# Patient Record
Sex: Female | Born: 1950
Health system: Southern US, Community
[De-identification: ages and names within clinical notes are randomized; demographics above are authoritative.]

## PROBLEM LIST (undated history)

## (undated) DIAGNOSIS — F32A Depression, unspecified: Secondary | ICD-10-CM

## (undated) DIAGNOSIS — I739 Peripheral vascular disease, unspecified: Secondary | ICD-10-CM

## (undated) DIAGNOSIS — I700 Atherosclerosis of aorta: Secondary | ICD-10-CM

## (undated) DIAGNOSIS — I10 Essential (primary) hypertension: Secondary | ICD-10-CM

## (undated) DIAGNOSIS — N139 Obstructive and reflux uropathy, unspecified: Secondary | ICD-10-CM

## (undated) DIAGNOSIS — G4730 Sleep apnea, unspecified: Secondary | ICD-10-CM

## (undated) DIAGNOSIS — I959 Hypotension, unspecified: Secondary | ICD-10-CM

## (undated) DIAGNOSIS — M419 Scoliosis, unspecified: Secondary | ICD-10-CM

## (undated) DIAGNOSIS — L97909 Non-pressure chronic ulcer of unspecified part of unspecified lower leg with unspecified severity: Principal | ICD-10-CM

## (undated) DIAGNOSIS — F329 Major depressive disorder, single episode, unspecified: Secondary | ICD-10-CM

## (undated) DIAGNOSIS — L98499 Non-pressure chronic ulcer of skin of other sites with unspecified severity: Secondary | ICD-10-CM

## (undated) DIAGNOSIS — R531 Weakness: Secondary | ICD-10-CM

## (undated) DIAGNOSIS — C8210 Follicular lymphoma grade II, unspecified site: Secondary | ICD-10-CM

## (undated) DIAGNOSIS — C8290 Follicular lymphoma, unspecified, unspecified site: Secondary | ICD-10-CM

## (undated) DIAGNOSIS — G709 Myoneural disorder, unspecified: Secondary | ICD-10-CM

## (undated) DIAGNOSIS — F909 Attention-deficit hyperactivity disorder, unspecified type: Secondary | ICD-10-CM

## (undated) DIAGNOSIS — G35 Multiple sclerosis: Secondary | ICD-10-CM

## (undated) DIAGNOSIS — J449 Chronic obstructive pulmonary disease, unspecified: Secondary | ICD-10-CM

## (undated) DIAGNOSIS — R609 Edema, unspecified: Secondary | ICD-10-CM

## (undated) DIAGNOSIS — R52 Pain, unspecified: Secondary | ICD-10-CM

## (undated) DIAGNOSIS — F419 Anxiety disorder, unspecified: Secondary | ICD-10-CM

## (undated) DIAGNOSIS — R0600 Dyspnea, unspecified: Secondary | ICD-10-CM

## (undated) HISTORY — DX: Scoliosis, unspecified: M41.9

## (undated) HISTORY — DX: Non-pressure chronic ulcer of unspecified part of unspecified lower leg with unspecified severity: L97.909

## (undated) HISTORY — PX: CYST EXCISION: SHX5701

## (undated) HISTORY — DX: Anxiety disorder, unspecified: F41.9

## (undated) HISTORY — DX: Atherosclerosis of aorta: I70.0

## (undated) HISTORY — DX: Follicular lymphoma grade II, unspecified site: C82.10

## (undated) HISTORY — DX: Chronic obstructive pulmonary disease, unspecified: J44.9

## (undated) HISTORY — DX: Follicular lymphoma, unspecified, unspecified site: C82.90

## (undated) HISTORY — DX: Major depressive disorder, single episode, unspecified: F32.9

## (undated) HISTORY — DX: Morbid (severe) obesity due to excess calories: E66.01

## (undated) HISTORY — PX: TONSILLECTOMY AND ADENOIDECTOMY: SUR1326

## (undated) HISTORY — DX: Depression, unspecified: F32.A

## (undated) HISTORY — PX: TUBAL LIGATION: SHX77

## (undated) HISTORY — DX: Essential (primary) hypertension: I10

## (undated) HISTORY — DX: Peripheral vascular disease, unspecified: I73.9

## (undated) HISTORY — DX: Myoneural disorder, unspecified: G70.9

## (undated) HISTORY — DX: Non-pressure chronic ulcer of skin of other sites with unspecified severity: L98.499

---

## 2000-04-29 HISTORY — PX: BACK SURGERY: SHX140

## 2000-12-23 ENCOUNTER — Inpatient Hospital Stay (HOSPITAL_COMMUNITY)
Admission: RE | Admit: 2000-12-23 | Discharge: 2000-12-26 | Payer: Self-pay | Admitting: Physical Medicine and Rehabilitation

## 2003-08-03 ENCOUNTER — Other Ambulatory Visit: Payer: Self-pay

## 2004-06-28 ENCOUNTER — Emergency Department: Payer: Self-pay | Admitting: Internal Medicine

## 2004-07-04 ENCOUNTER — Inpatient Hospital Stay: Payer: Self-pay

## 2006-08-21 ENCOUNTER — Emergency Department: Payer: Self-pay | Admitting: Emergency Medicine

## 2006-08-22 ENCOUNTER — Ambulatory Visit: Payer: Self-pay | Admitting: General Practice

## 2006-08-22 ENCOUNTER — Other Ambulatory Visit: Payer: Self-pay

## 2006-08-27 ENCOUNTER — Ambulatory Visit: Payer: Self-pay | Admitting: General Practice

## 2006-10-27 ENCOUNTER — Emergency Department: Payer: Self-pay | Admitting: Emergency Medicine

## 2006-10-30 ENCOUNTER — Encounter: Payer: Self-pay | Admitting: Internal Medicine

## 2006-11-28 ENCOUNTER — Encounter: Payer: Self-pay | Admitting: Internal Medicine

## 2006-12-29 ENCOUNTER — Encounter: Payer: Self-pay | Admitting: Internal Medicine

## 2007-01-28 ENCOUNTER — Encounter: Payer: Self-pay | Admitting: Internal Medicine

## 2007-06-05 ENCOUNTER — Ambulatory Visit: Payer: Self-pay | Admitting: Physician Assistant

## 2007-10-27 ENCOUNTER — Ambulatory Visit: Payer: Self-pay | Admitting: Internal Medicine

## 2008-01-25 ENCOUNTER — Ambulatory Visit: Payer: Self-pay

## 2008-03-09 ENCOUNTER — Encounter: Payer: Self-pay | Admitting: Internal Medicine

## 2008-03-29 ENCOUNTER — Encounter: Payer: Self-pay | Admitting: Internal Medicine

## 2008-11-17 ENCOUNTER — Ambulatory Visit: Payer: Self-pay | Admitting: Internal Medicine

## 2009-06-20 ENCOUNTER — Ambulatory Visit: Payer: Self-pay | Admitting: Urology

## 2010-01-10 ENCOUNTER — Ambulatory Visit: Payer: Self-pay | Admitting: Internal Medicine

## 2010-01-15 ENCOUNTER — Ambulatory Visit: Payer: Self-pay | Admitting: Internal Medicine

## 2010-01-17 ENCOUNTER — Ambulatory Visit: Payer: Self-pay | Admitting: Internal Medicine

## 2010-01-24 ENCOUNTER — Ambulatory Visit: Payer: Self-pay | Admitting: Internal Medicine

## 2010-01-31 ENCOUNTER — Ambulatory Visit: Payer: Self-pay | Admitting: Internal Medicine

## 2010-02-07 ENCOUNTER — Ambulatory Visit: Payer: Self-pay | Admitting: Internal Medicine

## 2010-02-15 ENCOUNTER — Ambulatory Visit: Payer: Self-pay

## 2010-02-21 ENCOUNTER — Ambulatory Visit: Payer: Self-pay | Admitting: Internal Medicine

## 2010-02-28 ENCOUNTER — Ambulatory Visit: Payer: Self-pay | Admitting: Internal Medicine

## 2010-03-09 ENCOUNTER — Ambulatory Visit: Payer: Self-pay | Admitting: Internal Medicine

## 2010-03-16 ENCOUNTER — Ambulatory Visit: Payer: Self-pay | Admitting: Internal Medicine

## 2010-03-21 ENCOUNTER — Ambulatory Visit: Payer: Self-pay

## 2010-03-29 ENCOUNTER — Ambulatory Visit: Payer: Self-pay | Admitting: Internal Medicine

## 2010-09-27 ENCOUNTER — Ambulatory Visit: Payer: Self-pay | Admitting: Urology

## 2010-12-03 ENCOUNTER — Ambulatory Visit: Payer: Self-pay | Admitting: Urology

## 2010-12-06 ENCOUNTER — Encounter: Payer: Self-pay | Admitting: Cardiothoracic Surgery

## 2010-12-06 ENCOUNTER — Encounter: Payer: Self-pay | Admitting: Nurse Practitioner

## 2010-12-29 ENCOUNTER — Encounter: Payer: Self-pay | Admitting: Cardiothoracic Surgery

## 2010-12-29 ENCOUNTER — Encounter: Payer: Self-pay | Admitting: Nurse Practitioner

## 2011-01-17 ENCOUNTER — Ambulatory Visit: Payer: Self-pay | Admitting: Internal Medicine

## 2011-01-28 ENCOUNTER — Encounter: Payer: Self-pay | Admitting: Cardiothoracic Surgery

## 2011-01-28 ENCOUNTER — Encounter: Payer: Self-pay | Admitting: Nurse Practitioner

## 2011-10-11 ENCOUNTER — Emergency Department: Payer: Self-pay | Admitting: Emergency Medicine

## 2011-10-11 ENCOUNTER — Ambulatory Visit: Payer: Self-pay | Admitting: Internal Medicine

## 2011-10-11 LAB — URINALYSIS, COMPLETE
Bacteria: NONE SEEN
Bacteria: NONE SEEN
Bilirubin,UR: NEGATIVE
Bilirubin,UR: NEGATIVE
Blood: NEGATIVE
Glucose,UR: NEGATIVE mg/dL (ref 0–75)
Glucose,UR: NEGATIVE mg/dL (ref 0–75)
Ketone: NEGATIVE
Ketone: NEGATIVE
Leukocyte Esterase: NEGATIVE
Leukocyte Esterase: NEGATIVE
Nitrite: NEGATIVE
Nitrite: NEGATIVE
Ph: 7 (ref 4.5–8.0)
Ph: 8 (ref 4.5–8.0)
Protein: NEGATIVE
Protein: 100
RBC,UR: <1 /HPF (ref 0–5)
RBC,UR: 10332 /HPF (ref 0–5)
Specific Gravity: 1.008 (ref 1.003–1.030)
Specific Gravity: 1.01 (ref 1.003–1.030)
Squamous Epithelial: 5
Squamous Epithelial: 12
WBC UR: 1 /HPF (ref 0–5)
WBC UR: NONE SEEN /HPF (ref 0–5)

## 2011-10-11 LAB — BASIC METABOLIC PANEL
Anion Gap: 7 (ref 7–16)
BUN: 31 mg/dL — ABNORMAL HIGH (ref 7–18)
Calcium, Total: 11.2 mg/dL — ABNORMAL HIGH (ref 8.5–10.1)
Chloride: 102 mmol/L (ref 98–107)
Co2: 30 mmol/L (ref 21–32)
Creatinine: 2.03 mg/dL — ABNORMAL HIGH (ref 0.60–1.30)
EGFR (African American): 30 — ABNORMAL LOW
EGFR (Non-African Amer.): 26 — ABNORMAL LOW
Glucose: 85 mg/dL (ref 65–99)
Osmolality: 283 (ref 275–301)
Potassium: 4.1 mmol/L (ref 3.5–5.1)
Sodium: 139 mmol/L (ref 136–145)

## 2011-10-11 LAB — CBC
HCT: 33.2 % — ABNORMAL LOW (ref 35.0–47.0)
HGB: 11 g/dL — ABNORMAL LOW (ref 12.0–16.0)
MCH: 29.7 pg (ref 26.0–34.0)
MCHC: 33.1 g/dL (ref 32.0–36.0)
MCV: 90 fL (ref 80–100)
Platelet: 206 10*3/uL (ref 150–440)
RBC: 3.7 10*6/uL — ABNORMAL LOW (ref 3.80–5.20)
RDW: 13.7 % (ref 11.5–14.5)
WBC: 7.5 10*3/uL (ref 3.6–11.0)

## 2011-11-26 DIAGNOSIS — M419 Scoliosis, unspecified: Secondary | ICD-10-CM | POA: Insufficient documentation

## 2011-11-26 DIAGNOSIS — O1003 Pre-existing essential hypertension complicating the puerperium: Secondary | ICD-10-CM | POA: Insufficient documentation

## 2011-11-26 DIAGNOSIS — M9979 Connective tissue and disc stenosis of intervertebral foramina of abdomen and other regions: Secondary | ICD-10-CM | POA: Insufficient documentation

## 2011-11-26 HISTORY — DX: Scoliosis, unspecified: M41.9

## 2012-01-10 ENCOUNTER — Emergency Department: Payer: Self-pay | Admitting: Emergency Medicine

## 2012-01-10 LAB — URINALYSIS, COMPLETE
Bacteria: NONE SEEN
Bilirubin,UR: NEGATIVE
Glucose,UR: NEGATIVE mg/dL (ref 0–75)
Ketone: NEGATIVE
Nitrite: NEGATIVE
Ph: 8 (ref 4.5–8.0)
Protein: NEGATIVE
RBC,UR: 2 /HPF (ref 0–5)
Specific Gravity: 1.004 (ref 1.003–1.030)
Squamous Epithelial: NONE SEEN
WBC UR: 7 /HPF (ref 0–5)

## 2012-01-13 ENCOUNTER — Ambulatory Visit: Payer: Self-pay | Admitting: Pain Medicine

## 2012-01-29 ENCOUNTER — Ambulatory Visit: Payer: Self-pay | Admitting: Pain Medicine

## 2012-02-12 DIAGNOSIS — N319 Neuromuscular dysfunction of bladder, unspecified: Secondary | ICD-10-CM | POA: Insufficient documentation

## 2012-02-12 DIAGNOSIS — N318 Other neuromuscular dysfunction of bladder: Secondary | ICD-10-CM | POA: Insufficient documentation

## 2012-02-12 DIAGNOSIS — R339 Retention of urine, unspecified: Secondary | ICD-10-CM | POA: Insufficient documentation

## 2012-02-20 ENCOUNTER — Ambulatory Visit: Payer: Self-pay | Admitting: Pain Medicine

## 2012-03-19 ENCOUNTER — Ambulatory Visit: Payer: Self-pay | Admitting: Pain Medicine

## 2012-04-01 ENCOUNTER — Ambulatory Visit: Payer: Self-pay | Admitting: Pain Medicine

## 2012-04-20 ENCOUNTER — Ambulatory Visit: Payer: Self-pay | Admitting: Pain Medicine

## 2012-05-01 ENCOUNTER — Ambulatory Visit: Payer: Self-pay | Admitting: Pain Medicine

## 2012-05-11 ENCOUNTER — Encounter: Payer: Self-pay | Admitting: Cardiothoracic Surgery

## 2012-05-11 ENCOUNTER — Encounter: Payer: Self-pay | Admitting: Nurse Practitioner

## 2012-05-13 ENCOUNTER — Ambulatory Visit: Payer: Self-pay | Admitting: Pain Medicine

## 2012-05-22 ENCOUNTER — Ambulatory Visit: Payer: Self-pay | Admitting: Neurology

## 2012-05-22 LAB — CREATININE, SERUM
Creatinine: 0.91 mg/dL (ref 0.60–1.30)
EGFR (African American): >60
EGFR (Non-African Amer.): >60

## 2012-05-30 ENCOUNTER — Encounter: Payer: Self-pay | Admitting: Cardiothoracic Surgery

## 2012-05-30 ENCOUNTER — Encounter: Payer: Self-pay | Admitting: Nurse Practitioner

## 2012-06-18 ENCOUNTER — Ambulatory Visit: Payer: Self-pay | Admitting: Pain Medicine

## 2012-06-27 ENCOUNTER — Encounter: Payer: Self-pay | Admitting: Nurse Practitioner

## 2012-06-29 ENCOUNTER — Ambulatory Visit: Payer: Self-pay | Admitting: Pain Medicine

## 2012-06-30 ENCOUNTER — Encounter: Payer: Self-pay | Admitting: Cardiothoracic Surgery

## 2012-07-23 ENCOUNTER — Ambulatory Visit: Payer: Self-pay | Admitting: Pain Medicine

## 2012-07-28 ENCOUNTER — Encounter: Payer: Self-pay | Admitting: Cardiothoracic Surgery

## 2012-07-28 ENCOUNTER — Encounter: Payer: Self-pay | Admitting: Nurse Practitioner

## 2012-08-03 ENCOUNTER — Ambulatory Visit: Payer: Self-pay | Admitting: Pain Medicine

## 2012-08-20 ENCOUNTER — Ambulatory Visit: Payer: Self-pay | Admitting: Pain Medicine

## 2012-08-27 ENCOUNTER — Encounter: Payer: Self-pay | Admitting: Nurse Practitioner

## 2012-08-27 ENCOUNTER — Encounter: Payer: Self-pay | Admitting: Cardiothoracic Surgery

## 2012-09-02 ENCOUNTER — Ambulatory Visit: Payer: Self-pay | Admitting: Pain Medicine

## 2012-09-17 ENCOUNTER — Ambulatory Visit: Payer: Self-pay | Admitting: Pain Medicine

## 2012-09-22 LAB — WOUND CULTURE

## 2012-09-25 DIAGNOSIS — R32 Unspecified urinary incontinence: Secondary | ICD-10-CM | POA: Insufficient documentation

## 2012-09-27 ENCOUNTER — Encounter: Payer: Self-pay | Admitting: Nurse Practitioner

## 2012-09-27 ENCOUNTER — Encounter: Payer: Self-pay | Admitting: Cardiothoracic Surgery

## 2012-10-07 ENCOUNTER — Ambulatory Visit: Payer: Self-pay | Admitting: Pain Medicine

## 2012-10-20 ENCOUNTER — Ambulatory Visit: Payer: Self-pay | Admitting: Pain Medicine

## 2012-10-27 ENCOUNTER — Encounter: Payer: Self-pay | Admitting: Nurse Practitioner

## 2012-10-27 ENCOUNTER — Encounter: Payer: Self-pay | Admitting: Cardiothoracic Surgery

## 2012-11-13 LAB — KOH PREP

## 2012-11-27 ENCOUNTER — Encounter: Payer: Self-pay | Admitting: Cardiothoracic Surgery

## 2012-11-27 ENCOUNTER — Encounter: Payer: Self-pay | Admitting: Nurse Practitioner

## 2013-02-25 ENCOUNTER — Inpatient Hospital Stay: Payer: Self-pay | Admitting: Internal Medicine

## 2013-02-25 LAB — URINALYSIS, COMPLETE
Bilirubin,UR: NEGATIVE
Blood: NEGATIVE
Glucose,UR: NEGATIVE mg/dL (ref 0–75)
Hyaline Cast: 4
Ketone: NEGATIVE
Nitrite: POSITIVE
Ph: 6 (ref 4.5–8.0)
Protein: 100
RBC,UR: 4 /HPF (ref 0–5)
Specific Gravity: 1.018 (ref 1.003–1.030)
Squamous Epithelial: 3
WBC UR: 411 /HPF (ref 0–5)

## 2013-02-25 LAB — COMPREHENSIVE METABOLIC PANEL
Albumin: 3.3 g/dL — ABNORMAL LOW (ref 3.4–5.0)
Alkaline Phosphatase: 88 U/L (ref 50–136)
Anion Gap: 6 — ABNORMAL LOW (ref 7–16)
BUN: 18 mg/dL (ref 7–18)
Bilirubin,Total: 0.4 mg/dL (ref 0.2–1.0)
Calcium, Total: 8.9 mg/dL (ref 8.5–10.1)
Chloride: 96 mmol/L — ABNORMAL LOW (ref 98–107)
Co2: 27 mmol/L (ref 21–32)
Creatinine: 1.62 mg/dL — ABNORMAL HIGH (ref 0.60–1.30)
EGFR (African American): 39 — ABNORMAL LOW
EGFR (Non-African Amer.): 34 — ABNORMAL LOW
Glucose: 119 mg/dL — ABNORMAL HIGH (ref 65–99)
Osmolality: 262 (ref 275–301)
Potassium: 3 mmol/L — ABNORMAL LOW (ref 3.5–5.1)
SGOT(AST): 20 U/L (ref 15–37)
SGPT (ALT): 18 U/L (ref 12–78)
Sodium: 129 mmol/L — ABNORMAL LOW (ref 136–145)
Total Protein: 7 g/dL (ref 6.4–8.2)

## 2013-02-25 LAB — CK TOTAL AND CKMB (NOT AT ARMC)
CK, Total: 142 U/L (ref 21–215)
CK-MB: 1.7 ng/mL (ref 0.5–3.6)

## 2013-02-25 LAB — CBC
HCT: 36 % (ref 35.0–47.0)
HGB: 12.3 g/dL (ref 12.0–16.0)
MCH: 29.5 pg (ref 26.0–34.0)
MCHC: 34.2 g/dL (ref 32.0–36.0)
MCV: 86 fL (ref 80–100)
Platelet: 204 10*3/uL (ref 150–440)
RBC: 4.18 10*6/uL (ref 3.80–5.20)
RDW: 14.5 % (ref 11.5–14.5)
WBC: 13 10*3/uL — ABNORMAL HIGH (ref 3.6–11.0)

## 2013-02-25 LAB — TROPONIN I: Troponin-I: <0.02 ng/mL

## 2013-02-26 LAB — BASIC METABOLIC PANEL
Anion Gap: 6 — ABNORMAL LOW (ref 7–16)
BUN: 18 mg/dL (ref 7–18)
Calcium, Total: 8.9 mg/dL (ref 8.5–10.1)
Chloride: 102 mmol/L (ref 98–107)
Co2: 24 mmol/L (ref 21–32)
Creatinine: 1.21 mg/dL (ref 0.60–1.30)
EGFR (African American): 56 — ABNORMAL LOW
EGFR (Non-African Amer.): 48 — ABNORMAL LOW
Glucose: 145 mg/dL — ABNORMAL HIGH (ref 65–99)
Osmolality: 269 (ref 275–301)
Potassium: 3.5 mmol/L (ref 3.5–5.1)
Sodium: 132 mmol/L — ABNORMAL LOW (ref 136–145)

## 2013-02-26 LAB — CBC WITH DIFFERENTIAL/PLATELET
Basophil #: 0 10*3/uL (ref 0.0–0.1)
Basophil %: 0.1 %
Eosinophil #: 0 10*3/uL (ref 0.0–0.7)
Eosinophil %: 0 %
HCT: 35.5 % (ref 35.0–47.0)
HGB: 12.3 g/dL (ref 12.0–16.0)
Lymphocyte #: 0.7 10*3/uL — ABNORMAL LOW (ref 1.0–3.6)
Lymphocyte %: 7.4 %
MCH: 29.8 pg (ref 26.0–34.0)
MCHC: 34.7 g/dL (ref 32.0–36.0)
MCV: 86 fL (ref 80–100)
Monocyte #: 0.1 x10 3/mm — ABNORMAL LOW (ref 0.2–0.9)
Monocyte %: 1.2 %
Neutrophil #: 8.3 10*3/uL — ABNORMAL HIGH (ref 1.4–6.5)
Neutrophil %: 91.3 %
Platelet: 204 10*3/uL (ref 150–440)
RBC: 4.13 10*6/uL (ref 3.80–5.20)
RDW: 14.5 % (ref 11.5–14.5)
WBC: 9.1 10*3/uL (ref 3.6–11.0)

## 2013-02-27 ENCOUNTER — Ambulatory Visit: Payer: Self-pay | Admitting: Neurology

## 2013-02-27 LAB — BASIC METABOLIC PANEL
Anion Gap: 7 (ref 7–16)
BUN: 23 mg/dL — ABNORMAL HIGH (ref 7–18)
Calcium, Total: 8.9 mg/dL (ref 8.5–10.1)
Chloride: 103 mmol/L (ref 98–107)
Co2: 23 mmol/L (ref 21–32)
Creatinine: 1.13 mg/dL (ref 0.60–1.30)
EGFR (African American): >60
EGFR (Non-African Amer.): 52 — ABNORMAL LOW
Glucose: 141 mg/dL — ABNORMAL HIGH (ref 65–99)
Osmolality: 272 (ref 275–301)
Potassium: 3.5 mmol/L (ref 3.5–5.1)
Sodium: 133 mmol/L — ABNORMAL LOW (ref 136–145)

## 2013-02-27 LAB — URINE CULTURE

## 2013-02-28 LAB — CBC WITH DIFFERENTIAL/PLATELET
Basophil #: 0.1 10*3/uL (ref 0.0–0.1)
Basophil %: 0.5 %
Eosinophil #: 0 10*3/uL (ref 0.0–0.7)
Eosinophil %: 0.1 %
HCT: 37.1 % (ref 35.0–47.0)
HGB: 12.8 g/dL (ref 12.0–16.0)
Lymphocyte #: 2.5 10*3/uL (ref 1.0–3.6)
Lymphocyte %: 22.6 %
MCH: 30 pg (ref 26.0–34.0)
MCHC: 34.4 g/dL (ref 32.0–36.0)
MCV: 87 fL (ref 80–100)
Monocyte #: 0.8 x10 3/mm (ref 0.2–0.9)
Monocyte %: 7.1 %
Neutrophil #: 7.8 10*3/uL — ABNORMAL HIGH (ref 1.4–6.5)
Neutrophil %: 69.7 %
Platelet: 247 10*3/uL (ref 150–440)
RBC: 4.26 10*6/uL (ref 3.80–5.20)
RDW: 14.8 % — ABNORMAL HIGH (ref 11.5–14.5)
WBC: 11.2 10*3/uL — ABNORMAL HIGH (ref 3.6–11.0)

## 2013-02-28 LAB — COMPREHENSIVE METABOLIC PANEL
Albumin: 3.1 g/dL — ABNORMAL LOW (ref 3.4–5.0)
Alkaline Phosphatase: 84 U/L (ref 50–136)
Anion Gap: 5 — ABNORMAL LOW (ref 7–16)
BUN: 22 mg/dL — ABNORMAL HIGH (ref 7–18)
Bilirubin,Total: 0.3 mg/dL (ref 0.2–1.0)
Calcium, Total: 8.7 mg/dL (ref 8.5–10.1)
Chloride: 105 mmol/L (ref 98–107)
Co2: 26 mmol/L (ref 21–32)
Creatinine: 1.08 mg/dL (ref 0.60–1.30)
EGFR (African American): >60
EGFR (Non-African Amer.): 55 — ABNORMAL LOW
Glucose: 74 mg/dL (ref 65–99)
Osmolality: 274 (ref 275–301)
Potassium: 3.4 mmol/L — ABNORMAL LOW (ref 3.5–5.1)
SGOT(AST): 10 U/L — ABNORMAL LOW (ref 15–37)
SGPT (ALT): 22 U/L (ref 12–78)
Sodium: 136 mmol/L (ref 136–145)
Total Protein: 6.8 g/dL (ref 6.4–8.2)

## 2013-05-07 ENCOUNTER — Encounter: Payer: Self-pay | Admitting: Surgery

## 2013-05-11 LAB — WOUND CULTURE

## 2013-05-30 ENCOUNTER — Encounter: Payer: Self-pay | Admitting: Surgery

## 2013-06-27 ENCOUNTER — Encounter: Payer: Self-pay | Admitting: Surgery

## 2013-07-14 ENCOUNTER — Emergency Department: Payer: Self-pay | Admitting: Internal Medicine

## 2013-07-14 LAB — CBC WITH DIFFERENTIAL/PLATELET
Basophil #: 0.1 10*3/uL (ref 0.0–0.1)
Basophil %: 1 %
Eosinophil #: 0.2 10*3/uL (ref 0.0–0.7)
Eosinophil %: 2.7 %
HCT: 40.8 % (ref 35.0–47.0)
HGB: 14.2 g/dL (ref 12.0–16.0)
Lymphocyte #: 1.5 10*3/uL (ref 1.0–3.6)
Lymphocyte %: 16.4 %
MCH: 30.2 pg (ref 26.0–34.0)
MCHC: 34.8 g/dL (ref 32.0–36.0)
MCV: 87 fL (ref 80–100)
Monocyte #: 0.7 x10 3/mm (ref 0.2–0.9)
Monocyte %: 8.4 %
Neutrophil #: 6.3 10*3/uL (ref 1.4–6.5)
Neutrophil %: 71.5 %
Platelet: 242 10*3/uL (ref 150–440)
RBC: 4.71 10*6/uL (ref 3.80–5.20)
RDW: 14.2 % (ref 11.5–14.5)
WBC: 8.9 10*3/uL (ref 3.6–11.0)

## 2013-07-14 LAB — BASIC METABOLIC PANEL
Anion Gap: 3 — ABNORMAL LOW (ref 7–16)
BUN: 13 mg/dL (ref 7–18)
Calcium, Total: 9.4 mg/dL (ref 8.5–10.1)
Chloride: 94 mmol/L — ABNORMAL LOW (ref 98–107)
Co2: 32 mmol/L (ref 21–32)
Creatinine: 1.03 mg/dL (ref 0.60–1.30)
EGFR (African American): >60
EGFR (Non-African Amer.): 58 — ABNORMAL LOW
Glucose: 91 mg/dL (ref 65–99)
Osmolality: 259 (ref 275–301)
Potassium: 3.5 mmol/L (ref 3.5–5.1)
Sodium: 129 mmol/L — ABNORMAL LOW (ref 136–145)

## 2013-08-01 ENCOUNTER — Emergency Department: Payer: Self-pay | Admitting: Emergency Medicine

## 2013-08-01 LAB — CBC
HCT: 40.2 % (ref 35.0–47.0)
HGB: 13.6 g/dL (ref 12.0–16.0)
MCH: 29.4 pg (ref 26.0–34.0)
MCHC: 33.8 g/dL (ref 32.0–36.0)
MCV: 87 fL (ref 80–100)
Platelet: 274 10*3/uL (ref 150–440)
RBC: 4.62 10*6/uL (ref 3.80–5.20)
RDW: 14.2 % (ref 11.5–14.5)
WBC: 9.2 10*3/uL (ref 3.6–11.0)

## 2013-08-01 LAB — COMPREHENSIVE METABOLIC PANEL
Albumin: 4.1 g/dL (ref 3.4–5.0)
Alkaline Phosphatase: 108 U/L
Anion Gap: 6 — ABNORMAL LOW (ref 7–16)
BUN: 15 mg/dL (ref 7–18)
Bilirubin,Total: 0.3 mg/dL (ref 0.2–1.0)
Calcium, Total: 8.9 mg/dL (ref 8.5–10.1)
Chloride: 90 mmol/L — ABNORMAL LOW (ref 98–107)
Co2: 29 mmol/L (ref 21–32)
Creatinine: 1.18 mg/dL (ref 0.60–1.30)
EGFR (African American): 57 — ABNORMAL LOW
EGFR (Non-African Amer.): 49 — ABNORMAL LOW
Glucose: 98 mg/dL (ref 65–99)
Osmolality: 252 (ref 275–301)
Potassium: 3.5 mmol/L (ref 3.5–5.1)
SGOT(AST): 28 U/L (ref 15–37)
SGPT (ALT): 26 U/L (ref 12–78)
Sodium: 125 mmol/L — ABNORMAL LOW (ref 136–145)
Total Protein: 7.6 g/dL (ref 6.4–8.2)

## 2013-08-01 LAB — CK: CK, Total: 311 U/L — ABNORMAL HIGH

## 2013-08-02 LAB — URINALYSIS, COMPLETE
Bilirubin,UR: NEGATIVE
Glucose,UR: NEGATIVE mg/dL (ref 0–75)
Hyaline Cast: 2
Ketone: NEGATIVE
Nitrite: POSITIVE
Ph: 5 (ref 4.5–8.0)
Protein: 30
RBC,UR: 69 /HPF (ref 0–5)
Specific Gravity: 1.012 (ref 1.003–1.030)
Squamous Epithelial: 1
WBC UR: 32 /HPF (ref 0–5)

## 2013-08-03 LAB — URINE CULTURE

## 2013-10-02 DIAGNOSIS — J441 Chronic obstructive pulmonary disease with (acute) exacerbation: Secondary | ICD-10-CM | POA: Insufficient documentation

## 2013-10-02 DIAGNOSIS — F3341 Major depressive disorder, recurrent, in partial remission: Secondary | ICD-10-CM | POA: Insufficient documentation

## 2013-10-02 DIAGNOSIS — I10 Essential (primary) hypertension: Secondary | ICD-10-CM | POA: Insufficient documentation

## 2013-10-02 DIAGNOSIS — M5416 Radiculopathy, lumbar region: Secondary | ICD-10-CM | POA: Insufficient documentation

## 2013-10-02 DIAGNOSIS — G35 Multiple sclerosis: Secondary | ICD-10-CM | POA: Insufficient documentation

## 2013-10-02 DIAGNOSIS — F325 Major depressive disorder, single episode, in full remission: Secondary | ICD-10-CM | POA: Insufficient documentation

## 2013-11-18 DIAGNOSIS — G4733 Obstructive sleep apnea (adult) (pediatric): Secondary | ICD-10-CM | POA: Insufficient documentation

## 2014-07-29 DIAGNOSIS — M1990 Unspecified osteoarthritis, unspecified site: Secondary | ICD-10-CM | POA: Diagnosis not present

## 2014-07-30 DIAGNOSIS — M1990 Unspecified osteoarthritis, unspecified site: Secondary | ICD-10-CM | POA: Diagnosis not present

## 2014-07-31 DIAGNOSIS — M1990 Unspecified osteoarthritis, unspecified site: Secondary | ICD-10-CM | POA: Diagnosis not present

## 2014-08-01 DIAGNOSIS — M1990 Unspecified osteoarthritis, unspecified site: Secondary | ICD-10-CM | POA: Diagnosis not present

## 2014-08-02 DIAGNOSIS — M1990 Unspecified osteoarthritis, unspecified site: Secondary | ICD-10-CM | POA: Diagnosis not present

## 2014-08-03 DIAGNOSIS — M1990 Unspecified osteoarthritis, unspecified site: Secondary | ICD-10-CM | POA: Diagnosis not present

## 2014-08-04 DIAGNOSIS — M1990 Unspecified osteoarthritis, unspecified site: Secondary | ICD-10-CM | POA: Diagnosis not present

## 2014-08-04 DIAGNOSIS — K029 Dental caries, unspecified: Secondary | ICD-10-CM | POA: Diagnosis not present

## 2014-08-05 DIAGNOSIS — M1990 Unspecified osteoarthritis, unspecified site: Secondary | ICD-10-CM | POA: Diagnosis not present

## 2014-08-06 DIAGNOSIS — M1990 Unspecified osteoarthritis, unspecified site: Secondary | ICD-10-CM | POA: Diagnosis not present

## 2014-08-07 DIAGNOSIS — M1990 Unspecified osteoarthritis, unspecified site: Secondary | ICD-10-CM | POA: Diagnosis not present

## 2014-08-08 DIAGNOSIS — M1990 Unspecified osteoarthritis, unspecified site: Secondary | ICD-10-CM | POA: Diagnosis not present

## 2014-08-09 DIAGNOSIS — M1990 Unspecified osteoarthritis, unspecified site: Secondary | ICD-10-CM | POA: Diagnosis not present

## 2014-08-10 DIAGNOSIS — M1990 Unspecified osteoarthritis, unspecified site: Secondary | ICD-10-CM | POA: Diagnosis not present

## 2014-08-11 DIAGNOSIS — Z466 Encounter for fitting and adjustment of urinary device: Secondary | ICD-10-CM | POA: Diagnosis not present

## 2014-08-11 DIAGNOSIS — I10 Essential (primary) hypertension: Secondary | ICD-10-CM | POA: Diagnosis not present

## 2014-08-11 DIAGNOSIS — R339 Retention of urine, unspecified: Secondary | ICD-10-CM | POA: Diagnosis not present

## 2014-08-11 DIAGNOSIS — Z9181 History of falling: Secondary | ICD-10-CM | POA: Diagnosis not present

## 2014-08-11 DIAGNOSIS — G35 Multiple sclerosis: Secondary | ICD-10-CM | POA: Diagnosis not present

## 2014-08-11 DIAGNOSIS — M539 Dorsopathy, unspecified: Secondary | ICD-10-CM | POA: Diagnosis not present

## 2014-08-11 DIAGNOSIS — N319 Neuromuscular dysfunction of bladder, unspecified: Secondary | ICD-10-CM | POA: Diagnosis not present

## 2014-08-11 DIAGNOSIS — M1990 Unspecified osteoarthritis, unspecified site: Secondary | ICD-10-CM | POA: Diagnosis not present

## 2014-08-12 DIAGNOSIS — M1990 Unspecified osteoarthritis, unspecified site: Secondary | ICD-10-CM | POA: Diagnosis not present

## 2014-08-13 DIAGNOSIS — M1990 Unspecified osteoarthritis, unspecified site: Secondary | ICD-10-CM | POA: Diagnosis not present

## 2014-08-14 DIAGNOSIS — M1990 Unspecified osteoarthritis, unspecified site: Secondary | ICD-10-CM | POA: Diagnosis not present

## 2014-08-15 DIAGNOSIS — M1990 Unspecified osteoarthritis, unspecified site: Secondary | ICD-10-CM | POA: Diagnosis not present

## 2014-08-16 DIAGNOSIS — M1990 Unspecified osteoarthritis, unspecified site: Secondary | ICD-10-CM | POA: Diagnosis not present

## 2014-08-17 DIAGNOSIS — M1990 Unspecified osteoarthritis, unspecified site: Secondary | ICD-10-CM | POA: Diagnosis not present

## 2014-08-18 DIAGNOSIS — M1990 Unspecified osteoarthritis, unspecified site: Secondary | ICD-10-CM | POA: Diagnosis not present

## 2014-08-19 DIAGNOSIS — M1990 Unspecified osteoarthritis, unspecified site: Secondary | ICD-10-CM | POA: Diagnosis not present

## 2014-08-19 NOTE — H&P (Signed)
PATIENT NAME:  [name], [name] MR#:  [id number] DATE OF BIRTH:  12-28-1950  DATE OF ADMISSION:  02/25/2013  PRIMARY CARE PHYSICIAN: Ocie Cornfield. Ouida Sills, MD  NEUROLOGIST: Hemang K. Manuella Ghazi, MD  CHIEF COMPLAINT: Frequent falls and weakness.   HISTORY OF PRESENT ILLNESS: This is a 64 year old female who presents to the hospital due to frequent falls and generalized weakness. The patient says that she has had about 6 falls this past week. She describes her falls as being that she attempts to walk and then her legs give way and she falls to the ground. She never really had any true syncope. She denies any head trauma. She denies any chest pain, palpitations, dizziness, any seizure-type activity or any other associated symptoms. Since her falls are becoming frequent, she came to the ER for further evaluation. The patient was noted to have a urinary tract infection and noted to be hypokalemic and hyponatremic and in acute renal failure. Hospitalist services were contacted for further treatment and evaluation. The patient does have a history of multiple sclerosis, and after speaking with the patient's neurologist, Dr. Manuella Ghazi, this possibly could be related to an MS flare, and therefore she is being is admitted for IV steroids and further evaluation.   REVIEW OF SYSTEMS:  CONSTITUTIONAL: No documented fever. No weight gain, no weight loss.  EYES: No blurry or double vision.  ENT: No tinnitus. No postnasal drip. No redness of the oropharynx.  RESPIRATORY: No cough, no wheeze, no hemoptysis, no dyspnea.  CARDIOVASCULAR: No chest pain. No orthopnea. No palpitations. No syncope.  GASTROINTESTINAL: No nausea, no vomiting, no diarrhea. No abdominal pain. No melena or hematochezia.  GENITOURINARY: No dysuria, no hematuria.  ENDOCRINE: No polyuria or nocturia. No heat or cold intolerance.  HEMATOLOGIC: No anemia, no bruising, no bleeding.  INTEGUMENTARY: No rashes, no lesions.  MUSCULOSKELETAL: No arthritis. No  swelling. No gout.  NEUROLOGIC: No numbness or tingling. No ataxia. No seizure-type activity. Positive MS.  PSYCHIATRIC: Positive depression. No anxiety. No ADD.   PAST MEDICAL HISTORY: Consistent with: 1. Multiple sclerosis.  2. Hypertension.  3. Depression.  4. COPD. 5. Urinary incontinence.   ALLERGIES: No known drug allergies.   SOCIAL HISTORY: Does have a 20- to 30-pack-year smoking history. No alcohol abuse. No illicit drug abuse. Lives by herself. Ambulates with the help of a walker.   FAMILY HISTORY: Both mother and father are deceased. Mother died from COPD. Father died from complications of old age.   CURRENT MEDICATIONS: As follows:  1. Bupropion 300 mg daily. 2. Cymbalta 60 mg daily. 3. Klonopin 1 mg b.i.d. 4. Flexeril 10 mg daily as needed. 5. HCTZ/triamterene 25/37.5 mg 1 tab daily.  6. Potassium 10 mEq daily. 7. Neurontin 600 mg t.i.d. 8. Seroquel 150 mg at bedtime. 9. Spiriva 1 puff daily.  10. Tizanidine 4 mg q.i.d. 11. Topamax 50 mg 1 tab b.i.d.  12. VESIcare 5 mg daily.   PHYSICAL EXAMINATION: Presently, is as follows:  VITAL SIGNS: Noted to be: Temperature is 98.5, pulse 63, respirations 18, blood pressure 100/52, saturation is 98% on room air.  GENERAL: She is a pleasant-appearing female in no apparent distress.  HEAD, EYES, EARS, NOSE AND THROAT: She is atraumatic, normocephalic. Extraocular muscles are intact. Pupils equal and reactive to light. Sclerae anicteric. No conjunctival injection. No pharyngeal erythema.  NECK: Supple. There is no jugular venous distention. No bruits. No lymphadenopathy or thyromegaly.  HEART: Regular rate and rhythm. No murmurs, no rubs, no clicks.  LUNGS: Clear  to auscultation bilaterally. No rales or rhonchi. No wheezes.  ABDOMEN: Soft, flat, nontender, nondistended. Has good bowel sounds. No hepatosplenomegaly appreciated.  EXTREMITIES: No evidence of any cyanosis, clubbing or peripheral edema. Has +2 pedal and radial pulses  bilaterally.  NEUROLOGICAL: The patient is alert, awake and oriented x3. The patient has about 4 out of 5 strength in the lower extremities bilaterally. No focal deficits appreciated in the upper extremities bilaterally. Babinski's are downgoing bilaterally. Reflexes are +2.  SKIN: Moist and warm with no rashes.  LYMPHATIC: There is no cervical or axillary lymphadenopathy.   LABORATORY DATA: Showed a serum glucose of 119, BUN 18, creatinine 1.6, sodium 129, potassium 3, chloride 96, bicarbonate 27. LFTs within normal limits. Troponin less than 0.02. White cell count 13, hemoglobin 12.3, hematocrit 36, platelet count of 204. Urinalysis shows positive nitrites and leukocyte esterase with 400 white cells.   ASSESSMENT AND PLAN: This is a 64 year old female with a history multiple sclerosis, hypertension, urinary incontinence, depression and chronic obstructive pulmonary disease, who presents to the hospital due to frequent falls and feeling weak. The patient was noted to have a urinary tract infection and also possible flare-up of multiple sclerosis.   1. Frequent falls/weakness. The exact etiology of this is unclear, whether this is related to multiple sclerosis flare-up versus urinary tract infection, dehydration, acute renal failure and hyponatremia. The patient's initial CT head is negative. I will hydrate the patient with IV fluids, give her IV ceftriaxone for her urinary tract infection and follow her clinically. Follow q.4 hour neuro checks. I will give her also low-dose IV steroids for possible MS flare. Will get a neurology consult. I discussed the case with Dr. Manuella Ghazi, who will see the patient. Will also get a physical therapy consult to assess her mobility.  2. Urinary tract infection. I will give her IV ceftriaxone. Follow urine cultures.  3. Hyponatremia. This was likely hypovolemic hyponatremia from dehydration and poor p.o. intake. I will hydrate her with IV fluids. Follow her sodium. Hold her  triamterene/HCTZ.  4. Acute renal failure, likely secondary to also dehydration. Will hydrate her with IV fluids, follow BUN and creatinine and urine output. Hold her HCTZ and triamterene for now. 5. Depression. Continue with the Cymbalta, Seroquel and Topamax.  6. Chronic obstructive pulmonary disease. No evidence of any acute exacerbation. Continue Spiriva.  7. Urinary incontinence. Continue VESIcare.   CODE STATUS: The patient is a full code.   The plan was discussed with the patient, and she is in agreement.   TIME SPENT: 50 minutes.     ____________________________ Belia Heman. Verdell Carmine, MD vjs:lb D: 02/25/2013 15:19:00 ET T: 02/25/2013 15:55:50 ET JOB#: [id number]  cc: Belia Heman. Verdell Carmine, MD, <Dictator> Henreitta Leber MD ELECTRONICALLY SIGNED 02/26/2013 14:36

## 2014-08-19 NOTE — Discharge Summary (Signed)
PATIENT NAME:  [name], [name] MR#:  [id number] DATE OF BIRTH:  1950-10-08  DATE OF ADMISSION:  02/25/2013 DATE OF DISCHARGE:  02/28/2013  REASON FOR ADMISSION: Weakness with frequent falls.   HISTORY OF PRESENT ILLNESS: Please see the dictated history of present illness done by Dr. Verdell Carmine on 02/25/2013.   PAST MEDICAL HISTORY: 1.  Multiple sclerosis.  2.  Benign hypertension.  3.  Depression.  4.  COPD.   5.  Urinary incontinence.   MEDICATIONS ON ADMISSION: Please see admission note.   ALLERGIES: No known drug allergies.   SOCIAL HISTORY:  As per admission note.  FAMILY HISTORY:  As per admission note.    REVIEW OF SYSTEMS: As per admission note.   PHYSICAL EXAM: GENERAL: The patient was in no acute distress.  VITAL SIGNS: Stable and she was afebrile.  HEENT: Exam was unremarkable.  NECK: Was supple without JVD.  LUNGS: Clear.  CARDIAC EXAM: Revealed a regular rate and rhythm. Normal S1, S2.  ABDOMEN: Soft and nontender.  EXTREMITIES: Without edema.  NEUROLOGIC: Exam was grossly nonfocal.   LABORATORY DATA: Revealed sodium 129 with BUN of 18, creatinine 1.6 with an abnormal urinalysis.   HOSPITAL COURSE: The patient was admitted with a UTI and dehydration with acute renal insufficiency. She was started on antibiotics and IV fluids with improvement of her symptoms. Because of her weakness, she was seen by neurology. MRI of the brain revealed a new lesion consistent with worsening MS. She was placed on IV steroids and subsequently converted over to oral steroids with continued strengthening. By 02/28/2013, the patient was stable, ambulating with a walker and requesting discharge. Discharge was okayed by neurology and she is now  discharged home for further care.   DISCHARGE DIAGNOSES: 1.  Acute renal insufficiency with dehydration.  2.  Urinary tract infection.  3.  Multiple sclerosis exacerbation.  4.  Hyponatremia.  5.  Depression.  6.  Chronic obstructive pulmonary  disease.  7.  Chronic urinary incontinence.   DISCHARGE MEDICATIONS: 1.  Topamax 50 mg p.o. b.i.d.  2.  Cymbalta 60 mg p.o. daily.  3.  Wellbutrin SR 300 mg p.o. daily.  4.  Seroquel XR 150 mg p.o. at bedtime.  5.  Spiriva 1 capsule inhaled daily.  6.  VESIcare 5 mg p.o. daily.  7.  Zanaflex 4 mg p.o. q.i.d.  8.  Neurontin 600 mg p.o. t.i.d.  9.  Clonazepam 1 mg p.o. b.i.d.  10.  Prednisone taper as directed.  11.  Ceftin 500 mg p.o. b.i.d. x 1 week.  12.  Colace 100 mg p.o. b.i.d.  13.  Dulcolax suppository 1 per rectum q.12h. p.r.n. constipation.   FOLLOWUP PLANS AND APPOINTMENTS:  1.  The patient was discharged on a low sodium diet.  2.  We will arrange for home health physical therapy.  3.  She will follow up with Dr. Manuella Ghazi of neurology later this week.  4.  She is to call if problems arise before then.   ____________________________ Leonie Douglas. Doy Hutching, MD jds:cs D: 02/28/2013 13:07:00 ET T: 02/28/2013 19:34:01 ET JOB#: [id number]  cc: Leonie Douglas. Doy Hutching, MD, <Dictator> Abbey Veith Lennice Sites MD ELECTRONICALLY SIGNED 03/01/2013 7:32
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
## 2014-08-19 NOTE — H&P (Signed)
PATIENT NAME:  [name], [name] MR#:  [id number] DATE OF BIRTH:  Apr 13, 1951  DATE OF ADMISSION:  02/25/2013  PRIMARY CARE PHYSICIAN: Ocie Cornfield. Ouida Sills, MD  NEUROLOGIST: Hemang K. Manuella Ghazi, MD  CHIEF COMPLAINT: Frequent falls and weakness.   HISTORY OF PRESENT ILLNESS: This is a 64 year old female who presents to the hospital due to frequent falls and generalized weakness. The patient says that she has had about 6 falls this past week. She describes her falls as being that she attempts to walk and then her legs give way and she falls to the ground. She never really had any true syncope. She denies any head trauma. She denies any chest pain, palpitations, dizziness, any seizure-type activity or any other associated symptoms. Since her falls are becoming frequent, she came to the ER for further evaluation. The patient was noted to have a urinary tract infection and noted to be hypokalemic and hyponatremic and in acute renal failure. Hospitalist services were contacted for further treatment and evaluation. The patient does have a history of multiple sclerosis, and after speaking with the patient's neurologist, Dr. Manuella Ghazi, this possibly could be related to an MS flare, and therefore she is being is admitted for IV steroids and further evaluation.   REVIEW OF SYSTEMS:  CONSTITUTIONAL: No documented fever. No weight gain, no weight loss.  EYES: No blurry or double vision.  ENT: No tinnitus. No postnasal drip. No redness of the oropharynx.  RESPIRATORY: No cough, no wheeze, no hemoptysis, no dyspnea.  CARDIOVASCULAR: No chest pain. No orthopnea. No palpitations. No syncope.  GASTROINTESTINAL: No nausea, no vomiting, no diarrhea. No abdominal pain. No melena or hematochezia.  GENITOURINARY: No dysuria, no hematuria.  ENDOCRINE: No polyuria or nocturia. No heat or cold intolerance.  HEMATOLOGIC: No anemia, no bruising, no bleeding.  INTEGUMENTARY: No rashes, no lesions.  MUSCULOSKELETAL: No arthritis. No  swelling. No gout.  NEUROLOGIC: No numbness or tingling. No ataxia. No seizure-type activity. Positive MS.  PSYCHIATRIC: Positive depression. No anxiety. No ADD.   PAST MEDICAL HISTORY: Consistent with: 1. Multiple sclerosis.  2. Hypertension.  3. Depression.  4. COPD. 5. Urinary incontinence.   ALLERGIES: No known drug allergies.   SOCIAL HISTORY: Does have a 20- to 30-pack-year smoking history. No alcohol abuse. No illicit drug abuse. Lives by herself. Ambulates with the help of a walker.   FAMILY HISTORY: Both mother and father are deceased. Mother died from COPD. Father died from complications of old age.   CURRENT MEDICATIONS: As follows:  1. Bupropion 300 mg daily. 2. Cymbalta 60 mg daily. 3. Klonopin 1 mg b.i.d. 4. Flexeril 10 mg daily as needed. 5. HCTZ/triamterene 25/37.5 mg 1 tab daily.  6. Potassium 10 mEq daily. 7. Neurontin 600 mg t.i.d. 8. Seroquel 150 mg at bedtime. 9. Spiriva 1 puff daily.  10. Tizanidine 4 mg q.i.d. 11. Topamax 50 mg 1 tab b.i.d.  12. VESIcare 5 mg daily.   PHYSICAL EXAMINATION: Presently, is as follows:  VITAL SIGNS: Noted to be: Temperature is 98.5, pulse 63, respirations 18, blood pressure 100/52, saturation is 98% on room air.  GENERAL: She is a pleasant-appearing female in no apparent distress.  HEAD, EYES, EARS, NOSE AND THROAT: She is atraumatic, normocephalic. Extraocular muscles are intact. Pupils equal and reactive to light. Sclerae anicteric. No conjunctival injection. No pharyngeal erythema.  NECK: Supple. There is no jugular venous distention. No bruits. No lymphadenopathy or thyromegaly.  HEART: Regular rate and rhythm. No murmurs, no rubs, no clicks.  LUNGS: Clear  to auscultation bilaterally. No rales or rhonchi. No wheezes.  ABDOMEN: Soft, flat, nontender, nondistended. Has good bowel sounds. No hepatosplenomegaly appreciated.  EXTREMITIES: No evidence of any cyanosis, clubbing or peripheral edema. Has +2 pedal and radial pulses  bilaterally.  NEUROLOGICAL: The patient is alert, awake and oriented x3. The patient has about 4 out of 5 strength in the lower extremities bilaterally. No focal deficits appreciated in the upper extremities bilaterally. Babinski's are downgoing bilaterally. Reflexes are +2.  SKIN: Moist and warm with no rashes.  LYMPHATIC: There is no cervical or axillary lymphadenopathy.   LABORATORY DATA: Showed a serum glucose of 119, BUN 18, creatinine 1.6, sodium 129, potassium 3, chloride 96, bicarbonate 27. LFTs within normal limits. Troponin less than 0.02. White cell count 13, hemoglobin 12.3, hematocrit 36, platelet count of 204. Urinalysis shows positive nitrites and leukocyte esterase with 400 white cells.   ASSESSMENT AND PLAN: This is a 64 year old female with a history multiple sclerosis, hypertension, urinary incontinence, depression and chronic obstructive pulmonary disease, who presents to the hospital due to frequent falls and feeling weak. The patient was noted to have a urinary tract infection and also possible flare-up of multiple sclerosis.   1. Frequent falls/weakness. The exact etiology of this is unclear, whether this is related to multiple sclerosis flare-up versus urinary tract infection, dehydration, acute renal failure and hyponatremia. The patient's initial CT head is negative. I will hydrate the patient with IV fluids, give her IV ceftriaxone for her urinary tract infection and follow her clinically. Follow q.4 hour neuro checks. I will give her also low-dose IV steroids for possible MS flare. Will get a neurology consult. I discussed the case with Dr. Manuella Ghazi, who will see the patient. Will also get a physical therapy consult to assess her mobility.  2. Urinary tract infection. I will give her IV ceftriaxone. Follow urine cultures.  3. Hyponatremia. This was likely hypovolemic hyponatremia from dehydration and poor p.o. intake. I will hydrate her with IV fluids. Follow her sodium. Hold her  triamterene/HCTZ.  4. Acute renal failure, likely secondary to also dehydration. Will hydrate her with IV fluids, follow BUN and creatinine and urine output. Hold her HCTZ and triamterene for now. 5. Depression. Continue with the Cymbalta, Seroquel and Topamax.  6. Chronic obstructive pulmonary disease. No evidence of any acute exacerbation. Continue Spiriva.  7. Urinary incontinence. Continue VESIcare.   CODE STATUS: The patient is a full code.   The plan was discussed with the patient, and she is in agreement.   TIME SPENT: 50 minutes.   ____________________________ Belia Heman. Verdell Carmine, MD vjs:lb D: 02/25/2013 15:19:00 ET T: 02/25/2013 15:54:04 ET JOB#: [id number]  cc: Belia Heman. Verdell Carmine, MD, <Dictator> Henreitta Leber MD ELECTRONICALLY SIGNED 02/26/2013 14:36

## 2014-08-20 DIAGNOSIS — M1990 Unspecified osteoarthritis, unspecified site: Secondary | ICD-10-CM | POA: Diagnosis not present

## 2014-08-21 DIAGNOSIS — M1990 Unspecified osteoarthritis, unspecified site: Secondary | ICD-10-CM | POA: Diagnosis not present

## 2014-08-22 DIAGNOSIS — M1990 Unspecified osteoarthritis, unspecified site: Secondary | ICD-10-CM | POA: Diagnosis not present

## 2014-08-23 DIAGNOSIS — M1990 Unspecified osteoarthritis, unspecified site: Secondary | ICD-10-CM | POA: Diagnosis not present

## 2014-08-24 DIAGNOSIS — J441 Chronic obstructive pulmonary disease with (acute) exacerbation: Secondary | ICD-10-CM | POA: Diagnosis not present

## 2014-08-24 DIAGNOSIS — Z716 Tobacco abuse counseling: Secondary | ICD-10-CM | POA: Diagnosis not present

## 2014-08-24 DIAGNOSIS — I739 Peripheral vascular disease, unspecified: Secondary | ICD-10-CM | POA: Diagnosis not present

## 2014-08-24 DIAGNOSIS — M1990 Unspecified osteoarthritis, unspecified site: Secondary | ICD-10-CM | POA: Diagnosis not present

## 2014-08-24 DIAGNOSIS — R238 Other skin changes: Secondary | ICD-10-CM | POA: Diagnosis not present

## 2014-08-25 DIAGNOSIS — M1990 Unspecified osteoarthritis, unspecified site: Secondary | ICD-10-CM | POA: Diagnosis not present

## 2014-08-26 DIAGNOSIS — M1990 Unspecified osteoarthritis, unspecified site: Secondary | ICD-10-CM | POA: Diagnosis not present

## 2014-08-27 DIAGNOSIS — M1990 Unspecified osteoarthritis, unspecified site: Secondary | ICD-10-CM | POA: Diagnosis not present

## 2014-08-28 DIAGNOSIS — M1990 Unspecified osteoarthritis, unspecified site: Secondary | ICD-10-CM | POA: Diagnosis not present

## 2014-08-29 DIAGNOSIS — M1990 Unspecified osteoarthritis, unspecified site: Secondary | ICD-10-CM | POA: Diagnosis not present

## 2014-08-30 DIAGNOSIS — M1990 Unspecified osteoarthritis, unspecified site: Secondary | ICD-10-CM | POA: Diagnosis not present

## 2014-08-31 DIAGNOSIS — M1990 Unspecified osteoarthritis, unspecified site: Secondary | ICD-10-CM | POA: Diagnosis not present

## 2014-09-01 DIAGNOSIS — M1990 Unspecified osteoarthritis, unspecified site: Secondary | ICD-10-CM | POA: Diagnosis not present

## 2014-09-02 DIAGNOSIS — M1990 Unspecified osteoarthritis, unspecified site: Secondary | ICD-10-CM | POA: Diagnosis not present

## 2014-09-03 DIAGNOSIS — M1990 Unspecified osteoarthritis, unspecified site: Secondary | ICD-10-CM | POA: Diagnosis not present

## 2014-09-04 DIAGNOSIS — M1990 Unspecified osteoarthritis, unspecified site: Secondary | ICD-10-CM | POA: Diagnosis not present

## 2014-09-05 DIAGNOSIS — M1990 Unspecified osteoarthritis, unspecified site: Secondary | ICD-10-CM | POA: Diagnosis not present

## 2014-09-06 DIAGNOSIS — M1990 Unspecified osteoarthritis, unspecified site: Secondary | ICD-10-CM | POA: Diagnosis not present

## 2014-09-07 DIAGNOSIS — M1990 Unspecified osteoarthritis, unspecified site: Secondary | ICD-10-CM | POA: Diagnosis not present

## 2014-09-08 DIAGNOSIS — M1990 Unspecified osteoarthritis, unspecified site: Secondary | ICD-10-CM | POA: Diagnosis not present

## 2014-09-09 DIAGNOSIS — M1990 Unspecified osteoarthritis, unspecified site: Secondary | ICD-10-CM | POA: Diagnosis not present

## 2014-09-10 DIAGNOSIS — M1990 Unspecified osteoarthritis, unspecified site: Secondary | ICD-10-CM | POA: Diagnosis not present

## 2014-09-11 DIAGNOSIS — M1990 Unspecified osteoarthritis, unspecified site: Secondary | ICD-10-CM | POA: Diagnosis not present

## 2014-09-12 DIAGNOSIS — R339 Retention of urine, unspecified: Secondary | ICD-10-CM | POA: Diagnosis not present

## 2014-09-12 DIAGNOSIS — N319 Neuromuscular dysfunction of bladder, unspecified: Secondary | ICD-10-CM | POA: Diagnosis not present

## 2014-09-12 DIAGNOSIS — Z466 Encounter for fitting and adjustment of urinary device: Secondary | ICD-10-CM | POA: Diagnosis not present

## 2014-09-12 DIAGNOSIS — I10 Essential (primary) hypertension: Secondary | ICD-10-CM | POA: Diagnosis not present

## 2014-09-12 DIAGNOSIS — M539 Dorsopathy, unspecified: Secondary | ICD-10-CM | POA: Diagnosis not present

## 2014-09-12 DIAGNOSIS — G35 Multiple sclerosis: Secondary | ICD-10-CM | POA: Diagnosis not present

## 2014-09-12 DIAGNOSIS — M1990 Unspecified osteoarthritis, unspecified site: Secondary | ICD-10-CM | POA: Diagnosis not present

## 2014-09-12 DIAGNOSIS — Z9181 History of falling: Secondary | ICD-10-CM | POA: Diagnosis not present

## 2014-09-13 DIAGNOSIS — M1990 Unspecified osteoarthritis, unspecified site: Secondary | ICD-10-CM | POA: Diagnosis not present

## 2014-09-14 DIAGNOSIS — M1990 Unspecified osteoarthritis, unspecified site: Secondary | ICD-10-CM | POA: Diagnosis not present

## 2014-09-15 DIAGNOSIS — M1990 Unspecified osteoarthritis, unspecified site: Secondary | ICD-10-CM | POA: Diagnosis not present

## 2014-09-16 DIAGNOSIS — M1990 Unspecified osteoarthritis, unspecified site: Secondary | ICD-10-CM | POA: Diagnosis not present

## 2014-09-17 DIAGNOSIS — M1990 Unspecified osteoarthritis, unspecified site: Secondary | ICD-10-CM | POA: Diagnosis not present

## 2014-09-19 DIAGNOSIS — M1990 Unspecified osteoarthritis, unspecified site: Secondary | ICD-10-CM | POA: Diagnosis not present

## 2014-09-21 DIAGNOSIS — M1990 Unspecified osteoarthritis, unspecified site: Secondary | ICD-10-CM | POA: Diagnosis not present

## 2014-09-22 DIAGNOSIS — M1990 Unspecified osteoarthritis, unspecified site: Secondary | ICD-10-CM | POA: Diagnosis not present

## 2014-09-23 DIAGNOSIS — M1990 Unspecified osteoarthritis, unspecified site: Secondary | ICD-10-CM | POA: Diagnosis not present

## 2014-09-24 DIAGNOSIS — M1990 Unspecified osteoarthritis, unspecified site: Secondary | ICD-10-CM | POA: Diagnosis not present

## 2014-09-25 DIAGNOSIS — M1990 Unspecified osteoarthritis, unspecified site: Secondary | ICD-10-CM | POA: Diagnosis not present

## 2014-09-26 DIAGNOSIS — M1990 Unspecified osteoarthritis, unspecified site: Secondary | ICD-10-CM | POA: Diagnosis not present

## 2014-09-27 DIAGNOSIS — M1990 Unspecified osteoarthritis, unspecified site: Secondary | ICD-10-CM | POA: Diagnosis not present

## 2014-09-27 DIAGNOSIS — J449 Chronic obstructive pulmonary disease, unspecified: Secondary | ICD-10-CM | POA: Diagnosis not present

## 2014-09-27 DIAGNOSIS — J441 Chronic obstructive pulmonary disease with (acute) exacerbation: Secondary | ICD-10-CM | POA: Diagnosis not present

## 2014-09-28 DIAGNOSIS — M1990 Unspecified osteoarthritis, unspecified site: Secondary | ICD-10-CM | POA: Diagnosis not present

## 2014-09-29 DIAGNOSIS — M1990 Unspecified osteoarthritis, unspecified site: Secondary | ICD-10-CM | POA: Diagnosis not present

## 2014-09-30 ENCOUNTER — Other Ambulatory Visit: Payer: Self-pay | Admitting: Family Medicine

## 2014-09-30 DIAGNOSIS — M1990 Unspecified osteoarthritis, unspecified site: Secondary | ICD-10-CM | POA: Diagnosis not present

## 2014-09-30 DIAGNOSIS — J209 Acute bronchitis, unspecified: Secondary | ICD-10-CM

## 2014-09-30 DIAGNOSIS — J440 Chronic obstructive pulmonary disease with acute lower respiratory infection: Principal | ICD-10-CM

## 2014-10-03 DIAGNOSIS — M1990 Unspecified osteoarthritis, unspecified site: Secondary | ICD-10-CM | POA: Diagnosis not present

## 2014-10-04 DIAGNOSIS — M1990 Unspecified osteoarthritis, unspecified site: Secondary | ICD-10-CM | POA: Diagnosis not present

## 2014-10-05 DIAGNOSIS — R339 Retention of urine, unspecified: Secondary | ICD-10-CM | POA: Diagnosis not present

## 2014-10-05 DIAGNOSIS — I10 Essential (primary) hypertension: Secondary | ICD-10-CM | POA: Diagnosis not present

## 2014-10-05 DIAGNOSIS — Z466 Encounter for fitting and adjustment of urinary device: Secondary | ICD-10-CM | POA: Diagnosis not present

## 2014-10-05 DIAGNOSIS — M1990 Unspecified osteoarthritis, unspecified site: Secondary | ICD-10-CM | POA: Diagnosis not present

## 2014-10-05 DIAGNOSIS — Z9181 History of falling: Secondary | ICD-10-CM | POA: Diagnosis not present

## 2014-10-05 DIAGNOSIS — N319 Neuromuscular dysfunction of bladder, unspecified: Secondary | ICD-10-CM | POA: Diagnosis not present

## 2014-10-05 DIAGNOSIS — M539 Dorsopathy, unspecified: Secondary | ICD-10-CM | POA: Diagnosis not present

## 2014-10-05 DIAGNOSIS — G35 Multiple sclerosis: Secondary | ICD-10-CM | POA: Diagnosis not present

## 2014-10-06 ENCOUNTER — Other Ambulatory Visit: Payer: Self-pay

## 2014-10-06 DIAGNOSIS — M1990 Unspecified osteoarthritis, unspecified site: Secondary | ICD-10-CM | POA: Diagnosis not present

## 2014-10-06 MED ORDER — IPRATROPIUM-ALBUTEROL 20-100 MCG/ACT IN AERS: 1.0000 | INHALATION_SPRAY | Freq: Four times a day (QID) | RESPIRATORY_TRACT | Status: DC | PRN

## 2014-10-07 ENCOUNTER — Telehealth: Payer: Self-pay

## 2014-10-07 DIAGNOSIS — M1990 Unspecified osteoarthritis, unspecified site: Secondary | ICD-10-CM | POA: Diagnosis not present

## 2014-10-07 NOTE — Telephone Encounter (Signed)
Pt needs a refill on vesicare?

## 2014-10-10 DIAGNOSIS — M1990 Unspecified osteoarthritis, unspecified site: Secondary | ICD-10-CM | POA: Diagnosis not present

## 2014-10-11 DIAGNOSIS — M1990 Unspecified osteoarthritis, unspecified site: Secondary | ICD-10-CM | POA: Diagnosis not present

## 2014-10-12 DIAGNOSIS — M1990 Unspecified osteoarthritis, unspecified site: Secondary | ICD-10-CM | POA: Diagnosis not present

## 2014-10-13 DIAGNOSIS — M1990 Unspecified osteoarthritis, unspecified site: Secondary | ICD-10-CM | POA: Diagnosis not present

## 2014-10-14 DIAGNOSIS — M1990 Unspecified osteoarthritis, unspecified site: Secondary | ICD-10-CM | POA: Diagnosis not present

## 2014-10-18 DIAGNOSIS — M1990 Unspecified osteoarthritis, unspecified site: Secondary | ICD-10-CM | POA: Diagnosis not present

## 2014-10-19 DIAGNOSIS — M1990 Unspecified osteoarthritis, unspecified site: Secondary | ICD-10-CM | POA: Diagnosis not present

## 2014-10-20 DIAGNOSIS — M1990 Unspecified osteoarthritis, unspecified site: Secondary | ICD-10-CM | POA: Diagnosis not present

## 2014-10-21 DIAGNOSIS — M1990 Unspecified osteoarthritis, unspecified site: Secondary | ICD-10-CM | POA: Diagnosis not present

## 2014-10-24 ENCOUNTER — Encounter: Payer: Self-pay | Admitting: Family Medicine

## 2014-10-24 ENCOUNTER — Ambulatory Visit (INDEPENDENT_AMBULATORY_CARE_PROVIDER_SITE_OTHER): Payer: Medicare Other | Admitting: Family Medicine

## 2014-10-24 VITALS — BP 128/82 | HR 65 | Temp 97.8°F | Resp 16 | Ht 65.0 in

## 2014-10-24 DIAGNOSIS — K0510 Chronic gingivitis, plaque induced: Secondary | ICD-10-CM

## 2014-10-24 DIAGNOSIS — N312 Flaccid neuropathic bladder, not elsewhere classified: Secondary | ICD-10-CM | POA: Insufficient documentation

## 2014-10-24 DIAGNOSIS — G43909 Migraine, unspecified, not intractable, without status migrainosus: Secondary | ICD-10-CM | POA: Insufficient documentation

## 2014-10-24 DIAGNOSIS — M1990 Unspecified osteoarthritis, unspecified site: Secondary | ICD-10-CM | POA: Diagnosis not present

## 2014-10-24 DIAGNOSIS — Z720 Tobacco use: Secondary | ICD-10-CM | POA: Insufficient documentation

## 2014-10-24 DIAGNOSIS — I10 Essential (primary) hypertension: Secondary | ICD-10-CM | POA: Diagnosis not present

## 2014-10-24 DIAGNOSIS — G35 Multiple sclerosis: Secondary | ICD-10-CM | POA: Insufficient documentation

## 2014-10-24 DIAGNOSIS — N319 Neuromuscular dysfunction of bladder, unspecified: Secondary | ICD-10-CM | POA: Insufficient documentation

## 2014-10-24 MED ORDER — TRIAMTERENE-HCTZ 37.5-25 MG PO CAPS: 1.0000 | ORAL_CAPSULE | Freq: Every day | ORAL | Status: DC

## 2014-10-24 MED ORDER — AMOXICILLIN-POT CLAVULANATE 875-125 MG PO TABS: 1.0000 | ORAL_TABLET | Freq: Two times a day (BID) | ORAL | Status: DC

## 2014-10-24 NOTE — Patient Instructions (Signed)
Gingivitis °Gingivitis is a form of gum (periodontal) disease that causes redness, soreness, and swelling (inflammation) of your gums. °CAUSES °The most common cause of gingivitis is poor oral hygiene. A sticky substance made of bacteria, mucus, and food particles (plaque), is deposited on the exposed part of teeth. As plaque builds up, it reacts with the saliva in your mouth to form something called  tartar. Tartar is a hard deposit that becomes trapped around the base of the tooth. Plaque and tartar irritate the gums, leading to the formation of gingivitis. Other factors that increase your risk for gingivitis include:  °· Tobacco use. °· Diabetes. °· Older age. °· Certain medications. °· Certain viral or fungal infections. °· Dry mouth. °· Hormonal changes such as during pregnancy. °· Poor nutrition. °· Substance abuse. °· Poor fitting dental restorations or appliances. °SYMPTOMS °You may notice inflammation of the soft tissue (gingiva) around the teeth. When these tissues become inflamed, they bleed easily, especially during flossing or brushing. The gums may also be:  °· Tender to the touch. °· Bright red, purple red, or have a shiny appearance. °· Swollen. °· Wearing away from the teeth (receding), which exposes more of the tooth. °Bad breath is often present. Continued infection around teeth can eventually cause cavities and loosen teeth. This may lead to eventual tooth loss. °DIAGNOSIS °A medical and dental history will be taken. Your mouth, teeth, and gums will be examined. Your dentist will look for soft, swollen purple-red, irritated gums. There may be deposits of plaque and tartar at the base of the teeth. Your gums will be looked at for the degree of redness, puffiness, and bleeding tendencies. Your dentist will see if any of the teeth are loose. X-rays may be taken to see if the inflammation has spread to the supporting structures of the teeth. °TREATMENT °The goal is to reduce and reverse the  inflammation. Proper treatment can usually reverse the symptoms of gingivitis and prevent further progression of the disease. Have your teeth cleaned. During the cleaning, all plaque and tartar will be removed. Instruction for proper home care will be given. You will need regular professional cleanings and check-ups in the future. °HOME CARE INSTRUCTIONS °· Brush your teeth twice a day and floss at least once per day. When flossing, it is best to floss first then brush. °· Limit sugar between meals and maintain a well-balanced diet.  °· Even the best dental hygiene will not prevent plaque from developing. It is necessary for you to see your dentist on a regular basis for cleaning and regular checkups. °· Your dentist can recommend proper oral hygiene and mouth care and suggest special toothpastes or mouth rinses. °· Stop smoking. °SEEK DENTAL OR MEDICAL CARE IF: °· You have painful, reddened tissue around your teeth, or you have puffy swollen gums. °· You have difficulty chewing. °· You notice any loose or infected teeth. °· You have swollen glands. °· Your gums bleed easily when you brush your teeth or are very tender to the touch. °Document Released: 10/09/2000 Document Revised: 07/08/2011 Document Reviewed: 07/20/2010 °Exit[employer]® Patient Information ©2015 [employer]. This information is not intended to replace advice given to you by your health care provider. Make sure you discuss any questions you have with your health care provider. ° °

## 2014-10-24 NOTE — Progress Notes (Signed)
Name: [name]   MRN: [id number]    DOB: July 12, 1950   Date:10/24/2014       Progress Note  Subjective  Chief Complaint  Chief Complaint  Patient presents with  . Abscess    patient stated the a tooth on the left lower side has broken off. patient stated that it started this past Thursday.    HPI  Tooth pain: Patient is here today with concerns regarding the following symptoms tooth pain on and off left lower side of mouth. Not associated with fevers, chills, headaches worse than usual, altered mentation. Has been working with dentist regarding ongoing issue with teeth and gums.   Hypertension: Patient here for follow-up of Hypertention. She is not exercising and is not adherent to low salt diet.  Blood pressure is well controlled at home. Cardiac symptoms none. Patient denies chest pain, irregular heart beat, lower extremity edema, near-syncope and palpitations.  Cardiovascular risk factors: advanced age (older than 2 for men, 36 for women), hypertension, obesity (BMI >= 30 kg/m2), sedentary lifestyle and smoking/ tobacco exposure. Use of agents associated with hypertension: amphetamines. History of target organ damage: work up pending.    Past Medical History  Diagnosis Date  . Anxiety   . COPD (chronic obstructive pulmonary disease)   . Depression   . Hypertension   . Neuromuscular disorder    Past Surgical History  Procedure Laterality Date  . Back surgery N/A 2002  . Tonsillectomy and adenoidectomy    . Tubal ligation     Family History  Problem Relation Age of Onset  . COPD Mother   . Diabetes Mother   . Alcohol abuse Father   . Kidney disease Father     History  Substance Use Topics  . Smoking status: Current Some Day Smoker    Types: Cigarettes  . Smokeless tobacco: Not on file  . Alcohol Use: No     Current outpatient prescriptions:  .  buPROPion (BUDEPRION XL) 300 MG 24 hr tablet, Take 1 tablet by mouth daily., Disp: , Rfl:  .  clonazePAM (KLONOPIN) 1  MG tablet, Take 1 tablet by mouth 2 (two) times daily as needed., Disp: , Rfl:  .  cyclobenzaprine (FLEXERIL) 10 MG tablet, Take 1 tablet by mouth 2 (two) times daily as needed., Disp: , Rfl:  .  dalfampridine (AMPYRA) 10 MG TB12, Take 1 tablet by mouth 2 (two) times daily., Disp: , Rfl:  .  DULoxetine (CYMBALTA) 60 MG capsule, Take 1 capsule by mouth daily., Disp: , Rfl:  .  gabapentin (NEURONTIN) 600 MG tablet, Take 1 tablet by mouth 3 (three) times daily., Disp: , Rfl:  .  Interferon Beta-1a (AVONEX PEN) 30 MCG/0.5ML AJKT, Inject 0.5 mLs into the skin once a week., Disp: , Rfl:  .  potassium chloride (K-DUR) 10 MEQ tablet, Take 1 tablet by mouth daily., Disp: , Rfl:  .  QUEtiapine Fumarate (SEROQUEL XR) 150 MG 24 hr tablet, Take 1 tablet by mouth at bedtime., Disp: , Rfl:  .  topiramate (TOPAMAX) 50 MG tablet, Take 1 tablet by mouth 2 (two) times daily., Disp: , Rfl:  .  triamterene-hydrochlorothiazide (DYAZIDE) 37.5-25 MG per capsule, Take 1 each (1 capsule total) by mouth daily., Disp: 90 capsule, Rfl: 2 .  amoxicillin-clavulanate (AUGMENTIN) 875-125 MG per tablet, Take 1 tablet by mouth 2 (two) times daily., Disp: 20 tablet, Rfl: 0 .  Calcium Carbonate-Vitamin D (CALCIUM 500 + D) 500-125 MG-UNIT TABS, Take 1 tablet by mouth daily.,  Disp: , Rfl:  .  Ipratropium-Albuterol (COMBIVENT) 20-100 MCG/ACT AERS respimat, Inhale 1 puff into the lungs every 6 (six) hours as needed for wheezing or shortness of breath., Disp: 1 Inhaler, Rfl: 2 .  Multiple Vitamin (MULTI-VITAMINS) TABS, Take 1 tablet by mouth daily., Disp: , Rfl:  .  solifenacin (VESICARE) 5 MG tablet, Take 5 mg by mouth daily., Disp: , Rfl:   No Known Allergies  ROS  CONSTITUTIONAL: No significant weight changes, fever, chills, weakness or fatigue.  HEENT:  - Eyes: No visual changes.  - Ears: No auditory changes. No pain.  - Nose: No sneezing, congestion, runny nose. - Throat: No sore throat. No changes in swallowing. - Dental: Yes  tooth pain SKIN: No rash or itching.  CARDIOVASCULAR: No chest pain, chest pressure or chest discomfort. No palpitations or edema.  RESPIRATORY: No shortness of breath, cough or sputum.  GASTROINTESTINAL: No anorexia, nausea, vomiting. No changes in bowel habits. No abdominal pain or blood.  GENITOURINARY: No dysuria. No frequency. No discharge.  NEUROLOGICAL: No headache, dizziness, syncope. No memory changes. No change in bowel or bladder control.  MUSCULOSKELETAL: Chronic joint pain. No muscle pain. HEMATOLOGIC: No anemia, bleeding or bruising.  LYMPHATICS: No enlarged lymph nodes.  PSYCHIATRIC: No change in mood. No change in sleep pattern.  ENDOCRINOLOGIC: No reports of sweating, cold or heat intolerance. No polyuria or polydipsia.    Objective  Filed Vitals:   10/24/14 1545  BP: 128/82  Pulse: 65  Temp: 97.8 F (36.6 C)  TempSrc: Oral  Resp: 16  Height: 5\' 5"  (1.651 m)  SpO2: 96%     Physical Exam  Constitutional: Patient is obese and well-nourished. In no acute distress. In a wheel chair as usual today with her urinary cath tubing and bag at her side. HEENT:  - Head: Normocephalic and atraumatic.  - Ears: RIGHT TM bulging with minimal clear exudate, LEFT TM bulging with minimal clear exudate.  - Nose: Nasal mucosa boggy and congested.  - Mouth/Throat: Oropharynx is moist with no erythema of bilateral tonsils without hypertrophy or exudates. Teeth are missing, discolored, loose with some mild redness and tenderness left lower gum line. No overt abscess or purulent drainage. No jaw pain on palpation. No cervical lymphadenopathy. - Eyes: Conjunctivae clear, EOM movements normal. PERRLA. No scleral icterus.  Neck: Normal range of motion. Neck supple. No JVD present. No thyromegaly present. No local lymphadenopathy. Cardiovascular: Regular rate, regular rhythm with no murmurs heard.  Pulmonary/Chest: Effort normal and breath sounds clear in all lung fields.  Neurological:  The patient is alert, awake and oriented x3. The patient has about 4 out of 5 strength in the lower extremities bilaterally. No focal deficits appreciated in the upper extremities bilaterally. Babinski's are downgoing bilaterally. Reflexes are +2.  Sensory Exam: Sensations were intact to light touch in all extremities. Vibration and proprioception are also intact Musculoskeletal: Normal range of motion bilateral UE. Skin: Skin is warm and dry. No rash noted. Psychiatric: Patient has a stable mood and affect. Behavior is normal in office today. Judgment and thought content normal in office today.   Assessment & Plan  1. Gingivitis Needs dental care services to address ongoing issues.   - amoxicillin-clavulanate (AUGMENTIN) 875-125 MG per tablet; Take 1 tablet by mouth 2 (two) times daily.  Dispense: 20 tablet; Refill: 0  2. Hypertension goal BP (blood pressure) < 150/90 Well controled on current medication.  - triamterene-hydrochlorothiazide (DYAZIDE) 37.5-25 MG per capsule; Take 1 each (1 capsule total)  by mouth daily.  Dispense: 90 capsule; Refill: 2

## 2014-10-25 ENCOUNTER — Telehealth: Payer: Self-pay | Admitting: Family Medicine

## 2014-10-25 DIAGNOSIS — M1990 Unspecified osteoarthritis, unspecified site: Secondary | ICD-10-CM | POA: Diagnosis not present

## 2014-10-25 DIAGNOSIS — K0510 Chronic gingivitis, plaque induced: Secondary | ICD-10-CM | POA: Insufficient documentation

## 2014-10-25 MED ORDER — IBUPROFEN 800 MG PO TABS: 800.0000 mg | ORAL_TABLET | Freq: Three times a day (TID) | ORAL | Status: DC | PRN

## 2014-10-25 NOTE — Telephone Encounter (Signed)
Pt's face is still in pain. She was wondering if you could write her something for pain. She uses the [facility].

## 2014-10-25 NOTE — Telephone Encounter (Signed)
Sent in ibuprofen 800mg  for her tooth pain.

## 2014-10-26 DIAGNOSIS — M1990 Unspecified osteoarthritis, unspecified site: Secondary | ICD-10-CM | POA: Diagnosis not present

## 2014-10-27 DIAGNOSIS — M1990 Unspecified osteoarthritis, unspecified site: Secondary | ICD-10-CM | POA: Diagnosis not present

## 2014-10-28 DIAGNOSIS — M1990 Unspecified osteoarthritis, unspecified site: Secondary | ICD-10-CM | POA: Diagnosis not present

## 2014-10-31 DIAGNOSIS — M1990 Unspecified osteoarthritis, unspecified site: Secondary | ICD-10-CM | POA: Diagnosis not present

## 2014-11-01 DIAGNOSIS — M1990 Unspecified osteoarthritis, unspecified site: Secondary | ICD-10-CM | POA: Diagnosis not present

## 2014-11-02 DIAGNOSIS — M1990 Unspecified osteoarthritis, unspecified site: Secondary | ICD-10-CM | POA: Diagnosis not present

## 2014-11-03 ENCOUNTER — Other Ambulatory Visit: Payer: Self-pay

## 2014-11-03 DIAGNOSIS — M1990 Unspecified osteoarthritis, unspecified site: Secondary | ICD-10-CM | POA: Diagnosis not present

## 2014-11-03 MED ORDER — SOLIFENACIN SUCCINATE 5 MG PO TABS: 5.0000 mg | ORAL_TABLET | Freq: Every day | ORAL | Status: DC

## 2014-11-03 NOTE — Telephone Encounter (Signed)
Refill request was sent to Dr. Krichna Sowles for approval and submission.  

## 2014-11-04 DIAGNOSIS — M1990 Unspecified osteoarthritis, unspecified site: Secondary | ICD-10-CM | POA: Diagnosis not present

## 2014-11-07 DIAGNOSIS — M1990 Unspecified osteoarthritis, unspecified site: Secondary | ICD-10-CM | POA: Diagnosis not present

## 2014-11-08 DIAGNOSIS — M1990 Unspecified osteoarthritis, unspecified site: Secondary | ICD-10-CM | POA: Diagnosis not present

## 2014-11-09 DIAGNOSIS — M1990 Unspecified osteoarthritis, unspecified site: Secondary | ICD-10-CM | POA: Diagnosis not present

## 2014-11-09 DIAGNOSIS — R339 Retention of urine, unspecified: Secondary | ICD-10-CM | POA: Diagnosis not present

## 2014-11-09 DIAGNOSIS — M539 Dorsopathy, unspecified: Secondary | ICD-10-CM | POA: Diagnosis not present

## 2014-11-09 DIAGNOSIS — G35 Multiple sclerosis: Secondary | ICD-10-CM | POA: Diagnosis not present

## 2014-11-09 DIAGNOSIS — N319 Neuromuscular dysfunction of bladder, unspecified: Secondary | ICD-10-CM | POA: Diagnosis not present

## 2014-11-10 DIAGNOSIS — M1990 Unspecified osteoarthritis, unspecified site: Secondary | ICD-10-CM | POA: Diagnosis not present

## 2014-11-11 DIAGNOSIS — M1990 Unspecified osteoarthritis, unspecified site: Secondary | ICD-10-CM | POA: Diagnosis not present

## 2014-11-18 ENCOUNTER — Other Ambulatory Visit: Payer: Self-pay | Admitting: Family Medicine

## 2014-11-18 ENCOUNTER — Telehealth: Payer: Self-pay | Admitting: Family Medicine

## 2014-11-18 DIAGNOSIS — N3000 Acute cystitis without hematuria: Secondary | ICD-10-CM

## 2014-11-18 MED ORDER — NITROFURANTOIN MONOHYD MACRO 100 MG PO CAPS: 100.0000 mg | ORAL_CAPSULE | Freq: Two times a day (BID) | ORAL | Status: DC

## 2014-11-18 NOTE — Telephone Encounter (Signed)
Patient notified and states she will follow up if symptoms do not resolve.

## 2014-11-18 NOTE — Telephone Encounter (Signed)
Patient called requesting UTI medication and I have made a one time exception and sent in an antibiotic to her [facility] pharmacy. If symptoms come back or get worse despite this she much f/u in clinic with me.

## 2014-11-18 NOTE — Telephone Encounter (Signed)
Pt states she has all the symptoms of UTI. She states she did a at home test last night and it was positive and wants to know if someone can bring a urine sample in for her and also if she can get a anitbiotic called into [facility]. She states she cannot get out in the heat to come in because it makes her MS worse.

## 2014-12-05 DIAGNOSIS — G35 Multiple sclerosis: Secondary | ICD-10-CM | POA: Diagnosis not present

## 2014-12-12 DIAGNOSIS — N319 Neuromuscular dysfunction of bladder, unspecified: Secondary | ICD-10-CM | POA: Diagnosis not present

## 2014-12-15 ENCOUNTER — Other Ambulatory Visit: Payer: Self-pay | Admitting: Family Medicine

## 2014-12-15 DIAGNOSIS — J449 Chronic obstructive pulmonary disease, unspecified: Secondary | ICD-10-CM

## 2014-12-15 MED ORDER — IPRATROPIUM-ALBUTEROL 20-100 MCG/ACT IN AERS: 1.0000 | INHALATION_SPRAY | Freq: Four times a day (QID) | RESPIRATORY_TRACT | Status: DC | PRN

## 2014-12-15 NOTE — Telephone Encounter (Signed)
PT NEEDS REFILL ON HER INHALER. [facility]ARM IS [facility]. SHE SAID THAT SHE IS STILL WHEEZING AND COUGHING. PT HAD SOMEONE CLEAN HER BANKING ACCOUNT OUT. PLEASE CALL [phone]

## 2014-12-15 NOTE — Telephone Encounter (Signed)
Refill request was sent to Dr. Ashany Sundaram for approval and submission.  

## 2014-12-28 DIAGNOSIS — N319 Neuromuscular dysfunction of bladder, unspecified: Secondary | ICD-10-CM | POA: Diagnosis not present

## 2014-12-28 DIAGNOSIS — N1330 Unspecified hydronephrosis: Secondary | ICD-10-CM | POA: Diagnosis not present

## 2015-01-10 ENCOUNTER — Emergency Department
Admission: EM | Admit: 2015-01-10 | Discharge: 2015-01-10 | Disposition: A | Discharge status: Home / Self Care | Payer: Medicare Other | Attending: Emergency Medicine | Admitting: Emergency Medicine

## 2015-01-10 ENCOUNTER — Emergency Department: Payer: Medicare Other

## 2015-01-10 ENCOUNTER — Encounter: Payer: Self-pay | Admitting: Emergency Medicine

## 2015-01-10 DIAGNOSIS — L03116 Cellulitis of left lower limb: Secondary | ICD-10-CM | POA: Diagnosis not present

## 2015-01-10 DIAGNOSIS — I10 Essential (primary) hypertension: Secondary | ICD-10-CM | POA: Diagnosis not present

## 2015-01-10 DIAGNOSIS — W5501XA Bitten by cat, initial encounter: Secondary | ICD-10-CM | POA: Diagnosis not present

## 2015-01-10 DIAGNOSIS — Y998 Other external cause status: Secondary | ICD-10-CM | POA: Diagnosis not present

## 2015-01-10 DIAGNOSIS — S91352A Open bite, left foot, initial encounter: Secondary | ICD-10-CM | POA: Diagnosis present

## 2015-01-10 DIAGNOSIS — Y9389 Activity, other specified: Secondary | ICD-10-CM | POA: Insufficient documentation

## 2015-01-10 DIAGNOSIS — S90872A Other superficial bite of left foot, initial encounter: Secondary | ICD-10-CM | POA: Diagnosis not present

## 2015-01-10 DIAGNOSIS — M79672 Pain in left foot: Secondary | ICD-10-CM | POA: Diagnosis not present

## 2015-01-10 DIAGNOSIS — M19072 Primary osteoarthritis, left ankle and foot: Secondary | ICD-10-CM | POA: Diagnosis not present

## 2015-01-10 DIAGNOSIS — Z720 Tobacco use: Secondary | ICD-10-CM | POA: Diagnosis not present

## 2015-01-10 DIAGNOSIS — Y9289 Other specified places as the place of occurrence of the external cause: Secondary | ICD-10-CM | POA: Diagnosis not present

## 2015-01-10 MED ORDER — AMOXICILLIN-POT CLAVULANATE 875-125 MG PO TABS: 1.0000 | ORAL_TABLET | Freq: Two times a day (BID) | ORAL | Status: DC

## 2015-01-10 MED ORDER — OXYCODONE-ACETAMINOPHEN 5-325 MG PO TABS: 1.0000 | ORAL_TABLET | Freq: Four times a day (QID) | ORAL | Status: DC | PRN

## 2015-01-10 NOTE — ED Notes (Signed)
Pt BIB EMS. Pt reports left foot pain. States bitten by kitten.

## 2015-01-10 NOTE — ED Provider Notes (Signed)
[facility] Emergency Department Provider Note     Time seen: ----------------------------------------- 4:52 PM on 01/10/2015 -----------------------------------------    I have reviewed the triage vital signs and the nursing notes.   HISTORY  Chief Complaint Foot Pain    HPI [name] is a 64 y.o. female who presents to ER for left foot pain. Patient reports she was bitten by kitten recently, the cat is currently in quarantine at the animal shelter. She's had left foot pain for last 2 weeks, started more on the first toe with redness that has spread. She's had cellulitis in the past, family is concerned about same. She denies any fevers or other complaints.   Past Medical History  Diagnosis Date  . Anxiety   . COPD (chronic obstructive pulmonary disease)   . Depression   . Hypertension   . Neuromuscular disorder     Patient Active Problem List   Diagnosis Date Noted  . Gingivitis 10/25/2014  . Headache, migraine 10/24/2014  . DS (disseminated sclerosis) 10/24/2014  . 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 Current tobacco use 10/24/2014  . Obstructive apnea 11/18/2013  . Back pain, chronic 10/02/2013  . CAFL (chronic airflow limitation) 10/02/2013  . Clinical depression 10/02/2013  . Hypertension goal BP (blood pressure) < 150/90 10/02/2013  . Neuropathy 10/02/2013  . Absence of bladder continence 09/25/2012  . Acontractile bladder 02/12/2012  . Bladder compliance low 02/12/2012  . Bladder retention 02/12/2012  . Benign essential hypertension, delivered, with postpartum complication 47/12/6281  . Narrowing of intervertebral disc space 11/26/2011  . Kyphoscoliosis and scoliosis 11/26/2011    Past Surgical History  Procedure Laterality Date  . Back surgery N/A 2002  . Tonsillectomy and adenoidectomy    . Tubal ligation      Allergies Review of patient's allergies indicates no known allergies.  Social History Social History   Substance Use Topics  . Smoking status: Current Some Day Smoker    Types: Cigarettes  . Smokeless tobacco: None  . Alcohol Use: No    Review of Systems Constitutional: Negative for fever. Musculoskeletal: Positive for left foot pain Skin: Positive for redness in the left foot ____________________________________________   PHYSICAL EXAM:  VITAL SIGNS: ED Triage Vitals  Enc Vitals Group     BP 01/10/15 1624 152/81 mmHg     Pulse Rate 01/10/15 1624 82     Resp 01/10/15 1624 20     Temp 01/10/15 1624 97.9 F (36.6 C)     Temp Source 01/10/15 1624 Oral     SpO2 01/10/15 1624 100 %     Weight 01/10/15 1624 270 lb (122.471 kg)     Height 01/10/15 1624 5\' 3"  (1.6 m)     Head Cir --      Peak Flow --      Pain Score 01/10/15 1624 8     Pain Loc --      Pain Edu? --      Excl. in [place]? --     Constitutional: Alert and oriented. Well appearing and in no distress. Musculoskeletal: Left foot is mildly tender to touch, with mild erythema, no significant pain with range of motion of the left foot or ankle. Normal pulses in the left foot are noted. Neurologic: No gross focal neurologic deficits are appreciated. No gait instability. Skin:  There is some erythema to the left foot, no obvious wounds are appreciated. ____________________________________________  ED COURSE:  Pertinent labs & imaging results that were available during my care  of the patient were reviewed by me and considered in my medical decision making (see chart for details). We'll obtain left foot x-rays and reevaluate. ____________________________________________   RADIOLOGY Images were viewed by me  Left foot x-rays IMPRESSION: 1. No acute or subacute osseous abnormality. Specifically, no evidence of osteomyelitis. 2. Osteoarthritis involving the cuboid-5th metatarsal joint and the 1st MTP joint. ____________________________________________  FINAL ASSESSMENT AND PLAN  Left foot pain, cellulitis  Plan:  Patient with labs and imaging as dictated above. Patient be discharged on Augmentin to cover her for cat bite. Cat is currently under quarantine, she is advised to have follow-up in 2 days with her doctor for reassessment.   Earleen Newport, MD   Earleen Newport, MD 01/10/15 [phone]

## 2015-01-10 NOTE — Discharge Instructions (Signed)
Animal Bite Animal bite wounds can get infected. It is important to get proper medical treatment. Ask your doctor if you need a rabies shot. HOME CARE   Follow your doctor's instructions for taking care of your wound.  Only take medicine as told by your doctor.  Take your medicine (antibiotics) as told. Finish them even if you start to feel better.  Keep all doctor visits as told. You may need a tetanus shot if:   You cannot remember when you had your last tetanus shot.  You have never had a tetanus shot.  The injury broke your skin. If you need a tetanus shot and you choose not to have one, you may get tetanus. Sickness from tetanus can be serious. GET HELP RIGHT AWAY IF:   Your wound is warm, red, sore, or puffy (swollen).  You notice yellowish-white fluid (pus) or a bad smell coming from the wound.  You see a red line on the skin coming from the wound.  You have a fever, chills, or you feel sick.  You feel sick to your stomach (nauseous), or you throw up (vomit).  Your pain does not go away, or it gets worse.  You have trouble moving the injured part.  You have questions or concerns. MAKE SURE YOU:   Understand these instructions.  Will watch your condition.  Will get help right away if you are not doing well or get worse. Document Released: 04/15/2005 Document Revised: 07/08/2011 Document Reviewed: 12/05/2010 [facility] Patient Information 2015 [place], [place]. This information is not intended to replace advice given to you by your health care provider. Make sure you discuss any questions you have with your health care provider. Cellulitis Cellulitis is an infection of the skin and the tissue beneath it. The infected area is usually red and tender. Cellulitis occurs most often in the arms and lower legs.  CAUSES  Cellulitis is caused by bacteria that enter the skin through cracks or cuts in the skin. The most common types of bacteria that cause cellulitis are  staphylococci and streptococci. SIGNS AND SYMPTOMS   Redness and warmth.  Swelling.  Tenderness or pain.  Fever. DIAGNOSIS  Your health care provider can usually determine what is wrong based on a physical exam. Blood tests may also be done. TREATMENT  Treatment usually involves taking an antibiotic medicine. HOME CARE INSTRUCTIONS   Take your antibiotic medicine as directed by your health care provider. Finish the antibiotic even if you start to feel better.  Keep the infected arm or leg elevated to reduce swelling.  Apply a warm cloth to the affected area up to 4 times per day to relieve pain.  Take medicines only as directed by your health care provider.  Keep all follow-up visits as directed by your health care provider. SEEK MEDICAL CARE IF:   You notice red streaks coming from the infected area.  Your red area gets larger or turns dark in color.  Your bone or joint underneath the infected area becomes painful after the skin has healed.  Your infection returns in the same area or another area.  You notice a swollen bump in the infected area.  You develop new symptoms.  You have a fever. SEEK IMMEDIATE MEDICAL CARE IF:   You feel very sleepy.  You develop vomiting or diarrhea.  You have a general ill feeling (malaise) with muscle aches and pains. MAKE SURE YOU:   Understand these instructions.  Will watch your condition.  Will get help  right away if you are not doing well or get worse. Document Released: 01/23/2005 Document Revised: 08/30/2013 Document Reviewed: 07/01/2011 [facility] Patient Information 2015 [place], [place]. This information is not intended to replace advice given to you by your health care provider. Make sure you discuss any questions you have with your health care provider.

## 2015-01-10 NOTE — ED Notes (Signed)
Pt to ed with c/o left foot swelling and pain x 2 weeks.  Pt states it started with a sore first toe on left foot and then has noted swelling and redness.

## 2015-01-13 ENCOUNTER — Encounter: Payer: Self-pay | Admitting: Family Medicine

## 2015-01-13 ENCOUNTER — Ambulatory Visit (INDEPENDENT_AMBULATORY_CARE_PROVIDER_SITE_OTHER): Payer: Medicare Other | Admitting: Family Medicine

## 2015-01-13 VITALS — BP 146/82 | HR 98 | Temp 99.0°F | Resp 16 | Ht 65.0 in | Wt 270.0 lb

## 2015-01-13 DIAGNOSIS — N390 Urinary tract infection, site not specified: Secondary | ICD-10-CM | POA: Insufficient documentation

## 2015-01-13 DIAGNOSIS — I10 Essential (primary) hypertension: Secondary | ICD-10-CM | POA: Diagnosis not present

## 2015-01-13 DIAGNOSIS — T148XXA Other injury of unspecified body region, initial encounter: Secondary | ICD-10-CM | POA: Insufficient documentation

## 2015-01-13 DIAGNOSIS — B379 Candidiasis, unspecified: Secondary | ICD-10-CM

## 2015-01-13 DIAGNOSIS — J441 Chronic obstructive pulmonary disease with (acute) exacerbation: Secondary | ICD-10-CM | POA: Insufficient documentation

## 2015-01-13 DIAGNOSIS — T3695XA Adverse effect of unspecified systemic antibiotic, initial encounter: Secondary | ICD-10-CM

## 2015-01-13 DIAGNOSIS — G4733 Obstructive sleep apnea (adult) (pediatric): Secondary | ICD-10-CM | POA: Insufficient documentation

## 2015-01-13 DIAGNOSIS — L03116 Cellulitis of left lower limb: Secondary | ICD-10-CM

## 2015-01-13 DIAGNOSIS — R339 Retention of urine, unspecified: Secondary | ICD-10-CM | POA: Insufficient documentation

## 2015-01-13 DIAGNOSIS — I739 Peripheral vascular disease, unspecified: Secondary | ICD-10-CM | POA: Insufficient documentation

## 2015-01-13 DIAGNOSIS — N8184 Pelvic muscle wasting: Secondary | ICD-10-CM | POA: Insufficient documentation

## 2015-01-13 DIAGNOSIS — Z716 Tobacco abuse counseling: Secondary | ICD-10-CM | POA: Insufficient documentation

## 2015-01-13 MED ORDER — LIDOCAINE HCL (PF) 1 % IJ SOLN: 2.0000 mL | Freq: Once | INTRAMUSCULAR | Status: DC

## 2015-01-13 MED ORDER — FLUCONAZOLE 150 MG PO TABS: ORAL_TABLET | ORAL | Status: DC

## 2015-01-13 MED ORDER — SULFAMETHOXAZOLE-TRIMETHOPRIM 800-160 MG PO TABS: 1.0000 | ORAL_TABLET | Freq: Two times a day (BID) | ORAL | Status: DC

## 2015-01-13 MED ORDER — CEFTRIAXONE SODIUM 1 G IJ SOLR: 1.0000 g | Freq: Once | INTRAMUSCULAR | Status: DC

## 2015-01-13 MED ORDER — TRIAMTERENE-HCTZ 75-50 MG PO TABS: 1.0000 | ORAL_TABLET | Freq: Every day | ORAL | Status: DC

## 2015-01-13 NOTE — Progress Notes (Signed)
Name: [name]   MRN: [id number]    DOB: May 15, 1950   Date:01/13/2015       Progress Note  Subjective  Chief Complaint  Chief Complaint  Patient presents with  . Cellulitis    left foot. patient was seen at [facility] on 01/10/15 and was given Augmentin 875mg  and Roxicet    HPI  [name] is a 64 year old female who is here for routine follow up of her HTN and discuss recent acute infectious process. She had a cat at home which scratched her left foot and it started getting red, inflamed and painful starting about 5 days ago. She went to the ER on 9//13/16 and an X-ray of her foot showed no osteomyelitis and she was placed on Augmentin and sent home. She notes mild improvement since then but not much better.  Not associated with fevers, chills, myalgias, numbness, drainage in the area of the infection.  Maryse's blood pressure has been elevated recently. She is not exercising and is not adherent to low salt diet. Blood pressure is not well controlled at home. Cardiac symptoms none. Patient denies chest pain, irregular heart beat, lower extremity edema, near-syncope and palpitations. Cardiovascular risk factors: advanced age (older than 78 for men, 27 for women), hypertension, obesity (BMI >= 30 kg/m2), sedentary lifestyle and smoking/ tobacco exposure. Current medications include Maxzide 37.5-25 mg one a day. She reports no side effects from this medication other than yeast infection in vaginal area from recent antibiotic use.  Patient Active Problem List   Diagnosis Date Noted  . Chronic urinary tract infection 01/13/2015  . Acute exacerbation of chronic obstructive airways disease 01/13/2015  . Tobacco abuse counseling 01/13/2015  . Obstructive sleep apnea of adult 01/13/2015  . Pelvic muscle wasting 01/13/2015  . Angiopathy, peripheral 01/13/2015  . Incomplete bladder emptying 01/13/2015  . Wound of skin 01/13/2015  . Gingivitis 10/25/2014  . Headache, migraine 10/24/2014  . DS  (disseminated sclerosis) 10/24/2014  . 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 Current tobacco use 10/24/2014  . Obstructive apnea 11/18/2013  . Back pain, chronic 10/02/2013  . CAFL (chronic airflow limitation) 10/02/2013  . Clinical depression 10/02/2013  . Hypertension goal BP (blood pressure) < 150/90 10/02/2013  . Neuropathy 10/02/2013  . Absence of bladder continence 09/25/2012  . Acontractile bladder 02/12/2012  . Bladder compliance low 02/12/2012  . Bladder retention 02/12/2012  . Benign essential hypertension, delivered, with postpartum complication 95/12/3265  . Narrowing of intervertebral disc space 11/26/2011  . Kyphoscoliosis and scoliosis 11/26/2011    Social History  Substance Use Topics  . Smoking status: Current Some Day Smoker    Types: Cigarettes  . Smokeless tobacco: Not on file  . Alcohol Use: No     Current outpatient prescriptions:  .  amoxicillin-clavulanate (AUGMENTIN) 875-125 MG per tablet, Take 1 tablet by mouth every 12 (twelve) hours., Disp: 20 tablet, Rfl: 0 .  buPROPion (BUDEPRION XL) 300 MG 24 hr tablet, Take 1 tablet by mouth daily., Disp: , Rfl:  .  Calcium Carbonate-Vitamin D (CALCIUM 500 + D) 500-125 MG-UNIT TABS, Take 1 tablet by mouth daily., Disp: , Rfl:  .  clonazePAM (KLONOPIN) 1 MG tablet, Take 1 tablet by mouth 2 (two) times daily as needed., Disp: , Rfl:  .  cyclobenzaprine (FLEXERIL) 10 MG tablet, Take 1 tablet by mouth 2 (two) times daily as needed., Disp: , Rfl:  .  dalfampridine (AMPYRA) 10 MG TB12, Take 1 tablet by mouth 2 (two) times daily., Disp: ,  Rfl:  .  DULoxetine (CYMBALTA) 60 MG capsule, Take 1 capsule by mouth daily., Disp: , Rfl:  .  gabapentin (NEURONTIN) 600 MG tablet, Take 1 tablet by mouth 3 (three) times daily., Disp: , Rfl:  .  ibuprofen (ADVIL,MOTRIN) 800 MG tablet, Take 1 tablet (800 mg total) by mouth every 8 (eight) hours as needed for fever or moderate pain., Disp: 30 tablet, Rfl: 1 .  Interferon Beta-1a (AVONEX  PEN) 30 MCG/0.5ML AJKT, Inject 0.5 mLs into the skin once a week., Disp: , Rfl:  .  Ipratropium-Albuterol (COMBIVENT) 20-100 MCG/ACT AERS respimat, Inhale 1 puff into the lungs every 6 (six) hours as needed for wheezing or shortness of breath., Disp: 1 Inhaler, Rfl: 0 .  Multiple Vitamin (MULTI-VITAMINS) TABS, Take 1 tablet by mouth daily., Disp: , Rfl:  .  nitrofurantoin, macrocrystal-monohydrate, (MACROBID) 100 MG capsule, Take 1 capsule (100 mg total) by mouth 2 (two) times daily., Disp: 10 capsule, Rfl: 0 .  oxyCODONE-acetaminophen (ROXICET) 5-325 MG per tablet, Take 1 tablet by mouth every 6 (six) hours as needed., Disp: 20 tablet, Rfl: 0 .  potassium chloride (K-DUR) 10 MEQ tablet, Take 1 tablet by mouth daily., Disp: , Rfl:  .  QUEtiapine Fumarate (SEROQUEL XR) 150 MG 24 hr tablet, Take 1 tablet by mouth at bedtime., Disp: , Rfl:  .  solifenacin (VESICARE) 5 MG tablet, Take 1 tablet (5 mg total) by mouth daily., Disp: 30 tablet, Rfl: 3 .  topiramate (TOPAMAX) 50 MG tablet, Take 1 tablet by mouth 2 (two) times daily., Disp: , Rfl:  .  triamterene-hydrochlorothiazide (DYAZIDE) 37.5-25 MG per capsule, Take 1 each (1 capsule total) by mouth daily., Disp: 90 capsule, Rfl: 2  Past Surgical History  Procedure Laterality Date  . Back surgery N/A 2002  . Tonsillectomy and adenoidectomy    . Tubal ligation      Family History  Problem Relation Age of Onset  . COPD Mother   . Diabetes Mother   . Alcohol abuse Father   . Kidney disease Father     No Known Allergies   Review of Systems  CONSTITUTIONAL: No significant weight changes, fever, chills, weakness or fatigue.  HEENT:  - Eyes: No visual changes.  - Ears: No auditory changes. No pain.  - Nose: No sneezing, congestion, runny nose. - Throat: No sore throat. No changes in swallowing. SKIN: Yes skin changes. CARDIOVASCULAR: No chest pain, chest pressure or chest discomfort. No palpitations or edema.  RESPIRATORY: No shortness of  breath, cough or sputum.  NEUROLOGICAL: No headache, dizziness, syncope, paralysis, ataxia, numbness or tingling in the extremities. No memory changes. No change in bowel or bladder control.  MUSCULOSKELETAL: Yes joint pain. No muscle pain. HEMATOLOGIC: No anemia, bleeding or bruising.  LYMPHATICS: No enlarged lymph nodes.  PSYCHIATRIC: No change in mood. No change in sleep pattern.  ENDOCRINOLOGIC: No reports of sweating, cold or heat intolerance. No polyuria or polydipsia.     Objective  BP 146/82 mmHg  Pulse 98  Temp(Src) 99 F (37.2 C) (Oral)  Resp 16  Ht 5\' 5"  (1.651 m)  Wt 270 lb (122.471 kg)  BMI 44.93 kg/m2  SpO2 94% Body mass index is 44.93 kg/(m^2).  Physical Exam  Constitutional: Patient is obese and well-nourished. In no distress.  HEENT:  - Head: Normocephalic and atraumatic.  - Ears: Bilateral TMs gray, no erythema or effusion - Nose: Nasal mucosa moist - Mouth/Throat: Oropharynx is clear and moist. No tonsillar hypertrophy or erythema. No post  nasal drainage.  - Eyes: Conjunctivae clear, EOM movements normal. PERRLA. No scleral icterus.  Neck: Normal range of motion. Neck supple. No JVD present. No thyromegaly present.  Cardiovascular: Normal rate, regular rhythm and normal heart sounds.  No murmur heard.  Pulmonary/Chest: Effort normal and breath sounds normal. No respiratory distress. Musculoskeletal: Normal range of motion bilateral UE. Left foot is mildly tender to touch, with mild erythema, no warmth, no drainage, no significant pain with range of motion of the left foot or ankle. Pulses 2+ in left foot.  Psychiatric: Patient has a stable mood and affect. Behavior is normal in office today. Judgment and thought content normal in office today.   Assessment & Plan  1. Cellulitis of left foot Reviewed ER document and X-ray report. No lab work drawn at the time. I will switch her to Bactrim DS and gave her Rocephin 1 g IM mixed with Xylocaine 1% 2 mL today.   Home care instructed and future prevention discussed.  - cefTRIAXone (ROCEPHIN) injection 1 g; Inject 1 g into the muscle once. - sulfamethoxazole-trimethoprim (BACTRIM DS,SEPTRA DS) 800-160 MG per tablet; Take 1 tablet by mouth 2 (two) times daily.  Dispense: 20 tablet; Refill: 0 - lidocaine (PF) (XYLOCAINE) 1 % injection 2 mL; Inject 2 mLs into the skin once.  2. Antibiotic-induced yeast infection May use diflucan as needed.  - fluconazole (DIFLUCAN) 150 MG tablet; Take one tablet by mouth now and 1 tablet po again after finishing antibiotics  Dispense: 2 tablet; Refill: 0  3. Hypertension goal BP (blood pressure) < 140/90 Sub optimal control. Will increased Maxzide dose.  - triamterene-hydrochlorothiazide (MAXZIDE) 75-50 MG per tablet; Take 1 tablet by mouth daily.  Dispense: 90 tablet; Refill: 3

## 2015-01-13 NOTE — Patient Instructions (Signed)

## 2015-01-17 ENCOUNTER — Other Ambulatory Visit: Payer: Self-pay | Admitting: Family Medicine

## 2015-01-19 DIAGNOSIS — M539 Dorsopathy, unspecified: Secondary | ICD-10-CM | POA: Diagnosis not present

## 2015-01-19 DIAGNOSIS — Z9181 History of falling: Secondary | ICD-10-CM | POA: Diagnosis not present

## 2015-01-19 DIAGNOSIS — R339 Retention of urine, unspecified: Secondary | ICD-10-CM | POA: Diagnosis not present

## 2015-01-19 DIAGNOSIS — N319 Neuromuscular dysfunction of bladder, unspecified: Secondary | ICD-10-CM | POA: Diagnosis not present

## 2015-01-19 DIAGNOSIS — I10 Essential (primary) hypertension: Secondary | ICD-10-CM | POA: Diagnosis not present

## 2015-01-19 DIAGNOSIS — Z466 Encounter for fitting and adjustment of urinary device: Secondary | ICD-10-CM | POA: Diagnosis not present

## 2015-01-19 DIAGNOSIS — G35 Multiple sclerosis: Secondary | ICD-10-CM | POA: Diagnosis not present

## 2015-01-31 ENCOUNTER — Ambulatory Visit (INDEPENDENT_AMBULATORY_CARE_PROVIDER_SITE_OTHER): Payer: Medicare Other | Admitting: Family Medicine

## 2015-01-31 ENCOUNTER — Encounter: Payer: Self-pay | Admitting: Family Medicine

## 2015-01-31 VITALS — BP 132/86 | HR 87 | Temp 98.4°F | Resp 18 | Wt 255.6 lb

## 2015-01-31 DIAGNOSIS — I10 Essential (primary) hypertension: Secondary | ICD-10-CM | POA: Diagnosis not present

## 2015-01-31 DIAGNOSIS — J449 Chronic obstructive pulmonary disease, unspecified: Secondary | ICD-10-CM

## 2015-01-31 DIAGNOSIS — K5903 Drug induced constipation: Secondary | ICD-10-CM | POA: Diagnosis not present

## 2015-01-31 MED ORDER — POLYETHYLENE GLYCOL 3350 17 GM/SCOOP PO POWD: 17.0000 g | Freq: Every day | ORAL | Status: DC

## 2015-01-31 MED ORDER — FLUTICASONE FUROATE-VILANTEROL 100-25 MCG/INH IN AEPB: 1.0000 | INHALATION_SPRAY | Freq: Every day | RESPIRATORY_TRACT | Status: DC

## 2015-01-31 MED ORDER — TRIAMTERENE-HCTZ 37.5-25 MG PO CAPS: 1.0000 | ORAL_CAPSULE | Freq: Every day | ORAL | Status: DC

## 2015-01-31 NOTE — Progress Notes (Signed)
Name: [name]   MRN: [id number]    DOB: 05/23/1950   Date:01/31/2015       Progress Note  Subjective  Chief Complaint  Chief Complaint  Patient presents with  . COPD    patient is here for a 18-month follow-up    HPI  [name] is a 64 year old female here for routine follow up of COPD and HTN. Patient complains of chronic dyspnea with activity, cough, fatigue. Symptoms began several years ago. Currently she is using Combivent only. Sputum is white and yellow in small amounts. Not associated with fevers, change in mentation. Patient uses 2 pillows at night. Patient currently is not on home oxygen therapy. Respiratory history: occasional episodes of bronchitis.   Patient here for follow-up of Hypertention. She is not exercising and is not adherent to low salt diet. Blood pressure is well controlled at home. Current medication include Triamterene-HCTZ 37.5-25 mg one a day. Cardiac symptoms none. Patient denies chest pain, irregular heart beat, lower extremity edema, near-syncope and palpitations. Cardiovascular risk factors: advanced age (older than 38 for men, 61 for women), hypertension, obesity (BMI >= 30 kg/m2), sedentary lifestyle and smoking/ tobacco exposure. Use of agents associated with hypertension: NSAIDs.   Requesting refill of miralax, blood pressure medication and anything for COPD as she is moving to [place] to be with her son and daughter in law.   Past Medical History  Diagnosis Date  . Anxiety   . COPD (chronic obstructive pulmonary disease) ([place])   . Depression   . Hypertension   . Neuromuscular disorder [facility])     Patient Active Problem List   Diagnosis Date Noted  . Constipation due to pain medication 01/31/2015  . Obstructive sleep apnea of adult 01/13/2015  . Pelvic muscle wasting 01/13/2015  . Incomplete bladder emptying 01/13/2015  . Headache, migraine 10/24/2014  . 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 Current tobacco use 10/24/2014  . Back pain,  chronic 10/02/2013  . CAFL (chronic airflow limitation) ([place]) 10/02/2013  . Clinical depression 10/02/2013  . Hypertension goal BP (blood pressure) < 140/90 10/02/2013  . Neuropathy ([place]) 10/02/2013  . Absence of bladder continence 09/25/2012  . Acontractile bladder 02/12/2012  . Bladder compliance low 02/12/2012  . Bladder retention 02/12/2012  . Narrowing of intervertebral disc space 11/26/2011  . Kyphoscoliosis and scoliosis 11/26/2011    Social History  Substance Use Topics  . Smoking status: Current Some Day Smoker    Types: Cigarettes  . Smokeless tobacco: Not on file  . Alcohol Use: No     Current outpatient prescriptions:  .  buPROPion (BUDEPRION XL) 300 MG 24 hr tablet, Take 1 tablet by mouth daily., Disp: , Rfl:  .  Calcium Carbonate-Vitamin D (CALCIUM 500 + D) 500-125 MG-UNIT TABS, Take 1 tablet by mouth daily., Disp: , Rfl:  .  clonazePAM (KLONOPIN) 1 MG tablet, Take 1 tablet by mouth 2 (two) times daily as needed., Disp: , Rfl:  .  cyclobenzaprine (FLEXERIL) 10 MG tablet, Take 1 tablet by mouth 2 (two) times daily as needed., Disp: , Rfl:  .  dalfampridine (AMPYRA) 10 MG TB12, Take 1 tablet by mouth 2 (two) times daily., Disp: , Rfl:  .  DULoxetine (CYMBALTA) 60 MG capsule, Take 1 capsule by mouth daily., Disp: , Rfl:  .  Fluticasone Furoate-Vilanterol 100-25 MCG/INH AEPB, Inhale 1 puff into the lungs daily., Disp: 60 each, Rfl: 2 .  gabapentin (NEURONTIN) 600 MG tablet, Take 1 tablet by mouth 3 (three)  times daily., Disp: , Rfl:  .  ibuprofen (ADVIL,MOTRIN) 800 MG tablet, Take 1 tablet (800 mg total) by mouth every 8 (eight) hours as needed for fever or moderate pain., Disp: 30 tablet, Rfl: 1 .  Interferon Beta-1a (AVONEX PEN) 30 MCG/0.5ML AJKT, Inject 0.5 mLs into the skin once a week., Disp: , Rfl:  .  methylphenidate (RITALIN) 10 MG tablet, , Disp: , Rfl: 0 .  Multiple Vitamin (MULTI-VITAMINS) TABS, Take 1 tablet by mouth daily., Disp: , Rfl:  .   oxyCODONE-acetaminophen (ROXICET) 5-325 MG per tablet, Take 1 tablet by mouth every 6 (six) hours as needed., Disp: 20 tablet, Rfl: 0 .  polyethylene glycol powder (GLYCOLAX/MIRALAX) powder, Take 17 g by mouth daily., Disp: 850 g, Rfl: 2 .  potassium chloride (K-DUR) 10 MEQ tablet, Take 1 tablet by mouth daily., Disp: , Rfl:  .  QUEtiapine Fumarate (SEROQUEL XR) 150 MG 24 hr tablet, Take 1 tablet by mouth at bedtime., Disp: , Rfl:  .  solifenacin (VESICARE) 5 MG tablet, Take 1 tablet (5 mg total) by mouth daily., Disp: 30 tablet, Rfl: 3 .  topiramate (TOPAMAX) 50 MG tablet, Take 1 tablet by mouth 2 (two) times daily., Disp: , Rfl:  .  triamterene-hydrochlorothiazide (DYAZIDE) 37.5-25 MG capsule, Take 1 each (1 capsule total) by mouth daily., Disp: 30 capsule, Rfl: 2  Current facility-administered medications:  .  cefTRIAXone (ROCEPHIN) injection 1 g, 1 g, Intramuscular, Once, Bobetta Lime, MD .  lidocaine (PF) (XYLOCAINE) 1 % injection 2 mL, 2 mL, Intradermal, Once, Bobetta Lime, MD  Past Surgical History  Procedure Laterality Date  . Back surgery N/A 2002  . Tonsillectomy and adenoidectomy    . Tubal ligation      Family History  Problem Relation Age of Onset  . COPD Mother   . Diabetes Mother   . Alcohol abuse Father   . Kidney disease Father     No Known Allergies   Review of Systems  CONSTITUTIONAL: No significant weight changes, fever, chills. Chronic weakness and fatigue.  HEENT:  - Eyes: No visual changes.  - Ears: No auditory changes. No pain.  - Nose: No sneezing, congestion, runny nose. - Throat: No sore throat. No changes in swallowing. SKIN: No rash or itching.  CARDIOVASCULAR: No chest pain, chest pressure or chest discomfort. No palpitations or edema.  RESPIRATORY: Yes shortness of breath, cough or sputum.  GASTROINTESTINAL: No anorexia, nausea, vomiting. No changes in bowel habits. No abdominal pain or blood.  GENITOURINARY: No dysuria. No frequency. No  discharge. Chronic urinary catheter in place. NEUROLOGICAL: No headache, dizziness, syncope, paralysis, ataxia, numbness or tingling in the extremities. No memory changes. No change in bowel or bladder control.  MUSCULOSKELETAL: Chronic joint pain. No muscle pain. PSYCHIATRIC: No change in mood. No change in sleep pattern.      Objective  BP 132/86 mmHg  Pulse 87  Temp(Src) 98.4 F (36.9 C) (Oral)  Resp 18  Wt 255 lb 9.6 oz (115.939 kg)  SpO2 94% Body mass index is 42.53 kg/(m^2).  Physical Exam  Constitutional: Patient is obese and well-nourished. In no distress. Using seated walker to ambulate, sitting in seated walker with no dyspnea. Conversational without gasping for breaths.   Cardiovascular: Normal rate, regular rhythm and normal heart sounds.  No murmur heard.  Pulmonary/Chest: Effort normal and breath sounds decreased in all lung fields with scattered wheezing. No respiratory distress. Neurological: CN II-XII grossly intact with no focal deficits. Alert and oriented to person,  place, and time.  Skin: Skin is warm and dry. No rash noted. No erythema. Left foot cellulitis resolved. Psychiatric: Patient has a stable mood and affect. Behavior is normal in office today. Judgment and thought content normal in office today.    Assessment & Plan   1. Chronic obstructive pulmonary disease, unspecified COPD type ([place]) Sub optimal control. Stop Combivent and start Dulera.   - Fluticasone Furoate-Vilanterol 100-25 MCG/INH AEPB; Inhale 1 puff into the lungs daily.  Dispense: 60 each; Refill: 2  2. Hypertension goal BP (blood pressure) < 140/90 Well controled.  - triamterene-hydrochlorothiazide (DYAZIDE) 37.5-25 MG capsule; Take 1 each (1 capsule total) by mouth daily.  Dispense: 30 capsule; Refill: 2  3. Constipation due to pain medication Refilled medication. Likely some component of opioid induced constipation.   - polyethylene glycol powder (GLYCOLAX/MIRALAX) powder; Take  17 g by mouth daily.  Dispense: 850 g; Refill: 2

## 2015-02-17 DIAGNOSIS — G35 Multiple sclerosis: Secondary | ICD-10-CM | POA: Diagnosis not present

## 2015-02-17 DIAGNOSIS — M539 Dorsopathy, unspecified: Secondary | ICD-10-CM | POA: Diagnosis not present

## 2015-02-17 DIAGNOSIS — R339 Retention of urine, unspecified: Secondary | ICD-10-CM | POA: Diagnosis not present

## 2015-02-17 DIAGNOSIS — N319 Neuromuscular dysfunction of bladder, unspecified: Secondary | ICD-10-CM | POA: Diagnosis not present

## 2015-03-16 DIAGNOSIS — T1490 Injury, unspecified: Secondary | ICD-10-CM | POA: Diagnosis not present

## 2015-04-18 ENCOUNTER — Ambulatory Visit (INDEPENDENT_AMBULATORY_CARE_PROVIDER_SITE_OTHER): Payer: Medicare Other | Admitting: Family Medicine

## 2015-04-18 ENCOUNTER — Encounter: Payer: Self-pay | Admitting: Family Medicine

## 2015-04-18 VITALS — BP 147/79 | HR 86 | Temp 97.8°F | Resp 16 | Ht 65.0 in | Wt 247.4 lb

## 2015-04-18 DIAGNOSIS — F3341 Major depressive disorder, recurrent, in partial remission: Secondary | ICD-10-CM

## 2015-04-18 DIAGNOSIS — L89892 Pressure ulcer of other site, stage 2: Secondary | ICD-10-CM | POA: Diagnosis not present

## 2015-04-18 DIAGNOSIS — I10 Essential (primary) hypertension: Secondary | ICD-10-CM | POA: Diagnosis not present

## 2015-04-18 DIAGNOSIS — G35 Multiple sclerosis: Secondary | ICD-10-CM

## 2015-04-18 DIAGNOSIS — M5416 Radiculopathy, lumbar region: Secondary | ICD-10-CM

## 2015-04-18 DIAGNOSIS — L89222 Pressure ulcer of left hip, stage 2: Secondary | ICD-10-CM | POA: Insufficient documentation

## 2015-04-18 DIAGNOSIS — J441 Chronic obstructive pulmonary disease with (acute) exacerbation: Secondary | ICD-10-CM | POA: Diagnosis not present

## 2015-04-18 NOTE — Progress Notes (Signed)
Name: [name]   MRN: [id number]    DOB: [birth year]   Date:04/18/2015       Progress Note  Subjective  Chief Complaint  Chief Complaint  Patient presents with  . Advice Only    patient stated that she needs help to get her home care services since she had to move back from [place]. [place].    HPI  [name] is a 64 year old female here for routine follow up. She last saw me 01/31/15 and had moved to [place]. [place] with her son and his wife but the situation did not work out so she is back here trying to get housing. Needs forms filled out to re-instate [facility]. If you may recall she has Multiple Sclerosis, chronic indwelling urinary catheter, weakness in lower extremities (walks with seated walker), depression, chronic pain.   Due to her medical conditions she needs assistance in her home with bathing, dressing, laundry, dishes, cooking, meal prep, generally set up for the day.   Also has a skin wound she noticed weeks ago, not getting bigger, red, or tender.   Patient complains of chronic dyspnea with activity, cough, fatigue. Symptoms began several years ago. At our last visit I switched Combivent to [facility]. Improved symptoms of chronic dyspnea with activity, cough. Always has fatigue. Not associated with fevers, change in mentation. Patient uses 2 pillows at night. Patient currently is not on home oxygen therapy. Respiratory history: occasional episodes of bronchitis. She reports smoking tobacco in pipes and marijuana.   Patient here for follow-up of Hypertention. She is not exercising and is not adherent to low salt diet. Blood pressure is well controlled at home. Current medication include Triamterene-HCTZ 37.5-25 mg one a day. Cardiac symptoms none. Patient denies chest pain, irregular heart beat, lower extremity edema, near-syncope and palpitations. Cardiovascular risk factors: advanced age (older than 77 for men, 73 for women), hypertension, obesity (BMI >= 30 kg/m2), sedentary lifestyle and  smoking/ tobacco exposure. Use of agents associated with hypertension: NSAIDs.   Past Medical History  Diagnosis Date  . Anxiety   . COPD (chronic obstructive pulmonary disease) ([place])   . Depression   . Hypertension   . Neuromuscular disorder [facility])     Patient Active Problem List   Diagnosis Date Noted  . Constipation due to pain medication 01/31/2015  . Obstructive sleep apnea of adult 01/13/2015  . Pelvic muscle wasting 01/13/2015  . Incomplete bladder emptying 01/13/2015  . Headache, migraine 10/24/2014  . 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 Current tobacco use 10/24/2014  . Back pain, chronic 10/02/2013  . CAFL (chronic airflow limitation) ([place]) 10/02/2013  . Clinical depression 10/02/2013  . Hypertension goal BP (blood pressure) < 140/90 10/02/2013  . Neuropathy ([place]) 10/02/2013  . Absence of bladder continence 09/25/2012  . Acontractile bladder 02/12/2012  . Bladder compliance low 02/12/2012  . Bladder retention 02/12/2012  . Narrowing of intervertebral disc space 11/26/2011  . Kyphoscoliosis and scoliosis 11/26/2011    Social History  Substance Use Topics  . Smoking status: Current Some Day Smoker    Types: Cigarettes  . Smokeless tobacco: Not on file  . Alcohol Use: No     Current outpatient prescriptions:  .  buPROPion (BUDEPRION XL) 300 MG 24 hr tablet, Take 1 tablet by mouth daily., Disp: , Rfl:  .  Calcium Carbonate-Vitamin D (CALCIUM 500 + D) 500-125 MG-UNIT TABS, Take 1 tablet by mouth daily., Disp: , Rfl:  .  clonazePAM (KLONOPIN) 1 MG tablet, Take 1  tablet by mouth 2 (two) times daily as needed., Disp: , Rfl:  .  cyclobenzaprine (FLEXERIL) 10 MG tablet, Take 1 tablet by mouth 2 (two) times daily as needed., Disp: , Rfl:  .  dalfampridine (AMPYRA) 10 MG TB12, Take 1 tablet by mouth 2 (two) times daily., Disp: , Rfl:  .  DULoxetine (CYMBALTA) 60 MG capsule, Take 1 capsule by mouth daily., Disp: , Rfl:  .  Fluticasone Furoate-Vilanterol 100-25 MCG/INH  AEPB, Inhale 1 puff into the lungs daily., Disp: 60 each, Rfl: 2 .  gabapentin (NEURONTIN) 600 MG tablet, Take 1 tablet by mouth 3 (three) times daily., Disp: , Rfl:  .  ibuprofen (ADVIL,MOTRIN) 800 MG tablet, Take 1 tablet (800 mg total) by mouth every 8 (eight) hours as needed for fever or moderate pain., Disp: 30 tablet, Rfl: 1 .  Interferon Beta-1a (AVONEX PEN) 30 MCG/0.5ML AJKT, Inject 0.5 mLs into the skin once a week., Disp: , Rfl:  .  methylphenidate (RITALIN) 10 MG tablet, , Disp: , Rfl: 0 .  Multiple Vitamin (MULTI-VITAMINS) TABS, Take 1 tablet by mouth daily., Disp: , Rfl:  .  oxyCODONE-acetaminophen (ROXICET) 5-325 MG per tablet, Take 1 tablet by mouth every 6 (six) hours as needed., Disp: 20 tablet, Rfl: 0 .  polyethylene glycol powder (GLYCOLAX/MIRALAX) powder, Take 17 g by mouth daily., Disp: 850 g, Rfl: 2 .  potassium chloride (K-DUR) 10 MEQ tablet, Take 1 tablet by mouth daily., Disp: , Rfl:  .  QUEtiapine Fumarate (SEROQUEL XR) 150 MG 24 hr tablet, Take 1 tablet by mouth at bedtime., Disp: , Rfl:  .  solifenacin (VESICARE) 5 MG tablet, Take 1 tablet (5 mg total) by mouth daily., Disp: 30 tablet, Rfl: 3 .  topiramate (TOPAMAX) 50 MG tablet, Take 1 tablet by mouth 2 (two) times daily., Disp: , Rfl:  .  triamterene-hydrochlorothiazide (DYAZIDE) 37.5-25 MG capsule, Take 1 each (1 capsule total) by mouth daily., Disp: 30 capsule, Rfl: 2  Current facility-administered medications:  .  cefTRIAXone (ROCEPHIN) injection 1 g, 1 g, Intramuscular, Once, Bobetta Lime, MD .  lidocaine (PF) (XYLOCAINE) 1 % injection 2 mL, 2 mL, Intradermal, Once, Bobetta Lime, MD  Past Surgical History  Procedure Laterality Date  . Back surgery N/A 2002  . Tonsillectomy and adenoidectomy    . Tubal ligation      Family History  Problem Relation Age of Onset  . COPD Mother   . Diabetes Mother   . Alcohol abuse Father   . Kidney disease Father     No Known Allergies   Review of  Systems  CONSTITUTIONAL: No significant weight changes, fever, chills, weakness. Chronic fatigue.  HEENT:  - Eyes: No visual changes.  - Ears: No auditory changes. No pain.  - Nose: No sneezing, congestion, runny nose. - Throat: No sore throat. No changes in swallowing. SKIN: YES skin wound CARDIOVASCULAR: No chest pain, chest pressure or chest discomfort. No palpitations or edema.  RESPIRATORY: No worsening shortness of breath, cough or sputum.  GASTROINTESTINAL: No anorexia, nausea, vomiting. No changes in bowel habits. No abdominal pain or blood.  GENITOURINARY: No dysuria. No frequency. No discharge. CHRONIC urinary catheter in place. NEUROLOGICAL: No headache, dizziness, syncope. CHRONIC stable numbness or tingling in the lower extremities. No memory changes.  MUSCULOSKELETAL: CHRONIC joint pain. No muscle pain. LYMPHATICS: No enlarged lymph nodes.  PSYCHIATRIC: No change in mood. No change in sleep pattern.  ENDOCRINOLOGIC: No reports of sweating, cold or heat intolerance. No polyuria or polydipsia.  Objective  BP 147/79 mmHg  Pulse 86  Temp(Src) 97.8 F (36.6 C) (Oral)  Resp 16  Ht 5\' 5"  (1.651 m)  Wt 247 lb 6.4 oz (112.22 kg)  BMI 41.17 kg/m2  SpO2 95% Body mass index is 41.17 kg/(m^2).  Physical Exam  Constitutional: Patient is obese and cautiously walks with use of a seated walker. In no distress.  HEENT:  - Head: Normocephalic and atraumatic.  - Ears: Bilateral TMs gray, no erythema or effusion - Nose: Nasal mucosa moist - Mouth/Throat: Oropharynx is clear and moist. No tonsillar hypertrophy or erythema. No post nasal drainage.  - Eyes: Conjunctivae clear, EOM movements normal. PERRLA. No scleral icterus.  Neck: Normal range of motion. Neck supple. No JVD present. No thyromegaly present.  Cardiovascular: Normal rate, regular rhythm and normal heart sounds.  No murmur heard.  Pulmonary/Chest: Effort normal and breath sounds clear with baseline low tidal  volume. No respiratory distress. Musculoskeletal: Normal range of motion bilateral UE. LE strength 4/5 bilaterally.  Peripheral vascular: Bilateral LE no edema. Chronic ulcerations of LE.  Neurological: CN II-XII grossly intact with no focal deficits. Alert and oriented to person, place, and time. Poor Coordination, balance. Normal speech.  Skin: Skin is warm and dry.  Left posterior area in between gluteal fold and upper thigh with stage 2 decubitus ulcer.  Psychiatric: Patient has a stable mood and affect.   Assessment & Plan  1. Multiple sclerosis (HCC) Stable. Will benefit from continued home personal care services, form filled out to be faxed.   - Ambulatory referral to Home Health  2. Lumbar radiculopathy, chronic Stable.  - Ambulatory referral to Home Health  3. COPD with bronchial hyperresponsiveness ([place]) Continue Dulera, clinically improved.  - Ambulatory referral to [place]  4. Hypertension goal BP (blood pressure) < 140/90 Stable.   - Ambulatory referral to Home Health  5. Major depressive disorder, recurrent, in partial remission (HCC) Stable.  - Ambulatory referral to [name]  6. Decubitus ulcer of left thigh, stage 2 Will consult home health nurse for assessment and treatment of decubitus ulcer stage 2 left upper thigh as she is home bound.   - Ambulatory referral to [facility]

## 2015-04-26 ENCOUNTER — Other Ambulatory Visit: Payer: Self-pay

## 2015-04-26 DIAGNOSIS — I10 Essential (primary) hypertension: Secondary | ICD-10-CM

## 2015-04-26 MED ORDER — TRIAMTERENE-HCTZ 37.5-25 MG PO CAPS: 1.0000 | ORAL_CAPSULE | Freq: Every day | ORAL | Status: DC

## 2015-04-26 NOTE — Telephone Encounter (Signed)
Got a fax from [facility] and I spoke with Lattie Haw, Pharmacist, regarding the refill request of this patient's Triamterene-HCTZ 37.5-25mg .  Refill request was sent to Dr. Bobetta Lime for approval and submission.

## 2015-05-11 DIAGNOSIS — N319 Neuromuscular dysfunction of bladder, unspecified: Secondary | ICD-10-CM | POA: Diagnosis not present

## 2015-05-11 DIAGNOSIS — R339 Retention of urine, unspecified: Secondary | ICD-10-CM | POA: Diagnosis not present

## 2015-05-12 ENCOUNTER — Other Ambulatory Visit: Payer: Self-pay | Admitting: Family Medicine

## 2015-05-12 NOTE — Telephone Encounter (Signed)
Requesting refill on Neurontin 600mg  and potassium. Please send to [email or web address]

## 2015-05-15 MED ORDER — POTASSIUM CHLORIDE ER 10 MEQ PO TBCR: 10.0000 meq | EXTENDED_RELEASE_TABLET | Freq: Every day | ORAL | Status: DC

## 2015-05-15 MED ORDER — GABAPENTIN 600 MG PO TABS: 600.0000 mg | ORAL_TABLET | Freq: Three times a day (TID) | ORAL | Status: DC

## 2015-05-16 ENCOUNTER — Other Ambulatory Visit: Payer: Self-pay | Admitting: Family Medicine

## 2015-05-16 ENCOUNTER — Ambulatory Visit: Payer: Medicare Other | Admitting: Family Medicine

## 2015-05-16 DIAGNOSIS — J449 Chronic obstructive pulmonary disease, unspecified: Secondary | ICD-10-CM

## 2015-05-16 MED ORDER — TOPIRAMATE 50 MG PO TABS: 50.0000 mg | ORAL_TABLET | Freq: Two times a day (BID) | ORAL | Status: DC

## 2015-05-16 MED ORDER — GABAPENTIN 600 MG PO TABS: 600.0000 mg | ORAL_TABLET | Freq: Three times a day (TID) | ORAL | Status: DC

## 2015-05-16 NOTE — Telephone Encounter (Signed)
Pt was scheduled for today and has no transportation and needs refills on Gabepentine, Topamax, pt says she has others but do not remember the names and will call back if she can think of them. Pt does not have another appt scheduled but states she will call back when she has transportation.

## 2015-06-19 DIAGNOSIS — G35 Multiple sclerosis: Secondary | ICD-10-CM | POA: Diagnosis not present

## 2015-06-20 DIAGNOSIS — G35 Multiple sclerosis: Secondary | ICD-10-CM | POA: Diagnosis not present

## 2015-06-21 DIAGNOSIS — G35 Multiple sclerosis: Secondary | ICD-10-CM | POA: Diagnosis not present

## 2015-06-22 DIAGNOSIS — G35 Multiple sclerosis: Secondary | ICD-10-CM | POA: Diagnosis not present

## 2015-06-23 DIAGNOSIS — G35 Multiple sclerosis: Secondary | ICD-10-CM | POA: Diagnosis not present

## 2015-06-24 DIAGNOSIS — G35 Multiple sclerosis: Secondary | ICD-10-CM | POA: Diagnosis not present

## 2015-07-03 DIAGNOSIS — G35 Multiple sclerosis: Secondary | ICD-10-CM | POA: Diagnosis not present

## 2015-07-04 DIAGNOSIS — G35 Multiple sclerosis: Secondary | ICD-10-CM | POA: Diagnosis not present

## 2015-07-05 DIAGNOSIS — G35 Multiple sclerosis: Secondary | ICD-10-CM | POA: Diagnosis not present

## 2015-07-06 DIAGNOSIS — G35 Multiple sclerosis: Secondary | ICD-10-CM | POA: Diagnosis not present

## 2015-07-07 DIAGNOSIS — G35 Multiple sclerosis: Secondary | ICD-10-CM | POA: Diagnosis not present

## 2015-07-08 DIAGNOSIS — G35 Multiple sclerosis: Secondary | ICD-10-CM | POA: Diagnosis not present

## 2015-07-12 DIAGNOSIS — Z720 Tobacco use: Secondary | ICD-10-CM | POA: Diagnosis not present

## 2015-07-12 DIAGNOSIS — Z466 Encounter for fitting and adjustment of urinary device: Secondary | ICD-10-CM | POA: Diagnosis not present

## 2015-07-12 DIAGNOSIS — M519 Unspecified thoracic, thoracolumbar and lumbosacral intervertebral disc disorder: Secondary | ICD-10-CM | POA: Diagnosis not present

## 2015-07-12 DIAGNOSIS — N319 Neuromuscular dysfunction of bladder, unspecified: Secondary | ICD-10-CM | POA: Diagnosis not present

## 2015-07-12 DIAGNOSIS — G35 Multiple sclerosis: Secondary | ICD-10-CM | POA: Diagnosis not present

## 2015-07-12 DIAGNOSIS — R338 Other retention of urine: Secondary | ICD-10-CM | POA: Diagnosis not present

## 2015-07-12 DIAGNOSIS — Z993 Dependence on wheelchair: Secondary | ICD-10-CM | POA: Diagnosis not present

## 2015-08-16 ENCOUNTER — Other Ambulatory Visit: Payer: Self-pay

## 2015-08-16 DIAGNOSIS — J449 Chronic obstructive pulmonary disease, unspecified: Secondary | ICD-10-CM

## 2015-08-16 MED ORDER — FLUTICASONE FUROATE-VILANTEROL 100-25 MCG/INH IN AEPB: 1.0000 | INHALATION_SPRAY | Freq: Every day | RESPIRATORY_TRACT | Status: DC

## 2015-08-16 NOTE — Telephone Encounter (Signed)
Prior notes reviewed; difficulty with transportation noted; will send refill

## 2015-09-01 ENCOUNTER — Other Ambulatory Visit: Payer: Self-pay

## 2015-09-01 DIAGNOSIS — K5903 Drug induced constipation: Secondary | ICD-10-CM

## 2015-09-01 NOTE — Telephone Encounter (Signed)
Dr. Nadine Counts prescribed a full 6 month supply of these medicines in January 2017; patient should not be out until July Please resolve with pharmacy

## 2015-10-03 ENCOUNTER — Telehealth: Payer: Self-pay | Admitting: Family Medicine

## 2015-10-03 NOTE — Telephone Encounter (Signed)
[name] from [place] requesting return call. Had requested medical records about a month ago and as of today have not received them. Would just like to speak with a nurse instead to get a verbal.  (P) [phone]

## 2015-10-03 NOTE — Telephone Encounter (Signed)
Left voicemail health port has already done given phone #

## 2015-10-25 ENCOUNTER — Telehealth: Payer: Self-pay | Admitting: Family Medicine
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
## 2015-10-25 NOTE — Telephone Encounter (Signed)
I also tried the first number: left message to go to ER or urgent care and to follow-up with Dr Sanda Klein in a few days. I also tried the second number: not able to leave message. The third number: tried 3 times and it was busy.

## 2015-10-26 ENCOUNTER — Emergency Department: Payer: Medicare Other

## 2015-10-26 ENCOUNTER — Emergency Department
Admission: EM | Admit: 2015-10-26 | Discharge: 2015-10-26 | Disposition: A | Discharge status: Home / Self Care | Payer: Medicare Other | Attending: Emergency Medicine | Admitting: Emergency Medicine

## 2015-10-26 DIAGNOSIS — I10 Essential (primary) hypertension: Secondary | ICD-10-CM | POA: Insufficient documentation

## 2015-10-26 DIAGNOSIS — Z79899 Other long term (current) drug therapy: Secondary | ICD-10-CM | POA: Insufficient documentation

## 2015-10-26 DIAGNOSIS — M7989 Other specified soft tissue disorders: Secondary | ICD-10-CM

## 2015-10-26 DIAGNOSIS — N289 Disorder of kidney and ureter, unspecified: Secondary | ICD-10-CM

## 2015-10-26 DIAGNOSIS — Z7951 Long term (current) use of inhaled steroids: Secondary | ICD-10-CM | POA: Insufficient documentation

## 2015-10-26 DIAGNOSIS — R2243 Localized swelling, mass and lump, lower limb, bilateral: Secondary | ICD-10-CM | POA: Diagnosis present

## 2015-10-26 DIAGNOSIS — F329 Major depressive disorder, single episode, unspecified: Secondary | ICD-10-CM | POA: Diagnosis not present

## 2015-10-26 DIAGNOSIS — R0602 Shortness of breath: Secondary | ICD-10-CM | POA: Insufficient documentation

## 2015-10-26 DIAGNOSIS — L03119 Cellulitis of unspecified part of limb: Secondary | ICD-10-CM | POA: Diagnosis not present

## 2015-10-26 DIAGNOSIS — F17210 Nicotine dependence, cigarettes, uncomplicated: Secondary | ICD-10-CM | POA: Diagnosis not present

## 2015-10-26 DIAGNOSIS — J449 Chronic obstructive pulmonary disease, unspecified: Secondary | ICD-10-CM | POA: Insufficient documentation

## 2015-10-26 HISTORY — DX: Multiple sclerosis: G35

## 2015-10-26 LAB — URINALYSIS COMPLETE WITH MICROSCOPIC (ARMC ONLY)
Bilirubin Urine: NEGATIVE
Glucose, UA: NEGATIVE mg/dL
Hgb urine dipstick: NEGATIVE
Ketones, ur: NEGATIVE mg/dL
Nitrite: POSITIVE — AB
Protein, ur: NEGATIVE mg/dL
Specific Gravity, Urine: 1.013 (ref 1.005–1.030)
pH: 6 (ref 5.0–8.0)

## 2015-10-26 LAB — HEPATIC FUNCTION PANEL
ALT: 18 U/L (ref 14–54)
AST: 22 U/L (ref 15–41)
Albumin: 3.8 g/dL (ref 3.5–5.0)
Alkaline Phosphatase: 60 U/L (ref 38–126)
Bilirubin, Direct: <0.1 mg/dL — ABNORMAL LOW (ref 0.1–0.5)
Total Bilirubin: 0.4 mg/dL (ref 0.3–1.2)
Total Protein: 6.9 g/dL (ref 6.5–8.1)

## 2015-10-26 LAB — CBC
HCT: 35.8 % (ref 35.0–47.0)
Hemoglobin: 12.3 g/dL (ref 12.0–16.0)
MCH: 28.9 pg (ref 26.0–34.0)
MCHC: 34.2 g/dL (ref 32.0–36.0)
MCV: 84.5 fL (ref 80.0–100.0)
Platelets: 222 10*3/uL (ref 150–440)
RBC: 4.24 MIL/uL (ref 3.80–5.20)
RDW: 14.4 % (ref 11.5–14.5)
WBC: 6.8 10*3/uL (ref 3.6–11.0)

## 2015-10-26 LAB — BASIC METABOLIC PANEL
Anion gap: 9 (ref 5–15)
BUN: 22 mg/dL — ABNORMAL HIGH (ref 6–20)
CO2: 26 mmol/L (ref 22–32)
Calcium: 8.9 mg/dL (ref 8.9–10.3)
Chloride: 97 mmol/L — ABNORMAL LOW (ref 101–111)
Creatinine, Ser: 1.41 mg/dL — ABNORMAL HIGH (ref 0.44–1.00)
GFR calc Af Amer: 45 mL/min — ABNORMAL LOW (ref >60)
GFR calc non Af Amer: 38 mL/min — ABNORMAL LOW (ref >60)
Glucose, Bld: 75 mg/dL (ref 65–99)
Potassium: 4 mmol/L (ref 3.5–5.1)
Sodium: 132 mmol/L — ABNORMAL LOW (ref 135–145)

## 2015-10-26 LAB — BRAIN NATRIURETIC PEPTIDE: B Natriuretic Peptide: 100 pg/mL (ref 0.0–100.0)

## 2015-10-26 LAB — TROPONIN I: Troponin I: <0.03 ng/mL (ref <0.03)

## 2015-10-26 MED ORDER — FUROSEMIDE 10 MG/ML IJ SOLN
40.0000 mg | Freq: Once | INTRAMUSCULAR | Status: AC
  Administered 2015-10-26: 40 mg via INTRAVENOUS
  Filled 2015-10-26: qty 4

## 2015-10-26 MED ORDER — CLOTRIMAZOLE 1 % EX CREA: 1.0000 "application " | TOPICAL_CREAM | Freq: Two times a day (BID) | CUTANEOUS | Status: DC

## 2015-10-26 MED ORDER — DOXYCYCLINE HYCLATE 100 MG PO CAPS: 100.0000 mg | ORAL_CAPSULE | Freq: Two times a day (BID) | ORAL | Status: DC

## 2015-10-26 MED ORDER — CLINDAMYCIN PHOSPHATE 600 MG/50ML IV SOLN
600.0000 mg | Freq: Once | INTRAVENOUS | Status: AC
  Administered 2015-10-26: 600 mg via INTRAVENOUS
  Filled 2015-10-26: qty 50

## 2015-10-26 MED ORDER — FUROSEMIDE 20 MG PO TABS: 20.0000 mg | ORAL_TABLET | Freq: Every day | ORAL | Status: DC

## 2015-10-26 NOTE — ED Notes (Signed)
Pt arrives with foley in place; states that urinary incontinence is due to MS.

## 2015-10-26 NOTE — Discharge Instructions (Signed)
You should take lasix 20 mg daily x 3 days.   Double your potassium for 3 days (20 meq daily x 3 days) as the lasix may drop your potassium.  Expect more urine output from foley catheter.   Take doxycyline twice daily for 7 days.   See your doctor   Keep leg elevated to help with swelling.  Apply clotrimazole cream twice daily to the area below the breast with the yeast infection.   Your kidney function is slightly abnormal. Repeat basic metabolic panel with your doctor in a week   Return to ER if you have fevers, worse shortness of breath, chest pain, leg swelling, worse leg cellulitis

## 2015-10-26 NOTE — ED Provider Notes (Signed)
CSN: [id number]     Arrival date & time 10/26/15  1239 History   First MD Initiated Contact with Patient 10/26/15 1716     Chief Complaint  Patient presents with  . Leg Swelling     (Consider location/radiation/quality/duration/timing/severity/associated sxs/prior Treatment) The history is provided by the patient.  [name] is a 65 y.o. female hx of COPD, depression, multiple sclerosis and wheel chair bound with chronic foley for neurogenic bladder, here with leg swelling. Patient has bilateral leg swelling for the last week or so. Also noticed increased redness of the bilateral ankles. She has some skin ulcers that are more painful but denies any purulent drainage from them. Denies fevers. Has associated shortness of breath as well but no cough or chest pain or fevers. She had her foley changed out yesterday and was told to come to be evaluated for leg swelling. Not currently on lasix, no hx of CHF or CAD. No hx of DVT    Past Medical History  Diagnosis Date  . Anxiety   . COPD (chronic obstructive pulmonary disease) ([place])   . Depression   . Hypertension   . Neuromuscular disorder ([place])   . Multiple sclerosis [facility])    Past Surgical History  Procedure Laterality Date  . Back surgery N/A 2002  . Tonsillectomy and adenoidectomy    . Tubal ligation     Family History  Problem Relation Age of Onset  . COPD Mother   . Diabetes Mother   . Alcohol abuse Father   . Kidney disease Father    Social History  Substance Use Topics  . Smoking status: Current Some Day Smoker    Types: Cigarettes  . Smokeless tobacco: None  . Alcohol Use: No   OB History    Gravida Para Term Preterm AB TAB SAB Ectopic Multiple Living   3         3     Review of Systems  Respiratory: Positive for shortness of breath.   Cardiovascular: Positive for leg swelling.  All other systems reviewed and are negative.     Allergies  Review of patient's allergies indicates no known allergies.  Home  Medications   Prior to Admission medications   Medication Sig Start Date End Date Taking? Authorizing Provider  buPROPion (BUDEPRION XL) 300 MG 24 hr tablet Take 1 tablet by mouth daily. 07/12/13   Historical Provider, MD  Calcium Carbonate-Vitamin D (CALCIUM 500 + D) 500-125 MG-UNIT TABS Take 1 tablet by mouth daily.    Historical Provider, MD  clonazePAM (KLONOPIN) 1 MG tablet Take 1 tablet by mouth 2 (two) times daily as needed. 07/12/13   Historical Provider, MD  cyclobenzaprine (FLEXERIL) 10 MG tablet Take 1 tablet by mouth 2 (two) times daily as needed. 07/12/13   Historical Provider, MD  dalfampridine (AMPYRA) 10 MG TB12 Take 1 tablet by mouth 2 (two) times daily. 10/21/13   Historical Provider, MD  DULoxetine (CYMBALTA) 60 MG capsule Take 1 capsule by mouth daily. 07/26/13   Historical Provider, MD  fluticasone furoate-vilanterol (BREO ELLIPTA) 100-25 MCG/INH AEPB Inhale 1 puff into the lungs daily. 08/16/15   Arnetha Courser, MD  gabapentin (NEURONTIN) 600 MG tablet Take 1 tablet (600 mg total) by mouth 3 (three) times daily. 05/16/15   Bobetta Lime, MD  ibuprofen (ADVIL,MOTRIN) 800 MG tablet Take 1 tablet (800 mg total) by mouth every 8 (eight) hours as needed for fever or moderate pain. 10/25/14   Bobetta Lime, MD  Interferon  Beta-1a (AVONEX PEN) 30 MCG/0.5ML AJKT Inject 0.5 mLs into the skin once a week. 07/05/13   Historical Provider, MD  methylphenidate (RITALIN) 10 MG tablet  01/23/15   Historical Provider, MD  Multiple Vitamin (MULTI-VITAMINS) TABS Take 1 tablet by mouth daily.    Historical Provider, MD  polyethylene glycol powder (GLYCOLAX/MIRALAX) powder Take 17 g by mouth daily. 01/31/15   Bobetta Lime, MD  potassium chloride (K-DUR) 10 MEQ tablet Take 1 tablet (10 mEq total) by mouth daily. 05/15/15   Bobetta Lime, MD  QUEtiapine Fumarate (SEROQUEL XR) 150 MG 24 hr tablet Take 1 tablet by mouth at bedtime. 07/24/13   Historical Provider, MD  solifenacin (VESICARE) 5 MG tablet  Take 1 tablet (5 mg total) by mouth daily. 11/03/14   Steele Sizer, MD  topiramate (TOPAMAX) 50 MG tablet Take 1 tablet (50 mg total) by mouth 2 (two) times daily. 05/16/15   Bobetta Lime, MD  triamterene-hydrochlorothiazide (DYAZIDE) 37.5-25 MG capsule Take 1 each (1 capsule total) by mouth daily. 04/26/15   Bobetta Lime, MD   BP 153/97 mmHg  Pulse 58  Temp(Src) 97.9 F (36.6 C) (Oral)  Resp 12  Ht 5\' 3"  (1.6 m)  Wt 270 lb (122.471 kg)  BMI 47.84 kg/m2  SpO2 100% Physical Exam  Constitutional: She is oriented to person, place, and time.  Chronically ill   HENT:  Head: Normocephalic.  Mouth/Throat: Oropharynx is clear and moist.  Eyes: Conjunctivae are normal. Pupils are equal, round, and reactive to light.  Neck: Normal range of motion. Neck supple.  Cardiovascular: Normal rate, regular rhythm and normal heart sounds.   Pulmonary/Chest:  Slightly tachypneic, + crackles bilateral bases   Abdominal: Soft. Bowel sounds are normal. She exhibits no distension. There is no tenderness.  Foley in place   Musculoskeletal:  Bilateral leg edema, no calf tenderness. Some superficial ulcers on right foot but no purulent discharge or signs of osteo. Some redness bilateral foot, worse on right side.   Neurological: She is alert and oriented to person, place, and time.  Skin: Skin is warm and dry.  Psychiatric: She has a normal mood and affect. Her behavior is normal. Judgment and thought content normal.  Nursing note and vitals reviewed.   ED Course  Procedures (including critical care time) Labs Review Labs Reviewed  BASIC METABOLIC PANEL - Abnormal; Notable for the following:    Sodium 132 (*)    Chloride 97 (*)    BUN 22 (*)    Creatinine, Ser 1.41 (*)    GFR calc non Af Amer 38 (*)    GFR calc Af Amer 45 (*)    All other components within normal limits  URINALYSIS COMPLETEWITH MICROSCOPIC ([facility] ONLY) - Abnormal; Notable for the following:    Color, Urine YELLOW (*)     APPearance HAZY (*)    Nitrite POSITIVE (*)    Leukocytes, UA 1+ (*)    Bacteria, UA FEW (*)    Squamous Epithelial / LPF 0-5 (*)    All other components within normal limits  HEPATIC FUNCTION PANEL - Abnormal; Notable for the following:    Bilirubin, Direct <0.1 (*)    All other components within normal limits  URINE CULTURE  CBC  BRAIN NATRIURETIC PEPTIDE  TROPONIN I    Imaging Review Dg Chest Port 1 View  10/26/2015  CLINICAL DATA:  She is having bilateral leg swelling and SOB for 1 week. Hx of COPD, hypertension, multiple sclerosis. EXAM: PORTABLE CHEST 1 VIEW  COMPARISON:  None. FINDINGS: The heart size and mediastinal contours are within normal limits. Both lungs are clear. The visualized skeletal structures are unremarkable. IMPRESSION: No active disease. Electronically Signed   By: Kathreen Devoid   On: 10/26/2015 18:28   I have personally reviewed and evaluated these images and lab results as part of my medical decision-making.   EKG Interpretation None       ED ECG REPORT I, Kaylee Trivett, the attending physician, personally viewed and interpreted this ECG.   Date: 10/26/2015  EKG Time: 18:13 pm  Rate: 53  Rhythm: normal EKG, normal sinus rhythm  Axis: normal  Intervals:nonspecific intraventricular conduction delay  ST&T Change: nonspecific    MDM   Final diagnoses:  None    ROYLENE MAHI is a 65 y.o. female hx of MS with indwelling foley here with shortness of breath, leg swelling. She is chronically ill and wheel chair bound from [place]. Not tachy or hypoxic, I doubt DVT/PE. I think likely new onset CHF vs dependent edema vs mild cellulitis. No subcutaneous air or signs of osteo. Will get labs, BNP, CXR. Appears volume overloaded so will give some lasix.   8:28 PM BNP 100. CXR clear. Given lasix 40 mg IV. Cr 1.4, baseline around 1. UA + leuk and nitrate but likely colonized. I added on urine culture. She was given clindamycin. WBC nl. Doesn't appear septic. Will dc  home with doxycycline which will cover the mild cellulitis and possible UTI. Will also give lasix 20 mg daily x 3 days. She is already on Kdur 10 mg daily and K 4.0 but was hemolyzed and will increase K dur to 20 mg daily x 3 days. Recommend repeat BMP in a week.      Wandra Arthurs, MD 10/26/15 2030

## 2015-10-26 NOTE — Telephone Encounter (Signed)
Thank you so much

## 2015-10-26 NOTE — Telephone Encounter (Signed)
Patient called this morning wanting to schedule appointment. I informed patient that we tried contacting her multiple times. We updated her phone numbers. I also informed patient that you wanted her to go the the ER or Urgent Care and patient verbalized understanding. I also informed her to contact us after she leaves the hospital to schedule follow up visit.

## 2015-10-26 NOTE — ED Notes (Addendum)
Pt arrives to ER via POV c/o bilateral lower leg swelling X 1 week. Painful to patient, warm to touch, reddened. Swelling present at time of arrival 1+ pitting edema. Denies SOB pr CP. Pt alert and oriented X4, active, cooperative, pt in NAD. RR even and unlabored, color WNL.

## 2015-10-29 LAB — URINE CULTURE: Culture: >=100000 — AB

## 2015-11-08 ENCOUNTER — Encounter: Payer: Self-pay | Admitting: Family Medicine

## 2015-11-08 ENCOUNTER — Ambulatory Visit (INDEPENDENT_AMBULATORY_CARE_PROVIDER_SITE_OTHER): Payer: Medicare Other | Admitting: Family Medicine

## 2015-11-08 VITALS — BP 124/86 | HR 80 | Temp 98.1°F | Resp 16 | Wt 272.0 lb

## 2015-11-08 DIAGNOSIS — L98499 Non-pressure chronic ulcer of skin of other sites with unspecified severity: Secondary | ICD-10-CM

## 2015-11-08 DIAGNOSIS — E871 Hypo-osmolality and hyponatremia: Secondary | ICD-10-CM

## 2015-11-08 DIAGNOSIS — L97909 Non-pressure chronic ulcer of unspecified part of unspecified lower leg with unspecified severity: Secondary | ICD-10-CM

## 2015-11-08 DIAGNOSIS — Z720 Tobacco use: Secondary | ICD-10-CM

## 2015-11-08 DIAGNOSIS — E786 Lipoprotein deficiency: Secondary | ICD-10-CM | POA: Diagnosis not present

## 2015-11-08 DIAGNOSIS — N183 Chronic kidney disease, stage 3 unspecified: Secondary | ICD-10-CM

## 2015-11-08 DIAGNOSIS — I739 Peripheral vascular disease, unspecified: Secondary | ICD-10-CM

## 2015-11-08 DIAGNOSIS — L98491 Non-pressure chronic ulcer of skin of other sites limited to breakdown of skin: Secondary | ICD-10-CM

## 2015-11-08 HISTORY — DX: Non-pressure chronic ulcer of skin of other sites with unspecified severity: L98.499

## 2015-11-08 HISTORY — DX: Non-pressure chronic ulcer of unspecified part of unspecified lower leg with unspecified severity: L97.909

## 2015-11-08 HISTORY — DX: Non-pressure chronic ulcer of unspecified part of unspecified lower leg with unspecified severity: I73.9

## 2015-11-08 NOTE — Assessment & Plan Note (Signed)
Encouraged her to quit smoking.  ?

## 2015-11-08 NOTE — Patient Instructions (Addendum)
I do encourage you to quit smoking Call [phone] to sign up for smoking cessation classes You can call 1-800-QUIT-NOW to talk with a smoking cessation coach  Let's get labs today  I've put in referrals to the vascular specialist about your circulation, and also to the wound care center  If you need something for aches or pains, try to use Tylenol (acetaminophen) instead of non-steroidals (which include Aleve, ibuprofen, Advil, Motrin, and naproxen); non-steroidals can cause long-term kidney damage   Steps to Quit Smoking  Smoking tobacco can be harmful to your health and can affect almost every organ in your body. Smoking puts you, and those around you, at risk for developing many serious chronic diseases. Quitting smoking is difficult, but it is one of the best things that you can do for your health. It is never too late to quit. WHAT ARE THE BENEFITS OF QUITTING SMOKING? When you quit smoking, you lower your risk of developing serious diseases and conditions, such as:  Lung cancer or lung disease, such as COPD.  Heart disease.  Stroke.  Heart attack.  Infertility.  Osteoporosis and bone fractures. Additionally, symptoms such as coughing, wheezing, and shortness of breath may get better when you quit. You may also find that you get sick less often because your body is stronger at fighting off colds and infections. If you are pregnant, quitting smoking can help to reduce your chances of having a baby of low birth weight. HOW DO I GET READY TO QUIT? When you decide to quit smoking, create a plan to make sure that you are successful. Before you quit:  Pick a date to quit. Set a date within the next two weeks to give you time to prepare.  Write down the reasons why you are quitting. Keep this list in places where you will see it often, such as on your bathroom mirror or in your car or wallet.  Identify the people, places, things, and activities that make you want to smoke  (triggers) and avoid them. Make sure to take these actions:  Throw away all cigarettes at home, at work, and in your car.  Throw away smoking accessories, such as Scientist, research (medical).  Clean your car and make sure to empty the ashtray.  Clean your home, including curtains and carpets.  Tell your family, friends, and coworkers that you are quitting. Support from your loved ones can make quitting easier.  Talk with your health care provider about your options for quitting smoking.  Find out what treatment options are covered by your health insurance. WHAT STRATEGIES CAN I USE TO QUIT SMOKING?  Talk with your healthcare provider about different strategies to quit smoking. Some strategies include:  Quitting smoking altogether instead of gradually lessening how much you smoke over a period of time. Research shows that quitting "cold [place]" is more successful than gradually quitting.  Attending in-person counseling to help you build problem-solving skills. You are more likely to have success in quitting if you attend several counseling sessions. Even short sessions of 10 minutes can be effective.  Finding resources and support systems that can help you to quit smoking and remain smoke-free after you quit. These resources are most helpful when you use them often. They can include:  Online chats with a Social worker.  Telephone quitlines.  Printed Furniture conservator/restorer.  Support groups or group counseling.  Text messaging programs.  Mobile phone applications.  Taking medicines to help you quit smoking. (If you are pregnant or breastfeeding,  talk with your health care provider first.) Some medicines contain nicotine and some do not. Both types of medicines help with cravings, but the medicines that include nicotine help to relieve withdrawal symptoms. Your health care provider may recommend:  Nicotine patches, gum, or lozenges.  Nicotine inhalers or sprays.  Non-nicotine medicine that  is taken by mouth. Talk with your health care provider about combining strategies, such as taking medicines while you are also receiving in-person counseling. Using these two strategies together makes you more likely to succeed in quitting than if you used either strategy on its own. If you are pregnant or breastfeeding, talk with your health care provider about finding counseling or other support strategies to quit smoking. Do not take medicine to help you quit smoking unless told to do so by your health care provider. WHAT THINGS CAN I DO TO MAKE IT EASIER TO QUIT? Quitting smoking might feel overwhelming at first, but there is a lot that you can do to make it easier. Take these important actions:  Reach out to your family and friends and ask that they support and encourage you during this time. Call telephone quitlines, reach out to support groups, or work with a counselor for support.  Ask people who smoke to avoid smoking around you.  Avoid places that trigger you to smoke, such as bars, parties, or smoke-break areas at work.  Spend time around people who do not smoke.  Lessen stress in your life, because stress can be a smoking trigger for some people. To lessen stress, try:  Exercising regularly.  Deep-breathing exercises.  Yoga.  Meditating.  Performing a body scan. This involves closing your eyes, scanning your body from head to toe, and noticing which parts of your body are particularly tense. Purposefully relax the muscles in those areas.  Download or purchase mobile phone or tablet apps (applications) that can help you stick to your quit plan by providing reminders, tips, and encouragement. There are many free apps, such as QuitGuide from the [employer] Office manager for Disease Control and Prevention). You can find other support for quitting smoking (smoking cessation) through smokefree.gov and other websites. HOW WILL I FEEL WHEN I QUIT SMOKING? Within the first 24 hours of quitting  smoking, you may start to feel some withdrawal symptoms. These symptoms are usually most noticeable 2-3 days after quitting, but they usually do not last beyond 2-3 weeks. Changes or symptoms that you might experience include:  Mood swings.  Restlessness, anxiety, or irritation.  Difficulty concentrating.  Dizziness.  Strong cravings for sugary foods in addition to nicotine.  Mild weight gain.  Constipation.  Nausea.  Coughing or a sore throat.  Changes in how your medicines work in your body.  A depressed mood.  Difficulty sleeping (insomnia). After the first 2-3 weeks of quitting, you may start to notice more positive results, such as:  Improved sense of smell and taste.  Decreased coughing and sore throat.  Slower heart rate.  Lower blood pressure.  Clearer skin.  The ability to breathe more easily.  Fewer sick days. Quitting smoking is very challenging for most people. Do not get discouraged if you are not successful the first time. Some people need to make many attempts to quit before they achieve long-term success. Do your best to stick to your quit plan, and talk with your health care provider if you have any questions or concerns.   This information is not intended to replace advice given to you by your health  care provider. Make sure you discuss any questions you have with your health care provider.   Document Released: 04/09/2001 Document Revised: 08/30/2014 Document Reviewed: 08/30/2014 Elsevier Interactive Patient Education [employer].
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
## 2015-11-09 LAB — LIPID PANEL
Cholesterol: 151 mg/dL (ref 125–200)
HDL: 29 mg/dL — ABNORMAL LOW (ref >=46)
LDL Cholesterol: 94 mg/dL (ref <130)
Total CHOL/HDL Ratio: 5.2 Ratio — ABNORMAL HIGH (ref <=5.0)
Triglycerides: 140 mg/dL (ref <150)
VLDL: 28 mg/dL (ref <30)

## 2015-11-09 LAB — BASIC METABOLIC PANEL WITH GFR
BUN: 23 mg/dL (ref 7–25)
CO2: 23 mmol/L (ref 20–31)
Calcium: 9.1 mg/dL (ref 8.6–10.4)
Chloride: 102 mmol/L (ref 98–110)
Creat: 1.3 mg/dL — ABNORMAL HIGH (ref 0.50–0.99)
GFR, Est African American: 50 mL/min — ABNORMAL LOW (ref >=60)
GFR, Est Non African American: 43 mL/min — ABNORMAL LOW (ref >=60)
Glucose, Bld: 109 mg/dL — ABNORMAL HIGH (ref 65–99)
Potassium: 3.6 mmol/L (ref 3.5–5.3)
Sodium: 137 mmol/L (ref 135–146)

## 2015-11-17 ENCOUNTER — Other Ambulatory Visit: Payer: Self-pay

## 2015-11-18 MED ORDER — TOPIRAMATE 50 MG PO TABS: 50.0000 mg | ORAL_TABLET | Freq: Two times a day (BID) | ORAL | Status: DC

## 2015-11-20 ENCOUNTER — Encounter: Payer: Medicare Other | Attending: Surgery | Admitting: Surgery

## 2015-11-20 DIAGNOSIS — L97222 Non-pressure chronic ulcer of left calf with fat layer exposed: Secondary | ICD-10-CM | POA: Insufficient documentation

## 2015-11-20 DIAGNOSIS — F17218 Nicotine dependence, cigarettes, with other nicotine-induced disorders: Secondary | ICD-10-CM | POA: Diagnosis not present

## 2015-11-20 DIAGNOSIS — F419 Anxiety disorder, unspecified: Secondary | ICD-10-CM | POA: Insufficient documentation

## 2015-11-20 DIAGNOSIS — R32 Unspecified urinary incontinence: Secondary | ICD-10-CM | POA: Diagnosis not present

## 2015-11-20 DIAGNOSIS — Z87891 Personal history of nicotine dependence: Secondary | ICD-10-CM | POA: Diagnosis not present

## 2015-11-20 DIAGNOSIS — G4733 Obstructive sleep apnea (adult) (pediatric): Secondary | ICD-10-CM | POA: Diagnosis not present

## 2015-11-20 DIAGNOSIS — L97512 Non-pressure chronic ulcer of other part of right foot with fat layer exposed: Secondary | ICD-10-CM | POA: Insufficient documentation

## 2015-11-20 DIAGNOSIS — G35 Multiple sclerosis: Secondary | ICD-10-CM | POA: Diagnosis not present

## 2015-11-20 DIAGNOSIS — I10 Essential (primary) hypertension: Secondary | ICD-10-CM | POA: Diagnosis not present

## 2015-11-20 DIAGNOSIS — S91301A Unspecified open wound, right foot, initial encounter: Secondary | ICD-10-CM | POA: Diagnosis not present

## 2015-11-20 DIAGNOSIS — Z79899 Other long term (current) drug therapy: Secondary | ICD-10-CM | POA: Diagnosis not present

## 2015-11-20 DIAGNOSIS — J449 Chronic obstructive pulmonary disease, unspecified: Secondary | ICD-10-CM | POA: Diagnosis not present

## 2015-11-20 DIAGNOSIS — L97212 Non-pressure chronic ulcer of right calf with fat layer exposed: Secondary | ICD-10-CM | POA: Insufficient documentation

## 2015-11-20 DIAGNOSIS — Z6841 Body Mass Index (BMI) 40.0 and over, adult: Secondary | ICD-10-CM | POA: Diagnosis not present

## 2015-11-20 DIAGNOSIS — I739 Peripheral vascular disease, unspecified: Secondary | ICD-10-CM | POA: Insufficient documentation

## 2015-11-20 NOTE — Progress Notes (Signed)
[name], [name] ([id number]) Visit Report for 11/20/2015 Abuse/Suicide Risk Screen Details Patient Name: [name], [name] Date of Service: 11/20/2015 12:45 PM Medical Record Number: [id number] Patient Account Number: [id number] Date of Birth/Sex: [birth year] (65 y.o. Female) Treating RN: Cornell Barman Primary Care Physician: LADA, [facility] Other Clinician: Referring Physician: LADA, MELINDA Treating Physician/Extender: Frann Rider in Treatment: 0 Abuse/Suicide Risk Screen Items Answer ABUSE/SUICIDE RISK SCREEN: Has anyone close to you tried to hurt or harm you recentlyo No Do you feel uncomfortable with anyone in your familyo No Has anyone forced you do things that you didnot want to doo No Do you have any thoughts of harming yourselfo No Patient displays signs or symptoms of abuse and/or neglect. No Electronic Signature(s) Signed: 11/20/2015 3:03:12 PM By: Gretta Cool RN, BSN, Kim RN, BSN Entered By: Gretta Cool, RN, BSN, Kim on 11/20/2015 13:37:09 [name] ([id number]) -------------------------------------------------------------------------------- Activities of Daily Living Details Patient Name: [name] Date of Service: 11/20/2015 12:45 PM Medical Record Number: [id number] Patient Account Number: [id number] Date of Birth/Sex: 20-Mar-1951 (65 y.o. Female) Treating RN: Cornell Barman Primary Care Physician: LADA, [facility] Other Clinician: Referring Physician: LADA, [place] Treating Physician/Extender: Frann Rider in Treatment: 0 Activities of Daily Living Items Answer Activities of Daily Living (Please select one for each item) Drive Automobile Not Able Take Medications Completely Able Use Telephone Completely Able Care for Appearance Completely Able Use Toilet Completely Able Bath / Shower Completely Able Dress Self Completely Able Feed Self Completely Able Walk Completely Able Get In / Out Bed Completely Able Housework Completely Able Prepare Meals Completely  [place] for Self Completely Able Electronic Signature(s) Signed: 11/20/2015 3:03:12 PM By: Gretta Cool, RN, BSN, Kim RN, BSN Entered By: Gretta Cool, RN, BSN, Kim on 11/20/2015 13:37:22 [name] ([id number]) -------------------------------------------------------------------------------- Education Assessment Details Patient Name: [name] Date of Service: 11/20/2015 12:45 PM Medical Record Number: [id number] Patient Account Number: [id number] Date of Birth/Sex: October 11, 1950 (65 y.o. Female) Treating RN: Cornell Barman Primary Care Physician: LADA, [facility] Other Clinician: Referring Physician: LADA, MELINDA Treating Physician/Extender: Frann Rider in Treatment: 0 Primary Learner Assessed: Patient Learning Preferences/Education Level/Primary Language Learning Preference: Explanation, Demonstration Highest Education Level: High School Preferred Language: English Cognitive Barrier Assessment/Beliefs Language Barrier: No Translator Needed: No Memory Deficit: No Emotional Barrier: No Cultural/Religious Beliefs Affecting Medical No Care: Physical Barrier Assessment Impaired Vision: Yes Glasses Impaired Hearing: No Decreased Hand dexterity: No Knowledge/Comprehension Assessment Knowledge Level: Medium Comprehension Level: Medium Ability to understand written Medium instructions: Ability to understand verbal Medium instructions: Motivation Assessment Anxiety Level: Calm Cooperation: Uncooperative Education Importance: Acknowledges Need Interest in Health Problems: Asks Questions Perception: Coherent Willingness to Engage in Self- Low Management Activities: Readiness to Engage in Self- Low Management Activities: Electronic Signature(s) [name], [name] ([id number]) Signed: 11/20/2015 3:03:12 PM By: Gretta Cool RN, BSN, Kim RN, BSN Entered By: Gretta Cool, RN, BSN, Kim on 11/20/2015 13:37:58 [name], [name]  ([id number]) -------------------------------------------------------------------------------- Fall Risk Assessment Details Patient Name: [name] Date of Service: 11/20/2015 12:45 PM Medical Record Number: [id number] Patient Account Number: [id number] Date of Birth/Sex: [birth year] (65 y.o. Female) Treating RN: Cornell Barman Primary Care Physician: LADA, [facility] Other Clinician: Referring Physician: LADA, MELINDA Treating Physician/Extender: Frann Rider in Treatment: 0 Fall Risk Assessment Items Have you had 2 or more falls in the last 12 monthso 0 Yes Have you had any fall that resulted in injury in the last 12 monthso 0 No FALL RISK ASSESSMENT: History of falling - immediate or within  3 months 0 No Secondary diagnosis 0 No Ambulatory aid None/bed rest/wheelchair/nurse 0 No Crutches/cane/walker 15 Yes Furniture 0 No IV Access/Saline Lock 0 No Gait/Training Normal/bed rest/immobile 0 No Weak 10 Yes Impaired 0 No Mental Status Oriented to own ability 0 No Electronic Signature(s) Signed: 11/20/2015 3:03:12 PM By: Gretta Cool, RN, BSN, Kim RN, BSN Entered By: Gretta Cool, RN, BSN, Kim on 11/20/2015 13:38:26 [name], [name] -------------------------------------------------------------------------------- Foot Assessment Details Patient Name: [name] Date of Service: 11/20/2015 12:45 PM Medical Record Number: [id number] Patient Account Number: [id number] Date of Birth/Sex: [birth year] (65 y.o. Female) Treating RN: Cornell Barman Primary Care Physician: LADA, [place] Other Clinician: Referring Physician: LADA, [place] Treating Physician/Extender: Frann Rider in Treatment: 0 Foot Assessment Items Site Locations + = Sensation present, - = Sensation absent, C = Callus, U = Ulcer R = Redness, W = Warmth, M = Maceration, PU = Pre-ulcerative lesion F = Fissure, S = Swelling, D = Dryness Assessment Right: Left: Other Deformity: No No Prior Foot Ulcer: No No Prior  Amputation: No No Charcot Joint: No No Ambulatory Status: Ambulatory With Help Assistance Device: Walker GaitEnergy manager) Signed: 11/20/2015 3:03:12 PM By: Gretta Cool, RN, BSN, Kim RN, BSN Entered By: Gretta Cool, RN, BSN, Kim on 11/20/2015 13:39:23 [name], [name] -------------------------------------------------------------------------------- Nutrition Risk Assessment Details Patient Name: [name] Date of Service: 11/20/2015 12:45 PM Medical Record Number: [id number] Patient Account Number: [id number] Date of Birth/Sex: [birth year] (64 y.o. Female) Treating RN: Cornell Barman Primary Care Physician: LADA, [place] Other Clinician: Referring Physician: LADA, [place] Treating Physician/Extender: Frann Rider in Treatment: 0 Height (in): 63 Weight (lbs): 270 Body Mass Index (BMI): 47.8 Nutrition Risk Assessment Items NUTRITION RISK SCREEN: I have an illness or condition that made me change the kind and/or 0 No amount of food I eat I eat fewer than two meals per day 0 No I eat few fruits and vegetables, or milk products 0 No I have three or more drinks of beer, liquor or wine almost every day 0 No I have tooth or mouth problems that make it hard for me to eat 0 No I don't always have enough money to buy the food I need 0 No I eat alone most of the time 0 No I take three or more different prescribed or over-the-counter drugs a 1 Yes day Without wanting to, I have lost or gained 10 pounds in the last six 0 No months I am not always physically able to shop, cook and/or feed myself 2 Yes Nutrition Protocols Good Risk Protocol Provide education on Moderate Risk Protocol 0 nutrition Electronic Signature(s) Signed: 11/20/2015 3:03:12 PM By: Gretta Cool, RN, BSN, Kim RN, BSN Entered By: Gretta Cool, RN, BSN, Kim on 11/20/2015 13:38:48

## 2015-11-20 NOTE — Progress Notes (Signed)
[name], [name] ([id number]) Visit Report for 11/20/2015 Allergy List Details Patient Name: [name], [name] Date of Service: 11/20/2015 12:45 PM Medical Record Number: [id number] Patient Account Number: [id number] Date of Birth/Sex: 09-19-1950 (65 y.o. Female) Treating RN: Cornell Barman Primary Care Physician: LADA, [facility] Other Clinician: Referring Physician: LADA, [employer] Treating Physician/Extender: Frann Rider in Treatment: 0 Allergies Active Allergies No Known Drug Allergies Allergy Notes Electronic Signature(s) Signed: 11/20/2015 3:03:12 PM By: Gretta Cool, RN, BSN, Kim RN, BSN Entered By: Gretta Cool, RN, BSN, Kim on 11/20/2015 13:31:40 [name] -------------------------------------------------------------------------------- Arrival Information Details Patient Name: [name] Date of Service: 11/20/2015 12:45 PM Medical Record Number: [id number] Patient Account Number: [id number] Date of Birth/Sex: [birth year] (64 y.o. Female) Treating RN: Cornell Barman Primary Care Physician: LADA, [facility] Other Clinician: Referring Physician: LADA, [place] Treating Physician/Extender: Frann Rider in Treatment: 0 Visit Information Patient Arrived: Walker Arrival Time: 13:05 Accompanied By: self Transfer Assistance: None Patient Identification Verified: Yes Secondary Verification Process Completed: Yes Patient Requires Transmission-Based No Precautions: Patient Has Alerts: Yes History Since Last Visit Electronic Signature(s) Signed: 11/20/2015 3:03:12 PM By: Gretta Cool, RN, BSN, Kim RN, BSN Entered By: Gretta Cool, RN, BSN, Kim on 11/20/2015 13:06:49 [name] -------------------------------------------------------------------------------- Clinic Level of Care Assessment Details Patient Name: [name] Date of Service: 11/20/2015 12:45 PM Medical Record Number: [id number] Patient Account Number: [id number] Date of Birth/Sex: March 23, 1951 (64 y.o.  Female) Treating RN: Cornell Barman Primary Care Physician: LADA, [facility] Other Clinician: Referring Physician: LADA, MELINDA Treating Physician/Extender: Frann Rider in Treatment: 0 Clinic Level of Care Assessment Items TOOL 2 Quantity Score []  - Use when only an EandM is performed on the INITIAL visit 0 ASSESSMENTS - Nursing Assessment / Reassessment X - General Physical Exam (combine w/ comprehensive assessment (listed just 1 20 below) when performed on new pt. evals) X - Comprehensive Assessment (HX, ROS, Risk Assessments, Wounds Hx, etc.) 1 25 ASSESSMENTS - Wound and Skin Assessment / Reassessment []  - Simple Wound Assessment / Reassessment - one wound 0 X - Complex Wound Assessment / Reassessment - multiple wounds 3 5 []  - Dermatologic / Skin Assessment (not related to wound area) 0 ASSESSMENTS - Ostomy and/or Continence Assessment and Care []  - Incontinence Assessment and Management 0 []  - Ostomy Care Assessment and Management (repouching, etc.) 0 PROCESS - Coordination of Care X - Simple Patient / Family Education for ongoing care 1 15 []  - Complex (extensive) Patient / Family Education for ongoing care 0 X - Staff obtains Programmer, systems, Records, Test Results / Process Orders 1 10 []  - Staff telephones HHA, Nursing Homes / Clarify orders / etc 0 []  - Routine Transfer to another Facility (non-emergent condition) 0 []  - Routine Hospital Admission (non-emergent condition) 0 X - New Admissions / Biomedical engineer / Ordering NPWT, Apligraf, etc. 1 15 []  - Emergency Hospital Admission (emergent condition) 0 X - Simple Discharge Coordination 1 10 [name], [name] ([id number]) []  - Complex (extensive) Discharge Coordination 0 PROCESS - Special Needs []  - Pediatric / Minor Patient Management 0 []  - Isolation Patient Management 0 []  - Hearing / Language / Visual special needs 0 []  - Assessment of Community assistance (transportation, D/C planning, etc.) 0 []  - Additional  assistance / Altered mentation 0 []  - Support Surface(s) Assessment (bed, cushion, seat, etc.) 0 INTERVENTIONS - Wound Cleansing / Measurement X - Wound Imaging (photographs - any number of wounds) 1 5 []  - Wound Tracing (instead of photographs) 0 X - Simple Wound Measurement -  one wound 1 5 X - Complex Wound Measurement - multiple wounds 3 5 []  - Simple Wound Cleansing - one wound 0 X - Complex Wound Cleansing - multiple wounds 3 5 INTERVENTIONS - Wound Dressings []  - Small Wound Dressing one or multiple wounds 0 X - Medium Wound Dressing one or multiple wounds 3 15 []  - Large Wound Dressing one or multiple wounds 0 []  - Application of Medications - injection 0 INTERVENTIONS - Miscellaneous []  - External ear exam 0 []  - Specimen Collection (cultures, biopsies, blood, body fluids, etc.) 0 []  - Specimen(s) / Culture(s) sent or taken to Lab for analysis 0 []  - Patient Transfer (multiple staff / Harrel Lemon Lift / Similar devices) 0 []  - Simple Staple / Suture removal (25 or less) 0 []  - Complex Staple / Suture removal (26 or more) 0 [name], [name] H. ([id number]) []  - Hypo / Hyperglycemic Management (close monitor of Blood Glucose) 0 X - Ankle / Brachial Index (ABI) - do not check if billed separately 1 15 Has the patient been seen at the hospital within the last three years: Yes Total Score: 210 Level Of Care: New/Established - Level 5 Electronic Signature(s) Signed: 11/20/2015 3:03:12 PM By: Gretta Cool, RN, BSN, Kim RN, BSN Entered By: Gretta Cool, RN, BSN, Kim on 11/20/2015 13:55:07 [name] ([id number]) -------------------------------------------------------------------------------- Encounter Discharge Information Details Patient Name: [name] Date of Service: 11/20/2015 12:45 PM Medical Record Number: [id number] Patient Account Number: [id number] Date of Birth/Sex: Jul 13, 1950 (64 y.o. Female) Treating RN: Cornell Barman Primary Care Physician: LADA, [facility] Other Clinician: Referring  Physician: LADA, [place] Treating Physician/Extender: Frann Rider in Treatment: 0 Encounter Discharge Information Items Discharge Pain Level: 0 Discharge Condition: Stable Ambulatory Status: Walker Discharge Destination: Home Transportation: Private Auto Accompanied By: self Schedule Follow-up Appointment: Yes Medication Reconciliation completed Yes and provided to Patient/Care Naziya Hegwood: Provided on Clinical Summary of Care: 11/20/2015 Form Type Recipient Paper Patient BW Electronic Signature(s) Signed: 11/20/2015 2:08:11 PM By: Ruthine Dose Entered By: Ruthine Dose on 11/20/2015 14:08:11 [name] ([id number]) -------------------------------------------------------------------------------- Lower Extremity Assessment Details Patient Name: [name] Date of Service: 11/20/2015 12:45 PM Medical Record Number: [id number] Patient Account Number: [id number] Date of Birth/Sex: Nov 26, 1950 (64 y.o. Female) Treating RN: Cornell Barman Primary Care Physician: LADA, [facility] Other Clinician: Referring Physician: LADA, MELINDA Treating Physician/Extender: Frann Rider in Treatment: 0 Vascular Assessment Pulses: Posterior Tibial Palpable: [Left:Yes] [Right:Yes] Doppler: [Left:Multiphasic] [Right:Multiphasic] Dorsalis Pedis Palpable: [Left:Yes] [Right:Yes] Doppler: [Left:Multiphasic] [Right:Monophasic] Extremity colors, hair growth, and conditions: Extremity Color: [Left:Normal] [Right:Normal] Hair Growth on Extremity: [Left:No] [Right:No] Temperature of Extremity: [Left:Cool] [Right:Cool] Capillary Refill: [Left:< 3 seconds] [Right:< 3 seconds] Blood Pressure: Brachial: [Left:160] [Right:161] Dorsalis Pedis: 130 [Left:Dorsalis Pedis: 140] Ankle: Posterior Tibial: [Left:Posterior Tibial: 0.81] [Right:0.87] Toe Nail Assessment Left: Right: Thick: No No Discolored: Yes Yes Deformed: Yes Yes Improper Length and Hygiene: No No Electronic  Signature(s) Signed: 11/20/2015 3:03:12 PM By: Gretta Cool, RN, BSN, Kim RN, BSN Entered By: Gretta Cool, RN, BSN, Kim on 11/20/2015 13:31:30 [name] ([id number]) -------------------------------------------------------------------------------- Multi Wound Chart Details Patient Name: [name] Date of Service: 11/20/2015 12:45 PM Medical Record Number: [id number] Patient Account Number: [id number] Date of Birth/Sex: 10-04-1950 (64 y.o. Female) Treating RN: Cornell Barman Primary Care Physician: LADA, [facility] Other Clinician: Referring Physician: LADA, MELINDA Treating Physician/Extender: Frann Rider in Treatment: 0 Vital Signs Height(in): 63 Pulse(bpm): 71 Weight(lbs): 270 Blood Pressure 161/65 (mmHg): Body Mass Index(BMI): 48 Temperature(F): 97.7 Respiratory Rate 20 (breaths/min): Photos: Wound Location: Right Foot - Dorsal  Right Lower Leg Left Lower Leg - Lateral Wounding Event: Trauma Trauma Trauma Primary Etiology: Trauma, Other Trauma, Other Venous Leg Ulcer Date Acquired: 07/21/2015 10/09/2015 10/09/2015 Weeks of Treatment: 0 0 0 Wound Status: Open Open Open Measurements L x W x D 0.5x1x0.2 0.9x0.7x0.3 0.3x0.4x0.1 (cm) Area (cm) : 0.393 0.495 0.094 Volume (cm) : 0.079 0.148 0.009 Classification: Partial Thickness Partial Thickness Partial Thickness Exudate Amount: Medium Medium Medium Exudate Type: Serous Serous Serous Exudate Color: amber amber amber Wound Margin: Flat and Intact Flat and Intact Flat and Intact Granulation Amount: Small (1-33%) None Present (0%) None Present (0%) Granulation Quality: Red N/A N/A Necrotic Amount: Medium (34-66%) Large (67-100%) Large (67-100%) Necrotic Tissue: Adherent Slough Eschar, Adherent Slough Adherent Slough Exposed Structures: Fascia: No Fascia: No Fascia: No Fat: No Fat: No Fat: No Tendon: No Tendon: No Tendon: No Muscle: No Muscle: No Muscle: No [name], [name] H. ([id number]) Joint: No Joint: No Joint:  No Bone: No Bone: No Bone: No Limited to Skin Limited to Skin Limited to Skin Breakdown Breakdown Breakdown Epithelialization: None None None Periwound Skin Texture: Edema: Yes Edema: Yes Edema: Yes Excoriation: No Excoriation: No Excoriation: No Induration: No Induration: No Induration: No Callus: No Callus: No Callus: No Crepitus: No Crepitus: No Crepitus: No Fluctuance: No Fluctuance: No Fluctuance: No Friable: No Friable: No Friable: No Rash: No Rash: No Rash: No Scarring: No Scarring: No Scarring: No Periwound Skin Maceration: No Maceration: No Maceration: No Moisture: Moist: No Moist: No Moist: No Dry/Scaly: No Dry/Scaly: No Dry/Scaly: No Periwound Skin Color: Ecchymosis: Yes Atrophie Blanche: No Atrophie Blanche: No Atrophie Blanche: No Cyanosis: No Cyanosis: No Cyanosis: No Ecchymosis: No Ecchymosis: No Erythema: No Erythema: No Erythema: No Hemosiderin Staining: No Hemosiderin Staining: No Hemosiderin Staining: No Mottled: No Mottled: No Mottled: No Pallor: No Pallor: No Pallor: No Rubor: No Rubor: No Rubor: No Tenderness on No No No Palpation: Wound Preparation: Ulcer Cleansing: Ulcer Cleansing: Ulcer Cleansing: Rinsed/Irrigated with Rinsed/Irrigated with Rinsed/Irrigated with Saline Saline Saline Topical Anesthetic Topical Anesthetic Topical Anesthetic Applied: Other: lidocaine Applied: Other: lidocaine Applied: Other: lidocaine 4% 4% 4% Treatment Notes Electronic Signature(s) Signed: 11/20/2015 3:03:12 PM By: Gretta Cool, RN, BSN, Kim RN, BSN Entered By: Gretta Cool, RN, BSN, Kim on 11/20/2015 13:41:01 [name], [name] -------------------------------------------------------------------------------- Multi-Disciplinary Care Plan Details Patient Name: [name] Date of Service: 11/20/2015 12:45 PM Medical Record Number: [id number] Patient Account Number: [id number] Date of Birth/Sex: June 03, 1950 (64 y.o. Female) Treating RN:  Cornell Barman Primary Care Physician: LADA, [facility] Other Clinician: Referring Physician: LADA, MELINDA Treating Physician/Extender: Frann Rider in Treatment: 0 Active Inactive Abuse / Safety / Falls / Self Care Management Nursing Diagnoses: Impaired physical mobility Potential for falls Goals: Patient will remain injury free Date Initiated: 11/20/2015 Goal Status: Active Interventions: Assess fall risk on admission and as needed Notes: Nutrition Nursing Diagnoses: Imbalanced nutrition Goals: Patient/caregiver agrees to and verbalizes understanding of need to use nutritional supplements and/or vitamins as prescribed Date Initiated: 11/20/2015 Goal Status: Active Interventions: Provide education on nutrition Notes: Orientation to the Wound Care Program Nursing Diagnoses: Knowledge deficit related to the wound healing center program Goals: [name], [name] Patient/caregiver will verbalize understanding of the [facility] Date Initiated: 11/20/2015 Goal Status: Active Interventions: Provide education on orientation to the wound center Notes: Wound/Skin Impairment Nursing Diagnoses: Impaired tissue integrity Goals: Patient will demonstrate a reduced rate of smoking or cessation of smoking Date Initiated: 11/20/2015 Goal Status: Active Ulcer/skin breakdown will heal within 14 weeks Date  Initiated: 11/20/2015 Goal Status: Active Interventions: Assess patient/caregiver ability to obtain necessary supplies Provide education on smoking Notes: Electronic Signature(s) Signed: 11/20/2015 3:03:12 PM By: Gretta Cool, RN, BSN, Kim RN, BSN Entered By: Gretta Cool, RN, BSN, Kim on 11/20/2015 13:40:39 [name] ([id number]) -------------------------------------------------------------------------------- Pain Assessment Details Patient Name: [name] Date of Service: 11/20/2015 12:45 PM Medical Record Number: [id number] Patient Account Number:  [id number] Date of Birth/Sex: [birth year] (64 y.o. Female) Treating RN: Cornell Barman Primary Care Physician: LADA, [facility] Other Clinician: Referring Physician: LADA, [place] Treating Physician/Extender: Frann Rider in Treatment: 0 Active Problems Location of Pain Severity and Description of Pain Patient Has Paino No Site Locations With Dressing Change: No Pain Management and Medication Current Pain Management: Electronic Signature(s) Signed: 11/20/2015 3:03:12 PM By: Gretta Cool, RN, BSN, Kim RN, BSN Entered By: Gretta Cool, RN, BSN, Kim on 11/20/2015 13:06:57 [name] ([id number]) -------------------------------------------------------------------------------- Patient/Caregiver Education Details Patient Name: [name] Date of Service: 11/20/2015 12:45 PM Medical Record Number: [id number] Patient Account Number: [id number] Date of Birth/Gender: Sep 26, 1950 (64 y.o. Female) Treating RN: Cornell Barman Primary Care Physician: LADA, [facility] Other Clinician: Referring Physician: LADA, [place] Treating Physician/Extender: Frann Rider in Treatment: 0 Education Assessment Education Provided To: Patient Education Topics Provided Wound/Skin Impairment: Handouts: Caring for Your Ulcer, Other: wound care as prescribed Methods: Demonstration, Explain/Verbal Responses: State content correctly Electronic Signature(s) Signed: 11/20/2015 3:03:12 PM By: Gretta Cool, RN, BSN, Kim RN, BSN Entered By: Gretta Cool, RN, BSN, Kim on 11/20/2015 13:57:11 [name] ([id number]) -------------------------------------------------------------------------------- Wound Assessment Details Patient Name: [name] Date of Service: 11/20/2015 12:45 PM Medical Record Number: [id number] Patient Account Number: [id number] Date of Birth/Sex: 1950-08-28 (64 y.o. Female) Treating RN: Cornell Barman Primary Care Physician: LADA, [place] Other Clinician: Referring Physician: LADA, MELINDA Treating  Physician/Extender: Frann Rider in Treatment: 0 Wound Status Wound Number: 2 Primary Etiology: Trauma, Other Wound Location: Right Foot - Dorsal Wound Status: Open Wounding Event: Trauma Date Acquired: 07/21/2015 Weeks Of Treatment: 0 Clustered Wound: No Photos Wound Measurements Length: (cm) 0.5 Width: (cm) 1 Depth: (cm) 0.2 Area: (cm) 0.393 Volume: (cm) 0.079 % Reduction in Area: % Reduction in Volume: Epithelialization: None Tunneling: No Undermining: No Wound Description Classification: Partial Thickness Wound Margin: Flat and Intact Exudate Amount: Medium Exudate Type: Serous Exudate Color: amber Wound Bed Granulation Amount: Small (1-33%) Exposed Structure Granulation Quality: Red Fascia Exposed: No Necrotic Amount: Medium (34-66%) Fat Layer Exposed: No Necrotic Quality: Adherent Slough Tendon Exposed: No Muscle Exposed: No [place]ano, Jaydah H. ([id number]) Joint Exposed: No Bone Exposed: No Limited to Skin Breakdown Periwound Skin Texture Texture Color No Abnormalities Noted: No No Abnormalities Noted: No Callus: No Atrophie Blanche: No Crepitus: No Cyanosis: No Excoriation: No Ecchymosis: Yes Fluctuance: No Erythema: No Friable: No Hemosiderin Staining: No Induration: No Mottled: No Localized Edema: Yes Pallor: No Rash: No Rubor: No Scarring: No Moisture No Abnormalities Noted: No Dry / Scaly: No Maceration: No Moist: No Wound Preparation Ulcer Cleansing: Rinsed/Irrigated with Saline Topical Anesthetic Applied: Other: lidocaine 4%, Treatment Notes Wound #2 (Right, Dorsal Foot) 1. Cleansed with: Clean wound with Normal Saline Cleanse wound with antibacterial soap and water 2. Anesthetic Topical Lidocaine 4% cream to wound bed prior to debridement 3. Peri-wound Care: Barrier cream 4. Dressing Applied: [name] Gel 5. Secondary Dressing Applied Bordered Foam Dressing Electronic Signature(s) Signed: 11/20/2015 3:03:12 PM  By: Gretta Cool, RN, BSN, Kim RN, BSN Entered By: Gretta Cool, RN, BSN, Kim on 11/20/2015 13:22:17 [name] ([id number]) -------------------------------------------------------------------------------- Wound Assessment Details Patient Name:  [name] Date of Service: 11/20/2015 12:45 PM Medical Record Number: [id number] Patient Account Number: [id number] Date of Birth/Sex: 1950/08/21 (65 y.o. Female) Treating RN: Cornell Barman Primary Care Physician: LADA, [place] Other Clinician: Referring Physician: LADA, MELINDA Treating Physician/Extender: Frann Rider in Treatment: 0 Wound Status Wound Number: 3 Primary Etiology: Trauma, Other Wound Location: Right Lower Leg Wound Status: Open Wounding Event: Trauma Date Acquired: 10/09/2015 Weeks Of Treatment: 0 Clustered Wound: No Photos Wound Measurements Length: (cm) 0.9 Width: (cm) 0.7 Depth: (cm) 0.3 Area: (cm) 0.495 Volume: (cm) 0.148 % Reduction in Area: % Reduction in Volume: Epithelialization: None Tunneling: No Undermining: No Wound Description Classification: Partial Thickness Wound Margin: Flat and Intact Exudate Amount: Medium Exudate Type: Serous Exudate Color: amber Wound Bed Granulation Amount: None Present (0%) Exposed Structure Necrotic Amount: Large (67-100%) Fascia Exposed: No Necrotic Quality: Eschar, Adherent Slough Fat Layer Exposed: No Tendon Exposed: No Muscle Exposed: No [place], Opaline H. ([id number]) Joint Exposed: No Bone Exposed: No Limited to Skin Breakdown Periwound Skin Texture Texture Color No Abnormalities Noted: No No Abnormalities Noted: No Callus: No Atrophie Blanche: No Crepitus: No Cyanosis: No Excoriation: No Ecchymosis: No Fluctuance: No Erythema: No Friable: No Hemosiderin Staining: No Induration: No Mottled: No Localized Edema: Yes Pallor: No Rash: No Rubor: No Scarring: No Moisture No Abnormalities Noted: No Dry / Scaly: No Maceration: No Moist: No Wound  Preparation Ulcer Cleansing: Rinsed/Irrigated with Saline Topical Anesthetic Applied: Other: lidocaine 4%, Treatment Notes Wound #3 (Right Lower Leg) 1. Cleansed with: Clean wound with Normal Saline Cleanse wound with antibacterial soap and water 2. Anesthetic Topical Lidocaine 4% cream to wound bed prior to debridement 3. Peri-wound Care: Barrier cream 4. Dressing Applied: [name] Gel 5. Secondary Dressing Applied Bordered Foam Dressing Electronic Signature(s) Signed: 11/20/2015 3:03:12 PM By: Gretta Cool RN, BSN, Kim RN, BSN Entered By: Gretta Cool, RN, BSN, Kim on 11/20/2015 13:24:19 [name] -------------------------------------------------------------------------------- Wound Assessment Details Patient Name: [name] Date of Service: 11/20/2015 12:45 PM Medical Record Number: [id number] Patient Account Number: [id number] Date of Birth/Sex: 06-12-1950 (64 y.o. Female) Treating RN: Cornell Barman Primary Care Physician: LADA, [place] Other Clinician: Referring Physician: LADA, MELINDA Treating Physician/Extender: Frann Rider in Treatment: 0 Wound Status Wound Number: 4 Primary Etiology: Venous Leg Ulcer Wound Location: Left Lower Leg - Lateral Wound Status: Open Wounding Event: Trauma Date Acquired: 10/09/2015 Weeks Of Treatment: 0 Clustered Wound: No Photos Wound Measurements Length: (cm) 0.3 Width: (cm) 0.4 Depth: (cm) 0.1 Area: (cm) 0.094 Volume: (cm) 0.009 % Reduction in Area: % Reduction in Volume: Epithelialization: None Tunneling: No Undermining: No Wound Description Classification: Partial Thickness Wound Margin: Flat and Intact Exudate Amount: Medium Exudate Type: Serous Exudate Color: amber Wound Bed Granulation Amount: None Present (0%) Exposed Structure Necrotic Amount: Large (67-100%) Fascia Exposed: No Necrotic Quality: Adherent Slough Fat Layer Exposed: No Tendon Exposed: No Muscle Exposed: No Zajicek, Jupiter H.  ([id number]) Joint Exposed: No Bone Exposed: No Limited to Skin Breakdown Periwound Skin Texture Texture Color No Abnormalities Noted: No No Abnormalities Noted: No Callus: No Atrophie Blanche: No Crepitus: No Cyanosis: No Excoriation: No Ecchymosis: No Fluctuance: No Erythema: No Friable: No Hemosiderin Staining: No Induration: No Mottled: No Localized Edema: Yes Pallor: No Rash: No Rubor: No Scarring: No Moisture No Abnormalities Noted: No Dry / Scaly: No Maceration: No Moist: No Wound Preparation Ulcer Cleansing: Rinsed/Irrigated with Saline Topical Anesthetic Applied: Other: lidocaine 4%, Treatment Notes Wound #4 (Left, Lateral Lower Leg) 1. Cleansed with: Clean wound with Normal  Saline Cleanse wound with antibacterial soap and water 2. Anesthetic Topical Lidocaine 4% cream to wound bed prior to debridement 3. Peri-wound Care: Barrier cream 4. Dressing Applied: [name] Gel 5. Secondary Dressing Applied Bordered Foam Dressing Electronic Signature(s) Signed: 11/20/2015 3:03:12 PM By: Gretta Cool RN, BSN, Kim RN, BSN Entered By: Gretta Cool, RN, BSN, Kim on 11/20/2015 13:26:04 EVELENA, HUGULEY ([id number]) -------------------------------------------------------------------------------- [place] Details Patient Name: [name] Date of Service: 11/20/2015 12:45 PM Medical Record Number: [id number] Patient Account Number: [id number] Date of Birth/Sex: November 22, 1950 (64 y.o. Female) Treating RN: Cornell Barman Primary Care Physician: LADA, [place] Other Clinician: Referring Physician: LADA, [place] Treating Physician/Extender: Frann Rider in Treatment: 0 Vital Signs Time Taken: 13:06 Temperature (F): 97.7 Height (in): 63 Pulse (bpm): 71 Source: Stated Respiratory Rate (breaths/min): 20 Weight (lbs): 270 Blood Pressure (mmHg): 161/65 Source: Stated Reference Range: 80 - 120 mg / dl Body Mass Index (BMI): 47.8 Electronic Signature(s) Signed: 11/20/2015  3:03:12 PM By: Gretta Cool, RN, BSN, Kim RN, BSN Entered By: Gretta Cool, RN, BSN, Kim on 11/20/2015 13:07:33

## 2015-11-20 NOTE — Progress Notes (Addendum)
PHEONIX, WISBY ([id number]) Visit Report for 11/20/2015 Chief Complaint Document Details Patient Name: [name], [name] Date of Service: 11/20/2015 12:45 PM Medical Record Number: [id number] Patient Account Number: [id number] Date of Birth/Sex: 1950/07/29 (65 y.o. Female) Treating RN: Cornell Barman Primary Care Physician: LADA, [facility] Other Clinician: Referring Physician: LADA, [place] Treating Physician/Extender: Frann Rider in Treatment: 0 Information Obtained from: Patient Chief Complaint Patient seen for complaints of Non-Healing Wound to the left and right lower extremity and the right dorsum of the foot for about 6 months now Electronic Signature(s) Signed: 11/20/2015 2:03:28 PM By: Christin Fudge MD, FACS Entered By: Christin Fudge on 11/20/2015 14:03:27 [name] ([id number]) -------------------------------------------------------------------------------- HPI Details Patient Name: [name] Date of Service: 11/20/2015 12:45 PM Medical Record Number: [id number] Patient Account Number: [id number] Date of Birth/Sex: Nov 09, 1950 (64 y.o. Female) Treating RN: Cornell Barman Primary Care Physician: LADA, [facility] Other Clinician: Referring Physician: LADA, MELINDA Treating Physician/Extender: Frann Rider in Treatment: 0 History of Present Illness Location: left and right lower extremity and dorsum of the right foot Quality: Patient reports experiencing a sharp pain to affected area(s). Severity: Patient states wound are getting worse. Duration: Patient has had the wound for > 6 months prior to seeking treatment at the wound center Timing: Pain in wound is Intermittent (comes and goes Context: The wound appeared gradually over time Modifying Factors: Other treatment(s) tried include: symptomatic treatment as advised by her PCP Associated Signs and Symptoms: Patient reports having increase swelling. HPI Description: 65 year old with a past medical history significant  for MS, urinary incontinence, and obesity. She has been seen in the wound clinic before for lower extremity ulcerations treated with compression therapy. she is also known to have hypertension, peripheral vascular disease, COPD, obstructive sleep apnea, lumbar radiculopathy, kyphoscoliosis, urinary issues and tobacco abuse. Knox a packet of cigarettes a day was recently seen at the [facility] for swelling of her legs and feet with a ulceration on the dorsum of the right foot which has been there for about 6 months. she was recently in the ER about a month ago where she was seen for shortness of breath and swelling of the legs and a chest x-ray was within normal limits had an increase in her BNP and was given Lasix and put on doxycycline for a mild cellulitis and possible UTI. Electronic Signature(s) Signed: 11/20/2015 2:04:38 PM By: Christin Fudge MD, FACS Previous Signature: 11/20/2015 1:13:04 PM Version By: Christin Fudge MD, FACS Previous Signature: 11/20/2015 1:12:25 PM Version By: Christin Fudge MD, FACS Entered By: Christin Fudge on 11/20/2015 14:04:37 [name] ([id number]) -------------------------------------------------------------------------------- Physical Exam Details Patient Name: [name] Date of Service: 11/20/2015 12:45 PM Medical Record Number: [id number] Patient Account Number: [id number] Date of Birth/Sex: 08/07/1950 (64 y.o. Female) Treating RN: Cornell Barman Primary Care Physician: LADA, [facility] Other Clinician: Referring Physician: LADA, MELINDA Treating Physician/Extender: Frann Rider in Treatment: 0 Constitutional . Pulse regular. Respirations normal and unlabored. Afebrile. . Eyes Nonicteric. Reactive to light. Ears, Nose, Mouth, and Throat Lips, teeth, and gums WNL.Marland Kitchen Moist mucosa without lesions. Neck supple and nontender. No palpable supraclavicular or cervical adenopathy. Normal sized without goiter. Respiratory WNL. No  retractions.. Cardiovascular Pedal Pulses WNL. B on the left is 0.81 and the right is 0.87. No clubbing, cyanosis or edema. Chest Breasts symmetical and no nipple discharge.. Breast tissue WNL, no masses, lumps, or tenderness.. Gastrointestinal (GI) Abdomen without masses or tenderness.. No liver or spleen enlargement or tenderness.. Lymphatic No adneopathy.  No adenopathy. No adenopathy. Musculoskeletal Adexa without tenderness or enlargement.. Digits and nails w/o clubbing, cyanosis, infection, petechiae, ischemia, or inflammatory conditions.. Integumentary (Hair, Skin) No suspicious lesions. No crepitus or fluctuance. No peri-wound warmth or erythema. No masses.Marland Kitchen Psychiatric Judgement and insight Intact.. No evidence of depression, anxiety, or agitation.. Notes On dorsum of her right foot she has a superficial ulceration with some dry eschar at the base and no evidence of surrounding cellulitis. on the right lower lateral calf and the left lower lateral calf she has few small ulcerated areas possibly from injury due to her walker. Was started with moist saline gauze and no debridement required Electronic Signature(s) Signed: 11/20/2015 2:06:14 PM By: Christin Fudge MD, FACS Entered By: Christin Fudge on 11/20/2015 14:06:14 KEARA, PAGLIARULO ([id number]) PARA, COSSEY ([id number]) -------------------------------------------------------------------------------- Physician Orders Details Patient Name: [name] Date of Service: 11/20/2015 12:45 PM Medical Record Number: [id number] Patient Account Number: [id number] Date of Birth/Sex: 1950-09-12 (64 y.o. Female) Treating RN: Cornell Barman Primary Care Physician: LADA, [facility] Other Clinician: Referring Physician: LADA, MELINDA Treating Physician/Extender: Frann Rider in Treatment: 0 Verbal / Phone Orders: Yes Clinician: Cornell Barman Read Back and Verified: Yes Diagnosis Coding ICD-10 Coding Code Description G35 Multiple  sclerosis E66.01 Morbid (severe) obesity due to excess calories I89.0 Lymphedema, not elsewhere classified F17.218 Nicotine dependence, cigarettes, with other nicotine-induced disorders Wound Cleansing Wound #2 Right,Dorsal Foot o May Shower, gently pat wound dry prior to applying new dressing. Wound #3 Right Lower Leg o May Shower, gently pat wound dry prior to applying new dressing. Wound #4 Left,Lateral Lower Leg o May Shower, gently pat wound dry prior to applying new dressing. Anesthetic Wound #2 Right,Dorsal Foot o Topical Lidocaine 4% cream applied to wound bed prior to debridement - in clinic only Wound #3 Right Lower Leg o Topical Lidocaine 4% cream applied to wound bed prior to debridement - in clinic only Wound #4 Left,Lateral Lower Leg o Topical Lidocaine 4% cream applied to wound bed prior to debridement - in clinic only Skin Barriers/Peri-Wound Care Wound #2 Right,Dorsal Foot o Skin Prep Wound #3 Right Lower Leg o Skin Prep Wound #4 Left,Lateral Lower Leg o Skin Prep GERMAINE, RIPP. ([id number]) Primary Wound Dressing Wound #2 Right,Dorsal Foot o Medihoney gel Wound #3 Right Lower Leg o Medihoney gel Wound #4 Left,Lateral Lower Leg o Medihoney gel Secondary Dressing Wound #2 Right,Dorsal Foot o Boardered Foam Dressing Wound #3 Right Lower Leg o Boardered Foam Dressing Wound #4 Left,Lateral Lower Leg o Boardered Foam Dressing Dressing Change Frequency Wound #2 Right,Dorsal Foot o Change dressing every day. Wound #3 Right Lower Leg o Change dressing every day. Wound #4 Left,Lateral Lower Leg o Change dressing every day. Follow-up Appointments Wound #2 Right,Dorsal Foot o Return Appointment in 1 week. Wound #3 Right Lower Leg o Return Appointment in 1 week. Wound #4 Left,Lateral Lower Leg o Return Appointment in 1 week. Additional Orders / Instructions Wound #2 Right,Dorsal Foot o Stop Smoking o Increase  protein intake. Wound #3 Right Lower Leg Bayer, Mirelle H. ([id number]) o Stop Smoking o Increase protein intake. Wound #4 Left,Lateral Lower Leg o Stop Smoking o Increase protein intake. Medications-please add to medication list. Wound #2 Right,Dorsal Foot o Other: - Medihoney/Manuka Honey Wound #3 Right Lower Leg o Other: - Medihoney/Manuka Honey Wound #4 Left,Lateral Lower Leg o Other: - Medihoney/Manuka Honey Electronic Signature(s) Signed: 11/20/2015 3:03:12 PM By: Gretta Cool RN, BSN, Kim RN, BSN Signed: 11/20/2015 4:20:18 PM By: Christin Fudge MD, FACS Entered  By: Gretta Cool, RN, BSN, Kim on 11/20/2015 13:52:48 [name] ([id number]) -------------------------------------------------------------------------------- Problem List Details Patient Name: [name] Date of Service: 11/20/2015 12:45 PM Medical Record Number: [id number] Patient Account Number: [id number] Date of Birth/Sex: 10/13/1950 (64 y.o. Female) Treating RN: Primary Care Physician: LADA, [place] Other Clinician: Referring Physician: LADA, MELINDA Treating Physician/Extender: Weeks in Treatment: 0 Active Problems ICD-10 Encounter Code Description Active Date Diagnosis G35 Multiple sclerosis 11/20/2015 Yes E66.01 Morbid (severe) obesity due to excess calories 11/20/2015 Yes L97.512 Non-pressure chronic ulcer of other part of right foot with 11/20/2015 Yes fat layer exposed L97.212 Non-pressure chronic ulcer of right calf with fat layer 11/20/2015 Yes exposed L97.222 Non-pressure chronic ulcer of left calf with fat layer 11/20/2015 Yes exposed F17.218 Nicotine dependence, cigarettes, with other nicotine- 11/20/2015 Yes induced disorders Inactive Problems Resolved Problems Electronic Signature(s) Signed: 11/20/2015 2:02:47 PM By: Christin Fudge MD, FACS Previous Signature: 11/20/2015 2:02:39 PM Version By: Christin Fudge MD, FACS Previous Signature: 11/20/2015 2:01:07 PM Version By: Christin Fudge MD,  FACS Previous Signature: 11/20/2015 1:54:07 PM Version By: Christin Fudge MD, FACS Previous Signature: 11/20/2015 1:33:53 PM Version By: Christin Fudge MD, FACS Entered By: Christin Fudge on 11/20/2015 14:02:47 Biel, [name] ([id number] [name] ([id number]) -------------------------------------------------------------------------------- Progress Note Details Patient Name: [name] Date of Service: 11/20/2015 12:45 PM Medical Record Number: [id number] Patient Account Number: [id number] Date of Birth/Sex: [birth year] (64 y.o. Female) Treating RN: Cornell Barman Primary Care Physician: LADA, [facility] Other Clinician: Referring Physician: LADA, MELINDA Treating Physician/Extender: Frann Rider in Treatment: 0 Subjective Chief Complaint Information obtained from Patient Patient seen for complaints of Non-Healing Wound to the left and right lower extremity and the right dorsum of the foot for about 6 months now History of Present Illness (HPI) The following HPI elements were documented for the patient's wound: Location: left and right lower extremity and dorsum of the right foot Quality: Patient reports experiencing a sharp pain to affected area(s). Severity: Patient states wound are getting worse. Duration: Patient has had the wound for > 6 months prior to seeking treatment at the wound center Timing: Pain in wound is Intermittent (comes and goes Context: The wound appeared gradually over time Modifying Factors: Other treatment(s) tried include: symptomatic treatment as advised by her PCP Associated Signs and Symptoms: Patient reports having increase swelling. 65 year old with a past medical history significant for MS, urinary incontinence, and obesity. She has been seen in the wound clinic before for lower extremity ulcerations treated with compression therapy. she is also known to have hypertension, peripheral vascular disease, COPD, obstructive sleep apnea,  lumbar radiculopathy, kyphoscoliosis, urinary issues and tobacco abuse. Knox a packet of cigarettes a day was recently seen at the [facility] for swelling of her legs and feet with a ulceration on the dorsum of the right foot which has been there for about 6 months. she was recently in the ER about a month ago where she was seen for shortness of breath and swelling of the legs and a chest x-ray was within normal limits had an increase in her BNP and was given Lasix and put on doxycycline for a mild cellulitis and possible UTI. Wound History Patient presents with 3 open wounds that have been present for approximately 107mo. Patient has been treating wounds in the following manner: neosporin. Laboratory tests have been performed in the last month. Patient reportedly has not tested positive for an antibiotic resistant organism. Patient reportedly has not tested positive for osteomyelitis. Patient History Information  obtained from Patient. Allergies No Known Drug Allergies SHARDEA, CWYNAR ([id number]) Family History Diabetes - Mother, Hypertension - Mother, Father, Lung Disease - Mother, Stroke - Maternal Grandparents, Thyroid Problems - Siblings, No family history of Cancer, Heart Disease, Hereditary Spherocytosis, Kidney Disease, Seizures, Tuberculosis. Social History Current every day smoker, Marital Status - Separated, Alcohol Use - Never, Drug Use - Current History - marijuana, Caffeine Use - Daily. Medical History Eyes Patient has history of Cataracts Denies history of Glaucoma, Optic Neuritis Ear/Nose/Mouth/Throat Denies history of Chronic sinus problems/congestion, Middle ear problems Hematologic/Lymphatic Denies history of Anemia, Hemophilia, Human Immunodeficiency Virus, Lymphedema, Sickle Cell Disease Respiratory Patient has history of Chronic Obstructive Pulmonary Disease (COPD) Denies history of Aspiration, Asthma, Pneumothorax,  Tuberculosis Cardiovascular Patient has history of Hypertension Denies history of Angina, Arrhythmia, Congestive Heart Failure, Coronary Artery Disease, Deep Vein Thrombosis, Hypotension, Myocardial Infarction, Peripheral Arterial Disease, Peripheral Venous Disease, Phlebitis, Vasculitis Gastrointestinal Denies history of Cirrhosis , Colitis, Crohn s, Hepatitis A, Hepatitis B, Hepatitis C Endocrine Denies history of Type I Diabetes, Type II Diabetes Genitourinary Denies history of End Stage Renal Disease Immunological Denies history of Lupus Erythematosus, Raynaud s Integumentary (Skin) Denies history of History of Burn, History of pressure wounds Musculoskeletal Denies history of Gout, Rheumatoid Arthritis Neurologic Denies history of Dementia, Quadriplegia, Paraplegia, Seizure Disorder Oncologic Denies history of Received Chemotherapy, Received Radiation Psychiatric Denies history of Anorexia/bulimia, Confinement Anxiety Hospitalization/Surgery History - tonsillectomy. - pilonidal cyst surgery. - spinal decompression w/ steel screws. Medical And Surgical History Notes PETER, DAQUILA ([id number]) Musculoskeletal MS Degenerative Bone Disease Review of Systems (ROS) Constitutional Symptoms (General Health) The patient has no complaints or symptoms. Eyes The patient has no complaints or symptoms. Ear/Nose/Mouth/Throat The patient has no complaints or symptoms. Hematologic/Lymphatic The patient has no complaints or symptoms. Respiratory The patient has no complaints or symptoms. Cardiovascular The patient has no complaints or symptoms. Gastrointestinal The patient has no complaints or symptoms. Endocrine The patient has no complaints or symptoms. Genitourinary The patient has no complaints or symptoms. Immunological The patient has no complaints or symptoms. Integumentary (Skin) Complains or has symptoms of Wounds. Denies complaints or symptoms of Bleeding or  bruising tendency, Breakdown, Swelling. Musculoskeletal The patient has no complaints or symptoms. Neurologic The patient has no complaints or symptoms. Oncologic The patient has no complaints or symptoms. Psychiatric Complains or has symptoms of Anxiety - depression. Denies complaints or symptoms of Claustrophobia. Medications biotin oral oral Avonex 30 mcg/0.5 mL intramuscular pen kit intramuscular pen injector kit intramuscular seekly Avonex 30 mcg/0.5 mL intramuscular pen kit intramuscular pen injector kit intramuscular seekly Ampyra 10 mg tablet,extended release oral 1 1 tablet extended release 12 hr oral two times daily clonazepam 1 mg tablet oral 1 1 tablet oral two times daily gabapentin 600 mg tablet oral 1 1 tablet oral three times daily topiramate 50 mg tablet oral 1 1 tablet oral two times daily Seroquel XR 150 mg tablet,extended release oral 1 1 tablet extended release 24 hr oral nightly Centravites 50 Plus tablet oral 1 1 tablet oral daily Veras, Juelz H. ([id number]) polyethylene glycol 3350 17 gram oral powder packet oral powder in packet oral bupropion HCl XL 300 mg 24 hr tablet, extended release oral 1 1 tablet extended release 24 hr oral daily naproxen sodium 220 mg tablet oral 2 2 tablet oral (440 mg.) four times daily potassium chloride ER 10 mEq tablet,extended release oral 1 1 tablet extended release oral triamterene 37.5 mg-hydrochlorothiazide 25 mg tablet oral tablet oral duloxetine  60 mg capsule,delayed release oral capsule,delayed release(DR/EC) oral tizanidine 4 mg tablet oral 1 1 tablet oral four times daily Vesicare 10 mg tablet oral tablet oral Vitamin D3 1,000 unit tablet oral tablet oral Objective Constitutional Pulse regular. Respirations normal and unlabored. Afebrile. Vitals Time Taken: 1:06 PM, Height: 63 in, Source: Stated, Weight: 270 lbs, Source: Stated, BMI: 47.8, Temperature: 97.7 F, Pulse: 71 bpm, Respiratory Rate: 20 breaths/min, Blood  Pressure: 161/65 mmHg. Eyes Nonicteric. Reactive to light. Ears, Nose, Mouth, and Throat Lips, teeth, and gums WNL.Marland Kitchen Moist mucosa without lesions. Neck supple and nontender. No palpable supraclavicular or cervical adenopathy. Normal sized without goiter. Respiratory WNL. No retractions.. Cardiovascular Pedal Pulses WNL. B on the left is 0.81 and the right is 0.87. No clubbing, cyanosis or edema. Chest Breasts symmetical and no nipple discharge.. Breast tissue WNL, no masses, lumps, or tenderness.. Gastrointestinal (GI) Abdomen without masses or tenderness.. No liver or spleen enlargement or tenderness.. Lymphatic No adneopathy. No adenopathy. No adenopathy. Musculoskeletal Adexa without tenderness or enlargement.. Digits and nails w/o clubbing, cyanosis, infection, petechiae, ischemia, or inflammatory conditions.[name], [name] ([id number]) Psychiatric Judgement and insight Intact.. No evidence of depression, anxiety, or agitation.. General Notes: On dorsum of her right foot she has a superficial ulceration with some dry eschar at the base and no evidence of surrounding cellulitis. on the right lower lateral calf and the left lower lateral calf she has few small ulcerated areas possibly from injury due to her walker. Was started with moist saline gauze and no debridement required Integumentary (Hair, Skin) No suspicious lesions. No crepitus or fluctuance. No peri-wound warmth or erythema. No masses.. Wound #2 status is Open. Original cause of wound was Trauma. The wound is located on the Right,Dorsal Foot. The wound measures 0.5cm length x 1cm width x 0.2cm depth; 0.393cm^2 area and 0.079cm^3 volume. The wound is limited to skin breakdown. There is no tunneling or undermining noted. There is a medium amount of serous drainage noted. The wound margin is flat and intact. There is small (1-33%) red granulation within the wound bed. There is a medium (34-66%) amount of necrotic tissue  within the wound bed including Adherent Slough. The periwound skin appearance exhibited: Localized Edema, Ecchymosis. The periwound skin appearance did not exhibit: Callus, Crepitus, Excoriation, Fluctuance, Friable, Induration, Rash, Scarring, Dry/Scaly, Maceration, Moist, Atrophie Blanche, Cyanosis, Hemosiderin Staining, Mottled, Pallor, Rubor, Erythema. Wound #3 status is Open. Original cause of wound was Trauma. The wound is located on the Right Lower Leg. The wound measures 0.9cm length x 0.7cm width x 0.3cm depth; 0.495cm^2 area and 0.148cm^3 volume. The wound is limited to skin breakdown. There is no tunneling or undermining noted. There is a medium amount of serous drainage noted. The wound margin is flat and intact. There is no granulation within the wound bed. There is a large (67-100%) amount of necrotic tissue within the wound bed including Eschar and Adherent Slough. The periwound skin appearance exhibited: Localized Edema. The periwound skin appearance did not exhibit: Callus, Crepitus, Excoriation, Fluctuance, Friable, Induration, Rash, Scarring, Dry/Scaly, Maceration, Moist, Atrophie Blanche, Cyanosis, Ecchymosis, Hemosiderin Staining, Mottled, Pallor, Rubor, Erythema. Wound #4 status is Open. Original cause of wound was Trauma. The wound is located on the Left,Lateral Lower Leg. The wound measures 0.3cm length x 0.4cm width x 0.1cm depth; 0.094cm^2 area and 0.009cm^3 volume. The wound is limited to skin breakdown. There is no tunneling or undermining noted. There is a medium amount of serous drainage noted. The wound margin is flat and  intact. There is no granulation within the wound bed. There is a large (67-100%) amount of necrotic tissue within the wound bed including Adherent Slough. The periwound skin appearance exhibited: Localized Edema. The periwound skin appearance did not exhibit: Callus, Crepitus, Excoriation, Fluctuance, Friable, Induration, Rash, Scarring,  Dry/Scaly, Maceration, Moist, Atrophie Blanche, Cyanosis, Ecchymosis, Hemosiderin Staining, Mottled, Pallor, Rubor, Erythema. Assessment Active Problems ROCHELLA, BENNER ([id number]) ICD-10 G35 - Multiple sclerosis E66.01 - Morbid (severe) obesity due to excess calories L97.512 - Non-pressure chronic ulcer of other part of right foot with fat layer exposed L97.212 - Non-pressure chronic ulcer of right calf with fat layer exposed L97.222 - Non-pressure chronic ulcer of left calf with fat layer exposed F17.218 - Nicotine dependence, cigarettes, with other nicotine-induced disorders 65 year old patient who is a poor historian and has several psych issues, has nonhealing ulceration of both lower extremities for about 6 months. To review I have recommended: 1. Using Medihoney over the wound and applying a bordered foam over this. 2. washing her legs well with soap and water and making sure she does her dressing as advised 3. And 3 minutes discussing with her the need to completely give up smoking and I have recommended various options and discuss the benefits 4. Seeing her back for regular visits Plan Wound Cleansing: Wound #2 Right,Dorsal Foot: May Shower, gently pat wound dry prior to applying new dressing. Wound #3 Right Lower Leg: May Shower, gently pat wound dry prior to applying new dressing. Wound #4 Left,Lateral Lower Leg: May Shower, gently pat wound dry prior to applying new dressing. Anesthetic: Wound #2 Right,Dorsal Foot: Topical Lidocaine 4% cream applied to wound bed prior to debridement - in clinic only Wound #3 Right Lower Leg: Topical Lidocaine 4% cream applied to wound bed prior to debridement - in clinic only Wound #4 Left,Lateral Lower Leg: Topical Lidocaine 4% cream applied to wound bed prior to debridement - in clinic only Skin Barriers/Peri-Wound Care: Wound #2 Right,Dorsal Foot: Skin Prep Wound #3 Right Lower Leg: Skin Prep Wound #4 Left,Lateral Lower  Leg: Skin Prep Primary Wound Dressing: Wound #2 Right,Dorsal Foot: Medihoney gel Wound #3 Right Lower Leg: Medihoney gel Barrientez, Rebeccah H. ([id number]) Wound #4 Left,Lateral Lower Leg: Medihoney gel Secondary Dressing: Wound #2 Right,Dorsal Foot: Boardered Foam Dressing Wound #3 Right Lower Leg: Boardered Foam Dressing Wound #4 Left,Lateral Lower Leg: Boardered Foam Dressing Dressing Change Frequency: Wound #2 Right,Dorsal Foot: Change dressing every day. Wound #3 Right Lower Leg: Change dressing every day. Wound #4 Left,Lateral Lower Leg: Change dressing every day. Follow-up Appointments: Wound #2 Right,Dorsal Foot: Return Appointment in 1 week. Wound #3 Right Lower Leg: Return Appointment in 1 week. Wound #4 Left,Lateral Lower Leg: Return Appointment in 1 week. Additional Orders / Instructions: Wound #2 Right,Dorsal Foot: Stop Smoking Increase protein intake. Wound #3 Right Lower Leg: Stop Smoking Increase protein intake. Wound #4 Left,Lateral Lower Leg: Stop Smoking Increase protein intake. Medications-please add to medication list.: Wound #2 Right,Dorsal Foot: Other: - Medihoney/[place] Honey Wound #3 Right Lower Leg: Other: - Medihoney/[place] [facility] Wound #4 Left,Lateral Lower Leg: Other: - Medihoney/[place] Honey 65 year old patient who is a poor historian and has several psych issues, has nonhealing ulceration of both lower extremities for about 6 months. To review I have recommended: 1. Using Medihoney over the wound and applying a bordered foam over this. Mally, Keydi H. ([id number]) 2. washing her legs well with soap and water and making sure she does her dressing as advised 3. And 3 minutes discussing with her  the need to completely give up smoking and I have recommended various options and discuss the benefits 4. Seeing her back for regular visits Electronic Signature(s) Signed: 11/20/2015 2:08:33 PM By: Christin Fudge MD, FACS Entered By: Christin Fudge on  11/20/2015 14:08:33 [name] ([id number]) -------------------------------------------------------------------------------- ROS/PFSH Details Patient Name: [name] Date of Service: 11/20/2015 12:45 PM Medical Record Number: [id number] Patient Account Number: [id number] Date of Birth/Sex: 24-Feb-1951 (64 y.o. Female) Treating RN: Cornell Barman Primary Care Physician: LADA, [facility] Other Clinician: Referring Physician: LADA, MELINDA Treating Physician/Extender: Frann Rider in Treatment: 0 Information Obtained From Patient Wound History Do you currently have one or more open woundso Yes How many open wounds do you currently haveo 3 Approximately how long have you had your woundso 52mo How have you been treating your wound(s) until nowo neosporin Has your wound(s) ever healed and then re-openedo No Have you had any lab work done in the past montho Yes Who ordered the lab work doneo [facility]. LSteva ReadyHave you tested positive for an antibiotic resistant organism (MRSA, VRE)o No Have you tested positive for osteomyelitis (bone infection)o No Respiratory Complaints and Symptoms: No Complaints or Symptoms Complaints and Symptoms: Negative for: Chronic or frequent coughs; Shortness of Breath Medical History: Positive for: Chronic Obstructive Pulmonary Disease (COPD); Sleep Apnea Negative for: Aspiration; Asthma; Pneumothorax; Tuberculosis Integumentary (Skin) Complaints and Symptoms: Positive for: Wounds Negative for: Bleeding or bruising tendency; Breakdown; Swelling Medical History: Negative for: History of Burn; History of pressure wounds Musculoskeletal Complaints and Symptoms: No Complaints or Symptoms Complaints and Symptoms: Negative for: Muscle Pain; Muscle Weakness Moultrie, Jini H. ([id number] Medical History: Positive for: Osteoarthritis Negative for: Gout; Rheumatoid Arthritis Past Medical History Notes: MS Degenerative Bone  Disease Psychiatric Complaints and Symptoms: Positive for: Anxiety - depression Negative for: Claustrophobia Medical History: Negative for: Anorexia/bulimia; Confinement Anxiety Constitutional Symptoms (General Health) Complaints and Symptoms: No Complaints or Symptoms Eyes Complaints and Symptoms: No Complaints or Symptoms Medical History: Positive for: Cataracts Negative for: Glaucoma; Optic Neuritis Ear/Nose/Mouth/Throat Complaints and Symptoms: No Complaints or Symptoms Medical History: Negative for: Chronic sinus problems/congestion; Middle ear problems Hematologic/Lymphatic Complaints and Symptoms: No Complaints or Symptoms Medical History: Negative for: Anemia; Hemophilia; Human Immunodeficiency Virus; Lymphedema; Sickle Cell Disease Cardiovascular Complaints and Symptoms: No Complaints or Symptoms Medical History: WEDELMIRA, GALLOGLY([id number] Positive for: Hypertension Negative for: Angina; Arrhythmia; Congestive Heart Failure; Coronary Artery Disease; Deep Vein Thrombosis; Hypotension; Myocardial Infarction; Peripheral Arterial Disease; Peripheral Venous Disease; Phlebitis; Vasculitis Gastrointestinal Complaints and Symptoms: No Complaints or Symptoms Medical History: Negative for: Cirrhosis ; Colitis; Crohnos; Hepatitis A; Hepatitis B; Hepatitis C Endocrine Complaints and Symptoms: No Complaints or Symptoms Medical History: Negative for: Type I Diabetes; Type II Diabetes Genitourinary Complaints and Symptoms: No Complaints or Symptoms Medical History: Negative for: End Stage Renal Disease Immunological Complaints and Symptoms: No Complaints or Symptoms Medical History: Negative for: Lupus Erythematosus; Raynaudos Neurologic Complaints and Symptoms: No Complaints or Symptoms Medical History: Positive for: Neuropathy Negative for: Dementia; Quadriplegia; Paraplegia; Seizure Disorder Oncologic Complaints and Symptoms: No Complaints or  Symptoms Medical History: WZAIDA, REILAND([id number] Negative for: Received Chemotherapy; Received Radiation HBO Extended History Items Eyes: Cataracts Hospitalization / Surgery History Name of Hospital Purpose of Hospitalization/Surgery Date tonsillectomy pilonidal cyst surgery spinal decompression w/ steel screws Family and Social History Cancer: No; Diabetes: Yes - Mother; Heart Disease: No; Hereditary Spherocytosis: No; Hypertension: Yes - Mother, Father; Kidney Disease: No; Lung Disease: Yes - Mother; Seizures: No; Stroke: Yes - Maternal Grandparents;  Thyroid Problems: Yes - Siblings; Tuberculosis: No; Current every day smoker; Marital Status - Separated; Alcohol Use: Never; Drug Use: Current History - marijuana; Caffeine Use: Daily; Financial Concerns: Yes; Food, Clothing or Shelter Needs: No; Support System Lacking: Yes; Transportation Concerns: Yes; Advanced Directives: No; Patient does not want information on Advanced Directives; Living Will: No; Medical Power of Attorney: No Physician Affirmation I have reviewed and agree with the above information. Electronic Signature(s) Signed: 11/20/2015 3:03:12 PM By: Gretta Cool RN, BSN, Kim RN, BSN Signed: 11/20/2015 4:20:18 PM By: Christin Fudge MD, FACS Entered By: Christin Fudge on 11/20/2015 13:47:27 TANVI, GATLING ([id number]) -------------------------------------------------------------------------------- SuperBill Details Patient Name: [name] Date of Service: 11/20/2015 Medical Record Number: [id number] Patient Account Number: [id number] Date of Birth/Sex: [birth year] (65 y.o. 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 left calf with fat layer exposed F17.218 Nicotine dependence, cigarettes, with other nicotine-induced disorders Facility Procedures CPT4 Code Description: 78676720 99215 - WOUND CARE VISIT-LEV 5 EST PT Modifier: Quantity: 1 CPT4 Code Description: [id number] 99406-SMOKING CESSATION 3-10MINS ICD-10 Description Diagnosis G35 Multiple sclerosis L97.512 Non-pressure chronic ulcer of other part of right foo L97.212 Non-pressure chronic ulcer of right calf with fat lay L97.222  Non-pressure chronic ulcer of left calf with fat laye Modifier: t with fat la er exposed r exposed Quantity: 1 yer exposed Physician Procedures CPT4 Code Description: [id number] - WC PHYS LEVEL 4 - EST PT ICD-10 Description Diagnosis G35 Multiple sclerosis E66.01 Morbid (severe) obesity due to excess calories L97.512 Non-pressure chronic ulcer of other part of right fo L97.212 Non-pressure  chronic ulcer of right calf with fat la Modifier: ot with fat lay yer exposed Quantity: 1 er exposed CPT4 Code Description: Declo- SMOKING CESSATION 3-10 MINS ICD-10 Description Diagnosis THEODORA, LALANNE ([id number]) Modifier: Quantity: 1 Electronic Signature(s) Signed: 11/20/2015 2:09:42 PM By: Christin Fudge MD, FACS Previous Signature: 11/20/2015 2:09:10 PM Version By: Christin Fudge MD, FACS Entered By: Christin Fudge on 11/20/2015 14:09:41

## 2015-11-22 DIAGNOSIS — R339 Retention of urine, unspecified: Secondary | ICD-10-CM | POA: Diagnosis not present

## 2015-11-27 ENCOUNTER — Ambulatory Visit: Payer: Medicare Other | Admitting: Surgery

## 2015-11-30 ENCOUNTER — Encounter: Payer: Medicare Other | Attending: Surgery | Admitting: Surgery

## 2015-11-30 DIAGNOSIS — Z79899 Other long term (current) drug therapy: Secondary | ICD-10-CM | POA: Insufficient documentation

## 2015-11-30 DIAGNOSIS — F17218 Nicotine dependence, cigarettes, with other nicotine-induced disorders: Secondary | ICD-10-CM | POA: Diagnosis not present

## 2015-11-30 DIAGNOSIS — G35 Multiple sclerosis: Secondary | ICD-10-CM | POA: Diagnosis not present

## 2015-11-30 DIAGNOSIS — F419 Anxiety disorder, unspecified: Secondary | ICD-10-CM | POA: Diagnosis not present

## 2015-11-30 DIAGNOSIS — I10 Essential (primary) hypertension: Secondary | ICD-10-CM | POA: Insufficient documentation

## 2015-11-30 DIAGNOSIS — G4733 Obstructive sleep apnea (adult) (pediatric): Secondary | ICD-10-CM | POA: Insufficient documentation

## 2015-11-30 DIAGNOSIS — I739 Peripheral vascular disease, unspecified: Secondary | ICD-10-CM | POA: Diagnosis not present

## 2015-11-30 DIAGNOSIS — L97222 Non-pressure chronic ulcer of left calf with fat layer exposed: Secondary | ICD-10-CM | POA: Insufficient documentation

## 2015-11-30 DIAGNOSIS — Z6841 Body Mass Index (BMI) 40.0 and over, adult: Secondary | ICD-10-CM | POA: Insufficient documentation

## 2015-11-30 DIAGNOSIS — L97512 Non-pressure chronic ulcer of other part of right foot with fat layer exposed: Secondary | ICD-10-CM | POA: Insufficient documentation

## 2015-11-30 DIAGNOSIS — J449 Chronic obstructive pulmonary disease, unspecified: Secondary | ICD-10-CM | POA: Diagnosis not present

## 2015-11-30 DIAGNOSIS — S91301A Unspecified open wound, right foot, initial encounter: Secondary | ICD-10-CM | POA: Diagnosis not present

## 2015-11-30 DIAGNOSIS — S81801A Unspecified open wound, right lower leg, initial encounter: Secondary | ICD-10-CM | POA: Diagnosis not present

## 2015-11-30 DIAGNOSIS — S81802A Unspecified open wound, left lower leg, initial encounter: Secondary | ICD-10-CM | POA: Diagnosis not present

## 2015-11-30 DIAGNOSIS — L97212 Non-pressure chronic ulcer of right calf with fat layer exposed: Secondary | ICD-10-CM | POA: Insufficient documentation

## 2015-11-30 DIAGNOSIS — R32 Unspecified urinary incontinence: Secondary | ICD-10-CM | POA: Diagnosis not present

## 2015-12-01 NOTE — Progress Notes (Signed)
[name] ([id number]) Visit Report for 11/30/2015 Arrival Information Details Patient Name: [name] Date of Service: 11/30/2015 2:15 PM Medical Record Number: [id number] Patient Account Number: [id number] Date of Birth/Sex: [birth year] (65 y.o. Female) Treating RN: Montey Hora Primary Care Physician: LADA, [place] Other Clinician: Referring Physician: LADA, [place] Treating Physician/Extender: Frann Rider in Treatment: 1 Visit Information History Since Last Visit Added or deleted any medications: No Patient Arrived: [place] Any new allergies or adverse reactions: No Arrival Time: 14:31 Had a fall or experienced change in No Accompanied By: self activities of daily living that may affect Transfer Assistance: None risk of falls: Patient Identification Verified: Yes Signs or symptoms of abuse/neglect since last No Secondary Verification Process Completed: Yes visito Patient Requires Transmission-Based No Hospitalized since last visit: No Precautions: Pain Present Now: No Patient Has Alerts: Yes Electronic Signature(s) Signed: 11/30/2015 4:49:27 PM By: Montey Hora Entered By: Montey Hora on 11/30/2015 14:31:22 [name] ([id number]) -------------------------------------------------------------------------------- Encounter Discharge Information Details Patient Name: [name] Date of Service: 11/30/2015 2:15 PM Medical Record Number: [id number] Patient Account Number: [id number] Date of Birth/Sex: Jun 24, 1950 (64 y.o. Female) Treating RN: Montey Hora Primary Care Physician: LADA, [place] Other Clinician: Referring Physician: LADA, [place] Treating Physician/Extender: Frann Rider in Treatment: 1 Encounter Discharge Information Items Discharge Pain Level: 0 Discharge Condition: Stable Ambulatory Status: [place] Discharge Destination: Home Transportation: Private Auto Accompanied By: self Schedule Follow-up Appointment: Yes Medication  Reconciliation completed No and provided to Patient/Care Christian Treadway: Provided on Clinical Summary of Care: 11/30/2015 Form Type Recipient Paper Patient BW Electronic Signature(s) Signed: 11/30/2015 3:07:40 PM By: Ruthine Dose Entered By: Ruthine Dose on 11/30/2015 15:07:40 [name] ([id number]) -------------------------------------------------------------------------------- Lower Extremity Assessment Details Patient Name: [name] Date of Service: 11/30/2015 2:15 PM Medical Record Number: [id number] Patient Account Number: [id number] Date of Birth/Sex: 17-May-1950 (64 y.o. Female) Treating RN: Montey Hora Primary Care Physician: LADA, [place] Other Clinician: Referring Physician: LADA, [place] Treating Physician/Extender: Frann Rider in Treatment: 1 Vascular Assessment Pulses: Posterior Tibial Dorsalis Pedis Palpable: [Left:Yes] [Right:Yes] Extremity colors, hair growth, and conditions: Extremity Color: [Left:Normal] [Right:Normal] Hair Growth on Extremity: [Left:No] [Right:No] Temperature of Extremity: [Left:Cool] [Right:Cool] Capillary Refill: [Left:< 3 seconds] [Right:< 3 seconds] Electronic Signature(s) Signed: 11/30/2015 4:49:27 PM By: Montey Hora Entered By: Montey Hora on 11/30/2015 14:39:31 [name] ([id number]) -------------------------------------------------------------------------------- Multi Wound Chart Details Patient Name: [name] Date of Service: 11/30/2015 2:15 PM Medical Record Number: [id number] Patient Account Number: [id number] Date of Birth/Sex: January 01, 1951 (64 y.o. Female) Treating RN: Afful, RN, BSN, Velva Harman Primary Care Physician: LADA, [place] Other Clinician: Referring Physician: LADA, MELINDA Treating Physician/Extender: Frann Rider in Treatment: 1 Vital Signs Height(in): 63 Pulse(bpm): 78 Weight(lbs): 270 Blood Pressure 132/72 (mmHg): Body Mass Index(BMI): 48 Temperature(F): 98.0 Respiratory  Rate 20 (breaths/min): Photos: Wound Location: Right Foot - Dorsal Right Lower Leg Left Lower Leg - Lateral Wounding Event: Trauma Trauma Trauma Primary Etiology: Trauma, Other Trauma, Other Venous Leg Ulcer Comorbid History: Cataracts, Chronic Cataracts, Chronic Cataracts, Chronic Obstructive Pulmonary Obstructive Pulmonary Obstructive Pulmonary Disease (COPD), Sleep Disease (COPD), Sleep Disease (COPD), Sleep Apnea, Hypertension, Apnea, Hypertension, Apnea, Hypertension, Osteoarthritis, Neuropathy Osteoarthritis, Neuropathy Osteoarthritis, Neuropathy Date Acquired: 07/21/2015 10/09/2015 10/09/2015 Weeks of Treatment: 1 1 1  Wound Status: Open Open Open Measurements L x W x D 0.5x1.2x0.2 0.9x0.8x0.3 0.4x0.4x0.1 (cm) Area (cm) : 0.471 0.565 0.126 Volume (cm) : 0.094 0.17 0.013 % Reduction in Area: -19.80% -14.10% -34.00% % Reduction in Volume: -19.00% -14.90% -44.40% Classification: Partial Thickness  Partial Thickness Partial Thickness Exudate Amount: Large Large Large Exudate Type: Serous Serous Serous Exudate Color: amber amber amber Wound Margin: Flat and Intact Flat and Intact Flat and Intact Granulation Amount: Small (1-33%) Small (1-33%) None Present (0%) [name], [name] ([id number]) Granulation Quality: Red Pink N/A Necrotic Amount: Medium (34-66%) Large (67-100%) Large (67-100%) Necrotic Tissue: Adherent Slough Eschar, [place] Exposed Structures: Fascia: No Fascia: No Fascia: No Fat: No Fat: No Fat: No Tendon: No Tendon: No Tendon: No Muscle: No Muscle: No Muscle: No Joint: No Joint: No Joint: No Bone: No Bone: No Bone: No Limited to Skin Limited to Skin Limited to Skin Breakdown Breakdown Breakdown Epithelialization: None None None Periwound Skin Texture: Edema: Yes Edema: Yes Edema: Yes Excoriation: No Excoriation: No Excoriation: No Induration: No Induration: No Induration: No Callus: No Callus: No Callus: No Crepitus:  No Crepitus: No Crepitus: No Fluctuance: No Fluctuance: No Fluctuance: No Friable: No Friable: No Friable: No Rash: No Rash: No Rash: No Scarring: No Scarring: No Scarring: No Periwound Skin Maceration: No Maceration: No Maceration: No Moisture: Moist: No Moist: No Moist: No Dry/Scaly: No Dry/Scaly: No Dry/Scaly: No Periwound Skin Color: Ecchymosis: Yes Atrophie Blanche: No Atrophie Blanche: No Atrophie Blanche: No Cyanosis: No Cyanosis: No Cyanosis: No Ecchymosis: No Ecchymosis: No Erythema: No Erythema: No Erythema: No Hemosiderin Staining: No Hemosiderin Staining: No Hemosiderin Staining: No Mottled: No Mottled: No Mottled: No Pallor: No Pallor: No Pallor: No Rubor: No Rubor: No Rubor: No Tenderness on No No No Palpation: Wound Preparation: Ulcer Cleansing: Ulcer Cleansing: Ulcer Cleansing: Rinsed/Irrigated with Rinsed/Irrigated with Rinsed/Irrigated with Saline Saline Saline Topical Anesthetic Topical Anesthetic Topical Anesthetic Applied: Other: lidocaine Applied: Other: lidocaine Applied: Other: lidocaine 4% 4% 4% Treatment Notes Electronic Signature(s) Signed: 11/30/2015 4:00:53 PM By: Regan Lemming BSN, RN Entered By: Regan Lemming on 11/30/2015 14:54:04 [name] ([id number]) -------------------------------------------------------------------------------- Multi-Disciplinary Care Plan Details Patient Name: [name] Date of Service: 11/30/2015 2:15 PM Medical Record Number: [id number] Patient Account Number: [id number] Date of Birth/Sex: 29-Jun-1950 (64 y.o. Female) Treating RN: Afful, RN, BSN, Velva Harman Primary Care Physician: LADA, [place] Other Clinician: Referring Physician: LADA, [place] Treating Physician/Extender: Frann Rider in Treatment: 1 Active Inactive Abuse / Safety / Falls / Self Care Management Nursing Diagnoses: Impaired physical mobility Potential for falls Goals: Patient will remain injury free Date  Initiated: 11/20/2015 Goal Status: Active Interventions: Assess fall risk on admission and as needed Notes: Nutrition Nursing Diagnoses: Imbalanced nutrition Goals: Patient/caregiver agrees to and verbalizes understanding of need to use nutritional supplements and/or vitamins as prescribed Date Initiated: 11/20/2015 Goal Status: Active Interventions: Provide education on nutrition Notes: Orientation to the Wound Care Program Nursing Diagnoses: Knowledge deficit related to the wound healing center program Goals: RENESME, FILBECK ([id number]) Patient/caregiver will verbalize understanding of the [facility] Date Initiated: 11/20/2015 Goal Status: Active Interventions: Provide education on orientation to the wound center Notes: Wound/Skin Impairment Nursing Diagnoses: Impaired tissue integrity Goals: Patient will demonstrate a reduced rate of smoking or cessation of smoking Date Initiated: 11/20/2015 Goal Status: Active Ulcer/skin breakdown will heal within 14 weeks Date Initiated: 11/20/2015 Goal Status: Active Interventions: Assess patient/caregiver ability to obtain necessary supplies Provide education on smoking Notes: Electronic Signature(s) Signed: 11/30/2015 4:00:53 PM By: Regan Lemming BSN, RN Entered By: Regan Lemming on 11/30/2015 14:53:35 [name] ([id number]) -------------------------------------------------------------------------------- Pain Assessment Details Patient Name: [name] Date of Service: 11/30/2015 2:15 PM Medical Record Number: [id number] Patient Account Number: [id number] Date of Birth/Sex:  [birth year] (65 y.o. Female) Treating RN: Montey Hora Primary Care Physician: LADA, [place] Other Clinician: Referring Physician: LADA, MELINDA Treating Physician/Extender: Frann Rider in Treatment: 1 Active Problems Location of Pain Severity and Description of Pain Patient Has Paino No Site Locations Pain Management and  Medication Current Pain Management: Notes Topical or injectable lidocaine is offered to patient for acute pain when surgical debridement is performed. If needed, Patient is instructed to use over the counter pain medication for the following 24-48 hours after debridement. Wound care MDs do not prescribed pain medications. Patient has chronic pain or uncontrolled pain. Patient has been instructed to make an appointment with their Primary Care Physician for pain management. Electronic Signature(s) Signed: 11/30/2015 4:49:27 PM By: Montey Hora Entered By: Montey Hora on 11/30/2015 14:31:30 [name] ([id number]) -------------------------------------------------------------------------------- Patient/Caregiver Education Details Patient Name: [name] Date of Service: 11/30/2015 2:15 PM Medical Record Number: [id number] Patient Account Number: [id number] Date of Birth/Gender: Sep 16, 1950 (64 y.o. Female) Treating RN: Montey Hora Primary Care Physician: LADA, [place] Other Clinician: Referring Physician: LADA, [place] Treating Physician/Extender: Frann Rider in Treatment: 1 Education Assessment Education Provided To: Patient Education Topics Provided Wound/Skin Impairment: Handouts: Other: daily dressing change Methods: Explain/Verbal Responses: State content correctly Electronic Signature(s) Signed: 11/30/2015 4:49:27 PM By: Montey Hora Entered By: Montey Hora on 11/30/2015 15:05:43 [name] ([id number]) -------------------------------------------------------------------------------- Wound Assessment Details Patient Name: [name] Date of Service: 11/30/2015 2:15 PM Medical Record Number: [id number] Patient Account Number: [id number] Date of Birth/Sex: 07/23/1950 (64 y.o. Female) Treating RN: Montey Hora Primary Care Physician: LADA, [place] Other Clinician: Referring Physician: LADA, MELINDA Treating Physician/Extender: Frann Rider in Treatment: 1 Wound Status Wound Number: 2 Primary Trauma, Other Etiology: Wound Location: Right Foot - Dorsal Wound Open Wounding Event: Trauma Status: Date Acquired: 07/21/2015 Comorbid Cataracts, Chronic Obstructive Weeks Of Treatment: 1 History: Pulmonary Disease (COPD), Sleep Clustered Wound: No Apnea, Hypertension, Osteoarthritis, Neuropathy Photos Wound Measurements Length: (cm) 0.5 Width: (cm) 1.2 Depth: (cm) 0.2 Area: (cm) 0.471 Volume: (cm) 0.094 % Reduction in Area: -19.8% % Reduction in Volume: -19% Epithelialization: None Tunneling: No Undermining: No Wound Description Classification: Partial Thickness Wound Margin: Flat and Intact Exudate Amount: Large Exudate Type: Serous Exudate Color: amber Wound Bed Granulation Amount: Small (1-33%) Exposed Structure Granulation Quality: Red Fascia Exposed: No Necrotic Amount: Medium (34-66%) Fat Layer Exposed: No Necrotic Quality: Adherent Slough Tendon Exposed: No Mcanany, Floraine H. ([id number]) Muscle Exposed: No Joint Exposed: No Bone Exposed: No Limited to Skin Breakdown Periwound Skin Texture Texture Color No Abnormalities Noted: No No Abnormalities Noted: No Callus: No Atrophie Blanche: No Crepitus: No Cyanosis: No Excoriation: No Ecchymosis: Yes Fluctuance: No Erythema: No Friable: No Hemosiderin Staining: No Induration: No Mottled: No Localized Edema: Yes Pallor: No Rash: No Rubor: No Scarring: No Moisture No Abnormalities Noted: No Dry / Scaly: No Maceration: No Moist: No Wound Preparation Ulcer Cleansing: Rinsed/Irrigated with Saline Topical Anesthetic Applied: Other: lidocaine 4%, Treatment Notes Wound #2 (Right, Dorsal Foot) 1. Cleansed with: Clean wound with Normal Saline 2. Anesthetic Topical Lidocaine 4% cream to wound bed prior to debridement 3. Peri-wound Care: Skin Prep 4. Dressing Applied: Medihoney Gel 5. Secondary Dressing Applied Bordered Foam  Dressing Electronic Signature(s) Signed: 11/30/2015 4:49:27 PM By: Montey Hora Entered By: Montey Hora on 11/30/2015 14:44:20 [name] ([id number]) -------------------------------------------------------------------------------- Wound Assessment Details Patient Name: [name] Date of Service: 11/30/2015 2:15 PM Medical Record Number: [id number] Patient Account Number: [id number] Date of Birth/Sex: 1951-01-05 (64 y.o.  Female) Treating RN: Montey Hora Primary Care Physician: LADA, [place] Other Clinician: Referring Physician: LADA, MELINDA Treating Physician/Extender: Frann Rider in Treatment: 1 Wound Status Wound Number: 3 Primary Trauma, Other Etiology: Wound Location: Right Lower Leg Wound Open Wounding Event: Trauma Status: Date Acquired: 10/09/2015 Comorbid Cataracts, Chronic Obstructive Weeks Of Treatment: 1 History: Pulmonary Disease (COPD), Sleep Clustered Wound: No Apnea, Hypertension, Osteoarthritis, Neuropathy Photos Wound Measurements Length: (cm) 0.9 Width: (cm) 0.8 Depth: (cm) 0.3 Area: (cm) 0.565 Volume: (cm) 0.17 % Reduction in Area: -14.1% % Reduction in Volume: -14.9% Epithelialization: None Tunneling: No Undermining: No Wound Description Classification: Partial Thickness Wound Margin: Flat and Intact Exudate Amount: Large Exudate Type: Serous Exudate Color: amber Wound Bed Granulation Amount: Small (1-33%) Exposed Structure Granulation Quality: Pink Fascia Exposed: No Necrotic Amount: Large (67-100%) Fat Layer Exposed: No Necrotic Quality: Eschar, Adherent Slough Tendon Exposed: No Fye, Jackqueline H. ([id number]) Muscle Exposed: No Joint Exposed: No Bone Exposed: No Limited to Skin Breakdown Periwound Skin Texture Texture Color No Abnormalities Noted: No No Abnormalities Noted: No Callus: No Atrophie Blanche: No Crepitus: No Cyanosis: No Excoriation: No Ecchymosis: No Fluctuance: No Erythema: No Friable:  No Hemosiderin Staining: No Induration: No Mottled: No Localized Edema: Yes Pallor: No Rash: No Rubor: No Scarring: No Moisture No Abnormalities Noted: No Dry / Scaly: No Maceration: No Moist: No Wound Preparation Ulcer Cleansing: Rinsed/Irrigated with Saline Topical Anesthetic Applied: Other: lidocaine 4%, Treatment Notes Wound #3 (Right Lower Leg) 1. Cleansed with: Clean wound with Normal Saline 2. Anesthetic Topical Lidocaine 4% cream to wound bed prior to debridement 3. Peri-wound Care: Skin Prep 4. Dressing Applied: Medihoney Gel 5. Secondary Dressing Applied Bordered Foam Dressing Electronic Signature(s) Signed: 11/30/2015 4:49:27 PM By: Montey Hora Entered By: Montey Hora on 11/30/2015 14:44:49 [name] ([id number]) -------------------------------------------------------------------------------- Wound Assessment Details Patient Name: [name] Date of Service: 11/30/2015 2:15 PM Medical Record Number: [id number] Patient Account Number: [id number] Date of Birth/Sex: Sep 22, 1950 (64 y.o. Female) Treating RN: Montey Hora Primary Care Physician: LADA, [place] Other Clinician: Referring Physician: LADA, MELINDA Treating Physician/Extender: Frann Rider in Treatment: 1 Wound Status Wound Number: 4 Primary Venous Leg Ulcer Etiology: Wound Location: Left Lower Leg - Lateral Wound Open Wounding Event: Trauma Status: Date Acquired: 10/09/2015 Comorbid Cataracts, Chronic Obstructive Weeks Of Treatment: 1 History: Pulmonary Disease (COPD), Sleep Clustered Wound: No Apnea, Hypertension, Osteoarthritis, Neuropathy Photos Wound Measurements Length: (cm) 0.4 Width: (cm) 0.4 Depth: (cm) 0.1 Area: (cm) 0.126 Volume: (cm) 0.013 % Reduction in Area: -34% % Reduction in Volume: -44.4% Epithelialization: None Tunneling: No Undermining: No Wound Description Classification: Partial Thickness Wound Margin: Flat and Intact Exudate Amount:  Large Exudate Type: Serous Exudate Color: amber Wound Bed Granulation Amount: None Present (0%) Exposed Structure Necrotic Amount: Large (67-100%) Fascia Exposed: No Necrotic Quality: Adherent Slough Fat Layer Exposed: No Tendon Exposed: No Goodnough, Carrisa H. ([id number]) Muscle Exposed: No Joint Exposed: No Bone Exposed: No Limited to Skin Breakdown Periwound Skin Texture Texture Color No Abnormalities Noted: No No Abnormalities Noted: No Callus: No Atrophie Blanche: No Crepitus: No Cyanosis: No Excoriation: No Ecchymosis: No Fluctuance: No Erythema: No Friable: No Hemosiderin Staining: No Induration: No Mottled: No Localized Edema: Yes Pallor: No Rash: No Rubor: No Scarring: No Moisture No Abnormalities Noted: No Dry / Scaly: No Maceration: No Moist: No Wound Preparation Ulcer Cleansing: Rinsed/Irrigated with Saline Topical Anesthetic Applied: Other: lidocaine 4%, Treatment Notes Wound #4 (Left, Lateral Lower Leg) 1. Cleansed with: Clean wound with Normal Saline 2. Anesthetic Topical  Lidocaine 4% cream to wound bed prior to debridement 3. Peri-wound Care: Skin Prep 4. Dressing Applied: Medihoney Gel 5. Secondary Dressing Applied Bordered Foam Dressing Electronic Signature(s) Signed: 11/30/2015 4:49:27 PM By: Montey Hora Entered By: Montey Hora on 11/30/2015 14:45:17 [name] ([id number]) -------------------------------------------------------------------------------- [place] Details Patient Name: [name] Date of Service: 11/30/2015 2:15 PM Medical Record Number: [id number] Patient Account Number: [id number] Date of Birth/Sex: 1950-09-28 (64 y.o. Female) Treating RN: Montey Hora Primary Care Physician: LADA, [place] Other Clinician: Referring Physician: LADA, [place] Treating Physician/Extender: Frann Rider in Treatment: 1 Vital Signs Time Taken: 14:32 Temperature (F): 98.0 Height (in): 63 Pulse (bpm): 78 Weight  (lbs): 270 Respiratory Rate (breaths/min): 20 Body Mass Index (BMI): 47.8 Blood Pressure (mmHg): 132/72 Reference Range: 80 - 120 mg / dl Electronic Signature(s) Signed: 11/30/2015 4:49:27 PM By: Montey Hora Entered By: Montey Hora on 11/30/2015 14:33:16

## 2015-12-01 NOTE — Progress Notes (Signed)
[name] ([id number]) Visit Report for 11/30/2015 Chief Complaint Document Details Patient Name: [name] Date of Service: 11/30/2015 2:15 PM Medical Record Number: [id number] Patient Account Number: [id number] Date of Birth/Sex: 03-24-1951 (65 y.o. Female) Treating RN: Montey Hora Primary Care Physician: LADA, [place] Other Clinician: Referring Physician: LADA, [place] Treating Physician/Extender: Frann Rider in Treatment: 1 Information Obtained from: Patient Chief Complaint Patient seen for complaints of Non-Healing Wound to the left and right lower extremity and the right dorsum of the foot for about 6 months now Electronic Signature(s) Signed: 11/30/2015 2:59:21 PM By: Christin Fudge MD, FACS Entered By: Christin Fudge on 11/30/2015 14:59:20 [name] ([id number]) -------------------------------------------------------------------------------- Debridement Details Patient Name: [name] Date of Service: 11/30/2015 2:15 PM Medical Record Number: [id number] Patient Account Number: [id number] Date of Birth/Sex: Sep 15, 1950 (65 y.o. Female) Treating RN: Montey Hora Primary Care Physician: LADA, [place] Other Clinician: Referring Physician: LADA, [place] Treating Physician/Extender: Frann Rider in Treatment: 1 Debridement Performed for Wound #2 Right,Dorsal Foot Assessment: Performed By: Physician Christin Fudge, MD Debridement: Open Wound/Selective Debridement Selective Description: Pre-procedure Yes Verification/Time Out Taken: Start Time: 14:55 Pain Control: Lidocaine 4% Topical Solution Level: Non-Viable Tissue Total Area Debrided (L x 0.5 (cm) x 1.2 (cm) = 0.6 (cm) W): Tissue and other Non-Viable, Fibrin/Slough, Subcutaneous material debrided: Instrument: Curette Bleeding: Minimum Hemostasis Achieved: Pressure End Time: 14:57 Procedural Pain: 0 Post Procedural Pain: 0 Response to Treatment: Procedure was tolerated well Post  Debridement Measurements of Total Wound Length: (cm) 0.5 Width: (cm) 1.2 Depth: (cm) 0.2 Volume: (cm) 0.094 Post Procedure Diagnosis Same as Pre-procedure Electronic Signature(s) Signed: 11/30/2015 2:59:04 PM By: Christin Fudge MD, FACS Signed: 11/30/2015 4:49:27 PM By: Montey Hora Entered By: Christin Fudge on 11/30/2015 14:59:04 [name] ([id number]) -------------------------------------------------------------------------------- Debridement Details Patient Name: [name] Date of Service: 11/30/2015 2:15 PM Medical Record Number: [id number] Patient Account Number: [id number] Date of Birth/Sex: 08-Dec-1950 (65 y.o. Female) Treating RN: Montey Hora Primary Care Physician: LADA, [place] Other Clinician: Referring Physician: LADA, [place] Treating Physician/Extender: Frann Rider in Treatment: 1 Debridement Performed for Wound #3 Right Lower Leg Assessment: Performed By: Physician Christin Fudge, MD Debridement: Open Wound/Selective Debridement Selective Description: Pre-procedure Yes Verification/Time Out Taken: Start Time: 14:53 Pain Control: Lidocaine 4% Topical Solution Level: Non-Viable Tissue Total Area Debrided (L x 0.9 (cm) x 0.8 (cm) = 0.72 (cm) W): Tissue and other Non-Viable, Fibrin/Slough, Subcutaneous material debrided: Instrument: Curette Bleeding: Minimum Hemostasis Achieved: Pressure End Time: 14:55 Procedural Pain: 0 Post Procedural Pain: 0 Response to Treatment: Procedure was tolerated well Post Debridement Measurements of Total Wound Length: (cm) 0.9 Width: (cm) 0.8 Depth: (cm) 0.3 Volume: (cm) 0.17 Post Procedure Diagnosis Same as Pre-procedure Electronic Signature(s) Signed: 11/30/2015 2:59:12 PM By: Christin Fudge MD, FACS Signed: 11/30/2015 4:49:27 PM By: Montey Hora Entered By: Christin Fudge on 11/30/2015 14:59:12 [name]  ([id number]) -------------------------------------------------------------------------------- HPI Details Patient Name: [name] Date of Service: 11/30/2015 2:15 PM Medical Record Number: [id number] Patient Account Number: [id number] Date of Birth/Sex: 07-23-1950 (65 y.o. Female) Treating RN: Montey Hora Primary Care Physician: LADA, [place] Other Clinician: Referring Physician: LADA, MELINDA Treating Physician/Extender: Frann Rider in Treatment: 1 History of Present Illness Location: left and right lower extremity and dorsum of the right foot Quality: Patient reports experiencing a sharp pain to affected area(s). Severity: Patient states wound are getting worse. Duration: Patient has had the wound for > 6 months prior to seeking treatment at the wound center Timing: Pain in  wound is Intermittent (comes and goes Context: The wound appeared gradually over time Modifying Factors: Other treatment(s) tried include: symptomatic treatment as advised by her PCP Associated Signs and Symptoms: Patient reports having increase swelling. HPI Description: 65 year old with a past medical history significant for MS, urinary incontinence, and obesity. She has been seen in the wound clinic before for lower extremity ulcerations treated with compression therapy. she is also known to have hypertension, peripheral vascular disease, COPD, obstructive sleep apnea, lumbar radiculopathy, kyphoscoliosis, urinary issues and tobacco abuse. [place] a packet of cigarettes a day was recently seen at the [facility] for swelling of her legs and feet with a ulceration on the dorsum of the right foot which has been there for about 6 months. she was recently in the ER about a month ago where she was seen for shortness of breath and swelling of the legs and a chest x-ray was within normal limits had an increase in her BNP and was given Lasix and put on doxycycline for a mild cellulitis and  possible UTI. Electronic Signature(s) Signed: 11/30/2015 2:59:30 PM By: Christin Fudge MD, FACS Entered By: Christin Fudge on 11/30/2015 14:59:30 [name] ([id number]) -------------------------------------------------------------------------------- Physical Exam Details Patient Name: [name] Date of Service: 11/30/2015 2:15 PM Medical Record Number: [id number] Patient Account Number: [id number] Date of Birth/Sex: 07/10/1950 (64 y.o. Female) Treating RN: Montey Hora Primary Care Physician: LADA, [place] Other Clinician: Referring Physician: LADA, MELINDA Treating Physician/Extender: Frann Rider in Treatment: 1 Constitutional . Pulse regular. Respirations normal and unlabored. Afebrile. . Eyes Nonicteric. Reactive to light. Ears, Nose, Mouth, and Throat Lips, teeth, and gums WNL.Marland Kitchen Moist mucosa without lesions. Neck supple and nontender. No palpable supraclavicular or cervical adenopathy. Normal sized without goiter. Respiratory WNL. No retractions.. Cardiovascular Pedal Pulses WNL. No clubbing, cyanosis or edema. Lymphatic No adneopathy. No adenopathy. No adenopathy. Musculoskeletal Adexa without tenderness or enlargement.. Digits and nails w/o clubbing, cyanosis, infection, petechiae, ischemia, or inflammatory conditions.. Integumentary (Hair, Skin) No suspicious lesions. No crepitus or fluctuance. No peri-wound warmth or erythema. No masses.Marland Kitchen Psychiatric Judgement and insight Intact.. No evidence of depression, anxiety, or agitation.. Notes left leg looks fairly clean but the right leg required debridement both on the dorsum and the right lateral leg. Done with a #3 curet and bleeding controlled with pressure Electronic Signature(s) Signed: 11/30/2015 3:00:18 PM By: Christin Fudge MD, FACS Entered By: Christin Fudge on 11/30/2015 15:00:17 [name] ([id number]) -------------------------------------------------------------------------------- Physician  Orders Details Patient Name: [name] Date of Service: 11/30/2015 2:15 PM Medical Record Number: [id number] Patient Account Number: [id number] Date of Birth/Sex: 01-06-1951 (64 y.o. Female) Treating RN: Afful, RN, BSN, Velva Harman Primary Care Physician: LADA, [place] Other Clinician: Referring Physician: LADA, MELINDA Treating Physician/Extender: Frann Rider in Treatment: 1 Verbal / Phone Orders: Yes Clinician: Afful, RN, BSN, Rita Read Back and Verified: Yes Diagnosis Coding Wound Cleansing Wound #2 Right,Dorsal Foot o May Shower, gently pat wound dry prior to applying new dressing. Wound #3 Right Lower Leg o May Shower, gently pat wound dry prior to applying new dressing. Wound #4 Left,Lateral Lower Leg o May Shower, gently pat wound dry prior to applying new dressing. Anesthetic Wound #2 Right,Dorsal Foot o Topical Lidocaine 4% cream applied to wound bed prior to debridement - in clinic only Wound #3 Right Lower Leg o Topical Lidocaine 4% cream applied to wound bed prior to debridement - in clinic only Wound #4 Left,Lateral Lower Leg o Topical Lidocaine 4% cream applied to wound  bed prior to debridement - in clinic only Skin Barriers/Peri-Wound Care Wound #2 Right,Dorsal Foot o Skin Prep Wound #3 Right Lower Leg o Skin Prep Wound #4 Left,Lateral Lower Leg o Skin Prep Primary Wound Dressing Wound #2 Right,Dorsal Foot o Medihoney gel Wound #3 Right Lower Leg o Medihoney gel Bachand, Bronte H. ([id number]) Wound #4 Left,Lateral Lower Leg o Medihoney gel Secondary Dressing Wound #2 Right,Dorsal Foot o Boardered Foam Dressing Wound #3 Right Lower Leg o Boardered Foam Dressing Wound #4 Left,Lateral Lower Leg o Boardered Foam Dressing Dressing Change Frequency Wound #2 Right,Dorsal Foot o Change dressing every day. Wound #3 Right Lower Leg o Change dressing every day. Wound #4 Left,Lateral Lower Leg o Change dressing every  day. Follow-up Appointments Wound #2 Right,Dorsal Foot o Return Appointment in 1 week. Wound #3 Right Lower Leg o Return Appointment in 1 week. Wound #4 Left,Lateral Lower Leg o Return Appointment in 1 week. Additional Orders / Instructions Wound #2 Right,Dorsal Foot o Stop Smoking o Increase protein intake. Wound #3 Right Lower Leg o Stop Smoking o Increase protein intake. Wound #4 Left,Lateral Lower Leg o Stop Smoking o Increase protein intake. DAPHNIE, KATCHER ([id number]) Medications-please add to medication list. Wound #2 Right,Dorsal Foot o Other: - Medihoney/Manuka Honey Wound #3 Right Lower Leg o Other: - Medihoney/Manuka Honey Wound #4 Left,Lateral Lower Leg o Other: - Medihoney/Manuka Honey Electronic Signature(s) Signed: 11/30/2015 4:00:53 PM By: Regan Lemming BSN, RN Signed: 11/30/2015 4:34:24 PM By: Christin Fudge MD, FACS Entered By: Regan Lemming on 11/30/2015 14:57:19 [name] ([id number]) -------------------------------------------------------------------------------- Problem List Details Patient Name: [name] Date of Service: 11/30/2015 2:15 PM Medical Record Number: [id number] Patient Account Number: [id number] Date of Birth/Sex: Jan 08, 1951 (64 y.o. 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 11/20/2015 Yes exposed L97.222 Non-pressure chronic ulcer of left calf with fat layer 11/20/2015 Yes exposed F17.218 Nicotine dependence, cigarettes, with  other nicotine- 11/20/2015 Yes induced disorders Inactive Problems Resolved Problems Electronic Signature(s) Signed: 11/30/2015 2:58:55 PM By: Christin Fudge MD, FACS Entered By: Christin Fudge on 11/30/2015 14:58:55 [name] ([id number]) -------------------------------------------------------------------------------- Progress Note Details Patient Name: [name] Date of Service: 11/30/2015 2:15 PM Medical Record Number: [id number] Patient Account Number: [id number] Date of Birth/Sex: 07-Jan-1951 (64 y.o. Female) Treating RN: Montey Hora Primary Care Physician: LADA, [place] Other Clinician: Referring Physician: LADA, MELINDA Treating Physician/Extender: Frann Rider in Treatment: 1 Subjective Chief Complaint Information obtained from Patient Patient seen for complaints of Non-Healing Wound to the left and right lower extremity and the right dorsum of the foot for about 6 months now History of Present Illness (HPI) The following HPI elements were documented for the patient's wound: Location: left and right lower extremity and dorsum of the right foot Quality: Patient reports experiencing a sharp pain to affected area(s). Severity: Patient states wound are getting worse. Duration: Patient has had the wound for > 6 months prior to seeking treatment at the wound center Timing: Pain in wound is Intermittent (comes and goes Context: The wound appeared gradually over time Modifying Factors: Other treatment(s) tried include: symptomatic treatment as advised by her  PCP Associated Signs and Symptoms: Patient reports having increase swelling. 65 year old with a past medical history significant for MS, urinary incontinence, and obesity. She has been seen in the wound clinic before for lower extremity ulcerations treated with compression therapy. she is also known to have hypertension, peripheral vascular disease, COPD, obstructive sleep apnea, lumbar radiculopathy,  kyphoscoliosis, urinary issues and tobacco abuse. [place] a packet of cigarettes a day was recently seen at the [facility] for swelling of her legs and feet with a ulceration on the dorsum of the right foot which has been there for about 6 months. she was recently in the ER about a month ago where she was seen for shortness of breath and swelling of the legs and a chest x-ray was within normal limits had an increase in her BNP and was given Lasix and put on doxycycline for a mild cellulitis and possible UTI. Objective Constitutional Pulse regular. Respirations normal and unlabored. Afebrile. Vitals Time Taken: 2:32 PM, Height: 63 in, Weight: 270 lbs, BMI: 47.8, Temperature: 98.0 F, Pulse: 78 bpm, Respiratory Rate: 20 breaths/min, Blood Pressure: 132/72 mmHg. Lafoe, Kordelia H. ([id number]) Eyes Nonicteric. Reactive to light. Ears, Nose, Mouth, and Throat Lips, teeth, and gums WNL.Marland Kitchen Moist mucosa without lesions. Neck supple and nontender. No palpable supraclavicular or cervical adenopathy. Normal sized without goiter. Respiratory WNL. No retractions.. Cardiovascular Pedal Pulses WNL. No clubbing, cyanosis or edema. Lymphatic No adneopathy. No adenopathy. No adenopathy. Musculoskeletal Adexa without tenderness or enlargement.. Digits and nails w/o clubbing, cyanosis, infection, petechiae, ischemia, or inflammatory conditions.Marland Kitchen Psychiatric Judgement and insight Intact.. No evidence of depression, anxiety, or agitation.. General Notes: left leg looks fairly clean but the right leg required debridement both on the dorsum and the right lateral leg. Done with a #3 curet and bleeding controlled with pressure Integumentary (Hair, Skin) No suspicious lesions. No crepitus or fluctuance. No peri-wound warmth or erythema. No masses.. Wound #2 status is Open. Original cause of wound was Trauma. The wound is located on the Right,Dorsal Foot. The wound measures 0.5cm length x 1.2cm width  x 0.2cm depth; 0.471cm^2 area and 0.094cm^3 volume. The wound is limited to skin breakdown. There is no tunneling or undermining noted. There is a large amount of serous drainage noted. The wound margin is flat and intact. There is small (1-33%) red granulation within the wound bed. There is a medium (34-66%) amount of necrotic tissue within the wound bed including Adherent Slough. The periwound skin appearance exhibited: Localized Edema, Ecchymosis. The periwound skin appearance did not exhibit: Callus, Crepitus, Excoriation, Fluctuance, Friable, Induration, Rash, Scarring, Dry/Scaly, Maceration, Moist, Atrophie Blanche, Cyanosis, Hemosiderin Staining, Mottled, Pallor, Rubor, Erythema. Wound #3 status is Open. Original cause of wound was Trauma. The wound is located on the Right Lower Leg. The wound measures 0.9cm length x 0.8cm width x 0.3cm depth; 0.565cm^2 area and 0.17cm^3 volume. The wound is limited to skin breakdown. There is no tunneling or undermining noted. There is a large amount of serous drainage noted. The wound margin is flat and intact. There is small (1-33%) pink granulation within the wound bed. There is a large (67-100%) amount of necrotic tissue within the wound bed including Eschar and Adherent Slough. The periwound skin appearance exhibited: Localized Edema. The periwound skin appearance did not exhibit: Callus, Crepitus, Excoriation, Fluctuance, Friable, Bobrowski, Cailah H. ([id number]) Induration, Rash, Scarring, Dry/Scaly, Maceration, Moist, Atrophie Blanche, Cyanosis, Ecchymosis, Hemosiderin Staining, Mottled, Pallor, Rubor, Erythema. Wound #4 status is Open. Original cause of wound was Trauma. The wound  is located on the Left,Lateral Lower Leg. The wound measures 0.4cm length x 0.4cm width x 0.1cm depth; 0.126cm^2 area and 0.013cm^3 volume. The wound is limited to skin breakdown. There is no tunneling or undermining noted. There is a large amount of serous drainage noted.  The wound margin is flat and intact. There is no granulation within the wound bed. There is a large (67-100%) amount of necrotic tissue within the wound bed including Adherent Slough. The periwound skin appearance exhibited: Localized Edema. The periwound skin appearance did not exhibit: Callus, Crepitus, Excoriation, Fluctuance, Friable, Induration, Rash, Scarring, Dry/Scaly, Maceration, Moist, Atrophie Blanche, Cyanosis, Ecchymosis, Hemosiderin Staining, Mottled, Pallor, Rubor, Erythema. Assessment Active Problems ICD-10 G35 - Multiple sclerosis E66.01 - Morbid (severe) obesity due to excess calories L97.512 - Non-pressure chronic ulcer of other part of right foot with fat layer exposed L97.212 - Non-pressure chronic ulcer of right calf with fat layer exposed L97.222 - Non-pressure chronic ulcer of left calf with fat layer exposed F17.218 - Nicotine dependence, cigarettes, with other nicotine-induced disorders Procedures Wound #2 Wound #2 is a Trauma, Other located on the Right,Dorsal Foot . There was a Non-Viable Tissue Open Wound/Selective [phone]) debridement with total area of 0.6 sq cm performed by Christin Fudge, MD. with the following instrument(s): Curette to remove Non-Viable tissue/material including Fibrin/Slough and Subcutaneous after achieving pain control using Lidocaine 4% Topical Solution. A time out was conducted prior to the start of the procedure. A Minimum amount of bleeding was controlled with Pressure. The procedure was tolerated well with a pain level of 0 throughout and a pain level of 0 following the procedure. Post Debridement Measurements: 0.5cm length x 1.2cm width x 0.2cm depth; 0.094cm^3 volume. Post procedure Diagnosis Wound #2: Same as Pre-Procedure Wound #3 Wound #3 is a Trauma, Other located on the Right Lower Leg . There was a Non-Viable Tissue Open Wound/Selective [phone]) debridement with total area of 0.72 sq cm performed by Christin Fudge,  MD. with the following instrument(s): Curette to remove Non-Viable tissue/material including Fibrin/Slough and Ingalls, Kassandra H. ([id number]) Subcutaneous after achieving pain control using Lidocaine 4% Topical Solution. A time out was conducted prior to the start of the procedure. A Minimum amount of bleeding was controlled with Pressure. The procedure was tolerated well with a pain level of 0 throughout and a pain level of 0 following the procedure. Post Debridement Measurements: 0.9cm length x 0.8cm width x 0.3cm depth; 0.17cm^3 volume. Post procedure Diagnosis Wound #3: Same as Pre-Procedure Plan Wound Cleansing: Wound #2 Right,Dorsal Foot: May Shower, gently pat wound dry prior to applying new dressing. Wound #3 Right Lower Leg: May Shower, gently pat wound dry prior to applying new dressing. Wound #4 Left,Lateral Lower Leg: May Shower, gently pat wound dry prior to applying new dressing. Anesthetic: Wound #2 Right,Dorsal Foot: Topical Lidocaine 4% cream applied to wound bed prior to debridement - in clinic only Wound #3 Right Lower Leg: Topical Lidocaine 4% cream applied to wound bed prior to debridement - in clinic only Wound #4 Left,Lateral Lower Leg: Topical Lidocaine 4% cream applied to wound bed prior to debridement - in clinic only Skin Barriers/Peri-Wound Care: Wound #2 Right,Dorsal Foot: Skin Prep Wound #3 Right Lower Leg: Skin Prep Wound #4 Left,Lateral Lower Leg: Skin Prep Primary Wound Dressing: Wound #2 Right,Dorsal Foot: Medihoney gel Wound #3 Right Lower Leg: Medihoney gel Wound #4 Left,Lateral Lower Leg: Medihoney gel Secondary Dressing: Wound #2 Right,Dorsal Foot: Boardered Foam Dressing Wound #3 Right Lower Leg: Boardered Foam Dressing Wound #4  Left,Lateral Lower Leg: Boardered Foam Dressing Dressing Change Frequency: Wound #2 Right,Dorsal Foot: Wolpert, Emmett H. ([id number]) Change dressing every day. Wound #3 Right Lower Leg: Change dressing every  day. Wound #4 Left,Lateral Lower Leg: Change dressing every day. Follow-up Appointments: Wound #2 Right,Dorsal Foot: Return Appointment in 1 week. Wound #3 Right Lower Leg: Return Appointment in 1 week. Wound #4 Left,Lateral Lower Leg: Return Appointment in 1 week. Additional Orders / Instructions: Wound #2 Right,Dorsal Foot: Stop Smoking Increase protein intake. Wound #3 Right Lower Leg: Stop Smoking Increase protein intake. Wound #4 Left,Lateral Lower Leg: Stop Smoking Increase protein intake. Medications-please add to medication list.: Wound #2 Right,Dorsal Foot: Other: - Medihoney/Manuka Honey Wound #3 Right Lower Leg: Other: - Medihoney/Manuka Honey Wound #4 Left,Lateral Lower Leg: Other: - Medihoney/Manuka Honey I have recommended: 1. Using Medihoney over the wound and applying a bordered foam over this. 2. washing her legs well with soap and water and making sure she does her dressing as advised 3. again addressed giving up smoking and discused the benefits 4. Seeing her back for regular visits Electronic Signature(s) Signed: 11/30/2015 3:01:09 PM By: Christin Fudge MD, FACS Entered By: Christin Fudge on 11/30/2015 15:01:09 [name] ([id number]) -------------------------------------------------------------------------------- SuperBill Details Patient Name: [name] Date of Service: 11/30/2015 Medical Record Number: [id number] Patient Account Number: [id number] Date of Birth/Sex: 02/18/1951 (64 y.o. 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 layer exposed L97.212 Non-pressure chronic ulcer of right calf with fat  layer exposed L97.222 Non-pressure chronic ulcer of left calf with fat layer exposed F17.218 Nicotine dependence, cigarettes, with other nicotine-induced disorders Facility Procedures CPT4 Code Description: [id number] - DEBRIDE WOUND 1ST 20 SQ CM OR < ICD-10 Description Diagnosis L97.512 Non-pressure chronic ulcer of other part of right foo L97.212 Non-pressure chronic ulcer of right calf with fat lay Modifier: t with fat la er exposed Quantity: 1 yer exposed Physician Procedures CPT4 Code Description: [id number] - WC PHYS DEBR WO ANESTH 20 SQ CM ICD-10 Description Diagnosis L97.512 Non-pressure chronic ulcer of other part of right foo L97.212 Non-pressure chronic ulcer of right calf with fat lay Modifier: t with fat lay er exposed Quantity: 1 er exposed Electronic Signature(s) Signed: 11/30/2015 3:01:21 PM By: Christin Fudge MD, FACS Entered By: Christin Fudge on 11/30/2015 15:01:21

## 2015-12-07 ENCOUNTER — Encounter: Payer: Medicare Other | Admitting: Surgery

## 2015-12-07 DIAGNOSIS — L97512 Non-pressure chronic ulcer of other part of right foot with fat layer exposed: Secondary | ICD-10-CM | POA: Diagnosis not present

## 2015-12-07 DIAGNOSIS — G4733 Obstructive sleep apnea (adult) (pediatric): Secondary | ICD-10-CM | POA: Diagnosis not present

## 2015-12-07 DIAGNOSIS — L97222 Non-pressure chronic ulcer of left calf with fat layer exposed: Secondary | ICD-10-CM | POA: Diagnosis not present

## 2015-12-07 DIAGNOSIS — L97212 Non-pressure chronic ulcer of right calf with fat layer exposed: Secondary | ICD-10-CM | POA: Diagnosis not present

## 2015-12-07 DIAGNOSIS — J449 Chronic obstructive pulmonary disease, unspecified: Secondary | ICD-10-CM | POA: Diagnosis not present

## 2015-12-07 DIAGNOSIS — Z79899 Other long term (current) drug therapy: Secondary | ICD-10-CM | POA: Diagnosis not present

## 2015-12-07 DIAGNOSIS — I739 Peripheral vascular disease, unspecified: Secondary | ICD-10-CM | POA: Diagnosis not present

## 2015-12-07 DIAGNOSIS — I10 Essential (primary) hypertension: Secondary | ICD-10-CM | POA: Diagnosis not present

## 2015-12-07 DIAGNOSIS — G35 Multiple sclerosis: Secondary | ICD-10-CM | POA: Diagnosis not present

## 2015-12-08 ENCOUNTER — Ambulatory Visit: Payer: Medicare Other | Admitting: Family Medicine

## 2015-12-08 NOTE — Progress Notes (Signed)
RONNITA, SEJA ([id number]) Visit Report for 12/07/2015 Chief Complaint Document Details Patient Name: [name], [name] Date of Service: 12/07/2015 12:45 PM Medical Record Number: [id number] Patient Account Number: [id number] Date of Birth/Sex: [birth year] (65 y.o. Female) Treating RN: Montey Hora Primary Care Physician: LADA, [place] Other Clinician: Referring Physician: LADA, [place] Treating Physician/Extender: Frann Rider in Treatment: 2 Information Obtained from: Patient Chief Complaint Patient seen for complaints of Non-Healing Wound to the left and right lower extremity and the right dorsum of the foot for [name] 6 months now Electronic Signature(s) Signed: 12/07/2015 1:19:37 PM By: Christin Fudge MD, FACS Entered By: Christin Fudge on 12/07/2015 13:19:37 [name] ([id number]) -------------------------------------------------------------------------------- Debridement Details Patient Name: [name] Date of Service: 12/07/2015 12:45 PM Medical Record Number: [id number] Patient Account Number: [id number] Date of Birth/Sex: 23-Feb-1951 (64 y.o. Female) Treating RN: Montey Hora Primary Care Physician: LADA, [place] Other Clinician: Referring Physician: LADA, [place] Treating Physician/Extender: Frann Rider in Treatment: 2 Debridement Performed for Wound #2 Right,Dorsal Foot Assessment: Performed By: Physician Christin Fudge, MD Debridement: Debridement Pre-procedure Yes Verification/Time Out Taken: Start Time: 13:08 Pain Control: Lidocaine 4% Topical Solution Level: Skin/Subcutaneous Tissue Total Area Debrided (L x 0.5 (cm) x 1.1 (cm) = 0.55 (cm) W): Tissue and other Non-Viable, Fibrin/Slough, Subcutaneous material debrided: Instrument: Curette Bleeding: Minimum Hemostasis Achieved: Pressure End Time: 13:14 Procedural Pain: 0 Post Procedural Pain: 0 Response to Treatment: Procedure was tolerated well Post Debridement Measurements of Total  Wound Length: (cm) 0.5 Width: (cm) 1.1 Depth: (cm) 0.2 Volume: (cm) 0.086 Post Procedure Diagnosis Same as Pre-procedure Electronic Signature(s) Signed: 12/07/2015 1:18:09 PM By: Christin Fudge MD, FACS Signed: 12/07/2015 4:38:29 PM By: Montey Hora Entered By: Christin Fudge on 12/07/2015 13:18:09 [name] ([id number]) -------------------------------------------------------------------------------- Debridement Details Patient Name: [name] Date of Service: 12/07/2015 12:45 PM Medical Record Number: [id number] Patient Account Number: [id number] Date of Birth/Sex: 05-12-1950 (64 y.o. Female) Treating RN: Montey Hora Primary Care Physician: LADA, [place] Other Clinician: Referring Physician: LADA, [place] Treating Physician/Extender: Frann Rider in Treatment: 2 Debridement Performed for Wound #3 Right Lower Leg Assessment: Performed By: Physician Christin Fudge, MD Debridement: Debridement Pre-procedure Yes Verification/Time Out Taken: Start Time: 13:08 Pain Control: Lidocaine 4% Topical Solution Level: Skin/Subcutaneous Tissue Total Area Debrided (L x 1 (cm) x 1 (cm) = 1 (cm) W): Tissue and other Non-Viable, Fibrin/Slough, Subcutaneous material debrided: Instrument: Curette Bleeding: Minimum Hemostasis Achieved: Pressure End Time: 13:14 Procedural Pain: 0 Post Procedural Pain: 0 Response to Treatment: Procedure was tolerated well Post Debridement Measurements of Total Wound Length: (cm) 1.1 Width: (cm) 1 Depth: (cm) 0.2 Volume: (cm) 0.173 Post Procedure Diagnosis Same as Pre-procedure Electronic Signature(s) Signed: 12/07/2015 1:18:51 PM By: Christin Fudge MD, FACS Signed: 12/07/2015 4:38:29 PM By: Montey Hora Entered By: Christin Fudge on 12/07/2015 13:18:51 [name] ([id number]) -------------------------------------------------------------------------------- Debridement Details Patient Name: [name] Date of Service:  12/07/2015 12:45 PM Medical Record Number: [id number] Patient Account Number: [id number] Date of Birth/Sex: Jan 08, 1951 (64 y.o. Female) Treating RN: Montey Hora Primary Care Physician: LADA, [place] Other Clinician: Referring Physician: LADA, MELINDA Treating Physician/Extender: Frann Rider in Treatment: 2 Debridement Performed for Wound #4 Left,Lateral Lower Leg Assessment: Performed By: Physician Christin Fudge, MD Debridement: Debridement Pre-procedure Yes Verification/Time Out Taken: Start Time: 13:08 Pain Control: Lidocaine 4% Topical Solution Level: Skin/Subcutaneous Tissue Total Area Debrided (L x 0.9 (cm) x 1 (cm) = 0.9 (cm) W): Tissue and other Viable, Non-Viable, Fibrin/Slough, Subcutaneous material debrided: Instrument: Curette Bleeding: Minimum Hemostasis  Achieved: Pressure End Time: 13:14 Procedural Pain: 0 Post Procedural Pain: 0 Response to Treatment: Procedure was tolerated well Post Debridement Measurements of Total Wound Length: (cm) 0.9 Width: (cm) 1 Depth: (cm) 0.1 Volume: (cm) 0.071 Post Procedure Diagnosis Same as Pre-procedure Electronic Signature(s) Signed: 12/07/2015 1:19:28 PM By: Christin Fudge MD, FACS Signed: 12/07/2015 4:38:29 PM By: Montey Hora Entered By: Christin Fudge on 12/07/2015 13:19:28 [name] ([id number]) -------------------------------------------------------------------------------- HPI Details Patient Name: [name] Date of Service: 12/07/2015 12:45 PM Medical Record Number: [id number] Patient Account Number: [id number] Date of Birth/Sex: 03/24/1951 (64 y.o. Female) Treating RN: Montey Hora Primary Care Physician: LADA, [place] Other Clinician: Referring Physician: LADA, MELINDA Treating Physician/Extender: Frann Rider in Treatment: 2 History of Present Illness Location: left and right lower extremity and dorsum of the right foot Quality: Patient reports experiencing a sharp pain to  affected area(s). Severity: Patient states wound are getting worse. Duration: Patient has had the wound for > 6 months prior to seeking treatment at the wound center Timing: Pain in wound is Intermittent (comes and goes Context: The wound appeared gradually over time Modifying Factors: Other treatment(s) tried include: symptomatic treatment as advised by her PCP Associated Signs and Symptoms: Patient reports having increase swelling. HPI Description: 65 year old with a past medical history significant for MS, urinary incontinence, and obesity. She has been seen in the wound clinic before for lower extremity ulcerations treated with compression therapy. she is also known to have hypertension, peripheral vascular disease, COPD, obstructive sleep apnea, lumbar radiculopathy, kyphoscoliosis, urinary issues and tobacco abuse. [name] a month ago where she was seen for shortness of breath and swelling of the legs and a chest x-ray was within normal limits had an increase in her BNP and was given Lasix and put on doxycycline for a mild cellulitis and possible UTI. Electronic Signature(s) Signed: 12/07/2015 1:19:48 PM By: Christin Fudge MD, FACS Entered By: Christin Fudge on 12/07/2015 13:19:46 [name] ([id number]) -------------------------------------------------------------------------------- Physical Exam Details Patient Name: [name] Date of Service: 12/07/2015 12:45 PM Medical Record Number: [id number] Patient Account Number: [id number] Date of Birth/Sex: Mar 25, 1951 (64 y.o. Female) Treating RN: Montey Hora Primary Care Physician: LADA, [place] Other Clinician: Referring Physician: LADA, MELINDA Treating Physician/Extender: Frann Rider  in Treatment: 2 Constitutional . Pulse regular. Respirations normal and unlabored. Afebrile. . Eyes Nonicteric. Reactive to light. Ears, Nose, Mouth, and Throat Lips, teeth, and gums WNL.Marland Kitchen Moist mucosa without lesions. Neck supple and nontender. No palpable supraclavicular or cervical adenopathy. Normal sized without goiter. Respiratory WNL. No retractions.. Breath sounds WNL, No rubs, rales, rhonchi, or wheeze.. Cardiovascular Heart rhythm and rate regular, no murmur or gallop.. Pedal Pulses WNL. No clubbing, cyanosis or edema. Chest Breasts symmetical and no nipple discharge.. Breast tissue WNL, no masses, lumps, or tenderness.. Lymphatic No adneopathy. No adenopathy. No adenopathy. Musculoskeletal Adexa without tenderness or enlargement.. Digits and nails w/o clubbing, cyanosis, infection, petechiae, ischemia, or inflammatory conditions.. Integumentary (Hair, Skin) No suspicious lesions. No crepitus or fluctuance. No peri-wound warmth or erythema. No masses.Marland Kitchen Psychiatric Judgement and insight Intact.. No evidence of depression, anxiety, or agitation.. Notes wounds are all covered with some subcutaneous debris which are sharply removed with a #3 curet and bleeding controlled with  pressure Electronic Signature(s) Signed: 12/07/2015 1:20:12 PM By: Christin Fudge MD, FACS Entered By: Christin Fudge on 12/07/2015 13:20:11 [name] ([id number]) -------------------------------------------------------------------------------- Physician Orders Details Patient Name: [name] Date of Service: 12/07/2015 12:45 PM Medical Record Number: [id number] Patient Account Number: [id number] Date of Birth/Sex: Apr 29, 1951 (64 y.o. Female) Treating RN: Afful, RN, BSN, Velva Harman Primary Care Physician: LADA, [place] Other Clinician: Referring Physician: LADA, MELINDA Treating Physician/Extender: Frann Rider in Treatment: 2 Verbal / Phone Orders: Yes Clinician: Afful, RN, BSN, Rita Read  Back and Verified: Yes Diagnosis Coding Wound Cleansing Wound #2 Right,Dorsal Foot o May Shower, gently pat wound dry prior to applying new dressing. Wound #3 Right Lower Leg o May Shower, gently pat wound dry prior to applying new dressing. Wound #4 Left,Lateral Lower Leg o May Shower, gently pat wound dry prior to applying new dressing. Anesthetic Wound #2 Right,Dorsal Foot o Topical Lidocaine 4% cream applied to wound bed prior to debridement - in clinic only Wound #3 Right Lower Leg o Topical Lidocaine 4% cream applied to wound bed prior to debridement - in clinic only Wound #4 Left,Lateral Lower Leg o Topical Lidocaine 4% cream applied to wound bed prior to debridement - in clinic only Skin Barriers/Peri-Wound Care Wound #2 Right,Dorsal Foot o Skin Prep Wound #3 Right Lower Leg o Skin Prep Wound #4 Left,Lateral Lower Leg o Skin Prep Primary Wound Dressing Wound #2 Right,Dorsal Foot o Medihoney gel Wound #3 Right Lower Leg o Medihoney gel Splawn, Mihaela H. ([id number]) Wound #4 Left,Lateral Lower Leg o Medihoney gel Secondary Dressing Wound #2 Right,Dorsal Foot o Boardered Foam Dressing Wound #3 Right Lower Leg o Boardered Foam Dressing Wound #4 Left,Lateral Lower Leg o Boardered Foam Dressing Dressing Change Frequency Wound #2 Right,Dorsal Foot o Change dressing every day. Wound #3 Right Lower Leg o Change dressing every day. Wound #4 Left,Lateral Lower Leg o Change dressing every day. Follow-up Appointments Wound #2 Right,Dorsal Foot o Return Appointment in 1 week. Wound #3 Right Lower Leg o Return Appointment in 1 week. Wound #4 Left,Lateral Lower Leg o Return Appointment in 1 week. Additional Orders / Instructions Wound #2 Right,Dorsal Foot o Stop Smoking o Increase protein intake. Wound #3 Right Lower Leg o Stop Smoking o Increase protein intake. Wound #4 Left,Lateral Lower Leg o Stop Smoking o  Increase protein intake. [name], [name] ([id number]) Medications-please add to medication list. Wound #2 Right,Dorsal Foot o Other: - Medihoney/Manuka Honey Wound #3 Right Lower Leg o Other: - Medihoney/Manuka Honey Wound #4 Left,Lateral Lower Leg o Other: - Medihoney/Manuka Honey Electronic Signature(s) Signed: 12/07/2015 4:56:45 PM By: Christin Fudge MD, FACS Signed: 12/07/2015 4:59:27 PM By: Regan Lemming BSN, RN Entered By: Regan Lemming on 12/07/2015 13:14:08 [name] ([id number]) -------------------------------------------------------------------------------- Problem List Details Patient Name: [name] Date of Service: 12/07/2015 12:45 PM Medical Record Number: [id number] Patient Account Number: [id number] Date of Birth/Sex: 03-26-1951 (64 y.o. 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 11/20/2015 Yes exposed L97.222 Non-pressure chronic ulcer of left calf with fat layer 11/20/2015 Yes exposed F17.218 Nicotine dependence, cigarettes, with other nicotine- 11/20/2015 Yes induced disorders  Inactive Problems Resolved Problems Electronic Signature(s) Signed: 12/07/2015 1:17:53 PM By: Christin Fudge MD, FACS Entered By: Christin Fudge on 12/07/2015 13:17:52 [name] ([id number]) -------------------------------------------------------------------------------- Progress Note Details Patient Name: [name] Date of Service: 12/07/2015 12:45 PM Medical Record Number:  [id number] Patient Account Number: [id number] Date of Birth/Sex: 1950-12-15 (64 y.o. Female) Treating RN: Montey Hora Primary Care Physician: LADA, [place] Other Clinician: Referring Physician: LADA, MELINDA Treating Physician/Extender: Frann Rider in Treatment: 2 Subjective Chief Complaint Information obtained from Patient Patient seen for complaints of Non-Healing Wound to the left and right lower extremity and the right dorsum of the foot for [name] 6 months now History of Present Illness (HPI) The following HPI elements were documented for the patient's wound: Location: left and right lower extremity and dorsum of the right foot Quality: Patient reports experiencing a sharp pain to affected area(s). Severity: Patient states wound are getting worse. Duration: Patient has had the wound for > 6 months prior to seeking treatment at the wound center Timing: Pain in wound is Intermittent (comes and goes Context: The wound appeared gradually over time Modifying Factors: Other treatment(s) tried include: symptomatic treatment as advised by her PCP Associated Signs and Symptoms: Patient reports having increase swelling. 65 year old with a past medical history significant for MS, urinary incontinence, and obesity. She has been seen in the wound clinic before for lower extremity ulcerations treated with compression therapy. she is also known to have hypertension, peripheral vascular disease, COPD, obstructive sleep apnea, lumbar radiculopathy, kyphoscoliosis, urinary issues and tobacco abuse. [name] a packet of cigarettes a day was recently seen at the [facility] for swelling of her legs and feet with a ulceration on the dorsum of the right foot which has been there for [name] 6 months. she was recently in the ER [name] a month ago where she was seen for shortness of breath and swelling of the legs and a chest x-ray was within normal limits had an increase in her BNP and was  given Lasix and put on doxycycline for a mild cellulitis and possible UTI. Objective Constitutional Pulse regular. Respirations normal and unlabored. Afebrile. Vitals Time Taken: 12:51 PM, Height: 63 in, Weight: 270 lbs, BMI: 47.8, Temperature: 98.4 F, Pulse: 77 bpm, Respiratory Rate: 20 breaths/min, Blood Pressure: 132/63 mmHg. Wolfman, Kahealani H. ([id number]) Eyes Nonicteric. Reactive to light. Ears, Nose, Mouth, and Throat Lips, teeth, and gums WNL.Marland Kitchen Moist mucosa without lesions. Neck supple and nontender. No palpable supraclavicular or cervical adenopathy. Normal sized without goiter. Respiratory WNL. No retractions.. Breath sounds WNL, No rubs, rales, rhonchi, or wheeze.. Cardiovascular Heart rhythm and rate regular, no murmur or gallop.. Pedal Pulses WNL. No clubbing, cyanosis or edema. Chest Breasts symmetical and no nipple discharge.. Breast tissue WNL, no masses, lumps, or tenderness.. Lymphatic No adneopathy. No adenopathy. No adenopathy. Musculoskeletal Adexa without tenderness or enlargement.. Digits and nails w/o clubbing, cyanosis, infection, petechiae, ischemia, or inflammatory conditions.Marland Kitchen Psychiatric Judgement and insight Intact.. No evidence of depression, anxiety, or agitation.. General Notes: wounds are all covered with some subcutaneous debris which are sharply removed with a #3 curet and bleeding controlled with pressure Integumentary (Hair, Skin) No suspicious lesions. No crepitus or fluctuance. No peri-wound warmth or erythema. No masses.. Wound #2 status is Open. Original cause of wound was Trauma. The wound is located on the Right,Dorsal Foot. The wound measures 0.5cm length x 1.1cm width x 0.2cm depth; 0.432cm^2 area and 0.086cm^3 volume. The wound is limited to skin breakdown. There is no tunneling  or undermining noted. There is a large amount of serous drainage noted. The wound margin is flat and intact. There is small (1-33%) red granulation within the  wound bed. There is a medium (34-66%) amount of necrotic tissue within the wound bed including Adherent Slough. The periwound skin appearance exhibited: Localized Edema, Ecchymosis. The periwound skin appearance did not exhibit: Callus, Crepitus, Excoriation, Fluctuance, Friable, Induration, Rash, Scarring, Dry/Scaly, Maceration, Moist, Atrophie Blanche, Cyanosis, Hemosiderin Staining, Mottled, Pallor, Rubor, Erythema. Wound #3 status is Open. Original cause of wound was Trauma. The wound is located on the Right Lower Leg. The wound measures 1.1cm length x 1cm width x 0.2cm depth; 0.864cm^2 area and 0.173cm^3 volume. The wound is limited to skin breakdown. There is no tunneling or undermining noted. There is a large amount of serous drainage noted. The wound margin is flat and intact. There is small (1-33%) pink Dagostino, Virdie H. ([id number]) granulation within the wound bed. There is a large (67-100%) amount of necrotic tissue within the wound bed including Eschar and Adherent Slough. The periwound skin appearance exhibited: Localized Edema. The periwound skin appearance did not exhibit: Callus, Crepitus, Excoriation, Fluctuance, Friable, Induration, Rash, Scarring, Dry/Scaly, Maceration, Moist, Atrophie Blanche, Cyanosis, Ecchymosis, Hemosiderin Staining, Mottled, Pallor, Rubor, Erythema. Wound #4 status is Open. Original cause of wound was Trauma. The wound is located on the Left,Lateral Lower Leg. The wound measures 0.9cm length x 1cm width x 0.1cm depth; 0.707cm^2 area and 0.071cm^3 volume. The wound is limited to skin breakdown. There is no tunneling or undermining noted. There is a large amount of serous drainage noted. The wound margin is flat and intact. There is no granulation within the wound bed. There is a large (67-100%) amount of necrotic tissue within the wound bed including Adherent Slough. The periwound skin appearance exhibited: Localized Edema. The periwound skin appearance did  not exhibit: Callus, Crepitus, Excoriation, Fluctuance, Friable, Induration, Rash, Scarring, Dry/Scaly, Maceration, Moist, Atrophie Blanche, Cyanosis, Ecchymosis, Hemosiderin Staining, Mottled, Pallor, Rubor, Erythema. Assessment Active Problems ICD-10 G35 - Multiple sclerosis E66.01 - Morbid (severe) obesity due to excess calories L97.512 - Non-pressure chronic ulcer of other part of right foot with fat layer exposed L97.212 - Non-pressure chronic ulcer of right calf with fat layer exposed L97.222 - Non-pressure chronic ulcer of left calf with fat layer exposed F17.218 - Nicotine dependence, cigarettes, with other nicotine-induced disorders Procedures Wound #2 Wound #2 is a Trauma, Other located on the Right,Dorsal Foot . There was a Skin/Subcutaneous Tissue Debridement [id number] debridement with total area of 0.55 sq cm performed by Christin Fudge, MD. with the following instrument(s): Curette to remove Non-Viable tissue/material including Fibrin/Slough and Subcutaneous after achieving pain control using Lidocaine 4% Topical Solution. A time out was conducted prior to the start of the procedure. A Minimum amount of bleeding was controlled with Pressure. The procedure was tolerated well with a pain level of 0 throughout and a pain level of 0 following the procedure. Post Debridement Measurements: 0.5cm length x 1.1cm width x 0.2cm depth; 0.086cm^3 volume. Post procedure Diagnosis Wound #2: Same as Pre-Procedure Wound #3 Lokken, Ruby H. ([id number]) Wound #3 is a Trauma, Other located on the Right Lower Leg . There was a Skin/Subcutaneous Tissue Debridement [id number] debridement with total area of 1 sq cm performed by Christin Fudge, MD. with the following instrument(s): Curette to remove Non-Viable tissue/material including Fibrin/Slough and Subcutaneous after achieving pain control using Lidocaine 4% Topical Solution. A time out was conducted prior to the start of the procedure. A  Minimum amount of bleeding was controlled with Pressure. The procedure was tolerated well with a pain level of 0 throughout and a pain level of 0 following the procedure. Post Debridement Measurements: 1.1cm length x 1cm width x 0.2cm depth; 0.173cm^3 volume. Post procedure Diagnosis Wound #3: Same as Pre-Procedure Wound #4 Wound #4 is a Venous Leg Ulcer located on the Left,Lateral Lower Leg . There was a Skin/Subcutaneous Tissue Debridement [id number] debridement with total area of 0.9 sq cm performed by Christin Fudge, MD. with the following instrument(s): Curette to remove Viable and Non-Viable tissue/material including Fibrin/Slough and Subcutaneous after achieving pain control using Lidocaine 4% Topical Solution. A time out was conducted prior to the start of the procedure. A Minimum amount of bleeding was controlled with Pressure. The procedure was tolerated well with a pain level of 0 throughout and a pain level of 0 following the procedure. Post Debridement Measurements: 0.9cm length x 1cm width x 0.1cm depth; 0.071cm^3 volume. Post procedure Diagnosis Wound #4: Same as Pre-Procedure Plan Wound Cleansing: Wound #2 Right,Dorsal Foot: May Shower, gently pat wound dry prior to applying new dressing. Wound #3 Right Lower Leg: May Shower, gently pat wound dry prior to applying new dressing. Wound #4 Left,Lateral Lower Leg: May Shower, gently pat wound dry prior to applying new dressing. Anesthetic: Wound #2 Right,Dorsal Foot: Topical Lidocaine 4% cream applied to wound bed prior to debridement - in clinic only Wound #3 Right Lower Leg: Topical Lidocaine 4% cream applied to wound bed prior to debridement - in clinic only Wound #4 Left,Lateral Lower Leg: Topical Lidocaine 4% cream applied to wound bed prior to debridement - in clinic only Skin Barriers/Peri-Wound Care: Wound #2 Right,Dorsal Foot: Skin Prep Wound #3 Right Lower Leg: Skin Prep Wound #4 Left,Lateral Lower Leg: Skin  Prep Primary Wound Dressing: TASHONA, JALIL ([id number]) Wound #2 Right,Dorsal Foot: Medihoney gel Wound #3 Right Lower Leg: Medihoney gel Wound #4 Left,Lateral Lower Leg: Medihoney gel Secondary Dressing: Wound #2 Right,Dorsal Foot: Boardered Foam Dressing Wound #3 Right Lower Leg: Boardered Foam Dressing Wound #4 Left,Lateral Lower Leg: Boardered Foam Dressing Dressing Change Frequency: Wound #2 Right,Dorsal Foot: Change dressing every day. Wound #3 Right Lower Leg: Change dressing every day. Wound #4 Left,Lateral Lower Leg: Change dressing every day. Follow-up Appointments: Wound #2 Right,Dorsal Foot: Return Appointment in 1 week. Wound #3 Right Lower Leg: Return Appointment in 1 week. Wound #4 Left,Lateral Lower Leg: Return Appointment in 1 week. Additional Orders / Instructions: Wound #2 Right,Dorsal Foot: Stop Smoking Increase protein intake. Wound #3 Right Lower Leg: Stop Smoking Increase protein intake. Wound #4 Left,Lateral Lower Leg: Stop Smoking Increase protein intake. Medications-please add to medication list.: Wound #2 Right,Dorsal Foot: Other: - Medihoney/Manuka Honey Wound #3 Right Lower Leg: Other: - Medihoney/Manuka Honey Wound #4 Left,Lateral Lower Leg: Other: - Medihoney/Manuka Honey Stracener, Evaleigh H. ([id number]) I have recommended: 1. Using Medihoney over the wound and applying a bordered foam over this. 2. washing her legs well with soap and water and making sure she does her dressing as advised 3. again addressed giving up smoking and discused the benefits 4. Seeing her back for regular visits Electronic Signature(s) Signed: 12/07/2015 1:21:05 PM By: Christin Fudge MD, FACS Entered By: Christin Fudge on 12/07/2015 13:21:05 [name] ([id number]) -------------------------------------------------------------------------------- SuperBill Details Patient Name: [name] Date of Service: 12/07/2015 Medical Record Number:  [id number] Patient Account Number: [id number] Date of Birth/Sex: June 17, 1950 (64 y.o. 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 calf with fat layer exposed F17.218 Nicotine dependence, cigarettes, with other nicotine-induced disorders Facility Procedures CPT4 Code Description: [id number] 11042 - DEB SUBQ TISSUE 20 SQ CM/< ICD-10 Description Diagnosis L97.512 Non-pressure chronic ulcer of other part of right fo L97.212 Non-pressure chronic ulcer of right calf with fat la L97.222 Non-pressure chronic ulcer  of left calf with fat lay Modifier: ot with fat la yer exposed er exposed Quantity: 1 yer exposed Physician Procedures CPT4 Code Description: [id number] 11042 - WC PHYS SUBQ TISS 20 SQ CM ICD-10 Description Diagnosis L97.512 Non-pressure chronic ulcer of other part of right fo L97.212 Non-pressure chronic ulcer of right calf with fat la L97.222 Non-pressure chronic ulcer of  left calf with fat lay Modifier: ot with fat lay yer exposed er exposed Quantity: 1 er exposed Electronic Signature(s) Signed: 12/07/2015 1:21:31 PM By: Christin Fudge MD, FACS Entered By: Christin Fudge on 12/07/2015 13:21:30

## 2015-12-08 NOTE — Progress Notes (Signed)
[name], [name] ([id number]) Visit Report for 12/07/2015 Arrival Information Details Patient Name: [name], [name] Date of Service: 12/07/2015 12:45 PM Medical Record Number: [id number] Patient Account Number: [id number] Date of Birth/Sex: [birth year] (65 y.o. Female) Treating RN: Montey Hora Primary Care Physician: LADA, [place] Other Clinician: Referring Physician: LADA, [place] Treating Physician/Extender: Frann Rider in Treatment: 2 Visit Information History Since Last Visit Added or deleted any medications: No Patient Arrived: [place] Any new allergies or adverse reactions: No Arrival Time: 12:48 Had a fall or experienced change in No Accompanied By: self activities of daily living that may affect Transfer Assistance: None risk of falls: Patient Identification Verified: Yes Signs or symptoms of abuse/neglect since last No Secondary Verification Process Completed: Yes visito Patient Requires Transmission-Based No Hospitalized since last visit: No Precautions: Pain Present Now: Yes Patient Has Alerts: Yes Electronic Signature(s) Signed: 12/07/2015 4:38:29 PM By: Montey Hora Entered By: Montey Hora on 12/07/2015 12:51:37 [name] -------------------------------------------------------------------------------- Encounter Discharge Information Details Patient Name: [name] Date of Service: 12/07/2015 12:45 PM Medical Record Number: [id number] Patient Account Number: [id number] Date of Birth/Sex: 01-May-1950 (64 y.o. Female) Treating RN: Montey Hora Primary Care Physician: LADA, [place] Other Clinician: Referring Physician: LADA, [place] Treating Physician/Extender: Frann Rider in Treatment: 2 Encounter Discharge Information Items Discharge Pain Level: 0 Discharge Condition: Stable Ambulatory Status: Walker Discharge Destination: Home Transportation: Private Auto Accompanied By: self Schedule Follow-up Appointment:  Yes Medication Reconciliation completed No and provided to Patient/Care Leolia Vinzant: Provided on Clinical Summary of Care: 12/07/2015 Form Type Recipient Paper Patient BW Electronic Signature(s) Signed: 12/07/2015 1:21:53 PM By: Ruthine Dose Entered By: Ruthine Dose on 12/07/2015 13:21:52 [name] -------------------------------------------------------------------------------- Lower Extremity Assessment Details Patient Name: [name] Date of Service: 12/07/2015 12:45 PM Medical Record Number: [id number] Patient Account Number: [id number] Date of Birth/Sex: Mar 05, 1951 (64 y.o. Female) Treating RN: Montey Hora Primary Care Physician: LADA, [place] Other Clinician: Referring Physician: LADA, [place] Treating Physician/Extender: Frann Rider in Treatment: 2 Vascular Assessment Pulses: Posterior Tibial Dorsalis Pedis Palpable: [Left:Yes] [Right:Yes] Extremity colors, hair growth, and conditions: Extremity Color: [Left:Red] [Right:Red] Hair Growth on Extremity: [Left:Yes] [Right:Yes] Temperature of Extremity: [Left:Warm] [Right:Warm] Capillary Refill: [Left:< 3 seconds] [Right:< 3 seconds] Electronic Signature(s) Signed: 12/07/2015 4:38:29 PM By: Montey Hora Entered By: Montey Hora on 12/07/2015 12:57:10 [name] -------------------------------------------------------------------------------- Multi Wound Chart Details Patient Name: [name] Date of Service: 12/07/2015 12:45 PM Medical Record Number: [id number] Patient Account Number: [id number] Date of Birth/Sex: [birth year] (64 y.o. Female) Treating RN: Afful, RN, BSN, Velva Harman Primary Care Physician: LADA, [place] Other Clinician: Referring Physician: LADA, MELINDA Treating Physician/Extender: Frann Rider in Treatment: 2 Vital Signs Height(in): 63 Pulse(bpm): 77 Weight(lbs): 270 Blood Pressure 132/63 (mmHg): Body Mass Index(BMI): 48 Temperature(F):  98.4 Respiratory Rate 20 (breaths/min): Photos: Wound Location: Right Foot - Dorsal Right Lower Leg Left Lower Leg - Lateral Wounding Event: Trauma Trauma Trauma Primary Etiology: Trauma, Other Trauma, Other Venous Leg Ulcer Comorbid History: Cataracts, Chronic Cataracts, Chronic Cataracts, Chronic Obstructive Pulmonary Obstructive Pulmonary Obstructive Pulmonary Disease (COPD), Sleep Disease (COPD), Sleep Disease (COPD), Sleep Apnea, Hypertension, Apnea, Hypertension, Apnea, Hypertension, Osteoarthritis, Neuropathy Osteoarthritis, Neuropathy Osteoarthritis, Neuropathy Date Acquired: 07/21/2015 10/09/2015 10/09/2015 Weeks of Treatment: 2 2 2  Wound Status: Open Open Open Measurements L x W x D 0.5x1.1x0.2 1.1x1x0.2 0.9x1x0.1 (cm) Area (cm) : 0.432 0.864 0.707 Volume (cm) : 0.086 0.173 0.071 % Reduction in Area: -9.90% -74.50% -652.10% % Reduction in Volume: -8.90% -16.90% -688.90% Classification: Partial Thickness  Partial Thickness Partial Thickness Exudate Amount: Large Large Large Exudate Type: Serous Serous Serous Exudate Color: amber amber amber Wound Margin: Flat and Intact Flat and Intact Flat and Intact Granulation Amount: Small (1-33%) Small (1-33%) None Present (0%) [name], [name] H. ([id number]) Granulation Quality: Red Pink N/A Necrotic Amount: Medium (34-66%) Large (67-100%) Large (67-100%) Necrotic Tissue: Adherent Slough Eschar, [place] Exposed Structures: Fascia: No Fascia: No Fascia: No Fat: No Fat: No Fat: No Tendon: No Tendon: No Tendon: No Muscle: No Muscle: No Muscle: No Joint: No Joint: No Joint: No Bone: No Bone: No Bone: No Limited to Skin Limited to Skin Limited to Skin Breakdown Breakdown Breakdown Epithelialization: None None None Periwound Skin Texture: Edema: Yes Edema: Yes Edema: Yes Excoriation: No Excoriation: No Excoriation: No Induration: No Induration: No Induration: No Callus: No Callus: No Callus:  No Crepitus: No Crepitus: No Crepitus: No Fluctuance: No Fluctuance: No Fluctuance: No Friable: No Friable: No Friable: No Rash: No Rash: No Rash: No Scarring: No Scarring: No Scarring: No Periwound Skin Maceration: No Maceration: No Maceration: No Moisture: Moist: No Moist: No Moist: No Dry/Scaly: No Dry/Scaly: No Dry/Scaly: No Periwound Skin Color: Ecchymosis: Yes Atrophie Blanche: No Atrophie Blanche: No Atrophie Blanche: No Cyanosis: No Cyanosis: No Cyanosis: No Ecchymosis: No Ecchymosis: No Erythema: No Erythema: No Erythema: No Hemosiderin Staining: No Hemosiderin Staining: No Hemosiderin Staining: No Mottled: No Mottled: No Mottled: No Pallor: No Pallor: No Pallor: No Rubor: No Rubor: No Rubor: No Tenderness on No No No Palpation: Wound Preparation: Ulcer Cleansing: Ulcer Cleansing: Ulcer Cleansing: Rinsed/Irrigated with Rinsed/Irrigated with Rinsed/Irrigated with Saline Saline Saline Topical Anesthetic Topical Anesthetic Topical Anesthetic Applied: Other: lidocaine Applied: Other: lidocaine Applied: Other: lidocaine 4% 4% 4% Treatment Notes Electronic Signature(s) Signed: 12/07/2015 4:59:27 PM By: Regan Lemming BSN, RN Entered By: Regan Lemming on 12/07/2015 13:10:51 [name] -------------------------------------------------------------------------------- Multi-Disciplinary Care Plan Details Patient Name: [name] Date of Service: 12/07/2015 12:45 PM Medical Record Number: [id number] Patient Account Number: [id number] Date of Birth/Sex: [birth year] (64 y.o. Female) Treating RN: Afful, RN, BSN, Velva Harman Primary Care Physician: LADA, Rip Harbour Other Clinician: Referring Physician: LADA, [place] Treating Physician/Extender: Frann Rider in Treatment: 2 Active Inactive Abuse / Safety / Falls / Self Care Management Nursing Diagnoses: Impaired physical mobility Potential for falls Goals: Patient will remain injury  free Date Initiated: 11/20/2015 Goal Status: Active Interventions: Assess fall risk on admission and as needed Notes: Nutrition Nursing Diagnoses: Imbalanced nutrition Goals: Patient/caregiver agrees to and verbalizes understanding of need to use nutritional supplements and/or vitamins as prescribed Date Initiated: 11/20/2015 Goal Status: Active Interventions: Provide education on nutrition Notes: Orientation to the Wound Care Program Nursing Diagnoses: Knowledge deficit related to the wound healing center program Goals: ATZHIRI, RATHSACK ([id number]) Patient/caregiver will verbalize understanding of the [place] Program Date Initiated: 11/20/2015 Goal Status: Active Interventions: Provide education on orientation to the wound center Notes: Wound/Skin Impairment Nursing Diagnoses: Impaired tissue integrity Goals: Patient will demonstrate a reduced rate of smoking or cessation of smoking Date Initiated: 11/20/2015 Goal Status: Active Ulcer/skin breakdown will heal within 14 weeks Date Initiated: 11/20/2015 Goal Status: Active Interventions: Assess patient/caregiver ability to obtain necessary supplies Provide education on smoking Notes: Electronic Signature(s) Signed: 12/07/2015 4:59:27 PM By: Regan Lemming BSN, RN Entered By: Regan Lemming on 12/07/2015 13:10:41 [name] -------------------------------------------------------------------------------- Pain Assessment Details Patient Name: [name] Date of Service: 12/07/2015 12:45 PM Medical Record Number: [id number] Patient Account Number: [id number] Date of Birth/Sex:  05/28/1950 (65 y.o. Female) Treating RN: Montey Hora Primary Care Physician: LADA, [place] Other Clinician: Referring Physician: LADA, MELINDA Treating Physician/Extender: Frann Rider in Treatment: 2 Active Problems Location of Pain Severity and Description of Pain Patient Has Paino Yes Site Locations Pain  Location: Pain in Ulcers With Dressing Change: Yes Duration of the Pain. Constant / Intermittento Constant Pain Management and Medication Current Pain Management: Notes Topical or injectable lidocaine is offered to patient for acute pain when surgical debridement is performed. If needed, Patient is instructed to use over the counter pain medication for the following 24-48 hours after debridement. Wound care MDs do not prescribed pain medications. Patient has chronic pain or uncontrolled pain. Patient has been instructed to make an appointment with their Primary Care Physician for pain management. Electronic Signature(s) Signed: 12/07/2015 4:38:29 PM By: Montey Hora Entered By: Montey Hora on 12/07/2015 12:51:55 [name] -------------------------------------------------------------------------------- Patient/Caregiver Education Details Patient Name: [name] Date of Service: 12/07/2015 12:45 PM Medical Record Number: [id number] Patient Account Number: [id number] Date of Birth/Gender: [birth year] (64 y.o. Female) Treating RN: Montey Hora Primary Care Physician: LADA, [place] Other Clinician: Referring Physician: LADA, [place] Treating Physician/Extender: Frann Rider in Treatment: 2 Education Assessment Education Provided To: Patient Education Topics Provided Wound/Skin Impairment: Handouts: Other: wound care as ordered Methods: Demonstration, Explain/Verbal Responses: State content correctly Electronic Signature(s) Signed: 12/07/2015 4:38:29 PM By: Montey Hora Entered By: Montey Hora on 12/07/2015 13:09:46 [name] -------------------------------------------------------------------------------- Wound Assessment Details Patient Name: [name] Date of Service: 12/07/2015 12:45 PM Medical Record Number: [id number] Patient Account Number: [id number] Date of Birth/Sex: May 31, 1950 (64 y.o. Female) Treating RN:  Montey Hora Primary Care Physician: LADA, [place] Other Clinician: Referring Physician: LADA, MELINDA Treating Physician/Extender: Frann Rider in Treatment: 2 Wound Status Wound Number: 2 Primary Trauma, Other Etiology: Wound Location: Right Foot - Dorsal Wound Open Wounding Event: Trauma Status: Date Acquired: 07/21/2015 Comorbid Cataracts, Chronic Obstructive Weeks Of Treatment: 2 History: Pulmonary Disease (COPD), Sleep Clustered Wound: No Apnea, Hypertension, Osteoarthritis, Neuropathy Photos Wound Measurements Length: (cm) 0.5 Width: (cm) 1.1 Depth: (cm) 0.2 Area: (cm) 0.432 Volume: (cm) 0.086 % Reduction in Area: -9.9% % Reduction in Volume: -8.9% Epithelialization: None Tunneling: No Undermining: No Wound Description Classification: Partial Thickness Wound Margin: Flat and Intact Exudate Amount: Large Exudate Type: Serous Exudate Color: amber Wound Bed Granulation Amount: Small (1-33%) Exposed Structure Granulation Quality: Red Fascia Exposed: No Necrotic Amount: Medium (34-66%) Fat Layer Exposed: No Necrotic Quality: Adherent Slough Tendon Exposed: No Widmayer, Jenevieve H. ([id number]) Muscle Exposed: No Joint Exposed: No Bone Exposed: No Limited to Skin Breakdown Periwound Skin Texture Texture Color No Abnormalities Noted: No No Abnormalities Noted: No Callus: No Atrophie Blanche: No Crepitus: No Cyanosis: No Excoriation: No Ecchymosis: Yes Fluctuance: No Erythema: No Friable: No Hemosiderin Staining: No Induration: No Mottled: No Localized Edema: Yes Pallor: No Rash: No Rubor: No Scarring: No Moisture No Abnormalities Noted: No Dry / Scaly: No Maceration: No Moist: No Wound Preparation Ulcer Cleansing: Rinsed/Irrigated with Saline Topical Anesthetic Applied: Other: lidocaine 4%, Treatment Notes Wound #2 (Right, Dorsal Foot) 1. Cleansed with: Clean wound with Normal Saline 2. Anesthetic Topical Lidocaine 4% cream to  wound bed prior to debridement 4. Dressing Applied: Medihoney Gel 5. Secondary Dressing Applied Dry [place] Signature(s) Signed: 12/07/2015 4:38:29 PM By: Montey Hora Entered By: Montey Hora on 12/07/2015 13:01:00 [name] -------------------------------------------------------------------------------- Wound Assessment Details Patient Name: [name] Date of Service: 12/07/2015 12:45  PM Medical Record Number: [id number] Patient Account Number: [id number] Date of Birth/Sex: 1950/12/19 (64 y.o. Female) Treating RN: Montey Hora Primary Care Physician: LADA, [place] Other Clinician: Referring Physician: LADA, MELINDA Treating Physician/Extender: Frann Rider in Treatment: 2 Wound Status Wound Number: 3 Primary Trauma, Other Etiology: Wound Location: Right Lower Leg Wound Open Wounding Event: Trauma Status: Date Acquired: 10/09/2015 Comorbid Cataracts, Chronic Obstructive Weeks Of Treatment: 2 History: Pulmonary Disease (COPD), Sleep Clustered Wound: No Apnea, Hypertension, Osteoarthritis, Neuropathy Photos Wound Measurements Length: (cm) 1.1 Width: (cm) 1 Depth: (cm) 0.2 Area: (cm) 0.864 Volume: (cm) 0.173 % Reduction in Area: -74.5% % Reduction in Volume: -16.9% Epithelialization: None Tunneling: No Undermining: No Wound Description Classification: Partial Thickness Wound Margin: Flat and Intact Exudate Amount: Large Exudate Type: Serous Exudate Color: amber Wound Bed Granulation Amount: Small (1-33%) Exposed Structure Granulation Quality: Pink Fascia Exposed: No Necrotic Amount: Large (67-100%) Fat Layer Exposed: No Necrotic Quality: Eschar, Adherent Slough Tendon Exposed: No [place]al[place], Leightyn H. ([id number]) Muscle Exposed: No Joint Exposed: No Bone Exposed: No Limited to Skin Breakdown Periwound Skin Texture Texture Color No Abnormalities Noted: No No Abnormalities Noted: No Callus:  No Atrophie Blanche: No Crepitus: No Cyanosis: No Excoriation: No Ecchymosis: No Fluctuance: No Erythema: No Friable: No Hemosiderin Staining: No Induration: No Mottled: No Localized Edema: Yes Pallor: No Rash: No Rubor: No Scarring: No Moisture No Abnormalities Noted: No Dry / Scaly: No Maceration: No Moist: No Wound Preparation Ulcer Cleansing: Rinsed/Irrigated with Saline Topical Anesthetic Applied: Other: lidocaine 4%, Treatment Notes Wound #3 (Right Lower Leg) 1. Cleansed with: Clean wound with Normal Saline 2. Anesthetic Topical Lidocaine 4% cream to wound bed prior to debridement 4. Dressing Applied: Medihoney Gel 5. Secondary Dressing Applied Dry [place] Signature(s) Signed: 12/07/2015 4:38:29 PM By: Montey Hora Entered By: Montey Hora on 12/07/2015 13:01:20 [name] ([id number]) -------------------------------------------------------------------------------- Wound Assessment Details Patient Name: [name] Date of Service: 12/07/2015 12:45 PM Medical Record Number: [id number] Patient Account Number: [id number] Date of Birth/Sex: 11-15-1950 (65 y.o. Female) Treating RN: Montey Hora Primary Care Physician: LADA, [place] Other Clinician: Referring Physician: LADA, MELINDA Treating Physician/Extender: Frann Rider in Treatment: 2 Wound Status Wound Number: 4 Primary Venous Leg Ulcer Etiology: Wound Location: Left Lower Leg - Lateral Wound Open Wounding Event: Trauma Status: Date Acquired: 10/09/2015 Comorbid Cataracts, Chronic Obstructive Weeks Of Treatment: 2 History: Pulmonary Disease (COPD), Sleep Clustered Wound: No Apnea, Hypertension, Osteoarthritis, Neuropathy Photos Wound Measurements Length: (cm) 0.9 Width: (cm) 1 Depth: (cm) 0.1 Area: (cm) 0.707 Volume: (cm) 0.071 % Reduction in Area: -652.1% % Reduction in Volume: -688.9% Epithelialization: None Tunneling: No Undermining:  No Wound Description Classification: Partial Thickness Wound Margin: Flat and Intact Exudate Amount: Large Exudate Type: Serous Exudate Color: amber Wound Bed Granulation Amount: None Present (0%) Exposed Structure Necrotic Amount: Large (67-100%) Fascia Exposed: No Necrotic Quality: Adherent Slough Fat Layer Exposed: No Tendon Exposed: No [place], Ia H. ([id number]) Muscle Exposed: No Joint Exposed: No Bone Exposed: No Limited to Skin Breakdown Periwound Skin Texture Texture Color No Abnormalities Noted: No No Abnormalities Noted: No Callus: No Atrophie Blanche: No Crepitus: No Cyanosis: No Excoriation: No Ecchymosis: No Fluctuance: No Erythema: No Friable: No Hemosiderin Staining: No Induration: No Mottled: No Localized Edema: Yes Pallor: No Rash: No Rubor: No Scarring: No Moisture No Abnormalities Noted: No Dry / Scaly: No Maceration: No Moist: No Wound Preparation Ulcer Cleansing: Rinsed/Irrigated with Saline Topical Anesthetic Applied: Other: lidocaine 4%, Treatment Notes Wound #4 (Left, Lateral Lower Leg)  1. Cleansed with: Clean wound with Normal Saline 2. Anesthetic Topical Lidocaine 4% cream to wound bed prior to debridement 4. Dressing Applied: Medihoney Gel 5. Secondary Dressing Applied Dry [place] Signature(s) Signed: 12/07/2015 4:38:29 PM By: Montey Hora Entered By: Montey Hora on 12/07/2015 13:01:41 [name] ([id number]) -------------------------------------------------------------------------------- Vitals Details Patient Name: [name] Date of Service: 12/07/2015 12:45 PM Medical Record Number: [id number] Patient Account Number: [id number] Date of Birth/Sex: 11-17-1950 (64 y.o. Female) Treating RN: Montey Hora Primary Care Physician: LADA, [place] Other Clinician: Referring Physician: LADA, [place] Treating Physician/Extender: Frann Rider in Treatment: 2 Vital Signs Time Taken:  12:51 Temperature (F): 98.4 Height (in): 63 Pulse (bpm): 77 Weight (lbs): 270 Respiratory Rate (breaths/min): 20 Body Mass Index (BMI): 47.8 Blood Pressure (mmHg): 132/63 Reference Range: 80 - 120 mg / dl Electronic Signature(s) Signed: 12/07/2015 4:38:29 PM By: Montey Hora Entered By: Montey Hora on 12/07/2015 12:52:36

## 2015-12-14 ENCOUNTER — Encounter (HOSPITAL_BASED_OUTPATIENT_CLINIC_OR_DEPARTMENT_OTHER): Payer: Medicare Other | Admitting: General Surgery

## 2015-12-14 DIAGNOSIS — L89892 Pressure ulcer of other site, stage 2: Secondary | ICD-10-CM | POA: Diagnosis not present

## 2015-12-14 DIAGNOSIS — I10 Essential (primary) hypertension: Secondary | ICD-10-CM | POA: Diagnosis not present

## 2015-12-14 DIAGNOSIS — G35 Multiple sclerosis: Secondary | ICD-10-CM | POA: Diagnosis not present

## 2015-12-14 DIAGNOSIS — I739 Peripheral vascular disease, unspecified: Secondary | ICD-10-CM | POA: Diagnosis not present

## 2015-12-14 DIAGNOSIS — L97222 Non-pressure chronic ulcer of left calf with fat layer exposed: Secondary | ICD-10-CM | POA: Diagnosis not present

## 2015-12-14 DIAGNOSIS — J449 Chronic obstructive pulmonary disease, unspecified: Secondary | ICD-10-CM | POA: Diagnosis not present

## 2015-12-14 DIAGNOSIS — G4733 Obstructive sleep apnea (adult) (pediatric): Secondary | ICD-10-CM | POA: Diagnosis not present

## 2015-12-14 DIAGNOSIS — Z79899 Other long term (current) drug therapy: Secondary | ICD-10-CM | POA: Diagnosis not present

## 2015-12-14 DIAGNOSIS — L97212 Non-pressure chronic ulcer of right calf with fat layer exposed: Secondary | ICD-10-CM | POA: Diagnosis not present

## 2015-12-14 DIAGNOSIS — L89222 Pressure ulcer of left hip, stage 2: Secondary | ICD-10-CM

## 2015-12-14 DIAGNOSIS — L98492 Non-pressure chronic ulcer of skin of other sites with fat layer exposed: Secondary | ICD-10-CM | POA: Diagnosis not present

## 2015-12-14 DIAGNOSIS — L97512 Non-pressure chronic ulcer of other part of right foot with fat layer exposed: Secondary | ICD-10-CM | POA: Diagnosis not present

## 2015-12-14 NOTE — Progress Notes (Signed)
See i[facility]al note 

## 2015-12-15 NOTE — Progress Notes (Addendum)
EVALISSE, FENDER ([id number]) Visit Report for 12/14/2015 Chief Complaint Document Details Patient Name: [name], [name] Date of Service: 12/14/2015 3:45 PM Medical Record Number: [id number] Patient Account Number: [id number] Date of Birth/Sex: 04/13/1951 (65 y.o. Female) Treating RN: Montey Hora Primary Care Physician: LADA, [place] Other Clinician: Referring Physician: LADA, [place] Treating Physician/Extender: Benjaman Pott in Treatment: 3 Information Obtained from: Patient Chief Complaint Patient seen for complaints of Non-Healing Wound to the left and right lower extremity and the right dorsum of the foot for about 6 months now Electronic Signature(s) Signed: 12/14/2015 4:05:24 PM By: Judene Companion MD Entered By: Judene Companion on 12/14/2015 16:05:23 [name] ([id number]) -------------------------------------------------------------------------------- Debridement Details Patient Name: [name] Date of Service: 12/14/2015 3:45 PM Medical Record Number: [id number] Patient Account Number: [id number] Date of Birth/Sex: [birth year] (64 y.o. Female) Treating RN: Carolyne Fiscal, Debi Primary Care Physician: LADA, [place] Other Clinician: Referring Physician: LADA, MELINDA Treating Physician/Extender: Benjaman Pott in Treatment: 3 Debridement Performed for Wound #3 Right Lower Leg Assessment: Performed By: Physician Judene Companion, MD Debridement: Debridement Pre-procedure Yes Verification/Time Out Taken: Start Time: 16:17 Pain Control: Other : lidocaine 4% cream Level: Skin/Subcutaneous Tissue Total Area Debrided (L x 1.3 (cm) x 1 (cm) = 1.3 (cm) W): Tissue and other Viable, Non-Viable, Exudate, Fibrin/Slough, Subcutaneous material debrided: Instrument: Curette Bleeding: Minimum Hemostasis Achieved: Pressure End Time: 16:19 Procedural Pain: 0 Post Procedural Pain: 0 Response to Treatment: Procedure was tolerated well Post Debridement Measurements of  Total Wound Length: (cm) 1.3 Width: (cm) 1 Depth: (cm) 0.3 Volume: (cm) 0.306 Post Procedure Diagnosis Same as Pre-procedure Electronic Signature(s) Signed: 12/14/2015 4:58:27 PM By: Alric Quan Signed: 12/15/2015 8:09:12 AM By: Judene Companion MD Entered By: Alric Quan on 12/14/2015 16:18:59 [name] ([id number]) -------------------------------------------------------------------------------- HPI Details Patient Name: [name] Date of Service: 12/14/2015 3:45 PM Medical Record Number: [id number] Patient Account Number: [id number] Date of Birth/Sex: 13-Sep-1950 (64 y.o. Female) Treating RN: Montey Hora Primary Care Physician: LADA, [place] Other Clinician: Referring Physician: LADA, MELINDA Treating Physician/Extender: Benjaman Pott in Treatment: 3 History of Present Illness Location: left and right lower extremity and dorsum of the right foot Quality: Patient reports experiencing a sharp pain to affected area(s). Severity: Patient states wound are getting worse. Duration: Patient has had the wound for > 6 months prior to seeking treatment at the wound center Timing: Pain in wound is Intermittent (comes and goes Context: The wound appeared gradually over time Modifying Factors: Other treatment(s) tried include: symptomatic treatment as advised by her PCP Associated Signs and Symptoms: Patient reports having increase swelling. HPI Description: 65 year old with a past medical history significant for MS, urinary incontinence, and obesity. She has been seen in the wound clinic before for lower extremity ulcerations treated with compression therapy. she is also known to have hypertension, peripheral vascular disease, COPD, obstructive sleep apnea, lumbar radiculopathy, kyphoscoliosis, urinary issues and tobacco abuse. [place] a packet of cigarettes a day was recently seen at the [facility] for swelling of her legs and feet with a ulceration  on the dorsum of the right foot which has been there for about 6 months. she was recently in the ER about a month ago where she was seen for shortness of breath and swelling of the legs and a chest x-ray was within normal limits had an increase in her BNP and was given Lasix and put on doxycycline for a mild cellulitis and possible UTI. Wounds aresmaller today 11/13/15. Debrided and will continue Santyl. Electronic  Signature(s) Signed: 12/14/2015 4:41:22 PM By: Judene Companion MD Entered By: Judene Companion on 12/14/2015 16:41:22 [name] ([id number]) -------------------------------------------------------------------------------- Physical Exam Details Patient Name: [name] Date of Service: 12/14/2015 3:45 PM Medical Record Number: [id number] Patient Account Number: [id number] Date of Birth/Sex: 06/20/1950 (64 y.o. Female) Treating RN: Montey Hora Primary Care Physician: LADA, [place] Other Clinician: Referring Physician: LADA, MELINDA Treating Physician/Extender: Judene Companion Weeks in Treatment: 3 Notes Improving. Continue Environmental health practitioner) Signed: 12/14/2015 4:42:18 PM By: Judene Companion MD Entered By: Judene Companion on 12/14/2015 16:42:17 [name] ([id number]) -------------------------------------------------------------------------------- Physician Orders Details Patient Name: [name] Date of Service: 12/14/2015 3:45 PM Medical Record Number: [id number] Patient Account Number: [id number] Date of Birth/Sex: 08/02/1950 (64 y.o. Female) Treating RN: Carolyne Fiscal, Debi Primary Care Physician: LADA, [place] Other Clinician: Referring Physician: LADA, MELINDA Treating Physician/Extender: Benjaman Pott in Treatment: 3 Verbal / Phone Orders: Yes Clinician: [place], Debi Read Back and Verified: Yes Diagnosis Coding ICD-10 Coding Code Description G35 Multiple sclerosis E66.01 Morbid (severe) obesity due to excess calories L97.512 Non-pressure  chronic ulcer of other part of right foot with fat layer exposed L97.212 Non-pressure chronic ulcer of right calf with fat layer exposed L97.222 Non-pressure chronic ulcer of left calf with fat layer exposed F17.218 Nicotine dependence, cigarettes, with other nicotine-induced disorders Wound Cleansing Wound #2 Right,Dorsal Foot o May Shower, gently pat wound dry prior to applying new dressing. Wound #3 Right Lower Leg o May Shower, gently pat wound dry prior to applying new dressing. Wound #4 Left,Lateral Lower Leg o May Shower, gently pat wound dry prior to applying new dressing. Anesthetic Wound #2 Right,Dorsal Foot o Topical Lidocaine 4% cream applied to wound bed prior to debridement - in clinic only Wound #3 Right Lower Leg o Topical Lidocaine 4% cream applied to wound bed prior to debridement - in clinic only Wound #4 Left,Lateral Lower Leg o Topical Lidocaine 4% cream applied to wound bed prior to debridement - in clinic only Skin Barriers/Peri-Wound Care Wound #2 Right,Dorsal Foot o Skin Prep Wound #3 Right Lower Leg o Skin Prep [name], [name]. ([id number]) Wound #4 Left,Lateral Lower Leg o Skin Prep Primary Wound Dressing Wound #2 Right,Dorsal Foot o Santyl Ointment - for today then go back to medihoney o Medihoney gel Wound #3 Right Lower Leg o Santyl Ointment - for today then go back to medihoney o Medihoney gel Wound #4 Left,Lateral Lower Leg o Santyl Ointment - for today then go back to [employer] o Medihoney gel Secondary Dressing Wound #2 Right,Dorsal Foot o Non-adherent pad - [place] Wound #3 Right Lower Leg o Non-adherent pad - [place] Wound #4 Left,Lateral Lower Leg o Non-adherent pad - [place] Dressing Change Frequency Wound #2 Right,Dorsal Foot o Change dressing every day. Wound #3 Right Lower Leg o Change dressing every day. Wound #4 Left,Lateral Lower Leg o Change dressing every  day. Follow-up Appointments Wound #2 Right,Dorsal Foot o Return Appointment in 1 week. Wound #3 Right Lower Leg o Return Appointment in 1 week. Wound #4 Left,Lateral Lower Leg o Return Appointment in 1 week. [name], [name] ([id number]) Edema Control Wound #2 Right,Dorsal Foot o Elevate legs to the level of the heart and pump ankles as often as possible Wound #3 Right Lower Leg o Elevate legs to the level of the heart and pump ankles as often as possible Wound #4 Left,Lateral Lower Leg o Elevate legs to the level of the heart and pump ankles as often as possible Additional  Orders / Instructions Wound #2 Right,Dorsal Foot o Stop Smoking o Increase protein intake. Wound #3 Right Lower Leg o Stop Smoking o Increase protein intake. Wound #4 Left,Lateral Lower Leg o Stop Smoking o Increase protein intake. Medications-please add to medication list. Wound #2 Right,Dorsal Foot o Other: - Medihoney/[employer] Wound #3 Right Lower Leg o Other: - Medihoney/[employer] Wound #4 Left,Lateral Lower Leg o Other: - Medihoney/[employer]uka [employer] Electronic Signature(s) Signed: 12/14/2015 4:58:27 PM By: Alric Quan Signed: 12/15/2015 8:09:12 AM By: Judene Companion MD Entered By: Alric Quan on 12/14/2015 16:23:00 [name] -------------------------------------------------------------------------------- Problem List Details Patient Name: [name] Date of Service: 12/14/2015 3:45 PM Medical Record Number: [id number] Patient Account Number: [id number] Date of Birth/Sex: 1950-12-07 (64 y.o. Female) Treating RN: Montey Hora Primary Care Physician: LADA, [place] Other Clinician: Referring Physician: LADA, MELINDA Treating Physician/Extender: Benjaman Pott in Treatment: 3 Active Problems ICD-10 Encounter Code Description Active Date Diagnosis G35 Multiple sclerosis 11/20/2015 Yes E66.01 Morbid (severe) obesity due to excess  calories 11/20/2015 Yes L97.512 Non-pressure chronic ulcer of other part of right foot with 11/20/2015 Yes fat layer exposed L97.212 Non-pressure chronic ulcer of right calf with fat layer 11/20/2015 Yes exposed L97.222 Non-pressure chronic ulcer of left calf with fat layer 11/20/2015 Yes exposed F17.218 Nicotine dependence, cigarettes, with other nicotine- 11/20/2015 Yes induced disorders Inactive Problems Resolved Problems Electronic Signature(s) Signed: 12/14/2015 4:05:09 PM By: Judene Companion MD Entered By: Judene Companion on 12/14/2015 16:05:09 [name] -------------------------------------------------------------------------------- Progress Note Details Patient Name: [name] Date of Service: 12/14/2015 3:45 PM Medical Record Number: [id number] Patient Account Number: [id number] Date of Birth/Sex: 1951-01-27 (64 y.o. Female) Treating RN: Montey Hora Primary Care Physician: LADA, [place] Other Clinician: Referring Physician: LADA, MELINDA Treating Physician/Extender: Benjaman Pott in Treatment: 3 Subjective Chief Complaint Information obtained from Patient Patient seen for complaints of Non-Healing Wound to the left and right lower extremity and the right dorsum of the foot for about 6 months now History of Present Illness (HPI) The following HPI elements were documented for the patient's wound: Location: left and right lower extremity and dorsum of the right foot Quality: Patient reports experiencing a sharp pain to affected area(s). Severity: Patient states wound are getting worse. Duration: Patient has had the wound for > 6 months prior to seeking treatment at the wound center Timing: Pain in wound is Intermittent (comes and goes Context: The wound appeared gradually over time Modifying Factors: Other treatment(s) tried include: symptomatic treatment as advised by her PCP Associated Signs and Symptoms: Patient reports having increase  swelling. 65 year old with a past medical history significant for MS, urinary incontinence, and obesity. She has been seen in the wound clinic before for lower extremity ulcerations treated with compression therapy. she is also known to have hypertension, peripheral vascular disease, COPD, obstructive sleep apnea, lumbar radiculopathy, kyphoscoliosis, urinary issues and tobacco abuse. [place] a packet of cigarettes a day was recently seen at the [facility] for swelling of her legs and feet with a ulceration on the dorsum of the right foot which has been there for about 6 months. she was recently in the ER about a month ago where she was seen for shortness of breath and swelling of the legs and a chest x-ray was within normal limits had an increase in her BNP and was given Lasix and put on doxycycline for a mild cellulitis and possible UTI. Wounds aresmaller today 11/13/15. Debrided and will continue Santyl. Objective Integumentary (Hair, Skin)  Wound #2 status is Open. Original cause of wound was Trauma. The wound is located on the Right,Dorsal Foot. The wound measures 0.6cm length x 1.3cm width x 0.2cm depth; 0.613cm^2 area and 0.123cm^3 volume. The wound is limited to skin breakdown. There is no tunneling or undermining noted. There is a large amount of serous drainage noted. The wound margin is flat and intact. There is small (1-33%) red [name], [name] H. ([id number]) granulation within the wound bed. There is a medium (34-66%) amount of necrotic tissue within the wound bed including Adherent Slough. The periwound skin appearance exhibited: Localized Edema, Ecchymosis. The periwound skin appearance did not exhibit: Callus, Crepitus, Excoriation, Fluctuance, Friable, Induration, Rash, Scarring, Dry/Scaly, Maceration, Moist, Atrophie Blanche, Cyanosis, Hemosiderin Staining, Mottled, Pallor, Rubor, Erythema. Wound #3 status is Open. Original cause of wound was Trauma. The wound is  located on the Right Lower Leg. The wound measures 1.3cm length x 1cm width x 0.2cm depth; 1.021cm^2 area and 0.204cm^3 volume. The wound is limited to skin breakdown. There is no tunneling or undermining noted. There is a large amount of serous drainage noted. The wound margin is flat and intact. There is small (1-33%) pink granulation within the wound bed. There is a large (67-100%) amount of necrotic tissue within the wound bed including Eschar and Adherent Slough. The periwound skin appearance exhibited: Localized Edema. The periwound skin appearance did not exhibit: Callus, Crepitus, Excoriation, Fluctuance, Friable, Induration, Rash, Scarring, Dry/Scaly, Maceration, Moist, Atrophie Blanche, Cyanosis, Ecchymosis, Hemosiderin Staining, Mottled, Pallor, Rubor, Erythema. Wound #4 status is Open. Original cause of wound was Trauma. The wound is located on the Left,Lateral Lower Leg. The wound measures 1.5cm length x 0.8cm width x 0.1cm depth; 0.942cm^2 area and 0.094cm^3 volume. The wound is limited to skin breakdown. There is no tunneling or undermining noted. There is a large amount of serous drainage noted. The wound margin is flat and intact. There is no granulation within the wound bed. There is a large (67-100%) amount of necrotic tissue within the wound bed including Adherent Slough. The periwound skin appearance exhibited: Localized Edema. The periwound skin appearance did not exhibit: Callus, Crepitus, Excoriation, Fluctuance, Friable, Induration, Rash, Scarring, Dry/Scaly, Maceration, Moist, Atrophie Blanche, Cyanosis, Ecchymosis, Hemosiderin Staining, Mottled, Pallor, Rubor, Erythema. Assessment Active Problems ICD-10 G35 - Multiple sclerosis E66.01 - Morbid (severe) obesity due to excess calories L97.512 - Non-pressure chronic ulcer of other part of right foot with fat layer exposed L97.212 - Non-pressure chronic ulcer of right calf with fat layer exposed L97.222 - Non-pressure  chronic ulcer of left calf with fat layer exposed F17.218 - Nicotine dependence, cigarettes, with other nicotine-induced disorders Procedures Wound #3 Wound #3 is a Trauma, Other located on the Right Lower Leg . There was a Skin/Subcutaneous Tissue [name], [name] H. ([id number]) Debridement [id number]) debridement with total area of 1.3 sq cm performed by Judene Companion, MD. with the following instrument(s): Curette to remove Viable and Non-Viable tissue/material including Exudate, Fibrin/Slough, and Subcutaneous after achieving pain control using Other (lidocaine 4% cream). A time out was conducted prior to the start of the procedure. A Minimum amount of bleeding was controlled with Pressure. The procedure was tolerated well with a pain level of 0 throughout and a pain level of 0 following the procedure. Post Debridement Measurements: 1.3cm length x 1cm width x 0.3cm depth; 0.306cm^3 volume. Post procedure Diagnosis Wound #3: Same as Pre-Procedure Plan Wound Cleansing: Wound #2 Right,Dorsal Foot: May Shower, gently pat wound dry prior to applying new dressing. Wound #3 Right  Lower Leg: May Shower, gently pat wound dry prior to applying new dressing. Wound #4 Left,Lateral Lower Leg: May Shower, gently pat wound dry prior to applying new dressing. Anesthetic: Wound #2 Right,Dorsal Foot: Topical Lidocaine 4% cream applied to wound bed prior to debridement - in clinic only Wound #3 Right Lower Leg: Topical Lidocaine 4% cream applied to wound bed prior to debridement - in clinic only Wound #4 Left,Lateral Lower Leg: Topical Lidocaine 4% cream applied to wound bed prior to debridement - in clinic only Skin Barriers/Peri-Wound Care: Wound #2 Right,Dorsal Foot: Skin Prep Wound #3 Right Lower Leg: Skin Prep Wound #4 Left,Lateral Lower Leg: Skin Prep Primary Wound Dressing: Wound #2 Right,Dorsal Foot: Santyl Ointment - for today then go back to medihoney Medihoney gel Wound #3 Right  Lower Leg: Santyl Ointment - for today then go back to medihoney Medihoney gel Wound #4 Left,Lateral Lower Leg: Santyl Ointment - for today then go back to [employer] gel Secondary Dressing: Wound #2 Right,Dorsal Foot: Non-adherent pad - [address] [name] H. ([id number]) Wound #3 Right Lower Leg: Non-adherent pad - [place] Wound #4 Left,Lateral Lower Leg: Non-adherent pad - [place] Dressing Change Frequency: Wound #2 Right,Dorsal Foot: Change dressing every day. Wound #3 Right Lower Leg: Change dressing every day. Wound #4 Left,Lateral Lower Leg: Change dressing every day. Follow-up Appointments: Wound #2 Right,Dorsal Foot: Return Appointment in 1 week. Wound #3 Right Lower Leg: Return Appointment in 1 week. Wound #4 Left,Lateral Lower Leg: Return Appointment in 1 week. Edema Control: Wound #2 Right,Dorsal Foot: Elevate legs to the level of the heart and pump ankles as often as possible Wound #3 Right Lower Leg: Elevate legs to the level of the heart and pump ankles as often as possible Wound #4 Left,Lateral Lower Leg: Elevate legs to the level of the heart and pump ankles as often as possible Additional Orders / Instructions: Wound #2 Right,Dorsal Foot: Stop Smoking Increase protein intake. Wound #3 Right Lower Leg: Stop Smoking Increase protein intake. Wound #4 Left,Lateral Lower Leg: Stop Smoking Increase protein intake. Medications-please add to medication list.: Wound #2 Right,Dorsal Foot: Other: - Medihoney/[employer] Wound #3 Right Lower Leg: Other: - Medihoney/[employer] [employer] Wound #4 Left,Lateral Lower Leg: Other: - Medihoney/[employer] Follow-Up Appointments: A follow-up appointment should be scheduled. Medication Reconciliation completed and provided to Patient/Care Provider. A Patient Clinical Summary of Care was provided to BW [name], [name] H. ([id number]) Debrided ulcers on anterior right and left legs.. Will continue  Santyl daily. Ulcers i cm each. Electronic Signature(s) Signed: 12/14/2015 4:44:44 PM By: Judene Companion MD Entered By: Judene Companion on 12/14/2015 16:44:44 [name] ([id number]) -------------------------------------------------------------------------------- SuperBill Details Patient Name: [name] Date of Service: 12/14/2015 Medical Record Number: [id number] Patient Account Number: [id number] Date of Birth/Sex: 1950-12-04 (64 y.o. 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 layer exposed L97.222 Non-pressure chronic ulcer of left calf with fat layer exposed F17.218 Nicotine dependence, cigarettes, with other nicotine-induced disorders Facility Procedures CPT4 Code: [id number] Description: [id number] - DEB SUBQ TISSUE 20 SQ CM/< ICD-10 Description Diagnosis E66.01 Morbid (severe) obesity due to excess calories Modifier: Quantity: 1 Physician Procedures CPT4 Code Description: [id number] -  WC PHYS LEVEL 3 - EST PT ICD-10 Description Diagnosis L97.512 Non-pressure chronic ulcer of other part of right fo L97.222 Non-pressure chronic ulcer of left calf with fat lay Modifier: ot with fat lay er exposed Quantity: 1 er exposed CPT4 Code Description: [id number] - WC PHYS SUBQ TISS 20 SQ CM ICD-10 Description Diagnosis E66.01 Morbid (severe) obesity due to excess calories Modifier: Quantity: 1 Electronic Signature(s) Signed: 12/14/2015 4:45:38 PM By: Judene Companion MD Entered By: Judene Companion on 12/14/2015 16:45:37

## 2015-12-15 NOTE — Progress Notes (Signed)
[name], [name] ([id number]) Visit Report for 12/14/2015 Arrival Information Details Patient Name: [name], [name] Date of Service: 12/14/2015 3:45 PM Medical Record Number: [id number] Patient Account Number: [id number] Date of Birth/Sex: [birth year] (65 y.o. Female) Treating RN: Ashok Cordia, Debi Primary Care Physician: LADA, MELINDA Other Clinician: Referring Physician: LADA, MELINDA Treating Physician/Extender: Elayne Snare in Treatment: 3 Visit Information History Since Last Visit All ordered tests and consults were completed: No Patient Arrived: Walker Added or deleted any medications: No Arrival Time: 16:03 Any new allergies or adverse reactions: No Accompanied By: self Had a fall or experienced change in No Transfer Assistance: EasyPivot activities of daily living that may affect Patient Lift risk of falls: Patient Identification Verified: Yes Signs or symptoms of abuse/neglect since last No Secondary Verification Process Yes visito Completed: Hospitalized since last visit: No Patient Requires Transmission- No Pain Present Now: No Based Precautions: Patient Has Alerts: Yes Electronic Signature(s) Signed: 12/14/2015 4:58:27 PM By: Alejandro Mulling Entered By: Alejandro Mulling on 12/14/2015 16:04:12 [name] ([id number]) -------------------------------------------------------------------------------- Encounter Discharge Information Details Patient Name: [name] Date of Service: 12/14/2015 3:45 PM Medical Record Number: [id number] Patient Account Number: [id number] Date of Birth/Sex: Sep 06, 1950 (64 y.o. Female) Treating RN: Ashok Cordia, Debi Primary Care Physician: LADA, MELINDA Other Clinician: Referring Physician: LADA, MELINDA Treating Physician/Extender: Elayne Snare in Treatment: 3 Encounter Discharge Information Items Discharge Pain Level: 0 Discharge Condition: Stable Ambulatory Status: Walker Discharge Destination: Home Transportation:  Other Accompanied By: self Schedule Follow-up Appointment: Yes Medication Reconciliation completed Yes and provided to Patient/Care Ameen Mostafa: Provided on Clinical Summary of Care: 12/14/2015 Form Type Recipient Paper Patient BW Electronic Signature(s) Signed: 12/14/2015 4:46:08 PM By: Ardath Sax MD Previous Signature: 12/14/2015 4:35:37 PM Version By: Gwenlyn Perking Entered By: Ardath Sax on 12/14/2015 16:46:07 [name] ([id number]) -------------------------------------------------------------------------------- Lower Extremity Assessment Details Patient Name: [name] Date of Service: 12/14/2015 3:45 PM Medical Record Number: [id number] Patient Account Number: [id number] Date of Birth/Sex: October 05, 1950 (64 y.o. Female) Treating RN: Ashok Cordia, Debi Primary Care Physician: LADA, MELINDA Other Clinician: Referring Physician: LADA, MELINDA Treating Physician/Extender: Ardath Sax Weeks in Treatment: 3 Vascular Assessment Pulses: Posterior Tibial Dorsalis Pedis Palpable: [Left:Yes] [Right:Yes] Extremity colors, hair growth, and conditions: Extremity Color: [Left:Red] [Right:Red] Temperature of Extremity: [Left:Warm] [Right:Warm] Capillary Refill: [Left:< 3 seconds] [Right:< 3 seconds] Electronic Signature(s) Signed: 12/14/2015 4:58:27 PM By: Alejandro Mulling Entered By: Alejandro Mulling on 12/14/2015 16:04:57 Jennings, Derwood Kaplan ([id number]) -------------------------------------------------------------------------------- Multi Wound Chart Details Patient Name: [name] Date of Service: 12/14/2015 3:45 PM Medical Record Number: [id number] Patient Account Number: [id number] Date of Birth/Sex: 12-23-1950 (64 y.o. Female) Treating RN: Ashok Cordia, Debi Primary Care Physician: LADA, MELINDA Other Clinician: Referring Physician: LADA, MELINDA Treating Physician/Extender: Elayne Snare in Treatment: 3 Photos: [2:No Photos] [3:No Photos] [4:No Photos] Wound  Location: [2:Right Foot - Dorsal] [3:Right Lower Leg] [4:Left Lower Leg - Lateral] Wounding Event: [2:Trauma] [3:Trauma] [4:Trauma] Primary Etiology: [2:Trauma, Other] [3:Trauma, Other] [4:Venous Leg Ulcer] Comorbid History: [2:Cataracts, Chronic Obstructive Pulmonary Disease (COPD), Sleep Disease (COPD), Sleep Disease (COPD), Sleep Apnea, Hypertension, Osteoarthritis, Neuropathy Osteoarthritis, Neuropathy Osteoarthritis, Neuropathy] [3:Cataracts, Chronic  Obstructive Pulmonary Apnea, Hypertension,] [4:Cataracts, Chronic Obstructive Pulmonary Apnea, Hypertension,] Date Acquired: [2:07/21/2015] [3:10/09/2015] [4:10/09/2015] Weeks of Treatment: [2:3] [3:3] [4:3] Wound Status: [2:Open] [3:Open] [4:Open] Measurements L x W x D 0.6x1.3x0.2 [3:1.3x1x0.2] [4:1.5x0.8x0.1] (cm) Area (cm) : [2:0.613] [3:1.021] [4:0.942] Volume (cm) : [2:0.123] [3:0.204] [4:0.094] % Reduction in Area: [2:-56.00%] [3:-106.30%] [4:-902.10%] % Reduction in Volume: -55.70% [3:-37.80%] [4:-944.40%] Classification: [2:Partial Thickness] [3:Partial Thickness] [4:Partial  Thickness] Exudate Amount: [2:Large] [3:Large] [4:Large] Exudate Type: [2:Serous] [3:Serous] [4:Serous] Exudate Color: [2:amber] [3:amber] [4:amber] Wound Margin: [2:Flat and Intact] [3:Flat and Intact] [4:Flat and Intact] Granulation Amount: [2:Small (1-33%)] [3:Small (1-33%)] [4:None Present (0%)] Granulation Quality: [2:Red] [3:Pink] [4:N/A] Necrotic Amount: [2:Medium (34-66%)] [3:Large (67-100%)] [4:Large (67-100%)] Necrotic Tissue: [2:Adherent Slough] [3:Eschar, Adherent Slough Adherent Slough] Exposed Structures: [2:Fascia: No Fat: No Tendon: No Muscle: No Joint: No Bone: No Limited to Skin Breakdown] [3:Fascia: No Fat: No Tendon: No Muscle: No Joint: No Bone: No Limited to Skin Breakdown] [4:Fascia: No Fat: No Tendon: No Muscle: No Joint: No Bone: No Limited to  Skin Breakdown] Epithelialization: [2:None] [3:None] [4:None] Periwound Skin Texture:  Edema: Yes [2:Excoriation: No] [3:Edema: Yes Excoriation: No] [4:Edema: Yes Excoriation: No] Induration: No Induration: No Induration: No Callus: No Callus: No Callus: No Crepitus: No Crepitus: No Crepitus: No Fluctuance: No Fluctuance: No Fluctuance: No Friable: No Friable: No Friable: No Rash: No Rash: No Rash: No Scarring: No Scarring: No Scarring: No Periwound Skin Maceration: No Maceration: No Maceration: No Moisture: Moist: No Moist: No Moist: No Dry/Scaly: No Dry/Scaly: No Dry/Scaly: No Periwound Skin Color: Ecchymosis: Yes Atrophie Blanche: No Atrophie Blanche: No Atrophie Blanche: No Cyanosis: No Cyanosis: No Cyanosis: No Ecchymosis: No Ecchymosis: No Erythema: No Erythema: No Erythema: No Hemosiderin Staining: No Hemosiderin Staining: No Hemosiderin Staining: No Mottled: No Mottled: No Mottled: No Pallor: No Pallor: No Pallor: No Rubor: No Rubor: No Rubor: No Tenderness on No No No Palpation: Wound Preparation: Ulcer Cleansing: Ulcer Cleansing: Ulcer Cleansing: Rinsed/Irrigated with Rinsed/Irrigated with Rinsed/Irrigated with Saline Saline Saline Topical Anesthetic Topical Anesthetic Topical Anesthetic Applied: Other: lidocaine Applied: Other: lidocaine Applied: Other: lidocaine 4% 4% 4% Treatment Notes Electronic Signature(s) Signed: 12/14/2015 4:58:27 PM By: Alric Quan Entered By: Alric Quan on 12/14/2015 16:13:45 [name] ([id number]) -------------------------------------------------------------------------------- Multi-Disciplinary Care Plan Details Patient Name: [name] Date of Service: 12/14/2015 3:45 PM Medical Record Number: [id number] Patient Account Number: [id number] Date of Birth/Sex: [birth year] (64 y.o. Female) Treating RN: Carolyne Fiscal, Debi Primary Care Physician: LADA, [place] Other Clinician: Referring Physician: LADA, [place] Treating Physician/Extender: Benjaman Pott in Treatment:  3 Active Inactive Abuse / Safety / Falls / Self Care Management Nursing Diagnoses: Impaired physical mobility Potential for falls Goals: Patient will remain injury free Date Initiated: 11/20/2015 Goal Status: Active Interventions: Assess fall risk on admission and as needed Notes: Nutrition Nursing Diagnoses: Imbalanced nutrition Goals: Patient/caregiver agrees to and verbalizes understanding of need to use nutritional supplements and/or vitamins as prescribed Date Initiated: 11/20/2015 Goal Status: Active Interventions: Provide education on nutrition Notes: Orientation to the Wound Care Program Nursing Diagnoses: Knowledge deficit related to the wound healing center program Goals: RONISHA, PINELA ([id number]) Patient/caregiver will verbalize understanding of the [facility] Date Initiated: 11/20/2015 Goal Status: Active Interventions: Provide education on orientation to the wound center Notes: Wound/Skin Impairment Nursing Diagnoses: Impaired tissue integrity Goals: Patient will demonstrate a reduced rate of smoking or cessation of smoking Date Initiated: 11/20/2015 Goal Status: Active Ulcer/skin breakdown will heal within 14 weeks Date Initiated: 11/20/2015 Goal Status: Active Interventions: Assess patient/caregiver ability to obtain necessary supplies Provide education on smoking Notes: Electronic Signature(s) Signed: 12/14/2015 4:58:27 PM By: Alric Quan Entered By: Alric Quan on 12/14/2015 16:13:37 [name] ([id number]) -------------------------------------------------------------------------------- Pain Assessment Details Patient Name: [name] Date of Service: 12/14/2015 3:45 PM Medical Record Number: [id number] Patient Account Number: [id number] Date of Birth/Sex: 10-06-1950 (64 y.o. Female) Treating RN: Ahmed Prima Primary Care Physician: LADA,  [place] Other Clinician: Referring Physician: LADA, [place] Treating  Physician/Extender: Judene Companion Weeks in Treatment: 3 Active Problems Location of Pain Severity and Description of Pain Patient Has Paino No Site Locations With Dressing Change: No Pain Management and Medication Current Pain Management: Electronic Signature(s) Signed: 12/14/2015 4:58:27 PM By: Alric Quan Entered By: Alric Quan on 12/14/2015 16:04:18 [name] ([id number]) -------------------------------------------------------------------------------- Patient/Caregiver Education Details Patient Name: [name] Date of Service: 12/14/2015 3:45 PM Medical Record Number: [id number] Patient Account Number: [id number] Date of Birth/Gender: 31-Jan-1951 (65 y.o. Female) Treating RN: Carolyne Fiscal, Debi Primary Care Physician: LADA, [place] Other Clinician: Referring Physician: LADA, MELINDA Treating Physician/Extender: Benjaman Pott in Treatment: 3 Education Assessment Education Provided To: Patient Education Topics Provided Wound/Skin Impairment: Handouts: Other: change dressing as ordered Methods: Demonstration, Explain/Verbal Responses: State content correctly Electronic Signature(s) Signed: 12/15/2015 8:09:12 AM By: Judene Companion MD Entered By: Judene Companion on 12/14/2015 16:46:15 [name] ([id number]) -------------------------------------------------------------------------------- Wound Assessment Details Patient Name: [name] Date of Service: 12/14/2015 3:45 PM Medical Record Number: [id number] Patient Account Number: [id number] Date of Birth/Sex: [birth year] (64 y.o. Female) Treating RN: Carolyne Fiscal, Debi Primary Care Physician: LADA, [place] Other Clinician: Referring Physician: LADA, MELINDA Treating Physician/Extender: Judene Companion Weeks in Treatment: 3 Wound Status Wound Number: 2 Primary Trauma, Other Etiology: Wound Location: Right Foot - Dorsal Wound Open Wounding Event: Trauma Status: Date Acquired: 07/21/2015 Comorbid  Cataracts, Chronic Obstructive Weeks Of Treatment: 3 History: Pulmonary Disease (COPD), Sleep Clustered Wound: No Apnea, Hypertension, Osteoarthritis, Neuropathy Photos Photo Uploaded By: Alric Quan on 12/14/2015 16:51:17 Wound Measurements Length: (cm) 0.6 Width: (cm) 1.3 Depth: (cm) 0.2 Area: (cm) 0.613 Volume: (cm) 0.123 % Reduction in Area: -56% % Reduction in Volume: -55.7% Epithelialization: None Tunneling: No Undermining: No Wound Description Classification: Partial Thickness Wound Margin: Flat and Intact Exudate Amount: Large Exudate Type: Serous Exudate Color: amber Wound Bed Granulation Amount: Small (1-33%) Exposed Structure Granulation Quality: Red Fascia Exposed: No Necrotic Amount: Medium (34-66%) Fat Layer Exposed: No Yontz, Odeth H. ([id number]) Necrotic Quality: Adherent Slough Tendon Exposed: No Muscle Exposed: No Joint Exposed: No Bone Exposed: No Limited to Skin Breakdown Periwound Skin Texture Texture Color No Abnormalities Noted: No No Abnormalities Noted: No Callus: No Atrophie Blanche: No Crepitus: No Cyanosis: No Excoriation: No Ecchymosis: Yes Fluctuance: No Erythema: No Friable: No Hemosiderin Staining: No Induration: No Mottled: No Localized Edema: Yes Pallor: No Rash: No Rubor: No Scarring: No Moisture No Abnormalities Noted: No Dry / Scaly: No Maceration: No Moist: No Wound Preparation Ulcer Cleansing: Rinsed/Irrigated with Saline Topical Anesthetic Applied: Other: lidocaine 4%, Treatment Notes Wound #2 (Right, Dorsal Foot) 1. Cleansed with: Clean wound with Normal Saline 2. Anesthetic Topical Lidocaine 4% cream to wound bed prior to debridement 3. Peri-wound Care: Skin Prep 4. Dressing Applied: Santyl Ointment 5. Secondary [place] Signature(s) Signed: 12/14/2015 4:58:27 PM By: Alric Quan Entered By: Alric Quan on 12/14/2015 16:10:14 [name]  ([id number]) -------------------------------------------------------------------------------- Wound Assessment Details Patient Name: [name] Date of Service: 12/14/2015 3:45 PM Medical Record Number: [id number] Patient Account Number: [id number] Date of Birth/Sex: Aug 15, 1950 (64 y.o. Female) Treating RN: Carolyne Fiscal, Debi Primary Care Physician: LADA, [place] Other Clinician: Referring Physician: LADA, MELINDA Treating Physician/Extender: Judene Companion Weeks in Treatment: 3 Wound Status Wound Number: 3 Primary Trauma, Other Etiology: Wound Location: Right Lower Leg Wound Open Wounding Event: Trauma Status: Date Acquired: 10/09/2015 Comorbid Cataracts, Chronic Obstructive Weeks Of Treatment: 3 History: Pulmonary Disease (COPD), Sleep Clustered Wound: No Apnea, Hypertension,  Osteoarthritis, Neuropathy Photos Photo Uploaded By: Alric Quan on 12/14/2015 16:51:49 Wound Measurements Length: (cm) 1.3 Width: (cm) 1 Depth: (cm) 0.2 Area: (cm) 1.021 Volume: (cm) 0.204 % Reduction in Area: -106.3% % Reduction in Volume: -37.8% Epithelialization: None Tunneling: No Undermining: No Wound Description Classification: Partial Thickness Foul Odor Aft Wound Margin: Flat and Intact Exudate Amount: Large Exudate Type: Serous Exudate Color: amber er Cleansing: No Wound Bed Granulation Amount: Small (1-33%) Exposed Structure Granulation Quality: Pink Fascia Exposed: No Necrotic Amount: Large (67-100%) Fat Layer Exposed: No Bones, Yessica H. ([id number]) Necrotic Quality: Eschar, Adherent Slough Tendon Exposed: No Muscle Exposed: No Joint Exposed: No Bone Exposed: No Limited to Skin Breakdown Periwound Skin Texture Texture Color No Abnormalities Noted: No No Abnormalities Noted: No Callus: No Atrophie Blanche: No Crepitus: No Cyanosis: No Excoriation: No Ecchymosis: No Fluctuance: No Erythema: No Friable: No Hemosiderin Staining: No Induration: No Mottled:  No Localized Edema: Yes Pallor: No Rash: No Rubor: No Scarring: No Moisture No Abnormalities Noted: No Dry / Scaly: No Maceration: No Moist: No Wound Preparation Ulcer Cleansing: Rinsed/Irrigated with Saline Topical Anesthetic Applied: Other: lidocaine 4%, Treatment Notes Wound #3 (Right Lower Leg) 1. Cleansed with: Clean wound with Normal Saline 2. Anesthetic Topical Lidocaine 4% cream to wound bed prior to debridement 3. Peri-wound Care: Skin Prep 4. Dressing Applied: Santyl Ointment 5. Secondary [place] Signature(s) Signed: 12/14/2015 4:58:27 PM By: Alric Quan Entered By: Alric Quan on 12/14/2015 16:10:48 [name] ([id number]) -------------------------------------------------------------------------------- Wound Assessment Details Patient Name: [name] Date of Service: 12/14/2015 3:45 PM Medical Record Number: [id number] Patient Account Number: [id number] Date of Birth/Sex: 1950-05-07 (64 y.o. Female) Treating RN: Carolyne Fiscal, Debi Primary Care Physician: LADA, [place] Other Clinician: Referring Physician: LADA, MELINDA Treating Physician/Extender: Judene Companion Weeks in Treatment: 3 Wound Status Wound Number: 4 Primary Venous Leg Ulcer Etiology: Wound Location: Left Lower Leg - Lateral Wound Open Wounding Event: Trauma Status: Date Acquired: 10/09/2015 Comorbid Cataracts, Chronic Obstructive Weeks Of Treatment: 3 History: Pulmonary Disease (COPD), Sleep Clustered Wound: No Apnea, Hypertension, Osteoarthritis, Neuropathy Photos Photo Uploaded By: Alric Quan on 12/14/2015 16:51:50 Wound Measurements Length: (cm) 1.5 Width: (cm) 0.8 Depth: (cm) 0.1 Area: (cm) 0.942 Volume: (cm) 0.094 % Reduction in Area: -902.1% % Reduction in Volume: -944.4% Epithelialization: None Tunneling: No Undermining: No Wound Description Classification: Partial Thickness Wound Margin: Flat and Intact Exudate  Amount: Large Exudate Type: Serous Exudate Color: amber Wound Bed Granulation Amount: None Present (0%) Exposed Structure Necrotic Amount: Large (67-100%) Fascia Exposed: No Necrotic Quality: Adherent Slough Fat Layer Exposed: No Waszak, Alicea H. ([id number]) Tendon Exposed: No Muscle Exposed: No Joint Exposed: No Bone Exposed: No Limited to Skin Breakdown Periwound Skin Texture Texture Color No Abnormalities Noted: No No Abnormalities Noted: No Callus: No Atrophie Blanche: No Crepitus: No Cyanosis: No Excoriation: No Ecchymosis: No Fluctuance: No Erythema: No Friable: No Hemosiderin Staining: No Induration: No Mottled: No Localized Edema: Yes Pallor: No Rash: No Rubor: No Scarring: No Moisture No Abnormalities Noted: No Dry / Scaly: No Maceration: No Moist: No Wound Preparation Ulcer Cleansing: Rinsed/Irrigated with Saline Topical Anesthetic Applied: Other: lidocaine 4%, Treatment Notes Wound #4 (Left, Lateral Lower Leg) 1. Cleansed with: Clean wound with Normal Saline 2. Anesthetic Topical Lidocaine 4% cream to wound bed prior to debridement 3. Peri-wound Care: Skin Prep 4. Dressing Applied: Santyl Ointment 5. Secondary [place] Signature(s) Signed: 12/14/2015 4:58:27 PM By: Alric Quan Entered By: Alric Quan on 12/14/2015 16:12:06 Dorr, Donnarae H. (  [id number]) -------------------------------------------------------------------------------- Vitals Details Patient Name: [name] Date of Service: 12/14/2015 3:45 PM Medical Record Number: [id number] Patient Account Number: [id number] Date of Birth/Sex: 12/24/1950 (65 y.o. Female) Treating RN: Carolyne Fiscal, Debi Primary Care Physician: LADA, [place] Other Clinician: Referring Physician: LADA, [place] Treating Physician/Extender: Judene Companion Weeks in Treatment: 3 Vital Signs Time Taken: 16:09 Temperature (F): 98.2 Height (in): 63 Pulse (bpm):  74 Weight (lbs): 270 Respiratory Rate (breaths/min): 20 Body Mass Index (BMI): 47.8 Blood Pressure (mmHg): 134/64 Reference Range: 80 - 120 mg / dl Electronic Signature(s) Signed: 12/14/2015 4:58:27 PM By: Alric Quan Entered By: Alric Quan on 12/14/2015 16:52:29

## 2015-12-18 ENCOUNTER — Observation Stay
Admission: EM | Admit: 2015-12-18 | Discharge: 2015-12-19 | Disposition: A | Discharge status: Home / Self Care | Payer: Medicare Other | Attending: Internal Medicine | Admitting: Internal Medicine

## 2015-12-18 ENCOUNTER — Encounter: Payer: Self-pay | Admitting: Emergency Medicine

## 2015-12-18 DIAGNOSIS — W5501XA Bitten by cat, initial encounter: Secondary | ICD-10-CM | POA: Diagnosis not present

## 2015-12-18 DIAGNOSIS — L89512 Pressure ulcer of right ankle, stage 2: Secondary | ICD-10-CM | POA: Insufficient documentation

## 2015-12-18 DIAGNOSIS — I739 Peripheral vascular disease, unspecified: Secondary | ICD-10-CM | POA: Diagnosis not present

## 2015-12-18 DIAGNOSIS — F3341 Major depressive disorder, recurrent, in partial remission: Secondary | ICD-10-CM | POA: Diagnosis not present

## 2015-12-18 DIAGNOSIS — G43909 Migraine, unspecified, not intractable, without status migrainosus: Secondary | ICD-10-CM | POA: Diagnosis not present

## 2015-12-18 DIAGNOSIS — E871 Hypo-osmolality and hyponatremia: Secondary | ICD-10-CM | POA: Insufficient documentation

## 2015-12-18 DIAGNOSIS — M5416 Radiculopathy, lumbar region: Secondary | ICD-10-CM | POA: Diagnosis not present

## 2015-12-18 DIAGNOSIS — L89522 Pressure ulcer of left ankle, stage 2: Secondary | ICD-10-CM | POA: Insufficient documentation

## 2015-12-18 DIAGNOSIS — I10 Essential (primary) hypertension: Secondary | ICD-10-CM | POA: Diagnosis not present

## 2015-12-18 DIAGNOSIS — L8990 Pressure ulcer of unspecified site, unspecified stage: Secondary | ICD-10-CM | POA: Insufficient documentation

## 2015-12-18 DIAGNOSIS — J449 Chronic obstructive pulmonary disease, unspecified: Secondary | ICD-10-CM | POA: Insufficient documentation

## 2015-12-18 DIAGNOSIS — G834 Cauda equina syndrome: Secondary | ICD-10-CM | POA: Insufficient documentation

## 2015-12-18 DIAGNOSIS — N183 Chronic kidney disease, stage 3 (moderate): Secondary | ICD-10-CM | POA: Insufficient documentation

## 2015-12-18 DIAGNOSIS — Z23 Encounter for immunization: Secondary | ICD-10-CM | POA: Insufficient documentation

## 2015-12-18 DIAGNOSIS — N8184 Pelvic muscle wasting: Secondary | ICD-10-CM | POA: Insufficient documentation

## 2015-12-18 DIAGNOSIS — L03116 Cellulitis of left lower limb: Secondary | ICD-10-CM | POA: Diagnosis not present

## 2015-12-18 DIAGNOSIS — L0390 Cellulitis, unspecified: Secondary | ICD-10-CM | POA: Diagnosis present

## 2015-12-18 DIAGNOSIS — G35 Multiple sclerosis: Secondary | ICD-10-CM | POA: Diagnosis not present

## 2015-12-18 DIAGNOSIS — Z79899 Other long term (current) drug therapy: Secondary | ICD-10-CM | POA: Diagnosis not present

## 2015-12-18 DIAGNOSIS — N319 Neuromuscular dysfunction of bladder, unspecified: Secondary | ICD-10-CM | POA: Insufficient documentation

## 2015-12-18 DIAGNOSIS — G4733 Obstructive sleep apnea (adult) (pediatric): Secondary | ICD-10-CM | POA: Diagnosis not present

## 2015-12-18 DIAGNOSIS — I129 Hypertensive chronic kidney disease with stage 1 through stage 4 chronic kidney disease, or unspecified chronic kidney disease: Secondary | ICD-10-CM | POA: Insufficient documentation

## 2015-12-18 DIAGNOSIS — M419 Scoliosis, unspecified: Secondary | ICD-10-CM | POA: Diagnosis not present

## 2015-12-18 DIAGNOSIS — R6889 Other general symptoms and signs: Secondary | ICD-10-CM | POA: Diagnosis not present

## 2015-12-18 DIAGNOSIS — F419 Anxiety disorder, unspecified: Secondary | ICD-10-CM | POA: Insufficient documentation

## 2015-12-18 DIAGNOSIS — M869 Osteomyelitis, unspecified: Secondary | ICD-10-CM

## 2015-12-18 DIAGNOSIS — R339 Retention of urine, unspecified: Secondary | ICD-10-CM | POA: Insufficient documentation

## 2015-12-18 DIAGNOSIS — F17210 Nicotine dependence, cigarettes, uncomplicated: Secondary | ICD-10-CM | POA: Insufficient documentation

## 2015-12-18 LAB — URINALYSIS COMPLETE WITH MICROSCOPIC (ARMC ONLY)
Bacteria, UA: NONE SEEN
Bilirubin Urine: NEGATIVE
Glucose, UA: NEGATIVE mg/dL
Hgb urine dipstick: NEGATIVE
Ketones, ur: NEGATIVE mg/dL
Leukocytes, UA: NEGATIVE
Nitrite: NEGATIVE
Protein, ur: NEGATIVE mg/dL
RBC / HPF: NONE SEEN RBC/hpf (ref 0–5)
Specific Gravity, Urine: 1.009 (ref 1.005–1.030)
pH: 7 (ref 5.0–8.0)

## 2015-12-18 LAB — BASIC METABOLIC PANEL
Anion gap: 10 (ref 5–15)
BUN: 20 mg/dL (ref 6–20)
CO2: 24 mmol/L (ref 22–32)
Calcium: 9.6 mg/dL (ref 8.9–10.3)
Chloride: 104 mmol/L (ref 101–111)
Creatinine, Ser: 1.4 mg/dL — ABNORMAL HIGH (ref 0.44–1.00)
GFR calc Af Amer: 45 mL/min — ABNORMAL LOW (ref >60)
GFR calc non Af Amer: 39 mL/min — ABNORMAL LOW (ref >60)
Glucose, Bld: 96 mg/dL (ref 65–99)
Potassium: 3.7 mmol/L (ref 3.5–5.1)
Sodium: 138 mmol/L (ref 135–145)

## 2015-12-18 LAB — CBC
HCT: 37.4 % (ref 35.0–47.0)
Hemoglobin: 12.7 g/dL (ref 12.0–16.0)
MCH: 29.1 pg (ref 26.0–34.0)
MCHC: 34.1 g/dL (ref 32.0–36.0)
MCV: 85.4 fL (ref 80.0–100.0)
Platelets: 263 10*3/uL (ref 150–440)
RBC: 4.38 MIL/uL (ref 3.80–5.20)
RDW: 14.6 % — ABNORMAL HIGH (ref 11.5–14.5)
WBC: 7.3 10*3/uL (ref 3.6–11.0)

## 2015-12-18 MED ORDER — CLINDAMYCIN PHOSPHATE 600 MG/50ML IV SOLN
600.0000 mg | Freq: Three times a day (TID) | INTRAVENOUS | Status: DC
  Administered 2015-12-19 (×2): 600 mg via INTRAVENOUS
  Filled 2015-12-18 (×4): qty 50

## 2015-12-18 MED ORDER — METHYLPHENIDATE HCL 5 MG PO TABS
10.0000 mg | ORAL_TABLET | Freq: Two times a day (BID) | ORAL | Status: DC
  Administered 2015-12-19 (×2): 10 mg via ORAL
  Filled 2015-12-18 (×2): qty 2

## 2015-12-18 MED ORDER — OXYCODONE HCL 5 MG PO TABS
5.0000 mg | ORAL_TABLET | ORAL | Status: DC | PRN
  Administered 2015-12-19: 5 mg via ORAL
  Filled 2015-12-18: qty 1

## 2015-12-18 MED ORDER — QUETIAPINE FUMARATE ER 50 MG PO TB24
150.0000 mg | ORAL_TABLET | Freq: Every day | ORAL | Status: DC
  Administered 2015-12-19: 150 mg via ORAL
  Filled 2015-12-18: qty 3

## 2015-12-18 MED ORDER — CALCIUM CARBONATE-VITAMIN D 500-125 MG-UNIT PO TABS: 1.0000 | ORAL_TABLET | Freq: Every day | ORAL | Status: DC

## 2015-12-18 MED ORDER — CLINDAMYCIN PHOSPHATE 600 MG/50ML IV SOLN
600.0000 mg | Freq: Once | INTRAVENOUS | Status: AC
  Administered 2015-12-18: 600 mg via INTRAVENOUS
  Filled 2015-12-18: qty 50

## 2015-12-18 MED ORDER — DARIFENACIN HYDROBROMIDE ER 7.5 MG PO TB24
7.5000 mg | ORAL_TABLET | Freq: Every day | ORAL | Status: DC
  Administered 2015-12-19 (×2): 7.5 mg via ORAL
  Filled 2015-12-18 (×3): qty 1

## 2015-12-18 MED ORDER — POLYETHYLENE GLYCOL 3350 17 GM/SCOOP PO POWD
17.0000 g | Freq: Every day | ORAL | Status: DC
  Filled 2015-12-18: qty 255

## 2015-12-18 MED ORDER — CALCIUM CARBONATE-VITAMIN D 500-200 MG-UNIT PO TABS
1.0000 | ORAL_TABLET | Freq: Every day | ORAL | Status: DC
  Administered 2015-12-19: 1 via ORAL
  Filled 2015-12-18: qty 1

## 2015-12-18 MED ORDER — TOPIRAMATE 25 MG PO TABS
50.0000 mg | ORAL_TABLET | Freq: Two times a day (BID) | ORAL | Status: DC
  Administered 2015-12-19 (×2): 50 mg via ORAL
  Filled 2015-12-18 (×2): qty 2

## 2015-12-18 MED ORDER — PNEUMOCOCCAL VAC POLYVALENT 25 MCG/0.5ML IJ INJ
0.5000 mL | INJECTION | INTRAMUSCULAR | Status: AC
  Administered 2015-12-19: 0.5 mL via INTRAMUSCULAR
  Filled 2015-12-18: qty 0.5

## 2015-12-18 MED ORDER — HEPARIN SODIUM (PORCINE) 5000 UNIT/ML IJ SOLN
5000.0000 [IU] | Freq: Three times a day (TID) | INTRAMUSCULAR | Status: DC
  Administered 2015-12-19 (×2): 5000 [IU] via SUBCUTANEOUS
  Filled 2015-12-18 (×2): qty 1

## 2015-12-18 MED ORDER — ADULT MULTIVITAMIN W/MINERALS CH
1.0000 | ORAL_TABLET | Freq: Every day | ORAL | Status: DC
  Administered 2015-12-19: 1 via ORAL
  Filled 2015-12-18 (×2): qty 1

## 2015-12-18 MED ORDER — ONDANSETRON HCL 4 MG/2ML IJ SOLN: 4.0000 mg | Freq: Four times a day (QID) | INTRAMUSCULAR | Status: DC | PRN

## 2015-12-18 MED ORDER — TRIAMTERENE-HCTZ 37.5-25 MG PO CAPS
1.0000 | ORAL_CAPSULE | Freq: Every day | ORAL | Status: DC
  Administered 2015-12-19: 1 via ORAL
  Filled 2015-12-18 (×3): qty 1

## 2015-12-18 MED ORDER — BUPROPION HCL ER (XL) 300 MG PO TB24
300.0000 mg | ORAL_TABLET | Freq: Every day | ORAL | Status: DC
  Administered 2015-12-19: 300 mg via ORAL
  Filled 2015-12-18 (×3): qty 1

## 2015-12-18 MED ORDER — DULOXETINE HCL 30 MG PO CPEP
60.0000 mg | ORAL_CAPSULE | Freq: Every day | ORAL | Status: DC
  Administered 2015-12-19: 60 mg via ORAL
  Filled 2015-12-18: qty 2

## 2015-12-18 MED ORDER — ACETAMINOPHEN 325 MG PO TABS: 650.0000 mg | ORAL_TABLET | Freq: Four times a day (QID) | ORAL | Status: DC | PRN

## 2015-12-18 MED ORDER — ONDANSETRON HCL 4 MG PO TABS
4.0000 mg | ORAL_TABLET | Freq: Four times a day (QID) | ORAL | Status: DC | PRN
Start: 2015-12-18 — End: 2015-12-19

## 2015-12-18 MED ORDER — DALFAMPRIDINE ER 10 MG PO TB12: 10.0000 mg | ORAL_TABLET | Freq: Two times a day (BID) | ORAL | Status: DC

## 2015-12-18 MED ORDER — GABAPENTIN 600 MG PO TABS
600.0000 mg | ORAL_TABLET | Freq: Three times a day (TID) | ORAL | Status: DC
  Administered 2015-12-19 (×2): 600 mg via ORAL
  Filled 2015-12-18 (×4): qty 1

## 2015-12-18 MED ORDER — CYCLOBENZAPRINE HCL 10 MG PO TABS: 10.0000 mg | ORAL_TABLET | Freq: Three times a day (TID) | ORAL | Status: DC | PRN

## 2015-12-18 MED ORDER — ACETAMINOPHEN 650 MG RE SUPP: 650.0000 mg | Freq: Four times a day (QID) | RECTAL | Status: DC | PRN

## 2015-12-18 MED ORDER — CLOTRIMAZOLE 1 % EX CREA
1.0000 "application " | TOPICAL_CREAM | Freq: Two times a day (BID) | CUTANEOUS | Status: DC
  Administered 2015-12-19 (×2): 1 via TOPICAL
  Filled 2015-12-18: qty 15

## 2015-12-18 NOTE — ED Provider Notes (Signed)
[facility] Emergency Department Provider Note ____________________________________________   I have reviewed the triage vital signs and the triage nursing note.  HISTORY  Chief Complaint Urinary Retention   Historian Patient  HPI [name] is a 65 y.o. female with history of multiple sclerosis and typically indwelling Foley catheter due to urinary retention issues, presents because her catheter came out and now she is having lower abdominal fullness and feeling like she can not urinate. She has been dribbling some urine, but still feels like she has to go.  Pain is moderate. Nothing makes it worse or better.  She's also had increased swelling and skin redness to the left foot and lower extremity for about 24 hours now. No fevers. She's had chronic wound ulcers on both legs/feet.    Past Medical History:  Diagnosis Date  . Anxiety   . COPD (chronic obstructive pulmonary disease) ([place])   . Depression   . Hypertension   . Multiple sclerosis ([place])   . Neuromuscular disorder [facility])     Patient Active Problem List   Diagnosis Date Noted  . Skin ulcer ([place]) 11/08/2015  . Peripheral vascular disease of lower extremity with ulceration ([place]) 11/08/2015  . CKD (chronic kidney disease) stage 3, GFR 30-59 ml/min 11/08/2015  . Hyponatremia 11/08/2015  . Decubitus ulcer of left thigh, stage 2 04/18/2015  . Constipation due to pain medication 01/31/2015  . Obstructive sleep apnea of adult 01/13/2015  . Pelvic muscle wasting 01/13/2015  . Incomplete bladder emptying 01/13/2015  . Headache, migraine 10/24/2014  . 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 Current tobacco use 10/24/2014  . Lumbar radiculopathy, chronic 10/02/2013  . COPD with bronchial hyperresponsiveness ([place]) 10/02/2013  . Major depressive disorder, recurrent, in partial remission ([place]) 10/02/2013  . Hypertension goal BP (blood pressure) < 140/90 10/02/2013  . Multiple sclerosis ([place]) 10/02/2013  .  Absence of bladder continence 09/25/2012  . Acontractile bladder 02/12/2012  . Bladder retention 02/12/2012  . Narrowing of intervertebral disc space 11/26/2011  . Kyphoscoliosis and scoliosis 11/26/2011    Past Surgical History:  Procedure Laterality Date  . BACK SURGERY N/A 2002  . TONSILLECTOMY AND ADENOIDECTOMY    . TUBAL LIGATION      Prior to Admission medications   Medication Sig Start Date End Date Taking? Authorizing Provider  buPROPion (BUDEPRION XL) 300 MG 24 hr tablet Take 1 tablet by mouth daily. 07/12/13   Historical Provider, MD  Calcium Carbonate-Vitamin D (CALCIUM 500 + D) 500-125 MG-UNIT TABS Take 1 tablet by mouth daily.    Historical Provider, MD  clonazePAM (KLONOPIN) 1 MG tablet Take 1 tablet by mouth 2 (two) times daily as needed. 07/12/13   Historical Provider, MD  clotrimazole (LOTRIMIN) 1 % cream Apply 1 application topically 2 (two) times daily. 10/26/15   Drenda Freeze, MD  cyclobenzaprine (FLEXERIL) 10 MG tablet Take 1 tablet by mouth 2 (two) times daily as needed. 07/12/13   Historical Provider, MD  dalfampridine (AMPYRA) 10 MG TB12 Take 1 tablet by mouth 2 (two) times daily. 10/21/13   Historical Provider, MD  DULoxetine (CYMBALTA) 60 MG capsule Take 1 capsule by mouth daily. 07/26/13   Historical Provider, MD  furosemide (LASIX) 20 MG tablet Take 1 tablet (20 mg total) by mouth daily. 10/26/15 10/25/16  Drenda Freeze, MD  gabapentin (NEURONTIN) 600 MG tablet Take 1 tablet (600 mg total) by mouth 3 (three) times daily. 05/16/15   Bobetta Lime, MD  Interferon Beta-1a (AVONEX PEN) 30 MCG/0.5ML  AJKT Inject 0.5 mLs into the skin once a week. 07/05/13   Historical Provider, MD  methylphenidate (RITALIN) 10 MG tablet  01/23/15   Historical Provider, MD  Multiple Vitamin (MULTI-VITAMINS) TABS Take 1 tablet by mouth daily.    Historical Provider, MD  polyethylene glycol powder (GLYCOLAX/MIRALAX) powder Take 17 g by mouth daily. 01/31/15   Bobetta Lime, MD   potassium chloride (K-DUR) 10 MEQ tablet Take 1 tablet (10 mEq total) by mouth daily. 05/15/15   Bobetta Lime, MD  QUEtiapine Fumarate (SEROQUEL XR) 150 MG 24 hr tablet Take 1 tablet by mouth at bedtime. 07/24/13   Historical Provider, MD  solifenacin (VESICARE) 5 MG tablet Take 1 tablet (5 mg total) by mouth daily. 11/03/14   Steele Sizer, MD  topiramate (TOPAMAX) 50 MG tablet Take 1 tablet (50 mg total) by mouth 2 (two) times daily. 11/18/15   Arnetha Courser, MD  triamterene-hydrochlorothiazide (DYAZIDE) 37.5-25 MG capsule Take 1 each (1 capsule total) by mouth daily. 04/26/15   Bobetta Lime, MD    No Known Allergies  Family History  Problem Relation Age of Onset  . COPD Mother   . Diabetes Mother   . Alcohol abuse Father   . Kidney disease Father     Social History Social History  Substance Use Topics  . Smoking status: Current Some Day Smoker    Types: Cigarettes  . Smokeless tobacco: Not on file  . Alcohol use No    Review of Systems  Constitutional: Negative for fever. Eyes: Negative for visual changes. ENT: Negative for sore throat. Cardiovascular: Negative for chest pain. Respiratory: Negative for shortness of breath. Gastrointestinal: Negative for vomiting and diarrhea. Genitourinary:Positive for dysuria. Musculoskeletal: Negative for back pain. Skin: Negative for rash. Neurological: Negative for headache. 10 point Review of Systems otherwise negative ____________________________________________   PHYSICAL EXAM:  VITAL SIGNS: ED Triage Vitals  Enc Vitals Group     BP 12/18/15 1614 124/74     Pulse Rate 12/18/15 1614 63     Resp 12/18/15 1831 16     Temp 12/18/15 1614 98 F (36.7 C)     Temp Source 12/18/15 1614 Oral     SpO2 12/18/15 1614 98 %     Weight 12/18/15 1615 270 lb (122.5 kg)     Height 12/18/15 1615 5\' 3"  (1.6 m)     Head Circumference --      Peak Flow --      Pain Score 12/18/15 1616 5     Pain Loc --      Pain Edu? --      Excl.  in [place]? --      Constitutional: Alert and oriented. Well appearing and in no distress. HEENT   Head: Normocephalic and atraumatic.      Eyes: Conjunctivae are normal. PERRL. Normal extraocular movements.      Ears:         Nose: No congestion/rhinnorhea.   Mouth/Throat: Mucous membranes are moist.  Missing teeth.   Neck: No stridor. Cardiovascular/Chest: Normal rate, regular rhythm.  No murmurs, rubs, or gallops. Respiratory: Normal respiratory effort without tachypnea nor retractions. Breath sounds are clear and equal bilaterally. No wheezes/rales/rhonchi. Gastrointestinal: Soft. No distention, no guarding, no rebound. Mild lower abdominal tenderness. Wheeze.  Genitourinary/rectal:Deferred Musculoskeletal: Nontender with normal range of motion in all extremities. No joint effusions.  Skin skin findings consistent with vascular insufficiency bilateral lower extremity. Multiple ulcers on both feet. Left foot 3-4+ pitting edema with edema and redness/cellulitis  from the foot almost up to the knee on the left side. Neurologic:  Normal speech and language. No gross or focal neurologic deficits are appreciated. Skin:  Skin is warm, dry and intact. No rash noted. Psychiatric: Mood and affect are normal. Speech and behavior are normal. Patient exhibits appropriate insight and judgment.  ____________________________________________   EKG I, Lisa Roca, MD, the attending physician have personally viewed and interpreted all ECGs.  None ____________________________________________  LABS (pertinent positives/negatives)  Labs Reviewed  BASIC METABOLIC PANEL - Abnormal; Notable for the following:       Result Value   Creatinine, Ser 1.40 (*)    GFR calc non Af Amer 39 (*)    GFR calc Af Amer 45 (*)    All other components within normal limits  CBC - Abnormal; Notable for the following:    RDW 14.6 (*)    All other components within normal limits  URINALYSIS COMPLETEWITH  MICROSCOPIC ([facility] ONLY)    ____________________________________________  RADIOLOGY All Xrays were viewed by me. Imaging interpreted by Radiologist.  None __________________________________________  PROCEDURES  Procedure(s) performed: None  Critical Care performed: None  ____________________________________________   ED COURSE / ASSESSMENT AND PLAN  Pertinent labs & imaging results that were available during my care of the patient were reviewed by me and considered in my medical decision making (see chart for details).   [name] came here initially for urinary retention symptoms after Foley catheter came out, her bladder scan did show near 600 cc, and a new Foley catheter was placed for urinary retention. Urinalysis is pending.  She also has 24 hours of worsening redness and swelling of the left lower extremity which is consistent with cellulitis. She does not have evidence of sepsis as this point time, however given the significant progression of her short period of time, and potential of limb threatening infection, patient was given IV clindamycin, and will be admitted/observation in the hospital for treatment.    CONSULTATIONS:  Dr. Lavetta Nielsen, hospitalist for admission.   Patient / Family / Caregiver informed of clinical course, medical decision-making process, and agree with plan.     ___________________________________________   FINAL CLINICAL IMPRESSION(S) / ED DIAGNOSES   Final diagnoses:  Cellulitis of left lower leg  Urinary retention              Note: This dictation was prepared with Dragon dictation. Any transcriptional errors that result from this process are unintentional    Lisa Roca, MD 12/18/15 [phone]

## 2015-12-18 NOTE — ED Notes (Signed)
Transporting patient to room 222-2C

## 2015-12-18 NOTE — H&P (Signed)
[place] [facility] [place] NAME: [name]    MR#:  [id number]  DATE OF BIRTH:  10/08/1950   DATE OF ADMISSION:  12/18/2015  PRIMARY CARE PHYSICIAN: Enid Derry, MD   REQUESTING/REFERRING PHYSICIAN: lord  CHIEF COMPLAINT:   Chief Complaint  Patient presents with  . Urinary Retention    HISTORY OF PRESENT ILLNESS:  [name]  is a 65 y.o. female with a known history of Multiple sclerosis who is presenting after her Foley catheter fell out which caused urinary retention and abdominal pain. Catheter replaced in emergency department without any difficulty. However, patient was also stating 2-3 days worth of increased drainage from chronic leg wound with associated redness. Denies any fevers chills but does complain of generalized weakness.  PAST MEDICAL HISTORY:   Past Medical History:  Diagnosis Date  . Anxiety   . COPD (chronic obstructive pulmonary disease) ([place])   . Depression   . Hypertension   . Multiple sclerosis ([place])   . Neuromuscular disorder ([place])     PAST SURGICAL HISTORY:   Past Surgical History:  Procedure Laterality Date  . BACK SURGERY N/A 2002  . TONSILLECTOMY AND ADENOIDECTOMY    . TUBAL LIGATION      SOCIAL HISTORY:   Social History  Substance Use Topics  . Smoking status: Current Some Day Smoker    Types: Cigarettes  . Smokeless tobacco: Never Used  . Alcohol use No    FAMILY HISTORY:   Family History  Problem Relation Age of Onset  . COPD Mother   . Diabetes Mother   . Alcohol abuse Father   . Kidney disease Father     DRUG ALLERGIES:  No Known Allergies  REVIEW OF SYSTEMS:  REVIEW OF SYSTEMS:  CONSTITUTIONAL: Denies fevers, chills,Positive fatigue, weakness.  EYES: Denies blurred vision, double vision, or eye pain.  EARS, NOSE, THROAT: Denies tinnitus, ear pain, hearing loss.  RESPIRATORY: denies cough, shortness of breath, wheezing  CARDIOVASCULAR: Denies chest pain, palpitations,  edema.  GASTROINTESTINAL: Denies nausea, vomiting, diarrhea, abdominal pain.  GENITOURINARY: Denies dysuria, hematuria.  ENDOCRINE: Denies nocturia or thyroid problems. HEMATOLOGIC AND LYMPHATIC: Denies easy bruising or bleeding.  SKIN: Right leg redness as described above as well as chronic wounds denies other rash or lesions.  MUSCULOSKELETAL: Denies pain in neck, back, shoulder, knees, hips, or further arthritic symptoms.  NEUROLOGIC: Denies paralysis, paresthesias.  PSYCHIATRIC: Denies anxiety or depressive symptoms. Otherwise full review of systems performed by me is negative.   MEDICATIONS AT HOME:   Prior to Admission medications   Medication Sig Start Date End Date Taking? Authorizing Provider  buPROPion (BUDEPRION XL) 300 MG 24 hr tablet Take 1 tablet by mouth daily. 07/12/13   Historical Provider, MD  Calcium Carbonate-Vitamin D (CALCIUM 500 + D) 500-125 MG-UNIT TABS Take 1 tablet by mouth daily.    Historical Provider, MD  clonazePAM (KLONOPIN) 1 MG tablet Take 1 tablet by mouth 2 (two) times daily as needed. 07/12/13   Historical Provider, MD  clotrimazole (LOTRIMIN) 1 % cream Apply 1 application topically 2 (two) times daily. 10/26/15   Drenda Freeze, MD  cyclobenzaprine (FLEXERIL) 10 MG tablet Take 1 tablet by mouth 2 (two) times daily as needed. 07/12/13   Historical Provider, MD  dalfampridine (AMPYRA) 10 MG TB12 Take 1 tablet by mouth 2 (two) times daily. 10/21/13   Historical Provider, MD  DULoxetine (CYMBALTA) 60 MG capsule Take 1 capsule by mouth daily. 07/26/13  Historical Provider, MD  furosemide (LASIX) 20 MG tablet Take 1 tablet (20 mg total) by mouth daily. 10/26/15 10/25/16  Drenda Freeze, MD  gabapentin (NEURONTIN) 600 MG tablet Take 1 tablet (600 mg total) by mouth 3 (three) times daily. 05/16/15   Bobetta Lime, MD  Interferon Beta-1a (AVONEX PEN) 30 MCG/0.5ML AJKT Inject 0.5 mLs into the skin once a week. 07/05/13   Historical Provider, MD  methylphenidate  (RITALIN) 10 MG tablet  01/23/15   Historical Provider, MD  Multiple Vitamin (MULTI-VITAMINS) TABS Take 1 tablet by mouth daily.    Historical Provider, MD  polyethylene glycol powder (GLYCOLAX/MIRALAX) powder Take 17 g by mouth daily. 01/31/15   Bobetta Lime, MD  potassium chloride (K-DUR) 10 MEQ tablet Take 1 tablet (10 mEq total) by mouth daily. 05/15/15   Bobetta Lime, MD  QUEtiapine Fumarate (SEROQUEL XR) 150 MG 24 hr tablet Take 1 tablet by mouth at bedtime. 07/24/13   Historical Provider, MD  solifenacin (VESICARE) 5 MG tablet Take 1 tablet (5 mg total) by mouth daily. 11/03/14   Steele Sizer, MD  topiramate (TOPAMAX) 50 MG tablet Take 1 tablet (50 mg total) by mouth 2 (two) times daily. 11/18/15   Arnetha Courser, MD  triamterene-hydrochlorothiazide (DYAZIDE) 37.5-25 MG capsule Take 1 each (1 capsule total) by mouth daily. 04/26/15   Bobetta Lime, MD      VITAL SIGNS:  Blood pressure (!) 146/73, pulse 62, temperature 98 F (36.7 C), temperature source Oral, resp. rate 16, height 5\' 3"  (1.6 m), weight 122.5 kg (270 lb), SpO2 100 %.  PHYSICAL EXAMINATION:  VITAL SIGNS: Vitals:   12/18/15 1614 12/18/15 1831  BP: 124/74 (!) 146/73  Pulse: 63 62  Resp:  16  Temp: 98 F (36.7 C)    GENERAL:64 y.o.female currently in no acute distress.  HEAD: Normocephalic, atraumatic.  EYES: Pupils equal, round, reactive to light. Extraocular muscles intact. No scleral icterus.  MOUTH: Moist mucosal membrane. Dentition intact. No abscess noted.  EAR, NOSE, THROAT: Clear without exudates. No external lesions.  NECK: Supple. No thyromegaly. No nodules. No JVD.  PULMONARY: Clear to ascultation, without wheeze rails or rhonci. No use of accessory muscles, Good respiratory effort. good air entry bilaterally CHEST: Nontender to palpation.  CARDIOVASCULAR: S1 and S2. Regular rate and rhythm. No murmurs, rubs, or gallops. No edema. Pedal pulses 2+ bilaterally.  GASTROINTESTINAL: Soft, nontender,  nondistended. No masses. Positive bowel sounds. No hepatosplenomegaly.  MUSCULOSKELETAL: No swelling, clubbing, or edema. Range of motion full in all extremities.  NEUROLOGIC: Cranial nerves II through XII are intact. No gross focal neurological deficits. Sensation intact. Reflexes intact.  SKIN: bilateral leg ulceration, Right leg erythema up to knee with edema no rashes, or cyanosis. Skin warm and dry. Turgor intact.  PSYCHIATRIC: Mood, affect within normal limits. The patient is awake, alert and oriented x 3. Insight, judgment intact.    LABORATORY PANEL:   CBC  Recent Labs Lab 12/18/15 1624  WBC 7.3  HGB 12.7  HCT 37.4  PLT 263   ------------------------------------------------------------------------------------------------------------------  Chemistries   Recent Labs Lab 12/18/15 1624  NA 138  K 3.7  CL 104  CO2 24  GLUCOSE 96  BUN 20  CREATININE 1.40*  CALCIUM 9.6   ------------------------------------------------------------------------------------------------------------------  Cardiac Enzymes No results for input(s): TROPONINI in the last 168 hours. ------------------------------------------------------------------------------------------------------------------  RADIOLOGY:  No results found.  EKG:   Orders placed or performed during the hospital encounter of 10/26/15  . EKG 12-Lead  . EKG 12-Lead  IMPRESSION AND PLAN:   50 -year-old Caucasian female history of multiple sclerosis originally presenting with Foley catheter miss function however did have worsening redness on legs  1. Cellulitis left lower extremity: In setting of chronic wounds, clindamycin, wound therapy 2. Multiple sclerosis: Ritalin,ampyra 3. Neurogenic bladder: Chronic Foley, Vesicare 4. Essential hypertension: Dyazide    All the records are reviewed and case discussed with ED provider. Management plans discussed with the patient, family and they are in agreement.  CODE  STATUS: Full  TOTAL TIME TAKING CARE OF THIS PATIENT: 33 minutes.    Hower,  Karenann Cai.D on 12/18/2015 at 8:06 PM  Between 7am to 6pm - Pager - [phone]  After 6pm: House Pager: - [phone]  [facility]  Office  [phone]  CC: Primary care physician; Enid Derry, MD

## 2015-12-18 NOTE — ED Notes (Signed)
Pt sleeping comfortably at this time.  No distress noted.

## 2015-12-19 ENCOUNTER — Encounter: Payer: Self-pay | Admitting: Family Medicine

## 2015-12-19 ENCOUNTER — Observation Stay: Payer: Medicare Other

## 2015-12-19 ENCOUNTER — Telehealth: Payer: Self-pay | Admitting: Family Medicine

## 2015-12-19 DIAGNOSIS — L03116 Cellulitis of left lower limb: Secondary | ICD-10-CM | POA: Diagnosis not present

## 2015-12-19 DIAGNOSIS — N319 Neuromuscular dysfunction of bladder, unspecified: Secondary | ICD-10-CM | POA: Diagnosis not present

## 2015-12-19 DIAGNOSIS — G35 Multiple sclerosis: Secondary | ICD-10-CM | POA: Diagnosis not present

## 2015-12-19 DIAGNOSIS — E786 Lipoprotein deficiency: Secondary | ICD-10-CM | POA: Insufficient documentation

## 2015-12-19 DIAGNOSIS — I10 Essential (primary) hypertension: Secondary | ICD-10-CM | POA: Diagnosis not present

## 2015-12-19 DIAGNOSIS — S81801A Unspecified open wound, right lower leg, initial encounter: Secondary | ICD-10-CM | POA: Diagnosis not present

## 2015-12-19 DIAGNOSIS — L8990 Pressure ulcer of unspecified site, unspecified stage: Secondary | ICD-10-CM | POA: Insufficient documentation

## 2015-12-19 MED ORDER — CLOTRIMAZOLE 1 % EX CREA: 1.0000 "application " | TOPICAL_CREAM | Freq: Two times a day (BID) | CUTANEOUS | 0 refills | Status: DC | PRN

## 2015-12-19 MED ORDER — CLINDAMYCIN HCL 300 MG PO CAPS: 300.0000 mg | ORAL_CAPSULE | Freq: Three times a day (TID) | ORAL | 0 refills | Status: DC

## 2015-12-19 MED ORDER — COLLAGENASE 250 UNIT/GM EX OINT
TOPICAL_OINTMENT | Freq: Every day | CUTANEOUS | Status: DC
  Administered 2015-12-19: 13:00:00 via TOPICAL
  Filled 2015-12-19: qty 30

## 2015-12-19 MED ORDER — COLLAGENASE 250 UNIT/GM EX OINT: TOPICAL_OINTMENT | Freq: Every day | CUTANEOUS | 0 refills | Status: DC

## 2015-12-19 NOTE — Discharge Summary (Signed)
[facility] [place] NAME: Aman Nourse    MR#:  [id number]  [place]:  [birth year]  DATE OF ADMISSION:  12/18/2015 ADMITTING PHYSICIAN: Lytle Butte, MD  DATE OF DISCHARGE: 12/19/2015  PRIMARY CARE PHYSICIAN: Enid Derry, MD    ADMISSION DIAGNOSIS:  Urinary retention [R33.9] Cellulitis of left lower leg [id number]  DISCHARGE DIAGNOSIS:  Active Problems:   Cellulitis   Pressure ulcer   SECONDARY DIAGNOSIS:   Past Medical History:  Diagnosis Date  . Anxiety   . COPD (chronic obstructive pulmonary disease) ([place])   . Depression   . Hypertension   . Multiple sclerosis ([place])   . Neuromuscular disorder [facility])     HOSPITAL COURSE:   65 year old female with a history of MS who presented after her Foley was dislodged and found to have cellulitis of lower extremity  1. Left lower extremity cellulitis with chronic wound care. Cellulitis seems to have improved. Patient does have some wounds which are healing on the lower extremity is. She was evaluated by wound care during this hospital stay her. She will be followed up with wound care center as she has been at discharge. She will continue with clindamycin.  2. Multiple sclerosis: Ritalin,ampyra 3. Neurogenic bladder: Foley was replaced in the emergency room. 4. Essential hypertension: Continue Dyazide  DISCHARGE CONDITIONS AND DIET:   Stable for discharge on regular diet  CONSULTS OBTAINED:  Treatment Team:  Lytle Butte, MD  DRUG ALLERGIES:  No Known Allergies  DISCHARGE MEDICATIONS:   Current Discharge Medication List    START taking these medications   Details  clindamycin (CLEOCIN) 300 MG capsule Take 1 capsule (300 mg total) by mouth 3 (three) times daily. Qty: 30 capsule, Refills: 0    collagenase (SANTYL) ointment Apply topically daily. Qty: 15 g, Refills: 0      CONTINUE these medications which have CHANGED   Details  clotrimazole (LOTRIMIN) 1 % cream  Apply 1 application topically 2 (two) times daily as needed. Qty: 12 g, Refills: 0      CONTINUE these medications which have NOT CHANGED   Details  buPROPion (BUDEPRION XL) 300 MG 24 hr tablet Take 1 tablet by mouth daily.    clonazePAM (KLONOPIN) 1 MG tablet Take 1 tablet by mouth 2 (two) times daily as needed.    dalfampridine (AMPYRA) 10 MG TB12 Take 1 tablet by mouth 2 (two) times daily.    DULoxetine (CYMBALTA) 60 MG capsule Take 1 capsule by mouth daily.    furosemide (LASIX) 20 MG tablet Take 1 tablet (20 mg total) by mouth daily. Qty: 3 tablet, Refills: 0    gabapentin (NEURONTIN) 600 MG tablet Take 1 tablet (600 mg total) by mouth 3 (three) times daily. Qty: 270 tablet, Refills: 1    Interferon Beta-1a (AVONEX PEN) 30 MCG/0.5ML AJKT Inject 0.5 mLs into the skin once a week.    methylphenidate (RITALIN) 10 MG tablet Refills: 0    potassium chloride (K-DUR) 10 MEQ tablet Take 1 tablet (10 mEq total) by mouth daily. Qty: 90 tablet, Refills: 1    QUEtiapine Fumarate (SEROQUEL XR) 150 MG 24 hr tablet Take 1 tablet by mouth at bedtime.    solifenacin (VESICARE) 5 MG tablet Take 1 tablet (5 mg total) by mouth daily. Qty: 30 tablet, Refills: 3    tiZANidine (ZANAFLEX) 4 MG tablet Take 4 mg by mouth every 6 (six) hours as needed for muscle spasms.    topiramate (  TOPAMAX) 50 MG tablet Take 1 tablet (50 mg total) by mouth 2 (two) times daily. Qty: 180 tablet, Refills: 1    triamterene-hydrochlorothiazide (DYAZIDE) 37.5-25 MG capsule Take 1 each (1 capsule total) by mouth daily. Qty: 90 capsule, Refills: 1   Associated Diagnoses: Hypertension goal BP (blood pressure) < 140/90    Calcium Carbonate-Vitamin D (CALCIUM 500 + D) 500-125 MG-UNIT TABS Take 1 tablet by mouth daily.    Multiple Vitamin (MULTI-VITAMINS) TABS Take 1 tablet by mouth daily.    polyethylene glycol powder (GLYCOLAX/MIRALAX) powder Take 17 g by mouth daily. Qty: 850 g, Refills: 2   Associated Diagnoses:  Constipation due to pain medication      STOP taking these medications     cyclobenzaprine (FLEXERIL) 10 MG tablet               Today   CHIEF COMPLAINT:  Patient is doing well this morning. No complaints.   VITAL SIGNS:  Blood pressure (!) 110/45, pulse 65, temperature 98 F (36.7 C), temperature source Oral, resp. rate 15, height 5\' 3"  (1.6 m), weight 126.1 kg (277 lb 14.4 oz), SpO2 96 %.   REVIEW OF SYSTEMS:  Review of Systems  Constitutional: Negative.  Negative for chills, fever and malaise/fatigue.  HENT: Negative.  Negative for ear discharge, ear pain, hearing loss, nosebleeds and sore throat.   Eyes: Negative.  Negative for blurred vision and pain.  Respiratory: Negative.  Negative for cough, hemoptysis, shortness of breath and wheezing.   Cardiovascular: Negative.  Negative for chest pain, palpitations and leg swelling.  Gastrointestinal: Negative.  Negative for abdominal pain, blood in stool, diarrhea, nausea and vomiting.  Genitourinary: Negative.  Negative for dysuria.  Musculoskeletal: Negative.  Negative for back pain.  Skin: Negative.        Cellulitis improved. Chronic lower extremity wounds which are healing.  Neurological: Negative for dizziness, tremors, speech change, focal weakness, seizures and headaches.  Endo/Heme/Allergies: Negative.  Does not bruise/bleed easily.  Psychiatric/Behavioral: Negative.  Negative for depression, hallucinations and suicidal ideas.     PHYSICAL EXAMINATION:  GENERAL:  65 y.o.-year-old patient lying in the bed with no acute distress.  NECK:  Supple, no jugular venous distention. No thyroid enlargement, no tenderness.  LUNGS: Normal breath sounds bilaterally, no wheezing, rales,rhonchi  No use of accessory muscles of respiration.  CARDIOVASCULAR: S1, S2 normal. No murmurs, rubs, or gallops.  ABDOMEN: Soft, non-tender, non-distended. Bowel sounds present. No organomegaly or mass.  EXTREMITIES: No pedal edema,  cyanosis, or clubbing.  PSYCHIATRIC: The patient is alert and oriented x 3.  SKIN: Chronic wound lower external is which are not draining. Cellulitis seems to have improved.   DATA REVIEW:   CBC  Recent Labs Lab 12/18/15 1624  WBC 7.3  HGB 12.7  HCT 37.4  PLT 263    Chemistries   Recent Labs Lab 12/18/15 1624  NA 138  K 3.7  CL 104  CO2 24  GLUCOSE 96  BUN 20  CREATININE 1.40*  CALCIUM 9.6    Cardiac Enzymes No results for input(s): TROPONINI in the last 168 hours.  Microbiology Results  @MICRORSLT48 @  RADIOLOGY:  Dg Tibia/fibula Right  Result Date: 12/19/2015 CLINICAL DATA:  Nonhealing wounds of the right lower leg, best images possible in this uncooperative patient EXAM: RIGHT TIBIA AND FIBULA - 2 VIEW COMPARISON:  None. FINDINGS: The right tibia and fibula are in normal alignment. No fracture is seen. There is some degenerative change of the right knee with  loss of joint space medially and at the patella femoral articulation. No demineralization or erosion is seen. IMPRESSION: Negative. Electronically Signed   By: Ivar Drape M.D.   On: 12/19/2015 08:51      Management plans discussed with the patient and she is in agreement. Stable for discharge   Patient should follow up with pcp and wound center  CODE STATUS:     Code Status Orders        Start     Ordered   12/18/15 1957  Full code  Continuous     12/18/15 1956    Code Status History    Date Active Date Inactive Code Status Order ID Comments User Context   This patient has a current code status but no historical code status.      TOTAL TIME TAKING CARE OF THIS PATIENT: 35 minutes.    Note: This dictation was prepared with Dragon dictation along with smaller phrase technology. Any transcriptional errors that result from this process are unintentional.  Baani Bober M.D on 12/19/2015 at 12:14 PM  Between 7am to 6pm - Pager - [phone] After 6pm go to [email or web address] - password [email or web address]  [facility]  Office  [phone]  CC: Primary care physician; Enid Derry, MD

## 2015-12-19 NOTE — Consult Note (Signed)
[facility] Nurse wound consult note Reason for Consult: Chronic bilateral ulcers Wound type: Non-healing ulcers with cellulitis Pressure Ulcer POA: NA Measurement:  Right lateral ankle 1 x 1 x 0.2 cm Wound bed: 100% purulent yellow slough  Drainage (amount, consistency, odor) purulent serous drainage, thick, no odor Periwound: Red, warm, edema  Measurement:  Right forefoot 0.2 x 1 0.1 cm Wound bed: 100% purulent yellow slough  Drainage (amount, consistency, odor) purulent serous drainage, thick, no odor Periwound: Red, warm, edema  Measurement:  Left lateral ankle 1.5 x 1 x 0 cm, 1 x 1 x 0, 0.5 x 0.5 x 0 cm Wound bed: 100% purulent yellow slough  Drainage (amount, consistency, odor) purulent serous drainage, thick, no odor Periwound: Red, warm, edema    Dressing procedure/placement/frequency: Wash bilateral legs with soap and water.  Apply santyl to wound base of open ulcer on right lateral ankle, right forefoot, and left lateral ankle.  Cover with silicone bordered foam. Change daily.  Pt states she goes to a [facility] for wound care with Medihoney.  Medihoney is not formulary at [facility]  Pt poor historian.  She states that the ulcers started out as cat bites but then states she got them from hitting her legs with walker.  Pt states they have been there for a long time.  Some healed areas on bilateral legs.  Pt states the open wounds have been there for at least 3 weeks.  At discharge pt should return to [facility] for care.  Discussed POC with patient and bedside nurse.  Re consult if needed, will not follow at this time. Thanks Dorna Bloom BSN, RN, [facility]

## 2015-12-19 NOTE — Telephone Encounter (Signed)
The patient scheduled for 8:20 am on Friday is a routine physical; please see if perhaps he wouldn't mind moving his appointment back another few weeks; if that doesn't work, back to me

## 2015-12-19 NOTE — Care Management Obs Status (Signed)
[facility] NOTIFICATION   Patient Details  Name: [name] MRN: [id number] Date of Birth: 09-01-1950   Medicare Observation Status Notification Given:  (S) No (admitted observation less than 24 hours)    Beverly Sessions, RN 12/19/2015, 12:15 PM

## 2015-12-19 NOTE — Progress Notes (Signed)
Alert and oriented. Vital signs stable . No signs of acute distress. Discharge instructions given. Patient verbalizes understanding. No other issues noted at this time.   
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
## 2015-12-20 NOTE — Telephone Encounter (Signed)
This patient does not have appointment for this Friday.

## 2015-12-20 NOTE — Telephone Encounter (Signed)
Left message for [name] to give [place] a call to schedule appt.

## 2015-12-20 NOTE — Telephone Encounter (Signed)
I would like for you to contact the patient who is scheduled at that time to see if they would be willing to give up that slot; I cannot type the patient's name in this patient's chart

## 2015-12-21 ENCOUNTER — Encounter: Payer: Medicare Other | Admitting: Surgery

## 2015-12-21 DIAGNOSIS — S81801A Unspecified open wound, right lower leg, initial encounter: Secondary | ICD-10-CM | POA: Diagnosis not present

## 2015-12-21 DIAGNOSIS — I10 Essential (primary) hypertension: Secondary | ICD-10-CM | POA: Diagnosis not present

## 2015-12-21 DIAGNOSIS — L97222 Non-pressure chronic ulcer of left calf with fat layer exposed: Secondary | ICD-10-CM | POA: Diagnosis not present

## 2015-12-21 DIAGNOSIS — Z79899 Other long term (current) drug therapy: Secondary | ICD-10-CM | POA: Diagnosis not present

## 2015-12-21 DIAGNOSIS — I739 Peripheral vascular disease, unspecified: Secondary | ICD-10-CM | POA: Diagnosis not present

## 2015-12-21 DIAGNOSIS — G35 Multiple sclerosis: Secondary | ICD-10-CM | POA: Diagnosis not present

## 2015-12-21 DIAGNOSIS — L97512 Non-pressure chronic ulcer of other part of right foot with fat layer exposed: Secondary | ICD-10-CM | POA: Diagnosis not present

## 2015-12-21 DIAGNOSIS — J449 Chronic obstructive pulmonary disease, unspecified: Secondary | ICD-10-CM | POA: Diagnosis not present

## 2015-12-21 DIAGNOSIS — G4733 Obstructive sleep apnea (adult) (pediatric): Secondary | ICD-10-CM | POA: Diagnosis not present

## 2015-12-21 DIAGNOSIS — L97212 Non-pressure chronic ulcer of right calf with fat layer exposed: Secondary | ICD-10-CM | POA: Diagnosis not present

## 2015-12-21 NOTE — Telephone Encounter (Signed)
Try 10:20 Friday morning. I have a 10:00 and a 10:40. The 10:00 appt shouldn't take 40 minutes. If you can't find another spot, just double book at end of morning or afternoon and I'll stay late. Thanks

## 2015-12-22 ENCOUNTER — Encounter: Payer: Self-pay | Admitting: Family Medicine

## 2015-12-22 ENCOUNTER — Ambulatory Visit (INDEPENDENT_AMBULATORY_CARE_PROVIDER_SITE_OTHER): Payer: Medicare Other | Admitting: Family Medicine

## 2015-12-22 VITALS — BP 130/70 | HR 73 | Temp 98.1°F | Resp 14 | Wt 275.0 lb

## 2015-12-22 DIAGNOSIS — Z792 Long term (current) use of antibiotics: Secondary | ICD-10-CM | POA: Diagnosis not present

## 2015-12-22 DIAGNOSIS — L97419 Non-pressure chronic ulcer of right heel and midfoot with unspecified severity: Secondary | ICD-10-CM | POA: Diagnosis not present

## 2015-12-22 DIAGNOSIS — R339 Retention of urine, unspecified: Secondary | ICD-10-CM | POA: Diagnosis not present

## 2015-12-22 DIAGNOSIS — I70232 Atherosclerosis of native arteries of right leg with ulceration of calf: Secondary | ICD-10-CM | POA: Diagnosis not present

## 2015-12-22 DIAGNOSIS — I739 Peripheral vascular disease, unspecified: Secondary | ICD-10-CM | POA: Diagnosis not present

## 2015-12-22 DIAGNOSIS — J441 Chronic obstructive pulmonary disease with (acute) exacerbation: Secondary | ICD-10-CM

## 2015-12-22 DIAGNOSIS — I70249 Atherosclerosis of native arteries of left leg with ulceration of unspecified site: Secondary | ICD-10-CM | POA: Diagnosis not present

## 2015-12-22 DIAGNOSIS — I10 Essential (primary) hypertension: Secondary | ICD-10-CM | POA: Diagnosis not present

## 2015-12-22 DIAGNOSIS — G35 Multiple sclerosis: Secondary | ICD-10-CM | POA: Diagnosis not present

## 2015-12-22 DIAGNOSIS — J449 Chronic obstructive pulmonary disease, unspecified: Secondary | ICD-10-CM | POA: Diagnosis not present

## 2015-12-22 DIAGNOSIS — L03119 Cellulitis of unspecified part of limb: Secondary | ICD-10-CM

## 2015-12-22 DIAGNOSIS — L97219 Non-pressure chronic ulcer of right calf with unspecified severity: Secondary | ICD-10-CM | POA: Diagnosis not present

## 2015-12-22 DIAGNOSIS — L97909 Non-pressure chronic ulcer of unspecified part of unspecified lower leg with unspecified severity: Secondary | ICD-10-CM | POA: Diagnosis not present

## 2015-12-22 DIAGNOSIS — M5416 Radiculopathy, lumbar region: Secondary | ICD-10-CM

## 2015-12-22 DIAGNOSIS — I70234 Atherosclerosis of native arteries of right leg with ulceration of heel and midfoot: Secondary | ICD-10-CM | POA: Diagnosis not present

## 2015-12-22 DIAGNOSIS — L97929 Non-pressure chronic ulcer of unspecified part of left lower leg with unspecified severity: Secondary | ICD-10-CM | POA: Diagnosis not present

## 2015-12-22 DIAGNOSIS — L03116 Cellulitis of left lower limb: Secondary | ICD-10-CM | POA: Diagnosis not present

## 2015-12-22 MED ORDER — POTASSIUM CHLORIDE ER 10 MEQ PO TBCR: 10.0000 meq | EXTENDED_RELEASE_TABLET | Freq: Every day | ORAL | 1 refills | Status: DC

## 2015-12-22 MED ORDER — TIOTROPIUM BROMIDE MONOHYDRATE 18 MCG IN CAPS: 18.0000 ug | ORAL_CAPSULE | Freq: Every day | RESPIRATORY_TRACT | 12 refills | Status: DC

## 2015-12-22 NOTE — Progress Notes (Signed)
[name], [name] ([id number]) Visit Report for 12/21/2015 Arrival Information Details Patient Name: [name], [name] Date of Service: 12/21/2015 3:45 PM Medical Record Number: [id number] Patient Account Number: [id number] Date of Birth/Sex: 11-Dec-1950 (65 y.o. Female) Treating RN: Carolyne Fiscal, Debi Primary Care Physician: LADA, [place] Other Clinician: Referring Physician: LADA, [place] Treating Physician/Extender: Frann Rider in Treatment: 4 Visit Information History Since Last Visit All ordered tests and consults were completed: No Patient Arrived: Wheel Chair Added or deleted any medications: No Arrival Time: 16:10 Any new allergies or adverse reactions: No Accompanied By: self Had a fall or experienced change in No Transfer Assistance: EasyPivot activities of daily living that may affect Patient Lift risk of falls: Patient Identification Verified: Yes Signs or symptoms of abuse/neglect since last No Secondary Verification Process Yes visito Completed: Hospitalized since last visit: No Patient Requires Transmission- No Pain Present Now: No Based Precautions: Patient Has Alerts: Yes Electronic Signature(s) Signed: 12/21/2015 5:32:44 PM By: Alric Quan Entered By: Alric Quan on 12/21/2015 16:10:29 [name] ([id number]) -------------------------------------------------------------------------------- Clinic Level of Care Assessment Details Patient Name: [name] Date of Service: 12/21/2015 3:45 PM Medical Record Number: [id number] Patient Account Number: [id number] Date of Birth/Sex: May 07, 1950 (64 y.o. Female) Treating RN: Afful, RN, BSN, Velva Harman Primary Care Physician: LADA, [place] Other Clinician: Referring Physician: LADA, MELINDA Treating Physician/Extender: Frann Rider in Treatment: 4 Clinic Level of Care Assessment Items TOOL 4 Quantity Score []  - Use when only an EandM is performed on FOLLOW-UP visit 0 ASSESSMENTS - Nursing  Assessment / Reassessment X - Reassessment of Co-morbidities (includes updates in patient status) 1 10 X - Reassessment of Adherence to Treatment Plan 1 5 ASSESSMENTS - Wound and Skin Assessment / Reassessment []  - Simple Wound Assessment / Reassessment - one wound 0 X - Complex Wound Assessment / Reassessment - multiple wounds 5 5 []  - Dermatologic / Skin Assessment (not related to wound area) 0 ASSESSMENTS - Focused Assessment []  - Circumferential Edema Measurements - multi extremities 0 []  - Nutritional Assessment / Counseling / Intervention 0 X - Lower Extremity Assessment (monofilament, tuning fork, pulses) 1 5 []  - Peripheral Arterial Disease Assessment (using hand held doppler) 0 ASSESSMENTS - Ostomy and/or Continence Assessment and Care []  - Incontinence Assessment and Management 0 []  - Ostomy Care Assessment and Management (repouching, etc.) 0 PROCESS - Coordination of Care X - Simple Patient / Family Education for ongoing care 1 15 []  - Complex (extensive) Patient / Family Education for ongoing care 0 []  - Staff obtains Programmer, systems, Records, Test Results / Process Orders 0 []  - Staff telephones HHA, Nursing Homes / Clarify orders / etc 0 []  - Routine Transfer to another Facility (non-emergent condition) 0 Bazar, Kmari H. ([id number]) []  - Routine Hospital Admission (non-emergent condition) 0 []  - New Admissions / Biomedical engineer / Ordering NPWT, Apligraf, etc. 0 []  - Emergency Hospital Admission (emergent condition) 0 []  - Simple Discharge Coordination 0 []  - Complex (extensive) Discharge Coordination 0 PROCESS - Special Needs []  - Pediatric / Minor Patient Management 0 []  - Isolation Patient Management 0 []  - Hearing / Language / Visual special needs 0 []  - Assessment of Community assistance (transportation, D/C planning, etc.) 0 []  - Additional assistance / Altered mentation 0 []  - Support Surface(s) Assessment (bed, cushion, seat, etc.) 0 INTERVENTIONS - Wound  Cleansing / Measurement []  - Simple Wound Cleansing - one wound 0 X - Complex Wound Cleansing - multiple wounds 5 5 []  - Wound Imaging (photographs - any number  of wounds) 0 []  - Wound Tracing (instead of photographs) 0 []  - Simple Wound Measurement - one wound 0 X - Complex Wound Measurement - multiple wounds 5 5 INTERVENTIONS - Wound Dressings X - Small Wound Dressing one or multiple wounds 5 10 []  - Medium Wound Dressing one or multiple wounds 0 []  - Large Wound Dressing one or multiple wounds 0 []  - Application of Medications - topical 0 []  - Application of Medications - injection 0 INTERVENTIONS - Miscellaneous []  - External ear exam 0 Converse, Dorthia H. ([id number]) []  - Specimen Collection (cultures, biopsies, blood, body fluids, etc.) 0 []  - Specimen(s) / Culture(s) sent or taken to Lab for analysis 0 []  - Patient Transfer (multiple staff / Harrel Lemon Lift / Similar devices) 0 []  - Simple Staple / Suture removal (25 or less) 0 []  - Complex Staple / Suture removal (26 or more) 0 []  - Hypo / Hyperglycemic Management (close monitor of Blood Glucose) 0 []  - Ankle / Brachial Index (ABI) - do not check if billed separately 0 X - Vital Signs 1 5 Has the patient been seen at the hospital within the last three years: Yes Total Score: 165 Level Of Care: New/Established - Level 5 Electronic Signature(s) Signed: 12/21/2015 5:25:53 PM By: Regan Lemming BSN, RN Entered By: Regan Lemming on 12/21/2015 16:44:49 [name] ([id number]) -------------------------------------------------------------------------------- Encounter Discharge Information Details Patient Name: [name] Date of Service: 12/21/2015 3:45 PM Medical Record Number: [id number] Patient Account Number: [id number] Date of Birth/Sex: 1950-07-06 (64 y.o. Female) Treating RN: Carolyne Fiscal, Debi Primary Care Physician: LADA, [place] Other Clinician: Referring Physician: LADA, [place] Treating Physician/Extender: Frann Rider  in Treatment: 4 Encounter Discharge Information Items Discharge Pain Level: 0 Discharge Condition: Stable Ambulatory Status: Wheelchair Discharge Destination: Home Transportation: Private Auto Accompanied By: niece Schedule Follow-up Appointment: Yes Medication Reconciliation completed and provided to Patient/Care Yes Wrangler Penning: Provided on Clinical Summary of Care: 12/21/2015 Form Type Recipient Paper Patient BW Electronic Signature(s) Signed: 12/21/2015 4:57:14 PM By: Ruthine Dose Entered By: Ruthine Dose on 12/21/2015 16:57:14 [name] ([id number]) -------------------------------------------------------------------------------- Lower Extremity Assessment Details Patient Name: [name] Date of Service: 12/21/2015 3:45 PM Medical Record Number: [id number] Patient Account Number: [id number] Date of Birth/Sex: 1951/04/19 (64 y.o. Female) Treating RN: Carolyne Fiscal, Debi Primary Care Physician: LADA, [place] Other Clinician: Referring Physician: LADA, MELINDA Treating Physician/Extender: Frann Rider in Treatment: 4 Vascular Assessment Pulses: Posterior Tibial Dorsalis Pedis Palpable: [Left:Yes] [Right:Yes] Extremity colors, hair growth, and conditions: Extremity Color: [Left:Red] [Right:Red] Temperature of Extremity: [Left:Warm] [Right:Warm] Capillary Refill: [Left:< 3 seconds] [Right:< 3 seconds] Electronic Signature(s) Signed: 12/21/2015 5:32:44 PM By: Alric Quan Entered By: Alric Quan on 12/21/2015 16:12:36 Beets, [name] ([id number]) -------------------------------------------------------------------------------- Multi Wound Chart Details Patient Name: [name] Date of Service: 12/21/2015 3:45 PM Medical Record Number: [id number] Patient Account Number: [id number] Date of Birth/Sex: 10/03/1950 (64 y.o. Female) Treating RN: Afful, RN, BSN, Velva Harman Primary Care Physician: LADA, Rip [place] Other Clinician: Referring Physician: LADA,  MELINDA Treating Physician/Extender: Frann Rider in Treatment: 4 Vital Signs Height(in): 63 Pulse(bpm): 100 Weight(lbs): 270 Blood Pressure 135/86 (mmHg): Body Mass Index(BMI): 48 Temperature(F): 98.7 Respiratory Rate 20 (breaths/min): Photos: [2:No Photos] [3:No Photos] [4:No Photos] Wound Location: [2:Right Foot - Dorsal] [3:Right Lower Leg] [4:Left Lower Leg - Lateral, Proximal] Wounding Event: [2:Trauma] [3:Trauma] [4:Trauma] Primary Etiology: [2:Trauma, Other] [3:Trauma, Other] [4:Venous Leg Ulcer] Comorbid History: [2:Cataracts, Chronic Obstructive Pulmonary Disease (COPD), Sleep Disease (COPD), Sleep Disease (COPD), Sleep Apnea, Hypertension, Osteoarthritis, Neuropathy Osteoarthritis, Neuropathy Osteoarthritis,  Neuropathy] [3:Cataracts, Chronic  Obstructive Pulmonary Apnea, Hypertension,] [4:Cataracts, Chronic Obstructive Pulmonary Apnea, Hypertension,] Date Acquired: [2:07/21/2015] [3:10/09/2015] [4:10/09/2015] Weeks of Treatment: [2:4] [3:4] [4:4] Wound Status: [2:Open] [3:Open] [4:Open] Measurements L x W x D 0.4x1.1x0.1 [3:0.6x0.6x0.2] [4:0.8x1.2x0.1] (cm) Area (cm) : [2:0.346] [3:0.283] [4:0.754] Volume (cm) : [2:0.035] [3:0.057] [4:0.075] % Reduction in Area: [2:12.00%] [3:42.80%] [4:-702.10%] % Reduction in Volume: 55.70% [3:61.50%] [4:-733.30%] Classification: [2:Partial Thickness] [3:Partial Thickness] [4:Partial Thickness] Exudate Amount: [2:Large] [3:Large] [4:Large] Exudate Type: [2:Serous] [3:Serous] [4:Serous] Exudate Color: [2:amber] [3:amber] [4:amber] Wound Margin: [2:Flat and Intact] [3:Flat and Intact] [4:Flat and Intact] Granulation Amount: [2:None Present (0%)] [3:None Present (0%)] [4:None Present (0%)] Necrotic Amount: [2:Large (67-100%)] [3:Large (67-100%)] [4:Large (67-100%)] Necrotic Tissue: [2:Adherent Slough] [3:Eschar] [4:Adherent Slough] Exposed Structures: [2:Fascia: No Fat: No Tendon: No] [3:Fascia: No Fat: No Tendon: No]  [4:Fascia: No Fat: No Tendon: No] Muscle: No Muscle: No Muscle: No Joint: No Joint: No Joint: No Bone: No Bone: No Bone: No Limited to Skin Limited to Skin Limited to Skin Breakdown Breakdown Breakdown Epithelialization: None None None Periwound Skin Texture: Edema: Yes Edema: Yes Edema: Yes Excoriation: No Excoriation: No Excoriation: No Induration: No Induration: No Induration: No Callus: No Callus: No Callus: No Crepitus: No Crepitus: No Crepitus: No Fluctuance: No Fluctuance: No Fluctuance: No Friable: No Friable: No Friable: No Rash: No Rash: No Rash: No Scarring: No Scarring: No Scarring: No Periwound Skin Maceration: No Maceration: No Maceration: No Moisture: Moist: No Moist: No Moist: No Dry/Scaly: No Dry/Scaly: No Dry/Scaly: No Periwound Skin Color: Ecchymosis: Yes Atrophie Blanche: No Atrophie Blanche: No Atrophie Blanche: No Cyanosis: No Cyanosis: No Cyanosis: No Ecchymosis: No Ecchymosis: No Erythema: No Erythema: No Erythema: No Hemosiderin Staining: No Hemosiderin Staining: No Hemosiderin Staining: No Mottled: No Mottled: No Mottled: No Pallor: No Pallor: No Pallor: No Rubor: No Rubor: No Rubor: No Erythema Location: N/A N/A N/A Temperature: N/A N/A N/A Tenderness on No No No Palpation: Wound Preparation: Ulcer Cleansing: Ulcer Cleansing: Ulcer Cleansing: Rinsed/Irrigated with Rinsed/Irrigated with Rinsed/Irrigated with Saline Saline Saline Topical Anesthetic Topical Anesthetic Topical Anesthetic Applied: Other: lidocaine Applied: Other: lidocaine Applied: Other: lidocaine 4% 4% 4% Wound Number: 5 6 N/A Photos: No Photos No Photos N/A Wound Location: Left Lower Leg - Lateral, Left Lower Leg - Anterior N/A Distal Wounding Event: Gradually Appeared Gradually Appeared N/A Primary Etiology: Venous Leg Ulcer Venous Leg Ulcer N/A Comorbid History: Cataracts, Chronic Cataracts, Chronic N/A Obstructive Pulmonary  Obstructive Pulmonary Disease (COPD), Sleep Disease (COPD), Sleep Apnea, Hypertension, Apnea, Hypertension, Osteoarthritis, Neuropathy Osteoarthritis, Neuropathy Date Acquired: 12/21/2015 12/21/2015 N/A [name], [name] ([id number]) Weeks of Treatment: 0 0 N/A Wound Status: Open Open N/A Measurements L x W x D 1.4x0.5x0.1 0.7x0.8x0.1 N/A (cm) Area (cm) : 0.55 0.44 N/A Volume (cm) : 0.055 0.044 N/A % Reduction in Area: N/A N/A N/A % Reduction in Volume: N/A N/A N/A Classification: Partial Thickness Partial Thickness N/A Exudate Amount: Large Large N/A Exudate Type: Serous Serous N/A Exudate Color: amber amber N/A Wound Margin: Distinct, outline attached Distinct, outline attached N/A Granulation Amount: None Present (0%) None Present (0%) N/A Necrotic Amount: Large (67-100%) Large (67-100%) N/A Necrotic Tissue: Adherent Slough Adherent Slough N/A Exposed Structures: Fascia: No Fascia: No N/A Fat: No Fat: No Tendon: No Tendon: No Muscle: No Muscle: No Joint: No Joint: No Bone: No Bone: No Limited to Skin Limited to Skin Breakdown Breakdown Epithelialization: None None N/A Periwound Skin Texture: No Abnormalities Noted Edema: No N/A Excoriation: No Induration: No Callus: No Crepitus: No Fluctuance: No Friable:  No Rash: No Scarring: No Periwound Skin Moist: Yes Moist: Yes N/A Moisture: Maceration: No Dry/Scaly: No Periwound Skin Color: Erythema: Yes Erythema: Yes N/A Atrophie Blanche: No Cyanosis: No Ecchymosis: No Hemosiderin Staining: No Mottled: No Pallor: No Rubor: No Erythema Location: Circumferential Circumferential N/A Temperature: No Abnormality No Abnormality N/A Tenderness on Yes Yes N/A Palpation: Wound Preparation: N/A [name], [name] ([id number]) Ulcer Cleansing: Ulcer Cleansing: Rinsed/Irrigated with Rinsed/Irrigated with Saline Saline Topical Anesthetic Topical Anesthetic Applied: Other: lidocaine Applied: Other: lidocaine 4%  4% Treatment Notes Electronic Signature(s) Signed: 12/21/2015 5:25:53 PM By: Regan Lemming BSN, RN Entered By: Regan Lemming on 12/21/2015 16:36:43 [name] ([id number]) -------------------------------------------------------------------------------- Multi-Disciplinary Care Plan Details Patient Name: [name] Date of Service: 12/21/2015 3:45 PM Medical Record Number: [id number] Patient Account Number: [id number] Date of Birth/Sex: [birth year] (64 y.o. Female) Treating RN: Afful, RN, BSN, Velva Harman Primary Care Physician: LADA, Rip [place] Other Clinician: Referring Physician: LADA, [place] Treating Physician/Extender: Frann Rider in Treatment: 4 Active Inactive Abuse / Safety / Falls / Self Care Management Nursing Diagnoses: Impaired physical mobility Potential for falls Goals: Patient will remain injury free Date Initiated: 11/20/2015 Goal Status: Active Interventions: Assess fall risk on admission and as needed Notes: Nutrition Nursing Diagnoses: Imbalanced nutrition Goals: Patient/caregiver agrees to and verbalizes understanding of need to use nutritional supplements and/or vitamins as prescribed Date Initiated: 11/20/2015 Goal Status: Active Interventions: Provide education on nutrition Notes: Orientation to the Wound Care Program Nursing Diagnoses: Knowledge deficit related to the wound healing center program Goals: [name], [name] ([id number]) Patient/caregiver will verbalize understanding of the [facility] Date Initiated: 11/20/2015 Goal Status: Active Interventions: Provide education on orientation to the wound center Notes: Wound/Skin Impairment Nursing Diagnoses: Impaired tissue integrity Goals: Patient will demonstrate a reduced rate of smoking or cessation of smoking Date Initiated: 11/20/2015 Goal Status: Active Ulcer/skin breakdown will heal within 14 weeks Date Initiated: 11/20/2015 Goal Status: Active Interventions: Assess  patient/caregiver ability to obtain necessary supplies Provide education on smoking Notes: Electronic Signature(s) Signed: 12/21/2015 5:25:53 PM By: Regan Lemming BSN, RN Entered By: Regan Lemming on 12/21/2015 16:36:03 [name] ([id number]) -------------------------------------------------------------------------------- Pain Assessment Details Patient Name: [name] Date of Service: 12/21/2015 3:45 PM Medical Record Number: [id number] Patient Account Number: [id number] Date of Birth/Sex: [birth year] (64 y.o. Female) Treating RN: Carolyne Fiscal, Debi Primary Care Physician: LADA, [place] Other Clinician: Referring Physician: LADA, [place] Treating Physician/Extender: Frann Rider in Treatment: 4 Active Problems Location of Pain Severity and Description of Pain Patient Has Paino No Site Locations With Dressing Change: No Pain Management and Medication Current Pain Management: Electronic Signature(s) Signed: 12/21/2015 5:32:44 PM By: Alric Quan Entered By: Alric Quan on 12/21/2015 16:10:35 [name] ([id number]) -------------------------------------------------------------------------------- Patient/Caregiver Education Details Patient Name: [name] Date of Service: 12/21/2015 3:45 PM Medical Record Number: [id number] Patient Account Number: [id number] Date of Birth/Gender: 13-Sep-1950 (65 y.o. Female) Treating RN: Carolyne Fiscal, Debi Primary Care Physician: LADA, [place] Other Clinician: Referring Physician: LADA, [place] Treating Physician/Extender: Frann Rider in Treatment: 4 Education Assessment Education Provided To: Patient Education Topics Provided Wound/Skin Impairment: Handouts: Other: change dressing as ordered Methods: Demonstration, Explain/Verbal Responses: State content correctly Electronic Signature(s) Signed: 12/21/2015 5:32:44 PM By: Alric Quan Entered By: Alric Quan on 12/21/2015 16:52:40 Adachi, [name]  ([id number]) -------------------------------------------------------------------------------- Wound Assessment Details Patient Name: [name] Date of Service: 12/21/2015 3:45 PM Medical Record Number: [id number] Patient Account Number: [id number] Date of Birth/Sex: 02-14-1951 (64 y.o. Female) Treating RN: Ahmed Prima Primary Care  Physician: LADA, Rip Harbour Other Clinician: Referring Physician: LADA, MELINDA Treating Physician/Extender: Frann Rider in Treatment: 4 Wound Status Wound Number: 2 Primary Trauma, Other Etiology: Wound Location: Right Foot - Dorsal Wound Open Wounding Event: Trauma Status: Date Acquired: 07/21/2015 Comorbid Cataracts, Chronic Obstructive Weeks Of Treatment: 4 History: Pulmonary Disease (COPD), Sleep Clustered Wound: No Apnea, Hypertension, Osteoarthritis, Neuropathy Wound Measurements Length: (cm) 0.4 Width: (cm) 1.1 Depth: (cm) 0.1 Area: (cm) 0.346 Volume: (cm) 0.035 % Reduction in Area: 12% % Reduction in Volume: 55.7% Epithelialization: None Tunneling: No Undermining: No Wound Description Classification: Partial Thickness Wound Margin: Flat and Intact Exudate Amount: Large Exudate Type: Serous Exudate Color: amber Wound Bed Granulation Amount: None Present (0%) Exposed Structure Necrotic Amount: Large (67-100%) Fascia Exposed: No Necrotic Quality: Adherent Slough Fat Layer Exposed: No Tendon Exposed: No Muscle Exposed: No Joint Exposed: No Bone Exposed: No Limited to Skin Breakdown Periwound Skin Texture Texture Color No Abnormalities Noted: No No Abnormalities Noted: No Callus: No Atrophie Blanche: No Crepitus: No Cyanosis: No Vondrak, Jessicca H. ([id number]) Excoriation: No Ecchymosis: Yes Fluctuance: No Erythema: No Friable: No Hemosiderin Staining: No Induration: No Mottled: No Localized Edema: Yes Pallor: No Rash: No Rubor: No Scarring: No Moisture No Abnormalities Noted: No Dry / Scaly:  No Maceration: No Moist: No Wound Preparation Ulcer Cleansing: Rinsed/Irrigated with Saline Topical Anesthetic Applied: Other: lidocaine 4%, Treatment Notes Wound #2 (Right, Dorsal Foot) 1. Cleansed with: Clean wound with Normal Saline 2. Anesthetic Topical Lidocaine 4% cream to wound bed prior to debridement 3. Peri-wound Care: Skin Prep 4. Dressing Applied: Santyl Ointment 5. Secondary Dressing Applied Bordered Foam Dressing Dry Gauze Electronic Signature(s) Signed: 12/21/2015 5:32:44 PM By: Alric Quan Entered By: Alric Quan on 12/21/2015 16:27:18 [name] ([id number]) -------------------------------------------------------------------------------- Wound Assessment Details Patient Name: [name] Date of Service: 12/21/2015 3:45 PM Medical Record Number: [id number] Patient Account Number: [id number] Date of Birth/Sex: 12/24/1950 (64 y.o. Female) Treating RN: Carolyne Fiscal, Debi Primary Care Physician: LADA, [place] Other Clinician: Referring Physician: LADA, MELINDA Treating Physician/Extender: Frann Rider in Treatment: 4 Wound Status Wound Number: 3 Primary Trauma, Other Etiology: Wound Location: Right Lower Leg Wound Open Wounding Event: Trauma Status: Date Acquired: 10/09/2015 Comorbid Cataracts, Chronic Obstructive Weeks Of Treatment: 4 History: Pulmonary Disease (COPD), Sleep Clustered Wound: No Apnea, Hypertension, Osteoarthritis, Neuropathy Wound Measurements Length: (cm) 0.6 Width: (cm) 0.6 Depth: (cm) 0.2 Area: (cm) 0.283 Volume: (cm) 0.057 % Reduction in Area: 42.8% % Reduction in Volume: 61.5% Epithelialization: None Tunneling: No Undermining: No Wound Description Classification: Partial Thickness Wound Margin: Flat and Intact Exudate Amount: Large Exudate Type: Serous Exudate Color: amber Foul Odor After Cleansing: No Wound Bed Granulation Amount: None Present (0%) Exposed Structure Necrotic Amount: Large  (67-100%) Fascia Exposed: No Necrotic Quality: Eschar Fat Layer Exposed: No Tendon Exposed: No Muscle Exposed: No Joint Exposed: No Bone Exposed: No Limited to Skin Breakdown Periwound Skin Texture Texture Color No Abnormalities Noted: No No Abnormalities Noted: No Callus: No Atrophie Blanche: No Crepitus: No Cyanosis: No [place]ura, Tiera H. ([id number]) Excoriation: No Ecchymosis: No Fluctuance: No Erythema: No Friable: No Hemosiderin Staining: No Induration: No Mottled: No Localized Edema: Yes Pallor: No Rash: No Rubor: No Scarring: No Moisture No Abnormalities Noted: No Dry / Scaly: No Maceration: No Moist: No Wound Preparation Ulcer Cleansing: Rinsed/Irrigated with Saline Topical Anesthetic Applied: Other: lidocaine 4%, Treatment Notes Wound #3 (Right Lower Leg) 1. Cleansed with: Clean wound with Normal Saline 2. Anesthetic Topical Lidocaine 4% cream to wound bed prior to debridement  3. Peri-wound Care: Skin Prep 4. Dressing Applied: Santyl Ointment 5. Secondary Dressing Applied Bordered Foam Dressing Dry Gauze Electronic Signature(s) Signed: 12/21/2015 5:32:44 PM By: Alric Quan Entered By: Alric Quan on 12/21/2015 16:26:38 [name] ([id number]) -------------------------------------------------------------------------------- Wound Assessment Details Patient Name: [name] Date of Service: 12/21/2015 3:45 PM Medical Record Number: [id number] Patient Account Number: [id number] Date of Birth/Sex: 12-Aug-1950 (64 y.o. Female) Treating RN: Carolyne Fiscal, Debi Primary Care Physician: LADA, [place] Other Clinician: Referring Physician: LADA, MELINDA Treating Physician/Extender: Frann Rider in Treatment: 4 Wound Status Wound Number: 4 Primary Venous Leg Ulcer Etiology: Wound Location: Left Lower Leg - Lateral, Proximal Wound Open Status: Wounding Event: Trauma Comorbid Cataracts, Chronic Obstructive Date Acquired:  10/09/2015 History: Pulmonary Disease (COPD), Sleep Weeks Of Treatment: 4 Apnea, Hypertension, Osteoarthritis, Clustered Wound: No Neuropathy Wound Measurements Length: (cm) 0.8 Width: (cm) 1.2 Depth: (cm) 0.1 Area: (cm) 0.754 Volume: (cm) 0.075 % Reduction in Area: -702.1% % Reduction in Volume: -733.3% Epithelialization: None Tunneling: No Undermining: No Wound Description Classification: Partial Thickness Wound Margin: Flat and Intact Exudate Amount: Large Exudate Type: Serous Exudate Color: amber Wound Bed Granulation Amount: None Present (0%) Exposed Structure Necrotic Amount: Large (67-100%) Fascia Exposed: No Necrotic Quality: Adherent Slough Fat Layer Exposed: No Tendon Exposed: No Muscle Exposed: No Joint Exposed: No Bone Exposed: No Limited to Skin Breakdown Periwound Skin Texture Texture Color No Abnormalities Noted: No No Abnormalities Noted: No Callus: No Atrophie Blanche: No Crepitus: No Cyanosis: No Sharman, Kaelyn H. ([id number]) Excoriation: No Ecchymosis: No Fluctuance: No Erythema: No Friable: No Hemosiderin Staining: No Induration: No Mottled: No Localized Edema: Yes Pallor: No Rash: No Rubor: No Scarring: No Moisture No Abnormalities Noted: No Dry / Scaly: No Maceration: No Moist: No Wound Preparation Ulcer Cleansing: Rinsed/Irrigated with Saline Topical Anesthetic Applied: Other: lidocaine 4%, Treatment Notes Wound #4 (Left, Proximal, Lateral Lower Leg) 1. Cleansed with: Clean wound with Normal Saline 2. Anesthetic Topical Lidocaine 4% cream to wound bed prior to debridement 3. Peri-wound Care: Skin Prep 4. Dressing Applied: Santyl Ointment 5. Secondary Dressing Applied Bordered Foam Dressing Dry Gauze Electronic Signature(s) Signed: 12/21/2015 5:32:44 PM By: Alric Quan Entered By: Alric Quan on 12/21/2015 16:23:38 [name]  ([id number]) -------------------------------------------------------------------------------- Wound Assessment Details Patient Name: [name] Date of Service: 12/21/2015 3:45 PM Medical Record Number: [id number] Patient Account Number: [id number] Date of Birth/Sex: 03-Dec-1950 (64 y.o. Female) Treating RN: Carolyne Fiscal, Debi Primary Care Physician: LADA, [place] Other Clinician: Referring Physician: LADA, MELINDA Treating Physician/Extender: Frann Rider in Treatment: 4 Wound Status Wound Number: 5 Primary Venous Leg Ulcer Etiology: Wound Location: Left Lower Leg - Lateral, Distal Wound Open Wounding Event: Gradually Appeared Status: Date Acquired: 12/21/2015 Comorbid Cataracts, Chronic Obstructive Weeks Of Treatment: 0 History: Pulmonary Disease (COPD), Sleep Clustered Wound: No Apnea, Hypertension, Osteoarthritis, Neuropathy Wound Measurements Length: (cm) 1.4 Width: (cm) 0.5 Depth: (cm) 0.1 Area: (cm) 0.55 Volume: (cm) 0.055 % Reduction in Area: % Reduction in Volume: Epithelialization: None Tunneling: No Undermining: No Wound Description Classification: Partial Thickness Wound Margin: Distinct, outline attached Exudate Amount: Large Exudate Type: Serous Exudate Color: amber Foul Odor After Cleansing: No Wound Bed Granulation Amount: None Present (0%) Exposed Structure Necrotic Amount: Large (67-100%) Fascia Exposed: No Necrotic Quality: Adherent Slough Fat Layer Exposed: No Tendon Exposed: No Muscle Exposed: No Joint Exposed: No Bone Exposed: No Limited to Skin Breakdown Periwound Skin Texture Texture Color No Abnormalities Noted: No No Abnormalities Noted: No Erythema: Yes Moisture Erythema Location: Circumferential No Abnormalities Noted: No  [name], [name] Moist: Yes Temperature / Pain Temperature: No Abnormality Tenderness on Palpation: Yes Wound Preparation Ulcer Cleansing: Rinsed/Irrigated with Saline Topical  Anesthetic Applied: Other: lidocaine 4%, Treatment Notes Wound #5 (Left, Distal, Lateral Lower Leg) 1. Cleansed with: Clean wound with Normal Saline 2. Anesthetic Topical Lidocaine 4% cream to wound bed prior to debridement 3. Peri-wound Care: Skin Prep 4. Dressing Applied: Santyl Ointment 5. Secondary Dressing Applied Bordered Foam Dressing Dry Gauze Electronic Signature(s) Signed: 12/21/2015 5:32:44 PM By: Alric Quan Entered By: Alric Quan on 12/21/2015 16:22:18 [name] -------------------------------------------------------------------------------- Wound Assessment Details Patient Name: [name] Date of Service: 12/21/2015 3:45 PM Medical Record Number: [id number] Patient Account Number: [id number] Date of Birth/Sex: Apr 14, 1951 (64 y.o. Female) Treating RN: Carolyne Fiscal, Debi Primary Care Physician: LADA, [place] Other Clinician: Referring Physician: LADA, MELINDA Treating Physician/Extender: Frann Rider in Treatment: 4 Wound Status Wound Number: 6 Primary Venous Leg Ulcer Etiology: Wound Location: Left Lower Leg - Anterior Wound Open Wounding Event: Gradually Appeared Status: Date Acquired: 12/21/2015 Comorbid Cataracts, Chronic Obstructive Weeks Of Treatment: 0 History: Pulmonary Disease (COPD), Sleep Clustered Wound: No Apnea, Hypertension, Osteoarthritis, Neuropathy Wound Measurements Length: (cm) 0.7 Width: (cm) 0.8 Depth: (cm) 0.1 Area: (cm) 0.44 Volume: (cm) 0.044 % Reduction in Area: % Reduction in Volume: Epithelialization: None Tunneling: No Undermining: No Wound Description Classification: Partial Thickness Wound Margin: Distinct, outline attached Exudate Amount: Large Exudate Type: Serous Exudate Color: amber Foul Odor After Cleansing: No Wound Bed Granulation Amount: None Present (0%) Exposed Structure Necrotic Amount: Large (67-100%) Fascia Exposed: No Necrotic Quality: Adherent Slough Fat  Layer Exposed: No Tendon Exposed: No Muscle Exposed: No Joint Exposed: No Bone Exposed: No Limited to Skin Breakdown Periwound Skin Texture Texture Color No Abnormalities Noted: No No Abnormalities Noted: No Callus: No Atrophie Blanche: No Crepitus: No Cyanosis: No Nuccio, Kyleen H. ([id number]) Excoriation: No Ecchymosis: No Fluctuance: No Erythema: Yes Friable: No Erythema Location: Circumferential Induration: No Hemosiderin Staining: No Localized Edema: No Mottled: No Rash: No Pallor: No Scarring: No Rubor: No Moisture Temperature / Pain No Abnormalities Noted: No Temperature: No Abnormality Dry / Scaly: No Tenderness on Palpation: Yes Maceration: No Moist: Yes Wound Preparation Ulcer Cleansing: Rinsed/Irrigated with Saline Topical Anesthetic Applied: Other: lidocaine 4%, Electronic Signature(s) Signed: 12/21/2015 5:32:44 PM By: Alric Quan Entered By: Alric Quan on 12/21/2015 16:25:58 [name] -------------------------------------------------------------------------------- Vitals Details Patient Name: [name] Date of Service: 12/21/2015 3:45 PM Medical Record Number: [id number] Patient Account Number: [id number] Date of Birth/Sex: [birth year] (64 y.o. Female) Treating RN: Carolyne Fiscal, Debi Primary Care Physician: LADA, [place] Other Clinician: Referring Physician: LADA, [place] Treating Physician/Extender: Frann Rider in Treatment: 4 Vital Signs Time Taken: 16:10 Temperature (F): 98.7 Height (in): 63 Pulse (bpm): 100 Weight (lbs): 270 Respiratory Rate (breaths/min): 20 Body Mass Index (BMI): 47.8 Blood Pressure (mmHg): 135/86 Reference Range: 80 - 120 mg / dl Electronic Signature(s) Signed: 12/21/2015 5:32:44 PM By: Alric Quan Entered By: Alric Quan on 12/21/2015 16:12:12

## 2015-12-22 NOTE — Patient Instructions (Addendum)
Try turmeric as a natural anti-inflammatory (for pain and arthritis). It comes in capsules where you buy aspirin and fish oil, but also as a spice where you buy pepper and garlic powder.  If you need something for aches or pains, try to use Tylenol (acetaminphen) instead of non-steroidals (which include Aleve, ibuprofen, Advil, Motrin, and naproxen); non-steroidals can cause long-term kidney damage  Try to increase your water intake, gradually work up to 64 ounces a day  Please do eat yogurt daily or take a probiotic daily for the next month or two We want to replace the healthy germs in the gut If you notice foul, watery diarrhea in the next two months, schedule an appointment RIGHT AWAY  Continue follow-up with wound care center  Start back on Spiriva  We'll see you back in September

## 2015-12-22 NOTE — Progress Notes (Signed)
BP 130/70   Pulse 73   Temp 98.1 F (36.7 C) (Oral)   Resp 14   Wt 275 lb (124.7 kg)   SpO2 96%   BMI 48.71 kg/m    Subjective:    Patient ID: [name], female    DOB: 24-Jul-1950, 65 y.o.   MRN: [id number]  HPI: [name] is a 65 y.o. female  Chief Complaint  Patient presents with  . Hospitalization Follow-up    cellulitis bilateral leg   Patient is here for hospital follow-up Discharge summary reviewed Admitted 12/18/15, discharged 12/19/15 Admission diagnoses: urinary retention, cellulitis left lower leg Discharge diagnoses: cellulitis, pressure ulcer She was evaluated by wound care during hospital stay, and was to continue clinda; Santyl was started For her MS, she takes ritalin and ampyra For her neurogenic bladder, Foley was replaced in the ER Essetial HTN, dyazide was continued  She presented to the ER with catheter issues and was kept overnight for cellulitis of the lower extremity; improved significantly and discharged the next day No fevers; no diarrhea No night sweats Just picked up clindamycin today and will start today Hospital labs reviewed: WBC 7.3k Creatinine 1.4 She continues to go to the wound clinic for the sores on her legs; they are dressed, and a home health nurse will be coming out to do the dressing changes    Depression screen [facility] 2/9 12/22/2015 11/08/2015 04/18/2015 01/13/2015 10/24/2014  Decreased Interest 3 1 1 1  0  Down, Depressed, Hopeless 3 3 2 1  0  PHQ - 2 Score 6 4 3 2  0  Altered sleeping 0 1 2 1  -  Tired, decreased energy 3 3 1 2  -  Change in appetite 0 3 0 0 -  Feeling bad or failure about yourself  3 2 0 0 -  Trouble concentrating 3 3 1  0 -  Moving slowly or fidgety/restless 0 0 0 1 -  Suicidal thoughts 0 0 0 - -  PHQ-9 Score 15 16 7 6  -  Difficult doing work/chores Very difficult Somewhat difficult - - -    Relevant past medical, surgical, family and social history reviewed Past Medical History:  Diagnosis Date  .  Anxiety   . COPD (chronic obstructive pulmonary disease) ([place])   . Depression   . Hypertension   . Kyphoscoliosis and scoliosis 11/26/2011  . Multiple sclerosis ([place])   . Neuromuscular disorder ([place])   . Peripheral vascular disease of lower extremity with ulceration ([place]) 11/08/2015  . Skin ulcer ([place]) 11/08/2015   Past Surgical History:  Procedure Laterality Date  . BACK SURGERY N/A 2002  . TONSILLECTOMY AND ADENOIDECTOMY    . TUBAL LIGATION     Social History  Substance Use Topics  . Smoking status: Current Some Day Smoker    Types: Cigarettes  . Smokeless tobacco: Never Used  . Alcohol use No   Interim medical history since last visit reviewed. Allergies and medications reviewed  Review of Systems Per HPI unless specifically indicated above     Objective:    BP 130/70   Pulse 73   Temp 98.1 F (36.7 C) (Oral)   Resp 14   Wt 275 lb (124.7 kg)   SpO2 96%   BMI 48.71 kg/m   Wt Readings from Last 3 Encounters:  12/22/15 275 lb (124.7 kg)  12/18/15 277 lb 14.4 oz (126.1 kg)  11/08/15 272 lb (123.4 kg)    Physical Exam  Constitutional: She appears well-developed and well-nourished.  Morbidly  obese  HENT:  Head: Normocephalic and atraumatic.  Abdominal: She exhibits no distension.  Soft and obese  Musculoskeletal: She exhibits no edema.  Neurological: She is alert.  Skin:  Ulcerations on lateral lower left and right legs; dressings pulled back, revealing ulcers 1-1.8 cm in size with fibrinous bases, no foul odor; no surrounding erythema, no red streaks proximally  Psychiatric: She has a normal mood and affect.  Good eye contact with examiner   Results for orders placed or performed during the hospital encounter of 12/18/15  Urinalysis complete, with microscopic- may I&O cath if menses  Result Value Ref Range   Color, Urine YELLOW (A) YELLOW   APPearance CLEAR (A) CLEAR   Glucose, UA NEGATIVE NEGATIVE mg/dL   Bilirubin Urine NEGATIVE NEGATIVE   Ketones, ur  NEGATIVE NEGATIVE mg/dL   Specific Gravity, Urine 1.009 1.005 - 1.030   Hgb urine dipstick NEGATIVE NEGATIVE   pH 7.0 5.0 - 8.0   Protein, ur NEGATIVE NEGATIVE mg/dL   Nitrite NEGATIVE NEGATIVE   Leukocytes, UA NEGATIVE NEGATIVE   RBC / HPF NONE SEEN 0 - 5 RBC/hpf   WBC, UA 0-5 0 - 5 WBC/hpf   Bacteria, UA NONE SEEN NONE SEEN   Squamous Epithelial / LPF 0-5 (A) NONE SEEN  Basic metabolic panel  Result Value Ref Range   Sodium 138 135 - 145 mmol/L   Potassium 3.7 3.5 - 5.1 mmol/L   Chloride 104 101 - 111 mmol/L   CO2 24 22 - 32 mmol/L   Glucose, Bld 96 65 - 99 mg/dL   BUN 20 6 - 20 mg/dL   Creatinine, Ser 1.40 (H) 0.44 - 1.00 mg/dL   Calcium 9.6 8.9 - 10.3 mg/dL   GFR calc non Af Amer 39 (L) >60 mL/min   GFR calc Af Amer 45 (L) >60 mL/min   Anion gap 10 5 - 15  CBC  Result Value Ref Range   WBC 7.3 3.6 - 11.0 K/uL   RBC 4.38 3.80 - 5.20 MIL/uL   Hemoglobin 12.7 12.0 - 16.0 g/dL   HCT 37.4 35.0 - 47.0 %   MCV 85.4 80.0 - 100.0 fL   MCH 29.1 26.0 - 34.0 pg   MCHC 34.1 32.0 - 36.0 g/dL   RDW 14.6 (H) 11.5 - 14.5 %   Platelets 263 150 - 440 K/uL      Assessment & Plan:   Problem List Items Addressed This Visit      Cardiovascular and Mediastinum   Peripheral vascular disease of lower extremity with ulceration (HCC) - Primary    Ulcers evaluated, all with fibrinous tissue at the bases; no foul odor, no evidence of infection; start antibiotics and f/u with wound care        Respiratory   COPD with bronchial hyperresponsiveness ([place])    Patient requested refill of spiriva; done      Relevant Medications   tiotropium (SPIRIVA) 18 MCG inhalation capsule     Nervous and Auditory   Multiple sclerosis ([place])    With neurogenic bladder; indwelling catheter; seeing urologist and neurologist      Lumbar radiculopathy, chronic    Avoid NSAIDs because of stage 3 CKD; use tylenol, turmeric        Genitourinary   Bladder retention    Indwelling Foley; followed by  urologist        Other   Cellulitis    Improved since hospitalization; antibiotics; caution about C diff  Other Visit Diagnoses   None.     Follow up plan: No Follow-up on file.  An after-visit summary was printed and given to the patient at [facility].  Please see the patient instructions which may contain other information and recommendations beyond what is mentioned above in the assessment and plan.  Meds ordered this encounter  Medications  . potassium chloride (K-DUR) 10 MEQ tablet    Sig: Take 1 tablet (10 mEq total) by mouth daily.    Dispense:  90 tablet    Refill:  1  . tiotropium (SPIRIVA) 18 MCG inhalation capsule    Sig: Place 1 capsule (18 mcg total) into inhaler and inhale daily.    Dispense:  30 capsule    Refill:  12    No orders of the defined types were placed in this encounter.

## 2015-12-22 NOTE — Telephone Encounter (Signed)
Patient will try to come in this afternoon at 4p

## 2015-12-24 ENCOUNTER — Encounter: Payer: Self-pay | Admitting: Family Medicine

## 2015-12-24 NOTE — Assessment & Plan Note (Signed)
Patient requested refill of spiriva; done

## 2015-12-24 NOTE — Assessment & Plan Note (Signed)
With neurogenic bladder; indwelling catheter; seeing urologist and neurologist

## 2015-12-24 NOTE — Assessment & Plan Note (Signed)
Avoid NSAIDs because of stage 3 CKD; use tylenol, turmeric

## 2015-12-24 NOTE — Assessment & Plan Note (Signed)
Ulcers evaluated, all with fibrinous tissue at the bases; no foul odor, no evidence of infection; start antibiotics and f/u with wound care

## 2015-12-24 NOTE — Assessment & Plan Note (Signed)
Improved since hospitalization; antibiotics; caution about C diff

## 2015-12-24 NOTE — Assessment & Plan Note (Signed)
Indwelling Foley; followed by urologist

## 2015-12-25 DIAGNOSIS — I70249 Atherosclerosis of native arteries of left leg with ulceration of unspecified site: Secondary | ICD-10-CM | POA: Diagnosis not present

## 2015-12-25 DIAGNOSIS — L97419 Non-pressure chronic ulcer of right heel and midfoot with unspecified severity: Secondary | ICD-10-CM | POA: Diagnosis not present

## 2015-12-25 DIAGNOSIS — G35 Multiple sclerosis: Secondary | ICD-10-CM | POA: Diagnosis not present

## 2015-12-25 DIAGNOSIS — L97219 Non-pressure chronic ulcer of right calf with unspecified severity: Secondary | ICD-10-CM | POA: Diagnosis not present

## 2015-12-25 DIAGNOSIS — Z792 Long term (current) use of antibiotics: Secondary | ICD-10-CM | POA: Diagnosis not present

## 2015-12-25 DIAGNOSIS — J449 Chronic obstructive pulmonary disease, unspecified: Secondary | ICD-10-CM | POA: Diagnosis not present

## 2015-12-25 DIAGNOSIS — L03116 Cellulitis of left lower limb: Secondary | ICD-10-CM | POA: Diagnosis not present

## 2015-12-25 DIAGNOSIS — I70234 Atherosclerosis of native arteries of right leg with ulceration of heel and midfoot: Secondary | ICD-10-CM | POA: Diagnosis not present

## 2015-12-25 DIAGNOSIS — R339 Retention of urine, unspecified: Secondary | ICD-10-CM | POA: Diagnosis not present

## 2015-12-25 DIAGNOSIS — L97929 Non-pressure chronic ulcer of unspecified part of left lower leg with unspecified severity: Secondary | ICD-10-CM | POA: Diagnosis not present

## 2015-12-25 DIAGNOSIS — I10 Essential (primary) hypertension: Secondary | ICD-10-CM | POA: Diagnosis not present

## 2015-12-25 DIAGNOSIS — I70232 Atherosclerosis of native arteries of right leg with ulceration of calf: Secondary | ICD-10-CM | POA: Diagnosis not present

## 2015-12-25 NOTE — Progress Notes (Signed)
[name], [name] Visit Report for 12/21/2015 Chief Complaint Document Details Patient Name: [name], [name] Date of Service: 12/21/2015 3:45 PM Medical Record Number: [id number] Patient Account Number: [id number] Date of Birth/Sex: Dec 22, 1950 (65 y.o. Female) Treating RN: Carolyne Fiscal, Debi Primary Care Physician: LADA, [place] Other Clinician: Referring Physician: LADA, [place] Treating Physician/Extender: Frann Rider in Treatment: 4 Information Obtained from: Patient Chief Complaint Patient seen for complaints of Non-Healing Wound to the left and right lower extremity and the right dorsum of the foot for about 6 months now Electronic Signature(s) Signed: 12/21/2015 4:40:44 PM By: Christin Fudge MD, FACS Entered By: Christin Fudge on 12/21/2015 16:40:44 [name] -------------------------------------------------------------------------------- HPI Details Patient Name: [name] Date of Service: 12/21/2015 3:45 PM Medical Record Number: [id number] Patient Account Number: [id number] Date of Birth/Sex: 05-21-1950 (64 y.o. Female) Treating RN: Carolyne Fiscal, Debi Primary Care Physician: LADA, [place] Other Clinician: Referring Physician: LADA, MELINDA Treating Physician/Extender: Frann Rider in Treatment: 4 History of Present Illness Location: left and right lower extremity and dorsum of the right foot Quality: Patient reports experiencing a sharp pain to affected area(s). Severity: Patient states wound are getting worse. Duration: Patient has had the wound for > 6 months prior to seeking treatment at the wound center Timing: Pain in wound is Intermittent (comes and goes Context: The wound appeared gradually over time Modifying Factors: Other treatment(s) tried include: symptomatic treatment as advised by her PCP Associated Signs and Symptoms: Patient reports having increase swelling. HPI Description: 65 year old with a past medical history  significant for MS, urinary incontinence, and obesity. She has been seen in the wound clinic before for lower extremity ulcerations treated with compression therapy. she is also known to have hypertension, peripheral vascular disease, COPD, obstructive sleep apnea, lumbar radiculopathy, kyphoscoliosis, urinary issues and tobacco abuse. [name] a packet of cigarettes a day was recently seen at the [facility] for swelling of her legs and feet with a ulceration on the dorsum of the right foot which has been there for about 6 months. she was recently in the ER about a month ago where she was seen for shortness of breath and swelling of the legs and a chest x-ray was within normal limits had an increase in her BNP and was given Lasix and put on doxycycline for a mild cellulitis and possible UTI. Wounds aresmaller today 11/13/15. Debrided and will continue Santyl. 12/21/2015 -- she was admitted to the hospital overnight on 12/18/2015 and diagnosed with urinary retention and cellulitis of the left lower leg. is past to take clindamycin and use Santyl for her wounds. Electronic Signature(s) Signed: 12/21/2015 4:44:23 PM By: Christin Fudge MD, FACS Entered By: Christin Fudge on 12/21/2015 16:44:23 [name] -------------------------------------------------------------------------------- Physical Exam Details Patient Name: [name] Date of Service: 12/21/2015 3:45 PM Medical Record Number: [id number] Patient Account Number: [id number] Date of Birth/Sex: 1951-04-25 (64 y.o. Female) Treating RN: Carolyne Fiscal, Debi Primary Care Physician: LADA, [place] Other Clinician: Referring Physician: LADA, MELINDA Treating Physician/Extender: Frann Rider in Treatment: 4 Constitutional . Pulse regular. Respirations normal and unlabored. Afebrile. . Eyes Nonicteric. Reactive to light. Ears, Nose, Mouth, and Throat Lips, teeth, and gums WNL.Marland Kitchen Moist mucosa without  lesions. Neck supple and nontender. No palpable supraclavicular or cervical adenopathy. Normal sized without goiter. Respiratory WNL. No retractions.. Breath sounds WNL, No rubs, rales, rhonchi, or wheeze.. Cardiovascular Heart rhythm and rate regular, no murmur or gallop.. Pedal Pulses WNL. No clubbing, cyanosis or edema. Chest Breasts symmetical and  no nipple discharge.. Breast tissue WNL, no masses, lumps, or tenderness.. Lymphatic No adneopathy. No adenopathy. No adenopathy. Musculoskeletal Adexa without tenderness or enlargement.. Digits and nails w/o clubbing, cyanosis, infection, petechiae, ischemia, or inflammatory conditions.. Integumentary (Hair, Skin) No suspicious lesions. No crepitus or fluctuance. No peri-wound warmth or erythema. No masses.Marland Kitchen Psychiatric Judgement and insight Intact.. No evidence of depression, anxiety, or agitation.. Notes the wounds on her legs bilaterally continue to have subcutaneous debris which was washed out with moist saline and is too tender to sharply dissected. Electronic Signature(s) Signed: 12/21/2015 4:44:52 PM By: Christin Fudge MD, FACS Entered By: Christin Fudge on 12/21/2015 16:44:52 [name] -------------------------------------------------------------------------------- Physician Orders Details Patient Name: [name] Date of Service: 12/21/2015 3:45 PM Medical Record Number: [id number] Patient Account Number: [id number] Date of Birth/Sex: May 11, 1950 (64 y.o. Female) Treating RN: Afful, RN, BSN, Velva Harman Primary Care Physician: LADA, Rip [place] Other Clinician: Referring Physician: LADA, MELINDA Treating Physician/Extender: Frann Rider in Treatment: 4 Verbal / Phone Orders: Yes Clinician: Afful, RN, BSN, Rita Read Back and Verified: Yes Diagnosis Coding Wound Cleansing Wound #2 Right,Dorsal Foot o May Shower, gently pat wound dry prior to applying new dressing. Wound #3 Right Lower Leg o May Shower,  gently pat wound dry prior to applying new dressing. Wound #4 Left,Proximal,Lateral Lower Leg o May Shower, gently pat wound dry prior to applying new dressing. Wound #5 Left,Distal,Lateral Lower Leg o May Shower, gently pat wound dry prior to applying new dressing. Wound #6 Left,Anterior Lower Leg o May Shower, gently pat wound dry prior to applying new dressing. Anesthetic Wound #2 Right,Dorsal Foot o Topical Lidocaine 4% cream applied to wound bed prior to debridement - in clinic only Wound #3 Right Lower Leg o Topical Lidocaine 4% cream applied to wound bed prior to debridement - in clinic only Wound #4 Left,Proximal,Lateral Lower Leg o Topical Lidocaine 4% cream applied to wound bed prior to debridement - in clinic only Wound #5 Left,Distal,Lateral Lower Leg o Topical Lidocaine 4% cream applied to wound bed prior to debridement - in clinic only Wound #6 Left,Anterior Lower Leg o Topical Lidocaine 4% cream applied to wound bed prior to debridement - in clinic only Skin Barriers/Peri-Wound Care Wound #2 Right,Dorsal Foot o Skin Prep Wound #3 Right Lower Leg Feldhaus, Amauri H. ([id number]) o Skin Prep Wound #4 Left,Proximal,Lateral Lower Leg o Skin Prep Wound #5 Left,Distal,Lateral Lower Leg o Skin Prep Wound #6 Left,Anterior Lower Leg o Skin Prep Primary Wound Dressing Wound #2 Right,Dorsal Foot o Santyl Ointment o Medihoney gel - When you run out of santyl Wound #5 Left,Distal,Lateral Lower Leg o Santyl Ointment o Medihoney gel - When you run out of santyl Wound #6 Left,Anterior Lower Leg o Santyl Ointment o Medihoney gel - When you run out of santyl Wound #3 Right Lower Leg o Santyl Ointment o Medihoney gel - When you run out of santyl Wound #4 Left,Proximal,Lateral Lower Leg o Santyl Ointment o Medihoney gel - When you run out of santyl Secondary Dressing Wound #2 Right,Dorsal Foot o Dry Gauze o Boardered Foam  Dressing Wound #3 Right Lower Leg o Dry Gauze o Boardered Foam Dressing Wound #4 Left,Proximal,Lateral Lower Leg o Dry Gauze o Boardered Foam Dressing Wound #5 Left,Distal,Lateral Lower Leg o Dry Gauze o Dry Gauze [place], Aleah H. ([id number]) o Boardered Foam Dressing o Boardered Foam Dressing Wound #6 Left,Anterior Lower Leg o Dry Gauze o Dry Gauze o Boardered Foam Dressing o Boardered Foam Dressing Dressing Change Frequency Wound #2  Right,Dorsal Foot o Change dressing every day. Wound #3 Right Lower Leg o Change dressing every day. Wound #4 Left,Proximal,Lateral Lower Leg o Change dressing every day. Wound #5 Left,Distal,Lateral Lower Leg o Change dressing every day. Wound #6 Left,Anterior Lower Leg o Change dressing every day. Follow-up Appointments Wound #2 Right,Dorsal Foot o Return Appointment in 1 week. Wound #3 Right Lower Leg o Return Appointment in 1 week. Wound #4 Left,Proximal,Lateral Lower Leg o Return Appointment in 1 week. Wound #5 Left,Distal,Lateral Lower Leg o Return Appointment in 1 week. Wound #6 Left,Anterior Lower Leg o Return Appointment in 1 week. Edema Control Wound #2 Right,Dorsal Foot o Elevate legs to the level of the heart and pump ankles as often as possible Wound #3 Right Lower Leg o Elevate legs to the level of the heart and pump ankles as often as possible Scherman, Solymar H. ([id number]) Wound #4 Left,Proximal,Lateral Lower Leg o Elevate legs to the level of the heart and pump ankles as often as possible Wound #5 Left,Distal,Lateral Lower Leg o Elevate legs to the level of the heart and pump ankles as often as possible Wound #6 Left,Anterior Lower Leg o Elevate legs to the level of the heart and pump ankles as often as possible Additional Orders / Instructions Wound #2 Right,Dorsal Foot o Stop Smoking o Increase protein intake. Wound #3 Right Lower Leg o Stop Smoking o  Increase protein intake. Wound #4 Left,Proximal,Lateral Lower Leg o Stop Smoking o Increase protein intake. Wound #5 Left,Distal,Lateral Lower Leg o Stop Smoking o Increase protein intake. Wound #6 Left,Anterior Lower Leg o Stop Smoking o Increase protein intake. Electronic Signature(s) Signed: 12/21/2015 5:32:44 PM By: Alric Quan Signed: 12/22/2015 7:49:58 AM By: Christin Fudge MD, FACS Previous Signature: 12/21/2015 4:50:05 PM Version By: Christin Fudge MD, FACS Previous Signature: 12/21/2015 4:46:57 PM Version By: Christin Fudge MD, FACS Entered By: Alric Quan on 12/21/2015 16:53:56 [name] -------------------------------------------------------------------------------- Problem List Details Patient Name: [name] Date of Service: 12/21/2015 3:45 PM Medical Record Number: [id number] Patient Account Number: [id number] Date of Birth/Sex: 02/10/1951 (65 y.o. 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 11/20/2015 Yes exposed L97.222 Non-pressure chronic ulcer of left calf with fat layer 11/20/2015 Yes exposed F17.218 Nicotine dependence, cigarettes, with other nicotine- 11/20/2015 Yes induced disorders Inactive Problems Resolved Problems Electronic Signature(s) Signed: 12/21/2015 4:40:28 PM By: Christin Fudge MD, FACS Entered By: Christin Fudge on 12/21/2015 16:40:28 [name]  ([id number]) -------------------------------------------------------------------------------- Progress Note Details Patient Name: [name] Date of Service: 12/21/2015 3:45 PM Medical Record Number: [id number] Patient Account Number: [id number] Date of Birth/Sex: 27-Oct-1950 (64 y.o. Female) Treating RN: Carolyne Fiscal, Debi Primary Care Physician: LADA, [place] Other Clinician: Referring Physician: LADA, MELINDA Treating Physician/Extender: Frann Rider in Treatment: 4 Subjective Chief Complaint Information obtained from Patient Patient seen for complaints of Non-Healing Wound to the left and right lower extremity and the right dorsum of the foot for about 6 months now History of Present Illness (HPI) The following HPI elements were documented for the patient's wound: Location: left and right lower extremity and dorsum of the right foot Quality: Patient  reports experiencing a sharp pain to affected area(s). Severity: Patient states wound are getting worse. Duration: Patient has had the wound for > 6 months prior to seeking treatment at the wound center Timing: Pain in wound is Intermittent (comes and goes Context: The wound appeared gradually over time Modifying Factors: Other treatment(s) tried include: symptomatic treatment as advised by her PCP Associated Signs and Symptoms: Patient reports having increase swelling. 65 year old with a past medical history significant for MS, urinary incontinence, and obesity. She has been seen in the wound clinic before for lower extremity ulcerations treated with compression therapy. she is also known to have hypertension, peripheral vascular disease, COPD, obstructive sleep apnea, lumbar radiculopathy, kyphoscoliosis, urinary issues and tobacco abuse. [name] a packet of cigarettes a day was recently seen at the [facility] for swelling of her legs and feet with a ulceration on the dorsum of the right foot which has been there for  about 6 months. she was recently in the ER about a month ago where she was seen for shortness of breath and swelling of the legs and a chest x-ray was within normal limits had an increase in her BNP and was given Lasix and put on doxycycline for a mild cellulitis and possible UTI. Wounds aresmaller today 11/13/15. Debrided and will continue Santyl. 12/21/2015 -- she was admitted to the hospital overnight on 12/18/2015 and diagnosed with urinary retention and cellulitis of the left lower leg. is past to take clindamycin and use Santyl for her wounds. Objective Constitutional [name], [name] H. ([id number]) Pulse regular. Respirations normal and unlabored. Afebrile. Vitals Time Taken: 4:10 PM, Height: 63 in, Weight: 270 lbs, BMI: 47.8, Temperature: 98.7 F, Pulse: 100 bpm, Respiratory Rate: 20 breaths/min, Blood Pressure: 135/86 mmHg. Eyes Nonicteric. Reactive to light. Ears, Nose, Mouth, and Throat Lips, teeth, and gums WNL.Marland Kitchen Moist mucosa without lesions. Neck supple and nontender. No palpable supraclavicular or cervical adenopathy. Normal sized without goiter. Respiratory WNL. No retractions.. Breath sounds WNL, No rubs, rales, rhonchi, or wheeze.. Cardiovascular Heart rhythm and rate regular, no murmur or gallop.. Pedal Pulses WNL. No clubbing, cyanosis or edema. Chest Breasts symmetical and no nipple discharge.. Breast tissue WNL, no masses, lumps, or tenderness.. Lymphatic No adneopathy. No adenopathy. No adenopathy. Musculoskeletal Adexa without tenderness or enlargement.. Digits and nails w/o clubbing, cyanosis, infection, petechiae, ischemia, or inflammatory conditions.Marland Kitchen Psychiatric Judgement and insight Intact.. No evidence of depression, anxiety, or agitation.. General Notes: the wounds on her legs bilaterally continue to have subcutaneous debris which was washed out with moist saline and is too tender to sharply dissected. Integumentary (Hair, Skin) No suspicious lesions. No  crepitus or fluctuance. No peri-wound warmth or erythema. No masses.. Wound #2 status is Open. Original cause of wound was Trauma. The wound is located on the Right,Dorsal Foot. The wound measures 0.4cm length x 1.1cm width x 0.1cm depth; 0.346cm^2 area and 0.035cm^3 volume. The wound is limited to skin breakdown. There is no tunneling or undermining noted. There is a large amount of serous drainage noted. The wound margin is flat and intact. There is no granulation within the wound bed. There is a large (67-100%) amount of necrotic tissue within the wound bed including Adherent Slough. The periwound skin appearance exhibited: Localized Edema, Ecchymosis. The periwound skin appearance did not exhibit: Callus, Crepitus, Excoriation, Fluctuance, Friable, Induration, Rash, Scarring, Dry/Scaly, Maceration, Moist, Atrophie Blanche, Cyanosis, Hemosiderin Staining, Mottled, Pallor, Rubor, Erythema. [name], [name] ([id number]) Wound #3 status is Open. Original cause of wound was Trauma. The  wound is located on the Right Lower Leg. The wound measures 0.6cm length x 0.6cm width x 0.2cm depth; 0.283cm^2 area and 0.057cm^3 volume. The wound is limited to skin breakdown. There is no tunneling or undermining noted. There is a large amount of serous drainage noted. The wound margin is flat and intact. There is no granulation within the wound bed. There is a large (67-100%) amount of necrotic tissue within the wound bed including Eschar. The periwound skin appearance exhibited: Localized Edema. The periwound skin appearance did not exhibit: Callus, Crepitus, Excoriation, Fluctuance, Friable, Induration, Rash, Scarring, Dry/Scaly, Maceration, Moist, Atrophie Blanche, Cyanosis, Ecchymosis, Hemosiderin Staining, Mottled, Pallor, Rubor, Erythema. Wound #4 status is Open. Original cause of wound was Trauma. The wound is located on the Left,Proximal,Lateral Lower Leg. The wound measures 0.8cm length x 1.2cm width x  0.1cm depth; 0.754cm^2 area and 0.075cm^3 volume. The wound is limited to skin breakdown. There is no tunneling or undermining noted. There is a large amount of serous drainage noted. The wound margin is flat and intact. There is no granulation within the wound bed. There is a large (67-100%) amount of necrotic tissue within the wound bed including Adherent Slough. The periwound skin appearance exhibited: Localized Edema. The periwound skin appearance did not exhibit: Callus, Crepitus, Excoriation, Fluctuance, Friable, Induration, Rash, Scarring, Dry/Scaly, Maceration, Moist, Atrophie Blanche, Cyanosis, Ecchymosis, Hemosiderin Staining, Mottled, Pallor, Rubor, Erythema. Wound #5 status is Open. Original cause of wound was Gradually Appeared. The wound is located on the Left,Distal,Lateral Lower Leg. The wound measures 1.4cm length x 0.5cm width x 0.1cm depth; 0.55cm^2 area and 0.055cm^3 volume. The wound is limited to skin breakdown. There is no tunneling or undermining noted. There is a large amount of serous drainage noted. The wound margin is distinct with the outline attached to the wound base. There is no granulation within the wound bed. There is a large (67-100%) amount of necrotic tissue within the wound bed including Adherent Slough. The periwound skin appearance exhibited: Moist, Erythema. The surrounding wound skin color is noted with erythema which is circumferential. Periwound temperature was noted as No Abnormality. The periwound has tenderness on palpation. Wound #6 status is Open. Original cause of wound was Gradually Appeared. The wound is located on the Left,Anterior Lower Leg. The wound measures 0.7cm length x 0.8cm width x 0.1cm depth; 0.44cm^2 area and 0.044cm^3 volume. The wound is limited to skin breakdown. There is no tunneling or undermining noted. There is a large amount of serous drainage noted. The wound margin is distinct with the outline attached to the wound base.  There is no granulation within the wound bed. There is a large (67-100%) amount of necrotic tissue within the wound bed including Adherent Slough. The periwound skin appearance exhibited: Moist, Erythema. The periwound skin appearance did not exhibit: Callus, Crepitus, Excoriation, Fluctuance, Friable, Induration, Localized Edema, Rash, Scarring, Dry/Scaly, Maceration, Atrophie Blanche, Cyanosis, Ecchymosis, Hemosiderin Staining, Mottled, Pallor, Rubor. The surrounding wound skin color is noted with erythema which is circumferential. Periwound temperature was noted as No Abnormality. The periwound has tenderness on palpation. Assessment Active Problems ICD-10 [name], [name] G35 - Multiple sclerosis E66.01 - Morbid (severe) obesity due to excess calories L97.512 - Non-pressure chronic ulcer of other part of right foot with fat layer exposed L97.212 - Non-pressure chronic ulcer of right calf with fat layer exposed L97.222 - Non-pressure chronic ulcer of left calf with fat layer exposed F17.218 - Nicotine dependence, cigarettes, with other nicotine-induced disorders Plan Wound Cleansing: Wound #2 Right,Dorsal  Foot: May Shower, gently pat wound dry prior to applying new dressing. Wound #3 Right Lower Leg: May Shower, gently pat wound dry prior to applying new dressing. Wound #4 Left,Proximal,Lateral Lower Leg: May Shower, gently pat wound dry prior to applying new dressing. Wound #5 Left,Distal,Lateral Lower Leg: May Shower, gently pat wound dry prior to applying new dressing. Wound #6 Left,Anterior Lower Leg: May Shower, gently pat wound dry prior to applying new dressing. Anesthetic: Wound #2 Right,Dorsal Foot: Topical Lidocaine 4% cream applied to wound bed prior to debridement - in clinic only Wound #3 Right Lower Leg: Topical Lidocaine 4% cream applied to wound bed prior to debridement - in clinic only Wound #4 Left,Proximal,Lateral Lower Leg: Topical Lidocaine 4%  cream applied to wound bed prior to debridement - in clinic only Wound #5 Left,Distal,Lateral Lower Leg: Topical Lidocaine 4% cream applied to wound bed prior to debridement - in clinic only Wound #6 Left,Anterior Lower Leg: Topical Lidocaine 4% cream applied to wound bed prior to debridement - in clinic only Skin Barriers/Peri-Wound Care: Wound #2 Right,Dorsal Foot: Skin Prep Wound #3 Right Lower Leg: Skin Prep Wound #4 Left,Proximal,Lateral Lower Leg: Skin Prep Wound #5 Left,Distal,Lateral Lower Leg: Skin Prep Wound #6 Left,Anterior Lower Leg: Skin Prep Primary Wound Dressing: Wound #2 Right,Dorsal Foot: Santyl Ointment [name], [name] H. ([id number]) Medihoney gel - When you run out of santyl Wound #5 Left,Distal,Lateral Lower Leg: Santyl Ointment Medihoney gel - When you run out of santyl Wound #6 Left,Anterior Lower Leg: Santyl Ointment Medihoney gel - When you run out of santyl Wound #3 Right Lower Leg: Santyl Ointment Medihoney gel - When you run out of santyl Wound #4 Left,Proximal,Lateral Lower Leg: Santyl Ointment Medihoney gel - When you run out of santyl Secondary Dressing: Wound #2 Right,Dorsal Foot: Dry Gauze Boardered Foam Dressing Wound #3 Right Lower Leg: Dry Gauze Boardered Foam Dressing Wound #4 Left,Proximal,Lateral Lower Leg: Dry Gauze Boardered Foam Dressing Wound #5 Left,Distal,Lateral Lower Leg: Dry Gauze Dry Gauze Boardered Foam Dressing Boardered Foam Dressing Wound #6 Left,Anterior Lower Leg: Dry Gauze Dry Gauze Boardered Foam Dressing Boardered Foam Dressing Dressing Change Frequency: Wound #2 Right,Dorsal Foot: Change dressing every day. Wound #3 Right Lower Leg: Change dressing every day. Wound #4 Left,Proximal,Lateral Lower Leg: Change dressing every day. Wound #5 Left,Distal,Lateral Lower Leg: Change dressing every day. Wound #6 Left,Anterior Lower Leg: Change dressing every day. Follow-up Appointments: Wound #2 Right,Dorsal  Foot: Return Appointment in 1 week. Wound #3 Right Lower Leg: Return Appointment in 1 week. Wound #4 Left,Proximal,Lateral Lower Leg: Return Appointment in 1 week. [name], [name] ([id number]) Wound #5 Left,Distal,Lateral Lower Leg: Return Appointment in 1 week. Wound #6 Left,Anterior Lower Leg: Return Appointment in 1 week. Edema Control: Wound #2 Right,Dorsal Foot: Elevate legs to the level of the heart and pump ankles as often as possible Wound #3 Right Lower Leg: Elevate legs to the level of the heart and pump ankles as often as possible Wound #4 Left,Proximal,Lateral Lower Leg: Elevate legs to the level of the heart and pump ankles as often as possible Wound #5 Left,Distal,Lateral Lower Leg: Elevate legs to the level of the heart and pump ankles as often as possible Wound #6 Left,Anterior Lower Leg: Elevate legs to the level of the heart and pump ankles as often as possible Additional Orders / Instructions: Wound #2 Right,Dorsal Foot: Stop Smoking Increase protein intake. Wound #3 Right Lower Leg: Stop Smoking Increase protein intake. Wound #4 Left,Proximal,Lateral Lower Leg: Stop Smoking Increase protein intake. Wound #5  Left,Distal,Lateral Lower Leg: Stop Smoking Increase protein intake. Wound #6 Left,Anterior Lower Leg: Stop Smoking Increase protein intake. I have recommended: 1. Using Santyl over the wound and applying a bordered foam over this. 2. washing her legs well with soap and water and making sure she does her dressing as advised 3. again addressed giving up smoking and discused the benefits 4. Seeing her back for regular visits Electronic Signature(s) Signed: 12/22/2015 7:51:34 AM By: Christin Fudge MD, FACS Previous Signature: 12/21/2015 4:49:35 PM Version By: Christin Fudge MD, FACS Previous Signature: 12/21/2015 4:45:16 PM Version By: Christin Fudge MD, FACS Entered By: Christin Fudge on 12/22/2015 07:51:34 [name], [name] [name], [name]  ([id number]) -------------------------------------------------------------------------------- SuperBill Details Patient Name: [name] Date of Service: 12/21/2015 Medical Record Number: [id number] Patient Account Number: [id number] Date of Birth/Sex: 09-20-1950 (65 y.o. 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 F17.218 Nicotine dependence, cigarettes, with other nicotine-induced disorders Facility Procedures CPT4 Code: [id number] Description: [id number] - WOUND CARE VISIT-LEV 5 EST PT Modifier: Quantity: 1 Physician Procedures CPT4 Code Description: [id number] - WC PHYS LEVEL 3 - EST PT ICD-10 Description Diagnosis G35 Multiple sclerosis L97.512 Non-pressure chronic ulcer of other part of right fo L97.212 Non-pressure chronic ulcer of right calf with fat la L97.222  Non-pressure chronic ulcer of left calf with fat lay Modifier: ot with fat lay yer exposed er exposed Quantity: 1 er exposed Electronic Signature(s) Signed: 12/21/2015 4:46:09 PM By: Christin Fudge MD, FACS Entered By: Christin Fudge on 12/21/2015 16:46:09

## 2015-12-26 DIAGNOSIS — I70232 Atherosclerosis of native arteries of right leg with ulceration of calf: Secondary | ICD-10-CM | POA: Diagnosis not present

## 2015-12-26 DIAGNOSIS — I70249 Atherosclerosis of native arteries of left leg with ulceration of unspecified site: Secondary | ICD-10-CM | POA: Diagnosis not present

## 2015-12-26 DIAGNOSIS — L97929 Non-pressure chronic ulcer of unspecified part of left lower leg with unspecified severity: Secondary | ICD-10-CM | POA: Diagnosis not present

## 2015-12-26 DIAGNOSIS — J449 Chronic obstructive pulmonary disease, unspecified: Secondary | ICD-10-CM | POA: Diagnosis not present

## 2015-12-26 DIAGNOSIS — G35 Multiple sclerosis: Secondary | ICD-10-CM | POA: Diagnosis not present

## 2015-12-26 DIAGNOSIS — L97219 Non-pressure chronic ulcer of right calf with unspecified severity: Secondary | ICD-10-CM | POA: Diagnosis not present

## 2015-12-26 DIAGNOSIS — L03116 Cellulitis of left lower limb: Secondary | ICD-10-CM | POA: Diagnosis not present

## 2015-12-26 DIAGNOSIS — Z792 Long term (current) use of antibiotics: Secondary | ICD-10-CM | POA: Diagnosis not present

## 2015-12-26 DIAGNOSIS — L97419 Non-pressure chronic ulcer of right heel and midfoot with unspecified severity: Secondary | ICD-10-CM | POA: Diagnosis not present

## 2015-12-26 DIAGNOSIS — I10 Essential (primary) hypertension: Secondary | ICD-10-CM | POA: Diagnosis not present

## 2015-12-26 DIAGNOSIS — I70234 Atherosclerosis of native arteries of right leg with ulceration of heel and midfoot: Secondary | ICD-10-CM | POA: Diagnosis not present

## 2015-12-26 DIAGNOSIS — R339 Retention of urine, unspecified: Secondary | ICD-10-CM | POA: Diagnosis not present

## 2015-12-27 DIAGNOSIS — I70249 Atherosclerosis of native arteries of left leg with ulceration of unspecified site: Secondary | ICD-10-CM | POA: Diagnosis not present

## 2015-12-27 DIAGNOSIS — R339 Retention of urine, unspecified: Secondary | ICD-10-CM | POA: Diagnosis not present

## 2015-12-27 DIAGNOSIS — I70234 Atherosclerosis of native arteries of right leg with ulceration of heel and midfoot: Secondary | ICD-10-CM | POA: Diagnosis not present

## 2015-12-27 DIAGNOSIS — L97219 Non-pressure chronic ulcer of right calf with unspecified severity: Secondary | ICD-10-CM | POA: Diagnosis not present

## 2015-12-27 DIAGNOSIS — G35 Multiple sclerosis: Secondary | ICD-10-CM | POA: Diagnosis not present

## 2015-12-27 DIAGNOSIS — L97929 Non-pressure chronic ulcer of unspecified part of left lower leg with unspecified severity: Secondary | ICD-10-CM | POA: Diagnosis not present

## 2015-12-27 DIAGNOSIS — I70232 Atherosclerosis of native arteries of right leg with ulceration of calf: Secondary | ICD-10-CM | POA: Diagnosis not present

## 2015-12-27 DIAGNOSIS — L03116 Cellulitis of left lower limb: Secondary | ICD-10-CM | POA: Diagnosis not present

## 2015-12-27 DIAGNOSIS — J449 Chronic obstructive pulmonary disease, unspecified: Secondary | ICD-10-CM | POA: Diagnosis not present

## 2015-12-27 DIAGNOSIS — I10 Essential (primary) hypertension: Secondary | ICD-10-CM | POA: Diagnosis not present

## 2015-12-27 DIAGNOSIS — L97419 Non-pressure chronic ulcer of right heel and midfoot with unspecified severity: Secondary | ICD-10-CM | POA: Diagnosis not present

## 2015-12-27 DIAGNOSIS — Z792 Long term (current) use of antibiotics: Secondary | ICD-10-CM | POA: Diagnosis not present

## 2015-12-28 ENCOUNTER — Ambulatory Visit: Payer: Medicare Other | Admitting: Surgery

## 2015-12-28 DIAGNOSIS — J449 Chronic obstructive pulmonary disease, unspecified: Secondary | ICD-10-CM | POA: Diagnosis not present

## 2015-12-28 DIAGNOSIS — G35 Multiple sclerosis: Secondary | ICD-10-CM | POA: Diagnosis not present

## 2015-12-28 DIAGNOSIS — L97219 Non-pressure chronic ulcer of right calf with unspecified severity: Secondary | ICD-10-CM | POA: Diagnosis not present

## 2015-12-28 DIAGNOSIS — L03116 Cellulitis of left lower limb: Secondary | ICD-10-CM | POA: Diagnosis not present

## 2015-12-28 DIAGNOSIS — I70232 Atherosclerosis of native arteries of right leg with ulceration of calf: Secondary | ICD-10-CM | POA: Diagnosis not present

## 2015-12-28 DIAGNOSIS — R339 Retention of urine, unspecified: Secondary | ICD-10-CM | POA: Diagnosis not present

## 2015-12-28 DIAGNOSIS — L97419 Non-pressure chronic ulcer of right heel and midfoot with unspecified severity: Secondary | ICD-10-CM | POA: Diagnosis not present

## 2015-12-28 DIAGNOSIS — Z792 Long term (current) use of antibiotics: Secondary | ICD-10-CM | POA: Diagnosis not present

## 2015-12-28 DIAGNOSIS — I70234 Atherosclerosis of native arteries of right leg with ulceration of heel and midfoot: Secondary | ICD-10-CM | POA: Diagnosis not present

## 2015-12-28 DIAGNOSIS — L97929 Non-pressure chronic ulcer of unspecified part of left lower leg with unspecified severity: Secondary | ICD-10-CM | POA: Diagnosis not present

## 2015-12-28 DIAGNOSIS — I10 Essential (primary) hypertension: Secondary | ICD-10-CM | POA: Diagnosis not present

## 2015-12-28 DIAGNOSIS — I70249 Atherosclerosis of native arteries of left leg with ulceration of unspecified site: Secondary | ICD-10-CM | POA: Diagnosis not present

## 2015-12-29 DIAGNOSIS — I70234 Atherosclerosis of native arteries of right leg with ulceration of heel and midfoot: Secondary | ICD-10-CM | POA: Diagnosis not present

## 2015-12-29 DIAGNOSIS — I10 Essential (primary) hypertension: Secondary | ICD-10-CM | POA: Diagnosis not present

## 2015-12-29 DIAGNOSIS — G35 Multiple sclerosis: Secondary | ICD-10-CM | POA: Diagnosis not present

## 2015-12-29 DIAGNOSIS — I70249 Atherosclerosis of native arteries of left leg with ulceration of unspecified site: Secondary | ICD-10-CM | POA: Diagnosis not present

## 2015-12-29 DIAGNOSIS — R339 Retention of urine, unspecified: Secondary | ICD-10-CM | POA: Diagnosis not present

## 2015-12-29 DIAGNOSIS — L97219 Non-pressure chronic ulcer of right calf with unspecified severity: Secondary | ICD-10-CM | POA: Diagnosis not present

## 2015-12-29 DIAGNOSIS — L03116 Cellulitis of left lower limb: Secondary | ICD-10-CM | POA: Diagnosis not present

## 2015-12-29 DIAGNOSIS — J449 Chronic obstructive pulmonary disease, unspecified: Secondary | ICD-10-CM | POA: Diagnosis not present

## 2015-12-29 DIAGNOSIS — Z792 Long term (current) use of antibiotics: Secondary | ICD-10-CM | POA: Diagnosis not present

## 2015-12-29 DIAGNOSIS — I70232 Atherosclerosis of native arteries of right leg with ulceration of calf: Secondary | ICD-10-CM | POA: Diagnosis not present

## 2015-12-29 DIAGNOSIS — L97419 Non-pressure chronic ulcer of right heel and midfoot with unspecified severity: Secondary | ICD-10-CM | POA: Diagnosis not present

## 2015-12-29 DIAGNOSIS — L97929 Non-pressure chronic ulcer of unspecified part of left lower leg with unspecified severity: Secondary | ICD-10-CM | POA: Diagnosis not present

## 2016-01-01 DIAGNOSIS — R339 Retention of urine, unspecified: Secondary | ICD-10-CM | POA: Diagnosis not present

## 2016-01-01 DIAGNOSIS — J449 Chronic obstructive pulmonary disease, unspecified: Secondary | ICD-10-CM | POA: Diagnosis not present

## 2016-01-01 DIAGNOSIS — Z792 Long term (current) use of antibiotics: Secondary | ICD-10-CM | POA: Diagnosis not present

## 2016-01-01 DIAGNOSIS — L97929 Non-pressure chronic ulcer of unspecified part of left lower leg with unspecified severity: Secondary | ICD-10-CM | POA: Diagnosis not present

## 2016-01-01 DIAGNOSIS — I70249 Atherosclerosis of native arteries of left leg with ulceration of unspecified site: Secondary | ICD-10-CM | POA: Diagnosis not present

## 2016-01-01 DIAGNOSIS — I10 Essential (primary) hypertension: Secondary | ICD-10-CM | POA: Diagnosis not present

## 2016-01-01 DIAGNOSIS — L97219 Non-pressure chronic ulcer of right calf with unspecified severity: Secondary | ICD-10-CM | POA: Diagnosis not present

## 2016-01-01 DIAGNOSIS — I70232 Atherosclerosis of native arteries of right leg with ulceration of calf: Secondary | ICD-10-CM | POA: Diagnosis not present

## 2016-01-01 DIAGNOSIS — G35 Multiple sclerosis: Secondary | ICD-10-CM | POA: Diagnosis not present

## 2016-01-01 DIAGNOSIS — L03116 Cellulitis of left lower limb: Secondary | ICD-10-CM | POA: Diagnosis not present

## 2016-01-01 DIAGNOSIS — I70234 Atherosclerosis of native arteries of right leg with ulceration of heel and midfoot: Secondary | ICD-10-CM | POA: Diagnosis not present

## 2016-01-01 DIAGNOSIS — L97419 Non-pressure chronic ulcer of right heel and midfoot with unspecified severity: Secondary | ICD-10-CM | POA: Diagnosis not present

## 2016-01-02 DIAGNOSIS — L03116 Cellulitis of left lower limb: Secondary | ICD-10-CM | POA: Diagnosis not present

## 2016-01-03 DIAGNOSIS — G35 Multiple sclerosis: Secondary | ICD-10-CM | POA: Diagnosis not present

## 2016-01-03 DIAGNOSIS — L97419 Non-pressure chronic ulcer of right heel and midfoot with unspecified severity: Secondary | ICD-10-CM | POA: Diagnosis not present

## 2016-01-03 DIAGNOSIS — J449 Chronic obstructive pulmonary disease, unspecified: Secondary | ICD-10-CM | POA: Diagnosis not present

## 2016-01-03 DIAGNOSIS — Z792 Long term (current) use of antibiotics: Secondary | ICD-10-CM | POA: Diagnosis not present

## 2016-01-03 DIAGNOSIS — L03116 Cellulitis of left lower limb: Secondary | ICD-10-CM | POA: Diagnosis not present

## 2016-01-03 DIAGNOSIS — I70249 Atherosclerosis of native arteries of left leg with ulceration of unspecified site: Secondary | ICD-10-CM | POA: Diagnosis not present

## 2016-01-03 DIAGNOSIS — I10 Essential (primary) hypertension: Secondary | ICD-10-CM | POA: Diagnosis not present

## 2016-01-03 DIAGNOSIS — L97929 Non-pressure chronic ulcer of unspecified part of left lower leg with unspecified severity: Secondary | ICD-10-CM | POA: Diagnosis not present

## 2016-01-03 DIAGNOSIS — I70232 Atherosclerosis of native arteries of right leg with ulceration of calf: Secondary | ICD-10-CM | POA: Diagnosis not present

## 2016-01-03 DIAGNOSIS — R339 Retention of urine, unspecified: Secondary | ICD-10-CM | POA: Diagnosis not present

## 2016-01-03 DIAGNOSIS — I70234 Atherosclerosis of native arteries of right leg with ulceration of heel and midfoot: Secondary | ICD-10-CM | POA: Diagnosis not present

## 2016-01-03 DIAGNOSIS — L97219 Non-pressure chronic ulcer of right calf with unspecified severity: Secondary | ICD-10-CM | POA: Diagnosis not present

## 2016-01-04 ENCOUNTER — Ambulatory Visit: Payer: Medicare Other | Admitting: Surgery

## 2016-01-04 DIAGNOSIS — J449 Chronic obstructive pulmonary disease, unspecified: Secondary | ICD-10-CM | POA: Diagnosis not present

## 2016-01-04 DIAGNOSIS — G35 Multiple sclerosis: Secondary | ICD-10-CM | POA: Diagnosis not present

## 2016-01-04 DIAGNOSIS — I70232 Atherosclerosis of native arteries of right leg with ulceration of calf: Secondary | ICD-10-CM | POA: Diagnosis not present

## 2016-01-04 DIAGNOSIS — L97419 Non-pressure chronic ulcer of right heel and midfoot with unspecified severity: Secondary | ICD-10-CM | POA: Diagnosis not present

## 2016-01-04 DIAGNOSIS — L03116 Cellulitis of left lower limb: Secondary | ICD-10-CM | POA: Diagnosis not present

## 2016-01-04 DIAGNOSIS — L97219 Non-pressure chronic ulcer of right calf with unspecified severity: Secondary | ICD-10-CM | POA: Diagnosis not present

## 2016-01-04 DIAGNOSIS — Z792 Long term (current) use of antibiotics: Secondary | ICD-10-CM | POA: Diagnosis not present

## 2016-01-04 DIAGNOSIS — I70234 Atherosclerosis of native arteries of right leg with ulceration of heel and midfoot: Secondary | ICD-10-CM | POA: Diagnosis not present

## 2016-01-04 DIAGNOSIS — I10 Essential (primary) hypertension: Secondary | ICD-10-CM | POA: Diagnosis not present

## 2016-01-04 DIAGNOSIS — R339 Retention of urine, unspecified: Secondary | ICD-10-CM | POA: Diagnosis not present

## 2016-01-04 DIAGNOSIS — L97929 Non-pressure chronic ulcer of unspecified part of left lower leg with unspecified severity: Secondary | ICD-10-CM | POA: Diagnosis not present

## 2016-01-04 DIAGNOSIS — I70249 Atherosclerosis of native arteries of left leg with ulceration of unspecified site: Secondary | ICD-10-CM | POA: Diagnosis not present

## 2016-01-05 ENCOUNTER — Ambulatory Visit (INDEPENDENT_AMBULATORY_CARE_PROVIDER_SITE_OTHER): Payer: Medicare Other | Admitting: Family Medicine

## 2016-01-05 ENCOUNTER — Encounter: Payer: Self-pay | Admitting: Family Medicine

## 2016-01-05 VITALS — BP 132/64 | HR 96 | Temp 98.9°F | Resp 16 | Wt 275.5 lb

## 2016-01-05 DIAGNOSIS — R220 Localized swelling, mass and lump, head: Secondary | ICD-10-CM | POA: Diagnosis not present

## 2016-01-05 DIAGNOSIS — I10 Essential (primary) hypertension: Secondary | ICD-10-CM | POA: Diagnosis not present

## 2016-01-05 DIAGNOSIS — Z23 Encounter for immunization: Secondary | ICD-10-CM

## 2016-01-05 DIAGNOSIS — J441 Chronic obstructive pulmonary disease with (acute) exacerbation: Secondary | ICD-10-CM | POA: Diagnosis not present

## 2016-01-05 DIAGNOSIS — Z720 Tobacco use: Secondary | ICD-10-CM

## 2016-01-05 DIAGNOSIS — H61892 Other specified disorders of left external ear: Secondary | ICD-10-CM

## 2016-01-05 DIAGNOSIS — Z1239 Encounter for other screening for malignant neoplasm of breast: Secondary | ICD-10-CM | POA: Diagnosis not present

## 2016-01-05 HISTORY — DX: Morbid (severe) obesity due to excess calories: E66.01

## 2016-01-05 MED ORDER — FLUTICASONE FUROATE-VILANTEROL 100-25 MCG/INH IN AEPB: 1.0000 | INHALATION_SPRAY | Freq: Every day | RESPIRATORY_TRACT | 11 refills | Status: DC

## 2016-01-05 NOTE — Assessment & Plan Note (Signed)
Encouraged her to work on weight loss; see AVS; praised her for further weight loss

## 2016-01-05 NOTE — Patient Instructions (Addendum)
Please do call to schedule your mammogram; the number to schedule one at either [facility] or [facility] is [phone]  I do encourage you to quit smoking Call [phone] to sign up for smoking cessation classes You can call 1-800-QUIT-NOW to talk with a smoking cessation coach  STOP spiriva Start Breo  Check out the information at familydoctor.org entitled "Nutrition for Weight Loss: What You Need to Know about Fad Diets" Try to lose between 1-2 pounds per week by taking in fewer calories and burning off more calories You can succeed by limiting portions, limiting foods dense in calories and fat, becoming more active, and drinking 8 glasses of water a day (64 ounces) Don't skip meals, especially breakfast, as skipping meals may alter your metabolism Do not use over-the-counter weight loss pills or gimmicks that claim rapid weight loss A healthy BMI (or body mass index) is between 18.5 and 24.9 You can calculate your ideal BMI at the [employer] website [employer]Mon[employer].fr    Smoking Cessation, Tips for Success If you are ready to quit smoking, congratulations! You have chosen to help yourself be healthier. Cigarettes bring nicotine, tar, carbon monoxide, and other irritants into your body. Your lungs, heart, and blood vessels will be able to work better without these poisons. There are many different ways to quit smoking. Nicotine gum, nicotine patches, a nicotine inhaler, or nicotine nasal spray can help with physical craving. Hypnosis, support groups, and medicines help break the habit of smoking. WHAT THINGS CAN I DO TO MAKE QUITTING EASIER?  Here are some tips to help you quit for good:  Pick a date when you will quit smoking completely. Tell all of your friends and family about your plan to quit on that date.  Do not try to slowly cut down on the number of cigarettes you are smoking. Pick a quit date and quit  smoking completely starting on that day.  Throw away all cigarettes.   Clean and remove all ashtrays from your home, work, and car.  On a card, write down your reasons for quitting. Carry the card with you and read it when you get the urge to smoke.  Cleanse your body of nicotine. Drink enough water and fluids to keep your urine clear or pale yellow. Do this after quitting to flush the nicotine from your body.  Learn to predict your moods. Do not let a bad situation be your excuse to have a cigarette. Some situations in your life might tempt you into wanting a cigarette.  Never have "just one" cigarette. It leads to wanting another and another. Remind yourself of your decision to quit.  Change habits associated with smoking. If you smoked while driving or when feeling stressed, try other activities to replace smoking. Stand up when drinking your coffee. Brush your teeth after eating. Sit in a different chair when you read the paper. Avoid alcohol while trying to quit, and try to drink fewer caffeinated beverages. Alcohol and caffeine may urge you to smoke.  Avoid foods and drinks that can trigger a desire to smoke, such as sugary or spicy foods and alcohol.  Ask people who smoke not to smoke around you.  Have something planned to do right after eating or having a cup of coffee. For example, plan to take a walk or exercise.  Try a relaxation exercise to calm you down and decrease your stress. Remember, you may be tense and nervous for the first 2 weeks after you quit, but  this will pass.  Find new activities to keep your hands busy. Play with a pen, coin, or rubber band. Doodle or draw things on paper.  Brush your teeth right after eating. This will help cut down on the craving for the taste of tobacco after meals. You can also try mouthwash.   Use oral substitutes in place of cigarettes. Try using lemon drops, carrots, cinnamon sticks, or chewing gum. Keep them handy so they are  available when you have the urge to smoke.  When you have the urge to smoke, try deep breathing.  Designate your home as a nonsmoking area.  If you are a heavy smoker, ask your health care provider about a prescription for nicotine chewing gum. It can ease your withdrawal from nicotine.  Reward yourself. Set aside the cigarette money you save and buy yourself something nice.  Look for support from others. Join a support group or smoking cessation program. Ask someone at home or at work to help you with your plan to quit smoking.  Always ask yourself, "Do I need this cigarette or is this just a reflex?" Tell yourself, "Today, I choose not to smoke," or "I do not want to smoke." You are reminding yourself of your decision to quit.  Do not replace cigarette smoking with electronic cigarettes (commonly called e-cigarettes). The safety of e-cigarettes is unknown, and some may contain harmful chemicals.  If you relapse, do not give up! Plan ahead and think about what you will do the next time you get the urge to smoke. HOW WILL I FEEL WHEN I QUIT SMOKING? You may have symptoms of withdrawal because your body is used to nicotine (the addictive substance in cigarettes). You may crave cigarettes, be irritable, feel very hungry, cough often, get headaches, or have difficulty concentrating. The withdrawal symptoms are only temporary. They are strongest when you first quit but will go away within 10-14 days. When withdrawal symptoms occur, stay in control. Think about your reasons for quitting. Remind yourself that these are signs that your body is healing and getting used to being without cigarettes. Remember that withdrawal symptoms are easier to treat than the major diseases that smoking can cause.  Even after the withdrawal is over, expect periodic urges to smoke. However, these cravings are generally short lived and will go away whether you smoke or not. Do not smoke! WHAT RESOURCES ARE AVAILABLE TO  HELP ME QUIT SMOKING? Your health care provider can direct you to community resources or hospitals for support, which may include:  Group support.  Education.  Hypnosis.  Therapy.   This information is not intended to replace advice given to you by your health care provider. Make sure you discuss any questions you have with your health care provider.   Document Released: 01/12/2004 Document Revised: 05/06/2014 Document Reviewed: 10/01/2012 Elsevier Interactive Patient Education [employer].

## 2016-01-05 NOTE — Assessment & Plan Note (Signed)
Blood pressure controlled. 

## 2016-01-05 NOTE — Assessment & Plan Note (Signed)
Stop spiriva and start Breo; smoking cessation encouraged

## 2016-01-05 NOTE — Progress Notes (Signed)
BP 132/64   Pulse 96   Temp 98.9 F (37.2 C) (Oral)   Resp 16   Wt 275 lb 8 oz (125 kg)   SpO2 96%   BMI 48.80 kg/m    Subjective:    Patient ID: [name], female    DOB: 12-06-1950, 65 y.o.   MRN: [id number]  HPI: [name] is a 65 y.o. female  Chief Complaint  Patient presents with  . Follow-up  . Mass    right nostril  . Foreign Body in Ear    cotton   She thinks she has some cotton in her left ear; had a qtip and some came off; would like that checked  She has something in her right nostril; a growth in the tip of her nose on the right side; not wanting to mention for a long time; not draining or bleeding; not sore; it has not grown at all for years; just wanted to mention; gets fever blisters from time to time  She needs a screening mammogram; she has no lumps in her breasts; no fam hx of breast cancer  She does not like spiriva and wants to use Breo instead; she does not see a pulmonologist  Going to wound center for her leg ulcers  Relevant past medical, surgical, family and social history reviewed Past Medical History:  Diagnosis Date  . Anxiety   . COPD (chronic obstructive pulmonary disease) ([place])   . Depression   . Hypertension   . Kyphoscoliosis and scoliosis 11/26/2011  . Morbid obesity ([place]) 01/05/2016  . Multiple sclerosis ([place])   . Neuromuscular disorder ([place])   . Peripheral vascular disease of lower extremity with ulceration ([place]) 11/08/2015  . Skin ulcer ([place]) 11/08/2015   Past Surgical History:  Procedure Laterality Date  . BACK SURGERY N/A 2002  . TONSILLECTOMY AND ADENOIDECTOMY    . TUBAL LIGATION     Family History  Problem Relation Age of Onset  . COPD Mother   . Diabetes Mother   . Alcohol abuse Father   . Kidney disease Father    Social History  Substance Use Topics  . Smoking status: Current Some Day Smoker    Types: Cigarettes  . Smokeless tobacco: Never Used  . Alcohol use No  MD note: smoking 1/2 ppd  Interim  medical history since last visit reviewed. Allergies and medications reviewed  Review of Systems Per HPI unless specifically indicated above     Objective:    BP 132/64   Pulse 96   Temp 98.9 F (37.2 C) (Oral)   Resp 16   Wt 275 lb 8 oz (125 kg)   SpO2 96%   BMI 48.80 kg/m   Wt Readings from Last 3 Encounters:  01/05/16 275 lb 8 oz (125 kg)  12/22/15 275 lb (124.7 kg)  12/18/15 277 lb 14.4 oz (126.1 kg)    Physical Exam  Constitutional: She appears well-developed and well-nourished.  Morbidly obese  HENT:  Head: Normocephalic and atraumatic.  Right Ear: Hearing, tympanic membrane, external ear and ear canal normal. No drainage. No foreign bodies. Tympanic membrane is not erythematous. No middle ear effusion.  Left Ear: Hearing, tympanic membrane, external ear and ear canal normal. No drainage. No foreign bodies. Tympanic membrane is not erythematous.  No middle ear effusion.  Nose: No mucosal edema, rhinorrhea, sinus tenderness, nasal deformity or septal deviation.  No foreign bodies.  Mouth/Throat: Mucous membranes are not dry.  Slight swelling right tip of  nose in the vestibule; no skin changes; no drainage; no verrucous or keratotic changes; no changes over the nose externally  Abdominal: She exhibits no distension.  Soft and obese  Musculoskeletal: She exhibits no edema.  Neurological: She is alert. Gait abnormal.  Limited mobility, using 4 wheel rolling walker  Psychiatric: She has a normal mood and affect. Her mood appears not anxious.  Good eye contact with examiner   Results for orders placed or performed during the hospital encounter of 12/18/15  Urinalysis complete, with microscopic- may I&O cath if menses  Result Value Ref Range   Color, Urine YELLOW (A) YELLOW   APPearance CLEAR (A) CLEAR   Glucose, UA NEGATIVE NEGATIVE mg/dL   Bilirubin Urine NEGATIVE NEGATIVE   Ketones, ur NEGATIVE NEGATIVE mg/dL   Specific Gravity, Urine 1.009 1.005 - 1.030   Hgb  urine dipstick NEGATIVE NEGATIVE   pH 7.0 5.0 - 8.0   Protein, ur NEGATIVE NEGATIVE mg/dL   Nitrite NEGATIVE NEGATIVE   Leukocytes, UA NEGATIVE NEGATIVE   RBC / HPF NONE SEEN 0 - 5 RBC/hpf   WBC, UA 0-5 0 - 5 WBC/hpf   Bacteria, UA NONE SEEN NONE SEEN   Squamous Epithelial / LPF 0-5 (A) NONE SEEN  Basic metabolic panel  Result Value Ref Range   Sodium 138 135 - 145 mmol/L   Potassium 3.7 3.5 - 5.1 mmol/L   Chloride 104 101 - 111 mmol/L   CO2 24 22 - 32 mmol/L   Glucose, Bld 96 65 - 99 mg/dL   BUN 20 6 - 20 mg/dL   Creatinine, Ser 1.40 (H) 0.44 - 1.00 mg/dL   Calcium 9.6 8.9 - 10.3 mg/dL   GFR calc non Af Amer 39 (L) >60 mL/min   GFR calc Af Amer 45 (L) >60 mL/min   Anion gap 10 5 - 15  CBC  Result Value Ref Range   WBC 7.3 3.6 - 11.0 K/uL   RBC 4.38 3.80 - 5.20 MIL/uL   Hemoglobin 12.7 12.0 - 16.0 g/dL   HCT 37.4 35.0 - 47.0 %   MCV 85.4 80.0 - 100.0 fL   MCH 29.1 26.0 - 34.0 pg   MCHC 34.1 32.0 - 36.0 g/dL   RDW 14.6 (H) 11.5 - 14.5 %   Platelets 263 150 - 440 K/uL      Assessment & Plan:   Problem List Items Addressed This Visit      Cardiovascular and Mediastinum   Hypertension goal BP (blood pressure) < 140/90    Blood pressure controlled        Respiratory   COPD with bronchial hyperresponsiveness (HCC) - Primary    Stop spiriva and start Breo; smoking cessation encouraged      Relevant Medications   fluticasone furoate-vilanterol (BREO ELLIPTA) 100-25 MCG/INH AEPB     Nervous and Auditory   Foreign body sensation in left ear canal    Evaluated, nothing seen in either canal        Other   Swelling of nose    Slight swelling at tip on the right in vestibule; no evidence of infection, erythema, drainage; may be scar tissue or cyst; will watch      Morbid obesity ([place])    Encouraged her to work on weight loss; see AVS; praised her for further weight loss      Current tobacco use    Encouraged smoking cessation; try to cut down two more cigarettes  per day this week, then two more  cigarettes each week; see AVS      Breast cancer screening    Screening mammogram ordered      Relevant Orders   MM DIGITAL SCREENING BILATERAL    Other Visit Diagnoses    Needs flu shot       Relevant Orders   Flu vaccine HIGH DOSE PF (Fluzone High dose) (Completed)      Follow up plan: Return in about 3 months (around 04/05/2016) for follow-up visit with Dr. Sanda Klein; fasting for labs too.  An after-visit summary was printed and given to the patient at [facility].  Please see the patient instructions which may contain other information and recommendations beyond what is mentioned above in the assessment and plan.  Meds ordered this encounter  Medications  . fluticasone furoate-vilanterol (BREO ELLIPTA) 100-25 MCG/INH AEPB    Sig: Inhale 1 puff into the lungs daily.    Dispense:  1 each    Refill:  11    Orders Placed This Encounter  Procedures  . MM DIGITAL SCREENING BILATERAL  . Flu vaccine HIGH DOSE PF (Fluzone High dose)

## 2016-01-05 NOTE — Assessment & Plan Note (Signed)
Screening mammogram ordered

## 2016-01-05 NOTE — Assessment & Plan Note (Signed)
Encouraged smoking cessation; try to cut down two more cigarettes per day this week, then two more cigarettes each week; see AVS

## 2016-01-07 ENCOUNTER — Encounter: Payer: Self-pay | Admitting: Family Medicine

## 2016-01-07 DIAGNOSIS — R09A9 Foreign body sensation, other site: Secondary | ICD-10-CM | POA: Insufficient documentation

## 2016-01-07 DIAGNOSIS — H61892 Other specified disorders of left external ear: Secondary | ICD-10-CM | POA: Insufficient documentation

## 2016-01-07 DIAGNOSIS — R220 Localized swelling, mass and lump, head: Secondary | ICD-10-CM | POA: Insufficient documentation

## 2016-01-07 NOTE — Assessment & Plan Note (Signed)
Evaluated, nothing seen in either canal

## 2016-01-07 NOTE — Assessment & Plan Note (Signed)
Slight swelling at tip on the right in vestibule; no evidence of infection, erythema, drainage; may be scar tissue or cyst; will watch

## 2016-01-08 ENCOUNTER — Ambulatory Visit: Payer: Medicare Other | Admitting: Surgery

## 2016-01-08 DIAGNOSIS — I70234 Atherosclerosis of native arteries of right leg with ulceration of heel and midfoot: Secondary | ICD-10-CM | POA: Diagnosis not present

## 2016-01-08 DIAGNOSIS — L97219 Non-pressure chronic ulcer of right calf with unspecified severity: Secondary | ICD-10-CM | POA: Diagnosis not present

## 2016-01-08 DIAGNOSIS — J449 Chronic obstructive pulmonary disease, unspecified: Secondary | ICD-10-CM | POA: Diagnosis not present

## 2016-01-08 DIAGNOSIS — L03116 Cellulitis of left lower limb: Secondary | ICD-10-CM | POA: Diagnosis not present

## 2016-01-08 DIAGNOSIS — I70249 Atherosclerosis of native arteries of left leg with ulceration of unspecified site: Secondary | ICD-10-CM | POA: Diagnosis not present

## 2016-01-08 DIAGNOSIS — Z792 Long term (current) use of antibiotics: Secondary | ICD-10-CM | POA: Diagnosis not present

## 2016-01-08 DIAGNOSIS — G35 Multiple sclerosis: Secondary | ICD-10-CM | POA: Diagnosis not present

## 2016-01-08 DIAGNOSIS — L97929 Non-pressure chronic ulcer of unspecified part of left lower leg with unspecified severity: Secondary | ICD-10-CM | POA: Diagnosis not present

## 2016-01-08 DIAGNOSIS — I70232 Atherosclerosis of native arteries of right leg with ulceration of calf: Secondary | ICD-10-CM | POA: Diagnosis not present

## 2016-01-08 DIAGNOSIS — R339 Retention of urine, unspecified: Secondary | ICD-10-CM | POA: Diagnosis not present

## 2016-01-08 DIAGNOSIS — L97419 Non-pressure chronic ulcer of right heel and midfoot with unspecified severity: Secondary | ICD-10-CM | POA: Diagnosis not present

## 2016-01-08 DIAGNOSIS — I10 Essential (primary) hypertension: Secondary | ICD-10-CM | POA: Diagnosis not present

## 2016-01-09 DIAGNOSIS — I10 Essential (primary) hypertension: Secondary | ICD-10-CM | POA: Diagnosis not present

## 2016-01-09 DIAGNOSIS — L03116 Cellulitis of left lower limb: Secondary | ICD-10-CM | POA: Diagnosis not present

## 2016-01-09 DIAGNOSIS — L97219 Non-pressure chronic ulcer of right calf with unspecified severity: Secondary | ICD-10-CM | POA: Diagnosis not present

## 2016-01-09 DIAGNOSIS — I70234 Atherosclerosis of native arteries of right leg with ulceration of heel and midfoot: Secondary | ICD-10-CM | POA: Diagnosis not present

## 2016-01-09 DIAGNOSIS — G35 Multiple sclerosis: Secondary | ICD-10-CM | POA: Diagnosis not present

## 2016-01-09 DIAGNOSIS — L97929 Non-pressure chronic ulcer of unspecified part of left lower leg with unspecified severity: Secondary | ICD-10-CM | POA: Diagnosis not present

## 2016-01-09 DIAGNOSIS — R339 Retention of urine, unspecified: Secondary | ICD-10-CM | POA: Diagnosis not present

## 2016-01-09 DIAGNOSIS — Z792 Long term (current) use of antibiotics: Secondary | ICD-10-CM | POA: Diagnosis not present

## 2016-01-09 DIAGNOSIS — I70232 Atherosclerosis of native arteries of right leg with ulceration of calf: Secondary | ICD-10-CM | POA: Diagnosis not present

## 2016-01-09 DIAGNOSIS — J449 Chronic obstructive pulmonary disease, unspecified: Secondary | ICD-10-CM | POA: Diagnosis not present

## 2016-01-09 DIAGNOSIS — L97419 Non-pressure chronic ulcer of right heel and midfoot with unspecified severity: Secondary | ICD-10-CM | POA: Diagnosis not present

## 2016-01-09 DIAGNOSIS — I70249 Atherosclerosis of native arteries of left leg with ulceration of unspecified site: Secondary | ICD-10-CM | POA: Diagnosis not present

## 2016-01-10 DIAGNOSIS — I70234 Atherosclerosis of native arteries of right leg with ulceration of heel and midfoot: Secondary | ICD-10-CM | POA: Diagnosis not present

## 2016-01-10 DIAGNOSIS — J449 Chronic obstructive pulmonary disease, unspecified: Secondary | ICD-10-CM | POA: Diagnosis not present

## 2016-01-10 DIAGNOSIS — I10 Essential (primary) hypertension: Secondary | ICD-10-CM | POA: Diagnosis not present

## 2016-01-10 DIAGNOSIS — I70249 Atherosclerosis of native arteries of left leg with ulceration of unspecified site: Secondary | ICD-10-CM | POA: Diagnosis not present

## 2016-01-10 DIAGNOSIS — L03116 Cellulitis of left lower limb: Secondary | ICD-10-CM | POA: Diagnosis not present

## 2016-01-10 DIAGNOSIS — L97929 Non-pressure chronic ulcer of unspecified part of left lower leg with unspecified severity: Secondary | ICD-10-CM | POA: Diagnosis not present

## 2016-01-10 DIAGNOSIS — I70232 Atherosclerosis of native arteries of right leg with ulceration of calf: Secondary | ICD-10-CM | POA: Diagnosis not present

## 2016-01-10 DIAGNOSIS — Z792 Long term (current) use of antibiotics: Secondary | ICD-10-CM | POA: Diagnosis not present

## 2016-01-10 DIAGNOSIS — L97419 Non-pressure chronic ulcer of right heel and midfoot with unspecified severity: Secondary | ICD-10-CM | POA: Diagnosis not present

## 2016-01-10 DIAGNOSIS — G35 Multiple sclerosis: Secondary | ICD-10-CM | POA: Diagnosis not present

## 2016-01-10 DIAGNOSIS — L97219 Non-pressure chronic ulcer of right calf with unspecified severity: Secondary | ICD-10-CM | POA: Diagnosis not present

## 2016-01-10 DIAGNOSIS — R339 Retention of urine, unspecified: Secondary | ICD-10-CM | POA: Diagnosis not present

## 2016-01-12 DIAGNOSIS — L97929 Non-pressure chronic ulcer of unspecified part of left lower leg with unspecified severity: Secondary | ICD-10-CM | POA: Diagnosis not present

## 2016-01-12 DIAGNOSIS — I70232 Atherosclerosis of native arteries of right leg with ulceration of calf: Secondary | ICD-10-CM | POA: Diagnosis not present

## 2016-01-12 DIAGNOSIS — G35 Multiple sclerosis: Secondary | ICD-10-CM | POA: Diagnosis not present

## 2016-01-12 DIAGNOSIS — R339 Retention of urine, unspecified: Secondary | ICD-10-CM | POA: Diagnosis not present

## 2016-01-12 DIAGNOSIS — L97219 Non-pressure chronic ulcer of right calf with unspecified severity: Secondary | ICD-10-CM | POA: Diagnosis not present

## 2016-01-12 DIAGNOSIS — J449 Chronic obstructive pulmonary disease, unspecified: Secondary | ICD-10-CM | POA: Diagnosis not present

## 2016-01-12 DIAGNOSIS — I10 Essential (primary) hypertension: Secondary | ICD-10-CM | POA: Diagnosis not present

## 2016-01-12 DIAGNOSIS — I70234 Atherosclerosis of native arteries of right leg with ulceration of heel and midfoot: Secondary | ICD-10-CM | POA: Diagnosis not present

## 2016-01-12 DIAGNOSIS — Z792 Long term (current) use of antibiotics: Secondary | ICD-10-CM | POA: Diagnosis not present

## 2016-01-12 DIAGNOSIS — L97419 Non-pressure chronic ulcer of right heel and midfoot with unspecified severity: Secondary | ICD-10-CM | POA: Diagnosis not present

## 2016-01-12 DIAGNOSIS — I70249 Atherosclerosis of native arteries of left leg with ulceration of unspecified site: Secondary | ICD-10-CM | POA: Diagnosis not present

## 2016-01-12 DIAGNOSIS — L03116 Cellulitis of left lower limb: Secondary | ICD-10-CM | POA: Diagnosis not present

## 2016-01-15 ENCOUNTER — Encounter: Payer: Medicare Other | Attending: Surgery | Admitting: Surgery

## 2016-01-15 DIAGNOSIS — Z79899 Other long term (current) drug therapy: Secondary | ICD-10-CM | POA: Diagnosis not present

## 2016-01-15 DIAGNOSIS — G4733 Obstructive sleep apnea (adult) (pediatric): Secondary | ICD-10-CM | POA: Insufficient documentation

## 2016-01-15 DIAGNOSIS — F419 Anxiety disorder, unspecified: Secondary | ICD-10-CM | POA: Insufficient documentation

## 2016-01-15 DIAGNOSIS — R32 Unspecified urinary incontinence: Secondary | ICD-10-CM | POA: Insufficient documentation

## 2016-01-15 DIAGNOSIS — S81801A Unspecified open wound, right lower leg, initial encounter: Secondary | ICD-10-CM | POA: Diagnosis not present

## 2016-01-15 DIAGNOSIS — G35 Multiple sclerosis: Secondary | ICD-10-CM | POA: Diagnosis not present

## 2016-01-15 DIAGNOSIS — L97212 Non-pressure chronic ulcer of right calf with fat layer exposed: Secondary | ICD-10-CM | POA: Insufficient documentation

## 2016-01-15 DIAGNOSIS — I739 Peripheral vascular disease, unspecified: Secondary | ICD-10-CM | POA: Insufficient documentation

## 2016-01-15 DIAGNOSIS — F17218 Nicotine dependence, cigarettes, with other nicotine-induced disorders: Secondary | ICD-10-CM | POA: Insufficient documentation

## 2016-01-15 DIAGNOSIS — J449 Chronic obstructive pulmonary disease, unspecified: Secondary | ICD-10-CM | POA: Diagnosis not present

## 2016-01-15 DIAGNOSIS — L97222 Non-pressure chronic ulcer of left calf with fat layer exposed: Secondary | ICD-10-CM | POA: Insufficient documentation

## 2016-01-15 DIAGNOSIS — L97512 Non-pressure chronic ulcer of other part of right foot with fat layer exposed: Secondary | ICD-10-CM | POA: Diagnosis not present

## 2016-01-15 DIAGNOSIS — L97821 Non-pressure chronic ulcer of other part of left lower leg limited to breakdown of skin: Secondary | ICD-10-CM | POA: Diagnosis not present

## 2016-01-15 DIAGNOSIS — I872 Venous insufficiency (chronic) (peripheral): Secondary | ICD-10-CM | POA: Diagnosis not present

## 2016-01-15 DIAGNOSIS — I10 Essential (primary) hypertension: Secondary | ICD-10-CM | POA: Insufficient documentation

## 2016-01-15 DIAGNOSIS — Z6841 Body Mass Index (BMI) 40.0 and over, adult: Secondary | ICD-10-CM | POA: Diagnosis not present

## 2016-01-16 DIAGNOSIS — L97219 Non-pressure chronic ulcer of right calf with unspecified severity: Secondary | ICD-10-CM | POA: Diagnosis not present

## 2016-01-16 DIAGNOSIS — L97929 Non-pressure chronic ulcer of unspecified part of left lower leg with unspecified severity: Secondary | ICD-10-CM | POA: Diagnosis not present

## 2016-01-16 DIAGNOSIS — J449 Chronic obstructive pulmonary disease, unspecified: Secondary | ICD-10-CM | POA: Diagnosis not present

## 2016-01-16 DIAGNOSIS — I70249 Atherosclerosis of native arteries of left leg with ulceration of unspecified site: Secondary | ICD-10-CM | POA: Diagnosis not present

## 2016-01-16 DIAGNOSIS — L03116 Cellulitis of left lower limb: Secondary | ICD-10-CM | POA: Diagnosis not present

## 2016-01-16 DIAGNOSIS — Z792 Long term (current) use of antibiotics: Secondary | ICD-10-CM | POA: Diagnosis not present

## 2016-01-16 DIAGNOSIS — G35 Multiple sclerosis: Secondary | ICD-10-CM | POA: Diagnosis not present

## 2016-01-16 DIAGNOSIS — I70232 Atherosclerosis of native arteries of right leg with ulceration of calf: Secondary | ICD-10-CM | POA: Diagnosis not present

## 2016-01-16 DIAGNOSIS — L97419 Non-pressure chronic ulcer of right heel and midfoot with unspecified severity: Secondary | ICD-10-CM | POA: Diagnosis not present

## 2016-01-16 DIAGNOSIS — I70234 Atherosclerosis of native arteries of right leg with ulceration of heel and midfoot: Secondary | ICD-10-CM | POA: Diagnosis not present

## 2016-01-16 DIAGNOSIS — R339 Retention of urine, unspecified: Secondary | ICD-10-CM | POA: Diagnosis not present

## 2016-01-16 DIAGNOSIS — I10 Essential (primary) hypertension: Secondary | ICD-10-CM | POA: Diagnosis not present

## 2016-01-16 NOTE — Progress Notes (Signed)
KENADEE, STEENHOEK ([id number]) Visit Report for 01/15/2016 Arrival Information Details Patient Name: [name], [name] Date of Service: 01/15/2016 2:15 PM Medical Record Number: [id number] Patient Account Number: [id number] Date of Birth/Sex: 05/24/1950 (65 y.o. Female) Treating RN: Cornell Barman Primary Care Physician: LADA, [facility] Other Clinician: Referring Physician: LADA, [place] Treating Physician/Extender: Frann Rider in Treatment: 8 Visit Information History Since Last Visit Added or deleted any medications: No Patient Arrived: Walker Any new allergies or adverse reactions: No Arrival Time: 14:52 Had a fall or experienced change in No Accompanied By: husband activities of daily living that may affect Transfer Assistance: None risk of falls: Patient Identification Verified: Yes Signs or symptoms of abuse/neglect since last No Secondary Verification Process Yes visito Completed: Hospitalized since last visit: No Patient Requires Transmission-Based No Has Dressing in Place as Prescribed: Yes Precautions: Pain Present Now: No Patient Has Alerts: Yes Electronic Signature(s) Signed: 01/15/2016 4:52:58 PM By: Gretta Cool, RN, BSN, Kim RN, BSN Entered By: Gretta Cool, RN, BSN, Kim on 01/15/2016 14:53:10 [name] ([id number]) -------------------------------------------------------------------------------- Encounter Discharge Information Details Patient Name: [name] Date of Service: 01/15/2016 2:15 PM Medical Record Number: [id number] Patient Account Number: [id number] Date of Birth/Sex: 1950/12/14 (65 y.o. Female) Treating RN: Cornell Barman Primary Care Physician: LADA, [facility] Other Clinician: Referring Physician: LADA, [place] Treating Physician/Extender: Frann Rider in Treatment: 8 Encounter Discharge Information Items Discharge Pain Level: 0 Discharge Condition: Stable Ambulatory Status: Walker Discharge Destination: Home Transportation: Private  Auto Accompanied By: self Schedule Follow-up Appointment: Yes Medication Reconciliation completed Yes and provided to Patient/Care Anaisa Radi: Provided on Clinical Summary of Care: 01/15/2016 Form Type Recipient Paper Patient BW Electronic Signature(s) Signed: 01/15/2016 4:52:58 PM By: Gretta Cool RN, BSN, Kim RN, BSN Previous Signature: 01/15/2016 3:30:55 PM Version By: Ruthine Dose Entered By: Gretta Cool RN, BSN, Kim on 01/15/2016 15:36:48 [name] ([id number]) -------------------------------------------------------------------------------- Lower Extremity Assessment Details Patient Name: [name] Date of Service: 01/15/2016 2:15 PM Medical Record Number: [id number] Patient Account Number: [id number] Date of Birth/Sex: 08/29/1950 (65 y.o. Female) Treating RN: Cornell Barman Primary Care Physician: LADA, [facility] Other Clinician: Referring Physician: LADA, [place] Treating Physician/Extender: Frann Rider in Treatment: 8 Vascular Assessment Pulses: Posterior Tibial Dorsalis Pedis Palpable: [Left:Yes] [Right:Yes] Extremity colors, hair growth, and conditions: Extremity Color: [Left:Red] [Right:Red] Hair Growth on Extremity: [Left:No] [Right:No] Temperature of Extremity: [Left:Warm] [Right:Warm] Capillary Refill: [Left:> 3 seconds] [Right:> 3 seconds] Dependent Rubor: [Left:No] Blanched when Elevated: [Left:No] [Right:No] Lipodermatosclerosis: [Left:No] [Right:No] Toe Nail Assessment Left: Right: Thick: Yes Discolored: Yes Yes Deformed: No No Improper Length and Hygiene: No No Electronic Signature(s) Signed: 01/15/2016 4:52:58 PM By: Gretta Cool, RN, BSN, Kim RN, BSN Entered By: Gretta Cool, RN, BSN, Kim on 01/15/2016 14:57:15 [place], [name] ([id number]) -------------------------------------------------------------------------------- Multi Wound Chart Details Patient Name: [name] Date of Service: 01/15/2016 2:15 PM Medical Record Number: [id number] Patient Account  Number: [id number] Date of Birth/Sex: 04-09-1951 (65 y.o. Female) Treating RN: Cornell Barman Primary Care Physician: LADA, [facility] Other Clinician: Referring Physician: LADA, MELINDA Treating Physician/Extender: Frann Rider in Treatment: 8 Vital Signs Height(in): 63 Pulse(bpm): 79 Weight(lbs): 270 Blood Pressure 129/61 (mmHg): Body Mass Index(BMI): 48 Temperature(F): 98.1 Respiratory Rate 20 (breaths/min): Photos: [2:No Photos] [3:No Photos] [4:No Photos] Wound Location: [2:Right Foot - Dorsal] [3:Right Lower Leg] [4:Left, Proximal, Lateral Lower Leg] Wounding Event: [2:Trauma] [3:Trauma] [4:Trauma] Primary Etiology: [2:Trauma, Other] [3:Trauma, Other] [4:Venous Leg Ulcer] Comorbid History: [2:Cataracts, Chronic Obstructive Pulmonary Disease (COPD), Sleep Apnea, Hypertension, Osteoarthritis, Neuropathy] [3:N/A] [4:N/A] Date Acquired: [2:07/21/2015] [3:10/09/2015] [4:10/09/2015] Weeks of  Treatment: [2:8] [3:8] [4:8] Wound Status: [2:Open] [3:Open] [4:Open] Clustered Wound: [2:No] [3:No] [4:Yes] Measurements L x W x D 0.5x1.2x0.1 [3:1.5x1.5x0.2] [4:1.4x1.5x0.2] (cm) Area (cm) : [2:0.471] [3:1.767] [4:1.649] Volume (cm) : [2:0.047] [3:0.353] [4:0.33] % Reduction in Area: [2:-19.80%] [3:-257.00%] [4:-1654.30%] % Reduction in Volume: 40.50% [3:-138.50%] [4:-3566.70%] Classification: [2:Partial Thickness] [3:Partial Thickness] [4:Partial Thickness] Exudate Amount: [2:Large] [3:N/A] [4:N/A] Exudate Type: [2:Serous] [3:N/A] [4:N/A] Exudate Color: [2:amber] [3:N/A] [4:N/A] Wound Margin: [2:Flat and Intact] [3:N/A] [4:N/A] Granulation Amount: [2:None Present (0%)] [3:N/A] [4:N/A] Necrotic Amount: [2:Large (67-100%)] [3:N/A] [4:N/A] Exposed Structures: [2:Fascia: No Fat: No Tendon: No] [3:N/A] [4:N/A] Muscle: No Joint: No Bone: No Limited to Skin Breakdown Epithelialization: None N/A N/A Periwound Skin Texture: Edema: Yes No Abnormalities Noted No Abnormalities  Noted Excoriation: No Induration: No Callus: No Crepitus: No Fluctuance: No Friable: No Rash: No Scarring: No Periwound Skin Maceration: No No Abnormalities Noted No Abnormalities Noted Moisture: Moist: No Dry/Scaly: No Periwound Skin Color: Ecchymosis: Yes No Abnormalities Noted No Abnormalities Noted Atrophie Blanche: No Cyanosis: No Erythema: No Hemosiderin Staining: No Mottled: No Pallor: No Rubor: No Tenderness on No No No Palpation: Wound Preparation: Ulcer Cleansing: N/A N/A Rinsed/Irrigated with Saline Topical Anesthetic Applied: Other: lidocaine 4% Wound Number: 5 6 N/A Photos: No Photos No Photos N/A Wound Location: Left, Distal, Lateral Lower Left, Anterior Lower Leg N/A Leg Wounding Event: Gradually Appeared Gradually Appeared N/A Primary Etiology: Venous Leg Ulcer Venous Leg Ulcer N/A Comorbid History: N/A N/A N/A Date Acquired: 12/21/2015 12/21/2015 N/A Weeks of Treatment: 3 3 N/A Wound Status: Open Open N/A Clustered Wound: No No N/A Measurements L x W x D 1.2x1x0.2 2.2x4x0.1 N/A (cm) Area (cm) : 0.942 6.912 N/A Goecke, Remingtyn H. ([id number]) Volume (cm) : 0.188 0.691 N/A % Reduction in Area: -71.30% -1470.90% N/A % Reduction in Volume: -241.80% -1470.50% N/A Classification: Partial Thickness Partial Thickness N/A Exudate Amount: N/A N/A N/A Exudate Type: N/A N/A N/A Exudate Color: N/A N/A N/A Wound Margin: N/A N/A N/A Granulation Amount: N/A N/A N/A Necrotic Amount: N/A N/A N/A Exposed Structures: N/A N/A N/A Epithelialization: N/A N/A N/A Periwound Skin Texture: No Abnormalities Noted No Abnormalities Noted N/A Periwound Skin No Abnormalities Noted No Abnormalities Noted N/A Moisture: Periwound Skin Color: No Abnormalities Noted No Abnormalities Noted N/A Tenderness on No No N/A Palpation: Wound Preparation: N/A N/A N/A Treatment Notes Electronic Signature(s) Signed: 01/15/2016 4:52:58 PM By: Gretta Cool, RN, BSN, Kim RN, BSN Entered By:  Gretta Cool, RN, BSN, Kim on 01/15/2016 15:17:14 [name] ([id number]) -------------------------------------------------------------------------------- Multi-Disciplinary Care Plan Details Patient Name: [name] Date of Service: 01/15/2016 2:15 PM Medical Record Number: [id number] Patient Account Number: [id number] Date of Birth/Sex: 1951/02/24 (65 y.o. Female) Treating RN: Cornell Barman Primary Care Physician: LADA, [facility] Other Clinician: Referring Physician: LADA, MELINDA Treating Physician/Extender: Frann Rider in Treatment: 8 Active Inactive Abuse / Safety / Falls / Self Care Management Nursing Diagnoses: Impaired physical mobility Potential for falls Goals: Patient will remain injury free Date Initiated: 11/20/2015 Goal Status: Active Interventions: Assess fall risk on admission and as needed Notes: Nutrition Nursing Diagnoses: Imbalanced nutrition Goals: Patient/caregiver agrees to and verbalizes understanding of need to use nutritional supplements and/or vitamins as prescribed Date Initiated: 11/20/2015 Goal Status: Active Interventions: Provide education on nutrition Notes: Orientation to the Wound Care Program Nursing Diagnoses: Knowledge deficit related to the wound healing center program Goals: VICTORIA, MATTHIAS ([id number]) Patient/caregiver will verbalize understanding of the [facility] Date Initiated: 11/20/2015 Goal Status: Active Interventions: Provide education on orientation to  the wound center Notes: Wound/Skin Impairment Nursing Diagnoses: Impaired tissue integrity Goals: Patient will demonstrate a reduced rate of smoking or cessation of smoking Date Initiated: 11/20/2015 Goal Status: Active Ulcer/skin breakdown will heal within 14 weeks Date Initiated: 11/20/2015 Goal Status: Active Interventions: Assess patient/caregiver ability to obtain necessary supplies Provide education on smoking Notes: Electronic  Signature(s) Signed: 01/15/2016 4:52:58 PM By: Gretta Cool, RN, BSN, Kim RN, BSN Entered By: Gretta Cool, RN, BSN, Kim on 01/15/2016 15:16:23 [name] ([id number]) -------------------------------------------------------------------------------- Pain Assessment Details Patient Name: [name] Date of Service: 01/15/2016 2:15 PM Medical Record Number: [id number] Patient Account Number: [id number] Date of Birth/Sex: [birth year] (65 y.o. Female) Treating RN: Cornell Barman Primary Care Physician: LADA, [facility] Other Clinician: Referring Physician: LADA, [place] Treating Physician/Extender: Frann Rider in Treatment: 8 Active Problems Location of Pain Severity and Description of Pain Patient Has Paino No Site Locations With Dressing Change: No Pain Management and Medication Current Pain Management: Electronic Signature(s) Signed: 01/15/2016 4:52:58 PM By: Gretta Cool, RN, BSN, Kim RN, BSN Entered By: Gretta Cool, RN, BSN, Kim on 01/15/2016 14:53:30 [name] ([id number]) -------------------------------------------------------------------------------- Patient/Caregiver Education Details Patient Name: [name] Date of Service: 01/15/2016 2:15 PM Medical Record Number: [id number] Patient Account Number: [id number] Date of Birth/Gender: 23-Nov-1950 (65 y.o. Female) Treating RN: Cornell Barman Primary Care Physician: LADA, [facility] Other Clinician: Referring Physician: LADA, [place] Treating Physician/Extender: Frann Rider in Treatment: 8 Education Assessment Education Provided To: Patient Education Topics Provided Wound/Skin Impairment: Handouts: Caring for Your Ulcer Methods: Demonstration Responses: State content correctly Electronic Signature(s) Signed: 01/15/2016 4:52:58 PM By: Gretta Cool, RN, BSN, Kim RN, BSN Entered By: Gretta Cool, RN, BSN, Kim on 01/15/2016 15:36:59 [name] ([id number]) -------------------------------------------------------------------------------- Wound  Assessment Details Patient Name: [name] Date of Service: 01/15/2016 2:15 PM Medical Record Number: [id number] Patient Account Number: [id number] Date of Birth/Sex: 1951-04-14 (65 y.o. Female) Treating RN: Cornell Barman Primary Care Physician: LADA, [place] Other Clinician: Referring Physician: LADA, MELINDA Treating Physician/Extender: Frann Rider in Treatment: 8 Wound Status Wound Number: 2 Primary Trauma, Other Etiology: Wound Location: Right Foot - Dorsal Wound Open Wounding Event: Trauma Status: Date Acquired: 07/21/2015 Comorbid Cataracts, Chronic Obstructive Weeks Of Treatment: 8 History: Pulmonary Disease (COPD), Sleep Clustered Wound: No Apnea, Hypertension, Osteoarthritis, Neuropathy Photos Photo Uploaded By: Gretta Cool, RN, BSN, Kim on 01/15/2016 16:22:03 Wound Measurements Length: (cm) 0.5 Width: (cm) 1.2 Depth: (cm) 0.1 Area: (cm) 0.471 Volume: (cm) 0.047 % Reduction in Area: -19.8% % Reduction in Volume: 40.5% Epithelialization: None Wound Description Classification: Partial Thickness Wound Margin: Flat and Intact Exudate Amount: Large Exudate Type: Serous Exudate Color: amber Wound Bed Granulation Amount: None Present (0%) Exposed Structure Necrotic Amount: Large (67-100%) Fascia Exposed: No Necrotic Quality: Adherent Slough Fat Layer Exposed: No Tendon Exposed: No Muscle Exposed: No Chopra, [place] H. ([id number]) Joint Exposed: No Bone Exposed: No Limited to Skin Breakdown Periwound Skin Texture Texture Color No Abnormalities Noted: No No Abnormalities Noted: No Callus: No Atrophie Blanche: No Crepitus: No Cyanosis: No Excoriation: No Ecchymosis: Yes Fluctuance: No Erythema: No Friable: No Hemosiderin Staining: No Induration: No Mottled: No Localized Edema: Yes Pallor: No Rash: No Rubor: No Scarring: No Moisture No Abnormalities Noted: No Dry / Scaly: No Maceration: No Moist: No Wound Preparation Ulcer Cleansing:  Rinsed/Irrigated with Saline Topical Anesthetic Applied: Other: lidocaine 4%, Treatment Notes Wound #2 (Right, Dorsal Foot) 1. Cleansed with: Clean wound with Normal Saline 2. Anesthetic Topical Lidocaine 4% cream to wound bed prior to debridement 4. Dressing Applied: Santyl Ointment  5. Secondary Dressing Applied Dry Gauze Notes coban Electronic Signature(s) Signed: 01/15/2016 4:52:58 PM By: Gretta Cool, RN, BSN, Kim RN, BSN Entered By: Gretta Cool, RN, BSN, Kim on 01/15/2016 15:16:06 [name] ([id number]) -------------------------------------------------------------------------------- Wound Assessment Details Patient Name: [name] Date of Service: 01/15/2016 2:15 PM Medical Record Number: [id number] Patient Account Number: [id number] Date of Birth/Sex: 15-May-1950 (65 y.o. Female) Treating RN: Cornell Barman Primary Care Physician: LADA, [place] Other Clinician: Referring Physician: LADA, MELINDA Treating Physician/Extender: Frann Rider in Treatment: 8 Wound Status Wound Number: 3 Primary Etiology: Trauma, Other Wound Location: Right Lower Leg Wound Status: Open Wounding Event: Trauma Date Acquired: 10/09/2015 Weeks Of Treatment: 8 Clustered Wound: No Photos Photo Uploaded By: Gretta Cool, RN, BSN, Kim on 01/15/2016 16:22:04 Wound Measurements Length: (cm) 1.5 Width: (cm) 1.5 Depth: (cm) 0.2 Area: (cm) 1.767 Volume: (cm) 0.353 % Reduction in Area: -257% % Reduction in Volume: -138.5% Wound Description Classification: Partial Thickness Periwound Skin Texture Texture Color No Abnormalities Noted: No No Abnormalities Noted: No Moisture No Abnormalities Noted: No Treatment Notes Wound #3 (Right Lower Leg) 1. Cleansed with: Clean wound with Normal Saline 2. Anesthetic Salmela, Huldah H. ([id number]) Topical Lidocaine 4% cream to wound bed prior to debridement 4. Dressing Applied: Santyl Ointment 5. Secondary Dressing Applied Dry Gauze Notes coban Electronic  Signature(s) Signed: 01/15/2016 4:52:58 PM By: Gretta Cool, RN, BSN, Kim RN, BSN Entered By: Gretta Cool, RN, BSN, Kim on 01/15/2016 15:00:35 [name] ([id number]) -------------------------------------------------------------------------------- Wound Assessment Details Patient Name: [name] Date of Service: 01/15/2016 2:15 PM Medical Record Number: [id number] Patient Account Number: [id number] Date of Birth/Sex: April 28, 1951 (65 y.o. Female) Treating RN: Cornell Barman Primary Care Physician: LADA, [place] Other Clinician: Referring Physician: LADA, MELINDA Treating Physician/Extender: Frann Rider in Treatment: 8 Wound Status Wound Number: 4 Primary Etiology: Venous Leg Ulcer Wound Location: Left, Proximal, Lateral Lower Wound Status: Open Leg Wounding Event: Trauma Date Acquired: 10/09/2015 Weeks Of Treatment: 8 Clustered Wound: No Photos Photo Uploaded By: Gretta Cool, RN, BSN, Kim on 01/15/2016 16:22:23 Wound Measurements Length: (cm) 1.4 Width: (cm) 1.5 Depth: (cm) 0.2 Area: (cm) 1.649 Volume: (cm) 0.33 % Reduction in Area: -1654.3% % Reduction in Volume: -3566.7% Wound Description Classification: Partial Thickness Periwound Skin Texture Texture Color No Abnormalities Noted: No No Abnormalities Noted: No Moisture No Abnormalities Noted: No Treatment Notes Wound #4 (Left, Proximal, Lateral Lower Leg) 1. Cleansed with: Clean wound with Normal Saline Inga, Dao H. ([id number]) 2. Anesthetic Topical Lidocaine 4% cream to wound bed prior to debridement 4. Dressing Applied: Santyl Ointment 5. Secondary Dressing Applied Dry Gauze Notes coban Electronic Signature(s) Signed: 01/15/2016 4:52:58 PM By: Gretta Cool, RN, BSN, Kim RN, BSN Entered By: Gretta Cool, RN, BSN, Kim on 01/15/2016 15:02:54 [name] ([id number]) -------------------------------------------------------------------------------- Wound Assessment Details Patient Name: [name] Date of Service:  01/15/2016 2:15 PM Medical Record Number: [id number] Patient Account Number: [id number] Date of Birth/Sex: 08/20/1950 (65 y.o. Female) Treating RN: Cornell Barman Primary Care Physician: LADA, [facility] Other Clinician: Referring Physician: LADA, MELINDA Treating Physician/Extender: Frann Rider in Treatment: 8 Wound Status Wound Number: 5 Primary Etiology: Venous Leg Ulcer Wound Location: Left, Distal, Lateral Lower Leg Wound Status: Open Wounding Event: Gradually Appeared Date Acquired: 12/21/2015 Weeks Of Treatment: 3 Clustered Wound: No Photos Photo Uploaded By: Gretta Cool, RN, BSN, Kim on 01/15/2016 16:22:23 Wound Measurements Length: (cm) 1.2 Width: (cm) 1 Depth: (cm) 0.2 Area: (cm) 0.942 Volume: (cm) 0.188 % Reduction in Area: -71.3% % Reduction in Volume: -241.8% Wound Description Classification: Partial Thickness Periwound  Skin Texture Texture Color No Abnormalities Noted: No No Abnormalities Noted: No Moisture No Abnormalities Noted: No Treatment Notes Wound #5 (Left, Distal, Lateral Lower Leg) 1. Cleansed with: Clean wound with Normal Saline 2. Anesthetic Colt, Tenecia H. ([id number]) Topical Lidocaine 4% cream to wound bed prior to debridement 4. Dressing Applied: Santyl Ointment 5. Secondary Dressing Applied Dry Gauze Notes coban Electronic Signature(s) Signed: 01/15/2016 4:52:58 PM By: Gretta Cool, RN, BSN, Kim RN, BSN Entered By: Gretta Cool, RN, BSN, Kim on 01/15/2016 15:02:54 [name] ([id number]) -------------------------------------------------------------------------------- Wound Assessment Details Patient Name: [name] Date of Service: 01/15/2016 2:15 PM Medical Record Number: [id number] Patient Account Number: [id number] Date of Birth/Sex: 01-04-1951 (65 y.o. Female) Treating RN: Cornell Barman Primary Care Physician: LADA, [place] Other Clinician: Referring Physician: LADA, MELINDA Treating Physician/Extender: Frann Rider in Treatment:  8 Wound Status Wound Number: 6 Primary Etiology: Venous Leg Ulcer Wound Location: Left, Anterior Lower Leg Wound Status: Open Wounding Event: Gradually Appeared Date Acquired: 12/21/2015 Weeks Of Treatment: 3 Clustered Wound: No Photos Photo Uploaded By: Gretta Cool, RN, BSN, Kim on 01/15/2016 16:22:36 Wound Measurements Length: (cm) 2.2 Width: (cm) 4 Depth: (cm) 0.1 Area: (cm) 6.912 Volume: (cm) 0.691 % Reduction in Area: -1470.9% % Reduction in Volume: -1470.5% Wound Description Classification: Partial Thickness Periwound Skin Texture Texture Color No Abnormalities Noted: No No Abnormalities Noted: No Moisture No Abnormalities Noted: No Treatment Notes Wound #6 (Left, Anterior Lower Leg) 1. Cleansed with: Clean wound with Normal Saline 2. Anesthetic Meckley, Lessa H. ([id number]) Topical Lidocaine 4% cream to wound bed prior to debridement 4. Dressing Applied: Santyl Ointment 5. Secondary Dressing Applied Dry Gauze Notes coban Electronic Signature(s) Signed: 01/15/2016 4:52:58 PM By: Gretta Cool, RN, BSN, Kim RN, BSN Entered By: Gretta Cool, RN, BSN, Kim on 01/15/2016 15:02:55 [name] ([id number]) -------------------------------------------------------------------------------- Vitals Details Patient Name: [name] Date of Service: 01/15/2016 2:15 PM Medical Record Number: [id number] Patient Account Number: [id number] Date of Birth/Sex: [birth year] (65 y.o. Female) Treating RN: Cornell Barman Primary Care Physician: LADA, [place] Other Clinician: Referring Physician: LADA, [place] Treating Physician/Extender: Frann Rider in Treatment: 8 Vital Signs Time Taken: 14:53 Temperature (F): 98.1 Height (in): 63 Pulse (bpm): 79 Weight (lbs): 270 Respiratory Rate (breaths/min): 20 Body Mass Index (BMI): 47.8 Blood Pressure (mmHg): 129/61 Reference Range: 80 - 120 mg / dl Electronic Signature(s) Signed: 01/15/2016 4:52:58 PM By: Gretta Cool, RN, BSN, Kim RN,  BSN Entered By: Gretta Cool, RN, BSN, Kim on 01/15/2016 14:55:45

## 2016-01-16 NOTE — Progress Notes (Signed)
CHARNAY, SHUEY ([id number]) Visit Report for 01/15/2016 Chief Complaint Document Details Patient Name: [name], [name] Date of Service: 01/15/2016 2:15 PM Medical Record Number: [id number] Patient Account Number: [id number] Date of Birth/Sex: Jul 04, 1950 (66 y.o. Female) Treating RN: Cornell Barman Primary Care Physician: LADA, [facility] Other Clinician: Referring Physician: LADA, [place] Treating Physician/Extender: Frann Rider in Treatment: 8 Information Obtained from: Patient Chief Complaint Patient seen for complaints of Non-Healing Wound to the left and right lower extremity and the right dorsum of the foot for about 6 months now Electronic Signature(s) Signed: 01/15/2016 3:27:43 PM By: Christin Fudge MD, FACS Entered By: Christin Fudge on 01/15/2016 15:27:42 [name] -------------------------------------------------------------------------------- Debridement Details Patient Name: [name] Date of Service: 01/15/2016 2:15 PM Medical Record Number: [id number] Patient Account Number: [id number] Date of Birth/Sex: 07/06/1950 (65 y.o. Female) Treating RN: Cornell Barman Primary Care Physician: LADA, [place] Other Clinician: Referring Physician: LADA, [place] Treating Physician/Extender: Frann Rider in Treatment: 8 Debridement Performed for Wound #3 Right Lower Leg Assessment: Performed By: Physician Christin Fudge, MD Debridement: Debridement Pre-procedure Yes - 15:15 Verification/Time Out Taken: Start Time: 15:16 Pain Control: Other : lidocaine 4% Level: Skin/Subcutaneous Tissue Total Area Debrided (L x 1.5 (cm) x 1.5 (cm) = 2.25 (cm) W): Tissue and other Viable, Non-Viable, Exudate, Fibrin/Slough, Subcutaneous material debrided: Instrument: Curette Bleeding: Minimum Hemostasis Achieved: Pressure End Time: 15:18 Procedural Pain: 0 Post Procedural Pain: 0 Response to Treatment: Procedure was tolerated well Post Debridement Measurements of  Total Wound Length: (cm) 1.5 Width: (cm) 1.5 Depth: (cm) 0.2 Volume: (cm) 0.353 Character of Wound/Ulcer Post Requires Further Debridement Debridement: Severity of Tissue Post Debridement: Fat layer exposed Post Procedure Diagnosis Same as Pre-procedure Electronic Signature(s) Signed: 01/15/2016 3:26:48 PM By: Christin Fudge MD, FACS Signed: 01/15/2016 4:52:58 PM By: Gretta Cool RN, BSN, Kim RN, BSN Entered By: Christin Fudge on 01/15/2016 15:26:48 CUMA, KOPITZKE ([id number] [name] ([id number]) -------------------------------------------------------------------------------- Debridement Details Patient Name: [name] Date of Service: 01/15/2016 2:15 PM Medical Record Number: [id number] Patient Account Number: [id number] Date of Birth/Sex: 1950-11-29 (65 y.o. Female) Treating RN: Cornell Barman Primary Care Physician: LADA, [place] Other Clinician: Referring Physician: LADA, [place] Treating Physician/Extender: Frann Rider in Treatment: 8 Debridement Performed for Wound #4 Left,Proximal,Lateral Lower Leg Assessment: Performed By: Physician Christin Fudge, MD Debridement: Debridement Pre-procedure Yes - 15:15 Verification/Time Out Taken: Start Time: 15:16 Pain Control: Other : lidocaine 4% Level: Skin/Subcutaneous Tissue Total Area Debrided (L x 1.4 (cm) x 1.5 (cm) = 2.1 (cm) W): Tissue and other Viable, Non-Viable, Exudate, Fibrin/Slough, Subcutaneous material debrided: Instrument: Curette Bleeding: Minimum Hemostasis Achieved: Pressure End Time: 15:18 Procedural Pain: 0 Post Procedural Pain: 0 Response to Treatment: Procedure was tolerated well Post Debridement Measurements of Total Wound Length: (cm) 1.4 Width: (cm) 1.5 Depth: (cm) 0.2 Volume: (cm) 0.33 Character of Wound/Ulcer Post Requires Further Debridement Debridement: Severity of Tissue Post Debridement: Fat layer exposed Post Procedure Diagnosis Same as Pre-procedure Electronic  Signature(s) Signed: 01/15/2016 3:27:08 PM By: Christin Fudge MD, FACS Signed: 01/15/2016 4:52:58 PM By: Gretta Cool RN, BSN, Kim RN, BSN Entered By: Christin Fudge on 01/15/2016 15:27:07 SERAH, CIFARELLI ([id number]) AHNIA, BRUNEAU ([id number]) -------------------------------------------------------------------------------- Debridement Details Patient Name: [name] Date of Service: 01/15/2016 2:15 PM Medical Record Number: [id number] Patient Account Number: [id number] Date of Birth/Sex: [birth year] (65 y.o. Female) Treating RN: Cornell Barman Primary Care Physician: LADA, [facility] Other Clinician: Referring Physician: LADA, MELINDA Treating Physician/Extender: Frann Rider in Treatment: 8 Debridement Performed for Wound #5  Left,Distal,Lateral Lower Leg Assessment: Performed By: Physician Christin Fudge, MD Debridement: Debridement Pre-procedure Yes - 15:15 Verification/Time Out Taken: Start Time: 15:16 Pain Control: Other : lidocaine 4% Level: Skin/Subcutaneous Tissue Total Area Debrided (L x 1.2 (cm) x 1 (cm) = 1.2 (cm) W): Tissue and other Non-Viable, Exudate, Fibrin/Slough, Subcutaneous material debrided: Instrument: Curette Bleeding: Minimum Hemostasis Achieved: Pressure End Time: 15:18 Procedural Pain: 0 Post Procedural Pain: 0 Response to Treatment: Procedure was tolerated well Post Debridement Measurements of Total Wound Length: (cm) 1.2 Width: (cm) 1 Depth: (cm) 0.2 Volume: (cm) 0.188 Character of Wound/Ulcer Post Requires Further Debridement Debridement: Severity of Tissue Post Debridement: Fat layer exposed Post Procedure Diagnosis Same as Pre-procedure Electronic Signature(s) Signed: 01/15/2016 3:27:28 PM By: Christin Fudge MD, FACS Signed: 01/15/2016 4:52:58 PM By: Gretta Cool RN, BSN, Kim RN, BSN Entered By: Christin Fudge on 01/15/2016 15:27:28 TERAN, SCHUTTLER ([id number]) ALEXUIS, PERSICO  ([id number]) -------------------------------------------------------------------------------- HPI Details Patient Name: [name] Date of Service: 01/15/2016 2:15 PM Medical Record Number: [id number] Patient Account Number: [id number] Date of Birth/Sex: February 18, 1951 (65 y.o. Female) Treating RN: Cornell Barman Primary Care Physician: LADA, [facility] Other Clinician: Referring Physician: LADA, MELINDA Treating Physician/Extender: Frann Rider in Treatment: 8 History of Present Illness Location: left and right lower extremity and dorsum of the right foot Quality: Patient reports experiencing a sharp pain to affected area(s). Severity: Patient states wound are getting worse. Duration: Patient has had the wound for > 6 months prior to seeking treatment at the wound center Timing: Pain in wound is Intermittent (comes and goes Context: The wound appeared gradually over time Modifying Factors: Other treatment(s) tried include: symptomatic treatment as advised by her PCP Associated Signs and Symptoms: Patient reports having increase swelling. HPI Description: 65 year old with a past medical history significant for MS, urinary incontinence, and obesity. She has been seen in the wound clinic before for lower extremity ulcerations treated with compression therapy. she is also known to have hypertension, peripheral vascular disease, COPD, obstructive sleep apnea, lumbar radiculopathy, kyphoscoliosis, urinary issues and tobacco abuse. Smokes a packet of cigarettes a day was recently seen at the [facility] for swelling of her legs and feet with a ulceration on the dorsum of the right foot which has been there for about 6 months. she was recently in the ER about a month ago where she was seen for shortness of breath and swelling of the legs and a chest x-ray was within normal limits had an increase in her BNP and was given Lasix and put on doxycycline for a mild cellulitis and  possible UTI. Wounds aresmaller today 11/13/15. Debrided and will continue Santyl. 12/21/2015 -- she was admitted to the hospital overnight on 12/18/2015 and diagnosed with urinary retention and cellulitis of the left lower leg. is past to take clindamycin and use Santyl for her wounds. 01/15/2016 -- come back to see [place] for almost a month and continues to be noncompliant with her dressings Electronic Signature(s) Signed: 01/15/2016 3:28:19 PM By: Christin Fudge MD, FACS Entered By: Christin Fudge on 01/15/2016 15:28:19 [name] ([id number]) -------------------------------------------------------------------------------- Physical Exam Details Patient Name: [name] Date of Service: 01/15/2016 2:15 PM Medical Record Number: [id number] Patient Account Number: [id number] Date of Birth/Sex: 04-28-1951 (65 y.o. Female) Treating RN: Cornell Barman Primary Care Physician: LADA, [facility] Other Clinician: Referring Physician: LADA, MELINDA Treating Physician/Extender: Frann Rider in Treatment: 8 Constitutional . Pulse regular. Respirations normal and unlabored. Afebrile. . Eyes Nonicteric. Reactive to light. Ears, Nose, Mouth, and  Throat Lips, teeth, and gums WNL.Marland Kitchen Moist mucosa without lesions. Neck supple and nontender. No palpable supraclavicular or cervical adenopathy. Normal sized without goiter. Respiratory WNL. No retractions.. Cardiovascular Pedal Pulses WNL. No clubbing, cyanosis or edema. Lymphatic No adneopathy. No adenopathy. No adenopathy. Musculoskeletal Adexa without tenderness or enlargement.. Digits and nails w/o clubbing, cyanosis, infection, petechiae, ischemia, or inflammatory conditions.. Integumentary (Hair, Skin) No suspicious lesions. No crepitus or fluctuance. No peri-wound warmth or erythema. No masses.Marland Kitchen Psychiatric Judgement and insight Intact.. No evidence of depression, anxiety, or agitation.. Notes debridement was done, with a #3 curet, to the  wounds on right lower extremity and her left lateral leg. He can amount of slough was removed and there was minimal bleeding controlled with pressure. Electronic Signature(s) Signed: 01/15/2016 3:29:23 PM By: Christin Fudge MD, FACS Entered By: Christin Fudge on 01/15/2016 15:29:22 [name] -------------------------------------------------------------------------------- Physician Orders Details Patient Name: [name] Date of Service: 01/15/2016 2:15 PM Medical Record Number: [id number] Patient Account Number: [id number] Date of Birth/Sex: 29-Oct-1950 (65 y.o. Female) Treating RN: Cornell Barman Primary Care Physician: LADA, [facility] Other Clinician: Referring Physician: LADA, MELINDA Treating Physician/Extender: Frann Rider in Treatment: 8 Verbal / Phone Orders: Yes Clinician: Cornell Barman Read Back and Verified: Yes Diagnosis Coding Wound Cleansing Wound #2 Right,Dorsal Foot o May Shower, gently pat wound dry prior to applying new dressing. Wound #3 Right Lower Leg o May Shower, gently pat wound dry prior to applying new dressing. Wound #4 Left,Proximal,Lateral Lower Leg o May Shower, gently pat wound dry prior to applying new dressing. Wound #5 Left,Distal,Lateral Lower Leg o May Shower, gently pat wound dry prior to applying new dressing. Wound #6 Left,Anterior Lower Leg o May Shower, gently pat wound dry prior to applying new dressing. Anesthetic Wound #2 Right,Dorsal Foot o Topical Lidocaine 4% cream applied to wound bed prior to debridement - in clinic only Wound #3 Right Lower Leg o Topical Lidocaine 4% cream applied to wound bed prior to debridement - in clinic only Wound #4 Left,Proximal,Lateral Lower Leg o Topical Lidocaine 4% cream applied to wound bed prior to debridement - in clinic only Wound #5 Left,Distal,Lateral Lower Leg o Topical Lidocaine 4% cream applied to wound bed prior to debridement - in clinic only Wound #6  Left,Anterior Lower Leg o Topical Lidocaine 4% cream applied to wound bed prior to debridement - in clinic only Skin Barriers/Peri-Wound Care Wound #2 Right,Dorsal Foot o Skin Prep Wound #3 Right Lower Leg Gellatly, Megahn H. ([id number]) o Skin Prep Wound #4 Left,Proximal,Lateral Lower Leg o Skin Prep Wound #5 Left,Distal,Lateral Lower Leg o Skin Prep Wound #6 Left,Anterior Lower Leg o Skin Prep Primary Wound Dressing Wound #2 Right,Dorsal Foot o Santyl Ointment o Medihoney gel - When you run out of santyl Wound #3 Right Lower Leg o Santyl Ointment o Medihoney gel - When you run out of santyl Wound #4 Left,Proximal,Lateral Lower Leg o Santyl Ointment o Medihoney gel - When you run out of santyl Wound #5 Left,Distal,Lateral Lower Leg o Santyl Ointment o Medihoney gel - When you run out of santyl Wound #6 Left,Anterior Lower Leg o Santyl Ointment o Medihoney gel - When you run out of santyl Secondary Dressing Wound #2 Right,Dorsal Foot o Dry Gauze o Boardered Foam Dressing Wound #3 Right Lower Leg o Dry Gauze o Boardered Foam Dressing Wound #4 Left,Proximal,Lateral Lower Leg o Dry Gauze o Boardered Foam Dressing Wound #5 Left,Distal,Lateral Lower Leg o Dry Gauze o Boardered Foam Dressing Merry, Lashane H. ([id number])  Wound #6 Left,Anterior Lower Leg o Dry Gauze o Boardered Foam Dressing Dressing Change Frequency Wound #2 Right,Dorsal Foot o Change dressing every day. Wound #3 Right Lower Leg o Change dressing every day. Wound #4 Left,Proximal,Lateral Lower Leg o Change dressing every day. Wound #5 Left,Distal,Lateral Lower Leg o Change dressing every day. Wound #6 Left,Anterior Lower Leg o Change dressing every day. Follow-up Appointments Wound #2 Right,Dorsal Foot o Return Appointment in 1 week. Wound #3 Right Lower Leg o Return Appointment in 1 week. Wound #4 Left,Proximal,Lateral Lower Leg o  Return Appointment in 1 week. Wound #5 Left,Distal,Lateral Lower Leg o Return Appointment in 1 week. Wound #6 Left,Anterior Lower Leg o Return Appointment in 1 week. Edema Control Wound #2 Right,Dorsal Foot o Patient to wear own compression stockings o Elevate legs to the level of the heart and pump ankles as often as possible Wound #3 Right Lower Leg o Patient to wear own compression stockings o Elevate legs to the level of the heart and pump ankles as often as possible Wound #4 Left,Proximal,Lateral Lower Leg o Patient to wear own compression stockings Pettis, Mabell H. ([id number]) o Elevate legs to the level of the heart and pump ankles as often as possible Wound #5 Left,Distal,Lateral Lower Leg o Patient to wear own compression stockings o Elevate legs to the level of the heart and pump ankles as often as possible Wound #6 Left,Anterior Lower Leg o Patient to wear own compression stockings o Elevate legs to the level of the heart and pump ankles as often as possible Additional Orders / Instructions Wound #2 Right,Dorsal Foot o Stop Smoking o Increase protein intake. Wound #3 Right Lower Leg o Stop Smoking o Increase protein intake. Wound #4 Left,Proximal,Lateral Lower Leg o Stop Smoking o Increase protein intake. Wound #5 Left,Distal,Lateral Lower Leg o Stop Smoking o Increase protein intake. Wound #6 Left,Anterior Lower Leg o Stop Smoking o Increase protein intake. Electronic Signature(s) Signed: 01/15/2016 4:10:09 PM By: Christin Fudge MD, FACS Signed: 01/15/2016 4:52:58 PM By: Gretta Cool RN, BSN, Kim RN, BSN Entered By: Gretta Cool, RN, BSN, Kim on 01/15/2016 15:21:36 CECILA, HACKBARTH ([id number]) -------------------------------------------------------------------------------- Problem List Details Patient Name: [name] Date of Service: 01/15/2016 2:15 PM Medical Record Number: [id number] Patient Account Number: [id number] Date of  Birth/Sex: [birth year] (65 y.o. 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 11/20/2015 Yes exposed L97.222 Non-pressure chronic ulcer of left calf with fat layer 11/20/2015 Yes exposed F17.218 Nicotine dependence, cigarettes, with other nicotine- 11/20/2015 Yes induced disorders Inactive Problems Resolved Problems Electronic Signature(s) Signed: 01/15/2016 3:26:20 PM By: Christin Fudge MD, FACS Entered By: Christin Fudge on 01/15/2016 15:26:19 [name] ([id number]) -------------------------------------------------------------------------------- Progress Note Details Patient Name: [name] Date of Service: 01/15/2016 2:15 PM Medical Record Number: [id number] Patient Account Number: [id number] Date of Birth/Sex: [birth year] (65 y.o. Female) Treating RN: Cornell Barman Primary Care Physician: LADA, [facility] Other Clinician: Referring Physician: LADA, MELINDA Treating Physician/Extender: Frann Rider in Treatment: 8 Subjective Chief Complaint Information obtained from Patient Patient seen for complaints of Non-Healing Wound to the left and right lower extremity and the right dorsum of  the foot for about 6 months now History of Present Illness (HPI) The following HPI elements were documented for the patient's wound: Location: left and right lower extremity and dorsum of the right foot Quality: Patient reports experiencing a sharp pain to affected area(s). Severity: Patient states wound are getting  worse. Duration: Patient has had the wound for > 6 months prior to seeking treatment at the wound center Timing: Pain in wound is Intermittent (comes and goes Context: The wound appeared gradually over time Modifying Factors: Other treatment(s) tried include: symptomatic treatment as advised by her PCP Associated Signs and Symptoms: Patient reports having increase swelling. 65 year old with a past medical history significant for MS, urinary incontinence, and obesity. She has been seen in the wound clinic before for lower extremity ulcerations treated with compression therapy. she is also known to have hypertension, peripheral vascular disease, COPD, obstructive sleep apnea, lumbar radiculopathy, kyphoscoliosis, urinary issues and tobacco abuse. Smokes a packet of cigarettes a day was recently seen at the [facility] for swelling of her legs and feet with a ulceration on the dorsum of the right foot which has been there for about 6 months. she was recently in the ER about a month ago where she was seen for shortness of breath and swelling of the legs and a chest x-ray was within normal limits had an increase in her BNP and was given Lasix and put on doxycycline for a mild cellulitis and possible UTI. Wounds aresmaller today 11/13/15. Debrided and will continue Santyl. 12/21/2015 -- she was admitted to the hospital overnight on 12/18/2015 and diagnosed with urinary retention and cellulitis of the left lower leg. is past to take clindamycin and use Santyl for her wounds. 01/15/2016 -- come back to see [place] for almost a month and continues to be noncompliant with her dressings Objective Morlock, Xitlalic H. ([id number]) Constitutional Pulse regular. Respirations normal and unlabored. Afebrile. Vitals Time Taken: 2:53 PM, Height: 63 in, Weight: 270 lbs, BMI: 47.8, Temperature: 98.1 F, Pulse: 79 bpm, Respiratory Rate: 20 breaths/min, Blood Pressure: 129/61 mmHg. Eyes Nonicteric. Reactive  to light. Ears, Nose, Mouth, and Throat Lips, teeth, and gums WNL.Marland Kitchen Moist mucosa without lesions. Neck supple and nontender. No palpable supraclavicular or cervical adenopathy. Normal sized without goiter. Respiratory WNL. No retractions.. Cardiovascular Pedal Pulses WNL. No clubbing, cyanosis or edema. Lymphatic No adneopathy. No adenopathy. No adenopathy. Musculoskeletal Adexa without tenderness or enlargement.. Digits and nails w/o clubbing, cyanosis, infection, petechiae, ischemia, or inflammatory conditions.Marland Kitchen Psychiatric Judgement and insight Intact.. No evidence of depression, anxiety, or agitation.. General Notes: debridement was done, with a #3 curet, to the wounds on right lower extremity and her left lateral leg. He can amount of slough was removed and there was minimal bleeding controlled with pressure. Integumentary (Hair, Skin) No suspicious lesions. No crepitus or fluctuance. No peri-wound warmth or erythema. No masses.. Wound #2 status is Open. Original cause of wound was Trauma. The wound is located on the Right,Dorsal Foot. The wound measures 0.5cm length x 1.2cm width x 0.1cm depth; 0.471cm^2 area and 0.047cm^3 volume. The wound is limited to skin breakdown. There is a large amount of serous drainage noted. The wound margin is flat and intact. There is no granulation within the wound bed. There is a large (67-100%) amount of necrotic tissue within the wound bed including Adherent Slough. The periwound skin appearance exhibited: Localized Edema, Ecchymosis. The periwound skin appearance did not exhibit: Callus, Crepitus, Excoriation, Fluctuance, Friable, Induration, Rash, Scarring, Dry/Scaly, Maceration, Moist, Atrophie  Blanche, Cyanosis, Hemosiderin Staining, Mottled, Pallor, Rubor, Erythema. Wound #3 status is Open. Original cause of wound was Trauma. The wound is located on the Right Lower Leg. The wound measures 1.5cm length x 1.5cm width x 0.2cm depth; 1.767cm^2  area and 0.353cm^3 Chao, Pamla H. ([id number]) volume. Wound #4 status is Open. Original cause of wound was Trauma. The wound is located on the Left,Proximal,Lateral Lower Leg. The wound measures 1.4cm length x 1.5cm width x 0.2cm depth; 1.649cm^2 area and 0.33cm^3 volume. Wound #5 status is Open. Original cause of wound was Gradually Appeared. The wound is located on the Left,Distal,Lateral Lower Leg. The wound measures 1.2cm length x 1cm width x 0.2cm depth; 0.942cm^2 area and 0.188cm^3 volume. Wound #6 status is Open. Original cause of wound was Gradually Appeared. The wound is located on the Left,Anterior Lower Leg. The wound measures 2.2cm length x 4cm width x 0.1cm depth; 6.912cm^2 area and 0.691cm^3 volume. 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 There was a Skin/Subcutaneous Tissue Debridement [id number] debridement with total area of 2.25 sq cm performed by Christin Fudge, MD. with the following instrument(s): Curette to remove Viable and Non-Viable tissue/material including Exudate, Fibrin/Slough, and Subcutaneous after achieving pain control using Other (lidocaine 4%). A time out was conducted at 15:15, prior to the start of the procedure. A Minimum amount of bleeding was controlled with Pressure. The procedure was tolerated well with a pain level of 0 throughout and a pain level of 0 following the procedure. Post Debridement Measurements: 1.5cm length x 1.5cm width x 0.2cm depth; 0.353cm^3 volume. Character  of Wound/Ulcer Post Debridement requires further debridement. Severity of Tissue Post Debridement is: Fat layer exposed. Post procedure Diagnosis Wound #3: Same as Pre-Procedure Lewinski, Taeya H. ([id number]) Wound #4 Wound #4 is a Venous Leg Ulcer located on the Left,Proximal,Lateral Lower Leg . There was a Skin/Subcutaneous Tissue Debridement [id number] debridement with total area of 2.1 sq cm performed by Christin Fudge, MD. with the following instrument(s): Curette to remove Viable and Non-Viable tissue/material including Exudate, Fibrin/Slough, and Subcutaneous after achieving pain control using Other (lidocaine 4%). A time out was conducted at 15:15, prior to the start of the procedure. A Minimum amount of bleeding was controlled with Pressure. The procedure was tolerated well with a pain level of 0 throughout and a pain level of 0 following the procedure. Post Debridement Measurements: 1.4cm length x 1.5cm width x 0.2cm depth; 0.33cm^3 volume. Character of Wound/Ulcer Post Debridement requires further debridement. Severity of Tissue Post Debridement is: Fat layer exposed. Post procedure Diagnosis Wound #4: Same as Pre-Procedure Wound #5 Wound #5 is a Venous Leg Ulcer located on the Left,Distal,Lateral Lower Leg . There was a Skin/Subcutaneous Tissue Debridement [id number]) debridement with total area of 1.2 sq cm performed by Christin Fudge, MD. with the following instrument(s): Curette to remove Non-Viable tissue/material including Exudate, Fibrin/Slough, and Subcutaneous after achieving pain control using Other (lidocaine 4%). A time out was conducted at 15:15, prior to the start of the procedure. A Minimum amount of bleeding was controlled with Pressure. The procedure was tolerated well with  a pain level of 0 throughout and a pain level of 0 following the procedure. Post Debridement Measurements: 1.2cm length x 1cm width x 0.2cm depth; 0.188cm^3 volume. Character of Wound/Ulcer  Post Debridement requires further debridement. Severity of Tissue Post Debridement is: Fat layer exposed. Post procedure Diagnosis Wound #5: Same as Pre-Procedure Plan Wound Cleansing: Wound #2 Right,Dorsal Foot: May Shower, gently pat wound dry prior to applying new dressing. Wound #3 Right Lower Leg: May Shower, gently pat wound dry prior to applying new dressing. Wound #4 Left,Proximal,Lateral Lower Leg: May Shower, gently pat wound dry prior to applying new dressing. Wound #5 Left,Distal,Lateral Lower Leg: May Shower, gently pat wound dry prior to applying new dressing. Wound #6 Left,Anterior Lower Leg: May Shower, gently pat wound dry prior to applying new dressing. Anesthetic: Wound #2 Right,Dorsal Foot: Topical Lidocaine 4% cream applied to wound bed prior to debridement - in clinic only Wound #3 Right Lower Leg: Topical Lidocaine 4% cream applied to wound bed prior to debridement - in clinic only Romain, Latanja H. ([id number]) Wound #4 Left,Proximal,Lateral Lower Leg: Topical Lidocaine 4% cream applied to wound bed prior to debridement - in clinic only Wound #5 Left,Distal,Lateral Lower Leg: Topical Lidocaine 4% cream applied to wound bed prior to debridement - in clinic only Wound #6 Left,Anterior Lower Leg: Topical Lidocaine 4% cream applied to wound bed prior to debridement - in clinic only Skin Barriers/Peri-Wound Care: Wound #2 Right,Dorsal Foot: Skin Prep Wound #3 Right Lower Leg: Skin Prep Wound #4 Left,Proximal,Lateral Lower Leg: Skin Prep Wound #5 Left,Distal,Lateral Lower Leg: Skin Prep Wound #6 Left,Anterior Lower Leg: Skin Prep Primary Wound Dressing: Wound #2 Right,Dorsal Foot: Santyl Ointment Medihoney gel - When you run out of santyl Wound #3 Right Lower Leg: Santyl Ointment Medihoney gel - When you run out of santyl Wound #4 Left,Proximal,Lateral Lower Leg: Santyl Ointment Medihoney gel - When you run out of santyl Wound #5 Left,Distal,Lateral  Lower Leg: Santyl Ointment Medihoney gel - When you run out of santyl Wound #6 Left,Anterior Lower Leg: Santyl Ointment Medihoney gel - When you run out of santyl Secondary Dressing: Wound #2 Right,Dorsal Foot: Dry Gauze Boardered Foam Dressing Wound #3 Right Lower Leg: Dry Gauze Boardered Foam Dressing Wound #4 Left,Proximal,Lateral Lower Leg: Dry Gauze Boardered Foam Dressing Wound #5 Left,Distal,Lateral Lower Leg: Dry Gauze Boardered Foam Dressing Wound #6 Left,Anterior Lower Leg: Dry Gauze Boardered Foam Dressing Dressing Change Frequency: Wound #2 Right,Dorsal Foot: Obara, Marrion H. ([id number]) Change dressing every day. Wound #3 Right Lower Leg: Change dressing every day. Wound #4 Left,Proximal,Lateral Lower Leg: Change dressing every day. Wound #5 Left,Distal,Lateral Lower Leg: Change dressing every day. Wound #6 Left,Anterior Lower Leg: Change dressing every day. Follow-up Appointments: Wound #2 Right,Dorsal Foot: Return Appointment in 1 week. Wound #3 Right Lower Leg: Return Appointment in 1 week. Wound #4 Left,Proximal,Lateral Lower Leg: Return Appointment in 1 week. Wound #5 Left,Distal,Lateral Lower Leg: Return Appointment in 1 week. Wound #6 Left,Anterior Lower Leg: Return Appointment in 1 week. Edema Control: Wound #2 Right,Dorsal Foot: Patient to wear own compression stockings Elevate legs to the level of the heart and pump ankles as often as possible Wound #3 Right Lower Leg: Patient to wear own compression stockings Elevate legs to the level of the heart and pump ankles as often as possible Wound #4 Left,Proximal,Lateral Lower Leg: Patient to wear own compression stockings Elevate legs to the level of the heart and pump ankles as often as possible Wound #5 Left,Distal,Lateral Lower Leg: Patient to wear  own compression stockings Elevate legs to the level of the heart and pump ankles as often as possible Wound #6 Left,Anterior Lower  Leg: Patient to wear own compression stockings Elevate legs to the level of the heart and pump ankles as often as possible Additional Orders / Instructions: Wound #2 Right,Dorsal Foot: Stop Smoking Increase protein intake. Wound #3 Right Lower Leg: Stop Smoking Increase protein intake. Wound #4 Left,Proximal,Lateral Lower Leg: Stop Smoking Increase protein intake. Wound #5 Left,Distal,Lateral Lower Leg: Stop Smoking Increase protein intake. Wound #6 Left,Anterior Lower Leg: Stop Smoking Vazques, Danille H. ([id number]) Increase protein intake. I have recommended: 1. Using Santyl over the wound and applying a bordered foam over this. 2. washing her legs well with soap and water and making sure she does her dressing as advised 3. as a significant amount of lymphedema and I have recommended elevation, exercise and wearing compression stockings, which she says she already has 4. Seeing her back for regular visits Electronic Signature(s) Signed: 01/15/2016 3:31:52 PM By: Christin Fudge MD, FACS Entered By: Christin Fudge on 01/15/2016 15:31:52 [name] ([id number]) -------------------------------------------------------------------------------- SuperBill Details Patient Name: [name] Date of Service: 01/15/2016 Medical Record Number: [id number] Patient Account Number: [id number] Date of Birth/Sex: 20-Apr-1951 (65 y.o. 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 calf with fat layer exposed F17.218 Nicotine  dependence, cigarettes, with other nicotine-induced disorders Facility Procedures CPT4 Code Description: [id number] - DEB SUBQ TISSUE 20 SQ CM/< ICD-10 Description Diagnosis L97.512 Non-pressure chronic ulcer of other part of right fo L97.212 Non-pressure chronic ulcer of right calf with fat la L97.222 Non-pressure chronic ulcer  of left calf with fat lay Modifier: ot with fat la yer exposed er exposed Quantity: 1 yer exposed Physician Procedures CPT4 Code Description: [id number] 11042 - WC PHYS SUBQ TISS 20 SQ CM ICD-10 Description Diagnosis L97.512 Non-pressure chronic ulcer of other part of right fo L97.212 Non-pressure chronic ulcer of right calf with fat la L97.222 Non-pressure chronic ulcer of  left calf with fat lay Modifier: ot with fat lay yer exposed er exposed Quantity: 1 er exposed Electronic Signature(s) Signed: 01/15/2016 3:32:14 PM By: Christin Fudge MD, FACS Entered By: Christin Fudge on 01/15/2016 15:32:13

## 2016-01-17 ENCOUNTER — Other Ambulatory Visit: Payer: Self-pay | Admitting: Family Medicine

## 2016-01-17 DIAGNOSIS — L97219 Non-pressure chronic ulcer of right calf with unspecified severity: Secondary | ICD-10-CM | POA: Diagnosis not present

## 2016-01-17 DIAGNOSIS — L97929 Non-pressure chronic ulcer of unspecified part of left lower leg with unspecified severity: Secondary | ICD-10-CM | POA: Diagnosis not present

## 2016-01-17 DIAGNOSIS — G35 Multiple sclerosis: Secondary | ICD-10-CM | POA: Diagnosis not present

## 2016-01-17 DIAGNOSIS — I70232 Atherosclerosis of native arteries of right leg with ulceration of calf: Secondary | ICD-10-CM | POA: Diagnosis not present

## 2016-01-17 DIAGNOSIS — L03116 Cellulitis of left lower limb: Secondary | ICD-10-CM | POA: Diagnosis not present

## 2016-01-17 DIAGNOSIS — I10 Essential (primary) hypertension: Secondary | ICD-10-CM | POA: Diagnosis not present

## 2016-01-17 DIAGNOSIS — J449 Chronic obstructive pulmonary disease, unspecified: Secondary | ICD-10-CM | POA: Diagnosis not present

## 2016-01-17 DIAGNOSIS — I70249 Atherosclerosis of native arteries of left leg with ulceration of unspecified site: Secondary | ICD-10-CM | POA: Diagnosis not present

## 2016-01-17 DIAGNOSIS — Z792 Long term (current) use of antibiotics: Secondary | ICD-10-CM | POA: Diagnosis not present

## 2016-01-17 DIAGNOSIS — L97419 Non-pressure chronic ulcer of right heel and midfoot with unspecified severity: Secondary | ICD-10-CM | POA: Diagnosis not present

## 2016-01-17 DIAGNOSIS — I70234 Atherosclerosis of native arteries of right leg with ulceration of heel and midfoot: Secondary | ICD-10-CM | POA: Diagnosis not present

## 2016-01-17 DIAGNOSIS — R339 Retention of urine, unspecified: Secondary | ICD-10-CM | POA: Diagnosis not present

## 2016-01-19 DIAGNOSIS — I10 Essential (primary) hypertension: Secondary | ICD-10-CM | POA: Diagnosis not present

## 2016-01-19 DIAGNOSIS — L97219 Non-pressure chronic ulcer of right calf with unspecified severity: Secondary | ICD-10-CM | POA: Diagnosis not present

## 2016-01-19 DIAGNOSIS — L03116 Cellulitis of left lower limb: Secondary | ICD-10-CM | POA: Diagnosis not present

## 2016-01-19 DIAGNOSIS — I70232 Atherosclerosis of native arteries of right leg with ulceration of calf: Secondary | ICD-10-CM | POA: Diagnosis not present

## 2016-01-19 DIAGNOSIS — G35 Multiple sclerosis: Secondary | ICD-10-CM | POA: Diagnosis not present

## 2016-01-19 DIAGNOSIS — Z792 Long term (current) use of antibiotics: Secondary | ICD-10-CM | POA: Diagnosis not present

## 2016-01-19 DIAGNOSIS — I70234 Atherosclerosis of native arteries of right leg with ulceration of heel and midfoot: Secondary | ICD-10-CM | POA: Diagnosis not present

## 2016-01-19 DIAGNOSIS — R339 Retention of urine, unspecified: Secondary | ICD-10-CM | POA: Diagnosis not present

## 2016-01-19 DIAGNOSIS — I70249 Atherosclerosis of native arteries of left leg with ulceration of unspecified site: Secondary | ICD-10-CM | POA: Diagnosis not present

## 2016-01-19 DIAGNOSIS — L97929 Non-pressure chronic ulcer of unspecified part of left lower leg with unspecified severity: Secondary | ICD-10-CM | POA: Diagnosis not present

## 2016-01-19 DIAGNOSIS — L97419 Non-pressure chronic ulcer of right heel and midfoot with unspecified severity: Secondary | ICD-10-CM | POA: Diagnosis not present

## 2016-01-19 DIAGNOSIS — J449 Chronic obstructive pulmonary disease, unspecified: Secondary | ICD-10-CM | POA: Diagnosis not present

## 2016-01-22 ENCOUNTER — Ambulatory Visit: Payer: Medicare Other | Admitting: Surgery

## 2016-01-22 DIAGNOSIS — I70232 Atherosclerosis of native arteries of right leg with ulceration of calf: Secondary | ICD-10-CM | POA: Diagnosis not present

## 2016-01-22 DIAGNOSIS — L97929 Non-pressure chronic ulcer of unspecified part of left lower leg with unspecified severity: Secondary | ICD-10-CM | POA: Diagnosis not present

## 2016-01-22 DIAGNOSIS — I70234 Atherosclerosis of native arteries of right leg with ulceration of heel and midfoot: Secondary | ICD-10-CM | POA: Diagnosis not present

## 2016-01-22 DIAGNOSIS — L97419 Non-pressure chronic ulcer of right heel and midfoot with unspecified severity: Secondary | ICD-10-CM | POA: Diagnosis not present

## 2016-01-22 DIAGNOSIS — Z792 Long term (current) use of antibiotics: Secondary | ICD-10-CM | POA: Diagnosis not present

## 2016-01-22 DIAGNOSIS — I10 Essential (primary) hypertension: Secondary | ICD-10-CM | POA: Diagnosis not present

## 2016-01-22 DIAGNOSIS — L03116 Cellulitis of left lower limb: Secondary | ICD-10-CM | POA: Diagnosis not present

## 2016-01-22 DIAGNOSIS — I70249 Atherosclerosis of native arteries of left leg with ulceration of unspecified site: Secondary | ICD-10-CM | POA: Diagnosis not present

## 2016-01-22 DIAGNOSIS — R339 Retention of urine, unspecified: Secondary | ICD-10-CM | POA: Diagnosis not present

## 2016-01-22 DIAGNOSIS — J449 Chronic obstructive pulmonary disease, unspecified: Secondary | ICD-10-CM | POA: Diagnosis not present

## 2016-01-22 DIAGNOSIS — L97219 Non-pressure chronic ulcer of right calf with unspecified severity: Secondary | ICD-10-CM | POA: Diagnosis not present

## 2016-01-22 DIAGNOSIS — G35 Multiple sclerosis: Secondary | ICD-10-CM | POA: Diagnosis not present

## 2016-01-24 ENCOUNTER — Telehealth: Payer: Self-pay | Admitting: Family Medicine

## 2016-01-24 DIAGNOSIS — L97929 Non-pressure chronic ulcer of unspecified part of left lower leg with unspecified severity: Secondary | ICD-10-CM | POA: Diagnosis not present

## 2016-01-24 DIAGNOSIS — J449 Chronic obstructive pulmonary disease, unspecified: Secondary | ICD-10-CM | POA: Diagnosis not present

## 2016-01-24 DIAGNOSIS — R339 Retention of urine, unspecified: Secondary | ICD-10-CM | POA: Diagnosis not present

## 2016-01-24 DIAGNOSIS — L97219 Non-pressure chronic ulcer of right calf with unspecified severity: Secondary | ICD-10-CM | POA: Diagnosis not present

## 2016-01-24 DIAGNOSIS — G35 Multiple sclerosis: Secondary | ICD-10-CM | POA: Diagnosis not present

## 2016-01-24 DIAGNOSIS — I70249 Atherosclerosis of native arteries of left leg with ulceration of unspecified site: Secondary | ICD-10-CM | POA: Diagnosis not present

## 2016-01-24 DIAGNOSIS — I70232 Atherosclerosis of native arteries of right leg with ulceration of calf: Secondary | ICD-10-CM | POA: Diagnosis not present

## 2016-01-24 DIAGNOSIS — I70234 Atherosclerosis of native arteries of right leg with ulceration of heel and midfoot: Secondary | ICD-10-CM | POA: Diagnosis not present

## 2016-01-24 DIAGNOSIS — L03116 Cellulitis of left lower limb: Secondary | ICD-10-CM | POA: Diagnosis not present

## 2016-01-24 DIAGNOSIS — I10 Essential (primary) hypertension: Secondary | ICD-10-CM | POA: Diagnosis not present

## 2016-01-24 DIAGNOSIS — L97419 Non-pressure chronic ulcer of right heel and midfoot with unspecified severity: Secondary | ICD-10-CM | POA: Diagnosis not present

## 2016-01-24 DIAGNOSIS — Z792 Long term (current) use of antibiotics: Secondary | ICD-10-CM | POA: Diagnosis not present

## 2016-01-24 NOTE — Telephone Encounter (Signed)
[name] from [facility] states patient has finished her antibiotics but has redness to her lower extremities.  [phone]

## 2016-01-24 NOTE — Telephone Encounter (Signed)
Please ask her to contact her wound care doctor; thank her for letting us know, but they are running the show with her cellulitis and ulcers; thank you

## 2016-01-25 NOTE — Telephone Encounter (Signed)
[facility] called stating [facility] in [place] is patients pharmacy.

## 2016-01-25 NOTE — Telephone Encounter (Signed)
Please see my previous note; ask them to call her wound care doctor; I'm not managing her cellulitis

## 2016-01-26 ENCOUNTER — Encounter: Payer: Self-pay | Admitting: Family Medicine

## 2016-01-26 DIAGNOSIS — L97219 Non-pressure chronic ulcer of right calf with unspecified severity: Secondary | ICD-10-CM | POA: Diagnosis not present

## 2016-01-26 DIAGNOSIS — I70249 Atherosclerosis of native arteries of left leg with ulceration of unspecified site: Secondary | ICD-10-CM | POA: Diagnosis not present

## 2016-01-26 DIAGNOSIS — L97929 Non-pressure chronic ulcer of unspecified part of left lower leg with unspecified severity: Secondary | ICD-10-CM | POA: Diagnosis not present

## 2016-01-26 DIAGNOSIS — Z792 Long term (current) use of antibiotics: Secondary | ICD-10-CM | POA: Diagnosis not present

## 2016-01-26 DIAGNOSIS — L97419 Non-pressure chronic ulcer of right heel and midfoot with unspecified severity: Secondary | ICD-10-CM | POA: Diagnosis not present

## 2016-01-26 DIAGNOSIS — I70234 Atherosclerosis of native arteries of right leg with ulceration of heel and midfoot: Secondary | ICD-10-CM | POA: Diagnosis not present

## 2016-01-26 DIAGNOSIS — R339 Retention of urine, unspecified: Secondary | ICD-10-CM | POA: Diagnosis not present

## 2016-01-26 DIAGNOSIS — J449 Chronic obstructive pulmonary disease, unspecified: Secondary | ICD-10-CM | POA: Diagnosis not present

## 2016-01-26 DIAGNOSIS — G35 Multiple sclerosis: Secondary | ICD-10-CM | POA: Diagnosis not present

## 2016-01-26 DIAGNOSIS — I10 Essential (primary) hypertension: Secondary | ICD-10-CM | POA: Diagnosis not present

## 2016-01-26 DIAGNOSIS — L03116 Cellulitis of left lower limb: Secondary | ICD-10-CM | POA: Diagnosis not present

## 2016-01-26 DIAGNOSIS — I70232 Atherosclerosis of native arteries of right leg with ulceration of calf: Secondary | ICD-10-CM | POA: Diagnosis not present

## 2016-01-26 NOTE — Telephone Encounter (Signed)
Lattie Haw notified to contact wound care

## 2016-01-29 ENCOUNTER — Other Ambulatory Visit: Payer: Self-pay

## 2016-01-29 DIAGNOSIS — I70249 Atherosclerosis of native arteries of left leg with ulceration of unspecified site: Secondary | ICD-10-CM | POA: Diagnosis not present

## 2016-01-29 DIAGNOSIS — R339 Retention of urine, unspecified: Secondary | ICD-10-CM | POA: Diagnosis not present

## 2016-01-29 DIAGNOSIS — J449 Chronic obstructive pulmonary disease, unspecified: Secondary | ICD-10-CM | POA: Diagnosis not present

## 2016-01-29 DIAGNOSIS — L97219 Non-pressure chronic ulcer of right calf with unspecified severity: Secondary | ICD-10-CM | POA: Diagnosis not present

## 2016-01-29 DIAGNOSIS — L97419 Non-pressure chronic ulcer of right heel and midfoot with unspecified severity: Secondary | ICD-10-CM | POA: Diagnosis not present

## 2016-01-29 DIAGNOSIS — I10 Essential (primary) hypertension: Secondary | ICD-10-CM

## 2016-01-29 DIAGNOSIS — Z792 Long term (current) use of antibiotics: Secondary | ICD-10-CM | POA: Diagnosis not present

## 2016-01-29 DIAGNOSIS — G35 Multiple sclerosis: Secondary | ICD-10-CM | POA: Diagnosis not present

## 2016-01-29 DIAGNOSIS — L03116 Cellulitis of left lower limb: Secondary | ICD-10-CM | POA: Diagnosis not present

## 2016-01-29 DIAGNOSIS — I70232 Atherosclerosis of native arteries of right leg with ulceration of calf: Secondary | ICD-10-CM | POA: Diagnosis not present

## 2016-01-29 DIAGNOSIS — I70234 Atherosclerosis of native arteries of right leg with ulceration of heel and midfoot: Secondary | ICD-10-CM | POA: Diagnosis not present

## 2016-01-29 DIAGNOSIS — L97929 Non-pressure chronic ulcer of unspecified part of left lower leg with unspecified severity: Secondary | ICD-10-CM | POA: Diagnosis not present

## 2016-01-29 MED ORDER — HYDROCHLOROTHIAZIDE 12.5 MG PO CAPS: 12.5000 mg | ORAL_CAPSULE | Freq: Every day | ORAL | 0 refills | Status: DC

## 2016-01-29 NOTE — Telephone Encounter (Signed)
Please let pt know that with her recent slight decline in her kidney function, I'm going to stop her triamterene/hctz and just use lower dose of hctz; monitor BP at home and call if going over [id number] systolic

## 2016-01-30 ENCOUNTER — Encounter: Payer: Medicare Other | Attending: Physician Assistant | Admitting: Physician Assistant

## 2016-01-30 DIAGNOSIS — L97212 Non-pressure chronic ulcer of right calf with fat layer exposed: Secondary | ICD-10-CM | POA: Diagnosis not present

## 2016-01-30 DIAGNOSIS — S81801A Unspecified open wound, right lower leg, initial encounter: Secondary | ICD-10-CM | POA: Diagnosis not present

## 2016-01-30 DIAGNOSIS — G35 Multiple sclerosis: Secondary | ICD-10-CM | POA: Diagnosis not present

## 2016-01-30 DIAGNOSIS — F17218 Nicotine dependence, cigarettes, with other nicotine-induced disorders: Secondary | ICD-10-CM | POA: Diagnosis not present

## 2016-01-30 DIAGNOSIS — L97222 Non-pressure chronic ulcer of left calf with fat layer exposed: Secondary | ICD-10-CM | POA: Insufficient documentation

## 2016-01-30 DIAGNOSIS — L97822 Non-pressure chronic ulcer of other part of left lower leg with fat layer exposed: Secondary | ICD-10-CM | POA: Diagnosis not present

## 2016-01-30 DIAGNOSIS — F419 Anxiety disorder, unspecified: Secondary | ICD-10-CM | POA: Diagnosis not present

## 2016-01-30 DIAGNOSIS — G4733 Obstructive sleep apnea (adult) (pediatric): Secondary | ICD-10-CM | POA: Insufficient documentation

## 2016-01-30 DIAGNOSIS — L97512 Non-pressure chronic ulcer of other part of right foot with fat layer exposed: Secondary | ICD-10-CM | POA: Diagnosis not present

## 2016-01-30 DIAGNOSIS — I739 Peripheral vascular disease, unspecified: Secondary | ICD-10-CM | POA: Diagnosis not present

## 2016-01-30 DIAGNOSIS — Z6841 Body Mass Index (BMI) 40.0 and over, adult: Secondary | ICD-10-CM | POA: Diagnosis not present

## 2016-01-30 DIAGNOSIS — Z79899 Other long term (current) drug therapy: Secondary | ICD-10-CM | POA: Diagnosis not present

## 2016-01-30 DIAGNOSIS — I87312 Chronic venous hypertension (idiopathic) with ulcer of left lower extremity: Secondary | ICD-10-CM | POA: Diagnosis not present

## 2016-01-30 DIAGNOSIS — J449 Chronic obstructive pulmonary disease, unspecified: Secondary | ICD-10-CM | POA: Diagnosis not present

## 2016-01-30 DIAGNOSIS — I10 Essential (primary) hypertension: Secondary | ICD-10-CM | POA: Diagnosis not present

## 2016-01-30 DIAGNOSIS — R32 Unspecified urinary incontinence: Secondary | ICD-10-CM | POA: Insufficient documentation

## 2016-01-30 DIAGNOSIS — S91301A Unspecified open wound, right foot, initial encounter: Secondary | ICD-10-CM | POA: Diagnosis not present

## 2016-01-30 NOTE — Telephone Encounter (Signed)
Patient notified

## 2016-01-31 DIAGNOSIS — I10 Essential (primary) hypertension: Secondary | ICD-10-CM | POA: Diagnosis not present

## 2016-01-31 DIAGNOSIS — I70234 Atherosclerosis of native arteries of right leg with ulceration of heel and midfoot: Secondary | ICD-10-CM | POA: Diagnosis not present

## 2016-01-31 DIAGNOSIS — I70249 Atherosclerosis of native arteries of left leg with ulceration of unspecified site: Secondary | ICD-10-CM | POA: Diagnosis not present

## 2016-01-31 DIAGNOSIS — L97419 Non-pressure chronic ulcer of right heel and midfoot with unspecified severity: Secondary | ICD-10-CM | POA: Diagnosis not present

## 2016-01-31 DIAGNOSIS — L97219 Non-pressure chronic ulcer of right calf with unspecified severity: Secondary | ICD-10-CM | POA: Diagnosis not present

## 2016-01-31 DIAGNOSIS — R339 Retention of urine, unspecified: Secondary | ICD-10-CM | POA: Diagnosis not present

## 2016-01-31 DIAGNOSIS — I70232 Atherosclerosis of native arteries of right leg with ulceration of calf: Secondary | ICD-10-CM | POA: Diagnosis not present

## 2016-01-31 DIAGNOSIS — L03116 Cellulitis of left lower limb: Secondary | ICD-10-CM | POA: Diagnosis not present

## 2016-01-31 DIAGNOSIS — L97929 Non-pressure chronic ulcer of unspecified part of left lower leg with unspecified severity: Secondary | ICD-10-CM | POA: Diagnosis not present

## 2016-01-31 DIAGNOSIS — Z792 Long term (current) use of antibiotics: Secondary | ICD-10-CM | POA: Diagnosis not present

## 2016-01-31 DIAGNOSIS — G35 Multiple sclerosis: Secondary | ICD-10-CM | POA: Diagnosis not present

## 2016-01-31 DIAGNOSIS — J449 Chronic obstructive pulmonary disease, unspecified: Secondary | ICD-10-CM | POA: Diagnosis not present

## 2016-02-01 ENCOUNTER — Encounter: Payer: Self-pay | Admitting: Family Medicine

## 2016-02-01 ENCOUNTER — Ambulatory Visit (INDEPENDENT_AMBULATORY_CARE_PROVIDER_SITE_OTHER): Payer: Medicare Other | Admitting: Family Medicine

## 2016-02-01 DIAGNOSIS — L98492 Non-pressure chronic ulcer of skin of other sites with fat layer exposed: Secondary | ICD-10-CM

## 2016-02-01 DIAGNOSIS — L03116 Cellulitis of left lower limb: Secondary | ICD-10-CM | POA: Diagnosis not present

## 2016-02-01 DIAGNOSIS — J441 Chronic obstructive pulmonary disease with (acute) exacerbation: Secondary | ICD-10-CM | POA: Diagnosis not present

## 2016-02-01 DIAGNOSIS — I10 Essential (primary) hypertension: Secondary | ICD-10-CM | POA: Diagnosis not present

## 2016-02-01 NOTE — Progress Notes (Addendum)
RACHAEL, CARR ([id number]) Visit Report for 01/30/2016 Chief Complaint Document Details Patient Name: TORSHA, GILLS Date of Service: 01/30/2016 3:30 PM Medical Record Number: [id number] Patient Account Number: [id number] Date of Birth/Sex: [birth year] (65 y.o. Female) Treating RN: Montey Hora Primary Care Physician: LADA, [place] Other Clinician: Referring Physician: LADA, [place] Treating Physician/Extender: STONE III, HOYT Weeks in Treatment: 10 Information Obtained from: Patient Chief Complaint Patient seen for complaints of Non-Healing Wound to the left and right lower extremity and the right dorsum of the foot Electronic Signature(s) Signed: 02/01/2016 1:37:32 AM By: Worthy Keeler PA-C Entered By: Worthy Keeler on 01/30/2016 16:54:11 [name] ([id number]) -------------------------------------------------------------------------------- Debridement Details Patient Name: [name] Date of Service: 01/30/2016 3:30 PM Medical Record Number: [id number] Patient Account Number: [id number] Date of Birth/Sex: 1950/09/07 (65 y.o. Female) Treating RN: Montey Hora Primary Care Physician: LADA, [place] Other Clinician: Referring Physician: LADA, [place] Treating Physician/Extender: STONE III, HOYT Weeks in Treatment: 10 Debridement Performed for Wound #4 Left,Proximal,Lateral Lower Leg Assessment: Performed By: Physician STONE III, HOYT, Debridement: Debridement Pre-procedure Yes - 16:31 Verification/Time Out Taken: Start Time: 16:31 Pain Control: Lidocaine 4% Topical Solution Level: Skin/Subcutaneous Tissue Total Area Debrided (L x 5 (cm) x 5 (cm) = 25 (cm) W): Tissue and other Viable, Non-Viable, Fibrin/Slough, Subcutaneous material debrided: Instrument: Curette Bleeding: Minimum Hemostasis Achieved: Pressure End Time: 16:35 Procedural Pain: 0 Post Procedural Pain: 0 Response to Treatment: Procedure was tolerated well Post Debridement Measurements of  Total Wound Length: (cm) 7 Width: (cm) 7 Depth: (cm) 0.3 Volume: (cm) 11.545 Character of Wound/Ulcer Post Improved Debridement: Severity of Tissue Post Debridement: Fat layer exposed Post Procedure Diagnosis Same as Pre-procedure Electronic Signature(s) Signed: 01/30/2016 5:47:08 PM By: Montey Hora Signed: 02/01/2016 1:37:32 AM By: Worthy Keeler PA-C Entered By: Montey Hora on 01/30/2016 16:35:24 Crespi, [name] ([name] ([id number]) -------------------------------------------------------------------------------- Debridement Details Patient Name: [name] Date of Service: 01/30/2016 3:30 PM Medical Record Number: [id number] Patient Account Number: [id number] Date of Birth/Sex: 12-07-1950 (65 y.o. Female) Treating RN: Montey Hora Primary Care Physician: LADA, [place] Other Clinician: Referring Physician: LADA, [place] Treating Physician/Extender: STONE III, HOYT Weeks in Treatment: 10 Debridement Performed for Wound #3 Right Lower Leg Assessment: Performed By: Physician STONE III, HOYT, Debridement: Debridement Pre-procedure Yes - 16:35 Verification/Time Out Taken: Start Time: 16:35 Pain Control: Lidocaine 4% Topical Solution Level: Skin/Subcutaneous Tissue Total Area Debrided (L x 2 (cm) x 1.8 (cm) = 3.6 (cm) W): Tissue and other Viable, Non-Viable, Fibrin/Slough, Subcutaneous material debrided: Instrument: Curette Bleeding: Minimum Hemostasis Achieved: Pressure End Time: 16:38 Procedural Pain: 0 Post Procedural Pain: 0 Response to Treatment: Procedure was tolerated well Post Debridement Measurements of Total Wound Length: (cm) 2 Width: (cm) 1.8 Depth: (cm) 0.2 Volume: (cm) 0.565 Character of Wound/Ulcer Post Improved Debridement: Severity of Tissue Post Debridement: Fat layer exposed Post Procedure Diagnosis Same as Pre-procedure Electronic Signature(s) Signed: 01/30/2016 5:47:08 PM By: Montey Hora Signed:  02/01/2016 1:37:32 AM By: Worthy Keeler PA-C Entered By: Montey Hora on 01/30/2016 16:36:44 Mizrachi, [name] ([name], [name] ([id number]) -------------------------------------------------------------------------------- HPI Details Patient Name: [name] Date of Service: 01/30/2016 3:30 PM Medical Record Number: [id number] Patient Account Number: [id number] Date of Birth/Sex: 01/26/1951 (65 y.o. Female) Treating RN: Montey Hora Primary Care Physician: LADA, [place] Other Clinician: Referring Physician: LADA, MELINDA Treating Physician/Extender: STONE III, HOYT Weeks in Treatment: 10 History of Present Illness Location: left and right lower extremity and dorsum of the right foot Quality: Patient reports  experiencing a sharp pain to affected area(s). Severity: Patient rates the pain to be a 8 out of 10 especially with palpation Duration: Patient has had the wound for > 6 months prior to seeking treatment at the wound center Timing: Pain in wound is Intermittent in severity but persistent Context: The wound appeared gradually over time Modifying Factors: Other treatment(s) tried include: symptomatic treatment as advised by her PCP Associated Signs and Symptoms: Patient reports having increase swelling iin her bilateral lower extremities HPI Description: 65 year old with a past medical history significant for MS, urinary incontinence, and obesity. She has been seen in the wound clinic before for lower extremity ulcerations treated with compression therapy. she is also known to have hypertension, peripheral vascular disease, COPD, obstructive sleep apnea, lumbar radiculopathy, kyphoscoliosis, urinary issues and tobacco abuse. Smokes a packet of cigarettes a day was recently seen at the [facility] for swelling of her legs and feet with a ulceration on the dorsum of the right foot which has been there for about 6 months. she was recently in the ER about  a month ago where she was seen for shortness of breath and swelling of the legs and a chest x-ray was within normal limits had an increase in her BNP and was given Lasix and put on doxycycline for a mild cellulitis and possible UTI. Wounds aresmaller today 11/13/15. Debrided and will continue Santyl. 12/21/2015 -- she was admitted to the hospital overnight on 12/18/2015 and diagnosed with urinary retention and cellulitis of the left lower leg. is past to take clindamycin and use Santyl for her wounds. 01/15/2016 -- come back to see [place] for almost a month and continues to be noncompliant with her dressings 01/30/16 patient presents today for a follow-up visit concerning her ongoing bilateral lower extremity wounds. We have not seen her for the past 2 weeks although we are supposed to be seen her for weekly visits. She currently has a Foley catheter. She tells me that the wounds are intensely painful especially with pressure and palpation at this point in time. Fortunately she is having no interval signs or symptoms of systemic infection but unfortunately the wounds have not improved dramatically since we last saw her. She does have home health coming to take care of her as well. She is currently not in any compression wraps. Electronic Signature(s) Signed: 02/01/2016 1:37:32 AM By: Worthy Keeler PA-C Entered By: Worthy Keeler on 01/30/2016 16:58:58 [name] ([id number]) -------------------------------------------------------------------------------- Physical Exam Details Patient Name: [name] Date of Service: 01/30/2016 3:30 PM Medical Record Number: [id number] Patient Account Number: [id number] Date of Birth/Sex: 01/29/1951 (65 y.o. Female) Treating RN: Montey Hora Primary Care Physician: LADA, [place] Other Clinician: Referring Physician: LADA, MELINDA Treating Physician/Extender: STONE III, HOYT Weeks in Treatment: 77 Constitutional Well-nourished and well-hydrated in no  acute distress. Respiratory normal breathing without difficulty. clear to auscultation bilaterally. Cardiovascular regular rate and rhythm with normal S1, S2. Psychiatric this patient is able to make decisions and demonstrates good insight into disease process. Alert and Oriented x 3. pleasant and cooperative. Electronic Signature(s) Signed: 02/01/2016 1:37:32 AM By: Worthy Keeler PA-C Entered By: Worthy Keeler on 01/30/2016 16:59:28 [name] ([id number]) -------------------------------------------------------------------------------- Physician Orders Details Patient Name: [name] Date of Service: 01/30/2016 3:30 PM Medical Record Number: [id number] Patient Account Number: [id number] Date of Birth/Sex: [birth year] (65 y.o. Female) Treating RN: Montey Hora Primary Care Physician: LADA, [place] Other Clinician: Referring Physician: LADA, MELINDA Treating Physician/Extender: STONE III, HOYT  Weeks in Treatment: 10 Verbal / Phone Orders: Yes Clinician: Montey Hora Read Back and Verified: Yes Diagnosis Coding Wound Cleansing Wound #2 Right,Dorsal Foot o May Shower, gently pat wound dry prior to applying new dressing. Wound #3 Right Lower Leg o May Shower, gently pat wound dry prior to applying new dressing. Wound #4 Left,Proximal,Lateral Lower Leg o May Shower, gently pat wound dry prior to applying new dressing. Wound #5 Left,Distal,Lateral Lower Leg o May Shower, gently pat wound dry prior to applying new dressing. Wound #6 Left,Anterior Lower Leg o May Shower, gently pat wound dry prior to applying new dressing. Anesthetic Wound #2 Right,Dorsal Foot o Topical Lidocaine 4% cream applied to wound bed prior to debridement - in clinic only Wound #3 Right Lower Leg o Topical Lidocaine 4% cream applied to wound bed prior to debridement - in clinic only Wound #4 Left,Proximal,Lateral Lower Leg o Topical Lidocaine 4% cream applied to wound bed prior  to debridement - in clinic only Wound #5 Left,Distal,Lateral Lower Leg o Topical Lidocaine 4% cream applied to wound bed prior to debridement - in clinic only Wound #6 Left,Anterior Lower Leg o Topical Lidocaine 4% cream applied to wound bed prior to debridement - in clinic only Skin Barriers/Peri-Wound Care Wound #2 Right,Dorsal Foot o Skin Prep Wound #3 Right Lower Leg Shoults, Roni H. ([id number]) o Skin Prep Wound #4 Left,Proximal,Lateral Lower Leg o Skin Prep Wound #5 Left,Distal,Lateral Lower Leg o Skin Prep Wound #6 Left,Anterior Lower Leg o Skin Prep Primary Wound Dressing Wound #2 Right,Dorsal Foot o Santyl Ointment o Medihoney gel - When you run out of santyl Wound #3 Right Lower Leg o Santyl Ointment o Medihoney gel - When you run out of santyl Wound #4 Left,Proximal,Lateral Lower Leg o Santyl Ointment o Medihoney gel - When you run out of santyl Wound #5 Left,Distal,Lateral Lower Leg o Santyl Ointment o Medihoney gel - When you run out of santyl Wound #6 Left,Anterior Lower Leg o Santyl Ointment o Medihoney gel - When you run out of santyl Secondary Dressing Wound #2 Right,Dorsal Foot o ABD pad o Dry Gauze Wound #3 Right Lower Leg o ABD pad o Dry Gauze Wound #4 Left,Proximal,Lateral Lower Leg o ABD pad o Dry Gauze Wound #5 Left,Distal,Lateral Lower Leg o ABD pad o Dry Gauze Clagg, Kallan H. ([id number]) Wound #6 Left,Anterior Lower Leg o ABD pad o Dry Gauze Dressing Change Frequency Wound #2 Right,Dorsal Foot o Three times weekly Wound #3 Right Lower Leg o Three times weekly Wound #4 Left,Proximal,Lateral Lower Leg o Three times weekly Wound #5 Left,Distal,Lateral Lower Leg o Three times weekly Wound #6 Left,Anterior Lower Leg o Three times weekly Follow-up Appointments Wound #2 Right,Dorsal Foot o Return Appointment in 1 week. Wound #3 Right Lower Leg o Return Appointment in 1  week. Wound #4 Left,Proximal,Lateral Lower Leg o Return Appointment in 1 week. Wound #5 Left,Distal,Lateral Lower Leg o Return Appointment in 1 week. Wound #6 Left,Anterior Lower Leg o Return Appointment in 1 week. Edema Control Wound #2 Right,Dorsal Foot o 2 Layer Lite Compression System - Bilateral o Patient to wear own compression stockings - HHRN please order compression hose for patient 20-75mmHg o Elevate legs to the level of the heart and pump ankles as often as possible Wound #3 Right Lower Leg o 2 Layer Lite Compression System - Bilateral o Patient to wear own compression stockings - HHRN please order compression hose for patient 20-60mmHg Hammad, Dakiya H. ([id number]) o Elevate legs to the level of the  heart and pump ankles as often as possible Wound #4 Left,Proximal,Lateral Lower Leg o 2 Layer Lite Compression System - Bilateral o Patient to wear own compression stockings - HHRN please order compression hose for patient 20-67mmHg o Elevate legs to the level of the heart and pump ankles as often as possible Wound #5 Left,Distal,Lateral Lower Leg o 2 Layer Lite Compression System - Bilateral o Patient to wear own compression stockings - HHRN please order compression hose for patient 20-68mmHg o Elevate legs to the level of the heart and pump ankles as often as possible Wound #6 Left,Anterior Lower Leg o 2 Layer Lite Compression System - Bilateral o Patient to wear own compression stockings - HHRN please order compression hose for patient 20-49mmHg o Elevate legs to the level of the heart and pump ankles as often as possible Additional Orders / Instructions Wound #2 Right,Dorsal Foot o Stop Smoking o Increase protein intake. Wound #3 Right Lower Leg o Stop Smoking o Increase protein intake. Wound #4 Left,Proximal,Lateral Lower Leg o Stop Smoking o Increase protein intake. Wound #5 Left,Distal,Lateral Lower Leg o Stop  Smoking o Increase protein intake. Wound #6 Left,Anterior Lower Leg o Stop Smoking o Increase protein intake. Home Health Wound #2 [place] Visits - [place] Nurse may visit PRN to address patientos wound care needs. o FACE TO FACE ENCOUNTER: MEDICARE and MEDICAID PATIENTS: I certify that this patient is under my care and that I had a face-to-face encounter that meets the physician face-to-face encounter requirements with this patient on this date. The encounter with the patient was in [place], TASSY JASON. ([id number]) whole or in part for the following MEDICAL CONDITION: (primary reason for [place]) MEDICAL NECESSITY: I certify, that based on my findings, NURSING services are a medically necessary home health service. HOME BOUND STATUS: I certify that my clinical findings support that this patient is homebound (i.e., Due to illness or injury, pt requires aid of supportive devices such as crutches, cane, wheelchairs, walkers, the use of special transportation or the assistance of another person to leave their place of residence. There is a normal inability to leave the home and doing so requires considerable and taxing effort. Other absences are for medical reasons / religious services and are infrequent or of short duration when for other reasons). o If current dressing causes regression in wound condition, may D/C ordered dressing product/s and apply Normal Saline Moist Dressing daily until next [place] / Other MD appointment. [place] of regression in wound condition at [phone]. o Please direct any NON-WOUND related issues/requests for orders to patient's Primary Care Physician Wound #3 Right Lower Leg o [place] Visits - [place] Nurse may visit PRN to address patientos wound care needs. o FACE TO FACE ENCOUNTER: MEDICARE and MEDICAID PATIENTS: I  certify that this patient is under my care and that I had a face-to-face encounter that meets the physician face-to-face encounter requirements with this patient on this date. The encounter with the patient was in whole or in part for the following MEDICAL CONDITION: (primary reason for [place]) MEDICAL NECESSITY: I certify, that based on my findings, NURSING services are a medically necessary home health service. HOME BOUND STATUS: I certify that my clinical findings support that this patient is homebound (i.e., Due to illness or injury, pt requires aid of supportive devices such as crutches, cane, wheelchairs, walkers, the use of special transportation or the assistance  of another person to leave their place of residence. There is a normal inability to leave the home and doing so requires considerable and taxing effort. Other absences are for medical reasons / religious services and are infrequent or of short duration when for other reasons). o If current dressing causes regression in wound condition, may D/C ordered dressing product/s and apply Normal Saline Moist Dressing daily until next [place] / Other MD appointment. [place] of regression in wound condition at [phone]. o Please direct any NON-WOUND related issues/requests for orders to patient's Primary Care Physician Wound #4 Left,Proximal,Lateral Lower Leg o [place] Visits - [place] Nurse may visit PRN to address patientos wound care needs. o FACE TO FACE ENCOUNTER: MEDICARE and MEDICAID PATIENTS: I certify that this patient is under my care and that I had a face-to-face encounter that meets the physician face-to-face encounter requirements with this patient on this date. The encounter with the patient was in whole or in part for the following MEDICAL CONDITION: (primary reason for [place]) MEDICAL NECESSITY: I certify, that based on my  findings, NURSING services are a medically necessary home health service. HOME BOUND STATUS: I certify that my clinical findings support that this patient is homebound (i.e., Due to illness or injury, pt requires aid of supportive devices such as crutches, cane, wheelchairs, walkers, the use of special transportation or the assistance of another person to leave their place of residence. There is a normal inability to leave the home and doing so requires considerable and taxing effort. Other DOROTHYE, CHESEBRO ([id number]) absences are for medical reasons / religious services and are infrequent or of short duration when for other reasons). o If current dressing causes regression in wound condition, may D/C ordered dressing product/s and apply Normal Saline Moist Dressing daily until next [place] / Other MD appointment. [place] of regression in wound condition at [phone]. o Please direct any NON-WOUND related issues/requests for orders to patient's Primary Care Physician Wound #5 Left,Distal,Lateral Lower Leg o [place] Visits - [place] Nurse may visit PRN to address patientos wound care needs. o FACE TO FACE ENCOUNTER: MEDICARE and MEDICAID PATIENTS: I certify that this patient is under my care and that I had a face-to-face encounter that meets the physician face-to-face encounter requirements with this patient on this date. The encounter with the patient was in whole or in part for the following MEDICAL CONDITION: (primary reason for [place]) MEDICAL NECESSITY: I certify, that based on my findings, NURSING services are a medically necessary home health service. HOME BOUND STATUS: I certify that my clinical findings support that this patient is homebound (i.e., Due to illness or injury, pt requires aid of supportive devices such as crutches, cane, wheelchairs, walkers, the use of special transportation or the  assistance of another person to leave their place of residence. There is a normal inability to leave the home and doing so requires considerable and taxing effort. Other absences are for medical reasons / religious services and are infrequent or of short duration when for other reasons). o If current dressing causes regression in wound condition, may D/C ordered dressing product/s and apply Normal Saline Moist Dressing daily until next [place] / Other MD appointment. [place] of regression in wound condition at [phone]. o Please direct any NON-WOUND related issues/requests for orders to patient's Primary Care Physician Wound #6 Left,Anterior Lower Leg   o [place] Visits - [place] Nurse may visit PRN to address patientos wound care needs. o FACE TO FACE ENCOUNTER: MEDICARE and MEDICAID PATIENTS: I certify that this patient is under my care and that I had a face-to-face encounter that meets the physician face-to-face encounter requirements with this patient on this date. The encounter with the patient was in whole or in part for the following MEDICAL CONDITION: (primary reason for [place]) MEDICAL NECESSITY: I certify, that based on my findings, NURSING services are a medically necessary home health service. HOME BOUND STATUS: I certify that my clinical findings support that this patient is homebound (i.e., Due to illness or injury, pt requires aid of supportive devices such as crutches, cane, wheelchairs, walkers, the use of special transportation or the assistance of another person to leave their place of residence. There is a normal inability to leave the home and doing so requires considerable and taxing effort. Other absences are for medical reasons / religious services and are infrequent or of short duration when for other reasons). o If current dressing causes regression in wound condition, may D/C ordered  dressing product/s and apply Normal Saline Moist Dressing daily until next [place] / Other MD appointment. [employer] of regression in wound condition at [phone]. o Please direct any NON-WOUND related issues/requests for orders to patient's Primary Care Physician AKIMA, LUTZKE ([id number]) Medications-please add to medication list. Wound #2 Right,Dorsal Foot o Santyl Enzymatic Ointment Wound #3 Right Lower Leg o Santyl Enzymatic Ointment Wound #4 Left,Proximal,Lateral Lower Leg o Santyl Enzymatic Ointment Wound #5 Left,Distal,Lateral Lower Leg o Santyl Enzymatic Ointment Wound #6 Left,Anterior Lower Leg o Santyl Enzymatic Ointment Electronic Signature(s) Signed: 01/30/2016 5:47:08 PM By: Montey Hora Signed: 02/01/2016 1:37:32 AM By: Worthy Keeler PA-C Entered By: Montey Hora on 01/30/2016 16:39:18 [name] ([id number]) -------------------------------------------------------------------------------- Problem List Details Patient Name: [name] Date of Service: 01/30/2016 3:30 PM Medical Record Number: [id number] Patient Account Number: [id number] Date of Birth/Sex: 05-Jan-1951 (65 y.o. 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 11/20/2015 Yes exposed L97.222 Non-pressure chronic ulcer of left calf with fat layer 11/20/2015 Yes exposed F17.218 Nicotine dependence, cigarettes, with other nicotine- 11/20/2015 Yes induced  disorders Inactive Problems Resolved Problems Electronic Signature(s) Signed: 02/01/2016 1:37:32 AM By: Worthy Keeler PA-C Entered By: Worthy Keeler on 01/30/2016 16:53:50 [name] ([id number]) -------------------------------------------------------------------------------- Progress Note Details Patient Name: [name] Date of Service: 01/30/2016 3:30 PM Medical Record Number: [id number] Patient Account Number: [id number] Date of Birth/Sex: [birth year] (65 y.o. Female) Treating RN: Montey Hora Primary Care Physician: LADA, [place] Other Clinician: Referring Physician: LADA, MELINDA Treating Physician/Extender: STONE III, HOYT Weeks in Treatment: 10 Subjective Chief Complaint Information obtained from Patient Patient seen for complaints of Non-Healing Wound to the left and right lower extremity and the right dorsum of the foot History of Present Illness (HPI) The following HPI elements were documented for the patient's wound: Location: left and right lower extremity and dorsum of the right foot Quality: Patient reports experiencing a sharp pain to affected area(s).  Severity: Patient rates the pain to be a 8 out of 10 especially with palpation Duration: Patient has had the wound for > 6 months prior to seeking treatment at the wound center Timing: Pain in wound is Intermittent in severity but persistent Context: The wound appeared gradually over time Modifying Factors: Other treatment(s) tried include: symptomatic treatment as advised by her PCP Associated Signs and Symptoms: Patient reports having increase swelling iin her bilateral lower extremities 65 year old with a past medical history significant for MS, urinary incontinence, and obesity. She has been seen in the wound clinic before for lower extremity ulcerations treated with compression therapy. she is also known to have hypertension, peripheral vascular disease, COPD, obstructive sleep apnea,  lumbar radiculopathy, kyphoscoliosis, urinary issues and tobacco abuse. Smokes a packet of cigarettes a day was recently seen at the [facility] for swelling of her legs and feet with a ulceration on the dorsum of the right foot which has been there for about 6 months. she was recently in the ER about a month ago where she was seen for shortness of breath and swelling of the legs and a chest x-ray was within normal limits had an increase in her BNP and was given Lasix and put on doxycycline for a mild cellulitis and possible UTI. Wounds aresmaller today 11/13/15. Debrided and will continue Santyl. 12/21/2015 -- she was admitted to the hospital overnight on 12/18/2015 and diagnosed with urinary retention and cellulitis of the left lower leg. is past to take clindamycin and use Santyl for her wounds. 01/15/2016 -- come back to see [place] for almost a month and continues to be noncompliant with her dressings 01/30/16 patient presents today for a follow-up visit concerning her ongoing bilateral lower extremity wounds. We have not seen her for the past 2 weeks although we are supposed to be seen her for weekly visits. She currently has a Foley catheter. She tells me that the wounds are intensely painful especially with pressure and palpation at this point in time. Fortunately she is having no interval signs or symptoms of systemic infection but unfortunately the wounds have not improved dramatically since we last saw her. She does have home health coming to take care of her as well. She is currently not in any compression wraps. SHAUNIKA, RICHBERG ([id number]) Objective Constitutional Well-nourished and well-hydrated in no acute distress. Vitals Time Taken: 3:49 PM, Height: 63 in, Weight: 270 lbs, BMI: 47.8, Temperature: 98.4 F, Pulse: 90 bpm, Respiratory Rate: 20 breaths/min, Blood Pressure: 132/67 mmHg. Respiratory normal breathing without difficulty. clear to auscultation  bilaterally. Cardiovascular regular rate and rhythm with normal S1, S2. Psychiatric this patient is able to make decisions and demonstrates good insight into disease process. Alert and Oriented x 3. pleasant and cooperative. Integumentary (Hair, Skin) Wound #2 status is Open. Original cause of wound was Trauma. The wound is located on the Right,Dorsal Foot. The wound measures 0.9cm length x 1cm width x 0.1cm depth; 0.707cm^2 area and 0.071cm^3 volume. The wound is limited to skin breakdown. There is a large amount of serous drainage noted. The wound margin is flat and intact. There is no granulation within the wound bed. There is a large (67-100%) amount of necrotic tissue within the wound bed including Adherent Slough. The periwound skin appearance exhibited: Localized Edema, Ecchymosis. The periwound skin appearance did not exhibit: Callus, Crepitus, Excoriation, Fluctuance, Friable, Induration, Rash, Scarring, Dry/Scaly, Maceration, Moist, Atrophie Blanche, Cyanosis, Hemosiderin Staining, Mottled, Pallor, Rubor, Erythema. Wound #3 status is Open. Original cause  of wound was Trauma. The wound is located on the Right Lower Leg. The wound measures 2cm length x 1.8cm width x 0.1cm depth; 2.827cm^2 area and 0.283cm^3 volume. The wound is limited to skin breakdown. There is a medium amount of serous drainage noted. The wound margin is flat and intact. There is no granulation within the wound bed. There is a large (67-100%) amount of necrotic tissue within the wound bed including Adherent Slough. The periwound skin appearance did not exhibit: Callus, Crepitus, Excoriation, Fluctuance, Friable, Induration, Localized Edema, Rash, Scarring, Dry/Scaly, Maceration, Moist, Atrophie Blanche, Cyanosis, Ecchymosis, Hemosiderin Staining, Mottled, Pallor, Rubor, Erythema. Wound #4 status is Open. Original cause of wound was Trauma. The wound is located on the Left,Proximal,Lateral Lower Leg. The wound  measures 7cm length x 7cm width x 0.1cm depth; 38.485cm^2 area and 3.848cm^3 volume. The wound is limited to skin breakdown. There is no tunneling or undermining noted. There is a medium amount of serous drainage noted. The wound margin is flat and intact. There is Loney, Shalece H. ([id number]) medium (34-66%) pale granulation within the wound bed. There is a medium (34-66%) amount of necrotic tissue within the wound bed including Adherent Slough. The periwound skin appearance exhibited: Excoriation, Moist, Erythema. The periwound skin appearance did not exhibit: Callus, Crepitus, Fluctuance, Friable, Induration, Localized Edema, Rash, Scarring, Dry/Scaly, Maceration, Atrophie Blanche, Cyanosis, Ecchymosis, Hemosiderin Staining, Mottled, Pallor, Rubor. The surrounding wound skin color is noted with erythema. Wound #5 status is Open. Original cause of wound was Gradually Appeared. The wound is located on the Left,Distal,Lateral Lower Leg. The wound measures 2cm length x 2.5cm width x 0.2cm depth; 3.927cm^2 area and 0.785cm^3 volume. The wound is limited to skin breakdown. There is a medium amount of serous drainage noted. The wound margin is flat and intact. There is medium (34-66%) pink, pale granulation within the wound bed. There is a medium (34-66%) amount of necrotic tissue within the wound bed including Adherent Slough. The periwound skin appearance exhibited: Moist. The periwound skin appearance did not exhibit: Callus, Crepitus, Excoriation, Fluctuance, Friable, Induration, Localized Edema, Rash, Scarring, Dry/Scaly, Maceration, Atrophie Blanche, Cyanosis, Ecchymosis, Hemosiderin Staining, Mottled, Pallor, Rubor, Erythema. Wound #6 status is Open. Original cause of wound was Gradually Appeared. The wound is located on the Left,Anterior Lower Leg. The wound measures 8cm length x 4cm width x 0.1cm depth; 25.133cm^2 area and 2.513cm^3 volume. The wound is limited to skin breakdown. There is a  large amount of serosanguineous drainage noted. The wound margin is flat and intact. There is no granulation within the wound bed. There is a large (67-100%) amount of necrotic tissue within the wound bed including Adherent Slough. The periwound skin appearance did not exhibit: Callus, Crepitus, Excoriation, Fluctuance, Friable, Induration, Localized Edema, Rash, Scarring, Dry/Scaly, Maceration, Moist, Atrophie Blanche, Cyanosis, Ecchymosis, Hemosiderin Staining, Mottled, Pallor, Rubor, Erythema. 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 There was a Skin/Subcutaneous Tissue Debridement [id number] debridement with total area of 3.6 sq cm performed by STONE III, HOYT. with the following instrument(s): Curette to remove Viable and Non-Viable tissue/material including Fibrin/Slough and Subcutaneous after achieving pain control using Lidocaine 4% Topical Solution. A time out was conducted at 16:35, prior to the start of the procedure. A Minimum amount of bleeding was controlled with Pressure. The  procedure was tolerated well with a pain level of 0 throughout and a pain level of 0 following the procedure. Post Debridement Measurements: 2cm length x 1.8cm width x 0.2cm depth; 0.565cm^3 volume. Character of Wound/Ulcer Post Debridement is improved. Severity of Tissue Post Debridement is: Fat layer exposed. Post procedure Diagnosis Wound #3: Same as Pre-Procedure Wound #4 Wound #4 is a Venous Leg Ulcer located on the Left,Proximal,Lateral Lower Leg . There was a Skin/Subcutaneous Tissue Debridement [id number]) debridement with total area of 25 sq cm performed by STONE III, HOYT. with the following instrument(s): Curette to remove Viable and Non-Viable tissue/material including Fibrin/Slough and Subcutaneous after achieving pain control using Lidocaine 4% Topical Solution. A time out was conducted at 16:31, prior to the start of the procedure. A Minimum amount of bleeding was controlled with Pressure. The procedure was tolerated well with a pain level of 0 throughout and a pain level of 0 following the procedure. Post Debridement Measurements: 7cm length x 7cm width x 0.3cm depth; 11.545cm^3 volume. Character of Wound/Ulcer Post Debridement is improved. Severity of Tissue Post Debridement is: Fat layer exposed. Post procedure Diagnosis Wound #4: Same as Pre-Procedure Plan Wound Cleansing: Wound #2 Right,Dorsal Foot: May Shower, gently pat wound dry prior to applying new dressing. Wound #3 Right Lower Leg: May Shower, gently pat wound dry prior to applying new dressing. Wound #4 Left,Proximal,Lateral Lower Leg: May Shower, gently pat wound dry prior to applying new dressing. Wound #5 Left,Distal,Lateral Lower Leg: JAVONA, BENSTON ([id number]) May Shower, gently pat wound dry prior to applying new dressing. Wound #6 Left,Anterior Lower Leg: May Shower, gently pat wound dry prior to applying new dressing. Anesthetic: Wound #2 Right,Dorsal Foot: Topical Lidocaine 4% cream applied to  wound bed prior to debridement - in clinic only Wound #3 Right Lower Leg: Topical Lidocaine 4% cream applied to wound bed prior to debridement - in clinic only Wound #4 Left,Proximal,Lateral Lower Leg: Topical Lidocaine 4% cream applied to wound bed prior to debridement - in clinic only Wound #5 Left,Distal,Lateral Lower Leg: Topical Lidocaine 4% cream applied to wound bed prior to debridement - in clinic only Wound #6 Left,Anterior Lower Leg: Topical Lidocaine 4% cream applied to wound bed prior to debridement - in clinic only Skin Barriers/Peri-Wound Care: Wound #2 Right,Dorsal Foot: Skin Prep Wound #3 Right Lower Leg: Skin Prep Wound #4 Left,Proximal,Lateral Lower Leg: Skin Prep Wound #5 Left,Distal,Lateral Lower Leg: Skin Prep Wound #6 Left,Anterior Lower Leg: Skin Prep Primary Wound Dressing: Wound #2 Right,Dorsal Foot: Santyl Ointment Medihoney gel - When you run out of santyl Wound #3 Right Lower Leg: Santyl Ointment Medihoney gel - When you run out of santyl Wound #4 Left,Proximal,Lateral Lower Leg: Santyl Ointment Medihoney gel - When you run out of santyl Wound #5 Left,Distal,Lateral Lower Leg: Santyl Ointment Medihoney gel - When you run out of santyl Wound #6 Left,Anterior Lower Leg: Santyl Ointment Medihoney gel - When you run out of santyl Secondary  Dressing: Wound #2 Right,Dorsal Foot: ABD pad Dry Gauze Wound #3 Right Lower Leg: ABD pad Dry Gauze Wound #4 Left,Proximal,Lateral Lower Leg: ABD pad Dry Gauze Ledet, Anevay H. ([id number]) Wound #5 Left,Distal,Lateral Lower Leg: ABD pad Dry Gauze Wound #6 Left,Anterior Lower Leg: ABD pad Dry Gauze Dressing Change Frequency: Wound #2 Right,Dorsal Foot: Three times weekly Wound #3 Right Lower Leg: Three times weekly Wound #4 Left,Proximal,Lateral Lower Leg: Three times weekly Wound #5 Left,Distal,Lateral Lower Leg: Three times weekly Wound #6 Left,Anterior Lower Leg: Three times weekly Follow-up  Appointments: Wound #2 Right,Dorsal Foot: Return Appointment in 1 week. Wound #3 Right Lower Leg: Return Appointment in 1 week. Wound #4 Left,Proximal,Lateral Lower Leg: Return Appointment in 1 week. Wound #5 Left,Distal,Lateral Lower Leg: Return Appointment in 1 week. Wound #6 Left,Anterior Lower Leg: Return Appointment in 1 week. Edema Control: Wound #2 Right,Dorsal Foot: 2 Layer Lite Compression System - Bilateral Patient to wear own compression stockings - HHRN please order compression hose for patient 20- 64mmHg Elevate legs to the level of the heart and pump ankles as often as possible Wound #3 Right Lower Leg: 2 Layer Lite Compression System - Bilateral Patient to wear own compression stockings - HHRN please order compression hose for patient 20- 19mmHg Elevate legs to the level of the heart and pump ankles as often as possible Wound #4 Left,Proximal,Lateral Lower Leg: 2 Layer Lite Compression System - Bilateral Patient to wear own compression stockings - HHRN please order compression hose for patient 20- 67mmHg Elevate legs to the level of the heart and pump ankles as often as possible Wound #5 Left,Distal,Lateral Lower Leg: 2 Layer Lite Compression System - Bilateral Patient to wear own compression stockings - HHRN please order compression hose for patient 20- 79mmHg Elevate legs to the level of the heart and pump ankles as often as possible Wound #6 Left,Anterior Lower Leg: 2 Layer Lite Compression System - Bilateral Nordlund, Kambryn H. ([id number]) Patient to wear own compression stockings - HHRN please order compression hose for patient 20- 65mmHg Elevate legs to the level of the heart and pump ankles as often as possible Additional Orders / Instructions: Wound #2 Right,Dorsal Foot: Stop Smoking Increase protein intake. Wound #3 Right Lower Leg: Stop Smoking Increase protein intake. Wound #4 Left,Proximal,Lateral Lower Leg: Stop Smoking Increase protein  intake. Wound #5 Left,Distal,Lateral Lower Leg: Stop Smoking Increase protein intake. Wound #6 Left,Anterior Lower Leg: Stop Smoking Increase protein intake. Home Health: Wound #2 Right,Dorsal Foot: [place] Visits - [place] Nurse may visit PRN to address patient s wound care needs. FACE TO FACE ENCOUNTER: MEDICARE and MEDICAID PATIENTS: I certify that this patient is under my care and that I had a face-to-face encounter that meets the physician face-to-face encounter requirements with this patient on this date. The encounter with the patient was in whole or in part for the following MEDICAL CONDITION: (primary reason for [place]) MEDICAL NECESSITY: I certify, that based on my findings, NURSING services are a medically necessary home health service. HOME BOUND STATUS: I certify that my clinical findings support that this patient is homebound (i.e., Due to illness or injury, pt requires aid of supportive devices such as crutches, cane, wheelchairs, walkers, the use of special transportation or the assistance of another person to leave their place of residence. There is a normal inability to leave the home and doing so requires considerable and taxing effort. Other absences are for medical reasons / religious services and are infrequent or of short  duration when for other reasons). If current dressing causes regression in wound condition, may D/C ordered dressing product/s and apply Normal Saline Moist Dressing daily until next [place] / Other MD appointment. [place] of regression in wound condition at [phone]. Please direct any NON-WOUND related issues/requests for orders to patient's Primary Care Physician Wound #3 Right Lower Leg: [place] Nurse may visit PRN to address patient s wound care needs. FACE TO FACE ENCOUNTER: MEDICARE and MEDICAID PATIENTS: I certify that this  patient is under my care and that I had a face-to-face encounter that meets the physician face-to-face encounter requirements with this patient on this date. The encounter with the patient was in whole or in part for the following MEDICAL CONDITION: (primary reason for [place]) MEDICAL NECESSITY: I certify, that based on my findings, NURSING services are a medically necessary home health service. HOME BOUND STATUS: I certify that my clinical findings support that this patient is homebound (i.e., Due to illness or injury, pt requires aid of supportive devices such as crutches, cane, wheelchairs, walkers, the use of special transportation or the assistance of another person to leave their place of residence. There is a normal inability to leave the home and doing so requires considerable and taxing effort. Other absences are for medical reasons / religious services and are infrequent or of short duration when for other reasons). If current dressing causes regression in wound condition, may D/C ordered dressing product/s and apply Nored, Phylliss H. ([id number]) Normal Saline Moist Dressing daily until next [place] / Other MD appointment. [place] of regression in wound condition at [phone]. Please direct any NON-WOUND related issues/requests for orders to patient's Primary Care Physician Wound #4 Left,Proximal,Lateral Lower Leg: [place] Nurse may visit PRN to address patient s wound care needs. FACE TO FACE ENCOUNTER: MEDICARE and MEDICAID PATIENTS: I certify that this patient is under my care and that I had a face-to-face encounter that meets the physician face-to-face encounter requirements with this patient on this date. The encounter with the patient was in whole or in part for the following MEDICAL CONDITION: (primary reason for [place]) MEDICAL NECESSITY: I certify, that based on my findings, NURSING  services are a medically necessary home health service. HOME BOUND STATUS: I certify that my clinical findings support that this patient is homebound (i.e., Due to illness or injury, pt requires aid of supportive devices such as crutches, cane, wheelchairs, walkers, the use of special transportation or the assistance of another person to leave their place of residence. There is a normal inability to leave the home and doing so requires considerable and taxing effort. Other absences are for medical reasons / religious services and are infrequent or of short duration when for other reasons). If current dressing causes regression in wound condition, may D/C ordered dressing product/s and apply Normal Saline Moist Dressing daily until next [place] / Other MD appointment. [place] of regression in wound condition at [phone]. Please direct any NON-WOUND related issues/requests for orders to patient's Primary Care Physician Wound #5 Left,Distal,Lateral Lower Leg: [place] Nurse may visit PRN to address patient s wound care needs. FACE TO FACE ENCOUNTER: MEDICARE and MEDICAID PATIENTS: I certify that this patient is under my care and that I had a face-to-face encounter that meets the physician face-to-face encounter requirements with  this patient on this date. The encounter with the patient was in whole or in part for the following MEDICAL CONDITION: (primary reason for [place]) MEDICAL NECESSITY: I certify, that based on my findings, NURSING services are a medically necessary home health service. HOME BOUND STATUS: I certify that my clinical findings support that this patient is homebound (i.e., Due to illness or injury, pt requires aid of supportive devices such as crutches, cane, wheelchairs, walkers, the use of special transportation or the assistance of another person to leave their place of residence. There is  a normal inability to leave the home and doing so requires considerable and taxing effort. Other absences are for medical reasons / religious services and are infrequent or of short duration when for other reasons). If current dressing causes regression in wound condition, may D/C ordered dressing product/s and apply Normal Saline Moist Dressing daily until next [place] / Other MD appointment. [place] of regression in wound condition at [phone]. Please direct any NON-WOUND related issues/requests for orders to patient's Primary Care Physician Wound #6 Left,Anterior Lower Leg: [place] Nurse may visit PRN to address patient s wound care needs. FACE TO FACE ENCOUNTER: MEDICARE and MEDICAID PATIENTS: I certify that this patient is under my care and that I had a face-to-face encounter that meets the physician face-to-face encounter requirements with this patient on this date. The encounter with the patient was in whole or in part for the following MEDICAL CONDITION: (primary reason for [place]) MEDICAL NECESSITY: I certify, that based on my findings, NURSING services are a medically necessary home health service. HOME BOUND STATUS: I certify that my clinical findings support that this patient is homebound (i.e., Due to illness or injury, pt requires aid of supportive devices such as crutches, cane, wheelchairs, walkers, the use of special transportation or the assistance of another person to leave their place of residence. There is a normal inability to leave the home and doing so requires considerable and taxing effort. Other absences are for medical reasons / religious services and are infrequent or of short duration when for other reasons). If current dressing causes regression in wound condition, may D/C ordered dressing product/s and apply Goeller, Shemica H. ([id number]) Normal Saline Moist Dressing daily until  next [place] / Other MD appointment. [place] of regression in wound condition at [phone]. Please direct any NON-WOUND related issues/requests for orders to patient's Primary Care Physician Medications-please add to medication list.: Wound #2 Right,Dorsal Foot: Santyl Enzymatic Ointment Wound #3 Right Lower Leg: Santyl Enzymatic Ointment Wound #4 Left,Proximal,Lateral Lower Leg: Santyl Enzymatic Ointment Wound #5 Left,Distal,Lateral Lower Leg: Santyl Enzymatic Ointment Wound #6 Left,Anterior Lower Leg: Santyl Enzymatic Ointment Follow-Up Appointments: A follow-up appointment should be scheduled. A Patient Clinical Summary of Care was provided to BW Currently patient continues to have a significant amount of swelling. She also did have some slough present on the wounds of her left lateral ankle as well as right lateral ankle. Using a curette I was able to sharply debride this area to clean it up to a degree. However there was still not a great granulation bed at the base. Abdomen recommend that we continue with the Santyl at this point in time. I'm also going to recommend a 2 layer compression wrap to the bilateral lower extremities at this point in time. Hopefully this will help not only heal these wounds but if we can get  her swelling under control and then continue the compression following prevent at least as many of these wounds from showing up in the future. She is in agreement with this plan. She really just wants these to get better. We will see her back for a follow-up visit in one week I did stress that it is important for her to follow-up weekly in order for [place] to judge how things are proceeding and ensure that everything is proceeding according to plan. She understands. the best news she tells me is that she has actually ceased smoking at this point in time which I think can be a definite benefit for her long-term. Electronic  Signature(s) Signed: 02/21/2016 9:13:24 PM By: Worthy Keeler PA-C Previous Signature: 02/01/2016 1:37:32 AM Version By: Worthy Keeler PA-C Entered By: Worthy Keeler on 02/21/2016 21:13:24 [name] ([id number]) -------------------------------------------------------------------------------- SuperBill Details Patient Name: [name] Date of Service: 01/30/2016 Medical Record Number: [id number] Patient Account Number: [id number] Date of Birth/Sex: 1950-10-13 (65 y.o. 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 F17.218 Nicotine dependence, cigarettes, with other nicotine-induced disorders Facility Procedures CPT4 Code Description: [id number] - DEB SUBQ TISSUE 20 SQ CM/< ICD-10 Description Diagnosis L97.512 Non-pressure chronic ulcer of other part of right fo L97.222 Non-pressure chronic ulcer of left calf with fat lay L97.212 Non-pressure chronic ulcer  of right calf with fat la Modifier: ot with fat la er exposed yer exposed Quantity: 1 yer exposed CPT4 Code Description: [id number] - DEB SUBQ TISS EA ADDL 20CM ICD-10 Description Diagnosis L97.512 Non-pressure chronic ulcer of other part of right fo L97.212 Non-pressure chronic ulcer of right calf with fat la L97.222 Non-pressure chronic ulcer  of left calf with fat lay Modifier: ot with fat la yer exposed er exposed Quantity: 1 yer exposed Physician Procedures CPT4 Code Description: [id number] - WC PHYS SUBQ TISS 20  SQ CM ICD-10 Description Diagnosis L97.512 Non-pressure chronic ulcer of other part of right foo L97.222 Non-pressure chronic ulcer of left calf with fat laye L97.212 Non-pressure chronic ulcer  of right calf with fat lay Guimond, Aleicia H. ([id number]) Modifier: t with fat lay r exposed er exposed Quantity: 1 er exposed Electronic Signature(s) Signed: 02/01/2016 1:37:32 AM By: Worthy Keeler PA-C Entered By: Worthy Keeler on 01/30/2016 17:03:55

## 2016-02-01 NOTE — Patient Instructions (Signed)
I am so proud of you for quitting smoking Don't let anyone smoke around you

## 2016-02-01 NOTE — Progress Notes (Signed)
BP 124/68   Pulse 75   Temp 99.2 F (37.3 C) (Oral)   Resp 16   Wt 268 lb (121.6 kg)   SpO2 97%   BMI 47.47 kg/m    Subjective:    Patient ID: [name], female    DOB: 1951/01/22, 65 y.o.   MRN: [id number]  HPI: [name] is a 65 y.o. female  Chief Complaint  Patient presents with  . Follow-up   Her legs hurt; she is seeing the wound center for her ulcers; she goes over there once a week; previous call about cellulitis, and she has seen them since then; they did not start her on antibiotics and told her to see her primary doctor about this; she saw PA this last time a few days ago; she is not having any fevers; drainage from these sites; on the left leg there is a place all around the front of the left leg; hole on the lateral right side  She quit smoking about a week ago; praise immediately given  She was on triamterene/hctz and that was stopped and was started on plain hctz; health nurse is checking BP at home and it's "always good"; she is not trying to stay away salt; does add some salt sometimes  Breathing is "doing okay, I guess"  Relevant past medical, surgical, family and social history reviewed Past Medical History:  Diagnosis Date  . Anxiety   . COPD (chronic obstructive pulmonary disease) ([place])   . Depression   . Hypertension   . Kyphoscoliosis and scoliosis 11/26/2011  . Morbid obesity ([place]) 01/05/2016  . Multiple sclerosis ([place])   . Neuromuscular disorder ([place])   . Peripheral vascular disease of lower extremity with ulceration ([place]) 11/08/2015  . Skin ulcer ([place]) 11/08/2015   Past Surgical History:  Procedure Laterality Date  . BACK SURGERY N/A 2002  . TONSILLECTOMY AND ADENOIDECTOMY    . TUBAL LIGATION     Family History  Problem Relation Age of Onset  . COPD Mother   . Diabetes Mother   . Alcohol abuse Father   . Kidney disease Father    Social History  Substance Use Topics  . Smoking status: Former Smoker    Types: Cigarettes  .  Smokeless tobacco: Never Used  . Alcohol use No   Interim medical history since last visit reviewed. Allergies and medications reviewed  Review of Systems Per HPI unless specifically indicated above     Objective:    BP 124/68   Pulse 75   Temp 99.2 F (37.3 C) (Oral)   Resp 16   Wt 268 lb (121.6 kg)   SpO2 97%   BMI 47.47 kg/m   Wt Readings from Last 3 Encounters:  02/01/16 268 lb (121.6 kg)  01/05/16 275 lb 8 oz (125 kg)  12/22/15 275 lb (124.7 kg)    Physical Exam  Constitutional: She appears well-developed and well-nourished. No distress.  Weight down 7.5 pounds over last month  Eyes: EOM are normal. No scleral icterus.  Neck: No thyromegaly present.  Cardiovascular: Normal rate and regular rhythm.   Pulmonary/Chest: Effort normal and breath sounds normal.  Abdominal: She exhibits no distension.  Musculoskeletal:  Both legs wrapped in oona boot-type material  Skin: No pallor.  Psychiatric: She has a normal mood and affect. Her behavior is normal. Judgment and thought content normal.   Results for orders placed or performed during the hospital encounter of 12/18/15  Urinalysis complete, with microscopic- may I&O  cath if menses  Result Value Ref Range   Color, Urine YELLOW (A) YELLOW   APPearance CLEAR (A) CLEAR   Glucose, UA NEGATIVE NEGATIVE mg/dL   Bilirubin Urine NEGATIVE NEGATIVE   Ketones, ur NEGATIVE NEGATIVE mg/dL   Specific Gravity, Urine 1.009 1.005 - 1.030   Hgb urine dipstick NEGATIVE NEGATIVE   pH 7.0 5.0 - 8.0   Protein, ur NEGATIVE NEGATIVE mg/dL   Nitrite NEGATIVE NEGATIVE   Leukocytes, UA NEGATIVE NEGATIVE   RBC / HPF NONE SEEN 0 - 5 RBC/hpf   WBC, UA 0-5 0 - 5 WBC/hpf   Bacteria, UA NONE SEEN NONE SEEN   Squamous Epithelial / LPF 0-5 (A) NONE SEEN  Basic metabolic panel  Result Value Ref Range   Sodium 138 135 - 145 mmol/L   Potassium 3.7 3.5 - 5.1 mmol/L   Chloride 104 101 - 111 mmol/L   CO2 24 22 - 32 mmol/L   Glucose, Bld 96 65 -  99 mg/dL   BUN 20 6 - 20 mg/dL   Creatinine, Ser 1.40 (H) 0.44 - 1.00 mg/dL   Calcium 9.6 8.9 - 10.3 mg/dL   GFR calc non Af Amer 39 (L) >60 mL/min   GFR calc Af Amer 45 (L) >60 mL/min   Anion gap 10 5 - 15  CBC  Result Value Ref Range   WBC 7.3 3.6 - 11.0 K/uL   RBC 4.38 3.80 - 5.20 MIL/uL   Hemoglobin 12.7 12.0 - 16.0 g/dL   HCT 37.4 35.0 - 47.0 %   MCV 85.4 80.0 - 100.0 fL   MCH 29.1 26.0 - 34.0 pg   MCHC 34.1 32.0 - 36.0 g/dL   RDW 14.6 (H) 11.5 - 14.5 %   Platelets 263 150 - 440 K/uL      Assessment & Plan:   Problem List Items Addressed This Visit      Cardiovascular and Mediastinum   Hypertension goal BP (blood pressure) < 140/90    Well-controlled        Respiratory   COPD with bronchial hyperresponsiveness (HCC)    So proud of patient for giving up her cigarettes; urged no second hand smoke exposure      Relevant Medications   SPIRIVA HANDIHALER 18 MCG inhalation capsule     Musculoskeletal and Integument   Skin ulcer ([place])    Talked with staff at wound care center; they will fax over the note; they said that if they thought an antibiotic was indicated, they could take care of that        Other   Morbid obesity ([place])    Praised patient for weight loss, keep trying       Other Visit Diagnoses   None.      Follow up plan: No Follow-up on file.  An after-visit summary was printed and given to the patient at [facility].  Please see the patient instructions which may contain other information and recommendations beyond what is mentioned above in the assessment and plan.  Meds ordered this encounter  Medications  . VESICARE 10 MG tablet    Sig: TK 1 T PO QD    Refill:  11  . SPIRIVA HANDIHALER 18 MCG inhalation capsule    Sig: INL THE CONTENTS OF 1 C VIA HANDIHALER PO D.    Refill:  12

## 2016-02-02 DIAGNOSIS — I70249 Atherosclerosis of native arteries of left leg with ulceration of unspecified site: Secondary | ICD-10-CM | POA: Diagnosis not present

## 2016-02-02 DIAGNOSIS — L97419 Non-pressure chronic ulcer of right heel and midfoot with unspecified severity: Secondary | ICD-10-CM | POA: Diagnosis not present

## 2016-02-02 DIAGNOSIS — J449 Chronic obstructive pulmonary disease, unspecified: Secondary | ICD-10-CM | POA: Diagnosis not present

## 2016-02-02 DIAGNOSIS — L97929 Non-pressure chronic ulcer of unspecified part of left lower leg with unspecified severity: Secondary | ICD-10-CM | POA: Diagnosis not present

## 2016-02-02 DIAGNOSIS — I70234 Atherosclerosis of native arteries of right leg with ulceration of heel and midfoot: Secondary | ICD-10-CM | POA: Diagnosis not present

## 2016-02-02 DIAGNOSIS — L97219 Non-pressure chronic ulcer of right calf with unspecified severity: Secondary | ICD-10-CM | POA: Diagnosis not present

## 2016-02-02 DIAGNOSIS — I70232 Atherosclerosis of native arteries of right leg with ulceration of calf: Secondary | ICD-10-CM | POA: Diagnosis not present

## 2016-02-02 DIAGNOSIS — L03116 Cellulitis of left lower limb: Secondary | ICD-10-CM | POA: Diagnosis not present

## 2016-02-02 DIAGNOSIS — Z792 Long term (current) use of antibiotics: Secondary | ICD-10-CM | POA: Diagnosis not present

## 2016-02-02 DIAGNOSIS — I10 Essential (primary) hypertension: Secondary | ICD-10-CM | POA: Diagnosis not present

## 2016-02-02 DIAGNOSIS — G35 Multiple sclerosis: Secondary | ICD-10-CM | POA: Diagnosis not present

## 2016-02-02 DIAGNOSIS — R339 Retention of urine, unspecified: Secondary | ICD-10-CM | POA: Diagnosis not present

## 2016-02-02 NOTE — Progress Notes (Signed)
[name], [name] ([id number]) Visit Report for 01/30/2016 Arrival Information Details Patient Name: [name], [name] Date of Service: 01/30/2016 3:30 PM Medical Record Number: [id number] Patient Account Number: [id number] Date of Birth/Sex: 17-Apr-1951 (65 y.o. Female) Treating RN: Cornell Barman Primary Care Physician: LADA, [facility] Other Clinician: Referring Physician: LADA, [place] Treating Physician/Extender: Melburn Hake, HOYT Weeks in Treatment: 10 Visit Information History Since Last Visit Added or deleted any medications: No Patient Arrived: Walker Any new allergies or adverse reactions: No Arrival Time: 15:48 Had a fall or experienced change in No Accompanied By: self activities of daily living that may affect Transfer Assistance: None risk of falls: Patient Identification Verified: Yes Signs or symptoms of abuse/neglect since last No Secondary Verification Process Completed: Yes visito Patient Requires Transmission-Based No Hospitalized since last visit: No Precautions: Has Dressing in Place as Prescribed: Yes Patient Has Alerts: Yes Pain Present Now: No Electronic Signature(s) Signed: 02/01/2016 4:28:52 PM By: Gretta Cool, RN, BSN, Kim RN, BSN Entered By: Gretta Cool, RN, BSN, Kim on 01/30/2016 15:48:50 [name] ([id number]) -------------------------------------------------------------------------------- Encounter Discharge Information Details Patient Name: [name] Date of Service: 01/30/2016 3:30 PM Medical Record Number: [id number] Patient Account Number: [id number] Date of Birth/Sex: 24-Apr-1951 (65 y.o. Female) Treating RN: Montey Hora Primary Care Physician: LADA, [place] Other Clinician: Referring Physician: LADA, [place] Treating Physician/Extender: STONE III, HOYT Weeks in Treatment: 10 Encounter Discharge Information Items Discharge Pain Level: 0 Discharge Condition: Stable Ambulatory Status: Walker Discharge Destination: Home Transportation: Private  Auto Accompanied By: self Schedule Follow-up Appointment: Yes Medication Reconciliation completed No and provided to Patient/Care Cobey Raineri: Provided on Clinical Summary of Care: 01/30/2016 Form Type Recipient Paper Patient BW Electronic Signature(s) Signed: 01/30/2016 5:18:01 PM By: Montey Hora Previous Signature: 01/30/2016 4:59:36 PM Version By: Ruthine Dose Entered By: Montey Hora on 01/30/2016 17:18:01 [name] ([id number]) -------------------------------------------------------------------------------- Lower Extremity Assessment Details Patient Name: [name] Date of Service: 01/30/2016 3:30 PM Medical Record Number: [id number] Patient Account Number: [id number] Date of Birth/Sex: 05/10/1950 (65 y.o. Female) Treating RN: Cornell Barman Primary Care Physician: LADA, [facility] Other Clinician: Referring Physician: LADA, [place] Treating Physician/Extender: STONE III, HOYT Weeks in Treatment: 10 Vascular Assessment Pulses: Posterior Tibial Dorsalis Pedis Palpable: [Left:Yes] [Right:Yes] Extremity colors, hair growth, and conditions: Extremity Color: [Left:Red] [Right:Normal] Hair Growth on Extremity: [Left:No] [Right:Yes] Temperature of Extremity: [Left:Warm] [Right:Warm] Capillary Refill: [Left:< 3 seconds] [Right:< 3 seconds] Dependent Rubor: [Left:No] [Right:No] Blanched when Elevated: [Left:No] [Right:No] Lipodermatosclerosis: [Left:No] [Right:No] Toe Nail Assessment Left: Right: Thick: No No Discolored: No No Deformed: No No Improper Length and Hygiene: Yes Yes Electronic Signature(s) Signed: 02/01/2016 4:28:52 PM By: Gretta Cool, RN, BSN, Kim RN, BSN Entered By: Gretta Cool, RN, BSN, Kim on 01/30/2016 15:52:22 [name] ([id number]) -------------------------------------------------------------------------------- Multi Wound Chart Details Patient Name: [name] Date of Service: 01/30/2016 3:30 PM Medical Record Number: [id number] Patient Account  Number: [id number] Date of Birth/Sex: 19-May-1950 (65 y.o. Female) Treating RN: Cornell Barman Primary Care Physician: LADA, [facility] Other Clinician: Referring Physician: LADA, MELINDA Treating Physician/Extender: STONE III, HOYT Weeks in Treatment: 10 Vital Signs Height(in): 63 Pulse(bpm): 90 Weight(lbs): 270 Blood Pressure 132/67 (mmHg): Body Mass Index(BMI): 48 Temperature(F): 98.4 Respiratory Rate 20 (breaths/min): Photos: [2:No Photos] [3:No Photos] [4:No Photos] Wound Location: [2:Right Foot - Dorsal] [3:Right Lower Leg] [4:Left Lower Leg - Lateral, Proximal] Wounding Event: [2:Trauma] [3:Trauma] [4:Trauma] Primary Etiology: [2:Trauma, Other] [3:Trauma, Other] [4:Venous Leg Ulcer] Comorbid History: [2:Cataracts, Chronic Obstructive Pulmonary Disease (COPD), Sleep Disease (COPD), Sleep Disease (COPD), Sleep Apnea, Hypertension, Osteoarthritis, Neuropathy Osteoarthritis, Neuropathy  Osteoarthritis, Neuropathy] [3:Cataracts, Chronic  Obstructive Pulmonary Apnea, Hypertension,] [4:Cataracts, Chronic Obstructive Pulmonary Apnea, Hypertension,] Date Acquired: [2:07/21/2015] [3:10/09/2015] [4:10/09/2015] Weeks of Treatment: [2:10] [3:10] [4:10] Wound Status: [2:Open] [3:Open] [4:Open] Clustered Wound: [2:No] [3:No] [4:Yes] Measurements L x W x D 0.9x1x0.1 [3:2x1.8x0.1] [4:7x7x0.1] (cm) Area (cm) : [2:0.707] [3:2.827] [4:38.485] Volume (cm) : [2:0.071] [3:0.283] [4:3.848] % Reduction in Area: [2:-79.90%] [3:-471.10%] [4:-40841.50%] % Reduction in Volume: 10.10% [3:-91.20%] [4:-42655.60%] Classification: [2:Partial Thickness] [3:Partial Thickness] [4:Partial Thickness] Exudate Amount: [2:Large] [3:Medium] [4:Medium] Exudate Type: [2:Serous] [3:Serous] [4:Serous] Exudate Color: [2:amber] [3:amber] [4:amber] Wound Margin: [2:Flat and Intact] [3:Flat and Intact] [4:Flat and Intact] Granulation Amount: [2:None Present (0%)] [3:None Present (0%)] [4:Medium (34-66%)] Granulation Quality:  [2:N/A] [3:N/A] [4:Pale] Necrotic Amount: [2:Large (67-100%)] [3:Large (67-100%)] [4:Medium (34-66%)] Exposed Structures: [2:Fascia: No Fat: No] [3:Fascia: No Fat: No] [4:Fascia: No Fat: No] Tendon: No Tendon: No Tendon: No Muscle: No Muscle: No Muscle: No Joint: No Joint: No Joint: No Bone: No Bone: No Bone: No Limited to Skin Limited to Skin Limited to Skin Breakdown Breakdown Breakdown Epithelialization: None N/A None Periwound Skin Texture: Edema: Yes Edema: No Edema: No Excoriation: No Excoriation: No Excoriation: No Induration: No Induration: No Induration: No Callus: No Callus: No Callus: No Crepitus: No Crepitus: No Crepitus: No Fluctuance: No Fluctuance: No Fluctuance: No Friable: No Friable: No Friable: No Rash: No Rash: No Rash: No Scarring: No Scarring: No Scarring: No Periwound Skin Maceration: No Maceration: No Maceration: No Moisture: Moist: No Moist: No Moist: No Dry/Scaly: No Dry/Scaly: No Dry/Scaly: No Periwound Skin Color: Ecchymosis: Yes Atrophie Blanche: No Atrophie Blanche: No Atrophie Blanche: No Cyanosis: No Cyanosis: No Cyanosis: No Ecchymosis: No Ecchymosis: No Erythema: No Erythema: No Erythema: No Hemosiderin Staining: No Hemosiderin Staining: No Hemosiderin Staining: No Mottled: No Mottled: No Mottled: No Pallor: No Pallor: No Pallor: No Rubor: No Rubor: No Rubor: No Tenderness on No No No Palpation: Wound Preparation: Ulcer Cleansing: Ulcer Cleansing: Ulcer Cleansing: Rinsed/Irrigated with Rinsed/Irrigated with Rinsed/Irrigated with Saline Saline Saline Topical Anesthetic Topical Anesthetic Applied: Other: lidocaine Applied: Other: lidocaine 4% 4% Wound Number: 5 6 N/A Photos: No Photos No Photos N/A Wound Location: Left Lower Leg - Lateral, Left Lower Leg - Anterior N/A Distal Wounding Event: Gradually Appeared Gradually Appeared N/A Primary Etiology: Venous Leg Ulcer Venous Leg Ulcer  N/A Comorbid History: Cataracts, Chronic Cataracts, Chronic N/A Obstructive Pulmonary Obstructive Pulmonary Disease (COPD), Sleep Disease (COPD), Sleep Apnea, Hypertension, Apnea, Hypertension, Osteoarthritis, Neuropathy Osteoarthritis, Neuropathy Date Acquired: 12/21/2015 12/21/2015 N/A Weeks of Treatment: 5 5 N/A [name], [name] H. ([id number]) Wound Status: Open Open N/A Clustered Wound: No No N/A Measurements L x W x D 2x2.5x0.2 8x4x0.1 N/A (cm) Area (cm) : 3.927 25.133 N/A Volume (cm) : 0.785 2.513 N/A % Reduction in Area: -614.00% -5612.00% N/A % Reduction in Volume: -1327.30% -5611.40% N/A Classification: Partial Thickness Partial Thickness N/A Exudate Amount: Medium Large N/A Exudate Type: Serous Serosanguineous N/A Exudate Color: amber red, brown N/A Wound Margin: Flat and Intact Flat and Intact N/A Granulation Amount: Medium (34-66%) None Present (0%) N/A Granulation Quality: Pink, Pale N/A N/A Necrotic Amount: Medium (34-66%) Large (67-100%) N/A Exposed Structures: Fascia: No Fascia: No N/A Fat: No Fat: No Tendon: No Tendon: No Muscle: No Muscle: No Joint: No Joint: No Bone: No Bone: No Limited to Skin Limited to Skin Breakdown Breakdown Epithelialization: N/A N/A N/A Periwound Skin Texture: Edema: No Edema: No N/A Excoriation: No Excoriation: No Induration: No Induration: No Callus: No Callus: No Crepitus: No Crepitus: No Fluctuance: No Fluctuance: No  Friable: No Friable: No Rash: No Rash: No Scarring: No Scarring: No Periwound Skin Moist: Yes Maceration: No N/A Moisture: Maceration: No Moist: No Dry/Scaly: No Dry/Scaly: No Periwound Skin Color: Atrophie Blanche: No Atrophie Blanche: No N/A Cyanosis: No Cyanosis: No Ecchymosis: No Ecchymosis: No Erythema: No Erythema: No Hemosiderin Staining: No Hemosiderin Staining: No Mottled: No Mottled: No Pallor: No Pallor: No Rubor: No Rubor: No Tenderness on No No N/A Palpation: Wound  Preparation: Ulcer Cleansing: Ulcer Cleansing: N/A Rinsed/Irrigated with Rinsed/Irrigated with Saline Saline [name], [name] H. ([id number]) Topical Anesthetic Applied: Other: lidocaine 4% Treatment Notes Electronic Signature(s) Signed: 02/01/2016 4:28:52 PM By: Gretta Cool, RN, BSN, Kim RN, BSN Entered By: Gretta Cool, RN, BSN, Kim on 01/30/2016 15:57:31 [name] -------------------------------------------------------------------------------- Multi-Disciplinary Care Plan Details Patient Name: [name] Date of Service: 01/30/2016 3:30 PM Medical Record Number: [id number] Patient Account Number: [id number] Date of Birth/Sex: November 23, 1950 (65 y.o. Female) Treating RN: Cornell Barman Primary Care Physician: LADA, [facility] Other Clinician: Referring Physician: LADA, [place] Treating Physician/Extender: STONE III, HOYT Weeks in Treatment: 10 Active Inactive Abuse / Safety / Falls / Self Care Management Nursing Diagnoses: Impaired physical mobility Potential for falls Goals: Patient will remain injury free Date Initiated: 11/20/2015 Goal Status: Active Interventions: Assess fall risk on admission and as needed Notes: Nutrition Nursing Diagnoses: Imbalanced nutrition Goals: Patient/caregiver agrees to and verbalizes understanding of need to use nutritional supplements and/or vitamins as prescribed Date Initiated: 11/20/2015 Goal Status: Active Interventions: Provide education on nutrition Notes: Orientation to the Wound Care Program Nursing Diagnoses: Knowledge deficit related to the wound healing center program Goals: CAMBRY, DENG ([id number]) Patient/caregiver will verbalize understanding of the [place] Program Date Initiated: 11/20/2015 Goal Status: Active Interventions: Provide education on orientation to the wound center Notes: Wound/Skin Impairment Nursing Diagnoses: Impaired tissue integrity Goals: Patient will demonstrate a reduced rate of  smoking or cessation of smoking Date Initiated: 11/20/2015 Goal Status: Active Ulcer/skin breakdown will heal within 14 weeks Date Initiated: 11/20/2015 Goal Status: Active Interventions: Assess patient/caregiver ability to obtain necessary supplies Provide education on smoking Notes: Electronic Signature(s) Signed: 02/01/2016 4:28:52 PM By: Gretta Cool, RN, BSN, Kim RN, BSN Entered By: Gretta Cool, RN, BSN, Kim on 01/30/2016 15:57:22 [name], [name] -------------------------------------------------------------------------------- Pain Assessment Details Patient Name: [name] Date of Service: 01/30/2016 3:30 PM Medical Record Number: [id number] Patient Account Number: [id number] Date of Birth/Sex: 01-20-1951 (65 y.o. Female) Treating RN: Cornell Barman Primary Care Physician: LADA, [facility] Other Clinician: Referring Physician: LADA, [place] Treating Physician/Extender: STONE III, HOYT Weeks in Treatment: 10 Active Problems Location of Pain Severity and Description of Pain Patient Has Paino Yes Site Locations Pain Location: Generalized Pain With Dressing Change: No Character of Pain Describe the Pain: Shooting, Tender, Throbbing Pain Management and Medication Current Pain Management: Electronic Signature(s) Signed: 02/01/2016 4:28:52 PM By: Gretta Cool, RN, BSN, Kim RN, BSN Entered By: Gretta Cool, RN, BSN, Kim on 01/30/2016 15:49:11 [name] -------------------------------------------------------------------------------- Patient/Caregiver Education Details Patient Name: [name] Date of Service: 01/30/2016 3:30 PM Medical Record Number: [id number] Patient Account Number: [id number] Date of Birth/Gender: [birth year] (65 y.o. Female) Treating RN: Montey Hora Primary Care Physician: LADA, [place] Other Clinician: Referring Physician: LADA, [place] Treating Physician/Extender: Melburn Hake, HOYT Weeks in Treatment: 10 Education Assessment Education Provided  To: Patient Education Topics Provided Venous: Handouts: Other: wrap precautions Methods: Demonstration, Explain/Verbal Responses: State content correctly Electronic Signature(s) Signed: 01/30/2016 5:47:08 PM By: Montey Hora Entered By: Montey Hora on 01/30/2016 17:18:16 [name], [name] --------------------------------------------------------------------------------  Wound Assessment Details Patient Name: ROZALYNN, ENSLOW Date of Service: 01/30/2016 3:30 PM Medical Record Number: [id number] Patient Account Number: [id number] Date of Birth/Sex: 10/16/1950 (65 y.o. Female) Treating RN: Cornell Barman Primary Care Physician: LADA, [place] Other Clinician: Referring Physician: LADA, [place] Treating Physician/Extender: STONE III, HOYT Weeks in Treatment: 10 Wound Status Wound Number: 2 Primary Trauma, Other Etiology: Wound Location: Right Foot - Dorsal Wound Open Wounding Event: Trauma Status: Date Acquired: 07/21/2015 Comorbid Cataracts, Chronic Obstructive Weeks Of Treatment: 10 History: Pulmonary Disease (COPD), Sleep Clustered Wound: No Apnea, Hypertension, Osteoarthritis, Neuropathy Photos Photo Uploaded By: Regan Lemming on 01/30/2016 16:56:03 Wound Measurements Length: (cm) 0.9 Width: (cm) 1 Depth: (cm) 0.1 Area: (cm) 0.707 Volume: (cm) 0.071 % Reduction in Area: -79.9% % Reduction in Volume: 10.1% Epithelialization: None Wound Description Classification: Partial Thickness Wound Margin: Flat and Intact Exudate Amount: Large Exudate Type: Serous Exudate Color: amber Wound Bed Granulation Amount: None Present (0%) Exposed Structure Necrotic Amount: Large (67-100%) Fascia Exposed: No Necrotic Quality: Adherent Slough Fat Layer Exposed: No [name], [name] H. ([id number]) Tendon Exposed: No Muscle Exposed: No Joint Exposed: No Bone Exposed: No Limited to Skin Breakdown Periwound Skin Texture Texture Color No Abnormalities Noted: No No  Abnormalities Noted: No Callus: No Atrophie Blanche: No Crepitus: No Cyanosis: No Excoriation: No Ecchymosis: Yes Fluctuance: No Erythema: No Friable: No Hemosiderin Staining: No Induration: No Mottled: No Localized Edema: Yes Pallor: No Rash: No Rubor: No Scarring: No Moisture No Abnormalities Noted: No Dry / Scaly: No Maceration: No Moist: No Wound Preparation Ulcer Cleansing: Rinsed/Irrigated with Saline Topical Anesthetic Applied: Other: lidocaine 4%, Treatment Notes Wound #2 (Right, Dorsal Foot) 1. Cleansed with: Clean wound with Normal Saline 2. Anesthetic Topical Lidocaine 4% cream to wound bed prior to debridement 4. Dressing Applied: Santyl Ointment 5. Secondary Dressing Applied ABD Pad 7. Secured with 2 Layer Lite Compression System - Agricultural engineer) Signed: 02/01/2016 4:28:52 PM By: Gretta Cool, RN, BSN, Kim RN, BSN Entered By: Gretta Cool, RN, BSN, Kim on 01/30/2016 15:53:00 [name] ([id number]) -------------------------------------------------------------------------------- Wound Assessment Details Patient Name: [name] Date of Service: 01/30/2016 3:30 PM Medical Record Number: [id number] Patient Account Number: [id number] Date of Birth/Sex: 1950-07-21 (65 y.o. Female) Treating RN: Cornell Barman Primary Care Physician: LADA, [place] Other Clinician: Referring Physician: LADA, MELINDA Treating Physician/Extender: STONE III, HOYT Weeks in Treatment: 10 Wound Status Wound Number: 3 Primary Trauma, Other Etiology: Wound Location: Right Lower Leg Wound Open Wounding Event: Trauma Status: Date Acquired: 10/09/2015 Comorbid Cataracts, Chronic Obstructive Weeks Of Treatment: 10 History: Pulmonary Disease (COPD), Sleep Clustered Wound: No Apnea, Hypertension, Osteoarthritis, Neuropathy Photos Photo Uploaded By: Regan Lemming on 01/30/2016 16:56:03 Wound Measurements Length: (cm) 2 Width: (cm) 1.8 Depth: (cm) 0.1 Area: (cm)  2.827 Volume: (cm) 0.283 % Reduction in Area: -471.1% % Reduction in Volume: -91.2% Wound Description Classification: Partial Thickness Wound Margin: Flat and Intact Exudate Amount: Medium [name], [name] H. ([id number]) Exudate Type: Serous Exudate Color: amber Wound Bed Granulation Amount: None Present (0%) Exposed Structure Necrotic Amount: Large (67-100%) Fascia Exposed: No Necrotic Quality: Adherent Slough Fat Layer Exposed: No Tendon Exposed: No Muscle Exposed: No Joint Exposed: No Bone Exposed: No Limited to Skin Breakdown Periwound Skin Texture Texture Color No Abnormalities Noted: No No Abnormalities Noted: No Callus: No Atrophie Blanche: No Crepitus: No Cyanosis: No Excoriation: No Ecchymosis: No Fluctuance: No Erythema: No Friable: No Hemosiderin Staining: No Induration: No Mottled: No Localized Edema: No Pallor: No Rash: No Rubor: No Scarring: No Moisture No Abnormalities Noted:  No Dry / Scaly: No Maceration: No Moist: No Wound Preparation Ulcer Cleansing: Rinsed/Irrigated with Saline Treatment Notes Wound #3 (Right Lower Leg) 1. Cleansed with: Clean wound with Normal Saline 2. Anesthetic Topical Lidocaine 4% cream to wound bed prior to debridement 4. Dressing Applied: Santyl Ointment 5. Secondary Dressing Applied ABD Pad 7. Secured with 2 Layer Lite Compression System - Bilateral [name], [name] ([id number]) Electronic Signature(s) Signed: 02/01/2016 4:28:52 PM By: Gretta Cool, RN, BSN, Kim RN, BSN Entered By: Gretta Cool, RN, BSN, Kim on 01/30/2016 15:53:50 [name] ([id number]) -------------------------------------------------------------------------------- Wound Assessment Details Patient Name: [name] Date of Service: 01/30/2016 3:30 PM Medical Record Number: [id number] Patient Account Number: [id number] Date of Birth/Sex: [birth year] (65 y.o. Female) Treating RN: Cornell Barman Primary Care Physician: LADA, [place] Other  Clinician: Referring Physician: LADA, MELINDA Treating Physician/Extender: STONE III, HOYT Weeks in Treatment: 10 Wound Status Wound Number: 4 Primary Venous Leg Ulcer Etiology: Wound Location: Left Lower Leg - Lateral, Proximal Wound Open Status: Wounding Event: Trauma Comorbid Cataracts, Chronic Obstructive Date Acquired: 10/09/2015 History: Pulmonary Disease (COPD), Sleep Weeks Of Treatment: 10 Apnea, Hypertension, Osteoarthritis, Clustered Wound: Yes Neuropathy Photos Photo Uploaded By: Regan Lemming on 01/30/2016 16:57:26 Wound Measurements Length: (cm) 7 Width: (cm) 7 Depth: (cm) 0.1 Area: (cm) 38.485 Volume: (cm) 3.848 % Reduction in Area: -40841.5% % Reduction in Volume: -42655.6% Epithelialization: None Tunneling: No Undermining: No Wound Description Classification: Partial Thickness Wound Margin: Flat and Intact Exudate Amount: Medium [name], [name] H. ([id number]) Exudate Type: Serous Exudate Color: amber Wound Bed Granulation Amount: Medium (34-66%) Exposed Structure Granulation Quality: Pale Fascia Exposed: No Necrotic Amount: Medium (34-66%) Fat Layer Exposed: No Necrotic Quality: Adherent Slough Tendon Exposed: No Muscle Exposed: No Joint Exposed: No Bone Exposed: No Limited to Skin Breakdown Periwound Skin Texture Texture Color No Abnormalities Noted: No No Abnormalities Noted: No Callus: No Atrophie Blanche: No Crepitus: No Cyanosis: No Excoriation: Yes Ecchymosis: No Fluctuance: No Erythema: Yes Friable: No Hemosiderin Staining: No Induration: No Mottled: No Localized Edema: No Pallor: No Rash: No Rubor: No Scarring: No Moisture No Abnormalities Noted: No Dry / Scaly: No Maceration: No Moist: Yes Wound Preparation Ulcer Cleansing: Rinsed/Irrigated with Saline Topical Anesthetic Applied: Other: lidocaine 4%, Treatment Notes Wound #4 (Left, Proximal, Lateral Lower Leg) 1. Cleansed with: Clean wound with Normal Saline 2.  Anesthetic Topical Lidocaine 4% cream to wound bed prior to debridement 4. Dressing Applied: Santyl Ointment 5. Secondary Dressing Applied ABD Pad 7. Secured with [name], [name] ([id number]) 2 Layer Lite Compression System - Bilateral Electronic Signature(s) Signed: 02/01/2016 4:28:52 PM By: Gretta Cool, RN, BSN, Kim RN, BSN Entered By: Gretta Cool, RN, BSN, Kim on 01/30/2016 16:01:46 [name] ([id number]) -------------------------------------------------------------------------------- Wound Assessment Details Patient Name: [name] Date of Service: 01/30/2016 3:30 PM Medical Record Number: [id number] Patient Account Number: [id number] Date of Birth/Sex: 1950/10/23 (65 y.o. Female) Treating RN: Cornell Barman Primary Care Physician: LADA, [place] Other Clinician: Referring Physician: LADA, MELINDA Treating Physician/Extender: STONE III, HOYT Weeks in Treatment: 10 Wound Status Wound Number: 5 Primary Venous Leg Ulcer Etiology: Wound Location: Left Lower Leg - Lateral, Distal Wound Open Wounding Event: Gradually Appeared Status: Date Acquired: 12/21/2015 Comorbid Cataracts, Chronic Obstructive Weeks Of Treatment: 5 History: Pulmonary Disease (COPD), Sleep Clustered Wound: No Apnea, Hypertension, Osteoarthritis, Neuropathy Photos Photo Uploaded By: Regan Lemming on 01/30/2016 16:57:27 Wound Measurements Length: (cm) 2 Width: (cm) 2.5 Depth: (cm) 0.2 Area: (cm) 3.927 Volume: (cm) 0.785 % Reduction in Area: -614% % Reduction in Volume: -1327.3% Wound Description  Classification: Partial Thickness Wound Margin: Flat and Intact Exudate Amount: Medium Exudate Type: Serous Exudate Color: amber Wound Bed Granulation Amount: Medium (34-66%) Exposed Structure Granulation Quality: Pink, Pale Fascia Exposed: No Necrotic Amount: Medium (34-66%) Fat Layer Exposed: No [name], [place] H. ([id number]) Necrotic Quality: Adherent Slough Tendon Exposed: No Muscle Exposed: No Joint  Exposed: No Bone Exposed: No Limited to Skin Breakdown Periwound Skin Texture Texture Color No Abnormalities Noted: No No Abnormalities Noted: No Callus: No Atrophie Blanche: No Crepitus: No Cyanosis: No Excoriation: No Ecchymosis: No Fluctuance: No Erythema: No Friable: No Hemosiderin Staining: No Induration: No Mottled: No Localized Edema: No Pallor: No Rash: No Rubor: No Scarring: No Moisture No Abnormalities Noted: No Dry / Scaly: No Maceration: No Moist: Yes Wound Preparation Ulcer Cleansing: Rinsed/Irrigated with Saline Treatment Notes Wound #5 (Left, Distal, Lateral Lower Leg) 1. Cleansed with: Clean wound with Normal Saline 2. Anesthetic Topical Lidocaine 4% cream to wound bed prior to debridement 4. Dressing Applied: Santyl Ointment 5. Secondary Dressing Applied ABD Pad 7. Secured with 2 Layer Lite Compression System - Agricultural engineer) Signed: 02/01/2016 4:28:52 PM By: Gretta Cool, RN, BSN, Kim RN, BSN Entered By: Gretta Cool, RN, BSN, Kim on 01/30/2016 15:55:08 [name] ([id number]) -------------------------------------------------------------------------------- Wound Assessment Details Patient Name: [name] Date of Service: 01/30/2016 3:30 PM Medical Record Number: [id number] Patient Account Number: [id number] Date of Birth/Sex: Sep 21, 1950 (65 y.o. Female) Treating RN: Cornell Barman Primary Care Physician: LADA, [place] Other Clinician: Referring Physician: LADA, MELINDA Treating Physician/Extender: STONE III, HOYT Weeks in Treatment: 10 Wound Status Wound Number: 6 Primary Venous Leg Ulcer Etiology: Wound Location: Left Lower Leg - Anterior Wound Open Wounding Event: Gradually Appeared Status: Date Acquired: 12/21/2015 Comorbid Cataracts, Chronic Obstructive Weeks Of Treatment: 5 History: Pulmonary Disease (COPD), Sleep Clustered Wound: No Apnea, Hypertension, Osteoarthritis, Neuropathy Photos Photo Uploaded By: Regan Lemming on 01/30/2016 16:57:43 Wound Measurements Length: (cm) 8 Width: (cm) 4 Depth: (cm) 0.1 Area: (cm) 25.133 Volume: (cm) 2.513 % Reduction in Area: -5612% % Reduction in Volume: -5611.4% Wound Description Classification: Partial Thickness Wound Margin: Flat and Intact Exudate Amount: Large Lame, [place]ah H. ([id number]) Exudate Type: Serosanguineous Exudate Color: red, brown Wound Bed Granulation Amount: None Present (0%) Exposed Structure Necrotic Amount: Large (67-100%) Fascia Exposed: No Necrotic Quality: Adherent Slough Fat Layer Exposed: No Tendon Exposed: No Muscle Exposed: No Joint Exposed: No Bone Exposed: No Limited to Skin Breakdown Periwound Skin Texture Texture Color No Abnormalities Noted: No No Abnormalities Noted: No Callus: No Atrophie Blanche: No Crepitus: No Cyanosis: No Excoriation: No Ecchymosis: No Fluctuance: No Erythema: No Friable: No Hemosiderin Staining: No Induration: No Mottled: No Localized Edema: No Pallor: No Rash: No Rubor: No Scarring: No Moisture No Abnormalities Noted: No Dry / Scaly: No Maceration: No Moist: No Wound Preparation Ulcer Cleansing: Rinsed/Irrigated with Saline Topical Anesthetic Applied: Other: lidocaine 4%, Treatment Notes Wound #6 (Left, Anterior Lower Leg) 1. Cleansed with: Clean wound with Normal Saline 2. Anesthetic Topical Lidocaine 4% cream to wound bed prior to debridement 4. Dressing Applied: Santyl Ointment 5. Secondary Dressing Applied ABD Pad 7. Secured with NAREH, YOUNGHANS ([id number]) 2 Layer Lite Compression System - Bilateral Electronic Signature(s) Signed: 02/01/2016 4:28:52 PM By: Gretta Cool, RN, BSN, Kim RN, BSN Entered By: Gretta Cool, RN, BSN, Kim on 01/30/2016 15:57:07 [name] ([id number]) -------------------------------------------------------------------------------- [place] Details Patient Name: [name] Date of Service: 01/30/2016 3:30 PM Medical Record Number:  [id number] Patient Account Number: [id number] Date of Birth/Sex: April 12, 1951 (65 y.o. Female) Treating RN:  Cornell Barman Primary Care Physician: LADA, [facility] Other Clinician: Referring Physician: LADA, [place] Treating Physician/Extender: STONE III, HOYT Weeks in Treatment: 10 Vital Signs Time Taken: 15:49 Temperature (F): 98.4 Height (in): 63 Pulse (bpm): 90 Weight (lbs): 270 Respiratory Rate (breaths/min): 20 Body Mass Index (BMI): 47.8 Blood Pressure (mmHg): 132/67 Reference Range: 80 - 120 mg / dl Electronic Signature(s) Signed: 02/01/2016 4:28:52 PM By: Gretta Cool, RN, BSN, Kim RN, BSN Entered By: Gretta Cool, RN, BSN, Kim on 01/30/2016 15:51:15

## 2016-02-05 DIAGNOSIS — I10 Essential (primary) hypertension: Secondary | ICD-10-CM | POA: Diagnosis not present

## 2016-02-05 DIAGNOSIS — R339 Retention of urine, unspecified: Secondary | ICD-10-CM | POA: Diagnosis not present

## 2016-02-05 DIAGNOSIS — L97929 Non-pressure chronic ulcer of unspecified part of left lower leg with unspecified severity: Secondary | ICD-10-CM | POA: Diagnosis not present

## 2016-02-05 DIAGNOSIS — L97419 Non-pressure chronic ulcer of right heel and midfoot with unspecified severity: Secondary | ICD-10-CM | POA: Diagnosis not present

## 2016-02-05 DIAGNOSIS — I70249 Atherosclerosis of native arteries of left leg with ulceration of unspecified site: Secondary | ICD-10-CM | POA: Diagnosis not present

## 2016-02-05 DIAGNOSIS — J449 Chronic obstructive pulmonary disease, unspecified: Secondary | ICD-10-CM | POA: Diagnosis not present

## 2016-02-05 DIAGNOSIS — Z792 Long term (current) use of antibiotics: Secondary | ICD-10-CM | POA: Diagnosis not present

## 2016-02-05 DIAGNOSIS — L97219 Non-pressure chronic ulcer of right calf with unspecified severity: Secondary | ICD-10-CM | POA: Diagnosis not present

## 2016-02-05 DIAGNOSIS — L03116 Cellulitis of left lower limb: Secondary | ICD-10-CM | POA: Diagnosis not present

## 2016-02-05 DIAGNOSIS — I70234 Atherosclerosis of native arteries of right leg with ulceration of heel and midfoot: Secondary | ICD-10-CM | POA: Diagnosis not present

## 2016-02-05 DIAGNOSIS — G35 Multiple sclerosis: Secondary | ICD-10-CM | POA: Diagnosis not present

## 2016-02-05 DIAGNOSIS — I70232 Atherosclerosis of native arteries of right leg with ulceration of calf: Secondary | ICD-10-CM | POA: Diagnosis not present

## 2016-02-06 ENCOUNTER — Encounter: Payer: Medicare Other | Admitting: Physician Assistant

## 2016-02-06 DIAGNOSIS — G4733 Obstructive sleep apnea (adult) (pediatric): Secondary | ICD-10-CM | POA: Diagnosis not present

## 2016-02-06 DIAGNOSIS — Z79899 Other long term (current) drug therapy: Secondary | ICD-10-CM | POA: Diagnosis not present

## 2016-02-06 DIAGNOSIS — I10 Essential (primary) hypertension: Secondary | ICD-10-CM | POA: Diagnosis not present

## 2016-02-06 DIAGNOSIS — S91301A Unspecified open wound, right foot, initial encounter: Secondary | ICD-10-CM | POA: Diagnosis not present

## 2016-02-06 DIAGNOSIS — L97212 Non-pressure chronic ulcer of right calf with fat layer exposed: Secondary | ICD-10-CM | POA: Diagnosis not present

## 2016-02-06 DIAGNOSIS — J449 Chronic obstructive pulmonary disease, unspecified: Secondary | ICD-10-CM | POA: Diagnosis not present

## 2016-02-06 DIAGNOSIS — S81801A Unspecified open wound, right lower leg, initial encounter: Secondary | ICD-10-CM | POA: Diagnosis not present

## 2016-02-06 DIAGNOSIS — L97222 Non-pressure chronic ulcer of left calf with fat layer exposed: Secondary | ICD-10-CM | POA: Diagnosis not present

## 2016-02-06 DIAGNOSIS — I739 Peripheral vascular disease, unspecified: Secondary | ICD-10-CM | POA: Diagnosis not present

## 2016-02-06 DIAGNOSIS — I87312 Chronic venous hypertension (idiopathic) with ulcer of left lower extremity: Secondary | ICD-10-CM | POA: Diagnosis not present

## 2016-02-06 DIAGNOSIS — L97512 Non-pressure chronic ulcer of other part of right foot with fat layer exposed: Secondary | ICD-10-CM | POA: Diagnosis not present

## 2016-02-06 DIAGNOSIS — L97822 Non-pressure chronic ulcer of other part of left lower leg with fat layer exposed: Secondary | ICD-10-CM | POA: Diagnosis not present

## 2016-02-06 DIAGNOSIS — G35 Multiple sclerosis: Secondary | ICD-10-CM | POA: Diagnosis not present

## 2016-02-06 NOTE — Assessment & Plan Note (Signed)
Well controlled 

## 2016-02-06 NOTE — Assessment & Plan Note (Signed)
Praised patient for weight loss, keep trying

## 2016-02-06 NOTE — Assessment & Plan Note (Signed)
Talked with staff at wound care center; they will fax over the note; they said that if they thought an antibiotic was indicated, they could take care of that

## 2016-02-06 NOTE — Assessment & Plan Note (Signed)
So proud of patient for giving up her cigarettes; urged no second hand smoke exposure

## 2016-02-07 DIAGNOSIS — I70249 Atherosclerosis of native arteries of left leg with ulceration of unspecified site: Secondary | ICD-10-CM | POA: Diagnosis not present

## 2016-02-07 DIAGNOSIS — G35 Multiple sclerosis: Secondary | ICD-10-CM | POA: Diagnosis not present

## 2016-02-07 DIAGNOSIS — R339 Retention of urine, unspecified: Secondary | ICD-10-CM | POA: Diagnosis not present

## 2016-02-07 DIAGNOSIS — L97219 Non-pressure chronic ulcer of right calf with unspecified severity: Secondary | ICD-10-CM | POA: Diagnosis not present

## 2016-02-07 DIAGNOSIS — Z792 Long term (current) use of antibiotics: Secondary | ICD-10-CM | POA: Diagnosis not present

## 2016-02-07 DIAGNOSIS — L03116 Cellulitis of left lower limb: Secondary | ICD-10-CM | POA: Diagnosis not present

## 2016-02-07 DIAGNOSIS — J449 Chronic obstructive pulmonary disease, unspecified: Secondary | ICD-10-CM | POA: Diagnosis not present

## 2016-02-07 DIAGNOSIS — L97929 Non-pressure chronic ulcer of unspecified part of left lower leg with unspecified severity: Secondary | ICD-10-CM | POA: Diagnosis not present

## 2016-02-07 DIAGNOSIS — I70232 Atherosclerosis of native arteries of right leg with ulceration of calf: Secondary | ICD-10-CM | POA: Diagnosis not present

## 2016-02-07 DIAGNOSIS — I70234 Atherosclerosis of native arteries of right leg with ulceration of heel and midfoot: Secondary | ICD-10-CM | POA: Diagnosis not present

## 2016-02-07 DIAGNOSIS — I10 Essential (primary) hypertension: Secondary | ICD-10-CM | POA: Diagnosis not present

## 2016-02-07 DIAGNOSIS — L97419 Non-pressure chronic ulcer of right heel and midfoot with unspecified severity: Secondary | ICD-10-CM | POA: Diagnosis not present

## 2016-02-07 NOTE — Progress Notes (Signed)
JULES, WENSEL ([id number]) Visit Report for 02/06/2016 Arrival Information Details Patient Name: [name], [name] Date of Service: 02/06/2016 2:15 PM Medical Record Number: [id number] Patient Account Number: [id number] Date of Birth/Sex: [birth year] (65 y.o. Female) Treating RN: Carolyne Fiscal, Debi Primary Care Physician: LADA, Abie Other Clinician: Referring Physician: LADA, [place] Treating Physician/Extender: STONE III, HOYT Weeks in Treatment: 11 Visit Information History Since Last Visit All ordered tests and consults were completed: No Patient Arrived: Walker Added or deleted any medications: No Arrival Time: 14:31 Any new allergies or adverse reactions: No Accompanied By: self Had a fall or experienced change in No Transfer Assistance: [employer] activities of daily living that may affect Patient Lift risk of falls: Patient Identification Verified: Yes Signs or symptoms of abuse/neglect since last No Secondary Verification Process Yes visito Completed: Hospitalized since last visit: No Patient Requires Transmission- No Pain Present Now: Yes Based Precautions: Patient Has Alerts: Yes Electronic Signature(s) Signed: 02/07/2016 1:21:57 PM By: Alric Quan Entered By: Alric Quan on 02/06/2016 14:31:25 [name] ([id number]) -------------------------------------------------------------------------------- Encounter Discharge Information Details Patient Name: [name] Date of Service: 02/06/2016 2:15 PM Medical Record Number: [id number] Patient Account Number: [id number] Date of Birth/Sex: 18-Oct-1950 (65 y.o. Female) Treating RN: Carolyne Fiscal, Debi Primary Care Physician: LADA, [place] Other Clinician: Referring Physician: LADA, [place] Treating Physician/Extender: STONE III, HOYT Weeks in Treatment: 11 Encounter Discharge Information Items Discharge Pain Level: 5 Discharge Condition: Stable Ambulatory Status: Walker Discharge Destination:  Home Private Transportation: Auto Accompanied By: SELF Schedule Follow-up Appointment: Yes Medication Reconciliation completed and Yes provided to Patient/Care Oakley Orban: Clinical Summary of Care: Electronic Signature(s) Signed: 02/07/2016 1:21:57 PM By: Alric Quan Previous Signature: 02/06/2016 3:47:04 PM Version By: Ruthine Dose Entered By: Alric Quan on 02/06/2016 15:47:09 [name] ([id number]) -------------------------------------------------------------------------------- Lower Extremity Assessment Details Patient Name: [name] Date of Service: 02/06/2016 2:15 PM Medical Record Number: [id number] Patient Account Number: [id number] Date of Birth/Sex: [birth year] (65 y.o. Female) Treating RN: Carolyne Fiscal, Debi Primary Care Physician: LADA, [place] Other Clinician: Referring Physician: LADA, [place] Treating Physician/Extender: STONE III, HOYT Weeks in Treatment: 11 Vascular Assessment Pulses: Posterior Tibial Dorsalis Pedis Palpable: [Left:Yes] [Right:Yes] Extremity colors, hair growth, and conditions: Extremity Color: [Left:Red] [Right:Normal] Temperature of Extremity: [Left:Warm] [Right:Warm] Capillary Refill: [Left:< 3 seconds] [Right:< 3 seconds] Toe Nail Assessment Left: Right: Thick: No No Discolored: No No Deformed: No No Improper Length and Hygiene: Yes Yes Electronic Signature(s) Signed: 02/07/2016 1:21:57 PM By: Alric Quan Entered By: Alric Quan on 02/06/2016 14:34:33 [name], [name] ([id number]) -------------------------------------------------------------------------------- Multi Wound Chart Details Patient Name: [name] Date of Service: 02/06/2016 2:15 PM Medical Record Number: [id number] Patient Account Number: [id number] Date of Birth/Sex: 19-Feb-1951 (65 y.o. Female) Treating RN: Carolyne Fiscal, Debi Primary Care Physician: LADA, [place] Other Clinician: Referring Physician: LADA, MELINDA Treating  Physician/Extender: STONE III, HOYT Weeks in Treatment: 11 Vital Signs Height(in): 63 Pulse(bpm): 65 Weight(lbs): 270 Blood Pressure 125/64 (mmHg): Body Mass Index(BMI): 48 Temperature(F): 98.2 Respiratory Rate 20 (breaths/min): Photos: [2:No Photos] [3:No Photos] [4:No Photos] Wound Location: [2:Right Foot - Dorsal] [3:Right Lower Leg] [4:Left Lower Leg - Lateral, Proximal] Wounding Event: [2:Trauma] [3:Trauma] [4:Trauma] Primary Etiology: [2:Trauma, Other] [3:Trauma, Other] [4:Venous Leg Ulcer] Comorbid History: [2:Cataracts, Chronic Obstructive Pulmonary Disease (COPD), Sleep Disease (COPD), Sleep Disease (COPD), Sleep Apnea, Hypertension, Osteoarthritis, Neuropathy Osteoarthritis, Neuropathy Osteoarthritis, Neuropathy] [3:Cataracts, Chronic  Obstructive Pulmonary Apnea, Hypertension,] [4:Cataracts, Chronic Obstructive Pulmonary Apnea, Hypertension,] Date Acquired: [2:07/21/2015] [3:10/09/2015] [4:10/09/2015] Weeks of Treatment: [2:11] [3:11] [4:11] Wound Status: [2:Open] [3:Open] [4:Open] Clustered Wound: [2:No] [3:No] [  4:Yes] Measurements L x W x D 0.3x0.5x0.1 [3:2x1.7x0.2] [4:6.1x3x0.1] (cm) Area (cm) : [2:0.118] [3:2.67] [4:14.373] Volume (cm) : [2:0.012] [3:0.534] [4:1.437] % Reduction in Area: [2:70.00%] [3:-439.40%] [4:-15190.40%] % Reduction in Volume: 84.80% [3:-260.80%] [4:-15866.70%] Classification: [2:Partial Thickness] [3:Partial Thickness] [4:Partial Thickness] Exudate Amount: [2:Large] [3:Large] [4:Large] Exudate Type: [2:Serous] [3:Serous] [4:Serous] Exudate Color: [2:amber] [3:amber] [4:amber] Wound Margin: [2:Flat and Intact] [3:Flat and Intact] [4:Flat and Intact] Granulation Amount: [2:None Present (0%)] [3:None Present (0%)] [4:Medium (34-66%)] Granulation Quality: [2:N/A] [3:N/A] [4:Pale] Necrotic Amount: [2:Large (67-100%)] [3:Large (67-100%)] [4:Medium (34-66%)] Exposed Structures: [2:Fascia: No Fat: No] [3:Fascia: No Fat: No] [4:Fascia: No Fat:  No] Tendon: No Tendon: No Tendon: No Muscle: No Muscle: No Muscle: No Joint: No Joint: No Joint: No Bone: No Bone: No Bone: No Limited to Skin Limited to Skin Limited to Skin Breakdown Breakdown Breakdown Epithelialization: None None None Periwound Skin Texture: Edema: Yes Edema: No Excoriation: Yes Excoriation: No Excoriation: No Edema: No Induration: No Induration: No Induration: No Callus: No Callus: No Callus: No Crepitus: No Crepitus: No Crepitus: No Fluctuance: No Fluctuance: No Fluctuance: No Friable: No Friable: No Friable: No Rash: No Rash: No Rash: No Scarring: No Scarring: No Scarring: No Periwound Skin Maceration: No Maceration: No Moist: Yes Moisture: Moist: No Moist: No Maceration: No Dry/Scaly: No Dry/Scaly: No Dry/Scaly: No Periwound Skin Color: Ecchymosis: Yes Atrophie Blanche: No Erythema: Yes Atrophie Blanche: No Cyanosis: No Atrophie Blanche: No Cyanosis: No Ecchymosis: No Cyanosis: No Erythema: No Erythema: No Ecchymosis: No Hemosiderin Staining: No Hemosiderin Staining: No Hemosiderin Staining: No Mottled: No Mottled: No Mottled: No Pallor: No Pallor: No Pallor: No Rubor: No Rubor: No Rubor: No Erythema Location: N/A N/A N/A Temperature: N/A N/A N/A Tenderness on No No No Palpation: Wound Preparation: Ulcer Cleansing: Ulcer Cleansing: Ulcer Cleansing: Rinsed/Irrigated with Rinsed/Irrigated with Rinsed/Irrigated with Saline Saline Saline Topical Anesthetic Topical Anesthetic Topical Anesthetic Applied: Other: lidocaine Applied: Other: lidocaine Applied: Other: lidocaine 4% 4% 4% Wound Number: 5 6 7  Photos: No Photos No Photos No Photos Wound Location: Left Lower Leg - Lateral, Left Lower Leg - Anterior Left Lower Leg - Medial Distal Wounding Event: Gradually Appeared Gradually Appeared Gradually Appeared Primary Etiology: Venous Leg Ulcer Venous Leg Ulcer To be determined Comorbid History: Cataracts,  Chronic Cataracts, Chronic Cataracts, Chronic Obstructive Pulmonary Obstructive Pulmonary Obstructive Pulmonary Disease (COPD), Sleep Disease (COPD), Sleep Disease (COPD), Sleep Apnea, Hypertension, Apnea, Hypertension, Apnea, Hypertension, Osteoarthritis, Neuropathy Osteoarthritis, Neuropathy Osteoarthritis, Neuropathy [name], [name] Date Acquired: 12/21/2015 12/21/2015 02/06/2016 Weeks of Treatment: 6 6 0 Wound Status: Open Open Open Clustered Wound: No No No Measurements L x W x D 1.7x1.6x0.2 1.4x1.2x0.1 1x2.1x0.1 (cm) Area (cm) : 2.136 1.319 1.649 Volume (cm) : 0.427 0.132 0.165 % Reduction in Area: -288.40% -199.80% N/A % Reduction in Volume: -676.40% -200.00% N/A Classification: Partial Thickness Partial Thickness Partial Thickness Exudate Amount: Large Large Large Exudate Type: Serous Serous Serous Exudate Color: amber amber amber Wound Margin: Flat and Intact Flat and Intact Flat and Intact Granulation Amount: None Present (0%) None Present (0%) Medium (34-66%) Granulation Quality: N/A N/A Pink Necrotic Amount: Large (67-100%) Large (67-100%) Medium (34-66%) Exposed Structures: Fascia: No Fascia: No Fascia: No Fat: No Fat: No Fat: No Tendon: No Tendon: No Tendon: No Muscle: No Muscle: No Muscle: No Joint: No Joint: No Joint: No Bone: No Bone: No Bone: No Limited to Skin Limited to Skin Limited to Skin Breakdown Breakdown Breakdown Epithelialization: None None None Periwound Skin Texture: Edema: No Edema: No No Abnormalities Noted Excoriation:  No Excoriation: No Induration: No Induration: No Callus: No Callus: No Crepitus: No Crepitus: No Fluctuance: No Fluctuance: No Friable: No Friable: No Rash: No Rash: No Scarring: No Scarring: No Periwound Skin Moist: Yes Maceration: No Moist: Yes Moisture: Maceration: No Moist: No Dry/Scaly: No Dry/Scaly: No Periwound Skin Color: Atrophie Blanche: No Atrophie Blanche: No Erythema:  Yes Cyanosis: No Cyanosis: No Ecchymosis: No Ecchymosis: No Erythema: No Erythema: No Hemosiderin Staining: No Hemosiderin Staining: No Mottled: No Mottled: No Pallor: No Pallor: No Rubor: No Rubor: No Erythema Location: N/A N/A Circumferential Temperature: N/A N/A No Abnormality No No Yes [name], [name] H. ([id number]) Tenderness on Palpation: Wound Preparation: Ulcer Cleansing: Ulcer Cleansing: Ulcer Cleansing: Rinsed/Irrigated with Rinsed/Irrigated with Rinsed/Irrigated with Saline Saline Saline Topical Anesthetic Topical Anesthetic Topical Anesthetic Applied: Other: lidocaine Applied: Other: lidocaine Applied: Other: lidocaine 4% 4% 4% Treatment Notes Electronic Signature(s) Signed: 02/07/2016 1:21:57 PM By: Alric Quan Entered By: Alric Quan on 02/06/2016 15:11:10 [name] -------------------------------------------------------------------------------- [place] Details Patient Name: [name] Date of Service: 02/06/2016 2:15 PM Medical Record Number: [id number] Patient Account Number: [id number] Date of Birth/Sex: 05/26/1950 (65 y.o. Female) Treating RN: Carolyne Fiscal, Debi Primary Care Physician: LADA, [place] Other Clinician: Referring Physician: LADA, [place] Treating Physician/Extender: STONE III, HOYT Weeks in Treatment: 11 Active Inactive Abuse / Safety / Falls / Self Care Management Nursing Diagnoses: Impaired physical mobility Potential for falls Goals: Patient will remain injury free Date Initiated: 11/20/2015 Goal Status: Active Interventions: Assess fall risk on admission and as needed Notes: Nutrition Nursing Diagnoses: Imbalanced nutrition Goals: Patient/caregiver agrees to and verbalizes understanding of need to use nutritional supplements and/or vitamins as prescribed Date Initiated: 11/20/2015 Goal Status: Active Interventions: Provide education on nutrition Notes: Orientation to the  Wound Care Program Nursing Diagnoses: Knowledge deficit related to the wound healing center program Goals: LASHONTA, [place] ([id number]) Patient/caregiver will verbalize understanding of the [place] Program Date Initiated: 11/20/2015 Goal Status: Active Interventions: Provide education on orientation to the wound center Notes: Wound/Skin Impairment Nursing Diagnoses: Impaired tissue integrity Goals: Patient will demonstrate a reduced rate of smoking or cessation of smoking Date Initiated: 11/20/2015 Goal Status: Active Ulcer/skin breakdown will heal within 14 weeks Date Initiated: 11/20/2015 Goal Status: Active Interventions: Assess patient/caregiver ability to obtain necessary supplies Provide education on smoking Notes: Electronic Signature(s) Signed: 02/07/2016 1:21:57 PM By: Alric Quan Entered By: Alric Quan on 02/06/2016 15:11:01 [name] -------------------------------------------------------------------------------- Pain Assessment Details Patient Name: [name] Date of Service: 02/06/2016 2:15 PM Medical Record Number: [id number] Patient Account Number: [id number] Date of Birth/Sex: 11-26-1950 (65 y.o. Female) Treating RN: Carolyne Fiscal, Debi Primary Care Physician: LADA, [place] Other Clinician: Referring Physician: LADA, MELINDA Treating Physician/Extender: STONE III, HOYT Weeks in Treatment: 11 Active Problems Location of Pain Severity and Description of Pain Patient Has Paino Yes Site Locations Pain Location: Pain in Ulcers With Dressing Change: Yes Duration of the Pain. Constant / Intermittento Constant Rate the pain. Current Pain Level: 6 Worst Pain Level: 10 Least Pain Level: 2 Character of Pain Describe the Pain: Stabbing, Tender, Throbbing Pain Management and Medication Current Pain Management: Electronic Signature(s) Signed: 02/07/2016 1:21:57 PM By: Alric Quan Entered By: Alric Quan on  02/06/2016 14:31:46 [name] -------------------------------------------------------------------------------- Patient/Caregiver Education Details Patient Name: [name] Date of Service: 02/06/2016 2:15 PM Medical Record Number: [id number] Patient Account Number: [id number] Date of Birth/Gender: 1950/12/17 (65 y.o. Female) Treating RN: Ahmed Prima Primary Care Physician: LADA, [place] Other Clinician: Referring Physician:  LADA, MELINDA Treating Physician/Extender: [place] Hake, HOYT Weeks in Treatment: 11 Education Assessment Education Provided To: Patient Education Topics Provided Wound/Skin Impairment: Handouts: Other: change dressing as ordered and do not get wraps wet Methods: Demonstration, Explain/Verbal Responses: State content correctly Electronic Signature(s) Signed: 02/07/2016 1:21:57 PM By: Alric Quan Entered By: Alric Quan on 02/06/2016 15:47:30 [name] ([id number]) -------------------------------------------------------------------------------- Wound Assessment Details Patient Name: [name] Date of Service: 02/06/2016 2:15 PM Medical Record Number: [id number] Patient Account Number: [id number] Date of Birth/Sex: 01-10-1951 (65 y.o. Female) Treating RN: Carolyne Fiscal, Debi Primary Care Physician: LADA, [place] Other Clinician: Referring Physician: LADA, MELINDA Treating Physician/Extender: STONE III, HOYT Weeks in Treatment: 11 Wound Status Wound Number: 2 Primary Trauma, Other Etiology: Wound Location: Right Foot - Dorsal Wound Open Wounding Event: Trauma Status: Date Acquired: 07/21/2015 Comorbid Cataracts, Chronic Obstructive Weeks Of Treatment: 11 History: Pulmonary Disease (COPD), Sleep Clustered Wound: No Apnea, Hypertension, Osteoarthritis, Neuropathy Photos Photo Uploaded By: Alric Quan on 02/07/2016 07:54:42 Wound Measurements Length: (cm) 0.3 Width: (cm) 0.5 Depth: (cm) 0.1 Area: (cm)  0.118 Volume: (cm) 0.012 % Reduction in Area: 70% % Reduction in Volume: 84.8% Epithelialization: None Tunneling: No Undermining: No Wound Description Classification: Partial Thickness Wound Margin: Flat and Intact Exudate Amount: Large Exudate Type: Serous Exudate Color: amber Wound Bed Granulation Amount: None Present (0%) Exposed Structure Necrotic Amount: Large (67-100%) Fascia Exposed: No Necrotic Quality: Adherent Slough Fat Layer Exposed: No [place], Ulla H. ([id number]) Tendon Exposed: No Muscle Exposed: No Joint Exposed: No Bone Exposed: No Limited to Skin Breakdown Periwound Skin Texture Texture Color No Abnormalities Noted: No No Abnormalities Noted: No Callus: No Atrophie Blanche: No Crepitus: No Cyanosis: No Excoriation: No Ecchymosis: Yes Fluctuance: No Erythema: No Friable: No Hemosiderin Staining: No Induration: No Mottled: No Localized Edema: Yes Pallor: No Rash: No Rubor: No Scarring: No Moisture No Abnormalities Noted: No Dry / Scaly: No Maceration: No Moist: No Wound Preparation Ulcer Cleansing: Rinsed/Irrigated with Saline Topical Anesthetic Applied: Other: lidocaine 4%, Treatment Notes Wound #2 (Right, Dorsal Foot) 1. Cleansed with: Clean wound with Normal Saline Cleanse wound with antibacterial soap and water 2. Anesthetic Topical Lidocaine 4% cream to wound bed prior to debridement 4. Dressing Applied: Iodosorb Ointment 5. Secondary Dressing Applied ABD Pad Dry Gauze 7. Secured with Tape 2 Layer Compression System - Bilateral Electronic Signature(s) Signed: 02/07/2016 1:21:57 PM By: Lady Gary, [name] ([id number]) Entered By: Alric Quan on 02/06/2016 14:44:30 [name] ([id number]) -------------------------------------------------------------------------------- Wound Assessment Details Patient Name: [name] Date of Service: 02/06/2016 2:15 PM Medical Record Number: [id number] Patient  Account Number: [id number] Date of Birth/Sex: November 29, 1950 (65 y.o. Female) Treating RN: Carolyne Fiscal, Debi Primary Care Physician: LADA, [place] Other Clinician: Referring Physician: LADA, MELINDA Treating Physician/Extender: STONE III, HOYT Weeks in Treatment: 11 Wound Status Wound Number: 3 Primary Trauma, Other Etiology: Wound Location: Right Lower Leg Wound Open Wounding Event: Trauma Status: Date Acquired: 10/09/2015 Comorbid Cataracts, Chronic Obstructive Weeks Of Treatment: 11 History: Pulmonary Disease (COPD), Sleep Clustered Wound: No Apnea, Hypertension, Osteoarthritis, Neuropathy Photos Photo Uploaded By: Alric Quan on 02/07/2016 07:54:43 Wound Measurements Length: (cm) 2 Width: (cm) 1.7 Depth: (cm) 0.2 Area: (cm) 2.67 Volume: (cm) 0.534 % Reduction in Area: -439.4% % Reduction in Volume: -260.8% Epithelialization: None Tunneling: No Undermining: No Wound Description Classification: Partial Thickness Wound Margin: Flat and Intact Exudate Amount: Large Exudate Type: Serous Exudate Color: amber Wound Bed Granulation Amount: None Present (0%) Exposed Structure Necrotic Amount: Large (67-100%) Fascia Exposed: No Necrotic Quality: Adherent  Slough Fat Layer Exposed: No [name], [name] H. ([id number]) Tendon Exposed: No Muscle Exposed: No Joint Exposed: No Bone Exposed: No Limited to Skin Breakdown Periwound Skin Texture Texture Color No Abnormalities Noted: No No Abnormalities Noted: No Callus: No Atrophie Blanche: No Crepitus: No Cyanosis: No Excoriation: No Ecchymosis: No Fluctuance: No Erythema: No Friable: No Hemosiderin Staining: No Induration: No Mottled: No Localized Edema: No Pallor: No Rash: No Rubor: No Scarring: No Moisture No Abnormalities Noted: No Dry / Scaly: No Maceration: No Moist: No Wound Preparation Ulcer Cleansing: Rinsed/Irrigated with Saline Topical Anesthetic Applied: Other: lidocaine 4%, Treatment  Notes Wound #3 (Right Lower Leg) 1. Cleansed with: Clean wound with Normal Saline Cleanse wound with antibacterial soap and water 2. Anesthetic Topical Lidocaine 4% cream to wound bed prior to debridement 4. Dressing Applied: Iodosorb Ointment 5. Secondary Dressing Applied ABD Pad Dry Gauze 7. Secured with Tape 2 Layer Compression System - Bilateral Electronic Signature(s) Signed: 02/07/2016 1:21:57 PM By: Lady Gary, [name] ([id number]) Entered By: Alric Quan on 02/06/2016 14:45:14 [name] ([id number]) -------------------------------------------------------------------------------- Wound Assessment Details Patient Name: [name] Date of Service: 02/06/2016 2:15 PM Medical Record Number: [id number] Patient Account Number: [id number] Date of Birth/Sex: [birth year] (65 y.o. Female) Treating RN: Carolyne Fiscal, Debi Primary Care Physician: LADA, [place] Other Clinician: Referring Physician: LADA, MELINDA Treating Physician/Extender: STONE III, HOYT Weeks in Treatment: 11 Wound Status Wound Number: 4 Primary Venous Leg Ulcer Etiology: Wound Location: Left Lower Leg - Lateral, Proximal Wound Open Status: Wounding Event: Trauma Comorbid Cataracts, Chronic Obstructive Date Acquired: 10/09/2015 History: Pulmonary Disease (COPD), Sleep Weeks Of Treatment: 11 Apnea, Hypertension, Osteoarthritis, Clustered Wound: Yes Neuropathy Photos Photo Uploaded By: Alric Quan on 02/07/2016 07:55:05 Wound Measurements Length: (cm) 6.1 Width: (cm) 3 Depth: (cm) 0.1 Area: (cm) 14.373 Volume: (cm) 1.437 % Reduction in Area: -15190.4% % Reduction in Volume: -15866.7% Epithelialization: None Tunneling: No Undermining: No Wound Description Classification: Partial Thickness Wound Margin: Flat and Intact Exudate Amount: Large Exudate Type: Serous Exudate Color: amber Wound Bed Granulation Amount: Medium (34-66%) Exposed Structure Granulation  Quality: Pale Fascia Exposed: No Necrotic Amount: Medium (34-66%) Fat Layer Exposed: No [name], [name] H. ([id number]) Necrotic Quality: Adherent Slough Tendon Exposed: No Muscle Exposed: No Joint Exposed: No Bone Exposed: No Limited to Skin Breakdown Periwound Skin Texture Texture Color No Abnormalities Noted: No No Abnormalities Noted: No Callus: No Atrophie Blanche: No Crepitus: No Cyanosis: No Excoriation: Yes Ecchymosis: No Fluctuance: No Erythema: Yes Friable: No Hemosiderin Staining: No Induration: No Mottled: No Localized Edema: No Pallor: No Rash: No Rubor: No Scarring: No Moisture No Abnormalities Noted: No Dry / Scaly: No Maceration: No Moist: Yes Wound Preparation Ulcer Cleansing: Rinsed/Irrigated with Saline Topical Anesthetic Applied: Other: lidocaine 4%, Treatment Notes Wound #4 (Left, Proximal, Lateral Lower Leg) 1. Cleansed with: Clean wound with Normal Saline Cleanse wound with antibacterial soap and water 2. Anesthetic Topical Lidocaine 4% cream to wound bed prior to debridement 4. Dressing Applied: Iodosorb Ointment 5. Secondary Dressing Applied ABD Pad Dry Gauze 7. Secured with Tape 2 Layer Compression System - Bilateral Electronic Signature(s) Signed: 02/07/2016 1:21:57 PM By: Lady Gary, [name] ([id number]) Entered By: Alric Quan on 02/06/2016 14:48:19 [name] ([id number]) -------------------------------------------------------------------------------- Wound Assessment Details Patient Name: [name] Date of Service: 02/06/2016 2:15 PM Medical Record Number: [id number] Patient Account Number: [id number] Date of Birth/Sex: 1950/05/24 (65 y.o. Female) Treating RN: Ahmed Prima Primary Care Physician: LADA, [place] Other Clinician: Referring Physician: LADA, MELINDA Treating Physician/Extender: STONE III,  HOYT Weeks in Treatment: 11 Wound Status Wound Number: 5 Primary Venous Leg  Ulcer Etiology: Wound Location: Left Lower Leg - Lateral, Distal Wound Open Wounding Event: Gradually Appeared Status: Date Acquired: 12/21/2015 Comorbid Cataracts, Chronic Obstructive Weeks Of Treatment: 6 History: Pulmonary Disease (COPD), Sleep Clustered Wound: No Apnea, Hypertension, Osteoarthritis, Neuropathy Photos Photo Uploaded By: Alric Quan on 02/07/2016 07:55:22 Wound Measurements Length: (cm) 1.7 Width: (cm) 1.6 Depth: (cm) 0.2 Area: (cm) 2.136 Volume: (cm) 0.427 % Reduction in Area: -288.4% % Reduction in Volume: -676.4% Epithelialization: None Tunneling: No Undermining: No Wound Description Classification: Partial Thickness Wound Margin: Flat and Intact Exudate Amount: Large Exudate Type: Serous Exudate Color: amber Wound Bed Granulation Amount: None Present (0%) Exposed Structure Necrotic Amount: Large (67-100%) Fascia Exposed: No Necrotic Quality: Adherent Slough Fat Layer Exposed: No [name], [name] H. ([id number]) Tendon Exposed: No Muscle Exposed: No Joint Exposed: No Bone Exposed: No Limited to Skin Breakdown Periwound Skin Texture Texture Color No Abnormalities Noted: No No Abnormalities Noted: No Callus: No Atrophie Blanche: No Crepitus: No Cyanosis: No Excoriation: No Ecchymosis: No Fluctuance: No Erythema: No Friable: No Hemosiderin Staining: No Induration: No Mottled: No Localized Edema: No Pallor: No Rash: No Rubor: No Scarring: No Moisture No Abnormalities Noted: No Dry / Scaly: No Maceration: No Moist: Yes Wound Preparation Ulcer Cleansing: Rinsed/Irrigated with Saline Topical Anesthetic Applied: Other: lidocaine 4%, Treatment Notes Wound #5 (Left, Distal, Lateral Lower Leg) 1. Cleansed with: Clean wound with Normal Saline Cleanse wound with antibacterial soap and water 2. Anesthetic Topical Lidocaine 4% cream to wound bed prior to debridement 4. Dressing Applied: Iodosorb Ointment 5. Secondary  Dressing Applied ABD Pad Dry Gauze 7. Secured with Tape 2 Layer Compression System - Bilateral Electronic Signature(s) Signed: 02/07/2016 1:21:57 PM By: Lady Gary, [name] ([id number]) Entered By: Alric Quan on 02/06/2016 14:50:20 [name] ([id number]) -------------------------------------------------------------------------------- Wound Assessment Details Patient Name: [name] Date of Service: 02/06/2016 2:15 PM Medical Record Number: [id number] Patient Account Number: [id number] Date of Birth/Sex: 1950-06-06 (65 y.o. Female) Treating RN: Carolyne Fiscal, Debi Primary Care Physician: LADA, [place] Other Clinician: Referring Physician: LADA, MELINDA Treating Physician/Extender: STONE III, HOYT Weeks in Treatment: 11 Wound Status Wound Number: 6 Primary Venous Leg Ulcer Etiology: Wound Location: Left Lower Leg - Anterior Wound Open Wounding Event: Gradually Appeared Status: Date Acquired: 12/21/2015 Comorbid Cataracts, Chronic Obstructive Weeks Of Treatment: 6 History: Pulmonary Disease (COPD), Sleep Clustered Wound: No Apnea, Hypertension, Osteoarthritis, Neuropathy Photos Photo Uploaded By: Alric Quan on 02/07/2016 07:55:39 Wound Measurements Length: (cm) 1.4 Width: (cm) 1.2 Depth: (cm) 0.1 Area: (cm) 1.319 Volume: (cm) 0.132 % Reduction in Area: -199.8% % Reduction in Volume: -200% Epithelialization: None Tunneling: No Undermining: No Wound Description Classification: Partial Thickness Wound Margin: Flat and Intact Exudate Amount: Large Exudate Type: Serous Exudate Color: amber Wound Bed Granulation Amount: None Present (0%) Exposed Structure Necrotic Amount: Large (67-100%) Fascia Exposed: No Necrotic Quality: Adherent Slough Fat Layer Exposed: No [name], [name] H. ([id number]) Tendon Exposed: No Muscle Exposed: No Joint Exposed: No Bone Exposed: No Limited to Skin Breakdown Periwound Skin Texture Texture Color No  Abnormalities Noted: No No Abnormalities Noted: No Callus: No Atrophie Blanche: No Crepitus: No Cyanosis: No Excoriation: No Ecchymosis: No Fluctuance: No Erythema: No Friable: No Hemosiderin Staining: No Induration: No Mottled: No Localized Edema: No Pallor: No Rash: No Rubor: No Scarring: No Moisture No Abnormalities Noted: No Dry / Scaly: No Maceration: No Moist: No Wound Preparation Ulcer Cleansing: Rinsed/Irrigated with Saline Topical Anesthetic Applied: Other:  lidocaine 4%, Treatment Notes Wound #6 (Left, Anterior Lower Leg) 1. Cleansed with: Clean wound with Normal Saline Cleanse wound with antibacterial soap and water 2. Anesthetic Topical Lidocaine 4% cream to wound bed prior to debridement 4. Dressing Applied: Iodosorb Ointment 5. Secondary Dressing Applied ABD Pad Dry Gauze 7. Secured with Tape 2 Layer Compression System - Bilateral Electronic Signature(s) Signed: 02/07/2016 1:21:57 PM By: Lady Gary, [name] ([id number]) Entered By: Alric Quan on 02/06/2016 14:51:56 [name] ([id number]) -------------------------------------------------------------------------------- Wound Assessment Details Patient Name: [name] Date of Service: 02/06/2016 2:15 PM Medical Record Number: [id number] Patient Account Number: [id number] Date of Birth/Sex: [birth year] (65 y.o. Female) Treating RN: Carolyne Fiscal, Debi Primary Care Physician: LADA, [place] Other Clinician: Referring Physician: LADA, MELINDA Treating Physician/Extender: STONE III, HOYT Weeks in Treatment: 11 Wound Status Wound Number: 7 Primary To be determined Etiology: Wound Location: Left Lower Leg - Medial Wound Open Wounding Event: Gradually Appeared Status: Date Acquired: 02/06/2016 Comorbid Cataracts, Chronic Obstructive Weeks Of Treatment: 0 History: Pulmonary Disease (COPD), Sleep Clustered Wound: No Apnea, Hypertension,  Osteoarthritis, Neuropathy Photos Photo Uploaded By: Alric Quan on 02/07/2016 07:55:54 Wound Measurements Length: (cm) 1 Width: (cm) 2.1 Depth: (cm) 0.1 Area: (cm) 1.649 Volume: (cm) 0.165 % Reduction in Area: % Reduction in Volume: Epithelialization: None Tunneling: No Undermining: No Wound Description Classification: Partial Thickness Wound Margin: Flat and Intact Exudate Amount: Large Exudate Type: Serous Exudate Color: amber Foul Odor After Cleansing: No Wound Bed Granulation Amount: Medium (34-66%) Exposed Structure Granulation Quality: Pink Fascia Exposed: No Necrotic Amount: Medium (34-66%) Fat Layer Exposed: No [name], [name] H. ([id number]) Necrotic Quality: Adherent Slough Tendon Exposed: No Muscle Exposed: No Joint Exposed: No Bone Exposed: No Limited to Skin Breakdown Periwound Skin Texture Texture Color No Abnormalities Noted: No No Abnormalities Noted: No Erythema: Yes Moisture Erythema Location: Circumferential No Abnormalities Noted: No Moist: Yes Temperature / Pain Temperature: No Abnormality Tenderness on Palpation: Yes Wound Preparation Ulcer Cleansing: Rinsed/Irrigated with Saline Topical Anesthetic Applied: Other: lidocaine 4%, Treatment Notes Wound #7 (Left, Medial Lower Leg) 1. Cleansed with: Clean wound with Normal Saline Cleanse wound with antibacterial soap and water 2. Anesthetic Topical Lidocaine 4% cream to wound bed prior to debridement 4. Dressing Applied: Iodosorb Ointment 5. Secondary Dressing Applied ABD Pad Dry Gauze 7. Secured with Tape 2 Layer Compression System - Bilateral Electronic Signature(s) Signed: 02/07/2016 1:21:57 PM By: Alric Quan Entered By: Alric Quan on 02/06/2016 14:56:12 [name] ([id number]) -------------------------------------------------------------------------------- Vitals Details Patient Name: [name] Date of Service: 02/06/2016 2:15 PM Medical Record  Number: [id number] Patient Account Number: [id number] Date of Birth/Sex: 06-Apr-1951 (65 y.o. Female) Treating RN: Carolyne Fiscal, Debi Primary Care Physician: LADA, [place] Other Clinician: Referring Physician: LADA, [place] Treating Physician/Extender: STONE III, HOYT Weeks in Treatment: 11 Vital Signs Time Taken: 14:31 Temperature (F): 98.2 Height (in): 63 Pulse (bpm): 65 Weight (lbs): 270 Respiratory Rate (breaths/min): 20 Body Mass Index (BMI): 47.8 Blood Pressure (mmHg): 125/64 Reference Range: 80 - 120 mg / dl Electronic Signature(s) Signed: 02/07/2016 1:21:57 PM By: Alric Quan Entered By: Alric Quan on 02/06/2016 14:33:54

## 2016-02-07 NOTE — Progress Notes (Signed)
[name], [name] ([id number]) Visit Report for 02/06/2016 Chief Complaint Document Details Patient Name: [name], [name] Date of Service: 02/06/2016 2:15 PM Medical Record Number: [id number] Patient Account Number: [id number] Date of Birth/Sex: [birth year] (65 y.o. Female) Treating RN: Carolyne Fiscal, Debi Primary Care Physician: LADA, [place] Other Clinician: Referring Physician: LADA, [place] Treating Physician/Extender: STONE III, Yarianna Varble Weeks in Treatment: 11 Information Obtained from: Patient Chief Complaint Patient seen for complaints of Non-Healing Wound to the left and right lower extremity and the right dorsum of the foot Electronic Signature(s) Signed: 02/07/2016 9:39:47 AM By: Worthy Keeler PA-C Entered By: Worthy Keeler on 02/06/2016 17:08:13 [name] ([id number]) -------------------------------------------------------------------------------- Debridement Details Patient Name: [name] Date of Service: 02/06/2016 2:15 PM Medical Record Number: [id number] Patient Account Number: [id number] Date of Birth/Sex: 04/18/1951 (65 y.o. Female) Treating RN: Carolyne Fiscal, Debi Primary Care Physician: LADA, [place] Other Clinician: Referring Physician: LADA, [place] Treating Physician/Extender: STONE III, Evett Kassa Weeks in Treatment: 11 Debridement Performed for Wound #4 Left,Proximal,Lateral Lower Leg Assessment: Performed By: Physician STONE III, Takina Busser, Debridement: Debridement Pre-procedure Yes - 15:13 Verification/Time Out Taken: Start Time: 15:14 Pain Control: Lidocaine 4% Topical Solution Level: Skin/Subcutaneous Tissue Total Area Debrided (L x 6.1 (cm) x 3 (cm) = 18.3 (cm) W): Tissue and other Viable, Non-Viable, Fibrin/Slough, Subcutaneous material debrided: Instrument: Curette Bleeding: Minimum Hemostasis Achieved: Pressure End Time: 15:15 Procedural Pain: 6 Post Procedural Pain: 6 Post Debridement Measurements of Total Wound Length: (cm) 6.1 Width: (cm)  3 Depth: (cm) 0.1 Volume: (cm) 1.437 Character of Wound/Ulcer Post Requires Further Debridement Debridement: Severity of Tissue Post Debridement: Fat layer exposed Post Procedure Diagnosis Same as Pre-procedure Electronic Signature(s) Signed: 02/07/2016 9:39:47 AM By: Worthy Keeler PA-C Signed: 02/07/2016 1:21:57 PM By: Alric Quan Entered By: Alric Quan on 02/06/2016 15:15:47 [name] ([id number]) -------------------------------------------------------------------------------- Debridement Details Patient Name: [name] Date of Service: 02/06/2016 2:15 PM Medical Record Number: [id number] Patient Account Number: [id number] Date of Birth/Sex: [birth year] (64 y.o. Female) Treating RN: Carolyne Fiscal, Debi Primary Care Physician: LADA, [place] Other Clinician: Referring Physician: LADA, [place] Treating Physician/Extender: STONE III, Dyrell Tuccillo Weeks in Treatment: 11 Debridement Performed for Wound #5 Left,Distal,Lateral Lower Leg Assessment: Performed By: Physician STONE III, Dmoni Fortson, Debridement: Debridement Pre-procedure Yes - 15:13 Verification/Time Out Taken: Start Time: 15:16 Pain Control: Lidocaine 4% Topical Solution Level: Skin/Subcutaneous Tissue Total Area Debrided (L x 1.7 (cm) x 1.6 (cm) = 2.72 (cm) W): Tissue and other Viable, Non-Viable, Fibrin/Slough, Subcutaneous material debrided: Instrument: Curette Bleeding: Minimum Hemostasis Achieved: Pressure End Time: 15:20 Procedural Pain: 6 Post Procedural Pain: 6 Post Debridement Measurements of Total Wound Length: (cm) 1.7 Width: (cm) 1.6 Depth: (cm) 0.3 Volume: (cm) 0.641 Character of Wound/Ulcer Post Requires Further Debridement Debridement: Severity of Tissue Post Debridement: Fat layer exposed Post Procedure Diagnosis Same as Pre-procedure Electronic Signature(s) Signed: 02/07/2016 9:39:47 AM By: Worthy Keeler PA-C Signed: 02/07/2016 1:21:57 PM By: Alric Quan Entered By:  Alric Quan on 02/06/2016 15:16:54 [name] ([id number]) -------------------------------------------------------------------------------- Debridement Details Patient Name: [name] Date of Service: 02/06/2016 2:15 PM Medical Record Number: [id number] Patient Account Number: [id number] Date of Birth/Sex: [birth year] (65 y.o. Female) Treating RN: Carolyne Fiscal, Debi Primary Care Physician: LADA, [place] Other Clinician: Referring Physician: LADA, MELINDA Treating Physician/Extender: STONE III, Lenny Fiumara Weeks in Treatment: 11 Debridement Performed for Wound #7 Left,Medial Lower Leg Assessment: Performed By: Physician STONE III, Paeton Studer, Debridement: Debridement Pre-procedure Yes - 15:13 Verification/Time Out Taken: Start Time: 15:20 Pain Control: Lidocaine 4% Topical Solution Level: Skin/Subcutaneous Tissue Total  Area Debrided (L x 1 (cm) x 2.1 (cm) = 2.1 (cm) W): Tissue and other Viable, Non-Viable, Fibrin/Slough, Subcutaneous material debrided: Instrument: Curette Bleeding: Minimum Hemostasis Achieved: Pressure End Time: 15:21 Procedural Pain: 6 Post Procedural Pain: 6 Post Debridement Measurements of Total Wound Length: (cm) 1 Width: (cm) 2.1 Depth: (cm) 0.1 Volume: (cm) 0.165 Character of Wound/Ulcer Post Requires Further Debridement Debridement: Severity of Tissue Post Debridement: Fat layer exposed Post Procedure Diagnosis Same as Pre-procedure Electronic Signature(s) Signed: 02/07/2016 9:39:47 AM By: Worthy Keeler PA-C Signed: 02/07/2016 1:21:57 PM By: Alric Quan Entered By: Alric Quan on 02/06/2016 15:19:33 [name] ([id number]) -------------------------------------------------------------------------------- Debridement Details Patient Name: [name] Date of Service: 02/06/2016 2:15 PM Medical Record Number: [id number] Patient Account Number: [id number] Date of Birth/Sex: 1950/10/15 (65 y.o. Female) Treating RN: Carolyne Fiscal,  Debi Primary Care Physician: LADA, [place] Other Clinician: Referring Physician: LADA, [place] Treating Physician/Extender: STONE III, Marra Fraga Weeks in Treatment: 11 Debridement Performed for Wound #3 Right Lower Leg Assessment: Performed By: Physician STONE III, Tonika Eden, Debridement: Debridement Pre-procedure Yes - 15:13 Verification/Time Out Taken: Start Time: 15:23 Pain Control: Lidocaine 4% Topical Solution Level: Skin/Subcutaneous Tissue Total Area Debrided (L x 2 (cm) x 1.7 (cm) = 3.4 (cm) W): Tissue and other Viable, Non-Viable, Fibrin/Slough, Subcutaneous material debrided: Instrument: Curette Bleeding: Minimum Hemostasis Achieved: Pressure End Time: 15:25 Procedural Pain: 6 Post Procedural Pain: 6 Post Debridement Measurements of Total Wound Length: (cm) 2 Width: (cm) 1.7 Depth: (cm) 0.3 Volume: (cm) 0.801 Character of Wound/Ulcer Post Requires Further Debridement Debridement: Severity of Tissue Post Debridement: Fat layer exposed Post Procedure Diagnosis Same as Pre-procedure Electronic Signature(s) Signed: 02/07/2016 9:39:47 AM By: Worthy Keeler PA-C Signed: 02/07/2016 1:21:57 PM By: Alric Quan Entered By: Alric Quan on 02/06/2016 15:21:04 [name] ([id number]) -------------------------------------------------------------------------------- Debridement Details Patient Name: [name] Date of Service: 02/06/2016 2:15 PM Medical Record Number: [id number] Patient Account Number: [id number] Date of Birth/Sex: 05/05/1950 (65 y.o. Female) Treating RN: Carolyne Fiscal, Debi Primary Care Physician: LADA, [place] Other Clinician: Referring Physician: LADA, [place] Treating Physician/Extender: STONE III, Tekia [place] Weeks in Treatment: 11 Debridement Performed for Wound #2 Right,Dorsal Foot Assessment: Performed By: Physician STONE III, Brinna Divelbiss, Debridement: Debridement Pre-procedure Yes - 15:13 Verification/Time Out Taken: Start Time: 15:22 Pain  Control: Lidocaine 4% Topical Solution Level: Skin/Subcutaneous Tissue Total Area Debrided (L x 0.3 (cm) x 0.5 (cm) = 0.15 (cm) W): Tissue and other Viable, Non-Viable, Fibrin/Slough, Subcutaneous material debrided: Instrument: Curette Bleeding: Minimum Hemostasis Achieved: Pressure End Time: 15:23 Procedural Pain: 6 Post Procedural Pain: 6 Post Debridement Measurements of Total Wound Length: (cm) 0.3 Width: (cm) 0.5 Depth: (cm) 0.2 Volume: (cm) 0.024 Character of Wound/Ulcer Post Requires Further Debridement Debridement: Severity of Tissue Post Debridement: Fat layer exposed Post Procedure Diagnosis Same as Pre-procedure Electronic Signature(s) Signed: 02/07/2016 9:39:47 AM By: Worthy Keeler PA-C Signed: 02/07/2016 1:21:57 PM By: Alric Quan Entered By: Alric Quan on 02/06/2016 15:21:18 [name] ([id number]) -------------------------------------------------------------------------------- HPI Details Patient Name: [name] Date of Service: 02/06/2016 2:15 PM Medical Record Number: [id number] Patient Account Number: [id number] Date of Birth/Sex: 1950-07-30 (65 y.o. Female) Treating RN: Carolyne Fiscal, Debi Primary Care Physician: LADA, [place] Other Clinician: Referring Physician: LADA, MELINDA Treating Physician/Extender: STONE III, Beatric Fulop Weeks in Treatment: 11 History of Present Illness Location: left and right lower extremity and dorsum of the right foot Quality: Patient reports experiencing a sharp pain to affected area(s). Severity: Patient rates the pain to be a 8 out of 10 especially with palpation Duration:  Patient has had the wound for > 6 months prior to seeking treatment at the wound center Timing: Pain in wound is Intermittent in severity but persistent Context: The wound appeared gradually over time Modifying Factors: Other treatment(s) tried include: symptomatic treatment as advised by her PCP Associated Signs and Symptoms: Patient  reports having increase swelling iin her bilateral lower extremities HPI Description: 65 year old with a past medical history significant for MS, urinary incontinence, and obesity. She has been seen in the wound clinic before for lower extremity ulcerations treated with compression therapy. she is also known to have hypertension, peripheral vascular disease, COPD, obstructive sleep apnea, lumbar radiculopathy, kyphoscoliosis, urinary issues and tobacco abuse. Smokes a packet of cigarettes a day was recently seen at the [facility] for swelling of her legs and feet with a ulceration on the dorsum of the right foot which has been there for about 6 months. she was recently in the ER about a month ago where she was seen for shortness of breath and swelling of the legs and a chest x-ray was within normal limits had an increase in her BNP and was given Lasix and put on doxycycline for a mild cellulitis and possible UTI. Wounds aresmaller today 11/13/15. Debrided and will continue Santyl. 12/21/2015 -- she was admitted to the hospital overnight on 12/18/2015 and diagnosed with urinary retention and cellulitis of the left lower leg. is past to take clindamycin and use Santyl for her wounds. 01/15/2016 -- come back to see [place] for almost a month and continues to be noncompliant with her dressings 01/30/16 patient presents today for a follow-up visit concerning her ongoing bilateral lower extremity wounds. We have not seen her for the past 2 weeks although we are supposed to be seen her for weekly visits. She currently has a Foley catheter. She tells me that the wounds are intensely painful especially with pressure and palpation at this point in time. Fortunately she is having no interval signs or symptoms of systemic infection but unfortunately the wounds have not improved dramatically since we last saw her. She does have home health coming to take care of her as well. She is currently not in  any compression wraps. 02/06/16 ON evaluation today patient continues to experience discomfort regarding her bilateral lower extremity ulcers. She has continued to tolerate the dressing changes at this point in time and continues to have a Foley catheter as well. Fortunately she is back this week in the past she has been somewhat noncompliant with follow-ups I'm glad to see her today. She tells me that her pain level varies but can be as high as a 7 out of 10 with manipulation of the wound. She tells me that she used to be on oxycodone which was managed by the pain clinic although she is no longer on that as she tells me that she was actually smoking marijuana at the time and when this was found out they discontinued her pain medication. She no [name], [name] H. ([id number]) longer is taking anything pain medication wise and she also does not smoke cigarettes nor marijuana at this point. Electronic Signature(s) Signed: 02/07/2016 9:39:47 AM By: Worthy Keeler PA-C Entered By: Worthy Keeler on 02/06/2016 17:10:00 [name] ([id number]) -------------------------------------------------------------------------------- Physical Exam Details Patient Name: [name] Date of Service: 02/06/2016 2:15 PM Medical Record Number: [id number] Patient Account Number: [id number] Date of Birth/Sex: Apr 16, 1951 (65 y.o. Female) Treating RN: Ahmed Prima Primary Care Physician: LADA, [place] Other Clinician: Referring Physician:  LADA, MELINDA Treating Physician/Extender: STONE III, Zohal Reny Weeks in Treatment: 59 Constitutional Well-nourished and well-hydrated in no acute distress. Respiratory normal breathing without difficulty. Cardiovascular 1+ dorsalis pedis/posterior tibialis pulses. bilateral 1+ pitting edema noted. Psychiatric this patient is able to make decisions and demonstrates good insight into disease process. Alert and Oriented x 3. pleasant and cooperative. Electronic  Signature(s) Signed: 02/07/2016 9:39:47 AM By: Worthy Keeler PA-C Entered By: Worthy Keeler on 02/06/2016 17:10:43 [name] ([id number]) -------------------------------------------------------------------------------- Physician Orders Details Patient Name: [name] Date of Service: 02/06/2016 2:15 PM Medical Record Number: [id number] Patient Account Number: [id number] Date of Birth/Sex: 1950/09/01 (65 y.o. Female) Treating RN: Carolyne Fiscal, Debi Primary Care Physician: LADA, [place] Other Clinician: Referring Physician: LADA, MELINDA Treating Physician/Extender: STONE III, Elonzo Sopp Weeks in Treatment: 55 Verbal / Phone Orders: No Diagnosis Coding Wound Cleansing Wound #2 Right,Dorsal Foot o May Shower, gently pat wound dry prior to applying new dressing. - between dressing changes Wound #3 Right Lower Leg o May Shower, gently pat wound dry prior to applying new dressing. - between dressing changes Wound #7 Left,Medial Lower Leg o May Shower, gently pat wound dry prior to applying new dressing. - between dressing changes Wound #4 Left,Proximal,Lateral Lower Leg o May Shower, gently pat wound dry prior to applying new dressing. - between dressing changes Wound #5 Left,Distal,Lateral Lower Leg o May Shower, gently pat wound dry prior to applying new dressing. - between dressing changes Wound #6 Left,Anterior Lower Leg o May Shower, gently pat wound dry prior to applying new dressing. - between dressing changes Anesthetic Wound #2 Right,Dorsal Foot o Topical Lidocaine 4% cream applied to wound bed prior to debridement - in clinic only Wound #3 Right Lower Leg o Topical Lidocaine 4% cream applied to wound bed prior to debridement - in clinic only Wound #7 Left,Medial Lower Leg o Topical Lidocaine 4% cream applied to wound bed prior to debridement - in clinic only Wound #4 Left,Proximal,Lateral Lower Leg o Topical Lidocaine 4% cream applied to wound bed  prior to debridement - in clinic only Wound #5 Left,Distal,Lateral Lower Leg o Topical Lidocaine 4% cream applied to wound bed prior to debridement - in clinic only Wound #6 Left,Anterior Lower Leg o Topical Lidocaine 4% cream applied to wound bed prior to debridement - in clinic only Risk, [place] H. ([id number]) Primary Wound Dressing Wound #2 Right,Dorsal Foot o Iodosorb Ointment Wound #7 Left,Medial Lower Leg o Iodosorb Ointment Wound #3 Right Lower Leg o Iodosorb Ointment Wound #4 Left,Proximal,Lateral Lower Leg o Iodosorb Ointment Wound #5 Left,Distal,Lateral Lower Leg o Iodosorb Ointment Wound #6 Left,Anterior Lower Leg o Iodosorb Ointment Secondary Dressing Wound #2 Right,Dorsal Foot o ABD pad o Dry Gauze Wound #7 Left,Medial Lower Leg o ABD pad o Dry Gauze Wound #3 Right Lower Leg o ABD pad o Dry Gauze Wound #4 Left,Proximal,Lateral Lower Leg o ABD pad o Dry Gauze Wound #5 Left,Distal,Lateral Lower Leg o ABD pad o Dry Gauze Wound #6 Left,Anterior Lower Leg o ABD pad o Dry Gauze Dressing Change Frequency Wound #2 Right,Dorsal Foot o Three times weekly Almendarez, Eran H. ([id number]) Wound #3 Right Lower Leg o Three times weekly Wound #7 Left,Medial Lower Leg o Three times weekly Wound #4 Left,Proximal,Lateral Lower Leg o Three times weekly Wound #5 Left,Distal,Lateral Lower Leg o Three times weekly Wound #6 Left,Anterior Lower Leg o Three times weekly Follow-up Appointments Wound #2 Right,Dorsal Foot o Return Appointment in 1 week. Wound #7 Left,Medial Lower Leg o Return Appointment in 1  week. Wound #3 Right Lower Leg o Return Appointment in 1 week. Wound #4 Left,Proximal,Lateral Lower Leg o Return Appointment in 1 week. Wound #5 Left,Distal,Lateral Lower Leg o Return Appointment in 1 week. Wound #6 Left,Anterior Lower Leg o Return Appointment in 1 week. Edema Control Wound #2  Right,Dorsal Foot o 2 Layer Lite Compression System - Bilateral o Patient to wear own compression stockings - HHRN please order compression hose for patient 20-56mmHg o Elevate legs to the level of the heart and pump ankles as often as possible Wound #7 Left,Medial Lower Leg o 2 Layer Lite Compression System - Bilateral o Patient to wear own compression stockings - HHRN please order compression hose for patient 20-25mmHg o Elevate legs to the level of the heart and pump ankles as often as possible Wound #3 Right Lower Leg [name], [name] H. ([id number]) o 2 Layer Lite Compression System - Bilateral o Patient to wear own compression stockings - HHRN please order compression hose for patient 20-43mmHg o Elevate legs to the level of the heart and pump ankles as often as possible Wound #4 Left,Proximal,Lateral Lower Leg o 2 Layer Lite Compression System - Bilateral o Patient to wear own compression stockings - HHRN please order compression hose for patient 20-59mmHg o Elevate legs to the level of the heart and pump ankles as often as possible Wound #5 Left,Distal,Lateral Lower Leg o 2 Layer Lite Compression System - Bilateral o Patient to wear own compression stockings - HHRN please order compression hose for patient 20-31mmHg o Elevate legs to the level of the heart and pump ankles as often as possible Wound #6 Left,Anterior Lower Leg o 2 Layer Lite Compression System - Bilateral o Patient to wear own compression stockings - HHRN please order compression hose for patient 20-42mmHg o Elevate legs to the level of the heart and pump ankles as often as possible Additional Orders / Instructions Wound #2 Right,Dorsal Foot o Stop Smoking o Increase protein intake. Wound #3 Right Lower Leg o Stop Smoking o Increase protein intake. Wound #7 Left,Medial Lower Leg o Stop Smoking o Increase protein intake. Wound #4 Left,Proximal,Lateral Lower  Leg o Stop Smoking o Increase protein intake. Wound #5 Left,Distal,Lateral Lower Leg o Stop Smoking o Increase protein intake. Wound #6 Left,Anterior Lower Leg o Stop Smoking o Increase protein intake. [name], [name] ([id number]) Home Health Wound #2 [place] Visits - Advanced ********[facility] please order compression hose for patient 20-81mmHg********* o Home Health Nurse may visit PRN to address patientos wound care needs. o FACE TO FACE ENCOUNTER: MEDICARE and MEDICAID PATIENTS: I certify that this patient is under my care and that I had a face-to-face encounter that meets the physician face-to-face encounter requirements with this patient on this date. The encounter with the patient was in whole or in part for the following MEDICAL CONDITION: (primary reason for [place]) MEDICAL NECESSITY: I certify, that based on my findings, NURSING services are a medically necessary home health service. HOME BOUND STATUS: I certify that my clinical findings support that this patient is homebound (i.e., Due to illness or injury, pt requires aid of supportive devices such as crutches, cane, wheelchairs, walkers, the use of special transportation or the assistance of another person to leave their place of residence. There is a normal inability to leave the home and doing so requires considerable and taxing effort. Other absences are for medical reasons / religious services and are infrequent or of short duration when for other reasons). o  If current dressing causes regression in wound condition, may D/C ordered dressing product/s and apply Normal Saline Moist Dressing daily until next [place] / Other MD appointment. [place] of regression in wound condition at [phone]. o Please direct any NON-WOUND related issues/requests for orders to patient's Primary Care Physician Wound #7 Left,Medial Lower Leg o  [place] Visits - Advanced ********[facility] please order compression hose for patient 20-58mmHg********* o Home Health Nurse may visit PRN to address patientos wound care needs. o FACE TO FACE ENCOUNTER: MEDICARE and MEDICAID PATIENTS: I certify that this patient is under my care and that I had a face-to-face encounter that meets the physician face-to-face encounter requirements with this patient on this date. The encounter with the patient was in whole or in part for the following MEDICAL CONDITION: (primary reason for [place]) MEDICAL NECESSITY: I certify, that based on my findings, NURSING services are a medically necessary home health service. HOME BOUND STATUS: I certify that my clinical findings support that this patient is homebound (i.e., Due to illness or injury, pt requires aid of supportive devices such as crutches, cane, wheelchairs, walkers, the use of special transportation or the assistance of another person to leave their place of residence. There is a normal inability to leave the home and doing so requires considerable and taxing effort. Other absences are for medical reasons / religious services and are infrequent or of short duration when for other reasons). o If current dressing causes regression in wound condition, may D/C ordered dressing product/s and apply Normal Saline Moist Dressing daily until next [place] / Other MD appointment. [place] of regression in wound condition at [phone]. o Please direct any NON-WOUND related issues/requests for orders to patient's Primary Care Physician Wound #3 Right Lower Leg o [place] Visits - Advanced *********[facility] please order compression hose for patient 20-58mmHg********* o Home Health Nurse may visit PRN to address patientos wound care needs. [name], [name] ([id number]) o FACE TO FACE ENCOUNTER: MEDICARE and MEDICAID PATIENTS: I certify that this  patient is under my care and that I had a face-to-face encounter that meets the physician face-to-face encounter requirements with this patient on this date. The encounter with the patient was in whole or in part for the following MEDICAL CONDITION: (primary reason for [place]) MEDICAL NECESSITY: I certify, that based on my findings, NURSING services are a medically necessary home health service. HOME BOUND STATUS: I certify that my clinical findings support that this patient is homebound (i.e., Due to illness or injury, pt requires aid of supportive devices such as crutches, cane, wheelchairs, walkers, the use of special transportation or the assistance of another person to leave their place of residence. There is a normal inability to leave the home and doing so requires considerable and taxing effort. Other absences are for medical reasons / religious services and are infrequent or of short duration when for other reasons). o If current dressing causes regression in wound condition, may D/C ordered dressing product/s and apply Normal Saline Moist Dressing daily until next [place] / Other MD appointment. [place] of regression in wound condition at [phone]. o Please direct any NON-WOUND related issues/requests for orders to patient's Primary Care Physician Wound #4 Left,Proximal,Lateral Lower Leg o [place] Visits - Advanced *********[facility] please order compression hose for patient 20-64mmHg********* o Home Health Nurse may visit PRN to address patientos wound care needs. o FACE TO  FACE ENCOUNTER: MEDICARE and MEDICAID PATIENTS: I certify that this patient is under my care and that I had a face-to-face encounter that meets the physician face-to-face encounter requirements with this patient on this date. The encounter with the patient was in whole or in part for the following MEDICAL CONDITION: (primary reason for [place]) MEDICAL NECESSITY: I certify, that based on my findings, NURSING services are a medically necessary home health service. HOME BOUND STATUS: I certify that my clinical findings support that this patient is homebound (i.e., Due to illness or injury, pt requires aid of supportive devices such as crutches, cane, wheelchairs, walkers, the use of special transportation or the assistance of another person to leave their place of residence. There is a normal inability to leave the home and doing so requires considerable and taxing effort. Other absences are for medical reasons / religious services and are infrequent or of short duration when for other reasons). o If current dressing causes regression in wound condition, may D/C ordered dressing product/s and apply Normal Saline Moist Dressing daily until next [place] / Other MD appointment. [place] of regression in wound condition at [phone]. o Please direct any NON-WOUND related issues/requests for orders to patient's Primary Care Physician Wound #5 Left,Distal,Lateral Lower Leg o [place] Visits - Advanced *********[facility] please order compression hose for patient 20-25mmHg********* o Home Health Nurse may visit PRN to address patientos wound care needs. o FACE TO FACE ENCOUNTER: MEDICARE and MEDICAID PATIENTS: I certify that this patient is under my care and that I had a face-to-face encounter that meets the physician face-to-face encounter requirements with this patient on this date. The encounter with the patient was in whole or in part for the following MEDICAL CONDITION: (primary reason for [place]) MEDICAL NECESSITY: I certify, that based on my findings, NURSING services are a medically necessary home health service. HOME BOUND STATUS: I certify that my clinical findings [name], [name]. ([id number]) support that this patient is homebound (i.e., Due to illness or  injury, pt requires aid of supportive devices such as crutches, cane, wheelchairs, walkers, the use of special transportation or the assistance of another person to leave their place of residence. There is a normal inability to leave the home and doing so requires considerable and taxing effort. Other absences are for medical reasons / religious services and are infrequent or of short duration when for other reasons). o If current dressing causes regression in wound condition, may D/C ordered dressing product/s and apply Normal Saline Moist Dressing daily until next [place] / Other MD appointment. [place] of regression in wound condition at [phone]. o Please direct any NON-WOUND related issues/requests for orders to patient's Primary Care Physician Wound #6 [place] Visits - Advanced *********[facility] please order compression hose for patient 20-69mmHg********* o Home Health Nurse may visit PRN to address patientos wound care needs. o FACE TO FACE ENCOUNTER: MEDICARE and MEDICAID PATIENTS: I certify that this patient is under my care and that I had a face-to-face encounter that meets the physician face-to-face encounter requirements with this patient on this date. The encounter with the patient was in whole or in part for the following MEDICAL CONDITION: (primary reason for [place]) MEDICAL NECESSITY: I certify, that based on my findings, NURSING services are a medically necessary home health service. HOME BOUND STATUS: I certify that my clinical findings support that this patient is homebound (i.e.,  Due to illness or injury, pt requires aid of supportive devices such as crutches, cane, wheelchairs, walkers, the use of special transportation or the assistance of another person to leave their place of residence. There is a normal inability to leave the home and doing so requires considerable and taxing  effort. Other absences are for medical reasons / religious services and are infrequent or of short duration when for other reasons). o If current dressing causes regression in wound condition, may D/C ordered dressing product/s and apply Normal Saline Moist Dressing daily until next [place] / Other MD appointment. [place] of regression in wound condition at [phone]. o Please direct any NON-WOUND related issues/requests for orders to patient's Primary Care Physician Electronic Signature(s) Signed: 02/07/2016 9:39:47 AM By: Worthy Keeler PA-C Signed: 02/07/2016 1:21:57 PM By: Alric Quan Entered By: Alric Quan on 02/06/2016 15:26:45 [name] ([id number]) -------------------------------------------------------------------------------- Problem List Details Patient Name: [name] Date of Service: 02/06/2016 2:15 PM Medical Record Number: [id number] Patient Account Number: [id number] Date of Birth/Sex: 06-Oct-1950 (65 y.o. 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 Female) Treating RN: Carolyne Fiscal, Debi Primary Care Physician: LADA, Men[place] Other Clinician: Referring Physician: LADA, MELINDA Treating Physician/Extender: STONE III, Deward Sebek Weeks in Treatment: 11 Subjective Chief Complaint Information obtained from Patient Patient seen for complaints of Non-Healing Wound to the left and right lower extremity and the right dorsum of the foot History of Present Illness (HPI) The following HPI elements were documented for the patient's wound: Location: left and right lower extremity and dorsum of the right foot Quality: Patient reports experiencing a sharp pain to affected area(s). Severity: Patient rates the pain to be a 8 out of 10 especially with palpation Duration: Patient has had the wound for > 6 months prior to seeking treatment at the wound center Timing: Pain in wound is Intermittent in severity but persistent Context: The wound appeared gradually over time Modifying Factors: Other treatment(s) tried include: symptomatic treatment as advised by her PCP Associated Signs and Symptoms: Patient reports having increase swelling iin her bilateral lower extremities 65 year old with a past medical history significant for MS, urinary incontinence, and obesity. She has been seen in the wound clinic before for lower extremity ulcerations treated with compression therapy. she is also known to have hypertension, peripheral vascular disease, COPD, obstructive sleep apnea, lumbar radiculopathy, kyphoscoliosis, urinary issues and tobacco abuse. Smokes a packet of cigarettes a day was recently seen at the [facility] for swelling of her legs and feet with a  ulceration on the dorsum of the right foot which has been there  for about 6 months. she was recently in the ER about a month ago where she was seen for shortness of breath and swelling of the legs and a chest x-ray was within normal limits had an increase in her BNP and was given Lasix and put on doxycycline for a mild cellulitis and possible UTI. Wounds aresmaller today 11/13/15. Debrided and will continue Santyl. 12/21/2015 -- she was admitted to the hospital overnight on 12/18/2015 and diagnosed with urinary retention and cellulitis of the left lower leg. is past to take clindamycin and use Santyl for her wounds. 01/15/2016 -- come back to see [place] for almost a month and continues to be noncompliant with her dressings 01/30/16 patient presents today for a follow-up visit concerning her ongoing bilateral lower extremity wounds. We have not seen her for the past 2 weeks although we are supposed to be seen her for weekly visits. She currently has a Foley catheter. She tells me that the wounds are intensely painful especially with pressure and palpation at this point in time. Fortunately she is having no interval signs or symptoms of systemic infection but unfortunately the wounds have not improved dramatically since we last saw her. She does have home health coming to take care of her as well. She is currently not in any compression wraps. DANAEJAH, [name] ([id number]) 02/06/16 ON evaluation today patient continues to experience discomfort regarding her bilateral lower extremity ulcers. She has continued to tolerate the dressing changes at this point in time and continues to have a Foley catheter as well. Fortunately she is back this week in the past she has been somewhat noncompliant with follow-ups I'm glad to see her today. She tells me that her pain level varies but can be as high as a 7 out of 10 with manipulation of the wound. She tells me that she used to be on oxycodone which was managed by  the pain clinic although she is no longer on that as she tells me that she was actually smoking marijuana at the time and when this was found out they discontinued her pain medication. She no longer is taking anything pain medication wise and she also does not smoke cigarettes nor marijuana at this point. Objective Constitutional Well-nourished and well-hydrated in no acute distress. Vitals Time Taken: 2:31 PM, Height: 63 in, Weight: 270 lbs, BMI: 47.8, Temperature: 98.2 F, Pulse: 65 bpm, Respiratory Rate: 20 breaths/min, Blood Pressure: 125/64 mmHg. Respiratory normal breathing without difficulty. Cardiovascular 1+ dorsalis pedis/posterior tibialis pulses. bilateral 1+ pitting edema noted. Psychiatric this patient is able to make decisions and demonstrates good insight into disease process. Alert and Oriented x 3. pleasant and cooperative. Integumentary (Hair, Skin) Wound #2 status is Open. Original cause of wound was Trauma. The wound is located on the Right,Dorsal Foot. The wound measures 0.3cm length x 0.5cm width x 0.1cm depth; 0.118cm^2 area and 0.012cm^3 volume. The wound is limited to skin breakdown. There is no tunneling or undermining noted. There is a large amount of serous drainage noted. The wound margin is flat and intact. There is no granulation within the wound bed. There is a large (67-100%) amount of necrotic tissue within the wound bed including Adherent Slough. The periwound skin appearance exhibited: Localized Edema, Ecchymosis. The periwound skin appearance did not exhibit: Callus, Crepitus, Excoriation, Fluctuance, Friable, Induration, Rash, Scarring, Dry/Scaly, Maceration, Moist, Atrophie Blanche, Cyanosis, Hemosiderin Staining, Mottled, Pallor, Rubor, Erythema. Wound #3 status is Open. Original cause of wound was Trauma. The wound is located on  the Right Lower Leg. The wound measures 2cm length x 1.7cm width x 0.2cm depth; 2.67cm^2 area and 0.534cm^3 volume. The  wound is limited to skin breakdown. There is no tunneling or undermining noted. There is a [name], [name] H. ([id number]) large amount of serous drainage noted. The wound margin is flat and intact. There is no granulation within the wound bed. There is a large (67-100%) amount of necrotic tissue within the wound bed including Adherent Slough. The periwound skin appearance did not exhibit: Callus, Crepitus, Excoriation, Fluctuance, Friable, Induration, Localized Edema, Rash, Scarring, Dry/Scaly, Maceration, Moist, Atrophie Blanche, Cyanosis, Ecchymosis, Hemosiderin Staining, Mottled, Pallor, Rubor, Erythema. Wound #4 status is Open. Original cause of wound was Trauma. The wound is located on the Left,Proximal,Lateral Lower Leg. The wound measures 6.1cm length x 3cm width x 0.1cm depth; 14.373cm^2 area and 1.437cm^3 volume. The wound is limited to skin breakdown. There is no tunneling or undermining noted. There is a large amount of serous drainage noted. The wound margin is flat and intact. There is medium (34-66%) pale granulation within the wound bed. There is a medium (34-66%) amount of necrotic tissue within the wound bed including Adherent Slough. The periwound skin appearance exhibited: Excoriation, Moist, Erythema. The periwound skin appearance did not exhibit: Callus, Crepitus, Fluctuance, Friable, Induration, Localized Edema, Rash, Scarring, Dry/Scaly, Maceration, Atrophie Blanche, Cyanosis, Ecchymosis, Hemosiderin Staining, Mottled, Pallor, Rubor. The surrounding wound skin color is noted with erythema. Wound #5 status is Open. Original cause of wound was Gradually Appeared. The wound is located on the Left,Distal,Lateral Lower Leg. The wound measures 1.7cm length x 1.6cm width x 0.2cm depth; 2.136cm^2 area and 0.427cm^3 volume. The wound is limited to skin breakdown. There is no tunneling or undermining noted. There is a large amount of serous drainage noted. The wound margin is flat and  intact. There is no granulation within the wound bed. There is a large (67-100%) amount of necrotic tissue within the wound bed including Adherent Slough. The periwound skin appearance exhibited: Moist. The periwound skin appearance did not exhibit: Callus, Crepitus, Excoriation, Fluctuance, Friable, Induration, Localized Edema, Rash, Scarring, Dry/Scaly, Maceration, Atrophie Blanche, Cyanosis, Ecchymosis, Hemosiderin Staining, Mottled, Pallor, Rubor, Erythema. Wound #6 status is Open. Original cause of wound was Gradually Appeared. The wound is located on the Left,Anterior Lower Leg. The wound measures 1.4cm length x 1.2cm width x 0.1cm depth; 1.319cm^2 area and 0.132cm^3 volume. The wound is limited to skin breakdown. There is no tunneling or undermining noted. There is a large amount of serous drainage noted. The wound margin is flat and intact. There is no granulation within the wound bed. There is a large (67-100%) amount of necrotic tissue within the wound bed including Adherent Slough. The periwound skin appearance did not exhibit: Callus, Crepitus, Excoriation, Fluctuance, Friable, Induration, Localized Edema, Rash, Scarring, Dry/Scaly, Maceration, Moist, Atrophie Blanche, Cyanosis, Ecchymosis, Hemosiderin Staining, Mottled, Pallor, Rubor, Erythema. Wound #7 status is Open. Original cause of wound was Gradually Appeared. The wound is located on the Left,Medial Lower Leg. The wound measures 1cm length x 2.1cm width x 0.1cm depth; 1.649cm^2 area and 0.165cm^3 volume. The wound is limited to skin breakdown. There is no tunneling or undermining noted. There is a large amount of serous drainage noted. The wound margin is flat and intact. There is medium (34-66%) pink granulation within the wound bed. There is a medium (34-66%) amount of necrotic tissue within the wound bed including Adherent Slough. The periwound skin appearance exhibited: Moist, Erythema. The surrounding wound skin color is  noted with erythema which is circumferential. Periwound temperature was noted as No Abnormality. The periwound has tenderness on palpation. 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 There was a Skin/Subcutaneous Tissue Debridement [id number]) debridement with total area of 0.15 sq cm performed by STONE III, Jojuan Champney. with the following instrument(s): Curette to remove Viable and Non-Viable tissue/material including Fibrin/Slough and Subcutaneous after achieving pain control using Lidocaine 4% Topical Solution. A time out was conducted at 15:13, prior to the start of the procedure. A Minimum amount of bleeding was controlled with Pressure. The patient tolerated the procedure with a pain level of 6 throughout and a pain level of 6 following the procedure. Post Debridement Measurements: 0.3cm length x 0.5cm width x 0.2cm depth; 0.024cm^3 volume. Character of Wound/Ulcer Post Debridement requires further debridement. Severity of Tissue Post Debridement is: Fat layer exposed. Post procedure Diagnosis Wound #2: Same as Pre-Procedure Wound #3 Wound #3 is a Trauma, Other located on the Right Lower Leg . There was a Skin/Subcutaneous Tissue Debridement [id number]) debridement with total area of 3.4 sq cm performed by STONE III, Shamia Uppal. with the following instrument(s): Curette to remove Viable and Non-Viable tissue/material including Fibrin/Slough and Subcutaneous after achieving pain control using Lidocaine  4% Topical Solution. A time out was conducted at 15:13, prior to the start of the procedure. A Minimum amount of bleeding was controlled with Pressure. The patient tolerated the procedure with a pain level of 6 throughout and a pain level of 6 following the procedure. Post Debridement Measurements: 2cm length x 1.7cm width x 0.3cm depth; 0.801cm^3 volume. Character of Wound/Ulcer Post Debridement requires further debridement. Severity of Tissue Post Debridement is: Fat layer exposed. Post procedure Diagnosis Wound #3: Same as Pre-Procedure Wound #4 Wound #4 is a Venous Leg Ulcer located on the Left,Proximal,Lateral Lower Leg . There was a Skin/Subcutaneous Tissue Debridement [id number]) debridement with total area of 18.3 sq cm performed by STONE III, Sashay Felling. with the following instrument(s): Curette to remove Viable and Non-Viable tissue/material including Fibrin/Slough and Subcutaneous after achieving pain control using Lidocaine 4% [name], [name] H. ([id number]) Topical Solution. A time out was conducted at 15:13, prior to the start of the procedure. A Minimum amount of bleeding was controlled with Pressure. The patient tolerated the procedure with a pain level of 6 throughout and a pain level of 6 following the procedure. Post Debridement Measurements: 6.1cm length x 3cm width x 0.1cm depth; 1.437cm^3 volume. Character of Wound/Ulcer Post Debridement requires further debridement. Severity of Tissue Post Debridement is: Fat layer exposed. Post procedure Diagnosis Wound #4: Same as Pre-Procedure Wound #5 Wound #5 is a Venous Leg Ulcer located on the Left,Distal,Lateral Lower Leg . There was a Skin/Subcutaneous Tissue Debridement [id number]) debridement with total area of 2.72 sq cm performed by STONE III, Amelie Caracci. with the following instrument(s): Curette to remove Viable and Non-Viable tissue/material including Fibrin/Slough and Subcutaneous after achieving pain control using Lidocaine  4% Topical Solution. A time out was conducted at 15:13, prior to the start of the procedure. A Minimum amount of bleeding was controlled with  Pressure. The patient tolerated the procedure with a pain level of 6 throughout and a pain level of 6 following the procedure. Post Debridement Measurements: 1.7cm length x 1.6cm width x 0.3cm depth; 0.641cm^3 volume. Character of Wound/Ulcer Post Debridement requires further debridement. Severity of Tissue Post Debridement is: Fat layer exposed. Post procedure Diagnosis Wound #5: Same as Pre-Procedure Wound #7 Wound #7 is a To be determined located on the Left,Medial Lower Leg . There was a Skin/Subcutaneous Tissue Debridement [id number]) debridement with total area of 2.1 sq cm performed by STONE III, Shuntae Herzig. with the following instrument(s): Curette to remove Viable and Non-Viable tissue/material including Fibrin/Slough and Subcutaneous after achieving pain control using Lidocaine 4% Topical Solution. A time out was conducted at 15:13, prior to the start of the procedure. A Minimum amount of bleeding was controlled with Pressure. The patient tolerated the procedure with a pain level of 6 throughout and a pain level of 6 following the procedure. Post Debridement Measurements: 1cm length x 2.1cm width x 0.1cm depth; 0.165cm^3 volume. Character of Wound/Ulcer Post Debridement requires further debridement. Severity of Tissue Post Debridement is: Fat layer exposed. Post procedure Diagnosis Wound #7: Same as Pre-Procedure Plan Wound Cleansing: Wound #2 Right,Dorsal Foot: May Shower, gently pat wound dry prior to applying new dressing. - between dressing changes Wound #3 Right Lower Leg: May Shower, gently pat wound dry prior to applying new dressing. - between dressing changes Wound #7 Left,Medial Lower Leg: May Shower, gently pat wound dry prior to applying new dressing. - between dressing changes [name], Maelys H. ([id number]) Wound #4  Left,Proximal,Lateral Lower Leg: May Shower, gently pat wound dry prior to applying new dressing. - between dressing changes Wound #5 Left,Distal,Lateral Lower Leg: May Shower, gently pat wound dry prior to applying new dressing. - between dressing changes Wound #6 Left,Anterior Lower Leg: May Shower, gently pat wound dry prior to applying new dressing. - between dressing changes Anesthetic: Wound #2 Right,Dorsal Foot: Topical Lidocaine 4% cream applied to wound bed prior to debridement - in clinic only Wound #3 Right Lower Leg: Topical Lidocaine 4% cream applied to wound bed prior to debridement - in clinic only Wound #7 Left,Medial Lower Leg: Topical Lidocaine 4% cream applied to wound bed prior to debridement - in clinic only Wound #4 Left,Proximal,Lateral Lower Leg: Topical Lidocaine 4% cream applied to wound bed prior to debridement - in clinic only Wound #5 Left,Distal,Lateral Lower Leg: Topical Lidocaine 4% cream applied to wound bed prior to debridement - in clinic only Wound #6 Left,Anterior Lower Leg: Topical Lidocaine 4% cream applied to wound bed prior to debridement - in clinic only Primary Wound Dressing: Wound #2 Right,Dorsal Foot: Iodosorb Ointment Wound #7 Left,Medial Lower Leg: Iodosorb Ointment Wound #3 Right Lower Leg: Iodosorb Ointment Wound #4 Left,Proximal,Lateral Lower Leg: Iodosorb Ointment Wound #5 Left,Distal,Lateral Lower Leg: Iodosorb Ointment Wound #6 Left,Anterior Lower Leg: Iodosorb Ointment Secondary Dressing: Wound #2 Right,Dorsal Foot: ABD pad Dry Gauze Wound #7 Left,Medial Lower Leg: ABD pad Dry Gauze Wound #3 Right Lower Leg: ABD pad Dry Gauze Wound #4 Left,Proximal,Lateral Lower Leg: ABD pad Dry Gauze Wound #5 Left,Distal,Lateral Lower Leg: ABD pad Dry Gauze Wound #6 Left,Anterior Lower Leg: ABD pad Dry Gauze Schetter, Williette H. ([id number]) Dressing Change Frequency: Wound #2 Right,Dorsal Foot: Three times weekly Wound #3 Right  Lower Leg: Three times weekly Wound #7 Left,Medial Lower Leg: Three times weekly Wound #4 Left,Proximal,Lateral Lower Leg: Three times weekly Wound #5 Left,Distal,Lateral Lower Leg: Three times weekly Wound #6 Left,Anterior Lower Leg:  Three times weekly Follow-up Appointments: Wound #2 Right,Dorsal Foot: Return Appointment in 1 week. Wound #7 Left,Medial Lower Leg: Return Appointment in 1 week. Wound #3 Right Lower Leg: Return Appointment in 1 week. Wound #4 Left,Proximal,Lateral Lower Leg: Return Appointment in 1 week. Wound #5 Left,Distal,Lateral Lower Leg: Return Appointment in 1 week. Wound #6 Left,Anterior Lower Leg: Return Appointment in 1 week. Edema Control: Wound #2 Right,Dorsal Foot: 2 Layer Lite Compression System - Bilateral Patient to wear own compression stockings - HHRN please order compression hose for patient 20- 30mmHg Elevate legs to the level of the heart and pump ankles as often as possible Wound #7 Left,Medial Lower Leg: 2 Layer Lite Compression System - Bilateral Patient to wear own compression stockings - HHRN please order compression hose for patient 20- 80mmHg Elevate legs to the level of the heart and pump ankles as often as possible Wound #3 Right Lower Leg: 2 Layer Lite Compression System - Bilateral Patient to wear own compression stockings - HHRN please order compression hose for patient 20- 56mmHg Elevate legs to the level of the heart and pump ankles as often as possible Wound #4 Left,Proximal,Lateral Lower Leg: 2 Layer Lite Compression System - Bilateral Patient to wear own compression stockings - HHRN please order compression hose for patient 20- 39mmHg Elevate legs to the level of the heart and pump ankles as often as possible Wound #5 Left,Distal,Lateral Lower Leg: 2 Layer Lite Compression System - Bilateral Patient to wear own compression stockings - HHRN please order compression hose for patient 20- 69mmHg Bulger, Mylah H.  ([id number]) Elevate legs to the level of the heart and pump ankles as often as possible Wound #6 Left,Anterior Lower Leg: 2 Layer Lite Compression System - Bilateral Patient to wear own compression stockings - HHRN please order compression hose for patient 20- 64mmHg Elevate legs to the level of the heart and pump ankles as often as possible Additional Orders / Instructions: Wound #2 Right,Dorsal Foot: Stop Smoking Increase protein intake. Wound #3 Right Lower Leg: Stop Smoking Increase protein intake. Wound #7 Left,Medial Lower Leg: Stop Smoking Increase protein intake. Wound #4 Left,Proximal,Lateral Lower Leg: Stop Smoking Increase protein intake. Wound #5 Left,Distal,Lateral Lower Leg: Stop Smoking Increase protein intake. Wound #6 Left,Anterior Lower Leg: Stop Smoking Increase protein intake. Home Health: Wound #2 Right,Dorsal Foot: [place] Visits - Advanced ********[facility] please order compression hose for patient 20- 35mmHg********* Home Health Nurse may visit PRN to address patient s wound care needs. FACE TO FACE ENCOUNTER: MEDICARE and MEDICAID PATIENTS: I certify that this patient is under my care and that I had a face-to-face encounter that meets the physician face-to-face encounter requirements with this patient on this date. The encounter with the patient was in whole or in part for the following MEDICAL CONDITION: (primary reason for [place]) MEDICAL NECESSITY: I certify, that based on my findings, NURSING services are a medically necessary home health service. HOME BOUND STATUS: I certify that my clinical findings support that this patient is homebound (i.e., Due to illness or injury, pt requires aid of supportive devices such as crutches, cane, wheelchairs, walkers, the use of special transportation or the assistance of another person to leave their place of residence. There is a normal inability to leave the home and doing so requires  considerable and taxing effort. Other absences are for medical reasons / religious services and are infrequent or of short duration when for other reasons). If current dressing causes regression in wound condition, may D/C ordered  dressing product/s and apply Normal Saline Moist Dressing daily until next [place] / Other MD appointment. [place] of regression in wound condition at [phone]. Please direct any NON-WOUND related issues/requests for orders to patient's Primary Care Physician Wound #7 Left,Medial Lower Leg: [place] Visits - Advanced ********[facility] please order compression hose for patient 20- 63mmHg********* Home Health Nurse may visit PRN to address patient s wound care needs. FACE TO FACE ENCOUNTER: MEDICARE and MEDICAID PATIENTS: I certify that this patient is under my care and that I had a face-to-face encounter that meets the physician face-to-face encounter requirements with this patient on this date. The encounter with the patient was in whole or in part for the [facility] ([id number]) following MEDICAL CONDITION: (primary reason for [employer]) MEDICAL NECESSITY: I certify, that based on my findings, NURSING services are a medically necessary home health service. HOME BOUND STATUS: I certify that my clinical findings support that this patient is homebound (i.e., Due to illness or injury, pt requires aid of supportive devices such as crutches, cane, wheelchairs, walkers, the use of special transportation or the assistance of another person to leave their place of residence. There is a normal inability to leave the home and doing so requires considerable and taxing effort. Other absences are for medical reasons / religious services and are infrequent or of short duration when for other reasons). If current dressing causes regression in wound condition, may D/C ordered dressing product/s and apply Normal Saline Moist  Dressing daily until next [place] / Other MD appointment. [place] of regression in wound condition at [phone]. Please direct any NON-WOUND related issues/requests for orders to patient's Primary Care Physician Wound #3 Right Lower Leg: [place] Visits - Advanced *********[facility] please order compression hose for patient 20- 6mmHg********* Home Health Nurse may visit PRN to address patient s wound care needs. FACE TO FACE ENCOUNTER: MEDICARE and MEDICAID PATIENTS: I certify that this patient is under my care and that I had a face-to-face encounter that meets the physician face-to-face encounter requirements with this patient on this date. The encounter with the patient was in whole or in part for the following MEDICAL CONDITION: (primary reason for [place]) MEDICAL NECESSITY: I certify, that based on my findings, NURSING services are a medically necessary home health service. HOME BOUND STATUS: I certify that my clinical findings support that this patient is homebound (i.e., Due to illness or injury, pt requires aid of supportive devices such as crutches, cane, wheelchairs, walkers, the use of special transportation or the assistance of another person to leave their place of residence. There is a normal inability to leave the home and doing so requires considerable and taxing effort. Other absences are for medical reasons / religious services and are infrequent or of short duration when for other reasons). If current dressing causes regression in wound condition, may D/C ordered dressing product/s and apply Normal Saline Moist Dressing daily until next [place] / Other MD appointment. [place] of regression in wound condition at [phone]. Please direct any NON-WOUND related issues/requests for orders to patient's Primary Care Physician Wound #4 Left,Proximal,Lateral Lower Leg: [place] Visits -  Advanced *********[facility] please order compression hose for patient 20- 59mmHg********* Home Health Nurse may visit PRN to address patient s wound care needs. FACE TO FACE ENCOUNTER: MEDICARE and MEDICAID PATIENTS: I certify that this patient is under my care and that I had  a face-to-face encounter that meets the physician face-to-face encounter requirements with this patient on this date. The encounter with the patient was in whole or in part for the following MEDICAL CONDITION: (primary reason for [name]) MEDICAL NECESSITY: I certify, that based on my findings, NURSING services are a medically necessary home health service. HOME BOUND STATUS: I certify that my clinical findings support that this patient is homebound (i.e., Due to illness or injury, pt requires aid of supportive devices such as crutches, cane, wheelchairs, walkers, the use of special transportation or the assistance of another person to leave their place of residence. There is a normal inability to leave the home and doing so requires considerable and taxing effort. Other absences are for medical reasons / religious services and are infrequent or of short duration when for other reasons). If current dressing causes regression in wound condition, may D/C ordered dressing product/s and apply Normal Saline Moist Dressing daily until next [place] / Other MD appointment. [place] of regression in wound condition at [phone]. Please direct any NON-WOUND related issues/requests for orders to patient's Primary Care Physician Wound #5 Left,Distal,Lateral Lower Leg: [place] Visits - Advanced *********[facility] please order compression hose for patient 20- 15mmHg********* Home Health Nurse may visit PRN to address patient s wound care needs. EVILYN, KAZARIAN ([id number]) FACE TO FACE ENCOUNTER: MEDICARE and MEDICAID PATIENTS: I certify that this patient is under my care and that I had a  face-to-face encounter that meets the physician face-to-face encounter requirements with this patient on this date. The encounter with the patient was in whole or in part for the following MEDICAL CONDITION: (primary reason for [place]) MEDICAL NECESSITY: I certify, that based on my findings, NURSING services are a medically necessary home health service. HOME BOUND STATUS: I certify that my clinical findings support that this patient is homebound (i.e., Due to illness or injury, pt requires aid of supportive devices such as crutches, cane, wheelchairs, walkers, the use of special transportation or the assistance of another person to leave their place of residence. There is a normal inability to leave the home and doing so requires considerable and taxing effort. Other absences are for medical reasons / religious services and are infrequent or of short duration when for other reasons). If current dressing causes regression in wound condition, may D/C ordered dressing product/s and apply Normal Saline Moist Dressing daily until next [place] / Other MD appointment. [place] of regression in wound condition at [phone]. Please direct any NON-WOUND related issues/requests for orders to patient's Primary Care Physician Wound #6 Left,Anterior Lower Leg: [place] Visits - Advanced *********[facility] please order compression hose for patient 20- 31mmHg********* Home Health Nurse may visit PRN to address patient s wound care needs. FACE TO FACE ENCOUNTER: MEDICARE and MEDICAID PATIENTS: I certify that this patient is under my care and that I had a face-to-face encounter that meets the physician face-to-face encounter requirements with this patient on this date. The encounter with the patient was in whole or in part for the following MEDICAL CONDITION: (primary reason for [place]) MEDICAL NECESSITY: I certify, that based on my findings, NURSING  services are a medically necessary home health service. HOME BOUND STATUS: I certify that my clinical findings support that this patient is homebound (i.e., Due to illness or injury, pt requires aid of supportive devices such as crutches, cane, wheelchairs, walkers, the use of special transportation or the assistance  of another person to leave their place of residence. There is a normal inability to leave the home and doing so requires considerable and taxing effort. Other absences are for medical reasons / religious services and are infrequent or of short duration when for other reasons). If current dressing causes regression in wound condition, may D/C ordered dressing product/s and apply Normal Saline Moist Dressing daily until next [place] / Other MD appointment. [place] of regression in wound condition at [phone]. Please direct any NON-WOUND related issues/requests for orders to patient's Primary Care Physician Follow-Up Appointments: A follow-up appointment should be scheduled. Medication Reconciliation completed and provided to Patient/Care Provider. At this point in time today I didn't debride all wounds of the left and right lower extremity at this point in time. Patient tolerated this well with some discomfort but it was not unbearable I did check on her frequently throughout the procedure. Bleeding was noted at each wound site and this was debrided to subcutaneous though slough was removed in the process. For the time being we are going to change her to Iodosorb ointment and we are to have her compression wraps changed 3 times a week. This will be twice with home health and once a week here in the clinic. I do think that the compression wraps are the best thing that can be done at this point in time coupled with debridement and overall good wound care. MARYHELEN, MCELENEY ([id number]) We will see her for reevaluation in one week to see where things  stand at that point in time. She did inquire about something for pain. I explained that we do not write pain medications at this facility. With that being said I do believe she can alternate Tylenol and ibuprofen every 4 hours to help with discomfort. I advised her not to take ibuprofen and Aleve simultaneously. Electronic Signature(s) Signed: 02/07/2016 9:39:47 AM By: Worthy Keeler PA-C Entered By: Worthy Keeler on 02/06/2016 17:13:58 [name] ([id number]) -------------------------------------------------------------------------------- SuperBill Details Patient Name: [name] Date of Service: 02/06/2016 Medical Record Number: [id number] Patient Account Number: [id number] Date of Birth/Sex: 1950/07/14 (65 y.o. 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 calf with fat layer exposed F17.218 Nicotine dependence, cigarettes, with other nicotine-induced disorders Facility Procedures CPT4 Code Description: [id number] 11042 - DEB SUBQ TISSUE 20 SQ CM/< ICD-10 Description Diagnosis L97.512 Non-pressure chronic ulcer of other part of right fo L97.212 Non-pressure chronic ulcer of right calf with fat la L97.222 Non-pressure chronic ulcer  of left calf with fat lay G35 Multiple sclerosis Modifier: ot with fat la yer exposed er exposed Quantity: 1 yer exposed CPT4 Code Description: [id number] 11045 - DEB SUBQ TISS EA ADDL 20CM ICD-10 Description Diagnosis L97.512 Non-pressure chronic ulcer of other part of right fo  L97.212 Non-pressure chronic ulcer of right calf with fat la L97.222 Non-pressure chronic ulcer  of left calf with fat lay G35 Multiple sclerosis Modifier: ot with fat la yer exposed er exposed Quantity: 1 yer exposed Physician Procedures CPT4 Code Description: [id number] 11042 - WC PHYS SUBQ TISS 20 SQ CM ICD-10 Description  Diagnosis L97.512 Non-pressure chronic ulcer of other part of right foo L97.212 Non-pressure chronic ulcer of right calf with fat lay RODELLA, LOPER ([id number]) Modifier: t with fat lay er exposed Quantity: 1 er exposed Electronic Signature(s) Signed: 02/07/2016 9:39:47 AM By: Worthy Keeler PA-C Entered By: Worthy Keeler on 02/06/2016 17:14:41

## 2016-02-08 ENCOUNTER — Ambulatory Visit: Payer: Medicare Other | Admitting: Family Medicine

## 2016-02-09 DIAGNOSIS — L97219 Non-pressure chronic ulcer of right calf with unspecified severity: Secondary | ICD-10-CM | POA: Diagnosis not present

## 2016-02-09 DIAGNOSIS — L97929 Non-pressure chronic ulcer of unspecified part of left lower leg with unspecified severity: Secondary | ICD-10-CM | POA: Diagnosis not present

## 2016-02-09 DIAGNOSIS — I70234 Atherosclerosis of native arteries of right leg with ulceration of heel and midfoot: Secondary | ICD-10-CM | POA: Diagnosis not present

## 2016-02-09 DIAGNOSIS — Z792 Long term (current) use of antibiotics: Secondary | ICD-10-CM | POA: Diagnosis not present

## 2016-02-09 DIAGNOSIS — G35 Multiple sclerosis: Secondary | ICD-10-CM | POA: Diagnosis not present

## 2016-02-09 DIAGNOSIS — L97419 Non-pressure chronic ulcer of right heel and midfoot with unspecified severity: Secondary | ICD-10-CM | POA: Diagnosis not present

## 2016-02-09 DIAGNOSIS — R339 Retention of urine, unspecified: Secondary | ICD-10-CM | POA: Diagnosis not present

## 2016-02-09 DIAGNOSIS — J449 Chronic obstructive pulmonary disease, unspecified: Secondary | ICD-10-CM | POA: Diagnosis not present

## 2016-02-09 DIAGNOSIS — I70232 Atherosclerosis of native arteries of right leg with ulceration of calf: Secondary | ICD-10-CM | POA: Diagnosis not present

## 2016-02-09 DIAGNOSIS — I70249 Atherosclerosis of native arteries of left leg with ulceration of unspecified site: Secondary | ICD-10-CM | POA: Diagnosis not present

## 2016-02-09 DIAGNOSIS — I10 Essential (primary) hypertension: Secondary | ICD-10-CM | POA: Diagnosis not present

## 2016-02-09 DIAGNOSIS — L03116 Cellulitis of left lower limb: Secondary | ICD-10-CM | POA: Diagnosis not present

## 2016-02-12 DIAGNOSIS — I70249 Atherosclerosis of native arteries of left leg with ulceration of unspecified site: Secondary | ICD-10-CM | POA: Diagnosis not present

## 2016-02-12 DIAGNOSIS — R339 Retention of urine, unspecified: Secondary | ICD-10-CM | POA: Diagnosis not present

## 2016-02-12 DIAGNOSIS — I10 Essential (primary) hypertension: Secondary | ICD-10-CM | POA: Diagnosis not present

## 2016-02-12 DIAGNOSIS — I70234 Atherosclerosis of native arteries of right leg with ulceration of heel and midfoot: Secondary | ICD-10-CM | POA: Diagnosis not present

## 2016-02-12 DIAGNOSIS — L03116 Cellulitis of left lower limb: Secondary | ICD-10-CM | POA: Diagnosis not present

## 2016-02-12 DIAGNOSIS — L97219 Non-pressure chronic ulcer of right calf with unspecified severity: Secondary | ICD-10-CM | POA: Diagnosis not present

## 2016-02-12 DIAGNOSIS — I70232 Atherosclerosis of native arteries of right leg with ulceration of calf: Secondary | ICD-10-CM | POA: Diagnosis not present

## 2016-02-12 DIAGNOSIS — L97929 Non-pressure chronic ulcer of unspecified part of left lower leg with unspecified severity: Secondary | ICD-10-CM | POA: Diagnosis not present

## 2016-02-12 DIAGNOSIS — Z792 Long term (current) use of antibiotics: Secondary | ICD-10-CM | POA: Diagnosis not present

## 2016-02-12 DIAGNOSIS — G35 Multiple sclerosis: Secondary | ICD-10-CM | POA: Diagnosis not present

## 2016-02-12 DIAGNOSIS — L97419 Non-pressure chronic ulcer of right heel and midfoot with unspecified severity: Secondary | ICD-10-CM | POA: Diagnosis not present

## 2016-02-12 DIAGNOSIS — J449 Chronic obstructive pulmonary disease, unspecified: Secondary | ICD-10-CM | POA: Diagnosis not present

## 2016-02-13 ENCOUNTER — Encounter: Payer: Medicare Other | Admitting: Internal Medicine

## 2016-02-13 DIAGNOSIS — G4733 Obstructive sleep apnea (adult) (pediatric): Secondary | ICD-10-CM | POA: Diagnosis not present

## 2016-02-13 DIAGNOSIS — L97222 Non-pressure chronic ulcer of left calf with fat layer exposed: Secondary | ICD-10-CM | POA: Diagnosis not present

## 2016-02-13 DIAGNOSIS — I739 Peripheral vascular disease, unspecified: Secondary | ICD-10-CM | POA: Diagnosis not present

## 2016-02-13 DIAGNOSIS — S81801A Unspecified open wound, right lower leg, initial encounter: Secondary | ICD-10-CM | POA: Diagnosis not present

## 2016-02-13 DIAGNOSIS — I10 Essential (primary) hypertension: Secondary | ICD-10-CM | POA: Diagnosis not present

## 2016-02-13 DIAGNOSIS — L97512 Non-pressure chronic ulcer of other part of right foot with fat layer exposed: Secondary | ICD-10-CM | POA: Diagnosis not present

## 2016-02-13 DIAGNOSIS — J449 Chronic obstructive pulmonary disease, unspecified: Secondary | ICD-10-CM | POA: Diagnosis not present

## 2016-02-13 DIAGNOSIS — L03116 Cellulitis of left lower limb: Secondary | ICD-10-CM | POA: Diagnosis not present

## 2016-02-13 DIAGNOSIS — S91301A Unspecified open wound, right foot, initial encounter: Secondary | ICD-10-CM | POA: Diagnosis not present

## 2016-02-13 DIAGNOSIS — G35 Multiple sclerosis: Secondary | ICD-10-CM | POA: Diagnosis not present

## 2016-02-13 DIAGNOSIS — L97212 Non-pressure chronic ulcer of right calf with fat layer exposed: Secondary | ICD-10-CM | POA: Diagnosis not present

## 2016-02-13 DIAGNOSIS — Z79899 Other long term (current) drug therapy: Secondary | ICD-10-CM | POA: Diagnosis not present

## 2016-02-14 DIAGNOSIS — L03116 Cellulitis of left lower limb: Secondary | ICD-10-CM | POA: Diagnosis not present

## 2016-02-14 NOTE — Progress Notes (Signed)
[name] Visit Report for 02/13/2016 Chief Complaint Document Details Patient Name: [name] Date of Service: 02/13/2016 1:30 PM Medical Record Patient Account Number: [id number] Number: Treating RN: Ahmed Prima 1951/01/05 (65 y.o. Other Clinician: Date of Birth/Sex: Female) Treating Caddie Randle Primary Care Physician/Extender: Peri Jefferson, [place] Physician: Referring Physician: LADA, MELINDA Weeks in Treatment: 12 Information Obtained from: Patient Chief Complaint Patient seen for complaints of Non-Healing Wound to the left and right lower extremity and the right dorsum of the foot Electronic Signature(s) Signed: 02/14/2016 8:01:59 AM By: Linton Ham MD Entered By: Linton Ham on 02/14/2016 07:38:28 [name] -------------------------------------------------------------------------------- HPI Details Patient Name: [name] Date of Service: 02/13/2016 1:30 PM Medical Record Patient Account Number: [id number] Number: Treating RN: Ahmed Prima [birth year] (65 y.o. Other Clinician: Date of Birth/Sex: Female) Treating Leanna Hamid Primary Care Physician/Extender: Peri Jefferson, [place] Physician: Referring Physician: LADA, MELINDA Weeks in Treatment: 12 History of Present Illness Location: left and right lower extremity and dorsum of the right foot Quality: Patient reports experiencing a sharp pain to affected area(s). Severity: Patient rates the pain to be a 8 out of 10 especially with palpation Duration: Patient has had the wound for > 6 months prior to seeking treatment at the wound center Timing: Pain in wound is Intermittent in severity but persistent Context: The wound appeared gradually over time Modifying Factors: Other treatment(s) tried include: symptomatic treatment as advised by her PCP Associated Signs and Symptoms: Patient reports having increase swelling iin her bilateral  lower extremities HPI Description: 65 year old with a past medical history significant for MS, urinary incontinence, and obesity. She has been seen in the wound clinic before for lower extremity ulcerations treated with compression therapy. she is also known to have hypertension, peripheral vascular disease, COPD, obstructive sleep apnea, lumbar radiculopathy, kyphoscoliosis, urinary issues and tobacco abuse. Smokes a packet of cigarettes a day was recently seen at the [facility] for swelling of her legs and feet with a ulceration on the dorsum of the right foot which has been there for about 6 months. she was recently in the ER about a month ago where she was seen for shortness of breath and swelling of the legs and a chest x-ray was within normal limits had an increase in her BNP and was given Lasix and put on doxycycline for a mild cellulitis and possible UTI. Wounds aresmaller today 11/13/15. Debrided and will continue Santyl. 12/21/2015 -- she was admitted to the hospital overnight on 12/18/2015 and diagnosed with urinary retention and cellulitis of the left lower leg. is past to take clindamycin and use Santyl for her wounds. 01/15/2016 -- come back to see [place] for almost a month and continues to be noncompliant with her dressings 01/30/16 patient presents today for a follow-up visit concerning her ongoing bilateral lower extremity wounds. We have not seen her for the past 2 weeks although we are supposed to be seen her for weekly visits. She currently has a Foley catheter. She tells me that the wounds are intensely painful especially with pressure and palpation at this point in time. Fortunately she is having no interval signs or symptoms of systemic infection but unfortunately the wounds have not improved dramatically since we last saw her. She does have home health coming to take care of her as well. She is currently not in any compression wraps. 02/06/16 ON evaluation  today patient continues to experience discomfort regarding her bilateral lower extremity ulcers. She has  continued to tolerate the dressing changes at this point in time and continues to have a Foley catheter as well. Fortunately she is back this week in the past she has been somewhat noncompliant with follow-ups I'm glad to see her today. She tells me that her pain level varies but can be as Hettinger, Sue H. ([id number]) high as a 7 out of 10 with manipulation of the wound. She tells me that she used to be on oxycodone which was managed by the pain clinic although she is no longer on that as she tells me that she was actually smoking marijuana at the time and when this was found out they discontinued her pain medication. She no longer is taking anything pain medication wise and she also does not smoke cigarettes nor marijuana at this point. 02/12/16; this is a patient I don't believe I have previously seen. She has multiple sclerosis. She has 4 punched-out areas on the anterior lateral left leg 2 areas on the right these are all in the same condition. Reasonably small [dime size] wounds each was some depth. Surface of these wounds does not look particularly healthy as there is adherent slough. There is no evidence of surrounding infection or inflammation. ABIs in this clinic were 0.87 on the right and 0.81 on the left. She is listed as having PAD and is a smoker. Not a diabetic Electronic Signature(s) Signed: 02/14/2016 8:01:59 AM By: Linton Ham MD Entered By: Linton Ham on 02/14/2016 07:42:27 [name] -------------------------------------------------------------------------------- Physical Exam Details Patient Name: [name] Date of Service: 02/13/2016 1:30 PM Medical Record Patient Account Number: [id number] Number: Treating RN: Ahmed Prima 14-Oct-1950 (65 y.o. Other Clinician: Date of Birth/Sex: Female) Treating Mackensey Bolte Primary  Care Physician/Extender: Peri Jefferson, [place] Physician: Referring Physician: LADA, MELINDA Weeks in Treatment: 12 Constitutional Sitting or standing Blood Pressure is within target range for patient.. Pulse regular and within target range for patient.Marland Kitchen Respirations regular, non-labored and within target range.. Temperature is normal and within the target range for the patient.. Patient's appearance is neat and clean. Appears in no acute distress. Well nourished and well developed.. Eyes Conjunctivae clear. No discharge.Marland Kitchen Respiratory Respiratory effort is easy and symmetric bilaterally. Rate is normal at rest and on room air.. Cardiovascular Pedal pulses are palpable bilaterally. No edema no significant venous insufficiency. Lymphatic None palpable in the popliteal and inguinal area. Integumentary (Hair, Skin) No other abnormalities noted on inspection of the skin. Marland Kitchen Psychiatric No evidence of depression, anxiety, or agitation. Calm, cooperative, and communicative. Appropriate interactions and affect.. Notes Wound exam; the patient has a cluster of 4 small punched out wounds on the anterior lateral left leg mid aspect. These are painful no surrounding erythema. Each of the wound has adherent slough however I did not attempt debridement today. She has 1on the right leg as well one and 1 on the right dorsal foot These are in similar condition to the areas on the left. Electronic Signature(s) Signed: 02/14/2016 8:01:59 AM By: Linton Ham MD Entered By: Linton Ham on 02/14/2016 07:47:45 [name] -------------------------------------------------------------------------------- Physician Orders Details Patient Name: [name] Date of Service: 02/13/2016 1:30 PM Medical Record Patient Account Number: [id number] Number: Treating RN: Ahmed Prima 09-14-1950 (65 y.o. Other Clinician: Date of Birth/Sex: Female) Treating Berdell Nevitt Primary  Care Physician/Extender: Peri Jefferson, [place] Physician: Referring Physician: LADA, MELINDA Weeks in Treatment: 33 Verbal / Phone Orders: Yes Clinician: Pinkerton, Debi Read Back and Verified: Yes Diagnosis Coding Wound  Cleansing Wound #2 Right,Dorsal Foot o Cleanse wound with mild soap and water - HHRN to wash legs with soap and water o No tub bath. Wound #3 Right Lower Leg o Cleanse wound with mild soap and water - HHRN to wash legs with soap and water o No tub bath. Wound #4 Left,Proximal,Lateral Lower Leg o Cleanse wound with mild soap and water - HHRN to wash legs with soap and water o No tub bath. Wound #5 Left,Distal,Lateral Lower Leg o Cleanse wound with mild soap and water - HHRN to wash legs with soap and water o No tub bath. Wound #6 Left,Anterior Lower Leg o Cleanse wound with mild soap and water - HHRN to wash legs with soap and water o No tub bath. Wound #7 Left,Medial Lower Leg o Cleanse wound with mild soap and water - HHRN to wash legs with soap and water o No tub bath. Anesthetic Wound #2 Right,Dorsal Foot o Topical Lidocaine 4% cream applied to wound bed prior to debridement - in clinic only Wound #3 Right Lower Leg o Topical Lidocaine 4% cream applied to wound bed prior to debridement - in clinic only Wound #4 Left,Proximal,Lateral Lower Leg o Topical Lidocaine 4% cream applied to wound bed prior to debridement - in clinic only [name], [name] H. ([id number]) Wound #5 Left,Distal,Lateral Lower Leg o Topical Lidocaine 4% cream applied to wound bed prior to debridement - in clinic only Wound #6 Left,Anterior Lower Leg o Topical Lidocaine 4% cream applied to wound bed prior to debridement - in clinic only Wound #7 Left,Medial Lower Leg o Topical Lidocaine 4% cream applied to wound bed prior to debridement - in clinic only Primary Wound Dressing Wound #2 Right,Dorsal Foot o Santyl Ointment Wound #3 Right Lower Leg o Santyl  Ointment Wound #4 Left,Proximal,Lateral Lower Leg o Aquacel Ag o [place] Ag - IN CLINIC Wound #5 Left,Distal,Lateral Lower Leg o Aquacel Ag o [place] Ag - IN CLINIC Wound #6 Left,Anterior Lower Leg o Aquacel Ag o [place] Ag - IN CLINIC Wound #7 Left,Medial Lower Leg o Aquacel Ag o [place] Ag - IN CLINIC Secondary Dressing Wound #2 Right,Dorsal Foot o ABD pad o Dry Gauze Wound #3 Right Lower Leg o ABD pad o Dry Gauze Wound #4 Left,Proximal,Lateral Lower Leg o ABD pad o Dry Gauze Wound #5 Left,Distal,Lateral Lower Leg o ABD pad [name], [name] H. ([id number]) o Dry Gauze Wound #6 Left,Anterior Lower Leg o ABD pad o Dry Gauze Wound #7 Left,Medial Lower Leg o ABD pad o Dry Gauze Dressing Change Frequency Wound #2 Right,Dorsal Foot o Three times weekly Wound #3 Right Lower Leg o Three times weekly Wound #4 Left,Proximal,Lateral Lower Leg o Three times weekly Wound #5 Left,Distal,Lateral Lower Leg o Three times weekly Wound #6 Left,Anterior Lower Leg o Three times weekly Wound #7 Left,Medial Lower Leg o Three times weekly Follow-up Appointments Wound #2 Right,Dorsal Foot o Return Appointment in 1 week. Wound #3 Right Lower Leg o Return Appointment in 1 week. Wound #4 Left,Proximal,Lateral Lower Leg o Return Appointment in 1 week. Wound #5 Left,Distal,Lateral Lower Leg o Return Appointment in 1 week. Wound #6 Left,Anterior Lower Leg o Return Appointment in 1 week. Wound #7 Left,Medial Lower Leg o Return Appointment in 1 week. [name], [name] ([id number]) Edema Control Wound #2 Right,Dorsal Foot o 3 Layer Compression System - Bilateral - UNNA TO ANCHOR o Patient to wear own compression stockings - HHRN please order compression hose for patient 20-46mmHg o Elevate legs to the level of the  heart and pump ankles as often as possible Wound #3 Right Lower Leg o 3 Layer Compression System -  Bilateral - UNNA TO ANCHOR o Patient to wear own compression stockings - HHRN please order compression hose for patient 20-26mmHg o Elevate legs to the level of the heart and pump ankles as often as possible Wound #4 Left,Proximal,Lateral Lower Leg o 3 Layer Compression System - Bilateral - UNNA TO ANCHOR o Patient to wear own compression stockings - HHRN please order compression hose for patient 20-54mmHg o Elevate legs to the level of the heart and pump ankles as often as possible Wound #5 Left,Distal,Lateral Lower Leg o 3 Layer Compression System - Bilateral - UNNA TO ANCHOR o Patient to wear own compression stockings - HHRN please order compression hose for patient 20-64mmHg o Elevate legs to the level of the heart and pump ankles as often as possible Wound #6 Left,Anterior Lower Leg o 3 Layer Compression System - Bilateral - UNNA TO ANCHOR o Patient to wear own compression stockings - HHRN please order compression hose for patient 20-44mmHg o Elevate legs to the level of the heart and pump ankles as often as possible Wound #7 Left,Medial Lower Leg o 3 Layer Compression System - Bilateral - UNNA TO ANCHOR o Patient to wear own compression stockings - HHRN please order compression hose for patient 20-75mmHg o Elevate legs to the level of the heart and pump ankles as often as possible Additional Orders / Instructions Wound #2 Right,Dorsal Foot o Stop Smoking o Increase protein intake. Wound #3 Right Lower Leg o Stop Smoking o Increase protein intake. Wound #4 Left,Proximal,Lateral Lower Leg o Stop Smoking o Increase protein intake. [name], [name] Wound #5 Left,Distal,Lateral Lower Leg o Stop Smoking o Increase protein intake. Wound #6 Left,Anterior Lower Leg o Stop Smoking o Increase protein intake. Wound #7 Left,Medial Lower Leg o Stop Smoking o Increase protein intake. Home Health Wound #2 [place] Visits - Advanced ********[facility] please order compression hose for patient 20-24mmHg********* o Home Health Nurse may visit PRN to address patientos wound care needs. o FACE TO FACE ENCOUNTER: MEDICARE and MEDICAID PATIENTS: I certify that this patient is under my care and that I had a face-to-face encounter that meets the physician face-to-face encounter requirements with this patient on this date. The encounter with the patient was in whole or in part for the following MEDICAL CONDITION: (primary reason for [place]) MEDICAL NECESSITY: I certify, that based on my findings, NURSING services are a medically necessary home health service. HOME BOUND STATUS: I certify that my clinical findings support that this patient is homebound (i.e., Due to illness or injury, pt requires aid of supportive devices such as crutches, cane, wheelchairs, walkers, the use of special transportation or the assistance of another person to leave their place of residence. There is a normal inability to leave the home and doing so requires considerable and taxing effort. Other absences are for medical reasons / religious services and are infrequent or of short duration when for other reasons). o If current dressing causes regression in wound condition, may D/C ordered dressing product/s and apply Normal Saline Moist Dressing daily until next [place] / Other MD appointment. [place] of regression in wound condition at [phone]. o Please direct any NON-WOUND related issues/requests for orders to patient's Primary Care Physician Wound #3 Right Lower Leg o [place] Visits - Advanced *********[facility] please order compression hose for  patient 20-16mmHg********* o Home Health Nurse may visit PRN to address patientos wound care needs. o FACE TO FACE ENCOUNTER: MEDICARE and MEDICAID PATIENTS: I certify that this patient is under my  care and that I had a face-to-face encounter that meets the physician face-to-face encounter requirements with this patient on this date. The encounter with the patient was in whole or in part for the following MEDICAL CONDITION: (primary reason for [place]) MEDICAL NECESSITY: I certify, that based on my findings, NURSING services are a medically necessary home health service. HOME BOUND STATUS: I certify that my clinical findings support that this patient is homebound (i.e., Due to illness or injury, pt requires aid of supportive devices such as crutches, cane, wheelchairs, walkers, the use of special transportation or the assistance of another person to leave their place of residence. There is a Mellinger, Xoie H. ([id number]) normal inability to leave the home and doing so requires considerable and taxing effort. Other absences are for medical reasons / religious services and are infrequent or of short duration when for other reasons). o If current dressing causes regression in wound condition, may D/C ordered dressing product/s and apply Normal Saline Moist Dressing daily until next [place] / Other MD appointment. [place] of regression in wound condition at [phone]. o Please direct any NON-WOUND related issues/requests for orders to patient's Primary Care Physician Wound #4 Left,Proximal,Lateral Lower Leg o [place] Visits - Advanced *********[facility] please order compression hose for patient 20-83mmHg********* o Home Health Nurse may visit PRN to address patientos wound care needs. o FACE TO FACE ENCOUNTER: MEDICARE and MEDICAID PATIENTS: I certify that this patient is under my care and that I had a face-to-face encounter that meets the physician face-to-face encounter requirements with this patient on this date. The encounter with the patient was in whole or in part for the following MEDICAL CONDITION: (primary reason for [place]) MEDICAL NECESSITY: I certify, that based on my findings, NURSING services are a medically necessary home health service. HOME BOUND STATUS: I certify that my clinical findings support that this patient is homebound (i.e., Due to illness or injury, pt requires aid of supportive devices such as crutches, cane, wheelchairs, walkers, the use of special transportation or the assistance of another person to leave their place of residence. There is a normal inability to leave the home and doing so requires considerable and taxing effort. Other absences are for medical reasons / religious services and are infrequent or of short duration when for other reasons). o If current dressing causes regression in wound condition, may D/C ordered dressing product/s and apply Normal Saline Moist Dressing daily until next [place] / Other MD appointment. [place] of regression in wound condition at [phone]. o Please direct any NON-WOUND related issues/requests for orders to patient's Primary Care Physician Wound #5 Left,Distal,Lateral Lower Leg o [place] Visits - Advanced *********[facility] please order compression hose for patient 20-57mmHg********* o Home Health Nurse may visit PRN to address patientos wound care needs. o FACE TO FACE ENCOUNTER: MEDICARE and MEDICAID PATIENTS: I certify that this patient is under my care and that I had a face-to-face encounter that meets the physician face-to-face encounter requirements with this patient on this date. The encounter with the patient was in whole or in part for the following MEDICAL CONDITION: (primary reason for [place]) MEDICAL NECESSITY: I certify, that based on my findings, NURSING services are a medically necessary home  health service. HOME BOUND STATUS: I certify that my clinical findings support that this patient is homebound (i.e., Due to illness or injury, pt requires aid  of supportive devices such as crutches, cane, wheelchairs, walkers, the use of special transportation or the assistance of another person to leave their place of residence. There is a normal inability to leave the home and doing so requires considerable and taxing effort. Other absences are for medical reasons / religious services and are infrequent or of short duration when for other reasons). o If current dressing causes regression in wound condition, may D/C ordered dressing product/s and apply Normal Saline Moist Dressing daily until next [place] / Other MD appointment. [place] of regression in wound condition at [phone]. [name], [name] ([id number]) o Please direct any NON-WOUND related issues/requests for orders to patient's Primary Care Physician Wound #6 [place] Visits - Advanced *********[facility] please order compression hose for patient 20-39mmHg********* o Home Health Nurse may visit PRN to address patientos wound care needs. o FACE TO FACE ENCOUNTER: MEDICARE and MEDICAID PATIENTS: I certify that this patient is under my care and that I had a face-to-face encounter that meets the physician face-to-face encounter requirements with this patient on this date. The encounter with the patient was in whole or in part for the following MEDICAL CONDITION: (primary reason for [place]) MEDICAL NECESSITY: I certify, that based on my findings, NURSING services are a medically necessary home health service. HOME BOUND STATUS: I certify that my clinical findings support that this patient is homebound (i.e., Due to illness or injury, pt requires aid of supportive devices such as crutches, cane, wheelchairs, walkers, the use of special transportation or the assistance of another person to leave their place of residence. There is a normal inability to leave the home and doing so requires considerable and  taxing effort. Other absences are for medical reasons / religious services and are infrequent or of short duration when for other reasons). o If current dressing causes regression in wound condition, may D/C ordered dressing product/s and apply Normal Saline Moist Dressing daily until next [place] / Other MD appointment. [place] of regression in wound condition at [phone]. o Please direct any NON-WOUND related issues/requests for orders to patient's Primary Care Physician Wound #7 Left,Medial Lower Leg o [place] Visits - Advanced ********[facility] please order compression hose for patient 20-71mmHg********* o Home Health Nurse may visit PRN to address patientos wound care needs. o FACE TO FACE ENCOUNTER: MEDICARE and MEDICAID PATIENTS: I certify that this patient is under my care and that I had a face-to-face encounter that meets the physician face-to-face encounter requirements with this patient on this date. The encounter with the patient was in whole or in part for the following MEDICAL CONDITION: (primary reason for [place]) MEDICAL NECESSITY: I certify, that based on my findings, NURSING services are a medically necessary home health service. HOME BOUND STATUS: I certify that my clinical findings support that this patient is homebound (i.e., Due to illness or injury, pt requires aid of supportive devices such as crutches, cane, wheelchairs, walkers, the use of special transportation or the assistance of another person to leave their place of residence. There is a normal inability to leave the home and doing so requires considerable and taxing effort. Other absences are for medical reasons / religious services and are infrequent or of short duration when for other reasons). o  If current dressing causes regression in wound condition, may D/C ordered dressing product/s and apply Normal Saline Moist Dressing daily until next  [place] / Other MD appointment. [place] of regression in wound condition at [phone]. o Please direct any NON-WOUND related issues/requests for orders to patient's Primary Care Physician Medications-please add to medication list. Wound #4 Left,Proximal,Lateral Lower Leg o P.O. Antibiotics - Doxycycline Castorena, Kyleen H. ([id number]) o Santyl Enzymatic Ointment Wound #5 Left,Distal,Lateral Lower Leg o P.O. Antibiotics - Doxycycline o Santyl Enzymatic Ointment Wound #6 Left,Anterior Lower Leg o P.O. Antibiotics - Doxycycline o Santyl Enzymatic Ointment Wound #7 Left,Medial Lower Leg o P.O. Antibiotics - Doxycycline o Santyl Enzymatic Ointment Patient Medications Allergies: No Known Drug Allergies Notifications Medication Indication Start End doxycycline hyclate DOSE 1 - oral 100 mg capsule - 1 capsule oral Electronic Signature(s) Signed: 02/13/2016 5:14:55 PM By: Alric Quan Signed: 02/14/2016 8:01:59 AM By: Linton Ham MD Entered By: Alric Quan on 02/13/2016 17:06:20 MERYSSA, BRANDER ([id number]) -------------------------------------------------------------------------------- Prescription 02/13/2016 Patient Name: [name] Physician: Ricard Dillon MD Date of Birth: 1950/05/30 NPI#: [id number] Sex: F DEA#: [id number] Phone #: [phone] License #: [id number] Patient Address: [place], [place] [address] [facility] [address] [place],  [address] [phone] Allergies No Known Drug Allergies Medication Medication: Route: Strength: Form: doxycycline hyclate oral 100 mg capsule Class: PERIODONTAL COLLAGENASE INHIBITORS Dose: Frequency / Time: Indication: 1 1 capsule oral Number of Refills: Number of Units: 0 Generic Substitution: Start Date: End Date: Administered at [place]: No Note to Pharmacy: [facility]): Date(s): Electronic Signature(s) ADALEAH, TREJO ([id number]) Signed: 02/13/2016 5:14:55 PM By: Alric Quan Signed: 02/14/2016 8:01:59 AM By: Linton Ham MD Entered By: Alric Quan on 02/13/2016 17:06:20 [name] ([id number]) --------------------------------------------------------------------------------  Problem List Details Patient Name: [name] Date of Service: 02/13/2016 1:30 PM Medical Record Patient Account Number: [id number] Number: Treating RN: Ahmed Prima [birth year] (65 y.o. 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 11/20/2015 Yes exposed F17.218 Nicotine dependence, cigarettes, with other nicotine- 11/20/2015 Yes induced disorders Inactive Problems Resolved Problems Electronic Signature(s) Signed: 02/14/2016 8:01:59 AM By: Linton Ham MD Entered By: Linton Ham on 02/14/2016 07:38:13 Blecha, [name] ([name], [name] ([id number]) -------------------------------------------------------------------------------- Progress Note Details Patient Name: [name] Date of Service: 02/13/2016 1:30 PM Medical Record Patient Account Number: [id number] Number: Treating RN: Ahmed Prima [birth year] (65 y.o. Other Clinician: Date of Birth/Sex: Female) Treating Audie Wieser Primary Care  Physician/Extender: G LADA, [place] Physician: Referring Physician: LADA, MELINDA Weeks in Treatment: 12 Subjective Chief Complaint Information obtained from Patient Patient seen for complaints of Non-Healing Wound to the left and right lower extremity and the right dorsum of the foot History of Present Illness (HPI) The following HPI elements were documented for the patient's wound: Location: left and right lower extremity and dorsum of the right foot Quality: Patient reports experiencing a sharp pain to affected area(s). Severity: Patient rates  the pain to be a 8 out of 10 especially with palpation Duration: Patient has had the wound for > 6 months prior to seeking treatment at the wound center Timing: Pain in wound is Intermittent in severity but persistent Context: The wound appeared gradually over time Modifying Factors: Other treatment(s) tried include: symptomatic treatment as advised by her PCP Associated Signs and Symptoms: Patient reports having increase swelling iin her bilateral lower extremities 65 year old with a past medical history significant for MS, urinary incontinence, and obesity. She has been seen in the wound clinic before for lower extremity ulcerations treated with compression therapy. she is also known to have hypertension, peripheral vascular disease, COPD, obstructive sleep apnea, lumbar radiculopathy, kyphoscoliosis, urinary issues and tobacco abuse. Smokes a packet of cigarettes a day was recently seen at the [facility] for swelling of her legs and feet with a ulceration on the dorsum of the right foot which has been there for about 6 months. she was recently in the ER about a month ago where she was seen for shortness of breath and swelling of the legs and a chest x-ray was within normal limits had an increase in her BNP and was given Lasix and put on doxycycline for a mild cellulitis and possible UTI. Wounds aresmaller today 11/13/15.  Debrided and will continue Santyl. 12/21/2015 -- she was admitted to the hospital overnight on 12/18/2015 and diagnosed with urinary retention and cellulitis of the left lower leg. is past to take clindamycin and use Santyl for her wounds. 01/15/2016 -- come back to see [place] for almost a month and continues to be noncompliant with her dressings 01/30/16 patient presents today for a follow-up visit concerning her ongoing bilateral lower extremity wounds. We have not seen her for the past 2 weeks although we are supposed to be seen her for weekly visits. She currently has a Foley catheter. She tells me that the wounds are intensely painful especially with pressure and palpation at this point in time. Fortunately she is having no interval signs or symptoms of systemic [name], [name] H. ([id number]) infection but unfortunately the wounds have not improved dramatically since we last saw her. She does have home health coming to take care of her as well. She is currently not in any compression wraps. 02/06/16 ON evaluation today patient continues to experience discomfort regarding her bilateral lower extremity ulcers. She has continued to tolerate the dressing changes at this point in time and continues to have a Foley catheter as well. Fortunately she is back this week in the past she has been somewhat noncompliant with follow-ups I'm glad to see her today. She tells me that her pain level varies but can be as high as a 7 out of 10 with manipulation of the wound. She tells me that she used to be on oxycodone which was managed by the pain clinic although she is no longer on that as she tells me that she was actually smoking marijuana at the time and when this was found out they discontinued her pain medication. She no longer is taking anything pain medication wise and she also does not smoke cigarettes nor marijuana at this point. 02/12/16; this is a patient I don't believe I have previously seen. She has  multiple sclerosis. She has 4 punched-out areas on the anterior lateral left leg 2 areas on the right these are all in the same condition. Reasonably small [dime size] wounds each was some depth. Surface of these wounds does not look  particularly healthy as there is adherent slough. There is no evidence of surrounding infection or inflammation. ABIs in this clinic were 0.87 on the right and 0.81 on the left. She is listed as having PAD and is a smoker. Not a diabetic Objective Constitutional Sitting or standing Blood Pressure is within target range for patient.. Pulse regular and within target range for patient.Marland Kitchen Respirations regular, non-labored and within target range.. Temperature is normal and within the target range for the patient.. Patient's appearance is neat and clean. Appears in no acute distress. Well nourished and well developed.. Vitals Time Taken: 1:57 PM, Height: 63 in, Weight: 270 lbs, BMI: 47.8, Temperature: 98.7 F, Pulse: 70 bpm, Respiratory Rate: 20 breaths/min, Blood Pressure: 137/65 mmHg. Eyes Conjunctivae clear. No discharge.Marland Kitchen Respiratory Respiratory effort is easy and symmetric bilaterally. Rate is normal at rest and on room air.. Cardiovascular Pedal pulses are palpable bilaterally. No edema no significant venous insufficiency. Lymphatic None palpable in the popliteal and inguinal area. Psychiatric No evidence of depression, anxiety, or agitation. Calm, cooperative, and communicative. Appropriate Reichart, Calla H. ([id number]) interactions and affect.. General Notes: Wound exam; the patient has a cluster of 4 small punched out wounds on the anterior lateral left leg mid aspect. These are painful no surrounding erythema. Each of the wound has adherent slough however I did not attempt debridement today. She has 1on the right leg as well one and 1 on the right dorsal foot These are in similar condition to the areas on the left. Integumentary (Hair, Skin) No other  abnormalities noted on inspection of the skin. Wound #2 status is Open. Original cause of wound was Trauma. The wound is located on the Right,Dorsal Foot. The wound measures 0.3cm length x 0.5cm width x 0.1cm depth; 0.118cm^2 area and 0.012cm^3 volume. The wound is limited to skin breakdown. There is no tunneling or undermining noted. There is a medium amount of serous drainage noted. The wound margin is flat and intact. There is no granulation within the wound bed. There is a large (67-100%) amount of necrotic tissue within the wound bed including Eschar. The periwound skin appearance exhibited: Localized Edema, Ecchymosis. The periwound skin appearance did not exhibit: Callus, Crepitus, Excoriation, Fluctuance, Friable, Induration, Rash, Scarring, Dry/Scaly, Maceration, Moist, Atrophie Blanche, Cyanosis, Hemosiderin Staining, Mottled, Pallor, Rubor, Erythema. Wound #3 status is Open. Original cause of wound was Trauma. The wound is located on the Right Lower Leg. The wound measures 1cm length x 1.5cm width x 0.2cm depth; 1.178cm^2 area and 0.236cm^3 volume. The wound is limited to skin breakdown. There is no tunneling or undermining noted. There is a large amount of serous drainage noted. The wound margin is flat and intact. There is no granulation within the wound bed. There is a large (67-100%) amount of necrotic tissue within the wound bed including Adherent Slough. The periwound skin appearance did not exhibit: Callus, Crepitus, Excoriation, Fluctuance, Friable, Induration, Localized Edema, Rash, Scarring, Dry/Scaly, Maceration, Moist, Atrophie Blanche, Cyanosis, Ecchymosis, Hemosiderin Staining, Mottled, Pallor, Rubor, Erythema. Wound #4 status is Open. Original cause of wound was Trauma. The wound is located on the Left,Proximal,Lateral Lower Leg. The wound measures 6cm length x 3cm width x 0.2cm depth; 14.137cm^2 area and 2.827cm^3 volume. The wound is limited to skin breakdown. There is  no tunneling or undermining noted. There is a large amount of serous drainage noted. The wound margin is flat and intact. There is medium (34-66%) pale granulation within the wound bed. There is a medium (34-66%) amount of necrotic tissue  within the wound bed including Adherent Slough. The periwound skin appearance exhibited: Excoriation, Moist, Erythema. The periwound skin appearance did not exhibit: Callus, Crepitus, Fluctuance, Friable, Induration, Localized Edema, Rash, Scarring, Dry/Scaly, Maceration, Atrophie Blanche, Cyanosis, Ecchymosis, Hemosiderin Staining, Mottled, Pallor, Rubor. The surrounding wound skin color is noted with erythema. Wound #5 status is Open. Original cause of wound was Gradually Appeared. The wound is located on the Left,Distal,Lateral Lower Leg. The wound measures 2cm length x 1.5cm width x 0.2cm depth; 2.356cm^2 area and 0.471cm^3 volume. The wound is limited to skin breakdown. There is no tunneling or undermining noted. There is a large amount of serous drainage noted. The wound margin is flat and intact. There is no granulation within the wound bed. There is a large (67-100%) amount of necrotic tissue within the wound bed including Adherent Slough. The periwound skin appearance exhibited: Moist. The periwound skin appearance did not exhibit: Callus, Crepitus, Excoriation, Fluctuance, Friable, Induration, Localized Edema, Rash, Scarring, Dry/Scaly, Maceration, Atrophie Blanche, Cyanosis, Ecchymosis, Hemosiderin Staining, Mottled, Pallor, Rubor, Erythema. [name], [name] ([id number]) Wound #6 status is Open. Original cause of wound was Gradually Appeared. The wound is located on the Left,Anterior Lower Leg. The wound measures 1cm length x 1cm width x 0.1cm depth; 0.785cm^2 area and 0.079cm^3 volume. The wound is limited to skin breakdown. There is no tunneling or undermining noted. There is a large amount of serosanguineous drainage noted. The wound margin is flat  and intact. There is large (67-100%) granulation within the wound bed. There is a small (1-33%) amount of necrotic tissue within the wound bed including Adherent Slough. The periwound skin appearance did not exhibit: Callus, Crepitus, Excoriation, Fluctuance, Friable, Induration, Localized Edema, Rash, Scarring, Dry/Scaly, Maceration, Moist, Atrophie Blanche, Cyanosis, Ecchymosis, Hemosiderin Staining, Mottled, Pallor, Rubor, Erythema. Wound #7 status is Open. Original cause of wound was Gradually Appeared. The wound is located on the Left,Medial Lower Leg. The wound measures 1cm length x 1cm width x 0.1cm depth; 0.785cm^2 area and 0.079cm^3 volume. The wound is limited to skin breakdown. There is a large amount of serous drainage noted. The wound margin is flat and intact. There is medium (34-66%) pink granulation within the wound bed. There is a medium (34-66%) amount of necrotic tissue within the wound bed including Adherent Slough. The periwound skin appearance exhibited: Moist, Erythema. The surrounding wound skin color is noted with erythema which is circumferential. Periwound temperature was noted as No Abnormality. The periwound has tenderness on palpation. Assessment Active Problems ICD-10 G35 - Multiple sclerosis E66.01 - Morbid (severe) obesity due to excess calories L97.512 - Non-pressure chronic ulcer of other part of right foot with fat layer exposed L97.212 - Non-pressure chronic ulcer of right calf with fat layer exposed L97.222 - Non-pressure chronic ulcer of left calf with fat layer exposed F17.218 - Nicotine dependence, cigarettes, with other nicotine-induced disorders #1 I have no good feel for the cause of these wounds. They look arterial although her pedal pulses are palpable she is a smoker. If she has not had formal arterial studies she'll likely need these. None of these look like they're close to healing they do not look like venous insufficiency  ulcers. Plan Wound Cleansing: Wound #2 Right,Dorsal Foot: Cleanse wound with mild soap and water - HHRN to wash legs with soap and water No tub bath. [name], [name] ([id number]) Wound #3 Right Lower Leg: Cleanse wound with mild soap and water - HHRN to wash legs with soap and water No tub bath. Wound #4 Left,Proximal,Lateral Lower  Leg: Cleanse wound with mild soap and water - HHRN to wash legs with soap and water No tub bath. Wound #5 Left,Distal,Lateral Lower Leg: Cleanse wound with mild soap and water - HHRN to wash legs with soap and water No tub bath. Wound #6 Left,Anterior Lower Leg: Cleanse wound with mild soap and water - HHRN to wash legs with soap and water No tub bath. Wound #7 Left,Medial Lower Leg: Cleanse wound with mild soap and water - HHRN to wash legs with soap and water No tub bath. Anesthetic: Wound #2 Right,Dorsal Foot: Topical Lidocaine 4% cream applied to wound bed prior to debridement - in clinic only Wound #3 Right Lower Leg: Topical Lidocaine 4% cream applied to wound bed prior to debridement - in clinic only Wound #4 Left,Proximal,Lateral Lower Leg: Topical Lidocaine 4% cream applied to wound bed prior to debridement - in clinic only Wound #5 Left,Distal,Lateral Lower Leg: Topical Lidocaine 4% cream applied to wound bed prior to debridement - in clinic only Wound #6 Left,Anterior Lower Leg: Topical Lidocaine 4% cream applied to wound bed prior to debridement - in clinic only Wound #7 Left,Medial Lower Leg: Topical Lidocaine 4% cream applied to wound bed prior to debridement - in clinic only Primary Wound Dressing: Wound #2 Right,Dorsal Foot: Santyl Ointment Wound #3 Right Lower Leg: Santyl Ointment Wound #4 Left,Proximal,Lateral Lower Leg: Aquacel Ag Sorbalgon Ag - IN CLINIC Wound #5 Left,Distal,Lateral Lower Leg: Aquacel Ag Sorbalgon Ag - IN CLINIC Wound #6 Left,Anterior Lower Leg: Aquacel Ag Sorbalgon Ag - IN CLINIC Wound #7 Left,Medial  Lower Leg: Aquacel Ag Sorbalgon Ag - IN CLINIC Secondary Dressing: Wound #2 Right,Dorsal Foot: ABD pad Dry Gauze Wound #3 Right Lower Leg: ABD pad [name], [name] H. ([id number]) Dry Gauze Wound #4 Left,Proximal,Lateral Lower Leg: ABD pad Dry Gauze Wound #5 Left,Distal,Lateral Lower Leg: ABD pad Dry Gauze Wound #6 Left,Anterior Lower Leg: ABD pad Dry Gauze Wound #7 Left,Medial Lower Leg: ABD pad Dry Gauze Dressing Change Frequency: Wound #2 Right,Dorsal Foot: Three times weekly Wound #3 Right Lower Leg: Three times weekly Wound #4 Left,Proximal,Lateral Lower Leg: Three times weekly Wound #5 Left,Distal,Lateral Lower Leg: Three times weekly Wound #6 Left,Anterior Lower Leg: Three times weekly Wound #7 Left,Medial Lower Leg: Three times weekly Follow-up Appointments: Wound #2 Right,Dorsal Foot: Return Appointment in 1 week. Wound #3 Right Lower Leg: Return Appointment in 1 week. Wound #4 Left,Proximal,Lateral Lower Leg: Return Appointment in 1 week. Wound #5 Left,Distal,Lateral Lower Leg: Return Appointment in 1 week. Wound #6 Left,Anterior Lower Leg: Return Appointment in 1 week. Wound #7 Left,Medial Lower Leg: Return Appointment in 1 week. Edema Control: Wound #2 Right,Dorsal Foot: 3 Layer Compression System - Bilateral - UNNA TO ANCHOR Patient to wear own compression stockings - HHRN please order compression hose for patient 20- 26mmHg Elevate legs to the level of the heart and pump ankles as often as possible Wound #3 Right Lower Leg: 3 Layer Compression System - Bilateral - UNNA TO ANCHOR Patient to wear own compression stockings - HHRN please order compression hose for patient 20- 53mmHg Elevate legs to the level of the heart and pump ankles as often as possible Wound #4 Left,Proximal,Lateral Lower Leg: [name], LOUT. ([id number]) 3 Layer Compression System - Bilateral [name] TO ANCHOR Patient to wear own compression stockings - HHRN please order  compression hose for patient 20- 50mmHg Elevate legs to the level of the heart and pump ankles as often as possible Wound #5 Left,Distal,Lateral Lower Leg: 3 Layer Compression System -  Bilateral [name] TO ANCHOR Patient to wear own compression stockings - HHRN please order compression hose for patient 20- 43mmHg Elevate legs to the level of the heart and pump ankles as often as possible Wound #6 Left,Anterior Lower Leg: 3 Layer Compression System - Bilateral - UNNA TO ANCHOR Patient to wear own compression stockings - HHRN please order compression hose for patient 20- 76mmHg Elevate legs to the level of the heart and pump ankles as often as possible Wound #7 Left,Medial Lower Leg: 3 Layer Compression System - Bilateral - UNNA TO ANCHOR Patient to wear own compression stockings - HHRN please order compression hose for patient 20- 76mmHg Elevate legs to the level of the heart and pump ankles as often as possible Additional Orders / Instructions: Wound #2 Right,Dorsal Foot: Stop Smoking Increase protein intake. Wound #3 Right Lower Leg: Stop Smoking Increase protein intake. Wound #4 Left,Proximal,Lateral Lower Leg: Stop Smoking Increase protein intake. Wound #5 Left,Distal,Lateral Lower Leg: Stop Smoking Increase protein intake. Wound #6 Left,Anterior Lower Leg: Stop Smoking Increase protein intake. Wound #7 Left,Medial Lower Leg: Stop Smoking Increase protein intake. Home Health: Wound #2 Right,Dorsal Foot: [place] Visits - Advanced ********[facility] please order compression hose for patient 20- 56mmHg********* Home Health Nurse may visit PRN to address patient s wound care needs. FACE TO FACE ENCOUNTER: MEDICARE and MEDICAID PATIENTS: I certify that this patient is under my care and that I had a face-to-face encounter that meets the physician face-to-face encounter requirements with this patient on this date. The encounter with the patient was in whole or in part  for the following MEDICAL CONDITION: (primary reason for [place]) MEDICAL NECESSITY: I certify, that based on my findings, NURSING services are a medically necessary home health service. HOME BOUND STATUS: I certify that my clinical findings support that this patient is homebound (i.e., Due to illness or injury, pt requires aid of supportive devices such as crutches, cane, wheelchairs, walkers, the use of special transportation or the assistance of another person to leave their place of residence. There is a Foell, Amyia H. ([id number]) normal inability to leave the home and doing so requires considerable and taxing effort. Other absences are for medical reasons / religious services and are infrequent or of short duration when for other reasons). If current dressing causes regression in wound condition, may D/C ordered dressing product/s and apply Normal Saline Moist Dressing daily until next [place] / Other MD appointment. [place] of regression in wound condition at [phone]. Please direct any NON-WOUND related issues/requests for orders to patient's Primary Care Physician Wound #3 Right Lower Leg: [place] Visits - Advanced *********[facility] please order compression hose for patient 20- 11mmHg********* Home Health Nurse may visit PRN to address patient s wound care needs. FACE TO FACE ENCOUNTER: MEDICARE and MEDICAID PATIENTS: I certify that this patient is under my care and that I had a face-to-face encounter that meets the physician face-to-face encounter requirements with this patient on this date. The encounter with the patient was in whole or in part for the following MEDICAL CONDITION: (primary reason for [place]) MEDICAL NECESSITY: I certify, that based on my findings, NURSING services are a medically necessary home health service. HOME BOUND STATUS: I certify that my clinical findings support that this patient is homebound  (i.e., Due to illness or injury, pt requires aid of supportive devices such as crutches, cane, wheelchairs, walkers, the use of special transportation or the assistance of another person  to leave their place of residence. There is a normal inability to leave the home and doing so requires considerable and taxing effort. Other absences are for medical reasons / religious services and are infrequent or of short duration when for other reasons). If current dressing causes regression in wound condition, may D/C ordered dressing product/s and apply Normal Saline Moist Dressing daily until next [place] / Other MD appointment. [place] of regression in wound condition at [phone]. Please direct any NON-WOUND related issues/requests for orders to patient's Primary Care Physician Wound #4 Left,Proximal,Lateral Lower Leg: [place] Visits - Advanced *********[facility] please order compression hose for patient 20- 24mmHg********* Home Health Nurse may visit PRN to address patient s wound care needs. FACE TO FACE ENCOUNTER: MEDICARE and MEDICAID PATIENTS: I certify that this patient is under my care and that I had a face-to-face encounter that meets the physician face-to-face encounter requirements with this patient on this date. The encounter with the patient was in whole or in part for the following MEDICAL CONDITION: (primary reason for [place]) MEDICAL NECESSITY: I certify, that based on my findings, NURSING services are a medically necessary home health service. HOME BOUND STATUS: I certify that my clinical findings support that this patient is homebound (i.e., Due to illness or injury, pt requires aid of supportive devices such as crutches, cane, wheelchairs, walkers, the use of special transportation or the assistance of another person to leave their place of residence. There is a normal inability to leave the home and doing so requires considerable  and taxing effort. Other absences are for medical reasons / religious services and are infrequent or of short duration when for other reasons). If current dressing causes regression in wound condition, may D/C ordered dressing product/s and apply Normal Saline Moist Dressing daily until next [place] / Other MD appointment. [place] of regression in wound condition at [phone]. Please direct any NON-WOUND related issues/requests for orders to patient's Primary Care Physician Wound #5 Left,Distal,Lateral Lower Leg: [place] Visits - Advanced *********[facility] please order compression hose for patient 20- 106mmHg********* Home Health Nurse may visit PRN to address patient s wound care needs. FACE TO FACE ENCOUNTER: MEDICARE and MEDICAID PATIENTS: I certify that this patient is under my care and that I had a face-to-face encounter that meets the physician face-to-face encounter requirements with this patient on this date. The encounter with the patient was in whole or in part for the following MEDICAL CONDITION: (primary reason for [place]) MEDICAL NECESSITY: I certify, that based on my findings, NURSING services are a medically necessary home health service. HOME [place], [place] ([id number]) BOUND STATUS: I certify that my clinical findings support that this patient is homebound (i.e., Due to illness or injury, pt requires aid of supportive devices such as crutches, cane, wheelchairs, walkers, the use of special transportation or the assistance of another person to leave their place of residence. There is a normal inability to leave the home and doing so requires considerable and taxing effort. Other absences are for medical reasons / religious services and are infrequent or of short duration when for other reasons). If current dressing causes regression in wound condition, may D/C ordered dressing product/s and apply Normal Saline Moist Dressing  daily until next [place] / Other MD appointment. [place] of regression in wound condition at [phone]. Please direct any NON-WOUND related issues/requests for orders to patient's Primary Care Physician  Wound #6 Left,Anterior Lower Leg: Continue Home Health Visits - Advanced *********[facility] please order compression hose for patient 20- 36mmHg********* Home Health Nurse may visit PRN to address patient s wound care needs. FACE TO FACE ENCOUNTER: MEDICARE and MEDICAID PATIENTS: I certify that this patient is under my care and that I had a face-to-face encounter that meets the physician face-to-face encounter requirements with this patient on this date. The encounter with the patient was in whole or in part for the following MEDICAL CONDITION: (primary reason for [place]) MEDICAL NECESSITY: I certify, that based on my findings, NURSING services are a medically necessary home health service. HOME BOUND STATUS: I certify that my clinical findings support that this patient is homebound (i.e., Due to illness or injury, pt requires aid of supportive devices such as crutches, cane, wheelchairs, walkers, the use of special transportation or the assistance of another person to leave their place of residence. There is a normal inability to leave the home and doing so requires considerable and taxing effort. Other absences are for medical reasons / religious services and are infrequent or of short duration when for other reasons). If current dressing causes regression in wound condition, may D/C ordered dressing product/s and apply Normal Saline Moist Dressing daily until next [place] / Other MD appointment. [place] of regression in wound condition at [phone]. Please direct any NON-WOUND related issues/requests for orders to patient's Primary Care Physician Wound #7 Left,Medial Lower Leg: [place] Visits - Advanced  ********[facility] please order compression hose for patient 20- 38mmHg********* Home Health Nurse may visit PRN to address patient s wound care needs. FACE TO FACE ENCOUNTER: MEDICARE and MEDICAID PATIENTS: I certify that this patient is under my care and that I had a face-to-face encounter that meets the physician face-to-face encounter requirements with this patient on this date. The encounter with the patient was in whole or in part for the following MEDICAL CONDITION: (primary reason for [place]) MEDICAL NECESSITY: I certify, that based on my findings, NURSING services are a medically necessary home health service. HOME BOUND STATUS: I certify that my clinical findings support that this patient is homebound (i.e., Due to illness or injury, pt requires aid of supportive devices such as crutches, cane, wheelchairs, walkers, the use of special transportation or the assistance of another person to leave their place of residence. There is a normal inability to leave the home and doing so requires considerable and taxing effort. Other absences are for medical reasons / religious services and are infrequent or of short duration when for other reasons). If current dressing causes regression in wound condition, may D/C ordered dressing product/s and apply Normal Saline Moist Dressing daily until next [place] / Other MD appointment. [place] of regression in wound condition at [phone]. Please direct any NON-WOUND related issues/requests for orders to patient's Primary Care Physician Medications-please add to medication list.: Wound #4 Left,Proximal,Lateral Lower Leg: P.O. Antibiotics - Doxycycline Santyl Enzymatic Ointment Wound #5 Left,Distal,Lateral Lower Leg: P.O. Antibiotics - Doxycycline Belcher, [name] H. ([id number]) Santyl Enzymatic Ointment Wound #6 Left,Anterior Lower Leg: P.O. Antibiotics - Doxycycline Santyl Enzymatic Ointment Wound #7 Left,Medial  Lower Leg: P.O. Antibiotics - Doxycycline Santyl Enzymatic Ointment The following medication(s) was prescribed: doxycycline hyclate oral 100 mg capsule 1 1 capsule oral #1 I continue Santyl to the right dorsal foot #2 Aquacel Ag to the areas on the left leg #3 we wrapped her in Profore lites bilaterally although  retrospectively I wonder whether this may be excessive #4 she has home health changing the dressings. #5 the patient has been in the clinic before with wounds that healed as far as I can see with just compression. Again I'll need to have a complete review of this situation. It is likely she will need formal arterial studies Electronic Signature(s) Signed: 02/14/2016 8:01:59 AM By: Linton Ham MD Entered By: Linton Ham on 02/14/2016 07:51:43 [name] -------------------------------------------------------------------------------- SuperBill Details Patient Name: [name] Date of Service: 02/13/2016 Medical Record Patient Account Number: [id number] Number: Treating RN: Ahmed Prima Dec 29, 1950 (65 y.o. 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 Physician Procedures CPT4 Code  Description: [id number] - WC PHYS LEVEL 4 - EST PT ICD-10 Description Diagnosis L97.512 Non-pressure chronic ulcer of other part of right fo L97.222 Non-pressure chronic ulcer of left calf with fat lay Modifier: ot with fat lay er exposed Quantity: 1 er exposed Electronic Signature(s) Signed: 02/14/2016 8:01:59 AM By: Linton Ham MD Previous Signature: 02/13/2016 5:14:55 PM Version By: Alric Quan Entered By: Linton Ham on 02/14/2016 07:53:25

## 2016-02-14 NOTE — Progress Notes (Addendum)
[name], [name] ([id number]) Visit Report for 02/13/2016 Arrival Information Details Patient Name: [name] [name] Date of Service: 02/13/2016 1:30 PM Medical Record Patient Account Number: [id number] Number: Treating RN: Ahmed Prima Jul 08, 1950 (65 y.o. Other Clinician: Date of Birth/Sex: Female) Treating ROBSON, [place] Primary Care Physician: LADA, MELINDA Physician/Extender: G Referring Physician: LADA, MELINDA Weeks in Treatment: 12 Visit Information History Since Last Visit All ordered tests and consults were completed: No Patient Arrived: Wheel Chair Added or deleted any medications: No Arrival Time: 13:52 Any new allergies or adverse reactions: No Accompanied By: SELF Had a fall or experienced change in No Transfer Assistance: [employer] activities of daily living that may affect Patient Lift risk of falls: Patient Identification Verified: Yes Signs or symptoms of abuse/neglect since last No Secondary Verification Process Yes visito Completed: Hospitalized since last visit: No Patient Requires Transmission- No Pain Present Now: No Based Precautions: Patient Has Alerts: Yes Electronic Signature(s) Signed: 02/13/2016 5:14:55 PM By: Alric Quan Entered By: Alric Quan on 02/13/2016 13:57:18 [name] -------------------------------------------------------------------------------- Encounter Discharge Information Details Patient Name: [name] Date of Service: 02/13/2016 1:30 PM Medical Record Patient Account Number: [id number] Number: Treating RN: Ahmed Prima Sep 02, 1950 (65 y.o. Other Clinician: Date of Birth/Sex: Female) Treating ROBSON, [place] Primary Care Physician: LADA, MELINDA Physician/Extender: G Referring Physician: LADA, MELINDA Weeks in Treatment: 12 Encounter Discharge Information Items Discharge Pain Level: 0 Discharge Condition: Stable Ambulatory Status: Wheelchair Discharge  Destination: Home Transportation: Private Auto Accompanied By: husband Schedule Follow-up Appointment: Yes Medication Reconciliation completed and provided to Patient/Care Yes Aaleah Hirsch: Provided on Clinical Summary of Care: 02/13/2016 Form Type Recipient Paper Patient BW Electronic Signature(s) Signed: 02/13/2016 2:57:32 PM By: Ruthine Dose Entered By: Ruthine Dose on 02/13/2016 14:57:32 [name] -------------------------------------------------------------------------------- Lower Extremity Assessment Details Patient Name: [name] Date of Service: 02/13/2016 1:30 PM Medical Record Patient Account Number: [id number] Number: Treating RN: Ahmed Prima 1950-05-18 (65 y.o. Other Clinician: Date of Birth/Sex: Female) Treating ROBSON, MICHAEL Primary Care Physician: LADA, MELINDA Physician/Extender: G Referring Physician: LADA, MELINDA Weeks in Treatment: 12 Vascular Assessment Pulses: Posterior Tibial Dorsalis Pedis Palpable: [Left:Yes] [Right:Yes] Extremity colors, hair growth, and conditions: Extremity Color: [Left:Red] [Right:Normal] Temperature of Extremity: [Left:Warm] [Right:Warm] Capillary Refill: [Left:< 3 seconds] [Right:< 3 seconds] Toe Nail Assessment Left: Right: Thick: No No Discolored: No No Deformed: No No Improper Length and Hygiene: Yes Yes Electronic Signature(s) Signed: 02/13/2016 5:14:55 PM By: Alric Quan Entered By: Alric Quan on 02/13/2016 14:25:19 [name] [name] -------------------------------------------------------------------------------- Multi Wound Chart Details Patient Name: [name] Date of Service: 02/13/2016 1:30 PM Medical Record Patient Account Number: [id number] Number: Treating RN: Ahmed Prima January 27, 1951 (65 y.o. Other Clinician: Date of Birth/Sex: Female) Treating ROBSON, MICHAEL Primary Care Physician: LADA, MELINDA Physician/Extender:  G Referring Physician: LADA, MELINDA Weeks in Treatment: 12 Vital Signs Height(in): 63 Pulse(bpm): 70 Weight(lbs): 270 Blood Pressure 137/65 (mmHg): Body Mass Index(BMI): 48 Temperature(F): 98.7 Respiratory Rate 20 (breaths/min): Photos: [2:No Photos] [3:No Photos] [4:No Photos] Wound Location: [2:Right Foot - Dorsal] [3:Right Lower Leg] [4:Left Lower Leg - Lateral, Proximal] Wounding Event: [2:Trauma] [3:Trauma] [4:Trauma] Primary Etiology: [2:Trauma, Other] [3:Trauma, Other] [4:Venous Leg Ulcer] Comorbid History: [2:Cataracts, Chronic Obstructive Pulmonary Disease (COPD), Sleep Disease (COPD), Sleep Disease (COPD), Sleep Apnea, Hypertension, Osteoarthritis, Neuropathy Osteoarthritis, Neuropathy Osteoarthritis, Neuropathy] [3:Cataracts, Chronic  Obstructive Pulmonary Apnea, Hypertension,] [4:Cataracts, Chronic Obstructive Pulmonary Apnea, Hypertension,] Date Acquired: [2:07/21/2015] [3:10/09/2015] [4:10/09/2015] Weeks of Treatment: [2:12] [3:12] [4:12] Wound Status: [2:Open] [3:Open] [4:Open] Clustered Wound: [2:No] [  3:No] [4:Yes] Measurements L x W x D 0.3x0.5x0.1 [3:1x1.5x0.2] [4:6x3x0.2] (cm) Area (cm) : [2:0.118] [3:1.178] [4:14.137] Volume (cm) : [2:0.012] [3:0.236] [4:2.827] % Reduction in Area: [2:70.00%] [3:-138.00%] [4:-14939.40%] % Reduction in Volume: 84.80% [3:-59.50%] [4:-31311.10%] Classification: [2:Partial Thickness] [3:Partial Thickness] [4:Partial Thickness] Exudate Amount: [2:Medium] [3:Large] [4:Large] Exudate Type: [2:Serous] [3:Serous] [4:Serous] Exudate Color: [2:amber] [3:amber] [4:amber] Wound Margin: [2:Flat and Intact] [3:Flat and Intact] [4:Flat and Intact] Granulation Amount: [2:None Present (0%)] [3:None Present (0%)] [4:Medium (34-66%)] Granulation Quality: [2:N/A] [3:N/A] [4:Pale] Necrotic Amount: [2:Large (67-100%)] [3:Large (67-100%)] [4:Medium (34-66%)] Necrotic Tissue: [place] Exposed Structures: Fascia:  No Fascia: No Fascia: No Fat: No Fat: No Fat: No Tendon: No Tendon: No Tendon: No Muscle: No Muscle: No Muscle: No Joint: No Joint: No Joint: No Bone: No Bone: No Bone: No Limited to Skin Limited to Skin Limited to Skin Breakdown Breakdown Breakdown Epithelialization: None None None Periwound Skin Texture: Edema: Yes Edema: No Excoriation: Yes Excoriation: No Excoriation: No Edema: No Induration: No Induration: No Induration: No Callus: No Callus: No Callus: No Crepitus: No Crepitus: No Crepitus: No Fluctuance: No Fluctuance: No Fluctuance: No Friable: No Friable: No Friable: No Rash: No Rash: No Rash: No Scarring: No Scarring: No Scarring: No Periwound Skin Maceration: No Maceration: No Moist: Yes Moisture: Moist: No Moist: No Maceration: No Dry/Scaly: No Dry/Scaly: No Dry/Scaly: No Periwound Skin Color: Ecchymosis: Yes Atrophie Blanche: No Erythema: Yes Atrophie Blanche: No Cyanosis: No Atrophie Blanche: No Cyanosis: No Ecchymosis: No Cyanosis: No Erythema: No Erythema: No Ecchymosis: No Hemosiderin Staining: No Hemosiderin Staining: No Hemosiderin Staining: No Mottled: No Mottled: No Mottled: No Pallor: No Pallor: No Pallor: No Rubor: No Rubor: No Rubor: No Erythema Location: N/A N/A N/A Temperature: N/A N/A N/A Tenderness on No No No Palpation: Wound Preparation: Ulcer Cleansing: Ulcer Cleansing: Ulcer Cleansing: Rinsed/Irrigated with Rinsed/Irrigated with Rinsed/Irrigated with Saline Saline Saline Topical Anesthetic Topical Anesthetic Topical Anesthetic Applied: Other: lidocaine Applied: Other: lidocaine Applied: Other: lidocaine 4% 4% 4% Wound Number: 5 6 7  Photos: No Photos No Photos No Photos Wound Location: Left Lower Leg - Lateral, Left Lower Leg - Anterior Left, Medial Lower Leg Distal Wounding Event: Gradually Appeared Gradually Appeared Gradually Appeared Primary Etiology: Venous Leg Ulcer Venous Leg Ulcer To  be determined Comorbid History: Cataracts, Chronic Cataracts, Chronic Cataracts, Chronic Obstructive Pulmonary Obstructive Pulmonary Obstructive Pulmonary [name], [name] H. ([id number]) Disease (COPD), Sleep Disease (COPD), Sleep Disease (COPD), Sleep Apnea, Hypertension, Apnea, Hypertension, Apnea, Hypertension, Osteoarthritis, Neuropathy Osteoarthritis, Neuropathy Osteoarthritis, Neuropathy Date Acquired: 12/21/2015 12/21/2015 02/06/2016 Weeks of Treatment: 7 7 1  Wound Status: Open Open Open Clustered Wound: No No No Measurements L x W x D 2x1.5x0.2 1x1x0.1 1x1x0.1 (cm) Area (cm) : 2.356 0.785 0.785 Volume (cm) : 0.471 0.079 0.079 % Reduction in Area: -328.40% -78.40% 52.40% % Reduction in Volume: -756.40% -79.50% 52.10% Classification: Partial Thickness Partial Thickness Partial Thickness Exudate Amount: Large Large Large Exudate Type: Serous Serosanguineous Serous Exudate Color: amber red, brown amber Wound Margin: Flat and Intact Flat and Intact Flat and Intact Granulation Amount: None Present (0%) Large (67-100%) Medium (34-66%) Granulation Quality: N/A N/A Pink Necrotic Amount: Large (67-100%) Small (1-33%) Medium (34-66%) Necrotic Tissue: Adherent [place] Exposed Structures: Fascia: No Fascia: No Fascia: No Fat: No Fat: No Fat: No Tendon: No Tendon: No Tendon: No Muscle: No Muscle: No Muscle: No Joint: No Joint: No Joint: No Bone: No Bone: No Bone: No Limited to Skin Limited to Skin Limited to Skin Breakdown Breakdown Breakdown  Epithelialization: None None None Periwound Skin Texture: Edema: No Edema: No No Abnormalities Noted Excoriation: No Excoriation: No Induration: No Induration: No Callus: No Callus: No Crepitus: No Crepitus: No Fluctuance: No Fluctuance: No Friable: No Friable: No Rash: No Rash: No Scarring: No Scarring: No Periwound Skin Moist: Yes Maceration: No Moist: Yes Moisture: Maceration:  No Moist: No Dry/Scaly: No Dry/Scaly: No Periwound Skin Color: Atrophie Blanche: No Atrophie Blanche: No Erythema: Yes Cyanosis: No Cyanosis: No Ecchymosis: No Ecchymosis: No Erythema: No Erythema: No Hemosiderin Staining: No Hemosiderin Staining: No Mottled: No Mottled: No [name], [name] H. ([id number]) Pallor: No Pallor: No Rubor: No Rubor: No Erythema Location: N/A N/A Circumferential Temperature: N/A N/A No Abnormality Tenderness on No No Yes Palpation: Wound Preparation: Ulcer Cleansing: Ulcer Cleansing: Ulcer Cleansing: Rinsed/Irrigated with Rinsed/Irrigated with Rinsed/Irrigated with Saline Saline Saline Topical Anesthetic Topical Anesthetic Topical Anesthetic Applied: Other: lidocaine Applied: Other: lidocaine Applied: Other: lidocaine 4% 4% 4% Treatment Notes Electronic Signature(s) Signed: 02/13/2016 5:14:55 PM By: Alric Quan Entered By: Alric Quan on 02/13/2016 14:18:18 [name] ([id number]) -------------------------------------------------------------------------------- [place] Details Patient Name: [name] Date of Service: 02/13/2016 1:30 PM Medical Record Patient Account Number: [id number] Number: Treating RN: Ahmed Prima [birth year] (65 y.o. Other Clinician: Date of Birth/Sex: Female) Treating ROBSON, [place] Primary Care Physician: LADA, MELINDA Physician/Extender: G Referring Physician: LADA, MELINDA Weeks in Treatment: 12 Active Inactive Abuse / Safety / Falls / Self Care Management Nursing Diagnoses: Impaired physical mobility Potential for falls Goals: Patient will remain injury free Date Initiated: 11/20/2015 Goal Status: Active Interventions: Assess fall risk on admission and as needed Notes: Nutrition Nursing Diagnoses: Imbalanced nutrition Goals: Patient/caregiver agrees to and verbalizes understanding of need to use nutritional supplements and/or vitamins as  prescribed Date Initiated: 11/20/2015 Goal Status: Active Interventions: Provide education on nutrition Notes: Orientation to the Wound Care Program Nursing Diagnoses: Knowledge deficit related to the wound healing center program [name] [name] ([id number]) Goals: Patient/caregiver will verbalize understanding of the [place] Date Initiated: 11/20/2015 Goal Status: Active Interventions: Provide education on orientation to the wound center Notes: Wound/Skin Impairment Nursing Diagnoses: Impaired tissue integrity Goals: Patient will demonstrate a reduced rate of smoking or cessation of smoking Date Initiated: 11/20/2015 Goal Status: Active Ulcer/skin breakdown will heal within 14 weeks Date Initiated: 11/20/2015 Goal Status: Active Interventions: Assess patient/caregiver ability to obtain necessary supplies Provide education on smoking Notes: Electronic Signature(s) Signed: 02/13/2016 5:14:55 PM By: Alric Quan Entered By: Alric Quan on 02/13/2016 14:18:11 [name] ([id number]) -------------------------------------------------------------------------------- Pain Assessment Details Patient Name: [name] Date of Service: 02/13/2016 1:30 PM Medical Record Patient Account Number: [id number] Number: Treating RN: Ahmed Prima [birth year] (65 y.o. Other Clinician: Date of Birth/Sex: Female) Treating ROBSON, [place] Primary Care Physician: LADA, MELINDA Physician/Extender: G Referring Physician: LADA, MELINDA Weeks in Treatment: 12 Active Problems Location of Pain Severity and Description of Pain Patient Has Paino No Site Locations With Dressing Change: No Pain Management and Medication Current Pain Management: Electronic Signature(s) Signed: 02/13/2016 5:14:55 PM By: Alric Quan Entered By: Alric Quan on 02/13/2016 13:57:23 [name]  ([id number]) -------------------------------------------------------------------------------- Patient/Caregiver Education Details Patient Name: [name] Date of Service: 02/13/2016 1:30 PM Medical Record Patient Account Number: [id number] Number: Treating RN: Ahmed Prima November 08, 1950 (65 y.o. Other Clinician: Date of Birth/Gender: Female) Treating ROBSON, MICHAEL Primary Care Physician: LADA, MELINDA Physician/Extender: G Referring Physician: LADA, MELINDA Weeks in Treatment: 12 Education Assessment Education Provided To: Patient Education Topics Provided Wound/Skin Impairment:  Handouts: Other: change dressing as ordered Methods: Demonstration, Explain/Verbal Responses: State content correctly Electronic Signature(s) Signed: 02/13/2016 5:14:55 PM By: Alric Quan Entered By: Alric Quan on 02/13/2016 14:20:35 [name] -------------------------------------------------------------------------------- Wound Assessment Details Patient Name: [name] Date of Service: 02/13/2016 1:30 PM Medical Record Patient Account Number: [id number] Number: Treating RN: Ahmed Prima 12/10/1950 (65 y.o. Other Clinician: Date of Birth/Sex: Female) Treating ROBSON, MICHAEL Primary Care Physician: LADA, MELINDA Physician/Extender: G Referring Physician: LADA, MELINDA Weeks in Treatment: 12 Wound Status Wound Number: 2 Primary Trauma, Other Etiology: Wound Location: Right Foot - Dorsal Wound Open Wounding Event: Trauma Status: Date Acquired: 07/21/2015 Comorbid Cataracts, Chronic Obstructive Weeks Of Treatment: 12 History: Pulmonary Disease (COPD), Sleep Clustered Wound: No Apnea, Hypertension, Osteoarthritis, Neuropathy Photos Photo Uploaded By: Alric Quan on 02/13/2016 16:42:50 Wound Measurements Length: (cm) 0.3 Width: (cm) 0.5 Depth: (cm) 0.1 Area: (cm) 0.118 Volume: (cm) 0.012 % Reduction in Area: 70% %  Reduction in Volume: 84.8% Epithelialization: None Tunneling: No Undermining: No Wound Description Classification: Partial Thickness Wound Margin: Flat and Intact Exudate Amount: Medium Exudate Type: Serous Exudate Color: amber Wound Bed Granulation Amount: None Present (0%) Exposed Structure [place], Nishika H. ([id number]) Necrotic Amount: Large (67-100%) Fascia Exposed: No Necrotic Quality: Eschar Fat Layer Exposed: No Tendon Exposed: No Muscle Exposed: No Joint Exposed: No Bone Exposed: No Limited to Skin Breakdown Periwound Skin Texture Texture Color No Abnormalities Noted: No No Abnormalities Noted: No Callus: No Atrophie Blanche: No Crepitus: No Cyanosis: No Excoriation: No Ecchymosis: Yes Fluctuance: No Erythema: No Friable: No Hemosiderin Staining: No Induration: No Mottled: No Localized Edema: Yes Pallor: No Rash: No Rubor: No Scarring: No Moisture No Abnormalities Noted: No Dry / Scaly: No Maceration: No Moist: No Wound Preparation Ulcer Cleansing: Rinsed/Irrigated with Saline Topical Anesthetic Applied: Other: lidocaine 4%, Treatment Notes Wound #2 (Right, Dorsal Foot) 1. Cleansed with: Clean wound with Normal Saline Cleanse wound with antibacterial soap and water 2. Anesthetic Topical Lidocaine 4% cream to wound bed prior to debridement 4. Dressing Applied: Santyl Ointment 5. Secondary Dressing Applied ABD Pad Dry Gauze 7. Secured with Tape 3 Layer Compression System - Left Lower Extremity Notes WHITNI, MCWHIRT ([id number]) unna to anchor Electronic Signature(s) Signed: 02/13/2016 5:14:55 PM By: Alric Quan Entered By: Alric Quan on 02/13/2016 14:04:47 [name] -------------------------------------------------------------------------------- Wound Assessment Details Patient Name: [name] Date of Service: 02/13/2016 1:30 PM Medical Record Patient Account Number:  [id number] Number: Treating RN: Ahmed Prima 20-Aug-1950 (65 y.o. Other Clinician: Date of Birth/Sex: Female) Treating ROBSON, [place] Primary Care Physician: LADA, MELINDA Physician/Extender: G Referring Physician: LADA, MELINDA Weeks in Treatment: 12 Wound Status Wound Number: 3 Primary Trauma, Other Etiology: Wound Location: Right Lower Leg Wound Open Wounding Event: Trauma Status: Date Acquired: 10/09/2015 Comorbid Cataracts, Chronic Obstructive Weeks Of Treatment: 12 History: Pulmonary Disease (COPD), Sleep Clustered Wound: No Apnea, Hypertension, Osteoarthritis, Neuropathy Photos Photo Uploaded By: Alric Quan on 02/13/2016 16:43:13 Wound Measurements Length: (cm) 1 Width: (cm) 1.5 Depth: (cm) 0.2 Area: (cm) 1.178 Volume: (cm) 0.236 % Reduction in Area: -138% % Reduction in Volume: -59.5% Epithelialization: None Tunneling: No Undermining: No Wound Description Classification: Partial Thickness Wound Margin: Flat and Intact Exudate Amount: Large Exudate Type: Serous Exudate Color: amber Wound Bed Granulation Amount: None Present (0%) Exposed Structure Bhargava, Riniyah H. ([id number]) Necrotic Amount: Large (67-100%) Fascia Exposed: No Necrotic Quality: Adherent Slough Fat Layer Exposed: No Tendon Exposed: No Muscle Exposed: No Joint Exposed: No Bone Exposed: No Limited to  Skin Breakdown Periwound Skin Texture Texture Color No Abnormalities Noted: No No Abnormalities Noted: No Callus: No Atrophie Blanche: No Crepitus: No Cyanosis: No Excoriation: No Ecchymosis: No Fluctuance: No Erythema: No Friable: No Hemosiderin Staining: No Induration: No Mottled: No Localized Edema: No Pallor: No Rash: No Rubor: No Scarring: No Moisture No Abnormalities Noted: No Dry / Scaly: No Maceration: No Moist: No Wound Preparation Ulcer Cleansing: Rinsed/Irrigated with Saline Topical Anesthetic Applied: Other: lidocaine 4%, Treatment  Notes Wound #3 (Right Lower Leg) 1. Cleansed with: Clean wound with Normal Saline Cleanse wound with antibacterial soap and water 2. Anesthetic Topical Lidocaine 4% cream to wound bed prior to debridement 4. Dressing Applied: Santyl Ointment 5. Secondary Dressing Applied ABD Pad Dry Gauze 7. Secured with Tape 3 Layer Compression System - Left Lower Extremity Notes EMMER, LOWMASTER ([id number]) unna to anchor Electronic Signature(s) Signed: 02/13/2016 5:14:55 PM By: Alric Quan Entered By: Alric Quan on 02/13/2016 14:04:10 [name] ([id number]) -------------------------------------------------------------------------------- Wound Assessment Details Patient Name: [name] Date of Service: 02/13/2016 1:30 PM Medical Record Patient Account Number: [id number] Number: Treating RN: Ahmed Prima 1950-11-09 (65 y.o. Other Clinician: Date of Birth/Sex: Female) Treating ROBSON, MICHAEL Primary Care Physician: LADA, MELINDA Physician/Extender: G Referring Physician: LADA, MELINDA Weeks in Treatment: 12 Wound Status Wound Number: 4 Primary Venous Leg Ulcer Etiology: Wound Location: Left Lower Leg - Lateral, Proximal Wound Open Status: Wounding Event: Trauma Comorbid Cataracts, Chronic Obstructive Date Acquired: 10/09/2015 History: Pulmonary Disease (COPD), Sleep Weeks Of Treatment: 12 Apnea, Hypertension, Osteoarthritis, Clustered Wound: Yes Neuropathy Photos Photo Uploaded By: Alric Quan on 02/13/2016 16:44:13 Wound Measurements Length: (cm) 6 Width: (cm) 3 Depth: (cm) 0.2 Area: (cm) 14.137 Volume: (cm) 2.827 % Reduction in Area: -14939.4% % Reduction in Volume: -31311.1% Epithelialization: None Tunneling: No Undermining: No Wound Description Classification: Partial Thickness Wound Margin: Flat and Intact Exudate Amount: Large Exudate Type: Serous Exudate Color: amber Wound Bed Granulation Amount: Medium (34-66%)  Exposed Structure [name], [name] H. ([id number]) Granulation Quality: Pale Fascia Exposed: No Necrotic Amount: Medium (34-66%) Fat Layer Exposed: No Necrotic Quality: Adherent Slough Tendon Exposed: No Muscle Exposed: No Joint Exposed: No Bone Exposed: No Limited to Skin Breakdown Periwound Skin Texture Texture Color No Abnormalities Noted: No No Abnormalities Noted: No Callus: No Atrophie Blanche: No Crepitus: No Cyanosis: No Excoriation: Yes Ecchymosis: No Fluctuance: No Erythema: Yes Friable: No Hemosiderin Staining: No Induration: No Mottled: No Localized Edema: No Pallor: No Rash: No Rubor: No Scarring: No Moisture No Abnormalities Noted: No Dry / Scaly: No Maceration: No Moist: Yes Wound Preparation Ulcer Cleansing: Rinsed/Irrigated with Saline Topical Anesthetic Applied: Other: lidocaine 4%, Treatment Notes Wound #4 (Left, Proximal, Lateral Lower Leg) 1. Cleansed with: Clean wound with Normal Saline Cleanse wound with antibacterial soap and water 2. Anesthetic Topical Lidocaine 4% cream to wound bed prior to debridement 5. Secondary Dressing Applied ABD Pad Dry Gauze 7. Secured with Tape 3 Layer Compression System - Bilateral Notes unna to anchor ABIE, GROH ([id number]) Electronic Signature(s) Signed: 02/13/2016 5:14:55 PM By: Alric Quan Entered By: Alric Quan on 02/13/2016 14:16:07 [name] ([id number]) -------------------------------------------------------------------------------- Wound Assessment Details Patient Name: [name] Date of Service: 02/13/2016 1:30 PM Medical Record Patient Account Number: [id number] Number: Treating RN: Ahmed Prima 1951-04-10 (65 y.o. Other Clinician: Date of Birth/Sex: Female) Treating ROBSON, MICHAEL Primary Care Physician: LADA, MELINDA Physician/Extender: G Referring Physician: LADA, MELINDA Weeks in Treatment: 12 Wound Status Wound Number: 5 Primary Venous  Leg Ulcer Etiology: Wound Location:  Left Lower Leg - Lateral, Distal Wound Open Wounding Event: Gradually Appeared Status: Date Acquired: 12/21/2015 Comorbid Cataracts, Chronic Obstructive Weeks Of Treatment: 7 History: Pulmonary Disease (COPD), Sleep Clustered Wound: No Apnea, Hypertension, Osteoarthritis, Neuropathy Photos Photo Uploaded By: Alric Quan on 02/13/2016 16:44:33 Wound Measurements Length: (cm) 2 Width: (cm) 1.5 Depth: (cm) 0.2 Area: (cm) 2.356 Volume: (cm) 0.471 % Reduction in Area: -328.4% % Reduction in Volume: -756.4% Epithelialization: None Tunneling: No Undermining: No Wound Description Classification: Partial Thickness Wound Margin: Flat and Intact Exudate Amount: Large Exudate Type: Serous Exudate Color: amber Wound Bed Granulation Amount: None Present (0%) Exposed Structure [name], [name] H. ([id number]) Necrotic Amount: Large (67-100%) Fascia Exposed: No Necrotic Quality: Adherent Slough Fat Layer Exposed: No Tendon Exposed: No Muscle Exposed: No Joint Exposed: No Bone Exposed: No Limited to Skin Breakdown Periwound Skin Texture Texture Color No Abnormalities Noted: No No Abnormalities Noted: No Callus: No Atrophie Blanche: No Crepitus: No Cyanosis: No Excoriation: No Ecchymosis: No Fluctuance: No Erythema: No Friable: No Hemosiderin Staining: No Induration: No Mottled: No Localized Edema: No Pallor: No Rash: No Rubor: No Scarring: No Moisture No Abnormalities Noted: No Dry / Scaly: No Maceration: No Moist: Yes Wound Preparation Ulcer Cleansing: Rinsed/Irrigated with Saline Topical Anesthetic Applied: Other: lidocaine 4%, Treatment Notes Wound #5 (Left, Distal, Lateral Lower Leg) 1. Cleansed with: Clean wound with Normal Saline Cleanse wound with antibacterial soap and water 2. Anesthetic Topical Lidocaine 4% cream to wound bed prior to debridement 5. Secondary Dressing Applied ABD Pad Dry Gauze 7.  Secured with Tape 3 Layer Compression System - Bilateral Notes unna to anchor NETANYA, SORRENTI ([id number]) Electronic Signature(s) Signed: 02/13/2016 5:14:55 PM By: Alric Quan Entered By: Alric Quan on 02/13/2016 14:16:25 [name] ([id number]) -------------------------------------------------------------------------------- Wound Assessment Details Patient Name: [name] Date of Service: 02/13/2016 1:30 PM Medical Record Patient Account Number: [id number] Number: Treating RN: Ahmed Prima 05/13/1950 (65 y.o. Other Clinician: Date of Birth/Sex: Female) Treating ROBSON, MICHAEL Primary Care Physician: LADA, MELINDA Physician/Extender: G Referring Physician: LADA, MELINDA Weeks in Treatment: 12 Wound Status Wound Number: 6 Primary Venous Leg Ulcer Etiology: Wound Location: Left Lower Leg - Anterior Wound Open Wounding Event: Gradually Appeared Status: Date Acquired: 12/21/2015 Comorbid Cataracts, Chronic Obstructive Weeks Of Treatment: 7 History: Pulmonary Disease (COPD), Sleep Clustered Wound: No Apnea, Hypertension, Osteoarthritis, Neuropathy Photos Photo Uploaded By: Alric Quan on 02/13/2016 16:44:48 Wound Measurements Length: (cm) 1 Width: (cm) 1 Depth: (cm) 0.1 Area: (cm) 0.785 Volume: (cm) 0.079 % Reduction in Area: -78.4% % Reduction in Volume: -79.5% Epithelialization: None Tunneling: No Undermining: No Wound Description Classification: Partial Thickness Wound Margin: Flat and Intact Exudate Amount: Large Exudate Type: Serosanguineous Exudate Color: red, brown Wound Bed Granulation Amount: Large (67-100%) Exposed Structure Morici, [name] H. ([id number]) Necrotic Amount: Small (1-33%) Fascia Exposed: No Necrotic Quality: Adherent Slough Fat Layer Exposed: No Tendon Exposed: No Muscle Exposed: No Joint Exposed: No Bone Exposed: No Limited to Skin Breakdown Periwound Skin Texture Texture Color No  Abnormalities Noted: No No Abnormalities Noted: No Callus: No Atrophie Blanche: No Crepitus: No Cyanosis: No Excoriation: No Ecchymosis: No Fluctuance: No Erythema: No Friable: No Hemosiderin Staining: No Induration: No Mottled: No Localized Edema: No Pallor: No Rash: No Rubor: No Scarring: No Moisture No Abnormalities Noted: No Dry / Scaly: No Maceration: No Moist: No Wound Preparation Ulcer Cleansing: Rinsed/Irrigated with Saline Topical Anesthetic Applied: Other: lidocaine 4%, Treatment Notes Wound #6 (Left, Anterior Lower Leg) 1. Cleansed with: Clean wound with Normal Saline Cleanse  wound with antibacterial soap and water 2. Anesthetic Topical Lidocaine 4% cream to wound bed prior to debridement 5. Secondary Dressing Applied ABD Pad Dry Gauze 7. Secured with Tape 3 Layer Compression System - Bilateral Notes unna to anchor [name] [name] ([id number]) Electronic Signature(s) Signed: 02/13/2016 5:14:55 PM By: Alric Quan Entered By: Alric Quan on 02/13/2016 14:14:43 [name] ([id number]) -------------------------------------------------------------------------------- Wound Assessment Details Patient Name: [name] Date of Service: 02/13/2016 1:30 PM Medical Record Patient Account Number: [id number] Number: Treating RN: Ahmed Prima Feb 04, 1951 (65 y.o. Other Clinician: Date of Birth/Sex: Female) Treating ROBSON, Harrah Primary Care Physician: LADA, MELINDA Physician/Extender: G Referring Physician: LADA, MELINDA Weeks in Treatment: 12 Wound Status Wound Number: 7 Primary To be determined Etiology: Wound Location: Left, Medial Lower Leg Wound Open Wounding Event: Gradually Appeared Status: Date Acquired: 02/06/2016 Comorbid Cataracts, Chronic Obstructive Weeks Of Treatment: 1 History: Pulmonary Disease (COPD), Sleep Clustered Wound: No Apnea, Hypertension, Osteoarthritis, Neuropathy Photos Photo Uploaded  By: Alric Quan on 02/13/2016 16:45:04 Wound Measurements Length: (cm) 1 Width: (cm) 1 Depth: (cm) 0.1 Area: (cm) 0.785 Volume: (cm) 0.079 % Reduction in Area: 52.4% % Reduction in Volume: 52.1% Epithelialization: None Wound Description Classification: Partial Thickness Wound Margin: Flat and Intact Exudate Amount: Large Exudate Type: Serous Exudate Color: amber Foul Odor After Cleansing: No Wound Bed Granulation Amount: Medium (34-66%) Exposed Structure [name], [name] H. ([id number]) Granulation Quality: Pink Fascia Exposed: No Necrotic Amount: Medium (34-66%) Fat Layer Exposed: No Necrotic Quality: Adherent Slough Tendon Exposed: No Muscle Exposed: No Joint Exposed: No Bone Exposed: No Limited to Skin Breakdown Periwound Skin Texture Texture Color No Abnormalities Noted: No No Abnormalities Noted: No Erythema: Yes Moisture Erythema Location: Circumferential No Abnormalities Noted: No Moist: Yes Temperature / Pain Temperature: No Abnormality Tenderness on Palpation: Yes Wound Preparation Ulcer Cleansing: Rinsed/Irrigated with Saline Topical Anesthetic Applied: Other: lidocaine 4%, Treatment Notes Wound #7 (Left, Medial Lower Leg) 1. Cleansed with: Clean wound with Normal Saline Cleanse wound with antibacterial soap and water 2. Anesthetic Topical Lidocaine 4% cream to wound bed prior to debridement 5. Secondary Dressing Applied ABD Pad Dry Gauze 7. Secured with Tape 3 Layer Compression System - Bilateral Notes unna to anchor Electronic Signature(s) Signed: 02/13/2016 5:14:55 PM By: Alric Quan Entered By: Alric Quan on 02/13/2016 14:14:04 [name] ([id number]) -------------------------------------------------------------------------------- Vitals Details Patient Name: [name] Date of Service: 02/13/2016 1:30 PM Medical Record Patient Account Number: [id number] Number: Treating RN: Ahmed Prima 12-19-1950  (65 y.o. Other Clinician: Date of Birth/Sex: Female) Treating ROBSON, MICHAEL Primary Care Physician: LADA, MELINDA Physician/Extender: G Referring Physician: LADA, MELINDA Weeks in Treatment: 12 Vital Signs Time Taken: 13:57 Temperature (F): 98.7 Height (in): 63 Pulse (bpm): 70 Weight (lbs): 270 Respiratory Rate (breaths/min): 20 Body Mass Index (BMI): 47.8 Blood Pressure (mmHg): 137/65 Reference Range: 80 - 120 mg / dl Electronic Signature(s) Signed: 02/13/2016 5:14:55 PM By: Alric Quan Entered By: Alric Quan on 02/13/2016 13:57:40

## 2016-02-15 DIAGNOSIS — I70232 Atherosclerosis of native arteries of right leg with ulceration of calf: Secondary | ICD-10-CM | POA: Diagnosis not present

## 2016-02-15 DIAGNOSIS — I70234 Atherosclerosis of native arteries of right leg with ulceration of heel and midfoot: Secondary | ICD-10-CM | POA: Diagnosis not present

## 2016-02-15 DIAGNOSIS — L97929 Non-pressure chronic ulcer of unspecified part of left lower leg with unspecified severity: Secondary | ICD-10-CM | POA: Diagnosis not present

## 2016-02-15 DIAGNOSIS — L97219 Non-pressure chronic ulcer of right calf with unspecified severity: Secondary | ICD-10-CM | POA: Diagnosis not present

## 2016-02-15 DIAGNOSIS — L97419 Non-pressure chronic ulcer of right heel and midfoot with unspecified severity: Secondary | ICD-10-CM | POA: Diagnosis not present

## 2016-02-15 DIAGNOSIS — I10 Essential (primary) hypertension: Secondary | ICD-10-CM | POA: Diagnosis not present

## 2016-02-15 DIAGNOSIS — G35 Multiple sclerosis: Secondary | ICD-10-CM | POA: Diagnosis not present

## 2016-02-15 DIAGNOSIS — R339 Retention of urine, unspecified: Secondary | ICD-10-CM | POA: Diagnosis not present

## 2016-02-15 DIAGNOSIS — Z792 Long term (current) use of antibiotics: Secondary | ICD-10-CM | POA: Diagnosis not present

## 2016-02-15 DIAGNOSIS — L03116 Cellulitis of left lower limb: Secondary | ICD-10-CM | POA: Diagnosis not present

## 2016-02-15 DIAGNOSIS — I70249 Atherosclerosis of native arteries of left leg with ulceration of unspecified site: Secondary | ICD-10-CM | POA: Diagnosis not present

## 2016-02-15 DIAGNOSIS — J449 Chronic obstructive pulmonary disease, unspecified: Secondary | ICD-10-CM | POA: Diagnosis not present

## 2016-02-16 ENCOUNTER — Ambulatory Visit: Payer: Medicare Other | Admitting: Surgery

## 2016-02-16 DIAGNOSIS — J449 Chronic obstructive pulmonary disease, unspecified: Secondary | ICD-10-CM | POA: Diagnosis not present

## 2016-02-16 DIAGNOSIS — Z792 Long term (current) use of antibiotics: Secondary | ICD-10-CM | POA: Diagnosis not present

## 2016-02-16 DIAGNOSIS — G35 Multiple sclerosis: Secondary | ICD-10-CM | POA: Diagnosis not present

## 2016-02-16 DIAGNOSIS — L97419 Non-pressure chronic ulcer of right heel and midfoot with unspecified severity: Secondary | ICD-10-CM | POA: Diagnosis not present

## 2016-02-16 DIAGNOSIS — I70232 Atherosclerosis of native arteries of right leg with ulceration of calf: Secondary | ICD-10-CM | POA: Diagnosis not present

## 2016-02-16 DIAGNOSIS — I70234 Atherosclerosis of native arteries of right leg with ulceration of heel and midfoot: Secondary | ICD-10-CM | POA: Diagnosis not present

## 2016-02-16 DIAGNOSIS — I10 Essential (primary) hypertension: Secondary | ICD-10-CM | POA: Diagnosis not present

## 2016-02-16 DIAGNOSIS — I70249 Atherosclerosis of native arteries of left leg with ulceration of unspecified site: Secondary | ICD-10-CM | POA: Diagnosis not present

## 2016-02-16 DIAGNOSIS — L97929 Non-pressure chronic ulcer of unspecified part of left lower leg with unspecified severity: Secondary | ICD-10-CM | POA: Diagnosis not present

## 2016-02-16 DIAGNOSIS — L03116 Cellulitis of left lower limb: Secondary | ICD-10-CM | POA: Diagnosis not present

## 2016-02-16 DIAGNOSIS — R339 Retention of urine, unspecified: Secondary | ICD-10-CM | POA: Diagnosis not present

## 2016-02-16 DIAGNOSIS — L97219 Non-pressure chronic ulcer of right calf with unspecified severity: Secondary | ICD-10-CM | POA: Diagnosis not present

## 2016-02-19 DIAGNOSIS — Z792 Long term (current) use of antibiotics: Secondary | ICD-10-CM | POA: Diagnosis not present

## 2016-02-19 DIAGNOSIS — G35 Multiple sclerosis: Secondary | ICD-10-CM | POA: Diagnosis not present

## 2016-02-19 DIAGNOSIS — L97929 Non-pressure chronic ulcer of unspecified part of left lower leg with unspecified severity: Secondary | ICD-10-CM | POA: Diagnosis not present

## 2016-02-19 DIAGNOSIS — L97219 Non-pressure chronic ulcer of right calf with unspecified severity: Secondary | ICD-10-CM | POA: Diagnosis not present

## 2016-02-19 DIAGNOSIS — L03116 Cellulitis of left lower limb: Secondary | ICD-10-CM | POA: Diagnosis not present

## 2016-02-19 DIAGNOSIS — J449 Chronic obstructive pulmonary disease, unspecified: Secondary | ICD-10-CM | POA: Diagnosis not present

## 2016-02-19 DIAGNOSIS — I70234 Atherosclerosis of native arteries of right leg with ulceration of heel and midfoot: Secondary | ICD-10-CM | POA: Diagnosis not present

## 2016-02-19 DIAGNOSIS — R339 Retention of urine, unspecified: Secondary | ICD-10-CM | POA: Diagnosis not present

## 2016-02-19 DIAGNOSIS — L97419 Non-pressure chronic ulcer of right heel and midfoot with unspecified severity: Secondary | ICD-10-CM | POA: Diagnosis not present

## 2016-02-19 DIAGNOSIS — I70232 Atherosclerosis of native arteries of right leg with ulceration of calf: Secondary | ICD-10-CM | POA: Diagnosis not present

## 2016-02-19 DIAGNOSIS — I70249 Atherosclerosis of native arteries of left leg with ulceration of unspecified site: Secondary | ICD-10-CM | POA: Diagnosis not present

## 2016-02-19 DIAGNOSIS — I10 Essential (primary) hypertension: Secondary | ICD-10-CM | POA: Diagnosis not present

## 2016-02-20 ENCOUNTER — Encounter: Payer: Medicare Other | Admitting: Internal Medicine

## 2016-02-20 DIAGNOSIS — Z79899 Other long term (current) drug therapy: Secondary | ICD-10-CM | POA: Diagnosis not present

## 2016-02-20 DIAGNOSIS — I87312 Chronic venous hypertension (idiopathic) with ulcer of left lower extremity: Secondary | ICD-10-CM | POA: Diagnosis not present

## 2016-02-20 DIAGNOSIS — I10 Essential (primary) hypertension: Secondary | ICD-10-CM | POA: Diagnosis not present

## 2016-02-20 DIAGNOSIS — L97212 Non-pressure chronic ulcer of right calf with fat layer exposed: Secondary | ICD-10-CM | POA: Diagnosis not present

## 2016-02-20 DIAGNOSIS — L97512 Non-pressure chronic ulcer of other part of right foot with fat layer exposed: Secondary | ICD-10-CM | POA: Diagnosis not present

## 2016-02-20 DIAGNOSIS — G35 Multiple sclerosis: Secondary | ICD-10-CM | POA: Diagnosis not present

## 2016-02-20 DIAGNOSIS — S81801A Unspecified open wound, right lower leg, initial encounter: Secondary | ICD-10-CM | POA: Diagnosis not present

## 2016-02-20 DIAGNOSIS — J449 Chronic obstructive pulmonary disease, unspecified: Secondary | ICD-10-CM | POA: Diagnosis not present

## 2016-02-20 DIAGNOSIS — I739 Peripheral vascular disease, unspecified: Secondary | ICD-10-CM | POA: Diagnosis not present

## 2016-02-20 DIAGNOSIS — G4733 Obstructive sleep apnea (adult) (pediatric): Secondary | ICD-10-CM | POA: Diagnosis not present

## 2016-02-20 DIAGNOSIS — L97822 Non-pressure chronic ulcer of other part of left lower leg with fat layer exposed: Secondary | ICD-10-CM | POA: Diagnosis not present

## 2016-02-20 DIAGNOSIS — L97222 Non-pressure chronic ulcer of left calf with fat layer exposed: Secondary | ICD-10-CM | POA: Diagnosis not present

## 2016-02-21 ENCOUNTER — Other Ambulatory Visit: Payer: Self-pay | Admitting: Internal Medicine

## 2016-02-21 ENCOUNTER — Telehealth: Payer: Self-pay | Admitting: Family Medicine

## 2016-02-21 DIAGNOSIS — G35 Multiple sclerosis: Secondary | ICD-10-CM | POA: Diagnosis not present

## 2016-02-21 DIAGNOSIS — R339 Retention of urine, unspecified: Secondary | ICD-10-CM | POA: Diagnosis not present

## 2016-02-21 DIAGNOSIS — L97419 Non-pressure chronic ulcer of right heel and midfoot with unspecified severity: Secondary | ICD-10-CM | POA: Diagnosis not present

## 2016-02-21 DIAGNOSIS — I10 Essential (primary) hypertension: Secondary | ICD-10-CM | POA: Diagnosis not present

## 2016-02-21 DIAGNOSIS — I70249 Atherosclerosis of native arteries of left leg with ulceration of unspecified site: Secondary | ICD-10-CM | POA: Diagnosis not present

## 2016-02-21 DIAGNOSIS — Z792 Long term (current) use of antibiotics: Secondary | ICD-10-CM | POA: Diagnosis not present

## 2016-02-21 DIAGNOSIS — J449 Chronic obstructive pulmonary disease, unspecified: Secondary | ICD-10-CM | POA: Diagnosis not present

## 2016-02-21 DIAGNOSIS — L98499 Non-pressure chronic ulcer of skin of other sites with unspecified severity: Secondary | ICD-10-CM

## 2016-02-21 DIAGNOSIS — I70232 Atherosclerosis of native arteries of right leg with ulceration of calf: Secondary | ICD-10-CM | POA: Diagnosis not present

## 2016-02-21 DIAGNOSIS — L97929 Non-pressure chronic ulcer of unspecified part of left lower leg with unspecified severity: Secondary | ICD-10-CM

## 2016-02-21 DIAGNOSIS — L97219 Non-pressure chronic ulcer of right calf with unspecified severity: Secondary | ICD-10-CM | POA: Diagnosis not present

## 2016-02-21 DIAGNOSIS — I70234 Atherosclerosis of native arteries of right leg with ulceration of heel and midfoot: Secondary | ICD-10-CM | POA: Diagnosis not present

## 2016-02-21 DIAGNOSIS — L03116 Cellulitis of left lower limb: Secondary | ICD-10-CM | POA: Diagnosis not present

## 2016-02-21 DIAGNOSIS — L97919 Non-pressure chronic ulcer of unspecified part of right lower leg with unspecified severity: Secondary | ICD-10-CM

## 2016-02-21 NOTE — Telephone Encounter (Signed)
Lisa notified.

## 2016-02-21 NOTE — Telephone Encounter (Signed)
Mariel Kansky from [facility] requesting a verbal for skill nursing 3x weekly for wound care and cath change once monthly. Patient also had fall with no injuries  [phone]

## 2016-02-21 NOTE — Telephone Encounter (Signed)
That's fine, thank you  

## 2016-02-22 NOTE — Progress Notes (Signed)
JAYSON, MCNEASE ([id number]) Visit Report for 02/20/2016 Arrival Information Details Patient Name: [name], [name] Date of Service: 02/20/2016 2:15 PM Medical Record Patient Account Number: [id number] Number: Treating RN: Ahmed Prima 1950/06/26 (65 y.o. Other Clinician: Date of Birth/Sex: Female) Treating ROBSON, [place] Primary Care Physician: LADA, MELINDA Physician/Extender: G Referring Physician: LADA, MELINDA Weeks in Treatment: 13 Visit Information History Since Last Visit All ordered tests and consults were completed: No Patient Arrived: Wheel Chair Added or deleted any medications: No Arrival Time: 14:37 Any new allergies or adverse reactions: No Accompanied By: self Had a fall or experienced change in No Transfer Assistance: [employer] activities of daily living that may affect Patient Lift risk of falls: Patient Identification Verified: Yes Signs or symptoms of abuse/neglect since last No Secondary Verification Process Yes visito Completed: Hospitalized since last visit: No Patient Requires Transmission- No Pain Present Now: No Based Precautions: Patient Has Alerts: Yes Electronic Signature(s) Signed: 02/21/2016 5:37:51 PM By: Alric Quan Entered By: Alric Quan on 02/20/2016 14:37:27 [name] -------------------------------------------------------------------------------- Encounter Discharge Information Details Patient Name: [name] Date of Service: 02/20/2016 2:15 PM Medical Record Patient Account Number: [id number] Number: Treating RN: Ahmed Prima 09/30/1950 (65 y.o. Other Clinician: Date of Birth/Sex: Female) Treating ROBSON, [place] Primary Care Physician: LADA, MELINDA Physician/Extender: G Referring Physician: LADA, MELINDA Weeks in Treatment: 41 Encounter Discharge Information Items Discharge Pain Level: 0 Discharge Condition: Stable Ambulatory Status: Wheelchair Discharge  Destination: Home Transportation: Private Auto Accompanied By: husband Schedule Follow-up Appointment: Yes Medication Reconciliation completed and provided to Patient/Care Yes Rhylee Nunn: Provided on Clinical Summary of Care: 02/20/2016 Form Type Recipient Paper Patient BW Electronic Signature(s) Signed: 02/20/2016 3:41:56 PM By: Sharon Mt Entered By: Sharon Mt on 02/20/2016 15:41:55 [name] -------------------------------------------------------------------------------- Lower Extremity Assessment Details Patient Name: [name] Date of Service: 02/20/2016 2:15 PM Medical Record Patient Account Number: [id number] Number: Treating RN: Ahmed Prima 11-Dec-1950 (65 y.o. Other Clinician: Date of Birth/Sex: Female) Treating ROBSON, [place] Primary Care Physician: LADA, MELINDA Physician/Extender: G Referring Physician: LADA, MELINDA Weeks in Treatment: 13 Edema Assessment Assessed: [Left: No] [Right: No] E[Left: dema] [Right: :] Calf Left: Right: Point of Measurement: 33 cm From Medial Instep 39.2 cm 36.5 cm Ankle Left: Right: Point of Measurement: 11 cm From Medial Instep 22.2 cm 22 cm Vascular Assessment Pulses: Posterior Tibial Dorsalis Pedis Palpable: [Left:Yes] [Right:Yes] Extremity colors, hair growth, and conditions: Extremity Color: [Left:Red] [Right:Normal] Temperature of Extremity: [Left:Warm] [Right:Warm] Capillary Refill: [Left:< 3 seconds] [Right:< 3 seconds] Electronic Signature(s) Signed: 02/21/2016 5:37:51 PM By: Alric Quan Entered By: Alric Quan on 02/20/2016 14:44:17 [name], [name] -------------------------------------------------------------------------------- Multi Wound Chart Details Patient Name: [name] Date of Service: 02/20/2016 2:15 PM Medical Record Patient Account Number: [id number] Number: Treating RN: Ahmed Prima 08/23/1950 (65 y.o. Other  Clinician: Date of Birth/Sex: Female) Treating ROBSON, MICHAEL Primary Care Physician: LADA, MELINDA Physician/Extender: G Referring Physician: LADA, MELINDA Weeks in Treatment: 13 Vital Signs Height(in): 63 Pulse(bpm): 60 Weight(lbs): 270 Blood Pressure 120/61 (mmHg): Body Mass Index(BMI): 48 Temperature(F): 98.1 Respiratory Rate 20 (breaths/min): Photos: [2:No Photos] [3:No Photos] [4:No Photos] Wound Location: [2:Right Foot - Dorsal] [3:Right Lower Leg] [4:Left Lower Leg - Lateral, Proximal] Wounding Event: [2:Trauma] [3:Trauma] [4:Trauma] Primary Etiology: [2:Trauma, Other] [3:Trauma, Other] [4:Venous Leg Ulcer] Comorbid History: [2:Cataracts, Chronic Obstructive Pulmonary Disease (COPD), Sleep Disease (COPD), Sleep Disease (COPD), Sleep Apnea, Hypertension, Osteoarthritis, Neuropathy Osteoarthritis, Neuropathy Osteoarthritis, Neuropathy] [3:Cataracts, Chronic  Obstructive Pulmonary Apnea, Hypertension,] [4:Cataracts, Chronic Obstructive Pulmonary  Apnea, Hypertension,] Date Acquired: [2:07/21/2015] [3:10/09/2015] [4:10/09/2015] Weeks of Treatment: [2:13] [3:13] [4:13] Wound Status: [2:Open] [3:Open] [4:Open] Clustered Wound: [2:No] [3:No] [4:Yes] Measurements L x W x D 0.3x0.5x0.1 [3:1.7x1.6x0.2] [4:4.2x2.6x0.2] (cm) Area (cm) : [2:0.118] [3:2.136] [4:8.577] Volume (cm) : [2:0.012] [3:0.427] [4:1.715] % Reduction in Area: [2:70.00%] [3:-331.50%] [4:-9024.50%] % Reduction in Volume: 84.80% [3:-188.50%] [4:-18955.60%] Classification: [2:Partial Thickness] [3:Partial Thickness] [4:Partial Thickness] Exudate Amount: [2:Medium] [3:Large] [4:Large] Exudate Type: [2:Serous] [3:Serous] [4:Serous] Exudate Color: [2:amber] [3:amber] [4:amber] Wound Margin: [2:Flat and Intact] [3:Flat and Intact] [4:Flat and Intact] Granulation Amount: [2:None Present (0%)] [3:None Present (0%)] [4:Small (1-33%)] Granulation Quality: [2:N/A] [3:N/A] [4:Pale] Necrotic Amount: [2:Large (67-100%)]  [3:Large (67-100%)] [4:Large (67-100%)] Necrotic Tissue: [place] Exposed Structures: Fascia: No Fascia: No Fascia: No Fat: No Fat: No Fat: No Tendon: No Tendon: No Tendon: No Muscle: No Muscle: No Muscle: No Joint: No Joint: No Joint: No Bone: No Bone: No Bone: No Limited to Skin Limited to Skin Limited to Skin Breakdown Breakdown Breakdown Epithelialization: None None None Periwound Skin Texture: Edema: Yes Edema: No Excoriation: Yes Excoriation: No Excoriation: No Edema: No Induration: No Induration: No Induration: No Callus: No Callus: No Callus: No Crepitus: No Crepitus: No Crepitus: No Fluctuance: No Fluctuance: No Fluctuance: No Friable: No Friable: No Friable: No Rash: No Rash: No Rash: No Scarring: No Scarring: No Scarring: No Periwound Skin Maceration: No Maceration: No Moist: Yes Moisture: Moist: No Moist: No Maceration: No Dry/Scaly: No Dry/Scaly: No Dry/Scaly: No Periwound Skin Color: Ecchymosis: Yes Erythema: Yes Erythema: Yes Erythema: Yes Atrophie Blanche: No Atrophie Blanche: No Atrophie Blanche: No Cyanosis: No Cyanosis: No Cyanosis: No Ecchymosis: No Ecchymosis: No Hemosiderin Staining: No Hemosiderin Staining: No Hemosiderin Staining: No Mottled: No Mottled: No Mottled: No Pallor: No Pallor: No Pallor: No Rubor: No Rubor: No Rubor: No Erythema Location: Circumferential Circumferential Circumferential Temperature: No Abnormality No Abnormality No Abnormality Tenderness on Yes Yes Yes Palpation: Wound Preparation: Ulcer Cleansing: Ulcer Cleansing: Ulcer Cleansing: Rinsed/Irrigated with Rinsed/Irrigated with Rinsed/Irrigated with Saline Saline Saline Topical Anesthetic Topical Anesthetic Topical Anesthetic Applied: Other: lidocaine Applied: Other: lidocaine Applied: Other: lidocaine 4% 4% 4% Wound Number: [id number]  Photos: No Photos No Photos No Photos Wound Location: Left Lower  Leg - Lateral, Left Lower Leg - Anterior Left Lower Leg - Medial Distal Wounding Event: Gradually Appeared Gradually Appeared Gradually Appeared Primary Etiology: Venous Leg Ulcer Venous Leg Ulcer Venous Leg Ulcer Comorbid History: Cataracts, Chronic Cataracts, Chronic Cataracts, Chronic Obstructive Pulmonary Obstructive Pulmonary Obstructive Pulmonary Smead, Daphane [name]. ([id number]) Disease (COPD), Sleep Disease (COPD), Sleep Disease (COPD), Sleep Apnea, Hypertension, Apnea, Hypertension, Apnea, Hypertension, Osteoarthritis, Neuropathy Osteoarthritis, Neuropathy Osteoarthritis, Neuropathy Date Acquired: 12/21/2015 12/21/2015 02/06/2016 Weeks of Treatment: 8 8 2  Wound Status: Open Open Open Clustered Wound: No No No Measurements L x W x D 1.9x1.5x0.3 0x0x0 0.7x1x0.1 (cm) Area (cm) : 2.238 0 0.55 Volume (cm) : 0.672 0 0.055 % Reduction in Area: -306.90% 100.00% 66.60% % Reduction in Volume: -1121.80% 100.00% 66.70% Classification: Partial Thickness Partial Thickness Partial Thickness Exudate Amount: Large None Present Large Exudate Type: Serous N/A Serous Exudate Color: amber N/A amber Wound Margin: Flat and Intact Flat and Intact Flat and Intact Granulation Amount: None Present (0%) None Present (0%) Large (67-100%) Granulation Quality: N/A N/A Pink Necrotic Amount: Large (67-100%) None Present (0%) None Present (0%) Necrotic Tissue: Adherent Slough N/A N/A Exposed Structures: Fascia: No Fascia: No Fascia: No Fat: No Fat: No Fat: No Tendon: No Tendon: No Tendon: No Muscle: No Muscle: No  Muscle: No Joint: No Joint: No Joint: No Bone: No Bone: No Bone: No Limited to Skin Limited to Skin Limited to Skin Breakdown Breakdown Breakdown Epithelialization: None Large (67-100%) None Periwound Skin Texture: Edema: No Edema: No No Abnormalities Noted Excoriation: No Excoriation: No Induration: No Induration: No Callus: No Callus: No Crepitus: No Crepitus: No Fluctuance:  No Fluctuance: No Friable: No Friable: No Rash: No Rash: No Scarring: No Scarring: No Periwound Skin Moist: Yes Maceration: No Moist: Yes Moisture: Maceration: No Moist: No Dry/Scaly: No Dry/Scaly: No Periwound Skin Color: Erythema: Yes Atrophie Blanche: No Erythema: Yes Atrophie Blanche: No Cyanosis: No Cyanosis: No Ecchymosis: No Ecchymosis: No Erythema: No Hemosiderin Staining: No Hemosiderin Staining: No Mottled: No Mottled: No [name], [name]. ([id number]) Pallor: No Pallor: No Rubor: No Rubor: No Erythema Location: Circumferential N/A Circumferential Temperature: No Abnormality N/A No Abnormality Tenderness on Yes No Yes Palpation: Wound Preparation: Ulcer Cleansing: Ulcer Cleansing: Ulcer Cleansing: Rinsed/Irrigated with Rinsed/Irrigated with Rinsed/Irrigated with Saline Saline Saline Topical Anesthetic Topical Anesthetic Topical Anesthetic Applied: Other: lidocaine Applied: None Applied: Other: lidocaine 4% 4% Treatment Notes Electronic Signature(s) Signed: 02/21/2016 5:37:51 PM By: Alric Quan Entered By: Alric Quan on 02/20/2016 15:09:50 [name] -------------------------------------------------------------------------------- Multi-Disciplinary Care Plan Details Patient Name: [name] Date of Service: 02/20/2016 2:15 PM Medical Record Patient Account Number: [id number] Number: Treating RN: Ahmed Prima [birth year] (65 y.o. Other Clinician: Date of Birth/Sex: Female) Treating ROBSON, [place] Primary Care Physician: LADA, MELINDA Physician/Extender: G Referring Physician: LADA, MELINDA Weeks in Treatment: 81 Active Inactive Abuse / Safety / Falls / Self Care Management Nursing Diagnoses: Impaired physical mobility Potential for falls Goals: Patient will remain injury free Date Initiated: 11/20/2015 Goal Status: Active Interventions: Assess fall risk on admission and as  needed Notes: Nutrition Nursing Diagnoses: Imbalanced nutrition Goals: Patient/caregiver agrees to and verbalizes understanding of need to use nutritional supplements and/or vitamins as prescribed Date Initiated: 11/20/2015 Goal Status: Active Interventions: Provide education on nutrition Notes: Orientation to the Wound Care Program Nursing Diagnoses: Knowledge deficit related to the wound healing center program [name], [name] Goals: Patient/caregiver will verbalize understanding of the [place] Date Initiated: 11/20/2015 Goal Status: Active Interventions: Provide education on orientation to the wound center Notes: Wound/Skin Impairment Nursing Diagnoses: Impaired tissue integrity Goals: Patient will demonstrate a reduced rate of smoking or cessation of smoking Date Initiated: 11/20/2015 Goal Status: Active Ulcer/skin breakdown will heal within 14 weeks Date Initiated: 11/20/2015 Goal Status: Active Interventions: Assess patient/caregiver ability to obtain necessary supplies Provide education on smoking Notes: Electronic Signature(s) Signed: 02/21/2016 5:37:51 PM By: Alric Quan Entered By: Alric Quan on 02/20/2016 15:02:41 [name] -------------------------------------------------------------------------------- Pain Assessment Details Patient Name: [name] Date of Service: 02/20/2016 2:15 PM Medical Record Patient Account Number: [id number] Number: Treating RN: Ahmed Prima 06/17/1950 (65 y.o. Other Clinician: Date of Birth/Sex: Female) Treating ROBSON, [place] Primary Care Physician: LADA, MELINDA Physician/Extender: G Referring Physician: LADA, MELINDA Weeks in Treatment: 13 Active Problems Location of Pain Severity and Description of Pain Patient Has Paino No Site Locations With Dressing Change: No Pain Management and Medication Current Pain Management: Electronic  Signature(s) Signed: 02/21/2016 5:37:51 PM By: Alric Quan Entered By: Alric Quan on 02/20/2016 14:37:34 [name] -------------------------------------------------------------------------------- Patient/Caregiver Education Details Patient Name: [name] Date of Service: 02/20/2016 2:15 PM Medical Record Patient Account Number: [id number] Number: Treating RN: Ahmed Prima 06-29-1950 (65 y.o. Other Clinician: Date of Birth/Gender: Female) Treating ROBSON, MICHAEL  Primary Care Physician: LADA, MELINDA Physician/Extender: G Referring Physician: LADA, MELINDA Weeks in Treatment: 4 Education Assessment Education Provided To: Patient Education Topics Provided Wound/Skin Impairment: Handouts: Other: change dressing as ordered and do not get dressing wet Methods: Demonstration, Explain/Verbal Responses: State content correctly Electronic Signature(s) Signed: 02/21/2016 5:37:51 PM By: Alric Quan Entered By: Alric Quan on 02/20/2016 15:05:01 [name] -------------------------------------------------------------------------------- Wound Assessment Details Patient Name: [name] Date of Service: 02/20/2016 2:15 PM Medical Record Patient Account Number: [id number] Number: Treating RN: Ahmed Prima 1950-08-27 (65 y.o. Other Clinician: Date of Birth/Sex: Female) Treating ROBSON, MICHAEL Primary Care Physician: LADA, MELINDA Physician/Extender: G Referring Physician: LADA, MELINDA Weeks in Treatment: 13 Wound Status Wound Number: 2 Primary Trauma, Other Etiology: Wound Location: Right Foot - Dorsal Wound Open Wounding Event: Trauma Status: Date Acquired: 07/21/2015 Comorbid Cataracts, Chronic Obstructive Weeks Of Treatment: 13 History: Pulmonary Disease (COPD), Sleep Clustered Wound: No Apnea, Hypertension, Osteoarthritis, Neuropathy Photos Photo Uploaded By: Alric Quan on  02/20/2016 16:04:32 Wound Measurements Length: (cm) 0.3 Width: (cm) 0.5 Depth: (cm) 0.1 Area: (cm) 0.118 Volume: (cm) 0.012 % Reduction in Area: 70% % Reduction in Volume: 84.8% Epithelialization: None Tunneling: No Undermining: No Wound Description Classification: Partial Thickness Wound Margin: Flat and Intact Exudate Amount: Medium Exudate Type: Serous Exudate Color: amber Wound Bed Granulation Amount: None Present (0%) Exposed Structure [name], [name]. ([id number]) Necrotic Amount: Large (67-100%) Fascia Exposed: No Necrotic Quality: Eschar Fat Layer Exposed: No Tendon Exposed: No Muscle Exposed: No Joint Exposed: No Bone Exposed: No Limited to Skin Breakdown Periwound Skin Texture Texture Color No Abnormalities Noted: No No Abnormalities Noted: No Callus: No Atrophie Blanche: No Crepitus: No Cyanosis: No Excoriation: No Ecchymosis: Yes Fluctuance: No Erythema: Yes Friable: No Erythema Location: Circumferential Induration: No Hemosiderin Staining: No Localized Edema: Yes Mottled: No Rash: No Pallor: No Scarring: No Rubor: No Moisture Temperature / Pain No Abnormalities Noted: No Temperature: No Abnormality Dry / Scaly: No Tenderness on Palpation: Yes Maceration: No Moist: No Wound Preparation Ulcer Cleansing: Rinsed/Irrigated with Saline Topical Anesthetic Applied: Other: lidocaine 4%, Treatment Notes Wound #2 (Right, Dorsal Foot) 1. Cleansed with: Clean wound with Normal Saline Cleanse wound with antibacterial soap and water 2. Anesthetic Topical Lidocaine 4% cream to wound bed prior to debridement 5. Secondary Dressing Applied ABD Pad Dry Gauze 7. Secured with Tape 3 Layer Compression System - Bilateral Notes silver alginate KRISTIANA, BOWERMAN ([id number]) Electronic Signature(s) Signed: 02/21/2016 5:37:51 PM By: Alric Quan Entered By: Alric Quan on 02/20/2016 15:00:52 [name]  ([id number]) -------------------------------------------------------------------------------- Wound Assessment Details Patient Name: [name] Date of Service: 02/20/2016 2:15 PM Medical Record Patient Account Number: [id number] Number: Treating RN: Ahmed Prima December 15, 1950 (65 y.o. Other Clinician: Date of Birth/Sex: Female) Treating ROBSON, [place] Primary Care Physician: LADA, MELINDA Physician/Extender: G Referring Physician: LADA, MELINDA Weeks in Treatment: 13 Wound Status Wound Number: 3 Primary Trauma, Other Etiology: Wound Location: Right Lower Leg Wound Open Wounding Event: Trauma Status: Date Acquired: 10/09/2015 Comorbid Cataracts, Chronic Obstructive Weeks Of Treatment: 13 History: Pulmonary Disease (COPD), Sleep Clustered Wound: No Apnea, Hypertension, Osteoarthritis, Neuropathy Photos Photo Uploaded By: Alric Quan on 02/20/2016 16:04:47 Wound Measurements Length: (cm) 1.7 Width: (cm) 1.6 Depth: (cm) 0.2 Area: (cm) 2.136 Volume: (cm) 0.427 % Reduction in Area: -331.5% % Reduction in Volume: -188.5% Epithelialization: None Tunneling: No Undermining: No Wound Description Classification: Partial Thickness Wound Margin: Flat and Intact Exudate Amount: Large Exudate Type: Serous Exudate Color: amber Wound Bed Granulation Amount: None  Present (0%) Exposed Structure [name], [name]. ([id number]) Necrotic Amount: Large (67-100%) Fascia Exposed: No Necrotic Quality: Adherent Slough Fat Layer Exposed: No Tendon Exposed: No Muscle Exposed: No Joint Exposed: No Bone Exposed: No Limited to Skin Breakdown Periwound Skin Texture Texture Color No Abnormalities Noted: No No Abnormalities Noted: No Callus: No Atrophie Blanche: No Crepitus: No Cyanosis: No Excoriation: No Ecchymosis: No Fluctuance: No Erythema: Yes Friable: No Erythema Location: Circumferential Induration: No Hemosiderin Staining: No Localized Edema:  No Mottled: No Rash: No Pallor: No Scarring: No Rubor: No Moisture Temperature / Pain No Abnormalities Noted: No Temperature: No Abnormality Dry / Scaly: No Tenderness on Palpation: Yes Maceration: No Moist: No Wound Preparation Ulcer Cleansing: Rinsed/Irrigated with Saline Topical Anesthetic Applied: Other: lidocaine 4%, Treatment Notes Wound #3 (Right Lower Leg) 1. Cleansed with: Clean wound with Normal Saline Cleanse wound with antibacterial soap and water 2. Anesthetic Topical Lidocaine 4% cream to wound bed prior to debridement 5. Secondary Dressing Applied ABD Pad Dry Gauze 7. Secured with Tape 3 Layer Compression System - Bilateral Notes silver alginate [name], [name] Electronic Signature(s) Signed: 02/21/2016 5:37:51 PM By: Alric Quan Entered By: Alric Quan on 02/20/2016 15:00:33 [name] -------------------------------------------------------------------------------- Wound Assessment Details Patient Name: [name] Date of Service: 02/20/2016 2:15 PM Medical Record Patient Account Number: [id number] Number: Treating RN: Ahmed Prima Nov 24, 1950 (65 y.o. Other Clinician: Date of Birth/Sex: Female) Treating ROBSON, MICHAEL Primary Care Physician: LADA, MELINDA Physician/Extender: G Referring Physician: LADA, MELINDA Weeks in Treatment: 13 Wound Status Wound Number: 4 Primary Venous Leg Ulcer Etiology: Wound Location: Left Lower Leg - Lateral, Proximal Wound Open Status: Wounding Event: Trauma Comorbid Cataracts, Chronic Obstructive Date Acquired: 10/09/2015 History: Pulmonary Disease (COPD), Sleep Weeks Of Treatment: 13 Apnea, Hypertension, Osteoarthritis, Clustered Wound: Yes Neuropathy Photos Photo Uploaded By: Alric Quan on 02/20/2016 16:05:06 Wound Measurements Length: (cm) 4.2 Width: (cm) 2.6 Depth: (cm) 0.2 Area: (cm) 8.577 Volume: (cm) 1.715 % Reduction in Area:  -9024.5% % Reduction in Volume: -18955.6% Epithelialization: None Tunneling: No Undermining: No Wound Description Classification: Partial Thickness Wound Margin: Flat and Intact Exudate Amount: Large Exudate Type: Serous Exudate Color: amber Wound Bed Granulation Amount: Small (1-33%) Exposed Structure [name], [name]. ([id number]) Granulation Quality: Pale Fascia Exposed: No Necrotic Amount: Large (67-100%) Fat Layer Exposed: No Necrotic Quality: Adherent Slough Tendon Exposed: No Muscle Exposed: No Joint Exposed: No Bone Exposed: No Limited to Skin Breakdown Periwound Skin Texture Texture Color No Abnormalities Noted: No No Abnormalities Noted: No Callus: No Atrophie Blanche: No Crepitus: No Cyanosis: No Excoriation: Yes Ecchymosis: No Fluctuance: No Erythema: Yes Friable: No Erythema Location: Circumferential Induration: No Hemosiderin Staining: No Localized Edema: No Mottled: No Rash: No Pallor: No Scarring: No Rubor: No Moisture Temperature / Pain No Abnormalities Noted: No Temperature: No Abnormality Dry / Scaly: No Tenderness on Palpation: Yes Maceration: No Moist: Yes Wound Preparation Ulcer Cleansing: Rinsed/Irrigated with Saline Topical Anesthetic Applied: Other: lidocaine 4%, Treatment Notes Wound #4 (Left, Proximal, Lateral Lower Leg) 1. Cleansed with: Clean wound with Normal Saline Cleanse wound with antibacterial soap and water 2. Anesthetic Topical Lidocaine 4% cream to wound bed prior to debridement 5. Secondary Dressing Applied ABD Pad Dry Gauze 7. Secured with Tape 3 Layer Compression System - Bilateral Notes silver alginate [name], [name] Electronic Signature(s) Signed: 02/21/2016 5:37:51 PM By: Alric Quan Entered By: Alric Quan on 02/20/2016 15:00:07 [place], [name] -------------------------------------------------------------------------------- Wound Assessment Details Patient Name:  [name] Date of Service:  02/20/2016 2:15 PM Medical Record Patient Account Number: [id number] Number: Treating RN: Ahmed Prima 1951-01-22 (65 y.o. Other Clinician: Date of Birth/Sex: Female) Treating ROBSON, MICHAEL Primary Care Physician: LADA, MELINDA Physician/Extender: G Referring Physician: LADA, MELINDA Weeks in Treatment: 13 Wound Status Wound Number: 5 Primary Venous Leg Ulcer Etiology: Wound Location: Left Lower Leg - Lateral, Distal Wound Open Wounding Event: Gradually Appeared Status: Date Acquired: 12/21/2015 Comorbid Cataracts, Chronic Obstructive Weeks Of Treatment: 8 History: Pulmonary Disease (COPD), Sleep Clustered Wound: No Apnea, Hypertension, Osteoarthritis, Neuropathy Photos Photo Uploaded By: Alric Quan on 02/20/2016 16:06:05 Wound Measurements Length: (cm) 1.9 Width: (cm) 1.5 Depth: (cm) 0.3 Area: (cm) 2.238 Volume: (cm) 0.672 % Reduction in Area: -306.9% % Reduction in Volume: -1121.8% Epithelialization: None Tunneling: No Undermining: No Wound Description Classification: Partial Thickness Wound Margin: Flat and Intact Exudate Amount: Large Exudate Type: Serous Exudate Color: amber Wound Bed Granulation Amount: None Present (0%) Exposed Structure [name], [name]. ([id number]) Necrotic Amount: Large (67-100%) Fascia Exposed: No Necrotic Quality: Adherent Slough Fat Layer Exposed: No Tendon Exposed: No Muscle Exposed: No Joint Exposed: No Bone Exposed: No Limited to Skin Breakdown Periwound Skin Texture Texture Color No Abnormalities Noted: No No Abnormalities Noted: No Callus: No Atrophie Blanche: No Crepitus: No Cyanosis: No Excoriation: No Ecchymosis: No Fluctuance: No Erythema: Yes Friable: No Erythema Location: Circumferential Induration: No Hemosiderin Staining: No Localized Edema: No Mottled: No Rash: No Pallor: No Scarring: No Rubor: No Moisture Temperature / Pain No Abnormalities  Noted: No Temperature: No Abnormality Dry / Scaly: No Tenderness on Palpation: Yes Maceration: No Moist: Yes Wound Preparation Ulcer Cleansing: Rinsed/Irrigated with Saline Topical Anesthetic Applied: Other: lidocaine 4%, Treatment Notes Wound #5 (Left, Distal, Lateral Lower Leg) 1. Cleansed with: Clean wound with Normal Saline Cleanse wound with antibacterial soap and water 2. Anesthetic Topical Lidocaine 4% cream to wound bed prior to debridement 5. Secondary Dressing Applied ABD Pad Dry Gauze 7. Secured with Tape 3 Layer Compression System - Bilateral Notes silver alginate SILVIE, ALLBRIGHT ([id number]) Electronic Signature(s) Signed: 02/21/2016 5:37:51 PM By: Alric Quan Entered By: Alric Quan on 02/20/2016 15:01:39 [name] ([id number]) -------------------------------------------------------------------------------- Wound Assessment Details Patient Name: [name] Date of Service: 02/20/2016 2:15 PM Medical Record Patient Account Number: [id number] Number: Treating RN: Ahmed Prima Aug 12, 1950 (65 y.o. Other Clinician: Date of Birth/Sex: Female) Treating ROBSON, MICHAEL Primary Care Physician: LADA, MELINDA Physician/Extender: G Referring Physician: LADA, MELINDA Weeks in Treatment: 13 Wound Status Wound Number: 6 Primary Venous Leg Ulcer Etiology: Wound Location: Left Lower Leg - Anterior Wound Open Wounding Event: Gradually Appeared Status: Date Acquired: 12/21/2015 Comorbid Cataracts, Chronic Obstructive Weeks Of Treatment: 8 History: Pulmonary Disease (COPD), Sleep Clustered Wound: No Apnea, Hypertension, Osteoarthritis, Neuropathy Photos Photo Uploaded By: Alric Quan on 02/20/2016 16:06:26 Wound Measurements Length: (cm) 0 % Reduction i Width: (cm) 0 % Reduction i Depth: (cm) 0 Epithelializa Area: (cm) 0 Tunneling: Volume: (cm) 0 Undermining: n Area: 100% n Volume: 100% tion: Large  (67-100%) No No Wound Description Classification: Partial Thickness Wound Margin: Flat and Intact Exudate Amount: None Present Foul Odor After Cleansing: No Wound Bed Granulation Amount: None Present (0%) Exposed Structure Necrotic Amount: None Present (0%) Fascia Exposed: No Fat Layer Exposed: No Dillard, Erdine [name]. ([id number]) Tendon Exposed: No Muscle Exposed: No Joint Exposed: No Bone Exposed: No Limited to Skin Breakdown Periwound Skin Texture Texture Color No Abnormalities Noted: No No Abnormalities Noted: No Callus: No Atrophie Blanche: No Crepitus: No Cyanosis: No Excoriation: No Ecchymosis: No  Fluctuance: No Erythema: No Friable: No Hemosiderin Staining: No Induration: No Mottled: No Localized Edema: No Pallor: No Rash: No Rubor: No Scarring: No Moisture No Abnormalities Noted: No Dry / Scaly: No Maceration: No Moist: No Wound Preparation Ulcer Cleansing: Rinsed/Irrigated with Saline Topical Anesthetic Applied: None Electronic Signature(s) Signed: 02/21/2016 5:37:51 PM By: Alric Quan Entered By: Alric Quan on 02/20/2016 14:57:22 Tercero, [name] ([id number]) -------------------------------------------------------------------------------- Wound Assessment Details Patient Name: [name] Date of Service: 02/20/2016 2:15 PM Medical Record Patient Account Number: [id number] Number: Treating RN: Ahmed Prima Mar 18, 1951 (65 y.o. Other Clinician: Date of Birth/Sex: Female) Treating ROBSON, [place] Primary Care Physician: LADA, MELINDA Physician/Extender: G Referring Physician: LADA, MELINDA Weeks in Treatment: 13 Wound Status Wound Number: 7 Primary Venous Leg Ulcer Etiology: Wound Location: Left Lower Leg - Medial Wound Open Wounding Event: Gradually Appeared Status: Date Acquired: 02/06/2016 Comorbid Cataracts, Chronic Obstructive Weeks Of Treatment: 2 History: Pulmonary Disease (COPD), Sleep Clustered Wound:  No Apnea, Hypertension, Osteoarthritis, Neuropathy Photos Photo Uploaded By: Alric Quan on 02/20/2016 16:07:07 Wound Measurements Length: (cm) 0.7 Width: (cm) 1 Depth: (cm) 0.1 Area: (cm) 0.55 Volume: (cm) 0.055 % Reduction in Area: 66.6% % Reduction in Volume: 66.7% Epithelialization: None Tunneling: No Undermining: No Wound Description Classification: Partial Thickness Wound Margin: Flat and Intact Exudate Amount: Large Exudate Type: Serous Exudate Color: amber Foul Odor After Cleansing: No Wound Bed Granulation Amount: Large (67-100%) Exposed Structure Zahn, Laurin [name]. ([id number]) Granulation Quality: Pink Fascia Exposed: No Necrotic Amount: None Present (0%) Fat Layer Exposed: No Tendon Exposed: No Muscle Exposed: No Joint Exposed: No Bone Exposed: No Limited to Skin Breakdown Periwound Skin Texture Texture Color No Abnormalities Noted: No No Abnormalities Noted: No Erythema: Yes Moisture Erythema Location: Circumferential No Abnormalities Noted: No Moist: Yes Temperature / Pain Temperature: No Abnormality Tenderness on Palpation: Yes Wound Preparation Ulcer Cleansing: Rinsed/Irrigated with Saline Topical Anesthetic Applied: Other: lidocaine 4%, Treatment Notes Wound #7 (Left, Medial Lower Leg) 1. Cleansed with: Clean wound with Normal Saline Cleanse wound with antibacterial soap and water 2. Anesthetic Topical Lidocaine 4% cream to wound bed prior to debridement 5. Secondary Dressing Applied ABD Pad Dry Gauze 7. Secured with Tape 3 Layer Compression System - Bilateral Notes silver alginate Electronic Signature(s) Signed: 02/21/2016 5:37:51 PM By: Alric Quan Entered By: Alric Quan on 02/20/2016 14:58:00 [name] ([id number]) -------------------------------------------------------------------------------- Vitals Details Patient Name: [name] Date of Service: 02/20/2016 2:15 PM Medical Record Patient Account  Number: [id number] Number: Treating RN: Ahmed Prima 10/01/1950 (65 y.o. Other Clinician: Date of Birth/Sex: Female) Treating ROBSON, [place] Primary Care Physician: LADA, MELINDA Physician/Extender: G Referring Physician: LADA, MELINDA Weeks in Treatment: 13 Vital Signs Time Taken: 14:39 Temperature (F): 98.1 Height (in): 63 Pulse (bpm): 60 Weight (lbs): 270 Respiratory Rate (breaths/min): 20 Body Mass Index (BMI): 47.8 Blood Pressure (mmHg): 120/61 Reference Range: 80 - 120 mg / dl Electronic Signature(s) Signed: 02/21/2016 5:37:51 PM By: Alric Quan Entered By: Alric Quan on 02/20/2016 14:39:17

## 2016-02-22 NOTE — Progress Notes (Addendum)
MIREL, SURRATT ([id number]) Visit Report for 02/20/2016 Chief Complaint Document Details Patient Name: [name], [name] Date of Service: 02/20/2016 2:15 PM Medical Record Patient Account Number: [id number] Number: Treating RN: Ahmed Prima 02/11/1951 (65 y.o. Other Clinician: Date of Birth/Sex: Female) Treating Federica Allport Primary Care Physician/Extender: Peri Jefferson, [place] Physician: Referring Physician: LADA, MELINDA Weeks in Treatment: 13 Information Obtained from: Patient Chief Complaint Patient seen for complaints of Non-Healing Wound to the left and right lower extremity and the right dorsum of the foot Electronic Signature(s) Signed: 02/20/2016 5:12:34 PM By: Linton Ham MD Entered By: Linton Ham on 02/20/2016 15:20:52 [name] ([id number]) -------------------------------------------------------------------------------- Debridement Details Patient Name: [name] Date of Service: 02/20/2016 2:15 PM Medical Record Patient Account Number: [id number] Number: Treating RN: Ahmed Prima January 12, 1951 (65 y.o. Other Clinician: Date of Birth/Sex: Female) Treating Lillyanna Glandon Primary Care Physician/Extender: G LADA, [place] Physician: Referring Physician: LADA, MELINDA Weeks in Treatment: 13 Debridement Performed for Wound #3 Right Lower Leg Assessment: Performed By: Physician Ricard Dillon, MD Debridement: Debridement Pre-procedure Yes - 15:10 Verification/Time Out Taken: Start Time: 15:15 Pain Control: Lidocaine 4% Topical Solution Level: Skin/Subcutaneous Tissue Total Area Debrided (L x 1.7 (cm) x 1.6 (cm) = 2.72 (cm) W): Tissue and other Viable, Non-Viable, Exudate, Fibrin/Slough, Subcutaneous material debrided: Instrument: Curette Bleeding: Minimum Hemostasis Achieved: Pressure End Time: 15:16 Procedural Pain: 0 Post Procedural Pain: 0 Response to Treatment: Procedure was tolerated well Post Debridement  Measurements of Total Wound Length: (cm) 1.7 Width: (cm) 1.6 Depth: (cm) 0.3 Volume: (cm) 0.641 Character of Wound/Ulcer Post Requires Further Debridement Debridement: Severity of Tissue Post Debridement: Fat layer exposed Post Procedure Diagnosis Same as Pre-procedure Electronic Signature(s) Signed: 02/20/2016 5:12:34 PM By: Linton Ham MD [name] ([id number]) Signed: 02/21/2016 5:37:51 PM By: Alric Quan Entered By: Linton Ham on 02/20/2016 15:20:02 [name] ([id number]) -------------------------------------------------------------------------------- Debridement Details Patient Name: [name] Date of Service: 02/20/2016 2:15 PM Medical Record Patient Account Number: [id number] Number: Treating RN: Ahmed Prima May 04, 1950 (65 y.o. Other Clinician: Date of Birth/Sex: Female) Treating Zylan Almquist Primary Care Physician/Extender: G LADA, [place] Physician: Referring Physician: LADA, MELINDA Weeks in Treatment: 13 Debridement Performed for Wound #4 Left,Proximal,Lateral Lower Leg Assessment: Performed By: Physician Ricard Dillon, MD Debridement: Debridement Pre-procedure Yes - 15:10 Verification/Time Out Taken: Start Time: 15:11 Pain Control: Lidocaine 4% Topical Solution Level: Skin/Subcutaneous Tissue Total Area Debrided (L x 4.2 (cm) x 2.6 (cm) = 10.92 (cm) W): Tissue and other Viable, Non-Viable, Exudate, Fibrin/Slough, Subcutaneous material debrided: Instrument: Curette Bleeding: Minimum Hemostasis Achieved: Pressure End Time: 15:12 Procedural Pain: 0 Post Procedural Pain: 0 Response to Treatment: Procedure was tolerated well Post Debridement Measurements of Total Wound Length: (cm) 4.2 Width: (cm) 2.6 Depth: (cm) 0.2 Volume: (cm) 1.715 Character of Wound/Ulcer Post Requires Further Debridement Debridement: Severity of Tissue Post Debridement: Fat layer exposed Post Procedure Diagnosis Same as  Pre-procedure Electronic Signature(s) Signed: 02/20/2016 5:12:34 PM By: Linton Ham MD [name] ([id number]) Signed: 02/21/2016 5:37:51 PM By: Alric Quan Entered By: Linton Ham on 02/20/2016 15:20:17 [name] ([id number]) -------------------------------------------------------------------------------- Debridement Details Patient Name: [name] Date of Service: 02/20/2016 2:15 PM Medical Record Patient Account Number: [id number] Number: Treating RN: Ahmed Prima [birth year] (65 y.o. Other Clinician: Date of Birth/Sex: Female) Treating Obie Silos Primary Care Physician/Extender: Peri Jefferson, [place] Physician: Referring Physician: LADA, MELINDA Weeks in Treatment: 13 Debridement Performed for Wound #5 Left,Distal,Lateral Lower Leg Assessment: Performed By: Physician Ricard Dillon,  MD Debridement: Debridement Pre-procedure Yes - 15:10 Verification/Time Out Taken: Start Time: 15:13 Pain Control: Lidocaine 4% Topical Solution Level: Skin/Subcutaneous Tissue Total Area Debrided (L x 1.9 (cm) x 1.5 (cm) = 2.85 (cm) W): Tissue and other Viable, Non-Viable, Exudate, Fibrin/Slough, Subcutaneous material debrided: Instrument: Curette Bleeding: Minimum Hemostasis Achieved: Pressure End Time: 15:14 Procedural Pain: 0 Post Procedural Pain: 0 Response to Treatment: Procedure was tolerated well Post Debridement Measurements of Total Wound Length: (cm) 1.9 Width: (cm) 1.5 Depth: (cm) 0.3 Volume: (cm) 0.672 Character of Wound/Ulcer Post Requires Further Debridement Debridement: Severity of Tissue Post Debridement: Fat layer exposed Post Procedure Diagnosis Same as Pre-procedure Electronic Signature(s) Signed: 02/20/2016 5:12:34 PM By: Linton Ham MD [name] ([id number]) Signed: 02/21/2016 5:37:51 PM By: Alric Quan Entered By: Linton Ham on 02/20/2016 15:20:35 [name]  ([id number]) -------------------------------------------------------------------------------- HPI Details Patient Name: [name] Date of Service: 02/20/2016 2:15 PM Medical Record Patient Account Number: [id number] Number: Treating RN: Ahmed Prima November 17, 1950 (65 y.o. Other Clinician: Date of Birth/Sex: Female) Treating Tanesha Arambula Primary Care Physician/Extender: Peri Jefferson, [place] Physician: Referring Physician: LADA, MELINDA Weeks in Treatment: 13 History of Present Illness Location: left and right lower extremity and dorsum of the right foot Quality: Patient reports experiencing a sharp pain to affected area(s). Severity: Patient rates the pain to be a 8 out of 10 especially with palpation Duration: Patient has had the wound for > 6 months prior to seeking treatment at the wound center Timing: Pain in wound is Intermittent in severity but persistent Context: The wound appeared gradually over time Modifying Factors: Other treatment(s) tried include: symptomatic treatment as advised by her PCP Associated Signs and Symptoms: Patient reports having increase swelling iin her bilateral lower extremities HPI Description: 65 year old with a past medical history significant for MS, urinary incontinence, and obesity. She has been seen in the wound clinic before for lower extremity ulcerations treated with compression therapy. she is also known to have hypertension, peripheral vascular disease, COPD, obstructive sleep apnea, lumbar radiculopathy, kyphoscoliosis, urinary issues and tobacco abuse. Smokes a packet of cigarettes a day was recently seen at the [facility] for swelling of her legs and feet with a ulceration on the dorsum of the right foot which has been there for about 6 months. she was recently in the ER about a month ago where she was seen for shortness of breath and swelling of the legs and a chest x-ray was within normal limits had an  increase in her BNP and was given Lasix and put on doxycycline for a mild cellulitis and possible UTI. Wounds aresmaller today 11/13/15. Debrided and will continue Santyl. 12/21/2015 -- she was admitted to the hospital overnight on 12/18/2015 and diagnosed with urinary retention and cellulitis of the left lower leg. is past to take clindamycin and use Santyl for her wounds. 01/15/2016 -- come back to see [place] for almost a month and continues to be noncompliant with her dressings 01/30/16 patient presents today for a follow-up visit concerning her ongoing bilateral lower extremity wounds. We have not seen her for the past 2 weeks although we are supposed to be seen her for weekly visits. She currently has a Foley catheter. She tells me that the wounds are intensely painful especially with pressure and palpation at this point in time. Fortunately she is having no interval signs or symptoms of systemic infection but unfortunately the wounds have not improved dramatically since we last saw her. She does have home health coming  to take care of her as well. She is currently not in any compression wraps. 02/06/16 ON evaluation today patient continues to experience discomfort regarding her bilateral lower extremity ulcers. She has continued to tolerate the dressing changes at this point in time and continues to have a Foley catheter as well. Fortunately she is back this week in the past she has been somewhat noncompliant with follow-ups I'm glad to see her today. She tells me that her pain level varies but can be as [name], [name] H. ([id number]) high as a 7 out of 10 with manipulation of the wound. She tells me that she used to be on oxycodone which was managed by the pain clinic although she is no longer on that as she tells me that she was actually smoking marijuana at the time and when this was found out they discontinued her pain medication. She no longer is taking anything pain medication wise and she  also does not smoke cigarettes nor marijuana at this point. 02/12/16; this is a patient I don't believe I have previously seen. She has multiple sclerosis. She has 4 punched-out areas on the anterior lateral left leg 2 areas on the right these are all in the same condition. Reasonably small [dime size] wounds each was some depth. Surface of these wounds does not look particularly healthy as there is adherent slough. There is no evidence of surrounding infection or inflammation. ABIs in this clinic were 0.87 on the right and 0.81 on the left. She is listed as having PAD and is a smoker. Not a diabetic 02/20/16 patient I gave antibiotics to last week for erythema around both wound sites on the left lateral leg and right lateral leg. This appears to be a lot better. One of the areas on the left leg is healed however she still has 3 punched-out open areas on the left lateral calf and one on the right lateral calf and one on the dorsal foot. She is an ex-smoker quitting 1 month ago. ABIs in this clinic were 0.87 on the right 0.81 on the left Electronic Signature(s) Signed: 02/20/2016 5:12:34 PM By: Linton Ham MD Entered By: Linton Ham on 02/20/2016 15:22:42 [name] ([id number]) -------------------------------------------------------------------------------- Physical Exam Details Patient Name: [name] Date of Service: 02/20/2016 2:15 PM Medical Record Patient Account Number: [id number] Number: Treating RN: Ahmed Prima [birth year] (65 y.o. Other Clinician: Date of Birth/Sex: Female) Treating Velora Horstman Primary Care Physician/Extender: Peri Jefferson, [place] Physician: Referring Physician: LADA, MELINDA Weeks in Treatment: 13 Constitutional Sitting or standing Blood Pressure is within target range for patient.. Pulse regular and within target range for patient.Marland Kitchen Respirations regular, non-labored and within target range.. Temperature is normal and  within the target range for the patient.. Patient's appearance is neat and clean. Appears in no acute distress. Well nourished and well developed.. Eyes Conjunctivae clear. No discharge.. Notes Wound exam; the patient has 3 punched out open areas on the left, down from 4 last week this inferior one is healed. The more recent right anterior lateral wound is roughly the same as last time small wound on the right forefoot dorsally Electronic Signature(s) Signed: 02/20/2016 5:12:34 PM By: Linton Ham MD Entered By: Linton Ham on 02/20/2016 15:24:34 [name] ([id number]) -------------------------------------------------------------------------------- Physician Orders Details Patient Name: [name] Date of Service: 02/20/2016 2:15 PM Medical Record Patient Account Number: [id number] Number: Treating RN: Ahmed Prima [birth year] (65 y.o. Other Clinician: Date of Birth/Sex: Female) Treating Saloni Lablanc Primary Care Physician/Extender:  G LADA, [place] Physician: Referring Physician: LADA, MELINDA Weeks in Treatment: 35 Verbal / Phone Orders: Yes Clinician: Pinkerton, Debi Read Back and Verified: Yes Diagnosis Coding Wound Cleansing Wound #2 Right,Dorsal Foot o Cleanse wound with mild soap and water - HHRN to wash legs with soap and water o No tub bath. Wound #3 Right Lower Leg o Cleanse wound with mild soap and water - HHRN to wash legs with soap and water o No tub bath. Wound #4 Left,Proximal,Lateral Lower Leg o Cleanse wound with mild soap and water - HHRN to wash legs with soap and water o No tub bath. Wound #5 Left,Distal,Lateral Lower Leg o Cleanse wound with mild soap and water - HHRN to wash legs with soap and water o No tub bath. Wound #7 Left,Medial Lower Leg o Cleanse wound with mild soap and water - HHRN to wash legs with soap and water o No tub bath. Anesthetic Wound #2 Right,Dorsal Foot o Topical Lidocaine  4% cream applied to wound bed prior to debridement - in clinic only Wound #3 Right Lower Leg o Topical Lidocaine 4% cream applied to wound bed prior to debridement - in clinic only Wound #4 Left,Proximal,Lateral Lower Leg o Topical Lidocaine 4% cream applied to wound bed prior to debridement - in clinic only Wound #5 Left,Distal,Lateral Lower Leg o Topical Lidocaine 4% cream applied to wound bed prior to debridement - in clinic only Wound #7 Left,Medial Lower Leg [name], [name] H. ([id number]) o Topical Lidocaine 4% cream applied to wound bed prior to debridement - in clinic only Primary Wound Dressing Wound #2 Right,Dorsal Foot o Aquacel Ag - or equivalent o [place] Ag - in clinic Wound #3 Right Lower Leg o Aquacel Ag - or equivalent o [place] Ag - in clinic Wound #4 Left,Proximal,Lateral Lower Leg o Aquacel Ag - or equivalent o Sorbal[place] Ag - IN CLINIC Wound #5 Left,Distal,Lateral Lower Leg o Aquacel Ag - or equivalent o [place] Ag - IN CLINIC Wound #7 Left,Medial Lower Leg o Aquacel Ag - or equivalent o [place] Ag - IN CLINIC Secondary Dressing Wound #2 Right,Dorsal Foot o ABD pad o Dry Gauze Wound #3 Right Lower Leg o ABD pad o Dry Gauze Wound #4 Left,Proximal,Lateral Lower Leg o ABD pad o Dry Gauze Wound #5 Left,Distal,Lateral Lower Leg o ABD pad o Dry Gauze Wound #7 Left,Medial Lower Leg o ABD pad o Dry Gauze Dressing Change Frequency Wound #2 Right,Dorsal Foot o Three times weekly - [facility] to change Monday and Friday Pt comes to clinic Wednesday AMERICUS, HERSON. ([id number]) Wound #3 Right Lower Leg o Three times weekly - [facility] to change Monday and Friday Pt comes to clinic Wednesday Wound #4 Left,Proximal,Lateral Lower Leg o Three times weekly - [facility] to change Monday and Friday Pt comes to clinic Wednesday Wound #5 Left,Distal,Lateral Lower Leg o Three times weekly - [facility] to change Monday and Friday Pt  comes to clinic Wednesday Wound #7 Left,Medial Lower Leg o Three times weekly - [facility] to change Monday and Friday Pt comes to clinic Wednesday Follow-up Appointments Wound #2 Right,Dorsal Foot o Return Appointment in 1 week. Wound #3 Right Lower Leg o Return Appointment in 1 week. Wound #4 Left,Proximal,Lateral Lower Leg o Return Appointment in 1 week. Wound #5 Left,Distal,Lateral Lower Leg o Return Appointment in 1 week. Wound #7 Left,Medial Lower Leg o Return Appointment in 1 week. Edema Control Wound #2 Right,Dorsal Foot o 3 Layer Compression System - Bilateral - UNNA TO ANCHOR o Patient to wear  own compression stockings - HHRN please order compression hose for patient 20-15mmHg o Elevate legs to the level of the heart and pump ankles as often as possible Wound #3 Right Lower Leg o 3 Layer Compression System - Bilateral - UNNA TO ANCHOR o Patient to wear own compression stockings - HHRN please order compression hose for patient 20-27mmHg o Elevate legs to the level of the heart and pump ankles as often as possible Wound #4 Left,Proximal,Lateral Lower Leg o 3 Layer Compression System - Bilateral - UNNA TO ANCHOR Ransier, [name] ([id number]) o Patient to wear own compression stockings - HHRN please order compression hose for patient 20-35mmHg o Elevate legs to the level of the heart and pump ankles as often as possible Wound #5 Left,Distal,Lateral Lower Leg o 3 Layer Compression System - Bilateral - UNNA TO ANCHOR o Patient to wear own compression stockings - HHRN please order compression hose for patient 20-53mmHg o Elevate legs to the level of the heart and pump ankles as often as possible Wound #7 Left,Medial Lower Leg o 3 Layer Compression System - Bilateral - UNNA TO ANCHOR o Patient to wear own compression stockings - HHRN please order compression hose for patient 20-21mmHg o Elevate legs to the level of the heart and pump ankles  as often as possible Additional Orders / Instructions Wound #2 Right,Dorsal Foot o Stop Smoking o Increase protein intake. Wound #3 Right Lower Leg o Stop Smoking o Increase protein intake. Wound #4 Left,Proximal,Lateral Lower Leg o Stop Smoking o Increase protein intake. Wound #5 Left,Distal,Lateral Lower Leg o Stop Smoking o Increase protein intake. Wound #7 Left,Medial Lower Leg o Stop Smoking o Increase protein intake. Home Health Wound #2 [place] Visits - [facility]-----[facility] to change wraps Monday and Friday ********[facility] please order compression hose for patient 20-70mmHg********* o Home Health Nurse may visit PRN to address patientos wound care needs. o FACE TO FACE ENCOUNTER: MEDICARE and MEDICAID PATIENTS: I certify that this patient is under my care and that I had a face-to-face encounter that meets the physician face-to-face encounter requirements with this patient on this date. The encounter with the patient was in whole or in part for the following MEDICAL CONDITION: (primary reason for [place]) MEDICAL NECESSITY: I certify, that based on my findings, NURSING services are a medically necessary home health service. HOME BOUND STATUS: I certify that my clinical findings DEADRA, GARZON. ([id number]) support that this patient is homebound (i.e., Due to illness or injury, pt requires aid of supportive devices such as crutches, cane, wheelchairs, walkers, the use of special transportation or the assistance of another person to leave their place of residence. There is a normal inability to leave the home and doing so requires considerable and taxing effort. Other absences are for medical reasons / religious services and are infrequent or of short duration when for other reasons). o If current dressing causes regression in wound condition, may D/C ordered dressing product/s and apply Normal Saline Moist Dressing  daily until next [place] / Other MD appointment. [place] of regression in wound condition at [phone]. o Please direct any NON-WOUND related issues/requests for orders to patient's Primary Care Physician Wound #3 Right Lower Leg o [place] Visits - [facility]---[facility] to change wraps Monday and Friday *********[facility] please order compression hose for patient 20-57mmHg********* o Home Health Nurse may visit PRN to address patientos wound care needs. o FACE TO FACE ENCOUNTER: MEDICARE and MEDICAID PATIENTS:  I certify that this patient is under my care and that I had a face-to-face encounter that meets the physician face-to-face encounter requirements with this patient on this date. The encounter with the patient was in whole or in part for the following MEDICAL CONDITION: (primary reason for [place]) MEDICAL NECESSITY: I certify, that based on my findings, NURSING services are a medically necessary home health service. HOME BOUND STATUS: I certify that my clinical findings support that this patient is homebound (i.e., Due to illness or injury, pt requires aid of supportive devices such as crutches, cane, wheelchairs, walkers, the use of special transportation or the assistance of another person to leave their place of residence. There is a normal inability to leave the home and doing so requires considerable and taxing effort. Other absences are for medical reasons / religious services and are infrequent or of short duration when for other reasons). o If current dressing causes regression in wound condition, may D/C ordered dressing product/s and apply Normal Saline Moist Dressing daily until next [place] / Other MD appointment. [place] of regression in wound condition at [phone]. o Please direct any NON-WOUND related issues/requests for orders to patient's Primary Care Physician Wound #4  Left,Proximal,Lateral Lower Leg o [place] Visits - [facility]---[facility] to change wraps Monday and Friday *********[facility] please order compression hose for patient 20-13mmHg********* o Home Health Nurse may visit PRN to address patientos wound care needs. o FACE TO FACE ENCOUNTER: MEDICARE and MEDICAID PATIENTS: I certify that this patient is under my care and that I had a face-to-face encounter that meets the physician face-to-face encounter requirements with this patient on this date. The encounter with the patient was in whole or in part for the following MEDICAL CONDITION: (primary reason for [place]) MEDICAL NECESSITY: I certify, that based on my findings, NURSING services are a medically necessary home health service. HOME BOUND STATUS: I certify that my clinical findings support that this patient is homebound (i.e., Due to illness or injury, pt requires aid of supportive devices such as crutches, cane, wheelchairs, walkers, the use of special transportation or the assistance of another person to leave their place of residence. There is a normal inability to leave the home and doing so requires considerable and taxing effort. Other absences are for medical reasons / religious services and are infrequent or of short duration when for other reasons). ALVIE, NEHMER ([id number]) o If current dressing causes regression in wound condition, may D/C ordered dressing product/s and apply Normal Saline Moist Dressing daily until next [place] / Other MD appointment. [place] of regression in wound condition at [phone]. o Please direct any NON-WOUND related issues/requests for orders to patient's Primary Care Physician Wound #5 Left,Distal,Lateral Lower Leg o [place] Visits - [facility]---[facility] to change wraps Monday and Friday *********[facility] please order compression hose for patient 20-64mmHg********* o Home Health Nurse may  visit PRN to address patientos wound care needs. o FACE TO FACE ENCOUNTER: MEDICARE and MEDICAID PATIENTS: I certify that this patient is under my care and that I had a face-to-face encounter that meets the physician face-to-face encounter requirements with this patient on this date. The encounter with the patient was in whole or in part for the following MEDICAL CONDITION: (primary reason for [place]) MEDICAL NECESSITY: I certify, that based on my findings, NURSING services are a medically necessary home health service. HOME BOUND STATUS: I certify that my clinical findings support  that this patient is homebound (i.e., Due to illness or injury, pt requires aid of supportive devices such as crutches, cane, wheelchairs, walkers, the use of special transportation or the assistance of another person to leave their place of residence. There is a normal inability to leave the home and doing so requires considerable and taxing effort. Other absences are for medical reasons / religious services and are infrequent or of short duration when for other reasons). o If current dressing causes regression in wound condition, may D/C ordered dressing product/s and apply Normal Saline Moist Dressing daily until next [place] / Other MD appointment. Preston of regression in wound condition at [phone]. o Please direct any NON-WOUND related issues/requests for orders to patient's Primary Care Physician Wound #7 Left,Medial Lower Leg o [place] Visits - [facility]---[facility] to change wraps Monday and Friday ********[facility] please order compression hose for patient 20-59mmHg********* o Home Health Nurse may visit PRN to address patientos wound care needs. o FACE TO FACE ENCOUNTER: MEDICARE and MEDICAID PATIENTS: I certify that this patient is under my care and that I had a face-to-face encounter that meets the physician face-to-face encounter requirements  with this patient on this date. The encounter with the patient was in whole or in part for the following MEDICAL CONDITION: (primary reason for [place]) MEDICAL NECESSITY: I certify, that based on my findings, NURSING services are a medically necessary home health service. HOME BOUND STATUS: I certify that my clinical findings support that this patient is homebound (i.e., Due to illness or injury, pt requires aid of supportive devices such as crutches, cane, wheelchairs, walkers, the use of special transportation or the assistance of another person to leave their place of residence. There is a normal inability to leave the home and doing so requires considerable and taxing effort. Other absences are for medical reasons / religious services and are infrequent or of short duration when for other reasons). o If current dressing causes regression in wound condition, may D/C ordered dressing product/s and apply Normal Saline Moist Dressing daily until next [place] / Other MD appointment. [place] of regression in wound condition at [phone]. o Please direct any NON-WOUND related issues/requests for orders to patient's Primary Care Physician [name], [name] ([id number]) Services and Therapies o Arterial Studies- Bilateral - Dr. Tyrell Antonio office o Ankle Brachial Index (ABI) - Dr. Tyrell Antonio office o Toe pressures (TBI) - Dr. Tyrell Antonio office Electronic Signature(s) Signed: 02/20/2016 5:12:34 PM By: Linton Ham MD Signed: 02/21/2016 5:37:51 PM By: Alric Quan Entered By: Alric Quan on 02/20/2016 16:33:02 [name] ([id number]) -------------------------------------------------------------------------------- Problem List Details Patient Name: [name] Date of Service: 02/20/2016 2:15 PM Medical Record Patient Account Number: [id number] Number: Treating RN: Ahmed Prima 1950-07-20 (65 y.o. 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 11/20/2015 Yes exposed F17.218 Nicotine dependence, cigarettes, with other nicotine- 11/20/2015 Yes induced disorders Inactive Problems Resolved Problems Electronic Signature(s) Signed: 02/20/2016 5:12:34 PM By: Linton Ham MD Entered By: Dellia Nims,  Braxston Quinter on 02/20/2016 15:17:19 LATALIA, DENTE ([id number]) KAILIN, BAKOS ([id number]) -------------------------------------------------------------------------------- Progress Note Details Patient Name: JERRELL, ADLEY Date of Service: 02/20/2016 2:15 PM Medical Record Patient Account Number: [id number] Number: Treating RN: Ahmed Prima 1951/02/27 (65 y.o. Other Clinician: Date of Birth/Sex: Female) Treating Blayklee Mable Primary Care Physician/Extender: G LADA, Carrick Physician: Referring Physician: LADA, MELINDA Weeks in Treatment: 13 Subjective Chief Complaint Information obtained from Patient Patient seen for complaints of Non-Healing Wound to the left and right lower extremity and the right dorsum of the foot History of Present Illness (HPI) The following HPI elements were documented for the patient's wound: Location: left and right lower extremity and dorsum of the right foot Quality: Patient reports experiencing a sharp pain to affected area(s). Severity: Patient rates the pain to be a 8 out of 10 especially with  palpation Duration: Patient has had the wound for > 6 months prior to seeking treatment at the wound center Timing: Pain in wound is Intermittent in severity but persistent Context: The wound appeared gradually over time Modifying Factors: Other treatment(s) tried include: symptomatic treatment as advised by her PCP Associated Signs and Symptoms: Patient reports having increase swelling iin her bilateral lower extremities 65 year old with a past medical history significant for MS, urinary incontinence, and obesity. She has been seen in the wound clinic before for lower extremity ulcerations treated with compression therapy. she is also known to have hypertension, peripheral vascular disease, COPD, obstructive sleep apnea, lumbar radiculopathy, kyphoscoliosis, urinary issues and tobacco abuse. Smokes a packet of cigarettes a day was recently seen at the [facility] for swelling of her legs and feet with a ulceration on the dorsum of the right foot which has been there for about 6 months. she was recently in the ER about a month ago where she was seen for shortness of breath and swelling of the legs and a chest x-ray was within normal limits had an increase in her BNP and was given Lasix and put on doxycycline for a mild cellulitis and possible UTI. Wounds aresmaller today 11/13/15. Debrided and will continue Santyl. 12/21/2015 -- she was admitted to the hospital overnight on 12/18/2015 and diagnosed with urinary retention and cellulitis of the left lower leg. is past to take clindamycin and use Santyl for her wounds. 01/15/2016 -- come back to see [place] for almost a month and continues to be noncompliant with her dressings 01/30/16 patient presents today for a follow-up visit concerning her ongoing bilateral lower extremity wounds. We have not seen her for the past 2 weeks although we are supposed to be seen her for weekly visits. She currently has a Foley catheter. She tells me that  the wounds are intensely painful especially with pressure and palpation at this point in time. Fortunately she is having no interval signs or symptoms of systemic Koos, Attallah H. ([id number]) infection but unfortunately the wounds have not improved dramatically since we last saw her. She does have home health coming to take care of her as well. She is currently not in any compression wraps. 02/06/16 ON evaluation today patient continues to experience discomfort regarding her bilateral lower extremity ulcers. She has continued to tolerate the dressing changes at this point in time and continues to have a Foley catheter as well. Fortunately she is back this week in the past she has been somewhat noncompliant with follow-ups I'm glad to see her today. She tells me that her pain level varies but can be as high as  a 7 out of 10 with manipulation of the wound. She tells me that she used to be on oxycodone which was managed by the pain clinic although she is no longer on that as she tells me that she was actually smoking marijuana at the time and when this was found out they discontinued her pain medication. She no longer is taking anything pain medication wise and she also does not smoke cigarettes nor marijuana at this point. 02/12/16; this is a patient I don't believe I have previously seen. She has multiple sclerosis. She has 4 punched-out areas on the anterior lateral left leg 2 areas on the right these are all in the same condition. Reasonably small [dime size] wounds each was some depth. Surface of these wounds does not look particularly healthy as there is adherent slough. There is no evidence of surrounding infection or inflammation. ABIs in this clinic were 0.87 on the right and 0.81 on the left. She is listed as having PAD and is a smoker. Not a diabetic 02/20/16 patient I gave antibiotics to last week for erythema around both wound sites on the left lateral leg and right lateral leg. This  appears to be a lot better. One of the areas on the left leg is healed however she still has 3 punched-out open areas on the left lateral calf and one on the right lateral calf and one on the dorsal foot. She is an ex-smoker quitting 1 month ago. ABIs in this clinic were 0.87 on the right 0.81 on the left Objective Constitutional Sitting or standing Blood Pressure is within target range for patient.. Pulse regular and within target range for patient.Marland Kitchen Respirations regular, non-labored and within target range.. Temperature is normal and within the target range for the patient.. Patient's appearance is neat and clean. Appears in no acute distress. Well nourished and well developed.. Vitals Time Taken: 2:39 PM, Height: 63 in, Weight: 270 lbs, BMI: 47.8, Temperature: 98.1 F, Pulse: 60 bpm, Respiratory Rate: 20 breaths/min, Blood Pressure: 120/61 mmHg. Eyes Conjunctivae clear. No discharge.. General Notes: Wound exam; the patient has 3 punched out open areas on the left, down from 4 last week this inferior one is healed. The more recent right anterior lateral wound is roughly the same as last time small wound on the right forefoot dorsally Integumentary (Hair, Skin) Delaine, Effa H. ([id number]) Wound #2 status is Open. Original cause of wound was Trauma. The wound is located on the Right,Dorsal Foot. The wound measures 0.3cm length x 0.5cm width x 0.1cm depth; 0.118cm^2 area and 0.012cm^3 volume. The wound is limited to skin breakdown. There is no tunneling or undermining noted. There is a medium amount of serous drainage noted. The wound margin is flat and intact. There is no granulation within the wound bed. There is a large (67-100%) amount of necrotic tissue within the wound bed including Eschar. The periwound skin appearance exhibited: Localized Edema, Ecchymosis, Erythema. The periwound skin appearance did not exhibit: Callus, Crepitus, Excoriation, Fluctuance, Friable, Induration, Rash,  Scarring, Dry/Scaly, Maceration, Moist, Atrophie Blanche, Cyanosis, Hemosiderin Staining, Mottled, Pallor, Rubor. The surrounding wound skin color is noted with erythema which is circumferential. Periwound temperature was noted as No Abnormality. The periwound has tenderness on palpation. Wound #3 status is Open. Original cause of wound was Trauma. The wound is located on the Right Lower Leg. The wound measures 1.7cm length x 1.6cm width x 0.2cm depth; 2.136cm^2 area and 0.427cm^3 volume. The wound is limited to skin breakdown. There is no  tunneling or undermining noted. There is a large amount of serous drainage noted. The wound margin is flat and intact. There is no granulation within the wound bed. There is a large (67-100%) amount of necrotic tissue within the wound bed including Adherent Slough. The periwound skin appearance exhibited: Erythema. The periwound skin appearance did not exhibit: Callus, Crepitus, Excoriation, Fluctuance, Friable, Induration, Localized Edema, Rash, Scarring, Dry/Scaly, Maceration, Moist, Atrophie Blanche, Cyanosis, Ecchymosis, Hemosiderin Staining, Mottled, Pallor, Rubor. The surrounding wound skin color is noted with erythema which is circumferential. Periwound temperature was noted as No Abnormality. The periwound has tenderness on palpation. Wound #4 status is Open. Original cause of wound was Trauma. The wound is located on the Left,Proximal,Lateral Lower Leg. The wound measures 4.2cm length x 2.6cm width x 0.2cm depth; 8.577cm^2 area and 1.715cm^3 volume. The wound is limited to skin breakdown. There is no tunneling or undermining noted. There is a large amount of serous drainage noted. The wound margin is flat and intact. There is small (1-33%) pale granulation within the wound bed. There is a large (67-100%) amount of necrotic tissue within the wound bed including Adherent Slough. The periwound skin appearance exhibited: Excoriation, Moist, Erythema. The  periwound skin appearance did not exhibit: Callus, Crepitus, Fluctuance, Friable, Induration, Localized Edema, Rash, Scarring, Dry/Scaly, Maceration, Atrophie Blanche, Cyanosis, Ecchymosis, Hemosiderin Staining, Mottled, Pallor, Rubor. The surrounding wound skin color is noted with erythema which is circumferential. Periwound temperature was noted as No Abnormality. The periwound has tenderness on palpation. Wound #5 status is Open. Original cause of wound was Gradually Appeared. The wound is located on the Left,Distal,Lateral Lower Leg. The wound measures 1.9cm length x 1.5cm width x 0.3cm depth; 2.238cm^2 area and 0.672cm^3 volume. The wound is limited to skin breakdown. There is no tunneling or undermining noted. There is a large amount of serous drainage noted. The wound margin is flat and intact. There is no granulation within the wound bed. There is a large (67-100%) amount of necrotic tissue within the wound bed including Adherent Slough. The periwound skin appearance exhibited: Moist, Erythema. The periwound skin appearance did not exhibit: Callus, Crepitus, Excoriation, Fluctuance, Friable, Induration, Localized Edema, Rash, Scarring, Dry/Scaly, Maceration, Atrophie Blanche, Cyanosis, Ecchymosis, Hemosiderin Staining, Mottled, Pallor, Rubor. The surrounding wound skin color is noted with erythema which is circumferential. Periwound temperature was noted as No Abnormality. The periwound has tenderness on palpation. Wound #6 status is Open. Original cause of wound was Gradually Appeared. The wound is located on the Left,Anterior Lower Leg. The wound measures 0cm length x 0cm width x 0cm depth; 0cm^2 area and 0cm^3 volume. The wound is limited to skin breakdown. There is no tunneling or undermining noted. There is a none present amount of drainage noted. The wound margin is flat and intact. There is no granulation within the wound bed. There is no necrotic tissue within the wound bed. The  periwound skin appearance did [name], [name] H. ([id number]) not exhibit: Callus, Crepitus, Excoriation, Fluctuance, Friable, Induration, Localized Edema, Rash, Scarring, Dry/Scaly, Maceration, Moist, Atrophie Blanche, Cyanosis, Ecchymosis, Hemosiderin Staining, Mottled, Pallor, Rubor, Erythema. Wound #7 status is Open. Original cause of wound was Gradually Appeared. The wound is located on the Left,Medial Lower Leg. The wound measures 0.7cm length x 1cm width x 0.1cm depth; 0.55cm^2 area and 0.055cm^3 volume. The wound is limited to skin breakdown. There is no tunneling or undermining noted. There is a large amount of serous drainage noted. The wound margin is flat and intact. There is large (67- 100%) pink  granulation within the wound bed. There is no necrotic tissue within the wound bed. The periwound skin appearance exhibited: Moist, Erythema. The surrounding wound skin color is noted with erythema which is circumferential. Periwound temperature was noted as No Abnormality. The periwound has tenderness on palpation. 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 There was a Skin/Subcutaneous Tissue Debridement [id number] debridement with total area of 2.72 sq cm performed by Ricard Dillon, MD. with the following instrument(s): Curette to remove Viable and Non-Viable tissue/material including Exudate, Fibrin/Slough, and Subcutaneous after achieving pain control using Lidocaine 4% Topical Solution. A time out was conducted at  15:10, prior to the start of the procedure. A Minimum amount of bleeding was controlled with Pressure. The procedure was tolerated well with a pain level of 0 throughout and a pain level of 0 following the procedure. Post Debridement Measurements: 1.7cm length x 1.6cm width x 0.3cm depth; 0.641cm^3 volume. Character of Wound/Ulcer Post Debridement requires further debridement. Severity of Tissue Post Debridement is: Fat layer exposed. Post procedure Diagnosis Wound #3: Same as Pre-Procedure Wound #4 [name], [name] H. ([id number]) Wound #4 is a Venous Leg Ulcer located on the Left,Proximal,Lateral Lower Leg . There was a Skin/Subcutaneous Tissue Debridement [id number] debridement with total area of 10.92 sq cm performed by Ricard Dillon, MD. with the following instrument(s): Curette to remove Viable and Non-Viable tissue/material including Exudate, Fibrin/Slough, and Subcutaneous after achieving pain control using Lidocaine 4% Topical Solution. A time out was conducted at 15:10, prior to the start of the procedure. A Minimum amount of bleeding was controlled with Pressure. The procedure was tolerated well with a pain level of 0 throughout and a pain level of 0 following the procedure. Post Debridement Measurements: 4.2cm length x 2.6cm width x 0.2cm depth; 1.715cm^3 volume. Character of Wound/Ulcer Post Debridement requires further debridement. Severity of Tissue Post Debridement is: Fat layer exposed. Post procedure Diagnosis Wound #4: Same as Pre-Procedure Wound #5 Wound #5 is a Venous Leg Ulcer located on the Left,Distal,Lateral Lower Leg . There was a Skin/Subcutaneous Tissue Debridement [id number]) debridement with total area of 2.85 sq cm performed by Ricard Dillon, MD. with the following instrument(s): Curette to remove Viable and Non-Viable tissue/material including Exudate, Fibrin/Slough, and Subcutaneous after achieving pain control using Lidocaine 4% Topical Solution. A  time out was conducted at 15:10, prior to the start of the procedure. A Minimum amount of bleeding was controlled with Pressure. The procedure was tolerated well with a pain level of 0 throughout and a pain level of 0 following the procedure. Post Debridement Measurements: 1.9cm length x 1.5cm width x 0.3cm depth; 0.672cm^3 volume. Character of Wound/Ulcer Post Debridement requires further debridement. Severity of Tissue Post Debridement is: Fat layer exposed. Post procedure Diagnosis Wound #5: Same as Pre-Procedure Plan Wound Cleansing: Wound #2 Right,Dorsal Foot: Cleanse wound with mild soap and water - HHRN to wash legs with soap and water No tub bath. Wound #3 Right Lower Leg: Cleanse wound with mild soap and water - HHRN to wash legs with soap and water No tub bath. Wound #  4 Left,Proximal,Lateral Lower Leg: Cleanse wound with mild soap and water - HHRN to wash legs with soap and water No tub bath. Wound #5 Left,Distal,Lateral Lower Leg: Cleanse wound with mild soap and water - HHRN to wash legs with soap and water No tub bath. Wound #7 Left,Medial Lower Leg: Cleanse wound with mild soap and water - HHRN to wash legs with soap and water No tub bath. Anesthetic: [name], [name] ([id number]) Wound #2 Right,Dorsal Foot: Topical Lidocaine 4% cream applied to wound bed prior to debridement - in clinic only Wound #3 Right Lower Leg: Topical Lidocaine 4% cream applied to wound bed prior to debridement - in clinic only Wound #4 Left,Proximal,Lateral Lower Leg: Topical Lidocaine 4% cream applied to wound bed prior to debridement - in clinic only Wound #5 Left,Distal,Lateral Lower Leg: Topical Lidocaine 4% cream applied to wound bed prior to debridement - in clinic only Wound #7 Left,Medial Lower Leg: Topical Lidocaine 4% cream applied to wound bed prior to debridement - in clinic only Primary Wound Dressing: Wound #2 Right,Dorsal Foot: Aquacel Ag - or equivalent Sorbal[place] Ag - in  clinic Wound #3 Right Lower Leg: Aquacel Ag - or equivalent Sorbal[place] Ag - in clinic Wound #4 Left,Proximal,Lateral Lower Leg: Aquacel Ag - or equivalent Sor[place] Ag - IN CLINIC Wound #5 Left,Distal,Lateral Lower Leg: Aquacel Ag - or equivalent [place] Ag - IN CLINIC Wound #7 Left,Medial Lower Leg: Aquacel Ag - or equivalent [place] Ag - IN CLINIC Secondary Dressing: Wound #2 Right,Dorsal Foot: ABD pad Dry Gauze Wound #3 Right Lower Leg: ABD pad Dry Gauze Wound #4 Left,Proximal,Lateral Lower Leg: ABD pad Dry Gauze Wound #5 Left,Distal,Lateral Lower Leg: ABD pad Dry Gauze Wound #7 Left,Medial Lower Leg: ABD pad Dry Gauze Dressing Change Frequency: Wound #2 Right,Dorsal Foot: Three times weekly - [facility] to change Monday and Friday Pt comes to clinic Wednesday Wound #3 Right Lower Leg: Three times weekly - [facility] to change Monday and Friday Pt comes to clinic Wednesday Wound #4 Left,Proximal,Lateral Lower Leg: Three times weekly - [facility] to change Monday and Friday Pt comes to clinic Wednesday Wound #5 Left,Distal,Lateral Lower Leg: Three times weekly - [facility] to change Monday and Friday Pt comes to clinic Wednesday [name], SCHROFF. ([id number]) Wound #7 Left,Medial Lower Leg: Three times weekly - [facility] to change Monday and Friday Pt comes to clinic Wednesday Follow-up Appointments: Wound #2 Right,Dorsal Foot: Return Appointment in 1 week. Wound #3 Right Lower Leg: Return Appointment in 1 week. Wound #4 Left,Proximal,Lateral Lower Leg: Return Appointment in 1 week. Wound #5 Left,Distal,Lateral Lower Leg: Return Appointment in 1 week. Wound #7 Left,Medial Lower Leg: Return Appointment in 1 week. Edema Control: Wound #2 Right,Dorsal Foot: 3 Layer Compression System - Bilateral - UNNA TO ANCHOR Patient to wear own compression stockings - HHRN please order compression hose for patient 20- 14mmHg Elevate legs to the level of the heart and pump ankles as often as possible Wound  #3 Right Lower Leg: 3 Layer Compression System - Bilateral - UNNA TO ANCHOR Patient to wear own compression stockings - HHRN please order compression hose for patient 20- 67mmHg Elevate legs to the level of the heart and pump ankles as often as possible Wound #4 Left,Proximal,Lateral Lower Leg: 3 Layer Compression System - Bilateral - UNNA TO ANCHOR Patient to wear own compression stockings - HHRN please order compression hose for patient 20- 19mmHg Elevate legs to the level of the heart and pump ankles as often as possible Wound #5 Left,Distal,Lateral Lower  Leg: 3 Layer Compression System - Bilateral - UNNA TO ANCHOR Patient to wear own compression stockings - HHRN please order compression hose for patient 20- 64mmHg Elevate legs to the level of the heart and pump ankles as often as possible Wound #7 Left,Medial Lower Leg: 3 Layer Compression System - Bilateral - UNNA TO ANCHOR Patient to wear own compression stockings - HHRN please order compression hose for patient 20- 47mmHg Elevate legs to the level of the heart and pump ankles as often as possible Additional Orders / Instructions: Wound #2 Right,Dorsal Foot: Stop Smoking Increase protein intake. Wound #3 Right Lower Leg: Stop Smoking Increase protein intake. Wound #4 Left,Proximal,Lateral Lower Leg: Stop Smoking Increase protein intake. Wound #5 Left,Distal,Lateral Lower Leg: Stop Smoking [name], [name] H. ([id number]) Increase protein intake. Wound #7 Left,Medial Lower Leg: Stop Smoking Increase protein intake. Home Health: Wound #2 Right,Dorsal Foot: [place] Visits - Advanced-----[facility] to change wraps Monday and Friday ********[facility] please order compression hose for patient 20-29mmHg********* Home Health Nurse may visit PRN to address patient s wound care needs. FACE TO FACE ENCOUNTER: MEDICARE and MEDICAID PATIENTS: I certify that this patient is under my care and that I had a face-to-face encounter that  meets the physician face-to-face encounter requirements with this patient on this date. The encounter with the patient was in whole or in part for the following MEDICAL CONDITION: (primary reason for [place]) MEDICAL NECESSITY: I certify, that based on my findings, NURSING services are a medically necessary home health service. HOME BOUND STATUS: I certify that my clinical findings support that this patient is homebound (i.e., Due to illness or injury, pt requires aid of supportive devices such as crutches, cane, wheelchairs, walkers, the use of special transportation or the assistance of another person to leave their place of residence. There is a normal inability to leave the home and doing so requires considerable and taxing effort. Other absences are for medical reasons / religious services and are infrequent or of short duration when for other reasons). If current dressing causes regression in wound condition, may D/C ordered dressing product/s and apply Normal Saline Moist Dressing daily until next [facility] / Other MD appointment. [place] of regression in wound condition at [phone]. Please direct any NON-WOUND related issues/requests for orders to patient's Primary Care Physician Wound #3 Right Lower Leg: [place] [facility] to change wraps Monday and Friday *********[facility] please order compression hose for patient 20-93mmHg********* Home Health Nurse may visit PRN to address patient s wound care needs. FACE TO FACE ENCOUNTER: MEDICARE and MEDICAID PATIENTS: I certify that this patient is under my care and that I had a face-to-face encounter that meets the physician face-to-face encounter requirements with this patient on this date. The encounter with the patient was in whole or in part for the following MEDICAL CONDITION: (primary reason for [place]) MEDICAL NECESSITY: I certify, that based on my findings, NURSING  services are a medically necessary home health service. HOME BOUND STATUS: I certify that my clinical findings support that this patient is homebound (i.e., Due to illness or injury, pt requires aid of supportive devices such as crutches, cane, wheelchairs, walkers, the use of special transportation or the assistance of another person to leave their place of residence. There is a normal inability to leave the home and doing so requires considerable and taxing effort. Other absences are for medical reasons / religious services and are infrequent or of short duration when  for other reasons). If current dressing causes regression in wound condition, may D/C ordered dressing product/s and apply Normal Saline Moist Dressing daily until next [place] / Other MD appointment. [place] of regression in wound condition at [phone]. Please direct any NON-WOUND related issues/requests for orders to patient's Primary Care Physician Wound #4 Left,Proximal,Lateral Lower Leg: [place] [facility] to change wraps Monday and Friday *********[facility] please order compression hose for patient 20-3mmHg********* Home Health Nurse may visit PRN to address patient s wound care needs. FACE TO FACE ENCOUNTER: MEDICARE and MEDICAID PATIENTS: I certify that this patient is under my care and that I had a face-to-face encounter that meets the physician face-to-face encounter requirements with this patient on this date. The encounter with the patient was in whole or in part for the following MEDICAL CONDITION: (primary reason for [place]) MEDICAL NECESSITY: I certify, that based on my findings, NURSING services are a medically necessary home health service. HOME BOUND STATUS: I certify that my clinical findings support that this patient is homebound (i.e., Due to [name], [name]. ([id number]) illness or injury, pt requires aid of supportive devices such as  crutches, cane, wheelchairs, walkers, the use of special transportation or the assistance of another person to leave their place of residence. There is a normal inability to leave the home and doing so requires considerable and taxing effort. Other absences are for medical reasons / religious services and are infrequent or of short duration when for other reasons). If current dressing causes regression in wound condition, may D/C ordered dressing product/s and apply Normal Saline Moist Dressing daily until next [place] / Other MD appointment. [name] of regression in wound condition at [phone]. Please direct any NON-WOUND related issues/requests for orders to patient's Primary Care Physician Wound #5 Left,Distal,Lateral Lower Leg: [place] [facility] to change wraps Monday and Friday *********[facility] please order compression hose for patient 20-30mmHg********* Home Health Nurse may visit PRN to address patient s wound care needs. FACE TO FACE ENCOUNTER: MEDICARE and MEDICAID PATIENTS: I certify that this patient is under my care and that I had a face-to-face encounter that meets the physician face-to-face encounter requirements with this patient on this date. The encounter with the patient was in whole or in part for the following MEDICAL CONDITION: (primary reason for [place]) MEDICAL NECESSITY: I certify, that based on my findings, NURSING services are a medically necessary home health service. HOME BOUND STATUS: I certify that my clinical findings support that this patient is homebound (i.e., Due to illness or injury, pt requires aid of supportive devices such as crutches, cane, wheelchairs, walkers, the use of special transportation or the assistance of another person to leave their place of residence. There is a normal inability to leave the home and doing so requires considerable and taxing effort. Other absences are for  medical reasons / religious services and are infrequent or of short duration when for other reasons). If current dressing causes regression in wound condition, may D/C ordered dressing product/s and apply Normal Saline Moist Dressing daily until next [place] / Other MD appointment. [place] of regression in wound condition at [phone]. Please direct any NON-WOUND related issues/requests for orders to patient's Primary Care Physician Wound #7 Left,Medial Lower Leg: [place] [facility]its - [facility] to change wraps Monday and Friday ********[facility] please order compression hose for patient 20-37mmHg********* Home Health Nurse may visit  PRN to address patient s wound care needs. FACE TO FACE ENCOUNTER: MEDICARE and MEDICAID PATIENTS: I certify that this patient is under my care and that I had a face-to-face encounter that meets the physician face-to-face encounter requirements with this patient on this date. The encounter with the patient was in whole or in part for the following MEDICAL CONDITION: (primary reason for [place]) MEDICAL NECESSITY: I certify, that based on my findings, NURSING services are a medically necessary home health service. HOME BOUND STATUS: I certify that my clinical findings support that this patient is homebound (i.e., Due to illness or injury, pt requires aid of supportive devices such as crutches, cane, wheelchairs, walkers, the use of special transportation or the assistance of another person to leave their place of residence. There is a normal inability to leave the home and doing so requires considerable and taxing effort. Other absences are for medical reasons / religious services and are infrequent or of short duration when for other reasons). If current dressing causes regression in wound condition, may D/C ordered dressing product/s and apply Normal Saline Moist Dressing daily until next [place] /  Other MD appointment. [place] of regression in wound condition at [phone]. Please direct any NON-WOUND related issues/requests for orders to patient's Primary Care Physician Services and Therapies ordered were: Arterial Studies- Bilateral - Dr. Tyrell Antonio office, Ankle Brachial Index (ABI) - Dr. Tyrell Antonio office, Toe pressures (TBI) - Dr. Tyrell Antonio office [name], [name] ([id number]) #1 I'm going to continue with Aquacel Ag to all the wounds #23 let her compression. #3 the patient has OrCel was pedis pulse and posterior tibial pulses. Slightly delayed capillary refill time. More proximal pulses seem intact in the femoral. Nevertheless because of the nature of the wounds I am going to go ahead and order formal arterial Dopplers with ABIs and Tbi Electronic Signature(s) Signed: 03/05/2016 1:20:09 PM By: Gretta Cool RN, BSN, Kim RN, BSN Signed: 03/26/2016 7:50:59 AM By: Linton Ham MD Previous Signature: 02/20/2016 5:12:34 PM Version By: Linton Ham MD Entered By: Gretta Cool, RN, BSN, Kim on 03/05/2016 13:20:09 [name], [name] ([id number]) -------------------------------------------------------------------------------- SuperBill Details Patient Name: [name] Date of Service: 02/20/2016 Medical Record Patient Account Number: [id number] Number: Treating RN: Ahmed Prima Aug 20, 1950 (65 y.o. Other Clinician: Date of Birth/Sex: Female) Treating Brieanna Nau Primary Care Physician/Extender: Peri Jefferson, [place] Physician: Weeks in Treatment: 13 Referring Physician: LADA, Irena Diagnosis Coding ICD-10 Codes Code Description G35 Multiple sclerosis E66.01 Morbid (severe) obesity due to excess calories L97.512 Non-pressure chronic ulcer of other part of right foot with fat layer exposed L97.212 Non-pressure chronic ulcer of right calf with fat layer exposed L97.222 Non-pressure chronic ulcer of left calf with fat layer exposed F17.218 Nicotine dependence,  cigarettes, with other nicotine-induced disorders Facility Procedures CPT4 Code: [id number] Description: [id number] - DEB SUBQ TISSUE 20 SQ CM/< ICD-10 Description Diagnosis L97.222 Non-pressure chronic ulcer of left calf with fat l L97.212 Non-pressure chronic ulcer of right calf with fat Modifier: ayer exposed layer exposed Quantity: 1 Physician Procedures CPT4 Code: [id number] Description: 11042 - WC PHYS SUBQ TISS 20 SQ CM ICD-10 Description Diagnosis L97.222 Non-pressure chronic ulcer of left calf with fat l L97.212 Non-pressure chronic ulcer of right calf with fat Modifier: ayer exposed layer exposed Quantity: 1 Electronic Signature(s) Signed: 02/20/2016 5:12:34 PM By: Linton Ham MD Entered By: Linton Ham on 02/20/2016 15:26:44

## 2016-02-23 ENCOUNTER — Ambulatory Visit: Payer: Medicare Other

## 2016-02-23 DIAGNOSIS — R339 Retention of urine, unspecified: Secondary | ICD-10-CM | POA: Diagnosis not present

## 2016-02-23 DIAGNOSIS — I70234 Atherosclerosis of native arteries of right leg with ulceration of heel and midfoot: Secondary | ICD-10-CM | POA: Diagnosis not present

## 2016-02-23 DIAGNOSIS — L98499 Non-pressure chronic ulcer of skin of other sites with unspecified severity: Secondary | ICD-10-CM

## 2016-02-23 DIAGNOSIS — I70249 Atherosclerosis of native arteries of left leg with ulceration of unspecified site: Secondary | ICD-10-CM | POA: Diagnosis not present

## 2016-02-23 DIAGNOSIS — I10 Essential (primary) hypertension: Secondary | ICD-10-CM | POA: Diagnosis not present

## 2016-02-23 DIAGNOSIS — J449 Chronic obstructive pulmonary disease, unspecified: Secondary | ICD-10-CM | POA: Diagnosis not present

## 2016-02-23 DIAGNOSIS — G35 Multiple sclerosis: Secondary | ICD-10-CM | POA: Diagnosis not present

## 2016-02-23 DIAGNOSIS — I70232 Atherosclerosis of native arteries of right leg with ulceration of calf: Secondary | ICD-10-CM | POA: Diagnosis not present

## 2016-02-23 DIAGNOSIS — L97929 Non-pressure chronic ulcer of unspecified part of left lower leg with unspecified severity: Secondary | ICD-10-CM | POA: Diagnosis not present

## 2016-02-23 DIAGNOSIS — L97219 Non-pressure chronic ulcer of right calf with unspecified severity: Secondary | ICD-10-CM | POA: Diagnosis not present

## 2016-02-23 DIAGNOSIS — Z792 Long term (current) use of antibiotics: Secondary | ICD-10-CM | POA: Diagnosis not present

## 2016-02-23 DIAGNOSIS — L97919 Non-pressure chronic ulcer of unspecified part of right lower leg with unspecified severity: Secondary | ICD-10-CM

## 2016-02-23 DIAGNOSIS — L03116 Cellulitis of left lower limb: Secondary | ICD-10-CM | POA: Diagnosis not present

## 2016-02-23 DIAGNOSIS — L97419 Non-pressure chronic ulcer of right heel and midfoot with unspecified severity: Secondary | ICD-10-CM | POA: Diagnosis not present

## 2016-02-26 ENCOUNTER — Other Ambulatory Visit: Payer: Self-pay | Admitting: Family Medicine

## 2016-02-26 ENCOUNTER — Ambulatory Visit: Payer: Medicare Other | Admitting: Cardiovascular Disease

## 2016-02-26 DIAGNOSIS — L03116 Cellulitis of left lower limb: Secondary | ICD-10-CM | POA: Diagnosis not present

## 2016-02-26 DIAGNOSIS — I10 Essential (primary) hypertension: Secondary | ICD-10-CM | POA: Diagnosis not present

## 2016-02-26 DIAGNOSIS — R339 Retention of urine, unspecified: Secondary | ICD-10-CM | POA: Diagnosis not present

## 2016-02-26 DIAGNOSIS — L97419 Non-pressure chronic ulcer of right heel and midfoot with unspecified severity: Secondary | ICD-10-CM | POA: Diagnosis not present

## 2016-02-26 DIAGNOSIS — G35 Multiple sclerosis: Secondary | ICD-10-CM | POA: Diagnosis not present

## 2016-02-26 DIAGNOSIS — J449 Chronic obstructive pulmonary disease, unspecified: Secondary | ICD-10-CM | POA: Diagnosis not present

## 2016-02-26 DIAGNOSIS — L97219 Non-pressure chronic ulcer of right calf with unspecified severity: Secondary | ICD-10-CM | POA: Diagnosis not present

## 2016-02-26 DIAGNOSIS — I70232 Atherosclerosis of native arteries of right leg with ulceration of calf: Secondary | ICD-10-CM | POA: Diagnosis not present

## 2016-02-26 DIAGNOSIS — I70249 Atherosclerosis of native arteries of left leg with ulceration of unspecified site: Secondary | ICD-10-CM | POA: Diagnosis not present

## 2016-02-26 DIAGNOSIS — Z792 Long term (current) use of antibiotics: Secondary | ICD-10-CM | POA: Diagnosis not present

## 2016-02-26 DIAGNOSIS — I70234 Atherosclerosis of native arteries of right leg with ulceration of heel and midfoot: Secondary | ICD-10-CM | POA: Diagnosis not present

## 2016-02-26 DIAGNOSIS — L97929 Non-pressure chronic ulcer of unspecified part of left lower leg with unspecified severity: Secondary | ICD-10-CM | POA: Diagnosis not present

## 2016-02-27 ENCOUNTER — Telehealth: Payer: Self-pay

## 2016-02-27 DIAGNOSIS — I10 Essential (primary) hypertension: Secondary | ICD-10-CM | POA: Diagnosis not present

## 2016-02-27 DIAGNOSIS — L97419 Non-pressure chronic ulcer of right heel and midfoot with unspecified severity: Secondary | ICD-10-CM | POA: Diagnosis not present

## 2016-02-27 DIAGNOSIS — L97929 Non-pressure chronic ulcer of unspecified part of left lower leg with unspecified severity: Secondary | ICD-10-CM | POA: Diagnosis not present

## 2016-02-27 DIAGNOSIS — J449 Chronic obstructive pulmonary disease, unspecified: Secondary | ICD-10-CM | POA: Diagnosis not present

## 2016-02-27 DIAGNOSIS — I70232 Atherosclerosis of native arteries of right leg with ulceration of calf: Secondary | ICD-10-CM | POA: Diagnosis not present

## 2016-02-27 DIAGNOSIS — I70249 Atherosclerosis of native arteries of left leg with ulceration of unspecified site: Secondary | ICD-10-CM | POA: Diagnosis not present

## 2016-02-27 DIAGNOSIS — Z792 Long term (current) use of antibiotics: Secondary | ICD-10-CM | POA: Diagnosis not present

## 2016-02-27 DIAGNOSIS — I70234 Atherosclerosis of native arteries of right leg with ulceration of heel and midfoot: Secondary | ICD-10-CM | POA: Diagnosis not present

## 2016-02-27 DIAGNOSIS — L03116 Cellulitis of left lower limb: Secondary | ICD-10-CM | POA: Diagnosis not present

## 2016-02-27 DIAGNOSIS — L97219 Non-pressure chronic ulcer of right calf with unspecified severity: Secondary | ICD-10-CM | POA: Diagnosis not present

## 2016-02-27 DIAGNOSIS — G35 Multiple sclerosis: Secondary | ICD-10-CM | POA: Diagnosis not present

## 2016-02-27 DIAGNOSIS — R339 Retention of urine, unspecified: Secondary | ICD-10-CM | POA: Diagnosis not present

## 2016-02-27 NOTE — Telephone Encounter (Signed)
Please urge her to call the doctor who is prescribing her benzo (the clonazepam) That drug can cause somnolence, falls, and likely needs to be reduced Thank you

## 2016-02-27 NOTE — Telephone Encounter (Signed)
Mard[facility] Celeste from [facility] called stating the pt  fell twice today. She stated that pt was heavy medicated due to her anti- depressant medications. She fell getting gout the truck and going out the house. Pt refused to seek medical attention; she is ok just some bumps and bruises.

## 2016-02-27 NOTE — Telephone Encounter (Signed)
Patrice from [place] [facility] notified.

## 2016-02-28 ENCOUNTER — Emergency Department: Payer: Medicare Other

## 2016-02-28 ENCOUNTER — Other Ambulatory Visit: Payer: Self-pay

## 2016-02-28 ENCOUNTER — Emergency Department
Admission: EM | Admit: 2016-02-28 | Discharge: 2016-02-28 | Disposition: A | Discharge status: Home / Self Care | Payer: Medicare Other | Attending: Emergency Medicine | Admitting: Emergency Medicine

## 2016-02-28 ENCOUNTER — Ambulatory Visit: Payer: Medicare Other | Admitting: Internal Medicine

## 2016-02-28 DIAGNOSIS — Z79899 Other long term (current) drug therapy: Secondary | ICD-10-CM | POA: Insufficient documentation

## 2016-02-28 DIAGNOSIS — R600 Localized edema: Secondary | ICD-10-CM | POA: Diagnosis not present

## 2016-02-28 DIAGNOSIS — W19XXXA Unspecified fall, initial encounter: Secondary | ICD-10-CM | POA: Diagnosis not present

## 2016-02-28 DIAGNOSIS — W010XXA Fall on same level from slipping, tripping and stumbling without subsequent striking against object, initial encounter: Secondary | ICD-10-CM | POA: Insufficient documentation

## 2016-02-28 DIAGNOSIS — M25562 Pain in left knee: Secondary | ICD-10-CM | POA: Insufficient documentation

## 2016-02-28 DIAGNOSIS — R6889 Other general symptoms and signs: Secondary | ICD-10-CM | POA: Diagnosis not present

## 2016-02-28 DIAGNOSIS — M79671 Pain in right foot: Secondary | ICD-10-CM | POA: Diagnosis not present

## 2016-02-28 DIAGNOSIS — I129 Hypertensive chronic kidney disease with stage 1 through stage 4 chronic kidney disease, or unspecified chronic kidney disease: Secondary | ICD-10-CM | POA: Insufficient documentation

## 2016-02-28 DIAGNOSIS — J449 Chronic obstructive pulmonary disease, unspecified: Secondary | ICD-10-CM | POA: Insufficient documentation

## 2016-02-28 DIAGNOSIS — Y939 Activity, unspecified: Secondary | ICD-10-CM | POA: Diagnosis not present

## 2016-02-28 DIAGNOSIS — M7989 Other specified soft tissue disorders: Secondary | ICD-10-CM | POA: Diagnosis not present

## 2016-02-28 DIAGNOSIS — Z87891 Personal history of nicotine dependence: Secondary | ICD-10-CM | POA: Diagnosis not present

## 2016-02-28 DIAGNOSIS — Y92009 Unspecified place in unspecified non-institutional (private) residence as the place of occurrence of the external cause: Secondary | ICD-10-CM | POA: Insufficient documentation

## 2016-02-28 DIAGNOSIS — N183 Chronic kidney disease, stage 3 (moderate): Secondary | ICD-10-CM | POA: Insufficient documentation

## 2016-02-28 DIAGNOSIS — Y999 Unspecified external cause status: Secondary | ICD-10-CM | POA: Diagnosis not present

## 2016-02-28 LAB — CBC
HCT: 39.9 % (ref 35.0–47.0)
Hemoglobin: 13.4 g/dL (ref 12.0–16.0)
MCH: 28.9 pg (ref 26.0–34.0)
MCHC: 33.6 g/dL (ref 32.0–36.0)
MCV: 86 fL (ref 80.0–100.0)
Platelets: 231 10*3/uL (ref 150–440)
RBC: 4.64 MIL/uL (ref 3.80–5.20)
RDW: 14.4 % (ref 11.5–14.5)
WBC: 7.6 10*3/uL (ref 3.6–11.0)

## 2016-02-28 LAB — URINALYSIS COMPLETE WITH MICROSCOPIC (ARMC ONLY)
Bilirubin Urine: NEGATIVE
Glucose, UA: NEGATIVE mg/dL
Hgb urine dipstick: NEGATIVE
Ketones, ur: NEGATIVE mg/dL
Nitrite: POSITIVE — AB
Protein, ur: NEGATIVE mg/dL
Specific Gravity, Urine: 1.011 (ref 1.005–1.030)
pH: 8 (ref 5.0–8.0)

## 2016-02-28 LAB — BASIC METABOLIC PANEL
Anion gap: 9 (ref 5–15)
BUN: 22 mg/dL — ABNORMAL HIGH (ref 6–20)
CO2: 23 mmol/L (ref 22–32)
Calcium: 9.4 mg/dL (ref 8.9–10.3)
Chloride: 107 mmol/L (ref 101–111)
Creatinine, Ser: 1.54 mg/dL — ABNORMAL HIGH (ref 0.44–1.00)
GFR calc Af Amer: 40 mL/min — ABNORMAL LOW (ref >60)
GFR calc non Af Amer: 34 mL/min — ABNORMAL LOW (ref >60)
Glucose, Bld: 106 mg/dL — ABNORMAL HIGH (ref 65–99)
Potassium: 3.6 mmol/L (ref 3.5–5.1)
Sodium: 139 mmol/L (ref 135–145)

## 2016-02-28 LAB — GLUCOSE, CAPILLARY: Glucose-Capillary: 115 mg/dL — ABNORMAL HIGH (ref 65–99)

## 2016-02-28 MED ORDER — TRAMADOL HCL 50 MG PO TABS: 50.0000 mg | ORAL_TABLET | Freq: Four times a day (QID) | ORAL | 0 refills | Status: DC | PRN

## 2016-02-28 MED ORDER — CEPHALEXIN 500 MG PO CAPS: 500.0000 mg | ORAL_CAPSULE | Freq: Two times a day (BID) | ORAL | Status: DC

## 2016-02-28 MED ORDER — HYDROMORPHONE HCL 2 MG PO TABS
2.0000 mg | ORAL_TABLET | Freq: Once | ORAL | Status: AC
  Administered 2016-02-28: 2 mg via ORAL
  Filled 2016-02-28: qty 1

## 2016-02-28 MED ORDER — CEPHALEXIN 500 MG PO CAPS: 500.0000 mg | ORAL_CAPSULE | Freq: Three times a day (TID) | ORAL | 0 refills | Status: AC

## 2016-02-28 NOTE — ED Triage Notes (Signed)
Per EMS, pt has had an increase in falls over the past couple months. Pt fell twice today when trying to go to the wound clinic for bilateral leg wounds. Pt complains of left leg pain and swelling. Pt states hx of MS and reports increased weakness for the past 2 weeks. Pt has chronic foley for the past 4 years. VS per EMS: 118/63, 94 HR and 95% on RA.

## 2016-02-28 NOTE — ED Notes (Signed)

## 2016-02-28 NOTE — ED Provider Notes (Signed)
Time Seen: Approximately1923  I have reviewed the triage notes  Chief Complaint: Weakness and Fall   History of Present Illness: [name] is a 65 y.o. female who has a history of multiple sclerosis. She also has a history of peripheral vascular disease and is currently followed in the wound clinic for some skin ulcerations, etc. She states she's had chronic difficulty with her left lower extremity secondary to her multiple sclerosis and has had difficulty walking without tripping at home. Her husband states that her walker is old and she likely needs a new one. She has trouble with Monistat 7 is had repetitive trips and falls at home. The patient denies any significant head trauma and is no history of altered mental status. Patient Mr. [name] clinic appointment today and points to a small area of redness on the anterior surface of her right foot that apparently is been concerning her. States most of her pain from her fall seems to be located in her left knee and she has been able to bear weight after her falls.  Past Medical History:  Diagnosis Date  . Anxiety   . COPD (chronic obstructive pulmonary disease) ([place])   . Depression   . Hypertension   . Kyphoscoliosis and scoliosis 11/26/2011  . Morbid obesity ([place]) 01/05/2016  . Multiple sclerosis ([place])   . Neuromuscular disorder ([place])   . Peripheral vascular disease of lower extremity with ulceration ([place]) 11/08/2015  . Skin ulcer ([place]) 11/08/2015    Patient Active Problem List   Diagnosis Date Noted  . Swelling of nose 01/07/2016  . Foreign body sensation in left ear canal 01/07/2016  . Breast cancer screening 01/05/2016  . Morbid obesity ([place]) 01/05/2016  . Pressure ulcer 12/19/2015  . Low HDL (under 40) 12/19/2015  . Cellulitis 12/18/2015  . Skin ulcer ([place]) 11/08/2015  . Peripheral vascular disease of lower extremity with ulceration ([place]) 11/08/2015  . CKD (chronic kidney disease) stage 3, GFR 30-59 ml/min 11/08/2015  .  Decubitus ulcer of left thigh, stage 2 04/18/2015  . Constipation due to pain medication 01/31/2015  . Obstructive sleep apnea of adult 01/13/2015  . Pelvic muscle wasting 01/13/2015  . Incomplete bladder emptying 01/13/2015  . Headache, migraine 10/24/2014  . 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 Current tobacco use 10/24/2014  . Lumbar radiculopathy, chronic 10/02/2013  . COPD with bronchial hyperresponsiveness ([place]) 10/02/2013  . Major depressive disorder, recurrent, in partial remission ([place]) 10/02/2013  . Hypertension goal BP (blood pressure) < 140/90 10/02/2013  . Multiple sclerosis ([place]) 10/02/2013  . Absence of bladder continence 09/25/2012  . Acontractile bladder 02/12/2012  . Bladder retention 02/12/2012  . Narrowing of intervertebral disc space 11/26/2011  . Kyphoscoliosis and scoliosis 11/26/2011    Past Surgical History:  Procedure Laterality Date  . BACK SURGERY N/A 2002  . TONSILLECTOMY AND ADENOIDECTOMY    . TUBAL LIGATION      Past Surgical History:  Procedure Laterality Date  . BACK SURGERY N/A 2002  . TONSILLECTOMY AND ADENOIDECTOMY    . TUBAL LIGATION      Current Outpatient Rx  . Order #: [id number] Class: Historical Med  . [START ON 02/29/2016] Order #: [id number] Class: Print  . Order #: [id number] Class: Historical Med  . Order #: [id number] Class: Print  . Order #: [id number] Class: Normal  . Order #: [id number] Class: Historical Med  . Order #: [id number] Class: Historical Med  . Order #: [id number] Class: Normal  . Order #: [id number] Class: Normal  . Order #: [id number] Class: Normal  .  Order #: [id number] Class: Historical Med  . Order #: [id number] Class: Historical Med  . Order #: [id number] Class: Historical Med  . Order #: [id number] Class: Print  . Order #: [id number] Class: Normal  . Order #: [id number] Class: Historical Med  . Order #: [id number] Class: Historical Med  . Order #: [id number] Class: Historical Med  . Order #: [id number] Class: Normal  . Order #:  [id number] Class: Print  . Order #: [id number] Class: Historical Med    Allergies:  Review of patient's allergies indicates no known allergies.  Family History: Family History  Problem Relation Age of Onset  . COPD Mother   . Diabetes Mother   . Alcohol abuse Father   . Kidney disease Father     Social History: Social History  Substance Use Topics  . Smoking status: Former Smoker    Types: Cigarettes  . Smokeless tobacco: Never Used  . Alcohol use 0.0 oz/week     Comment: Pt states occasionally (liquor)     Review of Systems:   10 point review of systems was performed and was otherwise negative:  Constitutional: No fever Eyes: No visual disturbances ENT: No sore throat, ear pain Cardiac: No chest pain Respiratory: No shortness of breath, wheezing, or stridor Abdomen: No abdominal pain, no vomiting, No diarrhea Endocrine: No weight loss, No night sweats Extremities: Chronic bilateral peripheral edema left-sided worse than the right. Skin: Some diffuse erythema across his skin and left anterior surface of the foot on both sides which seems to be chronic in nature. Neurologic: No focal weakness, trouble with speech or swollowing Urologic: No dysuria, Hematuria, or urinary frequency   Physical Exam:  ED Triage Vitals  Enc Vitals Group     BP 02/28/16 1626 129/85     Pulse Rate 02/28/16 1626 87     Resp 02/28/16 1626 16     Temp 02/28/16 1626 98.2 F (36.8 C)     Temp Source 02/28/16 1626 Oral     SpO2 02/28/16 1626 96 %     Weight 02/28/16 1627 287 lb 5 oz (130.3 kg)     Height 02/28/16 1627 5\' 3"  (1.6 m)     Head Circumference --      Peak Flow --      Pain Score 02/28/16 1627 8     Pain Loc --      Pain Edu? --      Excl. in [place]? --     General: Awake , Alert , and Oriented times 3; GCS 15 Head: Normal cephalic , atraumatic Eyes: Pupils equal , round, reactive to light Nose/Throat: No nasal drainage, patent upper airway without erythema or exudate.  Neck:  Supple, Full range of motion, No anterior adenopathy or palpable thyroid masses Lungs: Clear to ascultation without wheezes , rhonchi, or rales Heart: Regular rate, regular rhythm without murmurs , gallops , or rubs Abdomen: Morbidly obese Soft, non tender without rebound, guarding , or rigidity; bowel sounds positive and symmetric in all 4 quadrants. No organomegaly .        Extremities: Patient has wraps on both lower extremities which when removed shows some diffuse erythema with no obvious cellulitic type pattern. The small punctate scabbed wound on the right anterior surface of the foot does not show any fluctuance. The left knee is tender primarily over the patella without crepitus or step-off noted. Stable to anterior and posterior drawer sign along with varus and valgus movement. Neurologic:, Motor symmetric without deficits, sensory intact Skin: warm, dry, no rashes   Labs:  All laboratory work was reviewed including any pertinent negatives or positives listed below:  Labs Reviewed  BASIC METABOLIC PANEL - Abnormal; Notable for the following:       Result Value   Glucose, Bld 106 (*)    BUN 22 (*)    Creatinine, Ser 1.54 (*)    GFR calc non Af Amer 34 (*)    GFR calc Af Amer 40 (*)    All other components within normal limits  URINALYSIS COMPLETEWITH MICROSCOPIC ([facility] ONLY) - Abnormal; Notable for the following:    Color, Urine YELLOW (*)    APPearance CLOUDY (*)    Nitrite POSITIVE (*)    Leukocytes, UA 2+ (*)    Bacteria, UA RARE (*)    Squamous Epithelial / LPF 0-5 (*)    All other components within normal limits  GLUCOSE, CAPILLARY - Abnormal; Notable for the following:    Glucose-Capillary 115 (*)    All other components within normal limits  URINE CULTURE  CBC  CBG MONITORING, ED  Urine culture is pending  Radiology:  "US Venous Img Lower Unilateral Left  Result Date: 02/28/2016 CLINICAL DATA:  Left lower extremity pain and edema. History of fall. EXAM: Left  LOWER EXTREMITY VENOUS DOPPLER ULTRASOUND TECHNIQUE: Gray-scale sonography with graded compression, as well as color Doppler and duplex ultrasound were performed to evaluate the lower extremity deep venous systems from the level of the common femoral vein and including the common femoral, femoral, profunda femoral, popliteal and calf veins including the posterior tibial, peroneal and gastrocnemius veins when visible. The superficial great saphenous vein was also interrogated. Spectral Doppler was utilized to evaluate flow at rest and with distal augmentation maneuvers in the common femoral, femoral and popliteal veins. COMPARISON:  07/11/2004 FINDINGS: Contralateral Common Femoral Vein: Respiratory phasicity is normal and symmetric with the symptomatic side. No evidence of thrombus. Normal compressibility. Common Femoral Vein: No evidence of thrombus. Normal compressibility, respiratory phasicity and response to augmentation. Saphenofemoral Junction: No evidence of thrombus. Normal compressibility and flow on color Doppler imaging. Profunda Femoral Vein: No evidence of thrombus. Normal compressibility and flow on color Doppler imaging. Femoral Vein: No evidence of thrombus. Normal compressibility, respiratory phasicity and response to augmentation. Popliteal Vein: No evidence of thrombus. Normal compressibility, respiratory phasicity and response to augmentation. Calf Veins: Limited evaluation due to edema. Superficial Great Saphenous Vein: No evidence of thrombus. Normal compressibility and flow on color Doppler imaging. Venous Reflux:  None. Other Findings: Enlarged left groin lymph nodes measuring up to 6.2 cm. Mild to moderate edema within the left calf. IMPRESSION: 1. Mild to moderate edema within the left calf. No evidence for acute DVT from left groin to popliteal fossa. Slightly limited evaluation of calf veins due to edema. 2. Left inguinal enlarged lymph nodes up to 6.2 cm, findings could be secondary to  reactive node, infection, inflammation, or lymphoproliferative disease. Electronically Signed   By: Donavan Foil M.D.   On: 02/28/2016 18:29   Dg Knee Complete 4 Views Left  Result Date: 02/28/2016 CLINICAL DATA:  Fall today. Left knee pain and swelling. Initial encounter. EXAM: LEFT KNEE - COMPLETE 4+ VIEW COMPARISON:  None. FINDINGS: No evidence of fracture, dislocation, or joint effusion. Mild medial compartment osteoarthritis noted. No other osseous abnormality identified. IMPRESSION: No acute findings.  Mild medial compartment osteoarthritis. Electronically Signed   By: Earle Gell M.D.   On: 02/28/2016 17:54   Dg Foot 2 Views Right  Result Date: 02/28/2016 CLINICAL DATA:  Fall  today. Right foot injury and pain. Ulcer along dorsal aspect of right foot. Initial encounter. EXAM: RIGHT FOOT - 2 VIEW COMPARISON:  None. FINDINGS: Soft tissue swelling is seen along the dorsal aspect of the metatarsals. No evidence of osteolysis or periostitis. No evidence of acute fracture or dislocation. Old fracture deformity of distal fifth metatarsal is noted. Mild to moderate hallux valgus with bony bunion. Small plantar and dorsal calcaneal bone spurs. IMPRESSION: Soft tissue swelling along dorsal aspect of metatarsals. No radiographic evidence of osteomyelitis or other acute osseous abnormality. Mild to moderate hallux valgus with bony bunion. Electronically Signed   By: Earle Gell M.D.   On: 02/28/2016 17:52  "  I personally reviewed the radiologic studies    ED Course:  Patient's stay here was uneventful and she will be started on outpatient basis on Keflex which I felt would give her reasonable skin coverage along with urine coverage. Urine culture is pending at this time. A left knee immobilizer was applied to the knee which does not show any findings of an acute unstable injury. Option for pain control and advised to follow up with her primary physician if they need more supplies at home including a new  walker. Clinical Course     Assessment:  Status post fall with left knee sprain Chronic peripheral edema with possible cellulitic pattern   Final Clinical Impression: *  Final diagnoses:  Fall, initial encounter     Plan:  Outpatient " Discharge Medication List as of 02/28/2016  8:39 PM    START taking these medications   Details  cephALEXin (KEFLEX) 500 MG capsule Take 1 capsule (500 mg total) by mouth 3 (three) times daily., Starting Thu 02/29/2016, Until Sun 03/10/2016, Print    traMADol (ULTRAM) 50 MG tablet Take 1 tablet (50 mg total) by mouth every 6 (six) hours as needed., Starting Wed 02/28/2016, Print      " Patient was advised to return immediately if condition worsens. Patient was advised to follow up with their primary care physician or other specialized physicians involved in their outpatient care. The patient and/or family member/power of attorney had laboratory results reviewed at the bedside. All questions and concerns were addressed and appropriate discharge instructions were distributed by the nursing staff.             Daymon Larsen, MD 02/28/16 [phone]

## 2016-02-28 NOTE — Discharge Instructions (Signed)
Please contact your primary physician for further outpatient follow-up along with new equipment, etc. that may be needed at home. Please drink plenty of fluids. Continue all of your current medications.  Please return immediately if condition worsens. Please contact her primary physician or the physician you were given for referral. If you have any specialist physicians involved in her treatment and plan please also contact them. Thank you for using [place] [facility].

## 2016-03-01 DIAGNOSIS — L97419 Non-pressure chronic ulcer of right heel and midfoot with unspecified severity: Secondary | ICD-10-CM | POA: Diagnosis not present

## 2016-03-01 DIAGNOSIS — L03116 Cellulitis of left lower limb: Secondary | ICD-10-CM | POA: Diagnosis not present

## 2016-03-01 DIAGNOSIS — L97219 Non-pressure chronic ulcer of right calf with unspecified severity: Secondary | ICD-10-CM | POA: Diagnosis not present

## 2016-03-01 DIAGNOSIS — R339 Retention of urine, unspecified: Secondary | ICD-10-CM | POA: Diagnosis not present

## 2016-03-01 DIAGNOSIS — I70232 Atherosclerosis of native arteries of right leg with ulceration of calf: Secondary | ICD-10-CM | POA: Diagnosis not present

## 2016-03-01 DIAGNOSIS — I70249 Atherosclerosis of native arteries of left leg with ulceration of unspecified site: Secondary | ICD-10-CM | POA: Diagnosis not present

## 2016-03-01 DIAGNOSIS — J449 Chronic obstructive pulmonary disease, unspecified: Secondary | ICD-10-CM | POA: Diagnosis not present

## 2016-03-01 DIAGNOSIS — L97929 Non-pressure chronic ulcer of unspecified part of left lower leg with unspecified severity: Secondary | ICD-10-CM | POA: Diagnosis not present

## 2016-03-01 DIAGNOSIS — I70234 Atherosclerosis of native arteries of right leg with ulceration of heel and midfoot: Secondary | ICD-10-CM | POA: Diagnosis not present

## 2016-03-01 DIAGNOSIS — I10 Essential (primary) hypertension: Secondary | ICD-10-CM | POA: Diagnosis not present

## 2016-03-01 DIAGNOSIS — G35 Multiple sclerosis: Secondary | ICD-10-CM | POA: Diagnosis not present

## 2016-03-01 DIAGNOSIS — Z792 Long term (current) use of antibiotics: Secondary | ICD-10-CM | POA: Diagnosis not present

## 2016-03-04 DIAGNOSIS — I10 Essential (primary) hypertension: Secondary | ICD-10-CM | POA: Diagnosis not present

## 2016-03-04 DIAGNOSIS — L97419 Non-pressure chronic ulcer of right heel and midfoot with unspecified severity: Secondary | ICD-10-CM | POA: Diagnosis not present

## 2016-03-04 DIAGNOSIS — L03116 Cellulitis of left lower limb: Secondary | ICD-10-CM | POA: Diagnosis not present

## 2016-03-04 DIAGNOSIS — L97929 Non-pressure chronic ulcer of unspecified part of left lower leg with unspecified severity: Secondary | ICD-10-CM | POA: Diagnosis not present

## 2016-03-04 DIAGNOSIS — G35 Multiple sclerosis: Secondary | ICD-10-CM | POA: Diagnosis not present

## 2016-03-04 DIAGNOSIS — I70234 Atherosclerosis of native arteries of right leg with ulceration of heel and midfoot: Secondary | ICD-10-CM | POA: Diagnosis not present

## 2016-03-04 DIAGNOSIS — L97219 Non-pressure chronic ulcer of right calf with unspecified severity: Secondary | ICD-10-CM | POA: Diagnosis not present

## 2016-03-04 DIAGNOSIS — J449 Chronic obstructive pulmonary disease, unspecified: Secondary | ICD-10-CM | POA: Diagnosis not present

## 2016-03-04 DIAGNOSIS — R339 Retention of urine, unspecified: Secondary | ICD-10-CM | POA: Diagnosis not present

## 2016-03-04 DIAGNOSIS — I70232 Atherosclerosis of native arteries of right leg with ulceration of calf: Secondary | ICD-10-CM | POA: Diagnosis not present

## 2016-03-04 DIAGNOSIS — Z792 Long term (current) use of antibiotics: Secondary | ICD-10-CM | POA: Diagnosis not present

## 2016-03-04 DIAGNOSIS — I70249 Atherosclerosis of native arteries of left leg with ulceration of unspecified site: Secondary | ICD-10-CM | POA: Diagnosis not present

## 2016-03-05 DIAGNOSIS — L97219 Non-pressure chronic ulcer of right calf with unspecified severity: Secondary | ICD-10-CM | POA: Diagnosis not present

## 2016-03-05 DIAGNOSIS — I10 Essential (primary) hypertension: Secondary | ICD-10-CM | POA: Diagnosis not present

## 2016-03-05 DIAGNOSIS — L97419 Non-pressure chronic ulcer of right heel and midfoot with unspecified severity: Secondary | ICD-10-CM | POA: Diagnosis not present

## 2016-03-05 DIAGNOSIS — I70232 Atherosclerosis of native arteries of right leg with ulceration of calf: Secondary | ICD-10-CM | POA: Diagnosis not present

## 2016-03-05 DIAGNOSIS — Z792 Long term (current) use of antibiotics: Secondary | ICD-10-CM | POA: Diagnosis not present

## 2016-03-05 DIAGNOSIS — G35 Multiple sclerosis: Secondary | ICD-10-CM | POA: Diagnosis not present

## 2016-03-05 DIAGNOSIS — L03116 Cellulitis of left lower limb: Secondary | ICD-10-CM | POA: Diagnosis not present

## 2016-03-05 DIAGNOSIS — J449 Chronic obstructive pulmonary disease, unspecified: Secondary | ICD-10-CM | POA: Diagnosis not present

## 2016-03-05 DIAGNOSIS — I70234 Atherosclerosis of native arteries of right leg with ulceration of heel and midfoot: Secondary | ICD-10-CM | POA: Diagnosis not present

## 2016-03-05 DIAGNOSIS — I70249 Atherosclerosis of native arteries of left leg with ulceration of unspecified site: Secondary | ICD-10-CM | POA: Diagnosis not present

## 2016-03-05 DIAGNOSIS — L97929 Non-pressure chronic ulcer of unspecified part of left lower leg with unspecified severity: Secondary | ICD-10-CM | POA: Diagnosis not present

## 2016-03-05 DIAGNOSIS — R339 Retention of urine, unspecified: Secondary | ICD-10-CM | POA: Diagnosis not present

## 2016-03-05 LAB — URINE CULTURE: Culture: >=100000 — AB

## 2016-03-06 DIAGNOSIS — L97219 Non-pressure chronic ulcer of right calf with unspecified severity: Secondary | ICD-10-CM | POA: Diagnosis not present

## 2016-03-06 DIAGNOSIS — G35 Multiple sclerosis: Secondary | ICD-10-CM | POA: Diagnosis not present

## 2016-03-06 DIAGNOSIS — L03116 Cellulitis of left lower limb: Secondary | ICD-10-CM | POA: Diagnosis not present

## 2016-03-06 DIAGNOSIS — R339 Retention of urine, unspecified: Secondary | ICD-10-CM | POA: Diagnosis not present

## 2016-03-06 DIAGNOSIS — L97929 Non-pressure chronic ulcer of unspecified part of left lower leg with unspecified severity: Secondary | ICD-10-CM | POA: Diagnosis not present

## 2016-03-06 DIAGNOSIS — Z792 Long term (current) use of antibiotics: Secondary | ICD-10-CM | POA: Diagnosis not present

## 2016-03-06 DIAGNOSIS — L97419 Non-pressure chronic ulcer of right heel and midfoot with unspecified severity: Secondary | ICD-10-CM | POA: Diagnosis not present

## 2016-03-06 DIAGNOSIS — J449 Chronic obstructive pulmonary disease, unspecified: Secondary | ICD-10-CM | POA: Diagnosis not present

## 2016-03-06 DIAGNOSIS — I70249 Atherosclerosis of native arteries of left leg with ulceration of unspecified site: Secondary | ICD-10-CM | POA: Diagnosis not present

## 2016-03-06 DIAGNOSIS — I70232 Atherosclerosis of native arteries of right leg with ulceration of calf: Secondary | ICD-10-CM | POA: Diagnosis not present

## 2016-03-06 DIAGNOSIS — I10 Essential (primary) hypertension: Secondary | ICD-10-CM | POA: Diagnosis not present

## 2016-03-06 DIAGNOSIS — I70234 Atherosclerosis of native arteries of right leg with ulceration of heel and midfoot: Secondary | ICD-10-CM | POA: Diagnosis not present

## 2016-03-06 NOTE — Progress Notes (Addendum)
ED Antimicrobial Stewardship Positive Culture Follow Up   [name] is an 65 y.o. female who presented to [facility] on 02/28/2016 with a chief complaint of  Chief Complaint  Patient presents with  . Weakness  . Fall    Recent Results (from the past 720 hour(s))  Urine culture     Status: Abnormal   Collection Time: 02/28/16  7:30 PM  Result Value Ref Range Status   Specimen Description URINE, CATHETERIZED  Final   Special Requests Immunocompromised  Final   Culture >=100,000 COLONIES/mL PROVIDENCIA RETTGERI (A)  Final   Report Status 03/05/2016 FINAL  Final   Organism ID, Bacteria PROVIDENCIA RETTGERI (A)  Final      Susceptibility   Providencia rettgeri - MIC*    AMPICILLIN >=32 RESISTANT Resistant     CEFAZOLIN <=4 RESISTANT Resistant     CEFTRIAXONE <=1 SENSITIVE Sensitive     CIPROFLOXACIN <=0.25 SENSITIVE Sensitive     GENTAMICIN <=1 SENSITIVE Sensitive     IMIPENEM 1 SENSITIVE Sensitive     NITROFURANTOIN 64 RESISTANT Resistant     TRIMETH/SULFA <=20 SENSITIVE Sensitive     AMPICILLIN/SULBACTAM <=2 SENSITIVE Sensitive     PIP/TAZO <=4 SENSITIVE Sensitive     * >=100,000 COLONIES/mL PROVIDENCIA RETTGERI    [x]  Treated with cephalexin, organism resistant to prescribed antimicrobial []  Patient discharged originally without antimicrobial agent and treatment is now indicated  New antibiotic prescription: Bactrim DS 1 tablet PO BID x 3 days  ED Provider: Eston [name] C 03/06/2016, 3:28 PM   Called the above prescription into [facility] on 11/8.   [name] 03/06/16 4:01 PM

## 2016-03-08 ENCOUNTER — Other Ambulatory Visit: Payer: Self-pay | Admitting: Family Medicine

## 2016-03-08 DIAGNOSIS — G35 Multiple sclerosis: Secondary | ICD-10-CM | POA: Diagnosis not present

## 2016-03-08 DIAGNOSIS — R339 Retention of urine, unspecified: Secondary | ICD-10-CM | POA: Diagnosis not present

## 2016-03-08 DIAGNOSIS — I70249 Atherosclerosis of native arteries of left leg with ulceration of unspecified site: Secondary | ICD-10-CM | POA: Diagnosis not present

## 2016-03-08 DIAGNOSIS — I10 Essential (primary) hypertension: Secondary | ICD-10-CM | POA: Diagnosis not present

## 2016-03-08 DIAGNOSIS — I70234 Atherosclerosis of native arteries of right leg with ulceration of heel and midfoot: Secondary | ICD-10-CM | POA: Diagnosis not present

## 2016-03-08 DIAGNOSIS — L97929 Non-pressure chronic ulcer of unspecified part of left lower leg with unspecified severity: Secondary | ICD-10-CM | POA: Diagnosis not present

## 2016-03-08 DIAGNOSIS — J449 Chronic obstructive pulmonary disease, unspecified: Secondary | ICD-10-CM | POA: Diagnosis not present

## 2016-03-08 DIAGNOSIS — I70232 Atherosclerosis of native arteries of right leg with ulceration of calf: Secondary | ICD-10-CM | POA: Diagnosis not present

## 2016-03-08 DIAGNOSIS — L97219 Non-pressure chronic ulcer of right calf with unspecified severity: Secondary | ICD-10-CM | POA: Diagnosis not present

## 2016-03-08 DIAGNOSIS — Z792 Long term (current) use of antibiotics: Secondary | ICD-10-CM | POA: Diagnosis not present

## 2016-03-08 DIAGNOSIS — L97419 Non-pressure chronic ulcer of right heel and midfoot with unspecified severity: Secondary | ICD-10-CM | POA: Diagnosis not present

## 2016-03-08 DIAGNOSIS — L03116 Cellulitis of left lower limb: Secondary | ICD-10-CM | POA: Diagnosis not present

## 2016-03-11 ENCOUNTER — Other Ambulatory Visit: Payer: Self-pay

## 2016-03-11 DIAGNOSIS — I70234 Atherosclerosis of native arteries of right leg with ulceration of heel and midfoot: Secondary | ICD-10-CM | POA: Diagnosis not present

## 2016-03-11 DIAGNOSIS — G35 Multiple sclerosis: Secondary | ICD-10-CM | POA: Diagnosis not present

## 2016-03-11 DIAGNOSIS — L97929 Non-pressure chronic ulcer of unspecified part of left lower leg with unspecified severity: Secondary | ICD-10-CM | POA: Diagnosis not present

## 2016-03-11 DIAGNOSIS — L03116 Cellulitis of left lower limb: Secondary | ICD-10-CM | POA: Diagnosis not present

## 2016-03-11 DIAGNOSIS — I70232 Atherosclerosis of native arteries of right leg with ulceration of calf: Secondary | ICD-10-CM | POA: Diagnosis not present

## 2016-03-11 DIAGNOSIS — L97419 Non-pressure chronic ulcer of right heel and midfoot with unspecified severity: Secondary | ICD-10-CM | POA: Diagnosis not present

## 2016-03-11 DIAGNOSIS — R339 Retention of urine, unspecified: Secondary | ICD-10-CM | POA: Diagnosis not present

## 2016-03-11 DIAGNOSIS — I10 Essential (primary) hypertension: Secondary | ICD-10-CM | POA: Diagnosis not present

## 2016-03-11 DIAGNOSIS — L97219 Non-pressure chronic ulcer of right calf with unspecified severity: Secondary | ICD-10-CM | POA: Diagnosis not present

## 2016-03-11 DIAGNOSIS — Z792 Long term (current) use of antibiotics: Secondary | ICD-10-CM | POA: Diagnosis not present

## 2016-03-11 DIAGNOSIS — K5903 Drug induced constipation: Secondary | ICD-10-CM

## 2016-03-11 DIAGNOSIS — I70249 Atherosclerosis of native arteries of left leg with ulceration of unspecified site: Secondary | ICD-10-CM | POA: Diagnosis not present

## 2016-03-11 DIAGNOSIS — J449 Chronic obstructive pulmonary disease, unspecified: Secondary | ICD-10-CM | POA: Diagnosis not present

## 2016-03-11 MED ORDER — GABAPENTIN 600 MG PO TABS: 600.0000 mg | ORAL_TABLET | Freq: Three times a day (TID) | ORAL | 1 refills | Status: DC

## 2016-03-11 MED ORDER — POLYETHYLENE GLYCOL 3350 17 GM/SCOOP PO POWD: 17.0000 g | Freq: Every day | ORAL | 2 refills | Status: DC

## 2016-03-11 NOTE — Telephone Encounter (Signed)
6 month supply of the topiramate was approved in July; she should not run out the 3rd week of January Gabapentin approved

## 2016-03-12 ENCOUNTER — Encounter: Payer: Medicare Other | Attending: Internal Medicine | Admitting: Internal Medicine

## 2016-03-12 DIAGNOSIS — I87313 Chronic venous hypertension (idiopathic) with ulcer of bilateral lower extremity: Secondary | ICD-10-CM | POA: Diagnosis not present

## 2016-03-12 DIAGNOSIS — G35 Multiple sclerosis: Secondary | ICD-10-CM | POA: Insufficient documentation

## 2016-03-12 DIAGNOSIS — I10 Essential (primary) hypertension: Secondary | ICD-10-CM | POA: Diagnosis not present

## 2016-03-12 DIAGNOSIS — J449 Chronic obstructive pulmonary disease, unspecified: Secondary | ICD-10-CM | POA: Diagnosis not present

## 2016-03-12 DIAGNOSIS — Z79899 Other long term (current) drug therapy: Secondary | ICD-10-CM | POA: Diagnosis not present

## 2016-03-12 DIAGNOSIS — Z6841 Body Mass Index (BMI) 40.0 and over, adult: Secondary | ICD-10-CM | POA: Diagnosis not present

## 2016-03-12 DIAGNOSIS — G4733 Obstructive sleep apnea (adult) (pediatric): Secondary | ICD-10-CM | POA: Insufficient documentation

## 2016-03-12 DIAGNOSIS — R32 Unspecified urinary incontinence: Secondary | ICD-10-CM | POA: Diagnosis not present

## 2016-03-12 DIAGNOSIS — F419 Anxiety disorder, unspecified: Secondary | ICD-10-CM | POA: Insufficient documentation

## 2016-03-12 DIAGNOSIS — I739 Peripheral vascular disease, unspecified: Secondary | ICD-10-CM | POA: Diagnosis not present

## 2016-03-12 DIAGNOSIS — F17218 Nicotine dependence, cigarettes, with other nicotine-induced disorders: Secondary | ICD-10-CM | POA: Diagnosis not present

## 2016-03-12 DIAGNOSIS — L97512 Non-pressure chronic ulcer of other part of right foot with fat layer exposed: Secondary | ICD-10-CM | POA: Insufficient documentation

## 2016-03-12 DIAGNOSIS — L97822 Non-pressure chronic ulcer of other part of left lower leg with fat layer exposed: Secondary | ICD-10-CM | POA: Diagnosis not present

## 2016-03-12 DIAGNOSIS — L97222 Non-pressure chronic ulcer of left calf with fat layer exposed: Secondary | ICD-10-CM | POA: Insufficient documentation

## 2016-03-12 DIAGNOSIS — L97212 Non-pressure chronic ulcer of right calf with fat layer exposed: Secondary | ICD-10-CM | POA: Diagnosis not present

## 2016-03-13 DIAGNOSIS — I70232 Atherosclerosis of native arteries of right leg with ulceration of calf: Secondary | ICD-10-CM | POA: Diagnosis not present

## 2016-03-13 DIAGNOSIS — J449 Chronic obstructive pulmonary disease, unspecified: Secondary | ICD-10-CM | POA: Diagnosis not present

## 2016-03-13 DIAGNOSIS — L97929 Non-pressure chronic ulcer of unspecified part of left lower leg with unspecified severity: Secondary | ICD-10-CM | POA: Diagnosis not present

## 2016-03-13 DIAGNOSIS — R339 Retention of urine, unspecified: Secondary | ICD-10-CM | POA: Diagnosis not present

## 2016-03-13 DIAGNOSIS — G35 Multiple sclerosis: Secondary | ICD-10-CM | POA: Diagnosis not present

## 2016-03-13 DIAGNOSIS — I10 Essential (primary) hypertension: Secondary | ICD-10-CM | POA: Diagnosis not present

## 2016-03-13 DIAGNOSIS — I70249 Atherosclerosis of native arteries of left leg with ulceration of unspecified site: Secondary | ICD-10-CM | POA: Diagnosis not present

## 2016-03-13 DIAGNOSIS — Z792 Long term (current) use of antibiotics: Secondary | ICD-10-CM | POA: Diagnosis not present

## 2016-03-13 DIAGNOSIS — I70234 Atherosclerosis of native arteries of right leg with ulceration of heel and midfoot: Secondary | ICD-10-CM | POA: Diagnosis not present

## 2016-03-13 DIAGNOSIS — L97419 Non-pressure chronic ulcer of right heel and midfoot with unspecified severity: Secondary | ICD-10-CM | POA: Diagnosis not present

## 2016-03-13 DIAGNOSIS — L97219 Non-pressure chronic ulcer of right calf with unspecified severity: Secondary | ICD-10-CM | POA: Diagnosis not present

## 2016-03-13 DIAGNOSIS — L03116 Cellulitis of left lower limb: Secondary | ICD-10-CM | POA: Diagnosis not present

## 2016-03-13 NOTE — Progress Notes (Signed)
[name], [name] ([id number]) Visit Report for 03/12/2016 Chief Complaint Document Details Patient Name: AMAIRANI, MICCIO Date of Service: 03/12/2016 1:30 PM Medical Record Patient Account Number: [id number] Number: Treating RN: Montey Hora Oct 19, 1950 (65 y.o. Other Clinician: Date of Birth/Sex: Female) Treating ROBSON, MICHAEL Primary Care Physician/Extender: Peri Jefferson, [place] Physician: Referring Physician: LADA, MELINDA Weeks in Treatment: 16 Information Obtained from: Patient Chief Complaint Patient seen for complaints of Non-Healing Wound to the left and right lower extremity and the right dorsum of the foot Electronic Signature(s) Signed: 03/12/2016 4:42:06 PM By: Linton Ham MD Entered By: Linton Ham on 03/12/2016 14:30:32 [name] -------------------------------------------------------------------------------- Debridement Details Patient Name: [name] Date of Service: 03/12/2016 1:30 PM Medical Record Patient Account Number: [id number] Number: Treating RN: Montey Hora 1950/09/04 (65 y.o. Other Clinician: Date of Birth/Sex: Female) Treating ROBSON, MICHAEL Primary Care Physician/Extender: G LADA, [place] Physician: Referring Physician: LADA, MELINDA Weeks in Treatment: 16 Debridement Performed for Wound #4 Left,Proximal,Lateral Lower Leg Assessment: Performed By: Physician Ricard Dillon, MD Debridement: Debridement Pre-procedure Yes - 14:19 Verification/Time Out Taken: Start Time: 14:19 Pain Control: Lidocaine 4% Topical Solution Level: Skin/Subcutaneous Tissue Total Area Debrided (L x 0.4 (cm) x 0.4 (cm) = 0.16 (cm) W): Tissue and other Viable, Non-Viable, Fibrin/Slough, Subcutaneous material debrided: Instrument: Curette Bleeding: Minimum Hemostasis Achieved: Pressure End Time: 14:21 Procedural Pain: 0 Post Procedural Pain: 0 Response to Treatment: Procedure was tolerated well Post  Debridement Measurements of Total Wound Length: (cm) 0.4 Width: (cm) 0.4 Depth: (cm) 0.1 Volume: (cm) 0.013 Character of Wound/Ulcer Post Improved Debridement: Severity of Tissue Post Debridement: Fat layer exposed Post Procedure Diagnosis Same as Pre-procedure Electronic Signature(s) Signed: 03/12/2016 4:35:41 PM By: Baltazar Apo ([id number]) Signed: 03/12/2016 4:42:06 PM By: Linton Ham MD Entered By: Linton Ham on 03/12/2016 14:29:35 [name] -------------------------------------------------------------------------------- Debridement Details Patient Name: [name] Date of Service: 03/12/2016 1:30 PM Medical Record Patient Account Number: [id number] Number: Treating RN: Montey Hora 08/19/1950 (65 y.o. Other Clinician: Date of Birth/Sex: Female) Treating ROBSON, MICHAEL Primary Care Physician/Extender: G LADA, [place] Physician: Referring Physician: LADA, MELINDA Weeks in Treatment: 16 Debridement Performed for Wound #5 Left,Distal,Lateral Lower Leg Assessment: Performed By: Physician Ricard Dillon, MD Debridement: Debridement Pre-procedure Yes - 14:21 Verification/Time Out Taken: Start Time: 14:21 Pain Control: Lidocaine 4% Topical Solution Level: Skin/Subcutaneous Tissue Total Area Debrided (L x 2 (cm) x 1.4 (cm) = 2.8 (cm) W): Tissue and other Viable, Non-Viable, Fibrin/Slough, Subcutaneous material debrided: Instrument: Curette Bleeding: Minimum Hemostasis Achieved: Pressure End Time: 14:23 Procedural Pain: 0 Post Procedural Pain: 0 Response to Treatment: Procedure was tolerated well Post Debridement Measurements of Total Wound Length: (cm) 2 Width: (cm) 1.4 Depth: (cm) 0.2 Volume: (cm) 0.44 Character of Wound/Ulcer Post Improved Debridement: Severity of Tissue Post Debridement: Fat layer exposed Post Procedure Diagnosis Same as Pre-procedure Electronic Signature(s) Signed:  03/12/2016 4:35:41 PM By: Baltazar Apo ([id number]) Signed: 03/12/2016 4:42:06 PM By: Linton Ham MD Entered By: Linton Ham on 03/12/2016 14:29:59 [name] -------------------------------------------------------------------------------- HPI Details Patient Name: [name] Date of Service: 03/12/2016 1:30 PM Medical Record Patient Account Number: [id number] Number: Treating RN: Montey Hora 1950/08/14 (65 y.o. Other Clinician: Date of Birth/Sex: Female) Treating ROBSON, MICHAEL Primary Care Physician/Extender: Peri Jefferson, [place] Physician: Referring Physician: LADA, MELINDA Weeks in Treatment: 16 History of Present Illness Location: left and right lower extremity and dorsum of the right foot Quality: Patient reports experiencing a  sharp pain to affected area(s). Severity: Patient rates the pain to be a 8 out of 10 especially with palpation Duration: Patient has had the wound for > 6 months prior to seeking treatment at the wound center Timing: Pain in wound is Intermittent in severity but persistent Context: The wound appeared gradually over time Modifying Factors: Other treatment(s) tried include: symptomatic treatment as advised by her PCP Associated Signs and Symptoms: Patient reports having increase swelling iin her bilateral lower extremities HPI Description: 65 year old with a past medical history significant for MS, urinary incontinence, and obesity. She has been seen in the wound clinic before for lower extremity ulcerations treated with compression therapy. she is also known to have hypertension, peripheral vascular disease, COPD, obstructive sleep apnea, lumbar radiculopathy, kyphoscoliosis, urinary issues and tobacco abuse. Smokes a packet of cigarettes a day was recently seen at the [facility] for swelling of her legs and feet with a ulceration on the dorsum of the right foot which has been there  for about 6 months. she was recently in the ER about a month ago where she was seen for shortness of breath and swelling of the legs and a chest x-ray was within normal limits had an increase in her BNP and was given Lasix and put on doxycycline for a mild cellulitis and possible UTI. Wounds aresmaller today 11/13/15. Debrided and will continue Santyl. 12/21/2015 -- she was admitted to the hospital overnight on 12/18/2015 and diagnosed with urinary retention and cellulitis of the left lower leg. is past to take clindamycin and use Santyl for her wounds. 01/15/2016 -- come back to see [place] for almost a month and continues to be noncompliant with her dressings 01/30/16 patient presents today for a follow-up visit concerning her ongoing bilateral lower extremity wounds. We have not seen her for the past 2 weeks although we are supposed to be seen her for weekly visits. She currently has a Foley catheter. She tells me that the wounds are intensely painful especially with pressure and palpation at this point in time. Fortunately she is having no interval signs or symptoms of systemic infection but unfortunately the wounds have not improved dramatically since we last saw her. She does have home health coming to take care of her as well. She is currently not in any compression wraps. 02/06/16 ON evaluation today patient continues to experience discomfort regarding her bilateral lower extremity ulcers. She has continued to tolerate the dressing changes at this point in time and continues to have a Foley catheter as well. Fortunately she is back this week in the past she has been somewhat noncompliant with follow-ups I'm glad to see her today. She tells me that her pain level varies but can be as [name], [name] H. ([id number]) high as a 7 out of 10 with manipulation of the wound. She tells me that she used to be on oxycodone which was managed by the pain clinic although she is no longer on that as she tells me  that she was actually smoking marijuana at the time and when this was found out they discontinued her pain medication. She no longer is taking anything pain medication wise and she also does not smoke cigarettes nor marijuana at this point. 02/12/16; this is a patient I don't believe I have previously seen. She has multiple sclerosis. She has 4 punched-out areas on the anterior lateral left leg 2 areas on the right these are all in the same condition. Reasonably small [dime size] wounds each  was some depth. Surface of these wounds does not look particularly healthy as there is adherent slough. There is no evidence of surrounding infection or inflammation. ABIs in this clinic were 0.87 on the right and 0.81 on the left. She is listed as having PAD and is a smoker. Not a diabetic 02/20/16 patient I gave antibiotics to last week for erythema around both wound sites on the left lateral leg and right lateral leg. This appears to be a lot better. One of the areas on the left leg is healed however she still has 3 punched-out open areas on the left lateral calf and one on the right lateral calf and one on the dorsal foot. She is an ex-smoker quitting 1 month ago. ABIs in this clinic were 0.87 on the right 0.81 on the left 03/12/16; this is a patient I really don't have a good sense of. It would appear for the first 5 or 6 weeks of her stay in this clinic she was cared for by Dr. Con Memos. She appeared on my schedule in mid October and I saw her twice. She has not been here however in 3 weeks. She has advanced Wound Care at home where she lives with her husband. She has multiple sclerosis. I saw her the first time she had 4 punched-out small wounds on the anterior lateral left leg it appears that she is now down to 2. She also had wounds on the lateral and medial aspect of her right leg which were also small and punched out. The only one that remains is on the right lateral. Because of the nature of her  wound I went ahead and ordered formal arterial studies this showed an ABI of 1 on the right and one on the left. TBI of 1.4 on the right and 0.79 on the left. She had normal waveforms. She was in the emergency room on 02/28/16; there is a noted edema of her left leg after a fall. She had a duplex ultrasound that showed no evidence for an acute DVT from the left groin to the popliteal fossa. The study was limited in the calf veins due to edema. Also noticeable that she had a left inguinal enlarged lymph node up to 6.2 cm. She also had a left knee x-ray that showed no acute findings and a right foot x-ray that showed soft tissue swelling but no radiographic evidence of osteomyelitis. The patient does stop smoking Electronic Signature(s) Signed: 03/12/2016 4:42:06 PM By: Linton Ham MD Entered By: Linton Ham on 03/12/2016 14:36:08 [name] ([id number]) -------------------------------------------------------------------------------- Physical Exam Details Patient Name: [name] Date of Service: 03/12/2016 1:30 PM Medical Record Patient Account Number: [id number] Number: Treating RN: Montey Hora [birth year] (65 y.o. Other Clinician: Date of Birth/Sex: Female) Treating ROBSON, MICHAEL Primary Care Physician/Extender: Peri Jefferson, [place] Physician: Referring Physician: LADA, MELINDA Weeks in Treatment: 16 Constitutional Sitting or standing Blood Pressure is within target range for patient.. Pulse regular and within target range for patient.Marland Kitchen Respirations regular, non-labored and within target range.. Temperature is normal and within the target range for the patient.. Patient's appearance is neat and clean. Appears in no acute distress. Well nourished and well developed.. Eyes Conjunctivae clear. No discharge.. Neck Neck supple and symmetrical. No masses or crepitus. Respiratory Respiratory effort is easy and symmetric bilaterally. Rate is normal at rest and on  room air.. Bilateral breath sounds are clear and equal in all lobes with no wheezes, rales or rhonchi.. Cardiovascular Femoral arteries without bruits  and pulses strong.. Pedal pulses palpable and strong bilaterally.. Pitting edema in the left leg, there would appear to be asymmetric in size up into including the left thigh. Gastrointestinal (GI) Abdomen is soft and non-distended without masses or tenderness. Bowel sounds active in all quadrants.. No liver or spleen enlargement or tenderness.. Lymphatic None palpable in the inguinal, cervical clavicular. Psychiatric No evidence of depression, anxiety, or agitation. Calm, cooperative, and communicative. Appropriate interactions and affect.. Notes Wound exam; the patient is down to 2 punched-out areas on the left leg and one on the right. This would appear to be an improvement. The 2 wounds on the lateral left leg are debrided of surface slough and circumferential skin and subcutaneous tissue there is no evidence of infection Electronic Signature(s) Signed: 03/12/2016 4:42:06 PM By: Linton Ham MD Entered By: Linton Ham on 03/12/2016 14:38:37 [name] ([id number]) -------------------------------------------------------------------------------- Physician Orders Details Patient Name: [name] Date of Service: 03/12/2016 1:30 PM Medical Record Patient Account Number: [id number] Number: Treating RN: Montey Hora 05-Oct-1950 (65 y.o. Other Clinician: Date of Birth/Sex: Female) Treating ROBSON, MICHAEL Primary Care Physician/Extender: Peri Jefferson, [place] Physician: Referring Physician: LADA, MELINDA Weeks in Treatment: 32 Verbal / Phone Orders: Yes Clinician: Dorthy, Joanna Read Back and Verified: Yes Diagnosis Coding Wound Cleansing Wound #3 Right Lower Leg o Cleanse wound with mild soap and water - HHRN to wash legs with soap and water o No tub bath. Wound #4 Left,Proximal,Lateral Lower Leg o  Cleanse wound with mild soap and water - HHRN to wash legs with soap and water o No tub bath. Wound #5 Left,Distal,Lateral Lower Leg o Cleanse wound with mild soap and water - HHRN to wash legs with soap and water o No tub bath. Wound #7 Left,Medial Lower Leg o Cleanse wound with mild soap and water - HHRN to wash legs with soap and water o No tub bath. Anesthetic Wound #3 Right Lower Leg o Topical Lidocaine 4% cream applied to wound bed prior to debridement - in clinic only Wound #4 Left,Proximal,Lateral Lower Leg o Topical Lidocaine 4% cream applied to wound bed prior to debridement - in clinic only Wound #5 Left,Distal,Lateral Lower Leg o Topical Lidocaine 4% cream applied to wound bed prior to debridement - in clinic only Wound #7 Left,Medial Lower Leg o Topical Lidocaine 4% cream applied to wound bed prior to debridement - in clinic only Primary Wound Dressing Wound #3 Right Lower Leg o Prisma Ag - or collagen with silver equivalent - silver alginate is not collagen Latulippe, Dorthea H. ([id number]) Wound #4 Left,Proximal,Lateral Lower Leg o Prisma Ag - or collagen with silver equivalent - silver alginate is not collagen Wound #5 Left,Distal,Lateral Lower Leg o Prisma Ag - or collagen with silver equivalent - silver alginate is not collagen Wound #7 Left,Medial Lower Leg o Prisma Ag - or collagen with silver equivalent - silver alginate is not collagen Secondary Dressing Wound #3 Right Lower Leg o ABD pad o Dry Gauze Wound #4 Left,Proximal,Lateral Lower Leg o ABD pad o Dry Gauze Wound #5 Left,Distal,Lateral Lower Leg o ABD pad o Dry Gauze Wound #7 Left,Medial Lower Leg o ABD pad o Dry Gauze Dressing Change Frequency Wound #3 Right Lower Leg o Three times weekly - [facility] to change Monday and Friday Pt comes to clinic Wednesday Wound #4 Left,Proximal,Lateral Lower Leg o Three times weekly - [facility] to change Monday and Friday Pt comes  to clinic Wednesday Wound #5 Left,Distal,Lateral Lower Leg o Three times weekly -  [facility] to change Monday and Friday Pt comes to clinic Wednesday Wound #7 Left,Medial Lower Leg o Three times weekly - [facility] to change Monday and Friday Pt comes to clinic Wednesday Follow-up Appointments Wound #3 Right Lower Leg o Return Appointment in 1 week. [name], [name] ([id number]) Wound #4 Left,Proximal,Lateral Lower Leg o Return Appointment in 1 week. Wound #5 Left,Distal,Lateral Lower Leg o Return Appointment in 1 week. Wound #7 Left,Medial Lower Leg o Return Appointment in 1 week. Edema Control Wound #3 Right Lower Leg o 4 Layer Compression System - Bilateral - use 1 layer of unna wrap to anchor at the top o Patient to wear own compression stockings - HHRN please order compression hose for patient 20-77mmHg o Elevate legs to the level of the heart and pump ankles as often as possible Wound #4 Left,Proximal,Lateral Lower Leg o 4 Layer Compression System - Bilateral - use 1 layer of unna wrap to anchor at the top o Patient to wear own compression stockings - HHRN please order compression hose for patient 20-36mmHg o Elevate legs to the level of the heart and pump ankles as often as possible Wound #5 Left,Distal,Lateral Lower Leg o 4 Layer Compression System - Bilateral - use 1 layer of unna wrap to anchor at the top o Patient to wear own compression stockings - HHRN please order compression hose for patient 20-48mmHg o Elevate legs to the level of the heart and pump ankles as often as possible Wound #7 Left,Medial Lower Leg o 4 Layer Compression System - Bilateral - use 1 layer of unna wrap to anchor at the top o Patient to wear own compression stockings - HHRN please order compression hose for patient 20-84mmHg o Elevate legs to the level of the heart and pump ankles as often as possible Additional Orders / Instructions Wound #3 Right Lower Leg o Stop  Smoking o Increase protein intake. Wound #4 Left,Proximal,Lateral Lower Leg o Stop Smoking o Increase protein intake. Wound #5 Left,Distal,Lateral Lower Leg o Stop Smoking o Increase protein intake. [name], [name] ([id number]) Wound #7 Left,Medial Lower Leg o Stop Smoking o Increase protein intake. Home Health Wound #3 Right Lower Leg o [place] Visits - Advanced---[facility] to change wraps Monday and Friday *********[facility] please order compression hose for patient 20-59mmHg********* o Home Health Nurse may visit PRN to address patientos wound care needs. o FACE TO FACE ENCOUNTER: MEDICARE and MEDICAID PATIENTS: I certify that this patient is under my care and that I had a face-to-face encounter that meets the physician face-to-face encounter requirements with this patient on this date. The encounter with the patient was in whole or in part for the following MEDICAL CONDITION: (primary reason for [place]) MEDICAL NECESSITY: I certify, that based on my findings, NURSING services are a medically necessary home health service. HOME BOUND STATUS: I certify that my clinical findings support that this patient is homebound (i.e., Due to illness or injury, pt requires aid of supportive devices such as crutches, cane, wheelchairs, walkers, the use of special transportation or the assistance of another person to leave their place of residence. There is a normal inability to leave the home and doing so requires considerable and taxing effort. Other absences are for medical reasons / religious services and are infrequent or of short duration when for other reasons). o If current dressing causes regression in wound condition, may D/C ordered dressing product/s and apply Normal Saline Moist Dressing daily until next [place] / Other MD appointment.  [place] of regression in wound condition at [phone]. o Please direct any  NON-WOUND related issues/requests for orders to patient's Primary Care Physician Wound #4 Left,Proximal,Lateral Lower Leg o [place] Visits - Advanced---[facility] to change wraps Monday and Friday *********[facility] please order compression hose for patient 20-34mmHg********* o Home Health Nurse may visit PRN to address patientos wound care needs. o FACE TO FACE ENCOUNTER: MEDICARE and MEDICAID PATIENTS: I certify that this patient is under my care and that I had a face-to-face encounter that meets the physician face-to-face encounter requirements with this patient on this date. The encounter with the patient was in whole or in part for the following MEDICAL CONDITION: (primary reason for [place]) MEDICAL NECESSITY: I certify, that based on my findings, NURSING services are a medically necessary home health service. HOME BOUND STATUS: I certify that my clinical findings support that this patient is homebound (i.e., Due to illness or injury, pt requires aid of supportive devices such as crutches, cane, wheelchairs, walkers, the use of special transportation or the assistance of another person to leave their place of residence. There is a normal inability to leave the home and doing so requires considerable and taxing effort. Other absences are for medical reasons / religious services and are infrequent or of short duration when for other reasons). o If current dressing causes regression in wound condition, may D/C ordered dressing product/s and apply Normal Saline Moist Dressing daily until next [place] / Other MD appointment. [place] of regression in wound condition at [phone]. o Please direct any NON-WOUND related issues/requests for orders to patient's Primary Care Physician [name], [name] ([id number]) Wound #5 Left,Distal,Lateral Lower Leg o [place] Visits - Advanced---[facility] to change wraps Monday and  Friday *********[facility] please order compression hose for patient 20-34mmHg********* o Home Health Nurse may visit PRN to address patientos wound care needs. o FACE TO FACE ENCOUNTER: MEDICARE and MEDICAID PATIENTS: I certify that this patient is under my care and that I had a face-to-face encounter that meets the physician face-to-face encounter requirements with this patient on this date. The encounter with the patient was in whole or in part for the following MEDICAL CONDITION: (primary reason for [place]) MEDICAL NECESSITY: I certify, that based on my findings, NURSING services are a medically necessary home health service. HOME BOUND STATUS: I certify that my clinical findings support that this patient is homebound (i.e., Due to illness or injury, pt requires aid of supportive devices such as crutches, cane, wheelchairs, walkers, the use of special transportation or the assistance of another person to leave their place of residence. There is a normal inability to leave the home and doing so requires considerable and taxing effort. Other absences are for medical reasons / religious services and are infrequent or of short duration when for other reasons). o If current dressing causes regression in wound condition, may D/C ordered dressing product/s and apply Normal Saline Moist Dressing daily until next [place] / Other MD appointment. [place] of regression in wound condition at [phone]. o Please direct any NON-WOUND related issues/requests for orders to patient's Primary Care Physician Wound #7 Left,Medial Lower Leg o [place] Visits - Advanced---[facility] to change wraps Monday and Friday *********[facility] please order compression hose for patient 20-60mmHg********* o Home Health Nurse may visit PRN to address patientos wound care needs. o FACE TO FACE ENCOUNTER: MEDICARE and MEDICAID PATIENTS: I certify that this patient  is under my care and that I had a face-to-face encounter that meets the physician face-to-face encounter requirements with this patient on this date. The encounter with the patient was in whole or in part for the following MEDICAL CONDITION: (primary reason for [place]) MEDICAL NECESSITY: I certify, that based on my findings, NURSING services are a medically necessary home health service. HOME BOUND STATUS: I certify that my clinical findings support that this patient is homebound (i.e., Due to illness or injury, pt requires aid of supportive devices such as crutches, cane, wheelchairs, walkers, the use of special transportation or the assistance of another person to leave their place of residence. There is a normal inability to leave the home and doing so requires considerable and taxing effort. Other absences are for medical reasons / religious services and are infrequent or of short duration when for other reasons). o If current dressing causes regression in wound condition, may D/C ordered dressing product/s and apply Normal Saline Moist Dressing daily until next [place] / Other MD appointment. [place] of regression in wound condition at [phone]. o Please direct any NON-WOUND related issues/requests for orders to patient's Primary Care Physician Electronic Signature(s) Signed: 03/12/2016 4:35:41 PM By: Montey Hora Signed: 03/12/2016 4:42:06 PM By: Linton Ham MD [name] ([id number]) Entered By: Montey Hora on 03/12/2016 14:28:31 [name] ([id number]) -------------------------------------------------------------------------------- Problem List Details Patient Name: [name] Date of Service: 03/12/2016 1:30 PM Medical Record Patient Account Number: [id number] Number: Treating RN: Montey Hora [birth year] (65 y.o. 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 11/20/2015 Yes exposed F17.218 Nicotine dependence, cigarettes, with other nicotine- 11/20/2015 Yes induced disorders Inactive Problems Resolved Problems Electronic Signature(s) Signed: 03/12/2016 4:42:06 PM By: Linton Ham MD Entered By: Linton Ham on 03/12/2016 14:28:37 [name], [name] ([id number] [name], [name] ([id number]) -------------------------------------------------------------------------------- Progress Note Details Patient Name: [name] Date of Service: 03/12/2016 1:30 PM Medical Record Patient Account Number: [id number] Number: Treating RN: Montey Hora 1951-01-19 (65 y.o. Other Clinician: Date of Birth/Sex: Female) Treating ROBSON, MICHAEL Primary Care Physician/Extender: G LADA, [place] Physician: Referring Physician: LADA, MELINDA Weeks in Treatment: 16 Subjective Chief Complaint Information obtained from Patient Patient seen for complaints of Non-Healing Wound to the left and right lower extremity and the right dorsum of the foot History of Present Illness (HPI) The following HPI elements were documented for the patient's wound: Location: left and right lower extremity and dorsum of the right foot Quality: Patient reports experiencing a sharp pain to affected area(s). Severity: Patient rates the pain to be a 8 out of 10 especially with palpation Duration: Patient has had the wound for > 6 months prior  to seeking treatment at the wound center Timing: Pain in wound is Intermittent in severity but persistent Context: The wound appeared gradually over time Modifying Factors: Other treatment(s) tried include: symptomatic treatment as advised by her PCP Associated Signs and Symptoms: Patient reports having increase swelling iin her bilateral lower extremities 65 year old with a past medical  history significant for MS, urinary incontinence, and obesity. She has been seen in the wound clinic before for lower extremity ulcerations treated with compression therapy. she is also known to have hypertension, peripheral vascular disease, COPD, obstructive sleep apnea, lumbar radiculopathy, kyphoscoliosis, urinary issues and tobacco abuse. Smokes a packet of cigarettes a day was recently seen at the [facility] for swelling of her legs and feet with a ulceration on the dorsum of the right foot which has been there for about 6 months. she was recently in the ER about a month ago where she was seen for shortness of breath and swelling of the legs and a chest x-ray was within normal limits had an increase in her BNP and was given Lasix and put on doxycycline for a mild cellulitis and possible UTI. Wounds aresmaller today 11/13/15. Debrided and will continue Santyl. 12/21/2015 -- she was admitted to the hospital overnight on 12/18/2015 and diagnosed with urinary retention and cellulitis of the left lower leg. is past to take clindamycin and use Santyl for her wounds. 01/15/2016 -- come back to see [place] for almost a month and continues to be noncompliant with her dressings 01/30/16 patient presents today for a follow-up visit concerning her ongoing bilateral lower extremity wounds. We have not seen her for the past 2 weeks although we are supposed to be seen her for weekly visits. She currently has a Foley catheter. She tells me that the wounds are intensely painful especially with pressure and  palpation at this point in time. Fortunately she is having no interval signs or symptoms of systemic [name], [name] H. ([id number]) infection but unfortunately the wounds have not improved dramatically since we last saw her. She does have home health coming to take care of her as well. She is currently not in any compression wraps. 02/06/16 ON evaluation today patient continues to experience discomfort regarding her bilateral lower extremity ulcers. She has continued to tolerate the dressing changes at this point in time and continues to have a Foley catheter as well. Fortunately she is back this week in the past she has been somewhat noncompliant with follow-ups I'm glad to see her today. She tells me that her pain level varies but can be as high as a 7 out of 10 with manipulation of the wound. She tells me that she used to be on oxycodone which was managed by the pain clinic although she is no longer on that as she tells me that she was actually smoking marijuana at the time and when this was found out they discontinued her pain medication. She no longer is taking anything pain medication wise and she also does not smoke cigarettes nor marijuana at this point. 02/12/16; this is a patient I don't believe I have previously seen. She has multiple sclerosis. She has 4 punched-out areas on the anterior lateral left leg 2 areas on the right these are all in the same condition. Reasonably small [dime size] wounds each was some depth. Surface of these wounds does not look particularly healthy as there is adherent slough. There is no evidence of surrounding infection or inflammation. ABIs in this clinic were 0.87 on the right and 0.81 on the left. She is listed as having PAD and is a smoker. Not a diabetic 02/20/16 patient I gave antibiotics to last week for erythema around both wound sites on the left lateral leg and right lateral leg. This appears to be a lot better. One of the areas on the left  leg is  healed however she still has 3 punched-out open areas on the left lateral calf and one on the right lateral calf and one on the dorsal foot. She is an ex-smoker quitting 1 month ago. ABIs in this clinic were 0.87 on the right 0.81 on the left 03/12/16; this is a patient I really don't have a good sense of. It would appear for the first 5 or 6 weeks of her stay in this clinic she was cared for by Dr. Con Memos. She appeared on my schedule in mid October and I saw her twice. She has not been here however in 3 weeks. She has advanced Wound Care at home where she lives with her husband. She has multiple sclerosis. I saw her the first time she had 4 punched-out small wounds on the anterior lateral left leg it appears that she is now down to 2. She also had wounds on the lateral and medial aspect of her right leg which were also small and punched out. The only one that remains is on the right lateral. Because of the nature of her wound I went ahead and ordered formal arterial studies this showed an ABI of 1 on the right and one on the left. TBI of 1.4 on the right and 0.79 on the left. She had normal waveforms. She was in the emergency room on 02/28/16; there is a noted edema of her left leg after a fall. She had a duplex ultrasound that showed no evidence for an acute DVT from the left groin to the popliteal fossa. The study was limited in the calf veins due to edema. Also noticeable that she had a left inguinal enlarged lymph node up to 6.2 cm. She also had a left knee x-ray that showed no acute findings and a right foot x-ray that showed soft tissue swelling but no radiographic evidence of osteomyelitis. The patient does stop smoking Objective Constitutional Sitting or standing Blood Pressure is within target range for patient.. Pulse regular and within target range for patient.Marland Kitchen Respirations regular, non-labored and within target range.. Temperature is normal and within the target range for the  patient.. Patient's appearance is neat and clean. Appears in no acute distress. Well nourished and well developed.[name], [name] Vitals Time Taken: 1:54 PM, Height: 63 in, Weight: 270 lbs, BMI: 47.8, Temperature: 98.4 F, Pulse: 72 bpm, Respiratory Rate: 20 breaths/min, Blood Pressure: 95/62 mmHg. Eyes Conjunctivae clear. No discharge.. Neck Neck supple and symmetrical. No masses or crepitus. Respiratory Respiratory effort is easy and symmetric bilaterally. Rate is normal at rest and on room air.. Bilateral breath sounds are clear and equal in all lobes with no wheezes, rales or rhonchi.. Cardiovascular Femoral arteries without bruits and pulses strong.. Pedal pulses palpable and strong bilaterally.. Pitting edema in the left leg, there would appear to be asymmetric in size up into including the left thigh. Gastrointestinal (GI) Abdomen is soft and non-distended without masses or tenderness. Bowel sounds active in all quadrants.. No liver or spleen enlargement or tenderness.. Lymphatic None palpable in the inguinal, cervical clavicular. Psychiatric No evidence of depression, anxiety, or agitation. Calm, cooperative, and communicative. Appropriate interactions and affect.. General Notes: Wound exam; the patient is down to 2 punched-out areas on the left leg and one on the right. This would appear to be an improvement. The 2 wounds on the lateral left leg are debrided of surface slough and circumferential skin and subcutaneous tissue there is no evidence of infection Integumentary (Hair, Skin)  Wound #2 status is Healed - Epithelialized. Original cause of wound was Trauma. The wound is located on the Right,Dorsal Foot. The wound measures 0cm length x 0cm width x 0cm depth; 0cm^2 area and 0cm^3 volume. The wound is limited to skin breakdown. There is no tunneling or undermining noted. There is a medium amount of serous drainage noted. The wound margin is flat and intact.  There is no granulation within the wound bed. There is no necrotic tissue within the wound bed. The periwound skin appearance exhibited: Localized Edema, Ecchymosis, Erythema. The periwound skin appearance did not exhibit: Callus, Crepitus, Excoriation, Fluctuance, Friable, Induration, Rash, Scarring, Dry/Scaly, Maceration, Moist, Atrophie Blanche, Cyanosis, Hemosiderin Staining, Mottled, Pallor, Rubor. The surrounding wound skin color is noted with erythema which is circumferential. Periwound temperature was noted as No Abnormality. The periwound has tenderness on palpation. Wound #3 status is Open. Original cause of wound was Trauma. The wound is located on the Right Lower Leg. The wound measures 1.3cm length x 0.9cm width x 0.1cm depth; 0.919cm^2 area and 0.092cm^3 volume. The wound is limited to skin breakdown. There is no tunneling or undermining noted. There is a large amount of serous drainage noted. The wound margin is flat and intact. There is medium (34-66%) [name], [name] H. ([id number]) pink granulation within the wound bed. There is a medium (34-66%) amount of necrotic tissue within the wound bed including Adherent Slough. The periwound skin appearance exhibited: Erythema. The periwound skin appearance did not exhibit: Callus, Crepitus, Excoriation, Fluctuance, Friable, Induration, Localized Edema, Rash, Scarring, Dry/Scaly, Maceration, Moist, Atrophie Blanche, Cyanosis, Ecchymosis, Hemosiderin Staining, Mottled, Pallor, Rubor. The surrounding wound skin color is noted with erythema which is circumferential. Periwound temperature was noted as No Abnormality. The periwound has tenderness on palpation. Wound #4 status is Open. Original cause of wound was Trauma. The wound is located on the Left,Proximal,Lateral Lower Leg. The wound measures 0.4cm length x 0.4cm width x 0.1cm depth; 0.126cm^2 area and 0.013cm^3 volume. The wound is limited to skin breakdown. There is no tunneling  or undermining noted. There is a large amount of serous drainage noted. The wound margin is flat and intact. There is medium (34-66%) pale granulation within the wound bed. There is a medium (34-66%) amount of necrotic tissue within the wound bed including Adherent Slough. The periwound skin appearance exhibited: Excoriation, Moist, Erythema. The periwound skin appearance did not exhibit: Callus, Crepitus, Fluctuance, Friable, Induration, Localized Edema, Rash, Scarring, Dry/Scaly, Maceration, Atrophie Blanche, Cyanosis, Ecchymosis, Hemosiderin Staining, Mottled, Pallor, Rubor. The surrounding wound skin color is noted with erythema which is circumferential. Periwound temperature was noted as No Abnormality. The periwound has tenderness on palpation. Wound #5 status is Open. Original cause of wound was Gradually Appeared. The wound is located on the Left,Distal,Lateral Lower Leg. The wound measures 2cm length x 1.4cm width x 0.2cm depth; 2.199cm^2 area and 0.44cm^3 volume. The wound is limited to skin breakdown. There is no tunneling or undermining noted. There is a large amount of serous drainage noted. The wound margin is flat and intact. There is medium (34-66%) pink granulation within the wound bed. There is a medium (34-66%) amount of necrotic tissue within the wound bed including Adherent Slough. The periwound skin appearance exhibited: Moist, Erythema. The periwound skin appearance did not exhibit: Callus, Crepitus, Excoriation, Fluctuance, Friable, Induration, Localized Edema, Rash, Scarring, Dry/Scaly, Maceration, Atrophie Blanche, Cyanosis, Ecchymosis, Hemosiderin Staining, Mottled, Pallor, Rubor. The surrounding wound skin color is noted with erythema which is circumferential. Periwound temperature was noted as No  Abnormality. The periwound has tenderness on palpation. Wound #7 status is Open. Original cause of wound was Gradually Appeared. The wound is located on the Left,Medial  Lower Leg. The wound measures 0.3cm length x 0.7cm width x 0.1cm depth; 0.165cm^2 area and 0.016cm^3 volume. The wound is limited to skin breakdown. There is no tunneling or undermining noted. There is a large amount of serous drainage noted. The wound margin is flat and intact. There is large (67-100%) pink granulation within the wound bed. There is no necrotic tissue within the wound bed. The periwound skin appearance exhibited: Moist, Erythema. The surrounding wound skin color is noted with erythema which is circumferential. Periwound temperature was noted as No Abnormality. The periwound has tenderness on palpation. Assessment Active Problems ICD-10 G35 - Multiple sclerosis [name], [name] H. 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 There was a Skin/Subcutaneous Tissue Debridement [id number] debridement with total area of 0.16 sq cm performed by Ricard Dillon, MD. with the following instrument(s): Curette to remove Viable and Non-Viable tissue/material including Fibrin/Slough and Subcutaneous after achieving pain control using Lidocaine 4% Topical Solution. A time out was conducted at 14:19, prior to the start of the procedure. A Minimum amount of bleeding was controlled with Pressure. The procedure was tolerated well with a pain level of 0 throughout and a pain level of 0 following the procedure. Post Debridement Measurements: 0.4cm length x 0.4cm width x 0.1cm depth; 0.013cm^3 volume. Character of Wound/Ulcer Post Debridement is improved.  Severity of Tissue Post Debridement is: Fat layer exposed. Post procedure Diagnosis Wound #4: Same as Pre-Procedure Wound #5 Wound #5 is a Venous Leg Ulcer located on the Left,Distal,Lateral Lower Leg . There was a Skin/Subcutaneous Tissue Debridement [id number] debridement with total area of 2.8 sq cm performed by Ricard Dillon, MD. with the following instrument(s): Curette to remove Viable and Non-Viable tissue/material including Fibrin/Slough and Subcutaneous after achieving pain control using Lidocaine 4% Topical Solution. A time out was conducted at 14:21, prior to the start of the procedure. A Minimum amount of bleeding was controlled with Pressure. The procedure was tolerated well with a pain level of 0 throughout and a pain level of 0 following the procedure. Post Debridement Measurements: 2cm length x 1.4cm width x 0.2cm depth; 0.44cm^3 volume. Character of Wound/Ulcer Post Debridement is improved. Severity of Tissue Post Debridement is: Fat layer exposed. Post procedure Diagnosis Wound #5: Same as Pre-Procedure Plan Wound Cleansing: Wound #3 Right Lower Leg: Cleanse wound with mild soap and water - HHRN to wash legs with soap and water [name], [name] H. ([id number]) No tub bath. Wound #4 Left,Proximal,Lateral Lower Leg: Cleanse wound with mild soap and water - HHRN to wash legs with soap and water No tub bath. Wound #5 Left,Distal,Lateral Lower Leg: Cleanse wound with mild soap and water - HHRN to wash legs with soap and water No tub bath. Wound #7 Left,Medial Lower Leg: Cleanse wound with mild soap and water - HHRN to wash legs with soap and water No tub bath. Anesthetic: Wound #3 Right Lower Leg: Topical Lidocaine 4% cream applied to wound bed prior to  debridement - in clinic only Wound #4 Left,Proximal,Lateral Lower Leg: Topical Lidocaine 4% cream applied to wound bed prior to debridement - in clinic only Wound #5 Left,Distal,Lateral Lower Leg: Topical Lidocaine  4% cream applied to wound bed prior to debridement - in clinic only Wound #7 Left,Medial Lower Leg: Topical Lidocaine 4% cream applied to wound bed prior to debridement - in clinic only Primary Wound Dressing: Wound #3 Right Lower Leg: Prisma Ag - or collagen with silver equivalent - silver alginate is not collagen Wound #4 Left,Proximal,Lateral Lower Leg: Prisma Ag - or collagen with silver equivalent - silver alginate is not collagen Wound #5 Left,Distal,Lateral Lower Leg: Prisma Ag - or collagen with silver equivalent - silver alginate is not collagen Wound #7 Left,Medial Lower Leg: Prisma Ag - or collagen with silver equivalent - silver alginate is not collagen Secondary Dressing: Wound #3 Right Lower Leg: ABD pad Dry Gauze Wound #4 Left,Proximal,Lateral Lower Leg: ABD pad Dry Gauze Wound #5 Left,Distal,Lateral Lower Leg: ABD pad Dry Gauze Wound #7 Left,Medial Lower Leg: ABD pad Dry Gauze Dressing Change Frequency: Wound #3 Right Lower Leg: Three times weekly - [facility] to change Monday and Friday Pt comes to clinic Wednesday Wound #4 Left,Proximal,Lateral Lower Leg: Three times weekly - [facility] to change Monday and Friday Pt comes to clinic Wednesday Wound #5 Left,Distal,Lateral Lower Leg: Three times weekly - [facility] to change Monday and Friday Pt comes to clinic Wednesday Wound #7 Left,Medial Lower Leg: Three times weekly - [facility] to change Monday and Friday Pt comes to clinic Wednesday Follow-up Appointments: TANAISHA, POHL ([id number]) Wound #3 Right Lower Leg: Return Appointment in 1 week. Wound #4 Left,Proximal,Lateral Lower Leg: Return Appointment in 1 week. Wound #5 Left,Distal,Lateral Lower Leg: Return Appointment in 1 week. Wound #7 Left,Medial Lower Leg: Return Appointment in 1 week. Edema Control: Wound #3 Right Lower Leg: 4 Layer Compression System - Bilateral - use 1 layer of unna wrap to anchor at the top Patient to wear own compression stockings - HHRN please order  compression hose for patient 20- 7mmHg Elevate legs to the level of the heart and pump ankles as often as possible Wound #4 Left,Proximal,Lateral Lower Leg: 4 Layer Compression System - Bilateral - use 1 layer of unna wrap to anchor at the top Patient to wear own compression stockings - HHRN please order compression hose for patient 20- 79mmHg Elevate legs to the level of the heart and pump ankles as often as possible Wound #5 Left,Distal,Lateral Lower Leg: 4 Layer Compression System - Bilateral - use 1 layer of unna wrap to anchor at the top Patient to wear own compression stockings - HHRN please order compression hose for patient 20- 13mmHg Elevate legs to the level of the heart and pump ankles as often as possible Wound #7 Left,Medial Lower Leg: 4 Layer Compression System - Bilateral - use 1 layer of unna wrap to anchor at the top Patient to wear own compression stockings - HHRN please order compression hose for patient 20- 37mmHg Elevate legs to the level of the heart and pump ankles as often as possible Additional Orders / Instructions: Wound #3 Right Lower Leg: Stop Smoking Increase protein intake. Wound #4 Left,Proximal,Lateral Lower Leg: Stop Smoking Increase protein intake. Wound #5 Left,Distal,Lateral Lower Leg: Stop Smoking Increase protein intake. Wound #7 Left,Medial Lower Leg: Stop Smoking Increase protein intake. Home Health: Wound #3 Right Lower Leg: Continue Home Health Visits - [facility] to change wraps Monday and Friday *********[facility] please order compression hose for  patient 20-8mmHg********* Home Health Nurse may visit PRN to address patient s wound care needs. FACE TO FACE ENCOUNTER: MEDICARE and MEDICAID PATIENTS: I certify that this patient is under my care and that I had a face-to-face encounter that meets the physician face-to-face encounter requirements with this patient on this date. The encounter with the patient was in whole or in part for  the following MEDICAL CONDITION: (primary reason for [place]) MEDICAL NECESSITY: I certify, [name], [name] ([id number]) that based on my findings, NURSING services are a medically necessary home health service. HOME BOUND STATUS: I certify that my clinical findings support that this patient is homebound (i.e., Due to illness or injury, pt requires aid of supportive devices such as crutches, cane, wheelchairs, walkers, the use of special transportation or the assistance of another person to leave their place of residence. There is a normal inability to leave the home and doing so requires considerable and taxing effort. Other absences are for medical reasons / religious services and are infrequent or of short duration when for other reasons). If current dressing causes regression in wound condition, may D/C ordered dressing product/s and apply Normal Saline Moist Dressing daily until next [place] / Other MD appointment. [place] of regression in wound condition at [phone]. Please direct any NON-WOUND related issues/requests for orders to patient's Primary Care Physician Wound #4 Left,Proximal,Lateral Lower Leg: [place] Visits - [facility] to change wraps Monday and Friday *********[facility] please order compression hose for patient 20-63mmHg********* Home Health Nurse may visit PRN to address patient s wound care needs. FACE TO FACE ENCOUNTER: MEDICARE and MEDICAID PATIENTS: I certify that this patient is under my care and that I had a face-to-face encounter that meets the physician face-to-face encounter requirements with this patient on this date. The encounter with the patient was in whole or in part for the following MEDICAL CONDITION: (primary reason for [place]) MEDICAL NECESSITY: I certify, that based on my findings, NURSING services are a medically necessary home health service. HOME BOUND STATUS: I certify that my clinical  findings support that this patient is homebound (i.e., Due to illness or injury, pt requires aid of supportive devices such as crutches, cane, wheelchairs, walkers, the use of special transportation or the assistance of another person to leave their place of residence. There is a normal inability to leave the home and doing so requires considerable and taxing effort. Other absences are for medical reasons / religious services and are infrequent or of short duration when for other reasons). If current dressing causes regression in wound condition, may D/C ordered dressing product/s and apply Normal Saline Moist Dressing daily until next [place] / Other MD appointment. [place] of regression in wound condition at [phone]. Please direct any NON-WOUND related issues/requests for orders to patient's Primary Care Physician Wound #5 Left,Distal,Lateral Lower Leg: [place] Visits - [facility]---[facility] to change wraps Monday and Friday *********[facility] please order compression hose for patient 20-70mmHg********* Home Health Nurse may visit PRN to address patient s wound care needs. FACE TO FACE ENCOUNTER: MEDICARE and MEDICAID PATIENTS: I certify that this patient is under my care and that I had a face-to-face encounter that meets the physician face-to-face encounter requirements with this patient on this date. The encounter with the patient was in whole or in part for the following MEDICAL CONDITION: (primary reason for [place]) MEDICAL NECESSITY: I certify, that based on my findings, NURSING services are a  medically necessary home health service. HOME BOUND STATUS: I certify that my clinical findings support that this patient is homebound (i.e., Due to illness or injury, pt requires aid of supportive devices such as crutches, cane, wheelchairs, walkers, the use of special transportation or the assistance of another person to leave their place of  residence. There is a normal inability to leave the home and doing so requires considerable and taxing effort. Other absences are for medical reasons / religious services and are infrequent or of short duration when for other reasons). If current dressing causes regression in wound condition, may D/C ordered dressing product/s and apply Normal Saline Moist Dressing daily until next [place] / Other MD appointment. [place] of regression in wound condition at [phone]. Please direct any NON-WOUND related issues/requests for orders to patient's Primary Care Physician Wound #7 Left,Medial Lower Leg: [place] Visits - [facility]---[facility] to change wraps Monday and Friday *********[facility] please order compression hose for patient 20-22mmHg********* Home Health Nurse may visit PRN to address patient s wound care needs. FACE TO FACE ENCOUNTER: MEDICARE and MEDICAID PATIENTS: I certify that this patient is under ZALI, RODA. ([id number]) my care and that I had a face-to-face encounter that meets the physician face-to-face encounter requirements with this patient on this date. The encounter with the patient was in whole or in part for the following MEDICAL CONDITION: (primary reason for [place]) MEDICAL NECESSITY: I certify, that based on my findings, NURSING services are a medically necessary home health service. HOME BOUND STATUS: I certify that my clinical findings support that this patient is homebound (i.e., Due to illness or injury, pt requires aid of supportive devices such as crutches, cane, wheelchairs, walkers, the use of special transportation or the assistance of another person to leave their place of residence. There is a normal inability to leave the home and doing so requires considerable and taxing effort. Other absences are for medical reasons / religious services and are infrequent or of short duration when for other reasons). If current  dressing causes regression in wound condition, may D/C ordered dressing product/s and apply Normal Saline Moist Dressing daily until next [place] / Other MD appointment. [place] of regression in wound condition at [phone]. Please direct any NON-WOUND related issues/requests for orders to patient's Primary Care Physician #1 I do not have a good sense of the etiology of this patient's wounds. There were initially called venous however they are small punched out areas. Arterial studies that I ordered did not show significant arterial issues, her ABIs in T ABIs as well as her Doppler waveforms were normal bilaterally. In spite of the diagnostic uncertainty she appears to on from for wounds on the left leg down to 2 and the medial 1 on the right leg appears to of healed although the area on the right lateral leg is still present albeit filled in. #2 as a result of the normal arterial studies we can increase her compression the full Profore's versus Profore lites. I use Prisma as the primary dressing. #3 because of the asymmetry and the edema in her legs is unclear. She had a duplex ultrasound done at the emergency room earlier this week that was negative for DVT although I would wonder whether there may be major pelvic vein issues or compression. #4 the doctor who read her venous ultrasound commented on a 6.2 cm lymph node in her left groin I am uncertain about who if  anyone is trying to address this issue. #5 I reviewed her lab work in [facility] he had a normal CBC and basic metabolic panel on [id number] other than a slightly elevated creatinine at 1.54 giving her an estimated GFR of 34. Last comprehensive metabolic panel I see is from 2015 that was essentially normal other than mild renal insufficiency. In particular her liver function tests were normal. She had a normal differential 2 her white count in 2015 also Electronic Signature(s) Signed: 03/12/2016 4:42:06 PM  By: Linton Ham MD Entered By: Linton Ham on 03/12/2016 14:43:53 [place], [name] -------------------------------------------------------------------------------- SuperBill Details Patient Name: [name] Date of Service: 03/12/2016 Medical Record Patient Account Number: [id number] Number: Treating RN: Montey Hora [birth year] (65 y.o. 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 calf with fat layer exposed F17.218 Nicotine dependence, cigarettes, with other nicotine-induced disorders Facility Procedures CPT4 Code: [id number] Description: [id number] - DEB SUBQ TISSUE 20 SQ CM/< ICD-10 Description Diagnosis L97.222 Non-pressure chronic ulcer of left calf with fat l Modifier: ayer exposed Quantity: 1 Physician Procedures CPT4 Code: [id number] Description: 11042 - WC PHYS SUBQ TISS 20 SQ CM ICD-10 Description Diagnosis L97.222 Non-pressure chronic ulcer of left calf with fat l Modifier: ayer exposed Quantity: 1 Electronic Signature(s) Signed: 03/12/2016 4:42:06 PM By: Linton Ham MD Entered By: Linton Ham on 03/12/2016 14:44:26

## 2016-03-13 NOTE — Progress Notes (Signed)
REMAS, DEBARROS ([id number]) Visit Report for 03/12/2016 Arrival Information Details Patient Name: [name], [name] Date of Service: 03/12/2016 1:30 PM Medical Record Patient Account Number: [id number] Number: Treating RN: Montey Hora 12/04/1950 (65 y.o. Other Clinician: Date of Birth/Sex: Female) Treating ROBSON, [place] Primary Care Physician: LADA, MELINDA Physician/Extender: G Referring Physician: LADA, MELINDA Weeks in Treatment: 16 Visit Information History Since Last Visit Added or deleted any medications: No Patient Arrived: Wheel Chair Any new allergies or adverse reactions: No Arrival Time: 13:50 Had a fall or experienced change in No activities of daily living that may affect Accompanied By: self risk of falls: Transfer Assistance: None Signs or symptoms of abuse/neglect since last No Patient Identification Verified: Yes visito Secondary Verification Process Yes Hospitalized since last visit: No Completed: Pain Present Now: Yes Patient Requires Transmission-Based No Precautions: Patient Has Alerts: Yes Electronic Signature(s) Signed: 03/12/2016 4:35:41 PM By: Montey Hora Entered By: Montey Hora on 03/12/2016 13:50:58 [name] -------------------------------------------------------------------------------- Encounter Discharge Information Details Patient Name: [name] Date of Service: 03/12/2016 1:30 PM Medical Record Patient Account Number: [id number] Number: Treating RN: Montey Hora 1950-11-27 (65 y.o. Other Clinician: Date of Birth/Sex: Female) Treating ROBSON, [place] Primary Care Physician: LADA, MELINDA Physician/Extender: G Referring Physician: LADA, MELINDA Weeks in Treatment: 61 Encounter Discharge Information Items Discharge Pain Level: 0 Discharge Condition: Stable Ambulatory Status: Wheelchair Discharge Destination: Home Transportation: Private Auto Accompanied By: self Schedule  Follow-up Appointment: Yes Medication Reconciliation completed and provided to Patient/Care No Xzavion Doswell: Provided on Clinical Summary of Care: 03/12/2016 Form Type Recipient Paper Patient BW Electronic Signature(s) Signed: 03/12/2016 4:35:41 PM By: Montey Hora Previous Signature: 03/12/2016 2:51:57 PM Version By: Ruthine Dose Entered By: Montey Hora on 03/12/2016 15:05:55 [name], [name] -------------------------------------------------------------------------------- Multi Wound Chart Details Patient Name: [name] Date of Service: 03/12/2016 1:30 PM Medical Record Patient Account Number: [id number] Number: Treating RN: Montey Hora 01-19-1951 (65 y.o. Other Clinician: Date of Birth/Sex: Female) Treating ROBSON, MICHAEL Primary Care Physician: LADA, MELINDA Physician/Extender: G Referring Physician: LADA, MELINDA Weeks in Treatment: 16 Vital Signs Height(in): 63 Pulse(bpm): 72 Weight(lbs): 270 Blood Pressure 95/62 (mmHg): Body Mass Index(BMI): 48 Temperature(F): 98.4 Respiratory Rate 20 (breaths/min): Photos: Wound Location: Right Foot - Dorsal Right Lower Leg Left Lower Leg - Lateral, Proximal Wounding Event: Trauma Trauma Trauma Primary Etiology: Trauma, Other Trauma, Other Venous Leg Ulcer Comorbid History: Cataracts, Chronic Cataracts, Chronic Cataracts, Chronic Obstructive Pulmonary Obstructive Pulmonary Obstructive Pulmonary Disease (COPD), Sleep Disease (COPD), Sleep Disease (COPD), Sleep Apnea, Hypertension, Apnea, Hypertension, Apnea, Hypertension, Osteoarthritis, Neuropathy Osteoarthritis, Neuropathy Osteoarthritis, Neuropathy Date Acquired: 07/21/2015 10/09/2015 10/09/2015 Weeks of Treatment: 16 16 16  Wound Status: Healed - Epithelialized Open Open Clustered Wound: No No Yes Measurements L x W x D 0x0x0 1.3x0.9x0.1 0.4x0.4x0.1 (cm) Area (cm) : 0 0.919 0.126 Volume (cm) : 0 0.092 0.013 % Reduction in Area:  100.00% -85.70% -34.00% % Reduction in Volume: 100.00% 37.80% -44.40% Classification: Partial Thickness Partial Thickness Partial Thickness Exudate Amount: Medium Large Large Gornick, Elinor [name]. ([id number]) Exudate Type: Serous Serous Serous Exudate Color: amber amber amber Wound Margin: Flat and Intact Flat and Intact Flat and Intact Granulation Amount: None Present (0%) Medium (34-66%) Medium (34-66%) Granulation Quality: N/A Pink Pale Necrotic Amount: None Present (0%) Medium (34-66%) Medium (34-66%) Exposed Structures: Fascia: No Fascia: No Fascia: No Fat: No Fat: No Fat: No Tendon: No Tendon: No Tendon: No Muscle: No Muscle: No Muscle: No Joint: No Joint: No Joint: No Bone: No Bone: No Bone:  No Limited to Skin Limited to Skin Limited to Skin Breakdown Breakdown Breakdown Epithelialization: Large (67-100%) None None Debridement: N/A N/A Debridement [id number]) Pre-procedure N/A N/A 14:19 Verification/Time Out Taken: Pain Control: N/A N/A Lidocaine 4% Topical Solution Tissue Debrided: N/A N/A Fibrin/Slough, Subcutaneous Level: N/A N/A Skin/Subcutaneous Tissue Debridement Area (sq N/A N/A 0.16 cm): Instrument: N/A N/A Curette Bleeding: N/A N/A Minimum Hemostasis Achieved: N/A N/A Pressure Procedural Pain: N/A N/A 0 Post Procedural Pain: N/A N/A 0 Debridement Treatment N/A N/A Procedure was tolerated Response: well Post Debridement N/A N/A 0.4x0.4x0.1 Measurements L x W x D (cm) Post Debridement N/A N/A 0.013 Volume: (cm) Periwound Skin Texture: Edema: Yes Edema: No Excoriation: Yes Excoriation: No Excoriation: No Edema: No Induration: No Induration: No Induration: No Callus: No Callus: No Callus: No Crepitus: No Crepitus: No Crepitus: No Fluctuance: No Fluctuance: No Fluctuance: No Friable: No Friable: No Friable: No Rash: No Rash: No Rash: No Scarring: No Scarring: No Scarring: No Cubbage, [name]. ([id number]) Periwound Skin  Maceration: No Maceration: No Moist: Yes Moisture: Moist: No Moist: No Maceration: No Dry/Scaly: No Dry/Scaly: No Dry/Scaly: No Periwound Skin Color: Ecchymosis: Yes Erythema: Yes Erythema: Yes Erythema: Yes Atrophie Blanche: No Atrophie Blanche: No Atrophie Blanche: No Cyanosis: No Cyanosis: No Cyanosis: No Ecchymosis: No Ecchymosis: No Hemosiderin Staining: No Hemosiderin Staining: No Hemosiderin Staining: No Mottled: No Mottled: No Mottled: No Pallor: No Pallor: No Pallor: No Rubor: No Rubor: No Rubor: No Erythema Location: Circumferential Circumferential Circumferential Temperature: No Abnormality No Abnormality No Abnormality Tenderness on Yes Yes Yes Palpation: Wound Preparation: Ulcer Cleansing: Other: Ulcer Cleansing: Other: Ulcer Cleansing: Other: soap and water soap and water soap and water Topical Anesthetic Topical Anesthetic Topical Anesthetic Applied: None Applied: Other: lidocaine Applied: Other: lidocaine 4% 4% Procedures Performed: N/A N/A Debridement Wound Number: 5 7 N/A Photos: N/A Wound Location: Left Lower Leg - Lateral, Left Lower Leg - Medial N/A Distal Wounding Event: Gradually Appeared Gradually Appeared N/A Primary Etiology: Venous Leg Ulcer Venous Leg Ulcer N/A Comorbid History: Cataracts, Chronic Cataracts, Chronic N/A Obstructive Pulmonary Obstructive Pulmonary Disease (COPD), Sleep Disease (COPD), Sleep Apnea, Hypertension, Apnea, Hypertension, Osteoarthritis, Neuropathy Osteoarthritis, Neuropathy Date Acquired: 12/21/2015 02/06/2016 N/A Weeks of Treatment: 11 5 N/A Wound Status: Open Open N/A Clustered Wound: No No N/A Measurements L x W x D 2x1.4x0.2 0.3x0.7x0.1 N/A (cm) Area (cm) : 2.199 0.165 N/A Volume (cm) : 0.44 0.016 N/A % Reduction in Area: -299.80% 90.00% N/A % Reduction in Volume: -700.00% 90.30% N/A Ciolek, [name]. ([id number]) Classification: Partial Thickness Partial Thickness N/A Exudate Amount:  Large Large N/A Exudate Type: Serous Serous N/A Exudate Color: amber amber N/A Wound Margin: Flat and Intact Flat and Intact N/A Granulation Amount: Medium (34-66%) Large (67-100%) N/A Granulation Quality: Pink Pink N/A Necrotic Amount: Medium (34-66%) None Present (0%) N/A Exposed Structures: Fascia: No Fascia: No N/A Fat: No Fat: No Tendon: No Tendon: No Muscle: No Muscle: No Joint: No Joint: No Bone: No Bone: No Limited to Skin Limited to Skin Breakdown Breakdown Epithelialization: None None N/A Debridement: Debridement [id number]/A [id number]) Pre-procedure 14:21 N/A N/A Verification/Time Out Taken: Pain Control: Lidocaine 4% Topical N/A N/A Solution Tissue Debrided: Fibrin/Slough, N/A N/A Subcutaneous Level: Skin/Subcutaneous N/A N/A Tissue Debridement Area (sq 2.8 N/A N/A cm): Instrument: Curette N/A N/A Bleeding: Minimum N/A N/A Hemostasis Achieved: Pressure N/A N/A Procedural Pain: 0 N/A N/A Post Procedural Pain: 0 N/A N/A Debridement Treatment Procedure was tolerated N/A N/A Response: well Post Debridement 2x1.4x0.2 N/A N/A Measurements L  x W x D (cm) Post Debridement 0.44 N/A N/A Volume: (cm) Periwound Skin Texture: Edema: No No Abnormalities Noted N/A Excoriation: No Induration: No Callus: No Crepitus: No Fluctuance: No Friable: No Bartok, [name]. ([id number]) Rash: No Scarring: No Periwound Skin Moist: Yes Moist: Yes N/A Moisture: Maceration: No Dry/Scaly: No Periwound Skin Color: Erythema: Yes Erythema: Yes N/A Atrophie Blanche: No Cyanosis: No Ecchymosis: No Hemosiderin Staining: No Mottled: No Pallor: No Rubor: No Erythema Location: Circumferential Circumferential N/A Temperature: No Abnormality No Abnormality N/A Tenderness on Yes Yes N/A Palpation: Wound Preparation: Ulcer Cleansing: Other: Ulcer Cleansing: Other: N/A soap and water soap and water Topical Anesthetic Topical Anesthetic Applied: Other: lidocaine Applied:  Other: lidocaine 4% 4% Procedures Performed: Debridement N/A N/A Treatment Notes Electronic Signature(s) Signed: 03/12/2016 4:35:41 PM By: Montey Hora Entered By: Montey Hora on 03/12/2016 14:26:46 [name], [name] -------------------------------------------------------------------------------- Multi-Disciplinary Care Plan Details Patient Name: [name] Date of Service: 03/12/2016 1:30 PM Medical Record Patient Account Number: [id number] Number: Treating RN: Montey Hora [birth year] (65 y.o. Other Clinician: Date of Birth/Sex: Female) Treating ROBSON, [place] Primary Care Physician: LADA, MELINDA Physician/Extender: G Referring Physician: LADA, MELINDA Weeks in Treatment: 50 Active Inactive Abuse / Safety / Falls / Self Care Management Nursing Diagnoses: Impaired physical mobility Potential for falls Goals: Patient will remain injury free Date Initiated: 11/20/2015 Goal Status: Active Interventions: Assess fall risk on admission and as needed Notes: Nutrition Nursing Diagnoses: Imbalanced nutrition Goals: Patient/caregiver agrees to and verbalizes understanding of need to use nutritional supplements and/or vitamins as prescribed Date Initiated: 11/20/2015 Goal Status: Active Interventions: Provide education on nutrition Notes: Orientation to the Wound Care Program Nursing Diagnoses: Knowledge deficit related to the wound healing center program [name], [name] Goals: Patient/caregiver will verbalize understanding of the [place] Date Initiated: 11/20/2015 Goal Status: Active Interventions: Provide education on orientation to the wound center Notes: Wound/Skin Impairment Nursing Diagnoses: Impaired tissue integrity Goals: Patient will demonstrate a reduced rate of smoking or cessation of smoking Date Initiated: 11/20/2015 Goal Status: Active Ulcer/skin breakdown will heal within 14 weeks Date  Initiated: 11/20/2015 Goal Status: Active Interventions: Assess patient/caregiver ability to obtain necessary supplies Provide education on smoking Notes: Electronic Signature(s) Signed: 03/12/2016 4:35:41 PM By: Montey Hora Entered By: Montey Hora on 03/12/2016 14:26:29 [name] -------------------------------------------------------------------------------- Pain Assessment Details Patient Name: [name] Date of Service: 03/12/2016 1:30 PM Medical Record Patient Account Number: [id number] Number: Treating RN: Montey Hora 11-Feb-1951 (65 y.o. Other Clinician: Date of Birth/Sex: Female) Treating ROBSON, MICHAEL Primary Care Physician: LADA, MELINDA Physician/Extender: G Referring Physician: LADA, MELINDA Weeks in Treatment: 16 Active Problems Location of Pain Severity and Description of Pain Patient Has Paino Yes Site Locations Pain Location: Pain in Ulcers With Dressing Change: Yes Duration of the Pain. Constant / Intermittento Constant Pain Management and Medication Current Pain Management: Notes Topical or injectable lidocaine is offered to patient for acute pain when surgical debridement is performed. If needed, Patient is instructed to use over the counter pain medication for the following 24-48 hours after debridement. Wound care MDs do not prescribed pain medications. Patient has chronic pain or uncontrolled pain. Patient has been instructed to make an appointment with their Primary Care Physician for pain management. Electronic Signature(s) Signed: 03/12/2016 4:35:41 PM By: Montey Hora Entered By: Montey Hora on 03/12/2016 13:54:24 [name] -------------------------------------------------------------------------------- Patient/Caregiver Education Details Patient Name: [name] Date of Service: 03/12/2016 1:30 PM Medical Record Patient Account  Number:  [id number] Number: Treating RN: Montey Hora [birth year] (65 y.o. Other Clinician: Date of Birth/Gender: Female) Treating ROBSON, [place] Primary Care Physician: LADA, MELINDA Physician/Extender: G Referring Physician: LADA, MELINDA Weeks in Treatment: 36 Education Assessment Education Provided To: Patient Education Topics Provided Venous: Handouts: Other: leg elevation Methods: Explain/Verbal Responses: State content correctly Electronic Signature(s) Signed: 03/12/2016 4:35:41 PM By: Montey Hora Entered By: Montey Hora on 03/12/2016 15:06:19 [name], [name] ([id number]) -------------------------------------------------------------------------------- Wound Assessment Details Patient Name: [name] Date of Service: 03/12/2016 1:30 PM Medical Record Patient Account Number: [id number] Number: Treating RN: Montey Hora October 31, 1950 (65 y.o. Other Clinician: Date of Birth/Sex: Female) Treating ROBSON, MICHAEL Primary Care Physician: LADA, MELINDA Physician/Extender: G Referring Physician: LADA, MELINDA Weeks in Treatment: 16 Wound Status Wound Number: 2 Primary Trauma, Other Etiology: Wound Location: Right Foot - Dorsal Wound Healed - Epithelialized Wounding Event: Trauma Status: Date Acquired: 07/21/2015 Comorbid Cataracts, Chronic Obstructive Weeks Of Treatment: 16 History: Pulmonary Disease (COPD), Sleep Clustered Wound: No Apnea, Hypertension, Osteoarthritis, Neuropathy Photos Wound Measurements Length: (cm) 0 % Reduction in Width: (cm) 0 % Reduction in Depth: (cm) 0 Epithelializati Area: (cm) 0 Tunneling: Volume: (cm) 0 Undermining: Area: 100% Volume: 100% on: Large (67-100%) No No Wound Description Classification: Partial Thickness Wound Margin: Flat and Intact Exudate Amount: Medium Exudate Type: Serous Exudate Color: amber Wound Bed Granulation Amount: None Present (0%) Exposed Structure Necrotic Amount: None  Present (0%) Fascia Exposed: No Jasko, [name]. ([id number]) Fat Layer Exposed: No Tendon Exposed: No Muscle Exposed: No Joint Exposed: No Bone Exposed: No Limited to Skin Breakdown Periwound Skin Texture Texture Color No Abnormalities Noted: No No Abnormalities Noted: No Callus: No Atrophie Blanche: No Crepitus: No Cyanosis: No Excoriation: No Ecchymosis: Yes Fluctuance: No Erythema: Yes Friable: No Erythema Location: Circumferential Induration: No Hemosiderin Staining: No Localized Edema: Yes Mottled: No Rash: No Pallor: No Scarring: No Rubor: No Moisture Temperature / Pain No Abnormalities Noted: No Temperature: No Abnormality Dry / Scaly: No Tenderness on Palpation: Yes Maceration: No Moist: No Wound Preparation Ulcer Cleansing: Other: soap and water, Topical Anesthetic Applied: None Electronic Signature(s) Signed: 03/12/2016 4:35:41 PM By: Montey Hora Entered By: Montey Hora on 03/12/2016 14:18:59 [name], [name] ([id number]) -------------------------------------------------------------------------------- Wound Assessment Details Patient Name: [name] Date of Service: 03/12/2016 1:30 PM Medical Record Patient Account Number: [id number] Number: Treating RN: Montey Hora 11/14/1950 (65 y.o. Other Clinician: Date of Birth/Sex: Female) Treating ROBSON, MICHAEL Primary Care Physician: LADA, MELINDA Physician/Extender: G Referring Physician: LADA, MELINDA Weeks in Treatment: 16 Wound Status Wound Number: 3 Primary Trauma, Other Etiology: Wound Location: Right Lower Leg Wound Open Wounding Event: Trauma Status: Date Acquired: 10/09/2015 Comorbid Cataracts, Chronic Obstructive Weeks Of Treatment: 16 History: Pulmonary Disease (COPD), Sleep Clustered Wound: No Apnea, Hypertension, Osteoarthritis, Neuropathy Photos Wound Measurements Length: (cm) 1.3 % Reduction in Area Width: (cm) 0.9 % Reduction in Volu Depth: (cm)  0.1 Epithelialization: Area: (cm) 0.919 Tunneling: Volume: (cm) 0.092 Undermining: : -85.7% me: 37.8% None No No Wound Description Classification: Partial Thickness Wound Margin: Flat and Intact Exudate Amount: Large Exudate Type: Serous Exudate Color: amber Wound Bed Granulation Amount: Medium (34-66%) Exposed Structure Granulation Quality: Pink Fascia Exposed: No Colt, Viana [name]. ([id number]) Necrotic Amount: Medium (34-66%) Fat Layer Exposed: No Necrotic Quality: Adherent Slough Tendon Exposed: No Muscle Exposed: No Joint Exposed: No Bone Exposed: No Limited to Skin Breakdown Periwound Skin Texture Texture Color No Abnormalities Noted: No No Abnormalities Noted: No Callus: No Atrophie Blanche: No Crepitus: No  Cyanosis: No Excoriation: No Ecchymosis: No Fluctuance: No Erythema: Yes Friable: No Erythema Location: Circumferential Induration: No Hemosiderin Staining: No Localized Edema: No Mottled: No Rash: No Pallor: No Scarring: No Rubor: No Moisture Temperature / Pain No Abnormalities Noted: No Temperature: No Abnormality Dry / Scaly: No Tenderness on Palpation: Yes Maceration: No Moist: No Wound Preparation Ulcer Cleansing: Other: soap and water, Topical Anesthetic Applied: Other: lidocaine 4%, Treatment Notes Wound #3 (Right Lower Leg) 1. Cleansed with: Clean wound with Normal Saline Cleanse wound with antibacterial soap and water 2. Anesthetic Topical Lidocaine 4% cream to wound bed prior to debridement 4. Dressing Applied: Prisma Ag 5. Secondary Dressing Applied Dry Gauze 7. Secured with Tape 4 Layer Compression System - Bilateral Electronic Signature(s) Signed: 03/12/2016 4:35:41 PM By: [name], [name] ([id number]) Entered By: Montey Hora on 03/12/2016 14:24:09 [name] ([id number]) -------------------------------------------------------------------------------- Wound Assessment Details Patient Name:  [name] Date of Service: 03/12/2016 1:30 PM Medical Record Patient Account Number: [id number] Number: Treating RN: Montey Hora 11-10-1950 (65 y.o. Other Clinician: Date of Birth/Sex: Female) Treating ROBSON, MICHAEL Primary Care Physician: LADA, MELINDA Physician/Extender: G Referring Physician: LADA, MELINDA Weeks in Treatment: 16 Wound Status Wound Number: 4 Primary Venous Leg Ulcer Etiology: Wound Location: Left Lower Leg - Lateral, Proximal Wound Open Status: Wounding Event: Trauma Comorbid Cataracts, Chronic Obstructive Date Acquired: 10/09/2015 History: Pulmonary Disease (COPD), Sleep Weeks Of Treatment: 16 Apnea, Hypertension, Osteoarthritis, Clustered Wound: Yes Neuropathy Photos Wound Measurements Length: (cm) 0.4 Width: (cm) 0.4 Depth: (cm) 0.1 Area: (cm) 0.126 Volume: (cm) 0.013 % Reduction in Area: -34% % Reduction in Volume: -44.4% Epithelialization: None Tunneling: No Undermining: No Wound Description Classification: Partial Thickness Wound Margin: Flat and Intact Exudate Amount: Large Exudate Type: Serous Exudate Color: amber Wound Bed Granulation Amount: Medium (34-66%) Exposed Structure Granulation Quality: Pale Fascia Exposed: No Eaddy, Kandra [name]. ([id number]) Necrotic Amount: Medium (34-66%) Fat Layer Exposed: No Necrotic Quality: Adherent Slough Tendon Exposed: No Muscle Exposed: No Joint Exposed: No Bone Exposed: No Limited to Skin Breakdown Periwound Skin Texture Texture Color No Abnormalities Noted: No No Abnormalities Noted: No Callus: No Atrophie Blanche: No Crepitus: No Cyanosis: No Excoriation: Yes Ecchymosis: No Fluctuance: No Erythema: Yes Friable: No Erythema Location: Circumferential Induration: No Hemosiderin Staining: No Localized Edema: No Mottled: No Rash: No Pallor: No Scarring: No Rubor: No Moisture Temperature / Pain No Abnormalities Noted: No Temperature: No Abnormality Dry /  Scaly: No Tenderness on Palpation: Yes Maceration: No Moist: Yes Wound Preparation Ulcer Cleansing: Other: soap and water, Topical Anesthetic Applied: Other: lidocaine 4%, Treatment Notes Wound #4 (Left, Proximal, Lateral Lower Leg) 1. Cleansed with: Clean wound with Normal Saline Cleanse wound with antibacterial soap and water 2. Anesthetic Topical Lidocaine 4% cream to wound bed prior to debridement 4. Dressing Applied: Prisma Ag 5. Secondary Dressing Applied Dry Gauze 7. Secured with Tape 4 Layer Compression System - Bilateral Electronic Signature(s) Signed: 03/12/2016 4:35:41 PM By: [name], [name] ([id number]) Entered By: Montey Hora on 03/12/2016 14:25:03 [name] ([id number]) -------------------------------------------------------------------------------- Wound Assessment Details Patient Name: [name] Date of Service: 03/12/2016 1:30 PM Medical Record Patient Account Number: [id number] Number: Treating RN: Montey Hora May 11, 1950 (65 y.o. Other Clinician: Date of Birth/Sex: Female) Treating ROBSON, MICHAEL Primary Care Physician: LADA, MELINDA Physician/Extender: G Referring Physician: LADA, MELINDA Weeks in Treatment: 16 Wound Status Wound Number: 5 Primary Venous Leg Ulcer Etiology: Wound Location: Left Lower Leg - Lateral, Distal Wound Open Wounding Event: Gradually Appeared  Status: Date Acquired: 12/21/2015 Comorbid Cataracts, Chronic Obstructive Weeks Of Treatment: 11 History: Pulmonary Disease (COPD), Sleep Clustered Wound: No Apnea, Hypertension, Osteoarthritis, Neuropathy Photos Wound Measurements Length: (cm) 2 Width: (cm) 1.4 Depth: (cm) 0.2 Area: (cm) 2.199 Volume: (cm) 0.44 % Reduction in Area: -299.8% % Reduction in Volume: -700% Epithelialization: None Tunneling: No Undermining: No Wound Description Classification: Partial Thickness Wound Margin: Flat and Intact Exudate Amount:  Large Exudate Type: Serous Exudate Color: amber Wound Bed Granulation Amount: Medium (34-66%) Exposed Structure Granulation Quality: Pink Fascia Exposed: No Tschida, Abbrielle [name]. ([id number]) Necrotic Amount: Medium (34-66%) Fat Layer Exposed: No Necrotic Quality: Adherent Slough Tendon Exposed: No Muscle Exposed: No Joint Exposed: No Bone Exposed: No Limited to Skin Breakdown Periwound Skin Texture Texture Color No Abnormalities Noted: No No Abnormalities Noted: No Callus: No Atrophie Blanche: No Crepitus: No Cyanosis: No Excoriation: No Ecchymosis: No Fluctuance: No Erythema: Yes Friable: No Erythema Location: Circumferential Induration: No Hemosiderin Staining: No Localized Edema: No Mottled: No Rash: No Pallor: No Scarring: No Rubor: No Moisture Temperature / Pain No Abnormalities Noted: No Temperature: No Abnormality Dry / Scaly: No Tenderness on Palpation: Yes Maceration: No Moist: Yes Wound Preparation Ulcer Cleansing: Other: soap and water, Topical Anesthetic Applied: Other: lidocaine 4%, Treatment Notes Wound #5 (Left, Distal, Lateral Lower Leg) 1. Cleansed with: Clean wound with Normal Saline Cleanse wound with antibacterial soap and water 2. Anesthetic Topical Lidocaine 4% cream to wound bed prior to debridement 4. Dressing Applied: Prisma Ag 5. Secondary Dressing Applied Dry Gauze 7. Secured with Tape 4 Layer Compression System - Bilateral Electronic Signature(s) Signed: 03/12/2016 4:35:41 PM By: [name], [name] Entered By: Montey Hora on 03/12/2016 14:25:40 [name] -------------------------------------------------------------------------------- Wound Assessment Details Patient Name: [name] Date of Service: 03/12/2016 1:30 PM Medical Record Patient Account Number: [id number] Number: Treating RN: Montey Hora 1950/08/09 (65 y.o. Other Clinician: Date of  Birth/Sex: Female) Treating ROBSON, [place] Primary Care Physician: LADA, MELINDA Physician/Extender: G Referring Physician: LADA, MELINDA Weeks in Treatment: 16 Wound Status Wound Number: 7 Primary Venous Leg Ulcer Etiology: Wound Location: Left Lower Leg - Medial Wound Open Wounding Event: Gradually Appeared Status: Date Acquired: 02/06/2016 Comorbid Cataracts, Chronic Obstructive Weeks Of Treatment: 5 History: Pulmonary Disease (COPD), Sleep Clustered Wound: No Apnea, Hypertension, Osteoarthritis, Neuropathy Photos Wound Measurements Length: (cm) 0.3 % Reduction in Are Width: (cm) 0.7 % Reduction in Vol Depth: (cm) 0.1 Epithelialization: Area: (cm) 0.165 Tunneling: Volume: (cm) 0.016 Undermining: a: 90% ume: 90.3% None No No Wound Description Classification: Partial Thickness Foul Odor After Cl Wound Margin: Flat and Intact Exudate Amount: Large Exudate Type: Serous Exudate Color: amber eansing: No Wound Bed Granulation Amount: Large (67-100%) Exposed Structure Granulation Quality: Pink Fascia Exposed: No Inge, Lindsie [name]. ([id number]) Necrotic Amount: None Present (0%) Fat Layer Exposed: No Tendon Exposed: No Muscle Exposed: No Joint Exposed: No Bone Exposed: No Limited to Skin Breakdown Periwound Skin Texture Texture Color No Abnormalities Noted: No No Abnormalities Noted: No Erythema: Yes Moisture Erythema Location: Circumferential No Abnormalities Noted: No Moist: Yes Temperature / Pain Temperature: No Abnormality Tenderness on Palpation: Yes Wound Preparation Ulcer Cleansing: Other: soap and water, Topical Anesthetic Applied: Other: lidocaine 4%, Treatment Notes Wound #7 (Left, Medial Lower Leg) 1. Cleansed with: Clean wound with Normal Saline Cleanse wound with antibacterial soap and water 2. Anesthetic Topical Lidocaine 4% cream to wound bed prior to debridement 4. Dressing Applied: Prisma Ag 5. Secondary Dressing Applied Dry  Gauze 7. Secured with  Tape 4 Layer Compression System - Bilateral Electronic Signature(s) Signed: 03/12/2016 4:35:41 PM By: Montey Hora Entered By: Montey Hora on 03/12/2016 14:26:14 [name] -------------------------------------------------------------------------------- Vitals Details Patient Name: [name] Date of Service: 03/12/2016 1:30 PM Medical Record Patient Account Number: [id number] Number: Treating RN: Montey Hora 10-10-1950 (65 y.o. Other Clinician: Date of Birth/Sex: Female) Treating ROBSON, [place] Primary Care Physician: LADA, MELINDA Physician/Extender: G Referring Physician: LADA, MELINDA Weeks in Treatment: 16 Vital Signs Time Taken: 13:54 Temperature (F): 98.4 Height (in): 63 Pulse (bpm): 72 Weight (lbs): 270 Respiratory Rate (breaths/min): 20 Body Mass Index (BMI): 47.8 Blood Pressure (mmHg): 95/62 Reference Range: 80 - 120 mg / dl Electronic Signature(s) Signed: 03/12/2016 4:35:41 PM By: Montey Hora Entered By: Montey Hora on 03/12/2016 13:55:39

## 2016-03-15 ENCOUNTER — Telehealth: Payer: Self-pay | Admitting: Family Medicine

## 2016-03-15 DIAGNOSIS — I10 Essential (primary) hypertension: Secondary | ICD-10-CM | POA: Diagnosis not present

## 2016-03-15 DIAGNOSIS — I70234 Atherosclerosis of native arteries of right leg with ulceration of heel and midfoot: Secondary | ICD-10-CM | POA: Diagnosis not present

## 2016-03-15 DIAGNOSIS — L97219 Non-pressure chronic ulcer of right calf with unspecified severity: Secondary | ICD-10-CM | POA: Diagnosis not present

## 2016-03-15 DIAGNOSIS — L97419 Non-pressure chronic ulcer of right heel and midfoot with unspecified severity: Secondary | ICD-10-CM | POA: Diagnosis not present

## 2016-03-15 DIAGNOSIS — I70249 Atherosclerosis of native arteries of left leg with ulceration of unspecified site: Secondary | ICD-10-CM | POA: Diagnosis not present

## 2016-03-15 DIAGNOSIS — J449 Chronic obstructive pulmonary disease, unspecified: Secondary | ICD-10-CM | POA: Diagnosis not present

## 2016-03-15 DIAGNOSIS — Z792 Long term (current) use of antibiotics: Secondary | ICD-10-CM | POA: Diagnosis not present

## 2016-03-15 DIAGNOSIS — G35 Multiple sclerosis: Secondary | ICD-10-CM | POA: Diagnosis not present

## 2016-03-15 DIAGNOSIS — L03116 Cellulitis of left lower limb: Secondary | ICD-10-CM | POA: Diagnosis not present

## 2016-03-15 DIAGNOSIS — R339 Retention of urine, unspecified: Secondary | ICD-10-CM | POA: Diagnosis not present

## 2016-03-15 DIAGNOSIS — L97929 Non-pressure chronic ulcer of unspecified part of left lower leg with unspecified severity: Secondary | ICD-10-CM | POA: Diagnosis not present

## 2016-03-15 DIAGNOSIS — I70232 Atherosclerosis of native arteries of right leg with ulceration of calf: Secondary | ICD-10-CM | POA: Diagnosis not present

## 2016-03-15 NOTE — Telephone Encounter (Signed)
That will be fine; please thank them for what they do

## 2016-03-15 NOTE — Telephone Encounter (Signed)
[name] from [facility] is requesting another order. Asking for extension for aide to continue to come out to help with giving bath.  [phone]

## 2016-03-18 DIAGNOSIS — I10 Essential (primary) hypertension: Secondary | ICD-10-CM | POA: Diagnosis not present

## 2016-03-18 DIAGNOSIS — I70249 Atherosclerosis of native arteries of left leg with ulceration of unspecified site: Secondary | ICD-10-CM | POA: Diagnosis not present

## 2016-03-18 DIAGNOSIS — L97929 Non-pressure chronic ulcer of unspecified part of left lower leg with unspecified severity: Secondary | ICD-10-CM | POA: Diagnosis not present

## 2016-03-18 DIAGNOSIS — L97219 Non-pressure chronic ulcer of right calf with unspecified severity: Secondary | ICD-10-CM | POA: Diagnosis not present

## 2016-03-18 DIAGNOSIS — R339 Retention of urine, unspecified: Secondary | ICD-10-CM | POA: Diagnosis not present

## 2016-03-18 DIAGNOSIS — L03116 Cellulitis of left lower limb: Secondary | ICD-10-CM | POA: Diagnosis not present

## 2016-03-18 DIAGNOSIS — I70234 Atherosclerosis of native arteries of right leg with ulceration of heel and midfoot: Secondary | ICD-10-CM | POA: Diagnosis not present

## 2016-03-18 DIAGNOSIS — L97419 Non-pressure chronic ulcer of right heel and midfoot with unspecified severity: Secondary | ICD-10-CM | POA: Diagnosis not present

## 2016-03-18 DIAGNOSIS — I70232 Atherosclerosis of native arteries of right leg with ulceration of calf: Secondary | ICD-10-CM | POA: Diagnosis not present

## 2016-03-18 DIAGNOSIS — J449 Chronic obstructive pulmonary disease, unspecified: Secondary | ICD-10-CM | POA: Diagnosis not present

## 2016-03-18 DIAGNOSIS — G35 Multiple sclerosis: Secondary | ICD-10-CM | POA: Diagnosis not present

## 2016-03-18 DIAGNOSIS — Z792 Long term (current) use of antibiotics: Secondary | ICD-10-CM | POA: Diagnosis not present

## 2016-03-18 NOTE — Telephone Encounter (Signed)
Mardene Celeste from [facility] called back about the order. I gave her the verbal based off the message Dr Sanda Klein left.

## 2016-03-19 ENCOUNTER — Encounter: Payer: Medicare Other | Admitting: Nurse Practitioner

## 2016-03-19 DIAGNOSIS — Z79899 Other long term (current) drug therapy: Secondary | ICD-10-CM | POA: Diagnosis not present

## 2016-03-19 DIAGNOSIS — I70234 Atherosclerosis of native arteries of right leg with ulceration of heel and midfoot: Secondary | ICD-10-CM | POA: Diagnosis not present

## 2016-03-19 DIAGNOSIS — L97222 Non-pressure chronic ulcer of left calf with fat layer exposed: Secondary | ICD-10-CM | POA: Diagnosis not present

## 2016-03-19 DIAGNOSIS — G35 Multiple sclerosis: Secondary | ICD-10-CM | POA: Diagnosis not present

## 2016-03-19 DIAGNOSIS — L03116 Cellulitis of left lower limb: Secondary | ICD-10-CM | POA: Diagnosis not present

## 2016-03-19 DIAGNOSIS — G4733 Obstructive sleep apnea (adult) (pediatric): Secondary | ICD-10-CM | POA: Diagnosis not present

## 2016-03-19 DIAGNOSIS — L97212 Non-pressure chronic ulcer of right calf with fat layer exposed: Secondary | ICD-10-CM | POA: Diagnosis not present

## 2016-03-19 DIAGNOSIS — I70249 Atherosclerosis of native arteries of left leg with ulceration of unspecified site: Secondary | ICD-10-CM | POA: Diagnosis not present

## 2016-03-19 DIAGNOSIS — L97512 Non-pressure chronic ulcer of other part of right foot with fat layer exposed: Secondary | ICD-10-CM | POA: Diagnosis not present

## 2016-03-19 DIAGNOSIS — J449 Chronic obstructive pulmonary disease, unspecified: Secondary | ICD-10-CM | POA: Diagnosis not present

## 2016-03-19 DIAGNOSIS — R339 Retention of urine, unspecified: Secondary | ICD-10-CM | POA: Diagnosis not present

## 2016-03-19 DIAGNOSIS — L97219 Non-pressure chronic ulcer of right calf with unspecified severity: Secondary | ICD-10-CM | POA: Diagnosis not present

## 2016-03-19 DIAGNOSIS — I70232 Atherosclerosis of native arteries of right leg with ulceration of calf: Secondary | ICD-10-CM | POA: Diagnosis not present

## 2016-03-19 DIAGNOSIS — I10 Essential (primary) hypertension: Secondary | ICD-10-CM | POA: Diagnosis not present

## 2016-03-19 DIAGNOSIS — Z792 Long term (current) use of antibiotics: Secondary | ICD-10-CM | POA: Diagnosis not present

## 2016-03-19 DIAGNOSIS — L97419 Non-pressure chronic ulcer of right heel and midfoot with unspecified severity: Secondary | ICD-10-CM | POA: Diagnosis not present

## 2016-03-19 DIAGNOSIS — I739 Peripheral vascular disease, unspecified: Secondary | ICD-10-CM | POA: Diagnosis not present

## 2016-03-19 DIAGNOSIS — L97929 Non-pressure chronic ulcer of unspecified part of left lower leg with unspecified severity: Secondary | ICD-10-CM | POA: Diagnosis not present

## 2016-03-20 NOTE — Progress Notes (Signed)
[name], [name] ([id number]) Visit Report for 03/19/2016 Arrival Information Details Patient Name: [name], [name] Date of Service: 03/19/2016 2:30 PM Medical Record Number: [id number] Patient Account Number: [id number] Date of Birth/Sex: 27-Nov-1950 (65 y.o. Female) Treating RN: Montey Hora Primary Care Physician: LADA, [place] Other Clinician: Referring Physician: LADA, [place] Treating Physician/Extender: Cathie Olden in Treatment: 2 Visit Information History Since Last Visit Added or deleted any medications: No Patient Arrived: Wheel Chair Any new allergies or adverse reactions: No Arrival Time: 14:18 Had a fall or experienced change in No activities of daily living that may affect Accompanied By: self risk of falls: Transfer Assistance: None Signs or symptoms of abuse/neglect since last No Patient Identification Verified: Yes visito Secondary Verification Process Yes Hospitalized since last visit: No Completed: Pain Present Now: No Patient Requires Transmission-Based No Precautions: Patient Has Alerts: Yes Electronic Signature(s) Signed: 03/19/2016 4:46:52 PM By: Montey Hora Entered By: Montey Hora on 03/19/2016 14:24:55 [name] ([id number]) -------------------------------------------------------------------------------- Encounter Discharge Information Details Patient Name: [name] Date of Service: 03/19/2016 2:30 PM Medical Record Number: [id number] Patient Account Number: [id number] Date of Birth/Sex: [birth year] (65 y.o. Female) Treating RN: Montey Hora Primary Care Physician: LADA, [place] Other Clinician: Referring Physician: LADA, [place] Treating Physician/Extender: Cathie Olden in Treatment: 17 Encounter Discharge Information Items Discharge Pain Level: 0 Discharge Condition: Stable Ambulatory Status: Wheelchair Discharge Destination: Home Transportation: Private Auto Accompanied By: self Schedule Follow-up  Appointment: Yes Medication Reconciliation completed and provided to Patient/Care No Burgundy Matuszak: Provided on Clinical Summary of Care: 03/19/2016 Form Type Recipient Paper Patient BW Electronic Signature(s) Signed: 03/19/2016 3:48:00 PM By: Montey Hora Previous Signature: 03/19/2016 3:19:32 PM Version By: Ruthine Dose Entered By: Montey Hora on 03/19/2016 15:47:59 Ovando, [name] ([id number]) -------------------------------------------------------------------------------- Multi Wound Chart Details Patient Name: [name] Date of Service: 03/19/2016 2:30 PM Medical Record Number: [id number] Patient Account Number: [id number] Date of Birth/Sex: [birth year] (65 y.o. Female) Treating RN: Montey Hora Primary Care Physician: LADA, [place] Other Clinician: Referring Physician: LADA, MELINDA Treating Physician/Extender: Cathie Olden in Treatment: 17 Vital Signs Height(in): 63 Pulse(bpm): 74 Weight(lbs): 270 Blood Pressure 135/78 (mmHg): Body Mass Index(BMI): 48 Temperature(F): 98.2 Respiratory Rate 20 (breaths/min): Photos: [3:No Photos] [4:No Photos] [5:No Photos] Wound Location: [3:Right Lower Leg] [4:Left Lower Leg - Lateral, Left Lower Leg - Lateral, Proximal] [5:Distal] Wounding Event: [3:Trauma] [4:Trauma] [5:Gradually Appeared] Primary Etiology: [3:Trauma, Other] [4:Venous Leg Ulcer] [5:Venous Leg Ulcer] Comorbid History: [3:Cataracts, Chronic Obstructive Pulmonary Disease (COPD), Sleep Disease (COPD), Sleep Disease (COPD), Sleep Apnea, Hypertension, Osteoarthritis, Neuropathy Osteoarthritis, Neuropathy Osteoarthritis, Neuropathy] [4:Cataracts, Chronic  Obstructive Pulmonary Apnea, Hypertension,] [5:Cataracts, Chronic Obstructive Pulmonary Apnea, Hypertension,] Date Acquired: [3:10/09/2015] [4:10/09/2015] [5:12/21/2015] Weeks of Treatment: [3:17] [4:17] [5:12] Wound Status: [3:Open] [4:Open] [5:Open] Clustered Wound: [3:No] [4:Yes] [5:No] Measurements L x  W x D 1.2x0.8x0.1 [4:0.3x0.3x0.1] [5:1.9x1.2x0.1] (cm) Area (cm) : [3:0.754] [4:0.071] [5:1.791] Volume (cm) : [3:0.075] [4:0.007] [5:0.179] % Reduction in Area: [3:-52.30%] [4:24.50%] [5:-225.60%] % Reduction in Volume: 49.30% [4:22.20%] [5:-225.50%] Classification: [3:Partial Thickness] [4:Partial Thickness] [5:Partial Thickness] Exudate Amount: [3:Large] [4:Large] [5:Large] Exudate Type: [3:Serous] [4:Serous] [5:Serous] Exudate Color: [3:amber] [4:amber] [5:amber] Wound Margin: [3:Flat and Intact] [4:Flat and Intact] [5:Flat and Intact] Granulation Amount: [3:Large (67-100%)] [4:Large (67-100%)] [5:Large (67-100%)] Granulation Quality: [3:Pink] [4:Pale] [5:Pink] Necrotic Amount: [3:Small (1-33%)] [4:None Present (0%)] [5:Small (1-33%)] Exposed Structures: [3:Fascia: No Fat: No] [4:Fascia: No Fat: No] [5:Fascia: No Fat: No] Tendon: No Tendon: No Tendon: No Muscle: No Muscle: No Muscle: No Joint: No Joint: No Joint: No Bone: No Bone: No  Bone: No Limited to Skin Limited to Skin Limited to Skin Breakdown Breakdown Breakdown Epithelialization: None None None Periwound Skin Texture: Edema: No Excoriation: Yes Edema: No Excoriation: No Edema: No Excoriation: No Induration: No Induration: No Induration: No Callus: No Callus: No Callus: No Crepitus: No Crepitus: No Crepitus: No Fluctuance: No Fluctuance: No Fluctuance: No Friable: No Friable: No Friable: No Rash: No Rash: No Rash: No Scarring: No Scarring: No Scarring: No Periwound Skin Maceration: No Moist: Yes Moist: Yes Moisture: Moist: No Maceration: No Maceration: No Dry/Scaly: No Dry/Scaly: No Dry/Scaly: No Periwound Skin Color: Erythema: Yes Erythema: Yes Erythema: Yes Atrophie Blanche: No Atrophie Blanche: No Atrophie Blanche: No Cyanosis: No Cyanosis: No Cyanosis: No Ecchymosis: No Ecchymosis: No Ecchymosis: No Hemosiderin Staining: No Hemosiderin Staining: No Hemosiderin Staining:  No Mottled: No Mottled: No Mottled: No Pallor: No Pallor: No Pallor: No Rubor: No Rubor: No Rubor: No Erythema Location: Circumferential Circumferential Circumferential Temperature: No Abnormality No Abnormality No Abnormality Tenderness on Yes Yes Yes Palpation: Wound Preparation: Ulcer Cleansing: Other: Ulcer Cleansing: Other: Ulcer Cleansing: Other: soap and water soap and water soap and water Topical Anesthetic Topical Anesthetic Topical Anesthetic Applied: Other: lidocaine Applied: Other: lidocaine Applied: Other: lidocaine 4% 4% 4% Wound Number: 7 N/A N/A Photos: No Photos N/A N/A Wound Location: Left Lower Leg - Medial N/A N/A Wounding Event: Gradually Appeared N/A N/A Primary Etiology: Venous Leg Ulcer N/A N/A Comorbid History: Cataracts, Chronic N/A N/A Obstructive Pulmonary Disease (COPD), Sleep Apnea, Hypertension, Osteoarthritis, Neuropathy Date Acquired: 02/06/2016 N/A N/A Weeks of Treatment: 6 N/A N/A KENSLEI, REAP ([id number]) Wound Status: Open N/A N/A Clustered Wound: No N/A N/A Measurements L x W x D 0.2x0.2x0.1 N/A N/A (cm) Area (cm) : 0.031 N/A N/A Volume (cm) : 0.003 N/A N/A % Reduction in Area: 98.10% N/A N/A % Reduction in Volume: 98.20% N/A N/A Classification: Partial Thickness N/A N/A Exudate Amount: Large N/A N/A Exudate Type: Serous N/A N/A Exudate Color: amber N/A N/A Wound Margin: Flat and Intact N/A N/A Granulation Amount: Large (67-100%) N/A N/A Granulation Quality: Pink N/A N/A Necrotic Amount: None Present (0%) N/A N/A Exposed Structures: Fascia: No N/A N/A Fat: No Tendon: No Muscle: No Joint: No Bone: No Limited to Skin Breakdown Epithelialization: Medium (34-66%) N/A N/A Periwound Skin Texture: No Abnormalities Noted N/A N/A Periwound Skin Moist: Yes N/A N/A Moisture: Periwound Skin Color: Erythema: Yes N/A N/A Erythema Location: Circumferential N/A N/A Temperature: No Abnormality N/A N/A Tenderness on Yes N/A  N/A Palpation: Wound Preparation: Ulcer Cleansing: Other: N/A N/A soap and water Topical Anesthetic Applied: Other: lidocaine 4% Treatment Notes Electronic Signature(s) Signed: 03/19/2016 2:43:10 PM By: Montey Hora Entered By: Montey Hora on 03/19/2016 14:43:10 [name] ([id number]) -------------------------------------------------------------------------------- Multi-Disciplinary Care Plan Details Patient Name: [name] Date of Service: 03/19/2016 2:30 PM Medical Record Number: [id number] Patient Account Number: [id number] Date of Birth/Sex: November 08, 1950 (65 y.o. Female) Treating RN: Montey Hora Primary Care Physician: LADA, [place] Other Clinician: Referring Physician: LADA, MELINDA Treating Physician/Extender: Cathie Olden in Treatment: 19 Active Inactive Abuse / Safety / Falls / Self Care Management Nursing Diagnoses: Impaired physical mobility Potential for falls Goals: Patient will remain injury free Date Initiated: 11/20/2015 Goal Status: Active Interventions: Assess fall risk on admission and as needed Notes: Nutrition Nursing Diagnoses: Imbalanced nutrition Goals: Patient/caregiver agrees to and verbalizes understanding of need to use nutritional supplements and/or vitamins as prescribed Date Initiated: 11/20/2015 Goal Status: Active Interventions: Provide education on nutrition Notes: Orientation to the Wound Care Program  Nursing Diagnoses: Knowledge deficit related to the wound healing center program Goals: ROMAISA, CHARO ([id number]) Patient/caregiver will verbalize understanding of the [place] Program Date Initiated: 11/20/2015 Goal Status: Active Interventions: Provide education on orientation to the wound center Notes: Wound/Skin Impairment Nursing Diagnoses: Impaired tissue integrity Goals: Patient will demonstrate a reduced rate of smoking or cessation of smoking Date Initiated: 11/20/2015 Goal Status:  Active Ulcer/skin breakdown will heal within 14 weeks Date Initiated: 11/20/2015 Goal Status: Active Interventions: Assess patient/caregiver ability to obtain necessary supplies Provide education on smoking Notes: Electronic Signature(s) Signed: 03/19/2016 2:42:59 PM By: Montey Hora Entered By: Montey Hora on 03/19/2016 14:42:58 [place], [name] ([id number]) -------------------------------------------------------------------------------- Pain Assessment Details Patient Name: [name] Date of Service: 03/19/2016 2:30 PM Medical Record Number: [id number] Patient Account Number: [id number] Date of Birth/Sex: 02-22-1951 (65 y.o. Female) Treating RN: Montey Hora Primary Care Physician: LADA, [place] Other Clinician: Referring Physician: LADA, MELINDA Treating Physician/Extender: Cathie Olden in Treatment: 17 Active Problems Location of Pain Severity and Description of Pain Patient Has Paino Yes Site Locations Pain Location: Pain in Ulcers With Dressing Change: Yes Duration of the Pain. Constant / Intermittento Intermittent Pain Management and Medication Current Pain Management: Notes Topical or injectable lidocaine is offered to patient for acute pain when surgical debridement is performed. If needed, Patient is instructed to use over the counter pain medication for the following 24-48 hours after debridement. Wound care MDs do not prescribed pain medications. Patient has chronic pain or uncontrolled pain. Patient has been instructed to make an appointment with their Primary Care Physician for pain management. Electronic Signature(s) Signed: 03/19/2016 4:46:52 PM By: Montey Hora Entered By: Montey Hora on 03/19/2016 14:25:08 [name] ([id number]) -------------------------------------------------------------------------------- Patient/Caregiver Education Details Patient Name: [name] Date of Service: 03/19/2016 2:30 PM Medical Record  Number: [id number] Patient Account Number: [id number] Date of Birth/Gender: 1950-07-22 (65 y.o. Female) Treating RN: Montey Hora Primary Care Physician: LADA, [place] Other Clinician: Referring Physician: LADA, [place] Treating Physician/Extender: Cathie Olden in Treatment: 17 Education Assessment Education Provided To: Patient Education Topics Provided Nutrition: Handouts: Other: nutrition and wound healing Methods: Explain/Verbal Responses: State content correctly Electronic Signature(s) Signed: 03/19/2016 4:46:52 PM By: Montey Hora Entered By: Montey Hora on 03/19/2016 15:48:19 Waas, [name] ([id number]) -------------------------------------------------------------------------------- Wound Assessment Details Patient Name: [name] Date of Service: 03/19/2016 2:30 PM Medical Record Number: [id number] Patient Account Number: [id number] Date of Birth/Sex: [birth year] (65 y.o. Female) Treating RN: Montey Hora Primary Care Physician: LADA, [place] Other Clinician: Referring Physician: LADA, MELINDA Treating Physician/Extender: Cathie Olden in Treatment: 17 Wound Status Wound Number: 3 Primary Trauma, Other Etiology: Wound Location: Right Lower Leg Wound Open Wounding Event: Trauma Status: Date Acquired: 10/09/2015 Comorbid Cataracts, Chronic Obstructive Weeks Of Treatment: 17 History: Pulmonary Disease (COPD), Sleep Clustered Wound: No Apnea, Hypertension, Osteoarthritis, Neuropathy Photos Wound Measurements Length: (cm) 1.2 Width: (cm) 0.8 Depth: (cm) 0.1 Area: (cm) 0.754 Volume: (cm) 0.075 % Reduction in Area: -52.3% % Reduction in Volume: 49.3% Epithelialization: None Tunneling: No Undermining: No Wound Description Classification: Partial Thickness Wound Margin: Flat and Intact Exudate Amount: Large Exudate Type: Serous Exudate Color: amber Wound Bed Granulation Amount: Large (67-100%) Exposed Structure Granulation  Quality: Pink Fascia Exposed: No Necrotic Amount: Small (1-33%) Fat Layer Exposed: No Necrotic Quality: Adherent Slough Tendon Exposed: No Morain, Lynsay H. ([id number]) Muscle Exposed: No Joint Exposed: No Bone Exposed: No Limited to Skin Breakdown Periwound Skin Texture Texture Color No Abnormalities Noted: No No Abnormalities Noted: No  Callus: No Atrophie Blanche: No Crepitus: No Cyanosis: No Excoriation: No Ecchymosis: No Fluctuance: No Erythema: Yes Friable: No Erythema Location: Circumferential Induration: No Hemosiderin Staining: No Localized Edema: No Mottled: No Rash: No Pallor: No Scarring: No Rubor: No Moisture Temperature / Pain No Abnormalities Noted: No Temperature: No Abnormality Dry / Scaly: No Tenderness on Palpation: Yes Maceration: No Moist: No Wound Preparation Ulcer Cleansing: Other: soap and water, Topical Anesthetic Applied: Other: lidocaine 4%, Treatment Notes Wound #3 (Right Lower Leg) 1. Cleansed with: Cleanse wound with antibacterial soap and water 4. Dressing Applied: Prisma Ag 5. Secondary Dressing Applied ABD Pad 7. Secured with 4 Layer Compression System - Bilateral Electronic Signature(s) Signed: 03/19/2016 4:46:52 PM By: Montey Hora Entered By: Montey Hora on 03/19/2016 15:54:34 [place], [name] ([id number]) -------------------------------------------------------------------------------- Wound Assessment Details Patient Name: [name] Date of Service: 03/19/2016 2:30 PM Medical Record Number: [id number] Patient Account Number: [id number] Date of Birth/Sex: 06-May-1950 (65 y.o. Female) Treating RN: Montey Hora Primary Care Physician: LADA, [place] Other Clinician: Referring Physician: LADA, MELINDA Treating Physician/Extender: Cathie Olden in Treatment: 17 Wound Status Wound Number: 4 Primary Venous Leg Ulcer Etiology: Wound Location: Left Lower Leg - Lateral, Proximal Wound  Open Status: Wounding Event: Trauma Comorbid Cataracts, Chronic Obstructive Date Acquired: 10/09/2015 History: Pulmonary Disease (COPD), Sleep Weeks Of Treatment: 17 Apnea, Hypertension, Osteoarthritis, Clustered Wound: Yes Neuropathy Photos Wound Measurements Length: (cm) 0.3 Width: (cm) 0.3 Depth: (cm) 0.1 Area: (cm) 0.071 Volume: (cm) 0.007 % Reduction in Area: 24.5% % Reduction in Volume: 22.2% Epithelialization: None Tunneling: No Undermining: No Wound Description Classification: Partial Thickness Wound Margin: Flat and Intact Exudate Amount: Large Exudate Type: Serous Exudate Color: amber Wound Bed Granulation Amount: Large (67-100%) Exposed Structure Granulation Quality: Pale Fascia Exposed: No Necrotic Amount: None Present (0%) Fat Layer Exposed: No Tendon Exposed: No Critcher, Wynn H. ([id number]) Muscle Exposed: No Joint Exposed: No Bone Exposed: No Limited to Skin Breakdown Periwound Skin Texture Texture Color No Abnormalities Noted: No No Abnormalities Noted: No Callus: No Atrophie Blanche: No Crepitus: No Cyanosis: No Excoriation: Yes Ecchymosis: No Fluctuance: No Erythema: Yes Friable: No Erythema Location: Circumferential Induration: No Hemosiderin Staining: No Localized Edema: No Mottled: No Rash: No Pallor: No Scarring: No Rubor: No Moisture Temperature / Pain No Abnormalities Noted: No Temperature: No Abnormality Dry / Scaly: No Tenderness on Palpation: Yes Maceration: No Moist: Yes Wound Preparation Ulcer Cleansing: Other: soap and water, Topical Anesthetic Applied: Other: lidocaine 4%, Treatment Notes Wound #4 (Left, Proximal, Lateral Lower Leg) 1. Cleansed with: Cleanse wound with antibacterial soap and water 4. Dressing Applied: Prisma Ag 5. Secondary Dressing Applied ABD Pad 7. Secured with 4 Layer Compression System - Bilateral Electronic Signature(s) Signed: 03/19/2016 4:46:52 PM By: Montey Hora Entered  By: Montey Hora on 03/19/2016 15:55:22 [place], [name] ([id number]) -------------------------------------------------------------------------------- Wound Assessment Details Patient Name: [name] Date of Service: 03/19/2016 2:30 PM Medical Record Number: [id number] Patient Account Number: [id number] Date of Birth/Sex: [birth year] (65 y.o. Female) Treating RN: Montey Hora Primary Care Physician: LADA, [place] Other Clinician: Referring Physician: LADA, MELINDA Treating Physician/Extender: Cathie Olden in Treatment: 17 Wound Status Wound Number: 5 Primary Venous Leg Ulcer Etiology: Wound Location: Left Lower Leg - Lateral, Distal Wound Open Wounding Event: Gradually Appeared Status: Date Acquired: 12/21/2015 Comorbid Cataracts, Chronic Obstructive Weeks Of Treatment: 12 History: Pulmonary Disease (COPD), Sleep Clustered Wound: No Apnea, Hypertension, Osteoarthritis, Neuropathy Photos Wound Measurements Length: (cm) 1.9 Width: (cm) 1.2 Depth: (cm) 0.1 Area: (cm) 1.791 Volume: (cm) 0.179 %  Reduction in Area: -225.6% % Reduction in Volume: -225.5% Epithelialization: None Tunneling: No Undermining: No Wound Description Classification: Partial Thickness Wound Margin: Flat and Intact Exudate Amount: Large Exudate Type: Serous Exudate Color: amber Wound Bed Granulation Amount: Large (67-100%) Exposed Structure Granulation Quality: Pink Fascia Exposed: No Necrotic Amount: Small (1-33%) Fat Layer Exposed: No Necrotic Quality: Adherent Slough Tendon Exposed: No Lucking, Berneda H. ([id number]) Muscle Exposed: No Joint Exposed: No Bone Exposed: No Limited to Skin Breakdown Periwound Skin Texture Texture Color No Abnormalities Noted: No No Abnormalities Noted: No Callus: No Atrophie Blanche: No Crepitus: No Cyanosis: No Excoriation: No Ecchymosis: No Fluctuance: No Erythema: Yes Friable: No Erythema Location: Circumferential Induration:  No Hemosiderin Staining: No Localized Edema: No Mottled: No Rash: No Pallor: No Scarring: No Rubor: No Moisture Temperature / Pain No Abnormalities Noted: No Temperature: No Abnormality Dry / Scaly: No Tenderness on Palpation: Yes Maceration: No Moist: Yes Wound Preparation Ulcer Cleansing: Other: soap and water, Topical Anesthetic Applied: Other: lidocaine 4%, Treatment Notes Wound #5 (Left, Distal, Lateral Lower Leg) 1. Cleansed with: Cleanse wound with antibacterial soap and water 4. Dressing Applied: Prisma Ag 5. Secondary Dressing Applied ABD Pad 7. Secured with 4 Layer Compression System - Bilateral Electronic Signature(s) Signed: 03/19/2016 4:46:52 PM By: Montey Hora Entered By: Montey Hora on 03/19/2016 15:55:52 Stephani, [name] ([id number]) -------------------------------------------------------------------------------- Wound Assessment Details Patient Name: [name] Date of Service: 03/19/2016 2:30 PM Medical Record Number: [id number] Patient Account Number: [id number] Date of Birth/Sex: 1950-12-01 (65 y.o. Female) Treating RN: Montey Hora Primary Care Physician: LADA, [place] Other Clinician: Referring Physician: LADA, MELINDA Treating Physician/Extender: Cathie Olden in Treatment: 17 Wound Status Wound Number: 7 Primary Venous Leg Ulcer Etiology: Wound Location: Left Lower Leg - Medial Wound Open Wounding Event: Gradually Appeared Status: Date Acquired: 02/06/2016 Comorbid Cataracts, Chronic Obstructive Weeks Of Treatment: 6 History: Pulmonary Disease (COPD), Sleep Clustered Wound: No Apnea, Hypertension, Osteoarthritis, Neuropathy Photos Wound Measurements Length: (cm) 0.2 Width: (cm) 0.2 Depth: (cm) 0.1 Area: (cm) 0.031 Volume: (cm) 0.003 % Reduction in Area: 98.1% % Reduction in Volume: 98.2% Epithelialization: Medium (34-66%) Tunneling: No Undermining: No Wound Description Classification: Partial  Thickness Wound Margin: Flat and Intact Exudate Amount: Large Exudate Type: Serous Exudate Color: amber Foul Odor After Cleansing: No Wound Bed Granulation Amount: Large (67-100%) Exposed Structure Granulation Quality: Pink Fascia Exposed: No Necrotic Amount: None Present (0%) Fat Layer Exposed: No Tendon Exposed: No Henry, Mirissa H. ([id number]) Muscle Exposed: No Joint Exposed: No Bone Exposed: No Limited to Skin Breakdown Periwound Skin Texture Texture Color No Abnormalities Noted: No No Abnormalities Noted: No Erythema: Yes Moisture Erythema Location: Circumferential No Abnormalities Noted: No Moist: Yes Temperature / Pain Temperature: No Abnormality Tenderness on Palpation: Yes Wound Preparation Ulcer Cleansing: Other: soap and water, Topical Anesthetic Applied: Other: lidocaine 4%, Treatment Notes Wound #7 (Left, Medial Lower Leg) 1. Cleansed with: Cleanse wound with antibacterial soap and water 4. Dressing Applied: Mepitel 7. Secured with 4 Layer Compression System - Bilateral Electronic Signature(s) Signed: 03/19/2016 4:46:52 PM By: Montey Hora Entered By: Montey Hora on 03/19/2016 15:56:37 [name] ([id number]) -------------------------------------------------------------------------------- Vitals Details Patient Name: [name] Date of Service: 03/19/2016 2:30 PM Medical Record Number: [id number] Patient Account Number: [id number] Date of Birth/Sex: 04/02/1951 (65 y.o. Female) Treating RN: Montey Hora Primary Care Physician: LADA, [place] Other Clinician: Referring Physician: LADA, MELINDA Treating Physician/Extender: Cathie Olden in Treatment: 17 Vital Signs Time Taken: 14:25 Temperature (F): 98.2 Height (in): 63 Pulse (bpm): 74 Weight (  lbs): 270 Respiratory Rate (breaths/min): 20 Body Mass Index (BMI): 47.8 Blood Pressure (mmHg): 135/78 Reference Range: 80 - 120 mg / dl Electronic Signature(s) Signed: 03/19/2016  4:46:52 PM By: Montey Hora Entered By: Montey Hora on 03/19/2016 14:25:27

## 2016-03-22 DIAGNOSIS — G35 Multiple sclerosis: Secondary | ICD-10-CM | POA: Diagnosis not present

## 2016-03-22 DIAGNOSIS — I70249 Atherosclerosis of native arteries of left leg with ulceration of unspecified site: Secondary | ICD-10-CM | POA: Diagnosis not present

## 2016-03-22 DIAGNOSIS — J449 Chronic obstructive pulmonary disease, unspecified: Secondary | ICD-10-CM | POA: Diagnosis not present

## 2016-03-22 DIAGNOSIS — L97929 Non-pressure chronic ulcer of unspecified part of left lower leg with unspecified severity: Secondary | ICD-10-CM | POA: Diagnosis not present

## 2016-03-22 DIAGNOSIS — I10 Essential (primary) hypertension: Secondary | ICD-10-CM | POA: Diagnosis not present

## 2016-03-22 DIAGNOSIS — R339 Retention of urine, unspecified: Secondary | ICD-10-CM | POA: Diagnosis not present

## 2016-03-22 DIAGNOSIS — L97419 Non-pressure chronic ulcer of right heel and midfoot with unspecified severity: Secondary | ICD-10-CM | POA: Diagnosis not present

## 2016-03-22 DIAGNOSIS — I70234 Atherosclerosis of native arteries of right leg with ulceration of heel and midfoot: Secondary | ICD-10-CM | POA: Diagnosis not present

## 2016-03-22 DIAGNOSIS — L97219 Non-pressure chronic ulcer of right calf with unspecified severity: Secondary | ICD-10-CM | POA: Diagnosis not present

## 2016-03-22 DIAGNOSIS — Z792 Long term (current) use of antibiotics: Secondary | ICD-10-CM | POA: Diagnosis not present

## 2016-03-22 DIAGNOSIS — L03116 Cellulitis of left lower limb: Secondary | ICD-10-CM | POA: Diagnosis not present

## 2016-03-22 DIAGNOSIS — I70232 Atherosclerosis of native arteries of right leg with ulceration of calf: Secondary | ICD-10-CM | POA: Diagnosis not present

## 2016-03-26 ENCOUNTER — Encounter: Payer: Medicare Other | Admitting: Internal Medicine

## 2016-03-26 DIAGNOSIS — L97821 Non-pressure chronic ulcer of other part of left lower leg limited to breakdown of skin: Secondary | ICD-10-CM | POA: Diagnosis not present

## 2016-03-26 DIAGNOSIS — L97419 Non-pressure chronic ulcer of right heel and midfoot with unspecified severity: Secondary | ICD-10-CM | POA: Diagnosis not present

## 2016-03-26 DIAGNOSIS — I10 Essential (primary) hypertension: Secondary | ICD-10-CM | POA: Diagnosis not present

## 2016-03-26 DIAGNOSIS — L97811 Non-pressure chronic ulcer of other part of right lower leg limited to breakdown of skin: Secondary | ICD-10-CM | POA: Diagnosis not present

## 2016-03-26 DIAGNOSIS — L97512 Non-pressure chronic ulcer of other part of right foot with fat layer exposed: Secondary | ICD-10-CM | POA: Diagnosis not present

## 2016-03-26 DIAGNOSIS — L97219 Non-pressure chronic ulcer of right calf with unspecified severity: Secondary | ICD-10-CM | POA: Diagnosis not present

## 2016-03-26 DIAGNOSIS — L97929 Non-pressure chronic ulcer of unspecified part of left lower leg with unspecified severity: Secondary | ICD-10-CM | POA: Diagnosis not present

## 2016-03-26 DIAGNOSIS — Z792 Long term (current) use of antibiotics: Secondary | ICD-10-CM | POA: Diagnosis not present

## 2016-03-26 DIAGNOSIS — R339 Retention of urine, unspecified: Secondary | ICD-10-CM | POA: Diagnosis not present

## 2016-03-26 DIAGNOSIS — L97222 Non-pressure chronic ulcer of left calf with fat layer exposed: Secondary | ICD-10-CM | POA: Diagnosis not present

## 2016-03-26 DIAGNOSIS — J449 Chronic obstructive pulmonary disease, unspecified: Secondary | ICD-10-CM | POA: Diagnosis not present

## 2016-03-26 DIAGNOSIS — L03116 Cellulitis of left lower limb: Secondary | ICD-10-CM | POA: Diagnosis not present

## 2016-03-26 DIAGNOSIS — L97212 Non-pressure chronic ulcer of right calf with fat layer exposed: Secondary | ICD-10-CM | POA: Diagnosis not present

## 2016-03-26 DIAGNOSIS — G35 Multiple sclerosis: Secondary | ICD-10-CM | POA: Diagnosis not present

## 2016-03-26 DIAGNOSIS — I70234 Atherosclerosis of native arteries of right leg with ulceration of heel and midfoot: Secondary | ICD-10-CM | POA: Diagnosis not present

## 2016-03-26 DIAGNOSIS — I739 Peripheral vascular disease, unspecified: Secondary | ICD-10-CM | POA: Diagnosis not present

## 2016-03-26 DIAGNOSIS — Z79899 Other long term (current) drug therapy: Secondary | ICD-10-CM | POA: Diagnosis not present

## 2016-03-26 DIAGNOSIS — G4733 Obstructive sleep apnea (adult) (pediatric): Secondary | ICD-10-CM | POA: Diagnosis not present

## 2016-03-26 DIAGNOSIS — L97221 Non-pressure chronic ulcer of left calf limited to breakdown of skin: Secondary | ICD-10-CM | POA: Diagnosis not present

## 2016-03-26 DIAGNOSIS — I70249 Atherosclerosis of native arteries of left leg with ulceration of unspecified site: Secondary | ICD-10-CM | POA: Diagnosis not present

## 2016-03-26 DIAGNOSIS — I70232 Atherosclerosis of native arteries of right leg with ulceration of calf: Secondary | ICD-10-CM | POA: Diagnosis not present

## 2016-03-26 NOTE — Progress Notes (Signed)
[name], [name] ([id number]) Visit Report for 03/19/2016 Chief Complaint Document Details Patient Name: [name] Date of Service: 03/19/2016 2:30 PM Medical Record Number: [id number] Patient Account Number: [id number] Date of Birth/Sex: 25-Sep-1950 (65 y.o. Female) Treating RN: Montey Hora Primary Care Physician: LADA, [place] Other Clinician: Referring Physician: LADA, [place] Treating Physician/Extender: Cathie Olden in Treatment: 17 Information Obtained from: Patient Chief Complaint returns for evaluation of bilateral lower extremity ulcerations Electronic Signature(s) Signed: 03/19/2016 4:25:04 PM By: Rene Kocher, NP, Maurisa Tesmer Entered By: Rene Kocher, NP, Orlene Salmons on 03/19/2016 16:25:04 [name] ([id number]) -------------------------------------------------------------------------------- Debridement Details Patient Name: [name] Date of Service: 03/19/2016 2:30 PM Medical Record Number: [id number] Patient Account Number: [id number] Date of Birth/Sex: [birth year] (65 y.o. Female) Treating RN: Montey Hora Primary Care Physician: LADA, [place] Other Clinician: Referring Physician: LADA, [place] Treating Physician/Extender: Cathie Olden in Treatment: 17 Debridement Performed for Wound #3 Right Lower Leg Assessment: Performed By: Physician Lawanda Cousins, NP Debridement: Open Wound/Selective Debridement Selective Description: Pre-procedure Yes - 14:50 Verification/Time Out Taken: Start Time: 14:50 Pain Control: Lidocaine 4% Topical Solution Level: Non-Viable Tissue Total Area Debrided (L x 1.2 (cm) x 0.8 (cm) = 0.96 (cm) W): Tissue and other Non-Viable, Fibrin/Slough material debrided: Instrument: Curette Bleeding: Minimum Hemostasis Achieved: Pressure End Time: 14:51 Procedural Pain: 0 Post Procedural Pain: 0 Response to Treatment: Procedure was tolerated well Post Debridement Measurements of Total Wound Length: (cm) 1.2 Width: (cm)  0.8 Depth: (cm) 0.1 Volume: (cm) 0.075 Character of Wound/Ulcer Post Improved Debridement: Severity of Tissue Post Debridement: Fat layer exposed Post Procedure Diagnosis Same as Pre-procedure Electronic Signature(s) Signed: 03/19/2016 4:24:14 PM By: Rene Kocher, NP, Nyjae Hodge Signed: 03/19/2016 4:46:52 PM By: Baltazar Apo ([id number]) Entered By: Rene Kocher, NP, Dominica Kent on 03/19/2016 16:24:13 [name] ([id number]) -------------------------------------------------------------------------------- Debridement Details Patient Name: [name] Date of Service: 03/19/2016 2:30 PM Medical Record Number: [id number] Patient Account Number: [id number] Date of Birth/Sex: 06-24-1950 (65 y.o. Female) Treating RN: Montey Hora Primary Care Physician: LADA, [place] Other Clinician: Referring Physician: LADA, [place] Treating Physician/Extender: Cathie Olden in Treatment: 17 Debridement Performed for Wound #4 Left,Proximal,Lateral Lower Leg Assessment: Performed By: Physician Lawanda Cousins, NP Debridement: Open Wound/Selective Debridement Selective Description: Pre-procedure Yes - 14:51 Verification/Time Out Taken: Start Time: 14:51 Pain Control: Lidocaine 4% Topical Solution Level: Non-Viable Tissue Total Area Debrided (L x 0.3 (cm) x 0.3 (cm) = 0.09 (cm) W): Tissue and other Non-Viable, Fibrin/Slough material debrided: Instrument: Curette Bleeding: Minimum Hemostasis Achieved: Pressure End Time: 14:52 Procedural Pain: 0 Post Procedural Pain: 0 Response to Treatment: Procedure was tolerated well Post Debridement Measurements of Total Wound Length: (cm) 0.3 Width: (cm) 0.3 Depth: (cm) 0.1 Volume: (cm) 0.007 Character of Wound/Ulcer Post Improved Debridement: Severity of Tissue Post Debridement: Fat layer exposed Post Procedure Diagnosis Same as Pre-procedure Electronic Signature(s) Signed: 03/19/2016 4:24:25 PM By: Rene Kocher, NP, Peregrine Nolt Signed:  03/19/2016 4:46:52 PM By: Baltazar Apo ([id number]) Entered By: Rene Kocher, NP, Rishik Tubby on 03/19/2016 16:24:25 [name] ([id number]) -------------------------------------------------------------------------------- Debridement Details Patient Name: [name] Date of Service: 03/19/2016 2:30 PM Medical Record Number: [id number] Patient Account Number: [id number] Date of Birth/Sex: Dec 11, 1950 (65 y.o. Female) Treating RN: Montey Hora Primary Care Physician: LADA, [place] Other Clinician: Referring Physician: LADA, MELINDA Treating Physician/Extender: Cathie Olden in Treatment: 17 Debridement Performed for Wound #5 Left,Distal,Lateral Lower Leg Assessment: Performed By: Physician Lawanda Cousins, NP Debridement: Open Wound/Selective Debridement Selective Description: Pre-procedure Yes - 14:52 Verification/Time Out Taken: Start Time: 14:52 Pain Control:  Lidocaine 4% Topical Solution Level: Non-Viable Tissue Total Area Debrided (L x 1.9 (cm) x 1.2 (cm) = 2.28 (cm) W): Tissue and other Non-Viable, Fibrin/Slough material debrided: Instrument: Curette Bleeding: Minimum Hemostasis Achieved: Pressure End Time: 14:53 Procedural Pain: 0 Post Procedural Pain: 0 Response to Treatment: Procedure was tolerated well Post Debridement Measurements of Total Wound Length: (cm) 1.9 Width: (cm) 1.2 Depth: (cm) 0.1 Volume: (cm) 0.179 Character of Wound/Ulcer Post Improved Debridement: Severity of Tissue Post Debridement: Fat layer exposed Post Procedure Diagnosis Same as Pre-procedure Electronic Signature(s) Signed: 03/19/2016 4:24:43 PM By: Rene Kocher, NP, Dai Mcadams Signed: 03/19/2016 4:46:52 PM By: Baltazar Apo ([id number]) Entered By: Rene Kocher, NP, Rithwik Schmieg on 03/19/2016 16:24:43 [name] ([id number]) -------------------------------------------------------------------------------- HPI Details Patient Name: [name] Date of Service:  03/19/2016 2:30 PM Medical Record Number: [id number] Patient Account Number: [id number] Date of Birth/Sex: [birth year] (65 y.o. Female) Treating RN: Montey Hora Primary Care Physician: LADA, [place] Other Clinician: Referring Physician: LADA, MELINDA Treating Physician/Extender: Cathie Olden in Treatment: 17 History of Present Illness Location: left and right lower extremity and dorsum of the right foot Quality: Patient reports experiencing a sharp pain to affected area(s). Severity: Patient rates the pain to be a 8 out of 10 especially with palpation Duration: Patient has had the wound for > 6 months prior to seeking treatment at the wound center Timing: Pain in wound is Intermittent in severity but persistent Context: The wound appeared gradually over time Modifying Factors: Other treatment(s) tried include: symptomatic treatment as advised by her PCP Associated Signs and Symptoms: Patient reports having increase swelling iin her bilateral lower extremities HPI Description: 65 year old with a past medical history significant for MS, urinary incontinence, and obesity. She has been seen in the wound clinic before for lower extremity ulcerations treated with compression therapy. she is also known to have hypertension, peripheral vascular disease, COPD, obstructive sleep apnea, lumbar radiculopathy, kyphoscoliosis, urinary issues and tobacco abuse. Smokes a packet of cigarettes a day was recently seen at the [facility] for swelling of her legs and feet with a ulceration on the dorsum of the right foot which has been there for about 6 months. she was recently in the ER about a month ago where she was seen for shortness of breath and swelling of the legs and a chest x-ray was within normal limits had an increase in her BNP and was given Lasix and put on doxycycline for a mild cellulitis and possible UTI. Wounds aresmaller today 11/13/15. Debrided and will continue  Santyl. 12/21/2015 -- she was admitted to the hospital overnight on 12/18/2015 and diagnosed with urinary retention and cellulitis of the left lower leg. is past to take clindamycin and use Santyl for her wounds. 01/15/2016 -- come back to see [place] for almost a month and continues to be noncompliant with her dressings 01/30/16 patient presents today for a follow-up visit concerning her ongoing bilateral lower extremity wounds. We have not seen her for the past 2 weeks although we are supposed to be seen her for weekly visits. She currently has a Foley catheter. She tells me that the wounds are intensely painful especially with pressure and palpation at this point in time. Fortunately she is having no interval signs or symptoms of systemic infection but unfortunately the wounds have not improved dramatically since we last saw her. She does have home health coming to take care of her as well. She is currently not in any compression wraps. 02/06/16 ON evaluation today patient  continues to experience discomfort regarding her bilateral lower extremity ulcers. She has continued to tolerate the dressing changes at this point in time and continues to have a Foley catheter as well. Fortunately she is back this week in the past she has been somewhat noncompliant with follow-ups I'm glad to see her today. She tells me that her pain level varies but can be as high as a 7 out of 10 with manipulation of the wound. She tells me that she used to be on oxycodone which was managed by the pain clinic although she is no longer on that as she tells me that she was actually smoking marijuana at the time and when this was found out they discontinued her pain medication. She no [place], Kalijah H. ([id number]) longer is taking anything pain medication wise and she also does not smoke cigarettes nor marijuana at this point. 02/12/16; this is a patient I don't believe I have previously seen. She has multiple sclerosis. She has  4 punched-out areas on the anterior lateral left leg 2 areas on the right these are all in the same condition. Reasonably small [dime size] wounds each was some depth. Surface of these wounds does not look particularly healthy as there is adherent slough. There is no evidence of surrounding infection or inflammation. ABIs in this clinic were 0.87 on the right and 0.81 on the left. She is listed as having PAD and is a smoker. Not a diabetic 02/20/16 patient I gave antibiotics to last week for erythema around both wound sites on the left lateral leg and right lateral leg. This appears to be a lot better. One of the areas on the left leg is healed however she still has 3 punched-out open areas on the left lateral calf and one on the right lateral calf and one on the dorsal foot. She is an ex-smoker quitting 1 month ago. ABIs in this clinic were 0.87 on the right 0.81 on the left 03/12/16; this is a patient I really don't have a good sense of. It would appear for the first 5 or 6 weeks of her stay in this clinic she was cared for by Dr. Con Memos. She appeared on my schedule in mid October and I saw her twice. She has not been here however in 3 weeks. She has advanced Wound Care at home where she lives with her husband. She has multiple sclerosis. I saw her the first time she had 4 punched-out small wounds on the anterior lateral left leg it appears that she is now down to 2. She also had wounds on the lateral and medial aspect of her right leg which were also small and punched out. The only one that remains is on the right lateral. Because of the nature of her wound I went ahead and ordered formal arterial studies this showed an ABI of 1 on the right and one on the left. TBI of 1.4 on the right and 0.79 on the left. She had normal waveforms. She was in the emergency room on 02/28/16; there is a noted edema of her left leg after a fall. She had a duplex ultrasound that showed no evidence for an acute DVT  from the left groin to the popliteal fossa. The study was limited in the calf veins due to edema. Also noticeable that she had a left inguinal enlarged lymph node up to 6.2 cm. She also had a left knee x-ray that showed no acute findings and a right foot x-ray that  showed soft tissue swelling but no radiographic evidence of osteomyelitis. The patient does stop smoking 03-19-16 Ms Mulberry presents today for evaluation of her bilateral lower extremity ulcerations. She states that she has not smoked in several weeks. She denies any pain or discomfort to bilateral lower extremities, tolerating compression therapy as ordered. Electronic Signature(s) Signed: 03/19/2016 4:26:49 PM By: Rene Kocher, NP, Jana Hakim By: Rene Kocher, NP, Keiarra Charon on 03/19/2016 16:26:49 [name], [name] ([id number]) -------------------------------------------------------------------------------- Physical Exam Details Patient Name: [name] Date of Service: 03/19/2016 2:30 PM Medical Record Number: [id number] Patient Account Number: [id number] Date of Birth/Sex: 12-07-1950 (65 y.o. Female) Treating RN: Montey Hora Primary Care Physician: LADA, [place] Other Clinician: Referring Physician: LADA, MELINDA Treating Physician/Extender: Cathie Olden in Treatment: 17 Constitutional BP within normal limits. afebrile. well nourished; well developed; appears stated age;Marland Kitchen Respiratory non-labored respiratory effort. Cardiovascular palpable DP and PT; palpable popliteal. diffuse erythema bilaterally, warm and well-perfused with capillary refill less than 3 seconds. Integumentary (Hair, Skin) right lower extremity lateral ulceration with red tissue throughout no granulation tissue noted; left lower sugary lateral ulceration with granular buds and biofilm; small ulceration to left anterior lower extremity with small amount of biofilm; left lower extremity medial distal area with granular buds (anticipate healing within a  week). no induration, no fluctuance, and denies pain to any ulcerated areas. Psychiatric oriented to time, place, person and situation. flat affect although talkative. Electronic Signature(s) Signed: 03/19/2016 4:29:38 PM By: Rene Kocher, NP, Jana Hakim By: Rene Kocher, NP, Ayven Pheasant on 03/19/2016 16:29:38 [name], [name] ([id number]) -------------------------------------------------------------------------------- Physician Orders Details Patient Name: [name] Date of Service: 03/19/2016 2:30 PM Medical Record Number: [id number] Patient Account Number: [id number] Date of Birth/Sex: [birth year] (65 y.o. Female) Treating RN: Montey Hora Primary Care Physician: LADA, [place] Other Clinician: Referring Physician: LADA, MELINDA Treating Physician/Extender: Cathie Olden in Treatment: 27 Verbal / Phone Orders: Yes Clinician: Montey Hora Read Back and Verified: Yes Diagnosis Coding Wound Cleansing Wound #3 Right Lower Leg o Cleanse wound with mild soap and water - HHRN to wash legs with soap and water o No tub bath. Wound #4 Left,Proximal,Lateral Lower Leg o Cleanse wound with mild soap and water - HHRN to wash legs with soap and water o No tub bath. Wound #5 Left,Distal,Lateral Lower Leg o Cleanse wound with mild soap and water - HHRN to wash legs with soap and water o No tub bath. Wound #7 Left,Medial Lower Leg o Cleanse wound with mild soap and water - HHRN to wash legs with soap and water o No tub bath. Anesthetic Wound #3 Right Lower Leg o Topical Lidocaine 4% cream applied to wound bed prior to debridement - in clinic only Wound #4 Left,Proximal,Lateral Lower Leg o Topical Lidocaine 4% cream applied to wound bed prior to debridement - in clinic only Wound #5 Left,Distal,Lateral Lower Leg o Topical Lidocaine 4% cream applied to wound bed prior to debridement - in clinic only Wound #7 Left,Medial Lower Leg o Topical Lidocaine 4% cream applied to  wound bed prior to debridement - in clinic only Primary Wound Dressing Wound #3 Right Lower Leg o Prisma Ag - or collagen with silver equivalent - silver alginate is not collagen Wound #4 Left,Proximal,Lateral Lower Leg o Prisma Ag - or collagen with silver equivalent - silver alginate is not collagen Stetzel, [place] H. ([id number]) Wound #5 Left,Distal,Lateral Lower Leg o Prisma Ag - or collagen with silver equivalent - silver alginate is not collagen Wound #7 Left,Medial Lower Leg o Other: - mepitel one  Secondary Dressing Wound #3 Right Lower Leg o ABD pad o Dry Gauze Wound #4 Left,Proximal,Lateral Lower Leg o ABD pad o Dry Gauze Wound #5 Left,Distal,Lateral Lower Leg o ABD pad o Dry Gauze Wound #7 Left,Medial Lower Leg o ABD pad o Dry Gauze Dressing Change Frequency Wound #3 Right Lower Leg o Three times weekly - [facility] to change Monday and Friday Pt comes to clinic Wednesday Wound #4 Left,Proximal,Lateral Lower Leg o Three times weekly - [facility] to change Monday and Friday Pt comes to clinic Wednesday Wound #5 Left,Distal,Lateral Lower Leg o Three times weekly - [facility] to change Monday and Friday Pt comes to clinic Wednesday Wound #7 Left,Medial Lower Leg o Three times weekly - [facility] to change Monday and Friday Pt comes to clinic Wednesday Follow-up Appointments Wound #3 Right Lower Leg o Return Appointment in 1 week. Wound #4 Left,Proximal,Lateral Lower Leg o Return Appointment in 1 week. [name], [name] ([id number]) Wound #5 Left,Distal,Lateral Lower Leg o Return Appointment in 1 week. Wound #7 Left,Medial Lower Leg o Return Appointment in 1 week. Edema Control Wound #3 Right Lower Leg o 4 Layer Compression System - Bilateral - use 1 layer of unna wrap to anchor at the top o Patient to wear own compression stockings - HHRN please order compression hose for patient 20-34mmHg o Elevate legs to the level of the heart and pump ankles  as often as possible Wound #4 Left,Proximal,Lateral Lower Leg o 4 Layer Compression System - Bilateral - use 1 layer of unna wrap to anchor at the top o Patient to wear own compression stockings - HHRN please order compression hose for patient 20-38mmHg o Elevate legs to the level of the heart and pump ankles as often as possible Wound #5 Left,Distal,Lateral Lower Leg o 4 Layer Compression System - Bilateral - use 1 layer of unna wrap to anchor at the top o Patient to wear own compression stockings - HHRN please order compression hose for patient 20-58mmHg o Elevate legs to the level of the heart and pump ankles as often as possible Wound #7 Left,Medial Lower Leg o 4 Layer Compression System - Bilateral - use 1 layer of unna wrap to anchor at the top o Patient to wear own compression stockings - HHRN please order compression hose for patient 20-80mmHg o Elevate legs to the level of the heart and pump ankles as often as possible Additional Orders / Instructions Wound #3 Right Lower Leg o Stop Smoking o Increase protein intake. Wound #4 Left,Proximal,Lateral Lower Leg o Stop Smoking o Increase protein intake. Wound #5 Left,Distal,Lateral Lower Leg o Stop Smoking o Increase protein intake. Wound #7 Left,Medial Lower Leg o Stop Smoking o Increase protein intake. [name], [name] ([id number]) Home Health Wound #3 Right Lower Leg o [place] Visits - Advanced---[facility] to change wraps Monday and Friday *********[facility] please order compression hose for patient 20-58mmHg********* o Home Health Nurse may visit PRN to address patientos wound care needs. o FACE TO FACE ENCOUNTER: MEDICARE and MEDICAID PATIENTS: I certify that this patient is under my care and that I had a face-to-face encounter that meets the physician face-to-face encounter requirements with this patient on this date. The encounter with the patient was in whole or in part for the  following MEDICAL CONDITION: (primary reason for [place]) MEDICAL NECESSITY: I certify, that based on my findings, NURSING services are a medically necessary home health service. HOME BOUND STATUS: I certify that my clinical findings support that this patient is homebound (  i.e., Due to illness or injury, pt requires aid of supportive devices such as crutches, cane, wheelchairs, walkers, the use of special transportation or the assistance of another person to leave their place of residence. There is a normal inability to leave the home and doing so requires considerable and taxing effort. Other absences are for medical reasons / religious services and are infrequent or of short duration when for other reasons). o If current dressing causes regression in wound condition, may D/C ordered dressing product/s and apply Normal Saline Moist Dressing daily until next [place] / Other MD appointment. [place] of regression in wound condition at [phone]. o Please direct any NON-WOUND related issues/requests for orders to patient's Primary Care Physician Wound #4 Left,Proximal,Lateral Lower Leg o [place] Visits - Advanced---[facility] to change wraps Monday and Friday *********[facility] please order compression hose for patient 20-23mmHg********* o Home Health Nurse may visit PRN to address patientos wound care needs. o FACE TO FACE ENCOUNTER: MEDICARE and MEDICAID PATIENTS: I certify that this patient is under my care and that I had a face-to-face encounter that meets the physician face-to-face encounter requirements with this patient on this date. The encounter with the patient was in whole or in part for the following MEDICAL CONDITION: (primary reason for [place]) MEDICAL NECESSITY: I certify, that based on my findings, NURSING services are a medically necessary home health service. HOME BOUND STATUS: I certify that my clinical  findings support that this patient is homebound (i.e., Due to illness or injury, pt requires aid of supportive devices such as crutches, cane, wheelchairs, walkers, the use of special transportation or the assistance of another person to leave their place of residence. There is a normal inability to leave the home and doing so requires considerable and taxing effort. Other absences are for medical reasons / religious services and are infrequent or of short duration when for other reasons). o If current dressing causes regression in wound condition, may D/C ordered dressing product/s and apply Normal Saline Moist Dressing daily until next [place] / Other MD appointment. [place] of regression in wound condition at [phone]. o Please direct any NON-WOUND related issues/requests for orders to patient's Primary Care Physician Wound #5 Left,Distal,Lateral Lower Leg o [place] Visits - Advanced---[facility] to change wraps Monday and Friday *********[facility] please order compression hose for patient 20-21mmHg********* o Home Health Nurse may visit PRN to address patientos wound care needs. [name], [name] ([id number]) o FACE TO FACE ENCOUNTER: MEDICARE and MEDICAID PATIENTS: I certify that this patient is under my care and that I had a face-to-face encounter that meets the physician face-to-face encounter requirements with this patient on this date. The encounter with the patient was in whole or in part for the following MEDICAL CONDITION: (primary reason for [place]) MEDICAL NECESSITY: I certify, that based on my findings, NURSING services are a medically necessary home health service. HOME BOUND STATUS: I certify that my clinical findings support that this patient is homebound (i.e., Due to illness or injury, pt requires aid of supportive devices such as crutches, cane, wheelchairs, walkers, the use of special transportation or the assistance  of another person to leave their place of residence. There is a normal inability to leave the home and doing so requires considerable and taxing effort. Other absences are for medical reasons / religious services and are infrequent or of short duration when for other reasons). o If current dressing causes regression  in wound condition, may D/C ordered dressing product/s and apply Normal Saline Moist Dressing daily until next [place] / Other MD appointment. [place] of regression in wound condition at [phone]. o Please direct any NON-WOUND related issues/requests for orders to patient's Primary Care Physician Wound #7 Left,Medial Lower Leg o [place] Visits - Advanced---[facility] to change wraps Monday and Friday *********[facility] please order compression hose for patient 20-40mmHg********* o Home Health Nurse may visit PRN to address patientos wound care needs. o FACE TO FACE ENCOUNTER: MEDICARE and MEDICAID PATIENTS: I certify that this patient is under my care and that I had a face-to-face encounter that meets the physician face-to-face encounter requirements with this patient on this date. The encounter with the patient was in whole or in part for the following MEDICAL CONDITION: (primary reason for [place]) MEDICAL NECESSITY: I certify, that based on my findings, NURSING services are a medically necessary home health service. HOME BOUND STATUS: I certify that my clinical findings support that this patient is homebound (i.e., Due to illness or injury, pt requires aid of supportive devices such as crutches, cane, wheelchairs, walkers, the use of special transportation or the assistance of another person to leave their place of residence. There is a normal inability to leave the home and doing so requires considerable and taxing effort. Other absences are for medical reasons / religious services and are infrequent or of short  duration when for other reasons). o If current dressing causes regression in wound condition, may D/C ordered dressing product/s and apply Normal Saline Moist Dressing daily until next [place] / Other MD appointment. [place] of regression in wound condition at [phone]. o Please direct any NON-WOUND related issues/requests for orders to patient's Primary Care Physician Electronic Signature(s) Signed: 03/19/2016 4:46:52 PM By: Montey Hora Signed: 03/26/2016 9:25:32 AM By: Rene Kocher, NP, Alvar Malinoski Entered By: Montey Hora on 03/19/2016 14:54:29 Deupree, [name] ([id number]) -------------------------------------------------------------------------------- Problem List Details Patient Name: [name] Date of Service: 03/19/2016 2:30 PM Medical Record Number: [id number] Patient Account Number: [id number] Date of Birth/Sex: January 03, 1951 (65 y.o. 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 L97.512 Non-pressure chronic ulcer of other part of right foot with 11/20/2015 Yes fat layer exposed L97.222 Non-pressure chronic ulcer of left calf with fat layer 11/20/2015 Yes exposed G35 Multiple sclerosis 11/20/2015 Yes E66.01 Morbid (severe) obesity due to excess calories 11/20/2015 Yes F17.218 Nicotine dependence, cigarettes, with other nicotine- 11/20/2015 Yes induced disorders Inactive Problems Resolved Problems Electronic Signature(s) Signed: 03/19/2016 4:24:01 PM By: Rene Kocher, NP, Glorian Mcdonell Entered By: Rene Kocher, NP, Aubree Doody on 03/19/2016 16:24:01 [name]  ([id number]) -------------------------------------------------------------------------------- Progress Note Details Patient Name: [name] Date of Service: 03/19/2016 2:30 PM Medical Record Number: [id number] Patient Account Number: [id number] Date of Birth/Sex: 1950/10/16 (66 y.o. Female) Treating RN: Montey Hora Primary Care Physician: LADA, Joes Other Clinician: Referring Physician: LADA, MELINDA Treating Physician/Extender: Cathie Olden in Treatment: 17 Subjective Chief Complaint Information obtained from Patient returns for evaluation of bilateral lower extremity ulcerations History of Present Illness (HPI) The following HPI elements were documented for the patient's wound: Location: left and right lower extremity and dorsum of the right foot Quality: Patient reports experiencing a sharp pain to affected area(s). Severity: Patient  rates the pain to be a 8 out of 10 especially with palpation Duration: Patient has had the wound for > 6 months prior to seeking treatment at the wound center Timing: Pain in wound is Intermittent in severity but persistent Context: The wound appeared gradually over time Modifying Factors: Other treatment(s) tried include: symptomatic treatment as advised by her PCP Associated Signs and Symptoms: Patient reports having increase swelling iin her bilateral lower extremities 66 year old with a past medical history significant for MS, urinary incontinence, and obesity. She has been seen in the wound clinic before for lower extremity ulcerations treated with compression therapy. she is also known to have hypertension, peripheral vascular disease, COPD, obstructive sleep apnea, lumbar radiculopathy, kyphoscoliosis, urinary issues and tobacco abuse. Smokes a packet of cigarettes a day was recently seen at the [facility] for swelling of her legs and feet with a ulceration on the dorsum of the right foot which has been there for  about 6 months. she was recently in the ER about a month ago where she was seen for shortness of breath and swelling of the legs and a chest x-ray was within normal limits had an increase in her BNP and was given Lasix and put on doxycycline for a mild cellulitis and possible UTI. Wounds aresmaller today 11/13/15. Debrided and will continue Santyl. 12/21/2015 -- she was admitted to the hospital overnight on 12/18/2015 and diagnosed with urinary retention and cellulitis of the left lower leg. is past to take clindamycin and use Santyl for her wounds. 01/15/2016 -- come back to see [place] for almost a month and continues to be noncompliant with her dressings 01/30/16 patient presents today for a follow-up visit concerning her ongoing bilateral lower extremity wounds. We have not seen her for the past 2 weeks although we are supposed to be seen her for weekly visits. She currently has a Foley catheter. She tells me that the wounds are intensely painful especially with pressure and palpation at this point in time. Fortunately she is having no interval signs or symptoms of systemic infection but unfortunately the wounds have not improved dramatically since we last saw her. She does have home health coming to take care of her as well. She is currently not in any compression wraps. 02/06/16 ON evaluation today patient continues to experience discomfort regarding her bilateral lower Bar[place], Charle H. ([id number]) extremity ulcers. She has continued to tolerate the dressing changes at this point in time and continues to have a Foley catheter as well. Fortunately she is back this week in the past she has been somewhat noncompliant with follow-ups I'm glad to see her today. She tells me that her pain level varies but can be as high as a 7 out of 10 with manipulation of the wound. She tells me that she used to be on oxycodone which was managed by the pain clinic although she is no longer on that as she tells me that  she was actually smoking marijuana at the time and when this was found out they discontinued her pain medication. She no longer is taking anything pain medication wise and she also does not smoke cigarettes nor marijuana at this point. 02/12/16; this is a patient I don't believe I have previously seen. She has multiple sclerosis. She has 4 punched-out areas on the anterior lateral left leg 2 areas on the right these are all in the same condition. Reasonably small [dime size] wounds each was some depth. Surface of these wounds does not  look particularly healthy as there is adherent slough. There is no evidence of surrounding infection or inflammation. ABIs in this clinic were 0.87 on the right and 0.81 on the left. She is listed as having PAD and is a smoker. Not a diabetic 02/20/16 patient I gave antibiotics to last week for erythema around both wound sites on the left lateral leg and right lateral leg. This appears to be a lot better. One of the areas on the left leg is healed however she still has 3 punched-out open areas on the left lateral calf and one on the right lateral calf and one on the dorsal foot. She is an ex-smoker quitting 1 month ago. ABIs in this clinic were 0.87 on the right 0.81 on the left 03/12/16; this is a patient I really don't have a good sense of. It would appear for the first 5 or 6 weeks of her stay in this clinic she was cared for by Dr. Con Memos. She appeared on my schedule in mid October and I saw her twice. She has not been here however in 3 weeks. She has advanced Wound Care at home where she lives with her husband. She has multiple sclerosis. I saw her the first time she had 4 punched-out small wounds on the anterior lateral left leg it appears that she is now down to 2. She also had wounds on the lateral and medial aspect of her right leg which were also small and punched out. The only one that remains is on the right lateral. Because of the nature of her wound I  went ahead and ordered formal arterial studies this showed an ABI of 1 on the right and one on the left. TBI of 1.4 on the right and 0.79 on the left. She had normal waveforms. She was in the emergency room on 02/28/16; there is a noted edema of her left leg after a fall. She had a duplex ultrasound that showed no evidence for an acute DVT from the left groin to the popliteal fossa. The study was limited in the calf veins due to edema. Also noticeable that she had a left inguinal enlarged lymph node up to 6.2 cm. She also had a left knee x-ray that showed no acute findings and a right foot x-ray that showed soft tissue swelling but no radiographic evidence of osteomyelitis. The patient does stop smoking 03-19-16 Ms Marcucci presents today for evaluation of her bilateral lower extremity ulcerations. She states that she has not smoked in several weeks. She denies any pain or discomfort to bilateral lower extremities, tolerating compression therapy as ordered. Objective Constitutional BP within normal limits. afebrile. well nourished; well developed; appears stated age;Marland Kitchen Vitals Time Taken: 2:25 PM, Height: 63 in, Weight: 270 lbs, BMI: 47.8, Temperature: 98.2 F, Pulse: 74 bpm, Respiratory Rate: 20 breaths/min, Blood Pressure: 135/78 mmHg. ARIONA, KULMAN ([id number]) Respiratory non-labored respiratory effort. Cardiovascular palpable DP and PT; palpable popliteal. diffuse erythema bilaterally, warm and well-perfused with capillary refill less than 3 seconds. Psychiatric oriented to time, place, person and situation. flat affect although talkative. Integumentary (Hair, Skin) right lower extremity lateral ulceration with red tissue throughout no granulation tissue noted; left lower sugary lateral ulceration with granular buds and biofilm; small ulceration to left anterior lower extremity with small amount of biofilm; left lower extremity medial distal area with granular buds (anticipate healing  within a week). no induration, no fluctuance, and denies pain to any ulcerated areas. Wound #3 status is Open. Original cause of  wound was Trauma. The wound is located on the Right Lower Leg. The wound measures 1.2cm length x 0.8cm width x 0.1cm depth; 0.754cm^2 area and 0.075cm^3 volume. The wound is limited to skin breakdown. There is no tunneling or undermining noted. There is a large amount of serous drainage noted. The wound margin is flat and intact. There is large (67-100%) pink granulation within the wound bed. There is a small (1-33%) amount of necrotic tissue within the wound bed including Adherent Slough. The periwound skin appearance exhibited: Erythema. The periwound skin appearance did not exhibit: Callus, Crepitus, Excoriation, Fluctuance, Friable, Induration, Localized Edema, Rash, Scarring, Dry/Scaly, Maceration, Moist, Atrophie Blanche, Cyanosis, Ecchymosis, Hemosiderin Staining, Mottled, Pallor, Rubor. The surrounding wound skin color is noted with erythema which is circumferential. Periwound temperature was noted as No Abnormality. The periwound has tenderness on palpation. Wound #4 status is Open. Original cause of wound was Trauma. The wound is located on the Left,Proximal,Lateral Lower Leg. The wound measures 0.3cm length x 0.3cm width x 0.1cm depth; 0.071cm^2 area and 0.007cm^3 volume. The wound is limited to skin breakdown. There is no tunneling or undermining noted. There is a large amount of serous drainage noted. The wound margin is flat and intact. There is large (67-100%) pale granulation within the wound bed. There is no necrotic tissue within the wound bed. The periwound skin appearance exhibited: Excoriation, Moist, Erythema. The periwound skin appearance did not exhibit: Callus, Crepitus, Fluctuance, Friable, Induration, Localized Edema, Rash, Scarring, Dry/Scaly, Maceration, Atrophie Blanche, Cyanosis, Ecchymosis, Hemosiderin Staining, Mottled, Pallor, Rubor.  The surrounding wound skin color is noted with erythema which is circumferential. Periwound temperature was noted as No Abnormality. The periwound has tenderness on palpation. Wound #5 status is Open. Original cause of wound was Gradually Appeared. The wound is located on the Left,Distal,Lateral Lower Leg. The wound measures 1.9cm length x 1.2cm width x 0.1cm depth; 1.791cm^2 area and 0.179cm^3 volume. The wound is limited to skin breakdown. There is no tunneling or undermining noted. There is a large amount of serous drainage noted. The wound margin is flat and intact. There is large (67-100%) pink granulation within the wound bed. There is a small (1-33%) amount of necrotic tissue within the wound bed including Adherent Slough. The periwound skin appearance exhibited: Moist, Erythema. The periwound skin appearance did not exhibit: Callus, Crepitus, Excoriation, Fluctuance, Friable, Induration, Localized Edema, Rash, Scarring, Dry/Scaly, Maceration, Atrophie Blanche, Cyanosis, Ecchymosis, Creely, Emmilynn H. ([id number]) Hemosiderin Staining, Mottled, Pallor, Rubor. The surrounding wound skin color is noted with erythema which is circumferential. Periwound temperature was noted as No Abnormality. The periwound has tenderness on palpation. Wound #7 status is Open. Original cause of wound was Gradually Appeared. The wound is located on the Left,Medial Lower Leg. The wound measures 0.2cm length x 0.2cm width x 0.1cm depth; 0.031cm^2 area and 0.003cm^3 volume. The wound is limited to skin breakdown. There is no tunneling or undermining noted. There is a large amount of serous drainage noted. The wound margin is flat and intact. There is large (67-100%) pink granulation within the wound bed. There is no necrotic tissue within the wound bed. The periwound skin appearance exhibited: Moist, Erythema. The surrounding wound skin color is noted with erythema which is circumferential. Periwound temperature was  noted as No Abnormality. The periwound has tenderness on palpation. Assessment Active Problems ICD-10 L97.212 - Non-pressure chronic ulcer of right calf with fat layer exposed L97.512 - Non-pressure chronic ulcer of other part of right foot with fat layer exposed L97.222 - Non-pressure  chronic ulcer of left calf with fat layer exposed G35 - Multiple sclerosis E66.01 - Morbid (severe) obesity due to excess calories F17.218 - Nicotine dependence, cigarettes, with other nicotine-induced disorders Procedures Wound #3 Wound #3 is a Trauma, Other located on the Right Lower Leg . There was a Non-Viable Tissue Open Wound/Selective [phone]) debridement with total area of 0.96 sq cm performed by Lawanda Cousins, NP. with the following instrument(s): Curette to remove Non-Viable tissue/material including Fibrin/Slough after achieving pain control using Lidocaine 4% Topical Solution. A time out was conducted at 14:50, prior to the start of the procedure. A Minimum amount of bleeding was controlled with Pressure. The procedure was tolerated well with a pain level of 0 throughout and a pain level of 0 following the procedure. Post Debridement Measurements: 1.2cm length x 0.8cm width x 0.1cm depth; 0.075cm^3 volume. Character of Wound/Ulcer Post Debridement is improved. Severity of Tissue Post Debridement is: Fat layer exposed. Post procedure Diagnosis Wound #3: Same as Pre-Procedure Wound #4 Wound #4 is a Venous Leg Ulcer located on the Left,Proximal,Lateral Lower Leg . There was a Non-Viable [name], [name]) Tissue Open Wound/Selective [phone]) debridement with total area of 0.09 sq cm performed by Lawanda Cousins, NP. with the following instrument(s): Curette to remove Non-Viable tissue/material including Fibrin/Slough after achieving pain control using Lidocaine 4% Topical Solution. A time out was conducted at 14:51, prior to the start of the procedure. A Minimum amount of bleeding  was controlled with Pressure. The procedure was tolerated well with a pain level of 0 throughout and a pain level of 0 following the procedure. Post Debridement Measurements: 0.3cm length x 0.3cm width x 0.1cm depth; 0.007cm^3 volume. Character of Wound/Ulcer Post Debridement is improved. Severity of Tissue Post Debridement is: Fat layer exposed. Post procedure Diagnosis Wound #4: Same as Pre-Procedure Wound #5 Wound #5 is a Venous Leg Ulcer located on the Left,Distal,Lateral Lower Leg . There was a Non-Viable Tissue Open Wound/Selective [phone]) debridement with total area of 2.28 sq cm performed by Lawanda Cousins, NP. with the following instrument(s): Curette to remove Non-Viable tissue/material including Fibrin/Slough after achieving pain control using Lidocaine 4% Topical Solution. A time out was conducted at 14:52, prior to the start of the procedure. A Minimum amount of bleeding was controlled with Pressure. The procedure was tolerated well with a pain level of 0 throughout and a pain level of 0 following the procedure. Post Debridement Measurements: 1.9cm length x 1.2cm width x 0.1cm depth; 0.179cm^3 volume. Character of Wound/Ulcer Post Debridement is improved. Severity of Tissue Post Debridement is: Fat layer exposed. Post procedure Diagnosis Wound #5: Same as Pre-Procedure Plan Wound Cleansing: Wound #3 Right Lower Leg: Cleanse wound with mild soap and water - HHRN to wash legs with soap and water No tub bath. Wound #4 Left,Proximal,Lateral Lower Leg: Cleanse wound with mild soap and water - HHRN to wash legs with soap and water No tub bath. Wound #5 Left,Distal,Lateral Lower Leg: Cleanse wound with mild soap and water - HHRN to wash legs with soap and water No tub bath. Wound #7 Left,Medial Lower Leg: Cleanse wound with mild soap and water - HHRN to wash legs with soap and water No tub bath. Anesthetic: Wound #3 Right Lower Leg: Topical Lidocaine 4% cream applied to  wound bed prior to debridement - in clinic only Wound #4 Left,Proximal,Lateral Lower Leg: Topical Lidocaine 4% cream applied to wound bed prior to debridement - in clinic only Wound #5 Left,Distal,Lateral Lower Leg: Topical Lidocaine 4% cream  applied to wound bed prior to debridement - in clinic only AZURE, BOGARIN. ([id number]) Wound #7 Left,Medial Lower Leg: Topical Lidocaine 4% cream applied to wound bed prior to debridement - in clinic only Primary Wound Dressing: Wound #3 Right Lower Leg: Prisma Ag - or collagen with silver equivalent - silver alginate is not collagen Wound #4 Left,Proximal,Lateral Lower Leg: Prisma Ag - or collagen with silver equivalent - silver alginate is not collagen Wound #5 Left,Distal,Lateral Lower Leg: Prisma Ag - or collagen with silver equivalent - silver alginate is not collagen Wound #7 Left,Medial Lower Leg: Other: - mepitel one Secondary Dressing: Wound #3 Right Lower Leg: ABD pad Dry Gauze Wound #4 Left,Proximal,Lateral Lower Leg: ABD pad Dry Gauze Wound #5 Left,Distal,Lateral Lower Leg: ABD pad Dry Gauze Wound #7 Left,Medial Lower Leg: ABD pad Dry Gauze Dressing Change Frequency: Wound #3 Right Lower Leg: Three times weekly - [facility] to change Monday and Friday Pt comes to clinic Wednesday Wound #4 Left,Proximal,Lateral Lower Leg: Three times weekly - [facility] to change Monday and Friday Pt comes to clinic Wednesday Wound #5 Left,Distal,Lateral Lower Leg: Three times weekly - [facility] to change Monday and Friday Pt comes to clinic Wednesday Wound #7 Left,Medial Lower Leg: Three times weekly - [facility] to change Monday and Friday Pt comes to clinic Wednesday Follow-up Appointments: Wound #3 Right Lower Leg: Return Appointment in 1 week. Wound #4 Left,Proximal,Lateral Lower Leg: Return Appointment in 1 week. Wound #5 Left,Distal,Lateral Lower Leg: Return Appointment in 1 week. Wound #7 Left,Medial Lower Leg: Return Appointment in 1 week. Edema  Control: Wound #3 Right Lower Leg: 4 Layer Compression System - Bilateral - use 1 layer of unna wrap to anchor at the top Patient to wear own compression stockings - HHRN please order compression hose for patient 20- 40mmHg Elevate legs to the level of the heart and pump ankles as often as possible Wound #4 Left,Proximal,Lateral Lower Leg: 4 Layer Compression System - Bilateral - use 1 layer of unna wrap to anchor at the top Patient to wear own compression stockings - HHRN please order compression hose for patient 20- Pettus, Rosi H. ([id number]) 78mmHg Elevate legs to the level of the heart and pump ankles as often as possible Wound #5 Left,Distal,Lateral Lower Leg: 4 Layer Compression System - Bilateral - use 1 layer of unna wrap to anchor at the top Patient to wear own compression stockings - HHRN please order compression hose for patient 20- 47mmHg Elevate legs to the level of the heart and pump ankles as often as possible Wound #7 Left,Medial Lower Leg: 4 Layer Compression System - Bilateral - use 1 layer of unna wrap to anchor at the top Patient to wear own compression stockings - HHRN please order compression hose for patient 20- 35mmHg Elevate legs to the level of the heart and pump ankles as often as possible Additional Orders / Instructions: Wound #3 Right Lower Leg: Stop Smoking Increase protein intake. Wound #4 Left,Proximal,Lateral Lower Leg: Stop Smoking Increase protein intake. Wound #5 Left,Distal,Lateral Lower Leg: Stop Smoking Increase protein intake. Wound #7 Left,Medial Lower Leg: Stop Smoking Increase protein intake. Home Health: Wound #3 Right Lower Leg: Continue Home Health Visits - [facility] to change wraps Monday and Friday *********[facility] please order compression hose for patient 20-38mmHg********* Home Health Nurse may visit PRN to address patient s wound care needs. FACE TO FACE ENCOUNTER: MEDICARE and MEDICAID PATIENTS: I certify that this  patient is under my care and that I had a face-to-face encounter that  meets the physician face-to-face encounter requirements with this patient on this date. The encounter with the patient was in whole or in part for the following MEDICAL CONDITION: (primary reason for [place]) MEDICAL NECESSITY: I certify, that based on my findings, NURSING services are a medically necessary home health service. HOME BOUND STATUS: I certify that my clinical findings support that this patient is homebound (i.e., Due to illness or injury, pt requires aid of supportive devices such as crutches, cane, wheelchairs, walkers, the use of special transportation or the assistance of another person to leave their place of residence. There is a normal inability to leave the home and doing so requires considerable and taxing effort. Other absences are for medical reasons / religious services and are infrequent or of short duration when for other reasons). If current dressing causes regression in wound condition, may D/C ordered dressing product/s and apply Normal Saline Moist Dressing daily until next [place] / Other MD appointment. [place] of regression in wound condition at [phone]. Please direct any NON-WOUND related issues/requests for orders to patient's Primary Care Physician Wound #4 Left,Proximal,Lateral Lower Leg: [place] Visits - [facility] to change wraps Monday and Friday *********[facility] please order compression hose for patient 20-67mmHg********* Home Health Nurse may visit PRN to address patient s wound care needs. FACE TO FACE ENCOUNTER: MEDICARE and MEDICAID PATIENTS: I certify that this patient is under my care and that I had a face-to-face encounter that meets the physician face-to-face encounter requirements with this patient on this date. The encounter with the patient was in whole or in part for the [facility]. ([id number]) following  MEDICAL CONDITION: (primary reason for [place]) MEDICAL NECESSITY: I certify, that based on my findings, NURSING services are a medically necessary home health service. HOME BOUND STATUS: I certify that my clinical findings support that this patient is homebound (i.e., Due to illness or injury, pt requires aid of supportive devices such as crutches, cane, wheelchairs, walkers, the use of special transportation or the assistance of another person to leave their place of residence. There is a normal inability to leave the home and doing so requires considerable and taxing effort. Other absences are for medical reasons / religious services and are infrequent or of short duration when for other reasons). If current dressing causes regression in wound condition, may D/C ordered dressing product/s and apply Normal Saline Moist Dressing daily until next [place] / Other MD appointment. [place] of regression in wound condition at [phone]. Please direct any NON-WOUND related issues/requests for orders to patient's Primary Care Physician Wound #5 Left,Distal,Lateral Lower Leg: [place] Visits - Advanced---[facility] to change wraps Monday and Friday *********[facility] please order compression hose for patient 20-6mmHg********* Home Health Nurse may visit PRN to address patient s wound care needs. FACE TO FACE ENCOUNTER: MEDICARE and MEDICAID PATIENTS: I certify that this patient is under my care and that I had a face-to-face encounter that meets the physician face-to-face encounter requirements with this patient on this date. The encounter with the patient was in whole or in part for the following MEDICAL CONDITION: (primary reason for [place]) MEDICAL NECESSITY: I certify, that based on my findings, NURSING services are a medically necessary home health service. HOME BOUND STATUS: I certify that my clinical findings support that this patient is  homebound (i.e., Due to illness or injury, pt requires aid of supportive devices such as crutches, cane, wheelchairs, walkers, the use  of special transportation or the assistance of another person to leave their place of residence. There is a normal inability to leave the home and doing so requires considerable and taxing effort. Other absences are for medical reasons / religious services and are infrequent or of short duration when for other reasons). If current dressing causes regression in wound condition, may D/C ordered dressing product/s and apply Normal Saline Moist Dressing daily until next [place] / Other MD appointment. [place] of regression in wound condition at [phone]. Please direct any NON-WOUND related issues/requests for orders to patient's Primary Care Physician Wound #7 Left,Medial Lower Leg: [place] Visits - [facility]---[facility] to change wraps Monday and Friday *********[facility] please order compression hose for patient 20-80mmHg********* Home Health Nurse may visit PRN to address patient s wound care needs. FACE TO FACE ENCOUNTER: MEDICARE and MEDICAID PATIENTS: I certify that this patient is under my care and that I had a face-to-face encounter that meets the physician face-to-face encounter requirements with this patient on this date. The encounter with the patient was in whole or in part for the following MEDICAL CONDITION: (primary reason for [place]) MEDICAL NECESSITY: I certify, that based on my findings, NURSING services are a medically necessary home health service. HOME BOUND STATUS: I certify that my clinical findings support that this patient is homebound (i.e., Due to illness or injury, pt requires aid of supportive devices such as crutches, cane, wheelchairs, walkers, the use of special transportation or the assistance of another person to leave their place of residence. There is a normal inability to leave the  home and doing so requires considerable and taxing effort. Other absences are for medical reasons / religious services and are infrequent or of short duration when for other reasons). If current dressing causes regression in wound condition, may D/C ordered dressing product/s and apply Normal Saline Moist Dressing daily until next [place] / Other MD appointment. [place] of regression in wound condition at [phone]. Please direct any NON-WOUND related issues/requests for orders to patient's Primary Care Physician ARTURO, GUTERMUTH ([id number]) Follow-Up Appointments: A follow-up appointment should be scheduled. A Patient Clinical Summary of Care was provided to [facility] Electronic Signature(s) Signed: 03/19/2016 4:30:08 PM By: Rene Kocher, NP, Chrisa Hassan Entered By: Rene Kocher, NP, Signora Zucco on 03/19/2016 16:30:08 [name] -------------------------------------------------------------------------------- SuperBill Details Patient Name: [name] Date of Service: 03/19/2016 Medical Record Number: [id number] Patient Account Number: [id number] Date of Birth/Sex: 1950-10-21 (65 y.o. 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 with fat layer exposed G35 Multiple sclerosis E66.01 Morbid (severe) obesity due to excess calories F17.218 Nicotine dependence, cigarettes, with other nicotine-induced disorders Facility Procedures CPT4 Code: [id number] Description: [id number] - [place] WOUND 1ST 20 SQ CM OR < ICD-10 Description Diagnosis L97.212 Non-pressure chronic ulcer of  right calf with fat l L97.222 Non-pressure chronic ulcer of left calf with fat la Modifier: ayer exposed yer exposed Quantity: 1 Physician Procedures CPT4 Code: [id number] Description: 97597 - WC PHYS DEBR WO ANESTH 20 SQ CM ICD-10 Description Diagnosis L97.212 Non-pressure chronic ulcer of right calf with fat l L97.222 Non-pressure chronic ulcer of left calf with fat  la Modifier: ayer exposed yer exposed Quantity: 1 Electronic Signature(s) Signed: 03/19/2016 4:30:29 PM By: Rene Kocher, NP, Somaya Grassi Entered By: Rene Kocher, NP, Laquincy Eastridge on 03/19/2016 16:30:29

## 2016-03-27 DIAGNOSIS — I10 Essential (primary) hypertension: Secondary | ICD-10-CM | POA: Diagnosis not present

## 2016-03-27 DIAGNOSIS — L97219 Non-pressure chronic ulcer of right calf with unspecified severity: Secondary | ICD-10-CM | POA: Diagnosis not present

## 2016-03-27 DIAGNOSIS — L97419 Non-pressure chronic ulcer of right heel and midfoot with unspecified severity: Secondary | ICD-10-CM | POA: Diagnosis not present

## 2016-03-27 DIAGNOSIS — I70232 Atherosclerosis of native arteries of right leg with ulceration of calf: Secondary | ICD-10-CM | POA: Diagnosis not present

## 2016-03-27 DIAGNOSIS — Z792 Long term (current) use of antibiotics: Secondary | ICD-10-CM | POA: Diagnosis not present

## 2016-03-27 DIAGNOSIS — I70249 Atherosclerosis of native arteries of left leg with ulceration of unspecified site: Secondary | ICD-10-CM | POA: Diagnosis not present

## 2016-03-27 DIAGNOSIS — J449 Chronic obstructive pulmonary disease, unspecified: Secondary | ICD-10-CM | POA: Diagnosis not present

## 2016-03-27 DIAGNOSIS — G35 Multiple sclerosis: Secondary | ICD-10-CM | POA: Diagnosis not present

## 2016-03-27 DIAGNOSIS — L97929 Non-pressure chronic ulcer of unspecified part of left lower leg with unspecified severity: Secondary | ICD-10-CM | POA: Diagnosis not present

## 2016-03-27 DIAGNOSIS — R339 Retention of urine, unspecified: Secondary | ICD-10-CM | POA: Diagnosis not present

## 2016-03-27 DIAGNOSIS — I70234 Atherosclerosis of native arteries of right leg with ulceration of heel and midfoot: Secondary | ICD-10-CM | POA: Diagnosis not present

## 2016-03-27 DIAGNOSIS — L03116 Cellulitis of left lower limb: Secondary | ICD-10-CM | POA: Diagnosis not present

## 2016-03-27 NOTE — Progress Notes (Signed)
ANYEA, FEIGENBAUM ([id number]) Visit Report for 03/26/2016 Chief Complaint Document Details Patient Name: SIBYLE, MOHAMMADI Date of Service: 03/26/2016 2:15 PM Medical Record Patient Account Number: [id number] Number: Treating RN: Montey Hora 11/23/1950 (65 y.o. Other Clinician: Date of Birth/Sex: Female) Treating Taim Wurm Primary Care Physician/Extender: Peri Jefferson, [place] Physician: Referring Physician: LADA, MELINDA Weeks in Treatment: 23 Information Obtained from: Patient Chief Complaint returns for evaluation of bilateral lower extremity ulcerations Electronic Signature(s) Signed: 03/26/2016 6:11:07 PM By: Linton Ham MD Entered By: Linton Ham on 03/26/2016 17:29:55 [name] ([id number]) -------------------------------------------------------------------------------- HPI Details Patient Name: [name] Date of Service: 03/26/2016 2:15 PM Medical Record Patient Account Number: [id number] Number: Treating RN: Montey Hora [birth year] (65 y.o. Other Clinician: Date of Birth/Sex: Female) Treating Kindsey Eblin Primary Care Physician/Extender: Peri Jefferson, [place] Physician: Referring Physician: LADA, MELINDA Weeks in Treatment: 18 History of Present Illness Location: left and right lower extremity and dorsum of the right foot Quality: Patient reports experiencing a sharp pain to affected area(s). Severity: Patient rates the pain to be a 8 out of 10 especially with palpation Duration: Patient has had the wound for > 6 months prior to seeking treatment at the wound center Timing: Pain in wound is Intermittent in severity but persistent Context: The wound appeared gradually over time Modifying Factors: Other treatment(s) tried include: symptomatic treatment as advised by her PCP Associated Signs and Symptoms: Patient reports having increase swelling iin her bilateral lower extremities HPI Description: 65 year old with a past medical  history significant for MS, urinary incontinence, and obesity. She has been seen in the wound clinic before for lower extremity ulcerations treated with compression therapy. she is also known to have hypertension, peripheral vascular disease, COPD, obstructive sleep apnea, lumbar radiculopathy, kyphoscoliosis, urinary issues and tobacco abuse. Smokes a packet of cigarettes a day was recently seen at the [facility] for swelling of her legs and feet with a ulceration on the dorsum of the right foot which has been there for about 6 months. she was recently in the ER about a month ago where she was seen for shortness of breath and swelling of the legs and a chest x-ray was within normal limits had an increase in her BNP and was given Lasix and put on doxycycline for a mild cellulitis and possible UTI. Wounds aresmaller today 11/13/15. Debrided and will continue Santyl. 12/21/2015 -- she was admitted to the hospital overnight on 12/18/2015 and diagnosed with urinary retention and cellulitis of the left lower leg. is past to take clindamycin and use Santyl for her wounds. 01/15/2016 -- come back to see [place] for almost a month and continues to be noncompliant with her dressings 01/30/16 patient presents today for a follow-up visit concerning her ongoing bilateral lower extremity wounds. We have not seen her for the past 2 weeks although we are supposed to be seen her for weekly visits. She currently has a Foley catheter. She tells me that the wounds are intensely painful especially with pressure and palpation at this point in time. Fortunately she is having no interval signs or symptoms of systemic infection but unfortunately the wounds have not improved dramatically since we last saw her. She does have home health coming to take care of her as well. She is currently not in any compression wraps. 02/06/16 ON evaluation today patient continues to experience discomfort regarding her bilateral  lower extremity ulcers. She has continued to tolerate the dressing changes at this point in time and continues  to have a Foley catheter as well. Fortunately she is back this week in the past she has been somewhat noncompliant with follow-ups I'm glad to see her today. She tells me that her pain level varies but can be as [place], Wilhelmine H. ([id number]) high as a 7 out of 10 with manipulation of the wound. She tells me that she used to be on oxycodone which was managed by the pain clinic although she is no longer on that as she tells me that she was actually smoking marijuana at the time and when this was found out they discontinued her pain medication. She no longer is taking anything pain medication wise and she also does not smoke cigarettes nor marijuana at this point. 02/12/16; this is a patient I don't believe I have previously seen. She has multiple sclerosis. She has 4 punched-out areas on the anterior lateral left leg 2 areas on the right these are all in the same condition. Reasonably small [dime size] wounds each was some depth. Surface of these wounds does not look particularly healthy as there is adherent slough. There is no evidence of surrounding infection or inflammation. ABIs in this clinic were 0.87 on the right and 0.81 on the left. She is listed as having PAD and is a smoker. Not a diabetic 02/20/16 patient I gave antibiotics to last week for erythema around both wound sites on the left lateral leg and right lateral leg. This appears to be a lot better. One of the areas on the left leg is healed however she still has 3 punched-out open areas on the left lateral calf and one on the right lateral calf and one on the dorsal foot. She is an ex-smoker quitting 1 month ago. ABIs in this clinic were 0.87 on the right 0.81 on the left 03/12/16; this is a patient I really don't have a good sense of. It would appear for the first 5 or 6 weeks of her stay in this clinic she was cared for by  Dr. Con Memos. She appeared on my schedule in mid October and I saw her twice. She has not been here however in 3 weeks. She has advanced Wound Care at home where she lives with her husband. She has multiple sclerosis. I saw her the first time she had 4 punched-out small wounds on the anterior lateral left leg it appears that she is now down to 2. She also had wounds on the lateral and medial aspect of her right leg which were also small and punched out. The only one that remains is on the right lateral. Because of the nature of her wound I went ahead and ordered formal arterial studies this showed an ABI of 1 on the right and one on the left. TBI of 1.4 on the right and 0.79 on the left. She had normal waveforms. She was in the emergency room on 02/28/16; there is a noted edema of her left leg after a fall. She had a duplex ultrasound that showed no evidence for an acute DVT from the left groin to the popliteal fossa. The study was limited in the calf veins due to edema. Also noticeable that she had a left inguinal enlarged lymph node up to 6.2 cm. She also had a left knee x-ray that showed no acute findings and a right foot x-ray that showed soft tissue swelling but no radiographic evidence of osteomyelitis. The patient does stop smoking 03-19-16 Ms Surratt presents today for evaluation of her bilateral lower  extremity ulcerations. She states that she has not smoked in several weeks. She denies any pain or discomfort to bilateral lower extremities, tolerating compression therapy as ordered. 03/26/16; I have not seen this patient in 2 weeks however she has done very well with improvements in the areas in question. After we obtain normal arterial studies, increasing the 4-layer compression really seems to look done a nice job here. She has one open area on the left lateral leg and one on the medial right leg. These have filled in nicely and are now superficial wounds that showed epithelialized. I note  the left inguinal lymph node at 6.2 cm. I may need to refer this back to the patient's primary doctor. Electronic Signature(s) Signed: 03/26/2016 6:11:07 PM By: Linton Ham MD Entered By: Linton Ham on 03/26/2016 17:33:34 [name] ([id number]) -------------------------------------------------------------------------------- Physical Exam Details Patient Name: [name] Date of Service: 03/26/2016 2:15 PM Medical Record Patient Account Number: [id number] Number: Treating RN: Montey Hora 1950-09-15 (65 y.o. Other Clinician: Date of Birth/Sex: Female) Treating Babbie Dondlinger Primary Care Physician/Extender: Peri Jefferson, [place] Physician: Referring Physician: LADA, MELINDA Weeks in Treatment: 18 Constitutional Sitting or standing Blood Pressure is within target range for patient.. Pulse regular and within target range for patient.Marland Kitchen Respirations regular, non-labored and within target range.. Temperature is normal and within the target range for the patient.. Patient has significant MS but only complains of fatigue. Eyes Conjunctivae clear. No discharge.Marland Kitchen Respiratory Respiratory effort is easy and symmetric bilaterally. Rate is normal at rest and on room air.. Cardiovascular Pedal pulses palpable and strong bilaterally.. Gastrointestinal (GI) Abdomen is soft and non-distended without masses or tenderness. Bowel sounds active in all quadrants.. Lymphatic None palpable in the popliteal or inguinal area. Psychiatric No evidence of depression, anxiety, or agitation. Calm, cooperative, and communicative. Appropriate interactions and affect.. Notes Wound exam; the patient has a remaining wound on the left lateral and right medial calf. Both of these appear to have healthy granulation. No debridement was required. The one on the medial right leg appears to be epithelializing nicely. At one point these were punched-out areas and they really appeared to  have filled in nicely Electronic Signature(s) Signed: 03/26/2016 6:11:07 PM By: Linton Ham MD Entered By: Linton Ham on 03/26/2016 17:42:08 [name] ([id number]) -------------------------------------------------------------------------------- Physician Orders Details Patient Name: [name] Date of Service: 03/26/2016 2:15 PM Medical Record Patient Account Number: [id number] Number: Treating RN: Montey Hora 05-30-1950 (65 y.o. Other Clinician: Date of Birth/Sex: Female) Treating Tameeka Luo Primary Care Physician/Extender: Peri Jefferson, [place] Physician: Referring Physician: LADA, MELINDA Weeks in Treatment: 69 Verbal / Phone Orders: Yes Clinician: Dorthy, Joanna Read Back and Verified: Yes Diagnosis Coding Wound Cleansing Wound #3 Right Lower Leg o Cleanse wound with mild soap and water - HHRN to wash legs with soap and water o No tub bath. Wound #5 Left,Distal,Lateral Lower Leg o Cleanse wound with mild soap and water - HHRN to wash legs with soap and water o No tub bath. Anesthetic Wound #3 Right Lower Leg o Topical Lidocaine 4% cream applied to wound bed prior to debridement - in clinic only Wound #5 Left,Distal,Lateral Lower Leg o Topical Lidocaine 4% cream applied to wound bed prior to debridement - in clinic only Primary Wound Dressing Wound #3 Right Lower Leg o Prisma Ag - or collagen with silver equivalent - silver alginate is not collagen Wound #5 Left,Distal,Lateral Lower Leg o Prisma Ag - or collagen with silver equivalent - silver alginate  is not collagen Secondary Dressing Wound #3 Right Lower Leg o ABD pad o Dry Gauze Wound #5 Left,Distal,Lateral Lower Leg o ABD pad o Dry Gauze Dressing Change Frequency [place], Ysidra H. ([id number]) Wound #3 Right Lower Leg o Three times weekly - [facility] to change Monday and Friday Pt comes to clinic Wednesday Wound #5 Left,Distal,Lateral Lower Leg o Three  times weekly - [facility] to change Monday and Friday Pt comes to clinic Wednesday Follow-up Appointments Wound #3 Right Lower Leg o Return Appointment in 1 week. Wound #5 Left,Distal,Lateral Lower Leg o Return Appointment in 1 week. Edema Control Wound #3 Right Lower Leg o 4 Layer Compression System - Bilateral - use 1 layer of unna wrap to anchor at the top o Patient to wear own compression stockings - HHRN please order compression hose for patient 20-61mmHg o Elevate legs to the level of the heart and pump ankles as often as possible Wound #5 Left,Distal,Lateral Lower Leg o 4 Layer Compression System - Bilateral - use 1 layer of unna wrap to anchor at the top o Patient to wear own compression stockings - HHRN please order compression hose for patient 20-52mmHg o Elevate legs to the level of the heart and pump ankles as often as possible Additional Orders / Instructions Wound #3 Right Lower Leg o Stop Smoking o Increase protein intake. Wound #5 Left,Distal,Lateral Lower Leg o Stop Smoking o Increase protein intake. Home Health Wound #3 Right Lower Leg o [place] Visits - [facility]---[facility] to change wraps Monday and Friday *********[facility] please order compression hose for patient 20-66mmHg********* o Home Health Nurse may visit PRN to address patientos wound care needs. o FACE TO FACE ENCOUNTER: MEDICARE and MEDICAID PATIENTS: I certify that this patient is under my care and that I had a face-to-face encounter that meets the physician face-to-face encounter requirements with this patient on this date. The encounter with the patient was in whole or in part for the following MEDICAL CONDITION: (primary reason for [place]) MEDICAL NECESSITY: I certify, that based on my findings, NURSING services are a medically necessary home health service. HOME BOUND STATUS: I certify that my clinical findings SHANNIN, MAYO. ([id number]) support that this  patient is homebound (i.e., Due to illness or injury, pt requires aid of supportive devices such as crutches, cane, wheelchairs, walkers, the use of special transportation or the assistance of another person to leave their place of residence. There is a normal inability to leave the home and doing so requires considerable and taxing effort. Other absences are for medical reasons / religious services and are infrequent or of short duration when for other reasons). o If current dressing causes regression in wound condition, may D/C ordered dressing product/s and apply Normal Saline Moist Dressing daily until next [place] / Other MD appointment. [place] of regression in wound condition at [phone]. o Please direct any NON-WOUND related issues/requests for orders to patient's Primary Care Physician Wound #5 Left,Distal,Lateral Lower Leg o [place] Visits - Advanced---[facility] to change wraps Monday and Friday *********[facility] please order compression hose for patient 20-51mmHg********* o Home Health Nurse may visit PRN to address patientos wound care needs. o FACE TO FACE ENCOUNTER: MEDICARE and MEDICAID PATIENTS: I certify that this patient is under my care and that I had a face-to-face encounter that meets the physician face-to-face encounter requirements with this patient on this date. The encounter with the patient was in whole or in part for the following MEDICAL  CONDITION: (primary reason for Home Healthcare) MEDICAL NECESSITY: I certify, that based on my findings, NURSING services are a medically necessary home health service. HOME BOUND STATUS: I certify that my clinical findings support that this patient is homebound (i.e., Due to illness or injury, pt requires aid of supportive devices such as crutches, cane, wheelchairs, walkers, the use of special transportation or the assistance of another person to leave their place of residence.  There is a normal inability to leave the home and doing so requires considerable and taxing effort. Other absences are for medical reasons / religious services and are infrequent or of short duration when for other reasons). o If current dressing causes regression in wound condition, may D/C ordered dressing product/s and apply Normal Saline Moist Dressing daily until next [place] / Other MD appointment. [place] of regression in wound condition at [phone]. o Please direct any NON-WOUND related issues/requests for orders to patient's Primary Care Physician Electronic Signature(s) Signed: 03/26/2016 6:05:10 PM By: Montey Hora Signed: 03/26/2016 6:11:07 PM By: Linton Ham MD Entered By: Montey Hora on 03/26/2016 14:56:06 [name] ([id number]) -------------------------------------------------------------------------------- Problem List Details Patient Name: [name] Date of Service: 03/26/2016 2:15 PM Medical Record Patient Account Number: [id number] Number: Treating RN: Montey Hora Nov 28, 1950 (65 y.o. 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 11/20/2015 Yes exposed L97.512 Non-pressure chronic ulcer of other part of right foot with 11/20/2015 Yes fat layer exposed L97.222 Non-pressure chronic ulcer of left calf with fat layer 11/20/2015 Yes exposed G35 Multiple sclerosis 11/20/2015 Yes E66.01 Morbid (severe) obesity due to excess calories 11/20/2015 Yes F17.218 Nicotine dependence, cigarettes, with other nicotine- 11/20/2015 Yes induced disorders Inactive Problems Resolved Problems Electronic Signature(s) Signed: 03/26/2016  6:11:07 PM By: Linton Ham MD Entered By: Linton Ham on 03/26/2016 17:29:46 [place], [name] ([name] ([id number]) -------------------------------------------------------------------------------- Progress Note Details Patient Name: [name] Date of Service: 03/26/2016 2:15 PM Medical Record Patient Account Number: [id number] Number: Treating RN: Montey Hora April 14, 1951 (65 y.o. Other Clinician: Date of Birth/Sex: Female) Treating Sky Primo Primary Care Physician/Extender: Peri Jefferson, [place] Physician: Referring Physician: LADA, MELINDA Weeks in Treatment: 18 Subjective Chief Complaint Information obtained from Patient returns for evaluation of bilateral lower extremity ulcerations History of Present Illness (HPI) The following HPI elements were documented for the patient's wound: Location: left and right lower extremity and dorsum of the right foot Quality: Patient reports experiencing a sharp pain to affected area(s). Severity: Patient rates the pain to be a 8 out of 10 especially with palpation Duration: Patient has had the wound for > 6 months prior to seeking treatment at the wound center Timing: Pain in wound is Intermittent in severity but persistent Context: The wound appeared gradually over time Modifying Factors: Other treatment(s) tried include: symptomatic treatment as advised by her PCP Associated Signs and Symptoms: Patient reports having increase swelling iin her bilateral lower extremities 65 year old with a past medical history significant for MS, urinary incontinence, and obesity. She has been seen in the wound clinic before for lower extremity ulcerations treated with compression therapy. she is also known to have hypertension, peripheral vascular disease, COPD, obstructive sleep apnea, lumbar radiculopathy, kyphoscoliosis, urinary issues and tobacco abuse. Smokes a packet of cigarettes a day was recently  seen at the  [facility] for swelling of her legs and feet with a ulceration on the dorsum of the right foot which has been there for about 6 months. she was recently in the ER about a month ago where she was seen for shortness of breath and swelling of the legs and a chest x-ray was within normal limits had an increase in her BNP and was given Lasix and put on doxycycline for a mild cellulitis and possible UTI. Wounds aresmaller today 11/13/15. Debrided and will continue Santyl. 12/21/2015 -- she was admitted to the hospital overnight on 12/18/2015 and diagnosed with urinary retention and cellulitis of the left lower leg. is past to take clindamycin and use Santyl for her wounds. 01/15/2016 -- come back to see [place] for almost a month and continues to be noncompliant with her dressings 01/30/16 patient presents today for a follow-up visit concerning her ongoing bilateral lower extremity wounds. We have not seen her for the past 2 weeks although we are supposed to be seen her for weekly visits. She currently has a Foley catheter. She tells me that the wounds are intensely painful especially with pressure and palpation at this point in time. Fortunately she is having no interval signs or symptoms of systemic infection but unfortunately the wounds have not improved dramatically since we last saw her. She does Santor, Laterica H. ([id number]) have home health coming to take care of her as well. She is currently not in any compression wraps. 02/06/16 ON evaluation today patient continues to experience discomfort regarding her bilateral lower extremity ulcers. She has continued to tolerate the dressing changes at this point in time and continues to have a Foley catheter as well. Fortunately she is back this week in the past she has been somewhat noncompliant with follow-ups I'm glad to see her today. She tells me that her pain level varies but can be as high as a 7 out of 10 with manipulation of the wound.  She tells me that she used to be on oxycodone which was managed by the pain clinic although she is no longer on that as she tells me that she was actually smoking marijuana at the time and when this was found out they discontinued her pain medication. She no longer is taking anything pain medication wise and she also does not smoke cigarettes nor marijuana at this point. 02/12/16; this is a patient I don't believe I have previously seen. She has multiple sclerosis. She has 4 punched-out areas on the anterior lateral left leg 2 areas on the right these are all in the same condition. Reasonably small [dime size] wounds each was some depth. Surface of these wounds does not look particularly healthy as there is adherent slough. There is no evidence of surrounding infection or inflammation. ABIs in this clinic were 0.87 on the right and 0.81 on the left. She is listed as having PAD and is a smoker. Not a diabetic 02/20/16 patient I gave antibiotics to last week for erythema around both wound sites on the left lateral leg and right lateral leg. This appears to be a lot better. One of the areas on the left leg is healed however she still has 3 punched-out open areas on the left lateral calf and one on the right lateral calf and one on the dorsal foot. She is an ex-smoker quitting 1 month ago. ABIs in this clinic were 0.87 on the right 0.81 on the left 03/12/16; this is a patient  I really don't have a good sense of. It would appear for the first 5 or 6 weeks of her stay in this clinic she was cared for by Dr. Con Memos. She appeared on my schedule in mid October and I saw her twice. She has not been here however in 3 weeks. She has advanced Wound Care at home where she lives with her husband. She has multiple sclerosis. I saw her the first time she had 4 punched-out small wounds on the anterior lateral left leg it appears that she is now down to 2. She also had wounds on the lateral and medial aspect of  her right leg which were also small and punched out. The only one that remains is on the right lateral. Because of the nature of her wound I went ahead and ordered formal arterial studies this showed an ABI of 1 on the right and one on the left. TBI of 1.4 on the right and 0.79 on the left. She had normal waveforms. She was in the emergency room on 02/28/16; there is a noted edema of her left leg after a fall. She had a duplex ultrasound that showed no evidence for an acute DVT from the left groin to the popliteal fossa. The study was limited in the calf veins due to edema. Also noticeable that she had a left inguinal enlarged lymph node up to 6.2 cm. She also had a left knee x-ray that showed no acute findings and a right foot x-ray that showed soft tissue swelling but no radiographic evidence of osteomyelitis. The patient does stop smoking 03-19-16 Ms Treinen presents today for evaluation of her bilateral lower extremity ulcerations. She states that she has not smoked in several weeks. She denies any pain or discomfort to bilateral lower extremities, tolerating compression therapy as ordered. 03/26/16; I have not seen this patient in 2 weeks however she has done very well with improvements in the areas in question. After we obtain normal arterial studies, increasing the 4-layer compression really seems to look done a nice job here. She has one open area on the left lateral leg and one on the medial right leg. These have filled in nicely and are now superficial wounds that showed epithelialized. I note the left inguinal lymph node at 6.2 cm. I may need to refer this back to the patient's primary doctor. Objective Salzman, Devery H. ([id number]) Constitutional Sitting or standing Blood Pressure is within target range for patient.. Pulse regular and within target range for patient.Marland Kitchen Respirations regular, non-labored and within target range.. Temperature is normal and within the target range for the  patient.. Patient has significant MS but only complains of fatigue. Vitals Time Taken: 2:32 PM, Height: 63 in, Weight: 270 lbs, BMI: 47.8, Temperature: 98.1 F, Pulse: 74 bpm, Respiratory Rate: 20 breaths/min, Blood Pressure: 134/82 mmHg. Respiratory Respiratory effort is easy and symmetric bilaterally. Rate is normal at rest and on room air.. Cardiovascular Pedal pulses palpable and strong bilaterally.. Gastrointestinal (GI) Abdomen is soft and non-distended without masses or tenderness. Bowel sounds active in all quadrants.. Lymphatic None palpable in the popliteal or inguinal area. General Notes: Wound exam; the patient has a remaining wound on the left lateral and right medial calf. Both of these appear to have healthy granulation. No debridement was required. The one on the medial right leg appears to be epithelializing nicely. At one point these were punched-out areas and they really appeared to have filled in nicely Integumentary (Hair, Skin) Wound #3 status is  Open. Original cause of wound was Trauma. The wound is located on the Right Lower Leg. The wound measures 0.8cm length x 0.4cm width x 0.1cm depth; 0.251cm^2 area and 0.025cm^3 volume. The wound is limited to skin breakdown. There is a large amount of serous drainage noted. The wound margin is flat and intact. There is large (67-100%) pink granulation within the wound bed. There is a small (1-33%) amount of necrotic tissue within the wound bed including Adherent Slough. The periwound skin appearance exhibited: Erythema. The periwound skin appearance did not exhibit: Callus, Crepitus, Excoriation, Fluctuance, Friable, Induration, Localized Edema, Rash, Scarring, Dry/Scaly, Maceration, Moist, Atrophie Blanche, Cyanosis, Ecchymosis, Hemosiderin Staining, Mottled, Pallor, Rubor. The surrounding wound skin color is noted with erythema which is circumferential. Periwound temperature was noted as No Abnormality. The periwound has  tenderness on palpation. Wound #4 status is Healed - Epithelialized. Original cause of wound was Trauma. The wound is located on the Left,Proximal,Lateral Lower Leg. The wound measures 0cm length x 0cm width x 0cm depth; 0cm^2 area and 0cm^3 volume. The wound is limited to skin breakdown. There is no tunneling or undermining noted. There is a none present amount of drainage noted. The wound margin is flat and intact. There is no granulation within the wound bed. There is no necrotic tissue within the wound bed. The periwound skin appearance exhibited: Excoriation, Moist, Erythema. The periwound skin appearance did not exhibit: Callus, Crepitus, Fluctuance, Friable, Induration, Localized Edema, Rash, Scarring, Dry/Scaly, Maceration, Atrophie Blanche, Cyanosis, Ecchymosis, Hemosiderin Staining, Mottled, Pallor, Rubor. The surrounding wound skin color is noted with erythema which is circumferential. Periwound temperature was noted as No Abnormality. The periwound has tenderness on palpation. ABIOLA, KUEHNLE ([id number]) Wound #5 status is Open. Original cause of wound was Gradually Appeared. The wound is located on the Left,Distal,Lateral Lower Leg. The wound measures 1.6cm length x 1.1cm width x 0.1cm depth; 1.382cm^2 area and 0.138cm^3 volume. The wound is limited to skin breakdown. There is no tunneling or undermining noted. There is a large amount of serous drainage noted. The wound margin is flat and intact. There is large (67-100%) pink granulation within the wound bed. There is a small (1-33%) amount of necrotic tissue within the wound bed including Adherent Slough. The periwound skin appearance exhibited: Moist, Erythema. The periwound skin appearance did not exhibit: Callus, Crepitus, Excoriation, Fluctuance, Friable, Induration, Localized Edema, Rash, Scarring, Dry/Scaly, Maceration, Atrophie Blanche, Cyanosis, Ecchymosis, Hemosiderin Staining, Mottled, Pallor, Rubor. The surrounding wound  skin color is noted with erythema which is circumferential. Periwound temperature was noted as No Abnormality. The periwound has tenderness on palpation. Wound #7 status is Healed - Epithelialized. Original cause of wound was Gradually Appeared. The wound is located on the Left,Medial Lower Leg. The wound measures 0cm length x 0cm width x 0cm depth; 0cm^2 area and 0cm^3 volume. The wound is limited to skin breakdown. There is no tunneling or undermining noted. There is a none present amount of drainage noted. The wound margin is flat and intact. There is no granulation within the wound bed. There is no necrotic tissue within the wound bed. The periwound skin appearance exhibited: Moist, Erythema. The surrounding wound skin color is noted with erythema which is circumferential. Periwound temperature was noted as No Abnormality. The periwound has tenderness on palpation. 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 Wound #5 Left,Distal,Lateral Lower Leg: Cleanse wound with mild soap and water - HHRN to wash legs with soap and water Stoecker, Sadhana H. ([id number]) No tub bath. Anesthetic: Wound #3 Right Lower Leg: Topical Lidocaine 4% cream applied to wound bed prior to debridement - in clinic only Wound #5 Left,Distal,Lateral Lower Leg: Topical Lidocaine 4% cream applied to wound bed prior to debridement - in clinic only Primary Wound Dressing: Wound #3 Right Lower  Leg: Prisma Ag - or collagen with silver equivalent - silver alginate is not collagen Wound #5 Left,Distal,Lateral Lower Leg: Prisma Ag - or collagen with silver equivalent - silver alginate is not collagen Secondary Dressing: Wound #3 Right Lower Leg: ABD pad Dry Gauze Wound #5 Left,Distal,Lateral Lower Leg: ABD pad Dry Gauze Dressing Change Frequency: Wound #3 Right Lower Leg: Three times weekly - [facility] to change Monday and Friday Pt comes to clinic Wednesday Wound #5 Left,Distal,Lateral Lower Leg: Three times weekly - [facility] to change Monday and Friday Pt comes to clinic Wednesday Follow-up Appointments: Wound #3 Right Lower Leg: Return Appointment in 1 week. Wound #5 Left,Distal,Lateral Lower Leg: Return Appointment in 1 week. Edema Control: Wound #3 Right Lower Leg: 4 Layer Compression System - Bilateral - use 1 layer of unna wrap to anchor at the top Patient to wear own compression stockings - [facility]N please order compression hose for patient 20- 13mmHg Elevate legs to the level of the heart and pump ankles as often as possible Wound #5 Left,Distal,Lateral Lower Leg: 4 Layer Compression System - Bilateral - use 1 layer of unna wrap to anchor at the top Patient to wear own compression stockings - HHRN please order compression hose for patient 20- 5mmHg Elevate legs to the level of the heart and pump ankles as often as possible Additional Orders / Instructions: Wound #3 Right Lower Leg: Stop Smoking Increase protein intake. Wound #5 Left,Distal,Lateral Lower Leg: Stop Smoking Increase protein intake. Home Health: Wound #3 Right Lower Leg: Continue Home Health Visits - [facility] to change wraps Monday and Friday *********[facility] please order compression hose for patient 20-87mmHg********* Home Health Nurse may visit PRN to address patient s wound care needs. HARLOE, FRUEHAUF ([id number]) FACE TO FACE ENCOUNTER: MEDICARE and MEDICAID PATIENTS: I certify that this patient is  under my care and that I had a face-to-face encounter that meets the physician face-to-face encounter requirements with this patient on this date. The encounter with the patient was in whole or in part for the following MEDICAL CONDITION: (primary reason for [place]) MEDICAL NECESSITY: I certify, that based on my findings, NURSING services are a medically necessary home health service. HOME BOUND STATUS: I certify that my clinical findings support that this patient is homebound (i.e., Due to illness or injury, pt requires aid of supportive devices such as crutches, cane, wheelchairs, walkers, the use of special transportation or the assistance of another person to leave their place of residence. There is a normal inability to leave the home and doing so requires considerable and taxing effort. Other absences are for medical reasons / religious services and are infrequent or of short duration when for other reasons). If current dressing causes regression in wound  condition, may D/C ordered dressing product/s and apply Normal Saline Moist Dressing daily until next [place] / Other MD appointment. [place] of regression in wound condition at [phone]. Please direct any NON-WOUND related issues/requests for orders to patient's Primary Care Physician Wound #5 Left,Distal,Lateral Lower Leg: [place] Visits - [facility]---[facility] to change wraps Monday and Friday *********[facility] please order compression hose for patient 20-44mmHg********* Home Health Nurse may visit PRN to address patient s wound care needs. FACE TO FACE ENCOUNTER: MEDICARE and MEDICAID PATIENTS: I certify that this patient is under my care and that I had a face-to-face encounter that meets the physician face-to-face encounter requirements with this patient on this date. The encounter with the patient was in whole or in part for the following MEDICAL CONDITION: (primary reason for [place]) MEDICAL NECESSITY: I certify, that based on my findings, NURSING services are a medically necessary home health service. HOME BOUND STATUS: I certify that my clinical findings support that this patient is homebound (i.e., Due to illness or injury, pt requires aid of supportive devices such as crutches, cane, wheelchairs, walkers, the use of special transportation or the assistance of another person to leave their place of residence. There is a normal inability to leave the home and doing so requires considerable and taxing effort. Other absences are for medical reasons / religious services and are infrequent or of short duration when for other reasons). If current dressing causes regression in wound condition, may D/C ordered dressing product/s and apply Normal Saline Moist Dressing daily until next [place] / Other MD appointment. [place] of regression in wound condition at [phone]. Please direct any NON-WOUND related issues/requests for orders to patient's Primary Care Physician #1 think we continue with the same dressings bilaterally which includes Prisma, ABDs and Profore wraps. She appears to be tolerating these well. #2 she has a history of wounds in her legs. I think we are going to need to find her bilateral juxtalite stockings before we can consider discharge #3 her duplex ultrasound for DVT documented a enlargement of a lymph node in her left inguinal area. This was at 6.2 cm. I'm going to need to consider reimaging that worked sending her back to primary for care for workup. This is large enough to consider a biopsy by general surgery VAUNDA, RAPOPORT ([id number]) Electronic Signature(s) Signed: 03/26/2016 6:11:07 PM By: Linton Ham MD Entered By: Linton Ham on 03/26/2016 17:43:05 Gesner, [name] ([id number]) -------------------------------------------------------------------------------- SuperBill Details Patient Name: [name] Date of Service: 03/26/2016 Medical Record Patient Account Number: [id number] Number: Treating RN: Montey Hora [birth year] (65 y.o. 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 Electronic Signature(s) Signed: 03/26/2016 4:05:56 PM By: Montey Hora Signed: 03/26/2016 6:11:07 PM By: Linton Ham MD Entered By: Montey Hora on 03/26/2016 16:05:56

## 2016-03-27 NOTE — Progress Notes (Signed)
[name], [name] ([id number]) Visit Report for 03/26/2016 Arrival Information Details Patient Name: [name] Date of Service: 03/26/2016 2:15 PM Medical Record Patient Account Number: [id number] Number: Treating RN: Montey Hora [birth year] (65 y.o. Other Clinician: Date of Birth/Sex: Female) Treating ROBSON, [place] Primary Care Physician: LADA, MELINDA Physician/Extender: G Referring Physician: LADA, MELINDA Weeks in Treatment: 18 Visit Information History Since Last Visit Added or deleted any medications: No Patient Arrived: Wheel Chair Any new allergies or adverse reactions: No Arrival Time: 14:29 Had a fall or experienced change in No activities of daily living that may affect Accompanied By: self risk of falls: Transfer Assistance: None Signs or symptoms of abuse/neglect since last No Patient Identification Verified: Yes visito Secondary Verification Process Yes Hospitalized since last visit: No Completed: Pain Present Now: Yes Patient Requires Transmission-Based No Precautions: Patient Has Alerts: Yes Electronic Signature(s) Signed: 03/26/2016 6:05:10 PM By: Montey Hora Entered By: Montey Hora on 03/26/2016 14:32:32 [name] ([id number]) -------------------------------------------------------------------------------- Encounter Discharge Information Details Patient Name: [name] Date of Service: 03/26/2016 2:15 PM Medical Record Patient Account Number: [id number] Number: Treating RN: Montey Hora Sep 08, 1950 (65 y.o. Other Clinician: Date of Birth/Sex: Female) Treating ROBSON, [place] Primary Care Physician: LADA, MELINDA Physician/Extender: G Referring Physician: LADA, MELINDA Weeks in Treatment: 45 Encounter Discharge Information Items Discharge Pain Level: 0 Discharge Condition: Stable Ambulatory Status: Wheelchair Discharge Destination: Home Transportation: Private Auto Accompanied By: self Schedule  Follow-up Appointment: Yes Medication Reconciliation completed and provided to Patient/Care No [name]: Provided on Clinical Summary of Care: 03/26/2016 Form Type Recipient Paper Patient BW Electronic Signature(s) Signed: 03/26/2016 4:06:36 PM By: Montey Hora Previous Signature: 03/26/2016 3:14:48 PM Version By: Ruthine Dose Entered By: Montey Hora on 03/26/2016 16:06:36 [name], [name] ([id number]) -------------------------------------------------------------------------------- Lower Extremity Assessment Details Patient Name: [name] Date of Service: 03/26/2016 2:15 PM Medical Record Patient Account Number: [id number] Number: Treating RN: Montey Hora November 14, 1950 (65 y.o. Other Clinician: Date of Birth/Sex: Female) Treating ROBSON, [place] Primary Care Physician: LADA, MELINDA Physician/Extender: G Referring Physician: LADA, MELINDA Weeks in Treatment: 18 Edema Assessment Assessed: [Left: No] [Right: No] Edema: [Left: Yes] [Right: Yes] Calf Left: Right: Point of Measurement: 33 cm From Medial Instep cm cm Ankle Left: Right: Point of Measurement: 11 cm From Medial Instep cm cm Vascular Assessment Pulses: Posterior Tibial Dorsalis Pedis Palpable: [Left:Yes] [Right:Yes] Extremity colors, hair growth, and conditions: Extremity Color: [Left:Hyperpigmented] [Right:Hyperpigmented] Hair Growth on Extremity: [Left:No] [Right:No] Temperature of Extremity: [Left:Warm] [Right:Warm] Capillary Refill: [Left:< 3 seconds] [Right:< 3 seconds] Electronic Signature(s) Signed: 03/26/2016 6:05:10 PM By: Montey Hora Entered By: Montey Hora on 03/26/2016 14:55:04 [name], [name] ([id number]) -------------------------------------------------------------------------------- Multi Wound Chart Details Patient Name: [name] Date of Service: 03/26/2016 2:15 PM Medical Record Patient Account Number: [id number] Number: Treating RN: Montey Hora [birth year] (65 y.o. Other Clinician: Date of Birth/Sex: Female) Treating ROBSON, MICHAEL Primary Care Physician: LADA, MELINDA Physician/Extender: G Referring Physician: LADA, MELINDA Weeks in Treatment: 18 Vital Signs Height(in): 63 Pulse(bpm): 74 Weight(lbs): 270 Blood Pressure 134/82 (mmHg): Body Mass Index(BMI): 48 Temperature(F): 98.1 Respiratory Rate 20 (breaths/min): Photos: Wound Location: Right Lower Leg Left Lower Leg - Lateral, Left Lower Leg - Lateral, Proximal Distal Wounding Event: Trauma Trauma Gradually Appeared Primary Etiology: Trauma, Other Venous Leg Ulcer Venous Leg Ulcer Comorbid History: Cataracts, Chronic Cataracts, Chronic Cataracts, Chronic Obstructive Pulmonary Obstructive Pulmonary Obstructive Pulmonary Disease (COPD), Sleep Disease (COPD), Sleep Disease (COPD), Sleep Apnea, Hypertension, Apnea, Hypertension, Apnea, Hypertension, Osteoarthritis, Neuropathy Osteoarthritis, Neuropathy Osteoarthritis, Neuropathy  Date Acquired: 10/09/2015 10/09/2015 12/21/2015 Weeks of Treatment: 18 18 13  Wound Status: Open Healed - Epithelialized Open Clustered Wound: No Yes No Measurements L x W x D 0.8x0.4x0.1 0x0x0 1.6x1.1x0.1 (cm) Area (cm) : 0.251 0 1.382 Volume (cm) : 0.025 0 0.138 % Reduction in Area: 49.30% 100.00% -151.30% % Reduction in Volume: 83.10% 100.00% -150.90% Classification: Partial Thickness Partial Thickness Partial Thickness Exudate Amount: Large None Present Large [name], [name] H. ([id number]) Exudate Type: Serous N/A Serous Exudate Color: amber N/A amber Wound Margin: Flat and Intact Flat and Intact Flat and Intact Granulation Amount: Large (67-100%) None Present (0%) Large (67-100%) Granulation Quality: Pink N/A Pink Necrotic Amount: Small (1-33%) None Present (0%) Small (1-33%) Exposed Structures: Fascia: No Fascia: No Fascia: No Fat: No Fat: No Fat: No Tendon: No Tendon: No Tendon: No Muscle: No Muscle: No Muscle:  No Joint: No Joint: No Joint: No Bone: No Bone: No Bone: No Limited to Skin Limited to Skin Limited to Skin Breakdown Breakdown Breakdown Epithelialization: Medium (34-66%) Large (67-100%) None Periwound Skin Texture: Edema: No Excoriation: Yes Edema: No Excoriation: No Edema: No Excoriation: No Induration: No Induration: No Induration: No Callus: No Callus: No Callus: No Crepitus: No Crepitus: No Crepitus: No Fluctuance: No Fluctuance: No Fluctuance: No Friable: No Friable: No Friable: No Rash: No Rash: No Rash: No Scarring: No Scarring: No Scarring: No Periwound Skin Maceration: No Moist: Yes Moist: Yes Moisture: Moist: No Maceration: No Maceration: No Dry/Scaly: No Dry/Scaly: No Dry/Scaly: No Periwound Skin Color: Erythema: Yes Erythema: Yes Erythema: Yes Atrophie Blanche: No Atrophie Blanche: No Atrophie Blanche: No Cyanosis: No Cyanosis: No Cyanosis: No Ecchymosis: No Ecchymosis: No Ecchymosis: No Hemosiderin Staining: No Hemosiderin Staining: No Hemosiderin Staining: No Mottled: No Mottled: No Mottled: No Pallor: No Pallor: No Pallor: No Rubor: No Rubor: No Rubor: No Erythema Location: Circumferential Circumferential Circumferential Temperature: No Abnormality No Abnormality No Abnormality Tenderness on Yes Yes Yes Palpation: Wound Preparation: Ulcer Cleansing: Other: Ulcer Cleansing: Other: Ulcer Cleansing: Other: soap and water soap and water soap and water Topical Anesthetic Topical Anesthetic Topical Anesthetic Applied: Other: lidocaine Applied: None Applied: Other: lidocaine 4% 4% Wound Number: 7 N/A N/A Photos: N/A N/A [name], [name] ([id number]) Wound Location: Left Lower Leg - Medial N/A N/A Wounding Event: Gradually Appeared N/A N/A Primary Etiology: Venous Leg Ulcer N/A N/A Comorbid History: Cataracts, Chronic N/A N/A Obstructive Pulmonary Disease (COPD), Sleep Apnea, Hypertension, Osteoarthritis, Neuropathy Date  Acquired: 02/06/2016 N/A N/A Weeks of Treatment: 7 N/A N/A Wound Status: Healed - Epithelialized N/A N/A Clustered Wound: No N/A N/A Measurements L x W x D 0x0x0 N/A N/A (cm) Area (cm) : 0 N/A N/A Volume (cm) : 0 N/A N/A % Reduction in Area: 100.00% N/A N/A % Reduction in Volume: 100.00% N/A N/A Classification: Partial Thickness N/A N/A Exudate Amount: None Present N/A N/A Exudate Type: N/A N/A N/A Exudate Color: N/A N/A N/A Wound Margin: Flat and Intact N/A N/A Granulation Amount: None Present (0%) N/A N/A Granulation Quality: N/A N/A N/A Necrotic Amount: None Present (0%) N/A N/A Exposed Structures: Fascia: No N/A N/A Fat: No Tendon: No Muscle: No Joint: No Bone: No Limited to Skin Breakdown Epithelialization: Large (67-100%) N/A N/A Periwound Skin Texture: No Abnormalities Noted N/A N/A Periwound Skin Moist: Yes N/A N/A Moisture: Periwound Skin Color: Erythema: Yes N/A N/A Erythema Location: Circumferential N/A N/A Temperature: No Abnormality N/A N/A Tenderness on Yes N/A N/A Palpation: Wound Preparation: Ulcer Cleansing: Other: N/A N/A soap and water [name], [name] H. ([id number]) Topical  Anesthetic Applied: None Treatment Notes Electronic Signature(s) Signed: 03/26/2016 6:05:10 PM By: Montey Hora Entered By: Montey Hora on 03/26/2016 14:55:21 [name] -------------------------------------------------------------------------------- Multi-Disciplinary Care Plan Details Patient Name: [name] Date of Service: 03/26/2016 2:15 PM Medical Record Patient Account Number: [id number] Number: Treating RN: Montey Hora 01-31-1951 (65 y.o. Other Clinician: Date of Birth/Sex: Female) Treating ROBSON, [place] Primary Care Physician: LADA, MELINDA Physician/Extender: G Referring Physician: LADA, MELINDA Weeks in Treatment: 25 Active Inactive Abuse / Safety / Falls / Self Care Management Nursing Diagnoses: Impaired  physical mobility Potential for falls Goals: Patient will remain injury free Date Initiated: 11/20/2015 Goal Status: Active Interventions: Assess fall risk on admission and as needed Notes: Nutrition Nursing Diagnoses: Imbalanced nutrition Goals: Patient/caregiver agrees to and verbalizes understanding of need to use nutritional supplements and/or vitamins as prescribed Date Initiated: 11/20/2015 Goal Status: Active Interventions: Provide education on nutrition Treatment Activities: Education provided on Nutrition : 03/19/2016 Notes: Orientation to the [facility] [name], [name] Nursing Diagnoses: Knowledge deficit related to the wound healing center program Goals: Patient/caregiver will verbalize understanding of the [facility] Program Date Initiated: 11/20/2015 Goal Status: Active Interventions: Provide education on orientation to the wound center Notes: Wound/Skin Impairment Nursing Diagnoses: Impaired tissue integrity Goals: Patient will demonstrate a reduced rate of smoking or cessation of smoking Date Initiated: 11/20/2015 Goal Status: Active Ulcer/skin breakdown will heal within 14 weeks Date Initiated: 11/20/2015 Goal Status: Active Interventions: Assess patient/caregiver ability to obtain necessary supplies Provide education on smoking Notes: Electronic Signature(s) Signed: 03/26/2016 6:05:10 PM By: Montey Hora Entered By: Montey Hora on 03/26/2016 14:55:14 [name] -------------------------------------------------------------------------------- Pain Assessment Details Patient Name: [name] Date of Service: 03/26/2016 2:15 PM Medical Record Patient Account Number: [id number] Number: Treating RN: Montey Hora [birth year] (65 y.o. Other Clinician: Date of Birth/Sex: Female) Treating ROBSON, [place] Primary Care Physician: LADA, MELINDA Physician/Extender: G Referring Physician:  LADA, MELINDA Weeks in Treatment: 18 Active Problems Location of Pain Severity and Description of Pain Patient Has Paino Yes Site Locations Pain Location: Pain in Ulcers With Dressing Change: Yes Pain Management and Medication Current Pain Management: Notes Topical or injectable lidocaine is offered to patient for acute pain when surgical debridement is performed. If needed, Patient is instructed to use over the counter pain medication for the following 24-48 hours after debridement. Wound care MDs do not prescribed pain medications. Patient has chronic pain or uncontrolled pain. Patient has been instructed to make an appointment with their Primary Care Physician for pain management. Electronic Signature(s) Signed: 03/26/2016 6:05:10 PM By: Montey Hora Entered By: Montey Hora on 03/26/2016 14:32:46 [name] -------------------------------------------------------------------------------- Patient/Caregiver Education Details Patient Name: [name] Date of Service: 03/26/2016 2:15 PM Medical Record Patient Account Number: [id number] Number: Treating RN: Montey Hora 01-31-1951 (65 y.o. Other Clinician: Date of Birth/Gender: Female) Treating ROBSON, MICHAEL Primary Care Physician: LADA, MELINDA Physician/Extender: G Referring Physician: LADA, MELINDA Weeks in Treatment: 50 Education Assessment Education Provided To: Patient Education Topics Provided Venous: Handouts: Other: leg elevation Methods: Demonstration, Explain/Verbal Responses: State content correctly Electronic Signature(s) Signed: 03/26/2016 6:05:10 PM By: Montey Hora Entered By: Montey Hora on 03/26/2016 16:06:56 Tates, [name] -------------------------------------------------------------------------------- Wound Assessment Details Patient Name: [name] Date of Service: 03/26/2016 2:15 PM Medical Record Patient Account Number:  [id number] Number: Treating RN: Montey Hora 1951-01-11 (65 y.o. Other Clinician: Date of Birth/Sex: Female) Treating ROBSON, MICHAEL Primary Care Physician: LADA, MELINDA Physician/Extender: Darnell Level  Referring Physician: LADA, MELINDA Weeks in Treatment: 18 Wound Status Wound Number: 3 Primary Trauma, Other Etiology: Wound Location: Right Lower Leg Wound Open Wounding Event: Trauma Status: Date Acquired: 10/09/2015 Comorbid Cataracts, Chronic Obstructive Weeks Of Treatment: 18 History: Pulmonary Disease (COPD), Sleep Clustered Wound: No Apnea, Hypertension, Osteoarthritis, Neuropathy Photos Wound Measurements Length: (cm) 0.8 % Reduction in Area Width: (cm) 0.4 % Reduction in Volu Depth: (cm) 0.1 Epithelialization: Area: (cm) 0.251 Volume: (cm) 0.025 : 49.3% me: 83.1% Medium (34-66%) Wound Description Classification: Partial Thickness Wound Margin: Flat and Intact Exudate Amount: Large Exudate Type: Serous Exudate Color: amber Wound Bed Granulation Amount: Large (67-100%) Exposed Structure Granulation Quality: Pink Fascia Exposed: No [name], [name] H. ([id number]) Necrotic Amount: Small (1-33%) Fat Layer Exposed: No Necrotic Quality: Adherent Slough Tendon Exposed: No Muscle Exposed: No Joint Exposed: No Bone Exposed: No Limited to Skin Breakdown Periwound Skin Texture Texture Color No Abnormalities Noted: No No Abnormalities Noted: No Callus: No Atrophie Blanche: No Crepitus: No Cyanosis: No Excoriation: No Ecchymosis: No Fluctuance: No Erythema: Yes Friable: No Erythema Location: Circumferential Induration: No Hemosiderin Staining: No Localized Edema: No Mottled: No Rash: No Pallor: No Scarring: No Rubor: No Moisture Temperature / Pain No Abnormalities Noted: No Temperature: No Abnormality Dry / Scaly: No Tenderness on Palpation: Yes Maceration: No Moist: No Wound Preparation Ulcer Cleansing: Other: soap and water, Topical  Anesthetic Applied: Other: lidocaine 4%, Treatment Notes Wound #3 (Right Lower Leg) 1. Cleansed with: Cleanse wound with antibacterial soap and water 4. Dressing Applied: Prisma Ag 5. Secondary Dressing Applied ABD Pad 7. Secured with 4 Layer Compression System - Bilateral Electronic Signature(s) Signed: 03/26/2016 6:05:10 PM By: Montey Hora Entered By: Montey Hora on 03/26/2016 14:45:07 [name] -------------------------------------------------------------------------------- Wound Assessment Details Patient Name: [name] Date of Service: 03/26/2016 2:15 PM Medical Record Patient Account Number: [id number] Number: Treating RN: Montey Hora June 01, 1950 (65 y.o. Other Clinician: Date of Birth/Sex: Female) Treating ROBSON, MICHAEL Primary Care Physician: LADA, MELINDA Physician/Extender: G Referring Physician: LADA, MELINDA Weeks in Treatment: 18 Wound Status Wound Number: 4 Primary Venous Leg Ulcer Etiology: Wound Location: Left Lower Leg - Lateral, Proximal Wound Healed - Epithelialized Status: Wounding Event: Trauma Comorbid Cataracts, Chronic Obstructive Date Acquired: 10/09/2015 History: Pulmonary Disease (COPD), Sleep Weeks Of Treatment: 18 Apnea, Hypertension, Osteoarthritis, Clustered Wound: Yes Neuropathy Photos Wound Measurements Length: (cm) 0 % Reduction i Width: (cm) 0 % Reduction i Depth: (cm) 0 Epithelializa Area: (cm) 0 Tunneling: Volume: (cm) 0 Undermining: n Area: 100% n Volume: 100% tion: Large (67-100%) No No Wound Description Classification: Partial Thickness Wound Margin: Flat and Intact Exudate Amount: None Present Wound Bed Granulation Amount: None Present (0%) Exposed Structure Necrotic Amount: None Present (0%) Fascia Exposed: No Fat Layer Exposed: No Tendon Exposed: No [name], [name] H. ([id number]) Muscle Exposed: No Joint Exposed: No Bone Exposed: No Limited to Skin  Breakdown Periwound Skin Texture Texture Color No Abnormalities Noted: No No Abnormalities Noted: No Callus: No Atrophie Blanche: No Crepitus: No Cyanosis: No Excoriation: Yes Ecchymosis: No Fluctuance: No Erythema: Yes Friable: No Erythema Location: Circumferential Induration: No Hemosiderin Staining: No Localized Edema: No Mottled: No Rash: No Pallor: No Scarring: No Rubor: No Moisture Temperature / Pain No Abnormalities Noted: No Temperature: No Abnormality Dry / Scaly: No Tenderness on Palpation: Yes Maceration: No Moist: Yes Wound Preparation Ulcer Cleansing: Other: soap and water, Topical Anesthetic Applied: None Electronic Signature(s) Signed: 03/26/2016 6:05:10 PM By: Montey Hora Entered By: Montey Hora on 03/26/2016 14:45:42 [name], [name] ([id number]) -------------------------------------------------------------------------------- Wound Assessment Details Patient Name: JAYLI, GUTTERMAN Date of Service: 03/26/2016 2:15 PM Medical Record Patient Account Number: [id number] Number: Treating RN: Montey Hora 10/29/1950 (65 y.o. Other Clinician: Date of Birth/Sex: Female) Treating ROBSON, MICHAEL Primary Care Physician: LADA, MELINDA Physician/Extender: G Referring Physician: LADA, MELINDA Weeks in Treatment: 18 Wound Status Wound Number: 5 Primary Venous Leg Ulcer Etiology: Wound Location: Left Lower Leg - Lateral, Distal Wound Open Wounding Event: Gradually Appeared Status: Date Acquired: 12/21/2015 Comorbid Cataracts, Chronic Obstructive Weeks Of Treatment: 13 History: Pulmonary Disease (COPD), Sleep Clustered Wound: No Apnea, Hypertension, Osteoarthritis, Neuropathy Photos Wound Measurements Length: (cm) 1.6 Width: (cm) 1.1 Depth: (cm) 0.1 Area: (cm) 1.382 Volume: (cm) 0.138 % Reduction in Area: -151.3% % Reduction in Volume: -150.9% Epithelialization: None Tunneling: No Undermining: No Wound  Description Classification: Partial Thickness Wound Margin: Flat and Intact Exudate Amount: Large Exudate Type: Serous Exudate Color: amber Wound Bed Granulation Amount: Large (67-100%) Exposed Structure Granulation Quality: Pink Fascia Exposed: No [name], Adya H. ([id number]) Necrotic Amount: Small (1-33%) Fat Layer Exposed: No Necrotic Quality: Adherent Slough Tendon Exposed: No Muscle Exposed: No Joint Exposed: No Bone Exposed: No Limited to Skin Breakdown Periwound Skin Texture Texture Color No Abnormalities Noted: No No Abnormalities Noted: No Callus: No Atrophie Blanche: No Crepitus: No Cyanosis: No Excoriation: No Ecchymosis: No Fluctuance: No Erythema: Yes Friable: No Erythema Location: Circumferential Induration: No Hemosiderin Staining: No Localized Edema: No Mottled: No Rash: No Pallor: No Scarring: No Rubor: No Moisture Temperature / Pain No Abnormalities Noted: No Temperature: No Abnormality Dry / Scaly: No Tenderness on Palpation: Yes Maceration: No Moist: Yes Wound Preparation Ulcer Cleansing: Other: soap and water, Topical Anesthetic Applied: Other: lidocaine 4%, Treatment Notes Wound #5 (Left, Distal, Lateral Lower Leg) 1. Cleansed with: Cleanse wound with antibacterial soap and water 4. Dressing Applied: Prisma Ag 5. Secondary Dressing Applied ABD Pad 7. Secured with 4 Layer Compression System - Bilateral Electronic Signature(s) Signed: 03/26/2016 6:05:10 PM By: Montey Hora Entered By: Montey Hora on 03/26/2016 14:54:02 Nazaryan, [name] ([id number]) -------------------------------------------------------------------------------- Wound Assessment Details Patient Name: [name] Date of Service: 03/26/2016 2:15 PM Medical Record Patient Account Number: [id number] Number: Treating RN: Montey Hora 10/14/1950 (65 y.o. Other Clinician: Date of Birth/Sex: Female) Treating ROBSON, [place] Primary Care  Physician: LADA, MELINDA Physician/Extender: G Referring Physician: LADA, MELINDA Weeks in Treatment: 18 Wound Status Wound Number: 7 Primary Venous Leg Ulcer Etiology: Wound Location: Left Lower Leg - Medial Wound Healed - Epithelialized Wounding Event: Gradually Appeared Status: Date Acquired: 02/06/2016 Comorbid Cataracts, Chronic Obstructive Weeks Of Treatment: 7 History: Pulmonary Disease (COPD), Sleep Clustered Wound: No Apnea, Hypertension, Osteoarthritis, Neuropathy Photos Wound Measurements Length: (cm) 0 % Reduction in Width: (cm) 0 % Reduction in Depth: (cm) 0 Epithelializati Area: (cm) 0 Tunneling: Volume: (cm) 0 Undermining: Area: 100% Volume: 100% on: Large (67-100%) No No Wound Description Classification: Partial Thickness Wound Margin: Flat and Intact Exudate Amount: None Present Foul Odor After Cleansing: No Wound Bed Granulation Amount: None Present (0%) Exposed Structure Necrotic Amount: None Present (0%) Fascia Exposed: No Fat Layer Exposed: No Tendon Exposed: No Ainsley, [place] H. ([id number]) Muscle Exposed: No Joint Exposed: No Bone Exposed: No Limited to Skin Breakdown Periwound Skin Texture Texture Color No Abnormalities Noted: No No Abnormalities Noted: No Erythema: Yes Moisture Erythema Location: Circumferential No Abnormalities Noted: No Moist: Yes Temperature / Pain Temperature: No Abnormality Tenderness on Palpation: Yes Wound Preparation Ulcer Cleansing: Other: soap and water, Topical Anesthetic Applied: None  Electronic Signature(s) Signed: 03/26/2016 6:05:10 PM By: Montey Hora Entered By: Montey Hora on 03/26/2016 14:54:34 Leder, [name] -------------------------------------------------------------------------------- Vitals Details Patient Name: [name] Date of Service: 03/26/2016 2:15 PM Medical Record Patient Account Number: [id number] Number: Treating RN: Montey Hora March 22, 1951 (65 y.o. Other Clinician: Date of Birth/Sex: Female) Treating ROBSON, [place] Primary Care Physician: LADA, MELINDA Physician/Extender: G Referring Physician: LADA, MELINDA Weeks in Treatment: 18 Vital Signs Time Taken: 14:32 Temperature (F): 98.1 Height (in): 63 Pulse (bpm): 74 Weight (lbs): 270 Respiratory Rate (breaths/min): 20 Body Mass Index (BMI): 47.8 Blood Pressure (mmHg): 134/82 Reference Range: 80 - 120 mg / dl Electronic Signature(s) Signed: 03/26/2016 6:05:10 PM By: Montey Hora Entered By: Montey Hora on 03/26/2016 14:35:58

## 2016-03-28 DIAGNOSIS — R339 Retention of urine, unspecified: Secondary | ICD-10-CM | POA: Diagnosis not present

## 2016-03-28 DIAGNOSIS — G35 Multiple sclerosis: Secondary | ICD-10-CM | POA: Diagnosis not present

## 2016-03-28 DIAGNOSIS — L97219 Non-pressure chronic ulcer of right calf with unspecified severity: Secondary | ICD-10-CM | POA: Diagnosis not present

## 2016-03-28 DIAGNOSIS — Z792 Long term (current) use of antibiotics: Secondary | ICD-10-CM | POA: Diagnosis not present

## 2016-03-28 DIAGNOSIS — J449 Chronic obstructive pulmonary disease, unspecified: Secondary | ICD-10-CM | POA: Diagnosis not present

## 2016-03-28 DIAGNOSIS — I10 Essential (primary) hypertension: Secondary | ICD-10-CM | POA: Diagnosis not present

## 2016-03-28 DIAGNOSIS — I70234 Atherosclerosis of native arteries of right leg with ulceration of heel and midfoot: Secondary | ICD-10-CM | POA: Diagnosis not present

## 2016-03-28 DIAGNOSIS — I70249 Atherosclerosis of native arteries of left leg with ulceration of unspecified site: Secondary | ICD-10-CM | POA: Diagnosis not present

## 2016-03-28 DIAGNOSIS — L03116 Cellulitis of left lower limb: Secondary | ICD-10-CM | POA: Diagnosis not present

## 2016-03-28 DIAGNOSIS — L97929 Non-pressure chronic ulcer of unspecified part of left lower leg with unspecified severity: Secondary | ICD-10-CM | POA: Diagnosis not present

## 2016-03-28 DIAGNOSIS — I70232 Atherosclerosis of native arteries of right leg with ulceration of calf: Secondary | ICD-10-CM | POA: Diagnosis not present

## 2016-03-28 DIAGNOSIS — L97419 Non-pressure chronic ulcer of right heel and midfoot with unspecified severity: Secondary | ICD-10-CM | POA: Diagnosis not present

## 2016-03-29 DIAGNOSIS — G35 Multiple sclerosis: Secondary | ICD-10-CM | POA: Diagnosis not present

## 2016-03-29 DIAGNOSIS — L97419 Non-pressure chronic ulcer of right heel and midfoot with unspecified severity: Secondary | ICD-10-CM | POA: Diagnosis not present

## 2016-03-29 DIAGNOSIS — L97929 Non-pressure chronic ulcer of unspecified part of left lower leg with unspecified severity: Secondary | ICD-10-CM | POA: Diagnosis not present

## 2016-03-29 DIAGNOSIS — Z792 Long term (current) use of antibiotics: Secondary | ICD-10-CM | POA: Diagnosis not present

## 2016-03-29 DIAGNOSIS — I70249 Atherosclerosis of native arteries of left leg with ulceration of unspecified site: Secondary | ICD-10-CM | POA: Diagnosis not present

## 2016-03-29 DIAGNOSIS — J449 Chronic obstructive pulmonary disease, unspecified: Secondary | ICD-10-CM | POA: Diagnosis not present

## 2016-03-29 DIAGNOSIS — L97219 Non-pressure chronic ulcer of right calf with unspecified severity: Secondary | ICD-10-CM | POA: Diagnosis not present

## 2016-03-29 DIAGNOSIS — I10 Essential (primary) hypertension: Secondary | ICD-10-CM | POA: Diagnosis not present

## 2016-03-29 DIAGNOSIS — L03116 Cellulitis of left lower limb: Secondary | ICD-10-CM | POA: Diagnosis not present

## 2016-03-29 DIAGNOSIS — I70234 Atherosclerosis of native arteries of right leg with ulceration of heel and midfoot: Secondary | ICD-10-CM | POA: Diagnosis not present

## 2016-03-29 DIAGNOSIS — I70232 Atherosclerosis of native arteries of right leg with ulceration of calf: Secondary | ICD-10-CM | POA: Diagnosis not present

## 2016-03-29 DIAGNOSIS — R339 Retention of urine, unspecified: Secondary | ICD-10-CM | POA: Diagnosis not present

## 2016-04-01 DIAGNOSIS — L97219 Non-pressure chronic ulcer of right calf with unspecified severity: Secondary | ICD-10-CM | POA: Diagnosis not present

## 2016-04-01 DIAGNOSIS — J449 Chronic obstructive pulmonary disease, unspecified: Secondary | ICD-10-CM | POA: Diagnosis not present

## 2016-04-01 DIAGNOSIS — I10 Essential (primary) hypertension: Secondary | ICD-10-CM | POA: Diagnosis not present

## 2016-04-01 DIAGNOSIS — G35 Multiple sclerosis: Secondary | ICD-10-CM | POA: Diagnosis not present

## 2016-04-01 DIAGNOSIS — L97929 Non-pressure chronic ulcer of unspecified part of left lower leg with unspecified severity: Secondary | ICD-10-CM | POA: Diagnosis not present

## 2016-04-01 DIAGNOSIS — I70234 Atherosclerosis of native arteries of right leg with ulceration of heel and midfoot: Secondary | ICD-10-CM | POA: Diagnosis not present

## 2016-04-01 DIAGNOSIS — L97419 Non-pressure chronic ulcer of right heel and midfoot with unspecified severity: Secondary | ICD-10-CM | POA: Diagnosis not present

## 2016-04-01 DIAGNOSIS — I70249 Atherosclerosis of native arteries of left leg with ulceration of unspecified site: Secondary | ICD-10-CM | POA: Diagnosis not present

## 2016-04-01 DIAGNOSIS — Z792 Long term (current) use of antibiotics: Secondary | ICD-10-CM | POA: Diagnosis not present

## 2016-04-01 DIAGNOSIS — L03116 Cellulitis of left lower limb: Secondary | ICD-10-CM | POA: Diagnosis not present

## 2016-04-01 DIAGNOSIS — I70232 Atherosclerosis of native arteries of right leg with ulceration of calf: Secondary | ICD-10-CM | POA: Diagnosis not present

## 2016-04-01 DIAGNOSIS — R339 Retention of urine, unspecified: Secondary | ICD-10-CM | POA: Diagnosis not present

## 2016-04-02 ENCOUNTER — Telehealth: Payer: Self-pay | Admitting: Family Medicine

## 2016-04-02 NOTE — Telephone Encounter (Signed)
Ok done

## 2016-04-02 NOTE — Telephone Encounter (Signed)
Absolutely! Thank them for going and let us know how we can help

## 2016-04-02 NOTE — Telephone Encounter (Signed)
[name] from [facility] is requesting an order for medical social work to come into the home to assess the home. [name] states she does not feel the patient is safe there alone due to multiple falls and the patient has MS. The fire department has had to go to the home several times to help her get up when she has fallen (P) [phone]

## 2016-04-03 ENCOUNTER — Encounter: Payer: Medicare Other | Attending: Internal Medicine | Admitting: Internal Medicine

## 2016-04-03 DIAGNOSIS — F17218 Nicotine dependence, cigarettes, with other nicotine-induced disorders: Secondary | ICD-10-CM | POA: Diagnosis not present

## 2016-04-03 DIAGNOSIS — R590 Localized enlarged lymph nodes: Secondary | ICD-10-CM | POA: Diagnosis not present

## 2016-04-03 DIAGNOSIS — R32 Unspecified urinary incontinence: Secondary | ICD-10-CM | POA: Diagnosis not present

## 2016-04-03 DIAGNOSIS — L97222 Non-pressure chronic ulcer of left calf with fat layer exposed: Secondary | ICD-10-CM | POA: Insufficient documentation

## 2016-04-03 DIAGNOSIS — L97512 Non-pressure chronic ulcer of other part of right foot with fat layer exposed: Secondary | ICD-10-CM | POA: Insufficient documentation

## 2016-04-03 DIAGNOSIS — Z6841 Body Mass Index (BMI) 40.0 and over, adult: Secondary | ICD-10-CM | POA: Diagnosis not present

## 2016-04-03 DIAGNOSIS — L043 Acute lymphadenitis of lower limb: Secondary | ICD-10-CM | POA: Insufficient documentation

## 2016-04-03 DIAGNOSIS — J449 Chronic obstructive pulmonary disease, unspecified: Secondary | ICD-10-CM | POA: Diagnosis not present

## 2016-04-03 DIAGNOSIS — F419 Anxiety disorder, unspecified: Secondary | ICD-10-CM | POA: Insufficient documentation

## 2016-04-03 DIAGNOSIS — I10 Essential (primary) hypertension: Secondary | ICD-10-CM | POA: Insufficient documentation

## 2016-04-03 DIAGNOSIS — Z79899 Other long term (current) drug therapy: Secondary | ICD-10-CM | POA: Diagnosis not present

## 2016-04-03 DIAGNOSIS — G4733 Obstructive sleep apnea (adult) (pediatric): Secondary | ICD-10-CM | POA: Insufficient documentation

## 2016-04-03 DIAGNOSIS — L97221 Non-pressure chronic ulcer of left calf limited to breakdown of skin: Secondary | ICD-10-CM | POA: Diagnosis not present

## 2016-04-03 DIAGNOSIS — G35 Multiple sclerosis: Secondary | ICD-10-CM | POA: Diagnosis not present

## 2016-04-03 DIAGNOSIS — I739 Peripheral vascular disease, unspecified: Secondary | ICD-10-CM | POA: Diagnosis not present

## 2016-04-03 DIAGNOSIS — L97212 Non-pressure chronic ulcer of right calf with fat layer exposed: Secondary | ICD-10-CM | POA: Diagnosis not present

## 2016-04-03 DIAGNOSIS — L97211 Non-pressure chronic ulcer of right calf limited to breakdown of skin: Secondary | ICD-10-CM | POA: Diagnosis not present

## 2016-04-04 NOTE — Progress Notes (Addendum)
[name], [name] ([id number]) Visit Report for 04/03/2016 Chief Complaint Document Details Patient Name: [name], [name] Date of Service: 04/03/2016 2:15 PM Medical Record Patient Account Number: [id number] Number: Treating RN: Montey Hora Sep 21, 1950 (65 y.o. Other Clinician: Date of Birth/Sex: Female) Treating Kaili Castille Primary Care Physician/Extender: Peri Jefferson, [place] Physician: Referring Physician: LADA, MELINDA Weeks in Treatment: 6 Information Obtained from: Patient Chief Complaint returns for evaluation of bilateral lower extremity ulcerations Electronic Signature(s) Signed: 04/03/2016 4:39:43 PM By: Linton Ham MD Entered By: Linton Ham on 04/03/2016 15:01:14 [name] ([id number]) -------------------------------------------------------------------------------- HPI Details Patient Name: [name] Date of Service: 04/03/2016 2:15 PM Medical Record Patient Account Number: [id number] Number: Treating RN: Montey Hora 1950-08-27 (65 y.o. Other Clinician: Date of Birth/Sex: Female) Treating Naksh Radi Primary Care Physician/Extender: Peri Jefferson, [place] Physician: Referring Physician: LADA, MELINDA Weeks in Treatment: 41 History of Present Illness Location: left and right lower extremity and dorsum of the right foot Quality: Patient reports experiencing a sharp pain to affected area(s). Severity: Patient rates the pain to be a 8 out of 10 especially with palpation Duration: Patient has had the wound for > 6 months prior to seeking treatment at the wound center Timing: Pain in wound is Intermittent in severity but persistent Context: The wound appeared gradually over time Modifying Factors: Other treatment(s) tried include: symptomatic treatment as advised by her PCP Associated Signs and Symptoms: Patient reports having increase swelling iin her bilateral lower extremities HPI Description: 65 year old with a past medical  history significant for MS, urinary incontinence, and obesity. She has been seen in the wound clinic before for lower extremity ulcerations treated with compression therapy. she is also known to have hypertension, peripheral vascular disease, COPD, obstructive sleep apnea, lumbar radiculopathy, kyphoscoliosis, urinary issues and tobacco abuse. Smokes a packet of cigarettes a day was recently seen at the [facility] for swelling of her legs and feet with a ulceration on the dorsum of the right foot which has been there for about 6 months. she was recently in the ER about a month ago where she was seen for shortness of breath and swelling of the legs and a chest x-ray was within normal limits had an increase in her BNP and was given Lasix and put on doxycycline for a mild cellulitis and possible UTI. Wounds aresmaller today 11/13/15. Debrided and will continue Santyl. 12/21/2015 -- she was admitted to the hospital overnight on 12/18/2015 and diagnosed with urinary retention and cellulitis of the left lower leg. is past to take clindamycin and use Santyl for her wounds. 01/15/2016 -- come back to see [place] for almost a month and continues to be noncompliant with her dressings 01/30/16 patient presents today for a follow-up visit concerning her ongoing bilateral lower extremity wounds. We have not seen her for the past 2 weeks although we are supposed to be seen her for weekly visits. She currently has a Foley catheter. She tells me that the wounds are intensely painful especially with pressure and palpation at this point in time. Fortunately she is having no interval signs or symptoms of systemic infection but unfortunately the wounds have not improved dramatically since we last saw her. She does have home health coming to take care of her as well. She is currently not in any compression wraps. 02/06/16 ON evaluation today patient continues to experience discomfort regarding her bilateral  lower extremity ulcers. She has continued to tolerate the dressing changes at this point in time and continues  to have a Foley catheter as well. Fortunately she is back this week in the past she has been somewhat noncompliant with follow-ups I'm glad to see her today. She tells me that her pain level varies but can be as Mewborn, Gayle H. ([id number]) high as a 7 out of 10 with manipulation of the wound. She tells me that she used to be on oxycodone which was managed by the pain clinic although she is no longer on that as she tells me that she was actually smoking marijuana at the time and when this was found out they discontinued her pain medication. She no longer is taking anything pain medication wise and she also does not smoke cigarettes nor marijuana at this point. 02/12/16; this is a patient I don't believe I have previously seen. She has multiple sclerosis. She has 4 punched-out areas on the anterior lateral left leg 2 areas on the right these are all in the same condition. Reasonably small [dime size] wounds each was some depth. Surface of these wounds does not look particularly healthy as there is adherent slough. There is no evidence of surrounding infection or inflammation. ABIs in this clinic were 0.87 on the right and 0.81 on the left. She is listed as having PAD and is a smoker. Not a diabetic 02/20/16 patient I gave antibiotics to last week for erythema around both wound sites on the left lateral leg and right lateral leg. This appears to be a lot better. One of the areas on the left leg is healed however she still has 3 punched-out open areas on the left lateral calf and one on the right lateral calf and one on the dorsal foot. She is an ex-smoker quitting 1 month ago. ABIs in this clinic were 0.87 on the right 0.81 on the left 03/12/16; this is a patient I really don't have a good sense of. It would appear for the first 5 or 6 weeks of her stay in this clinic she was cared for by  Dr. Con Memos. She appeared on my schedule in mid October and I saw her twice. She has not been here however in 3 weeks. She has advanced Wound Care at home where she lives with her husband. She has multiple sclerosis. I saw her the first time she had 4 punched-out small wounds on the anterior lateral left leg it appears that she is now down to 2. She also had wounds on the lateral and medial aspect of her right leg which were also small and punched out. The only one that remains is on the right lateral. Because of the nature of her wound I went ahead and ordered formal arterial studies this showed an ABI of 1 on the right and one on the left. TBI of 1.4 on the right and 0.79 on the left. She had normal waveforms. She was in the emergency room on 02/28/16; there is a noted edema of her left leg after a fall. She had a duplex ultrasound that showed no evidence for an acute DVT from the left groin to the popliteal fossa. The study was limited in the calf veins due to edema. Also noticeable that she had a left inguinal enlarged lymph node up to 6.2 cm. She also had a left knee x-ray that showed no acute findings and a right foot x-ray that showed soft tissue swelling but no radiographic evidence of osteomyelitis. The patient does stop smoking 03-19-16 Ms Willcutt presents today for evaluation of her bilateral lower  extremity ulcerations. She states that she has not smoked in several weeks. She denies any pain or discomfort to bilateral lower extremities, tolerating compression therapy as ordered. 03/26/16; I have not seen this patient in 2 weeks however she has done very well with improvements in the areas in question. After we obtain normal arterial studies, increasing the 4-layer compression really seems to look done a nice job here. She has one open area on the left lateral leg and one on the medial right leg. These have filled in nicely and are now superficial wounds that showed epithelialized. I note  the left inguinal lymph node at 6.2 cm. I may need to refer this back to the patient's primary doctor. 04/03/16; patient has 2 remaining wounds 1 on the left lateral leg and one on the right medial leg. Both of filled in nicely since we are able to increase her from a 3 layer to a 4 layer compression. I am also following up on the lymph node notable on her left leg DVT rule out in November Electronic Signature(s) Signed: 04/03/2016 4:39:43 PM By: Linton Ham MD Entered By: Linton Ham on 04/03/2016 15:02:08 [name] ([id number]) -------------------------------------------------------------------------------- Physical Exam Details Patient Name: [name] Date of Service: 04/03/2016 2:15 PM Medical Record Patient Account Number: [id number] Number: Treating RN: Montey Hora [birth year] (65 y.o. Other Clinician: Date of Birth/Sex: Female) Treating Altamese Deguire Primary Care Physician/Extender: Peri Jefferson, [place] Physician: Referring Physician: LADA, MELINDA Weeks in Treatment: 19 Constitutional Sitting or standing Blood Pressure is within target range for patient.. Pulse regular and within target range for patient.Marland Kitchen Respirations regular, non-labored and within target range.. Temperature is normal and within the target range for the patient.. Patient's appearance is neat and clean. Appears in no acute distress. Well nourished and well developed.. Eyes Conjunctivae clear. No discharge.. Neck Neck supple and symmetrical. No masses or crepitus. Respiratory Respiratory effort is easy and symmetric bilaterally. Rate is normal at rest and on room air.. Cardiovascular Pedal pulses palpable and strong bilaterally.. Gastrointestinal (GI) Abdomen is soft and non-distended without masses or tenderness. Bowel sounds active in all quadrants.. No liver or spleen enlargement or tenderness.. Genitourinary (GU) Chronic Foley catheter. Lymphatic No lymphadenopathy or  glandular swelling noted in neck.. No lymphadenopathy or glandular swelling noted in axillae.. No lymphadenopathy or glandular swelling noted in groin.. Integumentary (Hair, Skin) Skin and subcutaneous tissue without rashes, lesions, discoloration or ulcers. No evidence of fungal disease. Hair and skin color texture normal and healthy in appearance.Marland Kitchen Psychiatric No evidence of depression, anxiety, or agitation. Calm, cooperative, and communicative. Appropriate interactions and affect.. Notes Wound exam; the patient has a remaining wound on the left lateral and right medial calf. The area on the right medial calf appears to have early epithelialization and appears well on its way to healing. The area on the left lateral calf is not nearly as robust as the right although it does have healthy granulation and appears to be also improving. There is no evidence of surrounding infection here. STEVY, ROMIG ([id number]) Electronic Signature(s) Signed: 04/03/2016 4:39:43 PM By: Linton Ham MD Entered By: Linton Ham on 04/03/2016 15:04:46 [name] ([id number]) -------------------------------------------------------------------------------- Physician Orders Details Patient Name: [name] Date of Service: 04/03/2016 2:15 PM Medical Record Patient Account Number: [id number] Number: Treating RN: Montey Hora 28-Jun-1950 (65 y.o. Other Clinician: Date of Birth/Sex: Female) Treating Weber Monnier Primary Care Physician/Extender: Peri Jefferson, [place] Physician: Referring Physician: LADA, MELINDA Weeks in Treatment: 45 Verbal /  Phone Orders: Yes Clinician: Montey Hora Read Back and Verified: Yes Diagnosis Coding Wound Cleansing Wound #3 Right Lower Leg o Cleanse wound with mild soap and water - HHRN to wash legs with soap and water o No tub bath. Wound #5 Left,Distal,Lateral Lower Leg o Cleanse wound with mild soap and water - HHRN to wash legs with soap  and water o No tub bath. Anesthetic Wound #3 Right Lower Leg o Topical Lidocaine 4% cream applied to wound bed prior to debridement - in clinic only Wound #5 Left,Distal,Lateral Lower Leg o Topical Lidocaine 4% cream applied to wound bed prior to debridement - in clinic only Primary Wound Dressing Wound #3 Right Lower Leg o Prisma Ag - or collagen with silver equivalent - silver alginate is not collagen Wound #5 Left,Distal,Lateral Lower Leg o Prisma Ag - or collagen with silver equivalent - silver alginate is not collagen Secondary Dressing Wound #3 Right Lower Leg o ABD pad o Dry Gauze Wound #5 Left,Distal,Lateral Lower Leg o ABD pad o Dry Gauze Dressing Change Frequency Waidelich, Findley H. ([id number]) Wound #3 Right Lower Leg o Three times weekly - [facility] to change Monday and Friday Pt comes to clinic Wednesday Wound #5 Left,Distal,Lateral Lower Leg o Three times weekly - [facility] to change Monday and Friday Pt comes to clinic Wednesday Follow-up Appointments Wound #3 Right Lower Leg o Return Appointment in 1 week. Wound #5 Left,Distal,Lateral Lower Leg o Return Appointment in 1 week. Edema Control Wound #3 Right Lower Leg o 4 Layer Compression System - Bilateral - use 1 layer of unna wrap to anchor at the top o Patient to wear own compression stockings - HHRN please order compression hose for patient 20-64mmHg o Elevate legs to the level of the heart and pump ankles as often as possible Wound #5 Left,Distal,Lateral Lower Leg o 4 Layer Compression System - Bilateral - use 1 layer of unna wrap to anchor at the top o Patient to wear own compression stockings - HHRN please order compression hose for patient 20-56mmHg o Elevate legs to the level of the heart and pump ankles as often as possible Additional Orders / Instructions Wound #3 Right Lower Leg o Stop Smoking o Increase protein intake. Wound #5 Left,Distal,Lateral Lower Leg o Stop  Smoking o Increase protein intake. Home Health Wound #3 Right Lower Leg o [place] [facility] - [facility]---[facility] to change wraps Monday and Friday *********[facility] please order compression hose for patient 20-64mmHg********* o Home Health Nurse may visit PRN to address patientos wound care needs. o FACE TO FACE ENCOUNTER: MEDICARE and MEDICAID PATIENTS: I certify that this patient is under my care and that I had a face-to-face encounter that meets the physician face-to-face encounter requirements with this patient on this date. The encounter with the patient was in whole or in part for the following MEDICAL CONDITION: (primary reason for [place]) MEDICAL NECESSITY: I certify, that based on my findings, NURSING services are a medically necessary home health service. HOME BOUND STATUS: I certify that my clinical findings ALAIYA, BINNER. ([id number]) support that this patient is homebound (i.e., Due to illness or injury, pt requires aid of supportive devices such as crutches, cane, wheelchairs, walkers, the use of special transportation or the assistance of another person to leave their place of residence. There is a normal inability to leave the home and doing so requires considerable and taxing effort. Other absences are for medical reasons / religious services and are infrequent or of short duration when  for other reasons). o If current dressing causes regression in wound condition, may D/C ordered dressing product/s and apply Normal Saline Moist Dressing daily until next [place] / Other MD appointment. Isabel of regression in wound condition at [phone]. o Please direct any NON-WOUND related issues/requests for orders to patient's Primary Care Physician Wound #5 Left,Distal,Lateral Lower Leg o [place] [facility] - [facility]---[facility] to change wraps Monday and Friday *********[facility] please order compression hose for patient  20-20mmHg********* o Home Health Nurse may visit PRN to address patientos wound care needs. o FACE TO FACE ENCOUNTER: MEDICARE and MEDICAID PATIENTS: I certify that this patient is under my care and that I had a face-to-face encounter that meets the physician face-to-face encounter requirements with this patient on this date. The encounter with the patient was in whole or in part for the following MEDICAL CONDITION: (primary reason for [place]) MEDICAL NECESSITY: I certify, that based on my findings, NURSING services are a medically necessary home health service. HOME BOUND STATUS: I certify that my clinical findings support that this patient is homebound (i.e., Due to illness or injury, pt requires aid of supportive devices such as crutches, cane, wheelchairs, walkers, the use of special transportation or the assistance of another person to leave their place of residence. There is a normal inability to leave the home and doing so requires considerable and taxing effort. Other absences are for medical reasons / religious services and are infrequent or of short duration when for other reasons). o If current dressing causes regression in wound condition, may D/C ordered dressing product/s and apply Normal Saline Moist Dressing daily until next [place] / Other MD appointment. [place] of regression in wound condition at [phone]. o Please direct any NON-WOUND related issues/requests for orders to patient's Primary Care Physician Electronic Signature(s) Signed: 04/03/2016 4:39:21 PM By: Montey Hora Signed: 04/03/2016 4:39:43 PM By: Linton Ham MD Entered By: Montey Hora on 04/03/2016 14:47:37 [name] -------------------------------------------------------------------------------- Problem List Details Patient Name: [name] Date of Service: 04/03/2016 2:15 PM Medical Record Patient Account Number:  [id number] Number: Treating RN: Montey Hora 16-Feb-1951 (65 y.o. 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 11/20/2015 Yes exposed L97.512 Non-pressure chronic ulcer of other part of right foot with 11/20/2015 Yes fat layer exposed L97.222 Non-pressure chronic ulcer of left calf with fat layer 11/20/2015 Yes exposed G35 Multiple sclerosis 11/20/2015 Yes E66.01 Morbid (severe) obesity due to excess calories 11/20/2015 Yes F17.218 Nicotine dependence, cigarettes, with other nicotine- 11/20/2015 Yes induced disorders L04.3 Acute lymphadenitis of lower limb 04/03/2016 Yes Inactive Problems Resolved Problems Electronic Signature(s) MAKHAYLA, HOVDE ([id number]) Signed: 04/03/2016 4:39:43 PM By: Linton Ham MD Entered By: Linton Ham on 04/03/2016 15:07:12 [name] -------------------------------------------------------------------------------- Progress Note Details Patient Name: [name] Date of Service: 04/03/2016 2:15 PM Medical Record Patient Account Number: [id number] Number: Treating RN: Montey Hora 1950-06-14 (65 y.o. Other Clinician: Date of Birth/Sex: Female) Treating Takai Chiaramonte Primary Care Physician/Extender: Peri Jefferson, [place] Physician: Referring Physician: LADA, MELINDA Weeks in Treatment: 29 Subjective Chief Complaint Information obtained from Patient returns for evaluation of bilateral lower extremity ulcerations History of Present Illness (HPI) The following HPI elements were documented for the patient's wound: Location: left  and right lower extremity and dorsum of the right foot Quality: Patient reports experiencing a sharp pain  to affected area(s). Severity: Patient rates the pain to be a 8 out of 10 especially with palpation Duration: Patient has had the wound for > 6 months prior to seeking treatment at the wound center Timing: Pain in wound is Intermittent in severity but persistent Context: The wound appeared gradually over time Modifying Factors: Other treatment(s) tried include: symptomatic treatment as advised by her PCP Associated Signs and Symptoms: Patient reports having increase swelling iin her bilateral lower extremities 65 year old with a past medical history significant for MS, urinary incontinence, and obesity. She has been seen in the wound clinic before for lower extremity ulcerations treated with compression therapy. she is also known to have hypertension, peripheral vascular disease, COPD, obstructive sleep apnea, lumbar radiculopathy, kyphoscoliosis, urinary issues and tobacco abuse. Smokes a packet of cigarettes a day was recently seen at the [facility] for swelling of her legs and feet with a ulceration on the dorsum of the right foot which has been there for about 6 months. she was recently in the ER about a month ago where she was seen for shortness of breath and swelling of the legs and a chest x-ray was within normal limits had an increase in her BNP and was given Lasix and put on doxycycline for a mild cellulitis and possible UTI. Wounds aresmaller today 11/13/15. Debrided and will continue Santyl. 12/21/2015 -- she was admitted to the hospital overnight on 12/18/2015 and diagnosed with urinary retention and cellulitis of the left lower leg. is past to take clindamycin and use Santyl for her wounds. 01/15/2016 -- come back to see [place] for almost a month and continues to be noncompliant with her dressings 01/30/16 patient presents today for a follow-up visit concerning her ongoing bilateral lower extremity wounds. We have not seen her for the past 2 weeks although we are supposed  to be seen her for weekly visits. She currently has a Foley catheter. She tells me that the wounds are intensely painful especially with pressure and palpation at this point in time. Fortunately she is having no interval signs or symptoms of systemic infection but unfortunately the wounds have not improved dramatically since we last saw her. She does Meyers, Dorla H. ([id number]) have home health coming to take care of her as well. She is currently not in any compression wraps. 02/06/16 ON evaluation today patient continues to experience discomfort regarding her bilateral lower extremity ulcers. She has continued to tolerate the dressing changes at this point in time and continues to have a Foley catheter as well. Fortunately she is back this week in the past she has been somewhat noncompliant with follow-ups I'm glad to see her today. She tells me that her pain level varies but can be as high as a 7 out of 10 with manipulation of the wound. She tells me that she used to be on oxycodone which was managed by the pain clinic although she is no longer on that as she tells me that she was actually smoking marijuana at the time and when this was found out they discontinued her pain medication. She no longer is taking anything pain medication wise and she also does not smoke cigarettes nor marijuana at this point. 02/12/16; this is a patient I don't believe I have previously seen. She has multiple sclerosis. She has 4 punched-out areas on the anterior lateral left leg 2 areas on the right  these are all in the same condition. Reasonably small [dime size] wounds each was some depth. Surface of these wounds does not look particularly healthy as there is adherent slough. There is no evidence of surrounding infection or inflammation. ABIs in this clinic were 0.87 on the right and 0.81 on the left. She is listed as having PAD and is a smoker. Not a diabetic 02/20/16 patient I gave antibiotics to last week for  erythema around both wound sites on the left lateral leg and right lateral leg. This appears to be a lot better. One of the areas on the left leg is healed however she still has 3 punched-out open areas on the left lateral calf and one on the right lateral calf and one on the dorsal foot. She is an ex-smoker quitting 1 month ago. ABIs in this clinic were 0.87 on the right 0.81 on the left 03/12/16; this is a patient I really don't have a good sense of. It would appear for the first 5 or 6 weeks of her stay in this clinic she was cared for by Dr. Con Memos. She appeared on my schedule in mid October and I saw her twice. She has not been here however in 3 weeks. She has advanced Wound Care at home where she lives with her husband. She has multiple sclerosis. I saw her the first time she had 4 punched-out small wounds on the anterior lateral left leg it appears that she is now down to 2. She also had wounds on the lateral and medial aspect of her right leg which were also small and punched out. The only one that remains is on the right lateral. Because of the nature of her wound I went ahead and ordered formal arterial studies this showed an ABI of 1 on the right and one on the left. TBI of 1.4 on the right and 0.79 on the left. She had normal waveforms. She was in the emergency room on 02/28/16; there is a noted edema of her left leg after a fall. She had a duplex ultrasound that showed no evidence for an acute DVT from the left groin to the popliteal fossa. The study was limited in the calf veins due to edema. Also noticeable that she had a left inguinal enlarged lymph node up to 6.2 cm. She also had a left knee x-ray that showed no acute findings and a right foot x-ray that showed soft tissue swelling but no radiographic evidence of osteomyelitis. The patient does stop smoking 03-19-16 Ms Larance presents today for evaluation of her bilateral lower extremity ulcerations. She states that she has not  smoked in several weeks. She denies any pain or discomfort to bilateral lower extremities, tolerating compression therapy as ordered. 03/26/16; I have not seen this patient in 2 weeks however she has done very well with improvements in the areas in question. After we obtain normal arterial studies, increasing the 4-layer compression really seems to look done a nice job here. She has one open area on the left lateral leg and one on the medial right leg. These have filled in nicely and are now superficial wounds that showed epithelialized. I note the left inguinal lymph node at 6.2 cm. I may need to refer this back to the patient's primary doctor. 04/03/16; patient has 2 remaining wounds 1 on the left lateral leg and one on the right medial leg. Both of filled in nicely since we are able to increase her from a 3 layer to  a 4 layer compression. I am also following up on the lymph node notable on her left leg DVT rule out in November Schoonmaker, Nami H. ([id number]) Objective Constitutional Sitting or standing Blood Pressure is within target range for patient.. Pulse regular and within target range for patient.Marland Kitchen Respirations regular, non-labored and within target range.. Temperature is normal and within the target range for the patient.. Patient's appearance is neat and clean. Appears in no acute distress. Well nourished and well developed.. Vitals Time Taken: 2:32 PM, Height: 63 in, Weight: 270 lbs, BMI: 47.8, Pulse: 79 bpm, Respiratory Rate: 20 breaths/min, Blood Pressure: 113/55 mmHg. Eyes Conjunctivae clear. No discharge.. Neck Neck supple and symmetrical. No masses or crepitus. Respiratory Respiratory effort is easy and symmetric bilaterally. Rate is normal at rest and on room air.. Cardiovascular Pedal pulses palpable and strong bilaterally.. Gastrointestinal (GI) Abdomen is soft and non-distended without masses or tenderness. Bowel sounds active in all quadrants.. No liver or spleen  enlargement or tenderness.. Genitourinary (GU) Chronic Foley catheter. Lymphatic No lymphadenopathy or glandular swelling noted in neck.. No lymphadenopathy or glandular swelling noted in axillae.. No lymphadenopathy or glandular swelling noted in groin.Marland Kitchen Psychiatric No evidence of depression, anxiety, or agitation. Calm, cooperative, and communicative. Appropriate interactions and affect.. General Notes: Wound exam; the patient has a remaining wound on the left lateral and right medial calf. The area on the right medial calf appears to have early epithelialization and appears well on its way to healing. The area on the left lateral calf is not nearly as robust as the right although it does have healthy granulation and appears to be also improving. There is no evidence of surrounding infection here. Integumentary (Hair, Skin) Skin and subcutaneous tissue without rashes, lesions, discoloration or ulcers. No evidence of fungal Kienast, Tyishia H. ([id number]) disease. Hair and skin color texture normal and healthy in appearance.. Wound #3 status is Open. Original cause of wound was Trauma. The wound is located on the Right Lower Leg. The wound measures 0.6cm length x 0.3cm width x 0.1cm depth; 0.141cm^2 area and 0.014cm^3 volume. The wound is limited to skin breakdown. There is no tunneling or undermining noted. There is a large amount of serous drainage noted. The wound margin is flat and intact. There is large (67-100%) pink granulation within the wound bed. There is a small (1-33%) amount of necrotic tissue within the wound bed including Adherent Slough. The periwound skin appearance exhibited: Erythema. The periwound skin appearance did not exhibit: Callus, Crepitus, Excoriation, Fluctuance, Friable, Induration, Localized Edema, Rash, Scarring, Dry/Scaly, Maceration, Moist, Atrophie Blanche, Cyanosis, Ecchymosis, Hemosiderin Staining, Mottled, Pallor, Rubor. The surrounding wound skin color is  noted with erythema which is circumferential. Periwound temperature was noted as No Abnormality. The periwound has tenderness on palpation. Wound #5 status is Open. Original cause of wound was Gradually Appeared. The wound is located on the Left,Distal,Lateral Lower Leg. The wound measures 1.2cm length x 1cm width x 0.1cm depth; 0.942cm^2 area and 0.094cm^3 volume. The wound is limited to skin breakdown. There is no tunneling or undermining noted. There is a large amount of serous drainage noted. The wound margin is flat and intact. There is large (67-100%) pink granulation within the wound bed. There is a small (1-33%) amount of necrotic tissue within the wound bed including Adherent Slough. The periwound skin appearance exhibited: Moist, Erythema. The periwound skin appearance did not exhibit: Callus, Crepitus, Excoriation, Fluctuance, Friable, Induration, Localized Edema, Rash, Scarring, Dry/Scaly, Maceration, Atrophie Blanche, Cyanosis, Ecchymosis, Hemosiderin Staining, Mottled, Pallor,  Rubor. The surrounding wound skin color is noted with erythema which is circumferential. Periwound temperature was noted as No Abnormality. The periwound has tenderness on palpation. 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 ([id number]) Wound #3 Right Lower Leg: Cleanse wound with mild soap and water - HHRN to wash legs with soap and water No tub bath. Wound #5 Left,Distal,Lateral Lower Leg: Cleanse wound with mild soap and water - HHRN to  wash legs with soap and water No tub bath. Anesthetic: Wound #3 Right Lower Leg: Topical Lidocaine 4% cream applied to wound bed prior to debridement - in clinic only Wound #5 Left,Distal,Lateral Lower Leg: Topical Lidocaine 4% cream applied to wound bed prior to debridement - in clinic only Primary Wound Dressing: Wound #3 Right Lower Leg: Prisma Ag - or collagen with silver equivalent - silver alginate is not collagen Wound #5 Left,Distal,Lateral Lower Leg: Prisma Ag - or collagen with silver equivalent - silver alginate is not collagen Secondary Dressing: Wound #3 Right Lower Leg: ABD pad Dry Gauze Wound #5 Left,Distal,Lateral Lower Leg: ABD pad Dry Gauze Dressing Change Frequency: Wound #3 Right Lower Leg: Three times weekly - [facility] to change Monday and Friday Pt comes to clinic Wednesday Wound #5 Left,Distal,Lateral Lower Leg: Three times weekly - [facility] to change Monday and Friday Pt comes to clinic Wednesday Follow-up Appointments: Wound #3 Right Lower Leg: Return Appointment in 1 week. Wound #5 Left,Distal,Lateral Lower Leg: Return Appointment in 1 week. Edema Control: Wound #3 Right Lower Leg: 4 Layer Compression System - Bilateral - use 1 layer of unna wrap to anchor at the top Patient to wear own compression stockings - HHRN please order compression hose for patient 20- 35mmHg Elevate legs to the level of the heart and pump ankles as often as possible Wound #5 Left,Distal,Lateral Lower Leg: 4 Layer Compression System - Bilateral - use 1 layer of unna wrap to anchor at the top Patient to wear own compression stockings - HHRN please order compression hose for patient 20- 5mmHg Elevate legs to the level of the heart and pump ankles as often as possible Additional Orders / Instructions: Wound #3 Right Lower Leg: Stop Smoking Increase protein intake. Wound #5 Left,Distal,Lateral Lower Leg: Stop Smoking Increase protein intake. AYLANI, KRUPICKA ([id number]) Home  Health: Wound #3 Right Lower Leg: Continue Home Health Visits - [facility] to change wraps Monday and Friday *********[facility] please order compression hose for patient 20-58mmHg********* Home Health Nurse may visit PRN to address patient s wound care needs. FACE TO FACE ENCOUNTER: MEDICARE and MEDICAID PATIENTS: I certify that this patient is under my care and that I had a face-to-face encounter that meets the physician face-to-face encounter requirements with this patient on this date. The encounter with the patient was in whole or in part for the following MEDICAL CONDITION: (primary reason for [place]) MEDICAL NECESSITY: I certify, that based on my findings, NURSING services are a medically necessary home health service. HOME BOUND STATUS: I certify that my clinical findings support that this patient is homebound (i.e., Due to illness or injury, pt requires aid  of supportive devices such as crutches, cane, wheelchairs, walkers, the use of special transportation or the assistance of another person to leave their place of residence. There is a normal inability to leave the home and doing so requires considerable and taxing effort. Other absences are for medical reasons / religious services and are infrequent or of short duration when for other reasons). If current dressing causes regression in wound condition, may D/C ordered dressing product/s and apply Normal Saline Moist Dressing daily until next [place] / Other MD appointment. [place] of regression in wound condition at [phone]. Please direct any NON-WOUND related issues/requests for orders to patient's Primary Care Physician Wound #5 Left,Distal,Lateral Lower Leg: [place] [facility]its - [facility] to change wraps Monday and Friday *********[facility] please order compression hose for patient 20-53mmHg********* Home Health Nurse may visit PRN to address patient s wound care needs. FACE  TO FACE ENCOUNTER: MEDICARE and MEDICAID PATIENTS: I certify that this patient is under my care and that I had a face-to-face encounter that meets the physician face-to-face encounter requirements with this patient on this date. The encounter with the patient was in whole or in part for the following MEDICAL CONDITION: (primary reason for [place]) MEDICAL NECESSITY: I certify, that based on my findings, NURSING services are a medically necessary home health service. HOME BOUND STATUS: I certify that my clinical findings support that this patient is homebound (i.e., Due to illness or injury, pt requires aid of supportive devices such as crutches, cane, wheelchairs, walkers, the use of special transportation or the assistance of another person to leave their place of residence. There is a normal inability to leave the home and doing so requires considerable and taxing effort. Other absences are for medical reasons / religious services and are infrequent or of short duration when for other reasons). If current dressing causes regression in wound condition, may D/C ordered dressing product/s and apply Normal Saline Moist Dressing daily until next [place] / Other MD appointment. [place] of regression in wound condition at [phone]. Please direct any NON-WOUND related issues/requests for orders to patient's Primary Care Physician #1 I am pleased that the progression of the wounds on the legs as described. We will continue Prisma for this week these appear to be on their way to progressing. #2 lymphadenopathy this was discovered on a duplex ultrasound of her left leg in November that was negative for DVT. Described a 6.2 cm lymph node in the left groin. She does not have any other palpable lymphadenopathy. I don't feel anything in her left groin although there is a lot of soft tissue here. My usual ALLANAH, WOOLBRIGHT ([id number]) practice would be to follow this  in 2 months with an ultrasound. This could obviously be reactive in the patient with a chronic Foley catheter however. The needs to be followed Electronic Signature(s) Signed: 04/04/2016 7:52:13 AM By: Gretta Cool RN, BSN, Kim RN, BSN Signed: 04/05/2016 2:36:16 AM By: Linton Ham MD Previous Signature: 04/03/2016 4:39:43 PM Version By: Linton Ham MD Entered By: Gretta Cool RN, BSN, Kim on 04/04/2016 07:52:13 KATHERLEEN, IMAMURA ([id number]) -------------------------------------------------------------------------------- SuperBill Details Patient Name: [name] Date of Service: 04/03/2016 Medical Record Patient Account Number: [id number] Number: Treating RN: Montey Hora [birth year] (65 y.o. Other Clinician: Date of Birth/Sex: Female) Treating Tevon Berhane Primary Care Physician/Extender: Peri Jefferson, [place] Physician: Weeks in Treatment: 19 Referring Physician: LADA, MELINDA Diagnosis Coding ICD-10 Codes Code Description [id number] Non-pressure chronic ulcer  of right calf with fat layer exposed L97.512 Non-pressure chronic ulcer of other part of right foot with fat layer exposed L97.222 Non-pressure chronic ulcer of left calf with fat layer exposed G35 Multiple sclerosis E66.01 Morbid (severe) obesity due to excess calories F17.218 Nicotine dependence, cigarettes, with other nicotine-induced disorders L04.3 Acute lymphadenitis of lower limb Facility Procedures CPT4: Description Modifier Quantity Code [id number] BILATERAL: Application of multi-layer venous compression 1 system; leg (below knee), including ankle and foot. Physician Procedures CPT4 Code Description: [id number] - WC PHYS LEVEL 4 - EST PT ICD-10 Description Diagnosis L97.212 Non-pressure chronic ulcer of right calf with fat la L97.512 Non-pressure chronic ulcer of other part of right fo L04.3 Acute lymphadenitis of lower limb Modifier: yer exposed ot with fat lay Quantity: 1 er exposed Electronic  Signature(s) Signed: 04/03/2016 4:25:38 PM By: Montey Hora Signed: 04/03/2016 4:39:43 PM By: Linton Ham MD Entered By: Montey Hora on 04/03/2016 16:25:38

## 2016-04-04 NOTE — Progress Notes (Signed)
[name], [name] Visit Report for 04/03/2016 Arrival Information Details Patient Name: [name], [name] Date of Service: 04/03/2016 2:15 PM Medical Record Patient Account Number: [id number] Number: Treating RN: Montey Hora Dec 28, 1950 (65 y.o. Other Clinician: Date of Birth/Sex: Female) Treating ROBSON, [place] Primary Care Physician: LADA, MELINDA Physician/Extender: G Referring Physician: LADA, MELINDA Weeks in Treatment: 37 Visit Information History Since Last Visit Added or deleted any medications: No Patient Arrived: Wheel Chair Any new allergies or adverse reactions: No Arrival Time: 14:25 Had a fall or experienced change in No activities of daily living that may affect Accompanied By: self risk of falls: Transfer Assistance: None Signs or symptoms of abuse/neglect since last No Patient Identification Verified: Yes visito Secondary Verification Process Yes Hospitalized since last visit: No Completed: Pain Present Now: Yes Patient Requires Transmission-Based No Precautions: Patient Has Alerts: Yes Electronic Signature(s) Signed: 04/03/2016 4:39:21 PM By: Montey Hora Entered By: Montey Hora on 04/03/2016 14:32:30 [name] -------------------------------------------------------------------------------- Encounter Discharge Information Details Patient Name: [name] Date of Service: 04/03/2016 2:15 PM Medical Record Patient Account Number: [id number] Number: Treating RN: Montey Hora [birth year] (65 y.o. Other Clinician: Date of Birth/Sex: Female) Treating ROBSON, [place] Primary Care Physician: LADA, MELINDA Physician/Extender: G Referring Physician: LADA, MELINDA Weeks in Treatment: 50 Encounter Discharge Information Items Discharge Pain Level: 0 Discharge Condition: Stable Ambulatory Status: Wheelchair Discharge Destination: Home Transportation: Private Auto Accompanied By: self Schedule Follow-up  Appointment: Yes Medication Reconciliation completed and provided to Patient/Care No [name]: Provided on Clinical Summary of Care: 04/03/2016 Form Type Recipient Paper Patient BW Electronic Signature(s) Signed: 04/03/2016 4:26:24 PM By: Montey Hora Previous Signature: 04/03/2016 3:07:48 PM Version By: Ruthine Dose Entered By: Montey Hora on 04/03/2016 16:26:24 [name] -------------------------------------------------------------------------------- Lower Extremity Assessment Details Patient Name: [name] Date of Service: 04/03/2016 2:15 PM Medical Record Patient Account Number: [id number] Number: Treating RN: Montey Hora 09/28/1950 (65 y.o. Other Clinician: Date of Birth/Sex: Female) Treating ROBSON, [place] Primary Care Physician: LADA, MELINDA Physician/Extender: G Referring Physician: LADA, MELINDA Weeks in Treatment: 19 Edema Assessment Assessed: [Left: No] [Right: No] Edema: [Left: No] [Right: No] Calf Left: Right: Point of Measurement: 33 cm From Medial Instep 34 cm 34.3 cm Ankle Left: Right: Point of Measurement: 11 cm From Medial Instep 21 cm 21 cm Vascular Assessment Pulses: Posterior Tibial Dorsalis Pedis Palpable: [Left:Yes] [Right:Yes] Extremity colors, hair growth, and conditions: Extremity Color: [Left:Normal] [Right:Normal] Hair Growth on Extremity: [Left:No] [Right:No] Temperature of Extremity: [Left:Warm] [Right:Warm] Capillary Refill: [Left:< 3 seconds] [Right:< 3 seconds] Electronic Signature(s) Signed: 04/03/2016 4:39:21 PM By: Montey Hora Entered By: Montey Hora on 04/03/2016 14:44:28 Causey, [name] ([id number]) -------------------------------------------------------------------------------- Multi Wound Chart Details Patient Name: [name] Date of Service: 04/03/2016 2:15 PM Medical Record Patient Account Number: [id number] Number: Treating RN: Montey Hora 02/13/1951 (65  y.o. Other Clinician: Date of Birth/Sex: Female) Treating ROBSON, MICHAEL Primary Care Physician: LADA, MELINDA Physician/Extender: G Referring Physician: LADA, MELINDA Weeks in Treatment: 19 Vital Signs Height(in): 63 Pulse(bpm): 79 Weight(lbs): 270 Blood Pressure 113/55 (mmHg): Body Mass Index(BMI): 48 Temperature(F): Respiratory Rate 20 (breaths/min): Photos: [N/A:N/A] Wound Location: Right Lower Leg Left Lower Leg - Lateral, N/A Distal Wounding Event: Trauma Gradually Appeared N/A Primary Etiology: Trauma, Other Venous Leg Ulcer N/A Comorbid History: Cataracts, Chronic Cataracts, Chronic N/A Obstructive Pulmonary Obstructive Pulmonary Disease (COPD), Sleep Disease (COPD), Sleep Apnea, Hypertension, Apnea, Hypertension, Osteoarthritis, Neuropathy Osteoarthritis, Neuropathy Date Acquired: 10/09/2015 12/21/2015 N/A Weeks of Treatment: 19 14 N/A Wound Status:  Open Open N/A Measurements L x W x D 0.6x0.3x0.1 1.2x1x0.1 N/A (cm) Area (cm) : 0.141 0.942 N/A Volume (cm) : 0.014 0.094 N/A % Reduction in Area: 71.50% -71.30% N/A % Reduction in Volume: 90.50% -70.90% N/A Classification: Partial Thickness Partial Thickness N/A Exudate Amount: Large Large N/A Exudate Type: Serous Serous N/A ALAZAE, SAWHNEY. ([id number]) Exudate Color: amber amber N/A Wound Margin: Flat and Intact Flat and Intact N/A Granulation Amount: Large (67-100%) Large (67-100%) N/A Granulation Quality: Pink Pink N/A Necrotic Amount: Small (1-33%) Small (1-33%) N/A Exposed Structures: Fascia: No Fascia: No N/A Fat: No Fat: No Tendon: No Tendon: No Muscle: No Muscle: No Joint: No Joint: No Bone: No Bone: No Limited to Skin Limited to Skin Breakdown Breakdown Epithelialization: Medium (34-66%) None N/A Periwound Skin Texture: Edema: No Edema: No N/A Excoriation: No Excoriation: No Induration: No Induration: No Callus: No Callus: No Crepitus: No Crepitus: No Fluctuance: No Fluctuance:  No Friable: No Friable: No Rash: No Rash: No Scarring: No Scarring: No Periwound Skin Maceration: No Moist: Yes N/A Moisture: Moist: No Maceration: No Dry/Scaly: No Dry/Scaly: No Periwound Skin Color: Erythema: Yes Erythema: Yes N/A Atrophie Blanche: No Atrophie Blanche: No Cyanosis: No Cyanosis: No Ecchymosis: No Ecchymosis: No Hemosiderin Staining: No Hemosiderin Staining: No Mottled: No Mottled: No Pallor: No Pallor: No Rubor: No Rubor: No Erythema Location: Circumferential Circumferential N/A Temperature: No Abnormality No Abnormality N/A Tenderness on Yes Yes N/A Palpation: Wound Preparation: Ulcer Cleansing: Other: Ulcer Cleansing: Other: N/A soap and water soap and water Topical Anesthetic Topical Anesthetic Applied: Other: lidocaine Applied: Other: lidocaine 4% 4% Treatment Notes Electronic Signature(s) Signed: 04/03/2016 4:39:21 PM By: Naaman Plummer, [name] ([id number]) Entered By: Montey Hora on 04/03/2016 14:46:50 [name] ([id number]) -------------------------------------------------------------------------------- Multi-Disciplinary Care Plan Details Patient Name: [name] Date of Service: 04/03/2016 2:15 PM Medical Record Patient Account Number: [id number] Number: Treating RN: Montey Hora 1950-09-04 (65 y.o. Other Clinician: Date of Birth/Sex: Female) Treating ROBSON, [place] Primary Care Physician: LADA, MELINDA Physician/Extender: G Referring Physician: LADA, MELINDA Weeks in Treatment: 74 Active Inactive Abuse / Safety / Falls / Self Care Management Nursing Diagnoses: Impaired physical mobility Potential for falls Goals: Patient will remain injury free Date Initiated: 11/20/2015 Goal Status: Active Interventions: Assess fall risk on admission and as needed Notes: Nutrition Nursing Diagnoses: Imbalanced nutrition Goals: Patient/caregiver agrees to and verbalizes understanding of need to use  nutritional supplements and/or vitamins as prescribed Date Initiated: 11/20/2015 Goal Status: Active Interventions: Provide education on nutrition Treatment Activities: Education provided on Nutrition : 03/19/2016 Notes: Orientation to the [facility] ALEXZIA, VISWANATHAN ([id number]) Nursing Diagnoses: Knowledge deficit related to the wound healing center program Goals: Patient/caregiver will verbalize understanding of the [facility] Date Initiated: 11/20/2015 Goal Status: Active Interventions: Provide education on orientation to the wound center Notes: Wound/Skin Impairment Nursing Diagnoses: Impaired tissue integrity Goals: Patient will demonstrate a reduced rate of smoking or cessation of smoking Date Initiated: 11/20/2015 Goal Status: Active Ulcer/skin breakdown will heal within 14 weeks Date Initiated: 11/20/2015 Goal Status: Active Interventions: Assess patient/caregiver ability to obtain necessary supplies Provide education on smoking Notes: Electronic Signature(s) Signed: 04/03/2016 4:39:21 PM By: Montey Hora Entered By: Montey Hora on 04/03/2016 14:46:40 [name] ([id number]) -------------------------------------------------------------------------------- Pain Assessment Details Patient Name: [name] Date of Service: 04/03/2016 2:15 PM Medical Record Patient Account Number: [id number] Number: Treating RN: Montey Hora Jul 09, 1950 (65 y.o. Other Clinician: Date of Birth/Sex: Female) Treating ROBSON, [place] Primary Care Physician:  LADA, MELINDA Physician/Extender: G Referring Physician: LADA, MELINDA Weeks in Treatment: 51 Active Problems Location of Pain Severity and Description of Pain Patient Has Paino Yes Site Locations Pain Location: Generalized Pain With Dressing Change: No Duration of the Pain. Constant / Intermittento Constant Pain Management and Medication Current Pain Management: Notes Topical  or injectable lidocaine is offered to patient for acute pain when surgical debridement is performed. If needed, Patient is instructed to use over the counter pain medication for the following 24-48 hours after debridement. Wound care MDs do not prescribed pain medications. Patient has chronic pain or uncontrolled pain. Patient has been instructed to make an appointment with their Primary Care Physician for pain management. Electronic Signature(s) Signed: 04/03/2016 4:39:21 PM By: Montey Hora Entered By: Montey Hora on 04/03/2016 14:32:41 [name] ([id number]) -------------------------------------------------------------------------------- Patient/Caregiver Education Details Patient Name: [name] Date of Service: 04/03/2016 2:15 PM Medical Record Patient Account Number: [id number] Number: Treating RN: Montey Hora Sep 22, 1950 (65 y.o. Other Clinician: Date of Birth/Gender: Female) Treating ROBSON, [place] Primary Care Physician: LADA, MELINDA Physician/Extender: G Referring Physician: LADA, MELINDA Weeks in Treatment: 55 Education Assessment Education Provided To: Patient Education Topics Provided Venous: Handouts: Other: leg elevation Methods: Explain/Verbal Responses: State content correctly Electronic Signature(s) Signed: 04/03/2016 4:39:21 PM By: Montey Hora Entered By: Montey Hora on 04/03/2016 16:26:40 Duffell, [name] ([id number]) -------------------------------------------------------------------------------- Wound Assessment Details Patient Name: [name] Date of Service: 04/03/2016 2:15 PM Medical Record Patient Account Number: [id number] Number: Treating RN: Montey Hora November 03, 1950 (65 y.o. Other Clinician: Date of Birth/Sex: Female) Treating ROBSON, MICHAEL Primary Care Physician: LADA, MELINDA Physician/Extender: G Referring Physician: LADA, MELINDA Weeks in Treatment: 19 Wound Status Wound Number: 3 Primary  Trauma, Other Etiology: Wound Location: Right Lower Leg Wound Open Wounding Event: Trauma Status: Date Acquired: 10/09/2015 Comorbid Cataracts, Chronic Obstructive Weeks Of Treatment: 19 History: Pulmonary Disease (COPD), Sleep Clustered Wound: No Apnea, Hypertension, Osteoarthritis, Neuropathy Photos Wound Measurements Length: (cm) 0.6 % Reduction in Area Width: (cm) 0.3 % Reduction in Volu Depth: (cm) 0.1 Epithelialization: Area: (cm) 0.141 Tunneling: Volume: (cm) 0.014 Undermining: : 71.5% me: 90.5% Medium (34-66%) No No Wound Description Classification: Partial Thickness Wound Margin: Flat and Intact Exudate Amount: Large Exudate Type: Serous Exudate Color: amber Wound Bed Granulation Amount: Large (67-100%) Exposed Structure Granulation Quality: Pink Fascia Exposed: No Kauth, Chanteria H. ([id number]) Necrotic Amount: Small (1-33%) Fat Layer Exposed: No Necrotic Quality: Adherent Slough Tendon Exposed: No Muscle Exposed: No Joint Exposed: No Bone Exposed: No Limited to Skin Breakdown Periwound Skin Texture Texture Color No Abnormalities Noted: No No Abnormalities Noted: No Callus: No Atrophie Blanche: No Crepitus: No Cyanosis: No Excoriation: No Ecchymosis: No Fluctuance: No Erythema: Yes Friable: No Erythema Location: Circumferential Induration: No Hemosiderin Staining: No Localized Edema: No Mottled: No Rash: No Pallor: No Scarring: No Rubor: No Moisture Temperature / Pain No Abnormalities Noted: No Temperature: No Abnormality Dry / Scaly: No Tenderness on Palpation: Yes Maceration: No Moist: No Wound Preparation Ulcer Cleansing: Other: soap and water, Topical Anesthetic Applied: Other: lidocaine 4%, Treatment Notes Wound #3 (Right Lower Leg) 1. Cleansed with: Cleanse wound with antibacterial soap and water 4. Dressing Applied: Prisma Ag 5. Secondary Dressing Applied ABD Pad 7. Secured with 4 Layer Compression System -  Bilateral Notes unna to anchor Electronic Signature(s) Signed: 04/03/2016 4:39:21 PM By: Montey Hora Entered By: Montey Hora on 04/03/2016 14:42:21 Echeverri, [name] ([id number]) Umbach, [name] ([id number]) -------------------------------------------------------------------------------- Wound Assessment Details Patient Name: [name] Date of  Service: 04/03/2016 2:15 PM Medical Record Patient Account Number: [id number] Number: Treating RN: Montey Hora 1951-02-19 (65 y.o. Other Clinician: Date of Birth/Sex: Female) Treating ROBSON, MICHAEL Primary Care Physician: LADA, MELINDA Physician/Extender: G Referring Physician: LADA, MELINDA Weeks in Treatment: 19 Wound Status Wound Number: 5 Primary Venous Leg Ulcer Etiology: Wound Location: Left Lower Leg - Lateral, Distal Wound Open Wounding Event: Gradually Appeared Status: Date Acquired: 12/21/2015 Comorbid Cataracts, Chronic Obstructive Weeks Of Treatment: 14 History: Pulmonary Disease (COPD), Sleep Clustered Wound: No Apnea, Hypertension, Osteoarthritis, Neuropathy Photos Wound Measurements Length: (cm) 1.2 Width: (cm) 1 Depth: (cm) 0.1 Area: (cm) 0.942 Volume: (cm) 0.094 % Reduction in Area: -71.3% % Reduction in Volume: -70.9% Epithelialization: None Tunneling: No Undermining: No Wound Description Classification: Partial Thickness Wound Margin: Flat and Intact Exudate Amount: Large Exudate Type: Serous Exudate Color: amber Wound Bed Granulation Amount: Large (67-100%) Exposed Structure Granulation Quality: Pink Fascia Exposed: No Bennis, Shonteria H. ([id number]) Necrotic Amount: Small (1-33%) Fat Layer Exposed: No Necrotic Quality: Adherent Slough Tendon Exposed: No Muscle Exposed: No Joint Exposed: No Bone Exposed: No Limited to Skin Breakdown Periwound Skin Texture Texture Color No Abnormalities Noted: No No Abnormalities Noted: No Callus: No Atrophie Blanche: No Crepitus:  No Cyanosis: No Excoriation: No Ecchymosis: No Fluctuance: No Erythema: Yes Friable: No Erythema Location: Circumferential Induration: No Hemosiderin Staining: No Localized Edema: No Mottled: No Rash: No Pallor: No Scarring: No Rubor: No Moisture Temperature / Pain No Abnormalities Noted: No Temperature: No Abnormality Dry / Scaly: No Tenderness on Palpation: Yes Maceration: No Moist: Yes Wound Preparation Ulcer Cleansing: Other: soap and water, Topical Anesthetic Applied: Other: lidocaine 4%, Treatment Notes Wound #5 (Left, Distal, Lateral Lower Leg) 1. Cleansed with: Cleanse wound with antibacterial soap and water 4. Dressing Applied: Prisma Ag 5. Secondary Dressing Applied ABD Pad 7. Secured with 4 Layer Compression System - Bilateral Notes unna to anchor Electronic Signature(s) Signed: 04/03/2016 4:39:21 PM By: Montey Hora Entered By: Montey Hora on 04/03/2016 14:42:41 Preslar, [name] ([id number]) MINELA, ALKHAFAJI ([id number]) -------------------------------------------------------------------------------- Vitals Details Patient Name: [name] Date of Service: 04/03/2016 2:15 PM Medical Record Patient Account Number: [id number] Number: Treating RN: Montey Hora 08-15-1950 (65 y.o. Other Clinician: Date of Birth/Sex: Female) Treating ROBSON, [place] Primary Care Physician: LADA, MELINDA Physician/Extender: G Referring Physician: LADA, MELINDA Weeks in Treatment: 19 Vital Signs Time Taken: 14:32 Pulse (bpm): 79 Height (in): 63 Respiratory Rate (breaths/min): 20 Weight (lbs): 270 Blood Pressure (mmHg): 113/55 Body Mass Index (BMI): 47.8 Reference Range: 80 - 120 mg / dl Electronic Signature(s) Signed: 04/03/2016 4:39:21 PM By: Montey Hora Entered By: Montey Hora on 04/03/2016 14:32:57

## 2016-04-05 ENCOUNTER — Ambulatory Visit: Payer: Medicare Other | Admitting: Family Medicine

## 2016-04-05 DIAGNOSIS — R339 Retention of urine, unspecified: Secondary | ICD-10-CM | POA: Diagnosis not present

## 2016-04-05 DIAGNOSIS — G35 Multiple sclerosis: Secondary | ICD-10-CM | POA: Diagnosis not present

## 2016-04-05 DIAGNOSIS — I70234 Atherosclerosis of native arteries of right leg with ulceration of heel and midfoot: Secondary | ICD-10-CM | POA: Diagnosis not present

## 2016-04-05 DIAGNOSIS — L97219 Non-pressure chronic ulcer of right calf with unspecified severity: Secondary | ICD-10-CM | POA: Diagnosis not present

## 2016-04-05 DIAGNOSIS — L03116 Cellulitis of left lower limb: Secondary | ICD-10-CM | POA: Diagnosis not present

## 2016-04-05 DIAGNOSIS — J449 Chronic obstructive pulmonary disease, unspecified: Secondary | ICD-10-CM | POA: Diagnosis not present

## 2016-04-05 DIAGNOSIS — I70232 Atherosclerosis of native arteries of right leg with ulceration of calf: Secondary | ICD-10-CM | POA: Diagnosis not present

## 2016-04-05 DIAGNOSIS — L97419 Non-pressure chronic ulcer of right heel and midfoot with unspecified severity: Secondary | ICD-10-CM | POA: Diagnosis not present

## 2016-04-05 DIAGNOSIS — I70249 Atherosclerosis of native arteries of left leg with ulceration of unspecified site: Secondary | ICD-10-CM | POA: Diagnosis not present

## 2016-04-05 DIAGNOSIS — I10 Essential (primary) hypertension: Secondary | ICD-10-CM | POA: Diagnosis not present

## 2016-04-05 DIAGNOSIS — L97929 Non-pressure chronic ulcer of unspecified part of left lower leg with unspecified severity: Secondary | ICD-10-CM | POA: Diagnosis not present

## 2016-04-05 DIAGNOSIS — Z792 Long term (current) use of antibiotics: Secondary | ICD-10-CM | POA: Diagnosis not present

## 2016-04-08 DIAGNOSIS — I70249 Atherosclerosis of native arteries of left leg with ulceration of unspecified site: Secondary | ICD-10-CM | POA: Diagnosis not present

## 2016-04-08 DIAGNOSIS — L03116 Cellulitis of left lower limb: Secondary | ICD-10-CM | POA: Diagnosis not present

## 2016-04-08 DIAGNOSIS — L97929 Non-pressure chronic ulcer of unspecified part of left lower leg with unspecified severity: Secondary | ICD-10-CM | POA: Diagnosis not present

## 2016-04-08 DIAGNOSIS — I70234 Atherosclerosis of native arteries of right leg with ulceration of heel and midfoot: Secondary | ICD-10-CM | POA: Diagnosis not present

## 2016-04-08 DIAGNOSIS — I70232 Atherosclerosis of native arteries of right leg with ulceration of calf: Secondary | ICD-10-CM | POA: Diagnosis not present

## 2016-04-08 DIAGNOSIS — R339 Retention of urine, unspecified: Secondary | ICD-10-CM | POA: Diagnosis not present

## 2016-04-08 DIAGNOSIS — L97219 Non-pressure chronic ulcer of right calf with unspecified severity: Secondary | ICD-10-CM | POA: Diagnosis not present

## 2016-04-08 DIAGNOSIS — Z792 Long term (current) use of antibiotics: Secondary | ICD-10-CM | POA: Diagnosis not present

## 2016-04-08 DIAGNOSIS — G35 Multiple sclerosis: Secondary | ICD-10-CM | POA: Diagnosis not present

## 2016-04-08 DIAGNOSIS — L97419 Non-pressure chronic ulcer of right heel and midfoot with unspecified severity: Secondary | ICD-10-CM | POA: Diagnosis not present

## 2016-04-08 DIAGNOSIS — I10 Essential (primary) hypertension: Secondary | ICD-10-CM | POA: Diagnosis not present

## 2016-04-08 DIAGNOSIS — J449 Chronic obstructive pulmonary disease, unspecified: Secondary | ICD-10-CM | POA: Diagnosis not present

## 2016-04-09 DIAGNOSIS — I70249 Atherosclerosis of native arteries of left leg with ulceration of unspecified site: Secondary | ICD-10-CM | POA: Diagnosis not present

## 2016-04-09 DIAGNOSIS — L97929 Non-pressure chronic ulcer of unspecified part of left lower leg with unspecified severity: Secondary | ICD-10-CM | POA: Diagnosis not present

## 2016-04-09 DIAGNOSIS — G35 Multiple sclerosis: Secondary | ICD-10-CM | POA: Diagnosis not present

## 2016-04-09 DIAGNOSIS — I70232 Atherosclerosis of native arteries of right leg with ulceration of calf: Secondary | ICD-10-CM | POA: Diagnosis not present

## 2016-04-09 DIAGNOSIS — Z792 Long term (current) use of antibiotics: Secondary | ICD-10-CM | POA: Diagnosis not present

## 2016-04-09 DIAGNOSIS — I70234 Atherosclerosis of native arteries of right leg with ulceration of heel and midfoot: Secondary | ICD-10-CM | POA: Diagnosis not present

## 2016-04-09 DIAGNOSIS — I10 Essential (primary) hypertension: Secondary | ICD-10-CM | POA: Diagnosis not present

## 2016-04-09 DIAGNOSIS — J449 Chronic obstructive pulmonary disease, unspecified: Secondary | ICD-10-CM | POA: Diagnosis not present

## 2016-04-09 DIAGNOSIS — L03116 Cellulitis of left lower limb: Secondary | ICD-10-CM | POA: Diagnosis not present

## 2016-04-09 DIAGNOSIS — L97419 Non-pressure chronic ulcer of right heel and midfoot with unspecified severity: Secondary | ICD-10-CM | POA: Diagnosis not present

## 2016-04-09 DIAGNOSIS — R339 Retention of urine, unspecified: Secondary | ICD-10-CM | POA: Diagnosis not present

## 2016-04-09 DIAGNOSIS — L97219 Non-pressure chronic ulcer of right calf with unspecified severity: Secondary | ICD-10-CM | POA: Diagnosis not present

## 2016-04-10 ENCOUNTER — Encounter: Payer: Medicare Other | Admitting: Internal Medicine

## 2016-04-10 DIAGNOSIS — L97211 Non-pressure chronic ulcer of right calf limited to breakdown of skin: Secondary | ICD-10-CM | POA: Diagnosis not present

## 2016-04-10 DIAGNOSIS — L97512 Non-pressure chronic ulcer of other part of right foot with fat layer exposed: Secondary | ICD-10-CM | POA: Diagnosis not present

## 2016-04-10 DIAGNOSIS — G4733 Obstructive sleep apnea (adult) (pediatric): Secondary | ICD-10-CM | POA: Diagnosis not present

## 2016-04-10 DIAGNOSIS — J449 Chronic obstructive pulmonary disease, unspecified: Secondary | ICD-10-CM | POA: Diagnosis not present

## 2016-04-10 DIAGNOSIS — G35 Multiple sclerosis: Secondary | ICD-10-CM | POA: Diagnosis not present

## 2016-04-10 DIAGNOSIS — I10 Essential (primary) hypertension: Secondary | ICD-10-CM | POA: Diagnosis not present

## 2016-04-10 DIAGNOSIS — L043 Acute lymphadenitis of lower limb: Secondary | ICD-10-CM | POA: Diagnosis not present

## 2016-04-10 DIAGNOSIS — L97222 Non-pressure chronic ulcer of left calf with fat layer exposed: Secondary | ICD-10-CM | POA: Diagnosis not present

## 2016-04-10 DIAGNOSIS — L97212 Non-pressure chronic ulcer of right calf with fat layer exposed: Secondary | ICD-10-CM | POA: Diagnosis not present

## 2016-04-10 DIAGNOSIS — I739 Peripheral vascular disease, unspecified: Secondary | ICD-10-CM | POA: Diagnosis not present

## 2016-04-10 DIAGNOSIS — L97221 Non-pressure chronic ulcer of left calf limited to breakdown of skin: Secondary | ICD-10-CM | POA: Diagnosis not present

## 2016-04-10 DIAGNOSIS — Z79899 Other long term (current) drug therapy: Secondary | ICD-10-CM | POA: Diagnosis not present

## 2016-04-11 DIAGNOSIS — I70232 Atherosclerosis of native arteries of right leg with ulceration of calf: Secondary | ICD-10-CM | POA: Diagnosis not present

## 2016-04-11 DIAGNOSIS — I10 Essential (primary) hypertension: Secondary | ICD-10-CM | POA: Diagnosis not present

## 2016-04-11 DIAGNOSIS — J449 Chronic obstructive pulmonary disease, unspecified: Secondary | ICD-10-CM | POA: Diagnosis not present

## 2016-04-11 DIAGNOSIS — I70234 Atherosclerosis of native arteries of right leg with ulceration of heel and midfoot: Secondary | ICD-10-CM | POA: Diagnosis not present

## 2016-04-11 DIAGNOSIS — I70249 Atherosclerosis of native arteries of left leg with ulceration of unspecified site: Secondary | ICD-10-CM | POA: Diagnosis not present

## 2016-04-11 DIAGNOSIS — Z792 Long term (current) use of antibiotics: Secondary | ICD-10-CM | POA: Diagnosis not present

## 2016-04-11 DIAGNOSIS — L97419 Non-pressure chronic ulcer of right heel and midfoot with unspecified severity: Secondary | ICD-10-CM | POA: Diagnosis not present

## 2016-04-11 DIAGNOSIS — R339 Retention of urine, unspecified: Secondary | ICD-10-CM | POA: Diagnosis not present

## 2016-04-11 DIAGNOSIS — L97929 Non-pressure chronic ulcer of unspecified part of left lower leg with unspecified severity: Secondary | ICD-10-CM | POA: Diagnosis not present

## 2016-04-11 DIAGNOSIS — L97219 Non-pressure chronic ulcer of right calf with unspecified severity: Secondary | ICD-10-CM | POA: Diagnosis not present

## 2016-04-11 DIAGNOSIS — G35 Multiple sclerosis: Secondary | ICD-10-CM | POA: Diagnosis not present

## 2016-04-11 DIAGNOSIS — L03116 Cellulitis of left lower limb: Secondary | ICD-10-CM | POA: Diagnosis not present

## 2016-04-11 NOTE — Progress Notes (Signed)
SCOTTI, KIDDOO ([id number]) Visit Report for 04/10/2016 Arrival Information Details Patient Name: LAITYN, HEINZERLING Date of Service: 04/10/2016 12:45 PM Medical Record Patient Account Number: [id number] Number: Treating RN: Montey Hora 07-24-1950 (65 y.o. Other Clinician: Date of Birth/Sex: Female) Treating ROBSON, [place] Primary Care Physician: LADA, MELINDA Physician/Extender: G Referring Physician: LADA, MELINDA Weeks in Treatment: 20 Visit Information History Since Last Visit Added or deleted any medications: No Patient Arrived: Wheel Chair Any new allergies or adverse reactions: No Arrival Time: 12:44 Had a fall or experienced change in No activities of daily living that may affect Accompanied By: friend risk of falls: Transfer Assistance: None Signs or symptoms of abuse/neglect since last No Patient Identification Verified: Yes visito Secondary Verification Process Yes Hospitalized since last visit: No Completed: Has Dressing in Place as Prescribed: Yes Patient Requires Transmission-Based No Has Compression in Place as Prescribed: Yes Precautions: Pain Present Now: No Patient Has Alerts: Yes Electronic Signature(s) Signed: 04/10/2016 6:00:24 PM By: Montey Hora Entered By: Montey Hora on 04/10/2016 12:50:56 [name] ([id number]) -------------------------------------------------------------------------------- Encounter Discharge Information Details Patient Name: [name] Date of Service: 04/10/2016 12:45 PM Medical Record Patient Account Number: [id number] Number: Treating RN: Montey Hora 1950/06/22 (65 y.o. Other Clinician: Date of Birth/Sex: Female) Treating ROBSON, [place] Primary Care Physician: LADA, MELINDA Physician/Extender: G Referring Physician: LADA, MELINDA Weeks in Treatment: 20 Encounter Discharge Information Items Discharge Pain Level: 0 Discharge Condition: Stable Ambulatory Status:  Wheelchair Discharge Destination: Home Transportation: Private Auto Accompanied By: friend Schedule Follow-up Appointment: Yes Medication Reconciliation completed and provided to Patient/Care No Timberlynn Kizziah: Provided on Clinical Summary of Care: 04/10/2016 Form Type Recipient Paper Patient BW Electronic Signature(s) Signed: 04/10/2016 1:56:05 PM By: Montey Hora Previous Signature: 04/10/2016 1:33:27 PM Version By: Ruthine Dose Entered By: Montey Hora on 04/10/2016 13:56:05 [name] ([id number]) -------------------------------------------------------------------------------- Lower Extremity Assessment Details Patient Name: [name] Date of Service: 04/10/2016 12:45 PM Medical Record Patient Account Number: [id number] Number: Treating RN: Montey Hora 02/09/1951 (65 y.o. Other Clinician: Date of Birth/Sex: Female) Treating ROBSON, [place] Primary Care Physician: LADA, MELINDA Physician/Extender: G Referring Physician: LADA, MELINDA Weeks in Treatment: 20 Edema Assessment Assessed: [Left: No] [Right: No] Edema: [Left: No] [Right: No] Calf Left: Right: Point of Measurement: 33 cm From Medial Instep cm cm Ankle Left: Right: Point of Measurement: 11 cm From Medial Instep cm cm Vascular Assessment Pulses: Posterior Tibial Dorsalis Pedis Palpable: [Left:Yes] [Right:Yes] Extremity colors, hair growth, and conditions: Extremity Color: [Left:Hyperpigmented] [Right:Hyperpigmented] Hair Growth on Extremity: [Left:No] [Right:No] Temperature of Extremity: [Left:Warm] [Right:Warm] Capillary Refill: [Left:< 3 seconds] [Right:< 3 seconds] Electronic Signature(s) Signed: 04/10/2016 6:00:24 PM By: Montey Hora Entered By: Montey Hora on 04/10/2016 13:14:18 Caldas, [name] ([id number]) -------------------------------------------------------------------------------- Multi Wound Chart Details Patient Name: [name] Date of Service: 04/10/2016  12:45 PM Medical Record Patient Account Number: [id number] Number: Treating RN: Montey Hora 10/08/1950 (65 y.o. Other Clinician: Date of Birth/Sex: Female) Treating ROBSON, MICHAEL Primary Care Physician: LADA, MELINDA Physician/Extender: G Referring Physician: LADA, MELINDA Weeks in Treatment: 20 Vital Signs Height(in): 63 Pulse(bpm): 79 Weight(lbs): 270 Blood Pressure 123/82 (mmHg): Body Mass Index(BMI): 48 Temperature(F): 98.4 Respiratory Rate 20 (breaths/min): Photos: [N/A:N/A] Wound Location: Right Lower Leg Left Lower Leg - Lateral, N/A Distal Wounding Event: Trauma Gradually Appeared N/A Primary Etiology: Trauma, Other Venous Leg Ulcer N/A Comorbid History: Cataracts, Chronic Cataracts, Chronic N/A Obstructive Pulmonary Obstructive Pulmonary Disease (COPD), Sleep Disease (COPD), Sleep Apnea, Hypertension, Apnea, Hypertension, Osteoarthritis, Neuropathy Osteoarthritis, Neuropathy Date Acquired:  10/09/2015 12/21/2015 N/A Weeks of Treatment: 20 15 N/A Wound Status: Healed - Epithelialized Open N/A Measurements L x W x D 0x0x0 0.4x0.5x0.1 N/A (cm) Area (cm) : 0 0.157 N/A Volume (cm) : 0 0.016 N/A % Reduction in Area: 100.00% 71.50% N/A % Reduction in Volume: 100.00% 70.90% N/A Classification: Partial Thickness Partial Thickness N/A Exudate Amount: Large Large N/A Exudate Type: Serous Serous N/A Exudate Color: amber amber N/A Wound Margin: Flat and Intact Flat and Intact N/A Sobotka, Timisha H. ([id number]) Granulation Amount: None Present (0%) Large (67-100%) N/A Granulation Quality: N/A Pink N/A Necrotic Amount: None Present (0%) Small (1-33%) N/A Exposed Structures: Fascia: No Fascia: No N/A Fat: No Fat: No Tendon: No Tendon: No Muscle: No Muscle: No Joint: No Joint: No Bone: No Bone: No Limited to Skin Limited to Skin Breakdown Breakdown Epithelialization: Large (67-100%) Small (1-33%) N/A Periwound Skin Texture: Edema: No Edema: No  N/A Excoriation: No Excoriation: No Induration: No Induration: No Callus: No Callus: No Crepitus: No Crepitus: No Fluctuance: No Fluctuance: No Friable: No Friable: No Rash: No Rash: No Scarring: No Scarring: No Periwound Skin Maceration: No Moist: Yes N/A Moisture: Moist: No Maceration: No Dry/Scaly: No Dry/Scaly: No Periwound Skin Color: Erythema: Yes Erythema: Yes N/A Atrophie Blanche: No Atrophie Blanche: No Cyanosis: No Cyanosis: No Ecchymosis: No Ecchymosis: No Hemosiderin Staining: No Hemosiderin Staining: No Mottled: No Mottled: No Pallor: No Pallor: No Rubor: No Rubor: No Erythema Location: Circumferential Circumferential N/A Temperature: No Abnormality No Abnormality N/A Tenderness on Yes Yes N/A Palpation: Wound Preparation: Ulcer Cleansing: Other: Ulcer Cleansing: Other: N/A soap and water soap and water Topical Anesthetic Topical Anesthetic Applied: Other: lidocaine Applied: Other: lidocaine 4% 4% Treatment Notes Electronic Signature(s) Signed: 04/10/2016 6:00:24 PM By: Montey Hora Entered By: Montey Hora on 04/10/2016 13:14:34 Jindra, [name] ([id number]) GETRUDE, CANSLER ([id number]) -------------------------------------------------------------------------------- Multi-Disciplinary Care Plan Details Patient Name: [name] Date of Service: 04/10/2016 12:45 PM Medical Record Patient Account Number: [id number] Number: Treating RN: Montey Hora 19-Apr-1951 (65 y.o. Other Clinician: Date of Birth/Sex: Female) Treating ROBSON, [place] Primary Care Physician: LADA, MELINDA Physician/Extender: G Referring Physician: LADA, MELINDA Weeks in Treatment: 20 Active Inactive Abuse / Safety / Falls / Self Care Management Nursing Diagnoses: Impaired physical mobility Potential for falls Goals: Patient will remain injury free Date Initiated: 11/20/2015 Goal Status: Active Interventions: Assess fall risk on admission and as  needed Notes: Nutrition Nursing Diagnoses: Imbalanced nutrition Goals: Patient/caregiver agrees to and verbalizes understanding of need to use nutritional supplements and/or vitamins as prescribed Date Initiated: 11/20/2015 Goal Status: Active Interventions: Provide education on nutrition Treatment Activities: Education provided on Nutrition : 03/19/2016 Notes: Orientation to the [facility] ADIAH, DRAGOTTA ([id number]) Nursing Diagnoses: Knowledge deficit related to the wound healing center program Goals: Patient/caregiver will verbalize understanding of the [facility] Program Date Initiated: 11/20/2015 Goal Status: Active Interventions: Provide education on orientation to the wound center Notes: Wound/Skin Impairment Nursing Diagnoses: Impaired tissue integrity Goals: Patient will demonstrate a reduced rate of smoking or cessation of smoking Date Initiated: 11/20/2015 Goal Status: Active Ulcer/skin breakdown will heal within 14 weeks Date Initiated: 11/20/2015 Goal Status: Active Interventions: Assess patient/caregiver ability to obtain necessary supplies Provide education on smoking Notes: Electronic Signature(s) Signed: 04/10/2016 6:00:24 PM By: Montey Hora Entered By: Montey Hora on 04/10/2016 13:14:27 [name] ([id number]) -------------------------------------------------------------------------------- Pain Assessment Details Patient Name: [name] Date of Service: 04/10/2016 12:45 PM Medical Record Patient Account Number: [id number] Number: Treating RN: Marjory Lies,  Di Kindle 1951/04/11 (65 y.o. Other Clinician: Date of Birth/Sex: Female) Treating ROBSON, [place] Primary Care Physician: LADA, MELINDA Physician/Extender: G Referring Physician: LADA, MELINDA Weeks in Treatment: 20 Active Problems Location of Pain Severity and Description of Pain Patient Has Paino Yes Site Locations Pain Location: Generalized Pain Pain  Management and Medication Current Pain Management: Notes Topical or injectable lidocaine is offered to patient for acute pain when surgical debridement is performed. If needed, Patient is instructed to use over the counter pain medication for the following 24-48 hours after debridement. Wound care MDs do not prescribed pain medications. Patient has chronic pain or uncontrolled pain. Patient has been instructed to make an appointment with their Primary Care Physician for pain management. Electronic Signature(s) Signed: 04/10/2016 6:00:24 PM By: Montey Hora Entered By: Montey Hora on 04/10/2016 12:51:06 [name] ([id number]) -------------------------------------------------------------------------------- Patient/Caregiver Education Details Patient Name: [name] Date of Service: 04/10/2016 12:45 PM Medical Record Patient Account Number: [id number] Number: Treating RN: Montey Hora 02-21-1951 (65 y.o. Other Clinician: Date of Birth/Gender: Female) Treating ROBSON, [place] Primary Care Physician: LADA, MELINDA Physician/Extender: G Referring Physician: LADA, MELINDA Weeks in Treatment: 20 Education Assessment Education Provided To: Patient and Caregiver Education Topics Provided Venous: Handouts: Other: wear compression daily on right leg Methods: Explain/Verbal Responses: State content correctly Electronic Signature(s) Signed: 04/10/2016 6:00:24 PM By: Montey Hora Entered By: Montey Hora on 04/10/2016 13:56:29 Primm, [name] ([id number]) -------------------------------------------------------------------------------- Wound Assessment Details Patient Name: [name] Date of Service: 04/10/2016 12:45 PM Medical Record Patient Account Number: [id number] Number: Treating RN: Montey Hora 1950-06-18 (65 y.o. Other Clinician: Date of Birth/Sex: Female) Treating ROBSON, MICHAEL Primary Care Physician: LADA,  MELINDA Physician/Extender: G Referring Physician: LADA, MELINDA Weeks in Treatment: 20 Wound Status Wound Number: 3 Primary Trauma, Other Etiology: Wound Location: Right Lower Leg Wound Healed - Epithelialized Wounding Event: Trauma Status: Date Acquired: 10/09/2015 Comorbid Cataracts, Chronic Obstructive Weeks Of Treatment: 20 History: Pulmonary Disease (COPD), Sleep Clustered Wound: No Apnea, Hypertension, Osteoarthritis, Neuropathy Photos Wound Measurements Length: (cm) 0 % Reduction in A Width: (cm) 0 % Reduction in V Depth: (cm) 0 Epithelializatio Area: (cm) 0 Tunneling: Volume: (cm) 0 Undermining: rea: 100% olume: 100% n: Large (67-100%) No No Wound Description Classification: Partial Thickness Wound Margin: Flat and Intact Exudate Amount: Large Exudate Type: Serous Exudate Color: amber Wound Bed Granulation Amount: None Present (0%) Exposed Structure Necrotic Amount: None Present (0%) Fascia Exposed: No Fat Layer Exposed: No Tendon Exposed: No Earnshaw, Anissa H. ([id number]) Muscle Exposed: No Joint Exposed: No Bone Exposed: No Limited to Skin Breakdown Periwound Skin Texture Texture Color No Abnormalities Noted: No No Abnormalities Noted: No Callus: No Atrophie Blanche: No Crepitus: No Cyanosis: No Excoriation: No Ecchymosis: No Fluctuance: No Erythema: Yes Friable: No Erythema Location: Circumferential Induration: No Hemosiderin Staining: No Localized Edema: No Mottled: No Rash: No Pallor: No Scarring: No Rubor: No Moisture Temperature / Pain No Abnormalities Noted: No Temperature: No Abnormality Dry / Scaly: No Tenderness on Palpation: Yes Maceration: No Moist: No Wound Preparation Ulcer Cleansing: Other: soap and water, Topical Anesthetic Applied: Other: lidocaine 4%, Electronic Signature(s) Signed: 04/10/2016 6:00:24 PM By: Montey Hora Entered By: Montey Hora on 04/10/2016 13:12:27 [name]  ([id number]) -------------------------------------------------------------------------------- Wound Assessment Details Patient Name: [name] Date of Service: 04/10/2016 12:45 PM Medical Record Patient Account Number: [id number] Number: Treating RN: Montey Hora 1951-01-07 (65 y.o. Other Clinician: Date of Birth/Sex: Female) Treating ROBSON, MICHAEL Primary Care Physician: LADA, MELINDA Physician/Extender: G Referring Physician:  LADA, MELINDA Weeks in Treatment: 20 Wound Status Wound Number: 5 Primary Venous Leg Ulcer Etiology: Wound Location: Left Lower Leg - Lateral, Distal Wound Open Wounding Event: Gradually Appeared Status: Date Acquired: 12/21/2015 Comorbid Cataracts, Chronic Obstructive Weeks Of Treatment: 15 History: Pulmonary Disease (COPD), Sleep Clustered Wound: No Apnea, Hypertension, Osteoarthritis, Neuropathy Photos Wound Measurements Length: (cm) 0.4 Width: (cm) 0.5 Depth: (cm) 0.1 Area: (cm) 0.157 Volume: (cm) 0.016 % Reduction in Area: 71.5% % Reduction in Volume: 70.9% Epithelialization: Small (1-33%) Tunneling: No Undermining: No Wound Description Classification: Partial Thickness Wound Margin: Flat and Intact Exudate Amount: Large Exudate Type: Serous Exudate Color: amber Wound Bed Granulation Amount: Large (67-100%) Exposed Structure Granulation Quality: Pink Fascia Exposed: No Necrotic Amount: Small (1-33%) Fat Layer Exposed: No Necrotic Quality: Adherent Slough Tendon Exposed: No Canty, Marigrace H. ([id number]) Muscle Exposed: No Joint Exposed: No Bone Exposed: No Limited to Skin Breakdown Periwound Skin Texture Texture Color No Abnormalities Noted: No No Abnormalities Noted: No Callus: No Atrophie Blanche: No Crepitus: No Cyanosis: No Excoriation: No Ecchymosis: No Fluctuance: No Erythema: Yes Friable: No Erythema Location: Circumferential Induration: No Hemosiderin Staining: No Localized Edema:  No Mottled: No Rash: No Pallor: No Scarring: No Rubor: No Moisture Temperature / Pain No Abnormalities Noted: No Temperature: No Abnormality Dry / Scaly: No Tenderness on Palpation: Yes Maceration: No Moist: Yes Wound Preparation Ulcer Cleansing: Other: soap and water, Topical Anesthetic Applied: Other: lidocaine 4%, Treatment Notes Wound #5 (Left, Distal, Lateral Lower Leg) 1. Cleansed with: Cleanse wound with antibacterial soap and water 2. Anesthetic Topical Lidocaine 4% cream to wound bed prior to debridement 4. Dressing Applied: Prisma Ag 5. Secondary Dressing Applied ABD Pad 7. Secured with 3 Layer Compression System - Left Lower Extremity Notes unna to anchor Electronic Signature(s) Signed: 04/10/2016 6:00:24 PM By: Montey Hora Entered By: Montey Hora on 04/10/2016 13:12:54 Tolle, [name] ([id number]) LOMIE, DEUTCH ([id number]) -------------------------------------------------------------------------------- Vitals Details Patient Name: [name] Date of Service: 04/10/2016 12:45 PM Medical Record Patient Account Number: [id number] Number: Treating RN: Montey Hora 1950-09-21 (65 y.o. Other Clinician: Date of Birth/Sex: Female) Treating ROBSON, [place] Primary Care Physician: LADA, MELINDA Physician/Extender: G Referring Physician: LADA, MELINDA Weeks in Treatment: 20 Vital Signs Time Taken: 12:51 Temperature (F): 98.4 Height (in): 63 Pulse (bpm): 79 Weight (lbs): 270 Respiratory Rate (breaths/min): 20 Body Mass Index (BMI): 47.8 Blood Pressure (mmHg): 123/82 Reference Range: 80 - 120 mg / dl Electronic Signature(s) Signed: 04/10/2016 6:00:24 PM By: Montey Hora Entered By: Montey Hora on 04/10/2016 12:51:24

## 2016-04-11 NOTE — Progress Notes (Signed)
[name], [name] Visit Report for 04/10/2016 Chief Complaint Document Details Patient Name: [name], [name] Date of Service: 04/10/2016 12:45 PM Medical Record Patient Account Number: [id number] Number: Treating RN: [name] 1950-07-07 (65 y.o. Other Clinician: Date of Birth/Sex: Female) Treating [name] Primary Care [name]/Extender: [name], [place] [name]: Referring [name]: LADA, [name] Weeks in Treatment: 20 Information Obtained from: Patient Chief Complaint returns for evaluation of bilateral lower extremity ulcerations Electronic Signature(s) Signed: 04/10/2016 4:39:00 PM By: [name] Entered By: [name] on 04/10/2016 13:53:28 [name], [name] ([id number]) -------------------------------------------------------------------------------- HPI Details Patient Name: [name] Date of Service: 04/10/2016 12:45 PM Medical Record Patient Account Number: [id number] Number: Treating RN: [name] January 21, 1951 (65 y.o. Other Clinician: Date of Birth/Sex: Female) Treating [name] Primary Care [name]/Extender: [name], [name]: Referring [name]: LADA, [name] Weeks in Treatment: 20 History of Present Illness Location: left and right lower extremity and dorsum of the right foot Quality: Patient reports experiencing a sharp pain to affected area(s). Severity: Patient rates the pain to be a 8 out of 10 especially with palpation Duration: Patient has had the wound for > 6 months prior to seeking treatment at the wound center Timing: Pain in wound is Intermittent in severity but persistent Context: The wound appeared gradually over time Modifying Factors: Other treatment(s) tried include: symptomatic treatment as advised by her PCP Associated Signs and Symptoms: Patient reports having increase swelling iin her bilateral lower extremities HPI Description: 65 year old with a past medical  history significant for [name], urinary incontinence, and obesity. She has been seen in the wound clinic before for lower extremity ulcerations treated with compression therapy. she is also known to have hypertension, peripheral vascular disease, COPD, obstructive sleep apnea, lumbar radiculopathy, kyphoscoliosis, urinary issues and tobacco abuse. Smokes a packet of cigarettes a day was recently seen at the [facility] for swelling of her legs and feet with a ulceration on the dorsum of the right foot which has been there for about 6 months. she was recently in the ER about a month ago where she was seen for shortness of breath and swelling of the legs and a chest x-ray was within normal limits had an increase in her BNP and was given Lasix and put on doxycycline for a mild cellulitis and possible UTI. Wounds aresmaller today 11/13/15. Debrided and will continue Santyl. 12/21/2015 -- she was admitted to the hospital overnight on 12/18/2015 and diagnosed with urinary retention and cellulitis of the left lower leg. is past to take clindamycin and use Santyl for her wounds. 01/15/2016 -- come back to see [place] for almost a month and continues to be noncompliant with her dressings 01/30/16 patient presents today for a follow-up visit concerning her ongoing bilateral lower extremity wounds. We have not seen her for the past 2 weeks although we are supposed to be seen her for weekly visits. She currently has a Foley catheter. She tells me that the wounds are intensely painful especially with pressure and palpation at this point in time. Fortunately she is having no interval signs or symptoms of systemic infection but unfortunately the wounds have not improved dramatically since we last saw her. She does have home health coming to take care of her as well. She is currently not in any compression wraps. 02/06/16 ON evaluation today patient continues to experience discomfort regarding her bilateral  lower extremity ulcers. She has continued to tolerate the dressing changes at this point in time and continues  to have a Foley catheter as well. Fortunately she is back this week in the past she has been somewhat noncompliant with follow-ups I'm glad to see her today. She tells me that her pain level varies but can be as [name], [name] H. ([id number]) high as a 7 out of 10 with manipulation of the wound. She tells me that she used to be on oxycodone which was managed by the pain clinic although she is no longer on that as she tells me that she was actually smoking marijuana at the time and when this was found out they discontinued her pain medication. She no longer is taking anything pain medication wise and she also does not smoke cigarettes nor marijuana at this point. 02/12/16; this is a patient I don't believe I have previously seen. She has multiple sclerosis. She has 4 punched-out areas on the anterior lateral left leg 2 areas on the right these are all in the same condition. Reasonably small [dime size] wounds each was some depth. Surface of these wounds does not look particularly healthy as there is adherent slough. There is no evidence of surrounding infection or inflammation. ABIs in this clinic were 0.87 on the right and 0.81 on the left. She is listed as having PAD and is a smoker. Not a diabetic 02/20/16 patient I gave antibiotics to last week for erythema around both wound sites on the left lateral leg and right lateral leg. This appears to be a lot better. One of the areas on the left leg is healed however she still has 3 punched-out open areas on the left lateral calf and one on the right lateral calf and one on the dorsal foot. She is an ex-smoker quitting 1 month ago. ABIs in this clinic were 0.87 on the right 0.81 on the left 03/12/16; this is a patient I really don't have a good sense of. It would appear for the first 5 or 6 weeks of her stay in this clinic she was cared for by  [name]. She appeared on my schedule in mid October and I saw her twice. She has not been here however in 3 weeks. She has advanced Wound Care at home where she lives with her husband. She has multiple sclerosis. I saw her the first time she had 4 punched-out small wounds on the anterior lateral left leg it appears that she is now down to 2. She also had wounds on the lateral and medial aspect of her right leg which were also small and punched out. The only one that remains is on the right lateral. Because of the nature of her wound I went ahead and ordered formal arterial studies this showed an ABI of 1 on the right and one on the left. TBI of 1.4 on the right and 0.79 on the left. She had normal waveforms. She was in the emergency room on 02/28/16; there is a noted edema of her left leg after a fall. She had a duplex ultrasound that showed no evidence for an acute DVT from the left groin to the popliteal fossa. The study was limited in the calf veins due to edema. Also noticeable that she had a left inguinal enlarged lymph node up to 6.2 cm. She also had a left knee x-ray that showed no acute findings and a right foot x-ray that showed soft tissue swelling but no radiographic evidence of osteomyelitis. The patient does stop smoking 03-19-16 [name] presents today for evaluation of her bilateral lower  extremity ulcerations. She states that she has not smoked in several weeks. She denies any pain or discomfort to bilateral lower extremities, tolerating compression therapy as ordered. 03/26/16; I have not seen this patient in 2 weeks however she has done very well with improvements in the areas in question. After we obtain normal arterial studies, increasing the 4-layer compression really seems to look done a nice job here. She has one open area on the left lateral leg and one on the medial right leg. These have filled in nicely and are now superficial wounds that showed epithelialized. I note  the left inguinal lymph node at 6.2 cm. I may need to refer this back to the patient's primary doctor. 04/03/16; patient has 2 remaining wounds 1 on the left lateral leg and one on the right medial leg. Both of filled in nicely since we are able to increase her from a 3 layer to a 4 layer compression. I am also following up on the lymph node notable on her left leg DVT rule out in November 04/10/16; area on the right lateral leg is healed. The area on the left leg is still open but looks considerably better with healthy granulation and less wound area. Electronic Signature(s) Signed: 04/10/2016 4:39:00 PM By: [name] Entered By: [name] on 04/10/2016 13:55:55 [name] ([id number]) -------------------------------------------------------------------------------- Physical Exam Details Patient Name: [name] Date of Service: 04/10/2016 12:45 PM Medical Record Patient Account Number: [id number] Number: Treating RN: [name] 1951-03-03 (65 y.o. Other Clinician: Date of Birth/Sex: Female) Treating Roberto Hlavaty Primary Care [name]/Extender: [name], [place] [name]: Referring [name]: LADA, [name] Weeks in Treatment: 20 Cardiovascular Pedal pulses palpable and strong bilaterally.. Notes Wound exam; the remaining areas on the left leg. This will need to be continued to be dressed and wrapped however she can go to get her graded pressure stockings today for the right leg and then bring the left leg one for next week. She should be able to be discharged by then Electronic Signature(s) Signed: 04/10/2016 4:39:00 PM By: [name] Entered By: [name] on 04/10/2016 13:56:45 [name] ([id number]) -------------------------------------------------------------------------------- [name] Orders Details Patient Name: [name] Date of Service: 04/10/2016 12:45 PM Medical Record Patient Account Number:  [id number] Number: Treating RN: [name] [birth year] (65 y.o. Other Clinician: Date of Birth/Sex: Female) Treating Serita Degroote Primary Care [name]/Extender: [name], [place] [name]: Referring [name]: LADA, [name] Weeks in Treatment: 67 Verbal / Phone Orders: Yes Clinician: Dorthy, Joanna Read Back and Verified: Yes Diagnosis Coding Wound Cleansing Wound #5 Left,Distal,Lateral Lower Leg o Cleanse wound with mild soap and water - HHRN to wash legs with soap and water o No tub bath. Anesthetic Wound #5 Left,Distal,Lateral Lower Leg o Topical Lidocaine 4% cream applied to wound bed prior to debridement - in clinic only Primary Wound Dressing Wound #5 Left,Distal,Lateral Lower Leg o Prisma Ag - or collagen with silver equivalent - silver alginate is not collagen Secondary Dressing Wound #5 Left,Distal,Lateral Lower Leg o ABD pad o Dry Gauze Dressing Change Frequency Wound #5 Left,Distal,Lateral Lower Leg o Three times weekly - [facility] to change Monday and Friday Pt comes to clinic Wednesday Follow-up Appointments Wound #5 Left,Distal,Lateral Lower Leg o Return Appointment in 1 week. Edema Control Wound #5 Left,Distal,Lateral Lower Leg o 4 Layer Compression System - Bilateral - use 1 layer of unna wrap to anchor at the top o Patient to wear own compression stockings - HHRN please order compression hose  for patient 20-19mmHg LASHAYLA, EURE. ([id number]) o Elevate legs to the level of the heart and pump ankles as often as possible Additional Orders / Instructions Wound #5 Left,Distal,Lateral Lower Leg o Stop Smoking o Increase protein intake. Home Health Wound #5 Left,Distal,Lateral Lower Leg o Continue Home Health Visits - Advanced---[facility] to change wraps Monday and Friday *********[facility] please order compression hose for patient 20-24mmHg********* o Home Health Nurse may visit PRN to address patientos wound care needs. o  FACE TO FACE ENCOUNTER: MEDICARE and MEDICAID PATIENTS: I certify that this patient is under my care and that I had a face-to-face encounter that meets the [name] face-to-face encounter requirements with this patient on this date. The encounter with the patient was in whole or in part for the following MEDICAL CONDITION: (primary reason for [place]) MEDICAL NECESSITY: I certify, that based on my findings, NURSING services are a medically necessary home health service. HOME BOUND STATUS: I certify that my clinical findings support that this patient is homebound (i.e., Due to illness or injury, pt requires aid of supportive devices such as crutches, cane, wheelchairs, walkers, the use of special transportation or the assistance of another person to leave their place of residence. There is a normal inability to leave the home and doing so requires considerable and taxing effort. Other absences are for medical reasons / religious services and are infrequent or of short duration when for other reasons). o If current dressing causes regression in wound condition, may D/C ordered dressing product/s and apply Normal Saline Moist Dressing daily until next [place] / Other [name] appointment. [place] of regression in wound condition at [phone]. o Please direct any NON-WOUND related issues/requests for orders to patient's Primary Care [name] Electronic Signature(s) Signed: 04/10/2016 4:39:00 PM By: [name] Signed: 04/10/2016 6:00:24 PM By: [name] Entered By: [name] on 04/10/2016 13:15:07 [name] -------------------------------------------------------------------------------- Problem List Details Patient Name: [name] Date of Service: 04/10/2016 12:45 PM Medical Record Patient Account Number: [id number] Number: Treating RN: [name] 10-Aug-1950 (65 y.o. 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 11/20/2015 Yes exposed L97.512 Non-pressure chronic ulcer of other part of right foot with 11/20/2015 Yes fat layer exposed L97.222 Non-pressure chronic ulcer of left calf with fat layer 11/20/2015 Yes exposed G35 Multiple sclerosis 11/20/2015 Yes E66.01 Morbid (severe) obesity due to excess calories 11/20/2015 Yes F17.218 Nicotine dependence, cigarettes, with other nicotine- 11/20/2015 Yes induced disorders L04.3 Acute lymphadenitis of lower limb 04/03/2016 Yes Inactive Problems Resolved Problems Electronic Signature(s) KIZZY, HURTIG ([id number]) Signed: 04/10/2016 4:39:00 PM By: [name] Entered By: [name] on 04/10/2016 13:53:10 [name] -------------------------------------------------------------------------------- Progress Note Details Patient Name: [name] Date of Service: 04/10/2016 12:45 PM Medical Record Patient Account Number: [id number] Number: Treating RN: [name] 10/17/1950 (65 y.o. Other Clinician: Date of Birth/Sex: Female) Treating Braylan Faul Primary Care [name]/Extender: [name], [place] [name]: Referring [name]: LADA, [name] Weeks in Treatment: 20 Subjective Chief Complaint Information obtained from Patient returns for evaluation of bilateral lower extremity ulcerations History of Present Illness (HPI) The following HPI elements were documented for the patient's wound: Location: left and right lower extremity and dorsum of the right foot Quality: Patient reports experiencing a sharp pain  to affected area(s). Severity: Patient rates the pain to be a 8 out of 10 especially with  palpation Duration: Patient has had the wound for > 6 months prior to seeking treatment at the wound center Timing: Pain in wound is Intermittent in severity but persistent Context: The wound appeared gradually over time Modifying Factors: Other treatment(s) tried include: symptomatic treatment as advised by her PCP Associated Signs and Symptoms: Patient reports having increase swelling iin her bilateral lower extremities 65 year old with a past medical history significant for [name], urinary incontinence, and obesity. She has been seen in the wound clinic before for lower extremity ulcerations treated with compression therapy. she is also known to have hypertension, peripheral vascular disease, COPD, obstructive sleep apnea, lumbar radiculopathy, kyphoscoliosis, urinary issues and tobacco abuse. Smokes a packet of cigarettes a day was recently seen at the [facility] for swelling of her legs and feet with a ulceration on the dorsum of the right foot which has been there for about 6 months. she was recently in the ER about a month ago where she was seen for shortness of breath and swelling of the legs and a chest x-ray was within normal limits had an increase in her BNP and was given Lasix and put on doxycycline for a mild cellulitis and possible UTI. Wounds aresmaller today 11/13/15. Debrided and will continue Santyl. 12/21/2015 -- she was admitted to the hospital overnight on 12/18/2015 and diagnosed with urinary retention and cellulitis of the left lower leg. is past to take clindamycin and use Santyl for her wounds. 01/15/2016 -- come back to see [place] for almost a month and continues to be noncompliant with her dressings 01/30/16 patient presents today for a follow-up visit concerning her ongoing bilateral lower extremity wounds. We have not seen her for the past 2 weeks although we are supposed to be seen her for weekly visits. She currently has a Foley catheter. She tells me that  the wounds are intensely painful especially with pressure and palpation at this point in time. Fortunately she is having no interval signs or symptoms of systemic infection but unfortunately the wounds have not improved dramatically since we last saw her. She does [name], [name] H. ([id number]) have home health coming to take care of her as well. She is currently not in any compression wraps. 02/06/16 ON evaluation today patient continues to experience discomfort regarding her bilateral lower extremity ulcers. She has continued to tolerate the dressing changes at this point in time and continues to have a Foley catheter as well. Fortunately she is back this week in the past she has been somewhat noncompliant with follow-ups I'm glad to see her today. She tells me that her pain level varies but can be as high as a 7 out of 10 with manipulation of the wound. She tells me that she used to be on oxycodone which was managed by the pain clinic although she is no longer on that as she tells me that she was actually smoking marijuana at the time and when this was found out they discontinued her pain medication. She no longer is taking anything pain medication wise and she also does not smoke cigarettes nor marijuana at this point. 02/12/16; this is a patient I don't believe I have previously seen. She has multiple sclerosis. She has 4 punched-out areas on the anterior lateral left leg 2 areas on the right these are all in the same condition. Reasonably small [dime size] wounds each was some depth. Surface  of these wounds does not look particularly healthy as there is adherent slough. There is no evidence of surrounding infection or inflammation. ABIs in this clinic were 0.87 on the right and 0.81 on the left. She is listed as having PAD and is a smoker. Not a diabetic 02/20/16 patient I gave antibiotics to last week for erythema around both wound sites on the left lateral leg and right lateral leg. This  appears to be a lot better. One of the areas on the left leg is healed however she still has 3 punched-out open areas on the left lateral calf and one on the right lateral calf and one on the dorsal foot. She is an ex-smoker quitting 1 month ago. ABIs in this clinic were 0.87 on the right 0.81 on the left 03/12/16; this is a patient I really don't have a good sense of. It would appear for the first 5 or 6 weeks of her stay in this clinic she was cared for by [name]. She appeared on my schedule in mid October and I saw her twice. She has not been here however in 3 weeks. She has advanced Wound Care at home where she lives with her husband. She has multiple sclerosis. I saw her the first time she had 4 punched-out small wounds on the anterior lateral left leg it appears that she is now down to 2. She also had wounds on the lateral and medial aspect of her right leg which were also small and punched out. The only one that remains is on the right lateral. Because of the nature of her wound I went ahead and ordered formal arterial studies this showed an ABI of 1 on the right and one on the left. TBI of 1.4 on the right and 0.79 on the left. She had normal waveforms. She was in the emergency room on 02/28/16; there is a noted edema of her left leg after a fall. She had a duplex ultrasound that showed no evidence for an acute DVT from the left groin to the popliteal fossa. The study was limited in the calf veins due to edema. Also noticeable that she had a left inguinal enlarged lymph node up to 6.2 cm. She also had a left knee x-ray that showed no acute findings and a right foot x-ray that showed soft tissue swelling but no radiographic evidence of osteomyelitis. The patient does stop smoking 03-19-16 [name] presents today for evaluation of her bilateral lower extremity ulcerations. She states that she has not smoked in several weeks. She denies any pain or discomfort to bilateral lower  extremities, tolerating compression therapy as ordered. 03/26/16; I have not seen this patient in 2 weeks however she has done very well with improvements in the areas in question. After we obtain normal arterial studies, increasing the 4-layer compression really seems to look done a nice job here. She has one open area on the left lateral leg and one on the medial right leg. These have filled in nicely and are now superficial wounds that showed epithelialized. I note the left inguinal lymph node at 6.2 cm. I may need to refer this back to the patient's primary doctor. 04/03/16; patient has 2 remaining wounds 1 on the left lateral leg and one on the right medial leg. Both of filled in nicely since we are able to increase her from a 3 layer to a 4 layer compression. I am also following up on the lymph node notable on her left  leg DVT rule out in November 04/10/16; area on the right lateral leg is healed. The area on the left leg is still open but looks considerably better with healthy granulation and less wound area. [name], [name] ([id number]) Objective Constitutional Vitals Time Taken: 12:51 PM, Height: 63 in, Weight: 270 lbs, BMI: 47.8, Temperature: 98.4 F, Pulse: 79 bpm, Respiratory Rate: 20 breaths/min, Blood Pressure: 123/82 mmHg. Cardiovascular Pedal pulses palpable and strong bilaterally.. General Notes: Wound exam; the remaining areas on the left leg. This will need to be continued to be dressed and wrapped however she can go to get her graded pressure stockings today for the right leg and then bring the left leg one for next week. She should be able to be discharged by then Integumentary (Hair, Skin) Wound #3 status is Healed - Epithelialized. Original cause of wound was Trauma. The wound is located on the Right Lower Leg. The wound measures 0cm length x 0cm width x 0cm depth; 0cm^2 area and 0cm^3 volume. The wound is limited to skin breakdown. There is no tunneling or undermining  noted. There is a large amount of serous drainage noted. The wound margin is flat and intact. There is no granulation within the wound bed. There is no necrotic tissue within the wound bed. The periwound skin appearance exhibited: Erythema. The periwound skin appearance did not exhibit: Callus, Crepitus, Excoriation, Fluctuance, Friable, Induration, Localized Edema, Rash, Scarring, Dry/Scaly, Maceration, Moist, Atrophie Blanche, Cyanosis, Ecchymosis, Hemosiderin Staining, Mottled, Pallor, Rubor. The surrounding wound skin color is noted with erythema which is circumferential. Periwound temperature was noted as No Abnormality. The periwound has tenderness on palpation. Wound #5 status is Open. Original cause of wound was Gradually Appeared. The wound is located on the Left,Distal,Lateral Lower Leg. The wound measures 0.4cm length x 0.5cm width x 0.1cm depth; 0.157cm^2 area and 0.016cm^3 volume. The wound is limited to skin breakdown. There is no tunneling or undermining noted. There is a large amount of serous drainage noted. The wound margin is flat and intact. There is large (67-100%) pink granulation within the wound bed. There is a small (1-33%) amount of necrotic tissue within the wound bed including Adherent Slough. The periwound skin appearance exhibited: Moist, Erythema. The periwound skin appearance did not exhibit: Callus, Crepitus, Excoriation, Fluctuance, Friable, Induration, Localized Edema, Rash, Scarring, Dry/Scaly, Maceration, Atrophie Blanche, Cyanosis, Ecchymosis, Hemosiderin Staining, Mottled, Pallor, Rubor. The surrounding wound skin color is noted with erythema which is circumferential. Periwound temperature was noted as No Abnormality. The periwound has tenderness on palpation. Assessment [name], [name] ([id number]) Active Problems ICD-10 [phone] - Non-pressure chronic ulcer of right calf with fat layer exposed L97.512 - Non-pressure chronic ulcer of other part of right  foot with fat layer exposed L97.222 - Non-pressure chronic ulcer of left calf with fat layer exposed G35 - Multiple sclerosis E66.01 - Morbid (severe) obesity due to excess calories F17.218 - Nicotine dependence, cigarettes, with other nicotine-induced disorders L04.3 - Acute lymphadenitis of lower limb Plan Wound Cleansing: Wound #5 Left,Distal,Lateral Lower Leg: Cleanse wound with mild soap and water - HHRN to wash legs with soap and water No tub bath. Anesthetic: Wound #5 Left,Distal,Lateral Lower Leg: Topical Lidocaine 4% cream applied to wound bed prior to debridement - in clinic only Primary Wound Dressing: Wound #5 Left,Distal,Lateral Lower Leg: Prisma Ag - or collagen with silver equivalent - silver alginate is not collagen Secondary Dressing: Wound #5 Left,Distal,Lateral Lower Leg: ABD pad Dry Gauze Dressing Change Frequency: Wound #5 Left,Distal,Lateral Lower Leg: Three  times weekly - [facility] to change Monday and Friday Pt comes to clinic Wednesday Follow-up Appointments: Wound #5 Left,Distal,Lateral Lower Leg: Return Appointment in 1 week. Edema Control: Wound #5 Left,Distal,Lateral Lower Leg: 4 Layer Compression System - Bilateral - use 1 layer of unna wrap to anchor at the top Patient to wear own compression stockings - HHRN please order compression hose for patient 20- 18mmHg Elevate legs to the level of the heart and pump ankles as often as possible Additional Orders / Instructions: Wound #5 Left,Distal,Lateral Lower Leg: Stop Smoking Increase protein intake. Home Health: Wound #5 Left,Distal,Lateral Lower Leg: [name], [name] ([id number]) [place] Visits - [facility]---[facility] to change wraps Monday and Friday *********[facility] please order compression hose for patient 20-56mmHg********* Home Health Nurse may visit PRN to address patient s wound care needs. FACE TO FACE ENCOUNTER: MEDICARE and MEDICAID PATIENTS: I certify that this patient is under my care and  that I had a face-to-face encounter that meets the [name] face-to-face encounter requirements with this patient on this date. The encounter with the patient was in whole or in part for the following MEDICAL CONDITION: (primary reason for [place]) MEDICAL NECESSITY: I certify, that based on my findings, NURSING services are a medically necessary home health service. HOME BOUND STATUS: I certify that my clinical findings support that this patient is homebound (i.e., Due to illness or injury, pt requires aid of supportive devices such as crutches, cane, wheelchairs, walkers, the use of special transportation or the assistance of another person to leave their place of residence. There is a normal inability to leave the home and doing so requires considerable and taxing effort. Other absences are for medical reasons / religious services and are infrequent or of short duration when for other reasons). If current dressing causes regression in wound condition, may D/C ordered dressing product/s and apply Normal Saline Moist Dressing daily until next [place] / Other [name] appointment. [place] of regression in wound condition at [phone]. Please direct any NON-WOUND related issues/requests for orders to patient's Primary Care [name] Continue with prisma with a 4 layer wrap to left leg hopefull left leg will be healed next wk own 20-73mmg stocking to healed right leg Electronic Signature(s) Signed: 04/10/2016 4:39:00 PM By: [name] Entered By: [name] on 04/10/2016 13:58:58 [name] ([id number]) -------------------------------------------------------------------------------- SuperBill Details Patient Name: [name] Date of Service: 04/10/2016 Medical Record Patient Account Number: [id number] Number: Treating RN: [name] [birth year] (65 y.o. Other Clinician: Date of Birth/Sex: Female) Treating Desta Bujak,  Rockey Guarino Primary Care [name]/Extender: [name], [place] [name]: Weeks in Treatment: 20 Referring [name]: LADA, [place] Diagnosis Coding ICD-10 Codes Code Description [phone] Non-pressure chronic ulcer of right calf with fat layer exposed L97.512 Non-pressure chronic ulcer of other part of right foot with fat layer exposed L97.222 Non-pressure chronic ulcer of left calf with fat layer exposed G35 Multiple sclerosis E66.01 Morbid (severe) obesity due to excess calories F17.218 Nicotine dependence, cigarettes, with other nicotine-induced disorders L04.3 Acute lymphadenitis of lower limb Facility Procedures CPT4: Description Modifier Quantity Code [id number] (Facility Use Only) [id number] - APPLY [place] [facility] 1 LEG [name] Procedures CPT4 Code: [id number] Description: [id number] - WC PHYS LEVEL 2 - EST PT ICD-10 Description Diagnosis L97.222 Non-pressure chronic ulcer of left calf with fat l Modifier: ayer exposed Quantity: 1 Electronic Signature(s) Signed: 04/10/2016 4:39:00 PM By: [name] Previous Signature: 04/10/2016 1:57:28 PM Version By: [name] Entered By: [name] on  04/10/2016 13:59:30 

## 2016-04-12 DIAGNOSIS — I70232 Atherosclerosis of native arteries of right leg with ulceration of calf: Secondary | ICD-10-CM | POA: Diagnosis not present

## 2016-04-12 DIAGNOSIS — I10 Essential (primary) hypertension: Secondary | ICD-10-CM | POA: Diagnosis not present

## 2016-04-12 DIAGNOSIS — L97419 Non-pressure chronic ulcer of right heel and midfoot with unspecified severity: Secondary | ICD-10-CM | POA: Diagnosis not present

## 2016-04-12 DIAGNOSIS — G35 Multiple sclerosis: Secondary | ICD-10-CM | POA: Diagnosis not present

## 2016-04-12 DIAGNOSIS — L97929 Non-pressure chronic ulcer of unspecified part of left lower leg with unspecified severity: Secondary | ICD-10-CM | POA: Diagnosis not present

## 2016-04-12 DIAGNOSIS — L03116 Cellulitis of left lower limb: Secondary | ICD-10-CM | POA: Diagnosis not present

## 2016-04-12 DIAGNOSIS — R339 Retention of urine, unspecified: Secondary | ICD-10-CM | POA: Diagnosis not present

## 2016-04-12 DIAGNOSIS — I70234 Atherosclerosis of native arteries of right leg with ulceration of heel and midfoot: Secondary | ICD-10-CM | POA: Diagnosis not present

## 2016-04-12 DIAGNOSIS — I70249 Atherosclerosis of native arteries of left leg with ulceration of unspecified site: Secondary | ICD-10-CM | POA: Diagnosis not present

## 2016-04-12 DIAGNOSIS — Z792 Long term (current) use of antibiotics: Secondary | ICD-10-CM | POA: Diagnosis not present

## 2016-04-12 DIAGNOSIS — L97219 Non-pressure chronic ulcer of right calf with unspecified severity: Secondary | ICD-10-CM | POA: Diagnosis not present

## 2016-04-12 DIAGNOSIS — J449 Chronic obstructive pulmonary disease, unspecified: Secondary | ICD-10-CM | POA: Diagnosis not present

## 2016-04-15 DIAGNOSIS — I70234 Atherosclerosis of native arteries of right leg with ulceration of heel and midfoot: Secondary | ICD-10-CM | POA: Diagnosis not present

## 2016-04-15 DIAGNOSIS — I70249 Atherosclerosis of native arteries of left leg with ulceration of unspecified site: Secondary | ICD-10-CM | POA: Diagnosis not present

## 2016-04-15 DIAGNOSIS — I10 Essential (primary) hypertension: Secondary | ICD-10-CM | POA: Diagnosis not present

## 2016-04-15 DIAGNOSIS — R339 Retention of urine, unspecified: Secondary | ICD-10-CM | POA: Diagnosis not present

## 2016-04-15 DIAGNOSIS — J449 Chronic obstructive pulmonary disease, unspecified: Secondary | ICD-10-CM | POA: Diagnosis not present

## 2016-04-15 DIAGNOSIS — L97219 Non-pressure chronic ulcer of right calf with unspecified severity: Secondary | ICD-10-CM | POA: Diagnosis not present

## 2016-04-15 DIAGNOSIS — G35 Multiple sclerosis: Secondary | ICD-10-CM | POA: Diagnosis not present

## 2016-04-15 DIAGNOSIS — I70232 Atherosclerosis of native arteries of right leg with ulceration of calf: Secondary | ICD-10-CM | POA: Diagnosis not present

## 2016-04-15 DIAGNOSIS — L03116 Cellulitis of left lower limb: Secondary | ICD-10-CM | POA: Diagnosis not present

## 2016-04-15 DIAGNOSIS — L97419 Non-pressure chronic ulcer of right heel and midfoot with unspecified severity: Secondary | ICD-10-CM | POA: Diagnosis not present

## 2016-04-15 DIAGNOSIS — L97929 Non-pressure chronic ulcer of unspecified part of left lower leg with unspecified severity: Secondary | ICD-10-CM | POA: Diagnosis not present

## 2016-04-15 DIAGNOSIS — Z792 Long term (current) use of antibiotics: Secondary | ICD-10-CM | POA: Diagnosis not present

## 2016-04-16 DIAGNOSIS — I10 Essential (primary) hypertension: Secondary | ICD-10-CM | POA: Diagnosis not present

## 2016-04-16 DIAGNOSIS — L97419 Non-pressure chronic ulcer of right heel and midfoot with unspecified severity: Secondary | ICD-10-CM | POA: Diagnosis not present

## 2016-04-16 DIAGNOSIS — I70249 Atherosclerosis of native arteries of left leg with ulceration of unspecified site: Secondary | ICD-10-CM | POA: Diagnosis not present

## 2016-04-16 DIAGNOSIS — I70232 Atherosclerosis of native arteries of right leg with ulceration of calf: Secondary | ICD-10-CM | POA: Diagnosis not present

## 2016-04-16 DIAGNOSIS — L03116 Cellulitis of left lower limb: Secondary | ICD-10-CM | POA: Diagnosis not present

## 2016-04-16 DIAGNOSIS — L97929 Non-pressure chronic ulcer of unspecified part of left lower leg with unspecified severity: Secondary | ICD-10-CM | POA: Diagnosis not present

## 2016-04-16 DIAGNOSIS — L97219 Non-pressure chronic ulcer of right calf with unspecified severity: Secondary | ICD-10-CM | POA: Diagnosis not present

## 2016-04-16 DIAGNOSIS — Z792 Long term (current) use of antibiotics: Secondary | ICD-10-CM | POA: Diagnosis not present

## 2016-04-16 DIAGNOSIS — G35 Multiple sclerosis: Secondary | ICD-10-CM | POA: Diagnosis not present

## 2016-04-16 DIAGNOSIS — J449 Chronic obstructive pulmonary disease, unspecified: Secondary | ICD-10-CM | POA: Diagnosis not present

## 2016-04-16 DIAGNOSIS — I70234 Atherosclerosis of native arteries of right leg with ulceration of heel and midfoot: Secondary | ICD-10-CM | POA: Diagnosis not present

## 2016-04-16 DIAGNOSIS — R339 Retention of urine, unspecified: Secondary | ICD-10-CM | POA: Diagnosis not present

## 2016-04-17 ENCOUNTER — Encounter: Payer: Medicare Other | Admitting: Internal Medicine

## 2016-04-17 DIAGNOSIS — I739 Peripheral vascular disease, unspecified: Secondary | ICD-10-CM | POA: Diagnosis not present

## 2016-04-17 DIAGNOSIS — Z79899 Other long term (current) drug therapy: Secondary | ICD-10-CM | POA: Diagnosis not present

## 2016-04-17 DIAGNOSIS — L97222 Non-pressure chronic ulcer of left calf with fat layer exposed: Secondary | ICD-10-CM | POA: Diagnosis not present

## 2016-04-17 DIAGNOSIS — L043 Acute lymphadenitis of lower limb: Secondary | ICD-10-CM | POA: Diagnosis not present

## 2016-04-17 DIAGNOSIS — G35 Multiple sclerosis: Secondary | ICD-10-CM | POA: Diagnosis not present

## 2016-04-17 DIAGNOSIS — L97512 Non-pressure chronic ulcer of other part of right foot with fat layer exposed: Secondary | ICD-10-CM | POA: Diagnosis not present

## 2016-04-17 DIAGNOSIS — G4733 Obstructive sleep apnea (adult) (pediatric): Secondary | ICD-10-CM | POA: Diagnosis not present

## 2016-04-17 DIAGNOSIS — I872 Venous insufficiency (chronic) (peripheral): Secondary | ICD-10-CM | POA: Diagnosis not present

## 2016-04-17 DIAGNOSIS — L97212 Non-pressure chronic ulcer of right calf with fat layer exposed: Secondary | ICD-10-CM | POA: Diagnosis not present

## 2016-04-17 DIAGNOSIS — I10 Essential (primary) hypertension: Secondary | ICD-10-CM | POA: Diagnosis not present

## 2016-04-17 DIAGNOSIS — J449 Chronic obstructive pulmonary disease, unspecified: Secondary | ICD-10-CM | POA: Diagnosis not present

## 2016-04-19 ENCOUNTER — Telehealth: Payer: Self-pay

## 2016-04-19 DIAGNOSIS — I70232 Atherosclerosis of native arteries of right leg with ulceration of calf: Secondary | ICD-10-CM | POA: Diagnosis not present

## 2016-04-19 DIAGNOSIS — G35 Multiple sclerosis: Secondary | ICD-10-CM | POA: Diagnosis not present

## 2016-04-19 DIAGNOSIS — J449 Chronic obstructive pulmonary disease, unspecified: Secondary | ICD-10-CM | POA: Diagnosis not present

## 2016-04-19 DIAGNOSIS — Z792 Long term (current) use of antibiotics: Secondary | ICD-10-CM | POA: Diagnosis not present

## 2016-04-19 DIAGNOSIS — I10 Essential (primary) hypertension: Secondary | ICD-10-CM | POA: Diagnosis not present

## 2016-04-19 DIAGNOSIS — L03116 Cellulitis of left lower limb: Secondary | ICD-10-CM | POA: Diagnosis not present

## 2016-04-19 DIAGNOSIS — I70249 Atherosclerosis of native arteries of left leg with ulceration of unspecified site: Secondary | ICD-10-CM | POA: Diagnosis not present

## 2016-04-19 DIAGNOSIS — L97419 Non-pressure chronic ulcer of right heel and midfoot with unspecified severity: Secondary | ICD-10-CM | POA: Diagnosis not present

## 2016-04-19 DIAGNOSIS — L97219 Non-pressure chronic ulcer of right calf with unspecified severity: Secondary | ICD-10-CM | POA: Diagnosis not present

## 2016-04-19 DIAGNOSIS — I70234 Atherosclerosis of native arteries of right leg with ulceration of heel and midfoot: Secondary | ICD-10-CM | POA: Diagnosis not present

## 2016-04-19 DIAGNOSIS — R339 Retention of urine, unspecified: Secondary | ICD-10-CM | POA: Diagnosis not present

## 2016-04-19 DIAGNOSIS — L97929 Non-pressure chronic ulcer of unspecified part of left lower leg with unspecified severity: Secondary | ICD-10-CM | POA: Diagnosis not present

## 2016-04-19 NOTE — Telephone Encounter (Signed)
Refill request sent form [facility] pharmacy that Patient is asking to be on Triamterene 37.5mg / HCTZ 25mg . The medicine was discontinued on September 2017.

## 2016-04-19 NOTE — Progress Notes (Signed)
MEIRA, KELP ([id number]) Visit Report for 04/17/2016 Arrival Information Details Patient Name: [name], [name] Date of Service: 04/17/2016 1:15 PM Medical Record Patient Account Number: [id number] Number: Treating RN: Cornell Barman [birth year] (65 y.o. Other Clinician: Date of Birth/Sex: Female) Treating ROBSON, [place] Primary Care Physician: LADA, MELINDA Physician/Extender: G Referring Physician: LADA, MELINDA Weeks in Treatment: 21 Visit Information History Since Last Visit Added or deleted any medications: No Patient Arrived: Wheel Chair Any new allergies or adverse reactions: No Arrival Time: 14:44 Had a fall or experienced change in No activities of daily living that may affect Accompanied By: self risk of falls: Transfer Assistance: Manual Signs or symptoms of abuse/neglect since last No Patient Requires Transmission-Based No visito Precautions: Hospitalized since last visit: No Patient Has Alerts: Yes Has Dressing in Place as Prescribed: Yes Has Compression in Place as Prescribed: Yes Pain Present Now: No Electronic Signature(s) Signed: 04/18/2016 6:03:08 PM By: Gretta Cool, RN, BSN, Kim RN, BSN Entered By: Gretta Cool, RN, BSN, Kim on 04/17/2016 14:49:44 Breslin, [name] ([id number]) -------------------------------------------------------------------------------- Encounter Discharge Information Details Patient Name: [name] Date of Service: 04/17/2016 1:15 PM Medical Record Patient Account Number: [id number] Number: Treating RN: Cornell Barman Mar 19, 1951 (65 y.o. Other Clinician: Date of Birth/Sex: Female) Treating ROBSON, [place] Primary Care Physician: LADA, MELINDA Physician/Extender: G Referring Physician: LADA, MELINDA Weeks in Treatment: 57 Encounter Discharge Information Items Discharge Pain Level: 0 Discharge Condition: Stable Ambulatory Status: Ambulatory Discharge Destination: Home Transportation: Private Auto Accompanied By:  self Schedule Follow-up Appointment: Yes Medication Reconciliation completed and provided to Patient/Care Yes Indiyah Paone: Provided on Clinical Summary of Care: 04/17/2016 Form Type Recipient Paper Patient BW Electronic Signature(s) Signed: 04/18/2016 6:03:08 PM By: Gretta Cool RN, BSN, Kim RN, BSN Previous Signature: 04/17/2016 3:34:25 PM Version By: Ruthine Dose Entered By: Gretta Cool RN, BSN, Kim on 04/17/2016 15:35:10 [name] -------------------------------------------------------------------------------- Lower Extremity Assessment Details Patient Name: [name] Date of Service: 04/17/2016 1:15 PM Medical Record Patient Account Number: [id number] Number: Treating RN: Cornell Barman February 23, 1951 (65 y.o. Other Clinician: Date of Birth/Sex: Female) Treating ROBSON, [place] Primary Care Physician: LADA, MELINDA Physician/Extender: G Referring Physician: LADA, MELINDA Weeks in Treatment: 21 Edema Assessment Assessed: [Left: No] [Right: No] E[Left: dema] [Right: :] Calf Left: Right: Point of Measurement: 33 cm From Medial Instep 35.4 cm cm Ankle Left: Right: Point of Measurement: 11 cm From Medial Instep 20.5 cm cm Vascular Assessment Pulses: Dorsalis Pedis Palpable: [Left:Yes] [Right:Yes] Posterior Tibial Popliteal Doppler Audible: [Left:Yes] Extremity colors, hair growth, and conditions: Extremity Color: [Left:Normal] [Right:Normal] Hair Growth on Extremity: [Right:No] Temperature of Extremity: [Left:Warm] [Right:Cool] Capillary Refill: [Left:> 3 seconds] [Right:< 3 seconds] Dependent Rubor: [Left:No] [Right:No] Blanched when Elevated: [Left:No] [Right:No] Lipodermatosclerosis: [Left:No] Toe Nail Assessment Left: Right: Thick: Yes Yes Discolored: Yes Yes Deformed: Yes Yes Improper Length and Hygiene: Yes Yes MILEI, MONESTIME ([id number]) Electronic Signature(s) Signed: 04/18/2016 6:03:08 PM By: Gretta Cool, RN, BSN, Kim RN, BSN Entered By:  Gretta Cool, RN, BSN, Kim on 04/17/2016 14:56:24 Goetzke, [name] ([id number]) -------------------------------------------------------------------------------- Multi Wound Chart Details Patient Name: [name] Date of Service: 04/17/2016 1:15 PM Medical Record Patient Account Number: [id number] Number: Treating RN: Cornell Barman [birth year] (65 y.o. Other Clinician: Date of Birth/Sex: Female) Treating ROBSON, MICHAEL Primary Care Physician: LADA, MELINDA Physician/Extender: G Referring Physician: LADA, MELINDA Weeks in Treatment: 21 Vital Signs Height(in): 63 Pulse(bpm): 69 Weight(lbs): 270 Blood Pressure 142/82 (mmHg): Body Mass Index(BMI): 48 Temperature(F): 98.0 Respiratory Rate 20 (breaths/min): Photos: [5:No Photos] [8:No Photos] [N/A:N/A] Wound  Location: [5:Left Lower Leg - Lateral, Right Lower Leg - Lateral N/A Distal] Wounding Event: [5:Gradually Appeared] [8:Gradually Appeared] [N/A:N/A] Primary Etiology: [5:Venous Leg Ulcer] [8:Venous Leg Ulcer] [N/A:N/A] Comorbid History: [5:Cataracts, Chronic Obstructive Pulmonary Disease (COPD), Sleep Disease (COPD), Sleep Apnea, Hypertension, Osteoarthritis, Neuropathy Osteoarthritis, Neuropathy] [8:Cataracts, Chronic Obstructive Pulmonary Apnea, Hypertension,]  [N/A:N/A] Date Acquired: [5:12/21/2015] [8:04/12/2016] [N/A:N/A] Weeks of Treatment: [5:16] [8:0] [N/A:N/A] Wound Status: [5:Open] [8:Open] [N/A:N/A] Measurements L x W x D 0.3x0.4x0.1 [8:0.7x0.9x0.1] [N/A:N/A] (cm) Area (cm) : [5:0.094] [8:0.495] [N/A:N/A] Volume (cm) : [5:0.009] [8:0.049] [N/A:N/A] % Reduction in Area: [5:82.90%] [8:N/A] [N/A:N/A] % Reduction in Volume: 83.60% [8:N/A] [N/A:N/A] Classification: [5:Partial Thickness] [8:Full Thickness Without Exposed Support Structures] [N/A:N/A] Exudate Amount: [5:Medium] [8:Medium] [N/A:N/A] Exudate Type: [5:Serous] [8:Serous] [N/A:N/A] Exudate Color: [5:amber] [8:amber] [N/A:N/A] Wound Margin: [5:Flat and  Intact] [8:Flat and Intact] [N/A:N/A] Granulation Amount: [5:Large (67-100%)] [8:None Present (0%)] [N/A:N/A] Granulation Quality: [5:Pink] [8:N/A] [N/A:N/A] Necrotic Amount: Small (1-33%) Large (67-100%) N/A Necrotic Tissue: Adherent Slough Eschar N/A Exposed Structures: Fascia: No Fat: Yes N/A Fat: No Fascia: No Tendon: No Tendon: No Muscle: No Muscle: No Joint: No Joint: No Bone: No Bone: No Limited to Skin Breakdown Epithelialization: Small (1-33%) None N/A Debridement: Debridement [id number] N/A N/A [id number]) Pre-procedure 15:12 N/A N/A Verification/Time Out Taken: Pain Control: Other N/A N/A Tissue Debrided: Necrotic/Eschar, N/A N/A Subcutaneous Level: Skin/Subcutaneous N/A N/A Tissue Debridement Area (sq 0.12 N/A N/A cm): Instrument: Curette N/A N/A Bleeding: Minimum N/A N/A Hemostasis Achieved: Pressure N/A N/A Procedural Pain: 0 N/A N/A Post Procedural Pain: 0 N/A N/A Debridement Treatment Procedure was tolerated N/A N/A Response: well Post Debridement 0.9x0.7x0.1 N/A N/A Measurements L x W x D (cm) Post Debridement 0.049 N/A N/A Volume: (cm) Periwound Skin Texture: Induration: Yes Edema: No N/A Edema: No Excoriation: No Excoriation: No Induration: No Callus: No Callus: No Crepitus: No Crepitus: No Fluctuance: No Fluctuance: No Friable: No Friable: No Rash: No Rash: No Scarring: No Scarring: No Periwound Skin Dry/Scaly: Yes Dry/Scaly: Yes N/A Moisture: Maceration: No Maceration: No Moist: No Moist: No Periwound Skin Color: Atrophie Blanche: No Ecchymosis: Yes N/A Cyanosis: No Atrophie Blanche: No Ecchymosis: No Cyanosis: No Hermida, Jocelin H. ([id number]) Erythema: No Erythema: No Hemosiderin Staining: No Hemosiderin Staining: No Mottled: No Mottled: No Pallor: No Pallor: No Rubor: No Rubor: No Temperature: No Abnormality N/A N/A Tenderness on Yes No N/A Palpation: Wound Preparation: Ulcer Cleansing: Other: Ulcer Cleansing:  N/A soap and water Rinsed/Irrigated with Saline Topical Anesthetic Applied: Other: lidocaine Topical Anesthetic 4% Applied: Other: lidociane 4% Procedures Performed: Debridement N/A N/A Treatment Notes Wound #5 (Left, Distal, Lateral Lower Leg) 1. Cleansed with: Cleanse wound with antibacterial soap and water 2. Anesthetic Topical Lidocaine 4% cream to wound bed prior to debridement 4. Dressing Applied: Prisma Ag 5. Secondary Dressing Applied ABD Pad 7. Secured with 3 Layer Compression System - Left Lower Extremity Wound #8 (Right, Lateral Lower Leg) 1. Cleansed with: Cleanse wound with antibacterial soap and water 2. Anesthetic Topical Lidocaine 4% cream to wound bed prior to debridement 4. Dressing Applied: Prisma Ag 5. Secondary Dressing Applied ABD Pad 7. Secured with 3 Layer Compression System - Left Lower Extremity Electronic Signature(s) Signed: 04/17/2016 5:40:41 PM By: Linton Ham MD Entered By: Linton Ham on 04/17/2016 16:09:49 MADDY, SCHULZ ([id number]) LYBERTI, AFSHAR ([id number]) -------------------------------------------------------------------------------- Multi-Disciplinary Care Plan Details Patient Name: [name] Date of Service: 04/17/2016 1:15 PM Medical Record Patient Account Number: [id number] Number: Treating RN: Cornell Barman Dec 23, 1950 (65 y.o. Other Clinician: Date of Birth/Sex: Female)  Treating Linton Ham Primary Care Physician: LADA, MELINDA Physician/Extender: G Referring Physician: LADA, MELINDA Weeks in Treatment: 64 Active Inactive Abuse / Safety / Falls / Self Care Management Nursing Diagnoses: Impaired physical mobility Potential for falls Goals: Patient will remain injury free Date Initiated: 11/20/2015 Goal Status: Active Interventions: Assess fall risk on admission and as needed Notes: Nutrition Nursing Diagnoses: Imbalanced nutrition Goals: Patient/caregiver agrees to and verbalizes  understanding of need to use nutritional supplements and/or vitamins as prescribed Date Initiated: 11/20/2015 Goal Status: Active Interventions: Provide education on nutrition Treatment Activities: Education provided on Nutrition : 03/19/2016 Notes: Orientation to the Wound Care Program DEZIRAE, DENTLER ([id number]) Nursing Diagnoses: Knowledge deficit related to the wound healing center program Goals: Patient/caregiver will verbalize understanding of the [facility] Date Initiated: 11/20/2015 Goal Status: Active Interventions: Provide education on orientation to the wound center Notes: Wound/Skin Impairment Nursing Diagnoses: Impaired tissue integrity Goals: Patient will demonstrate a reduced rate of smoking or cessation of smoking Date Initiated: 11/20/2015 Goal Status: Active Ulcer/skin breakdown will heal within 14 weeks Date Initiated: 11/20/2015 Goal Status: Active Interventions: Assess patient/caregiver ability to obtain necessary supplies Provide education on smoking Notes: Electronic Signature(s) Signed: 04/18/2016 6:03:08 PM By: Gretta Cool, RN, BSN, Kim RN, BSN Entered By: Gretta Cool, RN, BSN, Kim on 04/17/2016 15:10:10 [name] ([id number]) -------------------------------------------------------------------------------- Pain Assessment Details Patient Name: [name] Date of Service: 04/17/2016 1:15 PM Medical Record Patient Account Number: [id number] Number: Treating RN: Cornell Barman [birth year] (65 y.o. Other Clinician: Date of Birth/Sex: Female) Treating ROBSON, [place] Primary Care Physician: LADA, MELINDA Physician/Extender: G Referring Physician: LADA, MELINDA Weeks in Treatment: 21 Active Problems Location of Pain Severity and Description of Pain Patient Has Paino No Site Locations With Dressing Change: No Pain Management and Medication Current Pain Management: Notes Topical or injectable lidocaine is offered to patient  for acute pain when surgical debridement is performed. If needed, Patient is instructed to use over the counter pain medication for the following 24-48 hours after debridement. Wound care MDs do not prescribed pain medications. Patient has chronic pain or uncontrolled pain. Patient has been instructed to make an appointment with their Primary Care Physician for pain management. Electronic Signature(s) Signed: 04/18/2016 6:03:08 PM By: Gretta Cool, RN, BSN, Kim RN, BSN Entered By: Gretta Cool, RN, BSN, Kim on 04/17/2016 14:50:00 [name] ([id number]) -------------------------------------------------------------------------------- Patient/Caregiver Education Details Patient Name: [name] Date of Service: 04/17/2016 1:15 PM Medical Record Patient Account Number: [id number] Number: Treating RN: Cornell Barman [birth year] (65 y.o. Other Clinician: Date of Birth/Gender: Female) Treating ROBSON, [place] Primary Care Physician: LADA, MELINDA Physician/Extender: G Referring Physician: LADA, MELINDA Weeks in Treatment: 21 Education Assessment Education Provided To: Patient Education Topics Provided Wound/Skin Impairment: Handouts: Other: continue wound care as prescribed Methods: Explain/Verbal Responses: State content correctly Electronic Signature(s) Signed: 04/18/2016 6:03:08 PM By: Gretta Cool, RN, BSN, Kim RN, BSN Entered By: Gretta Cool, RN, BSN, Kim on 04/17/2016 15:35:30 [name] ([id number]) -------------------------------------------------------------------------------- Wound Assessment Details Patient Name: [name] Date of Service: 04/17/2016 1:15 PM Medical Record Patient Account Number: [id number] Number: Treating RN: Cornell Barman [birth year] (65 y.o. Other Clinician: Date of Birth/Sex: Female) Treating ROBSON, MICHAEL Primary Care Physician: LADA, MELINDA Physician/Extender: G Referring Physician: LADA, MELINDA Weeks in Treatment: 21 Wound  Status Wound Number: 5 Primary Venous Leg Ulcer Etiology: Wound Location: Left Lower Leg - Lateral, Distal Wound Open Wounding Event: Gradually Appeared Status: Date Acquired: 12/21/2015 Comorbid Cataracts, Chronic Obstructive Weeks Of Treatment: 16 History: Pulmonary  Disease (COPD), Sleep Clustered Wound: No Apnea, Hypertension, Osteoarthritis, Neuropathy Photos Photo Uploaded By: Gretta Cool, RN, BSN, Kim on 04/17/2016 16:30:28 Wound Measurements Length: (cm) 0.3 Width: (cm) 0.4 Depth: (cm) 0.1 Area: (cm) 0.094 Volume: (cm) 0.009 % Reduction in Area: 82.9% % Reduction in Volume: 83.6% Epithelialization: Small (1-33%) Tunneling: No Undermining: No Wound Description Classification: Partial Thickness Wound Margin: Flat and Intact Exudate Amount: Medium Exudate Type: Serous Exudate Color: amber Wound Bed Granulation Amount: Large (67-100%) Exposed Structure Granulation Quality: Pink Fascia Exposed: No Necrotic Amount: Small (1-33%) Fat Layer Exposed: No Goldberg, Tayana H. ([id number]) Necrotic Quality: Adherent Slough Tendon Exposed: No Muscle Exposed: No Joint Exposed: No Bone Exposed: No Limited to Skin Breakdown Periwound Skin Texture Texture Color No Abnormalities Noted: No No Abnormalities Noted: No Callus: No Atrophie Blanche: No Crepitus: No Cyanosis: No Excoriation: No Ecchymosis: No Fluctuance: No Erythema: No Friable: No Hemosiderin Staining: No Induration: Yes Mottled: No Localized Edema: No Pallor: No Rash: No Rubor: No Scarring: No Temperature / Pain Moisture Temperature: No Abnormality No Abnormalities Noted: No Tenderness on Palpation: Yes Dry / Scaly: Yes Maceration: No Moist: No Wound Preparation Ulcer Cleansing: Other: soap and water, Topical Anesthetic Applied: Other: lidocaine 4%, Treatment Notes Wound #5 (Left, Distal, Lateral Lower Leg) 1. Cleansed with: Cleanse wound with antibacterial soap and water 2.  Anesthetic Topical Lidocaine 4% cream to wound bed prior to debridement 4. Dressing Applied: Prisma Ag 5. Secondary Dressing Applied ABD Pad 7. Secured with 3 Layer Compression System - Left Lower Extremity Electronic Signature(s) Signed: 04/18/2016 6:03:08 PM By: Gretta Cool, RN, BSN, Kim RN, BSN Entered By: Gretta Cool, RN, BSN, Kim on 04/17/2016 15:02:15 SHAYE, LOISELLE ([id number]) -------------------------------------------------------------------------------- Wound Assessment Details Patient Name: [name] Date of Service: 04/17/2016 1:15 PM Medical Record Patient Account Number: [id number] Number: Treating RN: Cornell Barman April 24, 1951 (65 y.o. Other Clinician: Date of Birth/Sex: Female) Treating ROBSON, MICHAEL Primary Care Physician: LADA, MELINDA Physician/Extender: G Referring Physician: LADA, MELINDA Weeks in Treatment: 21 Wound Status Wound Number: 8 Primary Venous Leg Ulcer Etiology: Wound Location: Right Lower Leg - Lateral Wound Open Wounding Event: Gradually Appeared Status: Date Acquired: 04/12/2016 Comorbid Cataracts, Chronic Obstructive Weeks Of Treatment: 0 History: Pulmonary Disease (COPD), Sleep Clustered Wound: No Apnea, Hypertension, Osteoarthritis, Neuropathy Photos Photo Uploaded By: Gretta Cool, RN, BSN, Kim on 04/17/2016 16:30:28 Wound Measurements Length: (cm) 0.7 Width: (cm) 0.9 Depth: (cm) 0.1 Area: (cm) 0.495 Volume: (cm) 0.049 % Reduction in Area: % Reduction in Volume: Epithelialization: None Tunneling: No Undermining: No Wound Description Full Thickness Without Exposed Foul Odor After Classification: Support Structures Wound Margin: Flat and Intact Exudate Medium Amount: Exudate Type: Serous Exudate Color: amber Cleansing: No Wound Bed Granulation Amount: None Present (0%) Exposed Structure [place], Macenzie H. ([id number]) Necrotic Amount: Large (67-100%) Fascia Exposed: No Necrotic Quality: Eschar Fat Layer Exposed:  Yes Tendon Exposed: No Muscle Exposed: No Joint Exposed: No Bone Exposed: No Periwound Skin Texture Texture Color No Abnormalities Noted: No No Abnormalities Noted: No Callus: No Atrophie Blanche: No Crepitus: No Cyanosis: No Excoriation: No Ecchymosis: Yes Fluctuance: No Erythema: No Friable: No Hemosiderin Staining: No Induration: No Mottled: No Localized Edema: No Pallor: No Rash: No Rubor: No Scarring: No Moisture No Abnormalities Noted: No Dry / Scaly: Yes Maceration: No Moist: No Wound Preparation Ulcer Cleansing: Rinsed/Irrigated with Saline Topical Anesthetic Applied: Other: lidociane 4%, Treatment Notes Wound #8 (Right, Lateral Lower Leg) 1. Cleansed with: Cleanse wound with antibacterial soap and water 2. Anesthetic Topical Lidocaine 4% cream to wound  bed prior to debridement 4. Dressing Applied: Prisma Ag 5. Secondary Dressing Applied ABD Pad 7. Secured with 3 Layer Compression System - Left Lower Extremity Electronic Signature(s) Signed: 04/18/2016 6:03:08 PM By: Gretta Cool, RN, BSN, Kim RN, BSN Entered By: Gretta Cool, RN, BSN, Kim on 04/17/2016 15:01:09 ELLAREE, BARTHELL ([id number]) DAWNELL, POH ([id number]) -------------------------------------------------------------------------------- Vitals Details Patient Name: [name] Date of Service: 04/17/2016 1:15 PM Medical Record Patient Account Number: [id number] Number: Treating RN: Cornell Barman Sep 27, 1950 (65 y.o. Other Clinician: Date of Birth/Sex: Female) Treating ROBSON, [place] Primary Care Physician: LADA, MELINDA Physician/Extender: G Referring Physician: LADA, MELINDA Weeks in Treatment: 21 Vital Signs Time Taken: 14:50 Temperature (F): 98.0 Height (in): 63 Pulse (bpm): 69 Weight (lbs): 270 Respiratory Rate (breaths/min): 20 Body Mass Index (BMI): 47.8 Blood Pressure (mmHg): 142/82 Reference Range: 80 - 120 mg / dl Electronic Signature(s) Signed: 04/18/2016 6:03:08 PM  By: Gretta Cool, RN, BSN, Kim RN, BSN Entered By: Gretta Cool, RN, BSN, Kim on 04/17/2016 14:50:20

## 2016-04-19 NOTE — Telephone Encounter (Signed)
I'm going to deny the triamterene/hctz because it was stopped and she was put on a lower dose of plain hctz A six month supply of the new plain hctz was approved on 02/26/16

## 2016-04-19 NOTE — Progress Notes (Signed)
[name], [name] ([id number]) Visit Report for 04/17/2016 Chief Complaint Document Details Patient Name: [name], [name] Date of Service: 04/17/2016 1:15 PM Medical Record Patient Account Number: [id number] Number: Treating RN: Cornell Barman [birth year] (65 y.o. Other Clinician: Date of Birth/Sex: Female) Treating ROBSON, MICHAEL Primary Care Physician/Extender: Peri Jefferson, [place] Physician: Referring Physician: LADA, MELINDA Weeks in Treatment: 21 Information Obtained from: Patient Chief Complaint returns for evaluation of bilateral lower extremity ulcerations Electronic Signature(s) Signed: 04/17/2016 5:40:41 PM By: Linton Ham MD Entered By: Linton Ham on 04/17/2016 16:10:57 [name] ([id number]) -------------------------------------------------------------------------------- Debridement Details Patient Name: [name] Date of Service: 04/17/2016 1:15 PM Medical Record Patient Account Number: [id number] Number: Treating RN: Cornell Barman [birth year] (65 y.o. Other Clinician: Date of Birth/Sex: Female) Treating ROBSON, MICHAEL Primary Care Physician/Extender: G LADA, [place] Physician: Referring Physician: LADA, MELINDA Weeks in Treatment: 21 Debridement Performed for Wound #5 Left,Distal,Lateral Lower Leg Assessment: Performed By: Physician Ricard Dillon, MD Debridement: Debridement Pre-procedure Yes - 15:12 Verification/Time Out Taken: Start Time: 15:13 Pain Control: Other : lidocaine 4% Level: Skin/Subcutaneous Tissue Total Area Debrided (L x 0.3 (cm) x 0.4 (cm) = 0.12 (cm) W): Tissue and other Viable, Non-Viable, Eschar, Subcutaneous material debrided: Instrument: Curette Bleeding: Minimum Hemostasis Achieved: Pressure End Time: 15:15 Procedural Pain: 0 Post Procedural Pain: 0 Response to Treatment: Procedure was tolerated well Post Debridement Measurements of Total Wound Length: (cm) 0.9 Width: (cm) 0.7 Depth: (cm)  0.1 Volume: (cm) 0.049 Character of Wound/Ulcer Post Requires Further Debridement Debridement: Severity of Tissue Post Debridement: Fat layer exposed Post Procedure Diagnosis Same as Pre-procedure Electronic Signature(s) Signed: 04/17/2016 5:40:41 PM By: Linton Ham MD [name] ([id number]) Signed: 04/18/2016 6:03:08 PM By: Gretta Cool, RN, BSN, Kim RN, BSN Entered By: Linton Ham on 04/17/2016 16:10:31 [name], [name] ([id number]) -------------------------------------------------------------------------------- HPI Details Patient Name: [name] Date of Service: 04/17/2016 1:15 PM Medical Record Patient Account Number: [id number] Number: Treating RN: Cornell Barman Feb 02, 1951 (65 y.o. Other Clinician: Date of Birth/Sex: Female) Treating ROBSON, MICHAEL Primary Care Physician/Extender: Peri Jefferson, [place] Physician: Referring Physician: LADA, MELINDA Weeks in Treatment: 21 History of Present Illness Location: left and right lower extremity and dorsum of the right foot Quality: Patient reports experiencing a sharp pain to affected area(s). Severity: Patient rates the pain to be a 8 out of 10 especially with palpation Duration: Patient has had the wound for > 6 months prior to seeking treatment at the wound center Timing: Pain in wound is Intermittent in severity but persistent Context: The wound appeared gradually over time Modifying Factors: Other treatment(s) tried include: symptomatic treatment as advised by her PCP Associated Signs and Symptoms: Patient reports having increase swelling iin her bilateral lower extremities HPI Description: 65 year old with a past medical history significant for MS, urinary incontinence, and obesity. She has been seen in the wound clinic before for lower extremity ulcerations treated with compression therapy. she is also known to have hypertension, peripheral vascular disease, COPD, obstructive sleep apnea, lumbar  radiculopathy, kyphoscoliosis, urinary issues and tobacco abuse. Smokes a packet of cigarettes a day was recently seen at the [facility] for swelling of her legs and feet with a ulceration on the dorsum of the right foot which has been there for about 6 months. she was recently in the ER about a month ago where she was seen for shortness of breath and swelling of the legs and a chest x-ray was within normal limits had an increase in her BNP and  was given Lasix and put on doxycycline for a mild cellulitis and possible UTI. Wounds aresmaller today 11/13/15. Debrided and will continue Santyl. 12/21/2015 -- she was admitted to the hospital overnight on 12/18/2015 and diagnosed with urinary retention and cellulitis of the left lower leg. is past to take clindamycin and use Santyl for her wounds. 01/15/2016 -- come back to see [place] for almost a month and continues to be noncompliant with her dressings 01/30/16 patient presents today for a follow-up visit concerning her ongoing bilateral lower extremity wounds. We have not seen her for the past 2 weeks although we are supposed to be seen her for weekly visits. She currently has a Foley catheter. She tells me that the wounds are intensely painful especially with pressure and palpation at this point in time. Fortunately she is having no interval signs or symptoms of systemic infection but unfortunately the wounds have not improved dramatically since we last saw her. She does have home health coming to take care of her as well. She is currently not in any compression wraps. 02/06/16 ON evaluation today patient continues to experience discomfort regarding her bilateral lower extremity ulcers. She has continued to tolerate the dressing changes at this point in time and continues to have a Foley catheter as well. Fortunately she is back this week in the past she has been somewhat noncompliant with follow-ups I'm glad to see her today. She tells me  that her pain level varies but can be as [name], [name] H. ([id number]) high as a 7 out of 10 with manipulation of the wound. She tells me that she used to be on oxycodone which was managed by the pain clinic although she is no longer on that as she tells me that she was actually smoking marijuana at the time and when this was found out they discontinued her pain medication. She no longer is taking anything pain medication wise and she also does not smoke cigarettes nor marijuana at this point. 02/12/16; this is a patient I don't believe I have previously seen. She has multiple sclerosis. She has 4 punched-out areas on the anterior lateral left leg 2 areas on the right these are all in the same condition. Reasonably small [dime size] wounds each was some depth. Surface of these wounds does not look particularly healthy as there is adherent slough. There is no evidence of surrounding infection or inflammation. ABIs in this clinic were 0.87 on the right and 0.81 on the left. She is listed as having PAD and is a smoker. Not a diabetic 02/20/16 patient I gave antibiotics to last week for erythema around both wound sites on the left lateral leg and right lateral leg. This appears to be a lot better. One of the areas on the left leg is healed however she still has 3 punched-out open areas on the left lateral calf and one on the right lateral calf and one on the dorsal foot. She is an ex-smoker quitting 1 month ago. ABIs in this clinic were 0.87 on the right 0.81 on the left 03/12/16; this is a patient I really don't have a good sense of. It would appear for the first 5 or 6 weeks of her stay in this clinic she was cared for by Dr. Con Memos. She appeared on my schedule in mid October and I saw her twice. She has not been here however in 3 weeks. She has advanced Wound Care at home where she lives with her husband. She has multiple sclerosis.  I saw her the first time she had 4 punched-out small wounds on  the anterior lateral left leg it appears that she is now down to 2. She also had wounds on the lateral and medial aspect of her right leg which were also small and punched out. The only one that remains is on the right lateral. Because of the nature of her wound I went ahead and ordered formal arterial studies this showed an ABI of 1 on the right and one on the left. TBI of 1.4 on the right and 0.79 on the left. She had normal waveforms. She was in the emergency room on 02/28/16; there is a noted edema of her left leg after a fall. She had a duplex ultrasound that showed no evidence for an acute DVT from the left groin to the popliteal fossa. The study was limited in the calf veins due to edema. Also noticeable that she had a left inguinal enlarged lymph node up to 6.2 cm. She also had a left knee x-ray that showed no acute findings and a right foot x-ray that showed soft tissue swelling but no radiographic evidence of osteomyelitis. The patient does stop smoking 03-19-16 Ms Woehrle presents today for evaluation of her bilateral lower extremity ulcerations. She states that she has not smoked in several weeks. She denies any pain or discomfort to bilateral lower extremities, tolerating compression therapy as ordered. 03/26/16; I have not seen this patient in 2 weeks however she has done very well with improvements in the areas in question. After we obtain normal arterial studies, increasing the 4-layer compression really seems to look done a nice job here. She has one open area on the left lateral leg and one on the medial right leg. These have filled in nicely and are now superficial wounds that showed epithelialized. I note the left inguinal lymph node at 6.2 cm. I may need to refer this back to the patient's primary doctor. 04/03/16; patient has 2 remaining wounds 1 on the left lateral leg and one on the right medial leg. Both of filled in nicely since we are able to increase her from a 3 layer to a  4 layer compression. I am also following up on the lymph node notable on her left leg DVT rule out in November 04/10/16; area on the right lateral leg is healed. The area on the left leg is still open but looks considerably better with healthy granulation and less wound area. I12/20/17; last week we healed the patient doubt with regards to the wound on the right leg to her on compression stocking 20-30 mmHg as it turns out I don't think she had a stocking, predictably therefore she has reopened. The left leg as well as the wound on the lateral aspect. Been generally small Electronic Signature(s) Signed: 04/17/2016 5:40:41 PM By: Linton Ham MD [name] ([id number]) Entered By: Linton Ham on 04/17/2016 16:14:43 [name] ([id number]) -------------------------------------------------------------------------------- Physical Exam Details Patient Name: [name] Date of Service: 04/17/2016 1:15 PM Medical Record Patient Account Number: [id number] Number: Treating RN: Cornell Barman 03-Sep-1950 (65 y.o. Other Clinician: Date of Birth/Sex: Female) Treating ROBSON, MICHAEL Primary Care Physician/Extender: Peri Jefferson, [place] Physician: Referring Physician: LADA, MELINDA Weeks in Treatment: 21 Constitutional Sitting or standing Blood Pressure is within target range for patient.. Pulse regular and within target range for patient.[place] Kitchen Respirations regular, non-labored and within target range.. Temperature is normal and within the target range for the patient.. Patient's appearance is neat  and clean. Appears in no acute distress. Well nourished and well developed.. Cardiovascular Pedal pulses palpable and strong bilaterally.. Edema present in both extremities. However the right leg now has much more edema than the left [] . Notes Wound exam; the area on the right leg is reopened. There is very significant 2-3+ edema in this leg. This will clearly need to be readdressed  rewrap. The area on the left leg was debrided of surface eschar nonviable subcutaneous tissue once again the base of this cleans up quite nicely. No evidence of surrounding infection either area. Electronic Signature(s) Signed: 04/17/2016 5:40:41 PM By: Linton Ham MD Entered By: Linton Ham on 04/17/2016 16:16:34 [name] -------------------------------------------------------------------------------- Physician Orders Details Patient Name: [name] Date of Service: 04/17/2016 1:15 PM Medical Record Patient Account Number: [id number] Number: Treating RN: Cornell Barman [birth year] (65 y.o. Other Clinician: Date of Birth/Sex: Female) Treating ROBSON, MICHAEL Primary Care Physician/Extender: G LADA, [place] Physician: Referring Physician: LADA, MELINDA Weeks in Treatment: 60 Verbal / Phone Orders: Yes Clinician: Cornell Barman Read Back and Verified: Yes Diagnosis Coding ICD-10 Coding Code Description [phone] Non-pressure chronic ulcer of right calf with fat layer exposed L97.512 Non-pressure chronic ulcer of other part of right foot with fat layer exposed L97.222 Non-pressure chronic ulcer of left calf with fat layer exposed G35 Multiple sclerosis E66.01 Morbid (severe) obesity due to excess calories F17.218 Nicotine dependence, cigarettes, with other nicotine-induced disorders L04.3 Acute lymphadenitis of lower limb Wound Cleansing Wound #5 Left,Distal,Lateral Lower Leg o Cleanse wound with mild soap and water - HHRN to wash legs with soap and water o No tub bath. Wound #8 Right,Lateral Lower Leg o Cleanse wound with mild soap and water - HHRN to wash legs with soap and water o No tub bath. Anesthetic Wound #5 Left,Distal,Lateral Lower Leg o Topical Lidocaine 4% cream applied to wound bed prior to debridement - in clinic only. Wound #8 Right,Lateral Lower Leg o Topical Lidocaine 4% cream applied to wound bed prior to  debridement - in clinic only. Primary Wound Dressing Wound #5 Left,Distal,Lateral Lower Leg o Prisma Ag - or collagen with silver equivalent - silver alginate is not collagen Wound #8 Right,Lateral Lower Leg o Prisma Ag - or collagen with silver equivalent - silver alginate is not collagen [name], [name] H. ([id number]) Secondary Dressing Wound #5 Left,Distal,Lateral Lower Leg o ABD pad Wound #8 Right,Lateral Lower Leg o ABD pad Dressing Change Frequency Wound #5 Left,Distal,Lateral Lower Leg o Three times weekly - [facility] to change Monday and Friday Pt comes to clinic Wednesday Wound #8 Right,Lateral Lower Leg o Three times weekly - [facility] to change Monday and Friday Pt comes to clinic Wednesday Follow-up Appointments Wound #5 Left,Distal,Lateral Lower Leg o Return Appointment in 1 week. Wound #8 Right,Lateral Lower Leg o Return Appointment in 1 week. Edema Control Wound #5 Left,Distal,Lateral Lower Leg o 4 Layer Compression System - Bilateral Wound #8 Right,Lateral Lower Leg o 4 Layer Compression System - Bilateral Additional Orders / Instructions Wound #5 Left,Distal,Lateral Lower Leg o Stop Smoking o Increase protein intake. Home Health Wound #5 Left,Distal,Lateral Lower Leg o Continue Home Health Visits - Advanced---[facility] to change wraps Monday and Friday *********[facility] please order compression hose for patient 30-40 mmHg********* o Home Health Nurse may visit PRN to address patientos wound care needs. o FACE TO FACE ENCOUNTER: MEDICARE and MEDICAID PATIENTS: I certify that this patient is under my care and that I had a face-to-face encounter that meets the physician face-to-face encounter requirements  with this patient on this date. The encounter with the patient was in whole or in part for the following MEDICAL CONDITION: (primary reason for [place]) MEDICAL NECESSITY: I certify, that based on my findings, NURSING services are a  medically necessary home health service. HOME BOUND STATUS: I certify that my clinical findings [name], [name]. ([id number]) support that this patient is homebound (i.e., Due to illness or injury, pt requires aid of supportive devices such as crutches, cane, wheelchairs, walkers, the use of special transportation or the assistance of another person to leave their place of residence. There is a normal inability to leave the home and doing so requires considerable and taxing effort. Other absences are for medical reasons / religious services and are infrequent or of short duration when for other reasons). o If current dressing causes regression in wound condition, may D/C ordered dressing product/s and apply Normal Saline Moist Dressing daily until next [place] / Other MD appointment. [place] of regression in wound condition at [phone]. o Please direct any NON-WOUND related issues/requests for orders to patient's Primary Care Physician Electronic Signature(s) Signed: 04/17/2016 5:40:41 PM By: Linton Ham MD Signed: 04/18/2016 6:03:08 PM By: Gretta Cool RN, BSN, Kim RN, BSN Entered By: Gretta Cool, RN, BSN, Kim on 04/17/2016 15:18:14 [name] -------------------------------------------------------------------------------- Problem List Details Patient Name: [name] Date of Service: 04/17/2016 1:15 PM Medical Record Patient Account Number: [id number] Number: Treating RN: Cornell Barman 1951/03/03 (65 y.o. Other Clinician: Date of Birth/Sex: Female) Treating ROBSON, MICHAEL Primary Care Physician/Extender: Peri Jefferson, [place] Physician: Referring Physician: LADA, MELINDA Weeks in Treatment: 21 Active Problems ICD-10 Encounter Code Description Active Date Diagnosis L97.212 Non-pressure chronic ulcer of right calf with fat layer 11/20/2015 Yes exposed L97.512 Non-pressure chronic ulcer of other part of right foot with  11/20/2015 Yes fat layer exposed L97.222 Non-pressure chronic ulcer of left calf with fat layer 11/20/2015 Yes exposed G35 Multiple sclerosis 11/20/2015 Yes E66.01 Morbid (severe) obesity due to excess calories 11/20/2015 Yes F17.218 Nicotine dependence, cigarettes, with other nicotine- 11/20/2015 Yes induced disorders L04.3 Acute lymphadenitis of lower limb 04/03/2016 Yes Inactive Problems Resolved Problems Electronic Signature(s) KAJAL, BONICA ([id number]) Signed: 04/17/2016 5:40:41 PM By: Linton Ham MD Entered By: Linton Ham on 04/17/2016 16:09:33 [place], [name] -------------------------------------------------------------------------------- Progress Note Details Patient Name: [name] Date of Service: 04/17/2016 1:15 PM Medical Record Patient Account Number: [id number] Number: Treating RN: Cornell Barman 05-Oct-1950 (65 y.o. Other Clinician: Date of Birth/Sex: Female) Treating ROBSON, MICHAEL Primary Care Physician/Extender: Peri Jefferson, [place] Physician: Referring Physician: LADA, MELINDA Weeks in Treatment: 21 Subjective Chief Complaint Information obtained from Patient returns for evaluation of bilateral lower extremity ulcerations History of Present Illness (HPI) The following HPI elements were documented for the patient's wound: Location: left and right lower extremity and dorsum of the right foot Quality: Patient reports experiencing a sharp pain to affected area(s). Severity: Patient rates the pain to be a 8 out of 10 especially with palpation Duration: Patient has had the wound for > 6 months prior to seeking treatment at the wound center Timing: Pain in wound is Intermittent in severity but persistent Context: The wound appeared gradually over time Modifying Factors: Other treatment(s) tried include: symptomatic treatment as advised by her PCP Associated Signs and Symptoms: Patient reports having increase swelling iin her bilateral  lower extremities 65 year old with a past medical history significant for MS, urinary incontinence, and obesity. She has been seen in the wound clinic  before for lower extremity ulcerations treated with compression therapy. she is also known to have hypertension, peripheral vascular disease, COPD, obstructive sleep apnea, lumbar radiculopathy, kyphoscoliosis, urinary issues and tobacco abuse. Smokes a packet of cigarettes a day was recently seen at the [facility] for swelling of her legs and feet with a ulceration on the dorsum of the right foot which has been there for about 6 months. she was recently in the ER about a month ago where she was seen for shortness of breath and swelling of the legs and a chest x-ray was within normal limits had an increase in her BNP and was given Lasix and put on doxycycline for a mild cellulitis and possible UTI. Wounds aresmaller today 11/13/15. Debrided and will continue Santyl. 12/21/2015 -- she was admitted to the hospital overnight on 12/18/2015 and diagnosed with urinary retention and cellulitis of the left lower leg. is past to take clindamycin and use Santyl for her wounds. 01/15/2016 -- come back to see [place] for almost a month and continues to be noncompliant with her dressings 01/30/16 patient presents today for a follow-up visit concerning her ongoing bilateral lower extremity wounds. We have not seen her for the past 2 weeks although we are supposed to be seen her for weekly visits. She currently has a Foley catheter. She tells me that the wounds are intensely painful especially with pressure and palpation at this point in time. Fortunately she is having no interval signs or symptoms of systemic infection but unfortunately the wounds have not improved dramatically since we last saw her. She does [name], [name] H. ([id number]) have home health coming to take care of her as well. She is currently not in any compression wraps. 02/06/16 ON  evaluation today patient continues to experience discomfort regarding her bilateral lower extremity ulcers. She has continued to tolerate the dressing changes at this point in time and continues to have a Foley catheter as well. Fortunately she is back this week in the past she has been somewhat noncompliant with follow-ups I'm glad to see her today. She tells me that her pain level varies but can be as high as a 7 out of 10 with manipulation of the wound. She tells me that she used to be on oxycodone which was managed by the pain clinic although she is no longer on that as she tells me that she was actually smoking marijuana at the time and when this was found out they discontinued her pain medication. She no longer is taking anything pain medication wise and she also does not smoke cigarettes nor marijuana at this point. 02/12/16; this is a patient I don't believe I have previously seen. She has multiple sclerosis. She has 4 punched-out areas on the anterior lateral left leg 2 areas on the right these are all in the same condition. Reasonably small [dime size] wounds each was some depth. Surface of these wounds does not look particularly healthy as there is adherent slough. There is no evidence of surrounding infection or inflammation. ABIs in this clinic were 0.87 on the right and 0.81 on the left. She is listed as having PAD and is a smoker. Not a diabetic 02/20/16 patient I gave antibiotics to last week for erythema around both wound sites on the left lateral leg and right lateral leg. This appears to be a lot better. One of the areas on the left leg is healed however she still has 3 punched-out open areas on the left lateral calf  and one on the right lateral calf and one on the dorsal foot. She is an ex-smoker quitting 1 month ago. ABIs in this clinic were 0.87 on the right 0.81 on the left 03/12/16; this is a patient I really don't have a good sense of. It would appear for the first 5 or 6  weeks of her stay in this clinic she was cared for by Dr. Con Memos. She appeared on my schedule in mid October and I saw her twice. She has not been here however in 3 weeks. She has advanced Wound Care at home where she lives with her husband. She has multiple sclerosis. I saw her the first time she had 4 punched-out small wounds on the anterior lateral left leg it appears that she is now down to 2. She also had wounds on the lateral and medial aspect of her right leg which were also small and punched out. The only one that remains is on the right lateral. Because of the nature of her wound I went ahead and ordered formal arterial studies this showed an ABI of 1 on the right and one on the left. TBI of 1.4 on the right and 0.79 on the left. She had normal waveforms. She was in the emergency room on 02/28/16; there is a noted edema of her left leg after a fall. She had a duplex ultrasound that showed no evidence for an acute DVT from the left groin to the popliteal fossa. The study was limited in the calf veins due to edema. Also noticeable that she had a left inguinal enlarged lymph node up to 6.2 cm. She also had a left knee x-ray that showed no acute findings and a right foot x-ray that showed soft tissue swelling but no radiographic evidence of osteomyelitis. The patient does stop smoking 03-19-16 Ms Lukowski presents today for evaluation of her bilateral lower extremity ulcerations. She states that she has not smoked in several weeks. She denies any pain or discomfort to bilateral lower extremities, tolerating compression therapy as ordered. 03/26/16; I have not seen this patient in 2 weeks however she has done very well with improvements in the areas in question. After we obtain normal arterial studies, increasing the 4-layer compression really seems to look done a nice job here. She has one open area on the left lateral leg and one on the medial right leg. These have filled in nicely and are now  superficial wounds that showed epithelialized. I note the left inguinal lymph node at 6.2 cm. I may need to refer this back to the patient's primary doctor. 04/03/16; patient has 2 remaining wounds 1 on the left lateral leg and one on the right medial leg. Both of filled in nicely since we are able to increase her from a 3 layer to a 4 layer compression. I am also following up on the lymph node notable on her left leg DVT rule out in November 04/10/16; area on the right lateral leg is healed. The area on the left leg is still open but looks considerably better with healthy granulation and less wound area. I12/20/17; last week we healed the patient doubt with regards to the wound on the right leg to her on [name], [name] H. ([id number]) compression stocking 20-30 mmHg as it turns out I don't think she had a stocking, predictably therefore she has reopened. The left leg as well as the wound on the lateral aspect. Been generally small Objective Constitutional Sitting or standing Blood Pressure is  within target range for patient.. Pulse regular and within target range for patient.Marland Kitchen Respirations regular, non-labored and within target range.. Temperature is normal and within the target range for the patient.. Patient's appearance is neat and clean. Appears in no acute distress. Well nourished and well developed.. Vitals Time Taken: 2:50 PM, Height: 63 in, Weight: 270 lbs, BMI: 47.8, Temperature: 98.0 F, Pulse: 69 bpm, Respiratory Rate: 20 breaths/min, Blood Pressure: 142/82 mmHg. Cardiovascular Pedal pulses palpable and strong bilaterally.. Edema present in both extremities. However the right leg now has much more edema than the left [] . General Notes: Wound exam; the area on the right leg is reopened. There is very significant 2-3+ edema in this leg. This will clearly need to be readdressed rewrap. The area on the left leg was debrided of surface eschar nonviable subcutaneous tissue once again the  base of this cleans up quite nicely. No evidence of surrounding infection either area. Integumentary (Hair, Skin) Wound #5 status is Open. Original cause of wound was Gradually Appeared. The wound is located on the Left,Distal,Lateral Lower Leg. The wound measures 0.3cm length x 0.4cm width x 0.1cm depth; 0.094cm^2 area and 0.009cm^3 volume. The wound is limited to skin breakdown. There is no tunneling or undermining noted. There is a medium amount of serous drainage noted. The wound margin is flat and intact. There is large (67-100%) pink granulation within the wound bed. There is a small (1-33%) amount of necrotic tissue within the wound bed including Adherent Slough. The periwound skin appearance exhibited: Induration, Dry/Scaly. The periwound skin appearance did not exhibit: Callus, Crepitus, Excoriation, Fluctuance, Friable, Localized Edema, Rash, Scarring, Maceration, Moist, Atrophie Blanche, Cyanosis, Ecchymosis, Hemosiderin Staining, Mottled, Pallor, Rubor, Erythema. Periwound temperature was noted as No Abnormality. The periwound has tenderness on palpation. Wound #8 status is Open. Original cause of wound was Gradually Appeared. The wound is located on the Right,Lateral Lower Leg. The wound measures 0.7cm length x 0.9cm width x 0.1cm depth; 0.495cm^2 area and 0.049cm^3 volume. There is fat exposed. There is no tunneling or undermining noted. There is a medium amount of serous drainage noted. The wound margin is flat and intact. There is no granulation within the wound bed. There is a large (67-100%) amount of necrotic tissue within the wound bed including Eschar. The periwound skin appearance exhibited: Dry/Scaly, Ecchymosis. The periwound skin appearance did not exhibit: Callus, Crepitus, Excoriation, Fluctuance, Friable, Induration, Localized Edema, Rash, Scarring, Maceration, Moist, Atrophie Blanche, Cyanosis, Hemosiderin Staining, Mottled, Pallor, Rubor, Reffner, Yaritzel H.  ([id number]) Erythema. 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 There was a Skin/Subcutaneous Tissue Debridement [id number]) debridement with total area of 0.12 sq cm performed by Ricard Dillon, MD. with the following instrument(s): Curette to remove Viable and Non-Viable tissue/material including Eschar and Subcutaneous after achieving pain control using Other (lidocaine 4%). A time out was conducted at 15:12, prior to the start of the procedure. A Minimum amount of bleeding was controlled with Pressure. The procedure was tolerated well with a pain level of 0 throughout and  a pain level of 0 following the procedure. Post Debridement Measurements: 0.9cm length x 0.7cm width x 0.1cm depth; 0.049cm^3 volume. Character of Wound/Ulcer Post Debridement requires further debridement. Severity of Tissue Post Debridement is: Fat layer exposed. Post procedure Diagnosis Wound #5: Same as Pre-Procedure Plan Wound Cleansing: Wound #5 Left,Distal,Lateral Lower Leg: Cleanse wound with mild soap and water - HHRN to wash legs with soap and water No tub bath. [name], [name] ([id number]) Wound #8 Right,Lateral Lower Leg: Cleanse wound with mild soap and water - HHRN to wash legs with soap and water No tub bath. Anesthetic: Wound #5 Left,Distal,Lateral Lower Leg: Topical Lidocaine 4% cream applied to wound bed prior to debridement - in clinic only. Wound #8 Right,Lateral  Lower Leg: Topical Lidocaine 4% cream applied to wound bed prior to debridement - in clinic only. Primary Wound Dressing: Wound #5 Left,Distal,Lateral Lower Leg: Prisma Ag - or collagen with silver equivalent - silver alginate is not collagen Wound #8 Right,Lateral Lower Leg: Prisma Ag - or collagen with silver equivalent - silver alginate is not collagen Secondary Dressing: Wound #5 Left,Distal,Lateral Lower Leg: ABD pad Wound #8 Right,Lateral Lower Leg: ABD pad Dressing Change Frequency: Wound #5 Left,Distal,Lateral Lower Leg: Three times weekly - [facility] to change Monday and Friday Pt comes to clinic Wednesday Wound #8 Right,Lateral Lower Leg: Three times weekly - [facility] to change Monday and Friday Pt comes to clinic Wednesday Follow-up Appointments: Wound #5 Left,Distal,Lateral Lower Leg: Return Appointment in 1 week. Wound #8 Right,Lateral Lower Leg: Return Appointment in 1 week. Edema Control: Wound #5 Left,Distal,Lateral Lower Leg: 4 Layer Compression System - Bilateral Wound #8 Right,Lateral Lower Leg: 4 Layer Compression System - Bilateral Additional Orders / Instructions: Wound #5 Left,Distal,Lateral Lower Leg: Stop Smoking Increase protein intake. Home Health: Wound #5 Left,Distal,Lateral Lower Leg: Continue Home Health Visits - [facility]---[facility] to change wraps Monday and Friday *********[facility] please order compression hose for patient 30-40 mmHg********* Home Health Nurse may visit PRN to address patient s wound care needs. FACE TO FACE ENCOUNTER: MEDICARE and MEDICAID PATIENTS: I certify that this patient is under my care and that I had a face-to-face encounter that meets the physician face-to-face encounter requirements with this patient on this date. The encounter with the patient was in whole or in part for the following MEDICAL CONDITION: (primary reason for [place]) MEDICAL NECESSITY: I certify, that based on my findings, NURSING services are a medically  necessary home health service. HOME BOUND STATUS: I certify that my clinical findings support that this patient is homebound (i.e., Due to illness or injury, pt requires aid of supportive devices such as crutches, cane, wheelchairs, walkers, the use of special transportation or the assistance of another person to leave their place of residence. There is a normal inability to leave the home and doing so requires considerable and taxing effort. Other absences are [name], [name]. ([id number]) for medical reasons / religious services and are infrequent or of short duration when for other reasons). If current dressing causes regression in wound condition, may D/C ordered dressing product/s and apply Normal Saline Moist Dressing daily until next [place] / Other MD appointment. [place] of regression in wound condition at [phone]. Please direct any NON-WOUND related issues/requests for orders to patient's Primary Care Physician prisma bilaterally,ABDs,bilateral 4 layer compression Electronic Signature(s) Signed: 04/17/2016 5:40:41 PM By: Linton Ham MD Entered By: Linton Ham on 04/17/2016 16:17:50 [name], [name] -------------------------------------------------------------------------------- SuperBill Details Patient Name: [name] Date  of Service: 04/17/2016 Medical Record Patient Account Number: [id number] Number: Treating RN: Cornell Barman 09-12-1950 (65 y.o. Other Clinician: Date of Birth/Sex: Female) Treating ROBSON, MICHAEL Primary Care Physician/Extender: Peri Jefferson, [place] Physician: Weeks in Treatment: 21 Referring Physician: LADA, [place] Diagnosis Coding ICD-10 Codes Code Description [phone] Non-pressure chronic ulcer of right calf with fat layer exposed L97.512 Non-pressure chronic ulcer of other part of right foot with fat layer exposed L97.222 Non-pressure chronic ulcer of left calf with fat layer  exposed G35 Multiple sclerosis E66.01 Morbid (severe) obesity due to excess calories F17.218 Nicotine dependence, cigarettes, with other nicotine-induced disorders L04.3 Acute lymphadenitis of lower limb Facility Procedures CPT4 Code: [id number] Description: [id number] - DEB SUBQ TISSUE 20 SQ CM/< ICD-10 Description Diagnosis L97.222 Non-pressure chronic ulcer of left calf with fat l Modifier: ayer exposed Quantity: 1 Physician Procedures CPT4 Code: [id number] Description: 11042 - WC PHYS SUBQ TISS 20 SQ CM ICD-10 Description Diagnosis L97.222 Non-pressure chronic ulcer of left calf with fat l Modifier: ayer exposed Quantity: 1 Electronic Signature(s) Signed: 04/17/2016 5:40:41 PM By: Linton Ham MD Entered By: Linton Ham on 04/17/2016 16:18:31

## 2016-04-23 DIAGNOSIS — L03116 Cellulitis of left lower limb: Secondary | ICD-10-CM | POA: Diagnosis not present

## 2016-04-24 ENCOUNTER — Encounter: Payer: Medicare Other | Admitting: Internal Medicine

## 2016-04-24 DIAGNOSIS — L97213 Non-pressure chronic ulcer of right calf with necrosis of muscle: Secondary | ICD-10-CM | POA: Diagnosis not present

## 2016-04-24 DIAGNOSIS — L97512 Non-pressure chronic ulcer of other part of right foot with fat layer exposed: Secondary | ICD-10-CM | POA: Diagnosis not present

## 2016-04-24 DIAGNOSIS — G35 Multiple sclerosis: Secondary | ICD-10-CM | POA: Diagnosis not present

## 2016-04-24 DIAGNOSIS — I739 Peripheral vascular disease, unspecified: Secondary | ICD-10-CM | POA: Diagnosis not present

## 2016-04-24 DIAGNOSIS — J449 Chronic obstructive pulmonary disease, unspecified: Secondary | ICD-10-CM | POA: Diagnosis not present

## 2016-04-24 DIAGNOSIS — L97221 Non-pressure chronic ulcer of left calf limited to breakdown of skin: Secondary | ICD-10-CM | POA: Diagnosis not present

## 2016-04-24 DIAGNOSIS — Z79899 Other long term (current) drug therapy: Secondary | ICD-10-CM | POA: Diagnosis not present

## 2016-04-24 DIAGNOSIS — L043 Acute lymphadenitis of lower limb: Secondary | ICD-10-CM | POA: Diagnosis not present

## 2016-04-24 DIAGNOSIS — I10 Essential (primary) hypertension: Secondary | ICD-10-CM | POA: Diagnosis not present

## 2016-04-24 DIAGNOSIS — L97212 Non-pressure chronic ulcer of right calf with fat layer exposed: Secondary | ICD-10-CM | POA: Diagnosis not present

## 2016-04-24 DIAGNOSIS — G4733 Obstructive sleep apnea (adult) (pediatric): Secondary | ICD-10-CM | POA: Diagnosis not present

## 2016-04-24 DIAGNOSIS — L97222 Non-pressure chronic ulcer of left calf with fat layer exposed: Secondary | ICD-10-CM | POA: Diagnosis not present

## 2016-04-25 DIAGNOSIS — L97219 Non-pressure chronic ulcer of right calf with unspecified severity: Secondary | ICD-10-CM | POA: Diagnosis not present

## 2016-04-25 DIAGNOSIS — I70232 Atherosclerosis of native arteries of right leg with ulceration of calf: Secondary | ICD-10-CM | POA: Diagnosis not present

## 2016-04-25 DIAGNOSIS — G35 Multiple sclerosis: Secondary | ICD-10-CM | POA: Diagnosis not present

## 2016-04-25 DIAGNOSIS — L97419 Non-pressure chronic ulcer of right heel and midfoot with unspecified severity: Secondary | ICD-10-CM | POA: Diagnosis not present

## 2016-04-25 DIAGNOSIS — Z792 Long term (current) use of antibiotics: Secondary | ICD-10-CM | POA: Diagnosis not present

## 2016-04-25 DIAGNOSIS — L03116 Cellulitis of left lower limb: Secondary | ICD-10-CM | POA: Diagnosis not present

## 2016-04-25 DIAGNOSIS — J449 Chronic obstructive pulmonary disease, unspecified: Secondary | ICD-10-CM | POA: Diagnosis not present

## 2016-04-25 DIAGNOSIS — I10 Essential (primary) hypertension: Secondary | ICD-10-CM | POA: Diagnosis not present

## 2016-04-25 DIAGNOSIS — L97929 Non-pressure chronic ulcer of unspecified part of left lower leg with unspecified severity: Secondary | ICD-10-CM | POA: Diagnosis not present

## 2016-04-25 DIAGNOSIS — I70249 Atherosclerosis of native arteries of left leg with ulceration of unspecified site: Secondary | ICD-10-CM | POA: Diagnosis not present

## 2016-04-25 DIAGNOSIS — R339 Retention of urine, unspecified: Secondary | ICD-10-CM | POA: Diagnosis not present

## 2016-04-25 DIAGNOSIS — I70234 Atherosclerosis of native arteries of right leg with ulceration of heel and midfoot: Secondary | ICD-10-CM | POA: Diagnosis not present

## 2016-04-25 NOTE — Progress Notes (Signed)
XIYA, MAEZ ([id number]) Visit Report for 04/24/2016 Arrival Information Details Patient Name: NANNETTE, HUN Date of Service: 04/24/2016 1:15 PM Medical Record Patient Account Number: [id number] Number: Treating RN: Montey Hora 04/18/1951 (65 y.o. Other Clinician: Date of Birth/Sex: Female) Treating ROBSON, [place] Primary Care Physician: LADA, MELINDA Physician/Extender: G Referring Physician: LADA, MELINDA Weeks in Treatment: 22 Visit Information History Since Last Visit Added or deleted any medications: No Patient Arrived: Wheel Chair Any new allergies or adverse reactions: No Arrival Time: 14:16 Had a fall or experienced change in No activities of daily living that may affect Accompanied By: spiuse risk of falls: Transfer Assistance: None Signs or symptoms of abuse/neglect since last No Patient Identification Verified: Yes visito Secondary Verification Process Yes Hospitalized since last visit: No Completed: Pain Present Now: Yes Patient Requires Transmission-Based No Precautions: Patient Has Alerts: Yes Notes patient arrived at 1403 Electronic Signature(s) Signed: 04/24/2016 5:43:35 PM By: Montey Hora Entered By: Montey Hora on 04/24/2016 14:16:59 [name] ([id number]) -------------------------------------------------------------------------------- Encounter Discharge Information Details Patient Name: [name] Date of Service: 04/24/2016 1:15 PM Medical Record Patient Account Number: [id number] Number: Treating RN: Montey Hora [birth year] (65 y.o. Other Clinician: Date of Birth/Sex: Female) Treating ROBSON, [place] Primary Care Physician: LADA, MELINDA Physician/Extender: G Referring Physician: LADA, MELINDA Weeks in Treatment: 22 Encounter Discharge Information Items Discharge Pain Level: 0 Discharge Condition: Stable Ambulatory Status: Wheelchair Discharge Destination: Home Transportation: Private  Auto Accompanied By: self Schedule Follow-up Appointment: Yes Medication Reconciliation completed and provided to Patient/Care No Carmilla Granville: Provided on Clinical Summary of Care: 04/24/2016 Form Type Recipient Paper Patient BW Electronic Signature(s) Signed: 04/24/2016 4:54:31 PM By: Montey Hora Previous Signature: 04/24/2016 2:59:20 PM Version By: Ruthine Dose Entered By: Montey Hora on 04/24/2016 16:54:31 Deavers, [name] ([id number]) -------------------------------------------------------------------------------- Lower Extremity Assessment Details Patient Name: [name] Date of Service: 04/24/2016 1:15 PM Medical Record Patient Account Number: [id number] Number: Treating RN: Montey Hora [birth year] (65 y.o. Other Clinician: Date of Birth/Sex: Female) Treating ROBSON, [place] Primary Care Physician: LADA, MELINDA Physician/Extender: G Referring Physician: LADA, MELINDA Weeks in Treatment: 22 Edema Assessment Assessed: [Left: No] [Right: No] E[Left: dema] [Right: :] Calf Left: Right: Point of Measurement: 33 cm From Medial Instep 35.2 cm cm Ankle Left: Right: Point of Measurement: 11 cm From Medial Instep 20 cm cm Vascular Assessment Pulses: Dorsalis Pedis Palpable: [Left:Yes] Posterior Tibial Extremity colors, hair growth, and conditions: Extremity Color: [Left:Normal] Hair Growth on Extremity: [Left:No] Temperature of Extremity: [Left:Warm] Capillary Refill: [Left:< 3 seconds] Electronic Signature(s) Signed: 04/24/2016 5:43:35 PM By: Montey Hora Entered By: Montey Hora on 04/24/2016 14:29:56 Abrell, [name] ([id number]) -------------------------------------------------------------------------------- Multi Wound Chart Details Patient Name: [name] Date of Service: 04/24/2016 1:15 PM Medical Record Patient Account Number: [id number] Number: Treating RN: Montey Hora [birth year] (65 y.o. Other Clinician: Date of  Birth/Sex: Female) Treating ROBSON, MICHAEL Primary Care Physician: LADA, MELINDA Physician/Extender: G Referring Physician: LADA, MELINDA Weeks in Treatment: 22 Vital Signs Height(in): 63 Pulse(bpm): 67 Weight(lbs): 270 Blood Pressure 135/61 (mmHg): Body Mass Index(BMI): 48 Temperature(F): 98.3 Respiratory Rate 20 (breaths/min): Photos: [N/A:N/A] Wound Location: Left Lower Leg - Lateral, Right Lower Leg - Lateral N/A Distal Wounding Event: Gradually Appeared Gradually Appeared N/A Primary Etiology: Venous Leg Ulcer Venous Leg Ulcer N/A Comorbid History: Cataracts, Chronic Cataracts, Chronic N/A Obstructive Pulmonary Obstructive Pulmonary Disease (COPD), Sleep Disease (COPD), Sleep Apnea, Hypertension, Apnea, Hypertension, Osteoarthritis, Neuropathy Osteoarthritis, Neuropathy Date Acquired: 12/21/2015 04/12/2016 N/A Weeks of Treatment: 17 1 N/A Wound Status:  Open Open N/A Measurements L x W x D 0.1x0.1x0.1 0.5x0.6x0.1 N/A (cm) Area (cm) : 0.008 0.236 N/A Volume (cm) : 0.001 0.024 N/A % Reduction in Area: 98.50% 52.30% N/A % Reduction in Volume: 98.20% 51.00% N/A Classification: Partial Thickness Full Thickness Without N/A Exposed Support Structures Mangas, Clorinda H. ([id number]) Exudate Amount: Medium Medium N/A Exudate Type: Serous Serous N/A Exudate Color: amber amber N/A Wound Margin: Flat and Intact Flat and Intact N/A Granulation Amount: Large (67-100%) Large (67-100%) N/A Granulation Quality: Pink N/A N/A Necrotic Amount: None Present (0%) None Present (0%) N/A Exposed Structures: Fascia: No Fat: Yes N/A Fat: No Fascia: No Tendon: No Tendon: No Muscle: No Muscle: No Joint: No Joint: No Bone: No Bone: No Limited to Skin Breakdown Epithelialization: Small (1-33%) None N/A Periwound Skin Texture: Induration: Yes Edema: No N/A Edema: No Excoriation: No Excoriation: No Induration: No Callus: No Callus: No Crepitus: No Crepitus: No Fluctuance:  No Fluctuance: No Friable: No Friable: No Rash: No Rash: No Scarring: No Scarring: No Periwound Skin Dry/Scaly: Yes Dry/Scaly: Yes N/A Moisture: Maceration: No Maceration: No Moist: No Moist: No Periwound Skin Color: Atrophie Blanche: No Ecchymosis: Yes N/A Cyanosis: No Atrophie Blanche: No Ecchymosis: No Cyanosis: No Erythema: No Erythema: No Hemosiderin Staining: No Hemosiderin Staining: No Mottled: No Mottled: No Pallor: No Pallor: No Rubor: No Rubor: No Temperature: No Abnormality N/A N/A Tenderness on Yes No N/A Palpation: Wound Preparation: Ulcer Cleansing: Other: Ulcer Cleansing: N/A soap and water Rinsed/Irrigated with Saline Topical Anesthetic Applied: Other: lidocaine Topical Anesthetic 4% Applied: Other: lidociane 4% Treatment Notes NAYSHA, TUFFY ([id number]) Electronic Signature(s) Signed: 04/24/2016 5:22:07 PM By: Linton Ham MD Entered By: Linton Ham on 04/24/2016 15:31:28 [name] ([id number]) -------------------------------------------------------------------------------- [place] Details Patient Name: [name] Date of Service: 04/24/2016 1:15 PM Medical Record Patient Account Number: [id number] Number: Treating RN: Montey Hora 12-23-1950 (65 y.o. Other Clinician: Date of Birth/Sex: Female) Treating ROBSON, [place] Primary Care Physician: LADA, MELINDA Physician/Extender: G Referring Physician: LADA, MELINDA Weeks in Treatment: 22 Active Inactive Abuse / Safety / Falls / Self Care Management Nursing Diagnoses: Impaired physical mobility Potential for falls Goals: Patient will remain injury free Date Initiated: 11/20/2015 Goal Status: Active Interventions: Assess fall risk on admission and as needed Notes: Nutrition Nursing Diagnoses: Imbalanced nutrition Goals: Patient/caregiver agrees to and verbalizes understanding of need to use nutritional supplements and/or vitamins  as prescribed Date Initiated: 11/20/2015 Goal Status: Active Interventions: Provide education on nutrition Treatment Activities: Education provided on Nutrition : 03/19/2016 Notes: Orientation to the [facility] RAINI, SHENBERGER ([id number]) Nursing Diagnoses: Knowledge deficit related to the wound healing center program Goals: Patient/caregiver will verbalize understanding of the [facility] Program Date Initiated: 11/20/2015 Goal Status: Active Interventions: Provide education on orientation to the wound center Notes: Wound/Skin Impairment Nursing Diagnoses: Impaired tissue integrity Goals: Patient will demonstrate a reduced rate of smoking or cessation of smoking Date Initiated: 11/20/2015 Goal Status: Active Ulcer/skin breakdown will heal within 14 weeks Date Initiated: 11/20/2015 Goal Status: Active Interventions: Assess patient/caregiver ability to obtain necessary supplies Provide education on smoking Notes: Electronic Signature(s) Signed: 04/24/2016 5:43:35 PM By: Montey Hora Entered By: Montey Hora on 04/24/2016 14:55:53 Zara, [name] ([id number]) -------------------------------------------------------------------------------- Pain Assessment Details Patient Name: [name] Date of Service: 04/24/2016 1:15 PM Medical Record Patient Account Number: [id number] Number: Treating RN: Montey Hora 23-Aug-1950 (65 y.o. Other Clinician: Date of Birth/Sex: Female) Treating ROBSON, MICHAEL Primary Care Physician: LADA, MELINDA Physician/Extender: Darnell Level  Referring Physician: LADA, MELINDA Weeks in Treatment: 22 Active Problems Location of Pain Severity and Description of Pain Patient Has Paino Yes Site Locations Pain Location: Generalized Pain With Dressing Change: No Duration of the Pain. Constant / Intermittento Constant Pain Management and Medication Current Pain Management: Notes Topical or injectable lidocaine is offered to  patient for acute pain when surgical debridement is performed. If needed, Patient is instructed to use over the counter pain medication for the following 24-48 hours after debridement. Wound care MDs do not prescribed pain medications. Patient has chronic pain or uncontrolled pain. Patient has been instructed to make an appointment with their Primary Care Physician for pain management. Electronic Signature(s) Signed: 04/24/2016 5:43:35 PM By: Montey Hora Entered By: Montey Hora on 04/24/2016 14:17:26 [name] ([id number]) -------------------------------------------------------------------------------- Patient/Caregiver Education Details Patient Name: [name] Date of Service: 04/24/2016 1:15 PM Medical Record Patient Account Number: [id number] Number: Treating RN: Montey Hora 12/24/1950 (65 y.o. Other Clinician: Date of Birth/Gender: Female) Treating ROBSON, [place] Primary Care Physician: LADA, MELINDA Physician/Extender: G Referring Physician: LADA, MELINDA Weeks in Treatment: 22 Education Assessment Education Provided To: Patient Education Topics Provided Venous: Handouts: Other: please get compression garment Methods: Explain/Verbal, Printed Responses: State content correctly Electronic Signature(s) Signed: 04/24/2016 5:43:35 PM By: Montey Hora Entered By: Montey Hora on 04/24/2016 16:54:54 Hayden, [name] ([id number]) -------------------------------------------------------------------------------- Wound Assessment Details Patient Name: [name] Date of Service: 04/24/2016 1:15 PM Medical Record Patient Account Number: [id number] Number: Treating RN: Montey Hora 1951/02/02 (65 y.o. Other Clinician: Date of Birth/Sex: Female) Treating ROBSON, MICHAEL Primary Care Physician: LADA, MELINDA Physician/Extender: G Referring Physician: LADA, MELINDA Weeks in Treatment: 22 Wound Status Wound Number: 5 Primary Venous  Leg Ulcer Etiology: Wound Location: Left Lower Leg - Lateral, Distal Wound Open Wounding Event: Gradually Appeared Status: Date Acquired: 12/21/2015 Comorbid Cataracts, Chronic Obstructive Weeks Of Treatment: 17 History: Pulmonary Disease (COPD), Sleep Clustered Wound: No Apnea, Hypertension, Osteoarthritis, Neuropathy Photos Wound Measurements Length: (cm) 0.1 Width: (cm) 0.1 Depth: (cm) 0.1 Area: (cm) 0.008 Volume: (cm) 0.001 % Reduction in Area: 98.5% % Reduction in Volume: 98.2% Epithelialization: Small (1-33%) Tunneling: No Undermining: No Wound Description Classification: Partial Thickness Wound Margin: Flat and Intact Exudate Amount: Medium Exudate Type: Serous Exudate Color: amber Wound Bed Granulation Amount: Large (67-100%) Exposed Structure Granulation Quality: Pink Fascia Exposed: No Longnecker, Natsuko H. ([id number]) Necrotic Amount: None Present (0%) Fat Layer Exposed: No Tendon Exposed: No Muscle Exposed: No Joint Exposed: No Bone Exposed: No Limited to Skin Breakdown Periwound Skin Texture Texture Color No Abnormalities Noted: No No Abnormalities Noted: No Callus: No Atrophie Blanche: No Crepitus: No Cyanosis: No Excoriation: No Ecchymosis: No Fluctuance: No Erythema: No Friable: No Hemosiderin Staining: No Induration: Yes Mottled: No Localized Edema: No Pallor: No Rash: No Rubor: No Scarring: No Temperature / Pain Moisture Temperature: No Abnormality No Abnormalities Noted: No Tenderness on Palpation: Yes Dry / Scaly: Yes Maceration: No Moist: No Wound Preparation Ulcer Cleansing: Other: soap and water, Topical Anesthetic Applied: Other: lidocaine 4%, Treatment Notes Wound #5 (Left, Distal, Lateral Lower Leg) 1. Cleansed with: Cleanse wound with antibacterial soap and water 2. Anesthetic Topical Lidocaine 4% cream to wound bed prior to debridement 4. Dressing Applied: Prisma Ag 5. Secondary Dressing Applied ABD Pad 7.  Secured with 3 Layer Compression System - Left Lower Extremity Electronic Signature(s) Signed: 04/24/2016 5:43:35 PM By: Montey Hora Entered By: Montey Hora on 04/24/2016 14:28:24 [name] ([id number]) Hilton, [name] ([id number]) -------------------------------------------------------------------------------- Wound Assessment  Details Patient Name: SISTINE, HASLETT Date of Service: 04/24/2016 1:15 PM Medical Record Patient Account Number: [id number] Number: Treating RN: Montey Hora 01/09/1951 (65 y.o. Other Clinician: Date of Birth/Sex: Female) Treating ROBSON, MICHAEL Primary Care Physician: LADA, MELINDA Physician/Extender: G Referring Physician: LADA, MELINDA Weeks in Treatment: 22 Wound Status Wound Number: 8 Primary Venous Leg Ulcer Etiology: Wound Location: Right Lower Leg - Lateral Wound Open Wounding Event: Gradually Appeared Status: Date Acquired: 04/12/2016 Comorbid Cataracts, Chronic Obstructive Weeks Of Treatment: 1 History: Pulmonary Disease (COPD), Sleep Clustered Wound: No Apnea, Hypertension, Osteoarthritis, Neuropathy Photos Wound Measurements Length: (cm) 0.5 Width: (cm) 0.6 Depth: (cm) 0.1 Area: (cm) 0.236 Volume: (cm) 0.024 % Reduction in Area: 52.3% % Reduction in Volume: 51% Epithelialization: None Tunneling: No Undermining: No Wound Description Full Thickness Without Exposed Foul Odor After Classification: Support Structures Wound Margin: Flat and Intact Exudate Medium Amount: Exudate Type: Serous Exudate Color: amber Cleansing: No Wound Bed Sluka, Naveya H. ([id number]) Granulation Amount: Large (67-100%) Exposed Structure Necrotic Amount: None Present (0%) Fascia Exposed: No Fat Layer Exposed: Yes Tendon Exposed: No Muscle Exposed: No Joint Exposed: No Bone Exposed: No Periwound Skin Texture Texture Color No Abnormalities Noted: No No Abnormalities Noted: No Callus: No Atrophie Blanche:  No Crepitus: No Cyanosis: No Excoriation: No Ecchymosis: Yes Fluctuance: No Erythema: No Friable: No Hemosiderin Staining: No Induration: No Mottled: No Localized Edema: No Pallor: No Rash: No Rubor: No Scarring: No Moisture No Abnormalities Noted: No Dry / Scaly: Yes Maceration: No Moist: No Wound Preparation Ulcer Cleansing: Rinsed/Irrigated with Saline Topical Anesthetic Applied: Other: lidociane 4%, Treatment Notes Wound #8 (Right, Lateral Lower Leg) 1. Cleansed with: Cleanse wound with antibacterial soap and water 2. Anesthetic Topical Lidocaine 4% cream to wound bed prior to debridement 4. Dressing Applied: Prisma Ag 5. Secondary Dressing Applied ABD Pad 7. Secured with 3 Layer Compression System - Left Lower Extremity Electronic Signature(s) Signed: 04/24/2016 5:43:35 PM By: Montey Hora Entered By: Montey Hora on 04/24/2016 14:28:51 Votta, [name] ([id number]) ESRA, BARKS ([id number]) -------------------------------------------------------------------------------- Vitals Details Patient Name: [name] Date of Service: 04/24/2016 1:15 PM Medical Record Patient Account Number: [id number] Number: Treating RN: Montey Hora 07-06-1950 (65 y.o. Other Clinician: Date of Birth/Sex: Female) Treating ROBSON, [place] Primary Care Physician: LADA, MELINDA Physician/Extender: G Referring Physician: LADA, MELINDA Weeks in Treatment: 22 Vital Signs Time Taken: 14:17 Temperature (F): 98.3 Height (in): 63 Pulse (bpm): 67 Weight (lbs): 270 Respiratory Rate (breaths/min): 20 Body Mass Index (BMI): 47.8 Blood Pressure (mmHg): 135/61 Reference Range: 80 - 120 mg / dl Electronic Signature(s) Signed: 04/24/2016 5:43:35 PM By: Montey Hora Entered By: Montey Hora on 04/24/2016 14:19:24

## 2016-04-25 NOTE — Progress Notes (Signed)
[name], [name] Visit Report for 04/24/2016 Chief Complaint Document Details Patient Name: [name], [name] Date of Service: 04/24/2016 1:15 PM Medical Record Patient Account Number: [id number] Number: Treating RN: Montey Hora 08-21-1950 (65 y.o. Other Clinician: Date of Birth/Sex: Female) Treating Irbin Fines Primary Care Physician/Extender: Peri Jefferson, [place] Physician: Referring Physician: LADA, MELINDA Weeks in Treatment: 22 Information Obtained from: Patient Chief Complaint returns for evaluation of bilateral lower extremity ulcerations Electronic Signature(s) Signed: 04/24/2016 5:22:07 PM By: Linton Ham MD Entered By: Linton Ham on 04/24/2016 15:32:18 [name] -------------------------------------------------------------------------------- HPI Details Patient Name: [name] Date of Service: 04/24/2016 1:15 PM Medical Record Patient Account Number: [id number] Number: Treating RN: Montey Hora 1951-04-20 (65 y.o. Other Clinician: Date of Birth/Sex: Female) Treating Ariyona Eid Primary Care Physician/Extender: Peri Jefferson, [place] Physician: Referring Physician: LADA, MELINDA Weeks in Treatment: 22 History of Present Illness Location: left and right lower extremity and dorsum of the right foot Quality: Patient reports experiencing a sharp pain to affected area(s). Severity: Patient rates the pain to be a 8 out of 10 especially with palpation Duration: Patient has had the wound for > 6 months prior to seeking treatment at the wound center Timing: Pain in wound is Intermittent in severity but persistent Context: The wound appeared gradually over time Modifying Factors: Other treatment(s) tried include: symptomatic treatment as advised by her PCP Associated Signs and Symptoms: Patient reports having increase swelling iin her bilateral lower extremities HPI Description: 65 year old with a past medical  history significant for [name], urinary incontinence, and obesity. She has been seen in the wound clinic before for lower extremity ulcerations treated with compression therapy. she is also known to have hypertension, peripheral vascular disease, COPD, obstructive sleep apnea, lumbar radiculopathy, kyphoscoliosis, urinary issues and tobacco abuse. Smokes a packet of cigarettes a day was recently seen at the [facility] for swelling of her legs and feet with a ulceration on the dorsum of the right foot which has been there for about 6 months. she was recently in the ER about a month ago where she was seen for shortness of breath and swelling of the legs and a chest x-ray was within normal limits had an increase in her BNP and was given Lasix and put on doxycycline for a mild cellulitis and possible UTI. Wounds aresmaller today 11/13/15. Debrided and will continue Santyl. 12/21/2015 -- she was admitted to the hospital overnight on 12/18/2015 and diagnosed with urinary retention and cellulitis of the left lower leg. is past to take clindamycin and use Santyl for her wounds. 01/15/2016 -- come back to see [place] for almost a month and continues to be noncompliant with her dressings 01/30/16 patient presents today for a follow-up visit concerning her ongoing bilateral lower extremity wounds. We have not seen her for the past 2 weeks although we are supposed to be seen her for weekly visits. She currently has a Foley catheter. She tells me that the wounds are intensely painful especially with pressure and palpation at this point in time. Fortunately she is having no interval signs or symptoms of systemic infection but unfortunately the wounds have not improved dramatically since we last saw her. She does have home health coming to take care of her as well. She is currently not in any compression wraps. 02/06/16 ON evaluation today patient continues to experience discomfort regarding her bilateral  lower extremity ulcers. She has continued to tolerate the dressing changes at this point in time and continues  to have a Foley catheter as well. Fortunately she is back this week in the past she has been somewhat noncompliant with follow-ups I'm glad to see her today. She tells me that her pain level varies but can be as Orf, Manette H. ([id number]) high as a 7 out of 10 with manipulation of the wound. She tells me that she used to be on oxycodone which was managed by the pain clinic although she is no longer on that as she tells me that she was actually smoking marijuana at the time and when this was found out they discontinued her pain medication. She no longer is taking anything pain medication wise and she also does not smoke cigarettes nor marijuana at this point. 02/12/16; this is a patient I don't believe I have previously seen. She has multiple sclerosis. She has 4 punched-out areas on the anterior lateral left leg 2 areas on the right these are all in the same condition. Reasonably small [dime size] wounds each was some depth. Surface of these wounds does not look particularly healthy as there is adherent slough. There is no evidence of surrounding infection or inflammation. ABIs in this clinic were 0.87 on the right and 0.81 on the left. She is listed as having PAD and is a smoker. Not a diabetic 02/20/16 patient I gave antibiotics to last week for erythema around both wound sites on the left lateral leg and right lateral leg. This appears to be a lot better. One of the areas on the left leg is healed however she still has 3 punched-out open areas on the left lateral calf and one on the right lateral calf and one on the dorsal foot. She is an ex-smoker quitting 1 month ago. ABIs in this clinic were 0.87 on the right 0.81 on the left 03/12/16; this is a patient I really don't have a good sense of. It would appear for the first 5 or 6 weeks of her stay in this clinic she was cared for by  Dr. Con Memos. She appeared on my schedule in mid October and I saw her twice. She has not been here however in 3 weeks. She has advanced Wound Care at home where she lives with her husband. She has multiple sclerosis. I saw her the first time she had 4 punched-out small wounds on the anterior lateral left leg it appears that she is now down to 2. She also had wounds on the lateral and medial aspect of her right leg which were also small and punched out. The only one that remains is on the right lateral. Because of the nature of her wound I went ahead and ordered formal arterial studies this showed an ABI of 1 on the right and one on the left. TBI of 1.4 on the right and 0.79 on the left. She had normal waveforms. She was in the emergency room on 02/28/16; there is a noted edema of her left leg after a fall. She had a duplex ultrasound that showed no evidence for an acute DVT from the left groin to the popliteal fossa. The study was limited in the calf veins due to edema. Also noticeable that she had a left inguinal enlarged lymph node up to 6.2 cm. She also had a left knee x-ray that showed no acute findings and a right foot x-ray that showed soft tissue swelling but no radiographic evidence of osteomyelitis. The patient does stop smoking 03-19-16 [name] presents today for evaluation of her bilateral lower  extremity ulcerations. She states that she has not smoked in several weeks. She denies any pain or discomfort to bilateral lower extremities, tolerating compression therapy as ordered. 03/26/16; I have not seen this patient in 2 weeks however she has done very well with improvements in the areas in question. After we obtain normal arterial studies, increasing the 4-layer compression really seems to look done a nice job here. She has one open area on the left lateral leg and one on the medial right leg. These have filled in nicely and are now superficial wounds that showed epithelialized. I note  the left inguinal lymph node at 6.2 cm. I may need to refer this back to the patient's primary doctor. 04/03/16; patient has 2 remaining wounds 1 on the left lateral leg and one on the right medial leg. Both of filled in nicely since we are able to increase her from a 3 layer to a 4 layer compression. I am also following up on the lymph node notable on her left leg DVT rule out in November 04/10/16; area on the right lateral leg is healed. The area on the left leg is still open but looks considerably better with healthy granulation and less wound area. I12/20/17; last week we healed the patient doubt with regards to the wound on the right leg to her on compression stocking 20-30 mmHg as it turns out I don't think she had a stocking, predictably therefore she has reopened. The left leg as well as the wound on the lateral aspect. Been generally small line 04/24/16; we healed out her right leg 2 weeks ago to her own 20-30 mm compression stockings although as it turns out she didn't have these and did not purchase some apparently because of financial issues. The left leg wound is on the lateral aspect. Both of these wounds are just about healed. [name], [name] ([id number]) Electronic Signature(s) Signed: 04/24/2016 5:22:07 PM By: Linton Ham MD Entered By: Linton Ham on 04/24/2016 15:33:52 [name], [name] ([id number]) -------------------------------------------------------------------------------- Physical Exam Details Patient Name: [name] Date of Service: 04/24/2016 1:15 PM Medical Record Patient Account Number: [id number] Number: Treating RN: Montey Hora [birth year] (65 y.o. Other Clinician: Date of Birth/Sex: Female) Treating Jakeel Starliper Primary Care Physician/Extender: Peri Jefferson, [place] Physician: Referring Physician: LADA, MELINDA Weeks in Treatment: 22 Constitutional Sitting or standing Blood Pressure is within target range for patient.. Pulse regular  and within target range for patient.Marland Kitchen Respirations regular, non-labored and within target range.. Temperature is normal and within the target range for the patient.. Patient's appearance is neat and clean. Appears in no acute distress. Well nourished and well developed.. Notes Wound exam; the area on the right leg is just about closed. The area on the left leg is also just about closed. He edema control in both legs is satisfactory. I am of the thighs that these should be both closed next week. She will need compression stockings and we have talked to her about this again Electronic Signature(s) Signed: 04/24/2016 5:22:07 PM By: Linton Ham MD Entered By: Linton Ham on 04/24/2016 15:34:41 [name] ([id number]) -------------------------------------------------------------------------------- Physician Orders Details Patient Name: [name] Date of Service: 04/24/2016 1:15 PM Medical Record Patient Account Number: [id number] Number: Treating RN: Montey Hora 07-16-1950 (65 y.o. Other Clinician: Date of Birth/Sex: Female) Treating Karlene Southard Primary Care Physician/Extender: Peri Jefferson, [place] Physician: Referring Physician: LADA, MELINDA Weeks in Treatment: 8 Verbal / Phone Orders: Yes Clinician: Montey Hora Read Back and Verified: Yes  Diagnosis Coding Wound Cleansing Wound #5 Left,Distal,Lateral Lower Leg o Cleanse wound with mild soap and water - HHRN to wash legs with soap and water o No tub bath. Wound #8 Right,Lateral Lower Leg o Cleanse wound with mild soap and water - HHRN to wash legs with soap and water o No tub bath. Anesthetic Wound #5 Left,Distal,Lateral Lower Leg o Topical Lidocaine 4% cream applied to wound bed prior to debridement - in clinic only. Wound #8 Right,Lateral Lower Leg o Topical Lidocaine 4% cream applied to wound bed prior to debridement - in clinic only. Primary Wound Dressing Wound #5  Left,Distal,Lateral Lower Leg o Prisma Ag - or collagen with silver equivalent - silver alginate is not collagen Wound #8 Right,Lateral Lower Leg o Prisma Ag - or collagen with silver equivalent - silver alginate is not collagen Secondary Dressing Wound #5 Left,Distal,Lateral Lower Leg o ABD pad Wound #8 Right,Lateral Lower Leg o ABD pad Dressing Change Frequency Wound #5 Left,Distal,Lateral Lower Leg [place]ab[place], [place] H. ([id number]) o Three times weekly - [facility] to change Monday and Friday Pt comes to clinic Wednesday Wound #8 Right,Lateral Lower Leg o Three times weekly - [facility] to change Monday and Friday Pt comes to clinic Wednesday Follow-up Appointments Wound #5 Left,Distal,Lateral Lower Leg o Return Appointment in 1 week. Wound #8 Right,Lateral Lower Leg o Return Appointment in 1 week. Edema Control Wound #5 Left,Distal,Lateral Lower Leg o 4 Layer Compression System - Bilateral Wound #8 Right,Lateral Lower Leg o 4 Layer Compression System - Bilateral Additional Orders / Instructions Wound #5 Left,Distal,Lateral Lower Leg o Stop Smoking o Increase protein intake. Home Health Wound #5 Left,Distal,Lateral Lower Leg o Continue Home Health Visits - Advanced---[facility] to change wraps Monday and Friday *********[facility] please order compression hose for patient 30-40 mmHg********* o Home Health Nurse may visit PRN to address patientos wound care needs. o FACE TO FACE ENCOUNTER: MEDICARE and MEDICAID PATIENTS: I certify that this patient is under my care and that I had a face-to-face encounter that meets the physician face-to-face encounter requirements with this patient on this date. The encounter with the patient was in whole or in part for the following MEDICAL CONDITION: (primary reason for [place]) MEDICAL NECESSITY: I certify, that based on my findings, NURSING services are a medically necessary home health service. HOME BOUND STATUS: I certify that my  clinical findings support that this patient is homebound (i.e., Due to illness or injury, pt requires aid of supportive devices such as crutches, cane, wheelchairs, walkers, the use of special transportation or the assistance of another person to leave their place of residence. There is a normal inability to leave the home and doing so requires considerable and taxing effort. Other absences are for medical reasons / religious services and are infrequent or of short duration when for other reasons). o If current dressing causes regression in wound condition, may D/C ordered dressing product/s and apply Normal Saline Moist Dressing daily until next [place] / Other MD appointment. [place] of regression in wound condition at [phone]. o Please direct any NON-WOUND related issues/requests for orders to patient's Primary Care Physician [name], [name] ([id number]) Electronic Signature(s) Signed: 04/24/2016 5:22:07 PM By: Linton Ham MD Signed: 04/24/2016 5:43:35 PM By: Montey Hora Entered By: Montey Hora on 04/24/2016 14:56:38 [name] ([id number]) -------------------------------------------------------------------------------- Problem List Details Patient Name: [name] Date of Service: 04/24/2016 1:15 PM Medical Record Patient Account Number: [id number] Number: Treating RN: Montey Hora Jun 11, 1950 (65 y.o.  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 11/20/2015 Yes exposed L97.512 Non-pressure chronic ulcer of other part of right foot with 11/20/2015 Yes fat layer exposed L97.222 Non-pressure chronic ulcer of left calf with fat layer  11/20/2015 Yes exposed G35 Multiple sclerosis 11/20/2015 Yes E66.01 Morbid (severe) obesity due to excess calories 11/20/2015 Yes F17.218 Nicotine dependence, cigarettes, with other nicotine- 11/20/2015 Yes induced disorders L04.3 Acute lymphadenitis of lower limb 04/03/2016 Yes Inactive Problems Resolved Problems Electronic Signature(s) [name], [name] ([id number]) Signed: 04/24/2016 5:22:07 PM By: Linton Ham MD Entered By: Linton Ham on 04/24/2016 15:31:16 [name] ([id number]) -------------------------------------------------------------------------------- Progress Note Details Patient Name: [name] Date of Service: 04/24/2016 1:15 PM Medical Record Patient Account Number: [id number] Number: Treating RN: Montey Hora 11/16/1950 (65 y.o. Other Clinician: Date of Birth/Sex: Female) Treating Lakeith Careaga Primary Care Physician/Extender: Peri Jefferson, [place] Physician: Referring Physician: LADA, MELINDA Weeks in Treatment: 22 Subjective Chief Complaint Information obtained from Patient returns for evaluation of bilateral lower extremity ulcerations History of Present Illness (HPI) The following HPI elements were documented for the patient's wound: Location: left and right lower extremity and dorsum of the right foot Quality: Patient reports experiencing a sharp pain to affected area(s). Severity: Patient rates the pain to be a 8 out of 10 especially with palpation Duration: Patient has had the wound for > 6 months prior to seeking treatment at the wound center Timing: Pain in wound is Intermittent in severity but persistent Context: The wound appeared gradually over time Modifying Factors: Other treatment(s) tried include: symptomatic treatment as advised by her PCP Associated Signs and Symptoms: Patient reports having increase swelling iin her bilateral lower extremities 65 year old with a past medical history significant for [name], urinary  incontinence, and obesity. She has been seen in the wound clinic before for lower extremity ulcerations treated with compression therapy. she is also known to have hypertension, peripheral vascular disease, COPD, obstructive sleep apnea, lumbar radiculopathy, kyphoscoliosis, urinary issues and tobacco abuse. Smokes a packet of cigarettes a day was recently seen at the [facility] for swelling of her legs and feet with a ulceration on the dorsum of the right foot which has been there for about 6 months. she was recently in the ER about a month ago where she was seen for shortness of breath and swelling of the legs and a chest x-ray was within normal limits had an increase in her BNP and was given Lasix and put on doxycycline for a mild cellulitis and possible UTI. Wounds aresmaller today 11/13/15. Debrided and will continue Santyl. 12/21/2015 -- she was admitted to the hospital overnight on 12/18/2015 and diagnosed with urinary retention and cellulitis of the left lower leg. is past to take clindamycin and use Santyl for her wounds. 01/15/2016 -- come back to see [place] for almost a month and continues to be noncompliant with her dressings 01/30/16 patient presents today for a follow-up visit concerning her ongoing bilateral lower extremity wounds. We have not seen her for the past 2 weeks although we are supposed to be seen her for weekly visits. She currently has a Foley catheter. She tells me that the wounds are intensely painful especially with pressure and palpation at this point in time. Fortunately she is having no interval signs or symptoms of  systemic infection but unfortunately the wounds have not improved dramatically since we last saw her. She does Mcphail, Ahlana H. ([id number]) have home health coming to take care of her as well. She is currently not in any compression wraps. 02/06/16 ON evaluation today patient continues to experience discomfort regarding her bilateral  lower extremity ulcers. She has continued to tolerate the dressing changes at this point in time and continues to have a Foley catheter as well. Fortunately she is back this week in the past she has been somewhat noncompliant with follow-ups I'm glad to see her today. She tells me that her pain level varies but can be as high as a 7 out of 10 with manipulation of the wound. She tells me that she used to be on oxycodone which was managed by the pain clinic although she is no longer on that as she tells me that she was actually smoking marijuana at the time and when this was found out they discontinued her pain medication. She no longer is taking anything pain medication wise and she also does not smoke cigarettes nor marijuana at this point. 02/12/16; this is a patient I don't believe I have previously seen. She has multiple sclerosis. She has 4 punched-out areas on the anterior lateral left leg 2 areas on the right these are all in the same condition. Reasonably small [dime size] wounds each was some depth. Surface of these wounds does not look particularly healthy as there is adherent slough. There is no evidence of surrounding infection or inflammation. ABIs in this clinic were 0.87 on the right and 0.81 on the left. She is listed as having PAD and is a smoker. Not a diabetic 02/20/16 patient I gave antibiotics to last week for erythema around both wound sites on the left lateral leg and right lateral leg. This appears to be a lot better. One of the areas on the left leg is healed however she still has 3 punched-out open areas on the left lateral calf and one on the right lateral calf and one on the dorsal foot. She is an ex-smoker quitting 1 month ago. ABIs in this clinic were 0.87 on the right 0.81 on the left 03/12/16; this is a patient I really don't have a good sense of. It would appear for the first 5 or 6 weeks of her stay in this clinic she was cared for by Dr. Con Memos. She appeared on  my schedule in mid October and I saw her twice. She has not been here however in 3 weeks. She has advanced Wound Care at home where she lives with her husband. She has multiple sclerosis. I saw her the first time she had 4 punched-out small wounds on the anterior lateral left leg it appears that she is now down to 2. She also had wounds on the lateral and medial aspect of her right leg which were also small and punched out. The only one that remains is on the right lateral. Because of the nature of her wound I went ahead and ordered formal arterial studies this showed an ABI of 1 on the right and one on the left. TBI of 1.4 on the right and 0.79 on the left. She had normal waveforms. She was in the emergency room on 02/28/16; there is a noted edema of her left leg after a fall. She had a duplex ultrasound that showed no evidence for an acute DVT from the left groin to the popliteal fossa. The study was  limited in the calf veins due to edema. Also noticeable that she had a left inguinal enlarged lymph node up to 6.2 cm. She also had a left knee x-ray that showed no acute findings and a right foot x-ray that showed soft tissue swelling but no radiographic evidence of osteomyelitis. The patient does stop smoking 03-19-16 [name] presents today for evaluation of her bilateral lower extremity ulcerations. She states that she has not smoked in several weeks. She denies any pain or discomfort to bilateral lower extremities, tolerating compression therapy as ordered. 03/26/16; I have not seen this patient in 2 weeks however she has done very well with improvements in the areas in question. After we obtain normal arterial studies, increasing the 4-layer compression really seems to look done a nice job here. She has one open area on the left lateral leg and one on the medial right leg. These have filled in nicely and are now superficial wounds that showed epithelialized. I note the left inguinal lymph node  at 6.2 cm. I may need to refer this back to the patient's primary doctor. 04/03/16; patient has 2 remaining wounds 1 on the left lateral leg and one on the right medial leg. Both of filled in nicely since we are able to increase her from a 3 layer to a 4 layer compression. I am also following up on the lymph node notable on her left leg DVT rule out in November 04/10/16; area on the right lateral leg is healed. The area on the left leg is still open but looks considerably better with healthy granulation and less wound area. I12/20/17; last week we healed the patient doubt with regards to the wound on the right leg to her on [name], [name] H. ([id number]) compression stocking 20-30 mmHg as it turns out I don't think she had a stocking, predictably therefore she has reopened. The left leg as well as the wound on the lateral aspect. Been generally small line 04/24/16; we healed out her right leg 2 weeks ago to her own 20-30 mm compression stockings although as it turns out she didn't have these and did not purchase some apparently because of financial issues. The left leg wound is on the lateral aspect. Both of these wounds are just about healed. Objective Constitutional Sitting or standing Blood Pressure is within target range for patient.. Pulse regular and within target range for patient.Marland Kitchen Respirations regular, non-labored and within target range.. Temperature is normal and within the target range for the patient.. Patient's appearance is neat and clean. Appears in no acute distress. Well nourished and well developed.. Vitals Time Taken: 2:17 PM, Height: 63 in, Weight: 270 lbs, BMI: 47.8, Temperature: 98.3 F, Pulse: 67 bpm, Respiratory Rate: 20 breaths/min, Blood Pressure: 135/61 mmHg. General Notes: Wound exam; the area on the right leg is just about closed. The area on the left leg is also just about closed. He edema control in both legs is satisfactory. I am of the thighs that these should be  both closed next week. She will need compression stockings and we have talked to her about this again Integumentary (Hair, Skin) Wound #5 status is Open. Original cause of wound was Gradually Appeared. The wound is located on the Left,Distal,Lateral Lower Leg. The wound measures 0.1cm length x 0.1cm width x 0.1cm depth; 0.008cm^2 area and 0.001cm^3 volume. The wound is limited to skin breakdown. There is no tunneling or undermining noted. There is a medium amount of serous drainage noted. The wound margin is  flat and intact. There is large (67-100%) pink granulation within the wound bed. There is no necrotic tissue within the wound bed. The periwound skin appearance exhibited: Induration, Dry/Scaly. The periwound skin appearance did not exhibit: Callus, Crepitus, Excoriation, Fluctuance, Friable, Localized Edema, Rash, Scarring, Maceration, Moist, Atrophie Blanche, Cyanosis, Ecchymosis, Hemosiderin Staining, Mottled, Pallor, Rubor, Erythema. Periwound temperature was noted as No Abnormality. The periwound has tenderness on palpation. Wound #8 status is Open. Original cause of wound was Gradually Appeared. The wound is located on the Right,Lateral Lower Leg. The wound measures 0.5cm length x 0.6cm width x 0.1cm depth; 0.236cm^2 area and 0.024cm^3 volume. There is fat exposed. There is no tunneling or undermining noted. There is a medium amount of serous drainage noted. The wound margin is flat and intact. There is large (67-100%) granulation within the wound bed. There is no necrotic tissue within the wound bed. The periwound skin appearance exhibited: Dry/Scaly, Ecchymosis. The periwound skin appearance did not exhibit: Callus, Crepitus, Excoriation, Fluctuance, Friable, Induration, Localized Edema, Rash, Scarring, Maceration, Moist, Atrophie Blanche, Cyanosis, Hemosiderin Staining, Mottled, Pallor, Rubor, Erythema. 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 Wound #8 Right,Lateral Lower Leg: Cleanse wound with mild soap and water - HHRN to wash legs with soap and water No tub bath. Anesthetic: Wound #5 Left,Distal,Lateral Lower Leg: Topical Lidocaine 4% cream applied to wound bed prior to debridement - in clinic only. Wound #8 Right,Lateral Lower Leg: Topical Lidocaine 4% cream applied to wound bed prior to debridement - in clinic only. Primary Wound Dressing: Wound #5 Left,Distal,Lateral Lower Leg: Prisma Ag - or collagen with silver equivalent - silver alginate is not collagen Wound #8 Right,Lateral Lower Leg: Prisma Ag - or collagen with silver equivalent - silver alginate is not collagen Secondary Dressing: Wound #5 Left,Distal,Lateral Lower Leg: ABD pad Wound #8 Right,Lateral Lower Leg: ABD pad Dressing Change Frequency: Wound #5 Left,Distal,Lateral Lower Leg: Three times weekly - [facility] to change Monday and Friday Pt comes to clinic Wednesday Wound #8 Right,Lateral Lower Leg: Three times weekly - [facility] to change Monday and Friday Pt comes to clinic Wednesday Follow-up Appointments: [name], [name] Wound #5 Left,Distal,Lateral Lower Leg: Return Appointment in 1 week. Wound #8 Right,Lateral Lower Leg: Return Appointment in 1 week. Edema Control: Wound #5 Left,Distal,Lateral Lower Leg: 4 Layer Compression System - Bilateral Wound #8  Right,Lateral Lower Leg: 4 Layer Compression System - Bilateral Additional Orders / Instructions: Wound #5 Left,Distal,Lateral Lower Leg: Stop Smoking Increase protein intake. Home Health: Wound #5 Left,Distal,Lateral Lower Leg: Continue Home Health Visits - [facility]---[facility] to change wraps Monday and Friday *********[name] please order compression hose for patient 30-40 mmHg********* Home Health Nurse may visit PRN to address patient s wound care needs. FACE TO FACE ENCOUNTER: MEDICARE and MEDICAID PATIENTS: I certify that this patient is under my care and that I had a face-to-face encounter that meets the physician face-to-face encounter requirements with this patient on this date. The  encounter with the patient was in whole or in part for the following MEDICAL CONDITION: (primary reason for Lily) MEDICAL NECESSITY: I certify, that based on my findings, NURSING services are a medically necessary home health service. HOME BOUND STATUS: I certify that my clinical findings support that this patient is homebound (i.e., Due to illness or injury, pt requires aid of supportive devices such as crutches, cane, wheelchairs, walkers, the use of special transportation or the assistance of another person to leave their place of residence. There is a normal inability to leave the home and doing so requires considerable and taxing effort. Other absences are for medical reasons / religious services and are infrequent or of short duration when for other reasons). If current dressing causes regression in wound condition, may D/C ordered dressing product/s and apply Normal Saline Moist Dressing daily until next [place] / Other MD appointment. [place] of regression in wound condition at [phone]. Please direct any NON-WOUND related issues/requests for orders to patient's Primary Care Physician #1 we are going to use Prisma to both wound sites bilaterally. ABDs  bilaterally an 4-layer compression bilaterally. Once again the should be closed next week and ready for some form of compression garment we'll have to see if she is able to come up with these. We gave her the instructions for elastic therapy and [place] Electronic Signature(s) Signed: 04/24/2016 5:22:07 PM By: Linton Ham MD Entered By: Linton Ham on 04/24/2016 15:35:54 [name] ([id number]) -------------------------------------------------------------------------------- SuperBill Details Patient Name: [name] Date of Service: 04/24/2016 Medical Record Patient Account Number: [id number] Number: Treating RN: Montey Hora [birth year] (65 y.o. Other Clinician: Date of Birth/Sex: Female) Treating Jasaun Carn Primary Care Physician/Extender: Peri Jefferson, [place] Physician: Weeks in Treatment: 22 Referring Physician: LADA, MELINDA Diagnosis Coding ICD-10 Codes Code Description [phone] Non-pressure chronic ulcer of right calf with fat layer exposed L97.512 Non-pressure chronic ulcer of other part of right foot with fat layer exposed L97.222 Non-pressure chronic ulcer of left calf with fat layer exposed G35 Multiple sclerosis E66.01 Morbid (severe) obesity due to excess calories F17.218 Nicotine dependence, cigarettes, with other nicotine-induced disorders L04.3 Acute lymphadenitis of lower limb Facility Procedures CPT4: Description Modifier Quantity Code [id number] BILATERAL: Application of multi-layer venous compression 1 system; leg (below knee), including ankle and foot. Physician Procedures CPT4 Code: [id number] Description: [phone] - WC PHYS LEVEL 2 - EST PT ICD-10 Description Diagnosis L97.212 Non-pressure chronic ulcer of right calf with fat L97.222 Non-pressure chronic ulcer of left calf with fat l Modifier: layer exposed ayer exposed Quantity: 1 Electronic Signature(s) Signed: 04/24/2016 4:53:36 PM By: Montey Hora Signed: 04/24/2016  5:22:07 PM By: Linton Ham MD Entered By: Montey Hora on 04/24/2016 16:53:36

## 2016-04-26 DIAGNOSIS — J449 Chronic obstructive pulmonary disease, unspecified: Secondary | ICD-10-CM | POA: Diagnosis not present

## 2016-04-26 DIAGNOSIS — I70234 Atherosclerosis of native arteries of right leg with ulceration of heel and midfoot: Secondary | ICD-10-CM | POA: Diagnosis not present

## 2016-04-26 DIAGNOSIS — Z792 Long term (current) use of antibiotics: Secondary | ICD-10-CM | POA: Diagnosis not present

## 2016-04-26 DIAGNOSIS — G35 Multiple sclerosis: Secondary | ICD-10-CM | POA: Diagnosis not present

## 2016-04-26 DIAGNOSIS — I70232 Atherosclerosis of native arteries of right leg with ulceration of calf: Secondary | ICD-10-CM | POA: Diagnosis not present

## 2016-04-26 DIAGNOSIS — L03116 Cellulitis of left lower limb: Secondary | ICD-10-CM | POA: Diagnosis not present

## 2016-04-26 DIAGNOSIS — R339 Retention of urine, unspecified: Secondary | ICD-10-CM | POA: Diagnosis not present

## 2016-04-26 DIAGNOSIS — L97219 Non-pressure chronic ulcer of right calf with unspecified severity: Secondary | ICD-10-CM | POA: Diagnosis not present

## 2016-04-26 DIAGNOSIS — I70249 Atherosclerosis of native arteries of left leg with ulceration of unspecified site: Secondary | ICD-10-CM | POA: Diagnosis not present

## 2016-04-26 DIAGNOSIS — L97929 Non-pressure chronic ulcer of unspecified part of left lower leg with unspecified severity: Secondary | ICD-10-CM | POA: Diagnosis not present

## 2016-04-26 DIAGNOSIS — L97419 Non-pressure chronic ulcer of right heel and midfoot with unspecified severity: Secondary | ICD-10-CM | POA: Diagnosis not present

## 2016-04-26 DIAGNOSIS — I10 Essential (primary) hypertension: Secondary | ICD-10-CM | POA: Diagnosis not present

## 2016-04-30 DIAGNOSIS — L03116 Cellulitis of left lower limb: Secondary | ICD-10-CM | POA: Diagnosis not present

## 2016-05-01 ENCOUNTER — Encounter: Payer: Medicare Other | Attending: Internal Medicine | Admitting: Internal Medicine

## 2016-05-01 DIAGNOSIS — F17218 Nicotine dependence, cigarettes, with other nicotine-induced disorders: Secondary | ICD-10-CM | POA: Insufficient documentation

## 2016-05-01 DIAGNOSIS — L97212 Non-pressure chronic ulcer of right calf with fat layer exposed: Secondary | ICD-10-CM | POA: Insufficient documentation

## 2016-05-01 DIAGNOSIS — Z792 Long term (current) use of antibiotics: Secondary | ICD-10-CM | POA: Diagnosis not present

## 2016-05-01 DIAGNOSIS — J449 Chronic obstructive pulmonary disease, unspecified: Secondary | ICD-10-CM | POA: Diagnosis not present

## 2016-05-01 DIAGNOSIS — R32 Unspecified urinary incontinence: Secondary | ICD-10-CM | POA: Diagnosis not present

## 2016-05-01 DIAGNOSIS — L97229 Non-pressure chronic ulcer of left calf with unspecified severity: Secondary | ICD-10-CM | POA: Diagnosis not present

## 2016-05-01 DIAGNOSIS — Z6841 Body Mass Index (BMI) 40.0 and over, adult: Secondary | ICD-10-CM | POA: Insufficient documentation

## 2016-05-01 DIAGNOSIS — G4733 Obstructive sleep apnea (adult) (pediatric): Secondary | ICD-10-CM | POA: Diagnosis not present

## 2016-05-01 DIAGNOSIS — L97419 Non-pressure chronic ulcer of right heel and midfoot with unspecified severity: Secondary | ICD-10-CM | POA: Diagnosis not present

## 2016-05-01 DIAGNOSIS — L97222 Non-pressure chronic ulcer of left calf with fat layer exposed: Secondary | ICD-10-CM | POA: Insufficient documentation

## 2016-05-01 DIAGNOSIS — G35 Multiple sclerosis: Secondary | ICD-10-CM | POA: Insufficient documentation

## 2016-05-01 DIAGNOSIS — L043 Acute lymphadenitis of lower limb: Secondary | ICD-10-CM | POA: Insufficient documentation

## 2016-05-01 DIAGNOSIS — F419 Anxiety disorder, unspecified: Secondary | ICD-10-CM | POA: Insufficient documentation

## 2016-05-01 DIAGNOSIS — L97929 Non-pressure chronic ulcer of unspecified part of left lower leg with unspecified severity: Secondary | ICD-10-CM | POA: Diagnosis not present

## 2016-05-01 DIAGNOSIS — I739 Peripheral vascular disease, unspecified: Secondary | ICD-10-CM | POA: Diagnosis not present

## 2016-05-01 DIAGNOSIS — Z79899 Other long term (current) drug therapy: Secondary | ICD-10-CM | POA: Diagnosis not present

## 2016-05-01 DIAGNOSIS — I70232 Atherosclerosis of native arteries of right leg with ulceration of calf: Secondary | ICD-10-CM | POA: Diagnosis not present

## 2016-05-01 DIAGNOSIS — I70234 Atherosclerosis of native arteries of right leg with ulceration of heel and midfoot: Secondary | ICD-10-CM | POA: Diagnosis not present

## 2016-05-01 DIAGNOSIS — L97512 Non-pressure chronic ulcer of other part of right foot with fat layer exposed: Secondary | ICD-10-CM | POA: Diagnosis not present

## 2016-05-01 DIAGNOSIS — R339 Retention of urine, unspecified: Secondary | ICD-10-CM | POA: Diagnosis not present

## 2016-05-01 DIAGNOSIS — L97219 Non-pressure chronic ulcer of right calf with unspecified severity: Secondary | ICD-10-CM | POA: Diagnosis not present

## 2016-05-01 DIAGNOSIS — I10 Essential (primary) hypertension: Secondary | ICD-10-CM | POA: Insufficient documentation

## 2016-05-01 DIAGNOSIS — I70249 Atherosclerosis of native arteries of left leg with ulceration of unspecified site: Secondary | ICD-10-CM | POA: Diagnosis not present

## 2016-05-01 DIAGNOSIS — L03116 Cellulitis of left lower limb: Secondary | ICD-10-CM | POA: Diagnosis not present

## 2016-05-02 NOTE — Progress Notes (Signed)
[name], [name] ([id number]) Visit Report for 05/01/2016 Chief Complaint Document Details Patient Name: [name], [name] Date of Service: 05/01/2016 2:00 PM Medical Record Patient Account Number: [id number] Number: Treating RN: Montey Hora 10-04-1950 (66 y.o. Other Clinician: Date of Birth/Sex: Female) Treating ROBSON, MICHAEL Primary Care Physician/Extender: Peri Jefferson, [place] Physician: Referring Physician: LADA, MELINDA Weeks in Treatment: 23 Information Obtained from: Patient Chief Complaint returns for evaluation of bilateral lower extremity ulcerations Electronic Signature(s) Signed: 05/01/2016 5:37:12 PM By: Linton Ham MD Entered By: Linton Ham on 05/01/2016 15:31:36 [name] ([id number]) -------------------------------------------------------------------------------- HPI Details Patient Name: [name] Date of Service: 05/01/2016 2:00 PM Medical Record Patient Account Number: [id number] Number: Treating RN: Montey Hora 1951-02-14 (66 y.o. Other Clinician: Date of Birth/Sex: Female) Treating ROBSON, MICHAEL Primary Care Physician/Extender: Peri Jefferson, [place] Physician: Referring Physician: LADA, MELINDA Weeks in Treatment: 23 History of Present Illness Location: left and right lower extremity and dorsum of the right foot Quality: Patient reports experiencing a sharp pain to affected area(s). Severity: Patient rates the pain to be a 8 out of 10 especially with palpation Duration: Patient has had the wound for > 6 months prior to seeking treatment at the wound center Timing: Pain in wound is Intermittent in severity but persistent Context: The wound appeared gradually over time Modifying Factors: Other treatment(s) tried include: symptomatic treatment as advised by her PCP Associated Signs and Symptoms: Patient reports having increase swelling iin her bilateral lower extremities HPI Description: 66 year old with a past medical history  significant for MS, urinary incontinence, and obesity. She has been seen in the wound clinic before for lower extremity ulcerations treated with compression therapy. she is also known to have hypertension, peripheral vascular disease, COPD, obstructive sleep apnea, lumbar radiculopathy, kyphoscoliosis, urinary issues and tobacco abuse. Smokes a packet of cigarettes a day was recently seen at the [facility] for swelling of her legs and feet with a ulceration on the dorsum of the right foot which has been there for about 6 months. she was recently in the ER about a month ago where she was seen for shortness of breath and swelling of the legs and a chest x-ray was within normal limits had an increase in her BNP and was given Lasix and put on doxycycline for a mild cellulitis and possible UTI. Wounds aresmaller today 11/13/15. Debrided and will continue Santyl. 12/21/2015 -- she was admitted to the hospital overnight on 12/18/2015 and diagnosed with urinary retention and cellulitis of the left lower leg. is past to take clindamycin and use Santyl for her wounds. 01/15/2016 -- come back to see [place] for almost a month and continues to be noncompliant with her dressings 01/30/16 patient presents today for a follow-up visit concerning her ongoing bilateral lower extremity wounds. We have not seen her for the past 2 weeks although we are supposed to be seen her for weekly visits. She currently has a Foley catheter. She tells me that the wounds are intensely painful especially with pressure and palpation at this point in time. Fortunately she is having no interval signs or symptoms of systemic infection but unfortunately the wounds have not improved dramatically since we last saw her. She does have home health coming to take care of her as well. She is currently not in any compression wraps. 02/06/16 ON evaluation today patient continues to experience discomfort regarding her bilateral  lower extremity ulcers. She has continued to tolerate the dressing changes at this point in time and continues  to have a Foley catheter as well. Fortunately she is back this week in the past she has been somewhat noncompliant with follow-ups I'm glad to see her today. She tells me that her pain level varies but can be as Defibaugh, [name] ([id number]) high as a 7 out of 10 with manipulation of the wound. She tells me that she used to be on oxycodone which was managed by the pain clinic although she is no longer on that as she tells me that she was actually smoking marijuana at the time and when this was found out they discontinued her pain medication. She no longer is taking anything pain medication wise and she also does not smoke cigarettes nor marijuana at this point. 02/12/16; this is a patient I don't believe I have previously seen. She has multiple sclerosis. She has 4 punched-out areas on the anterior lateral left leg 2 areas on the right these are all in the same condition. Reasonably small [dime size] wounds each was some depth. Surface of these wounds does not look particularly healthy as there is adherent slough. There is no evidence of surrounding infection or inflammation. ABIs in this clinic were 0.87 on the right and 0.81 on the left. She is listed as having PAD and is a smoker. Not a diabetic 02/20/16 patient I gave antibiotics to last week for erythema around both wound sites on the left lateral leg and right lateral leg. This appears to be a lot better. One of the areas on the left leg is healed however she still has 3 punched-out open areas on the left lateral calf and one on the right lateral calf and one on the dorsal foot. She is an ex-smoker quitting 1 month ago. ABIs in this clinic were 0.87 on the right 0.81 on the left 03/12/16; this is a patient I really don't have a good sense of. It would appear for the first 5 or 6 weeks of her stay in this clinic she was cared for by  Dr. Con Memos. She appeared on my schedule in mid October and I saw her twice. She has not been here however in 3 weeks. She has advanced Wound Care at home where she lives with her husband. She has multiple sclerosis. I saw her the first time she had 4 punched-out small wounds on the anterior lateral left leg it appears that she is now down to 2. She also had wounds on the lateral and medial aspect of her right leg which were also small and punched out. The only one that remains is on the right lateral. Because of the nature of her wound I went ahead and ordered formal arterial studies this showed an ABI of 1 on the right and one on the left. TBI of 1.4 on the right and 0.79 on the left. She had normal waveforms. She was in the emergency room on 02/28/16; there is a noted edema of her left leg after a fall. She had a duplex ultrasound that showed no evidence for an acute DVT from the left groin to the popliteal fossa. The study was limited in the calf veins due to edema. Also noticeable that she had a left inguinal enlarged lymph node up to 6.2 cm. She also had a left knee x-ray that showed no acute findings and a right foot x-ray that showed soft tissue swelling but no radiographic evidence of osteomyelitis. The patient does stop smoking 03-19-16 Ms Frisinger presents today for evaluation of her bilateral lower  extremity ulcerations. She states that she has not smoked in several weeks. She denies any pain or discomfort to bilateral lower extremities, tolerating compression therapy as ordered. 03/26/16; I have not seen this patient in 2 weeks however she has done very well with improvements in the areas in question. After we obtain normal arterial studies, increasing the 4-layer compression really seems to look done a nice job here. She has one open area on the left lateral leg and one on the medial right leg. These have filled in nicely and are now superficial wounds that showed epithelialized. I note  the left inguinal lymph node at 6.2 cm. I may need to refer this back to the patient's primary doctor. 04/03/16; patient has 2 remaining wounds 1 on the left lateral leg and one on the right medial leg. Both of filled in nicely since we are able to increase her from a 3 layer to a 4 layer compression. I am also following up on the lymph node notable on her left leg DVT rule out in November 04/10/16; area on the right lateral leg is healed. The area on the left leg is still open but looks considerably better with healthy granulation and less wound area. I12/20/17; last week we healed the patient doubt with regards to the wound on the right leg to her on compression stocking 20-30 mmHg as it turns out I don't think she had a stocking, predictably therefore she has reopened. The left leg as well as the wound on the lateral aspect. Been generally small line 04/24/16; we healed out her right leg 2 weeks ago to her own 20-30 mm compression stockings although as it turns out she didn't have these and did not purchase some apparently because of financial issues. The left leg wound is on the lateral aspect. Both of these wounds are just about healed. 05/01/16; her wounds on the left leg are healed out today. The area on the right leg is also healed. Vitamin Locastro, Eddye H. ([id number]) diligent effort of our staff we have not been able to get any form of compression stocking to this patient. The orders for juxtalite's were given to home health this never materialized. We ordered compression stockings I don't think she was able to afford these. We had a discussion today with both the patient and her husband without these, will accumulate edema and the wounds will simply reopen again. Electronic Signature(s) Signed: 05/01/2016 5:37:12 PM By: Linton Ham MD Entered By: Linton Ham on 05/01/2016 15:36:16 [name]  ([id number]) -------------------------------------------------------------------------------- Physical Exam Details Patient Name: [name] Date of Service: 05/01/2016 2:00 PM Medical Record Patient Account Number: [id number] Number: Treating RN: Montey Hora Apr 20, 1951 (66 y.o. Other Clinician: Date of Birth/Sex: Female) Treating ROBSON, MICHAEL Primary Care Physician/Extender: Peri Jefferson, [place] Physician: Referring Physician: LADA, MELINDA Weeks in Treatment: 34 Constitutional Patient is hypertensive.. Pulse regular and within target range for patient.Marland Kitchen Respirations regular, non-labored and within target range.. Temperature is normal and within the target range for the patient.. Patient's appearance is neat and clean. Appears in no acute distress. Well nourished and well developed.. Eyes Conjunctivae clear. No discharge.. Cardiovascular Heart rhythm and rate regular, without murmur or gallop. No evidence of CHF. Pedal pulses are palpable.. Edema present in both extremities. Edema control is adequate with the stockings we put on. She has lymphedema above the wraps. Lymphatic None palpable in the popliteal or inguinal area. Psychiatric No evidence of depression, anxiety, or agitation. Calm, cooperative, and communicative.  Appropriate interactions and affect.. Notes Wound exam; the area on the right leg is closed. The area on the left leg is also closed. Her edema control in both her lower extremities is adequate with are wraps. We have not been able to obtain compression stockings in spite of the diligent efforts of our staff Electronic Signature(s) Signed: 05/01/2016 5:37:12 PM By: Linton Ham MD Entered By: Linton Ham on 05/01/2016 15:38:29 [name] ([id number]) -------------------------------------------------------------------------------- Physician Orders Details Patient Name: [name] Date of Service: 05/01/2016 2:00 PM Medical Record  Patient Account Number: [id number] Number: Treating RN: Montey Hora April 24, 1951 (66 y.o. Other Clinician: Date of Birth/Sex: Female) Treating ROBSON, MICHAEL Primary Care Physician/Extender: Peri Jefferson, [place] Physician: Referring Physician: LADA, MELINDA Weeks in Treatment: 63 Verbal / Phone Orders: Yes Clinician: Montey Hora Read Back and Verified: Yes Diagnosis [place] Discharge From [facility] o Discharge from [place] Signature(s) Signed: 05/01/2016 5:37:12 PM By: Linton Ham MD Signed: 05/01/2016 6:00:22 PM By: Montey Hora Entered By: Montey Hora on 05/01/2016 15:22:12 [name] ([id number]) -------------------------------------------------------------------------------- Problem List Details Patient Name: [name] Date of Service: 05/01/2016 2:00 PM Medical Record Patient Account Number: [id number] Number: Treating RN: Montey Hora [birth year] (66 y.o. 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 11/20/2015 Yes exposed L97.512 Non-pressure chronic ulcer of other part of right foot with 11/20/2015 Yes fat layer exposed L97.222 Non-pressure chronic ulcer of left calf with fat layer 11/20/2015 Yes exposed G35 Multiple sclerosis 11/20/2015 Yes E66.01 Morbid (severe) obesity due to excess calories 11/20/2015 Yes F17.218 Nicotine dependence, cigarettes, with other nicotine- 11/20/2015 Yes induced disorders L04.3 Acute lymphadenitis of lower limb 04/03/2016 Yes Inactive Problems Resolved Problems Electronic Signature(s) CORALYN, KOENNING ([id number]) Signed: 05/01/2016 5:37:12 PM By: Linton Ham  MD Entered By: Linton Ham on 05/01/2016 15:31:05 [name] ([id number]) -------------------------------------------------------------------------------- Progress Note Details Patient Name: [name] Date of Service: 05/01/2016 2:00 PM Medical Record Patient Account Number: [id number] Number: Treating RN: Montey Hora 13-Aug-1950 (66 y.o. Other Clinician: Date of Birth/Sex: Female) Treating ROBSON, MICHAEL Primary Care Physician/Extender: Peri Jefferson, [place] Physician: Referring Physician: LADA, MELINDA Weeks in Treatment: 68 Subjective Chief Complaint Information obtained from Patient returns for evaluation of bilateral lower extremity ulcerations History of Present Illness (HPI) The following HPI elements were documented for the patient's wound: Location: left and right lower extremity and dorsum of the right foot Quality: Patient reports experiencing a sharp pain to affected area(s). Severity: Patient rates the pain to be a 8 out of 10 especially with palpation Duration: Patient has had the wound for > 6 months prior to seeking treatment at the wound center Timing: Pain in wound is Intermittent in severity but persistent Context: The wound appeared gradually over time Modifying Factors: Other treatment(s) tried include: symptomatic treatment as advised by her PCP Associated Signs and Symptoms: Patient reports having increase swelling iin her bilateral lower extremities 66 year old with a past medical history significant for MS, urinary incontinence, and obesity. She has been seen in the wound clinic before for lower extremity ulcerations treated with compression therapy. she is also known to have hypertension, peripheral vascular disease, COPD, obstructive sleep apnea, lumbar radiculopathy, kyphoscoliosis, urinary issues and tobacco abuse. Smokes a packet of cigarettes  a day was recently seen at the [facility] for swelling of her legs and feet  with a ulceration on the dorsum of the right foot which has been there for about 6 months. she was recently in the ER about a month ago where she was seen for shortness of breath and swelling of the legs and a chest x-ray was within normal limits had an increase in her BNP and was given Lasix and put on doxycycline for a mild cellulitis and possible UTI. Wounds aresmaller today 11/13/15. Debrided and will continue Santyl. 12/21/2015 -- she was admitted to the hospital overnight on 12/18/2015 and diagnosed with urinary retention and cellulitis of the left lower leg. is past to take clindamycin and use Santyl for her wounds. 01/15/2016 -- come back to see [place] for almost a month and continues to be noncompliant with her dressings 01/30/16 patient presents today for a follow-up visit concerning her ongoing bilateral lower extremity wounds. We have not seen her for the past 2 weeks although we are supposed to be seen her for weekly visits. She currently has a Foley catheter. She tells me that the wounds are intensely painful especially with pressure and palpation at this point in time. Fortunately she is having no interval signs or symptoms of systemic infection but unfortunately the wounds have not improved dramatically since we last saw her. She does Kneece, Lanna H. ([id number]) have home health coming to take care of her as well. She is currently not in any compression wraps. 02/06/16 ON evaluation today patient continues to experience discomfort regarding her bilateral lower extremity ulcers. She has continued to tolerate the dressing changes at this point in time and continues to have a Foley catheter as well. Fortunately she is back this week in the past she has been somewhat noncompliant with follow-ups I'm glad to see her today. She tells me that her pain level varies but can be as high as a 7 out of 10 with manipulation of the wound. She tells me that she used to be on oxycodone which was  managed by the pain clinic although she is no longer on that as she tells me that she was actually smoking marijuana at the time and when this was found out they discontinued her pain medication. She no longer is taking anything pain medication wise and she also does not smoke cigarettes nor marijuana at this point. 02/12/16; this is a patient I don't believe I have previously seen. She has multiple sclerosis. She has 4 punched-out areas on the anterior lateral left leg 2 areas on the right these are all in the same condition. Reasonably small [dime size] wounds each was some depth. Surface of these wounds does not look particularly healthy as there is adherent slough. There is no evidence of surrounding infection or inflammation. ABIs in this clinic were 0.87 on the right and 0.81 on the left. She is listed as having PAD and is a smoker. Not a diabetic 02/20/16 patient I gave antibiotics to last week for erythema around both wound sites on the left lateral leg and right lateral leg. This appears to be a lot better. One of the areas on the left leg is healed however she still has 3 punched-out open areas on the left lateral calf and one on the right lateral calf and one on the dorsal foot. She is an ex-smoker quitting 1 month ago. ABIs in this clinic were 0.87 on the right 0.81 on the left 03/12/16;  this is a patient I really don't have a good sense of. It would appear for the first 5 or 6 weeks of her stay in this clinic she was cared for by Dr. Con Memos. She appeared on my schedule in mid October and I saw her twice. She has not been here however in 3 weeks. She has advanced Wound Care at home where she lives with her husband. She has multiple sclerosis. I saw her the first time she had 4 punched-out small wounds on the anterior lateral left leg it appears that she is now down to 2. She also had wounds on the lateral and medial aspect of her right leg which were also small and punched out. The  only one that remains is on the right lateral. Because of the nature of her wound I went ahead and ordered formal arterial studies this showed an ABI of 1 on the right and one on the left. TBI of 1.4 on the right and 0.79 on the left. She had normal waveforms. She was in the emergency room on 02/28/16; there is a noted edema of her left leg after a fall. She had a duplex ultrasound that showed no evidence for an acute DVT from the left groin to the popliteal fossa. The study was limited in the calf veins due to edema. Also noticeable that she had a left inguinal enlarged lymph node up to 6.2 cm. She also had a left knee x-ray that showed no acute findings and a right foot x-ray that showed soft tissue swelling but no radiographic evidence of osteomyelitis. The patient does stop smoking 03-19-16 Ms Damian presents today for evaluation of her bilateral lower extremity ulcerations. She states that she has not smoked in several weeks. She denies any pain or discomfort to bilateral lower extremities, tolerating compression therapy as ordered. 03/26/16; I have not seen this patient in 2 weeks however she has done very well with improvements in the areas in question. After we obtain normal arterial studies, increasing the 4-layer compression really seems to look done a nice job here. She has one open area on the left lateral leg and one on the medial right leg. These have filled in nicely and are now superficial wounds that showed epithelialized. I note the left inguinal lymph node at 6.2 cm. I may need to refer this back to the patient's primary doctor. 04/03/16; patient has 2 remaining wounds 1 on the left lateral leg and one on the right medial leg. Both of filled in nicely since we are able to increase her from a 3 layer to a 4 layer compression. I am also following up on the lymph node notable on her left leg DVT rule out in November 04/10/16; area on the right lateral leg is healed. The area on the  left leg is still open but looks considerably better with healthy granulation and less wound area. I12/20/17; last week we healed the patient doubt with regards to the wound on the right leg to her on [place], Yarelie H. ([id number]) compression stocking 20-30 mmHg as it turns out I don't think she had a stocking, predictably therefore she has reopened. The left leg as well as the wound on the lateral aspect. Been generally small line 04/24/16; we healed out her right leg 2 weeks ago to her own 20-30 mm compression stockings although as it turns out she didn't have these and did not purchase some apparently because of financial issues. The left leg wound is on  the lateral aspect. Both of these wounds are just about healed. 05/01/16; her wounds on the left leg are healed out today. The area on the right leg is also healed. Vitamin diligent effort of our staff we have not been able to get any form of compression stocking to this patient. The orders for juxtalite's were given to home health this never materialized. We ordered compression stockings I don't think she was able to afford these. We had a discussion today with both the patient and her husband without these, will accumulate edema and the wounds will simply reopen again. Objective Constitutional Patient is hypertensive.. Pulse regular and within target range for patient.Marland Kitchen Respirations regular, non-labored and within target range.. Temperature is normal and within the target range for the patient.. Patient's appearance is neat and clean. Appears in no acute distress. Well nourished and well developed.. Vitals Time Taken: 2:44 PM, Height: 63 in, Weight: 270 lbs, BMI: 47.8, Temperature: 98.1 F, Pulse: 74 bpm, Respiratory Rate: 20 breaths/min, Blood Pressure: 148/81 mmHg. Eyes Conjunctivae clear. No discharge.. Cardiovascular Heart rhythm and rate regular, without murmur or gallop. No evidence of CHF. Pedal pulses are palpable.. Edema present  in both extremities. Edema control is adequate with the stockings we put on. She has lymphedema above the wraps. Lymphatic None palpable in the popliteal or inguinal area. Psychiatric No evidence of depression, anxiety, or agitation. Calm, cooperative, and communicative. Appropriate interactions and affect.. General Notes: Wound exam; the area on the right leg is closed. The area on the left leg is also closed. Her edema control in both her lower extremities is adequate with are wraps. We have not been able to obtain compression stockings in spite of the diligent efforts of our staff Integumentary (Hair, Skin) Wound #5 status is Open. Original cause of wound was Gradually Appeared. The wound is located on the TYJANAE, CARRERAS. ([id number]) Left,Distal,Lateral Lower Leg. The wound measures 0cm length x 0cm width x 0cm depth; 0cm^2 area and 0cm^3 volume. Wound #8 status is Open. Original cause of wound was Gradually Appeared. The wound is located on the Right,Lateral Lower Leg. The wound measures 0cm length x 0cm width x 0cm depth; 0cm^2 area and 0cm^3 volume. 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 #3 she does not have an open wound at  present therefore she can be discharged. HILDER, MATTIELLO ([id number]) Electronic Signature(s) Signed: 05/01/2016 5:37:12 PM By: Linton Ham MD Entered By: Linton Ham on 05/01/2016 15:40:11 MERRILL, KRUPA ([id number]) -------------------------------------------------------------------------------- SuperBill Details Patient Name: [name] Date of Service: 05/01/2016 Medical Record Patient Account Number: [id number] Number: Treating RN: Montey Hora August 08, 1950 (66 y.o. 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.222 Non-pressure chronic ulcer of left calf with fat layer exposed G35 Multiple sclerosis E66.01 Morbid (severe) obesity due to excess calories F17.218 Nicotine dependence, cigarettes, with other nicotine-induced disorders L04.3 Acute lymphadenitis of lower limb Physician Procedures CPT4 Code: [id number] Description: 99213 - WC PHYS LEVEL 3 - EST PT ICD-10 Description Diagnosis L97.212 Non-pressure chronic ulcer of right calf with fat L97.222 Non-pressure chronic ulcer of left calf with fat l Modifier: layer exposed ayer exposed Quantity: 1 Electronic Signature(s) Signed: 05/01/2016 5:37:12 PM By: Linton Ham MD Entered By: Linton Ham on 05/01/2016 15:40:39

## 2016-05-02 NOTE — Progress Notes (Addendum)
BETZABET, IMBRIANO ([id number]) Visit Report for 05/01/2016 Arrival Information Details Patient Name: [name] Date of Service: 05/01/2016 2:00 PM Medical Record Patient Account Number: [id number] Number: Treating RN: Montey Hora [birth year] (66 y.o. Other Clinician: Date of Birth/Sex: Female) Treating ROBSON, [place] Primary Care Physician: LADA, MELINDA Physician/Extender: G Referring Physician: LADA, MELINDA Weeks in Treatment: 23 Visit Information History Since Last Visit Added or deleted any medications: No Patient Arrived: Wheel Chair Any new allergies or adverse reactions: No Arrival Time: 14:42 Had a fall or experienced change in No activities of daily living that may affect Accompanied By: spouse risk of falls: Transfer Assistance: None Signs or symptoms of abuse/neglect since last No Patient Identification Verified: Yes visito Secondary Verification Process Yes Hospitalized since last visit: No Completed: Pain Present Now: Yes Patient Requires Transmission-Based No Precautions: Patient Has Alerts: Yes Electronic Signature(s) Signed: 05/01/2016 6:00:22 PM By: Montey Hora Entered By: Montey Hora on 05/01/2016 14:43:13 [place], [name] ([id number]) -------------------------------------------------------------------------------- Clinic Level of Care Assessment Details Patient Name: [name] Date of Service: 05/01/2016 2:00 PM Medical Record Patient Account Number: [id number] Number: Treating RN: Montey Hora [birth year] (66 y.o. Other Clinician: Date of Birth/Sex: Female) Treating ROBSON, [place] Primary Care Physician: LADA, MELINDA Physician/Extender: G Referring Physician: LADA, MELINDA Weeks in Treatment: 23 Clinic Level of Care Assessment Items TOOL 4 Quantity Score []  - Use when only an EandM is performed on FOLLOW-UP visit 0 ASSESSMENTS - Nursing Assessment / Reassessment X - Reassessment of Co-morbidities (includes updates  in patient status) 1 10 X - Reassessment of Adherence to Treatment Plan 1 5 ASSESSMENTS - Wound and Skin Assessment / Reassessment []  - Simple Wound Assessment / Reassessment - one wound 0 X - Complex Wound Assessment / Reassessment - multiple wounds 2 5 []  - Dermatologic / Skin Assessment (not related to wound area) 0 ASSESSMENTS - Focused Assessment X - Circumferential Edema Measurements - multi extremities 1 5 []  - Nutritional Assessment / Counseling / Intervention 0 X - Lower Extremity Assessment (monofilament, tuning fork, pulses) 1 5 []  - Peripheral Arterial Disease Assessment (using hand held doppler) 0 ASSESSMENTS - Ostomy and/or Continence Assessment and Care []  - Incontinence Assessment and Management 0 []  - Ostomy Care Assessment and Management (repouching, etc.) 0 PROCESS - Coordination of Care X - Simple Patient / Family Education for ongoing care 1 15 []  - Complex (extensive) Patient / Family Education for ongoing care 0 []  - Staff obtains Programmer, systems, Records, Test Results / Process Orders 0 []  - Staff telephones HHA, Nursing Homes / Clarify orders / etc 0 [name], [name] ([id number]) []  - Routine Transfer to another Facility (non-emergent condition) 0 []  - Routine Hospital Admission (non-emergent condition) 0 []  - New Admissions / Biomedical engineer / Ordering NPWT, Apligraf, etc. 0 []  - Emergency Hospital Admission (emergent condition) 0 X - Simple Discharge Coordination 1 10 []  - Complex (extensive) Discharge Coordination 0 PROCESS - Special Needs []  - Pediatric / Minor Patient Management 0 []  - Isolation Patient Management 0 []  - Hearing / Language / Visual special needs 0 []  - Assessment of Community assistance (transportation, D/C planning, etc.) 0 []  - Additional assistance / Altered mentation 0 []  - Support Surface(s) Assessment (bed, cushion, seat, etc.) 0 INTERVENTIONS - Wound Cleansing / Measurement []  - Simple Wound Cleansing - one wound 0 X - Complex  Wound Cleansing - multiple wounds 2 5 X - Wound Imaging (photographs - any number of wounds) 1 5 []  - Wound Tracing (instead  of photographs) 0 []  - Simple Wound Measurement - one wound 0 X - Complex Wound Measurement - multiple wounds 2 5 INTERVENTIONS - Wound Dressings []  - Small Wound Dressing one or multiple wounds 0 []  - Medium Wound Dressing one or multiple wounds 0 []  - Large Wound Dressing one or multiple wounds 0 []  - Application of Medications - topical 0 []  - Application of Medications - injection 0 [name], [name] ([id number]) INTERVENTIONS - Miscellaneous []  - External ear exam 0 []  - Specimen Collection (cultures, biopsies, blood, body fluids, etc.) 0 []  - Specimen(s) / Culture(s) sent or taken to Lab for analysis 0 []  - Patient Transfer (multiple staff / Harrel Lemon Lift / Similar devices) 0 []  - Simple Staple / Suture removal (25 or less) 0 []  - Complex Staple / Suture removal (26 or more) 0 []  - Hypo / Hyperglycemic Management (close monitor of Blood Glucose) 0 []  - Ankle / Brachial Index (ABI) - do not check if billed separately 0 X - Vital Signs 1 5 Has the patient been seen at the hospital within the last three years: Yes Total Score: 90 Level Of Care: New/Established - Level 3 Electronic Signature(s) Signed: 05/02/2016 3:52:15 PM By: Montey Hora Entered By: Montey Hora on 05/02/2016 10:07:07 [name] -------------------------------------------------------------------------------- Encounter Discharge Information Details Patient Name: [name] Date of Service: 05/01/2016 2:00 PM Medical Record Patient Account Number: [id number] Number: Treating RN: Montey Hora 10/16/1950 (66 y.o. Other Clinician: Date of Birth/Sex: Female) Treating ROBSON, [place] Primary Care Physician: LADA, MELINDA Physician/Extender: G Referring Physician: LADA, MELINDA Weeks in Treatment: 38 Encounter Discharge Information Items Discharge Pain Level:  0 Discharge Condition: Stable Ambulatory Status: Wheelchair Discharge Destination: Home Transportation: Private Auto Accompanied By: spouse Schedule Follow-up Appointment: No Medication Reconciliation completed No and provided to Patient/Care Kelseigh Diver: Provided on Clinical Summary of Care: 05/01/2016 Form Type Recipient Paper Patient BW Electronic Signature(s) Signed: 05/02/2016 10:07:55 AM By: Montey Hora Previous Signature: 05/01/2016 3:24:59 PM Version By: Ruthine Dose Entered By: Montey Hora on 05/02/2016 10:07:54 [name] -------------------------------------------------------------------------------- Lower Extremity Assessment Details Patient Name: [name] Date of Service: 05/01/2016 2:00 PM Medical Record Patient Account Number: [id number] Number: Treating RN: Montey Hora [birth year] (66 y.o. Other Clinician: Date of Birth/Sex: Female) Treating ROBSON, [place] Primary Care Physician: LADA, MELINDA Physician/Extender: G Referring Physician: LADA, MELINDA Weeks in Treatment: 23 Edema Assessment Assessed: [Left: No] [Right: No] Edema: [Left: No] [Right: No] Calf Left: Right: Point of Measurement: 33 cm From Medial Instep 37.5 cm 36.5 cm Ankle Left: Right: Point of Measurement: 11 cm From Medial Instep 20.5 cm 20.6 cm Vascular Assessment Pulses: Dorsalis Pedis Palpable: [Left:Yes] [Right:Yes] Posterior Tibial Extremity colors, hair growth, and conditions: Extremity Color: [Left:Hyperpigmented] [Right:Hyperpigmented] Hair Growth on Extremity: [Left:No] [Right:No] Temperature of Extremity: [Left:Warm] [Right:Warm] Capillary Refill: [Left:< 3 seconds] [Right:< 3 seconds] Electronic Signature(s) Signed: 05/01/2016 6:00:22 PM By: Montey Hora Entered By: Montey Hora on 05/01/2016 14:57:25 Wojnarowski, [name] ([id number]) -------------------------------------------------------------------------------- Multi Wound Chart  Details Patient Name: [name] Date of Service: 05/01/2016 2:00 PM Medical Record Patient Account Number: [id number] Number: Treating RN: Montey Hora 1951-02-28 (66 y.o. Other Clinician: Date of Birth/Sex: Female) Treating ROBSON, MICHAEL Primary Care Physician: LADA, MELINDA Physician/Extender: G Referring Physician: LADA, MELINDA Weeks in Treatment: 23 Vital Signs Height(in): 63 Pulse(bpm): 74 Weight(lbs): 270 Blood Pressure 148/81 (mmHg): Body Mass Index(BMI): 48 Temperature(F): 98.1 Respiratory Rate 20 (breaths/min): Photos: [N/A:N/A] Wound Location: Left, Distal, Lateral Lower Right, Lateral Lower  Leg N/A Leg Wounding Event: Gradually Appeared Gradually Appeared N/A Primary Etiology: Venous Leg Ulcer Venous Leg Ulcer N/A Date Acquired: 12/21/2015 04/12/2016 N/A Weeks of Treatment: 18 2 N/A Wound Status: Open Open N/A Measurements L x W x D 0x0x0 0x0x0 N/A (cm) Area (cm) : 0 0 N/A Volume (cm) : 0 0 N/A % Reduction in Area: 100.00% 100.00% N/A % Reduction in Volume: 100.00% 100.00% N/A Classification: Partial Thickness Full Thickness Without N/A Exposed Support Structures Periwound Skin Texture: No Abnormalities Noted No Abnormalities Noted N/A Periwound Skin No Abnormalities Noted No Abnormalities Noted N/A Moisture: Periwound Skin Color: No Abnormalities Noted No Abnormalities Noted N/A No No N/A Castoro, Christal H. ([id number]) Tenderness on Palpation: Treatment Notes Electronic Signature(s) Signed: 05/01/2016 5:37:12 PM By: Linton Ham MD Entered By: Linton Ham on 05/01/2016 15:31:14 [name] -------------------------------------------------------------------------------- [place] Details Patient Name: [name] Date of Service: 05/01/2016 2:00 PM Medical Record Patient Account Number: [id number] Number: Treating RN: Montey Hora 10/31/1950 (65 y.o. Other Clinician: Date of  Birth/Sex: Female) Treating ROBSON, [place] Primary Care Physician: LADA, MELINDA Physician/Extender: G Referring Physician: LADA, MELINDA Weeks in Treatment: 15 Active Inactive Electronic Signature(s) Signed: 05/01/2016 6:00:22 PM By: Montey Hora Entered By: Montey Hora on 05/01/2016 15:10:34 [name] -------------------------------------------------------------------------------- Pain Assessment Details Patient Name: [name] Date of Service: 05/01/2016 2:00 PM Medical Record Patient Account Number: [id number] Number: Treating RN: Montey Hora 04-14-1951 (66 y.o. Other Clinician: Date of Birth/Sex: Female) Treating ROBSON, MICHAEL Primary Care Physician: LADA, MELINDA Physician/Extender: G Referring Physician: LADA, MELINDA Weeks in Treatment: 23 Active Problems Location of Pain Severity and Description of Pain Patient Has Paino Yes Site Locations Pain Location: Generalized Pain, Pain in Ulcers With Dressing Change: Yes Duration of the Pain. Constant / Intermittento Constant Pain Management and Medication Current Pain Management: Notes Topical or injectable lidocaine is offered to patient for acute pain when surgical debridement is performed. If needed, Patient is instructed to use over the counter pain medication for the following 24-48 hours after debridement. Wound care MDs do not prescribed pain medications. Patient has chronic pain or uncontrolled pain. Patient has been instructed to make an appointment with their Primary Care Physician for pain management. Electronic Signature(s) Signed: 05/01/2016 6:00:22 PM By: Montey Hora Entered By: Montey Hora on 05/01/2016 14:43:56 [name] -------------------------------------------------------------------------------- Patient/Caregiver Education Details Patient Name: [name] Date of Service: 05/01/2016 2:00 PM Medical Record Patient Account Number:  [id number] Number: Treating RN: Montey Hora February 01, 1951 (65 y.o. Other Clinician: Date of Birth/Gender: Female) Treating ROBSON, [place] Primary Care Physician: LADA, MELINDA Physician/Extender: G Referring Physician: LADA, MELINDA Weeks in Treatment: 39 Education Assessment Education Provided To: Patient and Caregiver Education Topics Provided Venous: Handouts: Other: must get compression garments Methods: Explain/Verbal Responses: State content correctly Electronic Signature(s) Signed: 05/02/2016 3:52:15 PM By: Montey Hora Entered By: Montey Hora on 05/02/2016 10:08:52 [name] -------------------------------------------------------------------------------- Wound Assessment Details Patient Name: [name] Date of Service: 05/01/2016 2:00 PM Medical Record Patient Account Number: [id number] Number: Treating RN: Montey Hora 1951/03/27 (66 y.o. Other Clinician: Date of Birth/Sex: Female) Treating ROBSON, MICHAEL Primary Care Physician: LADA, MELINDA Physician/Extender: G Referring Physician: LADA, MELINDA Weeks in Treatment: 23 Wound Status Wound Number: 5 Primary Etiology: Venous Leg Ulcer Wound Location: Left, Distal, Lateral Lower Leg Wound Status: Open Wounding Event: Gradually Appeared Date Acquired: 12/21/2015 Weeks Of Treatment: 18 Clustered Wound: No Photos Photo Uploaded By: Montey Hora on 05/01/2016  15:10:07 Wound Measurements Length: (cm) 0 Width: (cm) 0 Depth: (cm) 0 Area: (cm) 0 Volume: (cm) 0 % Reduction in Area: 100% % Reduction in Volume: 100% Wound Description Classification: Partial Thickness Periwound Skin Texture Texture Color No Abnormalities Noted: No No Abnormalities Noted: No Moisture No Abnormalities Noted: No Electronic Signature(s) KIBIBI, EISEN ([id number]) Signed: 05/01/2016 6:00:22 PM By: Montey Hora Entered By: Montey Hora on 05/01/2016 14:57:35 Resurreccion, [name]  ([id number]) -------------------------------------------------------------------------------- Wound Assessment Details Patient Name: [name] Date of Service: 05/01/2016 2:00 PM Medical Record Patient Account Number: [id number] Number: Treating RN: Montey Hora April 10, 1951 (65 y.o. Other Clinician: Date of Birth/Sex: Female) Treating ROBSON, [place] Primary Care Physician: LADA, MELINDA Physician/Extender: G Referring Physician: LADA, MELINDA Weeks in Treatment: 23 Wound Status Wound Number: 8 Primary Etiology: Venous Leg Ulcer Wound Location: Right, Lateral Lower Leg Wound Status: Open Wounding Event: Gradually Appeared Date Acquired: 04/12/2016 Weeks Of Treatment: 2 Clustered Wound: No Photos Photo Uploaded By: Montey Hora on 05/01/2016 15:10:08 Wound Measurements Length: (cm) 0 % Reduct Width: (cm) 0 % Reduct Depth: (cm) 0 Area: (cm) 0 Volume: (cm) 0 ion in Area: 100% ion in Volume: 100% Wound Description Full Thickness Without Exposed Classification: Support Structures Periwound Skin Texture Texture Color No Abnormalities Noted: No No Abnormalities Noted: No Moisture No Abnormalities Noted: No CLAIR, NICKLAS ([id number]) Electronic Signature(s) Signed: 05/01/2016 6:00:22 PM By: Montey Hora Entered By: Montey Hora on 05/01/2016 14:57:35 [name] -------------------------------------------------------------------------------- Vitals Details Patient Name: [name] Date of Service: 05/01/2016 2:00 PM Medical Record Patient Account Number: [id number] Number: Treating RN: Montey Hora 1950/12/02 (65 y.o. Other Clinician: Date of Birth/Sex: Female) Treating ROBSON, [place] Primary Care Physician: LADA, MELINDA Physician/Extender: G Referring Physician: LADA, MELINDA Weeks in Treatment: 23 Vital Signs Time Taken: 14:44 Temperature (F): 98.1 Height (in): 63 Pulse (bpm): 74 Weight (lbs):  270 Respiratory Rate (breaths/min): 20 Body Mass Index (BMI): 47.8 Blood Pressure (mmHg): 148/81 Reference Range: 80 - 120 mg / dl Electronic Signature(s) Signed: 05/01/2016 6:00:22 PM By: Montey Hora Entered By: Montey Hora on 05/01/2016 14:46:57

## 2016-05-03 DIAGNOSIS — L97219 Non-pressure chronic ulcer of right calf with unspecified severity: Secondary | ICD-10-CM | POA: Diagnosis not present

## 2016-05-03 DIAGNOSIS — I10 Essential (primary) hypertension: Secondary | ICD-10-CM | POA: Diagnosis not present

## 2016-05-03 DIAGNOSIS — L97419 Non-pressure chronic ulcer of right heel and midfoot with unspecified severity: Secondary | ICD-10-CM | POA: Diagnosis not present

## 2016-05-03 DIAGNOSIS — Z792 Long term (current) use of antibiotics: Secondary | ICD-10-CM | POA: Diagnosis not present

## 2016-05-03 DIAGNOSIS — J449 Chronic obstructive pulmonary disease, unspecified: Secondary | ICD-10-CM | POA: Diagnosis not present

## 2016-05-03 DIAGNOSIS — I70234 Atherosclerosis of native arteries of right leg with ulceration of heel and midfoot: Secondary | ICD-10-CM | POA: Diagnosis not present

## 2016-05-03 DIAGNOSIS — I70249 Atherosclerosis of native arteries of left leg with ulceration of unspecified site: Secondary | ICD-10-CM | POA: Diagnosis not present

## 2016-05-03 DIAGNOSIS — R339 Retention of urine, unspecified: Secondary | ICD-10-CM | POA: Diagnosis not present

## 2016-05-03 DIAGNOSIS — L03116 Cellulitis of left lower limb: Secondary | ICD-10-CM | POA: Diagnosis not present

## 2016-05-03 DIAGNOSIS — G35 Multiple sclerosis: Secondary | ICD-10-CM | POA: Diagnosis not present

## 2016-05-03 DIAGNOSIS — L97929 Non-pressure chronic ulcer of unspecified part of left lower leg with unspecified severity: Secondary | ICD-10-CM | POA: Diagnosis not present

## 2016-05-03 DIAGNOSIS — I70232 Atherosclerosis of native arteries of right leg with ulceration of calf: Secondary | ICD-10-CM | POA: Diagnosis not present

## 2016-05-06 ENCOUNTER — Telehealth: Payer: Self-pay | Admitting: Family Medicine

## 2016-05-06 DIAGNOSIS — J449 Chronic obstructive pulmonary disease, unspecified: Secondary | ICD-10-CM | POA: Diagnosis not present

## 2016-05-06 DIAGNOSIS — L97219 Non-pressure chronic ulcer of right calf with unspecified severity: Secondary | ICD-10-CM | POA: Diagnosis not present

## 2016-05-06 DIAGNOSIS — R339 Retention of urine, unspecified: Secondary | ICD-10-CM | POA: Diagnosis not present

## 2016-05-06 DIAGNOSIS — I70249 Atherosclerosis of native arteries of left leg with ulceration of unspecified site: Secondary | ICD-10-CM | POA: Diagnosis not present

## 2016-05-06 DIAGNOSIS — L97419 Non-pressure chronic ulcer of right heel and midfoot with unspecified severity: Secondary | ICD-10-CM | POA: Diagnosis not present

## 2016-05-06 DIAGNOSIS — I70234 Atherosclerosis of native arteries of right leg with ulceration of heel and midfoot: Secondary | ICD-10-CM | POA: Diagnosis not present

## 2016-05-06 DIAGNOSIS — L03116 Cellulitis of left lower limb: Secondary | ICD-10-CM | POA: Diagnosis not present

## 2016-05-06 DIAGNOSIS — I10 Essential (primary) hypertension: Secondary | ICD-10-CM | POA: Diagnosis not present

## 2016-05-06 DIAGNOSIS — G35 Multiple sclerosis: Secondary | ICD-10-CM | POA: Diagnosis not present

## 2016-05-06 DIAGNOSIS — I70232 Atherosclerosis of native arteries of right leg with ulceration of calf: Secondary | ICD-10-CM | POA: Diagnosis not present

## 2016-05-06 DIAGNOSIS — L97929 Non-pressure chronic ulcer of unspecified part of left lower leg with unspecified severity: Secondary | ICD-10-CM | POA: Diagnosis not present

## 2016-05-06 DIAGNOSIS — Z792 Long term (current) use of antibiotics: Secondary | ICD-10-CM | POA: Diagnosis not present

## 2016-05-06 NOTE — Telephone Encounter (Signed)
Verbal order given  

## 2016-05-06 NOTE — Telephone Encounter (Signed)
[name] from [facility] is asking for an order for social worker.

## 2016-05-07 DIAGNOSIS — G35 Multiple sclerosis: Secondary | ICD-10-CM | POA: Diagnosis not present

## 2016-05-07 DIAGNOSIS — R339 Retention of urine, unspecified: Secondary | ICD-10-CM | POA: Diagnosis not present

## 2016-05-07 DIAGNOSIS — L97419 Non-pressure chronic ulcer of right heel and midfoot with unspecified severity: Secondary | ICD-10-CM | POA: Diagnosis not present

## 2016-05-07 DIAGNOSIS — I70232 Atherosclerosis of native arteries of right leg with ulceration of calf: Secondary | ICD-10-CM | POA: Diagnosis not present

## 2016-05-07 DIAGNOSIS — L97219 Non-pressure chronic ulcer of right calf with unspecified severity: Secondary | ICD-10-CM | POA: Diagnosis not present

## 2016-05-07 DIAGNOSIS — J449 Chronic obstructive pulmonary disease, unspecified: Secondary | ICD-10-CM | POA: Diagnosis not present

## 2016-05-07 DIAGNOSIS — I70249 Atherosclerosis of native arteries of left leg with ulceration of unspecified site: Secondary | ICD-10-CM | POA: Diagnosis not present

## 2016-05-07 DIAGNOSIS — L97929 Non-pressure chronic ulcer of unspecified part of left lower leg with unspecified severity: Secondary | ICD-10-CM | POA: Diagnosis not present

## 2016-05-07 DIAGNOSIS — L03116 Cellulitis of left lower limb: Secondary | ICD-10-CM | POA: Diagnosis not present

## 2016-05-07 DIAGNOSIS — I10 Essential (primary) hypertension: Secondary | ICD-10-CM | POA: Diagnosis not present

## 2016-05-07 DIAGNOSIS — Z792 Long term (current) use of antibiotics: Secondary | ICD-10-CM | POA: Diagnosis not present

## 2016-05-07 DIAGNOSIS — I70234 Atherosclerosis of native arteries of right leg with ulceration of heel and midfoot: Secondary | ICD-10-CM | POA: Diagnosis not present

## 2016-05-09 ENCOUNTER — Other Ambulatory Visit: Payer: Self-pay | Admitting: Family Medicine

## 2016-05-10 ENCOUNTER — Other Ambulatory Visit: Payer: Self-pay

## 2016-05-10 DIAGNOSIS — G35 Multiple sclerosis: Secondary | ICD-10-CM | POA: Diagnosis not present

## 2016-05-10 DIAGNOSIS — I70249 Atherosclerosis of native arteries of left leg with ulceration of unspecified site: Secondary | ICD-10-CM | POA: Diagnosis not present

## 2016-05-10 DIAGNOSIS — I70232 Atherosclerosis of native arteries of right leg with ulceration of calf: Secondary | ICD-10-CM | POA: Diagnosis not present

## 2016-05-10 DIAGNOSIS — I70234 Atherosclerosis of native arteries of right leg with ulceration of heel and midfoot: Secondary | ICD-10-CM | POA: Diagnosis not present

## 2016-05-10 DIAGNOSIS — I10 Essential (primary) hypertension: Secondary | ICD-10-CM | POA: Diagnosis not present

## 2016-05-10 DIAGNOSIS — Z792 Long term (current) use of antibiotics: Secondary | ICD-10-CM | POA: Diagnosis not present

## 2016-05-10 DIAGNOSIS — L97929 Non-pressure chronic ulcer of unspecified part of left lower leg with unspecified severity: Secondary | ICD-10-CM | POA: Diagnosis not present

## 2016-05-10 DIAGNOSIS — L97419 Non-pressure chronic ulcer of right heel and midfoot with unspecified severity: Secondary | ICD-10-CM | POA: Diagnosis not present

## 2016-05-10 DIAGNOSIS — L03116 Cellulitis of left lower limb: Secondary | ICD-10-CM | POA: Diagnosis not present

## 2016-05-10 DIAGNOSIS — J449 Chronic obstructive pulmonary disease, unspecified: Secondary | ICD-10-CM | POA: Diagnosis not present

## 2016-05-10 DIAGNOSIS — R339 Retention of urine, unspecified: Secondary | ICD-10-CM | POA: Diagnosis not present

## 2016-05-10 DIAGNOSIS — L97219 Non-pressure chronic ulcer of right calf with unspecified severity: Secondary | ICD-10-CM | POA: Diagnosis not present

## 2016-05-10 MED ORDER — GABAPENTIN 600 MG PO TABS
600.0000 mg | ORAL_TABLET | Freq: Three times a day (TID) | ORAL | 0 refills | Status: DC
Start: 2016-05-10 — End: 2016-05-17

## 2016-05-10 NOTE — Telephone Encounter (Signed)
will be out tomorrow

## 2016-05-13 DIAGNOSIS — I70232 Atherosclerosis of native arteries of right leg with ulceration of calf: Secondary | ICD-10-CM | POA: Diagnosis not present

## 2016-05-13 DIAGNOSIS — G35 Multiple sclerosis: Secondary | ICD-10-CM | POA: Diagnosis not present

## 2016-05-13 DIAGNOSIS — R339 Retention of urine, unspecified: Secondary | ICD-10-CM | POA: Diagnosis not present

## 2016-05-13 DIAGNOSIS — I70249 Atherosclerosis of native arteries of left leg with ulceration of unspecified site: Secondary | ICD-10-CM | POA: Diagnosis not present

## 2016-05-13 DIAGNOSIS — L03116 Cellulitis of left lower limb: Secondary | ICD-10-CM | POA: Diagnosis not present

## 2016-05-13 DIAGNOSIS — L97419 Non-pressure chronic ulcer of right heel and midfoot with unspecified severity: Secondary | ICD-10-CM | POA: Diagnosis not present

## 2016-05-13 DIAGNOSIS — L97929 Non-pressure chronic ulcer of unspecified part of left lower leg with unspecified severity: Secondary | ICD-10-CM | POA: Diagnosis not present

## 2016-05-13 DIAGNOSIS — Z792 Long term (current) use of antibiotics: Secondary | ICD-10-CM | POA: Diagnosis not present

## 2016-05-13 DIAGNOSIS — I70234 Atherosclerosis of native arteries of right leg with ulceration of heel and midfoot: Secondary | ICD-10-CM | POA: Diagnosis not present

## 2016-05-13 DIAGNOSIS — I10 Essential (primary) hypertension: Secondary | ICD-10-CM | POA: Diagnosis not present

## 2016-05-13 DIAGNOSIS — J449 Chronic obstructive pulmonary disease, unspecified: Secondary | ICD-10-CM | POA: Diagnosis not present

## 2016-05-13 DIAGNOSIS — L97219 Non-pressure chronic ulcer of right calf with unspecified severity: Secondary | ICD-10-CM | POA: Diagnosis not present

## 2016-05-14 ENCOUNTER — Encounter: Payer: Medicare Other | Admitting: Internal Medicine

## 2016-05-14 ENCOUNTER — Other Ambulatory Visit: Payer: Self-pay | Admitting: Internal Medicine

## 2016-05-14 DIAGNOSIS — I70249 Atherosclerosis of native arteries of left leg with ulceration of unspecified site: Secondary | ICD-10-CM | POA: Diagnosis not present

## 2016-05-14 DIAGNOSIS — I825Y2 Chronic embolism and thrombosis of unspecified deep veins of left proximal lower extremity: Secondary | ICD-10-CM

## 2016-05-14 DIAGNOSIS — L97219 Non-pressure chronic ulcer of right calf with unspecified severity: Secondary | ICD-10-CM | POA: Diagnosis not present

## 2016-05-14 DIAGNOSIS — L97929 Non-pressure chronic ulcer of unspecified part of left lower leg with unspecified severity: Secondary | ICD-10-CM | POA: Diagnosis not present

## 2016-05-14 DIAGNOSIS — L043 Acute lymphadenitis of lower limb: Secondary | ICD-10-CM | POA: Diagnosis not present

## 2016-05-14 DIAGNOSIS — I70234 Atherosclerosis of native arteries of right leg with ulceration of heel and midfoot: Secondary | ICD-10-CM | POA: Diagnosis not present

## 2016-05-14 DIAGNOSIS — L97221 Non-pressure chronic ulcer of left calf limited to breakdown of skin: Secondary | ICD-10-CM | POA: Diagnosis not present

## 2016-05-14 DIAGNOSIS — J449 Chronic obstructive pulmonary disease, unspecified: Secondary | ICD-10-CM | POA: Diagnosis not present

## 2016-05-14 DIAGNOSIS — G35 Multiple sclerosis: Secondary | ICD-10-CM | POA: Diagnosis not present

## 2016-05-14 DIAGNOSIS — Z79899 Other long term (current) drug therapy: Secondary | ICD-10-CM | POA: Diagnosis not present

## 2016-05-14 DIAGNOSIS — G4733 Obstructive sleep apnea (adult) (pediatric): Secondary | ICD-10-CM | POA: Diagnosis not present

## 2016-05-14 DIAGNOSIS — L97512 Non-pressure chronic ulcer of other part of right foot with fat layer exposed: Secondary | ICD-10-CM | POA: Diagnosis not present

## 2016-05-14 DIAGNOSIS — L03116 Cellulitis of left lower limb: Secondary | ICD-10-CM | POA: Diagnosis not present

## 2016-05-14 DIAGNOSIS — L97222 Non-pressure chronic ulcer of left calf with fat layer exposed: Secondary | ICD-10-CM | POA: Diagnosis not present

## 2016-05-14 DIAGNOSIS — I10 Essential (primary) hypertension: Secondary | ICD-10-CM | POA: Diagnosis not present

## 2016-05-14 DIAGNOSIS — L97419 Non-pressure chronic ulcer of right heel and midfoot with unspecified severity: Secondary | ICD-10-CM | POA: Diagnosis not present

## 2016-05-14 DIAGNOSIS — I890 Lymphedema, not elsewhere classified: Secondary | ICD-10-CM | POA: Diagnosis not present

## 2016-05-14 DIAGNOSIS — Z792 Long term (current) use of antibiotics: Secondary | ICD-10-CM | POA: Diagnosis not present

## 2016-05-14 DIAGNOSIS — I739 Peripheral vascular disease, unspecified: Secondary | ICD-10-CM | POA: Diagnosis not present

## 2016-05-14 DIAGNOSIS — L97212 Non-pressure chronic ulcer of right calf with fat layer exposed: Secondary | ICD-10-CM | POA: Diagnosis not present

## 2016-05-14 DIAGNOSIS — R339 Retention of urine, unspecified: Secondary | ICD-10-CM | POA: Diagnosis not present

## 2016-05-14 DIAGNOSIS — I70232 Atherosclerosis of native arteries of right leg with ulceration of calf: Secondary | ICD-10-CM | POA: Diagnosis not present

## 2016-05-15 DIAGNOSIS — L03116 Cellulitis of left lower limb: Secondary | ICD-10-CM | POA: Diagnosis not present

## 2016-05-15 NOTE — Progress Notes (Signed)
[name], [name] ([id number]) Visit Report for 05/14/2016 Arrival Information Details Patient Name: [name] Date of Service: 05/14/2016 2:30 PM Medical Record Patient Account Number: [id number] Number: Treating RN: Baruch Gouty, RN, BSN, Rita 01-08-1951 [phone] y.o. Other Clinician: Date of Birth/Sex: Female) Treating ROBSON, [place] Primary Care Physician: LADA, MELINDA Physician/Extender: G Referring Physician: LADA, MELINDA Weeks in Treatment: 25 Visit Information History Since Last Visit All ordered tests and consults were completed: No Patient Arrived: Walker Added or deleted any medications: No Arrival Time: 14:31 Any new allergies or adverse reactions: No Accompanied By: self Had a fall or experienced change in No Transfer Assistance: None activities of daily living that may affect Patient Identification Verified: Yes risk of falls: Secondary Verification Process Completed: Yes Signs or symptoms of abuse/neglect since last No Patient Requires Transmission-Based No visito Precautions: Hospitalized since last visit: No Patient Has Alerts: Yes Has Dressing in Place as Prescribed: Yes Has Compression in Place as Prescribed: Yes Pain Present Now: No Electronic Signature(s) Signed: 05/14/2016 4:59:50 PM By: Regan Lemming BSN, RN Entered By: Regan Lemming on 05/14/2016 14:34:02 [name] ([id number]) -------------------------------------------------------------------------------- Encounter Discharge Information Details Patient Name: [name] Date of Service: 05/14/2016 2:30 PM Medical Record Patient Account Number: [id number] Number: Treating RN: Baruch Gouty, RN, BSN, Rita [birth year] (66 y.o. Other Clinician: Date of Birth/Sex: Female) Treating ROBSON, [place] Primary Care Physician: LADA, MELINDA Physician/Extender: G Referring Physician: LADA, MELINDA Weeks in Treatment: 25 Encounter Discharge Information Items Discharge Pain Level: 0 Discharge  Condition: Stable Ambulatory Status: Walker Discharge Destination: Home Transportation: Private Auto Accompanied By: self Schedule Follow-up Appointment: No Medication Reconciliation completed No and provided to Patient/Care Miguelangel Korn: Provided on Clinical Summary of Care: 05/14/2016 Form Type Recipient Paper Patient BW Electronic Signature(s) Signed: 05/14/2016 3:18:06 PM By: Ruthine Dose Entered By: Ruthine Dose on 05/14/2016 15:18:06 [name] ([id number]) -------------------------------------------------------------------------------- Lower Extremity Assessment Details Patient Name: [name] Date of Service: 05/14/2016 2:30 PM Medical Record Patient Account Number: [id number] Number: Treating RN: Baruch Gouty, RN, BSN, Rita Jun 10, 1950 (66 y.o. Other Clinician: Date of Birth/Sex: Female) Treating ROBSON, [place] Primary Care Physician: LADA, MELINDA Physician/Extender: G Referring Physician: LADA, MELINDA Weeks in Treatment: 25 Edema Assessment Assessed: [Left: No] [Right: No] E[Left: dema] [Right: :] Calf Left: Right: Point of Measurement: 33 cm From Medial Instep 37.5 cm cm Ankle Left: Right: Point of Measurement: 11 cm From Medial Instep 20.8 cm cm Vascular Assessment Claudication: Claudication Assessment [Left:None] Pulses: Dorsalis Pedis Palpable: [Left:Yes] Posterior Tibial Extremity colors, hair growth, and conditions: Extremity Color: [Left:Mottled] Hair Growth on Extremity: [Left:No] Temperature of Extremity: [Left:Warm] Capillary Refill: [Left:< 3 seconds] Toe Nail Assessment Left: Right: Thick: Yes Discolored: Yes Deformed: No Improper Length and Hygiene: No Electronic Signature(s) Signed: 05/14/2016 4:59:50 PM By: Regan Lemming BSN, RN Lawless, [name] ([id number]) Entered By: Regan Lemming on 05/14/2016 14:35:13 [name] ([id number]) -------------------------------------------------------------------------------- Multi Wound  Chart Details Patient Name: [name] Date of Service: 05/14/2016 2:30 PM Medical Record Patient Account Number: [id number] Number: Treating RN: Baruch Gouty, RN, BSN, Rita [birth year] (66 y.o. Other Clinician: Date of Birth/Sex: Female) Treating ROBSON, MICHAEL Primary Care Physician: LADA, MELINDA Physician/Extender: G Referring Physician: LADA, MELINDA Weeks in Treatment: 25 Vital Signs Height(in): 63 Pulse(bpm): 80 Weight(lbs): 270 Blood Pressure 155/61 (mmHg): Body Mass Index(BMI): 48 Temperature(F): 98.1 Respiratory Rate 20 (breaths/min): Photos: [9:No Photos] [N/A:N/A] Wound Location: [9:Left Lower Leg - Lateral N/A] Wounding Event: [9:Shear/Friction] [N/A:N/A] Primary Etiology: [9:Lymphedema] [N/A:N/A] Comorbid History: [9:Cataracts, Chronic Obstructive Pulmonary Disease (  COPD), Sleep Apnea, Hypertension, Osteoarthritis, Neuropathy] [N/A:N/A] Date Acquired: [9:05/09/2016] [N/A:N/A] Weeks of Treatment: [9:0] [N/A:N/A] Wound Status: [9:Open] [N/A:N/A] Measurements L x W x D 1x1x0.1 [N/A:N/A] (cm) Area (cm) : [9:0.785] [N/A:N/A] Volume (cm) : [9:0.079] [N/A:N/A] Classification: [9:Partial Thickness] [N/A:N/A] Exudate Amount: [9:Small] [N/A:N/A] Exudate Type: [9:Serosanguineous] [N/A:N/A] Exudate Color: [9:red, brown] [N/A:N/A] Wound Margin: [9:Distinct, outline attached N/A] Granulation Amount: [9:Small (1-33%)] [N/A:N/A] Granulation Quality: [9:Pale] [N/A:N/A] Necrotic Amount: [9:Large (67-100%)] [N/A:N/A] Exposed Structures: [9:Fascia: No Fat: No Tendon: No Muscle: No] [N/A:N/A] Joint: No Bone: No Limited to Skin Breakdown Epithelialization: None N/A N/A Periwound Skin Texture: Edema: Yes N/A N/A Excoriation: No Induration: No Callus: No Crepitus: No Fluctuance: No Friable: No Rash: No Scarring: No Periwound Skin Moist: Yes N/A N/A Moisture: Maceration: No Dry/Scaly: No Periwound Skin Color: Mottled: Yes N/A N/A Atrophie Blanche:  No Cyanosis: No Ecchymosis: No Erythema: No Hemosiderin Staining: No Pallor: No Rubor: No Temperature: No Abnormality N/A N/A Tenderness on Yes N/A N/A Palpation: Wound Preparation: Ulcer Cleansing: Other: N/A N/A surg scrub and water Topical Anesthetic Applied: Other: lidocaine 4% Treatment Notes Wound #9 (Left, Lateral Lower Leg) 1. Cleansed with: Cleanse wound with antibacterial soap and water 4. Dressing Applied: Aquacel 5. Secondary Dressing Applied ABD Pad 7. Secured with 4-Layer Compression System - Left Lower Extremity Electronic Signature(s) Signed: 05/15/2016 7:43:54 AM By: Linton Ham MD [name] Entered By: Linton Ham on 05/14/2016 15:13:11 [name] -------------------------------------------------------------------------------- [place] Details Patient Name: [name] Date of Service: 05/14/2016 2:30 PM Medical Record Patient Account Number: [id number] Number: Treating RN: Baruch Gouty, RN, BSN, Rita 05-Apr-1951 (66 y.o. Other Clinician: Date of Birth/Sex: Female) Treating ROBSON, [place] Primary Care Physician: LADA, MELINDA Physician/Extender: G Referring Physician: LADA, MELINDA Weeks in Treatment: 29 Active Inactive Abuse / Safety / Falls / Self Care Management Nursing Diagnoses: Impaired physical mobility Potential for falls Goals: Patient will remain injury free Date Initiated: 11/20/2015 Goal Status: Active Interventions: Assess fall risk on admission and as needed Notes: Nutrition Nursing Diagnoses: Imbalanced nutrition Goals: Patient/caregiver agrees to and verbalizes understanding of need to use nutritional supplements and/or vitamins as prescribed Date Initiated: 11/20/2015 Goal Status: Active Interventions: Provide education on nutrition Treatment Activities: Education provided on Nutrition : 03/19/2016 Notes: Orientation to the [place] JOLEAH, YEN ([id number]) Nursing Diagnoses: Knowledge deficit related to the wound healing center program Goals: Patient/caregiver will verbalize understanding of the [place] [facility] Program Date Initiated: 11/20/2015 Goal Status: Active Interventions: Provide education on orientation to the wound center Notes: Wound/Skin Impairment Nursing Diagnoses: Impaired tissue integrity Goals: Patient will demonstrate a reduced rate of smoking or cessation of smoking Date Initiated: 11/20/2015 Goal Status: Active Ulcer/skin breakdown will heal within 14 weeks Date Initiated: 11/20/2015 Goal Status: Active Interventions: Assess patient/caregiver ability to obtain necessary supplies Provide education on smoking Notes: Electronic Signature(s) Signed: 05/14/2016 4:59:50 PM By: Regan Lemming BSN, RN Entered By: Regan Lemming on 05/14/2016 14:57:26 [name] -------------------------------------------------------------------------------- Pain Assessment Details Patient Name: [name] Date of Service: 05/14/2016 2:30 PM Medical Record Patient Account Number: [id number] Number: Treating RN: Baruch Gouty, RN, BSN, Rita Feb 14, 1951 (66 y.o. Other Clinician: Date of Birth/Sex: Female) Treating ROBSON, MICHAEL Primary Care Physician: LADA, MELINDA Physician/Extender: G Referring Physician: LADA, MELINDA Weeks in Treatment: 25 Active Problems Location of Pain Severity and Description of Pain Patient Has Paino No Site Locations With Dressing Change: No Pain Management and Medication Current Pain Management: Electronic Signature(s) Signed: 05/14/2016 4:59:50 PM  By: Regan Lemming BSN, RN Entered By: Regan Lemming on 05/14/2016 14:34:11 [name] ([id number]) -------------------------------------------------------------------------------- Patient/Caregiver Education Details Patient Name: [name] Date of Service: 05/14/2016 2:30 PM Medical Record Patient Account  Number: [id number] Number: Treating RN: Baruch Gouty, RN, BSN, Rita [birth year] (66 y.o. Other Clinician: Date of Birth/Gender: Female) Treating ROBSON, [place] Primary Care Physician: LADA, MELINDA Physician/Extender: G Referring Physician: LADA, MELINDA Weeks in Treatment: 46 Education Assessment Education Provided To: Patient Education Topics Provided Nutrition: Methods: Explain/Verbal Responses: State content correctly Smoking and Wound Healing: Methods: Explain/Verbal Responses: State content correctly Welcome To [facility] [place]: Methods: Explain/Verbal Responses: State content correctly Wound/Skin Impairment: Methods: Explain/Verbal Responses: State content correctly Electronic Signature(s) Signed: 05/14/2016 4:59:50 PM By: Regan Lemming BSN, RN Entered By: Regan Lemming on 05/14/2016 15:10:05 [name] ([id number]) -------------------------------------------------------------------------------- Wound Assessment Details Patient Name: [name] Date of Service: 05/14/2016 2:30 PM Medical Record Patient Account Number: [id number] Number: Treating RN: Baruch Gouty, RN, BSN, Rita 01-Aug-1950 (66 y.o. Other Clinician: Date of Birth/Sex: Female) Treating ROBSON, MICHAEL Primary Care Physician: LADA, MELINDA Physician/Extender: G Referring Physician: LADA, MELINDA Weeks in Treatment: 25 Wound Status Wound Number: 9 Primary Lymphedema Etiology: Wound Location: Left Lower Leg - Lateral Wound Open Wounding Event: Shear/Friction Status: Date Acquired: 05/09/2016 Comorbid Cataracts, Chronic Obstructive Weeks Of Treatment: 0 History: Pulmonary Disease (COPD), Sleep Clustered Wound: No Apnea, Hypertension, Osteoarthritis, Neuropathy Photos Photo Uploaded By: Regan Lemming on 05/14/2016 17:13:32 Wound Measurements Length: (cm) 1 Width: (cm) 1 Depth: (cm) 0.1 Area: (cm) 0.785 Volume: (cm) 0.079 % Reduction in Area: % Reduction in  Volume: Epithelialization: None Tunneling: No Undermining: No Wound Description Classification: Partial Thickness Lupi, Marabeth H. ([id number]) Foul Odor After Cleansing: No Wound Margin: Distinct, outline attached Exudate Amount: Small Exudate Type: Serosanguineous Exudate Color: red, brown Wound Bed Granulation Amount: Small (1-33%) Exposed Structure Granulation Quality: Pale Fascia Exposed: No Necrotic Amount: Large (67-100%) Fat Layer Exposed: No Necrotic Quality: Adherent Slough Tendon Exposed: No Muscle Exposed: No Joint Exposed: No Bone Exposed: No Limited to Skin Breakdown Periwound Skin Texture Texture Color No Abnormalities Noted: No No Abnormalities Noted: No Callus: No Atrophie Blanche: No Crepitus: No Cyanosis: No Excoriation: No Ecchymosis: No Fluctuance: No Erythema: No Friable: No Hemosiderin Staining: No Induration: No Mottled: Yes Localized Edema: Yes Pallor: No Rash: No Rubor: No Scarring: No Temperature / Pain Moisture Temperature: No Abnormality No Abnormalities Noted: No Tenderness on Palpation: Yes Dry / Scaly: No Maceration: No Moist: Yes Wound Preparation Ulcer Cleansing: Other: surg scrub and water, Topical Anesthetic Applied: Other: lidocaine 4%, Treatment Notes Wound #9 (Left, Lateral Lower Leg) 1. Cleansed with: Cleanse wound with antibacterial soap and water 4. Dressing Applied: Aquacel 5. Secondary Dressing Applied ABD Pad 7. Secured with LEILANA, ROTERT ([id number]) 4-Layer Compression System - Left Lower Extremity Electronic Signature(s) Signed: 05/14/2016 4:59:50 PM By: Regan Lemming BSN, RN Entered By: Regan Lemming on 05/14/2016 14:43:11 [name] ([id number]) -------------------------------------------------------------------------------- Vitals Details Patient Name: [name] Date of Service: 05/14/2016 2:30 PM Medical Record Patient Account Number: [id number] Number: Treating RN: Baruch Gouty, RN,  BSN, Rita 18-Dec-1950 (66 y.o. Other Clinician: Date of Birth/Sex: Female) Treating ROBSON, MICHAEL Primary Care Physician: LADA, MELINDA Physician/Extender: G Referring Physician: LADA, MELINDA Weeks in Treatment: 25 Vital Signs Time Taken: 14:35 Temperature (F): 98.1 Height (in): 63 Pulse (bpm): 80 Weight (lbs): 270 Respiratory Rate (breaths/min): 20 Body Mass Index (BMI): 47.8 Blood Pressure (mmHg): 155/61 Reference Range: 80 - 120 mg / dl Electronic  Signature(s) Signed: 05/14/2016 4:59:50 PM By: Regan Lemming BSN, RN Entered By: Regan Lemming on 05/14/2016 14:38:08

## 2016-05-15 NOTE — Progress Notes (Signed)
[name], [name] ([id number]) Visit Report for 05/14/2016 Chief Complaint Document Details Patient Name: [name], [name] Date of Service: 05/14/2016 2:30 PM Medical Record Patient Account Number: [id number] Number: Treating RN: Baruch Gouty, RN, BSN, Rita 06-Apr-1951 [phone] y.o. Other Clinician: Date of Birth/Sex: Female) Treating ROBSON, MICHAEL Primary Care Physician/Extender: Peri Jefferson, [place] Physician: Referring Physician: LADA, MELINDA Weeks in Treatment: 25 Information Obtained from: Patient Chief Complaint returns for evaluation of bilateral lower extremity ulcerations Electronic Signature(s) Signed: 05/15/2016 7:43:54 AM By: [name] Entered By: [name] Ham on 05/14/2016 15:13:26 [name] ([id number]) -------------------------------------------------------------------------------- HPI Details Patient Name: [name] Date of Service: 05/14/2016 2:30 PM Medical Record Patient Account Number: [id number] Number: Treating RN: Baruch Gouty, RN, BSN, Rita 02-16-1951 (66 y.o. Other Clinician: Date of Birth/Sex: Female) Treating ROBSON, MICHAEL Primary Care Physician/Extender: Peri Jefferson, [place] Physician: Referring Physician: LADA, MELINDA Weeks in Treatment: 25 History of Present Illness Location: left and right lower extremity and dorsum of the right foot Quality: Patient reports experiencing a sharp pain to affected area(s). Severity: Patient rates the pain to be a 8 out of 10 especially with palpation Duration: Patient has had the wound for > 6 months prior to seeking treatment at the wound center Timing: Pain in wound is Intermittent in severity but persistent Context: The wound appeared gradually over time Modifying Factors: Other treatment(s) tried include: symptomatic treatment as advised by her PCP Associated Signs and Symptoms: Patient reports having increase swelling iin her bilateral lower extremities HPI Description: 66 year old with a past  medical history significant for MS, urinary incontinence, and obesity. She has been seen in the wound clinic before for lower extremity ulcerations treated with compression therapy. she is also known to have hypertension, peripheral vascular disease, COPD, obstructive sleep apnea, lumbar radiculopathy, kyphoscoliosis, urinary issues and tobacco abuse. Smokes a packet of cigarettes a day was recently seen at the [facility] for swelling of her legs and feet with a ulceration on the dorsum of the right foot which has been there for about 6 months. she was recently in the ER about a month ago where she was seen for shortness of breath and swelling of the legs and a chest x-ray was within normal limits had an increase in her BNP and was given Lasix and put on doxycycline for a mild cellulitis and possible UTI. Wounds aresmaller today 11/13/15. Debrided and will continue Santyl. 12/21/2015 -- she was admitted to the hospital overnight on 12/18/2015 and diagnosed with urinary retention and cellulitis of the left lower leg. is past to take clindamycin and use Santyl for her wounds. 01/15/2016 -- come back to see [place] for almost a month and continues to be noncompliant with her dressings 01/30/16 patient presents today for a follow-up visit concerning her ongoing bilateral lower extremity wounds. We have not seen her for the past 2 weeks although we are supposed to be seen her for weekly visits. She currently has a Foley catheter. She tells me that the wounds are intensely painful especially with pressure and palpation at this point in time. Fortunately she is having no interval signs or symptoms of systemic infection but unfortunately the wounds have not improved dramatically since we last saw her. She does have home health coming to take care of her as well. She is currently not in any compression wraps. 02/06/16 ON evaluation today patient continues to experience discomfort regarding her  bilateral lower extremity ulcers. She has continued to tolerate the dressing changes at this point  in time and continues to have a Foley catheter as well. Fortunately she is back this week in the past she has been somewhat noncompliant with follow-ups I'm glad to see her today. She tells me that her pain level varies but can be as [name], [name] ([id number]) high as a 7 out of 10 with manipulation of the wound. She tells me that she used to be on oxycodone which was managed by the pain clinic although she is no longer on that as she tells me that she was actually smoking marijuana at the time and when this was found out they discontinued her pain medication. She no longer is taking anything pain medication wise and she also does not smoke cigarettes nor marijuana at this point. 02/12/16; this is a patient I don't believe I have previously seen. She has multiple sclerosis. She has 4 punched-out areas on the anterior lateral left leg 2 areas on the right these are all in the same condition. Reasonably small [dime size] wounds each was some depth. Surface of these wounds does not look particularly healthy as there is adherent slough. There is no evidence of surrounding infection or inflammation. ABIs in this clinic were 0.87 on the right and 0.81 on the left. She is listed as having PAD and is a smoker. Not a diabetic 02/20/16 patient I gave antibiotics to last week for erythema around both wound sites on the left lateral leg and right lateral leg. This appears to be a lot better. One of the areas on the left leg is healed however she still has 3 punched-out open areas on the left lateral calf and one on the right lateral calf and one on the dorsal foot. She is an ex-smoker quitting 1 month ago. ABIs in this clinic were 0.87 on the right 0.81 on the left 03/12/16; this is a patient I really don't have a good sense of. It would appear for the first 5 or 6 weeks of her stay in this clinic she was  cared for by Dr. Con Memos. She appeared on my schedule in mid October and I saw her twice. She has not been here however in 3 weeks. She has advanced Wound Care at home where she lives with her husband. She has multiple sclerosis. I saw her the first time she had 4 punched-out small wounds on the anterior lateral left leg it appears that she is now down to 2. She also had wounds on the lateral and medial aspect of her right leg which were also small and punched out. The only one that remains is on the right lateral. Because of the nature of her wound I went ahead and ordered formal arterial studies this showed an ABI of 1 on the right and one on the left. TBI of 1.4 on the right and 0.79 on the left. She had normal waveforms. She was in the emergency room on 02/28/16; there is a noted edema of her left leg after a fall. She had a duplex ultrasound that showed no evidence for an acute DVT from the left groin to the popliteal fossa. The study was limited in the calf veins due to edema. Also noticeable that she had a left inguinal enlarged lymph node up to 6.2 cm. She also had a left knee x-ray that showed no acute findings and a right foot x-ray that showed soft tissue swelling but no radiographic evidence of osteomyelitis. The patient does stop smoking 03-19-16 Ms Straughter presents today for evaluation  of her bilateral lower extremity ulcerations. She states that she has not smoked in several weeks. She denies any pain or discomfort to bilateral lower extremities, tolerating compression therapy as ordered. 03/26/16; I have not seen this patient in 2 weeks however she has done very well with improvements in the areas in question. After we obtain normal arterial studies, increasing the 4-layer compression really seems to look done a nice job here. She has one open area on the left lateral leg and one on the medial right leg. These have filled in nicely and are now superficial wounds that showed  epithelialized. I note the left inguinal lymph node at 6.2 cm. I may need to refer this back to the patient's primary doctor. 04/03/16; patient has 2 remaining wounds 1 on the left lateral leg and one on the right medial leg. Both of filled in nicely since we are able to increase her from a 3 layer to a 4 layer compression. I am also following up on the lymph node notable on her left leg DVT rule out in November 04/10/16; area on the right lateral leg is healed. The area on the left leg is still open but looks considerably better with healthy granulation and less wound area. I12/20/17; last week we healed the patient doubt with regards to the wound on the right leg to her on compression stocking 20-30 mmHg as it turns out I don't think she had a stocking, predictably therefore she has reopened. The left leg as well as the wound on the lateral aspect. Been generally small line 04/24/16; we healed out her right leg 2 weeks ago to her own 20-30 mm compression stockings although as it turns out she didn't have these and did not purchase some apparently because of financial issues. The left leg wound is on the lateral aspect. Both of these wounds are just about healed. 05/01/16; her wounds on the left leg are healed out today. The area on the right leg is also healed. Vitamin Gadsden, Mattilyn H. ([id number]) diligent effort of our staff we have not been able to get any form of compression stocking to this patient. The orders for juxtalite's were given to home health this never materialized. We ordered compression stockings I don't think she was able to afford these. We had a discussion today with both the patient and her husband without these, will accumulate edema and the wounds will simply reopen again. 05/14/16 READMISSION this is a patient we healed out 2 weeks ago. As bilateral lower extremity venous wounds probably some degree of lymphedema. At the beginning in November I did a duplex ultrasound of her  left leg that was negative for DVT but did show a lymph node in the left inguinal area presumably reactive. We've been using compression to her lower extremities eventually healed all her wounds on her bilateral calves but we were not able to get any form of compression stockings for her in spite of intense efforts of our staff. She returns today with reopening on the left leg did not the right she has several superficial areas anteriorly but an actual frank ulcer on the lateral left calf. This is about the size of a dime or smaller. She does not really complain of pain. The patient has a history of PAD however arterial studies we did in November were normal. Her ABIs and Doppler waveforms were both within the normal limits. The patient has a history of MS. In discussing things with her today it would  appear that the opening was noted 2 weeks ago according to the patient. We gave her Tubigrip stockings when she left the clinic Electronic Signature(s) Signed: 05/15/2016 7:43:54 AM By: [name] Entered By: [name] Ham on 05/14/2016 15:18:03 [name] -------------------------------------------------------------------------------- Physical Exam Details Patient Name: [name] Date of Service: 05/14/2016 2:30 PM Medical Record Patient Account Number: [id number] Number: Treating RN: Baruch Gouty, RN, BSN, Rita July 16, 1950 (66 y.o. Other Clinician: Date of Birth/Sex: Female) Treating ROBSON, MICHAEL Primary Care Physician/Extender: Peri Jefferson, [place] Physician: Referring Physician: LADA, MELINDA Weeks in Treatment: 25 Constitutional Sitting or standing Blood Pressure is within target range for patient.. Pulse regular and within target range for patient.Marland Kitchen Respirations regular, non-labored and within target range.. Temperature is normal and within the target range for the patient.. Patient's appearance is neat and clean. Appears in no acute distress.  Well nourished and well developed.. Eyes Conjunctivae clear. No discharge.. Neck Neck supple and symmetrical. No masses or crepitus. Respiratory Respiratory effort is easy and symmetric bilaterally. Rate is normal at rest and on room air.. Bilateral breath sounds are clear and equal in all lobes with no wheezes, rales or rhonchi.. Cardiovascular Heart rhythm and rate regular, without murmur or gallop.. Pedal pulses are palpable. Edema present in both extremities. The left is much larger than the right especially her left thigh although she is not complaining of pain. Gastrointestinal (GI) Obese. I could not feel a liver edge I thought I might be feeling a spleen tip but I was uncertain. Lymphatic Nonpalpable in the popliteal or inguinal area. No lymphadenopathy or glandular swelling noted in axillae.. No lymphadenopathy or glandular swelling noted in groin.Marland Kitchen Psychiatric No evidence of depression, anxiety, or agitation. Calm, cooperative, and communicative. Appropriate interactions and affect.. Notes Wound exam; the area on the right leg is still closed and there is not a lot of edema in the right calf. Performing the left calf and the left thigh there is swelling right up into the groin area. There is no palpable warmth or tenderness. She has a small wound on the left lateral calf which has a reasonably healthy base. There is no evidence of infection. Electronic Signature(s) Signed: 05/15/2016 7:43:54 AM By: [name] Entered By: [name] Ham on 05/14/2016 15:20:52 Delao, [name] ([id number]) NORIELLE, PURTILL ([id number]) -------------------------------------------------------------------------------- Physician Orders Details Patient Name: [name] Date of Service: 05/14/2016 2:30 PM Medical Record Patient Account Number: [id number] Number: Treating RN: Baruch Gouty, RN, BSN, Rita 02-20-1951 (66 y.o. Other Clinician: Date of Birth/Sex: Female) Treating ROBSON,  MICHAEL Primary Care Physician/Extender: Peri Jefferson, [place] Physician: Referring Physician: LADA, MELINDA Weeks in Treatment: 74 Verbal / Phone Orders: Yes Clinician: Afful, RN, BSN, Rita Read Back and Verified: Yes Diagnosis Coding Wound Cleansing Wound #9 Left,Lateral Lower Leg o Cleanse wound with mild soap and water Skin Barriers/Peri-Wound Care Wound #9 Left,Lateral Lower Leg o Barrier cream o Moisturizing lotion Primary Wound Dressing Wound #9 Left,Lateral Lower Leg o Aquacel Secondary Dressing Wound #9 Left,Lateral Lower Leg o ABD pad Dressing Change Frequency Wound #9 Left,Lateral Lower Leg o Change dressing every week Follow-up Appointments Wound #9 Left,Lateral Lower Leg o Return Appointment in 1 week. Edema Control Wound #9 Left,Lateral Lower Leg o 4-Layer Compression System - Left Lower Extremity Additional Orders / Instructions Wound #9 Left,Lateral Lower Leg o Increase protein intake. ZAIRA, LIGHT ([id number]) o Activity as tolerated Home Health Wound #9 [place] Nurse [phone]  visit PRN to address patientos wound care needs. o FACE TO FACE ENCOUNTER: MEDICARE and MEDICAID PATIENTS: I certify that this patient is under my care and that I had a face-to-face encounter that meets the physician face-to-face encounter requirements with this patient on this date. The encounter with the patient was in whole or in part for the following MEDICAL CONDITION: (primary reason for [place]) MEDICAL NECESSITY: I certify, that based on my findings, NURSING services are a medically necessary home health service. HOME BOUND STATUS: I certify that my clinical findings support that this patient is homebound (i.e., Due to illness or injury, pt requires aid of supportive devices such as crutches, cane, wheelchairs, walkers, the use of special transportation or the assistance of another person  to leave their place of residence. There is a normal inability to leave the home and doing so requires considerable and taxing effort. Other absences are for medical reasons / religious services and are infrequent or of short duration when for other reasons). o If current dressing causes regression in wound condition, may D/C ordered dressing product/s and apply Normal Saline Moist Dressing daily until next [facility] / Other [name] appointment. [place] of regression in wound condition at [phone]. o Please direct any NON-WOUND related issues/requests for orders to patient's Primary Care Physician Consults o Vascular - ultrasound of left thigh..rule out DVT Electronic Signature(s) Signed: 05/14/2016 4:59:50 PM By: Regan Lemming BSN, RN Signed: 05/15/2016 7:43:54 AM By: [name] Entered By: Regan Lemming on 05/14/2016 15:00:00 [name] -------------------------------------------------------------------------------- Problem List Details Patient Name: [name] Date of Service: 05/14/2016 2:30 PM Medical Record Patient Account Number: [id number] Number: Treating RN: Baruch Gouty, RN, BSN, Rita [birth year] (66 y.o. 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 11/20/2015 Yes exposed L97.512 Non-pressure chronic ulcer of other part of right foot with 11/20/2015 Yes fat layer exposed G35 Multiple sclerosis 11/20/2015 Yes E66.01 Morbid (severe) obesity due to excess calories 11/20/2015 Yes F17.218 Nicotine dependence, cigarettes, with other nicotine- 11/20/2015 Yes induced disorders L04.3 Acute lymphadenitis of lower limb 04/03/2016 Yes Inactive  Problems ICD-10 Code Description Active Date Inactive Date L97.222 Non-pressure chronic ulcer of left calf with fat layer 11/20/2015 11/20/2015 exposed Resolved Problems WYNDY, BEILMAN ([id number]) Electronic Signature(s) Signed: 05/15/2016 7:43:54 AM By: [name] Entered By: [name] Ham on 05/14/2016 15:13:03 [name] -------------------------------------------------------------------------------- Progress Note Details Patient Name: [name] Date of Service: 05/14/2016 2:30 PM Medical Record Patient Account Number: [id number] Number: Treating RN: Baruch Gouty, RN, BSN, Rita 08/14/1950 (66 y.o. Other Clinician: Date of Birth/Sex: Female) Treating ROBSON, MICHAEL Primary Care Physician/Extender: Peri Jefferson, [place] Physician: Referring Physician: LADA, MELINDA Weeks in Treatment: 25 Subjective Chief Complaint Information obtained from Patient returns for evaluation of bilateral lower extremity ulcerations History of Present Illness (HPI) The following HPI elements were documented for the patient's wound: Location: left and right lower extremity and dorsum of the right foot Quality: Patient reports experiencing a sharp pain to affected area(s). Severity: Patient rates the pain to be a 8 out of 10 especially with palpation Duration: Patient has had the wound for > 6 months prior to seeking treatment at the wound center Timing: Pain in wound is Intermittent in severity but persistent Context: The wound  appeared gradually over time Modifying Factors: Other treatment(s) tried include: symptomatic treatment as advised by her PCP Associated Signs and Symptoms: Patient reports having increase swelling iin her bilateral lower extremities 66 year old with a past medical history significant for MS, urinary incontinence, and obesity. She has been seen in the wound clinic before for lower extremity ulcerations treated with compression therapy. she is  also known to have hypertension, peripheral vascular disease, COPD, obstructive sleep apnea, lumbar radiculopathy, kyphoscoliosis, urinary issues and tobacco abuse. Smokes a packet of cigarettes a day was recently seen at the [facility] for swelling of her legs and feet with a ulceration on the dorsum of the right foot which has been there for about 6 months. she was recently in the ER about a month ago where she was seen for shortness of breath and swelling of the legs and a chest x-ray was within normal limits had an increase in her BNP and was given Lasix and put on doxycycline for a mild cellulitis and possible UTI. Wounds aresmaller today 11/13/15. Debrided and will continue Santyl. 12/21/2015 -- she was admitted to the hospital overnight on 12/18/2015 and diagnosed with urinary retention and cellulitis of the left lower leg. is past to take clindamycin and use Santyl for her wounds. 01/15/2016 -- come back to see [place] for almost a month and continues to be noncompliant with her dressings 01/30/16 patient presents today for a follow-up visit concerning her ongoing bilateral lower extremity wounds. We have not seen her for the past 2 weeks although we are supposed to be seen her for weekly visits. She currently has a Foley catheter. She tells me that the wounds are intensely painful especially with pressure and palpation at this point in time. Fortunately she is having no interval signs or symptoms of systemic infection but unfortunately the wounds have not improved dramatically since we last saw her. She does Faxon, Xariah H. ([id number]) have home health coming to take care of her as well. She is currently not in any compression wraps. 02/06/16 ON evaluation today patient continues to experience discomfort regarding her bilateral lower extremity ulcers. She has continued to tolerate the dressing changes at this point in time and continues to have a Foley catheter as well.  Fortunately she is back this week in the past she has been somewhat noncompliant with follow-ups I'm glad to see her today. She tells me that her pain level varies but can be as high as a 7 out of 10 with manipulation of the wound. She tells me that she used to be on oxycodone which was managed by the pain clinic although she is no longer on that as she tells me that she was actually smoking marijuana at the time and when this was found out they discontinued her pain medication. She no longer is taking anything pain medication wise and she also does not smoke cigarettes nor marijuana at this point. 02/12/16; this is a patient I don't believe I have previously seen. She has multiple sclerosis. She has 4 punched-out areas on the anterior lateral left leg 2 areas on the right these are all in the same condition. Reasonably small [dime size] wounds each was some depth. Surface of these wounds does not look particularly healthy as there is adherent slough. There is no evidence of surrounding infection or inflammation. ABIs in this clinic were 0.87 on the right and 0.81 on the left. She is listed as having PAD and is a smoker. Not a diabetic  02/20/16 patient I gave antibiotics to last week for erythema around both wound sites on the left lateral leg and right lateral leg. This appears to be a lot better. One of the areas on the left leg is healed however she still has 3 punched-out open areas on the left lateral calf and one on the right lateral calf and one on the dorsal foot. She is an ex-smoker quitting 1 month ago. ABIs in this clinic were 0.87 on the right 0.81 on the left 03/12/16; this is a patient I really don't have a good sense of. It would appear for the first 5 or 6 weeks of her stay in this clinic she was cared for by Dr. Con Memos. She appeared on my schedule in mid October and I saw her twice. She has not been here however in 3 weeks. She has advanced Wound Care at home where she lives  with her husband. She has multiple sclerosis. I saw her the first time she had 4 punched-out small wounds on the anterior lateral left leg it appears that she is now down to 2. She also had wounds on the lateral and medial aspect of her right leg which were also small and punched out. The only one that remains is on the right lateral. Because of the nature of her wound I went ahead and ordered formal arterial studies this showed an ABI of 1 on the right and one on the left. TBI of 1.4 on the right and 0.79 on the left. She had normal waveforms. She was in the emergency room on 02/28/16; there is a noted edema of her left leg after a fall. She had a duplex ultrasound that showed no evidence for an acute DVT from the left groin to the popliteal fossa. The study was limited in the calf veins due to edema. Also noticeable that she had a left inguinal enlarged lymph node up to 6.2 cm. She also had a left knee x-ray that showed no acute findings and a right foot x-ray that showed soft tissue swelling but no radiographic evidence of osteomyelitis. The patient does stop smoking 03-19-16 Ms Besic presents today for evaluation of her bilateral lower extremity ulcerations. She states that she has not smoked in several weeks. She denies any pain or discomfort to bilateral lower extremities, tolerating compression therapy as ordered. 03/26/16; I have not seen this patient in 2 weeks however she has done very well with improvements in the areas in question. After we obtain normal arterial studies, increasing the 4-layer compression really seems to look done a nice job here. She has one open area on the left lateral leg and one on the medial right leg. These have filled in nicely and are now superficial wounds that showed epithelialized. I note the left inguinal lymph node at 6.2 cm. I may need to refer this back to the patient's primary doctor. 04/03/16; patient has 2 remaining wounds 1 on the left lateral leg  and one on the right medial leg. Both of filled in nicely since we are able to increase her from a 3 layer to a 4 layer compression. I am also following up on the lymph node notable on her left leg DVT rule out in November 04/10/16; area on the right lateral leg is healed. The area on the left leg is still open but looks considerably better with healthy granulation and less wound area. I12/20/17; last week we healed the patient doubt with regards to the wound on  the right leg to her on Benefiel, Jaislyn H. ([id number]) compression stocking 20-30 mmHg as it turns out I don't think she had a stocking, predictably therefore she has reopened. The left leg as well as the wound on the lateral aspect. Been generally small line 04/24/16; we healed out her right leg 2 weeks ago to her own 20-30 mm compression stockings although as it turns out she didn't have these and did not purchase some apparently because of financial issues. The left leg wound is on the lateral aspect. Both of these wounds are just about healed. 05/01/16; her wounds on the left leg are healed out today. The area on the right leg is also healed. Vitamin diligent effort of our staff we have not been able to get any form of compression stocking to this patient. The orders for juxtalite's were given to home health this never materialized. We ordered compression stockings I don't think she was able to afford these. We had a discussion today with both the patient and her husband without these, will accumulate edema and the wounds will simply reopen again. 05/14/16 READMISSION this is a patient we healed out 2 weeks ago. As bilateral lower extremity venous wounds probably some degree of lymphedema. At the beginning in November I did a duplex ultrasound of her left leg that was negative for DVT but did show a lymph node in the left inguinal area presumably reactive. We've been using compression to her lower extremities eventually healed all her  wounds on her bilateral calves but we were not able to get any form of compression stockings for her in spite of intense efforts of our staff. She returns today with reopening on the left leg did not the right she has several superficial areas anteriorly but an actual frank ulcer on the lateral left calf. This is about the size of a dime or smaller. She does not really complain of pain. The patient has a history of PAD however arterial studies we did in November were normal. Her ABIs and Doppler waveforms were both within the normal limits. The patient has a history of MS. In discussing things with her today it would appear that the opening was noted 2 weeks ago according to the patient. We gave her Tubigrip stockings when she left the clinic Objective Constitutional Sitting or standing Blood Pressure is within target range for patient.. Pulse regular and within target range for patient.Marland Kitchen Respirations regular, non-labored and within target range.. Temperature is normal and within the target range for the patient.. Patient's appearance is neat and clean. Appears in no acute distress. Well nourished and well developed.. Vitals Time Taken: 2:35 PM, Height: 63 in, Weight: 270 lbs, BMI: 47.8, Temperature: 98.1 F, Pulse: 80 bpm, Respiratory Rate: 20 breaths/min, Blood Pressure: 155/61 mmHg. Eyes Conjunctivae clear. No discharge.. Neck Neck supple and symmetrical. No masses or crepitus. Respiratory Respiratory effort is easy and symmetric bilaterally. Rate is normal at rest and on room air.. Bilateral breath Ode, Melenie H. ([id number]) sounds are clear and equal in all lobes with no wheezes, rales or rhonchi.. Cardiovascular Heart rhythm and rate regular, without murmur or gallop.. Pedal pulses are palpable. Edema present in both extremities. The left is much larger than the right especially her left thigh although she is not complaining of pain. Gastrointestinal (GI) Obese. I could not feel  a liver edge I thought I might be feeling a spleen tip but I was uncertain. Lymphatic Nonpalpable in the popliteal or inguinal area. No lymphadenopathy  or glandular swelling noted in axillae.. No lymphadenopathy or glandular swelling noted in groin.Marland Kitchen Psychiatric No evidence of depression, anxiety, or agitation. Calm, cooperative, and communicative. Appropriate interactions and affect.. General Notes: Wound exam; the area on the right leg is still closed and there is not a lot of edema in the right calf. Performing the left calf and the left thigh there is swelling right up into the groin area. There is no palpable warmth or tenderness. She has a small wound on the left lateral calf which has a reasonably healthy base. There is no evidence of infection. Integumentary (Hair, Skin) Wound #9 status is Open. Original cause of wound was Shear/Friction. The wound is located on the Left,Lateral Lower Leg. The wound measures 1cm length x 1cm width x 0.1cm depth; 0.785cm^2 area and 0.079cm^3 volume. The wound is limited to skin breakdown. There is no tunneling or undermining noted. There is a small amount of serosanguineous drainage noted. The wound margin is distinct with the outline attached to the wound base. There is small (1-33%) pale granulation within the wound bed. There is a large (67-100%) amount of necrotic tissue within the wound bed including Adherent Slough. The periwound skin appearance exhibited: Localized Edema, Moist, Mottled. The periwound skin appearance did not exhibit: Callus, Crepitus, Excoriation, Fluctuance, Friable, Induration, Rash, Scarring, Dry/Scaly, Maceration, Atrophie Blanche, Cyanosis, Ecchymosis, Hemosiderin Staining, Pallor, Rubor, Erythema. Periwound temperature was noted as No Abnormality. The periwound has tenderness on palpation. Assessment Active Problems ICD-10 L97.212 - Non-pressure chronic ulcer of right calf with fat layer exposed L97.512 - Non-pressure  chronic ulcer of other part of right foot with fat layer exposed G35 - Multiple sclerosis E66.01 - Morbid (severe) obesity due to excess calories F17.218 - Nicotine dependence, cigarettes, with other nicotine-induced disorders L04.3 - Acute lymphadenitis of lower limb Hardaway, Erva H. ([id number]) Plan Wound Cleansing: Wound #9 Left,Lateral Lower Leg: Cleanse wound with mild soap and water Skin Barriers/Peri-Wound Care: Wound #9 Left,Lateral Lower Leg: Barrier cream Moisturizing lotion Primary Wound Dressing: Wound #9 Left,Lateral Lower Leg: Aquacel Secondary Dressing: Wound #9 Left,Lateral Lower Leg: ABD pad Dressing Change Frequency: Wound #9 Left,Lateral Lower Leg: Change dressing every week Follow-up Appointments: Wound #9 Left,Lateral Lower Leg: Return Appointment in 1 week. Edema Control: Wound #9 Left,Lateral Lower Leg: 4-Layer Compression System - Left Lower Extremity Additional Orders / Instructions: Wound #9 Left,Lateral Lower Leg: Increase protein intake. Activity as tolerated Home Health: Wound #9 Left,Lateral Lower Leg: [place] Nurse may visit PRN to address patient s wound care needs. FACE TO FACE ENCOUNTER: MEDICARE and MEDICAID PATIENTS: I certify that this patient is under my care and that I had a face-to-face encounter that meets the physician face-to-face encounter requirements with this patient on this date. The encounter with the patient was in whole or in part for the following MEDICAL CONDITION: (primary reason for [place]) MEDICAL NECESSITY: I certify, that based on my findings, NURSING services are a medically necessary home health service. HOME BOUND STATUS: I certify that my clinical findings support that this patient is homebound (i.e., Due to illness or injury, pt requires aid of supportive devices such as crutches, cane, wheelchairs, walkers, the use of special transportation or the assistance of another  person to leave their place of residence. There is a normal inability to leave the home and doing so requires considerable and taxing effort. Other absences are for medical reasons / religious services and are infrequent or of short duration when for other  reasons). If current dressing causes regression in wound condition, may D/C ordered dressing product/s and apply Normal Saline Moist Dressing daily until next [place] / Other [name] appointment. Notify Wound Mcquillen, Joli H. ([id number]) [place] of regression in wound condition at [phone]. Please direct any NON-WOUND related issues/requests for orders to patient's Primary Care Physician Consults ordered were: Vascular - ultrasound of left thigh..rule out DVT #1 we applied Aquacel to the areas of the left lower leg/ABD/and a 4 layer wrap to the left leg. #2 there is very significant edema up into the left groin area. This is pitting not particularly warm, mildly tender. I'm going to send her for a duplex ultrasound of the left thigh rule out DVT I don't think this needs to be done urgently. I did the same study in early November that was negative. If the duplex ultrasound is negative the patient may need imaging studies of her abdomen and pelvis especially her pelvic veins to make sure there is not an issue such as a clot in her major central venous system. #3 general physical exam didn't really suggest an ominous finding. The patient looks pale and somewhat chronically unwell but not too much different than I'm used to seeing. #4 previous ultrasound that suggested an enlarged lymph node in the left groin area. She had been recently treated with cellulitis my thinking was that this was probably reactive. Hopefully the current ultrasound will also look at this as well Electronic Signature(s) Signed: 05/15/2016 7:43:54 AM By: [name] Entered By: [name] Ham on 05/14/2016 15:23:44 [name]  ([id number]) -------------------------------------------------------------------------------- SuperBill Details Patient Name: [name] Date of Service: 05/14/2016 Medical Record Patient Account Number: [id number] Number: Treating RN: Baruch Gouty, RN, BSN, Rita 1950/10/16 (66 y.o. Other Clinician: Date of Birth/Sex: Female) Treating ROBSON, MICHAEL Primary Care Physician/Extender: Peri [place], [place] Physician: Weeks in Treatment: 25 Referring Physician: LADA, Driscoll Diagnosis Coding ICD-10 Codes Code Description [phone] Non-pressure chronic ulcer of right calf with fat layer exposed L97.512 Non-pressure chronic ulcer of other part of right foot with fat layer exposed L97.222 Non-pressure chronic ulcer of left calf with fat layer exposed G35 Multiple sclerosis E66.01 Morbid (severe) obesity due to excess calories F17.218 Nicotine dependence, cigarettes, with other nicotine-induced disorders L04.3 Acute lymphadenitis of lower limb Facility Procedures CPT4: Description Modifier Quantity Code [id number] (Facility Use Only) [phone] - APPLY [place] LT 1 LEG Physician Procedures CPT4 Code: [id number] Description: [id number] - WC PHYS LEVEL 4 - EST PT ICD-10 Description Diagnosis L97.222 Non-pressure chronic ulcer of left calf with fat l L04.3 Acute lymphadenitis of lower limb Modifier: ayer exposed Quantity: 1 Electronic Signature(s) Signed: 05/15/2016 7:43:54 AM By: [name] Entered By: [name] Ham on 05/14/2016 15:24:17

## 2016-05-16 DIAGNOSIS — L97419 Non-pressure chronic ulcer of right heel and midfoot with unspecified severity: Secondary | ICD-10-CM | POA: Diagnosis not present

## 2016-05-16 DIAGNOSIS — I10 Essential (primary) hypertension: Secondary | ICD-10-CM | POA: Diagnosis not present

## 2016-05-16 DIAGNOSIS — I70232 Atherosclerosis of native arteries of right leg with ulceration of calf: Secondary | ICD-10-CM | POA: Diagnosis not present

## 2016-05-16 DIAGNOSIS — L03116 Cellulitis of left lower limb: Secondary | ICD-10-CM | POA: Diagnosis not present

## 2016-05-16 DIAGNOSIS — L97219 Non-pressure chronic ulcer of right calf with unspecified severity: Secondary | ICD-10-CM | POA: Diagnosis not present

## 2016-05-16 DIAGNOSIS — I70234 Atherosclerosis of native arteries of right leg with ulceration of heel and midfoot: Secondary | ICD-10-CM | POA: Diagnosis not present

## 2016-05-16 DIAGNOSIS — G35 Multiple sclerosis: Secondary | ICD-10-CM | POA: Diagnosis not present

## 2016-05-16 DIAGNOSIS — L97929 Non-pressure chronic ulcer of unspecified part of left lower leg with unspecified severity: Secondary | ICD-10-CM | POA: Diagnosis not present

## 2016-05-16 DIAGNOSIS — Z792 Long term (current) use of antibiotics: Secondary | ICD-10-CM | POA: Diagnosis not present

## 2016-05-16 DIAGNOSIS — J449 Chronic obstructive pulmonary disease, unspecified: Secondary | ICD-10-CM | POA: Diagnosis not present

## 2016-05-16 DIAGNOSIS — R339 Retention of urine, unspecified: Secondary | ICD-10-CM | POA: Diagnosis not present

## 2016-05-16 DIAGNOSIS — I70249 Atherosclerosis of native arteries of left leg with ulceration of unspecified site: Secondary | ICD-10-CM | POA: Diagnosis not present

## 2016-05-17 ENCOUNTER — Other Ambulatory Visit: Payer: Self-pay

## 2016-05-17 DIAGNOSIS — R339 Retention of urine, unspecified: Secondary | ICD-10-CM | POA: Diagnosis not present

## 2016-05-17 DIAGNOSIS — I70234 Atherosclerosis of native arteries of right leg with ulceration of heel and midfoot: Secondary | ICD-10-CM | POA: Diagnosis not present

## 2016-05-17 DIAGNOSIS — G35 Multiple sclerosis: Secondary | ICD-10-CM | POA: Diagnosis not present

## 2016-05-17 DIAGNOSIS — L97929 Non-pressure chronic ulcer of unspecified part of left lower leg with unspecified severity: Secondary | ICD-10-CM | POA: Diagnosis not present

## 2016-05-17 DIAGNOSIS — I70249 Atherosclerosis of native arteries of left leg with ulceration of unspecified site: Secondary | ICD-10-CM | POA: Diagnosis not present

## 2016-05-17 DIAGNOSIS — L97219 Non-pressure chronic ulcer of right calf with unspecified severity: Secondary | ICD-10-CM | POA: Diagnosis not present

## 2016-05-17 DIAGNOSIS — I10 Essential (primary) hypertension: Secondary | ICD-10-CM | POA: Diagnosis not present

## 2016-05-17 DIAGNOSIS — Z792 Long term (current) use of antibiotics: Secondary | ICD-10-CM | POA: Diagnosis not present

## 2016-05-17 DIAGNOSIS — I70232 Atherosclerosis of native arteries of right leg with ulceration of calf: Secondary | ICD-10-CM | POA: Diagnosis not present

## 2016-05-17 DIAGNOSIS — J449 Chronic obstructive pulmonary disease, unspecified: Secondary | ICD-10-CM | POA: Diagnosis not present

## 2016-05-17 DIAGNOSIS — L97419 Non-pressure chronic ulcer of right heel and midfoot with unspecified severity: Secondary | ICD-10-CM | POA: Diagnosis not present

## 2016-05-17 DIAGNOSIS — L03116 Cellulitis of left lower limb: Secondary | ICD-10-CM | POA: Diagnosis not present

## 2016-05-18 MED ORDER — TOPIRAMATE 50 MG PO TABS: 50.0000 mg | ORAL_TABLET | Freq: Two times a day (BID) | ORAL | 2 refills | Status: DC

## 2016-05-18 MED ORDER — GABAPENTIN 600 MG PO TABS: 600.0000 mg | ORAL_TABLET | Freq: Three times a day (TID) | ORAL | 2 refills | Status: DC

## 2016-05-18 NOTE — Telephone Encounter (Signed)
rx approved

## 2016-05-20 DIAGNOSIS — J449 Chronic obstructive pulmonary disease, unspecified: Secondary | ICD-10-CM | POA: Diagnosis not present

## 2016-05-20 DIAGNOSIS — L97219 Non-pressure chronic ulcer of right calf with unspecified severity: Secondary | ICD-10-CM | POA: Diagnosis not present

## 2016-05-20 DIAGNOSIS — I70249 Atherosclerosis of native arteries of left leg with ulceration of unspecified site: Secondary | ICD-10-CM | POA: Diagnosis not present

## 2016-05-20 DIAGNOSIS — L97929 Non-pressure chronic ulcer of unspecified part of left lower leg with unspecified severity: Secondary | ICD-10-CM | POA: Diagnosis not present

## 2016-05-20 DIAGNOSIS — G35 Multiple sclerosis: Secondary | ICD-10-CM | POA: Diagnosis not present

## 2016-05-20 DIAGNOSIS — I70234 Atherosclerosis of native arteries of right leg with ulceration of heel and midfoot: Secondary | ICD-10-CM | POA: Diagnosis not present

## 2016-05-20 DIAGNOSIS — I10 Essential (primary) hypertension: Secondary | ICD-10-CM | POA: Diagnosis not present

## 2016-05-20 DIAGNOSIS — L97419 Non-pressure chronic ulcer of right heel and midfoot with unspecified severity: Secondary | ICD-10-CM | POA: Diagnosis not present

## 2016-05-20 DIAGNOSIS — Z792 Long term (current) use of antibiotics: Secondary | ICD-10-CM | POA: Diagnosis not present

## 2016-05-20 DIAGNOSIS — L03116 Cellulitis of left lower limb: Secondary | ICD-10-CM | POA: Diagnosis not present

## 2016-05-20 DIAGNOSIS — I70232 Atherosclerosis of native arteries of right leg with ulceration of calf: Secondary | ICD-10-CM | POA: Diagnosis not present

## 2016-05-20 DIAGNOSIS — R339 Retention of urine, unspecified: Secondary | ICD-10-CM | POA: Diagnosis not present

## 2016-05-21 ENCOUNTER — Encounter: Payer: Medicare Other | Admitting: Internal Medicine

## 2016-05-21 DIAGNOSIS — G4733 Obstructive sleep apnea (adult) (pediatric): Secondary | ICD-10-CM | POA: Diagnosis not present

## 2016-05-21 DIAGNOSIS — I890 Lymphedema, not elsewhere classified: Secondary | ICD-10-CM | POA: Diagnosis not present

## 2016-05-21 DIAGNOSIS — I739 Peripheral vascular disease, unspecified: Secondary | ICD-10-CM | POA: Diagnosis not present

## 2016-05-21 DIAGNOSIS — L97512 Non-pressure chronic ulcer of other part of right foot with fat layer exposed: Secondary | ICD-10-CM | POA: Diagnosis not present

## 2016-05-21 DIAGNOSIS — I10 Essential (primary) hypertension: Secondary | ICD-10-CM | POA: Diagnosis not present

## 2016-05-21 DIAGNOSIS — J449 Chronic obstructive pulmonary disease, unspecified: Secondary | ICD-10-CM | POA: Diagnosis not present

## 2016-05-21 DIAGNOSIS — L97222 Non-pressure chronic ulcer of left calf with fat layer exposed: Secondary | ICD-10-CM | POA: Diagnosis not present

## 2016-05-21 DIAGNOSIS — Z79899 Other long term (current) drug therapy: Secondary | ICD-10-CM | POA: Diagnosis not present

## 2016-05-21 DIAGNOSIS — L043 Acute lymphadenitis of lower limb: Secondary | ICD-10-CM | POA: Diagnosis not present

## 2016-05-21 DIAGNOSIS — L97212 Non-pressure chronic ulcer of right calf with fat layer exposed: Secondary | ICD-10-CM | POA: Diagnosis not present

## 2016-05-21 DIAGNOSIS — G35 Multiple sclerosis: Secondary | ICD-10-CM | POA: Diagnosis not present

## 2016-05-22 NOTE — Progress Notes (Signed)
MENAAL, NEMEROFF ([id number]) Visit Report for 05/21/2016 Chief Complaint Document Details Patient Name: [name], [name] Date of Service: 05/21/2016 3:30 PM Medical Record Patient Account Number: [id number] Number: Treating RN: Montey Hora 01/04/1951 (66 y.o. Other Clinician: Date of Birth/Sex: Female) Treating Aria Pickrell Primary Care Provider: LADA, MELINDA Provider/Extender: G Referring Provider: LADA, MELINDA Weeks in Treatment: 26 Information Obtained from: Patient Chief Complaint returns for evaluation of bilateral lower extremity ulcerations Electronic Signature(s) Signed: 05/21/2016 4:44:13 PM By: Linton Ham MD Entered By: Linton Ham on 05/21/2016 16:22:56 [name] -------------------------------------------------------------------------------- Debridement Details Patient Name: [name] Date of Service: 05/21/2016 3:30 PM Medical Record Patient Account Number: [id number] Number: Treating RN: Montey Hora 23-Apr-1951 (66 y.o. Other Clinician: Date of Birth/Sex: Female) Treating Kentrail Shew, [place] Primary Care Provider: LADA, MELINDA Provider/Extender: G Referring Provider: LADA, MELINDA Weeks in Treatment: 26 Debridement Performed for Wound #9 Left,Lateral Lower Leg Assessment: Performed By: Physician Ricard Dillon, MD Debridement: Debridement Pre-procedure Yes - 15:58 Verification/Time Out Taken: Start Time: 15:58 Pain Control: Lidocaine 4% Topical Solution Level: Skin/Subcutaneous Tissue Total Area Debrided (L x 0.7 (cm) x 0.5 (cm) = 0.35 (cm) W): Tissue and other Viable, Non-Viable, Fibrin/Slough, Subcutaneous material debrided: Instrument: Curette Bleeding: Minimum Hemostasis Achieved: Pressure End Time: 16:00 Procedural Pain: 0 Post Procedural Pain: 0 Response to Treatment: Procedure was tolerated well Post Debridement Measurements of Total Wound Length: (cm) 0.7 Width: (cm) 0.5 Depth:  (cm) 0.1 Volume: (cm) 0.027 Character of Wound/Ulcer Post Improved Debridement: Severity of Tissue Post Debridement: Fat layer exposed Post Procedure Diagnosis Same as Pre-procedure Electronic Signature(s) Signed: 05/21/2016 4:44:13 PM By: Linton Ham MD Signed: 05/21/2016 4:54:21 PM By: Baltazar Apo ([id number]) Entered By: Linton Ham on 05/21/2016 16:22:44 [name] -------------------------------------------------------------------------------- HPI Details Patient Name: [name] Date of Service: 05/21/2016 3:30 PM Medical Record Patient Account Number: [id number] Number: Treating RN: Montey Hora Jul 09, 1950 (66 y.o. Other Clinician: Date of Birth/Sex: Female) Treating Lumen Brinlee Primary Care Provider: LADA, MELINDA Provider/Extender: G Referring Provider: LADA, MELINDA Weeks in Treatment: 26 History of Present Illness Location: left and right lower extremity and dorsum of the right foot Quality: Patient reports experiencing a sharp pain to affected area(s). Severity: Patient rates the pain to be a 8 out of 10 especially with palpation Duration: Patient has had the wound for > 6 months prior to seeking treatment at the wound center Timing: Pain in wound is Intermittent in severity but persistent Context: The wound appeared gradually over time Modifying Factors: Other treatment(s) tried include: symptomatic treatment as advised by her PCP Associated Signs and Symptoms: Patient reports having increase swelling iin her bilateral lower extremities HPI Description: 66 year old with a past medical history significant for MS, urinary incontinence, and obesity. She has been seen in the wound clinic before for lower extremity ulcerations treated with compression therapy. she is also known to have hypertension, peripheral vascular disease, COPD, obstructive sleep apnea, lumbar radiculopathy, kyphoscoliosis, urinary  issues and tobacco abuse. Smokes a packet of cigarettes a day was recently seen at the [facility] for swelling of her legs and feet with a ulceration on the dorsum of the right foot which has been there for about 6 months. she was recently in the ER about a month ago where she was seen for shortness of breath and swelling of the legs and a chest x-ray was within normal limits had an increase in her BNP and was given Lasix and put on  doxycycline for a mild cellulitis and possible UTI. Wounds aresmaller today 11/13/15. Debrided and will continue Santyl. 12/21/2015 -- she was admitted to the hospital overnight on 12/18/2015 and diagnosed with urinary retention and cellulitis of the left lower leg. is past to take clindamycin and use Santyl for her wounds. 01/15/2016 -- come back to see [place] for almost a month and continues to be noncompliant with her dressings 01/30/16 patient presents today for a follow-up visit concerning her ongoing bilateral lower extremity wounds. We have not seen her for the past 2 weeks although we are supposed to be seen her for weekly visits. She currently has a Foley catheter. She tells me that the wounds are intensely painful especially with pressure and palpation at this point in time. Fortunately she is having no interval signs or symptoms of systemic infection but unfortunately the wounds have not improved dramatically since we last saw her. She does have home health coming to take care of her as well. She is currently not in any compression wraps. 02/06/16 ON evaluation today patient continues to experience discomfort regarding her bilateral lower extremity ulcers. She has continued to tolerate the dressing changes at this point in time and continues to have a Foley catheter as well. Fortunately she is back this week in the past she has been somewhat noncompliant with follow-ups I'm glad to see her today. She tells me that her pain level varies but can be  as high as a 7 out of 10 with manipulation of the wound. She tells me that she used to be on oxycodone which [name], [name] H. ([id number]) was managed by the pain clinic although she is no longer on that as she tells me that she was actually smoking marijuana at the time and when this was found out they discontinued her pain medication. She no longer is taking anything pain medication wise and she also does not smoke cigarettes nor marijuana at this point. 02/12/16; this is a patient I don't believe I have previously seen. She has multiple sclerosis. She has 4 punched-out areas on the anterior lateral left leg 2 areas on the right these are all in the same condition. Reasonably small [dime size] wounds each was some depth. Surface of these wounds does not look particularly healthy as there is adherent slough. There is no evidence of surrounding infection or inflammation. ABIs in this clinic were 0.87 on the right and 0.81 on the left. She is listed as having PAD and is a smoker. Not a diabetic 02/20/16 patient I gave antibiotics to last week for erythema around both wound sites on the left lateral leg and right lateral leg. This appears to be a lot better. One of the areas on the left leg is healed however she still has 3 punched-out open areas on the left lateral calf and one on the right lateral calf and one on the dorsal foot. She is an ex-smoker quitting 1 month ago. ABIs in this clinic were 0.87 on the right 0.81 on the left 03/12/16; this is a patient I really don't have a good sense of. It would appear for the first 5 or 6 weeks of her stay in this clinic she was cared for by Dr. Con Memos. She appeared on my schedule in mid October and I saw her twice. She has not been here however in 3 weeks. She has advanced Wound Care at home where she lives with her husband. She has multiple sclerosis. I saw her the first time  she had 4 punched-out small wounds on the anterior lateral left leg it appears  that she is now down to 2. She also had wounds on the lateral and medial aspect of her right leg which were also small and punched out. The only one that remains is on the right lateral. Because of the nature of her wound I went ahead and ordered formal arterial studies this showed an ABI of 1 on the right and one on the left. TBI of 1.4 on the right and 0.79 on the left. She had normal waveforms. She was in the emergency room on 02/28/16; there is a noted edema of her left leg after a fall. She had a duplex ultrasound that showed no evidence for an acute DVT from the left groin to the popliteal fossa. The study was limited in the calf veins due to edema. Also noticeable that she had a left inguinal enlarged lymph node up to 6.2 cm. She also had a left knee x-ray that showed no acute findings and a right foot x-ray that showed soft tissue swelling but no radiographic evidence of osteomyelitis. The patient does stop smoking 03-19-16 Ms Freemon presents today for evaluation of her bilateral lower extremity ulcerations. She states that she has not smoked in several weeks. She denies any pain or discomfort to bilateral lower extremities, tolerating compression therapy as ordered. 03/26/16; I have not seen this patient in 2 weeks however she has done very well with improvements in the areas in question. After we obtain normal arterial studies, increasing the 4-layer compression really seems to look done a nice job here. She has one open area on the left lateral leg and one on the medial right leg. These have filled in nicely and are now superficial wounds that showed epithelialized. I note the left inguinal lymph node at 6.2 cm. I may need to refer this back to the patient's primary doctor. 04/03/16; patient has 2 remaining wounds 1 on the left lateral leg and one on the right medial leg. Both of filled in nicely since we are able to increase her from a 3 layer to a 4 layer compression. I am also  following up on the lymph node notable on her left leg DVT rule out in November 04/10/16; area on the right lateral leg is healed. The area on the left leg is still open but looks considerably better with healthy granulation and less wound area. I12/20/17; last week we healed the patient doubt with regards to the wound on the right leg to her on compression stocking 20-30 mmHg as it turns out I don't think she had a stocking, predictably therefore she has reopened. The left leg as well as the wound on the lateral aspect. Been generally small line 04/24/16; we healed out her right leg 2 weeks ago to her own 20-30 mm compression stockings although as it turns out she didn't have these and did not purchase some apparently because of financial issues. The left leg wound is on the lateral aspect. Both of these wounds are just about healed. 05/01/16; her wounds on the left leg are healed out today. The area on the right leg is also healed. Vitamin diligent effort of our staff we have not been able to get any form of compression stocking to this patient. The CHRISTINIA, POLICASTRO ([id number]) orders for juxtalite's were given to home health this never materialized. We ordered compression stockings I don't think she was able to afford these. We had a  discussion today with both the patient and her husband without these, will accumulate edema and the wounds will simply reopen again. 05/14/16 READMISSION this is a patient we healed out 2 weeks ago. As bilateral lower extremity venous wounds probably some degree of lymphedema. At the beginning in November I did a duplex ultrasound of her left leg that was negative for DVT but did show a lymph node in the left inguinal area presumably reactive. We've been using compression to her lower extremities eventually healed all her wounds on her bilateral calves but we were not able to get any form of compression stockings for her in spite of intense efforts of our staff.  She returns today with reopening on the left leg did not the right she has several superficial areas anteriorly but an actual frank ulcer on the lateral left calf. This is about the size of a dime or smaller. She does not really complain of pain. The patient has a history of PAD however arterial studies we did in November were normal. Her ABIs and Doppler waveforms were both within the normal limits. The patient has a history of MS. In discussing things with her today it would appear that the opening was noted 2 weeks ago according to the patient. We gave her Tubigrip stockings when she left the clinic 05/21/16; she has not yet had the duplex ultrasound of her thigh, this is booked for Thursday. The wound on the left lateral lower leg is improved Electronic Signature(s) Signed: 05/21/2016 4:44:13 PM By: Linton Ham MD Entered By: Linton Ham on 05/21/2016 16:23:46 [name], [name] ([id number]) -------------------------------------------------------------------------------- Physical Exam Details Patient Name: [name] Date of Service: 05/21/2016 3:30 PM Medical Record Patient Account Number: [id number] Number: Treating RN: Montey Hora 03/22/1951 (65 y.o. Other Clinician: Date of Birth/Sex: Female) Treating Arrayah Connors Primary Care Provider: LADA, MELINDA Provider/Extender: G Referring Provider: LADA, MELINDA Weeks in Treatment: 67 Constitutional Patient is hypertensive.. Pulse regular and within target range for patient.Marland Kitchen Respirations regular, non-labored and within target range.. Temperature is normal and within the target range for the patient.. Patient's appearance is neat and clean. Appears in no acute distress. Well nourished and well developed.. Eyes Conjunctivae clear. No discharge.. Notes Wound exam; the area on the right leg is closed. On the lateral left calf small open area which is better than last week. This was debrided with a #3 curet  hemostasis with direct pressure. She continues to have considerable amount of edema which is probably lymphedema in the left thigh. This is tender especially medially Electronic Signature(s) Signed: 05/21/2016 4:44:13 PM By: Linton Ham MD Entered By: Linton Ham on 05/21/2016 16:25:07 [name] ([id number]) -------------------------------------------------------------------------------- Physician Orders Details Patient Name: [name] Date of Service: 05/21/2016 3:30 PM Medical Record Patient Account Number: [id number] Number: Treating RN: Montey Hora Jun 12, 1950 (65 y.o. Other Clinician: Date of Birth/Sex: Female) Treating Mariaha Ellington, [place] Primary Care Provider: LADA, MELINDA Provider/Extender: G Referring Provider: LADA, MELINDA Weeks in Treatment: 66 Verbal / Phone Orders: Yes Clinician: Dorthy, Joanna Read Back and Verified: Yes Diagnosis Coding Wound Cleansing Wound #9 Left,Lateral Lower Leg o Cleanse wound with mild soap and water Skin Barriers/Peri-Wound Care Wound #9 Left,Lateral Lower Leg o Barrier cream o Moisturizing lotion Primary Wound Dressing Wound #9 Left,Lateral Lower Leg o Aquacel Secondary Dressing Wound #9 Left,Lateral Lower Leg o ABD pad Dressing Change Frequency Wound #9 Left,Lateral Lower Leg o Other: - twice weekly - once at [facility] and once by [facility] Follow-up  Appointments Wound #9 Left,Lateral Lower Leg o Return Appointment in 1 week. Edema Control Wound #9 Left,Lateral Lower Leg o 4-Layer Compression System - Left Lower Extremity Additional Orders / Instructions Wound #9 Left,Lateral Lower Leg o Increase protein intake. o Activity as tolerated [name], [name] H. ([id number]) Home Health Wound #9 [place] Nurse may visit PRN to address patientos wound care needs. o FACE TO FACE ENCOUNTER: MEDICARE and MEDICAID PATIENTS: I certify  that this patient is under my care and that I had a face-to-face encounter that meets the physician face-to-face encounter requirements with this patient on this date. The encounter with the patient was in whole or in part for the following MEDICAL CONDITION: (primary reason for [place]) MEDICAL NECESSITY: I certify, that based on my findings, NURSING services are a medically necessary home health service. HOME BOUND STATUS: I certify that my clinical findings support that this patient is homebound (i.e., Due to illness or injury, pt requires aid of supportive devices such as crutches, cane, wheelchairs, walkers, the use of special transportation or the assistance of another person to leave their place of residence. There is a normal inability to leave the home and doing so requires considerable and taxing effort. Other absences are for medical reasons / religious services and are infrequent or of short duration when for other reasons). o If current dressing causes regression in wound condition, may D/C ordered dressing product/s and apply Normal Saline Moist Dressing daily until next [place] / Other MD appointment. [place] of regression in wound condition at [phone]. o Please direct any NON-WOUND related issues/requests for orders to patient's Primary Care Physician Electronic Signature(s) Signed: 05/21/2016 4:44:13 PM By: Linton Ham MD Signed: 05/21/2016 4:54:21 PM By: Montey Hora Entered By: Montey Hora on 05/21/2016 16:03:31 [name] -------------------------------------------------------------------------------- Problem List Details Patient Name: [name] Date of Service: 05/21/2016 3:30 PM Medical Record Patient Account Number: [id number] Number: Treating RN: Montey Hora 02-Mar-1951 (65 y.o. 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 11/20/2015 Yes exposed L97.512 Non-pressure chronic ulcer of other part of right foot with 11/20/2015 Yes fat layer exposed G35 Multiple sclerosis 11/20/2015 Yes E66.01 Morbid (severe) obesity due to excess calories 11/20/2015 Yes F17.218 Nicotine dependence, cigarettes, with other nicotine- 11/20/2015 Yes induced disorders L04.3 Acute lymphadenitis of lower limb 04/03/2016 Yes Inactive Problems ICD-10 Code Description Active Date Inactive Date L97.222 Non-pressure chronic ulcer of left calf with fat layer 11/20/2015 11/20/2015 exposed Resolved Problems LETY, GARRABRANT ([id number]) Electronic Signature(s) Signed: 05/21/2016 4:44:13 PM By: Linton Ham MD Entered By: Linton Ham on 05/21/2016 16:22:22 [name] -------------------------------------------------------------------------------- Progress Note Details Patient Name: [name] Date of Service: 05/21/2016 3:30 PM Medical Record Patient Account Number: [id number] Number: Treating RN: Montey Hora [birth year] (65 y.o. Other Clinician: Date of Birth/Sex: Female) Treating Chundra Sauerwein Primary Care Provider: LADA, MELINDA Provider/Extender: G Referring Provider: LADA, MELINDA Weeks in Treatment: 26 Subjective Chief Complaint Information obtained from Patient returns for evaluation of bilateral lower extremity ulcerations History of Present Illness (HPI) The following HPI elements were documented for the patient's wound: Location: left and right lower extremity and dorsum of the right foot Quality: Patient reports experiencing a sharp pain  to affected area(s). Severity: Patient rates the pain to be a 8 out of 10 especially with palpation Duration: Patient has  had the wound for > 6 months prior to seeking treatment at the wound center Timing: Pain in wound is Intermittent in severity but persistent Context: The wound appeared gradually over time Modifying Factors: Other treatment(s) tried include: symptomatic treatment as advised by her PCP Associated Signs and Symptoms: Patient reports having increase swelling iin her bilateral lower extremities 66 year old with a past medical history significant for MS, urinary incontinence, and obesity. She has been seen in the wound clinic before for lower extremity ulcerations treated with compression therapy. she is also known to have hypertension, peripheral vascular disease, COPD, obstructive sleep apnea, lumbar radiculopathy, kyphoscoliosis, urinary issues and tobacco abuse. Smokes a packet of cigarettes a day was recently seen at the [facility] for swelling of her legs and feet with a ulceration on the dorsum of the right foot which has been there for about 6 months. she was recently in the ER about a month ago where she was seen for shortness of breath and swelling of the legs and a chest x-ray was within normal limits had an increase in her BNP and was given Lasix and put on doxycycline for a mild cellulitis and possible UTI. Wounds aresmaller today 11/13/15. Debrided and will continue Santyl. 12/21/2015 -- she was admitted to the hospital overnight on 12/18/2015 and diagnosed with urinary retention and cellulitis of the left lower leg. is past to take clindamycin and use Santyl for her wounds. 01/15/2016 -- come back to see [place] for almost a month and continues to be noncompliant with her dressings 01/30/16 patient presents today for a follow-up visit concerning her ongoing bilateral lower extremity wounds. We have not seen her for the past 2 weeks although we are supposed to be seen her for weekly visits. She currently has a Foley catheter. She tells me that the wounds are intensely painful  especially with pressure and palpation at this point in time. Fortunately she is having no interval signs or symptoms of systemic infection but unfortunately the wounds have not improved dramatically since we last saw her. She does have home health coming to take care of her as well. She is currently not in any compression wraps. EKATERINA, [name] ([id number]) 02/06/16 ON evaluation today patient continues to experience discomfort regarding her bilateral lower extremity ulcers. She has continued to tolerate the dressing changes at this point in time and continues to have a Foley catheter as well. Fortunately she is back this week in the past she has been somewhat noncompliant with follow-ups I'm glad to see her today. She tells me that her pain level varies but can be as high as a 7 out of 10 with manipulation of the wound. She tells me that she used to be on oxycodone which was managed by the pain clinic although she is no longer on that as she tells me that she was actually smoking marijuana at the time and when this was found out they discontinued her pain medication. She no longer is taking anything pain medication wise and she also does not smoke cigarettes nor marijuana at this point. 02/12/16; this is a patient I don't believe I have previously seen. She has multiple sclerosis. She has 4 punched-out areas on the anterior lateral left leg 2 areas on the right these are all in the same condition. Reasonably small [dime size] wounds each was some depth. Surface  of these wounds does not look particularly healthy as there is adherent slough. There is no evidence of surrounding infection or inflammation. ABIs in this clinic were 0.87 on the right and 0.81 on the left. She is listed as having PAD and is a smoker. Not a diabetic 02/20/16 patient I gave antibiotics to last week for erythema around both wound sites on the left lateral leg and right lateral leg. This appears to be a lot better. One of  the areas on the left leg is healed however she still has 3 punched-out open areas on the left lateral calf and one on the right lateral calf and one on the dorsal foot. She is an ex-smoker quitting 1 month ago. ABIs in this clinic were 0.87 on the right 0.81 on the left 03/12/16; this is a patient I really don't have a good sense of. It would appear for the first 5 or 6 weeks of her stay in this clinic she was cared for by Dr. Con Memos. She appeared on my schedule in mid October and I saw her twice. She has not been here however in 3 weeks. She has advanced Wound Care at home where she lives with her husband. She has multiple sclerosis. I saw her the first time she had 4 punched-out small wounds on the anterior lateral left leg it appears that she is now down to 2. She also had wounds on the lateral and medial aspect of her right leg which were also small and punched out. The only one that remains is on the right lateral. Because of the nature of her wound I went ahead and ordered formal arterial studies this showed an ABI of 1 on the right and one on the left. TBI of 1.4 on the right and 0.79 on the left. She had normal waveforms. She was in the emergency room on 02/28/16; there is a noted edema of her left leg after a fall. She had a duplex ultrasound that showed no evidence for an acute DVT from the left groin to the popliteal fossa. The study was limited in the calf veins due to edema. Also noticeable that she had a left inguinal enlarged lymph node up to 6.2 cm. She also had a left knee x-ray that showed no acute findings and a right foot x-ray that showed soft tissue swelling but no radiographic evidence of osteomyelitis. The patient does stop smoking 03-19-16 Ms Flowers presents today for evaluation of her bilateral lower extremity ulcerations. She states that she has not smoked in several weeks. She denies any pain or discomfort to bilateral lower extremities, tolerating compression therapy  as ordered. 03/26/16; I have not seen this patient in 2 weeks however she has done very well with improvements in the areas in question. After we obtain normal arterial studies, increasing the 4-layer compression really seems to look done a nice job here. She has one open area on the left lateral leg and one on the medial right leg. These have filled in nicely and are now superficial wounds that showed epithelialized. I note the left inguinal lymph node at 6.2 cm. I may need to refer this back to the patient's primary doctor. 04/03/16; patient has 2 remaining wounds 1 on the left lateral leg and one on the right medial leg. Both of filled in nicely since we are able to increase her from a 3 layer to a 4 layer compression. I am also following up on the lymph node notable on her left  leg DVT rule out in November 04/10/16; area on the right lateral leg is healed. The area on the left leg is still open but looks considerably better with healthy granulation and less wound area. I12/20/17; last week we healed the patient doubt with regards to the wound on the right leg to her on compression stocking 20-30 mmHg as it turns out I don't think she had a stocking, predictably therefore she [name], [name] H. ([id number]) has reopened. The left leg as well as the wound on the lateral aspect. Been generally small line 04/24/16; we healed out her right leg 2 weeks ago to her own 20-30 mm compression stockings although as it turns out she didn't have these and did not purchase some apparently because of financial issues. The left leg wound is on the lateral aspect. Both of these wounds are just about healed. 05/01/16; her wounds on the left leg are healed out today. The area on the right leg is also healed. Vitamin diligent effort of our staff we have not been able to get any form of compression stocking to this patient. The orders for juxtalite's were given to home health this never materialized. We ordered  compression stockings I don't think she was able to afford these. We had a discussion today with both the patient and her husband without these, will accumulate edema and the wounds will simply reopen again. 05/14/16 READMISSION this is a patient we healed out 2 weeks ago. As bilateral lower extremity venous wounds probably some degree of lymphedema. At the beginning in November I did a duplex ultrasound of her left leg that was negative for DVT but did show a lymph node in the left inguinal area presumably reactive. We've been using compression to her lower extremities eventually healed all her wounds on her bilateral calves but we were not able to get any form of compression stockings for her in spite of intense efforts of our staff. She returns today with reopening on the left leg did not the right she has several superficial areas anteriorly but an actual frank ulcer on the lateral left calf. This is about the size of a dime or smaller. She does not really complain of pain. The patient has a history of PAD however arterial studies we did in November were normal. Her ABIs and Doppler waveforms were both within the normal limits. The patient has a history of MS. In discussing things with her today it would appear that the opening was noted 2 weeks ago according to the patient. We gave her Tubigrip stockings when she left the clinic 05/21/16; she has not yet had the duplex ultrasound of her thigh, this is booked for Thursday. The wound on the left lateral lower leg is improved Objective Constitutional Patient is hypertensive.. Pulse regular and within target range for patient.Marland Kitchen Respirations regular, non-labored and within target range.. Temperature is normal and within the target range for the patient.. Patient's appearance is neat and clean. Appears in no acute distress. Well nourished and well developed.. Vitals Time Taken: 3:48 PM, Height: 63 in, Weight: 270 lbs, BMI: 47.8, Temperature: 98.3  F, Pulse: 83 bpm, Respiratory Rate: 20 breaths/min, Blood Pressure: 149/99 mmHg. Eyes Conjunctivae clear. No discharge.. General Notes: Wound exam; the area on the right leg is closed. On the lateral left calf small open area which is better than last week. This was debrided with a #3 curet hemostasis with direct pressure. She continues to have considerable amount of edema which is probably lymphedema  in the left thigh. This is tender especially medially [name], [name] H. ([id number]) Integumentary (Hair, Skin) Wound #9 status is Open. Original cause of wound was Shear/Friction. The wound is located on the Left,Lateral Lower Leg. The wound measures 0.7cm length x 0.5cm width x 0.1cm depth; 0.275cm^2 area and 0.027cm^3 volume. The wound is limited to skin breakdown. There is no tunneling or undermining noted. There is a small amount of serosanguineous drainage noted. The wound margin is distinct with the outline attached to the wound base. There is large (67-100%) pale granulation within the wound bed. There is no necrotic tissue within the wound bed. The periwound skin appearance exhibited: Mottled. The periwound skin appearance did not exhibit: Callus, Crepitus, Excoriation, Induration, Rash, Scarring, Dry/Scaly, Maceration, Atrophie Blanche, Cyanosis, Ecchymosis, Hemosiderin Staining, Pallor, Rubor, Erythema. Periwound temperature was noted as No Abnormality. The periwound has tenderness on palpation. 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 There  was a Skin/Subcutaneous Tissue Debridement [id number]) debridement with total area of 0.35 sq cm performed by Ricard Dillon, MD. with the following instrument(s): Curette to remove Viable and Non-Viable tissue/material including Fibrin/Slough and Subcutaneous after achieving pain control using Lidocaine 4% Topical Solution. A time out was conducted at 15:58, prior to the start of the procedure. A Minimum amount of bleeding was controlled with Pressure. The procedure was tolerated well with a pain level of 0 throughout and a pain level of 0 following the procedure. Post Debridement Measurements: 0.7cm length x 0.5cm width x 0.1cm depth; 0.027cm^3 volume. Character of Wound/Ulcer Post Debridement is improved. Severity of Tissue Post Debridement is: Fat layer exposed. Post procedure Diagnosis Wound #9: Same as Pre-Procedure [name], [name] ([id number]) Plan Wound Cleansing: Wound #9 Left,Lateral Lower Leg: Cleanse wound with mild soap and water Skin Barriers/Peri-Wound Care: Wound #9 Left,Lateral Lower Leg: Barrier cream Moisturizing lotion Primary Wound Dressing: Wound #9 Left,Lateral Lower Leg: Aquacel Secondary Dressing: Wound #9 Left,Lateral Lower Leg: ABD pad Dressing Change Frequency: Wound #9 Left,Lateral Lower Leg: Other: - twice weekly - once at [facility] and once by [facility] Follow-up Appointments: Wound #9 Left,Lateral Lower Leg: Return Appointment in 1 week. Edema Control: Wound #9 Left,Lateral Lower Leg: 4-Layer Compression System - Left Lower Extremity Additional Orders / Instructions: Wound #9 Left,Lateral Lower Leg: Increase protein intake. Activity as tolerated Home Health: Wound #9 Left,Lateral Lower Leg: [place] Nurse may visit PRN to address patient s wound care needs. FACE TO FACE ENCOUNTER: MEDICARE and MEDICAID PATIENTS: I certify that this patient is under my care and that I had a face-to-face encounter that meets the  physician face-to-face encounter requirements with this patient on this date. The encounter with the patient was in whole or in part for the following MEDICAL CONDITION: (primary reason for [place]) MEDICAL NECESSITY: I certify, that based on my findings, NURSING services are a medically necessary home health service. HOME BOUND STATUS: I certify that my clinical findings support that this patient is homebound (i.e., Due to illness or injury, pt requires aid of supportive devices such as crutches, cane, wheelchairs, walkers, the use of  special transportation or the assistance of another person to leave their place of residence. There is a normal inability to leave the home and doing so requires considerable and taxing effort. Other absences are for medical reasons / religious services and are infrequent or of short duration when for other reasons). If current dressing causes regression in wound condition, may D/C ordered dressing product/s and apply Normal Saline Moist Dressing daily until next [place] / Other MD appointment. [place] of regression in wound condition at [phone]. Please direct any NON-WOUND related issues/requests for orders to patient's Primary Care Physician [name], [name] ([id number]) #1 silver alginate and 4-layer wrap to the left leg #2 duplex ultrasound of the left thigh, if this is negative think she may need a CT scan of her abdomen and pelvis to look at the venous return Electronic Signature(s) Signed: 05/21/2016 4:44:13 PM By: Linton Ham MD Entered By: Linton Ham on 05/21/2016 16:26:13 [name] ([id number]) -------------------------------------------------------------------------------- SuperBill Details Patient Name: [name] Date of Service: 05/21/2016 Medical Record Patient Account Number: [id number] Number: Treating RN: Montey Hora 1950/07/17 (65 y.o. Other Clinician: Date of  Birth/Sex: Female) Treating Dennie Vecchio, [place] Primary Care Provider: LADA, MELINDA Provider/Extender: G Referring Provider: LADA, MELINDA Weeks in Treatment: 26 Diagnosis Coding ICD-10 Codes Code Description L97.212 Non-pressure chronic ulcer of right calf with fat layer exposed L97.512 Non-pressure chronic ulcer of other part of right foot with fat layer exposed G35 Multiple sclerosis E66.01 Morbid (severe) obesity due to excess calories F17.218 Nicotine dependence, cigarettes, with other nicotine-induced disorders L04.3 Acute lymphadenitis of lower limb L97.221 Non-pressure chronic ulcer of left calf limited to breakdown of skin Facility Procedures CPT4 Code Description: [id number] - DEB SUBQ TISSUE 20 SQ CM/< ICD-10 Description Diagnosis L97.221 Non-pressure chronic ulcer of left calf limited to Modifier: breakdown of sk Quantity: 1 in Physician Procedures CPT4 Code Description: [id number] - WC PHYS SUBQ TISS 20 SQ CM ICD-10 Description Diagnosis L97.221 Non-pressure chronic ulcer of left calf limited to Modifier: breakdown of sk Quantity: 1 in Electronic Signature(s) Signed: 05/21/2016 4:44:13 PM By: Linton Ham MD Entered By: Linton Ham on 05/21/2016 16:27:40

## 2016-05-22 NOTE — Progress Notes (Signed)
[name] Visit Report for 05/21/2016 Arrival Information Details Patient Name: [name], [name] Date of Service: 05/21/2016 3:30 PM Medical Record Number: [id number] Patient Account Number: [id number] Date of Birth/Sex: [birth year] (66 y.o. Female) Treating RN: Montey Hora Primary Care Hristopher Missildine: LADA, [place] Other Clinician: Referring Rayon Mcchristian: LADA, [place] Treating Ronnell Clinger/Extender: Tito Dine in Treatment: 26 Visit Information History Since Last Visit Added or deleted any medications: No Patient Arrived: Walker Any new allergies or adverse reactions: No Arrival Time: 15:44 Had a fall or experienced change in No Accompanied By: self activities of daily living that may affect Transfer Assistance: None risk of falls: Patient Identification Verified: Yes Signs or symptoms of abuse/neglect since last No Secondary Verification Process Completed: Yes visito Patient Requires Transmission-Based No Hospitalized since last visit: No Precautions: Has Compression in Place as Prescribed: Yes Patient Has Alerts: Yes Pain Present Now: No Electronic Signature(s) Signed: 05/21/2016 4:54:21 PM By: Montey Hora Entered By: Montey Hora on 05/21/2016 15:44:48 [name] -------------------------------------------------------------------------------- Encounter Discharge Information Details Patient Name: [name] Date of Service: 05/21/2016 3:30 PM Medical Record Number: [id number] Patient Account Number: [id number] Date of Birth/Sex: Mar 08, 1951 (66 y.o. Female) Treating RN: Montey Hora Primary Care Turrell Severt: LADA, [place] Other Clinician: Referring Anglia Blakley: LADA, [place] Treating Teva Bronkema/Extender: Tito Dine in Treatment: 26 Encounter Discharge Information Items Discharge Pain Level: 0 Discharge Condition: Stable Ambulatory Status: Walker Discharge Destination: Home Private Transportation: Auto Accompanied  By: self Schedule Follow-up Appointment: Yes Medication Reconciliation completed and No provided to Patient/Care Verline Kong: Clinical Summary of Care: Provided Form Type Recipient Paper Patient bw Electronic Signature(s) Signed: 05/21/2016 4:26:07 PM By: Lorine Bears RCP, RRT, CHT Entered By: Lorine Bears on 05/21/2016 16:26:07 [name] -------------------------------------------------------------------------------- Lower Extremity Assessment Details Patient Name: [name] Date of Service: 05/21/2016 3:30 PM Medical Record Number: [id number] Patient Account Number: [id number] Date of Birth/Sex: Sep 23, 1950 (66 y.o. Female) Treating RN: Montey Hora Primary Care Khadija Thier: LADA, [place] Other Clinician: Referring Aaralynn Shepheard: LADA, [place] Treating Rosalie Buenaventura/Extender: Ricard Dillon Weeks in Treatment: 26 Edema Assessment Assessed: [Left: No] [Right: No] Edema: [Left: No] [Right: No] Calf Left: Right: Point of Measurement: 33 cm From Medial Instep 37.4 cm cm Ankle Left: Right: Point of Measurement: 11 cm From Medial Instep 20.6 cm cm Vascular Assessment Pulses: Dorsalis Pedis Palpable: [Left:Yes] Posterior Tibial Extremity colors, hair growth, and conditions: Extremity Color: [Left:Hyperpigmented] Hair Growth on Extremity: [Left:No] Temperature of Extremity: [Left:Warm] Capillary Refill: [Left:< 3 seconds] Electronic Signature(s) Signed: 05/21/2016 4:54:21 PM By: Montey Hora Entered By: Montey Hora on 05/21/2016 15:59:10 [name], [name] -------------------------------------------------------------------------------- Multi Wound Chart Details Patient Name: [name] Date of Service: 05/21/2016 3:30 PM Medical Record Number: [id number] Patient Account Number: [id number] Date of Birth/Sex: [birth year] (66 y.o. Female) Treating RN: Montey Hora Primary Care Jaydin Jalomo: LADA, [place] Other  Clinician: Referring Mandie Crabbe: LADA, [place] Treating Theresia Pree/Extender: Ricard Dillon Weeks in Treatment: 26 Vital Signs Height(in): 63 Pulse(bpm): 83 Weight(lbs): 270 Blood Pressure 149/99 (mmHg): Body Mass Index(BMI): 48 Temperature(F): 98.3 Respiratory Rate 20 (breaths/min): Photos: [N/A:N/A] Wound Location: Left Lower Leg - Lateral N/A N/A Wounding Event: Shear/Friction N/A N/A Primary Etiology: Lymphedema N/A N/A Comorbid History: Cataracts, Chronic N/A N/A Obstructive Pulmonary Disease (COPD), Sleep Apnea, Hypertension, Osteoarthritis, Neuropathy Date Acquired: 05/09/2016 N/A N/A Weeks of Treatment: 1 N/A N/A Wound Status: Open N/A N/A Measurements L x W x D 0.7x0.5x0.1 N/A N/A (cm) Area (cm) : 0.275 N/A N/A Volume (cm) : 0.027 N/A N/A %  Reduction in Area: 65.00% N/A N/A % Reduction in Volume: 65.80% N/A N/A Classification: Partial Thickness N/A N/A Exudate Amount: Small N/A N/A Exudate Type: Serosanguineous N/A N/A Exudate Color: red, brown N/A N/A Wound Margin: Distinct, outline attached N/A N/A Granulation Amount: Large (67-100%) N/A N/A [name], [name] ([id number]) Granulation Quality: Pale N/A N/A Necrotic Amount: None Present (0%) N/A N/A Exposed Structures: Fascia: No N/A N/A Fat Layer (Subcutaneous Tissue) Exposed: No Tendon: No Muscle: No Joint: No Bone: No Limited to Skin Breakdown Epithelialization: Medium (34-66%) N/A N/A Debridement: Debridement [id number] N/A N/A [id number]) Pre-procedure 15:58 N/A N/A Verification/Time Out Taken: Pain Control: Lidocaine 4% Topical N/A N/A Solution Tissue Debrided: Fibrin/Slough, N/A N/A Subcutaneous Level: Skin/Subcutaneous N/A N/A Tissue Debridement Area (sq 0.35 N/A N/A cm): Instrument: Curette N/A N/A Bleeding: Minimum N/A N/A Hemostasis Achieved: Pressure N/A N/A Procedural Pain: 0 N/A N/A Post Procedural Pain: 0 N/A N/A Debridement Treatment Procedure was tolerated N/A N/A Response:  well Post Debridement 0.7x0.5x0.1 N/A N/A Measurements L x W x D (cm) Post Debridement 0.027 N/A N/A Volume: (cm) Periwound Skin Texture: Excoriation: No N/A N/A Induration: No Callus: No Crepitus: No Rash: No Scarring: No Periwound Skin Maceration: No N/A N/A Moisture: Dry/Scaly: No Periwound Skin Color: Mottled: Yes N/A N/A Atrophie Blanche: No Cyanosis: No Ecchymosis: No Erythema: No Radabaugh, Jannely H. ([id number]) Hemosiderin Staining: No Pallor: No Rubor: No Temperature: No Abnormality N/A N/A Tenderness on Yes N/A N/A Palpation: Wound Preparation: Ulcer Cleansing: Other: N/A N/A surg scrub and water Topical Anesthetic Applied: Other: lidocaine 4% Procedures Performed: Debridement N/A N/A Treatment Notes Electronic Signature(s) Signed: 05/21/2016 4:44:13 PM By: Linton Ham MD Entered By: Linton Ham on 05/21/2016 16:22:31 [name], [name] ([id number]) -------------------------------------------------------------------------------- Multi-Disciplinary Care Plan Details Patient Name: [name] Date of Service: 05/21/2016 3:30 PM Medical Record Number: [id number] Patient Account Number: [id number] Date of Birth/Sex: 1951-01-25 (66 y.o. Female) Treating RN: Montey Hora Primary Care Lewis Keats: LADA, [place] Other Clinician: Referring Gwendlyn Hanback: LADA, [place] Treating Muhammed Teutsch/Extender: Tito Dine in Treatment: 26 Active Inactive ` Abuse / Safety / Falls / Self Care Management Nursing Diagnoses: Impaired physical mobility Potential for falls Goals: Patient will remain injury free Date Initiated: 11/20/2015 Target Resolution Date: 05/28/2016 Goal Status: Active Interventions: Assess fall risk on admission and as needed Notes: ` Nutrition Nursing Diagnoses: Imbalanced nutrition Goals: Patient/caregiver agrees to and verbalizes understanding of need to use nutritional supplements and/or vitamins as prescribed Date Initiated:  11/20/2015 Target Resolution Date: 05/28/2016 Goal Status: Active Interventions: Provide education on nutrition Treatment Activities: Education provided on Nutrition : 05/14/2016 Notes: [name], [name] ([id number]) Orientation to the Wound Care Program Nursing Diagnoses: Knowledge deficit related to the wound healing center program Goals: Patient/caregiver will verbalize understanding of the [place] Program Date Initiated: 11/20/2015 Target Resolution Date: 05/28/2016 Goal Status: Active Interventions: Provide education on orientation to the wound center Notes: ` Wound/Skin Impairment Nursing Diagnoses: Impaired tissue integrity Goals: Patient will demonstrate a reduced rate of smoking or cessation of smoking Date Initiated: 11/20/2015 Target Resolution Date: 06/18/2016 Goal Status: Active Ulcer/skin breakdown will heal within 14 weeks Date Initiated: 11/20/2015 Target Resolution Date: 07/16/2016 Goal Status: Active Interventions: Assess patient/caregiver ability to obtain necessary supplies Provide education on smoking Notes: Electronic Signature(s) Signed: 05/21/2016 4:54:21 PM By: Montey Hora Entered By: Montey Hora on 05/21/2016 15:59:44 [name] ([id number]) -------------------------------------------------------------------------------- Pain Assessment Details Patient Name: [name] Date of Service: 05/21/2016 3:30 PM Medical Record Number: [id number] Patient Account Number: [id number] Date  of Birth/Sex: 1950-09-08 (66 y.o. Female) Treating RN: Montey Hora Primary Care Addelyn Alleman: LADA, [place] Other Clinician: Referring Mandeep Ferch: LADA, [place] Treating Felissa Blouch/Extender: Tito Dine in Treatment: 26 Active Problems Location of Pain Severity and Description of Pain Patient Has Paino No Site Locations Pain Management and Medication Current Pain Management: Notes Topical or injectable lidocaine is offered to patient for  acute pain when surgical debridement is performed. If needed, Patient is instructed to use over the counter pain medication for the following 24-48 hours after debridement. Wound care MDs do not prescribed pain medications. Patient has chronic pain or uncontrolled pain. Patient has been instructed to make an appointment with their Primary Care Physician for pain management. Electronic Signature(s) Signed: 05/21/2016 4:54:21 PM By: Montey Hora Entered By: Montey Hora on 05/21/2016 15:47:25 [name] ([id number]) -------------------------------------------------------------------------------- Patient/Caregiver Education Details Patient Name: [name] Date of Service: 05/21/2016 3:30 PM Medical Record Patient Account Number: [id number] Number: Treating RN: Montey Hora August 17, 1950 (65 y.o. Other Clinician: Date of Birth/Gender: Female) Treating ROBSON, [place] Primary Care Physician: LADA, MELINDA Physician/Extender: G Referring Physician: LADA, MELINDA Weeks in Treatment: 66 Education Assessment Education Provided To: Patient Education Topics Provided Venous: Handouts: Other: leg elevation Methods: Explain/Verbal Responses: State content correctly Electronic Signature(s) Signed: 05/21/2016 4:54:21 PM By: Montey Hora Entered By: Montey Hora on 05/21/2016 16:01:53 [name], [name] ([id number]) -------------------------------------------------------------------------------- Wound Assessment Details Patient Name: [name] Date of Service: 05/21/2016 3:30 PM Medical Record Number: [id number] Patient Account Number: [id number] Date of Birth/Sex: 07-Jan-1951 (66 y.o. Female) Treating RN: Montey Hora Primary Care Jocelyn Nold: LADA, [place] Other Clinician: Referring Money Mckeithan: LADA, [place] Treating Snow Peoples/Extender: Ricard Dillon Weeks in Treatment: 26 Wound Status Wound Number: 9 Primary Lymphedema Etiology: Wound Location: Left Lower Leg -  Lateral Wound Open Wounding Event: Shear/Friction Status: Date Acquired: 05/09/2016 Comorbid Cataracts, Chronic Obstructive Weeks Of Treatment: 1 History: Pulmonary Disease (COPD), Sleep Clustered Wound: No Apnea, Hypertension, Osteoarthritis, Neuropathy Photos Wound Measurements Length: (cm) 0.7 Width: (cm) 0.5 Depth: (cm) 0.1 Area: (cm) 0.275 Volume: (cm) 0.027 % Reduction in Area: 65% % Reduction in Volume: 65.8% Epithelialization: Medium (34-66%) Tunneling: No Undermining: No Wound Description Classification: Partial Thickness Wound Margin: Distinct, outline attached Exudate Amount: Small Exudate Type: Serosanguineous Exudate Color: red, brown Foul Odor After Cleansing: No Wound Bed Granulation Amount: Large (67-100%) Exposed Structure Granulation Quality: Pale Fascia Exposed: No Necrotic Amount: None Present (0%) Fat Layer (Subcutaneous Tissue) Exposed: No Tendon Exposed: No [name], [name] H. ([id number]) Muscle Exposed: No Joint Exposed: No Bone Exposed: No Limited to Skin Breakdown Periwound Skin Texture Texture Color No Abnormalities Noted: No No Abnormalities Noted: No Callus: No Atrophie Blanche: No Crepitus: No Cyanosis: No Excoriation: No Ecchymosis: No Induration: No Erythema: No Rash: No Hemosiderin Staining: No Scarring: No Mottled: Yes Pallor: No Moisture Rubor: No No Abnormalities Noted: No Dry / Scaly: No Temperature / Pain Maceration: No Temperature: No Abnormality Tenderness on Palpation: Yes Wound Preparation Ulcer Cleansing: Other: surg scrub and water, Topical Anesthetic Applied: Other: lidocaine 4%, Treatment Notes Wound #9 (Left, Lateral Lower Leg) 1. Cleansed with: Clean wound with Normal Saline 4. Dressing Applied: Aquacel 5. Secondary Dressing Applied ABD Pad 7. Secured with 4-Layer Compression System - Left Lower Extremity Electronic Signature(s) Signed: 05/21/2016 4:54:21 PM By: Montey Hora Entered By:  Montey Hora on 05/21/2016 15:54:51 [name] ([id number]) -------------------------------------------------------------------------------- Vitals Details Patient Name: [name] Date of Service: 05/21/2016 3:30 PM Medical Record Number: [id number] Patient Account Number: [id number] Date of Birth/Sex:  10/17/1950 (66 y.o. Female) Treating RN: Montey Hora Primary Care Khamil Lamica: LADA, [place] Other Clinician: Referring Declynn Lopresti: LADA, [place] Treating Rakisha Pincock/Extender: Tito Dine in Treatment: 26 Vital Signs Time Taken: 15:48 Temperature (F): 98.3 Height (in): 63 Pulse (bpm): 83 Weight (lbs): 270 Respiratory Rate (breaths/min): 20 Body Mass Index (BMI): 47.8 Blood Pressure (mmHg): 149/99 Reference Range: 80 - 120 mg / dl Electronic Signature(s) Signed: 05/21/2016 4:54:21 PM By: Montey Hora Entered By: Montey Hora on 05/21/2016 15:48:13

## 2016-05-23 ENCOUNTER — Ambulatory Visit: Payer: Medicare Other

## 2016-05-23 ENCOUNTER — Ambulatory Visit: Payer: Medicare Other | Admitting: Family Medicine

## 2016-05-24 ENCOUNTER — Ambulatory Visit
Admission: RE | Admit: 2016-05-24 | Discharge: 2016-05-24 | Disposition: A | Discharge status: Home / Self Care | Payer: Medicare Other | Source: Ambulatory Visit | Attending: Internal Medicine | Admitting: Internal Medicine

## 2016-05-24 DIAGNOSIS — I10 Essential (primary) hypertension: Secondary | ICD-10-CM | POA: Diagnosis not present

## 2016-05-24 DIAGNOSIS — M79605 Pain in left leg: Secondary | ICD-10-CM | POA: Diagnosis not present

## 2016-05-24 DIAGNOSIS — M7989 Other specified soft tissue disorders: Secondary | ICD-10-CM | POA: Insufficient documentation

## 2016-05-24 DIAGNOSIS — M79652 Pain in left thigh: Secondary | ICD-10-CM | POA: Insufficient documentation

## 2016-05-24 DIAGNOSIS — I70232 Atherosclerosis of native arteries of right leg with ulceration of calf: Secondary | ICD-10-CM | POA: Diagnosis not present

## 2016-05-24 DIAGNOSIS — I70234 Atherosclerosis of native arteries of right leg with ulceration of heel and midfoot: Secondary | ICD-10-CM | POA: Diagnosis not present

## 2016-05-24 DIAGNOSIS — I825Y2 Chronic embolism and thrombosis of unspecified deep veins of left proximal lower extremity: Secondary | ICD-10-CM

## 2016-05-24 DIAGNOSIS — L97929 Non-pressure chronic ulcer of unspecified part of left lower leg with unspecified severity: Secondary | ICD-10-CM | POA: Diagnosis not present

## 2016-05-24 DIAGNOSIS — Z792 Long term (current) use of antibiotics: Secondary | ICD-10-CM | POA: Diagnosis not present

## 2016-05-24 DIAGNOSIS — I70249 Atherosclerosis of native arteries of left leg with ulceration of unspecified site: Secondary | ICD-10-CM | POA: Diagnosis not present

## 2016-05-24 DIAGNOSIS — L03116 Cellulitis of left lower limb: Secondary | ICD-10-CM | POA: Diagnosis not present

## 2016-05-24 DIAGNOSIS — G35 Multiple sclerosis: Secondary | ICD-10-CM | POA: Diagnosis not present

## 2016-05-24 DIAGNOSIS — L97219 Non-pressure chronic ulcer of right calf with unspecified severity: Secondary | ICD-10-CM | POA: Diagnosis not present

## 2016-05-24 DIAGNOSIS — J449 Chronic obstructive pulmonary disease, unspecified: Secondary | ICD-10-CM | POA: Diagnosis not present

## 2016-05-24 DIAGNOSIS — L97419 Non-pressure chronic ulcer of right heel and midfoot with unspecified severity: Secondary | ICD-10-CM | POA: Diagnosis not present

## 2016-05-24 DIAGNOSIS — R339 Retention of urine, unspecified: Secondary | ICD-10-CM | POA: Diagnosis not present

## 2016-05-27 DIAGNOSIS — I70232 Atherosclerosis of native arteries of right leg with ulceration of calf: Secondary | ICD-10-CM | POA: Diagnosis not present

## 2016-05-27 DIAGNOSIS — J449 Chronic obstructive pulmonary disease, unspecified: Secondary | ICD-10-CM | POA: Diagnosis not present

## 2016-05-27 DIAGNOSIS — I10 Essential (primary) hypertension: Secondary | ICD-10-CM | POA: Diagnosis not present

## 2016-05-27 DIAGNOSIS — R339 Retention of urine, unspecified: Secondary | ICD-10-CM | POA: Diagnosis not present

## 2016-05-27 DIAGNOSIS — L03116 Cellulitis of left lower limb: Secondary | ICD-10-CM | POA: Diagnosis not present

## 2016-05-27 DIAGNOSIS — G35 Multiple sclerosis: Secondary | ICD-10-CM | POA: Diagnosis not present

## 2016-05-27 DIAGNOSIS — L97929 Non-pressure chronic ulcer of unspecified part of left lower leg with unspecified severity: Secondary | ICD-10-CM | POA: Diagnosis not present

## 2016-05-27 DIAGNOSIS — L97219 Non-pressure chronic ulcer of right calf with unspecified severity: Secondary | ICD-10-CM | POA: Diagnosis not present

## 2016-05-27 DIAGNOSIS — I70234 Atherosclerosis of native arteries of right leg with ulceration of heel and midfoot: Secondary | ICD-10-CM | POA: Diagnosis not present

## 2016-05-27 DIAGNOSIS — I70249 Atherosclerosis of native arteries of left leg with ulceration of unspecified site: Secondary | ICD-10-CM | POA: Diagnosis not present

## 2016-05-27 DIAGNOSIS — L97419 Non-pressure chronic ulcer of right heel and midfoot with unspecified severity: Secondary | ICD-10-CM | POA: Diagnosis not present

## 2016-05-27 DIAGNOSIS — Z792 Long term (current) use of antibiotics: Secondary | ICD-10-CM | POA: Diagnosis not present

## 2016-05-28 ENCOUNTER — Encounter: Payer: Medicare Other | Admitting: Internal Medicine

## 2016-05-28 DIAGNOSIS — I10 Essential (primary) hypertension: Secondary | ICD-10-CM | POA: Diagnosis not present

## 2016-05-28 DIAGNOSIS — L97512 Non-pressure chronic ulcer of other part of right foot with fat layer exposed: Secondary | ICD-10-CM | POA: Diagnosis not present

## 2016-05-28 DIAGNOSIS — L97222 Non-pressure chronic ulcer of left calf with fat layer exposed: Secondary | ICD-10-CM | POA: Diagnosis not present

## 2016-05-28 DIAGNOSIS — L97521 Non-pressure chronic ulcer of other part of left foot limited to breakdown of skin: Secondary | ICD-10-CM | POA: Diagnosis not present

## 2016-05-28 DIAGNOSIS — L03116 Cellulitis of left lower limb: Secondary | ICD-10-CM | POA: Diagnosis not present

## 2016-05-28 DIAGNOSIS — G35 Multiple sclerosis: Secondary | ICD-10-CM | POA: Diagnosis not present

## 2016-05-28 DIAGNOSIS — J449 Chronic obstructive pulmonary disease, unspecified: Secondary | ICD-10-CM | POA: Diagnosis not present

## 2016-05-28 DIAGNOSIS — L043 Acute lymphadenitis of lower limb: Secondary | ICD-10-CM | POA: Diagnosis not present

## 2016-05-28 DIAGNOSIS — G4733 Obstructive sleep apnea (adult) (pediatric): Secondary | ICD-10-CM | POA: Diagnosis not present

## 2016-05-28 DIAGNOSIS — L97229 Non-pressure chronic ulcer of left calf with unspecified severity: Secondary | ICD-10-CM | POA: Diagnosis not present

## 2016-05-28 DIAGNOSIS — L97212 Non-pressure chronic ulcer of right calf with fat layer exposed: Secondary | ICD-10-CM | POA: Diagnosis not present

## 2016-05-28 DIAGNOSIS — Z79899 Other long term (current) drug therapy: Secondary | ICD-10-CM | POA: Diagnosis not present

## 2016-05-28 DIAGNOSIS — I739 Peripheral vascular disease, unspecified: Secondary | ICD-10-CM | POA: Diagnosis not present

## 2016-05-29 ENCOUNTER — Telehealth: Payer: Self-pay | Admitting: Family Medicine

## 2016-05-29 ENCOUNTER — Emergency Department
Admission: EM | Admit: 2016-05-29 | Discharge: 2016-05-29 | Disposition: A | Discharge status: Home / Self Care | Payer: Medicare Other | Attending: Emergency Medicine | Admitting: Emergency Medicine

## 2016-05-29 ENCOUNTER — Encounter: Payer: Self-pay | Admitting: Emergency Medicine

## 2016-05-29 ENCOUNTER — Ambulatory Visit: Payer: Medicare Other | Admitting: Family Medicine

## 2016-05-29 DIAGNOSIS — R590 Localized enlarged lymph nodes: Secondary | ICD-10-CM | POA: Insufficient documentation

## 2016-05-29 DIAGNOSIS — I129 Hypertensive chronic kidney disease with stage 1 through stage 4 chronic kidney disease, or unspecified chronic kidney disease: Secondary | ICD-10-CM | POA: Diagnosis not present

## 2016-05-29 DIAGNOSIS — L03116 Cellulitis of left lower limb: Secondary | ICD-10-CM | POA: Diagnosis not present

## 2016-05-29 DIAGNOSIS — Z87891 Personal history of nicotine dependence: Secondary | ICD-10-CM | POA: Diagnosis not present

## 2016-05-29 DIAGNOSIS — S80212A Abrasion, left knee, initial encounter: Secondary | ICD-10-CM | POA: Diagnosis not present

## 2016-05-29 DIAGNOSIS — Y999 Unspecified external cause status: Secondary | ICD-10-CM | POA: Insufficient documentation

## 2016-05-29 DIAGNOSIS — W1839XA Other fall on same level, initial encounter: Secondary | ICD-10-CM | POA: Insufficient documentation

## 2016-05-29 DIAGNOSIS — Y929 Unspecified place or not applicable: Secondary | ICD-10-CM | POA: Diagnosis not present

## 2016-05-29 DIAGNOSIS — N183 Chronic kidney disease, stage 3 (moderate): Secondary | ICD-10-CM | POA: Diagnosis not present

## 2016-05-29 DIAGNOSIS — M25562 Pain in left knee: Secondary | ICD-10-CM | POA: Diagnosis not present

## 2016-05-29 DIAGNOSIS — Y9389 Activity, other specified: Secondary | ICD-10-CM | POA: Insufficient documentation

## 2016-05-29 DIAGNOSIS — W19XXXA Unspecified fall, initial encounter: Secondary | ICD-10-CM

## 2016-05-29 DIAGNOSIS — S80211A Abrasion, right knee, initial encounter: Secondary | ICD-10-CM | POA: Insufficient documentation

## 2016-05-29 DIAGNOSIS — Z7401 Bed confinement status: Secondary | ICD-10-CM | POA: Diagnosis not present

## 2016-05-29 DIAGNOSIS — Z79899 Other long term (current) drug therapy: Secondary | ICD-10-CM | POA: Diagnosis not present

## 2016-05-29 DIAGNOSIS — S8991XA Unspecified injury of right lower leg, initial encounter: Secondary | ICD-10-CM | POA: Diagnosis present

## 2016-05-29 DIAGNOSIS — J449 Chronic obstructive pulmonary disease, unspecified: Secondary | ICD-10-CM | POA: Insufficient documentation

## 2016-05-29 DIAGNOSIS — S80219A Abrasion, unspecified knee, initial encounter: Secondary | ICD-10-CM

## 2016-05-29 LAB — URINALYSIS, COMPLETE (UACMP) WITH MICROSCOPIC
Bacteria, UA: NONE SEEN
Bilirubin Urine: NEGATIVE
Glucose, UA: NEGATIVE mg/dL
Ketones, ur: NEGATIVE mg/dL
Nitrite: NEGATIVE
Protein, ur: NEGATIVE mg/dL
Specific Gravity, Urine: 1.005 (ref 1.005–1.030)
pH: 6 (ref 5.0–8.0)

## 2016-05-29 LAB — BASIC METABOLIC PANEL
Anion gap: 7 (ref 5–15)
BUN: 22 mg/dL — ABNORMAL HIGH (ref 6–20)
CO2: 27 mmol/L (ref 22–32)
Calcium: 8.8 mg/dL — ABNORMAL LOW (ref 8.9–10.3)
Chloride: 102 mmol/L (ref 101–111)
Creatinine, Ser: 1.36 mg/dL — ABNORMAL HIGH (ref 0.44–1.00)
GFR calc Af Amer: 46 mL/min — ABNORMAL LOW (ref >60)
GFR calc non Af Amer: 40 mL/min — ABNORMAL LOW (ref >60)
Glucose, Bld: 115 mg/dL — ABNORMAL HIGH (ref 65–99)
Potassium: 3.5 mmol/L (ref 3.5–5.1)
Sodium: 136 mmol/L (ref 135–145)

## 2016-05-29 LAB — CBC WITH DIFFERENTIAL/PLATELET
Basophils Absolute: 0.1 10*3/uL (ref 0–0.1)
Basophils Relative: 2 %
Eosinophils Absolute: 0.3 10*3/uL (ref 0–0.7)
Eosinophils Relative: 5 %
HCT: 33.8 % — ABNORMAL LOW (ref 35.0–47.0)
Hemoglobin: 11.5 g/dL — ABNORMAL LOW (ref 12.0–16.0)
Lymphocytes Relative: 17 %
Lymphs Abs: 1 10*3/uL (ref 1.0–3.6)
MCH: 28.8 pg (ref 26.0–34.0)
MCHC: 34.2 g/dL (ref 32.0–36.0)
MCV: 84.2 fL (ref 80.0–100.0)
Monocytes Absolute: 0.6 10*3/uL (ref 0.2–0.9)
Monocytes Relative: 10 %
Neutro Abs: 3.9 10*3/uL (ref 1.4–6.5)
Neutrophils Relative %: 66 %
Platelets: 241 10*3/uL (ref 150–440)
RBC: 4.01 MIL/uL (ref 3.80–5.20)
RDW: 15 % — ABNORMAL HIGH (ref 11.5–14.5)
WBC: 5.9 10*3/uL (ref 3.6–11.0)

## 2016-05-29 MED ORDER — SODIUM CHLORIDE 0.9 % IV BOLUS (SEPSIS)
1000.0000 mL | Freq: Once | INTRAVENOUS | Status: AC
  Administered 2016-05-29: 1000 mL via INTRAVENOUS

## 2016-05-29 MED ORDER — BACITRACIN ZINC 500 UNIT/GM EX OINT
TOPICAL_OINTMENT | CUTANEOUS | Status: AC
  Filled 2016-05-29: qty 1.8

## 2016-05-29 MED ORDER — ACETAMINOPHEN 500 MG PO TABS
1000.0000 mg | ORAL_TABLET | Freq: Once | ORAL | Status: AC
  Administered 2016-05-29: 1000 mg via ORAL
  Filled 2016-05-29: qty 2

## 2016-05-29 NOTE — Telephone Encounter (Signed)
I reviewed ER notes Large left inguinal lymph node is concerning No pelvic pain; all female organs; no nausea; no night sweats and no weight loss Hypotension; wonder if contributes to her fall I called patient to have her STOP her diuretic Let's get scan; could be reactive, but could be something worrisome Let's scan abd/pelvis; no allergies to contrast ---------------------- Staff-- please schedule CT scan and then schedule patient to see me the day after the CT scan to go over results

## 2016-05-29 NOTE — Assessment & Plan Note (Signed)
Order abd/pelvic CT scan

## 2016-05-29 NOTE — Progress Notes (Signed)
CRISTA, DEAKIN ([id number]) Visit Report for 05/28/2016 Arrival Information Details Patient Name: [name], [name] Date of Service: 05/28/2016 3:30 PM Medical Record Number: [id number] Patient Account Number: [id number] Date of Birth/Sex: [birth year] (66 y.o. Female) Treating RN: Montey Hora Primary Care Selisa Tensley: LADA, [place] Other Clinician: Referring Waleska Buttery: LADA, [place] Treating Cyani Kallstrom/Extender: [name] Dine in Treatment: 27 Visit Information History Since Last Visit Added or deleted any medications: No Patient Arrived: Wheel Chair Any new allergies or adverse reactions: No Arrival Time: 15:53 Had a fall or experienced change in No activities of daily living that may affect Accompanied By: self risk of falls: Transfer Assistance: None Signs or symptoms of abuse/neglect since last No Patient Identification Verified: Yes visito Secondary Verification Process Yes Hospitalized since last visit: No Completed: Has Dressing in Place as Prescribed: Yes Patient Requires Transmission-Based No Has Compression in Place as Prescribed: Yes Precautions: Pain Present Now: Yes Patient Has Alerts: Yes Electronic Signature(s) Signed: 05/28/2016 5:15:42 PM By: Montey Hora Entered By: Montey Hora on 05/28/2016 15:57:19 [name] -------------------------------------------------------------------------------- Clinic Level of Care Assessment Details Patient Name: [name] Date of Service: 05/28/2016 3:30 PM Medical Record Number: [id number] Patient Account Number: [id number] Date of Birth/Sex: Sep 04, 1950 (66 y.o. Female) Treating RN: Montey Hora Primary Care Breland Elders: LADA, [place] Other Clinician: Referring Ayodele Sangalang: LADA, [place] Treating Usiel [name]/Extender: [name] Dine in Treatment: 27 Clinic Level of Care Assessment Items TOOL 4 Quantity Score []  - Use when only an EandM is performed on FOLLOW-UP visit 0 ASSESSMENTS - Nursing  Assessment / Reassessment X - Reassessment of Co-morbidities (includes updates in patient status) 1 10 X - Reassessment of Adherence to Treatment Plan 1 5 ASSESSMENTS - Wound and Skin Assessment / Reassessment []  - Simple Wound Assessment / Reassessment - one wound 0 X - Complex Wound Assessment / Reassessment - multiple wounds 2 5 []  - Dermatologic / Skin Assessment (not related to wound area) 0 ASSESSMENTS - Focused Assessment []  - Circumferential Edema Measurements - multi extremities 0 []  - Nutritional Assessment / Counseling / Intervention 0 X - Lower Extremity Assessment (monofilament, tuning fork, pulses) 1 5 []  - Peripheral Arterial Disease Assessment (using hand held doppler) 0 ASSESSMENTS - Ostomy and/or Continence Assessment and Care []  - Incontinence Assessment and Management 0 []  - Ostomy Care Assessment and Management (repouching, etc.) 0 PROCESS - Coordination of Care X - Simple Patient / Family Education for ongoing care 1 15 []  - Complex (extensive) Patient / Family Education for ongoing care 0 []  - Staff obtains Programmer, systems, Records, Test Results / Process Orders 0 []  - Staff telephones HHA, Nursing Homes / Clarify orders / etc 0 []  - Routine Transfer to another Facility (non-emergent condition) 0 [name], [name]. ([id number]) []  - Routine Hospital Admission (non-emergent condition) 0 []  - New Admissions / Biomedical engineer / Ordering NPWT, Apligraf, etc. 0 []  - Emergency Hospital Admission (emergent condition) 0 X - Simple Discharge Coordination 1 10 []  - Complex (extensive) Discharge Coordination 0 PROCESS - Special Needs []  - Pediatric / Minor Patient Management 0 []  - Isolation Patient Management 0 []  - Hearing / Language / Visual special needs 0 []  - Assessment of Community assistance (transportation, D/C planning, etc.) 0 []  - Additional assistance / Altered mentation 0 []  - Support Surface(s) Assessment (bed, cushion, seat, etc.) 0 INTERVENTIONS - Wound  Cleansing / Measurement []  - Simple Wound Cleansing - one wound 0 X - Complex Wound Cleansing - multiple wounds 2 5 X - Wound Imaging (  photographs - any number of wounds) 1 5 []  - Wound Tracing (instead of photographs) 0 []  - Simple Wound Measurement - one wound 0 X - Complex Wound Measurement - multiple wounds 2 5 INTERVENTIONS - Wound Dressings X - Small Wound Dressing one or multiple wounds 2 10 []  - Medium Wound Dressing one or multiple wounds 0 []  - Large Wound Dressing one or multiple wounds 0 []  - Application of Medications - topical 0 []  - Application of Medications - injection 0 INTERVENTIONS - Miscellaneous []  - External ear exam 0 [name], [name]. ([id number]) []  - Specimen Collection (cultures, biopsies, blood, body fluids, etc.) 0 []  - Specimen(s) / Culture(s) sent or taken to Lab for analysis 0 []  - Patient Transfer (multiple staff / Harrel Lemon Lift / Similar devices) 0 []  - Simple Staple / Suture removal (25 or less) 0 []  - Complex Staple / Suture removal (26 or more) 0 []  - Hypo / Hyperglycemic Management (close monitor of Blood Glucose) 0 []  - Ankle / Brachial Index (ABI) - do not check if billed separately 0 X - Vital Signs 1 5 Has the patient been seen at the hospital within the last three years: Yes Total Score: 105 Level Of Care: New/Established - Level 3 Electronic Signature(s) Signed: 05/28/2016 5:15:42 PM By: Montey Hora Entered By: Montey Hora on 05/28/2016 16:59:47 [name] -------------------------------------------------------------------------------- Encounter Discharge Information Details Patient Name: [name] Date of Service: 05/28/2016 3:30 PM Medical Record Number: [id number] Patient Account Number: [id number] Date of Birth/Sex: [birth year] (66 y.o. Female) Treating RN: Montey Hora Primary Care Braylon Lemmons: LADA, [place] Other Clinician: Referring Graydon Fofana: LADA, [place] Treating Sae Handrich/Extender: [name] Dine  in Treatment: 27 Encounter Discharge Information Items Discharge Pain Level: 0 Discharge Condition: Stable Ambulatory Status: Wheelchair Discharge Destination: Home Transportation: Private Auto Accompanied By: self Schedule Follow-up Appointment: Yes Medication Reconciliation completed and provided to Patient/Care No [name]: Provided on Clinical Summary of Care: 05/28/2016 Form Type Recipient Paper Patient BW Electronic Signature(s) Signed: 05/28/2016 5:01:39 PM By: Montey Hora Previous Signature: 05/28/2016 4:42:22 PM Version By: Ruthine Dose Entered By: Montey Hora on 05/28/2016 17:01:39 [name] -------------------------------------------------------------------------------- Lower Extremity Assessment Details Patient Name: [name] Date of Service: 05/28/2016 3:30 PM Medical Record Number: [id number] Patient Account Number: [id number] Date of Birth/Sex: 1951-04-09 (66 y.o. Female) Treating RN: Montey Hora Primary Care Shardee Dieu: LADA, [place] Other Clinician: Referring Lameisha Schuenemann: LADA, [place] Treating Zyen Triggs/Extender: [name] Dine in Treatment: 27 Edema Assessment Assessed: [Left: No] [Right: No] E[Left: dema] [Right: :] Calf Left: Right: Point of Measurement: 33 cm From Medial Instep 37 cm cm Ankle Left: Right: Point of Measurement: 11 cm From Medial Instep 20.4 cm cm Vascular Assessment Pulses: Dorsalis Pedis Palpable: [Left:Yes] Posterior Tibial Extremity colors, hair growth, and conditions: Extremity Color: [Left:Normal] Hair Growth on Extremity: [Left:No] Temperature of Extremity: [Left:Warm] Capillary Refill: [Left:< 3 seconds] Electronic Signature(s) Signed: 05/28/2016 5:15:42 PM By: Montey Hora Entered By: Montey Hora on 05/28/2016 16:24:02 [name] -------------------------------------------------------------------------------- Multi Wound Chart Details Patient Name: [name] Date of Service: 05/28/2016 3:30 PM Medical Record Number: [id number] Patient Account Number: [id number] Date of Birth/Sex: 1951/03/08 (66 y.o. Female) Treating RN: Montey Hora Primary Care Jakhia Buxton: LADA, [place] Other Clinician: Referring Dynesha Woolen: LADA, [place] Treating Nautika Cressey/Extender: Ricard Dillon Weeks in Treatment: 27 Vital Signs Height(in): 63 Pulse(bpm): 84 Weight(lbs): 270 Blood Pressure 163/67 (mmHg): Body Mass Index(BMI): 48 Temperature(F): 98.0 Respiratory Rate 20 (breaths/min): Photos: [N/A:N/A] Wound Location: Left Metatarsal head  fifth - Left Lower Leg - Lateral N/A Plantar Wounding Event: Trauma Shear/Friction N/A Primary Etiology: Trauma, Other Lymphedema N/A Comorbid History: Cataracts, Chronic Cataracts, Chronic N/A Obstructive Pulmonary Obstructive Pulmonary Disease (COPD), Sleep Disease (COPD), Sleep Apnea, Hypertension, Apnea, Hypertension, Osteoarthritis, Neuropathy Osteoarthritis, Neuropathy Date Acquired: 05/26/2016 05/09/2016 N/A Weeks of Treatment: 0 2 N/A Wound Status: Open Open N/A Measurements L x W x D 0.5x1x0.1 0.1x0.1x0.1 N/A (cm) Area (cm) : 0.393 0.008 N/A Volume (cm) : 0.039 0.001 N/A % Reduction in Area: 0.00% 99.00% N/A % Reduction in Volume: 0.00% 98.70% N/A Classification: Partial Thickness Partial Thickness N/A Exudate Amount: Large None Present N/A Exudate Type: Serous N/A N/A Exudate Color: amber N/A N/A Wound Margin: Flat and Intact Distinct, outline attached N/A [name], [name]. ([id number]) Granulation Amount: Large (67-100%) Large (67-100%) N/A Granulation Quality: Pink Pale N/A Necrotic Amount: None Present (0%) None Present (0%) N/A Exposed Structures: Fascia: No Fascia: No N/A Fat Layer (Subcutaneous Fat Layer (Subcutaneous Tissue) Exposed: No Tissue) Exposed: No Tendon: No Tendon: No Muscle: No Muscle: No Joint: No Joint: No Bone: No Bone: No Limited to Skin Limited to Skin Breakdown  Breakdown Epithelialization: Medium (34-66%) Large (67-100%) N/A Periwound Skin Texture: Excoriation: No Excoriation: No N/A Induration: No Induration: No Callus: No Callus: No Crepitus: No Crepitus: No Rash: No Rash: No Scarring: No Scarring: No Periwound Skin Maceration: No Maceration: No N/A Moisture: Dry/Scaly: No Dry/Scaly: No Periwound Skin Color: Atrophie Blanche: No Mottled: Yes N/A Cyanosis: No Atrophie Blanche: No Ecchymosis: No Cyanosis: No Erythema: No Ecchymosis: No Hemosiderin Staining: No Erythema: No Mottled: No Hemosiderin Staining: No Pallor: No Pallor: No Rubor: No Rubor: No Temperature: No Abnormality No Abnormality N/A Tenderness on Yes Yes N/A Palpation: Wound Preparation: Ulcer Cleansing: Ulcer Cleansing: Other: N/A Rinsed/Irrigated with surg scrub and water Saline Topical Anesthetic Topical Anesthetic Applied: Other: lidocaine Applied: Other: lidocaine 4% 4% Treatment Notes Electronic Signature(s) Signed: 05/28/2016 5:54:02 PM By: Linton Ham MD Entered By: Linton Ham on 05/28/2016 16:44:28 [name] -------------------------------------------------------------------------------- [place] Details Patient Name: [name] Date of Service: 05/28/2016 3:30 PM Medical Record Number: [id number] Patient Account Number: [id number] Date of Birth/Sex: 12/06/1950 (66 y.o. Female) Treating RN: Montey Hora Primary Care Graysin Luczynski: LADA, [place] Other Clinician: Referring Donis Pinder: LADA, [place] Treating Artemisia Auvil/Extender: [name] Dine in Treatment: 27 Active Inactive ` Abuse / Safety / Falls / Self Care Management Nursing Diagnoses: Impaired physical mobility Potential for falls Goals: Patient will remain injury free Date Initiated: 11/20/2015 Target Resolution Date: 05/28/2016 Goal Status: Active Interventions: Assess fall risk on admission and as  needed Notes: ` Nutrition Nursing Diagnoses: Imbalanced nutrition Goals: Patient/caregiver agrees to and verbalizes understanding of need to use nutritional supplements and/or vitamins as prescribed Date Initiated: 11/20/2015 Target Resolution Date: 05/28/2016 Goal Status: Active Interventions: Provide education on nutrition Treatment Activities: Education provided on Nutrition : 03/19/2016 Notes: [name], [name] Orientation to the Wound Care Program Nursing Diagnoses: Knowledge deficit related to the wound healing center program Goals: Patient/caregiver will verbalize understanding of the [place] Date Initiated: 11/20/2015 Target Resolution Date: 05/28/2016 Goal Status: Active Interventions: Provide education on orientation to the wound center Notes: ` Wound/Skin Impairment Nursing Diagnoses: Impaired tissue integrity Goals: Patient will demonstrate a reduced rate of smoking or cessation of smoking Date Initiated: 11/20/2015 Target Resolution Date: 06/18/2016 Goal Status: Active Ulcer/skin breakdown will heal within 14 weeks Date Initiated: 11/20/2015 Target Resolution Date: 07/16/2016 Goal Status: Active Interventions: Assess patient/caregiver ability to  obtain necessary supplies Provide education on smoking Notes: Electronic Signature(s) Signed: 05/28/2016 5:15:42 PM By: Montey Hora Entered By: Montey Hora on 05/28/2016 16:24:08 [name] ([id number]) -------------------------------------------------------------------------------- Pain Assessment Details Patient Name: [name] Date of Service: 05/28/2016 3:30 PM Medical Record Number: [id number] Patient Account Number: [id number] Date of Birth/Sex: December 07, 1950 (66 y.o. Female) Treating RN: Montey Hora Primary Care [name]: LADA, [place] Other Clinician: Referring Tasha Diaz: LADA, [place] Treating Brealynn Contino/Extender: [name] Dine in Treatment:  27 Active Problems Location of Pain Severity and Description of Pain Patient Has Paino Yes Site Locations Pain Location: Generalized Pain, Pain in Ulcers With Dressing Change: Yes Duration of the Pain. Constant / Intermittento Constant Pain Management and Medication Current Pain Management: Notes Topical or injectable lidocaine is offered to patient for acute pain when surgical debridement is performed. If needed, Patient is instructed to use over the counter pain medication for the following 24-48 hours after debridement. Wound care MDs do not prescribed pain medications. Patient has chronic pain or uncontrolled pain. Patient has been instructed to make an appointment with their Primary Care Physician for pain management. Electronic Signature(s) Signed: 05/28/2016 5:15:42 PM By: Montey Hora Entered By: Montey Hora on 05/28/2016 15:57:34 [name] ([id number]) -------------------------------------------------------------------------------- Patient/Caregiver Education Details Patient Name: [name] Date of Service: 05/28/2016 3:30 PM Medical Record Patient Account Number: [id number] Number: Treating RN: Montey Hora 07/20/1950 (65 y.o. Other Clinician: Date of Birth/Gender: Female) Treating [name], [place] Primary Care Physician: LADA, MELINDA Physician/Extender: G Referring Physician: LADA, MELINDA Weeks in Treatment: 15 Education Assessment Education Provided To: Patient Education Topics Provided Venous: Handouts: Other: wear compression daily Methods: Explain/Verbal Responses: State content correctly Electronic Signature(s) Signed: 05/28/2016 5:15:42 PM By: Montey Hora Entered By: Montey Hora on 05/28/2016 17:02:01 [name] ([id number]) -------------------------------------------------------------------------------- Wound Assessment Details Patient Name: [name] Date of Service: 05/28/2016 3:30 PM Medical Record  Number: [id number] Patient Account Number: [id number] Date of Birth/Sex: 07-12-1950 (66 y.o. Female) Treating RN: Montey Hora Primary Care Alora Gorey: LADA, [place] Other Clinician: Referring Armistead Sult: LADA, MELINDA Treating Laiden Milles/Extender: Ricard Dillon Weeks in Treatment: 27 Wound Status Wound Number: 10 Primary Trauma, Other Etiology: Wound Location: Left Metatarsal head fifth - Plantar Wound Open Status: Wounding Event: Trauma Comorbid Cataracts, Chronic Obstructive Date Acquired: 05/26/2016 History: Pulmonary Disease (COPD), Sleep Weeks Of Treatment: 0 Apnea, Hypertension, Osteoarthritis, Clustered Wound: No Neuropathy Photos Wound Measurements Length: (cm) 0.5 Width: (cm) 1 Depth: (cm) 0.1 Area: (cm) 0.393 Volume: (cm) 0.039 % Reduction in Area: 0% % Reduction in Volume: 0% Epithelialization: Medium (34-66%) Tunneling: No Undermining: No Wound Description Classification: Partial Thickness Foul Odor Afte Wound Margin: Flat and Intact Slough/Fibrino Exudate Amount: Large Exudate Type: Serous Exudate Color: amber r Cleansing: No No Wound Bed Granulation Amount: Large (67-100%) Exposed Structure Granulation Quality: Pink Fascia Exposed: No Necrotic Amount: None Present (0%) Fat Layer (Subcutaneous Tissue) Exposed: No Tendon Exposed: No [name], [name]. ([id number]) Muscle Exposed: No Joint Exposed: No Bone Exposed: No Limited to Skin Breakdown Periwound Skin Texture Texture Color No Abnormalities Noted: No No Abnormalities Noted: No Callus: No Atrophie Blanche: No Crepitus: No Cyanosis: No Excoriation: No Ecchymosis: No Induration: No Erythema: No Rash: No Hemosiderin Staining: No Scarring: No Mottled: No Pallor: No Moisture Rubor: No No Abnormalities Noted: No Dry / Scaly: No Temperature / Pain Maceration: No Temperature: No Abnormality Tenderness on Palpation: Yes Wound Preparation Ulcer Cleansing: Rinsed/Irrigated with  Saline Topical Anesthetic Applied: Other: lidocaine 4%, Treatment Notes Wound #10 (Left, Plantar Metatarsal  head fifth) 1. Cleansed with: Cleanse wound with antibacterial soap and water 5. Secondary Dressing Applied Bordered Foam Dressing 7. Secured with Patient to wear own compression stockings Electronic Signature(s) Signed: 05/28/2016 5:15:42 PM By: Montey Hora Entered By: Montey Hora on 05/28/2016 16:22:55 [name] ([id number]) -------------------------------------------------------------------------------- Wound Assessment Details Patient Name: [name] Date of Service: 05/28/2016 3:30 PM Medical Record Number: [id number] Patient Account Number: [id number] Date of Birth/Sex: 1950/11/09 (66 y.o. Female) Treating RN: Montey Hora Primary Care Shella Lahman: LADA, [place] Other Clinician: Referring Osvaldo Lamping: LADA, MELINDA Treating Hortensia Duffin/Extender: Ricard Dillon Weeks in Treatment: 27 Wound Status Wound Number: 9 Primary Lymphedema Etiology: Wound Location: Left Lower Leg - Lateral Wound Open Wounding Event: Shear/Friction Status: Date Acquired: 05/09/2016 Comorbid Cataracts, Chronic Obstructive Weeks Of Treatment: 2 History: Pulmonary Disease (COPD), Sleep Clustered Wound: No Apnea, Hypertension, Osteoarthritis, Neuropathy Photos Wound Measurements Length: (cm) 0.1 Width: (cm) 0.1 Depth: (cm) 0.1 Area: (cm) 0.008 Volume: (cm) 0.001 % Reduction in Area: 99% % Reduction in Volume: 98.7% Epithelialization: Large (67-100%) Tunneling: No Undermining: No Wound Description Classification: Partial Thickness Wound Margin: Distinct, outline attached Exudate Amount: None Present Foul Odor After Cleansing: No Wound Bed Granulation Amount: Large (67-100%) Exposed Structure Granulation Quality: Pale Fascia Exposed: No Necrotic Amount: None Present (0%) Fat Layer (Subcutaneous Tissue) Exposed: No Tendon Exposed: No Muscle Exposed: No Joint  Exposed: No [name], [name]. ([id number]) Bone Exposed: No Limited to Skin Breakdown Periwound Skin Texture Texture Color No Abnormalities Noted: No No Abnormalities Noted: No Callus: No Atrophie Blanche: No Crepitus: No Cyanosis: No Excoriation: No Ecchymosis: No Induration: No Erythema: No Rash: No Hemosiderin Staining: No Scarring: No Mottled: Yes Pallor: No Moisture Rubor: No No Abnormalities Noted: No Dry / Scaly: No Temperature / Pain Maceration: No Temperature: No Abnormality Tenderness on Palpation: Yes Wound Preparation Ulcer Cleansing: Other: surg scrub and water, Topical Anesthetic Applied: Other: lidocaine 4%, Treatment Notes Wound #9 (Left, Lateral Lower Leg) 1. Cleansed with: Cleanse wound with antibacterial soap and water 5. Secondary Dressing Applied Bordered Foam Dressing 7. Secured with Patient to wear own compression stockings Electronic Signature(s) Signed: 05/28/2016 5:15:42 PM By: Montey Hora Entered By: Montey Hora on 05/28/2016 16:23:30 [name] ([id number]) -------------------------------------------------------------------------------- Vitals Details Patient Name: [name] Date of Service: 05/28/2016 3:30 PM Medical Record Number: [id number] Patient Account Number: [id number] Date of Birth/Sex: Dec 05, 1950 (66 y.o. Female) Treating RN: Montey Hora Primary Care Rexine Gowens: LADA, [place] Other Clinician: Referring Elston Aldape: LADA, [place] Treating Bhavesh Vazquez/Extender: Ricard Dillon Weeks in Treatment: 27 Vital Signs Time Taken: 15:57 Temperature (F): 98.0 Height (in): 63 Pulse (bpm): 84 Weight (lbs): 270 Respiratory Rate (breaths/min): 20 Body Mass Index (BMI): 47.8 Blood Pressure (mmHg): 163/67 Reference Range: 80 - 120 mg / dl Electronic Signature(s) Signed: 05/28/2016 5:15:42 PM By: Montey Hora Entered By: Montey Hora on 05/28/2016 15:58:10

## 2016-05-29 NOTE — ED Triage Notes (Signed)
Pt arrived by EMS after falling while rising out of her chair. Pt called 911 for assistance to get up off of the floor. EMS felt it was in the Pt's best interest to come to the ED. Pt states she has had multiple falls and that today her left leg is weaker and her right leg could not support her.

## 2016-05-29 NOTE — Progress Notes (Signed)
ARTHENA, BOETCHER ([id number]) Visit Report for 05/28/2016 Chief Complaint Document Details Patient Name: [name] Date of Service: 05/28/2016 3:30 PM Medical Record Patient Account Number: [id number] Number: Treating RN: [name] 14-Apr-1951 (66 y.o. Other Clinician: Date of Birth/Sex: Female) Treating ROBSON, [name] Primary Care Provider: LADA, [name] Provider/Extender: G Referring Provider: LADA, [name] Weeks in Treatment: 27 Information Obtained from: Patient Chief Complaint returns for evaluation of bilateral lower extremity ulcerations Electronic Signature(s) Signed: 05/28/2016 5:54:02 PM By: [name] Ham MD Entered By: [name] on 05/28/2016 16:46:06 [name] ([id number]) -------------------------------------------------------------------------------- HPI Details Patient Name: [name] Date of Service: 05/28/2016 3:30 PM Medical Record Patient Account Number: [id number] Number: Treating RN: [name] [birth year] (66 y.o. Other Clinician: Date of Birth/Sex: Female) Treating ROBSON, [name] Primary Care Provider: LADA, [name] Provider/Extender: G Referring Provider: LADA, [name] Weeks in Treatment: 58 History of Present Illness Location: left and right lower extremity and dorsum of the right foot Quality: Patient reports experiencing a sharp pain to affected area(s). Severity: Patient rates the pain to be a 8 out of 10 especially with palpation Duration: Patient has had the wound for > 6 months prior to seeking treatment at the wound center Timing: Pain in wound is Intermittent in severity but persistent Context: The wound appeared gradually over time Modifying Factors: Other treatment(s) tried include: symptomatic treatment as advised by her PCP Associated Signs and Symptoms: Patient reports having increase swelling iin her bilateral lower extremities HPI Description: 66 year old with a past medical history  significant for MS, urinary incontinence, and obesity. She has been seen in the wound clinic before for lower extremity ulcerations treated with compression therapy. she is also known to have hypertension, peripheral vascular disease, COPD, obstructive sleep apnea, lumbar radiculopathy, kyphoscoliosis, urinary issues and tobacco abuse. Smokes a packet of cigarettes a day was recently seen at the [facility] for swelling of her legs and feet with a ulceration on the dorsum of the right foot which has been there for about 6 months. she was recently in the ER about a month ago where she was seen for shortness of breath and swelling of the legs and a chest x-ray was within normal limits had an increase in her BNP and was given Lasix and put on doxycycline for a mild cellulitis and possible UTI. Wounds aresmaller today 11/13/15. Debrided and will continue Santyl. 12/21/2015 -- she was admitted to the hospital overnight on 12/18/2015 and diagnosed with urinary retention and cellulitis of the left lower leg. is past to take clindamycin and use Santyl for her wounds. 01/15/2016 -- come back to see [place] for almost a month and continues to be noncompliant with her dressings 01/30/16 patient presents today for a follow-up visit concerning her ongoing bilateral lower extremity wounds. We have not seen her for the past 2 weeks although we are supposed to be seen her for weekly visits. She currently has a Foley catheter. She tells me that the wounds are intensely painful especially with pressure and palpation at this point in time. Fortunately she is having no interval signs or symptoms of systemic infection but unfortunately the wounds have not improved dramatically since we last saw her. She does have home health coming to take care of her as well. She is currently not in any compression wraps. 02/06/16 ON evaluation today patient continues to experience discomfort regarding her bilateral  lower extremity ulcers. She has continued to tolerate the dressing changes at this point in time and continues  to have a Foley catheter as well. Fortunately she is back this week in the past she has been somewhat noncompliant with follow-ups I'm glad to see her today. She tells me that her pain level varies but can be as high as a 7 out of 10 with manipulation of the wound. She tells me that she used to be on oxycodone which [name], [name]. ([id number]) was managed by the pain clinic although she is no longer on that as she tells me that she was actually smoking marijuana at the time and when this was found out they discontinued her pain medication. She no longer is taking anything pain medication wise and she also does not smoke cigarettes nor marijuana at this point. 02/12/16; this is a patient I don't believe I have previously seen. She has multiple sclerosis. She has 4 punched-out areas on the anterior lateral left leg 2 areas on the right these are all in the same condition. Reasonably small [dime size] wounds each was some depth. Surface of these wounds does not look particularly healthy as there is adherent slough. There is no evidence of surrounding infection or inflammation. ABIs in this clinic were 0.87 on the right and 0.81 on the left. She is listed as having PAD and is a smoker. Not a diabetic 02/20/16 patient I gave antibiotics to last week for erythema around both wound sites on the left lateral leg and right lateral leg. This appears to be a lot better. One of the areas on the left leg is healed however she still has 3 punched-out open areas on the left lateral calf and one on the right lateral calf and one on the dorsal foot. She is an ex-smoker quitting 1 month ago. ABIs in this clinic were 0.87 on the right 0.81 on the left 03/12/16; this is a patient I really don't have a good sense of. It would appear for the first 5 or 6 weeks of her stay in this clinic she was cared for by  Dr. Con Memos. She appeared on my schedule in mid October and I saw her twice. She has not been here however in 3 weeks. She has advanced Wound Care at home where she lives with her husband. She has multiple sclerosis. I saw her the first time she had 4 punched-out small wounds on the anterior lateral left leg it appears that she is now down to 2. She also had wounds on the lateral and medial aspect of her right leg which were also small and punched out. The only one that remains is on the right lateral. Because of the nature of her wound I went ahead and ordered formal arterial studies this showed an ABI of 1 on the right and one on the left. TBI of 1.4 on the right and 0.79 on the left. She had normal waveforms. She was in the emergency room on 02/28/16; there is a noted edema of her left leg after a fall. She had a duplex ultrasound that showed no evidence for an acute DVT from the left groin to the popliteal fossa. The study was limited in the calf veins due to edema. Also noticeable that she had a left inguinal enlarged lymph node up to 6.2 cm. She also had a left knee x-ray that showed no acute findings and a right foot x-ray that showed soft tissue swelling but no radiographic evidence of osteomyelitis. The patient does stop smoking 03-19-16 Ms Holton presents today for evaluation of her bilateral lower  extremity ulcerations. She states that she has not smoked in several weeks. She denies any pain or discomfort to bilateral lower extremities, tolerating compression therapy as ordered. 03/26/16; I have not seen this patient in 2 weeks however she has done very well with improvements in the areas in question. After we obtain normal arterial studies, increasing the 4-layer compression really seems to look done a nice job here. She has one open area on the left lateral leg and one on the medial right leg. These have filled in nicely and are now superficial wounds that showed epithelialized. I note  the left inguinal lymph node at 6.2 cm. I may need to refer this back to the patient's primary doctor. 04/03/16; patient has 2 remaining wounds 1 on the left lateral leg and one on the right medial leg. Both of filled in nicely since we are able to increase her from a 3 layer to a 4 layer compression. I am also following up on the lymph node notable on her left leg DVT rule out in November 04/10/16; area on the right lateral leg is healed. The area on the left leg is still open but looks considerably better with healthy granulation and less wound area. I12/20/17; last week we healed the patient doubt with regards to the wound on the right leg to her on compression stocking 20-30 mmHg as it turns out I don't think she had a stocking, predictably therefore she has reopened. The left leg as well as the wound on the lateral aspect. Been generally small line 04/24/16; we healed out her right leg 2 weeks ago to her own 20-30 mm compression stockings although as it turns out she didn't have these and did not purchase some apparently because of financial issues. The left leg wound is on the lateral aspect. Both of these wounds are just about healed. 05/01/16; her wounds on the left leg are healed out today. The area on the right leg is also healed. Vitamin diligent effort of our staff we have not been able to get any form of compression stocking to this patient. The [name], [name] ([id number]) orders for juxtalite's were given to home health this never materialized. We ordered compression stockings I don't think she was able to afford these. We had a discussion today with both the patient and her husband without these, will accumulate edema and the wounds will simply reopen again. 05/14/16 READMISSION this is a patient we healed out 2 weeks ago. As bilateral lower extremity venous wounds probably some degree of lymphedema. At the beginning in November I did a duplex ultrasound of her left leg that  was negative for DVT but did show a lymph node in the left inguinal area presumably reactive. We've been using compression to her lower extremities eventually healed all her wounds on her bilateral calves but we were not able to get any form of compression stockings for her in spite of intense efforts of our staff. She returns today with reopening on the left leg did not the right she has several superficial areas anteriorly but an actual frank ulcer on the lateral left calf. This is about the size of a dime or smaller. She does not really complain of pain. The patient has a history of PAD however arterial studies we did in November were normal. Her ABIs and Doppler waveforms were both within the normal limits. The patient has a history of MS. In discussing things with her today it would appear that the opening  was noted 2 weeks ago according to the patient. We gave her Tubigrip stockings when she left the clinic 05/21/16; she has not yet had the duplex ultrasound of her thigh, this is booked for Thursday. The wound on the left lateral lower leg is improved 05/28/16; her duplex ultrasound of the left leg specifically her left thigh did not show a DVT however did show hypoechogenic enlarged left inguinal lymph nodes up to 6.9 x 3.7 x 6.8. This also showed in the one in November however maximal diameter was 6.2, at that point I thought these were reactive secondary to cellulitis however there is obviously a differential here. Her wound on the left lower leg is closed. She has a superficial open area on the base of her left fifth metatarsal head on the plantar foot. It looks as though she has some wrap injuries on the anterior leg Electronic Signature(s) Signed: 05/28/2016 5:54:02 PM By: [name] Ham MD Entered By: [name] on 05/28/2016 16:48:00 [name] ([id number]) -------------------------------------------------------------------------------- Physical Exam Details Patient Name:  [name] Date of Service: 05/28/2016 3:30 PM Medical Record Patient Account Number: [id number] Number: Treating RN: [name] 01-12-1951 (65 y.o. Other Clinician: Date of Birth/Sex: Female) Treating ROBSON, [name] Primary Care Provider: LADA, [name] Provider/Extender: G Referring Provider: LADA, [name] Weeks in Treatment: 27 Constitutional Sitting or standing Blood Pressure is within target range for patient.. Pulse regular and within target range for patient.Marland Kitchen Respirations regular, non-labored and within target range.. Temperature is normal and within the target range for the patient.. Patient's appearance is neat and clean. Appears in no acute distress. Well nourished and well developed.. Eyes Conjunctivae clear. No discharge.. Cardiovascular Markedly enlarged left thigh which looks like some combination of venous and lymphedema. She has some pitting some warmth but no tenderness.. Gastrointestinal (GI) Abdomen is soft and non-distended without masses or tenderness. Bowel sounds active in all quadrants.. No liver or spleen enlargement or tenderness.. Genitourinary (GU) Foley catheter secondary to MS I believe. Lymphatic None palpable in the inguinal axilla or cervical areas. Notes Wound exam; the area on her left lateral leg is closed. She has wrap injuries anteriorly but they looks superficial. She has a superficial wound over her left fifth metatarsal head Electronic Signature(s) Signed: 05/28/2016 5:54:02 PM By: [name] Ham MD Entered By: [name] on 05/28/2016 16:50:19 [name] ([id number]) -------------------------------------------------------------------------------- Physician Orders Details Patient Name: [name] Date of Service: 05/28/2016 3:30 PM Medical Record Patient Account Number: [id number] Number: Treating RN: [name] 30-Apr-1950 (65 y.o. Other Clinician: Date of Birth/Sex: Female) Treating  ROBSON, [name] Primary Care Provider: LADA, [name] Provider/Extender: G Referring Provider: LADA, [name] Weeks in Treatment: 23 Verbal / Phone Orders: Yes Clinician: Dorthy, Joanna Read Back and Verified: Yes Diagnosis Coding Wound Cleansing Wound #10 Left,Plantar Metatarsal head fifth o Clean wound with Normal Saline. Wound #9 Left,Lateral Lower Leg o Clean wound with Normal Saline. Anesthetic Wound #10 Left,Plantar Metatarsal head fifth o Topical Lidocaine 4% cream applied to wound bed prior to debridement Wound #9 Left,Lateral Lower Leg o Topical Lidocaine 4% cream applied to wound bed prior to debridement Secondary Dressing Wound #10 Left,Plantar Metatarsal head fifth o Boardered Foam Dressing Wound #9 Left,Lateral Lower Leg o Boardered Foam Dressing Dressing Change Frequency Wound #10 Left,Plantar Metatarsal head fifth o Three times weekly Wound #9 Left,Lateral Lower Leg o Three times weekly Follow-up Appointments Wound #10 Left,Plantar Metatarsal head fifth o Return Appointment in 1 week. Wound #9 Left,Lateral Lower Leg o Return  Appointment in 1 week. [name], [name] Edema Control Wound #10 Left,Plantar Metatarsal head fifth o Patient to wear own compression stockings Wound #9 Left,Lateral Lower Leg o Patient to wear own compression stockings Home Health Wound #10 Left,Plantar Metatarsal head fifth o [place] Nurse may visit PRN to address patientos wound care needs. o FACE TO FACE ENCOUNTER: MEDICARE and MEDICAID PATIENTS: I certify that this patient is under my care and that I had a face-to-face encounter that meets the physician face-to-face encounter requirements with this patient on this date. The encounter with the patient was in whole or in part for the following MEDICAL CONDITION: (primary reason for [place]) MEDICAL NECESSITY: I certify, that based on my findings,  NURSING services are a medically necessary home health service. HOME BOUND STATUS: I certify that my clinical findings support that this patient is homebound (i.e., Due to illness or injury, pt requires aid of supportive devices such as crutches, cane, wheelchairs, walkers, the use of special transportation or the assistance of another person to leave their place of residence. There is a normal inability to leave the home and doing so requires considerable and taxing effort. Other absences are for medical reasons / religious services and are infrequent or of short duration when for other reasons). o If current dressing causes regression in wound condition, may D/C ordered dressing product/s and apply Normal Saline Moist Dressing daily until next [place] / Other MD appointment. [place] of regression in wound condition at [phone]. o Please direct any NON-WOUND related issues/requests for orders to patient's Primary Care Physician Wound #9 [place] Nurse may visit PRN to address patientos wound care needs. o FACE TO FACE ENCOUNTER: MEDICARE and MEDICAID PATIENTS: I certify that this patient is under my care and that I had a face-to-face encounter that meets the physician face-to-face encounter requirements with this patient on this date. The encounter with the patient was in whole or in part for the following MEDICAL CONDITION: (primary reason for [place]) MEDICAL NECESSITY: I certify, that based on my findings, NURSING services are a medically necessary home health service. HOME BOUND STATUS: I certify that my clinical findings support that this patient is homebound (i.e., Due to illness or injury, pt requires aid of supportive devices such as crutches, cane, wheelchairs, walkers, the use of special transportation or the assistance of another person to leave their place of  residence. There is a normal inability to leave the home and doing so requires considerable and taxing effort. Other absences are for medical reasons / religious services and are infrequent or of short duration when for other reasons). o If current dressing causes regression in wound condition, may D/C ordered dressing product/s and apply Normal Saline Moist Dressing daily until next [place] / Other MD appointment. [place] of regression in wound condition at [phone]. [name], [name] o Please direct any NON-WOUND related issues/requests for orders to patient's Primary Care Physician Electronic Signature(s) Signed: 05/28/2016 5:15:42 PM By: [name] Signed: 05/28/2016 5:54:02 PM By: [name] Ham MD Entered By: [name] on 05/28/2016 16:29:50 [name] -------------------------------------------------------------------------------- Problem List Details Patient Name: [name] Date of Service: 05/28/2016 3:30 PM Medical Record Patient Account Number: [id number] Number: Treating RN: [name] January 19, 1951 (65 y.o. 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 11/20/2015 Yes exposed L97.512 Non-pressure chronic ulcer of other part of right foot with 11/20/2015 Yes fat layer exposed G35 Multiple sclerosis 11/20/2015 Yes E66.01 Morbid (severe) obesity due to excess calories 11/20/2015 Yes F17.218 Nicotine dependence, cigarettes, with other nicotine- 11/20/2015 Yes induced disorders L04.3 Acute lymphadenitis of lower limb 04/03/2016 Yes Inactive Problems ICD-10 Code Description Active Date Inactive Date L97.222  Non-pressure chronic ulcer of left calf with fat layer 11/20/2015 11/20/2015 exposed Resolved Problems ANNALIA, LEZON ([id number]) Electronic Signature(s) Signed: 05/28/2016 5:54:02 PM By: [name] Ham MD Entered By: [name] on 05/28/2016 16:40:25 [name] -------------------------------------------------------------------------------- Progress Note Details Patient Name: [name] Date of Service: 05/28/2016 3:30 PM Medical Record Patient Account Number: [id number] Number: Treating RN: [name] 08-26-1950 (65 y.o. Other Clinician: Date of Birth/Sex: Female) Treating ROBSON, [name] Primary Care Provider: LADA, [name] Provider/Extender: G Referring Provider: LADA, [name] Weeks in Treatment: 70 Subjective Chief Complaint Information obtained from Patient returns for evaluation of bilateral lower extremity ulcerations History of Present Illness (HPI) The following HPI elements were documented for the patient's wound: Location: left and right lower extremity and dorsum of the right foot Quality: Patient reports experiencing a sharp pain to affected area(s). Severity: Patient rates the pain to be a 8 out of 10 especially with palpation Duration: Patient has had the wound for > 6 months prior to seeking treatment at the wound center Timing: Pain in wound is Intermittent in severity but persistent Context: The wound appeared gradually over time Modifying Factors: Other treatment(s) tried include: symptomatic treatment as advised by her PCP Associated Signs and Symptoms: Patient reports having increase swelling iin her bilateral lower extremities 66 year old with a past medical history significant for MS, urinary incontinence, and obesity. She has been seen in the wound clinic before for lower extremity ulcerations treated with compression therapy. she is also known to have hypertension, peripheral vascular disease, COPD, obstructive sleep  apnea, lumbar radiculopathy, kyphoscoliosis, urinary issues and tobacco abuse. Smokes a packet of cigarettes a day was recently seen at the [facility] for swelling of her legs and feet with a ulceration on the dorsum of the right foot which has been there for about 6 months. she was recently in the ER about a month ago where she was seen for shortness of breath and swelling of the legs and a chest x-ray was within normal limits had an increase in her BNP and was given Lasix and put on doxycycline for a mild cellulitis and possible UTI. Wounds aresmaller today 11/13/15. Debrided and will continue Santyl. 12/21/2015 -- she was admitted to the hospital overnight on 12/18/2015 and diagnosed with urinary retention and cellulitis of the left lower leg. is past to take clindamycin and use Santyl for her wounds. 01/15/2016 -- come back to see [place] for almost a month and continues to be noncompliant with her dressings 01/30/16 patient presents today for a follow-up visit concerning her ongoing bilateral lower extremity wounds. We have not seen her for the past 2 weeks although we are supposed to be seen her for weekly visits. She currently has a Foley catheter. She tells me that the wounds are intensely painful especially with pressure and palpation at this point in time. Fortunately she is having no interval signs or symptoms of systemic infection but unfortunately the wounds have  not improved dramatically since we last saw her. She does have home health coming to take care of her as well. She is currently not in any compression wraps. [name], [name] 02/06/16 ON evaluation today patient continues to experience discomfort regarding her bilateral lower extremity ulcers. She has continued to tolerate the dressing changes at this point in time and continues to have a Foley catheter as well. Fortunately she is back this week in the past she has been somewhat noncompliant with  follow-ups I'm glad to see her today. She tells me that her pain level varies but can be as high as a 7 out of 10 with manipulation of the wound. She tells me that she used to be on oxycodone which was managed by the pain clinic although she is no longer on that as she tells me that she was actually smoking marijuana at the time and when this was found out they discontinued her pain medication. She no longer is taking anything pain medication wise and she also does not smoke cigarettes nor marijuana at this point. 02/12/16; this is a patient I don't believe I have previously seen. She has multiple sclerosis. She has 4 punched-out areas on the anterior lateral left leg 2 areas on the right these are all in the same condition. Reasonably small [dime size] wounds each was some depth. Surface of these wounds does not look particularly healthy as there is adherent slough. There is no evidence of surrounding infection or inflammation. ABIs in this clinic were 0.87 on the right and 0.81 on the left. She is listed as having PAD and is a smoker. Not a diabetic 02/20/16 patient I gave antibiotics to last week for erythema around both wound sites on the left lateral leg and right lateral leg. This appears to be a lot better. One of the areas on the left leg is healed however she still has 3 punched-out open areas on the left lateral calf and one on the right lateral calf and one on the dorsal foot. She is an ex-smoker quitting 1 month ago. ABIs in this clinic were 0.87 on the right 0.81 on the left 03/12/16; this is a patient I really don't have a good sense of. It would appear for the first 5 or 6 weeks of her stay in this clinic she was cared for by Dr. Con Memos. She appeared on my schedule in mid October and I saw her twice. She has not been here however in 3 weeks. She has advanced Wound Care at home where she lives with her husband. She has multiple sclerosis. I saw her the first time she had 4  punched-out small wounds on the anterior lateral left leg it appears that she is now down to 2. She also had wounds on the lateral and medial aspect of her right leg which were also small and punched out. The only one that remains is on the right lateral. Because of the nature of her wound I went ahead and ordered formal arterial studies this showed an ABI of 1 on the right and one on the left. TBI of 1.4 on the right and 0.79 on the left. She had normal waveforms. She was in the emergency room on 02/28/16; there is a noted edema of her left leg after a fall. She had a duplex ultrasound that showed no evidence for an acute DVT from the left groin to the popliteal fossa. The study was limited in the calf veins due to  edema. Also noticeable that she had a left inguinal enlarged lymph node up to 6.2 cm. She also had a left knee x-ray that showed no acute findings and a right foot x-ray that showed soft tissue swelling but no radiographic evidence of osteomyelitis. The patient does stop smoking 03-19-16 Ms Parlin presents today for evaluation of her bilateral lower extremity ulcerations. She states that she has not smoked in several weeks. She denies any pain or discomfort to bilateral lower extremities, tolerating compression therapy as ordered. 03/26/16; I have not seen this patient in 2 weeks however she has done very well with improvements in the areas in question. After we obtain normal arterial studies, increasing the 4-layer compression really seems to look done a nice job here. She has one open area on the left lateral leg and one on the medial right leg. These have filled in nicely and are now superficial wounds that showed epithelialized. I note the left inguinal lymph node at 6.2 cm. I may need to refer this back to the patient's primary doctor. 04/03/16; patient has 2 remaining wounds 1 on the left lateral leg and one on the right medial leg. Both of filled in nicely since we are able to  increase her from a 3 layer to a 4 layer compression. I am also following up on the lymph node notable on her left leg DVT rule out in November 04/10/16; area on the right lateral leg is healed. The area on the left leg is still open but looks considerably better with healthy granulation and less wound area. I12/20/17; last week we healed the patient doubt with regards to the wound on the right leg to her on compression stocking 20-30 mmHg as it turns out I don't think she had a stocking, predictably therefore she Yazdi, [name]. ([id number]) has reopened. The left leg as well as the wound on the lateral aspect. Been generally small line 04/24/16; we healed out her right leg 2 weeks ago to her own 20-30 mm compression stockings although as it turns out she didn't have these and did not purchase some apparently because of financial issues. The left leg wound is on the lateral aspect. Both of these wounds are just about healed. 05/01/16; her wounds on the left leg are healed out today. The area on the right leg is also healed. Vitamin diligent effort of our staff we have not been able to get any form of compression stocking to this patient. The orders for juxtalite's were given to home health this never materialized. We ordered compression stockings I don't think she was able to afford these. We had a discussion today with both the patient and her husband without these, will accumulate edema and the wounds will simply reopen again. 05/14/16 READMISSION this is a patient we healed out 2 weeks ago. As bilateral lower extremity venous wounds probably some degree of lymphedema. At the beginning in November I did a duplex ultrasound of her left leg that was negative for DVT but did show a lymph node in the left inguinal area presumably reactive. We've been using compression to her lower extremities eventually healed all her wounds on her bilateral calves but we were not able to get any form of compression  stockings for her in spite of intense efforts of our staff. She returns today with reopening on the left leg did not the right she has several superficial areas anteriorly but an actual frank ulcer on the lateral left calf. This is about  the size of a dime or smaller. She does not really complain of pain. The patient has a history of PAD however arterial studies we did in November were normal. Her ABIs and Doppler waveforms were both within the normal limits. The patient has a history of MS. In discussing things with her today it would appear that the opening was noted 2 weeks ago according to the patient. We gave her Tubigrip stockings when she left the clinic 05/21/16; she has not yet had the duplex ultrasound of her thigh, this is booked for Thursday. The wound on the left lateral lower leg is improved 05/28/16; her duplex ultrasound of the left leg specifically her left thigh did not show a DVT however did show hypoechogenic enlarged left inguinal lymph nodes up to 6.9 x 3.7 x 6.8. This also showed in the one in November however maximal diameter was 6.2, at that point I thought these were reactive secondary to cellulitis however there is obviously a differential here. Her wound on the left lower leg is closed. She has a superficial open area on the base of her left fifth metatarsal head on the plantar foot. It looks as though she has some wrap injuries on the anterior leg Objective Constitutional Sitting or standing Blood Pressure is within target range for patient.. Pulse regular and within target range for patient.Marland Kitchen Respirations regular, non-labored and within target range.. Temperature is normal and within the target range for the patient.. Patient's appearance is neat and clean. Appears in no acute distress. Well nourished and well developed.. Vitals Time Taken: 3:57 PM, Height: 63 in, Weight: 270 lbs, BMI: 47.8, Temperature: 98.0 F, Pulse: 84 bpm, Respiratory Rate: 20 breaths/min,  Blood Pressure: 163/67 mmHg. Eyes [name], [name]. ([id number]) Conjunctivae clear. No discharge.. Cardiovascular Markedly enlarged left thigh which looks like some combination of venous and lymphedema. She has some pitting some warmth but no tenderness.. Gastrointestinal (GI) Abdomen is soft and non-distended without masses or tenderness. Bowel sounds active in all quadrants.. No liver or spleen enlargement or tenderness.. Genitourinary (GU) Foley catheter secondary to MS I believe. Lymphatic None palpable in the inguinal axilla or cervical areas. General Notes: Wound exam; the area on her left lateral leg is closed. She has wrap injuries anteriorly but they looks superficial. She has a superficial wound over her left fifth metatarsal head Integumentary (Hair, Skin) Wound #10 status is Open. Original cause of wound was Trauma. The wound is located on the Left,Plantar Metatarsal head fifth. The wound measures 0.5cm length x 1cm width x 0.1cm depth; 0.393cm^2 area and 0.039cm^3 volume. The wound is limited to skin breakdown. There is no tunneling or undermining noted. There is a large amount of serous drainage noted. The wound margin is flat and intact. There is large (67- 100%) pink granulation within the wound bed. There is no necrotic tissue within the wound bed. The periwound skin appearance did not exhibit: Callus, Crepitus, Excoriation, Induration, Rash, Scarring, Dry/Scaly, Maceration, Atrophie Blanche, Cyanosis, Ecchymosis, Hemosiderin Staining, Mottled, Pallor, Rubor, Erythema. Periwound temperature was noted as No Abnormality. The periwound has tenderness on palpation. Wound #9 status is Open. Original cause of wound was Shear/Friction. The wound is located on the Left,Lateral Lower Leg. The wound measures 0.1cm length x 0.1cm width x 0.1cm depth; 0.008cm^2 area and 0.001cm^3 volume. The wound is limited to skin breakdown. There is no tunneling or undermining noted. There is a  none present amount of drainage noted. The wound margin is distinct with the outline attached to  the wound base. There is large (67-100%) pale granulation within the wound bed. There is no necrotic tissue within the wound bed. The periwound skin appearance exhibited: Mottled. The periwound skin appearance did not exhibit: Callus, Crepitus, Excoriation, Induration, Rash, Scarring, Dry/Scaly, Maceration, Atrophie Blanche, Cyanosis, Ecchymosis, Hemosiderin Staining, Pallor, Rubor, Erythema. Periwound temperature was noted as No Abnormality. The periwound has tenderness on palpation. Assessment Active Problems ICD-10 L97.212 - Non-pressure chronic ulcer of right calf with fat layer exposed [name], [name]. ([id number]) L97.512 - Non-pressure chronic ulcer of other part of right foot with fat layer exposed G35 - Multiple sclerosis E66.01 - Morbid (severe) obesity due to excess calories F17.218 - Nicotine dependence, cigarettes, with other nicotine-induced disorders L04.3 - Acute lymphadenitis of lower limb Plan Wound Cleansing: Wound #10 Left,Plantar Metatarsal head fifth: Clean wound with Normal Saline. Wound #9 Left,Lateral Lower Leg: Clean wound with Normal Saline. Anesthetic: Wound #10 Left,Plantar Metatarsal head fifth: Topical Lidocaine 4% cream applied to wound bed prior to debridement Wound #9 Left,Lateral Lower Leg: Topical Lidocaine 4% cream applied to wound bed prior to debridement Secondary Dressing: Wound #10 Left,Plantar Metatarsal head fifth: Boardered Foam Dressing Wound #9 Left,Lateral Lower Leg: Boardered Foam Dressing Dressing Change Frequency: Wound #10 Left,Plantar Metatarsal head fifth: Three times weekly Wound #9 Left,Lateral Lower Leg: Three times weekly Follow-up Appointments: Wound #10 Left,Plantar Metatarsal head fifth: Return Appointment in 1 week. Wound #9 Left,Lateral Lower Leg: Return Appointment in 1 week. Edema Control: Wound #10 Left,Plantar  Metatarsal head fifth: Patient to wear own compression stockings Wound #9 Left,Lateral Lower Leg: Patient to wear own compression stockings Home Health: Wound #10 Left,Plantar Metatarsal head fifth: [place] Nurse may visit PRN to address patient s wound care needs. FACE TO FACE ENCOUNTER: MEDICARE and MEDICAID PATIENTS: I certify that this patient is under my care and that I had a face-to-face encounter that meets the physician face-to-face encounter requirements with this patient on this date. The encounter with the patient was in whole or in part for the [facility]. ([id number]) following MEDICAL CONDITION: (primary reason for [place]) MEDICAL NECESSITY: I certify, that based on my findings, NURSING services are a medically necessary home health service. HOME BOUND STATUS: I certify that my clinical findings support that this patient is homebound (i.e., Due to illness or injury, pt requires aid of supportive devices such as crutches, cane, wheelchairs, walkers, the use of special transportation or the assistance of another person to leave their place of residence. There is a normal inability to leave the home and doing so requires considerable and taxing effort. Other absences are for medical reasons / religious services and are infrequent or of short duration when for other reasons). If current dressing causes regression in wound condition, may D/C ordered dressing product/s and apply Normal Saline Moist Dressing daily until next [place] / Other MD appointment. [place] of regression in wound condition at [phone]. Please direct any NON-WOUND related issues/requests for orders to patient's Primary Care Physician Wound #9 Left,Lateral Lower Leg: [place] Nurse may visit PRN to address patient s wound care needs. FACE TO FACE ENCOUNTER: MEDICARE and MEDICAID PATIENTS: I certify  that this patient is under my care and that I had a face-to-face encounter that meets the physician face-to-face encounter requirements with this patient on this date. The encounter with the patient was in whole or in part for the following MEDICAL CONDITION: (primary reason for Home  Healthcare) MEDICAL NECESSITY: I certify, that based on my findings, NURSING services are a medically necessary home health service. HOME BOUND STATUS: I certify that my clinical findings support that this patient is homebound (i.e., Due to illness or injury, pt requires aid of supportive devices such as crutches, cane, wheelchairs, walkers, the use of special transportation or the assistance of another person to leave their place of residence. There is a normal inability to leave the home and doing so requires considerable and taxing effort. Other absences are for medical reasons / religious services and are infrequent or of short duration when for other reasons). If current dressing causes regression in wound condition, may D/C ordered dressing product/s and apply Normal Saline Moist Dressing daily until next [place] / Other MD appointment. [place] of regression in wound condition at [phone]. Please direct any NON-WOUND related issues/requests for orders to patient's Primary Care Physician #1 we put border foam over the left fifth metatarsal head the left lateral leg and put her back in her own stocking. #2 I am puzzled by the massive enlargement of the left thigh that I have only really noticed on this admission she also has inguinal adenopathy documented with increasing size. I was prepared to call this reactive in November at which time I was treating her for cellulitis in the left leg. I'm not sure about that now. I suspect she would need a CT scan of her abdomen and pelvis with contrast with attention to the draining venous system from the left leg however I also note that  she has a creatinine of 1.54 on 02/28/16 giving her an estimated GFR of 34. Indicative of stage III chronic renal failure. She would not be able to handle much contrast. I will try to discuss the options with radiology #3 I would think some form of imaging study would be indicated before proceeding to biopsying these areas. #4 I have looked through [facility] including care everywhere. The patient has not had any recent abdominal or pelvic imaging. She did have an ultrasound of her bladder and kidneys in August 2016 I think [name], [name]. ([id number]) following up on hydronephrosis after placement of her Foley catheter. This made no mention of the issues I am concerned about. #5 her wounds are just about healed she has 20-30 mm below-knee stockings and I've let her put these on although I wonder whether these will be suffice to control the edema on the left leg Electronic Signature(s) Signed: 05/28/2016 5:54:02 PM By: [name] Ham MD Entered By: [name] on 05/28/2016 16:54:43 [name] -------------------------------------------------------------------------------- SuperBill Details Patient Name: [name] Date of Service: 05/28/2016 Medical Record Patient Account Number: [id number] Number: Treating RN: [name] April 10, 1951 (65 y.o. Other Clinician: Date of Birth/Sex: Female) Treating ROBSON, Gillis Primary Care Provider: LADA, [name] Provider/Extender: G Referring Provider: LADA, [name] Weeks in Treatment: 27 Diagnosis Coding ICD-10 Codes Code Description L97.212 Non-pressure chronic ulcer of right calf with fat layer exposed L97.512 Non-pressure chronic ulcer of other part of right foot with fat layer exposed G35 Multiple sclerosis E66.01 Morbid (severe) obesity due to excess calories F17.218 Nicotine dependence, cigarettes, with other nicotine-induced disorders L04.3 Acute lymphadenitis of lower limb L97.221 Non-pressure  chronic ulcer of left calf limited to breakdown of skin Facility Procedures CPT4 Code: [id number] Description: 99213 - WOUND CARE VISIT-LEV 3 EST PT Modifier: Quantity: 1 Physician Procedures CPT4 Code Description: [id number] - WC PHYS LEVEL 4 - EST PT  ICD-10 Description Diagnosis L97.221 Non-pressure chronic ulcer of left calf limited to L04.3 Acute lymphadenitis of lower limb Modifier: breakdown of sk Quantity: 1 in Electronic Signature(s) Signed: 05/28/2016 4:59:57 PM By: [name] Signed: 05/28/2016 5:54:02 PM By: [name] Ham MD Entered By: [name] on 05/28/2016 16:59:56

## 2016-05-29 NOTE — ED Provider Notes (Signed)
[facility] Emergency Department Provider Note  ____________________________________________  Time seen: Approximately 1:06 PM  I have reviewed the triage vital signs and the nursing notes.   HISTORY  Chief Complaint Fall   HPI [name] is a 66 y.o. female history of COPD, multiple sclerosis, morbidly obesity, hypertension, and peripheral vascular disease who presents for evaluation after mechanical fall. According to the patient and her husband patient has been having multiple falls recently. She fell earlier today on the driveway onto her knees and then an hour later she was getting up from her recliner when she fell again. Patient has been having progressively worsening weakness and swelling of her left lower extremity. She has had a 2 Dopplers with the second one done yesterday that showed no evidence of DVT however did see left inguinal lymphadenopathy of unknown etiology at this time. Patient reports that she has had weakness and swelling of that leg for 3 months. She has a walker home. She denies head trauma or LOC. She has no pain at this time. She has an indwelling Foley catheter that has been last changed a month ago due to her MS. She is followed with Dr. Manuella Ghazi, neurology however has not seen him in a few months. Patient denies headache, palpitations, chest pain, shortness of breath, back pain, abdominal pain both preceding or after the fall.  Past Medical History:  Diagnosis Date  . Anxiety   . COPD (chronic obstructive pulmonary disease) ([place])   . Depression   . Hypertension   . Kyphoscoliosis and scoliosis 11/26/2011  . Morbid obesity ([place]) 01/05/2016  . Multiple sclerosis ([place])   . Neuromuscular disorder ([place])   . Peripheral vascular disease of lower extremity with ulceration ([place]) 11/08/2015  . Skin ulcer ([place]) 11/08/2015    Patient Active Problem List   Diagnosis Date Noted  . Swelling of nose 01/07/2016  . Foreign body sensation in left  ear canal 01/07/2016  . Breast cancer screening 01/05/2016  . Morbid obesity ([place]) 01/05/2016  . Pressure ulcer 12/19/2015  . Low HDL (under 40) 12/19/2015  . Cellulitis 12/18/2015  . Skin ulcer ([place]) 11/08/2015  . Peripheral vascular disease of lower extremity with ulceration ([place]) 11/08/2015  . CKD (chronic kidney disease) stage 3, GFR 30-59 ml/min 11/08/2015  . Decubitus ulcer of left thigh, stage 2 04/18/2015  . Constipation due to pain medication 01/31/2015  . Obstructive sleep apnea of adult 01/13/2015  . Pelvic muscle wasting 01/13/2015  . Incomplete bladder emptying 01/13/2015  . Headache, migraine 10/24/2014  . 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 Current tobacco use 10/24/2014  . Lumbar radiculopathy, chronic 10/02/2013  . COPD with bronchial hyperresponsiveness ([place]) 10/02/2013  . Major depressive disorder, recurrent, in partial remission ([place]) 10/02/2013  . Hypertension goal BP (blood pressure) < 140/90 10/02/2013  . Multiple sclerosis ([place]) 10/02/2013  . Absence of bladder continence 09/25/2012  . Acontractile bladder 02/12/2012  . Bladder retention 02/12/2012  . Narrowing of intervertebral disc space 11/26/2011  . Kyphoscoliosis and scoliosis 11/26/2011    Past Surgical History:  Procedure Laterality Date  . BACK SURGERY N/A 2002  . TONSILLECTOMY AND ADENOIDECTOMY    . TUBAL LIGATION      Prior to Admission medications   Medication Sig Start Date End Date Taking? Authorizing Provider  buPROPion (BUDEPRION XL) 300 MG 24 hr tablet Take 1 tablet by mouth daily. 07/12/13   Historical Provider, MD  clonazePAM (KLONOPIN) 1 MG tablet Take 1 tablet by mouth 2 (two)  times daily as needed. 07/12/13   Historical Provider, MD  clotrimazole (LOTRIMIN) 1 % cream Apply 1 application topically 2 (two) times daily as needed. 12/19/15   Bettey Costa, MD  collagenase (SANTYL) ointment Apply topically daily. 12/19/15   Bettey Costa, MD  dalfampridine (AMPYRA) 10 MG TB12 Take 1 tablet by  mouth 2 (two) times daily. 10/21/13   Historical Provider, MD  DULoxetine (CYMBALTA) 60 MG capsule Take 1 capsule by mouth daily. 07/26/13   Historical Provider, MD  fluticasone furoate-vilanterol (BREO ELLIPTA) 100-25 MCG/INH AEPB Inhale 1 puff into the lungs daily. 01/05/16   Arnetha Courser, MD  gabapentin (NEURONTIN) 600 MG tablet Take 1 tablet (600 mg total) by mouth 3 (three) times daily. 05/18/16   Arnetha Courser, MD  hydrochlorothiazide (MICROZIDE) 12.5 MG capsule TAKE 1 CAPSULE (12.5 MG TOTAL) BY MOUTH DAILY. (THIS REPLACES TRIAMTERENE/HCTZ) 02/26/16   Arnetha Courser, MD  Interferon Beta-1a (AVONEX PEN) 30 MCG/0.5ML AJKT Inject 0.5 mLs into the skin once a week. 07/05/13   Historical Provider, MD  methylphenidate (RITALIN) 10 MG tablet  01/23/15   Historical Provider, MD  Multiple Vitamin (MULTI-VITAMINS) TABS Take 1 tablet by mouth daily.    Historical Provider, MD  polyethylene glycol powder (GLYCOLAX/MIRALAX) powder Take 17 g by mouth daily. Mixed in water 03/11/16   Arnetha Courser, MD  potassium chloride (K-DUR) 10 MEQ tablet Take 1 tablet (10 mEq total) by mouth daily. 12/22/15   Arnetha Courser, MD  QUEtiapine Fumarate (SEROQUEL XR) 150 MG 24 hr tablet Take 1 tablet by mouth at bedtime. 07/24/13   Historical Provider, MD  SPIRIVA HANDIHALER 18 MCG inhalation capsule INL THE CONTENTS OF 1 C VIA HANDIHALER PO D. 12/22/15   Historical Provider, MD  tiZANidine (ZANAFLEX) 4 MG tablet Take 4 mg by mouth every 6 (six) hours as needed for muscle spasms.    Historical Provider, MD  topiramate (TOPAMAX) 50 MG tablet Take 1 tablet (50 mg total) by mouth 2 (two) times daily. 05/18/16   Arnetha Courser, MD  traMADol (ULTRAM) 50 MG tablet Take 1 tablet (50 mg total) by mouth every 6 (six) hours as needed. 02/28/16   Daymon Larsen, MD  VESICARE 10 MG tablet TK 1 T PO QD 01/08/16   Historical Provider, MD    Allergies Patient has no known allergies.  Family History  Problem Relation Age of Onset  . COPD  Mother   . Diabetes Mother   . Alcohol abuse Father   . Kidney disease Father     Social History Social History  Substance Use Topics  . Smoking status: Former Smoker    Types: Cigarettes  . Smokeless tobacco: Never Used  . Alcohol use 0.0 oz/week     Comment: Pt states occasionally (liquor)    Review of Systems Constitutional: Negative for fever. Eyes: Negative for visual changes. ENT: Negative for facial injury or neck injury Cardiovascular: Negative for chest injury. Respiratory: Negative for shortness of breath. Negative for chest wall injury. Gastrointestinal: Negative for abdominal pain or injury. Genitourinary: Negative for dysuria. Musculoskeletal: Negative for back injury, + LLE swelling and weakness Skin: + b/l knee abrasions Neurological: Negative for head injury.  ____________________________________________   PHYSICAL EXAM:  VITAL SIGNS: ED Triage Vitals  Enc Vitals Group     BP 05/29/16 1247 (!) 96/55     Pulse Rate 05/29/16 1247 61     Resp 05/29/16 1247 12     Temp 05/29/16 1240 98.8 F (  37.1 C)     Temp Source 05/29/16 1240 Oral     SpO2 05/29/16 1247 97 %     Weight 05/29/16 1243 280 lb (127 kg)     Height 05/29/16 1243 5\' 4"  (1.626 m)     Head Circumference --      Peak Flow --      Pain Score 05/29/16 1243 8     Pain Loc --      Pain Edu? --      Excl. in [place]? --     Constitutional: Alert and oriented. No acute distress. Does not appear intoxicated. HEENT Head: Normocephalic and atraumatic. Face: No facial bony tenderness. Stable midface Ears: No hemotympanum bilaterally. No Battle sign Eyes: No eye injury. PERRL. No raccoon eyes Nose: Nontender. No epistaxis. No rhinorrhea Mouth/Throat: Mucous membranes are moist. No oropharyngeal blood. No dental injury. Airway patent without stridor. Normal voice. Neck: No C-collar in place. No midline c-spine tenderness.  Cardiovascular: Normal rate, regular rhythm. Normal and symmetric distal  pulses are present in all extremities. Pulmonary/Chest: Chest wall is stable and nontender to palpation/compression. Normal respiratory effort. Breath sounds are normal. No crepitus.  Abdominal: Soft, nontender, non distended. Foley catheter in place Musculoskeletal: Nontender with normal full range of motion in all extremities. No deformities. No thoracic or lumbar midline spinal tenderness. Pelvis is stable. L thigh is larger than R with no ttp Skin: Skin is warm, dry and intact. B/l knee abrasions Psychiatric: Speech and behavior are appropriate. Neurological: Normal speech and language. Moves all extremities to command. No gross focal neurologic deficits are appreciated.  Glascow Coma Score: 4 - Opens eyes on own 6 - Follows simple motor commands 5 - Alert and oriented GCS: 15   ____________________________________________   LABS (all labs ordered are listed, but only abnormal results are displayed)  Labs Reviewed  CBC WITH DIFFERENTIAL/PLATELET - Abnormal; Notable for the following:       Result Value   Hemoglobin 11.5 (*)    HCT 33.8 (*)    RDW 15.0 (*)    All other components within normal limits  BASIC METABOLIC PANEL - Abnormal; Notable for the following:    Glucose, Bld 115 (*)    BUN 22 (*)    Creatinine, Ser 1.36 (*)    Calcium 8.8 (*)    GFR calc non Af Amer 40 (*)    GFR calc Af Amer 46 (*)    All other components within normal limits  URINALYSIS, COMPLETE (UACMP) WITH MICROSCOPIC - Abnormal; Notable for the following:    Color, Urine STRAW (*)    APPearance CLEAR (*)    Hgb urine dipstick SMALL (*)    Leukocytes, UA TRACE (*)    Squamous Epithelial / LPF 0-5 (*)    All other components within normal limits   ____________________________________________  EKG  none  ____________________________________________  RADIOLOGY  none  ____________________________________________   PROCEDURES  Procedure(s) performed: None Procedures Critical Care  performed:  None ____________________________________________   INITIAL IMPRESSION / ASSESSMENT AND PLAN / ED COURSE   66 y.o. female history of COPD, multiple sclerosis, morbidly obesity, hypertension, and peripheral vascular disease who presents for evaluation after mechanical fall.Patient with multiple recent falls due to left lower extremity swelling and weakness. She is currently being evaluated and had a Doppler yesterday showing inguinal lymphadenopathy but no DVT. Patient does have a walker at home. She did not sustain any injuries from this fall. We'll check basic labs and a UA  to rule out acute kidney injury, UTI, or electrolyte abnormalities as possible contributing factors to her recent multiple falls. Patient fell onto her knees and has full painless range of motion, no head trauma or LOC, she is not any blood thinners. No indication for CT head or C-spine.  Clinical Course as of May 29 1612  Wed May 29, 2016  1612 Labs and urine were no acute findings. Foley catheter was changed here. Patient be discharged home to follow-up with her primary care doctor Lada for further evaluation of left-sided inguinal lymphadenopathy. She'll also follow-up with her neurologist.  [CV]    Clinical Course User Index [CV] Rudene Re, MD    Pertinent labs & imaging results that were available during my care of the patient were reviewed by me and considered in my medical decision making (see chart for details).    ____________________________________________   FINAL CLINICAL IMPRESSION(S) / ED DIAGNOSES  Final diagnoses:  Fall, initial encounter  Abrasion of knee, unspecified laterality, initial encounter      NEW MEDICATIONS STARTED DURING THIS VISIT:  New Prescriptions   No medications on file     Note:  This document was prepared using Dragon voice recognition software and may include unintentional dictation errors.    Rudene Re, MD 05/29/16 [phone]

## 2016-05-29 NOTE — Discharge Instructions (Signed)
You were seen in the emergency department after a fall. Luckily all of your imaging studies did not show any evidence of injuries. Follow-up with you doctor within the next 2-3 days for further evaluation. Sometimes injuries can present at a later time and therefore it is imperative that you return to the emergency room if you have a severe headache, facial droop, neck pain, numbness or weakness of your extremities, slurred speech, difficulty finding words, chest pain, back pain, abdominal pain, or any other new symptoms that were not present during this visit. You may take Tylenol at home for your pain. ° °

## 2016-05-29 NOTE — Telephone Encounter (Signed)
[name] (husband) stopped by to inform you that patient is currently at the ER due to her leg swelling up on her. States that she is not able to walk. Pt had appointment for today therefore I will cancel it.

## 2016-05-29 NOTE — Telephone Encounter (Signed)
Thank you for the update; we hope she'll be okay

## 2016-05-30 NOTE — Telephone Encounter (Signed)
Ct scan order for february 8th 2018 @ 9:30am  Outpatient imaging. I will ask one of the ladys to call her an confirmed the appt and schedule an appointment see you the day after.

## 2016-05-31 DIAGNOSIS — L97929 Non-pressure chronic ulcer of unspecified part of left lower leg with unspecified severity: Secondary | ICD-10-CM | POA: Diagnosis not present

## 2016-05-31 DIAGNOSIS — J449 Chronic obstructive pulmonary disease, unspecified: Secondary | ICD-10-CM | POA: Diagnosis not present

## 2016-05-31 DIAGNOSIS — L97219 Non-pressure chronic ulcer of right calf with unspecified severity: Secondary | ICD-10-CM | POA: Diagnosis not present

## 2016-05-31 DIAGNOSIS — R339 Retention of urine, unspecified: Secondary | ICD-10-CM | POA: Diagnosis not present

## 2016-05-31 DIAGNOSIS — I70249 Atherosclerosis of native arteries of left leg with ulceration of unspecified site: Secondary | ICD-10-CM | POA: Diagnosis not present

## 2016-05-31 DIAGNOSIS — I70232 Atherosclerosis of native arteries of right leg with ulceration of calf: Secondary | ICD-10-CM | POA: Diagnosis not present

## 2016-05-31 DIAGNOSIS — G35 Multiple sclerosis: Secondary | ICD-10-CM | POA: Diagnosis not present

## 2016-05-31 DIAGNOSIS — I10 Essential (primary) hypertension: Secondary | ICD-10-CM | POA: Diagnosis not present

## 2016-05-31 DIAGNOSIS — I70234 Atherosclerosis of native arteries of right leg with ulceration of heel and midfoot: Secondary | ICD-10-CM | POA: Diagnosis not present

## 2016-05-31 DIAGNOSIS — L97419 Non-pressure chronic ulcer of right heel and midfoot with unspecified severity: Secondary | ICD-10-CM | POA: Diagnosis not present

## 2016-05-31 DIAGNOSIS — Z792 Long term (current) use of antibiotics: Secondary | ICD-10-CM | POA: Diagnosis not present

## 2016-05-31 DIAGNOSIS — L03116 Cellulitis of left lower limb: Secondary | ICD-10-CM | POA: Diagnosis not present

## 2016-06-01 NOTE — Telephone Encounter (Signed)
Open phone note Patient has appt already scheduled now Signing off on this note

## 2016-06-03 DIAGNOSIS — N319 Neuromuscular dysfunction of bladder, unspecified: Secondary | ICD-10-CM | POA: Diagnosis not present

## 2016-06-05 ENCOUNTER — Encounter: Payer: Medicare Other | Attending: Internal Medicine | Admitting: Internal Medicine

## 2016-06-05 DIAGNOSIS — L97512 Non-pressure chronic ulcer of other part of right foot with fat layer exposed: Secondary | ICD-10-CM | POA: Diagnosis not present

## 2016-06-05 DIAGNOSIS — F17218 Nicotine dependence, cigarettes, with other nicotine-induced disorders: Secondary | ICD-10-CM | POA: Diagnosis not present

## 2016-06-05 DIAGNOSIS — G4733 Obstructive sleep apnea (adult) (pediatric): Secondary | ICD-10-CM | POA: Insufficient documentation

## 2016-06-05 DIAGNOSIS — L97212 Non-pressure chronic ulcer of right calf with fat layer exposed: Secondary | ICD-10-CM | POA: Insufficient documentation

## 2016-06-05 DIAGNOSIS — J449 Chronic obstructive pulmonary disease, unspecified: Secondary | ICD-10-CM | POA: Diagnosis not present

## 2016-06-05 DIAGNOSIS — Z79899 Other long term (current) drug therapy: Secondary | ICD-10-CM | POA: Diagnosis not present

## 2016-06-05 DIAGNOSIS — G35 Multiple sclerosis: Secondary | ICD-10-CM | POA: Insufficient documentation

## 2016-06-05 DIAGNOSIS — L97821 Non-pressure chronic ulcer of other part of left lower leg limited to breakdown of skin: Secondary | ICD-10-CM | POA: Diagnosis not present

## 2016-06-05 DIAGNOSIS — L043 Acute lymphadenitis of lower limb: Secondary | ICD-10-CM | POA: Diagnosis not present

## 2016-06-05 DIAGNOSIS — R32 Unspecified urinary incontinence: Secondary | ICD-10-CM | POA: Diagnosis not present

## 2016-06-05 DIAGNOSIS — I739 Peripheral vascular disease, unspecified: Secondary | ICD-10-CM | POA: Diagnosis not present

## 2016-06-05 DIAGNOSIS — Z6841 Body Mass Index (BMI) 40.0 and over, adult: Secondary | ICD-10-CM | POA: Diagnosis not present

## 2016-06-05 DIAGNOSIS — L97222 Non-pressure chronic ulcer of left calf with fat layer exposed: Secondary | ICD-10-CM | POA: Insufficient documentation

## 2016-06-05 DIAGNOSIS — I10 Essential (primary) hypertension: Secondary | ICD-10-CM | POA: Insufficient documentation

## 2016-06-05 DIAGNOSIS — F419 Anxiety disorder, unspecified: Secondary | ICD-10-CM | POA: Diagnosis not present

## 2016-06-05 DIAGNOSIS — L97221 Non-pressure chronic ulcer of left calf limited to breakdown of skin: Secondary | ICD-10-CM | POA: Diagnosis not present

## 2016-06-06 ENCOUNTER — Ambulatory Visit: Admission: RE | Admit: 2016-06-06 | Payer: Medicare Other | Source: Ambulatory Visit

## 2016-06-06 NOTE — Progress Notes (Signed)
[name], [name] ([id number]) Visit Report for 06/05/2016 Chief Complaint Document Details Patient Name: [name], [name] Date of Service: 06/05/2016 1:30 PM Medical Record Patient Account Number: [id number] Number: Treating RN: Montey Hora 02-28-1951 (66 y.o. Other Clinician: Date of Birth/Sex: Female) Treating Taisley Mordan Primary Care Provider: LADA, MELINDA Provider/Extender: G Referring Provider: LADA, MELINDA Weeks in Treatment: 28 Information Obtained from: Patient Chief Complaint returns for evaluation of bilateral lower extremity ulcerations Electronic Signature(s) Signed: 06/05/2016 4:28:51 PM By: Linton Ham MD Entered By: Linton Ham on 06/05/2016 14:51:13 [name] ([id number]) -------------------------------------------------------------------------------- HPI Details Patient Name: [name] Date of Service: 06/05/2016 1:30 PM Medical Record Patient Account Number: [id number] Number: Treating RN: Montey Hora 09/18/1950 (66 y.o. Other Clinician: Date of Birth/Sex: Female) Treating Alonah Lineback Primary Care Provider: LADA, MELINDA Provider/Extender: G Referring Provider: LADA, MELINDA Weeks in Treatment: 28 History of Present Illness Location: left and right lower extremity and dorsum of the right foot Quality: Patient reports experiencing a sharp pain to affected area(s). Severity: Patient rates the pain to be a 8 out of 10 especially with palpation Duration: Patient has had the wound for > 6 months prior to seeking treatment at the wound center Timing: Pain in wound is Intermittent in severity but persistent Context: The wound appeared gradually over time Modifying Factors: Other treatment(s) tried include: symptomatic treatment as advised by her PCP Associated Signs and Symptoms: Patient reports having increase swelling iin her bilateral lower extremities HPI Description: 66 year old with a past medical history  significant for MS, urinary incontinence, and obesity. She has been seen in the wound clinic before for lower extremity ulcerations treated with compression therapy. she is also known to have hypertension, peripheral vascular disease, COPD, obstructive sleep apnea, lumbar radiculopathy, kyphoscoliosis, urinary issues and tobacco abuse. Smokes a packet of cigarettes a day was recently seen at the [facility] for swelling of her legs and feet with a ulceration on the dorsum of the right foot which has been there for about 6 months. she was recently in the ER about a month ago where she was seen for shortness of breath and swelling of the legs and a chest x-ray was within normal limits had an increase in her BNP and was given Lasix and put on doxycycline for a mild cellulitis and possible UTI. Wounds aresmaller today 11/13/15. Debrided and will continue Santyl. 12/21/2015 -- she was admitted to the hospital overnight on 12/18/2015 and diagnosed with urinary retention and cellulitis of the left lower leg. is past to take clindamycin and use Santyl for her wounds. 01/15/2016 -- come back to see [place] for almost a month and continues to be noncompliant with her dressings 01/30/16 patient presents today for a follow-up visit concerning her ongoing bilateral lower extremity wounds. We have not seen her for the past 2 weeks although we are supposed to be seen her for weekly visits. She currently has a Foley catheter. She tells me that the wounds are intensely painful especially with pressure and palpation at this point in time. Fortunately she is having no interval signs or symptoms of systemic infection but unfortunately the wounds have not improved dramatically since we last saw her. She does have home health coming to take care of her as well. She is currently not in any compression wraps. 02/06/16 ON evaluation today patient continues to experience discomfort regarding her bilateral  lower extremity ulcers. She has continued to tolerate the dressing changes at this point in time and continues  to have a Foley catheter as well. Fortunately she is back this week in the past she has been somewhat noncompliant with follow-ups I'm glad to see her today. She tells me that her pain level varies but can be as high as a 7 out of 10 with manipulation of the wound. She tells me that she used to be on oxycodone which [name], [name] H. ([id number]) was managed by the pain clinic although she is no longer on that as she tells me that she was actually smoking marijuana at the time and when this was found out they discontinued her pain medication. She no longer is taking anything pain medication wise and she also does not smoke cigarettes nor marijuana at this point. 02/12/16; this is a patient I don't believe I have previously seen. She has multiple sclerosis. She has 4 punched-out areas on the anterior lateral left leg 2 areas on the right these are all in the same condition. Reasonably small [dime size] wounds each was some depth. Surface of these wounds does not look particularly healthy as there is adherent slough. There is no evidence of surrounding infection or inflammation. ABIs in this clinic were 0.87 on the right and 0.81 on the left. She is listed as having PAD and is a smoker. Not a diabetic 02/20/16 patient I gave antibiotics to last week for erythema around both wound sites on the left lateral leg and right lateral leg. This appears to be a lot better. One of the areas on the left leg is healed however she still has 3 punched-out open areas on the left lateral calf and one on the right lateral calf and one on the dorsal foot. She is an ex-smoker quitting 1 month ago. ABIs in this clinic were 0.87 on the right 0.81 on the left 03/12/16; this is a patient I really don't have a good sense of. It would appear for the first 5 or 6 weeks of her stay in this clinic she was cared for by  Dr. Con Memos. She appeared on my schedule in mid October and I saw her twice. She has not been here however in 3 weeks. She has advanced Wound Care at home where she lives with her husband. She has multiple sclerosis. I saw her the first time she had 4 punched-out small wounds on the anterior lateral left leg it appears that she is now down to 2. She also had wounds on the lateral and medial aspect of her right leg which were also small and punched out. The only one that remains is on the right lateral. Because of the nature of her wound I went ahead and ordered formal arterial studies this showed an ABI of 1 on the right and one on the left. TBI of 1.4 on the right and 0.79 on the left. She had normal waveforms. She was in the emergency room on 02/28/16; there is a noted edema of her left leg after a fall. She had a duplex ultrasound that showed no evidence for an acute DVT from the left groin to the popliteal fossa. The study was limited in the calf veins due to edema. Also noticeable that she had a left inguinal enlarged lymph node up to 6.2 cm. She also had a left knee x-ray that showed no acute findings and a right foot x-ray that showed soft tissue swelling but no radiographic evidence of osteomyelitis. The patient does stop smoking 03-19-16 Ms Holton presents today for evaluation of her bilateral lower  extremity ulcerations. She states that she has not smoked in several weeks. She denies any pain or discomfort to bilateral lower extremities, tolerating compression therapy as ordered. 03/26/16; I have not seen this patient in 2 weeks however she has done very well with improvements in the areas in question. After we obtain normal arterial studies, increasing the 4-layer compression really seems to look done a nice job here. She has one open area on the left lateral leg and one on the medial right leg. These have filled in nicely and are now superficial wounds that showed epithelialized. I note  the left inguinal lymph node at 6.2 cm. I may need to refer this back to the patient's primary doctor. 04/03/16; patient has 2 remaining wounds 1 on the left lateral leg and one on the right medial leg. Both of filled in nicely since we are able to increase her from a 3 layer to a 4 layer compression. I am also following up on the lymph node notable on her left leg DVT rule out in November 04/10/16; area on the right lateral leg is healed. The area on the left leg is still open but looks considerably better with healthy granulation and less wound area. I12/20/17; last week we healed the patient doubt with regards to the wound on the right leg to her on compression stocking 20-30 mmHg as it turns out I don't think she had a stocking, predictably therefore she has reopened. The left leg as well as the wound on the lateral aspect. Been generally small line 04/24/16; we healed out her right leg 2 weeks ago to her own 20-30 mm compression stockings although as it turns out she didn't have these and did not purchase some apparently because of financial issues. The left leg wound is on the lateral aspect. Both of these wounds are just about healed. 05/01/16; her wounds on the left leg are healed out today. The area on the right leg is also healed. Vitamin diligent effort of our staff we have not been able to get any form of compression stocking to this patient. The [name], [name] ([id number]) orders for juxtalite's were given to home health this never materialized. We ordered compression stockings I don't think she was able to afford these. We had a discussion today with both the patient and her husband without these, will accumulate edema and the wounds will simply reopen again. 05/14/16 READMISSION this is a patient we healed out 2 weeks ago. As bilateral lower extremity venous wounds probably some degree of lymphedema. At the beginning in November I did a duplex ultrasound of her left leg that  was negative for DVT but did show a lymph node in the left inguinal area presumably reactive. We've been using compression to her lower extremities eventually healed all her wounds on her bilateral calves but we were not able to get any form of compression stockings for her in spite of intense efforts of our staff. She returns today with reopening on the left leg did not the right she has several superficial areas anteriorly but an actual frank ulcer on the lateral left calf. This is about the size of a dime or smaller. She does not really complain of pain. The patient has a history of PAD however arterial studies we did in November were normal. Her ABIs and Doppler waveforms were both within the normal limits. The patient has a history of MS. In discussing things with her today it would appear that the opening  was noted 2 weeks ago according to the patient. We gave her Tubigrip stockings when she left the clinic 05/21/16; she has not yet had the duplex ultrasound of her thigh, this is booked for Thursday. The wound on the left lateral lower leg is improved 05/28/16; her duplex ultrasound of the left leg specifically her left thigh did not show a DVT however did show hypoechogenic enlarged left inguinal lymph nodes up to 6.9 x 3.7 x 6.8. This also showed in the one in November however maximal diameter was 6.2, at that point I thought these were reactive secondary to cellulitis however there is obviously a differential here. Her wound on the left lower leg is closed. She has a superficial open area on the base of her left fifth metatarsal head on the plantar foot. It looks as though she has some wrap injuries on the anterior leg 06/05/16; since she was last year she had a fall on 1/31. She was seen in the ER but sent back home. I think she is also been seen by her primary physician who picked up on the swelling and the lymphadenopathy. She is ordered a CT scan of the abdomen and pelvis however this  will be without contrast because of stage IIIB renal insufficiency. Nevertheless this should be a start the workup to make sure there is not is more systemic problem here. She will probably need consideration of a biopsy of the lymph node in her inguinal area, this would need general surgery. As far as her wounds are concern today the area on the left plantar foot is healed. She has a small weeping area on the left lower leg. The wrap injury from last week has largely Pardoxically there is a lot less edema in the left thigh. Electronic Signature(s) Signed: 06/05/2016 4:28:51 PM By: Linton Ham MD Entered By: Linton Ham on 06/05/2016 14:54:14 [name] -------------------------------------------------------------------------------- Physical Exam Details Patient Name: [name] Date of Service: 06/05/2016 1:30 PM Medical Record Patient Account Number: [id number] Number: Treating RN: Montey Hora 08-Mar-1951 (65 y.o. Other Clinician: Date of Birth/Sex: Female) Treating Imari Sivertsen Primary Care Provider: LADA, MELINDA Provider/Extender: G Referring Provider: LADA, MELINDA Weeks in Treatment: 28 Constitutional Sitting or standing Blood Pressure is within target range for patient.. Pulse regular and within target range for patient.Marland Kitchen Respirations regular, non-labored and within target range.. Temperature is normal and within the target range for the patient.. Patient's appearance is neat and clean. Appears in no acute distress. Well nourished and well developed.. Eyes Conjunctivae clear. No discharge.Marland Kitchen Respiratory Respiratory effort is easy and symmetric bilaterally. Rate is normal at rest and on room air.. Shallower and treated but no wheezing. Cardiovascular Heart rhythm and rate regular, without murmur or gallop.. I believe her dorsalis pedis pulses palpable but reduced the swelling in the foot. She is a stocking in the right leg. On the left  leg large amount of edema mostly nonpitting. I am concerned about the viability of the skin underneath this.. Gastrointestinal (GI) Abdomen is soft and non-distended without masses or tenderness. Bowel sounds active in all quadrants.. No liver or spleen enlargement or tenderness.. Lymphatic Nonpalpable in the popliteal or inguinal area. No lymphadenopathy or glandular swelling noted in neck.. No lymphadenopathy or glandular swelling noted in axillae.. No lymphadenopathy or glandular swelling noted in groin.Marland Kitchen Psychiatric Patient appears depressed today.. Notes Wound exam; the patient only has superficial wounds remaining. On the left lateral leg a very small wound that is leaking edema fluid.  Her wrap injury anteriorly from last week has mostly epithelialized although it is also leaking edema fluid. The area that was on her left foot has healed over [fifth metatarsal head]. Electronic Signature(s) Signed: 06/05/2016 4:28:51 PM By: Linton Ham MD Entered By: Linton Ham on 06/05/2016 14:56:55 [name] -------------------------------------------------------------------------------- Physician Orders Details Patient Name: [name] Date of Service: 06/05/2016 1:30 PM Medical Record Patient Account Number: [id number] Number: Treating RN: Montey Hora 1950-10-11 (65 y.o. Other Clinician: Date of Birth/Sex: Female) Treating Jackilyn Umphlett Primary Care Provider: LADA, MELINDA Provider/Extender: G Referring Provider: LADA, MELINDA Weeks in Treatment: 42 Verbal / Phone Orders: Yes Clinician: Dorthy, Joanna Read Back and Verified: Yes Diagnosis Coding Wound Cleansing Wound #11 Left,Anterior Lower Leg o Clean wound with Normal Saline. Wound #9 Left,Lateral Lower Leg o Clean wound with Normal Saline. Anesthetic Wound #11 Left,Anterior Lower Leg o Topical Lidocaine 4% cream applied to wound bed prior to debridement Wound #9 Left,Lateral  Lower Leg o Topical Lidocaine 4% cream applied to wound bed prior to debridement Primary Wound Dressing Wound #11 Left,Anterior Lower Leg o Aquacel Ag Wound #9 Left,Lateral Lower Leg o Aquacel Ag Secondary Dressing Wound #11 Left,Anterior Lower Leg o ABD pad o Foam Wound #9 Left,Lateral Lower Leg o ABD pad o Foam Dressing Change Frequency Wound #11 Left,Anterior Lower Leg o Three times weekly Cagley, Yessica H. ([id number]) Wound #9 Left,Lateral Lower Leg o Three times weekly Follow-up Appointments Wound #11 Left,Anterior Lower Leg o Return Appointment in 1 week. Wound #9 Left,Lateral Lower Leg o Return Appointment in 1 week. Edema Control Wound #11 Left,Anterior Lower Leg o Kerlix and Coban - Left Lower Extremity - do not wrap too tight o Elevate legs to the level of the heart and pump ankles as often as possible Wound #9 Left,Lateral Lower Leg o Kerlix and Coban - Left Lower Extremity - do not wrap too tight o Elevate legs to the level of the heart and pump ankles as often as possible Home Health Wound #11 [place] Nurse may visit PRN to address patientos wound care needs. o FACE TO FACE ENCOUNTER: MEDICARE and MEDICAID PATIENTS: I certify that this patient is under my care and that I had a face-to-face encounter that meets the physician face-to-face encounter requirements with this patient on this date. The encounter with the patient was in whole or in part for the following MEDICAL CONDITION: (primary reason for [place]) MEDICAL NECESSITY: I certify, that based on my findings, NURSING services are a medically necessary home health service. HOME BOUND STATUS: I certify that my clinical findings support that this patient is homebound (i.e., Due to illness or injury, pt requires aid of supportive devices such as crutches, cane, wheelchairs, walkers, the use of  special transportation or the assistance of another person to leave their place of residence. There is a normal inability to leave the home and doing so requires considerable and taxing effort. Other absences are for medical reasons / religious services and are infrequent or of short duration when for other reasons). o If current dressing causes regression in wound condition, may D/C ordered dressing product/s and apply Normal Saline Moist Dressing daily until next [place] / Other MD appointment. [place] of regression in wound condition at [phone]. o Please direct any NON-WOUND related issues/requests for orders to patient's Primary Care Physician Wound #9 Left,Lateral Lower Leg o [place] Visits o Home  Health Nurse may visit PRN to address patientos wound care needs. o FACE TO FACE ENCOUNTER: MEDICARE and MEDICAID PATIENTS: I certify that this patient is under my care and that I had a face-to-face encounter that meets the physician face-to-face encounter requirements with this patient on this date. The encounter with the patient was in [place], [place] [name]. ([id number]) whole or in part for the following MEDICAL CONDITION: (primary reason for [place]) MEDICAL NECESSITY: I certify, that based on my findings, NURSING services are a medically necessary home health service. HOME BOUND STATUS: I certify that my clinical findings support that this patient is homebound (i.e., Due to illness or injury, pt requires aid of supportive devices such as crutches, cane, wheelchairs, walkers, the use of special transportation or the assistance of another person to leave their place of residence. There is a normal inability to leave the home and doing so requires considerable and taxing effort. Other absences are for medical reasons / religious services and are infrequent or of short duration when for other reasons). o If current dressing  causes regression in wound condition, may D/C ordered dressing product/s and apply Normal Saline Moist Dressing daily until next [place] / Other MD appointment. [place] of regression in wound condition at [phone]. o Please direct any NON-WOUND related issues/requests for orders to patient's Primary Care Physician Electronic Signature(s) Signed: 06/05/2016 4:28:51 PM By: Linton Ham MD Signed: 06/05/2016 5:09:18 PM By: Montey Hora Entered By: Montey Hora on 06/05/2016 14:00:45 [name] -------------------------------------------------------------------------------- Problem List Details Patient Name: [name] Date of Service: 06/05/2016 1:30 PM Medical Record Patient Account Number: [id number] Number: Treating RN: Montey Hora 10-09-1950 (65 y.o. 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 11/20/2015 Yes exposed L97.512 Non-pressure chronic ulcer of other part of right foot with 11/20/2015 Yes fat layer exposed G35 Multiple sclerosis 11/20/2015 Yes E66.01 Morbid (severe) obesity due to excess calories 11/20/2015 Yes F17.218 Nicotine dependence, cigarettes, with other nicotine- 11/20/2015 Yes induced disorders L04.3 Acute lymphadenitis of lower limb 04/03/2016 Yes Inactive Problems ICD-10 Code Description Active Date Inactive Date L97.222 Non-pressure chronic ulcer of left calf with fat layer 11/20/2015 11/20/2015 exposed Resolved Problems [name], [name] Electronic Signature(s) Signed: 06/05/2016 4:28:51 PM By: Linton Ham MD Entered By: Linton Ham on 06/05/2016 14:48:58 [name]  ([id number]) -------------------------------------------------------------------------------- Progress Note Details Patient Name: [name] Date of Service: 06/05/2016 1:30 PM Medical Record Patient Account Number: [id number] Number: Treating RN: Montey Hora 16-Mar-1951 (65 y.o. Other Clinician: Date of Birth/Sex: Female) Treating Nakenya Theall Primary Care Provider: LADA, MELINDA Provider/Extender: G Referring Provider: LADA, MELINDA Weeks in Treatment: 28 Subjective Chief Complaint Information obtained from Patient returns for evaluation of bilateral lower extremity ulcerations History of Present Illness (HPI) The following HPI elements were documented for the patient's wound: Location: left and right lower extremity and dorsum of the right foot Quality: Patient reports experiencing a sharp pain to affected area(s). Severity: Patient rates the pain to be a 8 out of 10 especially with palpation Duration: Patient has had the wound for > 6 months prior to seeking treatment at the wound center Timing: Pain in wound is Intermittent in severity but persistent Context: The wound appeared gradually over time Modifying Factors: Other treatment(s) tried  include: symptomatic treatment as advised by her PCP Associated Signs and Symptoms: Patient reports having increase swelling iin her bilateral lower extremities 66 year old with a past medical history significant for MS, urinary incontinence, and obesity. She has been seen in the wound clinic before for lower extremity ulcerations treated with compression therapy. she is also known to have hypertension, peripheral vascular disease, COPD, obstructive sleep apnea, lumbar radiculopathy, kyphoscoliosis, urinary issues and tobacco abuse. Smokes a packet of cigarettes a day was recently seen at the [facility] for swelling of her legs and feet with a ulceration on the dorsum of the right foot which has been there for  about 6 months. she was recently in the ER about a month ago where she was seen for shortness of breath and swelling of the legs and a chest x-ray was within normal limits had an increase in her BNP and was given Lasix and put on doxycycline for a mild cellulitis and possible UTI. Wounds aresmaller today 11/13/15. Debrided and will continue Santyl. 12/21/2015 -- she was admitted to the hospital overnight on 12/18/2015 and diagnosed with urinary retention and cellulitis of the left lower leg. is past to take clindamycin and use Santyl for her wounds. 01/15/2016 -- come back to see [place] for almost a month and continues to be noncompliant with her dressings 01/30/16 patient presents today for a follow-up visit concerning her ongoing bilateral lower extremity wounds. We have not seen her for the past 2 weeks although we are supposed to be seen her for weekly visits. She currently has a Foley catheter. She tells me that the wounds are intensely painful especially with pressure and palpation at this point in time. Fortunately she is having no interval signs or symptoms of systemic infection but unfortunately the wounds have not improved dramatically since we last saw her. She does have home health coming to take care of her as well. She is currently not in any compression wraps. [name], [name] 02/06/16 ON evaluation today patient continues to experience discomfort regarding her bilateral lower extremity ulcers. She has continued to tolerate the dressing changes at this point in time and continues to have a Foley catheter as well. Fortunately she is back this week in the past she has been somewhat noncompliant with follow-ups I'm glad to see her today. She tells me that her pain level varies but can be as high as a 7 out of 10 with manipulation of the wound. She tells me that she used to be on oxycodone which was managed by the pain clinic although she is no longer on that as she tells me that  she was actually smoking marijuana at the time and when this was found out they discontinued her pain medication. She no longer is taking anything pain medication wise and she also does not smoke cigarettes nor marijuana at this point. 02/12/16; this is a patient I don't believe I have previously seen. She has multiple sclerosis. She has 4 punched-out areas on the anterior lateral left leg 2 areas on the right these are all in the same condition. Reasonably small [dime size] wounds each was some depth. Surface of these wounds does not look particularly healthy as there is adherent slough. There is no evidence of surrounding infection or inflammation. ABIs in this clinic were 0.87 on the right and 0.81 on the left. She is listed as having PAD and is a smoker. Not a diabetic 02/20/16 patient I gave antibiotics to last week for  erythema around both wound sites on the left lateral leg and right lateral leg. This appears to be a lot better. One of the areas on the left leg is healed however she still has 3 punched-out open areas on the left lateral calf and one on the right lateral calf and one on the dorsal foot. She is an ex-smoker quitting 1 month ago. ABIs in this clinic were 0.87 on the right 0.81 on the left 03/12/16; this is a patient I really don't have a good sense of. It would appear for the first 5 or 6 weeks of her stay in this clinic she was cared for by Dr. Con Memos. She appeared on my schedule in mid October and I saw her twice. She has not been here however in 3 weeks. She has advanced Wound Care at home where she lives with her husband. She has multiple sclerosis. I saw her the first time she had 4 punched-out small wounds on the anterior lateral left leg it appears that she is now down to 2. She also had wounds on the lateral and medial aspect of her right leg which were also small and punched out. The only one that remains is on the right lateral. Because of the nature of her wound I  went ahead and ordered formal arterial studies this showed an ABI of 1 on the right and one on the left. TBI of 1.4 on the right and 0.79 on the left. She had normal waveforms. She was in the emergency room on 02/28/16; there is a noted edema of her left leg after a fall. She had a duplex ultrasound that showed no evidence for an acute DVT from the left groin to the popliteal fossa. The study was limited in the calf veins due to edema. Also noticeable that she had a left inguinal enlarged lymph node up to 6.2 cm. She also had a left knee x-ray that showed no acute findings and a right foot x-ray that showed soft tissue swelling but no radiographic evidence of osteomyelitis. The patient does stop smoking 03-19-16 Ms Branon presents today for evaluation of her bilateral lower extremity ulcerations. She states that she has not smoked in several weeks. She denies any pain or discomfort to bilateral lower extremities, tolerating compression therapy as ordered. 03/26/16; I have not seen this patient in 2 weeks however she has done very well with improvements in the areas in question. After we obtain normal arterial studies, increasing the 4-layer compression really seems to look done a nice job here. She has one open area on the left lateral leg and one on the medial right leg. These have filled in nicely and are now superficial wounds that showed epithelialized. I note the left inguinal lymph node at 6.2 cm. I may need to refer this back to the patient's primary doctor. 04/03/16; patient has 2 remaining wounds 1 on the left lateral leg and one on the right medial leg. Both of filled in nicely since we are able to increase her from a 3 layer to a 4 layer compression. I am also following up on the lymph node notable on her left leg DVT rule out in November 04/10/16; area on the right lateral leg is healed. The area on the left leg is still open but looks considerably better with healthy granulation and  less wound area. I12/20/17; last week we healed the patient doubt with regards to the wound on the right leg to her on compression stocking 20-30  mmHg as it turns out I don't think she had a stocking, predictably therefore she [name], [name] H. ([id number]) has reopened. The left leg as well as the wound on the lateral aspect. Been generally small line 04/24/16; we healed out her right leg 2 weeks ago to her own 20-30 mm compression stockings although as it turns out she didn't have these and did not purchase some apparently because of financial issues. The left leg wound is on the lateral aspect. Both of these wounds are just about healed. 05/01/16; her wounds on the left leg are healed out today. The area on the right leg is also healed. Vitamin diligent effort of our staff we have not been able to get any form of compression stocking to this patient. The orders for juxtalite's were given to home health this never materialized. We ordered compression stockings I don't think she was able to afford these. We had a discussion today with both the patient and her husband without these, will accumulate edema and the wounds will simply reopen again. 05/14/16 READMISSION this is a patient we healed out 2 weeks ago. As bilateral lower extremity venous wounds probably some degree of lymphedema. At the beginning in November I did a duplex ultrasound of her left leg that was negative for DVT but did show a lymph node in the left inguinal area presumably reactive. We've been using compression to her lower extremities eventually healed all her wounds on her bilateral calves but we were not able to get any form of compression stockings for her in spite of intense efforts of our staff. She returns today with reopening on the left leg did not the right she has several superficial areas anteriorly but an actual frank ulcer on the lateral left calf. This is about the size of a dime or smaller. She does not  really complain of pain. The patient has a history of PAD however arterial studies we did in November were normal. Her ABIs and Doppler waveforms were both within the normal limits. The patient has a history of MS. In discussing things with her today it would appear that the opening was noted 2 weeks ago according to the patient. We gave her Tubigrip stockings when she left the clinic 05/21/16; she has not yet had the duplex ultrasound of her thigh, this is booked for Thursday. The wound on the left lateral lower leg is improved 05/28/16; her duplex ultrasound of the left leg specifically her left thigh did not show a DVT however did show hypoechogenic enlarged left inguinal lymph nodes up to 6.9 x 3.7 x 6.8. This also showed in the one in November however maximal diameter was 6.2, at that point I thought these were reactive secondary to cellulitis however there is obviously a differential here. Her wound on the left lower leg is closed. She has a superficial open area on the base of her left fifth metatarsal head on the plantar foot. It looks as though she has some wrap injuries on the anterior leg 06/05/16; since she was last year she had a fall on 1/31. She was seen in the ER but sent back home. I think she is also been seen by her primary physician who picked up on the swelling and the lymphadenopathy. She is ordered a CT scan of the abdomen and pelvis however this will be without contrast because of stage IIIB renal insufficiency. Nevertheless this should be a start the workup to make sure there is not is more systemic  problem here. She will probably need consideration of a biopsy of the lymph node in her inguinal area, this would need general surgery. As far as her wounds are concern today the area on the left plantar foot is healed. She has a small weeping area on the left lower leg. The wrap injury from last week has largely Pardoxically there is a lot less edema in the left  thigh. Objective Constitutional Sitting or standing Blood Pressure is within target range for patient.. Pulse regular and within target range for patient.Marland Kitchen Respirations regular, non-labored and within target range.. Temperature is normal and within [facility], [name] H. ([id number]) the target range for the patient.. Patient's appearance is neat and clean. Appears in no acute distress. Well nourished and well developed.. Vitals Time Taken: 1:42 PM, Height: 63 in, Weight: 270 lbs, BMI: 47.8, Temperature: 97.7 F, Pulse: 81 bpm, Respiratory Rate: 20 breaths/min, Blood Pressure: 140/70 mmHg. Eyes Conjunctivae clear. No discharge.Marland Kitchen Respiratory Respiratory effort is easy and symmetric bilaterally. Rate is normal at rest and on room air.. Shallower and treated but no wheezing. Cardiovascular Heart rhythm and rate regular, without murmur or gallop.. I believe her dorsalis pedis pulses palpable but reduced the swelling in the foot. She is a stocking in the right leg. On the left leg large amount of edema mostly nonpitting. I am concerned about the viability of the skin underneath this.. Gastrointestinal (GI) Abdomen is soft and non-distended without masses or tenderness. Bowel sounds active in all quadrants.. No liver or spleen enlargement or tenderness.. Lymphatic Nonpalpable in the popliteal or inguinal area. No lymphadenopathy or glandular swelling noted in neck.. No lymphadenopathy or glandular swelling noted in axillae.. No lymphadenopathy or glandular swelling noted in groin.Marland Kitchen Psychiatric Patient appears depressed today.. General Notes: Wound exam; the patient only has superficial wounds remaining. On the left lateral leg a very small wound that is leaking edema fluid. Her wrap injury anteriorly from last week has mostly epithelialized although it is also leaking edema fluid. The area that was on her left foot has healed over [fifth metatarsal head]. Integumentary (Hair, Skin) Wound #10  status is Healed - Epithelialized. Original cause of wound was Trauma. The wound is located on the Left,Plantar Metatarsal head fifth. The wound measures 0cm length x 0cm width x 0cm depth; 0cm^2 area and 0cm^3 volume. Wound #11 status is Open. Original cause of wound was Pressure Injury. The wound is located on the Left,Anterior Lower Leg. The wound measures 2cm length x 0.2cm width x 0.1cm depth; 0.314cm^2 area and 0.031cm^3 volume. The wound is limited to skin breakdown. There is no tunneling or undermining noted. There is a large amount of serous drainage noted. The wound margin is flat and intact. There is large (67-100%) red granulation within the wound bed. There is no necrotic tissue within the wound bed. The periwound skin appearance did not exhibit: Callus, Crepitus, Excoriation, Induration, Rash, Scarring, Dry/Scaly, Maceration, Atrophie Blanche, Cyanosis, Ecchymosis, Hemosiderin Staining, Mottled, Pallor, Rubor, Erythema. Periwound temperature was noted as No Abnormality. [name], [name] ([id number]) Wound #9 status is Open. Original cause of wound was Shear/Friction. The wound is located on the Left,Lateral Lower Leg. The wound measures 0.5cm length x 0.6cm width x 0.1cm depth; 0.236cm^2 area and 0.024cm^3 volume. The wound is limited to skin breakdown. There is no tunneling or undermining noted. There is a large amount of serous drainage noted. The wound margin is distinct with the outline attached to the wound base. There is large (67-100%) pale granulation within the  wound bed. There is no necrotic tissue within the wound bed. The periwound skin appearance exhibited: Mottled. The periwound skin appearance did not exhibit: Callus, Crepitus, Excoriation, Induration, Rash, Scarring, Dry/Scaly, Maceration, Atrophie Blanche, Cyanosis, Ecchymosis, Hemosiderin Staining, Pallor, Rubor, Erythema. Periwound temperature was noted as No Abnormality. The periwound has tenderness on  palpation. Assessment Active Problems ICD-10 L97.212 - Non-pressure chronic ulcer of right calf with fat layer exposed L97.512 - Non-pressure chronic ulcer of other part of right foot with fat layer exposed G35 - Multiple sclerosis E66.01 - Morbid (severe) obesity due to excess calories F17.218 - Nicotine dependence, cigarettes, with other nicotine-induced disorders L04.3 - Acute lymphadenitis of lower limb Plan Wound Cleansing: Wound #11 Left,Anterior Lower Leg: Clean wound with Normal Saline. Wound #9 Left,Lateral Lower Leg: Clean wound with Normal Saline. Anesthetic: Wound #11 Left,Anterior Lower Leg: Topical Lidocaine 4% cream applied to wound bed prior to debridement Wound #9 Left,Lateral Lower Leg: Topical Lidocaine 4% cream applied to wound bed prior to debridement Primary Wound Dressing: Wound #11 Left,Anterior Lower Leg: Aquacel Ag Wound #9 Left,Lateral Lower Leg: Aquacel Ag Secondary Dressing: Wound #11 Left,Anterior Lower Leg: ABD pad [name], [name] H. ([id number]) Foam Wound #9 Left,Lateral Lower Leg: ABD pad Foam Dressing Change Frequency: Wound #11 Left,Anterior Lower Leg: Three times weekly Wound #9 Left,Lateral Lower Leg: Three times weekly Follow-up Appointments: Wound #11 Left,Anterior Lower Leg: Return Appointment in 1 week. Wound #9 Left,Lateral Lower Leg: Return Appointment in 1 week. Edema Control: Wound #11 Left,Anterior Lower Leg: Kerlix and Coban - Left Lower Extremity - do not wrap too tight Elevate legs to the level of the heart and pump ankles as often as possible Wound #9 Left,Lateral Lower Leg: Kerlix and Coban - Left Lower Extremity - do not wrap too tight Elevate legs to the level of the heart and pump ankles as often as possible Home Health: Wound #11 Left,Anterior Lower Leg: [place] Nurse may visit PRN to address patient s wound care needs. FACE TO FACE ENCOUNTER: MEDICARE and MEDICAID PATIENTS: I  certify that this patient is under my care and that I had a face-to-face encounter that meets the physician face-to-face encounter requirements with this patient on this date. The encounter with the patient was in whole or in part for the following MEDICAL CONDITION: (primary reason for [place]) MEDICAL NECESSITY: I certify, that based on my findings, NURSING services are a medically necessary home health service. HOME BOUND STATUS: I certify that my clinical findings support that this patient is homebound (i.e., Due to illness or injury, pt requires aid of supportive devices such as crutches, cane, wheelchairs, walkers, the use of special transportation or the assistance of another person to leave their place of residence. There is a normal inability to leave the home and doing so requires considerable and taxing effort. Other absences are for medical reasons / religious services and are infrequent or of short duration when for other reasons). If current dressing causes regression in wound condition, may D/C ordered dressing product/s and apply Normal Saline Moist Dressing daily until next [place] / Other MD appointment. [place] of regression in wound condition at [phone]. Please direct any NON-WOUND related issues/requests for orders to patient's Primary Care Physician Wound #9 Left,Lateral Lower Leg: [place] Nurse may visit PRN to address patient s wound care needs. FACE TO FACE ENCOUNTER: MEDICARE and MEDICAID PATIENTS: I certify that this patient is under my care and  that I had a face-to-face encounter that meets the physician face-to-face encounter requirements with this patient on this date. The encounter with the patient was in whole or in part for the following MEDICAL CONDITION: (primary reason for [place]) MEDICAL NECESSITY: I certify, that based on my findings, NURSING services are a medically  necessary home health service. HOME BOUND STATUS: I certify that my clinical findings support that this patient is homebound (i.e., Due to illness or injury, pt requires aid of supportive devices such as crutches, cane, wheelchairs, walkers, the use of special transportation or the assistance of another person to leave their place of residence. There is a normal inability to leave the home and doing so requires considerable and taxing effort. Other absences are NYALISE, BANUELOS. ([id number]) for medical reasons / religious services and are infrequent or of short duration when for other reasons). If current dressing causes regression in wound condition, may D/C ordered dressing product/s and apply Normal Saline Moist Dressing daily until next [place] / Other MD appointment. [place] of regression in wound condition at [phone]. Please direct any NON-WOUND related issues/requests for orders to patient's Primary Care Physician I put her back into a 2compression today I simply think she has too much edema and is at risk of further skin breakdown I had plan to order an MRI of the abdomen and pelvis however her primary physician ordered a CT scan all Pasteur her. Ultimately I think this lymph node is probably going to need to be biopsied if the abdominal imaging is unhelpful. I had also wanted to see her pelvic veins to make sure there was not an internal or external compression Electronic Signature(s) Signed: 06/05/2016 4:28:51 PM By: Linton Ham MD Entered By: Linton Ham on 06/05/2016 14:58:32 [name] ([id number]) -------------------------------------------------------------------------------- SuperBill Details Patient Name: [name] Date of Service: 06/05/2016 Medical Record Patient Account Number: [id number] Number: Treating RN: Montey Hora 1950-05-11 (65 y.o. Other Clinician: Date of Birth/Sex: Female) Treating Colton Tassin,  [place] Primary Care Provider: LADA, MELINDA Provider/Extender: G Referring Provider: LADA, MELINDA Weeks in Treatment: 28 Diagnosis Coding ICD-10 Codes Code Description L97.212 Non-pressure chronic ulcer of right calf with fat layer exposed L97.512 Non-pressure chronic ulcer of other part of right foot with fat layer exposed G35 Multiple sclerosis E66.01 Morbid (severe) obesity due to excess calories F17.218 Nicotine dependence, cigarettes, with other nicotine-induced disorders L04.3 Acute lymphadenitis of lower limb Facility Procedures CPT4 Code: [id number] Description: 99214 - WOUND CARE VISIT-LEV 4 EST PT Modifier: Quantity: 1 Physician Procedures CPT4 Code Description: [id number] - WC PHYS LEVEL 3 - EST PT ICD-10 Description Diagnosis L97.212 Non-pressure chronic ulcer of right calf with fat la L97.512 Non-pressure chronic ulcer of other part of right fo Modifier: yer exposed ot with fat lay Quantity: 1 er exposed Electronic Signature(s) Signed: 06/05/2016 4:28:51 PM By: Linton Ham MD Entered By: Linton Ham on 06/05/2016 14:59:10

## 2016-06-06 NOTE — Progress Notes (Signed)
[name], [name] ([id number]) Visit Report for 06/05/2016 Arrival Information Details Patient Name: [name], [name] Date of Service: 06/05/2016 1:30 PM Medical Record Number: [id number] Patient Account Number: [id number] Date of Birth/Sex: [name] (66 y.o. Female) Treating RN: [name] Primary Care [name]: [name], [name] Other Clinician: Referring [name]: [name], [name] Treating [name]/Extender: [name] in Treatment: 28 Visit Information History Since Last Visit Added or deleted any medications: No Patient Arrived: Wheel Chair Any [name] allergies or adverse reactions: No Arrival Time: 13:41 Had a fall or experienced change in No activities of daily living that may affect Accompanied By: self risk of falls: Transfer Assistance: None Signs or symptoms of abuse/neglect since last No Patient Identification Verified: Yes visito Secondary Verification Process Yes Hospitalized since last visit: No Completed: Has Dressing in Place as Prescribed: Yes Patient Requires Transmission-Based No Has Compression in Place as Prescribed: Yes Precautions: Pain Present Now: Yes Patient Has Alerts: Yes Electronic Signature(s) Signed: 06/05/2016 5:09:18 PM By: [name] Entered By: [name] on 06/05/2016 13:41:53 [name], [name] ([id number]) -------------------------------------------------------------------------------- Clinic Level of Care Assessment Details Patient Name: [name] Date of Service: 06/05/2016 1:30 PM Medical Record Number: [id number] Patient Account Number: [id number] Date of Birth/Sex: [name] (66 y.o. Female) Treating RN: [name] Primary Care [name]: [name], [name] Other Clinician: Referring [name]: [name], [name] Treating [name]/Extender: [name] in Treatment: 28 Clinic Level of Care Assessment Items TOOL 4 Quantity Score []  - Use when only an EandM is performed on FOLLOW-UP visit 0 ASSESSMENTS - Nursing  Assessment / Reassessment X - Reassessment of Co-morbidities (includes updates in patient status) 1 10 X - Reassessment of Adherence to Treatment Plan 1 5 ASSESSMENTS - Wound and Skin Assessment / Reassessment []  - Simple Wound Assessment / Reassessment - one wound 0 X - Complex Wound Assessment / Reassessment - multiple wounds 2 5 []  - Dermatologic / Skin Assessment (not related to wound area) 0 ASSESSMENTS - Focused Assessment X - Circumferential Edema Measurements - multi extremities 1 5 []  - Nutritional Assessment / Counseling / Intervention 0 X - Lower Extremity Assessment (monofilament, tuning fork, pulses) 1 5 []  - Peripheral Arterial Disease Assessment (using hand held doppler) 0 ASSESSMENTS - Ostomy and/or Continence Assessment and Care []  - Incontinence Assessment and Management 0 []  - Ostomy Care Assessment and Management (repouching, etc.) 0 PROCESS - Coordination of Care X - Simple Patient / Family Education for ongoing care 1 15 []  - Complex (extensive) Patient / Family Education for ongoing care 0 []  - Staff obtains Programmer, systems, Records, Test Results / Process Orders 0 []  - Staff telephones HHA, Nursing Homes / Clarify orders / etc 0 []  - Routine Transfer to another Facility (non-emergent condition) 0 [name], [name] H. ([id number]) []  - Routine Hospital Admission (non-emergent condition) 0 []  - [name] Admissions / Biomedical engineer / Ordering NPWT, Apligraf, etc. 0 []  - Emergency Hospital Admission (emergent condition) 0 X - Simple Discharge Coordination 1 10 []  - Complex (extensive) Discharge Coordination 0 PROCESS - Special Needs []  - Pediatric / Minor Patient Management 0 []  - Isolation Patient Management 0 []  - Hearing / Language / Visual special needs 0 []  - Assessment of Community assistance (transportation, D/C planning, etc.) 0 []  - Additional assistance / Altered mentation 0 []  - Support Surface(s) Assessment (bed, cushion, seat, etc.) 0 INTERVENTIONS - Wound  Cleansing / Measurement []  - Simple Wound Cleansing - one wound 0 X - Complex Wound Cleansing - multiple wounds 2 5 X - Wound  Imaging (photographs - any number of wounds) 1 5 []  - Wound Tracing (instead of photographs) 0 []  - Simple Wound Measurement - one wound 0 X - Complex Wound Measurement - multiple wounds 2 5 INTERVENTIONS - Wound Dressings []  - Small Wound Dressing one or multiple wounds 0 X - Medium Wound Dressing one or multiple wounds 2 15 []  - Large Wound Dressing one or multiple wounds 0 []  - Application of Medications - topical 0 []  - Application of Medications - injection 0 INTERVENTIONS - Miscellaneous []  - External ear exam 0 [name], [name] H. ([id number]) []  - Specimen Collection (cultures, biopsies, blood, body fluids, etc.) 0 []  - Specimen(s) / Culture(s) sent or taken to Lab for analysis 0 []  - Patient Transfer (multiple staff / Harrel Lemon Lift / Similar devices) 0 []  - Simple Staple / Suture removal (25 or less) 0 []  - Complex Staple / Suture removal (26 or more) 0 []  - Hypo / Hyperglycemic Management (close monitor of Blood Glucose) 0 []  - Ankle / Brachial Index (ABI) - do not check if billed separately 0 X - Vital Signs 1 5 Has the patient been seen at the hospital within the last three years: Yes Total Score: 120 Level Of Care: [name]/Established - Level 4 Electronic Signature(s) Signed: 06/05/2016 5:09:18 PM By: [name] Entered By: [name] on 06/05/2016 14:26:01 [name] ([id number]) -------------------------------------------------------------------------------- Encounter Discharge Information Details Patient Name: [name] Date of Service: 06/05/2016 1:30 PM Medical Record Number: [id number] Patient Account Number: [id number] Date of Birth/Sex: [name] (66 y.o. Female) Treating RN: [name] Primary Care Kinesha Auten: [name], [name] Other Clinician: Referring [name]: [name], [name] Treating [name]/Extender: [name] in  Treatment: 28 Encounter Discharge Information Items Discharge Pain Level: 0 Discharge Condition: Stable Ambulatory Status: Wheelchair Discharge Destination: Home Transportation: Private Auto Accompanied By: self Schedule Follow-up Appointment: Yes Medication Reconciliation completed No and provided to Patient/Care Arvine Clayburn: Provided on Clinical Summary of Care: 06/05/2016 Form Type Recipient Paper Patient BW Electronic Signature(s) Signed: 06/05/2016 2:27:11 PM By: [name] Previous Signature: 06/05/2016 2:15:50 PM Version By: Ruthine Dose Entered By: [name] on 06/05/2016 14:27:11 [name] ([id number]) -------------------------------------------------------------------------------- Lower Extremity Assessment Details Patient Name: [name] Date of Service: 06/05/2016 1:30 PM Medical Record Number: [id number] Patient Account Number: [id number] Date of Birth/Sex: [name] (66 y.o. Female) Treating RN: [name] Primary Care Giavana Rooke: [name], [place] Other Clinician: Referring Sebastain Fishbaugh: [name], [place] Treating Zareen Jamison/Extender: Ricard Dillon Weeks in Treatment: 28 Edema Assessment Assessed: [Left: No] [Right: No] Edema: [Left: Ye] [Right: s] Calf Left: Right: Point of Measurement: 33 cm From Medial Instep 40.5 cm cm Ankle Left: Right: Point of Measurement: 11 cm From Medial Instep 22.4 cm cm Vascular Assessment Pulses: Dorsalis Pedis Palpable: [Left:Yes] Posterior Tibial Extremity colors, hair growth, and conditions: Extremity Color: [Left:Normal] Hair Growth on Extremity: [Left:No] Temperature of Extremity: [Left:Warm] Capillary Refill: [Left:< 3 seconds] Electronic Signature(s) Signed: 06/05/2016 2:24:23 PM By: [name] Entered By: [name] on 06/05/2016 14:24:23 [name], [name] ([id number]) -------------------------------------------------------------------------------- Multi Wound Chart Details Patient Name: [name] Date of Service: 06/05/2016 1:30 PM Medical Record Number: [id number] Patient Account Number: [id number] Date of Birth/Sex: [name] (66 y.o. Female) Treating RN: [name] Primary Care Kieon Lawhorn: [name], [place] Other Clinician: Referring Silva Aamodt: [name], [place] Treating Aniesa Boback/Extender: Ricard Dillon Weeks in Treatment: 28 Vital Signs Height(in): 63 Pulse(bpm): 81 Weight(lbs): 270 Blood Pressure 140/70 (mmHg): Body Mass Index(BMI): 48 Temperature(F): 97.7 Respiratory Rate 20 (breaths/min): Photos: Wound Location: Left, Plantar  Metatarsal Left Lower Leg - Anterior Left Lower Leg - Lateral head fifth Wounding Event: Trauma Pressure Injury Shear/Friction Primary Etiology: Trauma, Other Pressure Ulcer Lymphedema Comorbid History: N/A Cataracts, Chronic Cataracts, Chronic Obstructive Pulmonary Obstructive Pulmonary Disease (COPD), Sleep Disease (COPD), Sleep Apnea, Hypertension, Apnea, Hypertension, Osteoarthritis, Neuropathy Osteoarthritis, Neuropathy Date Acquired: 05/26/2016 06/04/2016 05/09/2016 Weeks of Treatment: 1 0 3 Wound Status: Healed - Epithelialized Open Open Measurements L x W x D 0x0x0 2x0.2x0.1 0.5x0.6x0.1 (cm) Area (cm) : 0 0.314 0.236 Volume (cm) : 0 0.031 0.024 % Reduction in Area: 100.00% 0.00% 69.90% % Reduction in Volume: 100.00% 0.00% 69.60% Classification: Partial Thickness Category/Stage II Partial Thickness Exudate Amount: N/A Large Large Exudate Type: N/A Serous Serous Exudate Color: N/A amber amber Wound Margin: N/A Flat and Intact Distinct, outline attached [name], [name] H. ([id number]) Granulation Amount: N/A Large (67-100%) Large (67-100%) Granulation Quality: N/A Red Pale Necrotic Amount: N/A None Present (0%) None Present (0%) Epithelialization: N/A Medium (34-66%) Large (67-100%) Periwound Skin Texture: No Abnormalities Noted Excoriation: No Excoriation: No Induration: No Induration: No Callus: No Callus: No Crepitus:  No Crepitus: No Rash: No Rash: No Scarring: No Scarring: No Periwound Skin No Abnormalities Noted Maceration: No Maceration: No Moisture: Dry/Scaly: No Dry/Scaly: No Periwound Skin Color: No Abnormalities Noted Atrophie Blanche: No Mottled: Yes Cyanosis: No Atrophie Blanche: No Ecchymosis: No Cyanosis: No Erythema: No Ecchymosis: No Hemosiderin Staining: No Erythema: No Mottled: No Hemosiderin Staining: No Pallor: No Pallor: No Rubor: No Rubor: No Temperature: N/A No Abnormality No Abnormality Tenderness on No No Yes Palpation: Wound Preparation: N/A Ulcer Cleansing: Ulcer Cleansing: Other: Rinsed/Irrigated with surg scrub and water Saline Topical Anesthetic Topical Anesthetic Applied: Other: lidocaine Applied: Other: likdocaine 4% 4% Treatment Notes Wound #11 (Left, Anterior Lower Leg) 1. Cleansed with: Clean wound with Normal Saline 4. Dressing Applied: Aquacel Ag 5. Secondary Dressing Applied ABD Pad Foam 7. Secured with Other (specify in notes) Notes kerlix and coban from toes to 3cm below the knee Wound #9 (Left, Lateral Lower Leg) 1. Cleansed with: Clean wound with Normal Saline Traweek, Lachlyn H. ([id number]) 4. Dressing Applied: Aquacel Ag 5. Secondary Dressing Applied ABD Pad Foam 7. Secured with Other (specify in notes) Notes kerlix and coban from toes to 3cm below the knee Electronic Signature(s) Signed: 06/05/2016 4:28:51 PM By: Linton Ham MD Entered By: Linton Ham on 06/05/2016 14:50:55 [name] -------------------------------------------------------------------------------- Multi-Disciplinary Care Plan Details Patient Name: [name] Date of Service: 06/05/2016 1:30 PM Medical Record Number: [id number] Patient Account Number: [id number] Date of Birth/Sex: [name] (65 y.o. Female) Treating RN: [name] Primary Care Phu Record: [name], [place] Other Clinician: Referring Najat Olazabal: [name],  [place] Treating Sherina Stammer/Extender: [name] in Treatment: 28 Active Inactive ` Abuse / Safety / Falls / Self Care Management Nursing Diagnoses: Impaired physical mobility Potential for falls Goals: Patient will remain injury free Date Initiated: 11/20/2015 Target Resolution Date: 05/28/2016 Goal Status: Active Interventions: Assess fall risk on admission and as needed Notes: ` Nutrition Nursing Diagnoses: Imbalanced nutrition Goals: Patient/caregiver agrees to and verbalizes understanding of need to use nutritional supplements and/or vitamins as prescribed Date Initiated: 11/20/2015 Target Resolution Date: 05/28/2016 Goal Status: Active Interventions: Provide education on nutrition Treatment Activities: Education provided on Nutrition : 05/14/2016 Notes: [name], [name] Orientation to the Wound Care Program Nursing Diagnoses: Knowledge deficit related to the wound healing center program Goals: Patient/caregiver will verbalize understanding of the [place] Date Initiated: 11/20/2015 Target Resolution Date: 05/28/2016 Goal Status: Active  Interventions: Provide education on orientation to the wound center Notes: ` Wound/Skin Impairment Nursing Diagnoses: Impaired tissue integrity Goals: Patient will demonstrate a reduced rate of smoking or cessation of smoking Date Initiated: 11/20/2015 Target Resolution Date: 06/18/2016 Goal Status: Active Ulcer/skin breakdown will heal within 14 weeks Date Initiated: 11/20/2015 Target Resolution Date: 07/16/2016 Goal Status: Active Interventions: Assess patient/caregiver ability to obtain necessary supplies Provide education on smoking Notes: Electronic Signature(s) Signed: 06/05/2016 5:09:18 PM By: [name] Entered By: [name] on 06/05/2016 13:59:22 [name], [name] ([id number]) -------------------------------------------------------------------------------- Pain  Assessment Details Patient Name: [name] Date of Service: 06/05/2016 1:30 PM Medical Record Number: [id number] Patient Account Number: [id number] Date of Birth/Sex: [birth year] (66 y.o. Female) Treating RN: [name] Primary Care Reisha Wos: [name], [place] Other Clinician: Referring Quinten Allerton: [name], [place] Treating Shravya Wickwire/Extender: [name] in Treatment: 28 Active Problems Location of Pain Severity and Description of Pain Patient Has Paino Yes Site Locations Pain Location: Pain in Ulcers With Dressing Change: Yes Duration of the Pain. Constant / Intermittento Constant Pain Management and Medication Current Pain Management: Notes Topical or injectable lidocaine is offered to patient for acute pain when surgical debridement is performed. If needed, Patient is instructed to use over the counter pain medication for the following 24-48 hours after debridement. Wound care MDs do not prescribed pain medications. Patient has chronic pain or uncontrolled pain. Patient has been instructed to make an appointment with their Primary Care Physician for pain management. Electronic Signature(s) Signed: 06/05/2016 5:09:18 PM By: [name] Entered By: [name] on 06/05/2016 13:42:08 [name] ([id number]) -------------------------------------------------------------------------------- Patient/Caregiver Education Details Patient Name: [name] Date of Service: 06/05/2016 1:30 PM Medical Record Patient Account Number: [id number] Number: Treating RN: [name] [name] (65 y.o. Other Clinician: Date of Birth/Gender: Female) Treating ROBSON, [place] Primary Care Physician: [name], MELINDA Physician/Extender: G Referring Physician: LADA, MELINDA Weeks in Treatment: 70 Education Assessment Education Provided To: Patient Education Topics Provided Venous: Handouts: Other: leg elevation Methods: Explain/Verbal Responses: State content  correctly Electronic Signature(s) Signed: 06/05/2016 5:09:18 PM By: [name] Entered By: [name] on 06/05/2016 14:27:33 [name], [name] ([id number]) -------------------------------------------------------------------------------- Wound Assessment Details Patient Name: [name] Date of Service: 06/05/2016 1:30 PM Medical Record Number: [id number] Patient Account Number: [id number] Date of Birth/Sex: [birth year] (66 y.o. Female) Treating RN: [name] Primary Care Merl Guardino: [name], [place] Other Clinician: Referring Shaunessy Dobratz: [name], [place] Treating Thiago Ragsdale/Extender: Ricard Dillon Weeks in Treatment: 28 Wound Status Wound Number: 10 Primary Etiology: Trauma, Other Wound Location: Left, Plantar Metatarsal head Wound Status: Healed - Epithelialized fifth Wounding Event: Trauma Date Acquired: 05/26/2016 Weeks Of Treatment: 1 Clustered Wound: No Photos Photo Uploaded By: [name] on 06/05/2016 13:59:09 Wound Measurements Length: (cm) 0 % Reduction Width: (cm) 0 % Reduction Depth: (cm) 0 Area: (cm) 0 Volume: (cm) 0 in Area: 100% in Volume: 100% Wound Description Classification: Partial Thickness Periwound Skin Texture Texture Color No Abnormalities Noted: No No Abnormalities Noted: No Moisture No Abnormalities Noted: No Electronic Signature(s) Signed: 06/05/2016 5:09:18 PM By: Naaman Plummer, [name] ([id number]) Entered By: [name] on 06/05/2016 13:53:29 [name] ([id number]) -------------------------------------------------------------------------------- Wound Assessment Details Patient Name: [name] Date of Service: 06/05/2016 1:30 PM Medical Record Number: [id number] Patient Account Number: [id number] Date of Birth/Sex: [birth year] (66 y.o. Female) Treating RN: [name] Primary Care Diania Co: [name], [place] Other Clinician: Referring Kc Summerson: [name], [place] Treating Xoey Warmoth/Extender: Ricard Dillon Weeks  in Treatment: 28 Wound Status Wound Number: 11 Primary  Pressure Ulcer Etiology: Wound Location: Left Lower Leg - Anterior Wound Open Wounding Event: Pressure Injury Status: Date Acquired: 06/04/2016 Comorbid Cataracts, Chronic Obstructive Weeks Of Treatment: 0 History: Pulmonary Disease (COPD), Sleep Clustered Wound: No Apnea, Hypertension, Osteoarthritis, Neuropathy Photos Wound Measurements Length: (cm) 2 Width: (cm) 0.2 Depth: (cm) 0.1 Area: (cm) 0.314 Volume: (cm) 0.031 % Reduction in Area: 0% % Reduction in Volume: 0% Epithelialization: Medium (34-66%) Tunneling: No Undermining: No Wound Description Classification: Category/Stage II Wound Margin: Flat and Intact Exudate Amount: Large Exudate Type: Serous Exudate Color: amber Foul Odor After Cleansing: No Slough/Fibrino No Wound Bed Granulation Amount: Large (67-100%) Exposed Structure Granulation Quality: Red Fascia Exposed: No Necrotic Amount: None Present (0%) Fat Layer (Subcutaneous Tissue) Exposed: No Tendon Exposed: No Lund, Keysha H. ([id number]) Muscle Exposed: No Joint Exposed: No Bone Exposed: No Limited to Skin Breakdown Periwound Skin Texture Texture Color No Abnormalities Noted: No No Abnormalities Noted: No Callus: No Atrophie Blanche: No Crepitus: No Cyanosis: No Excoriation: No Ecchymosis: No Induration: No Erythema: No Rash: No Hemosiderin Staining: No Scarring: No Mottled: No Pallor: No Moisture Rubor: No No Abnormalities Noted: No Dry / Scaly: No Temperature / Pain Maceration: No Temperature: No Abnormality Wound Preparation Ulcer Cleansing: Rinsed/Irrigated with Saline Topical Anesthetic Applied: Other: likdocaine 4%, Treatment Notes Wound #11 (Left, Anterior Lower Leg) 1. Cleansed with: Clean wound with Normal Saline 4. Dressing Applied: Aquacel Ag 5. Secondary Dressing Applied ABD Pad Foam 7. Secured with Other (specify in notes) Notes kerlix and coban  from toes to 3cm below the knee Electronic Signature(s) Signed: 06/05/2016 5:09:18 PM By: [name] Entered By: [name] on 06/05/2016 13:58:09 [name] ([id number]) -------------------------------------------------------------------------------- Wound Assessment Details Patient Name: [name] Date of Service: 06/05/2016 1:30 PM Medical Record Number: [id number] Patient Account Number: [id number] Date of Birth/Sex: [name] (66 y.o. Female) Treating RN: [name] Primary Care Velmer Broadfoot: [name], [place] Other Clinician: Referring Wilhelmina Hark: [name], [place] Treating Madalynne Gutmann/Extender: Ricard Dillon Weeks in Treatment: 28 Wound Status Wound Number: 9 Primary Lymphedema Etiology: Wound Location: Left Lower Leg - Lateral Wound Open Wounding Event: Shear/Friction Status: Date Acquired: 05/09/2016 Comorbid Cataracts, Chronic Obstructive Weeks Of Treatment: 3 History: Pulmonary Disease (COPD), Sleep Clustered Wound: No Apnea, Hypertension, Osteoarthritis, Neuropathy Photos Wound Measurements Length: (cm) 0.5 Width: (cm) 0.6 Depth: (cm) 0.1 Area: (cm) 0.236 Volume: (cm) 0.024 % Reduction in Area: 69.9% % Reduction in Volume: 69.6% Epithelialization: Large (67-100%) Tunneling: No Undermining: No Wound Description Classification: Partial Thickness Wound Margin: Distinct, outline attached Exudate Amount: Large Exudate Type: Serous Exudate Color: amber Foul Odor After Cleansing: No Wound Bed Granulation Amount: Large (67-100%) Exposed Structure Granulation Quality: Pale Fascia Exposed: No Necrotic Amount: None Present (0%) Fat Layer (Subcutaneous Tissue) Exposed: No Tendon Exposed: No Lamison, [name] H. ([id number]) Muscle Exposed: No Joint Exposed: No Bone Exposed: No Limited to Skin Breakdown Periwound Skin Texture Texture Color No Abnormalities Noted: No No Abnormalities Noted: No Callus: No Atrophie Blanche: No Crepitus: No Cyanosis:  No Excoriation: No Ecchymosis: No Induration: No Erythema: No Rash: No Hemosiderin Staining: No Scarring: No Mottled: Yes Pallor: No Moisture Rubor: No No Abnormalities Noted: No Dry / Scaly: No Temperature / Pain Maceration: No Temperature: No Abnormality Tenderness on Palpation: Yes Wound Preparation Ulcer Cleansing: Other: surg scrub and water, Topical Anesthetic Applied: Other: lidocaine 4%, Treatment Notes Wound #9 (Left, Lateral Lower Leg) 1. Cleansed with: Clean wound with Normal Saline 4. Dressing Applied: Aquacel Ag 5. Secondary Dressing Applied ABD Pad Foam 7. Secured with Other (  specify in notes) Notes kerlix and coban from toes to 3cm below the knee Electronic Signature(s) Signed: 06/05/2016 5:09:18 PM By: [name] Entered By: [name] on 06/05/2016 13:58:36 [name] -------------------------------------------------------------------------------- Vitals Details Patient Name: [name] Date of Service: 06/05/2016 1:30 PM Medical Record Number: [id number] Patient Account Number: [id number] Date of Birth/Sex: [birth year] (65 y.o. Female) Treating RN: [name] Primary Care Tayah Idrovo: [name], [place] Other Clinician: Referring Dineen Conradt: [name], [place] Treating Betzabe Bevans/Extender: Ricard Dillon Weeks in Treatment: 28 Vital Signs Time Taken: 13:42 Temperature (F): 97.7 Height (in): 63 Pulse (bpm): 81 Weight (lbs): 270 Respiratory Rate (breaths/min): 20 Body Mass Index (BMI): 47.8 Blood Pressure (mmHg): 140/70 Reference Range: 80 - 120 mg / dl Electronic Signature(s) Signed: 06/05/2016 5:09:18 PM By: [name] Entered By: [name] on 06/05/2016 13:43:01

## 2016-06-07 ENCOUNTER — Ambulatory Visit (INDEPENDENT_AMBULATORY_CARE_PROVIDER_SITE_OTHER): Payer: Medicare Other | Admitting: Family Medicine

## 2016-06-07 ENCOUNTER — Encounter: Payer: Self-pay | Admitting: Family Medicine

## 2016-06-07 DIAGNOSIS — R296 Repeated falls: Secondary | ICD-10-CM | POA: Diagnosis not present

## 2016-06-07 DIAGNOSIS — R590 Localized enlarged lymph nodes: Secondary | ICD-10-CM | POA: Diagnosis not present

## 2016-06-07 DIAGNOSIS — F3341 Major depressive disorder, recurrent, in partial remission: Secondary | ICD-10-CM

## 2016-06-07 DIAGNOSIS — L97219 Non-pressure chronic ulcer of right calf with unspecified severity: Secondary | ICD-10-CM | POA: Diagnosis not present

## 2016-06-07 DIAGNOSIS — J449 Chronic obstructive pulmonary disease, unspecified: Secondary | ICD-10-CM | POA: Diagnosis not present

## 2016-06-07 DIAGNOSIS — Z792 Long term (current) use of antibiotics: Secondary | ICD-10-CM | POA: Diagnosis not present

## 2016-06-07 DIAGNOSIS — G35 Multiple sclerosis: Secondary | ICD-10-CM | POA: Diagnosis not present

## 2016-06-07 DIAGNOSIS — I70249 Atherosclerosis of native arteries of left leg with ulceration of unspecified site: Secondary | ICD-10-CM | POA: Diagnosis not present

## 2016-06-07 DIAGNOSIS — L97929 Non-pressure chronic ulcer of unspecified part of left lower leg with unspecified severity: Secondary | ICD-10-CM | POA: Diagnosis not present

## 2016-06-07 DIAGNOSIS — L97419 Non-pressure chronic ulcer of right heel and midfoot with unspecified severity: Secondary | ICD-10-CM | POA: Diagnosis not present

## 2016-06-07 DIAGNOSIS — L03119 Cellulitis of unspecified part of limb: Secondary | ICD-10-CM | POA: Diagnosis not present

## 2016-06-07 DIAGNOSIS — L03116 Cellulitis of left lower limb: Secondary | ICD-10-CM | POA: Diagnosis not present

## 2016-06-07 DIAGNOSIS — I70234 Atherosclerosis of native arteries of right leg with ulceration of heel and midfoot: Secondary | ICD-10-CM | POA: Diagnosis not present

## 2016-06-07 DIAGNOSIS — I70232 Atherosclerosis of native arteries of right leg with ulceration of calf: Secondary | ICD-10-CM | POA: Diagnosis not present

## 2016-06-07 DIAGNOSIS — R339 Retention of urine, unspecified: Secondary | ICD-10-CM | POA: Diagnosis not present

## 2016-06-07 DIAGNOSIS — I10 Essential (primary) hypertension: Secondary | ICD-10-CM | POA: Diagnosis not present

## 2016-06-07 NOTE — Assessment & Plan Note (Signed)
Left leg cared for by wound care center; may be the cause of lymph node swelling but need scan to r/o malignancy

## 2016-06-07 NOTE — Progress Notes (Signed)
BP 114/62   Pulse (!) 59   Temp 98.1 F (36.7 C) (Oral)   Resp 16   SpO2 97%   Cannot weigh today  Subjective:    Patient ID: [name], female    DOB: 06/26/1950, 66 y.o.   MRN: [id number]  HPI: [name] is a 66 y.o. female  Chief Complaint  Patient presents with  . FL2   FL-2 form filled out Currently at home Seeing wound care doctor and left leg stays bigger than the right Has been swollwn since falling in October Multiple falls; she thinks it is the strength in her legs She is lazy and doesn't do anything; "I just sit"; not doing all the exercises she should She thinks she is in a MS flare; she has an appt with Dr. Manuella Ghazi soon, Feb 13th at 2:30 pm She is really weak; can't stand by herself for even a minute; falling and EMS is coming to the house to pick her off the floor She is willing to go back to rehab for 30 days for more PT No aide coming out to help her a few days a week; she had a bath girl and she just soaped up a rag and handed it to her  She was in the ER on May 29, 2016; no LOC US done in the ER FINDINGS: Normal compressibility of the common femoral, superficial femoral, and popliteal veins. Limited evaluation of calf veins due to overlying bandages. No filling defects to suggest DVT on grayscale or color Doppler imaging. Doppler waveforms show normal direction of venous flow, normal respiratory phasicity and response to augmentation. There is an enlarged hypoechoic left inguinal lymph node 6.9 x 3.7 x 6.8 cm. Survey views of the contralateral common femoral vein are unremarkable.  IMPRESSION: 1. No evidence of  lower extremity deep vein thrombosis, left. 2. Left inguinal lymphadenopathy. Differential diagnosis: Reactive adenopathy, lymphoma, metastatic disease.   Electronically Signed   By: Lucrezia Europe M.D.   On: 05/24/2016 16:01 ------------------------------------ She has not really noticed No pain in the pelvic area She is having bad  yucky discharge from her vagina; last pap smear was... She laughed... Maybe 4-5 years ago; she remembers an abnormal pap smear years ago, repeat was normal; no abnormals since then No blood in the urine No night sweats  Depression screen [facility] 2/9 06/07/2016 12/22/2015 11/08/2015 04/18/2015 01/13/2015  Decreased Interest 0 3 1 1 1   Down, Depressed, Hopeless 1 3 3 2 1   PHQ - 2 Score 1 6 4 3 2   Altered sleeping - 0 1 2 1   Tired, decreased energy - 3 3 1 2   Change in appetite - 0 3 0 0  Feeling bad or failure about yourself  - 3 2 0 0  Trouble concentrating - 3 3 1  0  Moving slowly or fidgety/restless - 0 0 0 1  Suicidal thoughts - 0 0 0 -  PHQ-9 Score - 15 16 7 6   Difficult doing work/chores - Very difficult Somewhat difficult - -   Relevant past medical, surgical, family and social history reviewed Past Medical History:  Diagnosis Date  . Anxiety   . COPD (chronic obstructive pulmonary disease) ([place])   . Depression   . Hypertension   . Kyphoscoliosis and scoliosis 11/26/2011  . Morbid obesity ([place]) 01/05/2016  . Multiple sclerosis ([place])   . Neuromuscular disorder ([place])   . Peripheral vascular disease of lower extremity with ulceration ([place]) 11/08/2015  . Skin  ulcer ([place]) 11/08/2015   Past Surgical History:  Procedure Laterality Date  . BACK SURGERY N/A 2002  . TONSILLECTOMY AND ADENOIDECTOMY    . TUBAL LIGATION     Family History  Problem Relation Age of Onset  . COPD Mother   . Diabetes Mother   . Alcohol abuse Father   . Kidney disease Father    Social History  Substance Use Topics  . Smoking status: Former Smoker    Types: Cigarettes  . Smokeless tobacco: Never Used  . Alcohol use 0.0 oz/week     Comment: Pt states occasionally (liquor)   Interim medical history since last visit reviewed. Allergies and medications reviewed  Review of Systems Per HPI unless specifically indicated above     Objective:    BP 114/62   Pulse (!) 59   Temp 98.1 F (36.7 C) (Oral)    Resp 16   SpO2 97%   Wt Readings from Last 3 Encounters:  05/29/16 280 lb (127 kg)  02/28/16 287 lb 5 oz (130.3 kg)  02/01/16 268 lb (121.6 kg)    Physical Exam  Constitutional: She appears well-developed and well-nourished. No distress.  Unable to weigh today; obese  Eyes: EOM are normal. No scleral icterus.  Neck: No thyromegaly present.  Cardiovascular: Normal rate and regular rhythm.   Pulmonary/Chest: Effort normal and breath sounds normal.  Abdominal: She exhibits no distension.  Genitourinary:  Genitourinary Comments: Unable to get on the exam table to do a pelvic exam  Musculoskeletal:  Both legs wrapped in oona boot-type material  Lymphadenopathy:       Left: Inguinal (large palpable mass LEFT inguinal area) adenopathy present.  Skin: No pallor.  Psychiatric: Her behavior is normal. Judgment and thought content normal. Her mood appears not anxious. She exhibits a depressed mood.   Results for orders placed or performed during the hospital encounter of 05/29/16  CBC with Differential/Platelet  Result Value Ref Range   WBC 5.9 3.6 - 11.0 K/uL   RBC 4.01 3.80 - 5.20 MIL/uL   Hemoglobin 11.5 (L) 12.0 - 16.0 g/dL   HCT 33.8 (L) 35.0 - 47.0 %   MCV 84.2 80.0 - 100.0 fL   MCH 28.8 26.0 - 34.0 pg   MCHC 34.2 32.0 - 36.0 g/dL   RDW 15.0 (H) 11.5 - 14.5 %   Platelets 241 150 - 440 K/uL   Neutrophils Relative % 66 %   Neutro Abs 3.9 1.4 - 6.5 K/uL   Lymphocytes Relative 17 %   Lymphs Abs 1.0 1.0 - 3.6 K/uL   Monocytes Relative 10 %   Monocytes Absolute 0.6 0.2 - 0.9 K/uL   Eosinophils Relative 5 %   Eosinophils Absolute 0.3 0 - 0.7 K/uL   Basophils Relative 2 %   Basophils Absolute 0.1 0 - 0.1 K/uL  Basic metabolic panel  Result Value Ref Range   Sodium 136 135 - 145 mmol/L   Potassium 3.5 3.5 - 5.1 mmol/L   Chloride 102 101 - 111 mmol/L   CO2 27 22 - 32 mmol/L   Glucose, Bld 115 (H) 65 - 99 mg/dL   BUN 22 (H) 6 - 20 mg/dL   Creatinine, Ser 1.36 (H) 0.44 - 1.00  mg/dL   Calcium 8.8 (L) 8.9 - 10.3 mg/dL   GFR calc non Af Amer 40 (L) >60 mL/min   GFR calc Af Amer 46 (L) >60 mL/min   Anion gap 7 5 - 15  Urinalysis, Complete w Microscopic  Result Value Ref Range   Color, Urine STRAW (A) YELLOW   APPearance CLEAR (A) CLEAR   Specific Gravity, Urine 1.005 1.005 - 1.030   pH 6.0 5.0 - 8.0   Glucose, UA NEGATIVE NEGATIVE mg/dL   Hgb urine dipstick SMALL (A) NEGATIVE   Bilirubin Urine NEGATIVE NEGATIVE   Ketones, ur NEGATIVE NEGATIVE mg/dL   Protein, ur NEGATIVE NEGATIVE mg/dL   Nitrite NEGATIVE NEGATIVE   Leukocytes, UA TRACE (A) NEGATIVE   RBC / HPF 0-5 0 - 5 RBC/hpf   WBC, UA 0-5 0 - 5 WBC/hpf   Bacteria, UA NONE SEEN NONE SEEN   Squamous Epithelial / LPF 0-5 (A) NONE SEEN      Assessment & Plan:   Problem List Items Addressed This Visit      Nervous and Auditory   Multiple sclerosis ([place])    With weakness; refer to home health for social work consult, PT, home aide or SNF for 30 days if qualifies; to see Dr. Manuella Ghazi next week        Immune and Lymphatic   Inguinal adenopathy    Patient to have CT scan next week        Other   Multiple falls    Encouraged fall alert or keep cell phone or even whistle; refer to home health social services to see what assistance is available      Cellulitis    Left leg cared for by wound care center; may be the cause of lymph node swelling but need scan to r/o malignancy          Follow up plan: Return in about 10 days (around 06/17/2016) for go over scan results.  An after-visit summary was printed and given to the patient at [facility].  Please see the patient instructions which may contain other information and recommendations beyond what is mentioned above in the assessment and plan.  Meds ordered this encounter  Medications  . VESICARE 10 MG tablet    Sig: Take 12.5 mg by mouth daily.  . Calcium Carb-Cholecalciferol (CALCIUM 1000 + D PO)    Sig: Take by mouth.  .  triamterene-hydrochlorothiazide (MAXZIDE) 75-50 MG tablet    Sig: Take 1 tablet by mouth daily.    No orders of the defined types were placed in this encounter.

## 2016-06-07 NOTE — Assessment & Plan Note (Signed)
Patient to have CT scan next week

## 2016-06-07 NOTE — Assessment & Plan Note (Signed)
With weakness; refer to home health for social work consult, PT, home aide or SNF for 30 days if qualifies; to see Dr. Manuella Ghazi next week

## 2016-06-07 NOTE — Assessment & Plan Note (Signed)
Encouraged fall alert or keep cell phone or even whistle; refer to home health social services to see what assistance is available

## 2016-06-10 DIAGNOSIS — I10 Essential (primary) hypertension: Secondary | ICD-10-CM | POA: Diagnosis not present

## 2016-06-10 DIAGNOSIS — I70234 Atherosclerosis of native arteries of right leg with ulceration of heel and midfoot: Secondary | ICD-10-CM | POA: Diagnosis not present

## 2016-06-10 DIAGNOSIS — L97929 Non-pressure chronic ulcer of unspecified part of left lower leg with unspecified severity: Secondary | ICD-10-CM | POA: Diagnosis not present

## 2016-06-10 DIAGNOSIS — L97419 Non-pressure chronic ulcer of right heel and midfoot with unspecified severity: Secondary | ICD-10-CM | POA: Diagnosis not present

## 2016-06-10 DIAGNOSIS — I70249 Atherosclerosis of native arteries of left leg with ulceration of unspecified site: Secondary | ICD-10-CM | POA: Diagnosis not present

## 2016-06-10 DIAGNOSIS — L97219 Non-pressure chronic ulcer of right calf with unspecified severity: Secondary | ICD-10-CM | POA: Diagnosis not present

## 2016-06-10 DIAGNOSIS — J449 Chronic obstructive pulmonary disease, unspecified: Secondary | ICD-10-CM | POA: Diagnosis not present

## 2016-06-10 DIAGNOSIS — G35 Multiple sclerosis: Secondary | ICD-10-CM | POA: Diagnosis not present

## 2016-06-10 DIAGNOSIS — L03116 Cellulitis of left lower limb: Secondary | ICD-10-CM | POA: Diagnosis not present

## 2016-06-10 DIAGNOSIS — I70232 Atherosclerosis of native arteries of right leg with ulceration of calf: Secondary | ICD-10-CM | POA: Diagnosis not present

## 2016-06-10 DIAGNOSIS — R339 Retention of urine, unspecified: Secondary | ICD-10-CM | POA: Diagnosis not present

## 2016-06-10 DIAGNOSIS — Z792 Long term (current) use of antibiotics: Secondary | ICD-10-CM | POA: Diagnosis not present

## 2016-06-11 DIAGNOSIS — G35 Multiple sclerosis: Secondary | ICD-10-CM | POA: Diagnosis not present

## 2016-06-12 ENCOUNTER — Encounter: Payer: Medicare Other | Admitting: Internal Medicine

## 2016-06-12 DIAGNOSIS — I739 Peripheral vascular disease, unspecified: Secondary | ICD-10-CM | POA: Diagnosis not present

## 2016-06-12 DIAGNOSIS — L97512 Non-pressure chronic ulcer of other part of right foot with fat layer exposed: Secondary | ICD-10-CM | POA: Diagnosis not present

## 2016-06-12 DIAGNOSIS — J449 Chronic obstructive pulmonary disease, unspecified: Secondary | ICD-10-CM | POA: Diagnosis not present

## 2016-06-12 DIAGNOSIS — I10 Essential (primary) hypertension: Secondary | ICD-10-CM | POA: Diagnosis not present

## 2016-06-12 DIAGNOSIS — L97212 Non-pressure chronic ulcer of right calf with fat layer exposed: Secondary | ICD-10-CM | POA: Diagnosis not present

## 2016-06-12 DIAGNOSIS — G4733 Obstructive sleep apnea (adult) (pediatric): Secondary | ICD-10-CM | POA: Diagnosis not present

## 2016-06-12 DIAGNOSIS — Z79899 Other long term (current) drug therapy: Secondary | ICD-10-CM | POA: Diagnosis not present

## 2016-06-12 DIAGNOSIS — L043 Acute lymphadenitis of lower limb: Secondary | ICD-10-CM | POA: Diagnosis not present

## 2016-06-12 DIAGNOSIS — L97829 Non-pressure chronic ulcer of other part of left lower leg with unspecified severity: Secondary | ICD-10-CM | POA: Diagnosis not present

## 2016-06-12 DIAGNOSIS — G35 Multiple sclerosis: Secondary | ICD-10-CM | POA: Diagnosis not present

## 2016-06-12 DIAGNOSIS — L97222 Non-pressure chronic ulcer of left calf with fat layer exposed: Secondary | ICD-10-CM | POA: Diagnosis not present

## 2016-06-13 NOTE — Progress Notes (Addendum)
[name] ([id number]) Visit Report for 06/12/2016 Chief Complaint Document Details Patient Name: [name], [name] Date of Service: 06/12/2016 3:30 PM Medical Record Patient Account Number: [id number] Number: Treating RN: Cornell Barman 12-06-1950 (66 y.o. Other Clinician: Date of Birth/Sex: Female) Treating ROBSON, MICHAEL Primary Care Provider: LADA, MELINDA Provider/Extender: G Referring Provider: LADA, MELINDA Weeks in Treatment: 29 Information Obtained from: Patient Chief Complaint returns for evaluation of bilateral lower extremity ulcerations Electronic Signature(s) Signed: 06/12/2016 5:24:26 PM By: Linton Ham MD Entered By: Linton Ham on 06/12/2016 16:57:15 [name], [name] ([id number]) -------------------------------------------------------------------------------- HPI Details Patient Name: [name] Date of Service: 06/12/2016 3:30 PM Medical Record Patient Account Number: [id number] Number: Treating RN: Cornell Barman [birth year] (66 y.o. Other Clinician: Date of Birth/Sex: Female) Treating ROBSON, MICHAEL Primary Care Provider: LADA, MELINDA Provider/Extender: G Referring Provider: LADA, MELINDA Weeks in Treatment: 29 History of Present Illness Location: left and right lower extremity and dorsum of the right foot Quality: Patient reports experiencing a sharp pain to affected area(s). Severity: Patient rates the pain to be a 8 out of 10 especially with palpation Duration: Patient has had the wound for > 6 months prior to seeking treatment at the wound center Timing: Pain in wound is Intermittent in severity but persistent Context: The wound appeared gradually over time Modifying Factors: Other treatment(s) tried include: symptomatic treatment as advised by her PCP Associated Signs and Symptoms: Patient reports having increase swelling iin her bilateral lower extremities HPI Description: 66 year old with a past medical history significant  for [name], urinary incontinence, and obesity. She has been seen in the wound clinic before for lower extremity ulcerations treated with compression therapy. she is also known to have hypertension, peripheral vascular disease, COPD, obstructive sleep apnea, lumbar radiculopathy, kyphoscoliosis, urinary issues and tobacco abuse. Smokes a packet of cigarettes a day was recently seen at the [facility] for swelling of her legs and feet with a ulceration on the dorsum of the right foot which has been there for about 6 months. she was recently in the ER about a month ago where she was seen for shortness of breath and swelling of the legs and a chest x-ray was within normal limits had an increase in her BNP and was given Lasix and put on doxycycline for a mild cellulitis and possible UTI. Wounds aresmaller today 11/13/15. Debrided and will continue Santyl. 12/21/2015 -- she was admitted to the hospital overnight on 12/18/2015 and diagnosed with urinary retention and cellulitis of the left lower leg. is past to take clindamycin and use Santyl for her wounds. 01/15/2016 -- come back to see [place] for almost a month and continues to be noncompliant with her dressings 01/30/16 patient presents today for a follow-up visit concerning her ongoing bilateral lower extremity wounds. We have not seen her for the past 2 weeks although we are supposed to be seen her for weekly visits. She currently has a Foley catheter. She tells me that the wounds are intensely painful especially with pressure and palpation at this point in time. Fortunately she is having no interval signs or symptoms of systemic infection but unfortunately the wounds have not improved dramatically since we last saw her. She does have home health coming to take care of her as well. She is currently not in any compression wraps. 02/06/16 ON evaluation today patient continues to experience discomfort regarding her bilateral lower extremity  ulcers. She has continued to tolerate the dressing changes at this point in time and continues  to have a Foley catheter as well. Fortunately she is back this week in the past she has been somewhat noncompliant with follow-ups I'm glad to see her today. She tells me that her pain level varies but can be as high as a 7 out of 10 with manipulation of the wound. She tells me that she used to be on oxycodone which Ou, [name] H. ([id number]) was managed by the pain clinic although she is no longer on that as she tells me that she was actually smoking marijuana at the time and when this was found out they discontinued her pain medication. She no longer is taking anything pain medication wise and she also does not smoke cigarettes nor marijuana at this point. 02/12/16; this is a patient I don't believe I have previously seen. She has multiple sclerosis. She has 4 punched-out areas on the anterior lateral left leg 2 areas on the right these are all in the same condition. Reasonably small [dime size] wounds each was some depth. Surface of these wounds does not look particularly healthy as there is adherent slough. There is no evidence of surrounding infection or inflammation. ABIs in this clinic were 0.87 on the right and 0.81 on the left. She is listed as having PAD and is a smoker. Not a diabetic 02/20/16 patient I gave antibiotics to last week for erythema around both wound sites on the left lateral leg and right lateral leg. This appears to be a lot better. One of the areas on the left leg is healed however she still has 3 punched-out open areas on the left lateral calf and one on the right lateral calf and one on the dorsal foot. She is an ex-smoker quitting 1 month ago. ABIs in this clinic were 0.87 on the right 0.81 on the left 03/12/16; this is a patient I really don't have a good sense of. It would appear for the first 5 or 6 weeks of her stay in this clinic she was cared for by Dr. Con Memos. She  appeared on my schedule in mid October and I saw her twice. She has not been here however in 3 weeks. She has advanced Wound Care at home where she lives with her husband. She has multiple sclerosis. I saw her the first time she had 4 punched-out small wounds on the anterior lateral left leg it appears that she is now down to 2. She also had wounds on the lateral and medial aspect of her right leg which were also small and punched out. The only one that remains is on the right lateral. Because of the nature of her wound I went ahead and ordered formal arterial studies this showed an ABI of 1 on the right and one on the left. TBI of 1.4 on the right and 0.79 on the left. She had normal waveforms. She was in the emergency room on 02/28/16; there is a noted edema of her left leg after a fall. She had a duplex ultrasound that showed no evidence for an acute DVT from the left groin to the popliteal fossa. The study was limited in the calf veins due to edema. Also noticeable that she had a left inguinal enlarged lymph node up to 6.2 cm. She also had a left knee x-ray that showed no acute findings and a right foot x-ray that showed soft tissue swelling but no radiographic evidence of osteomyelitis. The patient does stop smoking 03-19-16 [name] presents today for evaluation of her bilateral lower  extremity ulcerations. She states that she has not smoked in several weeks. She denies any pain or discomfort to bilateral lower extremities, tolerating compression therapy as ordered. 03/26/16; I have not seen this patient in 2 weeks however she has done very well with improvements in the areas in question. After we obtain normal arterial studies, increasing the 4-layer compression really seems to look done a nice job here. She has one open area on the left lateral leg and one on the medial right leg. These have filled in nicely and are now superficial wounds that showed epithelialized. I note the left  inguinal lymph node at 6.2 cm. I may need to refer this back to the patient's primary doctor. 04/03/16; patient has 2 remaining wounds 1 on the left lateral leg and one on the right medial leg. Both of filled in nicely since we are able to increase her from a 3 layer to a 4 layer compression. I am also following up on the lymph node notable on her left leg DVT rule out in November 04/10/16; area on the right lateral leg is healed. The area on the left leg is still open but looks considerably better with healthy granulation and less wound area. I12/20/17; last week we healed the patient doubt with regards to the wound on the right leg to her on compression stocking 20-30 mmHg as it turns out I don't think she had a stocking, predictably therefore she has reopened. The left leg as well as the wound on the lateral aspect. Been generally small line 04/24/16; we healed out her right leg 2 weeks ago to her own 20-30 mm compression stockings although as it turns out she didn't have these and did not purchase some apparently because of financial issues. The left leg wound is on the lateral aspect. Both of these wounds are just about healed. 05/01/16; her wounds on the left leg are healed out today. The area on the right leg is also healed. Vitamin diligent effort of our staff we have not been able to get any form of compression stocking to this patient. The [name], [name] ([id number]) orders for juxtalite's were given to home health this never materialized. We ordered compression stockings I don't think she was able to afford these. We had a discussion today with both the patient and her husband without these, will accumulate edema and the wounds will simply reopen again. 05/14/16 READMISSION this is a patient we healed out 2 weeks ago. As bilateral lower extremity venous wounds probably some degree of lymphedema. At the beginning in November I did a duplex ultrasound of her left leg that was negative for  DVT but did show a lymph node in the left inguinal area presumably reactive. We've been using compression to her lower extremities eventually healed all her wounds on her bilateral calves but we were not able to get any form of compression stockings for her in spite of intense efforts of our staff. She returns today with reopening on the left leg did not the right she has several superficial areas anteriorly but an actual frank ulcer on the lateral left calf. This is about the size of a dime or smaller. She does not really complain of pain. The patient has a history of PAD however arterial studies we did in November were normal. Her ABIs and Doppler waveforms were both within the normal limits. The patient has a history of [name]. In discussing things with her today it would appear that the opening  was noted 2 weeks ago according to the patient. We gave her Tubigrip stockings when she left the clinic 05/21/16; she has not yet had the duplex ultrasound of her thigh, this is booked for Thursday. The wound on the left lateral lower leg is improved 05/28/16; her duplex ultrasound of the left leg specifically her left thigh did not show a DVT however did show hypoechogenic enlarged left inguinal lymph nodes up to 6.9 x 3.7 x 6.8. This also showed in the one in November however maximal diameter was 6.2, at that point I thought these were reactive secondary to cellulitis however there is obviously a differential here. Her wound on the left lower leg is closed. She has a superficial open area on the base of her left fifth metatarsal head on the plantar foot. It looks as though she has some wrap injuries on the anterior leg 06/05/16; since she was last year she had a fall on 1/31. She was seen in the ER but sent back home. I think she is also been seen by her primary physician who picked up on the swelling and the lymphadenopathy. She is ordered a CT scan of the abdomen and pelvis however this will be without  contrast because of stage IIIB renal insufficiency. Nevertheless this should be a start the workup to make sure there is not is more systemic problem here. She will probably need consideration of a biopsy of the lymph node in her inguinal area, this would need general surgery. As far as her wounds are concern today the area on the left plantar foot is healed. She has a small weeping area on the left lower leg. The wrap injury from last week has largely Pardoxically there is a lot less edema in the left thigh. 06/12/16; CT scan is on Friday. She has 2 small open areas one on the lateral left lower leg and one on the anterior lower leg on the left. Both of these looks healthy. 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 Other Clinician: Date of Birth/Sex: Female) Treating ROBSON, MICHAEL Primary Care Provider: LADA, MELINDA Provider/Extender: G Referring Provider: LADA, MELINDA Weeks in Treatment: 29 Constitutional Sitting or standing Blood Pressure is within target range for patient.. Pulse regular and within target range for patient.Marland Kitchen Respirations regular, non-labored and within target range.. Temperature is normal and within the target range for the patient.. Patient's appearance is neat and clean. Appears in no acute distress. Well nourished and well developed.. Cardiovascular Pedal pulses palpable and strong bilaterally.. Notes Wound exam; the patient has only superficial wounds remaining 1 anteriorly  with some length but very superficial and clean looking on the left anterior leg. The second is on the anterior lateral lower leg. There is no evidence of infection Electronic Signature(s) Signed: 06/12/2016 5:24:26 PM By: Linton Ham MD Entered By: Linton Ham on 06/12/2016 17:00:19 [name] ([id number]) -------------------------------------------------------------------------------- Physician Orders Details Patient Name: [name] Date of Service: 06/12/2016 3:30 PM Medical Record Patient Account Number: [id number] Number: Treating RN: Cornell Barman 03/07/1951 (65 y.o. Other Clinician: Date of Birth/Sex: Female) Treating ROBSON, MICHAEL Primary Care Provider: LADA, MELINDA Provider/Extender:  G Referring Provider: LADA, MELINDA Weeks in Treatment: 7 Verbal / Phone Orders: No Diagnosis Coding ICD-10 Coding Code Description [phone] Non-pressure chronic ulcer of right calf with fat layer exposed L97.512 Non-pressure chronic ulcer of other part of right foot with fat layer exposed G35 Multiple sclerosis E66.01 Morbid (severe) obesity due to excess calories F17.218 Nicotine dependence, cigarettes, with other nicotine-induced disorders L04.3 Acute lymphadenitis of lower limb Wound Cleansing Wound #11 Left,Anterior Lower Leg o Clean wound with Normal Saline. Wound #9 Left,Lateral Lower Leg o Clean wound with Normal Saline. Anesthetic Wound #11 Left,Anterior Lower Leg o Topical Lidocaine 4% cream applied to wound bed prior to debridement Wound #9 Left,Lateral Lower Leg o Topical Lidocaine 4% cream applied to wound bed prior to debridement Primary Wound Dressing Wound #11 Left,Anterior Lower Leg o Aquacel Ag Wound #9 Left,Lateral Lower Leg o Aquacel Ag Secondary Dressing Wound #11 Left,Anterior Lower Leg o ABD pad Robins, Chanetta H. ([id number]) Wound #9 Left,Lateral Lower Leg o ABD pad Dressing Change Frequency Wound #11 Left,Anterior  Lower Leg o Three times weekly Wound #9 Left,Lateral Lower Leg o Three times weekly Follow-up Appointments Wound #11 Left,Anterior Lower Leg o Return Appointment in 1 week. Wound #9 Left,Lateral Lower Leg o Return Appointment in 1 week. Edema Control Wound #11 Left,Anterior Lower Leg o Kerlix and Coban - Left Lower Extremity - do not wrap too tight o Elevate legs to the level of the heart and pump ankles as often as possible Wound #9 Left,Lateral Lower Leg o Kerlix and Coban - Left Lower Extremity - do not wrap too tight o Elevate legs to the level of the heart and pump ankles as often as possible Home Health Wound #11 [place] Nurse may visit PRN to address patientos wound care needs. o FACE TO FACE ENCOUNTER: MEDICARE and MEDICAID PATIENTS: I certify that this patient is under my care and that I had a face-to-face encounter that meets the physician face-to-face encounter requirements with this patient on this date. The encounter with the patient was in whole or in part for the following MEDICAL CONDITION: (primary reason for [place]) MEDICAL NECESSITY: I certify, that based on my findings, NURSING services are a medically necessary home health service. HOME BOUND STATUS: I certify that my clinical findings support that this patient is homebound (i.e., Due to illness or injury, pt requires aid of supportive devices such as crutches, cane, wheelchairs, walkers, the use of special transportation or the assistance of another person to leave their place of residence. There is a normal inability to leave the home and doing so requires considerable and taxing effort. Other absences are for medical reasons / religious services and are infrequent or of short duration when for other reasons). o If current dressing causes regression in wound condition, may D/C ordered dressing product/s and apply Normal  Saline Moist Dressing daily until next [place] / Other MD appointment. [place] of regression in wound condition at [phone]. o Please direct any NON-WOUND related issues/requests for orders to patient's Primary Care Physician [name], [name] ([id number]) Wound #9 Left,Lateral Lower Leg o [place] Nurse may visit PRN to address patientos wound care needs. o FACE TO FACE ENCOUNTER: MEDICARE and MEDICAID PATIENTS: I certify that this patient is under my care and that I had a face-to-face encounter that meets the physician face-to-face encounter requirements with this patient on this date. The encounter  with the patient was in whole or in part for the following MEDICAL CONDITION: (primary reason for [place]) MEDICAL NECESSITY: I certify, that based on my findings, NURSING services are a medically necessary home health service. HOME BOUND STATUS: I certify that my clinical findings support that this patient is homebound (i.e., Due to illness or injury, pt requires aid of supportive devices such as crutches, cane, wheelchairs, walkers, the use of special transportation or the assistance of another person to leave their place of residence. There is a normal inability to leave the home and doing so requires considerable and taxing effort. Other absences are for medical reasons / religious services and are infrequent or of short duration when for other reasons). o If current dressing causes regression in wound condition, may D/C ordered dressing product/s and apply Normal Saline Moist Dressing daily until next [place] / Other MD appointment. [place] of regression in wound condition at [phone]. o Please direct any NON-WOUND related issues/requests for orders to patient's Primary Care Physician Electronic Signature(s) Signed: 06/12/2016 5:24:26 PM By: Linton Ham  MD Signed: 06/12/2016 7:45:01 PM By: Gretta Cool, RN, BSN, Kim RN, BSN Entered By: Gretta Cool, RN, BSN, Kim on 06/12/2016 17:01:38 [name], [name] ([id number]) -------------------------------------------------------------------------------- Problem List Details Patient Name: [name] Date of Service: 06/12/2016 3:30 PM Medical Record Patient Account Number: [id number] Number: Treating RN: Cornell Barman [birth year] (65 y.o. Other Clinician: Date of Birth/Sex: Female) Treating ROBSON, MICHAEL Primary Care Provider: LADA, MELINDA Provider/Extender: G Referring Provider: LADA, MELINDA Weeks in Treatment: 29 Active Problems ICD-10 Encounter Code Description Active Date Diagnosis L97.212 Non-pressure chronic ulcer of right calf with fat layer 11/20/2015 Yes exposed L97.512 Non-pressure chronic ulcer of other part of right foot with 11/20/2015 Yes fat layer exposed G35 Multiple sclerosis 11/20/2015 Yes E66.01 Morbid (severe) obesity due to excess calories 11/20/2015 Yes F17.218 Nicotine dependence, cigarettes, with other nicotine- 11/20/2015 Yes induced disorders L04.3 Acute lymphadenitis of lower limb 04/03/2016 Yes Inactive Problems ICD-10 Code Description Active Date Inactive Date L97.222 Non-pressure chronic ulcer of left calf with fat layer 11/20/2015 11/20/2015 exposed Resolved Problems [name], [name] ([id number]) Electronic Signature(s) Signed: 06/12/2016 5:24:26 PM By: Linton Ham MD Entered By: Linton Ham on 06/12/2016 16:56:57 [place], [name] ([id number]) -------------------------------------------------------------------------------- Progress Note Details Patient Name: [name] Date of Service: 06/12/2016 3:30 PM Medical Record Patient Account Number: [id number] Number: Treating RN: Cornell Barman [birth year] (65 y.o. Other Clinician: Date of Birth/Sex: Female) Treating ROBSON, MICHAEL Primary Care Provider: LADA, MELINDA Provider/Extender: G Referring  Provider: LADA, MELINDA Weeks in Treatment: 29 Subjective Chief Complaint Information obtained from Patient returns for evaluation of bilateral lower extremity ulcerations History of Present Illness (HPI) The following HPI elements were documented for the patient's wound: Location: left and right lower extremity and dorsum of the right foot Quality: Patient reports experiencing a sharp pain to affected area(s). Severity: Patient rates the pain to be a 8 out of 10 especially with palpation Duration: Patient has had the wound for > 6 months prior to seeking treatment at the wound center Timing: Pain in wound is Intermittent in severity but persistent Context: The wound appeared gradually over time Modifying Factors: Other treatment(s) tried include: symptomatic treatment as advised by her PCP Associated Signs and Symptoms: Patient reports having increase swelling iin her bilateral lower extremities 66 year old with a past medical history significant for [name], urinary incontinence, and obesity. She has been seen in the wound clinic before for lower extremity ulcerations treated  with compression therapy. she is also known to have hypertension, peripheral vascular disease, COPD, obstructive sleep apnea, lumbar radiculopathy, kyphoscoliosis, urinary issues and tobacco abuse. Smokes a packet of cigarettes a day was recently seen at the [facility] for swelling of her legs and feet with a ulceration on the dorsum of the right foot which has been there for about 6 months. she was recently in the ER about a month ago where she was seen for shortness of breath and swelling of the legs and a chest x-ray was within normal limits had an increase in her BNP and was given Lasix and put on doxycycline for a mild cellulitis and possible UTI. Wounds aresmaller today 11/13/15. Debrided and will continue Santyl. 12/21/2015 -- she was admitted to the hospital overnight on 12/18/2015 and diagnosed with  urinary retention and cellulitis of the left lower leg. is past to take clindamycin and use Santyl for her wounds. 01/15/2016 -- come back to see [place] for almost a month and continues to be noncompliant with her dressings 01/30/16 patient presents today for a follow-up visit concerning her ongoing bilateral lower extremity wounds. We have not seen her for the past 2 weeks although we are supposed to be seen her for weekly visits. She currently has a Foley catheter. She tells me that the wounds are intensely painful especially with pressure and palpation at this point in time. Fortunately she is having no interval signs or symptoms of systemic infection but unfortunately the wounds have not improved dramatically since we last saw her. She does have home health coming to take care of her as well. She is currently not in any compression wraps. [name], [name] ([id number]) 02/06/16 ON evaluation today patient continues to experience discomfort regarding her bilateral lower extremity ulcers. She has continued to tolerate the dressing changes at this point in time and continues to have a Foley catheter as well. Fortunately she is back this week in the past she has been somewhat noncompliant with follow-ups I'm glad to see her today. She tells me that her pain level varies but can be as high as a 7 out of 10 with manipulation of the wound. She tells me that she used to be on oxycodone which was managed by the pain clinic although she is no longer on that as she tells me that she was actually smoking marijuana at the time and when this was found out they discontinued her pain medication. She no longer is taking anything pain medication wise and she also does not smoke cigarettes nor marijuana at this point. 02/12/16; this is a patient I don't believe I have previously seen. She has multiple sclerosis. She has 4 punched-out areas on the anterior lateral left leg 2 areas on the right these are all in the same  condition. Reasonably small [dime size] wounds each was some depth. Surface of these wounds does not look particularly healthy as there is adherent slough. There is no evidence of surrounding infection or inflammation. ABIs in this clinic were 0.87 on the right and 0.81 on the left. She is listed as having PAD and is a smoker. Not a diabetic 02/20/16 patient I gave antibiotics to last week for erythema around both wound sites on the left lateral leg and right lateral leg. This appears to be a lot better. One of the areas on the left leg is healed however she still has 3 punched-out open areas on the left lateral calf and one on the right  lateral calf and one on the dorsal foot. She is an ex-smoker quitting 1 month ago. ABIs in this clinic were 0.87 on the right 0.81 on the left 03/12/16; this is a patient I really don't have a good sense of. It would appear for the first 5 or 6 weeks of her stay in this clinic she was cared for by Dr. Con Memos. She appeared on my schedule in mid October and I saw her twice. She has not been here however in 3 weeks. She has advanced Wound Care at home where she lives with her husband. She has multiple sclerosis. I saw her the first time she had 4 punched-out small wounds on the anterior lateral left leg it appears that she is now down to 2. She also had wounds on the lateral and medial aspect of her right leg which were also small and punched out. The only one that remains is on the right lateral. Because of the nature of her wound I went ahead and ordered formal arterial studies this showed an ABI of 1 on the right and one on the left. TBI of 1.4 on the right and 0.79 on the left. She had normal waveforms. She was in the emergency room on 02/28/16; there is a noted edema of her left leg after a fall. She had a duplex ultrasound that showed no evidence for an acute DVT from the left groin to the popliteal fossa. The study was limited in the calf veins due to edema.  Also noticeable that she had a left inguinal enlarged lymph node up to 6.2 cm. She also had a left knee x-ray that showed no acute findings and a right foot x-ray that showed soft tissue swelling but no radiographic evidence of osteomyelitis. The patient does stop smoking 03-19-16 [name] presents today for evaluation of her bilateral lower extremity ulcerations. She states that she has not smoked in several weeks. She denies any pain or discomfort to bilateral lower extremities, tolerating compression therapy as ordered. 03/26/16; I have not seen this patient in 2 weeks however she has done very well with improvements in the areas in question. After we obtain normal arterial studies, increasing the 4-layer compression really seems to look done a nice job here. She has one open area on the left lateral leg and one on the medial right leg. These have filled in nicely and are now superficial wounds that showed epithelialized. I note the left inguinal lymph node at 6.2 cm. I may need to refer this back to the patient's primary doctor. 04/03/16; patient has 2 remaining wounds 1 on the left lateral leg and one on the right medial leg. Both of filled in nicely since we are able to increase her from a 3 layer to a 4 layer compression. I am also following up on the lymph node notable on her left leg DVT rule out in November 04/10/16; area on the right lateral leg is healed. The area on the left leg is still open but looks considerably better with healthy granulation and less wound area. I12/20/17; last week we healed the patient doubt with regards to the wound on the right leg to her on compression stocking 20-30 mmHg as it turns out I don't think she had a stocking, predictably therefore she [name], [name] H. ([id number]) has reopened. The left leg as well as the wound on the lateral aspect. Been generally small line 04/24/16; we healed out her right leg 2 weeks ago to her own  20-30 mm compression  stockings although as it turns out she didn't have these and did not purchase some apparently because of financial issues. The left leg wound is on the lateral aspect. Both of these wounds are just about healed. 05/01/16; her wounds on the left leg are healed out today. The area on the right leg is also healed. Vitamin diligent effort of our staff we have not been able to get any form of compression stocking to this patient. The orders for juxtalite's were given to home health this never materialized. We ordered compression stockings I don't think she was able to afford these. We had a discussion today with both the patient and her husband without these, will accumulate edema and the wounds will simply reopen again. 05/14/16 READMISSION this is a patient we healed out 2 weeks ago. As bilateral lower extremity venous wounds probably some degree of lymphedema. At the beginning in November I did a duplex ultrasound of her left leg that was negative for DVT but did show a lymph node in the left inguinal area presumably reactive. We've been using compression to her lower extremities eventually healed all her wounds on her bilateral calves but we were not able to get any form of compression stockings for her in spite of intense efforts of our staff. She returns today with reopening on the left leg did not the right she has several superficial areas anteriorly but an actual frank ulcer on the lateral left calf. This is about the size of a dime or smaller. She does not really complain of pain. The patient has a history of PAD however arterial studies we did in November were normal. Her ABIs and Doppler waveforms were both within the normal limits. The patient has a history of [name]. In discussing things with her today it would appear that the opening was noted 2 weeks ago according to the patient. We gave her Tubigrip stockings when she left the clinic 05/21/16; she has not yet had the duplex ultrasound of her  thigh, this is booked for Thursday. The wound on the left lateral lower leg is improved 05/28/16; her duplex ultrasound of the left leg specifically her left thigh did not show a DVT however did show hypoechogenic enlarged left inguinal lymph nodes up to 6.9 x 3.7 x 6.8. This also showed in the one in November however maximal diameter was 6.2, at that point I thought these were reactive secondary to cellulitis however there is obviously a differential here. Her wound on the left lower leg is closed. She has a superficial open area on the base of her left fifth metatarsal head on the plantar foot. It looks as though she has some wrap injuries on the anterior leg 06/05/16; since she was last year she had a fall on 1/31. She was seen in the ER but sent back home. I think she is also been seen by her primary physician who picked up on the swelling and the lymphadenopathy. She is ordered a CT scan of the abdomen and pelvis however this will be without contrast because of stage IIIB renal insufficiency. Nevertheless this should be a start the workup to make sure there is not is more systemic problem here. She will probably need consideration of a biopsy of the lymph node in her inguinal area, this would need general surgery. As far as her wounds are concern today the area on the left plantar foot is healed. She has a small weeping area on the left  lower leg. The wrap injury from last week has largely Pardoxically there is a lot less edema in the left thigh. 06/12/16; CT scan is on Friday. She has 2 small open areas one on the lateral left lower leg and one on the anterior lower leg on the left. Both of these looks healthy. I reduced her compression last week to Kerlix and Coban this seems to have not a reasonable job Objective [name], [name] H. ([id number]) Constitutional Sitting or standing Blood Pressure is within target range for patient.. Pulse regular and within target range for patient.Marland Kitchen Respirations  regular, non-labored and within target range.. Temperature is normal and within the target range for the patient.. Patient's appearance is neat and clean. Appears in no acute distress. Well nourished and well developed.. Vitals Time Taken: 4:16 PM, Height: 63 in, Weight: 270 lbs, BMI: 47.8, Temperature: 98.0 F, Pulse: 70 bpm, Respiratory Rate: 18 breaths/min, Blood Pressure: 142/72 mmHg. Cardiovascular Pedal pulses palpable and strong bilaterally.. General Notes: Wound exam; the patient has only superficial wounds remaining 1 anteriorly with some length but very superficial and clean looking on the left anterior leg. The second is on the anterior lateral lower leg. There is no evidence of infection Integumentary (Hair, Skin) Wound #11 status is Open. Original cause of wound was Pressure Injury. The wound is located on the Left,Anterior Lower Leg. The wound measures 1.2cm length x 1cm width x 0.1cm depth; 0.942cm^2 area and 0.094cm^3 volume. Wound #9 status is Open. Original cause of wound was Shear/Friction. The wound is located on the Left,Lateral Lower Leg. The wound measures 0.4cm length x 0.3cm width x 0.1cm depth; 0.094cm^2 area and 0.009cm^3 volume. Assessment Active Problems ICD-10 L97.212 - Non-pressure chronic ulcer of right calf with fat layer exposed L97.512 - Non-pressure chronic ulcer of other part of right foot with fat layer exposed G35 - Multiple sclerosis E66.01 - Morbid (severe) obesity due to excess calories F17.218 - Nicotine dependence, cigarettes, with other nicotine-induced disorders L04.3 - Acute lymphadenitis of lower limb Plan [name], [name] H. ([id number]) Wound Cleansing: Wound #11 Left,Anterior Lower Leg: Clean wound with Normal Saline. Wound #9 Left,Lateral Lower Leg: Clean wound with Normal Saline. Anesthetic: Wound #11 Left,Anterior Lower Leg: Topical Lidocaine 4% cream applied to wound bed prior to debridement Wound #9 Left,Lateral Lower  Leg: Topical Lidocaine 4% cream applied to wound bed prior to debridement Primary Wound Dressing: Wound #11 Left,Anterior Lower Leg: Aquacel Ag Wound #9 Left,Lateral Lower Leg: Aquacel Ag Secondary Dressing: Wound #11 Left,Anterior Lower Leg: ABD pad Wound #9 Left,Lateral Lower Leg: ABD pad Dressing Change Frequency: Wound #11 Left,Anterior Lower Leg: Three times weekly Wound #9 Left,Lateral Lower Leg: Three times weekly Follow-up Appointments: Wound #11 Left,Anterior Lower Leg: Return Appointment in 1 week. Wound #9 Left,Lateral Lower Leg: Return Appointment in 1 week. Edema Control: Wound #11 Left,Anterior Lower Leg: Kerlix and Coban - Left Lower Extremity - do not wrap too tight Elevate legs to the level of the heart and pump ankles as often as possible Wound #9 Left,Lateral Lower Leg: Kerlix and Coban - Left Lower Extremity - do not wrap too tight Elevate legs to the level of the heart and pump ankles as often as possible Home Health: Wound #11 Left,Anterior Lower Leg: [place] Nurse may visit PRN to address patient s wound care needs. FACE TO FACE ENCOUNTER: MEDICARE and MEDICAID PATIENTS: I certify that this patient is under my care and that I had a face-to-face encounter that meets the physician  face-to-face encounter requirements with this patient on this date. The encounter with the patient was in whole or in part for the following MEDICAL CONDITION: (primary reason for [facility]) MEDICAL NECESSITY: I certify, that based on my findings, NURSING services are a medically necessary home health service. HOME BOUND STATUS: I certify that my clinical findings support that this patient is homebound (i.e., Due to illness or injury, pt requires aid of supportive devices such as crutches, cane, wheelchairs, walkers, the use of special transportation or the assistance of another person to leave their place of residence. There is a normal  inability to leave the home and doing so requires considerable and taxing effort. Other absences are [name], SORDEN. ([id number]) for medical reasons / religious services and are infrequent or of short duration when for other reasons). If current dressing causes regression in wound condition, may D/C ordered dressing product/s and apply Normal Saline Moist Dressing daily until next [place] / Other MD appointment. [place] of regression in wound condition at [phone]. Please direct any NON-WOUND related issues/requests for orders to patient's Primary Care Physician Wound #9 Left,Lateral Lower Leg: [place] Nurse may visit PRN to address patient s wound care needs. FACE TO FACE ENCOUNTER: MEDICARE and MEDICAID PATIENTS: I certify that this patient is under my care and that I had a face-to-face encounter that meets the physician face-to-face encounter requirements with this patient on this date. The encounter with the patient was in whole or in part for the following MEDICAL CONDITION: (primary reason for [place]) MEDICAL NECESSITY: I certify, that based on my findings, NURSING services are a medically necessary home health service. HOME BOUND STATUS: I certify that my clinical findings support that this patient is homebound (i.e., Due to illness or injury, pt requires aid of supportive devices such as crutches, cane, wheelchairs, walkers, the use of special transportation or the assistance of another person to leave their place of residence. There is a normal inability to leave the home and doing so requires considerable and taxing effort. Other absences are for medical reasons / religious services and are infrequent or of short duration when for other reasons). If current dressing causes regression in wound condition, may D/C ordered dressing product/s and apply Normal Saline Moist Dressing daily until next [place] / Other MD appointment. [place] of regression in wound condition at [phone]. Please direct any NON-WOUND related issues/requests for orders to patient's Primary Care Physician continue with silver collagen with 2 layer wrap CT scan on friday (unenhanced) Electronic Signature(s) Signed: 06/17/2016 12:35:43 PM By: Gretta Cool RN, BSN, Kim RN, BSN Signed: 06/18/2016 7:53:49 AM By: Linton Ham MD Previous Signature: 06/12/2016 5:24:26 PM Version By: Linton Ham MD Entered By: Gretta Cool, RN, BSN, Kim on 06/17/2016 12:35:43 DEBBRA, REICH ([id number]) -------------------------------------------------------------------------------- [facility] Details Patient Name: [name] Date of Service: 06/12/2016 Medical Record Patient Account Number: [id number] Number: Treating RN: Cornell Barman 12-03-1950 (65 y.o. Other Clinician: Date of Birth/Sex: Female) Treating ROBSON, MICHAEL Primary Care Provider: LADA, MELINDA Provider/Extender: G Referring Provider: LADA, [place] Service Line: Outpatient Weeks in Treatment: 29 Diagnosis Coding ICD-10 Codes Code Description [phone] Non-pressure chronic ulcer of right calf with fat layer exposed L97.512 Non-pressure chronic ulcer of other part of right foot with fat layer exposed G35 Multiple sclerosis E66.01 Morbid (severe) obesity due to excess calories F17.218 Nicotine dependence, cigarettes, with other nicotine-induced disorders L04.3 Acute lymphadenitis of lower limb L97.221  Non-pressure chronic ulcer of left calf limited to breakdown of skin Facility Procedures CPT4 Code: [id number] Description: [id number] - WOUND CARE VISIT-LEV 3 EST PT Modifier: Quantity: 1 Physician Procedures CPT4 Code Description: [id number] - WC PHYS LEVEL 2 - EST PT ICD-10 Description Diagnosis L97.221 Non-pressure chronic ulcer of left calf limited to Modifier: breakdown of skin Quantity: 1 Electronic Signature(s) Signed: 06/12/2016  7:45:01 PM By: Gretta Cool RN, BSN, Kim RN, BSN Signed: 06/18/2016 7:53:49 AM By: Linton Ham MD Previous Signature: 06/12/2016 5:24:26 PM Version By: Linton Ham MD Entered By: Gretta Cool, RN, BSN, Kim on 06/12/2016 17:48:47

## 2016-06-13 NOTE — Progress Notes (Signed)
SEDINA, HAUTH ([id number]) Visit Report for 06/12/2016 Arrival Information Details Patient Name: [name], [name] Date of Service: 06/12/2016 3:30 PM Medical Record Number: [id number] Patient Account Number: [id number] Date of Birth/Sex: [birth year] (65 y.o. Female) Treating RN: Cornell Barman Primary Care Afsa Meany: LADA, [facility] Other Clinician: Referring Jilian West: LADA, [place] Treating Alonia Dibuono/Extender: Tito Dine in Treatment: 29 Visit Information History Since Last Visit Added or deleted any medications: No Patient Arrived: [place] Any new allergies or adverse reactions: No Arrival Time: 16:15 Had a fall or experienced change in No Accompanied By: husband activities of daily living that may affect Transfer Assistance: Manual risk of falls: Patient Identification Verified: Yes Signs or symptoms of abuse/neglect since last No Secondary Verification Process Yes visito Completed: Has Dressing in Place as Prescribed: Yes Patient Requires Transmission-Based No Has Compression in Place as Prescribed: Yes Precautions: Pain Present Now: No Patient Has Alerts: Yes Electronic Signature(s) Signed: 06/12/2016 7:45:01 PM By: Gretta Cool, RN, BSN, Kim RN, BSN Entered By: Gretta Cool, RN, BSN, Kim on 06/12/2016 16:15:45 Pember, [name] ([id number]) -------------------------------------------------------------------------------- Clinic Level of Care Assessment Details Patient Name: [name] Date of Service: 06/12/2016 3:30 PM Medical Record Number: [id number] Patient Account Number: [id number] Date of Birth/Sex: 12/05/1950 (65 y.o. Female) Treating RN: Cornell Barman Primary Care Emerald Shor: LADA, [place] Other Clinician: Referring Rain Friedt: LADA, [place] Treating Kayana Thoen/Extender: Tito Dine in Treatment: 29 Clinic Level of Care Assessment Items TOOL 4 Quantity Score []  - Use when only an EandM is performed on FOLLOW-UP visit 0 ASSESSMENTS - Nursing Assessment /  Reassessment []  - Reassessment of Co-morbidities (includes updates in patient status) 0 X - Reassessment of Adherence to Treatment Plan 1 5 ASSESSMENTS - Wound and Skin Assessment / Reassessment []  - Simple Wound Assessment / Reassessment - one wound 0 X - Complex Wound Assessment / Reassessment - multiple wounds 2 5 []  - Dermatologic / Skin Assessment (not related to wound area) 0 ASSESSMENTS - Focused Assessment []  - Circumferential Edema Measurements - multi extremities 0 []  - Nutritional Assessment / Counseling / Intervention 0 []  - Lower Extremity Assessment (monofilament, tuning fork, pulses) 0 []  - Peripheral Arterial Disease Assessment (using hand held doppler) 0 ASSESSMENTS - Ostomy and/or Continence Assessment and Care []  - Incontinence Assessment and Management 0 []  - Ostomy Care Assessment and Management (repouching, etc.) 0 PROCESS - Coordination of Care X - Simple Patient / Family Education for ongoing care 1 15 []  - Complex (extensive) Patient / Family Education for ongoing care 0 X - Staff obtains Programmer, systems, Records, Test Results / Process Orders 1 10 []  - Staff telephones HHA, Nursing Homes / Clarify orders / etc 0 []  - Routine Transfer to another Facility (non-emergent condition) 0 Jipson, Gwynneth H. ([id number]) []  - Routine Hospital Admission (non-emergent condition) 0 []  - New Admissions / Biomedical engineer / Ordering NPWT, Apligraf, etc. 0 []  - Emergency Hospital Admission (emergent condition) 0 X - Simple Discharge Coordination 1 10 []  - Complex (extensive) Discharge Coordination 0 PROCESS - Special Needs []  - Pediatric / Minor Patient Management 0 []  - Isolation Patient Management 0 []  - Hearing / Language / Visual special needs 0 []  - Assessment of Community assistance (transportation, D/C planning, etc.) 0 []  - Additional assistance / Altered mentation 0 []  - Support Surface(s) Assessment (bed, cushion, seat, etc.) 0 INTERVENTIONS - Wound Cleansing /  Measurement []  - Simple Wound Cleansing - one wound 0 X - Complex Wound Cleansing - multiple wounds 2 5 X - Wound Imaging (photographs -  any number of wounds) 1 5 []  - Wound Tracing (instead of photographs) 0 []  - Simple Wound Measurement - one wound 0 X - Complex Wound Measurement - multiple wounds 2 5 INTERVENTIONS - Wound Dressings []  - Small Wound Dressing one or multiple wounds 0 []  - Medium Wound Dressing one or multiple wounds 0 X - Large Wound Dressing one or multiple wounds 1 20 []  - Application of Medications - topical 0 []  - Application of Medications - injection 0 INTERVENTIONS - Miscellaneous []  - External ear exam 0 Jupiter, Mishika H. ([id number]) []  - Specimen Collection (cultures, biopsies, blood, body fluids, etc.) 0 []  - Specimen(s) / Culture(s) sent or taken to Lab for analysis 0 []  - Patient Transfer (multiple staff / Harrel Lemon Lift / Similar devices) 0 []  - Simple Staple / Suture removal (25 or less) 0 []  - Complex Staple / Suture removal (26 or more) 0 []  - Hypo / Hyperglycemic Management (close monitor of Blood Glucose) 0 []  - Ankle / Brachial Index (ABI) - do not check if billed separately 0 X - Vital Signs 1 5 Has the patient been seen at the hospital within the last three years: Yes Total Score: 100 Level Of Care: New/Established - Level 3 Electronic Signature(s) Signed: 06/12/2016 7:45:01 PM By: Gretta Cool, RN, BSN, Kim RN, BSN Entered By: Gretta Cool, RN, BSN, Kim on 06/12/2016 17:48:37 Milledge, [name] ([id number]) -------------------------------------------------------------------------------- Encounter Discharge Information Details Patient Name: [name] Date of Service: 06/12/2016 3:30 PM Medical Record Number: [id number] Patient Account Number: [id number] Date of Birth/Sex: Aug 08, 1950 (65 y.o. Female) Treating RN: Cornell Barman Primary Care Valita Righter: LADA, [facility] Other Clinician: Referring Annis Lagoy: LADA, [place] Treating Nowell Sites/Extender: Tito Dine in Treatment: 29 Encounter Discharge Information Items Schedule Follow-up Appointment: No Medication Reconciliation completed No and provided to Patient/Care Ahmeer Tuman: Provided on Clinical Summary of Care: 06/12/2016 Form Type Recipient Paper Patient BW Electronic Signature(s) Signed: 06/12/2016 5:01:57 PM By: Ruthine Dose Entered By: Ruthine Dose on 06/12/2016 17:01:57 [name] -------------------------------------------------------------------------------- Lower Extremity Assessment Details Patient Name: [name] Date of Service: 06/12/2016 3:30 PM Medical Record Number: [id number] Patient Account Number: [id number] Date of Birth/Sex: April 27, 1951 (66 y.o. Female) Treating RN: Cornell Barman Primary Care Amarii Bordas: LADA, [place] Other Clinician: Referring Zariel Capano: LADA, [place] Treating Alayasia Breeding/Extender: Tito Dine in Treatment: 29 Edema Assessment Assessed: [Left: No] [Right: No] E[Left: dema] [Right: :] Calf Left: Right: Point of Measurement: 33 cm From Medial Instep 39.2 cm cm Ankle Left: Right: Point of Measurement: 11 cm From Medial Instep 21.3 cm cm Vascular Assessment Claudication: Claudication Assessment [Left:None] Pulses: Dorsalis Pedis Palpable: [Left:Yes] Posterior Tibial Extremity colors, hair growth, and conditions: Extremity Color: [Left:Red] Hair Growth on Extremity: [Left:No] Temperature of Extremity: [Left:Warm] Capillary Refill: [Left:< 3 seconds] Dependent Rubor: [Left:No] Blanched when Elevated: [Left:No] Lipodermatosclerosis: [Left:No] Toe Nail Assessment Left: Right: Thick: Yes Discolored: Yes Deformed: Yes Improper Length and Hygiene: Yes KENDIA, PANCOAST ([id number]) Electronic Signature(s) Signed: 06/12/2016 7:45:01 PM By: Gretta Cool, RN, BSN, Kim RN, BSN Entered By: Gretta Cool, RN, BSN, Kim on 06/12/2016 16:46:06 [name], [name]  ([id number]) -------------------------------------------------------------------------------- Multi Wound Chart Details Patient Name: [name] Date of Service: 06/12/2016 3:30 PM Medical Record Number: [id number] Patient Account Number: [id number] Date of Birth/Sex: [birth year] (65 y.o. Female) Treating RN: Cornell Barman Primary Care Dajai Wahlert: LADA, [place] Other Clinician: Referring [place] Decoteau: LADA, [place] Treating Bufford Helms/Extender: Ricard Dillon Weeks in Treatment: 29 Vital Signs Height(in): 63 Pulse(bpm): 70 Weight(lbs): 270 Blood Pressure 142/72 (mmHg): Body Mass  Index(BMI): 48 Temperature(F): 98.0 Respiratory Rate 18 (breaths/min): Photos: [11:No Photos] [9:No Photos] [N/A:N/A] Wound Location: [11:Left, Anterior Lower Leg] [9:Left, Lateral Lower Leg] [N/A:N/A] Wounding Event: [11:Pressure Injury] [9:Shear/Friction] [N/A:N/A] Primary Etiology: [11:Pressure Ulcer] [9:Lymphedema] [N/A:N/A] Date Acquired: [11:06/04/2016] [9:05/09/2016] [N/A:N/A] Weeks of Treatment: [11:1] [9:4] [N/A:N/A] Wound Status: [11:Open] [9:Open] [N/A:N/A] Measurements L x W x D 1.2x1x0.1 [9:0.4x0.3x0.1] [N/A:N/A] (cm) Area (cm) : [11:0.942] [9:0.094] [N/A:N/A] Volume (cm) : [11:0.094] [id number] [N/A:N/A] % Reduction in Area: [11:-200.00%] [9:88.00%] [N/A:N/A] % Reduction in Volume: -203.20% [9:88.60%] [N/A:N/A] Classification: [11:Category/Stage II] [9:Partial Thickness] [N/A:N/A] Periwound Skin Texture: No Abnormalities Noted [9:No Abnormalities Noted] [N/A:N/A] Periwound Skin [11:No Abnormalities Noted] [9:No Abnormalities Noted] [N/A:N/A] Moisture: Periwound Skin Color: No Abnormalities Noted [9:No Abnormalities Noted] [N/A:N/A] Tenderness on [11:No] [9:No] [N/A:N/A] Treatment Notes Electronic Signature(s) Signed: 06/12/2016 5:24:26 PM By: Linton Ham MD Entered By: Linton Ham on 06/12/2016 16:57:02 Schwall, [name]  ([id number]) -------------------------------------------------------------------------------- Pain Assessment Details Patient Name: [name] Date of Service: 06/12/2016 3:30 PM Medical Record Number: [id number] Patient Account Number: [id number] Date of Birth/Sex: Jul 30, 1950 (66 y.o. Female) Treating RN: Cornell Barman Primary Care Caylyn Tedeschi: LADA, [facility] Other Clinician: Referring Zylie Mumaw: LADA, [place] Treating Anhad Sheeley/Extender: Tito Dine in Treatment: 29 Active Problems Location of Pain Severity and Description of Pain Patient Has Paino No Site Locations With Dressing Change: No Pain Management and Medication Current Pain Management: Electronic Signature(s) Signed: 06/12/2016 7:45:01 PM By: Gretta Cool, RN, BSN, Kim RN, BSN Entered By: Gretta Cool, RN, BSN, Kim on 06/12/2016 16:16:15 [name] ([id number]) -------------------------------------------------------------------------------- Wound Assessment Details Patient Name: [name] Date of Service: 06/12/2016 3:30 PM Medical Record Number: [id number] Patient Account Number: [id number] Date of Birth/Sex: August 18, 1950 (65 y.o. Female) Treating RN: Cornell Barman Primary Care Marketta Valadez: LADA, [place] Other Clinician: Referring Foye Damron: LADA, [place] Treating Nellie Chevalier/Extender: Ricard Dillon Weeks in Treatment: 29 Wound Status Wound Number: 11 Primary Etiology: Pressure Ulcer Wound Location: Left, Anterior Lower Leg Wound Status: Open Wounding Event: Pressure Injury Date Acquired: 06/04/2016 Weeks Of Treatment: 1 Clustered Wound: No Photos Photo Uploaded By: Alric Quan on 06/13/2016 07:51:58 Wound Measurements Length: (cm) 1.2 Width: (cm) 1 Depth: (cm) 0.1 Area: (cm) 0.942 Volume: (cm) 0.094 % Reduction in Area: -200% % Reduction in Volume: -203.2% Wound Description Classification: Category/Stage II Periwound Skin Texture Texture Color No Abnormalities Noted: No No Abnormalities Noted:  No Moisture No Abnormalities Noted: No Electronic Signature(s) Signed: 06/12/2016 7:45:01 PM By: Gretta Cool, RN, BSN, Kim RN, BSN [place], Neftaly Lemmie Evens ([id number]) Entered By: Gretta Cool, RN, BSN, Kim on 06/12/2016 16:45:11 [name] ([id number]) -------------------------------------------------------------------------------- Wound Assessment Details Patient Name: [name] Date of Service: 06/12/2016 3:30 PM Medical Record Number: [id number] Patient Account Number: [id number] Date of Birth/Sex: 06/01/1950 (65 y.o. Female) Treating RN: Cornell Barman Primary Care Jennifermarie Franzen: LADA, [place] Other Clinician: Referring Dellie Piasecki: LADA, [place] Treating Janeene Sand/Extender: Ricard Dillon Weeks in Treatment: 29 Wound Status Wound Number: 9 Primary Etiology: Lymphedema Wound Location: Left, Lateral Lower Leg Wound Status: Open Wounding Event: Shear/Friction Date Acquired: 05/09/2016 Weeks Of Treatment: 4 Clustered Wound: No Photos Photo Uploaded By: Alric Quan on 06/13/2016 07:51:58 Wound Measurements Length: (cm) 0.4 Width: (cm) 0.3 Depth: (cm) 0.1 Area: (cm) 0.094 Volume: (cm) 0.009 % Reduction in Area: 88% % Reduction in Volume: 88.6% Wound Description Classification: Partial Thickness Periwound Skin Texture Texture Color No Abnormalities Noted: No No Abnormalities Noted: No Moisture No Abnormalities Noted: No Electronic Signature(s) Signed: 06/12/2016 7:45:01 PM By: Gretta Cool, RN, BSN, Kim RN, BSN Rubis, Emmaclaire H. ([id number]) Entered By: Gretta Cool,  RN, BSN, Kim on 06/12/2016 16:45:11 DAPHNIE, KATCHER ([id number]) -------------------------------------------------------------------------------- Vitals Details Patient Name: [name] Date of Service: 06/12/2016 3:30 PM Medical Record Number: [id number] Patient Account Number: [id number] Date of Birth/Sex: 1950-05-16 (66 y.o. Female) Treating RN: Cornell Barman Primary Care Sarit Sparano: LADA, [place] Other Clinician: Referring  Carver Murakami: LADA, [place] Treating Zunairah Devers/Extender: Tito Dine in Treatment: 29 Vital Signs Time Taken: 16:16 Temperature (F): 98.0 Height (in): 63 Pulse (bpm): 70 Weight (lbs): 270 Respiratory Rate (breaths/min): 18 Body Mass Index (BMI): 47.8 Blood Pressure (mmHg): 142/72 Reference Range: 80 - 120 mg / dl Electronic Signature(s) Signed: 06/12/2016 7:45:01 PM By: Gretta Cool, RN, BSN, Kim RN, BSN Entered By: Gretta Cool, RN, BSN, Kim on 06/12/2016 16:16:38

## 2016-06-14 ENCOUNTER — Ambulatory Visit
Admission: RE | Admit: 2016-06-14 | Discharge: 2016-06-14 | Disposition: A | Discharge status: Home / Self Care | Payer: Medicare Other | Source: Ambulatory Visit | Attending: Family Medicine | Admitting: Family Medicine

## 2016-06-14 DIAGNOSIS — L03116 Cellulitis of left lower limb: Secondary | ICD-10-CM | POA: Diagnosis not present

## 2016-06-14 DIAGNOSIS — Z792 Long term (current) use of antibiotics: Secondary | ICD-10-CM | POA: Diagnosis not present

## 2016-06-14 DIAGNOSIS — L97419 Non-pressure chronic ulcer of right heel and midfoot with unspecified severity: Secondary | ICD-10-CM | POA: Diagnosis not present

## 2016-06-14 DIAGNOSIS — R590 Localized enlarged lymph nodes: Secondary | ICD-10-CM | POA: Diagnosis not present

## 2016-06-14 DIAGNOSIS — L97219 Non-pressure chronic ulcer of right calf with unspecified severity: Secondary | ICD-10-CM | POA: Diagnosis not present

## 2016-06-14 DIAGNOSIS — I70249 Atherosclerosis of native arteries of left leg with ulceration of unspecified site: Secondary | ICD-10-CM | POA: Diagnosis not present

## 2016-06-14 DIAGNOSIS — L97929 Non-pressure chronic ulcer of unspecified part of left lower leg with unspecified severity: Secondary | ICD-10-CM | POA: Diagnosis not present

## 2016-06-14 DIAGNOSIS — G35 Multiple sclerosis: Secondary | ICD-10-CM | POA: Diagnosis not present

## 2016-06-14 DIAGNOSIS — I70234 Atherosclerosis of native arteries of right leg with ulceration of heel and midfoot: Secondary | ICD-10-CM | POA: Diagnosis not present

## 2016-06-14 DIAGNOSIS — J449 Chronic obstructive pulmonary disease, unspecified: Secondary | ICD-10-CM | POA: Diagnosis not present

## 2016-06-14 DIAGNOSIS — I10 Essential (primary) hypertension: Secondary | ICD-10-CM | POA: Diagnosis not present

## 2016-06-14 DIAGNOSIS — I70232 Atherosclerosis of native arteries of right leg with ulceration of calf: Secondary | ICD-10-CM | POA: Diagnosis not present

## 2016-06-14 DIAGNOSIS — R339 Retention of urine, unspecified: Secondary | ICD-10-CM | POA: Diagnosis not present

## 2016-06-14 MED ORDER — IOPAMIDOL (ISOVUE-300) INJECTION 61%
75.0000 mL | Freq: Once | INTRAVENOUS | Status: AC | PRN
  Administered 2016-06-14: 75 mL via INTRAVENOUS

## 2016-06-16 NOTE — Assessment & Plan Note (Signed)
Supportive listening provided; encouragement given

## 2016-06-17 DIAGNOSIS — L97219 Non-pressure chronic ulcer of right calf with unspecified severity: Secondary | ICD-10-CM | POA: Diagnosis not present

## 2016-06-17 DIAGNOSIS — I70232 Atherosclerosis of native arteries of right leg with ulceration of calf: Secondary | ICD-10-CM | POA: Diagnosis not present

## 2016-06-17 DIAGNOSIS — I10 Essential (primary) hypertension: Secondary | ICD-10-CM | POA: Diagnosis not present

## 2016-06-17 DIAGNOSIS — Z792 Long term (current) use of antibiotics: Secondary | ICD-10-CM | POA: Diagnosis not present

## 2016-06-17 DIAGNOSIS — L03116 Cellulitis of left lower limb: Secondary | ICD-10-CM | POA: Diagnosis not present

## 2016-06-17 DIAGNOSIS — L97929 Non-pressure chronic ulcer of unspecified part of left lower leg with unspecified severity: Secondary | ICD-10-CM | POA: Diagnosis not present

## 2016-06-17 DIAGNOSIS — G35 Multiple sclerosis: Secondary | ICD-10-CM | POA: Diagnosis not present

## 2016-06-17 DIAGNOSIS — I70234 Atherosclerosis of native arteries of right leg with ulceration of heel and midfoot: Secondary | ICD-10-CM | POA: Diagnosis not present

## 2016-06-17 DIAGNOSIS — J449 Chronic obstructive pulmonary disease, unspecified: Secondary | ICD-10-CM | POA: Diagnosis not present

## 2016-06-17 DIAGNOSIS — R339 Retention of urine, unspecified: Secondary | ICD-10-CM | POA: Diagnosis not present

## 2016-06-17 DIAGNOSIS — L97419 Non-pressure chronic ulcer of right heel and midfoot with unspecified severity: Secondary | ICD-10-CM | POA: Diagnosis not present

## 2016-06-17 DIAGNOSIS — I70249 Atherosclerosis of native arteries of left leg with ulceration of unspecified site: Secondary | ICD-10-CM | POA: Diagnosis not present

## 2016-06-18 ENCOUNTER — Ambulatory Visit (INDEPENDENT_AMBULATORY_CARE_PROVIDER_SITE_OTHER): Payer: Medicare Other | Admitting: Family Medicine

## 2016-06-18 ENCOUNTER — Encounter: Payer: Self-pay | Admitting: Family Medicine

## 2016-06-18 ENCOUNTER — Encounter: Payer: Medicare Other | Admitting: Internal Medicine

## 2016-06-18 DIAGNOSIS — L97512 Non-pressure chronic ulcer of other part of right foot with fat layer exposed: Secondary | ICD-10-CM | POA: Diagnosis not present

## 2016-06-18 DIAGNOSIS — G35 Multiple sclerosis: Secondary | ICD-10-CM | POA: Diagnosis not present

## 2016-06-18 DIAGNOSIS — R1900 Intra-abdominal and pelvic swelling, mass and lump, unspecified site: Secondary | ICD-10-CM | POA: Diagnosis not present

## 2016-06-18 DIAGNOSIS — G4733 Obstructive sleep apnea (adult) (pediatric): Secondary | ICD-10-CM | POA: Diagnosis not present

## 2016-06-18 DIAGNOSIS — L97821 Non-pressure chronic ulcer of other part of left lower leg limited to breakdown of skin: Secondary | ICD-10-CM | POA: Diagnosis not present

## 2016-06-18 DIAGNOSIS — I10 Essential (primary) hypertension: Secondary | ICD-10-CM | POA: Diagnosis not present

## 2016-06-18 DIAGNOSIS — L03119 Cellulitis of unspecified part of limb: Secondary | ICD-10-CM

## 2016-06-18 DIAGNOSIS — L043 Acute lymphadenitis of lower limb: Secondary | ICD-10-CM | POA: Diagnosis not present

## 2016-06-18 DIAGNOSIS — I739 Peripheral vascular disease, unspecified: Secondary | ICD-10-CM | POA: Diagnosis not present

## 2016-06-18 DIAGNOSIS — J449 Chronic obstructive pulmonary disease, unspecified: Secondary | ICD-10-CM | POA: Diagnosis not present

## 2016-06-18 DIAGNOSIS — R590 Localized enlarged lymph nodes: Secondary | ICD-10-CM | POA: Diagnosis not present

## 2016-06-18 DIAGNOSIS — Z79899 Other long term (current) drug therapy: Secondary | ICD-10-CM | POA: Diagnosis not present

## 2016-06-18 DIAGNOSIS — L97222 Non-pressure chronic ulcer of left calf with fat layer exposed: Secondary | ICD-10-CM | POA: Diagnosis not present

## 2016-06-18 DIAGNOSIS — L97212 Non-pressure chronic ulcer of right calf with fat layer exposed: Secondary | ICD-10-CM | POA: Diagnosis not present

## 2016-06-18 NOTE — Patient Instructions (Signed)
Let's get this worked up

## 2016-06-18 NOTE — Progress Notes (Signed)
BP 124/68   Pulse 74   Temp 100 F (37.8 C) (Oral)   Resp 16   Wt 294 lb 8 oz (133.6 kg)   SpO2 94%   BMI 50.55 kg/m    Subjective:    Patient ID: [name], female    DOB: Apr 29, 1951, 66 y.o.   MRN: [id number]  HPI: [name] is a 66 y.o. female  Chief Complaint  Patient presents with  . Results    reveiw  CT scan results   Patient is here for review of her CT scan We reviewed her vitals; she has had nothing hot to drink right before visit; not feverish or chilled She is seeing wound care, the last one that changed the bandage said it was closed and it is now a three layer wrap Cellulitis in the leg is not as bad as it was Going to wound center when she leaves here  --------------------------------- CT scan abdomen and pelvis done Jun 14, 2016: CLINICAL DATA:  Left pelvic and leg swelling for several months, history of left inguinal lymphadenopathy  EXAM: CT ABDOMEN AND PELVIS WITH CONTRAST  TECHNIQUE: Multidetector CT imaging of the abdomen and pelvis was performed using the standard protocol following bolus administration of intravenous contrast.  CONTRAST:  66mL ISOVUE-300 IOPAMIDOL (ISOVUE-300) INJECTION 61%  COMPARISON:  05/24/2016  FINDINGS: Lower chest: No acute abnormality.  Hepatobiliary: No focal liver abnormality is seen. No gallstones, gallbladder wall thickening, or biliary dilatation.  Pancreas: Unremarkable. No pancreatic ductal dilatation or surrounding inflammatory changes.  Spleen: Normal in size without focal abnormality.  Adrenals/Urinary Tract: The adrenal glands are within normal limits bilaterally. Fullness of the collecting system is is noted bilaterally without definitive obstructing stone. The bladder is decompressed by Foley catheter.  Stomach/Bowel: Fecal material is noted throughout the colon. No obstructive changes are seen. The appendix is within normal limits without inflammatory  changes.  Vascular/Lymphatic: Aortic calcifications are noted without aneurysmal dilatation. Considerable lymphadenopathy is noted in the left inguinal region. The largest of these measures 6.1 by 3.0 cm in greatest dimension. This is similar to that seen on the prior ultrasound examination and remains suspicious. Additionally a lymphomatous mass is noted within the left hemipelvis which measures approximately 10 by 4.5 cm. Scattered left iliac and periaortic lymph nodes are seen. The periaortic lymph nodes are not significantly enlarged. The other left iliac chain nodes measure upwards of 18 mm.  Reproductive: The uterus is within normal limits. Cystic change in the left ovary is noted. This is of uncertain chronicity  Other: No free pelvic fluid is noted.  Musculoskeletal: Degenerative changes of the lumbar spine are seen. Postsurgical changes are noted.  IMPRESSION: Lymphadenopathy within the left inguinal region and the left iliac chain. This is highly suspicious for underlying neoplasm. Biopsy is recommended for further evaluation.  Chronic changes as described above   Electronically Signed   By: Inez Catalina M.D.   On: 06/14/2016 14:24   Depression screen [facility] 2/9 06/18/2016 06/07/2016 12/22/2015 11/08/2015 04/18/2015  Decreased Interest 0 0 3 1 1   Down, Depressed, Hopeless 0 1 3 3 2   PHQ - 2 Score 0 1 6 4 3   Altered sleeping - - 0 1 2  Tired, decreased energy - - 3 3 1   Change in appetite - - 0 3 0  Feeling bad or failure about yourself  - - 3 2 0  Trouble concentrating - - 3 3 1   Moving slowly or fidgety/restless - -  0 0 0  Suicidal thoughts - - 0 0 0  PHQ-9 Score - - 15 16 7   Difficult doing work/chores - - Very difficult Somewhat difficult -   Relevant past medical, surgical, family and social history reviewed Past Medical History:  Diagnosis Date  . Anxiety   . COPD (chronic obstructive pulmonary disease) ([place])   . Depression   . Hypertension   .  Kyphoscoliosis and scoliosis 11/26/2011  . Morbid obesity ([place]) 01/05/2016  . Multiple sclerosis ([place])   . Neuromuscular disorder ([place])   . Peripheral vascular disease of lower extremity with ulceration ([place]) 11/08/2015  . Skin ulcer ([place]) 11/08/2015   Past Surgical History:  Procedure Laterality Date  . BACK SURGERY N/A 2002  . TONSILLECTOMY AND ADENOIDECTOMY    . TUBAL LIGATION     Family History  Problem Relation Age of Onset  . COPD Mother   . Diabetes Mother   . Heart failure Mother   . Alcohol abuse Father   . Kidney disease Father   . Kidney failure Father   . Arthritis Sister   . CAD Maternal Grandmother   . Stroke Maternal Grandfather   . Diabetes Sister    Social History  Substance Use Topics  . Smoking status: Former Smoker    Types: Cigarettes  . Smokeless tobacco: Never Used  . Alcohol use 0.0 oz/week     Comment: Pt states occasionally (liquor)   Interim medical history since last visit reviewed. Allergies and medications reviewed  Review of Systems Per HPI unless specifically indicated above     Objective:    BP 124/68   Pulse 74   Temp 100 F (37.8 C) (Oral)   Resp 16   Wt 294 lb 8 oz (133.6 kg)   SpO2 94%   BMI 50.55 kg/m   Wt Readings from Last 3 Encounters:  06/18/16 294 lb 8 oz (133.6 kg)  05/29/16 280 lb (127 kg)  02/28/16 287 lb 5 oz (130.3 kg)    Physical Exam  Constitutional: She appears well-developed and well-nourished. No distress.  Weight gain noted; morbidly obese  HENT:  Mouth/Throat: Mucous membranes are normal.  Eyes: No scleral icterus.  Cardiovascular: Normal rate and regular rhythm.   Pulmonary/Chest: Effort normal and breath sounds normal.  Musculoskeletal:  Left leg wrapped  Psychiatric:  Affect a little flat   Results for orders placed or performed during the hospital encounter of 05/29/16  CBC with Differential/Platelet  Result Value Ref Range   WBC 5.9 3.6 - 11.0 K/uL   RBC 4.01 3.80 - 5.20 MIL/uL    Hemoglobin 11.5 (L) 12.0 - 16.0 g/dL   HCT 33.8 (L) 35.0 - 47.0 %   MCV 84.2 80.0 - 100.0 fL   MCH 28.8 26.0 - 34.0 pg   MCHC 34.2 32.0 - 36.0 g/dL   RDW 15.0 (H) 11.5 - 14.5 %   Platelets 241 150 - 440 K/uL   Neutrophils Relative % 66 %   Neutro Abs 3.9 1.4 - 6.5 K/uL   Lymphocytes Relative 17 %   Lymphs Abs 1.0 1.0 - 3.6 K/uL   Monocytes Relative 10 %   Monocytes Absolute 0.6 0.2 - 0.9 K/uL   Eosinophils Relative 5 %   Eosinophils Absolute 0.3 0 - 0.7 K/uL   Basophils Relative 2 %   Basophils Absolute 0.1 0 - 0.1 K/uL  Basic metabolic panel  Result Value Ref Range   Sodium 136 135 - 145 mmol/L   Potassium 3.5  3.5 - 5.1 mmol/L   Chloride 102 101 - 111 mmol/L   CO2 27 22 - 32 mmol/L   Glucose, Bld 115 (H) 65 - 99 mg/dL   BUN 22 (H) 6 - 20 mg/dL   Creatinine, Ser 1.36 (H) 0.44 - 1.00 mg/dL   Calcium 8.8 (L) 8.9 - 10.3 mg/dL   GFR calc non Af Amer 40 (L) >60 mL/min   GFR calc Af Amer 46 (L) >60 mL/min   Anion gap 7 5 - 15  Urinalysis, Complete w Microscopic  Result Value Ref Range   Color, Urine STRAW (A) YELLOW   APPearance CLEAR (A) CLEAR   Specific Gravity, Urine 1.005 1.005 - 1.030   pH 6.0 5.0 - 8.0   Glucose, UA NEGATIVE NEGATIVE mg/dL   Hgb urine dipstick SMALL (A) NEGATIVE   Bilirubin Urine NEGATIVE NEGATIVE   Ketones, ur NEGATIVE NEGATIVE mg/dL   Protein, ur NEGATIVE NEGATIVE mg/dL   Nitrite NEGATIVE NEGATIVE   Leukocytes, UA TRACE (A) NEGATIVE   RBC / HPF 0-5 0 - 5 RBC/hpf   WBC, UA 0-5 0 - 5 WBC/hpf   Bacteria, UA NONE SEEN NONE SEEN   Squamous Epithelial / LPF 0-5 (A) NONE SEEN      Assessment & Plan:   Problem List Items Addressed This Visit      Immune and Lymphatic   Inguinal adenopathy    Refer to onc, surgery      Relevant Orders   AMB referral to Navigator   Ambulatory referral to Interventional Radiology   Ambulatory referral to Oncology     Other   Pelvic mass in female    Refer to nurse navigator, interventional radiologist, and  oncologist; reviewed CT scan in detail with patient; I am here for her if questions arise, support needed      Relevant Orders   AMB referral to Navigator   Ambulatory referral to Interventional Radiology   Ambulatory referral to Oncology   Cellulitis    Chronic left leg cellulitis; to wound care center after visit here         Follow up plan: No Follow-up on file.  An after-visit summary was printed and given to the patient at [place].  Please see the patient instructions which may contain other information and recommendations beyond what is mentioned above in the assessment and plan.  Meds ordered this encounter  Medications  . hydrochlorothiazide (MICROZIDE) 12.5 MG capsule    Sig: Take 12.5 mg by mouth daily.    Orders Placed This Encounter  Procedures  . AMB referral to Navigator  . Ambulatory referral to Interventional Radiology  . Ambulatory referral to Oncology

## 2016-06-18 NOTE — Assessment & Plan Note (Addendum)
Refer to nurse navigator, interventional radiologist, and oncologist; reviewed CT scan in detail with patient; I am here for her if questions arise, support needed

## 2016-06-18 NOTE — Assessment & Plan Note (Signed)
Refer to onc, surgery

## 2016-06-19 DIAGNOSIS — F329 Major depressive disorder, single episode, unspecified: Secondary | ICD-10-CM | POA: Diagnosis not present

## 2016-06-19 DIAGNOSIS — R2681 Unsteadiness on feet: Secondary | ICD-10-CM | POA: Diagnosis not present

## 2016-06-19 DIAGNOSIS — Z836 Family history of other diseases of the respiratory system: Secondary | ICD-10-CM | POA: Diagnosis not present

## 2016-06-19 DIAGNOSIS — R296 Repeated falls: Secondary | ICD-10-CM | POA: Diagnosis not present

## 2016-06-19 DIAGNOSIS — F909 Attention-deficit hyperactivity disorder, unspecified type: Secondary | ICD-10-CM | POA: Diagnosis not present

## 2016-06-19 DIAGNOSIS — F419 Anxiety disorder, unspecified: Secondary | ICD-10-CM | POA: Diagnosis not present

## 2016-06-19 DIAGNOSIS — Z9889 Other specified postprocedural states: Secondary | ICD-10-CM | POA: Diagnosis not present

## 2016-06-19 DIAGNOSIS — M6281 Muscle weakness (generalized): Secondary | ICD-10-CM | POA: Diagnosis not present

## 2016-06-19 DIAGNOSIS — R1030 Lower abdominal pain, unspecified: Secondary | ICD-10-CM | POA: Diagnosis not present

## 2016-06-19 DIAGNOSIS — G4730 Sleep apnea, unspecified: Secondary | ICD-10-CM | POA: Diagnosis not present

## 2016-06-19 DIAGNOSIS — R590 Localized enlarged lymph nodes: Secondary | ICD-10-CM | POA: Diagnosis not present

## 2016-06-19 DIAGNOSIS — L97829 Non-pressure chronic ulcer of other part of left lower leg with unspecified severity: Secondary | ICD-10-CM | POA: Diagnosis not present

## 2016-06-19 DIAGNOSIS — R339 Retention of urine, unspecified: Secondary | ICD-10-CM | POA: Diagnosis not present

## 2016-06-19 DIAGNOSIS — Z9181 History of falling: Secondary | ICD-10-CM | POA: Diagnosis not present

## 2016-06-19 DIAGNOSIS — C8215 Follicular lymphoma grade II, lymph nodes of inguinal region and lower limb: Secondary | ICD-10-CM | POA: Diagnosis not present

## 2016-06-19 DIAGNOSIS — L97512 Non-pressure chronic ulcer of other part of right foot with fat layer exposed: Secondary | ICD-10-CM | POA: Diagnosis not present

## 2016-06-19 DIAGNOSIS — Z9851 Tubal ligation status: Secondary | ICD-10-CM | POA: Diagnosis not present

## 2016-06-19 DIAGNOSIS — Z823 Family history of stroke: Secondary | ICD-10-CM | POA: Diagnosis not present

## 2016-06-19 DIAGNOSIS — G834 Cauda equina syndrome: Secondary | ICD-10-CM | POA: Diagnosis not present

## 2016-06-19 DIAGNOSIS — I129 Hypertensive chronic kidney disease with stage 1 through stage 4 chronic kidney disease, or unspecified chronic kidney disease: Secondary | ICD-10-CM | POA: Diagnosis not present

## 2016-06-19 DIAGNOSIS — Z8669 Personal history of other diseases of the nervous system and sense organs: Secondary | ICD-10-CM | POA: Diagnosis not present

## 2016-06-19 DIAGNOSIS — Z6841 Body Mass Index (BMI) 40.0 and over, adult: Secondary | ICD-10-CM | POA: Diagnosis not present

## 2016-06-19 DIAGNOSIS — F17218 Nicotine dependence, cigarettes, with other nicotine-induced disorders: Secondary | ICD-10-CM | POA: Diagnosis not present

## 2016-06-19 DIAGNOSIS — I10 Essential (primary) hypertension: Secondary | ICD-10-CM | POA: Diagnosis not present

## 2016-06-19 DIAGNOSIS — M5416 Radiculopathy, lumbar region: Secondary | ICD-10-CM | POA: Diagnosis not present

## 2016-06-19 DIAGNOSIS — G4733 Obstructive sleep apnea (adult) (pediatric): Secondary | ICD-10-CM | POA: Diagnosis not present

## 2016-06-19 DIAGNOSIS — Z811 Family history of alcohol abuse and dependence: Secondary | ICD-10-CM | POA: Diagnosis not present

## 2016-06-19 DIAGNOSIS — I700 Atherosclerosis of aorta: Secondary | ICD-10-CM | POA: Diagnosis not present

## 2016-06-19 DIAGNOSIS — M419 Scoliosis, unspecified: Secondary | ICD-10-CM | POA: Diagnosis not present

## 2016-06-19 DIAGNOSIS — L97212 Non-pressure chronic ulcer of right calf with fat layer exposed: Secondary | ICD-10-CM | POA: Diagnosis not present

## 2016-06-19 DIAGNOSIS — I739 Peripheral vascular disease, unspecified: Secondary | ICD-10-CM | POA: Diagnosis not present

## 2016-06-19 DIAGNOSIS — J449 Chronic obstructive pulmonary disease, unspecified: Secondary | ICD-10-CM | POA: Diagnosis not present

## 2016-06-19 DIAGNOSIS — Z87891 Personal history of nicotine dependence: Secondary | ICD-10-CM | POA: Diagnosis not present

## 2016-06-19 DIAGNOSIS — N183 Chronic kidney disease, stage 3 (moderate): Secondary | ICD-10-CM | POA: Diagnosis not present

## 2016-06-19 DIAGNOSIS — R32 Unspecified urinary incontinence: Secondary | ICD-10-CM | POA: Diagnosis not present

## 2016-06-19 DIAGNOSIS — M7989 Other specified soft tissue disorders: Secondary | ICD-10-CM | POA: Diagnosis not present

## 2016-06-19 DIAGNOSIS — Z841 Family history of disorders of kidney and ureter: Secondary | ICD-10-CM | POA: Diagnosis not present

## 2016-06-19 DIAGNOSIS — N139 Obstructive and reflux uropathy, unspecified: Secondary | ICD-10-CM | POA: Diagnosis not present

## 2016-06-19 DIAGNOSIS — L043 Acute lymphadenitis of lower limb: Secondary | ICD-10-CM | POA: Diagnosis not present

## 2016-06-19 DIAGNOSIS — Z8261 Family history of arthritis: Secondary | ICD-10-CM | POA: Diagnosis not present

## 2016-06-19 DIAGNOSIS — G35 Multiple sclerosis: Secondary | ICD-10-CM | POA: Diagnosis not present

## 2016-06-19 DIAGNOSIS — R498 Other voice and resonance disorders: Secondary | ICD-10-CM | POA: Diagnosis not present

## 2016-06-19 DIAGNOSIS — E876 Hypokalemia: Secondary | ICD-10-CM | POA: Diagnosis not present

## 2016-06-19 DIAGNOSIS — E669 Obesity, unspecified: Secondary | ICD-10-CM | POA: Diagnosis not present

## 2016-06-19 DIAGNOSIS — L97929 Non-pressure chronic ulcer of unspecified part of left lower leg with unspecified severity: Secondary | ICD-10-CM | POA: Diagnosis not present

## 2016-06-19 DIAGNOSIS — Z7951 Long term (current) use of inhaled steroids: Secondary | ICD-10-CM | POA: Diagnosis not present

## 2016-06-19 DIAGNOSIS — R599 Enlarged lymph nodes, unspecified: Secondary | ICD-10-CM | POA: Diagnosis not present

## 2016-06-19 DIAGNOSIS — Z8249 Family history of ischemic heart disease and other diseases of the circulatory system: Secondary | ICD-10-CM | POA: Diagnosis not present

## 2016-06-19 DIAGNOSIS — Z5189 Encounter for other specified aftercare: Secondary | ICD-10-CM | POA: Diagnosis not present

## 2016-06-19 DIAGNOSIS — Z8619 Personal history of other infectious and parasitic diseases: Secondary | ICD-10-CM | POA: Diagnosis not present

## 2016-06-19 DIAGNOSIS — Z79899 Other long term (current) drug therapy: Secondary | ICD-10-CM | POA: Diagnosis not present

## 2016-06-19 DIAGNOSIS — Z833 Family history of diabetes mellitus: Secondary | ICD-10-CM | POA: Diagnosis not present

## 2016-06-19 DIAGNOSIS — C8205 Follicular lymphoma grade I, lymph nodes of inguinal region and lower limb: Secondary | ICD-10-CM | POA: Diagnosis not present

## 2016-06-19 NOTE — Progress Notes (Signed)
CHARYN, HAERING ([id number]) Visit Report for 06/18/2016 Chief Complaint Document Details Patient Name: [name], [name] Date of Service: 06/18/2016 3:30 PM Medical Record Patient Account Number: [id number] Number: Treating RN: Baruch Gouty, RN, BSN, Rita 03-14-1951 [phone] y.o. Other Clinician: Date of Birth/Sex: Female) Treating Damario Gillie Primary Care Provider: LADA, MELINDA Provider/Extender: G Referring Provider: LADA, MELINDA Weeks in Treatment: 30 Information Obtained from: Patient Chief Complaint returns for evaluation of bilateral lower extremity ulcerations Electronic Signature(s) Signed: 06/19/2016 9:58:44 AM By: Linton Ham MD Entered By: Linton Ham on 06/19/2016 08:02:49 [name] -------------------------------------------------------------------------------- HPI Details Patient Name: [name] Date of Service: 06/18/2016 3:30 PM Medical Record Patient Account Number: [id number] Number: Treating RN: Baruch Gouty, RN, BSN, Rita 04/28/1951 (66 y.o. Other Clinician: Date of Birth/Sex: Female) Treating Ursula Dermody Primary Care Provider: LADA, MELINDA Provider/Extender: G Referring Provider: LADA, MELINDA Weeks in Treatment: 30 History of Present Illness Location: left and right lower extremity and dorsum of the right foot Quality: Patient reports experiencing a sharp pain to affected area(s). Severity: Patient rates the pain to be a 8 out of 10 especially with palpation Duration: Patient has had the wound for > 6 months prior to seeking treatment at the wound center Timing: Pain in wound is Intermittent in severity but persistent Context: The wound appeared gradually over time Modifying Factors: Other treatment(s) tried include: symptomatic treatment as advised by her PCP Associated Signs and Symptoms: Patient reports having increase swelling iin her bilateral lower extremities HPI Description: 66 year old with a past medical  history significant for MS, urinary incontinence, and obesity. She has been seen in the wound clinic before for lower extremity ulcerations treated with compression therapy. she is also known to have hypertension, peripheral vascular disease, COPD, obstructive sleep apnea, lumbar radiculopathy, kyphoscoliosis, urinary issues and tobacco abuse. Smokes a packet of cigarettes a day was recently seen at the [facility] for swelling of her legs and feet with a ulceration on the dorsum of the right foot which has been there for about 6 months. she was recently in the ER about a month ago where she was seen for shortness of breath and swelling of the legs and a chest x-ray was within normal limits had an increase in her BNP and was given Lasix and put on doxycycline for a mild cellulitis and possible UTI. Wounds aresmaller today 11/13/15. Debrided and will continue Santyl. 12/21/2015 -- she was admitted to the hospital overnight on 12/18/2015 and diagnosed with urinary retention and cellulitis of the left lower leg. is past to take clindamycin and use Santyl for her wounds. 01/15/2016 -- come back to see [place] for almost a month and continues to be noncompliant with her dressings 01/30/16 patient presents today for a follow-up visit concerning her ongoing bilateral lower extremity wounds. We have not seen her for the past 2 weeks although we are supposed to be seen her for weekly visits. She currently has a Foley catheter. She tells me that the wounds are intensely painful especially with pressure and palpation at this point in time. Fortunately she is having no interval signs or symptoms of systemic infection but unfortunately the wounds have not improved dramatically since we last saw her. She does have home health coming to take care of her as well. She is currently not in any compression wraps. 02/06/16 ON evaluation today patient continues to experience discomfort regarding her bilateral  lower extremity ulcers. She has continued to tolerate the dressing changes at this point  in time and continues to have a Foley catheter as well. Fortunately she is back this week in the past she has been somewhat noncompliant with follow-ups I'm glad to see her today. She tells me that her pain level varies but can be as high as a 7 out of 10 with manipulation of the wound. She tells me that she used to be on oxycodone which Sugg, Tynslee H. ([id number]) was managed by the pain clinic although she is no longer on that as she tells me that she was actually smoking marijuana at the time and when this was found out they discontinued her pain medication. She no longer is taking anything pain medication wise and she also does not smoke cigarettes nor marijuana at this point. 02/12/16; this is a patient I don't believe I have previously seen. She has multiple sclerosis. She has 4 punched-out areas on the anterior lateral left leg 2 areas on the right these are all in the same condition. Reasonably small [dime size] wounds each was some depth. Surface of these wounds does not look particularly healthy as there is adherent slough. There is no evidence of surrounding infection or inflammation. ABIs in this clinic were 0.87 on the right and 0.81 on the left. She is listed as having PAD and is a smoker. Not a diabetic 02/20/16 patient I gave antibiotics to last week for erythema around both wound sites on the left lateral leg and right lateral leg. This appears to be a lot better. One of the areas on the left leg is healed however she still has 3 punched-out open areas on the left lateral calf and one on the right lateral calf and one on the dorsal foot. She is an ex-smoker quitting 1 month ago. ABIs in this clinic were 0.87 on the right 0.81 on the left 03/12/16; this is a patient I really don't have a good sense of. It would appear for the first 5 or 6 weeks of her stay in this clinic she was cared for by  Dr. Con Memos. She appeared on my schedule in mid October and I saw her twice. She has not been here however in 3 weeks. She has advanced Wound Care at home where she lives with her husband. She has multiple sclerosis. I saw her the first time she had 4 punched-out small wounds on the anterior lateral left leg it appears that she is now down to 2. She also had wounds on the lateral and medial aspect of her right leg which were also small and punched out. The only one that remains is on the right lateral. Because of the nature of her wound I went ahead and ordered formal arterial studies this showed an ABI of 1 on the right and one on the left. TBI of 1.4 on the right and 0.79 on the left. She had normal waveforms. She was in the emergency room on 02/28/16; there is a noted edema of her left leg after a fall. She had a duplex ultrasound that showed no evidence for an acute DVT from the left groin to the popliteal fossa. The study was limited in the calf veins due to edema. Also noticeable that she had a left inguinal enlarged lymph node up to 6.2 cm. She also had a left knee x-ray that showed no acute findings and a right foot x-ray that showed soft tissue swelling but no radiographic evidence of osteomyelitis. The patient does stop smoking 03-19-16 Ms Pett presents today for evaluation  of her bilateral lower extremity ulcerations. She states that she has not smoked in several weeks. She denies any pain or discomfort to bilateral lower extremities, tolerating compression therapy as ordered. 03/26/16; I have not seen this patient in 2 weeks however she has done very well with improvements in the areas in question. After we obtain normal arterial studies, increasing the 4-layer compression really seems to look done a nice job here. She has one open area on the left lateral leg and one on the medial right leg. These have filled in nicely and are now superficial wounds that showed epithelialized. I note  the left inguinal lymph node at 6.2 cm. I may need to refer this back to the patient's primary doctor. 04/03/16; patient has 2 remaining wounds 1 on the left lateral leg and one on the right medial leg. Both of filled in nicely since we are able to increase her from a 3 layer to a 4 layer compression. I am also following up on the lymph node notable on her left leg DVT rule out in November 04/10/16; area on the right lateral leg is healed. The area on the left leg is still open but looks considerably better with healthy granulation and less wound area. I12/20/17; last week we healed the patient doubt with regards to the wound on the right leg to her on compression stocking 20-30 mmHg as it turns out I don't think she had a stocking, predictably therefore she has reopened. The left leg as well as the wound on the lateral aspect. Been generally small line 04/24/16; we healed out her right leg 2 weeks ago to her own 20-30 mm compression stockings although as it turns out she didn't have these and did not purchase some apparently because of financial issues. The left leg wound is on the lateral aspect. Both of these wounds are just about healed. 05/01/16; her wounds on the left leg are healed out today. The area on the right leg is also healed. Vitamin diligent effort of our staff we have not been able to get any form of compression stocking to this patient. The SHERISSA, GRAVELY ([id number]) orders for juxtalite's were given to home health this never materialized. We ordered compression stockings I don't think she was able to afford these. We had a discussion today with both the patient and her husband without these, will accumulate edema and the wounds will simply reopen again. 05/14/16 READMISSION this is a patient we healed out 2 weeks ago. As bilateral lower extremity venous wounds probably some degree of lymphedema. At the beginning in November I did a duplex ultrasound of her left leg that  was negative for DVT but did show a lymph node in the left inguinal area presumably reactive. We've been using compression to her lower extremities eventually healed all her wounds on her bilateral calves but we were not able to get any form of compression stockings for her in spite of intense efforts of our staff. She returns today with reopening on the left leg did not the right she has several superficial areas anteriorly but an actual frank ulcer on the lateral left calf. This is about the size of a dime or smaller. She does not really complain of pain. The patient has a history of PAD however arterial studies we did in November were normal. Her ABIs and Doppler waveforms were both within the normal limits. The patient has a history of MS. In discussing things with her today it would  appear that the opening was noted 2 weeks ago according to the patient. We gave her Tubigrip stockings when she left the clinic 05/21/16; she has not yet had the duplex ultrasound of her thigh, this is booked for Thursday. The wound on the left lateral lower leg is improved 05/28/16; her duplex ultrasound of the left leg specifically her left thigh did not show a DVT however did show hypoechogenic enlarged left inguinal lymph nodes up to 6.9 x 3.7 x 6.8. This also showed in the one in November however maximal diameter was 6.2, at that point I thought these were reactive secondary to cellulitis however there is obviously a differential here. Her wound on the left lower leg is closed. She has a superficial open area on the base of her left fifth metatarsal head on the plantar foot. It looks as though she has some wrap injuries on the anterior leg 06/05/16; since she was last year she had a fall on 1/31. She was seen in the ER but sent back home. I think she is also been seen by her primary physician who picked up on the swelling and the lymphadenopathy. She is ordered a CT scan of the abdomen and pelvis however this  will be without contrast because of stage IIIB renal insufficiency. Nevertheless this should be a start the workup to make sure there is not is more systemic problem here. She will probably need consideration of a biopsy of the lymph node in her inguinal area, this would need general surgery. As far as her wounds are concern today the area on the left plantar foot is healed. She has a small weeping area on the left lower leg. The wrap injury from last week has largely Pardoxically there is a lot less edema in the left thigh. 06/12/16; CT scan is on Friday. She has 2 small open areas one on the lateral left lower leg and one on the anterior lower leg on the left. Both of these looks healthy. 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 Other Clinician: Date of Birth/Sex: Female) Treating Zeniyah Peaster Primary Care Provider: LADA, MELINDA Provider/Extender: G Referring Provider: LADA, MELINDA Weeks in Treatment: 30 Constitutional Sitting or standing Blood Pressure is within target range for patient.. Pulse regular and within target range for patient.Marland Kitchen Respirations regular, non-labored and within target range.. Temperature is normal and within the target range for the patient.. Patient looks somewhat tired and gaunt. Respiratory Respiratory effort is easy and symmetric bilaterally. Rate is normal at rest and on room air.. Cardiovascular Even through the swelling I think I can feel her pedal pulses.  Left lower leg swelling appears stable with just 2 layer compression. Her upper leg still has left greater than right edema which is probably lymphedema. Lymphatic Nonpalpable in the left popliteal or inguinal area. Psychiatric Patient appears depressed today.. Notes Wound exam; the patient has 1 remaining wound which appears to be healthy on the left lateral lower leg. There is no evidence of infection. The one on her anterior leg is closed Electronic Signature(s) Signed: 06/19/2016 9:58:44 AM By: Linton Ham MD Entered By: Linton Ham on  06/19/2016 08:04:58 KEIMARI, POINTER ([id number]) -------------------------------------------------------------------------------- Physician Orders Details Patient Name: [name] Date of Service: 06/18/2016 3:30 PM Medical Record Patient Account Number: [id number] Number: Treating RN: Baruch Gouty, RN, BSN, Rita May 20, 1950 [phone] y.o. Other Clinician: Date of Birth/Sex: Female) Treating Bowyn Mercier Primary Care Provider: LADA, MELINDA Provider/Extender: G Referring Provider: LADA, MELINDA Weeks in Treatment: 30 Verbal / Phone Orders: No Diagnosis Coding Wound Cleansing Wound #9 Left,Lateral Lower Leg o Clean wound with Normal Saline. o Cleanse wound with mild soap and water o No tub bath. Primary Wound Dressing Wound #9 Left,Lateral Lower Leg o Aquacel Ag Secondary Dressing Wound #9 Left,Lateral Lower Leg o ABD pad Dressing Change Frequency Wound #9 Left,Lateral Lower Leg o Three times weekly Follow-up Appointments Wound #9 Left,Lateral Lower Leg o Return Appointment in 1 week. Edema Control Wound #9 Left,Lateral Lower Leg o Kerlix and Coban - Left Lower Extremity - do not wrap too tight o Elevate legs to the level of the heart and pump ankles as often as possible Home Health Wound #9 Left,Lateral Lower Leg o [place] Visits - [place] Nurse may visit PRN to address  patientos wound care needs. o FACE TO FACE ENCOUNTER: MEDICARE and MEDICAID PATIENTS: I certify that this patient is under my care and that I had a face-to-face encounter that meets the physician face-to-face LIANE, CARUCCI ([id number]) encounter requirements with this patient on this date. The encounter with the patient was in whole or in part for the following MEDICAL CONDITION: (primary reason for [place]) MEDICAL NECESSITY: I certify, that based on my findings, NURSING services are a medically necessary home health service. HOME BOUND STATUS: I certify that my clinical findings support that this patient is homebound (i.e., Due to illness or injury, pt requires aid of supportive devices such as crutches, cane, wheelchairs, walkers, the use of special transportation or the assistance of another person to leave their place of residence. There is a normal inability to leave the home and doing so requires considerable and taxing effort. Other absences are for medical reasons / religious services and are infrequent or of short duration when for other reasons). o If current dressing causes regression in wound condition, may D/C ordered dressing product/s and apply Normal Saline Moist Dressing daily until next [place] / Other MD appointment. [place] of regression in wound condition at [phone]. o Please direct any NON-WOUND related issues/requests for orders to patient's Primary Care Physician Electronic Signature(s) Signed: 06/18/2016 4:45:11 PM By: Regan Lemming BSN, RN Signed: 06/19/2016 9:58:44 AM By: Linton Ham MD Entered By: Regan Lemming on 06/18/2016 16:27:00 [name] ([id number]) -------------------------------------------------------------------------------- Problem List Details Patient Name: [name] Date of Service: 06/18/2016 3:30 PM Medical Record Patient Account Number: [id number] Number: Treating RN:  Baruch Gouty, RN, BSN, Rita Sep 02, 1950 (66 y.o. 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 11/20/2015 Yes exposed L97.512 Non-pressure chronic ulcer of other part of right foot with 11/20/2015 Yes fat layer exposed G35 Multiple sclerosis 11/20/2015 Yes E66.01 Morbid (severe) obesity due to excess calories 11/20/2015 Yes F17.218 Nicotine dependence, cigarettes, with other nicotine- 11/20/2015 Yes induced disorders L04.3 Acute lymphadenitis of lower limb 04/03/2016 Yes Inactive Problems ICD-10 Code Description Active Date Inactive Date L97.222 Non-pressure chronic  ulcer of left calf with fat layer 11/20/2015 11/20/2015 exposed Resolved Problems KOURTNI, GREGOR ([id number]) Electronic Signature(s) Signed: 06/19/2016 9:58:44 AM By: Linton Ham MD Entered By: Linton Ham on 06/19/2016 08:02:29 KITANA, GILANI ([id number]) -------------------------------------------------------------------------------- Progress Note Details Patient Name: [name] Date of Service: 06/18/2016 3:30 PM Medical Record Patient Account Number: [id number] Number: Treating RN: Baruch Gouty, RN, BSN, Rita 1950-09-20 (66 y.o. Other Clinician: Date of Birth/Sex: Female) Treating Yvana Samonte Primary Care Provider: LADA, MELINDA Provider/Extender: G Referring Provider: LADA, MELINDA Weeks in Treatment: 30 Subjective Chief Complaint Information obtained from Patient returns for evaluation of bilateral lower extremity ulcerations History of Present Illness (HPI) The following HPI elements were documented for the patient's wound: Location: left and right lower extremity and dorsum of the right foot Quality: Patient reports  experiencing a sharp pain to affected area(s). Severity: Patient rates the pain to be a 8 out of 10 especially with palpation Duration: Patient has had the wound for > 6 months prior to seeking treatment at the wound center Timing: Pain in wound is Intermittent in severity but persistent Context: The wound appeared gradually over time Modifying Factors: Other treatment(s) tried include: symptomatic treatment as advised by her PCP Associated Signs and Symptoms: Patient reports having increase swelling iin her bilateral lower extremities 66 year old with a past medical history significant for MS, urinary incontinence, and obesity. She has been seen in the wound clinic before for lower extremity ulcerations treated with compression therapy. she is also known to have hypertension, peripheral vascular disease, COPD, obstructive sleep apnea, lumbar radiculopathy, kyphoscoliosis, urinary issues and tobacco abuse. Smokes a packet of cigarettes a day was recently seen at the [facility] for swelling of her legs and feet with a ulceration on the dorsum of the right foot which has been there for about 6 months. she was recently in the ER about a month ago where she was seen for shortness of breath and swelling of the legs and a chest x-ray was within normal limits had an increase in her BNP and was given Lasix and put on doxycycline for a mild cellulitis and possible UTI. Wounds aresmaller today 11/13/15. Debrided and will continue Santyl. 12/21/2015 -- she was admitted to the hospital overnight on 12/18/2015 and diagnosed with urinary retention and cellulitis of the left lower leg. is past to take clindamycin and use Santyl for her wounds. 01/15/2016 -- come back to see [place] for almost a month and continues to be noncompliant with her dressings 01/30/16 patient presents today for a follow-up visit concerning her ongoing bilateral lower extremity wounds. We have not seen her for the past 2 weeks  although we are supposed to be seen her for weekly visits. She currently has a Foley catheter. She tells me that the wounds are intensely painful especially with pressure and palpation at this point in time. Fortunately she is having no interval signs or symptoms of systemic infection but unfortunately the wounds have not improved dramatically since we last saw her. She does have home health coming to take care of her as well. She is currently not in any compression wraps. DYAMOND, PONGRACZ ([id number]) 02/06/16 ON evaluation today patient continues to experience discomfort regarding her bilateral lower extremity ulcers. She has continued to tolerate the dressing changes at this point in time and continues to have a Foley catheter as well. Fortunately she is back this week in the past she has been somewhat noncompliant with follow-ups I'm glad to see her today.  She tells me that her pain level varies but can be as high as a 7 out of 10 with manipulation of the wound. She tells me that she used to be on oxycodone which was managed by the pain clinic although she is no longer on that as she tells me that she was actually smoking marijuana at the time and when this was found out they discontinued her pain medication. She no longer is taking anything pain medication wise and she also does not smoke cigarettes nor marijuana at this point. 02/12/16; this is a patient I don't believe I have previously seen. She has multiple sclerosis. She has 4 punched-out areas on the anterior lateral left leg 2 areas on the right these are all in the same condition. Reasonably small [dime size] wounds each was some depth. Surface of these wounds does not look particularly healthy as there is adherent slough. There is no evidence of surrounding infection or inflammation. ABIs in this clinic were 0.87 on the right and 0.81 on the left. She is listed as having PAD and is a smoker. Not a diabetic 02/20/16 patient I gave  antibiotics to last week for erythema around both wound sites on the left lateral leg and right lateral leg. This appears to be a lot better. One of the areas on the left leg is healed however she still has 3 punched-out open areas on the left lateral calf and one on the right lateral calf and one on the dorsal foot. She is an ex-smoker quitting 1 month ago. ABIs in this clinic were 0.87 on the right 0.81 on the left 03/12/16; this is a patient I really don't have a good sense of. It would appear for the first 5 or 6 weeks of her stay in this clinic she was cared for by Dr. Con Memos. She appeared on my schedule in mid October and I saw her twice. She has not been here however in 3 weeks. She has advanced Wound Care at home where she lives with her husband. She has multiple sclerosis. I saw her the first time she had 4 punched-out small wounds on the anterior lateral left leg it appears that she is now down to 2. She also had wounds on the lateral and medial aspect of her right leg which were also small and punched out. The only one that remains is on the right lateral. Because of the nature of her wound I went ahead and ordered formal arterial studies this showed an ABI of 1 on the right and one on the left. TBI of 1.4 on the right and 0.79 on the left. She had normal waveforms. She was in the emergency room on 02/28/16; there is a noted edema of her left leg after a fall. She had a duplex ultrasound that showed no evidence for an acute DVT from the left groin to the popliteal fossa. The study was limited in the calf veins due to edema. Also noticeable that she had a left inguinal enlarged lymph node up to 6.2 cm. She also had a left knee x-ray that showed no acute findings and a right foot x-ray that showed soft tissue swelling but no radiographic evidence of osteomyelitis. The patient does stop smoking 03-19-16 Ms Delao presents today for evaluation of her bilateral lower extremity ulcerations. She  states that she has not smoked in several weeks. She denies any pain or discomfort to bilateral lower extremities, tolerating compression therapy as ordered. 03/26/16; I have not seen  this patient in 2 weeks however she has done very well with improvements in the areas in question. After we obtain normal arterial studies, increasing the 4-layer compression really seems to look done a nice job here. She has one open area on the left lateral leg and one on the medial right leg. These have filled in nicely and are now superficial wounds that showed epithelialized. I note the left inguinal lymph node at 6.2 cm. I may need to refer this back to the patient's primary doctor. 04/03/16; patient has 2 remaining wounds 1 on the left lateral leg and one on the right medial leg. Both of filled in nicely since we are able to increase her from a 3 layer to a 4 layer compression. I am also following up on the lymph node notable on her left leg DVT rule out in November 04/10/16; area on the right lateral leg is healed. The area on the left leg is still open but looks considerably better with healthy granulation and less wound area. I12/20/17; last week we healed the patient doubt with regards to the wound on the right leg to her on compression stocking 20-30 mmHg as it turns out I don't think she had a stocking, predictably therefore she Holster, Mirra H. ([id number]) has reopened. The left leg as well as the wound on the lateral aspect. Been generally small line 04/24/16; we healed out her right leg 2 weeks ago to her own 20-30 mm compression stockings although as it turns out she didn't have these and did not purchase some apparently because of financial issues. The left leg wound is on the lateral aspect. Both of these wounds are just about healed. 05/01/16; her wounds on the left leg are healed out today. The area on the right leg is also healed. Vitamin diligent effort of our staff we have not been able to get  any form of compression stocking to this patient. The orders for juxtalite's were given to home health this never materialized. We ordered compression stockings I don't think she was able to afford these. We had a discussion today with both the patient and her husband without these, will accumulate edema and the wounds will simply reopen again. 05/14/16 READMISSION this is a patient we healed out 2 weeks ago. As bilateral lower extremity venous wounds probably some degree of lymphedema. At the beginning in November I did a duplex ultrasound of her left leg that was negative for DVT but did show a lymph node in the left inguinal area presumably reactive. We've been using compression to her lower extremities eventually healed all her wounds on her bilateral calves but we were not able to get any form of compression stockings for her in spite of intense efforts of our staff. She returns today with reopening on the left leg did not the right she has several superficial areas anteriorly but an actual frank ulcer on the lateral left calf. This is about the size of a dime or smaller. She does not really complain of pain. The patient has a history of PAD however arterial studies we did in November were normal. Her ABIs and Doppler waveforms were both within the normal limits. The patient has a history of MS. In discussing things with her today it would appear that the opening was noted 2 weeks ago according to the patient. We gave her Tubigrip stockings when she left the clinic 05/21/16; she has not yet had the duplex ultrasound of her thigh, this is  booked for Thursday. The wound on the left lateral lower leg is improved 05/28/16; her duplex ultrasound of the left leg specifically her left thigh did not show a DVT however did show hypoechogenic enlarged left inguinal lymph nodes up to 6.9 x 3.7 x 6.8. This also showed in the one in November however maximal diameter was 6.2, at that point I thought these  were reactive secondary to cellulitis however there is obviously a differential here. Her wound on the left lower leg is closed. She has a superficial open area on the base of her left fifth metatarsal head on the plantar foot. It looks as though she has some wrap injuries on the anterior leg 06/05/16; since she was last year she had a fall on 1/31. She was seen in the ER but sent back home. I think she is also been seen by her primary physician who picked up on the swelling and the lymphadenopathy. She is ordered a CT scan of the abdomen and pelvis however this will be without contrast because of stage IIIB renal insufficiency. Nevertheless this should be a start the workup to make sure there is not is more systemic problem here. She will probably need consideration of a biopsy of the lymph node in her inguinal area, this would need general surgery. As far as her wounds are concern today the area on the left plantar foot is healed. She has a small weeping area on the left lower leg. The wrap injury from last week has largely Pardoxically there is a lot less edema in the left thigh. 06/12/16; CT scan is on Friday. She has 2 small open areas one on the lateral left lower leg and one on the anterior lower leg on the left. Both of these looks healthy. I reduced her compression last week to Kerlix and Coban this seems to have not a reasonable job Objective Zakrzewski, Dominiqua H. ([id number]) Constitutional Sitting or standing Blood Pressure is within target range for patient.. Pulse regular and within target range for patient.Marland Kitchen Respirations regular, non-labored and within target range.. Temperature is normal and within the target range for the patient.. Patient looks somewhat tired and gaunt. Vitals Time Taken: 4:04 PM, Height: 63 in, Weight: 270 lbs, BMI: 47.8, Temperature: 98.9 F, Pulse: 67 bpm, Respiratory Rate: 18 breaths/min, Blood Pressure: 138/90 mmHg. Respiratory Respiratory effort is easy and  symmetric bilaterally. Rate is normal at rest and on room air.. Cardiovascular Even through the swelling I think I can feel her pedal pulses. Left lower leg swelling appears stable with just 2 layer compression. Her upper leg still has left greater than right edema which is probably lymphedema. Lymphatic Nonpalpable in the left popliteal or inguinal area. Psychiatric Patient appears depressed today.. General Notes: Wound exam; the patient has 1 remaining wound which appears to be healthy on the left lateral lower leg. There is no evidence of infection. The one on her anterior leg is closed Integumentary (Hair, Skin) Wound #11 status is Healed - Epithelialized. Original cause of wound was Pressure Injury. The wound is located on the Left,Anterior Lower Leg. The wound measures 0cm length x 0cm width x 0cm depth; 0cm^2 area and 0cm^3 volume. The wound is limited to skin breakdown. There is no tunneling or undermining noted. There is a none present amount of drainage noted. The wound margin is flat and intact. There is large (67-100%) friable granulation within the wound bed. There is no necrotic tissue within the wound bed. The periwound skin appearance did  not exhibit: Callus, Crepitus, Excoriation, Induration, Rash, Scarring, Dry/Scaly, Maceration, Atrophie Blanche, Cyanosis, Ecchymosis, Hemosiderin Staining, Mottled, Pallor, Rubor, Erythema. Periwound temperature was noted as No Abnormality. Wound #9 status is Open. Original cause of wound was Shear/Friction. The wound is located on the Left,Lateral Lower Leg. The wound measures 1cm length x 0.5cm width x 0.1cm depth; 0.393cm^2 area and 0.039cm^3 volume. The wound is limited to skin breakdown. There is no tunneling or undermining noted. There is a small amount of serosanguineous drainage noted. The wound margin is flat and intact. There is large (67-100%) pink, pale granulation within the wound bed. There is no necrotic tissue within  the wound bed. The periwound skin appearance did not exhibit: Callus, Crepitus, Excoriation, Induration, Rash, Scarring, Dry/Scaly, Maceration, Atrophie Blanche, Cyanosis, Ecchymosis, Hemosiderin Staining, Mottled, Pallor, Rubor, Erythema. Periwound temperature was noted as No Abnormality. The periwound has tenderness on palpation. Assessment KRUTI, GRUENHAGEN ([id number]) Active Problems ICD-10 [phone] - Non-pressure chronic ulcer of right calf with fat layer exposed L97.512 - Non-pressure chronic ulcer of other part of right foot with fat layer exposed G35 - Multiple sclerosis E66.01 - Morbid (severe) obesity due to excess calories F17.218 - Nicotine dependence, cigarettes, with other nicotine-induced disorders L04.3 - Acute lymphadenitis of lower limb Plan Wound Cleansing: Wound #9 Left,Lateral Lower Leg: Clean wound with Normal Saline. Cleanse wound with mild soap and water No tub bath. Primary Wound Dressing: Wound #9 Left,Lateral Lower Leg: Aquacel Ag Secondary Dressing: Wound #9 Left,Lateral Lower Leg: ABD pad Dressing Change Frequency: Wound #9 Left,Lateral Lower Leg: Three times weekly Follow-up Appointments: Wound #9 Left,Lateral Lower Leg: Return Appointment in 1 week. Edema Control: Wound #9 Left,Lateral Lower Leg: Kerlix and Coban - Left Lower Extremity - do not wrap too tight Elevate legs to the level of the heart and pump ankles as often as possible Home Health: Wound #9 Left,Lateral Lower Leg: Continue Home Health Visits - [place] Nurse may visit PRN to address patient s wound care needs. FACE TO FACE ENCOUNTER: MEDICARE and MEDICAID PATIENTS: I certify that this patient is under my care and that I had a face-to-face encounter that meets the physician face-to-face encounter requirements with this patient on this date. The encounter with the patient was in whole or in part for the following MEDICAL CONDITION: (primary reason for [place])  MEDICAL NECESSITY: I certify, that based on my findings, NURSING services are a medically necessary home health service. HOME BOUND STATUS: I certify that my clinical findings support that this patient is homebound (i.e., Due to illness or injury, pt requires aid of supportive devices such as crutches, cane, wheelchairs, walkers, the use Guedea, Deundra H. ([id number]) of special transportation or the assistance of another person to leave their place of residence. There is a normal inability to leave the home and doing so requires considerable and taxing effort. Other absences are for medical reasons / religious services and are infrequent or of short duration when for other reasons). If current dressing causes regression in wound condition, may D/C ordered dressing product/s and apply Normal Saline Moist Dressing daily until next [place] / Other MD appointment. [place] of regression in wound condition at [phone]. Please direct any NON-WOUND related issues/requests for orders to patient's Primary Care Physician o o #1 we continue with Aquacel Ag into layer compression. This wound should heal #2 this CT scan of the abdomen she had showed lymphadenopathy within the left inguinal region and the left iliac chain.  Highly suspicious for underlying neoplasm. #3 the patient's primary physician is apparently already arranged a biopsy. She also has a lymphomatous mass within the left hemipelvis. No involvement of the liver. The periaortic lymph nodes were not significantly enlarged. There was no evidence of an ovarian problem. Electronic Signature(s) Signed: 06/19/2016 9:58:44 AM By: Linton Ham MD Entered By: Linton Ham on 06/19/2016 08:07:08 [name] ([id number]) -------------------------------------------------------------------------------- SuperBill Details Patient Name: [name] Date of Service: 06/18/2016 Medical Record Patient Account  Number: [id number] Number: Treating RN: Baruch Gouty, RN, BSN, Rita April 22, 1951 (66 y.o. Other Clinician: Date of Birth/Sex: Female) Treating Kaz Auld, Lafe Primary Care Provider: LADA, [place] Provider/Extender: G Referring Provider: LADA, Sharon Service Line: Outpatient Weeks in Treatment: 30 Diagnosis Coding ICD-10 Codes Code Description [phone] Non-pressure chronic ulcer of right calf with fat layer exposed L97.512 Non-pressure chronic ulcer of other part of right foot with fat layer exposed G35 Multiple sclerosis E66.01 Morbid (severe) obesity due to excess calories F17.218 Nicotine dependence, cigarettes, with other nicotine-induced disorders L04.3 Acute lymphadenitis of lower limb L97.221 Non-pressure chronic ulcer of left calf limited to breakdown of skin Facility Procedures CPT4 Code: [id number] Description: 99213 - WOUND CARE VISIT-LEV 3 EST PT Modifier: Quantity: 1 Physician Procedures CPT4 Code Description: [id number] - WC PHYS LEVEL 3 - EST PT ICD-10 Description Diagnosis L97.221 Non-pressure chronic ulcer of left calf limited to Modifier: breakdown of ski Quantity: 1 n Electronic Signature(s) Signed: 06/19/2016 9:58:44 AM By: Linton Ham MD Entered By: Linton Ham on 06/19/2016 [id number]

## 2016-06-19 NOTE — Progress Notes (Addendum)
SKYLUR, ZOPPI ([id number]) Visit Report for 06/18/2016 Arrival Information Details Patient Name: [name], [name] Date of Service: 06/18/2016 3:30 PM Medical Record Number: [id number] Patient Account Number: [id number] Date of Birth/Sex: 09/11/1950 (66 y.o. Female) Treating RN: Afful, RN, BSN, Velva Harman Primary Care Jamal Pavon: LADA, [place] Other Clinician: Referring Ahria Slappey: LADA, [place] Treating Uchenna Seufert/Extender: Tito Dine in Treatment: 54 Visit Information History Since Last Visit All ordered tests and consults were completed: No Patient Arrived: Wheel Chair Added or deleted any medications: No Arrival Time: 15:59 Any new allergies or adverse reactions: No Accompanied By: husband in lobby Had a fall or experienced change in No activities of daily living that may affect Transfer Assistance: None risk of falls: Patient Identification Verified: Yes Signs or symptoms of abuse/neglect since last No Secondary Verification Process Yes visito Completed: Hospitalized since last visit: No Patient Requires Transmission- No Has Dressing in Place as Prescribed: Yes Based Precautions: Has Compression in Place as Prescribed: Yes Patient Has Alerts: Yes Pain Present Now: No Electronic Signature(s) Signed: 06/18/2016 4:45:11 PM By: Regan Lemming BSN, RN Entered By: Regan Lemming on 06/18/2016 16:02:23 [name] -------------------------------------------------------------------------------- Clinic Level of Care Assessment Details Patient Name: [name] Date of Service: 06/18/2016 3:30 PM Medical Record Number: [id number] Patient Account Number: [id number] Date of Birth/Sex: 04-Feb-1951 (66 y.o. Female) Treating RN: Afful, RN, BSN, [employer] Primary Care Cavon Nicolls: LADA, [place] Other Clinician: Referring Kenyata Guess: LADA, [place] Treating Reba Hulett/Extender: Tito Dine in Treatment: 30 Clinic Level of Care Assessment Items TOOL 4 Quantity Score []  -  Use when only an EandM is performed on FOLLOW-UP visit 0 ASSESSMENTS - Nursing Assessment / Reassessment X - Reassessment of Co-morbidities (includes updates in patient status) 1 10 X - Reassessment of Adherence to Treatment Plan 1 5 ASSESSMENTS - Wound and Skin Assessment / Reassessment X - Simple Wound Assessment / Reassessment - one wound 1 5 []  - Complex Wound Assessment / Reassessment - multiple wounds 0 []  - Dermatologic / Skin Assessment (not related to wound area) 0 ASSESSMENTS - Focused Assessment []  - Circumferential Edema Measurements - multi extremities 0 []  - Nutritional Assessment / Counseling / Intervention 0 X - Lower Extremity Assessment (monofilament, tuning fork, pulses) 1 5 []  - Peripheral Arterial Disease Assessment (using hand held doppler) 0 ASSESSMENTS - Ostomy and/or Continence Assessment and Care []  - Incontinence Assessment and Management 0 []  - Ostomy Care Assessment and Management (repouching, etc.) 0 PROCESS - Coordination of Care X - Simple Patient / Family Education for ongoing care 1 15 []  - Complex (extensive) Patient / Family Education for ongoing care 0 X - Staff obtains Programmer, systems, Records, Test Results / Process Orders 1 10 []  - Staff telephones HHA, Nursing Homes / Clarify orders / etc 0 []  - Routine Transfer to another Facility (non-emergent condition) 0 [name], [name] H. ([id number]) []  - Routine Hospital Admission (non-emergent condition) 0 []  - New Admissions / Biomedical engineer / Ordering NPWT, Apligraf, etc. 0 []  - Emergency Hospital Admission (emergent condition) 0 []  - Simple Discharge Coordination 0 []  - Complex (extensive) Discharge Coordination 0 PROCESS - Special Needs []  - Pediatric / Minor Patient Management 0 []  - Isolation Patient Management 0 []  - Hearing / Language / Visual special needs 0 []  - Assessment of Community assistance (transportation, D/C planning, etc.) 0 []  - Additional assistance / Altered mentation 0 []  -  Support Surface(s) Assessment (bed, cushion, seat, etc.) 0 INTERVENTIONS - Wound Cleansing / Measurement X - Simple Wound Cleansing -  one wound 1 5 []  - Complex Wound Cleansing - multiple wounds 0 X - Wound Imaging (photographs - any number of wounds) 1 5 []  - Wound Tracing (instead of photographs) 0 X - Simple Wound Measurement - one wound 1 5 []  - Complex Wound Measurement - multiple wounds 0 INTERVENTIONS - Wound Dressings X - Small Wound Dressing one or multiple wounds 1 10 []  - Medium Wound Dressing one or multiple wounds 0 []  - Large Wound Dressing one or multiple wounds 0 []  - Application of Medications - topical 0 []  - Application of Medications - injection 0 INTERVENTIONS - Miscellaneous []  - External ear exam 0 [name], [name] H. ([id number]) []  - Specimen Collection (cultures, biopsies, blood, body fluids, etc.) 0 []  - Specimen(s) / Culture(s) sent or taken to Lab for analysis 0 []  - Patient Transfer (multiple staff / Harrel Lemon Lift / Similar devices) 0 []  - Simple Staple / Suture removal (25 or less) 0 []  - Complex Staple / Suture removal (26 or more) 0 []  - Hypo / Hyperglycemic Management (close monitor of Blood Glucose) 0 []  - Ankle / Brachial Index (ABI) - do not check if billed separately 0 X - Vital Signs 1 5 Has the patient been seen at the hospital within the last three years: Yes Total Score: 80 Level Of Care: New/Established - Level 3 Electronic Signature(s) Signed: 06/18/2016 4:45:11 PM By: Regan Lemming BSN, RN Entered By: Regan Lemming on 06/18/2016 16:27:54 [name] ([id number]) -------------------------------------------------------------------------------- Encounter Discharge Information Details Patient Name: [name] Date of Service: 06/18/2016 3:30 PM Medical Record Number: [id number] Patient Account Number: [id number] Date of Birth/Sex: 1950/05/31 (65 y.o. Female) Treating RN: Afful, RN, BSN, Velva Harman Primary Care Kayle Passarelli: LADA, [facility] Other  Clinician: Referring Lasha Echeverria: LADA, [place] Treating Sherice Ijames/Extender: Tito Dine in Treatment: 30 Encounter Discharge Information Items Discharge Pain Level: 0 Discharge Condition: Stable Ambulatory Status: Wheelchair Discharge Destination: Home Transportation: Private Auto Accompanied By: husband Schedule Follow-up Appointment: No Medication Reconciliation completed and provided to Patient/Care No Oleva Koo: Provided on Clinical Summary of Care: 06/18/2016 Form Type Recipient Paper Patient BW Electronic Signature(s) Signed: 06/18/2016 4:45:11 PM By: Regan Lemming BSN, RN Previous Signature: 06/18/2016 4:28:24 PM Version By: Ruthine Dose Entered By: Regan Lemming on 06/18/2016 16:28:53 [name] ([id number]) -------------------------------------------------------------------------------- Lower Extremity Assessment Details Patient Name: [name] Date of Service: 06/18/2016 3:30 PM Medical Record Number: [id number] Patient Account Number: [id number] Date of Birth/Sex: 1950-10-23 (65 y.o. Female) Treating RN: Afful, RN, BSN, [place] Primary Care Lainie Daubert: LADA, [place] Other Clinician: Referring Denni France: LADA, [place] Treating Percilla Tweten/Extender: Tito Dine in Treatment: 30 Edema Assessment Assessed: [Left: No] [Right: No] Edema: [Left: Ye] [Right: s] Calf Left: Right: Point of Measurement: 33 cm From Medial Instep 38.4 cm cm Ankle Left: Right: Point of Measurement: 11 cm From Medial Instep 21.4 cm cm Vascular Assessment Claudication: Claudication Assessment [Left:None] Pulses: Dorsalis Pedis Palpable: [Left:Yes] Posterior Tibial Extremity colors, hair growth, and conditions: Extremity Color: [Left:Mottled] Hair Growth on Extremity: [Left:No] Temperature of Extremity: [Left:Warm] Capillary Refill: [Left:< 3 seconds] Toe Nail Assessment Left: Right: Thick: Yes Discolored: Yes Deformed: No Improper Length and Hygiene:  Yes Electronic Signature(s) Signed: 06/18/2016 4:45:11 PM By: Regan Lemming BSN, RN Entered By: Regan Lemming on 06/18/2016 16:03:23 [name], [name] ([id number]) -------------------------------------------------------------------------------- Multi Wound Chart Details Patient Name: [name] Date of Service: 06/18/2016 3:30 PM Medical Record Number: [id number] Patient Account Number: [id number] Date of Birth/Sex: 09-13-1950 (66 y.o. Female) Treating  RN: Baruch Gouty, RN, BSN, [employer] Primary Care Massiah Longanecker: LADA, [place] Other Clinician: Referring Esmond Hinch: LADA, [place] Treating Meri Pelot/Extender: Ricard Dillon Weeks in Treatment: 30 Vital Signs Height(in): 63 Pulse(bpm): 67 Weight(lbs): 270 Blood Pressure 138/90 (mmHg): Body Mass Index(BMI): 48 Temperature(F): 98.9 Respiratory Rate 18 (breaths/min): Photos: [N/A:N/A] Wound Location: Left Lower Leg - Anterior Left Lower Leg - Lateral N/A Wounding Event: Pressure Injury Shear/Friction N/A Primary Etiology: Pressure Ulcer Lymphedema N/A Comorbid History: Cataracts, Chronic Cataracts, Chronic N/A Obstructive Pulmonary Obstructive Pulmonary Disease (COPD), Sleep Disease (COPD), Sleep Apnea, Hypertension, Apnea, Hypertension, Osteoarthritis, Neuropathy Osteoarthritis, Neuropathy Date Acquired: 06/04/2016 05/09/2016 N/A Weeks of Treatment: 1 5 N/A Wound Status: Healed - Epithelialized Open N/A Measurements L x W x D 0x0x0 1x0.5x0.1 N/A (cm) Area (cm) : 0 0.393 N/A Volume (cm) : 0 0.039 N/A % Reduction in Area: 100.00% 49.90% N/A % Reduction in Volume: 100.00% 50.60% N/A [name], [name] H. ([id number]) Classification: Category/Stage II Partial Thickness N/A Exudate Amount: None Present Small N/A Exudate Type: N/A Serosanguineous N/A Exudate Color: N/A red, brown N/A Wound Margin: Flat and Intact Flat and Intact N/A Granulation Amount: Large (67-100%) Large (67-100%) N/A Granulation Quality: Friable  Pink, Pale N/A Necrotic Amount: None Present (0%) None Present (0%) N/A Exposed Structures: Fascia: No Fascia: No N/A Fat Layer (Subcutaneous Fat Layer (Subcutaneous Tissue) Exposed: No Tissue) Exposed: No Tendon: No Tendon: No Muscle: No Muscle: No Joint: No Joint: No Bone: No Bone: No Limited to Skin Limited to Skin Breakdown Breakdown Epithelialization: Large (67-100%) None N/A Periwound Skin Texture: Excoriation: No Excoriation: No N/A Induration: No Induration: No Callus: No Callus: No Crepitus: No Crepitus: No Rash: No Rash: No Scarring: No Scarring: No Periwound Skin Maceration: No Maceration: No N/A Moisture: Dry/Scaly: No Dry/Scaly: No Periwound Skin Color: Atrophie Blanche: No Atrophie Blanche: No N/A Cyanosis: No Cyanosis: No Ecchymosis: No Ecchymosis: No Erythema: No Erythema: No Hemosiderin Staining: No Hemosiderin Staining: No Mottled: No Mottled: No Pallor: No Pallor: No Rubor: No Rubor: No Temperature: No Abnormality No Abnormality N/A Tenderness on No Yes N/A Palpation: Wound Preparation: Ulcer Cleansing: Other: Ulcer Cleansing: Other: N/A surg scrub and water surg scrub and water Topical Anesthetic Topical Anesthetic Applied: None Applied: None Treatment Notes Wound #9 (Left, Lateral Lower Leg) 1. Cleansed with: Cleanse wound with antibacterial soap and water 4. Dressing Applied: Aquacel Ag [name], [name] H. ([id number]) 5. Secondary Dressing Applied ABD Pad 7. Secured with Tape Other (specify in notes) Notes kerlix and coban from toes to 3cm below the knee Electronic Signature(s) Signed: 06/19/2016 9:58:44 AM By: Linton Ham MD Previous Signature: 06/18/2016 4:45:11 PM Version By: Regan Lemming BSN, RN Entered By: Linton Ham on 06/19/2016 08:02:40 [name] ([id number]) -------------------------------------------------------------------------------- Multi-Disciplinary Care Plan Details Patient Name: [name] Date of Service: 06/18/2016 3:30 PM Medical Record Number: [id number] Patient Account Number: [id number] Date of Birth/Sex: 1950/10/11 (66 y.o. Female) Treating RN: Afful, RN, BSN, Velva Harman Primary Care Cira Deyoe: LADA, [facility] Other Clinician: Referring Shaya Altamura: LADA, [place] Treating Tolulope Pinkett/Extender: Tito Dine in Treatment: 30 Active Inactive ` Abuse / Safety / Falls / Self Care Management Nursing Diagnoses: Impaired physical mobility Potential for falls Goals: Patient will remain injury free Date Initiated: 11/20/2015 Target Resolution Date: 05/28/2016 Goal Status: Active Interventions: Assess fall risk on admission and as needed Notes: ` Nutrition Nursing Diagnoses: Imbalanced nutrition Goals: Patient/caregiver agrees to and verbalizes understanding of need to use nutritional supplements and/or vitamins as prescribed Date Initiated: 11/20/2015 Target Resolution Date: 05/28/2016 Goal Status: Active Interventions: Provide  education on nutrition Treatment Activities: Education provided on Nutrition : 03/19/2016 Notes: [name], [name] Orientation to the Wound Care Program Nursing Diagnoses: Knowledge deficit related to the wound healing center program Goals: Patient/caregiver will verbalize understanding of the [place] Date Initiated: 11/20/2015 Target Resolution Date: 05/28/2016 Goal Status: Active Interventions: Provide education on orientation to the wound center Notes: ` Wound/Skin Impairment Nursing Diagnoses: Impaired tissue integrity Goals: Patient will demonstrate a reduced rate of smoking or cessation of smoking Date Initiated: 11/20/2015 Target Resolution Date: 06/18/2016 Goal Status: Active Ulcer/skin breakdown will heal within 14 weeks Date Initiated: 11/20/2015 Target Resolution Date: 07/16/2016 Goal Status: Active Interventions: Assess patient/caregiver ability to obtain necessary supplies Provide  education on smoking Notes: Electronic Signature(s) Signed: 06/18/2016 4:45:11 PM By: Regan Lemming BSN, RN Entered By: Regan Lemming on 06/18/2016 16:12:49 [name] -------------------------------------------------------------------------------- Pain Assessment Details Patient Name: [name] Date of Service: 06/18/2016 3:30 PM Medical Record Number: [id number] Patient Account Number: [id number] Date of Birth/Sex: 03-Jul-1950 (66 y.o. Female) Treating RN: Afful, RN, BSN, Velva Harman Primary Care Lorris Carducci: LADA, [place] Other Clinician: Referring Treg Diemer: LADA, [place] Treating Lyell Clugston/Extender: Tito Dine in Treatment: 30 Active Problems Location of Pain Severity and Description of Pain Patient Has Paino No Site Locations With Dressing Change: No Pain Management and Medication Current Pain Management: Electronic Signature(s) Signed: 06/18/2016 4:45:11 PM By: Regan Lemming BSN, RN Entered By: Regan Lemming on 06/18/2016 16:02:30 [name] -------------------------------------------------------------------------------- Patient/Caregiver Education Details Patient Name: [name] Date of Service: 06/18/2016 3:30 PM Medical Record Patient Account Number: [id number] Number: Treating RN: Baruch Gouty, RN, BSN, [place] 1950-05-13 (66 y.o. Other Clinician: Date of Birth/Gender: Female) Treating ROBSON, [place] Primary Care Physician: LADA, MELINDA Physician/Extender: G Referring Physician: LADA, MELINDA Weeks in Treatment: 30 Education Assessment Education Provided To: Patient Education Topics Provided Nutrition: Methods: Explain/Verbal Responses: State content correctly Smoking and Wound Healing: Methods: Explain/Verbal Responses: State content correctly Welcome To [facility] [place]: Methods: Explain/Verbal Responses: State content correctly Wound/Skin Impairment: Methods: Explain/Verbal Responses: State content  correctly Electronic Signature(s) Signed: 06/18/2016 4:45:11 PM By: Regan Lemming BSN, RN Entered By: Regan Lemming on 06/18/2016 16:29:12 [name] -------------------------------------------------------------------------------- Wound Assessment Details Patient Name: [name] Date of Service: 06/18/2016 3:30 PM Medical Record Number: [id number] Patient Account Number: [id number] Date of Birth/Sex: 15-Jun-1950 (65 y.o. Female) Treating RN: Afful, RN, BSN, [employer] Primary Care Jaanai Salemi: LADA, [place] Other Clinician: Referring Gionni Freese: LADA, [place] Treating Elianny Buxbaum/Extender: Ricard Dillon Weeks in Treatment: 30 Wound Status Wound Number: 11 Primary Pressure Ulcer Etiology: Wound Location: Left Lower Leg - Anterior Wound Healed - Epithelialized Wounding Event: Pressure Injury Status: Date Acquired: 06/04/2016 Comorbid Cataracts, Chronic Obstructive Weeks Of Treatment: 1 History: Pulmonary Disease (COPD), Sleep Clustered Wound: No Apnea, Hypertension, Osteoarthritis, Neuropathy Photos Photo Uploaded By: Regan Lemming on 06/18/2016 17:02:57 Wound Measurements Length: (cm) 0 % Reduction i Width: (cm) 0 % Reduction i Depth: (cm) 0 Epithelializa Area: (cm) 0 Tunneling: Volume: (cm) 0 Undermining: n Area: 100% n Volume: 100% tion: Large (67-100%) No No Wound Description Classification: Category/Stage II Wound Margin: Flat and Intact Exudate Amount: None Present Foul Odor After Cleansing: No Slough/Fibrino No Wound Bed Granulation Amount: Large (67-100%) Exposed Structure Granulation Quality: Friable Fascia Exposed: No Necrotic Amount: None Present (0%) Fat Layer (Subcutaneous Tissue) Exposed: No Tendon Exposed: No Muscle Exposed: No [name], [name] H. ([id number]) Joint Exposed: No Bone Exposed: No Limited to Skin Breakdown Periwound Skin Texture Texture Color  No Abnormalities Noted: No No Abnormalities Noted: No Callus: No Atrophie Blanche:  No Crepitus: No Cyanosis: No Excoriation: No Ecchymosis: No Induration: No Erythema: No Rash: No Hemosiderin Staining: No Scarring: No Mottled: No Pallor: No Moisture Rubor: No No Abnormalities Noted: No Dry / Scaly: No Temperature / Pain Maceration: No Temperature: No Abnormality Wound Preparation Ulcer Cleansing: Other: surg scrub and water, Topical Anesthetic Applied: None Electronic Signature(s) Signed: 06/18/2016 4:45:11 PM By: Regan Lemming BSN, RN Entered By: Regan Lemming on 06/18/2016 16:11:54 [name] ([id number]) -------------------------------------------------------------------------------- Wound Assessment Details Patient Name: [name] Date of Service: 06/18/2016 3:30 PM Medical Record Number: [id number] Patient Account Number: [id number] Date of Birth/Sex: 1950/12/16 (65 y.o. Female) Treating RN: Afful, RN, BSN, [employer] Primary Care Gregorio Worley: LADA, [place] Other Clinician: Referring Delaila Nand: LADA, [place] Treating Efren Kross/Extender: Ricard Dillon Weeks in Treatment: 30 Wound Status Wound Number: 9 Primary Lymphedema Etiology: Wound Location: Left Lower Leg - Lateral Wound Open Wounding Event: Shear/Friction Status: Date Acquired: 05/09/2016 Comorbid Cataracts, Chronic Obstructive Weeks Of Treatment: 5 History: Pulmonary Disease (COPD), Sleep Clustered Wound: No Apnea, Hypertension, Osteoarthritis, Neuropathy Photos Photo Uploaded By: Regan Lemming on 06/18/2016 17:03:07 Wound Measurements Length: (cm) 1 Width: (cm) 0.5 Depth: (cm) 0.1 Area: (cm) 0.393 Volume: (cm) 0.039 % Reduction in Area: 49.9% % Reduction in Volume: 50.6% Epithelialization: None Tunneling: No Undermining: No Wound Description Classification: Partial Thickness Wound Margin: Flat and Intact Exudate Amount: Small Lesniak, Deannah H. ([id number]) Foul Odor After Cleansing: No Slough/Fibrino No Exudate Type: Serosanguineous Exudate Color: red, brown Wound  Bed Granulation Amount: Large (67-100%) Exposed Structure Granulation Quality: Pink, Pale Fascia Exposed: No Necrotic Amount: None Present (0%) Fat Layer (Subcutaneous Tissue) Exposed: No Tendon Exposed: No Muscle Exposed: No Joint Exposed: No Bone Exposed: No Limited to Skin Breakdown Periwound Skin Texture Texture Color No Abnormalities Noted: No No Abnormalities Noted: No Callus: No Atrophie Blanche: No Crepitus: No Cyanosis: No Excoriation: No Ecchymosis: No Induration: No Erythema: No Rash: No Hemosiderin Staining: No Scarring: No Mottled: No Pallor: No Moisture Rubor: No No Abnormalities Noted: No Dry / Scaly: No Temperature / Pain Maceration: No Temperature: No Abnormality Tenderness on Palpation: Yes Wound Preparation Ulcer Cleansing: Other: surg scrub and water, Topical Anesthetic Applied: None Treatment Notes Wound #9 (Left, Lateral Lower Leg) 1. Cleansed with: Cleanse wound with antibacterial soap and water 4. Dressing Applied: Aquacel Ag 5. Secondary Dressing Applied ABD Pad 7. Secured with Tape Other (specify in notes) Notes kerlix and coban from toes to 3cm below the knee TYAHNA, [place] ([id number]) Electronic Signature(s) Signed: 06/18/2016 4:45:11 PM By: Regan Lemming BSN, RN Entered By: Regan Lemming on 06/18/2016 16:12:30 [name] ([id number]) -------------------------------------------------------------------------------- Vitals Details Patient Name: [name] Date of Service: 06/18/2016 3:30 PM Medical Record Number: [id number] Patient Account Number: [id number] Date of Birth/Sex: 1950/08/07 (65 y.o. Female) Treating RN: Afful, RN, BSN, Velva Harman Primary Care Zarai Orsborn: LADA, [place] Other Clinician: Referring Lonia Roane: LADA, [place] Treating Ferol Laiche/Extender: Tito Dine in Treatment: 30 Vital Signs Time Taken: 16:04 Temperature (F): 98.9 Height (in): 63 Pulse (bpm): 67 Weight (lbs): 270 Respiratory Rate  (breaths/min): 18 Body Mass Index (BMI): 47.8 Blood Pressure (mmHg): 138/90 Reference Range: 80 - 120 mg / dl Electronic Signature(s) Signed: 06/18/2016 4:45:11 PM By: Regan Lemming BSN, RN Entered By: Regan Lemming on 06/18/2016 16:05:54

## 2016-06-22 DIAGNOSIS — R296 Repeated falls: Secondary | ICD-10-CM | POA: Diagnosis not present

## 2016-06-22 DIAGNOSIS — G35 Multiple sclerosis: Secondary | ICD-10-CM | POA: Diagnosis not present

## 2016-06-22 DIAGNOSIS — M6281 Muscle weakness (generalized): Secondary | ICD-10-CM | POA: Diagnosis not present

## 2016-06-22 DIAGNOSIS — I739 Peripheral vascular disease, unspecified: Secondary | ICD-10-CM | POA: Diagnosis not present

## 2016-06-24 ENCOUNTER — Telehealth: Payer: Self-pay | Admitting: Family Medicine

## 2016-06-24 DIAGNOSIS — R1900 Intra-abdominal and pelvic swelling, mass and lump, unspecified site: Secondary | ICD-10-CM

## 2016-06-24 DIAGNOSIS — R590 Localized enlarged lymph nodes: Secondary | ICD-10-CM

## 2016-06-24 NOTE — Assessment & Plan Note (Signed)
See CT scan report; refer to surg

## 2016-06-24 NOTE — Assessment & Plan Note (Signed)
Chronic left leg cellulitis; to wound care center after visit here

## 2016-06-24 NOTE — Telephone Encounter (Signed)
-----   Message from Clent Jacks, RN sent at 06/24/2016  2:28 PM EST ----- I spoke to Dr. Mike Gip regarding the biopsy to make sure I told you correct. With lymphoma they need the whole lymph node removed for diagnosis. I told you incorrectly. She will need to go to surgeon for removal of that node instead of IR.

## 2016-06-24 NOTE — Telephone Encounter (Signed)
Thank you, Kristi New referral entered to general surgery [place], please cancel the referral to interventional radiology Thank you

## 2016-06-25 ENCOUNTER — Other Ambulatory Visit: Payer: Self-pay

## 2016-06-25 NOTE — Telephone Encounter (Signed)
done

## 2016-06-26 ENCOUNTER — Encounter: Payer: Self-pay | Admitting: *Deleted

## 2016-06-26 ENCOUNTER — Ambulatory Visit: Payer: Medicare Other | Admitting: Internal Medicine

## 2016-06-27 ENCOUNTER — Telehealth: Payer: Self-pay | Admitting: Family Medicine

## 2016-06-27 ENCOUNTER — Ambulatory Visit: Payer: Self-pay | Admitting: General Surgery

## 2016-06-27 NOTE — Telephone Encounter (Signed)
-----   Message from Dominga Ferry, [place] sent at 06/27/2016  3:53 PM EST ----- Patient was in the referral [place] with an urgent priority. She was a no show for her appointment with Dr. Jamal Collin today. Thanks.

## 2016-06-27 NOTE — Telephone Encounter (Signed)
I called to check on patient; she's fine She thought the appointment was for tomorrow She apologized I gave her Dr. Angie Fava number, [phone] and asked her to call to reschedule ASAP We need to get this started I explained

## 2016-06-28 DIAGNOSIS — M6281 Muscle weakness (generalized): Secondary | ICD-10-CM | POA: Diagnosis not present

## 2016-06-28 DIAGNOSIS — G35 Multiple sclerosis: Secondary | ICD-10-CM | POA: Diagnosis not present

## 2016-06-28 DIAGNOSIS — J449 Chronic obstructive pulmonary disease, unspecified: Secondary | ICD-10-CM | POA: Diagnosis not present

## 2016-06-28 DIAGNOSIS — I739 Peripheral vascular disease, unspecified: Secondary | ICD-10-CM | POA: Diagnosis not present

## 2016-07-01 ENCOUNTER — Ambulatory Visit (INDEPENDENT_AMBULATORY_CARE_PROVIDER_SITE_OTHER): Payer: Medicare Other | Admitting: General Surgery

## 2016-07-01 ENCOUNTER — Encounter: Payer: Self-pay | Admitting: General Surgery

## 2016-07-01 VITALS — BP 122/66 | HR 76 | Resp 14 | Ht 63.0 in | Wt 284.0 lb

## 2016-07-01 DIAGNOSIS — R590 Localized enlarged lymph nodes: Secondary | ICD-10-CM

## 2016-07-01 NOTE — Patient Instructions (Signed)
Open Lymph Node Biopsy An open lymph node biopsy is a procedure to remove a lymph node so that it can be examined under a microscope. Lymph nodes are part of the body's disease-fighting (immune) system. The immune system protects the body from infections, germs, and diseases. An open lymph node biopsy may be done to:  Look for germs or cancer cells in your lymph node.  Find out why your lymph node is swollen.  Find out more about a condition you have. Lymph nodes are found in many locations in the body. Biopsies are often done on lymph nodes in the head, neck, armpit, or groin. Tell a health care provider about:  Any allergies you have.  All medicines you are taking, including vitamins, herbs, eye drops, creams, and over-the-counter medicines.  Any problems you or family members have had with anesthetic medicines.  Any blood disorders you have.  Any surgeries you have had.  Any medical conditions you have.  Whether you are pregnant or may be pregnant. What are the risks? Generally, this is a safe procedure. However, problems may occur, including:  Infection.  Bleeding.  Allergic reactions to medicines.  Damage to other structures or organs, such as a nerve.  Scarring. What happens before the procedure?  Follow instructions from your health care provider about eating or drinking restrictions.  Ask your health care provider about:  Changing or stopping your regular medicines. This is especially important if you are taking diabetes medicines or blood thinners.  Taking medicines such as aspirin and ibuprofen. These medicines can thin your blood. Do not take these medicines before your procedure if your health care provider instructs you not to.  Ask your health care provider how your surgical site will be marked or identified.  You may be given antibiotic medicine to help prevent infection.  You may have an exam or testing.  You may have a blood or urine sample  taken.  Plan to have someone take you home after the procedure.  If you will be going home right after the procedure, plan to have someone with you for 24 hours. What happens during the procedure?  An IV tube will be inserted into one of your veins.  To reduce your risk of infection:  Your health care team will wash or sanitize their hands.  The skin where your lymph node is located will be cleaned with a germ-killing (antiseptic) solution.  You will be given medicine (local anesthetic) to numb the area around your lymph node. You may also be given medicine to help you relax (sedative).  An incision will be made in the area where your lymph node is located.  Your lymph node will be removed.  Your incision will be closed with stitches (sutures).  An antibiotic ointment may be applied to your incision.  A bandage (dressing) will be placed over your incision. What happens after the procedure?  Do not drive for 24 hours if you received a sedative.  Your blood pressure, heart rate, breathing rate, and blood oxygen level will be monitored often until the medicines you were given have worn off.  You may have to wear compression stockings. These stockings help to prevent blood clots and reduce swelling in your legs. This information is not intended to replace advice given to you by your health care provider. Make sure you discuss any questions you have with your health care provider. Document Released: 05/12/2015 Document Revised: 09/21/2015 Document Reviewed: 08/10/2014 Elsevier Interactive Patient Education  2017   [employer].

## 2016-07-01 NOTE — Progress Notes (Addendum)
Patient ID: [name], female   DOB: 06/07/1950, 65 y.o.   MRN: [id number]  Chief Complaint  Patient presents with  . Other    enlarged lymph node    HPI [name] is a 65 y.o. female here for evaluation for enlarged lymph node of the left groin area. She developed left leg swelling which prompted an US- No DVT noted but very large left inguinal nodes were seen. Subsequent CT shows markedly enlarged left Iliac and inguinal nodes. Biopsy requested for this. Pt has MS and has recently moved to an assisted care facilty. Has a chronic indwelling foley .  I have reviewed the history of present illness with the patient.   HPI  Past Medical History:  Diagnosis Date  . ADHD   . Anxiety   . COPD (chronic obstructive pulmonary disease) (HCC)   . Depression    major depressive  . Dyspnea    doe  . Edema    left leg  . Hypertension   . Hypotension    idiopathic  . Kyphoscoliosis and scoliosis 11/26/2011  . Morbid obesity (HCC) 01/05/2016  . Multiple sclerosis (HCC)   . Neuromuscular disorder (HCC)   . Obstructive and reflux uropathy    foley  . Pain    atypical facial  . Peripheral vascular disease of lower extremity with ulceration (HCC) 11/08/2015  . Skin ulcer (HCC) 11/08/2015  . Weakness    generalized. has MS    Past Surgical History:  Procedure Laterality Date  . BACK SURGERY N/A 2002  . CYST EXCISION     lower back  . TONSILLECTOMY AND ADENOIDECTOMY    . TUBAL LIGATION      Family History  Problem Relation Age of Onset  . COPD Mother   . Diabetes Mother   . Heart failure Mother   . Alcohol abuse Father   . Kidney disease Father   . Kidney failure Father   . Arthritis Sister   . CAD Maternal Grandmother   . Stroke Maternal Grandfather   . Diabetes Sister     Social History Social History  Substance Use Topics  . Smoking status: Former Smoker    Types: Cigarettes    Quit date: 02/03/2016  . Smokeless tobacco: Never Used  . Alcohol use 0.0 oz/week   Comment: Pt states occasionally (liquor)    No Known Allergies  No current facility-administered medications for this visit.    No current outpatient prescriptions on file.   Facility-Administered Medications Ordered in Other Visits  Medication Dose Route Frequency Provider Last Rate Last Dose  . ceFAZolin (ANCEF) 2-4 GM/100ML-% IVPB           . ceFAZolin (ANCEF) IVPB 2g/100 mL premix  2 g Intravenous On Call to OR Seeplaputhur G Sankar, MD      . Chlorhexidine Gluconate Cloth 2 % PADS 6 each  6 each Topical Once Seeplaputhur G Sankar, MD      . lactated ringers infusion   Intravenous Continuous Mathai Thomas, MD 75 mL/hr at 07/04/16 1124      Review of Systems Review of Systems  Respiratory: Negative.   Cardiovascular: Negative.   Gastrointestinal: Negative.   Genitourinary: Positive for difficulty urinating.  Neurological: Positive for weakness and numbness.    Blood pressure 122/66, pulse 76, resp. rate 14, height 5' 3" (1.6 m), weight 284 lb (128.8 kg).  Physical Exam Physical Exam  Constitutional: She is oriented to person, place, and time. She appears well-developed and   well-nourished.  Pt in wheel chair, very slow with movement, did manage to stand and sit on exam table.  Eyes: Conjunctivae are normal. No scleral icterus.  Neck: Neck supple.  Cardiovascular: Normal rate and regular rhythm.   Pulmonary/Chest: Effort normal and breath sounds normal.  Abdominal: Soft. Bowel sounds are normal.  Lymphadenopathy:    She has no cervical adenopathy.    She has no axillary adenopathy.       Right: No inguinal adenopathy present.       Left: Inguinal adenopathy present.  Hard ill defined mass left groin  Neurological: She is alert and oriented to person, place, and time.  Skin: Skin is warm and dry.    Data Reviewed CT scan reviewed  Assessment    Suspicious mass/lymph nodes- suspect lymphoma.    Plan   Given her overall condition would prefer to biopsy left  inguinal node in SDS with monitored anesthesia care Patient's surgery has been scheduled for 07-04-16 at [facility].     This has been scribed by Caryl-Lyn M Kennedy LPN    SANKAR,SEEPLAPUTHUR G 07/04/2016, 12:40 PM   

## 2016-07-02 ENCOUNTER — Telehealth: Payer: Self-pay

## 2016-07-02 ENCOUNTER — Encounter: Payer: No Typology Code available for payment source | Attending: Internal Medicine | Admitting: Internal Medicine

## 2016-07-02 DIAGNOSIS — G35 Multiple sclerosis: Secondary | ICD-10-CM | POA: Diagnosis not present

## 2016-07-02 DIAGNOSIS — Z79899 Other long term (current) drug therapy: Secondary | ICD-10-CM | POA: Insufficient documentation

## 2016-07-02 DIAGNOSIS — L97512 Non-pressure chronic ulcer of other part of right foot with fat layer exposed: Secondary | ICD-10-CM | POA: Diagnosis not present

## 2016-07-02 DIAGNOSIS — G4733 Obstructive sleep apnea (adult) (pediatric): Secondary | ICD-10-CM | POA: Diagnosis not present

## 2016-07-02 DIAGNOSIS — L043 Acute lymphadenitis of lower limb: Secondary | ICD-10-CM | POA: Insufficient documentation

## 2016-07-02 DIAGNOSIS — I739 Peripheral vascular disease, unspecified: Secondary | ICD-10-CM | POA: Insufficient documentation

## 2016-07-02 DIAGNOSIS — J449 Chronic obstructive pulmonary disease, unspecified: Secondary | ICD-10-CM | POA: Diagnosis not present

## 2016-07-02 DIAGNOSIS — L97829 Non-pressure chronic ulcer of other part of left lower leg with unspecified severity: Secondary | ICD-10-CM | POA: Diagnosis not present

## 2016-07-02 DIAGNOSIS — R32 Unspecified urinary incontinence: Secondary | ICD-10-CM | POA: Insufficient documentation

## 2016-07-02 DIAGNOSIS — Z6841 Body Mass Index (BMI) 40.0 and over, adult: Secondary | ICD-10-CM | POA: Insufficient documentation

## 2016-07-02 DIAGNOSIS — I10 Essential (primary) hypertension: Secondary | ICD-10-CM | POA: Insufficient documentation

## 2016-07-02 DIAGNOSIS — F419 Anxiety disorder, unspecified: Secondary | ICD-10-CM | POA: Insufficient documentation

## 2016-07-02 DIAGNOSIS — F17218 Nicotine dependence, cigarettes, with other nicotine-induced disorders: Secondary | ICD-10-CM | POA: Insufficient documentation

## 2016-07-02 DIAGNOSIS — L97212 Non-pressure chronic ulcer of right calf with fat layer exposed: Secondary | ICD-10-CM | POA: Insufficient documentation

## 2016-07-02 NOTE — Telephone Encounter (Signed)
Spoke with Marliss Coots, the patient's nurse at [facility] where she is staying. I notified her that the patient has been scheduled for Pre Admit testing at the hospital on 07/03/16 at 11:15 am and she will be having surgery at [facility] on 07/04/16. She will arrange transportation for the patient for both of these.

## 2016-07-03 ENCOUNTER — Encounter
Admission: RE | Admit: 2016-07-03 | Discharge: 2016-07-03 | Disposition: A | Discharge status: Home / Self Care | Payer: Medicare Other | Source: Ambulatory Visit | Attending: General Surgery | Admitting: General Surgery

## 2016-07-03 DIAGNOSIS — Z836 Family history of other diseases of the respiratory system: Secondary | ICD-10-CM | POA: Diagnosis not present

## 2016-07-03 DIAGNOSIS — G35 Multiple sclerosis: Secondary | ICD-10-CM | POA: Diagnosis not present

## 2016-07-03 DIAGNOSIS — Z841 Family history of disorders of kidney and ureter: Secondary | ICD-10-CM | POA: Diagnosis not present

## 2016-07-03 DIAGNOSIS — Z87891 Personal history of nicotine dependence: Secondary | ICD-10-CM | POA: Diagnosis not present

## 2016-07-03 DIAGNOSIS — Z8261 Family history of arthritis: Secondary | ICD-10-CM | POA: Diagnosis not present

## 2016-07-03 DIAGNOSIS — I739 Peripheral vascular disease, unspecified: Secondary | ICD-10-CM | POA: Diagnosis not present

## 2016-07-03 DIAGNOSIS — C8205 Follicular lymphoma grade I, lymph nodes of inguinal region and lower limb: Secondary | ICD-10-CM | POA: Diagnosis not present

## 2016-07-03 DIAGNOSIS — Z9889 Other specified postprocedural states: Secondary | ICD-10-CM | POA: Diagnosis not present

## 2016-07-03 DIAGNOSIS — I10 Essential (primary) hypertension: Secondary | ICD-10-CM | POA: Diagnosis not present

## 2016-07-03 DIAGNOSIS — Z823 Family history of stroke: Secondary | ICD-10-CM | POA: Diagnosis not present

## 2016-07-03 DIAGNOSIS — Z7951 Long term (current) use of inhaled steroids: Secondary | ICD-10-CM | POA: Diagnosis not present

## 2016-07-03 DIAGNOSIS — C8215 Follicular lymphoma grade II, lymph nodes of inguinal region and lower limb: Secondary | ICD-10-CM | POA: Diagnosis not present

## 2016-07-03 DIAGNOSIS — Z833 Family history of diabetes mellitus: Secondary | ICD-10-CM | POA: Diagnosis not present

## 2016-07-03 DIAGNOSIS — J449 Chronic obstructive pulmonary disease, unspecified: Secondary | ICD-10-CM | POA: Diagnosis not present

## 2016-07-03 DIAGNOSIS — Z8249 Family history of ischemic heart disease and other diseases of the circulatory system: Secondary | ICD-10-CM | POA: Diagnosis not present

## 2016-07-03 DIAGNOSIS — M419 Scoliosis, unspecified: Secondary | ICD-10-CM | POA: Diagnosis not present

## 2016-07-03 DIAGNOSIS — Z79899 Other long term (current) drug therapy: Secondary | ICD-10-CM | POA: Diagnosis not present

## 2016-07-03 HISTORY — DX: Edema, unspecified: R60.9

## 2016-07-03 HISTORY — DX: Hypotension, unspecified: I95.9

## 2016-07-03 HISTORY — DX: Weakness: R53.1

## 2016-07-03 HISTORY — DX: Pain, unspecified: R52

## 2016-07-03 HISTORY — DX: Dyspnea, unspecified: R06.00

## 2016-07-03 HISTORY — DX: Obstructive and reflux uropathy, unspecified: N13.9

## 2016-07-03 HISTORY — DX: Attention-deficit hyperactivity disorder, unspecified type: F90.9

## 2016-07-03 LAB — POTASSIUM: Potassium: 3.6 mmol/L (ref 3.5–5.1)

## 2016-07-03 MED ORDER — CEFAZOLIN SODIUM-DEXTROSE 2-4 GM/100ML-% IV SOLN
2.0000 g | INTRAVENOUS | Status: AC
  Administered 2016-07-04: 2 g via INTRAVENOUS

## 2016-07-03 NOTE — Pre-Procedure Instructions (Signed)
PREOP INSTRUCTIONS FAXED AND CALLED TO BELINDA AT [facility]. THEY ARE TO CONTACT PCP OR NEUROLOGIST TO SEE IF AMPYRA IS TO BE TAKEN IN AM OR NOT AS INSTRUCTED BY DR Gildardo Griffes

## 2016-07-03 NOTE — Progress Notes (Signed)
TYSHANA, NISHIDA ([id number]) Visit Report for 07/02/2016 Arrival Information Details Patient Name: [name], [name] Date of Service: 07/02/2016 1:30 PM Medical Record Number: [id number] Patient Account Number: [id number] Date of Birth/Sex: [birth year] (66 y.o. Female) Treating RN: Montey Hora Primary Care Mykenzi Vanzile: LADA, [place] Other Clinician: Referring Charlesa Ehle: LADA, [place] Treating Jemal Miskell/Extender: Tito Dine in Treatment: 19 Visit Information History Since Last Visit Added or deleted any medications: No Patient Arrived: Wheel Chair Any new allergies or adverse reactions: No Arrival Time: 13:35 Had a fall or experienced change in No activities of daily living that may affect Accompanied By: staff risk of falls: Transfer Assistance: Manual Signs or symptoms of abuse/neglect since last No Patient Identification Verified: Yes visito Secondary Verification Process Yes Hospitalized since last visit: No Completed: Has Dressing in Place as Prescribed: Yes Patient Requires Transmission-Based No Has Compression in Place as Prescribed: Yes Precautions: Pain Present Now: Yes Patient Has Alerts: Yes Electronic Signature(s) Signed: 07/02/2016 4:54:24 PM By: Montey Hora Entered By: Montey Hora on 07/02/2016 13:39:29 [name] ([id number]) -------------------------------------------------------------------------------- Clinic Level of Care Assessment Details Patient Name: [name] Date of Service: 07/02/2016 1:30 PM Medical Record Number: [id number] Patient Account Number: [id number] Date of Birth/Sex: May 16, 1950 (66 y.o. Female) Treating RN: Montey Hora Primary Care Alexzandrea Normington: LADA, [place] Other Clinician: Referring Janera Peugh: LADA, [place] Treating Camiyah Friberg/Extender: Tito Dine in Treatment: 32 Clinic Level of Care Assessment Items TOOL 4 Quantity Score []  - Use when only an EandM is performed on FOLLOW-UP visit 0 ASSESSMENTS - Nursing  Assessment / Reassessment X - Reassessment of Co-morbidities (includes updates in patient status) 1 10 X - Reassessment of Adherence to Treatment Plan 1 5 ASSESSMENTS - Wound and Skin Assessment / Reassessment X - Simple Wound Assessment / Reassessment - one wound 1 5 []  - Complex Wound Assessment / Reassessment - multiple wounds 0 []  - Dermatologic / Skin Assessment (not related to wound area) 0 ASSESSMENTS - Focused Assessment []  - Circumferential Edema Measurements - multi extremities 0 []  - Nutritional Assessment / Counseling / Intervention 0 X - Lower Extremity Assessment (monofilament, tuning fork, pulses) 1 5 []  - Peripheral Arterial Disease Assessment (using hand held doppler) 0 ASSESSMENTS - Ostomy and/or Continence Assessment and Care []  - Incontinence Assessment and Management 0 []  - Ostomy Care Assessment and Management (repouching, etc.) 0 PROCESS - Coordination of Care X - Simple Patient / Family Education for ongoing care 1 15 []  - Complex (extensive) Patient / Family Education for ongoing care 0 []  - Staff obtains Programmer, systems, Records, Test Results / Process Orders 0 []  - Staff telephones HHA, Nursing Homes / Clarify orders / etc 0 []  - Routine Transfer to another Facility (non-emergent condition) 0 Meddings, Angenette H. ([id number]) []  - Routine Hospital Admission (non-emergent condition) 0 []  - New Admissions / Biomedical engineer / Ordering NPWT, Apligraf, etc. 0 []  - Emergency Hospital Admission (emergent condition) 0 X - Simple Discharge Coordination 1 10 []  - Complex (extensive) Discharge Coordination 0 PROCESS - Special Needs []  - Pediatric / Minor Patient Management 0 []  - Isolation Patient Management 0 []  - Hearing / Language / Visual special needs 0 []  - Assessment of Community assistance (transportation, D/C planning, etc.) 0 []  - Additional assistance / Altered mentation 0 []  - Support Surface(s) Assessment (bed, cushion, seat, etc.) 0 INTERVENTIONS - Wound  Cleansing / Measurement X - Simple Wound Cleansing - one wound 1 5 []  - Complex Wound Cleansing - multiple wounds 0 X - Wound Imaging (  photographs - any number of wounds) 1 5 []  - Wound Tracing (instead of photographs) 0 X - Simple Wound Measurement - one wound 1 5 []  - Complex Wound Measurement - multiple wounds 0 INTERVENTIONS - Wound Dressings []  - Small Wound Dressing one or multiple wounds 0 []  - Medium Wound Dressing one or multiple wounds 0 []  - Large Wound Dressing one or multiple wounds 0 []  - Application of Medications - topical 0 []  - Application of Medications - injection 0 INTERVENTIONS - Miscellaneous []  - External ear exam 0 Buhl, Shaia H. ([id number]) []  - Specimen Collection (cultures, biopsies, blood, body fluids, etc.) 0 []  - Specimen(s) / Culture(s) sent or taken to Lab for analysis 0 []  - Patient Transfer (multiple staff / Harrel Lemon Lift / Similar devices) 0 []  - Simple Staple / Suture removal (25 or less) 0 []  - Complex Staple / Suture removal (26 or more) 0 []  - Hypo / Hyperglycemic Management (close monitor of Blood Glucose) 0 []  - Ankle / Brachial Index (ABI) - do not check if billed separately 0 X - Vital Signs 1 5 Has the patient been seen at the hospital within the last three years: Yes Total Score: 70 Level Of Care: New/Established - Level 2 Electronic Signature(s) Signed: 07/02/2016 4:54:24 PM By: Montey Hora Entered By: Montey Hora on 07/02/2016 16:35:03 [name] ([id number]) -------------------------------------------------------------------------------- Encounter Discharge Information Details Patient Name: [name] Date of Service: 07/02/2016 1:30 PM Medical Record Number: [id number] Patient Account Number: [id number] Date of Birth/Sex: July 26, 1950 (66 y.o. Female) Treating RN: Montey Hora Primary Care Adison Reifsteck: LADA, [place] Other Clinician: Referring Marie Chow: LADA, [place] Treating Sunny Aguon/Extender: Tito Dine in  Treatment: 19 Encounter Discharge Information Items Discharge Pain Level: 0 Discharge Condition: Stable Ambulatory Status: Wheelchair Discharge Destination: Nursing Home Transportation: Private Auto Accompanied By: staff Schedule Follow-up Appointment: No Medication Reconciliation completed No and provided to Patient/Care [place] Hester: Provided on Clinical Summary of Care: 07/02/2016 Form Type Recipient Paper Patient BW Electronic Signature(s) Signed: 07/02/2016 4:35:30 PM By: Montey Hora Previous Signature: 07/02/2016 2:07:46 PM Version By: Ruthine Dose Entered By: Montey Hora on 07/02/2016 16:35:30 [name] ([id number]) -------------------------------------------------------------------------------- Lower Extremity Assessment Details Patient Name: [name] Date of Service: 07/02/2016 1:30 PM Medical Record Number: [id number] Patient Account Number: [id number] Date of Birth/Sex: [birth year] (66 y.o. Female) Treating RN: Montey Hora Primary Care Jayveon Convey: LADA, [place] Other Clinician: Referring Sufyan Meidinger: LADA, [place] Treating Kierstynn Babich/Extender: Ricard Dillon Weeks in Treatment: 32 Edema Assessment Assessed: [Left: No] [Right: No] Edema: [Left: Ye] [Right: s] Calf Left: Right: Point of Measurement: 33 cm From Medial Instep 44.5 cm cm Ankle Left: Right: Point of Measurement: 11 cm From Medial Instep 21.3 cm cm Vascular Assessment Pulses: Dorsalis Pedis Palpable: [Left:Yes] Posterior Tibial Extremity colors, hair growth, and conditions: Extremity Color: [Left:Normal] Hair Growth on Extremity: [Left:No] Temperature of Extremity: [Left:Warm] Capillary Refill: [Left:< 3 seconds] Electronic Signature(s) Signed: 07/02/2016 4:54:24 PM By: Montey Hora Entered By: Montey Hora on 07/02/2016 13:44:59 Reinheimer, [name] ([id number]) -------------------------------------------------------------------------------- Multi Wound Chart Details Patient Name: [name] Date of Service: 07/02/2016 1:30 PM Medical Record Number: [id number] Patient Account Number: [id number] Date of Birth/Sex: Jan 15, 1951 (66 y.o. Female) Treating RN: Montey Hora Primary Care Davaun Quintela: LADA, [place] Other Clinician: Referring Kamara Allan: LADA, [place] Treating Maelynn Moroney/Extender: Ricard Dillon Weeks in Treatment: 32 Vital Signs Height(in): 63 Pulse(bpm): 68 Weight(lbs): 270 Blood Pressure 139/63 (mmHg): Body Mass Index(BMI): 48 Temperature(F): 98.2 Respiratory Rate 18 (breaths/min): Photos: [N/A:N/A] Wound Location: Left, Lateral  Lower Leg N/A N/A Wounding Event: Shear/Friction N/A N/A Primary Etiology: Lymphedema N/A N/A Comorbid History: Cataracts, Chronic N/A N/A Obstructive Pulmonary Disease (COPD), Sleep Apnea, Hypertension, Osteoarthritis, Neuropathy Date Acquired: 05/09/2016 N/A N/A Weeks of Treatment: 7 N/A N/A Wound Status: Healed - Epithelialized N/A N/A Measurements L x W x D 0x0x0 N/A N/A (cm) Area (cm) : 0 N/A N/A Volume (cm) : 0 N/A N/A % Reduction in Area: 100.00% N/A N/A % Reduction in Volume: 100.00% N/A N/A Classification: Partial Thickness N/A N/A Exudate Amount: Small N/A N/A Exudate Type: Serosanguineous N/A N/A Exudate Color: red, brown N/A N/A Wound Margin: Flat and Intact N/A N/A Granulation Amount: Large (67-100%) N/A N/A Kressin, Shailee Lemmie Evens ([id number]) Granulation Quality: Pink, Pale N/A N/A Necrotic Amount: None Present (0%) N/A N/A Exposed Structures: Fascia: No N/A N/A Fat Layer (Subcutaneous Tissue) Exposed: No Tendon: No Muscle: No Joint: No Bone: No Limited to Skin Breakdown Epithelialization: Large (67-100%) N/A N/A Periwound Skin Texture: Excoriation: No N/A N/A Induration: No Callus: No Crepitus: No Rash: No Scarring: No Periwound Skin Maceration: No N/A N/A Moisture: Dry/Scaly: No Periwound Skin Color: Atrophie Blanche: No N/A N/A Cyanosis: No Ecchymosis: No Erythema: No Hemosiderin  Staining: No Mottled: No Pallor: No Rubor: No Temperature: No Abnormality N/A N/A Tenderness on Yes N/A N/A Palpation: Wound Preparation: Ulcer Cleansing: Other: N/A N/A surg scrub and water Topical Anesthetic Applied: None Treatment Notes Electronic Signature(s) Signed: 07/03/2016 8:02:19 AM By: Linton Ham MD Entered By: Linton Ham on 07/02/2016 13:58:44 [name] ([id number]) -------------------------------------------------------------------------------- Multi-Disciplinary Care Plan Details Patient Name: [name] Date of Service: 07/02/2016 1:30 PM Medical Record Number: [id number] Patient Account Number: [id number] Date of Birth/Sex: 1951/04/15 (66 y.o. Female) Treating RN: Montey Hora Primary Care Dontrel Smethers: LADA, [place] Other Clinician: Referring Miosotis Wetsel: LADA, [place] Treating Janan Bogie/Extender: Ricard Dillon Weeks in Treatment: 32 Active Inactive Electronic Signature(s) Signed: 07/02/2016 4:54:24 PM By: Montey Hora Entered By: Montey Hora on 07/02/2016 13:53:32 Whichard, [name] ([id number]) -------------------------------------------------------------------------------- Pain Assessment Details Patient Name: [name] Date of Service: 07/02/2016 1:30 PM Medical Record Number: [id number] Patient Account Number: [id number] Date of Birth/Sex: [birth year] (66 y.o. Female) Treating RN: Montey Hora Primary Care Brelan Hannen: LADA, [place] Other Clinician: Referring Laramie Meissner: LADA, [place] Treating Jacorie Ernsberger/Extender: Tito Dine in Treatment: 32 Active Problems Location of Pain Severity and Description of Pain Patient Has Paino Yes Site Locations Pain Location: Generalized Pain With Dressing Change: No Pain Management and Medication Current Pain Management: Notes Topical or injectable lidocaine is offered to patient for acute pain when surgical debridement is performed. If needed, Patient is instructed to use over the  counter pain medication for the following 24-48 hours after debridement. Wound care MDs do not prescribed pain medications. Patient has chronic pain or uncontrolled pain. Patient has been instructed to make an appointment with their Primary Care Physician for pain management. Electronic Signature(s) Signed: 07/02/2016 4:54:24 PM By: Montey Hora Entered By: Montey Hora on 07/02/2016 13:40:04 [name] ([id number]) -------------------------------------------------------------------------------- Patient/Caregiver Education Details Patient Name: [name] Date of Service: 07/02/2016 1:30 PM Medical Record Patient Account Number: [id number] Number: Treating RN: Montey Hora Mar 19, 1951 (65 y.o. Other Clinician: Date of Birth/Gender: Female) Treating ROBSON, MICHAEL Primary Care Physician: LADA, MELINDA Physician/Extender: G Referring Physician: LADA, MELINDA Weeks in Treatment: 50 Education Assessment Education Provided To: Patient and Caregiver Education Topics Provided Basic Hygiene: Handouts: Other: needs ongoing compression on both legs Methods: Explain/Verbal, Printed Responses: State content correctly Electronic Signature(s) Signed: 07/02/2016 4:54:24 PM  By: Montey Hora Entered By: Montey Hora on 07/02/2016 16:36:03 [name] ([id number]) -------------------------------------------------------------------------------- Wound Assessment Details Patient Name: [name] Date of Service: 07/02/2016 1:30 PM Medical Record Number: [id number] Patient Account Number: [id number] Date of Birth/Sex: 1951/03/24 (65 y.o. Female) Treating RN: Montey Hora Primary Care Guida Asman: LADA, [place] Other Clinician: Referring Racquelle Hyser: LADA, [place] Treating Janeisha Ryle/Extender: Ricard Dillon Weeks in Treatment: 32 Wound Status Wound Number: 9 Primary Lymphedema Etiology: Wound Location: Left, Lateral Lower Leg Wound Healed - Epithelialized Wounding  Event: Shear/Friction Status: Date Acquired: 05/09/2016 Comorbid Cataracts, Chronic Obstructive Weeks Of Treatment: 7 History: Pulmonary Disease (COPD), Sleep Clustered Wound: No Apnea, Hypertension, Osteoarthritis, Neuropathy Photos Wound Measurements Length: (cm) 0 % Reduction in Width: (cm) 0 % Reduction in Depth: (cm) 0 Epithelializat Area: (cm) 0 Tunneling: Volume: (cm) 0 Undermining: Area: 100% Volume: 100% ion: Large (67-100%) No No Wound Description Classification: Partial Thickness Wound Margin: Flat and Intact Exudate Amount: Small Exudate Type: Serosanguineous Exudate Color: red, brown Foul Odor After Cleansing: No Slough/Fibrino No Wound Bed Granulation Amount: Large (67-100%) Exposed Structure Granulation Quality: Pink, Pale Fascia Exposed: No Necrotic Amount: None Present (0%) Fat Layer (Subcutaneous Tissue) Exposed: No Tendon Exposed: No Depierro, Robyne H. ([id number]) Muscle Exposed: No Joint Exposed: No Bone Exposed: No Limited to Skin Breakdown Periwound Skin Texture Texture Color No Abnormalities Noted: No No Abnormalities Noted: No Callus: No Atrophie Blanche: No Crepitus: No Cyanosis: No Excoriation: No Ecchymosis: No Induration: No Erythema: No Rash: No Hemosiderin Staining: No Scarring: No Mottled: No Pallor: No Moisture Rubor: No No Abnormalities Noted: No Dry / Scaly: No Temperature / Pain Maceration: No Temperature: No Abnormality Tenderness on Palpation: Yes Wound Preparation Ulcer Cleansing: Other: surg scrub and water, Topical Anesthetic Applied: None Electronic Signature(s) Signed: 07/02/2016 4:54:24 PM By: Montey Hora Entered By: Montey Hora on 07/02/2016 13:53:15 [name] ([id number]) -------------------------------------------------------------------------------- Vitals Details Patient Name: [name] Date of Service: 07/02/2016 1:30 PM Medical Record Number: [id number] Patient Account Number:  [id number] Date of Birth/Sex: [birth year] (66 y.o. Female) Treating RN: Montey Hora Primary Care Avaley Coop: LADA, [place] Other Clinician: Referring [place] Zirkelbach: LADA, [place] Treating Edra Riccardi/Extender: Ricard Dillon Weeks in Treatment: 32 Vital Signs Time Taken: 13:40 Temperature (F): 98.2 Height (in): 63 Pulse (bpm): 68 Weight (lbs): 270 Respiratory Rate (breaths/min): 18 Body Mass Index (BMI): 47.8 Blood Pressure (mmHg): 139/63 Reference Range: 80 - 120 mg / dl Electronic Signature(s) Signed: 07/02/2016 4:54:24 PM By: Montey Hora Entered By: Montey Hora on 07/02/2016 13:41:03

## 2016-07-03 NOTE — Progress Notes (Signed)
[name] ([id number]) Visit Report for 07/02/2016 Chief Complaint Document Details Patient Name: [name] Date of Service: 07/02/2016 1:30 PM Medical Record Patient Account Number: [id number] Number: Treating RN: Montey Hora 12/17/1950 (66 y.o. Other Clinician: Date of Birth/Sex: Female) Treating Mikie Misner Primary Care Provider: LADA, MELINDA Provider/Extender: G Referring Provider: LADA, MELINDA Weeks in Treatment: 32 Information Obtained from: Patient Chief Complaint returns for evaluation of bilateral lower extremity ulcerations Electronic Signature(s) Signed: 07/03/2016 8:02:19 AM By: Linton Ham MD Entered By: Linton Ham on 07/02/2016 13:59:43 [name] ([id number]) -------------------------------------------------------------------------------- HPI Details Patient Name: [name] Date of Service: 07/02/2016 1:30 PM Medical Record Patient Account Number: [id number] Number: Treating RN: Montey Hora 1951/01/17 (66 y.o. Other Clinician: Date of Birth/Sex: Female) Treating Davi Kroon Primary Care Provider: LADA, MELINDA Provider/Extender: G Referring Provider: LADA, MELINDA Weeks in Treatment: 32 History of Present Illness Location: left and right lower extremity and dorsum of the right foot Quality: Patient reports experiencing a sharp pain to affected area(s). Severity: Patient rates the pain to be a 8 out of 10 especially with palpation Duration: Patient has had the wound for > 6 months prior to seeking treatment at the wound center Timing: Pain in wound is Intermittent in severity but persistent Context: The wound appeared gradually over time Modifying Factors: Other treatment(s) tried include: symptomatic treatment as advised by her PCP Associated Signs and Symptoms: Patient reports having increase swelling iin her bilateral lower extremities HPI Description: 66 year old with a past medical history  significant for MS, urinary incontinence, and obesity. She has been seen in the wound clinic before for lower extremity ulcerations treated with compression therapy. she is also known to have hypertension, peripheral vascular disease, COPD, obstructive sleep apnea, lumbar radiculopathy, kyphoscoliosis, urinary issues and tobacco abuse. Smokes a packet of cigarettes a day was recently seen at the [facility] for swelling of her legs and feet with a ulceration on the dorsum of the right foot which has been there for about 6 months. she was recently in the ER about a month ago where she was seen for shortness of breath and swelling of the legs and a chest x-ray was within normal limits had an increase in her BNP and was given Lasix and put on doxycycline for a mild cellulitis and possible UTI. Wounds aresmaller today 11/13/15. Debrided and will continue Santyl. 12/21/2015 -- she was admitted to the hospital overnight on 12/18/2015 and diagnosed with urinary retention and cellulitis of the left lower leg. is past to take clindamycin and use Santyl for her wounds. 01/15/2016 -- come back to see [place] for almost a month and continues to be noncompliant with her dressings 01/30/16 patient presents today for a follow-up visit concerning her ongoing bilateral lower extremity wounds. We have not seen her for the past 2 weeks although we are supposed to be seen her for weekly visits. She currently has a Foley catheter. She tells me that the wounds are intensely painful especially with pressure and palpation at this point in time. Fortunately she is having no interval signs or symptoms of systemic infection but unfortunately the wounds have not improved dramatically since we last saw her. She does have home health coming to take care of her as well. She is currently not in any compression wraps. 02/06/16 ON evaluation today patient continues to experience discomfort regarding her bilateral  lower extremity ulcers. She has continued to tolerate the dressing changes at this point in time and continues  to have a Foley catheter as well. Fortunately she is back this week in the past she has been somewhat noncompliant with follow-ups I'm glad to see her today. She tells me that her pain level varies but can be as high as a 7 out of 10 with manipulation of the wound. She tells me that she used to be on oxycodone which [name], [name] ([id number]) was managed by the pain clinic although she is no longer on that as she tells me that she was actually smoking marijuana at the time and when this was found out they discontinued her pain medication. She no longer is taking anything pain medication wise and she also does not smoke cigarettes nor marijuana at this point. 02/12/16; this is a patient I don't believe I have previously seen. She has multiple sclerosis. She has 4 punched-out areas on the anterior lateral left leg 2 areas on the right these are all in the same condition. Reasonably small [dime size] wounds each was some depth. Surface of these wounds does not look particularly healthy as there is adherent slough. There is no evidence of surrounding infection or inflammation. ABIs in this clinic were 0.87 on the right and 0.81 on the left. She is listed as having PAD and is a smoker. Not a diabetic 02/20/16 patient I gave antibiotics to last week for erythema around both wound sites on the left lateral leg and right lateral leg. This appears to be a lot better. One of the areas on the left leg is healed however she still has 3 punched-out open areas on the left lateral calf and one on the right lateral calf and one on the dorsal foot. She is an ex-smoker quitting 1 month ago. ABIs in this clinic were 0.87 on the right 0.81 on the left 03/12/16; this is a patient I really don't have a good sense of. It would appear for the first 5 or 6 weeks of her stay in this clinic she was cared for by  Dr. Con Memos. She appeared on my schedule in mid October and I saw her twice. She has not been here however in 3 weeks. She has advanced Wound Care at home where she lives with her husband. She has multiple sclerosis. I saw her the first time she had 4 punched-out small wounds on the anterior lateral left leg it appears that she is now down to 2. She also had wounds on the lateral and medial aspect of her right leg which were also small and punched out. The only one that remains is on the right lateral. Because of the nature of her wound I went ahead and ordered formal arterial studies this showed an ABI of 1 on the right and one on the left. TBI of 1.4 on the right and 0.79 on the left. She had normal waveforms. She was in the emergency room on 02/28/16; there is a noted edema of her left leg after a fall. She had a duplex ultrasound that showed no evidence for an acute DVT from the left groin to the popliteal fossa. The study was limited in the calf veins due to edema. Also noticeable that she had a left inguinal enlarged lymph node up to 6.2 cm. She also had a left knee x-ray that showed no acute findings and a right foot x-ray that showed soft tissue swelling but no radiographic evidence of osteomyelitis. The patient does stop smoking 03-19-16 Ms Holton presents today for evaluation of her bilateral lower  extremity ulcerations. She states that she has not smoked in several weeks. She denies any pain or discomfort to bilateral lower extremities, tolerating compression therapy as ordered. 03/26/16; I have not seen this patient in 2 weeks however she has done very well with improvements in the areas in question. After we obtain normal arterial studies, increasing the 4-layer compression really seems to look done a nice job here. She has one open area on the left lateral leg and one on the medial right leg. These have filled in nicely and are now superficial wounds that showed epithelialized. I note  the left inguinal lymph node at 6.2 cm. I may need to refer this back to the patient's primary doctor. 04/03/16; patient has 2 remaining wounds 1 on the left lateral leg and one on the right medial leg. Both of filled in nicely since we are able to increase her from a 3 layer to a 4 layer compression. I am also following up on the lymph node notable on her left leg DVT rule out in November 04/10/16; area on the right lateral leg is healed. The area on the left leg is still open but looks considerably better with healthy granulation and less wound area. I12/20/17; last week we healed the patient doubt with regards to the wound on the right leg to her on compression stocking 20-30 mmHg as it turns out I don't think she had a stocking, predictably therefore she has reopened. The left leg as well as the wound on the lateral aspect. Been generally small line 04/24/16; we healed out her right leg 2 weeks ago to her own 20-30 mm compression stockings although as it turns out she didn't have these and did not purchase some apparently because of financial issues. The left leg wound is on the lateral aspect. Both of these wounds are just about healed. 05/01/16; her wounds on the left leg are healed out today. The area on the right leg is also healed. Vitamin diligent effort of our staff we have not been able to get any form of compression stocking to this patient. The MAIREN, WALLENSTEIN ([id number]) orders for juxtalite's were given to home health this never materialized. We ordered compression stockings I don't think she was able to afford these. We had a discussion today with both the patient and her husband without these, will accumulate edema and the wounds will simply reopen again. 05/14/16 READMISSION this is a patient we healed out 2 weeks ago. As bilateral lower extremity venous wounds probably some degree of lymphedema. At the beginning in November I did a duplex ultrasound of her left leg that  was negative for DVT but did show a lymph node in the left inguinal area presumably reactive. We've been using compression to her lower extremities eventually healed all her wounds on her bilateral calves but we were not able to get any form of compression stockings for her in spite of intense efforts of our staff. She returns today with reopening on the left leg did not the right she has several superficial areas anteriorly but an actual frank ulcer on the lateral left calf. This is about the size of a dime or smaller. She does not really complain of pain. The patient has a history of PAD however arterial studies we did in November were normal. Her ABIs and Doppler waveforms were both within the normal limits. The patient has a history of MS. In discussing things with her today it would appear that the opening  was noted 2 weeks ago according to the patient. We gave her Tubigrip stockings when she left the clinic 05/21/16; she has not yet had the duplex ultrasound of her thigh, this is booked for Thursday. The wound on the left lateral lower leg is improved 05/28/16; her duplex ultrasound of the left leg specifically her left thigh did not show a DVT however did show hypoechogenic enlarged left inguinal lymph nodes up to 6.9 x 3.7 x 6.8. This also showed in the one in November however maximal diameter was 6.2, at that point I thought these were reactive secondary to cellulitis however there is obviously a differential here. Her wound on the left lower leg is closed. She has a superficial open area on the base of her left fifth metatarsal head on the plantar foot. It looks as though she has some wrap injuries on the anterior leg 06/05/16; since she was last year she had a fall on 1/31. She was seen in the ER but sent back home. I think she is also been seen by her primary physician who picked up on the swelling and the lymphadenopathy. She is ordered a CT scan of the abdomen and pelvis however this  will be without contrast because of stage IIIB renal insufficiency. Nevertheless this should be a start the workup to make sure there is not is more systemic problem here. She will probably need consideration of a biopsy of the lymph node in her inguinal area, this would need general surgery. As far as her wounds are concern today the area on the left plantar foot is healed. She has a small weeping area on the left lower leg. The wrap injury from last week has largely Pardoxically there is a lot less edema in the left thigh. 06/12/16; CT scan is on Friday. She has 2 small open areas one on the lateral left lower leg and one on the anterior lower leg on the left. Both of these looks healthy. I reduced her compression last week to Kerlix and Coban this seems to have not a reasonable job 07/02/16; the patient is now in the skilled facility. I think this was arranged out of the ER when she fell at home although I'm not exactly sure. She is going for I think a laparotomy for the pelvic mass next week. All the wounds in the left leg have healed Electronic Signature(s) Signed: 07/03/2016 8:02:19 AM By: Linton Ham MD Entered By: Linton Ham on 07/02/2016 14:05:46 [name] ([id number]) -------------------------------------------------------------------------------- Physical Exam Details Patient Name: [name] Date of Service: 07/02/2016 1:30 PM Medical Record Patient Account Number: [id number] Number: Treating RN: Montey Hora [birth year] (65 y.o. Other Clinician: Date of Birth/Sex: Female) Treating Ranjit Ashurst Primary Care Provider: LADA, MELINDA Provider/Extender: G Referring Provider: LADA, MELINDA Weeks in Treatment: 32 Constitutional Sitting or standing Blood Pressure is within target range for patient.. Pulse regular and within target range for patient.Marland Kitchen Respirations regular, non-labored and within target range.. Temperature is normal and within the target  range for the patient.. Patient's appearance is neat and clean. Appears in no acute distress. Well nourished and well developed.. Cardiovascular Pedal pulses palpable and strong bilaterally.. Notes Edema is well controlled. There is no open wound on the left leg. We have been using Kerlix and Event organiser) Signed: 07/03/2016 8:02:19 AM By: Linton Ham MD Entered By: Linton Ham on 07/02/2016 14:07:07 [name] ([id number]) -------------------------------------------------------------------------------- Physician Orders Details Patient Name: [name] Date of Service: 07/02/2016 1:30  PM Medical Record Patient Account Number: [id number] Number: Treating RN: Montey Hora [birth year] (65 y.o. Other Clinician: Date of Birth/Sex: Female) Treating Shelise Maron Primary Care Provider: LADA, MELINDA Provider/Extender: G Referring Provider: LADA, MELINDA Weeks in Treatment: 23 Verbal / Phone Orders: No Diagnosis Coding Discharge From [facility] o Discharge from [place] to provide patient with compression hose 20-73mmHg compression. Please continue to wrap left leg with kerlix and coban three times weekly until compression hose are available for both legs to be worn daily from morning until patient goes to bed in the evening. Electronic Signature(s) Signed: 07/02/2016 4:54:24 PM By: Montey Hora Signed: 07/03/2016 8:02:19 AM By: Linton Ham MD Entered By: Montey Hora on 07/02/2016 13:55:47 [name] ([id number]) -------------------------------------------------------------------------------- Problem List Details Patient Name: [name] Date of Service: 07/02/2016 1:30 PM Medical Record Patient Account Number: [id number] Number: Treating RN: Montey Hora 15-Apr-1951 (65 y.o. 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 11/20/2015 Yes exposed L97.512 Non-pressure chronic ulcer of other part of right foot with 11/20/2015 Yes fat layer exposed G35 Multiple sclerosis 11/20/2015 Yes E66.01 Morbid (severe) obesity due to excess calories 11/20/2015 Yes F17.218 Nicotine dependence, cigarettes, with other nicotine- 11/20/2015 Yes induced disorders L04.3 Acute lymphadenitis of lower limb 04/03/2016 Yes Inactive Problems ICD-10 Code Description Active Date Inactive Date L97.222 Non-pressure chronic ulcer of left calf with fat layer 11/20/2015 11/20/2015 exposed Resolved Problems ITZAE, MCCURDY ([id number]) Electronic Signature(s) Signed: 07/03/2016 8:02:19 AM By: Linton Ham MD Entered By: Linton Ham on 07/02/2016 13:58:29 [name] ([id number]) -------------------------------------------------------------------------------- Progress Note Details Patient Name: [name] Date of Service: 07/02/2016 1:30 PM Medical Record Patient Account Number: [id number] Number: Treating RN: Montey Hora 02-09-1951 (65 y.o. Other Clinician: Date of Birth/Sex: Female) Treating Ginni Eichler Primary Care Provider: LADA, MELINDA Provider/Extender: G Referring Provider: LADA, MELINDA Weeks in Treatment: 73 Subjective Chief Complaint Information obtained from Patient returns for evaluation of bilateral lower extremity ulcerations History of Present Illness (HPI) The following HPI elements were documented for the patient's wound: Location: left and right lower extremity and dorsum of the right foot Quality: Patient reports experiencing a sharp pain to affected area(s). Severity: Patient rates the pain to be a 8 out of 10 especially with palpation Duration: Patient has had the  wound for > 6 months prior to seeking treatment at the wound center Timing: Pain in wound is Intermittent in severity but persistent Context: The wound appeared gradually over time Modifying Factors: Other treatment(s) tried include: symptomatic treatment as advised by her PCP Associated Signs and Symptoms: Patient reports having increase swelling iin her bilateral lower extremities 67 year old with a past medical history significant for MS, urinary incontinence, and obesity. She has been seen in the wound clinic before for lower extremity ulcerations treated with compression therapy. she is also known to have hypertension, peripheral vascular disease, COPD, obstructive sleep apnea, lumbar radiculopathy, kyphoscoliosis, urinary issues and tobacco abuse. Smokes a packet of cigarettes a day was recently seen at the [facility] for swelling of her legs and feet with a ulceration on the dorsum of the right foot which has been there for about 6 months. she was recently in the ER about a month ago where she was seen for shortness of  breath and swelling of the legs and a chest x-ray was within normal limits had an increase in her BNP and was given Lasix and put on doxycycline for a mild cellulitis and possible UTI. Wounds aresmaller today 11/13/15. Debrided and will continue Santyl. 12/21/2015 -- she was admitted to the hospital overnight on 12/18/2015 and diagnosed with urinary retention and cellulitis of the left lower leg. is past to take clindamycin and use Santyl for her wounds. 01/15/2016 -- come back to see [place] for almost a month and continues to be noncompliant with her dressings 01/30/16 patient presents today for a follow-up visit concerning her ongoing bilateral lower extremity wounds. We have not seen her for the past 2 weeks although we are supposed to be seen her for weekly visits. She currently has a Foley catheter. She tells me that the wounds are intensely painful especially  with pressure and palpation at this point in time. Fortunately she is having no interval signs or symptoms of systemic infection but unfortunately the wounds have not improved dramatically since we last saw her. She does have home health coming to take care of her as well. She is currently not in any compression wraps. ZIAN, MOHAMED ([id number]) 02/06/16 ON evaluation today patient continues to experience discomfort regarding her bilateral lower extremity ulcers. She has continued to tolerate the dressing changes at this point in time and continues to have a Foley catheter as well. Fortunately she is back this week in the past she has been somewhat noncompliant with follow-ups I'm glad to see her today. She tells me that her pain level varies but can be as high as a 7 out of 10 with manipulation of the wound. She tells me that she used to be on oxycodone which was managed by the pain clinic although she is no longer on that as she tells me that she was actually smoking marijuana at the time and when this was found out they discontinued her pain medication. She no longer is taking anything pain medication wise and she also does not smoke cigarettes nor marijuana at this point. 02/12/16; this is a patient I don't believe I have previously seen. She has multiple sclerosis. She has 4 punched-out areas on the anterior lateral left leg 2 areas on the right these are all in the same condition. Reasonably small [dime size] wounds each was some depth. Surface of these wounds does not look particularly healthy as there is adherent slough. There is no evidence of surrounding infection or inflammation. ABIs in this clinic were 0.87 on the right and 0.81 on the left. She is listed as having PAD and is a smoker. Not a diabetic 02/20/16 patient I gave antibiotics to last week for erythema around both wound sites on the left lateral leg and right lateral leg. This appears to be a lot better. One of the areas  on the left leg is healed however she still has 3 punched-out open areas on the left lateral calf and one on the right lateral calf and one on the dorsal foot. She is an ex-smoker quitting 1 month ago. ABIs in this clinic were 0.87 on the right 0.81 on the left 03/12/16; this is a patient I really don't have a good sense of. It would appear for the first 5 or 6 weeks of her stay in this clinic she was cared for by Dr. Con Memos. She appeared on my schedule in mid October and I saw her twice. She has not been  here however in 3 weeks. She has advanced Wound Care at home where she lives with her husband. She has multiple sclerosis. I saw her the first time she had 4 punched-out small wounds on the anterior lateral left leg it appears that she is now down to 2. She also had wounds on the lateral and medial aspect of her right leg which were also small and punched out. The only one that remains is on the right lateral. Because of the nature of her wound I went ahead and ordered formal arterial studies this showed an ABI of 1 on the right and one on the left. TBI of 1.4 on the right and 0.79 on the left. She had normal waveforms. She was in the emergency room on 02/28/16; there is a noted edema of her left leg after a fall. She had a duplex ultrasound that showed no evidence for an acute DVT from the left groin to the popliteal fossa. The study was limited in the calf veins due to edema. Also noticeable that she had a left inguinal enlarged lymph node up to 6.2 cm. She also had a left knee x-ray that showed no acute findings and a right foot x-ray that showed soft tissue swelling but no radiographic evidence of osteomyelitis. The patient does stop smoking 03-19-16 Ms Magana presents today for evaluation of her bilateral lower extremity ulcerations. She states that she has not smoked in several weeks. She denies any pain or discomfort to bilateral lower extremities, tolerating compression therapy as  ordered. 03/26/16; I have not seen this patient in 2 weeks however she has done very well with improvements in the areas in question. After we obtain normal arterial studies, increasing the 4-layer compression really seems to look done a nice job here. She has one open area on the left lateral leg and one on the medial right leg. These have filled in nicely and are now superficial wounds that showed epithelialized. I note the left inguinal lymph node at 6.2 cm. I may need to refer this back to the patient's primary doctor. 04/03/16; patient has 2 remaining wounds 1 on the left lateral leg and one on the right medial leg. Both of filled in nicely since we are able to increase her from a 3 layer to a 4 layer compression. I am also following up on the lymph node notable on her left leg DVT rule out in November 04/10/16; area on the right lateral leg is healed. The area on the left leg is still open but looks considerably better with healthy granulation and less wound area. I12/20/17; last week we healed the patient doubt with regards to the wound on the right leg to her on compression stocking 20-30 mmHg as it turns out I don't think she had a stocking, predictably therefore she Rood, Itza H. ([id number]) has reopened. The left leg as well as the wound on the lateral aspect. Been generally small line 04/24/16; we healed out her right leg 2 weeks ago to her own 20-30 mm compression stockings although as it turns out she didn't have these and did not purchase some apparently because of financial issues. The left leg wound is on the lateral aspect. Both of these wounds are just about healed. 05/01/16; her wounds on the left leg are healed out today. The area on the right leg is also healed. Vitamin diligent effort of our staff we have not been able to get any form of compression stocking to this patient. The  orders for juxtalite's were given to home health this never materialized. We ordered compression  stockings I don't think she was able to afford these. We had a discussion today with both the patient and her husband without these, will accumulate edema and the wounds will simply reopen again. 05/14/16 READMISSION this is a patient we healed out 2 weeks ago. As bilateral lower extremity venous wounds probably some degree of lymphedema. At the beginning in November I did a duplex ultrasound of her left leg that was negative for DVT but did show a lymph node in the left inguinal area presumably reactive. We've been using compression to her lower extremities eventually healed all her wounds on her bilateral calves but we were not able to get any form of compression stockings for her in spite of intense efforts of our staff. She returns today with reopening on the left leg did not the right she has several superficial areas anteriorly but an actual frank ulcer on the lateral left calf. This is about the size of a dime or smaller. She does not really complain of pain. The patient has a history of PAD however arterial studies we did in November were normal. Her ABIs and Doppler waveforms were both within the normal limits. The patient has a history of MS. In discussing things with her today it would appear that the opening was noted 2 weeks ago according to the patient. We gave her Tubigrip stockings when she left the clinic 05/21/16; she has not yet had the duplex ultrasound of her thigh, this is booked for Thursday. The wound on the left lateral lower leg is improved 05/28/16; her duplex ultrasound of the left leg specifically her left thigh did not show a DVT however did show hypoechogenic enlarged left inguinal lymph nodes up to 6.9 x 3.7 x 6.8. This also showed in the one in November however maximal diameter was 6.2, at that point I thought these were reactive secondary to cellulitis however there is obviously a differential here. Her wound on the left lower leg is closed. She has a superficial  open area on the base of her left fifth metatarsal head on the plantar foot. It looks as though she has some wrap injuries on the anterior leg 06/05/16; since she was last year she had a fall on 1/31. She was seen in the ER but sent back home. I think she is also been seen by her primary physician who picked up on the swelling and the lymphadenopathy. She is ordered a CT scan of the abdomen and pelvis however this will be without contrast because of stage IIIB renal insufficiency. Nevertheless this should be a start the workup to make sure there is not is more systemic problem here. She will probably need consideration of a biopsy of the lymph node in her inguinal area, this would need general surgery. As far as her wounds are concern today the area on the left plantar foot is healed. She has a small weeping area on the left lower leg. The wrap injury from last week has largely Pardoxically there is a lot less edema in the left thigh. 06/12/16; CT scan is on Friday. She has 2 small open areas one on the lateral left lower leg and one on the anterior lower leg on the left. Both of these looks healthy. I reduced her compression last week to Kerlix and Coban this seems to have not a reasonable job 07/02/16; the patient is now in the skilled facility.  I think this was arranged out of the ER when she fell at home although I'm not exactly sure. She is going for I think a laparotomy for the pelvic mass next week. All the wounds in the left leg have healed Objective Guzzo, Sherral H. ([id number]) Constitutional Sitting or standing Blood Pressure is within target range for patient.. Pulse regular and within target range for patient.Marland Kitchen Respirations regular, non-labored and within target range.. Temperature is normal and within the target range for the patient.. Patient's appearance is neat and clean. Appears in no acute distress. Well nourished and well developed.. Vitals Time Taken: 1:40 PM, Height: 63 in,  Weight: 270 lbs, BMI: 47.8, Temperature: 98.2 F, Pulse: 68 bpm, Respiratory Rate: 18 breaths/min, Blood Pressure: 139/63 mmHg. Cardiovascular Pedal pulses palpable and strong bilaterally.. General Notes: Edema is well controlled. There is no open wound on the left leg. We have been using Kerlix and Coban Integumentary (Hair, Skin) Wound #9 status is Healed - Epithelialized. Original cause of wound was Shear/Friction. The wound is located on the Left,Lateral Lower Leg. The wound measures 0cm length x 0cm width x 0cm depth; 0cm^2 area and 0cm^3 volume. The wound is limited to skin breakdown. There is no tunneling or undermining noted. There is a small amount of serosanguineous drainage noted. The wound margin is flat and intact. There is large (67-100%) pink, pale granulation within the wound bed. There is no necrotic tissue within the wound bed. The periwound skin appearance did not exhibit: Callus, Crepitus, Excoriation, Induration, Rash, Scarring, Dry/Scaly, Maceration, Atrophie Blanche, Cyanosis, Ecchymosis, Hemosiderin Staining, Mottled, Pallor, Rubor, Erythema. Periwound temperature was noted as No Abnormality. The periwound has tenderness on palpation. Assessment Active Problems ICD-10 L97.212 - Non-pressure chronic ulcer of right calf with fat layer exposed L97.512 - Non-pressure chronic ulcer of other part of right foot with fat layer exposed G35 - Multiple sclerosis E66.01 - Morbid (severe) obesity due to excess calories F17.218 - Nicotine dependence, cigarettes, with other nicotine-induced disorders L04.3 - Acute lymphadenitis of lower limb Bolin, Felissa Lemmie Evens ([id number]) Plan Discharge From [facility] Services: Discharge from [facility] to provide patient with compression hose 20- 79mmHg compression. Please continue to wrap left leg with kerlix and coban three times weekly until compression hose are available for both legs to be worn daily from morning until  patient goes to bed in the evening. o o #1 the patient can be discharged from clinic #2 I have ordered Kerlix, and coban wrap to the left leg change 3 times a week #3 ordered 20-30 mm below-knee stockings. When these are in they can be applied instead of the Kerlix and Coban line Before the patient is going for I believe an exploratory and biopsy of the pelvic mass. This is likely to be lymphoma Electronic Signature(s) Signed: 07/03/2016 8:02:19 AM By: Linton Ham MD Entered By: Linton Ham on 07/02/2016 14:09:15 [name] ([id number]) -------------------------------------------------------------------------------- SuperBill Details Patient Name: [name] Date of Service: 07/02/2016 Medical Record Patient Account Number: [id number] Number: Treating RN: Montey Hora 03-Sep-1950 (65 y.o. Other Clinician: Date of Birth/Sex: Female) Treating Shaya Reddick Primary Care Provider: LADA, MELINDA Provider/Extender: G Referring Provider: LADA, [place] Service Line: Outpatient Weeks in Treatment: 32 Diagnosis Coding ICD-10 Codes Code Description [phone] Non-pressure chronic ulcer of right calf with fat layer exposed L97.512 Non-pressure chronic ulcer of other part of right foot with fat layer exposed G35 Multiple sclerosis E66.01 Morbid (severe) obesity due to excess calories F17.218 Nicotine dependence, cigarettes,  with other nicotine-induced disorders L04.3 Acute lymphadenitis of lower limb Facility Procedures CPT4 Code: 41583094 Description: [id number] - WOUND CARE VISIT-LEV 2 EST PT Modifier: Quantity: 1 Physician Procedures CPT4 Code: 8811031 Description: 59458 - WC PHYS LEVEL 2 - EST PT ICD-10 Description Diagnosis L97.212 Non-pressure chronic ulcer of right calf with fat Modifier: layer exposed Quantity: 1 Electronic Signature(s) Signed: 07/02/2016 4:35:10 PM By: Montey Hora Signed: 07/03/2016 8:02:19 AM By: Linton Ham MD Entered By: Montey Hora on 07/02/2016 16:35:10

## 2016-07-03 NOTE — Patient Instructions (Signed)
  Your procedure is scheduled on: 07/04/16 Report to Day Surgery. [facility] SECOND FLOOR  AT 11:00 AM   Remember: Instructions that are not followed completely may result in serious medical risk, up to and including death, or upon the discretion of your surgeon and anesthesiologist your surgery may need to be rescheduled.    _X___ 1. Do not eat food or drink liquids after midnight. No gum chewing or hard candies.     ____ 2. No Alcohol for 24 hours before or after surgery.   ____ 3. Do Not Smoke For 24 Hours Prior to Your Surgery.   ____ 4. Bring all medications with you on the day of surgery if instructed.    __X__ 5. Notify your doctor if there is any change in your medical condition     (cold, fever, infections).       Do not wear jewelry, make-up, hairpins, clips or nail polish.  Do not wear lotions, powders, or perfumes. You may wear deodorant.  Do not shave 48 hours prior to surgery. Men may shave face and neck.  Do not bring valuables to the hospital.    [facility] is not responsible for any belongings or valuables.               Contacts, dentures or bridgework may not be worn into surgery.  Leave your suitcase in the car. After surgery it may be brought to your room.  For patients admitted to the hospital, discharge time is determined by your                treatment team.   Patients discharged the day of surgery will not be allowed to drive home.   Please read over the following fact sheets that you were given:   Surgical Site Infection Prevention   __X__ Take these medicines the morning of surgery with A SIP OF WATER:    1.CLONAZEPAM  2. CYMBALTA  3. GABAPENTIN   4. VESICARE  5. TOPAMAX  6. FIND OUT FROM MEDICAL DOCTOR IF OK TO TAKE AMPYRA AM OF SURGERY SINCE GETTING ANESTHESIA OR SHOULD LEAVE OFF UNTIL AFTER SURGERY  ____ Fleet Enema (as directed)   _X___ Use CHG Soap as directed  __X__ Use inhalers on the day of surgery  ____ Stop metformin 2 days prior  to surgery    ____ Take 1/2 of usual insulin dose the night before surgery and none on the morning of surgery.   ____ Stop Coumadin/Plavix/aspirin on  ____ Stop Anti-inflammatories on    ____ Stop supplements until after surgery.    ____ Bring C-Pap to the hospital.

## 2016-07-04 ENCOUNTER — Ambulatory Visit: Payer: Medicare Other | Admitting: Anesthesiology

## 2016-07-04 ENCOUNTER — Ambulatory Visit
Admission: RE | Admit: 2016-07-04 | Discharge: 2016-07-04 | Disposition: A | Discharge status: Home / Self Care | Payer: Medicare Other | Source: Ambulatory Visit | Attending: General Surgery | Admitting: General Surgery

## 2016-07-04 ENCOUNTER — Encounter
Admission: RE | Disposition: A | Discharge status: Home / Self Care | Payer: Self-pay | Source: Ambulatory Visit | Attending: General Surgery

## 2016-07-04 DIAGNOSIS — J449 Chronic obstructive pulmonary disease, unspecified: Secondary | ICD-10-CM | POA: Diagnosis not present

## 2016-07-04 DIAGNOSIS — I10 Essential (primary) hypertension: Secondary | ICD-10-CM | POA: Diagnosis not present

## 2016-07-04 DIAGNOSIS — R590 Localized enlarged lymph nodes: Secondary | ICD-10-CM

## 2016-07-04 DIAGNOSIS — Z833 Family history of diabetes mellitus: Secondary | ICD-10-CM | POA: Diagnosis not present

## 2016-07-04 DIAGNOSIS — Z79899 Other long term (current) drug therapy: Secondary | ICD-10-CM | POA: Diagnosis not present

## 2016-07-04 DIAGNOSIS — Z823 Family history of stroke: Secondary | ICD-10-CM | POA: Insufficient documentation

## 2016-07-04 DIAGNOSIS — Z841 Family history of disorders of kidney and ureter: Secondary | ICD-10-CM | POA: Diagnosis not present

## 2016-07-04 DIAGNOSIS — Z8261 Family history of arthritis: Secondary | ICD-10-CM | POA: Diagnosis not present

## 2016-07-04 DIAGNOSIS — F419 Anxiety disorder, unspecified: Secondary | ICD-10-CM | POA: Insufficient documentation

## 2016-07-04 DIAGNOSIS — Z8249 Family history of ischemic heart disease and other diseases of the circulatory system: Secondary | ICD-10-CM | POA: Insufficient documentation

## 2016-07-04 DIAGNOSIS — Z811 Family history of alcohol abuse and dependence: Secondary | ICD-10-CM | POA: Insufficient documentation

## 2016-07-04 DIAGNOSIS — I739 Peripheral vascular disease, unspecified: Secondary | ICD-10-CM | POA: Diagnosis not present

## 2016-07-04 DIAGNOSIS — M419 Scoliosis, unspecified: Secondary | ICD-10-CM | POA: Insufficient documentation

## 2016-07-04 DIAGNOSIS — C8215 Follicular lymphoma grade II, lymph nodes of inguinal region and lower limb: Secondary | ICD-10-CM | POA: Diagnosis not present

## 2016-07-04 DIAGNOSIS — Z6841 Body Mass Index (BMI) 40.0 and over, adult: Secondary | ICD-10-CM | POA: Insufficient documentation

## 2016-07-04 DIAGNOSIS — Z87891 Personal history of nicotine dependence: Secondary | ICD-10-CM | POA: Diagnosis not present

## 2016-07-04 DIAGNOSIS — Z9889 Other specified postprocedural states: Secondary | ICD-10-CM | POA: Insufficient documentation

## 2016-07-04 DIAGNOSIS — F909 Attention-deficit hyperactivity disorder, unspecified type: Secondary | ICD-10-CM | POA: Insufficient documentation

## 2016-07-04 DIAGNOSIS — F329 Major depressive disorder, single episode, unspecified: Secondary | ICD-10-CM | POA: Insufficient documentation

## 2016-07-04 DIAGNOSIS — Z7951 Long term (current) use of inhaled steroids: Secondary | ICD-10-CM | POA: Insufficient documentation

## 2016-07-04 DIAGNOSIS — G35 Multiple sclerosis: Secondary | ICD-10-CM | POA: Diagnosis not present

## 2016-07-04 DIAGNOSIS — C8205 Follicular lymphoma grade I, lymph nodes of inguinal region and lower limb: Secondary | ICD-10-CM | POA: Diagnosis not present

## 2016-07-04 DIAGNOSIS — Z9851 Tubal ligation status: Secondary | ICD-10-CM | POA: Insufficient documentation

## 2016-07-04 DIAGNOSIS — Z836 Family history of other diseases of the respiratory system: Secondary | ICD-10-CM | POA: Insufficient documentation

## 2016-07-04 HISTORY — PX: INGUINAL LYMPH NODE BIOPSY: SHX5865

## 2016-07-04 LAB — URINE DRUG SCREEN, QUALITATIVE (ARMC ONLY)
Amphetamines, Ur Screen: NOT DETECTED
Barbiturates, Ur Screen: NOT DETECTED
Benzodiazepine, Ur Scrn: NOT DETECTED
Cannabinoid 50 Ng, Ur ~~LOC~~: NOT DETECTED
Cocaine Metabolite,Ur ~~LOC~~: NOT DETECTED
MDMA (Ecstasy)Ur Screen: NOT DETECTED
Methadone Scn, Ur: NOT DETECTED
Opiate, Ur Screen: NOT DETECTED
Phencyclidine (PCP) Ur S: NOT DETECTED
Tricyclic, Ur Screen: POSITIVE — AB

## 2016-07-04 SURGERY — BIOPSY, LYMPH NODE, INGUINAL, OPEN
Anesthesia: Monitor Anesthesia Care | Laterality: Left | Wound class: Clean

## 2016-07-04 MED ORDER — EPHEDRINE SULFATE 50 MG/ML IJ SOLN
INTRAMUSCULAR | Status: DC | PRN
  Administered 2016-07-04 (×2): 10 mg via INTRAVENOUS
  Administered 2016-07-04: 5 mg via INTRAVENOUS

## 2016-07-04 MED ORDER — DEXMEDETOMIDINE HCL IN NACL 200 MCG/50ML IV SOLN
INTRAVENOUS | Status: DC | PRN
  Administered 2016-07-04 (×2): 12 ug via INTRAVENOUS

## 2016-07-04 MED ORDER — LACTATED RINGERS IV SOLN
INTRAVENOUS | Status: DC
  Administered 2016-07-04: 11:00:00 via INTRAVENOUS

## 2016-07-04 MED ORDER — BUPIVACAINE HCL (PF) 0.5 % IJ SOLN
INTRAMUSCULAR | Status: DC | PRN
  Administered 2016-07-04: 17 mL

## 2016-07-04 MED ORDER — GABAPENTIN 600 MG PO TABS: 600.0000 mg | ORAL_TABLET | Freq: Three times a day (TID) | ORAL | 2 refills | Status: DC

## 2016-07-04 MED ORDER — MIDAZOLAM HCL 2 MG/2ML IJ SOLN
INTRAMUSCULAR | Status: AC
  Filled 2016-07-04: qty 2

## 2016-07-04 MED ORDER — CHLORHEXIDINE GLUCONATE CLOTH 2 % EX PADS: 6.0000 | MEDICATED_PAD | Freq: Once | CUTANEOUS | Status: DC

## 2016-07-04 MED ORDER — FENTANYL CITRATE (PF) 100 MCG/2ML IJ SOLN
INTRAMUSCULAR | Status: AC
  Filled 2016-07-04: qty 2

## 2016-07-04 MED ORDER — CEFAZOLIN SODIUM-DEXTROSE 2-4 GM/100ML-% IV SOLN
INTRAVENOUS | Status: AC
Start: 2016-07-04 — End: 2016-07-04
  Administered 2016-07-04: 2 g via INTRAVENOUS
  Filled 2016-07-04: qty 100

## 2016-07-04 MED ORDER — PROPOFOL 10 MG/ML IV BOLUS
INTRAVENOUS | Status: DC | PRN
  Administered 2016-07-04: 30 mg via INTRAVENOUS

## 2016-07-04 MED ORDER — PROPOFOL 500 MG/50ML IV EMUL
INTRAVENOUS | Status: AC
  Filled 2016-07-04: qty 50

## 2016-07-04 MED ORDER — LIDOCAINE HCL (PF) 1 % IJ SOLN
INTRAMUSCULAR | Status: AC
  Filled 2016-07-04: qty 30

## 2016-07-04 MED ORDER — PHENYLEPHRINE HCL 10 MG/ML IJ SOLN
INTRAMUSCULAR | Status: DC | PRN
  Administered 2016-07-04: 30 ug/min via INTRAVENOUS

## 2016-07-04 MED ORDER — ONDANSETRON HCL 4 MG/2ML IJ SOLN
INTRAMUSCULAR | Status: DC | PRN
  Administered 2016-07-04: 4 mg via INTRAVENOUS

## 2016-07-04 MED ORDER — MIDAZOLAM HCL 2 MG/2ML IJ SOLN
INTRAMUSCULAR | Status: DC | PRN
Start: 2016-07-04 — End: 2016-07-04
  Administered 2016-07-04 (×2): 1 mg via INTRAVENOUS

## 2016-07-04 MED ORDER — LIDOCAINE HCL (PF) 1 % IJ SOLN
INTRAMUSCULAR | Status: DC | PRN
  Administered 2016-07-04: 17 mL

## 2016-07-04 MED ORDER — PROPOFOL 10 MG/ML IV BOLUS
INTRAVENOUS | Status: AC
  Filled 2016-07-04: qty 20

## 2016-07-04 MED ORDER — FENTANYL CITRATE (PF) 100 MCG/2ML IJ SOLN: 25.0000 ug | INTRAMUSCULAR | Status: DC | PRN

## 2016-07-04 MED ORDER — ONDANSETRON HCL 4 MG/2ML IJ SOLN: 4.0000 mg | Freq: Once | INTRAMUSCULAR | Status: DC | PRN

## 2016-07-04 MED ORDER — FENTANYL CITRATE (PF) 100 MCG/2ML IJ SOLN
INTRAMUSCULAR | Status: DC | PRN
  Administered 2016-07-04: 50 ug via INTRAVENOUS
  Administered 2016-07-04 (×2): 25 ug via INTRAVENOUS

## 2016-07-04 MED ORDER — PROPOFOL 500 MG/50ML IV EMUL
INTRAVENOUS | Status: DC | PRN
  Administered 2016-07-04: 40 ug/kg/min via INTRAVENOUS

## 2016-07-04 MED ORDER — BUPIVACAINE HCL (PF) 0.5 % IJ SOLN
INTRAMUSCULAR | Status: AC
  Filled 2016-07-04: qty 30

## 2016-07-04 SURGICAL SUPPLY — 32 items
BAG URINE DRAINAGE (UROLOGICAL SUPPLIES) ×2 IMPLANT
BLADE CLIPPER SURG (BLADE) ×2 IMPLANT
BLADE SURG 15 STRL SS SAFETY (BLADE) ×2 IMPLANT
CANISTER SUCT 1200ML W/VALVE (MISCELLANEOUS) ×2 IMPLANT
CHLORAPREP W/TINT 26ML (MISCELLANEOUS) ×2 IMPLANT
CNTNR SPEC 2.5X3XGRAD LEK (MISCELLANEOUS) ×1
CONT SPEC 4OZ STER OR WHT (MISCELLANEOUS) ×1
CONTAINER SPEC 2.5X3XGRAD LEK (MISCELLANEOUS) ×1 IMPLANT
COVER PROBE FLX POLY STRL (MISCELLANEOUS) ×2 IMPLANT
DECANTER SPIKE VIAL GLASS SM (MISCELLANEOUS) ×2 IMPLANT
DERMABOND ADVANCED (GAUZE/BANDAGES/DRESSINGS) ×1
DERMABOND ADVANCED .7 DNX12 (GAUZE/BANDAGES/DRESSINGS) ×1 IMPLANT
DRAPE LAPAROTOMY 100X77 ABD (DRAPES) ×2 IMPLANT
DRESSING TELFA 4X3 1S ST N-ADH (GAUZE/BANDAGES/DRESSINGS) ×2 IMPLANT
ELECT REM PT RETURN 9FT ADLT (ELECTROSURGICAL) ×2
ELECTRODE REM PT RTRN 9FT ADLT (ELECTROSURGICAL) ×1 IMPLANT
GOWN STRL REUS W/ TWL LRG LVL3 (GOWN DISPOSABLE) ×2 IMPLANT
GOWN STRL REUS W/TWL LRG LVL3 (GOWN DISPOSABLE) ×2
HEMOSTAT SURGICEL 2X3 (HEMOSTASIS) ×2 IMPLANT
HOLDER DRSG NASAL ADJUST (MISCELLANEOUS) ×4 IMPLANT
KIT RM TURNOVER STRD PROC AR (KITS) ×2 IMPLANT
LABEL OR SOLS (LABEL) ×2 IMPLANT
NEEDLE HYPO 25X1 1.5 SAFETY (NEEDLE) ×2 IMPLANT
NS IRRIG 500ML POUR BTL (IV SOLUTION) ×2 IMPLANT
PACK BASIN MINOR ARMC (MISCELLANEOUS) ×2 IMPLANT
SPONGE LAP 18X36 2PK (MISCELLANEOUS) ×2 IMPLANT
SUT ETHILON 5-0 FS-2 18 BLK (SUTURE) ×2 IMPLANT
SUT SILK 4 0 (SUTURE) ×1
SUT SILK 4-0 18XBRD TIE 12 (SUTURE) ×1 IMPLANT
SUT VIC AB 3-0 SH 27 (SUTURE) ×1
SUT VIC AB 3-0 SH 27X BRD (SUTURE) ×1 IMPLANT
SYR BULB IRRIG 60ML STRL (SYRINGE) ×2 IMPLANT

## 2016-07-04 NOTE — H&P (View-Only) (Signed)
Patient ID: [name], female   DOB: 09/19/1950, 66 y.o.   MRN: [id number]  Chief Complaint  Patient presents with  . Other    enlarged lymph node    HPI [name] is a 65 y.o. female here for evaluation for enlarged lymph node of the left groin area. She developed left leg swelling which prompted an [place]- No DVT noted but very large left inguinal nodes were seen. Subsequent CT shows markedly enlarged left Iliac and inguinal nodes. Biopsy requested for this. Pt has MS and has recently moved to an assisted care facilty. Has a chronic indwelling foley .  I have reviewed the history of present illness with the patient.   HPI  Past Medical History:  Diagnosis Date  . ADHD   . Anxiety   . COPD (chronic obstructive pulmonary disease) ([place])   . Depression    major depressive  . Dyspnea    doe  . Edema    left leg  . Hypertension   . Hypotension    idiopathic  . Kyphoscoliosis and scoliosis 11/26/2011  . Morbid obesity ([place]) 01/05/2016  . Multiple sclerosis ([place])   . Neuromuscular disorder ([place])   . Obstructive and reflux uropathy    foley  . Pain    atypical facial  . Peripheral vascular disease of lower extremity with ulceration ([place]) 11/08/2015  . Skin ulcer ([place]) 11/08/2015  . Weakness    generalized. has MS    Past Surgical History:  Procedure Laterality Date  . BACK SURGERY N/A 2002  . CYST EXCISION     lower back  . TONSILLECTOMY AND ADENOIDECTOMY    . TUBAL LIGATION      Family History  Problem Relation Age of Onset  . COPD Mother   . Diabetes Mother   . Heart failure Mother   . Alcohol abuse Father   . Kidney disease Father   . Kidney failure Father   . Arthritis Sister   . CAD Maternal Grandmother   . Stroke Maternal Grandfather   . Diabetes Sister     Social History Social History  Substance Use Topics  . Smoking status: Former Smoker    Types: Cigarettes    Quit date: 02/03/2016  . Smokeless tobacco: Never Used  . Alcohol use 0.0 oz/week   Comment: Pt states occasionally (liquor)    No Known Allergies  No current facility-administered medications for this visit.    No current outpatient prescriptions on file.   Facility-Administered Medications Ordered in Other Visits  Medication Dose Route Frequency Provider Last Rate Last Dose  . ceFAZolin (ANCEF) 2-4 GM/100ML-% IVPB           . ceFAZolin (ANCEF) IVPB 2g/100 mL premix  2 g Intravenous On Call to OR Onesimo Lingard Robinette Haines, MD      . Chlorhexidine Gluconate Cloth 2 % PADS 6 each  6 each Topical Once Dariyon Urquilla Robinette Haines, MD      . lactated ringers infusion   Intravenous Continuous Gunnar Bulla, MD 75 mL/hr at 07/04/16 1124      Review of Systems Review of Systems  Respiratory: Negative.   Cardiovascular: Negative.   Gastrointestinal: Negative.   Genitourinary: Positive for difficulty urinating.  Neurological: Positive for weakness and numbness.    Blood pressure 122/66, pulse 76, resp. rate 14, height 5\' 3"  (1.6 m), weight 284 lb (128.8 kg).  Physical Exam Physical Exam  Constitutional: She is oriented to person, place, and time. She appears well-developed and  well-nourished.  Pt in wheel chair, very slow with movement, did manage to stand and sit on exam table.  Eyes: Conjunctivae are normal. No scleral icterus.  Neck: Neck supple.  Cardiovascular: Normal rate and regular rhythm.   Pulmonary/Chest: Effort normal and breath sounds normal.  Abdominal: Soft. Bowel sounds are normal.  Lymphadenopathy:    She has no cervical adenopathy.    She has no axillary adenopathy.       Right: No inguinal adenopathy present.       Left: Inguinal adenopathy present.  Hard ill defined mass left groin  Neurological: She is alert and oriented to person, place, and time.  Skin: Skin is warm and dry.    Data Reviewed CT scan reviewed  Assessment    Suspicious mass/lymph nodes- suspect lymphoma.    Plan   Given her overall condition would prefer to biopsy left  inguinal node in [facility] with monitored anesthesia care Patient's surgery has been scheduled for 07-04-16 at [facility].     This has been scribed by Lesly Rubenstein LPN    Junie Panning G 07/04/2016, 12:40 PM

## 2016-07-04 NOTE — Interval H&P Note (Signed)
History and Physical Interval Note:  07/04/2016 12:40 PM  [name]  has presented today for surgery, with the diagnosis of LYMPHADENOPATHY  The various methods of treatment have been discussed with the patient and family. After consideration of risks, benefits and other options for treatment, the patient has consented to  Procedure(s): INGUINAL LYMPH NODE BIOPSY (Left) as a surgical intervention .  The patient's history has been reviewed, patient examined, no change in status, stable for surgery.  I have reviewed the patient's chart and labs.  Questions were answered to the patient's satisfaction.     Dwayn Moravek G

## 2016-07-04 NOTE — Anesthesia Post-op Follow-up Note (Cosign Needed)
Anesthesia QCDR form completed.        

## 2016-07-04 NOTE — Op Note (Signed)
Preop diagnosis: Massive lymphadenopathy left iliac and inguinal region  Post op diagnosis: Same, suspected lymphoma  Operation: Excision biopsy left inguinal node, US guidance  Surgeon: Mckinley Jewel R  Assistant:     Anesthesia: MAC  Complications: None  EBL: Less than 5 mL  Drains: None  Description: Patient was placed supine on the operating table. The left inguinal region was prepped and draped sterile field. Timeout was performed. In the left inguinal region there was a firm immobile mass located in the mid inguinal region with the smaller one noted lateral to this. Ultrasound confirmed the presence of a large 5-6 cm sized hypoechoic mass consistent with an abnormal lymph node and lateral to this another fairly large similar lymph node. Skin incision was mapped overlying the larger of the 2 nodes. This area was infiltrated with the proximally 25th 5 mL of 0.5% Marcaine mixed with 1% Xylocaine. Skin incision was made and deepened through the layers with cautery and ligatures of 3-0 Vicryl. The involved lymph node was then identified and freed to allow for an adequate exposure over his anterior surface. Since the lymph node was extremely large with decided not attempt complete resection. A large wedge of tissue was excised out with the use of cautery. In some additional tissue also obtained for any subsequent pathologic evaluation. Touch preps were obtained showing that this hasn't was likely a lipoma and further workup will be completed for this. Cautery was used to control bleeding from the raw surface of the lymph node and this was subsequently covered with a small piece of Surgicel   to minimize any further bleeding. Wound was irrigated in the deeper tissues closed in 2 layers of 3-0 Vicryl and the skin closed with subcuticular 4-0 Vicryl covered with Dermabond. Procedure was well-tolerated and she was subsequently returned recovery room in stable condition.

## 2016-07-04 NOTE — Anesthesia Preprocedure Evaluation (Signed)
Anesthesia Evaluation  Patient identified by MRN, date of birth, ID band Patient awake    Reviewed: Allergy & Precautions, H&P , NPO status , Patient's Chart, lab work & pertinent test results, reviewed documented beta blocker date and time   Airway Mallampati: II  TM Distance: >3 FB Neck ROM: full    Dental no notable dental hx. (+) Teeth Intact   Pulmonary neg pulmonary ROS, shortness of breath, sleep apnea , COPD, former smoker,    Pulmonary exam normal breath sounds clear to auscultation       Cardiovascular Exercise Tolerance: Good hypertension, + Peripheral Vascular Disease  negative cardio ROS   Rhythm:regular Rate:Normal     Neuro/Psych  Headaches, PSYCHIATRIC DISORDERS Anxiety Depression  Neuromuscular disease negative neurological ROS  negative psych ROS   GI/Hepatic negative GI ROS, Neg liver ROS,   Endo/Other  negative endocrine ROSdiabetes  Renal/GU Renal disease     Musculoskeletal   Abdominal   Peds  Hematology negative hematology ROS (+)   Anesthesia Other Findings   Reproductive/Obstetrics negative OB ROS                             Anesthesia Physical Anesthesia Plan  ASA: III  Anesthesia Plan: MAC   Post-op Pain Management:    Induction:   Airway Management Planned:   Additional Equipment:   Intra-op Plan:   Post-operative Plan:   Informed Consent: I have reviewed the patients History and Physical, chart, labs and discussed the procedure including the risks, benefits and alternatives for the proposed anesthesia with the patient or authorized representative who has indicated his/her understanding and acceptance.     Plan Discussed with: CRNA  Anesthesia Plan Comments:         Anesthesia Quick Evaluation

## 2016-07-04 NOTE — OR Nursing (Signed)
Arrived to [facility] from [facility].  Urinary Catheter in place upon arrival.  Urine draining is yellow and milky.  Dr Jamal Collin made aware.  Also brought it to the attention of the CNA from [facility] ([place]).

## 2016-07-04 NOTE — Transfer of Care (Addendum)
Immediate Anesthesia Transfer of Care Note  Patient: [name]  Procedure(s) Performed: Procedure(s): INGUINAL LYMPH NODE BIOPSY (Left)  Patient Location: PACU  Anesthesia Type:MAC  Level of Consciousness: awake, alert , oriented and patient cooperative  Airway & Oxygen Therapy: Patient Spontanous Breathing  Post-op Assessment: Report given to RN, Post -op Vital signs reviewed and stable and Patient moving all extremities  Post vital signs: Reviewed and stable  Last Vitals:  Vitals:   07/04/16 1109 07/04/16 1427  BP:  (!) 101/56  Pulse:  61  Resp:  13  Temp: 36.2 C 36.4 C    Last Pain:  Vitals:   07/04/16 1102  TempSrc: Temporal         Complications: No apparent anesthesia complications

## 2016-07-04 NOTE — Discharge Instructions (Signed)

## 2016-07-05 ENCOUNTER — Ambulatory Visit: Payer: Medicare Other | Admitting: Oncology

## 2016-07-05 ENCOUNTER — Encounter: Payer: Self-pay | Admitting: General Surgery

## 2016-07-05 NOTE — Anesthesia Postprocedure Evaluation (Signed)
Anesthesia Post Note  Patient: DOMINI VANDEHEI  Procedure(s) Performed: Procedure(s) (LRB): INGUINAL LYMPH NODE BIOPSY (Left)  Patient location during evaluation: PACU Anesthesia Type: MAC Level of consciousness: awake and alert Pain management: pain level controlled Vital Signs Assessment: post-procedure vital signs reviewed and stable Respiratory status: spontaneous breathing, nonlabored ventilation, respiratory function stable and patient connected to nasal cannula oxygen Cardiovascular status: blood pressure returned to baseline and stable Postop Assessment: no signs of nausea or vomiting Anesthetic complications: no     Last Vitals:  Vitals:   07/04/16 1457 07/04/16 1500  BP:  126/73  Pulse: (!) 55 (!) 57  Resp: 10   Temp: 36.6 C 36.2 C    Last Pain:  Vitals:   07/05/16 0901  TempSrc:   PainSc: 0-No pain                 Molli Barrows

## 2016-07-09 ENCOUNTER — Encounter: Payer: Self-pay | Admitting: Oncology

## 2016-07-09 ENCOUNTER — Inpatient Hospital Stay: Payer: Medicare Other

## 2016-07-09 ENCOUNTER — Inpatient Hospital Stay: Payer: Medicare Other | Attending: Oncology | Admitting: Oncology

## 2016-07-09 VITALS — BP 121/71 | HR 68 | Temp 96.7°F | Resp 20 | Ht 63.0 in | Wt 312.1 lb

## 2016-07-09 DIAGNOSIS — R590 Localized enlarged lymph nodes: Secondary | ICD-10-CM | POA: Diagnosis not present

## 2016-07-09 DIAGNOSIS — F419 Anxiety disorder, unspecified: Secondary | ICD-10-CM | POA: Insufficient documentation

## 2016-07-09 DIAGNOSIS — I739 Peripheral vascular disease, unspecified: Secondary | ICD-10-CM | POA: Diagnosis not present

## 2016-07-09 DIAGNOSIS — I700 Atherosclerosis of aorta: Secondary | ICD-10-CM | POA: Diagnosis not present

## 2016-07-09 DIAGNOSIS — Z9181 History of falling: Secondary | ICD-10-CM

## 2016-07-09 DIAGNOSIS — M5416 Radiculopathy, lumbar region: Secondary | ICD-10-CM | POA: Diagnosis not present

## 2016-07-09 DIAGNOSIS — M419 Scoliosis, unspecified: Secondary | ICD-10-CM | POA: Insufficient documentation

## 2016-07-09 DIAGNOSIS — N183 Chronic kidney disease, stage 3 (moderate): Secondary | ICD-10-CM | POA: Insufficient documentation

## 2016-07-09 DIAGNOSIS — G834 Cauda equina syndrome: Secondary | ICD-10-CM | POA: Diagnosis not present

## 2016-07-09 DIAGNOSIS — Z87891 Personal history of nicotine dependence: Secondary | ICD-10-CM | POA: Insufficient documentation

## 2016-07-09 DIAGNOSIS — M7989 Other specified soft tissue disorders: Secondary | ICD-10-CM

## 2016-07-09 DIAGNOSIS — Z8669 Personal history of other diseases of the nervous system and sense organs: Secondary | ICD-10-CM | POA: Insufficient documentation

## 2016-07-09 DIAGNOSIS — J449 Chronic obstructive pulmonary disease, unspecified: Secondary | ICD-10-CM | POA: Insufficient documentation

## 2016-07-09 DIAGNOSIS — Z79899 Other long term (current) drug therapy: Secondary | ICD-10-CM | POA: Diagnosis not present

## 2016-07-09 DIAGNOSIS — Z8619 Personal history of other infectious and parasitic diseases: Secondary | ICD-10-CM | POA: Diagnosis not present

## 2016-07-09 DIAGNOSIS — G4730 Sleep apnea, unspecified: Secondary | ICD-10-CM | POA: Diagnosis not present

## 2016-07-09 DIAGNOSIS — R1030 Lower abdominal pain, unspecified: Secondary | ICD-10-CM | POA: Diagnosis not present

## 2016-07-09 DIAGNOSIS — C8580 Other specified types of non-Hodgkin lymphoma, unspecified site: Secondary | ICD-10-CM

## 2016-07-09 DIAGNOSIS — I129 Hypertensive chronic kidney disease with stage 1 through stage 4 chronic kidney disease, or unspecified chronic kidney disease: Secondary | ICD-10-CM | POA: Diagnosis not present

## 2016-07-09 DIAGNOSIS — C8335 Diffuse large B-cell lymphoma, lymph nodes of inguinal region and lower limb: Secondary | ICD-10-CM

## 2016-07-09 DIAGNOSIS — G35 Multiple sclerosis: Secondary | ICD-10-CM | POA: Insufficient documentation

## 2016-07-09 DIAGNOSIS — F909 Attention-deficit hyperactivity disorder, unspecified type: Secondary | ICD-10-CM | POA: Insufficient documentation

## 2016-07-09 DIAGNOSIS — E669 Obesity, unspecified: Secondary | ICD-10-CM

## 2016-07-09 DIAGNOSIS — F329 Major depressive disorder, single episode, unspecified: Secondary | ICD-10-CM | POA: Insufficient documentation

## 2016-07-09 LAB — COMPREHENSIVE METABOLIC PANEL
ALT: 14 U/L (ref 14–54)
AST: 24 U/L (ref 15–41)
Albumin: 4 g/dL (ref 3.5–5.0)
Alkaline Phosphatase: 83 U/L (ref 38–126)
Anion gap: 10 (ref 5–15)
BUN: 24 mg/dL — ABNORMAL HIGH (ref 6–20)
CO2: 26 mmol/L (ref 22–32)
Calcium: 9.5 mg/dL (ref 8.9–10.3)
Chloride: 93 mmol/L — ABNORMAL LOW (ref 101–111)
Creatinine, Ser: 1.47 mg/dL — ABNORMAL HIGH (ref 0.44–1.00)
GFR calc Af Amer: 42 mL/min — ABNORMAL LOW (ref >60)
GFR calc non Af Amer: 36 mL/min — ABNORMAL LOW (ref >60)
Glucose, Bld: 93 mg/dL (ref 65–99)
Potassium: 3.3 mmol/L — ABNORMAL LOW (ref 3.5–5.1)
Sodium: 129 mmol/L — ABNORMAL LOW (ref 135–145)
Total Bilirubin: 0.3 mg/dL (ref 0.3–1.2)
Total Protein: 7.9 g/dL (ref 6.5–8.1)

## 2016-07-09 LAB — CBC WITH DIFFERENTIAL/PLATELET
Basophils Absolute: 0.1 10*3/uL (ref 0–0.1)
Basophils Relative: 1 %
Eosinophils Absolute: 0.2 10*3/uL (ref 0–0.7)
Eosinophils Relative: 4 %
HCT: 38 % (ref 35.0–47.0)
Hemoglobin: 13.2 g/dL (ref 12.0–16.0)
Lymphocytes Relative: 13 %
Lymphs Abs: 0.9 10*3/uL — ABNORMAL LOW (ref 1.0–3.6)
MCH: 28.5 pg (ref 26.0–34.0)
MCHC: 34.8 g/dL (ref 32.0–36.0)
MCV: 81.9 fL (ref 80.0–100.0)
Monocytes Absolute: 0.5 10*3/uL (ref 0.2–0.9)
Monocytes Relative: 8 %
Neutro Abs: 5.3 10*3/uL (ref 1.4–6.5)
Neutrophils Relative %: 74 %
Platelets: 278 10*3/uL (ref 150–440)
RBC: 4.64 MIL/uL (ref 3.80–5.20)
RDW: 14.4 % (ref 11.5–14.5)
WBC: 7.1 10*3/uL (ref 3.6–11.0)

## 2016-07-09 LAB — LACTATE DEHYDROGENASE: LDH: 148 U/L (ref 98–192)

## 2016-07-09 LAB — URIC ACID: Uric Acid, Serum: 8.1 mg/dL — ABNORMAL HIGH (ref 2.3–6.6)

## 2016-07-09 NOTE — Progress Notes (Signed)
Hematology/Oncology Consult note [facility] Telephone:([phone] [phone]  Patient Care Team: Arnetha Courser, MD as PCP - General (Family Medicine) Arnetha Courser, MD as Attending Physician (Family Medicine) Christene Lye, MD (General Surgery)   Name of the patient: [name]  [id number]  20-Sep-1950    Reason for referral- inguinal adenopathy   Referring physician- Dr. Sanda [name]  Date of visit: 07/09/16   History of presenting illness- 1. patient is a 66 year old female who has had ongoing problems with pain in her left groin as well as his left leg swelling.   2. She had a Doppler of her left lower extremity on 02/28/2016 which showed: 1. Mild to moderate edema within the left calf. No evidence for acute DVT from left groin to popliteal fossa. Slightly limited evaluation of calf veins due to edema. 2. Left inguinal enlarged lymph nodes up to 6.2 cm, findings could be secondary to reactive node, infection, inflammation, or lymphoproliferative disease.  3. She had a repeat Doppler on 05/24/2016 which showed: IMPRESSION: 1. No evidence of  lower extremity deep vein thrombosis, left. 2. Left inguinal lymphadenopathy. Differential diagnosis: Reactive adenopathy, lymphoma, metastatic disease.  4. CT abdomen on 06/14/2016 showed: FINDINGS: Lower chest: No acute abnormality.  Hepatobiliary: No focal liver abnormality is seen. No gallstones, gallbladder wall thickening, or biliary dilatation.  Pancreas: Unremarkable. No pancreatic ductal dilatation or surrounding inflammatory changes.  Spleen: Normal in size without focal abnormality.  Adrenals/Urinary Tract: The adrenal glands are within normal limits bilaterally. Fullness of the collecting system is is noted bilaterally without definitive obstructing stone. The bladder is decompressed by Foley catheter.  Stomach/Bowel: Fecal material is noted throughout the colon.  No obstructive changes are seen. The appendix is within normal limits without inflammatory changes.  Vascular/Lymphatic: Aortic calcifications are noted without aneurysmal dilatation. Considerable lymphadenopathy is noted in the left inguinal region. The largest of these measures 6.1 by 3.0 cm in greatest dimension. This is similar to that seen on the prior ultrasound examination and remains suspicious. Additionally a lymphomatous mass is noted within the left hemipelvis which measures approximately 10 by 4.5 cm. Scattered left iliac and periaortic lymph nodes are seen. The periaortic lymph nodes are not significantly enlarged. The other left iliac chain nodes measure upwards of 18 mm.  Reproductive: The uterus is within normal limits. Cystic change in the left ovary is noted. This is of uncertain chronicity  Other: No free pelvic fluid is noted.  Musculoskeletal: Degenerative changes of the lumbar spine are seen. Postsurgical changes are noted.  IMPRESSION: Lymphadenopathy within the left inguinal region and the left iliac chain. This is highly suspicious for underlying neoplasm. Biopsy is recommended for further evaluation.  5. Patient was seen by Dr. Jamal [name] from surgery and underwent excisional biopsy of the left inguinal lymph node under ultrasound on 07/04/2016. Final pathology not reported yet. Dr. Luana [name] told me over the phone that this appears to be CD 20+ B cell lymphoma  6. Patient has multiple sclerosis and has h/o depression and anxiety. She was given clonopin this morning and currently patient is drowsy but able to be aroused. She drifts off to sleep in between conversation. She is here with her friend who helps her out. Patient lives with her husband at baseline and uses a walker to ambulate. Reports pain and discomfort in her left groin. Denies fevers, chills, night sweats or weight loss    ECOG PS- 2  Pain scale- 0   Review of systems-  Review of Systems   Constitutional: Positive for malaise/fatigue. Negative for chills, fever and weight loss.  HENT: Negative for congestion, ear discharge and nosebleeds.   Eyes: Negative for blurred vision.  Respiratory: Negative for cough, hemoptysis, sputum production, shortness of breath and wheezing.   Cardiovascular: Negative for chest pain, palpitations, orthopnea and claudication.  Gastrointestinal: Negative for abdominal pain, blood in stool, constipation, diarrhea, heartburn, melena, nausea and vomiting.  Genitourinary: Negative for dysuria, flank pain, frequency, hematuria and urgency.  Musculoskeletal: Negative for back pain, joint pain and myalgias.  Skin: Negative for rash.  Neurological: Negative for dizziness, tingling, focal weakness, seizures, weakness and headaches.  Endo/Heme/Allergies: Does not bruise/bleed easily.  Psychiatric/Behavioral: Negative for depression and suicidal ideas. The patient does not have insomnia.     No Known Allergies  Patient Active Problem List   Diagnosis Date Noted  . Pelvic mass in female 06/18/2016  . Multiple falls 06/07/2016  . Inguinal adenopathy 05/29/2016  . Swelling of nose 01/07/2016  . Foreign body sensation in left ear canal 01/07/2016  . Breast cancer screening 01/05/2016  . Morbid obesity ([place]) 01/05/2016  . Pressure ulcer 12/19/2015  . Low HDL (under 40) 12/19/2015  . Cellulitis 12/18/2015  . Skin ulcer ([place]) 11/08/2015  . Peripheral vascular disease of lower extremity with ulceration ([place]) 11/08/2015  . CKD (chronic kidney disease) stage 3, GFR 30-59 ml/min 11/08/2015  . Decubitus ulcer of left thigh, stage 2 04/18/2015  . Constipation due to pain medication 01/31/2015  . Obstructive sleep apnea of adult 01/13/2015  . Pelvic muscle wasting 01/13/2015  . Incomplete bladder emptying 01/13/2015  . Headache, migraine 10/24/2014  . 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 Current tobacco use 10/24/2014  . Lumbar radiculopathy, chronic  10/02/2013  . COPD with bronchial hyperresponsiveness ([place]) 10/02/2013  . Major depressive disorder, recurrent, in partial remission ([place]) 10/02/2013  . Hypertension goal BP (blood pressure) < 140/90 10/02/2013  . Multiple sclerosis ([place]) 10/02/2013  . Absence of bladder continence 09/25/2012  . Acontractile bladder 02/12/2012  . Bladder retention 02/12/2012  . Narrowing of intervertebral disc space 11/26/2011  . Kyphoscoliosis and scoliosis 11/26/2011     Past Medical History:  Diagnosis Date  . ADHD   . Anxiety   . COPD (chronic obstructive pulmonary disease) ([place])   . Depression    major depressive  . Dyspnea    doe  . Edema    left leg  . Hypertension   . Hypotension    idiopathic  . Kyphoscoliosis and scoliosis 11/26/2011  . Morbid obesity ([place]) 01/05/2016  . Multiple sclerosis ([place])   . Multiple sclerosis ([place])    1980's  . Neuromuscular disorder ([place])   . Obstructive and reflux uropathy    foley  . Pain    atypical facial  . Peripheral vascular disease of lower extremity with ulceration ([place]) 11/08/2015  . Skin ulcer ([place]) 11/08/2015  . Weakness    generalized. has MS     Past Surgical History:  Procedure Laterality Date  . BACK SURGERY N/A 2002  . CYST EXCISION     lower back  . INGUINAL LYMPH NODE BIOPSY Left 07/04/2016   Procedure: INGUINAL LYMPH NODE BIOPSY;  Surgeon: Christene Lye, MD;  Location: [facility] ORS;  Service: General;  Laterality: Left;  . TONSILLECTOMY AND ADENOIDECTOMY    . TUBAL LIGATION      Social History   Social History  . Marital status: Married    Spouse name: N/A  . Number of children:  3  . Years of education: N/A   Occupational History  . disabled    Social History Main Topics  . Smoking status: Former Smoker    Packs/day: 1.00    Years: 20.00    Types: Cigarettes    Quit date: 02/03/2016  . Smokeless tobacco: Never Used  . Alcohol use No  . Drug use: Yes    Types: Marijuana     Comment: smokes THC daily per pt    . Sexual activity: No   Other Topics Concern  . Not on file   Social History Narrative  . No narrative on file     Family History  Problem Relation Age of Onset  . COPD Mother   . Diabetes Mother   . Heart failure Mother   . Alcohol abuse Father   . Kidney disease Father   . Kidney failure Father   . Arthritis Sister   . CAD Maternal Grandmother   . Stroke Maternal Grandfather   . Diabetes Sister   . Arthritis Sister   . Arthritis Brother      Current Outpatient Prescriptions:  .  clonazePAM (KLONOPIN) 1 MG tablet, Take 1 tablet by mouth 2 (two) times daily as needed., Disp: , Rfl:  .  Dalfampridine (AMPYRA PO), Take by mouth daily., Disp: , Rfl:  .  DULoxetine (CYMBALTA) 60 MG capsule, Take 60 mg by mouth daily. , Disp: , Rfl:  .  fluticasone furoate-vilanterol (BREO ELLIPTA) 100-25 MCG/INH AEPB, Inhale 1 puff into the lungs daily., Disp: 1 each, Rfl: 11 .  gabapentin (NEURONTIN) 600 MG tablet, Take 1 tablet (600 mg total) by mouth 3 (three) times daily., Disp: 90 tablet, Rfl: 2 .  methylphenidate (RITALIN) 10 MG tablet, Take 10 mg by mouth 2 (two) times daily. , Disp: , Rfl: 0 .  Multiple Vitamin (MULTIVITAMIN WITH MINERALS) TABS tablet, Take 1 tablet by mouth daily., Disp: , Rfl:  .  polyethylene glycol powder (GLYCOLAX/MIRALAX) powder, Take 17 g by mouth daily. Mixed in water (Patient taking differently: Take 17 g by mouth as needed. Mixed in water), Disp: 850 g, Rfl: 2 .  potassium chloride (K-DUR) 10 MEQ tablet, Take 1 tablet (10 mEq total) by mouth daily., Disp: 90 tablet, Rfl: 1 .  QUEtiapine Fumarate (SEROQUEL XR) 150 MG 24 hr tablet, Take 150 mg by mouth at bedtime. , Disp: , Rfl:  .  solifenacin (VESICARE) 5 MG tablet, Take 5 mg by mouth daily., Disp: , Rfl:  .  tiotropium (SPIRIVA) 18 MCG inhalation capsule, Place 18 mcg into inhaler and inhale daily., Disp: , Rfl:  .  tiZANidine (ZANAFLEX) 4 MG tablet, Take 4 mg by mouth 4 (four) times daily. , Disp: , Rfl:  .   topiramate (TOPAMAX) 50 MG tablet, Take 1 tablet (50 mg total) by mouth 2 (two) times daily., Disp: 60 tablet, Rfl: 2 .  traMADol (ULTRAM) 50 MG tablet, Take 1 tablet (50 mg total) by mouth every 6 (six) hours as needed. (Patient taking differently: Take 50 mg by mouth every 6 (six) hours as needed (for pain). ), Disp: 20 tablet, Rfl: 0 .  triamterene-hydrochlorothiazide (MAXZIDE) 75-50 MG tablet, Take 1 tablet by mouth daily., Disp: , Rfl:  .  buPROPion (WELLBUTRIN XL) 300 MG 24 hr tablet, , Disp: , Rfl:    Physical exam:  Vitals:   07/09/16 1411  BP: 121/71  Pulse: 68  Resp: 20  Temp: (!) 96.7 F (35.9 C)  TempSrc: Tympanic  Weight: Marland Kitchen)  312 lb 1.6 oz (141.6 kg)  Height: _0  (1.6 m)   Physical Exam  Constitutional: She is oriented to person, place, and time.  Patient is morbidly obese. Drifts off to sleep in between conversation. She is unable to get up and lay down on exam table  HENT:  Head: Normocephalic and atraumatic.  Eyes: EOM are normal. Pupils are equal, round, and reactive to light.  Neck: Normal range of motion.  Cardiovascular: Normal rate, regular rhythm and normal heart sounds.   Pulmonary/Chest: Effort normal and breath sounds normal.  Abdominal: Soft. Bowel sounds are normal.  Abdominal exam limited due to obesity. Surgical scar of LN biopsy is well healed. There is a ill defined left mass/ adenopathy noted. Difficult to quantify  Neurological: She is alert and oriented to person, place, and time.  Skin: Skin is warm and dry.       CMP Latest Ref Rng & Units 07/03/2016  Glucose 65 - 99 mg/dL -  BUN 6 - 20 mg/dL -  Creatinine 0.44 - 1.00 mg/dL -  Sodium 135 - 145 mmol/L -  Potassium 3.5 - 5.1 mmol/L 3.6  Chloride 101 - 111 mmol/L -  CO2 22 - 32 mmol/L -  Calcium 8.9 - 10.3 mg/dL -  Total Protein 6.5 - 8.1 g/dL -  Total Bilirubin 0.3 - 1.2 mg/dL -  Alkaline Phos 38 - 126 U/L -  AST 15 - 41 U/L -  ALT 14 - 54 U/L -   CBC Latest Ref Rng & Units 05/29/2016   WBC 3.6 - 11.0 K/uL 5.9  Hemoglobin 12.0 - 16.0 g/dL 11.5(L)  Hematocrit 35.0 - 47.0 % 33.8(L)  Platelets 150 - 440 K/uL 241    No images are attached to the encounter.  Ct Abdomen Pelvis W Contrast  Result Date: 06/14/2016 CLINICAL DATA:  Left pelvic and leg swelling for several months, history of left inguinal lymphadenopathy EXAM: CT ABDOMEN AND PELVIS WITH CONTRAST TECHNIQUE: Multidetector CT imaging of the abdomen and pelvis was performed using the standard protocol following bolus administration of intravenous contrast. CONTRAST:  19m ISOVUE-300 IOPAMIDOL (ISOVUE-300) INJECTION 61% COMPARISON:  05/24/2016 FINDINGS: Lower chest: No acute abnormality. Hepatobiliary: No focal liver abnormality is seen. No gallstones, gallbladder wall thickening, or biliary dilatation. Pancreas: Unremarkable. No pancreatic ductal dilatation or surrounding inflammatory changes. Spleen: Normal in size without focal abnormality. Adrenals/Urinary Tract: The adrenal glands are within normal limits bilaterally. Fullness of the collecting system is is noted bilaterally without definitive obstructing stone. The bladder is decompressed by Foley catheter. Stomach/Bowel: Fecal material is noted throughout the colon. No obstructive changes are seen. The appendix is within normal limits without inflammatory changes. Vascular/Lymphatic: Aortic calcifications are noted without aneurysmal dilatation. Considerable lymphadenopathy is noted in the left inguinal region. The largest of these measures 6.1 by 3.0 cm in greatest dimension. This is similar to that seen on the prior ultrasound examination and remains suspicious. Additionally a lymphomatous mass is noted within the left hemipelvis which measures approximately 10 by 4.5 cm. Scattered left iliac and periaortic lymph nodes are seen. The periaortic lymph nodes are not significantly enlarged. The other left iliac chain nodes measure upwards of 18 mm. Reproductive: The uterus is  within normal limits. Cystic change in the left ovary is noted. This is of uncertain chronicity Other: No free pelvic fluid is noted. Musculoskeletal: Degenerative changes of the lumbar spine are seen. Postsurgical changes are noted. IMPRESSION: Lymphadenopathy within the left inguinal region and the left iliac chain. This  is highly suspicious for underlying neoplasm. Biopsy is recommended for further evaluation. Chronic changes as described above Electronically Signed   By: Inez Catalina M.D.   On: 06/14/2016 14:24    Assessment and plan- Patient is a 66 y.o. female with bulky abdominal adenopathy including inguinal adenopathy. Prelim on path says B cell lymphoma  Patient quite drowsy today and unable to understand everything being told to her. I have explained the scan results to her and her friend. We are awaiting pathology to be finalized at this time.  Given that this is suspicious for B cell lymphoma, we will proceed with PET/CT scan, bone marrow biopsy. We will obtain baseline labs including cbc, cmp, uric acid, ldh, HIV hep B and Hep C testing. I will also obtain baseline echocardiogram. Dr. Jamal [name] will place port in 10 days time for anticipated chemotherapy in the future. I will see her in about 2 weeks time to discuss all these results and discuss further management at that time   Thank you for this kind referral and the opportunity to participate in the care of this patient   Visit Diagnosis 1. Malignant lymphoma, undifferentiated cell, non-Burkitt's ([place])     Dr. Randa Evens, MD, MPH [facility] Pager- [phone] 07/09/2016 4:32 PM

## 2016-07-09 NOTE — Progress Notes (Signed)
Here referred by  Dr Sanda Klein after biopsy done last week . Lymph node biopsy.

## 2016-07-10 ENCOUNTER — Inpatient Hospital Stay: Payer: Medicare Other

## 2016-07-10 ENCOUNTER — Telehealth: Payer: Self-pay | Admitting: *Deleted

## 2016-07-10 DIAGNOSIS — R1030 Lower abdominal pain, unspecified: Secondary | ICD-10-CM | POA: Diagnosis not present

## 2016-07-10 DIAGNOSIS — G834 Cauda equina syndrome: Secondary | ICD-10-CM | POA: Diagnosis not present

## 2016-07-10 DIAGNOSIS — M419 Scoliosis, unspecified: Secondary | ICD-10-CM | POA: Diagnosis not present

## 2016-07-10 DIAGNOSIS — J449 Chronic obstructive pulmonary disease, unspecified: Secondary | ICD-10-CM | POA: Diagnosis not present

## 2016-07-10 DIAGNOSIS — M7989 Other specified soft tissue disorders: Secondary | ICD-10-CM | POA: Diagnosis not present

## 2016-07-10 DIAGNOSIS — I129 Hypertensive chronic kidney disease with stage 1 through stage 4 chronic kidney disease, or unspecified chronic kidney disease: Secondary | ICD-10-CM | POA: Diagnosis not present

## 2016-07-10 DIAGNOSIS — R590 Localized enlarged lymph nodes: Secondary | ICD-10-CM | POA: Diagnosis not present

## 2016-07-10 DIAGNOSIS — M5416 Radiculopathy, lumbar region: Secondary | ICD-10-CM | POA: Diagnosis not present

## 2016-07-10 DIAGNOSIS — Z79899 Other long term (current) drug therapy: Secondary | ICD-10-CM | POA: Diagnosis not present

## 2016-07-10 DIAGNOSIS — Z8619 Personal history of other infectious and parasitic diseases: Secondary | ICD-10-CM | POA: Diagnosis not present

## 2016-07-10 DIAGNOSIS — G4730 Sleep apnea, unspecified: Secondary | ICD-10-CM | POA: Diagnosis not present

## 2016-07-10 DIAGNOSIS — Z9181 History of falling: Secondary | ICD-10-CM | POA: Diagnosis not present

## 2016-07-10 DIAGNOSIS — N183 Chronic kidney disease, stage 3 (moderate): Secondary | ICD-10-CM | POA: Diagnosis not present

## 2016-07-10 DIAGNOSIS — I739 Peripheral vascular disease, unspecified: Secondary | ICD-10-CM | POA: Diagnosis not present

## 2016-07-10 DIAGNOSIS — Z8669 Personal history of other diseases of the nervous system and sense organs: Secondary | ICD-10-CM | POA: Diagnosis not present

## 2016-07-10 DIAGNOSIS — I700 Atherosclerosis of aorta: Secondary | ICD-10-CM | POA: Diagnosis not present

## 2016-07-10 DIAGNOSIS — E871 Hypo-osmolality and hyponatremia: Secondary | ICD-10-CM

## 2016-07-10 DIAGNOSIS — Z87891 Personal history of nicotine dependence: Secondary | ICD-10-CM | POA: Diagnosis not present

## 2016-07-10 DIAGNOSIS — E876 Hypokalemia: Secondary | ICD-10-CM

## 2016-07-10 DIAGNOSIS — G35 Multiple sclerosis: Secondary | ICD-10-CM | POA: Diagnosis not present

## 2016-07-10 LAB — HEPATITIS C ANTIBODY: HCV Ab: <0.1 s/co ratio (ref 0.0–0.9)

## 2016-07-10 LAB — SURGICAL PATHOLOGY

## 2016-07-10 LAB — HIV ANTIBODY (ROUTINE TESTING W REFLEX): HIV Screen 4th Generation wRfx: NONREACTIVE

## 2016-07-10 LAB — HEPATITIS B SURFACE ANTIGEN: Hepatitis B Surface Ag: NEGATIVE

## 2016-07-10 LAB — HEPATITIS B SURFACE ANTIBODY, QUANTITATIVE: Hepatitis B-Post: <3.1 m[IU]/mL — ABNORMAL LOW (ref >9.9)

## 2016-07-10 LAB — HEPATITIS B CORE ANTIBODY, TOTAL: Hep B Core Total Ab: NEGATIVE

## 2016-07-10 MED ORDER — SODIUM CHLORIDE 0.9 % IV SOLN
Freq: Once | INTRAVENOUS | Status: AC
  Administered 2016-07-10: 14:00:00 via INTRAVENOUS
  Filled 2016-07-10: qty 1000

## 2016-07-10 NOTE — Telephone Encounter (Signed)
Called the pt and let her know that her K was low and her creat was elevated more than it was before. Dr. Janese Banks wanted to have her come over and get IVF with potassium in it. She is agreeable to come in today at 1:30. I have added her on for infusion.

## 2016-07-11 ENCOUNTER — Ambulatory Visit: Payer: Medicare Other | Attending: Oncology

## 2016-07-11 ENCOUNTER — Encounter: Payer: Self-pay | Admitting: General Surgery

## 2016-07-11 ENCOUNTER — Ambulatory Visit (INDEPENDENT_AMBULATORY_CARE_PROVIDER_SITE_OTHER): Payer: Medicare Other | Admitting: General Surgery

## 2016-07-11 VITALS — BP 144/88 | HR 86 | Resp 18 | Ht 63.0 in | Wt 312.0 lb

## 2016-07-11 DIAGNOSIS — C8295 Follicular lymphoma, unspecified, lymph nodes of inguinal region and lower limb: Secondary | ICD-10-CM

## 2016-07-11 NOTE — Patient Instructions (Addendum)
Port placement is scheduled on 07/22/2016.  PET scan 07-16-16  Implanted [facility] Guide An implanted port is a type of central line that is placed under the skin. Central lines are used to provide IV access when treatment or nutrition needs to be given through a person's veins. Implanted ports are used for long-term IV access. An implanted port may be placed because:  You need IV medicine that would be irritating to the small veins in your hands or arms.  You need long-term IV medicines, such as antibiotics.  You need IV nutrition for a long period.  You need frequent blood draws for lab tests.  You need dialysis. Implanted ports are usually placed in the chest area, but they can also be placed in the upper arm, the abdomen, or the leg. An implanted port has two main parts:  Reservoir. The reservoir is round and will appear as a small, raised area under your skin. The reservoir is the part where a needle is inserted to give medicines or draw blood.  Catheter. The catheter is a thin, flexible tube that extends from the reservoir. The catheter is placed into a large vein. Medicine that is inserted into the reservoir goes into the catheter and then into the vein. How will I care for my incision site? Do not get the incision site wet. Bathe or shower as directed by your health care provider. How is my port accessed? Special steps must be taken to access the port:  Before the port is accessed, a numbing cream can be placed on the skin. This helps numb the skin over the port site.  Your health care provider uses a sterile technique to access the port.  Your health care provider must put on a mask and sterile gloves.  The skin over your port is cleaned carefully with an antiseptic and allowed to dry.  The port is gently pinched between sterile gloves, and a needle is inserted into the port.  Only "non-coring" port needles should be used to access the port. Once the port is accessed, a  blood return should be checked. This helps ensure that the port is in the vein and is not clogged.  If your port needs to remain accessed for a constant infusion, a clear (transparent) bandage will be placed over the needle site. The bandage and needle will need to be changed every week, or as directed by your health care provider.  Keep the bandage covering the needle clean and dry. Do not get it wet. Follow your health care provider's instructions on how to take a shower or bath while the port is accessed.  If your port does not need to stay accessed, no bandage is needed over the port. What is flushing? Flushing helps keep the port from getting clogged. Follow your health care provider's instructions on how and when to flush the port. Ports are usually flushed with saline solution or a medicine called heparin. The need for flushing will depend on how the port is used.  If the port is used for intermittent medicines or blood draws, the port will need to be flushed:  After medicines have been given.  After blood has been drawn.  As part of routine maintenance.  If a constant infusion is running, the port may not need to be flushed. How long will my port stay implanted? The port can stay in for as long as your health care provider thinks it is needed. When it is time for the  port to come out, surgery will be done to remove it. The procedure is similar to the one performed when the port was put in. When should I seek immediate medical care? When you have an implanted port, you should seek immediate medical care if:  You notice a bad smell coming from the incision site.  You have swelling, redness, or drainage at the incision site.  You have more swelling or pain at the port site or the surrounding area.  You have a fever that is not controlled with medicine. This information is not intended to replace advice given to you by your health care provider. Make sure you discuss any questions  you have with your health care provider. Document Released: 04/15/2005 Document Revised: 09/21/2015 Document Reviewed: 12/21/2012 Elsevier Interactive Patient Education  2017 [employer].

## 2016-07-11 NOTE — Progress Notes (Addendum)
Patient ID: [name], female   DOB: [birth year], 66 y.o.   MRN: [id number]  Chief Complaint  Patient presents with  . Routine Post Op  . Follow-up    HPI [name] is a 66 y.o. female here today for her follow up left inguinal lymph node biopsy done on 07/04/2016, also here today to discuss port placement.  I have reviewed the history of present illness with the patient.  HPI  Past Medical History:  Diagnosis Date  . ADHD   . Anxiety   . COPD (chronic obstructive pulmonary disease) ([place])   . Depression    major depressive  . Dyspnea    doe  . Edema    left leg  . Follicular lymphoma ([place])    B Cell  . Hypertension   . Hypotension    idiopathic  . Kyphoscoliosis and scoliosis 11/26/2011  . Morbid obesity ([place]) 01/05/2016  . Multiple sclerosis ([place])   . Multiple sclerosis ([place])    1980's  . Neuromuscular disorder ([place])   . Obstructive and reflux uropathy    foley  . Pain    atypical facial  . Peripheral vascular disease of lower extremity with ulceration ([place]) 11/08/2015  . Skin ulcer ([place]) 11/08/2015  . Weakness    generalized. has MS    Past Surgical History:  Procedure Laterality Date  . BACK SURGERY N/A 2002  . CYST EXCISION     lower back  . INGUINAL LYMPH NODE BIOPSY Left 07/04/2016   Procedure: INGUINAL LYMPH NODE BIOPSY;  Surgeon: Christene Lye, MD;  Location: [facility] ORS;  Service: General;  Laterality: Left;  . TONSILLECTOMY AND ADENOIDECTOMY    . TUBAL LIGATION      Family History  Problem Relation Age of Onset  . COPD Mother   . Diabetes Mother   . Heart failure Mother   . Alcohol abuse Father   . Kidney disease Father   . Kidney failure Father   . Arthritis Sister   . CAD Maternal Grandmother   . Stroke Maternal Grandfather   . Diabetes Sister   . Arthritis Sister   . Arthritis Brother     Social History Social History  Substance Use Topics  . Smoking status: Former Smoker    Packs/day: 1.00    Years: 20.00    Types:  Cigarettes    Quit date: 02/03/2016  . Smokeless tobacco: Never Used  . Alcohol use No    No Known Allergies  No current facility-administered medications for this visit.    No current outpatient prescriptions on file.   Facility-Administered Medications Ordered in Other Visits  Medication Dose Route Frequency Provider Last Rate Last Dose  . ceFAZolin (ANCEF) 3 g in dextrose 5 % 50 mL IVPB  3 g Intravenous On Call to Nason, MD      . Chlorhexidine Gluconate Cloth 2 % PADS 6 each  6 each Topical Once Seeplaputhur Robinette Haines, MD      . famotidine (PEPCID) tablet 20 mg  20 mg Oral Once Martha Clan, MD      . lactated ringers infusion   Intravenous Continuous Martha Clan, MD        Review of Systems Review of Systems  Constitutional: Negative.   Respiratory: Negative.   Cardiovascular: Negative.     Blood pressure (!) 144/88, pulse 86, resp. rate 18, height 5\' 3"  (1.6 m), weight (!) 312 lb (141.5 kg).  Physical Exam Physical Exam  Constitutional:  She is oriented to person, place, and time. She appears well-developed and well-nourished.  HENT:  Mouth/Throat: Oropharynx is clear and moist.  Eyes: Conjunctivae are normal. No scleral icterus.  Neck: Neck supple.  Cardiovascular: Normal rate, regular rhythm and normal heart sounds.   Pulmonary/Chest: Effort normal and breath sounds normal.  Abdominal: Soft. There is no tenderness.  Lymphadenopathy:    She has no cervical adenopathy.  Neurological: She is alert and oriented to person, place, and time.  Skin: Skin is warm and dry.  Psychiatric: Her behavior is normal.  Left groin incision is clean and healing well  Data Reviewed Path report  Assessment  B cell lymphoma     Plan    Patient has been seen by oncology and has a PET scan scheduled for next week. Chemo to start the week after.     Discussed port placement with patient. Procedure explained and she is agreeable. PET scan 07-16-16 Port  placement is scheduled on 07/22/2016.  This information has been scribed by Gaspar Cola CMA.  SANKAR,SEEPLAPUTHUR G 07/22/2016, 8:42 AM

## 2016-07-12 DIAGNOSIS — I950 Idiopathic hypotension: Secondary | ICD-10-CM | POA: Diagnosis not present

## 2016-07-12 DIAGNOSIS — I10 Essential (primary) hypertension: Secondary | ICD-10-CM | POA: Diagnosis not present

## 2016-07-12 DIAGNOSIS — E559 Vitamin D deficiency, unspecified: Secondary | ICD-10-CM | POA: Diagnosis not present

## 2016-07-12 DIAGNOSIS — M1991 Primary osteoarthritis, unspecified site: Secondary | ICD-10-CM | POA: Diagnosis not present

## 2016-07-12 DIAGNOSIS — G4733 Obstructive sleep apnea (adult) (pediatric): Secondary | ICD-10-CM | POA: Diagnosis not present

## 2016-07-12 DIAGNOSIS — G43909 Migraine, unspecified, not intractable, without status migrainosus: Secondary | ICD-10-CM | POA: Diagnosis not present

## 2016-07-12 DIAGNOSIS — Z9181 History of falling: Secondary | ICD-10-CM | POA: Diagnosis not present

## 2016-07-12 DIAGNOSIS — G35 Multiple sclerosis: Secondary | ICD-10-CM | POA: Diagnosis not present

## 2016-07-12 DIAGNOSIS — J449 Chronic obstructive pulmonary disease, unspecified: Secondary | ICD-10-CM | POA: Diagnosis not present

## 2016-07-12 DIAGNOSIS — M5416 Radiculopathy, lumbar region: Secondary | ICD-10-CM | POA: Diagnosis not present

## 2016-07-12 DIAGNOSIS — I2729 Other secondary pulmonary hypertension: Secondary | ICD-10-CM | POA: Diagnosis not present

## 2016-07-12 DIAGNOSIS — G501 Atypical facial pain: Secondary | ICD-10-CM | POA: Diagnosis not present

## 2016-07-12 DIAGNOSIS — G8911 Acute pain due to trauma: Secondary | ICD-10-CM | POA: Diagnosis not present

## 2016-07-12 DIAGNOSIS — I739 Peripheral vascular disease, unspecified: Secondary | ICD-10-CM | POA: Diagnosis not present

## 2016-07-12 DIAGNOSIS — G629 Polyneuropathy, unspecified: Secondary | ICD-10-CM | POA: Diagnosis not present

## 2016-07-15 ENCOUNTER — Encounter: Payer: Self-pay | Admitting: General Surgery

## 2016-07-15 ENCOUNTER — Telehealth: Payer: Self-pay | Admitting: *Deleted

## 2016-07-15 DIAGNOSIS — I950 Idiopathic hypotension: Secondary | ICD-10-CM | POA: Diagnosis not present

## 2016-07-15 DIAGNOSIS — I2729 Other secondary pulmonary hypertension: Secondary | ICD-10-CM | POA: Diagnosis not present

## 2016-07-15 DIAGNOSIS — J449 Chronic obstructive pulmonary disease, unspecified: Secondary | ICD-10-CM | POA: Diagnosis not present

## 2016-07-15 DIAGNOSIS — G35 Multiple sclerosis: Secondary | ICD-10-CM | POA: Diagnosis not present

## 2016-07-15 DIAGNOSIS — I739 Peripheral vascular disease, unspecified: Secondary | ICD-10-CM | POA: Diagnosis not present

## 2016-07-15 DIAGNOSIS — G43909 Migraine, unspecified, not intractable, without status migrainosus: Secondary | ICD-10-CM | POA: Diagnosis not present

## 2016-07-15 DIAGNOSIS — M1991 Primary osteoarthritis, unspecified site: Secondary | ICD-10-CM | POA: Diagnosis not present

## 2016-07-15 DIAGNOSIS — I10 Essential (primary) hypertension: Secondary | ICD-10-CM | POA: Diagnosis not present

## 2016-07-15 DIAGNOSIS — G629 Polyneuropathy, unspecified: Secondary | ICD-10-CM | POA: Diagnosis not present

## 2016-07-15 DIAGNOSIS — G501 Atypical facial pain: Secondary | ICD-10-CM | POA: Diagnosis not present

## 2016-07-15 DIAGNOSIS — G4733 Obstructive sleep apnea (adult) (pediatric): Secondary | ICD-10-CM | POA: Diagnosis not present

## 2016-07-15 DIAGNOSIS — M5416 Radiculopathy, lumbar region: Secondary | ICD-10-CM | POA: Diagnosis not present

## 2016-07-15 NOTE — Telephone Encounter (Signed)
Called pt to let her know the BM bx will be 3/22 [facility] at [facility] mall at 7:30 and get procedure 8:30. NPO after midnight the day before procedure, will need a driver to take you home. Pt agreeable and understands instructions

## 2016-07-16 ENCOUNTER — Ambulatory Visit
Admission: RE | Admit: 2016-07-16 | Discharge: 2016-07-16 | Disposition: A | Discharge status: Home / Self Care | Payer: Medicare Other | Source: Ambulatory Visit | Attending: Oncology | Admitting: Oncology

## 2016-07-16 ENCOUNTER — Telehealth: Payer: Self-pay | Admitting: Family Medicine

## 2016-07-16 DIAGNOSIS — C8590 Non-Hodgkin lymphoma, unspecified, unspecified site: Secondary | ICD-10-CM | POA: Diagnosis not present

## 2016-07-16 DIAGNOSIS — S2242XD Multiple fractures of ribs, left side, subsequent encounter for fracture with routine healing: Secondary | ICD-10-CM | POA: Insufficient documentation

## 2016-07-16 DIAGNOSIS — J90 Pleural effusion, not elsewhere classified: Secondary | ICD-10-CM | POA: Diagnosis not present

## 2016-07-16 DIAGNOSIS — I700 Atherosclerosis of aorta: Secondary | ICD-10-CM | POA: Diagnosis not present

## 2016-07-16 DIAGNOSIS — K5900 Constipation, unspecified: Secondary | ICD-10-CM | POA: Insufficient documentation

## 2016-07-16 DIAGNOSIS — X58XXXD Exposure to other specified factors, subsequent encounter: Secondary | ICD-10-CM | POA: Diagnosis not present

## 2016-07-16 DIAGNOSIS — C8580 Other specified types of non-Hodgkin lymphoma, unspecified site: Secondary | ICD-10-CM | POA: Insufficient documentation

## 2016-07-16 LAB — GLUCOSE, CAPILLARY: Glucose-Capillary: 94 mg/dL (ref 65–99)

## 2016-07-16 MED ORDER — FLUDEOXYGLUCOSE F - 18 (FDG) INJECTION
12.0000 | Freq: Once | INTRAVENOUS | Status: AC | PRN
  Administered 2016-07-16: 12.19 via INTRAVENOUS

## 2016-07-16 NOTE — Telephone Encounter (Signed)
I thought patient was still at [facility] If so, their doctor should be managing orders; thank you If she has left [facility], please document the date I'll need diagnosis codes from [facility] and what kind of therapy (I haven't ordered any that I see); who gave them the original referral? Thanks

## 2016-07-16 NOTE — Telephone Encounter (Signed)
Angelena Sole from [facility] requesting verbal for nursing visits. Also need order for social worker [phone]

## 2016-07-17 ENCOUNTER — Other Ambulatory Visit: Payer: Self-pay | Admitting: Radiology

## 2016-07-17 ENCOUNTER — Encounter
Admission: RE | Admit: 2016-07-17 | Discharge: 2016-07-17 | Disposition: A | Discharge status: Home / Self Care | Payer: Medicare Other | Source: Ambulatory Visit | Attending: General Surgery | Admitting: General Surgery

## 2016-07-17 NOTE — Patient Instructions (Signed)
  Your procedure is scheduled on: 07-22-16  Report to Same Day Surgery 2nd floor medical mall [facility] Entrance-take elevator on left to 2nd floor.  Check in with surgery information desk.) To find out your arrival time please call [phone] between 1PM - 3PM on 07-19-16  Remember: Instructions that are not followed completely may result in serious medical risk, up to and including death, or upon the discretion of your surgeon and anesthesiologist your surgery may need to be rescheduled.    _x___ 1. Do not eat food or drink liquids after midnight. No gum chewing or  hard candies.     __x__ 2. No Alcohol for 24 hours before or after surgery.   __x__3. No Smoking for 24 prior to surgery.   ____  4. Bring all medications with you on the day of surgery if instructed.    __x__ 5. Notify your doctor if there is any change in your medical condition     (cold, fever, infections).     Do not wear jewelry, make-up, hairpins, clips or nail polish.  Do not wear lotions, powders, or perfumes. You may wear deodorant.  Do not shave 48 hours prior to surgery. Men may shave face and neck.  Do not bring valuables to the hospital.    [facility] is not responsible for any belongings or valuables.               Contacts, dentures or bridgework may not be worn into surgery.  Leave your suitcase in the car. After surgery it may be brought to your room.  For patients admitted to the hospital, discharge time is determined by your treatment team.   Patients discharged the day of surgery will not be allowed to drive home.  You will need someone to drive you home and stay with you the night of your procedure.    Please read over the following fact sheets that you were given:   [facility] Preparing for Surgery and or MRSA Information   _x___ Take anti-hypertensive (unless it includes a diuretic), cardiac, seizure, asthma,     anti-reflux and psychiatric medicines. These include:  1. [place]  3. GABAPENTIN  4. TOPAMAX  5. ZANAFLEX  6.  ____Fleets enema or Magnesium Citrate as directed.   ____ Use CHG Soap or sage wipes as directed on instruction sheet   _X___ Use inhalers on the day of surgery and bring to hospital day of [place] AM OF SURGERY  ____ Stop Metformin and Janumet 2 days prior to surgery.    ____ Take 1/2 of usual insulin dose the night before surgery and none on the morning     surgery.   ____ Follow recommendations from Cardiologist, Pulmonologist or PCP regarding stopping Aspirin, Coumadin, Pllavix ,Eliquis, Effient, or Pradaxa, and Pletal.  X____Stop Anti-inflammatories such as Advil, Aleve, Ibuprofen, Motrin, Naproxen, Naprosyn, Goodies powders or aspirin products. OK to take Tylenol OR TRAMADOL IF NEEDED   ____ Stop supplements until after surgery.    ____ Bring C-Pap to the hospital.

## 2016-07-17 NOTE — Pre-Procedure Instructions (Signed)
COMPLETED PHONE INTERVIEW FOR UPCOMING PORT PLACEMENT ON 07-22-16.  PT HAD LOW POTASSIUM ON 07-10-16 AND DR RAO ORDERED IV K+ THAT PT COMPLETED. PT IS ALSO NOW TAKING PO K=.  PT UNABLE TO COME IN FOR REPEAT K+ PRIOR TO SURGERY BECAUSE SHE IS HAVING A BONE MARROW BIOPSY TOMORROW (07-18-16).  WILL RECHECK K+ AM OF SURGERY 

## 2016-07-18 ENCOUNTER — Other Ambulatory Visit: Payer: Self-pay | Admitting: Radiology

## 2016-07-18 ENCOUNTER — Ambulatory Visit
Admission: RE | Admit: 2016-07-18 | Discharge: 2016-07-18 | Disposition: A | Discharge status: Home / Self Care | Payer: Medicare Other | Source: Ambulatory Visit | Attending: Oncology | Admitting: Oncology

## 2016-07-18 ENCOUNTER — Other Ambulatory Visit (HOSPITAL_COMMUNITY)
Admission: RE | Admit: 2016-07-18 | Disposition: A | Discharge status: Home / Self Care | Payer: Medicare Other | Source: Ambulatory Visit | Attending: Oncology | Admitting: Oncology

## 2016-07-18 DIAGNOSIS — Z79899 Other long term (current) drug therapy: Secondary | ICD-10-CM | POA: Insufficient documentation

## 2016-07-18 DIAGNOSIS — G35 Multiple sclerosis: Secondary | ICD-10-CM | POA: Insufficient documentation

## 2016-07-18 DIAGNOSIS — I10 Essential (primary) hypertension: Secondary | ICD-10-CM | POA: Diagnosis not present

## 2016-07-18 DIAGNOSIS — Z8249 Family history of ischemic heart disease and other diseases of the circulatory system: Secondary | ICD-10-CM | POA: Insufficient documentation

## 2016-07-18 DIAGNOSIS — D7589 Other specified diseases of blood and blood-forming organs: Secondary | ICD-10-CM | POA: Diagnosis not present

## 2016-07-18 DIAGNOSIS — C8510 Unspecified B-cell lymphoma, unspecified site: Secondary | ICD-10-CM | POA: Diagnosis not present

## 2016-07-18 DIAGNOSIS — Z6841 Body Mass Index (BMI) 40.0 and over, adult: Secondary | ICD-10-CM | POA: Diagnosis not present

## 2016-07-18 DIAGNOSIS — J449 Chronic obstructive pulmonary disease, unspecified: Secondary | ICD-10-CM | POA: Insufficient documentation

## 2016-07-18 DIAGNOSIS — F909 Attention-deficit hyperactivity disorder, unspecified type: Secondary | ICD-10-CM | POA: Diagnosis not present

## 2016-07-18 DIAGNOSIS — C8580 Other specified types of non-Hodgkin lymphoma, unspecified site: Secondary | ICD-10-CM | POA: Insufficient documentation

## 2016-07-18 DIAGNOSIS — Z87891 Personal history of nicotine dependence: Secondary | ICD-10-CM | POA: Insufficient documentation

## 2016-07-18 DIAGNOSIS — Z8261 Family history of arthritis: Secondary | ICD-10-CM | POA: Insufficient documentation

## 2016-07-18 DIAGNOSIS — Z833 Family history of diabetes mellitus: Secondary | ICD-10-CM | POA: Diagnosis not present

## 2016-07-18 DIAGNOSIS — F419 Anxiety disorder, unspecified: Secondary | ICD-10-CM | POA: Diagnosis not present

## 2016-07-18 DIAGNOSIS — K219 Gastro-esophageal reflux disease without esophagitis: Secondary | ICD-10-CM | POA: Diagnosis not present

## 2016-07-18 DIAGNOSIS — F329 Major depressive disorder, single episode, unspecified: Secondary | ICD-10-CM | POA: Insufficient documentation

## 2016-07-18 LAB — CBC WITH DIFFERENTIAL/PLATELET
Basophils Absolute: 0.1 10*3/uL (ref 0–0.1)
Basophils Relative: 1 %
Eosinophils Absolute: 0.4 10*3/uL (ref 0–0.7)
Eosinophils Relative: 6 %
HCT: 36.5 % (ref 35.0–47.0)
Hemoglobin: 12.3 g/dL (ref 12.0–16.0)
Lymphocytes Relative: 15 %
Lymphs Abs: 0.9 10*3/uL — ABNORMAL LOW (ref 1.0–3.6)
MCH: 28.2 pg (ref 26.0–34.0)
MCHC: 33.7 g/dL (ref 32.0–36.0)
MCV: 83.9 fL (ref 80.0–100.0)
Monocytes Absolute: 0.6 10*3/uL (ref 0.2–0.9)
Monocytes Relative: 10 %
Neutro Abs: 4.3 10*3/uL (ref 1.4–6.5)
Neutrophils Relative %: 68 %
Platelets: 242 10*3/uL (ref 150–440)
RBC: 4.35 MIL/uL (ref 3.80–5.20)
RDW: 14.7 % — ABNORMAL HIGH (ref 11.5–14.5)
WBC: 6.3 10*3/uL (ref 3.6–11.0)

## 2016-07-18 LAB — PROTIME-INR
INR: 1.05
Prothrombin Time: 13.7 seconds (ref 11.4–15.2)

## 2016-07-18 LAB — APTT: aPTT: 40 seconds — ABNORMAL HIGH (ref 24–36)

## 2016-07-18 MED ORDER — FENTANYL CITRATE (PF) 100 MCG/2ML IJ SOLN
INTRAMUSCULAR | Status: AC | PRN
  Administered 2016-07-18: 50 ug via INTRAVENOUS

## 2016-07-18 MED ORDER — MIDAZOLAM HCL 2 MG/2ML IJ SOLN
INTRAMUSCULAR | Status: AC | PRN
  Administered 2016-07-18: 1 mg via INTRAVENOUS

## 2016-07-18 MED ORDER — SODIUM CHLORIDE 0.9 % IV SOLN
INTRAVENOUS | Status: DC
  Administered 2016-07-18: 09:00:00 via INTRAVENOUS

## 2016-07-18 NOTE — Telephone Encounter (Signed)
I tried to call [name] and could not get through Please give her the okay for therapy Thank you

## 2016-07-18 NOTE — Consult Note (Signed)
Chief Complaint: B-cell lymphoma.   Referring Physician(s): Rao,Archana C  Patient Status: [facility] - Out-pt  History of Present Illness: [name] is a 66 y.o. female with complex past medical history significant for COPD, hypertension, morbid obesity, multiple sclerosis obstructive and reflux of uropathy with chronic Foley catheter, with recent diagnosis of B-cell lymphoma who presents today for CT-guided bone marrow biopsy and aspiration. She is accompanied by family member though serves as her own historian.  Patient states that she feels stronger since last being seen by Dr. Janese Banks. She is currently without complaint. No fever or chills. No chest pain or shortness of breath.  Past Medical History:  Diagnosis Date  . ADHD   . Anxiety   . COPD (chronic obstructive pulmonary disease) ([place])   . Depression    major depressive  . Dyspnea    doe  . Edema    left leg  . Follicular lymphoma ([place])    B Cell  . Hypertension   . Hypotension    idiopathic  . Kyphoscoliosis and scoliosis 11/26/2011  . Morbid obesity ([place]) 01/05/2016  . Multiple sclerosis ([place])   . Multiple sclerosis ([place])    1980's  . Neuromuscular disorder ([place])   . Obstructive and reflux uropathy    foley  . Pain    atypical facial  . Peripheral vascular disease of lower extremity with ulceration ([place]) 11/08/2015  . Skin ulcer ([place]) 11/08/2015  . Weakness    generalized. has MS    Past Surgical History:  Procedure Laterality Date  . BACK SURGERY N/A 2002  . CYST EXCISION     lower back  . INGUINAL LYMPH NODE BIOPSY Left 07/04/2016   Procedure: INGUINAL LYMPH NODE BIOPSY;  Surgeon: Christene Lye, MD;  Location: [facility] ORS;  Service: General;  Laterality: Left;  . TONSILLECTOMY AND ADENOIDECTOMY    . TUBAL LIGATION      Allergies: Patient has no known allergies.  Medications: Prior to Admission medications   Medication Sig Start Date End Date Taking? Authorizing Provider  buPROPion (WELLBUTRIN  XL) 300 MG 24 hr tablet Take 300 mg by mouth every morning.  05/28/16  Yes Historical Provider, MD  clonazePAM (KLONOPIN) 1 MG tablet Take 1 tablet by mouth at bedtime.  07/12/13  Yes Historical Provider, MD  Dalfampridine (AMPYRA PO) Take by mouth daily.   Yes Historical Provider, MD  DULoxetine (CYMBALTA) 60 MG capsule Take 60 mg by mouth every morning.  07/26/13  Yes Historical Provider, MD  fluticasone furoate-vilanterol (BREO ELLIPTA) 100-25 MCG/INH AEPB Inhale 1 puff into the lungs daily. 01/05/16  Yes Arnetha Courser, MD  gabapentin (NEURONTIN) 600 MG tablet Take 1 tablet (600 mg total) by mouth 3 (three) times daily. 07/04/16  Yes Seeplaputhur Robinette Haines, MD  methylphenidate (RITALIN) 10 MG tablet Take 10 mg by mouth 2 (two) times daily.  01/23/15  Yes Historical Provider, MD  Multiple Vitamin (MULTIVITAMIN WITH MINERALS) TABS tablet Take 1 tablet by mouth daily.   Yes Historical Provider, MD  polyethylene glycol powder (GLYCOLAX/MIRALAX) powder Take 17 g by mouth daily. Mixed in water Patient taking differently: Take 17 g by mouth as needed. Mixed in water 03/11/16  Yes Arnetha Courser, MD  potassium chloride (K-DUR) 10 MEQ tablet Take 1 tablet (10 mEq total) by mouth daily. 12/22/15  Yes Arnetha Courser, MD  QUEtiapine Fumarate (SEROQUEL XR) 150 MG 24 hr tablet Take 150 mg by mouth at bedtime.  07/24/13  Yes Historical  Provider, MD  solifenacin (VESICARE) 5 MG tablet Take 5 mg by mouth daily.   Yes Historical Provider, MD  tiotropium (SPIRIVA) 18 MCG inhalation capsule Place 18 mcg into inhaler and inhale daily.   Yes Historical Provider, MD  tiZANidine (ZANAFLEX) 4 MG tablet Take 4 mg by mouth 4 (four) times daily.    Yes Historical Provider, MD  topiramate (TOPAMAX) 50 MG tablet Take 1 tablet (50 mg total) by mouth 2 (two) times daily. 05/18/16  Yes Arnetha Courser, MD  traMADol (ULTRAM) 50 MG tablet Take 1 tablet (50 mg total) by mouth every 6 (six) hours as needed. Patient taking differently: Take 50  mg by mouth every 6 (six) hours as needed (for pain).  02/28/16  Yes Daymon Larsen, MD  triamterene-hydrochlorothiazide (MAXZIDE) 75-50 MG tablet Take 1 tablet by mouth daily.   Yes Historical Provider, MD     Family History  Problem Relation Age of Onset  . COPD Mother   . Diabetes Mother   . Heart failure Mother   . Alcohol abuse Father   . Kidney disease Father   . Kidney failure Father   . Arthritis Sister   . CAD Maternal Grandmother   . Stroke Maternal Grandfather   . Diabetes Sister   . Arthritis Sister   . Arthritis Brother     Social History   Social History  . Marital status: Married    Spouse name: N/A  . Number of children: 3  . Years of education: N/A   Occupational History  . disabled    Social History Main Topics  . Smoking status: Former Smoker    Packs/day: 1.00    Years: 20.00    Types: Cigarettes    Quit date: 02/03/2016  . Smokeless tobacco: Never Used  . Alcohol use No  . Drug use: Yes    Types: Marijuana     Comment: smokes THC daily per pt   . Sexual activity: No   Other Topics Concern  . None   Social History Narrative  . None    ECOG Status: 2 - Symptomatic, <50% confined to bed  Review of Systems: A 12 point ROS discussed and pertinent positives are indicated in the HPI above.  All other systems are negative.  Review of Systems  Constitutional: Negative for activity change.  Respiratory: Negative.   Cardiovascular: Negative.     Vital Signs: Temp 97.8 F (36.6 C)   Wt 294 lb (133.4 kg)   BMI 52.08 kg/m   Physical Exam  Constitutional: She appears well-developed and well-nourished.  Cardiovascular: Normal rate and regular rhythm.   Pulmonary/Chest: Effort normal and breath sounds normal.  Psychiatric:  Patient is somewhat lethargic.  Nursing note and vitals reviewed.  Imaging: Nm Pet Image Initial (pi) Skull Base To Thigh  Result Date: 07/16/2016 CLINICAL DATA:  Initial treatment strategy for undifferentiated  B-cell lymphoma. EXAM: NUCLEAR MEDICINE PET SKULL BASE TO THIGH TECHNIQUE: 12.2 mCi F-18 FDG was injected intravenously. Full-ring PET imaging was performed from the skull base to thigh after the radiotracer. CT data was obtained and used for attenuation correction and anatomic localization. FASTING BLOOD GLUCOSE:  Value: 94 mg/dl COMPARISON:  CT scan from 06/14/2016 FINDINGS: NECK Suspected remote infarct in the left posterior cerebellum. Accentuated activity along the right lateral pterygoid muscle, maximum SUV 7.6, no as symmetric CT correlate, nonspecific but possibly physiologic. Physiologic activity along musculature in the upper neck CHEST Right lower paratracheal adenopathy, short axis diameter 1.4 cm  on image 77/3, maximum SUV 6.6. Background mediastinal blood pool activity SUV 4.9. Coronary atherosclerosis. Trace left pleural effusion. No appreciable significant pulmonary nodule. Airway thickening is present, suggesting bronchitis or reactive airways disease. ABDOMEN/PELVIS Hypermetabolic retroperitoneal, common iliac, left external iliac, and left inguinal adenopathy observed. Confluent left external iliac adenopathy short axis diameter 5.2 cm with maximum SUV 16.4. Small anterior fluid collection along the left inguinal adenopathy likely related to recent biopsy. Background hepatic metabolic activity 5.0. No hypermetabolic activity or enlargement of the spleen. Aortoiliac atherosclerotic vascular disease. Postoperative findings in the lumbar spine. On image 197 of series 3 there is an omental nodule anteriorly measuring 1.2 by 1.5 cm, this is not hypermetabolic. There is some mild wall thickening in the rectum without significant hypermetabolic activity. Foley catheter is present in the urinary bladder. Mild deformity of the left iliac crest. Prominent stool throughout the colon favors constipation. SKELETON Late healing fractures of the left sixth, seventh, eighth, and ninth ribs anteriorly without a  significant degree of associated hypermetabolic activity. IMPRESSION: 1. Pathologic and hypermetabolic lower retroperitoneal, pelvic, and inguinal adenopathy compatible with [place]au[place] 5 malignancy. 2. Mildly prominent right lower paratracheal lymph node, [place]au[place] 4. 3. Accentuated activity in the right lateral pterygoid muscle without CT correlate, probably incidental, merits attention on any follow up. 4. Other imaging findings of potential clinical significance: Airway thickening is present, suggesting bronchitis or reactive airways disease. Trace left pleural effusion. Coronary and aortoiliac atherosclerosis. Prominent stool throughout the colon favors constipation. Late phase healing fractures of left lower anterior ribs. Electronically Signed   By: Van Clines M.D.   On: 07/16/2016 14:17    Labs:  CBC:  Recent Labs  02/28/16 1637 05/29/16 1313 07/09/16 1515 07/18/16 0824  WBC 7.6 5.9 7.1 6.3  HGB 13.4 11.5* 13.2 12.3  HCT 39.9 33.8* 38.0 36.5  PLT 231 241 278 242    COAGS: No results for input(s): INR, APTT in the last 8760 hours.  BMP:  Recent Labs  12/18/15 1624 02/28/16 1637 05/29/16 1313 07/03/16 1214 07/09/16 1515  NA 138 139 136  --  129*  K 3.7 3.6 3.5 3.6 3.3*  CL 104 107 102  --  93*  CO2 _0 --  26  GLUCOSE 96 106* 115*  --  93  BUN 20 22* 22*  --  24*  CALCIUM 9.6 9.4 8.8*  --  9.5  CREATININE 1.40* 1.54* 1.36*  --  1.47*  GFRNONAA 39* 34* 40*  --  36*  GFRAA 45* 40* 46*  --  42*    LIVER FUNCTION TESTS:  Recent Labs  10/26/15 1811 07/09/16 1515  BILITOT 0.4 0.3  AST 22 24  ALT 18 14  ALKPHOS 60 83  PROT 6.9 7.9  ALBUMIN 3.8 4.0    TUMOR MARKERS: No results for input(s): AFPTM, CEA, CA199, CHROMGRNA in the last 8760 hours.  Assessment and Plan:  JEANIA NATER is a 66 y.o. female with complex past medical history significant for COPD, hypertension, morbid obesity, multiple sclerosis obstructive and reflux of uropathy with  chronic Foley catheter, with recent diagnosis of B-cell lymphoma who presents today for CT-guided bone marrow biopsy and aspiration. She is accompanied by family member though serves as her own historian.  Risks and benefits of CT-guided bone marrow biopsy and aspiration were discussed with the patient including, but not limited to bleeding, infection, damage to adjacent structures or low yield requiring additional tests.  All of the patient's questions were  answered, patient is agreeable to proceed.  Consent signed and in chart.  Thank you for this interesting consult.  I greatly enjoyed meeting [name] and look forward to participating in their care.  A copy of this report was sent to the requesting provider on this date.  Electronically Signed: Sandi Mariscal 07/18/2016, 8:59 AM   I spent a total of 15 Minutes in face to face in clinical consultation, greater than 50% of which was counseling/coordinating care for CT-guided bone marrow biopsy and aspiration.

## 2016-07-18 NOTE — Telephone Encounter (Signed)
I tried to contact [name] to get clarity on what is needed and who initiated this process, but there was no answer. A message was left for her to give [place] a call back at her earliest convenience.

## 2016-07-18 NOTE — Procedures (Signed)
Pre-procedure Diagnosis: Lymphoma Post-procedure Diagnosis: Same  Technically successful CT guided bone marrow aspiration and biopsy of left iliac crest.   Complications: None Immediate  EBL: None  SignedSandi Mariscal Pager: [phone] 07/18/2016, 10:20 AM

## 2016-07-18 NOTE — Telephone Encounter (Signed)
Andee Poles called back and stated that patient came to [facility] on 07-14-16. Dr Lovie Macadamia from [facility] orginally put the order in. Diagnosis is MS (code is [id number]) and recently diagnosed with lymphona. Dr Lovie Macadamia requesting nursing and physical therapy and [facility] is requesting to add social work evaluation. Stated that patient was sent to rehab after progressive weakness and falls. Andee Poles can be reached at [phone]

## 2016-07-18 NOTE — Discharge Instructions (Signed)
Bone Marrow Aspiration and Bone Marrow Biopsy, Adult, Care After This sheet gives you information about how to care for yourself after your procedure. Your health care provider may also give you more specific instructions. If you have problems or questions, contact your health care provider. What can I expect after the procedure? After the procedure, it is common to have:  Mild pain and tenderness.  Swelling.  Bruising. Follow these instructions at home:  Take over-the-counter or prescription medicines only as told by your health care provider.  Do not take baths, swim, or use a hot tub until your health care provider approves. Ask if you can take a shower or have a sponge bath.  Follow instructions from your health care provider about how to take care of the puncture site. Make sure you:  Wash your hands with soap and water before you change your bandage (dressing). If soap and water are not available, use hand sanitizer.  Change your dressing as told by your health care provider.  Check your puncture siteevery day for signs of infection. Check for:  More redness, swelling, or pain.  More fluid or blood.  Warmth.  Pus or a bad smell.  Return to your normal activities as told by your health care provider. Ask your health care provider what activities are safe for you.  Do not drive for 24 hours if you were given a medicine to help you relax (sedative).  Keep all follow-up visits as told by your health care provider. This is important. Contact a health care provider if:  You have more redness, swelling, or pain around the puncture site.  You have more fluid or blood coming from the puncture site.  Your puncture site feels warm to the touch.  You have pus or a bad smell coming from the puncture site.  You have a fever.  Your pain is not controlled with medicine. This information is not intended to replace advice given to you by your health care provider. Make sure you  discuss any questions you have with your health care provider. Document Released: 11/02/2004 Document Revised: 11/03/2015 Document Reviewed: 09/27/2015 Elsevier Interactive Patient Education  2017 [employer].

## 2016-07-19 NOTE — Telephone Encounter (Signed)
Left detailed verbal order on [name]'s voicemail for PT and social work evaluation.

## 2016-07-21 MED ORDER — DEXTROSE 5 % IV SOLN
3.0000 g | INTRAVENOUS | Status: DC
  Filled 2016-07-21: qty 3000

## 2016-07-22 ENCOUNTER — Ambulatory Visit
Admission: RE | Admit: 2016-07-22 | Discharge: 2016-07-22 | Disposition: A | Discharge status: Home / Self Care | Payer: Medicare Other | Source: Ambulatory Visit | Attending: General Surgery | Admitting: General Surgery

## 2016-07-22 ENCOUNTER — Encounter: Payer: Self-pay | Admitting: *Deleted

## 2016-07-22 ENCOUNTER — Ambulatory Visit: Payer: Medicare Other | Admitting: Anesthesiology

## 2016-07-22 ENCOUNTER — Encounter
Admission: RE | Disposition: A | Discharge status: Home / Self Care | Payer: Self-pay | Source: Ambulatory Visit | Attending: General Surgery

## 2016-07-22 ENCOUNTER — Ambulatory Visit: Payer: Medicare Other

## 2016-07-22 DIAGNOSIS — F909 Attention-deficit hyperactivity disorder, unspecified type: Secondary | ICD-10-CM | POA: Diagnosis not present

## 2016-07-22 DIAGNOSIS — Z8249 Family history of ischemic heart disease and other diseases of the circulatory system: Secondary | ICD-10-CM | POA: Diagnosis not present

## 2016-07-22 DIAGNOSIS — Z9889 Other specified postprocedural states: Secondary | ICD-10-CM | POA: Diagnosis not present

## 2016-07-22 DIAGNOSIS — I739 Peripheral vascular disease, unspecified: Secondary | ICD-10-CM | POA: Diagnosis not present

## 2016-07-22 DIAGNOSIS — M419 Scoliosis, unspecified: Secondary | ICD-10-CM | POA: Insufficient documentation

## 2016-07-22 DIAGNOSIS — Z8261 Family history of arthritis: Secondary | ICD-10-CM | POA: Diagnosis not present

## 2016-07-22 DIAGNOSIS — Z79899 Other long term (current) drug therapy: Secondary | ICD-10-CM | POA: Diagnosis not present

## 2016-07-22 DIAGNOSIS — F419 Anxiety disorder, unspecified: Secondary | ICD-10-CM | POA: Insufficient documentation

## 2016-07-22 DIAGNOSIS — Z95828 Presence of other vascular implants and grafts: Secondary | ICD-10-CM

## 2016-07-22 DIAGNOSIS — C8580 Other specified types of non-Hodgkin lymphoma, unspecified site: Secondary | ICD-10-CM | POA: Diagnosis not present

## 2016-07-22 DIAGNOSIS — Z825 Family history of asthma and other chronic lower respiratory diseases: Secondary | ICD-10-CM | POA: Insufficient documentation

## 2016-07-22 DIAGNOSIS — I10 Essential (primary) hypertension: Secondary | ICD-10-CM | POA: Diagnosis not present

## 2016-07-22 DIAGNOSIS — F329 Major depressive disorder, single episode, unspecified: Secondary | ICD-10-CM | POA: Insufficient documentation

## 2016-07-22 DIAGNOSIS — G35 Multiple sclerosis: Secondary | ICD-10-CM | POA: Diagnosis not present

## 2016-07-22 DIAGNOSIS — C8295 Follicular lymphoma, unspecified, lymph nodes of inguinal region and lower limb: Secondary | ICD-10-CM

## 2016-07-22 DIAGNOSIS — Z841 Family history of disorders of kidney and ureter: Secondary | ICD-10-CM | POA: Diagnosis not present

## 2016-07-22 DIAGNOSIS — Z811 Family history of alcohol abuse and dependence: Secondary | ICD-10-CM | POA: Insufficient documentation

## 2016-07-22 DIAGNOSIS — Z833 Family history of diabetes mellitus: Secondary | ICD-10-CM | POA: Diagnosis not present

## 2016-07-22 DIAGNOSIS — Z823 Family history of stroke: Secondary | ICD-10-CM | POA: Insufficient documentation

## 2016-07-22 DIAGNOSIS — Z7951 Long term (current) use of inhaled steroids: Secondary | ICD-10-CM | POA: Diagnosis not present

## 2016-07-22 DIAGNOSIS — Z87891 Personal history of nicotine dependence: Secondary | ICD-10-CM | POA: Insufficient documentation

## 2016-07-22 DIAGNOSIS — I129 Hypertensive chronic kidney disease with stage 1 through stage 4 chronic kidney disease, or unspecified chronic kidney disease: Secondary | ICD-10-CM | POA: Diagnosis not present

## 2016-07-22 DIAGNOSIS — J449 Chronic obstructive pulmonary disease, unspecified: Secondary | ICD-10-CM | POA: Insufficient documentation

## 2016-07-22 DIAGNOSIS — Z452 Encounter for adjustment and management of vascular access device: Secondary | ICD-10-CM | POA: Diagnosis not present

## 2016-07-22 DIAGNOSIS — Z9851 Tubal ligation status: Secondary | ICD-10-CM | POA: Insufficient documentation

## 2016-07-22 DIAGNOSIS — C8590 Non-Hodgkin lymphoma, unspecified, unspecified site: Secondary | ICD-10-CM | POA: Diagnosis not present

## 2016-07-22 DIAGNOSIS — N189 Chronic kidney disease, unspecified: Secondary | ICD-10-CM | POA: Diagnosis not present

## 2016-07-22 DIAGNOSIS — J9811 Atelectasis: Secondary | ICD-10-CM | POA: Diagnosis not present

## 2016-07-22 HISTORY — PX: PORTACATH PLACEMENT: SHX2246

## 2016-07-22 LAB — POCT I-STAT 4, (NA,K, GLUC, HGB,HCT)
Glucose, Bld: 99 mg/dL (ref 65–99)
HCT: 38 % (ref 36.0–46.0)
Hemoglobin: 12.9 g/dL (ref 12.0–15.0)
Potassium: 3.2 mmol/L — ABNORMAL LOW (ref 3.5–5.1)
Sodium: 141 mmol/L (ref 135–145)

## 2016-07-22 SURGERY — INSERTION, TUNNELED CENTRAL VENOUS DEVICE, WITH PORT
Anesthesia: Monitor Anesthesia Care

## 2016-07-22 MED ORDER — BUPIVACAINE HCL 0.5 % IJ SOLN
INTRAMUSCULAR | Status: DC | PRN
  Administered 2016-07-22: 20 mL

## 2016-07-22 MED ORDER — DEXMEDETOMIDINE HCL 200 MCG/2ML IV SOLN
INTRAVENOUS | Status: DC | PRN
Start: 2016-07-22 — End: 2016-07-22
  Administered 2016-07-22: 20 ug via INTRAVENOUS

## 2016-07-22 MED ORDER — SODIUM CHLORIDE 0.9 % IV SOLN
INTRAVENOUS | Status: DC | PRN
  Administered 2016-07-22: 30 mL via INTRAMUSCULAR

## 2016-07-22 MED ORDER — LIDOCAINE HCL (PF) 1 % IJ SOLN
INTRAMUSCULAR | Status: AC
  Filled 2016-07-22: qty 30

## 2016-07-22 MED ORDER — LACTATED RINGERS IV SOLN
INTRAVENOUS | Status: DC
  Administered 2016-07-22 (×2): via INTRAVENOUS

## 2016-07-22 MED ORDER — DEXMEDETOMIDINE HCL IN NACL 200 MCG/50ML IV SOLN
INTRAVENOUS | Status: AC
  Filled 2016-07-22: qty 100

## 2016-07-22 MED ORDER — GLYCOPYRROLATE 0.2 MG/ML IJ SOLN
INTRAMUSCULAR | Status: DC | PRN
  Administered 2016-07-22: 0.2 mg via INTRAVENOUS

## 2016-07-22 MED ORDER — LIDOCAINE HCL (PF) 2 % IJ SOLN
INTRAMUSCULAR | Status: AC
  Filled 2016-07-22: qty 2

## 2016-07-22 MED ORDER — FENTANYL CITRATE (PF) 100 MCG/2ML IJ SOLN
INTRAMUSCULAR | Status: AC
  Filled 2016-07-22: qty 2

## 2016-07-22 MED ORDER — CEFAZOLIN SODIUM-DEXTROSE 2-3 GM-% IV SOLR
INTRAVENOUS | Status: DC | PRN
  Administered 2016-07-22: 3 g via INTRAVENOUS

## 2016-07-22 MED ORDER — FENTANYL CITRATE (PF) 100 MCG/2ML IJ SOLN
INTRAMUSCULAR | Status: DC | PRN
  Administered 2016-07-22 (×2): 50 ug via INTRAVENOUS

## 2016-07-22 MED ORDER — GLYCOPYRROLATE 0.2 MG/ML IJ SOLN
INTRAMUSCULAR | Status: AC
  Filled 2016-07-22: qty 1

## 2016-07-22 MED ORDER — BUPIVACAINE HCL (PF) 0.5 % IJ SOLN
INTRAMUSCULAR | Status: AC
  Filled 2016-07-22: qty 30

## 2016-07-22 MED ORDER — ONDANSETRON HCL 4 MG/2ML IJ SOLN
INTRAMUSCULAR | Status: AC
  Filled 2016-07-22: qty 2

## 2016-07-22 MED ORDER — ONDANSETRON HCL 4 MG/2ML IJ SOLN: 4.0000 mg | Freq: Once | INTRAMUSCULAR | Status: DC | PRN

## 2016-07-22 MED ORDER — CHLORHEXIDINE GLUCONATE CLOTH 2 % EX PADS: 6.0000 | MEDICATED_PAD | Freq: Once | CUTANEOUS | Status: DC

## 2016-07-22 MED ORDER — PROPOFOL 500 MG/50ML IV EMUL
INTRAVENOUS | Status: DC | PRN
  Administered 2016-07-22: 25 ug/kg/min via INTRAVENOUS

## 2016-07-22 MED ORDER — MIDAZOLAM HCL 5 MG/5ML IJ SOLN
INTRAMUSCULAR | Status: DC | PRN
  Administered 2016-07-22 (×2): 1 mg via INTRAVENOUS

## 2016-07-22 MED ORDER — FENTANYL CITRATE (PF) 100 MCG/2ML IJ SOLN: 25.0000 ug | INTRAMUSCULAR | Status: DC | PRN

## 2016-07-22 MED ORDER — MIDAZOLAM HCL 2 MG/2ML IJ SOLN
INTRAMUSCULAR | Status: AC
  Filled 2016-07-22: qty 2

## 2016-07-22 MED ORDER — PROPOFOL 500 MG/50ML IV EMUL
INTRAVENOUS | Status: AC
  Filled 2016-07-22: qty 50

## 2016-07-22 MED ORDER — HEPARIN SODIUM (PORCINE) 5000 UNIT/ML IJ SOLN
INTRAMUSCULAR | Status: AC
  Filled 2016-07-22: qty 1

## 2016-07-22 MED ORDER — DEXAMETHASONE SODIUM PHOSPHATE 10 MG/ML IJ SOLN
INTRAMUSCULAR | Status: AC
  Filled 2016-07-22: qty 1

## 2016-07-22 MED ORDER — FAMOTIDINE 20 MG PO TABS: 20.0000 mg | ORAL_TABLET | Freq: Once | ORAL | Status: DC

## 2016-07-22 SURGICAL SUPPLY — 30 items
BAG DECANTER FOR FLEXI CONT (MISCELLANEOUS) ×2 IMPLANT
BLADE SURG 15 STRL SS SAFETY (BLADE) ×2 IMPLANT
CANISTER SUCT 1200ML W/VALVE (MISCELLANEOUS) ×2 IMPLANT
CHLORAPREP W/TINT 26ML (MISCELLANEOUS) ×2 IMPLANT
COVER LIGHT HANDLE STERIS (MISCELLANEOUS) ×4 IMPLANT
DECANTER SPIKE VIAL GLASS SM (MISCELLANEOUS) ×4 IMPLANT
DERMABOND ADVANCED (GAUZE/BANDAGES/DRESSINGS) ×1
DERMABOND ADVANCED .7 DNX12 (GAUZE/BANDAGES/DRESSINGS) ×1 IMPLANT
DRAPE C-ARM XRAY 36X54 (DRAPES) ×2 IMPLANT
ELECT REM PT RETURN 9FT ADLT (ELECTROSURGICAL) ×2
ELECTRODE REM PT RTRN 9FT ADLT (ELECTROSURGICAL) ×1 IMPLANT
GLOVE BIO SURGEON STRL SZ7 (GLOVE) ×4 IMPLANT
GOWN STRL REUS W/ TWL LRG LVL3 (GOWN DISPOSABLE) ×2 IMPLANT
GOWN STRL REUS W/TWL LRG LVL3 (GOWN DISPOSABLE) ×2
IV NS 500ML (IV SOLUTION) ×1
IV NS 500ML BAXH (IV SOLUTION) ×1 IMPLANT
KIT PORT POWER 8FR ISP CVUE (Catheter) ×2 IMPLANT
KIT RM TURNOVER STRD PROC AR (KITS) ×2 IMPLANT
LABEL OR SOLS (LABEL) ×2 IMPLANT
NEEDLE FILTER BLUNT 18X 1/2SAF (NEEDLE) ×1
NEEDLE FILTER BLUNT 18X1 1/2 (NEEDLE) ×1 IMPLANT
NEEDLE HYPO 25GX1X1/2 BEV (NEEDLE) ×2 IMPLANT
NS IRRIG 500ML POUR BTL (IV SOLUTION) ×2 IMPLANT
PACK PORT-A-CATH (MISCELLANEOUS) ×2 IMPLANT
STRAP SAFETY BODY (MISCELLANEOUS) ×2 IMPLANT
SUT PROLENE 2 0 SH DA (SUTURE) ×2 IMPLANT
SUT VIC AB 3-0 SH 27 (SUTURE) ×1
SUT VIC AB 3-0 SH 27X BRD (SUTURE) ×1 IMPLANT
SUT VIC AB 4-0 FS2 27 (SUTURE) ×2 IMPLANT
SYR 3ML LL SCALE MARK (SYRINGE) ×2 IMPLANT

## 2016-07-22 NOTE — Op Note (Signed)
Preop diagnosis: Lymphoma  Post op diagnosis: Same  Operation: Insertion venous access port with ultrasound and fluoroscopic guidance  Surgeon: Mckinley Jewel  Assistant:    Anesthesia: Monitored anesthesia care 20 mL of 0.5% Marcaine mixed with 1% Xylocaine used  Complications: None  EBL: Less than 5 mL  Drains: None  Description: Patient was placed supine on the operating table and thereafter in slight Trendelenburg. With adequate sedation and monitoring the left upper chest and neck area were prepped and draped as sterile field. Timeout was. Ultrasound probe was brought up and the left subclavian vein was noted be extremely small and unsuitable. The left internal jugular was easily identified and after instillation of local anesthetic with ultrasound guidance the needle was successfully positioned in the internal jugular vein. Following this the fluoroscopic guidance on the Seldinger technique catheter was positioned going towards the distal SVC. Skin mark was approximately of the 20-23 cm. Subcutaneous port side was made over the incision after instillation of local anesthetic in the second interspace area. Incision was made and subcutaneous tissues pocket created with the cautery. The catheter was tunneled through to the site cut to approximate length and fixed to the prefilled port. Port was then placed in the pocket and anchored with 3 stitches of 2-0 Prolene. Fluoroscopy was repeated to ensure a smooth course of the catheter and the tip in the distal SVC right atrial region. The port was flushed through with heparinized saline. Subcutaneous tissue closed with 3-0 Vicryl and the skin with subcuticular 4-0 Vicryl covered with Dermabond. Procedure was well-tolerated and she was subsequently returned recovery room stable condition

## 2016-07-22 NOTE — Discharge Instructions (Signed)

## 2016-07-22 NOTE — H&P (View-Only) (Signed)
Patient ID: [name], female   DOB: [birth year], 66 y.o.   MRN: [id number]  Chief Complaint  Patient presents with  . Routine Post Op  . Follow-up    HPI [name] is a 66 y.o. female here today for her follow up left inguinal lymph node biopsy done on 07/04/2016, also here today to discuss port placement.  I have reviewed the history of present illness with the patient.  HPI  Past Medical History:  Diagnosis Date  . ADHD   . Anxiety   . COPD (chronic obstructive pulmonary disease) ([place])   . Depression    major depressive  . Dyspnea    doe  . Edema    left leg  . Follicular lymphoma ([place])    B Cell  . Hypertension   . Hypotension    idiopathic  . Kyphoscoliosis and scoliosis 11/26/2011  . Morbid obesity ([place]) 01/05/2016  . Multiple sclerosis ([place])   . Multiple sclerosis ([place])    1980's  . Neuromuscular disorder ([place])   . Obstructive and reflux uropathy    foley  . Pain    atypical facial  . Peripheral vascular disease of lower extremity with ulceration ([place]) 11/08/2015  . Skin ulcer ([place]) 11/08/2015  . Weakness    generalized. has MS    Past Surgical History:  Procedure Laterality Date  . BACK SURGERY N/A 2002  . CYST EXCISION     lower back  . INGUINAL LYMPH NODE BIOPSY Left 07/04/2016   Procedure: INGUINAL LYMPH NODE BIOPSY;  Surgeon: Christene Lye, MD;  Location: [facility] ORS;  Service: General;  Laterality: Left;  . TONSILLECTOMY AND ADENOIDECTOMY    . TUBAL LIGATION      Family History  Problem Relation Age of Onset  . COPD Mother   . Diabetes Mother   . Heart failure Mother   . Alcohol abuse Father   . Kidney disease Father   . Kidney failure Father   . Arthritis Sister   . CAD Maternal Grandmother   . Stroke Maternal Grandfather   . Diabetes Sister   . Arthritis Sister   . Arthritis Brother     Social History Social History  Substance Use Topics  . Smoking status: Former Smoker    Packs/day: 1.00    Years: 20.00    Types:  Cigarettes    Quit date: 02/03/2016  . Smokeless tobacco: Never Used  . Alcohol use No    No Known Allergies  No current facility-administered medications for this visit.    No current outpatient prescriptions on file.   Facility-Administered Medications Ordered in Other Visits  Medication Dose Route Frequency Provider Last Rate Last Dose  . ceFAZolin (ANCEF) 3 g in dextrose 5 % 50 mL IVPB  3 g Intravenous On Call to [place], MD      . Chlorhexidine Gluconate Cloth 2 % PADS 6 each  6 each Topical Once Seeplaputhur Robinette Haines, MD      . famotidine (PEPCID) tablet 20 mg  20 mg Oral Once Martha Clan, MD      . lactated ringers infusion   Intravenous Continuous Martha Clan, MD        Review of Systems Review of Systems  Constitutional: Negative.   Respiratory: Negative.   Cardiovascular: Negative.     Blood pressure (!) 144/88, pulse 86, resp. rate 18, height 5\' 3"  (1.6 m), weight (!) 312 lb (141.5 kg).  Physical Exam Physical Exam  Constitutional:  She is oriented to person, place, and time. She appears well-developed and well-nourished.  HENT:  Mouth/Throat: Oropharynx is clear and moist.  Eyes: Conjunctivae are normal. No scleral icterus.  Neck: Neck supple.  Cardiovascular: Normal rate, regular rhythm and normal heart sounds.   Pulmonary/Chest: Effort normal and breath sounds normal.  Abdominal: Soft. There is no tenderness.  Lymphadenopathy:    She has no cervical adenopathy.  Neurological: She is alert and oriented to person, place, and time.  Skin: Skin is warm and dry.  Psychiatric: Her behavior is normal.  Left groin incision is clean and healing well  Data Reviewed Path report  Assessment  B cell lymphoma     Plan    Patient has been seen by oncology and has a PET scan scheduled for next week. Chemo to start the week after.     Discussed port placement with patient. Procedure explained and she is agreeable. PET scan 07-16-16 Port  placement is scheduled on 07/22/2016.  This information has been scribed by Gaspar Cola CMA.  SANKAR,SEEPLAPUTHUR G 07/22/2016, 8:42 AM

## 2016-07-22 NOTE — Progress Notes (Signed)
Discharged with foley catheter intact

## 2016-07-22 NOTE — Anesthesia Preprocedure Evaluation (Signed)
Anesthesia Evaluation  Patient identified by MRN, date of birth, ID band Patient awake    Reviewed: Allergy & Precautions, H&P , NPO status , Patient's Chart, lab work & pertinent test results, reviewed documented beta blocker date and time   Airway Mallampati: III  TM Distance: >3 FB Neck ROM: full    Dental no notable dental hx. (+) Teeth Intact   Pulmonary neg pulmonary ROS, shortness of breath and with exertion, sleep apnea and Continuous Positive Airway Pressure Ventilation , COPD,  COPD inhaler, former smoker,    Pulmonary exam normal breath sounds clear to auscultation       Cardiovascular Exercise Tolerance: Good hypertension, + Peripheral Vascular Disease  negative cardio ROS   Rhythm:regular Rate:Normal     Neuro/Psych  Headaches, PSYCHIATRIC DISORDERS  Neuromuscular disease negative neurological ROS  negative psych ROS   GI/Hepatic negative GI ROS, Neg liver ROS,   Endo/Other  negative endocrine ROSdiabetes  Renal/GU CRFRenal disease     Musculoskeletal   Abdominal   Peds  Hematology negative hematology ROS (+)   Anesthesia Other Findings   Reproductive/Obstetrics negative OB ROS                             Anesthesia Physical Anesthesia Plan  ASA: IV  Anesthesia Plan: MAC   Post-op Pain Management:    Induction:   Airway Management Planned:   Additional Equipment:   Intra-op Plan:   Post-operative Plan:   Informed Consent: I have reviewed the patients History and Physical, chart, labs and discussed the procedure including the risks, benefits and alternatives for the proposed anesthesia with the patient or authorized representative who has indicated his/her understanding and acceptance.     Plan Discussed with: CRNA  Anesthesia Plan Comments:         Anesthesia Quick Evaluation

## 2016-07-22 NOTE — Progress Notes (Signed)
Patient has indwelling foley catheter in place on arrival To same day surgery.  Dr. Andree Elk made aware via Phone of potassium result of 3.2.  No further orders.

## 2016-07-22 NOTE — Interval H&P Note (Signed)
History and Physical Interval Note:  07/22/2016 8:42 AM  [name]  has presented today for surgery, with the diagnosis of lymphoma  The various methods of treatment have been discussed with the patient and family. After consideration of risks, benefits and other options for treatment, the patient has consented to  Procedure(s): INSERTION PORT-A-CATH (N/A) as a surgical intervention .  The patient's history has been reviewed, patient examined, no change in status, stable for surgery.  I have reviewed the patient's chart and labs.  Questions were answered to the patient's satisfaction.     Islam Villescas G

## 2016-07-22 NOTE — Anesthesia Post-op Follow-up Note (Cosign Needed)
Anesthesia QCDR form completed.        

## 2016-07-22 NOTE — Transfer of Care (Signed)
Immediate Anesthesia Transfer of Care Note  Patient: [name]  Procedure(s) Performed: Procedure(s): INSERTION PORT-A-CATH (N/A)  Patient Location: PACU  Anesthesia Type:MAC  Level of Consciousness: awake, alert  and oriented  Airway & Oxygen Therapy: Patient Spontanous Breathing  Post-op Assessment: Report given to RN and Post -op Vital signs reviewed and stable  Post vital signs: Reviewed and stable  Last Vitals:  Vitals:   07/22/16 0837  BP: 127/64  Pulse: 74  Resp: (!) 22  Temp: 36.8 C    Last Pain:  Vitals:   07/22/16 0837  TempSrc: Tympanic         Complications: No apparent anesthesia complications

## 2016-07-23 ENCOUNTER — Encounter: Payer: Self-pay | Admitting: General Surgery

## 2016-07-24 ENCOUNTER — Telehealth: Payer: Self-pay | Admitting: Family Medicine

## 2016-07-24 DIAGNOSIS — G43909 Migraine, unspecified, not intractable, without status migrainosus: Secondary | ICD-10-CM | POA: Diagnosis not present

## 2016-07-24 DIAGNOSIS — I950 Idiopathic hypotension: Secondary | ICD-10-CM | POA: Diagnosis not present

## 2016-07-24 DIAGNOSIS — I2729 Other secondary pulmonary hypertension: Secondary | ICD-10-CM | POA: Diagnosis not present

## 2016-07-24 DIAGNOSIS — G501 Atypical facial pain: Secondary | ICD-10-CM | POA: Diagnosis not present

## 2016-07-24 DIAGNOSIS — J449 Chronic obstructive pulmonary disease, unspecified: Secondary | ICD-10-CM | POA: Diagnosis not present

## 2016-07-24 DIAGNOSIS — I739 Peripheral vascular disease, unspecified: Secondary | ICD-10-CM | POA: Diagnosis not present

## 2016-07-24 DIAGNOSIS — M5416 Radiculopathy, lumbar region: Secondary | ICD-10-CM | POA: Diagnosis not present

## 2016-07-24 DIAGNOSIS — G629 Polyneuropathy, unspecified: Secondary | ICD-10-CM | POA: Diagnosis not present

## 2016-07-24 DIAGNOSIS — G35 Multiple sclerosis: Secondary | ICD-10-CM | POA: Diagnosis not present

## 2016-07-24 DIAGNOSIS — I10 Essential (primary) hypertension: Secondary | ICD-10-CM | POA: Diagnosis not present

## 2016-07-24 DIAGNOSIS — G4733 Obstructive sleep apnea (adult) (pediatric): Secondary | ICD-10-CM | POA: Diagnosis not present

## 2016-07-24 DIAGNOSIS — M1991 Primary osteoarthritis, unspecified site: Secondary | ICD-10-CM | POA: Diagnosis not present

## 2016-07-24 NOTE — Telephone Encounter (Signed)
That's fine Please write out, diagnosis lymphoma, weakness Thank you

## 2016-07-24 NOTE — Telephone Encounter (Signed)
PER DANIELL GATES ( [phone])[facility] [place] NEEDS AN ORDER FOR LIFT CHAIR. IF THIS ORDER COULD BE FAXED TO ANY DURABLE MEDICAL COMPANY FOR THIS CHAIR OF DR CHOICE.

## 2016-07-24 NOTE — Anesthesia Postprocedure Evaluation (Signed)
Anesthesia Post Note  Patient: [name]  Procedure(s) Performed: Procedure(s) (LRB): INSERTION PORT-A-CATH (N/A)  Patient location during evaluation: PACU Anesthesia Type: MAC Level of consciousness: awake and alert Pain management: pain level controlled Vital Signs Assessment: post-procedure vital signs reviewed and stable Respiratory status: spontaneous breathing, nonlabored ventilation, respiratory function stable and patient connected to nasal cannula oxygen Cardiovascular status: blood pressure returned to baseline and stable Postop Assessment: no signs of nausea or vomiting Anesthetic complications: no     Last Vitals:  Vitals:   07/22/16 1051 07/22/16 1118  BP: 114/60 124/68  Pulse: (!) 59 69  Resp: 14   Temp: (!) 35.9 C     Last Pain:  Vitals:   07/22/16 1051  TempSrc: Temporal  PainSc: 0-No pain                 Molli Barrows

## 2016-07-25 ENCOUNTER — Emergency Department
Admission: EM | Admit: 2016-07-25 | Discharge: 2016-07-25 | Disposition: A | Discharge status: Home / Self Care | Payer: Medicare Other | Attending: Emergency Medicine | Admitting: Emergency Medicine

## 2016-07-25 ENCOUNTER — Inpatient Hospital Stay: Payer: Medicare Other | Admitting: Oncology

## 2016-07-25 ENCOUNTER — Encounter: Payer: Self-pay | Admitting: Emergency Medicine

## 2016-07-25 ENCOUNTER — Emergency Department: Payer: Medicare Other

## 2016-07-25 DIAGNOSIS — J449 Chronic obstructive pulmonary disease, unspecified: Secondary | ICD-10-CM | POA: Insufficient documentation

## 2016-07-25 DIAGNOSIS — F909 Attention-deficit hyperactivity disorder, unspecified type: Secondary | ICD-10-CM | POA: Diagnosis not present

## 2016-07-25 DIAGNOSIS — Y9301 Activity, walking, marching and hiking: Secondary | ICD-10-CM | POA: Insufficient documentation

## 2016-07-25 DIAGNOSIS — Z87891 Personal history of nicotine dependence: Secondary | ICD-10-CM | POA: Diagnosis not present

## 2016-07-25 DIAGNOSIS — S80211A Abrasion, right knee, initial encounter: Secondary | ICD-10-CM | POA: Diagnosis not present

## 2016-07-25 DIAGNOSIS — W109XXA Fall (on) (from) unspecified stairs and steps, initial encounter: Secondary | ICD-10-CM | POA: Insufficient documentation

## 2016-07-25 DIAGNOSIS — S8992XA Unspecified injury of left lower leg, initial encounter: Secondary | ICD-10-CM | POA: Diagnosis not present

## 2016-07-25 DIAGNOSIS — Z79899 Other long term (current) drug therapy: Secondary | ICD-10-CM | POA: Diagnosis not present

## 2016-07-25 DIAGNOSIS — N183 Chronic kidney disease, stage 3 (moderate): Secondary | ICD-10-CM | POA: Diagnosis not present

## 2016-07-25 DIAGNOSIS — Y92009 Unspecified place in unspecified non-institutional (private) residence as the place of occurrence of the external cause: Secondary | ICD-10-CM | POA: Insufficient documentation

## 2016-07-25 DIAGNOSIS — Y999 Unspecified external cause status: Secondary | ICD-10-CM | POA: Diagnosis not present

## 2016-07-25 DIAGNOSIS — M25561 Pain in right knee: Secondary | ICD-10-CM | POA: Diagnosis not present

## 2016-07-25 DIAGNOSIS — I129 Hypertensive chronic kidney disease with stage 1 through stage 4 chronic kidney disease, or unspecified chronic kidney disease: Secondary | ICD-10-CM | POA: Insufficient documentation

## 2016-07-25 DIAGNOSIS — W1830XA Fall on same level, unspecified, initial encounter: Secondary | ICD-10-CM | POA: Diagnosis not present

## 2016-07-25 DIAGNOSIS — R600 Localized edema: Secondary | ICD-10-CM | POA: Diagnosis not present

## 2016-07-25 DIAGNOSIS — M6281 Muscle weakness (generalized): Secondary | ICD-10-CM | POA: Diagnosis not present

## 2016-07-25 DIAGNOSIS — R296 Repeated falls: Secondary | ICD-10-CM

## 2016-07-25 DIAGNOSIS — S8991XA Unspecified injury of right lower leg, initial encounter: Secondary | ICD-10-CM | POA: Diagnosis not present

## 2016-07-25 DIAGNOSIS — R2242 Localized swelling, mass and lump, left lower limb: Secondary | ICD-10-CM | POA: Diagnosis not present

## 2016-07-25 LAB — BASIC METABOLIC PANEL
Anion gap: 7 (ref 5–15)
BUN: 19 mg/dL (ref 6–20)
CO2: 29 mmol/L (ref 22–32)
Calcium: 9.1 mg/dL (ref 8.9–10.3)
Chloride: 101 mmol/L (ref 101–111)
Creatinine, Ser: 1.5 mg/dL — ABNORMAL HIGH (ref 0.44–1.00)
GFR calc Af Amer: 41 mL/min — ABNORMAL LOW (ref >60)
GFR calc non Af Amer: 35 mL/min — ABNORMAL LOW (ref >60)
Glucose, Bld: 97 mg/dL (ref 65–99)
Potassium: 3.4 mmol/L — ABNORMAL LOW (ref 3.5–5.1)
Sodium: 137 mmol/L (ref 135–145)

## 2016-07-25 LAB — CBC
HCT: 36.7 % (ref 35.0–47.0)
Hemoglobin: 12.6 g/dL (ref 12.0–16.0)
MCH: 28.7 pg (ref 26.0–34.0)
MCHC: 34.4 g/dL (ref 32.0–36.0)
MCV: 83.5 fL (ref 80.0–100.0)
Platelets: 235 10*3/uL (ref 150–440)
RBC: 4.4 MIL/uL (ref 3.80–5.20)
RDW: 14.6 % — ABNORMAL HIGH (ref 11.5–14.5)
WBC: 6.5 10*3/uL (ref 3.6–11.0)

## 2016-07-25 MED ORDER — TRAMADOL HCL 50 MG PO TABS
50.0000 mg | ORAL_TABLET | ORAL | Status: AC
  Administered 2016-07-25: 50 mg via ORAL
  Filled 2016-07-25: qty 1

## 2016-07-25 MED ORDER — CEPHALEXIN 500 MG PO CAPS: 500.0000 mg | ORAL_CAPSULE | Freq: Two times a day (BID) | ORAL | 0 refills | Status: DC

## 2016-07-25 NOTE — Discharge Instructions (Signed)
You have been seen in the Emergency Department (ED) today for a fall.  Your work up does not show any concerning injuries.   Please follow up with your doctor regarding today's Emergency Department (ED) visit and your recent fall.    Return to the ED if you have any headache, confusion, slurred speech, weakness/numbness of any arm or leg, or any increased pain.  -------------------- You have been seen today in the Emergency Department (ED) for cellulitis, a superficial skin infection. Please take your antibiotics as prescribed for their ENTIRE prescribed duration.    Please follow up with your doctor (including your home health nurse team and wound care center) within 48 hours for recheck of your infection if you are not improving.  Call your doctor sooner or return to the ED if you develop worsening signs of infection such as: increased redness, increased pain, pus, fever, or other symptoms that concern you.

## 2016-07-25 NOTE — ED Provider Notes (Signed)
[facility] Emergency Department Provider Note   ____________________________________________   First MD Initiated Contact with Patient 07/25/16 1535     (approximate)  I have reviewed the triage vital signs and the nursing notes.   HISTORY  Chief Complaint Weakness    HPI [name] is a 66 y.o. female history of multiple sclerosis as well as a recent diagnosis of lymphoma and chronic wound involving the left lower leg.  Patient reports that she has very bad balance and has difficulty walking due to her MS. She's not experienced any new weakness, but has chronic problems with coordination. She was walking out of her home today, she reached the last step and it was loose, caused her to "get all twisted up" and she fell onto her right knee. She did not strike her head. She was able to catch her fall. She is not having any severe pain but reports she had a "abrasion" the right knee. In addition, no chest pain no trouble breathing. No new weakness numbness or tingling.  She was on her way to a doctor's appointment today, but because she fell to ground her family called EMS to assist them in getting her up, she was then brought here for further evaluation.  She does report her left leg is always red, she has a nurse who saw her yesterday and last perform dressing change, and she follows with the wound clinic. On January she had an ultrasound and there was no blood clot, and reports that the swelling in the left leg is chronic.   Past Medical History:  Diagnosis Date  . ADHD   . Anxiety   . COPD (chronic obstructive pulmonary disease) ([place])   . Depression    major depressive  . Dyspnea    doe  . Edema    left leg  . Follicular lymphoma ([place])    B Cell  . Hypertension   . Hypotension    idiopathic  . Kyphoscoliosis and scoliosis 11/26/2011  . Morbid obesity ([place]) 01/05/2016  . Multiple sclerosis ([place])   . Multiple sclerosis ([place])    1980's  .  Neuromuscular disorder ([place])   . Obstructive and reflux uropathy    foley  . Pain    atypical facial  . Peripheral vascular disease of lower extremity with ulceration ([place]) 11/08/2015  . Skin ulcer ([place]) 11/08/2015  . Weakness    generalized. has MS    Patient Active Problem List   Diagnosis Date Noted  . Pelvic mass in female 06/18/2016  . Multiple falls 06/07/2016  . Inguinal adenopathy 05/29/2016  . Swelling of nose 01/07/2016  . Foreign body sensation in left ear canal 01/07/2016  . Breast cancer screening 01/05/2016  . Morbid obesity ([place]) 01/05/2016  . Pressure ulcer 12/19/2015  . Low HDL (under 40) 12/19/2015  . Cellulitis 12/18/2015  . Skin ulcer ([place]) 11/08/2015  . Peripheral vascular disease of lower extremity with ulceration ([place]) 11/08/2015  . CKD (chronic kidney disease) stage 3, GFR 30-59 ml/min 11/08/2015  . Decubitus ulcer of left thigh, stage 2 04/18/2015  . Constipation due to pain medication 01/31/2015  . Obstructive sleep apnea of adult 01/13/2015  . Pelvic muscle wasting 01/13/2015  . Incomplete bladder emptying 01/13/2015  . Headache, migraine 10/24/2014  . 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 Current tobacco use 10/24/2014  . Lumbar radiculopathy, chronic 10/02/2013  . COPD with bronchial hyperresponsiveness ([place]) 10/02/2013  . Major depressive disorder, recurrent, in partial remission ([place]) 10/02/2013  .  Hypertension goal BP (blood pressure) < 140/90 10/02/2013  . Multiple sclerosis ([place]) 10/02/2013  . Absence of bladder continence 09/25/2012  . Acontractile bladder 02/12/2012  . Bladder retention 02/12/2012  . Narrowing of intervertebral disc space 11/26/2011  . Kyphoscoliosis and scoliosis 11/26/2011    Past Surgical History:  Procedure Laterality Date  . BACK SURGERY N/A 2002  . CYST EXCISION     lower back  . INGUINAL LYMPH NODE BIOPSY Left 07/04/2016   Procedure: INGUINAL LYMPH NODE BIOPSY;  Surgeon: Christene Lye, MD;  Location: [facility]  ORS;  Service: General;  Laterality: Left;  . PORTACATH PLACEMENT N/A 07/22/2016   Procedure: INSERTION PORT-A-CATH;  Surgeon: Christene Lye, MD;  Location: [facility] ORS;  Service: General;  Laterality: N/A;  . TONSILLECTOMY AND ADENOIDECTOMY    . TUBAL LIGATION      Prior to Admission medications   Medication Sig Start Date End Date Taking? Authorizing Provider  AVONEX PEN 30 MCG/0.5ML AJKT Inject 30 mcg into the skin every Monday. 04/28/16  Yes Historical Provider, MD  buPROPion (WELLBUTRIN XL) 300 MG 24 hr tablet Take 300 mg by mouth every morning.  05/28/16  Yes Historical Provider, MD  clonazePAM (KLONOPIN) 1 MG tablet Take 1 tablet by mouth at bedtime.  07/12/13  Yes Historical Provider, MD  Dalfampridine (AMPYRA PO) Take by mouth daily.   Yes Historical Provider, MD  DULoxetine (CYMBALTA) 60 MG capsule Take 60 mg by mouth every morning.  07/26/13  Yes Historical Provider, MD  fluticasone furoate-vilanterol (BREO ELLIPTA) 100-25 MCG/INH AEPB Inhale 1 puff into the lungs daily. 01/05/16  Yes Arnetha Courser, MD  gabapentin (NEURONTIN) 600 MG tablet Take 1 tablet (600 mg total) by mouth 3 (three) times daily. 07/04/16  Yes Seeplaputhur Robinette Haines, MD  hydrochlorothiazide (MICROZIDE) 12.5 MG capsule Take 12.5 mg by mouth daily. 07/11/16  Yes Historical Provider, MD  methylphenidate (RITALIN) 10 MG tablet Take 10 mg by mouth 2 (two) times daily.  01/23/15  Yes Historical Provider, MD  Multiple Vitamin (MULTIVITAMIN WITH MINERALS) TABS tablet Take 1 tablet by mouth daily.   Yes Historical Provider, MD  polyethylene glycol powder (GLYCOLAX/MIRALAX) powder Take 17 g by mouth daily. Mixed in water Patient taking differently: Take 17 g by mouth as needed. Mixed in water 03/11/16  Yes Arnetha Courser, MD  potassium chloride (K-DUR) 10 MEQ tablet Take 1 tablet (10 mEq total) by mouth daily. 12/22/15  Yes Arnetha Courser, MD  QUEtiapine (SEROQUEL) 100 MG tablet Take 100 mg by mouth at bedtime. 07/23/16  Yes  Historical Provider, MD  solifenacin (VESICARE) 5 MG tablet Take 5 mg by mouth daily.   Yes Historical Provider, MD  tiotropium (SPIRIVA) 18 MCG inhalation capsule Place 18 mcg into inhaler and inhale daily.   Yes Historical Provider, MD  tiZANidine (ZANAFLEX) 4 MG tablet Take 4 mg by mouth 4 (four) times daily. TAKES FOR MS   Yes Historical Provider, MD  topiramate (TOPAMAX) 50 MG tablet Take 1 tablet (50 mg total) by mouth 2 (two) times daily. 05/18/16  Yes Arnetha Courser, MD  traMADol (ULTRAM) 50 MG tablet Take 1 tablet (50 mg total) by mouth every 6 (six) hours as needed. Patient taking differently: Take 50 mg by mouth every 6 (six) hours as needed (for pain).  02/28/16  Yes Daymon Larsen, MD  triamterene-hydrochlorothiazide (MAXZIDE) 75-50 MG tablet Take 1 tablet by mouth daily.   Yes Historical Provider, MD  cephALEXin (KEFLEX) 500 MG capsule Take  1 capsule (500 mg total) by mouth 2 (two) times daily. 07/25/16   Delman Kitten, MD    Allergies Patient has no known allergies.  Family History  Problem Relation Age of Onset  . COPD Mother   . Diabetes Mother   . Heart failure Mother   . Alcohol abuse Father   . Kidney disease Father   . Kidney failure Father   . Arthritis Sister   . CAD Maternal Grandmother   . Stroke Maternal Grandfather   . Diabetes Sister   . Arthritis Sister   . Arthritis Brother     Social History Social History  Substance Use Topics  . Smoking status: Former Smoker    Packs/day: 1.00    Years: 20.00    Types: Cigarettes    Quit date: 02/03/2016  . Smokeless tobacco: Never Used  . Alcohol use No    Review of Systems Constitutional: No fever/chills Eyes: No visual changes. ENT: No sore throat. Cardiovascular: Denies chest pain. Respiratory: Denies shortness of breath. Gastrointestinal: No abdominal pain.  No nausea, no vomiting.  No diarrhea.  No constipation. Genitourinary: Negative for dysuria.Has a chronic indwelling catheter. She has not noticed  any new changes, sediment, or darkness. Musculoskeletal: Negative for back pain. No neck pain. Her left leg is always swollen, denies any injury. No injury to her arms. She does report a small abrasion to her right knee and is "sore" around the knee but denies any pain in the rest the leg including hips. She is using a walker at baseline. Skin: Negative for rash. Neurological: Negative for headaches, focal weakness or numbness.  10-point ROS otherwise negative.  ____________________________________________   PHYSICAL EXAM:  VITAL SIGNS: ED Triage Vitals  Enc Vitals Group     BP 07/25/16 1524 136/67     Pulse Rate 07/25/16 1524 68     Resp --      Temp 07/25/16 1524 97.8 F (36.6 C)     Temp Source 07/25/16 1524 Oral     SpO2 07/25/16 1524 96 %     Weight 07/25/16 1525 284 lb (128.8 kg)     Height 07/25/16 1525 5\' 3"  (1.6 m)     Head Circumference --      [facility] Flow --      Pain Score 07/25/16 1551 4     Pain Loc --      Pain Edu? --      Excl. in [place]? --     Constitutional: Alert and oriented. Well appearing and in no acute distress.She is very pleasant Eyes: Conjunctivae are normal. PERRL. EOMI. Head: Atraumatic. Nose: No congestion/rhinnorhea. Mouth/Throat: Mucous membranes are moist.  Oropharynx non-erythematous. Neck: No stridor.   No cervical tenderness. Emotional neck without pain. Cardiovascular: Normal rate, regular rhythm. Grossly normal heart sounds.  Good peripheral circulation. Palpable dorsalis pedis pulses in lower extremity is bilateral Respiratory: Normal respiratory effort.  No retractions. Lungs CTAB. Gastrointestinal: Soft and nontender. No distention.  Musculoskeletal: No lower extremity tenderness nor edema. Neurologic:  Normal speech and language. No gross focal neurologic deficits are appreciated. She does seem to have somewhat diminished strength in the lower extremities bilaterally, approximately 4 out of 5. Patient reports this is chronic and due to  her MS. Denies any new problems. Skin:  Skin is warm, dry and intact except for a very small and superficial abrasion over the right inferior knee joint anteriorly without deformity or effusion. No rash noted except for moderate blanching erythema from the  left distal one third of the thigh down throughout the foot without any open wound or gangrenous appearing material. No crepitus. The left lower thigh is notably edematous compared to the right, and the patient reports this is chronic.Marland Kitchen Psychiatric: Mood and affect are normal. Speech and behavior are normal.  ____________________________________________   LABS (all labs ordered are listed, but only abnormal results are displayed)  Labs Reviewed  BASIC METABOLIC PANEL - Abnormal; Notable for the following:       Result Value   Potassium 3.4 (*)    Creatinine, Ser 1.50 (*)    GFR calc non Af Amer 35 (*)    GFR calc Af Amer 41 (*)    All other components within normal limits  CBC - Abnormal; Notable for the following:    RDW 14.6 (*)    All other components within normal limits   ____________________________________________  EKG  Reviewed and interpreted by me at 1530 Heart rate 70 QRS 110 QTc 440 Normal sinus rhythm, no evidence of ischemia or ectopy. Q waves noted in inferior distribution. No significant change from 02/28/2016's previous 12-lead. ____________________________________________  RADIOLOGY  US Venous Img Lower Unilateral Left  Result Date: 07/25/2016 CLINICAL DATA:  Patient fell on right knee.  Swelling. EXAM: Left  LOWER EXTREMITY VENOUS DOPPLER ULTRASOUND TECHNIQUE: Gray-scale sonography with graded compression, as well as color Doppler and duplex ultrasound were performed to evaluate the lower extremity deep venous systems from the level of the common femoral vein and including the common femoral, femoral, profunda femoral, popliteal and calf veins including the posterior tibial, peroneal and gastrocnemius veins  when visible. The superficial great saphenous vein was also interrogated. Spectral Doppler was utilized to evaluate flow at rest and with distal augmentation maneuvers in the common femoral, femoral and popliteal veins. COMPARISON:  02/28/2016 FINDINGS: Contralateral Common Femoral Vein: Respiratory phasicity is normal and symmetric with the symptomatic side. No evidence of thrombus. Normal compressibility. Common Femoral Vein: No evidence of thrombus. Normal compressibility, respiratory phasicity and response to augmentation. Saphenofemoral Junction: No evidence of thrombus. Normal compressibility and flow on color Doppler imaging. Profunda Femoral Vein: No evidence of thrombus. Normal compressibility and flow on color Doppler imaging. Femoral Vein: No evidence of thrombus. Normal compressibility, respiratory phasicity and response to augmentation. Popliteal Vein: No evidence of thrombus. Normal compressibility, respiratory phasicity and response to augmentation. Calf Veins: No evidence of thrombus. Normal compressibility and flow on color Doppler imaging. Other Findings: Hypoechoic lesion measuring 5.9 x 4.1 x 5.6 cm identified in the left groin area. IMPRESSION: 1. No evidence for DVT in the left lower extremity. 2. Hypoechoic lesion left groin, potentially lymph node or cystic mass. Electronically Signed   By: Misty Stanley M.D.   On: 07/25/2016 16:48   Dg Knee Complete 4 Views Right  Result Date: 07/25/2016 CLINICAL DATA:  Pain following fall EXAM: RIGHT KNEE - COMPLETE 4+ VIEW COMPARISON:  None. FINDINGS: Frontal, lateral, and bilateral oblique views were obtained. There is no demonstrable fracture or dislocation. No appreciable joint effusion. There is moderate generalized osteoarthritic change. There is spurring in all compartments as well as mild generalized joint space narrowing. There is chondrocalcinosis. No erosive change. IMPRESSION: Moderate generalized osteoarthritic change. For calcinosis is  present which may be seen with osteoarthritis or with calcium pyrophosphate deposition disease. No demonstrable fracture or joint effusion. Electronically Signed   By: Lowella Grip III M.D.   On: 07/25/2016 16:30    ____________________________________________   PROCEDURES  Procedure(s) performed: None  Procedures  Critical Care performed: No  ____________________________________________   INITIAL IMPRESSION / ASSESSMENT AND PLAN / ED COURSE  Pertinent labs & imaging results that were available during my care of the patient were reviewed by me and considered in my medical decision making (see chart for details).  Patient presents after fall. She has a history of frequent falls, appears consistent with an there is no obvious acute new neurologic deficit. No evidence of significant trauma. Her GFR is reduced, but appears its baseline. No elevated white count. No neurologic symptoms.  Denies any chest pain or pulmonary symptoms. Vital signs stable.  Ultrasound demonstrates no DVT. The patient has known workup ongoing for lymphoma with oncology follow-up. She has home health nursing who is doing dressing changes to the left lower leg, and though her family and patient reports the leg is generally looking much improved and has started appears slightly more red over the last few days and I will place her on cephalexin, and she will follow up closely with the wound care clinic as well as her home health nursing team and primary doctor  Discussed with patient and her husband are planning of care, plan to return to home, and return precautions and, she is comfortable with the plan to go home with close follow-up.      ____________________________________________   FINAL CLINICAL IMPRESSION(S) / ED DIAGNOSES  Final diagnoses:  Leg edema, left  Falls frequently  Abrasion of right knee, initial encounter      NEW MEDICATIONS STARTED DURING THIS VISIT:  New Prescriptions    CEPHALEXIN (KEFLEX) 500 MG CAPSULE    Take 1 capsule (500 mg total) by mouth 2 (two) times daily.     Note:  This document was prepared using Dragon voice recognition software and may include unintentional dictation errors.     Delman Kitten, MD 07/25/16 [phone]

## 2016-07-25 NOTE — ED Notes (Signed)
Patient with non-bleeding abrasion to right knee.  Patient has wound that is dressed to right ankle. Patient also has chronic foley in place.

## 2016-07-25 NOTE — Telephone Encounter (Signed)
Done and faxed to [facility]

## 2016-07-25 NOTE — ED Triage Notes (Signed)
Pt with hx MS and lymphoma was going down steps and fell to knees. pt states that she feels weak. Pt said that sometimes her legs just don't want to go.

## 2016-07-26 ENCOUNTER — Telehealth: Payer: Self-pay

## 2016-07-26 DIAGNOSIS — I2729 Other secondary pulmonary hypertension: Secondary | ICD-10-CM | POA: Diagnosis not present

## 2016-07-26 DIAGNOSIS — G4733 Obstructive sleep apnea (adult) (pediatric): Secondary | ICD-10-CM | POA: Diagnosis not present

## 2016-07-26 DIAGNOSIS — G43909 Migraine, unspecified, not intractable, without status migrainosus: Secondary | ICD-10-CM | POA: Diagnosis not present

## 2016-07-26 DIAGNOSIS — I950 Idiopathic hypotension: Secondary | ICD-10-CM | POA: Diagnosis not present

## 2016-07-26 DIAGNOSIS — J449 Chronic obstructive pulmonary disease, unspecified: Secondary | ICD-10-CM | POA: Diagnosis not present

## 2016-07-26 DIAGNOSIS — I10 Essential (primary) hypertension: Secondary | ICD-10-CM | POA: Diagnosis not present

## 2016-07-26 DIAGNOSIS — G35 Multiple sclerosis: Secondary | ICD-10-CM | POA: Diagnosis not present

## 2016-07-26 DIAGNOSIS — M5416 Radiculopathy, lumbar region: Secondary | ICD-10-CM | POA: Diagnosis not present

## 2016-07-26 DIAGNOSIS — G629 Polyneuropathy, unspecified: Secondary | ICD-10-CM | POA: Diagnosis not present

## 2016-07-26 DIAGNOSIS — G501 Atypical facial pain: Secondary | ICD-10-CM | POA: Diagnosis not present

## 2016-07-26 DIAGNOSIS — M1991 Primary osteoarthritis, unspecified site: Secondary | ICD-10-CM | POA: Diagnosis not present

## 2016-07-26 DIAGNOSIS — I739 Peripheral vascular disease, unspecified: Secondary | ICD-10-CM | POA: Diagnosis not present

## 2016-07-26 NOTE — Telephone Encounter (Signed)
Please recommend PT if not already doing it Please enter order if she agrees, dx falls, gait instability

## 2016-07-26 NOTE — Telephone Encounter (Signed)
Samantha from [facility] called to inform you the pt fell 4 times at her home and three of those times EMS were called out to her home. [facility] due to not able to afford the copay so right now [facility] are trying to help her be a little more safe at home.

## 2016-07-27 DIAGNOSIS — I739 Peripheral vascular disease, unspecified: Secondary | ICD-10-CM | POA: Diagnosis not present

## 2016-07-27 DIAGNOSIS — M5416 Radiculopathy, lumbar region: Secondary | ICD-10-CM | POA: Diagnosis not present

## 2016-07-27 DIAGNOSIS — M1991 Primary osteoarthritis, unspecified site: Secondary | ICD-10-CM | POA: Diagnosis not present

## 2016-07-27 DIAGNOSIS — I2729 Other secondary pulmonary hypertension: Secondary | ICD-10-CM | POA: Diagnosis not present

## 2016-07-27 DIAGNOSIS — I950 Idiopathic hypotension: Secondary | ICD-10-CM | POA: Diagnosis not present

## 2016-07-27 DIAGNOSIS — G35 Multiple sclerosis: Secondary | ICD-10-CM | POA: Diagnosis not present

## 2016-07-27 DIAGNOSIS — G4733 Obstructive sleep apnea (adult) (pediatric): Secondary | ICD-10-CM | POA: Diagnosis not present

## 2016-07-27 DIAGNOSIS — G501 Atypical facial pain: Secondary | ICD-10-CM | POA: Diagnosis not present

## 2016-07-27 DIAGNOSIS — G43909 Migraine, unspecified, not intractable, without status migrainosus: Secondary | ICD-10-CM | POA: Diagnosis not present

## 2016-07-27 DIAGNOSIS — I10 Essential (primary) hypertension: Secondary | ICD-10-CM | POA: Diagnosis not present

## 2016-07-27 DIAGNOSIS — J449 Chronic obstructive pulmonary disease, unspecified: Secondary | ICD-10-CM | POA: Diagnosis not present

## 2016-07-27 DIAGNOSIS — G629 Polyneuropathy, unspecified: Secondary | ICD-10-CM | POA: Diagnosis not present

## 2016-07-29 DIAGNOSIS — M1991 Primary osteoarthritis, unspecified site: Secondary | ICD-10-CM | POA: Diagnosis not present

## 2016-07-29 DIAGNOSIS — I10 Essential (primary) hypertension: Secondary | ICD-10-CM | POA: Diagnosis not present

## 2016-07-29 DIAGNOSIS — J449 Chronic obstructive pulmonary disease, unspecified: Secondary | ICD-10-CM | POA: Diagnosis not present

## 2016-07-29 DIAGNOSIS — I950 Idiopathic hypotension: Secondary | ICD-10-CM | POA: Diagnosis not present

## 2016-07-29 DIAGNOSIS — G4733 Obstructive sleep apnea (adult) (pediatric): Secondary | ICD-10-CM | POA: Diagnosis not present

## 2016-07-29 DIAGNOSIS — G43909 Migraine, unspecified, not intractable, without status migrainosus: Secondary | ICD-10-CM | POA: Diagnosis not present

## 2016-07-29 DIAGNOSIS — G501 Atypical facial pain: Secondary | ICD-10-CM | POA: Diagnosis not present

## 2016-07-29 DIAGNOSIS — M5416 Radiculopathy, lumbar region: Secondary | ICD-10-CM | POA: Diagnosis not present

## 2016-07-29 DIAGNOSIS — I2729 Other secondary pulmonary hypertension: Secondary | ICD-10-CM | POA: Diagnosis not present

## 2016-07-29 DIAGNOSIS — G35 Multiple sclerosis: Secondary | ICD-10-CM | POA: Diagnosis not present

## 2016-07-29 DIAGNOSIS — G629 Polyneuropathy, unspecified: Secondary | ICD-10-CM | POA: Diagnosis not present

## 2016-07-29 DIAGNOSIS — I739 Peripheral vascular disease, unspecified: Secondary | ICD-10-CM | POA: Diagnosis not present

## 2016-07-30 DIAGNOSIS — M5416 Radiculopathy, lumbar region: Secondary | ICD-10-CM | POA: Diagnosis not present

## 2016-07-30 DIAGNOSIS — I739 Peripheral vascular disease, unspecified: Secondary | ICD-10-CM | POA: Diagnosis not present

## 2016-07-30 DIAGNOSIS — M1991 Primary osteoarthritis, unspecified site: Secondary | ICD-10-CM | POA: Diagnosis not present

## 2016-07-30 DIAGNOSIS — G43909 Migraine, unspecified, not intractable, without status migrainosus: Secondary | ICD-10-CM | POA: Diagnosis not present

## 2016-07-30 DIAGNOSIS — G4733 Obstructive sleep apnea (adult) (pediatric): Secondary | ICD-10-CM | POA: Diagnosis not present

## 2016-07-30 DIAGNOSIS — G501 Atypical facial pain: Secondary | ICD-10-CM | POA: Diagnosis not present

## 2016-07-30 DIAGNOSIS — G629 Polyneuropathy, unspecified: Secondary | ICD-10-CM | POA: Diagnosis not present

## 2016-07-30 DIAGNOSIS — J449 Chronic obstructive pulmonary disease, unspecified: Secondary | ICD-10-CM | POA: Diagnosis not present

## 2016-07-30 DIAGNOSIS — I950 Idiopathic hypotension: Secondary | ICD-10-CM | POA: Diagnosis not present

## 2016-07-30 DIAGNOSIS — I10 Essential (primary) hypertension: Secondary | ICD-10-CM | POA: Diagnosis not present

## 2016-07-30 DIAGNOSIS — I2729 Other secondary pulmonary hypertension: Secondary | ICD-10-CM | POA: Diagnosis not present

## 2016-07-30 DIAGNOSIS — G35 Multiple sclerosis: Secondary | ICD-10-CM | POA: Diagnosis not present

## 2016-07-31 ENCOUNTER — Telehealth: Payer: Self-pay | Admitting: Family Medicine

## 2016-07-31 DIAGNOSIS — M5416 Radiculopathy, lumbar region: Secondary | ICD-10-CM | POA: Diagnosis not present

## 2016-07-31 DIAGNOSIS — I10 Essential (primary) hypertension: Secondary | ICD-10-CM | POA: Diagnosis not present

## 2016-07-31 DIAGNOSIS — I2729 Other secondary pulmonary hypertension: Secondary | ICD-10-CM | POA: Diagnosis not present

## 2016-07-31 DIAGNOSIS — J449 Chronic obstructive pulmonary disease, unspecified: Secondary | ICD-10-CM | POA: Diagnosis not present

## 2016-07-31 DIAGNOSIS — G43909 Migraine, unspecified, not intractable, without status migrainosus: Secondary | ICD-10-CM | POA: Diagnosis not present

## 2016-07-31 DIAGNOSIS — G629 Polyneuropathy, unspecified: Secondary | ICD-10-CM | POA: Diagnosis not present

## 2016-07-31 DIAGNOSIS — G501 Atypical facial pain: Secondary | ICD-10-CM | POA: Diagnosis not present

## 2016-07-31 DIAGNOSIS — I950 Idiopathic hypotension: Secondary | ICD-10-CM | POA: Diagnosis not present

## 2016-07-31 DIAGNOSIS — G35 Multiple sclerosis: Secondary | ICD-10-CM | POA: Diagnosis not present

## 2016-07-31 DIAGNOSIS — G4733 Obstructive sleep apnea (adult) (pediatric): Secondary | ICD-10-CM | POA: Diagnosis not present

## 2016-07-31 DIAGNOSIS — I739 Peripheral vascular disease, unspecified: Secondary | ICD-10-CM | POA: Diagnosis not present

## 2016-07-31 DIAGNOSIS — M1991 Primary osteoarthritis, unspecified site: Secondary | ICD-10-CM | POA: Diagnosis not present

## 2016-07-31 LAB — CHROMOSOME ANALYSIS, BONE MARROW

## 2016-07-31 NOTE — Telephone Encounter (Signed)
Left a detail message asking Aldona Bar to give me a call back regarding the pt falls.

## 2016-07-31 NOTE — Telephone Encounter (Signed)
Aldona Bar from [facility] states pt had fall today. This is the 7th or 8th fall where EMS had to come to help her off the floor. Pt has appt this Friday but Aldona Bar is not certain if patient will make it. States that patient fell today trying to open the door to let nurse in therefore they feel it is not safe. Home health is going to speak for friend/family member to see about other options about transportation. Pt was at facility prior to coming home but was not able to afford the co pay any longer for the facility stay. Aldona Bar has tried the wheelchair option however due to patient having a step to get out the door to the ramp it will not work. also the wheelchair will not fit through her bedroom or bathroom door. [phone]

## 2016-07-31 NOTE — Telephone Encounter (Signed)
After hours service called Barnetta Chapel, [phone] let Team health nurse know patient fell; please call her ---------------------------------- I spoke with Barnetta Chapel; they have been trying to get her a lift chair, things to try to help her; her home is not big enough for wheelchair; they are "wondering about admission to assisted living or what" She works with [employer]; they are having to do incident reports She is not in immediate danger she says They put in a referral to social work on 07/24/16 and that is with their company I asked her to please call the social worker about disposition They came out on 07/26/16 to see patient I asked her to please contact social worker about current situation and see about placement or other help ------------------------------------ Staff, please contact Aldona Bar (first part of note); let her know that I already spoke with Barnetta Chapel and [place] like them to contact the Education officer, museum; social work did a referral and saw patient on March 30th so we'd like them to help patient with current needs, see if other living situation needed or other assistance at this time Thank you

## 2016-07-31 NOTE — Telephone Encounter (Signed)
I tried to contact [name] to see how we can help this patient but there was no answer. A message was left for her to give [place] a call back at her earliest convenience.  I then spoke with Aldona Bar and she said that the social worker will be out tomorrow. She stated that she feels the patient needs to go back to skill nursing.  I asked if there was anything that we can do to help out with this issue, but she said she really does not know at this moment but will talk with the social worker tomorrow.  I asked her to please have the social worker to give [place] a call after her visits so that we can do whatever it is to help this patient.  She agreed and said thanks

## 2016-08-01 ENCOUNTER — Telehealth: Payer: Self-pay

## 2016-08-01 ENCOUNTER — Telehealth: Payer: Self-pay | Admitting: Family Medicine

## 2016-08-01 DIAGNOSIS — M5416 Radiculopathy, lumbar region: Secondary | ICD-10-CM | POA: Diagnosis not present

## 2016-08-01 DIAGNOSIS — I950 Idiopathic hypotension: Secondary | ICD-10-CM | POA: Diagnosis not present

## 2016-08-01 DIAGNOSIS — M1991 Primary osteoarthritis, unspecified site: Secondary | ICD-10-CM | POA: Diagnosis not present

## 2016-08-01 DIAGNOSIS — G43909 Migraine, unspecified, not intractable, without status migrainosus: Secondary | ICD-10-CM | POA: Diagnosis not present

## 2016-08-01 DIAGNOSIS — G501 Atypical facial pain: Secondary | ICD-10-CM | POA: Diagnosis not present

## 2016-08-01 DIAGNOSIS — G4733 Obstructive sleep apnea (adult) (pediatric): Secondary | ICD-10-CM | POA: Diagnosis not present

## 2016-08-01 DIAGNOSIS — I10 Essential (primary) hypertension: Secondary | ICD-10-CM | POA: Diagnosis not present

## 2016-08-01 DIAGNOSIS — G35 Multiple sclerosis: Secondary | ICD-10-CM | POA: Diagnosis not present

## 2016-08-01 DIAGNOSIS — J449 Chronic obstructive pulmonary disease, unspecified: Secondary | ICD-10-CM | POA: Diagnosis not present

## 2016-08-01 DIAGNOSIS — I2729 Other secondary pulmonary hypertension: Secondary | ICD-10-CM | POA: Diagnosis not present

## 2016-08-01 DIAGNOSIS — G629 Polyneuropathy, unspecified: Secondary | ICD-10-CM | POA: Diagnosis not present

## 2016-08-01 DIAGNOSIS — I739 Peripheral vascular disease, unspecified: Secondary | ICD-10-CM | POA: Diagnosis not present

## 2016-08-01 NOTE — Telephone Encounter (Signed)
I agree that placement is the best option for her I spoke at length with staff yesterday about getting social worker to assist with this I'll be glad to sign any FL-2 or paperwork to get her placed

## 2016-08-01 NOTE — Telephone Encounter (Signed)
Nurse from [facility] called states she fell again this morning while walking to kitchen, EMS came to get her up.  Declined going to ER denies any pain.

## 2016-08-01 NOTE — Telephone Encounter (Signed)
Verbal given 

## 2016-08-01 NOTE — Telephone Encounter (Signed)
[facility] requesting a verbal order for Occupational therapy for 1x a week for 1 week and then 2x week for 4 weeks. Reece Packer [phone]

## 2016-08-01 NOTE — Telephone Encounter (Signed)
Barnetta Chapel, RN with [facility] called stating that the patient fell again today and EMS had to come out to help her get up since she was to weak to get up on her own. They put sneakers on her yesterday and tried to move some things out of the way to prevent any injuries if she was to fall again.  They are still waiting to for the social worker to come in but strongly agrees that this patient needs to be placed in another home setting.  She said that she will give [place] a call if anything changes.

## 2016-08-02 ENCOUNTER — Encounter: Payer: Self-pay | Admitting: Family Medicine

## 2016-08-02 ENCOUNTER — Ambulatory Visit (INDEPENDENT_AMBULATORY_CARE_PROVIDER_SITE_OTHER): Payer: Medicare Other | Admitting: Family Medicine

## 2016-08-02 ENCOUNTER — Telehealth: Payer: Self-pay

## 2016-08-02 DIAGNOSIS — N319 Neuromuscular dysfunction of bladder, unspecified: Secondary | ICD-10-CM | POA: Diagnosis not present

## 2016-08-02 DIAGNOSIS — R296 Repeated falls: Secondary | ICD-10-CM | POA: Diagnosis not present

## 2016-08-02 DIAGNOSIS — G35 Multiple sclerosis: Secondary | ICD-10-CM

## 2016-08-02 DIAGNOSIS — C8293 Follicular lymphoma, unspecified, intra-abdominal lymph nodes: Secondary | ICD-10-CM | POA: Diagnosis not present

## 2016-08-02 DIAGNOSIS — F3341 Major depressive disorder, recurrent, in partial remission: Secondary | ICD-10-CM | POA: Diagnosis not present

## 2016-08-02 NOTE — Telephone Encounter (Signed)
Called nurse Aldona Bar at [facility] to get information about patients Education officer, museum.  Called social worker Dimitri Ped [phone] about getting placed into skilled nursing Dr. Sanda Klein smoke with him.

## 2016-08-02 NOTE — Assessment & Plan Note (Addendum)
Discussed placement; talked about safety; social worker is helping; I personally spoke with the gentleman who was at her home earlier today; voiced my concern about her falls; risk of hip fracture, head injury, etc with her condition; urged placement; if unable to place right away, then at least send someone to the home to help with her transfers etc until she can get placement; explained to him that she slept overnight in a chair because she could not get back to bed

## 2016-08-02 NOTE — Progress Notes (Signed)
BP 136/64   Pulse 69   Temp 99 F (37.2 C) (Oral)   Resp 16   SpO2 94%    Subjective:    Patient ID: [name], female    DOB: [birth year], 66 y.o.   MRN: [id number]  HPI: [name] is a 66 y.o. female  Chief Complaint  Patient presents with  . Follow-up  . Fall   Patient is here for f/u [facility] yesterday; she had to get out of bed; on the way to the dining room; legs just wouldn't go; no LOC, no presyncope, no funny heart rhythm Was at [facility] Lymphoma, recent surgery, also with multiple sclerosis Husband does not help with IADLs very much she says; he does help her get in and out of bed; he bought a big high truck Indwelling catheter; urine bag has sprung a leak There was a man there this morning talking about assisted living with her; he came out to her house; from [facility] Please see notes from home health/nurse Reviewed these today  Depression screen [facility] 2/9 08/02/2016 06/18/2016 06/07/2016 12/22/2015 11/08/2015  Decreased Interest 0 0 0 3 1  Down, Depressed, Hopeless 1 0 1 3 3   PHQ - 2 Score 1 0 1 6 4   Altered sleeping - - - 0 1  Tired, decreased energy - - - 3 3  Change in appetite - - - 0 3  Feeling bad or failure about yourself  - - - 3 2  Trouble concentrating - - - 3 3  Moving slowly or fidgety/restless - - - 0 0  Suicidal thoughts - - - 0 0  PHQ-9 Score - - - 15 16  Difficult doing work/chores - - - Very difficult Somewhat difficult   Relevant past medical, surgical, family and social history reviewed Past Medical History:  Diagnosis Date  . ADHD   . Anxiety   . COPD (chronic obstructive pulmonary disease) ([place])   . Depression    major depressive  . Dyspnea    doe  . Edema    left leg  . Follicular lymphoma ([place])    B Cell  . Hypertension   . Hypotension    idiopathic  . Kyphoscoliosis and scoliosis 11/26/2011  . Morbid obesity ([place]) 01/05/2016  . Multiple sclerosis ([employer])   . Multiple sclerosis ([place])    1980's  . Neuromuscular disorder ([place])     . Obstructive and reflux uropathy    foley  . Pain    atypical facial  . Peripheral vascular disease of lower extremity with ulceration ([place]) 11/08/2015  . Skin ulcer ([place]) 11/08/2015  . Weakness    generalized. has MS   Past Surgical History:  Procedure Laterality Date  . BACK SURGERY N/A 2002  . CYST EXCISION     lower back  . INGUINAL LYMPH NODE BIOPSY Left 07/04/2016   Procedure: INGUINAL LYMPH NODE BIOPSY;  Surgeon: Christene Lye, MD;  Location: [facility] ORS;  Service: General;  Laterality: Left;  . PORTACATH PLACEMENT N/A 07/22/2016   Procedure: INSERTION PORT-A-CATH;  Surgeon: Christene Lye, MD;  Location: [facility] ORS;  Service: General;  Laterality: N/A;  . TONSILLECTOMY AND ADENOIDECTOMY    . TUBAL LIGATION     Family History  Problem Relation Age of Onset  . COPD Mother   . Diabetes Mother   . Heart failure Mother   . Alcohol abuse Father   . Kidney disease Father   . Kidney failure Father   .  Arthritis Sister   . CAD Maternal Grandmother   . Stroke Maternal Grandfather   . Diabetes Sister   . Arthritis Sister   . Arthritis Brother    Social History  Substance Use Topics  . Smoking status: Former Smoker    Packs/day: 1.00    Years: 20.00    Types: Cigarettes    Quit date: 02/03/2016  . Smokeless tobacco: Never Used  . Alcohol use No   Interim medical history since last visit reviewed. Allergies and medications reviewed  Review of Systems Per HPI unless specifically indicated above     Objective:    BP 136/64   Pulse 69   Temp 99 F (37.2 C) (Oral)   Resp 16   SpO2 94%   Wt Readings from Last 3 Encounters:  07/25/16 284 lb (128.8 kg)  07/18/16 294 lb (133.4 kg)  07/11/16 (!) 312 lb (141.5 kg)    Physical Exam  Constitutional: She appears well-developed and well-nourished. No distress.  Weight gain noted; morbidly obese  HENT:  Mouth/Throat: Mucous membranes are normal.  Eyes: No scleral icterus.  Cardiovascular: Normal rate and  regular rhythm.   Pulmonary/Chest: Effort normal and breath sounds normal.  Abdominal: She exhibits no distension.  Musculoskeletal:  Left leg wrapped  Skin:  Surgical site left groin C/D/I  Psychiatric: Her mood appears not anxious. Her affect is blunt. She exhibits a depressed mood.  Affect a little flat      Assessment & Plan:   Problem List Items Addressed This Visit      Nervous and Auditory   Multiple sclerosis ([place])    Impacting her ability to do ADLs, IADLs; discussed limitations at home, need for assistance; believe she needs nursing home or at the very least assisted living placement; I spoke with gentleman Education officer, museum who was at her home this morning; he will try to help with placement asap        Immune and Lymphatic   Follicular lymphoma of intra-abdominal lymph nodes ([place])    Managed by oncologist; biopsy site appears to be healing well        Other   Multiple falls    Discussed placement; talked about safety; social worker is helping; I personally spoke with the gentleman who was at her home earlier today; voiced my concern about her falls; risk of hip fracture, head injury, etc with her condition; urged placement; if unable to place right away, then at least send someone to the home to help with her transfers etc until she can get placement; explained to him that she slept overnight in a chair because she could not get back to bed      Major depressive disorder, recurrent, in partial remission ([place])    Supportive listening provided; encourged her to talk with her psychiatrist about all that is going on      Bladder neurogenesis    With indwelling foley catheter; nurse practitioner helped her with her collection bag situation today         Face-to-face time with patient was more than 25 minutes, >50% time spent counseling and coordination of care  Follow up plan: Return in about 3 weeks (around 08/23/2016) for follow-up.  An after-visit summary was  printed and given to the patient at [facility].  Please see the patient instructions which may contain other information and recommendations beyond what is mentioned above in the assessment and plan.

## 2016-08-02 NOTE — Patient Instructions (Addendum)
Please know that we are right here with you Call if I can be of service Please do practice good fall precautions Contact your psychiatrist if you think adjusting your medicine might help  Fall Prevention in the Home Falls can cause injuries. They can happen to people of all ages. There are many things you can do to make your home safe and to help prevent falls. What can I do on the outside of my home?  Regularly fix the edges of walkways and driveways and fix any cracks.  Remove anything that might make you trip as you walk through a door, such as a raised step or threshold.  Trim any bushes or trees on the path to your home.  Use bright outdoor lighting.  Clear any walking paths of anything that might make someone trip, such as rocks or tools.  Regularly check to see if handrails are loose or broken. Make sure that both sides of any steps have handrails.  Any raised decks and porches should have guardrails on the edges.  Have any leaves, snow, or ice cleared regularly.  Use sand or salt on walking paths during winter.  Clean up any spills in your garage right away. This includes oil or grease spills. What can I do in the bathroom?  Use night lights.  Install grab bars by the toilet and in the tub and shower. Do not use towel bars as grab bars.  Use non-skid mats or decals in the tub or shower.  If you need to sit down in the shower, use a plastic, non-slip stool.  Keep the floor dry. Clean up any water that spills on the floor as soon as it happens.  Remove soap buildup in the tub or shower regularly.  Attach bath mats securely with double-sided non-slip rug tape.  Do not have throw rugs and other things on the floor that can make you trip. What can I do in the bedroom?  Use night lights.  Make sure that you have a light by your bed that is easy to reach.  Do not use any sheets or blankets that are too big for your bed. They should not hang down onto the  floor.  Have a firm chair that has side arms. You can use this for support while you get dressed.  Do not have throw rugs and other things on the floor that can make you trip. What can I do in the kitchen?  Clean up any spills right away.  Avoid walking on wet floors.  Keep items that you use a lot in easy-to-reach places.  If you need to reach something above you, use a strong step stool that has a grab bar.  Keep electrical cords out of the way.  Do not use floor polish or wax that makes floors slippery. If you must use wax, use non-skid floor wax.  Do not have throw rugs and other things on the floor that can make you trip. What can I do with my stairs?  Do not leave any items on the stairs.  Make sure that there are handrails on both sides of the stairs and use them. Fix handrails that are broken or loose. Make sure that handrails are as long as the stairways.  Check any carpeting to make sure that it is firmly attached to the stairs. Fix any carpet that is loose or worn.  Avoid having throw rugs at the top or bottom of the stairs. If you do have  throw rugs, attach them to the floor with carpet tape.  Make sure that you have a light switch at the top of the stairs and the bottom of the stairs. If you do not have them, ask someone to add them for you. What else can I do to help prevent falls?  Wear shoes that:  Do not have high heels.  Have rubber bottoms.  Are comfortable and fit you well.  Are closed at the toe. Do not wear sandals.  If you use a stepladder:  Make sure that it is fully opened. Do not climb a closed stepladder.  Make sure that both sides of the stepladder are locked into place.  Ask someone to hold it for you, if possible.  Clearly mark and make sure that you can see:  Any grab bars or handrails.  First and last steps.  Where the edge of each step is.  Use tools that help you move around (mobility aids) if they are needed. These  include:  Canes.  Walkers.  Scooters.  Crutches.  Turn on the lights when you go into a dark area. Replace any light bulbs as soon as they burn out.  Set up your furniture so you have a clear path. Avoid moving your furniture around.  If any of your floors are uneven, fix them.  If there are any pets around you, be aware of where they are.  Review your medicines with your doctor. Some medicines can make you feel dizzy. This can increase your chance of falling. Ask your doctor what other things that you can do to help prevent falls. This information is not intended to replace advice given to you by your health care provider. Make sure you discuss any questions you have with your health care provider. Document Released: 02/09/2009 Document Revised: 09/21/2015 Document Reviewed: 05/20/2014 Elsevier Interactive Patient Education  2017 [employer].

## 2016-08-05 DIAGNOSIS — M1991 Primary osteoarthritis, unspecified site: Secondary | ICD-10-CM | POA: Diagnosis not present

## 2016-08-05 DIAGNOSIS — I950 Idiopathic hypotension: Secondary | ICD-10-CM | POA: Diagnosis not present

## 2016-08-05 DIAGNOSIS — G4733 Obstructive sleep apnea (adult) (pediatric): Secondary | ICD-10-CM | POA: Diagnosis not present

## 2016-08-05 DIAGNOSIS — I10 Essential (primary) hypertension: Secondary | ICD-10-CM | POA: Diagnosis not present

## 2016-08-05 DIAGNOSIS — M5416 Radiculopathy, lumbar region: Secondary | ICD-10-CM | POA: Diagnosis not present

## 2016-08-05 DIAGNOSIS — G43909 Migraine, unspecified, not intractable, without status migrainosus: Secondary | ICD-10-CM | POA: Diagnosis not present

## 2016-08-05 DIAGNOSIS — G629 Polyneuropathy, unspecified: Secondary | ICD-10-CM | POA: Diagnosis not present

## 2016-08-05 DIAGNOSIS — J449 Chronic obstructive pulmonary disease, unspecified: Secondary | ICD-10-CM | POA: Diagnosis not present

## 2016-08-05 DIAGNOSIS — I739 Peripheral vascular disease, unspecified: Secondary | ICD-10-CM | POA: Diagnosis not present

## 2016-08-05 DIAGNOSIS — G35 Multiple sclerosis: Secondary | ICD-10-CM | POA: Diagnosis not present

## 2016-08-05 DIAGNOSIS — G501 Atypical facial pain: Secondary | ICD-10-CM | POA: Diagnosis not present

## 2016-08-05 DIAGNOSIS — I2729 Other secondary pulmonary hypertension: Secondary | ICD-10-CM | POA: Diagnosis not present

## 2016-08-06 ENCOUNTER — Inpatient Hospital Stay: Payer: Medicare Other | Attending: Oncology | Admitting: Oncology

## 2016-08-06 ENCOUNTER — Encounter: Payer: Self-pay | Admitting: Oncology

## 2016-08-06 ENCOUNTER — Ambulatory Visit: Payer: Medicare Other | Admitting: General Surgery

## 2016-08-06 ENCOUNTER — Encounter (HOSPITAL_COMMUNITY): Payer: Self-pay

## 2016-08-06 VITALS — BP 103/69 | HR 123 | Temp 97.9°F | Resp 20

## 2016-08-06 DIAGNOSIS — G501 Atypical facial pain: Secondary | ICD-10-CM | POA: Diagnosis not present

## 2016-08-06 DIAGNOSIS — I708 Atherosclerosis of other arteries: Secondary | ICD-10-CM | POA: Insufficient documentation

## 2016-08-06 DIAGNOSIS — I739 Peripheral vascular disease, unspecified: Secondary | ICD-10-CM | POA: Insufficient documentation

## 2016-08-06 DIAGNOSIS — R0602 Shortness of breath: Secondary | ICD-10-CM | POA: Insufficient documentation

## 2016-08-06 DIAGNOSIS — R590 Localized enlarged lymph nodes: Secondary | ICD-10-CM | POA: Diagnosis not present

## 2016-08-06 DIAGNOSIS — M419 Scoliosis, unspecified: Secondary | ICD-10-CM | POA: Diagnosis not present

## 2016-08-06 DIAGNOSIS — I10 Essential (primary) hypertension: Secondary | ICD-10-CM | POA: Insufficient documentation

## 2016-08-06 DIAGNOSIS — M5416 Radiculopathy, lumbar region: Secondary | ICD-10-CM | POA: Diagnosis not present

## 2016-08-06 DIAGNOSIS — K5900 Constipation, unspecified: Secondary | ICD-10-CM | POA: Insufficient documentation

## 2016-08-06 DIAGNOSIS — F419 Anxiety disorder, unspecified: Secondary | ICD-10-CM

## 2016-08-06 DIAGNOSIS — R531 Weakness: Secondary | ICD-10-CM

## 2016-08-06 DIAGNOSIS — R609 Edema, unspecified: Secondary | ICD-10-CM | POA: Insufficient documentation

## 2016-08-06 DIAGNOSIS — G4733 Obstructive sleep apnea (adult) (pediatric): Secondary | ICD-10-CM | POA: Diagnosis not present

## 2016-08-06 DIAGNOSIS — F329 Major depressive disorder, single episode, unspecified: Secondary | ICD-10-CM | POA: Insufficient documentation

## 2016-08-06 DIAGNOSIS — G43909 Migraine, unspecified, not intractable, without status migrainosus: Secondary | ICD-10-CM | POA: Diagnosis not present

## 2016-08-06 DIAGNOSIS — M1991 Primary osteoarthritis, unspecified site: Secondary | ICD-10-CM | POA: Diagnosis not present

## 2016-08-06 DIAGNOSIS — Z5112 Encounter for antineoplastic immunotherapy: Secondary | ICD-10-CM | POA: Diagnosis not present

## 2016-08-06 DIAGNOSIS — R5383 Other fatigue: Secondary | ICD-10-CM | POA: Diagnosis not present

## 2016-08-06 DIAGNOSIS — I2729 Other secondary pulmonary hypertension: Secondary | ICD-10-CM | POA: Diagnosis not present

## 2016-08-06 DIAGNOSIS — Z87891 Personal history of nicotine dependence: Secondary | ICD-10-CM | POA: Diagnosis not present

## 2016-08-06 DIAGNOSIS — G35 Multiple sclerosis: Secondary | ICD-10-CM | POA: Diagnosis not present

## 2016-08-06 DIAGNOSIS — C8293 Follicular lymphoma, unspecified, intra-abdominal lymph nodes: Secondary | ICD-10-CM | POA: Insufficient documentation

## 2016-08-06 DIAGNOSIS — I950 Idiopathic hypotension: Secondary | ICD-10-CM | POA: Diagnosis not present

## 2016-08-06 DIAGNOSIS — Z79899 Other long term (current) drug therapy: Secondary | ICD-10-CM | POA: Insufficient documentation

## 2016-08-06 DIAGNOSIS — G629 Polyneuropathy, unspecified: Secondary | ICD-10-CM | POA: Diagnosis not present

## 2016-08-06 DIAGNOSIS — F909 Attention-deficit hyperactivity disorder, unspecified type: Secondary | ICD-10-CM | POA: Diagnosis not present

## 2016-08-06 DIAGNOSIS — E669 Obesity, unspecified: Secondary | ICD-10-CM | POA: Insufficient documentation

## 2016-08-06 DIAGNOSIS — J449 Chronic obstructive pulmonary disease, unspecified: Secondary | ICD-10-CM | POA: Diagnosis not present

## 2016-08-06 DIAGNOSIS — Z5181 Encounter for therapeutic drug level monitoring: Secondary | ICD-10-CM

## 2016-08-06 NOTE — Progress Notes (Signed)
Patient is here today for follow up she is ding well she does mention minor shortness of breath but can breathe fine. She would like to know when she can start treatment

## 2016-08-06 NOTE — Progress Notes (Addendum)
ALERT: Recent [facility] Treatment decision is outdated. Please await next [facility] decision 

## 2016-08-06 NOTE — Progress Notes (Signed)
START OFF [facility] REGIMEN - Lymphoma and CLL   [id number]:Rituximab (Weekly):   Administer weekly:     Rituximab   **Always confirm dose/schedule in your pharmacy ordering system**    Patient Characteristics: Follicular, Grades 1, 2, and 3A, First Line, Stage I / II Disease Type: Follicular Disease Type: Not Applicable [place] Stage: II Tumor Grade: 2 Line of therapy: First Line  Intent of Therapy: Non-Curative / Palliative Intent, Discussed with Patient

## 2016-08-06 NOTE — Progress Notes (Signed)
Hematology/Oncology Consult note [facility]  Telephone:([phone] [phone]  Patient Care Team: Arnetha Courser, MD as PCP - General (Family Medicine) Arnetha Courser, MD as Attending Physician (Family Medicine) Christene Lye, MD (General Surgery)   Name of the patient: [name]  [id number]  [birth year]   Date of visit: 08/06/16  Diagnosis- Stage II follicular lymphoma  Chief complaint/ Reason for visit- discuss results of biopsy  Heme/Onc history: 1. patient is a 66 year old female who has had ongoing problems with pain in her left groin as well as his left leg swelling.   2. She had a Doppler of her left lower extremity on 02/28/2016 which showed: 1. Mild to moderate edema within the left calf. No evidence for acute DVT from left groin to popliteal fossa. Slightly limited evaluation of calf veins due to edema. 2. Left inguinal enlarged lymph nodes up to 6.2 cm, findings could be secondary to reactive node, infection, inflammation, or lymphoproliferative disease.  3. She had a repeat Doppler on 05/24/2016 which showed: IMPRESSION: 1. No evidence of lower extremity deep vein thrombosis, left. 2. Left inguinal lymphadenopathy. Differential diagnosis: Reactive adenopathy, lymphoma, metastatic disease.  4. CT abdomen on 06/14/2016 showed:   Vascular/Lymphatic: Aortic calcifications are noted without aneurysmal dilatation. Considerable lymphadenopathy is noted in the left inguinal region. The largest of these measures 6.1 by 3.0 cm in greatest dimension. This is similar to that seen on the prior ultrasound examination and remains suspicious. Additionally a lymphomatous mass is noted within the left hemipelvis which measures approximately 10 by 4.5 cm. Scattered left iliac and periaortic lymph nodes are seen. The periaortic lymph nodes are not significantly enlarged. The other left iliac chain nodes measure upwards of 18  mm.   IMPRESSION: Lymphadenopathy within the left inguinal region and the left iliac chain. This is highly suspicious for underlying neoplasm. Biopsy is recommended for further evaluation.  5. Patient was seen by Dr. Jamal Collin from surgery and underwent excisional biopsy of the left inguinal lymph node under ultrasound on 07/04/2016. Final pathology not reported yet. Dr. Luana Shu told me over the phone that this appears to be CD 20+ B cell lymphoma  6. Patient has multiple sclerosis and has h/o depression and anxiety. She was given clonopin this morning and currently patient is drowsy but able to be aroused. She drifts off to sleep in between conversation. She is here with her friend who helps her out. Patient lives with her husband at baseline and uses a walker to ambulate. Reports pain and discomfort in her left groin. Denies fevers, chills, night sweats or weight loss  7. PET/CT scan from 07/16/16 showed: IMPRESSION: 1. Pathologic and hypermetabolic lower retroperitoneal, pelvic, and inguinal adenopathy compatible with Deauville 5 malignancy. 2. Mildly prominent right lower paratracheal lymph node, Deauville 4. 3. Accentuated activity in the right lateral pterygoid muscle without CT correlate, probably incidental, merits attention on any follow up. 4. Other imaging findings of potential clinical significance: Airway thickening is present, suggesting bronchitis or reactive airways disease. Trace left pleural effusion. Coronary and aortoiliac atherosclerosis. Prominent stool throughout the colon favors constipation. Late phase healing fractures of left lower anterior ribs.  8. Bone marrow biopsy from 07/18/2016 did not show any evidence of lymphoma. HIV and hepatitis B and hepatitis C testing was negative  Interval history- patient had port placed by Dr. Jamal Collin. She was supposed to see me last week but had to go to ER following a fall. She lives with  ehr husband and spends her time mostly  in bed. She ambulates and is able to perform her ADL's with difficulty  ECOG PS- 3 Pain scale- 3 Opioid associated constipation- no  Review of systems- Review of Systems  Constitutional: Positive for malaise/fatigue. Negative for chills, fever and weight loss.  HENT: Negative for congestion, ear discharge and nosebleeds.   Eyes: Negative for blurred vision.  Respiratory: Negative for cough, hemoptysis, sputum production, shortness of breath and wheezing.   Cardiovascular: Positive for leg swelling. Negative for chest pain, palpitations, orthopnea and claudication.  Gastrointestinal: Negative for abdominal pain, blood in stool, constipation, diarrhea, heartburn, melena, nausea and vomiting.       Left groin discomfort  Genitourinary: Negative for dysuria, flank pain, frequency, hematuria and urgency.  Musculoskeletal: Negative for back pain, joint pain and myalgias.  Skin: Negative for rash.  Neurological: Negative for dizziness, tingling, focal weakness, seizures, weakness and headaches.  Endo/Heme/Allergies: Does not bruise/bleed easily.  Psychiatric/Behavioral: Negative for depression and suicidal ideas. The patient does not have insomnia.      Current treatment- yet to start  No Known Allergies   Past Medical History:  Diagnosis Date  . ADHD   . Anxiety   . COPD (chronic obstructive pulmonary disease) ([place])   . Depression    major depressive  . Dyspnea    doe  . Edema    left leg  . Follicular lymphoma ([place])    B Cell  . Hypertension   . Hypotension    idiopathic  . Kyphoscoliosis and scoliosis 11/26/2011  . Morbid obesity ([place]) 01/05/2016  . Multiple sclerosis ([place])   . Multiple sclerosis ([place])    1980's  . Neuromuscular disorder ([place])   . Obstructive and reflux uropathy    foley  . Pain    atypical facial  . Peripheral vascular disease of lower extremity with ulceration ([place]) 11/08/2015  . Skin ulcer ([place]) 11/08/2015  . Weakness    generalized. has MS      Past Surgical History:  Procedure Laterality Date  . BACK SURGERY N/A 2002  . CYST EXCISION     lower back  . INGUINAL LYMPH NODE BIOPSY Left 07/04/2016   Procedure: INGUINAL LYMPH NODE BIOPSY;  Surgeon: Christene Lye, MD;  Location: [facility] ORS;  Service: General;  Laterality: Left;  . PORTACATH PLACEMENT N/A 07/22/2016   Procedure: INSERTION PORT-A-CATH;  Surgeon: Christene Lye, MD;  Location: [facility] ORS;  Service: General;  Laterality: N/A;  . TONSILLECTOMY AND ADENOIDECTOMY    . TUBAL LIGATION      Social History   Social History  . Marital status: Married    Spouse name: N/A  . Number of children: 3  . Years of education: N/A   Occupational History  . disabled    Social History Main Topics  . Smoking status: Former Smoker    Packs/day: 1.00    Years: 20.00    Types: Cigarettes    Quit date: 02/03/2016  . Smokeless tobacco: Never Used  . Alcohol use No  . Drug use: Yes    Types: Marijuana     Comment: smokes THC daily per pt   . Sexual activity: No   Other Topics Concern  . Not on file   Social History Narrative  . No narrative on file    Family History  Problem Relation Age of Onset  . COPD Mother   . Diabetes Mother   . Heart failure Mother   . Alcohol abuse  Father   . Kidney disease Father   . Kidney failure Father   . Arthritis Sister   . CAD Maternal Grandmother   . Stroke Maternal Grandfather   . Diabetes Sister   . Arthritis Sister   . Arthritis Brother      Current Outpatient Prescriptions:  .  AVONEX PEN 30 MCG/0.5ML AJKT, Inject 30 mcg into the skin every Monday., Disp: , Rfl:  .  buPROPion (WELLBUTRIN XL) 300 MG 24 hr tablet, Take 300 mg by mouth every morning. , Disp: , Rfl:  .  cephALEXin (KEFLEX) 500 MG capsule, Take 1 capsule (500 mg total) by mouth 2 (two) times daily., Disp: 20 capsule, Rfl: 0 .  clonazePAM (KLONOPIN) 1 MG tablet, Take 1 tablet by mouth at bedtime. , Disp: , Rfl:  .  DULoxetine (CYMBALTA) 60 MG  capsule, Take 60 mg by mouth every morning. , Disp: , Rfl:  .  fluticasone furoate-vilanterol (BREO ELLIPTA) 100-25 MCG/INH AEPB, Inhale 1 puff into the lungs daily., Disp: 1 each, Rfl: 11 .  gabapentin (NEURONTIN) 600 MG tablet, Take 1 tablet (600 mg total) by mouth 3 (three) times daily., Disp: 90 tablet, Rfl: 2 .  hydrochlorothiazide (MICROZIDE) 12.5 MG capsule, Take 12.5 mg by mouth daily., Disp: , Rfl:  .  methylphenidate (RITALIN) 10 MG tablet, Take 10 mg by mouth 2 (two) times daily. , Disp: , Rfl: 0 .  Multiple Vitamin (MULTIVITAMIN WITH MINERALS) TABS tablet, Take 1 tablet by mouth daily., Disp: , Rfl:  .  polyethylene glycol powder (GLYCOLAX/MIRALAX) powder, Take 17 g by mouth daily. Mixed in water (Patient taking differently: Take 17 g by mouth as needed. Mixed in water), Disp: 850 g, Rfl: 2 .  potassium chloride (K-DUR) 10 MEQ tablet, Take 1 tablet (10 mEq total) by mouth daily., Disp: 90 tablet, Rfl: 1 .  QUEtiapine (SEROQUEL) 100 MG tablet, Take 100 mg by mouth at bedtime., Disp: , Rfl:  .  solifenacin (VESICARE) 5 MG tablet, Take 5 mg by mouth daily., Disp: , Rfl:  .  tiotropium (SPIRIVA) 18 MCG inhalation capsule, Place 18 mcg into inhaler and inhale daily., Disp: , Rfl:  .  tiZANidine (ZANAFLEX) 4 MG tablet, Take 4 mg by mouth 4 (four) times daily. TAKES FOR MS, Disp: , Rfl:  .  topiramate (TOPAMAX) 50 MG tablet, Take 1 tablet (50 mg total) by mouth 2 (two) times daily., Disp: 60 tablet, Rfl: 2 .  traMADol (ULTRAM) 50 MG tablet, Take 1 tablet (50 mg total) by mouth every 6 (six) hours as needed. (Patient taking differently: Take 50 mg by mouth every 6 (six) hours as needed (for pain). ), Disp: 20 tablet, Rfl: 0 .  triamterene-hydrochlorothiazide (MAXZIDE) 75-50 MG tablet, Take 1 tablet by mouth daily., Disp: , Rfl:   Physical exam:  Vitals:   08/06/16 1045  BP: 103/69  Pulse: (!) 123  Resp: 20  Temp: 97.9 F (36.6 C)  TempSrc: Tympanic   Physical Exam  Constitutional: She  is oriented to person, place, and time.  She is morbidly obese. Sitting in a wheelchair. Fatigued and lethargic  HENT:  Head: Normocephalic and atraumatic.  Eyes: EOM are normal. Pupils are equal, round, and reactive to light.  Neck: Normal range of motion.  Cardiovascular: Regular rhythm and normal heart sounds.   tachycardic  Pulmonary/Chest: Effort normal and breath sounds normal.  Abdominal: Soft. Bowel sounds are normal.  Musculoskeletal: She exhibits edema (LLE is diffusely erythematous and swollen).  Lymphadenopathy:  LN  exam limited due to body habitus. Palpable left inguinal adenopathy  Neurological: She is alert and oriented to person, place, and time.  Skin: Skin is warm and dry.     CMP Latest Ref Rng & Units 07/25/2016  Glucose 65 - 99 mg/dL 97  BUN 6 - 20 mg/dL 19  Creatinine 0.44 - 1.00 mg/dL 1.50(H)  Sodium 135 - 145 mmol/L 137  Potassium 3.5 - 5.1 mmol/L 3.4(L)  Chloride 101 - 111 mmol/L 101  CO2 22 - 32 mmol/L 29  Calcium 8.9 - 10.3 mg/dL 9.1  Total Protein 6.5 - 8.1 g/dL -  Total Bilirubin 0.3 - 1.2 mg/dL -  Alkaline Phos 38 - 126 U/L -  AST 15 - 41 U/L -  ALT 14 - 54 U/L -   CBC Latest Ref Rng & Units 07/25/2016  WBC 3.6 - 11.0 K/uL 6.5  Hemoglobin 12.0 - 16.0 g/dL 12.6  Hematocrit 35.0 - 47.0 % 36.7  Platelets 150 - 440 K/uL 235    No images are attached to the encounter.  Dg Chest 1 View  Result Date: 07/22/2016 CLINICAL DATA:  Port-A-Cath insertion EXAM: CHEST 1 VIEW COMPARISON:  None. FINDINGS: Left Port-A-Cath has been placed. The tip is at the confluence of the innominate veins. No pneumothorax. There are low lung volumes with bibasilar atelectasis. Heart is borderline in size. IMPRESSION: Left Port-A-Cath tip at the confluence of the innominate veins. No pneumothorax. Low lung volumes, bibasilar atelectasis. Electronically Signed   By: Rolm Baptise M.D.   On: 07/22/2016 10:44   Nm Pet Image Initial (pi) Skull Base To Thigh  Result Date:  07/16/2016 CLINICAL DATA:  Initial treatment strategy for undifferentiated B-cell lymphoma. EXAM: NUCLEAR MEDICINE PET SKULL BASE TO THIGH TECHNIQUE: 12.2 mCi F-18 FDG was injected intravenously. Full-ring PET imaging was performed from the skull base to thigh after the radiotracer. CT data was obtained and used for attenuation correction and anatomic localization. FASTING BLOOD GLUCOSE:  Value: 94 mg/dl COMPARISON:  CT scan from 06/14/2016 FINDINGS: NECK Suspected remote infarct in the left posterior cerebellum. Accentuated activity along the right lateral pterygoid muscle, maximum SUV 7.6, no as symmetric CT correlate, nonspecific but possibly physiologic. Physiologic activity along musculature in the upper neck CHEST Right lower paratracheal adenopathy, short axis diameter 1.4 cm on image 77/3, maximum SUV 6.6. Background mediastinal blood pool activity SUV 4.9. Coronary atherosclerosis. Trace left pleural effusion. No appreciable significant pulmonary nodule. Airway thickening is present, suggesting bronchitis or reactive airways disease. ABDOMEN/PELVIS Hypermetabolic retroperitoneal, common iliac, left external iliac, and left inguinal adenopathy observed. Confluent left external iliac adenopathy short axis diameter 5.2 cm with maximum SUV 16.4. Small anterior fluid collection along the left inguinal adenopathy likely related to recent biopsy. Background hepatic metabolic activity 5.0. No hypermetabolic activity or enlargement of the spleen. Aortoiliac atherosclerotic vascular disease. Postoperative findings in the lumbar spine. On image 197 of series 3 there is an omental nodule anteriorly measuring 1.2 by 1.5 cm, this is not hypermetabolic. There is some mild wall thickening in the rectum without significant hypermetabolic activity. Foley catheter is present in the urinary bladder. Mild deformity of the left iliac crest. Prominent stool throughout the colon favors constipation. SKELETON Late healing fractures  of the left sixth, seventh, eighth, and ninth ribs anteriorly without a significant degree of associated hypermetabolic activity. IMPRESSION: 1. Pathologic and hypermetabolic lower retroperitoneal, pelvic, and inguinal adenopathy compatible with Deauville 5 malignancy. 2. Mildly prominent right lower paratracheal lymph node, Deauville 4. 3. Accentuated activity  in the right lateral pterygoid muscle without CT correlate, probably incidental, merits attention on any follow up. 4. Other imaging findings of potential clinical significance: Airway thickening is present, suggesting bronchitis or reactive airways disease. Trace left pleural effusion. Coronary and aortoiliac atherosclerosis. Prominent stool throughout the colon favors constipation. Late phase healing fractures of left lower anterior ribs. Electronically Signed   By: Van Clines M.D.   On: 07/16/2016 14:17   US Venous Img Lower Unilateral Left  Result Date: 07/25/2016 CLINICAL DATA:  Patient fell on right knee.  Swelling. EXAM: Left  LOWER EXTREMITY VENOUS DOPPLER ULTRASOUND TECHNIQUE: Gray-scale sonography with graded compression, as well as color Doppler and duplex ultrasound were performed to evaluate the lower extremity deep venous systems from the level of the common femoral vein and including the common femoral, femoral, profunda femoral, popliteal and calf veins including the posterior tibial, peroneal and gastrocnemius veins when visible. The superficial great saphenous vein was also interrogated. Spectral Doppler was utilized to evaluate flow at rest and with distal augmentation maneuvers in the common femoral, femoral and popliteal veins. COMPARISON:  02/28/2016 FINDINGS: Contralateral Common Femoral Vein: Respiratory phasicity is normal and symmetric with the symptomatic side. No evidence of thrombus. Normal compressibility. Common Femoral Vein: No evidence of thrombus. Normal compressibility, respiratory phasicity and response to  augmentation. Saphenofemoral Junction: No evidence of thrombus. Normal compressibility and flow on color Doppler imaging. Profunda Femoral Vein: No evidence of thrombus. Normal compressibility and flow on color Doppler imaging. Femoral Vein: No evidence of thrombus. Normal compressibility, respiratory phasicity and response to augmentation. Popliteal Vein: No evidence of thrombus. Normal compressibility, respiratory phasicity and response to augmentation. Calf Veins: No evidence of thrombus. Normal compressibility and flow on color Doppler imaging. Other Findings: Hypoechoic lesion measuring 5.9 x 4.1 x 5.6 cm identified in the left groin area. IMPRESSION: 1. No evidence for DVT in the left lower extremity. 2. Hypoechoic lesion left groin, potentially lymph node or cystic mass. Electronically Signed   By: Misty Stanley M.D.   On: 07/25/2016 16:48   Dg Knee Complete 4 Views Right  Result Date: 07/25/2016 CLINICAL DATA:  Pain following fall EXAM: RIGHT KNEE - COMPLETE 4+ VIEW COMPARISON:  None. FINDINGS: Frontal, lateral, and bilateral oblique views were obtained. There is no demonstrable fracture or dislocation. No appreciable joint effusion. There is moderate generalized osteoarthritic change. There is spurring in all compartments as well as mild generalized joint space narrowing. There is chondrocalcinosis. No erosive change. IMPRESSION: Moderate generalized osteoarthritic change. For calcinosis is present which may be seen with osteoarthritis or with calcium pyrophosphate deposition disease. No demonstrable fracture or joint effusion. Electronically Signed   By: Lowella Grip III M.D.   On: 07/25/2016 16:30   Ct Bone Marrow Biopsy & Aspiration  Result Date: 07/18/2016 INDICATION: Concern for lymphoma. Please perform CT-guided bone marrow biopsy and aspiration for tissue diagnostic purposes. EXAM: CT-GUIDED BONE MARROW BIOPSY AND ASPIRATION MEDICATIONS: None ANESTHESIA/SEDATION: Fentanyl 50 mcg IV;  Versed 1 mg IV Sedation Time: 12 minutes; The patient was continuously monitored during the procedure by the interventional radiology nurse under my direct supervision. COMPLICATIONS: None immediate. PROCEDURE: Informed consent was obtained from the patient following an explanation of the procedure, risks, benefits and alternatives. The patient understands, agrees and consents for the procedure. All questions were addressed. A time out was performed prior to the initiation of the procedure. The patient was positioned prone and non-contrast localization CT was performed of the pelvis to demonstrate the iliac marrow  spaces. The operative site was prepped and draped in the usual sterile fashion. Under sterile conditions and local anesthesia, a 22 gauge spinal needle was utilized for procedural planning. Next, an 11 gauge coaxial bone biopsy needle was advanced into the left iliac marrow space. Needle position was confirmed with CT imaging. Initially, bone marrow aspiration was performed. Next, a bone marrow biopsy was obtained with the 11 gauge outer bone marrow device. Samples were prepared with the cytotechnologist and deemed adequate. The needle was removed intact. Hemostasis was obtained with compression and a dressing was placed. The patient tolerated the procedure well without immediate post procedural complication. IMPRESSION: Successful CT guided left iliac bone marrow aspiration and core biopsy. Electronically Signed   By: Sandi Mariscal M.D.   On: 07/18/2016 17:39   Dg C-arm 1-60 Min-no Report  Result Date: 07/22/2016 Fluoroscopy was utilized by the requesting physician.  No radiographic interpretation.     Assessment and plan- Patient is a 66 y.o. female with newly diagnosed Stage II follicular lymphoma with bulky inguinal adenopathy low FLIPI score of 1  I discussed the results of the PET/CT scan, lymph node biopsy and bone marrow biopsy with the patient in detail. Patient does have significant  intra-abdominal adenopathy especially inguinal adenopathy which is the area of her concern. Her baseline performance status is poor secondary to morbid obesity as well as multiple sclerosis. I discussed the follicular lymphoma is not curable with treatment. I would favor using single agent rituxan given her bulky adenopathy at 3 75 mg/m IV weekly 4 doses. We will repeat CT abdomen following 4 doses ofr ituxan and if there is still pe I will refer her to radiation oncology at that time rsistent adenopathy I will refer her to rad oncology at that time. She falls in the low risk group based on her FLIP score her 5 yr OS from her lymphoma is excellent. I discussed the risks and benefits of Rituxan including all but not limited to fatigue, low blood counts, risk of infusion reactions and delayed neutropenia. Patient understands and agrees to proceed    Visit Diagnosis 1. Follicular lymphoma of intra-abdominal lymph nodes, unspecified follicular lymphoma type ([place])   2. Encounter for monitoring rituximab therapy      Dr. Randa Evens, MD, MPH [facility] Pager- [phone] 08/06/2016 1:19 PM

## 2016-08-07 ENCOUNTER — Other Ambulatory Visit: Payer: Self-pay | Admitting: Family Medicine

## 2016-08-07 DIAGNOSIS — M5416 Radiculopathy, lumbar region: Secondary | ICD-10-CM | POA: Diagnosis not present

## 2016-08-07 DIAGNOSIS — M1991 Primary osteoarthritis, unspecified site: Secondary | ICD-10-CM | POA: Diagnosis not present

## 2016-08-07 DIAGNOSIS — G4733 Obstructive sleep apnea (adult) (pediatric): Secondary | ICD-10-CM | POA: Diagnosis not present

## 2016-08-07 DIAGNOSIS — G629 Polyneuropathy, unspecified: Secondary | ICD-10-CM | POA: Diagnosis not present

## 2016-08-07 DIAGNOSIS — G35 Multiple sclerosis: Secondary | ICD-10-CM | POA: Diagnosis not present

## 2016-08-07 DIAGNOSIS — I2729 Other secondary pulmonary hypertension: Secondary | ICD-10-CM | POA: Diagnosis not present

## 2016-08-07 DIAGNOSIS — G501 Atypical facial pain: Secondary | ICD-10-CM | POA: Diagnosis not present

## 2016-08-07 DIAGNOSIS — I739 Peripheral vascular disease, unspecified: Secondary | ICD-10-CM | POA: Diagnosis not present

## 2016-08-07 DIAGNOSIS — J449 Chronic obstructive pulmonary disease, unspecified: Secondary | ICD-10-CM | POA: Diagnosis not present

## 2016-08-07 DIAGNOSIS — G43909 Migraine, unspecified, not intractable, without status migrainosus: Secondary | ICD-10-CM | POA: Diagnosis not present

## 2016-08-07 DIAGNOSIS — I950 Idiopathic hypotension: Secondary | ICD-10-CM | POA: Diagnosis not present

## 2016-08-07 DIAGNOSIS — I10 Essential (primary) hypertension: Secondary | ICD-10-CM | POA: Diagnosis not present

## 2016-08-07 NOTE — Assessment & Plan Note (Signed)
Supportive listening provided; encourged her to talk with her psychiatrist about all that is going on

## 2016-08-07 NOTE — Assessment & Plan Note (Signed)
Managed by oncologist; biopsy site appears to be healing well

## 2016-08-07 NOTE — Assessment & Plan Note (Signed)
Impacting her ability to do ADLs, IADLs; discussed limitations at home, need for assistance; believe she needs nursing home or at the very least assisted living placement; I spoke with gentleman Education officer, museum who was at her home this morning; he will try to help with placement asap

## 2016-08-07 NOTE — Assessment & Plan Note (Signed)
With indwelling foley catheter; nurse practitioner helped her with her collection bag situation today

## 2016-08-08 ENCOUNTER — Other Ambulatory Visit: Payer: Medicare Other

## 2016-08-08 DIAGNOSIS — M1991 Primary osteoarthritis, unspecified site: Secondary | ICD-10-CM | POA: Diagnosis not present

## 2016-08-08 DIAGNOSIS — I950 Idiopathic hypotension: Secondary | ICD-10-CM | POA: Diagnosis not present

## 2016-08-08 DIAGNOSIS — J449 Chronic obstructive pulmonary disease, unspecified: Secondary | ICD-10-CM | POA: Diagnosis not present

## 2016-08-08 DIAGNOSIS — G4733 Obstructive sleep apnea (adult) (pediatric): Secondary | ICD-10-CM | POA: Diagnosis not present

## 2016-08-08 DIAGNOSIS — M5416 Radiculopathy, lumbar region: Secondary | ICD-10-CM | POA: Diagnosis not present

## 2016-08-08 DIAGNOSIS — G43909 Migraine, unspecified, not intractable, without status migrainosus: Secondary | ICD-10-CM | POA: Diagnosis not present

## 2016-08-08 DIAGNOSIS — G629 Polyneuropathy, unspecified: Secondary | ICD-10-CM | POA: Diagnosis not present

## 2016-08-08 DIAGNOSIS — I739 Peripheral vascular disease, unspecified: Secondary | ICD-10-CM | POA: Diagnosis not present

## 2016-08-08 DIAGNOSIS — G501 Atypical facial pain: Secondary | ICD-10-CM | POA: Diagnosis not present

## 2016-08-08 DIAGNOSIS — I10 Essential (primary) hypertension: Secondary | ICD-10-CM | POA: Diagnosis not present

## 2016-08-08 DIAGNOSIS — I2729 Other secondary pulmonary hypertension: Secondary | ICD-10-CM | POA: Diagnosis not present

## 2016-08-08 DIAGNOSIS — G35 Multiple sclerosis: Secondary | ICD-10-CM | POA: Diagnosis not present

## 2016-08-09 NOTE — Patient Instructions (Signed)

## 2016-08-13 ENCOUNTER — Inpatient Hospital Stay: Payer: Medicare Other

## 2016-08-13 DIAGNOSIS — G43909 Migraine, unspecified, not intractable, without status migrainosus: Secondary | ICD-10-CM | POA: Diagnosis not present

## 2016-08-13 DIAGNOSIS — J449 Chronic obstructive pulmonary disease, unspecified: Secondary | ICD-10-CM | POA: Diagnosis not present

## 2016-08-13 DIAGNOSIS — G35 Multiple sclerosis: Secondary | ICD-10-CM | POA: Diagnosis not present

## 2016-08-13 DIAGNOSIS — G629 Polyneuropathy, unspecified: Secondary | ICD-10-CM | POA: Diagnosis not present

## 2016-08-13 DIAGNOSIS — I10 Essential (primary) hypertension: Secondary | ICD-10-CM | POA: Diagnosis not present

## 2016-08-13 DIAGNOSIS — G501 Atypical facial pain: Secondary | ICD-10-CM | POA: Diagnosis not present

## 2016-08-13 DIAGNOSIS — I2729 Other secondary pulmonary hypertension: Secondary | ICD-10-CM | POA: Diagnosis not present

## 2016-08-13 DIAGNOSIS — I950 Idiopathic hypotension: Secondary | ICD-10-CM | POA: Diagnosis not present

## 2016-08-13 DIAGNOSIS — G4733 Obstructive sleep apnea (adult) (pediatric): Secondary | ICD-10-CM | POA: Diagnosis not present

## 2016-08-13 DIAGNOSIS — M1991 Primary osteoarthritis, unspecified site: Secondary | ICD-10-CM | POA: Diagnosis not present

## 2016-08-13 DIAGNOSIS — M5416 Radiculopathy, lumbar region: Secondary | ICD-10-CM | POA: Diagnosis not present

## 2016-08-13 DIAGNOSIS — I739 Peripheral vascular disease, unspecified: Secondary | ICD-10-CM | POA: Diagnosis not present

## 2016-08-14 ENCOUNTER — Telehealth: Payer: Self-pay | Admitting: *Deleted

## 2016-08-14 ENCOUNTER — Other Ambulatory Visit: Payer: Self-pay | Admitting: *Deleted

## 2016-08-14 MED ORDER — LIDOCAINE-PRILOCAINE 2.5-2.5 % EX CREA: 1.0000 "application " | TOPICAL_CREAM | CUTANEOUS | 1 refills | Status: DC | PRN

## 2016-08-14 NOTE — Telephone Encounter (Signed)
Called patient to give her the name of the urologists that inserted her foley. She informed me that her home health nurse had reinserted a foley catheter two weeks ago.

## 2016-08-14 NOTE — Telephone Encounter (Signed)
Told pt that sherry would send emla cream in for her to use with port for treatmment.  I called the rx into her pharmacy [facility] in [place].

## 2016-08-15 ENCOUNTER — Inpatient Hospital Stay: Payer: Medicare Other

## 2016-08-15 ENCOUNTER — Other Ambulatory Visit: Payer: Self-pay | Admitting: Oncology

## 2016-08-15 VITALS — BP 110/69 | HR 60 | Temp 96.9°F | Resp 20

## 2016-08-15 DIAGNOSIS — R5383 Other fatigue: Secondary | ICD-10-CM | POA: Diagnosis not present

## 2016-08-15 DIAGNOSIS — R609 Edema, unspecified: Secondary | ICD-10-CM | POA: Diagnosis not present

## 2016-08-15 DIAGNOSIS — M419 Scoliosis, unspecified: Secondary | ICD-10-CM | POA: Diagnosis not present

## 2016-08-15 DIAGNOSIS — Z79899 Other long term (current) drug therapy: Secondary | ICD-10-CM | POA: Diagnosis not present

## 2016-08-15 DIAGNOSIS — I739 Peripheral vascular disease, unspecified: Secondary | ICD-10-CM | POA: Diagnosis not present

## 2016-08-15 DIAGNOSIS — Z87891 Personal history of nicotine dependence: Secondary | ICD-10-CM | POA: Diagnosis not present

## 2016-08-15 DIAGNOSIS — R531 Weakness: Secondary | ICD-10-CM | POA: Diagnosis not present

## 2016-08-15 DIAGNOSIS — I708 Atherosclerosis of other arteries: Secondary | ICD-10-CM | POA: Diagnosis not present

## 2016-08-15 DIAGNOSIS — Z5112 Encounter for antineoplastic immunotherapy: Secondary | ICD-10-CM | POA: Diagnosis not present

## 2016-08-15 DIAGNOSIS — R0602 Shortness of breath: Secondary | ICD-10-CM | POA: Diagnosis not present

## 2016-08-15 DIAGNOSIS — C8293 Follicular lymphoma, unspecified, intra-abdominal lymph nodes: Secondary | ICD-10-CM

## 2016-08-15 DIAGNOSIS — I10 Essential (primary) hypertension: Secondary | ICD-10-CM | POA: Diagnosis not present

## 2016-08-15 DIAGNOSIS — K5900 Constipation, unspecified: Secondary | ICD-10-CM | POA: Diagnosis not present

## 2016-08-15 DIAGNOSIS — J449 Chronic obstructive pulmonary disease, unspecified: Secondary | ICD-10-CM | POA: Diagnosis not present

## 2016-08-15 DIAGNOSIS — R590 Localized enlarged lymph nodes: Secondary | ICD-10-CM | POA: Diagnosis not present

## 2016-08-15 DIAGNOSIS — G35 Multiple sclerosis: Secondary | ICD-10-CM | POA: Diagnosis not present

## 2016-08-15 LAB — CBC WITH DIFFERENTIAL/PLATELET
Basophils Absolute: 0.1 10*3/uL (ref 0–0.1)
Basophils Relative: 1 %
Eosinophils Absolute: 0.4 10*3/uL (ref 0–0.7)
Eosinophils Relative: 6 %
HCT: 36.9 % (ref 35.0–47.0)
Hemoglobin: 12.7 g/dL (ref 12.0–16.0)
Lymphocytes Relative: 19 %
Lymphs Abs: 1.3 10*3/uL (ref 1.0–3.6)
MCH: 28.7 pg (ref 26.0–34.0)
MCHC: 34.4 g/dL (ref 32.0–36.0)
MCV: 83.4 fL (ref 80.0–100.0)
Monocytes Absolute: 0.4 10*3/uL (ref 0.2–0.9)
Monocytes Relative: 6 %
Neutro Abs: 4.5 10*3/uL (ref 1.4–6.5)
Neutrophils Relative %: 68 %
Platelets: 236 10*3/uL (ref 150–440)
RBC: 4.42 MIL/uL (ref 3.80–5.20)
RDW: 15 % — ABNORMAL HIGH (ref 11.5–14.5)
WBC: 6.7 10*3/uL (ref 3.6–11.0)

## 2016-08-15 LAB — COMPREHENSIVE METABOLIC PANEL
ALT: 15 U/L (ref 14–54)
AST: 21 U/L (ref 15–41)
Albumin: 3.8 g/dL (ref 3.5–5.0)
Alkaline Phosphatase: 75 U/L (ref 38–126)
Anion gap: 4 — ABNORMAL LOW (ref 5–15)
BUN: 25 mg/dL — ABNORMAL HIGH (ref 6–20)
CO2: 30 mmol/L (ref 22–32)
Calcium: 9.7 mg/dL (ref 8.9–10.3)
Chloride: 103 mmol/L (ref 101–111)
Creatinine, Ser: 1.53 mg/dL — ABNORMAL HIGH (ref 0.44–1.00)
GFR calc Af Amer: 40 mL/min — ABNORMAL LOW (ref >60)
GFR calc non Af Amer: 35 mL/min — ABNORMAL LOW (ref >60)
Glucose, Bld: 107 mg/dL — ABNORMAL HIGH (ref 65–99)
Potassium: 3.4 mmol/L — ABNORMAL LOW (ref 3.5–5.1)
Sodium: 137 mmol/L (ref 135–145)
Total Bilirubin: 0.5 mg/dL (ref 0.3–1.2)
Total Protein: 7.2 g/dL (ref 6.5–8.1)

## 2016-08-15 MED ORDER — HEPARIN SOD (PORK) LOCK FLUSH 100 UNIT/ML IV SOLN: 500.0000 [IU] | Freq: Once | INTRAVENOUS | Status: DC | PRN

## 2016-08-15 MED ORDER — SODIUM CHLORIDE 0.9 % IV SOLN
Freq: Once | INTRAVENOUS | Status: AC
  Administered 2016-08-15: 09:00:00 via INTRAVENOUS
  Filled 2016-08-15: qty 1000

## 2016-08-15 MED ORDER — HEPARIN SOD (PORK) LOCK FLUSH 100 UNIT/ML IV SOLN
500.0000 [IU] | Freq: Once | INTRAVENOUS | Status: AC
  Administered 2016-08-15: 500 [IU] via INTRAVENOUS

## 2016-08-15 MED ORDER — SODIUM CHLORIDE 0.9% FLUSH
10.0000 mL | INTRAVENOUS | Status: DC | PRN
  Administered 2016-08-15: 10 mL via INTRAVENOUS
  Filled 2016-08-15: qty 10

## 2016-08-15 MED ORDER — DIPHENHYDRAMINE HCL 25 MG PO CAPS
50.0000 mg | ORAL_CAPSULE | Freq: Once | ORAL | Status: AC
  Administered 2016-08-15: 50 mg via ORAL
  Filled 2016-08-15: qty 2

## 2016-08-15 MED ORDER — ACETAMINOPHEN 325 MG PO TABS
650.0000 mg | ORAL_TABLET | Freq: Once | ORAL | Status: AC
  Administered 2016-08-15: 650 mg via ORAL
  Filled 2016-08-15: qty 2

## 2016-08-15 MED ORDER — RITUXIMAB CHEMO INJECTION 500 MG/50ML
375.0000 mg/m2 | Freq: Once | INTRAVENOUS | Status: AC
  Administered 2016-08-15: 900 mg via INTRAVENOUS
  Filled 2016-08-15: qty 50

## 2016-08-15 MED ORDER — SODIUM CHLORIDE 0.9% FLUSH
10.0000 mL | INTRAVENOUS | Status: DC | PRN
  Filled 2016-08-15: qty 10

## 2016-08-16 DIAGNOSIS — M1991 Primary osteoarthritis, unspecified site: Secondary | ICD-10-CM | POA: Diagnosis not present

## 2016-08-16 DIAGNOSIS — M5416 Radiculopathy, lumbar region: Secondary | ICD-10-CM | POA: Diagnosis not present

## 2016-08-16 DIAGNOSIS — I739 Peripheral vascular disease, unspecified: Secondary | ICD-10-CM | POA: Diagnosis not present

## 2016-08-16 DIAGNOSIS — G501 Atypical facial pain: Secondary | ICD-10-CM | POA: Diagnosis not present

## 2016-08-16 DIAGNOSIS — G4733 Obstructive sleep apnea (adult) (pediatric): Secondary | ICD-10-CM | POA: Diagnosis not present

## 2016-08-16 DIAGNOSIS — J449 Chronic obstructive pulmonary disease, unspecified: Secondary | ICD-10-CM | POA: Diagnosis not present

## 2016-08-16 DIAGNOSIS — I10 Essential (primary) hypertension: Secondary | ICD-10-CM | POA: Diagnosis not present

## 2016-08-16 DIAGNOSIS — G629 Polyneuropathy, unspecified: Secondary | ICD-10-CM | POA: Diagnosis not present

## 2016-08-16 DIAGNOSIS — I2729 Other secondary pulmonary hypertension: Secondary | ICD-10-CM | POA: Diagnosis not present

## 2016-08-16 DIAGNOSIS — G43909 Migraine, unspecified, not intractable, without status migrainosus: Secondary | ICD-10-CM | POA: Diagnosis not present

## 2016-08-16 DIAGNOSIS — I950 Idiopathic hypotension: Secondary | ICD-10-CM | POA: Diagnosis not present

## 2016-08-16 DIAGNOSIS — G35 Multiple sclerosis: Secondary | ICD-10-CM | POA: Diagnosis not present

## 2016-08-19 DIAGNOSIS — I950 Idiopathic hypotension: Secondary | ICD-10-CM | POA: Diagnosis not present

## 2016-08-19 DIAGNOSIS — G629 Polyneuropathy, unspecified: Secondary | ICD-10-CM | POA: Diagnosis not present

## 2016-08-19 DIAGNOSIS — J449 Chronic obstructive pulmonary disease, unspecified: Secondary | ICD-10-CM | POA: Diagnosis not present

## 2016-08-19 DIAGNOSIS — I2729 Other secondary pulmonary hypertension: Secondary | ICD-10-CM | POA: Diagnosis not present

## 2016-08-19 DIAGNOSIS — G35 Multiple sclerosis: Secondary | ICD-10-CM | POA: Diagnosis not present

## 2016-08-19 DIAGNOSIS — M5416 Radiculopathy, lumbar region: Secondary | ICD-10-CM | POA: Diagnosis not present

## 2016-08-19 DIAGNOSIS — G43909 Migraine, unspecified, not intractable, without status migrainosus: Secondary | ICD-10-CM | POA: Diagnosis not present

## 2016-08-19 DIAGNOSIS — I10 Essential (primary) hypertension: Secondary | ICD-10-CM | POA: Diagnosis not present

## 2016-08-19 DIAGNOSIS — I739 Peripheral vascular disease, unspecified: Secondary | ICD-10-CM | POA: Diagnosis not present

## 2016-08-19 DIAGNOSIS — G501 Atypical facial pain: Secondary | ICD-10-CM | POA: Diagnosis not present

## 2016-08-19 DIAGNOSIS — G4733 Obstructive sleep apnea (adult) (pediatric): Secondary | ICD-10-CM | POA: Diagnosis not present

## 2016-08-19 DIAGNOSIS — M1991 Primary osteoarthritis, unspecified site: Secondary | ICD-10-CM | POA: Diagnosis not present

## 2016-08-20 DIAGNOSIS — M1991 Primary osteoarthritis, unspecified site: Secondary | ICD-10-CM | POA: Diagnosis not present

## 2016-08-20 DIAGNOSIS — M5416 Radiculopathy, lumbar region: Secondary | ICD-10-CM | POA: Diagnosis not present

## 2016-08-20 DIAGNOSIS — I2729 Other secondary pulmonary hypertension: Secondary | ICD-10-CM | POA: Diagnosis not present

## 2016-08-20 DIAGNOSIS — G501 Atypical facial pain: Secondary | ICD-10-CM | POA: Diagnosis not present

## 2016-08-20 DIAGNOSIS — I10 Essential (primary) hypertension: Secondary | ICD-10-CM | POA: Diagnosis not present

## 2016-08-20 DIAGNOSIS — G4733 Obstructive sleep apnea (adult) (pediatric): Secondary | ICD-10-CM | POA: Diagnosis not present

## 2016-08-20 DIAGNOSIS — I739 Peripheral vascular disease, unspecified: Secondary | ICD-10-CM | POA: Diagnosis not present

## 2016-08-20 DIAGNOSIS — G43909 Migraine, unspecified, not intractable, without status migrainosus: Secondary | ICD-10-CM | POA: Diagnosis not present

## 2016-08-20 DIAGNOSIS — J449 Chronic obstructive pulmonary disease, unspecified: Secondary | ICD-10-CM | POA: Diagnosis not present

## 2016-08-20 DIAGNOSIS — G629 Polyneuropathy, unspecified: Secondary | ICD-10-CM | POA: Diagnosis not present

## 2016-08-20 DIAGNOSIS — G35 Multiple sclerosis: Secondary | ICD-10-CM | POA: Diagnosis not present

## 2016-08-20 DIAGNOSIS — I950 Idiopathic hypotension: Secondary | ICD-10-CM | POA: Diagnosis not present

## 2016-08-21 DIAGNOSIS — I739 Peripheral vascular disease, unspecified: Secondary | ICD-10-CM | POA: Diagnosis not present

## 2016-08-21 DIAGNOSIS — G501 Atypical facial pain: Secondary | ICD-10-CM | POA: Diagnosis not present

## 2016-08-21 DIAGNOSIS — M1991 Primary osteoarthritis, unspecified site: Secondary | ICD-10-CM | POA: Diagnosis not present

## 2016-08-21 DIAGNOSIS — I2729 Other secondary pulmonary hypertension: Secondary | ICD-10-CM | POA: Diagnosis not present

## 2016-08-21 DIAGNOSIS — G629 Polyneuropathy, unspecified: Secondary | ICD-10-CM | POA: Diagnosis not present

## 2016-08-21 DIAGNOSIS — M5416 Radiculopathy, lumbar region: Secondary | ICD-10-CM | POA: Diagnosis not present

## 2016-08-21 DIAGNOSIS — J449 Chronic obstructive pulmonary disease, unspecified: Secondary | ICD-10-CM | POA: Diagnosis not present

## 2016-08-21 DIAGNOSIS — G4733 Obstructive sleep apnea (adult) (pediatric): Secondary | ICD-10-CM | POA: Diagnosis not present

## 2016-08-21 DIAGNOSIS — I10 Essential (primary) hypertension: Secondary | ICD-10-CM | POA: Diagnosis not present

## 2016-08-21 DIAGNOSIS — I950 Idiopathic hypotension: Secondary | ICD-10-CM | POA: Diagnosis not present

## 2016-08-21 DIAGNOSIS — G43909 Migraine, unspecified, not intractable, without status migrainosus: Secondary | ICD-10-CM | POA: Diagnosis not present

## 2016-08-21 DIAGNOSIS — G35 Multiple sclerosis: Secondary | ICD-10-CM | POA: Diagnosis not present

## 2016-08-22 ENCOUNTER — Inpatient Hospital Stay (HOSPITAL_BASED_OUTPATIENT_CLINIC_OR_DEPARTMENT_OTHER): Payer: Medicare Other | Admitting: Oncology

## 2016-08-22 ENCOUNTER — Inpatient Hospital Stay: Payer: Medicare Other

## 2016-08-22 VITALS — BP 147/84 | HR 68 | Temp 95.9°F | Resp 20 | Wt 296.0 lb

## 2016-08-22 DIAGNOSIS — G501 Atypical facial pain: Secondary | ICD-10-CM | POA: Diagnosis not present

## 2016-08-22 DIAGNOSIS — Z79899 Other long term (current) drug therapy: Secondary | ICD-10-CM | POA: Diagnosis not present

## 2016-08-22 DIAGNOSIS — Z5181 Encounter for therapeutic drug level monitoring: Secondary | ICD-10-CM

## 2016-08-22 DIAGNOSIS — G629 Polyneuropathy, unspecified: Secondary | ICD-10-CM | POA: Diagnosis not present

## 2016-08-22 DIAGNOSIS — F419 Anxiety disorder, unspecified: Secondary | ICD-10-CM | POA: Diagnosis not present

## 2016-08-22 DIAGNOSIS — R0602 Shortness of breath: Secondary | ICD-10-CM | POA: Diagnosis not present

## 2016-08-22 DIAGNOSIS — C8293 Follicular lymphoma, unspecified, intra-abdominal lymph nodes: Secondary | ICD-10-CM

## 2016-08-22 DIAGNOSIS — I10 Essential (primary) hypertension: Secondary | ICD-10-CM

## 2016-08-22 DIAGNOSIS — R5383 Other fatigue: Secondary | ICD-10-CM | POA: Diagnosis not present

## 2016-08-22 DIAGNOSIS — M419 Scoliosis, unspecified: Secondary | ICD-10-CM

## 2016-08-22 DIAGNOSIS — E669 Obesity, unspecified: Secondary | ICD-10-CM

## 2016-08-22 DIAGNOSIS — G4733 Obstructive sleep apnea (adult) (pediatric): Secondary | ICD-10-CM | POA: Diagnosis not present

## 2016-08-22 DIAGNOSIS — Z5112 Encounter for antineoplastic immunotherapy: Secondary | ICD-10-CM | POA: Diagnosis not present

## 2016-08-22 DIAGNOSIS — J449 Chronic obstructive pulmonary disease, unspecified: Secondary | ICD-10-CM | POA: Diagnosis not present

## 2016-08-22 DIAGNOSIS — M1991 Primary osteoarthritis, unspecified site: Secondary | ICD-10-CM | POA: Diagnosis not present

## 2016-08-22 DIAGNOSIS — I739 Peripheral vascular disease, unspecified: Secondary | ICD-10-CM

## 2016-08-22 DIAGNOSIS — Z87891 Personal history of nicotine dependence: Secondary | ICD-10-CM | POA: Diagnosis not present

## 2016-08-22 DIAGNOSIS — R609 Edema, unspecified: Secondary | ICD-10-CM | POA: Diagnosis not present

## 2016-08-22 DIAGNOSIS — G35 Multiple sclerosis: Secondary | ICD-10-CM | POA: Diagnosis not present

## 2016-08-22 DIAGNOSIS — I708 Atherosclerosis of other arteries: Secondary | ICD-10-CM

## 2016-08-22 DIAGNOSIS — M5416 Radiculopathy, lumbar region: Secondary | ICD-10-CM | POA: Diagnosis not present

## 2016-08-22 DIAGNOSIS — I2729 Other secondary pulmonary hypertension: Secondary | ICD-10-CM | POA: Diagnosis not present

## 2016-08-22 DIAGNOSIS — K5900 Constipation, unspecified: Secondary | ICD-10-CM | POA: Diagnosis not present

## 2016-08-22 DIAGNOSIS — F909 Attention-deficit hyperactivity disorder, unspecified type: Secondary | ICD-10-CM | POA: Diagnosis not present

## 2016-08-22 DIAGNOSIS — Z79620 Long term (current) use of immunosuppressive biologic: Secondary | ICD-10-CM

## 2016-08-22 DIAGNOSIS — R531 Weakness: Secondary | ICD-10-CM

## 2016-08-22 DIAGNOSIS — F329 Major depressive disorder, single episode, unspecified: Secondary | ICD-10-CM | POA: Diagnosis not present

## 2016-08-22 DIAGNOSIS — G43909 Migraine, unspecified, not intractable, without status migrainosus: Secondary | ICD-10-CM | POA: Diagnosis not present

## 2016-08-22 DIAGNOSIS — I950 Idiopathic hypotension: Secondary | ICD-10-CM | POA: Diagnosis not present

## 2016-08-22 DIAGNOSIS — E871 Hypo-osmolality and hyponatremia: Secondary | ICD-10-CM

## 2016-08-22 DIAGNOSIS — R590 Localized enlarged lymph nodes: Secondary | ICD-10-CM

## 2016-08-22 LAB — CBC WITH DIFFERENTIAL/PLATELET
Basophils Absolute: 0.1 10*3/uL (ref 0–0.1)
Basophils Relative: 1 %
Eosinophils Absolute: 0.5 10*3/uL (ref 0–0.7)
Eosinophils Relative: 7 %
HCT: 35.8 % (ref 35.0–47.0)
Hemoglobin: 12.3 g/dL (ref 12.0–16.0)
Lymphocytes Relative: 17 %
Lymphs Abs: 1.2 10*3/uL (ref 1.0–3.6)
MCH: 28.3 pg (ref 26.0–34.0)
MCHC: 34.4 g/dL (ref 32.0–36.0)
MCV: 82.4 fL (ref 80.0–100.0)
Monocytes Absolute: 0.6 10*3/uL (ref 0.2–0.9)
Monocytes Relative: 9 %
Neutro Abs: 4.6 10*3/uL (ref 1.4–6.5)
Neutrophils Relative %: 66 %
Platelets: 234 10*3/uL (ref 150–440)
RBC: 4.34 MIL/uL (ref 3.80–5.20)
RDW: 15.1 % — ABNORMAL HIGH (ref 11.5–14.5)
WBC: 7 10*3/uL (ref 3.6–11.0)

## 2016-08-22 LAB — COMPREHENSIVE METABOLIC PANEL
ALT: 18 U/L (ref 14–54)
AST: 27 U/L (ref 15–41)
Albumin: 4.1 g/dL (ref 3.5–5.0)
Alkaline Phosphatase: 74 U/L (ref 38–126)
Anion gap: 8 (ref 5–15)
BUN: 23 mg/dL — ABNORMAL HIGH (ref 6–20)
CO2: 25 mmol/L (ref 22–32)
Calcium: 9 mg/dL (ref 8.9–10.3)
Chloride: 104 mmol/L (ref 101–111)
Creatinine, Ser: 1.43 mg/dL — ABNORMAL HIGH (ref 0.44–1.00)
GFR calc Af Amer: 43 mL/min — ABNORMAL LOW (ref >60)
GFR calc non Af Amer: 38 mL/min — ABNORMAL LOW (ref >60)
Glucose, Bld: 127 mg/dL — ABNORMAL HIGH (ref 65–99)
Potassium: 3 mmol/L — ABNORMAL LOW (ref 3.5–5.1)
Sodium: 137 mmol/L (ref 135–145)
Total Bilirubin: 0.6 mg/dL (ref 0.3–1.2)
Total Protein: 7.6 g/dL (ref 6.5–8.1)

## 2016-08-22 MED ORDER — DIPHENHYDRAMINE HCL 25 MG PO CAPS
50.0000 mg | ORAL_CAPSULE | Freq: Once | ORAL | Status: AC
  Administered 2016-08-22: 50 mg via ORAL
  Filled 2016-08-22: qty 2

## 2016-08-22 MED ORDER — SODIUM CHLORIDE 0.9% FLUSH
10.0000 mL | Freq: Once | INTRAVENOUS | Status: AC
  Administered 2016-08-22: 10 mL via INTRAVENOUS
  Filled 2016-08-22: qty 10

## 2016-08-22 MED ORDER — ACETAMINOPHEN 325 MG PO TABS
650.0000 mg | ORAL_TABLET | Freq: Once | ORAL | Status: AC
  Administered 2016-08-22: 650 mg via ORAL
  Filled 2016-08-22: qty 2

## 2016-08-22 MED ORDER — SODIUM CHLORIDE 0.9 % IV SOLN
Freq: Once | INTRAVENOUS | Status: AC
  Administered 2016-08-22: 10:00:00 via INTRAVENOUS
  Filled 2016-08-22: qty 1000

## 2016-08-22 MED ORDER — SODIUM CHLORIDE 0.9 % IV SOLN
375.0000 mg/m2 | Freq: Once | INTRAVENOUS | Status: AC
  Administered 2016-08-22: 900 mg via INTRAVENOUS
  Filled 2016-08-22: qty 50

## 2016-08-22 MED ORDER — HEPARIN SOD (PORK) LOCK FLUSH 100 UNIT/ML IV SOLN
500.0000 [IU] | Freq: Once | INTRAVENOUS | Status: AC
  Administered 2016-08-22: 500 [IU] via INTRAVENOUS
  Filled 2016-08-22: qty 5

## 2016-08-22 MED ORDER — SODIUM CHLORIDE 0.9 % IV SOLN: 375.0000 mg/m2 | Freq: Once | INTRAVENOUS | Status: DC

## 2016-08-22 MED ORDER — POTASSIUM CHLORIDE 2 MEQ/ML IV SOLN
INTRAVENOUS | Status: DC
  Administered 2016-08-22: 12:00:00 via INTRAVENOUS
  Filled 2016-08-22: qty 250

## 2016-08-22 NOTE — Progress Notes (Signed)
Hematology/Oncology Consult note [facility]  Telephone:([phone] [phone]  Patient Care Team: Arnetha Courser, MD as PCP - General (Family Medicine) Arnetha Courser, MD as Attending Physician (Family Medicine) Christene Lye, MD (General Surgery)   Name of the patient: [name]  [id number]  1951/03/12   Date of visit: 08/22/16  Diagnosis- Stage II low grade follicular lymphoma  Chief complaint/ Reason for visit- on treatment assessment prior to cycle 2 of weekly rituxan  Heme/Onc history: 1. patient is a 66 year old female who has had ongoing problems with pain in her left groin as well as his left leg swelling.   2. She had a Doppler of her left lower extremity on 02/28/2016 which showed: 1. Mild to moderate edema within the left calf. No evidence for acute DVT from left groin to popliteal fossa. Slightly limited evaluation of calf veins due to edema. 2. Left inguinal enlarged lymph nodes up to 6.2 cm, findings could be secondary to reactive node, infection, inflammation, or lymphoproliferative disease.  3. She had a repeat Doppler on 05/24/2016 which showed: IMPRESSION: 1. No evidence of lower extremity deep vein thrombosis, left. 2. Left inguinal lymphadenopathy. Differential diagnosis: Reactive adenopathy, lymphoma, metastatic disease.  4. CT abdomen on 06/14/2016 showed:   Vascular/Lymphatic: Aortic calcifications are noted without aneurysmal dilatation. Considerable lymphadenopathy is noted in the left inguinal region. The largest of these measures 6.1 by 3.0 cm in greatest dimension. This is similar to that seen on the prior ultrasound examination and remains suspicious. Additionally a lymphomatous mass is noted within the left hemipelvis which measures approximately 10 by 4.5 cm. Scattered left iliac and periaortic lymph nodes are seen. The periaortic lymph nodes are not significantly enlarged. The other left  iliac chain nodes measure upwards of 18 mm.   IMPRESSION: Lymphadenopathy within the left inguinal region and the left iliac chain. This is highly suspicious for underlying neoplasm. Biopsy is recommended for further evaluation.  5. Patient was seen by Dr. Jamal Collin from surgery and underwent excisional biopsy of the leftinguinal lymph node under ultrasound on 07/04/2016. Final pathology not reported yet. Dr. Luana Shu told me over the phone that this appears to be CD 20+ B cell lymphoma  6. Patient has multiple sclerosis and has h/o depression and anxiety. She was given clonopin this morning and currently patient is drowsy but able to be aroused. She drifts off to sleep in between conversation. She is here with her friend who helps her out. Patient lives with her husband at baseline and uses a walker to ambulate. Reports pain and discomfort in her left groin. Denies fevers, chills, night sweats or weight loss  7. PET/CT scan from 07/16/16 showed: IMPRESSION: 1. Pathologic and hypermetabolic lower retroperitoneal, pelvic, and inguinal adenopathy compatible with [place]au[place] 5 malignancy. 2. Mildly prominent right lower paratracheal lymph node, Deauville 4. 3. Accentuated activity in the right lateral pterygoid muscle without CT correlate, probably incidental, merits attention on any follow up. 4. Other imaging findings of potential clinical significance: Airway thickening is present, suggesting bronchitis or reactive airways disease. Trace left pleural effusion. Coronary and aortoiliac atherosclerosis. Prominent stool throughout the colon favors constipation. Late phase healing fractures of left lower anterior ribs.  8. Bone marrow biopsy from 07/18/2016 did not show any evidence of lymphoma. HIV and hepatitis B and hepatitis C testing was negative   Interval history- reports feeling fatigued. LLE is still swollen and painful. She can ambulate some with a walker. Feels fatigued.d enies  other complaints  ECOG PS- 3 Pain scale- 7  Review of systems- Review of Systems  Constitutional: Positive for malaise/fatigue. Negative for chills, fever and weight loss.  HENT: Negative for congestion, ear discharge and nosebleeds.   Eyes: Negative for blurred vision.  Respiratory: Negative for cough, hemoptysis, sputum production, shortness of breath and wheezing.   Cardiovascular: Negative for chest pain, palpitations, orthopnea and claudication.  Gastrointestinal: Negative for abdominal pain, blood in stool, constipation, diarrhea, heartburn, melena, nausea and vomiting.  Genitourinary: Negative for dysuria, flank pain, frequency, hematuria and urgency.  Musculoskeletal: Negative for back pain, joint pain and myalgias.       LLE pain  Skin: Negative for rash.  Neurological: Negative for dizziness, tingling, focal weakness, seizures, weakness and headaches.  Endo/Heme/Allergies: Does not bruise/bleed easily.  Psychiatric/Behavioral: Negative for depression and suicidal ideas. The patient does not have insomnia.       No Known Allergies   Past Medical History:  Diagnosis Date  . ADHD   . Anxiety   . COPD (chronic obstructive pulmonary disease) ([place])   . Depression    major depressive  . Dyspnea    doe  . Edema    left leg  . Follicular lymphoma ([place])    B Cell  . Hypertension   . Hypotension    idiopathic  . Kyphoscoliosis and scoliosis 11/26/2011  . Morbid obesity ([place]) 01/05/2016  . Multiple sclerosis ([place])   . Multiple sclerosis ([place])    1980's  . Neuromuscular disorder ([place])   . Obstructive and reflux uropathy    foley  . Pain    atypical facial  . Peripheral vascular disease of lower extremity with ulceration ([place]) 11/08/2015  . Skin ulcer ([place]) 11/08/2015  . Weakness    generalized. has MS     Past Surgical History:  Procedure Laterality Date  . BACK SURGERY N/A 2002  . CYST EXCISION     lower back  . INGUINAL LYMPH NODE BIOPSY Left 07/04/2016    Procedure: INGUINAL LYMPH NODE BIOPSY;  Surgeon: Christene Lye, MD;  Location: [facility] ORS;  Service: General;  Laterality: Left;  . PORTACATH PLACEMENT N/A 07/22/2016   Procedure: INSERTION PORT-A-CATH;  Surgeon: Christene Lye, MD;  Location: [facility] ORS;  Service: General;  Laterality: N/A;  . TONSILLECTOMY AND ADENOIDECTOMY    . TUBAL LIGATION      Social History   Social History  . Marital status: Married    Spouse name: N/A  . Number of children: 3  . Years of education: N/A   Occupational History  . disabled    Social History Main Topics  . Smoking status: Former Smoker    Packs/day: 1.00    Years: 20.00    Types: Cigarettes    Quit date: 02/03/2016  . Smokeless tobacco: Never Used  . Alcohol use No  . Drug use: Yes    Types: Marijuana     Comment: smokes THC daily per pt   . Sexual activity: No   Other Topics Concern  . Not on file   Social History Narrative  . No narrative on file    Family History  Problem Relation Age of Onset  . COPD Mother   . Diabetes Mother   . Heart failure Mother   . Alcohol abuse Father   . Kidney disease Father   . Kidney failure Father   . Arthritis Sister   . CAD Maternal Grandmother   . Stroke Maternal Grandfather   .  Diabetes Sister   . Arthritis Sister   . Arthritis Brother      Current Outpatient Prescriptions:  .  AVONEX PEN 30 MCG/0.5ML AJKT, Inject 30 mcg into the skin every Monday., Disp: , Rfl:  .  buPROPion (WELLBUTRIN XL) 300 MG 24 hr tablet, Take 300 mg by mouth every morning. , Disp: , Rfl:  .  clonazePAM (KLONOPIN) 1 MG tablet, Take 1 tablet by mouth at bedtime. , Disp: , Rfl:  .  DULoxetine (CYMBALTA) 60 MG capsule, Take 60 mg by mouth every morning. , Disp: , Rfl:  .  fluticasone furoate-vilanterol (BREO ELLIPTA) 100-25 MCG/INH AEPB, Inhale 1 puff into the lungs daily., Disp: 1 each, Rfl: 11 .  gabapentin (NEURONTIN) 600 MG tablet, TAKE 1 TABLET (600 MG TOTAL) BY MOUTH 3 (THREE) TIMES DAILY.,  Disp: 90 tablet, Rfl: 2 .  hydrochlorothiazide (MICROZIDE) 12.5 MG capsule, TAKE ONE CAPSULE (12.5 MG TOTAL) BY MOUTH EVERY DAY (THIS REPLACES TRIAMTERENE/HCTZ), Disp: 30 capsule, Rfl: 5 .  lidocaine-prilocaine (EMLA) cream, Apply 1 application topically as needed. apply cream over the port site and then place small piece of saran wrap over the cream to protect your clothing, Disp: 30 g, Rfl: 1 .  methylphenidate (RITALIN) 10 MG tablet, Take 10 mg by mouth 2 (two) times daily. , Disp: , Rfl: 0 .  Multiple Vitamin (MULTIVITAMIN WITH MINERALS) TABS tablet, Take 1 tablet by mouth daily., Disp: , Rfl:  .  polyethylene glycol powder (GLYCOLAX/MIRALAX) powder, Take 17 g by mouth daily. Mixed in water (Patient taking differently: Take 17 g by mouth as needed. Mixed in water), Disp: 850 g, Rfl: 2 .  potassium chloride (K-DUR) 10 MEQ tablet, Take 1 tablet (10 mEq total) by mouth daily., Disp: 90 tablet, Rfl: 1 .  QUEtiapine Fumarate (SEROQUEL XR) 150 MG 24 hr tablet, , Disp: , Rfl: 4 .  solifenacin (VESICARE) 5 MG tablet, Take 5 mg by mouth daily., Disp: , Rfl:  .  topiramate (TOPAMAX) 50 MG tablet, TAKE 1 TABLET (50 MG TOTAL) BY MOUTH 2 (TWO) TIMES DAILY., Disp: 60 tablet, Rfl: 2 .  tiotropium (SPIRIVA) 18 MCG inhalation capsule, Place 18 mcg into inhaler and inhale daily., Disp: , Rfl:  .  tiZANidine (ZANAFLEX) 4 MG tablet, Take 4 mg by mouth 4 (four) times daily. TAKES FOR MS, Disp: , Rfl:  .  traMADol (ULTRAM) 50 MG tablet, Take 1 tablet (50 mg total) by mouth every 6 (six) hours as needed. (Patient not taking: Reported on 08/22/2016), Disp: 20 tablet, Rfl: 0 .  triamterene-hydrochlorothiazide (MAXZIDE) 75-50 MG tablet, Take 1 tablet by mouth daily., Disp: , Rfl:  No current facility-administered medications for this visit.   Facility-Administered Medications Ordered in Other Visits:  .  heparin lock flush 100 unit/mL, 500 Units, Intravenous, Once, Sindy Guadeloupe, MD .  riTUXimab (RITUXAN) 900 mg in  sodium chloride 0.9 % 160 mL chemo infusion, 375 mg/m2, Intravenous, Once, Sindy Guadeloupe, MD  Physical exam:  Vitals:   08/22/16 0907  BP: (!) 147/84  Pulse: 68  Resp: 20  Temp: (!) 95.9 F (35.5 C)  TempSrc: Tympanic  Weight: 296 lb (134.3 kg)   Physical Exam  Constitutional: She is oriented to person, place, and time.  Obese, sitting in a wheelchair. Appears fatigued  HENT:  Head: Normocephalic and atraumatic.  Eyes: EOM are normal. Pupils are equal, round, and reactive to light.  Neck: Normal range of motion.  Cardiovascular: Normal rate, regular rhythm and normal heart  sounds.   Pulmonary/Chest: Effort normal and breath sounds normal.  Abdominal: Soft. Bowel sounds are normal.  Palpable left inguinal adenopathy. Likely smaller than before but difficult to assess due to obesity and body habitus  Musculoskeletal:  LLE chronic erythema and swelling  Neurological: She is alert and oriented to person, place, and time.  Skin: Skin is warm and dry.     CMP Latest Ref Rng & Units 08/22/2016  Glucose 65 - 99 mg/dL 127(H)  BUN 6 - 20 mg/dL 23(H)  Creatinine 0.44 - 1.00 mg/dL 1.43(H)  Sodium 135 - 145 mmol/L 137  Potassium 3.5 - 5.1 mmol/L 3.0(L)  Chloride 101 - 111 mmol/L 104  CO2 22 - 32 mmol/L 25  Calcium 8.9 - 10.3 mg/dL 9.0  Total Protein 6.5 - 8.1 g/dL 7.6  Total Bilirubin 0.3 - 1.2 mg/dL 0.6  Alkaline Phos 38 - 126 U/L 74  AST 15 - 41 U/L 27  ALT 14 - 54 U/L 18   CBC Latest Ref Rng & Units 08/22/2016  WBC 3.6 - 11.0 K/uL 7.0  Hemoglobin 12.0 - 16.0 g/dL 12.3  Hematocrit 35.0 - 47.0 % 35.8  Platelets 150 - 440 K/uL 234    No images are attached to the encounter.  US Venous Img Lower Unilateral Left  Result Date: 07/25/2016 CLINICAL DATA:  Patient fell on right knee.  Swelling. EXAM: Left  LOWER EXTREMITY VENOUS DOPPLER ULTRASOUND TECHNIQUE: Gray-scale sonography with graded compression, as well as color Doppler and duplex ultrasound were performed to evaluate the  lower extremity deep venous systems from the level of the common femoral vein and including the common femoral, femoral, profunda femoral, popliteal and calf veins including the posterior tibial, peroneal and gastrocnemius veins when visible. The superficial great saphenous vein was also interrogated. Spectral Doppler was utilized to evaluate flow at rest and with distal augmentation maneuvers in the common femoral, femoral and popliteal veins. COMPARISON:  02/28/2016 FINDINGS: Contralateral Common Femoral Vein: Respiratory phasicity is normal and symmetric with the symptomatic side. No evidence of thrombus. Normal compressibility. Common Femoral Vein: No evidence of thrombus. Normal compressibility, respiratory phasicity and response to augmentation. Saphenofemoral Junction: No evidence of thrombus. Normal compressibility and flow on color Doppler imaging. Profunda Femoral Vein: No evidence of thrombus. Normal compressibility and flow on color Doppler imaging. Femoral Vein: No evidence of thrombus. Normal compressibility, respiratory phasicity and response to augmentation. Popliteal Vein: No evidence of thrombus. Normal compressibility, respiratory phasicity and response to augmentation. Calf Veins: No evidence of thrombus. Normal compressibility and flow on color Doppler imaging. Other Findings: Hypoechoic lesion measuring 5.9 x 4.1 x 5.6 cm identified in the left groin area. IMPRESSION: 1. No evidence for DVT in the left lower extremity. 2. Hypoechoic lesion left groin, potentially lymph node or cystic mass. Electronically Signed   By: Misty Stanley M.D.   On: 07/25/2016 16:48   Dg Knee Complete 4 Views Right  Result Date: 07/25/2016 CLINICAL DATA:  Pain following fall EXAM: RIGHT KNEE - COMPLETE 4+ VIEW COMPARISON:  None. FINDINGS: Frontal, lateral, and bilateral oblique views were obtained. There is no demonstrable fracture or dislocation. No appreciable joint effusion. There is moderate generalized  osteoarthritic change. There is spurring in all compartments as well as mild generalized joint space narrowing. There is chondrocalcinosis. No erosive change. IMPRESSION: Moderate generalized osteoarthritic change. For calcinosis is present which may be seen with osteoarthritis or with calcium pyrophosphate deposition disease. No demonstrable fracture or joint effusion. Electronically Signed   By: Gwyndolyn Saxon  Jasmine December III M.D.   On: 07/25/2016 16:30     Assessment and plan- Patient is a 66 y.o. female with h/o Stage II follicular lymphoma involving intra abdominal LN mainly inguinal LN  Counts ok to proceed with cycle 2 of weekly rituxan. She will get cycle 3 next week and I will see her for cycle 4 of rituxan. We will repeat CT abdomen after cycle 4 and refer her to rad Onc if she has persistent adenopathy given LLE swelling    Visit Diagnosis 1. Follicular lymphoma of intra-abdominal lymph nodes, unspecified follicular lymphoma type ([place])   2. Visit for monitoring Rituxan therapy      Dr. Randa Evens, MD, MPH [facility] Pager- [phone] 08/22/2016 9:54 AM

## 2016-08-22 NOTE — Progress Notes (Signed)
Here for follow up. 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 Halting slow answers.

## 2016-08-22 NOTE — Addendum Note (Signed)
Addended by: Luella Cook on: 08/22/2016 10:09 AM   Modules accepted: Orders

## 2016-08-23 ENCOUNTER — Emergency Department
Admission: EM | Admit: 2016-08-23 | Discharge: 2016-08-23 | Disposition: A | Discharge status: Home / Self Care | Payer: Medicare Other | Attending: Emergency Medicine | Admitting: Emergency Medicine

## 2016-08-23 ENCOUNTER — Emergency Department: Payer: Medicare Other

## 2016-08-23 ENCOUNTER — Ambulatory Visit (INDEPENDENT_AMBULATORY_CARE_PROVIDER_SITE_OTHER): Payer: Medicare Other | Admitting: Family Medicine

## 2016-08-23 ENCOUNTER — Encounter: Payer: Self-pay | Admitting: Family Medicine

## 2016-08-23 ENCOUNTER — Encounter: Payer: Self-pay | Admitting: Emergency Medicine

## 2016-08-23 VITALS — BP 138/76 | HR 81 | Temp 97.8°F | Resp 16

## 2016-08-23 DIAGNOSIS — I2729 Other secondary pulmonary hypertension: Secondary | ICD-10-CM | POA: Diagnosis not present

## 2016-08-23 DIAGNOSIS — J449 Chronic obstructive pulmonary disease, unspecified: Secondary | ICD-10-CM | POA: Diagnosis not present

## 2016-08-23 DIAGNOSIS — L03116 Cellulitis of left lower limb: Secondary | ICD-10-CM | POA: Diagnosis not present

## 2016-08-23 DIAGNOSIS — I950 Idiopathic hypotension: Secondary | ICD-10-CM | POA: Diagnosis not present

## 2016-08-23 DIAGNOSIS — M7989 Other specified soft tissue disorders: Secondary | ICD-10-CM | POA: Diagnosis not present

## 2016-08-23 DIAGNOSIS — G43909 Migraine, unspecified, not intractable, without status migrainosus: Secondary | ICD-10-CM | POA: Diagnosis not present

## 2016-08-23 DIAGNOSIS — F1290 Cannabis use, unspecified, uncomplicated: Secondary | ICD-10-CM | POA: Insufficient documentation

## 2016-08-23 DIAGNOSIS — I129 Hypertensive chronic kidney disease with stage 1 through stage 4 chronic kidney disease, or unspecified chronic kidney disease: Secondary | ICD-10-CM | POA: Insufficient documentation

## 2016-08-23 DIAGNOSIS — G35 Multiple sclerosis: Secondary | ICD-10-CM | POA: Diagnosis not present

## 2016-08-23 DIAGNOSIS — I10 Essential (primary) hypertension: Secondary | ICD-10-CM | POA: Diagnosis not present

## 2016-08-23 DIAGNOSIS — F3341 Major depressive disorder, recurrent, in partial remission: Secondary | ICD-10-CM

## 2016-08-23 DIAGNOSIS — N3001 Acute cystitis with hematuria: Secondary | ICD-10-CM | POA: Diagnosis not present

## 2016-08-23 DIAGNOSIS — Z79899 Other long term (current) drug therapy: Secondary | ICD-10-CM | POA: Diagnosis not present

## 2016-08-23 DIAGNOSIS — Z87891 Personal history of nicotine dependence: Secondary | ICD-10-CM | POA: Insufficient documentation

## 2016-08-23 DIAGNOSIS — F909 Attention-deficit hyperactivity disorder, unspecified type: Secondary | ICD-10-CM | POA: Diagnosis not present

## 2016-08-23 DIAGNOSIS — N183 Chronic kidney disease, stage 3 (moderate): Secondary | ICD-10-CM | POA: Insufficient documentation

## 2016-08-23 DIAGNOSIS — G629 Polyneuropathy, unspecified: Secondary | ICD-10-CM | POA: Diagnosis not present

## 2016-08-23 DIAGNOSIS — M1991 Primary osteoarthritis, unspecified site: Secondary | ICD-10-CM | POA: Diagnosis not present

## 2016-08-23 DIAGNOSIS — G4733 Obstructive sleep apnea (adult) (pediatric): Secondary | ICD-10-CM | POA: Diagnosis not present

## 2016-08-23 DIAGNOSIS — I739 Peripheral vascular disease, unspecified: Secondary | ICD-10-CM | POA: Diagnosis not present

## 2016-08-23 DIAGNOSIS — M5416 Radiculopathy, lumbar region: Secondary | ICD-10-CM | POA: Diagnosis not present

## 2016-08-23 DIAGNOSIS — G501 Atypical facial pain: Secondary | ICD-10-CM | POA: Diagnosis not present

## 2016-08-23 LAB — BASIC METABOLIC PANEL
Anion gap: 9 (ref 5–15)
BUN: 21 mg/dL — ABNORMAL HIGH (ref 6–20)
CO2: 27 mmol/L (ref 22–32)
Calcium: 9.3 mg/dL (ref 8.9–10.3)
Chloride: 104 mmol/L (ref 101–111)
Creatinine, Ser: 1.4 mg/dL — ABNORMAL HIGH (ref 0.44–1.00)
GFR calc Af Amer: 45 mL/min — ABNORMAL LOW (ref >60)
GFR calc non Af Amer: 38 mL/min — ABNORMAL LOW (ref >60)
Glucose, Bld: 110 mg/dL — ABNORMAL HIGH (ref 65–99)
Potassium: 3.5 mmol/L (ref 3.5–5.1)
Sodium: 140 mmol/L (ref 135–145)

## 2016-08-23 LAB — CBC
HCT: 40.3 % (ref 35.0–47.0)
Hemoglobin: 13.7 g/dL (ref 12.0–16.0)
MCH: 28.7 pg (ref 26.0–34.0)
MCHC: 34 g/dL (ref 32.0–36.0)
MCV: 84.2 fL (ref 80.0–100.0)
Platelets: 278 10*3/uL (ref 150–440)
RBC: 4.78 MIL/uL (ref 3.80–5.20)
RDW: 15.1 % — ABNORMAL HIGH (ref 11.5–14.5)
WBC: 8.6 10*3/uL (ref 3.6–11.0)

## 2016-08-23 LAB — URINALYSIS, COMPLETE (UACMP) WITH MICROSCOPIC
Bilirubin Urine: NEGATIVE
Glucose, UA: NEGATIVE mg/dL
Hgb urine dipstick: NEGATIVE
Ketones, ur: NEGATIVE mg/dL
Nitrite: POSITIVE — AB
Protein, ur: 100 mg/dL — AB
Specific Gravity, Urine: 1.014 (ref 1.005–1.030)
pH: 9 — ABNORMAL HIGH (ref 5.0–8.0)

## 2016-08-23 MED ORDER — SULFAMETHOXAZOLE-TRIMETHOPRIM 800-160 MG PO TABS: 1.0000 | ORAL_TABLET | Freq: Two times a day (BID) | ORAL | 0 refills | Status: DC

## 2016-08-23 MED ORDER — CEPHALEXIN 500 MG PO CAPS: 500.0000 mg | ORAL_CAPSULE | Freq: Three times a day (TID) | ORAL | 0 refills | Status: DC

## 2016-08-23 MED ORDER — CEFTRIAXONE SODIUM 1 G IJ SOLR: 1.0000 g | Freq: Once | INTRAMUSCULAR | Status: DC

## 2016-08-23 MED ORDER — CEFTRIAXONE SODIUM-DEXTROSE 1-3.74 GM-% IV SOLR
1.0000 g | Freq: Once | INTRAVENOUS | Status: AC
  Administered 2016-08-23: 1 g via INTRAVENOUS
  Filled 2016-08-23: qty 50

## 2016-08-23 NOTE — Patient Instructions (Addendum)
Please do go right over to the ER now to get evaluated for a possible blood clot or infection in your left leg I urge you to stay at a nursing home or assisted living facility where you can receive additional care for your health problems Talk with your social worker about options available to you, because we all want the best for you

## 2016-08-23 NOTE — Assessment & Plan Note (Signed)
Encouraged patient to continue to f/u with her psychiatrist; psych gave her number to APS; she was also urged to talk with her social worker about her home situation

## 2016-08-23 NOTE — ED Triage Notes (Signed)
Pt sent by doctor to rule out cellulitis or DVT. Pt states L leg is red, swollen, and painful. Pt has stockings on. Pt leg appears to be red, swollen, warm to touch. Pt alert and oriented. In wheelchair, has foley in place.

## 2016-08-23 NOTE — Progress Notes (Signed)
BP 138/76   Pulse 81   Temp 97.8 F (36.6 C) (Oral)   Resp 16   SpO2 96%    Subjective:    Patient ID: [name], female    DOB: [birth year], 66 y.o.   MRN: [id number]  HPI: [name] is a 66 y.o. female  Chief Complaint  Patient presents with  . Follow-up    3 week, still at home  . Recurrent Skin Infections    left leg, red, painful and hot to touch   HPI  Patient is still at home; she is not in a nursing home She has lymphoma; seeing oncologist; thinks the swelling in the left side of the groin is getting smaller  She has had 4-5 days of increasing left leg edema and erythema She has a recliner in the living room and it's surrounded by stuff and she can't get to it The leg is warm and "feels like it's about to explode" No fevers She has compression stockings, but it is cutting off circulation at the knee No SHOB; no hx of DVT No chest pain Not going to the wound clinic anymore She ran out of some of her medicines; back on seroquel now; seeing a psychiatrist, just saw him Monday; "I told him all my troubles" she says; they gave her the number to call adult protective services "I'm so miserable"; her husband curses and fusses and says it's her fault they were late today  Depression screen [facility] 2/9 08/02/2016 06/18/2016 06/07/2016 12/22/2015 11/08/2015  Decreased Interest 0 0 0 3 1  Down, Depressed, Hopeless 1 0 1 3 3   PHQ - 2 Score 1 0 1 6 4   Altered sleeping - - - 0 1  Tired, decreased energy - - - 3 3  Change in appetite - - - 0 3  Feeling bad or failure about yourself  - - - 3 2  Trouble concentrating - - - 3 3  Moving slowly or fidgety/restless - - - 0 0  Suicidal thoughts - - - 0 0  PHQ-9 Score - - - 15 16  Difficult doing work/chores - - - Very difficult Somewhat difficult   Relevant past medical, surgical, family and social history reviewed Past Medical History:  Diagnosis Date  . ADHD   . Anxiety   . COPD (chronic obstructive pulmonary disease) ([place])     . Depression    major depressive  . Dyspnea    doe  . Edema    left leg  . Follicular lymphoma ([place])    B Cell  . Hypertension   . Hypotension    idiopathic  . Kyphoscoliosis and scoliosis 11/26/2011  . Morbid obesity ([place]) 01/05/2016  . Multiple sclerosis ([place])   . Multiple sclerosis ([place])    1980's  . Neuromuscular disorder ([place])   . Obstructive and reflux uropathy    foley  . Pain    atypical facial  . Peripheral vascular disease of lower extremity with ulceration ([place]) 11/08/2015  . Skin ulcer ([place]) 11/08/2015  . Weakness    generalized. has MS   Past Surgical History:  Procedure Laterality Date  . BACK SURGERY N/A 2002  . CYST EXCISION     lower back  . INGUINAL LYMPH NODE BIOPSY Left 07/04/2016   Procedure: INGUINAL LYMPH NODE BIOPSY;  Surgeon: Christene Lye, MD;  Location: [facility] ORS;  Service: General;  Laterality: Left;  . PORTACATH PLACEMENT N/A 07/22/2016   Procedure: INSERTION PORT-A-CATH;  Surgeon: Christene Lye, MD;  Location: [facility] ORS;  Service: General;  Laterality: N/A;  . TONSILLECTOMY AND ADENOIDECTOMY    . TUBAL LIGATION     Family History  Problem Relation Age of Onset  . COPD Mother   . Diabetes Mother   . Heart failure Mother   . Alcohol abuse Father   . Kidney disease Father   . Kidney failure Father   . Arthritis Sister   . CAD Maternal Grandmother   . Stroke Maternal Grandfather   . Diabetes Sister   . Arthritis Sister   . Arthritis Brother    Social History  Substance Use Topics  . Smoking status: Former Smoker    Packs/day: 1.00    Years: 20.00    Types: Cigarettes    Quit date: 02/03/2016  . Smokeless tobacco: Never Used  . Alcohol use No   Interim medical history since last visit reviewed. Allergies and medications reviewed  Review of Systems Per HPI unless specifically indicated above     Objective:    BP 138/76   Pulse 81   Temp 97.8 F (36.6 C) (Oral)   Resp 16   SpO2 96%     Physical Exam   Constitutional: She appears well-developed and well-nourished. No distress.  Obese, weight not measured today  HENT:  Scab on the scalp  Cardiovascular: Normal rate and regular rhythm.   Pulmonary/Chest: Effort normal and breath sounds normal.  Abdominal: She exhibits no distension.  Musculoskeletal: She exhibits edema.       Left lower leg: She exhibits swelling and edema.  Erythema and increased warmth of the left leg, tight with peau d'orange changes; left leg is visibly larger than the right leg; no weeping or oozing but there is a scab laterally  Neurological:  Seated in wheelchair, gait not assessed  Skin:  Skin of the lower LEFT leg is erythematous, tight, with peau d'orange changes laterally; no oozing  Psychiatric: She exhibits a depressed mood.  crying   Results for orders placed or performed in visit on 08/22/16  Comprehensive metabolic panel  Result Value Ref Range   Sodium 137 135 - 145 mmol/L   Potassium 3.0 (L) 3.5 - 5.1 mmol/L   Chloride 104 101 - 111 mmol/L   CO2 25 22 - 32 mmol/L   Glucose, Bld 127 (H) 65 - 99 mg/dL   BUN 23 (H) 6 - 20 mg/dL   Creatinine, Ser 1.43 (H) 0.44 - 1.00 mg/dL   Calcium 9.0 8.9 - 10.3 mg/dL   Total Protein 7.6 6.5 - 8.1 g/dL   Albumin 4.1 3.5 - 5.0 g/dL   AST 27 15 - 41 U/L   ALT 18 14 - 54 U/L   Alkaline Phosphatase 74 38 - 126 U/L   Total Bilirubin 0.6 0.3 - 1.2 mg/dL   GFR calc non Af Amer 38 (L) >60 mL/min   GFR calc Af Amer 43 (L) >60 mL/min   Anion gap 8 5 - 15  CBC with Differential  Result Value Ref Range   WBC 7.0 3.6 - 11.0 K/uL   RBC 4.34 3.80 - 5.20 MIL/uL   Hemoglobin 12.3 12.0 - 16.0 g/dL   HCT 35.8 35.0 - 47.0 %   MCV 82.4 80.0 - 100.0 fL   MCH 28.3 26.0 - 34.0 pg   MCHC 34.4 32.0 - 36.0 g/dL   RDW 15.1 (H) 11.5 - 14.5 %   Platelets 234 150 - 440 K/uL   Neutrophils Relative %  66 %   Neutro Abs 4.6 1.4 - 6.5 K/uL   Lymphocytes Relative 17 %   Lymphs Abs 1.2 1.0 - 3.6 K/uL   Monocytes Relative 9 %    Monocytes Absolute 0.6 0.2 - 0.9 K/uL   Eosinophils Relative 7 %   Eosinophils Absolute 0.5 0 - 0.7 K/uL   Basophils Relative 1 %   Basophils Absolute 0.1 0 - 0.1 K/uL      Assessment & Plan:   Problem List Items Addressed This Visit      Other   Major depressive disorder, recurrent, in partial remission ([place])    Encouraged patient to continue to f/u with her psychiatrist; psych gave her number to APS; she was also urged to talk with her social worker about her home situation       Other Visit Diagnoses    Left leg swelling    -  Primary   with warmth, increase in diameter; concerning for new DVT; sending over to ER for evaluation       Follow up plan: No Follow-up on file.  An after-visit summary was printed and given to the patient at [place].  Please see the patient instructions which may contain other information and recommendations beyond what is mentioned above in the assessment and plan.  No orders of the defined types were placed in this encounter.   No orders of the defined types were placed in this encounter.

## 2016-08-23 NOTE — ED Provider Notes (Signed)
[facility] Emergency Department Provider Note  ____________________________________________  Time seen: Approximately 6:56 PM  I have reviewed the triage vital signs and the nursing notes.   HISTORY  Chief Complaint Leg Swelling   HPI [name] is a 66 y.o. female with a history of obesity, multiple sclerosis, peripheral vascular disease and presents for evaluation of left leg pain and swelling. Patient reports 5 days of redness, warmth, and swelling of her left leg. She reports 5 out of 10 pain that is throbbing, constant, nonradiating for the last 5 days. No fever, no chills, no nausea, no vomiting, normal appetite. No prior h/o DVT/PE. No CP. Went to urgent care today and was sent here for evaluation.  Past Medical History:  Diagnosis Date  . ADHD   . Anxiety   . COPD (chronic obstructive pulmonary disease) ([place])   . Depression    major depressive  . Dyspnea    doe  . Edema    left leg  . Follicular lymphoma ([place])    B Cell  . Hypertension   . Hypotension    idiopathic  . Kyphoscoliosis and scoliosis 11/26/2011  . Morbid obesity ([place]) 01/05/2016  . Multiple sclerosis ([place])   . Multiple sclerosis ([place])    1980's  . Neuromuscular disorder ([place])   . Obstructive and reflux uropathy    foley  . Pain    atypical facial  . Peripheral vascular disease of lower extremity with ulceration ([place]) 11/08/2015  . Skin ulcer ([place]) 11/08/2015  . Weakness    generalized. has MS    Patient Active Problem List   Diagnosis Date Noted  . Follicular lymphoma of intra-abdominal lymph nodes ([place]) 08/06/2016  . Multiple falls 06/07/2016  . Inguinal adenopathy 05/29/2016  . Breast cancer screening 01/05/2016  . Morbid obesity ([place]) 01/05/2016  . Pressure ulcer 12/19/2015  . Low HDL (under 40) 12/19/2015  . Cellulitis 12/18/2015  . Skin ulcer ([place]) 11/08/2015  . Peripheral vascular disease of lower extremity with ulceration ([place]) 11/08/2015  . CKD  (chronic kidney disease) stage 3, GFR 30-59 ml/min 11/08/2015  . Decubitus ulcer of left thigh, stage 2 04/18/2015  . Constipation due to pain medication 01/31/2015  . Obstructive sleep apnea of adult 01/13/2015  . Pelvic muscle wasting 01/13/2015  . Incomplete bladder emptying 01/13/2015  . Headache, migraine 10/24/2014  . 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 Current tobacco use 10/24/2014  . Lumbar radiculopathy, chronic 10/02/2013  . COPD with bronchial hyperresponsiveness ([place]) 10/02/2013  . Major depressive disorder, recurrent, in partial remission ([place]) 10/02/2013  . Hypertension goal BP (blood pressure) < 140/90 10/02/2013  . Multiple sclerosis ([place]) 10/02/2013  . Absence of bladder continence 09/25/2012  . Acontractile bladder 02/12/2012  . Bladder retention 02/12/2012  . Narrowing of intervertebral disc space 11/26/2011  . Kyphoscoliosis and scoliosis 11/26/2011    Past Surgical History:  Procedure Laterality Date  . BACK SURGERY N/A 2002  . CYST EXCISION     lower back  . INGUINAL LYMPH NODE BIOPSY Left 07/04/2016   Procedure: INGUINAL LYMPH NODE BIOPSY;  Surgeon: Christene Lye, MD;  Location: [facility] ORS;  Service: General;  Laterality: Left;  . PORTACATH PLACEMENT N/A 07/22/2016   Procedure: INSERTION PORT-A-CATH;  Surgeon: Christene Lye, MD;  Location: [facility] ORS;  Service: General;  Laterality: N/A;  . TONSILLECTOMY AND ADENOIDECTOMY    . TUBAL LIGATION      Prior to Admission medications   Medication Sig Start Date End Date  Taking? Authorizing Provider  AVONEX PEN 30 MCG/0.5ML AJKT Inject 30 mcg into the skin every Monday. 04/28/16   Historical Provider, MD  buPROPion (WELLBUTRIN XL) 300 MG 24 hr tablet Take 300 mg by mouth every morning.  05/28/16   Historical Provider, MD  cephALEXin (KEFLEX) 500 MG capsule Take 1 capsule (500 mg total) by mouth 3 (three) times daily. 08/23/16 08/30/16  Rudene Re, MD  clonazePAM (KLONOPIN) 1 MG tablet Take 1 tablet by  mouth at bedtime.  07/12/13   Historical Provider, MD  DULoxetine (CYMBALTA) 60 MG capsule Take 60 mg by mouth every morning.  07/26/13   Historical Provider, MD  fluticasone furoate-vilanterol (BREO ELLIPTA) 100-25 MCG/INH AEPB Inhale 1 puff into the lungs daily. 01/05/16   Arnetha Courser, MD  gabapentin (NEURONTIN) 600 MG tablet TAKE 1 TABLET (600 MG TOTAL) BY MOUTH 3 (THREE) TIMES DAILY. 08/07/16   Arnetha Courser, MD  hydrochlorothiazide (MICROZIDE) 12.5 MG capsule TAKE ONE CAPSULE (12.5 MG TOTAL) BY MOUTH EVERY DAY (THIS REPLACES TRIAMTERENE/HCTZ) 08/07/16   Arnetha Courser, MD  lidocaine-prilocaine (EMLA) cream Apply 1 application topically as needed. apply cream over the port site and then place small piece of saran wrap over the cream to protect your clothing 08/14/16   Sindy Guadeloupe, MD  methylphenidate (RITALIN) 10 MG tablet Take 10 mg by mouth 2 (two) times daily.  01/23/15   Historical Provider, MD  Multiple Vitamin (MULTIVITAMIN WITH MINERALS) TABS tablet Take 1 tablet by mouth daily.    Historical Provider, MD  polyethylene glycol powder (GLYCOLAX/MIRALAX) powder Take 17 g by mouth daily. Mixed in water Patient taking differently: Take 17 g by mouth as needed. Mixed in water 03/11/16   Arnetha Courser, MD  potassium chloride (K-DUR) 10 MEQ tablet Take 1 tablet (10 mEq total) by mouth daily. 12/22/15   Arnetha Courser, MD  QUEtiapine Fumarate (SEROQUEL XR) 150 MG 24 hr tablet  08/14/16   Historical Provider, MD  solifenacin (VESICARE) 5 MG tablet Take 5 mg by mouth daily.    Historical Provider, MD  sulfamethoxazole-trimethoprim (BACTRIM DS,SEPTRA DS) 800-160 MG tablet Take 1 tablet by mouth 2 (two) times daily. 08/23/16 08/30/16  Rudene Re, MD  tiotropium (SPIRIVA) 18 MCG inhalation capsule Place 18 mcg into inhaler and inhale daily.    Historical Provider, MD  tiZANidine (ZANAFLEX) 4 MG tablet Take 4 mg by mouth 4 (four) times daily. TAKES FOR MS    Historical Provider, MD  topiramate (TOPAMAX)  50 MG tablet TAKE 1 TABLET (50 MG TOTAL) BY MOUTH 2 (TWO) TIMES DAILY. 08/07/16   Arnetha Courser, MD    Allergies Patient has no known allergies.  Family History  Problem Relation Age of Onset  . COPD Mother   . Diabetes Mother   . Heart failure Mother   . Alcohol abuse Father   . Kidney disease Father   . Kidney failure Father   . Arthritis Sister   . CAD Maternal Grandmother   . Stroke Maternal Grandfather   . Diabetes Sister   . Arthritis Sister   . Arthritis Brother     Social History Social History  Substance Use Topics  . Smoking status: Former Smoker    Packs/day: 1.00    Years: 20.00    Types: Cigarettes    Quit date: 02/03/2016  . Smokeless tobacco: Never Used  . Alcohol use No    Review of Systems  Constitutional: Negative for fever. Eyes: Negative for visual changes.  ENT: Negative for sore throat. Neck: No neck pain  Cardiovascular: Negative for chest pain. Respiratory: Negative for shortness of breath. Gastrointestinal: Negative for abdominal pain, vomiting or diarrhea. Genitourinary: Negative for dysuria. Musculoskeletal: Negative for back pain. + LLE pain, redness and swelling Skin: Negative for rash. Neurological: Negative for headaches, weakness or numbness. Psych: No SI or HI  ____________________________________________   PHYSICAL EXAM:  VITAL SIGNS: ED Triage Vitals  Enc Vitals Group     BP 08/23/16 1623 (!) 159/99     Pulse Rate 08/23/16 1623 81     Resp 08/23/16 1623 20     Temp 08/23/16 1623 98.7 F (37.1 C)     Temp Source 08/23/16 1623 Oral     SpO2 08/23/16 1623 99 %     Weight 08/23/16 1624 269 lb (122 kg)     Height 08/23/16 1624 5\' 3"  (1.6 m)     Head Circumference --      Peak Flow --      Pain Score 08/23/16 1633 8     Pain Loc --      Pain Edu? --      Excl. in [place]? --     Constitutional: Alert and oriented. Well appearing and in no apparent distress. HEENT:      Head: Normocephalic and atraumatic.         Eyes:  Conjunctivae are normal. Sclera is non-icteric. EOMI. PERRL      Mouth/Throat: Mucous membranes are moist.       Neck: Supple with no signs of meningismus. Cardiovascular: Regular rate and rhythm. No murmurs, gallops, or rubs. 2+ symmetrical distal pulses are present in all extremities. No JVD. Respiratory: Normal respiratory effort. Lungs are clear to auscultation bilaterally. No wheezes, crackles, or rhonchi.  Gastrointestinal: Soft, non tender, and non distended with positive bowel sounds. No rebound or guarding. Indwelling Foley catheter in place with cloudy urine and sediments in the tubing. Musculoskeletal: Erythema, warmth, and swelling of left lower extremity with good distal pulses, no open wounds Neurologic: Normal speech and language. Face is symmetric. Moving all extremities. No gross focal neurologic deficits are appreciated. Skin: Skin is warm, dry and intact. No rash noted. Psychiatric: Mood and affect are normal. Speech and behavior are normal.  ____________________________________________   LABS (all labs ordered are listed, but only abnormal results are displayed)  Labs Reviewed  URINALYSIS, COMPLETE (UACMP) WITH MICROSCOPIC - Abnormal; Notable for the following:       Result Value   Color, Urine AMBER (*)    APPearance CLOUDY (*)    pH 9.0 (*)    Protein, ur 100 (*)    Nitrite POSITIVE (*)    Leukocytes, UA LARGE (*)    Bacteria, UA RARE (*)    Squamous Epithelial / LPF 0-5 (*)    All other components within normal limits  CBC - Abnormal; Notable for the following:    RDW 15.1 (*)    All other components within normal limits  BASIC METABOLIC PANEL - Abnormal; Notable for the following:    Glucose, Bld 110 (*)    BUN 21 (*)    Creatinine, Ser 1.40 (*)    GFR calc non Af Amer 38 (*)    GFR calc Af Amer 45 (*)    All other components within normal limits  URINE CULTURE    ____________________________________________  EKG  none ____________________________________________  RADIOLOGY  US doppler: negative for DVT  ____________________________________________   PROCEDURES  Procedure(s) performed: None  Procedures Critical Care performed:  None ____________________________________________   INITIAL IMPRESSION / ASSESSMENT AND PLAN / ED COURSE  66 y.o. female with a history of obesity, multiple sclerosis, peripheral vascular disease and presents for evaluation of left leg pain and swelling x 5 days. Patient is well-appearing, in no distress, normal vital signs, normal white count and no evidence of sepsis. Doppler study is negative for DVT. Patient's presentation concerning with cellulitis. Patient was given a dose of IV Rocephin. Patient also has a UA concerning for urinary tract infection and a cloudy urine and Foley tubing with lots of sediment. Foley exchanged in the ED. Patient given rocephin for both cellulitis and UTI.  will be sent home on Bactrim and Keflex. Area demarcated for close monitoring. Recommended close f/u with PCP on Monday or return to the ER if area is expanding.      Pertinent labs & imaging results that were available during my care of the patient were reviewed by me and considered in my medical decision making (see chart for details).    ____________________________________________   FINAL CLINICAL IMPRESSION(S) / ED DIAGNOSES  Final diagnoses:  Cellulitis of left lower extremity  Acute cystitis with hematuria      NEW MEDICATIONS STARTED DURING THIS VISIT:  New Prescriptions   CEPHALEXIN (KEFLEX) 500 MG CAPSULE    Take 1 capsule (500 mg total) by mouth 3 (three) times daily.   SULFAMETHOXAZOLE-TRIMETHOPRIM (BACTRIM DS,SEPTRA DS) 800-160 MG TABLET    Take 1 tablet by mouth 2 (two) times daily.     Note:  This document was prepared using Dragon voice recognition software and may include unintentional dictation  errors.    Rudene Re, MD 08/23/16 2024

## 2016-08-26 DIAGNOSIS — G629 Polyneuropathy, unspecified: Secondary | ICD-10-CM | POA: Diagnosis not present

## 2016-08-26 DIAGNOSIS — J449 Chronic obstructive pulmonary disease, unspecified: Secondary | ICD-10-CM | POA: Diagnosis not present

## 2016-08-26 DIAGNOSIS — M5416 Radiculopathy, lumbar region: Secondary | ICD-10-CM | POA: Diagnosis not present

## 2016-08-26 DIAGNOSIS — G35 Multiple sclerosis: Secondary | ICD-10-CM | POA: Diagnosis not present

## 2016-08-26 DIAGNOSIS — M1991 Primary osteoarthritis, unspecified site: Secondary | ICD-10-CM | POA: Diagnosis not present

## 2016-08-26 DIAGNOSIS — G43909 Migraine, unspecified, not intractable, without status migrainosus: Secondary | ICD-10-CM | POA: Diagnosis not present

## 2016-08-26 DIAGNOSIS — I739 Peripheral vascular disease, unspecified: Secondary | ICD-10-CM | POA: Diagnosis not present

## 2016-08-26 DIAGNOSIS — G501 Atypical facial pain: Secondary | ICD-10-CM | POA: Diagnosis not present

## 2016-08-26 DIAGNOSIS — I950 Idiopathic hypotension: Secondary | ICD-10-CM | POA: Diagnosis not present

## 2016-08-26 DIAGNOSIS — I10 Essential (primary) hypertension: Secondary | ICD-10-CM | POA: Diagnosis not present

## 2016-08-26 DIAGNOSIS — G4733 Obstructive sleep apnea (adult) (pediatric): Secondary | ICD-10-CM | POA: Diagnosis not present

## 2016-08-26 DIAGNOSIS — I2729 Other secondary pulmonary hypertension: Secondary | ICD-10-CM | POA: Diagnosis not present

## 2016-08-26 LAB — URINE CULTURE: Culture: >=100000 — AB

## 2016-08-27 DIAGNOSIS — I2729 Other secondary pulmonary hypertension: Secondary | ICD-10-CM | POA: Diagnosis not present

## 2016-08-27 DIAGNOSIS — M1991 Primary osteoarthritis, unspecified site: Secondary | ICD-10-CM | POA: Diagnosis not present

## 2016-08-27 DIAGNOSIS — J449 Chronic obstructive pulmonary disease, unspecified: Secondary | ICD-10-CM | POA: Diagnosis not present

## 2016-08-27 DIAGNOSIS — G501 Atypical facial pain: Secondary | ICD-10-CM | POA: Diagnosis not present

## 2016-08-27 DIAGNOSIS — I739 Peripheral vascular disease, unspecified: Secondary | ICD-10-CM | POA: Diagnosis not present

## 2016-08-27 DIAGNOSIS — G629 Polyneuropathy, unspecified: Secondary | ICD-10-CM | POA: Diagnosis not present

## 2016-08-27 DIAGNOSIS — I950 Idiopathic hypotension: Secondary | ICD-10-CM | POA: Diagnosis not present

## 2016-08-27 DIAGNOSIS — G43909 Migraine, unspecified, not intractable, without status migrainosus: Secondary | ICD-10-CM | POA: Diagnosis not present

## 2016-08-27 DIAGNOSIS — I10 Essential (primary) hypertension: Secondary | ICD-10-CM | POA: Diagnosis not present

## 2016-08-27 DIAGNOSIS — M5416 Radiculopathy, lumbar region: Secondary | ICD-10-CM | POA: Diagnosis not present

## 2016-08-27 DIAGNOSIS — G4733 Obstructive sleep apnea (adult) (pediatric): Secondary | ICD-10-CM | POA: Diagnosis not present

## 2016-08-27 DIAGNOSIS — G35 Multiple sclerosis: Secondary | ICD-10-CM | POA: Diagnosis not present

## 2016-08-28 ENCOUNTER — Inpatient Hospital Stay
Admission: EM | Admit: 2016-08-28 | Discharge: 2016-08-30 | DRG: 603 | Disposition: A | Discharge status: Skilled Nursing Facility | Payer: Medicare Other | Attending: Internal Medicine | Admitting: Internal Medicine

## 2016-08-28 ENCOUNTER — Emergency Department: Payer: Medicare Other

## 2016-08-28 ENCOUNTER — Encounter: Payer: Self-pay | Admitting: Vascular Surgery

## 2016-08-28 DIAGNOSIS — Z87891 Personal history of nicotine dependence: Secondary | ICD-10-CM | POA: Diagnosis not present

## 2016-08-28 DIAGNOSIS — F909 Attention-deficit hyperactivity disorder, unspecified type: Secondary | ICD-10-CM | POA: Diagnosis present

## 2016-08-28 DIAGNOSIS — L89899 Pressure ulcer of other site, unspecified stage: Secondary | ICD-10-CM | POA: Diagnosis not present

## 2016-08-28 DIAGNOSIS — G35 Multiple sclerosis: Secondary | ICD-10-CM | POA: Diagnosis present

## 2016-08-28 DIAGNOSIS — I129 Hypertensive chronic kidney disease with stage 1 through stage 4 chronic kidney disease, or unspecified chronic kidney disease: Secondary | ICD-10-CM | POA: Diagnosis not present

## 2016-08-28 DIAGNOSIS — N183 Chronic kidney disease, stage 3 (moderate): Secondary | ICD-10-CM | POA: Diagnosis present

## 2016-08-28 DIAGNOSIS — G8911 Acute pain due to trauma: Secondary | ICD-10-CM | POA: Diagnosis not present

## 2016-08-28 DIAGNOSIS — Z7951 Long term (current) use of inhaled steroids: Secondary | ICD-10-CM | POA: Diagnosis not present

## 2016-08-28 DIAGNOSIS — R296 Repeated falls: Secondary | ICD-10-CM | POA: Diagnosis not present

## 2016-08-28 DIAGNOSIS — R600 Localized edema: Secondary | ICD-10-CM | POA: Diagnosis not present

## 2016-08-28 DIAGNOSIS — Z833 Family history of diabetes mellitus: Secondary | ICD-10-CM | POA: Diagnosis not present

## 2016-08-28 DIAGNOSIS — E876 Hypokalemia: Secondary | ICD-10-CM | POA: Diagnosis present

## 2016-08-28 DIAGNOSIS — Z8572 Personal history of non-Hodgkin lymphomas: Secondary | ICD-10-CM

## 2016-08-28 DIAGNOSIS — L03115 Cellulitis of right lower limb: Secondary | ICD-10-CM | POA: Diagnosis not present

## 2016-08-28 DIAGNOSIS — M6281 Muscle weakness (generalized): Secondary | ICD-10-CM | POA: Diagnosis not present

## 2016-08-28 DIAGNOSIS — I10 Essential (primary) hypertension: Secondary | ICD-10-CM | POA: Diagnosis not present

## 2016-08-28 DIAGNOSIS — N179 Acute kidney failure, unspecified: Secondary | ICD-10-CM | POA: Diagnosis present

## 2016-08-28 DIAGNOSIS — I2729 Other secondary pulmonary hypertension: Secondary | ICD-10-CM | POA: Diagnosis not present

## 2016-08-28 DIAGNOSIS — F419 Anxiety disorder, unspecified: Secondary | ICD-10-CM | POA: Diagnosis present

## 2016-08-28 DIAGNOSIS — Z841 Family history of disorders of kidney and ureter: Secondary | ICD-10-CM | POA: Diagnosis not present

## 2016-08-28 DIAGNOSIS — Z79899 Other long term (current) drug therapy: Secondary | ICD-10-CM

## 2016-08-28 DIAGNOSIS — Z8249 Family history of ischemic heart disease and other diseases of the circulatory system: Secondary | ICD-10-CM | POA: Diagnosis not present

## 2016-08-28 DIAGNOSIS — Z825 Family history of asthma and other chronic lower respiratory diseases: Secondary | ICD-10-CM | POA: Diagnosis not present

## 2016-08-28 DIAGNOSIS — J449 Chronic obstructive pulmonary disease, unspecified: Secondary | ICD-10-CM | POA: Diagnosis not present

## 2016-08-28 DIAGNOSIS — R1312 Dysphagia, oropharyngeal phase: Secondary | ICD-10-CM | POA: Diagnosis not present

## 2016-08-28 DIAGNOSIS — L03116 Cellulitis of left lower limb: Principal | ICD-10-CM | POA: Diagnosis present

## 2016-08-28 DIAGNOSIS — K5900 Constipation, unspecified: Secondary | ICD-10-CM | POA: Diagnosis not present

## 2016-08-28 DIAGNOSIS — L0390 Cellulitis, unspecified: Secondary | ICD-10-CM | POA: Diagnosis present

## 2016-08-28 DIAGNOSIS — R2681 Unsteadiness on feet: Secondary | ICD-10-CM | POA: Diagnosis not present

## 2016-08-28 DIAGNOSIS — Z6841 Body Mass Index (BMI) 40.0 and over, adult: Secondary | ICD-10-CM | POA: Diagnosis not present

## 2016-08-28 DIAGNOSIS — F329 Major depressive disorder, single episode, unspecified: Secondary | ICD-10-CM | POA: Diagnosis present

## 2016-08-28 DIAGNOSIS — M419 Scoliosis, unspecified: Secondary | ICD-10-CM | POA: Diagnosis present

## 2016-08-28 DIAGNOSIS — B029 Zoster without complications: Secondary | ICD-10-CM | POA: Diagnosis not present

## 2016-08-28 DIAGNOSIS — L97929 Non-pressure chronic ulcer of unspecified part of left lower leg with unspecified severity: Secondary | ICD-10-CM | POA: Diagnosis not present

## 2016-08-28 DIAGNOSIS — I739 Peripheral vascular disease, unspecified: Secondary | ICD-10-CM | POA: Diagnosis present

## 2016-08-28 DIAGNOSIS — L03119 Cellulitis of unspecified part of limb: Secondary | ICD-10-CM | POA: Diagnosis not present

## 2016-08-28 DIAGNOSIS — M79605 Pain in left leg: Secondary | ICD-10-CM | POA: Diagnosis not present

## 2016-08-28 DIAGNOSIS — M7989 Other specified soft tissue disorders: Secondary | ICD-10-CM | POA: Diagnosis not present

## 2016-08-28 DIAGNOSIS — Z5189 Encounter for other specified aftercare: Secondary | ICD-10-CM | POA: Diagnosis not present

## 2016-08-28 DIAGNOSIS — N139 Obstructive and reflux uropathy, unspecified: Secondary | ICD-10-CM | POA: Diagnosis not present

## 2016-08-28 DIAGNOSIS — W19XXXA Unspecified fall, initial encounter: Secondary | ICD-10-CM | POA: Diagnosis not present

## 2016-08-28 LAB — URINALYSIS, COMPLETE (UACMP) WITH MICROSCOPIC
Bilirubin Urine: NEGATIVE
Glucose, UA: NEGATIVE mg/dL
Hgb urine dipstick: NEGATIVE
Ketones, ur: NEGATIVE mg/dL
Leukocytes, UA: NEGATIVE
Nitrite: NEGATIVE
Protein, ur: NEGATIVE mg/dL
Specific Gravity, Urine: 1.004 — ABNORMAL LOW (ref 1.005–1.030)
WBC, UA: NONE SEEN WBC/hpf (ref 0–5)
pH: 5 (ref 5.0–8.0)

## 2016-08-28 LAB — COMPREHENSIVE METABOLIC PANEL
ALT: 19 U/L (ref 14–54)
AST: 26 U/L (ref 15–41)
Albumin: 4.1 g/dL (ref 3.5–5.0)
Alkaline Phosphatase: 75 U/L (ref 38–126)
Anion gap: 8 (ref 5–15)
BUN: 27 mg/dL — ABNORMAL HIGH (ref 6–20)
CO2: 26 mmol/L (ref 22–32)
Calcium: 9.4 mg/dL (ref 8.9–10.3)
Chloride: 103 mmol/L (ref 101–111)
Creatinine, Ser: 1.83 mg/dL — ABNORMAL HIGH (ref 0.44–1.00)
GFR calc Af Amer: 32 mL/min — ABNORMAL LOW (ref >60)
GFR calc non Af Amer: 28 mL/min — ABNORMAL LOW (ref >60)
Glucose, Bld: 96 mg/dL (ref 65–99)
Potassium: 3 mmol/L — ABNORMAL LOW (ref 3.5–5.1)
Sodium: 137 mmol/L (ref 135–145)
Total Bilirubin: 0.6 mg/dL (ref 0.3–1.2)
Total Protein: 7.9 g/dL (ref 6.5–8.1)

## 2016-08-28 LAB — CBC
HCT: 36 % (ref 35.0–47.0)
Hemoglobin: 12.1 g/dL (ref 12.0–16.0)
MCH: 28.2 pg (ref 26.0–34.0)
MCHC: 33.6 g/dL (ref 32.0–36.0)
MCV: 83.8 fL (ref 80.0–100.0)
Platelets: 271 10*3/uL (ref 150–440)
RBC: 4.29 MIL/uL (ref 3.80–5.20)
RDW: 15 % — ABNORMAL HIGH (ref 11.5–14.5)
WBC: 6.2 10*3/uL (ref 3.6–11.0)

## 2016-08-28 LAB — CK: Total CK: 279 U/L — ABNORMAL HIGH (ref 38–234)

## 2016-08-28 LAB — TROPONIN I: Troponin I: <0.03 ng/mL (ref <0.03)

## 2016-08-28 MED ORDER — TIZANIDINE HCL 4 MG PO TABS
4.0000 mg | ORAL_TABLET | Freq: Four times a day (QID) | ORAL | Status: DC
  Administered 2016-08-28 – 2016-08-30 (×7): 4 mg via ORAL
  Filled 2016-08-28 (×7): qty 1

## 2016-08-28 MED ORDER — QUETIAPINE FUMARATE 300 MG PO TABS
150.0000 mg | ORAL_TABLET | Freq: Every day | ORAL | Status: DC
  Administered 2016-08-28 – 2016-08-29 (×2): 150 mg via ORAL
  Filled 2016-08-28 (×2): qty 1

## 2016-08-28 MED ORDER — SODIUM CHLORIDE 0.9 % IV SOLN
INTRAVENOUS | Status: DC
  Administered 2016-08-28 – 2016-08-30 (×3): via INTRAVENOUS

## 2016-08-28 MED ORDER — VANCOMYCIN HCL 10 G IV SOLR: 2000.0000 mg | Freq: Once | INTRAVENOUS | Status: DC

## 2016-08-28 MED ORDER — ACETAMINOPHEN 650 MG RE SUPP: 650.0000 mg | Freq: Four times a day (QID) | RECTAL | Status: DC | PRN

## 2016-08-28 MED ORDER — ENOXAPARIN SODIUM 40 MG/0.4ML ~~LOC~~ SOLN
40.0000 mg | SUBCUTANEOUS | Status: DC
  Administered 2016-08-28 – 2016-08-29 (×2): 40 mg via SUBCUTANEOUS
  Filled 2016-08-28 (×2): qty 0.4

## 2016-08-28 MED ORDER — POTASSIUM CHLORIDE CRYS ER 20 MEQ PO TBCR
40.0000 meq | EXTENDED_RELEASE_TABLET | Freq: Once | ORAL | Status: AC
  Administered 2016-08-28: 40 meq via ORAL
  Filled 2016-08-28: qty 2

## 2016-08-28 MED ORDER — VANCOMYCIN HCL 10 G IV SOLR
2000.0000 mg | Freq: Once | INTRAVENOUS | Status: AC
  Administered 2016-08-28: 2000 mg via INTRAVENOUS
  Filled 2016-08-28: qty 2000

## 2016-08-28 MED ORDER — TOPIRAMATE 25 MG PO TABS
50.0000 mg | ORAL_TABLET | Freq: Every day | ORAL | Status: DC
  Administered 2016-08-28 – 2016-08-29 (×2): 50 mg via ORAL
  Filled 2016-08-28 (×2): qty 2

## 2016-08-28 MED ORDER — ADULT MULTIVITAMIN W/MINERALS CH
1.0000 | ORAL_TABLET | Freq: Every day | ORAL | Status: DC
  Administered 2016-08-29 – 2016-08-30 (×2): 1 via ORAL
  Filled 2016-08-28 (×2): qty 1

## 2016-08-28 MED ORDER — CEFAZOLIN SODIUM-DEXTROSE 1-4 GM/50ML-% IV SOLN
1.0000 g | Freq: Three times a day (TID) | INTRAVENOUS | Status: DC
  Administered 2016-08-28 – 2016-08-30 (×6): 1 g via INTRAVENOUS
  Filled 2016-08-28 (×10): qty 50

## 2016-08-28 MED ORDER — POLYETHYLENE GLYCOL 3350 17 G PO PACK
17.0000 g | PACK | ORAL | Status: DC | PRN
  Administered 2016-08-28: 17 g via ORAL
  Filled 2016-08-28: qty 1

## 2016-08-28 MED ORDER — OXYCODONE HCL 5 MG PO TABS
5.0000 mg | ORAL_TABLET | ORAL | Status: DC | PRN
  Administered 2016-08-28 – 2016-08-30 (×2): 5 mg via ORAL
  Filled 2016-08-28 (×2): qty 1

## 2016-08-28 MED ORDER — POTASSIUM CHLORIDE CRYS ER 10 MEQ PO TBCR
10.0000 meq | EXTENDED_RELEASE_TABLET | Freq: Every day | ORAL | Status: DC
  Administered 2016-08-29 – 2016-08-30 (×2): 10 meq via ORAL
  Filled 2016-08-28 (×4): qty 1

## 2016-08-28 MED ORDER — SODIUM CHLORIDE 0.9 % IV BOLUS (SEPSIS)
1000.0000 mL | Freq: Once | INTRAVENOUS | Status: AC
  Administered 2016-08-28: 1000 mL via INTRAVENOUS

## 2016-08-28 MED ORDER — FENTANYL CITRATE (PF) 100 MCG/2ML IJ SOLN
75.0000 ug | Freq: Once | INTRAMUSCULAR | Status: AC
  Administered 2016-08-28: 75 ug via INTRAVENOUS
  Filled 2016-08-28: qty 2

## 2016-08-28 MED ORDER — LIDOCAINE-PRILOCAINE 2.5-2.5 % EX CREA
1.0000 "application " | TOPICAL_CREAM | CUTANEOUS | Status: DC | PRN
  Filled 2016-08-28: qty 5

## 2016-08-28 MED ORDER — BUPROPION HCL ER (XL) 150 MG PO TB24
300.0000 mg | ORAL_TABLET | ORAL | Status: DC
  Administered 2016-08-29 – 2016-08-30 (×2): 300 mg via ORAL
  Filled 2016-08-28 (×2): qty 2

## 2016-08-28 MED ORDER — HYDROCHLOROTHIAZIDE 12.5 MG PO CAPS
12.5000 mg | ORAL_CAPSULE | Freq: Every day | ORAL | Status: DC
  Administered 2016-08-29 – 2016-08-30 (×2): 12.5 mg via ORAL
  Filled 2016-08-28 (×2): qty 1

## 2016-08-28 MED ORDER — ACETAMINOPHEN 325 MG PO TABS
650.0000 mg | ORAL_TABLET | Freq: Four times a day (QID) | ORAL | Status: DC | PRN
  Administered 2016-08-29: 650 mg via ORAL
  Filled 2016-08-28: qty 2

## 2016-08-28 MED ORDER — CLONAZEPAM 0.5 MG PO TABS
1.0000 mg | ORAL_TABLET | Freq: Every day | ORAL | Status: DC
  Administered 2016-08-28 – 2016-08-29 (×2): 1 mg via ORAL
  Filled 2016-08-28 (×2): qty 2

## 2016-08-28 MED ORDER — FLUTICASONE FUROATE-VILANTEROL 100-25 MCG/INH IN AEPB
1.0000 | INHALATION_SPRAY | Freq: Every day | RESPIRATORY_TRACT | Status: DC | PRN
  Filled 2016-08-28: qty 28

## 2016-08-28 MED ORDER — DULOXETINE HCL 60 MG PO CPEP
60.0000 mg | ORAL_CAPSULE | ORAL | Status: DC
  Administered 2016-08-29 – 2016-08-30 (×2): 60 mg via ORAL
  Filled 2016-08-28 (×2): qty 1

## 2016-08-28 MED ORDER — METHYLPHENIDATE HCL 5 MG PO TABS
10.0000 mg | ORAL_TABLET | Freq: Two times a day (BID) | ORAL | Status: DC
  Administered 2016-08-28 – 2016-08-30 (×5): 10 mg via ORAL
  Filled 2016-08-28 (×5): qty 2

## 2016-08-28 MED ORDER — MORPHINE SULFATE (PF) 2 MG/ML IV SOLN: 2.0000 mg | INTRAVENOUS | Status: DC | PRN

## 2016-08-28 MED ORDER — GABAPENTIN 600 MG PO TABS
600.0000 mg | ORAL_TABLET | Freq: Three times a day (TID) | ORAL | Status: DC
  Administered 2016-08-28 – 2016-08-30 (×6): 600 mg via ORAL
  Filled 2016-08-28 (×6): qty 1

## 2016-08-28 NOTE — ED Triage Notes (Signed)
Pt reports to the ED following a fall. Upon EMS arrival she was sitting on the floor. Pt was seen here on Friday and given medication for a UTI and an infection in her leg which she has been compliant with. VSS en route. Pt denies any head injury or LOC. Per EMS she is w/c bound and when she moves from room to room and she keeps falling out of her w/c. She is requesting to be placed in a SNF. She states that she is not having any new pain from the fall she just complains of leg pain r/t the infection. Pt has a foley in place.

## 2016-08-28 NOTE — H&P (Signed)
[place] [facility] [place] NAME: [name]    MR#:  [id number]  DATE OF BIRTH:  [birth year]  DATE OF ADMISSION:  08/28/2016  PRIMARY CARE PHYSICIAN: Enid Derry, MD   REQUESTING/REFERRING PHYSICIAN: Dr. Aundria Rud  CHIEF COMPLAINT:   Chief Complaint  Patient presents with  . Fall    HISTORY OF PRESENT ILLNESS:  [name]  is a 66 y.o. female with a known history of Multiple sclerosis. She presents back to the hospital with worsening cellulitis of the left lower extremity. Failed outpatient treatment with Bactrim and Keflex. Patient states that she is having pain in her left leg. She scales it 10 out of 10 in intensity when she first came in. For other 10 intensity after pain medications given in the ER. She describes the pain as a hot poker pain. It's been worsening over the last week. She cannot put any weight on it and she cannot ambulate. She has been staying with her friends because her husband is fixing her bathroom. She was seen in the ER on 08/23/2016 and given antibiotics at that time. Hospitalist services were contacted for further evaluation.  PAST MEDICAL HISTORY:   Past Medical History:  Diagnosis Date  . ADHD   . Anxiety   . COPD (chronic obstructive pulmonary disease) ([place])   . Depression    major depressive  . Dyspnea    doe  . Edema    left leg  . Follicular lymphoma ([place])    B Cell  . Hypertension   . Hypotension    idiopathic  . Kyphoscoliosis and scoliosis 11/26/2011  . Morbid obesity ([place]) 01/05/2016  . Multiple sclerosis ([place])   . Multiple sclerosis ([place])    1980's  . Neuromuscular disorder ([place])   . Obstructive and reflux uropathy    foley  . Pain    atypical facial  . Peripheral vascular disease of lower extremity with ulceration ([place]) 11/08/2015  . Skin ulcer ([place]) 11/08/2015  . Weakness    generalized. has MS    PAST SURGICAL HISTORY:   Past Surgical History:  Procedure Laterality Date   . BACK SURGERY N/A 2002  . CYST EXCISION     lower back  . INGUINAL LYMPH NODE BIOPSY Left 07/04/2016   Procedure: INGUINAL LYMPH NODE BIOPSY;  Surgeon: Christene Lye, MD;  Location: [facility] ORS;  Service: General;  Laterality: Left;  . PORTACATH PLACEMENT N/A 07/22/2016   Procedure: INSERTION PORT-A-CATH;  Surgeon: Christene Lye, MD;  Location: [facility] ORS;  Service: General;  Laterality: N/A;  . TONSILLECTOMY AND ADENOIDECTOMY    . TUBAL LIGATION      SOCIAL HISTORY:   Social History  Substance Use Topics  . Smoking status: Former Smoker    Packs/day: 1.00    Years: 20.00    Types: Cigarettes    Quit date: 02/03/2016  . Smokeless tobacco: Never Used  . Alcohol use No    FAMILY HISTORY:   Family History  Problem Relation Age of Onset  . COPD Mother   . Diabetes Mother   . Heart failure Mother   . Alcohol abuse Father   . Kidney disease Father   . Kidney failure Father   . Arthritis Sister   . CAD Maternal Grandmother   . Stroke Maternal Grandfather   . Diabetes Sister   . Arthritis Sister   . Arthritis Brother     DRUG ALLERGIES:  No Known Allergies  REVIEW OF  SYSTEMS:  CONSTITUTIONAL: No fever, Positive for weight gain, positive for fatigue.  EYES: Positive for blurred vision. Supposed to wear glasses. EARS, NOSE, AND THROAT: No tinnitus or supposed to wear glasses ear pain. No sore throat. Trouble swallowing because she doesn't have any teeth to chew. RESPIRATORY: No cough. Always has shortness of breath. No wheezing or hemoptysis.  CARDIOVASCULAR: No chest pain, orthopnea, edema.  GASTROINTESTINAL: No nausea, vomiting, diarrhea. Positive for abdominal pain and constipation. No blood in bowel movements GENITOURINARY: Chronic Foley catheter with MS ENDOCRINE: No polyuria, nocturia,  HEMATOLOGY: No anemia, easy bruising or bleeding SKIN: Left leg redness MUSCULOSKELETAL: Left leg pain. Right arm pain NEUROLOGIC: No tingling, numbness, weakness.   PSYCHIATRY: Positive for depression.   MEDICATIONS AT HOME:   Prior to Admission medications   Medication Sig Start Date End Date Taking? Authorizing Provider  AVONEX PEN 30 MCG/0.5ML AJKT Inject 30 mcg into the skin every Monday. 04/28/16  Yes Historical Provider, MD  buPROPion (WELLBUTRIN XL) 300 MG 24 hr tablet Take 300 mg by mouth every morning.  05/28/16  Yes Historical Provider, MD  clonazePAM (KLONOPIN) 1 MG tablet Take 1 tablet by mouth at bedtime.  07/12/13  Yes Historical Provider, MD  DULoxetine (CYMBALTA) 60 MG capsule Take 60 mg by mouth every morning.  07/26/13  Yes Historical Provider, MD  gabapentin (NEURONTIN) 600 MG tablet TAKE 1 TABLET (600 MG TOTAL) BY MOUTH 3 (THREE) TIMES DAILY. 08/07/16  Yes Arnetha Courser, MD  hydrochlorothiazide (MICROZIDE) 12.5 MG capsule TAKE ONE CAPSULE (12.5 MG TOTAL) BY MOUTH EVERY DAY (THIS REPLACES TRIAMTERENE/HCTZ) 08/07/16  Yes Arnetha Courser, MD  methylphenidate (RITALIN) 10 MG tablet Take 10 mg by mouth 2 (two) times daily.  01/23/15  Yes Historical Provider, MD  Multiple Vitamin (MULTIVITAMIN WITH MINERALS) TABS tablet Take 1 tablet by mouth daily.   Yes Historical Provider, MD  potassium chloride (K-DUR) 10 MEQ tablet Take 1 tablet (10 mEq total) by mouth daily. 12/22/15  Yes Arnetha Courser, MD  QUEtiapine Fumarate (SEROQUEL XR) 150 MG 24 hr tablet 150 mg at bedtime.  08/14/16  Yes Historical Provider, MD  solifenacin (VESICARE) 5 MG tablet Take 5 mg by mouth daily.   Yes Historical Provider, MD  tiZANidine (ZANAFLEX) 4 MG tablet Take 4 mg by mouth 4 (four) times daily. TAKES FOR MS   Yes Historical Provider, MD  fluticasone furoate-vilanterol (BREO ELLIPTA) 100-25 MCG/INH AEPB Inhale 1 puff into the lungs daily. 01/05/16   Arnetha Courser, MD  lidocaine-prilocaine (EMLA) cream Apply 1 application topically as needed. apply cream over the port site and then place small piece of saran wrap over the cream to protect your clothing 08/14/16   Sindy Guadeloupe,  MD  polyethylene glycol powder (GLYCOLAX/MIRALAX) powder Take 17 g by mouth daily. Mixed in water Patient taking differently: Take 17 g by mouth as needed. Mixed in water 03/11/16   Arnetha Courser, MD  topiramate (TOPAMAX) 50 MG tablet TAKE 1 TABLET (50 MG TOTAL) BY MOUTH 2 (TWO) TIMES DAILY. Patient taking differently: Take 50 mg by mouth daily.  08/07/16   Arnetha Courser, MD      VITAL SIGNS:  Blood pressure (!) 151/69, pulse 74, temperature 98.1 F (36.7 C), temperature source Oral, resp. rate 16, SpO2 99 %.  PHYSICAL EXAMINATION:  GENERAL:  66 y.o.-year-old patient lying in the bed with no acute distress.  EYES: Pupils equal, round, reactive to light and accommodation. No scleral icterus. Extraocular muscles intact.  HEENT: Head atraumatic, normocephalic. Oropharynx and nasopharynx clear.  NECK:  Supple, no jugular venous distention. No thyroid enlargement, no tenderness.  LUNGS: Normal breath sounds bilaterally, no wheezing, rales,rhonchi or crepitation. No use of accessory muscles of respiration.  CARDIOVASCULAR: S1, S2 normal. No murmurs, rubs, or gallops.  ABDOMEN: Soft, nontender, nondistended. Bowel sounds present. No organomegaly or mass.  EXTREMITIES: 3+ pedal edema bilaterally. No cyanosis, or clubbing.  NEUROLOGIC: Cranial nerves II through XII are intact. Muscle strength 5/5 in all extremities. Sensation intact. Gait not checked.  PSYCHIATRIC: The patient is alert and oriented x 3.  SKIN: Circumferential erythema lower left leg, the redness also extending up into the left groin. Redness also extending down into the ankle. Right shin also has slight erythema  LABORATORY PANEL:   CBC  Recent Labs Lab 08/28/16 1112  WBC 6.2  HGB 12.1  HCT 36.0  PLT 271   ------------------------------------------------------------------------------------------------------------------  Chemistries   Recent Labs Lab 08/28/16 1112  NA 137  K 3.0*  CL 103  CO2 26  GLUCOSE 96   BUN 27*  CREATININE 1.83*  CALCIUM 9.4  AST 26  ALT 19  ALKPHOS 75  BILITOT 0.6   ------------------------------------------------------------------------------------------------------------------  Cardiac Enzymes  Recent Labs Lab 08/28/16 1112  TROPONINI <0.03   ------------------------------------------------------------------------------------------------------------------    IMPRESSION AND PLAN:   1. Cellulitis of the left lower extremity. Failed outpatient therapy. Start IV Ancef. When necessary pain medication. Physical therapy evaluation.  2. Hypokalemia replace potassium 3. MS with chronic Foley catheter. I would not send off a urine culture in this patient. 4. History of B-cell lymphoma with mass in the left groin 5. Acute kidney injury. IV fluid hydration. Could be secondary to the Bactrim that she was recently started on. 6. Weakness and left leg pain will get physical therapy evaluation 7. Morbid obesity weight loss as needed 8. Essential hypertension on hydrochlorothiazide. 9. Depression and anxiety and ADHD. continue usual psychiatric medications 10. History of COPD but respiratory status is stable at this point  All the records are reviewed and case discussed with ED provider. Management plans discussed with the patient, family and they are in agreement.  CODE STATUS: Full code  TOTAL TIME TAKING CARE OF THIS PATIENT: 50 minutes.    Loletha Grayer M.D on 08/28/2016 at 1:22 PM  Between 7am to 6pm - Pager - [phone]  After 6pm call admission pager [phone]  [facility]  [phone]  CC: Primary care physician; Enid Derry, MD

## 2016-08-28 NOTE — ED Provider Notes (Signed)
[facility] Emergency Department Provider Note  ____________________________________________  Time seen: Approximately 11:16 AM  I have reviewed the triage vital signs and the nursing notes.   HISTORY  Chief Complaint Fall    HPI [name] is a 66 y.o. female w/ a hx of multiple sclerosis and indwelling Foley catheter, B cell lymphomaon Rituxin, chronic left lower extremity edema and swelling, presenting for recurrent falls. The patient lives alone, uses a walker. On 4/27, she was seen in the emergency department with ultrasound of the left lower extremity negative for DVT and discharged home with oral Bactrim and Keflex for lower show a cellulitis and UTI. Today, the patient reports that she her baseline left lower extremity weakness has been exacerbated by her cellulitis and she has sustained 2 falls wherein she has landed on her bottom. She has not hit her head or lost consciousness. She denies any systemic symptoms including fevers, chills, nausea or vomiting. However, she has noted that while her erythema seems less red than yesterday on the left lower 70, she is now developed erythema on the right lower extremity and increased pain in the bilateral lower extremities.   Past Medical History:  Diagnosis Date  . ADHD   . Anxiety   . COPD (chronic obstructive pulmonary disease) ([place])   . Depression    major depressive  . Dyspnea    doe  . Edema    left leg  . Follicular lymphoma ([place])    B Cell  . Hypertension   . Hypotension    idiopathic  . Kyphoscoliosis and scoliosis 11/26/2011  . Morbid obesity ([place]) 01/05/2016  . Multiple sclerosis ([place])   . Multiple sclerosis ([place])    1980's  . Neuromuscular disorder ([place])   . Obstructive and reflux uropathy    foley  . Pain    atypical facial  . Peripheral vascular disease of lower extremity with ulceration ([place]) 11/08/2015  . Skin ulcer ([place]) 11/08/2015  . Weakness    generalized. has MS    Patient  Active Problem List   Diagnosis Date Noted  . Follicular lymphoma of intra-abdominal lymph nodes ([place]) 08/06/2016  . Multiple falls 06/07/2016  . Inguinal adenopathy 05/29/2016  . Breast cancer screening 01/05/2016  . Morbid obesity ([place]) 01/05/2016  . Pressure ulcer 12/19/2015  . Low HDL (under 40) 12/19/2015  . Cellulitis 12/18/2015  . Skin ulcer ([place]) 11/08/2015  . Peripheral vascular disease of lower extremity with ulceration ([place]) 11/08/2015  . CKD (chronic kidney disease) stage 3, GFR 30-59 ml/min 11/08/2015  . Decubitus ulcer of left thigh, stage 2 04/18/2015  . Constipation due to pain medication 01/31/2015  . Obstructive sleep apnea of adult 01/13/2015  . Pelvic muscle wasting 01/13/2015  . Incomplete bladder emptying 01/13/2015  . Headache, migraine 10/24/2014  . 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 Current tobacco use 10/24/2014  . Lumbar radiculopathy, chronic 10/02/2013  . COPD with bronchial hyperresponsiveness ([place]) 10/02/2013  . Major depressive disorder, recurrent, in partial remission ([place]) 10/02/2013  . Hypertension goal BP (blood pressure) < 140/90 10/02/2013  . Multiple sclerosis ([place]) 10/02/2013  . Absence of bladder continence 09/25/2012  . Acontractile bladder 02/12/2012  . Bladder retention 02/12/2012  . Narrowing of intervertebral disc space 11/26/2011  . Kyphoscoliosis and scoliosis 11/26/2011    Past Surgical History:  Procedure Laterality Date  . BACK SURGERY N/A 2002  . CYST EXCISION     lower back  . INGUINAL LYMPH NODE BIOPSY Left 07/04/2016  Procedure: INGUINAL LYMPH NODE BIOPSY;  Surgeon: Christene Lye, MD;  Location: [facility] ORS;  Service: General;  Laterality: Left;  . PORTACATH PLACEMENT N/A 07/22/2016   Procedure: INSERTION PORT-A-CATH;  Surgeon: Christene Lye, MD;  Location: [facility] ORS;  Service: General;  Laterality: N/A;  . TONSILLECTOMY AND ADENOIDECTOMY    . TUBAL LIGATION        Allergies Patient has no known  allergies.  Family History  Problem Relation Age of Onset  . COPD Mother   . Diabetes Mother   . Heart failure Mother   . Alcohol abuse Father   . Kidney disease Father   . Kidney failure Father   . Arthritis Sister   . CAD Maternal Grandmother   . Stroke Maternal Grandfather   . Diabetes Sister   . Arthritis Sister   . Arthritis Brother     Social History Social History  Substance Use Topics  . Smoking status: Former Smoker    Packs/day: 1.00    Years: 20.00    Types: Cigarettes    Quit date: 02/03/2016  . Smokeless tobacco: Never Used  . Alcohol use No    Review of Systems Constitutional: No fever/chills.No lightheadedness or syncope. Positive for fall. Did not hit head, no loss of consciousness. Eyes: No visual changes. ENT:  No congestion or rhinorrhea. Cardiovascular: Denies chest pain. Denies palpitations. Respiratory: Denies shortness of breath.  No cough. Gastrointestinal: No abdominal pain.  No nausea, no vomiting.  No diarrhea.  No constipation. Genitourinary: Negative for dysuria. Musculoskeletal: Negative for back pain. Skin: Positive for left greater than right lower extremity erythema, swelling, and tenderness. Neurological: Negative for headaches. No focal numbness, tingling or weakness.   10-point ROS otherwise negative.  ____________________________________________   PHYSICAL EXAM:  VITAL SIGNS: ED Triage Vitals  Enc Vitals Group     BP 08/28/16 1036 123/85     Pulse Rate 08/28/16 1036 78     Resp 08/28/16 1036 18     Temp 08/28/16 1036 98.1 F (36.7 C)     Temp Source 08/28/16 1036 Oral     SpO2 08/28/16 1036 100 %     Weight --      Height --      Head Circumference --      [place] --      Pain Score 08/28/16 1034 9     Pain Loc --      Pain Edu? --      Excl. in [place]? --     Constitutional: Alert and oriented. Chronically ill appearing and uncomfortable but nontoxic. Answers questions appropriately. Eyes: Conjunctivae are  normal.  EOMI. No scleral icterus. Head: Atraumatic. Nose: No congestion/rhinnorhea. Mouth/Throat: Mucous membranes are very dry.  Neck: No stridor.  Supple.  No JVD. No meningismus. Cardiovascular: Normal rate, regular rhythm. No murmurs, rubs or gallops.  Respiratory: Normal respiratory effort.  No accessory muscle use or retractions. Lungs CTAB.  No wheezes, rales or ronchi. Gastrointestinal: Morbidly obese. Soft, nontender and nondistended.  No guarding or rebound.  No peritoneal signs. Genitourinary: Indwelling Foley catheter in place with cloudy yellow urine output. Musculoskeletal: The patient has bilateral lower extremity edema, left greater than right. On the left lower extremity, she has diffuse swelling, erythema and tenderness to palpation 1 inch below the patella all the way distal through the foot and lymphatic streaking in the medial left thigh that has increased past skin markings that were made 4/27. In addition, the patient has an abrasion  on the left medial leg with minimal purulent discharge. On the right lower shotty, the patient has medial erythema involving the left heel, medial malleolus and distal leg. Normal DP and PT pulses bilaterally. 5 out of 5 motor strength bilaterally. Neurologic:  A&Ox3.  Speech is clear.  Face and smile are symmetric.  EOMI.  Moves all extremities well. Skin:  Skin is warm, dry. Psychiatric: Mood and affect are normal. Speech and behavior are normal.  Normal judgement.  ____________________________________________   LABS (all labs ordered are listed, but only abnormal results are displayed)  Labs Reviewed  CBC - Abnormal; Notable for the following:       Result Value   RDW 15.0 (*)    All other components within normal limits  COMPREHENSIVE METABOLIC PANEL - Abnormal; Notable for the following:    Potassium 3.0 (*)    BUN 27 (*)    Creatinine, Ser 1.83 (*)    GFR calc non Af Amer 28 (*)    GFR calc Af Amer 32 (*)    All other  components within normal limits  CK - Abnormal; Notable for the following:    Total CK 279 (*)    All other components within normal limits  CULTURE, BLOOD (ROUTINE X 2)  CULTURE, BLOOD (ROUTINE X 2)  TROPONIN I  URINALYSIS, COMPLETE (UACMP) WITH MICROSCOPIC   ____________________________________________  EKG  Not indicated ____________________________________________  RADIOLOGY  US Venous Img Lower Bilateral  Result Date: 08/28/2016 CLINICAL DATA:  Bilateral lower extremity swelling and erythema. EXAM: BILATERAL LOWER EXTREMITY VENOUS DOPPLER ULTRASOUND TECHNIQUE: Gray-scale sonography with graded compression, as well as color Doppler and duplex ultrasound were performed to evaluate the lower extremity deep venous systems from the level of the common femoral vein and including the common femoral, femoral, profunda femoral, popliteal and calf veins including the posterior tibial, peroneal and gastrocnemius veins when visible. The superficial great saphenous vein was also interrogated. Spectral Doppler was utilized to evaluate flow at rest and with distal augmentation maneuvers in the common femoral, femoral and popliteal veins. COMPARISON:  None. FINDINGS: RIGHT LOWER EXTREMITY Common Femoral Vein: No evidence of thrombus. Normal compressibility, respiratory phasicity and response to augmentation. Saphenofemoral Junction: No evidence of thrombus. Normal compressibility and flow on color Doppler imaging. Profunda Femoral Vein: No evidence of thrombus. Normal compressibility and flow on color Doppler imaging. Femoral Vein: No evidence of thrombus. Normal compressibility, respiratory phasicity and response to augmentation. Popliteal Vein: No evidence of thrombus. Normal compressibility, respiratory phasicity and response to augmentation. Calf Veins: No evidence of thrombus. Normal compressibility and flow on color Doppler imaging. Superficial Great Saphenous Vein: No evidence of thrombus. Normal  compressibility and flow on color Doppler imaging. LEFT LOWER EXTREMITY Common Femoral Vein: No evidence of thrombus. Normal compressibility, respiratory phasicity and response to augmentation. Saphenofemoral Junction: No evidence of thrombus. Normal compressibility and flow on color Doppler imaging. Profunda Femoral Vein: No evidence of thrombus. Normal compressibility and flow on color Doppler imaging. Femoral Vein: No evidence of thrombus. Normal compressibility, respiratory phasicity and response to augmentation. Popliteal Vein: No evidence of thrombus. Normal compressibility, respiratory phasicity and response to augmentation. Calf Veins: No evidence of thrombus. Normal compressibility and flow on color Doppler imaging. Superficial Great Saphenous Vein: No evidence of thrombus. Normal compressibility and flow on color Doppler imaging. Other Findings: Hypoechoic cystic lesion the LEFT groin again noted measuring 7.2 x 5.6 x 3.6 cm IMPRESSION: No evidence of DVT with either lower extremity. A 7 cm cystic lesion  in the LEFT groin. Electronically Signed   By: Suzy Bouchard M.D.   On: 08/28/2016 13:21    ____________________________________________   PROCEDURES  Procedure(s) performed: None  Procedures  Critical Care performed: No ____________________________________________   INITIAL IMPRESSION / ASSESSMENT AND PLAN / ED COURSE  Pertinent labs & imaging results that were available during my care of the patient were reviewed by me and considered in my medical decision making (see chart for details).  66 y.o. female with multiple chronic comorbidities, including lymphoma currently under chemotherapy, on oral antibiotics for left lower 70 cellulitis here for falls resulted from left lower extremity weakness and worsening cellulitis. Overall, I am most concerned that the patient is feeling oral treatment of her cellulitis, and may now be developing right sided cellulitis as well. I will initiate  vancomycin intravenously, as well as intravenous fluids. She will have repeat ultrasounds bilaterally to rule out DVT, although I'm less suspicious of this. We will also get blood and urine cultures, given that the patient is undergoing chemotherapy. I do not see any evidence of acute injury from the patient's falls. The patient will require admission.  ____________________________________________  FINAL CLINICAL IMPRESSION(S) / ED DIAGNOSES  Final diagnoses:  Bilateral lower leg cellulitis  Recurrent falls while walking         NEW MEDICATIONS STARTED DURING THIS VISIT:  Current Discharge Medication List       Eula Listen, MD 08/28/16 1500

## 2016-08-28 NOTE — Progress Notes (Signed)
Pharmacy Antibiotic Note  [name] is a 66 y.o. female admitted on 08/28/2016 with cellulitis.  Pharmacy has been consulted for cefazolin dosing.  Plan: Cefazolin 1 g IV q8h  Temp (24hrs), Avg:98.1 F (36.7 C), Min:98.1 F (36.7 C), Max:98.1 F (36.7 C)   Recent Labs Lab 08/22/16 0832 08/23/16 1630 08/28/16 1112  WBC 7.0 8.6 6.2  CREATININE 1.43* 1.40* 1.83*    Estimated Creatinine Clearance: 38.8 mL/min (A) (by C-G formula based on SCr of 1.83 mg/dL (H)).    No Known Allergies  Antimicrobials this admission: cefazolin 5/2 >>   Dose adjustments this admission:  Microbiology results: 5/2 BCx: Sent 5/2 UCx: Sent   Thank you for allowing pharmacy to be a part of this patient's care.  Lenis Noon, PharmD, BCPS Clinical Pharmacist 08/28/2016 12:51 PM

## 2016-08-29 ENCOUNTER — Inpatient Hospital Stay: Payer: Medicare Other | Attending: Oncology

## 2016-08-29 ENCOUNTER — Inpatient Hospital Stay: Payer: Medicare Other

## 2016-08-29 LAB — BASIC METABOLIC PANEL
Anion gap: 7 (ref 5–15)
BUN: 25 mg/dL — ABNORMAL HIGH (ref 6–20)
CO2: 24 mmol/L (ref 22–32)
Calcium: 8.7 mg/dL — ABNORMAL LOW (ref 8.9–10.3)
Chloride: 107 mmol/L (ref 101–111)
Creatinine, Ser: 1.68 mg/dL — ABNORMAL HIGH (ref 0.44–1.00)
GFR calc Af Amer: 36 mL/min — ABNORMAL LOW (ref >60)
GFR calc non Af Amer: 31 mL/min — ABNORMAL LOW (ref >60)
Glucose, Bld: 110 mg/dL — ABNORMAL HIGH (ref 65–99)
Potassium: 3.6 mmol/L (ref 3.5–5.1)
Sodium: 138 mmol/L (ref 135–145)

## 2016-08-29 LAB — CBC
HCT: 32.7 % — ABNORMAL LOW (ref 35.0–47.0)
Hemoglobin: 11.1 g/dL — ABNORMAL LOW (ref 12.0–16.0)
MCH: 28.6 pg (ref 26.0–34.0)
MCHC: 34.1 g/dL (ref 32.0–36.0)
MCV: 83.8 fL (ref 80.0–100.0)
Platelets: 261 10*3/uL (ref 150–440)
RBC: 3.9 MIL/uL (ref 3.80–5.20)
RDW: 15.5 % — ABNORMAL HIGH (ref 11.5–14.5)
WBC: 5.3 10*3/uL (ref 3.6–11.0)

## 2016-08-29 LAB — MRSA PCR SCREENING: MRSA by PCR: NEGATIVE

## 2016-08-29 NOTE — Clinical Social Work Note (Signed)
Clinical Social Work Assessment  Patient Details  Name: [name] MRN: [id number] Date of Birth: 02/25/1951  Date of referral:  08/29/16               Reason for consult:  Facility Placement                Permission sought to share information with:  Facility Sport and exercise psychologist, Family Supports Permission granted to share information::  Yes, Verbal Permission Granted  Name::        Agency::     Relationship::     Contact Information:     Housing/Transportation Living arrangements for the past 2 months:  Single Family Home Source of Information:  Patient Patient Interpreter Needed:  None Criminal Activity/Legal Involvement Pertinent to Current Situation/Hospitalization:  No - Comment as needed Significant Relationships:  Spouse Lives with:  Spouse Do you feel safe going back to the place where you live?  Yes Need for family participation in patient care:  Yes (Comment)  Care giving concerns:  Patient resides with her spouse.   Social Worker assessment / plan:  CSW received consult for potential placement. PT assessed patient this afternoon and has recommended STR. CSW met with patient and her spouse and patient confirmed that she was in agreement with STR again and that she has been to [facility] recently and would like to go again if possible. CSW explained a bedsearch would be conducted and that I would follow up with any bed offers. Patient has a pasrr that is going to expire on 09/07/16 and went to a level 2.   Patient has been battling with a chronic medical illness: multiple sclerosis and believes at this time her care requires more than she or her husband can manage at home.   Employment status:  Disabled (Comment on whether or not currently receiving Disability) Insurance information:  Managed Medicare PT Recommendations:  [place] / Referral to community resources:     Patient/Family's Response to care:  Patient and husband expressed  appreciation for CSW assistance.  Patient/Family's Understanding of and Emotional Response to Diagnosis, Current Treatment, and Prognosis:  Patient is aware of her limitations and she is requesting rehab.   Emotional Assessment Appearance:  Appears stated age Attitude/Demeanor/Rapport:   (fatigued but cooperative and pleasant) Affect (typically observed):  Accepting, Adaptable, Calm Orientation:  Oriented to Self, Oriented to Place, Oriented to  Time, Oriented to Situation Alcohol / Substance use:  Not Applicable Psych involvement (Current and /or in the community):  No (Comment)  Discharge Needs  Concerns to be addressed:  Care Coordination Readmission within the last 30 days:  No Current discharge risk:  None Barriers to Discharge:  No Barriers Identified   Shela Leff, [place] 08/29/2016, 3:30 PM
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
## 2016-08-29 NOTE — Progress Notes (Signed)
[place] [facility] [place] NAME: [name]    MR#:  [id number]  DATE OF BIRTH:  17-Aug-1950  SUBJECTIVE:  CHIEF COMPLAINT:   Chief Complaint  Patient presents with  . Fall     Came with swelling and redness on leg, have failed oral ABx for 2-3 days. No DVT. Redness improved after IV Abx.  REVIEW OF SYSTEMS:  CONSTITUTIONAL: No fever, fatigue or weakness.  EYES: No blurred or double vision.  EARS, NOSE, AND THROAT: No tinnitus or ear pain.  RESPIRATORY: No cough, shortness of breath, wheezing or hemoptysis.  CARDIOVASCULAR: No chest pain, orthopnea, edema.  GASTROINTESTINAL: No nausea, vomiting, diarrhea or abdominal pain.  GENITOURINARY: No dysuria, hematuria.  ENDOCRINE: No polyuria, nocturia,  HEMATOLOGY: No anemia, easy bruising or bleeding SKIN: No rash or lesion. MUSCULOSKELETAL: No joint pain or arthritis.   NEUROLOGIC: No tingling, numbness, weakness.  PSYCHIATRY: No anxiety or depression.   ROS  DRUG ALLERGIES:  No Known Allergies  VITALS:  Blood pressure (!) 114/54, pulse 74, temperature 98.3 F (36.8 C), temperature source Oral, resp. rate 20, height 5\' 3"  (1.6 m), weight 135.6 kg (299 lb), SpO2 97 %.  PHYSICAL EXAMINATION:   GENERAL:  66 y.o.-year-old patient lying in the bed with no acute distress.  EYES: Pupils equal, round, reactive to light and accommodation. No scleral icterus. Extraocular muscles intact.  HEENT: Head atraumatic, normocephalic. Oropharynx and nasopharynx clear.  NECK:  Supple, no jugular venous distention. No thyroid enlargement, no tenderness.  LUNGS: Normal breath sounds bilaterally, no wheezing, rales,rhonchi or crepitation. No use of accessory muscles of respiration.  CARDIOVASCULAR: S1, S2 normal. No murmurs, rubs, or gallops.  ABDOMEN: Soft, nontender, nondistended. Bowel sounds present. No organomegaly or mass.  EXTREMITIES: 3+ pedal edema bilaterally. No cyanosis, or clubbing.  NEUROLOGIC:  Cranial nerves II through XII are intact. Muscle strength 5/5 in all extremities. Sensation intact. Gait not checked.  PSYCHIATRIC: The patient is alert and oriented x 3.  SKIN: Circumferential erythema lower left leg, the redness also extending up into the left groin. Redness also extending down into the ankle. Right shin also has slight erythema Physical Exam LABORATORY PANEL:   CBC  Recent Labs Lab 08/29/16 0441  WBC 5.3  HGB 11.1*  HCT 32.7*  PLT 261   ------------------------------------------------------------------------------------------------------------------  Chemistries   Recent Labs Lab 08/28/16 1112 08/29/16 0441  NA 137 138  K 3.0* 3.6  CL 103 107  CO2 26 24  GLUCOSE 96 110*  BUN 27* 25*  CREATININE 1.83* 1.68*  CALCIUM 9.4 8.7*  AST 26  --   ALT 19  --   ALKPHOS 75  --   BILITOT 0.6  --    ------------------------------------------------------------------------------------------------------------------  Cardiac Enzymes  Recent Labs Lab 08/28/16 1112  TROPONINI <0.03   ------------------------------------------------------------------------------------------------------------------  RADIOLOGY:  US Venous Img Lower Bilateral  Result Date: 08/28/2016 CLINICAL DATA:  Bilateral lower extremity swelling and erythema. EXAM: BILATERAL LOWER EXTREMITY VENOUS DOPPLER ULTRASOUND TECHNIQUE: Gray-scale sonography with graded compression, as well as color Doppler and duplex ultrasound were performed to evaluate the lower extremity deep venous systems from the level of the common femoral vein and including the common femoral, femoral, profunda femoral, popliteal and calf veins including the posterior tibial, peroneal and gastrocnemius veins when visible. The superficial great saphenous vein was also interrogated. Spectral Doppler was utilized to evaluate flow at rest and with distal augmentation maneuvers in the common femoral, femoral and popliteal veins.  COMPARISON:  None. FINDINGS: RIGHT LOWER EXTREMITY Common Femoral Vein: No evidence of thrombus. Normal compressibility, respiratory phasicity and response to augmentation. Saphenofemoral Junction: No evidence of thrombus. Normal compressibility and flow on color Doppler imaging. Profunda Femoral Vein: No evidence of thrombus. Normal compressibility and flow on color Doppler imaging. Femoral Vein: No evidence of thrombus. Normal compressibility, respiratory phasicity and response to augmentation. Popliteal Vein: No evidence of thrombus. Normal compressibility, respiratory phasicity and response to augmentation. Calf Veins: No evidence of thrombus. Normal compressibility and flow on color Doppler imaging. Superficial Great Saphenous Vein: No evidence of thrombus. Normal compressibility and flow on color Doppler imaging. LEFT LOWER EXTREMITY Common Femoral Vein: No evidence of thrombus. Normal compressibility, respiratory phasicity and response to augmentation. Saphenofemoral Junction: No evidence of thrombus. Normal compressibility and flow on color Doppler imaging. Profunda Femoral Vein: No evidence of thrombus. Normal compressibility and flow on color Doppler imaging. Femoral Vein: No evidence of thrombus. Normal compressibility, respiratory phasicity and response to augmentation. Popliteal Vein: No evidence of thrombus. Normal compressibility, respiratory phasicity and response to augmentation. Calf Veins: No evidence of thrombus. Normal compressibility and flow on color Doppler imaging. Superficial Great Saphenous Vein: No evidence of thrombus. Normal compressibility and flow on color Doppler imaging. Other Findings: Hypoechoic cystic lesion the LEFT groin again noted measuring 7.2 x 5.6 x 3.6 cm IMPRESSION: No evidence of DVT with either lower extremity. A 7 cm cystic lesion in the LEFT groin. Electronically Signed   By: Suzy Bouchard M.D.   On: 08/28/2016 13:21    ASSESSMENT AND PLAN:   Active  Problems:   Cellulitis  1. Cellulitis of the left lower extremity. Failed outpatient therapy. on IV Ancef. When necessary pain medication. Physical therapy evaluation.    No DVT. 2. Hypokalemia replace potassium 3. MS with chronic Foley catheter. Cont for now. 4. History of B-cell lymphoma with mass in the left groin. 5. Acute kidney injury. IV fluid hydration. Could be secondary to the Bactrim that she was recently started on. IV fluids. 6. Weakness and left leg pain will get physical therapy evaluation 7. Morbid obesity weight loss as needed. 8. Essential hypertension on hydrochlorothiazide. 9. Depression and anxiety and ADHD. continue usual psychiatric medications 10. History of COPD but respiratory status is stable at this point   All the records are reviewed and case discussed with Care Management/Social Workerr. Management plans discussed with the patient, family and they are in agreement.  CODE STATUS: Full.  TOTAL TIME TAKING CARE OF THIS PATIENT: 35 minutes.   POSSIBLE D/C IN 1-2 DAYS, DEPENDING ON CLINICAL CONDITION.   Vaughan Basta M.D on 08/29/2016   Between 7am to 6pm - Pager - [phone]  After 6pm go to [email or web address] - password [email or web address] [facility]  Office  [phone]  CC: Primary care physician; Enid Derry, MD  Note: This dictation was prepared with Dragon dictation along with smaller phrase technology. Any transcriptional errors that result from this process are unintentional.

## 2016-08-29 NOTE — Progress Notes (Signed)
Patient placed in a size wise bed, patient able to pivot and transfer to the bed 3 person assist.

## 2016-08-29 NOTE — NC FL2 (Signed)
[place] LEVEL OF CARE SCREENING TOOL     IDENTIFICATION  Patient Name: [name] Birthdate: [birth year] Sex: female Admission Date (Current Location): 08/28/2016  [place] and [place] Number:  Engineering geologist and Address:  [facility], [address] [place], [place] [address]      Provider Number: [id number]  Attending Physician Name and Address:  Vaughan Basta, MD  Relative Name and Phone Number:       Current Level of Care: Hospital Recommended Level of Care: [place] Prior Approval Number:    Date Approved/Denied:   PASRR Number:    Discharge Plan: SNF    Current Diagnoses: Patient Active Problem List   Diagnosis Date Noted  . Follicular lymphoma of intra-abdominal lymph nodes ([place]) 08/06/2016  . Multiple falls 06/07/2016  . Inguinal adenopathy 05/29/2016  . Breast cancer screening 01/05/2016  . Morbid obesity ([place]) 01/05/2016  . Pressure ulcer 12/19/2015  . Low HDL (under 40) 12/19/2015  . Cellulitis 12/18/2015  . Skin ulcer ([place]) 11/08/2015  . Peripheral vascular disease of lower extremity with ulceration ([place]) 11/08/2015  . CKD (chronic kidney disease) stage 3, GFR 30-59 ml/min 11/08/2015  . Decubitus ulcer of left thigh, stage 2 04/18/2015  . Constipation due to pain medication 01/31/2015  . Obstructive sleep apnea of adult 01/13/2015  . Pelvic muscle wasting 01/13/2015  . Incomplete bladder emptying 01/13/2015  . Headache, migraine 10/24/2014  . 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 Current tobacco use 10/24/2014  . Lumbar radiculopathy, chronic 10/02/2013  . COPD with bronchial hyperresponsiveness ([place]) 10/02/2013  . Major depressive disorder, recurrent, in partial remission ([place]) 10/02/2013  . Hypertension goal BP (blood pressure) < 140/90 10/02/2013  . Multiple sclerosis ([place]) 10/02/2013  . Absence of bladder continence 09/25/2012  . Acontractile bladder 02/12/2012  . Bladder  retention 02/12/2012  . Narrowing of intervertebral disc space 11/26/2011  . Kyphoscoliosis and scoliosis 11/26/2011    Orientation RESPIRATION BLADDER Height & Weight     Self, Time, Situation, Place  Normal Continent Weight: 299 lb (135.6 kg) Height:  5\' 3"  (160 cm)  BEHAVIORAL SYMPTOMS/MOOD NEUROLOGICAL BOWEL NUTRITION STATUS   (none)  (none) Continent Diet (soft)  AMBULATORY STATUS COMMUNICATION OF NEEDS Skin   Extensive Assist Verbally PU Stage and Appropriate Care                       Personal Care Assistance Level of Assistance  Bathing, Dressing Bathing Assistance: Limited assistance   Dressing Assistance: Limited assistance     Functional Limitations Info   (no issues)          SPECIAL CARE FACTORS FREQUENCY  PT (By licensed PT)                    Contractures Contractures Info: Not present    Additional Factors Info  Code Status, Allergies Code Status Info: full Allergies Info: nka           Current Medications (08/29/2016):  This is the current hospital active medication list Current Facility-Administered Medications  Medication Dose Route Frequency Provider Last Rate Last Dose  . 0.9 %  sodium chloride infusion   Intravenous Continuous Loletha Grayer, MD 40 mL/hr at 08/28/16 1831    . acetaminophen (TYLENOL) tablet 650 mg  650 mg Oral Q6H PRN Loletha Grayer, MD   650 mg at 08/29/16 0920   Or  . acetaminophen (TYLENOL) suppository 650 mg  650 mg Rectal  Q6H PRN Loletha Grayer, MD      . buPROPion (WELLBUTRIN XL) 24 hr tablet 300 mg  300 mg Oral Delsa Bern, MD   300 mg at 08/29/16 7858  . ceFAZolin (ANCEF) IVPB 1 g/50 mL premix  1 g Intravenous [address] [place], RPH 100 mL/hr at 08/29/16 1405 1 g at 08/29/16 1405  . clonazePAM (KLONOPIN) tablet 1 mg  1 mg Oral QHS Loletha Grayer, MD   1 mg at 08/28/16 2046  . DULoxetine (CYMBALTA) DR capsule 60 mg  60 mg Oral Delsa Bern, MD   60 mg at 08/29/16 8502  . enoxaparin  (LOVENOX) injection 40 mg  40 mg Subcutaneous Q24H Loletha Grayer, MD   40 mg at 08/28/16 2043  . fluticasone furoate-vilanterol (BREO ELLIPTA) 100-25 MCG/INH 1 puff  1 puff Inhalation Daily PRN Loletha Grayer, MD      . gabapentin (NEURONTIN) tablet 600 mg  600 mg Oral TID Loletha Grayer, MD   600 mg at 08/29/16 0920  . hydrochlorothiazide (MICROZIDE) capsule 12.5 mg  12.5 mg Oral Daily Loletha Grayer, MD   12.5 mg at 08/29/16 7741  . lidocaine-prilocaine (EMLA) cream 1 application  1 application Topical PRN Loletha Grayer, MD      . methylphenidate (RITALIN) tablet 10 mg  10 mg Oral BID Loletha Grayer, MD   10 mg at 08/29/16 0921  . morphine 2 MG/ML injection 2 mg  2 mg Intravenous Q3H PRN Loletha Grayer, MD      . multivitamin with minerals tablet 1 tablet  1 tablet Oral Daily Loletha Grayer, MD   1 tablet at 08/29/16 0920  . oxyCODONE (Oxy IR/ROXICODONE) immediate release tablet 5 mg  5 mg Oral Q4H PRN Loletha Grayer, MD   5 mg at 08/28/16 2043  . polyethylene glycol (MIRALAX / GLYCOLAX) packet 17 g  17 g Oral PRN Loletha Grayer, MD   17 g at 08/28/16 1825  . potassium chloride (K-DUR,KLOR-CON) CR tablet 10 mEq  10 mEq Oral Daily Loletha Grayer, MD   10 mEq at 08/29/16 0927  . QUEtiapine (SEROQUEL) tablet 150 mg  150 mg Oral QHS Loletha Grayer, MD   150 mg at 08/28/16 2047  . tiZANidine (ZANAFLEX) tablet 4 mg  4 mg Oral QID Loletha Grayer, MD   4 mg at 08/29/16 1405  . topiramate (TOPAMAX) tablet 50 mg  50 mg Oral QHS Loletha Grayer, MD   50 mg at 08/28/16 2045     Discharge Medications: Please see discharge summary for a list of discharge medications.  Relevant Imaging Results:  Relevant Lab Results:   Additional Information ss: [id number]  Shela Leff, LCSW

## 2016-08-30 DIAGNOSIS — I2729 Other secondary pulmonary hypertension: Secondary | ICD-10-CM | POA: Diagnosis not present

## 2016-08-30 DIAGNOSIS — B029 Zoster without complications: Secondary | ICD-10-CM | POA: Diagnosis not present

## 2016-08-30 DIAGNOSIS — N139 Obstructive and reflux uropathy, unspecified: Secondary | ICD-10-CM | POA: Diagnosis not present

## 2016-08-30 DIAGNOSIS — R339 Retention of urine, unspecified: Secondary | ICD-10-CM | POA: Diagnosis not present

## 2016-08-30 DIAGNOSIS — E876 Hypokalemia: Secondary | ICD-10-CM | POA: Diagnosis not present

## 2016-08-30 DIAGNOSIS — G8911 Acute pain due to trauma: Secondary | ICD-10-CM | POA: Diagnosis not present

## 2016-08-30 DIAGNOSIS — R531 Weakness: Secondary | ICD-10-CM | POA: Diagnosis not present

## 2016-08-30 DIAGNOSIS — M6281 Muscle weakness (generalized): Secondary | ICD-10-CM | POA: Diagnosis not present

## 2016-08-30 DIAGNOSIS — I10 Essential (primary) hypertension: Secondary | ICD-10-CM | POA: Diagnosis not present

## 2016-08-30 DIAGNOSIS — L89899 Pressure ulcer of other site, unspecified stage: Secondary | ICD-10-CM | POA: Diagnosis not present

## 2016-08-30 DIAGNOSIS — R2681 Unsteadiness on feet: Secondary | ICD-10-CM | POA: Diagnosis not present

## 2016-08-30 DIAGNOSIS — Z5189 Encounter for other specified aftercare: Secondary | ICD-10-CM | POA: Diagnosis not present

## 2016-08-30 DIAGNOSIS — R1312 Dysphagia, oropharyngeal phase: Secondary | ICD-10-CM | POA: Diagnosis not present

## 2016-08-30 DIAGNOSIS — I739 Peripheral vascular disease, unspecified: Secondary | ICD-10-CM | POA: Diagnosis not present

## 2016-08-30 DIAGNOSIS — G35 Multiple sclerosis: Secondary | ICD-10-CM | POA: Diagnosis not present

## 2016-08-30 DIAGNOSIS — L03116 Cellulitis of left lower limb: Secondary | ICD-10-CM | POA: Diagnosis not present

## 2016-08-30 DIAGNOSIS — L97929 Non-pressure chronic ulcer of unspecified part of left lower leg with unspecified severity: Secondary | ICD-10-CM | POA: Diagnosis not present

## 2016-08-30 DIAGNOSIS — K5900 Constipation, unspecified: Secondary | ICD-10-CM | POA: Diagnosis not present

## 2016-08-30 DIAGNOSIS — C8293 Follicular lymphoma, unspecified, intra-abdominal lymph nodes: Secondary | ICD-10-CM | POA: Diagnosis not present

## 2016-08-30 MED ORDER — AMOXICILLIN-POT CLAVULANATE 875-125 MG PO TABS: 1.0000 | ORAL_TABLET | Freq: Two times a day (BID) | ORAL | 0 refills | Status: AC

## 2016-08-30 MED ORDER — OXYCODONE HCL 5 MG PO TABS: 5.0000 mg | ORAL_TABLET | Freq: Four times a day (QID) | ORAL | 0 refills | Status: DC | PRN

## 2016-08-30 NOTE — Discharge Summary (Signed)
[facility] [place] NAME: [name]    MR#:  [id number]  DATE OF BIRTH:  May 02, 1950  DATE OF ADMISSION:  08/28/2016 ADMITTING PHYSICIAN: Loletha Grayer, MD  DATE OF DISCHARGE: 08/30/2016  PRIMARY CARE PHYSICIAN: Enid Derry, MD    ADMISSION DIAGNOSIS:  Bilateral lower leg cellulitis [L03.116, L03.115] Recurrent falls while walking [R29.6]  DISCHARGE DIAGNOSIS:  Active Problems:   Cellulitis   SECONDARY DIAGNOSIS:   Past Medical History:  Diagnosis Date  . ADHD   . Anxiety   . COPD (chronic obstructive pulmonary disease) ([place])   . Depression    major depressive  . Dyspnea    doe  . Edema    left leg  . Follicular lymphoma ([place])    B Cell  . Hypertension   . Hypotension    idiopathic  . Kyphoscoliosis and scoliosis 11/26/2011  . Morbid obesity ([place]) 01/05/2016  . Multiple sclerosis ([place])   . Multiple sclerosis ([place])    1980's  . Neuromuscular disorder ([place])   . Obstructive and reflux uropathy    foley  . Pain    atypical facial  . Peripheral vascular disease of lower extremity with ulceration ([place]) 11/08/2015  . Skin ulcer ([place]) 11/08/2015  . Weakness    generalized. has MS    HOSPITAL COURSE:   1. Cellulitis of the left lower extremity. Failed outpatient therapy. on IV Ancef. When necessary pain medication. Physical therapy evaluation suggest need for SNF.    No DVT.   Redness and swelling improved, will give oral augmentin for 7 days on d/c. 2. Hypokalemia replace potassium 3. MS with chronic Foley catheter. Cont for now. 4. History of B-cell lymphoma with mass in the left groin. 5. Acute kidney injury. IV fluid hydration. Could be secondary to the Bactrim that she was recently started on. IV fluids. 6. Weakness and left leg pain , SNF per physical therapy evaluation 7. Morbid obesity weight loss as needed. 8. Essential hypertension on hydrochlorothiazide. 9. Depression and anxiety and ADHD. continue  usual psychiatric medications 10. History of COPD but respiratory status is stable at this point  DISCHARGE CONDITIONS:   Stable.  CONSULTS OBTAINED:    DRUG ALLERGIES:  No Known Allergies  DISCHARGE MEDICATIONS:   Current Discharge Medication List    START taking these medications   Details  amoxicillin-clavulanate (AUGMENTIN) 875-125 MG tablet Take 1 tablet by mouth 2 (two) times daily. Qty: 14 tablet, Refills: 0    oxyCODONE (OXY IR/ROXICODONE) 5 MG immediate release tablet Take 1 tablet (5 mg total) by mouth every 6 (six) hours as needed for moderate pain or severe pain. Qty: 20 tablet, Refills: 0      CONTINUE these medications which have NOT CHANGED   Details  AVONEX PEN 30 MCG/0.5ML AJKT Inject 30 mcg into the skin every Monday.    buPROPion (WELLBUTRIN XL) 300 MG 24 hr tablet Take 300 mg by mouth every morning.    Associated Diagnoses: Malignant lymphoma, undifferentiated cell, non-Burkitt's (HCC)    clonazePAM (KLONOPIN) 1 MG tablet Take 1 tablet by mouth at bedtime.     DULoxetine (CYMBALTA) 60 MG capsule Take 60 mg by mouth every morning.     gabapentin (NEURONTIN) 600 MG tablet TAKE 1 TABLET (600 MG TOTAL) BY MOUTH 3 (THREE) TIMES DAILY. Qty: 90 tablet, Refills: 2    hydrochlorothiazide (MICROZIDE) 12.5 MG capsule TAKE ONE CAPSULE (12.5 MG TOTAL) BY MOUTH EVERY DAY (THIS REPLACES TRIAMTERENE/HCTZ) Qty:  30 capsule, Refills: 5    methylphenidate (RITALIN) 10 MG tablet Take 10 mg by mouth 2 (two) times daily.  Refills: 0    Multiple Vitamin (MULTIVITAMIN WITH MINERALS) TABS tablet Take 1 tablet by mouth daily.    potassium chloride (K-DUR) 10 MEQ tablet Take 1 tablet (10 mEq total) by mouth daily. Qty: 90 tablet, Refills: 1    QUEtiapine Fumarate (SEROQUEL XR) 150 MG 24 hr tablet 150 mg at bedtime.  Refills: 4   Associated Diagnoses: Follicular lymphoma of intra-abdominal lymph nodes, unspecified follicular lymphoma type ([place]); Visit for monitoring  Rituxan therapy    solifenacin (VESICARE) 5 MG tablet Take 5 mg by mouth daily.    tiZANidine (ZANAFLEX) 4 MG tablet Take 4 mg by mouth 4 (four) times daily. TAKES FOR MS    fluticasone furoate-vilanterol (BREO ELLIPTA) 100-25 MCG/INH AEPB Inhale 1 puff into the lungs daily. Qty: 1 each, Refills: 11    lidocaine-prilocaine (EMLA) cream Apply 1 application topically as needed. apply cream over the port site and then place small piece of saran wrap over the cream to protect your clothing Qty: 30 g, Refills: 1    polyethylene glycol powder (GLYCOLAX/MIRALAX) powder Take 17 g by mouth daily. Mixed in water Qty: 850 g, Refills: 2   Associated Diagnoses: Constipation due to pain medication    topiramate (TOPAMAX) 50 MG tablet TAKE 1 TABLET (50 MG TOTAL) BY MOUTH 2 (TWO) TIMES DAILY. Qty: 60 tablet, Refills: 2         DISCHARGE INSTRUCTIONS:    Follow with PMD in 1 week.  If you experience worsening of your admission symptoms, develop shortness of breath, life threatening emergency, suicidal or homicidal thoughts you must seek medical attention immediately by calling 911 or calling your MD immediately  if symptoms less severe.  You Must read complete instructions/literature along with all the possible adverse reactions/side effects for all the Medicines you take and that have been prescribed to you. Take any new Medicines after you have completely understood and accept all the possible adverse reactions/side effects.   Please note  You were cared for by a hospitalist during your hospital stay. If you have any questions about your discharge medications or the care you received while you were in the hospital after you are discharged, you can call the unit and asked to speak with the hospitalist on call if the hospitalist that took care of you is not available. Once you are discharged, your primary care physician will handle any further medical issues. Please note that NO REFILLS for any  discharge medications will be authorized once you are discharged, as it is imperative that you return to your primary care physician (or establish a relationship with a primary care physician if you do not have one) for your aftercare needs so that they can reassess your need for medications and monitor your lab values.    Today   CHIEF COMPLAINT:   Chief Complaint  Patient presents with  . Fall    HISTORY OF PRESENT ILLNESS:  [name]  is a 66 y.o. female with a known history of Multiple sclerosis. She presents back to the hospital with worsening cellulitis of the left lower extremity. Failed outpatient treatment with Bactrim and Keflex. Patient states that she is having pain in her left leg. She scales it 10 out of 10 in intensity when she first came in. For other 10 intensity after pain medications given in the ER. She describes the pain as  a hot poker pain. It's been worsening over the last week. She cannot put any weight on it and she cannot ambulate. She has been staying with her friends because her husband is fixing her bathroom. She was seen in the ER on 08/23/2016 and given antibiotics at that time. Hospitalist services were contacted for further evaluation.   VITAL SIGNS:  Blood pressure (!) 124/59, pulse 66, temperature 97.8 F (36.6 C), temperature source Oral, resp. rate 14, height 5\' 3"  (1.6 m), weight 135.6 kg (299 lb), SpO2 94 %.  I/O:   Intake/Output Summary (Last 24 hours) at 08/30/16 1107 Last data filed at 08/30/16 0929  Gross per 24 hour  Intake              929 ml  Output             1875 ml  Net             -946 ml    PHYSICAL EXAMINATION:   GENERAL: 66 y.o.-year-old patient lying in the bed with no acute distress.  EYES: Pupils equal, round, reactive to light and accommodation. No scleral icterus. Extraocular muscles intact.  HEENT: Head atraumatic, normocephalic. Oropharynx and nasopharynx clear.  NECK: Supple, no jugular venous distention. No thyroid  enlargement, no tenderness.  LUNGS: Normal breath sounds bilaterally, no wheezing, rales,rhonchi or crepitation. No use of accessory muscles of respiration.  CARDIOVASCULAR: S1, S2 normal. No murmurs, rubs, or gallops.  ABDOMEN: Soft, nontender, nondistended. Bowel sounds present. No organomegaly or mass.  EXTREMITIES: 1+pedal edemabilaterally. No cyanosis, or clubbing.  NEUROLOGIC: Cranial nerves II through XII are intact. Muscle strength 5/5 in all extremities. Sensation intact. Gait not checked.  PSYCHIATRIC: The patient is alert and oriented x 3.  SKIN: left leg erythema is much better than initial skin makings.  DATA REVIEW:   CBC  Recent Labs Lab 08/29/16 0441  WBC 5.3  HGB 11.1*  HCT 32.7*  PLT 261    Chemistries   Recent Labs Lab 08/28/16 1112 08/29/16 0441  NA 137 138  K 3.0* 3.6  CL 103 107  CO2 26 24  GLUCOSE 96 110*  BUN 27* 25*  CREATININE 1.83* 1.68*  CALCIUM 9.4 8.7*  AST 26  --   ALT 19  --   ALKPHOS 75  --   BILITOT 0.6  --     Cardiac Enzymes  Recent Labs Lab 08/28/16 1112  TROPONINI <0.03    Microbiology Results  Results for orders placed or performed during the hospital encounter of 08/28/16  Blood culture (routine x 2)     Status: None (Preliminary result)   Collection Time: 08/28/16 11:37 AM  Result Value Ref Range Status   Specimen Description BLOOD LEFT AC  Final   Special Requests   Final    BOTTLES DRAWN AEROBIC AND ANAEROBIC Blood Culture adequate volume   Culture NO GROWTH < 24 HOURS  Final   Report Status PENDING  Incomplete  Blood culture (routine x 2)     Status: None (Preliminary result)   Collection Time: 08/28/16 12:07 PM  Result Value Ref Range Status   Specimen Description BLOOD R AC  Final   Special Requests   Final    BOTTLES DRAWN AEROBIC AND ANAEROBIC Blood Culture adequate volume   Culture NO GROWTH < 24 HOURS  Final   Report Status PENDING  Incomplete  MRSA PCR Screening     Status: None   Collection  Time: 08/29/16 11:49 AM  Result  Value Ref Range Status   MRSA by PCR NEGATIVE NEGATIVE Final    Comment:        The GeneXpert MRSA Assay (FDA approved for NASAL specimens only), is one component of a comprehensive MRSA colonization surveillance program. It is not intended to diagnose MRSA infection nor to guide or monitor treatment for MRSA infections.     RADIOLOGY:  US Venous Img Lower Bilateral  Result Date: 08/28/2016 CLINICAL DATA:  Bilateral lower extremity swelling and erythema. EXAM: BILATERAL LOWER EXTREMITY VENOUS DOPPLER ULTRASOUND TECHNIQUE: Gray-scale sonography with graded compression, as well as color Doppler and duplex ultrasound were performed to evaluate the lower extremity deep venous systems from the level of the common femoral vein and including the common femoral, femoral, profunda femoral, popliteal and calf veins including the posterior tibial, peroneal and gastrocnemius veins when visible. The superficial great saphenous vein was also interrogated. Spectral Doppler was utilized to evaluate flow at rest and with distal augmentation maneuvers in the common femoral, femoral and popliteal veins. COMPARISON:  None. FINDINGS: RIGHT LOWER EXTREMITY Common Femoral Vein: No evidence of thrombus. Normal compressibility, respiratory phasicity and response to augmentation. Saphenofemoral Junction: No evidence of thrombus. Normal compressibility and flow on color Doppler imaging. Profunda Femoral Vein: No evidence of thrombus. Normal compressibility and flow on color Doppler imaging. Femoral Vein: No evidence of thrombus. Normal compressibility, respiratory phasicity and response to augmentation. Popliteal Vein: No evidence of thrombus. Normal compressibility, respiratory phasicity and response to augmentation. Calf Veins: No evidence of thrombus. Normal compressibility and flow on color Doppler imaging. Superficial Great Saphenous Vein: No evidence of thrombus. Normal compressibility  and flow on color Doppler imaging. LEFT LOWER EXTREMITY Common Femoral Vein: No evidence of thrombus. Normal compressibility, respiratory phasicity and response to augmentation. Saphenofemoral Junction: No evidence of thrombus. Normal compressibility and flow on color Doppler imaging. Profunda Femoral Vein: No evidence of thrombus. Normal compressibility and flow on color Doppler imaging. Femoral Vein: No evidence of thrombus. Normal compressibility, respiratory phasicity and response to augmentation. Popliteal Vein: No evidence of thrombus. Normal compressibility, respiratory phasicity and response to augmentation. Calf Veins: No evidence of thrombus. Normal compressibility and flow on color Doppler imaging. Superficial Great Saphenous Vein: No evidence of thrombus. Normal compressibility and flow on color Doppler imaging. Other Findings: Hypoechoic cystic lesion the LEFT groin again noted measuring 7.2 x 5.6 x 3.6 cm IMPRESSION: No evidence of DVT with either lower extremity. A 7 cm cystic lesion in the LEFT groin. Electronically Signed   By: Suzy Bouchard M.D.   On: 08/28/2016 13:21    EKG:   Orders placed or performed during the hospital encounter of 07/25/16  . ED EKG  . ED EKG      Management plans discussed with the patient, family and they are in agreement.  CODE STATUS:     Code Status Orders        Start     Ordered   08/28/16 1159  Full code  Continuous     08/28/16 1159    Code Status History    Date Active Date Inactive Code Status Order ID Comments User Context   12/18/2015  7:56 PM 12/19/2015  7:39 PM Full Code [id number]  Lytle Butte, MD ED      TOTAL TIME TAKING CARE OF THIS PATIENT: 35 minutes.    Vaughan Basta M.D on 08/30/2016 at 11:07 AM  Between 7am to 6pm - Pager - [phone]  After 6pm go to [email or web address] - password [email or web address]  [facility]  Office  [phone]  CC: Primary care physician; Enid Derry, MD   Note: This  dictation was prepared with Dragon dictation along with smaller phrase technology. Any transcriptional errors that result from this process are unintentional.

## 2016-08-30 NOTE — Progress Notes (Signed)
Pharmacy Antibiotic Note  [name] is a 66 y.o. female admitted on 08/28/2016 with cellulitis.  Pharmacy has been consulted for cefazolin dosing.  Plan: Continue Cefazolin 1 g IV q8h  Temp (24hrs), Avg:98 F (36.7 C), Min:97.8 F (36.6 C), Max:98.3 F (36.8 C)   Recent Labs Lab 08/23/16 1630 08/28/16 1112 08/29/16 0441  WBC 8.6 6.2 5.3  CREATININE 1.40* 1.83* 1.68*    Estimated Creatinine Clearance: 45.2 mL/min (A) (by C-G formula based on SCr of 1.68 mg/dL (H)).    No Known Allergies  Antimicrobials this admission: cefazolin 5/2 >>   Dose adjustments this admission:  Microbiology results: 5/2 BCx: Sent 5/2 UCx: Sent   Thank you for allowing pharmacy to be a part of this patient's care.  Larene Beach, PharmD, BCPS Clinical Pharmacist 08/30/2016 12:30 PM

## 2016-08-30 NOTE — Clinical Social Work Note (Signed)
Bed offers extended to patient and she has chosen [facility]. Physician to discharge patient today CSW has notified [facility] and discharge information sent. Patient states she wishes for her husband to take her because she does not want to wait for EMS. Shela Leff MSW,LCSW [phone]

## 2016-08-30 NOTE — Progress Notes (Signed)
Pt to be discharged per Md order. Iv removed. Discharge summary and scripts included on discharge packet. Report called to [facility]. Pt prefers to provide transport

## 2016-08-30 NOTE — Care Management Important Message (Signed)
Important Message  Patient Details  Name: [name] MRN: [id number] Date of Birth: 1951-02-24   Medicare Important Message Given:  N/A - LOS <3 / Initial given by admissions    Beverly Sessions, RN 08/30/2016, 2:02 PM

## 2016-08-30 NOTE — Consult Note (Signed)
   [facility] CM Inpatient Consult   08/30/2016  [name] Mar 10, 1951 [id number]   Patient screened for potential [facility] Management services. Patient is eligible for [place]. Electronic medical record reveals patient's discharge plan is SNF. [facility] Care Management services not appropriate at this time. If patient's post hospital needs change please place a [facility] Care Management consult. For questions please contact:   Mykia Holton RN, [facility] Liaison  [phone]) Business Mobile [phone]) Toll free office

## 2016-09-02 LAB — CULTURE, BLOOD (ROUTINE X 2)
Culture: NO GROWTH
Culture: NO GROWTH
Special Requests: ADEQUATE
Special Requests: ADEQUATE

## 2016-09-03 DIAGNOSIS — I739 Peripheral vascular disease, unspecified: Secondary | ICD-10-CM | POA: Diagnosis not present

## 2016-09-03 DIAGNOSIS — L03116 Cellulitis of left lower limb: Secondary | ICD-10-CM | POA: Diagnosis not present

## 2016-09-03 DIAGNOSIS — R339 Retention of urine, unspecified: Secondary | ICD-10-CM | POA: Diagnosis not present

## 2016-09-03 DIAGNOSIS — M6281 Muscle weakness (generalized): Secondary | ICD-10-CM | POA: Diagnosis not present

## 2016-09-03 DIAGNOSIS — G35 Multiple sclerosis: Secondary | ICD-10-CM | POA: Diagnosis not present

## 2016-09-05 ENCOUNTER — Inpatient Hospital Stay: Payer: Medicare Other | Attending: Oncology | Admitting: Oncology

## 2016-09-05 ENCOUNTER — Inpatient Hospital Stay: Payer: Medicare Other | Attending: Oncology

## 2016-09-05 NOTE — Progress Notes (Deleted)
Hematology/Oncology Consult note [facility]  [phone] [phone]  Patient Care Team: Arnetha Courser, MD as PCP - General (Family Medicine) Sanda Klein, Satira Anis, MD as Attending Physician (Family Medicine) Christene Lye, MD (General Surgery)   Name of the patient: [name]  [id number]  12/04/1950   Date of visit: 09/05/16  Diagnosis- Stage II low grade follicular lymphoma  Chief complaint/ Reason for visit- on treatment assessment prior to cycle 4 of weekly rituxan  Heme/Onc history: 1. patient is a 66 year old female who has had ongoing problems with pain in her left groin as well as his left leg swelling.   2. She had a Doppler of her left lower extremity on 02/28/2016 which showed: 1. Mild to moderate edema within the left calf. No evidence for acute DVT from left groin to popliteal fossa. Slightly limited evaluation of calf veins due to edema. 2. Left inguinal enlarged lymph nodes up to 6.2 cm, findings could be secondary to reactive node, infection, inflammation, or lymphoproliferative disease.  3. She had a repeat Doppler on 05/24/2016 which showed: IMPRESSION: 1. No evidence of lower extremity deep vein thrombosis, left. 2. Left inguinal lymphadenopathy. Differential diagnosis: Reactive adenopathy, lymphoma, metastatic disease.  4. CT abdomen on 06/14/2016 showed:   Vascular/Lymphatic: Aortic calcifications are noted without aneurysmal dilatation. Considerable lymphadenopathy is noted in the left inguinal region. The largest of these measures 6.1 by 3.0 cm in greatest dimension. This is similar to that seen on the prior ultrasound examination and remains suspicious. Additionally a lymphomatous mass is noted within the left hemipelvis which measures approximately 10 by 4.5 cm. Scattered left iliac and periaortic lymph nodes are seen. The periaortic lymph nodes are not significantly enlarged. The other  left iliac chain nodes measure upwards of 18 mm.  IMPRESSION: Lymphadenopathy within the left inguinal region and the left iliac chain. This is highly suspicious for underlying neoplasm. Biopsy is recommended for further evaluation.  5. Patient was seen by Dr. Jamal Collin from surgery and underwent excisional biopsy of the leftinguinal lymph node under ultrasound on 07/04/2016. Final pathology not reported yet. Dr. Luana Shu told me over the phone that this appears to be CD 20+ B cell lymphoma  6. Patient has multiple sclerosis and has h/o depression and anxiety. She was given clonopin this morning and currently patient is drowsy but able to be aroused. She drifts off to sleep in between conversation. She is here with her friend who helps her out. Patient lives with her husband at baseline and uses a walker to ambulate. Reports pain and discomfort in her left groin. Denies fevers, chills, night sweats or weight loss  7. PET/CT scan from 07/16/16 showed: IMPRESSION: 1. Pathologic and hypermetabolic lower retroperitoneal, pelvic, and inguinal adenopathy compatible with Deauville 5 malignancy. 2. Mildly prominent right lower paratracheal lymph node, Deauville 4. 3. Accentuated activity in the right lateral pterygoid muscle without CT correlate, probably incidental, merits attention on any follow up. 4. Other imaging findings of potential clinical significance: Airway thickening is present, suggesting bronchitis or reactive airways disease. Trace left pleural effusion. Coronary and aortoiliac atherosclerosis. Prominent stool throughout the colon favors constipation. Late phase healing fractures of left lower anterior ribs.  8. Bone marrow biopsy from 07/18/2016 did not show any evidence of lymphoma. HIV and hepatitis B and hepatitis C testing was negative    Interval history- ***  ECOG PS- 3 Pain scale- *** Opioid associated constipation- ***  Review of systems- Review of Systems  Constitutional: Negative for chills, fever, malaise/fatigue and weight loss.  HENT: Negative for congestion, ear discharge and nosebleeds.   Eyes: Negative for blurred vision.  Respiratory: Negative for cough, hemoptysis, sputum production, shortness of breath and wheezing.   Cardiovascular: Negative for chest pain, palpitations, orthopnea and claudication.  Gastrointestinal: Negative for abdominal pain, blood in stool, constipation, diarrhea, heartburn, melena, nausea and vomiting.  Genitourinary: Negative for dysuria, flank pain, frequency, hematuria and urgency.  Musculoskeletal: Negative for back pain, joint pain and myalgias.  Skin: Negative for rash.  Neurological: Negative for dizziness, tingling, focal weakness, seizures, weakness and headaches.  Endo/Heme/Allergies: Does not bruise/bleed easily.  Psychiatric/Behavioral: Negative for depression and suicidal ideas. The patient does not have insomnia.      Current treatment- 3/4 weekly rituxan  No Known Allergies   Past Medical History:  Diagnosis Date  . ADHD   . Anxiety   . COPD (chronic obstructive pulmonary disease) ([place])   . Depression    major depressive  . Dyspnea    doe  . Edema    left leg  . Follicular lymphoma ([place])    B Cell  . Hypertension   . Hypotension    idiopathic  . Kyphoscoliosis and scoliosis 11/26/2011  . Morbid obesity ([place]) 01/05/2016  . Multiple sclerosis ([place])   . Multiple sclerosis ([place])    1980's  . Neuromuscular disorder ([place])   . Obstructive and reflux uropathy    foley  . Pain    atypical facial  . Peripheral vascular disease of lower extremity with ulceration ([place]) 11/08/2015  . Skin ulcer ([place]) 11/08/2015  . Weakness    generalized. has MS     Past Surgical History:  Procedure Laterality Date  . BACK SURGERY N/A 2002  . CYST EXCISION     lower back  . INGUINAL LYMPH NODE BIOPSY Left 07/04/2016   Procedure: INGUINAL LYMPH NODE BIOPSY;  Surgeon: Christene Lye, MD;   Location: [facility] ORS;  Service: General;  Laterality: Left;  . PORTACATH PLACEMENT N/A 07/22/2016   Procedure: INSERTION PORT-A-CATH;  Surgeon: Christene Lye, MD;  Location: [facility] ORS;  Service: General;  Laterality: N/A;  . TONSILLECTOMY AND ADENOIDECTOMY    . TUBAL LIGATION      Social History   Social History  . Marital status: Married    Spouse name: N/A  . Number of children: 3  . Years of education: N/A   Occupational History  . disabled    Social History Main Topics  . Smoking status: Former Smoker    Packs/day: 1.00    Years: 20.00    Types: Cigarettes    Quit date: 02/03/2016  . Smokeless tobacco: Never Used  . Alcohol use No  . Drug use: Yes    Types: Marijuana     Comment: smokes THC daily per pt   . Sexual activity: No   Other Topics Concern  . Not on file   Social History Narrative  . No narrative on file    Family History  Problem Relation Age of Onset  . COPD Mother   . Diabetes Mother   . Heart failure Mother   . Alcohol abuse Father   . Kidney disease Father   . Kidney failure Father   . Arthritis Sister   . CAD Maternal Grandmother   . Stroke Maternal Grandfather   . Diabetes Sister   . Arthritis Sister   . Arthritis Brother      Current Outpatient Prescriptions:  .  amoxicillin-clavulanate (AUGMENTIN) 875-125 MG tablet, Take 1 tablet by mouth 2 (two) times daily., Disp: 14 tablet, Rfl: 0 .  AVONEX PEN 30 MCG/0.5ML AJKT, Inject 30 mcg into the skin every Monday., Disp: , Rfl:  .  buPROPion (WELLBUTRIN XL) 300 MG 24 hr tablet, Take 300 mg by mouth every morning. , Disp: , Rfl:  .  clonazePAM (KLONOPIN) 1 MG tablet, Take 1 tablet by mouth at bedtime. , Disp: , Rfl:  .  DULoxetine (CYMBALTA) 60 MG capsule, Take 60 mg by mouth every morning. , Disp: , Rfl:  .  fluticasone furoate-vilanterol (BREO ELLIPTA) 100-25 MCG/INH AEPB, Inhale 1 puff into the lungs daily., Disp: 1 each, Rfl: 11 .  gabapentin (NEURONTIN) 600 MG tablet, TAKE 1 TABLET  (600 MG TOTAL) BY MOUTH 3 (THREE) TIMES DAILY., Disp: 90 tablet, Rfl: 2 .  hydrochlorothiazide (MICROZIDE) 12.5 MG capsule, TAKE ONE CAPSULE (12.5 MG TOTAL) BY MOUTH EVERY DAY (THIS REPLACES TRIAMTERENE/HCTZ), Disp: 30 capsule, Rfl: 5 .  lidocaine-prilocaine (EMLA) cream, Apply 1 application topically as needed. apply cream over the port site and then place small piece of saran wrap over the cream to protect your clothing, Disp: 30 g, Rfl: 1 .  methylphenidate (RITALIN) 10 MG tablet, Take 10 mg by mouth 2 (two) times daily. , Disp: , Rfl: 0 .  Multiple Vitamin (MULTIVITAMIN WITH MINERALS) TABS tablet, Take 1 tablet by mouth daily., Disp: , Rfl:  .  oxyCODONE (OXY IR/ROXICODONE) 5 MG immediate release tablet, Take 1 tablet (5 mg total) by mouth every 6 (six) hours as needed for moderate pain or severe pain., Disp: 20 tablet, Rfl: 0 .  polyethylene glycol powder (GLYCOLAX/MIRALAX) powder, Take 17 g by mouth daily. Mixed in water (Patient taking differently: Take 17 g by mouth as needed. Mixed in water), Disp: 850 g, Rfl: 2 .  potassium chloride (K-DUR) 10 MEQ tablet, Take 1 tablet (10 mEq total) by mouth daily., Disp: 90 tablet, Rfl: 1 .  QUEtiapine Fumarate (SEROQUEL XR) 150 MG 24 hr tablet, 150 mg at bedtime. , Disp: , Rfl: 4 .  solifenacin (VESICARE) 5 MG tablet, Take 5 mg by mouth daily., Disp: , Rfl:  .  tiZANidine (ZANAFLEX) 4 MG tablet, Take 4 mg by mouth 4 (four) times daily. TAKES FOR MS, Disp: , Rfl:  .  topiramate (TOPAMAX) 50 MG tablet, TAKE 1 TABLET (50 MG TOTAL) BY MOUTH 2 (TWO) TIMES DAILY. (Patient taking differently: Take 50 mg by mouth daily. ), Disp: 60 tablet, Rfl: 2  Physical exam: There were no vitals filed for this visit. Physical Exam  Constitutional: She is oriented to person, place, and time and well-developed, well-nourished, and in no distress.  HENT:  Head: Normocephalic and atraumatic.  Eyes: EOM are normal. Pupils are equal, round, and reactive to light.  Neck: Normal  range of motion.  Cardiovascular: Normal rate, regular rhythm and normal heart sounds.   Pulmonary/Chest: Effort normal and breath sounds normal.  Abdominal: Soft. Bowel sounds are normal.  Neurological: She is alert and oriented to person, place, and time.  Skin: Skin is warm and dry.     CMP Latest Ref Rng & Units 08/29/2016  Glucose 65 - 99 mg/dL 110(H)  BUN 6 - 20 mg/dL 25(H)  Creatinine 0.44 - 1.00 mg/dL 1.68(H)  Sodium 135 - 145 mmol/L 138  Potassium 3.5 - 5.1 mmol/L 3.6  Chloride 101 - 111 mmol/L 107  CO2 22 - 32 mmol/L 24  Calcium 8.9 - 10.3 mg/dL 8.7(L)  Total Protein 6.5 - 8.1 g/dL -  Total Bilirubin 0.3 - 1.2 mg/dL -  Alkaline Phos 38 - 126 U/L -  AST 15 - 41 U/L -  ALT 14 - 54 U/L -   CBC Latest Ref Rng & Units 08/29/2016  WBC 3.6 - 11.0 K/uL 5.3  Hemoglobin 12.0 - 16.0 g/dL 11.1(L)  Hematocrit 35.0 - 47.0 % 32.7(L)  Platelets 150 - 440 K/uL 261    No images are attached to the encounter.  US Venous Img Lower Bilateral  Result Date: 08/28/2016 CLINICAL DATA:  Bilateral lower extremity swelling and erythema. EXAM: BILATERAL LOWER EXTREMITY VENOUS DOPPLER ULTRASOUND TECHNIQUE: Gray-scale sonography with graded compression, as well as color Doppler and duplex ultrasound were performed to evaluate the lower extremity deep venous systems from the level of the common femoral vein and including the common femoral, femoral, profunda femoral, popliteal and calf veins including the posterior tibial, peroneal and gastrocnemius veins when visible. The superficial great saphenous vein was also interrogated. Spectral Doppler was utilized to evaluate flow at rest and with distal augmentation maneuvers in the common femoral, femoral and popliteal veins. COMPARISON:  None. FINDINGS: RIGHT LOWER EXTREMITY Common Femoral Vein: No evidence of thrombus. Normal compressibility, respiratory phasicity and response to augmentation. Saphenofemoral Junction: No evidence of thrombus. Normal  compressibility and flow on color Doppler imaging. Profunda Femoral Vein: No evidence of thrombus. Normal compressibility and flow on color Doppler imaging. Femoral Vein: No evidence of thrombus. Normal compressibility, respiratory phasicity and response to augmentation. Popliteal Vein: No evidence of thrombus. Normal compressibility, respiratory phasicity and response to augmentation. Calf Veins: No evidence of thrombus. Normal compressibility and flow on color Doppler imaging. Superficial Great Saphenous Vein: No evidence of thrombus. Normal compressibility and flow on color Doppler imaging. LEFT LOWER EXTREMITY Common Femoral Vein: No evidence of thrombus. Normal compressibility, respiratory phasicity and response to augmentation. Saphenofemoral Junction: No evidence of thrombus. Normal compressibility and flow on color Doppler imaging. Profunda Femoral Vein: No evidence of thrombus. Normal compressibility and flow on color Doppler imaging. Femoral Vein: No evidence of thrombus. Normal compressibility, respiratory phasicity and response to augmentation. Popliteal Vein: No evidence of thrombus. Normal compressibility, respiratory phasicity and response to augmentation. Calf Veins: No evidence of thrombus. Normal compressibility and flow on color Doppler imaging. Superficial Great Saphenous Vein: No evidence of thrombus. Normal compressibility and flow on color Doppler imaging. Other Findings: Hypoechoic cystic lesion the LEFT groin again noted measuring 7.2 x 5.6 x 3.6 cm IMPRESSION: No evidence of DVT with either lower extremity. A 7 cm cystic lesion in the LEFT groin. Electronically Signed   By: Suzy Bouchard M.D.   On: 08/28/2016 13:21   US Venous Img Lower Unilateral Left  Result Date: 08/23/2016 CLINICAL DATA:  Swelling, warmth, and erythema. EXAM: LEFT LOWER EXTREMITY VENOUS DOPPLER ULTRASOUND TECHNIQUE: Gray-scale sonography with graded compression, as well as color Doppler and duplex ultrasound were  performed to evaluate the lower extremity deep venous systems from the level of the common femoral vein and including the common femoral, femoral, profunda femoral, popliteal and calf veins including the posterior tibial, peroneal and gastrocnemius veins when visible. The superficial great saphenous vein was also interrogated. Spectral Doppler was utilized to evaluate flow at rest and with distal augmentation maneuvers in the common femoral, femoral and popliteal veins. COMPARISON:  None. FINDINGS: Contralateral Common Femoral Vein: Respiratory phasicity is normal and symmetric with the symptomatic side. No evidence of thrombus. Normal compressibility. Common Femoral Vein: No evidence of thrombus.  Normal compressibility, respiratory phasicity and response to augmentation. Saphenofemoral Junction: No evidence of thrombus. Normal compressibility and flow on color Doppler imaging. Profunda Femoral Vein: No evidence of thrombus. Normal compressibility and flow on color Doppler imaging. Femoral Vein: No evidence of thrombus. Normal compressibility, respiratory phasicity and response to augmentation. Popliteal Vein: No evidence of thrombus. Normal compressibility, respiratory phasicity and response to augmentation. Calf Veins: The posterior tibial in peroneal veins are not well visualized. Superficial Great Saphenous Vein: No evidence of thrombus. Normal compressibility and flow on color Doppler imaging. Venous Reflux:  None. Other Findings: Abnormal hypoechoic mass in the left groin measuring up to a 4.6 x 6.3 x 3.8 cm. Given the recently diagnosed lymphoma, I suspect this is an abnormal lymph node rather than a cystic mass. IMPRESSION: No evidence a DVT. Abnormal mass in the left groin suspected to be an abnormal lymph node, given recent diagnosis of lymphoma, rather than a cystic mass. Electronically Signed   By: Dorise Bullion III M.D   On: 08/23/2016 19:38     Assessment and plan- Patient is a 66 y.o. female  with h/o Stage II follicular lymphoma involving intra abdominal LN mainly inguinal LN s/p 3 weekly doses of rituxan  Counts ok to proceed with cycle 4 of weekly rituxan today. Repeat CT abdomen in 2 weeks refer her to rad Onc if she has persistent adenopathy given LLE swelling       Visit Diagnosis No diagnosis found.   Dr. Randa Evens, MD, MPH [place] [facility] Pager- [phone] 09/05/2016 8:44 AM

## 2016-09-06 ENCOUNTER — Ambulatory Visit: Payer: Medicare Other | Admitting: Family Medicine

## 2016-09-10 DIAGNOSIS — C8293 Follicular lymphoma, unspecified, intra-abdominal lymph nodes: Secondary | ICD-10-CM | POA: Diagnosis not present

## 2016-09-10 DIAGNOSIS — L03116 Cellulitis of left lower limb: Secondary | ICD-10-CM | POA: Diagnosis not present

## 2016-09-10 DIAGNOSIS — M6281 Muscle weakness (generalized): Secondary | ICD-10-CM | POA: Diagnosis not present

## 2016-09-10 DIAGNOSIS — B029 Zoster without complications: Secondary | ICD-10-CM | POA: Diagnosis not present

## 2016-09-10 DIAGNOSIS — G35 Multiple sclerosis: Secondary | ICD-10-CM | POA: Diagnosis not present

## 2016-09-12 ENCOUNTER — Other Ambulatory Visit: Payer: Self-pay | Admitting: *Deleted

## 2016-09-12 DIAGNOSIS — J441 Chronic obstructive pulmonary disease with (acute) exacerbation: Secondary | ICD-10-CM

## 2016-09-12 NOTE — Patient Outreach (Signed)
[place] [facility]) Care Management  09/12/2016  [name] 07-17-1950 [id number]   Met with patient at bedside of facility.  Patient reports she is unsure how long she will be at facility, her spouse is redoing their ramp and putting in some hand rails while she is there.   Patient reports she has history of MS since the 1980's, lymphoma, COPD, cellulitis,long term foley catheter placement and most recently a case of shingles (patient placed on isolation). Patient states she lives with her spouse, she was living with a son and his wife but they "threw me into the brier patch" asking RNCM if she had heard of "aesop's fables". Stating they breached their promise to care for her after she moved in with "all my stuff".  Patient states she had Medicaid in the past but lost it somehow. She states her spouse wants her to get back to walking, she does not feel she can get back to walking due to all that is wrong with her, citing multiple comorbid conditions.   Patient reports she has medication for MS that is very expensive cites avonex and states it is $1000/week and she has not been able to take it.    RNCM reviewed [facility] care management program, patient agrees to participate, verbalizing understanding of getting transition of care calls and/or visits. Consent obtained.  Patient requests nurse for transition of care, SW to review Medicaid and pharmacy referral for medication management and possible assistance with high cost medications.  Plan to let SW know that patient has signed up with [facility] and requested she let RNCM know when discharge date planned. Therapy has not set a date for [facility].  Plan to place referral for [facility] care management Community RN, LCSW and Pharmacist.  [name]. [name], RN, BSN, [place] [phone]) Business Cell  [phone]) Toll Free Office

## 2016-09-19 ENCOUNTER — Other Ambulatory Visit: Payer: Self-pay | Admitting: *Deleted

## 2016-09-19 NOTE — Patient Outreach (Signed)
[place] [facility]) Care Management  09/19/2016  LEISHA TRINKLE [birth year] [id number]   Notified by Donnalee Curry, SW at facility, patient to discharge home 09/20/16 with home care.   Plan to let [facility] care management team know of planned discharge and this RNCM will sign off. [name]. [name], RN, BSN, [place] [phone]) Business Cell  [phone]) Toll Free Office

## 2016-09-20 ENCOUNTER — Other Ambulatory Visit: Payer: Self-pay | Admitting: Pharmacist

## 2016-09-20 ENCOUNTER — Ambulatory Visit: Payer: Self-pay | Admitting: Pharmacist

## 2016-09-20 NOTE — Patient Outreach (Addendum)
[place] [facility]) Care Management  09/20/2016  [name] 10/07/1950 [id number]  66 year old female with MS, lymphoma, COPD, depression, peripheral vascular disease, OSA, shingles, and chronic ulcers and cellulitis in right and left legs with recent hospitalization for cellulitis of left leg discharged to SNF with plan for patient to discharge home today.  [facility] pharmacy referred to medication cost assistance with MS medications.   Successful outreach phone call to patient today. HIPAA identifiers verified.  Patient stated that she has been taking her weekly Avonex injection every other week instead of weekly due to financial difficulties and that her last dose was last week.  She does not currently have any more Avonex pens.  Patient also states she stopped her Ampyra several months ago because she could not get it refilled.  Patient states she had been receiving her Avonex injections by mail order and that it will cost more than $1000 to fill it which she cannot afford right now.  Patient unsure of what mail order pharmacy she was using.    A review of patient's chart shows 3 prescription fills of Avonex in 2017 through [facility], a mail order service for [facility]. I spoke with an [employer] representative who verified patient had $0 co-pay for last 3 month supply fill on 04/28/2016.    Estimated 3 month supply co-pay for 2018 per representative is ~$2000.  Patient is currently in initial coverage phase.  When she meets her total out of pocket cost of $5000, she will move into the catastrophic phase in which her co-pays will be $8.35 / brand and $3.35 / generic.    Patient not eligible for patient assistance through [place] drug company because she has Medicare.  [employer] for MS is not accepting any further claims at this time.    Placed phone-call to patient's neurologist, Dr. Manuella Ghazi, at the [facility] in [place], [place].  Left message with staff to update them on patient's current status  with financial difficulties.  Awaiting call back from office.    Plan:  Awaiting return phone-call from patient's neurologist. Will route note to office as well.  Will call patient next week to review 2018 [employer] coverage and any updates from neurologist.    Ralene Bathe, PharmD, [place] [phone]

## 2016-09-21 DIAGNOSIS — I2729 Other secondary pulmonary hypertension: Secondary | ICD-10-CM | POA: Diagnosis not present

## 2016-09-21 DIAGNOSIS — G8911 Acute pain due to trauma: Secondary | ICD-10-CM | POA: Diagnosis not present

## 2016-09-21 DIAGNOSIS — I950 Idiopathic hypotension: Secondary | ICD-10-CM | POA: Diagnosis not present

## 2016-09-21 DIAGNOSIS — G501 Atypical facial pain: Secondary | ICD-10-CM | POA: Diagnosis not present

## 2016-09-21 DIAGNOSIS — G4733 Obstructive sleep apnea (adult) (pediatric): Secondary | ICD-10-CM | POA: Diagnosis not present

## 2016-09-21 DIAGNOSIS — I10 Essential (primary) hypertension: Secondary | ICD-10-CM | POA: Diagnosis not present

## 2016-09-21 DIAGNOSIS — E559 Vitamin D deficiency, unspecified: Secondary | ICD-10-CM | POA: Diagnosis not present

## 2016-09-21 DIAGNOSIS — J449 Chronic obstructive pulmonary disease, unspecified: Secondary | ICD-10-CM | POA: Diagnosis not present

## 2016-09-21 DIAGNOSIS — G43909 Migraine, unspecified, not intractable, without status migrainosus: Secondary | ICD-10-CM | POA: Diagnosis not present

## 2016-09-21 DIAGNOSIS — M5416 Radiculopathy, lumbar region: Secondary | ICD-10-CM | POA: Diagnosis not present

## 2016-09-21 DIAGNOSIS — Z9181 History of falling: Secondary | ICD-10-CM | POA: Diagnosis not present

## 2016-09-21 DIAGNOSIS — I739 Peripheral vascular disease, unspecified: Secondary | ICD-10-CM | POA: Diagnosis not present

## 2016-09-21 DIAGNOSIS — G629 Polyneuropathy, unspecified: Secondary | ICD-10-CM | POA: Diagnosis not present

## 2016-09-21 DIAGNOSIS — G35 Multiple sclerosis: Secondary | ICD-10-CM | POA: Diagnosis not present

## 2016-09-21 DIAGNOSIS — M1990 Unspecified osteoarthritis, unspecified site: Secondary | ICD-10-CM | POA: Diagnosis not present

## 2016-09-25 ENCOUNTER — Ambulatory Visit: Payer: Self-pay | Admitting: Pharmacist

## 2016-09-25 ENCOUNTER — Other Ambulatory Visit: Payer: Self-pay | Admitting: Pharmacist

## 2016-09-25 NOTE — Progress Notes (Deleted)
Hematology/Oncology Consult note [facility]  [phone] [phone]  Patient Care Team: Arnetha Courser, MD as PCP - General (Family Medicine) Sanda Klein, Satira Anis, MD as Attending Physician (Family Medicine) Christene Lye, MD (General Surgery) Lyman Speller, RN as [place], [place], [place] as [place], [facility] as [place] (Pharmacist)   Name of the patient: [name]  [id number]  1951-04-07   Date of visit: 09/25/16  Diagnosis- Stage II low grade follicular lymphoma  Chief complaint/ Reason for visit- on treatment assessment prior to cycle 3 of weekly rituxan  Heme/Onc history: 1. patient is a 66 year old female who has had ongoing problems with pain in her left groin as well as his left leg swelling.   2. She had a Doppler of her left lower extremity on 02/28/2016 which showed: 1. Mild to moderate edema within the left calf. No evidence for acute DVT from left groin to popliteal fossa. Slightly limited evaluation of calf veins due to edema. 2. Left inguinal enlarged lymph nodes up to 6.2 cm, findings could be secondary to reactive node, infection, inflammation, or lymphoproliferative disease.  3. She had a repeat Doppler on 05/24/2016 which showed: IMPRESSION: 1. No evidence of lower extremity deep vein thrombosis, left. 2. Left inguinal lymphadenopathy. Differential diagnosis: Reactive adenopathy, lymphoma, metastatic disease.  4. CT abdomen on 06/14/2016 showed:   Vascular/Lymphatic: Aortic calcifications are noted without aneurysmal dilatation. Considerable lymphadenopathy is noted in the left inguinal region. The largest of these measures 6.1 by 3.0 cm in greatest dimension. This is similar to that seen on the prior ultrasound examination and remains suspicious. Additionally a lymphomatous  mass is noted within the left hemipelvis which measures approximately 10 by 4.5 cm. Scattered left iliac and periaortic lymph nodes are seen. The periaortic lymph nodes are not significantly enlarged. The other left iliac chain nodes measure upwards of 18 mm.   IMPRESSION: Lymphadenopathy within the left inguinal region and the left iliac chain. This is highly suspicious for underlying neoplasm. Biopsy is recommended for further evaluation.  5. Patient was seen by Dr. Jamal Collin from surgery and underwent excisional biopsy of the leftinguinal lymph node under ultrasound on 07/04/2016. Final pathology not reported yet. Dr. Luana Shu told me over the phone that this appears to be CD 20+ B cell lymphoma  6. Patient has multiple sclerosis and has h/o depression and anxiety. She was given clonopin this morning and currently patient is drowsy but able to be aroused. She drifts off to sleep in between conversation. She is here with her friend who helps her out. Patient lives with her husband at baseline and uses a walker to ambulate. Reports pain and discomfort in her left groin. Denies fevers, chills, night sweats or weight loss  7. PET/CT scan from 07/16/16 showed: IMPRESSION: 1. Pathologic and hypermetabolic lower retroperitoneal, pelvic, and inguinal adenopathy compatible with [place]au[place] 5 malignancy. 2. Mildly prominent right lower paratracheal lymph node, [place] 4. 3. Accentuated activity in the right lateral pterygoid muscle without CT correlate, probably incidental, merits attention on any follow up. 4. Other imaging findings of potential clinical significance: Airway thickening is present, suggesting bronchitis or reactive airways disease. Trace left pleural effusion. Coronary and aortoiliac atherosclerosis. Prominent stool throughout the colon favors constipation. Late phase healing fractures of left lower anterior ribs.  8. Bone marrow biopsy from 07/18/2016 did not show any  evidence of lymphoma. HIV  and hepatitis B and hepatitis C testing was negative  9. Patient has received 2 weekly doses of rituxan so far. Patient then developed shingles and patient was in rehab and getting treated for the same. Dose 3 of rituxan therefore delayed  Interval history- ***  ECOG PS- *** Pain scale- *** Opioid associated constipation- ***  Review of systems- Review of Systems  Constitutional: Negative for chills, fever, malaise/fatigue and weight loss.  HENT: Negative for congestion, ear discharge and nosebleeds.   Eyes: Negative for blurred vision.  Respiratory: Negative for cough, hemoptysis, sputum production, shortness of breath and wheezing.   Cardiovascular: Negative for chest pain, palpitations, orthopnea and claudication.  Gastrointestinal: Negative for abdominal pain, blood in stool, constipation, diarrhea, heartburn, melena, nausea and vomiting.  Genitourinary: Negative for dysuria, flank pain, frequency, hematuria and urgency.  Musculoskeletal: Negative for back pain, joint pain and myalgias.  Skin: Negative for rash.  Neurological: Negative for dizziness, tingling, focal weakness, seizures, weakness and headaches.  Endo/Heme/Allergies: Does not bruise/bleed easily.  Psychiatric/Behavioral: Negative for depression and suicidal ideas. The patient does not have insomnia.        No Known Allergies   Past Medical History:  Diagnosis Date  . ADHD   . Anxiety   . COPD (chronic obstructive pulmonary disease) ([place])   . Depression    major depressive  . Dyspnea    doe  . Edema    left leg  . Follicular lymphoma ([place])    B Cell  . Hypertension   . Hypotension    idiopathic  . Kyphoscoliosis and scoliosis 11/26/2011  . Morbid obesity ([place]) 01/05/2016  . Multiple sclerosis ([place])   . Multiple sclerosis ([place])    1980's  . Neuromuscular disorder ([place])   . Obstructive and reflux uropathy    foley  . Pain    atypical facial  . Peripheral vascular disease  of lower extremity with ulceration ([place]) 11/08/2015  . Skin ulcer ([place]) 11/08/2015  . Weakness    generalized. has MS     Past Surgical History:  Procedure Laterality Date  . BACK SURGERY N/A 2002  . CYST EXCISION     lower back  . INGUINAL LYMPH NODE BIOPSY Left 07/04/2016   Procedure: INGUINAL LYMPH NODE BIOPSY;  Surgeon: Christene Lye, MD;  Location: [facility] ORS;  Service: General;  Laterality: Left;  . PORTACATH PLACEMENT N/A 07/22/2016   Procedure: INSERTION PORT-A-CATH;  Surgeon: Christene Lye, MD;  Location: [facility] ORS;  Service: General;  Laterality: N/A;  . TONSILLECTOMY AND ADENOIDECTOMY    . TUBAL LIGATION      Social History   Social History  . Marital status: Married    Spouse name: N/A  . Number of children: 3  . Years of education: N/A   Occupational History  . disabled    Social History Main Topics  . Smoking status: Former Smoker    Packs/day: 1.00    Years: 20.00    Types: Cigarettes    Quit date: 02/03/2016  . Smokeless tobacco: Never Used  . Alcohol use No  . Drug use: Yes    Types: Marijuana     Comment: smokes THC daily per pt   . Sexual activity: No   Other Topics Concern  . Not on file   Social History Narrative  . No narrative on file    Family History  Problem Relation Age of Onset  . COPD Mother   . Diabetes Mother   . Heart failure  Mother   . Alcohol abuse Father   . Kidney disease Father   . Kidney failure Father   . Arthritis Sister   . CAD Maternal Grandmother   . Stroke Maternal Grandfather   . Diabetes Sister   . Arthritis Sister   . Arthritis Brother      Current Outpatient Prescriptions:  .  AVONEX PEN 30 MCG/0.5ML AJKT, Inject 30 mcg into the skin every Monday., Disp: , Rfl:  .  buPROPion (WELLBUTRIN XL) 300 MG 24 hr tablet, Take 300 mg by mouth every morning. , Disp: , Rfl:  .  clonazePAM (KLONOPIN) 1 MG tablet, Take 1 tablet by mouth at bedtime. , Disp: , Rfl:  .  DULoxetine (CYMBALTA) 60 MG capsule,  Take 60 mg by mouth every morning. , Disp: , Rfl:  .  fluticasone furoate-vilanterol (BREO ELLIPTA) 100-25 MCG/INH AEPB, Inhale 1 puff into the lungs daily., Disp: 1 each, Rfl: 11 .  gabapentin (NEURONTIN) 600 MG tablet, TAKE 1 TABLET (600 MG TOTAL) BY MOUTH 3 (THREE) TIMES DAILY., Disp: 90 tablet, Rfl: 2 .  hydrochlorothiazide (MICROZIDE) 12.5 MG capsule, TAKE ONE CAPSULE (12.5 MG TOTAL) BY MOUTH EVERY DAY (THIS REPLACES TRIAMTERENE/HCTZ), Disp: 30 capsule, Rfl: 5 .  lidocaine-prilocaine (EMLA) cream, Apply 1 application topically as needed. apply cream over the port site and then place small piece of saran wrap over the cream to protect your clothing, Disp: 30 g, Rfl: 1 .  methylphenidate (RITALIN) 10 MG tablet, Take 10 mg by mouth 2 (two) times daily. , Disp: , Rfl: 0 .  Multiple Vitamin (MULTIVITAMIN WITH MINERALS) TABS tablet, Take 1 tablet by mouth daily., Disp: , Rfl:  .  oxyCODONE (OXY IR/ROXICODONE) 5 MG immediate release tablet, Take 1 tablet (5 mg total) by mouth every 6 (six) hours as needed for moderate pain or severe pain., Disp: 20 tablet, Rfl: 0 .  polyethylene glycol powder (GLYCOLAX/MIRALAX) powder, Take 17 g by mouth daily. Mixed in water (Patient taking differently: Take 17 g by mouth as needed. Mixed in water), Disp: 850 g, Rfl: 2 .  potassium chloride (K-DUR) 10 MEQ tablet, Take 1 tablet (10 mEq total) by mouth daily., Disp: 90 tablet, Rfl: 1 .  QUEtiapine Fumarate (SEROQUEL XR) 150 MG 24 hr tablet, 150 mg at bedtime. , Disp: , Rfl: 4 .  solifenacin (VESICARE) 5 MG tablet, Take 5 mg by mouth daily., Disp: , Rfl:  .  tiZANidine (ZANAFLEX) 4 MG tablet, Take 4 mg by mouth 4 (four) times daily. TAKES FOR MS, Disp: , Rfl:  .  topiramate (TOPAMAX) 50 MG tablet, TAKE 1 TABLET (50 MG TOTAL) BY MOUTH 2 (TWO) TIMES DAILY. (Patient taking differently: Take 50 mg by mouth daily. ), Disp: 60 tablet, Rfl: 2  Physical exam: There were no vitals filed for this visit. Physical Exam    Constitutional: She is oriented to person, place, and time and well-developed, well-nourished, and in no distress.  HENT:  Head: Normocephalic and atraumatic.  Eyes: EOM are normal. Pupils are equal, round, and reactive to light.  Neck: Normal range of motion.  Cardiovascular: Normal rate, regular rhythm and normal heart sounds.   Pulmonary/Chest: Effort normal and breath sounds normal.  Abdominal: Soft. Bowel sounds are normal.  Neurological: She is alert and oriented to person, place, and time.  Skin: Skin is warm and dry.     CMP Latest Ref Rng & Units 08/29/2016  Glucose 65 - 99 mg/dL 110(H)  BUN 6 - 20 mg/dL 25(H)  Creatinine 0.44 - 1.00 mg/dL 1.68(H)  Sodium 135 - 145 mmol/L 138  Potassium 3.5 - 5.1 mmol/L 3.6  Chloride 101 - 111 mmol/L 107  CO2 22 - 32 mmol/L 24  Calcium 8.9 - 10.3 mg/dL 8.7(L)  Total Protein 6.5 - 8.1 g/dL -  Total Bilirubin 0.3 - 1.2 mg/dL -  Alkaline Phos 38 - 126 U/L -  AST 15 - 41 U/L -  ALT 14 - 54 U/L -   CBC Latest Ref Rng & Units 08/29/2016  WBC 3.6 - 11.0 K/uL 5.3  Hemoglobin 12.0 - 16.0 g/dL 11.1(L)  Hematocrit 35.0 - 47.0 % 32.7(L)  Platelets 150 - 440 K/uL 261    No images are attached to the encounter.  US Venous Img Lower Bilateral  Result Date: 08/28/2016 CLINICAL DATA:  Bilateral lower extremity swelling and erythema. EXAM: BILATERAL LOWER EXTREMITY VENOUS DOPPLER ULTRASOUND TECHNIQUE: Gray-scale sonography with graded compression, as well as color Doppler and duplex ultrasound were performed to evaluate the lower extremity deep venous systems from the level of the common femoral vein and including the common femoral, femoral, profunda femoral, popliteal and calf veins including the posterior tibial, peroneal and gastrocnemius veins when visible. The superficial great saphenous vein was also interrogated. Spectral Doppler was utilized to evaluate flow at rest and with distal augmentation maneuvers in the common femoral, femoral and popliteal  veins. COMPARISON:  None. FINDINGS: RIGHT LOWER EXTREMITY Common Femoral Vein: No evidence of thrombus. Normal compressibility, respiratory phasicity and response to augmentation. Saphenofemoral Junction: No evidence of thrombus. Normal compressibility and flow on color Doppler imaging. Profunda Femoral Vein: No evidence of thrombus. Normal compressibility and flow on color Doppler imaging. Femoral Vein: No evidence of thrombus. Normal compressibility, respiratory phasicity and response to augmentation. Popliteal Vein: No evidence of thrombus. Normal compressibility, respiratory phasicity and response to augmentation. Calf Veins: No evidence of thrombus. Normal compressibility and flow on color Doppler imaging. Superficial Great Saphenous Vein: No evidence of thrombus. Normal compressibility and flow on color Doppler imaging. LEFT LOWER EXTREMITY Common Femoral Vein: No evidence of thrombus. Normal compressibility, respiratory phasicity and response to augmentation. Saphenofemoral Junction: No evidence of thrombus. Normal compressibility and flow on color Doppler imaging. Profunda Femoral Vein: No evidence of thrombus. Normal compressibility and flow on color Doppler imaging. Femoral Vein: No evidence of thrombus. Normal compressibility, respiratory phasicity and response to augmentation. Popliteal Vein: No evidence of thrombus. Normal compressibility, respiratory phasicity and response to augmentation. Calf Veins: No evidence of thrombus. Normal compressibility and flow on color Doppler imaging. Superficial Great Saphenous Vein: No evidence of thrombus. Normal compressibility and flow on color Doppler imaging. Other Findings: Hypoechoic cystic lesion the LEFT groin again noted measuring 7.2 x 5.6 x 3.6 cm IMPRESSION: No evidence of DVT with either lower extremity. A 7 cm cystic lesion in the LEFT groin. Electronically Signed   By: Suzy Bouchard M.D.   On: 08/28/2016 13:21     Assessment and plan- Patient is  a 66 y.o. female ***   Visit Diagnosis 1. Follicular lymphoma of intra-abdominal lymph nodes, unspecified follicular lymphoma type ([place])   2. Encounter for antineoplastic immunotherapy      Dr. Randa Evens, MD, MPH [facility] at [facility] Pager- [phone] 09/25/2016 10:41 AM

## 2016-09-25 NOTE — Patient Outreach (Signed)
[place] [facility]) Care Management  09/25/2016  [name] 05/23/1950 [id number]   66 year old female with MS, lymphoma, COPD, depression, peripheral vascular disease, OSA, shingles, and chronic ulcers and cellulitis in right and left legs with recent hospitalization for cellulitis of left leg.  [facility] pharmacy referred for medication cost assistance with MS medications.   09/25/2016: Per review of patient's chart, noted Dr. Manuella Ghazi, patient's neurologist, aware of patient's current situation with Avonex and Ampyra.  He has suggested an alternative, Rebif, if this is less expensive.  His office also notes [employer]onex has patient assistance program.  I called the [employer]onex patient assistance program to verify eligibility as it states   "People covered by Medicare, Medicaid, the VA/DoD, or any other federal plans are not eligible to enroll" however the representative said they do make exceptions.  She was able to find Ms. Cookson in their database and said the patient just needed to call to let them know she needed cost assistance.    Called patient to review this information with her.  HIPAA identifiers verified.  Patient actually had already started the Avonex patient assistance application process and will be receiving her first shipment within 5 days at no charge.  Patient does not have any further medication needs at this time.  I recommended that she follow-up with Dr. Manuella Ghazi regarding her Ampyra refills as they are now aware that she is not taking this medication and she stated understanding.    Pharmacy will close case but is happy to assist in the future as needed.   Ralene Bathe, PharmD, [place] [phone]

## 2016-09-26 ENCOUNTER — Inpatient Hospital Stay: Payer: Medicare Other | Admitting: Oncology

## 2016-09-26 ENCOUNTER — Inpatient Hospital Stay: Payer: Medicare Other

## 2016-09-27 ENCOUNTER — Telehealth: Payer: Self-pay

## 2016-09-27 DIAGNOSIS — J449 Chronic obstructive pulmonary disease, unspecified: Secondary | ICD-10-CM | POA: Diagnosis not present

## 2016-09-27 DIAGNOSIS — G8911 Acute pain due to trauma: Secondary | ICD-10-CM | POA: Diagnosis not present

## 2016-09-27 DIAGNOSIS — I2729 Other secondary pulmonary hypertension: Secondary | ICD-10-CM | POA: Diagnosis not present

## 2016-09-27 DIAGNOSIS — G629 Polyneuropathy, unspecified: Secondary | ICD-10-CM | POA: Diagnosis not present

## 2016-09-27 DIAGNOSIS — G35 Multiple sclerosis: Secondary | ICD-10-CM | POA: Diagnosis not present

## 2016-09-27 DIAGNOSIS — G501 Atypical facial pain: Secondary | ICD-10-CM | POA: Diagnosis not present

## 2016-09-27 DIAGNOSIS — I739 Peripheral vascular disease, unspecified: Secondary | ICD-10-CM | POA: Diagnosis not present

## 2016-09-27 DIAGNOSIS — G4733 Obstructive sleep apnea (adult) (pediatric): Secondary | ICD-10-CM | POA: Diagnosis not present

## 2016-09-27 DIAGNOSIS — Z9181 History of falling: Secondary | ICD-10-CM | POA: Diagnosis not present

## 2016-09-27 DIAGNOSIS — M5416 Radiculopathy, lumbar region: Secondary | ICD-10-CM | POA: Diagnosis not present

## 2016-09-27 DIAGNOSIS — E559 Vitamin D deficiency, unspecified: Secondary | ICD-10-CM | POA: Diagnosis not present

## 2016-09-27 DIAGNOSIS — G43909 Migraine, unspecified, not intractable, without status migrainosus: Secondary | ICD-10-CM | POA: Diagnosis not present

## 2016-09-27 DIAGNOSIS — I10 Essential (primary) hypertension: Secondary | ICD-10-CM | POA: Diagnosis not present

## 2016-09-27 DIAGNOSIS — Z466 Encounter for fitting and adjustment of urinary device: Secondary | ICD-10-CM | POA: Diagnosis not present

## 2016-09-27 DIAGNOSIS — I950 Idiopathic hypotension: Secondary | ICD-10-CM | POA: Diagnosis not present

## 2016-09-27 DIAGNOSIS — M1990 Unspecified osteoarthritis, unspecified site: Secondary | ICD-10-CM | POA: Diagnosis not present

## 2016-09-27 NOTE — Telephone Encounter (Signed)
That's fine thank you

## 2016-09-27 NOTE — Telephone Encounter (Signed)
[name], PT from [facility] asking to get a verbal for this week and next to do ADLs ( Activities of  Daily living)   so she can help pt  Maintain daily activites on a functional level to. [name] # [phone] just in case you have any questions or concerns.

## 2016-09-28 DIAGNOSIS — I950 Idiopathic hypotension: Secondary | ICD-10-CM | POA: Diagnosis not present

## 2016-09-28 DIAGNOSIS — Z466 Encounter for fitting and adjustment of urinary device: Secondary | ICD-10-CM | POA: Diagnosis not present

## 2016-09-28 DIAGNOSIS — G4733 Obstructive sleep apnea (adult) (pediatric): Secondary | ICD-10-CM | POA: Diagnosis not present

## 2016-09-28 DIAGNOSIS — I2729 Other secondary pulmonary hypertension: Secondary | ICD-10-CM | POA: Diagnosis not present

## 2016-09-28 DIAGNOSIS — Z9181 History of falling: Secondary | ICD-10-CM | POA: Diagnosis not present

## 2016-09-28 DIAGNOSIS — G35 Multiple sclerosis: Secondary | ICD-10-CM | POA: Diagnosis not present

## 2016-09-28 DIAGNOSIS — M5416 Radiculopathy, lumbar region: Secondary | ICD-10-CM | POA: Diagnosis not present

## 2016-09-28 DIAGNOSIS — G43909 Migraine, unspecified, not intractable, without status migrainosus: Secondary | ICD-10-CM | POA: Diagnosis not present

## 2016-09-28 DIAGNOSIS — G629 Polyneuropathy, unspecified: Secondary | ICD-10-CM | POA: Diagnosis not present

## 2016-09-28 DIAGNOSIS — J449 Chronic obstructive pulmonary disease, unspecified: Secondary | ICD-10-CM | POA: Diagnosis not present

## 2016-09-28 DIAGNOSIS — I10 Essential (primary) hypertension: Secondary | ICD-10-CM | POA: Diagnosis not present

## 2016-09-28 DIAGNOSIS — G8911 Acute pain due to trauma: Secondary | ICD-10-CM | POA: Diagnosis not present

## 2016-09-28 DIAGNOSIS — G501 Atypical facial pain: Secondary | ICD-10-CM | POA: Diagnosis not present

## 2016-09-28 DIAGNOSIS — I739 Peripheral vascular disease, unspecified: Secondary | ICD-10-CM | POA: Diagnosis not present

## 2016-09-28 DIAGNOSIS — E559 Vitamin D deficiency, unspecified: Secondary | ICD-10-CM | POA: Diagnosis not present

## 2016-09-28 DIAGNOSIS — M1990 Unspecified osteoarthritis, unspecified site: Secondary | ICD-10-CM | POA: Diagnosis not present

## 2016-09-30 DIAGNOSIS — M1990 Unspecified osteoarthritis, unspecified site: Secondary | ICD-10-CM | POA: Diagnosis not present

## 2016-09-30 DIAGNOSIS — M5416 Radiculopathy, lumbar region: Secondary | ICD-10-CM | POA: Diagnosis not present

## 2016-09-30 DIAGNOSIS — G35 Multiple sclerosis: Secondary | ICD-10-CM | POA: Diagnosis not present

## 2016-09-30 DIAGNOSIS — G4733 Obstructive sleep apnea (adult) (pediatric): Secondary | ICD-10-CM | POA: Diagnosis not present

## 2016-09-30 DIAGNOSIS — I2729 Other secondary pulmonary hypertension: Secondary | ICD-10-CM | POA: Diagnosis not present

## 2016-09-30 DIAGNOSIS — E559 Vitamin D deficiency, unspecified: Secondary | ICD-10-CM | POA: Diagnosis not present

## 2016-09-30 DIAGNOSIS — G43909 Migraine, unspecified, not intractable, without status migrainosus: Secondary | ICD-10-CM | POA: Diagnosis not present

## 2016-09-30 DIAGNOSIS — I10 Essential (primary) hypertension: Secondary | ICD-10-CM | POA: Diagnosis not present

## 2016-09-30 DIAGNOSIS — J449 Chronic obstructive pulmonary disease, unspecified: Secondary | ICD-10-CM | POA: Diagnosis not present

## 2016-09-30 DIAGNOSIS — I739 Peripheral vascular disease, unspecified: Secondary | ICD-10-CM | POA: Diagnosis not present

## 2016-09-30 DIAGNOSIS — G629 Polyneuropathy, unspecified: Secondary | ICD-10-CM | POA: Diagnosis not present

## 2016-09-30 NOTE — Telephone Encounter (Signed)
[name] has been informed.

## 2016-10-01 ENCOUNTER — Encounter: Payer: Self-pay | Admitting: *Deleted

## 2016-10-01 ENCOUNTER — Other Ambulatory Visit: Payer: Self-pay | Admitting: *Deleted

## 2016-10-01 ENCOUNTER — Telehealth: Payer: Self-pay

## 2016-10-01 DIAGNOSIS — I739 Peripheral vascular disease, unspecified: Secondary | ICD-10-CM | POA: Diagnosis not present

## 2016-10-01 DIAGNOSIS — G43909 Migraine, unspecified, not intractable, without status migrainosus: Secondary | ICD-10-CM | POA: Diagnosis not present

## 2016-10-01 DIAGNOSIS — I10 Essential (primary) hypertension: Secondary | ICD-10-CM | POA: Diagnosis not present

## 2016-10-01 DIAGNOSIS — G4733 Obstructive sleep apnea (adult) (pediatric): Secondary | ICD-10-CM | POA: Diagnosis not present

## 2016-10-01 DIAGNOSIS — G35 Multiple sclerosis: Secondary | ICD-10-CM | POA: Diagnosis not present

## 2016-10-01 DIAGNOSIS — J449 Chronic obstructive pulmonary disease, unspecified: Secondary | ICD-10-CM | POA: Diagnosis not present

## 2016-10-01 DIAGNOSIS — E559 Vitamin D deficiency, unspecified: Secondary | ICD-10-CM | POA: Diagnosis not present

## 2016-10-01 DIAGNOSIS — I2729 Other secondary pulmonary hypertension: Secondary | ICD-10-CM | POA: Diagnosis not present

## 2016-10-01 DIAGNOSIS — M5416 Radiculopathy, lumbar region: Secondary | ICD-10-CM | POA: Diagnosis not present

## 2016-10-01 DIAGNOSIS — M1990 Unspecified osteoarthritis, unspecified site: Secondary | ICD-10-CM | POA: Diagnosis not present

## 2016-10-01 DIAGNOSIS — G629 Polyneuropathy, unspecified: Secondary | ICD-10-CM | POA: Diagnosis not present

## 2016-10-01 NOTE — Telephone Encounter (Deleted)
Colletta Maryland, OT asking for Occupational therapy twice a week for four weeks.

## 2016-10-01 NOTE — Telephone Encounter (Signed)
Error

## 2016-10-01 NOTE — Patient Outreach (Signed)
Transition of care call successful, follow up on referral from [place] post acute care coordinator to follow pt post SNF discharge - 5/25, rehab admission from recent hospitalization 5/2-5/4 for cellulitis (left lower extremity), recurrent falls.  History includes but not limited to MS since 1980's, lymphoma, COPD, long time foley cath, recent case of shingles.   Spoke with pt, HIPAA/identity verified, discussed purpose of call- follow up on referral.  Pt reports cellulitis (left lower extremity) is better as well as Shingles.   Pt reports no falls since discharge home, lives with spouse- assists as needed. Pt reports HH PT has started, need to get in touch with [facility] RN- foley catheter needs changing.   Pt reports taking all of her medications as ordered, RN CM unable to review medications at this time as discharge papers not available at this time.  RN CM discussed with pt [facility] transition of care program- follow with weekly calls, a home visit, no cost to her.     Plan:  As discussed with pt, plan to follow up again next week telephonically.            Plan to inform Dr. Sanda Klein of [facility] involvement- send by in [place] letter.   Zara Chess.   [place] Care Management  [phone]

## 2016-10-02 ENCOUNTER — Telehealth: Payer: Self-pay

## 2016-10-02 DIAGNOSIS — E559 Vitamin D deficiency, unspecified: Secondary | ICD-10-CM | POA: Diagnosis not present

## 2016-10-02 DIAGNOSIS — G35 Multiple sclerosis: Secondary | ICD-10-CM | POA: Diagnosis not present

## 2016-10-02 DIAGNOSIS — M5416 Radiculopathy, lumbar region: Secondary | ICD-10-CM | POA: Diagnosis not present

## 2016-10-02 DIAGNOSIS — I739 Peripheral vascular disease, unspecified: Secondary | ICD-10-CM | POA: Diagnosis not present

## 2016-10-02 DIAGNOSIS — J449 Chronic obstructive pulmonary disease, unspecified: Secondary | ICD-10-CM | POA: Diagnosis not present

## 2016-10-02 DIAGNOSIS — G629 Polyneuropathy, unspecified: Secondary | ICD-10-CM | POA: Diagnosis not present

## 2016-10-02 DIAGNOSIS — G4733 Obstructive sleep apnea (adult) (pediatric): Secondary | ICD-10-CM | POA: Diagnosis not present

## 2016-10-02 DIAGNOSIS — I2729 Other secondary pulmonary hypertension: Secondary | ICD-10-CM | POA: Diagnosis not present

## 2016-10-02 DIAGNOSIS — G43909 Migraine, unspecified, not intractable, without status migrainosus: Secondary | ICD-10-CM | POA: Diagnosis not present

## 2016-10-02 DIAGNOSIS — M1990 Unspecified osteoarthritis, unspecified site: Secondary | ICD-10-CM | POA: Diagnosis not present

## 2016-10-02 DIAGNOSIS — I10 Essential (primary) hypertension: Secondary | ICD-10-CM | POA: Diagnosis not present

## 2016-10-02 NOTE — Telephone Encounter (Signed)
Stephanie,OT from [facility]  asking verbal for Occupational therapy.

## 2016-10-02 NOTE — Telephone Encounter (Signed)
That is fine 

## 2016-10-02 NOTE — Telephone Encounter (Signed)
Colletta Maryland, OT from [facility]  has been informed.

## 2016-10-04 ENCOUNTER — Other Ambulatory Visit: Payer: Self-pay | Admitting: *Deleted

## 2016-10-04 ENCOUNTER — Encounter: Payer: Self-pay | Admitting: *Deleted

## 2016-10-04 DIAGNOSIS — G4733 Obstructive sleep apnea (adult) (pediatric): Secondary | ICD-10-CM | POA: Diagnosis not present

## 2016-10-04 DIAGNOSIS — G43909 Migraine, unspecified, not intractable, without status migrainosus: Secondary | ICD-10-CM | POA: Diagnosis not present

## 2016-10-04 DIAGNOSIS — E559 Vitamin D deficiency, unspecified: Secondary | ICD-10-CM | POA: Diagnosis not present

## 2016-10-04 DIAGNOSIS — G629 Polyneuropathy, unspecified: Secondary | ICD-10-CM | POA: Diagnosis not present

## 2016-10-04 DIAGNOSIS — M5416 Radiculopathy, lumbar region: Secondary | ICD-10-CM | POA: Diagnosis not present

## 2016-10-04 DIAGNOSIS — J449 Chronic obstructive pulmonary disease, unspecified: Secondary | ICD-10-CM | POA: Diagnosis not present

## 2016-10-04 DIAGNOSIS — M1990 Unspecified osteoarthritis, unspecified site: Secondary | ICD-10-CM | POA: Diagnosis not present

## 2016-10-04 DIAGNOSIS — G35 Multiple sclerosis: Secondary | ICD-10-CM | POA: Diagnosis not present

## 2016-10-04 DIAGNOSIS — I2729 Other secondary pulmonary hypertension: Secondary | ICD-10-CM | POA: Diagnosis not present

## 2016-10-04 DIAGNOSIS — I10 Essential (primary) hypertension: Secondary | ICD-10-CM | POA: Diagnosis not present

## 2016-10-04 DIAGNOSIS — I739 Peripheral vascular disease, unspecified: Secondary | ICD-10-CM | POA: Diagnosis not present

## 2016-10-04 NOTE — Patient Outreach (Signed)
    [place] [facility]) Care Management  10/04/2016  [name] 01-12-1951 [id number]   Phone call to patient to assess for social work needs following her discharge from SNF care.  Patient recently discharged from [facility]. Patient reports limited support, fears falling. Often waits for her husband to return home for assistance with mobility. Patient has a  walk in shower, however has no grab bars. Patient does have a shower chair.  Per patient, she does not qualify for medicaid due to income eligibility but does not have the income to higher an aid.  Patient would like to discuss available options.   Plan: This Education officer, museum will follow up with patient next week after researching available community resource options.    [name] [facility] Care Management [phone]

## 2016-10-07 ENCOUNTER — Other Ambulatory Visit: Payer: Self-pay | Admitting: *Deleted

## 2016-10-07 DIAGNOSIS — G4733 Obstructive sleep apnea (adult) (pediatric): Secondary | ICD-10-CM | POA: Diagnosis not present

## 2016-10-07 DIAGNOSIS — I10 Essential (primary) hypertension: Secondary | ICD-10-CM | POA: Diagnosis not present

## 2016-10-07 DIAGNOSIS — G629 Polyneuropathy, unspecified: Secondary | ICD-10-CM | POA: Diagnosis not present

## 2016-10-07 DIAGNOSIS — M1990 Unspecified osteoarthritis, unspecified site: Secondary | ICD-10-CM | POA: Diagnosis not present

## 2016-10-07 DIAGNOSIS — E559 Vitamin D deficiency, unspecified: Secondary | ICD-10-CM | POA: Diagnosis not present

## 2016-10-07 DIAGNOSIS — G35 Multiple sclerosis: Secondary | ICD-10-CM | POA: Diagnosis not present

## 2016-10-07 DIAGNOSIS — I2729 Other secondary pulmonary hypertension: Secondary | ICD-10-CM | POA: Diagnosis not present

## 2016-10-07 DIAGNOSIS — M5416 Radiculopathy, lumbar region: Secondary | ICD-10-CM | POA: Diagnosis not present

## 2016-10-07 DIAGNOSIS — I739 Peripheral vascular disease, unspecified: Secondary | ICD-10-CM | POA: Diagnosis not present

## 2016-10-07 DIAGNOSIS — J449 Chronic obstructive pulmonary disease, unspecified: Secondary | ICD-10-CM | POA: Diagnosis not present

## 2016-10-07 DIAGNOSIS — G43909 Migraine, unspecified, not intractable, without status migrainosus: Secondary | ICD-10-CM | POA: Diagnosis not present

## 2016-10-07 NOTE — Patient Outreach (Signed)
[place] [facility]) Care Management  10/07/2016  [name] 1950-11-06 [id number]   Phone call to patient to set up initial home visit to continue to discuss community resource needs.   Home visit scheduled for 10/08/16 at 11:00am.   [name] [facility] Care Management [phone]

## 2016-10-08 ENCOUNTER — Ambulatory Visit: Payer: Self-pay | Admitting: *Deleted

## 2016-10-08 ENCOUNTER — Other Ambulatory Visit: Payer: Self-pay | Admitting: *Deleted

## 2016-10-08 NOTE — Telephone Encounter (Signed)
This encounter was created in error - please disregard.

## 2016-10-08 NOTE — Patient Outreach (Signed)
[place] [facility]) Care Management  10/08/2016  [name] Jun 23, 1950 [id number]   Phone call to patient to re-schedule initial home visit to 10/09/16 due to scheduling conflict.  Spoke with patient's spouse on [facility] consent who stated that patient was in bed but agreed with home visit being re-scheduled for 10/09/16 at 11:00 am. He will inform patient when she wakes up.   Plan: Initial home visit scheduled for 10/09/16.   [name] [facility] Care Management [phone]

## 2016-10-09 ENCOUNTER — Ambulatory Visit: Payer: Medicare Other | Admitting: Family Medicine

## 2016-10-09 ENCOUNTER — Other Ambulatory Visit: Payer: Self-pay | Admitting: *Deleted

## 2016-10-09 ENCOUNTER — Encounter: Payer: Self-pay | Admitting: *Deleted

## 2016-10-09 DIAGNOSIS — M1990 Unspecified osteoarthritis, unspecified site: Secondary | ICD-10-CM | POA: Diagnosis not present

## 2016-10-09 DIAGNOSIS — M5416 Radiculopathy, lumbar region: Secondary | ICD-10-CM | POA: Diagnosis not present

## 2016-10-09 DIAGNOSIS — G4733 Obstructive sleep apnea (adult) (pediatric): Secondary | ICD-10-CM | POA: Diagnosis not present

## 2016-10-09 DIAGNOSIS — J449 Chronic obstructive pulmonary disease, unspecified: Secondary | ICD-10-CM | POA: Diagnosis not present

## 2016-10-09 DIAGNOSIS — G35 Multiple sclerosis: Secondary | ICD-10-CM | POA: Diagnosis not present

## 2016-10-09 DIAGNOSIS — E559 Vitamin D deficiency, unspecified: Secondary | ICD-10-CM | POA: Diagnosis not present

## 2016-10-09 DIAGNOSIS — G629 Polyneuropathy, unspecified: Secondary | ICD-10-CM | POA: Diagnosis not present

## 2016-10-09 DIAGNOSIS — G43909 Migraine, unspecified, not intractable, without status migrainosus: Secondary | ICD-10-CM | POA: Diagnosis not present

## 2016-10-09 DIAGNOSIS — I10 Essential (primary) hypertension: Secondary | ICD-10-CM | POA: Diagnosis not present

## 2016-10-09 DIAGNOSIS — I739 Peripheral vascular disease, unspecified: Secondary | ICD-10-CM | POA: Diagnosis not present

## 2016-10-09 DIAGNOSIS — I2729 Other secondary pulmonary hypertension: Secondary | ICD-10-CM | POA: Diagnosis not present

## 2016-10-09 NOTE — Patient Outreach (Addendum)
[facility]) Care Management  [facility] Social Work  10/09/2016  [name] 08-26-1950 [id number]  Subjective: Patient is a 66 year old female referred by post acute care coordinator to follow up with patient post SNF discharge to assist with community resource needs.  Patient discharged from rehab on 09/20/16. The rehab admission was from a recent hospitalization for cellulitis and multiple falls. Late Entry-family friend Geronimo Boot present during the visit. Patient gave this social worker verbal permission to speak to her regarding patient's care needs. Patient discussed living with her spouse. They have been married for 25 years. however they are estranged. Per patient, due to his own medical issues he cannot assist with personal care giving. He does transport her to medical appointments. Patient has  Family friend [name] that is helpful, however she has no transportation and cannot visit daily due to her own care giving responsibilities for her roommate. Per [name], she will help as much as she can. During the visit, she was helping patient arrange her medications.  Patient discussed being followed by  [facility].  She is currently followed by Psychiatrist Dr. Collie Siad. Per patient, she has been diagnosed with ADHD and Bipolar disorder. Per patient, she is not sure of her  next appointment with the psychiatrist. Patient continues to receive [place] through [facility]. Patient requesting assistance with community assistance for in home care. Patient also requesting assistance with getting a hospital bed.   Objective:   Encounter Medications:  Outpatient Encounter Prescriptions as of 10/09/2016  Medication Sig Note  . AVONEX PEN 30 MCG/0.5ML AJKT Inject 30 mcg into the skin every Monday.   Marland Kitchen buPROPion (WELLBUTRIN XL) 300 MG 24 hr tablet Take 300 mg by mouth every morning.  10/09/2016: Needs refill  . clonazePAM (KLONOPIN) 1 MG tablet Take 1 tablet by mouth at bedtime.    .  DULoxetine (CYMBALTA) 60 MG capsule Take 60 mg by mouth every morning.    . fluticasone furoate-vilanterol (BREO ELLIPTA) 100-25 MCG/INH AEPB Inhale 1 puff into the lungs daily. 08/28/2016: Pt uses as needed  . gabapentin (NEURONTIN) 600 MG tablet TAKE 1 TABLET (600 MG TOTAL) BY MOUTH 3 (THREE) TIMES DAILY.   . hydrochlorothiazide (MICROZIDE) 12.5 MG capsule TAKE ONE CAPSULE (12.5 MG TOTAL) BY MOUTH EVERY DAY (THIS REPLACES TRIAMTERENE/HCTZ)   . lidocaine-prilocaine (EMLA) cream Apply 1 application topically as needed. apply cream over the port site and then place small piece of saran wrap over the cream to protect your clothing   . methylphenidate (RITALIN) 10 MG tablet Take 10 mg by mouth 2 (two) times daily.    . Multiple Vitamin (MULTIVITAMIN WITH MINERALS) TABS tablet Take 1 tablet by mouth daily.   Marland Kitchen oxyCODONE (OXY IR/ROXICODONE) 5 MG immediate release tablet Take 1 tablet (5 mg total) by mouth every 6 (six) hours as needed for moderate pain or severe pain.   . polyethylene glycol powder (GLYCOLAX/MIRALAX) powder Take 17 g by mouth daily. Mixed in water (Patient taking differently: Take 17 g by mouth as needed. Mixed in water)   . potassium chloride (K-DUR) 10 MEQ tablet Take 1 tablet (10 mEq total) by mouth daily. 07/02/2016: 0900  . QUEtiapine Fumarate (SEROQUEL XR) 150 MG 24 hr tablet 150 mg at bedtime.    . solifenacin (VESICARE) 5 MG tablet Take 5 mg by mouth daily. 10/09/2016: Patient taking 10 mg by mouth daily  . tiZANidine (ZANAFLEX) 4 MG tablet Take 4 mg by mouth 4 (four) times daily. TAKES  FOR MS   . topiramate (TOPAMAX) 50 MG tablet TAKE 1 TABLET (50 MG TOTAL) BY MOUTH 2 (TWO) TIMES DAILY. (Patient taking differently: Take 50 mg by mouth daily. ) 08/28/2016: Pt takes once daily   No facility-administered encounter medications on file as of 10/09/2016.     Functional Status:  In your present state of health, do you have any difficulty performing the following activities: 08/28/2016 08/02/2016   Hearing? N N  Vision? N N  Difficulty concentrating or making decisions? N N  Walking or climbing stairs? Y Y  Dressing or bathing? Y Y  Doing errands, shopping? Tempie Donning  Some recent data might be hidden    Fall/Depression Screening:  PHQ 2/9 Scores 08/02/2016 06/18/2016 06/07/2016 12/22/2015 11/08/2015 04/18/2015 01/13/2015  PHQ - 2 Score 1 0 1 6 4 3 2   PHQ- 9 Score - - - 15 16 7 6     Assessment:  Patient friendly, openly discussed conflicts with spouse , limited support and  need for assistance. Patient's friend [name], discussed plan to contact the Cancer Society as well as the MS society for possible assistance. This Education officer, museum also discussed contacting the social work Health visitor at the [employer]. Patient assessed for the  30 day step down care through [facility]. Referral made to begin the 30 day personal care services. Both patient and [name] agree to work on a long term Producer, television/film/video as she cannot assist daily.  Plan: This social worker will follow up with referral for the 30 day personal care services through [facility]. This Education officer, museum will follow up with patient within two weeks.    [name] [facility] Care Management [phone]

## 2016-10-10 ENCOUNTER — Inpatient Hospital Stay: Payer: Medicare Other

## 2016-10-10 ENCOUNTER — Other Ambulatory Visit: Payer: Self-pay | Admitting: *Deleted

## 2016-10-10 ENCOUNTER — Inpatient Hospital Stay: Payer: Medicare Other | Admitting: Oncology

## 2016-10-10 NOTE — Patient Outreach (Signed)
[facility]) Care Management  10/10/2016  [name] Sep 28, 1950 [id number]   Phone call from [name] from [facility] requesting patient's demographic sheet and medication list to begin services.   [name] [facility] Care Management [phone]

## 2016-10-10 NOTE — Patient Outreach (Signed)
[facility]) Care Management  10/10/2016  KWYNN SCHLOTTER 11-13-1950 [id number]   Phone call to patient to suggest that she speak with her [facility] PT to discuss need for  Hospital bed. There was no answer. There was a message on the phone stating that she had voicemail box that had not been set up yet.    Plan: This Education officer, museum will call patient again within 1 week.    [name] [facility] Care Management [phone]

## 2016-10-11 ENCOUNTER — Other Ambulatory Visit: Payer: Self-pay | Admitting: *Deleted

## 2016-10-11 ENCOUNTER — Encounter: Payer: Self-pay | Admitting: *Deleted

## 2016-10-11 ENCOUNTER — Ambulatory Visit: Payer: Self-pay | Admitting: *Deleted

## 2016-10-11 ENCOUNTER — Inpatient Hospital Stay: Payer: Medicare Other

## 2016-10-11 ENCOUNTER — Inpatient Hospital Stay
Admission: EM | Admit: 2016-10-11 | Discharge: 2016-10-14 | DRG: 603 | Disposition: A | Discharge status: Skilled Nursing Facility | Payer: Medicare Other | Attending: Internal Medicine | Admitting: Internal Medicine

## 2016-10-11 ENCOUNTER — Encounter: Payer: Self-pay | Admitting: Emergency Medicine

## 2016-10-11 DIAGNOSIS — Z79899 Other long term (current) drug therapy: Secondary | ICD-10-CM | POA: Diagnosis not present

## 2016-10-11 DIAGNOSIS — J449 Chronic obstructive pulmonary disease, unspecified: Secondary | ICD-10-CM | POA: Diagnosis present

## 2016-10-11 DIAGNOSIS — G35 Multiple sclerosis: Secondary | ICD-10-CM | POA: Diagnosis present

## 2016-10-11 DIAGNOSIS — G629 Polyneuropathy, unspecified: Secondary | ICD-10-CM | POA: Diagnosis not present

## 2016-10-11 DIAGNOSIS — I2729 Other secondary pulmonary hypertension: Secondary | ICD-10-CM | POA: Diagnosis not present

## 2016-10-11 DIAGNOSIS — Z87891 Personal history of nicotine dependence: Secondary | ICD-10-CM | POA: Diagnosis not present

## 2016-10-11 DIAGNOSIS — G43909 Migraine, unspecified, not intractable, without status migrainosus: Secondary | ICD-10-CM | POA: Diagnosis not present

## 2016-10-11 DIAGNOSIS — F329 Major depressive disorder, single episode, unspecified: Secondary | ICD-10-CM | POA: Diagnosis present

## 2016-10-11 DIAGNOSIS — L03115 Cellulitis of right lower limb: Secondary | ICD-10-CM | POA: Diagnosis not present

## 2016-10-11 DIAGNOSIS — I129 Hypertensive chronic kidney disease with stage 1 through stage 4 chronic kidney disease, or unspecified chronic kidney disease: Secondary | ICD-10-CM | POA: Diagnosis present

## 2016-10-11 DIAGNOSIS — I739 Peripheral vascular disease, unspecified: Secondary | ICD-10-CM | POA: Diagnosis present

## 2016-10-11 DIAGNOSIS — M79662 Pain in left lower leg: Secondary | ICD-10-CM | POA: Diagnosis not present

## 2016-10-11 DIAGNOSIS — F419 Anxiety disorder, unspecified: Secondary | ICD-10-CM | POA: Diagnosis present

## 2016-10-11 DIAGNOSIS — M1990 Unspecified osteoarthritis, unspecified site: Secondary | ICD-10-CM | POA: Diagnosis not present

## 2016-10-11 DIAGNOSIS — L03119 Cellulitis of unspecified part of limb: Secondary | ICD-10-CM

## 2016-10-11 DIAGNOSIS — C8290 Follicular lymphoma, unspecified, unspecified site: Secondary | ICD-10-CM | POA: Diagnosis present

## 2016-10-11 DIAGNOSIS — Z6841 Body Mass Index (BMI) 40.0 and over, adult: Secondary | ICD-10-CM | POA: Diagnosis not present

## 2016-10-11 DIAGNOSIS — L03116 Cellulitis of left lower limb: Secondary | ICD-10-CM | POA: Diagnosis not present

## 2016-10-11 DIAGNOSIS — F909 Attention-deficit hyperactivity disorder, unspecified type: Secondary | ICD-10-CM | POA: Diagnosis present

## 2016-10-11 DIAGNOSIS — N183 Chronic kidney disease, stage 3 unspecified: Secondary | ICD-10-CM

## 2016-10-11 DIAGNOSIS — L97309 Non-pressure chronic ulcer of unspecified ankle with unspecified severity: Secondary | ICD-10-CM | POA: Diagnosis present

## 2016-10-11 DIAGNOSIS — M7989 Other specified soft tissue disorders: Secondary | ICD-10-CM | POA: Diagnosis not present

## 2016-10-11 DIAGNOSIS — Z5181 Encounter for therapeutic drug level monitoring: Secondary | ICD-10-CM

## 2016-10-11 DIAGNOSIS — E559 Vitamin D deficiency, unspecified: Secondary | ICD-10-CM | POA: Diagnosis not present

## 2016-10-11 DIAGNOSIS — C8300 Small cell B-cell lymphoma, unspecified site: Secondary | ICD-10-CM | POA: Diagnosis not present

## 2016-10-11 DIAGNOSIS — N139 Obstructive and reflux uropathy, unspecified: Secondary | ICD-10-CM | POA: Diagnosis not present

## 2016-10-11 DIAGNOSIS — G4733 Obstructive sleep apnea (adult) (pediatric): Secondary | ICD-10-CM | POA: Diagnosis not present

## 2016-10-11 DIAGNOSIS — I10 Essential (primary) hypertension: Secondary | ICD-10-CM

## 2016-10-11 DIAGNOSIS — I509 Heart failure, unspecified: Secondary | ICD-10-CM | POA: Diagnosis not present

## 2016-10-11 DIAGNOSIS — C8293 Follicular lymphoma, unspecified, intra-abdominal lymph nodes: Secondary | ICD-10-CM

## 2016-10-11 DIAGNOSIS — M5416 Radiculopathy, lumbar region: Secondary | ICD-10-CM | POA: Diagnosis not present

## 2016-10-11 DIAGNOSIS — R609 Edema, unspecified: Secondary | ICD-10-CM

## 2016-10-11 DIAGNOSIS — M6281 Muscle weakness (generalized): Secondary | ICD-10-CM | POA: Diagnosis not present

## 2016-10-11 DIAGNOSIS — Z79620 Long term (current) use of immunosuppressive biologic: Secondary | ICD-10-CM

## 2016-10-11 LAB — COMPREHENSIVE METABOLIC PANEL
ALT: 20 U/L (ref 14–54)
AST: 28 U/L (ref 15–41)
Albumin: 3.9 g/dL (ref 3.5–5.0)
Alkaline Phosphatase: 74 U/L (ref 38–126)
Anion gap: 8 (ref 5–15)
BUN: 23 mg/dL — ABNORMAL HIGH (ref 6–20)
CO2: 26 mmol/L (ref 22–32)
Calcium: 9.2 mg/dL (ref 8.9–10.3)
Chloride: 102 mmol/L (ref 101–111)
Creatinine, Ser: 1.38 mg/dL — ABNORMAL HIGH (ref 0.44–1.00)
GFR calc Af Amer: 45 mL/min — ABNORMAL LOW (ref >60)
GFR calc non Af Amer: 39 mL/min — ABNORMAL LOW (ref >60)
Glucose, Bld: 110 mg/dL — ABNORMAL HIGH (ref 65–99)
Potassium: 3.5 mmol/L (ref 3.5–5.1)
Sodium: 136 mmol/L (ref 135–145)
Total Bilirubin: 0.5 mg/dL (ref 0.3–1.2)
Total Protein: 7.6 g/dL (ref 6.5–8.1)

## 2016-10-11 LAB — CBC
HCT: 36 % (ref 35.0–47.0)
Hemoglobin: 12.3 g/dL (ref 12.0–16.0)
MCH: 28.6 pg (ref 26.0–34.0)
MCHC: 34.2 g/dL (ref 32.0–36.0)
MCV: 83.6 fL (ref 80.0–100.0)
Platelets: 308 10*3/uL (ref 150–440)
RBC: 4.3 MIL/uL (ref 3.80–5.20)
RDW: 16.1 % — ABNORMAL HIGH (ref 11.5–14.5)
WBC: 8.8 10*3/uL (ref 3.6–11.0)

## 2016-10-11 MED ORDER — GABAPENTIN 600 MG PO TABS
600.0000 mg | ORAL_TABLET | Freq: Three times a day (TID) | ORAL | Status: DC
  Administered 2016-10-12 – 2016-10-14 (×8): 600 mg via ORAL
  Filled 2016-10-11 (×8): qty 1

## 2016-10-11 MED ORDER — POTASSIUM CHLORIDE CRYS ER 20 MEQ PO TBCR
20.0000 meq | EXTENDED_RELEASE_TABLET | Freq: Two times a day (BID) | ORAL | Status: DC
  Administered 2016-10-11 – 2016-10-14 (×6): 20 meq via ORAL
  Filled 2016-10-11 (×5): qty 1

## 2016-10-11 MED ORDER — CEFAZOLIN SODIUM-DEXTROSE 1-4 GM/50ML-% IV SOLN
1.0000 g | Freq: Once | INTRAVENOUS | Status: AC
  Administered 2016-10-11: 1 g via INTRAVENOUS
  Filled 2016-10-11: qty 50

## 2016-10-11 MED ORDER — BUPROPION HCL ER (XL) 300 MG PO TB24
300.0000 mg | ORAL_TABLET | ORAL | Status: DC
  Administered 2016-10-12 – 2016-10-14 (×3): 300 mg via ORAL
  Filled 2016-10-11 (×3): qty 1

## 2016-10-11 MED ORDER — ACETAMINOPHEN 325 MG PO TABS
650.0000 mg | ORAL_TABLET | Freq: Four times a day (QID) | ORAL | Status: DC | PRN
  Administered 2016-10-12: 650 mg via ORAL
  Filled 2016-10-11: qty 2

## 2016-10-11 MED ORDER — METHYLPHENIDATE HCL 5 MG PO TABS
10.0000 mg | ORAL_TABLET | Freq: Two times a day (BID) | ORAL | Status: DC
  Administered 2016-10-12 – 2016-10-14 (×5): 10 mg via ORAL
  Filled 2016-10-11 (×5): qty 2

## 2016-10-11 MED ORDER — POLYETHYLENE GLYCOL 3350 17 G PO PACK: 17.0000 g | PACK | ORAL | Status: DC | PRN

## 2016-10-11 MED ORDER — SODIUM CHLORIDE 0.9 % IV SOLN: 250.0000 mL | INTRAVENOUS | Status: DC | PRN

## 2016-10-11 MED ORDER — POTASSIUM CHLORIDE CRYS ER 20 MEQ PO TBCR
EXTENDED_RELEASE_TABLET | ORAL | Status: AC
  Filled 2016-10-11: qty 1

## 2016-10-11 MED ORDER — ONDANSETRON HCL 4 MG PO TABS: 4.0000 mg | ORAL_TABLET | Freq: Four times a day (QID) | ORAL | Status: DC | PRN

## 2016-10-11 MED ORDER — OXYCODONE HCL 5 MG PO TABS
5.0000 mg | ORAL_TABLET | Freq: Four times a day (QID) | ORAL | Status: DC | PRN
Start: 2016-10-11 — End: 2016-10-14
  Administered 2016-10-13: 5 mg via ORAL
  Filled 2016-10-11: qty 1

## 2016-10-11 MED ORDER — LISINOPRIL 5 MG PO TABS
ORAL_TABLET | ORAL | Status: AC
  Filled 2016-10-11: qty 1

## 2016-10-11 MED ORDER — ACETAMINOPHEN 650 MG RE SUPP: 650.0000 mg | Freq: Four times a day (QID) | RECTAL | Status: DC | PRN

## 2016-10-11 MED ORDER — ENOXAPARIN SODIUM 40 MG/0.4ML ~~LOC~~ SOLN
40.0000 mg | Freq: Two times a day (BID) | SUBCUTANEOUS | Status: DC
  Administered 2016-10-12 – 2016-10-14 (×5): 40 mg via SUBCUTANEOUS
  Filled 2016-10-11 (×5): qty 0.4

## 2016-10-11 MED ORDER — SODIUM CHLORIDE 0.9% FLUSH: 3.0000 mL | INTRAVENOUS | Status: DC | PRN

## 2016-10-11 MED ORDER — FUROSEMIDE 10 MG/ML IJ SOLN
INTRAMUSCULAR | Status: AC
  Filled 2016-10-11: qty 4

## 2016-10-11 MED ORDER — SODIUM CHLORIDE 0.9% FLUSH
3.0000 mL | Freq: Two times a day (BID) | INTRAVENOUS | Status: DC
  Administered 2016-10-12 – 2016-10-14 (×6): 3 mL via INTRAVENOUS

## 2016-10-11 MED ORDER — FUROSEMIDE 10 MG/ML IJ SOLN
20.0000 mg | Freq: Once | INTRAMUSCULAR | Status: AC
  Administered 2016-10-11: 20 mg via INTRAVENOUS

## 2016-10-11 MED ORDER — FUROSEMIDE 40 MG PO TABS
40.0000 mg | ORAL_TABLET | Freq: Every day | ORAL | Status: DC
Start: 2016-10-12 — End: 2016-10-14
  Administered 2016-10-12 – 2016-10-14 (×3): 40 mg via ORAL
  Filled 2016-10-11 (×3): qty 1

## 2016-10-11 MED ORDER — QUETIAPINE FUMARATE ER 50 MG PO TB24
150.0000 mg | ORAL_TABLET | Freq: Every day | ORAL | Status: DC
  Administered 2016-10-12 – 2016-10-13 (×3): 150 mg via ORAL
  Filled 2016-10-11 (×4): qty 3

## 2016-10-11 MED ORDER — DARIFENACIN HYDROBROMIDE ER 7.5 MG PO TB24
7.5000 mg | ORAL_TABLET | Freq: Every day | ORAL | Status: DC
  Administered 2016-10-12 – 2016-10-14 (×3): 7.5 mg via ORAL
  Filled 2016-10-11 (×3): qty 1

## 2016-10-11 MED ORDER — POLYETHYLENE GLYCOL 3350 17 GM/SCOOP PO POWD: 17.0000 g | ORAL | Status: DC | PRN

## 2016-10-11 MED ORDER — INTERFERON BETA-1A 30 MCG/0.5ML IM AJKT: 30.0000 ug | AUTO-INJECTOR | INTRAMUSCULAR | Status: DC

## 2016-10-11 MED ORDER — DULOXETINE HCL 60 MG PO CPEP
60.0000 mg | ORAL_CAPSULE | ORAL | Status: DC
  Administered 2016-10-12 – 2016-10-14 (×3): 60 mg via ORAL
  Filled 2016-10-11: qty 2
  Filled 2016-10-11: qty 1
  Filled 2016-10-11 (×2): qty 2

## 2016-10-11 MED ORDER — FLUTICASONE FUROATE-VILANTEROL 100-25 MCG/INH IN AEPB
1.0000 | INHALATION_SPRAY | Freq: Every day | RESPIRATORY_TRACT | Status: DC
  Administered 2016-10-12 – 2016-10-14 (×3): 1 via RESPIRATORY_TRACT
  Filled 2016-10-11: qty 28

## 2016-10-11 MED ORDER — TIZANIDINE HCL 4 MG PO TABS
4.0000 mg | ORAL_TABLET | Freq: Four times a day (QID) | ORAL | Status: DC
  Administered 2016-10-12 – 2016-10-14 (×10): 4 mg via ORAL
  Filled 2016-10-11 (×10): qty 1

## 2016-10-11 MED ORDER — TOPIRAMATE 100 MG PO TABS
50.0000 mg | ORAL_TABLET | Freq: Every day | ORAL | Status: DC
  Administered 2016-10-12 – 2016-10-14 (×3): 50 mg via ORAL
  Filled 2016-10-11: qty 2
  Filled 2016-10-11: qty 0.5
  Filled 2016-10-11: qty 2

## 2016-10-11 MED ORDER — LISINOPRIL 5 MG PO TABS
5.0000 mg | ORAL_TABLET | Freq: Every day | ORAL | Status: DC
Start: 2016-10-11 — End: 2016-10-14
  Administered 2016-10-11 – 2016-10-14 (×4): 5 mg via ORAL
  Filled 2016-10-11 (×3): qty 1

## 2016-10-11 MED ORDER — CLONAZEPAM 1 MG PO TABS
1.0000 mg | ORAL_TABLET | Freq: Every day | ORAL | Status: DC
  Administered 2016-10-12 – 2016-10-13 (×3): 1 mg via ORAL
  Filled 2016-10-11 (×3): qty 1

## 2016-10-11 MED ORDER — ONDANSETRON HCL 4 MG/2ML IJ SOLN
4.0000 mg | Freq: Four times a day (QID) | INTRAMUSCULAR | Status: DC | PRN
Start: 2016-10-11 — End: 2016-10-14

## 2016-10-11 NOTE — ED Notes (Signed)
Additional warm blankets provided. Pt updated on admission process. Pt verbalizes understanding.

## 2016-10-11 NOTE — Patient Outreach (Signed)
This RN CM planned to make another attempt to call pt for transition of care but view in Epic pt currently in ED - complaint of bilateral lower leg edema and redness.      Plan:  RN CM to follow up with pt next week (next business day) telephonically.     Zara Chess.   [place] Care Management  [phone]

## 2016-10-11 NOTE — ED Notes (Signed)
Report from kristin, rn.

## 2016-10-11 NOTE — ED Notes (Signed)
hospitalist in to see pt.

## 2016-10-11 NOTE — Patient Outreach (Signed)
[facility]) Care Management  10/11/2016  [name] 08-12-1950 [id number]   Phone call to patient to suggest that she speak with her regarding referral to [facility] and to recommend that she discuss need for  hospital bed. With [facility] PTThere was no answer. There was a message on the phone stating that she had voicemail box that had not been set up yet.    Plan: This Education officer, museum will call patient again within 1 week.    [name] [name] [phone]

## 2016-10-11 NOTE — Progress Notes (Signed)
Lovenox changed to 40 mg BID for BMI >40 and CrCl >30. 

## 2016-10-11 NOTE — ED Triage Notes (Signed)
Patient from home with complaint of bilateral lower leg edema and redness. HX of cellulitis. EMS Reports "poor living conditions" requests social work consult

## 2016-10-11 NOTE — ED Notes (Signed)
Pt to ultrasound. Pt to go to floor from ultrasound.
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
## 2016-10-11 NOTE — ED Provider Notes (Addendum)
[facility] Emergency Department Provider Note   ____________________________________________    I have reviewed the triage vital signs and the nursing notes.   HISTORY  Chief Complaint Leg Swelling     HPI [name] is a 66 y.o. female who presents with complaints of leg swelling and redness bilaterally. Patient reports legs are constantly swollen and she has a history of cellulitis in the past. She reports both legs are erythematous and painful. She reports she was on antibiotics one week ago with no significant improvement. She is unsure if she has had a fever. She reports a history of lymphoma but missed her chemotherapy appointment yesterday apparently because she missed her alarm   Past Medical History:  Diagnosis Date  . ADHD   . Anxiety   . COPD (chronic obstructive pulmonary disease) ([place])   . Depression    major depressive  . Dyspnea    doe  . Edema    left leg  . Follicular lymphoma ([place])    B Cell  . Hypertension   . Hypotension    idiopathic  . Kyphoscoliosis and scoliosis 11/26/2011  . Morbid obesity ([place]) 01/05/2016  . Multiple sclerosis ([place])   . Multiple sclerosis ([place])    1980's  . Neuromuscular disorder ([place])   . Obstructive and reflux uropathy    foley  . Pain    atypical facial  . Peripheral vascular disease of lower extremity with ulceration ([place]) 11/08/2015  . Skin ulcer ([place]) 11/08/2015  . Weakness    generalized. has MS    Patient Active Problem List   Diagnosis Date Noted  . Cellulitis of left lower extremity 10/11/2016  . Swelling of lower extremity 10/11/2016  . CKD (chronic kidney disease), stage III 10/11/2016  . Obstructive sleep apnea 10/11/2016  . Malignant essential hypertension 10/11/2016  . Bilateral lower leg cellulitis 10/11/2016  . Follicular lymphoma of intra-abdominal lymph nodes ([place]) 08/06/2016  . Multiple falls 06/07/2016  . Inguinal adenopathy 05/29/2016  . Breast cancer screening  01/05/2016  . Morbid obesity ([place]) 01/05/2016  . Pressure ulcer 12/19/2015  . Low HDL (under 40) 12/19/2015  . Cellulitis 12/18/2015  . Skin ulcer ([place]) 11/08/2015  . Peripheral vascular disease of lower extremity with ulceration ([place]) 11/08/2015  . CKD (chronic kidney disease) stage 3, GFR 30-59 ml/min 11/08/2015  . Decubitus ulcer of left thigh, stage 2 04/18/2015  . Constipation due to pain medication 01/31/2015  . Obstructive sleep apnea of adult 01/13/2015  . Pelvic muscle wasting 01/13/2015  . Incomplete bladder emptying 01/13/2015  . Headache, migraine 10/24/2014  . 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 Current tobacco use 10/24/2014  . Lumbar radiculopathy, chronic 10/02/2013  . COPD with bronchial hyperresponsiveness ([place]) 10/02/2013  . Major depressive disorder, recurrent, in partial remission ([place]) 10/02/2013  . Hypertension goal BP (blood pressure) < 140/90 10/02/2013  . Multiple sclerosis ([place]) 10/02/2013  . Absence of bladder continence 09/25/2012  . Acontractile bladder 02/12/2012  . Bladder retention 02/12/2012  . Narrowing of intervertebral disc space 11/26/2011  . Kyphoscoliosis and scoliosis 11/26/2011    Past Surgical History:  Procedure Laterality Date  . BACK SURGERY N/A 2002  . CYST EXCISION     lower back  . INGUINAL LYMPH NODE BIOPSY Left 07/04/2016   Procedure: INGUINAL LYMPH NODE BIOPSY;  Surgeon: Christene Lye, MD;  Location: [facility] ORS;  Service: General;  Laterality: Left;  . PORTACATH PLACEMENT N/A 07/22/2016   Procedure: INSERTION PORT-A-CATH;  Surgeon: Daryel Gerald  Robinette Haines, MD;  Location: [facility] ORS;  Service: General;  Laterality: N/A;  . TONSILLECTOMY AND ADENOIDECTOMY    . TUBAL LIGATION      Prior to Admission medications   Medication Sig Start Date End Date Taking? Authorizing Provider  AVONEX PEN 30 MCG/0.5ML AJKT Inject 30 mcg into the skin every Monday. 04/28/16  Yes [provider]  buPROPion (WELLBUTRIN XL) 300 MG 24 hr  tablet Take 300 mg by mouth every morning.  05/28/16  Yes [provider]  clonazePAM (KLONOPIN) 1 MG tablet Take 1 tablet by mouth at bedtime.  07/12/13  Yes [provider]  DULoxetine (CYMBALTA) 60 MG capsule Take 60 mg by mouth every morning.  07/26/13  Yes [provider]  fluticasone furoate-vilanterol (BREO ELLIPTA) 100-25 MCG/INH AEPB Inhale 1 puff into the lungs daily. 01/05/16  Yes Lada, Satira Anis, MD  gabapentin (NEURONTIN) 600 MG tablet TAKE 1 TABLET (600 MG TOTAL) BY MOUTH 3 (THREE) TIMES DAILY. 08/07/16  Yes Lada, Satira Anis, MD  hydrochlorothiazide (MICROZIDE) 12.5 MG capsule TAKE ONE CAPSULE (12.5 MG TOTAL) BY MOUTH EVERY DAY (THIS REPLACES TRIAMTERENE/HCTZ) 08/07/16  Yes Lada, Satira Anis, MD  lidocaine-prilocaine (EMLA) cream Apply 1 application topically as needed. apply cream over the port site and then place small piece of saran wrap over the cream to protect your clothing 08/14/16  Yes Sindy Guadeloupe, MD  methylphenidate (RITALIN) 10 MG tablet Take 10 mg by mouth 2 (two) times daily.  01/23/15  Yes [provider]  Multiple Vitamin (MULTIVITAMIN WITH MINERALS) TABS tablet Take 1 tablet by mouth daily.   Yes [provider]  polyethylene glycol powder (GLYCOLAX/MIRALAX) powder Take 17 g by mouth daily. Mixed in water Patient taking differently: Take 17 g by mouth as needed. Mixed in water 03/11/16  Yes Lada, Satira Anis, MD  potassium chloride (K-DUR) 10 MEQ tablet Take 1 tablet (10 mEq total) by mouth daily. 12/22/15  Yes Lada, Satira Anis, MD  QUEtiapine Fumarate (SEROQUEL XR) 150 MG 24 hr tablet 150 mg at bedtime.  08/14/16  Yes [provider]  solifenacin (VESICARE) 5 MG tablet Take 5 mg by mouth daily.   Yes [provider]  tiZANidine (ZANAFLEX) 4 MG tablet Take 4 mg by mouth 4 (four) times daily. TAKES FOR MS   Yes [provider]  topiramate (TOPAMAX) 50 MG tablet TAKE 1 TABLET (50 MG TOTAL) BY MOUTH 2 (TWO) TIMES  DAILY. Patient taking differently: Take 50 mg by mouth daily.  08/07/16  Yes Lada, Satira Anis, MD  oxyCODONE (OXY IR/ROXICODONE) 5 MG immediate release tablet Take 1 tablet (5 mg total) by mouth every 6 (six) hours as needed for moderate pain or severe pain. 08/30/16   Vaughan Basta, MD     Allergies Patient has no known allergies.  Family History  Problem Relation Age of Onset  . COPD Mother   . Diabetes Mother   . Heart failure Mother   . Alcohol abuse Father   . Kidney disease Father   . Kidney failure Father   . Arthritis Sister   . CAD Maternal Grandmother   . Stroke Maternal Grandfather   . Diabetes Sister   . Arthritis Sister   . Arthritis Brother     Social History Social History  Substance Use Topics  . Smoking status: Former Smoker    Packs/day: 1.00    Years: 20.00    Types: Cigarettes    Quit date: 02/03/2016  . Smokeless tobacco:  Never Used  . Alcohol use No    Review of Systems  Constitutional: No fever/chills Eyes: No visual changes.  ENT: No sore throat. Cardiovascular: Denies chest pain. Respiratory: Denies shortness of breath. Gastrointestinal: No abdominal pain.  No nausea, no vomiting.   Genitourinary: Negative for dysuria. Musculoskeletal: Chronic back pain Skin: As above Neurological: Negative for headaches or weakness   ____________________________________________   PHYSICAL EXAM:  VITAL SIGNS: ED Triage Vitals  Enc Vitals Group     BP 10/11/16 1540 (!) 158/98     Pulse Rate 10/11/16 1540 75     Resp 10/11/16 1540 18     Temp 10/11/16 1540 98.9 F (37.2 C)     Temp Source 10/11/16 1540 Oral     SpO2 10/11/16 1540 95 %     Weight 10/11/16 1534 129.7 kg (286 lb)     Height 10/11/16 1534 1.6 m (5\' 3" )     Head Circumference --      [place] --      Pain Score 10/11/16 1527 6     Pain Loc --      Pain Edu? --      Excl. in [place]? --     Constitutional: Alert and oriented. No acute distress.  Eyes: Conjunctivae are  normal.   Nose: No congestion/rhinnorhea. Mouth/Throat: Mucous membranes are moist.    Cardiovascular: Normal rate, regular rhythm. Grossly normal heart sounds.  Good peripheral circulation. Respiratory: Normal respiratory effort.  No retractions. Lungs CTAB. Gastrointestinal: Soft and nontender. No distention.  No CVA tenderness. Genitourinary: deferred Musculoskeletal: Lower extremity swelling, edema. Erythema extending up the anterior surface of both legs, there appears to be a small skin break at the Center, no crepitus. Neurologic:  Normal speech and language. No gross focal neurologic deficits are appreciated.  Skin:  Skin is warm, dry.as above Psychiatric: Mood and affect are normal. Speech and behavior are normal.  ____________________________________________   LABS (all labs ordered are listed, but only abnormal results are displayed)  Labs Reviewed  CBC - Abnormal; Notable for the following:       Result Value   RDW 16.1 (*)    All other components within normal limits  COMPREHENSIVE METABOLIC PANEL - Abnormal; Notable for the following:    Glucose, Bld 110 (*)    BUN 23 (*)    Creatinine, Ser 1.38 (*)    GFR calc non Af Amer 39 (*)    GFR calc Af Amer 45 (*)    All other components within normal limits  CULTURE, BLOOD (ROUTINE X 2)  CULTURE, BLOOD (ROUTINE X 2)   ____________________________________________  EKG  ED ECG REPORT I, Lavonia Drafts, the attending physician, personally viewed and interpreted this ECG.  Date: 10/11/2016  Rhythm: normal sinus rhythm QRS Axis: normal Intervals: normal ST/T Wave abnormalities: Nonspecific changes   ____________________________________________  RADIOLOGY None ____________________________________________   PROCEDURES  Procedure(s) performed: No    Critical Care performed: none ____________________________________________   INITIAL IMPRESSION / ASSESSMENT AND PLAN / ED COURSE  Pertinent labs &  imaging results that were available during my care of the patient were reviewed by me and considered in my medical decision making (see chart for details).  Patient presents with bilateral lower extremity cellulitis. She has had this in the past. Apparently she has been on antibiotics one week ago, she reports a history of lymphoma, she does have a port. I will start IV Ancef which has worked for her in the past and  admitted to the hospitalist service    ____________________________________________   FINAL CLINICAL IMPRESSION(S) / ED DIAGNOSES  Final diagnoses:  Cellulitis of lower extremity, unspecified laterality      NEW MEDICATIONS STARTED DURING THIS VISIT:  New Prescriptions   No medications on file     Note:  This document was prepared using Dragon voice recognition software and may include unintentional dictation errors.    Lavonia Drafts, MD 10/11/16 1901    Lavonia Drafts, MD 10/11/16 2003

## 2016-10-11 NOTE — H&P (Signed)
[facility] NAME: [name]    MR#:  [id number]  DATE OF BIRTH:  1950-05-19  DATE OF ADMISSION:  10/11/2016  PRIMARY CARE PHYSICIAN: Arnetha Courser, MD   REQUESTING/REFERRING PHYSICIAN:   CHIEF COMPLAINT:   Chief Complaint  Patient presents with  . Leg Swelling    HISTORY OF PRESENT ILLNESS: [name]  is a 66 y.o. female with a known history of Obstructive sleep apnea, not on CPAP at home, COPD, depression, lymphoma, hypertension, morbid obesity, multiple sclerosis, obstructive and reflux uropathy status post chronic indwelling Foley catheter, who presented to the hospital with complaints of lower extremity swelling, redness and pressure. Lower extremities. According to the patient, she took antibiotic therapy, but her redness did not improve with. She has been having left lower extremity redness follow him while, however, her right leg and now is also erythematous for the past one week. She has a left above the medial malleolus ulceration, which is draining intermittently. She admits of history of obstructive sleep apnea, however, never get used to CPAP, so she does not wear one. She gets surprised when I tell her that her lower extremity swelling could be related to obstructive sleep apnea. No echocardiograms on the computer  PAST MEDICAL HISTORY:   Past Medical History:  Diagnosis Date  . ADHD   . Anxiety   . COPD (chronic obstructive pulmonary disease) ([place])   . Depression    major depressive  . Dyspnea    doe  . Edema    left leg  . Follicular lymphoma ([place])    B Cell  . Hypertension   . Hypotension    idiopathic  . Kyphoscoliosis and scoliosis 11/26/2011  . Morbid obesity ([place]) 01/05/2016  . Multiple sclerosis ([place])   . Multiple sclerosis ([place])    1980's  . Neuromuscular disorder ([place])   . Obstructive and reflux uropathy    foley  . Pain    atypical facial  . Peripheral vascular disease of lower  extremity with ulceration ([place]) 11/08/2015  . Skin ulcer ([place]) 11/08/2015  . Weakness    generalized. has MS    PAST SURGICAL HISTORY: Past Surgical History:  Procedure Laterality Date  . BACK SURGERY N/A 2002  . CYST EXCISION     lower back  . INGUINAL LYMPH NODE BIOPSY Left 07/04/2016   Procedure: INGUINAL LYMPH NODE BIOPSY;  Surgeon: Christene Lye, MD;  Location: [facility] ORS;  Service: General;  Laterality: Left;  . PORTACATH PLACEMENT N/A 07/22/2016   Procedure: INSERTION PORT-A-CATH;  Surgeon: Christene Lye, MD;  Location: [facility] ORS;  Service: General;  Laterality: N/A;  . TONSILLECTOMY AND ADENOIDECTOMY    . TUBAL LIGATION      SOCIAL HISTORY:  Social History  Substance Use Topics  . Smoking status: Former Smoker    Packs/day: 1.00    Years: 20.00    Types: Cigarettes    Quit date: 02/03/2016  . Smokeless tobacco: Never Used  . Alcohol use No    FAMILY HISTORY:  Family History  Problem Relation Age of Onset  . COPD Mother   . Diabetes Mother   . Heart failure Mother   . Alcohol abuse Father   . Kidney disease Father   . Kidney failure Father   . Arthritis Sister   . CAD Maternal Grandmother   . Stroke Maternal Grandfather   . Diabetes Sister   . Arthritis Sister   .  Arthritis Brother     DRUG ALLERGIES: No Known Allergies  Review of Systems  Constitutional: Positive for malaise/fatigue. Negative for chills, fever and weight loss.  HENT: Negative for congestion.   Eyes: Positive for blurred vision. Negative for double vision.  Respiratory: Negative for cough, sputum production, shortness of breath and wheezing.   Cardiovascular: Positive for leg swelling. Negative for chest pain, palpitations, orthopnea and PND.  Gastrointestinal: Positive for abdominal pain and constipation. Negative for blood in stool, diarrhea, nausea and vomiting.  Genitourinary: Negative for dysuria, frequency, hematuria and urgency.  Musculoskeletal: Positive for joint pain.  Negative for falls.  Neurological: Positive for weakness. Negative for dizziness, tremors, focal weakness and headaches.  Endo/Heme/Allergies: Does not bruise/bleed easily.  Psychiatric/Behavioral: Negative for depression. The patient does not have insomnia.    admits of snoring MEDICATIONS AT HOME:  Prior to Admission medications   Medication Sig Start Date End Date Taking? Authorizing Provider  AVONEX PEN 30 MCG/0.5ML AJKT Inject 30 mcg into the skin every Monday. 04/28/16  Yes [provider]  buPROPion (WELLBUTRIN XL) 300 MG 24 hr tablet Take 300 mg by mouth every morning.  05/28/16  Yes [provider]  clonazePAM (KLONOPIN) 1 MG tablet Take 1 tablet by mouth at bedtime.  07/12/13  Yes [provider]  DULoxetine (CYMBALTA) 60 MG capsule Take 60 mg by mouth every morning.  07/26/13  Yes [provider]  fluticasone furoate-vilanterol (BREO ELLIPTA) 100-25 MCG/INH AEPB Inhale 1 puff into the lungs daily. 01/05/16  Yes Lada, Satira Anis, MD  gabapentin (NEURONTIN) 600 MG tablet TAKE 1 TABLET (600 MG TOTAL) BY MOUTH 3 (THREE) TIMES DAILY. 08/07/16  Yes Lada, Satira Anis, MD  hydrochlorothiazide (MICROZIDE) 12.5 MG capsule TAKE ONE CAPSULE (12.5 MG TOTAL) BY MOUTH EVERY DAY (THIS REPLACES TRIAMTERENE/HCTZ) 08/07/16  Yes Lada, Satira Anis, MD  lidocaine-prilocaine (EMLA) cream Apply 1 application topically as needed. apply cream over the port site and then place small piece of saran wrap over the cream to protect your clothing 08/14/16  Yes Sindy Guadeloupe, MD  methylphenidate (RITALIN) 10 MG tablet Take 10 mg by mouth 2 (two) times daily.  01/23/15  Yes [provider]  Multiple Vitamin (MULTIVITAMIN WITH MINERALS) TABS tablet Take 1 tablet by mouth daily.   Yes [provider]  polyethylene glycol powder (GLYCOLAX/MIRALAX) powder Take 17 g by mouth daily. Mixed in water Patient taking differently: Take 17 g by mouth as needed. Mixed in water 03/11/16  Yes  Lada, Satira Anis, MD  potassium chloride (K-DUR) 10 MEQ tablet Take 1 tablet (10 mEq total) by mouth daily. 12/22/15  Yes Lada, Satira Anis, MD  QUEtiapine Fumarate (SEROQUEL XR) 150 MG 24 hr tablet 150 mg at bedtime.  08/14/16  Yes [provider]  solifenacin (VESICARE) 5 MG tablet Take 5 mg by mouth daily.   Yes [provider]  tiZANidine (ZANAFLEX) 4 MG tablet Take 4 mg by mouth 4 (four) times daily. TAKES FOR MS   Yes [provider]  topiramate (TOPAMAX) 50 MG tablet TAKE 1 TABLET (50 MG TOTAL) BY MOUTH 2 (TWO) TIMES DAILY. Patient taking differently: Take 50 mg by mouth daily.  08/07/16  Yes Lada, Satira Anis, MD  oxyCODONE (OXY IR/ROXICODONE) 5 MG immediate release tablet Take 1 tablet (5 mg total) by mouth every 6 (six) hours as needed for moderate pain or severe pain. 08/30/16   Vaughan Basta, MD      PHYSICAL EXAMINATION:   VITAL SIGNS:  Blood pressure (!) 188/97, pulse 80, temperature 98.9 F (37.2 C), temperature source Oral, resp. rate 18, height 5\' 3"  (1.6 m), weight 129.7 kg (286 lb), SpO2 99 %.  GENERAL:  66 y.o.-year-old patient lying in the bed with no acute distress. Port-A-Cath catheter in left upper chest EYES: Pupils equal, round, reactive to light and accommodation. No scleral icterus. Extraocular muscles intact.  HEENT: Head atraumatic, normocephalic. Oropharynx and nasopharynx clear.  NECK:  Supple, no jugular venous distention. No thyroid enlargement, no tenderness.  LUNGS: Normal breath sounds bilaterally, no wheezing, rales,rhonchi or crepitation. No use of accessory muscles of respiration.  CARDIOVASCULAR: S1, S2 normal. No murmurs, rubs, or gallops.  ABDOMEN: Soft, nontender, nondistended. Bowel sounds present. No organomegaly or mass.  EXTREMITIES: 3+ lower extremity and pedal edema, no cyanosis, or clubbing. Erythema involving left lower extremity with left above medial malleolus ulceration, not draining at present, increased warmth of  the skin, some erythematous area in right lower extremity and medial distal leg, few healing ulcerations, skin is dry and stretched out with some fissuring NEUROLOGIC: Cranial nerves II through XII are intact. Muscle strength 5/5 in all extremities. Sensation intact. Gait not checked.  PSYCHIATRIC: The patient is alert and oriented x 3.  SKIN: Erythema in bilateral lower extremities, more pronounced in left lower extremity, increased warmth of the skin as well as tenderness to palpation on the few ulcerations healing. No significant drainage .   LABORATORY PANEL:   CBC  Recent Labs Lab 10/11/16 1554  WBC 8.8  HGB 12.3  HCT 36.0  PLT 308  MCV 83.6  MCH 28.6  MCHC 34.2  RDW 16.1*   ------------------------------------------------------------------------------------------------------------------  Chemistries   Recent Labs Lab 10/11/16 1554  NA 136  K 3.5  CL 102  CO2 26  GLUCOSE 110*  BUN 23*  CREATININE 1.38*  CALCIUM 9.2  AST 28  ALT 20  ALKPHOS 74  BILITOT 0.5   ------------------------------------------------------------------------------------------------------------------  Cardiac Enzymes No results for input(s): TROPONINI in the last 168 hours. ------------------------------------------------------------------------------------------------------------------  RADIOLOGY: No results found.  EKG: Orders placed or performed during the hospital encounter of 07/25/16  . ED EKG  . ED EKG    IMPRESSION AND PLAN:  Active Problems:   Cellulitis of left lower extremity   Swelling of lower extremity   CKD (chronic kidney disease), stage III   Obstructive sleep apnea #1 . Bilateral lower extremity cellulitis, admit patient to medical floor, initiate her on cefazolin intravenously, get Doppler ultrasound to rule out DVT, place Unna boots bilaterally, get wound care #2. Obesity with a history of obstructive sleep apnea, not on CPAP, discussed this patient  extensively, explained to her that lower extremity swelling could be related to her obstructive sleep apnea, get echocardiogram #3. CK D stage III, followed with diuresis #4. Malignant essential hypertension with systolic blood pressure in the range of 190, initiate lisinopril, follow creatinine  All the records are reviewed and case discussed with ED provider. Management plans discussed with the patient, family and they are in agreement.  CODE STATUS: Code Status History    Date Active Date Inactive Code Status Order ID Comments User Context   08/28/2016 11:59 AM 08/30/2016  7:32 PM Full Code [id number]  Loletha Grayer, MD ED   12/18/2015  7:56 PM 12/19/2015  7:39 PM Full Code [id number]  Hower, Aaron Mose, MD ED       TOTAL TIME TAKING CARE OF THIS PATIENT: 50 minutes.    Theodoro Grist M.D on 10/11/2016 at 7:27  PM  Between 7am to 6pm - Pager - [phone] After 6pm go to [email or web address] - password [email or web address] [facility]  Office  [phone]  CC: Primary care physician; Arnetha Courser, MD

## 2016-10-11 NOTE — Patient Outreach (Signed)
Attempt made to contact pt for transition of care, ongoing follow up on recent SNF discharge 5/25, rehab admission after recent hospitalization 5/2-5/4 for cellulitis (left lower extremity), recurrent falls.  Unable to leave a voice message as message on pt's phone- voice message not set up yet, please try call later.    Plan:  RN CM to make another attempt later today.     Zara Chess.   [facility]  [phone]

## 2016-10-12 LAB — BASIC METABOLIC PANEL
Anion gap: 7 (ref 5–15)
BUN: 21 mg/dL — ABNORMAL HIGH (ref 6–20)
CO2: 27 mmol/L (ref 22–32)
Calcium: 8.7 mg/dL — ABNORMAL LOW (ref 8.9–10.3)
Chloride: 104 mmol/L (ref 101–111)
Creatinine, Ser: 1.4 mg/dL — ABNORMAL HIGH (ref 0.44–1.00)
GFR calc Af Amer: 45 mL/min — ABNORMAL LOW (ref >60)
GFR calc non Af Amer: 38 mL/min — ABNORMAL LOW (ref >60)
Glucose, Bld: 104 mg/dL — ABNORMAL HIGH (ref 65–99)
Potassium: 3.1 mmol/L — ABNORMAL LOW (ref 3.5–5.1)
Sodium: 138 mmol/L (ref 135–145)

## 2016-10-12 LAB — CBC
HCT: 34.3 % — ABNORMAL LOW (ref 35.0–47.0)
Hemoglobin: 11.6 g/dL — ABNORMAL LOW (ref 12.0–16.0)
MCH: 28.1 pg (ref 26.0–34.0)
MCHC: 33.7 g/dL (ref 32.0–36.0)
MCV: 83.4 fL (ref 80.0–100.0)
Platelets: 287 10*3/uL (ref 150–440)
RBC: 4.11 MIL/uL (ref 3.80–5.20)
RDW: 16 % — ABNORMAL HIGH (ref 11.5–14.5)
WBC: 7.3 10*3/uL (ref 3.6–11.0)

## 2016-10-12 LAB — TROPONIN I
Troponin I: <0.03 ng/mL (ref <0.03)
Troponin I: <0.03 ng/mL (ref <0.03)
Troponin I: <0.03 ng/mL (ref <0.03)

## 2016-10-12 LAB — GLUCOSE, CAPILLARY: Glucose-Capillary: 112 mg/dL — ABNORMAL HIGH (ref 65–99)

## 2016-10-12 LAB — TSH: TSH: 2.672 u[IU]/mL (ref 0.350–4.500)

## 2016-10-12 MED ORDER — CEFAZOLIN SODIUM-DEXTROSE 1-4 GM/50ML-% IV SOLN
1.0000 g | Freq: Three times a day (TID) | INTRAVENOUS | Status: DC
  Administered 2016-10-12 – 2016-10-14 (×7): 1 g via INTRAVENOUS
  Filled 2016-10-12 (×9): qty 50

## 2016-10-12 NOTE — Progress Notes (Signed)
[facility] [place] NAME: [name]    MR#:  [id number]  DATE OF BIRTH:  1950-06-04  SUBJECTIVE:  CHIEF COMPLAINT:   Chief Complaint  Patient presents with  . Leg Swelling   - admitted with bilateral leg swelling and cellulitis, sleepy this am - complaints of leg pain  REVIEW OF SYSTEMS:  Review of Systems  Constitutional: Positive for malaise/fatigue. Negative for chills and fever.  HENT: Negative for congestion, ear discharge, hearing loss and nosebleeds.   Eyes: Negative for blurred vision.  Respiratory: Negative for cough, shortness of breath and wheezing.   Cardiovascular: Positive for leg swelling. Negative for chest pain and palpitations.  Gastrointestinal: Negative for abdominal pain, constipation, diarrhea, nausea and vomiting.  Genitourinary: Negative for dysuria.  Musculoskeletal: Positive for myalgias.  Neurological: Negative for dizziness, seizures and headaches.    DRUG ALLERGIES:  No Known Allergies  VITALS:  Blood pressure 138/73, pulse 80, temperature 98.2 F (36.8 C), temperature source Oral, resp. rate 20, height 5\' 3"  (1.6 m), weight 135.7 kg (299 lb 1.6 oz), SpO2 99 %.  PHYSICAL EXAMINATION:  Physical Exam  GENERAL:  66 y.o.-year-old overweight patient lying in the bed with no acute distress.  EYES: Pupils equal, round, reactive to light and accommodation. No scleral icterus. Extraocular muscles intact.  HEENT: Head atraumatic, normocephalic. Oropharynx and nasopharynx clear.  NECK:  Supple, no jugular venous distention. No thyroid enlargement, no tenderness.  LUNGS: Normal breath sounds bilaterally, no wheezing, rales,rhonchi or crepitation. No use of accessory muscles of respiration. Decreased bibasilar breath sounds CARDIOVASCULAR: S1, S2 normal. No murmurs, rubs, or gallops.  ABDOMEN: Soft, nontender, nondistended. Bowel sounds present. No organomegaly or mass.  EXTREMITIES: No pedal edema, cyanosis,  or clubbing.  NEUROLOGIC: Cranial nerves II through XII are intact. Muscle strength 5/5 in all extremities. Sensation intact. Gait not checked. Global weakness noted. PSYCHIATRIC: The patient is very groggy, when stimulated, is alert and oriented x 2.  SKIN: No obvious rash, lesion, or ulcer.    LABORATORY PANEL:   CBC  Recent Labs Lab 10/12/16 0527  WBC 7.3  HGB 11.6*  HCT 34.3*  PLT 287   ------------------------------------------------------------------------------------------------------------------  Chemistries   Recent Labs Lab 10/11/16 1554 10/12/16 0527  NA 136 138  K 3.5 3.1*  CL 102 104  CO2 26 27  GLUCOSE 110* 104*  BUN 23* 21*  CREATININE 1.38* 1.40*  CALCIUM 9.2 8.7*  AST 28  --   ALT 20  --   ALKPHOS 74  --   BILITOT 0.5  --    ------------------------------------------------------------------------------------------------------------------  Cardiac Enzymes  Recent Labs Lab 10/12/16 1105  TROPONINI <0.03   ------------------------------------------------------------------------------------------------------------------  RADIOLOGY:  US Venous Img Lower Bilateral  Result Date: 10/11/2016 CLINICAL DATA:  Bilateral leg swelling and redness EXAM: BILATERAL LOWER EXTREMITY VENOUS DOPPLER ULTRASOUND TECHNIQUE: Gray-scale sonography with graded compression, as well as color Doppler and duplex ultrasound were performed to evaluate the lower extremity deep venous systems from the level of the common femoral vein and including the common femoral, femoral, profunda femoral, popliteal and calf veins including the posterior tibial, peroneal and gastrocnemius veins when visible. The superficial great saphenous vein was also interrogated. Spectral Doppler was utilized to evaluate flow at rest and with distal augmentation maneuvers in the common femoral, femoral and popliteal veins. COMPARISON:  08/28/2016 FINDINGS: RIGHT LOWER EXTREMITY Common Femoral Vein: No  evidence of thrombus. Normal compressibility, respiratory phasicity and response to augmentation. Saphenofemoral Junction: No  evidence of thrombus. Normal compressibility and flow on color Doppler imaging. Profunda Femoral Vein: No evidence of thrombus. Normal compressibility and flow on color Doppler imaging. Femoral Vein: No evidence of thrombus. Normal compressibility, respiratory phasicity and response to augmentation. Popliteal Vein: No evidence of thrombus. Normal compressibility, respiratory phasicity and response to augmentation. Calf Veins: No evidence of thrombus. Normal compressibility and flow on color Doppler imaging. Other Findings:  Soft tissue edema is present LEFT LOWER EXTREMITY Common Femoral Vein: No evidence of thrombus. Normal compressibility, respiratory phasicity and response to augmentation. Saphenofemoral Junction: No evidence of thrombus. Normal compressibility and flow on color Doppler imaging. Profunda Femoral Vein: No evidence of thrombus. Normal compressibility and flow on color Doppler imaging. Femoral Vein: No evidence of thrombus. Normal compressibility, respiratory phasicity and response to augmentation. Popliteal Vein: No evidence of thrombus. Normal compressibility, respiratory phasicity and response to augmentation. Calf Veins: No evidence of thrombus. Normal compressibility and flow on color Doppler imaging. Other Findings: Soft tissue edema is present. Again visualized is a cystic mass in the left groin measuring 5.9 x 4.5 x 4.9 cm, decreased compared to prior maximum measurement of 7 cm. IMPRESSION: No evidence of DVT within either lower extremity. Slight decrease in size of a cystic appearing mass in the left groin. Electronically Signed   By: Donavan Foil M.D.   On: 10/11/2016 21:39    EKG:   Orders placed or performed during the hospital encounter of 10/11/16  . EKG 12-Lead  . EKG 12-Lead  . EKG 12-Lead  . EKG 12-Lead    ASSESSMENT AND PLAN:   66 y/o F with  PMH of OSA not on CPAP, COPD, depression, follicular lymphoma, htn, obesity, obstructive and reflux uropathy, multiple sclerosis admitted with bilateral lower extremity cellulitis  #1 Bilateral lower extremity cellulitis- has chronic lower extremity edema - continue ancef for now Unna wraps and PT consult  #2 OSA- will need CPAP, has increased day time sleepiness - ECHO to r/o right heart failure as the cause of her lower extremity edema  #3 Depression and mood disorder- continue home meds Also on ritalin BID  #4 MS- on beta interferon  #5 B cell lymphoma- follows with cancer center with Dr. Janese Banks- on rituxan Last visit though was on 08/22/16  #6 DVT Prophylaxis- lovenox   PT consulted Social worker consulted   All the records are reviewed and case discussed with Care Management/Social Workerr. Management plans discussed with the patient, family and they are in agreement.  CODE STATUS: Full Code  TOTAL TIME TAKING CARE OF THIS PATIENT: 38 minutes.   POSSIBLE D/C IN 2 DAYS, DEPENDING ON CLINICAL CONDITION.   Gladstone Lighter M.D on 10/12/2016 at 4:17 PM  Between 7am to 6pm - Pager - [phone]  After 6pm go to [email or web address] - password [email or web address] [facility]  Office  [phone]  CC: Primary care physician; Arnetha Courser, MD

## 2016-10-12 NOTE — Evaluation (Signed)
Physical Therapy Evaluation Patient Details Name: [name] MRN: [id number] DOB: February 19, 1951 Today's Date: 10/12/2016   History of Present Illness  66 yo female with onset of LE cellulitis who was admitted to check out source, cleared DVT's.  PMHx:  foley, morbid obesity, MS, HTN, lymphoma, OSA, COPD, depression, ADHD, PVD, CKD 3  Clinical Impression  Pt is up in chair with her legs supported to get the edema under control as they are wrapped to compress and are somewhat uncomfortable.  Her plan is to transition to SNF after acute therapy is done with focus at hosp on standing tolerance and safety, increasing independence with gait and there exercise to increase her tolerance for the mobility.  Pt is motivated and talks about her need to move to more independent environment to allow her access to healthcare that she does not currently receive.    Follow Up Recommendations SNF    Equipment Recommendations  None recommended by PT    Recommendations for Other Services       Precautions / Restrictions Precautions Precautions: Fall Restrictions Weight Bearing Restrictions: No      Mobility  Bed Mobility Overal bed mobility: Needs Assistance Bed Mobility: Supine to Sit     Supine to sit: Mod assist     General bed mobility comments: significant help to support trunk and finish sliding to EOB  Transfers Overall transfer level: Needs assistance Equipment used: Rolling walker (2 wheeled);1 person hand held assist Transfers: Sit to/from Stand Sit to Stand: Mod assist            Ambulation/Gait             General Gait Details: pt is just transitioning to chair due to buckled appearance of gait  Stairs            Wheelchair Mobility    Modified Rankin (Stroke Patients Only)       Balance Overall balance assessment: Needs assistance Sitting-balance support: Feet supported Sitting balance-Leahy Scale: Good     Standing balance support: Bilateral upper  extremity supported Standing balance-Leahy Scale: Poor                               Pertinent Vitals/Pain Pain Assessment: Faces Faces Pain Scale: Hurts a little bit Pain Location: legs wtih mobility Pain Intervention(s): Monitored during session;Premedicated before session;Repositioned    Home Living Family/patient expects to be discharged to:: Private residence Living Arrangements: Spouse/significant other Available Help at Discharge: Family Type of Home: House Home Access: Ramped entrance     Home Layout: One level Home Equipment: Environmental consultant - 4 wheels;Bedside commode Additional Comments: pt reluctant to use RW but is not able to steady on Rollator    Prior Function Level of Independence: Needs assistance   Gait / Transfers Assistance Needed: rollator and BSC for limited mobility  ADL's / Homemaking Assistance Needed: lives with husband but not clear how much help he offers        Hand Dominance        Extremity/Trunk Assessment   Upper Extremity Assessment Upper Extremity Assessment: Overall WFL for tasks assessed    Lower Extremity Assessment Lower Extremity Assessment: Generalized weakness    Cervical / Trunk Assessment Cervical / Trunk Assessment: Normal  Communication   Communication: No difficulties  Cognition Arousal/Alertness: Awake/alert Behavior During Therapy: Agitated Overall Cognitive Status: Within Functional Limits for tasks assessed  General Comments      Exercises     Assessment/Plan    PT Assessment Patient needs continued PT services  PT Problem List Decreased strength;Decreased range of motion;Decreased activity tolerance;Decreased balance;Decreased mobility;Decreased coordination;Decreased knowledge of use of DME;Decreased safety awareness;Decreased knowledge of precautions;Cardiopulmonary status limiting activity;Obesity;Decreased skin integrity;Pain       PT  Treatment Interventions DME instruction;Gait training;Functional mobility training;Therapeutic activities;Therapeutic exercise;Balance training;Neuromuscular re-education;Patient/family education    PT Goals (Current goals can be found in the Care Plan section)  Acute Rehab PT Goals Patient Stated Goal: to get out on her own and get medicaid services PT Goal Formulation: With patient Time For Goal Achievement: 10/26/16 Potential to Achieve Goals: Good    Frequency Min 2X/week   Barriers to discharge Decreased caregiver support (pt does not report husband is supportive or helpful)      Co-evaluation               AM-PAC PT "6 Clicks" Daily Activity  Outcome Measure Difficulty turning over in bed (including adjusting bedclothes, sheets and blankets)?: Total Difficulty moving from lying on back to sitting on the side of the bed? : Total Difficulty sitting down on and standing up from a chair with arms (e.g., wheelchair, bedside commode, etc,.)?: Total Help needed moving to and from a bed to chair (including a wheelchair)?: A Lot Help needed walking in hospital room?: A Lot Help needed climbing 3-5 steps with a railing? : Total 6 Click Score: 8    End of Session Equipment Utilized During Treatment: Gait belt Activity Tolerance: Patient limited by fatigue Patient left: in chair;with call bell/phone within reach;with chair alarm set Nurse Communication: Mobility status PT Visit Diagnosis: Unsteadiness on feet (R26.81);Muscle weakness (generalized) (M62.81);Difficulty in walking, not elsewhere classified (R26.2)    Time: 0354-6568 PT Time Calculation (min) (ACUTE ONLY): 30 min   Charges:   PT Evaluation $PT Eval Moderate Complexity: 1 Procedure PT Treatments $Therapeutic Activity: 8-22 mins   PT G Codes:   PT G-Codes **NOT FOR INPATIENT CLASS** Functional Assessment Tool Used: AM-PAC 6 Clicks Basic Mobility    Ramond Dial 10/12/2016, 6:03 PM   Mee Hives, PT  MS Acute Rehab Dept. Number: [place] and [place]
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
## 2016-10-13 ENCOUNTER — Inpatient Hospital Stay (HOSPITAL_COMMUNITY)
Admit: 2016-10-13 | Discharge: 2016-10-13 | Disposition: A | Discharge status: Home / Self Care | Payer: Medicare Other | Attending: Internal Medicine | Admitting: Internal Medicine

## 2016-10-13 DIAGNOSIS — I509 Heart failure, unspecified: Secondary | ICD-10-CM

## 2016-10-13 LAB — CBC
HCT: 33.3 % — ABNORMAL LOW (ref 35.0–47.0)
Hemoglobin: 11.3 g/dL — ABNORMAL LOW (ref 12.0–16.0)
MCH: 28.4 pg (ref 26.0–34.0)
MCHC: 33.8 g/dL (ref 32.0–36.0)
MCV: 84 fL (ref 80.0–100.0)
Platelets: 261 10*3/uL (ref 150–440)
RBC: 3.96 MIL/uL (ref 3.80–5.20)
RDW: 16.2 % — ABNORMAL HIGH (ref 11.5–14.5)
WBC: 7 10*3/uL (ref 3.6–11.0)

## 2016-10-13 LAB — HEMOGLOBIN A1C
Hgb A1c MFr Bld: 5.5 % (ref 4.8–5.6)
Mean Plasma Glucose: 111 mg/dL

## 2016-10-13 LAB — BASIC METABOLIC PANEL
Anion gap: 6 (ref 5–15)
BUN: 21 mg/dL — ABNORMAL HIGH (ref 6–20)
CO2: 28 mmol/L (ref 22–32)
Calcium: 8.6 mg/dL — ABNORMAL LOW (ref 8.9–10.3)
Chloride: 105 mmol/L (ref 101–111)
Creatinine, Ser: 1.39 mg/dL — ABNORMAL HIGH (ref 0.44–1.00)
GFR calc Af Amer: 45 mL/min — ABNORMAL LOW (ref >60)
GFR calc non Af Amer: 39 mL/min — ABNORMAL LOW (ref >60)
Glucose, Bld: 100 mg/dL — ABNORMAL HIGH (ref 65–99)
Potassium: 3.5 mmol/L (ref 3.5–5.1)
Sodium: 139 mmol/L (ref 135–145)

## 2016-10-13 LAB — GLUCOSE, CAPILLARY: Glucose-Capillary: 104 mg/dL — ABNORMAL HIGH (ref 65–99)

## 2016-10-13 NOTE — Clinical Social Work Note (Addendum)
CSW visited the patient and her husband at bedside to discuss discharge planning. The CSW explained that PT has recommended STR, and further explained the referral process. The patient gave verbal permission to conduct the referral, and she seemed ambivalent about the placement. The patient has been at [facility] in the past and is not against returning. CSW has sent referral and bed offers are pending. The patient's PASRR will require manual review due to change in status and original PASRR noting limitation on SNF stays.  Santiago Bumpers, MSW, Latanya Presser [phone]

## 2016-10-13 NOTE — Progress Notes (Addendum)
Pnt appears to be resting no issues or concerns. Pnt currently asleep. No issues or concerns overnight. Pnt reported some BLE discomfort overnight, Tylenol given per pnt request. Pnt since pain medication resting and appears comfortable. Unna boots intact no changes in BLE appearance. Bed low, locked and call bell in reach. Will continue to monitor and assess.

## 2016-10-13 NOTE — Progress Notes (Signed)
[place] [facility] [place] NAME: [name]    MR#:  [id number]  DATE OF BIRTH:  1951/01/15  SUBJECTIVE:  CHIEF COMPLAINT:   Chief Complaint  Patient presents with  . Leg Swelling   - Cellulitis is improving, patient is feeling better and more alert today. -Social worker consult for SNF placement  REVIEW OF SYSTEMS:  Review of Systems  Constitutional: Positive for malaise/fatigue. Negative for chills and fever.  HENT: Negative for congestion, ear discharge, hearing loss and nosebleeds.   Eyes: Negative for blurred vision.  Respiratory: Negative for cough, shortness of breath and wheezing.   Cardiovascular: Positive for leg swelling. Negative for chest pain and palpitations.  Gastrointestinal: Negative for abdominal pain, constipation, diarrhea, nausea and vomiting.  Genitourinary: Negative for dysuria.  Musculoskeletal: Positive for myalgias.  Neurological: Negative for dizziness, seizures and headaches.    DRUG ALLERGIES:  No Known Allergies  VITALS:  Blood pressure (!) 126/106, pulse 70, temperature 98.1 F (36.7 C), temperature source Oral, resp. rate 20, height 5\' 3"  (1.6 m), weight 135.7 kg (299 lb 1.6 oz), SpO2 98 %.  PHYSICAL EXAMINATION:  Physical Exam  GENERAL:  66 y.o.-year-old overweight patient lying in the bed with no acute distress.  EYES: Pupils equal, round, reactive to light and accommodation. No scleral icterus. Extraocular muscles intact.  HEENT: Head atraumatic, normocephalic. Oropharynx and nasopharynx clear.  NECK:  Supple, no jugular venous distention. No thyroid enlargement, no tenderness.  LUNGS: Normal breath sounds bilaterally, no wheezing, rales,rhonchi or crepitation. No use of accessory muscles of respiration. Decreased bibasilar breath sounds CARDIOVASCULAR: S1, S2 normal. No murmurs, rubs, or gallops.  ABDOMEN: Soft, nontender, nondistended. Bowel sounds present. No organomegaly or mass.  EXTREMITIES:  No pedal edema, cyanosis, or clubbing.  NEUROLOGIC: Cranial nerves II through XII are intact. Muscle strength 5/5 in all extremities. Sensation intact. Gait not checked. Global weakness noted. PSYCHIATRIC: The patient is alert. alert and oriented x 2. Does not interact much SKIN: No obvious rash, lesion, or ulcer.    LABORATORY PANEL:   CBC  Recent Labs Lab 10/13/16 0405  WBC 7.0  HGB 11.3*  HCT 33.3*  PLT 261   ------------------------------------------------------------------------------------------------------------------  Chemistries   Recent Labs Lab 10/11/16 1554  10/13/16 0405  NA 136  < > 139  K 3.5  < > 3.5  CL 102  < > 105  CO2 26  < > 28  GLUCOSE 110*  < > 100*  BUN 23*  < > 21*  CREATININE 1.38*  < > 1.39*  CALCIUM 9.2  < > 8.6*  AST 28  --   --   ALT 20  --   --   ALKPHOS 74  --   --   BILITOT 0.5  --   --   < > = values in this interval not displayed. ------------------------------------------------------------------------------------------------------------------  Cardiac Enzymes  Recent Labs Lab 10/12/16 1105  TROPONINI <0.03   ------------------------------------------------------------------------------------------------------------------  RADIOLOGY:  US Venous Img Lower Bilateral  Result Date: 10/11/2016 CLINICAL DATA:  Bilateral leg swelling and redness EXAM: BILATERAL LOWER EXTREMITY VENOUS DOPPLER ULTRASOUND TECHNIQUE: Gray-scale sonography with graded compression, as well as color Doppler and duplex ultrasound were performed to evaluate the lower extremity deep venous systems from the level of the common femoral vein and including the common femoral, femoral, profunda femoral, popliteal and calf veins including the posterior tibial, peroneal and gastrocnemius veins when visible. The superficial great saphenous vein was also interrogated. Spectral  Doppler was utilized to evaluate flow at rest and with distal augmentation maneuvers in the  common femoral, femoral and popliteal veins. COMPARISON:  08/28/2016 FINDINGS: RIGHT LOWER EXTREMITY Common Femoral Vein: No evidence of thrombus. Normal compressibility, respiratory phasicity and response to augmentation. Saphenofemoral Junction: No evidence of thrombus. Normal compressibility and flow on color Doppler imaging. Profunda Femoral Vein: No evidence of thrombus. Normal compressibility and flow on color Doppler imaging. Femoral Vein: No evidence of thrombus. Normal compressibility, respiratory phasicity and response to augmentation. Popliteal Vein: No evidence of thrombus. Normal compressibility, respiratory phasicity and response to augmentation. Calf Veins: No evidence of thrombus. Normal compressibility and flow on color Doppler imaging. Other Findings:  Soft tissue edema is present LEFT LOWER EXTREMITY Common Femoral Vein: No evidence of thrombus. Normal compressibility, respiratory phasicity and response to augmentation. Saphenofemoral Junction: No evidence of thrombus. Normal compressibility and flow on color Doppler imaging. Profunda Femoral Vein: No evidence of thrombus. Normal compressibility and flow on color Doppler imaging. Femoral Vein: No evidence of thrombus. Normal compressibility, respiratory phasicity and response to augmentation. Popliteal Vein: No evidence of thrombus. Normal compressibility, respiratory phasicity and response to augmentation. Calf Veins: No evidence of thrombus. Normal compressibility and flow on color Doppler imaging. Other Findings: Soft tissue edema is present. Again visualized is a cystic mass in the left groin measuring 5.9 x 4.5 x 4.9 cm, decreased compared to prior maximum measurement of 7 cm. IMPRESSION: No evidence of DVT within either lower extremity. Slight decrease in size of a cystic appearing mass in the left groin. Electronically Signed   By: Donavan Foil M.D.   On: 10/11/2016 21:39    EKG:   Orders placed or performed during the hospital  encounter of 10/11/16  . EKG 12-Lead  . EKG 12-Lead  . EKG 12-Lead  . EKG 12-Lead    ASSESSMENT AND PLAN:   66 y/o F with PMH of OSA not on CPAP, COPD, depression, follicular lymphoma, htn, obesity, obstructive and reflux uropathy, multiple sclerosis admitted with bilateral lower extremity cellulitis  #1 Bilateral lower extremity cellulitis- has chronic lower extremity edema - continue ancef for now-discharge on oral antibiotics Unna wraps and PT consult  #2 OSA- will need CPAP, has increased day time sleepiness - ECHO to r/o right heart failure as the cause of her lower extremity edema-done and pending at this time  #3 Depression and mood disorder- continue home meds Also on ritalin BID  #4 MS- on beta interferon  #5 B cell lymphoma- follows with cancer center with Dr. Janese Banks- on rituxan Last visit though was on 08/22/16  #6 DVT Prophylaxis- lovenox   PT consulted Social worker consulted for SNF   All the records are reviewed and case discussed with Care Management/Social Workerr. Management plans discussed with the patient, family and they are in agreement.  CODE STATUS: Full Code  TOTAL TIME TAKING CARE OF THIS PATIENT: 36 minutes.   POSSIBLE D/C TOMORROW, DEPENDING ON CLINICAL CONDITION.   Gladstone Lighter M.D on 10/13/2016 at 12:59 PM  Between 7am to 6pm - Pager - [phone]  After 6pm go to [email or web address] - password [email or web address] [facility]  Office  [phone]  CC: Primary care physician; Arnetha Courser, MD

## 2016-10-13 NOTE — Progress Notes (Signed)
*  PRELIMINARY RESULTS* Echocardiogram 2D Echocardiogram has been performed.  [place] 10/13/2016, 11:01 AM

## 2016-10-14 ENCOUNTER — Other Ambulatory Visit: Payer: Self-pay | Admitting: *Deleted

## 2016-10-14 DIAGNOSIS — D6489 Other specified anemias: Secondary | ICD-10-CM | POA: Diagnosis not present

## 2016-10-14 DIAGNOSIS — I10 Essential (primary) hypertension: Secondary | ICD-10-CM | POA: Diagnosis not present

## 2016-10-14 DIAGNOSIS — F329 Major depressive disorder, single episode, unspecified: Secondary | ICD-10-CM | POA: Diagnosis not present

## 2016-10-14 DIAGNOSIS — M419 Scoliosis, unspecified: Secondary | ICD-10-CM | POA: Diagnosis not present

## 2016-10-14 DIAGNOSIS — F419 Anxiety disorder, unspecified: Secondary | ICD-10-CM | POA: Diagnosis not present

## 2016-10-14 DIAGNOSIS — N138 Other obstructive and reflux uropathy: Secondary | ICD-10-CM | POA: Diagnosis not present

## 2016-10-14 DIAGNOSIS — Z8619 Personal history of other infectious and parasitic diseases: Secondary | ICD-10-CM | POA: Diagnosis not present

## 2016-10-14 DIAGNOSIS — N183 Chronic kidney disease, stage 3 (moderate): Secondary | ICD-10-CM | POA: Diagnosis not present

## 2016-10-14 DIAGNOSIS — R5381 Other malaise: Secondary | ICD-10-CM | POA: Diagnosis not present

## 2016-10-14 DIAGNOSIS — L03116 Cellulitis of left lower limb: Secondary | ICD-10-CM | POA: Diagnosis not present

## 2016-10-14 DIAGNOSIS — M7989 Other specified soft tissue disorders: Secondary | ICD-10-CM | POA: Diagnosis not present

## 2016-10-14 DIAGNOSIS — C8210 Follicular lymphoma grade II, unspecified site: Secondary | ICD-10-CM | POA: Diagnosis not present

## 2016-10-14 DIAGNOSIS — C8300 Small cell B-cell lymphoma, unspecified site: Secondary | ICD-10-CM | POA: Diagnosis not present

## 2016-10-14 DIAGNOSIS — G4733 Obstructive sleep apnea (adult) (pediatric): Secondary | ICD-10-CM | POA: Diagnosis not present

## 2016-10-14 DIAGNOSIS — M6281 Muscle weakness (generalized): Secondary | ICD-10-CM | POA: Diagnosis not present

## 2016-10-14 DIAGNOSIS — I739 Peripheral vascular disease, unspecified: Secondary | ICD-10-CM | POA: Diagnosis not present

## 2016-10-14 DIAGNOSIS — G35 Multiple sclerosis: Secondary | ICD-10-CM | POA: Diagnosis not present

## 2016-10-14 DIAGNOSIS — K5900 Constipation, unspecified: Secondary | ICD-10-CM | POA: Diagnosis not present

## 2016-10-14 DIAGNOSIS — L03119 Cellulitis of unspecified part of limb: Secondary | ICD-10-CM | POA: Diagnosis not present

## 2016-10-14 DIAGNOSIS — Z87891 Personal history of nicotine dependence: Secondary | ICD-10-CM | POA: Diagnosis not present

## 2016-10-14 DIAGNOSIS — J449 Chronic obstructive pulmonary disease, unspecified: Secondary | ICD-10-CM | POA: Diagnosis not present

## 2016-10-14 DIAGNOSIS — F909 Attention-deficit hyperactivity disorder, unspecified type: Secondary | ICD-10-CM | POA: Diagnosis not present

## 2016-10-14 DIAGNOSIS — J984 Other disorders of lung: Secondary | ICD-10-CM | POA: Diagnosis not present

## 2016-10-14 DIAGNOSIS — L03115 Cellulitis of right lower limb: Secondary | ICD-10-CM | POA: Diagnosis not present

## 2016-10-14 DIAGNOSIS — C8580 Other specified types of non-Hodgkin lymphoma, unspecified site: Secondary | ICD-10-CM | POA: Diagnosis not present

## 2016-10-14 DIAGNOSIS — Z79899 Other long term (current) drug therapy: Secondary | ICD-10-CM | POA: Diagnosis not present

## 2016-10-14 LAB — ECHOCARDIOGRAM COMPLETE
Height: 63 in
Weight: 4785.6 oz

## 2016-10-14 LAB — BASIC METABOLIC PANEL
Anion gap: 6 (ref 5–15)
BUN: 20 mg/dL (ref 6–20)
CO2: 28 mmol/L (ref 22–32)
Calcium: 8.8 mg/dL — ABNORMAL LOW (ref 8.9–10.3)
Chloride: 104 mmol/L (ref 101–111)
Creatinine, Ser: 1.25 mg/dL — ABNORMAL HIGH (ref 0.44–1.00)
GFR calc Af Amer: 51 mL/min — ABNORMAL LOW (ref >60)
GFR calc non Af Amer: 44 mL/min — ABNORMAL LOW (ref >60)
Glucose, Bld: 94 mg/dL (ref 65–99)
Potassium: 3.3 mmol/L — ABNORMAL LOW (ref 3.5–5.1)
Sodium: 138 mmol/L (ref 135–145)

## 2016-10-14 LAB — GLUCOSE, CAPILLARY: Glucose-Capillary: 90 mg/dL (ref 65–99)

## 2016-10-14 MED ORDER — TIZANIDINE HCL 4 MG PO TABS: 4.0000 mg | ORAL_TABLET | Freq: Four times a day (QID) | ORAL | 0 refills | Status: DC

## 2016-10-14 MED ORDER — CLONAZEPAM 1 MG PO TABS: 1.0000 mg | ORAL_TABLET | Freq: Every day | ORAL | 0 refills | Status: DC

## 2016-10-14 MED ORDER — OXYCODONE HCL 5 MG PO TABS: 5.0000 mg | ORAL_TABLET | Freq: Four times a day (QID) | ORAL | 0 refills | Status: DC | PRN

## 2016-10-14 MED ORDER — METHYLPHENIDATE HCL 10 MG PO TABS: 10.0000 mg | ORAL_TABLET | Freq: Two times a day (BID) | ORAL | 0 refills | Status: DC

## 2016-10-14 MED ORDER — DULOXETINE HCL 60 MG PO CPEP: 60.0000 mg | ORAL_CAPSULE | ORAL | 3 refills | Status: AC

## 2016-10-14 MED ORDER — AMOXICILLIN-POT CLAVULANATE 875-125 MG PO TABS: 1.0000 | ORAL_TABLET | Freq: Two times a day (BID) | ORAL | 0 refills | Status: AC

## 2016-10-14 MED ORDER — HEPARIN SOD (PORK) LOCK FLUSH 100 UNIT/ML IV SOLN
500.0000 [IU] | Freq: Once | INTRAVENOUS | Status: AC
  Administered 2016-10-14: 500 [IU] via INTRAVENOUS
  Filled 2016-10-14: qty 5

## 2016-10-14 MED ORDER — LISINOPRIL 5 MG PO TABS
5.0000 mg | ORAL_TABLET | Freq: Every day | ORAL | 0 refills | Status: DC
Start: 2016-10-15 — End: 2016-11-13

## 2016-10-14 MED ORDER — TOPIRAMATE 50 MG PO TABS: 50.0000 mg | ORAL_TABLET | Freq: Every day | ORAL | 0 refills | Status: DC

## 2016-10-14 MED ORDER — SODIUM CHLORIDE 0.9 % IV SOLN: Freq: Once | INTRAVENOUS | Status: DC

## 2016-10-14 NOTE — Patient Outreach (Signed)
[facility]) Care Management  10/14/2016  [name] 07-06-1950 [id number]   Phone call from patient's family friend Geronimo Boot, verbal consent previously provided by patient to speak to [name] who expressed concern that patient will have to pay a daily co-pay while receiving SNF care. Per [name] she is wiling to increase care assistance in the home if she discharges home instead,   Plan: This Education officer, museum will follow up with patient in the nursing home to discuss follow up care post discharge from the SNF.   [name] [facility] Care Management [phone]

## 2016-10-14 NOTE — Care Management Important Message (Signed)
Important Message  Patient Details  Name: [name] MRN: [id number] Date of Birth: March 28, 1951   Medicare Important Message Given:  Yes    Beverly Sessions, RN 10/14/2016, 11:50 AM

## 2016-10-14 NOTE — Clinical Social Work Placement (Signed)
   CLINICAL SOCIAL WORK PLACEMENT  NOTE  Date:  10/14/2016  Patient Details  Name: [name] MRN: [id number] Date of Birth: [birth year]  Clinical Social Work is seeking post-discharge placement for this patient at the [place] level of care (*CSW will initial, date and re-position this form in  chart as items are completed):  Yes   Patient/family provided with [place] Work Department's list of facilities offering this level of care within the geographic area requested by the patient (or if unable, by the patient's family).  Yes   Patient/family informed of their freedom to choose among providers that offer the needed level of care, that participate in Medicare, Medicaid or managed care program needed by the patient, have an available bed and are willing to accept the patient.  Yes   Patient/family informed of [facility]'s ownership interest in [facility] and [facility], as well as of the fact that they are under no obligation to receive care at these facilities.  PASRR submitted to EDS on       PASRR number received on       Existing PASRR number confirmed on 10/14/16     FL2 transmitted to all facilities in geographic area requested by pt/family on 10/14/16     FL2 transmitted to all facilities within larger geographic area on       Patient informed that his/her managed care company has contracts with or will negotiate with certain facilities, including the following:        Yes   Patient/family informed of bed offers received.  Patient chooses bed at  [facility])     Physician recommends and patient chooses bed at  [facility])    Patient to be transferred to  [facility]) on 10/14/16.  Patient to be transferred to facility by  (husband)     Patient family notified on 10/14/16 of transfer.  Name of family member notified:   (husband)     PHYSICIAN       Additional Comment:     _______________________________________________ Shela Leff, LCSW 10/14/2016, 12:15 PM

## 2016-10-14 NOTE — NC FL2 (Signed)
[place] LEVEL OF CARE SCREENING TOOL     IDENTIFICATION  Patient Name: [name] Birthdate: April 04, 1951 Sex: female Admission Date (Current Location): 10/11/2016  [facility] and [place] Number:  Engineering geologist and Address:  [facility], [address] [place], [place] [address]      Provider Number: [id number]  Attending Physician Name and Address:  Vaughan Basta, *  Relative Name and Phone Number:       Current Level of Care: Hospital Recommended Level of Care: [place] Prior Approval Number:    Date Approved/Denied:   PASRR Number:    Discharge Plan: SNF    Current Diagnoses: Patient Active Problem List   Diagnosis Date Noted  . Cellulitis of left lower extremity 10/11/2016  . Swelling of lower extremity 10/11/2016  . CKD (chronic kidney disease), stage III 10/11/2016  . Obstructive sleep apnea 10/11/2016  . Malignant essential hypertension 10/11/2016  . Bilateral lower leg cellulitis 10/11/2016  . Follicular lymphoma of intra-abdominal lymph nodes ([place]) 08/06/2016  . Multiple falls 06/07/2016  . Inguinal adenopathy 05/29/2016  . Breast cancer screening 01/05/2016  . Morbid obesity ([place]) 01/05/2016  . Pressure ulcer 12/19/2015  . Low HDL (under 40) 12/19/2015  . Cellulitis 12/18/2015  . Skin ulcer ([place]) 11/08/2015  . Peripheral vascular disease of lower extremity with ulceration ([place]) 11/08/2015  . CKD (chronic kidney disease) stage 3, GFR 30-59 ml/min 11/08/2015  . Decubitus ulcer of left thigh, stage 2 04/18/2015  . Constipation due to pain medication 01/31/2015  . Obstructive sleep apnea of adult 01/13/2015  . Pelvic muscle wasting 01/13/2015  . Incomplete bladder emptying 01/13/2015  . Headache, migraine 10/24/2014  . 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 Current tobacco use 10/24/2014  . Lumbar radiculopathy, chronic 10/02/2013  . COPD with bronchial hyperresponsiveness ([place])  10/02/2013  . Major depressive disorder, recurrent, in partial remission ([place]) 10/02/2013  . Hypertension goal BP (blood pressure) < 140/90 10/02/2013  . Multiple sclerosis ([place]) 10/02/2013  . Absence of bladder continence 09/25/2012  . Acontractile bladder 02/12/2012  . Bladder retention 02/12/2012  . Narrowing of intervertebral disc space 11/26/2011  . Kyphoscoliosis and scoliosis 11/26/2011    Orientation RESPIRATION BLADDER Height & Weight     Self, Time, Situation, Place  Normal Continent Weight: 295 lb 9.6 oz (134.1 kg) Height:  5\' 3"  (160 cm)  BEHAVIORAL SYMPTOMS/MOOD NEUROLOGICAL BOWEL NUTRITION STATUS   (none)  (none) Continent Diet (2gm na)  AMBULATORY STATUS COMMUNICATION OF NEEDS Skin   Extensive Assist Verbally Normal                       Personal Care Assistance Level of Assistance  Bathing, Dressing Bathing Assistance: Limited assistance   Dressing Assistance: Limited assistance     Functional Limitations Info             SPECIAL CARE FACTORS FREQUENCY  PT (By licensed PT)                    Contractures Contractures Info: Not present    Additional Factors Info  Code Status, Allergies Code Status Info: full Allergies Info: nka           Current Medications (10/14/2016):  This is the current hospital active medication list Current Facility-Administered Medications  Medication Dose Route Frequency Provider Last Rate Last Dose  . 0.9 %  sodium chloride infusion  250 mL Intravenous PRN Theodoro Grist, MD      .  acetaminophen (TYLENOL) tablet 650 mg  650 mg Oral Q6H PRN Theodoro Grist, MD   650 mg at 10/12/16 2149   Or  . acetaminophen (TYLENOL) suppository 650 mg  650 mg Rectal Q6H PRN Theodoro Grist, MD      . buPROPion (WELLBUTRIN XL) 24 hr tablet 300 mg  300 mg Oral Sondra Come, Dorene Sorrow, MD   300 mg at 10/14/16 0603  . ceFAZolin (ANCEF) IVPB 1 g/50 mL premix  1 g Intravenous Q8H Harrie Foreman, MD 100 mL/hr at 10/14/16 0603 1 g  at 10/14/16 0603  . clonazePAM (KLONOPIN) tablet 1 mg  1 mg Oral QHS Theodoro Grist, MD   1 mg at 10/13/16 2239  . darifenacin (ENABLEX) 24 hr tablet 7.5 mg  7.5 mg Oral Daily Theodoro Grist, MD   7.5 mg at 10/13/16 0937  . DULoxetine (CYMBALTA) DR capsule 60 mg  60 mg Oral Sondra Come, Dorene Sorrow, MD   60 mg at 10/14/16 0603  . enoxaparin (LOVENOX) injection 40 mg  40 mg Subcutaneous BID Theodoro Grist, MD   40 mg at 10/13/16 2240  . fluticasone furoate-vilanterol (BREO ELLIPTA) 100-25 MCG/INH 1 puff  1 puff Inhalation Daily Theodoro Grist, MD   1 puff at 10/13/16 0934  . furosemide (LASIX) tablet 40 mg  40 mg Oral Daily Theodoro Grist, MD   40 mg at 10/13/16 0936  . gabapentin (NEURONTIN) tablet 600 mg  600 mg Oral TID Theodoro Grist, MD   600 mg at 10/13/16 2239  . Interferon Beta-1a AJKT 30 mcg  30 mcg Subcutaneous Q Charletta Cousin, [place], MD      . lisinopril (PRINIVIL,ZESTRIL) tablet 5 mg  5 mg Oral Daily Theodoro Grist, MD   5 mg at 10/13/16 0937  . methylphenidate (RITALIN) tablet 10 mg  10 mg Oral BID Theodoro Grist, MD   10 mg at 10/13/16 2238  . ondansetron (ZOFRAN) tablet 4 mg  4 mg Oral Q6H PRN Theodoro Grist, MD       Or  . ondansetron (ZOFRAN) injection 4 mg  4 mg Intravenous Q6H PRN Theodoro Grist, MD      . oxyCODONE (Oxy IR/ROXICODONE) immediate release tablet 5 mg  5 mg Oral Q6H PRN Theodoro Grist, MD   5 mg at 10/13/16 2239  . polyethylene glycol (MIRALAX / GLYCOLAX) packet 17 g  17 g Oral PRN Theodoro Grist, MD      . potassium chloride SA (K-DUR,KLOR-CON) CR tablet 20 mEq  20 mEq Oral BID Theodoro Grist, MD   20 mEq at 10/13/16 2238  . QUEtiapine (SEROQUEL XR) 24 hr tablet 150 mg  150 mg Oral QHS Theodoro Grist, MD   150 mg at 10/13/16 2238  . sodium chloride flush (NS) 0.9 % injection 3 mL  3 mL Intravenous Q12H Theodoro Grist, MD   3 mL at 10/13/16 2240  . sodium chloride flush (NS) 0.9 % injection 3 mL  3 mL Intravenous PRN Theodoro Grist, MD      . tiZANidine (ZANAFLEX)  tablet 4 mg  4 mg Oral QID Theodoro Grist, MD   4 mg at 10/13/16 2239  . topiramate (TOPAMAX) tablet 50 mg  50 mg Oral Daily Theodoro Grist, MD   50 mg at 10/13/16 4097     Discharge Medications: Please see discharge summary for a list of discharge medications.  Relevant Imaging Results:  Relevant Lab Results:   Additional Information ss: [id number]  Shela Leff, LCSW

## 2016-10-14 NOTE — Consult Note (Signed)
[place] Nurse wound consult note Reason for Consult:Cellulitis to bilateral lower extremities.  Has worn compression in the past.  Order to apply Unnas boots.  Has Unnas boots in place, applied yesterday.  Will not replace today.  Wound type: Infectious Pressure Injury POA: N/A Drainage (amount, consistency, odor) Minimal serous weeping. Periwound:erytehma and edema noted in toes.  Dressing procedure/placement/frequency:Cleanse bilateral lower legs with soap and water and pat dry. Apply zinc layer from below toes to below knees, secure with self adherent Coban.  Change twice weekly on Tuesday and Friday.  [place] team will follow.  [name] RN BSN [place] Pager [phone]

## 2016-10-14 NOTE — Progress Notes (Signed)
Port flushed and deaccessed per policy.  Patient tolerated well with no complications.

## 2016-10-14 NOTE — Care Management (Signed)
Patient to discharge to SNF.  CSW facilitating.  [place] with [facility] Notified of discharge disposition.

## 2016-10-14 NOTE — Discharge Summary (Signed)
[facility] [place] NAME: [name]    MR#:  [id number]  DATE OF BIRTH:  Jul 08, 1950  DATE OF ADMISSION:  10/11/2016 ADMITTING PHYSICIAN: Theodoro Grist, MD  DATE OF DISCHARGE: 10/14/2016   PRIMARY CARE PHYSICIAN: Arnetha Courser, MD    ADMISSION DIAGNOSIS:  Swelling [R60.9] Cellulitis of lower extremity, unspecified laterality [G29.528]  DISCHARGE DIAGNOSIS:  Active Problems:   Cellulitis of left lower extremity   Swelling of lower extremity   CKD (chronic kidney disease), stage III   Obstructive sleep apnea   Malignant essential hypertension   Bilateral lower leg cellulitis   SECONDARY DIAGNOSIS:   Past Medical History:  Diagnosis Date  . ADHD   . Anxiety   . COPD (chronic obstructive pulmonary disease) ([place])   . Depression    major depressive  . Dyspnea    doe  . Edema    left leg  . Follicular lymphoma ([place])    B Cell  . Hypertension   . Hypotension    idiopathic  . Kyphoscoliosis and scoliosis 11/26/2011  . Morbid obesity ([place]) 01/05/2016  . Multiple sclerosis ([place])   . Multiple sclerosis ([place])    1980's  . Neuromuscular disorder ([place])   . Obstructive and reflux uropathy    foley  . Pain    atypical facial  . Peripheral vascular disease of lower extremity with ulceration ([place]) 11/08/2015  . Skin ulcer ([place]) 11/08/2015  . Weakness    generalized. has MS    HOSPITAL COURSE:   66 y/o F with PMH of OSA not on CPAP, COPD, depression, follicular lymphoma, htn, obesity, obstructive and reflux uropathy, multiple sclerosis admitted with bilateral lower extremity cellulitis  #1 Bilateral lower extremity cellulitis- has chronic lower extremity edema - continue ancef for now-discharge on oral antibiotics Unna wraps and PT consult  #2 OSA- will need CPAP, has increased day time sleepiness - ECHO to r/o right heart failure as the cause of her lower extremity edema-done no significant findings.  #3  Depression and mood disorder- continue home meds Also on ritalin BID  #4 MS- on beta interferon  #5 B cell lymphoma- follows with cancer center with Dr. Janese Banks- on rituxan Last visit though was on 08/22/16  #6 DVT Prophylaxis- lovenox  DISCHARGE CONDITIONS:   Stable.  CONSULTS OBTAINED:    DRUG ALLERGIES:  No Known Allergies  DISCHARGE MEDICATIONS:   Current Discharge Medication List    START taking these medications   Details  amoxicillin-clavulanate (AUGMENTIN) 875-125 MG tablet Take 1 tablet by mouth 2 (two) times daily. Qty: 10 tablet, Refills: 0    lisinopril (PRINIVIL,ZESTRIL) 5 MG tablet Take 1 tablet (5 mg total) by mouth daily. Qty: 30 tablet, Refills: 0      CONTINUE these medications which have CHANGED   Details  topiramate (TOPAMAX) 50 MG tablet Take 1 tablet (50 mg total) by mouth daily. Qty: 30 tablet, Refills: 0      CONTINUE these medications which have NOT CHANGED   Details  AVONEX PEN 30 MCG/0.5ML AJKT Inject 30 mcg into the skin every Monday.    buPROPion (WELLBUTRIN XL) 300 MG 24 hr tablet Take 300 mg by mouth every morning.    Associated Diagnoses: Malignant lymphoma, undifferentiated cell, non-Burkitt's (HCC)    clonazePAM (KLONOPIN) 1 MG tablet Take 1 tablet by mouth at bedtime.     DULoxetine (CYMBALTA) 60 MG capsule Take 60 mg by mouth every morning.  fluticasone furoate-vilanterol (BREO ELLIPTA) 100-25 MCG/INH AEPB Inhale 1 puff into the lungs daily. Qty: 1 each, Refills: 11    gabapentin (NEURONTIN) 600 MG tablet TAKE 1 TABLET (600 MG TOTAL) BY MOUTH 3 (THREE) TIMES DAILY. Qty: 90 tablet, Refills: 2    hydrochlorothiazide (MICROZIDE) 12.5 MG capsule TAKE ONE CAPSULE (12.5 MG TOTAL) BY MOUTH EVERY DAY (THIS REPLACES TRIAMTERENE/HCTZ) Qty: 30 capsule, Refills: 5    lidocaine-prilocaine (EMLA) cream Apply 1 application topically as needed. apply cream over the port site and then place small piece of saran wrap over the cream to  protect your clothing Qty: 30 g, Refills: 1    methylphenidate (RITALIN) 10 MG tablet Take 10 mg by mouth 2 (two) times daily.  Refills: 0    Multiple Vitamin (MULTIVITAMIN WITH MINERALS) TABS tablet Take 1 tablet by mouth daily.    polyethylene glycol powder (GLYCOLAX/MIRALAX) powder Take 17 g by mouth daily. Mixed in water Qty: 850 g, Refills: 2   Associated Diagnoses: Constipation due to pain medication    potassium chloride (K-DUR) 10 MEQ tablet Take 1 tablet (10 mEq total) by mouth daily. Qty: 90 tablet, Refills: 1    QUEtiapine Fumarate (SEROQUEL XR) 150 MG 24 hr tablet 150 mg at bedtime.  Refills: 4   Associated Diagnoses: Follicular lymphoma of intra-abdominal lymph nodes, unspecified follicular lymphoma type ([place]); Visit for monitoring Rituxan therapy    solifenacin (VESICARE) 5 MG tablet Take 5 mg by mouth daily.    tiZANidine (ZANAFLEX) 4 MG tablet Take 4 mg by mouth 4 (four) times daily. TAKES FOR MS    oxyCODONE (OXY IR/ROXICODONE) 5 MG immediate release tablet Take 1 tablet (5 mg total) by mouth every 6 (six) hours as needed for moderate pain or severe pain. Qty: 20 tablet, Refills: 0         DISCHARGE INSTRUCTIONS:    Follow with PMD in 2 weeks and cancer center as advised in past by them.  If you experience worsening of your admission symptoms, develop shortness of breath, life threatening emergency, suicidal or homicidal thoughts you must seek medical attention immediately by calling 911 or calling your MD immediately  if symptoms less severe.  You Must read complete instructions/literature along with all the possible adverse reactions/side effects for all the Medicines you take and that have been prescribed to you. Take any new Medicines after you have completely understood and accept all the possible adverse reactions/side effects.   Please note  You were cared for by a hospitalist during your hospital stay. If you have any questions about your discharge  medications or the care you received while you were in the hospital after you are discharged, you can call the unit and asked to speak with the hospitalist on call if the hospitalist that took care of you is not available. Once you are discharged, your primary care physician will handle any further medical issues. Please note that NO REFILLS for any discharge medications will be authorized once you are discharged, as it is imperative that you return to your primary care physician (or establish a relationship with a primary care physician if you do not have one) for your aftercare needs so that they can reassess your need for medications and monitor your lab values.    Today   CHIEF COMPLAINT:   Chief Complaint  Patient presents with  . Leg Swelling    HISTORY OF PRESENT ILLNESS:  [name]  is a 66 y.o. female with a known history  of Obstructive sleep apnea, not on CPAP at home, COPD, depression, lymphoma, hypertension, morbid obesity, multiple sclerosis, obstructive and reflux uropathy status post chronic indwelling Foley catheter, who presented to the hospital with complaints of lower extremity swelling, redness and pressure. Lower extremities. According to the patient, she took antibiotic therapy, but her redness did not improve with. She has been having left lower extremity redness follow him while, however, her right leg and now is also erythematous for the past one week. She has a left above the medial malleolus ulceration, which is draining intermittently. She admits of history of obstructive sleep apnea, however, never get used to CPAP, so she does not wear one. She gets surprised when I tell her that her lower extremity swelling could be related to obstructive sleep apnea. No echocardiograms on the computer  VITAL SIGNS:  Blood pressure 138/70, pulse 60, temperature 97.8 F (36.6 C), temperature source Oral, resp. rate 18, height 5\' 3"  (1.6 m), weight 134.1 kg (295 lb 9.6 oz), SpO2 93  %.  I/O:   Intake/Output Summary (Last 24 hours) at 10/14/16 1142 Last data filed at 10/14/16 1056  Gross per 24 hour  Intake           575.81 ml  Output             2975 ml  Net         -2399.19 ml    PHYSICAL EXAMINATION:   GENERAL:  66 y.o.-year-old overweight patient lying in the bed with no acute distress.  EYES: Pupils equal, round, reactive to light and accommodation. No scleral icterus. Extraocular muscles intact.  HEENT: Head atraumatic, normocephalic. Oropharynx and nasopharynx clear.  NECK:  Supple, no jugular venous distention. No thyroid enlargement, no tenderness.  LUNGS: Normal breath sounds bilaterally, no wheezing, rales,rhonchi or crepitation. No use of accessory muscles of respiration. Decreased bibasilar breath sounds CARDIOVASCULAR: S1, S2 normal. No murmurs, rubs, or gallops.  ABDOMEN: Soft, nontender, nondistended. Bowel sounds present. No organomegaly or mass.  EXTREMITIES: No pedal edema, cyanosis, or clubbing.  NEUROLOGIC: Cranial nerves II through XII are intact. Muscle strength 5/5 in all extremities. Sensation intact. Gait not checked. Global weakness noted. PSYCHIATRIC: The patient is alert. alert and oriented x 2. Does not interact much SKIN: No obvious rash, lesion, or ulcer.   DATA REVIEW:   CBC  Recent Labs Lab 10/13/16 0405  WBC 7.0  HGB 11.3*  HCT 33.3*  PLT 261    Chemistries   Recent Labs Lab 10/11/16 1554  10/14/16 0411  NA 136  < > 138  K 3.5  < > 3.3*  CL 102  < > 104  CO2 26  < > 28  GLUCOSE 110*  < > 94  BUN 23*  < > 20  CREATININE 1.38*  < > 1.25*  CALCIUM 9.2  < > 8.8*  AST 28  --   --   ALT 20  --   --   ALKPHOS 74  --   --   BILITOT 0.5  --   --   < > = values in this interval not displayed.  Cardiac Enzymes  Recent Labs Lab 10/12/16 1105  TROPONINI <0.03    Microbiology Results  Results for orders placed or performed during the hospital encounter of 10/11/16  Blood culture (routine x 2)     Status:  None (Preliminary result)   Collection Time: 10/11/16  5:44 PM  Result Value Ref Range Status   Specimen Description BLOOD  BLOOD LEFT HAND  Final   Special Requests   Final    BOTTLES DRAWN AEROBIC AND ANAEROBIC Blood Culture adequate volume   Culture NO GROWTH 3 DAYS  Final   Report Status PENDING  Incomplete  Blood culture (routine x 2)     Status: None (Preliminary result)   Collection Time: 10/11/16  5:44 PM  Result Value Ref Range Status   Specimen Description BLOOD BLOOD RIGHT HAND  Final   Special Requests   Final    BOTTLES DRAWN AEROBIC AND ANAEROBIC Blood Culture adequate volume   Culture NO GROWTH 3 DAYS  Final   Report Status PENDING  Incomplete    RADIOLOGY:  No results found.  EKG:   Orders placed or performed during the hospital encounter of 10/11/16  . EKG 12-Lead  . EKG 12-Lead  . EKG 12-Lead  . EKG 12-Lead      Management plans discussed with the patient, family and they are in agreement.  CODE STATUS:     Code Status Orders        Start     Ordered   10/11/16 2320  Full code  Continuous     10/11/16 2319    Code Status History    Date Active Date Inactive Code Status Order ID Comments User Context   08/28/2016 11:59 AM 08/30/2016  7:32 PM Full Code [id number]  Loletha Grayer, MD ED   12/18/2015  7:56 PM 12/19/2015  7:39 PM Full Code [id number]  Hower, Aaron Mose, MD ED      TOTAL TIME TAKING CARE OF THIS PATIENT: 35 minutes.    Vaughan Basta M.D on 10/14/2016 at 11:42 AM  Between 7am to 6pm - Pager - [phone]  After 6pm go to [email or web address] - password [email or web address] [facility]  Office  [phone]  CC: Primary care physician; Arnetha Courser, MD   Note: This dictation was prepared with Dragon dictation along with smaller phrase technology. Any transcriptional errors that result from this process are unintentional.

## 2016-10-14 NOTE — Progress Notes (Signed)
Report called to [place] at [facility].  Patient will be discharged in her husbands car once her port is deaccessed and she is dressed

## 2016-10-14 NOTE — Progress Notes (Signed)
Discharge instructions reviewed with the patient.  Patient assisted into her car by me and the orderly

## 2016-10-14 NOTE — Clinical Social Work Note (Signed)
Pasrr received and bed offers extended to patient. Patient has chosen [facility] and physician is going to discharge patient today. Hilda Blades at [facility] is aware. Shela Leff MSW,LCSW [phone]

## 2016-10-15 ENCOUNTER — Other Ambulatory Visit: Payer: Self-pay | Admitting: *Deleted

## 2016-10-15 ENCOUNTER — Ambulatory Visit: Payer: Self-pay | Admitting: *Deleted

## 2016-10-15 NOTE — Patient Outreach (Signed)
[name]  10/15/2016  [name] 20-Aug-1950 [id number]   Phone call from [name] from Homecare providers informing this social worker that patient was now in  SNF care at [facility].  The 30 day in home care will be on hold until a discharge plan is in place.    [name] [facility] Care Management [phone]

## 2016-10-15 NOTE — Patient Outreach (Signed)
[name] [facility]) Care Management  10/15/2016  [name] [birth year] [id number]   Phone call to discharge planner-Deschala Pride of [facility] to discuss patient's discharge plan and to request invite to care plan meeting. Per the discharge planner, once the family care meeting is scheduled, she will give this social worker a call to attend to discuss discharge planning needs.   [name] [facility] Care Management [phone]

## 2016-10-15 NOTE — Patient Outreach (Signed)
[facility]) Care Management  10/15/2016  [name] [birth year] [id number]   Return call to [name] from the business office at [facility]. Voicemail message left requesting a return call.     [name] [facility] Care Management [phone]

## 2016-10-15 NOTE — Patient Outreach (Signed)
[facility]) Care Management  10/15/2016  [name] Mar 09, 1951 [id number]   Phone call from [place] discharge planner at [facility] stating that patient's initial care planning meeting is scheduled for 10/16/16 at 2 pm.  Patient's goals will be discussed at that time. This social worker unable to make this meeting, however will follow up with discharge planner during follow up SNF visit to discuss any concerns at that time.     [name] [facility] Care Management [phone]

## 2016-10-15 NOTE — Patient Outreach (Addendum)
[facility]) Care Management  10/15/2016  MOOREA BOISSONNEAULT June 03, 1950 [id number]   Phone call to [place]-Business office of [facility] to clarify patient's financial responsibility related to her stay. Per patient's family, patient may be in co-pay day's which is causing increased anxiety for patient.  Voicemail message left for return call.    [name] [facility] Care Management [phone]

## 2016-10-16 LAB — CULTURE, BLOOD (ROUTINE X 2)
Culture: NO GROWTH
Culture: NO GROWTH
Special Requests: ADEQUATE
Special Requests: ADEQUATE

## 2016-10-16 NOTE — Telephone Encounter (Signed)
This encounter was created in error - please disregard.

## 2016-10-17 ENCOUNTER — Inpatient Hospital Stay: Payer: Medicare Other

## 2016-10-17 ENCOUNTER — Encounter: Payer: Self-pay | Admitting: Oncology

## 2016-10-17 ENCOUNTER — Inpatient Hospital Stay: Payer: Medicare Other | Attending: Oncology

## 2016-10-17 ENCOUNTER — Inpatient Hospital Stay (HOSPITAL_BASED_OUTPATIENT_CLINIC_OR_DEPARTMENT_OTHER): Payer: Medicare Other | Admitting: Oncology

## 2016-10-17 VITALS — BP 131/79 | HR 61

## 2016-10-17 VITALS — BP 90/60 | HR 56 | Temp 95.8°F | Resp 18 | Wt 289.0 lb

## 2016-10-17 DIAGNOSIS — Z79899 Other long term (current) drug therapy: Secondary | ICD-10-CM | POA: Insufficient documentation

## 2016-10-17 DIAGNOSIS — K5900 Constipation, unspecified: Secondary | ICD-10-CM | POA: Insufficient documentation

## 2016-10-17 DIAGNOSIS — Z87891 Personal history of nicotine dependence: Secondary | ICD-10-CM

## 2016-10-17 DIAGNOSIS — J449 Chronic obstructive pulmonary disease, unspecified: Secondary | ICD-10-CM

## 2016-10-17 DIAGNOSIS — L03115 Cellulitis of right lower limb: Secondary | ICD-10-CM | POA: Diagnosis not present

## 2016-10-17 DIAGNOSIS — Z8619 Personal history of other infectious and parasitic diseases: Secondary | ICD-10-CM | POA: Diagnosis not present

## 2016-10-17 DIAGNOSIS — F419 Anxiety disorder, unspecified: Secondary | ICD-10-CM | POA: Insufficient documentation

## 2016-10-17 DIAGNOSIS — C8293 Follicular lymphoma, unspecified, intra-abdominal lymph nodes: Secondary | ICD-10-CM

## 2016-10-17 DIAGNOSIS — G35 Multiple sclerosis: Secondary | ICD-10-CM | POA: Diagnosis not present

## 2016-10-17 DIAGNOSIS — I739 Peripheral vascular disease, unspecified: Secondary | ICD-10-CM

## 2016-10-17 DIAGNOSIS — F909 Attention-deficit hyperactivity disorder, unspecified type: Secondary | ICD-10-CM

## 2016-10-17 DIAGNOSIS — F329 Major depressive disorder, single episode, unspecified: Secondary | ICD-10-CM

## 2016-10-17 DIAGNOSIS — M419 Scoliosis, unspecified: Secondary | ICD-10-CM | POA: Insufficient documentation

## 2016-10-17 DIAGNOSIS — M7989 Other specified soft tissue disorders: Secondary | ICD-10-CM | POA: Diagnosis not present

## 2016-10-17 DIAGNOSIS — I10 Essential (primary) hypertension: Secondary | ICD-10-CM | POA: Diagnosis not present

## 2016-10-17 DIAGNOSIS — C8210 Follicular lymphoma grade II, unspecified site: Secondary | ICD-10-CM | POA: Diagnosis not present

## 2016-10-17 DIAGNOSIS — Z5181 Encounter for therapeutic drug level monitoring: Secondary | ICD-10-CM

## 2016-10-17 DIAGNOSIS — Z79620 Long term (current) use of immunosuppressive biologic: Secondary | ICD-10-CM

## 2016-10-17 LAB — CBC WITH DIFFERENTIAL/PLATELET
Basophils Absolute: 0.1 10*3/uL (ref 0–0.1)
Basophils Relative: 1 %
Eosinophils Absolute: 0.4 10*3/uL (ref 0–0.7)
Eosinophils Relative: 5 %
HCT: 36.2 % (ref 35.0–47.0)
Hemoglobin: 12.3 g/dL (ref 12.0–16.0)
Lymphocytes Relative: 11 %
Lymphs Abs: 0.9 10*3/uL — ABNORMAL LOW (ref 1.0–3.6)
MCH: 28.7 pg (ref 26.0–34.0)
MCHC: 34.1 g/dL (ref 32.0–36.0)
MCV: 84.2 fL (ref 80.0–100.0)
Monocytes Absolute: 0.6 10*3/uL (ref 0.2–0.9)
Monocytes Relative: 7 %
Neutro Abs: 6.5 10*3/uL (ref 1.4–6.5)
Neutrophils Relative %: 76 %
Platelets: 279 10*3/uL (ref 150–440)
RBC: 4.3 MIL/uL (ref 3.80–5.20)
RDW: 15.8 % — ABNORMAL HIGH (ref 11.5–14.5)
WBC: 8.6 10*3/uL (ref 3.6–11.0)

## 2016-10-17 LAB — COMPREHENSIVE METABOLIC PANEL
ALT: 22 U/L (ref 14–54)
AST: 34 U/L (ref 15–41)
Albumin: 3.8 g/dL (ref 3.5–5.0)
Alkaline Phosphatase: 60 U/L (ref 38–126)
Anion gap: 6 (ref 5–15)
BUN: 17 mg/dL (ref 6–20)
CO2: 28 mmol/L (ref 22–32)
Calcium: 9.1 mg/dL (ref 8.9–10.3)
Chloride: 102 mmol/L (ref 101–111)
Creatinine, Ser: 1.24 mg/dL — ABNORMAL HIGH (ref 0.44–1.00)
GFR calc Af Amer: 52 mL/min — ABNORMAL LOW (ref >60)
GFR calc non Af Amer: 45 mL/min — ABNORMAL LOW (ref >60)
Glucose, Bld: 118 mg/dL — ABNORMAL HIGH (ref 65–99)
Potassium: 3.4 mmol/L — ABNORMAL LOW (ref 3.5–5.1)
Sodium: 136 mmol/L (ref 135–145)
Total Bilirubin: 0.5 mg/dL (ref 0.3–1.2)
Total Protein: 7.3 g/dL (ref 6.5–8.1)

## 2016-10-17 MED ORDER — HEPARIN SOD (PORK) LOCK FLUSH 100 UNIT/ML IV SOLN
INTRAVENOUS | Status: AC
  Filled 2016-10-17: qty 5

## 2016-10-17 MED ORDER — SODIUM CHLORIDE 0.9% FLUSH
10.0000 mL | INTRAVENOUS | Status: DC | PRN
  Filled 2016-10-17: qty 10

## 2016-10-17 MED ORDER — HEPARIN SOD (PORK) LOCK FLUSH 100 UNIT/ML IV SOLN
500.0000 [IU] | Freq: Once | INTRAVENOUS | Status: AC
  Administered 2016-10-17: 500 [IU] via INTRAVENOUS

## 2016-10-17 MED ORDER — ACETAMINOPHEN 325 MG PO TABS
650.0000 mg | ORAL_TABLET | Freq: Once | ORAL | Status: AC
  Administered 2016-10-17: 650 mg via ORAL
  Filled 2016-10-17: qty 2

## 2016-10-17 MED ORDER — SODIUM CHLORIDE 0.9 % IV SOLN: 375.0000 mg/m2 | Freq: Once | INTRAVENOUS | Status: DC

## 2016-10-17 MED ORDER — DIPHENHYDRAMINE HCL 25 MG PO CAPS
50.0000 mg | ORAL_CAPSULE | Freq: Once | ORAL | Status: AC
  Administered 2016-10-17: 50 mg via ORAL
  Filled 2016-10-17: qty 2

## 2016-10-17 MED ORDER — SODIUM CHLORIDE 0.9 % IV SOLN
375.0000 mg/m2 | Freq: Once | INTRAVENOUS | Status: AC
  Administered 2016-10-17: 900 mg via INTRAVENOUS
  Filled 2016-10-17: qty 50

## 2016-10-17 MED ORDER — SODIUM CHLORIDE 0.9 % IV SOLN
Freq: Once | INTRAVENOUS | Status: AC
  Administered 2016-10-17: 12:00:00 via INTRAVENOUS
  Filled 2016-10-17: qty 1000

## 2016-10-17 NOTE — Progress Notes (Signed)
Hematology/Oncology Consult note [facility]  Telephone:([phone] [phone]  Patient Care Team: Arnetha Courser, MD as PCP - General (Family Medicine) Sanda Klein, Satira Anis, MD as Attending Physician (Family Medicine) Christene Lye, MD (General Surgery) Lyman Speller, RN as [place], [place], [place] as [place] [facility]   Name of the patient: [name]  [id number]  July 29, 1950   Date of visit: 10/17/16  Diagnosis- Stage II follicular lymphoma  Chief complaint/ Reason for visit- on treatment assessment prior to cycle 3 of rituxan  Heme/Onc history: 1. patient is a 66 year old female who has had ongoing problems with pain in her left groin as well as his left leg swelling.   2. She had a Doppler of her left lower extremity on 02/28/2016 which showed: 1. Mild to moderate edema within the left calf. No evidence for acute DVT from left groin to popliteal fossa. Slightly limited evaluation of calf veins due to edema. 2. Left inguinal enlarged lymph nodes up to 6.2 cm, findings could be secondary to reactive node, infection, inflammation, or lymphoproliferative disease.  3. She had a repeat Doppler on 05/24/2016 which showed: IMPRESSION: 1. No evidence of lower extremity deep vein thrombosis, left. 2. Left inguinal lymphadenopathy. Differential diagnosis: Reactive adenopathy, lymphoma, metastatic disease.  4. CT abdomen on 06/14/2016 showed:  IMPRESSION: Lymphadenopathy within the left inguinal region and the left iliac chain. This is highly suspicious for underlying neoplasm. Biopsy is recommended for further evaluation.  5. Patient was seen by Dr. Jamal Collin from surgery and underwent excisional biopsy of the leftinguinal lymph node under ultrasound on 07/04/2016. Final pathology showed DIAGNOSIS:  A-B. LYMPH NODE, LEFT INGUINAL; EXCISION:  - FOLLICULAR LYMPHOMA,  GRADE 1-2, PREDOMINANTLY FOLLICULAR.   6. Patient has multiple sclerosis and has h/o depression and anxiety. She was given clonopin this morning and currently patient is drowsy but able to be aroused. She drifts off to sleep in between conversation. She is here with her friend who helps her out. Patient lives with her husband at baseline and uses a walker to ambulate. Reports pain and discomfort in her left groin. Denies fevers, chills, night sweats or weight loss  7. PET/CT scan from 07/16/16 showed: IMPRESSION: 1. Pathologic and hypermetabolic lower retroperitoneal, pelvic, and inguinal adenopathy compatible with Deauville 5 malignancy. 2. Mildly prominent right lower paratracheal lymph node, Deauville 4. 3. Accentuated activity in the right lateral pterygoid muscle without CT correlate, probably incidental, merits attention on any follow up. 4. Other imaging findings of potential clinical significance: Airway thickening is present, suggesting bronchitis or reactive airways disease. Trace left pleural effusion. Coronary and aortoiliac atherosclerosis. Prominent stool throughout the colon favors constipation. Late phase healing fractures of left lower anterior ribs.  8. Bone marrow biopsy from 07/18/2016 did not show any evidence of lymphoma. HIV and hepatitis B and hepatitis C testing was negative  9. Patient received 2 weekly doses of Rituxan on 4/19 and 08/22/2016. She did not follow-up since then. In the interim she developed herpes zoster involving the right abdominal wall. She then developed cellulitis of RLE She is currently in rehabilitation   Interval history- feels better over last couple of weeks. Herpes rash has healed  ECOG PS- 3 Pain scale- 0   Review of systems- Review of Systems  Constitutional: Negative for chills, fever, malaise/fatigue and weight loss.  HENT: Negative for congestion, ear discharge and nosebleeds.   Eyes: Negative for blurred vision.  Respiratory: Negative for cough, hemoptysis, sputum production, shortness of breath and wheezing.   Cardiovascular: Negative for chest pain, palpitations, orthopnea and claudication.  Gastrointestinal: Negative for abdominal pain, blood in stool, constipation, diarrhea, heartburn, melena, nausea and vomiting.  Genitourinary: Negative for dysuria, flank pain, frequency, hematuria and urgency.  Musculoskeletal: Negative for back pain, joint pain and myalgias.  Skin: Negative for rash.  Neurological: Negative for dizziness, tingling, focal weakness, seizures, weakness and headaches.  Endo/Heme/Allergies: Does not bruise/bleed easily.  Psychiatric/Behavioral: Negative for depression and suicidal ideas. The patient does not have insomnia.        No Known Allergies   Past Medical History:  Diagnosis Date  . ADHD   . Anxiety   . COPD (chronic obstructive pulmonary disease) ([place])   . Depression    major depressive  . Dyspnea    doe  . Edema    left leg  . Follicular lymphoma ([place])    B Cell  . Hypertension   . Hypotension    idiopathic  . Kyphoscoliosis and scoliosis 11/26/2011  . Morbid obesity ([place]) 01/05/2016  . Multiple sclerosis ([place])   . Multiple sclerosis ([place])    1980's  . Neuromuscular disorder ([place])   . Obstructive and reflux uropathy    foley  . Pain    atypical facial  . Peripheral vascular disease of lower extremity with ulceration ([place]) 11/08/2015  . Skin ulcer ([place]) 11/08/2015  . Weakness    generalized. has MS     Past Surgical History:  Procedure Laterality Date  . BACK SURGERY N/A 2002  . CYST EXCISION     lower back  . INGUINAL LYMPH NODE BIOPSY Left 07/04/2016   Procedure: INGUINAL LYMPH NODE BIOPSY;  Surgeon: Christene Lye, MD;  Location: [facility] ORS;  Service: General;  Laterality: Left;  . PORTACATH PLACEMENT N/A 07/22/2016   Procedure: INSERTION PORT-A-CATH;  Surgeon: Christene Lye, MD;  Location: [facility] ORS;  Service: General;   Laterality: N/A;  . TONSILLECTOMY AND ADENOIDECTOMY    . TUBAL LIGATION      Social History   Social History  . Marital status: Married    Spouse name: N/A  . Number of children: 3  . Years of education: N/A   Occupational History  . disabled    Social History Main Topics  . Smoking status: Former Smoker    Packs/day: 1.00    Years: 20.00    Types: Cigarettes    Quit date: 02/03/2016  . Smokeless tobacco: Never Used  . Alcohol use No  . Drug use: Yes    Types: Marijuana     Comment: smokes THC daily per pt   . Sexual activity: No   Other Topics Concern  . Not on file   Social History Narrative  . No narrative on file    Family History  Problem Relation Age of Onset  . COPD Mother   . Diabetes Mother   . Heart failure Mother   . Alcohol abuse Father   . Kidney disease Father   . Kidney failure Father   . Arthritis Sister   . CAD Maternal Grandmother   . Stroke Maternal Grandfather   . Diabetes Sister   . Arthritis Sister   . Arthritis Brother      Current Outpatient Prescriptions:  .  amoxicillin-clavulanate (AUGMENTIN) 875-125 MG tablet, Take 1 tablet by mouth 2 (two) times daily., Disp: 10 tablet, Rfl: 0 .  AVONEX PEN 30 MCG/0.5ML AJKT, Inject 30 mcg  into the skin every Monday., Disp: , Rfl:  .  buPROPion (WELLBUTRIN XL) 300 MG 24 hr tablet, Take 300 mg by mouth every morning. , Disp: , Rfl:  .  clonazePAM (KLONOPIN) 1 MG tablet, Take 1 tablet (1 mg total) by mouth at bedtime., Disp: 30 tablet, Rfl: 0 .  DULoxetine (CYMBALTA) 60 MG capsule, Take 1 capsule (60 mg total) by mouth every morning., Disp: 30 capsule, Rfl: 3 .  fluticasone furoate-vilanterol (BREO ELLIPTA) 100-25 MCG/INH AEPB, Inhale 1 puff into the lungs daily., Disp: 1 each, Rfl: 11 .  gabapentin (NEURONTIN) 600 MG tablet, TAKE 1 TABLET (600 MG TOTAL) BY MOUTH 3 (THREE) TIMES DAILY., Disp: 90 tablet, Rfl: 2 .  hydrochlorothiazide (MICROZIDE) 12.5 MG capsule, TAKE ONE CAPSULE (12.5 MG TOTAL) BY  MOUTH EVERY DAY (THIS REPLACES TRIAMTERENE/HCTZ), Disp: 30 capsule, Rfl: 5 .  lisinopril (PRINIVIL,ZESTRIL) 5 MG tablet, Take 1 tablet (5 mg total) by mouth daily., Disp: 30 tablet, Rfl: 0 .  methylphenidate (RITALIN) 10 MG tablet, Take 1 tablet (10 mg total) by mouth 2 (two) times daily., Disp: 30 tablet, Rfl: 0 .  Multiple Vitamin (MULTIVITAMIN WITH MINERALS) TABS tablet, Take 1 tablet by mouth daily., Disp: , Rfl:  .  potassium chloride (K-DUR) 10 MEQ tablet, Take 1 tablet (10 mEq total) by mouth daily., Disp: 90 tablet, Rfl: 1 .  QUEtiapine Fumarate (SEROQUEL XR) 150 MG 24 hr tablet, 150 mg at bedtime. , Disp: , Rfl: 4 .  tiZANidine (ZANAFLEX) 4 MG tablet, Take 1 tablet (4 mg total) by mouth 4 (four) times daily. TAKES FOR MS, Disp: 30 tablet, Rfl: 0 .  topiramate (TOPAMAX) 50 MG tablet, Take 1 tablet (50 mg total) by mouth daily., Disp: 30 tablet, Rfl: 0 .  lidocaine-prilocaine (EMLA) cream, Apply 1 application topically as needed. apply cream over the port site and then place small piece of saran wrap over the cream to protect your clothing (Patient not taking: Reported on 10/17/2016), Disp: 30 g, Rfl: 1 .  oxyCODONE (OXY IR/ROXICODONE) 5 MG immediate release tablet, Take 1 tablet (5 mg total) by mouth every 6 (six) hours as needed for moderate pain or severe pain. (Patient not taking: Reported on 10/17/2016), Disp: 20 tablet, Rfl: 0 .  polyethylene glycol powder (GLYCOLAX/MIRALAX) powder, Take 17 g by mouth daily. Mixed in water (Patient not taking: Reported on 10/17/2016), Disp: 850 g, Rfl: 2 .  solifenacin (VESICARE) 5 MG tablet, Take 5 mg by mouth daily., Disp: , Rfl:  No current facility-administered medications for this visit.   Facility-Administered Medications Ordered in Other Visits:  .  sodium chloride flush (NS) 0.9 % injection 10 mL, 10 mL, Intravenous, PRN, Sindy Guadeloupe, MD  Physical exam:  Vitals:   10/17/16 1055  BP: 90/60  Pulse: (!) 56  Resp: 18  Temp: (!) 95.8 F (35.4  C)  TempSrc: Tympanic  Weight: 289 lb (131.1 kg)   Physical Exam  Constitutional: She is oriented to person, place, and time.  She is obese, sitting in a wheelchair  HENT:  Head: Normocephalic and atraumatic.  Eyes: EOM are normal. Pupils are equal, round, and reactive to light.  Neck: Normal range of motion.  Cardiovascular: Normal rate, regular rhythm and normal heart sounds.   Pulmonary/Chest: Effort normal and breath sounds normal.  Abdominal: Soft. Bowel sounds are normal.  Groin rash +  Lymphadenopathy:  Difficult to assess inguinal adenopathy but it persists and likely a little smaller  Neurological: She is alert and oriented  to person, place, and time.  Skin: Skin is warm and dry.  Lesions of herpes zoster have healed. Foley catheter in place     CMP Latest Ref Rng & Units 10/17/2016  Glucose 65 - 99 mg/dL 118(H)  BUN 6 - 20 mg/dL 17  Creatinine 0.44 - 1.00 mg/dL 1.24(H)  Sodium 135 - 145 mmol/L 136  Potassium 3.5 - 5.1 mmol/L 3.4(L)  Chloride 101 - 111 mmol/L 102  CO2 22 - 32 mmol/L 28  Calcium 8.9 - 10.3 mg/dL 9.1  Total Protein 6.5 - 8.1 g/dL 7.3  Total Bilirubin 0.3 - 1.2 mg/dL 0.5  Alkaline Phos 38 - 126 U/L 60  AST 15 - 41 U/L 34  ALT 14 - 54 U/L 22   CBC Latest Ref Rng & Units 10/17/2016  WBC 3.6 - 11.0 K/uL 8.6  Hemoglobin 12.0 - 16.0 g/dL 12.3  Hematocrit 35.0 - 47.0 % 36.2  Platelets 150 - 440 K/uL 279    No images are attached to the encounter.  US Venous Img Lower Bilateral  Result Date: 10/11/2016 CLINICAL DATA:  Bilateral leg swelling and redness EXAM: BILATERAL LOWER EXTREMITY VENOUS DOPPLER ULTRASOUND TECHNIQUE: Gray-scale sonography with graded compression, as well as color Doppler and duplex ultrasound were performed to evaluate the lower extremity deep venous systems from the level of the common femoral vein and including the common femoral, femoral, profunda femoral, popliteal and calf veins including the posterior tibial, peroneal and  gastrocnemius veins when visible. The superficial great saphenous vein was also interrogated. Spectral Doppler was utilized to evaluate flow at rest and with distal augmentation maneuvers in the common femoral, femoral and popliteal veins. COMPARISON:  08/28/2016 FINDINGS: RIGHT LOWER EXTREMITY Common Femoral Vein: No evidence of thrombus. Normal compressibility, respiratory phasicity and response to augmentation. Saphenofemoral Junction: No evidence of thrombus. Normal compressibility and flow on color Doppler imaging. Profunda Femoral Vein: No evidence of thrombus. Normal compressibility and flow on color Doppler imaging. Femoral Vein: No evidence of thrombus. Normal compressibility, respiratory phasicity and response to augmentation. Popliteal Vein: No evidence of thrombus. Normal compressibility, respiratory phasicity and response to augmentation. Calf Veins: No evidence of thrombus. Normal compressibility and flow on color Doppler imaging. Other Findings:  Soft tissue edema is present LEFT LOWER EXTREMITY Common Femoral Vein: No evidence of thrombus. Normal compressibility, respiratory phasicity and response to augmentation. Saphenofemoral Junction: No evidence of thrombus. Normal compressibility and flow on color Doppler imaging. Profunda Femoral Vein: No evidence of thrombus. Normal compressibility and flow on color Doppler imaging. Femoral Vein: No evidence of thrombus. Normal compressibility, respiratory phasicity and response to augmentation. Popliteal Vein: No evidence of thrombus. Normal compressibility, respiratory phasicity and response to augmentation. Calf Veins: No evidence of thrombus. Normal compressibility and flow on color Doppler imaging. Other Findings: Soft tissue edema is present. Again visualized is a cystic mass in the left groin measuring 5.9 x 4.5 x 4.9 cm, decreased compared to prior maximum measurement of 7 cm. IMPRESSION: No evidence of DVT within either lower extremity. Slight  decrease in size of a cystic appearing mass in the left groin. Electronically Signed   By: Donavan Foil M.D.   On: 10/11/2016 21:39     Assessment and plan- Patient is a 66 y.o. female with a h/o Stage II follicular lymphoma s/p 2 weekly doses of rituxan  It has been almost 2 months since patient received 2 doses of rituxan. I will plan to restart rituxan at this point. I will give her 2 more  doses and repeat CT abdomen. If she responds, will switch her to maintenance rituxan at that point. If adenopathy persists, I will consider Rad Onc referral at that time  I will see ehr back in 3 weeks time. CT scan prior  She does have groin rash which appears fungal- I have asked her to use topical miconazole/ nystatin   Visit Diagnosis 1. Follicular lymphoma of intra-abdominal lymph nodes, unspecified follicular lymphoma type ([place])   2. Encounter for monitoring rituximab therapy      Dr. Randa Evens, MD, MPH [facility] Pager- [phone] 10/17/2016 3:13 PM

## 2016-10-17 NOTE — Progress Notes (Signed)
Here for follow up

## 2016-10-18 ENCOUNTER — Other Ambulatory Visit: Payer: Self-pay | Admitting: *Deleted

## 2016-10-18 NOTE — Patient Outreach (Addendum)
[facility]) Care Management  [facility] Social Work  10/18/2016  CHRISTEN WARDROP [birth year] [id number]  Subjective:  Patient os a 66 year old female, currently in [facility] for skilled care. Per patient, she did have the care plan meeting last Wednesday. No discharge date set yet. Per patient therapy to work on strengthening or legs.  Per patient, she plans to discharge home with spouse when discharged. Patient continues to be interested in 30 day care through [facility] providers post discharge. Patient's friend [name] has not been to see patient due to transportation issues, however continues to connect by phone. Per patient, [name] has located a Teacher, English as a foreign language through [employer], referral paperwork to be completed by this Education officer, museum.  Objective:   Encounter Medications:  Outpatient Encounter Prescriptions as of 10/18/2016  Medication Sig Note  . amoxicillin-clavulanate (AUGMENTIN) 875-125 MG tablet Take 1 tablet by mouth 2 (two) times daily.   Elisha Ponder PEN 30 MCG/0.5ML AJKT Inject 30 mcg into the skin every Monday.   Marland Kitchen buPROPion (WELLBUTRIN XL) 300 MG 24 hr tablet Take 300 mg by mouth every morning.  10/09/2016: Needs refill  . clonazePAM (KLONOPIN) 1 MG tablet Take 1 tablet (1 mg total) by mouth at bedtime.   . DULoxetine (CYMBALTA) 60 MG capsule Take 1 capsule (60 mg total) by mouth every morning.   . fluticasone furoate-vilanterol (BREO ELLIPTA) 100-25 MCG/INH AEPB Inhale 1 puff into the lungs daily. 08/28/2016: Pt uses as needed  . gabapentin (NEURONTIN) 600 MG tablet TAKE 1 TABLET (600 MG TOTAL) BY MOUTH 3 (THREE) TIMES DAILY.   . hydrochlorothiazide (MICROZIDE) 12.5 MG capsule TAKE ONE CAPSULE (12.5 MG TOTAL) BY MOUTH EVERY DAY (THIS REPLACES TRIAMTERENE/HCTZ)   . lidocaine-prilocaine (EMLA) cream Apply 1 application topically as needed. apply cream over the port site and then place small piece of saran wrap over the cream to protect your clothing (Patient  not taking: Reported on 10/17/2016)   . lisinopril (PRINIVIL,ZESTRIL) 5 MG tablet Take 1 tablet (5 mg total) by mouth daily.   . methylphenidate (RITALIN) 10 MG tablet Take 1 tablet (10 mg total) by mouth 2 (two) times daily.   . Multiple Vitamin (MULTIVITAMIN WITH MINERALS) TABS tablet Take 1 tablet by mouth daily.   Marland Kitchen oxyCODONE (OXY IR/ROXICODONE) 5 MG immediate release tablet Take 1 tablet (5 mg total) by mouth every 6 (six) hours as needed for moderate pain or severe pain. (Patient not taking: Reported on 10/17/2016)   . polyethylene glycol powder (GLYCOLAX/MIRALAX) powder Take 17 g by mouth daily. Mixed in water (Patient not taking: Reported on 10/17/2016)   . potassium chloride (K-DUR) 10 MEQ tablet Take 1 tablet (10 mEq total) by mouth daily. 07/02/2016: 0900  . QUEtiapine Fumarate (SEROQUEL XR) 150 MG 24 hr tablet 150 mg at bedtime.    . solifenacin (VESICARE) 5 MG tablet Take 5 mg by mouth daily. 10/09/2016: Patient taking 10 mg by mouth daily  . tiZANidine (ZANAFLEX) 4 MG tablet Take 1 tablet (4 mg total) by mouth 4 (four) times daily. TAKES FOR MS   . topiramate (TOPAMAX) 50 MG tablet Take 1 tablet (50 mg total) by mouth daily.    No facility-administered encounter medications on file as of 10/18/2016.     Functional Status:  In your present state of health, do you have any difficulty performing the following activities: 10/11/2016 10/09/2016  Hearing? Tempie Donning  Vision? Y Y  Difficulty concentrating or making decisions? Tempie Donning  Walking or  climbing stairs? Y Y  Dressing or bathing? Y Y  Doing errands, shopping? Tempie Donning  Preparing Food and eating ? - Y  Using the Toilet? - N  In the past six months, have you accidently leaked urine? - N  Do you have problems with loss of bowel control? - Y  Managing your Medications? - Y  Managing your Finances? - Y  Housekeeping or managing your Housekeeping? - Y  Some recent data might be hidden    Fall/Depression Screening:  PHQ 2/9 Scores 10/09/2016 08/02/2016  06/18/2016 06/07/2016 12/22/2015 11/08/2015 04/18/2015  PHQ - 2 Score 0 1 0 1 6 4 3   PHQ- 9 Score - - - - 15 16 7     Assessment:  Patient currently receiving rehab at [facility]. No discharge date set yet, however patient reports actively participating in therapy in order to get home as soon as possible.  Patient reports needing a lift chair. One identified through [employer], however referral needs to be completed by  This social work will complete referral and will communicate with Homecare Providers regarding patient's discharge from SNF care. Patient's anticipated support once discharged home explored. Per patient, her friend [name] does not have reliable transportation but she is willing to provide care when she can. Per patient, her spouse provides limited support as well due to his own medical issues.  Plan:  This social worker will complete referral paperwork for the [employer]             This social worker will follow up with patient  And discharge planner regarding progress              in therapy within 2 weeks.

## 2016-10-21 ENCOUNTER — Ambulatory Visit: Payer: Medicare Other | Admitting: Family Medicine

## 2016-10-23 ENCOUNTER — Other Ambulatory Visit: Payer: Self-pay | Admitting: *Deleted

## 2016-10-23 ENCOUNTER — Ambulatory Visit: Payer: Self-pay | Admitting: *Deleted

## 2016-10-23 NOTE — Patient Outreach (Signed)
[facility]) Care Management  10/23/2016  [name] April 28, 1951 [id number]   Phone call to [facility] Providers to inform him of patient's discharge date from [facility] and to discuss the 30 day in home assistance. Voicemail message left for a return call.     [name] [facility] Care Management [phone]

## 2016-10-23 NOTE — Patient Outreach (Signed)
[facility]) Care Management  10/23/2016  [name] 1951-03-19 [id number]   Phone call from discharge planner [place]. This social worker was informed that patient will be discharging home on  10/29/16. The care plan meeting is scheduled for 10/24/16 at 1:00pm.   Plan: This social worker to be in attendance on 10/29/16 to discuss patient's discharge plan.    [name] [facility] Care Management [phone]

## 2016-10-24 ENCOUNTER — Other Ambulatory Visit: Payer: Self-pay | Admitting: *Deleted

## 2016-10-24 ENCOUNTER — Inpatient Hospital Stay: Payer: Medicare Other

## 2016-10-24 VITALS — BP 128/76 | HR 62 | Temp 97.1°F | Resp 18

## 2016-10-24 DIAGNOSIS — C8293 Follicular lymphoma, unspecified, intra-abdominal lymph nodes: Secondary | ICD-10-CM

## 2016-10-24 DIAGNOSIS — G35 Multiple sclerosis: Secondary | ICD-10-CM | POA: Diagnosis not present

## 2016-10-24 DIAGNOSIS — Z8619 Personal history of other infectious and parasitic diseases: Secondary | ICD-10-CM | POA: Diagnosis not present

## 2016-10-24 DIAGNOSIS — Z79899 Other long term (current) drug therapy: Secondary | ICD-10-CM | POA: Diagnosis not present

## 2016-10-24 DIAGNOSIS — J449 Chronic obstructive pulmonary disease, unspecified: Secondary | ICD-10-CM | POA: Diagnosis not present

## 2016-10-24 DIAGNOSIS — Z87891 Personal history of nicotine dependence: Secondary | ICD-10-CM | POA: Diagnosis not present

## 2016-10-24 DIAGNOSIS — L03115 Cellulitis of right lower limb: Secondary | ICD-10-CM | POA: Diagnosis not present

## 2016-10-24 DIAGNOSIS — R5381 Other malaise: Secondary | ICD-10-CM | POA: Diagnosis not present

## 2016-10-24 DIAGNOSIS — M419 Scoliosis, unspecified: Secondary | ICD-10-CM | POA: Diagnosis not present

## 2016-10-24 DIAGNOSIS — J984 Other disorders of lung: Secondary | ICD-10-CM | POA: Diagnosis not present

## 2016-10-24 DIAGNOSIS — C8210 Follicular lymphoma grade II, unspecified site: Secondary | ICD-10-CM | POA: Diagnosis not present

## 2016-10-24 DIAGNOSIS — M7989 Other specified soft tissue disorders: Secondary | ICD-10-CM | POA: Diagnosis not present

## 2016-10-24 DIAGNOSIS — K5900 Constipation, unspecified: Secondary | ICD-10-CM | POA: Diagnosis not present

## 2016-10-24 DIAGNOSIS — I739 Peripheral vascular disease, unspecified: Secondary | ICD-10-CM | POA: Diagnosis not present

## 2016-10-24 DIAGNOSIS — I10 Essential (primary) hypertension: Secondary | ICD-10-CM | POA: Diagnosis not present

## 2016-10-24 DIAGNOSIS — L03116 Cellulitis of left lower limb: Secondary | ICD-10-CM | POA: Diagnosis not present

## 2016-10-24 LAB — CBC WITH DIFFERENTIAL/PLATELET
Basophils Absolute: 0.1 10*3/uL (ref 0–0.1)
Basophils Relative: 1 %
Eosinophils Absolute: 0.3 10*3/uL (ref 0–0.7)
Eosinophils Relative: 4 %
HCT: 35.2 % (ref 35.0–47.0)
Hemoglobin: 12 g/dL (ref 12.0–16.0)
Lymphocytes Relative: 12 %
Lymphs Abs: 0.8 10*3/uL — ABNORMAL LOW (ref 1.0–3.6)
MCH: 28.7 pg (ref 26.0–34.0)
MCHC: 34.2 g/dL (ref 32.0–36.0)
MCV: 83.9 fL (ref 80.0–100.0)
Monocytes Absolute: 0.5 10*3/uL (ref 0.2–0.9)
Monocytes Relative: 7 %
Neutro Abs: 5 10*3/uL (ref 1.4–6.5)
Neutrophils Relative %: 76 %
Platelets: 276 10*3/uL (ref 150–440)
RBC: 4.19 MIL/uL (ref 3.80–5.20)
RDW: 15.7 % — ABNORMAL HIGH (ref 11.5–14.5)
WBC: 6.6 10*3/uL (ref 3.6–11.0)

## 2016-10-24 LAB — COMPREHENSIVE METABOLIC PANEL
ALT: 16 U/L (ref 14–54)
AST: 22 U/L (ref 15–41)
Albumin: 3.6 g/dL (ref 3.5–5.0)
Alkaline Phosphatase: 68 U/L (ref 38–126)
Anion gap: 8 (ref 5–15)
BUN: 25 mg/dL — ABNORMAL HIGH (ref 6–20)
CO2: 27 mmol/L (ref 22–32)
Calcium: 9 mg/dL (ref 8.9–10.3)
Chloride: 99 mmol/L — ABNORMAL LOW (ref 101–111)
Creatinine, Ser: 1.42 mg/dL — ABNORMAL HIGH (ref 0.44–1.00)
GFR calc Af Amer: 44 mL/min — ABNORMAL LOW (ref >60)
GFR calc non Af Amer: 38 mL/min — ABNORMAL LOW (ref >60)
Glucose, Bld: 128 mg/dL — ABNORMAL HIGH (ref 65–99)
Potassium: 3.7 mmol/L (ref 3.5–5.1)
Sodium: 134 mmol/L — ABNORMAL LOW (ref 135–145)
Total Bilirubin: 0.6 mg/dL (ref 0.3–1.2)
Total Protein: 7.1 g/dL (ref 6.5–8.1)

## 2016-10-24 MED ORDER — SODIUM CHLORIDE 0.9% FLUSH
10.0000 mL | INTRAVENOUS | Status: DC | PRN
  Administered 2016-10-24: 10 mL via INTRAVENOUS
  Filled 2016-10-24: qty 10

## 2016-10-24 MED ORDER — SODIUM CHLORIDE 0.9 % IV SOLN
Freq: Once | INTRAVENOUS | Status: AC
  Administered 2016-10-24: 10:00:00 via INTRAVENOUS
  Filled 2016-10-24: qty 1000

## 2016-10-24 MED ORDER — SODIUM CHLORIDE 0.9 % IV SOLN
375.0000 mg/m2 | Freq: Once | INTRAVENOUS | Status: AC
  Administered 2016-10-24: 900 mg via INTRAVENOUS
  Filled 2016-10-24: qty 40

## 2016-10-24 MED ORDER — SODIUM CHLORIDE 0.9 % IV SOLN: 375.0000 mg/m2 | Freq: Once | INTRAVENOUS | Status: DC

## 2016-10-24 MED ORDER — DIPHENHYDRAMINE HCL 25 MG PO CAPS
50.0000 mg | ORAL_CAPSULE | Freq: Once | ORAL | Status: AC
  Administered 2016-10-24: 50 mg via ORAL
  Filled 2016-10-24: qty 2

## 2016-10-24 MED ORDER — HEPARIN SOD (PORK) LOCK FLUSH 100 UNIT/ML IV SOLN
500.0000 [IU] | Freq: Once | INTRAVENOUS | Status: AC
  Administered 2016-10-24: 500 [IU] via INTRAVENOUS

## 2016-10-24 MED ORDER — ACETAMINOPHEN 325 MG PO TABS
650.0000 mg | ORAL_TABLET | Freq: Once | ORAL | Status: AC
  Administered 2016-10-24: 650 mg via ORAL
  Filled 2016-10-24: qty 2

## 2016-10-24 NOTE — Patient Outreach (Signed)
[facility]) Care Management  [facility] Social Work  10/24/2016  [name] [birth year] [id number]  Subjective:  Patient is a 66 year old female currently in rehab at [facility]. Patient scheduled to discharge home on 10/29/16. Care Planning meeting took place today. Discharge planner, PT and Dietician present. Per PT, patient's overall functioning has improved. Patient doing well and is motivated to improve. HH recommended upon discharge. Wheelchair also ordered. Hospital bed not recommended.  Patient states that her husband has purchased a lift chair for her, she has a Engineer, civil (consulting), built in Electronics engineer and bedside commode. The Dietician states that patient is on a "no salt added diet". Patient discussed limited assistance from spouse, friend [name] can assist but does not have transportation.  Objective:   Encounter Medications:  Outpatient Encounter Prescriptions as of 10/24/2016  Medication Sig Note  . AVONEX PEN 30 MCG/0.5ML AJKT Inject 30 mcg into the skin every Monday.   Marland Kitchen buPROPion (WELLBUTRIN XL) 300 MG 24 hr tablet Take 300 mg by mouth every morning.  10/09/2016: Needs refill  . clonazePAM (KLONOPIN) 1 MG tablet Take 1 tablet (1 mg total) by mouth at bedtime.   . DULoxetine (CYMBALTA) 60 MG capsule Take 1 capsule (60 mg total) by mouth every morning.   . fluticasone furoate-vilanterol (BREO ELLIPTA) 100-25 MCG/INH AEPB Inhale 1 puff into the lungs daily. 08/28/2016: Pt uses as needed  . gabapentin (NEURONTIN) 600 MG tablet TAKE 1 TABLET (600 MG TOTAL) BY MOUTH 3 (THREE) TIMES DAILY.   . hydrochlorothiazide (MICROZIDE) 12.5 MG capsule TAKE ONE CAPSULE (12.5 MG TOTAL) BY MOUTH EVERY DAY (THIS REPLACES TRIAMTERENE/HCTZ)   . lidocaine-prilocaine (EMLA) cream Apply 1 application topically as needed. apply cream over the port site and then place small piece of saran wrap over the cream to protect your clothing (Patient not taking: Reported on 10/17/2016)   . lisinopril  (PRINIVIL,ZESTRIL) 5 MG tablet Take 1 tablet (5 mg total) by mouth daily.   . methylphenidate (RITALIN) 10 MG tablet Take 1 tablet (10 mg total) by mouth 2 (two) times daily.   . Multiple Vitamin (MULTIVITAMIN WITH MINERALS) TABS tablet Take 1 tablet by mouth daily.   Marland Kitchen oxyCODONE (OXY IR/ROXICODONE) 5 MG immediate release tablet Take 1 tablet (5 mg total) by mouth every 6 (six) hours as needed for moderate pain or severe pain. (Patient not taking: Reported on 10/17/2016)   . polyethylene glycol powder (GLYCOLAX/MIRALAX) powder Take 17 g by mouth daily. Mixed in water (Patient not taking: Reported on 10/17/2016)   . potassium chloride (K-DUR) 10 MEQ tablet Take 1 tablet (10 mEq total) by mouth daily. 07/02/2016: 0900  . QUEtiapine Fumarate (SEROQUEL XR) 150 MG 24 hr tablet 150 mg at bedtime.    . solifenacin (VESICARE) 5 MG tablet Take 5 mg by mouth daily. 10/09/2016: Patient taking 10 mg by mouth daily  . tiZANidine (ZANAFLEX) 4 MG tablet Take 1 tablet (4 mg total) by mouth 4 (four) times daily. TAKES FOR MS   . topiramate (TOPAMAX) 50 MG tablet Take 1 tablet (50 mg total) by mouth daily.    Facility-Administered Encounter Medications as of 10/24/2016  Medication  . [COMPLETED] heparin lock flush 100 unit/mL  . [COMPLETED] riTUXimab (RITUXAN) 900 mg in sodium chloride 0.9 % 160 mL chemo infusion  . sodium chloride flush (NS) 0.9 % injection 10 mL    Functional Status:  In your present state of health, do you have any difficulty performing the following activities:  10/11/2016 10/09/2016  Hearing? Tempie Donning  Vision? Y Y  Difficulty concentrating or making decisions? Tempie Donning  Walking or climbing stairs? Y Y  Dressing or bathing? Y Y  Doing errands, shopping? Tempie Donning  Preparing Food and eating ? - Y  Using the Toilet? - N  In the past six months, have you accidently leaked urine? - N  Do you have problems with loss of bowel control? - Y  Managing your Medications? - Y  Managing your Finances? - Y  Housekeeping  or managing your Housekeeping? - Y  Some recent data might be hidden    Fall/Depression Screening:  PHQ 2/9 Scores 10/09/2016 08/02/2016 06/18/2016 06/07/2016 12/22/2015 11/08/2015 04/18/2015  PHQ - 2 Score 0 1 0 1 6 4 3   PHQ- 9 Score - - - - 15 16 7     Assessment:  Patient eager to return home, however seemed agitated during the meeting. Patient not confident in spouse's ability to assist post discharge from the SNF.   Patient to be referred to the 30 day in home care through [facility] to assist with in home assistance while long term plan of care is coordinated.  Patient's medication list obtained.  Plan:  This Education officer, museum to follow up with patient post discharge from the SNF.   [place], [place] Management [phone]              This social worker to notify [facility] and [facility] of patient's discharge.

## 2016-10-25 ENCOUNTER — Other Ambulatory Visit: Payer: Self-pay | Admitting: *Deleted

## 2016-10-25 NOTE — Patient Outreach (Signed)
Late entry:  This RN CM was informed by Chrystal LCSW 10/24/16 that pt is to discharge from [facility] to home 7/3, pt  admitted to SNF after recent hospitalization 10/11/16 - 10/14/16 for swelling,cellulitis of left lower extremity.   RN CM to follow up with pt telephonically post discharge.   This RN CM followed  pt for transition of care for  previous SNF discharge 5/25- admitted after hospitalization 5/2-5/4 for cellulitis (left lower extremity), recurrent falls.   (2 admits within 2 months).    Zara Chess.   [place] Care Management  [phone]

## 2016-10-28 ENCOUNTER — Ambulatory Visit: Payer: Self-pay | Admitting: *Deleted

## 2016-10-28 ENCOUNTER — Other Ambulatory Visit: Payer: Self-pay | Admitting: Family Medicine

## 2016-10-28 NOTE — Telephone Encounter (Signed)
Gabapentin - too early to request. Topamax Rx'd by another provider. Please notify patient and clarify if needed. Thank you!

## 2016-10-29 ENCOUNTER — Other Ambulatory Visit: Payer: Self-pay | Admitting: Oncology

## 2016-10-29 DIAGNOSIS — D6489 Other specified anemias: Secondary | ICD-10-CM | POA: Diagnosis not present

## 2016-10-29 DIAGNOSIS — N138 Other obstructive and reflux uropathy: Secondary | ICD-10-CM | POA: Diagnosis not present

## 2016-10-29 DIAGNOSIS — C8580 Other specified types of non-Hodgkin lymphoma, unspecified site: Secondary | ICD-10-CM | POA: Diagnosis not present

## 2016-10-29 DIAGNOSIS — G35 Multiple sclerosis: Secondary | ICD-10-CM | POA: Diagnosis not present

## 2016-10-29 NOTE — Telephone Encounter (Signed)
Tried to call patient several times voicemail has not been setup?

## 2016-10-31 ENCOUNTER — Ambulatory Visit: Payer: Self-pay | Admitting: *Deleted

## 2016-10-31 ENCOUNTER — Inpatient Hospital Stay: Payer: Medicare Other

## 2016-11-01 ENCOUNTER — Other Ambulatory Visit: Payer: Self-pay | Admitting: *Deleted

## 2016-11-01 ENCOUNTER — Ambulatory Visit: Payer: Self-pay | Admitting: *Deleted

## 2016-11-01 NOTE — Patient Outreach (Addendum)
Successful telephone encounter  with pt [name] 66 year old woman, follow up on recent SNF ([facility]) 10/29/16 discharge, admitted to rehab after recent hospitalization 10/11/16 to 10/14/16 for swelling, cellulitis of left lower extremity. This RN CM followed pt for transition of care for previous SNF discharge 5/25 - admitted after 5/2-08/30/16 for cellulitis left lower extremity, recurrent falls (2 admits within 2 months). Pt's history includes but not limited to COPD, Hypertension, MS,CKD stage III.     Spoke with pt, HIPAA identifiers provided.  Pt reports doing good since SNF discharge, excited was able  to take a shower/some assistance.  Pt reports spouse helps as needed.   Pt reports cellulitis is healed, using a walker (rollator) to get around.  Pt reports no problems breathing, gets sob with  over exertion.   Pt reports Home health was ordered, have not heard from them yet, have number to call if needed.  Pt reports have follow up appointments scheduled- Dr. Sanda Klein 11/06/16, CT scan of stomach 11/05/16,  Labs/cancer MD/infusion 11/07/16.  Pt reports taking all of her medications as ordered per discharge papers, does not have papers available to review with RN CM at this time.   RN CM reviewed with pt transition of care program- to follow for 31 days (weekly calls, a home visit), no cost to her.   Also discussed with pt coworker [name] RN CM covering for this RN CM will be doing a follow call next week.    Plan:  As discussed with pt, coworker Journalist, newspaper CM covering for this RN CM to follow up next week telephonically (part of ongoing transition of care).           Plan to update Dr. Sanda Klein by in basket will be following pt again for transition of care.    Zara Chess.   [place] Care Management  [phone]

## 2016-11-03 DIAGNOSIS — G4733 Obstructive sleep apnea (adult) (pediatric): Secondary | ICD-10-CM | POA: Diagnosis not present

## 2016-11-03 DIAGNOSIS — J449 Chronic obstructive pulmonary disease, unspecified: Secondary | ICD-10-CM | POA: Diagnosis not present

## 2016-11-03 DIAGNOSIS — Z9181 History of falling: Secondary | ICD-10-CM | POA: Diagnosis not present

## 2016-11-03 DIAGNOSIS — E559 Vitamin D deficiency, unspecified: Secondary | ICD-10-CM | POA: Diagnosis not present

## 2016-11-03 DIAGNOSIS — I10 Essential (primary) hypertension: Secondary | ICD-10-CM | POA: Diagnosis not present

## 2016-11-03 DIAGNOSIS — I2729 Other secondary pulmonary hypertension: Secondary | ICD-10-CM | POA: Diagnosis not present

## 2016-11-03 DIAGNOSIS — M5416 Radiculopathy, lumbar region: Secondary | ICD-10-CM | POA: Diagnosis not present

## 2016-11-03 DIAGNOSIS — G43909 Migraine, unspecified, not intractable, without status migrainosus: Secondary | ICD-10-CM | POA: Diagnosis not present

## 2016-11-03 DIAGNOSIS — G35 Multiple sclerosis: Secondary | ICD-10-CM | POA: Diagnosis not present

## 2016-11-03 DIAGNOSIS — M1990 Unspecified osteoarthritis, unspecified site: Secondary | ICD-10-CM | POA: Diagnosis not present

## 2016-11-03 DIAGNOSIS — I739 Peripheral vascular disease, unspecified: Secondary | ICD-10-CM | POA: Diagnosis not present

## 2016-11-03 DIAGNOSIS — G629 Polyneuropathy, unspecified: Secondary | ICD-10-CM | POA: Diagnosis not present

## 2016-11-04 ENCOUNTER — Other Ambulatory Visit: Payer: Self-pay | Admitting: *Deleted

## 2016-11-04 NOTE — Patient Outreach (Addendum)
[facility]) Care Management  [facility] Social Work  11/04/2016  [name] 11-08-1950 [id number]  Subjective:  Patient is a 66 year old female, recently discharged from [facility] on 10/29/16. Patient was hospitalized previous to her SNF stay due to swelling, cellulitis of the left lower extremity.  Per patient, she is feeling much better. Patient able to walk with the assistance of her Rolator walker. Patient released with a large shoe horn and reacher. Nurse from [facility] completed assessment yesterday to begin [facility] services. [facility] has not made contact with patient to date, voicemail message left. Patient continues to feel uncomfortable in current living situation. Per patient, she does not feel settled yet due to the multiple renovations currently happening in the home.  Objective:   Encounter Medications:  Outpatient Encounter Prescriptions as of 11/04/2016  Medication Sig Note  . AVONEX PEN 30 MCG/0.5ML AJKT Inject 30 mcg into the skin every Monday.   Marland Kitchen buPROPion (WELLBUTRIN XL) 300 MG 24 hr tablet Take 300 mg by mouth every morning.  11/04/2016: Needs refill  . clonazePAM (KLONOPIN) 1 MG tablet Take 1 tablet (1 mg total) by mouth at bedtime.   . DULoxetine (CYMBALTA) 60 MG capsule Take 1 capsule (60 mg total) by mouth every morning.   . fluticasone furoate-vilanterol (BREO ELLIPTA) 100-25 MCG/INH AEPB Inhale 1 puff into the lungs daily. 08/28/2016: Pt uses as needed  . gabapentin (NEURONTIN) 600 MG tablet TAKE 1 TABLET (600 MG TOTAL) BY MOUTH 3 (THREE) TIMES DAILY.   . hydrochlorothiazide (MICROZIDE) 12.5 MG capsule TAKE ONE CAPSULE (12.5 MG TOTAL) BY MOUTH EVERY DAY (THIS REPLACES TRIAMTERENE/HCTZ)   . lidocaine-prilocaine (EMLA) cream Apply 1 application topically as needed. apply cream over the port site and then place small piece of saran wrap over the cream to protect your clothing 11/04/2016: Uses as needed  . lisinopril (PRINIVIL,ZESTRIL) 5 MG tablet Take 1  tablet (5 mg total) by mouth daily.   . methylphenidate (RITALIN) 10 MG tablet Take 1 tablet (10 mg total) by mouth 2 (two) times daily.   . Multiple Vitamin (MULTIVITAMIN WITH MINERALS) TABS tablet Take 1 tablet by mouth daily.   . potassium chloride (K-DUR) 10 MEQ tablet Take 1 tablet (10 mEq total) by mouth daily. 07/02/2016: 0900  . QUEtiapine Fumarate (SEROQUEL XR) 150 MG 24 hr tablet 150 mg at bedtime.    . solifenacin (VESICARE) 5 MG tablet Take 5 mg by mouth daily. 10/09/2016: Patient taking 10 mg by mouth daily  . tiZANidine (ZANAFLEX) 4 MG tablet Take 1 tablet (4 mg total) by mouth 4 (four) times daily. TAKES FOR MS   . topiramate (TOPAMAX) 50 MG tablet Take 1 tablet (50 mg total) by mouth daily.   Marland Kitchen oxyCODONE (OXY IR/ROXICODONE) 5 MG immediate release tablet Take 1 tablet (5 mg total) by mouth every 6 (six) hours as needed for moderate pain or severe pain. (Patient not taking: Reported on 10/17/2016) 11/04/2016: Only taking as needed. Last taken Saturday 11/02/16  . polyethylene glycol powder (GLYCOLAX/MIRALAX) powder Take 17 g by mouth daily. Mixed in water (Patient not taking: Reported on 10/17/2016)    No facility-administered encounter medications on file as of 11/04/2016.     Functional Status:  In your present state of health, do you have any difficulty performing the following activities: 10/11/2016 10/09/2016  Hearing? Tempie Donning  Vision? Y Y  Difficulty concentrating or making decisions? Tempie Donning  Walking or climbing stairs? Y Y  Dressing or bathing?  Y Y  Doing errands, shopping? Tempie Donning  Preparing Food and eating ? - Y  Using the Toilet? - N  In the past six months, have you accidently leaked urine? - N  Do you have problems with loss of bowel control? - Y  Managing your Medications? - Y  Managing your Finances? - Y  Housekeeping or managing your Housekeeping? - Y  Some recent data might be hidden    Fall/Depression Screening:  PHQ 2/9 Scores 10/09/2016 08/02/2016 06/18/2016 06/07/2016 12/22/2015  11/08/2015 04/18/2015  PHQ - 2 Score 0 1 0 1 6 4 3   PHQ- 9 Score - - - - 15 16 7     Assessment: HomeCare providers contacted during visit. Voicemail message left for a return call. Patient's friend [name] present during the visit assisting with filling her pill box. Patient has an appointment for  CAT scan on  abdomen scheduled for 11/05/16,. appointment with primary care doctor on 11/06/16.  Patient has an appointment for labs on 11/07/16. Patient has a lift chair that she sleeps in.  Per patient, she continues to need help with ADL's Frustrated with not having her own space. Patient resides with her estranged spouse who has purchased a lift chair for her, has remodeled the bathroom and is building her a closet for her things.  Home is cluttered due to current renovations being made.  Plan: This Education officer, museum will follow up with Homecare providers.             Patient to request a new walker and discuss the possibility of a hospital bed with her            Doctor.            This Education officer, museum to follow up with patient within 30 days.   [name] [facility] Care Management [phone]

## 2016-11-05 ENCOUNTER — Ambulatory Visit
Admission: RE | Admit: 2016-11-05 | Discharge: 2016-11-05 | Disposition: A | Discharge status: Home / Self Care | Payer: Medicare Other | Source: Ambulatory Visit | Attending: Oncology | Admitting: Oncology

## 2016-11-05 ENCOUNTER — Other Ambulatory Visit: Payer: Self-pay | Admitting: Oncology

## 2016-11-05 DIAGNOSIS — G35 Multiple sclerosis: Secondary | ICD-10-CM | POA: Diagnosis not present

## 2016-11-05 DIAGNOSIS — M5416 Radiculopathy, lumbar region: Secondary | ICD-10-CM | POA: Diagnosis not present

## 2016-11-05 DIAGNOSIS — I10 Essential (primary) hypertension: Secondary | ICD-10-CM | POA: Diagnosis not present

## 2016-11-05 DIAGNOSIS — J449 Chronic obstructive pulmonary disease, unspecified: Secondary | ICD-10-CM | POA: Diagnosis not present

## 2016-11-05 DIAGNOSIS — G4733 Obstructive sleep apnea (adult) (pediatric): Secondary | ICD-10-CM | POA: Diagnosis not present

## 2016-11-05 DIAGNOSIS — M1990 Unspecified osteoarthritis, unspecified site: Secondary | ICD-10-CM | POA: Diagnosis not present

## 2016-11-05 DIAGNOSIS — I739 Peripheral vascular disease, unspecified: Secondary | ICD-10-CM | POA: Diagnosis not present

## 2016-11-05 DIAGNOSIS — N200 Calculus of kidney: Secondary | ICD-10-CM | POA: Diagnosis not present

## 2016-11-05 DIAGNOSIS — I2510 Atherosclerotic heart disease of native coronary artery without angina pectoris: Secondary | ICD-10-CM | POA: Insufficient documentation

## 2016-11-05 DIAGNOSIS — C8293 Follicular lymphoma, unspecified, intra-abdominal lymph nodes: Secondary | ICD-10-CM

## 2016-11-05 DIAGNOSIS — G43909 Migraine, unspecified, not intractable, without status migrainosus: Secondary | ICD-10-CM | POA: Diagnosis not present

## 2016-11-05 DIAGNOSIS — G629 Polyneuropathy, unspecified: Secondary | ICD-10-CM | POA: Diagnosis not present

## 2016-11-05 DIAGNOSIS — C8290 Follicular lymphoma, unspecified, unspecified site: Secondary | ICD-10-CM | POA: Diagnosis not present

## 2016-11-05 DIAGNOSIS — I700 Atherosclerosis of aorta: Secondary | ICD-10-CM | POA: Diagnosis not present

## 2016-11-05 DIAGNOSIS — I2729 Other secondary pulmonary hypertension: Secondary | ICD-10-CM | POA: Diagnosis not present

## 2016-11-05 DIAGNOSIS — E559 Vitamin D deficiency, unspecified: Secondary | ICD-10-CM | POA: Diagnosis not present

## 2016-11-05 MED ORDER — IOPAMIDOL (ISOVUE-300) INJECTION 61%
75.0000 mL | Freq: Once | INTRAVENOUS | Status: AC | PRN
  Administered 2016-11-05: 75 mL via INTRAVENOUS

## 2016-11-06 ENCOUNTER — Ambulatory Visit: Payer: Medicare Other | Admitting: Family Medicine

## 2016-11-07 ENCOUNTER — Inpatient Hospital Stay (HOSPITAL_BASED_OUTPATIENT_CLINIC_OR_DEPARTMENT_OTHER): Payer: Medicare Other | Admitting: Oncology

## 2016-11-07 ENCOUNTER — Inpatient Hospital Stay: Payer: Medicare Other | Attending: Oncology

## 2016-11-07 ENCOUNTER — Encounter: Payer: Self-pay | Admitting: *Deleted

## 2016-11-07 ENCOUNTER — Telehealth: Payer: Self-pay | Admitting: Obstetrics and Gynecology

## 2016-11-07 ENCOUNTER — Encounter: Payer: Self-pay | Admitting: Oncology

## 2016-11-07 ENCOUNTER — Inpatient Hospital Stay: Payer: Medicare Other

## 2016-11-07 ENCOUNTER — Other Ambulatory Visit: Payer: Self-pay | Admitting: *Deleted

## 2016-11-07 VITALS — BP 127/81 | HR 67 | Temp 95.2°F | Resp 20 | Wt 288.3 lb

## 2016-11-07 DIAGNOSIS — G35 Multiple sclerosis: Secondary | ICD-10-CM

## 2016-11-07 DIAGNOSIS — Z5112 Encounter for antineoplastic immunotherapy: Secondary | ICD-10-CM | POA: Insufficient documentation

## 2016-11-07 DIAGNOSIS — R609 Edema, unspecified: Secondary | ICD-10-CM | POA: Insufficient documentation

## 2016-11-07 DIAGNOSIS — Z79899 Other long term (current) drug therapy: Secondary | ICD-10-CM

## 2016-11-07 DIAGNOSIS — G4733 Obstructive sleep apnea (adult) (pediatric): Secondary | ICD-10-CM | POA: Diagnosis not present

## 2016-11-07 DIAGNOSIS — M5416 Radiculopathy, lumbar region: Secondary | ICD-10-CM | POA: Diagnosis not present

## 2016-11-07 DIAGNOSIS — Z87891 Personal history of nicotine dependence: Secondary | ICD-10-CM | POA: Diagnosis not present

## 2016-11-07 DIAGNOSIS — J449 Chronic obstructive pulmonary disease, unspecified: Secondary | ICD-10-CM | POA: Insufficient documentation

## 2016-11-07 DIAGNOSIS — F909 Attention-deficit hyperactivity disorder, unspecified type: Secondary | ICD-10-CM | POA: Diagnosis not present

## 2016-11-07 DIAGNOSIS — M419 Scoliosis, unspecified: Secondary | ICD-10-CM | POA: Insufficient documentation

## 2016-11-07 DIAGNOSIS — M7989 Other specified soft tissue disorders: Secondary | ICD-10-CM | POA: Diagnosis not present

## 2016-11-07 DIAGNOSIS — C8293 Follicular lymphoma, unspecified, intra-abdominal lymph nodes: Secondary | ICD-10-CM | POA: Diagnosis not present

## 2016-11-07 DIAGNOSIS — I700 Atherosclerosis of aorta: Secondary | ICD-10-CM

## 2016-11-07 DIAGNOSIS — F419 Anxiety disorder, unspecified: Secondary | ICD-10-CM

## 2016-11-07 DIAGNOSIS — I2510 Atherosclerotic heart disease of native coronary artery without angina pectoris: Secondary | ICD-10-CM | POA: Diagnosis not present

## 2016-11-07 DIAGNOSIS — R1031 Right lower quadrant pain: Secondary | ICD-10-CM | POA: Insufficient documentation

## 2016-11-07 DIAGNOSIS — I739 Peripheral vascular disease, unspecified: Secondary | ICD-10-CM | POA: Insufficient documentation

## 2016-11-07 DIAGNOSIS — R590 Localized enlarged lymph nodes: Secondary | ICD-10-CM | POA: Insufficient documentation

## 2016-11-07 DIAGNOSIS — R0602 Shortness of breath: Secondary | ICD-10-CM

## 2016-11-07 DIAGNOSIS — I10 Essential (primary) hypertension: Secondary | ICD-10-CM

## 2016-11-07 DIAGNOSIS — I2729 Other secondary pulmonary hypertension: Secondary | ICD-10-CM | POA: Diagnosis not present

## 2016-11-07 DIAGNOSIS — L98499 Non-pressure chronic ulcer of skin of other sites with unspecified severity: Secondary | ICD-10-CM

## 2016-11-07 DIAGNOSIS — K5900 Constipation, unspecified: Secondary | ICD-10-CM

## 2016-11-07 DIAGNOSIS — N83209 Unspecified ovarian cyst, unspecified side: Secondary | ICD-10-CM

## 2016-11-07 DIAGNOSIS — F329 Major depressive disorder, single episode, unspecified: Secondary | ICD-10-CM | POA: Insufficient documentation

## 2016-11-07 DIAGNOSIS — M1990 Unspecified osteoarthritis, unspecified site: Secondary | ICD-10-CM | POA: Diagnosis not present

## 2016-11-07 DIAGNOSIS — L03116 Cellulitis of left lower limb: Secondary | ICD-10-CM | POA: Insufficient documentation

## 2016-11-07 DIAGNOSIS — G629 Polyneuropathy, unspecified: Secondary | ICD-10-CM | POA: Diagnosis not present

## 2016-11-07 DIAGNOSIS — E559 Vitamin D deficiency, unspecified: Secondary | ICD-10-CM | POA: Diagnosis not present

## 2016-11-07 DIAGNOSIS — G43909 Migraine, unspecified, not intractable, without status migrainosus: Secondary | ICD-10-CM | POA: Diagnosis not present

## 2016-11-07 LAB — CBC WITH DIFFERENTIAL/PLATELET
Basophils Absolute: 0.1 10*3/uL (ref 0–0.1)
Basophils Relative: 1 %
Eosinophils Absolute: 0.5 10*3/uL (ref 0–0.7)
Eosinophils Relative: 6 %
HCT: 36.3 % (ref 35.0–47.0)
Hemoglobin: 12.5 g/dL (ref 12.0–16.0)
Lymphocytes Relative: 14 %
Lymphs Abs: 1 10*3/uL (ref 1.0–3.6)
MCH: 28.6 pg (ref 26.0–34.0)
MCHC: 34.5 g/dL (ref 32.0–36.0)
MCV: 82.9 fL (ref 80.0–100.0)
Monocytes Absolute: 0.5 10*3/uL (ref 0.2–0.9)
Monocytes Relative: 7 %
Neutro Abs: 5.3 10*3/uL (ref 1.4–6.5)
Neutrophils Relative %: 72 %
Platelets: 281 10*3/uL (ref 150–440)
RBC: 4.38 MIL/uL (ref 3.80–5.20)
RDW: 14.8 % — ABNORMAL HIGH (ref 11.5–14.5)
WBC: 7.4 10*3/uL (ref 3.6–11.0)

## 2016-11-07 LAB — COMPREHENSIVE METABOLIC PANEL
ALT: 17 U/L (ref 14–54)
AST: 19 U/L (ref 15–41)
Albumin: 3.7 g/dL (ref 3.5–5.0)
Alkaline Phosphatase: 73 U/L (ref 38–126)
Anion gap: 7 (ref 5–15)
BUN: 19 mg/dL (ref 6–20)
CO2: 28 mmol/L (ref 22–32)
Calcium: 10 mg/dL (ref 8.9–10.3)
Chloride: 101 mmol/L (ref 101–111)
Creatinine, Ser: 1.32 mg/dL — ABNORMAL HIGH (ref 0.44–1.00)
GFR calc Af Amer: 48 mL/min — ABNORMAL LOW (ref >60)
GFR calc non Af Amer: 41 mL/min — ABNORMAL LOW (ref >60)
Glucose, Bld: 102 mg/dL — ABNORMAL HIGH (ref 65–99)
Potassium: 3.5 mmol/L (ref 3.5–5.1)
Sodium: 136 mmol/L (ref 135–145)
Total Bilirubin: 0.3 mg/dL (ref 0.3–1.2)
Total Protein: 7 g/dL (ref 6.5–8.1)

## 2016-11-07 MED ORDER — DIPHENHYDRAMINE HCL 25 MG PO CAPS
50.0000 mg | ORAL_CAPSULE | Freq: Once | ORAL | Status: AC
  Administered 2016-11-07: 50 mg via ORAL
  Filled 2016-11-07: qty 2

## 2016-11-07 MED ORDER — SODIUM CHLORIDE 0.9 % IV SOLN
Freq: Once | INTRAVENOUS | Status: AC
  Administered 2016-11-07: 11:00:00 via INTRAVENOUS
  Filled 2016-11-07: qty 1000

## 2016-11-07 MED ORDER — SODIUM CHLORIDE 0.9 % IV SOLN
375.0000 mg/m2 | Freq: Once | INTRAVENOUS | Status: AC
  Administered 2016-11-07: 900 mg via INTRAVENOUS
  Filled 2016-11-07: qty 50

## 2016-11-07 MED ORDER — DOXYCYCLINE HYCLATE 100 MG PO TABS: 100.0000 mg | ORAL_TABLET | Freq: Two times a day (BID) | ORAL | 0 refills | Status: DC

## 2016-11-07 MED ORDER — SODIUM CHLORIDE 0.9% FLUSH
10.0000 mL | Freq: Once | INTRAVENOUS | Status: AC
  Administered 2016-11-07: 10 mL via INTRAVENOUS
  Filled 2016-11-07: qty 10

## 2016-11-07 MED ORDER — HEPARIN SOD (PORK) LOCK FLUSH 100 UNIT/ML IV SOLN
500.0000 [IU] | Freq: Once | INTRAVENOUS | Status: AC | PRN
  Filled 2016-11-07: qty 5

## 2016-11-07 MED ORDER — HEPARIN SOD (PORK) LOCK FLUSH 100 UNIT/ML IV SOLN
500.0000 [IU] | Freq: Once | INTRAVENOUS | Status: AC
  Administered 2016-11-07: 500 [IU] via INTRAVENOUS

## 2016-11-07 MED ORDER — SODIUM CHLORIDE 0.9 % IV SOLN: 375.0000 mg/m2 | Freq: Once | INTRAVENOUS | Status: DC

## 2016-11-07 MED ORDER — ACETAMINOPHEN 325 MG PO TABS
650.0000 mg | ORAL_TABLET | Freq: Once | ORAL | Status: AC
  Administered 2016-11-07: 650 mg via ORAL
  Filled 2016-11-07: qty 2

## 2016-11-07 MED ORDER — LIDOCAINE-PRILOCAINE 2.5-2.5 % EX CREA: 1.0000 "application " | TOPICAL_CREAM | CUTANEOUS | 1 refills | Status: DC | PRN

## 2016-11-07 NOTE — Progress Notes (Signed)
Here for follow up. In pt for cellulitis in both legs. Noted L leg below knee motled red ,warm to touch ,mild pain per pt. In rehab 2 wks-home now.

## 2016-11-07 NOTE — Progress Notes (Signed)
Hematology/Oncology Consult note [facility]  Telephone:([phone] [phone]  Patient Care Team: Arnetha Courser, MD as PCP - General (Family Medicine) Sanda Klein, Satira Anis, MD as Attending Physician (Family Medicine) Christene Lye, MD (General Surgery) Lyman Speller, RN as [place], [place], [place] as [place] Management   Name of the patient: [name]  [id number]  30-Aug-1950   Date of visit: 11/07/16  Diagnosis- Stage II follicular lymphoma  Chief complaint/ Reason for visit- on treatment assessment prior to cycle 3 of rituxan  Heme/Onc history: 1. patient is a 66 year old female who has had ongoing problems with pain in her left groin as well as his left leg swelling.   2. She had a Doppler of her left lower extremity on 02/28/2016 which showed: 1. Mild to moderate edema within the left calf. No evidence for acute DVT from left groin to popliteal fossa. Slightly limited evaluation of calf veins due to edema. 2. Left inguinal enlarged lymph nodes up to 6.2 cm, findings could be secondary to reactive node, infection, inflammation, or lymphoproliferative disease.  3. She had a repeat Doppler on 05/24/2016 which showed: IMPRESSION: 1. No evidence of lower extremity deep vein thrombosis, left. 2. Left inguinal lymphadenopathy. Differential diagnosis: Reactive adenopathy, lymphoma, metastatic disease.  4. CT abdomen on 06/14/2016 showed:  IMPRESSION: Lymphadenopathy within the left inguinal region and the left iliac chain. This is highly suspicious for underlying neoplasm. Biopsy is recommended for further evaluation.  5. Patient was seen by Dr. Jamal Collin from surgery and underwent excisional biopsy of the leftinguinal lymph node under ultrasound on 07/04/2016. Final pathology showed DIAGNOSIS:  A-B. LYMPH NODE, LEFT INGUINAL; EXCISION:  - FOLLICULAR LYMPHOMA,  GRADE 1-2, PREDOMINANTLY FOLLICULAR.   6. Patient has multiple sclerosis and has h/o depression and anxiety. She was given clonopin this morning and currently patient is drowsy but able to be aroused. She drifts off to sleep in between conversation. She is here with her friend who helps her out. Patient lives with her husband at baseline and uses a walker to ambulate. Reports pain and discomfort in her left groin. Denies fevers, chills, night sweats or weight loss  7. PET/CT scan from 07/16/16 showed: IMPRESSION: 1. Pathologic and hypermetabolic lower retroperitoneal, pelvic, and inguinal adenopathy compatible with Deauville 5 malignancy. 2. Mildly prominent right lower paratracheal lymph node, Deauville 4. 3. Accentuated activity in the right lateral pterygoid muscle without CT correlate, probably incidental, merits attention on any follow up. 4. Other imaging findings of potential clinical significance: Airway thickening is present, suggesting bronchitis or reactive airways disease. Trace left pleural effusion. Coronary and aortoiliac atherosclerosis. Prominent stool throughout the colon favors constipation. Late phase healing fractures of left lower anterior ribs.  8. Bone marrow biopsy from 07/18/2016 did not show any evidence of lymphoma. HIV and hepatitis B and hepatitis C testing was negative  9. Patient received 2 weekly doses of Rituxan on 4/19 and 08/22/2016. She did not follow-up since then. In the interim she developed herpes zoster involving the right abdominal wall. She then developed cellulitis of RLE She is currently in rehabilitation  Interval history- left leg again starting to look red again. Otherwise feels at baseline  ECOG PS- 2-3 Pain scale- 3 Opioid associated constipation- no  Review of systems- Review of Systems  Constitutional: Positive for malaise/fatigue. Negative for chills, fever and weight loss.  HENT: Negative for congestion, ear discharge and  nosebleeds.  Eyes: Negative for blurred vision.  Respiratory: Negative for cough, hemoptysis, sputum production, shortness of breath and wheezing.   Cardiovascular: Positive for leg swelling (and redness LLE). Negative for chest pain, palpitations, orthopnea and claudication.  Gastrointestinal: Negative for abdominal pain, blood in stool, constipation, diarrhea, heartburn, melena, nausea and vomiting.  Genitourinary: Negative for dysuria, flank pain, frequency, hematuria and urgency.  Musculoskeletal: Negative for back pain, joint pain and myalgias.  Skin: Negative for rash.  Neurological: Negative for dizziness, tingling, focal weakness, seizures, weakness and headaches.  Endo/Heme/Allergies: Does not bruise/bleed easily.  Psychiatric/Behavioral: Negative for depression and suicidal ideas. The patient does not have insomnia.        No Known Allergies   Past Medical History:  Diagnosis Date  . ADHD   . Anxiety   . COPD (chronic obstructive pulmonary disease) ([place])   . Depression    major depressive  . Dyspnea    doe  . Edema    left leg  . Follicular lymphoma ([place])    B Cell  . Hypertension   . Hypotension    idiopathic  . Kyphoscoliosis and scoliosis 11/26/2011  . Morbid obesity ([place]) 01/05/2016  . Multiple sclerosis ([place])   . Multiple sclerosis ([place])    1980's  . Neuromuscular disorder ([place])   . Obstructive and reflux uropathy    foley  . Pain    atypical facial  . Peripheral vascular disease of lower extremity with ulceration ([place]) 11/08/2015  . Skin ulcer ([place]) 11/08/2015  . Weakness    generalized. has MS     Past Surgical History:  Procedure Laterality Date  . BACK SURGERY N/A 2002  . CYST EXCISION     lower back  . INGUINAL LYMPH NODE BIOPSY Left 07/04/2016   Procedure: INGUINAL LYMPH NODE BIOPSY;  Surgeon: Christene Lye, MD;  Location: [facility] ORS;  Service: General;  Laterality: Left;  . PORTACATH PLACEMENT N/A 07/22/2016   Procedure: INSERTION  PORT-A-CATH;  Surgeon: Christene Lye, MD;  Location: [facility] ORS;  Service: General;  Laterality: N/A;  . TONSILLECTOMY AND ADENOIDECTOMY    . TUBAL LIGATION      Social History   Social History  . Marital status: Married    Spouse name: N/A  . Number of children: 3  . Years of education: N/A   Occupational History  . disabled    Social History Main Topics  . Smoking status: Former Smoker    Packs/day: 1.00    Years: 20.00    Types: Cigarettes    Quit date: 02/03/2016  . Smokeless tobacco: Never Used  . Alcohol use No  . Drug use: Yes    Types: Marijuana     Comment: smokes THC daily per pt   . Sexual activity: No   Other Topics Concern  . Not on file   Social History Narrative  . No narrative on file    Family History  Problem Relation Age of Onset  . COPD Mother   . Diabetes Mother   . Heart failure Mother   . Alcohol abuse Father   . Kidney disease Father   . Kidney failure Father   . Arthritis Sister   . CAD Maternal Grandmother   . Stroke Maternal Grandfather   . Diabetes Sister   . Arthritis Sister   . Arthritis Brother      Current Outpatient Prescriptions:  .  AVONEX PEN 30 MCG/0.5ML AJKT, Inject 30 mcg into the skin every Monday., Disp: , Rfl:  .  buPROPion (WELLBUTRIN XL) 300 MG 24 hr tablet, Take 300 mg by mouth every morning. , Disp: , Rfl:  .  clonazePAM (KLONOPIN) 1 MG tablet, Take 1 tablet (1 mg total) by mouth at bedtime., Disp: 30 tablet, Rfl: 0 .  DULoxetine (CYMBALTA) 60 MG capsule, Take 1 capsule (60 mg total) by mouth every morning., Disp: 30 capsule, Rfl: 3 .  fluticasone furoate-vilanterol (BREO ELLIPTA) 100-25 MCG/INH AEPB, Inhale 1 puff into the lungs daily., Disp: 1 each, Rfl: 11 .  gabapentin (NEURONTIN) 600 MG tablet, TAKE 1 TABLET (600 MG TOTAL) BY MOUTH 3 (THREE) TIMES DAILY., Disp: 90 tablet, Rfl: 2 .  hydrochlorothiazide (MICROZIDE) 12.5 MG capsule, TAKE ONE CAPSULE (12.5 MG TOTAL) BY MOUTH EVERY DAY (THIS REPLACES  TRIAMTERENE/HCTZ), Disp: 30 capsule, Rfl: 5 .  lisinopril (PRINIVIL,ZESTRIL) 5 MG tablet, Take 1 tablet (5 mg total) by mouth daily., Disp: 30 tablet, Rfl: 0 .  methylphenidate (RITALIN) 10 MG tablet, Take 1 tablet (10 mg total) by mouth 2 (two) times daily., Disp: 30 tablet, Rfl: 0 .  Multiple Vitamin (MULTIVITAMIN WITH MINERALS) TABS tablet, Take 1 tablet by mouth daily., Disp: , Rfl:  .  polyethylene glycol powder (GLYCOLAX/MIRALAX) powder, Take 17 g by mouth daily. Mixed in water, Disp: 850 g, Rfl: 2 .  potassium chloride (K-DUR) 10 MEQ tablet, Take 1 tablet (10 mEq total) by mouth daily., Disp: 90 tablet, Rfl: 1 .  QUEtiapine Fumarate (SEROQUEL XR) 150 MG 24 hr tablet, 150 mg at bedtime. , Disp: , Rfl: 4 .  solifenacin (VESICARE) 5 MG tablet, Take 5 mg by mouth daily., Disp: , Rfl:  .  tiZANidine (ZANAFLEX) 4 MG tablet, Take 1 tablet (4 mg total) by mouth 4 (four) times daily. TAKES FOR MS, Disp: 30 tablet, Rfl: 0 .  topiramate (TOPAMAX) 50 MG tablet, Take 1 tablet (50 mg total) by mouth daily., Disp: 30 tablet, Rfl: 0 .  doxycycline (VIBRA-TABS) 100 MG tablet, Take 1 tablet (100 mg total) by mouth 2 (two) times daily., Disp: 20 tablet, Rfl: 0 .  lidocaine-prilocaine (EMLA) cream, Apply 1 application topically as needed. apply cream over the port site and then place small piece of saran wrap over the cream to protect your clothing, Disp: 30 g, Rfl: 1 .  oxyCODONE (OXY IR/ROXICODONE) 5 MG immediate release tablet, Take 1 tablet (5 mg total) by mouth every 6 (six) hours as needed for moderate pain or severe pain. (Patient not taking: Reported on 10/17/2016), Disp: 20 tablet, Rfl: 0 No current facility-administered medications for this visit.   Facility-Administered Medications Ordered in Other Visits:  .  diphenhydrAMINE (BENADRYL) capsule 50 mg, 50 mg, Oral, Once, Sindy Guadeloupe, MD .  heparin lock flush 100 unit/mL, 500 Units, Intravenous, Once, Sindy Guadeloupe, MD .  heparin lock flush 100  unit/mL, 500 Units, Intracatheter, Once PRN, Sindy Guadeloupe, MD .  riTUXimab (RITUXAN) 900 mg in sodium chloride 0.9 % 160 mL chemo infusion, 375 mg/m2, Intravenous, Once, Sindy Guadeloupe, MD  Physical exam:  Vitals:   11/07/16 0943  BP: 127/81  Pulse: 67  Resp: 20  Temp: (!) 95.2 F (35.1 C)  TempSrc: Tympanic  Weight: 288 lb 4.8 oz (130.8 kg)   Physical Exam  Constitutional: She is oriented to person, place, and time.  She is fatigued, sitting in a wheelchair. No acute distress  HENT:  Head: Normocephalic and atraumatic.  Eyes: Pupils are equal, round, and reactive to light. EOM are normal.  Neck: Normal range  of motion.  Cardiovascular: Normal rate, regular rhythm and normal heart sounds.   Pulmonary/Chest: Effort normal and breath sounds normal.  Abdominal: Soft. Bowel sounds are normal.  Chronic foley in place  Musculoskeletal:  LLE appears red. Local warmth +  Neurological: She is alert and oriented to person, place, and time.  Skin: Skin is warm and dry.     CMP Latest Ref Rng & Units 11/07/2016  Glucose 65 - 99 mg/dL 102(H)  BUN 6 - 20 mg/dL 19  Creatinine 0.44 - 1.00 mg/dL 1.32(H)  Sodium 135 - 145 mmol/L 136  Potassium 3.5 - 5.1 mmol/L 3.5  Chloride 101 - 111 mmol/L 101  CO2 22 - 32 mmol/L 28  Calcium 8.9 - 10.3 mg/dL 10.0  Total Protein 6.5 - 8.1 g/dL 7.0  Total Bilirubin 0.3 - 1.2 mg/dL 0.3  Alkaline Phos 38 - 126 U/L 73  AST 15 - 41 U/L 19  ALT 14 - 54 U/L 17   CBC Latest Ref Rng & Units 11/07/2016  WBC 3.6 - 11.0 K/uL 7.4  Hemoglobin 12.0 - 16.0 g/dL 12.5  Hematocrit 35.0 - 47.0 % 36.3  Platelets 150 - 440 K/uL 281    No images are attached to the encounter.  Ct Abdomen Pelvis W Contrast  Result Date: 11/05/2016 CLINICAL DATA:  Restaging of stage II low-grade follicular lymphoma. EXAM: CT ABDOMEN AND PELVIS WITH CONTRAST TECHNIQUE: Multidetector CT imaging of the abdomen and pelvis was performed using the standard protocol following bolus  administration of intravenous contrast. CONTRAST:  75m ISOVUE-300 IOPAMIDOL (ISOVUE-300) INJECTION 61% COMPARISON:  Multiple exams, including PET-CT from 07/16/2016 FINDINGS: Lower chest: Coronary and descending thoracic aortic atherosclerosis. Borderline cardiomegaly. Hepatobiliary: Contracted gallbladder.  Otherwise unremarkable. Pancreas: Unremarkable Spleen: Unremarkable Adrenals/Urinary Tract: Hypodense 1.6 cm lesion exophytic from the left mid upper kidney, likely a cyst given the fluid density. Adrenal glands normal. Foley catheter is present in the urinary bladder. Suspected 2 mm left kidney lower pole nonobstructive renal calculus on image 38/3. Stomach/Bowel: Unremarkable Vascular/Lymphatic: Lower abdominal retroperitoneal and pelvic adenopathy is observed. Index left external iliac node 4.3 cm in short axis, previously 5.3 cm. The solid portion of a left inguinal lymph node measures 1.7 cm in short axis, formerly 2.8 cm. There is a cystic component of this inguinal lymph node laterally on image 78/3. Aortoiliac atherosclerotic vascular disease. Reproductive: Cystic left adnexal/ ovarian lesion, 4.9 by 4.3 by 6.1 cm (volume = 67 cm^3), this formerly measured 5.9 by 5.7 by 3.9 cm (volume = 69 cm^3) on 06/14/2016, and was not previously hypermetabolic. Other: No supplemental non-categorized findings. Musculoskeletal: Pelvic deformities is specially along the left iliac crest. Multilevel posterolateral rod and pedicle screw fixation. Prior compression fractures at L 3, L2, and especially T12. Posterior decompression between L1 and S1. IMPRESSION: 1. Moderate reduction in size of the lower abdominal and pelvic adenopathy. 2. Overall stable volume of the cyst of the left adnexal lesion. There is probably some internal septation. This was not previously hypermetabolic. In light of the size of the lesion as well as the patient is age, pelvic sonography is recommended to assess potential for ovarian malignancy.  3. Old posttraumatic and postoperative findings in the pelvis and lumbar spine, with multiple compression fractures which are unchanged. 4.  Aortic Atherosclerosis (ICD10-I70.0). 5. Coronary atherosclerosis. 6. Suspected nonobstructive left kidney lower pole nephrolithiasis. Electronically Signed   By: WVan ClinesM.D.   On: 11/05/2016 14:45   UKoreaVenous Img Lower Bilateral  Result Date: 10/11/2016 CLINICAL  DATA:  Bilateral leg swelling and redness EXAM: BILATERAL LOWER EXTREMITY VENOUS DOPPLER ULTRASOUND TECHNIQUE: Gray-scale sonography with graded compression, as well as color Doppler and duplex ultrasound were performed to evaluate the lower extremity deep venous systems from the level of the common femoral vein and including the common femoral, femoral, profunda femoral, popliteal and calf veins including the posterior tibial, peroneal and gastrocnemius veins when visible. The superficial great saphenous vein was also interrogated. Spectral Doppler was utilized to evaluate flow at rest and with distal augmentation maneuvers in the common femoral, femoral and popliteal veins. COMPARISON:  08/28/2016 FINDINGS: RIGHT LOWER EXTREMITY Common Femoral Vein: No evidence of thrombus. Normal compressibility, respiratory phasicity and response to augmentation. Saphenofemoral Junction: No evidence of thrombus. Normal compressibility and flow on color Doppler imaging. Profunda Femoral Vein: No evidence of thrombus. Normal compressibility and flow on color Doppler imaging. Femoral Vein: No evidence of thrombus. Normal compressibility, respiratory phasicity and response to augmentation. Popliteal Vein: No evidence of thrombus. Normal compressibility, respiratory phasicity and response to augmentation. Calf Veins: No evidence of thrombus. Normal compressibility and flow on color Doppler imaging. Other Findings:  Soft tissue edema is present LEFT LOWER EXTREMITY Common Femoral Vein: No evidence of thrombus. Normal  compressibility, respiratory phasicity and response to augmentation. Saphenofemoral Junction: No evidence of thrombus. Normal compressibility and flow on color Doppler imaging. Profunda Femoral Vein: No evidence of thrombus. Normal compressibility and flow on color Doppler imaging. Femoral Vein: No evidence of thrombus. Normal compressibility, respiratory phasicity and response to augmentation. Popliteal Vein: No evidence of thrombus. Normal compressibility, respiratory phasicity and response to augmentation. Calf Veins: No evidence of thrombus. Normal compressibility and flow on color Doppler imaging. Other Findings: Soft tissue edema is present. Again visualized is a cystic mass in the left groin measuring 5.9 x 4.5 x 4.9 cm, decreased compared to prior maximum measurement of 7 cm. IMPRESSION: No evidence of DVT within either lower extremity. Slight decrease in size of a cystic appearing mass in the left groin. Electronically Signed   By: Donavan Foil M.D.   On: 10/11/2016 21:39     Assessment and plan- Patient is a 66 y.o. female with Stage II follicular lymphoma  Will proceed with rituxan today. This will be her 5th dose. I have discussed her case at tumor board as well. Consensus was to proceed with radiation therapy followed by observation. I will see her back in 2 weeks with cbc,cmp  LLE redness- possible cellulitis. Will give 10 days of oral doxy  Ovarian cyst noted on CT- refer to Gyn for further evaluation. She goes to [facility]   Visit Diagnosis 1. Follicular lymphoma of intra-abdominal lymph nodes, unspecified follicular lymphoma type ([place])   2. Encounter for antineoplastic immunotherapy   3. Cellulitis of left lower extremity      Dr. Randa Evens, MD, MPH [facility] Pager- [phone] 11/07/2016 1:39 PM

## 2016-11-07 NOTE — Telephone Encounter (Signed)
Patient is being referred by [facility] fro Follicular lymphoma of intra-abdominal lymph nodes, unspecified follicular lymphoma type ([place]). Please schedule when patient calls back. Voicemail not setup

## 2016-11-07 NOTE — Patient Outreach (Signed)
[facility]) Care Management [place] Telephone Outreach, Transition of Care day 7  11/07/2016  HANIN DECOOK [birth year] [phone]  Unsuccessful telephone outreach to [name], 66 y/o female followed by [place] for transition of care after recent hospitalization June 15-18, 2018 for (L) LE swelling/ cellulitis; patient was discharged to SNF for rehabilitation and was subsequently discharged from [facility] October 29, 2016.  Patient has history including, but not limited to, COPD, HTN, MS, CKD- stg III, and cancer (follicular lymphoma stage 2).  Received automated outgoing VM msg stating, "the person you are trying to reach has a voice mail box that has not been set up yet."  Unable to leave patient HIPAA compliant voice mail message.  Plan:  Will re-attempt [facility] CM telephone outreach tomorrowif I do not hear back from patient first.  Oneta Rack, RN, BSN, [place] Coordinator [facility] Care Management  [phone]

## 2016-11-08 ENCOUNTER — Other Ambulatory Visit: Payer: Self-pay | Admitting: *Deleted

## 2016-11-08 ENCOUNTER — Encounter: Payer: Self-pay | Admitting: *Deleted

## 2016-11-08 DIAGNOSIS — G43909 Migraine, unspecified, not intractable, without status migrainosus: Secondary | ICD-10-CM | POA: Diagnosis not present

## 2016-11-08 DIAGNOSIS — I2729 Other secondary pulmonary hypertension: Secondary | ICD-10-CM | POA: Diagnosis not present

## 2016-11-08 DIAGNOSIS — G629 Polyneuropathy, unspecified: Secondary | ICD-10-CM | POA: Diagnosis not present

## 2016-11-08 DIAGNOSIS — M1990 Unspecified osteoarthritis, unspecified site: Secondary | ICD-10-CM | POA: Diagnosis not present

## 2016-11-08 DIAGNOSIS — J449 Chronic obstructive pulmonary disease, unspecified: Secondary | ICD-10-CM | POA: Diagnosis not present

## 2016-11-08 DIAGNOSIS — G35 Multiple sclerosis: Secondary | ICD-10-CM | POA: Diagnosis not present

## 2016-11-08 DIAGNOSIS — I739 Peripheral vascular disease, unspecified: Secondary | ICD-10-CM | POA: Diagnosis not present

## 2016-11-08 DIAGNOSIS — I10 Essential (primary) hypertension: Secondary | ICD-10-CM | POA: Diagnosis not present

## 2016-11-08 DIAGNOSIS — M5416 Radiculopathy, lumbar region: Secondary | ICD-10-CM | POA: Diagnosis not present

## 2016-11-08 DIAGNOSIS — G4733 Obstructive sleep apnea (adult) (pediatric): Secondary | ICD-10-CM | POA: Diagnosis not present

## 2016-11-08 DIAGNOSIS — E559 Vitamin D deficiency, unspecified: Secondary | ICD-10-CM | POA: Diagnosis not present

## 2016-11-08 NOTE — Patient Outreach (Addendum)
[facility]) Care Management [place] Telephone Outreach, Transition of Care day 8   11/08/2016  [name] 17-Jun-1950 [id number]  Successful telephone outreach to [name], 66 y/o female followed by [place] for transition of care after recent hospitalization June 15-18, 2018 for (L) LE swelling/ cellulitis; patient was discharged to SNF for rehabilitation and was subsequently discharged from [facility] October 29, 2016.  Patient has history including, but not limited to, COPD, HTN, MS, CKD- stg III, and cancer (follicular lymphoma stage 2).  HIPAA/ identity verified with patient during phone call today.  I explained that I was contacting patient for primary [facility] RN CM [name], and atient verbalized understanding.  Today, patient reports that she is "doing pretty good," and she denies pain, concerns, or problems; patient states that is "working with the home health PT now," and states that she is unable to talk for very long today due to [facility] PT (through [facility]) session actively in progress.  -- reports has all medications/ is taking as prescribed; reports continuing to take antibiotics as prescribed.  Denies questions/ concerns around medications. -- reports (L) LE "looks about the same," denies questions/ concerns with (L) lower extremity -- attended [place] appointments yesterday, "they went well."  Denies changes to overall plan of care  Patient denies further issues, concerns, or problems today.  I confirmed that patient has the main [facility] CM office phone number, and the [facility] CM 24-hour nurse advice phone number should issues arise prior to next scheduled [place] outreach, and explained that [facility] would contact patient again next week for ongoing transition of care; patient verbalized understanding and agreement with this plan.  Plan:  Will update patient's primary [facility] RN CM of today's successful telephone outreach to patient  Oneta Rack, RN, BSN, [place] Coordinator [facility] Care Management  [phone]

## 2016-11-11 ENCOUNTER — Encounter: Payer: Self-pay | Admitting: Family Medicine

## 2016-11-11 ENCOUNTER — Ambulatory Visit (INDEPENDENT_AMBULATORY_CARE_PROVIDER_SITE_OTHER): Payer: Medicare Other | Admitting: Family Medicine

## 2016-11-11 VITALS — BP 122/74 | HR 96 | Temp 98.3°F | Resp 16 | Wt 290.5 lb

## 2016-11-11 DIAGNOSIS — M1990 Unspecified osteoarthritis, unspecified site: Secondary | ICD-10-CM | POA: Diagnosis not present

## 2016-11-11 DIAGNOSIS — Z1239 Encounter for other screening for malignant neoplasm of breast: Secondary | ICD-10-CM

## 2016-11-11 DIAGNOSIS — I700 Atherosclerosis of aorta: Secondary | ICD-10-CM | POA: Insufficient documentation

## 2016-11-11 DIAGNOSIS — M7989 Other specified soft tissue disorders: Secondary | ICD-10-CM | POA: Diagnosis not present

## 2016-11-11 DIAGNOSIS — G4733 Obstructive sleep apnea (adult) (pediatric): Secondary | ICD-10-CM | POA: Diagnosis not present

## 2016-11-11 DIAGNOSIS — Z13820 Encounter for screening for osteoporosis: Secondary | ICD-10-CM | POA: Diagnosis not present

## 2016-11-11 DIAGNOSIS — J449 Chronic obstructive pulmonary disease, unspecified: Secondary | ICD-10-CM | POA: Diagnosis not present

## 2016-11-11 DIAGNOSIS — N183 Chronic kidney disease, stage 3 unspecified: Secondary | ICD-10-CM

## 2016-11-11 DIAGNOSIS — I739 Peripheral vascular disease, unspecified: Secondary | ICD-10-CM | POA: Diagnosis not present

## 2016-11-11 DIAGNOSIS — I10 Essential (primary) hypertension: Secondary | ICD-10-CM | POA: Diagnosis not present

## 2016-11-11 DIAGNOSIS — G629 Polyneuropathy, unspecified: Secondary | ICD-10-CM | POA: Diagnosis not present

## 2016-11-11 DIAGNOSIS — I129 Hypertensive chronic kidney disease with stage 1 through stage 4 chronic kidney disease, or unspecified chronic kidney disease: Secondary | ICD-10-CM | POA: Diagnosis not present

## 2016-11-11 DIAGNOSIS — E786 Lipoprotein deficiency: Secondary | ICD-10-CM | POA: Diagnosis not present

## 2016-11-11 DIAGNOSIS — C8293 Follicular lymphoma, unspecified, intra-abdominal lymph nodes: Secondary | ICD-10-CM

## 2016-11-11 DIAGNOSIS — M5416 Radiculopathy, lumbar region: Secondary | ICD-10-CM | POA: Diagnosis not present

## 2016-11-11 DIAGNOSIS — G35 Multiple sclerosis: Secondary | ICD-10-CM | POA: Diagnosis not present

## 2016-11-11 DIAGNOSIS — Z1231 Encounter for screening mammogram for malignant neoplasm of breast: Secondary | ICD-10-CM | POA: Diagnosis not present

## 2016-11-11 DIAGNOSIS — I2729 Other secondary pulmonary hypertension: Secondary | ICD-10-CM | POA: Diagnosis not present

## 2016-11-11 HISTORY — DX: Atherosclerosis of aorta: I70.0

## 2016-11-11 NOTE — Patient Instructions (Addendum)
Please ask the staff at the cancer center to check a fasting cholesterol panel when you get blood drawn next time Try to limit saturated fats in your diet (bologna, hot dogs, barbeque, cheeseburgers, hamburgers, steak, bacon, sausage, cheese, etc.) and get more fresh fruits, vegetables, and whole grains Let me know if there are things we can do to help

## 2016-11-11 NOTE — Assessment & Plan Note (Signed)
Avoiding NSAIDs; reviewed last Cr and GFR

## 2016-11-11 NOTE — Assessment & Plan Note (Signed)
Obese; not able to exercise; check lipids fasting with next blood draw

## 2016-11-11 NOTE — Assessment & Plan Note (Signed)
Showed model of aorta at varying stages of disease; try to limit saturated fats, more veggies and fruits

## 2016-11-11 NOTE — Progress Notes (Signed)
BP 122/74   Pulse 96   Temp 98.3 F (36.8 C) (Oral)   Resp 16   Wt 290 lb 8 oz (131.8 kg)   SpO2 95%   BMI 51.46 kg/m    Subjective:    Patient ID: [name], female    DOB: [birth year], 66 y.o.   MRN: [id number]  HPI: ILEEN [name] is a 66 y.o. female  Chief Complaint  Patient presents with  . Rehab Follow-up    HPI Patient was discharged from SNF on October 29, 2016 One-story home Adequate bathroom facilities; has bedside commode; rolling walker; she has a great big shower, walk-in, still uses shower stool; grab bars; steps to get into home, there is a ramp, two steps to get into dining room, a little hard to do but she manages She doesn't have fall alert bracelet She is left alone at times, but then she stays in one spot, not maneuvering much She has staff coming in to help her; patient aide helps her shower and gets dressed; wearing anti-embolism stockings today because of her; her husband can put them on but doesn't like to, it's hard for him; has the wire to help put it on but can't find it She is safe at home; husband is forgetful (Parkinson's) Has to sleep in the lift chair; not wanting to go to nursing home; no room at home for hospital bed; living room is full (husband is a Ship broker)  She has lymphoma and was referred by the cancer center to GYN for cyst on the ovary CT abd pelvis November 05, 2016: IMPRESSION: 1. Moderate reduction in size of the lower abdominal and pelvic adenopathy. 2. Overall stable volume of the cyst of the left adnexal lesion. There is probably some internal septation. This was not previously hypermetabolic. In light of the size of the lesion as well as the patient is age, pelvic sonography is recommended to assess potential for ovarian malignancy. 3. Old posttraumatic and postoperative findings in the pelvis and lumbar spine, with multiple compression fractures which are unchanged. 4.  Aortic Atherosclerosis (ICD10-I70.0). 5. Coronary  atherosclerosis. 6. Suspected nonobstructive left kidney lower pole nephrolithiasis.   Electronically Signed   By: Van Clines M.D.   On: 11/05/2016 14:45 -------------------------------------- Abd aortic atherosclerosis; not much exercise growing up; smoked Last lipid panel was November 08, 2015 HDL 29, LDL 94  Labs labs July 12th; kidney function improved slightly, Cr down from 1.42 to 1.32; GFR improved from 38 to 41 No NSAIDs Last A1c was 5.5, normal   Depression screen [facility] 2/9 11/11/2016 10/09/2016 08/02/2016 06/18/2016 06/07/2016  Decreased Interest 1 0 0 0 0  Down, Depressed, Hopeless 3 0 1 0 1  PHQ - 2 Score 4 0 1 0 1  Altered sleeping - - - - -  Tired, decreased energy 3 - - - -  Change in appetite 1 - - - -  Feeling bad or failure about yourself  1 - - - -  Trouble concentrating 1 - - - -  Moving slowly or fidgety/restless 1 - - - -  Suicidal thoughts 0 - - - -  PHQ-9 Score - - - - -  Difficult doing work/chores Somewhat difficult - - - -    Relevant past medical, surgical, family and social history reviewed Past Medical History:  Diagnosis Date  . Abdominal aortic atherosclerosis ([place]) 11/11/2016  . ADHD   . Anxiety   . COPD (chronic obstructive pulmonary disease) ([place])   .  Depression    major depressive  . Dyspnea    doe  . Edema    left leg  . Follicular lymphoma ([place])    B Cell  . Hypertension   . Hypotension    idiopathic  . Kyphoscoliosis and scoliosis 11/26/2011  . Morbid obesity ([place]) 01/05/2016  . Multiple sclerosis ([place])   . Multiple sclerosis ([place])    1980's  . Neuromuscular disorder ([place])   . Obstructive and reflux uropathy    foley  . Pain    atypical facial  . Peripheral vascular disease of lower extremity with ulceration ([place]) 11/08/2015  . Skin ulcer ([place]) 11/08/2015  . Weakness    generalized. has MS   Past Surgical History:  Procedure Laterality Date  . BACK SURGERY N/A 2002  . CYST EXCISION     lower back  . INGUINAL LYMPH  NODE BIOPSY Left 07/04/2016   Procedure: INGUINAL LYMPH NODE BIOPSY;  Surgeon: Christene Lye, MD;  Location: [facility] ORS;  Service: General;  Laterality: Left;  . PORTACATH PLACEMENT N/A 07/22/2016   Procedure: INSERTION PORT-A-CATH;  Surgeon: Christene Lye, MD;  Location: [facility] ORS;  Service: General;  Laterality: N/A;  . TONSILLECTOMY AND ADENOIDECTOMY    . TUBAL LIGATION     Family History  Problem Relation Age of Onset  . COPD Mother   . Diabetes Mother   . Heart failure Mother   . Alcohol abuse Father   . Kidney disease Father   . Kidney failure Father   . Arthritis Sister   . CAD Maternal Grandmother   . Stroke Maternal Grandfather   . Diabetes Sister   . Arthritis Sister   . Arthritis Brother    Social History   Social History  . Marital status: Married    Spouse name: N/A  . Number of children: 3  . Years of education: N/A   Occupational History  . disabled    Social History Main Topics  . Smoking status: Former Smoker    Packs/day: 1.00    Years: 20.00    Types: Cigarettes    Quit date: 02/03/2016  . Smokeless tobacco: Never Used  . Alcohol use No  . Drug use: Yes    Types: Marijuana     Comment: smokes THC daily per pt   . Sexual activity: No   Other Topics Concern  . Not on file   Social History Narrative  . No narrative on file    Interim medical history since last visit reviewed. Allergies and medications reviewed  Review of Systems Per HPI unless specifically indicated above     Objective:    BP 122/74   Pulse 96   Temp 98.3 F (36.8 C) (Oral)   Resp 16   Wt 290 lb 8 oz (131.8 kg)   SpO2 95%   BMI 51.46 kg/m   Wt Readings from Last 3 Encounters:  11/11/16 290 lb 8 oz (131.8 kg)  11/07/16 288 lb 4.8 oz (130.8 kg)  10/17/16 289 lb (131.1 kg)    Physical Exam  Constitutional: She appears well-developed and well-nourished. No distress.  Obese, weight not measured today  HENT:  Scab on the scalp  Eyes: No scleral  icterus.  Neck: No thyromegaly present.  Cardiovascular: Normal rate and regular rhythm.   Pulmonary/Chest: Effort normal and breath sounds normal.  Abdominal: Bowel sounds are normal. She exhibits no distension.  Musculoskeletal: She exhibits no edema (wearing anti-embolism stockings).  Left lower leg: She exhibits swelling.  Erythema and increased warmth of the left leg, tight with peau d'orange changes; left leg is visibly larger than the right leg; no weeping or oozing but there is a scab laterally  Neurological:  Seated in wheelchair, gait not assessed  Skin: No pallor.  Psychiatric: She does not exhibit a depressed mood.  Not tearful, good eye contact with examiner, affect somewhat blunted   Results for orders placed or performed in visit on 11/07/16  CBC with Differential  Result Value Ref Range   WBC 7.4 3.6 - 11.0 K/uL   RBC 4.38 3.80 - 5.20 MIL/uL   Hemoglobin 12.5 12.0 - 16.0 g/dL   HCT 36.3 35.0 - 47.0 %   MCV 82.9 80.0 - 100.0 fL   MCH 28.6 26.0 - 34.0 pg   MCHC 34.5 32.0 - 36.0 g/dL   RDW 14.8 (H) 11.5 - 14.5 %   Platelets 281 150 - 440 K/uL   Neutrophils Relative % 72 %   Neutro Abs 5.3 1.4 - 6.5 K/uL   Lymphocytes Relative 14 %   Lymphs Abs 1.0 1.0 - 3.6 K/uL   Monocytes Relative 7 %   Monocytes Absolute 0.5 0.2 - 0.9 K/uL   Eosinophils Relative 6 %   Eosinophils Absolute 0.5 0 - 0.7 K/uL   Basophils Relative 1 %   Basophils Absolute 0.1 0 - 0.1 K/uL  Comprehensive metabolic panel  Result Value Ref Range   Sodium 136 135 - 145 mmol/L   Potassium 3.5 3.5 - 5.1 mmol/L   Chloride 101 101 - 111 mmol/L   CO2 28 22 - 32 mmol/L   Glucose, Bld 102 (H) 65 - 99 mg/dL   BUN 19 6 - 20 mg/dL   Creatinine, Ser 1.32 (H) 0.44 - 1.00 mg/dL   Calcium 10.0 8.9 - 10.3 mg/dL   Total Protein 7.0 6.5 - 8.1 g/dL   Albumin 3.7 3.5 - 5.0 g/dL   AST 19 15 - 41 U/L   ALT 17 14 - 54 U/L   Alkaline Phosphatase 73 38 - 126 U/L   Total Bilirubin 0.3 0.3 - 1.2 mg/dL   GFR calc  non Af Amer 41 (L) >60 mL/min   GFR calc Af Amer 48 (L) >60 mL/min   Anion gap 7 5 - 15      Assessment & Plan:   Problem List Items Addressed This Visit      Cardiovascular and Mediastinum   Hypertension goal BP (blood pressure) < 140/90    Well-controlled today; not checking at home; try to limit salt      Abdominal aortic atherosclerosis (HCC) - Primary    Showed model of aorta at varying stages of disease; try to limit saturated fats, more veggies and fruits      Relevant Orders   Lipid panel     Immune and Lymphatic   Follicular lymphoma of intra-abdominal lymph nodes (HCC)    Managed by oncologist        Genitourinary   CKD (chronic kidney disease) stage 3, GFR 30-59 ml/min    Avoiding NSAIDs; reviewed last Cr and GFR        Other   Swelling of lower extremity    Wearing anti-embolism stockings      Low HDL (under 40)    Obese; not able to exercise; check lipids fasting with next blood draw      Relevant Orders   Lipid panel    Other Visit  Diagnoses    Screening for breast cancer       Relevant Orders   MM Digital Screening   Screening for osteoporosis       Relevant Orders   DG Bone Density       Follow up plan: Return in about 4 months (around 03/14/2017) for follow-up visit with Dr. Sanda Klein.  An after-visit summary was printed and given to the patient at [place].  Please see the patient instructions which may contain other information and recommendations beyond what is mentioned above in the assessment and plan.  No orders of the defined types were placed in this encounter.   Orders Placed This Encounter  Procedures  . MM Digital Screening  . DG Bone Density  . Lipid panel

## 2016-11-11 NOTE — Assessment & Plan Note (Signed)
Wearing anti-embolism stockings

## 2016-11-11 NOTE — Assessment & Plan Note (Signed)
Well-controlled today; not checking at home; try to limit salt

## 2016-11-11 NOTE — Assessment & Plan Note (Signed)
Managed by oncologist 

## 2016-11-12 ENCOUNTER — Other Ambulatory Visit: Payer: Self-pay | Admitting: *Deleted

## 2016-11-12 ENCOUNTER — Encounter: Payer: Self-pay | Admitting: *Deleted

## 2016-11-12 ENCOUNTER — Other Ambulatory Visit: Payer: Self-pay

## 2016-11-12 DIAGNOSIS — E559 Vitamin D deficiency, unspecified: Secondary | ICD-10-CM | POA: Diagnosis not present

## 2016-11-12 DIAGNOSIS — J449 Chronic obstructive pulmonary disease, unspecified: Secondary | ICD-10-CM | POA: Diagnosis not present

## 2016-11-12 DIAGNOSIS — M5416 Radiculopathy, lumbar region: Secondary | ICD-10-CM | POA: Diagnosis not present

## 2016-11-12 DIAGNOSIS — G4733 Obstructive sleep apnea (adult) (pediatric): Secondary | ICD-10-CM | POA: Diagnosis not present

## 2016-11-12 DIAGNOSIS — I2729 Other secondary pulmonary hypertension: Secondary | ICD-10-CM | POA: Diagnosis not present

## 2016-11-12 DIAGNOSIS — G35 Multiple sclerosis: Secondary | ICD-10-CM | POA: Diagnosis not present

## 2016-11-12 DIAGNOSIS — M1990 Unspecified osteoarthritis, unspecified site: Secondary | ICD-10-CM | POA: Diagnosis not present

## 2016-11-12 DIAGNOSIS — I10 Essential (primary) hypertension: Secondary | ICD-10-CM | POA: Diagnosis not present

## 2016-11-12 DIAGNOSIS — G629 Polyneuropathy, unspecified: Secondary | ICD-10-CM | POA: Diagnosis not present

## 2016-11-12 DIAGNOSIS — I739 Peripheral vascular disease, unspecified: Secondary | ICD-10-CM | POA: Diagnosis not present

## 2016-11-12 DIAGNOSIS — G43909 Migraine, unspecified, not intractable, without status migrainosus: Secondary | ICD-10-CM | POA: Diagnosis not present

## 2016-11-12 NOTE — Telephone Encounter (Signed)
She should be taking microzide (hydrochlorothiazide) plus lisinopril Please make sure she is taking both of those Monitor BP and let us know if not consistently under 140/90 I recommend that she NOT take methylphenidate (Ritalin) if her blood pressure is high because of stroke and heart attack risk, and encourage her to discuss that with her prescriber (we do not prescribe that drug for her)

## 2016-11-12 NOTE — Telephone Encounter (Signed)
[name], Case manger For [facility] that handles pt transferring care visit states she went over her medication and saw that lisinopril is on the list but not at home.  We looked over chart and saw that it was printed and given to her when she was admitted in June 2018 by Dr. Anselm Jungling. She has not taken this medication at all due to not taking it to her pharmacy. Pt BP was 150 /90 around 12:30pm today  but pt admitted she did not take her MICROZIDE. [name] asking do you want her to stay on the lisinopril? If you have any question her call back number is [phone]

## 2016-11-12 NOTE — Patient Outreach (Signed)
[facility]) Care Management   11/12/2016  GERARDINE [name] [birth year] [id number]  [name] is an 66 y.o. female  Subjective:  Pt reports since SNF discharge been sleeping in lift chair, elevate feet.  Pt reports  On chronic pain in left leg and back- +2 on pain scale, taking OTC pain medication which helps. Pt reports she cannot keep prescription pain medication in her home, goes missing, on return  Home from SNF found some of her clonidine missing (suspect friend took it), had to cut a week's Worth of the medication in half to finish the month.  Pt reports recently got up,  Feel some  warmth  in left lower leg/has difficulty getting anti embolism stockings on.  Review of pt's medications  Found pt did not have Lisinopril 5 mg to which pt reports has not been taking, does not have a  Prescription.   Objective:   Vitals:   11/12/16 1156  BP: (!) 150/90  Pulse: 73  Resp: 16    ROS  Physical Exam  Constitutional: She is oriented to person, place, and time. She appears well-developed and well-nourished.  Cardiovascular: Normal rate and regular rhythm.   Respiratory: Effort normal and breath sounds normal.  GI: Soft. Bowel sounds are normal.  Musculoskeletal: She exhibits edema.  +2 edema top of left foot.  Uses walker when ambulating.   Neurological: She is alert and oriented to person, place, and time.  Skin: Skin is warm and dry.  Left lower leg slightly warmer than right, no redness noted.   Psychiatric: She has a normal mood and affect. Her behavior is normal. Judgment and thought content normal.    Encounter Medications:  Patient was recently discharged from hospital and all medications have been reviewed. Outpatient Encounter Prescriptions as of 11/12/2016  Medication Sig Note  . AVONEX PEN 30 MCG/0.5ML AJKT Inject 30 mcg into the skin every Monday.   Marland Kitchen buPROPion (WELLBUTRIN XL) 300 MG 24 hr tablet Take 300 mg by mouth every morning.  11/04/2016: Needs  refill  . clonazePAM (KLONOPIN) 1 MG tablet Take 1 tablet (1 mg total) by mouth at bedtime.   Marland Kitchen doxycycline (VIBRA-TABS) 100 MG tablet Take 1 tablet (100 mg total) by mouth 2 (two) times daily.   . DULoxetine (CYMBALTA) 60 MG capsule Take 1 capsule (60 mg total) by mouth every morning.   . fluticasone furoate-vilanterol (BREO ELLIPTA) 100-25 MCG/INH AEPB Inhale 1 puff into the lungs daily. 08/28/2016: Pt uses as needed  . gabapentin (NEURONTIN) 600 MG tablet TAKE 1 TABLET (600 MG TOTAL) BY MOUTH 3 (THREE) TIMES DAILY.   . hydrochlorothiazide (MICROZIDE) 12.5 MG capsule TAKE ONE CAPSULE (12.5 MG TOTAL) BY MOUTH EVERY DAY (THIS REPLACES TRIAMTERENE/HCTZ)   . lidocaine-prilocaine (EMLA) cream Apply 1 application topically as needed. apply cream over the port site and then place small piece of saran wrap over the cream to protect your clothing   . lisinopril (PRINIVIL,ZESTRIL) 5 MG tablet Take 1 tablet (5 mg total) by mouth daily.   . methylphenidate (RITALIN) 10 MG tablet Take 1 tablet (10 mg total) by mouth 2 (two) times daily.   . Multiple Vitamin (MULTIVITAMIN WITH MINERALS) TABS tablet Take 1 tablet by mouth daily.   . polyethylene glycol powder (GLYCOLAX/MIRALAX) powder Take 17 g by mouth daily. Mixed in water   . potassium chloride (K-DUR) 10 MEQ tablet Take 1 tablet (10 mEq total) by mouth daily. 07/02/2016: 0900  . QUEtiapine Fumarate (SEROQUEL XR) 150  MG 24 hr tablet 150 mg at bedtime.    . solifenacin (VESICARE) 5 MG tablet Take 5 mg by mouth daily. 10/09/2016: Patient taking 10 mg by mouth daily  . tiZANidine (ZANAFLEX) 4 MG tablet Take 1 tablet (4 mg total) by mouth 4 (four) times daily. TAKES FOR MS   . topiramate (TOPAMAX) 50 MG tablet Take 1 tablet (50 mg total) by mouth daily.    Facility-Administered Encounter Medications as of 11/12/2016  Medication  . heparin lock flush 100 unit/mL    Functional Status:   In your present state of health, do you have any difficulty performing the  following activities: 11/12/2016 11/11/2016  Hearing? N N  Vision? Y Y  Difficulty concentrating or making decisions? N N  Walking or climbing stairs? Y Y  Dressing or bathing? Y Y  Doing errands, shopping? [name]  Preparing Food and eating ? Y -  Using the Toilet? N -  In the past six months, have you accidently leaked urine? N -  Do you have problems with loss of bowel control? Y -  Managing your Medications? N -  Managing your Finances? Y -  Housekeeping or managing your Housekeeping? Y -  Some recent data might be hidden    Fall/Depression Screening:    Fall Risk  11/11/2016 11/04/2016 10/09/2016  Falls in the past year? Yes Yes Yes  Number falls in past yr: 2 or more 2 or more 2 or more  Injury with Fall? No No No  Risk Factor Category  High Fall Risk High Fall Risk -  Risk for fall due to : - History of fall(s);Impaired balance/gait;Impaired mobility;Medication side effect Impaired balance/gait;Impaired mobility;History of fall(s)  Follow up - Falls prevention discussed -   PHQ 2/9 Scores 11/11/2016 10/09/2016 08/02/2016 06/18/2016 06/07/2016 12/22/2015 11/08/2015  PHQ - 2 Score 4 0 1 0 1 6 4   PHQ- 9 Score - - - - - 15 16    Assessment:  Pleasant 66 year old female, lives with spouse (caregiver).  Needs use of walker when Ambulating, unsteady on feet when standing without.   Skin integrity:  Recent hospitalization for cellulitis left lower extremity- left lower extremity slightly    warmer than right, +2 edema noted top of left foot.   Medication issue:  Review of pt's medications found pt did not have Lisinopril - listed on medication      Profile.   Called pt's pharmacy in [place] (main pharmacy, delivers) as well as local [facility]-     Informed by both no prescription on file.   COPD:  Lungs clear, no complaints of sob. Hypertension:  BP today 150/90.  Discussed with pt ongoing adherence with Low Na+ diet.   Plan:  RN CM to call Dr. Delight Ovens office report  pt does not have   Lisinopril/ current BP reading.              As discussed with pt, plan to continue to follow for transition of care, follow up again next                Week telephonically.            Plan to send Dr. Sanda Klein 11/12/16 home visit encounter by in basket.    THN CM Care Plan Problem One     Most Recent Value  Care Plan Problem One  Risk for readmission related to recent SNF and hospital discharge.  [late entry- assessed during 11/12/16 home visit ]  Role Documenting the Problem One  Care Management Coordinator  Care Plan for Problem One  Active  THN Long Term Goal   Pt would not readmit to the hospital within the next 31 days   THN Long Term Goal Start Date  11/01/16  Interventions for Problem One Long Term Goal  initial home visit done- assessed LLE status, reviewed medications, recent MD follow up visit   THN CM Short Term Goal #1   Pt will keep all MD appointments in the next 30 days   THN CM Short Term Goal #1 Start Date  11/01/16  Interventions for Short Term Goal #1  Reviewed with pt recent PCP follow up visit as viewed in EMR, pt reporting   THN CM Short Term Goal #2   Pt will take all medications as ordered for the next 30 days   THN CM Short Term Goal #2 Start Date  11/01/16  Interventions for Short Term Goal #2  Reviewed pt's medicatiions, found did not have Lisinopril, pt's 2 pharmacies called/no prescriptions/ MD office called - reported to MD's RN med on profile, pt does not have/no prescrpiton at pharmacies.      Zara Chess.   [place] Care Management  [phone]

## 2016-11-13 ENCOUNTER — Telehealth: Payer: Self-pay | Admitting: Family Medicine

## 2016-11-13 ENCOUNTER — Other Ambulatory Visit: Payer: Self-pay | Admitting: *Deleted

## 2016-11-13 DIAGNOSIS — G35 Multiple sclerosis: Secondary | ICD-10-CM | POA: Diagnosis not present

## 2016-11-13 DIAGNOSIS — J449 Chronic obstructive pulmonary disease, unspecified: Secondary | ICD-10-CM | POA: Diagnosis not present

## 2016-11-13 DIAGNOSIS — C8293 Follicular lymphoma, unspecified, intra-abdominal lymph nodes: Secondary | ICD-10-CM | POA: Diagnosis not present

## 2016-11-13 DIAGNOSIS — M1990 Unspecified osteoarthritis, unspecified site: Secondary | ICD-10-CM | POA: Diagnosis not present

## 2016-11-13 DIAGNOSIS — I2729 Other secondary pulmonary hypertension: Secondary | ICD-10-CM | POA: Diagnosis not present

## 2016-11-13 DIAGNOSIS — I739 Peripheral vascular disease, unspecified: Secondary | ICD-10-CM | POA: Diagnosis not present

## 2016-11-13 DIAGNOSIS — M5416 Radiculopathy, lumbar region: Secondary | ICD-10-CM | POA: Diagnosis not present

## 2016-11-13 DIAGNOSIS — N183 Chronic kidney disease, stage 3 (moderate): Secondary | ICD-10-CM | POA: Diagnosis not present

## 2016-11-13 DIAGNOSIS — G4733 Obstructive sleep apnea (adult) (pediatric): Secondary | ICD-10-CM | POA: Diagnosis not present

## 2016-11-13 DIAGNOSIS — G629 Polyneuropathy, unspecified: Secondary | ICD-10-CM | POA: Diagnosis not present

## 2016-11-13 DIAGNOSIS — I129 Hypertensive chronic kidney disease with stage 1 through stage 4 chronic kidney disease, or unspecified chronic kidney disease: Secondary | ICD-10-CM | POA: Diagnosis not present

## 2016-11-13 MED ORDER — LISINOPRIL 5 MG PO TABS
5.0000 mg | ORAL_TABLET | Freq: Every day | ORAL | 1 refills | Status: DC
Start: 2016-11-13 — End: 2017-04-05

## 2016-11-13 NOTE — Telephone Encounter (Signed)
rx sent

## 2016-11-13 NOTE — Telephone Encounter (Signed)
Attempted to reach patient unable to leave voicmail

## 2016-11-13 NOTE — Telephone Encounter (Signed)
Pt states RX for lisinopril has been misplace. Could you send a new RX to her [facility]

## 2016-11-13 NOTE — Patient Outreach (Signed)
[facility]) Care Management  11/13/2016  [name] 12/09/1950 [id number]   Phone call to patient to confirm that she has made contact with [facility] to start the 30 day plan of care. Per patient, they started this week and it has been very helpful. Per patient, they came to help her with a bath and get dressed. She was very appreciative of the assistance. This social worker reminded patient of the 30 day limit and that a long term care plan will have to be in place.  Plan: Home visit scheduled for 12/05/16 to discuss long term plan for ongoing care and assistance in the home.   [name] [facility] Care Management [phone]

## 2016-11-13 NOTE — Patient Outreach (Signed)
Note:  Pt's  11/12/16 home visit encounter with this RN CM  sent to Dr. Sanda Klein.     Zara Chess.   [place] Care Management  [phone]

## 2016-11-13 NOTE — Telephone Encounter (Signed)
-   Received call from [name]'s home physical therapist, Lovey Newcomer, with [employer].  She arrived at her home 74% SpO2 initially, did pursed-lip breathing and O2 came up to 95%, after PT session she rechecked and level was again low at 76%. She wanted to call EMS, but patient refused, so she called our office for advice.  -I spoke with both Lovey Newcomer and [name], and [name] adamantly refused to accept EMS transport or POV transport to the ER for low O2 despite my recommendation to do so.    -During this conversation, Lovey Newcomer noted that [name]'s O2 came back up to 99% and was remaining there for several minutes. Advised to continue to monitor, ensure probe is placed correctly and that fingers are warm when doing the reading.  Advised that although we cannot force [name] to seek emergency care, it is our recommendation.  She is aware that she is free to schedule an appointment at our clinic this afternoon, but again patient refused this.  Hassan Rowan, PT to document patient's refusal, and I will pass this information on to Dr. Sanda Klein, PCP.

## 2016-11-14 NOTE — Telephone Encounter (Signed)
Called patient states she is feeling good, denies any SOB  Or chest pain

## 2016-11-14 NOTE — Telephone Encounter (Signed)
Please check on patient today, see how she's feeling If needed, please schedule acute visit today or send to urgent care or ER

## 2016-11-15 ENCOUNTER — Telehealth: Payer: Self-pay | Admitting: Family Medicine

## 2016-11-15 DIAGNOSIS — G629 Polyneuropathy, unspecified: Secondary | ICD-10-CM | POA: Diagnosis not present

## 2016-11-15 DIAGNOSIS — I129 Hypertensive chronic kidney disease with stage 1 through stage 4 chronic kidney disease, or unspecified chronic kidney disease: Secondary | ICD-10-CM | POA: Diagnosis not present

## 2016-11-15 DIAGNOSIS — N183 Chronic kidney disease, stage 3 (moderate): Secondary | ICD-10-CM | POA: Diagnosis not present

## 2016-11-15 DIAGNOSIS — I2729 Other secondary pulmonary hypertension: Secondary | ICD-10-CM | POA: Diagnosis not present

## 2016-11-15 DIAGNOSIS — G4733 Obstructive sleep apnea (adult) (pediatric): Secondary | ICD-10-CM | POA: Diagnosis not present

## 2016-11-15 DIAGNOSIS — C8293 Follicular lymphoma, unspecified, intra-abdominal lymph nodes: Secondary | ICD-10-CM | POA: Diagnosis not present

## 2016-11-15 DIAGNOSIS — J449 Chronic obstructive pulmonary disease, unspecified: Secondary | ICD-10-CM | POA: Diagnosis not present

## 2016-11-15 DIAGNOSIS — M1990 Unspecified osteoarthritis, unspecified site: Secondary | ICD-10-CM | POA: Diagnosis not present

## 2016-11-15 DIAGNOSIS — M5416 Radiculopathy, lumbar region: Secondary | ICD-10-CM | POA: Diagnosis not present

## 2016-11-15 DIAGNOSIS — I739 Peripheral vascular disease, unspecified: Secondary | ICD-10-CM | POA: Diagnosis not present

## 2016-11-15 DIAGNOSIS — G35 Multiple sclerosis: Secondary | ICD-10-CM | POA: Diagnosis not present

## 2016-11-15 NOTE — Telephone Encounter (Signed)
Aldrin from [facility] requesting verbal order to continue home health physical therapy  2x weekly for 5 weeks also 1 week 1 visit for skilled nursing visit [phone]

## 2016-11-15 NOTE — Telephone Encounter (Signed)
Attempted to reach patient unable to leave voicmail

## 2016-11-18 NOTE — Telephone Encounter (Signed)
That's fine

## 2016-11-18 NOTE — Telephone Encounter (Signed)
Aldrin has been notified

## 2016-11-19 DIAGNOSIS — M5416 Radiculopathy, lumbar region: Secondary | ICD-10-CM | POA: Diagnosis not present

## 2016-11-19 DIAGNOSIS — G43909 Migraine, unspecified, not intractable, without status migrainosus: Secondary | ICD-10-CM | POA: Diagnosis not present

## 2016-11-19 DIAGNOSIS — I739 Peripheral vascular disease, unspecified: Secondary | ICD-10-CM | POA: Diagnosis not present

## 2016-11-19 DIAGNOSIS — G35 Multiple sclerosis: Secondary | ICD-10-CM | POA: Diagnosis not present

## 2016-11-19 DIAGNOSIS — M1990 Unspecified osteoarthritis, unspecified site: Secondary | ICD-10-CM | POA: Diagnosis not present

## 2016-11-19 DIAGNOSIS — I2729 Other secondary pulmonary hypertension: Secondary | ICD-10-CM | POA: Diagnosis not present

## 2016-11-19 DIAGNOSIS — G629 Polyneuropathy, unspecified: Secondary | ICD-10-CM | POA: Diagnosis not present

## 2016-11-19 DIAGNOSIS — J449 Chronic obstructive pulmonary disease, unspecified: Secondary | ICD-10-CM | POA: Diagnosis not present

## 2016-11-19 DIAGNOSIS — I10 Essential (primary) hypertension: Secondary | ICD-10-CM | POA: Diagnosis not present

## 2016-11-19 DIAGNOSIS — G4733 Obstructive sleep apnea (adult) (pediatric): Secondary | ICD-10-CM | POA: Diagnosis not present

## 2016-11-19 DIAGNOSIS — E559 Vitamin D deficiency, unspecified: Secondary | ICD-10-CM | POA: Diagnosis not present

## 2016-11-20 ENCOUNTER — Other Ambulatory Visit: Payer: Self-pay | Admitting: Obstetrics & Gynecology

## 2016-11-20 ENCOUNTER — Encounter: Payer: Self-pay | Admitting: Radiation Oncology

## 2016-11-20 ENCOUNTER — Ambulatory Visit
Admission: RE | Admit: 2016-11-20 | Discharge: 2016-11-20 | Disposition: A | Discharge status: Home / Self Care | Payer: Medicare Other | Source: Ambulatory Visit | Attending: Radiation Oncology | Admitting: Radiation Oncology

## 2016-11-20 VITALS — BP 132/84 | HR 73 | Temp 96.6°F | Resp 18

## 2016-11-20 DIAGNOSIS — E669 Obesity, unspecified: Secondary | ICD-10-CM | POA: Diagnosis not present

## 2016-11-20 DIAGNOSIS — G629 Polyneuropathy, unspecified: Secondary | ICD-10-CM | POA: Diagnosis not present

## 2016-11-20 DIAGNOSIS — C8293 Follicular lymphoma, unspecified, intra-abdominal lymph nodes: Secondary | ICD-10-CM

## 2016-11-20 DIAGNOSIS — I739 Peripheral vascular disease, unspecified: Secondary | ICD-10-CM | POA: Diagnosis not present

## 2016-11-20 DIAGNOSIS — K5900 Constipation, unspecified: Secondary | ICD-10-CM | POA: Diagnosis not present

## 2016-11-20 DIAGNOSIS — Z510 Encounter for antineoplastic radiation therapy: Secondary | ICD-10-CM | POA: Insufficient documentation

## 2016-11-20 DIAGNOSIS — G35 Multiple sclerosis: Secondary | ICD-10-CM | POA: Insufficient documentation

## 2016-11-20 DIAGNOSIS — F329 Major depressive disorder, single episode, unspecified: Secondary | ICD-10-CM | POA: Insufficient documentation

## 2016-11-20 DIAGNOSIS — R531 Weakness: Secondary | ICD-10-CM | POA: Insufficient documentation

## 2016-11-20 DIAGNOSIS — I129 Hypertensive chronic kidney disease with stage 1 through stage 4 chronic kidney disease, or unspecified chronic kidney disease: Secondary | ICD-10-CM | POA: Diagnosis not present

## 2016-11-20 DIAGNOSIS — J449 Chronic obstructive pulmonary disease, unspecified: Secondary | ICD-10-CM | POA: Insufficient documentation

## 2016-11-20 DIAGNOSIS — Z87891 Personal history of nicotine dependence: Secondary | ICD-10-CM | POA: Insufficient documentation

## 2016-11-20 DIAGNOSIS — M1990 Unspecified osteoarthritis, unspecified site: Secondary | ICD-10-CM | POA: Diagnosis not present

## 2016-11-20 DIAGNOSIS — I2729 Other secondary pulmonary hypertension: Secondary | ICD-10-CM | POA: Diagnosis not present

## 2016-11-20 DIAGNOSIS — C8213 Follicular lymphoma grade II, intra-abdominal lymph nodes: Secondary | ICD-10-CM | POA: Insufficient documentation

## 2016-11-20 DIAGNOSIS — G4733 Obstructive sleep apnea (adult) (pediatric): Secondary | ICD-10-CM | POA: Diagnosis not present

## 2016-11-20 DIAGNOSIS — N183 Chronic kidney disease, stage 3 (moderate): Secondary | ICD-10-CM | POA: Diagnosis not present

## 2016-11-20 DIAGNOSIS — M5416 Radiculopathy, lumbar region: Secondary | ICD-10-CM | POA: Diagnosis not present

## 2016-11-20 NOTE — Consult Note (Signed)
NEW PATIENT EVALUATION  Name: [name]  MRN: [id number]  Date:   11/20/2016     DOB: [birth year]   This 66 y.o. female patient presents to the clinic for initial evaluation of stage II follicular B-cell lymphoma.  REFERRING PHYSICIAN: Arnetha Courser, MD  CHIEF COMPLAINT:  Chief Complaint  Patient presents with  . Cancer    Pt is here for initial consultation of lymphoma    DIAGNOSIS: The encounter diagnosis was Follicular lymphoma of intra-abdominal lymph nodes, unspecified follicular lymphoma type ([place]).   PREVIOUS INVESTIGATIONS:  PET CT scans are reviewed Pathology reports reviewed Clinical notes reviewed Case was entered a tumor conference  HPI: Patient is a 66 year old female with multiple medical comorbidities including major depressive disease hypertension COPD morbid obesity multiple sclerosis. She regionally presented with left groin pain and left leg swelling with Doppler showing no evidence of DVT. She had a 6.2 cm left inguinal node. CT scan was performed on February 16 showing bulky lymphadenopathy within the left inguinal region left iliac chain highly suspicious for malignancy. She underwent excisional biopsy of left inguinal node with pathology showing follicular B-cell lymphoma grade 1-2 predominantly follicular. PET CT confirmed hypermetabolic activity in the lower retroperitoneal lymph nodes pelvic and inguinal region compatible with[place] 5 malignancy. Bone marrow was unremarkable. She was started on weekly doses of Rituxan in April 2018 did not follow-up developed Hurst be zoster in the right abdominal wall and then cellulitis the right lower extremity. She is now completed her fifth dose of Rituxan and case was presented at our weekly tumor conference where recognition was made for involved field radiation therapy. She's currently completing oral doxycycline for possible cellulitis in her left lower extremity. She otherwise generally feels weak she tends towards  constipation.  PLANNED TREATMENT REGIMEN: Involved field radiation  PAST MEDICAL HISTORY:  has a past medical history of Abdominal aortic atherosclerosis ([place]) (11/11/2016); ADHD; Anxiety; COPD (chronic obstructive pulmonary disease) ([place]); Depression; Dyspnea; Edema; Follicular lymphoma ([place]); Hypertension; Hypotension; Kyphoscoliosis and scoliosis (11/26/2011); Morbid obesity ([place]) (01/05/2016); Multiple sclerosis ([place]); Multiple sclerosis ([place]); Neuromuscular disorder ([place]); Obstructive and reflux uropathy; Pain; Peripheral vascular disease of lower extremity with ulceration ([place]) (11/08/2015); Skin ulcer ([place]) (11/08/2015); and Weakness.    PAST SURGICAL HISTORY:  Past Surgical History:  Procedure Laterality Date  . BACK SURGERY N/A 2002  . CYST EXCISION     lower back  . INGUINAL LYMPH NODE BIOPSY Left 07/04/2016   Procedure: INGUINAL LYMPH NODE BIOPSY;  Surgeon: Christene Lye, MD;  Location: [facility] ORS;  Service: General;  Laterality: Left;  . PORTACATH PLACEMENT N/A 07/22/2016   Procedure: INSERTION PORT-A-CATH;  Surgeon: Christene Lye, MD;  Location: [facility] ORS;  Service: General;  Laterality: N/A;  . TONSILLECTOMY AND ADENOIDECTOMY    . TUBAL LIGATION      FAMILY HISTORY: family history includes Alcohol abuse in her father; Arthritis in her brother, sister, and sister; CAD in her maternal grandmother; COPD in her mother; Diabetes in her mother; Heart failure in her mother; Kidney disease in her father; Kidney failure in her father; Mental illness in her sister; Stroke in her maternal grandfather.  SOCIAL HISTORY:  reports that she quit smoking about 9 months ago. Her smoking use included Cigarettes. She has a 20.00 pack-year smoking history. She has never used smokeless tobacco. She reports that she uses drugs, including Marijuana. She reports that she does not drink alcohol.  ALLERGIES: Patient has no known allergies.  MEDICATIONS:  Current Outpatient Prescriptions  Medication Sig Dispense Refill  . AVONEX PEN 30 MCG/0.5ML AJKT Inject 30 mcg into the skin every Monday.    Marland Kitchen buPROPion (WELLBUTRIN XL) 300 MG 24 hr tablet Take 300 mg by mouth every morning.     . clonazePAM (KLONOPIN) 1 MG tablet Take 1 tablet (1 mg total) by mouth at bedtime. 30 tablet 0  . doxycycline (VIBRA-TABS) 100 MG tablet Take 1 tablet (100 mg total) by mouth 2 (two) times daily. 20 tablet 0  . DULoxetine (CYMBALTA) 60 MG capsule Take 1 capsule (60 mg total) by mouth every morning. 30 capsule 3  . fluticasone furoate-vilanterol (BREO ELLIPTA) 100-25 MCG/INH AEPB Inhale 1 puff into the lungs daily. 1 each 11  . gabapentin (NEURONTIN) 600 MG tablet TAKE 1 TABLET (600 MG TOTAL) BY MOUTH 3 (THREE) TIMES DAILY. 90 tablet 2  . hydrochlorothiazide (MICROZIDE) 12.5 MG capsule TAKE ONE CAPSULE (12.5 MG TOTAL) BY MOUTH EVERY DAY (THIS REPLACES TRIAMTERENE/HCTZ) 30 capsule 5  . lidocaine-prilocaine (EMLA) cream Apply 1 application topically as needed. apply cream over the port site and then place small piece of saran wrap over the cream to protect your clothing 30 g 1  . lisinopril (PRINIVIL,ZESTRIL) 5 MG tablet Take 1 tablet (5 mg total) by mouth daily. 30 tablet 1  . methylphenidate (RITALIN) 10 MG tablet Take 1 tablet (10 mg total) by mouth 2 (two) times daily. 30 tablet 0  . Multiple Vitamin (MULTIVITAMIN WITH MINERALS) TABS tablet Take 1 tablet by mouth daily.    . Multiple Vitamins-Minerals (MULTIVITAMINS THER. W/MINERALS) TABS tablet Take 1 tablet by mouth daily.    . polyethylene glycol powder (GLYCOLAX/MIRALAX) powder Take 17 g by mouth daily. Mixed in water 850 g 2  . potassium chloride (K-DUR) 10 MEQ tablet Take 1 tablet (10 mEq total) by mouth daily. 90 tablet 1  . QUEtiapine Fumarate (SEROQUEL XR) 150 MG 24 hr tablet 150 mg at bedtime.   4  . senna-docusate (SENOKOT-S) 8.6-50 MG tablet Take 1 tablet by mouth daily.    . solifenacin (VESICARE) 5 MG tablet Take 5 mg by mouth daily.     Marland Kitchen tiZANidine (ZANAFLEX) 4 MG tablet Take 1 tablet (4 mg total) by mouth 4 (four) times daily. TAKES FOR MS 30 tablet 0  . topiramate (TOPAMAX) 50 MG tablet Take 1 tablet (50 mg total) by mouth daily. 30 tablet 0  . vitamin C (ASCORBIC ACID) 500 MG tablet Take 500 mg by mouth daily.     No current facility-administered medications for this encounter.    Facility-Administered Medications Ordered in Other Encounters  Medication Dose Route Frequency Provider Last Rate Last Dose  . heparin lock flush 100 unit/mL  500 Units Intracatheter Once PRN Sindy Guadeloupe, MD        ECOG PERFORMANCE STATUS:  2 - Symptomatic, <50% confined to bed  REVIEW OF SYSTEMS:  Patient denies any weight loss, fatigue, weakness, fever, chills or night sweats. Patient denies any loss of vision, blurred vision. Patient denies any ringing  of the ears or hearing loss. No irregular heartbeat. Patient denies heart murmur or history of fainting. Patient denies any chest pain or pain radiating to her upper extremities. Patient denies any shortness of breath, difficulty breathing at night, cough or hemoptysis. Patient denies any swelling in the lower legs. Patient denies any nausea vomiting, vomiting of blood, or coffee ground material in the vomitus. Patient denies any stomach pain. Patient states has had normal bowel movements no significant constipation or  diarrhea. Patient denies any dysuria, hematuria or significant nocturia. Patient denies any problems walking, swelling in the joints or loss of balance. Patient denies any skin changes, loss of hair or loss of weight. Patient denies any excessive worrying or anxiety or significant depression. Patient denies any problems with insomnia. Patient denies excessive thirst, polyuria, polydipsia. Patient denies any swollen glands, patient denies easy bruising or easy bleeding. Patient denies any recent infections, allergies or URI. Patient "s visual fields have not changed significantly in  recent time.    PHYSICAL EXAM: BP 132/84   Pulse 73   Temp (!) 96.6 F (35.9 C)   Resp 18  Wheelchair-bound morbidly obese female in NAD. No detectable adenopathy in her left inguinal region. Her left lower extremities is somewhat tense and erythematous no obvious sign of cellulitis at this time. Well-developed well-nourished patient in NAD. HEENT reveals PERLA, EOMI, discs not visualized.  Oral cavity is clear. No oral mucosal lesions are identified. Neck is clear without evidence of cervical or supraclavicular adenopathy. Lungs are clear to A&P. Cardiac examination is essentially unremarkable with regular rate and rhythm without murmur rub or thrill. Abdomen is benign with no organomegaly or masses noted. Motor sensory and DTR levels are equal and symmetric in the upper and lower extremities. Cranial nerves II through XII are grossly intact. Proprioception is intact. No peripheral adenopathy or edema is identified. No motor or sensory levels are noted. Crude visual fields are within normal range.  LABORATORY DATA: Pathology reports reviewed    RADIOLOGY RESULTS: PET CT scan CT scans reviewed   IMPRESSION: Stage II follicular B-cell lymphoma status post 5 cycles of Rituxan in a delayed course in 66 year old female with multiple medical comorbidities.  PLAN: At this time based on her initial bulky presentation would recommend involved field radiation therapy to her pelvis including areas of original disease involvement. Would plan on delivering 3000 cGy in 3 and half weeks. Risks and benefits of treatment including possible diarrhea possible increased lower urinary tract symptoms fatigue skin reaction alteration of blood counts all were discussed in detail with the patient. I have her set up and ordered CT simulation. Patient and friend seem to compress my treatment plan well.  I would like to take this opportunity to thank you for allowing me to participate in the care of your  patient.Armstead Peaks., MD

## 2016-11-21 ENCOUNTER — Telehealth: Payer: Self-pay | Admitting: Family Medicine

## 2016-11-21 DIAGNOSIS — G4733 Obstructive sleep apnea (adult) (pediatric): Secondary | ICD-10-CM | POA: Diagnosis not present

## 2016-11-21 DIAGNOSIS — N183 Chronic kidney disease, stage 3 (moderate): Secondary | ICD-10-CM | POA: Diagnosis not present

## 2016-11-21 DIAGNOSIS — I2729 Other secondary pulmonary hypertension: Secondary | ICD-10-CM | POA: Diagnosis not present

## 2016-11-21 DIAGNOSIS — I129 Hypertensive chronic kidney disease with stage 1 through stage 4 chronic kidney disease, or unspecified chronic kidney disease: Secondary | ICD-10-CM | POA: Diagnosis not present

## 2016-11-21 DIAGNOSIS — J449 Chronic obstructive pulmonary disease, unspecified: Secondary | ICD-10-CM | POA: Diagnosis not present

## 2016-11-21 DIAGNOSIS — G35 Multiple sclerosis: Secondary | ICD-10-CM | POA: Diagnosis not present

## 2016-11-21 DIAGNOSIS — C8293 Follicular lymphoma, unspecified, intra-abdominal lymph nodes: Secondary | ICD-10-CM | POA: Diagnosis not present

## 2016-11-21 DIAGNOSIS — M1990 Unspecified osteoarthritis, unspecified site: Secondary | ICD-10-CM | POA: Diagnosis not present

## 2016-11-21 DIAGNOSIS — M5416 Radiculopathy, lumbar region: Secondary | ICD-10-CM | POA: Diagnosis not present

## 2016-11-21 DIAGNOSIS — G629 Polyneuropathy, unspecified: Secondary | ICD-10-CM | POA: Diagnosis not present

## 2016-11-21 DIAGNOSIS — I739 Peripheral vascular disease, unspecified: Secondary | ICD-10-CM | POA: Diagnosis not present

## 2016-11-21 NOTE — Telephone Encounter (Signed)
Left detailed voicemail

## 2016-11-21 NOTE — Telephone Encounter (Signed)
Yes, that's fine 

## 2016-11-21 NOTE — Telephone Encounter (Signed)
[name] from [facility] requesting order. Needing 2x weekly for 4 weeks for occupational therapy [phone]

## 2016-11-22 DIAGNOSIS — N183 Chronic kidney disease, stage 3 (moderate): Secondary | ICD-10-CM | POA: Diagnosis not present

## 2016-11-22 DIAGNOSIS — M1990 Unspecified osteoarthritis, unspecified site: Secondary | ICD-10-CM | POA: Diagnosis not present

## 2016-11-22 DIAGNOSIS — G4733 Obstructive sleep apnea (adult) (pediatric): Secondary | ICD-10-CM | POA: Diagnosis not present

## 2016-11-22 DIAGNOSIS — G629 Polyneuropathy, unspecified: Secondary | ICD-10-CM | POA: Diagnosis not present

## 2016-11-22 DIAGNOSIS — I2729 Other secondary pulmonary hypertension: Secondary | ICD-10-CM | POA: Diagnosis not present

## 2016-11-22 DIAGNOSIS — G35 Multiple sclerosis: Secondary | ICD-10-CM | POA: Diagnosis not present

## 2016-11-22 DIAGNOSIS — C8293 Follicular lymphoma, unspecified, intra-abdominal lymph nodes: Secondary | ICD-10-CM | POA: Diagnosis not present

## 2016-11-22 DIAGNOSIS — I739 Peripheral vascular disease, unspecified: Secondary | ICD-10-CM | POA: Diagnosis not present

## 2016-11-22 DIAGNOSIS — J449 Chronic obstructive pulmonary disease, unspecified: Secondary | ICD-10-CM | POA: Diagnosis not present

## 2016-11-22 DIAGNOSIS — M5416 Radiculopathy, lumbar region: Secondary | ICD-10-CM | POA: Diagnosis not present

## 2016-11-22 DIAGNOSIS — I129 Hypertensive chronic kidney disease with stage 1 through stage 4 chronic kidney disease, or unspecified chronic kidney disease: Secondary | ICD-10-CM | POA: Diagnosis not present

## 2016-11-26 ENCOUNTER — Other Ambulatory Visit: Payer: Self-pay | Admitting: *Deleted

## 2016-11-26 DIAGNOSIS — G35 Multiple sclerosis: Secondary | ICD-10-CM | POA: Diagnosis not present

## 2016-11-26 DIAGNOSIS — I2729 Other secondary pulmonary hypertension: Secondary | ICD-10-CM | POA: Diagnosis not present

## 2016-11-26 DIAGNOSIS — G629 Polyneuropathy, unspecified: Secondary | ICD-10-CM | POA: Diagnosis not present

## 2016-11-26 DIAGNOSIS — M1990 Unspecified osteoarthritis, unspecified site: Secondary | ICD-10-CM | POA: Diagnosis not present

## 2016-11-26 DIAGNOSIS — I739 Peripheral vascular disease, unspecified: Secondary | ICD-10-CM | POA: Diagnosis not present

## 2016-11-26 DIAGNOSIS — J449 Chronic obstructive pulmonary disease, unspecified: Secondary | ICD-10-CM | POA: Diagnosis not present

## 2016-11-26 DIAGNOSIS — N183 Chronic kidney disease, stage 3 (moderate): Secondary | ICD-10-CM | POA: Diagnosis not present

## 2016-11-26 DIAGNOSIS — I129 Hypertensive chronic kidney disease with stage 1 through stage 4 chronic kidney disease, or unspecified chronic kidney disease: Secondary | ICD-10-CM | POA: Diagnosis not present

## 2016-11-26 DIAGNOSIS — G4733 Obstructive sleep apnea (adult) (pediatric): Secondary | ICD-10-CM | POA: Diagnosis not present

## 2016-11-26 DIAGNOSIS — M5416 Radiculopathy, lumbar region: Secondary | ICD-10-CM | POA: Diagnosis not present

## 2016-11-26 DIAGNOSIS — C8293 Follicular lymphoma, unspecified, intra-abdominal lymph nodes: Secondary | ICD-10-CM | POA: Diagnosis not present

## 2016-11-26 NOTE — Patient Outreach (Signed)
Unsuccessful telephone encounter to pt - part of ongoing transition of care, follow up on recent SNF ([facility]) discharge 10/29/16, admitted to rehab after recent hospitalization 10/11/16 to 10/14/16 for swelling, cellulitis of left lower extremity.   Unable to leave a voice message as message on phone- voice message not set up, please try call again later.     Plan:  RN CM to make another attempt to contact pt telephonically within the next 2 days.    Zara Chess.   [facility]  [phone]

## 2016-11-27 ENCOUNTER — Other Ambulatory Visit: Payer: Self-pay | Admitting: *Deleted

## 2016-11-27 NOTE — Patient Outreach (Signed)
Unsuccessful telephone encounter to pt (second attempt) as part of ongoing transition of care- recent SNF ([facility]) discharge 10/29/16, admitted to rehab after recent hospitalization 10/11/16 to 10/14/16 for swelling,cellulitis of left lower extremity.   Unable to leave a voice message as message on phone- voice message not set up, please try again later.     Plan:  RN CM to follow up again next week for final transition of care.    Zara Chess.   [facility]  [phone]

## 2016-11-28 DIAGNOSIS — N183 Chronic kidney disease, stage 3 (moderate): Secondary | ICD-10-CM | POA: Diagnosis not present

## 2016-11-28 DIAGNOSIS — G35 Multiple sclerosis: Secondary | ICD-10-CM | POA: Diagnosis not present

## 2016-11-28 DIAGNOSIS — I129 Hypertensive chronic kidney disease with stage 1 through stage 4 chronic kidney disease, or unspecified chronic kidney disease: Secondary | ICD-10-CM | POA: Diagnosis not present

## 2016-11-28 DIAGNOSIS — M5416 Radiculopathy, lumbar region: Secondary | ICD-10-CM | POA: Diagnosis not present

## 2016-11-28 DIAGNOSIS — J449 Chronic obstructive pulmonary disease, unspecified: Secondary | ICD-10-CM | POA: Diagnosis not present

## 2016-11-28 DIAGNOSIS — G629 Polyneuropathy, unspecified: Secondary | ICD-10-CM | POA: Diagnosis not present

## 2016-11-28 DIAGNOSIS — C8293 Follicular lymphoma, unspecified, intra-abdominal lymph nodes: Secondary | ICD-10-CM | POA: Diagnosis not present

## 2016-11-28 DIAGNOSIS — M1990 Unspecified osteoarthritis, unspecified site: Secondary | ICD-10-CM | POA: Diagnosis not present

## 2016-11-28 DIAGNOSIS — I2729 Other secondary pulmonary hypertension: Secondary | ICD-10-CM | POA: Diagnosis not present

## 2016-11-28 DIAGNOSIS — G4733 Obstructive sleep apnea (adult) (pediatric): Secondary | ICD-10-CM | POA: Diagnosis not present

## 2016-11-28 DIAGNOSIS — I739 Peripheral vascular disease, unspecified: Secondary | ICD-10-CM | POA: Diagnosis not present

## 2016-11-29 DIAGNOSIS — J449 Chronic obstructive pulmonary disease, unspecified: Secondary | ICD-10-CM | POA: Diagnosis not present

## 2016-11-29 DIAGNOSIS — M5416 Radiculopathy, lumbar region: Secondary | ICD-10-CM | POA: Diagnosis not present

## 2016-11-29 DIAGNOSIS — I739 Peripheral vascular disease, unspecified: Secondary | ICD-10-CM | POA: Diagnosis not present

## 2016-11-29 DIAGNOSIS — G35 Multiple sclerosis: Secondary | ICD-10-CM | POA: Diagnosis not present

## 2016-11-29 DIAGNOSIS — C8293 Follicular lymphoma, unspecified, intra-abdominal lymph nodes: Secondary | ICD-10-CM | POA: Diagnosis not present

## 2016-11-29 DIAGNOSIS — G4733 Obstructive sleep apnea (adult) (pediatric): Secondary | ICD-10-CM | POA: Diagnosis not present

## 2016-11-29 DIAGNOSIS — G629 Polyneuropathy, unspecified: Secondary | ICD-10-CM | POA: Diagnosis not present

## 2016-11-29 DIAGNOSIS — M1990 Unspecified osteoarthritis, unspecified site: Secondary | ICD-10-CM | POA: Diagnosis not present

## 2016-11-29 DIAGNOSIS — I129 Hypertensive chronic kidney disease with stage 1 through stage 4 chronic kidney disease, or unspecified chronic kidney disease: Secondary | ICD-10-CM | POA: Diagnosis not present

## 2016-11-29 DIAGNOSIS — I2729 Other secondary pulmonary hypertension: Secondary | ICD-10-CM | POA: Diagnosis not present

## 2016-11-29 DIAGNOSIS — N183 Chronic kidney disease, stage 3 (moderate): Secondary | ICD-10-CM | POA: Diagnosis not present

## 2016-12-02 ENCOUNTER — Other Ambulatory Visit: Payer: Self-pay | Admitting: *Deleted

## 2016-12-02 DIAGNOSIS — C8293 Follicular lymphoma, unspecified, intra-abdominal lymph nodes: Secondary | ICD-10-CM | POA: Diagnosis not present

## 2016-12-02 DIAGNOSIS — G629 Polyneuropathy, unspecified: Secondary | ICD-10-CM | POA: Diagnosis not present

## 2016-12-02 DIAGNOSIS — I739 Peripheral vascular disease, unspecified: Secondary | ICD-10-CM | POA: Diagnosis not present

## 2016-12-02 DIAGNOSIS — G4733 Obstructive sleep apnea (adult) (pediatric): Secondary | ICD-10-CM | POA: Diagnosis not present

## 2016-12-02 DIAGNOSIS — M5416 Radiculopathy, lumbar region: Secondary | ICD-10-CM | POA: Diagnosis not present

## 2016-12-02 DIAGNOSIS — M1990 Unspecified osteoarthritis, unspecified site: Secondary | ICD-10-CM | POA: Diagnosis not present

## 2016-12-02 DIAGNOSIS — N183 Chronic kidney disease, stage 3 (moderate): Secondary | ICD-10-CM | POA: Diagnosis not present

## 2016-12-02 DIAGNOSIS — G35 Multiple sclerosis: Secondary | ICD-10-CM | POA: Diagnosis not present

## 2016-12-02 DIAGNOSIS — I129 Hypertensive chronic kidney disease with stage 1 through stage 4 chronic kidney disease, or unspecified chronic kidney disease: Secondary | ICD-10-CM | POA: Diagnosis not present

## 2016-12-02 DIAGNOSIS — J449 Chronic obstructive pulmonary disease, unspecified: Secondary | ICD-10-CM | POA: Diagnosis not present

## 2016-12-02 DIAGNOSIS — I2729 Other secondary pulmonary hypertension: Secondary | ICD-10-CM | POA: Diagnosis not present

## 2016-12-02 NOTE — Patient Outreach (Signed)
Successful telephone encounter to [name], 66 year old female- final transition of care call/ongoing follow up on recent SNF ([facility]) discharge on 10/29/16, admitted to rehab after June 15-18, 2018 hospitalization for swelling, cellulitis of left lower extremity.   Spoke with pt, HIPAA identifiers provided.   Pt reports no swelling, HH OT,PT still coming, HH RN came last week/needs to come again to change catheter- would have changed at last visit but did not have right size catheter.   Pt reports did pick up Lisinopril, taking all her medications as ordered, does not check her BP.   RN CM discussed with pt  view in [place] PT called to report oxygen dropping to which pt reports back to normal, no problems.    RN CM discussed with pt today final transition of care call, plan to follow up again next month telephonically- check on clinical status.    Plan:  As discussed with pt, plan to follow up again next month telephonically.            Transition of care program ended, care plan updated.   Zara Chess.   [facility]  [phone]

## 2016-12-03 ENCOUNTER — Ambulatory Visit: Payer: Medicare Other

## 2016-12-04 ENCOUNTER — Ambulatory Visit
Admission: RE | Admit: 2016-12-04 | Discharge: 2016-12-04 | Disposition: A | Discharge status: Home / Self Care | Payer: Medicare Other | Source: Ambulatory Visit | Attending: Radiation Oncology | Admitting: Radiation Oncology

## 2016-12-04 DIAGNOSIS — I739 Peripheral vascular disease, unspecified: Secondary | ICD-10-CM | POA: Diagnosis not present

## 2016-12-04 DIAGNOSIS — K5900 Constipation, unspecified: Secondary | ICD-10-CM | POA: Diagnosis not present

## 2016-12-04 DIAGNOSIS — Z87891 Personal history of nicotine dependence: Secondary | ICD-10-CM | POA: Diagnosis not present

## 2016-12-04 DIAGNOSIS — R531 Weakness: Secondary | ICD-10-CM | POA: Diagnosis not present

## 2016-12-04 DIAGNOSIS — G35 Multiple sclerosis: Secondary | ICD-10-CM | POA: Diagnosis not present

## 2016-12-04 DIAGNOSIS — M5416 Radiculopathy, lumbar region: Secondary | ICD-10-CM | POA: Diagnosis not present

## 2016-12-04 DIAGNOSIS — C8213 Follicular lymphoma grade II, intra-abdominal lymph nodes: Secondary | ICD-10-CM | POA: Diagnosis not present

## 2016-12-04 DIAGNOSIS — I2729 Other secondary pulmonary hypertension: Secondary | ICD-10-CM | POA: Diagnosis not present

## 2016-12-04 DIAGNOSIS — C8293 Follicular lymphoma, unspecified, intra-abdominal lymph nodes: Secondary | ICD-10-CM | POA: Diagnosis not present

## 2016-12-04 DIAGNOSIS — M1990 Unspecified osteoarthritis, unspecified site: Secondary | ICD-10-CM | POA: Diagnosis not present

## 2016-12-04 DIAGNOSIS — G4733 Obstructive sleep apnea (adult) (pediatric): Secondary | ICD-10-CM | POA: Diagnosis not present

## 2016-12-04 DIAGNOSIS — J449 Chronic obstructive pulmonary disease, unspecified: Secondary | ICD-10-CM | POA: Diagnosis not present

## 2016-12-04 DIAGNOSIS — I129 Hypertensive chronic kidney disease with stage 1 through stage 4 chronic kidney disease, or unspecified chronic kidney disease: Secondary | ICD-10-CM | POA: Diagnosis not present

## 2016-12-04 DIAGNOSIS — Z510 Encounter for antineoplastic radiation therapy: Secondary | ICD-10-CM | POA: Diagnosis not present

## 2016-12-04 DIAGNOSIS — G629 Polyneuropathy, unspecified: Secondary | ICD-10-CM | POA: Diagnosis not present

## 2016-12-04 DIAGNOSIS — N183 Chronic kidney disease, stage 3 (moderate): Secondary | ICD-10-CM | POA: Diagnosis not present

## 2016-12-05 ENCOUNTER — Other Ambulatory Visit: Payer: Self-pay | Admitting: *Deleted

## 2016-12-05 ENCOUNTER — Telehealth: Payer: Self-pay | Admitting: Family Medicine

## 2016-12-05 ENCOUNTER — Encounter: Payer: Self-pay | Admitting: *Deleted

## 2016-12-05 DIAGNOSIS — M5416 Radiculopathy, lumbar region: Secondary | ICD-10-CM | POA: Diagnosis not present

## 2016-12-05 DIAGNOSIS — I129 Hypertensive chronic kidney disease with stage 1 through stage 4 chronic kidney disease, or unspecified chronic kidney disease: Secondary | ICD-10-CM | POA: Diagnosis not present

## 2016-12-05 DIAGNOSIS — G4733 Obstructive sleep apnea (adult) (pediatric): Secondary | ICD-10-CM | POA: Diagnosis not present

## 2016-12-05 DIAGNOSIS — I2729 Other secondary pulmonary hypertension: Secondary | ICD-10-CM | POA: Diagnosis not present

## 2016-12-05 DIAGNOSIS — N183 Chronic kidney disease, stage 3 (moderate): Secondary | ICD-10-CM | POA: Diagnosis not present

## 2016-12-05 DIAGNOSIS — M1990 Unspecified osteoarthritis, unspecified site: Secondary | ICD-10-CM | POA: Diagnosis not present

## 2016-12-05 DIAGNOSIS — C8293 Follicular lymphoma, unspecified, intra-abdominal lymph nodes: Secondary | ICD-10-CM | POA: Diagnosis not present

## 2016-12-05 DIAGNOSIS — J449 Chronic obstructive pulmonary disease, unspecified: Secondary | ICD-10-CM | POA: Diagnosis not present

## 2016-12-05 DIAGNOSIS — I739 Peripheral vascular disease, unspecified: Secondary | ICD-10-CM | POA: Diagnosis not present

## 2016-12-05 DIAGNOSIS — G35 Multiple sclerosis: Secondary | ICD-10-CM | POA: Diagnosis not present

## 2016-12-05 DIAGNOSIS — G629 Polyneuropathy, unspecified: Secondary | ICD-10-CM | POA: Diagnosis not present

## 2016-12-05 NOTE — Telephone Encounter (Signed)
[name] from [facility] requesting verbal order for compression wrap for her left lower leg. No titting edema. [phone]

## 2016-12-05 NOTE — Patient Outreach (Addendum)
[facility]) Care Management  [facility] Social Work  12/05/2016  [name] February 18, 1951 [id number]  Subjective:  Patient is a 66 year old female. Per patient, she is still receiving home health services through [place], [place] and OT. Patient also receiving personal care services through [facility]. Per patient, they come 2-3 times a week. Patient's spouse, however does the laundry, cooking and house cleaning.  Patient's friend [name] has not been reliable lately and she has to rely on her spouse for assistance. She and her husband continue to re-model the home to make it accessible to her. Patient states that she slept through her OBGYN appointment this morning and will have to re-schedule. However EPIC notes state patient's appointment is on 12/06/16 at 9:50am  Patient to start radiation on her abdomen on 12/11/16, will be daily for 2 weeks.  Spouse will provide the transportation.  Objective:   Encounter Medications:  Outpatient Encounter Prescriptions as of 12/05/2016  Medication Sig Note  . AVONEX PEN 30 MCG/0.5ML AJKT Inject 30 mcg into the skin every Monday.   Marland Kitchen buPROPion (WELLBUTRIN XL) 300 MG 24 hr tablet Take 300 mg by mouth every morning.  11/12/2016: Per pt to start taking. Some given to her at [facility] discharge.   . clonazePAM (KLONOPIN) 1 MG tablet Take 1 tablet (1 mg total) by mouth at bedtime.   Marland Kitchen doxycycline (VIBRA-TABS) 100 MG tablet Take 1 tablet (100 mg total) by mouth 2 (two) times daily.   . DULoxetine (CYMBALTA) 60 MG capsule Take 1 capsule (60 mg total) by mouth every morning.   . fluticasone furoate-vilanterol (BREO ELLIPTA) 100-25 MCG/INH AEPB Inhale 1 puff into the lungs daily. 11/12/2016: Pt uses as needed.   . gabapentin (NEURONTIN) 600 MG tablet TAKE 1 TABLET (600 MG TOTAL) BY MOUTH 3 (THREE) TIMES DAILY.   . hydrochlorothiazide (MICROZIDE) 12.5 MG capsule TAKE ONE CAPSULE (12.5 MG TOTAL) BY MOUTH EVERY DAY (THIS REPLACES TRIAMTERENE/HCTZ)   .  lidocaine-prilocaine (EMLA) cream Apply 1 application topically as needed. apply cream over the port site and then place small piece of saran wrap over the cream to protect your clothing 11/12/2016: As needed.   Marland Kitchen lisinopril (PRINIVIL,ZESTRIL) 5 MG tablet Take 1 tablet (5 mg total) by mouth daily.   . methylphenidate (RITALIN) 10 MG tablet Take 1 tablet (10 mg total) by mouth 2 (two) times daily.   . Multiple Vitamin (MULTIVITAMIN WITH MINERALS) TABS tablet Take 1 tablet by mouth daily.   . Multiple Vitamins-Minerals (MULTIVITAMINS THER. W/MINERALS) TABS tablet Take 1 tablet by mouth daily.   . polyethylene glycol powder (GLYCOLAX/MIRALAX) powder Take 17 g by mouth daily. Mixed in water 11/12/2016: As needed.   . potassium chloride (K-DUR) 10 MEQ tablet Take 1 tablet (10 mEq total) by mouth daily. 07/02/2016: 0900  . QUEtiapine Fumarate (SEROQUEL XR) 150 MG 24 hr tablet 150 mg at bedtime.    . senna-docusate (SENOKOT-S) 8.6-50 MG tablet Take 1 tablet by mouth daily.   . solifenacin (VESICARE) 5 MG tablet Take 5 mg by mouth daily. 11/12/2016: Pt taking 10 mg tablet daily   . tiZANidine (ZANAFLEX) 4 MG tablet Take 1 tablet (4 mg total) by mouth 4 (four) times daily. TAKES FOR MS   . topiramate (TOPAMAX) 50 MG tablet Take 1 tablet (50 mg total) by mouth daily. 11/12/2016: Per pt to start taking once a day   . vitamin C (ASCORBIC ACID) 500 MG tablet Take 500 mg by mouth daily.  Facility-Administered Encounter Medications as of 12/05/2016  Medication  . heparin lock flush 100 unit/mL    Functional Status:  In your present state of health, do you have any difficulty performing the following activities: 11/12/2016 11/11/2016  Hearing? N N  Vision? Y Y  Difficulty concentrating or making decisions? N N  Walking or climbing stairs? Y Y  Dressing or bathing? Y Y  Doing errands, shopping? Tempie Donning  Preparing Food and eating ? Y -  Using the Toilet? N -  In the past six months, have you accidently leaked urine?  N -  Comment pt has foley catheter- 10 years result of MS  -  Do you have problems with loss of bowel control? Y -  Managing your Medications? N -  Managing your Finances? Y -  Housekeeping or managing your Housekeeping? Y -  Some recent data might be hidden    Fall/Depression Screening:  PHQ 2/9 Scores 11/11/2016 10/09/2016 08/02/2016 06/18/2016 06/07/2016 12/22/2015 11/08/2015  PHQ - 2 Score 4 0 1 0 1 6 4   PHQ- 9 Score - - - - - 15 16    Assessment:  [facility] program discussed. Patient reminded that the program is temporary. Long term plan for in home assistance discussed. Per patient, her friend [name] is no longer as reliable as she previously was and now will rely solely  on her spouse for in home assistance. Per patient, she suspects that she may have taken some of her pain pills in the past. Patient does not want to press legal charges. Patient continues to follow up with her Psychiatrist, Dr. Collie Siad. Per patient, she saw him last week. Per patient, she has weaned herself off pain pills since her discharge from rehab and states that she is not in as nearly as much pain as she had been in the past. Patient reports feeling stronger and states that she feels useful in her home, helping around the house as best as she can. Patient verbalized having no additional social work/community resource needs.  Plan:  Patient to be closed to [facility] social work at this time.

## 2016-12-05 NOTE — Telephone Encounter (Signed)
That's fine thank you

## 2016-12-06 ENCOUNTER — Ambulatory Visit (INDEPENDENT_AMBULATORY_CARE_PROVIDER_SITE_OTHER): Payer: Medicare Other | Admitting: Obstetrics and Gynecology

## 2016-12-06 ENCOUNTER — Other Ambulatory Visit: Payer: Self-pay | Admitting: *Deleted

## 2016-12-06 DIAGNOSIS — C8293 Follicular lymphoma, unspecified, intra-abdominal lymph nodes: Secondary | ICD-10-CM

## 2016-12-06 DIAGNOSIS — N83202 Unspecified ovarian cyst, left side: Secondary | ICD-10-CM | POA: Diagnosis not present

## 2016-12-06 DIAGNOSIS — R590 Localized enlarged lymph nodes: Secondary | ICD-10-CM | POA: Diagnosis not present

## 2016-12-06 NOTE — Progress Notes (Addendum)
Obstetrics & Gynecology Office Visit   Chief Complaint  Patient presents with  . referral appointment  Referral from Rita Ohara, MD, MPH, from [facility] at [facility]  History of Present Illness: 66 y.o. female who is seen today in referral from Dr. Rita Ohara, MD, MPD, from [facility] at [facility], with a personal history of Stage II B-Cell follicular lymphoma and multiple medical comorbidities.  She is status post chemotherapy with rituxan and now has involved-field radiation therapy planned.  Her initial diagnosis was made based on an enlarged left inguinal lymph node measure up to 6.2 cm.   She a negative initial biopsy of the lymph node. In March this year she underwent excisional biopsy by Dr. Jamal Collin, the pathology of which reportedly showed follicular lymphoma, grade 1-2, predominantly follicular.  She had a CT Scan on 06/14/16 which was pertinently notable for a left adnexal cyst measuring 5.9 x 5.7 x 3.9 cm.   Another CT of the abdomen and pelvis was repeated on 11/05/16, which showed a 4.9 x 4.3 x 6.1 cm left adnexal cyst, and was described as being newly "hypermetabolic."  The patient does not report any symptoms related to her cyst.  She is referred for management recommendations. The patient is unsure of her last pap smear.     Past Medical History:  Diagnosis Date  . Abdominal aortic atherosclerosis ([place]) 11/11/2016  . ADHD   . Anxiety   . COPD (chronic obstructive pulmonary disease) ([place])   . Depression    major depressive  . Dyspnea    doe  . Edema    left leg  . Follicular lymphoma ([place])    B Cell  . Hypertension   . Hypotension    idiopathic  . Kyphoscoliosis and scoliosis 11/26/2011  . Morbid obesity ([place]) 01/05/2016  . Multiple sclerosis ([place])   . Multiple sclerosis ([place])    1980's  . Neuromuscular disorder ([place])   . Obstructive and reflux uropathy    foley  . Pain    atypical facial  . Peripheral vascular disease of lower  extremity with ulceration ([place]) 11/08/2015  . Skin ulcer ([place]) 11/08/2015  . Weakness    generalized. has MS    Past Surgical History:  Procedure Laterality Date  . BACK SURGERY N/A 2002  . CYST EXCISION     lower back  . INGUINAL LYMPH NODE BIOPSY Left 07/04/2016   Procedure: INGUINAL LYMPH NODE BIOPSY;  Surgeon: Christene Lye, MD;  Location: [facility] ORS;  Service: General;  Laterality: Left;  . PORTACATH PLACEMENT N/A 07/22/2016   Procedure: INSERTION PORT-A-CATH;  Surgeon: Christene Lye, MD;  Location: [facility] ORS;  Service: General;  Laterality: N/A;  . TONSILLECTOMY AND ADENOIDECTOMY    . TUBAL LIGATION      Gynecologic History: No LMP recorded. Patient is postmenopausal.  Family History  Problem Relation Age of Onset  . COPD Mother   . Diabetes Mother   . Heart failure Mother   . Alcohol abuse Father   . Kidney disease Father   . Kidney failure Father   . Arthritis Sister   . CAD Maternal Grandmother   . Stroke Maternal Grandfather   . Arthritis Sister   . Mental illness Sister   . Arthritis Brother     Social History   Social History  . Marital status: Married    Spouse name: N/A  . Number of children: 3  . Years of education: N/A   Occupational  History  . disabled    Social History Main Topics  . Smoking status: Former Smoker    Packs/day: 1.00    Years: 20.00    Types: Cigarettes    Quit date: 02/03/2016  . Smokeless tobacco: Never Used  . Alcohol use No  . Drug use: Yes    Types: Marijuana     Comment: smokes THC daily per pt   . Sexual activity: No   Other Topics Concern  . Not on file   Social History Narrative  . No narrative on file   Allergies: No Known Allergies  Prior to Admission medications   Medication Sig Start Date End Date Taking? Authorizing Provider  AVONEX PEN 30 MCG/0.5ML AJKT Inject 30 mcg into the skin every Monday. 04/28/16   [provider]  buPROPion (WELLBUTRIN XL) 300 MG 24 hr tablet Take 300 mg by  mouth every morning.  05/28/16   [provider]  clonazePAM (KLONOPIN) 1 MG tablet Take 1 tablet (1 mg total) by mouth at bedtime. 10/14/16   Vaughan Basta, MD  doxycycline (VIBRA-TABS) 100 MG tablet Take 1 tablet (100 mg total) by mouth 2 (two) times daily. 11/07/16   Sindy Guadeloupe, MD  DULoxetine (CYMBALTA) 60 MG capsule Take 1 capsule (60 mg total) by mouth every morning. 10/14/16   Vaughan Basta, MD  fluticasone furoate-vilanterol (BREO ELLIPTA) 100-25 MCG/INH AEPB Inhale 1 puff into the lungs daily. 01/05/16   Lada, Satira Anis, MD  gabapentin (NEURONTIN) 600 MG tablet TAKE 1 TABLET (600 MG TOTAL) BY MOUTH 3 (THREE) TIMES DAILY. 08/07/16   Lada, Satira Anis, MD  hydrochlorothiazide (MICROZIDE) 12.5 MG capsule TAKE ONE CAPSULE (12.5 MG TOTAL) BY MOUTH EVERY DAY (THIS REPLACES TRIAMTERENE/HCTZ) 08/07/16   Lada, Satira Anis, MD  lidocaine-prilocaine (EMLA) cream Apply 1 application topically as needed. apply cream over the port site and then place small piece of saran wrap over the cream to protect your clothing 11/07/16   Sindy Guadeloupe, MD  lisinopril (PRINIVIL,ZESTRIL) 5 MG tablet Take 1 tablet (5 mg total) by mouth daily. 11/13/16   Arnetha Courser, MD  methylphenidate (RITALIN) 10 MG tablet Take 1 tablet (10 mg total) by mouth 2 (two) times daily. 10/14/16   Vaughan Basta, MD  Multiple Vitamin (MULTIVITAMIN WITH MINERALS) TABS tablet Take 1 tablet by mouth daily.    [provider]  Multiple Vitamins-Minerals (MULTIVITAMINS THER. W/MINERALS) TABS tablet Take 1 tablet by mouth daily.    [provider]  polyethylene glycol powder (GLYCOLAX/MIRALAX) powder Take 17 g by mouth daily. Mixed in water 03/11/16   Lada, Satira Anis, MD  potassium chloride (K-DUR) 10 MEQ tablet Take 1 tablet (10 mEq total) by mouth daily. 12/22/15   Arnetha Courser, MD  QUEtiapine Fumarate (SEROQUEL XR) 150 MG 24 hr tablet 150 mg at bedtime.  08/14/16   [provider]    senna-docusate (SENOKOT-S) 8.6-50 MG tablet Take 1 tablet by mouth daily.    [provider]  solifenacin (VESICARE) 5 MG tablet Take 5 mg by mouth daily.    [provider]  tiZANidine (ZANAFLEX) 4 MG tablet Take 1 tablet (4 mg total) by mouth 4 (four) times daily. TAKES FOR MS 10/14/16   Vaughan Basta, MD  topiramate (TOPAMAX) 50 MG tablet Take 1 tablet (50 mg total) by mouth daily. 10/14/16   Vaughan Basta, MD  vitamin C (ASCORBIC ACID) 500 MG tablet Take 500 mg by mouth daily.    [provider]  Review of Systems  Constitutional: Positive for malaise/fatigue. Negative for chills, diaphoresis and fever.  HENT: Negative.   Eyes: Negative.   Respiratory: Positive for shortness of breath (chronic) and wheezing. Negative for sputum production.   Cardiovascular: Positive for leg swelling (predomoninantly left, chronic). Negative for chest pain.  Gastrointestinal: Negative for abdominal pain, blood in stool, constipation, diarrhea, heartburn, melena, nausea and vomiting.  Genitourinary: Negative.        Patient has long-term indwelling catheter in place  Musculoskeletal: Negative.   Skin: Positive for rash (left leg). Negative for itching.  Neurological: Positive for focal weakness and weakness. Negative for dizziness, tremors, sensory change, speech change, seizures and headaches.  Endo/Heme/Allergies: Negative.   Psychiatric/Behavioral: Negative.     Physical Exam There were no vitals taken for this visit. No LMP recorded. Patient is postmenopausal. Physical Exam  Constitutional: She is oriented to person, place, and time. She appears well-developed and well-nourished. No distress.  The patient is sitting on the stool portion of her walker  Genitourinary:  Genitourinary Comments: Foley catheter present, noted on visual inspection of patient. Pelvic exam deferred today  HENT:  Head: Normocephalic and atraumatic.  Eyes: Conjunctivae are  normal. No scleral icterus.  Neck: Normal range of motion. Neck supple.  Cardiovascular: Normal rate and regular rhythm.   Pulmonary/Chest: Effort normal and breath sounds normal.  Abdominal: Soft. She exhibits no distension and no mass. There is no tenderness.  Exam limited by body habitus  Musculoskeletal: Normal range of motion. She exhibits edema (L>R).  Neurological: She is alert and oriented to person, place, and time. No cranial nerve deficit.  Psychiatric: She has a normal mood and affect. Her behavior is normal. Judgment normal.   Assessment: 66 y.o. postmenopausal female with Stage II follicular B-Cell lymphoma referred for left ovarian cyst assessment and management.   Plan: Problem List Items Addressed This Visit    Inguinal adenopathy   Follicular lymphoma of intra-abdominal lymph nodes (HCC)   Left ovarian cyst - Primary   Relevant Orders   Ambulatory referral to Gynecologic Oncology   US Transvaginal Non-OB     Given the overall complexity of this patient with multiple medical comorbidities who is actively undergoing treatment for her lymphoma, I believe a referral to gynecologic oncology is most pertinent to make recommendations for this patient.  She is likely a poor surgical candidate and, therefore, it would be difficult to make a pathology-based diagnosis of her left ovarian cystic process.  It appears to be stable over the past 5 months at least.  I have ordered a pelvic ultrasound to better assess the cyst and have this available for her gynecologic oncology appointment.  I discussed this with the patient who  Agrees with my plan.  I spent 30 minutes with this patient with greater than 50% spent in discussion and mangement of her current left adnexal cyst. I have reviewed outside records from other providers and have reviewed the pertinent CT scan images (05/2016 and 10/2016).    Prentice Docker, MD 12/09/2016 3:23 AM     CC: Sindy Guadeloupe, MD [place], [place] [id number]  Enid Derry, MD

## 2016-12-09 ENCOUNTER — Encounter: Payer: Self-pay | Admitting: Obstetrics and Gynecology

## 2016-12-09 ENCOUNTER — Other Ambulatory Visit: Payer: Self-pay | Admitting: Obstetrics & Gynecology

## 2016-12-09 DIAGNOSIS — Z510 Encounter for antineoplastic radiation therapy: Secondary | ICD-10-CM | POA: Diagnosis not present

## 2016-12-09 DIAGNOSIS — R1900 Intra-abdominal and pelvic swelling, mass and lump, unspecified site: Secondary | ICD-10-CM

## 2016-12-09 DIAGNOSIS — Z87891 Personal history of nicotine dependence: Secondary | ICD-10-CM | POA: Diagnosis not present

## 2016-12-09 DIAGNOSIS — K5900 Constipation, unspecified: Secondary | ICD-10-CM | POA: Diagnosis not present

## 2016-12-09 DIAGNOSIS — N83202 Unspecified ovarian cyst, left side: Secondary | ICD-10-CM | POA: Insufficient documentation

## 2016-12-09 DIAGNOSIS — J449 Chronic obstructive pulmonary disease, unspecified: Secondary | ICD-10-CM | POA: Diagnosis not present

## 2016-12-09 DIAGNOSIS — R531 Weakness: Secondary | ICD-10-CM | POA: Diagnosis not present

## 2016-12-09 DIAGNOSIS — C8213 Follicular lymphoma grade II, intra-abdominal lymph nodes: Secondary | ICD-10-CM | POA: Diagnosis not present

## 2016-12-09 DIAGNOSIS — G35 Multiple sclerosis: Secondary | ICD-10-CM | POA: Diagnosis not present

## 2016-12-10 ENCOUNTER — Ambulatory Visit: Admission: RE | Admit: 2016-12-10 | Payer: Medicare Other | Source: Ambulatory Visit

## 2016-12-10 ENCOUNTER — Telehealth: Payer: Self-pay | Admitting: Family Medicine

## 2016-12-10 ENCOUNTER — Ambulatory Visit: Payer: Medicare Other

## 2016-12-10 ENCOUNTER — Ambulatory Visit
Admission: RE | Admit: 2016-12-10 | Discharge: 2016-12-10 | Disposition: A | Discharge status: Home / Self Care | Payer: Medicare Other | Source: Ambulatory Visit | Attending: Obstetrics and Gynecology | Admitting: Obstetrics and Gynecology

## 2016-12-10 DIAGNOSIS — R1900 Intra-abdominal and pelvic swelling, mass and lump, unspecified site: Secondary | ICD-10-CM | POA: Diagnosis not present

## 2016-12-10 DIAGNOSIS — N83202 Unspecified ovarian cyst, left side: Secondary | ICD-10-CM | POA: Insufficient documentation

## 2016-12-10 NOTE — Telephone Encounter (Signed)
Pt states she has a flare up of Cellulitis and is requesting a call back.

## 2016-12-11 ENCOUNTER — Telehealth: Payer: Self-pay | Admitting: Family Medicine

## 2016-12-11 ENCOUNTER — Ambulatory Visit: Payer: Medicare Other

## 2016-12-11 ENCOUNTER — Ambulatory Visit
Admission: RE | Admit: 2016-12-11 | Discharge: 2016-12-11 | Disposition: A | Discharge status: Home / Self Care | Payer: Medicare Other | Source: Ambulatory Visit | Attending: Radiation Oncology | Admitting: Radiation Oncology

## 2016-12-11 ENCOUNTER — Inpatient Hospital Stay: Payer: Medicare Other | Attending: Obstetrics and Gynecology | Admitting: Obstetrics and Gynecology

## 2016-12-11 VITALS — BP 123/68 | HR 66 | Temp 97.3°F | Resp 18 | Ht 64.0 in | Wt 298.3 lb

## 2016-12-11 DIAGNOSIS — F419 Anxiety disorder, unspecified: Secondary | ICD-10-CM | POA: Insufficient documentation

## 2016-12-11 DIAGNOSIS — K5900 Constipation, unspecified: Secondary | ICD-10-CM | POA: Diagnosis not present

## 2016-12-11 DIAGNOSIS — R339 Retention of urine, unspecified: Secondary | ICD-10-CM | POA: Diagnosis not present

## 2016-12-11 DIAGNOSIS — F909 Attention-deficit hyperactivity disorder, unspecified type: Secondary | ICD-10-CM | POA: Insufficient documentation

## 2016-12-11 DIAGNOSIS — F3341 Major depressive disorder, recurrent, in partial remission: Secondary | ICD-10-CM | POA: Insufficient documentation

## 2016-12-11 DIAGNOSIS — G35 Multiple sclerosis: Secondary | ICD-10-CM | POA: Diagnosis not present

## 2016-12-11 DIAGNOSIS — I129 Hypertensive chronic kidney disease with stage 1 through stage 4 chronic kidney disease, or unspecified chronic kidney disease: Secondary | ICD-10-CM | POA: Insufficient documentation

## 2016-12-11 DIAGNOSIS — N839 Noninflammatory disorder of ovary, fallopian tube and broad ligament, unspecified: Secondary | ICD-10-CM

## 2016-12-11 DIAGNOSIS — Z9181 History of falling: Secondary | ICD-10-CM | POA: Diagnosis not present

## 2016-12-11 DIAGNOSIS — I739 Peripheral vascular disease, unspecified: Secondary | ICD-10-CM | POA: Diagnosis not present

## 2016-12-11 DIAGNOSIS — G4730 Sleep apnea, unspecified: Secondary | ICD-10-CM | POA: Insufficient documentation

## 2016-12-11 DIAGNOSIS — N183 Chronic kidney disease, stage 3 (moderate): Secondary | ICD-10-CM | POA: Diagnosis not present

## 2016-12-11 DIAGNOSIS — M5416 Radiculopathy, lumbar region: Secondary | ICD-10-CM | POA: Insufficient documentation

## 2016-12-11 DIAGNOSIS — Z8619 Personal history of other infectious and parasitic diseases: Secondary | ICD-10-CM | POA: Diagnosis not present

## 2016-12-11 DIAGNOSIS — R531 Weakness: Secondary | ICD-10-CM | POA: Diagnosis not present

## 2016-12-11 DIAGNOSIS — N838 Other noninflammatory disorders of ovary, fallopian tube and broad ligament: Secondary | ICD-10-CM

## 2016-12-11 DIAGNOSIS — M419 Scoliosis, unspecified: Secondary | ICD-10-CM | POA: Insufficient documentation

## 2016-12-11 DIAGNOSIS — J449 Chronic obstructive pulmonary disease, unspecified: Secondary | ICD-10-CM | POA: Diagnosis not present

## 2016-12-11 DIAGNOSIS — G629 Polyneuropathy, unspecified: Secondary | ICD-10-CM | POA: Diagnosis not present

## 2016-12-11 DIAGNOSIS — Z87891 Personal history of nicotine dependence: Secondary | ICD-10-CM | POA: Diagnosis not present

## 2016-12-11 DIAGNOSIS — E669 Obesity, unspecified: Secondary | ICD-10-CM | POA: Diagnosis not present

## 2016-12-11 DIAGNOSIS — I2729 Other secondary pulmonary hypertension: Secondary | ICD-10-CM | POA: Diagnosis not present

## 2016-12-11 DIAGNOSIS — C8293 Follicular lymphoma, unspecified, intra-abdominal lymph nodes: Secondary | ICD-10-CM | POA: Diagnosis not present

## 2016-12-11 DIAGNOSIS — F329 Major depressive disorder, single episode, unspecified: Secondary | ICD-10-CM | POA: Diagnosis not present

## 2016-12-11 DIAGNOSIS — G4733 Obstructive sleep apnea (adult) (pediatric): Secondary | ICD-10-CM | POA: Diagnosis not present

## 2016-12-11 DIAGNOSIS — Z79899 Other long term (current) drug therapy: Secondary | ICD-10-CM | POA: Diagnosis not present

## 2016-12-11 DIAGNOSIS — M1990 Unspecified osteoarthritis, unspecified site: Secondary | ICD-10-CM | POA: Diagnosis not present

## 2016-12-11 DIAGNOSIS — Z8572 Personal history of non-Hodgkin lymphomas: Secondary | ICD-10-CM | POA: Diagnosis not present

## 2016-12-11 DIAGNOSIS — Z510 Encounter for antineoplastic radiation therapy: Secondary | ICD-10-CM | POA: Diagnosis not present

## 2016-12-11 DIAGNOSIS — R590 Localized enlarged lymph nodes: Secondary | ICD-10-CM | POA: Diagnosis not present

## 2016-12-11 DIAGNOSIS — C8213 Follicular lymphoma grade II, intra-abdominal lymph nodes: Secondary | ICD-10-CM | POA: Diagnosis not present

## 2016-12-11 NOTE — Telephone Encounter (Signed)
I advised patient to be evaluated today at urgent care or the ER I do not know if she went I think she needs to have an evaluation for her leg; I don't know if she has cellulitis or a blood clot and I told the patient this; that's why I advised her to get in to see a doctor today If she hasn't had an evaluation, get her in to see me or Raquel Sarna on Thursday

## 2016-12-11 NOTE — Progress Notes (Signed)
Gynecologic Oncology Consult Visit   Referring Provider:  Dr. Prentice Docker   Chief Concern: left ovarian mass  Subjective:  [name] is a 66 y.o. P49 female who is seen in consultation from Dr. Sanda Klein for left ovarian mass.  She has severe Multiple Sclerosis.    Diagnosed with stage II B cell follicular lymphoma in 3/08 and status post chemotherapy with rituxan with CT showing decrease in lymphadenopathy.  Now has involved-field radiation therapy planned.  Her initial diagnosis was made based on an enlarged left inguinal lymph node measure up to 6.2 cm.   She a negative initial biopsy of the lymph node. In March this year she underwent excisional biopsy by Dr. Jamal Collin, the pathology of which reportedly showed follicular lymphoma, grade 1-2, predominantly follicular.  She had a CT Scan on 06/14/16 which was notable for a left adnexal cyst measuring 5.9 x 5.7 x 3.9 cm.   Another CT of the abdomen and pelvis was repeated on 11/05/16, which showed a 4.9 x 4.3 x 6.1 cm left adnexal cyst, and was described as being newly "hypermetabolic."  The patient does not report any symptoms related to her cyst.  She was seen by Dr Glennon Mac in benign Gyn and he referred for management recommendations.   CT scan 7/18  IMPRESSION: 1. Moderate reduction in size of the lower abdominal and pelvic adenopathy. 2. Overall stable volume of the cyst of the left adnexal lesion. There is probably some internal septation. This was not previously hypermetabolic. In light of the size of the lesion as well as the patient is age, pelvic sonography is recommended to assess potential for ovarian malignancy.  [place] 8/14 IMPRESSION: 1. 6.3 cm complex cystic left ovarian mass. This contains multiple thin internal septations. Surgical consultation is recommended. 2. 7.3 cm solid left adnexal/pelvic sidewall mass, corresponds to the CT demonstrated pelvic mass and history of lymphoma 3. Nonvisualization of the right ovary  Problem  List: Patient Active Problem List   Diagnosis Date Noted  . Left ovarian cyst 12/09/2016  . Abdominal aortic atherosclerosis ([place]) 11/11/2016  . Cellulitis of left lower extremity 10/11/2016  . Swelling of lower extremity 10/11/2016  . Obstructive sleep apnea 10/11/2016  . Bilateral lower leg cellulitis 10/11/2016  . Follicular lymphoma of intra-abdominal lymph nodes ([place]) 08/06/2016  . Multiple falls 06/07/2016  . Inguinal adenopathy 05/29/2016  . Breast cancer screening 01/05/2016  . Morbid obesity ([place]) 01/05/2016  . Pressure ulcer 12/19/2015  . Low HDL (under 40) 12/19/2015  . Cellulitis 12/18/2015  . Skin ulcer ([place]) 11/08/2015  . Peripheral vascular disease of lower extremity with ulceration ([place]) 11/08/2015  . CKD (chronic kidney disease) stage 3, GFR 30-59 ml/min 11/08/2015  . Decubitus ulcer of left thigh, stage 2 04/18/2015  . Constipation due to pain medication 01/31/2015  . Obstructive sleep apnea of adult 01/13/2015  . Pelvic muscle wasting 01/13/2015  . Incomplete bladder emptying 01/13/2015  . Headache, migraine 10/24/2014  . 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 Current tobacco use 10/24/2014  . Lumbar radiculopathy, chronic 10/02/2013  . COPD with bronchial hyperresponsiveness ([place]) 10/02/2013  . Major depressive disorder, recurrent, in partial remission ([place]) 10/02/2013  . Hypertension goal BP (blood pressure) < 140/90 10/02/2013  . Multiple sclerosis ([place]) 10/02/2013  . Absence of bladder continence 09/25/2012  . Acontractile bladder 02/12/2012  . Bladder retention 02/12/2012  . Narrowing of intervertebral disc space 11/26/2011  . Kyphoscoliosis and scoliosis 11/26/2011    Past Medical History: Past Medical History:  Diagnosis  Date  . Abdominal aortic atherosclerosis ([place]) 11/11/2016  . ADHD   . Anxiety   . COPD (chronic obstructive pulmonary disease) ([place])   . Depression    major depressive  . Dyspnea    doe  . Edema    left leg  . Follicular  lymphoma ([place])    B Cell  . Follicular lymphoma grade II ([place])   . Hypertension   . Hypotension    idiopathic  . Kyphoscoliosis and scoliosis 11/26/2011  . Morbid obesity ([place]) 01/05/2016  . Multiple sclerosis ([place])   . Multiple sclerosis ([place])    1980's  . Neuromuscular disorder ([place])   . Obstructive and reflux uropathy    foley  . Pain    atypical facial  . Peripheral vascular disease of lower extremity with ulceration ([place]) 11/08/2015  . Skin ulcer ([place]) 11/08/2015  . Weakness    generalized. has MS    Past Surgical History: Past Surgical History:  Procedure Laterality Date  . BACK SURGERY N/A 2002  . CYST EXCISION     lower back  . INGUINAL LYMPH NODE BIOPSY Left 07/04/2016   Procedure: INGUINAL LYMPH NODE BIOPSY;  Surgeon: Christene Lye, MD;  Location: [facility] ORS;  Service: General;  Laterality: Left;  . PORTACATH PLACEMENT N/A 07/22/2016   Procedure: INSERTION PORT-A-CATH;  Surgeon: Christene Lye, MD;  Location: [facility] ORS;  Service: General;  Laterality: N/A;  . TONSILLECTOMY AND ADENOIDECTOMY    . TUBAL LIGATION      OB History:  OB History  Gravida Para Term Preterm AB Living  3         3  SAB TAB Ectopic Multiple Live Births               # Outcome Date GA Lbr Len/2nd Weight Sex Delivery Anes PTL Lv  3 Gravida           2 Gravida           1 Gravida             Obstetric Comments  Menstrual age: 31    Age 1st Pregnancy: 5    Family History: Family History  Problem Relation Age of Onset  . COPD Mother   . Diabetes Mother   . Heart failure Mother   . Alcohol abuse Father   . Kidney disease Father   . Kidney failure Father   . Arthritis Sister   . CAD Maternal Grandmother   . Stroke Maternal Grandfather   . Arthritis Sister   . Mental illness Sister   . Arthritis Brother     Social History: Social History   Social History  . Marital status: Married    Spouse name: N/A  . Number of children: 3  . Years of education: N/A    Occupational History  . disabled    Social History Main Topics  . Smoking status: Former Smoker    Packs/day: 1.00    Years: 20.00    Types: Cigarettes    Quit date: 02/03/2016  . Smokeless tobacco: Never Used  . Alcohol use No  . Drug use: Yes    Types: Marijuana     Comment: smokes THC daily per pt   . Sexual activity: No   Other Topics Concern  . Not on file   Social History Narrative  . No narrative on file    Allergies: No Known Allergies  Current Medications: Current Outpatient Prescriptions  Medication Sig Dispense Refill  .  AVONEX PEN 30 MCG/0.5ML AJKT Inject 30 mcg into the skin every Monday.    Marland Kitchen buPROPion (WELLBUTRIN XL) 300 MG 24 hr tablet Take 300 mg by mouth every morning.     . clonazePAM (KLONOPIN) 1 MG tablet Take 1 tablet (1 mg total) by mouth at bedtime. 30 tablet 0  . DULoxetine (CYMBALTA) 60 MG capsule Take 1 capsule (60 mg total) by mouth every morning. 30 capsule 3  . fluticasone furoate-vilanterol (BREO ELLIPTA) 100-25 MCG/INH AEPB Inhale 1 puff into the lungs daily. 1 each 11  . gabapentin (NEURONTIN) 600 MG tablet TAKE 1 TABLET (600 MG TOTAL) BY MOUTH 3 (THREE) TIMES DAILY. 90 tablet 2  . hydrochlorothiazide (MICROZIDE) 12.5 MG capsule TAKE ONE CAPSULE (12.5 MG TOTAL) BY MOUTH EVERY DAY (THIS REPLACES TRIAMTERENE/HCTZ) 30 capsule 5  . lidocaine-prilocaine (EMLA) cream Apply 1 application topically as needed. apply cream over the port site and then place small piece of saran wrap over the cream to protect your clothing 30 g 1  . lisinopril (PRINIVIL,ZESTRIL) 5 MG tablet Take 1 tablet (5 mg total) by mouth daily. 30 tablet 1  . methylphenidate (RITALIN) 10 MG tablet Take 1 tablet (10 mg total) by mouth 2 (two) times daily. 30 tablet 0  . Multiple Vitamin (MULTIVITAMIN WITH MINERALS) TABS tablet Take 1 tablet by mouth daily.    . polyethylene glycol powder (GLYCOLAX/MIRALAX) powder Take 17 g by mouth daily. Mixed in water 850 g 2  . potassium  chloride (K-DUR) 10 MEQ tablet Take 1 tablet (10 mEq total) by mouth daily. 90 tablet 1  . predniSONE (DELTASONE) 1 MG tablet Take 1 mg by mouth daily with breakfast.    . QUEtiapine Fumarate (SEROQUEL XR) 150 MG 24 hr tablet 150 mg at bedtime.   4  . solifenacin (VESICARE) 5 MG tablet Take 5 mg by mouth daily.    Marland Kitchen tiZANidine (ZANAFLEX) 4 MG tablet Take 1 tablet (4 mg total) by mouth 4 (four) times daily. TAKES FOR MS 30 tablet 0  . topiramate (TOPAMAX) 50 MG tablet Take 1 tablet (50 mg total) by mouth daily. 30 tablet 0  . vitamin C (ASCORBIC ACID) 500 MG tablet Take 500 mg by mouth daily.    Marland Kitchen senna-docusate (SENOKOT-S) 8.6-50 MG tablet Take 1 tablet by mouth daily.     No current facility-administered medications for this visit.    Facility-Administered Medications Ordered in Other Visits  Medication Dose Route Frequency Provider Last Rate Last Dose  . heparin lock flush 100 unit/mL  500 Units Intracatheter Once PRN Sindy Guadeloupe, MD        Review of Systems Per HPI  Objective:  Physical Examination:  BP 123/68   Pulse 66   Temp (!) 97.3 F (36.3 C) (Tympanic)   Resp 18   Ht 5\' 4"  (1.626 m)   Wt 298 lb 4.8 oz (135.3 kg)   BMI 51.20 kg/m    ECOG Performance Status: 2 - Symptomatic, <50% confined to bed  General appearance: alert, cooperative and appears older than stated age HEENT:neck supple with midline trachea and thyroid without masses Lymph node survey: non-palpable, axillary, inguinal, supraclavicular Cardiovascular: regular rate and rhythm, no murmurs or gallops Respiratory: normal air entry, lungs clear to auscultation and no rales, rhonchi or wheezing Breast exam: not examined. Abdomen: obese, soft, non-tender, without masses or organomegaly, no hernias and well healed incision Back: inspection of back is normal Extremities: extremities normal, atraumatic, no cyanosis or edema Skin exam - normal  coloration and turgor, no rashes, no suspicious skin lesions  noted. Neurological exam reveals alert, oriented, normal speech, no focal findings or movement disorder noted.  Pelvic: exam chaperoned by nurse;  Vulva: normal appearing vulva with no masses, tenderness or lesions; Vagina: normal vagina; Adnexa: normal adnexa in size, nontender and no masses; Uterus: uterus is normal size, shape, consistency and nontender; Cervix: anteverted; Rectal: normal rectal, no masses  Lab Review CA125 today 32    Assessment:  [name] is a 66 y.o. female diagnosed with left ovarian mass during work up for follicular lymphoma.  It has been stable over the past 6 months and has benign characteristics and CA125 is normal.  She is a relatively poor surgical candidate due to morbid obesity, severe MS etc   Medical co-morbidities complicating care: morbid obesity.Lymphoma.  Plan:   Problem List Items Addressed This Visit    None    Visit Diagnoses    Ovarian mass, left    -  Primary      We discussed options for management including expectant management in view of benign characteristics on imaging and she is a poor surgical candidate.  Continued monitoring of the left ovarian cystic mass can be done on future planned follow up scans for her lymphoma. We can see her back if there are concerning changes with increased growth of the mass.   The patient's diagnosis, an outline of the further diagnostic and laboratory studies which will be required, the recommendation, and alternatives were discussed.  All questions were answered to the patient's satisfaction.  A total of 60 minutes were spent with the patient/family today; 30% was spent in education, counseling and coordination of care for ovarian cystic mass.    Mellody Drown, MD  CC:  Arnetha Courser, MD [address] [place], [place] [address] [phone]

## 2016-12-11 NOTE — Progress Notes (Signed)
  Oncology Nurse Navigator Documentation Chaperoned pelvic exam.  Navigator Location: [facility] (12/11/16 1600)   )Navigator Encounter Type: Initial GynOnc (12/11/16 1600)                     Patient Visit Type: GynOnc (12/11/16 1600)                              Time Spent with Patient: 30 (12/11/16 1600)

## 2016-12-11 NOTE — Telephone Encounter (Signed)
She says her leg is huge; left leg is huge; red later in the day, not as red in the morning; I just can't call something in; needs to be evaluated today; "I already have a full day" she says; go to the ER or urgent care I explained to have this checked out; ddx includes DVT or cellulitis, but they're treated completely differently

## 2016-12-11 NOTE — Progress Notes (Signed)
Pt here for left ovarian cyst and adnexal/pelvic sidewall mass.

## 2016-12-11 NOTE — Telephone Encounter (Signed)
[name] from [facility] states they have called several times pertaining to left leg cellulitis and stress the concerns about it. They are requesting orders to wrap the left leg.  Asking for return call [phone]

## 2016-12-11 NOTE — Telephone Encounter (Signed)
Sela Hua with [facility] called to inform Dr Sanda Klein that pt missed PT visit. [phone]

## 2016-12-12 ENCOUNTER — Inpatient Hospital Stay: Payer: Medicare Other

## 2016-12-12 ENCOUNTER — Ambulatory Visit
Admission: RE | Admit: 2016-12-12 | Discharge: 2016-12-12 | Disposition: A | Discharge status: Home / Self Care | Payer: Medicare Other | Source: Ambulatory Visit | Attending: Radiation Oncology | Admitting: Radiation Oncology

## 2016-12-12 DIAGNOSIS — M419 Scoliosis, unspecified: Secondary | ICD-10-CM | POA: Diagnosis not present

## 2016-12-12 DIAGNOSIS — M5416 Radiculopathy, lumbar region: Secondary | ICD-10-CM | POA: Diagnosis not present

## 2016-12-12 DIAGNOSIS — C8213 Follicular lymphoma grade II, intra-abdominal lymph nodes: Secondary | ICD-10-CM | POA: Diagnosis not present

## 2016-12-12 DIAGNOSIS — Z510 Encounter for antineoplastic radiation therapy: Secondary | ICD-10-CM | POA: Diagnosis not present

## 2016-12-12 DIAGNOSIS — R339 Retention of urine, unspecified: Secondary | ICD-10-CM | POA: Diagnosis not present

## 2016-12-12 DIAGNOSIS — I739 Peripheral vascular disease, unspecified: Secondary | ICD-10-CM | POA: Diagnosis not present

## 2016-12-12 DIAGNOSIS — C8293 Follicular lymphoma, unspecified, intra-abdominal lymph nodes: Secondary | ICD-10-CM

## 2016-12-12 DIAGNOSIS — G4730 Sleep apnea, unspecified: Secondary | ICD-10-CM | POA: Diagnosis not present

## 2016-12-12 DIAGNOSIS — R590 Localized enlarged lymph nodes: Secondary | ICD-10-CM | POA: Diagnosis not present

## 2016-12-12 DIAGNOSIS — Z9181 History of falling: Secondary | ICD-10-CM | POA: Diagnosis not present

## 2016-12-12 DIAGNOSIS — Z8619 Personal history of other infectious and parasitic diseases: Secondary | ICD-10-CM | POA: Diagnosis not present

## 2016-12-12 DIAGNOSIS — J449 Chronic obstructive pulmonary disease, unspecified: Secondary | ICD-10-CM | POA: Diagnosis not present

## 2016-12-12 DIAGNOSIS — R531 Weakness: Secondary | ICD-10-CM | POA: Diagnosis not present

## 2016-12-12 DIAGNOSIS — N183 Chronic kidney disease, stage 3 (moderate): Secondary | ICD-10-CM | POA: Diagnosis not present

## 2016-12-12 DIAGNOSIS — N838 Other noninflammatory disorders of ovary, fallopian tube and broad ligament: Secondary | ICD-10-CM

## 2016-12-12 DIAGNOSIS — I129 Hypertensive chronic kidney disease with stage 1 through stage 4 chronic kidney disease, or unspecified chronic kidney disease: Secondary | ICD-10-CM | POA: Diagnosis not present

## 2016-12-12 DIAGNOSIS — Z87891 Personal history of nicotine dependence: Secondary | ICD-10-CM | POA: Diagnosis not present

## 2016-12-12 DIAGNOSIS — Z79899 Other long term (current) drug therapy: Secondary | ICD-10-CM | POA: Diagnosis not present

## 2016-12-12 DIAGNOSIS — Z8572 Personal history of non-Hodgkin lymphomas: Secondary | ICD-10-CM | POA: Diagnosis not present

## 2016-12-12 DIAGNOSIS — K5900 Constipation, unspecified: Secondary | ICD-10-CM | POA: Diagnosis not present

## 2016-12-12 DIAGNOSIS — G35 Multiple sclerosis: Secondary | ICD-10-CM | POA: Diagnosis not present

## 2016-12-12 DIAGNOSIS — N839 Noninflammatory disorder of ovary, fallopian tube and broad ligament, unspecified: Secondary | ICD-10-CM | POA: Diagnosis not present

## 2016-12-12 LAB — CBC
HCT: 33.1 % — ABNORMAL LOW (ref 35.0–47.0)
Hemoglobin: 11.4 g/dL — ABNORMAL LOW (ref 12.0–16.0)
MCH: 28.6 pg (ref 26.0–34.0)
MCHC: 34.4 g/dL (ref 32.0–36.0)
MCV: 82.9 fL (ref 80.0–100.0)
Platelets: 302 10*3/uL (ref 150–440)
RBC: 4 MIL/uL (ref 3.80–5.20)
RDW: 14.9 % — ABNORMAL HIGH (ref 11.5–14.5)
WBC: 10.6 10*3/uL (ref 3.6–11.0)

## 2016-12-12 NOTE — Telephone Encounter (Signed)
I tried to contact the patient but she has a voice mail that is not set up. I then contacted Lovey Newcomer from [facility] and informed her of what was going on. She stated that the patient had told her the same thing, but did not want to go to urgent care due to the wait time and that we did not have anything available. I then told her that Dr Sanda Klein was booked, but we do have availability with another provider. She will try to contact the patient and the patient caregiver and I will also try to contact the patient again. Lovey Newcomer did ask if Dr Sanda Klein would just take their word about the cellulitis and I informed her no, that Dr Sanda Klein said patient must be seen (per the last message Dr Sanda Klein had written). She verbalized understanding.

## 2016-12-12 NOTE — Progress Notes (Signed)
For you, and Gyn Onc for sure!

## 2016-12-12 NOTE — Telephone Encounter (Signed)
I called [name] I never heard from the nurse The left leg has flared up again She has extreme edema, a little bit of redness, scaling and sloughing; nothing that she thinks blood clot; she did a full shower, she saw her for an hour and a half Nurse has seen here several times [name] does not think she has a DVT Wants orders for wrapping, blessing given I reviewed note from August 9th; blessing was given then but she says no one ever called them back about that No fevers at all; temp checked and no fevers Left lower extremity wrap, verbal order given Will hold on doctor evaluation for now since nursing is on the scene

## 2016-12-13 ENCOUNTER — Telehealth: Payer: Self-pay

## 2016-12-13 ENCOUNTER — Ambulatory Visit
Admission: RE | Admit: 2016-12-13 | Discharge: 2016-12-13 | Disposition: A | Discharge status: Home / Self Care | Payer: Medicare Other | Source: Ambulatory Visit | Attending: Radiation Oncology | Admitting: Radiation Oncology

## 2016-12-13 DIAGNOSIS — C8213 Follicular lymphoma grade II, intra-abdominal lymph nodes: Secondary | ICD-10-CM | POA: Diagnosis not present

## 2016-12-13 DIAGNOSIS — M5416 Radiculopathy, lumbar region: Secondary | ICD-10-CM | POA: Diagnosis not present

## 2016-12-13 DIAGNOSIS — G35 Multiple sclerosis: Secondary | ICD-10-CM | POA: Diagnosis not present

## 2016-12-13 DIAGNOSIS — Z510 Encounter for antineoplastic radiation therapy: Secondary | ICD-10-CM | POA: Diagnosis not present

## 2016-12-13 DIAGNOSIS — I2729 Other secondary pulmonary hypertension: Secondary | ICD-10-CM | POA: Diagnosis not present

## 2016-12-13 DIAGNOSIS — N183 Chronic kidney disease, stage 3 (moderate): Secondary | ICD-10-CM | POA: Diagnosis not present

## 2016-12-13 DIAGNOSIS — I129 Hypertensive chronic kidney disease with stage 1 through stage 4 chronic kidney disease, or unspecified chronic kidney disease: Secondary | ICD-10-CM | POA: Diagnosis not present

## 2016-12-13 DIAGNOSIS — C8293 Follicular lymphoma, unspecified, intra-abdominal lymph nodes: Secondary | ICD-10-CM | POA: Diagnosis not present

## 2016-12-13 DIAGNOSIS — I739 Peripheral vascular disease, unspecified: Secondary | ICD-10-CM | POA: Diagnosis not present

## 2016-12-13 DIAGNOSIS — G629 Polyneuropathy, unspecified: Secondary | ICD-10-CM | POA: Diagnosis not present

## 2016-12-13 DIAGNOSIS — K5900 Constipation, unspecified: Secondary | ICD-10-CM | POA: Diagnosis not present

## 2016-12-13 DIAGNOSIS — G4733 Obstructive sleep apnea (adult) (pediatric): Secondary | ICD-10-CM | POA: Diagnosis not present

## 2016-12-13 DIAGNOSIS — M1990 Unspecified osteoarthritis, unspecified site: Secondary | ICD-10-CM | POA: Diagnosis not present

## 2016-12-13 DIAGNOSIS — Z87891 Personal history of nicotine dependence: Secondary | ICD-10-CM | POA: Diagnosis not present

## 2016-12-13 DIAGNOSIS — R531 Weakness: Secondary | ICD-10-CM | POA: Diagnosis not present

## 2016-12-13 DIAGNOSIS — J449 Chronic obstructive pulmonary disease, unspecified: Secondary | ICD-10-CM | POA: Diagnosis not present

## 2016-12-13 LAB — CA 125: Cancer Antigen (CA) 125: 32.3 U/mL (ref 0.0–38.1)

## 2016-12-13 NOTE — Telephone Encounter (Signed)
They do they will try to get in contact with patient again to set up, they just have to notify you.

## 2016-12-13 NOTE — Telephone Encounter (Signed)
Please see if there is something they can do or we need to do to make that happen; thank you

## 2016-12-13 NOTE — Telephone Encounter (Signed)
[facility] called wanted to let you know she missed an appt to get her folly changed

## 2016-12-14 DIAGNOSIS — I2729 Other secondary pulmonary hypertension: Secondary | ICD-10-CM | POA: Diagnosis not present

## 2016-12-14 DIAGNOSIS — N183 Chronic kidney disease, stage 3 (moderate): Secondary | ICD-10-CM | POA: Diagnosis not present

## 2016-12-14 DIAGNOSIS — G4733 Obstructive sleep apnea (adult) (pediatric): Secondary | ICD-10-CM | POA: Diagnosis not present

## 2016-12-14 DIAGNOSIS — I129 Hypertensive chronic kidney disease with stage 1 through stage 4 chronic kidney disease, or unspecified chronic kidney disease: Secondary | ICD-10-CM | POA: Diagnosis not present

## 2016-12-14 DIAGNOSIS — G35 Multiple sclerosis: Secondary | ICD-10-CM | POA: Diagnosis not present

## 2016-12-14 DIAGNOSIS — G629 Polyneuropathy, unspecified: Secondary | ICD-10-CM | POA: Diagnosis not present

## 2016-12-14 DIAGNOSIS — C8293 Follicular lymphoma, unspecified, intra-abdominal lymph nodes: Secondary | ICD-10-CM | POA: Diagnosis not present

## 2016-12-14 DIAGNOSIS — J449 Chronic obstructive pulmonary disease, unspecified: Secondary | ICD-10-CM | POA: Diagnosis not present

## 2016-12-14 DIAGNOSIS — I739 Peripheral vascular disease, unspecified: Secondary | ICD-10-CM | POA: Diagnosis not present

## 2016-12-14 DIAGNOSIS — M1990 Unspecified osteoarthritis, unspecified site: Secondary | ICD-10-CM | POA: Diagnosis not present

## 2016-12-14 DIAGNOSIS — M5416 Radiculopathy, lumbar region: Secondary | ICD-10-CM | POA: Diagnosis not present

## 2016-12-16 ENCOUNTER — Ambulatory Visit
Admission: RE | Admit: 2016-12-16 | Discharge: 2016-12-16 | Disposition: A | Discharge status: Home / Self Care | Payer: Medicare Other | Source: Ambulatory Visit | Attending: Radiation Oncology | Admitting: Radiation Oncology

## 2016-12-16 DIAGNOSIS — J449 Chronic obstructive pulmonary disease, unspecified: Secondary | ICD-10-CM | POA: Diagnosis not present

## 2016-12-16 DIAGNOSIS — C8213 Follicular lymphoma grade II, intra-abdominal lymph nodes: Secondary | ICD-10-CM | POA: Diagnosis not present

## 2016-12-16 DIAGNOSIS — Z510 Encounter for antineoplastic radiation therapy: Secondary | ICD-10-CM | POA: Diagnosis not present

## 2016-12-16 DIAGNOSIS — Z87891 Personal history of nicotine dependence: Secondary | ICD-10-CM | POA: Diagnosis not present

## 2016-12-16 DIAGNOSIS — R531 Weakness: Secondary | ICD-10-CM | POA: Diagnosis not present

## 2016-12-16 DIAGNOSIS — K5900 Constipation, unspecified: Secondary | ICD-10-CM | POA: Diagnosis not present

## 2016-12-16 DIAGNOSIS — G35 Multiple sclerosis: Secondary | ICD-10-CM | POA: Diagnosis not present

## 2016-12-17 ENCOUNTER — Ambulatory Visit
Admission: RE | Admit: 2016-12-17 | Discharge: 2016-12-17 | Disposition: A | Discharge status: Home / Self Care | Payer: Medicare Other | Source: Ambulatory Visit | Attending: Radiation Oncology | Admitting: Radiation Oncology

## 2016-12-17 DIAGNOSIS — G35 Multiple sclerosis: Secondary | ICD-10-CM | POA: Diagnosis not present

## 2016-12-17 DIAGNOSIS — I129 Hypertensive chronic kidney disease with stage 1 through stage 4 chronic kidney disease, or unspecified chronic kidney disease: Secondary | ICD-10-CM | POA: Diagnosis not present

## 2016-12-17 DIAGNOSIS — Z510 Encounter for antineoplastic radiation therapy: Secondary | ICD-10-CM | POA: Diagnosis not present

## 2016-12-17 DIAGNOSIS — K5900 Constipation, unspecified: Secondary | ICD-10-CM | POA: Diagnosis not present

## 2016-12-17 DIAGNOSIS — M1990 Unspecified osteoarthritis, unspecified site: Secondary | ICD-10-CM | POA: Diagnosis not present

## 2016-12-17 DIAGNOSIS — N183 Chronic kidney disease, stage 3 (moderate): Secondary | ICD-10-CM | POA: Diagnosis not present

## 2016-12-17 DIAGNOSIS — J449 Chronic obstructive pulmonary disease, unspecified: Secondary | ICD-10-CM | POA: Diagnosis not present

## 2016-12-17 DIAGNOSIS — C8293 Follicular lymphoma, unspecified, intra-abdominal lymph nodes: Secondary | ICD-10-CM | POA: Diagnosis not present

## 2016-12-17 DIAGNOSIS — R531 Weakness: Secondary | ICD-10-CM | POA: Diagnosis not present

## 2016-12-17 DIAGNOSIS — G629 Polyneuropathy, unspecified: Secondary | ICD-10-CM | POA: Diagnosis not present

## 2016-12-17 DIAGNOSIS — M5416 Radiculopathy, lumbar region: Secondary | ICD-10-CM | POA: Diagnosis not present

## 2016-12-17 DIAGNOSIS — I2729 Other secondary pulmonary hypertension: Secondary | ICD-10-CM | POA: Diagnosis not present

## 2016-12-17 DIAGNOSIS — I739 Peripheral vascular disease, unspecified: Secondary | ICD-10-CM | POA: Diagnosis not present

## 2016-12-17 DIAGNOSIS — Z87891 Personal history of nicotine dependence: Secondary | ICD-10-CM | POA: Diagnosis not present

## 2016-12-17 DIAGNOSIS — G4733 Obstructive sleep apnea (adult) (pediatric): Secondary | ICD-10-CM | POA: Diagnosis not present

## 2016-12-17 DIAGNOSIS — C8213 Follicular lymphoma grade II, intra-abdominal lymph nodes: Secondary | ICD-10-CM | POA: Diagnosis not present

## 2016-12-18 ENCOUNTER — Ambulatory Visit
Admission: RE | Admit: 2016-12-18 | Discharge: 2016-12-18 | Disposition: A | Discharge status: Home / Self Care | Payer: Medicare Other | Source: Ambulatory Visit | Attending: Radiation Oncology | Admitting: Radiation Oncology

## 2016-12-18 DIAGNOSIS — G35 Multiple sclerosis: Secondary | ICD-10-CM | POA: Diagnosis not present

## 2016-12-18 DIAGNOSIS — N183 Chronic kidney disease, stage 3 (moderate): Secondary | ICD-10-CM | POA: Diagnosis not present

## 2016-12-18 DIAGNOSIS — J449 Chronic obstructive pulmonary disease, unspecified: Secondary | ICD-10-CM | POA: Diagnosis not present

## 2016-12-18 DIAGNOSIS — I129 Hypertensive chronic kidney disease with stage 1 through stage 4 chronic kidney disease, or unspecified chronic kidney disease: Secondary | ICD-10-CM | POA: Diagnosis not present

## 2016-12-18 DIAGNOSIS — K5900 Constipation, unspecified: Secondary | ICD-10-CM | POA: Diagnosis not present

## 2016-12-18 DIAGNOSIS — M1990 Unspecified osteoarthritis, unspecified site: Secondary | ICD-10-CM | POA: Diagnosis not present

## 2016-12-18 DIAGNOSIS — I2729 Other secondary pulmonary hypertension: Secondary | ICD-10-CM | POA: Diagnosis not present

## 2016-12-18 DIAGNOSIS — C8293 Follicular lymphoma, unspecified, intra-abdominal lymph nodes: Secondary | ICD-10-CM | POA: Diagnosis not present

## 2016-12-18 DIAGNOSIS — G4733 Obstructive sleep apnea (adult) (pediatric): Secondary | ICD-10-CM | POA: Diagnosis not present

## 2016-12-18 DIAGNOSIS — R531 Weakness: Secondary | ICD-10-CM | POA: Diagnosis not present

## 2016-12-18 DIAGNOSIS — C8213 Follicular lymphoma grade II, intra-abdominal lymph nodes: Secondary | ICD-10-CM | POA: Diagnosis not present

## 2016-12-18 DIAGNOSIS — Z510 Encounter for antineoplastic radiation therapy: Secondary | ICD-10-CM | POA: Diagnosis not present

## 2016-12-18 DIAGNOSIS — M5416 Radiculopathy, lumbar region: Secondary | ICD-10-CM | POA: Diagnosis not present

## 2016-12-18 DIAGNOSIS — Z87891 Personal history of nicotine dependence: Secondary | ICD-10-CM | POA: Diagnosis not present

## 2016-12-18 DIAGNOSIS — G629 Polyneuropathy, unspecified: Secondary | ICD-10-CM | POA: Diagnosis not present

## 2016-12-18 DIAGNOSIS — I739 Peripheral vascular disease, unspecified: Secondary | ICD-10-CM | POA: Diagnosis not present

## 2016-12-19 ENCOUNTER — Ambulatory Visit
Admission: RE | Admit: 2016-12-19 | Discharge: 2016-12-19 | Disposition: A | Discharge status: Home / Self Care | Payer: Medicare Other | Source: Ambulatory Visit | Attending: Radiation Oncology | Admitting: Radiation Oncology

## 2016-12-19 ENCOUNTER — Telehealth: Payer: Self-pay

## 2016-12-19 DIAGNOSIS — C8213 Follicular lymphoma grade II, intra-abdominal lymph nodes: Secondary | ICD-10-CM | POA: Diagnosis not present

## 2016-12-19 DIAGNOSIS — M5416 Radiculopathy, lumbar region: Secondary | ICD-10-CM | POA: Diagnosis not present

## 2016-12-19 DIAGNOSIS — G35 Multiple sclerosis: Secondary | ICD-10-CM | POA: Diagnosis not present

## 2016-12-19 DIAGNOSIS — M1990 Unspecified osteoarthritis, unspecified site: Secondary | ICD-10-CM | POA: Diagnosis not present

## 2016-12-19 DIAGNOSIS — G4733 Obstructive sleep apnea (adult) (pediatric): Secondary | ICD-10-CM | POA: Diagnosis not present

## 2016-12-19 DIAGNOSIS — I739 Peripheral vascular disease, unspecified: Secondary | ICD-10-CM | POA: Diagnosis not present

## 2016-12-19 DIAGNOSIS — R531 Weakness: Secondary | ICD-10-CM | POA: Diagnosis not present

## 2016-12-19 DIAGNOSIS — K5900 Constipation, unspecified: Secondary | ICD-10-CM | POA: Diagnosis not present

## 2016-12-19 DIAGNOSIS — N183 Chronic kidney disease, stage 3 (moderate): Secondary | ICD-10-CM | POA: Diagnosis not present

## 2016-12-19 DIAGNOSIS — Z87891 Personal history of nicotine dependence: Secondary | ICD-10-CM | POA: Diagnosis not present

## 2016-12-19 DIAGNOSIS — I2729 Other secondary pulmonary hypertension: Secondary | ICD-10-CM | POA: Diagnosis not present

## 2016-12-19 DIAGNOSIS — Z510 Encounter for antineoplastic radiation therapy: Secondary | ICD-10-CM | POA: Diagnosis not present

## 2016-12-19 DIAGNOSIS — J449 Chronic obstructive pulmonary disease, unspecified: Secondary | ICD-10-CM | POA: Diagnosis not present

## 2016-12-19 DIAGNOSIS — G629 Polyneuropathy, unspecified: Secondary | ICD-10-CM | POA: Diagnosis not present

## 2016-12-19 DIAGNOSIS — I129 Hypertensive chronic kidney disease with stage 1 through stage 4 chronic kidney disease, or unspecified chronic kidney disease: Secondary | ICD-10-CM | POA: Diagnosis not present

## 2016-12-19 DIAGNOSIS — C8293 Follicular lymphoma, unspecified, intra-abdominal lymph nodes: Secondary | ICD-10-CM | POA: Diagnosis not present

## 2016-12-19 NOTE — Telephone Encounter (Signed)
Verbal given on voicemail

## 2016-12-19 NOTE — Telephone Encounter (Signed)
[name] called from [facility] wants verbal for occupational therapy for 2 days a week for 4 weeks? [phone]

## 2016-12-19 NOTE — Telephone Encounter (Signed)
That's fine, thank you  

## 2016-12-20 ENCOUNTER — Telehealth: Payer: Self-pay | Admitting: Family Medicine

## 2016-12-20 ENCOUNTER — Ambulatory Visit
Admission: RE | Admit: 2016-12-20 | Discharge: 2016-12-20 | Disposition: A | Discharge status: Home / Self Care | Payer: Medicare Other | Source: Ambulatory Visit | Attending: Radiation Oncology | Admitting: Radiation Oncology

## 2016-12-20 DIAGNOSIS — M5416 Radiculopathy, lumbar region: Secondary | ICD-10-CM | POA: Diagnosis not present

## 2016-12-20 DIAGNOSIS — I129 Hypertensive chronic kidney disease with stage 1 through stage 4 chronic kidney disease, or unspecified chronic kidney disease: Secondary | ICD-10-CM | POA: Diagnosis not present

## 2016-12-20 DIAGNOSIS — G4733 Obstructive sleep apnea (adult) (pediatric): Secondary | ICD-10-CM | POA: Diagnosis not present

## 2016-12-20 DIAGNOSIS — Z510 Encounter for antineoplastic radiation therapy: Secondary | ICD-10-CM | POA: Diagnosis not present

## 2016-12-20 DIAGNOSIS — K5900 Constipation, unspecified: Secondary | ICD-10-CM | POA: Diagnosis not present

## 2016-12-20 DIAGNOSIS — I739 Peripheral vascular disease, unspecified: Secondary | ICD-10-CM | POA: Diagnosis not present

## 2016-12-20 DIAGNOSIS — J449 Chronic obstructive pulmonary disease, unspecified: Secondary | ICD-10-CM | POA: Diagnosis not present

## 2016-12-20 DIAGNOSIS — M1990 Unspecified osteoarthritis, unspecified site: Secondary | ICD-10-CM | POA: Diagnosis not present

## 2016-12-20 DIAGNOSIS — C8293 Follicular lymphoma, unspecified, intra-abdominal lymph nodes: Secondary | ICD-10-CM | POA: Diagnosis not present

## 2016-12-20 DIAGNOSIS — Z87891 Personal history of nicotine dependence: Secondary | ICD-10-CM | POA: Diagnosis not present

## 2016-12-20 DIAGNOSIS — R531 Weakness: Secondary | ICD-10-CM | POA: Diagnosis not present

## 2016-12-20 DIAGNOSIS — N183 Chronic kidney disease, stage 3 (moderate): Secondary | ICD-10-CM | POA: Diagnosis not present

## 2016-12-20 DIAGNOSIS — C8213 Follicular lymphoma grade II, intra-abdominal lymph nodes: Secondary | ICD-10-CM | POA: Diagnosis not present

## 2016-12-20 DIAGNOSIS — G35 Multiple sclerosis: Secondary | ICD-10-CM | POA: Diagnosis not present

## 2016-12-20 DIAGNOSIS — G629 Polyneuropathy, unspecified: Secondary | ICD-10-CM | POA: Diagnosis not present

## 2016-12-20 DIAGNOSIS — I2729 Other secondary pulmonary hypertension: Secondary | ICD-10-CM | POA: Diagnosis not present

## 2016-12-20 NOTE — Telephone Encounter (Signed)
Aldrin from [facility] requesting verbal to continue home health PT for  Twice a week for 4 weeks (P) [phone]

## 2016-12-20 NOTE — Telephone Encounter (Signed)
That's fine thank you

## 2016-12-21 DIAGNOSIS — G629 Polyneuropathy, unspecified: Secondary | ICD-10-CM | POA: Diagnosis not present

## 2016-12-21 DIAGNOSIS — G35 Multiple sclerosis: Secondary | ICD-10-CM | POA: Diagnosis not present

## 2016-12-21 DIAGNOSIS — J449 Chronic obstructive pulmonary disease, unspecified: Secondary | ICD-10-CM | POA: Diagnosis not present

## 2016-12-21 DIAGNOSIS — M1990 Unspecified osteoarthritis, unspecified site: Secondary | ICD-10-CM | POA: Diagnosis not present

## 2016-12-21 DIAGNOSIS — C8293 Follicular lymphoma, unspecified, intra-abdominal lymph nodes: Secondary | ICD-10-CM | POA: Diagnosis not present

## 2016-12-21 DIAGNOSIS — I2729 Other secondary pulmonary hypertension: Secondary | ICD-10-CM | POA: Diagnosis not present

## 2016-12-21 DIAGNOSIS — G4733 Obstructive sleep apnea (adult) (pediatric): Secondary | ICD-10-CM | POA: Diagnosis not present

## 2016-12-21 DIAGNOSIS — I739 Peripheral vascular disease, unspecified: Secondary | ICD-10-CM | POA: Diagnosis not present

## 2016-12-21 DIAGNOSIS — M5416 Radiculopathy, lumbar region: Secondary | ICD-10-CM | POA: Diagnosis not present

## 2016-12-21 DIAGNOSIS — I129 Hypertensive chronic kidney disease with stage 1 through stage 4 chronic kidney disease, or unspecified chronic kidney disease: Secondary | ICD-10-CM | POA: Diagnosis not present

## 2016-12-21 DIAGNOSIS — N183 Chronic kidney disease, stage 3 (moderate): Secondary | ICD-10-CM | POA: Diagnosis not present

## 2016-12-23 ENCOUNTER — Ambulatory Visit
Admission: RE | Admit: 2016-12-23 | Discharge: 2016-12-23 | Disposition: A | Discharge status: Home / Self Care | Payer: Medicare Other | Source: Ambulatory Visit | Attending: Radiation Oncology | Admitting: Radiation Oncology

## 2016-12-23 DIAGNOSIS — K5900 Constipation, unspecified: Secondary | ICD-10-CM | POA: Diagnosis not present

## 2016-12-23 DIAGNOSIS — G4733 Obstructive sleep apnea (adult) (pediatric): Secondary | ICD-10-CM | POA: Diagnosis not present

## 2016-12-23 DIAGNOSIS — N183 Chronic kidney disease, stage 3 (moderate): Secondary | ICD-10-CM | POA: Diagnosis not present

## 2016-12-23 DIAGNOSIS — I739 Peripheral vascular disease, unspecified: Secondary | ICD-10-CM | POA: Diagnosis not present

## 2016-12-23 DIAGNOSIS — C8293 Follicular lymphoma, unspecified, intra-abdominal lymph nodes: Secondary | ICD-10-CM | POA: Diagnosis not present

## 2016-12-23 DIAGNOSIS — M1990 Unspecified osteoarthritis, unspecified site: Secondary | ICD-10-CM | POA: Diagnosis not present

## 2016-12-23 DIAGNOSIS — Z87891 Personal history of nicotine dependence: Secondary | ICD-10-CM | POA: Diagnosis not present

## 2016-12-23 DIAGNOSIS — R339 Retention of urine, unspecified: Secondary | ICD-10-CM | POA: Diagnosis not present

## 2016-12-23 DIAGNOSIS — G35 Multiple sclerosis: Secondary | ICD-10-CM | POA: Diagnosis not present

## 2016-12-23 DIAGNOSIS — R531 Weakness: Secondary | ICD-10-CM | POA: Diagnosis not present

## 2016-12-23 DIAGNOSIS — G629 Polyneuropathy, unspecified: Secondary | ICD-10-CM | POA: Diagnosis not present

## 2016-12-23 DIAGNOSIS — M5416 Radiculopathy, lumbar region: Secondary | ICD-10-CM | POA: Diagnosis not present

## 2016-12-23 DIAGNOSIS — J449 Chronic obstructive pulmonary disease, unspecified: Secondary | ICD-10-CM | POA: Diagnosis not present

## 2016-12-23 DIAGNOSIS — Z510 Encounter for antineoplastic radiation therapy: Secondary | ICD-10-CM | POA: Diagnosis not present

## 2016-12-23 DIAGNOSIS — I2729 Other secondary pulmonary hypertension: Secondary | ICD-10-CM | POA: Diagnosis not present

## 2016-12-23 DIAGNOSIS — C8213 Follicular lymphoma grade II, intra-abdominal lymph nodes: Secondary | ICD-10-CM | POA: Diagnosis not present

## 2016-12-23 DIAGNOSIS — I129 Hypertensive chronic kidney disease with stage 1 through stage 4 chronic kidney disease, or unspecified chronic kidney disease: Secondary | ICD-10-CM | POA: Diagnosis not present

## 2016-12-23 NOTE — Telephone Encounter (Signed)
notified

## 2016-12-24 ENCOUNTER — Inpatient Hospital Stay: Payer: Medicare Other

## 2016-12-24 ENCOUNTER — Ambulatory Visit
Admission: RE | Admit: 2016-12-24 | Discharge: 2016-12-24 | Disposition: A | Discharge status: Home / Self Care | Payer: Medicare Other | Source: Ambulatory Visit | Attending: Radiation Oncology | Admitting: Radiation Oncology

## 2016-12-24 DIAGNOSIS — N183 Chronic kidney disease, stage 3 (moderate): Secondary | ICD-10-CM | POA: Diagnosis not present

## 2016-12-24 DIAGNOSIS — I739 Peripheral vascular disease, unspecified: Secondary | ICD-10-CM | POA: Diagnosis not present

## 2016-12-24 DIAGNOSIS — Z87891 Personal history of nicotine dependence: Secondary | ICD-10-CM | POA: Diagnosis not present

## 2016-12-24 DIAGNOSIS — I2729 Other secondary pulmonary hypertension: Secondary | ICD-10-CM | POA: Diagnosis not present

## 2016-12-24 DIAGNOSIS — K5900 Constipation, unspecified: Secondary | ICD-10-CM | POA: Diagnosis not present

## 2016-12-24 DIAGNOSIS — Z510 Encounter for antineoplastic radiation therapy: Secondary | ICD-10-CM | POA: Diagnosis not present

## 2016-12-24 DIAGNOSIS — C8213 Follicular lymphoma grade II, intra-abdominal lymph nodes: Secondary | ICD-10-CM | POA: Diagnosis not present

## 2016-12-24 DIAGNOSIS — C8293 Follicular lymphoma, unspecified, intra-abdominal lymph nodes: Secondary | ICD-10-CM | POA: Diagnosis not present

## 2016-12-24 DIAGNOSIS — G35 Multiple sclerosis: Secondary | ICD-10-CM | POA: Diagnosis not present

## 2016-12-24 DIAGNOSIS — M5416 Radiculopathy, lumbar region: Secondary | ICD-10-CM | POA: Diagnosis not present

## 2016-12-24 DIAGNOSIS — M1990 Unspecified osteoarthritis, unspecified site: Secondary | ICD-10-CM | POA: Diagnosis not present

## 2016-12-24 DIAGNOSIS — G629 Polyneuropathy, unspecified: Secondary | ICD-10-CM | POA: Diagnosis not present

## 2016-12-24 DIAGNOSIS — J449 Chronic obstructive pulmonary disease, unspecified: Secondary | ICD-10-CM | POA: Diagnosis not present

## 2016-12-24 DIAGNOSIS — R531 Weakness: Secondary | ICD-10-CM | POA: Diagnosis not present

## 2016-12-24 DIAGNOSIS — G4733 Obstructive sleep apnea (adult) (pediatric): Secondary | ICD-10-CM | POA: Diagnosis not present

## 2016-12-24 DIAGNOSIS — I129 Hypertensive chronic kidney disease with stage 1 through stage 4 chronic kidney disease, or unspecified chronic kidney disease: Secondary | ICD-10-CM | POA: Diagnosis not present

## 2016-12-25 ENCOUNTER — Ambulatory Visit
Admission: RE | Admit: 2016-12-25 | Discharge: 2016-12-25 | Disposition: A | Discharge status: Home / Self Care | Payer: Medicare Other | Source: Ambulatory Visit | Attending: Radiation Oncology | Admitting: Radiation Oncology

## 2016-12-25 DIAGNOSIS — R531 Weakness: Secondary | ICD-10-CM | POA: Diagnosis not present

## 2016-12-25 DIAGNOSIS — J449 Chronic obstructive pulmonary disease, unspecified: Secondary | ICD-10-CM | POA: Diagnosis not present

## 2016-12-25 DIAGNOSIS — K5900 Constipation, unspecified: Secondary | ICD-10-CM | POA: Diagnosis not present

## 2016-12-25 DIAGNOSIS — C8213 Follicular lymphoma grade II, intra-abdominal lymph nodes: Secondary | ICD-10-CM | POA: Diagnosis not present

## 2016-12-25 DIAGNOSIS — G35 Multiple sclerosis: Secondary | ICD-10-CM | POA: Diagnosis not present

## 2016-12-25 DIAGNOSIS — Z510 Encounter for antineoplastic radiation therapy: Secondary | ICD-10-CM | POA: Diagnosis not present

## 2016-12-25 DIAGNOSIS — Z87891 Personal history of nicotine dependence: Secondary | ICD-10-CM | POA: Diagnosis not present

## 2016-12-26 ENCOUNTER — Ambulatory Visit
Admission: RE | Admit: 2016-12-26 | Discharge: 2016-12-26 | Disposition: A | Discharge status: Home / Self Care | Payer: Medicare Other | Source: Ambulatory Visit | Attending: Radiation Oncology | Admitting: Radiation Oncology

## 2016-12-26 DIAGNOSIS — C8293 Follicular lymphoma, unspecified, intra-abdominal lymph nodes: Secondary | ICD-10-CM | POA: Diagnosis not present

## 2016-12-26 DIAGNOSIS — N183 Chronic kidney disease, stage 3 (moderate): Secondary | ICD-10-CM | POA: Diagnosis not present

## 2016-12-26 DIAGNOSIS — G4733 Obstructive sleep apnea (adult) (pediatric): Secondary | ICD-10-CM | POA: Diagnosis not present

## 2016-12-26 DIAGNOSIS — Z510 Encounter for antineoplastic radiation therapy: Secondary | ICD-10-CM | POA: Diagnosis not present

## 2016-12-26 DIAGNOSIS — I129 Hypertensive chronic kidney disease with stage 1 through stage 4 chronic kidney disease, or unspecified chronic kidney disease: Secondary | ICD-10-CM | POA: Diagnosis not present

## 2016-12-26 DIAGNOSIS — C8213 Follicular lymphoma grade II, intra-abdominal lymph nodes: Secondary | ICD-10-CM | POA: Diagnosis not present

## 2016-12-26 DIAGNOSIS — M5416 Radiculopathy, lumbar region: Secondary | ICD-10-CM | POA: Diagnosis not present

## 2016-12-26 DIAGNOSIS — K5900 Constipation, unspecified: Secondary | ICD-10-CM | POA: Diagnosis not present

## 2016-12-26 DIAGNOSIS — Z87891 Personal history of nicotine dependence: Secondary | ICD-10-CM | POA: Diagnosis not present

## 2016-12-26 DIAGNOSIS — I739 Peripheral vascular disease, unspecified: Secondary | ICD-10-CM | POA: Diagnosis not present

## 2016-12-26 DIAGNOSIS — J449 Chronic obstructive pulmonary disease, unspecified: Secondary | ICD-10-CM | POA: Diagnosis not present

## 2016-12-26 DIAGNOSIS — G629 Polyneuropathy, unspecified: Secondary | ICD-10-CM | POA: Diagnosis not present

## 2016-12-26 DIAGNOSIS — R531 Weakness: Secondary | ICD-10-CM | POA: Diagnosis not present

## 2016-12-26 DIAGNOSIS — M1990 Unspecified osteoarthritis, unspecified site: Secondary | ICD-10-CM | POA: Diagnosis not present

## 2016-12-26 DIAGNOSIS — G35 Multiple sclerosis: Secondary | ICD-10-CM | POA: Diagnosis not present

## 2016-12-26 DIAGNOSIS — I2729 Other secondary pulmonary hypertension: Secondary | ICD-10-CM | POA: Diagnosis not present

## 2016-12-27 ENCOUNTER — Ambulatory Visit: Admission: RE | Admit: 2016-12-27 | Payer: Medicare Other | Source: Ambulatory Visit

## 2016-12-27 ENCOUNTER — Ambulatory Visit: Payer: Medicare Other

## 2016-12-29 ENCOUNTER — Encounter: Payer: Self-pay | Admitting: Emergency Medicine

## 2016-12-29 ENCOUNTER — Inpatient Hospital Stay: Payer: Medicare Other

## 2016-12-29 ENCOUNTER — Observation Stay
Admission: EM | Admit: 2016-12-29 | Discharge: 2016-12-30 | Disposition: A | Discharge status: Home / Self Care | Payer: Medicare Other | Attending: Internal Medicine | Admitting: Internal Medicine

## 2016-12-29 ENCOUNTER — Telehealth: Payer: Self-pay | Admitting: Oncology

## 2016-12-29 DIAGNOSIS — I959 Hypotension, unspecified: Secondary | ICD-10-CM | POA: Diagnosis not present

## 2016-12-29 DIAGNOSIS — F909 Attention-deficit hyperactivity disorder, unspecified type: Secondary | ICD-10-CM | POA: Diagnosis not present

## 2016-12-29 DIAGNOSIS — Z833 Family history of diabetes mellitus: Secondary | ICD-10-CM | POA: Insufficient documentation

## 2016-12-29 DIAGNOSIS — Z8261 Family history of arthritis: Secondary | ICD-10-CM | POA: Diagnosis not present

## 2016-12-29 DIAGNOSIS — Z87891 Personal history of nicotine dependence: Secondary | ICD-10-CM | POA: Diagnosis not present

## 2016-12-29 DIAGNOSIS — F419 Anxiety disorder, unspecified: Secondary | ICD-10-CM | POA: Insufficient documentation

## 2016-12-29 DIAGNOSIS — Z6841 Body Mass Index (BMI) 40.0 and over, adult: Secondary | ICD-10-CM | POA: Diagnosis not present

## 2016-12-29 DIAGNOSIS — J449 Chronic obstructive pulmonary disease, unspecified: Secondary | ICD-10-CM | POA: Diagnosis not present

## 2016-12-29 DIAGNOSIS — N390 Urinary tract infection, site not specified: Secondary | ICD-10-CM | POA: Diagnosis not present

## 2016-12-29 DIAGNOSIS — Z818 Family history of other mental and behavioral disorders: Secondary | ICD-10-CM | POA: Diagnosis not present

## 2016-12-29 DIAGNOSIS — Z79899 Other long term (current) drug therapy: Secondary | ICD-10-CM | POA: Insufficient documentation

## 2016-12-29 DIAGNOSIS — L03119 Cellulitis of unspecified part of limb: Secondary | ICD-10-CM

## 2016-12-29 DIAGNOSIS — N3000 Acute cystitis without hematuria: Secondary | ICD-10-CM | POA: Diagnosis not present

## 2016-12-29 DIAGNOSIS — R609 Edema, unspecified: Secondary | ICD-10-CM

## 2016-12-29 DIAGNOSIS — Z825 Family history of asthma and other chronic lower respiratory diseases: Secondary | ICD-10-CM | POA: Insufficient documentation

## 2016-12-29 DIAGNOSIS — L0390 Cellulitis, unspecified: Secondary | ICD-10-CM | POA: Diagnosis not present

## 2016-12-29 DIAGNOSIS — C8213 Follicular lymphoma grade II, intra-abdominal lymph nodes: Secondary | ICD-10-CM | POA: Diagnosis not present

## 2016-12-29 DIAGNOSIS — L03116 Cellulitis of left lower limb: Secondary | ICD-10-CM | POA: Diagnosis not present

## 2016-12-29 DIAGNOSIS — L02419 Cutaneous abscess of limb, unspecified: Secondary | ICD-10-CM | POA: Diagnosis present

## 2016-12-29 DIAGNOSIS — G35 Multiple sclerosis: Secondary | ICD-10-CM | POA: Insufficient documentation

## 2016-12-29 DIAGNOSIS — E871 Hypo-osmolality and hyponatremia: Secondary | ICD-10-CM | POA: Diagnosis not present

## 2016-12-29 DIAGNOSIS — F329 Major depressive disorder, single episode, unspecified: Secondary | ICD-10-CM | POA: Diagnosis not present

## 2016-12-29 DIAGNOSIS — M7989 Other specified soft tissue disorders: Secondary | ICD-10-CM | POA: Diagnosis not present

## 2016-12-29 DIAGNOSIS — Z811 Family history of alcohol abuse and dependence: Secondary | ICD-10-CM | POA: Insufficient documentation

## 2016-12-29 DIAGNOSIS — I10 Essential (primary) hypertension: Secondary | ICD-10-CM | POA: Insufficient documentation

## 2016-12-29 DIAGNOSIS — Z841 Family history of disorders of kidney and ureter: Secondary | ICD-10-CM | POA: Insufficient documentation

## 2016-12-29 DIAGNOSIS — M79605 Pain in left leg: Secondary | ICD-10-CM | POA: Diagnosis not present

## 2016-12-29 LAB — BASIC METABOLIC PANEL
Anion gap: 8 (ref 5–15)
BUN: 24 mg/dL — ABNORMAL HIGH (ref 6–20)
CO2: 24 mmol/L (ref 22–32)
Calcium: 8.7 mg/dL — ABNORMAL LOW (ref 8.9–10.3)
Chloride: 96 mmol/L — ABNORMAL LOW (ref 101–111)
Creatinine, Ser: 1.42 mg/dL — ABNORMAL HIGH (ref 0.44–1.00)
GFR calc Af Amer: 44 mL/min — ABNORMAL LOW (ref >60)
GFR calc non Af Amer: 38 mL/min — ABNORMAL LOW (ref >60)
Glucose, Bld: 129 mg/dL — ABNORMAL HIGH (ref 65–99)
Potassium: 3.1 mmol/L — ABNORMAL LOW (ref 3.5–5.1)
Sodium: 128 mmol/L — ABNORMAL LOW (ref 135–145)

## 2016-12-29 LAB — URINALYSIS, COMPLETE (UACMP) WITH MICROSCOPIC
Bilirubin Urine: NEGATIVE
Glucose, UA: NEGATIVE mg/dL
Ketones, ur: NEGATIVE mg/dL
Nitrite: POSITIVE — AB
Protein, ur: 30 mg/dL — AB
Specific Gravity, Urine: 1.013 (ref 1.005–1.030)
pH: 5 (ref 5.0–8.0)

## 2016-12-29 LAB — CBC WITH DIFFERENTIAL/PLATELET
Basophils Absolute: 0 10*3/uL (ref 0–0.1)
Basophils Relative: 0 %
Eosinophils Absolute: 0.5 10*3/uL (ref 0–0.7)
Eosinophils Relative: 7 %
HCT: 29.1 % — ABNORMAL LOW (ref 35.0–47.0)
Hemoglobin: 10.3 g/dL — ABNORMAL LOW (ref 12.0–16.0)
Lymphocytes Relative: 7 %
Lymphs Abs: 0.4 10*3/uL — ABNORMAL LOW (ref 1.0–3.6)
MCH: 29.3 pg (ref 26.0–34.0)
MCHC: 35.3 g/dL (ref 32.0–36.0)
MCV: 83.1 fL (ref 80.0–100.0)
Monocytes Absolute: 0.6 10*3/uL (ref 0.2–0.9)
Monocytes Relative: 9 %
Neutro Abs: 5 10*3/uL (ref 1.4–6.5)
Neutrophils Relative %: 77 %
Platelets: 158 10*3/uL (ref 150–440)
RBC: 3.5 MIL/uL — ABNORMAL LOW (ref 3.80–5.20)
RDW: 14.9 % — ABNORMAL HIGH (ref 11.5–14.5)
WBC: 6.5 10*3/uL (ref 3.6–11.0)

## 2016-12-29 LAB — SODIUM
Sodium: 130 mmol/L — ABNORMAL LOW (ref 135–145)
Sodium: 134 mmol/L — ABNORMAL LOW (ref 135–145)

## 2016-12-29 LAB — SODIUM, URINE, RANDOM: Sodium, Ur: 17 mmol/L

## 2016-12-29 LAB — LACTIC ACID, PLASMA: Lactic Acid, Venous: 1.7 mmol/L (ref 0.5–1.9)

## 2016-12-29 LAB — OSMOLALITY, URINE: Osmolality, Ur: 401 mOsm/kg (ref 300–900)

## 2016-12-29 MED ORDER — DEXTROSE 5 % IV SOLN
1.0000 g | INTRAVENOUS | Status: DC
  Administered 2016-12-30: 1 g via INTRAVENOUS
  Filled 2016-12-29: qty 10

## 2016-12-29 MED ORDER — SODIUM CHLORIDE 0.9 % IV BOLUS (SEPSIS)
1000.0000 mL | Freq: Once | INTRAVENOUS | Status: AC
  Administered 2016-12-29: 1000 mL via INTRAVENOUS

## 2016-12-29 MED ORDER — ACETAMINOPHEN 650 MG RE SUPP
650.0000 mg | Freq: Four times a day (QID) | RECTAL | Status: DC | PRN
Start: 2016-12-29 — End: 2016-12-30

## 2016-12-29 MED ORDER — METHYLPHENIDATE HCL 10 MG PO TABS
10.0000 mg | ORAL_TABLET | Freq: Two times a day (BID) | ORAL | Status: DC
  Administered 2016-12-30: 10 mg via ORAL
  Filled 2016-12-29: qty 1

## 2016-12-29 MED ORDER — DEXTROSE 5 % IV SOLN
1.0000 g | Freq: Once | INTRAVENOUS | Status: AC
  Administered 2016-12-29: 1 g via INTRAVENOUS
  Filled 2016-12-29: qty 10

## 2016-12-29 MED ORDER — VITAMIN C 500 MG PO TABS
500.0000 mg | ORAL_TABLET | Freq: Every day | ORAL | Status: DC
  Administered 2016-12-29 – 2016-12-30 (×2): 500 mg via ORAL
  Filled 2016-12-29 (×2): qty 1

## 2016-12-29 MED ORDER — ONDANSETRON HCL 4 MG/2ML IJ SOLN: 4.0000 mg | Freq: Four times a day (QID) | INTRAMUSCULAR | Status: DC | PRN

## 2016-12-29 MED ORDER — ONDANSETRON HCL 4 MG PO TABS
4.0000 mg | ORAL_TABLET | Freq: Four times a day (QID) | ORAL | Status: DC | PRN
Start: 2016-12-29 — End: 2016-12-30

## 2016-12-29 MED ORDER — POLYETHYLENE GLYCOL 3350 17 GM/SCOOP PO POWD
17.0000 g | Freq: Every day | ORAL | Status: DC | PRN
  Filled 2016-12-29: qty 255

## 2016-12-29 MED ORDER — CLONAZEPAM 0.5 MG PO TABS
1.0000 mg | ORAL_TABLET | Freq: Every day | ORAL | Status: DC
  Administered 2016-12-29: 1 mg via ORAL
  Filled 2016-12-29: qty 2

## 2016-12-29 MED ORDER — TRAMADOL HCL 50 MG PO TABS: 50.0000 mg | ORAL_TABLET | Freq: Four times a day (QID) | ORAL | Status: DC | PRN

## 2016-12-29 MED ORDER — DARIFENACIN HYDROBROMIDE ER 7.5 MG PO TB24
7.5000 mg | ORAL_TABLET | Freq: Every day | ORAL | Status: DC
  Administered 2016-12-30: 7.5 mg via ORAL
  Filled 2016-12-29 (×2): qty 1

## 2016-12-29 MED ORDER — GABAPENTIN 300 MG PO CAPS
300.0000 mg | ORAL_CAPSULE | Freq: Three times a day (TID) | ORAL | Status: DC
  Administered 2016-12-29 – 2016-12-30 (×3): 300 mg via ORAL
  Filled 2016-12-29 (×3): qty 1

## 2016-12-29 MED ORDER — POLYETHYLENE GLYCOL 3350 17 G PO PACK: 17.0000 g | PACK | Freq: Every day | ORAL | Status: DC | PRN

## 2016-12-29 MED ORDER — INTERFERON BETA-1A 30 MCG/0.5ML IM AJKT: 30.0000 ug | AUTO-INJECTOR | INTRAMUSCULAR | Status: DC

## 2016-12-29 MED ORDER — POTASSIUM CHLORIDE IN NACL 20-0.9 MEQ/L-% IV SOLN
INTRAVENOUS | Status: AC
  Administered 2016-12-29 – 2016-12-30 (×2): via INTRAVENOUS
  Filled 2016-12-29 (×3): qty 1000

## 2016-12-29 MED ORDER — QUETIAPINE FUMARATE ER 50 MG PO TB24
150.0000 mg | ORAL_TABLET | Freq: Every day | ORAL | Status: DC
  Administered 2016-12-29: 150 mg via ORAL
  Filled 2016-12-29 (×2): qty 3

## 2016-12-29 MED ORDER — FLUTICASONE FUROATE-VILANTEROL 100-25 MCG/INH IN AEPB
1.0000 | INHALATION_SPRAY | Freq: Every day | RESPIRATORY_TRACT | Status: DC
  Administered 2016-12-30: 10:00:00 1 via RESPIRATORY_TRACT
  Filled 2016-12-29: qty 28

## 2016-12-29 MED ORDER — ACETAMINOPHEN 325 MG PO TABS: 650.0000 mg | ORAL_TABLET | Freq: Four times a day (QID) | ORAL | Status: DC | PRN

## 2016-12-29 MED ORDER — TIZANIDINE HCL 4 MG PO TABS
4.0000 mg | ORAL_TABLET | Freq: Four times a day (QID) | ORAL | Status: DC
  Administered 2016-12-29 – 2016-12-30 (×3): 4 mg via ORAL
  Filled 2016-12-29 (×7): qty 1

## 2016-12-29 MED ORDER — BUPROPION HCL ER (XL) 150 MG PO TB24
300.0000 mg | ORAL_TABLET | ORAL | Status: DC
  Filled 2016-12-29: qty 2

## 2016-12-29 MED ORDER — ADULT MULTIVITAMIN W/MINERALS CH
1.0000 | ORAL_TABLET | Freq: Every day | ORAL | Status: DC
  Administered 2016-12-29 – 2016-12-30 (×2): 1 via ORAL
  Filled 2016-12-29 (×3): qty 1

## 2016-12-29 MED ORDER — POTASSIUM CHLORIDE CRYS ER 10 MEQ PO TBCR
10.0000 meq | EXTENDED_RELEASE_TABLET | Freq: Every day | ORAL | Status: DC
  Administered 2016-12-29 – 2016-12-30 (×2): 10 meq via ORAL
  Filled 2016-12-29 (×3): qty 1

## 2016-12-29 MED ORDER — ENOXAPARIN SODIUM 40 MG/0.4ML ~~LOC~~ SOLN
40.0000 mg | Freq: Two times a day (BID) | SUBCUTANEOUS | Status: DC
  Administered 2016-12-29 – 2016-12-30 (×2): 40 mg via SUBCUTANEOUS
  Filled 2016-12-29 (×2): qty 0.4

## 2016-12-29 MED ORDER — POLYETHYLENE GLYCOL 3350 17 GM/SCOOP PO POWD: 17.0000 g | Freq: Every day | ORAL | Status: DC | PRN

## 2016-12-29 MED ORDER — SENNOSIDES-DOCUSATE SODIUM 8.6-50 MG PO TABS
1.0000 | ORAL_TABLET | Freq: Every day | ORAL | Status: DC
  Administered 2016-12-30: 1 via ORAL
  Filled 2016-12-29: qty 1

## 2016-12-29 MED ORDER — GABAPENTIN 600 MG PO TABS
300.0000 mg | ORAL_TABLET | Freq: Three times a day (TID) | ORAL | Status: DC
  Filled 2016-12-29 (×2): qty 0.5

## 2016-12-29 MED ORDER — TOPIRAMATE 25 MG PO TABS
50.0000 mg | ORAL_TABLET | Freq: Every day | ORAL | Status: DC
  Administered 2016-12-30: 50 mg via ORAL
  Filled 2016-12-29: qty 2

## 2016-12-29 MED ORDER — DULOXETINE HCL 30 MG PO CPEP
60.0000 mg | ORAL_CAPSULE | ORAL | Status: DC
  Administered 2016-12-30: 60 mg via ORAL
  Filled 2016-12-29: qty 2

## 2016-12-29 NOTE — Progress Notes (Signed)
Anticoagulation monitoring(Lovenox):  66 yo female ordered Lovenox 30 mg Q24h  Filed Weights   12/29/16 0822  Weight: 298 lb (135.2 kg)   BMI 51    Lab Results  Component Value Date   CREATININE 1.42 (H) 12/29/2016   CREATININE 1.32 (H) 11/07/2016   CREATININE 1.42 (H) 10/24/2016   Estimated Creatinine Clearance: 54.2 mL/min (A) (by C-G formula based on SCr of 1.42 mg/dL (H)). Hemoglobin & Hematocrit     Component Value Date/Time   HGB 10.3 (L) 12/29/2016 0829   HGB 13.6 08/01/2013 2312   HCT 29.1 (L) 12/29/2016 0829   HCT 40.2 08/01/2013 2312     Per Protocol for Patient with estCrcl > 30 ml/min and BMI > 40, will transition to Lovenox 40 mg Q12h.

## 2016-12-29 NOTE — Telephone Encounter (Signed)
Patient's friend [name] called (patient on the side) reports that patient's lower extremity has been more swelling, oozing fluid. Her foot is very swollen and can not fit into her shoe. Also no urination for 4 hours.  Advise patient to go to ER for further evaluation.
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
## 2016-12-29 NOTE — ED Provider Notes (Signed)
[facility] Emergency Department Provider Note   ____________________________________________   I have reviewed the triage vital signs and the nursing notes.   HISTORY  Chief Complaint Wound Check   History limited by: Not Limited   HPI [name] is a 66 y.o. female who presents to the emergency department today via EMS because of concern for wound. It is located on the left lower leg. Patient states it has been there for a little while and she has talked to doctors and home health nurse about it in the past. For the past couple of days however it has started draining fluid. The patient states that she soaked her socks with the fluid. Apparently a friend called the oncologist last night who recommended that she be evaluated in the emergency department. Patient waited until today to present because she wanted to sleep in her own bed last night. In addition the patient states she has also had decreased urine output over the past few days. She has a foley catheter secondary to MS. She denies any fevers. No nausea or vomiting.     Past Medical History:  Diagnosis Date  . Abdominal aortic atherosclerosis ([place]) 11/11/2016  . ADHD   . Anxiety   . COPD (chronic obstructive pulmonary disease) ([place])   . Depression    major depressive  . Dyspnea    doe  . Edema    left leg  . Follicular lymphoma ([place])    B Cell  . Follicular lymphoma grade II ([place])   . Hypertension   . Hypotension    idiopathic  . Kyphoscoliosis and scoliosis 11/26/2011  . Morbid obesity ([place]) 01/05/2016  . Multiple sclerosis ([place])   . Multiple sclerosis ([place])    1980's  . Neuromuscular disorder ([place])   . Obstructive and reflux uropathy    foley  . Pain    atypical facial  . Peripheral vascular disease of lower extremity with ulceration ([place]) 11/08/2015  . Skin ulcer ([place]) 11/08/2015  . Weakness    generalized. has MS    Patient Active Problem List   Diagnosis Date Noted  . Left  ovarian cyst 12/09/2016  . Abdominal aortic atherosclerosis ([place]) 11/11/2016  . Cellulitis of left lower extremity 10/11/2016  . Swelling of lower extremity 10/11/2016  . Obstructive sleep apnea 10/11/2016  . Bilateral lower leg cellulitis 10/11/2016  . Follicular lymphoma of intra-abdominal lymph nodes ([place]) 08/06/2016  . Multiple falls 06/07/2016  . Inguinal adenopathy 05/29/2016  . Breast cancer screening 01/05/2016  . Morbid obesity ([place]) 01/05/2016  . Pressure ulcer 12/19/2015  . Low HDL (under 40) 12/19/2015  . Cellulitis 12/18/2015  . Skin ulcer ([place]) 11/08/2015  . Peripheral vascular disease of lower extremity with ulceration ([place]) 11/08/2015  . CKD (chronic kidney disease) stage 3, GFR 30-59 ml/min 11/08/2015  . Decubitus ulcer of left thigh, stage 2 04/18/2015  . Constipation due to pain medication 01/31/2015  . Obstructive sleep apnea of adult 01/13/2015  . Pelvic muscle wasting 01/13/2015  . Incomplete bladder emptying 01/13/2015  . Headache, migraine 10/24/2014  . 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 Current tobacco use 10/24/2014  . Lumbar radiculopathy, chronic 10/02/2013  . COPD with bronchial hyperresponsiveness ([place]) 10/02/2013  . Major depressive disorder, recurrent, in partial remission ([place]) 10/02/2013  . Hypertension goal BP (blood pressure) < 140/90 10/02/2013  . Multiple sclerosis ([place]) 10/02/2013  . Absence of bladder continence 09/25/2012  . Acontractile bladder 02/12/2012  . Bladder retention 02/12/2012  . Narrowing of  intervertebral disc space 11/26/2011  . Kyphoscoliosis and scoliosis 11/26/2011    Past Surgical History:  Procedure Laterality Date  . BACK SURGERY N/A 2002  . CYST EXCISION     lower back  . INGUINAL LYMPH NODE BIOPSY Left 07/04/2016   Procedure: INGUINAL LYMPH NODE BIOPSY;  Surgeon: Christene Lye, MD;  Location: [facility] ORS;  Service: General;  Laterality: Left;  . PORTACATH PLACEMENT N/A 07/22/2016   Procedure: INSERTION  PORT-A-CATH;  Surgeon: Christene Lye, MD;  Location: [facility] ORS;  Service: General;  Laterality: N/A;  . TONSILLECTOMY AND ADENOIDECTOMY    . TUBAL LIGATION      Prior to Admission medications   Medication Sig Start Date End Date Taking? Authorizing Provider  AVONEX PEN 30 MCG/0.5ML AJKT Inject 30 mcg into the skin every Monday. 04/28/16   [provider]  buPROPion (WELLBUTRIN XL) 300 MG 24 hr tablet Take 300 mg by mouth every morning.  05/28/16   [provider]  clonazePAM (KLONOPIN) 1 MG tablet Take 1 tablet (1 mg total) by mouth at bedtime. 10/14/16   Vaughan Basta, MD  DULoxetine (CYMBALTA) 60 MG capsule Take 1 capsule (60 mg total) by mouth every morning. 10/14/16   Vaughan Basta, MD  fluticasone furoate-vilanterol (BREO ELLIPTA) 100-25 MCG/INH AEPB Inhale 1 puff into the lungs daily. 01/05/16   Lada, Satira Anis, MD  gabapentin (NEURONTIN) 600 MG tablet TAKE 1 TABLET (600 MG TOTAL) BY MOUTH 3 (THREE) TIMES DAILY. 08/07/16   Lada, Satira Anis, MD  hydrochlorothiazide (MICROZIDE) 12.5 MG capsule TAKE ONE CAPSULE (12.5 MG TOTAL) BY MOUTH EVERY DAY (THIS REPLACES TRIAMTERENE/HCTZ) 08/07/16   Lada, Satira Anis, MD  lidocaine-prilocaine (EMLA) cream Apply 1 application topically as needed. apply cream over the port site and then place small piece of saran wrap over the cream to protect your clothing 11/07/16   Sindy Guadeloupe, MD  lisinopril (PRINIVIL,ZESTRIL) 5 MG tablet Take 1 tablet (5 mg total) by mouth daily. 11/13/16   Arnetha Courser, MD  methylphenidate (RITALIN) 10 MG tablet Take 1 tablet (10 mg total) by mouth 2 (two) times daily. 10/14/16   Vaughan Basta, MD  Multiple Vitamin (MULTIVITAMIN WITH MINERALS) TABS tablet Take 1 tablet by mouth daily.    [provider]  polyethylene glycol powder (GLYCOLAX/MIRALAX) powder Take 17 g by mouth daily. Mixed in water 03/11/16   Lada, Satira Anis, MD  potassium chloride (K-DUR) 10 MEQ tablet Take 1 tablet  (10 mEq total) by mouth daily. 12/22/15   Arnetha Courser, MD  predniSONE (DELTASONE) 1 MG tablet Take 1 mg by mouth daily with breakfast.    [provider]  QUEtiapine Fumarate (SEROQUEL XR) 150 MG 24 hr tablet 150 mg at bedtime.  08/14/16   [provider]  senna-docusate (SENOKOT-S) 8.6-50 MG tablet Take 1 tablet by mouth daily.    [provider]  solifenacin (VESICARE) 5 MG tablet Take 5 mg by mouth daily.    [provider]  tiZANidine (ZANAFLEX) 4 MG tablet Take 1 tablet (4 mg total) by mouth 4 (four) times daily. TAKES FOR MS 10/14/16   Vaughan Basta, MD  topiramate (TOPAMAX) 50 MG tablet Take 1 tablet (50 mg total) by mouth daily. 10/14/16   Vaughan Basta, MD  vitamin C (ASCORBIC ACID) 500 MG tablet Take 500 mg by mouth daily.    [provider]    Allergies Patient has no known allergies.  Family History  Problem Relation Age of Onset  .  COPD Mother   . Diabetes Mother   . Heart failure Mother   . Alcohol abuse Father   . Kidney disease Father   . Kidney failure Father   . Arthritis Sister   . CAD Maternal Grandmother   . Stroke Maternal Grandfather   . Arthritis Sister   . Mental illness Sister   . Arthritis Brother     Social History Social History  Substance Use Topics  . Smoking status: Former Smoker    Packs/day: 1.00    Years: 20.00    Types: Cigarettes    Quit date: 02/03/2016  . Smokeless tobacco: Never Used  . Alcohol use No    Review of Systems Constitutional: No fever/chills Eyes: No visual changes. ENT: No sore throat. Cardiovascular: Denies chest pain. Respiratory: Denies shortness of breath. Gastrointestinal: No abdominal pain.  No nausea, no vomiting.  No diarrhea.   Genitourinary: Decreased urinary output.  Musculoskeletal: Negative for back pain. Skin: Redness and wound to left lower leg.  Neurological: Negative for headaches, focal weakness or  numbness.  ____________________________________________   PHYSICAL EXAM:  VITAL SIGNS: ED Triage Vitals  Enc Vitals Group     BP 12/29/16 0822 (!) 85/39     Pulse Rate 12/29/16 0822 72     Resp 12/29/16 0825 15     Temp 12/29/16 0825 97.7 F (36.5 C)     Temp src --      SpO2 12/29/16 0822 98 %     Weight 12/29/16 0822 298 lb (135.2 kg)     Height 12/29/16 0822 5\' 4"  (1.626 m)     Head Circumference --      Peak Flow --      Pain Score 12/29/16 0821 0   Constitutional: Alert and oriented. Well appearing and in no distress. Eyes: Conjunctivae are normal.  ENT   Head: Normocephalic and atraumatic.   Nose: No congestion/rhinnorhea.   Mouth/Throat: Mucous membranes are moist.   Neck: No stridor. Hematological/Lymphatic/Immunilogical: No cervical lymphadenopathy. Cardiovascular: Normal rate, regular rhythm.  No murmurs, rubs, or gallops.  Respiratory: Normal respiratory effort without tachypnea nor retractions. Breath sounds are clear and equal bilaterally. No wheezes/rales/rhonchi. Gastrointestinal: Soft and non tender. No rebound. No guarding.  Genitourinary: Deferred Musculoskeletal: Normal range of motion in all extremities. No lower extremity edema. Neurologic:  Normal speech and language. No gross focal neurologic deficits are appreciated.  Skin:  Erythema warmth and small amount of swelling to left lower leg. Small roughly 1.5 cm diameter wound with some weeping.  Psychiatric: Mood and affect are normal. Speech and behavior are normal. Patient exhibits appropriate insight and judgment.  ____________________________________________    LABS (pertinent positives/negatives)  Labs Reviewed  CBC WITH DIFFERENTIAL/PLATELET - Abnormal; Notable for the following:       Result Value   RBC 3.50 (*)    Hemoglobin 10.3 (*)    HCT 29.1 (*)    RDW 14.9 (*)    Lymphs Abs 0.4 (*)    All other components within normal limits  URINALYSIS, COMPLETE (UACMP) WITH  MICROSCOPIC - Abnormal; Notable for the following:    Color, Urine YELLOW (*)    APPearance CLOUDY (*)    Hgb urine dipstick MODERATE (*)    Protein, ur 30 (*)    Nitrite POSITIVE (*)    Leukocytes, UA LARGE (*)    Bacteria, UA MANY (*)    Squamous Epithelial / LPF 0-5 (*)    All other components within normal limits  BASIC  METABOLIC PANEL - Abnormal; Notable for the following:    Sodium 128 (*)    Potassium 3.1 (*)    Chloride 96 (*)    Glucose, Bld 129 (*)    BUN 24 (*)    Creatinine, Ser 1.42 (*)    Calcium 8.7 (*)    GFR calc non Af Amer 38 (*)    GFR calc Af Amer 44 (*)    All other components within normal limits  URINE CULTURE  LACTIC ACID, PLASMA  LACTIC ACID, PLASMA     ____________________________________________   EKG  None  ____________________________________________    RADIOLOGY  None  ____________________________________________   PROCEDURES  Procedures  ____________________________________________   INITIAL IMPRESSION / ASSESSMENT AND PLAN / ED COURSE  Pertinent labs & imaging results that were available during my care of the patient were reviewed by me and considered in my medical decision making (see chart for details).  Patient presented to the emergency department today with primary concern for cellulitis. Workup was consistent with cellulitis. It does also. Patient has a urinary tract infection. Patient's blood pressure initially quite low although that did improve after IV fluids. Patient will be given IV Rocephin to try to help both infections.  patient will be admitted to the hospitalist service given low blood pressure and electrolyte abnormalities.  ____________________________________________   FINAL CLINICAL IMPRESSION(S) / ED DIAGNOSES  Final diagnoses:  Hypotension, unspecified hypotension type  Cellulitis, unspecified cellulitis site  Lower urinary tract infection     Note: This dictation was prepared with Dragon  dictation. Any transcriptional errors that result from this process are unintentional     Nance Pear, MD 12/29/16 [phone]

## 2016-12-29 NOTE — ED Triage Notes (Signed)
Patient arrives from home today with c/o left lower lateral leg wound. Patient was sent on the request of Oncology. Patient was seen by other MD's for same. Patient was seen here prior for cellulitis. Secondary complaint is poor urine output. EMS reports "orange color urine"

## 2016-12-29 NOTE — Care Management Note (Signed)
Case Management Note  Patient Details  Name: [name] MRN: [id number] Date of Birth: Jan 01, 1951  Subjective/Objective:      Received a call from [place], hospital liaison for [facility] who reported that [name] is an active patient of [facility], and is receiving PT, RN, OT, Nurse Aide with [facility].               Action/Plan:   Expected Discharge Date:                  Expected Discharge Plan:     In-House Referral:     Discharge planning Services     Post Acute Care Choice:    Choice offered to:     DME Arranged:    DME Agency:     HH Arranged:    HH Agency:     Status of Service:     If discussed at [employer] of Stay Meetings, dates discussed:    Additional Comments:  Laquiesha Piacente A, RN 12/29/2016, 1:28 PM

## 2016-12-29 NOTE — H&P (Signed)
[facility] [place] NAME: [name]    MR#:  [id number]  DATE OF BIRTH:  Aug 19, 1950  DATE OF ADMISSION:  12/29/2016  PRIMARY CARE PHYSICIAN: Arnetha Courser, MD   REQUESTING/REFERRING PHYSICIAN: Archie Balboa   CHIEF COMPLAINT:   Leg swelling and oozing HISTORY OF PRESENT ILLNESS:  [name]  is a 66 y.o. female with a known history of ADHD, COPD, anxiety, depression, multiple sclerosis chronic indwelling Foley catheter, obstructive sleep apnea not on CPAP machine, follicular B-cell lymphoma stage II on radiation therapy is presenting to the ED with a chief complaint of worsening of the left leg redness associated with swelling. Patient noticed some oozing of fluid from the left leg and came into the emergency department. Patient reports the left leg is swollen on and off for the past several days and has been lately getting worse. Patient has called her oncologist who has recommended to go to the ED. Urine looks abnormal. Patient is started on IV Rocephin and hospitalist team is called to admit the patient.  PAST MEDICAL HISTORY:   Past Medical History:  Diagnosis Date  . Abdominal aortic atherosclerosis ([place]) 11/11/2016  . ADHD   . Anxiety   . COPD (chronic obstructive pulmonary disease) ([place])   . Depression    major depressive  . Dyspnea    doe  . Edema    left leg  . Follicular lymphoma ([place])    B Cell  . Follicular lymphoma grade II ([place])   . Hypertension   . Hypotension    idiopathic  . Kyphoscoliosis and scoliosis 11/26/2011  . Morbid obesity ([place]) 01/05/2016  . Multiple sclerosis ([place])   . Multiple sclerosis ([place])    1980's  . Neuromuscular disorder ([place])   . Obstructive and reflux uropathy    foley  . Pain    atypical facial  . Peripheral vascular disease of lower extremity with ulceration ([place]) 11/08/2015  . Skin ulcer ([place]) 11/08/2015  . Weakness    generalized. has MS    PAST SURGICAL HISTOIRY:   Past  Surgical History:  Procedure Laterality Date  . BACK SURGERY N/A 2002  . CYST EXCISION     lower back  . INGUINAL LYMPH NODE BIOPSY Left 07/04/2016   Procedure: INGUINAL LYMPH NODE BIOPSY;  Surgeon: Christene Lye, MD;  Location: [facility] ORS;  Service: General;  Laterality: Left;  . PORTACATH PLACEMENT N/A 07/22/2016   Procedure: INSERTION PORT-A-CATH;  Surgeon: Christene Lye, MD;  Location: [facility] ORS;  Service: General;  Laterality: N/A;  . TONSILLECTOMY AND ADENOIDECTOMY    . TUBAL LIGATION      SOCIAL HISTORY:   Social History  Substance Use Topics  . Smoking status: Former Smoker    Packs/day: 1.00    Years: 20.00    Types: Cigarettes    Quit date: 02/03/2016  . Smokeless tobacco: Never Used  . Alcohol use No    FAMILY HISTORY:   Family History  Problem Relation Age of Onset  . COPD Mother   . Diabetes Mother   . Heart failure Mother   . Alcohol abuse Father   . Kidney disease Father   . Kidney failure Father   . Arthritis Sister   . CAD Maternal Grandmother   . Stroke Maternal Grandfather   . Arthritis Sister   . Mental illness Sister   . Arthritis Brother     DRUG ALLERGIES:  No Known Allergies  REVIEW OF  SYSTEMS:  CONSTITUTIONAL: No fever, fatigue or weakness.  EYES: No blurred or double vision.  EARS, NOSE, AND THROAT: No tinnitus or ear pain.  RESPIRATORY: No cough, shortness of breath, wheezing or hemoptysis.  CARDIOVASCULAR: No chest pain, orthopnea, edema.  GASTROINTESTINAL: No nausea, vomiting, diarrhea or abdominal pain.  GENITOURINARY: No dysuria, hematuria.  ENDOCRINE: No polyuria, nocturia,  HEMATOLOGY: No anemia, easy bruising or bleeding SKIN: patient is reporting left leg redness, swelling and some discharge from the wound. MUSCULOSKELETAL: No joint pain or arthritis.   NEUROLOGIC: No tingling, numbness, weakness.  PSYCHIATRY: No anxiety or depression.   MEDICATIONS AT HOME:   Prior to Admission medications   Medication Sig  Start Date End Date Taking? Authorizing Provider  buPROPion (WELLBUTRIN XL) 300 MG 24 hr tablet Take 300 mg by mouth every morning.  05/28/16  Yes [provider]  clonazePAM (KLONOPIN) 1 MG tablet Take 1 tablet (1 mg total) by mouth at bedtime. 10/14/16  Yes Vaughan Basta, MD  DULoxetine (CYMBALTA) 60 MG capsule Take 1 capsule (60 mg total) by mouth every morning. 10/14/16  Yes Vaughan Basta, MD  fluticasone furoate-vilanterol (BREO ELLIPTA) 100-25 MCG/INH AEPB Inhale 1 puff into the lungs daily. 01/05/16  Yes Lada, Satira Anis, MD  gabapentin (NEURONTIN) 600 MG tablet TAKE 1 TABLET (600 MG TOTAL) BY MOUTH 3 (THREE) TIMES DAILY. Patient taking differently: Take 300 mg by mouth 3 (three) times daily.  08/07/16  Yes Lada, Satira Anis, MD  hydrochlorothiazide (MICROZIDE) 12.5 MG capsule TAKE ONE CAPSULE (12.5 MG TOTAL) BY MOUTH EVERY DAY (THIS REPLACES TRIAMTERENE/HCTZ) 08/07/16  Yes Lada, Satira Anis, MD  lidocaine-prilocaine (EMLA) cream Apply 1 application topically as needed. apply cream over the port site and then place small piece of saran wrap over the cream to protect your clothing 11/07/16  Yes Sindy Guadeloupe, MD  lisinopril (PRINIVIL,ZESTRIL) 5 MG tablet Take 1 tablet (5 mg total) by mouth daily. 11/13/16  Yes Lada, Satira Anis, MD  methylphenidate (RITALIN) 10 MG tablet Take 1 tablet (10 mg total) by mouth 2 (two) times daily. 10/14/16  Yes Vaughan Basta, MD  Multiple Vitamin (MULTIVITAMIN WITH MINERALS) TABS tablet Take 1 tablet by mouth daily.   Yes [provider]  polyethylene glycol powder (GLYCOLAX/MIRALAX) powder Take 17 g by mouth daily. Mixed in water 03/11/16  Yes Lada, Satira Anis, MD  QUEtiapine Fumarate (SEROQUEL XR) 150 MG 24 hr tablet 150 mg at bedtime.  08/14/16  Yes [provider]  senna-docusate (SENOKOT-S) 8.6-50 MG tablet Take 1 tablet by mouth daily.   Yes [provider]  solifenacin (VESICARE) 10 MG tablet Take 10 mg by mouth  daily.    Yes [provider]  tiZANidine (ZANAFLEX) 4 MG tablet Take 1 tablet (4 mg total) by mouth 4 (four) times daily. TAKES FOR MS 10/14/16  Yes Vaughan Basta, MD  topiramate (TOPAMAX) 50 MG tablet Take 1 tablet (50 mg total) by mouth daily. 10/14/16  Yes Vaughan Basta, MD  vitamin C (ASCORBIC ACID) 500 MG tablet Take 500 mg by mouth daily.   Yes [provider]  AVONEX PEN 30 MCG/0.5ML AJKT Inject 30 mcg into the skin every Monday. 04/28/16   [provider]  potassium chloride (K-DUR) 10 MEQ tablet Take 1 tablet (10 mEq total) by mouth daily. Patient not taking: Reported on 12/29/2016 12/22/15   Arnetha Courser, MD      VITAL SIGNS:  Blood pressure 111/60, pulse (!) 55, temperature 97.7 F (36.5 C), resp.  rate 13, height 5\' 4"  (1.626 m), weight 135.2 kg (298 lb), SpO2 100 %.  PHYSICAL EXAMINATION:  GENERAL:  66 y.o.-year-old patient lying in the bed with no acute distress.  EYES: Pupils equal, round, reactive to light and accommodation. No scleral icterus. Extraocular muscles intact.  HEENT: Head atraumatic, normocephalic. Oropharynx and nasopharynx clear.  NECK:  Supple, no jugular venous distention. No thyroid enlargement, no tenderness.  LUNGS: Normal breath sounds bilaterally, no wheezing, rales,rhonchi or crepitation. No use of accessory muscles of respiration.  CARDIOVASCULAR: S1, S2 normal. No murmurs, rubs, or gallops.  ABDOMEN: Soft, nontender, nondistended. Bowel sounds present. Has chronic indwelling Foley catheter EXTREMITIES:left lower leg including foot is erythematous, edematous and tender. Oozing of fluid is noticed from the wound Which is small and around 1.5 cm . No  cyanosis, or clubbing.  NEUROLOGIC: awake and alert, oriented 3. PSYCHIATRIC: The patient is alert and oriented x 3.  SKIN: No obvious rash, lesion, or ulcer.   LABORATORY PANEL:   CBC  Recent Labs Lab 12/29/16 0829  WBC 6.5  HGB 10.3*  HCT 29.1*  PLT  158   ------------------------------------------------------------------------------------------------------------------  Chemistries   Recent Labs Lab 12/29/16 0829 12/29/16 1300  NA 128* 130*  K 3.1*  --   CL 96*  --   CO2 24  --   GLUCOSE 129*  --   BUN 24*  --   CREATININE 1.42*  --   CALCIUM 8.7*  --    ------------------------------------------------------------------------------------------------------------------  Cardiac Enzymes No results for input(s): TROPONINI in the last 168 hours. ------------------------------------------------------------------------------------------------------------------  RADIOLOGY:  No results found.  EKG:   Orders placed or performed during the hospital encounter of 10/11/16  . EKG 12-Lead  . EKG 12-Lead  . EKG 12-Lead  . EKG 12-Lead  . EKG    IMPRESSION AND PLAN:   Kirsti Mcalpine  is a 66 y.o. female with a known history of ADHD, COPD, anxiety, depression, multiple sclerosis chronic indwelling Foley catheter, obstructive sleep apnea not on CPAP machine, follicular B-cell lymphoma stage II on radiation therapy is presenting to the ED with a chief complaint of worsening of the left leg redness associated with swelling. Patient noticed some oozing of fluid from the left leg and came into the emergency department.   # ACUTE LEFT LOWER EXTREMITY CELLULITIS Admit to MedSurg unit Wound culture and sensitivity, IV Rocephin Pain management as needed Left lower extending to venous Doppler to rule out DVT  #Acute cystitis abnormal urinalysis Urine culture and sensitivity IV Rocephin and gentle hydration with IV fluids  #Hyponatremia Hydrated with IV fluids and monitor sodium level Patient is mentating fine Check serial sodiums, urine is alert and urine lites  #Hypokalemia replete and recheck in a.m. Patient was on potassium supplements before but she stopped taking them, unclear etiology  #Chronic history of multiple sclerosis and  indwelling Foley catheter Continue her home medications and replace Foley catheter with a new one  #Obstacle to sleep apnea patient is not on CPAP  #Chronic history of follicular B cell lymphoma Currently on radiation therapy last treatment was on last Thursday Outpatient follow-up with oncology Dr. Tasia Catchings    All the records are reviewed and case discussed with ED provider. Management plans discussed with the patient, family and they are in agreement.  CODE STATUS: fc/ husband HCPOA  TOTAL TIME TAKING CARE OF THIS PATIENT: 44  minutes.   Note: This dictation was prepared with Dragon dictation along with smaller phrase technology. Any transcriptional errors that  result from this process are unintentional.  Nicholes Mango M.D on 12/29/2016 at 1:56 PM  Between 7am to 6pm - Pager - [phone]  After 6pm go to [email or web address] - password [email or web address]  Office  [phone]  CC: Primary care physician; Arnetha Courser, MD

## 2016-12-30 DIAGNOSIS — C8210 Follicular lymphoma grade II, unspecified site: Secondary | ICD-10-CM | POA: Diagnosis present

## 2016-12-30 DIAGNOSIS — T83511A Infection and inflammatory reaction due to indwelling urethral catheter, initial encounter: Secondary | ICD-10-CM | POA: Diagnosis not present

## 2016-12-30 DIAGNOSIS — Z9221 Personal history of antineoplastic chemotherapy: Secondary | ICD-10-CM

## 2016-12-30 DIAGNOSIS — Z87891 Personal history of nicotine dependence: Secondary | ICD-10-CM | POA: Diagnosis not present

## 2016-12-30 DIAGNOSIS — L03116 Cellulitis of left lower limb: Secondary | ICD-10-CM | POA: Diagnosis not present

## 2016-12-30 DIAGNOSIS — E871 Hypo-osmolality and hyponatremia: Secondary | ICD-10-CM | POA: Diagnosis present

## 2016-12-30 DIAGNOSIS — G35 Multiple sclerosis: Secondary | ICD-10-CM | POA: Diagnosis present

## 2016-12-30 DIAGNOSIS — I129 Hypertensive chronic kidney disease with stage 1 through stage 4 chronic kidney disease, or unspecified chronic kidney disease: Secondary | ICD-10-CM | POA: Diagnosis not present

## 2016-12-30 DIAGNOSIS — G4730 Sleep apnea, unspecified: Secondary | ICD-10-CM | POA: Diagnosis present

## 2016-12-30 DIAGNOSIS — Z466 Encounter for fitting and adjustment of urinary device: Secondary | ICD-10-CM | POA: Diagnosis not present

## 2016-12-30 DIAGNOSIS — F909 Attention-deficit hyperactivity disorder, unspecified type: Secondary | ICD-10-CM | POA: Diagnosis present

## 2016-12-30 DIAGNOSIS — G4733 Obstructive sleep apnea (adult) (pediatric): Secondary | ICD-10-CM | POA: Diagnosis not present

## 2016-12-30 DIAGNOSIS — N139 Obstructive and reflux uropathy, unspecified: Secondary | ICD-10-CM | POA: Diagnosis not present

## 2016-12-30 DIAGNOSIS — J449 Chronic obstructive pulmonary disease, unspecified: Secondary | ICD-10-CM | POA: Diagnosis present

## 2016-12-30 DIAGNOSIS — Z510 Encounter for antineoplastic radiation therapy: Secondary | ICD-10-CM | POA: Diagnosis not present

## 2016-12-30 DIAGNOSIS — K5900 Constipation, unspecified: Secondary | ICD-10-CM | POA: Diagnosis not present

## 2016-12-30 DIAGNOSIS — C8213 Follicular lymphoma grade II, intra-abdominal lymph nodes: Secondary | ICD-10-CM | POA: Diagnosis not present

## 2016-12-30 DIAGNOSIS — I10 Essential (primary) hypertension: Secondary | ICD-10-CM | POA: Diagnosis not present

## 2016-12-30 DIAGNOSIS — I739 Peripheral vascular disease, unspecified: Secondary | ICD-10-CM | POA: Diagnosis not present

## 2016-12-30 DIAGNOSIS — N390 Urinary tract infection, site not specified: Secondary | ICD-10-CM | POA: Diagnosis present

## 2016-12-30 DIAGNOSIS — E876 Hypokalemia: Secondary | ICD-10-CM | POA: Diagnosis present

## 2016-12-30 DIAGNOSIS — M5416 Radiculopathy, lumbar region: Secondary | ICD-10-CM | POA: Diagnosis not present

## 2016-12-30 DIAGNOSIS — M6281 Muscle weakness (generalized): Secondary | ICD-10-CM | POA: Diagnosis not present

## 2016-12-30 DIAGNOSIS — N183 Chronic kidney disease, stage 3 (moderate): Secondary | ICD-10-CM | POA: Diagnosis not present

## 2016-12-30 DIAGNOSIS — B965 Pseudomonas (aeruginosa) (mallei) (pseudomallei) as the cause of diseases classified elsewhere: Secondary | ICD-10-CM | POA: Diagnosis not present

## 2016-12-30 DIAGNOSIS — Z79899 Other long term (current) drug therapy: Secondary | ICD-10-CM

## 2016-12-30 DIAGNOSIS — F329 Major depressive disorder, single episode, unspecified: Secondary | ICD-10-CM | POA: Diagnosis present

## 2016-12-30 DIAGNOSIS — X58XXXA Exposure to other specified factors, initial encounter: Secondary | ICD-10-CM | POA: Diagnosis present

## 2016-12-30 DIAGNOSIS — G629 Polyneuropathy, unspecified: Secondary | ICD-10-CM | POA: Diagnosis not present

## 2016-12-30 DIAGNOSIS — Z6841 Body Mass Index (BMI) 40.0 and over, adult: Secondary | ICD-10-CM

## 2016-12-30 DIAGNOSIS — N3000 Acute cystitis without hematuria: Secondary | ICD-10-CM | POA: Diagnosis not present

## 2016-12-30 DIAGNOSIS — M419 Scoliosis, unspecified: Secondary | ICD-10-CM | POA: Diagnosis present

## 2016-12-30 DIAGNOSIS — C8290 Follicular lymphoma, unspecified, unspecified site: Secondary | ICD-10-CM | POA: Diagnosis not present

## 2016-12-30 DIAGNOSIS — I2729 Other secondary pulmonary hypertension: Secondary | ICD-10-CM | POA: Diagnosis not present

## 2016-12-30 DIAGNOSIS — L03115 Cellulitis of right lower limb: Secondary | ICD-10-CM | POA: Diagnosis not present

## 2016-12-30 DIAGNOSIS — R531 Weakness: Secondary | ICD-10-CM | POA: Diagnosis not present

## 2016-12-30 DIAGNOSIS — C8293 Follicular lymphoma, unspecified, intra-abdominal lymph nodes: Secondary | ICD-10-CM | POA: Diagnosis not present

## 2016-12-30 DIAGNOSIS — M1990 Unspecified osteoarthritis, unspecified site: Secondary | ICD-10-CM | POA: Diagnosis not present

## 2016-12-30 DIAGNOSIS — F419 Anxiety disorder, unspecified: Secondary | ICD-10-CM | POA: Diagnosis present

## 2016-12-30 DIAGNOSIS — Z923 Personal history of irradiation: Secondary | ICD-10-CM

## 2016-12-30 LAB — COMPREHENSIVE METABOLIC PANEL
ALT: 16 U/L (ref 14–54)
AST: 20 U/L (ref 15–41)
Albumin: 3.1 g/dL — ABNORMAL LOW (ref 3.5–5.0)
Alkaline Phosphatase: 52 U/L (ref 38–126)
Anion gap: 6 (ref 5–15)
BUN: 19 mg/dL (ref 6–20)
CO2: 24 mmol/L (ref 22–32)
Calcium: 8.4 mg/dL — ABNORMAL LOW (ref 8.9–10.3)
Chloride: 104 mmol/L (ref 101–111)
Creatinine, Ser: 1.22 mg/dL — ABNORMAL HIGH (ref 0.44–1.00)
GFR calc Af Amer: 53 mL/min — ABNORMAL LOW (ref >60)
GFR calc non Af Amer: 45 mL/min — ABNORMAL LOW (ref >60)
Glucose, Bld: 132 mg/dL — ABNORMAL HIGH (ref 65–99)
Potassium: 3.8 mmol/L (ref 3.5–5.1)
Sodium: 134 mmol/L — ABNORMAL LOW (ref 135–145)
Total Bilirubin: 0.5 mg/dL (ref 0.3–1.2)
Total Protein: 6.1 g/dL — ABNORMAL LOW (ref 6.5–8.1)

## 2016-12-30 LAB — CBC
HCT: 29.9 % — ABNORMAL LOW (ref 35.0–47.0)
Hemoglobin: 10.3 g/dL — ABNORMAL LOW (ref 12.0–16.0)
MCH: 29.4 pg (ref 26.0–34.0)
MCHC: 34.5 g/dL (ref 32.0–36.0)
MCV: 85 fL (ref 80.0–100.0)
Platelets: 154 10*3/uL (ref 150–440)
RBC: 3.51 MIL/uL — ABNORMAL LOW (ref 3.80–5.20)
RDW: 15.6 % — ABNORMAL HIGH (ref 11.5–14.5)
WBC: 5.5 10*3/uL (ref 3.6–11.0)

## 2016-12-30 MED ORDER — GABAPENTIN 600 MG PO TABS: 300.0000 mg | ORAL_TABLET | Freq: Three times a day (TID) | ORAL | Status: DC

## 2016-12-30 MED ORDER — CEPHALEXIN 500 MG PO CAPS: 500.0000 mg | ORAL_CAPSULE | Freq: Three times a day (TID) | ORAL | 0 refills | Status: DC
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
## 2016-12-30 NOTE — Progress Notes (Signed)
I was called by staff after patient was discharged that her husband had a few questions. I did speak with him earlier during the day about her discharge plan. He reports that after patient got home she had 2 loose stools.  I asked that patient take antibiotics with food and if this persists she will need evaluation by PCP or come back to ED for evaluation.  Patient did not have diarrhea while in the hospital as per my conversation with nursing staff.

## 2016-12-30 NOTE — Care Management CC44 (Signed)
Condition Code 44 Documentation Completed  Patient Details  Name: IVANELL DESHOTEL MRN: [id number] Date of Birth: [birth year]   Condition Code 44 given:  Yes Patient signature on Condition Code 44 notice:  Yes Documentation of 2 MD's agreement:  Yes Code 44 added to claim:  Yes    Jolly Mango, RN 12/30/2016, 9:02 AM

## 2016-12-30 NOTE — Care Management Obs Status (Signed)
[place] NOTIFICATION   Patient Details  Name: ANNTIONETTE MADKINS MRN: [id number] Date of Birth: 09/20/1950   Medicare Observation Status Notification Given:  Yes    Jolly Mango, RN 12/30/2016, 9:02 AM

## 2016-12-30 NOTE — Progress Notes (Signed)
Pt discharged to home via wheelchair without incident per MD order accompanied by spouse. Prior to discharge, all discharge teachings done both written and verbal with spouse present. All questions answered. Pt discharged with prescription for Keflex. Pt prepared for d/c home by Maudie Mercury NT. Per Maudie Mercury NT, pt placed in two hospital gowns for discharge home due to her home clothes were soiled. Per Maudie Mercury NT, pt stated that her spouse was supposed to bring her clean clothes but did not. No change in pt from AM assessment. Pt pain controlled on d/c.

## 2016-12-30 NOTE — Progress Notes (Signed)
During conference this AM, reported to [place] and Mel Almond MSW that pt is to be d/c today. Lattie Haw RN CM states pt is set up with home health PT. Pt has [facility]. Per Mel Almond MSW, pt has been offered long term care and pt refuses. Per conference report pt mobility is at baseline. Will continue to monitor.

## 2016-12-30 NOTE — Progress Notes (Signed)
Contacted by patient's room mate, Geronimo Boot. She stated patient's husband wanted to speak with Dr. Benjie Karvonen. Called Dr. Benjie Karvonen and requested she contact Mr. Godwin on [phone].

## 2016-12-30 NOTE — Progress Notes (Signed)
Phone call received from "Juliann Pulse" who states she is a "friend" of the patient. She was asking for urine results and verbalized that pt is not suitable to go home because of her physical mobility status. I explained to her that I could not give her test results and I asked her if I could have Dr. Benjie Karvonen call the husband and discuss patient's plan of care. Husband's number obtained. Pt asked if she cared if Dr. Benjie Karvonen called her husband and discussed her plan of care. Pt states it would be "ok". Pt expressed concern a couple of times this AM that she didn't think her family would come get her. Dr. Benjie Karvonen made aware of the situation. She will call husband. Will continue to monitor.

## 2016-12-31 ENCOUNTER — Inpatient Hospital Stay: Payer: Medicare Other | Attending: Radiation Oncology

## 2016-12-31 ENCOUNTER — Ambulatory Visit
Admission: RE | Admit: 2016-12-31 | Discharge: 2016-12-31 | Disposition: A | Discharge status: Home / Self Care | Payer: Medicare Other | Source: Ambulatory Visit | Attending: Radiation Oncology | Admitting: Radiation Oncology

## 2016-12-31 DIAGNOSIS — G35 Multiple sclerosis: Secondary | ICD-10-CM | POA: Diagnosis not present

## 2016-12-31 DIAGNOSIS — C8213 Follicular lymphoma grade II, intra-abdominal lymph nodes: Secondary | ICD-10-CM | POA: Diagnosis not present

## 2016-12-31 DIAGNOSIS — M5416 Radiculopathy, lumbar region: Secondary | ICD-10-CM | POA: Diagnosis not present

## 2016-12-31 DIAGNOSIS — C8293 Follicular lymphoma, unspecified, intra-abdominal lymph nodes: Secondary | ICD-10-CM | POA: Diagnosis not present

## 2016-12-31 DIAGNOSIS — Z510 Encounter for antineoplastic radiation therapy: Secondary | ICD-10-CM | POA: Diagnosis not present

## 2016-12-31 DIAGNOSIS — G4733 Obstructive sleep apnea (adult) (pediatric): Secondary | ICD-10-CM | POA: Diagnosis not present

## 2016-12-31 DIAGNOSIS — N183 Chronic kidney disease, stage 3 (moderate): Secondary | ICD-10-CM | POA: Diagnosis not present

## 2016-12-31 DIAGNOSIS — N839 Noninflammatory disorder of ovary, fallopian tube and broad ligament, unspecified: Secondary | ICD-10-CM | POA: Insufficient documentation

## 2016-12-31 DIAGNOSIS — M1990 Unspecified osteoarthritis, unspecified site: Secondary | ICD-10-CM | POA: Diagnosis not present

## 2016-12-31 DIAGNOSIS — G629 Polyneuropathy, unspecified: Secondary | ICD-10-CM | POA: Diagnosis not present

## 2016-12-31 DIAGNOSIS — I739 Peripheral vascular disease, unspecified: Secondary | ICD-10-CM | POA: Diagnosis not present

## 2016-12-31 DIAGNOSIS — Z87891 Personal history of nicotine dependence: Secondary | ICD-10-CM | POA: Diagnosis not present

## 2016-12-31 DIAGNOSIS — J449 Chronic obstructive pulmonary disease, unspecified: Secondary | ICD-10-CM | POA: Diagnosis not present

## 2016-12-31 DIAGNOSIS — R531 Weakness: Secondary | ICD-10-CM | POA: Diagnosis not present

## 2016-12-31 DIAGNOSIS — K5900 Constipation, unspecified: Secondary | ICD-10-CM | POA: Diagnosis not present

## 2016-12-31 DIAGNOSIS — I2729 Other secondary pulmonary hypertension: Secondary | ICD-10-CM | POA: Diagnosis not present

## 2016-12-31 DIAGNOSIS — I129 Hypertensive chronic kidney disease with stage 1 through stage 4 chronic kidney disease, or unspecified chronic kidney disease: Secondary | ICD-10-CM | POA: Diagnosis not present

## 2017-01-01 ENCOUNTER — Ambulatory Visit
Admission: RE | Admit: 2017-01-01 | Discharge: 2017-01-01 | Disposition: A | Discharge status: Home / Self Care | Payer: Medicare Other | Source: Ambulatory Visit | Attending: Radiation Oncology | Admitting: Radiation Oncology

## 2017-01-01 ENCOUNTER — Ambulatory Visit: Payer: Self-pay | Admitting: *Deleted

## 2017-01-01 ENCOUNTER — Other Ambulatory Visit: Payer: Self-pay | Admitting: *Deleted

## 2017-01-01 DIAGNOSIS — R531 Weakness: Secondary | ICD-10-CM | POA: Diagnosis not present

## 2017-01-01 DIAGNOSIS — M5416 Radiculopathy, lumbar region: Secondary | ICD-10-CM | POA: Diagnosis not present

## 2017-01-01 DIAGNOSIS — M1990 Unspecified osteoarthritis, unspecified site: Secondary | ICD-10-CM | POA: Diagnosis not present

## 2017-01-01 DIAGNOSIS — G629 Polyneuropathy, unspecified: Secondary | ICD-10-CM | POA: Diagnosis not present

## 2017-01-01 DIAGNOSIS — C8213 Follicular lymphoma grade II, intra-abdominal lymph nodes: Secondary | ICD-10-CM | POA: Diagnosis not present

## 2017-01-01 DIAGNOSIS — K5900 Constipation, unspecified: Secondary | ICD-10-CM | POA: Diagnosis not present

## 2017-01-01 DIAGNOSIS — I739 Peripheral vascular disease, unspecified: Secondary | ICD-10-CM | POA: Diagnosis not present

## 2017-01-01 DIAGNOSIS — Z87891 Personal history of nicotine dependence: Secondary | ICD-10-CM | POA: Diagnosis not present

## 2017-01-01 DIAGNOSIS — C8293 Follicular lymphoma, unspecified, intra-abdominal lymph nodes: Secondary | ICD-10-CM | POA: Diagnosis not present

## 2017-01-01 DIAGNOSIS — N183 Chronic kidney disease, stage 3 (moderate): Secondary | ICD-10-CM | POA: Diagnosis not present

## 2017-01-01 DIAGNOSIS — J449 Chronic obstructive pulmonary disease, unspecified: Secondary | ICD-10-CM | POA: Diagnosis not present

## 2017-01-01 DIAGNOSIS — I129 Hypertensive chronic kidney disease with stage 1 through stage 4 chronic kidney disease, or unspecified chronic kidney disease: Secondary | ICD-10-CM | POA: Diagnosis not present

## 2017-01-01 DIAGNOSIS — G35 Multiple sclerosis: Secondary | ICD-10-CM | POA: Diagnosis not present

## 2017-01-01 DIAGNOSIS — Z510 Encounter for antineoplastic radiation therapy: Secondary | ICD-10-CM | POA: Diagnosis not present

## 2017-01-01 DIAGNOSIS — G4733 Obstructive sleep apnea (adult) (pediatric): Secondary | ICD-10-CM | POA: Diagnosis not present

## 2017-01-01 DIAGNOSIS — I2729 Other secondary pulmonary hypertension: Secondary | ICD-10-CM | POA: Diagnosis not present

## 2017-01-01 NOTE — Patient Outreach (Signed)
Unsuccessful telephone encounter to [name], 66 year old female for follow up on recent hospital observation stay September 2-3,2018 for UTI, Hypotension,cellulitis of left lower leg.  This RN CM followed pt for transition of care/recent SNF discharge 11/08/16, admitted after June 15-18,2018 hospitalization for swelling,cellulitis of left lower extremity.  Transition of care program completed 12/02/16.   Unable to leave pt a voice message as message on phone- voice message not set up, try later.    Plan:  RN CM to follow up again within next 2 days.    Zara Chess.   [facility]  [phone]

## 2017-01-02 ENCOUNTER — Telehealth: Payer: Self-pay | Admitting: *Deleted

## 2017-01-02 ENCOUNTER — Ambulatory Visit
Admission: RE | Admit: 2017-01-02 | Discharge: 2017-01-02 | Disposition: A | Discharge status: Home / Self Care | Payer: Medicare Other | Source: Ambulatory Visit | Attending: Radiation Oncology | Admitting: Radiation Oncology

## 2017-01-02 ENCOUNTER — Encounter: Payer: Self-pay | Admitting: Emergency Medicine

## 2017-01-02 ENCOUNTER — Inpatient Hospital Stay
Admission: EM | Admit: 2017-01-02 | Discharge: 2017-01-06 | DRG: 699 | Disposition: A | Discharge status: Home / Self Care | Payer: Medicare Other | Attending: Internal Medicine | Admitting: Internal Medicine

## 2017-01-02 ENCOUNTER — Other Ambulatory Visit: Payer: Self-pay | Admitting: *Deleted

## 2017-01-02 DIAGNOSIS — Z923 Personal history of irradiation: Secondary | ICD-10-CM | POA: Diagnosis not present

## 2017-01-02 DIAGNOSIS — I739 Peripheral vascular disease, unspecified: Secondary | ICD-10-CM | POA: Diagnosis present

## 2017-01-02 DIAGNOSIS — F909 Attention-deficit hyperactivity disorder, unspecified type: Secondary | ICD-10-CM | POA: Diagnosis present

## 2017-01-02 DIAGNOSIS — E871 Hypo-osmolality and hyponatremia: Secondary | ICD-10-CM | POA: Diagnosis present

## 2017-01-02 DIAGNOSIS — M419 Scoliosis, unspecified: Secondary | ICD-10-CM | POA: Diagnosis present

## 2017-01-02 DIAGNOSIS — F419 Anxiety disorder, unspecified: Secondary | ICD-10-CM | POA: Diagnosis present

## 2017-01-02 DIAGNOSIS — N139 Obstructive and reflux uropathy, unspecified: Secondary | ICD-10-CM | POA: Diagnosis present

## 2017-01-02 DIAGNOSIS — I10 Essential (primary) hypertension: Secondary | ICD-10-CM | POA: Diagnosis present

## 2017-01-02 DIAGNOSIS — G35 Multiple sclerosis: Secondary | ICD-10-CM | POA: Diagnosis present

## 2017-01-02 DIAGNOSIS — Z6841 Body Mass Index (BMI) 40.0 and over, adult: Secondary | ICD-10-CM | POA: Diagnosis not present

## 2017-01-02 DIAGNOSIS — J449 Chronic obstructive pulmonary disease, unspecified: Secondary | ICD-10-CM | POA: Diagnosis present

## 2017-01-02 DIAGNOSIS — N390 Urinary tract infection, site not specified: Secondary | ICD-10-CM

## 2017-01-02 DIAGNOSIS — Z87891 Personal history of nicotine dependence: Secondary | ICD-10-CM | POA: Diagnosis not present

## 2017-01-02 DIAGNOSIS — C8213 Follicular lymphoma grade II, intra-abdominal lymph nodes: Secondary | ICD-10-CM | POA: Diagnosis not present

## 2017-01-02 DIAGNOSIS — T83511A Infection and inflammatory reaction due to indwelling urethral catheter, initial encounter: Secondary | ICD-10-CM | POA: Diagnosis present

## 2017-01-02 DIAGNOSIS — E876 Hypokalemia: Secondary | ICD-10-CM | POA: Diagnosis present

## 2017-01-02 DIAGNOSIS — B965 Pseudomonas (aeruginosa) (mallei) (pseudomallei) as the cause of diseases classified elsewhere: Secondary | ICD-10-CM | POA: Diagnosis present

## 2017-01-02 DIAGNOSIS — C8210 Follicular lymphoma grade II, unspecified site: Secondary | ICD-10-CM | POA: Diagnosis present

## 2017-01-02 DIAGNOSIS — R531 Weakness: Secondary | ICD-10-CM | POA: Diagnosis not present

## 2017-01-02 DIAGNOSIS — G4730 Sleep apnea, unspecified: Secondary | ICD-10-CM | POA: Diagnosis present

## 2017-01-02 DIAGNOSIS — L03116 Cellulitis of left lower limb: Secondary | ICD-10-CM | POA: Diagnosis present

## 2017-01-02 DIAGNOSIS — K5900 Constipation, unspecified: Secondary | ICD-10-CM | POA: Diagnosis not present

## 2017-01-02 DIAGNOSIS — Z79899 Other long term (current) drug therapy: Secondary | ICD-10-CM | POA: Diagnosis not present

## 2017-01-02 DIAGNOSIS — Z9221 Personal history of antineoplastic chemotherapy: Secondary | ICD-10-CM | POA: Diagnosis not present

## 2017-01-02 DIAGNOSIS — K5903 Drug induced constipation: Secondary | ICD-10-CM

## 2017-01-02 DIAGNOSIS — L03115 Cellulitis of right lower limb: Secondary | ICD-10-CM

## 2017-01-02 DIAGNOSIS — Z510 Encounter for antineoplastic radiation therapy: Secondary | ICD-10-CM | POA: Diagnosis not present

## 2017-01-02 DIAGNOSIS — F329 Major depressive disorder, single episode, unspecified: Secondary | ICD-10-CM | POA: Diagnosis present

## 2017-01-02 DIAGNOSIS — X58XXXA Exposure to other specified factors, initial encounter: Secondary | ICD-10-CM | POA: Diagnosis present

## 2017-01-02 LAB — URINALYSIS, COMPLETE (UACMP) WITH MICROSCOPIC
Bacteria, UA: NONE SEEN
Bilirubin Urine: NEGATIVE
Glucose, UA: NEGATIVE mg/dL
Ketones, ur: NEGATIVE mg/dL
Nitrite: NEGATIVE
Protein, ur: 100 mg/dL — AB
Specific Gravity, Urine: 1.01 (ref 1.005–1.030)
pH: 5 (ref 5.0–8.0)

## 2017-01-02 LAB — URINE CULTURE: Culture: >=100000 — AB

## 2017-01-02 LAB — COMPREHENSIVE METABOLIC PANEL
ALT: 21 U/L (ref 14–54)
AST: 25 U/L (ref 15–41)
Albumin: 4.1 g/dL (ref 3.5–5.0)
Alkaline Phosphatase: 73 U/L (ref 38–126)
Anion gap: 11 (ref 5–15)
BUN: 15 mg/dL (ref 6–20)
CO2: 22 mmol/L (ref 22–32)
Calcium: 9.6 mg/dL (ref 8.9–10.3)
Chloride: 93 mmol/L — ABNORMAL LOW (ref 101–111)
Creatinine, Ser: 1.15 mg/dL — ABNORMAL HIGH (ref 0.44–1.00)
GFR calc Af Amer: 56 mL/min — ABNORMAL LOW (ref >60)
GFR calc non Af Amer: 48 mL/min — ABNORMAL LOW (ref >60)
Glucose, Bld: 90 mg/dL (ref 65–99)
Potassium: 3.9 mmol/L (ref 3.5–5.1)
Sodium: 126 mmol/L — ABNORMAL LOW (ref 135–145)
Total Bilirubin: 0.8 mg/dL (ref 0.3–1.2)
Total Protein: 8 g/dL (ref 6.5–8.1)

## 2017-01-02 LAB — CBC WITH DIFFERENTIAL/PLATELET
Basophils Absolute: 0 10*3/uL (ref 0–0.1)
Basophils Relative: 0 %
Eosinophils Absolute: 0.4 10*3/uL (ref 0–0.7)
Eosinophils Relative: 6 %
HCT: 32.5 % — ABNORMAL LOW (ref 35.0–47.0)
Hemoglobin: 11.5 g/dL — ABNORMAL LOW (ref 12.0–16.0)
Lymphocytes Relative: 3 %
Lymphs Abs: 0.2 10*3/uL — ABNORMAL LOW (ref 1.0–3.6)
MCH: 29.4 pg (ref 26.0–34.0)
MCHC: 35.3 g/dL (ref 32.0–36.0)
MCV: 83.1 fL (ref 80.0–100.0)
Monocytes Absolute: 0.6 10*3/uL (ref 0.2–0.9)
Monocytes Relative: 10 %
Neutro Abs: 5.1 10*3/uL (ref 1.4–6.5)
Neutrophils Relative %: 81 %
Platelets: 207 10*3/uL (ref 150–440)
RBC: 3.91 MIL/uL (ref 3.80–5.20)
RDW: 15.2 % — ABNORMAL HIGH (ref 11.5–14.5)
WBC: 6.4 10*3/uL (ref 3.6–11.0)

## 2017-01-02 LAB — LACTIC ACID, PLASMA: Lactic Acid, Venous: 1.2 mmol/L (ref 0.5–1.9)

## 2017-01-02 MED ORDER — SODIUM CHLORIDE 0.9 % IV SOLN
INTRAVENOUS | Status: DC
  Administered 2017-01-02 – 2017-01-04 (×5): via INTRAVENOUS

## 2017-01-02 MED ORDER — CIPROFLOXACIN HCL 500 MG PO TABS: 500.0000 mg | ORAL_TABLET | Freq: Once | ORAL | Status: DC

## 2017-01-02 MED ORDER — BUPROPION HCL ER (XL) 150 MG PO TB24
300.0000 mg | ORAL_TABLET | ORAL | Status: DC
  Administered 2017-01-03 – 2017-01-06 (×4): 300 mg via ORAL
  Filled 2017-01-02 (×4): qty 2

## 2017-01-02 MED ORDER — ACETAMINOPHEN 650 MG RE SUPP
650.0000 mg | Freq: Four times a day (QID) | RECTAL | Status: DC | PRN
Start: 2017-01-02 — End: 2017-01-06

## 2017-01-02 MED ORDER — PIPERACILLIN-TAZOBACTAM 3.375 G IVPB 30 MIN
3.3750 g | Freq: Once | INTRAVENOUS | Status: AC
  Administered 2017-01-02: 3.375 g via INTRAVENOUS
  Filled 2017-01-02 (×2): qty 50

## 2017-01-02 MED ORDER — QUETIAPINE FUMARATE ER 50 MG PO TB24
150.0000 mg | ORAL_TABLET | Freq: Every day | ORAL | Status: DC
  Administered 2017-01-02 – 2017-01-05 (×4): 150 mg via ORAL
  Filled 2017-01-02 (×6): qty 3

## 2017-01-02 MED ORDER — GABAPENTIN 600 MG PO TABS
300.0000 mg | ORAL_TABLET | Freq: Three times a day (TID) | ORAL | Status: DC
  Administered 2017-01-02 – 2017-01-03 (×3): 300 mg via ORAL
  Filled 2017-01-02 (×4): qty 1

## 2017-01-02 MED ORDER — ACETAMINOPHEN 325 MG PO TABS
650.0000 mg | ORAL_TABLET | Freq: Four times a day (QID) | ORAL | Status: DC | PRN
  Administered 2017-01-04: 650 mg via ORAL
  Filled 2017-01-02: qty 2

## 2017-01-02 MED ORDER — ONDANSETRON HCL 4 MG/2ML IJ SOLN: 4.0000 mg | Freq: Four times a day (QID) | INTRAMUSCULAR | Status: DC | PRN

## 2017-01-02 MED ORDER — FLUTICASONE FUROATE-VILANTEROL 100-25 MCG/INH IN AEPB
1.0000 | INHALATION_SPRAY | Freq: Every day | RESPIRATORY_TRACT | Status: DC
  Administered 2017-01-03 – 2017-01-06 (×4): 1 via RESPIRATORY_TRACT
  Filled 2017-01-02: qty 28

## 2017-01-02 MED ORDER — ONDANSETRON HCL 4 MG PO TABS
4.0000 mg | ORAL_TABLET | Freq: Four times a day (QID) | ORAL | Status: DC | PRN
  Administered 2017-01-04: 4 mg via ORAL
  Filled 2017-01-02: qty 1

## 2017-01-02 MED ORDER — METHYLPHENIDATE HCL 10 MG PO TABS
10.0000 mg | ORAL_TABLET | Freq: Two times a day (BID) | ORAL | Status: DC
  Administered 2017-01-03 – 2017-01-06 (×7): 10 mg via ORAL
  Filled 2017-01-02 (×7): qty 1

## 2017-01-02 MED ORDER — SENNOSIDES-DOCUSATE SODIUM 8.6-50 MG PO TABS
1.0000 | ORAL_TABLET | Freq: Two times a day (BID) | ORAL | Status: DC
  Administered 2017-01-02 – 2017-01-03 (×3): 1 via ORAL
  Filled 2017-01-02 (×4): qty 1

## 2017-01-02 MED ORDER — PIPERACILLIN-TAZOBACTAM 3.375 G IVPB
3.3750 g | Freq: Three times a day (TID) | INTRAVENOUS | Status: DC
  Administered 2017-01-03 – 2017-01-06 (×10): 3.375 g via INTRAVENOUS
  Filled 2017-01-02 (×12): qty 50

## 2017-01-02 MED ORDER — CLONAZEPAM 1 MG PO TABS
1.0000 mg | ORAL_TABLET | Freq: Every day | ORAL | Status: DC
  Administered 2017-01-02 – 2017-01-05 (×4): 1 mg via ORAL
  Filled 2017-01-02 (×4): qty 1

## 2017-01-02 MED ORDER — ALBUTEROL SULFATE (2.5 MG/3ML) 0.083% IN NEBU
2.5000 mg | INHALATION_SOLUTION | RESPIRATORY_TRACT | Status: DC | PRN
Start: 2017-01-02 — End: 2017-01-06
  Administered 2017-01-03 – 2017-01-05 (×2): 2.5 mg via RESPIRATORY_TRACT
  Filled 2017-01-02 (×2): qty 3

## 2017-01-02 MED ORDER — TOPIRAMATE 25 MG PO TABS
50.0000 mg | ORAL_TABLET | Freq: Every day | ORAL | Status: DC
  Administered 2017-01-03 – 2017-01-06 (×4): 50 mg via ORAL
  Filled 2017-01-02 (×4): qty 2

## 2017-01-02 MED ORDER — ENOXAPARIN SODIUM 40 MG/0.4ML ~~LOC~~ SOLN
40.0000 mg | SUBCUTANEOUS | Status: DC
  Administered 2017-01-02: 40 mg via SUBCUTANEOUS
  Filled 2017-01-02: qty 0.4

## 2017-01-02 MED ORDER — BISACODYL 10 MG RE SUPP
10.0000 mg | Freq: Every day | RECTAL | Status: DC | PRN
  Filled 2017-01-02: qty 1

## 2017-01-02 MED ORDER — TIZANIDINE HCL 4 MG PO TABS
4.0000 mg | ORAL_TABLET | Freq: Four times a day (QID) | ORAL | Status: DC
  Administered 2017-01-02 – 2017-01-06 (×14): 4 mg via ORAL
  Filled 2017-01-02 (×20): qty 1

## 2017-01-02 MED ORDER — DULOXETINE HCL 30 MG PO CPEP
60.0000 mg | ORAL_CAPSULE | ORAL | Status: DC
  Administered 2017-01-03 – 2017-01-06 (×4): 60 mg via ORAL
  Filled 2017-01-02 (×4): qty 2

## 2017-01-02 MED ORDER — POLYETHYLENE GLYCOL 3350 17 G PO PACK: 17.0000 g | PACK | Freq: Every day | ORAL | Status: DC | PRN

## 2017-01-02 MED ORDER — DARIFENACIN HYDROBROMIDE ER 7.5 MG PO TB24
7.5000 mg | ORAL_TABLET | Freq: Every day | ORAL | Status: DC
  Administered 2017-01-03 – 2017-01-06 (×4): 7.5 mg via ORAL
  Filled 2017-01-02 (×4): qty 1

## 2017-01-02 NOTE — Progress Notes (Signed)
Pharmacy Antibiotic Note  [name] is a 66 y.o. female admitted on 01/02/2017 with urinary tract infection and cellulitis .  Pharmacy has been consulted for Zosyn dosing.  Plan: Will start Zosyn 3.375 g IV q8 hours  Height: 5\' 5"  (165.1 cm) Weight: 298 lb (135.2 kg) IBW/kg (Calculated) : 57  Temp (24hrs), Avg:98.9 F (37.2 C), Min:98.9 F (37.2 C), Max:98.9 F (37.2 C)   Recent Labs Lab 12/29/16 0829 12/30/16 0256 01/02/17 1803 01/02/17 1901  WBC 6.5 5.5 6.4  --   CREATININE 1.42* 1.22* 1.15*  --   LATICACIDVEN 1.7  --   --  1.2    Estimated Creatinine Clearance: 67.1 mL/min (A) (by C-G formula based on SCr of 1.15 mg/dL (H)).    No Known Allergies  Antimicrobials this admission: Zosyn  9/6  >>    >>   Dose adjustments this admission:   Microbiology results:  BCx:   UCx:    Sputum:    MRSA PCR:   Thank you for allowing pharmacy to be a part of this patient's care.  Ladora Osterberg D 01/02/2017 8:17 PM

## 2017-01-02 NOTE — ED Triage Notes (Signed)
Patient presents to ED via POV from home. Patient was told she has pseudomonas in her urine and needed IV antibiotics. Chronic foley noted.

## 2017-01-02 NOTE — Telephone Encounter (Signed)
Asking if patient needs to be on IV antibiotics in regards to her Urine culture and her urine being red yesterday.  [name] is not on the release of information

## 2017-01-02 NOTE — H&P (Signed)
[facility] [place] NAME: [name]    MR#:  [id number]  DATE OF BIRTH:  [birth year]  DATE OF ADMISSION:  01/02/2017  PRIMARY CARE PHYSICIAN: Arnetha Courser, MD   REQUESTING/REFERRING PHYSICIAN: Dr. Alfred Levins  CHIEF COMPLAINT:   Chief Complaint  Patient presents with  . Urinary Tract Infection    HISTORY OF PRESENT ILLNESS:  [name]  is a 66 y.o. female with a known history of Multiple sclerosis, muscle spasms, chronic Foley catheter, hypertension who was recently in the hospital for UTI and left flexor tinnitus returns due to her oncologist Dr. Janese Banks calling her that cultures show pseudomonas in the urine. Wanted to start her on ciprofloxacin but due to interaction with nizatidine patient was asked to come to the emergency room. Also family very frustrated that patient was in the hospital only for one day and discharged. Redness in the left leg still persistent. Patient is afebrile and has normal WBC.  PAST MEDICAL HISTORY:   Past Medical History:  Diagnosis Date  . Abdominal aortic atherosclerosis ([place]) 11/11/2016  . ADHD   . Anxiety   . COPD (chronic obstructive pulmonary disease) ([place])   . Depression    major depressive  . Dyspnea    doe  . Edema    left leg  . Follicular lymphoma ([place])    B Cell  . Follicular lymphoma grade II ([place])   . Hypertension   . Hypotension    idiopathic  . Kyphoscoliosis and scoliosis 11/26/2011  . Morbid obesity ([place]) 01/05/2016  . Multiple sclerosis ([employer])   . Multiple sclerosis ([place])    1980's  . Neuromuscular disorder ([place])   . Obstructive and reflux uropathy    foley  . Pain    atypical facial  . Peripheral vascular disease of lower extremity with ulceration ([place]) 11/08/2015  . Skin ulcer ([place]) 11/08/2015  . Weakness    generalized. has MS    PAST SURGICAL HISTORY:   Past Surgical History:  Procedure Laterality Date  . BACK SURGERY N/A 2002  . CYST EXCISION     lower back  .  INGUINAL LYMPH NODE BIOPSY Left 07/04/2016   Procedure: INGUINAL LYMPH NODE BIOPSY;  Surgeon: Christene Lye, MD;  Location: [facility] ORS;  Service: General;  Laterality: Left;  . PORTACATH PLACEMENT N/A 07/22/2016   Procedure: INSERTION PORT-A-CATH;  Surgeon: Christene Lye, MD;  Location: [facility] ORS;  Service: General;  Laterality: N/A;  . TONSILLECTOMY AND ADENOIDECTOMY    . TUBAL LIGATION      SOCIAL HISTORY:   Social History  Substance Use Topics  . Smoking status: Former Smoker    Packs/day: 1.00    Years: 20.00    Types: Cigarettes    Quit date: 02/03/2016  . Smokeless tobacco: Never Used  . Alcohol use No    FAMILY HISTORY:   Family History  Problem Relation Age of Onset  . COPD Mother   . Diabetes Mother   . Heart failure Mother   . Alcohol abuse Father   . Kidney disease Father   . Kidney failure Father   . Arthritis Sister   . CAD Maternal Grandmother   . Stroke Maternal Grandfather   . Arthritis Sister   . Mental illness Sister   . Arthritis Brother     DRUG ALLERGIES:  No Known Allergies  REVIEW OF SYSTEMS:   Review of Systems  Constitutional: Positive for malaise/fatigue. Negative for chills and fever.  HENT: Negative for sore throat.   Eyes: Negative for blurred vision, double vision and pain.  Respiratory: Negative for cough, hemoptysis, shortness of breath and wheezing.   Cardiovascular: Negative for chest pain, palpitations, orthopnea and leg swelling.  Gastrointestinal: Negative for abdominal pain, constipation, diarrhea, heartburn, nausea and vomiting.  Genitourinary: Negative for dysuria and hematuria.  Musculoskeletal: Positive for myalgias. Negative for back pain and joint pain.  Skin: Negative for rash.  Neurological: Positive for weakness. Negative for sensory change, speech change, focal weakness and headaches.  Endo/Heme/Allergies: Does not bruise/bleed easily.  Psychiatric/Behavioral: Negative for depression and suicidal ideas.  The patient is not nervous/anxious.     MEDICATIONS AT HOME:   Prior to Admission medications   Medication Sig Start Date End Date Taking? Authorizing Provider  buPROPion (WELLBUTRIN XL) 300 MG 24 hr tablet Take 300 mg by mouth every morning.  05/28/16  Yes [provider]  cephALEXin (KEFLEX) 500 MG capsule Take 1 capsule (500 mg total) by mouth 3 (three) times daily. 12/30/16 01/07/17 Yes Mody, Ulice Bold, MD  clonazePAM (KLONOPIN) 1 MG tablet Take 1 tablet (1 mg total) by mouth at bedtime. 10/14/16  Yes Vaughan Basta, MD  DULoxetine (CYMBALTA) 60 MG capsule Take 1 capsule (60 mg total) by mouth every morning. 10/14/16  Yes Vaughan Basta, MD  fluticasone furoate-vilanterol (BREO ELLIPTA) 100-25 MCG/INH AEPB Inhale 1 puff into the lungs daily. 01/05/16  Yes Lada, Satira Anis, MD  gabapentin (NEURONTIN) 600 MG tablet Take 0.5 tablets (300 mg total) by mouth 3 (three) times daily. 12/30/16  Yes Mody, Ulice Bold, MD  hydrochlorothiazide (MICROZIDE) 12.5 MG capsule Take 12.5 mg by mouth daily.   Yes [provider]  interferon beta-1a (AVONEX) 30 MCG/0.5ML PSKT injection Inject 30 mcg into the muscle every Monday.   Yes [provider]  lidocaine-prilocaine (EMLA) cream Apply 1 application topically as needed. apply cream over the port site and then place small piece of saran wrap over the cream to protect your clothing 11/07/16  Yes Sindy Guadeloupe, MD  lisinopril (PRINIVIL,ZESTRIL) 5 MG tablet Take 1 tablet (5 mg total) by mouth daily. 11/13/16  Yes Lada, Satira Anis, MD  methylphenidate (RITALIN) 10 MG tablet Take 1 tablet (10 mg total) by mouth 2 (two) times daily. 10/14/16  Yes Vaughan Basta, MD  Multiple Vitamin (MULTIVITAMIN WITH MINERALS) TABS tablet Take 1 tablet by mouth daily.   Yes [provider]  polyethylene glycol powder (GLYCOLAX/MIRALAX) powder Take 17 g by mouth daily. Mixed in water Patient taking differently: Take 17 g by mouth daily as needed  for mild constipation. Mixed in water 03/11/16  Yes Lada, Satira Anis, MD  QUEtiapine Fumarate (SEROQUEL XR) 150 MG 24 hr tablet Take 150 mg by mouth at bedtime.    Yes [provider]  senna-docusate (SENOKOT-S) 8.6-50 MG tablet Take 1 tablet by mouth daily as needed for mild constipation.    Yes [provider]  solifenacin (VESICARE) 10 MG tablet Take 10 mg by mouth daily.    Yes [provider]  tiZANidine (ZANAFLEX) 4 MG tablet Take 1 tablet (4 mg total) by mouth 4 (four) times daily. TAKES FOR MS 10/14/16  Yes Vaughan Basta, MD  topiramate (TOPAMAX) 50 MG tablet Take 1 tablet (50 mg total) by mouth daily. 10/14/16  Yes Vaughan Basta, MD  vitamin C (ASCORBIC ACID) 500 MG tablet Take 500 mg by mouth daily.   Yes [provider]     VITAL SIGNS:  Blood pressure 119/65,  pulse 74, temperature 98.9 F (37.2 C), resp. rate 17, height 5\' 5"  (1.651 m), weight 135.2 kg (298 lb), SpO2 97 %.  PHYSICAL EXAMINATION:  Physical Exam  GENERAL:  66 y.o.-year-old patient lying in the bed with no acute distress.  EYES: Pupils equal, round, reactive to light and accommodation. No scleral icterus. Extraocular muscles intact.  HEENT: Head atraumatic, normocephalic. Oropharynx and nasopharynx clear. No oropharyngeal erythema, moist oral mucosa  NECK:  Supple, no jugular venous distention. No thyroid enlargement, no tenderness.  LUNGS: Normal breath sounds bilaterally, no wheezing, rales, rhonchi. No use of accessory muscles of respiration.  CARDIOVASCULAR: S1, S2 normal. No murmurs, rubs, or gallops.  ABDOMEN: Soft, nontender, nondistended. Bowel sounds present. No organomegaly or mass. Foley catheter in place EXTREMITIES: No pedal edema, cyanosis, or clubbing. + 2 pedal & radial pulses b/l.   NEUROLOGIC: Moves all 4 extremities PSYCHIATRIC: The patient is alert and oriented x 3. Flat affect SKIN: Left leg erythema  LABORATORY PANEL:   CBC  Recent  Labs Lab 01/02/17 1803  WBC 6.4  HGB 11.5*  HCT 32.5*  PLT 207   ------------------------------------------------------------------------------------------------------------------  Chemistries   Recent Labs Lab 01/02/17 1803  NA 126*  K 3.9  CL 93*  CO2 22  GLUCOSE 90  BUN 15  CREATININE 1.15*  CALCIUM 9.6  AST 25  ALT 21  ALKPHOS 73  BILITOT 0.8   ------------------------------------------------------------------------------------------------------------------  Cardiac Enzymes No results for input(s): TROPONINI in the last 168 hours. ------------------------------------------------------------------------------------------------------------------  RADIOLOGY:  No results found.   IMPRESSION AND PLAN:   * Catheter related Pseudomonas UTI Patient is symptomatic with weakness. We'll start IV Zosyn. This should also cover the cellulitis on her leg. It is unclear if this is true infection or colonization. We'll consult infectious disease. Interaction  with ciprofloxacin and ties and edema patient takes at home limits use.  * Left leg cellulitis. Was on Keflex at home. Now on IV Zosyn. Monitor.  * Hyponatremia. Patient has had poor appetite. Likely due to low solute load. Also was started on hydrochlorothiazide. Stop HCTZ.  * Generalized weakness. Likely due to her lymphoma and ongoing chemotherapy and radiation. Also has UTI. Consult physical therapy. Family wants patient to go to rehabilitation.  * Multiple sclerosis. Continue home medications were symptomatically treatment.  * DVT prophylaxis with Lovenox    All the records are reviewed and case discussed with ED provider. Management plans discussed with the patient, family and they are in agreement.  CODE STATUS: FULL CODE  TOTAL TIME TAKING CARE OF THIS PATIENT: 40 minutes.   [name] R M.D on 01/02/2017 at 7:04 PM  Between 7am to 6pm - Pager - [phone]  After 6pm go to [email or web address] -  password [email or web address] [facility]  Office  [phone]  CC: Primary care physician; Arnetha Courser, MD  Note: This dictation was prepared with Dragon dictation along with smaller phrase technology. Any transcriptional errors that result from this process are unintentional.

## 2017-01-02 NOTE — ED Notes (Signed)
Called pharmacy to inform them we need antibiotics sent up for patient.

## 2017-01-02 NOTE — Telephone Encounter (Signed)
I tried calling pt this am and she has not voicemail and then I tried calling her husband and left mess. Did not hear back from them.  We got a call through triage and it was [name] and she was not on list to speak to.  I then called pt again and she answered the phone and I told her that I had been trying to call her today but no answer and no voicemail.  I told her that [name] had called triage desk but we did not have permission to speak with her and Patient said you do now. I am giving permission to talk to her any time.  I told her that Dr. Janese Banks reviewed her urine cult and the atb she is on will not take care of her psuedomonas.  Dr. Janese Banks is willing to put her on Cipro and then we can see her in a few days if atb not working.  I went to enter it in there is contraindication with tazanidine and cipro.  I called Dr Janese Banks and she looked it up and there is no way to give them together and she would have to come off the tizanidine in order to give it and she takes it for MS and when I spoke to pt there is no way for her to go off it for a week.  The other atb options are IV atb.  I spoke with [name] and she says that she is also haivng belly and bladder pain, legs are weak and she has mild Ha. I called Dr. Janese Banks and spoke with her and at first we were going to call pt's neurologist but the interaction of the two drugs can't be given together so we no longer need to call neurologist.  Dr. Janese Banks states that pt has several medical dx and with the contraindication of med and all other options are IV she needs to go to ER and get admitted for IVATB.  I told this to [place] and she states that she will take her to ER and I told her they can look at note in computer about the results.

## 2017-01-02 NOTE — ED Provider Notes (Signed)
[facility] Emergency Department Provider Note  ____________________________________________  Time seen: Approximately 6:33 PM  I have reviewed the triage vital signs and the nursing notes.   HISTORY  Chief Complaint Urinary Tract Infection   HPI [name] is a 66 y.o. female with a history of MS and chronic indwelling Foley catheter, follicular lymphoma, peripheral vascular disease, hypertension who presents for evaluation of a Pseudomonas UTI and worsening cellulitis. Patient was discharged from the hospital 3 days ago. Her urine culture is growing Pseudomonas. She had her Foley catheter changed during that admission. Unfortunately she cannot be put on Cipro due to interactions with her tizanidine which she is on for her MS. She was sent here by Dr. Janese Banks, her oncologist for IV antibiotics. Patient reports that she feels worse since being discharged from the hospital. She is complaining of worsening cellulitis of her right lower extremity with worsening and extending edema, moderate constant throbbing pain. She has had nausea but no vomiting. No fever or chills. She is also complaining of mild suprapubic cramping and feels like her Foley catheter is not in the correct place. No flank pain.  Past Medical History:  Diagnosis Date  . Abdominal aortic atherosclerosis ([place]) 11/11/2016  . ADHD   . Anxiety   . COPD (chronic obstructive pulmonary disease) ([place])   . Depression    major depressive  . Dyspnea    doe  . Edema    left leg  . Follicular lymphoma ([place])    B Cell  . Follicular lymphoma grade II ([place])   . Hypertension   . Hypotension    idiopathic  . Kyphoscoliosis and scoliosis 11/26/2011  . Morbid obesity ([place]) 01/05/2016  . Multiple sclerosis ([place])   . Multiple sclerosis ([place])    1980's  . Neuromuscular disorder ([place])   . Obstructive and reflux uropathy    foley  . Pain    atypical facial  . Peripheral vascular disease of lower extremity  with ulceration ([place]) 11/08/2015  . Skin ulcer ([place]) 11/08/2015  . Weakness    generalized. has MS    Patient Active Problem List   Diagnosis Date Noted  . Cellulitis and abscess of leg 12/29/2016  . Left ovarian cyst 12/09/2016  . Abdominal aortic atherosclerosis ([place]) 11/11/2016  . Cellulitis of left lower extremity 10/11/2016  . Swelling of lower extremity 10/11/2016  . Obstructive sleep apnea 10/11/2016  . Bilateral lower leg cellulitis 10/11/2016  . Follicular lymphoma of intra-abdominal lymph nodes ([place]) 08/06/2016  . Multiple falls 06/07/2016  . Inguinal adenopathy 05/29/2016  . Breast cancer screening 01/05/2016  . Morbid obesity ([name]) 01/05/2016  . Pressure ulcer 12/19/2015  . Low HDL (under 40) 12/19/2015  . Cellulitis 12/18/2015  . Skin ulcer ([place]) 11/08/2015  . Peripheral vascular disease of lower extremity with ulceration ([place]) 11/08/2015  . CKD (chronic kidney disease) stage 3, GFR 30-59 ml/min 11/08/2015  . Decubitus ulcer of left thigh, stage 2 04/18/2015  . Constipation due to pain medication 01/31/2015  . Obstructive sleep apnea of adult 01/13/2015  . Pelvic muscle wasting 01/13/2015  . Incomplete bladder emptying 01/13/2015  . Headache, migraine 10/24/2014  . 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 Current tobacco use 10/24/2014  . Lumbar radiculopathy, chronic 10/02/2013  . COPD with bronchial hyperresponsiveness ([place]) 10/02/2013  . Major depressive disorder, recurrent, in partial remission ([place]) 10/02/2013  . Hypertension goal BP (blood pressure) < 140/90 10/02/2013  . Multiple sclerosis ([place]) 10/02/2013  . Absence of bladder continence  09/25/2012  . Acontractile bladder 02/12/2012  . Bladder retention 02/12/2012  . Narrowing of intervertebral disc space 11/26/2011  . Kyphoscoliosis and scoliosis 11/26/2011    Past Surgical History:  Procedure Laterality Date  . BACK SURGERY N/A 2002  . CYST EXCISION     lower back  . INGUINAL LYMPH NODE BIOPSY Left  07/04/2016   Procedure: INGUINAL LYMPH NODE BIOPSY;  Surgeon: Christene Lye, MD;  Location: [facility] ORS;  Service: General;  Laterality: Left;  . PORTACATH PLACEMENT N/A 07/22/2016   Procedure: INSERTION PORT-A-CATH;  Surgeon: Christene Lye, MD;  Location: [facility] ORS;  Service: General;  Laterality: N/A;  . TONSILLECTOMY AND ADENOIDECTOMY    . TUBAL LIGATION      Prior to Admission medications   Medication Sig Start Date End Date Taking? Authorizing Provider  AVONEX PEN 30 MCG/0.5ML AJKT Inject 30 mcg into the skin every Monday. 04/28/16   [provider]  buPROPion (WELLBUTRIN XL) 300 MG 24 hr tablet Take 300 mg by mouth every morning.  05/28/16   [provider]  cephALEXin (KEFLEX) 500 MG capsule Take 1 capsule (500 mg total) by mouth 3 (three) times daily. 12/30/16 01/07/17  Bettey Costa, MD  clonazePAM (KLONOPIN) 1 MG tablet Take 1 tablet (1 mg total) by mouth at bedtime. 10/14/16   Vaughan Basta, MD  DULoxetine (CYMBALTA) 60 MG capsule Take 1 capsule (60 mg total) by mouth every morning. 10/14/16   Vaughan Basta, MD  fluticasone furoate-vilanterol (BREO ELLIPTA) 100-25 MCG/INH AEPB Inhale 1 puff into the lungs daily. 01/05/16   Arnetha Courser, MD  gabapentin (NEURONTIN) 600 MG tablet Take 0.5 tablets (300 mg total) by mouth 3 (three) times daily. 12/30/16   Bettey Costa, MD  lidocaine-prilocaine (EMLA) cream Apply 1 application topically as needed. apply cream over the port site and then place small piece of saran wrap over the cream to protect your clothing 11/07/16   Sindy Guadeloupe, MD  lisinopril (PRINIVIL,ZESTRIL) 5 MG tablet Take 1 tablet (5 mg total) by mouth daily. 11/13/16   Arnetha Courser, MD  methylphenidate (RITALIN) 10 MG tablet Take 1 tablet (10 mg total) by mouth 2 (two) times daily. 10/14/16   Vaughan Basta, MD  Multiple Vitamin (MULTIVITAMIN WITH MINERALS) TABS tablet Take 1 tablet by mouth daily.    [provider]    polyethylene glycol powder (GLYCOLAX/MIRALAX) powder Take 17 g by mouth daily. Mixed in water 03/11/16   Lada, Satira Anis, MD  QUEtiapine Fumarate (SEROQUEL XR) 150 MG 24 hr tablet 150 mg at bedtime.  08/14/16   [provider]  senna-docusate (SENOKOT-S) 8.6-50 MG tablet Take 1 tablet by mouth daily.    [provider]  solifenacin (VESICARE) 10 MG tablet Take 10 mg by mouth daily.     [provider]  tiZANidine (ZANAFLEX) 4 MG tablet Take 1 tablet (4 mg total) by mouth 4 (four) times daily. TAKES FOR MS 10/14/16   Vaughan Basta, MD  topiramate (TOPAMAX) 50 MG tablet Take 1 tablet (50 mg total) by mouth daily. 10/14/16   Vaughan Basta, MD  vitamin C (ASCORBIC ACID) 500 MG tablet Take 500 mg by mouth daily.    [provider]    Allergies Patient has no known allergies.  Family History  Problem Relation Age of Onset  . COPD Mother   . Diabetes Mother   . Heart failure Mother   . Alcohol abuse Father   . Kidney disease Father   .  Kidney failure Father   . Arthritis Sister   . CAD Maternal Grandmother   . Stroke Maternal Grandfather   . Arthritis Sister   . Mental illness Sister   . Arthritis Brother     Social History Social History  Substance Use Topics  . Smoking status: Former Smoker    Packs/day: 1.00    Years: 20.00    Types: Cigarettes    Quit date: 02/03/2016  . Smokeless tobacco: Never Used  . Alcohol use No    Review of Systems  Constitutional: Negative for fever. Eyes: Negative for visual changes. ENT: Negative for sore throat. Neck: No neck pain  Cardiovascular: Negative for chest pain. Respiratory: Negative for shortness of breath. Gastrointestinal: + suprapubic abdominal pain, nausea. No vomiting or diarrhea. Genitourinary: + dysuria. Musculoskeletal: Negative for back pain. Skin: Negative for rash. + worsening cellulitis Neurological: Negative for headaches, weakness or numbness. Psych: No SI or  HI  ____________________________________________   PHYSICAL EXAM:  VITAL SIGNS: ED Triage Vitals [01/02/17 1759]  Enc Vitals Group     BP 119/65     Pulse Rate 74     Resp 17     Temp 98.9 F (37.2 C)     Temp src      SpO2 97 %     Weight 298 lb (135.2 kg)     Height 5\' 5"  (1.651 m)     Head Circumference      [facility]      Pain Score      Pain Loc      Pain Edu?      Excl. in [place]?     Constitutional: Alert and oriented. Well appearing and in no apparent distress. HEENT:      Head: Normocephalic and atraumatic.         Eyes: Conjunctivae are normal. Sclera is non-icteric.       Mouth/Throat: Mucous membranes are moist.       Neck: Supple with no signs of meningismus. Cardiovascular: Regular rate and rhythm. No murmurs, gallops, or rubs. 2+ symmetrical distal pulses are present in all extremities. No JVD. Respiratory: Normal respiratory effort. Lungs are clear to auscultation bilaterally. No wheezes, crackles, or rhonchi.  Gastrointestinal: Soft, non tender, and non distended with positive bowel sounds. No rebound or guarding. Musculoskeletal: right lower extremity is swollen, tender, with erythema and warmth. Erythema is extending past demarcated line Neurologic: Normal speech and language. Face is symmetric. Moving all extremities. No gross focal neurologic deficits are appreciated. Skin: Skin is warm, dry and intact. No rash noted. Psychiatric: Mood and affect are normal. Speech and behavior are normal.  ____________________________________________   LABS (all labs ordered are listed, but only abnormal results are displayed)  Labs Reviewed  COMPREHENSIVE METABOLIC PANEL - Abnormal; Notable for the following:       Result Value   Sodium 126 (*)    Chloride 93 (*)    Creatinine, Ser 1.15 (*)    GFR calc non Af Amer 48 (*)    GFR calc Af Amer 56 (*)    All other components within normal limits  CBC WITH DIFFERENTIAL/PLATELET - Abnormal; Notable for the  following:    Hemoglobin 11.5 (*)    HCT 32.5 (*)    RDW 15.2 (*)    Lymphs Abs 0.2 (*)    All other components within normal limits  CULTURE, BLOOD (ROUTINE X 2)  CULTURE, BLOOD (ROUTINE X 2)  URINALYSIS, COMPLETE (UACMP) WITH MICROSCOPIC  LACTIC ACID, PLASMA   ____________________________________________  EKG  none  ____________________________________________  RADIOLOGY  none  ____________________________________________   PROCEDURES  Procedure(s) performed: None Procedures Critical Care performed:  None ____________________________________________   INITIAL IMPRESSION / ASSESSMENT AND PLAN / ED COURSE   66 y.o. female with a history of MS and chronic indwelling Foley catheter, follicular lymphoma, peripheral vascular disease, hypertension who presents for evaluation of a Pseudomonas UTI and worsening cellulitis.patient does not meet sepsis criteria at this time. She does seem to have worsening cellulitis. She cannot be put on Cipro due to medication interactions. I'll give her a dose of Zosyn, we'll get basic blood work, lactate and blood cultures. We'll admit to the hospitalist service.      Pertinent labs & imaging results that were available during my care of the patient were reviewed by me and considered in my medical decision making (see chart for details).    ____________________________________________   FINAL CLINICAL IMPRESSION(S) / ED DIAGNOSES  Final diagnoses:  Complicated UTI (urinary tract infection)  Cellulitis of right lower extremity      NEW MEDICATIONS STARTED DURING THIS VISIT:  New Prescriptions   No medications on file     Note:  This document was prepared using Dragon voice recognition software and may include unintentional dictation errors.    Alfred Levins, [place], MD 01/02/17 [phone]

## 2017-01-03 ENCOUNTER — Ambulatory Visit: Payer: Medicare Other

## 2017-01-03 ENCOUNTER — Ambulatory Visit: Payer: Self-pay | Admitting: *Deleted

## 2017-01-03 LAB — BASIC METABOLIC PANEL
Anion gap: 7 (ref 5–15)
BUN: 15 mg/dL (ref 6–20)
CO2: 24 mmol/L (ref 22–32)
Calcium: 8.4 mg/dL — ABNORMAL LOW (ref 8.9–10.3)
Chloride: 98 mmol/L — ABNORMAL LOW (ref 101–111)
Creatinine, Ser: 1.07 mg/dL — ABNORMAL HIGH (ref 0.44–1.00)
GFR calc Af Amer: >60 mL/min (ref >60)
GFR calc non Af Amer: 53 mL/min — ABNORMAL LOW (ref >60)
Glucose, Bld: 109 mg/dL — ABNORMAL HIGH (ref 65–99)
Potassium: 3.3 mmol/L — ABNORMAL LOW (ref 3.5–5.1)
Sodium: 129 mmol/L — ABNORMAL LOW (ref 135–145)

## 2017-01-03 LAB — CBC
HCT: 26.5 % — ABNORMAL LOW (ref 35.0–47.0)
Hemoglobin: 9.5 g/dL — ABNORMAL LOW (ref 12.0–16.0)
MCH: 29.8 pg (ref 26.0–34.0)
MCHC: 35.7 g/dL (ref 32.0–36.0)
MCV: 83.4 fL (ref 80.0–100.0)
Platelets: 160 10*3/uL (ref 150–440)
RBC: 3.17 MIL/uL — ABNORMAL LOW (ref 3.80–5.20)
RDW: 15.3 % — ABNORMAL HIGH (ref 11.5–14.5)
WBC: 3.8 10*3/uL (ref 3.6–11.0)

## 2017-01-03 MED ORDER — POTASSIUM CHLORIDE CRYS ER 20 MEQ PO TBCR
20.0000 meq | EXTENDED_RELEASE_TABLET | Freq: Once | ORAL | Status: AC
  Administered 2017-01-03: 20 meq via ORAL
  Filled 2017-01-03: qty 1

## 2017-01-03 MED ORDER — GABAPENTIN 300 MG PO CAPS
300.0000 mg | ORAL_CAPSULE | Freq: Three times a day (TID) | ORAL | Status: DC
  Administered 2017-01-03 – 2017-01-06 (×8): 300 mg via ORAL
  Filled 2017-01-03 (×8): qty 1

## 2017-01-03 MED ORDER — ENOXAPARIN SODIUM 40 MG/0.4ML ~~LOC~~ SOLN
40.0000 mg | Freq: Two times a day (BID) | SUBCUTANEOUS | Status: DC
  Administered 2017-01-03 – 2017-01-06 (×6): 40 mg via SUBCUTANEOUS
  Filled 2017-01-03 (×6): qty 0.4
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
## 2017-01-03 NOTE — Progress Notes (Signed)
[place] [place] NAME: [name]    MR#:  [id number]  DATE OF BIRTH:  1950/12/02  SUBJECTIVE:  CHIEF COMPLAINT:   Chief Complaint  Patient presents with  . Urinary Tract Infection  Feels weak, has multiple questions which were answered, husband at bedside  REVIEW OF SYSTEMS:  Review of Systems  Constitutional: Positive for malaise/fatigue. Negative for chills, fever and weight loss.  HENT: Negative for nosebleeds and sore throat.   Eyes: Negative for blurred vision.  Respiratory: Negative for cough, shortness of breath and wheezing.   Cardiovascular: Negative for chest pain, orthopnea, leg swelling and PND.  Gastrointestinal: Negative for abdominal pain, constipation, diarrhea, heartburn, nausea and vomiting.  Genitourinary: Negative for dysuria and urgency.  Musculoskeletal: Negative for back pain.  Skin: Negative for rash.  Neurological: Positive for weakness. Negative for dizziness, speech change, focal weakness and headaches.  Endo/Heme/Allergies: Does not bruise/bleed easily.  Psychiatric/Behavioral: Negative for depression.    DRUG ALLERGIES:  No Known Allergies VITALS:  Blood pressure (!) 110/52, pulse 65, temperature 98 F (36.7 C), temperature source Oral, resp. rate 16, height 5\' 5"  (1.651 m), weight (!) 137.7 kg (303 lb 9.6 oz), SpO2 100 %. PHYSICAL EXAMINATION:  Physical Exam  Constitutional: She is oriented to person, place, and time and well-developed, well-nourished, and in no distress.  HENT:  Head: Normocephalic and atraumatic.  Eyes: Pupils are equal, round, and reactive to light. Conjunctivae and EOM are normal.  Neck: Normal range of motion. Neck supple. No tracheal deviation present. No thyromegaly present.  Cardiovascular: Normal rate, regular rhythm and normal heart sounds.   Pulmonary/Chest: Effort normal and breath sounds normal. No respiratory distress. She has no wheezes. She exhibits no tenderness.   Abdominal: Soft. Bowel sounds are normal. She exhibits no distension. There is no tenderness.  Musculoskeletal: Normal range of motion.  Neurological: She is alert and oriented to person, place, and time. No cranial nerve deficit.  Skin: Skin is warm and dry. No rash noted.  Psychiatric: Mood and affect normal.   LABORATORY PANEL:  Female CBC  Recent Labs Lab 01/03/17 0409  WBC 3.8  HGB 9.5*  HCT 26.5*  PLT 160   ------------------------------------------------------------------------------------------------------------------ Chemistries   Recent Labs Lab 01/02/17 1803 01/03/17 0409  NA 126* 129*  K 3.9 3.3*  CL 93* 98*  CO2 22 24  GLUCOSE 90 109*  BUN 15 15  CREATININE 1.15* 1.07*  CALCIUM 9.6 8.4*  AST 25  --   ALT 21  --   ALKPHOS 73  --   BILITOT 0.8  --    RADIOLOGY:  No results found. ASSESSMENT AND PLAN:  66 y.o. female with a known history of Multiple sclerosis, muscle spasms, chronic Foley catheter, hypertension who was recently in the hospital for UTI and left flexor tinnitus returns due to her oncologist Dr. Janese Banks calling her that cultures show pseudomonas in the urine.  * Catheter related Pseudomonas UTI - continue IV Zosyn. This should also cover the cellulitis on her leg. It is unclear if this is true infection or colonization. - Await infectious disease input Interaction  with ciprofloxacin and ties and edema patient takes at home limits use.  * Left leg cellulitis - continue IV Zosyn. Monitor.  * Hyponatremia: Likely due to low solute load.  - HCTZ stopped -Sodium improved from 126-?129 with IV fluids - monitor  *  Hypokalemia -Replete and recheck   * Generalized weakness. Likely due to  her lymphoma and ongoing chemotherapy and radiation. Also has UTI. - physical therapy recommends rehabilitation.  Social worker aware and working on it  * Multiple sclerosis. Continue home medications   * DVT prophylaxis with Lovenox     All the  records are reviewed and case discussed with Care Management/Social Worker. Management plans discussed with the patient, family (husband at bedside) and they are in agreement.  CODE STATUS: Full Code  TOTAL TIME TAKING CARE OF THIS PATIENT: 30 minutes.   More than 50% of the time was spent in counseling/coordination of care: YES  POSSIBLE D/C IN 1-2 DAYS, DEPENDING ON CLINICAL CONDITION. & Placement   Max Sane M.D on 01/03/2017 at 3:21 PM  Between 7am to 6pm - Pager - [phone]  After 6pm go to [email or web address] - Proofreader  [facility]  Office  [phone]  CC: Primary care physician; Arnetha Courser, MD  Note: This dictation was prepared with Dragon dictation along with smaller phrase technology. Any transcriptional errors that result from this process are unintentional.

## 2017-01-03 NOTE — Progress Notes (Signed)
Pharmacy Antibiotic Note  [name] is a 66 y.o. female admitted on 01/02/2017 with Pseudomonal urinary tract infection and cellulitis .  Pharmacy has been consulted for Zosyn dosing.  Plan: Will continue Zosyn 3.375 g IV q8 hours  Height: 5\' 5"  (165.1 cm) Weight: (!) 303 lb 9.6 oz (137.7 kg) IBW/kg (Calculated) : 57  Temp (24hrs), Avg:98.6 F (37 C), Min:98 F (36.7 C), Max:98.9 F (37.2 C)   Recent Labs Lab 12/29/16 0829 12/30/16 0256 01/02/17 1803 01/02/17 1901 01/03/17 0409  WBC 6.5 5.5 6.4  --  3.8  CREATININE 1.42* 1.22* 1.15*  --  1.07*  LATICACIDVEN 1.7  --   --  1.2  --     Estimated Creatinine Clearance: 72.9 mL/min (A) (by C-G formula based on SCr of 1.07 mg/dL (H)).    No Known Allergies  Antimicrobials this admission: Zosyn  9/6  >>   Dose adjustments this admission:   Microbiology results: 9/6 BCx: NGTD   Thank you for allowing pharmacy to be a part of this patient's care.  Ulice Dash D 01/03/2017 1:16 PM

## 2017-01-03 NOTE — Evaluation (Addendum)
Physical Therapy Evaluation Patient Details Name: [name] MRN: [id number] DOB: 1950/10/31 Today's Date: 01/03/2017   History of Present Illness  [name]  is a 66 y.o. female with a known history of multiple sclerosis, muscle spasms, chronic Foley catheter, hypertension who was recently in the hospital for UTI returns due to her oncologist Dr. Janese Banks calling her that cultures show pseudomonas in the urine. Wanted to start her on ciprofloxacin but due to interaction with nizatidine patient was asked to come to the emergency room. Also family very frustrated that patient was in the hospital only for one day and discharged. Redness in the left leg still persistent. Patient is afebrile and has normal WBC. She is now admitted for catheter related UTI, LLE cellulitis, hyponatremia, and generalized weakness. Family would like her to be placed in SNF.   Clinical Impression  Pt admitted with above diagnosis. Pt currently with functional limitations due to the deficits listed below (see PT Problem List). Pt is very weak during evaluation. She requires modA for bed mobility and maxA+2 for sit to stand transfers. Once upright pt requires heavy UE support in standing on walker and is unsafe/unable to attempt ambulation. Pt is very irritable throughout session and demonstrates poor motivation with therapy. She has had multiple SNF placements but tends to go home and become deconditioned again. Nevertheless she still requires SNF placement at this time. Until relatively recently family and pt report she was walking limited distances in the house. She is unable to ambulate at this time with therapy. Recommend SNF placement in order to improve strength and mobility. It appears that ultimately pt will need some sort of LTC placement as she already requires 24/7 care from family/friends. Pt will benefit from PT services to address deficits in strength, balance, and mobility in order to return to full function at home.      Follow Up Recommendations SNF    Equipment Recommendations  None recommended by PT    Recommendations for Other Services       Precautions / Restrictions Precautions Precautions: Fall Restrictions Weight Bearing Restrictions: No      Mobility  Bed Mobility Overal bed mobility: Needs Assistance Bed Mobility: Supine to Sit     Supine to sit: Mod assist     General bed mobility comments: Pt requires modA+1 to roll onto left side and to push up to sitting. Once upright she does not require assist to balance  Transfers Overall transfer level: Needs assistance Equipment used: Rolling walker (2 wheeled) (bariatric) Transfers: Sit to/from Stand Sit to Stand: Max assist;+2 physical assistance         General transfer comment: Pt requires maxA+2 for sit to stand transfer. Multiple attempts required. Once upright pt is unsteady/unstable in standing. Unsafe/unable to attempt ambulation at this time. While standing pt has BM and is returned to bed for RN students to clean  Ambulation/Gait             General Gait Details: Unable/unsafe to attempt  Stairs            Wheelchair Mobility    Modified Rankin (Stroke Patients Only)       Balance Overall balance assessment: Needs assistance Sitting-balance support: No upper extremity supported Sitting balance-Leahy Scale: Fair     Standing balance support: Bilateral upper extremity supported Standing balance-Leahy Scale: Poor  Pertinent Vitals/Pain Pain Assessment: No/denies pain    Home Living Family/patient expects to be discharged to:: Private residence Living Arrangements: Spouse/significant other Available Help at Discharge: Family Type of Home: House Home Access: [place]: One [place]: Environmental consultant - 4 wheels;Bedside commode;Other (comment);Shower seat Technical brewer)      Prior Function Level of Independence: Needs  assistance   Gait / Transfers Assistance Needed: Rollator for ambulation for very limited household ambulation. Very sedentary at baseline. Family reports at least 19 times that EMS has had to come get pt off floor due to falls and inability to stand  ADL's / Homemaking Assistance Needed: Requires assist for all ADLs/IADLs        Hand Dominance   Dominant Hand: Right    Extremity/Trunk Assessment   Upper Extremity Assessment Upper Extremity Assessment: Generalized weakness    Lower Extremity Assessment Lower Extremity Assessment: Generalized weakness       Communication   Communication: No difficulties  Cognition Arousal/Alertness: Lethargic Behavior During Therapy: Flat affect;Agitated Overall Cognitive Status: Within Functional Limits for tasks assessed                                 General Comments: Very lethargic but also irritable during session      General Comments      Exercises     Assessment/Plan    PT Assessment Patient needs continued PT services  PT Problem List Decreased strength;Decreased activity tolerance;Decreased balance;Decreased mobility;Decreased safety awareness;Obesity       PT Treatment Interventions DME instruction;Gait training;Functional mobility training;Therapeutic activities;Therapeutic exercise;Balance training;Neuromuscular re-education;Cognitive remediation;Wheelchair mobility training    PT Goals (Current goals can be found in the Care Plan section)  Acute Rehab PT Goals Patient Stated Goal: Improve strength PT Goal Formulation: With patient/family Time For Goal Achievement: 01/17/17 Potential to Achieve Goals: Fair    Frequency Min 2X/week   Barriers to discharge Other (comment) (Pt requiring 24/7 care at this point)      Co-evaluation               AM-PAC PT "6 Clicks" Daily Activity  Outcome Measure Difficulty turning over in bed (including adjusting bedclothes, sheets and blankets)?:  Unable Difficulty moving from lying on back to sitting on the side of the bed? : Unable Difficulty sitting down on and standing up from a chair with arms (e.g., wheelchair, bedside commode, etc,.)?: Unable Help needed moving to and from a bed to chair (including a wheelchair)?: A Lot Help needed walking in hospital room?: Total Help needed climbing 3-5 steps with a railing? : Total 6 Click Score: 7    End of Session Equipment Utilized During Treatment: Gait belt Activity Tolerance: Patient tolerated treatment well Patient left: in bed;with call bell/phone within reach;with family/visitor present;with nursing/sitter in room Nurse Communication: Mobility status;Other (comment) (CSW notified of DC recommendation) PT Visit Diagnosis: Unsteadiness on feet (R26.81);Repeated falls (R29.6);Muscle weakness (generalized) (M62.81)    Time: 1093-2355 PT Time Calculation (min) (ACUTE ONLY): 25 min   Charges:   PT Evaluation $PT Eval Moderate Complexity: 1 Mod     PT G Codes:   PT G-Codes **NOT FOR INPATIENT CLASS** Functional Assessment Tool Used: AM-PAC 6 Clicks Basic Mobility Functional Limitation: Mobility: Walking and moving around Mobility: Walking and Moving Around Current Status (D3220): At least 80 percent but less than 100 percent impaired, limited or restricted Mobility: Walking and Moving  Around Goal Status [phone]): At least 60 percent but less than 80 percent impaired, limited or restricted    Phillips Grout PT, DPT    Huprich,Jason 01/03/2017, 11:01 AM

## 2017-01-03 NOTE — Consult Note (Signed)
[facility] Infectious Disease     Reason for Consult:  UTI, Cellulitis Referring Physician: Boykin Reaper  Date of Admission:  01/02/2017   Active Problems:   UTI (urinary tract infection)   HPI: [name] is a 66 y.o. female admitted with leg redness, pain and swelling and UTI.  She has a hx of MS, Follicular B cell lymphoma, obesity, sleep apnea, chronic foley cath, HTN. Recent admission for the cellulitis and pain in leg. She had foley cath exchanged at that time.  Treated with ceftriaxone and improved. Dced 9/3 on keflex for LE Cellulitis and UTI but Ucx came back + and had Pseudomonas.   Cannot take cipro due to drug interactions.  On 9/5 UA with TNTC WBC but on repeat this admission has only 6-30.  She has nml wbc and has had no fevers. Currently leg less swollen, no longer weeping.   Past Medical History:  Diagnosis Date  . Abdominal aortic atherosclerosis ([place]) 11/11/2016  . ADHD   . Anxiety   . COPD (chronic obstructive pulmonary disease) ([place])   . Depression    major depressive  . Dyspnea    doe  . Edema    left leg  . Follicular lymphoma ([place])    B Cell  . Follicular lymphoma grade II ([place])   . Hypertension   . Hypotension    idiopathic  . Kyphoscoliosis and scoliosis 11/26/2011  . Morbid obesity ([place]) 01/05/2016  . Multiple sclerosis ([place])   . Multiple sclerosis ([place])    1980's  . Neuromuscular disorder ([place])   . Obstructive and reflux uropathy    foley  . Pain    atypical facial  . Peripheral vascular disease of lower extremity with ulceration ([place]) 11/08/2015  . Skin ulcer ([place]) 11/08/2015  . Weakness    generalized. has MS   Past Surgical History:  Procedure Laterality Date  . BACK SURGERY N/A 2002  . CYST EXCISION     lower back  . INGUINAL LYMPH NODE BIOPSY Left 07/04/2016   Procedure: INGUINAL LYMPH NODE BIOPSY;  Surgeon: Christene Lye, MD;  Location: [facility] ORS;  Service: General;  Laterality: Left;  . PORTACATH PLACEMENT N/A 07/22/2016    Procedure: INSERTION PORT-A-CATH;  Surgeon: Christene Lye, MD;  Location: [facility] ORS;  Service: General;  Laterality: N/A;  . TONSILLECTOMY AND ADENOIDECTOMY    . TUBAL LIGATION     Social History  Substance Use Topics  . Smoking status: Former Smoker    Packs/day: 1.00    Years: 20.00    Types: Cigarettes    Quit date: 02/03/2016  . Smokeless tobacco: Never Used  . Alcohol use No   Family History  Problem Relation Age of Onset  . COPD Mother   . Diabetes Mother   . Heart failure Mother   . Alcohol abuse Father   . Kidney disease Father   . Kidney failure Father   . Arthritis Sister   . CAD Maternal Grandmother   . Stroke Maternal Grandfather   . Arthritis Sister   . Mental illness Sister   . Arthritis Brother     Allergies: No Known Allergies  Current antibiotics: Antibiotics Given (last 72 hours)    Date/Time Action Medication Dose Rate   01/02/17 1956 New Bag/Given   piperacillin-tazobactam (ZOSYN) IVPB 3.375 g 3.375 g 100 mL/hr   01/03/17 0156 New Bag/Given   piperacillin-tazobactam (ZOSYN) IVPB 3.375 g 3.375 g 12.5 mL/hr   01/03/17 1010 New Bag/Given  piperacillin-tazobactam (ZOSYN) IVPB 3.375 g 3.375 g 12.5 mL/hr      MEDICATIONS: . buPROPion  300 mg Oral BH-q7a  . clonazePAM  1 mg Oral QHS  . darifenacin  7.5 mg Oral Daily  . DULoxetine  60 mg Oral BH-q7a  . enoxaparin (LOVENOX) injection  40 mg Subcutaneous BID  . fluticasone furoate-vilanterol  1 puff Inhalation Daily  . gabapentin  300 mg Oral TID  . methylphenidate  10 mg Oral BID  . potassium chloride  20 mEq Oral Once  . QUEtiapine Fumarate  150 mg Oral QHS  . senna-docusate  1 tablet Oral BID  . tiZANidine  4 mg Oral QID  . topiramate  50 mg Oral Daily    Review of Systems - 11 systems reviewed and negative per HPI   OBJECTIVE: Temp:  [98 F (36.7 C)-98.9 F (37.2 C)] 98 F (36.7 C) (09/07 1231) Pulse Rate:  [65-80] 65 (09/07 1231) Resp:  [16-20] 16 (09/07 1231) BP:  (110-156)/(46-65) 110/52 (09/07 1231) SpO2:  [95 %-100 %] 100 % (09/07 1231) Weight:  [135.2 kg (298 lb)-137.8 kg (303 lb 11.2 oz)] 137.7 kg (303 lb 9.6 oz) (09/07 0500) Physical Exam  Constitutional:  oriented to person, place, and time. appears well-developed and well-nourished. No distress. obese HENT: [place]/AT, PERRLA, no scleral icterus Mouth/Throat: Oropharynx is clear and dry. No oropharyngeal exudate.  Cardiovascular: Normal rate, regular rhythm and normal heart sounds. Pulmonary/Chest: Effort normal and breath sounds normal. No respiratory distress.  has no wheezes.  Neck = supple, no nuchal rigidity Abdominal: Soft. Bowel sounds are normal.  exhibits no distension. There is no tenderness.  Lymphadenopathy: no cervical adenopathy. No axillary adenopathy Neurological: alert and oriented to person, place, and time.  Ext 1+ edema bil LE Skin: L leg with mild redness and warmth, has a 1 cm abrasion laterally which is dry Psychiatric: a normal mood and affect.  behavior is normal.    LABS: Results for orders placed or performed during the hospital encounter of 01/02/17 (from the past 48 hour(s))  Comprehensive metabolic panel     Status: Abnormal   Collection Time: 01/02/17  6:03 PM  Result Value Ref Range   Sodium 126 (L) 135 - 145 mmol/L   Potassium 3.9 3.5 - 5.1 mmol/L   Chloride 93 (L) 101 - 111 mmol/L   CO2 22 22 - 32 mmol/L   Glucose, Bld 90 65 - 99 mg/dL   BUN 15 6 - 20 mg/dL   Creatinine, Ser 1.15 (H) 0.44 - 1.00 mg/dL   Calcium 9.6 8.9 - 10.3 mg/dL   Total Protein 8.0 6.5 - 8.1 g/dL   Albumin 4.1 3.5 - 5.0 g/dL   AST 25 15 - 41 U/L   ALT 21 14 - 54 U/L   Alkaline Phosphatase 73 38 - 126 U/L   Total Bilirubin 0.8 0.3 - 1.2 mg/dL   GFR calc non Af Amer 48 (L) >60 mL/min   GFR calc Af Amer 56 (L) >60 mL/min    Comment: (NOTE) The eGFR has been calculated using the CKD EPI equation. This calculation has not been validated in all clinical situations. eGFR's persistently  <60 mL/min signify possible Chronic Kidney Disease.    Anion gap 11 5 - 15  CBC with Differential     Status: Abnormal   Collection Time: 01/02/17  6:03 PM  Result Value Ref Range   WBC 6.4 3.6 - 11.0 K/uL   RBC 3.91 3.80 - 5.20 MIL/uL  Hemoglobin 11.5 (L) 12.0 - 16.0 g/dL   HCT 32.5 (L) 35.0 - 47.0 %   MCV 83.1 80.0 - 100.0 fL   MCH 29.4 26.0 - 34.0 pg   MCHC 35.3 32.0 - 36.0 g/dL   RDW 15.2 (H) 11.5 - 14.5 %   Platelets 207 150 - 440 K/uL   Neutrophils Relative % 81 %   Neutro Abs 5.1 1.4 - 6.5 K/uL   Lymphocytes Relative 3 %   Lymphs Abs 0.2 (L) 1.0 - 3.6 K/uL   Monocytes Relative 10 %   Monocytes Absolute 0.6 0.2 - 0.9 K/uL   Eosinophils Relative 6 %   Eosinophils Absolute 0.4 0 - 0.7 K/uL   Basophils Relative 0 %   Basophils Absolute 0.0 0 - 0.1 K/uL  Urinalysis, Complete w Microscopic     Status: Abnormal   Collection Time: 01/02/17  7:01 PM  Result Value Ref Range   Color, Urine YELLOW (A) YELLOW   APPearance CLOUDY (A) CLEAR   Specific Gravity, Urine 1.010 1.005 - 1.030   pH 5.0 5.0 - 8.0   Glucose, UA NEGATIVE NEGATIVE mg/dL   Hgb urine dipstick MODERATE (A) NEGATIVE   Bilirubin Urine NEGATIVE NEGATIVE   Ketones, ur NEGATIVE NEGATIVE mg/dL   Protein, ur 100 (A) NEGATIVE mg/dL   Nitrite NEGATIVE NEGATIVE   Leukocytes, UA MODERATE (A) NEGATIVE   RBC / HPF TOO NUMEROUS TO COUNT 0 - 5 RBC/hpf   WBC, UA 6-30 0 - 5 WBC/hpf   Bacteria, UA NONE SEEN NONE SEEN   Squamous Epithelial / LPF 0-5 (A) NONE SEEN   Mucus PRESENT   Lactic acid, plasma     Status: None   Collection Time: 01/02/17  7:01 PM  Result Value Ref Range   Lactic Acid, Venous 1.2 0.5 - 1.9 mmol/L  Blood culture (routine x 2)     Status: None (Preliminary result)   Collection Time: 01/02/17  7:01 PM  Result Value Ref Range   Specimen Description BLOOD BLOOD RIGHT WRIST    Special Requests      BOTTLES DRAWN AEROBIC AND ANAEROBIC Blood Culture adequate volume   Culture NO GROWTH < 12 HOURS     Report Status PENDING   Blood culture (routine x 2)     Status: None (Preliminary result)   Collection Time: 01/02/17  7:01 PM  Result Value Ref Range   Specimen Description BLOOD LEFT ANTECUBITAL    Special Requests      BOTTLES DRAWN AEROBIC AND ANAEROBIC Blood Culture results may not be optimal due to an excessive volume of blood received in culture bottles   Culture NO GROWTH < 12 HOURS    Report Status PENDING   Basic metabolic panel     Status: Abnormal   Collection Time: 01/03/17  4:09 AM  Result Value Ref Range   Sodium 129 (L) 135 - 145 mmol/L   Potassium 3.3 (L) 3.5 - 5.1 mmol/L   Chloride 98 (L) 101 - 111 mmol/L   CO2 24 22 - 32 mmol/L   Glucose, Bld 109 (H) 65 - 99 mg/dL   BUN 15 6 - 20 mg/dL   Creatinine, Ser 1.07 (H) 0.44 - 1.00 mg/dL   Calcium 8.4 (L) 8.9 - 10.3 mg/dL   GFR calc non Af Amer 53 (L) >60 mL/min   GFR calc Af Amer >60 >60 mL/min    Comment: (NOTE) The eGFR has been calculated using the CKD EPI equation. This calculation has not been  validated in all clinical situations. eGFR's persistently <60 mL/min signify possible Chronic Kidney Disease.    Anion gap 7 5 - 15  CBC     Status: Abnormal   Collection Time: 01/03/17  4:09 AM  Result Value Ref Range   WBC 3.8 3.6 - 11.0 K/uL   RBC 3.17 (L) 3.80 - 5.20 MIL/uL   Hemoglobin 9.5 (L) 12.0 - 16.0 g/dL   HCT 26.5 (L) 35.0 - 47.0 %   MCV 83.4 80.0 - 100.0 fL   MCH 29.8 26.0 - 34.0 pg   MCHC 35.7 32.0 - 36.0 g/dL   RDW 15.3 (H) 11.5 - 14.5 %   Platelets 160 150 - 440 K/uL   No components found for: ESR, C REACTIVE PROTEIN MICRO: Recent Results (from the past 720 hour(s))  Urine Culture     Status: Abnormal   Collection Time: 12/29/16  8:29 AM  Result Value Ref Range Status   Specimen Description URINE, RANDOM  Final   Special Requests NONE  Final   Culture >=100,000 COLONIES/mL PSEUDOMONAS AERUGINOSA (A)  Final   Report Status 01/02/2017 FINAL  Final   Organism ID, Bacteria PSEUDOMONAS AERUGINOSA  (A)  Final      Susceptibility   Pseudomonas aeruginosa - MIC*    CEFTAZIDIME 4 SENSITIVE Sensitive     CIPROFLOXACIN <=0.25 SENSITIVE Sensitive     GENTAMICIN 2 SENSITIVE Sensitive     IMIPENEM 2 SENSITIVE Sensitive     PIP/TAZO <=4 SENSITIVE Sensitive     CEFEPIME 2 SENSITIVE Sensitive     * >=100,000 COLONIES/mL PSEUDOMONAS AERUGINOSA  Blood culture (routine x 2)     Status: None (Preliminary result)   Collection Time: 01/02/17  7:01 PM  Result Value Ref Range Status   Specimen Description BLOOD BLOOD RIGHT WRIST  Final   Special Requests   Final    BOTTLES DRAWN AEROBIC AND ANAEROBIC Blood Culture adequate volume   Culture NO GROWTH < 12 HOURS  Final   Report Status PENDING  Incomplete  Blood culture (routine x 2)     Status: None (Preliminary result)   Collection Time: 01/02/17  7:01 PM  Result Value Ref Range Status   Specimen Description BLOOD LEFT ANTECUBITAL  Final   Special Requests   Final    BOTTLES DRAWN AEROBIC AND ANAEROBIC Blood Culture results may not be optimal due to an excessive volume of blood received in culture bottles   Culture NO GROWTH < 12 HOURS  Final   Report Status PENDING  Incomplete    IMAGING: US Transvaginal Non-ob  Result Date: 12/11/2016 CLINICAL DATA:  Left ovarian cyst on CT EXAM: TRANSABDOMINAL AND TRANSVAGINAL ULTRASOUND OF PELVIS TECHNIQUE: Both transabdominal and transvaginal ultrasound examinations of the pelvis were performed. Transabdominal technique was performed for global imaging of the pelvis including uterus, ovaries, adnexal regions, and pelvic cul-de-sac. It was necessary to proceed with endovaginal exam following the transabdominal exam to visualize the uterus endometrium and ovaries. COMPARISON:  CT 11/05/2016, 06/14/2016 FINDINGS: Uterus Measurements: 5.3 x 3.2 x 4.2 cm. No fibroids or other mass visualized. Endometrium Thickness: 6 mm.  No focal abnormality visualized. Right ovary Not visualized Left ovary Measurements: 6.5 x 5.6  x 4.4 cm. Left ovary appears contiguous with a complex cystic mass measuring 4 x 6.3 x 4.7 cm. This contains multiple thin internal septations. Other findings Along the left adnexa/ pelvic sidewall is a solid mass measuring 7.3 x 4.1 x 6.6 cm IMPRESSION: 1. 6.3 cm complex cystic left  ovarian mass. This contains multiple thin internal septations. Surgical consultation is recommended. 2. 7.3 cm solid left adnexal/pelvic sidewall mass, corresponds to the CT demonstrated pelvic mass and history of lymphoma 3. Nonvisualization of the right ovary Electronically Signed   By: Donavan Foil M.D.   On: 12/11/2016 03:16   US Pelvis Complete  Result Date: 12/11/2016 CLINICAL DATA:  Left ovarian cyst on CT EXAM: TRANSABDOMINAL AND TRANSVAGINAL ULTRASOUND OF PELVIS TECHNIQUE: Both transabdominal and transvaginal ultrasound examinations of the pelvis were performed. Transabdominal technique was performed for global imaging of the pelvis including uterus, ovaries, adnexal regions, and pelvic cul-de-sac. It was necessary to proceed with endovaginal exam following the transabdominal exam to visualize the uterus endometrium and ovaries. COMPARISON:  CT 11/05/2016, 06/14/2016 FINDINGS: Uterus Measurements: 5.3 x 3.2 x 4.2 cm. No fibroids or other mass visualized. Endometrium Thickness: 6 mm.  No focal abnormality visualized. Right ovary Not visualized Left ovary Measurements: 6.5 x 5.6 x 4.4 cm. Left ovary appears contiguous with a complex cystic mass measuring 4 x 6.3 x 4.7 cm. This contains multiple thin internal septations. Other findings Along the left adnexa/ pelvic sidewall is a solid mass measuring 7.3 x 4.1 x 6.6 cm IMPRESSION: 1. 6.3 cm complex cystic left ovarian mass. This contains multiple thin internal septations. Surgical consultation is recommended. 2. 7.3 cm solid left adnexal/pelvic sidewall mass, corresponds to the CT demonstrated pelvic mass and history of lymphoma 3. Nonvisualization of the right ovary  Electronically Signed   By: Donavan Foil M.D.   On: 12/11/2016 03:16   US Venous Img Lower Unilateral Left  Result Date: 12/29/2016 CLINICAL DATA:  LEFT leg swelling. EXAM: LEFT LOWER EXTREMITY VENOUS DOPPLER ULTRASOUND TECHNIQUE: Gray-scale sonography with graded compression, as well as color Doppler and duplex ultrasound were performed to evaluate the lower extremity deep venous systems from the level of the common femoral vein and including the common femoral, femoral, profunda femoral, popliteal and calf veins including the posterior tibial, peroneal and gastrocnemius veins when visible. The superficial great saphenous vein was also interrogated. Spectral Doppler was utilized to evaluate flow at rest and with distal augmentation maneuvers in the common femoral, femoral and popliteal veins. COMPARISON:  08/28/2016, ultrasound FINDINGS: Contralateral Common Femoral Vein: Respiratory phasicity is normal and symmetric with the symptomatic side. No evidence of thrombus. Normal compressibility. Common Femoral Vein: No evidence of thrombus. Normal compressibility, respiratory phasicity and response to augmentation. Saphenofemoral Junction: No evidence of thrombus. Normal compressibility and flow on color Doppler imaging. Profunda Femoral Vein: No evidence of thrombus. Normal compressibility and flow on color Doppler imaging. Femoral Vein: No evidence of thrombus. Normal compressibility, respiratory phasicity and response to augmentation. Popliteal Vein: No evidence of thrombus. Normal compressibility, respiratory phasicity and response to augmentation. Calf Veins: No evidence of thrombus. Normal compressibility and flow on color Doppler imaging. Superficial Great Saphenous Vein: No evidence of thrombus. Normal compressibility and flow on color Doppler imaging. Other: Round cystic lesion LEFT groin measuring 4 cm nodule less than 7 cm on comparison exam. IMPRESSION: No evidence of DVT within the LEFT lower  extremity. Reduction in volume and large cystic lesion in the LEFT groin. Electronically Signed   By: Suzy Bouchard M.D.   On: 12/29/2016 17:05    Assessment:   HIBBA SCHRAM is a 66 y.o. female with MS, lymphoma admitted with recurrent LLE redness and swelling. She has a small abrasion at the site. Ws recently on keflex for this but recurred after dc.  She also  has a chronic foley and on last admit had UA with TNTC WBC and ucx with > 100 K Pseudomonas however she had no sxs and the repeat UA with only 6-30 wbc.  I suspect the Pseudomonas is colonization and she has had improvement with just changing of the foley cath.  The LLE cellulitis is likely related to recurrent edema and the small abrasion. She reports hx cellulitis in past and had unna-wraps at wound center per her report.   Recommendations  Elevate legs- discussed with patient, her husband and nurse Cont zosyn for another 1-2 days - this will cover the Pseudomonas and cellulitis.  If continues to improve can place a unnawrap on L leg and dc on oral keflex 500 TID for 10 days I can see in clinic in 1-2 weeks  Thank you very much for allowing me to participate in the care of this patient. Please call with questions.   Cheral Marker. Ola Spurr, MD

## 2017-01-03 NOTE — Care Management (Signed)
Patient admitted with UTI.  PCP Lada. Patient open with [facility].  Brittney will [facility] notified of admission. Patient lives at home with husband.  Patient has rollator, BSC, and shower seat in the home. PT has assessed patient and recommended SNF.  At this time patient is in agreement to placement.  RNCM signing off.  Please consult if indicated.

## 2017-01-03 NOTE — NC FL2 (Signed)
[place] LEVEL OF CARE SCREENING TOOL     IDENTIFICATION  Patient Name: [name] Birthdate: 15-Jul-1950 Sex: female Admission Date (Current Location): 01/02/2017  [facility] and [place] Number:  Engineering geologist and Address:  [facility], [address], [place], [place] [address]      Provider Number: [id number]  Attending Physician Name and Address:  Max Sane, MD  Relative Name and Phone Number:       Current Level of Care: Hospital Recommended Level of Care: [place] Prior Approval Number:    Date Approved/Denied:   PASRR Number:    Discharge Plan: SNF    Current Diagnoses: Patient Active Problem List   Diagnosis Date Noted  . UTI (urinary tract infection) 01/02/2017  . Cellulitis and abscess of leg 12/29/2016  . Left ovarian cyst 12/09/2016  . Abdominal aortic atherosclerosis ([place]) 11/11/2016  . Cellulitis of left lower extremity 10/11/2016  . Swelling of lower extremity 10/11/2016  . Obstructive sleep apnea 10/11/2016  . Bilateral lower leg cellulitis 10/11/2016  . Follicular lymphoma of intra-abdominal lymph nodes ([place]) 08/06/2016  . Multiple falls 06/07/2016  . Inguinal adenopathy 05/29/2016  . Breast cancer screening 01/05/2016  . Morbid obesity ([place]) 01/05/2016  . Pressure ulcer 12/19/2015  . Low HDL (under 40) 12/19/2015  . Cellulitis 12/18/2015  . Skin ulcer ([place]) 11/08/2015  . Peripheral vascular disease of lower extremity with ulceration ([place]) 11/08/2015  . CKD (chronic kidney disease) stage 3, GFR 30-59 ml/min 11/08/2015  . Decubitus ulcer of left thigh, stage 2 04/18/2015  . Constipation due to pain medication 01/31/2015  . Obstructive sleep apnea of adult 01/13/2015  . Pelvic muscle wasting 01/13/2015  . Incomplete bladder emptying 01/13/2015  . Headache, migraine 10/24/2014  . 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 Current tobacco use 10/24/2014  . Lumbar radiculopathy, chronic  10/02/2013  . COPD with bronchial hyperresponsiveness ([place]) 10/02/2013  . Major depressive disorder, recurrent, in partial remission ([place]) 10/02/2013  . Hypertension goal BP (blood pressure) < 140/90 10/02/2013  . Multiple sclerosis ([place]) 10/02/2013  . Absence of bladder continence 09/25/2012  . Acontractile bladder 02/12/2012  . Bladder retention 02/12/2012  . Narrowing of intervertebral disc space 11/26/2011  . Kyphoscoliosis and scoliosis 11/26/2011    Orientation RESPIRATION BLADDER Height & Weight     Self, Time, Situation, Place  Normal Incontinent Weight: (!) 303 lb 9.6 oz (137.7 kg) Height:  5\' 5"  (165.1 cm)  BEHAVIORAL SYMPTOMS/MOOD NEUROLOGICAL BOWEL NUTRITION STATUS   (none)  (none) Incontinent Diet (regular)  AMBULATORY STATUS COMMUNICATION OF NEEDS Skin   Limited Assist Verbally Normal                       Personal Care Assistance Level of Assistance  Bathing, Dressing Bathing Assistance: Limited assistance   Dressing Assistance: Limited assistance     Functional Limitations Info             SPECIAL CARE FACTORS FREQUENCY  PT (By licensed PT)                    Contractures Contractures Info: Not present    Additional Factors Info                  Current Medications (01/03/2017):  This is the current hospital active medication list Current Facility-Administered Medications  Medication Dose Route Frequency Provider Last Rate Last Dose  . 0.9 %  sodium chloride infusion  Intravenous Continuous [name], MD 125 mL/hr at 01/03/17 [id number]    . acetaminophen (TYLENOL) tablet 650 mg  650 mg Oral Q6H PRN [name], MD       Or  . acetaminophen (TYLENOL) suppository 650 mg  650 mg Rectal Q6H PRN Sudini, Alveta Heimlich, MD      . albuterol (PROVENTIL) (2.5 MG/3ML) 0.083% nebulizer solution 2.5 mg  2.5 mg Nebulization Q2H PRN [name], MD   2.5 mg at 01/03/17 1010  . bisacodyl (DULCOLAX) suppository 10 mg  10 mg Rectal Daily PRN [name], MD      . buPROPion (WELLBUTRIN XL) 24 hr tablet 300 mg  300 mg Oral Teresa Coombs, Srikar, MD   300 mg at 01/03/17 1008  . clonazePAM (KLONOPIN) tablet 1 mg  1 mg Oral QHS [name], MD   1 mg at 01/02/17 2228  . darifenacin (ENABLEX) 24 hr tablet 7.5 mg  7.5 mg Oral Daily Sudini, Alveta Heimlich, MD   7.5 mg at 01/03/17 1008  . DULoxetine (CYMBALTA) DR capsule 60 mg  60 mg Oral Teresa Coombs, Srikar, MD   60 mg at 01/03/17 1009  . enoxaparin (LOVENOX) injection 40 mg  40 mg Subcutaneous Q24H [name], MD   40 mg at 01/02/17 2228  . fluticasone furoate-vilanterol (BREO ELLIPTA) 100-25 MCG/INH 1 puff  1 puff Inhalation Daily [name], MD   1 puff at 01/03/17 1010  . gabapentin (NEURONTIN) tablet 300 mg  300 mg Oral TID [name], MD   300 mg at 01/03/17 1009  . methylphenidate (RITALIN) tablet 10 mg  10 mg Oral BID [name], MD   10 mg at 01/03/17 1010  . ondansetron (ZOFRAN) tablet 4 mg  4 mg Oral Q6H PRN [name], MD       Or  . ondansetron (ZOFRAN) injection 4 mg  4 mg Intravenous Q6H PRN Sudini, Srikar, MD      . piperacillin-tazobactam (ZOSYN) IVPB 3.375 g  3.375 g Intravenous Q8H Dicie Beam D, RPH 12.5 mL/hr at 01/03/17 1010 3.375 g at 01/03/17 1010  . polyethylene glycol (MIRALAX / GLYCOLAX) packet 17 g  17 g Oral Daily PRN Sudini, Alveta Heimlich, MD      . QUEtiapine (SEROQUEL XR) 24 hr tablet 150 mg  150 mg Oral QHS [name], MD   150 mg at 01/02/17 2251  . senna-docusate (Senokot-S) tablet 1 tablet  1 tablet Oral BID [name], MD   1 tablet at 01/03/17 1008  . tiZANidine (ZANAFLEX) tablet 4 mg  4 mg Oral QID [name], MD   4 mg at 01/03/17 1007  . topiramate (TOPAMAX) tablet 50 mg  50 mg Oral Daily Sudini, Alveta Heimlich, MD   50 mg at 01/03/17 1008   Facility-Administered Medications Ordered in Other Encounters  Medication Dose Route Frequency Provider Last Rate Last Dose  . heparin lock flush 100 unit/mL  500 Units Intracatheter Once PRN Sindy Guadeloupe, MD         Discharge Medications: Please see discharge summary for a list of discharge medications.  Relevant Imaging Results:  Relevant Lab Results:   Additional Information ss: [id number]  Shela Leff, LCSW

## 2017-01-03 NOTE — Progress Notes (Signed)
Anticoagulation Note  66 y/o F on Lovenox 40 mg daily.   Filed Weights   01/02/17 1759 01/02/17 2034 01/03/17 0500  Weight: 298 lb (135.2 kg) (!) 303 lb 11.2 oz (137.8 kg) (!) 303 lb 9.6 oz (137.7 kg)   Body mass index is 50.52 kg/m.  Estimated Creatinine Clearance: 72.9 mL/min (A) (by C-G formula based on SCr of 1.07 mg/dL (H)).  Will increase Lovenox dosing to 40 mg bid for weight > 100 kg and BMI > 40.   Ulice Dash, PharmD Clinical Pharmacist

## 2017-01-04 LAB — CBC
HCT: 25.5 % — ABNORMAL LOW (ref 35.0–47.0)
Hemoglobin: 9 g/dL — ABNORMAL LOW (ref 12.0–16.0)
MCH: 29.8 pg (ref 26.0–34.0)
MCHC: 35.1 g/dL (ref 32.0–36.0)
MCV: 84.6 fL (ref 80.0–100.0)
Platelets: 152 10*3/uL (ref 150–440)
RBC: 3.02 MIL/uL — ABNORMAL LOW (ref 3.80–5.20)
RDW: 15.3 % — ABNORMAL HIGH (ref 11.5–14.5)
WBC: 3.4 10*3/uL — ABNORMAL LOW (ref 3.6–11.0)

## 2017-01-04 LAB — BASIC METABOLIC PANEL
Anion gap: 6 (ref 5–15)
BUN: 15 mg/dL (ref 6–20)
CO2: 24 mmol/L (ref 22–32)
Calcium: 8 mg/dL — ABNORMAL LOW (ref 8.9–10.3)
Chloride: 103 mmol/L (ref 101–111)
Creatinine, Ser: 1.18 mg/dL — ABNORMAL HIGH (ref 0.44–1.00)
GFR calc Af Amer: 54 mL/min — ABNORMAL LOW (ref >60)
GFR calc non Af Amer: 47 mL/min — ABNORMAL LOW (ref >60)
Glucose, Bld: 98 mg/dL (ref 65–99)
Potassium: 3.5 mmol/L (ref 3.5–5.1)
Sodium: 133 mmol/L — ABNORMAL LOW (ref 135–145)

## 2017-01-04 NOTE — Progress Notes (Signed)
[place] at [place] NAME: [name]    MR#:  [id number]  DATE OF BIRTH:  17-Aug-1950  SUBJECTIVE:  CHIEF COMPLAINT:   Chief Complaint  Patient presents with  . Urinary Tract Infection   Pain is well controlled. Afebrile.  REVIEW OF SYSTEMS:  Review of Systems  Constitutional: Positive for malaise/fatigue. Negative for chills, fever and weight loss.  HENT: Negative for nosebleeds and sore throat.   Eyes: Negative for blurred vision.  Respiratory: Negative for cough, shortness of breath and wheezing.   Cardiovascular: Negative for chest pain, orthopnea, leg swelling and PND.  Gastrointestinal: Negative for abdominal pain, constipation, diarrhea, heartburn, nausea and vomiting.  Genitourinary: Negative for dysuria and urgency.  Musculoskeletal: Negative for back pain.  Skin: Negative for rash.  Neurological: Positive for weakness. Negative for dizziness, speech change, focal weakness and headaches.  Endo/Heme/Allergies: Does not bruise/bleed easily.  Psychiatric/Behavioral: Negative for depression.    DRUG ALLERGIES:  No Known Allergies VITALS:  Blood pressure (!) 97/50, pulse (!) 59, temperature 97.9 F (36.6 C), temperature source Oral, resp. rate 20, height 5\' 5"  (1.651 m), weight (!) 138.1 kg (304 lb 8 oz), SpO2 95 %. PHYSICAL EXAMINATION:  Physical Exam  Constitutional: She is oriented to person, place, and time and well-developed, well-nourished, and in no distress.  HENT:  Head: Normocephalic and atraumatic.  Eyes: Pupils are equal, round, and reactive to light. Conjunctivae and EOM are normal.  Neck: Normal range of motion. Neck supple. No tracheal deviation present. No thyromegaly present.  Cardiovascular: Normal rate, regular rhythm and normal heart sounds.   Pulmonary/Chest: Effort normal and breath sounds normal. No respiratory distress. She has no wheezes. She exhibits no tenderness.  Abdominal: Soft. Bowel sounds  are normal. She exhibits no distension. There is no tenderness.  Musculoskeletal: Normal range of motion.  Neurological: She is alert and oriented to person, place, and time. No cranial nerve deficit.  Skin: Skin is warm and dry. No rash noted.  Psychiatric: Mood and affect normal.   LABORATORY PANEL:  Female CBC  Recent Labs Lab 01/04/17 0449  WBC 3.4*  HGB 9.0*  HCT 25.5*  PLT 152   ------------------------------------------------------------------------------------------------------------------ Chemistries   Recent Labs Lab 01/02/17 1803  01/04/17 0449  NA 126*  < > 133*  K 3.9  < > 3.5  CL 93*  < > 103  CO2 22  < > 24  GLUCOSE 90  < > 98  BUN 15  < > 15  CREATININE 1.15*  < > 1.18*  CALCIUM 9.6  < > 8.0*  AST 25  --   --   ALT 21  --   --   ALKPHOS 73  --   --   BILITOT 0.8  --   --   < > = values in this interval not displayed. RADIOLOGY:  No results found. ASSESSMENT AND PLAN:  66 y.o. female with a known history of Multiple sclerosis, muscle spasms, chronic Foley catheter, hypertension who was recently in the hospital for UTI and left flexor tinnitus returns due to her oncologist Dr. Janese Banks calling her that cultures show pseudomonas in the urine.  * Catheter related Pseudomonas UTI - continue IV Zosyn. This should also cover the cellulitis on her leg.  - Appreciate infectious disease input Interaction  with ciprofloxacin and Tiznidine patient takes at home limits use.  * Left leg cellulitis - continue IV Zosyn.  * Hyponatremia: Likely due to low solute  load.  - HCTZ stopped Improving  *  Hypokalemia -Repleted  * Generalized weakness. Likely due to her lymphoma and ongoing chemotherapy and radiation. Also has UTI. - physical therapy recommends rehabilitation.  Social worker aware.  * Multiple sclerosis. Continue home medications  * DVT prophylaxis with Lovenox  All the records are reviewed and case discussed with Care Management/Social  Worker.  CODE STATUS: Full Code  TOTAL TIME TAKING CARE OF THIS PATIENT: 30 minutes.   POSSIBLE D/C IN 1-2 DAYS, DEPENDING ON CLINICAL CONDITION.   [name] R M.D on 01/04/2017 at 10:39 AM  Between 7am to 6pm - Pager - [phone]  After 6pm go to [email or web address] - Proofreader  [facility]  Office  [phone]  CC: Primary care physician; Arnetha Courser, MD  Note: This dictation was prepared with Dragon dictation along with smaller phrase technology. Any transcriptional errors that result from this process are unintentional.

## 2017-01-04 NOTE — Progress Notes (Signed)
Pt has had three loose watery bowel movements in the last 24 hours, pt has had senna PO the last two days. Prime doctor notified, new orders to discontinue senna. If pt continues to have frequent bowel movements tomorrow then we will initiate Cdiff precautions. Will continue to monitor pt.   Henretta Quist [employer]

## 2017-01-04 NOTE — Clinical Social Work Note (Signed)
CSW provided bed offers to the patient at bedside. The patient has chosen [facility]. The CSW has updated the choice in the HUB. CSW will continue to follow for discharge facilitation.  Santiago Bumpers, MSW, Latanya Presser [phone]

## 2017-01-04 NOTE — Progress Notes (Deleted)
RN notified prime doctor that pt has now had three loose and watery stools in the last 24 hours. Verbal orders to place pt on Cdiff rule out. Pt and pt.'s family was educated on the importance of precautions.   [name] [employer]

## 2017-01-05 NOTE — Progress Notes (Signed)
[place] at [place] NAME: [name]    MR#:  [id number]  DATE OF BIRTH:  January 14, 1951  SUBJECTIVE:  CHIEF COMPLAINT:   Chief Complaint  Patient presents with  . Urinary Tract Infection   Pain is well controlled. Afebrile.  Patient had watery diarrhea but also took senna.  REVIEW OF SYSTEMS:  Review of Systems  Constitutional: Positive for malaise/fatigue. Negative for chills, fever and weight loss.  HENT: Negative for nosebleeds and sore throat.   Eyes: Negative for blurred vision.  Respiratory: Negative for cough, shortness of breath and wheezing.   Cardiovascular: Negative for chest pain, orthopnea, leg swelling and PND.  Gastrointestinal: Negative for abdominal pain, constipation, diarrhea, heartburn, nausea and vomiting.  Genitourinary: Negative for dysuria and urgency.  Musculoskeletal: Negative for back pain.  Skin: Negative for rash.  Neurological: Positive for weakness. Negative for dizziness, speech change, focal weakness and headaches.  Endo/Heme/Allergies: Does not bruise/bleed easily.  Psychiatric/Behavioral: Negative for depression.    DRUG ALLERGIES:  No Known Allergies VITALS:  Blood pressure (!) 108/50, pulse (!) 57, temperature 98 F (36.7 C), temperature source Oral, resp. rate 16, height 5\' 5"  (1.651 m), weight (!) 138.1 kg (304 lb 8 oz), SpO2 97 %. PHYSICAL EXAMINATION:  Physical Exam  Constitutional: She is oriented to person, place, and time and well-developed, well-nourished, and in no distress.  HENT:  Head: Normocephalic and atraumatic.  Eyes: Pupils are equal, round, and reactive to light. Conjunctivae and EOM are normal.  Neck: Normal range of motion. Neck supple. No tracheal deviation present. No thyromegaly present.  Cardiovascular: Normal rate, regular rhythm and normal heart sounds.   Pulmonary/Chest: Effort normal and breath sounds normal. No respiratory distress. She has no wheezes. She  exhibits no tenderness.  Abdominal: Soft. Bowel sounds are normal. She exhibits no distension. There is no tenderness.  Musculoskeletal: Normal range of motion.  Neurological: She is alert and oriented to person, place, and time. No cranial nerve deficit.  Skin: Skin is warm and dry. No rash noted.  Psychiatric: Mood and affect normal.   LABORATORY PANEL:  Female CBC  Recent Labs Lab 01/04/17 0449  WBC 3.4*  HGB 9.0*  HCT 25.5*  PLT 152   ------------------------------------------------------------------------------------------------------------------ Chemistries   Recent Labs Lab 01/02/17 1803  01/04/17 0449  NA 126*  < > 133*  K 3.9  < > 3.5  CL 93*  < > 103  CO2 22  < > 24  GLUCOSE 90  < > 98  BUN 15  < > 15  CREATININE 1.15*  < > 1.18*  CALCIUM 9.6  < > 8.0*  AST 25  --   --   ALT 21  --   --   ALKPHOS 73  --   --   BILITOT 0.8  --   --   < > = values in this interval not displayed. RADIOLOGY:  No results found. ASSESSMENT AND PLAN:  66 y.o. female with a known history of Multiple sclerosis, muscle spasms, chronic Foley catheter, hypertension who was recently in the hospital for UTI and left flexor tinnitus returns due to her oncologist Dr. Janese Banks calling her that cultures show pseudomonas in the urine.  * Catheter related Pseudomonas UTI - continue IV Zosyn. This should also cover the cellulitis on her leg.  - Appreciate infectious disease input Interaction  with ciprofloxacin and Tiznidine patient takes at home limits use.  * Left leg cellulitis - continue IV  Zosyn.  * Hyponatremia: Likely due to low solute load.  - HCTZ stopped Improving  *  Hypokalemia -Repleted  * Generalized weakness. Likely due to her lymphoma and ongoing chemotherapy and radiation. Also has UTI. - physical therapy recommends rehabilitation.  Social worker aware.  * Multiple sclerosis. Continue home medications  * Diarrhea likely due to senna. Stop. If this continues to have the  check for C. Difficile.  * DVT prophylaxis with Lovenox  All the records are reviewed and case discussed with Care Management/Social Worker.  CODE STATUS: Full Code  TOTAL TIME TAKING CARE OF THIS PATIENT: 30 minutes.   POSSIBLE D/C IN 1-2 DAYS, DEPENDING ON CLINICAL CONDITION.   [name] R M.D on 01/05/2017 at 12:28 PM  Between 7am to 6pm - Pager - [phone]  After 6pm go to [email or web address] - Proofreader  [facility] [place] [facility]  Office  [phone]  CC: Primary care physician; Arnetha Courser, MD  Note: This dictation was prepared with Dragon dictation along with smaller phrase technology. Any transcriptional errors that result from this process are unintentional.

## 2017-01-06 ENCOUNTER — Ambulatory Visit: Payer: Medicare Other

## 2017-01-06 ENCOUNTER — Telehealth: Payer: Self-pay

## 2017-01-06 ENCOUNTER — Other Ambulatory Visit: Payer: Self-pay | Admitting: *Deleted

## 2017-01-06 ENCOUNTER — Ambulatory Visit
Admission: RE | Admit: 2017-01-06 | Discharge: 2017-01-06 | Disposition: A | Discharge status: Home / Self Care | Payer: Medicare Other | Source: Ambulatory Visit | Attending: Radiation Oncology | Admitting: Radiation Oncology

## 2017-01-06 DIAGNOSIS — I10 Essential (primary) hypertension: Secondary | ICD-10-CM | POA: Diagnosis not present

## 2017-01-06 DIAGNOSIS — G35 Multiple sclerosis: Secondary | ICD-10-CM | POA: Diagnosis not present

## 2017-01-06 DIAGNOSIS — N390 Urinary tract infection, site not specified: Secondary | ICD-10-CM | POA: Diagnosis not present

## 2017-01-06 DIAGNOSIS — J42 Unspecified chronic bronchitis: Secondary | ICD-10-CM | POA: Diagnosis not present

## 2017-01-06 DIAGNOSIS — E871 Hypo-osmolality and hyponatremia: Secondary | ICD-10-CM | POA: Diagnosis not present

## 2017-01-06 DIAGNOSIS — K5900 Constipation, unspecified: Secondary | ICD-10-CM | POA: Diagnosis not present

## 2017-01-06 DIAGNOSIS — Z87891 Personal history of nicotine dependence: Secondary | ICD-10-CM | POA: Diagnosis not present

## 2017-01-06 DIAGNOSIS — Z510 Encounter for antineoplastic radiation therapy: Secondary | ICD-10-CM | POA: Diagnosis not present

## 2017-01-06 DIAGNOSIS — F329 Major depressive disorder, single episode, unspecified: Secondary | ICD-10-CM | POA: Diagnosis not present

## 2017-01-06 DIAGNOSIS — L03116 Cellulitis of left lower limb: Secondary | ICD-10-CM | POA: Diagnosis not present

## 2017-01-06 DIAGNOSIS — M6281 Muscle weakness (generalized): Secondary | ICD-10-CM | POA: Diagnosis not present

## 2017-01-06 DIAGNOSIS — J449 Chronic obstructive pulmonary disease, unspecified: Secondary | ICD-10-CM | POA: Diagnosis not present

## 2017-01-06 DIAGNOSIS — E669 Obesity, unspecified: Secondary | ICD-10-CM | POA: Diagnosis not present

## 2017-01-06 DIAGNOSIS — N3000 Acute cystitis without hematuria: Secondary | ICD-10-CM | POA: Diagnosis not present

## 2017-01-06 DIAGNOSIS — Z466 Encounter for fitting and adjustment of urinary device: Secondary | ICD-10-CM | POA: Diagnosis not present

## 2017-01-06 DIAGNOSIS — C8290 Follicular lymphoma, unspecified, unspecified site: Secondary | ICD-10-CM | POA: Diagnosis not present

## 2017-01-06 DIAGNOSIS — R531 Weakness: Secondary | ICD-10-CM | POA: Diagnosis not present

## 2017-01-06 DIAGNOSIS — N839 Noninflammatory disorder of ovary, fallopian tube and broad ligament, unspecified: Secondary | ICD-10-CM | POA: Diagnosis present

## 2017-01-06 DIAGNOSIS — B965 Pseudomonas (aeruginosa) (mallei) (pseudomallei) as the cause of diseases classified elsewhere: Secondary | ICD-10-CM | POA: Diagnosis not present

## 2017-01-06 DIAGNOSIS — C8213 Follicular lymphoma grade II, intra-abdominal lymph nodes: Secondary | ICD-10-CM | POA: Diagnosis not present

## 2017-01-06 LAB — AEROBIC CULTURE  (SUPERFICIAL SPECIMEN): Culture: NO GROWTH

## 2017-01-06 LAB — AEROBIC CULTURE W GRAM STAIN (SUPERFICIAL SPECIMEN)
Gram Stain: NONE SEEN
Special Requests: NORMAL

## 2017-01-06 MED ORDER — CLONAZEPAM 1 MG PO TABS: 1.0000 mg | ORAL_TABLET | Freq: Every day | ORAL | 0 refills | Status: DC

## 2017-01-06 MED ORDER — CEPHALEXIN 500 MG PO CAPS: 500.0000 mg | ORAL_CAPSULE | Freq: Three times a day (TID) | ORAL | 0 refills | Status: AC

## 2017-01-06 MED ORDER — POLYETHYLENE GLYCOL 3350 17 GM/SCOOP PO POWD: 17.0000 g | Freq: Every day | ORAL | Status: DC | PRN

## 2017-01-06 MED ORDER — LOPERAMIDE HCL 2 MG PO CAPS
2.0000 mg | ORAL_CAPSULE | Freq: Once | ORAL | Status: AC
  Administered 2017-01-06: 2 mg via ORAL
  Filled 2017-01-06: qty 1

## 2017-01-06 NOTE — Clinical Social Work Placement (Signed)
   CLINICAL SOCIAL WORK PLACEMENT  NOTE  Date:  01/06/2017  Patient Details  Name: [name] MRN: [id number] Date of Birth: Jul 04, 1950  Clinical Social Work is seeking post-discharge placement for this patient at the [place] level of care (*CSW will initial, date and re-position this form in  chart as items are completed):  Yes   Patient/family provided with [place] Work Department's list of facilities offering this level of care within the geographic area requested by the patient (or if unable, by the patient's family).  Yes   Patient/family informed of their freedom to choose among providers that offer the needed level of care, that participate in Medicare, Medicaid or managed care program needed by the patient, have an available bed and are willing to accept the patient.  Yes   Patient/family informed of [place]'s ownership interest in [facility] and [facility], as well as of the fact that they are under no obligation to receive care at these facilities.  PASRR submitted to EDS on 01/03/17     PASRR number received on 01/06/17     Existing PASRR number confirmed on       FL2 transmitted to all facilities in geographic area requested by pt/family on 01/03/17     FL2 transmitted to all facilities within larger geographic area on       Patient informed that his/her managed care company has contracts with or will negotiate with certain facilities, including the following:        Yes   Patient/family informed of bed offers received.  Patient chooses bed at  [facility])     Physician recommends and patient chooses bed at  [facility])    Patient to be transferred to  [facility]) on 01/06/17.  Patient to be transferred to facility by  (husband)     Patient family notified on 01/06/17 of transfer.  Name of family member notified:   (patient to notify husband)     PHYSICIAN       Additional Comment:     _______________________________________________ Shela Leff, LCSW 01/06/2017, 11:28 AM

## 2017-01-06 NOTE — Care Management Important Message (Signed)
Important Message  Patient Details  Name: [name] MRN: [id number] Date of Birth: 1950-07-02   Medicare Important Message Given:  Yes    Beverly Sessions, RN 01/06/2017, 11:34 AM

## 2017-01-06 NOTE — Telephone Encounter (Signed)
Pt was admitted to the hospital on 01/02/2017. Philbert Riser, [facility] PTA states that once pt is discharged from the hospital she will be at [facility] where she will start Physical therapy. Jeneen Rinks states due to that she will be discharged from the agency since they will do her PT there. Call back # if you have any questions [phone].

## 2017-01-06 NOTE — Discharge Summary (Addendum)
[name] at [name] NAME: [name]    MR#:  [id number]  DATE OF BIRTH:  12-20-1950  DATE OF ADMISSION:  01/02/2017 ADMITTING PHYSICIAN: [name], MD  DATE OF DISCHARGE: 01/06/2017  PRIMARY CARE PHYSICIAN: Arnetha Courser, MD   ADMISSION DIAGNOSIS:  Cellulitis of right lower extremity [W09.811] Complicated UTI (urinary tract infection) [N39.0]  DISCHARGE DIAGNOSIS:  Active Problems:   UTI (urinary tract infection)   SECONDARY DIAGNOSIS:   Past Medical History:  Diagnosis Date  . Abdominal aortic atherosclerosis ([place]) 11/11/2016  . ADHD   . Anxiety   . COPD (chronic obstructive pulmonary disease) ([place])   . Depression    major depressive  . Dyspnea    doe  . Edema    left leg  . Follicular lymphoma ([place])    B Cell  . Follicular lymphoma grade II ([place])   . Hypertension   . Hypotension    idiopathic  . Kyphoscoliosis and scoliosis 11/26/2011  . Morbid obesity ([place]) 01/05/2016  . Multiple sclerosis ([place])   . Multiple sclerosis ([place])    1980's  . Neuromuscular disorder ([place])   . Obstructive and reflux uropathy    foley  . Pain    atypical facial  . Peripheral vascular disease of lower extremity with ulceration ([place]) 11/08/2015  . Skin ulcer ([place]) 11/08/2015  . Weakness    generalized. has MS     ADMITTING HISTORY  HISTORY OF PRESENT ILLNESS:  [name]  is a 66 y.o. female with a known history of Multiple sclerosis, muscle spasms, chronic Foley catheter, hypertension who was recently in the hospital for UTI and left flexor tinnitus returns due to her oncologist Dr. Janese Banks calling her that cultures show pseudomonas in the urine. Wanted to start her on ciprofloxacin but due to interaction with nizatidine patient was asked to come to the emergency room. Also family very frustrated that patient was in the hospital only for one day and discharged. Redness in the left leg still persistent. Patient is afebrile and has normal  WBC.   HOSPITAL COURSE:   66 y.o.femalewith a known history of Multiple sclerosis, muscle spasms, chronic Foley catheter, hypertension who was recently in the hospital for UTI and left flexor tinnitus returns due to her oncologist Dr. Janese Banks calling her that cultures show pseudomonas in the urine.  * Catheter related Pseudomonas UTI - ON IV Zosyn in the hospital and finished course - Appreciate infectious disease input. Seen by Dr. Eloise Harman with ciprofloxacin and Tiznidine patient takes at home limits its use.  * Left leg cellulitis - On IV Zosyn and will change to keflex at discharge  * Hyponatremia: Likely due to low solute load.  - HCTZ stopped  * Hypertension Well controlled HCTZ stopped  *  Hypokalemia -Repleted  * Generalized weakness. Likely due to her lymphoma and ongoing chemotherapy and radiation. Also has UTI. - physical therapy recommends rehabilitation for PT  * Multiple sclerosis. Continue home medications  * Diarrhea due to senna and Miralax Improved  Stable for discharge to SNF  CONSULTS OBTAINED:  Treatment Team:  Leonel Ramsay, MD  DRUG ALLERGIES:  No Known Allergies  DISCHARGE MEDICATIONS:   Current Discharge Medication List    CONTINUE these medications which have CHANGED   Details  cephALEXin (KEFLEX) 500 MG capsule Take 1 capsule (500 mg total) by mouth 3 (three) times daily. Qty: 12 capsule, Refills: 0    clonazePAM (KLONOPIN) 1 MG tablet  Take 1 tablet (1 mg total) by mouth at bedtime. Qty: 5 tablet, Refills: 0    polyethylene glycol powder (GLYCOLAX/MIRALAX) powder Take 17 g by mouth daily as needed for mild constipation. Mixed in water   Associated Diagnoses: Constipation due to pain medication      CONTINUE these medications which have NOT CHANGED   Details  buPROPion (WELLBUTRIN XL) 300 MG 24 hr tablet Take 300 mg by mouth every morning.    Associated Diagnoses: Malignant lymphoma, undifferentiated  cell, non-Burkitt's (HCC)    DULoxetine (CYMBALTA) 60 MG capsule Take 1 capsule (60 mg total) by mouth every morning. Qty: 30 capsule, Refills: 3    fluticasone furoate-vilanterol (BREO ELLIPTA) 100-25 MCG/INH AEPB Inhale 1 puff into the lungs daily. Qty: 1 each, Refills: 11    gabapentin (NEURONTIN) 600 MG tablet Take 0.5 tablets (300 mg total) by mouth 3 (three) times daily.    interferon beta-1a (AVONEX) 30 MCG/0.5ML PSKT injection Inject 30 mcg into the muscle every Monday.    lidocaine-prilocaine (EMLA) cream Apply 1 application topically as needed. apply cream over the port site and then place small piece of saran wrap over the cream to protect your clothing Qty: 30 g, Refills: 1    lisinopril (PRINIVIL,ZESTRIL) 5 MG tablet Take 1 tablet (5 mg total) by mouth daily. Qty: 30 tablet, Refills: 1    methylphenidate (RITALIN) 10 MG tablet Take 1 tablet (10 mg total) by mouth 2 (two) times daily. Qty: 30 tablet, Refills: 0    Multiple Vitamin (MULTIVITAMIN WITH MINERALS) TABS tablet Take 1 tablet by mouth daily.    QUEtiapine Fumarate (SEROQUEL XR) 150 MG 24 hr tablet Take 150 mg by mouth at bedtime.  Refills: 4   Associated Diagnoses: Follicular lymphoma of intra-abdominal lymph nodes, unspecified follicular lymphoma type ([place]); Visit for monitoring Rituxan therapy    senna-docusate (SENOKOT-S) 8.6-50 MG tablet Take 1 tablet by mouth daily as needed for mild constipation.     solifenacin (VESICARE) 10 MG tablet Take 10 mg by mouth daily.     tiZANidine (ZANAFLEX) 4 MG tablet Take 1 tablet (4 mg total) by mouth 4 (four) times daily. TAKES FOR MS Qty: 30 tablet, Refills: 0    topiramate (TOPAMAX) 50 MG tablet Take 1 tablet (50 mg total) by mouth daily. Qty: 30 tablet, Refills: 0    vitamin C (ASCORBIC ACID) 500 MG tablet Take 500 mg by mouth daily.      STOP taking these medications     hydrochlorothiazide (MICROZIDE) 12.5 MG capsule         Today   VITAL SIGNS:   Blood pressure (!) 110/47, pulse 61, temperature 97.8 F (36.6 C), temperature source Oral, resp. rate 20, height 5\' 5"  (1.651 m), weight (!) 138.3 kg (304 lb 12.8 oz), SpO2 95 %.  I/O:   Intake/Output Summary (Last 24 hours) at 01/06/17 0920 Last data filed at 01/06/17 1610  Gross per 24 hour  Intake              335 ml  Output             1600 ml  Net            -1265 ml    PHYSICAL EXAMINATION:  Physical Exam  GENERAL:  66 y.o.-year-old patient lying in the bed with no acute distress.  LUNGS: Normal breath sounds bilaterally, no wheezing, rales,rhonchi or crepitation. No use of accessory muscles of respiration.  CARDIOVASCULAR: S1, S2 normal. No murmurs,  rubs, or gallops.  ABDOMEN: Soft, non-tender, non-distended. Bowel sounds present. No organomegaly or mass.  Foley catheter in place NEUROLOGIC: Moves all 4 extremities. PSYCHIATRIC: The patient is alert and awake SKIN: Mild erythema left leg  DATA REVIEW:   CBC  Recent Labs Lab 01/04/17 0449  WBC 3.4*  HGB 9.0*  HCT 25.5*  PLT 152    Chemistries   Recent Labs Lab 01/02/17 1803  01/04/17 0449  NA 126*  < > 133*  K 3.9  < > 3.5  CL 93*  < > 103  CO2 22  < > 24  GLUCOSE 90  < > 98  BUN 15  < > 15  CREATININE 1.15*  < > 1.18*  CALCIUM 9.6  < > 8.0*  AST 25  --   --   ALT 21  --   --   ALKPHOS 73  --   --   BILITOT 0.8  --   --   < > = values in this interval not displayed.  Cardiac Enzymes No results for input(s): TROPONINI in the last 168 hours.  Microbiology Results  Results for orders placed or performed during the hospital encounter of 01/02/17  Blood culture (routine x 2)     Status: None (Preliminary result)   Collection Time: 01/02/17  7:01 PM  Result Value Ref Range Status   Specimen Description BLOOD BLOOD RIGHT WRIST  Final   Special Requests   Final    BOTTLES DRAWN AEROBIC AND ANAEROBIC Blood Culture adequate volume   Culture NO GROWTH 4 DAYS  Final   Report Status PENDING   Incomplete  Blood culture (routine x 2)     Status: None (Preliminary result)   Collection Time: 01/02/17  7:01 PM  Result Value Ref Range Status   Specimen Description BLOOD LEFT ANTECUBITAL  Final   Special Requests   Final    BOTTLES DRAWN AEROBIC AND ANAEROBIC Blood Culture results may not be optimal due to an excessive volume of blood received in culture bottles   Culture NO GROWTH 4 DAYS  Final   Report Status PENDING  Incomplete  Aerobic Culture (superficial specimen)     Status: None (Preliminary result)   Collection Time: 01/03/17  4:24 PM  Result Value Ref Range Status   Specimen Description LEG LEFT LEG  Final   Special Requests Normal  Final   Gram Stain NO WBC SEEN NO ORGANISMS SEEN   Final   Culture   Final    NO GROWTH 2 DAYS Performed at [facility], [address], [place], [place] [address]    Report Status PENDING  Incomplete    RADIOLOGY:  No results found.  Follow up with PCP in 1 week.  Management plans discussed with the patient, family and they are in agreement.  CODE STATUS:     Code Status Orders        Start     Ordered   01/02/17 1902  Full code  Continuous     01/02/17 1903    Code Status History    Date Active Date Inactive Code Status Order ID Comments User Context   12/29/2016 12:52 PM 12/30/2016  5:22 PM Full Code [id number]  Nicholes Mango, MD Inpatient   10/11/2016 11:20 PM 10/14/2016  5:26 PM Full Code [id number]  Theodoro Grist, MD Inpatient   08/28/2016 11:59 AM 08/30/2016  7:32 PM Full Code [id number]  Loletha Grayer, MD ED   12/18/2015  7:56 PM  12/19/2015  7:39 PM Full Code [id number]  Hower, Aaron Mose, MD ED      TOTAL TIME TAKING CARE OF THIS PATIENT ON DAY OF DISCHARGE: more than 30 minutes.   [name] R M.D on 01/06/2017 at 9:20 AM  Between 7am to 6pm - Pager - [phone]  After 6pm go to [email or web address] - password [email or web address] [facility]  Office  [phone]  CC: Primary care physician; Arnetha Courser, MD  Note: This dictation was prepared with Dragon dictation along with smaller phrase technology. Any transcriptional errors that result from this process are unintentional.

## 2017-01-06 NOTE — Clinical Social Work Note (Signed)
CSW has received [facility] number and patient can discharge to [facility]. Discharge information sent. Nurse to call report. Patient transports via husband. Shela Leff MSW,LCSW [phone]

## 2017-01-06 NOTE — Care Management (Signed)
Notified [place] with [facility] of discharge disposition.

## 2017-01-06 NOTE — Patient Outreach (Signed)
View in Epic pt discharged to [facility] today 01/06/17 after recent hospitalization September 6-10,2018 for UTI.   In basket sent to Burgess Amor RN CCM post acute care coordinator informing of admission today to [facility].   Plan:    RN CM to follow up with pt at discharge.              Care plan updated.   Zara Chess.   [facility]  [phone]

## 2017-01-06 NOTE — Progress Notes (Signed)
Pt discharged per MD order. Pt to go to [facility]. Pt is being driven there by a friend. Discharge paperwork reviewed with pt. Prescription given to pt. IVs removed. Questions answered to pt satisfaction. Report called in and given to [facility].

## 2017-01-06 NOTE — Discharge Instructions (Signed)
Regular diet  Activity as tolerated with assistance  Continue foley catheter

## 2017-01-07 ENCOUNTER — Ambulatory Visit: Payer: Medicare Other

## 2017-01-07 ENCOUNTER — Ambulatory Visit
Admission: RE | Admit: 2017-01-07 | Discharge: 2017-01-07 | Disposition: A | Discharge status: Home / Self Care | Payer: Medicare Other | Source: Ambulatory Visit | Attending: Radiation Oncology | Admitting: Radiation Oncology

## 2017-01-07 ENCOUNTER — Inpatient Hospital Stay: Payer: Medicare Other

## 2017-01-07 DIAGNOSIS — N390 Urinary tract infection, site not specified: Secondary | ICD-10-CM | POA: Diagnosis not present

## 2017-01-07 DIAGNOSIS — K5900 Constipation, unspecified: Secondary | ICD-10-CM | POA: Diagnosis not present

## 2017-01-07 DIAGNOSIS — C8213 Follicular lymphoma grade II, intra-abdominal lymph nodes: Secondary | ICD-10-CM | POA: Diagnosis not present

## 2017-01-07 DIAGNOSIS — G35 Multiple sclerosis: Secondary | ICD-10-CM | POA: Diagnosis not present

## 2017-01-07 DIAGNOSIS — B965 Pseudomonas (aeruginosa) (mallei) (pseudomallei) as the cause of diseases classified elsewhere: Secondary | ICD-10-CM | POA: Diagnosis not present

## 2017-01-07 DIAGNOSIS — R531 Weakness: Secondary | ICD-10-CM | POA: Diagnosis not present

## 2017-01-07 DIAGNOSIS — J42 Unspecified chronic bronchitis: Secondary | ICD-10-CM | POA: Diagnosis not present

## 2017-01-07 DIAGNOSIS — Z510 Encounter for antineoplastic radiation therapy: Secondary | ICD-10-CM | POA: Diagnosis not present

## 2017-01-07 DIAGNOSIS — C8293 Follicular lymphoma, unspecified, intra-abdominal lymph nodes: Secondary | ICD-10-CM

## 2017-01-07 DIAGNOSIS — J449 Chronic obstructive pulmonary disease, unspecified: Secondary | ICD-10-CM | POA: Diagnosis not present

## 2017-01-07 DIAGNOSIS — N839 Noninflammatory disorder of ovary, fallopian tube and broad ligament, unspecified: Secondary | ICD-10-CM | POA: Diagnosis not present

## 2017-01-07 DIAGNOSIS — Z87891 Personal history of nicotine dependence: Secondary | ICD-10-CM | POA: Diagnosis not present

## 2017-01-07 DIAGNOSIS — C8590 Non-Hodgkin lymphoma, unspecified, unspecified site: Secondary | ICD-10-CM | POA: Insufficient documentation

## 2017-01-07 LAB — CULTURE, BLOOD (ROUTINE X 2)
Culture: NO GROWTH
Culture: NO GROWTH
Special Requests: ADEQUATE

## 2017-01-07 LAB — CBC
HCT: 32.2 % — ABNORMAL LOW (ref 35.0–47.0)
Hemoglobin: 11 g/dL — ABNORMAL LOW (ref 12.0–16.0)
MCH: 28.8 pg (ref 26.0–34.0)
MCHC: 34.1 g/dL (ref 32.0–36.0)
MCV: 84.4 fL (ref 80.0–100.0)
Platelets: 287 10*3/uL (ref 150–440)
RBC: 3.81 MIL/uL (ref 3.80–5.20)
RDW: 15.8 % — ABNORMAL HIGH (ref 11.5–14.5)
WBC: 6.1 10*3/uL (ref 3.6–11.0)

## 2017-01-07 NOTE — Telephone Encounter (Signed)
I have reviewed this information, and I will leave for Dr. Sanda Klein upon her return.

## 2017-01-08 ENCOUNTER — Ambulatory Visit: Payer: Medicare Other

## 2017-01-09 ENCOUNTER — Other Ambulatory Visit: Payer: Self-pay | Admitting: *Deleted

## 2017-01-09 ENCOUNTER — Ambulatory Visit
Admission: RE | Admit: 2017-01-09 | Discharge: 2017-01-09 | Disposition: A | Discharge status: Home / Self Care | Payer: Medicare Other | Source: Ambulatory Visit | Attending: Radiation Oncology | Admitting: Radiation Oncology

## 2017-01-09 DIAGNOSIS — C8213 Follicular lymphoma grade II, intra-abdominal lymph nodes: Secondary | ICD-10-CM | POA: Diagnosis not present

## 2017-01-09 DIAGNOSIS — R531 Weakness: Secondary | ICD-10-CM | POA: Diagnosis not present

## 2017-01-09 DIAGNOSIS — Z87891 Personal history of nicotine dependence: Secondary | ICD-10-CM | POA: Diagnosis not present

## 2017-01-09 DIAGNOSIS — K5900 Constipation, unspecified: Secondary | ICD-10-CM | POA: Diagnosis not present

## 2017-01-09 DIAGNOSIS — G35 Multiple sclerosis: Secondary | ICD-10-CM | POA: Diagnosis not present

## 2017-01-09 DIAGNOSIS — Z510 Encounter for antineoplastic radiation therapy: Secondary | ICD-10-CM | POA: Diagnosis not present

## 2017-01-09 DIAGNOSIS — J449 Chronic obstructive pulmonary disease, unspecified: Secondary | ICD-10-CM | POA: Diagnosis not present

## 2017-01-09 NOTE — Patient Outreach (Signed)
[facility]) Care Management  01/09/2017  [name] [birth year] [id number]   Notified by [facility] CMA that patient had red EMMI alert re: medications. Noted that patient is currently residing in a Nursing facility and medications are being managed by facility staff.   This RNCM let patient [name] know that patient is in Skilled facility at this time.   Plan: will let [facility] RNCM community know when patient is discharged from facility.  [name]. [name] Purser, RN, BSN, [place] [phone]) Business Cell  [phone]) Toll Free Office

## 2017-01-09 NOTE — Patient Outreach (Signed)
This RN CM received an In basket from [facility] care management assistant that pt had one red flag- result of yesterday's Emmi call for general discharge follow up/pt was reached- did not know if had new prescriptions.  Pt discharged to [facility] 01/06/17 to which this RN CM does not follow while at a facility.    Plan:  RN CM to follow up at discharge from [facility].    Zara Chess.   [place] Management  [phone]

## 2017-01-15 ENCOUNTER — Other Ambulatory Visit: Payer: Self-pay | Admitting: Family Medicine

## 2017-01-16 ENCOUNTER — Telehealth: Payer: Self-pay | Admitting: Family Medicine

## 2017-01-23 ENCOUNTER — Other Ambulatory Visit: Payer: Self-pay | Admitting: *Deleted

## 2017-01-23 NOTE — Patient Outreach (Signed)
[facility]) Care Management  01/23/2017  [name] [birth year] [id number]   Met with [name], SW at [facility]. Patient will discharge home 01/24/17. [facility] will have [facility].  Plan to let [facility] know of patient discharge. [name]. [name], RN, BSN, [place] [phone]) Business Cell  [phone]) Toll Free Office

## 2017-01-27 ENCOUNTER — Encounter: Payer: Self-pay | Admitting: *Deleted

## 2017-01-27 ENCOUNTER — Other Ambulatory Visit: Payer: Self-pay | Admitting: *Deleted

## 2017-01-27 NOTE — Patient Outreach (Signed)
Successful telephone encounter to [name], 26 year of female- follow up on recent SNF discharge as this RN CM informed by Burgess Amor RN post acute care coordinator pt to discharge from [facility] 01/24/17, admitted after recent hospitalization September 6-10,2018 for UTI.   This RN CM provided pt with Community CM services prior to hospitalization.  Pt's history includes but not limited to MS, COPD, Hypertension, Bladder neurogenesis.  Spoke with pt, HIPAA identifiers verified, discussed purpose of call- follow up on recent SNF discharge.   Pt reports doing good since discharge home, taking all medications as ordered, thinks antibiotic was ordered post discharge.  RN CM unable to review medications with pt at this time with it being a  SNF discharge. RN CM inquired about follow up appointment with PCP to which pt requested to talk with friend [name]- per [name] pt's next PCP appointment is 03/17/17 to which RN CM discussed recommendation post discharge for PCP follow up is 7-10 days.  [name] reports will call PCP office as needs to also follow up on Home health orders. Spoke  With pt again, discussed urinary status, signs and symptoms to report to MD for recurrent UTI- pt voiced understanding.   Also discussed with pt to continue to follow for transition of care (weekly calls, a home visit).    Plan:  As discussed with pt, plan to follow up again next week- home visit           Plan to inform Dr. Sanda Klein to follow pt for transition of care- send by                In basket  [employer] discipline update letter.     Zara Chess.   [facility]  [phone]

## 2017-01-28 ENCOUNTER — Telehealth: Payer: Self-pay

## 2017-01-28 DIAGNOSIS — Z466 Encounter for fitting and adjustment of urinary device: Secondary | ICD-10-CM | POA: Diagnosis not present

## 2017-01-28 DIAGNOSIS — G35 Multiple sclerosis: Secondary | ICD-10-CM | POA: Diagnosis not present

## 2017-01-28 DIAGNOSIS — M5416 Radiculopathy, lumbar region: Secondary | ICD-10-CM | POA: Diagnosis not present

## 2017-01-28 DIAGNOSIS — Z87440 Personal history of urinary (tract) infections: Secondary | ICD-10-CM | POA: Diagnosis not present

## 2017-01-28 DIAGNOSIS — J449 Chronic obstructive pulmonary disease, unspecified: Secondary | ICD-10-CM | POA: Diagnosis not present

## 2017-01-28 DIAGNOSIS — G629 Polyneuropathy, unspecified: Secondary | ICD-10-CM | POA: Diagnosis not present

## 2017-01-28 DIAGNOSIS — M419 Scoliosis, unspecified: Secondary | ICD-10-CM | POA: Diagnosis not present

## 2017-01-28 DIAGNOSIS — I129 Hypertensive chronic kidney disease with stage 1 through stage 4 chronic kidney disease, or unspecified chronic kidney disease: Secondary | ICD-10-CM | POA: Diagnosis not present

## 2017-01-28 DIAGNOSIS — N183 Chronic kidney disease, stage 3 (moderate): Secondary | ICD-10-CM | POA: Diagnosis not present

## 2017-01-28 DIAGNOSIS — C8293 Follicular lymphoma, unspecified, intra-abdominal lymph nodes: Secondary | ICD-10-CM | POA: Diagnosis not present

## 2017-01-28 DIAGNOSIS — I739 Peripheral vascular disease, unspecified: Secondary | ICD-10-CM | POA: Diagnosis not present

## 2017-01-28 DIAGNOSIS — M1990 Unspecified osteoarthritis, unspecified site: Secondary | ICD-10-CM | POA: Diagnosis not present

## 2017-01-28 NOTE — Telephone Encounter (Signed)
Lorriane Shire, nurse with [facility] is asking for a verbal for her to have a home health aid 2 times a week this week and 3 times a week for the next two weeks. Call back number is [phone]

## 2017-01-28 NOTE — Telephone Encounter (Signed)
Yes, that's fine 

## 2017-01-29 DIAGNOSIS — Z466 Encounter for fitting and adjustment of urinary device: Secondary | ICD-10-CM | POA: Diagnosis not present

## 2017-01-29 DIAGNOSIS — G35 Multiple sclerosis: Secondary | ICD-10-CM | POA: Diagnosis not present

## 2017-01-29 DIAGNOSIS — I739 Peripheral vascular disease, unspecified: Secondary | ICD-10-CM | POA: Diagnosis not present

## 2017-01-29 DIAGNOSIS — I129 Hypertensive chronic kidney disease with stage 1 through stage 4 chronic kidney disease, or unspecified chronic kidney disease: Secondary | ICD-10-CM | POA: Diagnosis not present

## 2017-01-29 DIAGNOSIS — M5416 Radiculopathy, lumbar region: Secondary | ICD-10-CM | POA: Diagnosis not present

## 2017-01-29 DIAGNOSIS — G629 Polyneuropathy, unspecified: Secondary | ICD-10-CM | POA: Diagnosis not present

## 2017-01-29 DIAGNOSIS — C8293 Follicular lymphoma, unspecified, intra-abdominal lymph nodes: Secondary | ICD-10-CM | POA: Diagnosis not present

## 2017-01-29 DIAGNOSIS — N183 Chronic kidney disease, stage 3 (moderate): Secondary | ICD-10-CM | POA: Diagnosis not present

## 2017-01-29 DIAGNOSIS — M419 Scoliosis, unspecified: Secondary | ICD-10-CM | POA: Diagnosis not present

## 2017-01-29 DIAGNOSIS — Z87440 Personal history of urinary (tract) infections: Secondary | ICD-10-CM | POA: Diagnosis not present

## 2017-01-29 DIAGNOSIS — M1990 Unspecified osteoarthritis, unspecified site: Secondary | ICD-10-CM | POA: Diagnosis not present

## 2017-01-29 DIAGNOSIS — J449 Chronic obstructive pulmonary disease, unspecified: Secondary | ICD-10-CM | POA: Diagnosis not present

## 2017-01-29 NOTE — Telephone Encounter (Signed)
Left a detail message

## 2017-01-31 DIAGNOSIS — I739 Peripheral vascular disease, unspecified: Secondary | ICD-10-CM | POA: Diagnosis not present

## 2017-01-31 DIAGNOSIS — M419 Scoliosis, unspecified: Secondary | ICD-10-CM | POA: Diagnosis not present

## 2017-01-31 DIAGNOSIS — I129 Hypertensive chronic kidney disease with stage 1 through stage 4 chronic kidney disease, or unspecified chronic kidney disease: Secondary | ICD-10-CM | POA: Diagnosis not present

## 2017-01-31 DIAGNOSIS — Z466 Encounter for fitting and adjustment of urinary device: Secondary | ICD-10-CM | POA: Diagnosis not present

## 2017-01-31 DIAGNOSIS — M1990 Unspecified osteoarthritis, unspecified site: Secondary | ICD-10-CM | POA: Diagnosis not present

## 2017-01-31 DIAGNOSIS — N183 Chronic kidney disease, stage 3 (moderate): Secondary | ICD-10-CM | POA: Diagnosis not present

## 2017-01-31 DIAGNOSIS — M5416 Radiculopathy, lumbar region: Secondary | ICD-10-CM | POA: Diagnosis not present

## 2017-01-31 DIAGNOSIS — G629 Polyneuropathy, unspecified: Secondary | ICD-10-CM | POA: Diagnosis not present

## 2017-01-31 DIAGNOSIS — Z87440 Personal history of urinary (tract) infections: Secondary | ICD-10-CM | POA: Diagnosis not present

## 2017-01-31 DIAGNOSIS — C8293 Follicular lymphoma, unspecified, intra-abdominal lymph nodes: Secondary | ICD-10-CM | POA: Diagnosis not present

## 2017-01-31 DIAGNOSIS — G35 Multiple sclerosis: Secondary | ICD-10-CM | POA: Diagnosis not present

## 2017-01-31 DIAGNOSIS — J449 Chronic obstructive pulmonary disease, unspecified: Secondary | ICD-10-CM | POA: Diagnosis not present

## 2017-02-03 ENCOUNTER — Ambulatory Visit (INDEPENDENT_AMBULATORY_CARE_PROVIDER_SITE_OTHER): Payer: Medicare Other | Admitting: Family Medicine

## 2017-02-03 ENCOUNTER — Encounter: Payer: Self-pay | Admitting: Family Medicine

## 2017-02-03 ENCOUNTER — Ambulatory Visit: Payer: Medicare Other

## 2017-02-03 VITALS — BP 130/72 | HR 77 | Temp 98.0°F | Resp 16

## 2017-02-03 DIAGNOSIS — G35 Multiple sclerosis: Secondary | ICD-10-CM | POA: Diagnosis not present

## 2017-02-03 DIAGNOSIS — M79671 Pain in right foot: Secondary | ICD-10-CM | POA: Diagnosis not present

## 2017-02-03 DIAGNOSIS — Z466 Encounter for fitting and adjustment of urinary device: Secondary | ICD-10-CM | POA: Diagnosis not present

## 2017-02-03 DIAGNOSIS — M419 Scoliosis, unspecified: Secondary | ICD-10-CM | POA: Diagnosis not present

## 2017-02-03 DIAGNOSIS — Z23 Encounter for immunization: Secondary | ICD-10-CM

## 2017-02-03 DIAGNOSIS — I129 Hypertensive chronic kidney disease with stage 1 through stage 4 chronic kidney disease, or unspecified chronic kidney disease: Secondary | ICD-10-CM | POA: Diagnosis not present

## 2017-02-03 DIAGNOSIS — I739 Peripheral vascular disease, unspecified: Secondary | ICD-10-CM | POA: Diagnosis not present

## 2017-02-03 DIAGNOSIS — J449 Chronic obstructive pulmonary disease, unspecified: Secondary | ICD-10-CM | POA: Diagnosis not present

## 2017-02-03 DIAGNOSIS — N319 Neuromuscular dysfunction of bladder, unspecified: Secondary | ICD-10-CM | POA: Diagnosis not present

## 2017-02-03 DIAGNOSIS — N183 Chronic kidney disease, stage 3 (moderate): Secondary | ICD-10-CM | POA: Diagnosis not present

## 2017-02-03 DIAGNOSIS — G629 Polyneuropathy, unspecified: Secondary | ICD-10-CM | POA: Diagnosis not present

## 2017-02-03 DIAGNOSIS — M1990 Unspecified osteoarthritis, unspecified site: Secondary | ICD-10-CM | POA: Diagnosis not present

## 2017-02-03 DIAGNOSIS — B353 Tinea pedis: Secondary | ICD-10-CM | POA: Diagnosis not present

## 2017-02-03 DIAGNOSIS — M79672 Pain in left foot: Secondary | ICD-10-CM

## 2017-02-03 DIAGNOSIS — C8293 Follicular lymphoma, unspecified, intra-abdominal lymph nodes: Secondary | ICD-10-CM | POA: Diagnosis not present

## 2017-02-03 DIAGNOSIS — Z87440 Personal history of urinary (tract) infections: Secondary | ICD-10-CM | POA: Diagnosis not present

## 2017-02-03 DIAGNOSIS — M5416 Radiculopathy, lumbar region: Secondary | ICD-10-CM | POA: Diagnosis not present

## 2017-02-03 MED ORDER — HUGGIES PULL-UPS MISC: 1.0000 | Freq: Two times a day (BID) | 11 refills | Status: DC

## 2017-02-03 MED ORDER — TOLNAFTATE 1 % EX CREA: 1.0000 "application " | TOPICAL_CREAM | Freq: Every day | CUTANEOUS | 1 refills | Status: DC

## 2017-02-03 NOTE — Assessment & Plan Note (Addendum)
Refill pull ups, has catheter but has leakage; patient to talk to urologist about leakage

## 2017-02-03 NOTE — Patient Instructions (Addendum)
Please contact your social worker through [facility] and I will help any way that I can  Zara Chess. [place] Management  [phone] Please do wear your compression stockings You received the flu shot today; it should protect you against the flu virus over the coming months; it will take about two weeks for antibodies to develop; do try to stay away from hospitals, nursing homes, and daycares during peak flu season; taking extra vitamin C daily during flu season may help you avoid getting sick

## 2017-02-03 NOTE — Progress Notes (Signed)
BP 130/72   Pulse 77   Temp 98 F (36.7 C) (Oral)   Resp 16   SpO2 98%    Subjective:    Patient ID: [name], female    DOB: March 23, 1951, 66 y.o.   MRN: [id number]  HPI: [name] is a 66 y.o. female  Chief Complaint  Patient presents with  . Follow-up  . Medication Refill    Rx for pull-ups 2XL  . Referral    for podiatrist    HPI Patient is here with a friend Patient is interested in moving into a handicapped apartment She will requires assistance for ADLs and ADLs She has a walker Frequent falls Limited assistance at home by current caregiver, husband She has a really nice lift chair Affordable suites mentioned by friend She continues to have swelling in her left leg; has had several rounds of cellulitis and has had to have multiple ultrasounds of the leg to r/o DVT She has a urinary catheter, sees Dr. Bernardo Heater, urologist; sees him every 3 months, catheter changes once a month The nurse at home health does not want to change the catheter and will have that changed through urologist's office She is asking for a referral to a podiatrist; walking on flat feet all of her life; having some pain in her feet Urinary incontinence, secondary to [place]; needs 2XL and 2 a day Has leg edema; was given compression stockings and has them, not wearing them Lift chair at home already MS, Dr. Manuella Ghazi is her neurologist  Depression screen [facility] 2/9 02/04/2017 02/03/2017 11/11/2016 10/09/2016 08/02/2016  Decreased Interest 1 1 1  0 0  Down, Depressed, Hopeless 1 3 3  0 1  PHQ - 2 Score 2 4 4  0 1  Altered sleeping 0 1 - - -  Tired, decreased energy 3 3 3  - -  Change in appetite - 1 1 - -  Feeling bad or failure about yourself  0 - 1 - -  Trouble concentrating 0 1 1 - -  Moving slowly or fidgety/restless 1 0 1 - -  Suicidal thoughts 0 0 0 - -  PHQ-9 Score 6 10 - - -  Difficult doing work/chores Not difficult at all Somewhat difficult Somewhat difficult - -    Relevant past medical,  surgical, family and social history reviewed Past Medical History:  Diagnosis Date  . Abdominal aortic atherosclerosis ([place]) 11/11/2016  . ADHD   . Anxiety   . COPD (chronic obstructive pulmonary disease) ([place])   . Depression    major depressive  . Dyspnea    doe  . Edema    left leg  . Follicular lymphoma ([place])    B Cell  . Follicular lymphoma grade II ([place])   . Hypertension   . Hypotension    idiopathic  . Kyphoscoliosis and scoliosis 11/26/2011  . Morbid obesity ([place]) 01/05/2016  . Multiple sclerosis ([place])   . Multiple sclerosis ([place])    1980's  . Neuromuscular disorder ([place])   . Obstructive and reflux uropathy    foley  . Pain    atypical facial  . Peripheral vascular disease of lower extremity with ulceration ([place]) 11/08/2015  . Skin ulcer ([place]) 11/08/2015  . Weakness    generalized. has MS   Past Surgical History:  Procedure Laterality Date  . BACK SURGERY N/A 2002  . CYST EXCISION     lower back  . INGUINAL LYMPH NODE BIOPSY Left 07/04/2016   Procedure: INGUINAL LYMPH NODE  BIOPSY;  Surgeon: Christene Lye, MD;  Location: [facility] ORS;  Service: General;  Laterality: Left;  . PORTACATH PLACEMENT N/A 07/22/2016   Procedure: INSERTION PORT-A-CATH;  Surgeon: Christene Lye, MD;  Location: [facility] ORS;  Service: General;  Laterality: N/A;  . TONSILLECTOMY AND ADENOIDECTOMY    . TUBAL LIGATION     Family History  Problem Relation Age of Onset  . COPD Mother   . Diabetes Mother   . Heart failure Mother   . Alcohol abuse Father   . Kidney disease Father   . Kidney failure Father   . Arthritis Sister   . CAD Maternal Grandmother   . Stroke Maternal Grandfather   . Arthritis Sister   . Mental illness Sister   . Arthritis Brother    Social History   Social History  . Marital status: Married    Spouse name: N/A  . Number of children: 3  . Years of education: N/A   Occupational History  . disabled    Social History Main Topics  . Smoking status: Former  Smoker    Packs/day: 1.00    Years: 20.00    Types: Cigarettes    Quit date: 02/03/2016  . Smokeless tobacco: Never Used  . Alcohol use No  . Drug use: Yes    Types: Marijuana     Comment: smokes THC daily per pt   . Sexual activity: No   Other Topics Concern  . Not on file   Social History Narrative  . No narrative on file   Interim medical history since last visit reviewed. Allergies and medications reviewed  Review of Systems Per HPI unless specifically indicated above     Objective:    BP 130/72   Pulse 77   Temp 98 F (36.7 C) (Oral)   Resp 16   SpO2 98%   Wt Readings from Last 3 Encounters:  01/06/17 (!) 304 lb 12.8 oz (138.3 kg)  12/29/16 298 lb (135.2 kg)  12/11/16 298 lb 4.8 oz (135.3 kg)    Physical Exam  Constitutional: She appears well-developed and well-nourished. No distress.  Obese, weight not measured today  HENT:  Scab on the scalp  Eyes: No scleral icterus.  Neck: No thyromegaly present.  Cardiovascular: Normal rate and regular rhythm.   Pulmonary/Chest: Effort normal and breath sounds normal.  Abdominal: Bowel sounds are normal. She exhibits no distension.  Musculoskeletal: She exhibits no edema (wearing anti-embolism stockings).       Left lower leg: She exhibits swelling.  Erythema and increased warmth of the left leg, tight with peau d'orange changes; left leg is visibly larger than the right leg; no weeping or oozing but there is a scab laterally  Neurological:  Seated in wheelchair, gait not assessed  Skin: No pallor.  Psychiatric: She does not exhibit a depressed mood.  Not tearful, good eye contact with examiner, affect somewhat blunted   Results for orders placed or performed in visit on 01/07/17  CBC  Result Value Ref Range   WBC 6.1 3.6 - 11.0 K/uL   RBC 3.81 3.80 - 5.20 MIL/uL   Hemoglobin 11.0 (L) 12.0 - 16.0 g/dL   HCT 32.2 (L) 35.0 - 47.0 %   MCV 84.4 80.0 - 100.0 fL   MCH 28.8 26.0 - 34.0 pg   MCHC 34.1 32.0 - 36.0  g/dL   RDW 15.8 (H) 11.5 - 14.5 %   Platelets 287 150 - 440 K/uL      Assessment &  Plan:   Problem List Items Addressed This Visit      Nervous and Auditory   Multiple sclerosis ([place]) - Primary    stable        Musculoskeletal and Integument   Tinea pedis    Apply tolnaftate daily for one month, let feet air out      Relevant Medications   tolnaftate (TINACTIN) 1 % cream   Other Relevant Orders   Ambulatory referral to Podiatry     Other   Foot pain, bilateral    Refer to podiatrist; continue supportive shoes      Relevant Orders   Ambulatory referral to Podiatry   Bladder neurogenesis    Refill pull ups, has catheter but has leakage; patient to talk to urologist about leakage       Other Visit Diagnoses    Needs flu shot       Relevant Orders   Flu vaccine HIGH DOSE PF (Fluzone High dose) (Completed)   Need for pneumococcal vaccination       Relevant Orders   Pneumococcal conjugate vaccine 13-valent (Completed)       Follow up plan: No Follow-up on file.  An after-visit summary was printed and given to the patient at [facility].  Please see the patient instructions which may contain other information and recommendations beyond what is mentioned above in the assessment and plan.  Meds ordered this encounter  Medications  . hydrochlorothiazide (MICROZIDE) 12.5 MG capsule    Sig: Take 12.5 mg by mouth daily.    Refill:  5  . Diapers & Supplies (HUGGIES PULL-UPS) MISC    Sig: 1 each by Does not apply route 2 (two) times daily. Dx urinary incontinence, MS; LON 99 months    Dispense:  60 each    Refill:  11  . tolnaftate (TINACTIN) 1 % cream    Sig: Apply 1 application topically daily.    Dispense:  30 g    Refill:  1    Orders Placed This Encounter  Procedures  . Flu vaccine HIGH DOSE PF (Fluzone High dose)  . Pneumococcal conjugate vaccine 13-valent  . Ambulatory referral to Podiatry

## 2017-02-03 NOTE — Assessment & Plan Note (Signed)
stable °

## 2017-02-03 NOTE — Assessment & Plan Note (Signed)
Apply tolnaftate daily for one month, let feet air out

## 2017-02-03 NOTE — Assessment & Plan Note (Signed)
Refer to podiatrist; continue supportive shoes

## 2017-02-04 ENCOUNTER — Other Ambulatory Visit: Payer: Self-pay | Admitting: *Deleted

## 2017-02-04 ENCOUNTER — Telehealth: Payer: Self-pay | Admitting: Family Medicine

## 2017-02-04 NOTE — Patient Outreach (Addendum)
[facility]) Care Management   02/04/2017  [name] October 16, 1950 [id number]  [name] is an 66 y.o. female  Subjective: Pt reports on visit with PCP yesterday, lungs clear, given flu and pneumonia shots. Pt reports being referred to a Foot MD, currently has athlete's feet, still has prescription for the athlete's feet in her car, to get filled today.  Pt reports been taking Hydrochlorothiazide for years, Taking as ordered.  Pt reports no leaking from catheter, need to follow up with Urologist for  Catheter change as Home health no longer does it.  Pt reports on swelling in left leg, both she  And spouse have difficulty applying.   Objective:   Vitals:   02/04/17 1128  BP: 130/64  Pulse: 62  Resp: 16  SpO2: 97%    Review of Systems  Musculoskeletal: Negative.     Physical Exam  Constitutional: She is oriented to person, place, and time. She appears well-developed and well-nourished.  Cardiovascular: Regular rhythm and normal heart sounds.   HR 62.   Respiratory: Effort normal and breath sounds normal.  GI: Soft. Bowel sounds are normal.  Musculoskeletal: She exhibits edema.  Left leg- +1 lower leg/+2 top of foot.  Gait not observed, pt sitting in wheelchair   Neurological: She is alert and oriented to person, place, and time.  Skin: Skin is warm and dry.  Psychiatric: She has a normal mood and affect. Her behavior is normal. Judgment and thought content normal.    Encounter Medications:   Outpatient Encounter Prescriptions as of 02/04/2017  Medication Sig  . buPROPion (WELLBUTRIN XL) 300 MG 24 hr tablet Take 300 mg by mouth every morning.   . clonazePAM (KLONOPIN) 1 MG tablet Take 1 tablet (1 mg total) by mouth at bedtime.  . Diapers & Supplies (HUGGIES PULL-UPS) MISC 1 each by Does not apply route 2 (two) times daily. Dx urinary incontinence, MS; LON 99 months  . DULoxetine (CYMBALTA) 60 MG capsule Take 1 capsule (60 mg total) by mouth every  morning.  . fluticasone furoate-vilanterol (BREO ELLIPTA) 100-25 MCG/INH AEPB Inhale 1 puff into the lungs daily.  Marland Kitchen gabapentin (NEURONTIN) 600 MG tablet Take 0.5 tablets (300 mg total) by mouth 3 (three) times daily. (Patient taking differently: Take 600 mg by mouth 3 (three) times daily. )  . hydrochlorothiazide (MICROZIDE) 12.5 MG capsule Take 12.5 mg by mouth daily.  . interferon beta-1a (AVONEX) 30 MCG/0.5ML PSKT injection Inject 30 mcg into the muscle every Monday.  . lidocaine-prilocaine (EMLA) cream Apply 1 application topically as needed. apply cream over the port site and then place small piece of saran wrap over the cream to protect your clothing  . lisinopril (PRINIVIL,ZESTRIL) 5 MG tablet Take 1 tablet (5 mg total) by mouth daily.  . methylphenidate (RITALIN) 10 MG tablet Take 1 tablet (10 mg total) by mouth 2 (two) times daily.  . Multiple Vitamin (MULTIVITAMIN WITH MINERALS) TABS tablet Take 1 tablet by mouth daily.  . polyethylene glycol powder (GLYCOLAX/MIRALAX) powder Take 17 g by mouth daily as needed for mild constipation. Mixed in water  . QUEtiapine Fumarate (SEROQUEL XR) 150 MG 24 hr tablet Take 150 mg by mouth at bedtime.   . senna-docusate (SENOKOT-S) 8.6-50 MG tablet Take 1 tablet by mouth daily as needed for mild constipation.   . solifenacin (VESICARE) 10 MG tablet Take 10 mg by mouth daily.   Marland Kitchen tiZANidine (ZANAFLEX) 4 MG tablet Take 1 tablet (4 mg total) by mouth  4 (four) times daily. TAKES FOR MS  . tolnaftate (TINACTIN) 1 % cream Apply 1 application topically daily.  Marland Kitchen topiramate (TOPAMAX) 50 MG tablet Take 1 tablet (50 mg total) by mouth daily.  . vitamin C (ASCORBIC ACID) 500 MG tablet Take 500 mg by mouth daily.   Facility-Administered Encounter Medications as of 02/04/2017  Medication  . heparin lock flush 100 unit/mL    Functional Status:   In your present state of health, do you have any difficulty performing the following activities: 02/03/2017 01/02/2017   Hearing? N N  Vision? Y N  Difficulty concentrating or making decisions? N N  Walking or climbing stairs? N Y  Dressing or bathing? N Y  Doing errands, shopping? N Y  Conservation officer, nature and eating ? - -  Using the Toilet? - -  In the past six months, have you accidently leaked urine? - -  Comment - -  Do you have problems with loss of bowel control? - -  Managing your Medications? - -  Managing your Finances? - -  Housekeeping or managing your Housekeeping? - -  Some recent data might be hidden    Fall/Depression Screening:    Fall Risk  02/04/2017 02/03/2017 12/05/2016  Falls in the past year? Yes Yes Yes  Number falls in past yr: 2 or more 2 or more 2 or more  Injury with Fall? No No No  Risk Factor Category  - - High Fall Risk  Risk for fall due to : - - History of fall(s);Impaired balance/gait;Impaired mobility  Follow up Falls prevention discussed - Falls prevention discussed   PHQ 2/9 Scores 02/04/2017 02/03/2017 11/11/2016 10/09/2016 08/02/2016 06/18/2016 06/07/2016  PHQ - 2 Score 2 4 4  0 1 0 1  PHQ- 9 Score 6 10 - - - - -    Assessment:  Pleasant 66 year old female, resides with spouse.  This RN CM following for transition of  Care/recent SNF discharge, hospitalization for UTI.   Lungs clear, no complaints of sob, chest pain, Pain.    Urinary status (recent hospitalization - UTI) urine in catheter bag - clear yellow,no leakage reported      By pt or observed.   As discussed, pt to follow up with Urologist to have catheter changed.    Left leg edema:  +1 lower leg/+2 top of foot.  Applied pt's compression stockings- both legs.    Tinea pedis:  Reinforced with pt to have anti fungal medication (Tinactin)filled/apply to feet as        Ordered, air feet out - pt voiced understanding       Plan: As discussed with pt, plan to continue to follow for transition of care, coworker [name] RN CM  Covering for this RN CM to follow up next week telephonically.              Plan to send Dr. Sanda Klein  02/04/17 home visit encounter.   THN CM Care Plan Problem One     Most Recent Value  Care Plan Problem One  Risk for readmission related to recent SNF discharge/recent hospitalization for UTI   Role Documenting the Problem One  Care Management Coordinator  Care Plan for Problem One  Active  THN Long Term Goal   Pt would not readmit to SNF or hospital within the next 31 days   THN Long Term Goal Start Date  01/27/17  Interventions for Problem One Long Term Goal  Reinforced with pt importance of hydration- recent  UTI, follow up with Urologist for catheter changes   THN CM Short Term Goal #1   Pt would keep all MD appointments in the next 30 days   THN CM Short Term Goal #1 Start Date  01/27/17  Interventions for Short Term Goal #1  Reviewed with pt yesterday's PCP visit.   THN CM Short Term Goal #2   Pt would take all medications as ordered for the next 30 days   THN CM Short Term Goal #2 Start Date  01/27/17  Interventions for Short Term Goal #2  reinforced need with pt to have anti fungal medicaton filled/ take as ordered.       [name].   [place] [facility] Management  [phone]

## 2017-02-04 NOTE — Telephone Encounter (Signed)
[name] from [facility] verbals for nursing to mointor her edema so the cellulitis will not return: Once weekly for 4 weeks and 3 as needed [phone] it is okay to leave detail message on voicemail

## 2017-02-04 NOTE — Telephone Encounter (Signed)
That is a great idea; I agree; thank you

## 2017-02-05 DIAGNOSIS — J449 Chronic obstructive pulmonary disease, unspecified: Secondary | ICD-10-CM | POA: Diagnosis not present

## 2017-02-05 DIAGNOSIS — G629 Polyneuropathy, unspecified: Secondary | ICD-10-CM | POA: Diagnosis not present

## 2017-02-05 DIAGNOSIS — Z87440 Personal history of urinary (tract) infections: Secondary | ICD-10-CM | POA: Diagnosis not present

## 2017-02-05 DIAGNOSIS — M1990 Unspecified osteoarthritis, unspecified site: Secondary | ICD-10-CM | POA: Diagnosis not present

## 2017-02-05 DIAGNOSIS — Z466 Encounter for fitting and adjustment of urinary device: Secondary | ICD-10-CM | POA: Diagnosis not present

## 2017-02-05 DIAGNOSIS — M5416 Radiculopathy, lumbar region: Secondary | ICD-10-CM | POA: Diagnosis not present

## 2017-02-05 DIAGNOSIS — I739 Peripheral vascular disease, unspecified: Secondary | ICD-10-CM | POA: Diagnosis not present

## 2017-02-05 DIAGNOSIS — M419 Scoliosis, unspecified: Secondary | ICD-10-CM | POA: Diagnosis not present

## 2017-02-05 DIAGNOSIS — I129 Hypertensive chronic kidney disease with stage 1 through stage 4 chronic kidney disease, or unspecified chronic kidney disease: Secondary | ICD-10-CM | POA: Diagnosis not present

## 2017-02-05 DIAGNOSIS — N183 Chronic kidney disease, stage 3 (moderate): Secondary | ICD-10-CM | POA: Diagnosis not present

## 2017-02-05 DIAGNOSIS — C8293 Follicular lymphoma, unspecified, intra-abdominal lymph nodes: Secondary | ICD-10-CM | POA: Diagnosis not present

## 2017-02-05 DIAGNOSIS — G35 Multiple sclerosis: Secondary | ICD-10-CM | POA: Diagnosis not present

## 2017-02-05 NOTE — Telephone Encounter (Signed)
[name] has been notified.

## 2017-02-06 ENCOUNTER — Encounter: Payer: Self-pay | Admitting: Emergency Medicine

## 2017-02-06 ENCOUNTER — Inpatient Hospital Stay: Payer: Medicare Other

## 2017-02-06 ENCOUNTER — Emergency Department: Payer: Medicare Other

## 2017-02-06 ENCOUNTER — Inpatient Hospital Stay
Admission: EM | Admit: 2017-02-06 | Discharge: 2017-02-09 | DRG: 872 | Disposition: A | Discharge status: Home Health | Payer: Medicare Other | Attending: Internal Medicine | Admitting: Internal Medicine

## 2017-02-06 DIAGNOSIS — G35 Multiple sclerosis: Secondary | ICD-10-CM | POA: Diagnosis present

## 2017-02-06 DIAGNOSIS — M1990 Unspecified osteoarthritis, unspecified site: Secondary | ICD-10-CM | POA: Diagnosis not present

## 2017-02-06 DIAGNOSIS — B961 Klebsiella pneumoniae [K. pneumoniae] as the cause of diseases classified elsewhere: Secondary | ICD-10-CM | POA: Diagnosis present

## 2017-02-06 DIAGNOSIS — Y92009 Unspecified place in unspecified non-institutional (private) residence as the place of occurrence of the external cause: Secondary | ICD-10-CM | POA: Diagnosis not present

## 2017-02-06 DIAGNOSIS — E871 Hypo-osmolality and hyponatremia: Secondary | ICD-10-CM | POA: Diagnosis not present

## 2017-02-06 DIAGNOSIS — Z87440 Personal history of urinary (tract) infections: Secondary | ICD-10-CM | POA: Diagnosis not present

## 2017-02-06 DIAGNOSIS — Z833 Family history of diabetes mellitus: Secondary | ICD-10-CM | POA: Diagnosis not present

## 2017-02-06 DIAGNOSIS — R296 Repeated falls: Secondary | ICD-10-CM | POA: Diagnosis not present

## 2017-02-06 DIAGNOSIS — Z79899 Other long term (current) drug therapy: Secondary | ICD-10-CM

## 2017-02-06 DIAGNOSIS — W1830XA Fall on same level, unspecified, initial encounter: Secondary | ICD-10-CM | POA: Diagnosis present

## 2017-02-06 DIAGNOSIS — N289 Disorder of kidney and ureter, unspecified: Secondary | ICD-10-CM | POA: Diagnosis present

## 2017-02-06 DIAGNOSIS — R32 Unspecified urinary incontinence: Secondary | ICD-10-CM | POA: Diagnosis present

## 2017-02-06 DIAGNOSIS — I440 Atrioventricular block, first degree: Secondary | ICD-10-CM | POA: Diagnosis not present

## 2017-02-06 DIAGNOSIS — N183 Chronic kidney disease, stage 3 (moderate): Secondary | ICD-10-CM | POA: Diagnosis not present

## 2017-02-06 DIAGNOSIS — J449 Chronic obstructive pulmonary disease, unspecified: Secondary | ICD-10-CM | POA: Diagnosis not present

## 2017-02-06 DIAGNOSIS — I739 Peripheral vascular disease, unspecified: Secondary | ICD-10-CM | POA: Diagnosis present

## 2017-02-06 DIAGNOSIS — K5903 Drug induced constipation: Secondary | ICD-10-CM | POA: Diagnosis not present

## 2017-02-06 DIAGNOSIS — Z8249 Family history of ischemic heart disease and other diseases of the circulatory system: Secondary | ICD-10-CM

## 2017-02-06 DIAGNOSIS — N390 Urinary tract infection, site not specified: Secondary | ICD-10-CM

## 2017-02-06 DIAGNOSIS — F419 Anxiety disorder, unspecified: Secondary | ICD-10-CM | POA: Diagnosis present

## 2017-02-06 DIAGNOSIS — Z6841 Body Mass Index (BMI) 40.0 and over, adult: Secondary | ICD-10-CM

## 2017-02-06 DIAGNOSIS — A419 Sepsis, unspecified organism: Secondary | ICD-10-CM | POA: Diagnosis not present

## 2017-02-06 DIAGNOSIS — T83511A Infection and inflammatory reaction due to indwelling urethral catheter, initial encounter: Secondary | ICD-10-CM | POA: Diagnosis present

## 2017-02-06 DIAGNOSIS — I129 Hypertensive chronic kidney disease with stage 1 through stage 4 chronic kidney disease, or unspecified chronic kidney disease: Secondary | ICD-10-CM | POA: Diagnosis not present

## 2017-02-06 DIAGNOSIS — G629 Polyneuropathy, unspecified: Secondary | ICD-10-CM | POA: Diagnosis not present

## 2017-02-06 DIAGNOSIS — I959 Hypotension, unspecified: Secondary | ICD-10-CM | POA: Diagnosis not present

## 2017-02-06 DIAGNOSIS — M419 Scoliosis, unspecified: Secondary | ICD-10-CM | POA: Diagnosis not present

## 2017-02-06 DIAGNOSIS — F329 Major depressive disorder, single episode, unspecified: Secondary | ICD-10-CM | POA: Diagnosis present

## 2017-02-06 DIAGNOSIS — W19XXXA Unspecified fall, initial encounter: Secondary | ICD-10-CM | POA: Diagnosis not present

## 2017-02-06 DIAGNOSIS — I10 Essential (primary) hypertension: Secondary | ICD-10-CM | POA: Diagnosis not present

## 2017-02-06 DIAGNOSIS — R410 Disorientation, unspecified: Secondary | ICD-10-CM | POA: Diagnosis not present

## 2017-02-06 DIAGNOSIS — Z87891 Personal history of nicotine dependence: Secondary | ICD-10-CM | POA: Diagnosis not present

## 2017-02-06 DIAGNOSIS — Z841 Family history of disorders of kidney and ureter: Secondary | ICD-10-CM

## 2017-02-06 DIAGNOSIS — L0390 Cellulitis, unspecified: Secondary | ICD-10-CM | POA: Diagnosis not present

## 2017-02-06 DIAGNOSIS — Z466 Encounter for fitting and adjustment of urinary device: Secondary | ICD-10-CM | POA: Diagnosis not present

## 2017-02-06 DIAGNOSIS — L02416 Cutaneous abscess of left lower limb: Secondary | ICD-10-CM

## 2017-02-06 DIAGNOSIS — Z8572 Personal history of non-Hodgkin lymphomas: Secondary | ICD-10-CM

## 2017-02-06 DIAGNOSIS — C8293 Follicular lymphoma, unspecified, intra-abdominal lymph nodes: Secondary | ICD-10-CM | POA: Diagnosis not present

## 2017-02-06 DIAGNOSIS — I700 Atherosclerosis of aorta: Secondary | ICD-10-CM | POA: Diagnosis present

## 2017-02-06 DIAGNOSIS — Z811 Family history of alcohol abuse and dependence: Secondary | ICD-10-CM

## 2017-02-06 DIAGNOSIS — M5416 Radiculopathy, lumbar region: Secondary | ICD-10-CM | POA: Diagnosis not present

## 2017-02-06 DIAGNOSIS — Z8261 Family history of arthritis: Secondary | ICD-10-CM

## 2017-02-06 DIAGNOSIS — Z818 Family history of other mental and behavioral disorders: Secondary | ICD-10-CM

## 2017-02-06 DIAGNOSIS — Z823 Family history of stroke: Secondary | ICD-10-CM

## 2017-02-06 DIAGNOSIS — F909 Attention-deficit hyperactivity disorder, unspecified type: Secondary | ICD-10-CM | POA: Diagnosis present

## 2017-02-06 DIAGNOSIS — Z825 Family history of asthma and other chronic lower respiratory diseases: Secondary | ICD-10-CM | POA: Diagnosis not present

## 2017-02-06 DIAGNOSIS — E860 Dehydration: Secondary | ICD-10-CM | POA: Diagnosis present

## 2017-02-06 DIAGNOSIS — L03116 Cellulitis of left lower limb: Secondary | ICD-10-CM | POA: Diagnosis not present

## 2017-02-06 LAB — BASIC METABOLIC PANEL
Anion gap: 8 (ref 5–15)
BUN: 18 mg/dL (ref 6–20)
CO2: 24 mmol/L (ref 22–32)
Calcium: 8.9 mg/dL (ref 8.9–10.3)
Chloride: 97 mmol/L — ABNORMAL LOW (ref 101–111)
Creatinine, Ser: 1.52 mg/dL — ABNORMAL HIGH (ref 0.44–1.00)
GFR calc Af Amer: 40 mL/min — ABNORMAL LOW (ref >60)
GFR calc non Af Amer: 35 mL/min — ABNORMAL LOW (ref >60)
Glucose, Bld: 106 mg/dL — ABNORMAL HIGH (ref 65–99)
Potassium: 3.6 mmol/L (ref 3.5–5.1)
Sodium: 129 mmol/L — ABNORMAL LOW (ref 135–145)

## 2017-02-06 LAB — CBC
HCT: 30.8 % — ABNORMAL LOW (ref 35.0–47.0)
Hemoglobin: 10.5 g/dL — ABNORMAL LOW (ref 12.0–16.0)
MCH: 29.2 pg (ref 26.0–34.0)
MCHC: 34.1 g/dL (ref 32.0–36.0)
MCV: 85.6 fL (ref 80.0–100.0)
Platelets: 259 10*3/uL (ref 150–440)
RBC: 3.6 MIL/uL — ABNORMAL LOW (ref 3.80–5.20)
RDW: 17.3 % — ABNORMAL HIGH (ref 11.5–14.5)
WBC: 7.7 10*3/uL (ref 3.6–11.0)

## 2017-02-06 LAB — LACTIC ACID, PLASMA
Lactic Acid, Venous: 1.9 mmol/L (ref 0.5–1.9)
Lactic Acid, Venous: 1.9 mmol/L (ref 0.5–1.9)

## 2017-02-06 LAB — URINALYSIS, COMPLETE (UACMP) WITH MICROSCOPIC
Bacteria, UA: NONE SEEN
Bilirubin Urine: NEGATIVE
Glucose, UA: NEGATIVE mg/dL
Hgb urine dipstick: NEGATIVE
Ketones, ur: 5 mg/dL — AB
Nitrite: NEGATIVE
Protein, ur: 100 mg/dL — AB
Specific Gravity, Urine: 1.018 (ref 1.005–1.030)
pH: 5 (ref 5.0–8.0)

## 2017-02-06 LAB — HEPATIC FUNCTION PANEL
ALT: 17 U/L (ref 14–54)
AST: 19 U/L (ref 15–41)
Albumin: 3.2 g/dL — ABNORMAL LOW (ref 3.5–5.0)
Alkaline Phosphatase: 64 U/L (ref 38–126)
Bilirubin, Direct: <0.1 mg/dL — ABNORMAL LOW (ref 0.1–0.5)
Total Bilirubin: 0.5 mg/dL (ref 0.3–1.2)
Total Protein: 5.8 g/dL — ABNORMAL LOW (ref 6.5–8.1)

## 2017-02-06 LAB — TROPONIN I: Troponin I: <0.03 ng/mL (ref <0.03)

## 2017-02-06 LAB — AMMONIA: Ammonia: <9 umol/L — ABNORMAL LOW (ref 9–35)

## 2017-02-06 MED ORDER — TOPIRAMATE 25 MG PO TABS
50.0000 mg | ORAL_TABLET | Freq: Every day | ORAL | Status: DC
  Administered 2017-02-07 – 2017-02-09 (×3): 50 mg via ORAL
  Filled 2017-02-06 (×3): qty 2

## 2017-02-06 MED ORDER — GABAPENTIN 600 MG PO TABS
300.0000 mg | ORAL_TABLET | Freq: Once | ORAL | Status: AC
  Administered 2017-02-06: 300 mg via ORAL

## 2017-02-06 MED ORDER — VITAMIN C 500 MG PO TABS
500.0000 mg | ORAL_TABLET | Freq: Every day | ORAL | Status: DC
  Administered 2017-02-07 – 2017-02-09 (×3): 500 mg via ORAL
  Filled 2017-02-06 (×3): qty 1

## 2017-02-06 MED ORDER — CLONAZEPAM 0.5 MG PO TABS
1.0000 mg | ORAL_TABLET | Freq: Every day | ORAL | Status: DC
  Administered 2017-02-07 – 2017-02-08 (×3): 1 mg via ORAL
  Filled 2017-02-06 (×3): qty 2

## 2017-02-06 MED ORDER — ONDANSETRON HCL 4 MG/2ML IJ SOLN: 4.0000 mg | Freq: Four times a day (QID) | INTRAMUSCULAR | Status: DC | PRN

## 2017-02-06 MED ORDER — ADULT MULTIVITAMIN W/MINERALS CH
1.0000 | ORAL_TABLET | Freq: Every day | ORAL | Status: DC
  Administered 2017-02-07 – 2017-02-09 (×3): 1 via ORAL
  Filled 2017-02-06 (×3): qty 1

## 2017-02-06 MED ORDER — ACETAMINOPHEN 650 MG RE SUPP: 650.0000 mg | Freq: Four times a day (QID) | RECTAL | Status: DC | PRN

## 2017-02-06 MED ORDER — GABAPENTIN 600 MG PO TABS
300.0000 mg | ORAL_TABLET | Freq: Three times a day (TID) | ORAL | Status: DC
  Administered 2017-02-07 – 2017-02-09 (×8): 300 mg via ORAL
  Filled 2017-02-06 (×8): qty 1

## 2017-02-06 MED ORDER — ENOXAPARIN SODIUM 40 MG/0.4ML ~~LOC~~ SOLN
40.0000 mg | Freq: Two times a day (BID) | SUBCUTANEOUS | Status: DC
  Administered 2017-02-07 – 2017-02-09 (×6): 40 mg via SUBCUTANEOUS
  Filled 2017-02-06 (×6): qty 0.4

## 2017-02-06 MED ORDER — SODIUM CHLORIDE 0.9 % IV SOLN
INTRAVENOUS | Status: DC
  Administered 2017-02-07 – 2017-02-08 (×4): via INTRAVENOUS

## 2017-02-06 MED ORDER — QUETIAPINE FUMARATE ER 50 MG PO TB24
150.0000 mg | ORAL_TABLET | Freq: Every day | ORAL | Status: DC
  Administered 2017-02-07 – 2017-02-08 (×3): 150 mg via ORAL
  Filled 2017-02-06 (×4): qty 3

## 2017-02-06 MED ORDER — BUPROPION HCL ER (XL) 300 MG PO TB24
300.0000 mg | ORAL_TABLET | ORAL | Status: DC
  Administered 2017-02-07 – 2017-02-09 (×3): 300 mg via ORAL
  Filled 2017-02-06 (×3): qty 1

## 2017-02-06 MED ORDER — SODIUM CHLORIDE 0.9 % IV BOLUS (SEPSIS)
2000.0000 mL | Freq: Once | INTRAVENOUS | Status: AC
  Administered 2017-02-06: 2000 mL via INTRAVENOUS

## 2017-02-06 MED ORDER — CIPROFLOXACIN IN D5W 400 MG/200ML IV SOLN: 400.0000 mg | Freq: Two times a day (BID) | INTRAVENOUS | Status: DC

## 2017-02-06 MED ORDER — ACETAMINOPHEN 325 MG PO TABS: 650.0000 mg | ORAL_TABLET | Freq: Four times a day (QID) | ORAL | Status: DC | PRN

## 2017-02-06 MED ORDER — FLUTICASONE FUROATE-VILANTEROL 100-25 MCG/INH IN AEPB
1.0000 | INHALATION_SPRAY | Freq: Every day | RESPIRATORY_TRACT | Status: DC
  Administered 2017-02-07 – 2017-02-09 (×3): 1 via RESPIRATORY_TRACT
  Filled 2017-02-06: qty 28

## 2017-02-06 MED ORDER — DULOXETINE HCL 30 MG PO CPEP
60.0000 mg | ORAL_CAPSULE | ORAL | Status: DC
  Administered 2017-02-07 – 2017-02-09 (×3): 60 mg via ORAL
  Filled 2017-02-06 (×3): qty 2

## 2017-02-06 MED ORDER — HYDROCODONE-ACETAMINOPHEN 5-325 MG PO TABS: 1.0000 | ORAL_TABLET | ORAL | Status: DC | PRN

## 2017-02-06 MED ORDER — LIDOCAINE-PRILOCAINE 2.5-2.5 % EX CREA
1.0000 "application " | TOPICAL_CREAM | CUTANEOUS | Status: DC | PRN
  Filled 2017-02-06: qty 5

## 2017-02-06 MED ORDER — POLYETHYLENE GLYCOL 3350 17 GM/SCOOP PO POWD: 17.0000 g | Freq: Every day | ORAL | Status: DC | PRN

## 2017-02-06 MED ORDER — TOLNAFTATE 1 % EX CREA
1.0000 "application " | TOPICAL_CREAM | Freq: Every day | CUTANEOUS | Status: DC
  Filled 2017-02-06: qty 30

## 2017-02-06 MED ORDER — CIPROFLOXACIN IN D5W 400 MG/200ML IV SOLN
400.0000 mg | Freq: Two times a day (BID) | INTRAVENOUS | Status: DC
  Administered 2017-02-07: 06:00:00 400 mg via INTRAVENOUS
  Filled 2017-02-06 (×2): qty 200

## 2017-02-06 MED ORDER — DARIFENACIN HYDROBROMIDE ER 7.5 MG PO TB24
7.5000 mg | ORAL_TABLET | Freq: Every day | ORAL | Status: DC
  Administered 2017-02-07 – 2017-02-09 (×3): 7.5 mg via ORAL
  Filled 2017-02-06 (×3): qty 1

## 2017-02-06 MED ORDER — ONDANSETRON HCL 4 MG PO TABS: 4.0000 mg | ORAL_TABLET | Freq: Four times a day (QID) | ORAL | Status: DC | PRN

## 2017-02-06 MED ORDER — CIPROFLOXACIN IN D5W 400 MG/200ML IV SOLN
400.0000 mg | Freq: Once | INTRAVENOUS | Status: AC
  Administered 2017-02-06: 400 mg via INTRAVENOUS
  Filled 2017-02-06: qty 200

## 2017-02-06 MED ORDER — METHYLPHENIDATE HCL 5 MG PO TABS
10.0000 mg | ORAL_TABLET | Freq: Two times a day (BID) | ORAL | Status: DC
  Administered 2017-02-07 – 2017-02-09 (×5): 10 mg via ORAL
  Filled 2017-02-06 (×5): qty 2

## 2017-02-06 MED ORDER — TIZANIDINE HCL 4 MG PO TABS
4.0000 mg | ORAL_TABLET | Freq: Four times a day (QID) | ORAL | Status: DC
  Administered 2017-02-07 – 2017-02-09 (×9): 4 mg via ORAL
  Filled 2017-02-06 (×13): qty 1

## 2017-02-06 NOTE — ED Triage Notes (Signed)
Pt to ED via EMS from home c/o fall today.  States was showering and getting help into a seated walker when her legs got weak causing her to fall.  Denies pain.  Was also told by family members to come get checked for possible UTI.  Patient has hx of MS and also a foley in place placed by recent rehab.

## 2017-02-06 NOTE — ED Provider Notes (Signed)
[facility] Emergency Department Provider Note  ____________________________________________  Time seen: Approximately 5:03 PM  I have reviewed the triage vital signs and the nursing notes.   HISTORY  Chief Complaint Fall    HPI [name] is a 66 y.o. female with a history of MS and indwelling Foley catheter, recent rehabilitation stay, follicular B-cell lymphoma presenting with decreased responsiveness, fall. The patient is accompanied by her best friend and both of them give the history together. The patient was recently discharged from rehabilitation 2-3 weeks ago, and initially was doing well. Today, she was sitting on a chair after her shower, and listed over towards the left, falling down but not injuring herself, hitting her head or losing consciousness. She thinks that she just lost her balance and was not able to move her left foot well.over the past few days, the patient has been noted to be somnolent, "fading in and out" during the day, with increasing sediment and thickness from her Foley catheter.She has not had any fever, chills, nausea vomiting or diarrhea. No cough or cold symptoms, chest pain or shortness of breath.  The patient is hypotensive in the emergency department; she was started on lisinopril during her last rehabilitation stay but has not been having any hypotension at home.   Past Medical History:  Diagnosis Date  . Abdominal aortic atherosclerosis ([place]) 11/11/2016  . ADHD   . Anxiety   . COPD (chronic obstructive pulmonary disease) ([place])   . Depression    major depressive  . Dyspnea    doe  . Edema    left leg  . Follicular lymphoma ([place])    B Cell  . Follicular lymphoma grade II ([place])   . Hypertension   . Hypotension    idiopathic  . Kyphoscoliosis and scoliosis 11/26/2011  . Morbid obesity ([place]) 01/05/2016  . Multiple sclerosis ([place])   . Multiple sclerosis ([place])    1980's  . Neuromuscular disorder ([place])   . Obstructive  and reflux uropathy    foley  . Pain    atypical facial  . Peripheral vascular disease of lower extremity with ulceration ([place]) 11/08/2015  . Skin ulcer ([place]) 11/08/2015  . Weakness    generalized. has MS    Patient Active Problem List   Diagnosis Date Noted  . Foot pain, bilateral 02/03/2017  . Tinea pedis 02/03/2017  . UTI (urinary tract infection) 01/02/2017  . Cellulitis and abscess of leg 12/29/2016  . Left ovarian cyst 12/09/2016  . Abdominal aortic atherosclerosis ([place]) 11/11/2016  . Cellulitis of left lower extremity 10/11/2016  . Swelling of lower extremity 10/11/2016  . Obstructive sleep apnea 10/11/2016  . Bilateral lower leg cellulitis 10/11/2016  . Follicular lymphoma of intra-abdominal lymph nodes ([place]) 08/06/2016  . Multiple falls 06/07/2016  . Inguinal adenopathy 05/29/2016  . Breast cancer screening 01/05/2016  . Morbid obesity ([place]) 01/05/2016  . Pressure ulcer 12/19/2015  . Low HDL (under 40) 12/19/2015  . Cellulitis 12/18/2015  . Skin ulcer ([place]) 11/08/2015  . Peripheral vascular disease of lower extremity with ulceration ([place]) 11/08/2015  . CKD (chronic kidney disease) stage 3, GFR 30-59 ml/min ([facility]) 11/08/2015  . Decubitus ulcer of left thigh, stage 2 04/18/2015  . Constipation due to pain medication 01/31/2015  . Obstructive sleep apnea of adult 01/13/2015  . Pelvic muscle wasting 01/13/2015  . Incomplete bladder emptying 01/13/2015  . Headache, migraine 10/24/2014  . 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 Current tobacco use 10/24/2014  . Lumbar radiculopathy,  chronic 10/02/2013  . COPD with bronchial hyperresponsiveness ([place]) 10/02/2013  . Major depressive disorder, recurrent, in partial remission ([place]) 10/02/2013  . Hypertension goal BP (blood pressure) < 140/90 10/02/2013  . Multiple sclerosis ([place]) 10/02/2013  . Absence of bladder continence 09/25/2012  . Acontractile bladder 02/12/2012  . Bladder retention 02/12/2012  . Narrowing of  intervertebral disc space 11/26/2011  . Kyphoscoliosis and scoliosis 11/26/2011    Past Surgical History:  Procedure Laterality Date  . BACK SURGERY N/A 2002  . CYST EXCISION     lower back  . INGUINAL LYMPH NODE BIOPSY Left 07/04/2016   Procedure: INGUINAL LYMPH NODE BIOPSY;  Surgeon: Christene Lye, MD;  Location: [facility] ORS;  Service: General;  Laterality: Left;  . PORTACATH PLACEMENT N/A 07/22/2016   Procedure: INSERTION PORT-A-CATH;  Surgeon: Christene Lye, MD;  Location: [facility] ORS;  Service: General;  Laterality: N/A;  . TONSILLECTOMY AND ADENOIDECTOMY    . TUBAL LIGATION      Current Outpatient Rx  . Order #: [id number] Class: Historical Med  . Order #: [id number] Class: Historical Med  . Order #: [id number] Class: Print  . Order #: [id number] Class: Print  . Order #: [id number] Class: Print  . Order #: [id number] Class: Normal  . Order #: [id number] Class: No Print  . Order #: [id number] Class: Historical Med  . Order #: [id number] Class: Historical Med  . Order #: [id number] Class: Print  . Order #: [id number] Class: Normal  . Order #: [id number] Class: Print  . Order #: [id number] Class: Historical Med  . Order #: [id number] Class: Historical Med  . Order #: [id number] Class: No Print  . Order #: [id number] Class: Historical Med  . Order #: [id number] Class: Historical Med  . Order #: [id number] Class: Historical Med  . Order #: [id number] Class: Print  . Order #: [id number] Class: Normal  . Order #: [id number] Class: Print  . Order #: [id number] Class: Historical Med    Allergies Patient has no known allergies.  Family History  Problem Relation Age of Onset  . COPD Mother   . Diabetes Mother   . Heart failure Mother   . Alcohol abuse Father   . Kidney disease Father   . Kidney failure Father   . Arthritis Sister   . CAD Maternal Grandmother   . Stroke Maternal Grandfather   . Arthritis Sister   . Mental illness Sister   . Arthritis Brother     Social History Social History   Substance Use Topics  . Smoking status: Former Smoker    Packs/day: 1.00    Years: 20.00    Types: Cigarettes    Quit date: 02/03/2016  . Smokeless tobacco: Never Used  . Alcohol use No    Review of Systems Constitutional: No fever/chills.no lightheadedness or syncope. Positive altered mental status with decreased responsiveness. Eyes: No visual changes. No eye discharge. ENT: No sore throat. No congestion or rhinorrhea. Cardiovascular: Denies chest pain. Denies palpitations. Respiratory: Denies shortness of breath.  No cough. Gastrointestinal: No abdominal pain.  No nausea, no vomiting.  No diarrhea.  No constipation. Genitourinary: Negative for dysuria.positive change in Foley catheter output. Musculoskeletal: Negative for back pain.chronic left lower extremity swelling. Patient states that this is unchanged. Skin: Negative for rash. Neurological: Negative for headaches. No focal numbness, tingling or weakness. Positive increased somnolence.    ____________________________________________   PHYSICAL EXAM:  VITAL SIGNS: ED Triage Vitals [02/06/17 1642]  Enc Vitals Group     BP      Pulse      Resp      Temp  Temp src      SpO2      Weight 280 lb (127 kg)     Height 5\' 4"  (1.626 m)     Head Circumference      Peak Flow      Pain Score 0     Pain Loc      Pain Edu?      Excl. in [place]?     Constitutional: Alert and oriented. Chronically ill-appearing but in no acute distress. Answers questions appropriately. The patient is occasionally somnolent but arouses to voice. GCS is 15. Eyes: Conjunctivae are normal.  EOMI. No scleral icterus.no eye discharge. Head: Atraumatic. Nose: No congestion/rhinnorhea. Mouth/Throat: Mucous membranes are dry.  Neck: No stridor.  Supple.  o JVD. No meningismus. Cardiovascular: Normal rate, regular rhythm. No murmurs, rubs or gallops.  Respiratory: Normal respiratory effort.  No accessory muscle use or retractions. Lungs CTAB.  No  wheezes, rales or ronchi. Gastrointestinal: morbidly obese.Soft, nontender and nondistended.  No guarding or rebound.  No peritoneal signs. Genitourinary: indwelling Foley catheter that has urine that is thick with significant sediment. Musculoskeletal: Unilateral left lower extremity swelling without overlying erythema or warmth. The patient states that her leg appears "better than usual," and that she has had swelling for years. No ttp in the calves or palpable cords.  Negative Homan's sign. Neurologic:  A&Ox3.  Speech is clear although the patient is somnolent..  Face and smile are symmetric.  EOMI.  Moves all extremities well. Skin:  Skin is warm, dry and intact. No rash noted. Psychiatric: Mood and affect are normal. Speech and behavior are normal.  Normal judgement.  ____________________________________________   LABS (all labs ordered are listed, but only abnormal results are displayed)  Labs Reviewed  CBC - Abnormal; Notable for the following:       Result Value   RBC 3.60 (*)    Hemoglobin 10.5 (*)    HCT 30.8 (*)    RDW 17.3 (*)    All other components within normal limits  URINALYSIS, COMPLETE (UACMP) WITH MICROSCOPIC - Abnormal; Notable for the following:    Color, Urine AMBER (*)    APPearance HAZY (*)    Ketones, ur 5 (*)    Protein, ur 100 (*)    Leukocytes, UA LARGE (*)    Squamous Epithelial / LPF 0-5 (*)    Non Squamous Epithelial 0-5 (*)    All other components within normal limits  BLOOD GAS, VENOUS - Abnormal; Notable for the following:    Bicarbonate 28.5 (*)    Acid-Base Excess 3.0 (*)    All other components within normal limits  AMMONIA - Abnormal; Notable for the following:    Ammonia <9 (*)    All other components within normal limits  BASIC METABOLIC PANEL - Abnormal; Notable for the following:    Sodium 129 (*)    Chloride 97 (*)    Glucose, Bld 106 (*)    Creatinine, Ser 1.52 (*)    GFR calc non Af Amer 35 (*)    GFR calc Af Amer 40 (*)    All  other components within normal limits  CULTURE, BLOOD (ROUTINE X 2)  CULTURE, BLOOD (ROUTINE X 2)  URINE CULTURE  LACTIC ACID, PLASMA  URINALYSIS, ROUTINE W REFLEX MICROSCOPIC  LACTIC ACID, PLASMA  TROPONIN I  HEPATIC FUNCTION PANEL   ____________________________________________  EKG  ED ECG REPORT I, Eula Listen, the attending physician, personally viewed and interpreted this ECG.   Date:  10/11/20165718  EKG Time:   Rate: 66  Rhythm: normal sinus rhythm  Axis: leftward  Intervals:first-degree A-V block   ST&T Change: no STEMI  ____________________________________________  RADIOLOGY  Dg Chest Port 1 View  Result Date: 02/06/2017 CLINICAL DATA:  Weakness and multiple falls x3 days. Hx of COPD, HTN. Former smoker. EXAM: PORTABLE CHEST - 1 VIEW COMPARISON:  07/22/2016 FINDINGS: Lungs are clear.  Low lung volumes. Heart size and mediastinal contours are within normal limits. Left IJ port catheter tip has retracted into the left innominate vein. No pneumothorax. No effusion. Visualized bones unremarkable. IMPRESSION: 1. No acute cardiopulmonary disease. 2. Left IJ port catheter tip has retracted into the left innominate vein. Electronically Signed   By: Lucrezia Europe M.D.   On: 02/06/2017 17:57    ____________________________________________   PROCEDURES  Procedure(s) performed: None  Procedures  Critical Care performed: No ____________________________________________   INITIAL IMPRESSION / ASSESSMENT AND PLAN / ED COURSE  Pertinent labs & imaging results that were available during my care of the patient were reviewed by me and considered in my medical decision making (see chart for details).  66 y.o. Female with a history of MS and B-cell lymphoma, not on chemotherapy, presenting with somnolence, change in urine output. Overall, I am concerned about UTI and sepsis, given that the patient is hypotensive with a blood pressure of 95 over 70s here. I have ordered  ciprofloxacin, given the patient's last microbiology results which showed pseudomonas sensitive to Cipro. She has also recently been started on lisinopril, so her hypotension could come from that but it is less likely given that she has been having normal blood pressures prior to this. We will get a CT of the head to rule out any acute reasons for the patient's fall or injury from her fall. We will check electrolytes. Intravenous fluids will be given for the patient's hypotension and she'l be reevaluated. Plan admission.  ----------------------------------------- 6:51 PM on 02/06/2017 -----------------------------------------  I have reviewed the patient's medical chart.  At this time, the patient does have a UA consistent with UTI and has been given ciprofloxacin based on her last microbiology results of pansensitive pseudomonas. She also has hyponatremia and renal insufficiency, likely from dehydration. The patient's hypotension has resolved with intravenous fluids.  I will plan admission at this time.  ____________________________________________  FINAL CLINICAL IMPRESSION(S) / ED DIAGNOSES  Final diagnoses:  Hyponatremia  Renal insufficiency  Urinary tract infection without hematuria, site unspecified  Sepsis, due to unspecified organism [facility])         NEW MEDICATIONS STARTED DURING THIS VISIT:  New Prescriptions   No medications on file      Eula Listen, MD 02/06/17 1902

## 2017-02-06 NOTE — H&P (Signed)
[facility] NAME: [name]    MR#:  [id number]  DATE OF BIRTH:  01-05-1951  DATE OF ADMISSION:  02/06/2017  PRIMARY CARE PHYSICIAN: Arnetha Courser, MD   REQUESTING/REFERRING PHYSICIAN: Marsa Aris MD  CHIEF COMPLAINT:   Chief Complaint  Patient presents with  . Fall    HISTORY OF PRESENT ILLNESS: [name]  is a 66 y.o. female with a known history of  Multiple sclerosis, COPD, depression, chronic lower extremity edema , essential hypertension, morbid obesity and peripheral vascular disease who was recently in rehabilitation and was discharged to 3 weeks ago who has been having falls for the past 3 days. She states that she is using a walker to walk. However whenever she walks she loses balance and falls. Patient presented to the ER with these symptoms and was noted to have a urinary tract infection. She does have a chronic indwelling Foley. She has urine cultures done from September which showed Pseudomonas in her urine. Patient has not had any fevers chills no chest pains palpitations To note patient's blood pressure was low     PAST MEDICAL HISTORY:   Past Medical History:  Diagnosis Date  . Abdominal aortic atherosclerosis ([place]) 11/11/2016  . ADHD   . Anxiety   . COPD (chronic obstructive pulmonary disease) ([place])   . Depression    major depressive  . Dyspnea    doe  . Edema    left leg  . Follicular lymphoma ([place])    B Cell  . Follicular lymphoma grade II ([place])   . Hypertension   . Hypotension    idiopathic  . Kyphoscoliosis and scoliosis 11/26/2011  . Morbid obesity ([place]) 01/05/2016  . Multiple sclerosis ([place])   . Multiple sclerosis ([place])    1980's  . Neuromuscular disorder ([place])   . Obstructive and reflux uropathy    foley  . Pain    atypical facial  . Peripheral vascular disease of lower extremity with ulceration ([place]) 11/08/2015  . Skin ulcer ([place]) 11/08/2015  . Weakness    generalized. has MS    PAST  SURGICAL HISTORY: Past Surgical History:  Procedure Laterality Date  . BACK SURGERY N/A 2002  . CYST EXCISION     lower back  . INGUINAL LYMPH NODE BIOPSY Left 07/04/2016   Procedure: INGUINAL LYMPH NODE BIOPSY;  Surgeon: Christene Lye, MD;  Location: [facility] ORS;  Service: General;  Laterality: Left;  . PORTACATH PLACEMENT N/A 07/22/2016   Procedure: INSERTION PORT-A-CATH;  Surgeon: Christene Lye, MD;  Location: [facility] ORS;  Service: General;  Laterality: N/A;  . TONSILLECTOMY AND ADENOIDECTOMY    . TUBAL LIGATION      SOCIAL HISTORY:  Social History  Substance Use Topics  . Smoking status: Former Smoker    Packs/day: 1.00    Years: 20.00    Types: Cigarettes    Quit date: 02/03/2016  . Smokeless tobacco: Never Used  . Alcohol use No    FAMILY HISTORY:  Family History  Problem Relation Age of Onset  . COPD Mother   . Diabetes Mother   . Heart failure Mother   . Alcohol abuse Father   . Kidney disease Father   . Kidney failure Father   . Arthritis Sister   . CAD Maternal Grandmother   . Stroke Maternal Grandfather   . Arthritis Sister   . Mental illness Sister   . Arthritis Brother     DRUG  ALLERGIES: No Known Allergies  REVIEW OF SYSTEMS:   CONSTITUTIONAL: No fever,Positive fatigue or weakness.  EYES: No blurred or double vision.  EARS, NOSE, AND THROAT: No tinnitus or ear pain.  RESPIRATORY: No cough, shortness of breath, wheezing or hemoptysis.  CARDIOVASCULAR: No chest pain, orthopnea, positive edema.  GASTROINTESTINAL: No nausea, vomiting, diarrhea or abdominal pain.  GENITOURINARY: No dysuria, hematuria.  ENDOCRINE: No polyuria, nocturia,  HEMATOLOGY: No anemia, easy bruising or bleeding SKIN: No rash or lesion. MUSCULOSKELETAL: No joint pain or arthritis.   NEUROLOGIC: No tingling, numbness, weakness.  PSYCHIATRY: No anxiety or depression.   MEDICATIONS AT HOME:  Prior to Admission medications   Medication Sig Start Date End Date Taking?  Authorizing Provider  buPROPion (WELLBUTRIN XL) 300 MG 24 hr tablet Take 300 mg by mouth every morning.  05/28/16  Yes [provider]  clonazePAM (KLONOPIN) 1 MG tablet Take 1 tablet (1 mg total) by mouth at bedtime. 01/06/17  Yes Sudini, Alveta Heimlich, MD  DULoxetine (CYMBALTA) 60 MG capsule Take 1 capsule (60 mg total) by mouth every morning. 10/14/16  Yes Vaughan Basta, MD  gabapentin (NEURONTIN) 600 MG tablet Take 0.5 tablets (300 mg total) by mouth 3 (three) times daily. Patient taking differently: Take 600 mg by mouth 3 (three) times daily.  12/30/16  Yes Mody, Ulice Bold, MD  hydrochlorothiazide (MICROZIDE) 12.5 MG capsule Take 12.5 mg by mouth daily. 12/19/16  Yes [provider]  interferon beta-1a (AVONEX) 30 MCG/0.5ML PSKT injection Inject 30 mcg into the muscle every Monday.   Yes [provider]  lisinopril (PRINIVIL,ZESTRIL) 5 MG tablet Take 1 tablet (5 mg total) by mouth daily. 11/13/16  Yes Lada, Satira Anis, MD  methylphenidate (RITALIN) 10 MG tablet Take 1 tablet (10 mg total) by mouth 2 (two) times daily. 10/14/16  Yes Vaughan Basta, MD  Multiple Vitamin (MULTIVITAMIN WITH MINERALS) TABS tablet Take 1 tablet by mouth daily.   Yes [provider]  QUEtiapine Fumarate (SEROQUEL XR) 150 MG 24 hr tablet Take 150 mg by mouth at bedtime.    Yes [provider]  solifenacin (VESICARE) 10 MG tablet Take 10 mg by mouth daily.    Yes [provider]  tiZANidine (ZANAFLEX) 4 MG tablet Take 1 tablet (4 mg total) by mouth 4 (four) times daily. TAKES FOR MS 10/14/16  Yes Vaughan Basta, MD  topiramate (TOPAMAX) 50 MG tablet Take 1 tablet (50 mg total) by mouth daily. 10/14/16  Yes Vaughan Basta, MD  vitamin C (ASCORBIC ACID) 500 MG tablet Take 500 mg by mouth daily.   Yes [provider]  BIOTIN PO Take by mouth daily. Pt taking 500 mcg daily    [provider]  Diapers & Supplies (HUGGIES PULL-UPS) MISC 1 each  by Does not apply route 2 (two) times daily. Dx urinary incontinence, MS; LON 99 months 02/03/17   Arnetha Courser, MD  fluticasone furoate-vilanterol (BREO ELLIPTA) 100-25 MCG/INH AEPB Inhale 1 puff into the lungs daily. Patient not taking: Reported on 02/06/2017 01/05/16   Arnetha Courser, MD  lidocaine-prilocaine (EMLA) cream Apply 1 application topically as needed. apply cream over the port site and then place small piece of saran wrap over the cream to protect your clothing 11/07/16   Sindy Guadeloupe, MD  polyethylene glycol powder (GLYCOLAX/MIRALAX) powder Take 17 g by mouth daily as needed for mild constipation. Mixed in water 01/06/17   Sudini, Srikar, MD  senna-docusate (SENOKOT-S) 8.6-50 MG tablet Take 1 tablet by mouth daily  as needed for mild constipation.     [provider]  tolnaftate (TINACTIN) 1 % cream Apply 1 application topically daily. Patient not taking: Reported on 02/04/2017 02/03/17   Arnetha Courser, MD      PHYSICAL EXAMINATION:   VITAL SIGNS: Blood pressure (!) 90/56, pulse 66, temperature 98.1 F (36.7 C), temperature source Oral, resp. rate 13, height 5\' 4"  (1.626 m), weight 280 lb (127 kg), SpO2 97 %.  GENERAL:  67 y.o.-year-old patient lying in the bed morbidly obese female  EYES: Pupils equal, round, reactive to light and accommodation. No scleral icterus. Extraocular muscles intact.  HEENT: Head atraumatic, normocephalic. Oropharynx and nasopharynx clear.  NECK:  Supple, no jugular venous distention. No thyroid enlargement, no tenderness.  LUNGS: Normal breath sounds bilaterally, no wheezing, rales,rhonchi or crepitation. No use of accessory muscles of respiration.  CARDIOVASCULAR: S1, S2 normal. No murmurs, rubs, or gallops.  ABDOMEN: Soft, nontender, nondistended. Bowel sounds present. No organomegaly or mass.  EXTREMITIES: 2+ edema, cyanosis, or clubbing.  NEUROLOGIC: Cranial nerves II through XII are intact. Muscle strength 5/5 in all extremities.  Sensation intact. Gait not checked.  PSYCHIATRIC: The patient is alert and oriented x 3.  SKIN: No obvious rash, lesion, or ulcer.   LABORATORY PANEL:   CBC  Recent Labs Lab 02/06/17 1710  WBC 7.7  HGB 10.5*  HCT 30.8*  PLT 259  MCV 85.6  MCH 29.2  MCHC 34.1  RDW 17.3*   ------------------------------------------------------------------------------------------------------------------  Chemistries   Recent Labs Lab 02/06/17 1710  NA 129*  K 3.6  CL 97*  CO2 24  GLUCOSE 106*  BUN 18  CREATININE 1.52*  CALCIUM 8.9  AST 19  ALT 17  ALKPHOS 64  BILITOT 0.5   ------------------------------------------------------------------------------------------------------------------ estimated creatinine clearance is 48 mL/min (A) (by C-G formula based on SCr of 1.52 mg/dL (H)). ------------------------------------------------------------------------------------------------------------------ No results for input(s): TSH, T4TOTAL, T3FREE, THYROIDAB in the last 72 hours.  Invalid input(s): FREET3   Coagulation profile No results for input(s): INR, PROTIME in the last 168 hours. ------------------------------------------------------------------------------------------------------------------- No results for input(s): DDIMER in the last 72 hours. -------------------------------------------------------------------------------------------------------------------  Cardiac Enzymes  Recent Labs Lab 02/06/17 1710  TROPONINI <0.03   ------------------------------------------------------------------------------------------------------------------ Invalid input(s): POCBNP  ---------------------------------------------------------------------------------------------------------------  Urinalysis    Component Value Date/Time   COLORURINE AMBER (A) 02/06/2017 1711   APPEARANCEUR HAZY (A) 02/06/2017 1711   APPEARANCEUR Hazy 08/02/2013 0020   LABSPEC 1.018 02/06/2017 1711    LABSPEC 1.012 08/02/2013 0020   PHURINE 5.0 02/06/2017 1711   GLUCOSEU NEGATIVE 02/06/2017 1711   GLUCOSEU Negative 08/02/2013 0020   HGBUR NEGATIVE 02/06/2017 1711   BILIRUBINUR NEGATIVE 02/06/2017 1711   BILIRUBINUR Negative 08/02/2013 0020   KETONESUR 5 (A) 02/06/2017 1711   PROTEINUR 100 (A) 02/06/2017 1711   NITRITE NEGATIVE 02/06/2017 1711   LEUKOCYTESUR LARGE (A) 02/06/2017 1711   LEUKOCYTESUR 2+ 08/02/2013 0020     RADIOLOGY: Dg Chest Port 1 View  Result Date: 02/06/2017 CLINICAL DATA:  Weakness and multiple falls x3 days. Hx of COPD, HTN. Former smoker. EXAM: PORTABLE CHEST - 1 VIEW COMPARISON:  07/22/2016 FINDINGS: Lungs are clear.  Low lung volumes. Heart size and mediastinal contours are within normal limits. Left IJ port catheter tip has retracted into the left innominate vein. No pneumothorax. No effusion. Visualized bones unremarkable. IMPRESSION: 1. No acute cardiopulmonary disease. 2. Left IJ port catheter tip has retracted into the left innominate vein. Electronically Signed   By: Lucrezia Europe M.D.   On: 02/06/2017  17:57    EKG: Orders placed or performed during the hospital encounter of 02/06/17  . ED EKG  . ED EKG  . ED EKG 12-Lead  . ED EKG 12-Lead  . EKG 12-Lead  . EKG 12-Lead    IMPRESSION AND PLAN: Patient is a 66 year old morbidly obese female with multiple sclerosis presenting with multiple falls  1. UTI present on admission Recent culture showed Pseudomonas sensitive to ceftriaxone will treat with ceftriaxone and await urine culture  2. Recurrent falls PT evaluation may need rehabilitation again  3. Hypotension suspect related to #1 Stop all antihypertensives Her blood pressure continues to be low consider stopping Zanaflex  4. Depression and anxiety continue Wellbutrin and Klonopin and Cymbalta  5. Neuropathy continue Neurontin  6. Multiple sclerosis patient gets interferon every Monday she needs to continue that at home  7. Miscellaneous  Lovenox for DVT prophylaxis    All the records are reviewed and case discussed with ED provider. Management plans discussed with the patient, family and they are in agreement.  CODE STATUS: Code Status History    Date Active Date Inactive Code Status Order ID Comments User Context   01/02/2017  7:03 PM 01/06/2017  6:48 PM Full Code [id number]  [name], MD ED   12/29/2016 12:52 PM 12/30/2016  5:22 PM Full Code [id number]  Nicholes Mango, MD Inpatient   10/11/2016 11:20 PM 10/14/2016  5:26 PM Full Code [id number]  Theodoro Grist, MD Inpatient   08/28/2016 11:59 AM 08/30/2016  7:32 PM Full Code [id number]  Loletha Grayer, MD ED   12/18/2015  7:56 PM 12/19/2015  7:39 PM Full Code [id number]  Hower, Aaron Mose, MD ED       TOTAL TIME TAKING CARE OF THIS PATIENT: 75minutes.    Dustin Flock M.D on 02/06/2017 at 9:13 PM  Between 7am to 6pm - Pager - [phone]  After 6pm go to [email or web address] - password [email or web address] [facility]  Office  [phone]  CC: Primary care physician; Arnetha Courser, MD

## 2017-02-06 NOTE — ED Notes (Signed)
EDP requested old foley removed and new foley placed.

## 2017-02-07 ENCOUNTER — Inpatient Hospital Stay: Payer: Medicare Other

## 2017-02-07 LAB — BASIC METABOLIC PANEL
Anion gap: 8 (ref 5–15)
BUN: 18 mg/dL (ref 6–20)
CO2: 24 mmol/L (ref 22–32)
Calcium: 8.6 mg/dL — ABNORMAL LOW (ref 8.9–10.3)
Chloride: 104 mmol/L (ref 101–111)
Creatinine, Ser: 1.55 mg/dL — ABNORMAL HIGH (ref 0.44–1.00)
GFR calc Af Amer: 39 mL/min — ABNORMAL LOW (ref >60)
GFR calc non Af Amer: 34 mL/min — ABNORMAL LOW (ref >60)
Glucose, Bld: 119 mg/dL — ABNORMAL HIGH (ref 65–99)
Potassium: 4.1 mmol/L (ref 3.5–5.1)
Sodium: 136 mmol/L (ref 135–145)

## 2017-02-07 LAB — CBC
HCT: 28.8 % — ABNORMAL LOW (ref 35.0–47.0)
Hemoglobin: 9.7 g/dL — ABNORMAL LOW (ref 12.0–16.0)
MCH: 29.3 pg (ref 26.0–34.0)
MCHC: 33.6 g/dL (ref 32.0–36.0)
MCV: 87 fL (ref 80.0–100.0)
Platelets: 222 10*3/uL (ref 150–440)
RBC: 3.31 MIL/uL — ABNORMAL LOW (ref 3.80–5.20)
RDW: 17.6 % — ABNORMAL HIGH (ref 11.5–14.5)
WBC: 5.1 10*3/uL (ref 3.6–11.0)

## 2017-02-07 MED ORDER — POLYETHYLENE GLYCOL 3350 17 G PO PACK: 17.0000 g | PACK | Freq: Every day | ORAL | Status: DC | PRN

## 2017-02-07 MED ORDER — NYSTATIN 100000 UNIT/GM EX CREA
TOPICAL_CREAM | Freq: Every day | CUTANEOUS | Status: DC
  Administered 2017-02-07 – 2017-02-09 (×3): via TOPICAL
  Filled 2017-02-07: qty 15

## 2017-02-07 MED ORDER — DEXTROSE 5 % IV SOLN
1.0000 g | INTRAVENOUS | Status: DC
  Administered 2017-02-07 – 2017-02-08 (×2): 1 g via INTRAVENOUS
  Filled 2017-02-07 (×2): qty 10

## 2017-02-07 NOTE — Evaluation (Signed)
Physical Therapy Evaluation Patient Details Name: [name] MRN: [id number] DOB: July 25, 1950 Today's Date: 02/07/2017   History of Present Illness  [name] is a 66 y.o. female with a known history of  Multiple sclerosis, COPD, depression, chronic lower extremity edema , essential hypertension, morbid obesity and peripheral vascular disease who was recently in rehabilitation and was discharged to 3 weeks ago who has been having falls for the past 3 days. She states that she is using a walker to walk. However whenever she walks she loses balance and falls. Patient presented to the ER with these symptoms and was noted to have a urinary tract infection. She does have a chronic indwelling Foley. She has urine cultures done from September which showed Pseudomonas in her urine. Patient has not had any fevers chills no chest pains palpitations. To note patient's blood pressure was low but is stable at time of PT evaluation as check in supine  Clinical Impression  Pt admitted with above diagnosis. Pt currently with functional limitations due to the deficits listed below (see PT Problem List). Pt is very weak during evaluation. She requires modA for bed mobility and modA+1 for sit to stand transfers. Once upright pt is unsteady/unstable in standing. Heavy UE support on rolling walker. Attempted to get orthostatic BP readings in standing but pt unable to remain standing long enough to perform. Unsafe/unable to attempt ambulation at this time. She has had multiple SNF placements but tends to go home and become deconditioned again. She is currently unsafe to return home and will again require SNF placement. Family/caregiver report that pt did well initially after discharge from SNF last time. Recommend SNF placement in order to improve strength and mobility. It is likely that ultimately pt will need some sort of LTC placement as she already requires 24/7 care from family/friends and is still suffering from  repeated infections and falls. Pt will benefit from PT services to address deficits in strength, balance, and mobility in order to return to full function at home.     Follow Up Recommendations SNF    Equipment Recommendations  None recommended by PT    Recommendations for Other Services Rehab consult     Precautions / Restrictions Precautions Precautions: Fall Restrictions Weight Bearing Restrictions: No      Mobility  Bed Mobility Overal bed mobility: Needs Assistance Bed Mobility: Supine to Sit;Sit to Supine     Supine to sit: Mod assist Sit to supine: Mod assist   General bed mobility comments: Pt requires modA+1 to roll onto right side and to push up to sitting. Once upright she does not require assist to balance. HOB elevated, increased time, and heavy UE support on bed rails  Transfers Overall transfer level: Needs assistance Equipment used: Rolling walker (2 wheeled) Transfers: Sit to/from Stand Sit to Stand: Mod assist         General transfer comment: Pt requires modA+1 for sit to stand transfer. Once upright pt is unsteady/unstable in standing. Heavy UE support on rolling walker. Attempted to get orthostatic BP readings in standing but pt unable to remain standing long enough to perform. Unsafe/unable to attempt ambulation at this time.  Ambulation/Gait             General Gait Details: Unable/unsafe to attempt  Stairs            Wheelchair Mobility    Modified Rankin (Stroke Patients Only)       Balance Overall balance assessment: Needs assistance Sitting-balance support:  No upper extremity supported Sitting balance-Leahy Scale: Good     Standing balance support: No upper extremity supported Standing balance-Leahy Scale: Poor Standing balance comment: Requires minA+1 and heavy UE support on rolling walker to remain in standing                             Pertinent Vitals/Pain Pain Assessment: No/denies pain    Home  Living Family/patient expects to be discharged to:: Private residence Living Arrangements: Spouse/significant other Available Help at Discharge: Family Type of Home: House Home Access: Ramped entrance     Home Layout: One level Home Equipment: Environmental consultant - 4 wheels;Other (comment);Shower seat;Walker - 2 wheels Technical brewer) Additional Comments: pt reluctant to use RW but is not able to steady on Rollator    Prior Function Level of Independence: Needs assistance   Gait / Transfers Assistance Needed: Rollator for ambulation for very limited household ambulation. Very sedentary at baseline. Family reports at least 50 times that EMS has had to come get pt off floor due to falls and inability to stand. 3 falls over the last couple days prior to admission  ADL's / Homemaking Assistance Needed: Requires assist for all ADLs/IADLs  Comments: relies on husband for assist in/out of bed. Per pt, she is able to walk household distances, however needs to sit on rollater when she fatigues. Husband performs all ADLs.     Hand Dominance   Dominant Hand: Right    Extremity/Trunk Assessment   Upper Extremity Assessment Upper Extremity Assessment: Generalized weakness    Lower Extremity Assessment Lower Extremity Assessment: Generalized weakness       Communication   Communication: No difficulties  Cognition Arousal/Alertness: Awake/alert Behavior During Therapy: WFL for tasks assessed/performed Overall Cognitive Status: Within Functional Limits for tasks assessed                                        General Comments      Exercises     Assessment/Plan    PT Assessment Patient needs continued PT services  PT Problem List Decreased strength;Decreased activity tolerance;Decreased balance;Decreased mobility;Decreased safety awareness;Obesity       PT Treatment Interventions DME instruction;Gait training;Stair training;Functional mobility training;Therapeutic  activities;Therapeutic exercise;Balance training;Neuromuscular re-education;Patient/family education    PT Goals (Current goals can be found in the Care Plan section)  Acute Rehab PT Goals Patient Stated Goal: Return to [facility] Goal Formulation: With patient/family Time For Goal Achievement: 02/21/17 Potential to Achieve Goals: Fair    Frequency Min 2X/week   Barriers to discharge        Co-evaluation               AM-PAC PT "6 Clicks" Daily Activity  Outcome Measure Difficulty turning over in bed (including adjusting bedclothes, sheets and blankets)?: Unable Difficulty moving from lying on back to sitting on the side of the bed? : Unable Difficulty sitting down on and standing up from a chair with arms (e.g., wheelchair, bedside commode, etc,.)?: Unable Help needed moving to and from a bed to chair (including a wheelchair)?: Total Help needed walking in hospital room?: Total Help needed climbing 3-5 steps with a railing? : Total 6 Click Score: 6    End of Session Equipment Utilized During Treatment: Gait belt Activity Tolerance: Patient tolerated treatment well Patient left: in bed;with call bell/phone within reach;with bed  alarm set Nurse Communication: Mobility status PT Visit Diagnosis: Unsteadiness on feet (R26.81);Repeated falls (R29.6);Muscle weakness (generalized) (M62.81);Difficulty in walking, not elsewhere classified (R26.2)    Time: 0940-1006 PT Time Calculation (min) (ACUTE ONLY): 26 min   Charges:   PT Evaluation $PT Eval Low Complexity: 1 Low PT Treatments $Therapeutic Activity: 8-22 mins   PT G Codes:   PT G-Codes **NOT FOR INPATIENT CLASS** Functional Assessment Tool Used: AM-PAC 6 Clicks Basic Mobility;Clinical judgement Functional Limitation: Mobility: Walking and moving around Mobility: Walking and Moving Around Current Status ([id number]): At least 80 percent but less than 100 percent impaired, limited or restricted Mobility: Walking and Moving  Around Goal Status [phone]): At least 40 percent but less than 60 percent impaired, limited or restricted    Phillips Grout PT, DPT    Huprich,Jason 02/07/2017, 11:19 AM

## 2017-02-07 NOTE — Progress Notes (Signed)
[name] at [place] NAME: [name]    MR#:  [id number]  DATE OF BIRTH:  08/01/1950  SUBJECTIVE:  CHIEF COMPLAINT:  Patient is feeling better today. Her friend  is at bedside  REVIEW OF SYSTEMS:  CONSTITUTIONAL: No fever, fatigue or weakness.  EYES: No blurred or double vision.  EARS, NOSE, AND THROAT: No tinnitus or ear pain.  RESPIRATORY: No cough, shortness of breath, wheezing or hemoptysis.  CARDIOVASCULAR: No chest pain, orthopnea, edema.  GASTROINTESTINAL: No nausea, vomiting, diarrhea or abdominal pain.  GENITOURINARY: No dysuria, hematuria.  ENDOCRINE: No polyuria, nocturia,  HEMATOLOGY: No anemia, easy bruising or bleeding SKIN: No rash or lesion. MUSCULOSKELETAL: No joint pain or arthritis.   NEUROLOGIC: No tingling, numbness, weakness.  PSYCHIATRY: No anxiety or depression.   DRUG ALLERGIES:  No Known Allergies  VITALS:  Blood pressure (!) 106/49, pulse (!) 58, temperature 98.2 F (36.8 C), temperature source Oral, resp. rate 18, height 5\' 4"  (1.626 m), weight 130.6 kg (287 lb 14.4 oz), SpO2 100 %.  PHYSICAL EXAMINATION:  GENERAL:  66 y.o.-year-old patient lying in the bed with no acute distress.  EYES: Pupils equal, round, reactive to light and accommodation. No scleral icterus. Extraocular muscles intact.  HEENT: Head atraumatic, normocephalic. Oropharynx and nasopharynx clear.  NECK:  Supple, no jugular venous distention. No thyroid enlargement, no tenderness.  LUNGS: Normal breath sounds bilaterally, no wheezing, rales,rhonchi or crepitation. No use of accessory muscles of respiration.  CARDIOVASCULAR: S1, S2 normal. No murmurs, rubs, or gallops.  ABDOMEN: Soft, nontender, nondistended. Bowel sounds present. No organomegaly or mass.  EXTREMITIES: Left lower extremity is edematous, erythematous and tender No pedal edema, cyanosis, or clubbing.  NEUROLOGIC: Cranial nerves II through XII are intact.  Sensation  intact. Gait not checked.  PSYCHIATRIC: The patient is alert and oriented x 3.  SKIN: No obvious rash, lesion, or ulcer.    LABORATORY PANEL:   CBC  Recent Labs Lab 02/07/17 0538  WBC 5.1  HGB 9.7*  HCT 28.8*  PLT 222   ------------------------------------------------------------------------------------------------------------------  Chemistries   Recent Labs Lab 02/06/17 1710 02/07/17 0538  NA 129* 136  K 3.6 4.1  CL 97* 104  CO2 24 24  GLUCOSE 106* 119*  BUN 18 18  CREATININE 1.52* 1.55*  CALCIUM 8.9 8.6*  AST 19  --   ALT 17  --   ALKPHOS 64  --   BILITOT 0.5  --    ------------------------------------------------------------------------------------------------------------------  Cardiac Enzymes  Recent Labs Lab 02/06/17 1710  TROPONINI <0.03   ------------------------------------------------------------------------------------------------------------------  RADIOLOGY:  Ct Head Wo Contrast  Result Date: 02/06/2017 CLINICAL DATA:  Frequent falls recently. EXAM: CT HEAD WITHOUT CONTRAST TECHNIQUE: Contiguous axial images were obtained from the base of the skull through the vertex without intravenous contrast. COMPARISON:  02/25/2013, 02/26/2013. FINDINGS: Brain: There is no intracranial hemorrhage, mass or evidence of acute infarction. There is moderate generalized atrophy. There is extensive white matter hypodensity, unchanged or slightly progressed from the prior studies. No extra-axial collections. There is no significant extra-axial fluid collection. No acute intracranial findings are evident. Vascular: No hyperdense vessel or unexpected calcification. Skull: Normal. Negative for fracture or focal lesion. Sinuses/Orbits: No acute finding. Other: None. IMPRESSION: No acute intracranial findings. There is moderate generalized atrophy and chronic white matter hypodensities which may be mildly worsened from 2014. Electronically Signed   By: [name] M.D.    On: 02/06/2017 22:00   Dg Chest [facility]  Result Date: 02/06/2017 CLINICAL DATA:  Weakness and multiple falls x3 days. Hx of COPD, HTN. Former smoker. EXAM: PORTABLE CHEST - 1 VIEW COMPARISON:  07/22/2016 FINDINGS: Lungs are clear.  Low lung volumes. Heart size and mediastinal contours are within normal limits. Left IJ port catheter tip has retracted into the left innominate vein. No pneumothorax. No effusion. Visualized bones unremarkable. IMPRESSION: 1. No acute cardiopulmonary disease. 2. Left IJ port catheter tip has retracted into the left innominate vein. Electronically Signed   By: [name] M.D.   On: 02/06/2017 17:57    EKG:   Orders placed or performed during the hospital encounter of 02/06/17  . ED EKG  . ED EKG  . ED EKG 12-Lead  . ED EKG 12-Lead  . EKG 12-Lead  . EKG 12-Lead    ASSESSMENT AND PLAN:    Patient is a 66 year old morbidly obese female with multiple sclerosis presenting with multiple falls  1. UTI present on admission and left lower extending to cellulitis Recent urine culture showed Pseudomonas sensitive to ceftriaxone will treat with ceftriaxone and await urine culture IV Rocephin for cellulitis of left lower extremity and will get venous Dopplers to rule out DVT  2. Recurrent falls PT evaluation - recommeds SNF  3. Hypotension suspect related to #1 Hold all antihypertensives Her blood pressure continues to be low consider stopping Zanaflex  4. Depression and anxiety continue Wellbutrin and Klonopin and Cymbalta  5. Neuropathy continue Neurontin  6. Multiple sclerosis patient gets interferon every Monday she needs to continue that at home  7. History of lymphoma outpatient follow-up with Dr. Janese [name]  7. Miscellaneous Lovenox for DVT prophylaxis    All the records are reviewed and case discussed with Care Management/Social Workerr. Management plans discussed with the patient, family and they are in agreement.  CODE STATUS: fc ,  healthcare power of attorney kELLYpatient's friend-discussed with her at bedside her number is [phone]  TOTAL TIME TAKING CARE OF THIS PATIENT: 45 minutes.   POSSIBLE D/C IN 2  DAYS, DEPENDING ON CLINICAL CONDITION.  Note: This dictation was prepared with Dragon dictation along with smaller phrase technology. Any transcriptional errors that result from this process are unintentional.   Nicholes Mango M.D on 02/07/2017 at 2:28 PM  Between 7am to 6pm - Pager - [phone] After 6pm go to [email or web address] - password [email or web address] [facility]  Office  [phone]  CC: Primary care physician; Arnetha Courser, MD

## 2017-02-08 LAB — BASIC METABOLIC PANEL
Anion gap: 7 (ref 5–15)
BUN: 15 mg/dL (ref 6–20)
CO2: 24 mmol/L (ref 22–32)
Calcium: 8.5 mg/dL — ABNORMAL LOW (ref 8.9–10.3)
Chloride: 104 mmol/L (ref 101–111)
Creatinine, Ser: 1.28 mg/dL — ABNORMAL HIGH (ref 0.44–1.00)
GFR calc Af Amer: 49 mL/min — ABNORMAL LOW (ref >60)
GFR calc non Af Amer: 43 mL/min — ABNORMAL LOW (ref >60)
Glucose, Bld: 95 mg/dL (ref 65–99)
Potassium: 3.7 mmol/L (ref 3.5–5.1)
Sodium: 135 mmol/L (ref 135–145)

## 2017-02-08 LAB — CBC
HCT: 27.9 % — ABNORMAL LOW (ref 35.0–47.0)
Hemoglobin: 9.6 g/dL — ABNORMAL LOW (ref 12.0–16.0)
MCH: 29.7 pg (ref 26.0–34.0)
MCHC: 34.4 g/dL (ref 32.0–36.0)
MCV: 86.5 fL (ref 80.0–100.0)
Platelets: 196 10*3/uL (ref 150–440)
RBC: 3.22 MIL/uL — ABNORMAL LOW (ref 3.80–5.20)
RDW: 17.2 % — ABNORMAL HIGH (ref 11.5–14.5)
WBC: 4.2 10*3/uL (ref 3.6–11.0)

## 2017-02-08 NOTE — Progress Notes (Signed)
[place] at [place] NAME: [name]    MR#:  [id number]  DATE OF BIRTH:  06/01/1950  SUBJECTIVE:   Doing well this am  REVIEW OF SYSTEMS:    Review of Systems  Constitutional: Negative for fever, chills weight loss HENT: Negative for ear pain, nosebleeds, congestion, facial swelling, rhinorrhea, neck pain, neck stiffness and ear discharge.   Respiratory: Negative for cough, shortness of breath, wheezing  Cardiovascular: Negative for chest pain, palpitations and leg swelling.  Gastrointestinal: Negative for heartburn, abdominal pain, vomiting, diarrhea or consitpation Genitourinary: Negative for dysuria, urgency, frequency, hematuria Musculoskeletal: Negative for back pain or joint pain Neurological: Negative for dizziness, seizures, syncope, focal weakness,  numbness and headaches.  Hematological: Does not bruise/bleed easily.  Psychiatric/Behavioral: Negative for hallucinations, confusion, dysphoric mood    Tolerating Diet: yes      DRUG ALLERGIES:  No Known Allergies  VITALS:  Blood pressure (!) 112/48, pulse (!) 57, temperature 98.8 F (37.1 C), temperature source Oral, resp. rate 18, height 5\' 4"  (1.626 m), weight 130.6 kg (287 lb 14.4 oz), SpO2 96 %.  PHYSICAL EXAMINATION:  Constitutional: Appears morbidly obese No distress. HENT: Normocephalic. Marland Kitchen Oropharynx is clear and moist.  Eyes: Conjunctivae and EOM are normal. PERRLA, no scleral icterus.  Neck: Normal ROM. Neck supple. No JVD. No tracheal deviation. CVS: RRR, S1/S2 +, no murmurs, no gallops, no carotid bruit.  Pulmonary: Effort and breath sounds normal, no stridor, rhonchi, wheezes, rales.  Abdominal: Soft. BS +,  no distension, tenderness, rebound or guarding.  Musculoskeletal: Normal range of motion. No edema and no tenderness.  Neuro: Alert. CN 2-12 grossly intact. No focal deficits. Skin: left LE red some edema.non tender Psychiatric: Normal mood and affect.       LABORATORY PANEL:   CBC  Recent Labs Lab 02/08/17 0446  WBC 4.2  HGB 9.6*  HCT 27.9*  PLT 196   ------------------------------------------------------------------------------------------------------------------  Chemistries   Recent Labs Lab 02/06/17 1710  02/08/17 0446  NA 129*  < > 135  K 3.6  < > 3.7  CL 97*  < > 104  CO2 24  < > 24  GLUCOSE 106*  < > 95  BUN 18  < > 15  CREATININE 1.52*  < > 1.28*  CALCIUM 8.9  < > 8.5*  AST 19  --   --   ALT 17  --   --   ALKPHOS 64  --   --   BILITOT 0.5  --   --   < > = values in this interval not displayed. ------------------------------------------------------------------------------------------------------------------  Cardiac Enzymes  Recent Labs Lab 02/06/17 1710  TROPONINI <0.03   ------------------------------------------------------------------------------------------------------------------  RADIOLOGY:  Ct Head Wo Contrast  Result Date: 02/06/2017 CLINICAL DATA:  Frequent falls recently. EXAM: CT HEAD WITHOUT CONTRAST TECHNIQUE: Contiguous axial images were obtained from the base of the skull through the vertex without intravenous contrast. COMPARISON:  02/25/2013, 02/26/2013. FINDINGS: Brain: There is no intracranial hemorrhage, mass or evidence of acute infarction. There is moderate generalized atrophy. There is extensive white matter hypodensity, unchanged or slightly progressed from the prior studies. No extra-axial collections. There is no significant extra-axial fluid collection. No acute intracranial findings are evident. Vascular: No hyperdense vessel or unexpected calcification. Skull: Normal. Negative for fracture or focal lesion. Sinuses/Orbits: No acute finding. Other: None. IMPRESSION: No acute intracranial findings. There is moderate generalized atrophy and chronic white matter hypodensities which may be mildly worsened from 2014. Electronically  Signed   By: Andreas Newport M.D.   On:  02/06/2017 22:00   US Venous Img Lower Unilateral Left  Result Date: 02/07/2017 CLINICAL DATA:  Cellulitis and abscess of left lower extremity. History of lymphoma. EXAM: LEFT LOWER EXTREMITY VENOUS DOPPLER ULTRASOUND TECHNIQUE: Gray-scale sonography with graded compression, as well as color Doppler and duplex ultrasound were performed to evaluate the lower extremity deep venous systems from the level of the common femoral vein and including the common femoral, femoral, profunda femoral, popliteal and calf veins including the posterior tibial, peroneal and gastrocnemius veins when visible. The superficial great saphenous vein was also interrogated. Spectral Doppler was utilized to evaluate flow at rest and with distal augmentation maneuvers in the common femoral, femoral and popliteal veins. COMPARISON:  12/29/2016 FINDINGS: Contralateral Common Femoral Vein: Respiratory phasicity is normal and symmetric with the symptomatic side. No evidence of thrombus. Normal compressibility. Common Femoral Vein: No evidence of thrombus. Normal compressibility, respiratory phasicity and response to augmentation. Saphenofemoral Junction: No evidence of thrombus. Normal compressibility and flow on color Doppler imaging. Profunda Femoral Vein: No evidence of thrombus. Normal compressibility and flow on color Doppler imaging. Femoral Vein: No evidence of thrombus. Normal compressibility, respiratory phasicity and response to augmentation. Popliteal Vein: No evidence of thrombus. Normal compressibility, respiratory phasicity and response to augmentation. Calf Veins: No evidence of thrombus. Normal compressibility and flow on color Doppler imaging. Superficial Great Saphenous Vein: No evidence of thrombus. Normal compressibility. Venous Reflux:  None. Other Findings: No evidence of superficial thrombophlebitis. Cystic/necrotic portion of a left inguinal lymph node appears smaller compared to the prior study and now measures  approximately 3.5 cm in greatest transverse diameter compared to 4.6 cm previously. IMPRESSION: No evidence of left lower extremity deep venous thrombosis. Cystic/necrotic portion of a left inguinal lymph node is smaller compared to a prior study in September. Electronically Signed   By: Aletta Edouard M.D.   On: 02/07/2017 18:17   Dg Chest Port 1 View  Result Date: 02/06/2017 CLINICAL DATA:  Weakness and multiple falls x3 days. Hx of COPD, HTN. Former smoker. EXAM: PORTABLE CHEST - 1 VIEW COMPARISON:  07/22/2016 FINDINGS: Lungs are clear.  Low lung volumes. Heart size and mediastinal contours are within normal limits. Left IJ port catheter tip has retracted into the left innominate vein. No pneumothorax. No effusion. Visualized bones unremarkable. IMPRESSION: 1. No acute cardiopulmonary disease. 2. Left IJ port catheter tip has retracted into the left innominate vein. Electronically Signed   By: Lucrezia Europe M.D.   On: 02/06/2017 17:57     ASSESSMENT AND PLAN:   66 year old female with history of MS, COPD and chronic lower extremity edema who presents after a mechanical fall and found to have cellulitis and urinary tract infection.  1. Left lower extremity cellulitis with chronic lower extremity edema Continue Rocephin Elevate leg Dopplers were negative for DVT  2. Urinary tract infection: Continue Rocephin Urine culture showing 50,000 gram-negative rods Follow-up on final culture for tomorrow  3 Hyponatremia: This is improved 4 Hypotension: Blood pressure has improved 5. COPD without signs of exacerbation  6. Depression: Continue Wellbutrin, Cymbalta  7. Recurrent falls: Physical therapy is recommending skilled nursing facility upon discharge however patient would like to go home with home health  8. MS: Patient receives interferon on Mondays Management plans discussed with the patient and she is in agreement.  CODE STATUS: full  TOTAL TIME TAKING CARE OF THIS PATIENT: 30 minutes.      POSSIBLE D/C tomorrow,  DEPENDING ON CLINICAL CONDITION.   Townsend Cudworth M.D on 02/08/2017 at 9:21 AM  Between 7am to 6pm - Pager - [phone] After 6pm go to [email or web address] - password [email or web address] [facility]  Office  [phone]  CC: Primary care physician; Arnetha Courser, MD  Note: This dictation was prepared with Dragon dictation along with smaller phrase technology. Any transcriptional errors that result from this process are unintentional.

## 2017-02-08 NOTE — Clinical Social Work Note (Addendum)
CSW received consult for STR. CSW will assess when able. The patient will require an update to [facility] and a signed 30 day note. Change to PASSAR has already been initiated in [place] [facility]. CSW is following.  Addendum: According to the MD note, the patient is declining STR. CSW is signing off. Please consult should the patient change her mind.  Santiago Bumpers, MSW, Latanya Presser [phone]

## 2017-02-09 LAB — URINE CULTURE: Culture: 50000 — AB

## 2017-02-09 MED ORDER — NYSTATIN 100000 UNIT/GM EX CREA: TOPICAL_CREAM | Freq: Every day | CUTANEOUS | 0 refills | Status: DC

## 2017-02-09 MED ORDER — GABAPENTIN 600 MG PO TABS: 600.0000 mg | ORAL_TABLET | Freq: Three times a day (TID) | ORAL | Status: DC

## 2017-02-09 MED ORDER — CEPHALEXIN 500 MG PO CAPS
500.0000 mg | ORAL_CAPSULE | Freq: Two times a day (BID) | ORAL | Status: DC
  Administered 2017-02-09: 500 mg via ORAL
  Filled 2017-02-09: qty 1

## 2017-02-09 MED ORDER — CEPHALEXIN 500 MG PO CAPS: 500.0000 mg | ORAL_CAPSULE | Freq: Two times a day (BID) | ORAL | 0 refills | Status: DC

## 2017-02-09 NOTE — Care Management Note (Signed)
Case Management Note  Patient Details  Name: [name] MRN: [id number] Date of Birth: [birth year]  Subjective/Objective:     A referral for home health PT, RN, Aide, SW was called to [place] [facility] with request to resume PT, RN, Aide and add SW.                Action/Plan:   Expected Discharge Date:  02/09/17               Expected Discharge Plan:  [place]  In-House Referral:  NA  Discharge planning Services  CM Consult  Post Acute Care Choice:  NA Choice offered to:  Patient  DME Arranged:    DME Agency:     HH Arranged:  RN, PT, Nurse's Aide, Social Work (resume [place] with [employer]) [place]:  [facility]  Status of Service:  Completed, signed off  If discussed at [place] of Stay Meetings, dates discussed:    Additional Comments:  Samah Lapiana A, RN 02/09/2017, 9:49 AM

## 2017-02-09 NOTE — Discharge Summary (Signed)
[name] NAME: [name]    MR#:  [id number]  DATE OF BIRTH:  [birth year]  DATE OF ADMISSION:  02/06/2017 ADMITTING PHYSICIAN: Dustin Flock, MD  DATE OF DISCHARGE: 02/09/2017  PRIMARY CARE PHYSICIAN: Arnetha Courser, MD    ADMISSION DIAGNOSIS:  Hyponatremia [E87.1] Renal insufficiency [N28.9] Sepsis, due to unspecified organism ([place]) [A41.9] Urinary tract infection without hematuria, site unspecified [N39.0]  DISCHARGE DIAGNOSIS:  Active Problems:   Sepsis ([place])   SECONDARY DIAGNOSIS:   Past Medical History:  Diagnosis Date  . Abdominal aortic atherosclerosis ([place]) 11/11/2016  . ADHD   . Anxiety   . COPD (chronic obstructive pulmonary disease) ([place])   . Depression    major depressive  . Dyspnea    doe  . Edema    left leg  . Follicular lymphoma ([place])    B Cell  . Follicular lymphoma grade II ([place])   . Hypertension   . Hypotension    idiopathic  . Kyphoscoliosis and scoliosis 11/26/2011  . Morbid obesity ([place]) 01/05/2016  . Multiple sclerosis ([place])   . Multiple sclerosis ([place])    1980's  . Neuromuscular disorder ([place])   . Obstructive and reflux uropathy    foley  . Pain    atypical facial  . Peripheral vascular disease of lower extremity with ulceration ([place]) 11/08/2015  . Skin ulcer ([place]) 11/08/2015  . Weakness    generalized. has MS    HOSPITAL COURSE:   66 year old female with history of MS, COPD and chronic lower extremity edema who presents after a mechanical fall and found to have cellulitis and urinary tract infection.  1. Left lower extremity cellulitis with chronic lower extremity edema  She was placed on IV Rocephin.  She will be discharged on oral Keflex. She will elevate her leg. She will follow up with PCP in 3 days. Dopplers were negative for DVT.  2. Klebsiella Urinary tract infection: S he will be discharged on oral Keflex. Klebsiella was pansensitive.  3 Hyponatremia: This has  improved 4 Hypotension: Blood pressure has improved 5. COPD without signs of exacerbation  6. Depression: Continue Wellbutrin, Cymbalta  7. Recurrent falls: Physical therapy is recommending skilled nursing facility upon discharge however patient would like to go home with home health  8. MS: Patient receives interferon on Mondays  DISCHARGE CONDITIONS AND DIET:   Stable for discharge on heart healty diet  CONSULTS OBTAINED:    DRUG ALLERGIES:  No Known Allergies  DISCHARGE MEDICATIONS:   Current Discharge Medication List    START taking these medications   Details  cephALEXin (KEFLEX) 500 MG capsule Take 1 capsule (500 mg total) by mouth every 12 (twelve) hours. Qty: 12 capsule, Refills: 0    nystatin cream (MYCOSTATIN) Apply topically daily. Qty: 30 g, Refills: 0      CONTINUE these medications which have CHANGED   Details  gabapentin (NEURONTIN) 600 MG tablet Take 1 tablet (600 mg total) by mouth 3 (three) times daily.      CONTINUE these medications which have NOT CHANGED   Details  buPROPion (WELLBUTRIN XL) 300 MG 24 hr tablet Take 300 mg by mouth every morning.    Associated Diagnoses: Malignant lymphoma, undifferentiated cell, non-Burkitt's (HCC)    clonazePAM (KLONOPIN) 1 MG tablet Take 1 tablet (1 mg total) by mouth at bedtime. Qty: 5 tablet, Refills: 0    DULoxetine (CYMBALTA) 60 MG capsule Take 1 capsule (60 mg total) by mouth every  morning. Qty: 30 capsule, Refills: 3    hydrochlorothiazide (MICROZIDE) 12.5 MG capsule Take 12.5 mg by mouth daily. Refills: 5    interferon beta-1a (AVONEX) 30 MCG/0.5ML PSKT injection Inject 30 mcg into the muscle every Monday.    lisinopril (PRINIVIL,ZESTRIL) 5 MG tablet Take 1 tablet (5 mg total) by mouth daily. Qty: 30 tablet, Refills: 1    methylphenidate (RITALIN) 10 MG tablet Take 1 tablet (10 mg total) by mouth 2 (two) times daily. Qty: 30 tablet, Refills: 0    Multiple Vitamin (MULTIVITAMIN WITH MINERALS)  TABS tablet Take 1 tablet by mouth daily.    QUEtiapine Fumarate (SEROQUEL XR) 150 MG 24 hr tablet Take 150 mg by mouth at bedtime.  Refills: 4   Associated Diagnoses: Follicular lymphoma of intra-abdominal lymph nodes, unspecified follicular lymphoma type ([place]); Visit for monitoring Rituxan therapy    solifenacin (VESICARE) 10 MG tablet Take 10 mg by mouth daily.     tiZANidine (ZANAFLEX) 4 MG tablet Take 1 tablet (4 mg total) by mouth 4 (four) times daily. TAKES FOR MS Qty: 30 tablet, Refills: 0    topiramate (TOPAMAX) 50 MG tablet Take 1 tablet (50 mg total) by mouth daily. Qty: 30 tablet, Refills: 0    vitamin C (ASCORBIC ACID) 500 MG tablet Take 500 mg by mouth daily.    BIOTIN PO Take by mouth daily. Pt taking 500 mcg daily    Diapers & Supplies (HUGGIES PULL-UPS) MISC 1 each by Does not apply route 2 (two) times daily. Dx urinary incontinence, MS; LON 99 months Qty: 60 each, Refills: 11    fluticasone furoate-vilanterol (BREO ELLIPTA) 100-25 MCG/INH AEPB Inhale 1 puff into the lungs daily. Qty: 1 each, Refills: 11    lidocaine-prilocaine (EMLA) cream Apply 1 application topically as needed. apply cream over the port site and then place small piece of saran wrap over the cream to protect your clothing Qty: 30 g, Refills: 1    polyethylene glycol powder (GLYCOLAX/MIRALAX) powder Take 17 g by mouth daily as needed for mild constipation. Mixed in water   Associated Diagnoses: Constipation due to pain medication    senna-docusate (SENOKOT-S) 8.6-50 MG tablet Take 1 tablet by mouth daily as needed for mild constipation.       STOP taking these medications     tolnaftate (TINACTIN) 1 % cream           Today   CHIEF COMPLAINT:   no issues ready to be discharged home Cellulitis improved    VITAL SIGNS:  Blood pressure (!) 121/46, pulse 69, temperature 98.3 F (36.8 C), temperature source Oral, resp. rate 16, height 5\' 4"  (1.626 m), weight 130.6 kg (287 lb 14.4 oz),  SpO2 95 %.   REVIEW OF SYSTEMS:  Review of Systems  Constitutional: Negative.  Negative for chills, fever and malaise/fatigue.  HENT: Negative.  Negative for ear discharge, ear pain, hearing loss, nosebleeds and sore throat.   Eyes: Negative.  Negative for blurred vision and pain.  Respiratory: Negative.  Negative for cough, hemoptysis, shortness of breath and wheezing.   Cardiovascular: Negative.  Negative for chest pain, palpitations and leg swelling.  Gastrointestinal: Negative.  Negative for abdominal pain, blood in stool, diarrhea, nausea and vomiting.  Genitourinary: Negative.  Negative for dysuria.  Musculoskeletal: Negative.  Negative for back pain.  Skin:       cellulitis  Neurological: Negative for dizziness, tremors, speech change, focal weakness, seizures and headaches.  Endo/Heme/Allergies: Negative.  Does not bruise/bleed easily.  Psychiatric/Behavioral: Negative.  Negative for depression, hallucinations and suicidal ideas.     PHYSICAL EXAMINATION:  GENERAL:  66 y.o.-year-old patient obese lying in the bed with no acute distress.  NECK:  Supple, no jugular venous distention. No thyroid enlargement, no tenderness.  LUNGS: Normal breath sounds bilaterally, no wheezing, rales,rhonchi  No use of accessory muscles of respiration.  CARDIOVASCULAR: S1, S2 normal. No murmurs, rubs, or gallops.  ABDOMEN: Soft, non-tender, non-distended. Bowel sounds present. No organomegaly or mass.  EXTREMITIES: No pedal edema, cyanosis, or clubbing.  PSYCHIATRIC: The patient is alert and oriented x 3.  SKIN:left shin cellulitis improved with area on lateral aspect of shin as etiology of cellulitis no discharge noted.   DATA REVIEW:   CBC  Recent Labs Lab 02/08/17 0446  WBC 4.2  HGB 9.6*  HCT 27.9*  PLT 196    Chemistries   Recent Labs Lab 02/06/17 1710  02/08/17 0446  NA 129*  < > 135  K 3.6  < > 3.7  CL 97*  < > 104  CO2 24  < > 24  GLUCOSE 106*  < > 95  BUN 18  < > 15   CREATININE 1.52*  < > 1.28*  CALCIUM 8.9  < > 8.5*  AST 19  --   --   ALT 17  --   --   ALKPHOS 64  --   --   BILITOT 0.5  --   --   < > = values in this interval not displayed.  Cardiac Enzymes  Recent Labs Lab 02/06/17 1710  TROPONINI <0.03    Microbiology Results  @[email or web address] @  RADIOLOGY:  US Venous Img Lower Unilateral Left  Result Date: 02/07/2017 CLINICAL DATA:  Cellulitis and abscess of left lower extremity. History of lymphoma. EXAM: LEFT LOWER EXTREMITY VENOUS DOPPLER ULTRASOUND TECHNIQUE: Gray-scale sonography with graded compression, as well as color Doppler and duplex ultrasound were performed to evaluate the lower extremity deep venous systems from the level of the common femoral vein and including the common femoral, femoral, profunda femoral, popliteal and calf veins including the posterior tibial, peroneal and gastrocnemius veins when visible. The superficial great saphenous vein was also interrogated. Spectral Doppler was utilized to evaluate flow at rest and with distal augmentation maneuvers in the common femoral, femoral and popliteal veins. COMPARISON:  12/29/2016 FINDINGS: Contralateral Common Femoral Vein: Respiratory phasicity is normal and symmetric with the symptomatic side. No evidence of thrombus. Normal compressibility. Common Femoral Vein: No evidence of thrombus. Normal compressibility, respiratory phasicity and response to augmentation. Saphenofemoral Junction: No evidence of thrombus. Normal compressibility and flow on color Doppler imaging. Profunda Femoral Vein: No evidence of thrombus. Normal compressibility and flow on color Doppler imaging. Femoral Vein: No evidence of thrombus. Normal compressibility, respiratory phasicity and response to augmentation. Popliteal Vein: No evidence of thrombus. Normal compressibility, respiratory phasicity and response to augmentation. Calf Veins: No evidence of thrombus. Normal compressibility and flow on color Doppler  imaging. Superficial Great Saphenous Vein: No evidence of thrombus. Normal compressibility. Venous Reflux:  None. Other Findings: No evidence of superficial thrombophlebitis. Cystic/necrotic portion of a left inguinal lymph node appears smaller compared to the prior study and now measures approximately 3.5 cm in greatest transverse diameter compared to 4.6 cm previously. IMPRESSION: No evidence of left lower extremity deep venous thrombosis. Cystic/necrotic portion of a left inguinal lymph node is smaller compared to a prior study in September. Electronically Signed   By: Aletta Edouard M.D.   On: 02/07/2017  18:17      Current Discharge Medication List    START taking these medications   Details  cephALEXin (KEFLEX) 500 MG capsule Take 1 capsule (500 mg total) by mouth every 12 (twelve) hours. Qty: 12 capsule, Refills: 0    nystatin cream (MYCOSTATIN) Apply topically daily. Qty: 30 g, Refills: 0      CONTINUE these medications which have CHANGED   Details  gabapentin (NEURONTIN) 600 MG tablet Take 1 tablet (600 mg total) by mouth 3 (three) times daily.      CONTINUE these medications which have NOT CHANGED   Details  buPROPion (WELLBUTRIN XL) 300 MG 24 hr tablet Take 300 mg by mouth every morning.    Associated Diagnoses: Malignant lymphoma, undifferentiated cell, non-Burkitt's (HCC)    clonazePAM (KLONOPIN) 1 MG tablet Take 1 tablet (1 mg total) by mouth at bedtime. Qty: 5 tablet, Refills: 0    DULoxetine (CYMBALTA) 60 MG capsule Take 1 capsule (60 mg total) by mouth every morning. Qty: 30 capsule, Refills: 3    hydrochlorothiazide (MICROZIDE) 12.5 MG capsule Take 12.5 mg by mouth daily. Refills: 5    interferon beta-1a (AVONEX) 30 MCG/0.5ML PSKT injection Inject 30 mcg into the muscle every Monday.    lisinopril (PRINIVIL,ZESTRIL) 5 MG tablet Take 1 tablet (5 mg total) by mouth daily. Qty: 30 tablet, Refills: 1    methylphenidate (RITALIN) 10 MG tablet Take 1 tablet (10 mg  total) by mouth 2 (two) times daily. Qty: 30 tablet, Refills: 0    Multiple Vitamin (MULTIVITAMIN WITH MINERALS) TABS tablet Take 1 tablet by mouth daily.    QUEtiapine Fumarate (SEROQUEL XR) 150 MG 24 hr tablet Take 150 mg by mouth at bedtime.  Refills: 4   Associated Diagnoses: Follicular lymphoma of intra-abdominal lymph nodes, unspecified follicular lymphoma type ([place]); Visit for monitoring Rituxan therapy    solifenacin (VESICARE) 10 MG tablet Take 10 mg by mouth daily.     tiZANidine (ZANAFLEX) 4 MG tablet Take 1 tablet (4 mg total) by mouth 4 (four) times daily. TAKES FOR MS Qty: 30 tablet, Refills: 0    topiramate (TOPAMAX) 50 MG tablet Take 1 tablet (50 mg total) by mouth daily. Qty: 30 tablet, Refills: 0    vitamin C (ASCORBIC ACID) 500 MG tablet Take 500 mg by mouth daily.    BIOTIN PO Take by mouth daily. Pt taking 500 mcg daily    Diapers & Supplies (HUGGIES PULL-UPS) MISC 1 each by Does not apply route 2 (two) times daily. Dx urinary incontinence, MS; LON 99 months Qty: 60 each, Refills: 11    fluticasone furoate-vilanterol (BREO ELLIPTA) 100-25 MCG/INH AEPB Inhale 1 puff into the lungs daily. Qty: 1 each, Refills: 11    lidocaine-prilocaine (EMLA) cream Apply 1 application topically as needed. apply cream over the port site and then place small piece of saran wrap over the cream to protect your clothing Qty: 30 g, Refills: 1    polyethylene glycol powder (GLYCOLAX/MIRALAX) powder Take 17 g by mouth daily as needed for mild constipation. Mixed in water   Associated Diagnoses: Constipation due to pain medication    senna-docusate (SENOKOT-S) 8.6-50 MG tablet Take 1 tablet by mouth daily as needed for mild constipation.       STOP taking these medications     tolnaftate (TINACTIN) 1 % cream           Management plans discussed with the patient and she is in agreement. Stable for discharge   Patient  should follow up with pcp  CODE STATUS:     Code Status  Orders        Start     Ordered   02/06/17 2356  Full code  Continuous     02/06/17 2355    Code Status History    Date Active Date Inactive Code Status Order ID Comments User Context   01/02/2017  7:03 PM 01/06/2017  6:48 PM Full Code [id number]  [name], MD ED   12/29/2016 12:52 PM 12/30/2016  5:22 PM Full Code [id number]  Nicholes Mango, MD Inpatient   10/11/2016 11:20 PM 10/14/2016  5:26 PM Full Code [id number]  Theodoro Grist, MD Inpatient   08/28/2016 11:59 AM 08/30/2016  7:32 PM Full Code [id number]  Loletha Grayer, MD ED   12/18/2015  7:56 PM 12/19/2015  7:39 PM Full Code [id number]  Hower, Aaron Mose, MD ED      TOTAL TIME TAKING CARE OF THIS PATIENT: 38 minutes.    Note: This dictation was prepared with Dragon dictation along with smaller phrase technology. Any transcriptional errors that result from this process are unintentional.  Leolia Vinzant M.D on 02/09/2017 at 8:42 AM  Between 7am to 6pm - Pager - [phone] After 6pm go to [email or web address] - password [email or web address] [facility]  Office  [phone]  CC: Primary care physician; Arnetha Courser, MD

## 2017-02-09 NOTE — Progress Notes (Signed)
Pt being discharged home with home health, PT recommended SNF but pt declined, discharge instructions and precriptions reviewed with pt and caregiver, states understanding, pt with no complaints

## 2017-02-10 ENCOUNTER — Encounter: Payer: Self-pay | Admitting: *Deleted

## 2017-02-10 ENCOUNTER — Other Ambulatory Visit: Payer: Self-pay | Admitting: *Deleted

## 2017-02-10 ENCOUNTER — Telehealth: Payer: Self-pay

## 2017-02-10 LAB — BLOOD GAS, VENOUS
Acid-Base Excess: 3 mmol/L — ABNORMAL HIGH (ref 0.0–2.0)
Bicarbonate: 28.5 mmol/L — ABNORMAL HIGH (ref 20.0–28.0)
Patient temperature: 37
pCO2, Ven: 47 mmHg (ref 44.0–60.0)
pH, Ven: 7.39 (ref 7.250–7.430)
pO2, Ven: UNDETERMINED mmHg (ref 32.0–45.0)

## 2017-02-10 NOTE — Patient Outreach (Signed)
[facility]) Care Management [place] Telephone Outreach  02/10/2017  [name] 1950/08/08 [id number]  Successful telephone outreach to [name], 66 y/o female referred to [facility] after multiple recent hospitalizations; patient was most recently hospitalized October 11-14, 2018 for UTI, hypotension, fall, cellulitis, and hyponatremia/ ARI.  Patient was discharged home to self-care after refusing SNF placement for rehabilitation; home health [facility]) services are in place through [facility] for RN/ PT/aide and CSW.  Patient has history including, but not limited to, MS with neurogenic bladder (has permanent indwelling foley catheter), and COPD.  HIPAA/ identity verified with patient during phone call today.  Today, patient reports that she "is doing better," and she denies pain and sounds to be in no apparent/ obvious distress throughout entirety of phone call this afternoon.  Reports she is lying down resting, and that her husband has gone out to obtain her prescription antibiotics.  I explained that I was contacting patient for primary [facility] RN CM [name]), and patient verbalized understanding of same.  Patient further reports: -- has medications and is taking all as prescribed post-hospital discharge; patient stated that her husband has gone out now to obtain her antibiotic prescribed at the time of hospital discharge on 02/09/17; we discussed dosing and scheduling of the antibiotic, as well as dosing changes around her maintenance gabapentin; patient reported that she thought she was supposed to be only taking 300 mg gabapentin TID, however, discharge instructions indicate that she should be taking 600 mg TID.  Patient stated that she has her hospital discharge instructions "in the other room" and will double check this when she gets up; currently declines medication reconciliation during phone call. -- reports that she has heard from PCP office staff today,  but has not yet scheduled a post-hospital discharge follow up appointment; encouraged patient to do so promptly, and she verbalized understanding and agreement, stating she will do "sometime later this week." -- reports plans to attend oncology "cancer center" office visit on Thursday, February 12, 2017, stating family will provide her with transportation to appointment -- reports that her family has heard from home health agency and spoken to them regarding establishing home visits; reports "someone from [facility] is supposed to come visit tomorrow;" although patient did not know whether this scheduled visit was to be by a PT or nurse; confirms that she has the number for the [facility] agency -- denies fever, chills, episodes of dizziness; encouraged patient to contact her PCP promptly should any of these symptoms arise and for any concerns or issues that arise, and she verbalized understanding and agreement. -- denies concerns with indwelling foley catheter; states she "has had it for a long time," and understands how to care for foley catheter; denies questions regarding care of foley catheter -- reports is not regularly elevating her legs, as instructed on discharge instruction paperwork, stating that she "was never told" to elevate her legs."  I explained that this item was on her discharge instructions, and encouraged her to begin elevating her lower extremities as much as possible to help with swelling, and she reported that she was doing this "during the night," and would start trying to do "more" during the day time.  Patient denies further issues, concerns, or problems today.  I provided patient with my direct phone number, and confirmed that she had the main [place] office phone number, and the [facility] CM 24-hour nurse advice phone number should issues arise prior to next scheduled  [place] outreach.  I shared with patient that [facility] would contact patient again next week but that patient could  contact me in the meantime if she had concerns arise; patient verbalized understanding and agreement with this plan.  Plan:  Will update patient's primary [facility] RN CM of today's successful telephone outreach to patient  Oneta Rack, RN, BSN, [place] Coordinator [facility] Care Management  [phone]

## 2017-02-10 NOTE — Telephone Encounter (Signed)
Transition Care Management Follow-Up Telephone Call   Date discharged and where: [facility] on 02/09/17  How have you been since you were released from the hospital? Recovering and states wound is looking better. Pt declined fever, drainage, pain, warmth, pus at site or  N/V/D.  Any patient concerns? None  Items Reviewed:   Meds: clarified new meds  Allergies: clarified  Dietary Changes Reviewed: Cardiac diet  Functional Questionnaire:  Independent-I Dependent-D  ADLs:   Dressing- I    Eating- I   Maintaining continence- Pt is having bowel leakage, possibly due to antibiotic. I recommended for pt to take Rx with food, eat yogurt or start an OTC probiotic while on Rx.    Transferring- I   Transportation- D   Meal Prep- D   Managing Meds- I  Confirmed importance and Date/Time of follow-up visits scheduled: NONE- advised pt to call and set this f/u apt today.   Confirmed with patient if condition worsens to call PCP or go to the Emergency Dept. Patient was given office number and encouraged to call back with questions or concerns: YES

## 2017-02-11 ENCOUNTER — Telehealth: Payer: Self-pay

## 2017-02-11 DIAGNOSIS — Z87440 Personal history of urinary (tract) infections: Secondary | ICD-10-CM | POA: Diagnosis not present

## 2017-02-11 DIAGNOSIS — I129 Hypertensive chronic kidney disease with stage 1 through stage 4 chronic kidney disease, or unspecified chronic kidney disease: Secondary | ICD-10-CM | POA: Diagnosis not present

## 2017-02-11 DIAGNOSIS — C8293 Follicular lymphoma, unspecified, intra-abdominal lymph nodes: Secondary | ICD-10-CM | POA: Diagnosis not present

## 2017-02-11 DIAGNOSIS — M1990 Unspecified osteoarthritis, unspecified site: Secondary | ICD-10-CM | POA: Diagnosis not present

## 2017-02-11 DIAGNOSIS — G629 Polyneuropathy, unspecified: Secondary | ICD-10-CM | POA: Diagnosis not present

## 2017-02-11 DIAGNOSIS — Z466 Encounter for fitting and adjustment of urinary device: Secondary | ICD-10-CM | POA: Diagnosis not present

## 2017-02-11 DIAGNOSIS — M5416 Radiculopathy, lumbar region: Secondary | ICD-10-CM | POA: Diagnosis not present

## 2017-02-11 DIAGNOSIS — M419 Scoliosis, unspecified: Secondary | ICD-10-CM | POA: Diagnosis not present

## 2017-02-11 DIAGNOSIS — G35 Multiple sclerosis: Secondary | ICD-10-CM | POA: Diagnosis not present

## 2017-02-11 DIAGNOSIS — J449 Chronic obstructive pulmonary disease, unspecified: Secondary | ICD-10-CM | POA: Diagnosis not present

## 2017-02-11 DIAGNOSIS — N183 Chronic kidney disease, stage 3 (moderate): Secondary | ICD-10-CM | POA: Diagnosis not present

## 2017-02-11 DIAGNOSIS — I739 Peripheral vascular disease, unspecified: Secondary | ICD-10-CM | POA: Diagnosis not present

## 2017-02-11 LAB — CULTURE, BLOOD (ROUTINE X 2)
Culture: NO GROWTH
Culture: NO GROWTH
Special Requests: ADEQUATE

## 2017-02-11 NOTE — Telephone Encounter (Signed)
Reviewed physical exam section of discharge summary, and other aspects; I think that will be fine Please give them approval for requested orders Thank you

## 2017-02-11 NOTE — Telephone Encounter (Signed)
Sent home from hospital, was discharged with UTI sepsis. They received referral for continued  Home health care through [facility].  Nurse states patient has wound on leg from cellulitis wants a order for uni boot 2x per week for 2 weeks, also order for catheter changes monthly.  Sharyn Lull [phone]

## 2017-02-12 ENCOUNTER — Telehealth: Payer: Self-pay

## 2017-02-12 DIAGNOSIS — M5416 Radiculopathy, lumbar region: Secondary | ICD-10-CM | POA: Diagnosis not present

## 2017-02-12 DIAGNOSIS — M419 Scoliosis, unspecified: Secondary | ICD-10-CM | POA: Diagnosis not present

## 2017-02-12 DIAGNOSIS — Z466 Encounter for fitting and adjustment of urinary device: Secondary | ICD-10-CM | POA: Diagnosis not present

## 2017-02-12 DIAGNOSIS — M1990 Unspecified osteoarthritis, unspecified site: Secondary | ICD-10-CM | POA: Diagnosis not present

## 2017-02-12 DIAGNOSIS — J449 Chronic obstructive pulmonary disease, unspecified: Secondary | ICD-10-CM | POA: Diagnosis not present

## 2017-02-12 DIAGNOSIS — C8293 Follicular lymphoma, unspecified, intra-abdominal lymph nodes: Secondary | ICD-10-CM | POA: Diagnosis not present

## 2017-02-12 DIAGNOSIS — Z87440 Personal history of urinary (tract) infections: Secondary | ICD-10-CM | POA: Diagnosis not present

## 2017-02-12 DIAGNOSIS — N183 Chronic kidney disease, stage 3 (moderate): Secondary | ICD-10-CM | POA: Diagnosis not present

## 2017-02-12 DIAGNOSIS — G35 Multiple sclerosis: Secondary | ICD-10-CM | POA: Diagnosis not present

## 2017-02-12 DIAGNOSIS — I129 Hypertensive chronic kidney disease with stage 1 through stage 4 chronic kidney disease, or unspecified chronic kidney disease: Secondary | ICD-10-CM | POA: Diagnosis not present

## 2017-02-12 DIAGNOSIS — I739 Peripheral vascular disease, unspecified: Secondary | ICD-10-CM | POA: Diagnosis not present

## 2017-02-12 DIAGNOSIS — G629 Polyneuropathy, unspecified: Secondary | ICD-10-CM | POA: Diagnosis not present

## 2017-02-12 NOTE — Telephone Encounter (Signed)
That's fine; we need to document the diagnosis or symptom please; thank you

## 2017-02-12 NOTE — Telephone Encounter (Signed)
PT called about getting orders for PT and OT? 8 total visits from [facility] [phone].

## 2017-02-12 NOTE — Telephone Encounter (Signed)
Nurse notidfied

## 2017-02-12 NOTE — Telephone Encounter (Signed)
PT matt notified and dx is bilateral leg weakness

## 2017-02-13 ENCOUNTER — Emergency Department
Admission: EM | Admit: 2017-02-13 | Discharge: 2017-02-13 | Disposition: A | Discharge status: Home / Self Care | Payer: Medicare Other | Source: Home / Self Care | Attending: Emergency Medicine | Admitting: Emergency Medicine

## 2017-02-13 ENCOUNTER — Ambulatory Visit: Payer: Medicare Other | Admitting: Radiation Oncology

## 2017-02-13 ENCOUNTER — Encounter: Payer: Self-pay | Admitting: Emergency Medicine

## 2017-02-13 DIAGNOSIS — M7989 Other specified soft tissue disorders: Secondary | ICD-10-CM | POA: Insufficient documentation

## 2017-02-13 DIAGNOSIS — I10 Essential (primary) hypertension: Secondary | ICD-10-CM | POA: Diagnosis not present

## 2017-02-13 DIAGNOSIS — L03116 Cellulitis of left lower limb: Secondary | ICD-10-CM

## 2017-02-13 DIAGNOSIS — M6281 Muscle weakness (generalized): Secondary | ICD-10-CM | POA: Diagnosis not present

## 2017-02-13 DIAGNOSIS — L03115 Cellulitis of right lower limb: Secondary | ICD-10-CM | POA: Diagnosis not present

## 2017-02-13 DIAGNOSIS — Z7951 Long term (current) use of inhaled steroids: Secondary | ICD-10-CM | POA: Diagnosis not present

## 2017-02-13 DIAGNOSIS — S92504A Nondisplaced unspecified fracture of right lesser toe(s), initial encounter for closed fracture: Secondary | ICD-10-CM | POA: Diagnosis not present

## 2017-02-13 DIAGNOSIS — Z23 Encounter for immunization: Secondary | ICD-10-CM | POA: Diagnosis not present

## 2017-02-13 DIAGNOSIS — Z79899 Other long term (current) drug therapy: Secondary | ICD-10-CM

## 2017-02-13 DIAGNOSIS — Z466 Encounter for fitting and adjustment of urinary device: Secondary | ICD-10-CM | POA: Diagnosis not present

## 2017-02-13 DIAGNOSIS — G35 Multiple sclerosis: Secondary | ICD-10-CM | POA: Diagnosis not present

## 2017-02-13 DIAGNOSIS — N139 Obstructive and reflux uropathy, unspecified: Secondary | ICD-10-CM | POA: Diagnosis not present

## 2017-02-13 DIAGNOSIS — N179 Acute kidney failure, unspecified: Secondary | ICD-10-CM | POA: Diagnosis not present

## 2017-02-13 DIAGNOSIS — K5900 Constipation, unspecified: Secondary | ICD-10-CM | POA: Diagnosis not present

## 2017-02-13 DIAGNOSIS — Z95828 Presence of other vascular implants and grafts: Secondary | ICD-10-CM | POA: Diagnosis not present

## 2017-02-13 DIAGNOSIS — Z7401 Bed confinement status: Secondary | ICD-10-CM | POA: Diagnosis not present

## 2017-02-13 DIAGNOSIS — I2729 Other secondary pulmonary hypertension: Secondary | ICD-10-CM | POA: Diagnosis not present

## 2017-02-13 DIAGNOSIS — L0390 Cellulitis, unspecified: Secondary | ICD-10-CM | POA: Diagnosis not present

## 2017-02-13 DIAGNOSIS — C8290 Follicular lymphoma, unspecified, unspecified site: Secondary | ICD-10-CM | POA: Diagnosis not present

## 2017-02-13 DIAGNOSIS — J449 Chronic obstructive pulmonary disease, unspecified: Secondary | ICD-10-CM

## 2017-02-13 DIAGNOSIS — C8293 Follicular lymphoma, unspecified, intra-abdominal lymph nodes: Secondary | ICD-10-CM | POA: Diagnosis not present

## 2017-02-13 DIAGNOSIS — M419 Scoliosis, unspecified: Secondary | ICD-10-CM | POA: Diagnosis not present

## 2017-02-13 DIAGNOSIS — Y92009 Unspecified place in unspecified non-institutional (private) residence as the place of occurrence of the external cause: Secondary | ICD-10-CM | POA: Diagnosis not present

## 2017-02-13 DIAGNOSIS — I739 Peripheral vascular disease, unspecified: Secondary | ICD-10-CM | POA: Diagnosis not present

## 2017-02-13 DIAGNOSIS — T370X5A Adverse effect of sulfonamides, initial encounter: Secondary | ICD-10-CM | POA: Diagnosis not present

## 2017-02-13 DIAGNOSIS — N183 Chronic kidney disease, stage 3 (moderate): Secondary | ICD-10-CM | POA: Insufficient documentation

## 2017-02-13 DIAGNOSIS — S92511A Displaced fracture of proximal phalanx of right lesser toe(s), initial encounter for closed fracture: Secondary | ICD-10-CM | POA: Diagnosis not present

## 2017-02-13 DIAGNOSIS — Z87891 Personal history of nicotine dependence: Secondary | ICD-10-CM | POA: Insufficient documentation

## 2017-02-13 DIAGNOSIS — K5903 Drug induced constipation: Secondary | ICD-10-CM | POA: Diagnosis not present

## 2017-02-13 DIAGNOSIS — I129 Hypertensive chronic kidney disease with stage 1 through stage 4 chronic kidney disease, or unspecified chronic kidney disease: Secondary | ICD-10-CM

## 2017-02-13 DIAGNOSIS — M5416 Radiculopathy, lumbar region: Secondary | ICD-10-CM | POA: Diagnosis not present

## 2017-02-13 DIAGNOSIS — G629 Polyneuropathy, unspecified: Secondary | ICD-10-CM | POA: Diagnosis not present

## 2017-02-13 DIAGNOSIS — Z87440 Personal history of urinary (tract) infections: Secondary | ICD-10-CM | POA: Diagnosis not present

## 2017-02-13 DIAGNOSIS — M1990 Unspecified osteoarthritis, unspecified site: Secondary | ICD-10-CM | POA: Diagnosis not present

## 2017-02-13 DIAGNOSIS — I700 Atherosclerosis of aorta: Secondary | ICD-10-CM | POA: Diagnosis not present

## 2017-02-13 DIAGNOSIS — S99921A Unspecified injury of right foot, initial encounter: Secondary | ICD-10-CM | POA: Diagnosis not present

## 2017-02-13 DIAGNOSIS — S91114A Laceration without foreign body of right lesser toe(s) without damage to nail, initial encounter: Secondary | ICD-10-CM | POA: Diagnosis not present

## 2017-02-13 DIAGNOSIS — R296 Repeated falls: Secondary | ICD-10-CM | POA: Diagnosis not present

## 2017-02-13 DIAGNOSIS — W19XXXA Unspecified fall, initial encounter: Secondary | ICD-10-CM | POA: Diagnosis not present

## 2017-02-13 LAB — CBC
HCT: 35.7 % (ref 35.0–47.0)
Hemoglobin: 11.9 g/dL — ABNORMAL LOW (ref 12.0–16.0)
MCH: 29.4 pg (ref 26.0–34.0)
MCHC: 33.5 g/dL (ref 32.0–36.0)
MCV: 87.7 fL (ref 80.0–100.0)
Platelets: 277 10*3/uL (ref 150–440)
RBC: 4.07 MIL/uL (ref 3.80–5.20)
RDW: 17.6 % — ABNORMAL HIGH (ref 11.5–14.5)
WBC: 5.2 10*3/uL (ref 3.6–11.0)

## 2017-02-13 LAB — BASIC METABOLIC PANEL
Anion gap: 10 (ref 5–15)
BUN: 17 mg/dL (ref 6–20)
CO2: 25 mmol/L (ref 22–32)
Calcium: 9.7 mg/dL (ref 8.9–10.3)
Chloride: 104 mmol/L (ref 101–111)
Creatinine, Ser: 1.39 mg/dL — ABNORMAL HIGH (ref 0.44–1.00)
GFR calc Af Amer: 45 mL/min — ABNORMAL LOW (ref >60)
GFR calc non Af Amer: 39 mL/min — ABNORMAL LOW (ref >60)
Glucose, Bld: 106 mg/dL — ABNORMAL HIGH (ref 65–99)
Potassium: 4.3 mmol/L (ref 3.5–5.1)
Sodium: 139 mmol/L (ref 135–145)

## 2017-02-13 MED ORDER — SULFAMETHOXAZOLE-TRIMETHOPRIM 800-160 MG PO TABS: 1.0000 | ORAL_TABLET | Freq: Two times a day (BID) | ORAL | 0 refills | Status: DC

## 2017-02-13 NOTE — ED Triage Notes (Signed)
Pt was recently discharged, home health nurse came by and seen that pts left leg was red and swollen. There is a small open area on leg that is weeping.

## 2017-02-13 NOTE — ED Provider Notes (Signed)
[facility] Emergency Department Provider Note   ____________________________________________    I have reviewed the triage vital signs and the nursing notes.   HISTORY  Chief Complaint Leg Swelling     HPI [name] is a 66 y.o. female Who presents with complaints of redness to her left leg. She denies pain to the area. She reports it is better now in the emergency department. She denies fevers or chills. She notes that her home health nurse pointed out today. Patient was recently in the hospital for urinary tract infection and also cellulitis. She is currently taking Keflex.   Past Medical History:  Diagnosis Date  . Abdominal aortic atherosclerosis ([place]) 11/11/2016  . ADHD   . Anxiety   . COPD (chronic obstructive pulmonary disease) ([place])   . Depression    major depressive  . Dyspnea    doe  . Edema    left leg  . Follicular lymphoma ([place])    B Cell  . Follicular lymphoma grade II ([place])   . Hypertension   . Hypotension    idiopathic  . Kyphoscoliosis and scoliosis 11/26/2011  . Morbid obesity ([place]) 01/05/2016  . Multiple sclerosis ([place])   . Multiple sclerosis ([place])    1980's  . Neuromuscular disorder ([place])   . Obstructive and reflux uropathy    foley  . Pain    atypical facial  . Peripheral vascular disease of lower extremity with ulceration ([place]) 11/08/2015  . Skin ulcer ([place]) 11/08/2015  . Weakness    generalized. has MS    Patient Active Problem List   Diagnosis Date Noted  . Sepsis ([place]) 02/06/2017  . Foot pain, bilateral 02/03/2017  . Tinea pedis 02/03/2017  . UTI (urinary tract infection) 01/02/2017  . Cellulitis and abscess of leg 12/29/2016  . Left ovarian cyst 12/09/2016  . Abdominal aortic atherosclerosis ([place]) 11/11/2016  . Cellulitis of left lower extremity 10/11/2016  . Swelling of lower extremity 10/11/2016  . Obstructive sleep apnea 10/11/2016  . Bilateral lower leg cellulitis 10/11/2016  . Follicular  lymphoma of intra-abdominal lymph nodes ([place]) 08/06/2016  . Multiple falls 06/07/2016  . Inguinal adenopathy 05/29/2016  . Breast cancer screening 01/05/2016  . Morbid obesity ([place]) 01/05/2016  . Pressure ulcer 12/19/2015  . Low HDL (under 40) 12/19/2015  . Cellulitis 12/18/2015  . Skin ulcer ([place]) 11/08/2015  . Peripheral vascular disease of lower extremity with ulceration ([place]) 11/08/2015  . CKD (chronic kidney disease) stage 3, GFR 30-59 ml/min ([facility]) 11/08/2015  . Decubitus ulcer of left thigh, stage 2 04/18/2015  . Constipation due to pain medication 01/31/2015  . Obstructive sleep apnea of adult 01/13/2015  . Pelvic muscle wasting 01/13/2015  . Incomplete bladder emptying 01/13/2015  . Headache, migraine 10/24/2014  . 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 Current tobacco use 10/24/2014  . Lumbar radiculopathy, chronic 10/02/2013  . COPD with bronchial hyperresponsiveness ([place]) 10/02/2013  . Major depressive disorder, recurrent, in partial remission ([place]) 10/02/2013  . Hypertension goal BP (blood pressure) < 140/90 10/02/2013  . Multiple sclerosis ([place]) 10/02/2013  . Absence of bladder continence 09/25/2012  . Acontractile bladder 02/12/2012  . Bladder retention 02/12/2012  . Narrowing of intervertebral disc space 11/26/2011  . Kyphoscoliosis and scoliosis 11/26/2011    Past Surgical History:  Procedure Laterality Date  . BACK SURGERY N/A 2002  . CYST EXCISION     lower back  . INGUINAL LYMPH NODE BIOPSY Left 07/04/2016   Procedure: INGUINAL LYMPH NODE BIOPSY;  Surgeon: Christene Lye, MD;  Location: [facility] ORS;  Service: General;  Laterality: Left;  . PORTACATH PLACEMENT N/A 07/22/2016   Procedure: INSERTION PORT-A-CATH;  Surgeon: Christene Lye, MD;  Location: [facility] ORS;  Service: General;  Laterality: N/A;  . TONSILLECTOMY AND ADENOIDECTOMY    . TUBAL LIGATION      Prior to Admission medications   Medication Sig Start Date End Date Taking? Authorizing Provider   BIOTIN PO Take by mouth daily. Pt taking 500 mcg daily   Yes [provider]  buPROPion (WELLBUTRIN XL) 300 MG 24 hr tablet Take 300 mg by mouth every morning.  05/28/16  Yes [provider]  cephALEXin (KEFLEX) 500 MG capsule Take 1 capsule (500 mg total) by mouth every 12 (twelve) hours. 02/09/17 02/15/17 Yes Mody, Ulice Bold, MD  clonazePAM (KLONOPIN) 1 MG tablet Take 1 tablet (1 mg total) by mouth at bedtime. 01/06/17  Yes Sudini, Alveta Heimlich, MD  DULoxetine (CYMBALTA) 60 MG capsule Take 1 capsule (60 mg total) by mouth every morning. 10/14/16  Yes Vaughan Basta, MD  fluticasone furoate-vilanterol (BREO ELLIPTA) 100-25 MCG/INH AEPB Inhale 1 puff into the lungs daily. 01/05/16  Yes Lada, Satira Anis, MD  gabapentin (NEURONTIN) 600 MG tablet Take 1 tablet (600 mg total) by mouth 3 (three) times daily. 02/09/17  Yes Mody, Ulice Bold, MD  hydrochlorothiazide (MICROZIDE) 12.5 MG capsule Take 12.5 mg by mouth daily. 12/19/16  Yes [provider]  lisinopril (PRINIVIL,ZESTRIL) 5 MG tablet Take 1 tablet (5 mg total) by mouth daily. 11/13/16  Yes Lada, Satira Anis, MD  methylphenidate (RITALIN) 10 MG tablet Take 1 tablet (10 mg total) by mouth 2 (two) times daily. 10/14/16  Yes Vaughan Basta, MD  Multiple Vitamin (MULTIVITAMIN WITH MINERALS) TABS tablet Take 1 tablet by mouth daily.   Yes [provider]  QUEtiapine Fumarate (SEROQUEL XR) 150 MG 24 hr tablet Take 150 mg by mouth at bedtime.    Yes [provider]  solifenacin (VESICARE) 10 MG tablet Take 10 mg by mouth daily.    Yes [provider]  tiZANidine (ZANAFLEX) 4 MG tablet Take 1 tablet (4 mg total) by mouth 4 (four) times daily. TAKES FOR MS 10/14/16  Yes Vaughan Basta, MD  topiramate (TOPAMAX) 50 MG tablet Take 1 tablet (50 mg total) by mouth daily. 10/14/16  Yes Vaughan Basta, MD  vitamin C (ASCORBIC ACID) 500 MG tablet Take 500 mg by mouth daily.   Yes [provider]    Diapers & Supplies (HUGGIES PULL-UPS) MISC 1 each by Does not apply route 2 (two) times daily. Dx urinary incontinence, MS; LON 99 months 02/03/17   Arnetha Courser, MD  interferon beta-1a (AVONEX) 30 MCG/0.5ML PSKT injection Inject 30 mcg into the muscle every Monday.    [provider]  lidocaine-prilocaine (EMLA) cream Apply 1 application topically as needed. apply cream over the port site and then place small piece of saran wrap over the cream to protect your clothing 11/07/16   Sindy Guadeloupe, MD  nystatin cream (MYCOSTATIN) Apply topically daily. 02/09/17   Bettey Costa, MD  polyethylene glycol powder (GLYCOLAX/MIRALAX) powder Take 17 g by mouth daily as needed for mild constipation. Mixed in water 01/06/17   Sudini, Alveta Heimlich, MD  senna-docusate (SENOKOT-S) 8.6-50 MG tablet Take 1 tablet by mouth daily as needed for mild constipation.     [provider]  sulfamethoxazole-trimethoprim (BACTRIM DS,SEPTRA DS) 800-160 MG tablet Take 1 tablet by mouth 2 (two) times daily. 02/13/17  Lavonia Drafts, MD     Allergies Patient has no known allergies.  Family History  Problem Relation Age of Onset  . COPD Mother   . Diabetes Mother   . Heart failure Mother   . Alcohol abuse Father   . Kidney disease Father   . Kidney failure Father   . Arthritis Sister   . CAD Maternal Grandmother   . Stroke Maternal Grandfather   . Arthritis Sister   . Mental illness Sister   . Arthritis Brother     Social History Social History  Substance Use Topics  . Smoking status: Former Smoker    Packs/day: 1.00    Years: 20.00    Types: Cigarettes    Quit date: 02/03/2016  . Smokeless tobacco: Never Used  . Alcohol use No    Review of Systems  Constitutional: No fever/chills Eyes: No visual changes.  ENT: No sore throat. Cardiovascular: Denies chest pain. Respiratory: Denies cough Gastrointestinal: No abdominal pain.    Genitourinary: Negative for dysuria. Musculoskeletal: chronic  leg swelling Skin: Negative for rash. Neurological: Negative for headaches   ____________________________________________   PHYSICAL EXAM:  VITAL SIGNS: ED Triage Vitals  Enc Vitals Group     BP 02/13/17 1219 (!) 145/82     Pulse Rate 02/13/17 1219 79     Resp 02/13/17 1219 20     Temp 02/13/17 1219 98.7 F (37.1 C)     Temp src --      SpO2 02/13/17 1219 98 %     Weight 02/13/17 1222 129.3 kg (285 lb)     Height 02/13/17 1222 1.6 m (5\' 3" )     Head Circumference --      Peak Flow --      Pain Score 02/13/17 1225 5     Pain Loc --      Pain Edu? --      Excl. in [place]? --     Constitutional: Alert and oriented. No acute distress. Pleasant and interactive  Nose: No congestion/rhinnorhea.  Cardiovascular: Normal rate, regular rhythm.   Good peripheral circulation. Respiratory: Normal respiratory effort.  No retractions. Gastrointestinal: Soft and nontender. No distention.  No CVA tenderness. Genitourinary: deferred Musculoskeletal:bilateral lower edema, very minimal erythema on the left lower lateral leg extending around what appears to be a small abrasion, no streaking, no fluctuance or evidence of abscess. Neurologic:  Normal speech and language. No gross focal neurologic deficits are appreciated.  Skin:  Skin is warm, dry and intact. as above Psychiatric: Mood and affect are normal. Speech and behavior are normal.  ____________________________________________   LABS (all labs ordered are listed, but only abnormal results are displayed)  Labs Reviewed  CBC - Abnormal; Notable for the following:       Result Value   Hemoglobin 11.9 (*)    RDW 17.6 (*)    All other components within normal limits  BASIC METABOLIC PANEL - Abnormal; Notable for the following:    Glucose, Bld 106 (*)    Creatinine, Ser 1.39 (*)    GFR calc non Af Amer 39 (*)    GFR calc Af Amer 45 (*)    All other components within normal limits    ____________________________________________  EKG  None ____________________________________________  RADIOLOGY  None ____________________________________________   PROCEDURES  Procedure(s) performed: No    Critical Care performed: No ____________________________________________   INITIAL IMPRESSION / ASSESSMENT AND PLAN / ED COURSE  Pertinent labs & imaging results that were available during my  care of the patient were reviewed by me and considered in my medical decision making (see chart for details).  patient's exam is reassuring. Lab work is unremarkable, vitals are unremarkable. I reviewed her medical records from recent hospital stay. We will broaden her antibiotic coverage to include Bactrim and have her follow-up closely with her PCP    ____________________________________________   FINAL CLINICAL IMPRESSION(S) / ED DIAGNOSES  Final diagnoses:  Leg swelling  Cellulitis of left lower extremity      NEW MEDICATIONS STARTED DURING THIS VISIT:  New Prescriptions   SULFAMETHOXAZOLE-TRIMETHOPRIM (BACTRIM DS,SEPTRA DS) 800-160 MG TABLET    Take 1 tablet by mouth 2 (two) times daily.     Note:  This document was prepared using Dragon voice recognition software and may include unintentional dictation errors.    Lavonia Drafts, MD 02/13/17 [phone]

## 2017-02-14 ENCOUNTER — Other Ambulatory Visit: Payer: Self-pay | Admitting: *Deleted

## 2017-02-14 DIAGNOSIS — M419 Scoliosis, unspecified: Secondary | ICD-10-CM | POA: Diagnosis not present

## 2017-02-14 DIAGNOSIS — M5416 Radiculopathy, lumbar region: Secondary | ICD-10-CM | POA: Diagnosis not present

## 2017-02-14 DIAGNOSIS — J449 Chronic obstructive pulmonary disease, unspecified: Secondary | ICD-10-CM | POA: Diagnosis not present

## 2017-02-14 DIAGNOSIS — Z466 Encounter for fitting and adjustment of urinary device: Secondary | ICD-10-CM | POA: Diagnosis not present

## 2017-02-14 DIAGNOSIS — M1990 Unspecified osteoarthritis, unspecified site: Secondary | ICD-10-CM | POA: Diagnosis not present

## 2017-02-14 DIAGNOSIS — I739 Peripheral vascular disease, unspecified: Secondary | ICD-10-CM | POA: Diagnosis not present

## 2017-02-14 DIAGNOSIS — I129 Hypertensive chronic kidney disease with stage 1 through stage 4 chronic kidney disease, or unspecified chronic kidney disease: Secondary | ICD-10-CM | POA: Diagnosis not present

## 2017-02-14 DIAGNOSIS — G35 Multiple sclerosis: Secondary | ICD-10-CM | POA: Diagnosis not present

## 2017-02-14 DIAGNOSIS — N183 Chronic kidney disease, stage 3 (moderate): Secondary | ICD-10-CM | POA: Diagnosis not present

## 2017-02-14 DIAGNOSIS — C8293 Follicular lymphoma, unspecified, intra-abdominal lymph nodes: Secondary | ICD-10-CM | POA: Diagnosis not present

## 2017-02-14 DIAGNOSIS — Z87440 Personal history of urinary (tract) infections: Secondary | ICD-10-CM | POA: Diagnosis not present

## 2017-02-14 DIAGNOSIS — G629 Polyneuropathy, unspecified: Secondary | ICD-10-CM | POA: Diagnosis not present

## 2017-02-14 NOTE — Patient Outreach (Signed)
Unsuccessful telephone encounter to [name], 66 year old female, follow up on recent ED visit 02/13/17 for leg swelling, left leg cellulitis.  Pt also had a recent hospital discharge October 11-14,2018 for Hyponatremia, UTI, renal insufficiency, Sepsis, Cellulitis and abscess of left leg.  Coworker Reginia Naas [facility]  RN CM covering for this RN CM successfully followed up with pt telephonically 02/10/17.   Unable to leave a voice message with pt today  as message on phone voice message not set up yet.     Plan:  RN CM to follow up again telephonically on next business day 02/17/17.     Zara Chess.   [facility]  [phone]

## 2017-02-15 ENCOUNTER — Emergency Department: Payer: Medicare Other

## 2017-02-15 ENCOUNTER — Inpatient Hospital Stay
Admission: EM | Admit: 2017-02-15 | Discharge: 2017-02-17 | DRG: 603 | Disposition: A | Discharge status: Skilled Nursing Facility | Payer: Medicare Other | Attending: Internal Medicine | Admitting: Internal Medicine

## 2017-02-15 DIAGNOSIS — F329 Major depressive disorder, single episode, unspecified: Secondary | ICD-10-CM | POA: Diagnosis present

## 2017-02-15 DIAGNOSIS — R296 Repeated falls: Secondary | ICD-10-CM | POA: Diagnosis present

## 2017-02-15 DIAGNOSIS — Z95828 Presence of other vascular implants and grafts: Secondary | ICD-10-CM | POA: Diagnosis not present

## 2017-02-15 DIAGNOSIS — N183 Chronic kidney disease, stage 3 (moderate): Secondary | ICD-10-CM | POA: Diagnosis present

## 2017-02-15 DIAGNOSIS — S91114A Laceration without foreign body of right lesser toe(s) without damage to nail, initial encounter: Secondary | ICD-10-CM | POA: Diagnosis present

## 2017-02-15 DIAGNOSIS — I739 Peripheral vascular disease, unspecified: Secondary | ICD-10-CM | POA: Diagnosis present

## 2017-02-15 DIAGNOSIS — Z23 Encounter for immunization: Secondary | ICD-10-CM | POA: Diagnosis present

## 2017-02-15 DIAGNOSIS — Z87891 Personal history of nicotine dependence: Secondary | ICD-10-CM | POA: Diagnosis not present

## 2017-02-15 DIAGNOSIS — W19XXXA Unspecified fall, initial encounter: Secondary | ICD-10-CM | POA: Diagnosis present

## 2017-02-15 DIAGNOSIS — F909 Attention-deficit hyperactivity disorder, unspecified type: Secondary | ICD-10-CM | POA: Diagnosis present

## 2017-02-15 DIAGNOSIS — I700 Atherosclerosis of aorta: Secondary | ICD-10-CM | POA: Diagnosis present

## 2017-02-15 DIAGNOSIS — Z7951 Long term (current) use of inhaled steroids: Secondary | ICD-10-CM | POA: Diagnosis not present

## 2017-02-15 DIAGNOSIS — Z79899 Other long term (current) drug therapy: Secondary | ICD-10-CM | POA: Diagnosis not present

## 2017-02-15 DIAGNOSIS — G35 Multiple sclerosis: Secondary | ICD-10-CM | POA: Diagnosis present

## 2017-02-15 DIAGNOSIS — F419 Anxiety disorder, unspecified: Secondary | ICD-10-CM | POA: Diagnosis present

## 2017-02-15 DIAGNOSIS — N179 Acute kidney failure, unspecified: Secondary | ICD-10-CM | POA: Diagnosis present

## 2017-02-15 DIAGNOSIS — Z6841 Body Mass Index (BMI) 40.0 and over, adult: Secondary | ICD-10-CM

## 2017-02-15 DIAGNOSIS — Y92009 Unspecified place in unspecified non-institutional (private) residence as the place of occurrence of the external cause: Secondary | ICD-10-CM

## 2017-02-15 DIAGNOSIS — S99921A Unspecified injury of right foot, initial encounter: Secondary | ICD-10-CM

## 2017-02-15 DIAGNOSIS — I129 Hypertensive chronic kidney disease with stage 1 through stage 4 chronic kidney disease, or unspecified chronic kidney disease: Secondary | ICD-10-CM | POA: Diagnosis present

## 2017-02-15 DIAGNOSIS — L03116 Cellulitis of left lower limb: Secondary | ICD-10-CM | POA: Diagnosis present

## 2017-02-15 DIAGNOSIS — Z7901 Long term (current) use of anticoagulants: Secondary | ICD-10-CM

## 2017-02-15 DIAGNOSIS — J449 Chronic obstructive pulmonary disease, unspecified: Secondary | ICD-10-CM | POA: Diagnosis present

## 2017-02-15 DIAGNOSIS — C8290 Follicular lymphoma, unspecified, unspecified site: Secondary | ICD-10-CM | POA: Diagnosis present

## 2017-02-15 DIAGNOSIS — K5903 Drug induced constipation: Secondary | ICD-10-CM | POA: Diagnosis present

## 2017-02-15 DIAGNOSIS — S92504A Nondisplaced unspecified fracture of right lesser toe(s), initial encounter for closed fracture: Secondary | ICD-10-CM | POA: Diagnosis present

## 2017-02-15 DIAGNOSIS — T370X5A Adverse effect of sulfonamides, initial encounter: Secondary | ICD-10-CM | POA: Diagnosis present

## 2017-02-15 DIAGNOSIS — L03115 Cellulitis of right lower limb: Secondary | ICD-10-CM | POA: Diagnosis present

## 2017-02-15 LAB — URINALYSIS, COMPLETE (UACMP) WITH MICROSCOPIC
Bacteria, UA: NONE SEEN
Bilirubin Urine: NEGATIVE
Glucose, UA: NEGATIVE mg/dL
Hgb urine dipstick: NEGATIVE
Ketones, ur: NEGATIVE mg/dL
Nitrite: NEGATIVE
Protein, ur: 100 mg/dL — AB
Specific Gravity, Urine: 1.015 (ref 1.005–1.030)
pH: 6 (ref 5.0–8.0)

## 2017-02-15 LAB — BASIC METABOLIC PANEL
Anion gap: 8 (ref 5–15)
BUN: 28 mg/dL — ABNORMAL HIGH (ref 6–20)
CO2: 23 mmol/L (ref 22–32)
Calcium: 8.7 mg/dL — ABNORMAL LOW (ref 8.9–10.3)
Chloride: 101 mmol/L (ref 101–111)
Creatinine, Ser: 2.01 mg/dL — ABNORMAL HIGH (ref 0.44–1.00)
GFR calc Af Amer: 29 mL/min — ABNORMAL LOW (ref >60)
GFR calc non Af Amer: 25 mL/min — ABNORMAL LOW (ref >60)
Glucose, Bld: 115 mg/dL — ABNORMAL HIGH (ref 65–99)
Potassium: 4 mmol/L (ref 3.5–5.1)
Sodium: 132 mmol/L — ABNORMAL LOW (ref 135–145)

## 2017-02-15 LAB — CBC WITH DIFFERENTIAL/PLATELET
Basophils Absolute: 0 10*3/uL (ref 0–0.1)
Basophils Relative: 1 %
Eosinophils Absolute: 0.2 10*3/uL (ref 0–0.7)
Eosinophils Relative: 4 %
HCT: 27.9 % — ABNORMAL LOW (ref 35.0–47.0)
Hemoglobin: 9.5 g/dL — ABNORMAL LOW (ref 12.0–16.0)
Lymphocytes Relative: 4 %
Lymphs Abs: 0.2 10*3/uL — ABNORMAL LOW (ref 1.0–3.6)
MCH: 29.8 pg (ref 26.0–34.0)
MCHC: 34 g/dL (ref 32.0–36.0)
MCV: 87.9 fL (ref 80.0–100.0)
Monocytes Absolute: 0.5 10*3/uL (ref 0.2–0.9)
Monocytes Relative: 9 %
Neutro Abs: 4.8 10*3/uL (ref 1.4–6.5)
Neutrophils Relative %: 82 %
Platelets: 195 10*3/uL (ref 150–440)
RBC: 3.18 MIL/uL — ABNORMAL LOW (ref 3.80–5.20)
RDW: 17.8 % — ABNORMAL HIGH (ref 11.5–14.5)
WBC: 5.9 10*3/uL (ref 3.6–11.0)

## 2017-02-15 LAB — LACTIC ACID, PLASMA
Lactic Acid, Venous: 1.1 mmol/L (ref 0.5–1.9)
Lactic Acid, Venous: 1.2 mmol/L (ref 0.5–1.9)

## 2017-02-15 MED ORDER — NYSTATIN 100000 UNIT/GM EX CREA
1.0000 "application " | TOPICAL_CREAM | CUTANEOUS | Status: DC | PRN
  Filled 2017-02-15: qty 15

## 2017-02-15 MED ORDER — FLUTICASONE FUROATE-VILANTEROL 100-25 MCG/INH IN AEPB
1.0000 | INHALATION_SPRAY | Freq: Every day | RESPIRATORY_TRACT | Status: DC
  Administered 2017-02-16 – 2017-02-17 (×2): 1 via RESPIRATORY_TRACT
  Filled 2017-02-15: qty 28

## 2017-02-15 MED ORDER — ONDANSETRON HCL 4 MG/2ML IJ SOLN: 4.0000 mg | Freq: Four times a day (QID) | INTRAMUSCULAR | Status: DC | PRN

## 2017-02-15 MED ORDER — ACETAMINOPHEN 325 MG PO TABS
650.0000 mg | ORAL_TABLET | Freq: Four times a day (QID) | ORAL | Status: DC | PRN
  Administered 2017-02-15 – 2017-02-16 (×3): 650 mg via ORAL
  Filled 2017-02-15 (×2): qty 2

## 2017-02-15 MED ORDER — QUETIAPINE FUMARATE ER 50 MG PO TB24
150.0000 mg | ORAL_TABLET | Freq: Every day | ORAL | Status: DC
  Administered 2017-02-15 – 2017-02-16 (×2): 150 mg via ORAL
  Filled 2017-02-15 (×3): qty 3

## 2017-02-15 MED ORDER — VITAMIN C 500 MG PO TABS
500.0000 mg | ORAL_TABLET | Freq: Every day | ORAL | Status: DC
  Administered 2017-02-16 – 2017-02-17 (×2): 500 mg via ORAL
  Filled 2017-02-15 (×2): qty 1

## 2017-02-15 MED ORDER — DULOXETINE HCL 60 MG PO CPEP
60.0000 mg | ORAL_CAPSULE | ORAL | Status: DC
  Administered 2017-02-16 – 2017-02-17 (×2): 60 mg via ORAL
  Filled 2017-02-15 (×2): qty 1

## 2017-02-15 MED ORDER — SENNOSIDES-DOCUSATE SODIUM 8.6-50 MG PO TABS: 1.0000 | ORAL_TABLET | Freq: Every day | ORAL | Status: DC | PRN

## 2017-02-15 MED ORDER — VANCOMYCIN HCL 10 G IV SOLR
1250.0000 mg | INTRAVENOUS | Status: DC
  Administered 2017-02-15 – 2017-02-16 (×2): 1250 mg via INTRAVENOUS
  Filled 2017-02-15 (×3): qty 1250

## 2017-02-15 MED ORDER — TETANUS-DIPHTH-ACELL PERTUSSIS 5-2.5-18.5 LF-MCG/0.5 IM SUSP
0.5000 mL | Freq: Once | INTRAMUSCULAR | Status: AC
  Administered 2017-02-15: 0.5 mL via INTRAMUSCULAR
  Filled 2017-02-15: qty 0.5

## 2017-02-15 MED ORDER — FENTANYL CITRATE (PF) 100 MCG/2ML IJ SOLN
25.0000 ug | Freq: Once | INTRAMUSCULAR | Status: AC
  Administered 2017-02-15: 25 ug via INTRAVENOUS

## 2017-02-15 MED ORDER — BUPROPION HCL ER (XL) 150 MG PO TB24
300.0000 mg | ORAL_TABLET | ORAL | Status: DC
  Administered 2017-02-16 – 2017-02-17 (×2): 300 mg via ORAL
  Filled 2017-02-15 (×2): qty 2

## 2017-02-15 MED ORDER — TIZANIDINE HCL 4 MG PO TABS
4.0000 mg | ORAL_TABLET | Freq: Four times a day (QID) | ORAL | Status: DC
  Administered 2017-02-15 – 2017-02-17 (×6): 4 mg via ORAL
  Filled 2017-02-15 (×10): qty 1

## 2017-02-15 MED ORDER — FENTANYL CITRATE (PF) 100 MCG/2ML IJ SOLN
INTRAMUSCULAR | Status: AC
  Administered 2017-02-15: 25 ug via INTRAVENOUS
  Filled 2017-02-15: qty 2

## 2017-02-15 MED ORDER — HEPARIN SODIUM (PORCINE) 5000 UNIT/ML IJ SOLN
5000.0000 [IU] | Freq: Three times a day (TID) | INTRAMUSCULAR | Status: DC
  Administered 2017-02-15 – 2017-02-17 (×5): 5000 [IU] via SUBCUTANEOUS
  Filled 2017-02-15 (×5): qty 1

## 2017-02-15 MED ORDER — METHYLPHENIDATE HCL 5 MG PO TABS
10.0000 mg | ORAL_TABLET | Freq: Two times a day (BID) | ORAL | Status: DC
  Administered 2017-02-16 – 2017-02-17 (×3): 10 mg via ORAL
  Filled 2017-02-15 (×4): qty 2

## 2017-02-15 MED ORDER — DARIFENACIN HYDROBROMIDE ER 7.5 MG PO TB24
7.5000 mg | ORAL_TABLET | Freq: Every day | ORAL | Status: DC
  Administered 2017-02-16 – 2017-02-17 (×2): 7.5 mg via ORAL
  Filled 2017-02-15 (×2): qty 1

## 2017-02-15 MED ORDER — CEFTRIAXONE SODIUM IN DEXTROSE 20 MG/ML IV SOLN
1.0000 g | Freq: Once | INTRAVENOUS | Status: AC
  Administered 2017-02-15: 1 g via INTRAVENOUS
  Filled 2017-02-15: qty 50

## 2017-02-15 MED ORDER — CLONAZEPAM 0.5 MG PO TABS
1.0000 mg | ORAL_TABLET | Freq: Every day | ORAL | Status: DC
  Administered 2017-02-15 – 2017-02-16 (×2): 1 mg via ORAL
  Filled 2017-02-15 (×2): qty 2

## 2017-02-15 MED ORDER — VANCOMYCIN HCL IN DEXTROSE 1-5 GM/200ML-% IV SOLN
1000.0000 mg | Freq: Once | INTRAVENOUS | Status: AC
  Administered 2017-02-15: 1000 mg via INTRAVENOUS
  Filled 2017-02-15: qty 200

## 2017-02-15 MED ORDER — TOPIRAMATE 25 MG PO TABS
50.0000 mg | ORAL_TABLET | Freq: Every day | ORAL | Status: DC
  Administered 2017-02-16 – 2017-02-17 (×2): 50 mg via ORAL
  Filled 2017-02-15 (×2): qty 2

## 2017-02-15 MED ORDER — ACETAMINOPHEN 650 MG RE SUPP: 650.0000 mg | Freq: Four times a day (QID) | RECTAL | Status: DC | PRN

## 2017-02-15 MED ORDER — POLYETHYLENE GLYCOL 3350 17 G PO PACK: 17.0000 g | PACK | Freq: Every day | ORAL | Status: DC | PRN

## 2017-02-15 MED ORDER — ONDANSETRON HCL 4 MG PO TABS: 4.0000 mg | ORAL_TABLET | Freq: Four times a day (QID) | ORAL | Status: DC | PRN

## 2017-02-15 MED ORDER — ADULT MULTIVITAMIN W/MINERALS CH
1.0000 | ORAL_TABLET | Freq: Every day | ORAL | Status: DC
  Administered 2017-02-16 – 2017-02-17 (×2): 1 via ORAL
  Filled 2017-02-15 (×2): qty 1

## 2017-02-15 MED ORDER — LISINOPRIL 5 MG PO TABS
5.0000 mg | ORAL_TABLET | Freq: Every day | ORAL | Status: DC
  Administered 2017-02-17: 5 mg via ORAL
  Filled 2017-02-15 (×2): qty 1

## 2017-02-15 MED ORDER — GABAPENTIN 300 MG PO CAPS
600.0000 mg | ORAL_CAPSULE | Freq: Three times a day (TID) | ORAL | Status: DC
  Administered 2017-02-15 – 2017-02-17 (×5): 600 mg via ORAL
  Filled 2017-02-15 (×5): qty 2

## 2017-02-15 MED ORDER — INTERFERON BETA-1A 30 MCG/0.5ML IM PSKT: 30.0000 ug | PREFILLED_SYRINGE | INTRAMUSCULAR | Status: DC

## 2017-02-15 NOTE — ED Provider Notes (Signed)
[facility] Emergency Department Provider Note ____________________________________________   I have reviewed the triage vital signs and the triage nursing note.  HISTORY  Chief Complaint Cellulitis (pt fell yesterday and states cellulitis has spread/worsened since)   Historian Patient and friend in the room  HPI [name] is a 66 y.o. female presents from home with a history of sepsis about 2 weeks ago due to Pseudomonas UTI for which patient is on Keflex, patient was seen in the emergency department 2 days ago for left leg redness and started on Bactrim for cellulitis.  Patient has been extremely weak since going home, actually had a fall and has a laceration beneath her right pinky toe.  She has had much more significant worsening redness and swelling to the left foot left leg and up into the left medial leg.  She is also now having redness consistent with cellulitis on the right lower extremity.  No headache or fever or nausea or vomiting.   Past Medical History:  Diagnosis Date  . Abdominal aortic atherosclerosis ([place]) 11/11/2016  . ADHD   . Anxiety   . COPD (chronic obstructive pulmonary disease) ([place])   . Depression    major depressive  . Dyspnea    doe  . Edema    left leg  . Follicular lymphoma ([place])    B Cell  . Follicular lymphoma grade II ([place])   . Hypertension   . Hypotension    idiopathic  . Kyphoscoliosis and scoliosis 11/26/2011  . Morbid obesity ([place]) 01/05/2016  . Multiple sclerosis ([place])   . Multiple sclerosis ([place])    1980's  . Neuromuscular disorder ([place])   . Obstructive and reflux uropathy    foley  . Pain    atypical facial  . Peripheral vascular disease of lower extremity with ulceration ([place]) 11/08/2015  . Skin ulcer ([place]) 11/08/2015  . Weakness    generalized. has MS    Patient Active Problem List   Diagnosis Date Noted  . Sepsis ([place]) 02/06/2017  . Foot pain, bilateral 02/03/2017  . Tinea pedis 02/03/2017  .  UTI (urinary tract infection) 01/02/2017  . Cellulitis and abscess of leg 12/29/2016  . Left ovarian cyst 12/09/2016  . Abdominal aortic atherosclerosis ([place]) 11/11/2016  . Cellulitis of left lower extremity 10/11/2016  . Swelling of lower extremity 10/11/2016  . Obstructive sleep apnea 10/11/2016  . Bilateral lower leg cellulitis 10/11/2016  . Follicular lymphoma of intra-abdominal lymph nodes ([place]) 08/06/2016  . Multiple falls 06/07/2016  . Inguinal adenopathy 05/29/2016  . Breast cancer screening 01/05/2016  . Morbid obesity ([place]) 01/05/2016  . Pressure ulcer 12/19/2015  . Low HDL (under 40) 12/19/2015  . Cellulitis 12/18/2015  . Skin ulcer ([place]) 11/08/2015  . Peripheral vascular disease of lower extremity with ulceration ([place]) 11/08/2015  . CKD (chronic kidney disease) stage 3, GFR 30-59 ml/min ([facility]) 11/08/2015  . Decubitus ulcer of left thigh, stage 2 04/18/2015  . Constipation due to pain medication 01/31/2015  . Obstructive sleep apnea of adult 01/13/2015  . Pelvic muscle wasting 01/13/2015  . Incomplete bladder emptying 01/13/2015  . Headache, migraine 10/24/2014  . 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 Current tobacco use 10/24/2014  . Lumbar radiculopathy, chronic 10/02/2013  . COPD with bronchial hyperresponsiveness ([place]) 10/02/2013  . Major depressive disorder, recurrent, in partial remission ([place]) 10/02/2013  . Hypertension goal BP (blood pressure) < 140/90 10/02/2013  . Multiple sclerosis ([place]) 10/02/2013  . Absence of bladder continence 09/25/2012  . Acontractile  bladder 02/12/2012  . Bladder retention 02/12/2012  . Narrowing of intervertebral disc space 11/26/2011  . Kyphoscoliosis and scoliosis 11/26/2011    Past Surgical History:  Procedure Laterality Date  . BACK SURGERY N/A 2002  . CYST EXCISION     lower back  . INGUINAL LYMPH NODE BIOPSY Left 07/04/2016   Procedure: INGUINAL LYMPH NODE BIOPSY;  Surgeon: Christene Lye, MD;  Location: [facility] ORS;   Service: General;  Laterality: Left;  . PORTACATH PLACEMENT N/A 07/22/2016   Procedure: INSERTION PORT-A-CATH;  Surgeon: Christene Lye, MD;  Location: [facility] ORS;  Service: General;  Laterality: N/A;  . TONSILLECTOMY AND ADENOIDECTOMY    . TUBAL LIGATION      Prior to Admission medications   Medication Sig Start Date End Date Taking? Authorizing Provider  BIOTIN PO Take by mouth daily. Pt taking 500 mcg daily   Yes [provider]  buPROPion (WELLBUTRIN XL) 300 MG 24 hr tablet Take 300 mg by mouth every morning.  05/28/16  Yes [provider]  cephALEXin (KEFLEX) 500 MG capsule Take 1 capsule (500 mg total) by mouth every 12 (twelve) hours. 02/09/17 02/15/17 Yes Mody, Ulice Bold, MD  clonazePAM (KLONOPIN) 1 MG tablet Take 1 tablet (1 mg total) by mouth at bedtime. 01/06/17  Yes Sudini, Alveta Heimlich, MD  DULoxetine (CYMBALTA) 60 MG capsule Take 1 capsule (60 mg total) by mouth every morning. 10/14/16  Yes Vaughan Basta, MD  fluticasone furoate-vilanterol (BREO ELLIPTA) 100-25 MCG/INH AEPB Inhale 1 puff into the lungs daily. 01/05/16  Yes Lada, Satira Anis, MD  gabapentin (NEURONTIN) 600 MG tablet Take 1 tablet (600 mg total) by mouth 3 (three) times daily. 02/09/17  Yes Mody, Ulice Bold, MD  hydrochlorothiazide (MICROZIDE) 12.5 MG capsule Take 12.5 mg by mouth daily. 12/19/16  Yes [provider]  interferon beta-1a (AVONEX) 30 MCG/0.5ML PSKT injection Inject 30 mcg into the muscle every Monday.   Yes [provider]  lisinopril (PRINIVIL,ZESTRIL) 5 MG tablet Take 1 tablet (5 mg total) by mouth daily. 11/13/16  Yes Lada, Satira Anis, MD  methylphenidate (RITALIN) 10 MG tablet Take 1 tablet (10 mg total) by mouth 2 (two) times daily. 10/14/16  Yes Vaughan Basta, MD  Multiple Vitamin (MULTIVITAMIN WITH MINERALS) TABS tablet Take 1 tablet by mouth daily.   Yes [provider]  QUEtiapine Fumarate (SEROQUEL XR) 150 MG 24 hr tablet Take 150 mg by mouth at  bedtime.    Yes [provider]  solifenacin (VESICARE) 10 MG tablet Take 10 mg by mouth daily.    Yes [provider]  sulfamethoxazole-trimethoprim (BACTRIM DS,SEPTRA DS) 800-160 MG tablet Take 1 tablet by mouth 2 (two) times daily. 02/13/17  Yes Lavonia Drafts, MD  tiZANidine (ZANAFLEX) 4 MG tablet Take 1 tablet (4 mg total) by mouth 4 (four) times daily. TAKES FOR MS 10/14/16  Yes Vaughan Basta, MD  topiramate (TOPAMAX) 50 MG tablet Take 1 tablet (50 mg total) by mouth daily. 10/14/16  Yes Vaughan Basta, MD  vitamin C (ASCORBIC ACID) 500 MG tablet Take 500 mg by mouth daily.   Yes [provider]  Diapers & Supplies (HUGGIES PULL-UPS) MISC 1 each by Does not apply route 2 (two) times daily. Dx urinary incontinence, MS; LON 99 months 02/03/17   Arnetha Courser, MD  lidocaine-prilocaine (EMLA) cream Apply 1 application topically as needed. apply cream over the port site and then place small piece of saran wrap over the cream to protect your clothing 11/07/16   Janese Banks,  West[place] Anna, MD  nystatin cream (MYCOSTATIN) Apply topically daily. 02/09/17   Bettey Costa, MD  polyethylene glycol powder (GLYCOLAX/MIRALAX) powder Take 17 g by mouth daily as needed for mild constipation. Mixed in water 01/06/17   Sudini, Alveta Heimlich, MD  senna-docusate (SENOKOT-S) 8.6-50 MG tablet Take 1 tablet by mouth daily as needed for mild constipation.     [provider]    No Known Allergies  Family History  Problem Relation Age of Onset  . COPD Mother   . Diabetes Mother   . Heart failure Mother   . Alcohol abuse Father   . Kidney disease Father   . Kidney failure Father   . Arthritis Sister   . CAD Maternal Grandmother   . Stroke Maternal Grandfather   . Arthritis Sister   . Mental illness Sister   . Arthritis Brother     Social History Social History  Substance Use Topics  . Smoking status: Former Smoker    Packs/day: 1.00    Years: 20.00    Types: Cigarettes     Quit date: 02/03/2016  . Smokeless tobacco: Never Used  . Alcohol use No    Review of Systems  Constitutional: Negative for fever. Eyes: Negative for visual changes. ENT: Negative for sore throat. Cardiovascular: Negative for chest pain. Respiratory: Negative for shortness of breath. Gastrointestinal: Negative for abdominal pain, vomiting and diarrhea. Genitourinary: Negative for dysuria.  Does have an indwelling Foley catheter. Musculoskeletal: Negative for back pain. Skin: The lightest of both lower extreme is, left much worse than right. Neurological: Negative for headache.  ____________________________________________   PHYSICAL EXAM:  VITAL SIGNS: ED Triage Vitals  Enc Vitals Group     BP 02/15/17 1338 (!) 91/50     Pulse Rate 02/15/17 1338 67     Resp 02/15/17 1338 (!) 21     Temp 02/15/17 1338 98.2 F (36.8 C)     Temp Source 02/15/17 1338 Oral     SpO2 02/15/17 1338 99 %     Weight 02/15/17 1336 285 lb (129.3 kg)     Height 02/15/17 1336 5\' 4"  (1.626 m)     Head Circumference --      Peak Flow --      Pain Score 02/15/17 1335 3     Pain Loc --      Pain Edu? --      Excl. in [place]? --      Constitutional: Alert and oriented. Well appearing and in no distress. HEENT   Head: Normocephalic and atraumatic.      Eyes: Conjunctivae are normal. Pupils equal and round.       Ears:         Nose: No congestion/rhinnorhea.   Mouth/Throat: Mucous membranes are mildly dry.   Neck: No stridor. Cardiovascular/Chest: Normal rate, regular rhythm.  No murmurs, rubs, or gallops. Respiratory: Normal respiratory effort without tachypnea nor retractions. Breath sounds are clear and equal bilaterally. No wheezes/rales/rhonchi. Gastrointestinal: Soft. No distention, no guarding, no rebound. Nontender.  Morbidly obese Genitourinary/rectal:Deferred Musculoskeletal: Right pinky toe has a laceration along the underside right at the crease.  Right leg has some ankle redness.   No calf tenderness.  Left lower extremity has 2+ lower extremity edema with fiery redness consistent with cellulitis from the foot up into the lower leg up into the just medial distal thigh. Neurologic:  Normal speech and language. No gross or focal neurologic deficits are appreciated. Skin: Please see musculoskeletal exam.  Laceration below her  right pinky.  Cellulitic rash to the right leg as well as left foot left leg and left upper leg. Psychiatric: Mood and affect are normal. Speech and behavior are normal. Patient exhibits appropriate insight and judgment.   ____________________________________________  LABS (pertinent positives/negatives) I, Lisa Roca, MD the attending physician have reviewed the labs noted below.  Labs Reviewed  BASIC METABOLIC PANEL - Abnormal; Notable for the following:       Result Value   Sodium 132 (*)    Glucose, Bld 115 (*)    BUN 28 (*)    Creatinine, Ser 2.01 (*)    Calcium 8.7 (*)    GFR calc non Af Amer 25 (*)    GFR calc Af Amer 29 (*)    All other components within normal limits  CBC WITH DIFFERENTIAL/PLATELET - Abnormal; Notable for the following:    RBC 3.18 (*)    Hemoglobin 9.5 (*)    HCT 27.9 (*)    RDW 17.8 (*)    Lymphs Abs 0.2 (*)    All other components within normal limits  URINALYSIS, COMPLETE (UACMP) WITH MICROSCOPIC - Abnormal; Notable for the following:    Color, Urine STRAW (*)    APPearance CLEAR (*)    Glucose, UA 150 (*)    Bacteria, UA FEW (*)    All other components within normal limits  CULTURE, BLOOD (ROUTINE X 2)  CULTURE, BLOOD (ROUTINE X 2)  LACTIC ACID, PLASMA  LACTIC ACID, PLASMA    ____________________________________________    EKG I, Lisa Roca, MD, the attending physician have personally viewed and interpreted all ECGs.  62 bpm.  Normal sinus rhythm.  Narrow QRS.  Left axis deviation.  Nonspecific ST and T wave ____________________________________________  RADIOLOGY All Xrays were viewed by  me.  Imaging interpreted by Radiologist, and I, Lisa Roca, MD the attending physician have reviewed the radiologist interpretation noted below.  None __________________________________________  PROCEDURES  Procedure(s) performed: None  Critical Care performed: None  ____________________________________________  Current Facility-Administered Medications on File Prior to Encounter  Medication Dose Route Frequency Provider Last Rate Last Dose  . heparin lock flush 100 unit/mL  500 Units Intracatheter Once PRN Sindy Guadeloupe, MD       Current Outpatient Prescriptions on File Prior to Encounter  Medication Sig Dispense Refill  . BIOTIN PO Take by mouth daily. Pt taking 500 mcg daily    . buPROPion (WELLBUTRIN XL) 300 MG 24 hr tablet Take 300 mg by mouth every morning.     . cephALEXin (KEFLEX) 500 MG capsule Take 1 capsule (500 mg total) by mouth every 12 (twelve) hours. 12 capsule 0  . clonazePAM (KLONOPIN) 1 MG tablet Take 1 tablet (1 mg total) by mouth at bedtime. 5 tablet 0  . DULoxetine (CYMBALTA) 60 MG capsule Take 1 capsule (60 mg total) by mouth every morning. 30 capsule 3  . fluticasone furoate-vilanterol (BREO ELLIPTA) 100-25 MCG/INH AEPB Inhale 1 puff into the lungs daily. 1 each 11  . gabapentin (NEURONTIN) 600 MG tablet Take 1 tablet (600 mg total) by mouth 3 (three) times daily.    . hydrochlorothiazide (MICROZIDE) 12.5 MG capsule Take 12.5 mg by mouth daily.  5  . interferon beta-1a (AVONEX) 30 MCG/0.5ML PSKT injection Inject 30 mcg into the muscle every Monday.    Marland Kitchen lisinopril (PRINIVIL,ZESTRIL) 5 MG tablet Take 1 tablet (5 mg total) by mouth daily. 30 tablet 1  . methylphenidate (RITALIN) 10 MG tablet Take 1 tablet (10 mg  total) by mouth 2 (two) times daily. 30 tablet 0  . Multiple Vitamin (MULTIVITAMIN WITH MINERALS) TABS tablet Take 1 tablet by mouth daily.    . QUEtiapine Fumarate (SEROQUEL XR) 150 MG 24 hr tablet Take 150 mg by mouth at bedtime.   4  . solifenacin  (VESICARE) 10 MG tablet Take 10 mg by mouth daily.     Marland Kitchen sulfamethoxazole-trimethoprim (BACTRIM DS,SEPTRA DS) 800-160 MG tablet Take 1 tablet by mouth 2 (two) times daily. 14 tablet 0  . tiZANidine (ZANAFLEX) 4 MG tablet Take 1 tablet (4 mg total) by mouth 4 (four) times daily. TAKES FOR MS 30 tablet 0  . topiramate (TOPAMAX) 50 MG tablet Take 1 tablet (50 mg total) by mouth daily. 30 tablet 0  . vitamin C (ASCORBIC ACID) 500 MG tablet Take 500 mg by mouth daily.    . Diapers & Supplies (HUGGIES PULL-UPS) MISC 1 each by Does not apply route 2 (two) times daily. Dx urinary incontinence, MS; LON 99 months 60 each 11  . lidocaine-prilocaine (EMLA) cream Apply 1 application topically as needed. apply cream over the port site and then place small piece of saran wrap over the cream to protect your clothing 30 g 1  . nystatin cream (MYCOSTATIN) Apply topically daily. 30 g 0  . polyethylene glycol powder (GLYCOLAX/MIRALAX) powder Take 17 g by mouth daily as needed for mild constipation. Mixed in water    . senna-docusate (SENOKOT-S) 8.6-50 MG tablet Take 1 tablet by mouth daily as needed for mild constipation.       ____________________________________________  ED COURSE / ASSESSMENT AND PLAN  Pertinent labs & imaging results that were available during my care of the patient were reviewed by me and considered in my medical decision making (see chart for details).   Patient arrives with failed outpatient management of left lower extremely cellulitis which is now significantly worse by report, and now is including her entire foot left lower leg and up into the medial left thigh.  She is also having some cellulitis to the right lower extremity.  At present she does not appear to be septic with no fever no elevated heart rate, however she does have a soft blood pressure with a blood pressure around 100.  Blood cultures as well as lactate as well as other laboratory studies are sent.  Some pain control  be started.  Patient was started on Rocephin IV as well as vancomycin IV for cellulitis.  Given the failed outpatient management and worsening including significant left lower extremity cellulitis, I do think the patient needs observation and treatment in the hospital.  It is quite possible that she is going to need PT evaluation for possible acute care rehab due to how weak she has been.  She did have a fall last night with no additional traumatic injuries other than the right pinky.  I will go ahead and take an x-ray, but there is a laceration there that is now too old to suture.  It is in the crease, so her foot will be cleaned by the nurse and then buddy taped and placed in a hard sole shoe so that it does not open.   WBC is reassuring.  I still think she needs admission for severe worsening and limb threatening amount of cellulitis after failed bactrim.  DVT study pending.  BMP pending at time of hospitalist consultation.  DIFFERENTIAL DIAGNOSIS: Including but not limited to cellulitis, DVT, peripheral edema, dehydration, sepsis, etc.  CONSULTATIONS:  Hospitalist for admission, discussed with Dr. Boyce Medici.   Patient / Family / Caregiver informed of clinical course, medical decision-making process, and agree with plan.   ___________________________________________   FINAL CLINICAL IMPRESSION(S) / ED DIAGNOSES   Final diagnoses:  Cellulitis of left leg  Cellulitis of right leg  Laceration of fifth toe of right foot, initial encounter  Injury of right toe, initial encounter              Note: This dictation was prepared with Dragon dictation. Any transcriptional errors that result from this process are unintentional    Lisa Roca, MD 02/15/17 1542

## 2017-02-15 NOTE — ED Notes (Signed)
Pt returned from [facility].

## 2017-02-15 NOTE — H&P (Signed)
[facility] NAME: [name]    MR#:  [id number]  DATE OF BIRTH:  1951/02/11  DATE OF ADMISSION:  02/15/2017  PRIMARY CARE PHYSICIAN: Arnetha Courser, MD   REQUESTING/REFERRING PHYSICIAN: Dr. Lisa Roca  CHIEF COMPLAINT:   Chief Complaint  Patient presents with  . Cellulitis    pt fell yesterday and states cellulitis has spread/worsened since    HISTORY OF PRESENT ILLNESS:  [name]  is a 66 y.o. female with a known history of anxiety, ADHD, COPD, depression, chronic lower extremity edema, hypertension, history of follicular lymphoma, who presents to the hospital due to worsening left lower extremity redness, swelling, pain. Patient was recently hospitalized for a left lower extremity cellulitis and discharged on oralKeflex,she was not improving and therefore came to the ER 2 days ago and was switched over to oral Bactrim but despite that patient continues to have significant redness of her left lower extremity and therefore brought to the ER for further evaluation. Patient has had no fever, chills, nausea, vomiting or any other associated symptoms. Patient admits to significant weakness and having frequent falls over the past week. She was seen by physical therapy during her previous hospitalization and they recommended short-term rehabilitation but patient did not want to go to rehabilitation and went home with home health services. Hospitalist services were contacted further treatment and evaluation.  PAST MEDICAL HISTORY:   Past Medical History:  Diagnosis Date  . Abdominal aortic atherosclerosis ([place]) 11/11/2016  . ADHD   . Anxiety   . COPD (chronic obstructive pulmonary disease) ([place])   . Depression    major depressive  . Dyspnea    doe  . Edema    left leg  . Follicular lymphoma ([place])    B Cell  . Follicular lymphoma grade II ([place])   . Hypertension   . Hypotension    idiopathic  . Kyphoscoliosis and scoliosis  11/26/2011  . Morbid obesity ([place]) 01/05/2016  . Multiple sclerosis ([place])   . Multiple sclerosis ([place])    1980's  . Neuromuscular disorder ([place])   . Obstructive and reflux uropathy    foley  . Pain    atypical facial  . Peripheral vascular disease of lower extremity with ulceration ([place]) 11/08/2015  . Skin ulcer ([place]) 11/08/2015  . Weakness    generalized. has MS    PAST SURGICAL HISTORY:   Past Surgical History:  Procedure Laterality Date  . BACK SURGERY N/A 2002  . CYST EXCISION     lower back  . INGUINAL LYMPH NODE BIOPSY Left 07/04/2016   Procedure: INGUINAL LYMPH NODE BIOPSY;  Surgeon: Christene Lye, MD;  Location: [facility] ORS;  Service: General;  Laterality: Left;  . PORTACATH PLACEMENT N/A 07/22/2016   Procedure: INSERTION PORT-A-CATH;  Surgeon: Christene Lye, MD;  Location: [facility] ORS;  Service: General;  Laterality: N/A;  . TONSILLECTOMY AND ADENOIDECTOMY    . TUBAL LIGATION      SOCIAL HISTORY:   Social History  Substance Use Topics  . Smoking status: Former Smoker    Packs/day: 1.00    Years: 20.00    Types: Cigarettes    Quit date: 02/03/2016  . Smokeless tobacco: Never Used  . Alcohol use No    FAMILY HISTORY:   Family History  Problem Relation Age of Onset  . COPD Mother   . Diabetes Mother   . Heart failure Mother   . Alcohol abuse Father   .  Kidney disease Father   . Kidney failure Father   . Arthritis Sister   . CAD Maternal Grandmother   . Stroke Maternal Grandfather   . Arthritis Sister   . Mental illness Sister   . Arthritis Brother     DRUG ALLERGIES:  No Known Allergies  REVIEW OF SYSTEMS:   Review of Systems  Constitutional: Negative for fever and weight loss.  HENT: Negative for congestion, nosebleeds and tinnitus.   Eyes: Negative for blurred vision, double vision and redness.  Respiratory: Negative for cough, hemoptysis and shortness of breath.   Cardiovascular: Negative for chest pain, orthopnea, leg swelling and  PND.  Gastrointestinal: Negative for abdominal pain, diarrhea, melena, nausea and vomiting.  Genitourinary: Negative for dysuria, hematuria and urgency.  Musculoskeletal: Negative for falls and joint pain.  Skin: Positive for rash (Left Lower Ext. cellulitis. ).  Neurological: Positive for weakness. Negative for dizziness, tingling, sensory change, focal weakness, seizures and headaches.  Endo/Heme/Allergies: Negative for polydipsia. Does not bruise/bleed easily.  Psychiatric/Behavioral: Negative for depression and memory loss. The patient is not nervous/anxious.     MEDICATIONS AT HOME:   Prior to Admission medications   Medication Sig Start Date End Date Taking? Authorizing Provider  BIOTIN PO Take by mouth daily. Pt taking 500 mcg daily   Yes [provider]  buPROPion (WELLBUTRIN XL) 300 MG 24 hr tablet Take 300 mg by mouth every morning.  05/28/16  Yes [provider]  cephALEXin (KEFLEX) 500 MG capsule Take 1 capsule (500 mg total) by mouth every 12 (twelve) hours. 02/09/17 02/15/17 Yes Mody, Ulice Bold, MD  clonazePAM (KLONOPIN) 1 MG tablet Take 1 tablet (1 mg total) by mouth at bedtime. 01/06/17  Yes Sudini, Alveta Heimlich, MD  DULoxetine (CYMBALTA) 60 MG capsule Take 1 capsule (60 mg total) by mouth every morning. 10/14/16  Yes Vaughan Basta, MD  fluticasone furoate-vilanterol (BREO ELLIPTA) 100-25 MCG/INH AEPB Inhale 1 puff into the lungs daily. 01/05/16  Yes Lada, Satira Anis, MD  gabapentin (NEURONTIN) 600 MG tablet Take 1 tablet (600 mg total) by mouth 3 (three) times daily. 02/09/17  Yes Mody, Ulice Bold, MD  hydrochlorothiazide (MICROZIDE) 12.5 MG capsule Take 12.5 mg by mouth daily. 12/19/16  Yes [provider]  interferon beta-1a (AVONEX) 30 MCG/0.5ML PSKT injection Inject 30 mcg into the muscle every Monday.   Yes [provider]  lisinopril (PRINIVIL,ZESTRIL) 5 MG tablet Take 1 tablet (5 mg total) by mouth daily. 11/13/16  Yes Lada, Satira Anis, MD   methylphenidate (RITALIN) 10 MG tablet Take 1 tablet (10 mg total) by mouth 2 (two) times daily. 10/14/16  Yes Vaughan Basta, MD  Multiple Vitamin (MULTIVITAMIN WITH MINERALS) TABS tablet Take 1 tablet by mouth daily.   Yes [provider]  QUEtiapine Fumarate (SEROQUEL XR) 150 MG 24 hr tablet Take 150 mg by mouth at bedtime.    Yes [provider]  solifenacin (VESICARE) 10 MG tablet Take 10 mg by mouth daily.    Yes [provider]  sulfamethoxazole-trimethoprim (BACTRIM DS,SEPTRA DS) 800-160 MG tablet Take 1 tablet by mouth 2 (two) times daily. 02/13/17  Yes Lavonia Drafts, MD  tiZANidine (ZANAFLEX) 4 MG tablet Take 1 tablet (4 mg total) by mouth 4 (four) times daily. TAKES FOR MS 10/14/16  Yes Vaughan Basta, MD  topiramate (TOPAMAX) 50 MG tablet Take 1 tablet (50 mg total) by mouth daily. 10/14/16  Yes Vaughan Basta, MD  vitamin C (ASCORBIC ACID) 500 MG tablet Take 500 mg by mouth daily.  Yes [provider]  Diapers & Supplies (HUGGIES PULL-UPS) MISC 1 each by Does not apply route 2 (two) times daily. Dx urinary incontinence, MS; LON 99 months 02/03/17   Arnetha Courser, MD  lidocaine-prilocaine (EMLA) cream Apply 1 application topically as needed. apply cream over the port site and then place small piece of saran wrap over the cream to protect your clothing 11/07/16   Sindy Guadeloupe, MD  nystatin cream (MYCOSTATIN) Apply topically daily. 02/09/17   Bettey Costa, MD  polyethylene glycol powder (GLYCOLAX/MIRALAX) powder Take 17 g by mouth daily as needed for mild constipation. Mixed in water 01/06/17   Sudini, Alveta Heimlich, MD  senna-docusate (SENOKOT-S) 8.6-50 MG tablet Take 1 tablet by mouth daily as needed for mild constipation.     [provider]      VITAL SIGNS:  Blood pressure (!) 100/48, pulse (!) 58, temperature 98.2 F (36.8 C), temperature source Oral, resp. rate 17, height 5\' 4"  (1.626 m), weight 129.3 kg (285 lb), SpO2  100 %.  PHYSICAL EXAMINATION:  Physical Exam  GENERAL:  66 y.o.-year-old obese patient lying in the bed with no acute distress.  EYES: Pupils equal, round, reactive to light and accommodation. No scleral icterus. Extraocular muscles intact.  HEENT: Head atraumatic, normocephalic. Oropharynx and nasopharynx clear. No oropharyngeal erythema, moist oral mucosa  NECK:  Supple, no jugular venous distention. No thyroid enlargement, no tenderness.  LUNGS: Normal breath sounds bilaterally, no wheezing, rales, rhonchi. No use of accessory muscles of respiration.  CARDIOVASCULAR: S1, S2 RRR. No murmurs, rubs, gallops, clicks.  ABDOMEN: Soft, nontender, nondistended. Bowel sounds present. No organomegaly or mass.  EXTREMITIES: + 2 edema b/l, No cyanosis, or clubbing. + 2 pedal & radial pulses b/l.   NEUROLOGIC: Cranial nerves II through XII are intact. No focal Motor or sensory deficits appreciated b/l PSYCHIATRIC: The patient is alert and oriented x 3. Good affect.  SKIN: No obvious rash, lesion, or ulcer. Left lower extremity redness swelling and pain consistent with cellulitis.  LABORATORY PANEL:   CBC  Recent Labs Lab 02/15/17 1423  WBC 5.9  HGB 9.5*  HCT 27.9*  PLT 195   ------------------------------------------------------------------------------------------------------------------  Chemistries   Recent Labs Lab 02/15/17 1428  NA 132*  K 4.0  CL 101  CO2 23  GLUCOSE 115*  BUN 28*  CREATININE 2.01*  CALCIUM 8.7*   ------------------------------------------------------------------------------------------------------------------  Cardiac Enzymes No results for input(s): TROPONINI in the last 168 hours. ------------------------------------------------------------------------------------------------------------------  RADIOLOGY:  US Venous Img Lower Unilateral Left  Result Date: 02/15/2017 CLINICAL DATA:  Left leg pain, swelling, and erythema for 2 months. Follicular  lymphoma. EXAM: LEFT LOWER EXTREMITY VENOUS DOPPLER ULTRASOUND TECHNIQUE: Gray-scale sonography with graded compression, as well as color Doppler and duplex ultrasound were performed to evaluate the lower extremity deep venous systems from the level of the common femoral vein and including the common femoral, femoral, profunda femoral, popliteal and calf veins including the posterior tibial, peroneal and gastrocnemius veins when visible. The superficial great saphenous vein was also interrogated. Spectral Doppler was utilized to evaluate flow at rest and with distal augmentation maneuvers in the common femoral, femoral and popliteal veins. COMPARISON:  02/07/2017 FINDINGS: Contralateral Common Femoral Vein: Respiratory phasicity is normal and symmetric with the symptomatic side. No evidence of thrombus. Normal compressibility. Common Femoral Vein: No evidence of thrombus. Normal compressibility, respiratory phasicity and response to augmentation. Saphenofemoral Junction: No evidence of thrombus. Normal compressibility and flow on color Doppler imaging. Profunda Femoral Vein: No evidence of  thrombus. Normal compressibility and flow on color Doppler imaging. Femoral Vein: No evidence of thrombus. Normal compressibility, respiratory phasicity and response to augmentation. Popliteal Vein: No evidence of thrombus. Normal compressibility, respiratory phasicity and response to augmentation. Calf Veins: No evidence of thrombus. Normal compressibility and flow on color Doppler imaging. Superficial Great Saphenous Vein: No evidence of thrombus. Normal compressibility. Venous Reflux:  None. Other Findings: Soft tissue edema is seen within the left groin and lower extremity soft tissues. A focal fluid collection in the left groin and currently measures 2.8 x 2.4 x 2.7 cm, mildly decreased in size since previous study. IMPRESSION: No evidence of deep venous thrombosis. Left groin and lower extremity soft tissue edema. Mild  decrease in size of left groin fluid collection since recent exam. Electronically Signed   By: Earle Gell M.D.   On: 02/15/2017 16:01   Dg Toe 5th Right  Result Date: 02/15/2017 CLINICAL DATA:  Acute right little toe pain following injury. Initial encounter. EXAM: RIGHT FIFTH TOE COMPARISON:  None. FINDINGS: A transverse fracture of the proximal aspect of the little toe proximal phalanx is noted with approximately 2 mm distraction. There is no evidence of subluxation or dislocation. IMPRESSION: Transverse fracture the proximal phalanx with 2 mm distraction. Electronically Signed   By: Margarette Canada M.D.   On: 02/15/2017 16:10     IMPRESSION AND PLAN:   66 year old female with past medical history of multiple sclerosis,COPD, peripheral vascular disease, chronic lower extremity edema, obesity, hypertension, history of follicular lymphoma, anxiety/depression, ADHD who presents to the hospital due to worsening left lower extremity redness swelling and pain.  1. Left lower extremity cellulitis-patient has failed outpatient oral antibiotics with Keflex and Bactrim. We will admit the patient and treat the patient with IV vancomycin. Patient is clinically afebrile with a normal white cell count. Dopplers were repeated in the ER and a negative for DVT still. -Patient does have chronic lower extremity edema would benefit from [facility] boots and therefore we'll consult wound team.  2. Acute on chronic kidney injury-I suspect this is secondary to patient's Bactrim use. We'll follow BUN and creatinine. Follow urine output. Renal dose meds, although nephrotoxins.  3.essential hypertension-continue lisinopril, hold hydrochlorothiazide. 4. Anxiety/depressioncontinue Klonopin, Cymbalta.  5. History of ADHD-continue Ritalin.  6. Multiple sclerosis-continue Avonex. - pt. Follows with Dr. Manuella Ghazi.   7. history of recurrent falls-patient was seen by physical therapy during her previous hospitalization and recommended  short-term rehabilitation but the patient refused it. Patient is now interested in going to rehabilitation. I will reconsult physical therapy and also social work.   All the records are reviewed and case discussed with ED provider. Management plans discussed with the patient, family and they are in agreement.  CODE STATUS: Full code  TOTAL TIME TAKING CARE OF THIS PATIENT: 45 minutes.    Henreitta Leber M.D on 02/15/2017 at 4:18 PM  Between 7am to 6pm - Pager - [phone]  After 6pm go to [email or web address] - password [email or web address] [place] [facility]  Office  [phone]  CC: Primary care physician; Arnetha Courser, MD

## 2017-02-15 NOTE — ED Triage Notes (Signed)
Pt presents to ED from home c/o worsening cellulitis since fall yesterday. Presents with foley catheter from home;currently taking po antibiotics for infection

## 2017-02-15 NOTE — Progress Notes (Signed)
Pharmacy Antibiotic Note  [name] is a 66 y.o. female admitted on 02/15/2017 with cellulitis.  Pharmacy has been consulted for Vancomycin dosing. Patient received vancomycin 1g IV x 1 dose and Ceftriaxone 1g IV x 1 dose in ED.   Plan: Ke: 0.035   T1/2: 19.8  Vd: 90.3  Will start Vancomycin 1250mg  IV every 24 hours, with 6 hour stack dosing. Calculated trough at Css is 12.5. Except vanc accumulation in this patient due to BMI of 49.  Trough ordered prior to 4th dose.  Will monitor renal function and adjust dose as needed.   Height: 5\' 4"  (162.6 cm) Weight: 285 lb (129.3 kg) IBW/kg (Calculated) : 54.7  Temp (24hrs), Avg:98.3 F (36.8 C), Min:98.2 F (36.8 C), Max:98.4 F (36.9 C)   Recent Labs Lab 02/13/17 1357 02/15/17 1423 02/15/17 1428  WBC 5.2 5.9  --   CREATININE 1.39*  --  2.01*  LATICACIDVEN  --  1.1  --     Estimated Creatinine Clearance: 36.7 mL/min (A) (by C-G formula based on SCr of 2.01 mg/dL (H)).    No Known Allergies  Antimicrobials this admission: 10/20 vancomycin  >>   Dose adjustments this admission:   Microbiology results: 10/20  BCx: pending   Thank you for allowing pharmacy to be a part of this patient's care.  Pernell Dupre, PharmD, BCPS Clinical Pharmacist 02/15/2017 6:04 PM

## 2017-02-15 NOTE — ED Notes (Signed)
Pt transported to Room 143 by RN Grayland Ormond

## 2017-02-15 NOTE — Progress Notes (Signed)
Pt admitted to ortho unit room 143. Dressing applied to right foot. Pt comfortable, ordering dinner. Tylenol given for pain.

## 2017-02-16 LAB — CBC
HCT: 27 % — ABNORMAL LOW (ref 35.0–47.0)
Hemoglobin: 9 g/dL — ABNORMAL LOW (ref 12.0–16.0)
MCH: 29.4 pg (ref 26.0–34.0)
MCHC: 33.4 g/dL (ref 32.0–36.0)
MCV: 88.2 fL (ref 80.0–100.0)
Platelets: 179 10*3/uL (ref 150–440)
RBC: 3.06 MIL/uL — ABNORMAL LOW (ref 3.80–5.20)
RDW: 17.8 % — ABNORMAL HIGH (ref 11.5–14.5)
WBC: 4.9 10*3/uL (ref 3.6–11.0)

## 2017-02-16 LAB — BASIC METABOLIC PANEL
Anion gap: 8 (ref 5–15)
BUN: 29 mg/dL — ABNORMAL HIGH (ref 6–20)
CO2: 23 mmol/L (ref 22–32)
Calcium: 9 mg/dL (ref 8.9–10.3)
Chloride: 104 mmol/L (ref 101–111)
Creatinine, Ser: 2.06 mg/dL — ABNORMAL HIGH (ref 0.44–1.00)
GFR calc Af Amer: 28 mL/min — ABNORMAL LOW (ref >60)
GFR calc non Af Amer: 24 mL/min — ABNORMAL LOW (ref >60)
Glucose, Bld: 102 mg/dL — ABNORMAL HIGH (ref 65–99)
Potassium: 4.1 mmol/L (ref 3.5–5.1)
Sodium: 135 mmol/L (ref 135–145)

## 2017-02-16 NOTE — NC FL2 (Addendum)
[place] LEVEL OF CARE SCREENING TOOL     IDENTIFICATION  Patient Name: FANTASHA DANIELE Birthdate: 1951/04/05 Sex: female Admission Date (Current Location): 02/15/2017  [place] and [place] Number:  Engineering geologist and Address:  [facility], [address] [place], [place] [address]      Provider Number: [id number]  Attending Physician Name and Address:  Fritzi Mandes, MD  Relative Name and Phone Number:  Yashvi Jasinski (Spouse) [phone]    Current Level of Care: Hospital Recommended Level of Care: [place] Prior Approval Number:    Date Approved/Denied:   PASRR Number:    Discharge Plan: SNF    Current Diagnoses: Patient Active Problem List   Diagnosis Date Noted  . Sepsis ([place]) 02/06/2017  . Foot pain, bilateral 02/03/2017  . Tinea pedis 02/03/2017  . UTI (urinary tract infection) 01/02/2017  . Cellulitis and abscess of leg 12/29/2016  . Left ovarian cyst 12/09/2016  . Abdominal aortic atherosclerosis ([place]) 11/11/2016  . Cellulitis of left lower extremity 10/11/2016  . Swelling of lower extremity 10/11/2016  . Obstructive sleep apnea 10/11/2016  . Bilateral lower leg cellulitis 10/11/2016  . Follicular lymphoma of intra-abdominal lymph nodes ([place]) 08/06/2016  . Multiple falls 06/07/2016  . Inguinal adenopathy 05/29/2016  . Breast cancer screening 01/05/2016  . Morbid obesity ([place]) 01/05/2016  . Pressure ulcer 12/19/2015  . Low HDL (under 40) 12/19/2015  . Cellulitis 12/18/2015  . Skin ulcer ([place]) 11/08/2015  . Peripheral vascular disease of lower extremity with ulceration ([place]) 11/08/2015  . CKD (chronic kidney disease) stage 3, GFR 30-59 ml/min ([facility]) 11/08/2015  . Decubitus ulcer of left thigh, stage 2 04/18/2015  . Constipation due to pain medication 01/31/2015  . Obstructive sleep apnea of adult 01/13/2015  . Pelvic muscle wasting 01/13/2015  . Incomplete bladder emptying 01/13/2015  . Headache,  migraine 10/24/2014  . 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 Current tobacco use 10/24/2014  . Lumbar radiculopathy, chronic 10/02/2013  . COPD with bronchial hyperresponsiveness ([place]) 10/02/2013  . Major depressive disorder, recurrent, in partial remission ([place]) 10/02/2013  . Hypertension goal BP (blood pressure) < 140/90 10/02/2013  . Multiple sclerosis ([place]) 10/02/2013  . Absence of bladder continence 09/25/2012  . Acontractile bladder 02/12/2012  . Bladder retention 02/12/2012  . Narrowing of intervertebral disc space 11/26/2011  . Kyphoscoliosis and scoliosis 11/26/2011    Orientation RESPIRATION BLADDER Height & Weight     Self, Time, Situation, Place  Normal Incontinent Weight: (!) 300 lb 8 oz (136.3 kg) Height:  5\' 4"  (162.6 cm)  BEHAVIORAL SYMPTOMS/MOOD NEUROLOGICAL BOWEL NUTRITION STATUS      Continent Diet (Heart healthy)  AMBULATORY STATUS COMMUNICATION OF NEEDS Skin   Extensive Assist Verbally Other (Comment) (Wound on foot requiring IV ABX)                       Personal Care Assistance Level of Assistance  Bathing, Feeding, Dressing Bathing Assistance: Limited assistance Feeding assistance: Independent Dressing Assistance: Limited assistance     Functional Limitations Info             SPECIAL CARE FACTORS FREQUENCY  PT (By licensed PT)     PT Frequency: Up to 5X per day, 5 days per week              Contractures Contractures Info: Not present    Additional Factors Info  Code Status, Allergies, Psychotropic Code Status Info: Full Allergies Info: NKA Psychotropic  Info: Wellbutrin, Cymbalta, Gabapentin, Klonopin, Ritalin, Seroquel         Current Medications (02/16/2017):  This is the current hospital active medication list Current Facility-Administered Medications  Medication Dose Route Frequency Provider Last Rate Last Dose  . acetaminophen (TYLENOL) tablet 650 mg  650 mg Oral Q6H PRN Henreitta Leber, MD   650 mg at 02/16/17 1024   Or   . acetaminophen (TYLENOL) suppository 650 mg  650 mg Rectal Q6H PRN Henreitta Leber, MD      . buPROPion (WELLBUTRIN XL) 24 hr tablet 300 mg  300 mg Oral BH-q7a Sainani, Belia Heman, MD   300 mg at 02/16/17 1024  . clonazePAM (KLONOPIN) tablet 1 mg  1 mg Oral QHS Henreitta Leber, MD   1 mg at 02/15/17 2127  . darifenacin (ENABLEX) 24 hr tablet 7.5 mg  7.5 mg Oral Daily Henreitta Leber, MD   7.5 mg at 02/16/17 1025  . DULoxetine (CYMBALTA) DR capsule 60 mg  60 mg Oral Burnadette Pop, MD   60 mg at 02/16/17 1025  . fluticasone furoate-vilanterol (BREO ELLIPTA) 100-25 MCG/INH 1 puff  1 puff Inhalation Daily Henreitta Leber, MD   1 puff at 02/16/17 1023  . gabapentin (NEURONTIN) capsule 600 mg  600 mg Oral TID Henreitta Leber, MD   600 mg at 02/16/17 1024  . heparin injection 5,000 Units  5,000 Units Subcutaneous Q8H Henreitta Leber, MD   5,000 Units at 02/16/17 0603  . [START ON 02/17/2017] interferon beta-1a (AVONEX) injection 30 mcg  30 mcg Intramuscular Q Mon Sainani, Vivek J, MD      . lisinopril (PRINIVIL,ZESTRIL) tablet 5 mg  5 mg Oral Daily Sainani, Belia Heman, MD      . methylphenidate (RITALIN) tablet 10 mg  10 mg Oral BID Henreitta Leber, MD   10 mg at 02/16/17 0603  . multivitamin with minerals tablet 1 tablet  1 tablet Oral Daily Henreitta Leber, MD   1 tablet at 02/16/17 1024  . nystatin cream (MYCOSTATIN) 1 application  1 application Topical PRN Sainani, Belia Heman, MD      . ondansetron (ZOFRAN) tablet 4 mg  4 mg Oral Q6H PRN Henreitta Leber, MD       Or  . ondansetron (ZOFRAN) injection 4 mg  4 mg Intravenous Q6H PRN Sainani, Belia Heman, MD      . polyethylene glycol (MIRALAX / GLYCOLAX) packet 17 g  17 g Oral Daily PRN Henreitta Leber, MD      . QUEtiapine (SEROQUEL XR) 24 hr tablet 150 mg  150 mg Oral QHS Henreitta Leber, MD   150 mg at 02/15/17 2127  . senna-docusate (Senokot-S) tablet 1 tablet  1 tablet Oral Daily PRN Henreitta Leber, MD      . tiZANidine (ZANAFLEX)  tablet 4 mg  4 mg Oral QID Henreitta Leber, MD   4 mg at 02/16/17 1025  . topiramate (TOPAMAX) tablet 50 mg  50 mg Oral Daily Henreitta Leber, MD   50 mg at 02/16/17 1025  . vancomycin (VANCOCIN) 1,250 mg in sodium chloride 0.9 % 250 mL IVPB  1,250 mg Intravenous Q24H Pernell Dupre, RPH   Stopped at 02/15/17 2306  . vitamin C (ASCORBIC ACID) tablet 500 mg  500 mg Oral Daily Henreitta Leber, MD   500 mg at 02/16/17 1024   Facility-Administered Medications Ordered in Other Encounters  Medication Dose Route Frequency  Provider Last Rate Last Dose  . heparin lock flush 100 unit/mL  500 Units Intracatheter Once PRN Sindy Guadeloupe, MD         Discharge Medications: Please see discharge summary for a list of discharge medications.  Relevant Imaging Results:  Relevant Lab Results:   Additional Information SSN: [id number]  Zettie Pho, LCSW

## 2017-02-16 NOTE — Clinical Social Work Placement (Signed)
   CLINICAL SOCIAL WORK PLACEMENT  NOTE  Date:  02/16/2017  Patient Details  Name: [name] MRN: [id number] Date of Birth: Mar 16, 1951  Clinical Social Work is seeking post-discharge placement for this patient at the [place] level of care (*CSW will initial, date and re-position this form in  chart as items are completed):  Yes   Patient/family provided with [place] Work Department's list of facilities offering this level of care within the geographic area requested by the patient (or if unable, by the patient's family).  Yes   Patient/family informed of their freedom to choose among providers that offer the needed level of care, that participate in Medicare, Medicaid or managed care program needed by the patient, have an available bed and are willing to accept the patient.  Yes   Patient/family informed of [place]'s ownership interest in [facility] and [facility], as well as of the fact that they are under no obligation to receive care at these facilities.  PASRR submitted to EDS on 02/16/17     PASRR number received on       Existing PASRR number confirmed on       FL2 transmitted to all facilities in geographic area requested by pt/family on 02/16/17     FL2 transmitted to all facilities within larger geographic area on       Patient informed that his/her managed care company has contracts with or will negotiate with certain facilities, including the following:            Patient/family informed of bed offers received.  Patient chooses bed at       Physician recommends and patient chooses bed at      Patient to be transferred to   on  .  Patient to be transferred to facility by       Patient family notified on   of transfer.  Name of family member notified:        PHYSICIAN       Additional Comment:    _______________________________________________ Zettie Pho, LCSW 02/16/2017, 2:03 PM

## 2017-02-16 NOTE — Progress Notes (Addendum)
[facility] [place] NAME: [name]    MR#:  [id number]  DATE OF BIRTH:  [birth year]  SUBJECTIVE:  Came in after fall and hurt her right 5th digit  REVIEW OF SYSTEMS:   Review of Systems  Constitutional: Negative for chills, fever and weight loss.  HENT: Negative for ear discharge, ear pain and nosebleeds.   Eyes: Negative for blurred vision, pain and discharge.  Respiratory: Negative for sputum production, shortness of breath, wheezing and stridor.   Cardiovascular: Negative for chest pain, palpitations, orthopnea and PND.  Gastrointestinal: Negative for abdominal pain, diarrhea, nausea and vomiting.  Genitourinary: Negative for frequency and urgency.  Musculoskeletal: Positive for falls and joint pain. Negative for back pain.  Neurological: Positive for weakness. Negative for sensory change, speech change and focal weakness.  Psychiatric/Behavioral: Negative for depression and hallucinations. The patient is not nervous/anxious.    Tolerating Diet:yes Tolerating PT: SNF  DRUG ALLERGIES:  No Known Allergies  VITALS:  Blood pressure (!) 114/59, pulse 65, temperature 98.3 F (36.8 C), temperature source Oral, resp. rate 18, height 5\' 4"  (1.626 m), weight (!) 136.3 kg (300 lb 8 oz), SpO2 100 %.  PHYSICAL EXAMINATION:   Physical Exam  GENERAL:  66 y.o.-year-old patient lying in the bed with no acute distress. obese EYES: Pupils equal, round, reactive to light and accommodation. No scleral icterus. Extraocular muscles intact.  HEENT: Head atraumatic, normocephalic. Oropharynx and nasopharynx clear.  NECK:  Supple, no jugular venous distention. No thyroid enlargement, no tenderness.  LUNGS: Normal breath sounds bilaterally, no wheezing, rales, rhonchi. No use of accessory muscles of respiration.  CARDIOVASCULAR: S1, S2 normal. No murmurs, rubs, or gallops.  ABDOMEN: Soft, nontender, nondistended. Bowel sounds present. No  organomegaly or mass.  EXTREMITIES: No cyanosis, clubbing  ++ edema b/l.  bilateral LE dressing+, right boot + NEUROLOGIC: Cranial nerves II through XII are intact. No focal Motor or sensory deficits b/l.   PSYCHIATRIC:  patient is alert and oriented x 3.  SKIN: No obvious rash, lesion, or ulcer.   LABORATORY PANEL:  CBC  Recent Labs Lab 02/16/17 0323  WBC 4.9  HGB 9.0*  HCT 27.0*  PLT 179    Chemistries   Recent Labs Lab 02/16/17 0323  NA 135  K 4.1  CL 104  CO2 23  GLUCOSE 102*  BUN 29*  CREATININE 2.06*  CALCIUM 9.0   Cardiac Enzymes No results for input(s): TROPONINI in the last 168 hours. RADIOLOGY:  US Venous Img Lower Unilateral Left  Result Date: 02/15/2017 CLINICAL DATA:  Left leg pain, swelling, and erythema for 2 months. Follicular lymphoma. EXAM: LEFT LOWER EXTREMITY VENOUS DOPPLER ULTRASOUND TECHNIQUE: Gray-scale sonography with graded compression, as well as color Doppler and duplex ultrasound were performed to evaluate the lower extremity deep venous systems from the level of the common femoral vein and including the common femoral, femoral, profunda femoral, popliteal and calf veins including the posterior tibial, peroneal and gastrocnemius veins when visible. The superficial great saphenous vein was also interrogated. Spectral Doppler was utilized to evaluate flow at rest and with distal augmentation maneuvers in the common femoral, femoral and popliteal veins. COMPARISON:  02/07/2017 FINDINGS: Contralateral Common Femoral Vein: Respiratory phasicity is normal and symmetric with the symptomatic side. No evidence of thrombus. Normal compressibility. Common Femoral Vein: No evidence of thrombus. Normal compressibility, respiratory phasicity and response to augmentation. Saphenofemoral Junction: No evidence of thrombus. Normal compressibility and flow on color Doppler imaging. Profunda  Femoral Vein: No evidence of thrombus. Normal compressibility and flow on color  Doppler imaging. Femoral Vein: No evidence of thrombus. Normal compressibility, respiratory phasicity and response to augmentation. Popliteal Vein: No evidence of thrombus. Normal compressibility, respiratory phasicity and response to augmentation. Calf Veins: No evidence of thrombus. Normal compressibility and flow on color Doppler imaging. Superficial Great Saphenous Vein: No evidence of thrombus. Normal compressibility. Venous Reflux:  None. Other Findings: Soft tissue edema is seen within the left groin and lower extremity soft tissues. A focal fluid collection in the left groin and currently measures 2.8 x 2.4 x 2.7 cm, mildly decreased in size since previous study. IMPRESSION: No evidence of deep venous thrombosis. Left groin and lower extremity soft tissue edema. Mild decrease in size of left groin fluid collection since recent exam. Electronically Signed   By: Earle Gell M.D.   On: 02/15/2017 16:01   Dg Toe 5th Right  Result Date: 02/15/2017 CLINICAL DATA:  Acute right little toe pain following injury. Initial encounter. EXAM: RIGHT FIFTH TOE COMPARISON:  None. FINDINGS: A transverse fracture of the proximal aspect of the little toe proximal phalanx is noted with approximately 2 mm distraction. There is no evidence of subluxation or dislocation. IMPRESSION: Transverse fracture the proximal phalanx with 2 mm distraction. Electronically Signed   By: Margarette Canada M.D.   On: 02/15/2017 16:10   ASSESSMENT AND PLAN:  66 year old female with past medical history of multiple sclerosis,COPD, peripheral vascular disease, chronic lower extremity edema, obesity, hypertension, history of follicular lymphoma, anxiety/depression, ADHD who presents to the hospital due to worsening left lower extremity redness swelling and pain.  1. Left lower extremity cellulitis-patient has failed outpatient oral antibiotics with Keflex and Bactrim. - treat the patient with IV vancomycin - Patient is clinically afebrile with a  normal white cell count. - Dopplers were repeated in the ER and a negative for DVT still.  Right 5th proximal phalanx transverse fracture s/p injury at home--cont support boot, prn pain meds  2. Acute on chronic kidney injury-I suspect this is secondary to patient's Bactrim use. Creatinine today/. Pt received ivF  3.essential hypertension-continue lisinopril, hold hydrochlorothiazide.  4. Anxiety/depressioncontinue Klonopin, Cymbalta.  5. History of ADHD-continue Ritalin.  6. Multiple sclerosis-continue Avonex. - pt. Follows with Dr. Manuella Ghazi.   7. history of recurrent falls-patient was seen by physical therapy during her previous hospitalization and recommended short-term rehabilitation but the patient refused it. Patient is now interested in going to rehabilitation.   PT recommends rehab  Case discussed with Care Management/Social Worker. Management plans discussed with the patient, family and they are in agreement.  CODE STATUS: FULL  DVT Prophylaxis: heparin TOTAL TIME TAKING CARE OF THIS PATIENT: *30* minutes.  >50% time spent on counselling and coordination of care  POSSIBLE D/C IN *1-2* DAYS, DEPENDING ON CLINICAL CONDITION.  Note: This dictation was prepared with Dragon dictation along with smaller phrase technology. Any transcriptional errors that result from this process are unintentional.  Markasia Carrol M.D on 02/16/2017 at 4:38 PM  Between 7am to 6pm - Pager - [phone]  After 6pm go to [email or web address] - password [email or web address] [facility]  Office  [phone]  CC: Primary care physician; Arnetha Courser, MD

## 2017-02-16 NOTE — Evaluation (Signed)
Physical Therapy Evaluation Patient Details Name: [name] MRN: [id number] DOB: 03-04-1951 Today's Date: 02/16/2017   History of Present Illness  Pt is a 66 y/o F who presented with worsening LLE redness, swelling, pain. Pt admitted for LLE cellulities.  Negative for DVT BLE.   Patient was recently hospitalized for a left lower extremity cellulitis and discharged. Patient admits to significant weakness and having frequent falls over the past week. She was seen by physical therapy during her previous hospitalization and PT recommended short-term rehabilitation but patient did not want to go to rehabilitation and went home with home health services.  Pt's PMH includes ADHD, COPD, morbid obesity, MS, back surgery.    Clinical Impression  Pt admitted with above diagnosis. Pt currently with functional limitations due to the deficits listed below (see PT Problem List). Ms. Elling presents with BLE weaknes and pain but was agreeable to working with PT.  She currently requires mod assist for sit<>stand transfers and min assist to take several steps toward [facility] at bedside.  R partial knee buckle noted with sit>stand transfer and with side steps, thus deferred further ambulation due to safety concerns.  Pt fatigues quickly.  Given pt's current mobility status, recommending SNF at d/c.  Pt will benefit from skilled PT to increase their independence and safety with mobility to allow discharge to the venue listed below.      Follow Up Recommendations SNF    Equipment Recommendations  None recommended by PT    Recommendations for Other Services       Precautions / Restrictions Precautions Precautions: Fall Required Braces or Orthoses: Other Brace/Splint Other Brace/Splint: Pt found with R post op shoe which she reports she was provided with in the ED this admission and was told to [facility].  Restrictions Weight Bearing Restrictions: No      Mobility  Bed Mobility Overal bed mobility: Needs  Assistance Bed Mobility: Supine to Sit;Sit to Supine     Supine to sit: Min guard;HOB elevated Sit to supine: Min guard   General bed mobility comments: Max increased time and effort with cues to continue breathing as pt has tendency to hold her breath.  Heavy use of bed rail.    Transfers Overall transfer level: Needs assistance Equipment used: Rolling walker (2 wheeled) Transfers: Sit to/from Stand Sit to Stand: Mod assist         General transfer comment: Mod assist to boost to standing with cues for proper technique.  Pt demonstrates slight knee buckle on R upon initially standing.    Ambulation/Gait Ambulation/Gait assistance: Min assist Ambulation Distance (Feet): 3 Feet Assistive device: Rolling walker (2 wheeled) Gait Pattern/deviations: Shuffle;Antalgic;Wide base of support Gait velocity: decreased   General Gait Details: Wide base of support and instability, requiring min assist.  Pt takes several steps toward HOB at bedside with partial R knee buckle noted x1.  Deferred ambulating away from bed due to safety concerns.   Stairs            Wheelchair Mobility    Modified Rankin (Stroke Patients Only)       Balance Overall balance assessment: Needs assistance Sitting-balance support: No upper extremity supported;Feet supported Sitting balance-Leahy Scale: Good     Standing balance support: Bilateral upper extremity supported;During functional activity Standing balance-Leahy Scale: Poor Standing balance comment: Pt relies on UE support for static and dynamic activities  Pertinent Vitals/Pain Pain Assessment: 0-10 Pain Score: 4  Pain Location: BLEs Pain Descriptors / Indicators: Discomfort;Aching Pain Intervention(s): Limited activity within patient's tolerance;Monitored during session    [place] expects to be discharged to:: Private residence Living Arrangements: Spouse/significant  other Available Help at Discharge: Family;Friend(s) (husband, friend) Type of Home: House Home Access: Ramped entrance     Home Layout: One level Home Equipment: Environmental consultant - 4 wheels;Other (comment);Shower seat;Walker - 2 wheels (Risk analyst)      Prior Function Level of Independence: Needs assistance   Gait / Transfers Assistance Needed: Rollator for ambulation for very limited household ambulation. Very sedentary at baseline. Family reports at least 50 times that EMS has had to come get pt off floor due to falls and inability to stand. Pt reports 2-3 falls in the past few days.   ADL's / Homemaking Assistance Needed: Requires assist for all ADLs/IADLs  Comments: Pt sleeping in the lift chair.       Hand Dominance   Dominant Hand: Right    Extremity/Trunk Assessment   Upper Extremity Assessment Upper Extremity Assessment: Generalized weakness    Lower Extremity Assessment Lower Extremity Assessment: Generalized weakness (BLEs wrapped with redness noted)       Communication   Communication: No difficulties  Cognition Arousal/Alertness: Awake/alert Behavior During Therapy: WFL for tasks assessed/performed Overall Cognitive Status: Within Functional Limits for tasks assessed                                        General Comments      Exercises General Exercises - Lower Extremity Long Arc Quad: AROM;Both;10 reps;Seated   Assessment/Plan    PT Assessment Patient needs continued PT services  PT Problem List Decreased strength;Decreased activity tolerance;Decreased balance;Decreased mobility;Decreased knowledge of use of DME;Decreased safety awareness;Obesity;Pain       PT Treatment Interventions DME instruction;Gait training;Stair training;Functional mobility training;Therapeutic activities;Therapeutic exercise;Balance training;Neuromuscular re-education;Patient/family education;Wheelchair mobility training    PT Goals (Current goals can be found  in the Care Plan section)  Acute Rehab PT Goals Patient Stated Goal: rehab before home PT Goal Formulation: With patient Time For Goal Achievement: 03/02/17 Potential to Achieve Goals: Fair    Frequency Min 2X/week   Barriers to discharge Other (comment) Pt has had over 50 falls in the past 6 months while at home.      Co-evaluation               AM-PAC PT "6 Clicks" Daily Activity  Outcome Measure Difficulty turning over in bed (including adjusting bedclothes, sheets and blankets)?: Unable Difficulty moving from lying on back to sitting on the side of the bed? : Unable Difficulty sitting down on and standing up from a chair with arms (e.g., wheelchair, bedside commode, etc,.)?: Unable Help needed moving to and from a bed to chair (including a wheelchair)?: A Lot Help needed walking in hospital room?: A Lot Help needed climbing 3-5 steps with a railing? : Total 6 Click Score: 8    End of Session Equipment Utilized During Treatment: Gait belt Activity Tolerance: Patient limited by fatigue;Patient tolerated treatment well Patient left: in bed;with call bell/phone within reach;with bed alarm set Nurse Communication: Mobility status PT Visit Diagnosis: Pain;History of falling (Z91.81);Repeated falls (R29.6);Muscle weakness (generalized) (M62.81);Unsteadiness on feet (R26.81);Other abnormalities of gait and mobility (R26.89);Difficulty in walking, not elsewhere classified (R26.2) Pain - Right/Left: Left (L and R) Pain -  part of body: Leg    Time: 6859-9234 PT Time Calculation (min) (ACUTE ONLY): 14 min   Charges:   PT Evaluation $PT Eval Low Complexity: 1 Low     PT G Codes:   PT G-Codes **NOT FOR INPATIENT CLASS** Functional Assessment Tool Used: AM-PAC 6 Clicks Basic Mobility;Clinical judgement Functional Limitation: Mobility: Walking and moving around Mobility: Walking and Moving Around Current Status (Z4436): At least 80 percent but less than 100 percent impaired,  limited or restricted Mobility: Walking and Moving Around Goal Status [phone]): At least 40 percent but less than 60 percent impaired, limited or restricted    Collie Siad PT, DPT 02/16/2017, 1:35 PM

## 2017-02-16 NOTE — Clinical Social Work Note (Signed)
CSW received consult for SNF placement. PT is pending. CSW will assess when able.  Santiago Bumpers, MSW, Latanya Presser [phone]

## 2017-02-16 NOTE — Clinical Social Work Note (Signed)
Clinical Social Work Assessment  Patient Details  Name: [name] MRN: [id number] Date of Birth: 05/25/1950  Date of referral:  02/16/17               Reason for consult:  Facility Placement                Permission sought to share information with:  Chartered certified accountant granted to share information::  Yes, Verbal Permission Granted  Name::        Agency::     Relationship::     Contact Information:     Housing/Transportation Living arrangements for the past 2 months:  Single Family Home Source of Information:  Patient Patient Interpreter Needed:  None Criminal Activity/Legal Involvement Pertinent to Current Situation/Hospitalization:  No - Comment as needed Significant Relationships:  Spouse Lives with:  Spouse Do you feel safe going back to the place where you live?  Yes Need for family participation in patient care:  No (Coment)  Care giving concerns:  PT recommendation for STR   Social Worker assessment / plan:  CSW met with the patient at bedside to discuss discharge planning. The patient confirmed that at her last admission, PT had recommended STR, and she had declined at that time. The patient has decided to follow the recommendation of PT at this admission, and she gave verbal permission for referral. The patient indicated the [place] [facility] [place] are her preferences.   The patient has a terminal PASSAR. The CSW has updated the change in condition in [place] MUST, and the H+P and FL 2 have been sent to the appropriate designation to begin the process. The incoming CSW will send the signed 30 day note. The referral has been sent. The discharge date is unknown at this time. CSW will continue to follow to facilitate discharge.  Employment status:  Retired Nurse, adult PT Recommendations:  [place] / Referral to community resources:  [place]  Patient/Family's Response to care:   The patient thanked the CSW.  Patient/Family's Understanding of and Emotional Response to Diagnosis, Current Treatment, and Prognosis:  The patient understands the recommendation for STR and is in agreement.  Emotional Assessment Appearance:  Appears stated age Attitude/Demeanor/Rapport:  Lethargic Affect (typically observed):  Appropriate, Blunt Orientation:  Oriented to Self, Oriented to Place, Oriented to  Time, Oriented to Situation Alcohol / Substance use:  Never Used Psych involvement (Current and /or in the community):     Discharge Needs  Concerns to be addressed:  Care Coordination, Discharge Planning Concerns Readmission within the last 30 days:  Yes Current discharge risk:  Chronically ill, Psychiatric Illness Barriers to Discharge:  Continued Medical Work up, Programmer, applications ([place])   Zettie Pho, LCSW 02/16/2017, 2:04 PM

## 2017-02-17 ENCOUNTER — Ambulatory Visit: Payer: Medicare Other | Admitting: *Deleted

## 2017-02-17 ENCOUNTER — Telehealth: Payer: Self-pay

## 2017-02-17 DIAGNOSIS — R296 Repeated falls: Secondary | ICD-10-CM | POA: Diagnosis not present

## 2017-02-17 DIAGNOSIS — W19XXXA Unspecified fall, initial encounter: Secondary | ICD-10-CM | POA: Diagnosis not present

## 2017-02-17 DIAGNOSIS — I739 Peripheral vascular disease, unspecified: Secondary | ICD-10-CM | POA: Diagnosis not present

## 2017-02-17 DIAGNOSIS — Y92009 Unspecified place in unspecified non-institutional (private) residence as the place of occurrence of the external cause: Secondary | ICD-10-CM | POA: Diagnosis not present

## 2017-02-17 DIAGNOSIS — L03116 Cellulitis of left lower limb: Secondary | ICD-10-CM | POA: Diagnosis not present

## 2017-02-17 DIAGNOSIS — M6281 Muscle weakness (generalized): Secondary | ICD-10-CM | POA: Diagnosis not present

## 2017-02-17 DIAGNOSIS — R531 Weakness: Secondary | ICD-10-CM | POA: Diagnosis not present

## 2017-02-17 DIAGNOSIS — K5900 Constipation, unspecified: Secondary | ICD-10-CM | POA: Diagnosis not present

## 2017-02-17 DIAGNOSIS — G35 Multiple sclerosis: Secondary | ICD-10-CM | POA: Diagnosis not present

## 2017-02-17 DIAGNOSIS — L0390 Cellulitis, unspecified: Secondary | ICD-10-CM | POA: Diagnosis not present

## 2017-02-17 DIAGNOSIS — Z7401 Bed confinement status: Secondary | ICD-10-CM | POA: Diagnosis not present

## 2017-02-17 DIAGNOSIS — N139 Obstructive and reflux uropathy, unspecified: Secondary | ICD-10-CM | POA: Diagnosis not present

## 2017-02-17 DIAGNOSIS — Z23 Encounter for immunization: Secondary | ICD-10-CM | POA: Diagnosis not present

## 2017-02-17 DIAGNOSIS — Z7901 Long term (current) use of anticoagulants: Secondary | ICD-10-CM | POA: Diagnosis not present

## 2017-02-17 DIAGNOSIS — D649 Anemia, unspecified: Secondary | ICD-10-CM | POA: Diagnosis not present

## 2017-02-17 DIAGNOSIS — I10 Essential (primary) hypertension: Secondary | ICD-10-CM | POA: Diagnosis not present

## 2017-02-17 DIAGNOSIS — I2729 Other secondary pulmonary hypertension: Secondary | ICD-10-CM | POA: Diagnosis not present

## 2017-02-17 DIAGNOSIS — J449 Chronic obstructive pulmonary disease, unspecified: Secondary | ICD-10-CM | POA: Diagnosis not present

## 2017-02-17 DIAGNOSIS — S92911D Unspecified fracture of right toe(s), subsequent encounter for fracture with routine healing: Secondary | ICD-10-CM | POA: Diagnosis not present

## 2017-02-17 LAB — CREATININE, SERUM
Creatinine, Ser: 1.72 mg/dL — ABNORMAL HIGH (ref 0.44–1.00)
GFR calc Af Amer: 35 mL/min — ABNORMAL LOW (ref >60)
GFR calc non Af Amer: 30 mL/min — ABNORMAL LOW (ref >60)

## 2017-02-17 MED ORDER — CLINDAMYCIN HCL 300 MG PO CAPS: 300.0000 mg | ORAL_CAPSULE | Freq: Four times a day (QID) | ORAL | 0 refills | Status: DC

## 2017-02-17 MED ORDER — CLINDAMYCIN HCL 150 MG PO CAPS
600.0000 mg | ORAL_CAPSULE | Freq: Three times a day (TID) | ORAL | Status: DC
  Filled 2017-02-17 (×2): qty 2

## 2017-02-17 MED ORDER — CLINDAMYCIN HCL 150 MG PO CAPS
300.0000 mg | ORAL_CAPSULE | Freq: Four times a day (QID) | ORAL | Status: DC
  Administered 2017-02-17: 300 mg via ORAL
  Filled 2017-02-17 (×2): qty 2

## 2017-02-17 NOTE — Telephone Encounter (Signed)
Tilda Franco, OT back. No answer. LM for therapist informing her that per Dr.Lada she does not want to sign OT orders while pt is in rehab.

## 2017-02-17 NOTE — Discharge Instructions (Signed)
Wear hard boot for left foot daily

## 2017-02-17 NOTE — Telephone Encounter (Signed)
Please call the nursing station then at the hospital, because my hospital census says that she is in room 143 right now Thank you

## 2017-02-17 NOTE — Progress Notes (Addendum)
Patient is being discharged. Report called. Patient updated. IV removed with cath intact. Waiting for EMS at this time. Scripts in packet.  Husband took home clothing.

## 2017-02-17 NOTE — Consult Note (Signed)
[place] Nurse wound consult note Reason for Consult:NOnhealing wound/cellulitis to bilateral lower extremities.  Present on admission. Broke right fifth metatarsal and is in a rocker shoe and has nonintact lesion to plantar surface of fifth right toe.  Wound type: Chronic Pressure Injury POA: Yes Measurement:Left anterior lower leg:  2 cm x 1 cm x 0.2 cm ruddy red Right fifth toe, plantar surface:  0.2 cm slit in crease of toe..  Wound bed:red Drainage (amount, consistency, odor) scant serous weeping Periwound:Bilateral lower legs with chronic skin changes.  Erythema noted to left lower leg and has been treated for cellulitis.  Patient states she does not wear compression.  Legs are not edematous today.  No need for compression Dressing procedure/placement/frequency:Cleanse wounds to left anterior lower leg and right fifth metatarsal with NS and pat dry.  Apply Mepitel silicone layer to promote moist healing.  Cover with dry dressing and kerlix/tape.  Change Monday/Wednesday and Friday. Will not follow at this time.  Please re-consult if needed.  [name] RN BSN [place] Pager [phone]

## 2017-02-17 NOTE — Progress Notes (Signed)
Clinical Social Worker (CSW) presented bed offers to patient and she chose [name]. Patient is medically stable for D/C to [name] today. Per [name] liaison patient can come today to room 408-P. RN will call report to RN Inda Merlin at [phone] and arrange EMS for transport. CSW sent D/C orders to [name] via [facility]. Patient is aware of above. Patient's husband [name] brought patient's Avonex medication to [facility] so it can go to [name] with patient. Please reconsult if future social work needs arise. CSW signing off.   [employer], LCSW [phone]

## 2017-02-17 NOTE — Discharge Summary (Signed)
[place] [facility] [place] NAME: [name]    MR#:  [id number]  DATE OF BIRTH:  [birth year]  DATE OF ADMISSION:  02/15/2017 ADMITTING PHYSICIAN: Henreitta Leber, MD  DATE OF DISCHARGE: 02/17/17  PRIMARY CARE PHYSICIAN: Arnetha Courser, MD    ADMISSION DIAGNOSIS:  Cellulitis of left leg [L03.116] Cellulitis of right leg [W09.811] Laceration of fifth toe of right foot, initial encounter [S91.114A] Injury of right toe, initial encounter [B14.782N]  DISCHARGE DIAGNOSIS:  Right leg cellulitis Left fifth toe proximal phalax transverse fracture and small laceration--conservative mnx with boot/foot wear   SECONDARY DIAGNOSIS:   Past Medical History:  Diagnosis Date  . Abdominal aortic atherosclerosis ([place]) 11/11/2016  . ADHD   . Anxiety   . COPD (chronic obstructive pulmonary disease) ([place])   . Depression    major depressive  . Dyspnea    doe  . Edema    left leg  . Follicular lymphoma ([place])    B Cell  . Follicular lymphoma grade II ([place])   . Hypertension   . Hypotension    idiopathic  . Kyphoscoliosis and scoliosis 11/26/2011  . Morbid obesity ([place]) 01/05/2016  . Multiple sclerosis ([place])   . Multiple sclerosis ([place])    1980's  . Neuromuscular disorder ([place])   . Obstructive and reflux uropathy    foley  . Pain    atypical facial  . Peripheral vascular disease of lower extremity with ulceration ([place]) 11/08/2015  . Skin ulcer ([place]) 11/08/2015  . Weakness    generalized. has MS    HOSPITAL COURSE:   66 year old female with past medical history of multiple sclerosis,COPD, peripheral vascular disease, chronic lower extremity edema, obesity, hypertension, history of follicular lymphoma, anxiety/depression, ADHD who presents to the hospital due to worsening left lower extremity redness swelling and pain.  1.Left lower extremity cellulitis-patient has failed outpatient oral antibiotics with Keflex and Bactrim. - treat the  patient with IV vancomycin---appears a lot better--changed to oral clinda - Patient is clinically afebrile with a normal white cell count. - Dopplers were repeated in the ER and a negative for DVT still.  Right 5th proximal phalanx transverse fracture s/p injury at home--cont support boot, prn pain meds  2. Acute on chronic kidney injury-I suspect this is secondary to patient's Bactrim use. Creatinine improved down to 1.72 - Pt received ivF  3.essential hypertension-continue lisinopril, resumed hydrochlorothiazide.  4. Anxiety/depressioncontinue Klonopin, Cymbalta.  5. History of ADHD-continue Ritalin.  6. Multiple sclerosis-continue Avonex. - pt. Follows with Dr. Manuella Ghazi.   7. history of recurrent falls-patient was seen by physical therapy during her previous hospitalization and recommended short-term rehabilitation but the patient refused it. Patient is now interested in going to rehabilitation.   PT recommends rehab  Per CSW bed available. D/c to rehab CONSULTS OBTAINED:    DRUG ALLERGIES:  No Known Allergies  DISCHARGE MEDICATIONS:   Current Discharge Medication List    START taking these medications   Details  clindamycin (CLEOCIN) 300 MG capsule Take 1 capsule (300 mg total) by mouth every 6 (six) hours. Qty: 28 capsule, Refills: 0      CONTINUE these medications which have NOT CHANGED   Details  BIOTIN PO Take by mouth daily. Pt taking 500 mcg daily    buPROPion (WELLBUTRIN XL) 300 MG 24 hr tablet Take 300 mg by mouth every morning.    Associated Diagnoses: Malignant lymphoma, undifferentiated cell, non-Burkitt's (HCC)    clonazePAM (KLONOPIN) 1 MG  tablet Take 1 tablet (1 mg total) by mouth at bedtime. Qty: 5 tablet, Refills: 0    DULoxetine (CYMBALTA) 60 MG capsule Take 1 capsule (60 mg total) by mouth every morning. Qty: 30 capsule, Refills: 3    fluticasone furoate-vilanterol (BREO ELLIPTA) 100-25 MCG/INH AEPB Inhale 1 puff into the lungs  daily. Qty: 1 each, Refills: 11    gabapentin (NEURONTIN) 600 MG tablet Take 1 tablet (600 mg total) by mouth 3 (three) times daily.    hydrochlorothiazide (MICROZIDE) 12.5 MG capsule Take 12.5 mg by mouth daily. Refills: 5    interferon beta-1a (AVONEX) 30 MCG/0.5ML PSKT injection Inject 30 mcg into the muscle every Monday.    lisinopril (PRINIVIL,ZESTRIL) 5 MG tablet Take 1 tablet (5 mg total) by mouth daily. Qty: 30 tablet, Refills: 1    methylphenidate (RITALIN) 10 MG tablet Take 1 tablet (10 mg total) by mouth 2 (two) times daily. Qty: 30 tablet, Refills: 0    Multiple Vitamin (MULTIVITAMIN WITH MINERALS) TABS tablet Take 1 tablet by mouth daily.    QUEtiapine Fumarate (SEROQUEL XR) 150 MG 24 hr tablet Take 150 mg by mouth at bedtime.  Refills: 4   Associated Diagnoses: Follicular lymphoma of intra-abdominal lymph nodes, unspecified follicular lymphoma type ([place]); Visit for monitoring Rituxan therapy    solifenacin (VESICARE) 10 MG tablet Take 10 mg by mouth daily.     tiZANidine (ZANAFLEX) 4 MG tablet Take 1 tablet (4 mg total) by mouth 4 (four) times daily. TAKES FOR MS Qty: 30 tablet, Refills: 0    topiramate (TOPAMAX) 50 MG tablet Take 1 tablet (50 mg total) by mouth daily. Qty: 30 tablet, Refills: 0    vitamin C (ASCORBIC ACID) 500 MG tablet Take 500 mg by mouth daily.    Diapers & Supplies (HUGGIES PULL-UPS) MISC 1 each by Does not apply route 2 (two) times daily. Dx urinary incontinence, MS; LON 99 months Qty: 60 each, Refills: 11    lidocaine-prilocaine (EMLA) cream Apply 1 application topically as needed. apply cream over the port site and then place small piece of saran wrap over the cream to protect your clothing Qty: 30 g, Refills: 1    nystatin cream (MYCOSTATIN) Apply topically daily. Qty: 30 g, Refills: 0    polyethylene glycol powder (GLYCOLAX/MIRALAX) powder Take 17 g by mouth daily as needed for mild constipation. Mixed in water   Associated Diagnoses:  Constipation due to pain medication    senna-docusate (SENOKOT-S) 8.6-50 MG tablet Take 1 tablet by mouth daily as needed for mild constipation.       STOP taking these medications     cephALEXin (KEFLEX) 500 MG capsule      sulfamethoxazole-trimethoprim (BACTRIM DS,SEPTRA DS) 800-160 MG tablet         If you experience worsening of your admission symptoms, develop shortness of breath, life threatening emergency, suicidal or homicidal thoughts you must seek medical attention immediately by calling 911 or calling your MD immediately  if symptoms less severe.  You Must read complete instructions/literature along with all the possible adverse reactions/side effects for all the Medicines you take and that have been prescribed to you. Take any new Medicines after you have completely understood and accept all the possible adverse reactions/side effects.   Please note  You were cared for by a hospitalist during your hospital stay. If you have any questions about your discharge medications or the care you received while you were in the hospital after you are discharged, you can  call the unit and asked to speak with the hospitalist on call if the hospitalist that took care of you is not available. Once you are discharged, your primary care physician will handle any further medical issues. Please note that NO REFILLS for any discharge medications will be authorized once you are discharged, as it is imperative that you return to your primary care physician (or establish a relationship with a primary care physician if you do not have one) for your aftercare needs so that they can reassess your need for medications and monitor your lab values.  DATA REVIEW:   CBC   Recent Labs Lab 02/16/17 0323  WBC 4.9  HGB 9.0*  HCT 27.0*  PLT 179    Chemistries   Recent Labs Lab 02/16/17 0323 02/17/17 1040  NA 135  --   K 4.1  --   CL 104  --   CO2 23  --   GLUCOSE 102*  --   BUN 29*  --    CREATININE 2.06* 1.72*  CALCIUM 9.0  --     Microbiology Results   Recent Results (from the past 240 hour(s))  Culture, blood (routine x 2)     Status: None (Preliminary result)   Collection Time: 02/15/17  2:23 PM  Result Value Ref Range Status   Specimen Description BLOOD LEFT ARM  Final   Special Requests   Final    BOTTLES DRAWN AEROBIC AND ANAEROBIC Blood Culture adequate volume   Culture NO GROWTH 2 DAYS  Final   Report Status PENDING  Incomplete  Culture, blood (routine x 2)     Status: None (Preliminary result)   Collection Time: 02/15/17  2:26 PM  Result Value Ref Range Status   Specimen Description BLOOD RIGHT ARM  Final   Special Requests   Final    BOTTLES DRAWN AEROBIC AND ANAEROBIC Blood Culture adequate volume   Culture NO GROWTH 2 DAYS  Final   Report Status PENDING  Incomplete    RADIOLOGY:  US Venous Img Lower Unilateral Left  Result Date: 02/15/2017 CLINICAL DATA:  Left leg pain, swelling, and erythema for 2 months. Follicular lymphoma. EXAM: LEFT LOWER EXTREMITY VENOUS DOPPLER ULTRASOUND TECHNIQUE: Gray-scale sonography with graded compression, as well as color Doppler and duplex ultrasound were performed to evaluate the lower extremity deep venous systems from the level of the common femoral vein and including the common femoral, femoral, profunda femoral, popliteal and calf veins including the posterior tibial, peroneal and gastrocnemius veins when visible. The superficial great saphenous vein was also interrogated. Spectral Doppler was utilized to evaluate flow at rest and with distal augmentation maneuvers in the common femoral, femoral and popliteal veins. COMPARISON:  02/07/2017 FINDINGS: Contralateral Common Femoral Vein: Respiratory phasicity is normal and symmetric with the symptomatic side. No evidence of thrombus. Normal compressibility. Common Femoral Vein: No evidence of thrombus. Normal compressibility, respiratory phasicity and response to  augmentation. Saphenofemoral Junction: No evidence of thrombus. Normal compressibility and flow on color Doppler imaging. Profunda Femoral Vein: No evidence of thrombus. Normal compressibility and flow on color Doppler imaging. Femoral Vein: No evidence of thrombus. Normal compressibility, respiratory phasicity and response to augmentation. Popliteal Vein: No evidence of thrombus. Normal compressibility, respiratory phasicity and response to augmentation. Calf Veins: No evidence of thrombus. Normal compressibility and flow on color Doppler imaging. Superficial Great Saphenous Vein: No evidence of thrombus. Normal compressibility. Venous Reflux:  None. Other Findings: Soft tissue edema is seen within the left groin and lower extremity  soft tissues. A focal fluid collection in the left groin and currently measures 2.8 x 2.4 x 2.7 cm, mildly decreased in size since previous study. IMPRESSION: No evidence of deep venous thrombosis. Left groin and lower extremity soft tissue edema. Mild decrease in size of left groin fluid collection since recent exam. Electronically Signed   By: Earle Gell M.D.   On: 02/15/2017 16:01   Dg Toe 5th Right  Result Date: 02/15/2017 CLINICAL DATA:  Acute right little toe pain following injury. Initial encounter. EXAM: RIGHT FIFTH TOE COMPARISON:  None. FINDINGS: A transverse fracture of the proximal aspect of the little toe proximal phalanx is noted with approximately 2 mm distraction. There is no evidence of subluxation or dislocation. IMPRESSION: Transverse fracture the proximal phalanx with 2 mm distraction. Electronically Signed   By: Margarette Canada M.D.   On: 02/15/2017 16:10     Management plans discussed with the patient, family and they are in agreement.  CODE STATUS:     Code Status Orders        Start     Ordered   02/15/17 1715  Full code  Continuous     02/15/17 1715    Code Status History    Date Active Date Inactive Code Status Order ID Comments User Context    02/06/2017 11:56 PM 02/09/2017  6:58 PM Full Code [id number]  Dustin Flock, MD Inpatient   01/02/2017  7:03 PM 01/06/2017  6:48 PM Full Code [id number]  [name], MD ED   12/29/2016 12:52 PM 12/30/2016  5:22 PM Full Code [id number]  Nicholes Mango, MD Inpatient   10/11/2016 11:20 PM 10/14/2016  5:26 PM Full Code [id number]  Theodoro Grist, MD Inpatient   08/28/2016 11:59 AM 08/30/2016  7:32 PM Full Code [id number]  Loletha Grayer, MD ED   12/18/2015  7:56 PM 12/19/2015  7:39 PM Full Code [id number]  Hower, Aaron Mose, MD ED      TOTAL TIME TAKING CARE OF THIS PATIENT: *40* minutes.    Arno Cullers M.D on 02/17/2017 at 11:53 AM  Between 7am to 6pm - Pager - [phone] After 6pm go to [email or web address] - password [email or web address] [facility]  Office  [phone]  CC: Primary care physician; Arnetha Courser, MD

## 2017-02-17 NOTE — Telephone Encounter (Signed)
Received call from [facility] occupational therapist who states that she needs orders on this pt for PT. Therapist is requesting therapy once a week for 1 week, followed by therapy twice a week for 4 weeks. Caryl Pina can be reached at [phone] to give verbal orders, or I am happy to call on your behalf. Thanks!

## 2017-02-17 NOTE — Telephone Encounter (Signed)
I think the patient is currently hospitalized, so we'll put those orders on hold and see what her needs are when she is discharged Thank you

## 2017-02-17 NOTE — Progress Notes (Signed)
[place] at [place] NAME: [name]    MR#:  [id number]  DATE OF BIRTH:  09/09/1950  SUBJECTIVE:  Came in after fall and hurt her right 5th digit Feels better. Left leg redness and edema improved, no fever REVIEW OF SYSTEMS:   Review of Systems  Constitutional: Negative for chills, fever and weight loss.  HENT: Negative for ear discharge, ear pain and nosebleeds.   Eyes: Negative for blurred vision, pain and discharge.  Respiratory: Negative for sputum production, shortness of breath, wheezing and stridor.   Cardiovascular: Negative for chest pain, palpitations, orthopnea and PND.  Gastrointestinal: Negative for abdominal pain, diarrhea, nausea and vomiting.  Genitourinary: Negative for frequency and urgency.  Musculoskeletal: Positive for falls and joint pain. Negative for back pain.  Neurological: Positive for weakness. Negative for sensory change, speech change and focal weakness.  Psychiatric/Behavioral: Negative for depression and hallucinations. The patient is not nervous/anxious.    Tolerating Diet:yes Tolerating PT: SNF  DRUG ALLERGIES:  No Known Allergies  VITALS:  Blood pressure (!) 115/44, pulse 73, temperature 98.5 F (36.9 C), temperature source Oral, resp. rate 20, height 5\' 4"  (1.626 m), weight (!) 136.3 kg (300 lb 8 oz), SpO2 92 %.  PHYSICAL EXAMINATION:   Physical Exam  GENERAL:  66 y.o.-year-old patient lying in the bed with no acute distress. obese EYES: Pupils equal, round, reactive to light and accommodation. No scleral icterus. Extraocular muscles intact.  HEENT: Head atraumatic, normocephalic. Oropharynx and nasopharynx clear.  NECK:  Supple, no jugular venous distention. No thyroid enlargement, no tenderness.  LUNGS: Normal breath sounds bilaterally, no wheezing, rales, rhonchi. No use of accessory muscles of respiration.  CARDIOVASCULAR: S1, S2 normal. No murmurs, rubs, or gallops.  ABDOMEN: Soft,  nontender, nondistended. Bowel sounds present. No organomegaly or mass.  EXTREMITIES: No cyanosis, clubbing  ++ edema b/l.  bilateral LE dressing+, right boot + NEUROLOGIC: Cranial nerves II through XII are intact. No focal Motor or sensory deficits b/l.   PSYCHIATRIC:  patient is alert and oriented x 3.  SKIN: No obvious rash, lesion, or ulcer.   LABORATORY PANEL:  CBC  Recent Labs Lab 02/16/17 0323  WBC 4.9  HGB 9.0*  HCT 27.0*  PLT 179    Chemistries   Recent Labs Lab 02/16/17 0323  NA 135  K 4.1  CL 104  CO2 23  GLUCOSE 102*  BUN 29*  CREATININE 2.06*  CALCIUM 9.0   Cardiac Enzymes No results for input(s): TROPONINI in the last 168 hours. RADIOLOGY:  US Venous Img Lower Unilateral Left  Result Date: 02/15/2017 CLINICAL DATA:  Left leg pain, swelling, and erythema for 2 months. Follicular lymphoma. EXAM: LEFT LOWER EXTREMITY VENOUS DOPPLER ULTRASOUND TECHNIQUE: Gray-scale sonography with graded compression, as well as color Doppler and duplex ultrasound were performed to evaluate the lower extremity deep venous systems from the level of the common femoral vein and including the common femoral, femoral, profunda femoral, popliteal and calf veins including the posterior tibial, peroneal and gastrocnemius veins when visible. The superficial great saphenous vein was also interrogated. Spectral Doppler was utilized to evaluate flow at rest and with distal augmentation maneuvers in the common femoral, femoral and popliteal veins. COMPARISON:  02/07/2017 FINDINGS: Contralateral Common Femoral Vein: Respiratory phasicity is normal and symmetric with the symptomatic side. No evidence of thrombus. Normal compressibility. Common Femoral Vein: No evidence of thrombus. Normal compressibility, respiratory phasicity and response to augmentation. Saphenofemoral Junction: No evidence of thrombus.  Normal compressibility and flow on color Doppler imaging. Profunda Femoral Vein: No evidence of  thrombus. Normal compressibility and flow on color Doppler imaging. Femoral Vein: No evidence of thrombus. Normal compressibility, respiratory phasicity and response to augmentation. Popliteal Vein: No evidence of thrombus. Normal compressibility, respiratory phasicity and response to augmentation. Calf Veins: No evidence of thrombus. Normal compressibility and flow on color Doppler imaging. Superficial Great Saphenous Vein: No evidence of thrombus. Normal compressibility. Venous Reflux:  None. Other Findings: Soft tissue edema is seen within the left groin and lower extremity soft tissues. A focal fluid collection in the left groin and currently measures 2.8 x 2.4 x 2.7 cm, mildly decreased in size since previous study. IMPRESSION: No evidence of deep venous thrombosis. Left groin and lower extremity soft tissue edema. Mild decrease in size of left groin fluid collection since recent exam. Electronically Signed   By: Earle Gell M.D.   On: 02/15/2017 16:01   Dg Toe 5th Right  Result Date: 02/15/2017 CLINICAL DATA:  Acute right little toe pain following injury. Initial encounter. EXAM: RIGHT FIFTH TOE COMPARISON:  None. FINDINGS: A transverse fracture of the proximal aspect of the little toe proximal phalanx is noted with approximately 2 mm distraction. There is no evidence of subluxation or dislocation. IMPRESSION: Transverse fracture the proximal phalanx with 2 mm distraction. Electronically Signed   By: Margarette Canada M.D.   On: 02/15/2017 16:10   ASSESSMENT AND PLAN:  66 year old female with past medical history of multiple sclerosis,COPD, peripheral vascular disease, chronic lower extremity edema, obesity, hypertension, history of follicular lymphoma, anxiety/depression, ADHD who presents to the hospital due to worsening left lower extremity redness swelling and pain.  1. Left lower extremity cellulitis-patient has failed outpatient oral antibiotics with Keflex and Bactrim. - treat the patient with IV  vancomycin---appears a lot better--change to oral clinda - Patient is clinically afebrile with a normal white cell count. - Dopplers were repeated in the ER and a negative for DVT still.  Right 5th proximal phalanx transverse fracture s/p injury at home--cont support boot, prn pain meds  2. Acute on chronic kidney injury-I suspect this is secondary to patient's Bactrim use. Creatinine today/. Pt received ivF  3.essential hypertension-continue lisinopril, hold hydrochlorothiazide.  4. Anxiety/depressioncontinue Klonopin, Cymbalta.  5. History of ADHD-continue Ritalin.  6. Multiple sclerosis-continue Avonex. - pt. Follows with Dr. Manuella Ghazi.   7. history of recurrent falls-patient was seen by physical therapy during her previous hospitalization and recommended short-term rehabilitation but the patient refused it. Patient is now interested in going to rehabilitation.   PT recommends rehab today if bed available  Case discussed with Care Management/Social Worker. Management plans discussed with the patient, family and they are in agreement.  CODE STATUS: FULL  DVT Prophylaxis: heparin TOTAL TIME TAKING CARE OF THIS PATIENT: *30* minutes.  >50% time spent on counselling and coordination of care  POSSIBLE D/C IN *1-2* DAYS, DEPENDING ON CLINICAL CONDITION.  Note: This dictation was prepared with Dragon dictation along with smaller phrase technology. Any transcriptional errors that result from this process are unintentional.  Mylisa Brunson M.D on 02/17/2017 at 8:18 AM  Between 7am to 6pm - Pager - [phone]  After 6pm go to [email or web address] - password [email or web address] [facility]  Office  [phone]  CC: Primary care physician; Arnetha Courser, MD

## 2017-02-17 NOTE — Clinical Social Work Placement (Signed)
   CLINICAL SOCIAL WORK PLACEMENT  NOTE  Date:  02/17/2017  Patient Details  Name: [name] MRN: [id number] Date of Birth: 09/05/1950  Clinical Social Work is seeking post-discharge placement for this patient at the [place] level of care (*CSW will initial, date and re-position this form in  chart as items are completed):  Yes   Patient/family provided with [facility] Work Department's list of facilities offering this level of care within the geographic area requested by the patient (or if unable, by the patient's family).  Yes   Patient/family informed of their freedom to choose among providers that offer the needed level of care, that participate in Medicare, Medicaid or managed care program needed by the patient, have an available bed and are willing to accept the patient.  Yes   Patient/family informed of [facility] ownership interest in [facility] and [facility], as well as of the fact that they are under no obligation to receive care at these facilities.  PASRR submitted to EDS on 02/16/17     PASRR number received on 02/17/17     Existing PASRR number confirmed on       FL2 transmitted to all facilities in geographic area requested by pt/family on 02/16/17     FL2 transmitted to all facilities within larger geographic area on       Patient informed that his/her managed care company has contracts with or will negotiate with certain facilities, including the following:        Yes   Patient/family informed of bed offers received.  Patient chooses bed at  ([facility] )     Physician recommends and patient chooses bed at      Patient to be transferred to  ([facility] ) on 02/17/17.  Patient to be transferred to facility by  [facility] )     Patient family notified on 02/17/17 of transfer.  Name of family member notified:   (Patient's husband [name] is at bedside and aware of D/C today. )     PHYSICIAN        Additional Comment:    _______________________________________________ Weslee Prestage, [place] Beets, LCSW 02/17/2017, 1:35 PM

## 2017-02-17 NOTE — Telephone Encounter (Signed)
[name], OT back who states that the pt is currently home and is not hospitalized. Please Advise.

## 2017-02-18 ENCOUNTER — Encounter: Payer: Self-pay | Admitting: Urology

## 2017-02-18 ENCOUNTER — Ambulatory Visit: Payer: Medicare Other | Admitting: Podiatry

## 2017-02-18 ENCOUNTER — Ambulatory Visit: Payer: Medicare Other | Admitting: Urology

## 2017-02-19 ENCOUNTER — Ambulatory Visit: Payer: Self-pay | Admitting: *Deleted

## 2017-02-19 ENCOUNTER — Ambulatory Visit: Payer: Medicare Other | Attending: Radiation Oncology | Admitting: Radiation Oncology

## 2017-02-19 DIAGNOSIS — M6281 Muscle weakness (generalized): Secondary | ICD-10-CM | POA: Diagnosis not present

## 2017-02-19 DIAGNOSIS — R296 Repeated falls: Secondary | ICD-10-CM | POA: Diagnosis not present

## 2017-02-19 DIAGNOSIS — L03116 Cellulitis of left lower limb: Secondary | ICD-10-CM | POA: Diagnosis not present

## 2017-02-19 DIAGNOSIS — G35 Multiple sclerosis: Secondary | ICD-10-CM | POA: Diagnosis not present

## 2017-02-19 DIAGNOSIS — Z7901 Long term (current) use of anticoagulants: Secondary | ICD-10-CM | POA: Diagnosis not present

## 2017-02-20 ENCOUNTER — Inpatient Hospital Stay: Payer: Medicare Other | Admitting: Family Medicine

## 2017-02-20 LAB — CULTURE, BLOOD (ROUTINE X 2)
Culture: NO GROWTH
Culture: NO GROWTH
Special Requests: ADEQUATE
Special Requests: ADEQUATE

## 2017-02-26 ENCOUNTER — Other Ambulatory Visit: Payer: Self-pay | Admitting: *Deleted

## 2017-02-26 DIAGNOSIS — J449 Chronic obstructive pulmonary disease, unspecified: Secondary | ICD-10-CM | POA: Diagnosis not present

## 2017-02-26 DIAGNOSIS — S92911D Unspecified fracture of right toe(s), subsequent encounter for fracture with routine healing: Secondary | ICD-10-CM | POA: Diagnosis not present

## 2017-02-26 DIAGNOSIS — L03116 Cellulitis of left lower limb: Secondary | ICD-10-CM | POA: Diagnosis not present

## 2017-02-26 DIAGNOSIS — G35 Multiple sclerosis: Secondary | ICD-10-CM | POA: Diagnosis not present

## 2017-02-26 NOTE — Patient Outreach (Signed)
Late entry- view in Epic pt discharged to SNF [facility]) October 22,2018 after recent hospitalization October 20-22,2018 for Left lower extremity cellulitis.   Collaborated with Chrystal [facility] LCSW today October 31,2018  to follow pt while at [facility].     Plan:  RN CM to send referral for [facility] LCSW to follow pt while at [facility].            RN CM to follow up with pt at discharge from SNF.    [name].   [facility] Care Management  [phone]

## 2017-02-27 ENCOUNTER — Ambulatory Visit: Payer: Self-pay | Admitting: *Deleted

## 2017-02-28 ENCOUNTER — Other Ambulatory Visit: Payer: Self-pay | Admitting: *Deleted

## 2017-02-28 NOTE — Patient Outreach (Signed)
[facility]) Care Management  02/28/2017  [name] 1950/11/18 [id number]   [facility] Social work referral received on 02/26/17  by [facility] to follow patient while at the SNF.     Plan: This social worker will plan to follow up with patient at [facility] on 03/03/17.    [name] [facility] Care Management [phone]

## 2017-03-03 ENCOUNTER — Other Ambulatory Visit: Payer: Self-pay | Admitting: *Deleted

## 2017-03-03 NOTE — Patient Outreach (Signed)
[place] [facility]) Care Management  [facility] Social Work  03/03/2017  [name] 1950-10-05 [id number]  Subjective:  Patient is a 66 year old female referred to this social worker to follow patient while in rehab at [facility]. Patient was discharged on 02/17/17 after a recent inpatient stay for cellulitis, left lower extremity. Patient states that she continues to reside with her estranged spouse who has made accommodations to their home widening the bathroom and closet for her use Patient also has a friend [name], that lives with her and helps to provide care, per patient when asked. Patient discussed wanting to return home as soon as possible.  Per patient, she does not have a discharge date yet, the discharge planner Janett Billow, was not available when this social worker visited the facility   Objective:   Encounter Medications:  Outpatient Encounter Medications as of 03/03/2017  Medication Sig Note  . BIOTIN PO Take by mouth daily. Pt taking 500 mcg daily   . buPROPion (WELLBUTRIN XL) 300 MG 24 hr tablet Take 300 mg by mouth every morning.    . cephALEXin (KEFLEX) 500 MG capsule TK ONE C PO  Q 12 H   . clindamycin (CLEOCIN) 300 MG capsule Take 1 capsule (300 mg total) by mouth every 6 (six) hours.   . clonazePAM (KLONOPIN) 1 MG tablet Take 1 tablet (1 mg total) by mouth at bedtime.   . Diapers & Supplies (HUGGIES PULL-UPS) MISC 1 each by Does not apply route 2 (two) times daily. Dx urinary incontinence, MS; LON 99 months   . DULoxetine (CYMBALTA) 60 MG capsule Take 1 capsule (60 mg total) by mouth every morning.   . fluticasone furoate-vilanterol (BREO ELLIPTA) 100-25 MCG/INH AEPB Inhale 1 puff into the lungs daily.   Marland Kitchen gabapentin (NEURONTIN) 600 MG tablet Take 1 tablet (600 mg total) by mouth 3 (three) times daily.   . hydrochlorothiazide (MICROZIDE) 12.5 MG capsule Take 12.5 mg by mouth daily.   . interferon beta-1a (AVONEX) 30 MCG/0.5ML PSKT injection Inject 30 mcg into the  muscle every Monday.   . lidocaine-prilocaine (EMLA) cream Apply 1 application topically as needed. apply cream over the port site and then place small piece of saran wrap over the cream to protect your clothing 02/04/2017: Available as needed.   Marland Kitchen lisinopril (PRINIVIL,ZESTRIL) 5 MG tablet Take 1 tablet (5 mg total) by mouth daily.   . methylphenidate (RITALIN) 10 MG tablet Take 1 tablet (10 mg total) by mouth 2 (two) times daily.   . Multiple Vitamin (MULTIVITAMIN WITH MINERALS) TABS tablet Take 1 tablet by mouth daily.   Marland Kitchen nystatin cream (MYCOSTATIN) Apply topically daily.   . polyethylene glycol powder (GLYCOLAX/MIRALAX) powder Take 17 g by mouth daily as needed for mild constipation. Mixed in water 02/04/2017: As needed.   . potassium chloride (K-DUR) 10 MEQ tablet TAKE ONE (1) TABLET EACH DAY   . predniSONE (DELTASONE) 10 MG tablet 60 mg on day 1, 50 mg on day 2, 40 mg on day 3, 30 mg on day 4, 20 mg on day 5, 10 mg on day 6 then stop   . QUEtiapine Fumarate (SEROQUEL XR) 150 MG 24 hr tablet Take 150 mg by mouth at bedtime.    . senna-docusate (SENOKOT-S) 8.6-50 MG tablet Take 1 tablet by mouth daily as needed for mild constipation.  02/04/2017: As needed.   . solifenacin (VESICARE) 10 MG tablet Take 10 mg by mouth daily.    Marland Kitchen tiZANidine (ZANAFLEX) 4 MG  tablet Take 1 tablet (4 mg total) by mouth 4 (four) times daily. TAKES FOR MS   . topiramate (TOPAMAX) 50 MG tablet Take 1 tablet (50 mg total) by mouth daily.   Marland Kitchen triamterene-hydrochlorothiazide (DYAZIDE) 37.5-25 MG capsule Take by mouth.   . vitamin C (ASCORBIC ACID) 500 MG tablet Take 500 mg by mouth daily.    Facility-Administered Encounter Medications as of 03/03/2017  Medication  . heparin lock flush 100 unit/mL    Functional Status:  In your present state of health, do you have any difficulty performing the following activities: 03/03/2017 02/15/2017  Hearing? N N  Vision? Y N  Difficulty concentrating or making decisions? N N   Walking or climbing stairs? Y Y  Dressing or bathing? Y N  Doing errands, shopping? Y N  Preparing Food and eating ? Y -  Using the Toilet? N -  In the past six months, have you accidently leaked urine? N -  Comment - -  Do you have problems with loss of bowel control? N -  Managing your Medications? N -  Managing your Finances? N -  Housekeeping or managing your Housekeeping? Y -  Some recent data might be hidden    Fall/Depression Screening:  PHQ 2/9 Scores 03/03/2017 02/04/2017 02/03/2017 11/11/2016 10/09/2016 08/02/2016 06/18/2016  PHQ - 2 Score 2 2 4 4  0 1 0  PHQ- 9 Score 5 6 10  - - - -    Assessment: Patient friendly but very "matter of fact" in her discussion regarding her care and quality of support in her home. Patient spoke slowly, with several pauses between sentences. Per patient, her spouse helps with her care and friend [name] when asked. Patient reports wanting to return home as soon as possible but is unsure of discharge date at this time.  Plan:  [facility] CM Care Plan Problem One     Most Recent Value  Care Plan Problem One  SNF admission resulting from hospitalization for cellulitis  Role Documenting the Problem One  Clinical Social Worker  Care Plan for Problem One  Active  [facility] Long Term Goal   Patient will not readmit to the hospital/SNF over the next 31 days  [facility]N Long Term Goal Start Date  03/03/17  Interventions for Problem One Long Term Goal  This social work reiterated importance of activie paericipation in rehab   [facility] CM Short Term Goal #1   Within the next 30 days patient will discharge home from rehab  [facility] CM Short Term Goal #1 Start Date  03/03/17  Interventions for Short Term Goal #1  This social worker explained the benefits of active participation in PT and OT while in rehab to ensure a successfuk discharge home  [facility] CM Short Term Goal #2   Over the next 30 days, patient will discuss discharge plan/needs with discharge planner  [facility] CM Short Term Goal #2 Start  Date  03/03/17  Interventions for Short Term Goal #2  This social encouraged patient to reveiw discharge plans with the discharge planner at the facility to ensure a safe and stable discharge     [name] [facility] Care Management [phone]

## 2017-03-04 ENCOUNTER — Encounter: Payer: Self-pay | Admitting: *Deleted

## 2017-03-10 ENCOUNTER — Other Ambulatory Visit: Payer: Self-pay | Admitting: *Deleted

## 2017-03-10 NOTE — Patient Outreach (Signed)
[place] [facility]) Care Management  03/10/2017  [name] 1951-03-25 [id number]   Notified by Donnalee Curry, SW at facility that patient discharge home on 03/09/17 per patient choice. Home care ordered-[facility].  Plan to notify [facility] care team of discharge and sign off. [name]. [name], RN, BSN, [place] [phone]) Business Cell  [phone]) Toll Free Office

## 2017-03-11 ENCOUNTER — Other Ambulatory Visit: Payer: Self-pay | Admitting: *Deleted

## 2017-03-11 NOTE — Patient Outreach (Signed)
Successful telephone encounter to [name], 66 year old female- follow up on recent discharge from [facility].   This RN CM informed by Kathrynn Running  RN CCM post acute coordinator on 03/10/17 per SW at [facility] Pt  discharged home 03/09/17, Home health ordered- [facility].  Pt  Admitted to rehab after recent hospitalization October 20-22,2018 for cellulitis of both legs, laceration of fifth toe of right foot. [place] in [place], Pt has had multiple hospitalizations (5 in 6 months).  Pt's history includes but not limited to AAA, ADHD, COPD, Depression, Multiple sclerosis, PVD.  Spoke with pt, HIPAA identifiers verified,discussed purpose of call - follow up on recent SNF discharge.  Pt reports doing good,ambulating regularly, doing leg exercises,no cellulitis in leg.  Pt reports has not heard from [facility] yet, has number to call if needed.  Pt reports appetite is good, can't eat much, has enough,does not overeat.  Pt reports spouse and friend [name] help her with her care.  Pt reports takes all of her medications per discharge instructions, to  follow up with Dr. Sanda Klein 03/17/17.    RN CM reviewed with pt [facility]N transition of care program (weekly calls, a home visit)  as this RN CM followed pt prior to hospitalization for transition of care.   Plan:  As discussed with pt, RN CM coworkers covering for this RN CM the  next 2 weeks to follow up telephonically once a week.            RN CM to follow up with pt upon return to work, schedule home visit.   Zara Chess.   [place] Care Management  [phone]

## 2017-03-13 ENCOUNTER — Other Ambulatory Visit: Payer: Self-pay | Admitting: *Deleted

## 2017-03-13 ENCOUNTER — Encounter: Payer: Self-pay | Admitting: *Deleted

## 2017-03-13 ENCOUNTER — Telehealth: Payer: Self-pay | Admitting: Family Medicine

## 2017-03-13 DIAGNOSIS — J449 Chronic obstructive pulmonary disease, unspecified: Secondary | ICD-10-CM | POA: Diagnosis not present

## 2017-03-13 DIAGNOSIS — N183 Chronic kidney disease, stage 3 (moderate): Secondary | ICD-10-CM | POA: Diagnosis not present

## 2017-03-13 DIAGNOSIS — M5416 Radiculopathy, lumbar region: Secondary | ICD-10-CM | POA: Diagnosis not present

## 2017-03-13 DIAGNOSIS — M419 Scoliosis, unspecified: Secondary | ICD-10-CM | POA: Diagnosis not present

## 2017-03-13 DIAGNOSIS — I129 Hypertensive chronic kidney disease with stage 1 through stage 4 chronic kidney disease, or unspecified chronic kidney disease: Secondary | ICD-10-CM | POA: Diagnosis not present

## 2017-03-13 DIAGNOSIS — C8293 Follicular lymphoma, unspecified, intra-abdominal lymph nodes: Secondary | ICD-10-CM | POA: Diagnosis not present

## 2017-03-13 DIAGNOSIS — G35 Multiple sclerosis: Secondary | ICD-10-CM | POA: Diagnosis not present

## 2017-03-13 DIAGNOSIS — I739 Peripheral vascular disease, unspecified: Secondary | ICD-10-CM | POA: Diagnosis not present

## 2017-03-13 DIAGNOSIS — G629 Polyneuropathy, unspecified: Secondary | ICD-10-CM | POA: Diagnosis not present

## 2017-03-13 DIAGNOSIS — M1990 Unspecified osteoarthritis, unspecified site: Secondary | ICD-10-CM | POA: Diagnosis not present

## 2017-03-13 NOTE — Telephone Encounter (Signed)
Copied from [place] [phone]. Topic: Quick Communication - See Telephone Encounter >> Mar 13, 2017  4:28 PM Bea Graff, NT wrote: CRM for notification. See Telephone encounter for: Lorriane Shire from  [place] is needing verbal orders to continue nursing for 2 weeks. CB#: [phone]  03/13/17.

## 2017-03-13 NOTE — Patient Outreach (Addendum)
[place] [facility]) Care Management  03/13/2017  YURIKA PEREDA Dec 07, 1950 [id number]   Phone call to patient to assess for community resource needs post discharge form the rehab. Per patient, she is doing well since she has discharged from [facility]. She conitnues to have the care giving  support of her husband and friend [name] who is residing with them for now. Per patient, either her husband or friend [name] with transport her to her follow up appointment with Dr. Sanda Klein  on 03/17/17. Per patient, [facility] nurse came out to visit this morning, PT should start soon.  Patient verbalized having no community resource needs at this time. Patient to be closed to social work. RNCM and patient 's provider to be notified of case closure.    [name] [facility] Care Management [phone]

## 2017-03-14 DIAGNOSIS — C8293 Follicular lymphoma, unspecified, intra-abdominal lymph nodes: Secondary | ICD-10-CM | POA: Diagnosis not present

## 2017-03-14 DIAGNOSIS — M1990 Unspecified osteoarthritis, unspecified site: Secondary | ICD-10-CM | POA: Diagnosis not present

## 2017-03-14 DIAGNOSIS — I129 Hypertensive chronic kidney disease with stage 1 through stage 4 chronic kidney disease, or unspecified chronic kidney disease: Secondary | ICD-10-CM | POA: Diagnosis not present

## 2017-03-14 DIAGNOSIS — N183 Chronic kidney disease, stage 3 (moderate): Secondary | ICD-10-CM | POA: Diagnosis not present

## 2017-03-14 DIAGNOSIS — I739 Peripheral vascular disease, unspecified: Secondary | ICD-10-CM | POA: Diagnosis not present

## 2017-03-14 DIAGNOSIS — G35 Multiple sclerosis: Secondary | ICD-10-CM | POA: Diagnosis not present

## 2017-03-14 DIAGNOSIS — M419 Scoliosis, unspecified: Secondary | ICD-10-CM | POA: Diagnosis not present

## 2017-03-14 DIAGNOSIS — M5416 Radiculopathy, lumbar region: Secondary | ICD-10-CM | POA: Diagnosis not present

## 2017-03-14 DIAGNOSIS — Z87440 Personal history of urinary (tract) infections: Secondary | ICD-10-CM | POA: Diagnosis not present

## 2017-03-14 DIAGNOSIS — Z466 Encounter for fitting and adjustment of urinary device: Secondary | ICD-10-CM | POA: Diagnosis not present

## 2017-03-14 DIAGNOSIS — G629 Polyneuropathy, unspecified: Secondary | ICD-10-CM | POA: Diagnosis not present

## 2017-03-14 DIAGNOSIS — J449 Chronic obstructive pulmonary disease, unspecified: Secondary | ICD-10-CM | POA: Diagnosis not present

## 2017-03-14 NOTE — Telephone Encounter (Signed)
Left detailed voicemail

## 2017-03-14 NOTE — Telephone Encounter (Signed)
Okay for orders? 

## 2017-03-17 ENCOUNTER — Ambulatory Visit: Payer: Medicare Other | Admitting: Family Medicine

## 2017-03-17 DIAGNOSIS — G35 Multiple sclerosis: Secondary | ICD-10-CM | POA: Diagnosis not present

## 2017-03-17 DIAGNOSIS — C8293 Follicular lymphoma, unspecified, intra-abdominal lymph nodes: Secondary | ICD-10-CM | POA: Diagnosis not present

## 2017-03-17 DIAGNOSIS — G629 Polyneuropathy, unspecified: Secondary | ICD-10-CM | POA: Diagnosis not present

## 2017-03-17 DIAGNOSIS — I739 Peripheral vascular disease, unspecified: Secondary | ICD-10-CM | POA: Diagnosis not present

## 2017-03-17 DIAGNOSIS — N183 Chronic kidney disease, stage 3 (moderate): Secondary | ICD-10-CM | POA: Diagnosis not present

## 2017-03-17 DIAGNOSIS — J449 Chronic obstructive pulmonary disease, unspecified: Secondary | ICD-10-CM | POA: Diagnosis not present

## 2017-03-17 DIAGNOSIS — M419 Scoliosis, unspecified: Secondary | ICD-10-CM | POA: Diagnosis not present

## 2017-03-17 DIAGNOSIS — M5416 Radiculopathy, lumbar region: Secondary | ICD-10-CM | POA: Diagnosis not present

## 2017-03-17 DIAGNOSIS — I129 Hypertensive chronic kidney disease with stage 1 through stage 4 chronic kidney disease, or unspecified chronic kidney disease: Secondary | ICD-10-CM | POA: Diagnosis not present

## 2017-03-17 DIAGNOSIS — M1990 Unspecified osteoarthritis, unspecified site: Secondary | ICD-10-CM | POA: Diagnosis not present

## 2017-03-18 ENCOUNTER — Telehealth: Payer: Self-pay

## 2017-03-18 DIAGNOSIS — C8293 Follicular lymphoma, unspecified, intra-abdominal lymph nodes: Secondary | ICD-10-CM | POA: Diagnosis not present

## 2017-03-18 DIAGNOSIS — N183 Chronic kidney disease, stage 3 (moderate): Secondary | ICD-10-CM | POA: Diagnosis not present

## 2017-03-18 DIAGNOSIS — G35 Multiple sclerosis: Secondary | ICD-10-CM | POA: Diagnosis not present

## 2017-03-18 DIAGNOSIS — J449 Chronic obstructive pulmonary disease, unspecified: Secondary | ICD-10-CM | POA: Diagnosis not present

## 2017-03-18 DIAGNOSIS — I129 Hypertensive chronic kidney disease with stage 1 through stage 4 chronic kidney disease, or unspecified chronic kidney disease: Secondary | ICD-10-CM | POA: Diagnosis not present

## 2017-03-18 DIAGNOSIS — M1990 Unspecified osteoarthritis, unspecified site: Secondary | ICD-10-CM | POA: Diagnosis not present

## 2017-03-18 DIAGNOSIS — M419 Scoliosis, unspecified: Secondary | ICD-10-CM | POA: Diagnosis not present

## 2017-03-18 DIAGNOSIS — G629 Polyneuropathy, unspecified: Secondary | ICD-10-CM | POA: Diagnosis not present

## 2017-03-18 DIAGNOSIS — M5416 Radiculopathy, lumbar region: Secondary | ICD-10-CM | POA: Diagnosis not present

## 2017-03-18 DIAGNOSIS — I739 Peripheral vascular disease, unspecified: Secondary | ICD-10-CM | POA: Diagnosis not present

## 2017-03-18 NOTE — Telephone Encounter (Signed)
Copied from [place] [id number]. Topic: Inquiry >> Mar 18, 2017 12:10 PM Oliver Pila B wrote: Reason for CRM: Colletta Maryland from [place] called for verbal orders for PT they are needing occupational therapy for 1 time for 1 week and then 2x's for 4 weeks, contact stephanie @ [phone]

## 2017-03-18 NOTE — Telephone Encounter (Signed)
Patient no showed for her appt yesterday Needs an appt Okay for orders, but patient needs to understand that she needs to be seen

## 2017-03-19 ENCOUNTER — Other Ambulatory Visit: Payer: Self-pay | Admitting: *Deleted

## 2017-03-19 DIAGNOSIS — J449 Chronic obstructive pulmonary disease, unspecified: Secondary | ICD-10-CM | POA: Diagnosis not present

## 2017-03-19 DIAGNOSIS — I129 Hypertensive chronic kidney disease with stage 1 through stage 4 chronic kidney disease, or unspecified chronic kidney disease: Secondary | ICD-10-CM | POA: Diagnosis not present

## 2017-03-19 DIAGNOSIS — G35 Multiple sclerosis: Secondary | ICD-10-CM | POA: Diagnosis not present

## 2017-03-19 DIAGNOSIS — G629 Polyneuropathy, unspecified: Secondary | ICD-10-CM | POA: Diagnosis not present

## 2017-03-19 DIAGNOSIS — M1990 Unspecified osteoarthritis, unspecified site: Secondary | ICD-10-CM | POA: Diagnosis not present

## 2017-03-19 DIAGNOSIS — M5416 Radiculopathy, lumbar region: Secondary | ICD-10-CM | POA: Diagnosis not present

## 2017-03-19 DIAGNOSIS — N183 Chronic kidney disease, stage 3 (moderate): Secondary | ICD-10-CM | POA: Diagnosis not present

## 2017-03-19 DIAGNOSIS — I739 Peripheral vascular disease, unspecified: Secondary | ICD-10-CM | POA: Diagnosis not present

## 2017-03-19 DIAGNOSIS — M419 Scoliosis, unspecified: Secondary | ICD-10-CM | POA: Diagnosis not present

## 2017-03-19 DIAGNOSIS — C8293 Follicular lymphoma, unspecified, intra-abdominal lymph nodes: Secondary | ICD-10-CM | POA: Diagnosis not present

## 2017-03-19 NOTE — Telephone Encounter (Signed)
Colletta Maryland from [facility] notified

## 2017-03-19 NOTE — Patient Outreach (Signed)
[place] [facility]) Care Management  03/19/2017  BLAKELEIGH DOMEK 1950/12/29 [id number]   Transition of care call   66 year old female recent rehab stay after hospital admission for cellulitis of both legs, laceration right fifth toe, PMHx includes COPD Depression ,PVC.  Successful outreach call to patient ,she reports she is doing good and legs look good, reinforced elevating legs throughout the day.  Patient discussed  [facility] RN and physical therapy has visited her and she has been working on exercises.  Patient reports her appetite is good and tolerating diet. Patient discussed she is using her rolling walker, reports having a fall on this morning when answering front door, she was using her walker states her knees buckled and she just let herself down to the floor, she denies injury, no dizziness ,  she states she is perfected how to fall and not get hurt. Reviewed fall prevention measures .  Patient discussed she has family available to assist with transportation , reinforced rescheduling post hospital visit with PCP, agrees she will do.  Patient denies any new concerns at this time  Plan Patient will continue to receive weekly outreaches , next call in a week, explained to patient that care manager covering for her assigned RNCM will be contacting her on next week ,patient verbalized understanding .   Joylene Draft, RN, [place] Management Coordinator  [phone]- Mobile [phone]- Toll Free Main Office

## 2017-03-24 ENCOUNTER — Telehealth: Payer: Self-pay | Admitting: Family Medicine

## 2017-03-24 ENCOUNTER — Telehealth: Payer: Self-pay | Admitting: *Deleted

## 2017-03-24 ENCOUNTER — Ambulatory Visit: Payer: Medicare Other | Admitting: Podiatry

## 2017-03-24 DIAGNOSIS — J449 Chronic obstructive pulmonary disease, unspecified: Secondary | ICD-10-CM | POA: Diagnosis not present

## 2017-03-24 DIAGNOSIS — G35 Multiple sclerosis: Secondary | ICD-10-CM | POA: Diagnosis not present

## 2017-03-24 DIAGNOSIS — M1990 Unspecified osteoarthritis, unspecified site: Secondary | ICD-10-CM | POA: Diagnosis not present

## 2017-03-24 DIAGNOSIS — G629 Polyneuropathy, unspecified: Secondary | ICD-10-CM | POA: Diagnosis not present

## 2017-03-24 DIAGNOSIS — N183 Chronic kidney disease, stage 3 (moderate): Secondary | ICD-10-CM | POA: Diagnosis not present

## 2017-03-24 DIAGNOSIS — C8293 Follicular lymphoma, unspecified, intra-abdominal lymph nodes: Secondary | ICD-10-CM | POA: Diagnosis not present

## 2017-03-24 DIAGNOSIS — I739 Peripheral vascular disease, unspecified: Secondary | ICD-10-CM | POA: Diagnosis not present

## 2017-03-24 DIAGNOSIS — M419 Scoliosis, unspecified: Secondary | ICD-10-CM | POA: Diagnosis not present

## 2017-03-24 DIAGNOSIS — I129 Hypertensive chronic kidney disease with stage 1 through stage 4 chronic kidney disease, or unspecified chronic kidney disease: Secondary | ICD-10-CM | POA: Diagnosis not present

## 2017-03-24 DIAGNOSIS — M5416 Radiculopathy, lumbar region: Secondary | ICD-10-CM | POA: Diagnosis not present

## 2017-03-24 NOTE — Telephone Encounter (Signed)
Copied from [place] [id number]. Topic: Quick Communication - See Telephone Encounter >> Mar 24, 2017  3:45 PM Boyd Kerbs wrote: CRM for notification. See Telephone encounter for:  [name] from [facility] [phone] needs verbal order for [facility] boot for her left leg.    03/24/17. >> Mar 24, 2017  4:03 PM Yvette Rack wrote: [name] From [facility] wanted [place] to cancell last message because she would like for the patient to get a topical antibiotic with dressing due to pain and redness on lower left leg call Lana for a verbal order at [phone]

## 2017-03-24 NOTE — Telephone Encounter (Signed)
Copied from [place] [phone]. Topic: Quick Communication - See Telephone Encounter >> Mar 24, 2017  3:45 PM Boyd Kerbs wrote: CRM for notification. See Telephone encounter for:  [name] from [facility] [phone] needs verbal order for [facility] boot for her left leg.    03/24/17. >> Mar 24, 2017  4:03 PM Yvette Rack wrote: [name] From [facility] wanted [place] to cancell last message because she would like for the patient to get a topical antibiotic with dressing due to pain and redness on lower left leg call Lana for a verbal order at [phone]

## 2017-03-24 NOTE — Telephone Encounter (Signed)
That's fine for the topical bactoban BID x 5 days, one tube with zero refills Topical dressing okay per RN discretion If she is not getting better, refer to wound care Please review med list with home health nurse, as it looks like she on two antibiotics and there are some duplicates Thank you

## 2017-03-24 NOTE — Telephone Encounter (Signed)
Copied from [place] [id number]. Topic: Quick Communication - See Telephone Encounter >> Mar 24, 2017  3:45 PM Boyd Kerbs wrote: CRM for notification. See Telephone encounter for:  [name] from [facility] [phone] needs verbal order for [facility] boot for her left leg.    03/24/17.

## 2017-03-25 ENCOUNTER — Other Ambulatory Visit: Payer: Self-pay | Admitting: *Deleted

## 2017-03-25 ENCOUNTER — Encounter: Payer: Self-pay | Admitting: *Deleted

## 2017-03-25 MED ORDER — MUPIROCIN 2 % EX OINT: 1.0000 "application " | TOPICAL_OINTMENT | Freq: Two times a day (BID) | CUTANEOUS | 0 refills | Status: DC

## 2017-03-25 NOTE — Telephone Encounter (Signed)
Lana nurse from [facility] notified and given verbal order. She states you will have to send this rx to her pharmacy. Will call back tomorrow when with patient to go over medication list.

## 2017-03-25 NOTE — Telephone Encounter (Signed)
Please see need for orders below. I still do not see where pt has scheduled an appt w/ you.

## 2017-03-25 NOTE — Telephone Encounter (Signed)
Patient notified, states she is not on any antibiotics at this time.

## 2017-03-25 NOTE — Patient Outreach (Signed)
[place] [facility]) Care Management [place] Telephone Outreach, Transition of Care day 15  03/25/2017  [name] [birth year] [id number]  Successful telephone outreach to [name], 66 y/o female referred to [place] for transition of care after recent hospitalization October 20-22, 2018 for bilateral LE cellulitis; patient was discharged from hospital to SNF and was subsequently discharged home from SNF with home health [facility]) services through [facility] on March 09, 2017.  Patient has history including, but not limited to MS, HTN, PVD, COPD with OSA, and CKD stage III.  HIPAA/ identity with patient during phone call today.  I explained that I was contacting patient for primary [facility] RN CM [name])  Today, patient reports that she "is doing okay," but states that she is currently visiting with her "insurance man" who is trying to assist her "with getting a better health insurance plan;" patient states she "will not be able to talk to me for very long" due to this visit. Patient eventually asked that I speak with her friend "[name]" who is also present in the home today.  Patient denies pain and new falls today, however reports that she was visited by [facility] RN yesterday, who was concerned that her (L) leg had increased swelling.  Patient reports that [facility] RN wrapped her (L) leg with unaboot wrapping, and facilitated placing an order for antibiotic cream to apply on her legs; patient's friend [name] reports they are waiting for antibiotic cream to be filled by patient's pharmacy.  Patient/ friend [name] further reports:  -- has all medications and is taking as prescribed; denies concerns or questions around patient's medications today  -- "will try" to attend scheduled office visit at cancer center tomorrow, patient states, "it all depends on how I feel tomorrow."  Patient was encouraged to try to attend visit, and we discussed need for patient to maintain close contact  with her medical providers, especially when/ if she is not feeling well.  Patient again stated she would try to attend the appointment, and stated that her friend [name] would transport her if she decides to attend appointment.  -- patient has continued elevating her legs "as much as possible."  Reports recent increased swelling "came on gradually," and has been monitored by [facility] staff/ RN, as well as by her friend and herself.  Patient denies further issues, concerns, or problems today, and states that she needs to hang up due to visit in progress at her home with insurance agent.  I provided patient with my direct phone number, and confirmed that she has the main [place] office phone number, and the [facility] CM 24-hour nurse advice phone number should issues arise prior to next scheduled [place] outreach.  I explained that [name]) would contact patient again next week for ongoing transition of care and reminded patient of Rose's scheduled [facility] RN CCM home visit scheduled for Friday April 04, 2017; patient verbalized understanding and agreement with this plan.  Plan:  Will update patient's primary [facility] RN CM of today's successful telephone outreach to patient for ongoing transition of care  [facility] CM Care Plan Problem One     Most Recent Value  Care Plan Problem One  Risk for readmission related to recent SNF visit and hospitalization.   Role Documenting the Problem One  Care Management [place] for Problem One  Active  [facility] Long Term Goal   Pt will not readmit to SNF or hospital in the next  31 days   [facility]N Long Term Goal Start Date  03/11/17  Interventions for Problem One Long Term Goal  Discussed with patient current clinical status/ leg swelling, medications, and home health services in place,  encouraged patient to promptly notify PCP/ care providers for any new concerns  [facility] CM Short Term Goal #1   Pt would follow up with all MD appointments in the next 30 days   THN CM Short Term  Goal #1 Start Date  03/11/17  Interventions for Short Term Goal #1  Reviewed upcoming provider appointments with patient and encouraged patient to attend scheduled appointment tomorrow at cancer center  [facility] CM Short Term Goal #2   Pt would take all medications as ordred for the next 30 days   [facility] CM Short Term Goal #2 Start Date  03/11/17  Interventions for Short Term Goal #2  Verified with patient and caregiver/ friend that patient has all medications and is taking as prescribed,  verified that neither patient nor caregiver have concerns around patient's current medications.  Discussed recent home health RN visit with patient/ friend- caregiver, with outcome of antibiotic cream ordered by [facility] RN     Oneta Rack, RN, BSN, [place] Coordinator [facility] Care Management  [phone]

## 2017-03-25 NOTE — Telephone Encounter (Signed)
I sent Rx for topical

## 2017-03-25 NOTE — Telephone Encounter (Signed)
I think there was another phone note about canceling the Unna boot and doing topical antibiotic See the other note Let home health know that I need to see this patient or I won't be able to sign off on her home health orders and they may not get paid (that's my understanding, is that patient needs to see physician within 30 days)

## 2017-03-25 NOTE — Addendum Note (Signed)
Addended by: LADA, Satira Anis on: 03/25/2017 04:50 PM   Modules accepted: Orders

## 2017-03-25 NOTE — Telephone Encounter (Signed)
[name] called for the pt to find out where the bactoban was sent. Not at her pharmacy. Nurse is coming tomorrow and they asap.  [facility] [place] [place] [phone] (Phone) [phone] (Fax)

## 2017-03-25 NOTE — Telephone Encounter (Signed)
Please review med list first Thank you!

## 2017-03-26 ENCOUNTER — Ambulatory Visit: Payer: Medicare Other | Admitting: Urology

## 2017-03-26 ENCOUNTER — Telehealth: Payer: Self-pay | Admitting: Family Medicine

## 2017-03-26 ENCOUNTER — Other Ambulatory Visit: Payer: Self-pay | Admitting: *Deleted

## 2017-03-26 ENCOUNTER — Ambulatory Visit: Payer: Medicare Other | Admitting: Radiation Oncology

## 2017-03-26 DIAGNOSIS — M419 Scoliosis, unspecified: Secondary | ICD-10-CM | POA: Diagnosis not present

## 2017-03-26 DIAGNOSIS — I739 Peripheral vascular disease, unspecified: Secondary | ICD-10-CM | POA: Diagnosis not present

## 2017-03-26 DIAGNOSIS — M1990 Unspecified osteoarthritis, unspecified site: Secondary | ICD-10-CM | POA: Diagnosis not present

## 2017-03-26 DIAGNOSIS — C8293 Follicular lymphoma, unspecified, intra-abdominal lymph nodes: Secondary | ICD-10-CM | POA: Diagnosis not present

## 2017-03-26 DIAGNOSIS — J449 Chronic obstructive pulmonary disease, unspecified: Secondary | ICD-10-CM | POA: Diagnosis not present

## 2017-03-26 DIAGNOSIS — I129 Hypertensive chronic kidney disease with stage 1 through stage 4 chronic kidney disease, or unspecified chronic kidney disease: Secondary | ICD-10-CM | POA: Diagnosis not present

## 2017-03-26 DIAGNOSIS — M5416 Radiculopathy, lumbar region: Secondary | ICD-10-CM | POA: Diagnosis not present

## 2017-03-26 DIAGNOSIS — G629 Polyneuropathy, unspecified: Secondary | ICD-10-CM | POA: Diagnosis not present

## 2017-03-26 DIAGNOSIS — G35 Multiple sclerosis: Secondary | ICD-10-CM | POA: Diagnosis not present

## 2017-03-26 DIAGNOSIS — N183 Chronic kidney disease, stage 3 (moderate): Secondary | ICD-10-CM | POA: Diagnosis not present

## 2017-03-26 NOTE — Telephone Encounter (Signed)
Copied from [place] [phone]. Topic: Quick Communication - See Telephone Encounter >> Mar 26, 2017  4:31 PM Corie Chiquito, [place] wrote: CRM for notification. See Telephone encounter for: [name] from [facility] called because she wanted to know if they could have a verbal order to use the [facility] boot with the Mupirocin Ointment on the left foot/leg together.If someone could give her a call back about this matter at [phone], and also to continue skill nursing with patient.If no answer please leave msg  03/26/17.

## 2017-03-26 NOTE — Telephone Encounter (Signed)
[employer], she states that they do not need an order for an una boot at this time. They are using topical ointment.

## 2017-03-27 DIAGNOSIS — G35 Multiple sclerosis: Secondary | ICD-10-CM | POA: Diagnosis not present

## 2017-03-27 DIAGNOSIS — M5416 Radiculopathy, lumbar region: Secondary | ICD-10-CM | POA: Diagnosis not present

## 2017-03-27 DIAGNOSIS — I700 Atherosclerosis of aorta: Secondary | ICD-10-CM | POA: Diagnosis not present

## 2017-03-27 DIAGNOSIS — I2729 Other secondary pulmonary hypertension: Secondary | ICD-10-CM | POA: Diagnosis not present

## 2017-03-27 DIAGNOSIS — I739 Peripheral vascular disease, unspecified: Secondary | ICD-10-CM | POA: Diagnosis not present

## 2017-03-27 DIAGNOSIS — I872 Venous insufficiency (chronic) (peripheral): Secondary | ICD-10-CM | POA: Diagnosis not present

## 2017-03-27 DIAGNOSIS — S92351D Displaced fracture of fifth metatarsal bone, right foot, subsequent encounter for fracture with routine healing: Secondary | ICD-10-CM | POA: Diagnosis not present

## 2017-03-27 DIAGNOSIS — M1990 Unspecified osteoarthritis, unspecified site: Secondary | ICD-10-CM | POA: Diagnosis not present

## 2017-03-27 DIAGNOSIS — N183 Chronic kidney disease, stage 3 (moderate): Secondary | ICD-10-CM | POA: Diagnosis not present

## 2017-03-27 DIAGNOSIS — I129 Hypertensive chronic kidney disease with stage 1 through stage 4 chronic kidney disease, or unspecified chronic kidney disease: Secondary | ICD-10-CM | POA: Diagnosis not present

## 2017-03-27 DIAGNOSIS — J449 Chronic obstructive pulmonary disease, unspecified: Secondary | ICD-10-CM | POA: Diagnosis not present

## 2017-03-27 NOTE — Telephone Encounter (Signed)
I spoke with [name]; we decided topical plus unna boot for less than 24 hours and reassess I asked her to call me back Thursday afternoon She assured me in her clinical opinion that her leg did not look like a DVT

## 2017-03-28 ENCOUNTER — Other Ambulatory Visit: Payer: Self-pay

## 2017-03-28 ENCOUNTER — Telehealth: Payer: Self-pay

## 2017-03-28 DIAGNOSIS — M1990 Unspecified osteoarthritis, unspecified site: Secondary | ICD-10-CM | POA: Diagnosis not present

## 2017-03-28 DIAGNOSIS — J449 Chronic obstructive pulmonary disease, unspecified: Secondary | ICD-10-CM | POA: Diagnosis not present

## 2017-03-28 DIAGNOSIS — N183 Chronic kidney disease, stage 3 (moderate): Secondary | ICD-10-CM | POA: Diagnosis not present

## 2017-03-28 DIAGNOSIS — M419 Scoliosis, unspecified: Secondary | ICD-10-CM | POA: Diagnosis not present

## 2017-03-28 DIAGNOSIS — S92351D Displaced fracture of fifth metatarsal bone, right foot, subsequent encounter for fracture with routine healing: Secondary | ICD-10-CM | POA: Diagnosis not present

## 2017-03-28 DIAGNOSIS — I872 Venous insufficiency (chronic) (peripheral): Secondary | ICD-10-CM | POA: Diagnosis not present

## 2017-03-28 DIAGNOSIS — I129 Hypertensive chronic kidney disease with stage 1 through stage 4 chronic kidney disease, or unspecified chronic kidney disease: Secondary | ICD-10-CM | POA: Diagnosis not present

## 2017-03-28 DIAGNOSIS — G35 Multiple sclerosis: Secondary | ICD-10-CM | POA: Diagnosis not present

## 2017-03-28 DIAGNOSIS — M5416 Radiculopathy, lumbar region: Secondary | ICD-10-CM | POA: Diagnosis not present

## 2017-03-28 DIAGNOSIS — C8293 Follicular lymphoma, unspecified, intra-abdominal lymph nodes: Secondary | ICD-10-CM | POA: Diagnosis not present

## 2017-03-28 DIAGNOSIS — I700 Atherosclerosis of aorta: Secondary | ICD-10-CM | POA: Diagnosis not present

## 2017-03-28 DIAGNOSIS — I739 Peripheral vascular disease, unspecified: Secondary | ICD-10-CM | POA: Diagnosis not present

## 2017-03-28 DIAGNOSIS — G629 Polyneuropathy, unspecified: Secondary | ICD-10-CM | POA: Diagnosis not present

## 2017-03-28 DIAGNOSIS — I2729 Other secondary pulmonary hypertension: Secondary | ICD-10-CM | POA: Diagnosis not present

## 2017-03-28 MED ORDER — CEPHALEXIN 500 MG PO CAPS: 500.0000 mg | ORAL_CAPSULE | Freq: Two times a day (BID) | ORAL | 0 refills | Status: DC

## 2017-03-28 NOTE — Progress Notes (Signed)
Lana nurse form [facility] called states leg is some what better.  Still has redness and edema.  Wants to see about getting antibiotic keflex.  Also concerned about urine it is cloudy and has odor, wants order for urinalysis and culture.  I consulted with Dr Sanda Klein we will send in keflex and verbal orders were given for urine.

## 2017-03-28 NOTE — Telephone Encounter (Signed)
Copied from [place] [phone]. Topic: General - Other >> Mar 28, 2017  2:49 PM Carolyn Stare wrote:  [name] a nurse with [facility] homehealth  call to say she need to speak with Dr Sanda Klein     can call [phone]

## 2017-03-29 DIAGNOSIS — N183 Chronic kidney disease, stage 3 (moderate): Secondary | ICD-10-CM | POA: Diagnosis not present

## 2017-03-29 DIAGNOSIS — M1990 Unspecified osteoarthritis, unspecified site: Secondary | ICD-10-CM | POA: Diagnosis not present

## 2017-03-29 DIAGNOSIS — I739 Peripheral vascular disease, unspecified: Secondary | ICD-10-CM | POA: Diagnosis not present

## 2017-03-29 DIAGNOSIS — I700 Atherosclerosis of aorta: Secondary | ICD-10-CM | POA: Diagnosis not present

## 2017-03-29 DIAGNOSIS — I2729 Other secondary pulmonary hypertension: Secondary | ICD-10-CM | POA: Diagnosis not present

## 2017-03-29 DIAGNOSIS — J449 Chronic obstructive pulmonary disease, unspecified: Secondary | ICD-10-CM | POA: Diagnosis not present

## 2017-03-29 DIAGNOSIS — M5416 Radiculopathy, lumbar region: Secondary | ICD-10-CM | POA: Diagnosis not present

## 2017-03-29 DIAGNOSIS — I129 Hypertensive chronic kidney disease with stage 1 through stage 4 chronic kidney disease, or unspecified chronic kidney disease: Secondary | ICD-10-CM | POA: Diagnosis not present

## 2017-03-29 DIAGNOSIS — I872 Venous insufficiency (chronic) (peripheral): Secondary | ICD-10-CM | POA: Diagnosis not present

## 2017-03-29 DIAGNOSIS — G35 Multiple sclerosis: Secondary | ICD-10-CM | POA: Diagnosis not present

## 2017-03-29 DIAGNOSIS — S92351D Displaced fracture of fifth metatarsal bone, right foot, subsequent encounter for fracture with routine healing: Secondary | ICD-10-CM | POA: Diagnosis not present

## 2017-03-31 ENCOUNTER — Encounter: Payer: Self-pay | Admitting: Emergency Medicine

## 2017-03-31 ENCOUNTER — Emergency Department
Admission: EM | Admit: 2017-03-31 | Discharge: 2017-03-31 | Disposition: A | Discharge status: Home / Self Care | Payer: Medicare Other | Source: Home / Self Care | Attending: Emergency Medicine | Admitting: Emergency Medicine

## 2017-03-31 ENCOUNTER — Telehealth: Payer: Self-pay

## 2017-03-31 DIAGNOSIS — B9689 Other specified bacterial agents as the cause of diseases classified elsewhere: Secondary | ICD-10-CM | POA: Diagnosis not present

## 2017-03-31 DIAGNOSIS — G35 Multiple sclerosis: Secondary | ICD-10-CM | POA: Diagnosis not present

## 2017-03-31 DIAGNOSIS — C8293 Follicular lymphoma, unspecified, intra-abdominal lymph nodes: Secondary | ICD-10-CM

## 2017-03-31 DIAGNOSIS — N3000 Acute cystitis without hematuria: Secondary | ICD-10-CM | POA: Diagnosis not present

## 2017-03-31 DIAGNOSIS — I714 Abdominal aortic aneurysm, without rupture: Secondary | ICD-10-CM | POA: Insufficient documentation

## 2017-03-31 DIAGNOSIS — S92351D Displaced fracture of fifth metatarsal bone, right foot, subsequent encounter for fracture with routine healing: Secondary | ICD-10-CM | POA: Diagnosis not present

## 2017-03-31 DIAGNOSIS — I700 Atherosclerosis of aorta: Secondary | ICD-10-CM | POA: Diagnosis not present

## 2017-03-31 DIAGNOSIS — I129 Hypertensive chronic kidney disease with stage 1 through stage 4 chronic kidney disease, or unspecified chronic kidney disease: Secondary | ICD-10-CM

## 2017-03-31 DIAGNOSIS — G4730 Sleep apnea, unspecified: Secondary | ICD-10-CM | POA: Diagnosis not present

## 2017-03-31 DIAGNOSIS — Z87891 Personal history of nicotine dependence: Secondary | ICD-10-CM | POA: Insufficient documentation

## 2017-03-31 DIAGNOSIS — S299XXA Unspecified injury of thorax, initial encounter: Secondary | ICD-10-CM | POA: Diagnosis not present

## 2017-03-31 DIAGNOSIS — L03115 Cellulitis of right lower limb: Secondary | ICD-10-CM | POA: Diagnosis not present

## 2017-03-31 DIAGNOSIS — J449 Chronic obstructive pulmonary disease, unspecified: Secondary | ICD-10-CM

## 2017-03-31 DIAGNOSIS — M1990 Unspecified osteoarthritis, unspecified site: Secondary | ICD-10-CM | POA: Diagnosis not present

## 2017-03-31 DIAGNOSIS — R6520 Severe sepsis without septic shock: Secondary | ICD-10-CM | POA: Diagnosis not present

## 2017-03-31 DIAGNOSIS — M419 Scoliosis, unspecified: Secondary | ICD-10-CM | POA: Diagnosis not present

## 2017-03-31 DIAGNOSIS — N179 Acute kidney failure, unspecified: Secondary | ICD-10-CM | POA: Diagnosis not present

## 2017-03-31 DIAGNOSIS — Z79899 Other long term (current) drug therapy: Secondary | ICD-10-CM | POA: Insufficient documentation

## 2017-03-31 DIAGNOSIS — Z960 Presence of urogenital implants: Secondary | ICD-10-CM | POA: Diagnosis not present

## 2017-03-31 DIAGNOSIS — R600 Localized edema: Secondary | ICD-10-CM | POA: Diagnosis not present

## 2017-03-31 DIAGNOSIS — A419 Sepsis, unspecified organism: Secondary | ICD-10-CM | POA: Diagnosis not present

## 2017-03-31 DIAGNOSIS — N390 Urinary tract infection, site not specified: Secondary | ICD-10-CM | POA: Diagnosis not present

## 2017-03-31 DIAGNOSIS — S81812D Laceration without foreign body, left lower leg, subsequent encounter: Secondary | ICD-10-CM | POA: Diagnosis not present

## 2017-03-31 DIAGNOSIS — R55 Syncope and collapse: Secondary | ICD-10-CM | POA: Diagnosis not present

## 2017-03-31 DIAGNOSIS — M5416 Radiculopathy, lumbar region: Secondary | ICD-10-CM | POA: Diagnosis not present

## 2017-03-31 DIAGNOSIS — R402414 Glasgow coma scale score 13-15, 24 hours or more after hospital admission: Secondary | ICD-10-CM | POA: Diagnosis not present

## 2017-03-31 DIAGNOSIS — T83511A Infection and inflammatory reaction due to indwelling urethral catheter, initial encounter: Secondary | ICD-10-CM | POA: Diagnosis not present

## 2017-03-31 DIAGNOSIS — E860 Dehydration: Secondary | ICD-10-CM | POA: Diagnosis not present

## 2017-03-31 DIAGNOSIS — G709 Myoneural disorder, unspecified: Secondary | ICD-10-CM | POA: Diagnosis not present

## 2017-03-31 DIAGNOSIS — E871 Hypo-osmolality and hyponatremia: Secondary | ICD-10-CM | POA: Diagnosis not present

## 2017-03-31 DIAGNOSIS — R9431 Abnormal electrocardiogram [ECG] [EKG]: Secondary | ICD-10-CM | POA: Diagnosis not present

## 2017-03-31 DIAGNOSIS — N183 Chronic kidney disease, stage 3 (moderate): Secondary | ICD-10-CM

## 2017-03-31 DIAGNOSIS — S0990XA Unspecified injury of head, initial encounter: Secondary | ICD-10-CM | POA: Diagnosis not present

## 2017-03-31 DIAGNOSIS — I2729 Other secondary pulmonary hypertension: Secondary | ICD-10-CM | POA: Diagnosis not present

## 2017-03-31 DIAGNOSIS — L03116 Cellulitis of left lower limb: Secondary | ICD-10-CM | POA: Insufficient documentation

## 2017-03-31 DIAGNOSIS — N170 Acute kidney failure with tubular necrosis: Secondary | ICD-10-CM | POA: Diagnosis not present

## 2017-03-31 DIAGNOSIS — I10 Essential (primary) hypertension: Secondary | ICD-10-CM | POA: Diagnosis not present

## 2017-03-31 DIAGNOSIS — I739 Peripheral vascular disease, unspecified: Secondary | ICD-10-CM | POA: Diagnosis not present

## 2017-03-31 DIAGNOSIS — R296 Repeated falls: Secondary | ICD-10-CM | POA: Diagnosis not present

## 2017-03-31 LAB — COMPREHENSIVE METABOLIC PANEL
ALT: 24 U/L (ref 14–54)
AST: 27 U/L (ref 15–41)
Albumin: 3.9 g/dL (ref 3.5–5.0)
Alkaline Phosphatase: 76 U/L (ref 38–126)
Anion gap: 11 (ref 5–15)
BUN: 17 mg/dL (ref 6–20)
CO2: 26 mmol/L (ref 22–32)
Calcium: 9.5 mg/dL (ref 8.9–10.3)
Chloride: 97 mmol/L — ABNORMAL LOW (ref 101–111)
Creatinine, Ser: 1.19 mg/dL — ABNORMAL HIGH (ref 0.44–1.00)
GFR calc Af Amer: 54 mL/min — ABNORMAL LOW (ref >60)
GFR calc non Af Amer: 47 mL/min — ABNORMAL LOW (ref >60)
Glucose, Bld: 106 mg/dL — ABNORMAL HIGH (ref 65–99)
Potassium: 4.2 mmol/L (ref 3.5–5.1)
Sodium: 134 mmol/L — ABNORMAL LOW (ref 135–145)
Total Bilirubin: 0.6 mg/dL (ref 0.3–1.2)
Total Protein: 7.1 g/dL (ref 6.5–8.1)

## 2017-03-31 LAB — CBC WITH DIFFERENTIAL/PLATELET
Basophils Absolute: 0.1 10*3/uL (ref 0–0.1)
Basophils Relative: 1 %
Eosinophils Absolute: 0.3 10*3/uL (ref 0–0.7)
Eosinophils Relative: 6 %
HCT: 35.9 % (ref 35.0–47.0)
Hemoglobin: 11.8 g/dL — ABNORMAL LOW (ref 12.0–16.0)
Lymphocytes Relative: 5 %
Lymphs Abs: 0.3 10*3/uL — ABNORMAL LOW (ref 1.0–3.6)
MCH: 29.4 pg (ref 26.0–34.0)
MCHC: 33 g/dL (ref 32.0–36.0)
MCV: 89.1 fL (ref 80.0–100.0)
Monocytes Absolute: 0.3 10*3/uL (ref 0.2–0.9)
Monocytes Relative: 6 %
Neutro Abs: 4.2 10*3/uL (ref 1.4–6.5)
Neutrophils Relative %: 82 %
Platelets: 260 10*3/uL (ref 150–440)
RBC: 4.03 MIL/uL (ref 3.80–5.20)
RDW: 16.7 % — ABNORMAL HIGH (ref 11.5–14.5)
WBC: 5.1 10*3/uL (ref 3.6–11.0)

## 2017-03-31 MED ORDER — CEPHALEXIN 500 MG PO CAPS: 500.0000 mg | ORAL_CAPSULE | Freq: Four times a day (QID) | ORAL | 0 refills | Status: DC

## 2017-03-31 MED ORDER — ACETAMINOPHEN 500 MG PO TABS
1000.0000 mg | ORAL_TABLET | Freq: Once | ORAL | Status: AC
  Administered 2017-03-31: 1000 mg via ORAL
  Filled 2017-03-31: qty 2

## 2017-03-31 MED ORDER — SULFAMETHOXAZOLE-TRIMETHOPRIM 800-160 MG PO TABS
1.0000 | ORAL_TABLET | Freq: Once | ORAL | Status: AC
  Administered 2017-03-31: 1 via ORAL
  Filled 2017-03-31: qty 1

## 2017-03-31 MED ORDER — CEPHALEXIN 500 MG PO CAPS
500.0000 mg | ORAL_CAPSULE | Freq: Once | ORAL | Status: AC
  Administered 2017-03-31: 500 mg via ORAL
  Filled 2017-03-31: qty 1

## 2017-03-31 MED ORDER — SULFAMETHOXAZOLE-TRIMETHOPRIM 800-160 MG PO TABS: 1.0000 | ORAL_TABLET | Freq: Two times a day (BID) | ORAL | 0 refills | Status: DC

## 2017-03-31 NOTE — ED Triage Notes (Signed)
Pt in via [facility] from home with complaints of ongoing cellulitis to LLE, pt reports taking Keflex x one week without any progress.  Pt A/Ox4, vitals WDL, NAD noted at this time.

## 2017-03-31 NOTE — Discharge Instructions (Addendum)
Please increase your dose of Keflex to 500 mg by mouth 4 times a day and add on Bactrim 1 tab by mouth twice a day for the next 10 days.  Please make an appointment to follow-up with your primary care physician in 2 days for recheck.  Return to the emergency department for any concerns whatsoever such as fevers, chills, worsening pain, if the redness spreads, or for any other concerns whatsoever.  It was a pleasure to take care of you today, and thank you for coming to our emergency department.  If you have any questions or concerns before leaving please ask the nurse to grab me and I'm more than happy to go through your aftercare instructions again.  If you were prescribed any opioid pain medication today such as Norco, Vicodin, Percocet, morphine, hydrocodone, or oxycodone please make sure you do not drive when you are taking this medication as it can alter your ability to drive safely.  If you have any concerns once you are home that you are not improving or are in fact getting worse before you can make it to your follow-up appointment, please do not hesitate to call 911 and come back for further evaluation.  Darel Hong, MD  Results for orders placed or performed during the hospital encounter of 03/31/17  Comprehensive metabolic panel  Result Value Ref Range   Sodium 134 (L) 135 - 145 mmol/L   Potassium 4.2 3.5 - 5.1 mmol/L   Chloride 97 (L) 101 - 111 mmol/L   CO2 26 22 - 32 mmol/L   Glucose, Bld 106 (H) 65 - 99 mg/dL   BUN 17 6 - 20 mg/dL   Creatinine, Ser 1.19 (H) 0.44 - 1.00 mg/dL   Calcium 9.5 8.9 - 10.3 mg/dL   Total Protein 7.1 6.5 - 8.1 g/dL   Albumin 3.9 3.5 - 5.0 g/dL   AST 27 15 - 41 U/L   ALT 24 14 - 54 U/L   Alkaline Phosphatase 76 38 - 126 U/L   Total Bilirubin 0.6 0.3 - 1.2 mg/dL   GFR calc non Af Amer 47 (L) >60 mL/min   GFR calc Af Amer 54 (L) >60 mL/min   Anion gap 11 5 - 15  CBC with Differential  Result Value Ref Range   WBC 5.1 3.6 - 11.0 K/uL   RBC 4.03  3.80 - 5.20 MIL/uL   Hemoglobin 11.8 (L) 12.0 - 16.0 g/dL   HCT 35.9 35.0 - 47.0 %   MCV 89.1 80.0 - 100.0 fL   MCH 29.4 26.0 - 34.0 pg   MCHC 33.0 32.0 - 36.0 g/dL   RDW 16.7 (H) 11.5 - 14.5 %   Platelets 260 150 - 440 K/uL   Neutrophils Relative % 82 %   Neutro Abs 4.2 1.4 - 6.5 K/uL   Lymphocytes Relative 5 %   Lymphs Abs 0.3 (L) 1.0 - 3.6 K/uL   Monocytes Relative 6 %   Monocytes Absolute 0.3 0.2 - 0.9 K/uL   Eosinophils Relative 6 %   Eosinophils Absolute 0.3 0 - 0.7 K/uL   Basophils Relative 1 %   Basophils Absolute 0.1 0 - 0.1 K/uL

## 2017-03-31 NOTE — Telephone Encounter (Signed)
Copied from [place]. Topic: Inquiry >> Mar 31, 2017  2:17 PM Conception Chancy, NT wrote: Reason for CRM: [facility] sandra is calling to inform Dr. Sanda Klein that , left leg they have been wrapping it has some redness, swelling, pain to that leg. Antibiotic started on Friday, the patient refused to go to the ER on Friday but Today she is agreeing to go to the ER and that is where she is headed   [place]

## 2017-03-31 NOTE — ED Provider Notes (Signed)
[facility] Emergency Department Provider Note  ____________________________________________   First MD Initiated Contact with Patient 03/31/17 1526     (approximate)  I have reviewed the triage vital signs and the nursing notes.   HISTORY  Chief Complaint Cellulitis   HPI [name] is a 66 y.o. female who comes to the emergency department via EMS with 7 days of gradual onset slowly progressive left lower extremity swelling, mild pain, redness, and warmth.  Initially her primary care physician prescribed Bactroban which did not help.  She has subsequently been taking Keflex 500 mg twice a day which does not seem to be helping.  Apparently 3 days ago her home health nurse was concerned that she might be septic and advised her to come to the hospital however she refused.  Patient had no fevers or chills.  No chest pain or shortness of breath.  Nothing seems to make her symptoms better or worse.  Her pain is mild severity and nonradiating.  Past Medical History:  Diagnosis Date  . Abdominal aortic atherosclerosis ([place]) 11/11/2016  . ADHD   . Anxiety   . COPD (chronic obstructive pulmonary disease) ([place])   . Depression    major depressive  . Dyspnea    doe  . Edema    left leg  . Follicular lymphoma ([place])    B Cell  . Follicular lymphoma grade II ([place])   . Hypertension   . Hypotension    idiopathic  . Kyphoscoliosis and scoliosis 11/26/2011  . Morbid obesity ([place]) 01/05/2016  . Multiple sclerosis ([place])   . Multiple sclerosis ([place])    1980's  . Neuromuscular disorder ([place])   . Obstructive and reflux uropathy    foley  . Pain    atypical facial  . Peripheral vascular disease of lower extremity with ulceration ([place]) 11/08/2015  . Skin ulcer ([place]) 11/08/2015  . Weakness    generalized. has MS    Patient Active Problem List   Diagnosis Date Noted  . Sepsis ([place]) 02/06/2017  . Foot pain, bilateral 02/03/2017  . Tinea pedis 02/03/2017  . Lymphoma  ([place]) 01/07/2017  . Pseudomonas urinary tract infection 01/02/2017  . Cellulitis and abscess of leg 12/29/2016  . Left ovarian cyst 12/09/2016  . Abdominal aortic atherosclerosis ([place]) 11/11/2016  . Cellulitis of left lower extremity 10/11/2016  . Swelling of lower extremity 10/11/2016  . Obstructive sleep apnea 10/11/2016  . Bilateral lower leg cellulitis 10/11/2016  . Follicular lymphoma of intra-abdominal lymph nodes ([place]) 08/06/2016  . Multiple falls 06/07/2016  . Inguinal adenopathy 05/29/2016  . Breast cancer screening 01/05/2016  . Obesity, morbid ([place]) 01/05/2016  . Pressure ulcer 12/19/2015  . Low HDL (under 40) 12/19/2015  . Cellulitis 12/18/2015  . Skin ulcer ([place]) 11/08/2015  . Peripheral vascular disease of lower extremity with ulceration ([place]) 11/08/2015  . CKD (chronic kidney disease) stage 3, GFR 30-59 ml/min ([facility]) 11/08/2015  . Decubitus ulcer of left thigh, stage 2 04/18/2015  . Constipation due to pain medication 01/31/2015  . Obstructive sleep apnea of adult 01/13/2015  . Pelvic muscle wasting 01/13/2015  . Incomplete bladder emptying 01/13/2015  . Headache, migraine 10/24/2014  . 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 Current tobacco use 10/24/2014  . Lumbar radiculopathy, chronic 10/02/2013  . COPD with bronchial hyperresponsiveness ([place]) 10/02/2013  . Major depressive disorder, recurrent, in partial remission ([place]) 10/02/2013  . Hypertension goal BP (blood pressure) < 140/90 10/02/2013  . Multiple sclerosis ([place]) 10/02/2013  . Major  depression in remission ([place]) 10/02/2013  . Absence of bladder continence 09/25/2012  . Acontractile bladder 02/12/2012  . Bladder retention 02/12/2012  . Narrowing of intervertebral disc space 11/26/2011  . Kyphoscoliosis and scoliosis 11/26/2011    Past Surgical History:  Procedure Laterality Date  . BACK SURGERY N/A 2002  . CYST EXCISION     lower back  . INGUINAL LYMPH NODE BIOPSY Left 07/04/2016   Procedure: INGUINAL  LYMPH NODE BIOPSY;  Surgeon: Christene Lye, MD;  Location: [facility] ORS;  Service: General;  Laterality: Left;  . PORTACATH PLACEMENT N/A 07/22/2016   Procedure: INSERTION PORT-A-CATH;  Surgeon: Christene Lye, MD;  Location: [facility] ORS;  Service: General;  Laterality: N/A;  . TONSILLECTOMY AND ADENOIDECTOMY    . TUBAL LIGATION      Prior to Admission medications   Medication Sig Start Date End Date Taking? Authorizing Provider  BIOTIN PO Take by mouth daily. Pt taking 500 mcg daily    [provider]  buPROPion (WELLBUTRIN XL) 150 MG 24 hr tablet Take by mouth.    [provider]  buPROPion (WELLBUTRIN XL) 300 MG 24 hr tablet Take 300 mg by mouth every morning.  05/28/16   [provider]  cephALEXin (KEFLEX) 500 MG capsule Take 1 capsule (500 mg total) by mouth 4 (four) times daily for 10 days. 03/31/17 04/10/17  Darel Hong, MD  clindamycin (CLEOCIN) 300 MG capsule Take 1 capsule (300 mg total) by mouth every 6 (six) hours. 02/17/17   Fritzi Mandes, MD  clonazePAM (KLONOPIN) 1 MG tablet Take 1 tablet (1 mg total) by mouth at bedtime. 01/06/17   [name], MD  clonazePAM (KLONOPIN) 1 MG tablet Take by mouth.    [provider]  Diapers & Supplies (HUGGIES PULL-UPS) MISC 1 each by Does not apply route 2 (two) times daily. Dx urinary incontinence, MS; LON 99 months 02/03/17   Arnetha Courser, MD  DULoxetine (CYMBALTA) 60 MG capsule Take 1 capsule (60 mg total) by mouth every morning. 10/14/16   Vaughan Basta, MD  DULoxetine (CYMBALTA) 60 MG capsule Take by mouth.    [provider]  fluticasone furoate-vilanterol (BREO ELLIPTA) 100-25 MCG/INH AEPB Inhale 1 puff into the lungs daily. 01/05/16   Lada, Satira Anis, MD  fluticasone furoate-vilanterol (BREO ELLIPTA) 100-25 MCG/INH AEPB Inhale into the lungs. 08/16/15   [provider]  gabapentin (NEURONTIN) 600 MG tablet Take 1 tablet (600 mg total) by mouth 3 (three) times daily.  02/09/17   Bettey Costa, MD  gabapentin (NEURONTIN) 600 MG tablet Take by mouth.    [provider]  hydrochlorothiazide (MICROZIDE) 12.5 MG capsule Take 12.5 mg by mouth daily. 12/19/16   [provider]  interferon beta-1a (AVONEX PREFILLED) 30 MCG/0.5ML PSKT injection Inject into the muscle.    [provider]  interferon beta-1a (AVONEX) 30 MCG injection Inject into the muscle.    [provider]  interferon beta-1a (AVONEX) 30 MCG/0.5ML PSKT injection Inject 30 mcg into the muscle every Monday.    [provider]  lidocaine-prilocaine (EMLA) cream Apply 1 application topically as needed. apply cream over the port site and then place small piece of saran wrap over the cream to protect your clothing 11/07/16   Sindy Guadeloupe, MD  lisinopril (PRINIVIL,ZESTRIL) 5 MG tablet Take 1 tablet (5 mg total) by mouth daily. 11/13/16   Arnetha Courser, MD  methylphenidate (RITALIN) 10 MG tablet Take 1 tablet (10 mg total) by mouth 2 (two)  times daily. 10/14/16   Vaughan Basta, MD  Multiple Vitamin (MULTI-VITAMINS) TABS Take by mouth.    [provider]  Multiple Vitamin (MULTIVITAMIN WITH MINERALS) TABS tablet Take 1 tablet by mouth daily.    [provider]  mupirocin ointment (BACTROBAN) 2 % Apply 1 application topically 2 (two) times daily. 03/25/17   Arnetha Courser, MD  naproxen (NAPROSYN) 500 MG tablet Take 450 mg by mouth.    [provider]  nystatin cream (MYCOSTATIN) Apply topically daily. 02/09/17   Bettey Costa, MD  oxyCODONE-acetaminophen (PERCOCET/ROXICET) 5-325 MG tablet Take by mouth. 01/10/15   [provider]  polyethylene glycol powder (GLYCOLAX/MIRALAX) powder Take 17 g by mouth daily as needed for mild constipation. Mixed in water 01/06/17   Sudini, Alveta Heimlich, MD  potassium chloride (K-DUR) 10 MEQ tablet TAKE ONE (1) TABLET EACH DAY 01/17/15   [provider]  predniSONE (DELTASONE) 10 MG tablet 60 mg  on day 1, 50 mg on day 2, 40 mg on day 3, 30 mg on day 4, 20 mg on day 5, 10 mg on day 6 then stop 12/10/16   [provider]  QUEtiapine Fumarate (SEROQUEL XR) 150 MG 24 hr tablet Take 150 mg by mouth at bedtime.     [provider]  QUEtiapine Fumarate (SEROQUEL XR) 150 MG 24 hr tablet Take by mouth. 12/02/13   [provider]  senna-docusate (SENOKOT-S) 8.6-50 MG tablet Take 1 tablet by mouth daily as needed for mild constipation.     [provider]  solifenacin (VESICARE) 10 MG tablet Take 10 mg by mouth daily.     [provider]  solifenacin (VESICARE) 10 MG tablet Take 10 mg by mouth. 10/04/16   [provider]  sulfamethoxazole-trimethoprim (BACTRIM DS,SEPTRA DS) 800-160 MG tablet Take 1 tablet by mouth 2 (two) times daily for 10 days. 03/31/17 04/10/17  Darel Hong, MD  tiZANidine (ZANAFLEX) 4 MG tablet Take 1 tablet (4 mg total) by mouth 4 (four) times daily. TAKES FOR MS 10/14/16   Vaughan Basta, MD  topiramate (TOPAMAX) 50 MG tablet Take 1 tablet (50 mg total) by mouth daily. 10/14/16   Vaughan Basta, MD  topiramate (TOPAMAX) 50 MG tablet Take by mouth.    [provider]  triamterene-hydrochlorothiazide (DYAZIDE) 37.5-25 MG capsule Take by mouth. 11/24/13   [provider]  triamterene-hydrochlorothiazide (DYAZIDE) 37.5-25 MG capsule 2 capsules. 07/26/13   [provider]  vitamin C (ASCORBIC ACID) 500 MG tablet Take 500 mg by mouth daily.    [provider]    Allergies Patient has no known allergies.  Family History  Problem Relation Age of Onset  . COPD Mother   . Diabetes Mother   . Heart failure Mother   . Alcohol abuse Father   . Kidney disease Father   . Kidney failure Father   . Arthritis Sister   . CAD Maternal Grandmother   . Stroke Maternal Grandfather   . Arthritis Sister   . Mental illness Sister   . Arthritis Brother     Social History Social History    Tobacco Use  . Smoking status: Former Smoker    Packs/day: 1.00    Years: 20.00    Pack years: 20.00    Types: Cigarettes    Last attempt to quit: 02/03/2016    Years since quitting: 1.1  . Smokeless tobacco: Never Used  Substance Use Topics  . Alcohol use: No    Alcohol/week: 0.0 oz  .  Drug use: Yes    Types: Marijuana    Comment: smokes THC daily per pt     Review of Systems Constitutional: No fever/chills Eyes: No visual changes. ENT: No sore throat. Cardiovascular: Denies chest pain. Respiratory: Denies shortness of breath. Gastrointestinal: No abdominal pain.  No nausea, no vomiting.  No diarrhea.  No constipation. Genitourinary: Negative for dysuria. Musculoskeletal: Negative for back pain. Skin: Positive for rash Neurological: Negative for headaches, focal weakness or numbness.   ____________________________________________   PHYSICAL EXAM:  VITAL SIGNS: ED Triage Vitals  Enc Vitals Group     BP      Pulse      Resp      Temp      Temp src      SpO2      Weight      Height      Head Circumference      Peak Flow      Pain Score      Pain Loc      Pain Edu?      Excl. in [place]?     Constitutional: Alert and oriented x4 joking laughing well-appearing nontoxic no diaphoresis speaks in full clear sentences Eyes: PERRL EOMI. Head: Atraumatic. Nose: No congestion/rhinnorhea. Mouth/Throat: No trismus Neck: No stridor.   Cardiovascular: Normal rate, regular rhythm. Grossly normal heart sounds.  Good peripheral circulation. Respiratory: Normal respiratory effort.  No retractions. Lungs CTAB and moving good air Gastrointestinal: Obese soft nontender.  Foley catheter in place with clear appearing urine Musculoskeletal: Left lower extremity with 2+ pitting edema to mid shin.  None on the right Neurologic:  Normal speech and language. No gross focal neurologic deficits are appreciated. Skin: Right lower extremity with erythema warmth tenderness and edema from  the toes up to about one third up the shin.  No bulla blister sloughing or crepitus.  2+ dorsalis pedis pulse.  No signs of necrotizing soft tissue infection Psychiatric: Mood and affect are normal. Speech and behavior are normal.    ____________________________________________   DIFFERENTIAL includes but not limited to  Cellulitis, necrotizing fasciitis, erysipelas, venous stasis, deep vein thrombosis ____________________________________________   LABS (all labs ordered are listed, but only abnormal results are displayed)  Labs Reviewed  COMPREHENSIVE METABOLIC PANEL - Abnormal; Notable for the following components:      Result Value   Sodium 134 (*)    Chloride 97 (*)    Glucose, Bld 106 (*)    Creatinine, Ser 1.19 (*)    GFR calc non Af Amer 47 (*)    GFR calc Af Amer 54 (*)    All other components within normal limits  CBC WITH DIFFERENTIAL/PLATELET - Abnormal; Notable for the following components:   Hemoglobin 11.8 (*)    RDW 16.7 (*)    Lymphs Abs 0.3 (*)    All other components within normal limits    Blood work reviewed by me shows improving renal function from last draw. __________________________________________  EKG   ____________________________________________  RADIOLOGY   ____________________________________________   PROCEDURES  Procedure(s) performed: no  Procedures  Critical Care performed: no  Observation: no ____________________________________________   INITIAL IMPRESSION / ASSESSMENT AND PLAN / ED COURSE  Pertinent labs & imaging results that were available during my care of the patient were reviewed by me and considered in my medical decision making (see chart for details).  The patient arrives hemodynamically stable, very well-appearing, and clearly not septic.  The antibiotic regimen that she has been taking  is not adequate to cover typical skin flora.  Cephalexin should be 3-4 times a day and she should also have Bactrim DS for  MRSA coverage.  I have marked off her cellulitis right now and will give her a dose of oral antibiotics.  Anticipate discharge home.     ----------------------------------------- 4:41 PM on 03/31/2017 -----------------------------------------  The patient's blood work is reassuring and her cellulitis has not progressed ever since I marked it off.  I had a lengthy discussion with the patient and her daughter at bedside about increasing her home dose of Keflex to 500 mg by mouth 4 times a day and adding on Bactrim for staph coverage.  Strict return precautions have been given in a 2-day wound check as well.  The patient and her daughter verbalized understanding and agreement the plan. ____________________________________________   FINAL CLINICAL IMPRESSION(S) / ED DIAGNOSES  Final diagnoses:  Cellulitis of left lower extremity      NEW MEDICATIONS STARTED DURING THIS VISIT:  This SmartLink is deprecated. Use AVSMEDLIST instead to display the medication list for a patient.   Note:  This document was prepared using Dragon voice recognition software and may include unintentional dictation errors.     Darel Hong, MD 03/31/17 1642

## 2017-03-31 NOTE — ED Notes (Signed)
EDP to bedside to update patient and family. 

## 2017-04-01 ENCOUNTER — Telehealth: Payer: Self-pay

## 2017-04-01 ENCOUNTER — Other Ambulatory Visit: Payer: Self-pay

## 2017-04-01 ENCOUNTER — Emergency Department: Payer: Medicare Other

## 2017-04-01 ENCOUNTER — Inpatient Hospital Stay
Admission: EM | Admit: 2017-04-01 | Discharge: 2017-04-05 | DRG: 698 | Disposition: A | Discharge status: Home Health | Payer: Medicare Other | Attending: Internal Medicine | Admitting: Internal Medicine

## 2017-04-01 DIAGNOSIS — R296 Repeated falls: Secondary | ICD-10-CM | POA: Diagnosis present

## 2017-04-01 DIAGNOSIS — B9689 Other specified bacterial agents as the cause of diseases classified elsewhere: Secondary | ICD-10-CM | POA: Diagnosis present

## 2017-04-01 DIAGNOSIS — N139 Obstructive and reflux uropathy, unspecified: Secondary | ICD-10-CM | POA: Diagnosis present

## 2017-04-01 DIAGNOSIS — F909 Attention-deficit hyperactivity disorder, unspecified type: Secondary | ICD-10-CM | POA: Diagnosis present

## 2017-04-01 DIAGNOSIS — J449 Chronic obstructive pulmonary disease, unspecified: Secondary | ICD-10-CM | POA: Diagnosis present

## 2017-04-01 DIAGNOSIS — A419 Sepsis, unspecified organism: Secondary | ICD-10-CM | POA: Diagnosis present

## 2017-04-01 DIAGNOSIS — E860 Dehydration: Secondary | ICD-10-CM | POA: Diagnosis present

## 2017-04-01 DIAGNOSIS — F329 Major depressive disorder, single episode, unspecified: Secondary | ICD-10-CM | POA: Diagnosis present

## 2017-04-01 DIAGNOSIS — G4730 Sleep apnea, unspecified: Secondary | ICD-10-CM | POA: Diagnosis present

## 2017-04-01 DIAGNOSIS — L03115 Cellulitis of right lower limb: Secondary | ICD-10-CM | POA: Diagnosis present

## 2017-04-01 DIAGNOSIS — W1830XA Fall on same level, unspecified, initial encounter: Secondary | ICD-10-CM | POA: Diagnosis present

## 2017-04-01 DIAGNOSIS — Z79899 Other long term (current) drug therapy: Secondary | ICD-10-CM

## 2017-04-01 DIAGNOSIS — L0390 Cellulitis, unspecified: Secondary | ICD-10-CM | POA: Diagnosis present

## 2017-04-01 DIAGNOSIS — Y732 Prosthetic and other implants, materials and accessory gastroenterology and urology devices associated with adverse incidents: Secondary | ICD-10-CM | POA: Diagnosis present

## 2017-04-01 DIAGNOSIS — R402414 Glasgow coma scale score 13-15, 24 hours or more after hospital admission: Secondary | ICD-10-CM | POA: Diagnosis not present

## 2017-04-01 DIAGNOSIS — Z6841 Body Mass Index (BMI) 40.0 and over, adult: Secondary | ICD-10-CM

## 2017-04-01 DIAGNOSIS — Z791 Long term (current) use of non-steroidal anti-inflammatories (NSAID): Secondary | ICD-10-CM

## 2017-04-01 DIAGNOSIS — G35 Multiple sclerosis: Secondary | ICD-10-CM | POA: Diagnosis present

## 2017-04-01 DIAGNOSIS — Z87891 Personal history of nicotine dependence: Secondary | ICD-10-CM

## 2017-04-01 DIAGNOSIS — Y9301 Activity, walking, marching and hiking: Secondary | ICD-10-CM | POA: Diagnosis present

## 2017-04-01 DIAGNOSIS — W19XXXA Unspecified fall, initial encounter: Secondary | ICD-10-CM

## 2017-04-01 DIAGNOSIS — Z923 Personal history of irradiation: Secondary | ICD-10-CM

## 2017-04-01 DIAGNOSIS — Y846 Urinary catheterization as the cause of abnormal reaction of the patient, or of later complication, without mention of misadventure at the time of the procedure: Secondary | ICD-10-CM | POA: Diagnosis present

## 2017-04-01 DIAGNOSIS — R6520 Severe sepsis without septic shock: Secondary | ICD-10-CM | POA: Diagnosis present

## 2017-04-01 DIAGNOSIS — Z960 Presence of urogenital implants: Secondary | ICD-10-CM | POA: Diagnosis present

## 2017-04-01 DIAGNOSIS — I739 Peripheral vascular disease, unspecified: Secondary | ICD-10-CM | POA: Diagnosis present

## 2017-04-01 DIAGNOSIS — G709 Myoneural disorder, unspecified: Secondary | ICD-10-CM | POA: Diagnosis present

## 2017-04-01 DIAGNOSIS — N170 Acute kidney failure with tubular necrosis: Secondary | ICD-10-CM | POA: Diagnosis present

## 2017-04-01 DIAGNOSIS — E871 Hypo-osmolality and hyponatremia: Secondary | ICD-10-CM | POA: Diagnosis present

## 2017-04-01 DIAGNOSIS — Z8572 Personal history of non-Hodgkin lymphomas: Secondary | ICD-10-CM

## 2017-04-01 DIAGNOSIS — Z8679 Personal history of other diseases of the circulatory system: Secondary | ICD-10-CM

## 2017-04-01 DIAGNOSIS — N3000 Acute cystitis without hematuria: Secondary | ICD-10-CM | POA: Diagnosis present

## 2017-04-01 DIAGNOSIS — Y92009 Unspecified place in unspecified non-institutional (private) residence as the place of occurrence of the external cause: Secondary | ICD-10-CM | POA: Diagnosis not present

## 2017-04-01 DIAGNOSIS — I10 Essential (primary) hypertension: Secondary | ICD-10-CM | POA: Diagnosis present

## 2017-04-01 DIAGNOSIS — Z79891 Long term (current) use of opiate analgesic: Secondary | ICD-10-CM

## 2017-04-01 DIAGNOSIS — T83511A Infection and inflammatory reaction due to indwelling urethral catheter, initial encounter: Secondary | ICD-10-CM | POA: Diagnosis present

## 2017-04-01 DIAGNOSIS — M419 Scoliosis, unspecified: Secondary | ICD-10-CM | POA: Diagnosis present

## 2017-04-01 DIAGNOSIS — L03116 Cellulitis of left lower limb: Secondary | ICD-10-CM | POA: Diagnosis present

## 2017-04-01 DIAGNOSIS — M7989 Other specified soft tissue disorders: Secondary | ICD-10-CM

## 2017-04-01 LAB — CBC WITH DIFFERENTIAL/PLATELET
Basophils Absolute: 0.1 10*3/uL (ref 0–0.1)
Basophils Relative: 0 %
Eosinophils Absolute: 0.1 10*3/uL (ref 0–0.7)
Eosinophils Relative: 1 %
HCT: 36.6 % (ref 35.0–47.0)
Hemoglobin: 12.2 g/dL (ref 12.0–16.0)
Lymphocytes Relative: 1 %
Lymphs Abs: 0.1 10*3/uL — ABNORMAL LOW (ref 1.0–3.6)
MCH: 29.5 pg (ref 26.0–34.0)
MCHC: 33.4 g/dL (ref 32.0–36.0)
MCV: 88.3 fL (ref 80.0–100.0)
Monocytes Absolute: 0.4 10*3/uL (ref 0.2–0.9)
Monocytes Relative: 3 %
Neutro Abs: 14.1 10*3/uL — ABNORMAL HIGH (ref 1.4–6.5)
Neutrophils Relative %: 95 %
Platelets: 242 10*3/uL (ref 150–440)
RBC: 4.15 MIL/uL (ref 3.80–5.20)
RDW: 17.2 % — ABNORMAL HIGH (ref 11.5–14.5)
WBC: 14.8 10*3/uL — ABNORMAL HIGH (ref 3.6–11.0)

## 2017-04-01 LAB — COMPREHENSIVE METABOLIC PANEL
ALT: 24 U/L (ref 14–54)
AST: 28 U/L (ref 15–41)
Albumin: 4 g/dL (ref 3.5–5.0)
Alkaline Phosphatase: 78 U/L (ref 38–126)
Anion gap: 13 (ref 5–15)
BUN: 21 mg/dL — ABNORMAL HIGH (ref 6–20)
CO2: 23 mmol/L (ref 22–32)
Calcium: 9.9 mg/dL (ref 8.9–10.3)
Chloride: 95 mmol/L — ABNORMAL LOW (ref 101–111)
Creatinine, Ser: 1.76 mg/dL — ABNORMAL HIGH (ref 0.44–1.00)
GFR calc Af Amer: 34 mL/min — ABNORMAL LOW (ref >60)
GFR calc non Af Amer: 29 mL/min — ABNORMAL LOW (ref >60)
Glucose, Bld: 119 mg/dL — ABNORMAL HIGH (ref 65–99)
Potassium: 3.9 mmol/L (ref 3.5–5.1)
Sodium: 131 mmol/L — ABNORMAL LOW (ref 135–145)
Total Bilirubin: 0.6 mg/dL (ref 0.3–1.2)
Total Protein: 7.4 g/dL (ref 6.5–8.1)

## 2017-04-01 LAB — URINALYSIS, COMPLETE (UACMP) WITH MICROSCOPIC
Bacteria, UA: NONE SEEN
Bilirubin Urine: NEGATIVE
Bilirubin Urine: NEGATIVE
Glucose, UA: NEGATIVE mg/dL
Glucose, UA: NEGATIVE mg/dL
Hgb urine dipstick: NEGATIVE
Hgb urine dipstick: NEGATIVE
Ketones, ur: NEGATIVE mg/dL
Ketones, ur: NEGATIVE mg/dL
Nitrite: POSITIVE — AB
Nitrite: NEGATIVE
Protein, ur: 100 mg/dL — AB
Protein, ur: 30 mg/dL — AB
Specific Gravity, Urine: 1.021 (ref 1.005–1.030)
Specific Gravity, Urine: 1.023 (ref 1.005–1.030)
pH: 6 (ref 5.0–8.0)
pH: 5 (ref 5.0–8.0)

## 2017-04-01 LAB — GLUCOSE, CAPILLARY: Glucose-Capillary: 74 mg/dL (ref 65–99)

## 2017-04-01 LAB — TROPONIN I
Troponin I: 0.04 ng/mL (ref <0.03)
Troponin I: 0.09 ng/mL (ref <0.03)

## 2017-04-01 MED ORDER — POTASSIUM CHLORIDE CRYS ER 10 MEQ PO TBCR
10.0000 meq | EXTENDED_RELEASE_TABLET | Freq: Every day | ORAL | Status: DC
  Administered 2017-04-01 – 2017-04-05 (×5): 10 meq via ORAL
  Filled 2017-04-01 (×5): qty 1

## 2017-04-01 MED ORDER — POLYETHYLENE GLYCOL 3350 17 G PO PACK: 17.0000 g | PACK | Freq: Every day | ORAL | Status: DC | PRN

## 2017-04-01 MED ORDER — SODIUM CHLORIDE 0.9 % IV SOLN
Freq: Once | INTRAVENOUS | Status: AC
  Administered 2017-04-01: 15:00:00 via INTRAVENOUS

## 2017-04-01 MED ORDER — QUETIAPINE FUMARATE ER 50 MG PO TB24
150.0000 mg | ORAL_TABLET | Freq: Every day | ORAL | Status: DC
Start: 2017-04-01 — End: 2017-04-05
  Administered 2017-04-01 – 2017-04-04 (×4): 150 mg via ORAL
  Filled 2017-04-01 (×5): qty 3

## 2017-04-01 MED ORDER — OXYCODONE-ACETAMINOPHEN 5-325 MG PO TABS
1.0000 | ORAL_TABLET | Freq: Four times a day (QID) | ORAL | Status: DC | PRN
  Administered 2017-04-02: 15:00:00 1 via ORAL
  Filled 2017-04-01: qty 1

## 2017-04-01 MED ORDER — CLONAZEPAM 0.5 MG PO TABS
1.0000 mg | ORAL_TABLET | Freq: Every day | ORAL | Status: DC
  Administered 2017-04-01 – 2017-04-04 (×4): 1 mg via ORAL
  Filled 2017-04-01 (×4): qty 2

## 2017-04-01 MED ORDER — POLYETHYLENE GLYCOL 3350 17 GM/SCOOP PO POWD: 17.0000 g | Freq: Every day | ORAL | Status: DC | PRN

## 2017-04-01 MED ORDER — SULFAMETHOXAZOLE-TRIMETHOPRIM 800-160 MG PO TABS: 1.0000 | ORAL_TABLET | Freq: Two times a day (BID) | ORAL | Status: DC

## 2017-04-01 MED ORDER — METHYLPHENIDATE HCL 5 MG PO TABS
10.0000 mg | ORAL_TABLET | Freq: Two times a day (BID) | ORAL | Status: DC
  Administered 2017-04-01 – 2017-04-05 (×8): 10 mg via ORAL
  Filled 2017-04-01 (×8): qty 2

## 2017-04-01 MED ORDER — SODIUM CHLORIDE 0.9 % IV SOLN
1.0000 g | Freq: Two times a day (BID) | INTRAVENOUS | Status: DC
  Administered 2017-04-01 – 2017-04-02 (×2): 1 g via INTRAVENOUS
  Filled 2017-04-01 (×3): qty 1

## 2017-04-01 MED ORDER — NYSTATIN 100000 UNIT/GM EX CREA
TOPICAL_CREAM | Freq: Every day | CUTANEOUS | Status: DC | PRN
  Filled 2017-04-01: qty 15

## 2017-04-01 MED ORDER — ACETAMINOPHEN 325 MG PO TABS
650.0000 mg | ORAL_TABLET | Freq: Four times a day (QID) | ORAL | Status: DC | PRN
  Administered 2017-04-01 – 2017-04-05 (×3): 650 mg via ORAL
  Filled 2017-04-01 (×2): qty 2

## 2017-04-01 MED ORDER — DARIFENACIN HYDROBROMIDE ER 7.5 MG PO TB24
7.5000 mg | ORAL_TABLET | Freq: Every day | ORAL | Status: DC
  Administered 2017-04-01 – 2017-04-05 (×5): 7.5 mg via ORAL
  Filled 2017-04-01 (×5): qty 1

## 2017-04-01 MED ORDER — BIOTIN 2.5 MG PO TABS: 50.0000 mg | ORAL_TABLET | Freq: Every day | ORAL | Status: DC

## 2017-04-01 MED ORDER — VANCOMYCIN HCL 10 G IV SOLR
1250.0000 mg | INTRAVENOUS | Status: DC
  Administered 2017-04-01: 1250 mg via INTRAVENOUS
  Filled 2017-04-01 (×3): qty 1250

## 2017-04-01 MED ORDER — SENNOSIDES-DOCUSATE SODIUM 8.6-50 MG PO TABS: 1.0000 | ORAL_TABLET | Freq: Every day | ORAL | Status: DC | PRN

## 2017-04-01 MED ORDER — VANCOMYCIN HCL IN DEXTROSE 1-5 GM/200ML-% IV SOLN
1000.0000 mg | Freq: Once | INTRAVENOUS | Status: AC
  Administered 2017-04-01: 1000 mg via INTRAVENOUS
  Filled 2017-04-01: qty 200

## 2017-04-01 MED ORDER — TOPIRAMATE 25 MG PO TABS
50.0000 mg | ORAL_TABLET | Freq: Every day | ORAL | Status: DC
  Administered 2017-04-01 – 2017-04-05 (×5): 50 mg via ORAL
  Filled 2017-04-01 (×5): qty 2

## 2017-04-01 MED ORDER — LISINOPRIL 5 MG PO TABS
5.0000 mg | ORAL_TABLET | Freq: Every day | ORAL | Status: DC
  Administered 2017-04-01: 5 mg via ORAL
  Filled 2017-04-01: qty 1

## 2017-04-01 MED ORDER — GABAPENTIN 600 MG PO TABS
600.0000 mg | ORAL_TABLET | Freq: Three times a day (TID) | ORAL | Status: DC
  Administered 2017-04-01 – 2017-04-05 (×12): 600 mg via ORAL
  Filled 2017-04-01 (×12): qty 1

## 2017-04-01 MED ORDER — INTERFERON BETA-1A 30 MCG/0.5ML IM PSKT: 30.0000 ug | PREFILLED_SYRINGE | INTRAMUSCULAR | Status: DC

## 2017-04-01 MED ORDER — DULOXETINE HCL 30 MG PO CPEP
60.0000 mg | ORAL_CAPSULE | ORAL | Status: DC
  Administered 2017-04-01 – 2017-04-05 (×5): 60 mg via ORAL
  Filled 2017-04-01 (×5): qty 2

## 2017-04-01 MED ORDER — VITAMIN C 500 MG PO TABS
500.0000 mg | ORAL_TABLET | Freq: Every day | ORAL | Status: DC
  Administered 2017-04-01 – 2017-04-05 (×5): 500 mg via ORAL
  Filled 2017-04-01 (×5): qty 1

## 2017-04-01 MED ORDER — FLUTICASONE FUROATE-VILANTEROL 100-25 MCG/INH IN AEPB
1.0000 | INHALATION_SPRAY | Freq: Every day | RESPIRATORY_TRACT | Status: DC
  Administered 2017-04-01 – 2017-04-05 (×4): 1 via RESPIRATORY_TRACT
  Filled 2017-04-01 (×2): qty 28

## 2017-04-01 MED ORDER — ACETAMINOPHEN 325 MG PO TABS
ORAL_TABLET | ORAL | Status: AC
  Administered 2017-04-01: 650 mg via ORAL
  Filled 2017-04-01: qty 2

## 2017-04-01 MED ORDER — MUPIROCIN 2 % EX OINT
1.0000 "application " | TOPICAL_OINTMENT | Freq: Two times a day (BID) | CUTANEOUS | Status: DC
  Administered 2017-04-01 – 2017-04-03 (×3): 1 via TOPICAL
  Filled 2017-04-01: qty 22

## 2017-04-01 MED ORDER — BUPROPION HCL ER (XL) 300 MG PO TB24
300.0000 mg | ORAL_TABLET | ORAL | Status: DC
  Administered 2017-04-01 – 2017-04-05 (×5): 300 mg via ORAL
  Filled 2017-04-01 (×5): qty 1

## 2017-04-01 MED ORDER — MORPHINE SULFATE (PF) 4 MG/ML IV SOLN
4.0000 mg | Freq: Once | INTRAVENOUS | Status: AC
  Administered 2017-04-01: 4 mg via INTRAVENOUS
  Filled 2017-04-01: qty 1

## 2017-04-01 MED ORDER — ADULT MULTIVITAMIN W/MINERALS CH
1.0000 | ORAL_TABLET | Freq: Every day | ORAL | Status: DC
  Administered 2017-04-01 – 2017-04-05 (×5): 1 via ORAL
  Filled 2017-04-01 (×5): qty 1

## 2017-04-01 MED ORDER — TIZANIDINE HCL 2 MG PO TABS
4.0000 mg | ORAL_TABLET | Freq: Four times a day (QID) | ORAL | Status: DC
  Administered 2017-04-01 – 2017-04-04 (×11): 4 mg via ORAL
  Filled 2017-04-01 (×14): qty 2

## 2017-04-01 NOTE — H&P (Signed)
[facility] NAME: [name]    MR#:  [id number]  DATE OF BIRTH:  02-03-1951  DATE OF ADMISSION:  04/01/2017  PRIMARY CARE PHYSICIAN: Arnetha Courser, MD   REQUESTING/REFERRING PHYSICIAN: Earleen Newport, MD  CHIEF COMPLAINT:   fall HISTORY OF PRESENT ILLNESS:  [name]  is a 66 y.o. female with a known history of chronic multiple sclerosis, essential hypertension, COPD, chronic indwelling Foley catheter is presenting to the ED after she had multiple falls. CT head is negative. Patient reports that she is losing balance as her legs are swollen, red and painful. Failed outpatient antibiotic Keflex and Bactrim. Urine looks abnormal Creatinine is elevated at 1.76. White count is elevated  PAST MEDICAL HISTORY:   Past Medical History:  Diagnosis Date  . Abdominal aortic atherosclerosis ([place]) 11/11/2016  . ADHD   . Anxiety   . COPD (chronic obstructive pulmonary disease) ([place])   . Depression    major depressive  . Dyspnea    doe  . Edema    left leg  . Follicular lymphoma ([place])    B Cell  . Follicular lymphoma grade II ([place])   . Hypertension   . Hypotension    idiopathic  . Kyphoscoliosis and scoliosis 11/26/2011  . Morbid obesity ([place]) 01/05/2016  . Multiple sclerosis ([place])   . Multiple sclerosis ([place])    1980's  . Neuromuscular disorder ([place])   . Obstructive and reflux uropathy    foley  . Pain    atypical facial  . Peripheral vascular disease of lower extremity with ulceration ([place]) 11/08/2015  . Skin ulcer ([place]) 11/08/2015  . Weakness    generalized. has MS    PAST SURGICAL HISTOIRY:   Past Surgical History:  Procedure Laterality Date  . BACK SURGERY N/A 2002  . CYST EXCISION     lower back  . INGUINAL LYMPH NODE BIOPSY Left 07/04/2016   Procedure: INGUINAL LYMPH NODE BIOPSY;  Surgeon: Christene Lye, MD;  Location: [facility] ORS;  Service: General;  Laterality: Left;  . PORTACATH PLACEMENT N/A  07/22/2016   Procedure: INSERTION PORT-A-CATH;  Surgeon: Christene Lye, MD;  Location: [facility] ORS;  Service: General;  Laterality: N/A;  . TONSILLECTOMY AND ADENOIDECTOMY    . TUBAL LIGATION      SOCIAL HISTORY:   Social History   Tobacco Use  . Smoking status: Former Smoker    Packs/day: 1.00    Years: 20.00    Pack years: 20.00    Types: Cigarettes    Last attempt to quit: 02/03/2016    Years since quitting: 1.1  . Smokeless tobacco: Never Used  Substance Use Topics  . Alcohol use: No    Alcohol/week: 0.0 oz    FAMILY HISTORY:   Family History  Problem Relation Age of Onset  . COPD Mother   . Diabetes Mother   . Heart failure Mother   . Alcohol abuse Father   . Kidney disease Father   . Kidney failure Father   . Arthritis Sister   . CAD Maternal Grandmother   . Stroke Maternal Grandfather   . Arthritis Sister   . Mental illness Sister   . Arthritis Brother     DRUG ALLERGIES:  No Known Allergies  REVIEW OF SYSTEMS:  CONSTITUTIONAL: No fever,  reporting generalized weakness.  EYES: No blurred or double vision.  EARS, NOSE, AND THROAT: No tinnitus or ear pain.  RESPIRATORY: No cough, shortness  of breath, wheezing or hemoptysis.  CARDIOVASCULAR: No chest pain, orthopnea, edema.  GASTROINTESTINAL: No nausea, vomiting, diarrhea or abdominal pain.  GENITOURINARY: No dysuria, hematuria.  ENDOCRINE: No polyuria, nocturia,  HEMATOLOGY: No anemia, easy bruising or bleeding SKIN: No rash or lesion. Reporting redness and swelling of the both lower extremities MUSCULOSKELETAL: Chronic multiple sclerosis  NEUROLOGIC: No tingling, numbness, weakness.  PSYCHIATRY: No anxiety or depression.   MEDICATIONS AT HOME:   Prior to Admission medications   Medication Sig Start Date End Date Taking? Authorizing Provider  BIOTIN PO Take by mouth daily. Pt taking 500 mcg daily   Yes [provider]  buPROPion (WELLBUTRIN XL) 300 MG 24 hr tablet Take 300 mg by mouth  every morning.  05/28/16  Yes [provider]  cephALEXin (KEFLEX) 500 MG capsule Take 1 capsule (500 mg total) by mouth 4 (four) times daily for 10 days. 03/31/17 04/10/17 Yes Darel Hong, MD  clonazePAM (KLONOPIN) 1 MG tablet Take 1 tablet (1 mg total) by mouth at bedtime. 01/06/17  Yes Sudini, Alveta Heimlich, MD  DULoxetine (CYMBALTA) 60 MG capsule Take 1 capsule (60 mg total) by mouth every morning. 10/14/16  Yes Vaughan Basta, MD  gabapentin (NEURONTIN) 600 MG tablet Take 1 tablet (600 mg total) by mouth 3 (three) times daily. 02/09/17  Yes Mody, Ulice Bold, MD  hydrochlorothiazide (MICROZIDE) 12.5 MG capsule Take 12.5 mg by mouth daily. 12/19/16  Yes [provider]  interferon beta-1a (AVONEX) 30 MCG/0.5ML PSKT injection Inject 30 mcg into the muscle every Monday.   Yes [provider]  lisinopril (PRINIVIL,ZESTRIL) 5 MG tablet Take 1 tablet (5 mg total) by mouth daily. 11/13/16  Yes Lada, Satira Anis, MD  methylphenidate (RITALIN) 10 MG tablet Take 1 tablet (10 mg total) by mouth 2 (two) times daily. 10/14/16  Yes Vaughan Basta, MD  Multiple Vitamin (MULTIVITAMIN WITH MINERALS) TABS tablet Take 1 tablet by mouth daily.   Yes [provider]  mupirocin ointment (BACTROBAN) 2 % Apply 1 application topically 2 (two) times daily. 03/25/17  Yes Lada, Satira Anis, MD  potassium chloride (K-DUR) 10 MEQ tablet TAKE ONE (1) TABLET EACH DAY 01/17/15  Yes [provider]  QUEtiapine Fumarate (SEROQUEL XR) 150 MG 24 hr tablet Take 150 mg by mouth at bedtime.    Yes [provider]  solifenacin (VESICARE) 10 MG tablet Take 10 mg by mouth daily.    Yes [provider]  sulfamethoxazole-trimethoprim (BACTRIM DS,SEPTRA DS) 800-160 MG tablet Take 1 tablet by mouth 2 (two) times daily for 10 days. 03/31/17 04/10/17 Yes Darel Hong, MD  tiZANidine (ZANAFLEX) 4 MG tablet Take 1 tablet (4 mg total) by mouth 4 (four) times daily. TAKES FOR MS 10/14/16   Yes Vaughan Basta, MD  topiramate (TOPAMAX) 50 MG tablet Take 1 tablet (50 mg total) by mouth daily. 10/14/16  Yes Vaughan Basta, MD  vitamin C (ASCORBIC ACID) 500 MG tablet Take 500 mg by mouth daily.   Yes [provider]  clindamycin (CLEOCIN) 300 MG capsule Take 1 capsule (300 mg total) by mouth every 6 (six) hours. Patient not taking: Reported on 04/01/2017 02/17/17   Fritzi Mandes, MD  Diapers & Supplies (HUGGIES PULL-UPS) MISC 1 each by Does not apply route 2 (two) times daily. Dx urinary incontinence, MS; LON 99 months 02/03/17   Arnetha Courser, MD  fluticasone furoate-vilanterol (BREO ELLIPTA) 100-25 MCG/INH AEPB Inhale 1 puff into the lungs daily. Patient not taking: Reported on 04/01/2017 01/05/16   Enid Derry  P, MD  lidocaine-prilocaine (EMLA) cream Apply 1 application topically as needed. apply cream over the port site and then place small piece of saran wrap over the cream to protect your clothing 11/07/16   Sindy Guadeloupe, MD  naproxen (NAPROSYN) 500 MG tablet Take 500 mg by mouth.     [provider]  nystatin cream (MYCOSTATIN) Apply topically daily. 02/09/17   Bettey Costa, MD  oxyCODONE-acetaminophen (PERCOCET/ROXICET) 5-325 MG tablet Take by mouth. 01/10/15   [provider]  polyethylene glycol powder (GLYCOLAX/MIRALAX) powder Take 17 g by mouth daily as needed for mild constipation. Mixed in water 01/06/17   Sudini, Alveta Heimlich, MD  senna-docusate (SENOKOT-S) 8.6-50 MG tablet Take 1 tablet by mouth daily as needed for mild constipation.     [provider]      VITAL SIGNS:  Blood pressure 120/70, pulse 85, temperature 98.3 F (36.8 C), temperature source Oral, resp. rate 13, height 5\' 4"  (1.626 m), weight 127 kg (280 lb), SpO2 95 %.  PHYSICAL EXAMINATION:  GENERAL:  67 y.o.-year-old patient lying in the bed with no acute distress.  EYES: Pupils equal, round, reactive to light and accommodation. No scleral icterus. Extraocular  muscles intact.  HEENT: Head atraumatic, normocephalic. Oropharynx and nasopharynx clear.  NECK:  Supple, no jugular venous distention. No thyroid enlargement, no tenderness.  LUNGS: Normal breath sounds bilaterally, no wheezing, rales,rhonchi or crepitation. No use of accessory muscles of respiration.  CARDIOVASCULAR: S1, S2 normal. No murmurs, rubs, or gallops.  ABDOMEN: Soft, nontender, nondistended. Bowel sounds present. No organomegaly or mass.  EXTREMITIES: Bilateral lower extremity is erythematous, edematous and tender No  cyanosis, or clubbing. Chronic indwelling Foley catheter NEUROLOGIC: Cranial nerves II through XII are intact. Muscle strength diffusely diminished in lower  extremities.Sensation intact. Gait not checked.  PSYCHIATRIC: The patient is alert and oriented x 3.  SKIN: No obvious rash, lesion, or ulcer.   LABORATORY PANEL:   CBC Recent Labs  Lab 04/01/17 1314  WBC 14.8*  HGB 12.2  HCT 36.6  PLT 242   ------------------------------------------------------------------------------------------------------------------  Chemistries  Recent Labs  Lab 04/01/17 1314  NA 131*  K 3.9  CL 95*  CO2 23  GLUCOSE 119*  BUN 21*  CREATININE 1.76*  CALCIUM 9.9  AST 28  ALT 24  ALKPHOS 78  BILITOT 0.6   ------------------------------------------------------------------------------------------------------------------  Cardiac Enzymes Recent Labs  Lab 04/01/17 1314  TROPONINI 0.04*   ------------------------------------------------------------------------------------------------------------------  RADIOLOGY:  Dg Chest 2 View  Result Date: 04/01/2017 CLINICAL DATA:  [address] [address] while walking to kitchen. EXAM: CHEST  2 VIEW COMPARISON:  Chest radiograph February 06, 2017 FINDINGS: Cardiomediastinal silhouette is unremarkable. Similar bronchitic changes without pleural effusion or focal consolidation. No pneumothorax. Single lumen LEFT chest Port-A-Cath has is similarly  backed out, looped in the distribution of the internal jugular vein with distal tip projecting at brachiocephalic confluence. Soft tissue planes and included osseous structures are unchanged. Similar advanced cervical facet arthropathy. IMPRESSION: Mild bronchitic changes. Similarly positioned LEFT chest Port-A-Cath with distal tip projecting at brachiocephalic confluence. Electronically Signed   By: Elon Alas M.D.   On: 04/01/2017 14:14   Ct Head Wo Contrast  Result Date: 04/01/2017 CLINICAL DATA:  Loss of balance and fall today. History of multiple sclerosis. EXAM: CT HEAD WITHOUT CONTRAST TECHNIQUE: Contiguous axial images were obtained from the base of the skull through the vertex without intravenous contrast. COMPARISON:  Head CT scan 02/06/2017. FINDINGS: Brain: Cortical atrophy and hypoattenuation in the deep white matter structures are  again seen. Remote lacunar infarction in the left cerebellum is also unchanged. No evidence of acute abnormality including hemorrhage, infarct, mass lesion, mass effect, midline shift or abnormal extra-axial fluid collection. No hydrocephalus or pneumocephalus. Vascular: Atherosclerosis noted. Skull: Intact. Sinuses/Orbits: Negative. Other: None. IMPRESSION: No acute intracranial abnormality. No change in atrophy and chronic white matter disease. Electronically Signed   By: Inge Rise M.D.   On: 04/01/2017 13:58    EKG:   Orders placed or performed during the hospital encounter of 04/01/17  . ED EKG  . ED EKG    IMPRESSION AND PLAN:   [name]  is a 66 y.o. female with a known history of chronic multiple sclerosis, essential hypertension, COPD, chronic indwelling Foley catheter is presenting to the ED after she had multiple falls. CT head is negative. Patient reports that she is losing balance as her legs are swollen, red and painful.   #Bilateral  lower extremity cellulitis-left greater than right Admitted to MedSurg unit Failed  outpatient antibiotics Keflex Started patient on vancomycin Supportive treatment Bilateral lower extremity venous Dopplers to rule out DVT  #Acute cystitis with abnormal urinalysis-chronic indwelling Foley catheter Urine culture and sensitivity IV meropenem IV fluids Change Foley catheter  #Acute kidney injury with poor by mouth intake IV fluids Hold hydrochlorothiazide Avoid nephrotoxins  #Hyponatremia from poor by mouth intake IV fluids and hold hydrochlorothiazide  #Frequent falls PT evaluation  #Chronic history of multiple sclerosis  #History of B-cell lymphoma status post radiation therapy  outpatient follow-up with oncology   DVT prophylaxis with Lovenox  All the records are reviewed and case discussed with ED provider. Management plans discussed with the patient, family and they are in agreement.  CODE STATUS: fc, healthcare power of attorney friend [name]  TOTAL TIME TAKING CARE OF THIS PATIENT: 20minutes.   Note: This dictation was prepared with Dragon dictation along with smaller phrase technology. Any transcriptional errors that result from this process are unintentional.  Nicholes Mango M.D on 04/01/2017 at 3:54 PM  Between 7am to 6pm - Pager - [phone]  After 6pm go to [email or web address] - password [email or web address] [facility]  Office  [phone]  CC: Primary care physician; Arnetha Courser, MD

## 2017-04-01 NOTE — Progress Notes (Signed)
Reported to Dr Margaretmary Eddy that patient has troponin level of 0.09. MD acknowledged, no new orders.

## 2017-04-01 NOTE — ED Triage Notes (Signed)
Pt arrived via EMS from home d/t fall while walking to the kitchen. EMS states family reported the pt had a moment of LOC but pt denies any LOC. Pt reports she loss balance and fell but denies any injuries. Pt has hx of MS and is currently being treated with antibiotics for cellulitis at home.

## 2017-04-01 NOTE — Progress Notes (Signed)

## 2017-04-01 NOTE — Consult Note (Signed)
Pharmacy Antibiotic Note  [name] is a 66 y.o. female admitted on 04/01/2017 with cellulitis and UTI.  Pharmacy has been consulted for vancomycin and meropenem dosing. Pt has a hx of UTI- in sept her Ucx grew pan sensitive pseudomonas. Pt has been being treated for cellulitis. First with topical bactroban then with keflex and bactrim ( keflex since 11/30, bactrim since yesterday with worsening)  Plan: Vancomycin 1000mg  once. Give next dose in 6 hours for stacked dosing Vancomycin 1250 IV every 24 hours.  Goal trough 10-15 mcg/mL.  Trough prior to the 5th dose Meropenem 1g q 12hr  Height: 5\' 4"  (162.6 cm) Weight: 280 lb (127 kg) IBW/kg (Calculated) : 54.7  Temp (24hrs), Avg:98.3 F (36.8 C), Min:98.3 F (36.8 C), Max:98.3 F (36.8 C)  Recent Labs  Lab 03/31/17 1535 04/01/17 1314  WBC 5.1 14.8*  CREATININE 1.19* 1.76*    Estimated Creatinine Clearance: 41.5 mL/min (A) (by C-G formula based on SCr of 1.76 mg/dL (H)).    No Known Allergies  Antimicrobials this admission: vancomycin 12/4 >>  meropenem 12/4 >>   Dose adjustments this admission:   Microbiology results: 12/4 UCx:     Thank you for allowing pharmacy to be a part of this patient's care.  Ramond Dial, Pharm.D, BCPS Clinical Pharmacist 04/01/2017 3:45 PM

## 2017-04-01 NOTE — Telephone Encounter (Signed)
Thank you so much for this information I just reviewed her hospital admission note

## 2017-04-01 NOTE — Telephone Encounter (Signed)
Copied from [place] [phone]. Topic: General - Other >> Apr 01, 2017  3:44 PM Scherrie Gerlach wrote: Reason for CRM: Sela Hua with [facility] home health calling to report a missed PT visit today. He spoke with caregiver, who advised pt fell 2 times this am. Pt taken to the ED Pt was admitted to [facility] for high white blood cell count today

## 2017-04-01 NOTE — ED Notes (Signed)
Pt sat of 87% oxygen on RA so this RN placed pt on 2L of oxygen via nasal canula. Pt is now 100% oxygen.

## 2017-04-01 NOTE — ED Provider Notes (Addendum)
[facility] Emergency Department Provider Note       Time seen: ----------------------------------------- 1:00 PM on 04/01/2017 -----------------------------------------   I have reviewed the triage vital signs and the nursing notes.  HISTORY   Chief Complaint Fall    HPI [name] is a 66 y.o. female with a history of COPD, multiple sclerosis, hypertension who presents to the ED for a fall that occurred while she was at home.  EMS states family reported that she had a moment of loss of consciousness but she denies this.  Patient reports she lost her balance and fell and has had that happen multiple times in the past.  She has a history of multiple sclerosis and currently she is on antibiotics for cellulitis of her left leg.  Past Medical History:  Diagnosis Date  . Abdominal aortic atherosclerosis ([place]) 11/11/2016  . ADHD   . Anxiety   . COPD (chronic obstructive pulmonary disease) ([place])   . Depression    major depressive  . Dyspnea    doe  . Edema    left leg  . Follicular lymphoma ([place])    B Cell  . Follicular lymphoma grade II ([place])   . Hypertension   . Hypotension    idiopathic  . Kyphoscoliosis and scoliosis 11/26/2011  . Morbid obesity ([place]) 01/05/2016  . Multiple sclerosis ([place])   . Multiple sclerosis ([place])    1980's  . Neuromuscular disorder ([place])   . Obstructive and reflux uropathy    foley  . Pain    atypical facial  . Peripheral vascular disease of lower extremity with ulceration ([place]) 11/08/2015  . Skin ulcer ([place]) 11/08/2015  . Weakness    generalized. has MS    Patient Active Problem List   Diagnosis Date Noted  . Sepsis ([place]) 02/06/2017  . Foot pain, bilateral 02/03/2017  . Tinea pedis 02/03/2017  . Lymphoma ([place]) 01/07/2017  . Pseudomonas urinary tract infection 01/02/2017  . Cellulitis and abscess of leg 12/29/2016  . Left ovarian cyst 12/09/2016  . Abdominal aortic atherosclerosis ([place]) 11/11/2016  . Cellulitis  of left lower extremity 10/11/2016  . Swelling of lower extremity 10/11/2016  . Obstructive sleep apnea 10/11/2016  . Bilateral lower leg cellulitis 10/11/2016  . Follicular lymphoma of intra-abdominal lymph nodes ([place]) 08/06/2016  . Multiple falls 06/07/2016  . Inguinal adenopathy 05/29/2016  . Breast cancer screening 01/05/2016  . Obesity, morbid ([place]) 01/05/2016  . Pressure ulcer 12/19/2015  . Low HDL (under 40) 12/19/2015  . Cellulitis 12/18/2015  . Skin ulcer ([place]) 11/08/2015  . Peripheral vascular disease of lower extremity with ulceration ([place]) 11/08/2015  . CKD (chronic kidney disease) stage 3, GFR 30-59 ml/min (HCC) 11/08/2015  . Decubitus ulcer of left thigh, stage 2 04/18/2015  . Constipation due to pain medication 01/31/2015  . Obstructive sleep apnea of adult 01/13/2015  . Pelvic muscle wasting 01/13/2015  . Incomplete bladder emptying 01/13/2015  . Headache, migraine 10/24/2014  . 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 Current tobacco use 10/24/2014  . Lumbar radiculopathy, chronic 10/02/2013  . COPD with bronchial hyperresponsiveness ([place]) 10/02/2013  . Major depressive disorder, recurrent, in partial remission ([place]) 10/02/2013  . Hypertension goal BP (blood pressure) < 140/90 10/02/2013  . Multiple sclerosis ([place]) 10/02/2013  . Major depression in remission ([place]) 10/02/2013  . Absence of bladder continence 09/25/2012  . Acontractile bladder 02/12/2012  . Bladder retention 02/12/2012  . Narrowing of intervertebral disc space 11/26/2011  . Kyphoscoliosis and scoliosis 11/26/2011  Past Surgical History:  Procedure Laterality Date  . BACK SURGERY N/A 2002  . CYST EXCISION     lower back  . INGUINAL LYMPH NODE BIOPSY Left 07/04/2016   Procedure: INGUINAL LYMPH NODE BIOPSY;  Surgeon: Christene Lye, MD;  Location: [facility] ORS;  Service: General;  Laterality: Left;  . PORTACATH PLACEMENT N/A 07/22/2016   Procedure: INSERTION PORT-A-CATH;  Surgeon: Christene Lye, MD;  Location: [facility] ORS;  Service: General;  Laterality: N/A;  . TONSILLECTOMY AND ADENOIDECTOMY    . TUBAL LIGATION      Allergies Patient has no known allergies.  Social History Social History   Tobacco Use  . Smoking status: Former Smoker    Packs/day: 1.00    Years: 20.00    Pack years: 20.00    Types: Cigarettes    Last attempt to quit: 02/03/2016    Years since quitting: 1.1  . Smokeless tobacco: Never Used  Substance Use Topics  . Alcohol use: No    Alcohol/week: 0.0 oz  . Drug use: Yes    Types: Marijuana    Comment: smokes THC daily per pt     Review of Systems Constitutional: Negative for fever. Cardiovascular: Negative for chest pain. Respiratory: Negative for shortness of breath. Gastrointestinal: Negative for abdominal pain, vomiting and diarrhea. Genitourinary: Negative for dysuria. Musculoskeletal: Negative for back pain. Skin: Positive for left leg erythema Neurological: Positive for headache  All systems negative/normal/unremarkable except as stated in the HPI  ____________________________________________   PHYSICAL EXAM:  VITAL SIGNS: ED Triage Vitals [04/01/17 1255]  Enc Vitals Group     BP      Pulse      Resp      Temp      Temp src      SpO2 100 %     Weight      Height      Head Circumference      Peak Flow      Pain Score 5     Pain Loc      Pain Edu?      Excl. in [place]?    Constitutional: Alert and oriented.  Chronically ill-appearing, no distress Eyes: Conjunctivae are normal. Normal extraocular movements. ENT   Head: Normocephalic and atraumatic.   Nose: No congestion/rhinnorhea.   Mouth/Throat: Mucous membranes are moist.   Neck: No stridor. Cardiovascular: Normal rate, regular rhythm. No murmurs, rubs, or gallops. Respiratory: Normal respiratory effort without tachypnea nor retractions. Breath sounds are clear and equal bilaterally. No wheezes/rales/rhonchi. Gastrointestinal: Soft and nontender.  Normal bowel sounds Musculoskeletal: Nontender with normal range of motion in extremities. No lower extremity tenderness nor edema. Neurologic:  Normal speech and language. No gross focal neurologic deficits are appreciated.  Skin: Erythema in the left lower extremity below the knee Psychiatric: Mood and affect are normal. Speech and behavior are normal.  ____________________________________________  EKG: Interpreted by me.  Sinus rhythm rate 88 bpm, prolonged PR interval, possible old inferior infarct.  Normal QT.  ____________________________________________  ED COURSE:  Pertinent labs & imaging results that were available during my care of the patient were reviewed by me and considered in my medical decision making (see chart for details). Patient presents for a fall that occurred earlier, we will assess with labs and imaging as indicated.   Procedures ____________________________________________   LABS (pertinent positives/negatives)  Labs Reviewed  CBC WITH DIFFERENTIAL/PLATELET - Abnormal; Notable for the following components:      Result Value   WBC  14.8 (*)    RDW 17.2 (*)    Neutro Abs 14.1 (*)    Lymphs Abs 0.1 (*)    All other components within normal limits  COMPREHENSIVE METABOLIC PANEL - Abnormal; Notable for the following components:   Sodium 131 (*)    Chloride 95 (*)    Glucose, Bld 119 (*)    BUN 21 (*)    Creatinine, Ser 1.76 (*)    GFR calc non Af Amer 29 (*)    GFR calc Af Amer 34 (*)    All other components within normal limits  TROPONIN I - Abnormal; Notable for the following components:   Troponin I 0.04 (*)    All other components within normal limits  URINALYSIS, COMPLETE (UACMP) WITH MICROSCOPIC  CBG MONITORING, ED    RADIOLOGY Images were viewed by me  CT head IMPRESSION: Mild bronchitic changes.  Similarly positioned LEFT chest Port-A-Cath with distal tip projecting at brachiocephalic confluence. IMPRESSION: No acute intracranial  abnormality.  No change in atrophy and chronic white matter disease. ____________________________________________  DIFFERENTIAL DIAGNOSIS   Hypotension, occult infection, cellulitis, dehydration, anemia, electrolyte abnormality, medication side effect  FINAL ASSESSMENT AND PLAN  Fall, minor head injury, weakness, cellulitis, Cystitis   Plan: Patient had presented for a fall that occurred at home. Patient's labs do reveal an elevated white count likely stemming from her cellulitis. Patient's imaging was negative for any acute process.  Patient was here yesterday and despite oral doses of antibiotics including Keflex and Bactrim her cellulitis on her left leg appears to be worsening as well as a urinary tract infection.  I will order IV antibiotics and discussed with the hospitalist for admission.  She may also be having issues related to MS and may need a physical therapy evaluation.   Earleen Newport, MD   Note: This note was generated in part or whole with voice recognition software. Voice recognition is usually quite accurate but there are transcription errors that can and very often do occur. I apologize for any typographical errors that were not detected and corrected.     Earleen Newport, MD 04/01/17 1446    Earleen Newport, MD 04/01/17 [phone]

## 2017-04-01 NOTE — ED Notes (Signed)
Dr. Jimmye Norman made aware of critical troponin lab value of 0.04

## 2017-04-02 ENCOUNTER — Inpatient Hospital Stay: Payer: Medicare Other

## 2017-04-02 LAB — CK: Total CK: 156 U/L (ref 38–234)

## 2017-04-02 LAB — CREATININE, SERUM
Creatinine, Ser: 2.39 mg/dL — ABNORMAL HIGH (ref 0.44–1.00)
GFR calc Af Amer: 23 mL/min — ABNORMAL LOW (ref >60)
GFR calc non Af Amer: 20 mL/min — ABNORMAL LOW (ref >60)

## 2017-04-02 LAB — TROPONIN I
Troponin I: 0.11 ng/mL (ref <0.03)
Troponin I: 0.1 ng/mL (ref <0.03)

## 2017-04-02 MED ORDER — HEPARIN SODIUM (PORCINE) 5000 UNIT/ML IJ SOLN
5000.0000 [IU] | Freq: Three times a day (TID) | INTRAMUSCULAR | Status: DC
  Administered 2017-04-02 – 2017-04-05 (×10): 5000 [IU] via SUBCUTANEOUS
  Filled 2017-04-02 (×10): qty 1

## 2017-04-02 MED ORDER — SODIUM CHLORIDE 0.9 % IV BOLUS (SEPSIS)
500.0000 mL | Freq: Once | INTRAVENOUS | Status: AC
  Administered 2017-04-02: 500 mL via INTRAVENOUS

## 2017-04-02 MED ORDER — ALBUTEROL SULFATE (2.5 MG/3ML) 0.083% IN NEBU: 2.5000 mg | INHALATION_SOLUTION | RESPIRATORY_TRACT | Status: DC | PRN

## 2017-04-02 MED ORDER — DEXTROSE 5 % IV SOLN
500.0000 mg | Freq: Two times a day (BID) | INTRAVENOUS | Status: DC
  Administered 2017-04-02 – 2017-04-03 (×2): 500 mg via INTRAVENOUS
  Filled 2017-04-02 (×4): qty 5

## 2017-04-02 MED ORDER — SODIUM CHLORIDE 0.9 % IV BOLUS (SEPSIS)
1000.0000 mL | Freq: Once | INTRAVENOUS | Status: AC
  Administered 2017-04-02: 1000 mL via INTRAVENOUS

## 2017-04-02 NOTE — Progress Notes (Signed)
Pharmacy Antibiotic Note  [name] is a 66 y.o. female admitted on 04/01/2017 with cellulitis.  Pharmacy has been consulted for cefazolin dosing.  Plan: cefazolin 500mg  iv q12h   Height: 5\' 4"  (162.6 cm) Weight: 280 lb (127 kg) IBW/kg (Calculated) : 54.7  Temp (24hrs), Avg:99.5 F (37.5 C), Min:97.6 F (36.4 C), Max:102.6 F (39.2 C)  Recent Labs  Lab 03/31/17 1535 04/01/17 1314 04/02/17 1108  WBC 5.1 14.8*  --   CREATININE 1.19* 1.76* 2.39*    Estimated Creatinine Clearance: 30.6 mL/min (A) (by C-G formula based on SCr of 2.39 mg/dL (H)).    No Known Allergies  Antimicrobials this admission: Anti-infectives (From admission, onward)   Start     Dose/Rate Route Frequency Ordered Stop   04/02/17 1530  ceFAZolin (ANCEF) 500 mg in dextrose 5 % 100 mL IVPB     500 mg 210 mL/hr over 30 Minutes Intravenous Every 12 hours 04/02/17 1524     04/01/17 2200  sulfamethoxazole-trimethoprim (BACTRIM DS,SEPTRA DS) 800-160 MG per tablet 1 tablet  Status:  Discontinued     1 tablet Oral 2 times daily 04/01/17 1644 04/01/17 1644   04/01/17 2100  vancomycin (VANCOCIN) 1,250 mg in sodium chloride 0.9 % 250 mL IVPB  Status:  Discontinued     1,250 mg 166.7 mL/hr over 90 Minutes Intravenous Every 24 hours 04/01/17 1706 04/02/17 1510   04/01/17 1545  meropenem (MERREM) 1 g in sodium chloride 0.9 % 100 mL IVPB  Status:  Discontinued     1 g 200 mL/hr over 30 Minutes Intravenous Every 12 hours 04/01/17 1534 04/02/17 1514   04/01/17 1445  vancomycin (VANCOCIN) IVPB 1000 mg/200 mL premix     1,000 mg 200 mL/hr over 60 Minutes Intravenous  Once 04/01/17 1438 04/01/17 1629       Dose adjustments this admission: Due to renal impairment, ancef dosing reduced, crcl 31 (Recommendation to administer 50% of usual dose every 12 hours)    Microbiology results: Recent Results (from the past 240 hour(s))  CULTURE, BLOOD (ROUTINE X 2) w Reflex to ID Panel     Status: None (Preliminary result)   Collection Time: 04/01/17 10:42 PM  Result Value Ref Range Status   Specimen Description BLOOD BLOOD RIGHT HAND  Final   Special Requests   Final    BOTTLES DRAWN AEROBIC AND ANAEROBIC Blood Culture results may not be optimal due to an excessive volume of blood received in culture bottles   Culture NO GROWTH < 12 HOURS  Final   Report Status PENDING  Incomplete  CULTURE, BLOOD (ROUTINE X 2) w Reflex to ID Panel     Status: None (Preliminary result)   Collection Time: 04/01/17 10:54 PM  Result Value Ref Range Status   Specimen Description BLOOD BLOOD LEFT HAND  Final   Special Requests   Final    BOTTLES DRAWN AEROBIC AND ANAEROBIC Blood Culture results may not be optimal due to an excessive volume of blood received in culture bottles   Culture NO GROWTH < 12 HOURS  Final   Report Status PENDING  Incomplete     Thank you for allowing pharmacy to be a part of this patient's care.  Donna Christen Castella Lerner 04/02/2017 3:25 PM

## 2017-04-02 NOTE — NC FL2 (Addendum)
[place] LEVEL OF CARE SCREENING TOOL     IDENTIFICATION  Patient Name: [name] Birthdate: 11-02-1950 Sex: female Admission Date (Current Location): 04/01/2017  [place] and [place] Number:  Engineering geologist and Address:  [facility], [address] [place], [place] [address]      Provider Number: [id number]  Attending Physician Name and Address:  Nicholes Mango, MD Relative Name and Phone Number:     Giul[place]ta, Prokop [phone]   Malachy Moan   [phone]      Current Level of Care: Hospital Recommended Level of Care: [place] Prior Approval Number:    Date Approved/Denied:   PASRR Number:  [id number]   Discharge Plan: SNF    Current Diagnoses: Patient Active Problem List   Diagnosis Date Noted  . Sepsis ([place]) 02/06/2017  . Foot pain, bilateral 02/03/2017  . Tinea pedis 02/03/2017  . Lymphoma ([place]) 01/07/2017  . Pseudomonas urinary tract infection 01/02/2017  . Cellulitis and abscess of leg 12/29/2016  . Left ovarian cyst 12/09/2016  . Abdominal aortic atherosclerosis ([place]) 11/11/2016  . Cellulitis of left lower extremity 10/11/2016  . Swelling of lower extremity 10/11/2016  . Obstructive sleep apnea 10/11/2016  . Bilateral lower leg cellulitis 10/11/2016  . Follicular lymphoma of intra-abdominal lymph nodes ([place]) 08/06/2016  . Multiple falls 06/07/2016  . Inguinal adenopathy 05/29/2016  . Breast cancer screening 01/05/2016  . Obesity, morbid ([place]) 01/05/2016  . Pressure ulcer 12/19/2015  . Low HDL (under 40) 12/19/2015  . Cellulitis 12/18/2015  . Skin ulcer ([place]) 11/08/2015  . Peripheral vascular disease of lower extremity with ulceration ([place]) 11/08/2015  . CKD (chronic kidney disease) stage 3, GFR 30-59 ml/min (HCC) 11/08/2015  . Decubitus ulcer of left thigh, stage 2 04/18/2015  . Constipation due to pain medication 01/31/2015  . Obstructive sleep apnea of adult 01/13/2015   . Pelvic muscle wasting 01/13/2015  . Incomplete bladder emptying 01/13/2015  . Headache, migraine 10/24/2014  . 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 Current tobacco use 10/24/2014  . Lumbar radiculopathy, chronic 10/02/2013  . COPD with bronchial hyperresponsiveness ([place]) 10/02/2013  . Major depressive disorder, recurrent, in partial remission ([place]) 10/02/2013  . Hypertension goal BP (blood pressure) < 140/90 10/02/2013  . Multiple sclerosis ([place]) 10/02/2013  . Major depression in remission ([place]) 10/02/2013  . Absence of bladder continence 09/25/2012  . Acontractile bladder 02/12/2012  . Bladder retention 02/12/2012  . Narrowing of intervertebral disc space 11/26/2011  . Kyphoscoliosis and scoliosis 11/26/2011    Orientation RESPIRATION BLADDER Height & Weight     Self, Time, Situation, Place  Normal Continent Weight: 280 lb (127 kg) Height:  5\' 4"  (162.6 cm)  BEHAVIORAL SYMPTOMS/MOOD NEUROLOGICAL BOWEL NUTRITION STATUS      Continent Diet(DietL Heart Healthy )  AMBULATORY STATUS COMMUNICATION OF NEEDS Skin   Limted Assistance Verbally Normal                       Personal Care Assistance Level of Assistance  Bathing, Feeding, Dressing Bathing Assistance: Limited assistance Feeding assistance: Independent Dressing Assistance: Limited assistance     Functional Limitations Info  Sight, Hearing, Speech Sight Info: Adequate Hearing Info: Adequate Speech Info: Adequate    SPECIAL CARE FACTORS FREQUENCY  PT (By licensed PT), OT (By licensed OT)     PT Frequency: (5) OT Frequency: (5)            Contractures      Additional Factors  Info  Code Status, Allergies Code Status Info: (Full Code. ) Allergies Info: (No Known Allergies. )           Current Medications (04/02/2017):  This is the current hospital active medication list Current Facility-Administered Medications  Medication Dose Route Frequency Provider Last Rate Last Dose  . acetaminophen  (TYLENOL) tablet 650 mg  650 mg Oral Q6H PRN Gouru, Aruna, MD   650 mg at 04/01/17 2020  . buPROPion (WELLBUTRIN XL) 24 hr tablet 300 mg  300 mg Oral Archie Balboa, Illene Silver, MD   300 mg at 04/02/17 4627  . clonazePAM (KLONOPIN) tablet 1 mg  1 mg Oral QHS Gouru, Aruna, MD   1 mg at 04/01/17 2201  . darifenacin (ENABLEX) 24 hr tablet 7.5 mg  7.5 mg Oral Daily Gouru, Aruna, MD   7.5 mg at 04/02/17 0959  . DULoxetine (CYMBALTA) DR capsule 60 mg  60 mg Oral Prudence Davidson, MD   60 mg at 04/02/17 0648  . fluticasone furoate-vilanterol (BREO ELLIPTA) 100-25 MCG/INH 1 puff  1 puff Inhalation Daily Gouru, Aruna, MD   1 puff at 04/02/17 0959  . gabapentin (NEURONTIN) tablet 600 mg  600 mg Oral TID Nicholes Mango, MD   600 mg at 04/02/17 0959  . [START ON 04/07/2017] interferon beta-1a (AVONEX) injection 30 mcg  30 mcg Intramuscular Q Mon Gouru, Aruna, MD      . meropenem (MERREM) 1 g in sodium chloride 0.9 % 100 mL IVPB  1 g Intravenous Q12H Ramond Dial, RPH   Stopped at 04/02/17 0500  . methylphenidate (RITALIN) tablet 10 mg  10 mg Oral BID Nicholes Mango, MD   10 mg at 04/02/17 0733  . multivitamin with minerals tablet 1 tablet  1 tablet Oral Daily Gouru, Aruna, MD   1 tablet at 04/02/17 0959  . mupirocin ointment (BACTROBAN) 2 % 1 application  1 application Topical BID Nicholes Mango, MD   1 application at 03/50/09 2209  . nystatin cream (MYCOSTATIN)   Topical Daily PRN Gouru, Aruna, MD      . oxyCODONE-acetaminophen (PERCOCET/ROXICET) 5-325 MG per tablet 1 tablet  1 tablet Oral Q6H PRN Gouru, Aruna, MD      . polyethylene glycol (MIRALAX / GLYCOLAX) packet 17 g  17 g Oral Daily PRN Gouru, Aruna, MD      . potassium chloride (K-DUR,KLOR-CON) CR tablet 10 mEq  10 mEq Oral Daily Gouru, Aruna, MD   10 mEq at 04/02/17 1000  . QUEtiapine (SEROQUEL XR) 24 hr tablet 150 mg  150 mg Oral QHS Gouru, Aruna, MD   150 mg at 04/01/17 2201  . senna-docusate (Senokot-S) tablet 1 tablet  1 tablet Oral Daily PRN Gouru,  Aruna, MD      . tiZANidine (ZANAFLEX) tablet 4 mg  4 mg Oral QID Gouru, Aruna, MD   4 mg at 04/02/17 1000  . topiramate (TOPAMAX) tablet 50 mg  50 mg Oral Daily Gouru, Aruna, MD   50 mg at 04/02/17 1001  . vancomycin (VANCOCIN) 1,250 mg in sodium chloride 0.9 % 250 mL IVPB  1,250 mg Intravenous Q24H Ramond Dial, RPH   Stopped at 04/02/17 0100  . vitamin C (ASCORBIC ACID) tablet 500 mg  500 mg Oral Daily Gouru, Aruna, MD   500 mg at 04/02/17 1001   Facility-Administered Medications Ordered in Other Encounters  Medication Dose Route Frequency Provider Last Rate Last Dose  . heparin lock flush 100 unit/mL  500 Units Intracatheter Once PRN  Sindy Guadeloupe, MD         Discharge Medications: Please see discharge summary for a list of discharge medications.  Relevant Imaging Results:  Relevant Lab Results:   Additional Information (SSN: [id number])  Sample, Veronia Beets, LCSW

## 2017-04-02 NOTE — Progress Notes (Addendum)
2015  Pt with reported temp of 102.6.  Spoke with Dr Margaretmary Eddy and blood cultures, UA, and tylenol ordered.  0110 Notified Prime doc via text page regarding labile blood pressure. Dorna Bloom RN

## 2017-04-02 NOTE — Progress Notes (Signed)
MD notified of continued low BP. Orders received for 544ml bolus. Verbal orders given to call MD if systolic BP is below 90. Ammie Dalton, RN

## 2017-04-02 NOTE — Consult Note (Signed)
[facility] Infectious Disease     Reason for Consult: Cellulitis, UTI    Referring Physician: Boykin Reaper Date of Admission:  04/01/2017   Active Problems:   Cellulitis   HPI: [name] is a 66 y.o. female admitted with increased falling as well as leg swelling and redness. Had recently been on keflex and bactrim as otpt.  On admit wbc was 5.1->14 and temp up to 103. UA with TNTC WBC. UCX pendign. Cr elevated. Dopplers neg LE< CXR neg. Has portacath.    She has a hx of MS, Follicular B cell lymphoma, obesity, sleep apnea, chronic foley cath, HTN.  Had multiple admissions for LE cellulitis.    Cannot take cipro due to drug interactions.     Past Medical History:  Diagnosis Date  . Abdominal aortic atherosclerosis ([place]) 11/11/2016  . ADHD   . Anxiety   . COPD (chronic obstructive pulmonary disease) ([place])   . Depression    major depressive  . Dyspnea    doe  . Edema    left leg  . Follicular lymphoma ([place])    B Cell  . Follicular lymphoma grade II ([place])   . Hypertension   . Hypotension    idiopathic  . Kyphoscoliosis and scoliosis 11/26/2011  . Morbid obesity ([place]) 01/05/2016  . Multiple sclerosis ([place])   . Multiple sclerosis ([place])    1980's  . Neuromuscular disorder ([place])   . Obstructive and reflux uropathy    foley  . Pain    atypical facial  . Peripheral vascular disease of lower extremity with ulceration ([place]) 11/08/2015  . Skin ulcer ([place]) 11/08/2015  . Weakness    generalized. has MS   Past Surgical History:  Procedure Laterality Date  . BACK SURGERY N/A 2002  . CYST EXCISION     lower back  . INGUINAL LYMPH NODE BIOPSY Left 07/04/2016   Procedure: INGUINAL LYMPH NODE BIOPSY;  Surgeon: Christene Lye, MD;  Location: [facility] ORS;  Service: General;  Laterality: Left;  . PORTACATH PLACEMENT N/A 07/22/2016   Procedure: INSERTION PORT-A-CATH;  Surgeon: Christene Lye, MD;  Location: [facility] ORS;  Service: General;  Laterality: N/A;  . TONSILLECTOMY AND  ADENOIDECTOMY    . TUBAL LIGATION     Social History   Tobacco Use  . Smoking status: Former Smoker    Packs/day: 1.00    Years: 20.00    Pack years: 20.00    Types: Cigarettes    Last attempt to quit: 02/03/2016    Years since quitting: 1.1  . Smokeless tobacco: Never Used  Substance Use Topics  . Alcohol use: No    Alcohol/week: 0.0 oz  . Drug use: Yes    Types: Marijuana    Comment: smokes THC daily per pt    Family History  Problem Relation Age of Onset  . COPD Mother   . Diabetes Mother   . Heart failure Mother   . Alcohol abuse Father   . Kidney disease Father   . Kidney failure Father   . Arthritis Sister   . CAD Maternal Grandmother   . Stroke Maternal Grandfather   . Arthritis Sister   . Mental illness Sister   . Arthritis Brother     Allergies: No Known Allergies  Current antibiotics: Antibiotics Given (last 72 hours)    Date/Time Action Medication Dose Rate   04/01/17 1504 New Bag/Given   vancomycin (VANCOCIN) IVPB 1000 mg/200 mL premix 1,000 mg 200 mL/hr   04/01/17  1735 New Bag/Given   meropenem (MERREM) 1 g in sodium chloride 0.9 % 100 mL IVPB 1 g 200 mL/hr   04/01/17 2330 New Bag/Given   vancomycin (VANCOCIN) 1,250 mg in sodium chloride 0.9 % 250 mL IVPB 1,250 mg 166.7 mL/hr   04/02/17 0433 New Bag/Given   meropenem (MERREM) 1 g in sodium chloride 0.9 % 100 mL IVPB 1 g 200 mL/hr      MEDICATIONS: . buPROPion  300 mg Oral BH-q7a  . clonazePAM  1 mg Oral QHS  . darifenacin  7.5 mg Oral Daily  . DULoxetine  60 mg Oral BH-q7a  . fluticasone furoate-vilanterol  1 puff Inhalation Daily  . gabapentin  600 mg Oral TID  . heparin injection (subcutaneous)  5,000 Units Subcutaneous Q8H  . [START ON 04/07/2017] interferon beta-1a  30 mcg Intramuscular Q Mon  . methylphenidate  10 mg Oral BID  . multivitamin with minerals  1 tablet Oral Daily  . mupirocin ointment  1 application Topical BID  . potassium chloride  10 mEq Oral Daily  . QUEtiapine  Fumarate  150 mg Oral QHS  . tiZANidine  4 mg Oral QID  . topiramate  50 mg Oral Daily  . vitamin C  500 mg Oral Daily    Review of Systems - 11 systems reviewed and negative per HPI   OBJECTIVE: Temp:  [97.6 F (36.4 C)-102.6 F (39.2 C)] 97.6 F (36.4 C) (12/05 1306) Pulse Rate:  [45-158] 45 (12/05 1306) Resp:  [13-21] 20 (12/05 1306) BP: (67-145)/(36-119) 97/54 (12/05 1400) SpO2:  [76 %-100 %] 92 % (12/05 1306) Constitutional:  oriented to person, place, and time. appears well-developed and well-nourished. No distress. obese HENT: [place]/AT, PERRLA, no scleral icterus Mouth/Throat: Oropharynx is clear and dry. No oropharyngeal exudate.  Cardiovascular: Normal rate, regular rhythm and normal heart sounds. Pulmonary/Chest: Effort normal and breath sounds normal. No respiratory distress.  has no wheezes.  Neck = supple, no nuchal rigidity Abdominal: Soft. Bowel sounds are normal.  exhibits no distension. There is no tenderness.  Lymphadenopathy: no cervical adenopathy. No axillary adenopathy Neurological: alert and oriented to person, place, and time.  Ext 3+ edema bil LE Skin: Bil  leg with redness and warmth,. No open sores or purulence Psychiatric: a normal mood and affect.  behavior is normal.     LABS: Results for orders placed or performed during the hospital encounter of 04/01/17 (from the past 48 hour(s))  CBC with Differential     Status: Abnormal   Collection Time: 04/01/17  1:14 PM  Result Value Ref Range   WBC 14.8 (H) 3.6 - 11.0 K/uL   RBC 4.15 3.80 - 5.20 MIL/uL   Hemoglobin 12.2 12.0 - 16.0 g/dL   HCT 36.6 35.0 - 47.0 %   MCV 88.3 80.0 - 100.0 fL   MCH 29.5 26.0 - 34.0 pg   MCHC 33.4 32.0 - 36.0 g/dL   RDW 17.2 (H) 11.5 - 14.5 %   Platelets 242 150 - 440 K/uL   Neutrophils Relative % 95 %   Neutro Abs 14.1 (H) 1.4 - 6.5 K/uL   Lymphocytes Relative 1 %   Lymphs Abs 0.1 (L) 1.0 - 3.6 K/uL   Monocytes Relative 3 %   Monocytes Absolute 0.4 0.2 - 0.9 K/uL    Eosinophils Relative 1 %   Eosinophils Absolute 0.1 0 - 0.7 K/uL   Basophils Relative 0 %   Basophils Absolute 0.1 0 - 0.1 K/uL  Comprehensive metabolic panel  Status: Abnormal   Collection Time: 04/01/17  1:14 PM  Result Value Ref Range   Sodium 131 (L) 135 - 145 mmol/L   Potassium 3.9 3.5 - 5.1 mmol/L   Chloride 95 (L) 101 - 111 mmol/L   CO2 23 22 - 32 mmol/L   Glucose, Bld 119 (H) 65 - 99 mg/dL   BUN 21 (H) 6 - 20 mg/dL   Creatinine, Ser 1.76 (H) 0.44 - 1.00 mg/dL   Calcium 9.9 8.9 - 10.3 mg/dL   Total Protein 7.4 6.5 - 8.1 g/dL   Albumin 4.0 3.5 - 5.0 g/dL   AST 28 15 - 41 U/L   ALT 24 14 - 54 U/L   Alkaline Phosphatase 78 38 - 126 U/L   Total Bilirubin 0.6 0.3 - 1.2 mg/dL   GFR calc non Af Amer 29 (L) >60 mL/min   GFR calc Af Amer 34 (L) >60 mL/min    Comment: (NOTE) The eGFR has been calculated using the CKD EPI equation. This calculation has not been validated in all clinical situations. eGFR's persistently <60 mL/min signify possible Chronic Kidney Disease.    Anion gap 13 5 - 15  Troponin I     Status: Abnormal   Collection Time: 04/01/17  1:14 PM  Result Value Ref Range   Troponin I 0.04 (HH) <0.03 ng/mL    Comment: CRITICAL RESULT CALLED TO, READ BACK BY AND VERIFIED WITH DAJEA SCOTT RN AT 6389 04/01/17. MSS   Urinalysis, Complete w Microscopic     Status: Abnormal   Collection Time: 04/01/17  2:40 PM  Result Value Ref Range   Color, Urine YELLOW (A) YELLOW   APPearance HAZY (A) CLEAR   Specific Gravity, Urine 1.021 1.005 - 1.030   pH 6.0 5.0 - 8.0   Glucose, UA NEGATIVE NEGATIVE mg/dL   Hgb urine dipstick NEGATIVE NEGATIVE   Bilirubin Urine NEGATIVE NEGATIVE   Ketones, ur NEGATIVE NEGATIVE mg/dL   Protein, ur 100 (A) NEGATIVE mg/dL   Nitrite POSITIVE (A) NEGATIVE   Leukocytes, UA LARGE (A) NEGATIVE   RBC / HPF 0-5 0 - 5 RBC/hpf   WBC, UA TOO NUMEROUS TO COUNT 0 - 5 WBC/hpf   Bacteria, UA RARE (A) NONE SEEN   Squamous Epithelial / LPF 0-5 (A) NONE  SEEN   Mucus PRESENT   Glucose, capillary     Status: None   Collection Time: 04/01/17  3:55 PM  Result Value Ref Range   Glucose-Capillary 74 65 - 99 mg/dL  Troponin I (q 6hr x 3)     Status: Abnormal   Collection Time: 04/01/17  5:37 PM  Result Value Ref Range   Troponin I 0.09 (HH) <0.03 ng/mL    Comment: CRITICAL VALUE NOTED. VALUE IS CONSISTENT WITH PREVIOUSLY REPORTED/CALLED VALUE.MSS  Urinalysis, Complete w Microscopic     Status: Abnormal   Collection Time: 04/01/17 10:00 PM  Result Value Ref Range   Color, Urine YELLOW (A) YELLOW   APPearance HAZY (A) CLEAR   Specific Gravity, Urine 1.023 1.005 - 1.030   pH 5.0 5.0 - 8.0   Glucose, UA NEGATIVE NEGATIVE mg/dL   Hgb urine dipstick NEGATIVE NEGATIVE   Bilirubin Urine NEGATIVE NEGATIVE   Ketones, ur NEGATIVE NEGATIVE mg/dL   Protein, ur 30 (A) NEGATIVE mg/dL   Nitrite NEGATIVE NEGATIVE   Leukocytes, UA MODERATE (A) NEGATIVE   RBC / HPF 0-5 0 - 5 RBC/hpf   WBC, UA TOO NUMEROUS TO COUNT 0 - 5 WBC/hpf  Bacteria, UA NONE SEEN NONE SEEN   Squamous Epithelial / LPF 0-5 (A) NONE SEEN   WBC Clumps PRESENT    Ca Oxalate Crys, UA PRESENT   Troponin I (q 6hr x 3)     Status: Abnormal   Collection Time: 04/01/17 10:42 PM  Result Value Ref Range   Troponin I 0.11 (HH) <0.03 ng/mL    Comment: CRITICAL VALUE NOTED.  VALUE IS CONSISTENT WITH PREVIOUSLY REPORTED AND CALLED VALUE. JAG   CULTURE, BLOOD (ROUTINE X 2) w Reflex to ID Panel     Status: None (Preliminary result)   Collection Time: 04/01/17 10:42 PM  Result Value Ref Range   Specimen Description BLOOD BLOOD RIGHT HAND    Special Requests      BOTTLES DRAWN AEROBIC AND ANAEROBIC Blood Culture results may not be optimal due to an excessive volume of blood received in culture bottles   Culture NO GROWTH < 12 HOURS    Report Status PENDING   CULTURE, BLOOD (ROUTINE X 2) w Reflex to ID Panel     Status: None (Preliminary result)   Collection Time: 04/01/17 10:54 PM  Result  Value Ref Range   Specimen Description BLOOD BLOOD LEFT HAND    Special Requests      BOTTLES DRAWN AEROBIC AND ANAEROBIC Blood Culture results may not be optimal due to an excessive volume of blood received in culture bottles   Culture NO GROWTH < 12 HOURS    Report Status PENDING   Troponin I (q 6hr x 3)     Status: Abnormal   Collection Time: 04/02/17  4:04 AM  Result Value Ref Range   Troponin I 0.10 (HH) <0.03 ng/mL    Comment: CRITICAL VALUE NOTED. VALUE IS CONSISTENT WITH PREVIOUSLY REPORTED/CALLED VALUE. JAG  CK     Status: None   Collection Time: 04/02/17  4:04 AM  Result Value Ref Range   Total CK 156 38 - 234 U/L  Creatinine, serum     Status: Abnormal   Collection Time: 04/02/17 11:08 AM  Result Value Ref Range   Creatinine, Ser 2.39 (H) 0.44 - 1.00 mg/dL   GFR calc non Af Amer 20 (L) >60 mL/min   GFR calc Af Amer 23 (L) >60 mL/min    Comment: (NOTE) The eGFR has been calculated using the CKD EPI equation. This calculation has not been validated in all clinical situations. eGFR's persistently <60 mL/min signify possible Chronic Kidney Disease.    No components found for: ESR, C REACTIVE PROTEIN MICRO: Recent Results (from the past 720 hour(s))  CULTURE, BLOOD (ROUTINE X 2) w Reflex to ID Panel     Status: None (Preliminary result)   Collection Time: 04/01/17 10:42 PM  Result Value Ref Range Status   Specimen Description BLOOD BLOOD RIGHT HAND  Final   Special Requests   Final    BOTTLES DRAWN AEROBIC AND ANAEROBIC Blood Culture results may not be optimal due to an excessive volume of blood received in culture bottles   Culture NO GROWTH < 12 HOURS  Final   Report Status PENDING  Incomplete  CULTURE, BLOOD (ROUTINE X 2) w Reflex to ID Panel     Status: None (Preliminary result)   Collection Time: 04/01/17 10:54 PM  Result Value Ref Range Status   Specimen Description BLOOD BLOOD LEFT HAND  Final   Special Requests   Final    BOTTLES DRAWN AEROBIC AND ANAEROBIC  Blood Culture results may not be optimal due to  an excessive volume of blood received in culture bottles   Culture NO GROWTH < 12 HOURS  Final   Report Status PENDING  Incomplete    IMAGING: Dg Chest 2 View  Result Date: 04/01/2017 CLINICAL DATA:  [place] while walking to kitchen. EXAM: CHEST  2 VIEW COMPARISON:  Chest radiograph February 06, 2017 FINDINGS: Cardiomediastinal silhouette is unremarkable. Similar bronchitic changes without pleural effusion or focal consolidation. No pneumothorax. Single lumen LEFT chest Port-A-Cath has is similarly backed out, looped in the distribution of the internal jugular vein with distal tip projecting at brachiocephalic confluence. Soft tissue planes and included osseous structures are unchanged. Similar advanced cervical facet arthropathy. IMPRESSION: Mild bronchitic changes. Similarly positioned LEFT chest Port-A-Cath with distal tip projecting at brachiocephalic confluence. Electronically Signed   By: Elon Alas M.D.   On: 04/01/2017 14:14   Ct Head Wo Contrast  Result Date: 04/01/2017 CLINICAL DATA:  Loss of balance and fall today. History of multiple sclerosis. EXAM: CT HEAD WITHOUT CONTRAST TECHNIQUE: Contiguous axial images were obtained from the base of the skull through the vertex without intravenous contrast. COMPARISON:  Head CT scan 02/06/2017. FINDINGS: Brain: Cortical atrophy and hypoattenuation in the deep white matter structures are again seen. Remote lacunar infarction in the left cerebellum is also unchanged. No evidence of acute abnormality including hemorrhage, infarct, mass lesion, mass effect, midline shift or abnormal extra-axial fluid collection. No hydrocephalus or pneumocephalus. Vascular: Atherosclerosis noted. Skull: Intact. Sinuses/Orbits: Negative. Other: None. IMPRESSION: No acute intracranial abnormality. No change in atrophy and chronic white matter disease. Electronically Signed   By: Inge Rise M.D.   On: 04/01/2017 13:58    US Venous Img Lower Bilateral  Result Date: 04/02/2017 CLINICAL DATA:  Bilateral lower extremity pain and edema for the past week. History of smoking. History of lymphoma. Evaluate for DVT. EXAM: BILATERAL LOWER EXTREMITY VENOUS DOPPLER ULTRASOUND TECHNIQUE: Gray-scale sonography with graded compression, as well as color Doppler and duplex ultrasound were performed to evaluate the lower extremity deep venous systems from the level of the common femoral vein and including the common femoral, femoral, profunda femoral, popliteal and calf veins including the posterior tibial, peroneal and gastrocnemius veins when visible. The superficial great saphenous vein was also interrogated. Spectral Doppler was utilized to evaluate flow at rest and with distal augmentation maneuvers in the common femoral, femoral and popliteal veins. COMPARISON:  None. FINDINGS: RIGHT LOWER EXTREMITY Common Femoral Vein: No evidence of thrombus. Normal compressibility, respiratory phasicity and response to augmentation. Saphenofemoral Junction: No evidence of thrombus. Normal compressibility and flow on color Doppler imaging. Profunda Femoral Vein: No evidence of thrombus. Normal compressibility and flow on color Doppler imaging. Femoral Vein: No evidence of thrombus. Normal compressibility, respiratory phasicity and response to augmentation. Popliteal Vein: No evidence of thrombus. Normal compressibility, respiratory phasicity and response to augmentation. Calf Veins: No evidence of thrombus. Normal compressibility and flow on color Doppler imaging. Superficial Great Saphenous Vein: No evidence of thrombus. Normal compressibility. Venous Reflux:  None. Other Findings: There is a minimal subcutaneous edema at the level the right calf. LEFT LOWER EXTREMITY Common Femoral Vein: No evidence of thrombus. Normal compressibility, respiratory phasicity and response to augmentation. Saphenofemoral Junction: No evidence of thrombus. Normal  compressibility and flow on color Doppler imaging. Profunda Femoral Vein: No evidence of thrombus. Normal compressibility and flow on color Doppler imaging. Femoral Vein: No evidence of thrombus. Normal compressibility, respiratory phasicity and response to augmentation. Popliteal Vein: No evidence of thrombus. Normal compressibility, respiratory phasicity and  response to augmentation. Calf Veins: No evidence of thrombus. Normal compressibility and flow on color Doppler imaging. Superficial Great Saphenous Vein: No evidence of thrombus. Normal compressibility. Venous Reflux:  None. Other Findings: There is a minimal amount of subcutaneous edema at the level the left calf. IMPRESSION: No evidence of DVT within either lower extremity. Electronically Signed   By: Sandi Mariscal M.D.   On: 04/02/2017 09:47    Assessment:   MEGHEN AKOPYAN is a 66 y.o. female with MS, lymphoma admitted with recurrent Bil LE redness and swelling. She also has a chronic foley and in the past has had ucx grow Pseudomonas.  Most recent ucx with sensitive Klebsiella.  The Bil cellulitis is likely related to her impressive recurrent edema.  Most likely this is strep cellulitis.  She reports hx cellulitis in past and had unna-wraps at wound center per her report.  The main issue is not abx selection but control of edema.  Recommendations  Elevate legs- discussed with patient, and I have placed 4 pillows under her leg.  DC vanco - esp with renal issue now.  Change meropenem to ancef- her prior Kleb was sensitive to this.  If continues to improve will consider placing a unnawrap on legs and dcing or oral abx She did not follow up with me after her admission in Sept- this will be important to continue to work on edema control.   Thank you very much for allowing me to participate in the care of this patient. Please call with questions.   Cheral Marker. Ola Spurr, MD

## 2017-04-02 NOTE — Progress Notes (Signed)
[facility] NAME: [name]    MR#:  [id number]  DATE OF BIRTH:  03-29-1951  SUBJECTIVE:  CHIEF COMPLAINT:   Chief Complaint  Patient presents with  . Fall   Patient awake and alert. Some confusion. Still has pain in legs  On 2 L O2  REVIEW OF SYSTEMS:    Review of Systems  Constitutional: Positive for malaise/fatigue. Negative for chills and fever.  HENT: Negative for sore throat.   Eyes: Negative for blurred vision, double vision and pain.  Respiratory: Negative for cough, hemoptysis, shortness of breath and wheezing.   Cardiovascular: Negative for chest pain, palpitations, orthopnea and leg swelling.  Gastrointestinal: Negative for abdominal pain, constipation, diarrhea, heartburn, nausea and vomiting.  Genitourinary: Negative for dysuria and hematuria.  Musculoskeletal: Positive for falls. Negative for back pain and joint pain.  Skin: Negative for rash.  Neurological: Positive for weakness. Negative for sensory change, speech change, focal weakness and headaches.  Endo/Heme/Allergies: Does not bruise/bleed easily.  Psychiatric/Behavioral: Positive for memory loss. Negative for depression. The patient is not nervous/anxious.     DRUG ALLERGIES:  No Known Allergies  VITALS:  Blood pressure (!) 104/51, pulse 64, temperature 97.6 F (36.4 C), temperature source Oral, resp. rate 20, height 5\' 4"  (1.626 m), weight 127 kg (280 lb), SpO2 92 %.  PHYSICAL EXAMINATION:   Physical Exam  GENERAL:  66 y.o.-year-old patient lying in the bed with no acute distress.  EYES: Pupils equal, round, reactive to light and accommodation. No scleral icterus. Extraocular muscles intact.  HEENT: Head atraumatic, normocephalic. Oropharynx and nasopharynx clear.  NECK:  Supple, no jugular venous distention. No thyroid enlargement, no tenderness.  LUNGS: Normal breath sounds bilaterally, no wheezing, rales, rhonchi. No use of accessory  muscles of respiration.  CARDIOVASCULAR: S1, S2 normal. No murmurs, rubs, or gallops.  ABDOMEN: Soft, nontender, nondistended. Bowel sounds present. No organomegaly or mass.  Foley in place EXTREMITIES: No cyanosis, clubbing or edema b/l.    NEUROLOGIC: Cranial nerves II through XII are intact.  PSYCHIATRIC: The patient is alert and awake SKIN: Redness b/l LE extremities  LABORATORY PANEL:   CBC Recent Labs  Lab 04/01/17 1314  WBC 14.8*  HGB 12.2  HCT 36.6  PLT 242   ------------------------------------------------------------------------------------------------------------------ Chemistries  Recent Labs  Lab 04/01/17 1314 04/02/17 1108  NA 131*  --   K 3.9  --   CL 95*  --   CO2 23  --   GLUCOSE 119*  --   BUN 21*  --   CREATININE 1.76* 2.39*  CALCIUM 9.9  --   AST 28  --   ALT 24  --   ALKPHOS 78  --   BILITOT 0.6  --    ------------------------------------------------------------------------------------------------------------------  Cardiac Enzymes Recent Labs  Lab 04/02/17 0404  TROPONINI 0.10*   ------------------------------------------------------------------------------------------------------------------  RADIOLOGY:  Dg Chest 2 View  Result Date: 04/01/2017 CLINICAL DATA:  [place] [address] while walking to kitchen. EXAM: CHEST  2 VIEW COMPARISON:  Chest radiograph February 06, 2017 FINDINGS: Cardiomediastinal silhouette is unremarkable. Similar bronchitic changes without pleural effusion or focal consolidation. No pneumothorax. Single lumen LEFT chest Port-A-Cath has is similarly backed out, looped in the distribution of the internal jugular vein with distal tip projecting at brachiocephalic confluence. Soft tissue planes and included osseous structures are unchanged. Similar advanced cervical facet arthropathy. IMPRESSION: Mild bronchitic changes. Similarly positioned LEFT chest Port-A-Cath with distal tip projecting at brachiocephalic confluence. Electronically  Signed  By: Elon Alas M.D.   On: 04/01/2017 14:14   Ct Head Wo Contrast  Result Date: 04/01/2017 CLINICAL DATA:  Loss of balance and fall today. History of multiple sclerosis. EXAM: CT HEAD WITHOUT CONTRAST TECHNIQUE: Contiguous axial images were obtained from the base of the skull through the vertex without intravenous contrast. COMPARISON:  Head CT scan 02/06/2017. FINDINGS: Brain: Cortical atrophy and hypoattenuation in the deep white matter structures are again seen. Remote lacunar infarction in the left cerebellum is also unchanged. No evidence of acute abnormality including hemorrhage, infarct, mass lesion, mass effect, midline shift or abnormal extra-axial fluid collection. No hydrocephalus or pneumocephalus. Vascular: Atherosclerosis noted. Skull: Intact. Sinuses/Orbits: Negative. Other: None. IMPRESSION: No acute intracranial abnormality. No change in atrophy and chronic white matter disease. Electronically Signed   By: Inge Rise M.D.   On: 04/01/2017 13:58   US Venous Img Lower Bilateral  Result Date: 04/02/2017 CLINICAL DATA:  Bilateral lower extremity pain and edema for the past week. History of smoking. History of lymphoma. Evaluate for DVT. EXAM: BILATERAL LOWER EXTREMITY VENOUS DOPPLER ULTRASOUND TECHNIQUE: Gray-scale sonography with graded compression, as well as color Doppler and duplex ultrasound were performed to evaluate the lower extremity deep venous systems from the level of the common femoral vein and including the common femoral, femoral, profunda femoral, popliteal and calf veins including the posterior tibial, peroneal and gastrocnemius veins when visible. The superficial great saphenous vein was also interrogated. Spectral Doppler was utilized to evaluate flow at rest and with distal augmentation maneuvers in the common femoral, femoral and popliteal veins. COMPARISON:  None. FINDINGS: RIGHT LOWER EXTREMITY Common Femoral Vein: No evidence of thrombus. Normal  compressibility, respiratory phasicity and response to augmentation. Saphenofemoral Junction: No evidence of thrombus. Normal compressibility and flow on color Doppler imaging. Profunda Femoral Vein: No evidence of thrombus. Normal compressibility and flow on color Doppler imaging. Femoral Vein: No evidence of thrombus. Normal compressibility, respiratory phasicity and response to augmentation. Popliteal Vein: No evidence of thrombus. Normal compressibility, respiratory phasicity and response to augmentation. Calf Veins: No evidence of thrombus. Normal compressibility and flow on color Doppler imaging. Superficial Great Saphenous Vein: No evidence of thrombus. Normal compressibility. Venous Reflux:  None. Other Findings: There is a minimal subcutaneous edema at the level the right calf. LEFT LOWER EXTREMITY Common Femoral Vein: No evidence of thrombus. Normal compressibility, respiratory phasicity and response to augmentation. Saphenofemoral Junction: No evidence of thrombus. Normal compressibility and flow on color Doppler imaging. Profunda Femoral Vein: No evidence of thrombus. Normal compressibility and flow on color Doppler imaging. Femoral Vein: No evidence of thrombus. Normal compressibility, respiratory phasicity and response to augmentation. Popliteal Vein: No evidence of thrombus. Normal compressibility, respiratory phasicity and response to augmentation. Calf Veins: No evidence of thrombus. Normal compressibility and flow on color Doppler imaging. Superficial Great Saphenous Vein: No evidence of thrombus. Normal compressibility. Venous Reflux:  None. Other Findings: There is a minimal amount of subcutaneous edema at the level the left calf. IMPRESSION: No evidence of DVT within either lower extremity. Electronically Signed   By: Sandi Mariscal M.D.   On: 04/02/2017 09:47     ASSESSMENT AND PLAN:   Kawana Hegel  is a 66 y.o. female with a known history of chronic multiple sclerosis, essential  hypertension, COPD, chronic indwelling Foley catheter is presenting to the ED after she had multiple falls  # hypotension due to severe sepsis Will bolus NS STAT. BP meds held  #Bilateral  lower extremity cellulitis-left greater than  right with sepsis POA Failed outpatient antibiotics Keflex Started patient on vancomycin, meropenem Discussed with ID. Change to Cefazolin  #Acute cystitis with abnormal urinalysis-chronic indwelling Foley catheter No h/o any drug resistent organisms ON cefazolin  #Acute kidney injury with poor by mouth intake and ATN IVF Hold HCTZ  #Hyponatremia from poor by mouth intake IV fluids and hold hydrochlorothiazide  #Frequent falls PT evaluation  #Chronic history of multiple sclerosis  #History of B-cell lymphoma status post radiation therapy  outpatient follow-up with oncology  All the records are reviewed and case discussed with Care Management/Social Worker Management plans discussed with the patient, family and they are in agreement.  CODE STATUS: FULL CODE  DVT Prophylaxis: SCDs  TOTAL CC TIME TAKING CARE OF THIS PATIENT: 35 minutes.   POSSIBLE D/C IN 1-2 DAYS, DEPENDING ON CLINICAL CONDITION.  [name] M.D on 04/02/2017 at 6:18 PM  Between 7am to 6pm - Pager - [phone]  After 6pm go to [email or web address] - password [email or web address] [facility]  Office  [phone]  CC: Primary care physician; Arnetha Courser, MD  Note: This dictation was prepared with Dragon dictation along with smaller phrase technology. Any transcriptional errors that result from this process are unintentional.

## 2017-04-02 NOTE — Clinical Social Work Note (Signed)
Clinical Social Work Assessment  Patient Details  Name: ROSSANA MOLCHAN MRN: [id number] Date of Birth: 30-Oct-1950  Date of referral:  04/02/17               Reason for consult:  [employer]                Permission sought to share information with:  Case Optician, dispensing granted to share information::  Yes, Verbal Permission Granted  Name::        Agency::     Relationship::     Contact Information:     Housing/Transportation Living arrangements for the past 2 months:  Single Family Home Source of Information:  Patient Patient Interpreter Needed:  None Criminal Activity/Legal Involvement Pertinent to Current Situation/Hospitalization:  No - Comment as needed Significant Relationships:  Friend, Spouse Lives with:  Spouse, Friends Do you feel safe going back to the place where you live?  Yes Need for family participation in patient care:  Yes (Comment)  Care giving concerns:  Patient lives in [place] with her husband [name] and friend [name].    Social Worker assessment / plan:  Holiday representative (CSW) received consult to assess for needs. CSW is very familiar with patient from several previous admissions. Patient has been placed several times at SNFs and encouraged to apply for long term care medicaid and stay long term at SNF. CSW met with patient alone at bedside to discuss any needs she may have. Patient reported that she recently discharged from [facility] for short term rehab after using her 20 days. CSW explained that if patient were to go back to SNF without a 60 day wellness period she would be in her co-pay days at [facility]. Patient reported that she does not want to go to SNF and stated that she is going back home. CSW made RN case manager aware of above. Patient reported no other needs at this time. CSW will continue to follow and assist as needed.   Employment status:  Disabled (Comment on whether or not currently receiving Disability) Insurance information:  Managed  Medicare PT Recommendations:  Not assessed at this time Information / Referral to community resources:  Other (Comment Required)(Patient prefers to go home. )  Patient/Family's Response to care:  Patient reported that she wants to return home.   Patient/Family's Understanding of and Emotional Response to Diagnosis, Current Treatment, and Prognosis:  Patient was pleasant and thanked CSW for assistance.   Emotional Assessment Appearance:  Appears stated age Attitude/Demeanor/Rapport:    Affect (typically observed):  Pleasant Orientation:  Oriented to Self, Oriented to Place, Oriented to  Time, Oriented to Situation Alcohol / Substance use:  Not Applicable Psych involvement (Current and /or in the community):  No (Comment)  Discharge Needs  Concerns to be addressed:  Discharge Planning Concerns Readmission within the last 30 days:  No Current discharge risk:  Dependent with Mobility, Chronically ill Barriers to Discharge:  Continued Medical Work up   [employer], Veronia Beets, LCSW 04/02/2017, 1:33 PM

## 2017-04-02 NOTE — Consult Note (Signed)
Pharmacy Antibiotic Note  [name] is a 66 y.o. female admitted on 04/01/2017 with cellulitis and UTI.  Pharmacy has been consulted for vancomycin and meropenem dosing. Pt has a hx of UTI- in sept her Ucx grew pan sensitive pseudomonas. Pt has been being treated for cellulitis. First with topical bactroban then with keflex and bactrim ( keflex since 11/30, bactrim since yesterday with worsening)  Plan: Vancomycin 1000mg  once. Give next dose in 6 hours for stacked dosing Vancomycin 1250 IV every 24 hours.  Goal trough 10-15 mcg/mL.  Trough prior to the 5th dose Meropenem 1g q 12hr  12/5:  Scr increased to 2.39. Will check Random Vancomycin level at 2000 prior to next dose to assess. (This is after 2 doses of Vancomycin so not at steady state).     Height: 5\' 4"  (162.6 cm) Weight: 280 lb (127 kg) IBW/kg (Calculated) : 54.7  Temp (24hrs), Avg:99.5 F (37.5 C), Min:97.6 F (36.4 C), Max:102.6 F (39.2 C)  Recent Labs  Lab 03/31/17 1535 04/01/17 1314 04/02/17 1108  WBC 5.1 14.8*  --   CREATININE 1.19* 1.76* 2.39*    Estimated Creatinine Clearance: 30.6 mL/min (A) (by C-G formula based on SCr of 2.39 mg/dL (H)).    No Known Allergies  Antimicrobials this admission: vancomycin 12/4 >>  meropenem 12/4 >>   Dose adjustments this admission:   Microbiology results: 12/4 UCx:     Thank you for allowing pharmacy to be a part of this patient's care.  Chinita Greenland PharmD Clinical Pharmacist 04/02/2017

## 2017-04-02 NOTE — Progress Notes (Signed)
Spoke with Dr Marcille Blanco regarding hypotension.  Dr Marcille Blanco to order fluid bolus. Dorna Bloom RN

## 2017-04-02 NOTE — Plan of Care (Signed)
  Progressing Education: Knowledge of General Education information will improve 04/02/2017 0257 - Progressing by Denice Bors, RN Clinical Measurements: Will remain free from infection 04/02/2017 0257 - Progressing by Denice Bors, RN Diagnostic test results will improve 04/02/2017 0257 - Progressing by Denice Bors, RN Respiratory complications will improve 04/02/2017 0257 - Progressing by Denice Bors, RN Cardiovascular complication will be avoided 04/02/2017 0257 - Progressing by Denice Bors, RN Coping: Level of anxiety will decrease 04/02/2017 0257 - Progressing by Denice Bors, RN Pain Managment: General experience of comfort will improve 04/02/2017 0257 - Progressing by Denice Bors, RN Clinical Measurements: Ability to avoid or minimize complications of infection will improve 04/02/2017 0257 - Progressing by Denice Bors, RN Skin Integrity: Skin integrity will improve 04/02/2017 0257 - Progressing by Denice Bors, RN

## 2017-04-03 LAB — CBC WITH DIFFERENTIAL/PLATELET
Basophils Absolute: 0.1 10*3/uL (ref 0–0.1)
Basophils Relative: 1 %
Eosinophils Absolute: 0.3 10*3/uL (ref 0–0.7)
Eosinophils Relative: 7 %
HCT: 29.7 % — ABNORMAL LOW (ref 35.0–47.0)
Hemoglobin: 9.9 g/dL — ABNORMAL LOW (ref 12.0–16.0)
Lymphocytes Relative: 5 %
Lymphs Abs: 0.3 10*3/uL — ABNORMAL LOW (ref 1.0–3.6)
MCH: 30.1 pg (ref 26.0–34.0)
MCHC: 33.4 g/dL (ref 32.0–36.0)
MCV: 90.2 fL (ref 80.0–100.0)
Monocytes Absolute: 0.4 10*3/uL (ref 0.2–0.9)
Monocytes Relative: 8 %
Neutro Abs: 4.2 10*3/uL (ref 1.4–6.5)
Neutrophils Relative %: 79 %
Platelets: 169 10*3/uL (ref 150–440)
RBC: 3.3 MIL/uL — ABNORMAL LOW (ref 3.80–5.20)
RDW: 17.3 % — ABNORMAL HIGH (ref 11.5–14.5)
WBC: 5.3 10*3/uL (ref 3.6–11.0)

## 2017-04-03 LAB — BASIC METABOLIC PANEL
Anion gap: 10 (ref 5–15)
BUN: 37 mg/dL — ABNORMAL HIGH (ref 6–20)
CO2: 24 mmol/L (ref 22–32)
Calcium: 8.9 mg/dL (ref 8.9–10.3)
Chloride: 97 mmol/L — ABNORMAL LOW (ref 101–111)
Creatinine, Ser: 2.51 mg/dL — ABNORMAL HIGH (ref 0.44–1.00)
GFR calc Af Amer: 22 mL/min — ABNORMAL LOW (ref >60)
GFR calc non Af Amer: 19 mL/min — ABNORMAL LOW (ref >60)
Glucose, Bld: 101 mg/dL — ABNORMAL HIGH (ref 65–99)
Potassium: 4.4 mmol/L (ref 3.5–5.1)
Sodium: 131 mmol/L — ABNORMAL LOW (ref 135–145)

## 2017-04-03 MED ORDER — FOSFOMYCIN TROMETHAMINE 3 G PO PACK
3.0000 g | PACK | Freq: Once | ORAL | Status: AC
  Administered 2017-04-03: 3 g via ORAL
  Filled 2017-04-03: qty 3

## 2017-04-03 MED ORDER — CEFAZOLIN SODIUM-DEXTROSE 1-4 GM/50ML-% IV SOLN
1.0000 g | Freq: Two times a day (BID) | INTRAVENOUS | Status: DC
  Filled 2017-04-03: qty 50

## 2017-04-03 MED ORDER — DEXTROSE 5 % IV SOLN
1.0000 g | Freq: Two times a day (BID) | INTRAVENOUS | Status: DC
  Administered 2017-04-03 – 2017-04-05 (×4): 1 g via INTRAVENOUS
  Filled 2017-04-03 (×5): qty 10

## 2017-04-03 NOTE — Progress Notes (Signed)
[facility] INFECTIOUS DISEASE PROGRESS NOTE Date of Admission:  04/01/2017     ID: [name] is a 66 y.o. female with LE cellulitis Active Problems:   Cellulitis   Subjective: Not elevating leg much but some decrease redness.   ROS  Eleven systems are reviewed and negative except per hpi  Medications:  Antibiotics Given (last 72 hours)    Date/Time Action Medication Dose Rate   04/01/17 1504 New Bag/Given   vancomycin (VANCOCIN) IVPB 1000 mg/200 mL premix 1,000 mg 200 mL/hr   04/01/17 1735 New Bag/Given   meropenem (MERREM) 1 g in sodium chloride 0.9 % 100 mL IVPB 1 g 200 mL/hr   04/01/17 2330 New Bag/Given   vancomycin (VANCOCIN) 1,250 mg in sodium chloride 0.9 % 250 mL IVPB 1,250 mg 166.7 mL/hr   04/02/17 0433 New Bag/Given   meropenem (MERREM) 1 g in sodium chloride 0.9 % 100 mL IVPB 1 g 200 mL/hr   04/02/17 1832 New Bag/Given   ceFAZolin (ANCEF) 500 mg in dextrose 5 % 100 mL IVPB 500 mg 210 mL/hr   04/03/17 0908 New Bag/Given   ceFAZolin (ANCEF) 500 mg in dextrose 5 % 100 mL IVPB 500 mg 210 mL/hr     . buPROPion  300 mg Oral BH-q7a  . clonazePAM  1 mg Oral QHS  . darifenacin  7.5 mg Oral Daily  . DULoxetine  60 mg Oral BH-q7a  . fluticasone furoate-vilanterol  1 puff Inhalation Daily  . gabapentin  600 mg Oral TID  . heparin injection (subcutaneous)  5,000 Units Subcutaneous Q8H  . [START ON 04/07/2017] interferon beta-1a  30 mcg Intramuscular Q Mon  . methylphenidate  10 mg Oral BID  . multivitamin with minerals  1 tablet Oral Daily  . mupirocin ointment  1 application Topical BID  . potassium chloride  10 mEq Oral Daily  . QUEtiapine Fumarate  150 mg Oral QHS  . tiZANidine  4 mg Oral QID  . topiramate  50 mg Oral Daily  . vitamin C  500 mg Oral Daily    Objective: Vital signs in last 24 hours: Temp:  [97.4 F (36.3 C)-97.8 F (36.6 C)] 97.6 F (36.4 C) (12/06 1219) Pulse Rate:  [50-82] 72 (12/06 1219) Resp:  [19-20] 20 (12/06 1219) BP:  (95-127)/(47-60) 122/51 (12/06 1219) SpO2:  [98 %-100 %] 100 % (12/06 1219) Constitutional: oriented to person, place, and time. appears well-developed and well-nourished. No distress. obese HENT: Optima/AT, PERRLA, no scleral icterus Mouth/Throat: Oropharynx is clear anddry. No oropharyngeal exudate.  Cardiovascular: Normal rate, regular rhythm and normal heart sounds. Pulmonary/Chest: Effort normal and breath sounds normal. No respiratory distress. has no wheezes.  Neck = supple, no nuchal rigidity Abdominal: Soft. Bowel sounds are normal. exhibits no distension. There is no tenderness.  Lymphadenopathy: no cervical adenopathy. No axillary adenopathy Neurological: alert and oriented to person, place, and time.  Ext 3+ edema bil LE Skin:Bil  leg with redness and warmth,. No open sores or purulence Psychiatric: a normal mood and affect. behavior is normal.    Lab Results Recent Labs    04/01/17 1314 04/02/17 1108 04/03/17 0441  WBC 14.8*  --  5.3  HGB 12.2  --  9.9*  HCT 36.6  --  29.7*  NA 131*  --  131*  K 3.9  --  4.4  CL 95*  --  97*  CO2 23  --  24  BUN 21*  --  37*  CREATININE 1.76* 2.39* 2.51*  Microbiology: Results for orders placed or performed during the hospital encounter of 04/01/17  Urine Culture     Status: Abnormal (Preliminary result)   Collection Time: 04/01/17  2:45 PM  Result Value Ref Range Status   Specimen Description URINE, RANDOM  Final   Special Requests NONE  Final   Culture >=100,000 COLONIES/mL PSEUDOMONAS AERUGINOSA (A)  Final   Report Status PENDING  Incomplete  CULTURE, BLOOD (ROUTINE X 2) w Reflex to ID Panel     Status: None (Preliminary result)   Collection Time: 04/01/17 10:42 PM  Result Value Ref Range Status   Specimen Description BLOOD BLOOD RIGHT HAND  Final   Special Requests   Final    BOTTLES DRAWN AEROBIC AND ANAEROBIC Blood Culture results may not be optimal due to an excessive volume of blood received in culture bottles    Culture NO GROWTH 2 DAYS  Final   Report Status PENDING  Incomplete  CULTURE, BLOOD (ROUTINE X 2) w Reflex to ID Panel     Status: None (Preliminary result)   Collection Time: 04/01/17 10:54 PM  Result Value Ref Range Status   Specimen Description BLOOD BLOOD LEFT HAND  Final   Special Requests   Final    BOTTLES DRAWN AEROBIC AND ANAEROBIC Blood Culture results may not be optimal due to an excessive volume of blood received in culture bottles   Culture NO GROWTH 2 DAYS  Final   Report Status PENDING  Incomplete    Studies/Results: US Venous Img Lower Bilateral  Result Date: 04/02/2017 CLINICAL DATA:  Bilateral lower extremity pain and edema for the past week. History of smoking. History of lymphoma. Evaluate for DVT. EXAM: BILATERAL LOWER EXTREMITY VENOUS DOPPLER ULTRASOUND TECHNIQUE: Gray-scale sonography with graded compression, as well as color Doppler and duplex ultrasound were performed to evaluate the lower extremity deep venous systems from the level of the common femoral vein and including the common femoral, femoral, profunda femoral, popliteal and calf veins including the posterior tibial, peroneal and gastrocnemius veins when visible. The superficial great saphenous vein was also interrogated. Spectral Doppler was utilized to evaluate flow at rest and with distal augmentation maneuvers in the common femoral, femoral and popliteal veins. COMPARISON:  None. FINDINGS: RIGHT LOWER EXTREMITY Common Femoral Vein: No evidence of thrombus. Normal compressibility, respiratory phasicity and response to augmentation. Saphenofemoral Junction: No evidence of thrombus. Normal compressibility and flow on color Doppler imaging. Profunda Femoral Vein: No evidence of thrombus. Normal compressibility and flow on color Doppler imaging. Femoral Vein: No evidence of thrombus. Normal compressibility, respiratory phasicity and response to augmentation. Popliteal Vein: No evidence of thrombus. Normal  compressibility, respiratory phasicity and response to augmentation. Calf Veins: No evidence of thrombus. Normal compressibility and flow on color Doppler imaging. Superficial Great Saphenous Vein: No evidence of thrombus. Normal compressibility. Venous Reflux:  None. Other Findings: There is a minimal subcutaneous edema at the level the right calf. LEFT LOWER EXTREMITY Common Femoral Vein: No evidence of thrombus. Normal compressibility, respiratory phasicity and response to augmentation. Saphenofemoral Junction: No evidence of thrombus. Normal compressibility and flow on color Doppler imaging. Profunda Femoral Vein: No evidence of thrombus. Normal compressibility and flow on color Doppler imaging. Femoral Vein: No evidence of thrombus. Normal compressibility, respiratory phasicity and response to augmentation. Popliteal Vein: No evidence of thrombus. Normal compressibility, respiratory phasicity and response to augmentation. Calf Veins: No evidence of thrombus. Normal compressibility and flow on color Doppler imaging. Superficial Great Saphenous Vein: No evidence of thrombus. Normal compressibility.  Venous Reflux:  None. Other Findings: There is a minimal amount of subcutaneous edema at the level the left calf. IMPRESSION: No evidence of DVT within either lower extremity. Electronically Signed   By: Sandi Mariscal M.D.   On: 04/02/2017 09:47    Assessment/Plan: [name] a 66 y.o.femalewith MS, lymphoma admitted with recurrent Bil LE redness and swelling. She also has a chronic foley and in the past has had ucx grow Pseudomonas.  Most recent ucx with sensitive Klebsiella.  The Bil cellulitis is likely related to her impressive recurrent edema.  Most likely this is strep cellulitis.  She reports hx cellulitis in past and had unna-wraps at wound center per her report. The main issue is not abx selection but control of edema.  Recommendations Elevate legs- discussed with patient, and I have placed 4  pillows under her leg.  Cont ancef In AM place Unnawrap  UTI - pseudomonas- fosfomycin today   She did not follow up with me after her admission in Sept- this will be important to continue to work on edema control.    Thank you very much for the consult. Will follow with you.  Leonel Ramsay   04/03/2017, 3:03 PM

## 2017-04-03 NOTE — Care Management Important Message (Signed)
Important Message  Patient Details  Name: [name] MRN: [id number] Date of Birth: 21-Aug-1950   Medicare Important Message Given:  Yes    Shelbie Ammons, RN 04/03/2017, 6:57 AM

## 2017-04-03 NOTE — Progress Notes (Signed)
[facility] NAME: [name]    MR#:  [id number]  DATE OF BIRTH:  1951-02-20  SUBJECTIVE:  CHIEF COMPLAINT:   Chief Complaint  Patient presents with  . Fall   Patient awake and alert. Reports left leg redness is getting worse  On 2 L O2  REVIEW OF SYSTEMS:    Review of Systems  Constitutional: Positive for malaise/fatigue. Negative for chills and fever.  HENT: Negative for sore throat.   Eyes: Negative for blurred vision, double vision and pain.  Respiratory: Negative for cough, hemoptysis, shortness of breath and wheezing.   Cardiovascular: Negative for chest pain, palpitations, orthopnea and leg swelling.  Gastrointestinal: Negative for abdominal pain, constipation, diarrhea, heartburn, nausea and vomiting.  Genitourinary: Negative for dysuria and hematuria.  Musculoskeletal: Positive for falls. Negative for back pain and joint pain.  Skin: Negative for rash.  Neurological: Positive for weakness. Negative for sensory change, speech change, focal weakness and headaches.  Endo/Heme/Allergies: Does not bruise/bleed easily.  Psychiatric/Behavioral: Positive for memory loss. Negative for depression. The patient is not nervous/anxious.     DRUG ALLERGIES:  No Known Allergies  VITALS:  Blood pressure (!) 122/51, pulse 72, temperature 97.6 F (36.4 C), temperature source Oral, resp. rate 20, height 5\' 4"  (1.626 m), weight 127 kg (280 lb), SpO2 100 %.  PHYSICAL EXAMINATION:   Physical Exam  GENERAL:  66 y.o.-year-old patient lying in the bed with no acute distress.  EYES: Pupils equal, round, reactive to light and accommodation. No scleral icterus. Extraocular muscles intact.  HEENT: Head atraumatic, normocephalic. Oropharynx and nasopharynx clear.  NECK:  Supple, no jugular venous distention. No thyroid enlargement, no tenderness.  LUNGS: Normal breath sounds bilaterally, no wheezing, rales, rhonchi. No use of accessory  muscles of respiration.  CARDIOVASCULAR: S1, S2 normal. No murmurs, rubs, or gallops.  ABDOMEN: Soft, nontender, nondistended. Bowel sounds present. No organomegaly or mass.  Foley in place EXTREMITIES: No cyanosis, clubbing or edema b/l.    NEUROLOGIC: Cranial nerves II through XII are intact.  PSYCHIATRIC: The patient is alert and awake SKIN: Redness b/l LE extremities-left lower extremity is more erythematous than right lower extremity  LABORATORY PANEL:   CBC Recent Labs  Lab 04/03/17 0441  WBC 5.3  HGB 9.9*  HCT 29.7*  PLT 169   ------------------------------------------------------------------------------------------------------------------ Chemistries  Recent Labs  Lab 04/01/17 1314  04/03/17 0441  NA 131*  --  131*  K 3.9  --  4.4  CL 95*  --  97*  CO2 23  --  24  GLUCOSE 119*  --  101*  BUN 21*  --  37*  CREATININE 1.76*   < > 2.51*  CALCIUM 9.9  --  8.9  AST 28  --   --   ALT 24  --   --   ALKPHOS 78  --   --   BILITOT 0.6  --   --    < > = values in this interval not displayed.   ------------------------------------------------------------------------------------------------------------------  Cardiac Enzymes Recent Labs  Lab 04/02/17 0404  TROPONINI 0.10*   ------------------------------------------------------------------------------------------------------------------  RADIOLOGY:  US Venous Img Lower Bilateral  Result Date: 04/02/2017 CLINICAL DATA:  Bilateral lower extremity pain and edema for the past week. History of smoking. History of lymphoma. Evaluate for DVT. EXAM: BILATERAL LOWER EXTREMITY VENOUS DOPPLER ULTRASOUND TECHNIQUE: Gray-scale sonography with graded compression, as well as color Doppler and duplex ultrasound were performed to evaluate the lower extremity deep  venous systems from the level of the common femoral vein and including the common femoral, femoral, profunda femoral, popliteal and calf veins including the posterior tibial,  peroneal and gastrocnemius veins when visible. The superficial great saphenous vein was also interrogated. Spectral Doppler was utilized to evaluate flow at rest and with distal augmentation maneuvers in the common femoral, femoral and popliteal veins. COMPARISON:  None. FINDINGS: RIGHT LOWER EXTREMITY Common Femoral Vein: No evidence of thrombus. Normal compressibility, respiratory phasicity and response to augmentation. Saphenofemoral Junction: No evidence of thrombus. Normal compressibility and flow on color Doppler imaging. Profunda Femoral Vein: No evidence of thrombus. Normal compressibility and flow on color Doppler imaging. Femoral Vein: No evidence of thrombus. Normal compressibility, respiratory phasicity and response to augmentation. Popliteal Vein: No evidence of thrombus. Normal compressibility, respiratory phasicity and response to augmentation. Calf Veins: No evidence of thrombus. Normal compressibility and flow on color Doppler imaging. Superficial Great Saphenous Vein: No evidence of thrombus. Normal compressibility. Venous Reflux:  None. Other Findings: There is a minimal subcutaneous edema at the level the right calf. LEFT LOWER EXTREMITY Common Femoral Vein: No evidence of thrombus. Normal compressibility, respiratory phasicity and response to augmentation. Saphenofemoral Junction: No evidence of thrombus. Normal compressibility and flow on color Doppler imaging. Profunda Femoral Vein: No evidence of thrombus. Normal compressibility and flow on color Doppler imaging. Femoral Vein: No evidence of thrombus. Normal compressibility, respiratory phasicity and response to augmentation. Popliteal Vein: No evidence of thrombus. Normal compressibility, respiratory phasicity and response to augmentation. Calf Veins: No evidence of thrombus. Normal compressibility and flow on color Doppler imaging. Superficial Great Saphenous Vein: No evidence of thrombus. Normal compressibility. Venous Reflux:  None. Other  Findings: There is a minimal amount of subcutaneous edema at the level the left calf. IMPRESSION: No evidence of DVT within either lower extremity. Electronically Signed   By: Sandi Mariscal M.D.   On: 04/02/2017 09:47     ASSESSMENT AND PLAN:   [name]  is a 66 y.o. female with a known history of chronic multiple sclerosis, essential hypertension, COPD, chronic indwelling Foley catheter is presenting to the ED after she had multiple falls  # hypotension due to severe sepsis Resolved with IV fluids Blood cultures are negative so far  #Bilateral  lower extremity cellulitis-left greater than right with sepsis POA Failed outpatient antibiotics Keflex Started patient on vancomycin, meropenem Discussed with ID. Change to Cefazolin  #Acute cystitis with abnormal urinalysis-chronic indwelling Foley catheter Foley catheter was changed in the ED during this admission No h/o any drug resistent organisms Urine culture greater than 100,000 colonies of Pseudomonas ON cefazolin  #Acute kidney injury with poor by mouth intake and ATN IVF Hold HCTZ Creatinine 2.39-2.5   #Hyponatremia from poor by mouth intake IV fluids and hold hydrochlorothiazide  #Frequent falls PT evaluation-patient ambulates very short distances  #Chronic history of multiple sclerosis  #History of B-cell lymphoma status post radiation therapy  outpatient follow-up with oncology  All the records are reviewed and case discussed with Care Management/Social Worker Management plans discussed with the patient, patient's friend [name] and they are in agreement.  CODE STATUS: FULL CODE  DVT Prophylaxis: SCDs  TOTAL CC TIME TAKING CARE OF THIS PATIENT: 35 minutes.   POSSIBLE D/C IN 1-2 DAYS, DEPENDING ON CLINICAL CONDITION.  Nicholes Mango M.D on 04/03/2017 at 3:06 PM  Between 7am to 6pm - Pager - [phone]  After 6pm go to [email or web address] - password [email or web address] [facility]  Office  [phone]  CC: Primary care physician; Arnetha Courser, MD  Note: This dictation was prepared with Dragon dictation along with smaller phrase technology. Any transcriptional errors that result from this process are unintentional.

## 2017-04-03 NOTE — Clinical Social Work Note (Signed)
CSW received referral for SNF.  Case discussed with case manager and plan is to discharge home with home health.  CSW to sign off please re-consult if social work needs arise.  Jackie Littlejohn R. Ashaun Gaughan, MSW, LCSWA [phone]  

## 2017-04-04 ENCOUNTER — Ambulatory Visit: Payer: Self-pay | Admitting: *Deleted

## 2017-04-04 LAB — BASIC METABOLIC PANEL
Anion gap: 8 (ref 5–15)
BUN: 28 mg/dL — ABNORMAL HIGH (ref 6–20)
CO2: 23 mmol/L (ref 22–32)
Calcium: 8.8 mg/dL — ABNORMAL LOW (ref 8.9–10.3)
Chloride: 101 mmol/L (ref 101–111)
Creatinine, Ser: 1.69 mg/dL — ABNORMAL HIGH (ref 0.44–1.00)
GFR calc Af Amer: 35 mL/min — ABNORMAL LOW (ref >60)
GFR calc non Af Amer: 30 mL/min — ABNORMAL LOW (ref >60)
Glucose, Bld: 86 mg/dL (ref 65–99)
Potassium: 3.9 mmol/L (ref 3.5–5.1)
Sodium: 132 mmol/L — ABNORMAL LOW (ref 135–145)

## 2017-04-04 LAB — URINE CULTURE: Culture: >=100000 — AB

## 2017-04-04 LAB — CBC
HCT: 29.4 % — ABNORMAL LOW (ref 35.0–47.0)
Hemoglobin: 9.7 g/dL — ABNORMAL LOW (ref 12.0–16.0)
MCH: 29.5 pg (ref 26.0–34.0)
MCHC: 33 g/dL (ref 32.0–36.0)
MCV: 89.5 fL (ref 80.0–100.0)
Platelets: 191 10*3/uL (ref 150–440)
RBC: 3.29 MIL/uL — ABNORMAL LOW (ref 3.80–5.20)
RDW: 16.8 % — ABNORMAL HIGH (ref 11.5–14.5)
WBC: 3.6 10*3/uL (ref 3.6–11.0)

## 2017-04-04 MED ORDER — METHOCARBAMOL 500 MG PO TABS
500.0000 mg | ORAL_TABLET | Freq: Two times a day (BID) | ORAL | Status: DC | PRN
  Filled 2017-04-04: qty 1

## 2017-04-04 MED ORDER — CIPROFLOXACIN HCL 500 MG PO TABS
500.0000 mg | ORAL_TABLET | Freq: Two times a day (BID) | ORAL | Status: DC
  Administered 2017-04-04 – 2017-04-05 (×2): 500 mg via ORAL
  Filled 2017-04-04 (×2): qty 1

## 2017-04-04 MED ORDER — ORAL CARE MOUTH RINSE
15.0000 mL | Freq: Two times a day (BID) | OROMUCOSAL | Status: DC
  Administered 2017-04-04 – 2017-04-05 (×2): 15 mL via OROMUCOSAL

## 2017-04-04 MED ORDER — CIPROFLOXACIN HCL 500 MG PO TABS: 250.0000 mg | ORAL_TABLET | Freq: Two times a day (BID) | ORAL | Status: DC

## 2017-04-04 MED ORDER — METHOCARBAMOL 500 MG PO TABS
500.0000 mg | ORAL_TABLET | Freq: Two times a day (BID) | ORAL | Status: DC
  Administered 2017-04-04 – 2017-04-05 (×2): 500 mg via ORAL
  Filled 2017-04-04 (×3): qty 1

## 2017-04-04 NOTE — Progress Notes (Signed)
[facility] INFECTIOUS DISEASE PROGRESS NOTE Date of Admission:  04/01/2017     ID: [name] is a 66 y.o. female with LE cellulitis Active Problems:   Cellulitis   Subjective: Not elevating leg much but some decrease redness.   ROS  Eleven systems are reviewed and negative except per hpi  Medications:  Antibiotics Given (last 72 hours)    Date/Time Action Medication Dose Rate   04/01/17 1504 New Bag/Given   vancomycin (VANCOCIN) IVPB 1000 mg/200 mL premix 1,000 mg 200 mL/hr   04/01/17 1735 New Bag/Given   meropenem (MERREM) 1 g in sodium chloride 0.9 % 100 mL IVPB 1 g 200 mL/hr   04/01/17 2330 New Bag/Given   vancomycin (VANCOCIN) 1,250 mg in sodium chloride 0.9 % 250 mL IVPB 1,250 mg 166.7 mL/hr   04/02/17 0433 New Bag/Given   meropenem (MERREM) 1 g in sodium chloride 0.9 % 100 mL IVPB 1 g 200 mL/hr   04/02/17 1832 New Bag/Given   ceFAZolin (ANCEF) 500 mg in dextrose 5 % 100 mL IVPB 500 mg 210 mL/hr   04/03/17 0908 New Bag/Given   ceFAZolin (ANCEF) 500 mg in dextrose 5 % 100 mL IVPB 500 mg 210 mL/hr   04/03/17 1551 Given   fosfomycin (MONUROL) packet 3 g 3 g    04/03/17 2225 New Bag/Given   ceFAZolin (ANCEF) 1 g in dextrose 5 % 50 mL IVPB 1 g 120 mL/hr   04/04/17 0953 New Bag/Given   ceFAZolin (ANCEF) 1 g in dextrose 5 % 50 mL IVPB 1 g 120 mL/hr     . buPROPion  300 mg Oral BH-q7a  . clonazePAM  1 mg Oral QHS  . darifenacin  7.5 mg Oral Daily  . DULoxetine  60 mg Oral BH-q7a  . fluticasone furoate-vilanterol  1 puff Inhalation Daily  . gabapentin  600 mg Oral TID  . heparin injection (subcutaneous)  5,000 Units Subcutaneous Q8H  . [START ON 04/07/2017] interferon beta-1a  30 mcg Intramuscular Q Mon  . mouth rinse  15 mL Mouth Rinse BID  . methylphenidate  10 mg Oral BID  . multivitamin with minerals  1 tablet Oral Daily  . mupirocin ointment  1 application Topical BID  . potassium chloride  10 mEq Oral Daily  . QUEtiapine Fumarate  150 mg Oral QHS  .  tiZANidine  4 mg Oral QID  . topiramate  50 mg Oral Daily  . vitamin C  500 mg Oral Daily    Objective: Vital signs in last 24 hours: Temp:  [97.5 F (36.4 C)-97.7 F (36.5 C)] 97.7 F (36.5 C) (12/07 0840) Pulse Rate:  [63-66] 66 (12/07 0840) Resp:  [20] 20 (12/07 0840) BP: (100-107)/(48-55) 103/55 (12/07 0840) SpO2:  [98 %-100 %] 98 % (12/07 0840) Constitutional: oriented to person, place, and time. appears well-developed and well-nourished. No distress. obese HENT: [place]/AT, PERRLA, no scleral icterus Mouth/Throat: Oropharynx is clear anddry. No oropharyngeal exudate.  Cardiovascular: Normal rate, regular rhythm and normal heart sounds. Pulmonary/Chest: Effort normal and breath sounds normal. No respiratory distress. has no wheezes.  Neck = supple, no nuchal rigidity Abdominal: Soft. Bowel sounds are normal. exhibits no distension. There is no tenderness.  Lymphadenopathy: no cervical adenopathy. No axillary adenopathy Neurological: alert and oriented to person, place, and time.  Ext 2+ edema bil LE Skin:Bil  leg with less redness and warmth,. No open sores or purulence Psychiatric: a normal mood and affect. behavior is normal.    Lab Results Recent  Labs    04/03/17 0441 04/04/17 0506  WBC 5.3 3.6  HGB 9.9* 9.7*  HCT 29.7* 29.4*  NA 131* 132*  K 4.4 3.9  CL 97* 101  CO2 24 23  BUN 37* 28*  CREATININE 2.51* 1.69*    Microbiology: Results for orders placed or performed during the hospital encounter of 04/01/17  Urine Culture     Status: Abnormal   Collection Time: 04/01/17  2:45 PM  Result Value Ref Range Status   Specimen Description URINE, RANDOM  Final   Special Requests NONE  Final   Culture >=100,000 COLONIES/mL PSEUDOMONAS AERUGINOSA (A)  Final   Report Status 04/04/2017 FINAL  Final   Organism ID, Bacteria PSEUDOMONAS AERUGINOSA (A)  Final      Susceptibility   Pseudomonas aeruginosa - MIC*    CEFTAZIDIME 4 SENSITIVE Sensitive     CIPROFLOXACIN  <=0.25 SENSITIVE Sensitive     GENTAMICIN 2 SENSITIVE Sensitive     IMIPENEM 2 SENSITIVE Sensitive     PIP/TAZO 8 SENSITIVE Sensitive     CEFEPIME 2 SENSITIVE Sensitive     * >=100,000 COLONIES/mL PSEUDOMONAS AERUGINOSA  CULTURE, BLOOD (ROUTINE X 2) w Reflex to ID Panel     Status: None (Preliminary result)   Collection Time: 04/01/17 10:42 PM  Result Value Ref Range Status   Specimen Description BLOOD BLOOD RIGHT HAND  Final   Special Requests   Final    BOTTLES DRAWN AEROBIC AND ANAEROBIC Blood Culture results may not be optimal due to an excessive volume of blood received in culture bottles   Culture NO GROWTH 3 DAYS  Final   Report Status PENDING  Incomplete  CULTURE, BLOOD (ROUTINE X 2) w Reflex to ID Panel     Status: None (Preliminary result)   Collection Time: 04/01/17 10:54 PM  Result Value Ref Range Status   Specimen Description BLOOD BLOOD LEFT HAND  Final   Special Requests   Final    BOTTLES DRAWN AEROBIC AND ANAEROBIC Blood Culture results may not be optimal due to an excessive volume of blood received in culture bottles   Culture NO GROWTH 3 DAYS  Final   Report Status PENDING  Incomplete    Studies/Results: No results found.  Assessment/Plan: [name] a 66 y.o.femalewith MS, lymphoma admitted with recurrent Bil LE redness and swelling. She also has a chronic foley and in the past has had ucx grow Pseudomonas.  Most recent ucx with sensitive Klebsiella.  The Bil cellulitis is likely related to her impressive recurrent edema.  Most likely this is strep cellulitis.  She reports hx cellulitis in past and had unna-wraps at wound center per her report. The main issue is not abx selection but control of edema. Doing much better with elevation.  Recommendations Place unnawrap today and can change weekly at home with home health.  Can cont ancef while inpatient but change to oral keflex 500 bid at DC for a 21 days.   UTI - pseudomonas- fosfomycin given x 1 Will  give 3 days ciprofloxacin  She did not follow up with me after her admission in Sept- this will be important to continue to work on edema control.   I should 3-4 weeks   Thank you very much for the consult. Will follow with you.  Leonel Ramsay   04/04/2017, 1:40 PM

## 2017-04-04 NOTE — Consult Note (Signed)
[facility] Nurse wound consult note Reason for Consult:New order from ID to wrap in Unna's boots for edema.  Has received Vancocin and Ancef for cellulitis.  Still c/o pain 8/10.  Spoke with bedside RN, Theadora Rama who is not sure that patient will agree to compression due to pain. WOC RN is on a different campus this afternoon.  Theadora Rama is comfortable wrapping the compression, but will inquire with patient if she will allow it.   Wound type:Edema/venous insifficiency/infectious Pressure Injury POA: NA Dressing procedure/placement/frequency:Cleanse bilateral legs with soap and water and pat gently dry.  Apply zinc layer from below toes to below knee.  Secure with self adherent Coban wrap.  Change twice weekly on Tuesday and Friday.  [facility] team will follow.  [name] RN BSN [place] Pager [phone]

## 2017-04-04 NOTE — Consult Note (Signed)
   [facility] CM Inpatient Consult   04/04/2017  [name] 14-Feb-1951 [id number]   Patient is currently active with [facility] for chronic disease management services.  Patient has been engaged by a [employer] and CSW. Went by to speak with patient and discuss barriers of care. Patient mainly spoke of wanting to leave her current home situation and be in a place on her own. She stated she had been to SNF her limited amount of days this year, so feels she will be going back home. Patient talked about her options and her hopes to move out. She stated her best frien lives with her and would move with her if she was able to move. This liaison listened and offered support. Will make [facility] social worker aware of patient's concerns and hopes.  Our community based plan of care has focused on disease management and community resource support.  Patient will receive a post discharge transition of care call and will be evaluated for monthly home visits for assessments and disease process education.  Made Inpatient Case Manager aware that [facility] following. Of note, [facility] Care Management services does not replace or interfere with any services that are needed or arranged by inpatient case management or social work.  For additional questions or referrals please contact:  Dalante Minus RN, [facility] Liaison  [phone]) [place] [phone]) Toll free office

## 2017-04-04 NOTE — Clinical Social Work Note (Signed)
CSW spoke to patient and her friend Geronimo Boot [phone] regarding SNF recommendations.  Patient is in her copay days, and patient is concerned about how she will be able to pay copays.  Patient has a fixed income CSW informed her that sometimes SNFs are able to set up payment plans for her to pay for her coinsurance.  Patient's family friend asked CSW to contact [facility] and [facility] in [place], [place] spoke to both facilities and [facility] will call patient's friend to discuss payment plans if she decides to go there.  CSW also spoke to [facility], who is not accepting patients over the weekend.  CSW spoke to [facility], patient said she is not interested in going.  CSW to continue to follow patient's progress throughout discharge planning.  Jones Broom. [place], MSW, [place]  04/04/2017 5:17 PM

## 2017-04-04 NOTE — Clinical Social Work Note (Signed)
CSW received referral that patient is considering SNF even though she will be in her copay days.  CSW was given permission to begin bed search in [place].  Patient will think about, she really would rather go back home, but will consider SNF again.  CSW awaiting for bed offers.  Jones Broom. [place], MSW, [place]  04/04/2017 2:29 PM

## 2017-04-04 NOTE — Care Management (Signed)
Patient is currently being worked up for SNF.  Patient would like to return home if she is able.  Patient is open with home health PT, RN, Aide, SW through [facility].  [place] with [facility] notified of admission
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
## 2017-04-04 NOTE — Progress Notes (Signed)
[facility] NAME: [name]    MR#:  [id number]  DATE OF BIRTH:  [birth year]  SUBJECTIVE:  CHIEF COMPLAINT:   Chief Complaint  Patient presents with  . Fall   Patient awake and alert.  Keeping her legs elevated Left leg redness is improving    On 2 L O2  REVIEW OF SYSTEMS:    Review of Systems  Constitutional: Positive for malaise/fatigue. Negative for chills and fever.  HENT: Negative for sore throat.   Eyes: Negative for blurred vision, double vision and pain.  Respiratory: Negative for cough, hemoptysis, shortness of breath and wheezing.   Cardiovascular: Negative for chest pain, palpitations, orthopnea and leg swelling.  Gastrointestinal: Negative for abdominal pain, constipation, diarrhea, heartburn, nausea and vomiting.  Genitourinary: Negative for dysuria and hematuria.  Musculoskeletal: Positive for falls. Negative for back pain and joint pain.  Skin: Negative for rash.  Neurological: Positive for weakness. Negative for sensory change, speech change, focal weakness and headaches.  Endo/Heme/Allergies: Does not bruise/bleed easily.  Psychiatric/Behavioral: Positive for memory loss. Negative for depression. The patient is not nervous/anxious.     DRUG ALLERGIES:  No Known Allergies  VITALS:  Blood pressure (!) 151/68, pulse 68, temperature 97.8 F (36.6 C), temperature source Oral, resp. rate 20, height 5\' 4"  (1.626 m), weight 127 kg (280 lb), SpO2 98 %.  PHYSICAL EXAMINATION:   Physical Exam  GENERAL:  66 y.o.-year-old patient lying in the bed with no acute distress.  EYES: Pupils equal, round, reactive to light and accommodation. No scleral icterus. Extraocular muscles intact.  HEENT: Head atraumatic, normocephalic. Oropharynx and nasopharynx clear.  NECK:  Supple, no jugular venous distention. No thyroid enlargement, no tenderness.  LUNGS: Normal breath sounds bilaterally, no wheezing, rales, rhonchi. No use  of accessory muscles of respiration.  CARDIOVASCULAR: S1, S2 normal. No murmurs, rubs, or gallops.  ABDOMEN: Soft, nontender, nondistended. Bowel sounds present. No organomegaly or mass.  Foley in place EXTREMITIES: No cyanosis, clubbing or edema b/l.    NEUROLOGIC: Cranial nerves II through XII are intact.  PSYCHIATRIC: The patient is alert and awake SKIN: Redness b/l LE extremities-left lower extremity is less erythematous than right lower extremity, decreasing lower extremity edema  LABORATORY PANEL:   CBC Recent Labs  Lab 04/04/17 0506  WBC 3.6  HGB 9.7*  HCT 29.4*  PLT 191   ------------------------------------------------------------------------------------------------------------------ Chemistries  Recent Labs  Lab 04/01/17 1314  04/04/17 0506  NA 131*   < > 132*  K 3.9   < > 3.9  CL 95*   < > 101  CO2 23   < > 23  GLUCOSE 119*   < > 86  BUN 21*   < > 28*  CREATININE 1.76*   < > 1.69*  CALCIUM 9.9   < > 8.8*  AST 28  --   --   ALT 24  --   --   ALKPHOS 78  --   --   BILITOT 0.6  --   --    < > = values in this interval not displayed.   ------------------------------------------------------------------------------------------------------------------  Cardiac Enzymes Recent Labs  Lab 04/02/17 0404  TROPONINI 0.10*   ------------------------------------------------------------------------------------------------------------------  RADIOLOGY:  No results found.   ASSESSMENT AND PLAN:   [name]  is a 66 y.o. female with a known history of chronic multiple sclerosis, essential hypertension, COPD, chronic indwelling Foley catheter is presenting to the ED after she had multiple falls  #  hypotension due to severe sepsis Resolved with IV fluids Blood cultures are negative so far  #Bilateral  lower extremity cellulitis-left greater than right with sepsis POA Failed outpatient antibiotics Keflex, because of significant lower extremity edema Unna  wrap Encouraging patient to keep her legs elevated by 3-4 pillows Discussed with ID. Change to Cefazolin discharge patient home with p.o. Keflex 500 mg twice daily for 21 days and outpatient follow-up with Dr. Ola Spurr in 3-4 weeks  #Acute cystitis with abnormal urinalysis-chronic indwelling Foley catheter Foley catheter was changed in the ED during this admission No h/o any drug resistent organisms Urine culture greater than 100,000 colonies of Pseudomonas Given 1 dose of fosfomycin.  Patient will receive 3 days of ciprofloxacin and then discontinue antibiotics  #Acute kidney injury with poor by mouth intake and ATN IVF Hold HCTZ Baseline creatinine 1.2 , current creatinine trend 2.39-2.5 -1.69  #Hyponatremia from poor by mouth intake IV fluids and hold hydrochlorothiazide  #Frequent falls PT evaluation-patient ambulates very short distances  #Chronic history of multiple sclerosis  #History of B-cell lymphoma status post radiation therapy  outpatient follow-up with oncology  Physical therapy is recommending Skilled nursing facility.  Patient will talk to the family regarding her disposition  All the records are reviewed and case discussed with Care Management/Social Worker Management plans discussed with the patient, patient's friend [name] and they are in agreement.  CODE STATUS: FULL CODE  DVT Prophylaxis: SCDs  TOTAL CC TIME TAKING CARE OF THIS PATIENT: 35 minutes.   POSSIBLE D/C IN 1-2 DAYS, DEPENDING ON CLINICAL CONDITION.  Nicholes Mango M.D on 04/04/2017 at 6:54 PM  Between 7am to 6pm - Pager - [phone]  After 6pm go to [email or web address] - password [email or web address] [facility]  Office  [phone]  CC: Primary care physician; Arnetha Courser, MD  Note: This dictation was prepared with Dragon dictation along with smaller phrase technology. Any transcriptional errors that result from this process are unintentional.

## 2017-04-04 NOTE — Progress Notes (Signed)
Pharmacy Antibiotic Note  [name] is a 66 y.o. female admitted on 04/01/2017 with cellulitis.  Pharmacy has been consulted for cefazolin dosing.  Plan: Cefazolin 1 gram IV q12h for Crcl 43 ml/min.  Height: 5\' 4"  (162.6 cm) Weight: 280 lb (127 kg) IBW/kg (Calculated) : 54.7  Temp (24hrs), Avg:97.6 F (36.4 C), Min:97.5 F (36.4 C), Max:97.7 F (36.5 C)  Recent Labs  Lab 03/31/17 1535 04/01/17 1314 04/02/17 1108 04/03/17 0441 04/04/17 0506  WBC 5.1 14.8*  --  5.3 3.6  CREATININE 1.19* 1.76* 2.39* 2.51* 1.69*    Estimated Creatinine Clearance: 43.2 mL/min (A) (by C-G formula based on SCr of 1.69 mg/dL (H)).    No Known Allergies  Antimicrobials this admission: Anti-infectives (From admission, onward)   Start     Dose/Rate Route Frequency Ordered Stop   04/03/17 2200  ceFAZolin (ANCEF) IVPB 1 g/50 mL premix  Status:  Discontinued     1 g 100 mL/hr over 30 Minutes Intravenous Every 12 hours 04/03/17 1418 04/03/17 1422   04/03/17 2200  ceFAZolin (ANCEF) 1 g in dextrose 5 % 50 mL IVPB     1 g 120 mL/hr over 30 Minutes Intravenous Every 12 hours 04/03/17 1422     04/02/17 1530  ceFAZolin (ANCEF) 500 mg in dextrose 5 % 100 mL IVPB  Status:  Discontinued     500 mg 210 mL/hr over 30 Minutes Intravenous Every 12 hours 04/02/17 1524 04/03/17 1418   04/01/17 2200  sulfamethoxazole-trimethoprim (BACTRIM DS,SEPTRA DS) 800-160 MG per tablet 1 tablet  Status:  Discontinued     1 tablet Oral 2 times daily 04/01/17 1644 04/01/17 1644   04/01/17 2100  vancomycin (VANCOCIN) 1,250 mg in sodium chloride 0.9 % 250 mL IVPB  Status:  Discontinued     1,250 mg 166.7 mL/hr over 90 Minutes Intravenous Every 24 hours 04/01/17 1706 04/02/17 1510   04/01/17 1545  meropenem (MERREM) 1 g in sodium chloride 0.9 % 100 mL IVPB  Status:  Discontinued     1 g 200 mL/hr over 30 Minutes Intravenous Every 12 hours 04/01/17 1534 04/02/17 1514   04/01/17 1445  vancomycin (VANCOCIN) IVPB 1000 mg/200 mL  premix     1,000 mg 200 mL/hr over 60 Minutes Intravenous  Once 04/01/17 1438 04/01/17 1629       Dose adjustments this admission: Due to renal impairment, ancef dosing reduced, crcl 31 (Recommendation to administer 50% of usual dose every 12 hours)    Microbiology results: Recent Results (from the past 240 hour(s))  Urine Culture     Status: Abnormal   Collection Time: 04/01/17  2:45 PM  Result Value Ref Range Status   Specimen Description URINE, RANDOM  Final   Special Requests NONE  Final   Culture >=100,000 COLONIES/mL PSEUDOMONAS AERUGINOSA (A)  Final   Report Status 04/04/2017 FINAL  Final   Organism ID, Bacteria PSEUDOMONAS AERUGINOSA (A)  Final      Susceptibility   Pseudomonas aeruginosa - MIC*    CEFTAZIDIME 4 SENSITIVE Sensitive     CIPROFLOXACIN <=0.25 SENSITIVE Sensitive     GENTAMICIN 2 SENSITIVE Sensitive     IMIPENEM 2 SENSITIVE Sensitive     PIP/TAZO 8 SENSITIVE Sensitive     CEFEPIME 2 SENSITIVE Sensitive     * >=100,000 COLONIES/mL PSEUDOMONAS AERUGINOSA  CULTURE, BLOOD (ROUTINE X 2) w Reflex to ID Panel     Status: None (Preliminary result)   Collection Time: 04/01/17 10:42 PM  Result Value Ref  Range Status   Specimen Description BLOOD BLOOD RIGHT HAND  Final   Special Requests   Final    BOTTLES DRAWN AEROBIC AND ANAEROBIC Blood Culture results may not be optimal due to an excessive volume of blood received in culture bottles   Culture NO GROWTH 3 DAYS  Final   Report Status PENDING  Incomplete  CULTURE, BLOOD (ROUTINE X 2) w Reflex to ID Panel     Status: None (Preliminary result)   Collection Time: 04/01/17 10:54 PM  Result Value Ref Range Status   Specimen Description BLOOD BLOOD LEFT HAND  Final   Special Requests   Final    BOTTLES DRAWN AEROBIC AND ANAEROBIC Blood Culture results may not be optimal due to an excessive volume of blood received in culture bottles   Culture NO GROWTH 3 DAYS  Final   Report Status PENDING  Incomplete     Thank  you for allowing pharmacy to be a part of this patient's care.  [name] A 04/04/2017 11:03 AM

## 2017-04-04 NOTE — Progress Notes (Signed)
Pharmacy Antibiotic Note  [name] is a 66 y.o. female admitted on 04/01/2017 with Pseudmonas UTI.  Pharmacy has been consulted for Ciprofloxacin dosing.  Note: Patient w/ hx UTI w/ Pseudomonas, chronic indwelling Foley catheter.  ID consulted.  Plan: Will begin Cipro 500mg  PO bid for Pseudomonas UTI dosing x 3 days per ID note.  Zanaflex (Tizanidine) is contraindicated with Cipro.   Height: 5\' 4"  (162.6 cm) Weight: 280 lb (127 kg) IBW/kg (Calculated) : 54.7  Temp (24hrs), Avg:97.6 F (36.4 C), Min:97.5 F (36.4 C), Max:97.7 F (36.5 C)  Recent Labs  Lab 03/31/17 1535 04/01/17 1314 04/02/17 1108 04/03/17 0441 04/04/17 0506  WBC 5.1 14.8*  --  5.3 3.6  CREATININE 1.19* 1.76* 2.39* 2.51* 1.69*    Estimated Creatinine Clearance: 43.2 mL/min (A) (by C-G formula based on SCr of 1.69 mg/dL (H)).    No Known Allergies  Antimicrobials this admission: Cefazolin  12/5 >>   cipro 12/7 >>   Fosfomycin x 1  12/6  Dose adjustments this admission:    Microbiology results: 12/6 BCx: NG x 3d 12/4 UCx: Pseudomonas- pan sensitive    Sputum:      MRSA PCR:    Thank you for allowing pharmacy to be a part of this patient's care.  Logann Whitebread A 04/04/2017 2:33 PM

## 2017-04-05 LAB — BASIC METABOLIC PANEL
Anion gap: 10 (ref 5–15)
BUN: 23 mg/dL — ABNORMAL HIGH (ref 6–20)
CO2: 23 mmol/L (ref 22–32)
Calcium: 9.1 mg/dL (ref 8.9–10.3)
Chloride: 101 mmol/L (ref 101–111)
Creatinine, Ser: 1.42 mg/dL — ABNORMAL HIGH (ref 0.44–1.00)
GFR calc Af Amer: 44 mL/min — ABNORMAL LOW (ref >60)
GFR calc non Af Amer: 38 mL/min — ABNORMAL LOW (ref >60)
Glucose, Bld: 95 mg/dL (ref 65–99)
Potassium: 4 mmol/L (ref 3.5–5.1)
Sodium: 134 mmol/L — ABNORMAL LOW (ref 135–145)

## 2017-04-05 MED ORDER — METHOCARBAMOL 500 MG PO TABS: 500.0000 mg | ORAL_TABLET | Freq: Three times a day (TID) | ORAL | 0 refills | Status: DC | PRN

## 2017-04-05 MED ORDER — CIPROFLOXACIN HCL 500 MG PO TABS: 500.0000 mg | ORAL_TABLET | Freq: Two times a day (BID) | ORAL | 0 refills | Status: DC

## 2017-04-05 MED ORDER — CEPHALEXIN 500 MG PO CAPS: 500.0000 mg | ORAL_CAPSULE | Freq: Two times a day (BID) | ORAL | 0 refills | Status: AC

## 2017-04-05 NOTE — Discharge Instructions (Signed)
Follow-up with primary care physician in one week Follow-up with Dr. Ola Spurr in 3-4 weeks Continue chronic indwelling Foley catheter which was changed during this admission Home health to be resumed, una wraps to be changed once weekly Keep legs elevated by 3-4 pillows Outpatient follow-up with oncology as scheduled

## 2017-04-05 NOTE — Plan of Care (Signed)
  Education: Knowledge of General Education information will improve 04/05/2017 0552 - Progressing by Jeffie Pollock, RN   Clinical Measurements: Ability to maintain clinical measurements within normal limits will improve 04/05/2017 0552 - Progressing by Jeffie Pollock, RN Will remain free from infection 04/05/2017 0552 - Progressing by Jeffie Pollock, RN Diagnostic test results will improve 04/05/2017 0552 - Progressing by Jeffie Pollock, RN Respiratory complications will improve 04/05/2017 0552 - Progressing by Jeffie Pollock, RN Cardiovascular complication will be avoided 04/05/2017 0552 - Progressing by Jeffie Pollock, RN   Activity: Risk for activity intolerance will decrease 04/05/2017 0552 - Progressing by Jeffie Pollock, RN   Coping: Level of anxiety will decrease 04/05/2017 0552 - Progressing by Jeffie Pollock, RN   Pain Managment: General experience of comfort will improve 04/05/2017 0552 - Progressing by Jeffie Pollock, RN   Clinical Measurements: Ability to avoid or minimize complications of infection will improve 04/05/2017 0552 - Progressing by Jeffie Pollock, RN

## 2017-04-05 NOTE — Progress Notes (Addendum)
Patient is to be discharged today. Patient is in no acute distress at this time, and assessment is unchanged from this morning. Patient's IV is out, discharge paperwork has been discussed with patient/family and there are no questions or concerns at this time. Patient will be accompanied downstairs by staff via wheelchair to friend's car.

## 2017-04-05 NOTE — Discharge Summary (Addendum)
[facility] NAME: [name]    MR#:  [id number]  DATE OF BIRTH:  1951/04/29  DATE OF ADMISSION:  04/01/2017 ADMITTING PHYSICIAN: Nicholes Mango, MD  DATE OF DISCHARGE: 04/05/17  PRIMARY CARE PHYSICIAN: Arnetha Courser, MD    ADMISSION DIAGNOSIS:  Dehydration [E86.0] Leg swelling [M79.89] Cellulitis of left lower extremity [J00.938] Acute cystitis without hematuria [N30.00] Fall, initial encounter [W19.XXXA]  DISCHARGE DIAGNOSIS:  Active Problems:   Cellulitis  Acute cystitis SECONDARY DIAGNOSIS:   Past Medical History:  Diagnosis Date  . Abdominal aortic atherosclerosis ([place]) 11/11/2016  . ADHD   . Anxiety   . COPD (chronic obstructive pulmonary disease) ([place])   . Depression    major depressive  . Dyspnea    doe  . Edema    left leg  . Follicular lymphoma ([place])    B Cell  . Follicular lymphoma grade II ([place])   . Hypertension   . Hypotension    idiopathic  . Kyphoscoliosis and scoliosis 11/26/2011  . Morbid obesity ([place]) 01/05/2016  . Multiple sclerosis ([place])   . Multiple sclerosis ([place])    1980's  . Neuromuscular disorder ([place])   . Obstructive and reflux uropathy    foley  . Pain    atypical facial  . Peripheral vascular disease of lower extremity with ulceration ([place]) 11/08/2015  . Skin ulcer ([place]) 11/08/2015  . Weakness    generalized. has MS    HOSPITAL COURSE:   [name]  is a 66 y.o. female with a known history of chronic multiple sclerosis, essential hypertension, COPD, chronic indwelling Foley catheter is presenting to the ED after she had multiple falls. CT head is negative. Patient reports that she is losing balance as her legs are swollen, red and painful.  # hypotension due to severe sepsis Resolved with IV fluids Blood cultures are negative so far  #Bilateral lower extremity cellulitis-left greater than right with sepsis POA Failed outpatient antibiotics Keflex, because of  significant lower extremity edema Unna wrap Encouraging patient to keep her legs elevated by 3-4 pillows Discussed with ID. Change to Cefazolin discharge patient home with p.o. Keflex 500 mg twice daily for 21 days and outpatient follow-up with Dr. Ola Spurr in 3-4 weeks  #Acute cystitis with abnormal urinalysis-chronic indwelling Foley catheter Foley catheter was changed in the ED during this admission No h/o any drug resistent organisms Urine culture greater than 100,000 colonies of Pseudomonas Given 1 dose of fosfomycin.  Patient will receive 3 days of ciprofloxacin and then discontinue antibiotics  #Acute kidney injury with poor by mouth intake and ATN Improved with IV fluid Discontinued her lisinopril and hydrochlorothiazide, blood pressure is stable Baseline creatinine 1.2 , current creatinine trend 2.39-2.5 -1.69-1.29  #Hyponatremia from poor by mouth intake IV fluids and discontinue hydrochlorothiazide Sodium at 134  #Frequent falls PT evaluation-patient ambulates very short distances, prefers going home with home health  #Chronichistory of multiple sclerosis  #History of B-cell lymphoma status post radiation therapy  outpatient follow-up with oncology  #Chronic history of multiple sclerosis Discontinued tizanidine in view of daily prolongation Patient is started on Robaxin which is safer option  Physical therapy is recommending Skilled nursing facility.  Patient refused skilled nursing facility and prefers going home    DISCHARGE CONDITIONS:   Stable  CONSULTS OBTAINED:  Treatment Team:  Leonel Ramsay, MD   PROCEDURES none  DRUG ALLERGIES:  No Known Allergies  DISCHARGE MEDICATIONS:   Allergies as  of 04/05/2017   No Known Allergies     Medication List    STOP taking these medications   clindamycin 300 MG capsule Commonly known as:  CLEOCIN   fluticasone furoate-vilanterol 100-25 MCG/INH Aepb Commonly known as:  BREO ELLIPTA    hydrochlorothiazide 12.5 MG capsule Commonly known as:  MICROZIDE   lisinopril 5 MG tablet Commonly known as:  PRINIVIL,ZESTRIL   naproxen 500 MG tablet Commonly known as:  NAPROSYN   sulfamethoxazole-trimethoprim 800-160 MG tablet Commonly known as:  BACTRIM DS,SEPTRA DS   tiZANidine 4 MG tablet Commonly known as:  ZANAFLEX     TAKE these medications   BIOTIN PO Take by mouth daily. Pt taking 500 mcg daily   buPROPion 300 MG 24 hr tablet Commonly known as:  WELLBUTRIN XL Take 300 mg by mouth every morning.   cephALEXin 500 MG capsule Commonly known as:  KEFLEX Take 1 capsule (500 mg total) by mouth 2 (two) times daily for 10 days. What changed:  when to take this   ciprofloxacin 500 MG tablet Commonly known as:  CIPRO Take 1 tablet (500 mg total) by mouth 2 (two) times daily.   clonazePAM 1 MG tablet Commonly known as:  KLONOPIN Take 1 tablet (1 mg total) by mouth at bedtime.   DULoxetine 60 MG capsule Commonly known as:  CYMBALTA Take 1 capsule (60 mg total) by mouth every morning.   gabapentin 600 MG tablet Commonly known as:  NEURONTIN Take 1 tablet (600 mg total) by mouth 3 (three) times daily.   HUGGIES PULL-UPS Misc 1 each by Does not apply route 2 (two) times daily. Dx urinary incontinence, MS; LON 99 months   interferon beta-1a 30 MCG/0.5ML Pskt injection Commonly known as:  AVONEX Inject 30 mcg into the muscle every Monday.   lidocaine-prilocaine cream Commonly known as:  EMLA Apply 1 application topically as needed. apply cream over the port site and then place small piece of saran wrap over the cream to protect your clothing   methocarbamol 500 MG tablet Commonly known as:  ROBAXIN Take 1 tablet (500 mg total) by mouth every 8 (eight) hours as needed for muscle spasms.   methylphenidate 10 MG tablet Commonly known as:  RITALIN Take 1 tablet (10 mg total) by mouth 2 (two) times daily.   multivitamin with minerals Tabs tablet Take 1 tablet  by mouth daily.   mupirocin ointment 2 % Commonly known as:  BACTROBAN Apply 1 application topically 2 (two) times daily.   nystatin cream Commonly known as:  MYCOSTATIN Apply topically daily.   oxyCODONE-acetaminophen 5-325 MG tablet Commonly known as:  PERCOCET/ROXICET Take by mouth.   polyethylene glycol powder powder Commonly known as:  GLYCOLAX/MIRALAX Take 17 g by mouth daily as needed for mild constipation. Mixed in water   potassium chloride 10 MEQ tablet Commonly known as:  K-DUR TAKE ONE (1) TABLET EACH DAY   QUEtiapine Fumarate 150 MG 24 hr tablet Commonly known as:  SEROQUEL XR Take 150 mg by mouth at bedtime.   senna-docusate 8.6-50 MG tablet Commonly known as:  Senokot-S Take 1 tablet by mouth daily as needed for mild constipation.   solifenacin 10 MG tablet Commonly known as:  VESICARE Take 10 mg by mouth daily.   topiramate 50 MG tablet Commonly known as:  TOPAMAX Take 1 tablet (50 mg total) by mouth daily.   vitamin C 500 MG tablet Commonly known as:  ASCORBIC ACID Take 500 mg by mouth daily.  DISCHARGE INSTRUCTIONS:   Follow-up with primary care physician in one week Follow-up with Dr. Ola Spurr in 3-4 weeks Continue chronic indwelling Foley catheter which was changed during this admission Home health to be resumed, una wraps to be changed once weekly Keep legs elevated by 3-4 pillows Outpatient follow-up with oncology as scheduled   DIET:  Cardiac diet  DISCHARGE CONDITION:  Fair  ACTIVITY:  Activity as tolerated  OXYGEN:  Home Oxygen: No.   Oxygen Delivery: room air  DISCHARGE LOCATION:  home   If you experience worsening of your admission symptoms, develop shortness of breath, life threatening emergency, suicidal or homicidal thoughts you must seek medical attention immediately by calling 911 or calling your MD immediately  if symptoms less severe.  You Must read complete instructions/literature along with all the  possible adverse reactions/side effects for all the Medicines you take and that have been prescribed to you. Take any new Medicines after you have completely understood and accpet all the possible adverse reactions/side effects.   Please note  You were cared for by a hospitalist during your hospital stay. If you have any questions about your discharge medications or the care you received while you were in the hospital after you are discharged, you can call the unit and asked to speak with the hospitalist on call if the hospitalist that took care of you is not available. Once you are discharged, your primary care physician will handle any further medical issues. Please note that NO REFILLS for any discharge medications will be authorized once you are discharged, as it is imperative that you return to your primary care physician (or establish a relationship with a primary care physician if you do not have one) for your aftercare needs so that they can reassess your need for medications and monitor your lab values.     Today  Chief Complaint  Patient presents with  . Fall   Pt is feeling much better.refusing SNF, wants to go home with home health  ROS:  CONSTITUTIONAL: Denies fevers, chills. Denies any fatigue, weakness.  EYES: Denies blurry vision, double vision, eye pain. EARS, NOSE, THROAT: Denies tinnitus, ear pain, hearing loss. RESPIRATORY: Denies cough, wheeze, shortness of breath.  CARDIOVASCULAR: Denies chest pain, palpitations, edema.  GASTROINTESTINAL: Denies nausea, vomiting, diarrhea, abdominal pain. Denies bright red blood per rectum. GENITOURINARY: Denies dysuria, hematuria. ENDOCRINE: Denies nocturia or thyroid problems. HEMATOLOGIC AND LYMPHATIC: Denies easy bruising or bleeding. SKIN: Denies rash or lesion. MUSCULOSKELETAL: Denies pain in neck, back, shoulder, knees, hips or arthritic symptoms.  NEUROLOGIC: Denies paralysis, paresthesias.  PSYCHIATRIC: Denies anxiety or  depressive symptoms.   VITAL SIGNS:  Blood pressure 128/77, pulse 79, temperature 98.2 F (36.8 C), temperature source Oral, resp. rate 16, height 5\' 4"  (1.626 m), weight 127 kg (280 lb), SpO2 96 %.  I/O:    Intake/Output Summary (Last 24 hours) at 04/05/2017 1340 Last data filed at 04/05/2017 1333 Gross per 24 hour  Intake 780 ml  Output 1875 ml  Net -1095 ml    PHYSICAL EXAMINATION:  GENERAL:  66 y.o.-year-old patient lying in the bed with no acute distress.  EYES: Pupils equal, round, reactive to light and accommodation. No scleral icterus. Extraocular muscles intact.  HEENT: Head atraumatic, normocephalic. Oropharynx and nasopharynx clear.  NECK:  Supple, no jugular venous distention. No thyroid enlargement, no tenderness.  LUNGS: Normal breath sounds bilaterally, no wheezing, rales,rhonchi or crepitation. No use of accessory muscles of respiration.  CARDIOVASCULAR: S1, S2 normal. No murmurs, rubs,  or gallops.  ABDOMEN: Soft, non-tender, non-distended. Bowel sounds present.  EXTREMITIES: Bilateral lower extremity is wrapped in Unna boot NEUROLOGIC: Cranial nerves II through XII are intact. Muscle strength diffusely diminished in all extremities. Sensation intact. Gait not checked.  PSYCHIATRIC: The patient is alert and oriented x 3.  SKIN: No obvious rash, lesion, or ulcer.   DATA REVIEW:   CBC Recent Labs  Lab 04/04/17 0506  WBC 3.6  HGB 9.7*  HCT 29.4*  PLT 191    Chemistries  Recent Labs  Lab 04/01/17 1314  04/05/17 0454  NA 131*   < > 134*  K 3.9   < > 4.0  CL 95*   < > 101  CO2 23   < > 23  GLUCOSE 119*   < > 95  BUN 21*   < > 23*  CREATININE 1.76*   < > 1.42*  CALCIUM 9.9   < > 9.1  AST 28  --   --   ALT 24  --   --   ALKPHOS 78  --   --   BILITOT 0.6  --   --    < > = values in this interval not displayed.    Cardiac Enzymes Recent Labs  Lab 04/02/17 0404  TROPONINI 0.10*    Microbiology Results  Results for orders placed or performed  during the hospital encounter of 04/01/17  Urine Culture     Status: Abnormal   Collection Time: 04/01/17  2:45 PM  Result Value Ref Range Status   Specimen Description URINE, RANDOM  Final   Special Requests NONE  Final   Culture >=100,000 COLONIES/mL PSEUDOMONAS AERUGINOSA (A)  Final   Report Status 04/04/2017 FINAL  Final   Organism ID, Bacteria PSEUDOMONAS AERUGINOSA (A)  Final      Susceptibility   Pseudomonas aeruginosa - MIC*    CEFTAZIDIME 4 SENSITIVE Sensitive     CIPROFLOXACIN <=0.25 SENSITIVE Sensitive     GENTAMICIN 2 SENSITIVE Sensitive     IMIPENEM 2 SENSITIVE Sensitive     PIP/TAZO 8 SENSITIVE Sensitive     CEFEPIME 2 SENSITIVE Sensitive     * >=100,000 COLONIES/mL PSEUDOMONAS AERUGINOSA  CULTURE, BLOOD (ROUTINE X 2) w Reflex to ID Panel     Status: None (Preliminary result)   Collection Time: 04/01/17 10:42 PM  Result Value Ref Range Status   Specimen Description BLOOD BLOOD RIGHT HAND  Final   Special Requests   Final    BOTTLES DRAWN AEROBIC AND ANAEROBIC Blood Culture results may not be optimal due to an excessive volume of blood received in culture bottles   Culture NO GROWTH 4 DAYS  Final   Report Status PENDING  Incomplete  CULTURE, BLOOD (ROUTINE X 2) w Reflex to ID Panel     Status: None (Preliminary result)   Collection Time: 04/01/17 10:54 PM  Result Value Ref Range Status   Specimen Description BLOOD BLOOD LEFT HAND  Final   Special Requests   Final    BOTTLES DRAWN AEROBIC AND ANAEROBIC Blood Culture results may not be optimal due to an excessive volume of blood received in culture bottles   Culture NO GROWTH 4 DAYS  Final   Report Status PENDING  Incomplete    RADIOLOGY:  Dg Chest 2 View  Result Date: 04/01/2017 CLINICAL DATA:  [place] [address] while walking to kitchen. EXAM: CHEST  2 VIEW COMPARISON:  Chest radiograph February 06, 2017 FINDINGS: Cardiomediastinal silhouette is unremarkable. Similar bronchitic changes without pleural  effusion or focal  consolidation. No pneumothorax. Single lumen LEFT chest Port-A-Cath has is similarly backed out, looped in the distribution of the internal jugular vein with distal tip projecting at brachiocephalic confluence. Soft tissue planes and included osseous structures are unchanged. Similar advanced cervical facet arthropathy. IMPRESSION: Mild bronchitic changes. Similarly positioned LEFT chest Port-A-Cath with distal tip projecting at brachiocephalic confluence. Electronically Signed   By: Elon Alas M.D.   On: 04/01/2017 14:14   Ct Head Wo Contrast  Result Date: 04/01/2017 CLINICAL DATA:  Loss of balance and fall today. History of multiple sclerosis. EXAM: CT HEAD WITHOUT CONTRAST TECHNIQUE: Contiguous axial images were obtained from the base of the skull through the vertex without intravenous contrast. COMPARISON:  Head CT scan 02/06/2017. FINDINGS: Brain: Cortical atrophy and hypoattenuation in the deep white matter structures are again seen. Remote lacunar infarction in the left cerebellum is also unchanged. No evidence of acute abnormality including hemorrhage, infarct, mass lesion, mass effect, midline shift or abnormal extra-axial fluid collection. No hydrocephalus or pneumocephalus. Vascular: Atherosclerosis noted. Skull: Intact. Sinuses/Orbits: Negative. Other: None. IMPRESSION: No acute intracranial abnormality. No change in atrophy and chronic white matter disease. Electronically Signed   By: Inge Rise M.D.   On: 04/01/2017 13:58   US Venous Img Lower Bilateral  Result Date: 04/02/2017 CLINICAL DATA:  Bilateral lower extremity pain and edema for the past week. History of smoking. History of lymphoma. Evaluate for DVT. EXAM: BILATERAL LOWER EXTREMITY VENOUS DOPPLER ULTRASOUND TECHNIQUE: Gray-scale sonography with graded compression, as well as color Doppler and duplex ultrasound were performed to evaluate the lower extremity deep venous systems from the level of the common femoral vein and  including the common femoral, femoral, profunda femoral, popliteal and calf veins including the posterior tibial, peroneal and gastrocnemius veins when visible. The superficial great saphenous vein was also interrogated. Spectral Doppler was utilized to evaluate flow at rest and with distal augmentation maneuvers in the common femoral, femoral and popliteal veins. COMPARISON:  None. FINDINGS: RIGHT LOWER EXTREMITY Common Femoral Vein: No evidence of thrombus. Normal compressibility, respiratory phasicity and response to augmentation. Saphenofemoral Junction: No evidence of thrombus. Normal compressibility and flow on color Doppler imaging. Profunda Femoral Vein: No evidence of thrombus. Normal compressibility and flow on color Doppler imaging. Femoral Vein: No evidence of thrombus. Normal compressibility, respiratory phasicity and response to augmentation. Popliteal Vein: No evidence of thrombus. Normal compressibility, respiratory phasicity and response to augmentation. Calf Veins: No evidence of thrombus. Normal compressibility and flow on color Doppler imaging. Superficial Great Saphenous Vein: No evidence of thrombus. Normal compressibility. Venous Reflux:  None. Other Findings: There is a minimal subcutaneous edema at the level the right calf. LEFT LOWER EXTREMITY Common Femoral Vein: No evidence of thrombus. Normal compressibility, respiratory phasicity and response to augmentation. Saphenofemoral Junction: No evidence of thrombus. Normal compressibility and flow on color Doppler imaging. Profunda Femoral Vein: No evidence of thrombus. Normal compressibility and flow on color Doppler imaging. Femoral Vein: No evidence of thrombus. Normal compressibility, respiratory phasicity and response to augmentation. Popliteal Vein: No evidence of thrombus. Normal compressibility, respiratory phasicity and response to augmentation. Calf Veins: No evidence of thrombus. Normal compressibility and flow on color Doppler  imaging. Superficial Great Saphenous Vein: No evidence of thrombus. Normal compressibility. Venous Reflux:  None. Other Findings: There is a minimal amount of subcutaneous edema at the level the left calf. IMPRESSION: No evidence of DVT within either lower extremity. Electronically Signed   By: Eldridge Abrahams.D.  On: 04/02/2017 09:47    EKG:   Orders placed or performed during the hospital encounter of 04/01/17  . ED EKG  . ED EKG      Management plans discussed with the patient, family and they are in agreement.  CODE STATUS:     Code Status Orders  (From admission, onward)        Start     Ordered   04/01/17 1724  Full code  Continuous     04/01/17 1723    Code Status History    Date Active Date Inactive Code Status Order ID Comments User Context   02/15/2017 17:15 02/17/2017 20:08 Full Code [id number]  Henreitta Leber, MD Inpatient   02/06/2017 23:56 02/09/2017 18:58 Full Code [id number]  Dustin Flock, MD Inpatient   01/02/2017 19:03 01/06/2017 18:48 Full Code [id number]  [name], MD ED   12/29/2016 12:52 12/30/2016 17:22 Full Code [id number]  Nicholes Mango, MD Inpatient   10/11/2016 23:20 10/14/2016 17:26 Full Code [id number]  Theodoro Grist, MD Inpatient   08/28/2016 11:59 08/30/2016 19:32 Full Code [id number]  Loletha Grayer, MD ED   12/18/2015 19:56 12/19/2015 19:39 Full Code [id number]  Hower, Aaron Mose, MD ED      TOTAL TIME TAKING CARE OF THIS PATIENT: 45  minutes.   Note: This dictation was prepared with Dragon dictation along with smaller phrase technology. Any transcriptional errors that result from this process are unintentional.   @[facility] @  on 04/05/2017 at 1:40 PM  Between 7am to 6pm - Pager - [phone]  After 6pm go to [email or web address] - password [email or web address] [facility]  Office  [phone]  CC: Primary care physician; Arnetha Courser, MD

## 2017-04-05 NOTE — Care Management Note (Signed)
Case Management Note  Patient Details  Name: [name] MRN: [id number] Date of Birth: October 28, 1950  Subjective/Objective:   Resumption of care for HH=PT, RN, OT, SW, Aide called to [facility].                  Action/Plan:   Expected Discharge Date:  04/05/17               Expected Discharge Plan:     In-House Referral:     Discharge planning Services     Post Acute Care Choice:    Choice offered to:     DME Arranged:    DME Agency:     HH Arranged:    HH Agency:     Status of Service:     If discussed at [employer], dates discussed:    Additional Comments:  Braven Wolk A, RN 04/05/2017, 12:54 PM

## 2017-04-06 LAB — CULTURE, BLOOD (ROUTINE X 2)
Culture: NO GROWTH
Culture: NO GROWTH

## 2017-04-07 ENCOUNTER — Other Ambulatory Visit: Payer: Self-pay | Admitting: *Deleted

## 2017-04-07 NOTE — Patient Outreach (Signed)
Unsuccessful telephone encounter to [name], 66 year old female - follow up on recent hospitalization December 4-8, 2018 for Left lower extremity cellulitis, dehydration, leg swelling, fall, hypotension. In patient PT recommended SNF, pt declined, to discharge home with home health.  Pt had 6 admits in 6 months.   This RN CM was following pt for transition of care following SNF discharge on 02/27/17, admitted to rehab after  October 20-22,2018 hospitalization for bilateral LE cellulitis.  Unable to leave a voice message on pt's phone as voice message not set up.    Plan:  RN CM to follow up again tomorrow telephonically.    [name].   [facility]  [phone]

## 2017-04-08 ENCOUNTER — Telehealth: Payer: Self-pay | Admitting: Family Medicine

## 2017-04-08 ENCOUNTER — Encounter: Payer: Self-pay | Admitting: *Deleted

## 2017-04-08 ENCOUNTER — Other Ambulatory Visit: Payer: Self-pay | Admitting: *Deleted

## 2017-04-08 DIAGNOSIS — S92351D Displaced fracture of fifth metatarsal bone, right foot, subsequent encounter for fracture with routine healing: Secondary | ICD-10-CM | POA: Diagnosis not present

## 2017-04-08 DIAGNOSIS — I2729 Other secondary pulmonary hypertension: Secondary | ICD-10-CM | POA: Diagnosis not present

## 2017-04-08 DIAGNOSIS — S81812D Laceration without foreign body, left lower leg, subsequent encounter: Secondary | ICD-10-CM | POA: Diagnosis not present

## 2017-04-08 DIAGNOSIS — G35 Multiple sclerosis: Secondary | ICD-10-CM | POA: Diagnosis not present

## 2017-04-08 DIAGNOSIS — N183 Chronic kidney disease, stage 3 (moderate): Secondary | ICD-10-CM | POA: Diagnosis not present

## 2017-04-08 DIAGNOSIS — I129 Hypertensive chronic kidney disease with stage 1 through stage 4 chronic kidney disease, or unspecified chronic kidney disease: Secondary | ICD-10-CM | POA: Diagnosis not present

## 2017-04-08 DIAGNOSIS — I700 Atherosclerosis of aorta: Secondary | ICD-10-CM | POA: Diagnosis not present

## 2017-04-08 DIAGNOSIS — M5416 Radiculopathy, lumbar region: Secondary | ICD-10-CM | POA: Diagnosis not present

## 2017-04-08 DIAGNOSIS — I739 Peripheral vascular disease, unspecified: Secondary | ICD-10-CM | POA: Diagnosis not present

## 2017-04-08 DIAGNOSIS — J449 Chronic obstructive pulmonary disease, unspecified: Secondary | ICD-10-CM | POA: Diagnosis not present

## 2017-04-08 DIAGNOSIS — M1990 Unspecified osteoarthritis, unspecified site: Secondary | ICD-10-CM | POA: Diagnosis not present

## 2017-04-08 NOTE — Telephone Encounter (Signed)
Patient was released from the hospital without a Rx for potassium. Please can they get that taken care of today. [facility] is the pharmacy of choice.

## 2017-04-08 NOTE — Telephone Encounter (Signed)
Left detailed voicemail

## 2017-04-08 NOTE — Patient Outreach (Addendum)
Successful telephone encounter to [name], 66 year old female, follow up on recent hospitalization December 4-8,2018 for Left lower extremity cellulitis, dehydration, leg swelling, fall (initial encounter), hypotension.   Inpatient PT recommended SNF, pt declined, discharged home with home health.   Pt had 6 admits in 6 months.  This RN CM was following  pt for transition of care recent SNF discharge on 02/27/17, admitted to rehab after October 20-22,2018 hospitalization for bilateral lower extremity cellulitis.   Spoke with pt, HIPAA identifiers verified.  Pt reports doing good since discharge home, they wrapped my legs with Unna boots, want to take them off/had nerve spasms yesterday.  Pt reports elevates legs to which RN CM reinforced elevating per discharge orders  with 3-4 pillows, keep edema down to which pt voiced understanding.  Pt reports no pain in legs/just uncomfortable.   Pt reports taking all of her medications, [name] (friend, lives with pt) did her pill planner according to discharge papers, requested RN CM to talk to her to review medications. Spoke with [name],pt's  medications reviewed which [name] reports no prescription for Potassium was given/on discharge papers plus pt is out of Senokot.   ** Patient was recently discharged from hospital and all medications have been reviewed.  As discussed, [name] to call PCP office to inquire about Potassium ordered for pt  post discharge.   Spoke with pt again, discussed ongoing follow up for transition of care, scheduling home visit to which pt agreed.   Plan:  As discussed with pt, plan to follow up again next week for home visit.            As discussed, pt to call PCP and Dr. Blane Ohara office to confirm follow up             Appointments.             Care plan updated.    Zara Chess.   [facility]  [phone]

## 2017-04-08 NOTE — Telephone Encounter (Signed)
Her last three potassium levels in the hospital were normal Note on medicine to ask PCP about this I don't see that she is on a diuretic to warrant potassium prescription Please make sure she has hospital f/u appt this week or Monday; thank you

## 2017-04-09 ENCOUNTER — Telehealth: Payer: Self-pay | Admitting: Family Medicine

## 2017-04-09 DIAGNOSIS — I2729 Other secondary pulmonary hypertension: Secondary | ICD-10-CM | POA: Diagnosis not present

## 2017-04-09 DIAGNOSIS — J449 Chronic obstructive pulmonary disease, unspecified: Secondary | ICD-10-CM | POA: Diagnosis not present

## 2017-04-09 DIAGNOSIS — I700 Atherosclerosis of aorta: Secondary | ICD-10-CM | POA: Diagnosis not present

## 2017-04-09 DIAGNOSIS — S92351D Displaced fracture of fifth metatarsal bone, right foot, subsequent encounter for fracture with routine healing: Secondary | ICD-10-CM | POA: Diagnosis not present

## 2017-04-09 DIAGNOSIS — N183 Chronic kidney disease, stage 3 (moderate): Secondary | ICD-10-CM | POA: Diagnosis not present

## 2017-04-09 DIAGNOSIS — M1990 Unspecified osteoarthritis, unspecified site: Secondary | ICD-10-CM | POA: Diagnosis not present

## 2017-04-09 DIAGNOSIS — M5416 Radiculopathy, lumbar region: Secondary | ICD-10-CM | POA: Diagnosis not present

## 2017-04-09 DIAGNOSIS — G35 Multiple sclerosis: Secondary | ICD-10-CM | POA: Diagnosis not present

## 2017-04-09 DIAGNOSIS — I739 Peripheral vascular disease, unspecified: Secondary | ICD-10-CM | POA: Diagnosis not present

## 2017-04-09 DIAGNOSIS — S81812D Laceration without foreign body, left lower leg, subsequent encounter: Secondary | ICD-10-CM | POA: Diagnosis not present

## 2017-04-09 DIAGNOSIS — I129 Hypertensive chronic kidney disease with stage 1 through stage 4 chronic kidney disease, or unspecified chronic kidney disease: Secondary | ICD-10-CM | POA: Diagnosis not present

## 2017-04-09 NOTE — Telephone Encounter (Signed)
Stephanie notified from [facility] of verbal orders.  Please call patient and schedule hospital f/u

## 2017-04-09 NOTE — Telephone Encounter (Signed)
Copied from [facility] #[id number]. Topic: Quick Communication - See Telephone Encounter >> Apr 09, 2017  1:09 PM Boyd Kerbs wrote: CRM for notification. See Telephone encounter for:  Colletta Maryland from [facility]  [phone]  needs verbal order for occupational therapy 1 x 1week and  2 x 4 weeks.   04/09/17.

## 2017-04-09 NOTE — Telephone Encounter (Signed)
This encounter was created in error - please disregard.

## 2017-04-09 NOTE — Telephone Encounter (Signed)
Please make sure patient has hospital follow-up appointment scheduled Okay for orders, but I will need to see her soon

## 2017-04-10 NOTE — Telephone Encounter (Signed)
LVM for pt to call and schedule a hosp fu

## 2017-04-14 ENCOUNTER — Other Ambulatory Visit: Payer: Self-pay | Admitting: *Deleted

## 2017-04-14 NOTE — Patient Outreach (Signed)
[facility]) Care Management  04/14/2017  [name] 1951-04-15 [id number]  EMMI red flag alert from 12/14. Reason : General discharge follow up Coping :  Sad, hopeless/anxious, and  lost interest in things    Placed call to patient , explained reason for the follow up call.  Patient reports she is feeling better on today in her spirits, discussed her friend [name] is present and has just completed helping her with taking a shower and that always helps her feel better.  Patient recalled conversation with [facility] social worker on today and patient plans to remain living with her husband and having her friend [name] available to help her.   Patient denies any other concerns at this time.   Plan Will update assigned care manager of follow up phone call, confirmed patient has [facility] main line and 24 hour nurse line number, explained to patient to expect follow up call from assigned care manger or covering case manger later in week.   Joylene Draft, RN, [place] Management Coordinator  [phone]- Mobile [phone]- Toll Free Main Office

## 2017-04-14 NOTE — Patient Outreach (Signed)
[facility]) Care Management  04/14/2017  [name] 24-Feb-1951 [id number]   Patient referred to this social worker to explore housing options due to coonflicts with her spouse. Per patient, "things are going alright". Patient states that she is residing with husband out of "necessity" after things did not work out between her and her son. Patient denies any physical abuse and states that she does not plan on moving at this time. ALF and Independent Living options discussed. Patient declined ALF placement and states that she has had 2 past experiences in Independent Living. Patient states that she still has her friend [name] who assist with care giving. Patient has no plans to move at this time. Patient's safety emphasized  patient encouraged to call this social worker if assistance is needed in the future to find alternative housing.   [name] [facility] Care Management [phone]

## 2017-04-15 ENCOUNTER — Telehealth: Payer: Self-pay | Admitting: Family Medicine

## 2017-04-15 DIAGNOSIS — S81812D Laceration without foreign body, left lower leg, subsequent encounter: Secondary | ICD-10-CM | POA: Diagnosis not present

## 2017-04-15 DIAGNOSIS — I739 Peripheral vascular disease, unspecified: Secondary | ICD-10-CM | POA: Diagnosis not present

## 2017-04-15 DIAGNOSIS — M1990 Unspecified osteoarthritis, unspecified site: Secondary | ICD-10-CM | POA: Diagnosis not present

## 2017-04-15 DIAGNOSIS — I700 Atherosclerosis of aorta: Secondary | ICD-10-CM | POA: Diagnosis not present

## 2017-04-15 DIAGNOSIS — I2729 Other secondary pulmonary hypertension: Secondary | ICD-10-CM | POA: Diagnosis not present

## 2017-04-15 DIAGNOSIS — S92351D Displaced fracture of fifth metatarsal bone, right foot, subsequent encounter for fracture with routine healing: Secondary | ICD-10-CM | POA: Diagnosis not present

## 2017-04-15 DIAGNOSIS — J449 Chronic obstructive pulmonary disease, unspecified: Secondary | ICD-10-CM | POA: Diagnosis not present

## 2017-04-15 DIAGNOSIS — N183 Chronic kidney disease, stage 3 (moderate): Secondary | ICD-10-CM | POA: Diagnosis not present

## 2017-04-15 DIAGNOSIS — G35 Multiple sclerosis: Secondary | ICD-10-CM | POA: Diagnosis not present

## 2017-04-15 DIAGNOSIS — I129 Hypertensive chronic kidney disease with stage 1 through stage 4 chronic kidney disease, or unspecified chronic kidney disease: Secondary | ICD-10-CM | POA: Diagnosis not present

## 2017-04-15 DIAGNOSIS — M5416 Radiculopathy, lumbar region: Secondary | ICD-10-CM | POA: Diagnosis not present

## 2017-04-15 NOTE — Telephone Encounter (Signed)
[name], [facility] [place] Nurse called stating that the pt is beginning to have cellulitis in the left leg. Home Health nurse wants to know if something could be called in or if pt would need to be seen in the office. Leroy Kennedy can be contacted at [phone].

## 2017-04-16 ENCOUNTER — Other Ambulatory Visit: Payer: Self-pay | Admitting: *Deleted

## 2017-04-16 NOTE — Telephone Encounter (Signed)
Yes, patient needs an appointment please, right away She has still not been seen for hospital follow-up Thank you

## 2017-04-16 NOTE — Telephone Encounter (Signed)
I'm thinking appointment is needed do you agree? Please advise.  Thanks!

## 2017-04-16 NOTE — Patient Outreach (Signed)
[facility]) Care Management  04/16/2017  [name] April 07, 1951 [id number]  Transition of care call   66 year old female, follow up on recent hospitalization December 4-8,2018 for Left lower extremity cellulitis, dehydration, leg swelling, fall (initial encounter), hypotension.   Inpatient PT recommended SNF, pt declined, discharged home with home health.   Pt had 6 admits in 6 months.  This RN CM was following  pt for transition of care recent SNF discharge on 02/27/17, admitted to rehab after October 20-22,2018 hospitalization for bilateral lower extremity cellulitis.   Spoke with pt, HIPAA identifiers verified.    Explained that I am covering for Rose Pierzchala assigned RNCM, patient reports she is doing okay.  Discussed home health RN visited on yesterday noted, redness in legs and increased swelling reports they wrapped them in [place]. Patient reports [facility] notified MD regarding concern for redness, patient reports she has appointment with PCP on next week.  Discussed with patient elevating legs her legs  as tolerated, to help with swelling. Patient confirmed that her caregiver, [name] is still helping her with organizing daily medications.   Patient denies any other concerns at this time, reinforced attending PCP visit on next week.  Discussed again with patient that home visited scheduled with primary RNCM will need to be rescheduled and Rose will contact her in the next week.   Plan Will continue with weekly transition of care outreaches, assigned care manager will contact by phone regarding rescheduling home visit in the next week.   Joylene Draft, RN, [place] Management Coordinator  [phone]- Mobile [phone]- Toll Free Main Office

## 2017-04-17 DIAGNOSIS — S81812D Laceration without foreign body, left lower leg, subsequent encounter: Secondary | ICD-10-CM | POA: Diagnosis not present

## 2017-04-17 DIAGNOSIS — M5416 Radiculopathy, lumbar region: Secondary | ICD-10-CM | POA: Diagnosis not present

## 2017-04-17 DIAGNOSIS — N183 Chronic kidney disease, stage 3 (moderate): Secondary | ICD-10-CM | POA: Diagnosis not present

## 2017-04-17 DIAGNOSIS — J449 Chronic obstructive pulmonary disease, unspecified: Secondary | ICD-10-CM | POA: Diagnosis not present

## 2017-04-17 DIAGNOSIS — I129 Hypertensive chronic kidney disease with stage 1 through stage 4 chronic kidney disease, or unspecified chronic kidney disease: Secondary | ICD-10-CM | POA: Diagnosis not present

## 2017-04-17 DIAGNOSIS — I739 Peripheral vascular disease, unspecified: Secondary | ICD-10-CM | POA: Diagnosis not present

## 2017-04-17 DIAGNOSIS — I700 Atherosclerosis of aorta: Secondary | ICD-10-CM | POA: Diagnosis not present

## 2017-04-17 DIAGNOSIS — I2729 Other secondary pulmonary hypertension: Secondary | ICD-10-CM | POA: Diagnosis not present

## 2017-04-17 DIAGNOSIS — S92351D Displaced fracture of fifth metatarsal bone, right foot, subsequent encounter for fracture with routine healing: Secondary | ICD-10-CM | POA: Diagnosis not present

## 2017-04-17 DIAGNOSIS — G35 Multiple sclerosis: Secondary | ICD-10-CM | POA: Diagnosis not present

## 2017-04-17 DIAGNOSIS — M1990 Unspecified osteoarthritis, unspecified site: Secondary | ICD-10-CM | POA: Diagnosis not present

## 2017-04-17 NOTE — Telephone Encounter (Signed)
Left two voice messages on 04/17/17 @ 4:51pm one for [facility] [phone] and patient Valyncia Wiens cell # [phone] stating that Shamonique needs an appointment as soon as possible.

## 2017-04-17 NOTE — Telephone Encounter (Signed)
Called Christi ([facility] nurse) no answer. LM for nurse informing her of the need for an urgent appt for this pt to be seen. CRM created.

## 2017-04-18 ENCOUNTER — Telehealth: Payer: Self-pay

## 2017-04-18 ENCOUNTER — Ambulatory Visit: Payer: Self-pay | Admitting: *Deleted

## 2017-04-18 ENCOUNTER — Telehealth: Payer: Self-pay | Admitting: Oncology

## 2017-04-18 DIAGNOSIS — S92351D Displaced fracture of fifth metatarsal bone, right foot, subsequent encounter for fracture with routine healing: Secondary | ICD-10-CM | POA: Diagnosis not present

## 2017-04-18 DIAGNOSIS — M1990 Unspecified osteoarthritis, unspecified site: Secondary | ICD-10-CM | POA: Diagnosis not present

## 2017-04-18 DIAGNOSIS — I2729 Other secondary pulmonary hypertension: Secondary | ICD-10-CM | POA: Diagnosis not present

## 2017-04-18 DIAGNOSIS — S81812D Laceration without foreign body, left lower leg, subsequent encounter: Secondary | ICD-10-CM | POA: Diagnosis not present

## 2017-04-18 DIAGNOSIS — J449 Chronic obstructive pulmonary disease, unspecified: Secondary | ICD-10-CM | POA: Diagnosis not present

## 2017-04-18 DIAGNOSIS — M5416 Radiculopathy, lumbar region: Secondary | ICD-10-CM | POA: Diagnosis not present

## 2017-04-18 DIAGNOSIS — N183 Chronic kidney disease, stage 3 (moderate): Secondary | ICD-10-CM | POA: Diagnosis not present

## 2017-04-18 DIAGNOSIS — G35 Multiple sclerosis: Secondary | ICD-10-CM | POA: Diagnosis not present

## 2017-04-18 DIAGNOSIS — I700 Atherosclerosis of aorta: Secondary | ICD-10-CM | POA: Diagnosis not present

## 2017-04-18 DIAGNOSIS — I739 Peripheral vascular disease, unspecified: Secondary | ICD-10-CM | POA: Diagnosis not present

## 2017-04-18 DIAGNOSIS — I129 Hypertensive chronic kidney disease with stage 1 through stage 4 chronic kidney disease, or unspecified chronic kidney disease: Secondary | ICD-10-CM | POA: Diagnosis not present

## 2017-04-18 NOTE — Telephone Encounter (Signed)
Copied from [place] [id number]. Topic: Quick Communication - See Telephone Encounter >> Apr 17, 2017 10:35 AM Gordy Clement, [place] wrote: CRM for notification. See Telephone encounter for:  04/17/17.   Notes   [name] 04/17/2017 04:56 PM  Summary: urgent appt    The nurse for [name] called to say that she has a full day of activities and therapy tomorrow and the appt on the 26th should be fine, it not contact pt to inform them to come in sooner, but nurse staff is aware of the urgent appt message

## 2017-04-18 NOTE — Telephone Encounter (Signed)
Port Flush with Labs/MD on 05/09/17 @ 1:15/1:45, per Sherry/Schd msg. Patient is aware. Appt also mailed.MF

## 2017-04-21 DIAGNOSIS — I739 Peripheral vascular disease, unspecified: Secondary | ICD-10-CM | POA: Diagnosis not present

## 2017-04-21 DIAGNOSIS — M5416 Radiculopathy, lumbar region: Secondary | ICD-10-CM | POA: Diagnosis not present

## 2017-04-21 DIAGNOSIS — I2729 Other secondary pulmonary hypertension: Secondary | ICD-10-CM | POA: Diagnosis not present

## 2017-04-21 DIAGNOSIS — J449 Chronic obstructive pulmonary disease, unspecified: Secondary | ICD-10-CM | POA: Diagnosis not present

## 2017-04-21 DIAGNOSIS — S92351D Displaced fracture of fifth metatarsal bone, right foot, subsequent encounter for fracture with routine healing: Secondary | ICD-10-CM | POA: Diagnosis not present

## 2017-04-21 DIAGNOSIS — S81812D Laceration without foreign body, left lower leg, subsequent encounter: Secondary | ICD-10-CM | POA: Diagnosis not present

## 2017-04-21 DIAGNOSIS — M1990 Unspecified osteoarthritis, unspecified site: Secondary | ICD-10-CM | POA: Diagnosis not present

## 2017-04-21 DIAGNOSIS — G35 Multiple sclerosis: Secondary | ICD-10-CM | POA: Diagnosis not present

## 2017-04-21 DIAGNOSIS — I700 Atherosclerosis of aorta: Secondary | ICD-10-CM | POA: Diagnosis not present

## 2017-04-21 DIAGNOSIS — N183 Chronic kidney disease, stage 3 (moderate): Secondary | ICD-10-CM | POA: Diagnosis not present

## 2017-04-21 DIAGNOSIS — I129 Hypertensive chronic kidney disease with stage 1 through stage 4 chronic kidney disease, or unspecified chronic kidney disease: Secondary | ICD-10-CM | POA: Diagnosis not present

## 2017-04-23 ENCOUNTER — Other Ambulatory Visit: Payer: Self-pay

## 2017-04-23 ENCOUNTER — Encounter: Payer: Self-pay | Admitting: Family Medicine

## 2017-04-23 ENCOUNTER — Emergency Department
Admission: EM | Admit: 2017-04-23 | Discharge: 2017-04-23 | Disposition: A | Discharge status: Home / Self Care | Payer: Medicare Other | Attending: Emergency Medicine | Admitting: Emergency Medicine

## 2017-04-23 ENCOUNTER — Ambulatory Visit (INDEPENDENT_AMBULATORY_CARE_PROVIDER_SITE_OTHER): Payer: Medicare Other | Admitting: Family Medicine

## 2017-04-23 VITALS — BP 160/78 | HR 88 | Temp 98.3°F

## 2017-04-23 DIAGNOSIS — C8293 Follicular lymphoma, unspecified, intra-abdominal lymph nodes: Secondary | ICD-10-CM

## 2017-04-23 DIAGNOSIS — Z79899 Other long term (current) drug therapy: Secondary | ICD-10-CM | POA: Insufficient documentation

## 2017-04-23 DIAGNOSIS — N183 Chronic kidney disease, stage 3 (moderate): Secondary | ICD-10-CM | POA: Insufficient documentation

## 2017-04-23 DIAGNOSIS — J449 Chronic obstructive pulmonary disease, unspecified: Secondary | ICD-10-CM | POA: Insufficient documentation

## 2017-04-23 DIAGNOSIS — F4325 Adjustment disorder with mixed disturbance of emotions and conduct: Secondary | ICD-10-CM | POA: Diagnosis not present

## 2017-04-23 DIAGNOSIS — N319 Neuromuscular dysfunction of bladder, unspecified: Secondary | ICD-10-CM

## 2017-04-23 DIAGNOSIS — G35 Multiple sclerosis: Secondary | ICD-10-CM

## 2017-04-23 DIAGNOSIS — R45850 Homicidal ideations: Secondary | ICD-10-CM | POA: Diagnosis not present

## 2017-04-23 DIAGNOSIS — R454 Irritability and anger: Secondary | ICD-10-CM | POA: Insufficient documentation

## 2017-04-23 DIAGNOSIS — Z87891 Personal history of nicotine dependence: Secondary | ICD-10-CM | POA: Diagnosis not present

## 2017-04-23 DIAGNOSIS — L03116 Cellulitis of left lower limb: Secondary | ICD-10-CM

## 2017-04-23 DIAGNOSIS — I129 Hypertensive chronic kidney disease with stage 1 through stage 4 chronic kidney disease, or unspecified chronic kidney disease: Secondary | ICD-10-CM | POA: Insufficient documentation

## 2017-04-23 DIAGNOSIS — G35D Multiple sclerosis, unspecified: Secondary | ICD-10-CM

## 2017-04-23 DIAGNOSIS — A419 Sepsis, unspecified organism: Secondary | ICD-10-CM

## 2017-04-23 NOTE — Progress Notes (Signed)
BP (!) 160/78 (BP Location: Right Arm, Patient Position: Sitting, Cuff Size: Large)   Pulse 88   Temp 98.3 F (36.8 C) (Oral)   SpO2 98%    Subjective:    Patient ID: [name], female    DOB: 1950/06/10, 66 y.o.   MRN: [id number]  HPI: [name] is a 66 y.o. female  Chief Complaint  Patient presents with  . Hospitalization Follow-up  . Depression    HPI Patient is here for hospital follow-up She was admitted to the hospital on April 01, 2017 Reviewed admission H&P She was discharged on April 05, 2017 Reviewed discharge summary; admission diagnoses included dehydration, leg swelling, cellulitis of the left lower extremity; acute cystitis without hematuria, and fall; active discharge problems included cellulitis and cystitis; she had hypotension due to severe sepsis; lisinopril and HCTZ were stopped She was discharged home on BID Keflex 500 mg for 21 days and was supposed to follow-up with Dr. Ola Spurr in 3-4 weeks She had her Foley catheter changed in the ER prior to admission and received one dose of fosfomycin and then 3 days of cipro For her falls and MS, the tizanidine was stopped and started on robaxin, a safer alternative  No falls since leaving the hospital PT is helping She has lower leg weakness; still weak and working with PT OT is also coming out to help with upper body exercises; upper body getting better She needs a sock helper, unna boot is RN  No diarrhea, no fevers since leaving the hospital BP was high in the hospital, and now high No chest pain Indwelling catheter changed monthly  She sees Dr. Kasandra Knudsen for her mental health; daughter thinks coming off tizanidine is helping Family issues at home; [facility] was going to meet with her, but never showed on 04/18/17; patient would like a place of her own; she doesn't hae anywhere to put her stuff; no where to sit and fix her hair; no hair dryer; has thousands of dollars of hair care stuff, scissors, mirrors,  combs all in a storage shed; he says there is no room in his house for her stuff; he won't let her have a wheelchair in his house; she has a hoveround in the storage, but no room for that at the current residence; small house Patient then said, "I think I could shoot Jackquline Bosch dead and call his daddy to come get his body" RB is not bad to her, but says things that are hurtfful [name] is worried about her mental health She is exhibiting homicidal thoughts and feelings [name] says she is aware of what she is saying Patient says she asked Dr. Kasandra Knudsen before if she could kill her husband and get away with it; not her current husband, a former husband; she has not seen Dr. Kasandra Knudsen in months There are multiple guns in the home; too many to count she says; these are in gun safes and the husband has the key  After the physician had finished the visit, the husband appeared at the front window and talked to the office manager; he says that the patient "goes off" sometimes and wanted [place] to know there are guns in the home  Depression screen [facility] 2/9 04/23/2017 03/03/2017 02/04/2017 02/03/2017 11/11/2016  Decreased Interest 1 1 1 1 1   Down, Depressed, Hopeless 3 1 1 3 3   PHQ - 2 Score 4 2 2 4 4   Altered sleeping 1 0 0 1 -  Tired, decreased energy 3  2 3 3 3   Change in appetite 0 1 - 1 1  Feeling bad or failure about yourself  3 0 0 - 1  Trouble concentrating - 0 0 1 1  Moving slowly or fidgety/restless 2 0 1 0 1  Suicidal thoughts 0 0 0 0 0  PHQ-9 Score 13 5 6 10  -  Difficult doing work/chores - Somewhat difficult Not difficult at all Somewhat difficult Somewhat difficult  Some recent data might be hidden   Relevant past medical, surgical, family and social history reviewed Past Medical History:  Diagnosis Date  . Abdominal aortic atherosclerosis ([place]) 11/11/2016  . ADHD   . Anxiety   . COPD (chronic obstructive pulmonary disease) ([place])   . Depression    major depressive  . Dyspnea    doe  . Edema    left  leg  . Follicular lymphoma ([place])    B Cell  . Follicular lymphoma grade II ([place])   . Hypertension   . Hypotension    idiopathic  . Kyphoscoliosis and scoliosis 11/26/2011  . Morbid obesity ([place]) 01/05/2016  . Multiple sclerosis ([place])   . Multiple sclerosis ([place])    1980's  . Neuromuscular disorder ([place])   . Obstructive and reflux uropathy    foley  . Pain    atypical facial  . Peripheral vascular disease of lower extremity with ulceration ([place]) 11/08/2015  . Skin ulcer ([place]) 11/08/2015  . Weakness    generalized. has MS   Past Surgical History:  Procedure Laterality Date  . BACK SURGERY N/A 2002  . CYST EXCISION     lower back  . INGUINAL LYMPH NODE BIOPSY Left 07/04/2016   Procedure: INGUINAL LYMPH NODE BIOPSY;  Surgeon: Christene Lye, MD;  Location: [facility] ORS;  Service: General;  Laterality: Left;  . PORTACATH PLACEMENT N/A 07/22/2016   Procedure: INSERTION PORT-A-CATH;  Surgeon: Christene Lye, MD;  Location: [facility] ORS;  Service: General;  Laterality: N/A;  . TONSILLECTOMY AND ADENOIDECTOMY    . TUBAL LIGATION     Family History  Problem Relation Age of Onset  . COPD Mother   . Diabetes Mother   . Heart failure Mother   . Alcohol abuse Father   . Kidney disease Father   . Kidney failure Father   . Arthritis Sister   . CAD Maternal Grandmother   . Stroke Maternal Grandfather   . Arthritis Sister   . Mental illness Sister   . Arthritis Brother    Social History   Tobacco Use  . Smoking status: Former Smoker    Packs/day: 1.00    Years: 20.00    Pack years: 20.00    Types: Cigarettes    Last attempt to quit: 02/03/2016    Years since quitting: 1.2  . Smokeless tobacco: Never Used  Substance Use Topics  . Alcohol use: No    Alcohol/week: 0.0 oz  . Drug use: Yes    Types: Marijuana    Comment: smokes THC daily per pt     Interim medical history since last visit reviewed. Allergies and medications reviewed  Review of Systems Per HPI unless  specifically indicated above     Objective:    BP (!) 160/78 (BP Location: Right Arm, Patient Position: Sitting, Cuff Size: Large)   Pulse 88   Temp 98.3 F (36.8 C) (Oral)   SpO2 98%   Wt Readings from Last 3 Encounters:  04/23/17 290 lb (131.5 kg)  04/01/17 280 lb (127 kg)  03/31/17 300 lb (136.1 kg)    Physical Exam  Constitutional: She appears well-developed and well-nourished. No distress.  Obese, weight not measured today; seated in wheelchair; urinary catheter w/bag; no supplemental oxygen; appears deconditioned; nontoxic  HENT:  Scab on the scalp  Eyes: No scleral icterus.  Neck: No thyromegaly present.  Cardiovascular: Normal rate and regular rhythm.  Pulmonary/Chest: Effort normal and breath sounds normal. No respiratory distress. She has no wheezes.  Abdominal: Bowel sounds are normal. She exhibits no distension.  Musculoskeletal: She exhibits no edema (wearing anti-embolism stockings).       Left lower leg: She exhibits swelling.  Mild erythema and very mildly increased warmth of the left leg along the anterior aspect; no skin breakdown; no oozing; no peau d'orange changes of the leg; unna boot removed for exam, obviously  Neurological:  Seated in wheelchair, gait not assessed  Skin: She is not diaphoretic. No pallor.  Psychiatric: She does not exhibit a depressed mood.  Not tearful, good eye contact with examiner, affect somewhat blunted   Results for orders placed or performed during the hospital encounter of 04/01/17  Urine Culture  Result Value Ref Range   Specimen Description URINE, RANDOM    Special Requests NONE    Culture >=100,000 COLONIES/mL PSEUDOMONAS AERUGINOSA (A)    Report Status 04/04/2017 FINAL    Organism ID, Bacteria PSEUDOMONAS AERUGINOSA (A)       Susceptibility   Pseudomonas aeruginosa - MIC*    CEFTAZIDIME 4 SENSITIVE Sensitive     CIPROFLOXACIN <=0.25 SENSITIVE Sensitive     GENTAMICIN 2 SENSITIVE Sensitive     IMIPENEM 2 SENSITIVE  Sensitive     PIP/TAZO 8 SENSITIVE Sensitive     CEFEPIME 2 SENSITIVE Sensitive     * >=100,000 COLONIES/mL PSEUDOMONAS AERUGINOSA  CULTURE, BLOOD (ROUTINE X 2) w Reflex to ID Panel  Result Value Ref Range   Specimen Description BLOOD BLOOD RIGHT HAND    Special Requests      BOTTLES DRAWN AEROBIC AND ANAEROBIC Blood Culture results may not be optimal due to an excessive volume of blood received in culture bottles   Culture NO GROWTH 5 DAYS    Report Status 04/06/2017 FINAL   CULTURE, BLOOD (ROUTINE X 2) w Reflex to ID Panel  Result Value Ref Range   Specimen Description BLOOD BLOOD LEFT HAND    Special Requests      BOTTLES DRAWN AEROBIC AND ANAEROBIC Blood Culture results may not be optimal due to an excessive volume of blood received in culture bottles   Culture NO GROWTH 5 DAYS    Report Status 04/06/2017 FINAL   CBC with Differential  Result Value Ref Range   WBC 14.8 (H) 3.6 - 11.0 K/uL   RBC 4.15 3.80 - 5.20 MIL/uL   Hemoglobin 12.2 12.0 - 16.0 g/dL   HCT 36.6 35.0 - 47.0 %   MCV 88.3 80.0 - 100.0 fL   MCH 29.5 26.0 - 34.0 pg   MCHC 33.4 32.0 - 36.0 g/dL   RDW 17.2 (H) 11.5 - 14.5 %   Platelets 242 150 - 440 K/uL   Neutrophils Relative % 95 %   Neutro Abs 14.1 (H) 1.4 - 6.5 K/uL   Lymphocytes Relative 1 %   Lymphs Abs 0.1 (L) 1.0 - 3.6 K/uL   Monocytes Relative 3 %   Monocytes Absolute 0.4 0.2 - 0.9 K/uL   Eosinophils Relative 1 %   Eosinophils Absolute 0.1 0 - 0.7 K/uL   Basophils  Relative 0 %   Basophils Absolute 0.1 0 - 0.1 K/uL  Comprehensive metabolic panel  Result Value Ref Range   Sodium 131 (L) 135 - 145 mmol/L   Potassium 3.9 3.5 - 5.1 mmol/L   Chloride 95 (L) 101 - 111 mmol/L   CO2 23 22 - 32 mmol/L   Glucose, Bld 119 (H) 65 - 99 mg/dL   BUN 21 (H) 6 - 20 mg/dL   Creatinine, Ser 1.76 (H) 0.44 - 1.00 mg/dL   Calcium 9.9 8.9 - 10.3 mg/dL   Total Protein 7.4 6.5 - 8.1 g/dL   Albumin 4.0 3.5 - 5.0 g/dL   AST 28 15 - 41 U/L   ALT 24 14 - 54 U/L    Alkaline Phosphatase 78 38 - 126 U/L   Total Bilirubin 0.6 0.3 - 1.2 mg/dL   GFR calc non Af Amer 29 (L) >60 mL/min   GFR calc Af Amer 34 (L) >60 mL/min   Anion gap 13 5 - 15  Troponin I  Result Value Ref Range   Troponin I 0.04 (HH) <0.03 ng/mL  Urinalysis, Complete w Microscopic  Result Value Ref Range   Color, Urine YELLOW (A) YELLOW   APPearance HAZY (A) CLEAR   Specific Gravity, Urine 1.021 1.005 - 1.030   pH 6.0 5.0 - 8.0   Glucose, UA NEGATIVE NEGATIVE mg/dL   Hgb urine dipstick NEGATIVE NEGATIVE   Bilirubin Urine NEGATIVE NEGATIVE   Ketones, ur NEGATIVE NEGATIVE mg/dL   Protein, ur 100 (A) NEGATIVE mg/dL   Nitrite POSITIVE (A) NEGATIVE   Leukocytes, UA LARGE (A) NEGATIVE   RBC / HPF 0-5 0 - 5 RBC/hpf   WBC, UA TOO NUMEROUS TO COUNT 0 - 5 WBC/hpf   Bacteria, UA RARE (A) NONE SEEN   Squamous Epithelial / LPF 0-5 (A) NONE SEEN   Mucus PRESENT   Troponin I (q 6hr x 3)  Result Value Ref Range   Troponin I 0.09 (HH) <0.03 ng/mL  Troponin I (q 6hr x 3)  Result Value Ref Range   Troponin I 0.11 (HH) <0.03 ng/mL  Troponin I (q 6hr x 3)  Result Value Ref Range   Troponin I 0.10 (HH) <0.03 ng/mL  Glucose, capillary  Result Value Ref Range   Glucose-Capillary 74 65 - 99 mg/dL  Urinalysis, Complete w Microscopic  Result Value Ref Range   Color, Urine YELLOW (A) YELLOW   APPearance HAZY (A) CLEAR   Specific Gravity, Urine 1.023 1.005 - 1.030   pH 5.0 5.0 - 8.0   Glucose, UA NEGATIVE NEGATIVE mg/dL   Hgb urine dipstick NEGATIVE NEGATIVE   Bilirubin Urine NEGATIVE NEGATIVE   Ketones, ur NEGATIVE NEGATIVE mg/dL   Protein, ur 30 (A) NEGATIVE mg/dL   Nitrite NEGATIVE NEGATIVE   Leukocytes, UA MODERATE (A) NEGATIVE   RBC / HPF 0-5 0 - 5 RBC/hpf   WBC, UA TOO NUMEROUS TO COUNT 0 - 5 WBC/hpf   Bacteria, UA NONE SEEN NONE SEEN   Squamous Epithelial / LPF 0-5 (A) NONE SEEN   WBC Clumps PRESENT    Ca Oxalate Crys, UA PRESENT   CK  Result Value Ref Range   Total CK 156 38 -  234 U/L  Creatinine, serum  Result Value Ref Range   Creatinine, Ser 2.39 (H) 0.44 - 1.00 mg/dL   GFR calc non Af Amer 20 (L) >60 mL/min   GFR calc Af Amer 23 (L) >60 mL/min  Basic metabolic panel  Result Value  Ref Range   Sodium 131 (L) 135 - 145 mmol/L   Potassium 4.4 3.5 - 5.1 mmol/L   Chloride 97 (L) 101 - 111 mmol/L   CO2 24 22 - 32 mmol/L   Glucose, Bld 101 (H) 65 - 99 mg/dL   BUN 37 (H) 6 - 20 mg/dL   Creatinine, Ser 2.51 (H) 0.44 - 1.00 mg/dL   Calcium 8.9 8.9 - 10.3 mg/dL   GFR calc non Af Amer 19 (L) >60 mL/min   GFR calc Af Amer 22 (L) >60 mL/min   Anion gap 10 5 - 15  CBC with Differential/Platelet  Result Value Ref Range   WBC 5.3 3.6 - 11.0 K/uL   RBC 3.30 (L) 3.80 - 5.20 MIL/uL   Hemoglobin 9.9 (L) 12.0 - 16.0 g/dL   HCT 29.7 (L) 35.0 - 47.0 %   MCV 90.2 80.0 - 100.0 fL   MCH 30.1 26.0 - 34.0 pg   MCHC 33.4 32.0 - 36.0 g/dL   RDW 17.3 (H) 11.5 - 14.5 %   Platelets 169 150 - 440 K/uL   Neutrophils Relative % 79 %   Neutro Abs 4.2 1.4 - 6.5 K/uL   Lymphocytes Relative 5 %   Lymphs Abs 0.3 (L) 1.0 - 3.6 K/uL   Monocytes Relative 8 %   Monocytes Absolute 0.4 0.2 - 0.9 K/uL   Eosinophils Relative 7 %   Eosinophils Absolute 0.3 0 - 0.7 K/uL   Basophils Relative 1 %   Basophils Absolute 0.1 0 - 0.1 K/uL  Basic metabolic panel  Result Value Ref Range   Sodium 132 (L) 135 - 145 mmol/L   Potassium 3.9 3.5 - 5.1 mmol/L   Chloride 101 101 - 111 mmol/L   CO2 23 22 - 32 mmol/L   Glucose, Bld 86 65 - 99 mg/dL   BUN 28 (H) 6 - 20 mg/dL   Creatinine, Ser 1.69 (H) 0.44 - 1.00 mg/dL   Calcium 8.8 (L) 8.9 - 10.3 mg/dL   GFR calc non Af Amer 30 (L) >60 mL/min   GFR calc Af Amer 35 (L) >60 mL/min   Anion gap 8 5 - 15  CBC  Result Value Ref Range   WBC 3.6 3.6 - 11.0 K/uL   RBC 3.29 (L) 3.80 - 5.20 MIL/uL   Hemoglobin 9.7 (L) 12.0 - 16.0 g/dL   HCT 29.4 (L) 35.0 - 47.0 %   MCV 89.5 80.0 - 100.0 fL   MCH 29.5 26.0 - 34.0 pg   MCHC 33.0 32.0 - 36.0 g/dL   RDW 16.8  (H) 11.5 - 14.5 %   Platelets 191 150 - 440 K/uL  Basic metabolic panel  Result Value Ref Range   Sodium 134 (L) 135 - 145 mmol/L   Potassium 4.0 3.5 - 5.1 mmol/L   Chloride 101 101 - 111 mmol/L   CO2 23 22 - 32 mmol/L   Glucose, Bld 95 65 - 99 mg/dL   BUN 23 (H) 6 - 20 mg/dL   Creatinine, Ser 1.42 (H) 0.44 - 1.00 mg/dL   Calcium 9.1 8.9 - 10.3 mg/dL   GFR calc non Af Amer 38 (L) >60 mL/min   GFR calc Af Amer 44 (L) >60 mL/min   Anion gap 10 5 - 15      Assessment & Plan:   Problem List Items Addressed This Visit      Nervous and Auditory   Multiple sclerosis ([place])    Contributing to overall weakness and  deconditioning; f/u with neurologist        Immune and Lymphatic   Follicular lymphoma of intra-abdominal lymph nodes ([place])    Follow-up with heme-onc and radiation oncologist; will send copy of note to see if mass effect on venous return playing a part in her recurrent swelling and cellulitis; would intravascular stent of some sort to allow for better venous return from the left lower extremity be feasible?        Other   Sepsis ([place])    Blood pressure medicines were stopped in the hospital; she was sent over to the ER      Homicidal ideation - Primary    Patient verbalized today her desire to kill a person by name, method, and I therefore talked with her and contacted police and EMS to have her transported to the hospital for psychiatric evaluation; it is unfortunate that she feels this way and want her to get help, but also have to take the other person's safety into account      Cellulitis of left lower leg    Keep appointment with infectious disease specialist      Bladder neurogenesis    Urinary catheter changed in the ER; to be done monthly; indwelling Foley         Follow up plan: No Follow-up on file.  An after-visit summary was printed and given to the patient at [place].  Please see the patient instructions which may contain other information and  recommendations beyond what is mentioned above in the assessment and plan.  Face-to-face time with patient was more than 40 minutes, >50% time spent counseling and coordination of care

## 2017-04-23 NOTE — ED Notes (Signed)

## 2017-04-23 NOTE — ED Notes (Signed)
[place] - psychiatry is currently consulting with her

## 2017-04-23 NOTE — Discharge Instructions (Signed)
You have been seen in the emergency department for a  psychiatric concern. Please follow-up with your outpatient resources provided. Return to the emergency department for any worsening symptoms, or any thoughts of hurting yourself or anyone else so that we may attempt to help you. 

## 2017-04-23 NOTE — Consult Note (Signed)
[facility] Consult   Reason for Consult: Consult for 67 year old woman who was referred to the emergency room from her doctor's office Referring Physician: Paduchowski Patient Identification: [name] MRN:  [id number] Principal Diagnosis: Adjustment disorder with mixed disturbance of emotions and conduct Diagnosis:   Patient Active Problem List   Diagnosis Date Noted  . Adjustment disorder with mixed disturbance of emotions and conduct [F43.25] 04/23/2017  . Sepsis ([place]) [A41.9] 02/06/2017  . Foot pain, bilateral [id number], M79.672] 02/03/2017  . Tinea pedis [B35.3] 02/03/2017  . Lymphoma ([place]) [C85.90] 01/07/2017  . Pseudomonas urinary tract infection [N39.0, B96.5] 01/02/2017  . Cellulitis and abscess of leg [L03.119, L02.419] 12/29/2016  . Left ovarian cyst [N83.202] 12/09/2016  . Abdominal aortic atherosclerosis ([place]) [I70.0] 11/11/2016  . Cellulitis of left lower extremity [L03.116] 10/11/2016  . Swelling of lower extremity [M79.89] 10/11/2016  . Obstructive sleep apnea [G47.33] 10/11/2016  . Bilateral lower leg cellulitis [L03.116, L03.115] 10/11/2016  . Follicular lymphoma of intra-abdominal lymph nodes ([place]) [C82.93] 08/06/2016  . Multiple falls [R29.6] 06/07/2016  . Inguinal adenopathy [R59.0] 05/29/2016  . Breast cancer screening [Z12.31] 01/05/2016  . Obesity, morbid ([place]) [E66.01] 01/05/2016  . Pressure ulcer [L89.90] 12/19/2015  . Low HDL (under 40) [E78.6] 12/19/2015  . Cellulitis [L03.90] 12/18/2015  . Skin ulcer ([place]) [L98.499] 11/08/2015  . Peripheral vascular disease of lower extremity with ulceration ([place]) [I73.9, L97.909] 11/08/2015  . CKD (chronic kidney disease) stage 3, GFR 30-59 ml/min (HCC) [N18.3] 11/08/2015  . Decubitus ulcer of left thigh, stage 2 [L89.222] 04/18/2015  . Constipation due to pain medication [K59.03] 01/31/2015  . Obstructive sleep apnea of adult [G47.33] 01/13/2015  . Pelvic muscle wasting [N81.84] 01/13/2015  .  Incomplete bladder emptying [R33.9] 01/13/2015  . Headache, migraine [G43.909] 10/24/2014  . Bladder neurogenesis [N31.9] 10/24/2014  . Current tobacco use [Z72.0] 10/24/2014  . Lumbar radiculopathy, chronic [M54.16] 10/02/2013  . COPD with bronchial hyperresponsiveness ([place]) [J44.1] 10/02/2013  . Major depressive disorder, recurrent, in partial remission ([place]) [F33.41] 10/02/2013  . Hypertension goal BP (blood pressure) < 140/90 [I10] 10/02/2013  . Multiple sclerosis ([place]) [G35] 10/02/2013  . Major depression in remission ([place]) [F32.5] 10/02/2013  . Absence of bladder continence [R32] 09/25/2012  . Acontractile bladder [N31.9] 02/12/2012  . Bladder retention [R33.9] 02/12/2012  . Narrowing of intervertebral disc space [M99.79] 11/26/2011  . Kyphoscoliosis and scoliosis [M41.9] 11/26/2011    Total Time spent with patient: 1 hour  Subjective:   [name] is a 66 y.o. female patient admitted with "I said something stupid".  HPI: Patient interviewed chart reviewed.  Case reviewed with emergency room physician.  Old notes reviewed.  66 year old woman with multiple chronic medical problems went to her doctor's office today for a follow-up appointment after a recent hospitalization for cellulitis.  During the conversation with the doctor the patient says she made a statement to the effect of "sometimes I get so mad at my son and I could shoot him".  This was the reason they sent her to the emergency room.  The patient was cooperative and pleasant during the interview.  She freely admits making the statement.  She says she has absolutely no intention or thought of hurting her son or anyone else.  She does not have any access to firearms.  She does not live with her son or anywhere really near.  She did talk a bit about how her mood has been down more the last few days.  Christmas was hard yesterday  because she did not get to visit her family.  She has been feeling a little more sad and run down.   Sleep is fine.  Appetite is fine.  Denies any hallucinations or psychotic symptoms.  Denies any suicidal or homicidal thoughts.  Admits that she uses marijuana pretty much every day but does not drink or use any other drugs.  She is compliant with medicines prescribed by her outpatient psychiatrist.  Social history: Patient lives with her husband although they are technically separated.  She talks quite a bit about how she dislikes the situation because she does not like her husband but living together produces a better standard of living for both of them.  It sounds like it has been a difficult situation.  She has a son who lives in [place].  She holds some resentment against him because apparently a few years ago he moved her close to him with the promise that he would take care of her and then changed his mind and said he could not take care of her and she had to move back to [place].  Medical history: Patient has multiple sclerosis.  Recovering from cellulitis.  Overweight.  Multiple chronic medical problems  Substance abuse history: No history of treatment.  Denies alcohol abuse.  Says she uses marijuana pretty much every day does not see it is a problem.  Past Psychiatric History: Patient has no history of psychiatric admissions.  No history of suicide attempts or self injury no history of violence.  Has been diagnosed with depression and anxiety and takes antidepressants and clonazepam from her psychiatric doctor.  No history of violent behavior or psychosis  Risk to Self: Is patient at risk for suicide?: No Risk to Others:   Prior Inpatient Therapy:   Prior Outpatient Therapy:    Past Medical History:  Past Medical History:  Diagnosis Date  . Abdominal aortic atherosclerosis ([place]) 11/11/2016  . ADHD   . Anxiety   . COPD (chronic obstructive pulmonary disease) ([place])   . Depression    major depressive  . Dyspnea    doe  . Edema    left leg  . Follicular lymphoma ([place])    B Cell  .  Follicular lymphoma grade II ([place])   . Hypertension   . Hypotension    idiopathic  . Kyphoscoliosis and scoliosis 11/26/2011  . Morbid obesity ([place]) 01/05/2016  . Multiple sclerosis ([place])   . Multiple sclerosis ([place])    1980's  . Neuromuscular disorder ([place])   . Obstructive and reflux uropathy    foley  . Pain    atypical facial  . Peripheral vascular disease of lower extremity with ulceration ([place]) 11/08/2015  . Skin ulcer ([place]) 11/08/2015  . Weakness    generalized. has MS    Past Surgical History:  Procedure Laterality Date  . BACK SURGERY N/A 2002  . CYST EXCISION     lower back  . INGUINAL LYMPH NODE BIOPSY Left 07/04/2016   Procedure: INGUINAL LYMPH NODE BIOPSY;  Surgeon: Christene Lye, MD;  Location: [facility] ORS;  Service: General;  Laterality: Left;  . PORTACATH PLACEMENT N/A 07/22/2016   Procedure: INSERTION PORT-A-CATH;  Surgeon: Christene Lye, MD;  Location: [facility] ORS;  Service: General;  Laterality: N/A;  . TONSILLECTOMY AND ADENOIDECTOMY    . TUBAL LIGATION     Family History:  Family History  Problem Relation Age of Onset  . COPD Mother   . Diabetes Mother   . Heart failure  Mother   . Alcohol abuse Father   . Kidney disease Father   . Kidney failure Father   . Arthritis Sister   . CAD Maternal Grandmother   . Stroke Maternal Grandfather   . Arthritis Sister   . Mental illness Sister   . Arthritis Brother    Family Psychiatric  History: She has a sister who has bipolar disorder Social History:  Social History   Substance and Sexual Activity  Alcohol Use No  . Alcohol/week: 0.0 oz     Social History   Substance and Sexual Activity  Drug Use Yes  . Types: Marijuana   Comment: smokes THC daily per pt     Social History   Socioeconomic History  . Marital status: Married    Spouse name: Not on file  . Number of children: 3  . Years of education: Not on file  . Highest education level: Not on file  Social Needs  . Financial resource  strain: Not on file  . Food insecurity - worry: Not on file  . Food insecurity - inability: Not on file  . Transportation needs - medical: Not on file  . Transportation needs - non-medical: Not on file  Occupational History  . Occupation: disabled  Tobacco Use  . Smoking status: Former Smoker    Packs/day: 1.00    Years: 20.00    Pack years: 20.00    Types: Cigarettes    Last attempt to quit: 02/03/2016    Years since quitting: 1.2  . Smokeless tobacco: Never Used  Substance and Sexual Activity  . Alcohol use: No    Alcohol/week: 0.0 oz  . Drug use: Yes    Types: Marijuana    Comment: smokes THC daily per pt   . Sexual activity: No  Other Topics Concern  . Not on file  Social History Narrative  . Not on file   Additional Social History:    Allergies:  No Known Allergies  Labs: No results found for this or any previous visit (from the past 48 hour(s)).  No current facility-administered medications for this encounter.    Current Outpatient Medications  Medication Sig Dispense Refill  . BIOTIN PO Take by mouth daily. Pt taking 500 mcg daily    . buPROPion (WELLBUTRIN XL) 300 MG 24 hr tablet Take 300 mg by mouth every morning.     . clonazePAM (KLONOPIN) 1 MG tablet Take 1 tablet (1 mg total) by mouth at bedtime. 5 tablet 0  . Diapers & Supplies (HUGGIES PULL-UPS) MISC 1 each by Does not apply route 2 (two) times daily. Dx urinary incontinence, MS; LON 99 months 60 each 11  . DULoxetine (CYMBALTA) 60 MG capsule Take 1 capsule (60 mg total) by mouth every morning. 30 capsule 3  . gabapentin (NEURONTIN) 600 MG tablet Take 1 tablet (600 mg total) by mouth 3 (three) times daily.    . interferon beta-1a (AVONEX) 30 MCG/0.5ML PSKT injection Inject 30 mcg into the muscle every Monday.    . lidocaine-prilocaine (EMLA) cream Apply 1 application topically as needed. apply cream over the port site and then place small piece of saran wrap over the cream to protect your clothing 30 g 1    . methocarbamol (ROBAXIN) 500 MG tablet Take 1 tablet (500 mg total) by mouth every 8 (eight) hours as needed for muscle spasms. 50 tablet 0  . methylphenidate (RITALIN) 10 MG tablet Take 1 tablet (10 mg total) by mouth 2 (two) times daily.  30 tablet 0  . Multiple Vitamin (MULTIVITAMIN WITH MINERALS) TABS tablet Take 1 tablet by mouth daily.    . mupirocin ointment (BACTROBAN) 2 % Apply 1 application topically 2 (two) times daily. (Patient not taking: Reported on 04/23/2017) 22 g 0  . nystatin cream (MYCOSTATIN) Apply topically daily. (Patient not taking: Reported on 04/23/2017) 30 g 0  . polyethylene glycol powder (GLYCOLAX/MIRALAX) powder Take 17 g by mouth daily as needed for mild constipation. Mixed in water    . QUEtiapine Fumarate (SEROQUEL XR) 150 MG 24 hr tablet Take 150 mg by mouth at bedtime.   4  . senna-docusate (SENOKOT-S) 8.6-50 MG tablet Take 1 tablet by mouth daily as needed for mild constipation.     . topiramate (TOPAMAX) 50 MG tablet Take 1 tablet (50 mg total) by mouth daily. 30 tablet 0  . vitamin C (ASCORBIC ACID) 500 MG tablet Take 500 mg by mouth daily.     Facility-Administered Medications Ordered in Other Encounters  Medication Dose Route Frequency Provider Last Rate Last Dose  . heparin lock flush 100 unit/mL  500 Units Intracatheter Once PRN Sindy Guadeloupe, MD        Musculoskeletal: Strength & Muscle Tone: decreased Gait & Station: unsteady Patient leans: N/A  Psychiatric Specialty Exam: Physical Exam  Nursing note and vitals reviewed. Constitutional: She appears well-developed and well-nourished.    HENT:  Head: Normocephalic and atraumatic.  Eyes: Conjunctivae are normal. Pupils are equal, round, and reactive to light.  Neck: Normal range of motion.  Cardiovascular: Regular rhythm and normal heart sounds.  Respiratory: Effort normal. No respiratory distress.  GI: Soft.  Genitourinary:     Musculoskeletal: Normal range of motion.  Neurological:  She is alert.  Skin: Skin is warm and dry.     Psychiatric: She has a normal mood and affect. Her speech is normal and behavior is normal. Judgment and thought content normal. Cognition and memory are normal.    Review of Systems  HENT: Negative.   Eyes: Negative.   Respiratory: Negative.   Cardiovascular: Negative.   Gastrointestinal: Negative.   Musculoskeletal: Negative.   Skin: Positive for rash.  Neurological: Positive for weakness.  Psychiatric/Behavioral: Positive for depression and substance abuse. Negative for hallucinations, memory loss and suicidal ideas. The patient is not nervous/anxious and does not have insomnia.     Blood pressure (!) 161/85, pulse 76, temperature 98.1 F (36.7 C), temperature source Oral, resp. rate 17, height 5\' 4"  (1.626 m), weight 290 lb (131.5 kg), SpO2 99 %.Body mass index is 49.78 kg/m.  General Appearance: Casual  Eye Contact:  Fair  Speech:  Normal Rate  Volume:  Normal  Mood:  Dysphoric  Affect:  Appropriate  Thought Process:  Goal Directed  Orientation:  Full (Time, Place, and Person)  Thought Content:  Logical  Suicidal Thoughts:  No  Homicidal Thoughts:  No  Memory:  Immediate;   Good Recent;   Fair Remote;   Fair  Judgement:  Fair  Insight:  Fair  Psychomotor Activity:  Decreased  Concentration:  Concentration: Fair  Recall:  [employer] of Knowledge:  Fair  Language:  Fair  Akathisia:  No  Handed:  Right  AIMS (if indicated):     Assets:  Communication Skills Desire for Improvement Housing Resilience  ADL's:  Intact  Cognition:  WNL  Sleep:        Treatment Plan Summary: Plan 66 year old woman who was referred to come to the emergency room because  of making a statement about killing her son.  It seems quite clear from talking with her that the patient does not have any actual intention or plan of killing her son.  She has no means to do it no history of violence and clearly understands the consequences of violent  behavior.  It sounds like she has mixed feelings about her son's behavior and regret that she does not have a better current lifestyle.  Offered her some supportive counseling but I really think it would be a good idea for her to call her outpatient psychiatrist's office and see about seeing him again or getting into seeing a therapist.  No need for any new prescriptions or change to medicine.  No need for involuntary commitment.  Patient can be discharged home.  Does not meet commitment criteria.  Case reviewed with emergency room physician and TTS.  Disposition: No evidence of imminent risk to self or others at present.   Patient does not meet criteria for psychiatric inpatient admission.  Alethia Berthold, MD 04/23/2017 4:38 PM

## 2017-04-23 NOTE — ED Provider Notes (Signed)
[facility] Emergency Department Provider Note  Time seen: 4:02 PM  I have reviewed the triage vital signs and the nursing notes.   HISTORY  Chief Complaint No chief complaint on file.    HPI [name] is a 66 y.o. female with a past medical history of ADHD, anxiety, COPD, depression, hypertension, morbid obesity, MS, generalized weakness, presents to the emergency department for homicidal ideation.  According to EMS the patient was at [facility] for a routine follow-up after discharge for cellulitis recently.  During the evaluation the patient mentioned to the physician that she wants to kill her son.  Here the patient states she was just upset because her son never visits, and states she would never actually hurt him or anyone else.  Denies any suicidal ideation.  Patient states she was just upset when she said it.  Patient has no medical complaints today.  Patient does have chronic Foley catheter due to MS.    Past Medical History:  Diagnosis Date  . Abdominal aortic atherosclerosis ([place]) 11/11/2016  . ADHD   . Anxiety   . COPD (chronic obstructive pulmonary disease) ([place])   . Depression    major depressive  . Dyspnea    doe  . Edema    left leg  . Follicular lymphoma ([place])    B Cell  . Follicular lymphoma grade II ([place])   . Hypertension   . Hypotension    idiopathic  . Kyphoscoliosis and scoliosis 11/26/2011  . Morbid obesity ([place]) 01/05/2016  . Multiple sclerosis ([place])   . Multiple sclerosis ([place])    1980's  . Neuromuscular disorder ([place])   . Obstructive and reflux uropathy    foley  . Pain    atypical facial  . Peripheral vascular disease of lower extremity with ulceration ([place]) 11/08/2015  . Skin ulcer ([place]) 11/08/2015  . Weakness    generalized. has MS    Patient Active Problem List   Diagnosis Date Noted  . Sepsis ([place]) 02/06/2017  . Foot pain, bilateral 02/03/2017  . Tinea pedis 02/03/2017  . Lymphoma ([place]) 01/07/2017  .  Pseudomonas urinary tract infection 01/02/2017  . Cellulitis and abscess of leg 12/29/2016  . Left ovarian cyst 12/09/2016  . Abdominal aortic atherosclerosis ([place]) 11/11/2016  . Cellulitis of left lower extremity 10/11/2016  . Swelling of lower extremity 10/11/2016  . Obstructive sleep apnea 10/11/2016  . Bilateral lower leg cellulitis 10/11/2016  . Follicular lymphoma of intra-abdominal lymph nodes ([place]) 08/06/2016  . Multiple falls 06/07/2016  . Inguinal adenopathy 05/29/2016  . Breast cancer screening 01/05/2016  . Obesity, morbid ([place]) 01/05/2016  . Pressure ulcer 12/19/2015  . Low HDL (under 40) 12/19/2015  . Cellulitis 12/18/2015  . Skin ulcer ([place]) 11/08/2015  . Peripheral vascular disease of lower extremity with ulceration ([place]) 11/08/2015  . CKD (chronic kidney disease) stage 3, GFR 30-59 ml/min (HCC) 11/08/2015  . Decubitus ulcer of left thigh, stage 2 04/18/2015  . Constipation due to pain medication 01/31/2015  . Obstructive sleep apnea of adult 01/13/2015  . Pelvic muscle wasting 01/13/2015  . Incomplete bladder emptying 01/13/2015  . Headache, migraine 10/24/2014  . 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 Current tobacco use 10/24/2014  . Lumbar radiculopathy, chronic 10/02/2013  . COPD with bronchial hyperresponsiveness ([place]) 10/02/2013  . Major depressive disorder, recurrent, in partial remission ([place]) 10/02/2013  . Hypertension goal BP (blood pressure) < 140/90 10/02/2013  . Multiple sclerosis ([place]) 10/02/2013  . Major depression in remission ([place]) 10/02/2013  .  Absence of bladder continence 09/25/2012  . Acontractile bladder 02/12/2012  . Bladder retention 02/12/2012  . Narrowing of intervertebral disc space 11/26/2011  . Kyphoscoliosis and scoliosis 11/26/2011    Past Surgical History:  Procedure Laterality Date  . BACK SURGERY N/A 2002  . CYST EXCISION     lower back  . INGUINAL LYMPH NODE BIOPSY Left 07/04/2016   Procedure: INGUINAL LYMPH NODE BIOPSY;   Surgeon: Christene Lye, MD;  Location: [facility] ORS;  Service: General;  Laterality: Left;  . PORTACATH PLACEMENT N/A 07/22/2016   Procedure: INSERTION PORT-A-CATH;  Surgeon: Christene Lye, MD;  Location: [facility] ORS;  Service: General;  Laterality: N/A;  . TONSILLECTOMY AND ADENOIDECTOMY    . TUBAL LIGATION      Prior to Admission medications   Medication Sig Start Date End Date Taking? Authorizing Provider  BIOTIN PO Take by mouth daily. Pt taking 500 mcg daily    [provider]  buPROPion (WELLBUTRIN XL) 300 MG 24 hr tablet Take 300 mg by mouth every morning.  05/28/16   [provider]  clonazePAM (KLONOPIN) 1 MG tablet Take 1 tablet (1 mg total) by mouth at bedtime. 01/06/17   [name], MD  Diapers & Supplies (HUGGIES PULL-UPS) MISC 1 each by Does not apply route 2 (two) times daily. Dx urinary incontinence, MS; LON 99 months 02/03/17   Arnetha Courser, MD  DULoxetine (CYMBALTA) 60 MG capsule Take 1 capsule (60 mg total) by mouth every morning. 10/14/16   Vaughan Basta, MD  gabapentin (NEURONTIN) 600 MG tablet Take 1 tablet (600 mg total) by mouth 3 (three) times daily. 02/09/17   Bettey Costa, MD  interferon beta-1a (AVONEX) 30 MCG/0.5ML PSKT injection Inject 30 mcg into the muscle every Monday.    [provider]  lidocaine-prilocaine (EMLA) cream Apply 1 application topically as needed. apply cream over the port site and then place small piece of saran wrap over the cream to protect your clothing 11/07/16   Sindy Guadeloupe, MD  methocarbamol (ROBAXIN) 500 MG tablet Take 1 tablet (500 mg total) by mouth every 8 (eight) hours as needed for muscle spasms. 04/05/17   Nicholes Mango, MD  methylphenidate (RITALIN) 10 MG tablet Take 1 tablet (10 mg total) by mouth 2 (two) times daily. 10/14/16   Vaughan Basta, MD  Multiple Vitamin (MULTIVITAMIN WITH MINERALS) TABS tablet Take 1 tablet by mouth daily.    [provider]  mupirocin ointment  (BACTROBAN) 2 % Apply 1 application topically 2 (two) times daily. Patient not taking: Reported on 04/23/2017 03/25/17   Arnetha Courser, MD  nystatin cream (MYCOSTATIN) Apply topically daily. Patient not taking: Reported on 04/23/2017 02/09/17   Bettey Costa, MD  polyethylene glycol powder (GLYCOLAX/MIRALAX) powder Take 17 g by mouth daily as needed for mild constipation. Mixed in water 01/06/17   Sudini, Alveta Heimlich, MD  QUEtiapine Fumarate (SEROQUEL XR) 150 MG 24 hr tablet Take 150 mg by mouth at bedtime.     [provider]  senna-docusate (SENOKOT-S) 8.6-50 MG tablet Take 1 tablet by mouth daily as needed for mild constipation.     [provider]  topiramate (TOPAMAX) 50 MG tablet Take 1 tablet (50 mg total) by mouth daily. 10/14/16   Vaughan Basta, MD  vitamin C (ASCORBIC ACID) 500 MG tablet Take 500 mg by mouth daily.    [provider]    No Known Allergies  Family History  Problem Relation Age of Onset  . COPD Mother   .  Diabetes Mother   . Heart failure Mother   . Alcohol abuse Father   . Kidney disease Father   . Kidney failure Father   . Arthritis Sister   . CAD Maternal Grandmother   . Stroke Maternal Grandfather   . Arthritis Sister   . Mental illness Sister   . Arthritis Brother     Social History Social History   Tobacco Use  . Smoking status: Former Smoker    Packs/day: 1.00    Years: 20.00    Pack years: 20.00    Types: Cigarettes    Last attempt to quit: 02/03/2016    Years since quitting: 1.2  . Smokeless tobacco: Never Used  Substance Use Topics  . Alcohol use: No    Alcohol/week: 0.0 oz  . Drug use: Yes    Types: Marijuana    Comment: smokes THC daily per pt     Review of Systems Constitutional: Negative for fever. Eyes: Negative for visual changes. ENT: Negative for congestion Cardiovascular: Negative for chest pain. Respiratory: Negative for shortness of breath. Gastrointestinal: Negative for abdominal pain,  vomiting and diarrhea. Genitourinary: Chronic Foley catheter. Musculoskeletal: Negative for back pain. Skin: Negative for rash. Neurological: Negative for headache All other ROS negative  ____________________________________________   PHYSICAL EXAM:  Constitutional: Alert and oriented. Well appearing and in no distress. Eyes: Normal exam ENT   Head: Normocephalic and atraumatic.   Mouth/Throat: Mucous membranes are moist. Cardiovascular: Normal rate, regular rhythm. No murmur Respiratory: Normal respiratory effort without tachypnea nor retractions. Breath sounds are clear Gastrointestinal: Soft, nontender, morbidly obese. Musculoskeletal: Nontender with normal range of motion in all extremities.  New wrap the left lower extremity. Neurologic:  Normal speech and language. No gross focal neurologic deficits  Skin:  Skin is warm, dry  Psychiatric: Mood and affect are normal.  Denies HI or SI.  ____________________________________________   INITIAL IMPRESSION / ASSESSMENT AND PLAN / ED COURSE  Pertinent labs & imaging results that were available during my care of the patient were reviewed by me and considered in my medical decision making (see chart for details).  Patient presents to the emergency department for possible homicidal ideation.  Discussed with EMS, please state the physician wanted to place the patient on her IVC if we were going to keep her.  We will have psychiatry see the patient in the emergency department first.  Here the patient denies any homicidal ideation.  States she was just very upset when she made those comments.  I reviewed the patient's records including recent discharge summary 04/05/17.  We will have psychiatry see the patient if they deem the patient needs to remain here we will place her on IVC and check labs.  Patient has been seen by psychiatry, believe the patient is safe for discharge home from psychiatric standpoint.  Patient continues to deny  SI and HI in the emergency department.  ____________________________________________   FINAL CLINICAL IMPRESSION(S) / ED DIAGNOSES  Homicidal ideation    Harvest Dark, MD 04/23/17 1725

## 2017-04-23 NOTE — ED Triage Notes (Signed)
She arrives today from [facility] where she went for a f/u after being admitted into the hospital with cellulitis  - upon assessment there - pt verbalized a desire to murder her son  Sent here via [facility] for psychiatric evaluation

## 2017-04-23 NOTE — ED Notes (Signed)
BEHAVIORAL HEALTH ROUNDING Patient sleeping: No. Patient alert and oriented: yes Behavior appropriate: Yes.  ; If no, describe:  Nutrition and fluids offered: yes Toileting and hygiene offered: Yes  Sitter present: q15 minute observations and security  monitoring Law enforcement present: Yes  ODS  

## 2017-04-24 ENCOUNTER — Other Ambulatory Visit: Payer: Self-pay | Admitting: *Deleted

## 2017-04-25 ENCOUNTER — Telehealth: Payer: Self-pay | Admitting: Family Medicine

## 2017-04-25 DIAGNOSIS — I739 Peripheral vascular disease, unspecified: Secondary | ICD-10-CM | POA: Diagnosis not present

## 2017-04-25 DIAGNOSIS — I2729 Other secondary pulmonary hypertension: Secondary | ICD-10-CM | POA: Diagnosis not present

## 2017-04-25 DIAGNOSIS — M1990 Unspecified osteoarthritis, unspecified site: Secondary | ICD-10-CM | POA: Diagnosis not present

## 2017-04-25 DIAGNOSIS — L03116 Cellulitis of left lower limb: Secondary | ICD-10-CM | POA: Insufficient documentation

## 2017-04-25 DIAGNOSIS — N183 Chronic kidney disease, stage 3 (moderate): Secondary | ICD-10-CM | POA: Diagnosis not present

## 2017-04-25 DIAGNOSIS — I129 Hypertensive chronic kidney disease with stage 1 through stage 4 chronic kidney disease, or unspecified chronic kidney disease: Secondary | ICD-10-CM | POA: Diagnosis not present

## 2017-04-25 DIAGNOSIS — J449 Chronic obstructive pulmonary disease, unspecified: Secondary | ICD-10-CM | POA: Diagnosis not present

## 2017-04-25 DIAGNOSIS — S81812D Laceration without foreign body, left lower leg, subsequent encounter: Secondary | ICD-10-CM | POA: Diagnosis not present

## 2017-04-25 DIAGNOSIS — G35 Multiple sclerosis: Secondary | ICD-10-CM | POA: Diagnosis not present

## 2017-04-25 DIAGNOSIS — R45850 Homicidal ideations: Secondary | ICD-10-CM | POA: Insufficient documentation

## 2017-04-25 DIAGNOSIS — I700 Atherosclerosis of aorta: Secondary | ICD-10-CM | POA: Diagnosis not present

## 2017-04-25 DIAGNOSIS — S92351D Displaced fracture of fifth metatarsal bone, right foot, subsequent encounter for fracture with routine healing: Secondary | ICD-10-CM | POA: Diagnosis not present

## 2017-04-25 DIAGNOSIS — M5416 Radiculopathy, lumbar region: Secondary | ICD-10-CM | POA: Diagnosis not present

## 2017-04-25 MED ORDER — HYDROCHLOROTHIAZIDE 25 MG PO TABS: 12.5000 mg | ORAL_TABLET | Freq: Every day | ORAL | 0 refills | Status: DC

## 2017-04-25 NOTE — Assessment & Plan Note (Signed)
Contributing to overall weakness and deconditioning; f/u with neurologist

## 2017-04-25 NOTE — Telephone Encounter (Signed)
Copied from [place] [id number]. Topic: Quick Communication - See Telephone Encounter >> Apr 25, 2017 12:47 PM Cleaster Corin, NT wrote: CRM for notification. See Telephone encounter for:   04/25/17. Al[facility] from [facility] would like to speak with nurse or Dr. Sanda Klein considering pt. Blood pressure and blood pressure meds. Pt/ blood pressure was 160/86 at rest and 170/89 with short distance walking. Alissa can be reached at [phone]. (lisinopril and hydrochlorothiazide)

## 2017-04-25 NOTE — Assessment & Plan Note (Signed)
Urinary catheter changed in the ER; to be done monthly; indwelling Foley

## 2017-04-25 NOTE — Assessment & Plan Note (Signed)
Patient verbalized today her desire to kill a person by name, method, and I therefore talked with her and contacted police and EMS to have her transported to the hospital for psychiatric evaluation; it is unfortunate that she feels this way and want her to get help, but also have to take the other person's safety into account

## 2017-04-25 NOTE — Telephone Encounter (Signed)
I returned the call and spoke with Alissa Let's adjust medicines I will encourage patient to talk to psychiatrist before resuming ritalin, risk of MI or CVA Restart 5 mg of lisinopril daily Okay to resume 12.5 mg of HCTZ if needed daily for systolics over 381 Avoid sodium, stress, DASH guidelines

## 2017-04-25 NOTE — Patient Outreach (Signed)
04/24/17 Transition of care call at 11:07 am Unsuccessful telephone encounter to [name], 66 year old female for transition of care/recent hospitalization/recent ED visit on 12/26.  Plan to try again later today.   04/24/17 Transition of care call at 4:56 pm   Successful telephone encounter to [name], 66 year old female for transition of care/ongoing follow up on recent hospitalization December 4-8,2018 for left lower extremity cellulitis, dehydration,leg swelling, fall (initial encounter), hypotension as well as recent ED visit (04/23/17 - anger reaction).  5 admits in 6 months.   Spoke with pt, HIPAA identifiers verified, discussed purpose of call- transition of care as well as recent ED visit.  Pt reports on ED visit - said things  during PCP visit yesterday I should not have said/did not mean, would not hurt anyone.      Pt reports left leg is doing better, continue with Unna boot, HH RN still coming with most recent visit last week.   Pt reports completed antibiotic, no pain in leg.      Pt reports taking all of her medications, friend [name] still helping with medication medication.      RN CM inquired of pt follow up with Dr. Ola Spurr to which could hear  [name] in the background state plan to call MD office and schedule appointment.      RN CM discussed with pt, as part of transition of care plan to follow up  Again next week telephonically, following week do a home visit to which pt agreed.    Plan:  As discussed with pt, plan to follow up next week telephonically.   Zara Chess.   [place]  [phone]

## 2017-04-25 NOTE — Assessment & Plan Note (Signed)
Blood pressure medicines were stopped in the hospital; she was sent over to the ER

## 2017-04-25 NOTE — Assessment & Plan Note (Signed)
Keep appointment with infectious disease specialist

## 2017-04-25 NOTE — Assessment & Plan Note (Signed)
Follow-up with heme-onc and radiation oncologist; will send copy of note to see if mass effect on venous return playing a part in her recurrent swelling and cellulitis; would intravascular stent of some sort to allow for better venous return from the left lower extremity be feasible?

## 2017-04-26 DIAGNOSIS — I2729 Other secondary pulmonary hypertension: Secondary | ICD-10-CM | POA: Diagnosis not present

## 2017-04-26 DIAGNOSIS — G35 Multiple sclerosis: Secondary | ICD-10-CM | POA: Diagnosis not present

## 2017-04-26 DIAGNOSIS — I739 Peripheral vascular disease, unspecified: Secondary | ICD-10-CM | POA: Diagnosis not present

## 2017-04-26 DIAGNOSIS — M5416 Radiculopathy, lumbar region: Secondary | ICD-10-CM | POA: Diagnosis not present

## 2017-04-26 DIAGNOSIS — S81812D Laceration without foreign body, left lower leg, subsequent encounter: Secondary | ICD-10-CM | POA: Diagnosis not present

## 2017-04-26 DIAGNOSIS — I129 Hypertensive chronic kidney disease with stage 1 through stage 4 chronic kidney disease, or unspecified chronic kidney disease: Secondary | ICD-10-CM | POA: Diagnosis not present

## 2017-04-26 DIAGNOSIS — N183 Chronic kidney disease, stage 3 (moderate): Secondary | ICD-10-CM | POA: Diagnosis not present

## 2017-04-26 DIAGNOSIS — M1990 Unspecified osteoarthritis, unspecified site: Secondary | ICD-10-CM | POA: Diagnosis not present

## 2017-04-26 DIAGNOSIS — I700 Atherosclerosis of aorta: Secondary | ICD-10-CM | POA: Diagnosis not present

## 2017-04-26 DIAGNOSIS — S92351D Displaced fracture of fifth metatarsal bone, right foot, subsequent encounter for fracture with routine healing: Secondary | ICD-10-CM | POA: Diagnosis not present

## 2017-04-26 DIAGNOSIS — J449 Chronic obstructive pulmonary disease, unspecified: Secondary | ICD-10-CM | POA: Diagnosis not present

## 2017-05-01 ENCOUNTER — Other Ambulatory Visit: Payer: Self-pay | Admitting: *Deleted

## 2017-05-01 ENCOUNTER — Other Ambulatory Visit: Payer: Self-pay | Admitting: Family Medicine

## 2017-05-01 MED ORDER — METHOCARBAMOL 500 MG PO TABS: 500.0000 mg | ORAL_TABLET | Freq: Three times a day (TID) | ORAL | 0 refills | Status: DC | PRN

## 2017-05-01 NOTE — Telephone Encounter (Signed)
Incoming request for refills. Please advise.

## 2017-05-01 NOTE — Telephone Encounter (Signed)
Copied from [place] [id number]. Topic: Quick Communication - Rx Refill/Question >> May 01, 2017 11:19 AM Lolita Rieger, RMA wrote: Has the patient contacted their pharmacy? no   (Agent: If no, request that the patient contact the pharmacy for the refill.) refill for hydrocholothiazide 25 mg    robaxin 500 mg   Preferred Pharmacy (with phone number or street name): [facility] in [place]: Please be advised that RX refills may take up to 3 business days. We ask that you follow-up with your pharmacy.    Please call pt to clarify dosing

## 2017-05-01 NOTE — Telephone Encounter (Signed)
She should not need HCTZ; one month was approved on 04/25/17 Please resolve with pharmacy I'd like to see her back in one to two weeks to recheck her BP and do bloodwork, so we'll decide if the HCTZ will be adjusted before it is due Thank you

## 2017-05-01 NOTE — Patient Outreach (Signed)
Successful telephone encounter to [name], 67 year old female for transition of care/ongoing follow up on recent hospitalization December 4-8,2018 for left lower extremity cellulitis, dehydration, leg swelling,fall, hypotension. Pt's history includes but not limited to COPD, Hypertension, bladder neurogenesis, MS.   Spoke with pt, HIPAA identifiers verified.    Pt reports on left leg status- PCP took off [employer] last week- office visit.    Pt reports taking all of her medications to which RN CM discussed view in EMR call from  [facility] last week to PCP- BP elevated, pt told to restart Lisinopril 5 mg and HCTZ 12.5 mg (prn if systolic >923).   Pt reports taking the HCTZ every day but Lisinopril was not called in, thus asking RN CM to speak to [facility] (caregiver) about her medications.   Spoke with [name] who reports she helps pt fill her pill box, that pt has been taking the HCTZ 12.5 mg daily/prescrption called in did not have prn orders and Lisinopril 5 mg was not called in. [name] reports she is not checking pt's BP to which RN CM encouraged her to start, [name] agreed to plan to get pt a BP machine/start monitoring daily.     [name] reports pt has no refills on Robaxin, having bladder spasms, has an appointment to see Dr. Ola Spurr 1/7, if MD unable to refill will have pt follow up with Dr. Manuella Ghazi.  [name] also reports pt has no home health due to changing to [place], [facility]  working on getting pt certified. [name] reports pt is due for catheter change which is done by home health, as discussed plan to follow up with them.      Spoke with pt again, reminding her of upcoming home visit with RN CM, need to have [name] follow up with PCP about Lisinopril and HCTZ,   Obtain a BP machine- check BP.   Plan:  As discussed, caregiver to follow up with PCP office about Lisinopril, HCTZ,  Obtain a BP machine to start checking pt's BP, call [facility] about status of services/pt's need for catheter change.            As discussed with pt, plan to follow up again next week - home visit.    [name].   [facility]  [phone]

## 2017-05-02 ENCOUNTER — Telehealth: Payer: Self-pay | Admitting: Family Medicine

## 2017-05-02 DIAGNOSIS — M419 Scoliosis, unspecified: Secondary | ICD-10-CM | POA: Diagnosis not present

## 2017-05-02 DIAGNOSIS — Z9181 History of falling: Secondary | ICD-10-CM | POA: Diagnosis not present

## 2017-05-02 DIAGNOSIS — Z87891 Personal history of nicotine dependence: Secondary | ICD-10-CM | POA: Diagnosis not present

## 2017-05-02 DIAGNOSIS — F419 Anxiety disorder, unspecified: Secondary | ICD-10-CM | POA: Diagnosis not present

## 2017-05-02 DIAGNOSIS — G40909 Epilepsy, unspecified, not intractable, without status epilepticus: Secondary | ICD-10-CM | POA: Diagnosis not present

## 2017-05-02 DIAGNOSIS — M1990 Unspecified osteoarthritis, unspecified site: Secondary | ICD-10-CM | POA: Diagnosis not present

## 2017-05-02 DIAGNOSIS — M5416 Radiculopathy, lumbar region: Secondary | ICD-10-CM | POA: Diagnosis not present

## 2017-05-02 DIAGNOSIS — Z466 Encounter for fitting and adjustment of urinary device: Secondary | ICD-10-CM | POA: Diagnosis not present

## 2017-05-02 DIAGNOSIS — J449 Chronic obstructive pulmonary disease, unspecified: Secondary | ICD-10-CM | POA: Diagnosis not present

## 2017-05-02 DIAGNOSIS — C8293 Follicular lymphoma, unspecified, intra-abdominal lymph nodes: Secondary | ICD-10-CM | POA: Diagnosis not present

## 2017-05-02 DIAGNOSIS — Z7951 Long term (current) use of inhaled steroids: Secondary | ICD-10-CM | POA: Diagnosis not present

## 2017-05-02 DIAGNOSIS — N183 Chronic kidney disease, stage 3 (moderate): Secondary | ICD-10-CM | POA: Diagnosis not present

## 2017-05-02 DIAGNOSIS — I129 Hypertensive chronic kidney disease with stage 1 through stage 4 chronic kidney disease, or unspecified chronic kidney disease: Secondary | ICD-10-CM | POA: Diagnosis not present

## 2017-05-02 DIAGNOSIS — R600 Localized edema: Secondary | ICD-10-CM | POA: Diagnosis not present

## 2017-05-02 DIAGNOSIS — I739 Peripheral vascular disease, unspecified: Secondary | ICD-10-CM | POA: Diagnosis not present

## 2017-05-02 DIAGNOSIS — G629 Polyneuropathy, unspecified: Secondary | ICD-10-CM | POA: Diagnosis not present

## 2017-05-02 DIAGNOSIS — F3341 Major depressive disorder, recurrent, in partial remission: Secondary | ICD-10-CM | POA: Diagnosis not present

## 2017-05-02 DIAGNOSIS — N312 Flaccid neuropathic bladder, not elsewhere classified: Secondary | ICD-10-CM | POA: Diagnosis not present

## 2017-05-02 DIAGNOSIS — G35 Multiple sclerosis: Secondary | ICD-10-CM | POA: Diagnosis not present

## 2017-05-02 NOTE — Telephone Encounter (Signed)
Home health orders approved Please have patient see me in the next week or two if not already scheduled

## 2017-05-02 NOTE — Telephone Encounter (Signed)
Copied from [place] [id number]. Topic: Quick Communication - See Telephone Encounter >> May 02, 2017  4:38 PM Cleaster Corin, NT wrote: CRM for notification. See Telephone encounter for:   05/02/17. Lana from [facility] order needed for pt.  Skilled nursing B/p monitoring Infection prevention Vnna boot treatment as needed PT/OT evaluation  [name] can be reached at [phone]

## 2017-05-02 NOTE — Telephone Encounter (Signed)
Called pt informed her of information below. PT states that she is not out of HCTZ unsure why pharmacy sent request. PT agrees to appt on 05/12/2017 at 2:20pm. Scheduled pt accordingly.

## 2017-05-05 ENCOUNTER — Telehealth: Payer: Self-pay | Admitting: Family Medicine

## 2017-05-05 NOTE — Telephone Encounter (Signed)
Copied from [place] [id number]. Topic: Quick Communication - See Telephone Encounter >> May 05, 2017  4:28 PM Burnis Medin, NT wrote: CRM for notification. See Telephone encounter for: Colletta [place] is calling ot get verbal orders for OT for 2 times a week for 4 week. She would like a call back at [phone]  05/05/17.

## 2017-05-05 NOTE — Telephone Encounter (Signed)
Left detailed voicemail to [place] about verbal orders approved

## 2017-05-05 NOTE — Telephone Encounter (Signed)
That's fine, thank you  

## 2017-05-06 ENCOUNTER — Other Ambulatory Visit: Payer: Self-pay | Admitting: *Deleted

## 2017-05-06 NOTE — Telephone Encounter (Signed)
Left detailed voicemail of verbal order to stephanie

## 2017-05-06 NOTE — Patient Outreach (Signed)
[facility]) Care Management   05/06/2017  [name] 1951/01/09 [id number]  [name] is an 67 y.o. female  Subjective:  Pt reports [facility] restarted services yesterday, PT to come today.  [name] (caregiver, lives with pt) reports  PCP office did call back about pt's HCTZ , pt taking  1/2 of 25 mg tablet, to see PCP 1/15.   [name] reports she has not been checking pt's BP.  [name] Reports continues to assist pt in filling pill box.   [name] reports pt to see Dr. Janese [name] (Oncologist)  1/11- labs and Psychiatrist 1/14.       Objective:   Vitals:   05/06/17 1041  BP: (!) 152/78  Pulse: 76  Resp: 16  SpO2: 95%    ROS  Physical Exam  Constitutional: She is oriented to person, place, and time. She appears well-developed and well-nourished.  Cardiovascular: Normal rate, regular rhythm and normal heart sounds.  Respiratory: Effort normal and breath sounds normal.  GI: Soft. Bowel sounds are normal.  Musculoskeletal: She exhibits edema.  RLE trace edema, +1 top of right foot LLE- +1 edema, +2 top of left foot   Neurological: She is alert and oriented to person, place, and time.  Skin: Skin is dry.  Slight redness and warmth to left lower extremity.    Psychiatric: She has a normal mood and affect. Her behavior is normal. Judgment and thought content normal.    Encounter Medications:   Outpatient Encounter Medications as of 05/06/2017  Medication Sig Note  . BIOTIN PO Take by mouth daily. Pt taking 500 mcg daily   . buPROPion (WELLBUTRIN XL) 300 MG 24 hr tablet Take 300 mg by mouth every morning.    . clonazePAM (KLONOPIN) 1 MG tablet Take 1 tablet (1 mg total) by mouth at bedtime.   . Diapers & Supplies (HUGGIES PULL-UPS) MISC 1 each by Does not apply route 2 (two) times daily. Dx urinary incontinence, MS; LON 99 months   . DULoxetine (CYMBALTA) 60 MG capsule Take 1 capsule (60 mg total) by mouth every morning.   . gabapentin (NEURONTIN) 600 MG tablet  Take 1 tablet (600 mg total) by mouth 3 (three) times daily.   . hydrochlorothiazide (HYDRODIURIL) 25 MG tablet Take 0.5 tablets (12.5 mg total) by mouth daily.   . interferon beta-1a (AVONEX) 30 MCG/0.5ML PSKT injection Inject 30 mcg into the muscle every Monday.   . lidocaine-prilocaine (EMLA) cream Apply 1 application topically as needed. apply cream over the port site and then place small piece of saran wrap over the cream to protect your clothing 04/08/2017: As needed.   . methocarbamol (ROBAXIN) 500 MG tablet Take 1 tablet (500 mg total) by mouth every 8 (eight) hours as needed for muscle spasms.   . methylphenidate (RITALIN) 10 MG tablet Take 1 tablet (10 mg total) by mouth 2 (two) times daily.   . Multiple Vitamin (MULTIVITAMIN WITH MINERALS) TABS tablet Take 1 tablet by mouth daily.   . mupirocin ointment (BACTROBAN) 2 % Apply 1 application topically 2 (two) times daily. (Patient not taking: Reported on 04/23/2017)   . nystatin cream (MYCOSTATIN) Apply topically daily. (Patient not taking: Reported on 04/23/2017)   . polyethylene glycol powder (GLYCOLAX/MIRALAX) powder Take 17 g by mouth daily as needed for mild constipation. Mixed in water 04/08/2017: As needed.   Marland Kitchen QUEtiapine Fumarate (SEROQUEL XR) 150 MG 24 hr tablet Take 150 mg by mouth at bedtime.    Marland Kitchen  senna-docusate (SENOKOT-S) 8.6-50 MG tablet Take 1 tablet by mouth daily as needed for mild constipation.  04/08/2017: Does not medication, caregiver to pick up medication   . topiramate (TOPAMAX) 50 MG tablet Take 1 tablet (50 mg total) by mouth daily.   . vitamin C (ASCORBIC ACID) 500 MG tablet Take 500 mg by mouth daily.    Facility-Administered Encounter Medications as of 05/06/2017  Medication  . heparin lock flush 100 unit/mL    Functional Status:   In your present state of health, do you have any difficulty performing the following activities: 04/01/2017 03/03/2017  Hearing? N N  Vision? N Y  Difficulty concentrating or making  decisions? N N  Walking or climbing stairs? Y Y  Dressing or bathing? Y Y  Doing errands, shopping? N Y  Conservation officer, nature and eating ? - Y  Using the Toilet? - N  In the past six months, have you accidently leaked urine? - N  Comment - -  Do you have problems with loss of bowel control? - N  Managing your Medications? - N  Managing your Finances? - N  Housekeeping or managing your Housekeeping? - Y  Some recent data might be hidden    Fall/Depression Screening:    Fall Risk  04/23/2017 03/25/2017 02/10/2017  Falls in the past year? No (No Data) (No Data)  Comment - Patient denies new falls today, since [facility] RN CM outreach last week where she reported a fall then Patient denies new falls since hospital discharge 02/09/17  Number falls in past yr: - - -  Injury with Fall? - - -  Risk Factor Category  - - -  Risk for fall due to : - - -  Follow up - - -   PHQ 2/9 Scores 04/23/2017 03/03/2017 02/04/2017 02/03/2017 11/11/2016 10/09/2016 08/02/2016  PHQ - 2 Score 4 2 2 4 4  0 1  PHQ- 9 Score 13 5 6 10  - - -    Assessment:  Pleasant 67 year old female, resides with spouse, friend [name] also staying with her/assists as needed.   This RN CM following pt for transition of care/recent hospitalization December 4-8,2018 for Left lower extremity cellulitis, dehydration, leg  Swelling, fall.  Pt's history includes but not limited to COPD, HTN, MS, bladder  Neurogenesis.    Skin integrity:  Site of LLE cellulitis- slight redness and  Warmth compared to RLE,          Edema- +1 LLE, +2 top of left foot.   Trace RLE, +1 top of right foot.            Pain LLE - +3 today.     COPD: lungs clear, no complaints of sob.     Hypertension:  BP today 152/78.    Urinary status: foley catheter draining clear yellow urine, per friend [name] needs      Changing.  As discussed, to let [facility] RN know time to change.   Plan:  As discussed with pt, continue to follow for transition of care, follow up again       In 2 days for  final transition of care call.            Plan to send Dr. Sanda Klein 05/06/08 home visit encounter as well as quarterly update       Letter.   [facility] CM Care Plan Problem One     Most Recent Value  Care Plan Problem One  Risk for readmission- recent hospitalization for  recurrent cellulitis, 6 admits in 6 months.   Role Documenting the Problem One  Care Management Coordinator  Care Plan for Problem One  Active  [facility] Long Term Goal   Pt would not readmit to the hospital in the next 31 days   [facility] Long Term Goal Start Date  04/08/17  Interventions for Problem One Long Term Goal  Home visit done- LLE assessed, slight redness/warmth noted, reinforced with pt/caregiver to call MD if gets worse.   [facility] CM Short Term Goal #1   Pt would follow up with all MD appointments in the next 30 days   [facility] CM Short Term Goal #1 Start Date  04/08/17  Interventions for Short Term Goal #1  Discussed with pt's caregiver upcoming MD appointments - Cancer MD, PCP, Psychiatrist   [facility] CM Short Term Goal #2   Pt would take all medications as ordred for the next 30 days   [facility] CM Short Term Goal #2 Start Date  04/08/17 [addendum- date restarted 12/11 due to recent readmit ]  Interventions for Short Term Goal #2  Reviewed with friend [name] pt's medication, ongoing adherence        Zara Chess.   [facility]  [phone]

## 2017-05-07 ENCOUNTER — Encounter: Payer: Self-pay | Admitting: *Deleted

## 2017-05-08 ENCOUNTER — Other Ambulatory Visit: Payer: Self-pay | Admitting: *Deleted

## 2017-05-08 NOTE — Patient Outreach (Signed)
10:59 am- Unsuccessful telephone encounter to [name], 67 year old female for transition of care/recent hospitalization December 4-8,2018 for left lower extremity cellulitis, dehydration, leg swelling, fall, hypotension.  Unable to leave voice message as not set up yet.  Plan to follow up again today.   11:45 am-  Successful telephone encounter to [name], 67 year old female for transition of care/recent hospitalization December 4-8,2018 for left lower extremity cellulitis, dehydration,leg swelling, fall, hypotension.  Pt's history includes but not limited to COPD, Hypertension, bladder neurogenesis, MS.  Spoke with pt, HIPAA verified.  Pt reports left lower extremity looks better (1/8 home visit slight redness/warmth), little edema.  Pt reports sob only with exertion.   Pt reports did not follow up with Dr. Ola Spurr 05/05/17, it was changed, does not know the date (friend [name] keeps up with MD appointments).  Pt reports HH PT is to come today, OT on the  Schedule, taking all of her medications.  RN CM discussed with pt if her pharmacy was called about Lisinopril, view in EMR last month PCP restarted to which pt agreed to call.   Pt reports [name] is checking her BP every day, yesterday was 140/80's.    Plan:  As discussed with pt, today is final transition of care call, plan to follow up again in 2 weeks- check on clinical status.            Transition of care program completed.     Zara Chess.   [place] [facility]  [phone]

## 2017-05-09 ENCOUNTER — Inpatient Hospital Stay: Payer: PPO

## 2017-05-09 ENCOUNTER — Other Ambulatory Visit: Payer: Self-pay | Admitting: *Deleted

## 2017-05-09 ENCOUNTER — Inpatient Hospital Stay: Payer: PPO | Attending: Oncology | Admitting: Oncology

## 2017-05-09 ENCOUNTER — Encounter: Payer: Self-pay | Admitting: Oncology

## 2017-05-09 VITALS — BP 162/99 | HR 92 | Temp 97.4°F | Resp 18 | Wt 304.0 lb

## 2017-05-09 DIAGNOSIS — L03116 Cellulitis of left lower limb: Secondary | ICD-10-CM | POA: Diagnosis not present

## 2017-05-09 DIAGNOSIS — Z79899 Other long term (current) drug therapy: Secondary | ICD-10-CM

## 2017-05-09 DIAGNOSIS — Z87891 Personal history of nicotine dependence: Secondary | ICD-10-CM | POA: Insufficient documentation

## 2017-05-09 DIAGNOSIS — C8293 Follicular lymphoma, unspecified, intra-abdominal lymph nodes: Secondary | ICD-10-CM

## 2017-05-09 DIAGNOSIS — Z95828 Presence of other vascular implants and grafts: Secondary | ICD-10-CM

## 2017-05-09 DIAGNOSIS — G35 Multiple sclerosis: Secondary | ICD-10-CM | POA: Diagnosis not present

## 2017-05-09 DIAGNOSIS — Z5181 Encounter for therapeutic drug level monitoring: Secondary | ICD-10-CM

## 2017-05-09 LAB — CBC WITH DIFFERENTIAL/PLATELET
Basophils Absolute: 0.1 10*3/uL (ref 0–0.1)
Basophils Relative: 1 %
Eosinophils Absolute: 0.3 10*3/uL (ref 0–0.7)
Eosinophils Relative: 4 %
HCT: 38 % (ref 35.0–47.0)
Hemoglobin: 12.7 g/dL (ref 12.0–16.0)
Lymphocytes Relative: 6 %
Lymphs Abs: 0.4 10*3/uL — ABNORMAL LOW (ref 1.0–3.6)
MCH: 29.6 pg (ref 26.0–34.0)
MCHC: 33.4 g/dL (ref 32.0–36.0)
MCV: 88.5 fL (ref 80.0–100.0)
Monocytes Absolute: 0.6 10*3/uL (ref 0.2–0.9)
Monocytes Relative: 8 %
Neutro Abs: 6 10*3/uL (ref 1.4–6.5)
Neutrophils Relative %: 81 %
Platelets: 274 10*3/uL (ref 150–440)
RBC: 4.29 MIL/uL (ref 3.80–5.20)
RDW: 16.1 % — ABNORMAL HIGH (ref 11.5–14.5)
WBC: 7.3 10*3/uL (ref 3.6–11.0)

## 2017-05-09 LAB — COMPREHENSIVE METABOLIC PANEL
ALT: 21 U/L (ref 14–54)
AST: 38 U/L (ref 15–41)
Albumin: 3.8 g/dL (ref 3.5–5.0)
Alkaline Phosphatase: 73 U/L (ref 38–126)
Anion gap: 9 (ref 5–15)
BUN: 14 mg/dL (ref 6–20)
CO2: 26 mmol/L (ref 22–32)
Calcium: 9 mg/dL (ref 8.9–10.3)
Chloride: 100 mmol/L — ABNORMAL LOW (ref 101–111)
Creatinine, Ser: 1.2 mg/dL — ABNORMAL HIGH (ref 0.44–1.00)
GFR calc Af Amer: 53 mL/min — ABNORMAL LOW (ref >60)
GFR calc non Af Amer: 46 mL/min — ABNORMAL LOW (ref >60)
Glucose, Bld: 121 mg/dL — ABNORMAL HIGH (ref 65–99)
Potassium: 3.6 mmol/L (ref 3.5–5.1)
Sodium: 135 mmol/L (ref 135–145)
Total Bilirubin: 0.6 mg/dL (ref 0.3–1.2)
Total Protein: 7.2 g/dL (ref 6.5–8.1)

## 2017-05-09 MED ORDER — SODIUM CHLORIDE 0.9% FLUSH
10.0000 mL | INTRAVENOUS | Status: AC | PRN
  Administered 2017-05-09: 10 mL
  Filled 2017-05-09: qty 10

## 2017-05-09 MED ORDER — HEPARIN SOD (PORK) LOCK FLUSH 100 UNIT/ML IV SOLN
500.0000 [IU] | INTRAVENOUS | Status: AC | PRN
  Administered 2017-05-09: 500 [IU]

## 2017-05-09 NOTE — Patient Outreach (Signed)
Successful telephone encounter to [name], 67 year old female- follow up to see if pt called pharmacy about Lisinopril.  Spoke with pt, HIPPA identifiers verified.  Pt reports did not call pharmacy yet but either her or [name] will do, if pharmacy does not have the medication agreed to call PCP.   Pt reports saw Dr. Janese Banks (cancer MD) today, BP was 150/90's, lab work done - RBC and WBC good.   RN discussed with pt plan to follow up again telephonically in 2 weeks.    Plan:  As discussed with pt, plan to follow up again in 14 days, check on clinical status.    Zara Chess.   [place] [facility]  [phone]

## 2017-05-11 ENCOUNTER — Other Ambulatory Visit: Payer: Self-pay | Admitting: *Deleted

## 2017-05-11 DIAGNOSIS — C8293 Follicular lymphoma, unspecified, intra-abdominal lymph nodes: Secondary | ICD-10-CM

## 2017-05-12 ENCOUNTER — Ambulatory Visit: Payer: Self-pay | Admitting: Family Medicine

## 2017-05-12 NOTE — Progress Notes (Signed)
Hematology/Oncology Consult note [facility]  [phone] [phone]  Patient Care Team: Arnetha Courser, MD as PCP - General (Family Medicine) Sanda Klein, Satira Anis, MD as Attending Physician (Family Medicine) Christene Lye, MD (General Surgery) Lyman Speller, RN as [place] Management Clent Jacks, RN as Registered Nurse   Name of the patient: [name]  [id number]  03/02/1951   Date of visit: 05/12/17  Diagnosis- Stage II follicular lymphoma  Chief complaint/ Reason for visit- on treatment assessment prior to cycle 3 of rituxan  Heme/Onc history:1. patient is a 67 year old female who has had ongoing problems with pain in her left groin as well as his left leg swelling.   2. She had a Doppler of her left lower extremity on 02/28/2016 which showed: 1. Mild to moderate edema within the left calf. No evidence for acute DVT from left groin to popliteal fossa. Slightly limited evaluation of calf veins due to edema. 2. Left inguinal enlarged lymph nodes up to 6.2 cm, findings could be secondary to reactive node, infection, inflammation, or lymphoproliferative disease.  3. She had a repeat Doppler on 05/24/2016 which showed: IMPRESSION: 1. No evidence of lower extremity deep vein thrombosis, left. 2. Left inguinal lymphadenopathy. Differential diagnosis: Reactive adenopathy, lymphoma, metastatic disease.  4. CT abdomen on 06/14/2016 showed:  IMPRESSION: Lymphadenopathy within the left inguinal region and the left iliac chain. This is highly suspicious for underlying neoplasm. Biopsy is recommended for further evaluation.  5. Patient was seen by Dr. Jamal Collin from surgery and underwent excisional biopsy of the leftinguinal lymph node under ultrasound on 07/04/2016. Final pathology showed DIAGNOSIS:  A-B. LYMPH NODE, LEFT INGUINAL; EXCISION:  - FOLLICULAR LYMPHOMA, GRADE 1-2, PREDOMINANTLY  FOLLICULAR.   6. Patient has multiple sclerosis and has h/o depression and anxiety. She was given clonopin this morning and currently patient is drowsy but able to be aroused. She drifts off to sleep in between conversation. She is here with her friend who helps her out. Patient lives with her husband at baseline and uses a walker to ambulate. Reports pain and discomfort in her left groin. Denies fevers, chills, night sweats or weight loss  7. PET/CT scan from 07/16/16 showed: IMPRESSION: 1. Pathologic and hypermetabolic lower retroperitoneal, pelvic, and inguinal adenopathy compatible with Deauville 5 malignancy. 2. Mildly prominent right lower paratracheal lymph node, Deauville 4. 3. Accentuated activity in the right lateral pterygoid muscle without CT correlate, probably incidental, merits attention on any follow up. 4. Other imaging findings of potential clinical significance: Airway thickening is present, suggesting bronchitis or reactive airways disease. Trace left pleural effusion. Coronary and aortoiliac atherosclerosis. Prominent stool throughout the colon favors constipation. Late phase healing fractures of left lower anterior ribs.  8. Bone marrow biopsy from 07/18/2016 did not show any evidence of lymphoma. HIV and hepatitis B and hepatitis C testing was negative  9. Patient received 2 weekly doses of Rituxan on 4/19 and 08/22/2016. She did not follow-up since then. In the interim she developed herpes zoster involving the right abdominal wall. She then developed cellulitis of RLEShe is currently in rehabilitation. Patient completed 5 weekly doses of rituxan in July 2018.  She did have a repeat CT abdomen following that which showed moderate reduction in the size of abdominal and pelvic adenopathy.  However she did not have any significant improvement in her left lower extremity swelling.  Plan at that time was to pursue radiation treatment.  However patient's clinical course  was  complicated by recurrent UTIs and cellulitis requiring rehabilitation andradiation could not be then done at that time    Interval history-patient is now out of rehabilitation and feels close to her baseline.  She also follows up with infectious disease for her left lower extremity cellulitis.  She reports that her left leg has been erythematous over the last few days.  She denies any fever.  She has baseline fatigue.  Denies any appetite loss or unintentional weight loss  ECOG PS- 3 Pain scale- 4 Opioid associated constipation- no  Review of systems- Review of Systems  Constitutional: Positive for malaise/fatigue. Negative for chills, fever and weight loss.  HENT: Negative for congestion, ear discharge and nosebleeds.   Eyes: Negative for blurred vision.  Respiratory: Negative for cough, hemoptysis, sputum production, shortness of breath and wheezing.   Cardiovascular: Positive for leg swelling. Negative for chest pain, palpitations, orthopnea and claudication.  Gastrointestinal: Negative for abdominal pain, blood in stool, constipation, diarrhea, heartburn, melena, nausea and vomiting.  Genitourinary: Negative for dysuria, flank pain, frequency, hematuria and urgency.  Musculoskeletal: Negative for back pain, joint pain and myalgias.  Skin: Negative for rash.  Neurological: Negative for dizziness, tingling, focal weakness, seizures, weakness and headaches.  Endo/Heme/Allergies: Does not bruise/bleed easily.  Psychiatric/Behavioral: Negative for depression and suicidal ideas. The patient does not have insomnia.        No Known Allergies   Past Medical History:  Diagnosis Date  . Abdominal aortic atherosclerosis ([place]) 11/11/2016  . ADHD   . Anxiety   . COPD (chronic obstructive pulmonary disease) ([place])   . Depression    major depressive  . Dyspnea    doe  . Edema    left leg  . Follicular lymphoma ([place])    B Cell  . Follicular lymphoma grade II ([place])   . Hypertension   .  Hypotension    idiopathic  . Kyphoscoliosis and scoliosis 11/26/2011  . Morbid obesity ([place]) 01/05/2016  . Multiple sclerosis ([place])   . Multiple sclerosis ([place])    1980's  . Neuromuscular disorder ([place])   . Obstructive and reflux uropathy    foley  . Pain    atypical facial  . Peripheral vascular disease of lower extremity with ulceration ([place]) 11/08/2015  . Skin ulcer ([place]) 11/08/2015  . Weakness    generalized. has MS     Past Surgical History:  Procedure Laterality Date  . BACK SURGERY N/A 2002  . CYST EXCISION     lower back  . INGUINAL LYMPH NODE BIOPSY Left 07/04/2016   Procedure: INGUINAL LYMPH NODE BIOPSY;  Surgeon: Christene Lye, MD;  Location: [facility] ORS;  Service: General;  Laterality: Left;  . PORTACATH PLACEMENT N/A 07/22/2016   Procedure: INSERTION PORT-A-CATH;  Surgeon: Christene Lye, MD;  Location: [facility] ORS;  Service: General;  Laterality: N/A;  . TONSILLECTOMY AND ADENOIDECTOMY    . TUBAL LIGATION      Social History   Socioeconomic History  . Marital status: Married    Spouse name: Not on file  . Number of children: 3  . Years of education: Not on file  . Highest education level: Not on file  Social Needs  . Financial resource strain: Not on file  . Food insecurity - worry: Not on file  . Food insecurity - inability: Not on file  . Transportation needs - medical: Not on file  . Transportation needs - non-medical: Not on file  Occupational History  . Occupation: disabled  Tobacco Use  . Smoking status: Former Smoker    Packs/day: 1.00    Years: 20.00    Pack years: 20.00    Types: Cigarettes    Last attempt to quit: 02/03/2016    Years since quitting: 1.2  . Smokeless tobacco: Never Used  Substance and Sexual Activity  . Alcohol use: No    Alcohol/week: 0.0 oz  . Drug use: Yes    Types: Marijuana    Comment: smokes THC daily per pt   . Sexual activity: No  Other Topics Concern  . Not on file  Social History Narrative  . Not on  file    Family History  Problem Relation Age of Onset  . COPD Mother   . Diabetes Mother   . Heart failure Mother   . Alcohol abuse Father   . Kidney disease Father   . Kidney failure Father   . Arthritis Sister   . CAD Maternal Grandmother   . Stroke Maternal Grandfather   . Arthritis Sister   . Mental illness Sister   . Arthritis Brother      Current Outpatient Medications:  .  BIOTIN PO, Take by mouth daily. Pt taking 500 mcg daily, Disp: , Rfl:  .  buPROPion (WELLBUTRIN XL) 300 MG 24 hr tablet, Take 300 mg by mouth every morning. , Disp: , Rfl:  .  clonazePAM (KLONOPIN) 1 MG tablet, Take 1 tablet (1 mg total) by mouth at bedtime., Disp: 5 tablet, Rfl: 0 .  Diapers & Supplies (HUGGIES PULL-UPS) MISC, 1 each by Does not apply route 2 (two) times daily. Dx urinary incontinence, MS; LON 99 months, Disp: 60 each, Rfl: 11 .  DULoxetine (CYMBALTA) 60 MG capsule, Take 1 capsule (60 mg total) by mouth every morning., Disp: 30 capsule, Rfl: 3 .  gabapentin (NEURONTIN) 600 MG tablet, Take 1 tablet (600 mg total) by mouth 3 (three) times daily., Disp: , Rfl:  .  hydrochlorothiazide (HYDRODIURIL) 25 MG tablet, Take 0.5 tablets (12.5 mg total) by mouth daily., Disp: 15 tablet, Rfl: 0 .  interferon beta-1a (AVONEX) 30 MCG/0.5ML PSKT injection, Inject 30 mcg into the muscle every Monday., Disp: , Rfl:  .  lidocaine-prilocaine (EMLA) cream, Apply 1 application topically as needed. apply cream over the port site and then place small piece of saran wrap over the cream to protect your clothing, Disp: 30 g, Rfl: 1 .  methocarbamol (ROBAXIN) 500 MG tablet, Take 1 tablet (500 mg total) by mouth every 8 (eight) hours as needed for muscle spasms., Disp: 50 tablet, Rfl: 0 .  methylphenidate (RITALIN) 10 MG tablet, Take 1 tablet (10 mg total) by mouth 2 (two) times daily., Disp: 30 tablet, Rfl: 0 .  Multiple Vitamin (MULTIVITAMIN WITH MINERALS) TABS tablet, Take 1 tablet by mouth daily., Disp: , Rfl:  .   QUEtiapine Fumarate (SEROQUEL XR) 150 MG 24 hr tablet, Take 150 mg by mouth at bedtime. , Disp: , Rfl: 4 .  topiramate (TOPAMAX) 50 MG tablet, Take 1 tablet (50 mg total) by mouth daily., Disp: 30 tablet, Rfl: 0 .  vitamin C (ASCORBIC ACID) 500 MG tablet, Take 500 mg by mouth daily., Disp: , Rfl:  .  polyethylene glycol powder (GLYCOLAX/MIRALAX) powder, Take 17 g by mouth daily as needed for mild constipation. Mixed in water (Patient not taking: Reported on 05/06/2017), Disp: , Rfl:  .  senna-docusate (SENOKOT-S) 8.6-50 MG tablet, Take 1 tablet by mouth daily as needed for mild constipation. , Disp: ,  Rfl:  No current facility-administered medications for this visit.   Facility-Administered Medications Ordered in Other Visits:  .  heparin lock flush 100 unit/mL, 500 Units, Intracatheter, Once PRN, Sindy Guadeloupe, MD  Physical exam:  Vitals:   05/09/17 1359  BP: (!) 162/99  Pulse: 92  Resp: 18  Temp: (!) 97.4 F (36.3 C)  TempSrc: Tympanic  Weight: (!) 304 lb (137.9 kg)   Physical Exam  Constitutional: She is oriented to person, place, and time.  Patient is obese and sitting in a wheelchair.  Appears in no acute distress  HENT:  Head: Normocephalic and atraumatic.  Eyes: EOM are normal. Pupils are equal, round, and reactive to light.  Neck: Normal range of motion.  Cardiovascular: Normal rate, regular rhythm and normal heart sounds.  Pulmonary/Chest: Effort normal and breath sounds normal.  Abdominal: Soft. Bowel sounds are normal.  Difficult to assess for adenopathy given her body habitus  Musculoskeletal: She exhibits edema (Left greater than right).  There is mild erythema noted over the left leg and local warmth.  No open wounds or ulcers  Neurological: She is alert and oriented to person, place, and time.  Skin: Skin is warm and dry.     CMP Latest Ref Rng & Units 05/09/2017  Glucose 65 - 99 mg/dL 121(H)  BUN 6 - 20 mg/dL 14  Creatinine 0.44 - 1.00 mg/dL 1.20(H)  Sodium 135  - 145 mmol/L 135  Potassium 3.5 - 5.1 mmol/L 3.6  Chloride 101 - 111 mmol/L 100(L)  CO2 22 - 32 mmol/L 26  Calcium 8.9 - 10.3 mg/dL 9.0  Total Protein 6.5 - 8.1 g/dL 7.2  Total Bilirubin 0.3 - 1.2 mg/dL 0.6  Alkaline Phos 38 - 126 U/L 73  AST 15 - 41 U/L 38  ALT 14 - 54 U/L 21   CBC Latest Ref Rng & Units 05/09/2017  WBC 3.6 - 11.0 K/uL 7.3  Hemoglobin 12.0 - 16.0 g/dL 12.7  Hematocrit 35.0 - 47.0 % 38.0  Platelets 150 - 440 K/uL 274     Assessment and plan- Patient is a 67 y.o. female with Stage II follicular lymphoma  Stage II follicular lymphoma: I will repeat CT abdomen and pelvis without contrast given her history of AKI in the next couple of weeks.  I will call her with the results of the CT scan.  It has been 6 months since her last CT scan.  Despite 5 weekly doses of Rituxan, patient did not have any significant improvement in her left lower extremity swelling although there was some improvement in her adenopathy.  If CT scan shows stable findings I am inclined to monitor this without retreatment or radiation treatment at this time.  Patient did not have significant B symptoms even prior to starting her Rituxan other than her left lower extremity swelling which did not show any significant improvement with treatment anyways  Left lower extremity cellulitis: There is evidence of mild erythema over her left leg and local warmth which is concerning for early cellulitis.  Patient has been on multiple courses of antibiotics in the past and follows up with infectious diseases.  I will therefore hold off on starting any antibiotics at this time and I have encouraged her to speak to Dr. Sanda Klein and ID about starting antibiotics if need be at this time  I will see her back in 6 months time with a CBC CMP and LDH.  She will call us sooner if she develops any new lumps  or bumps, B symptoms such as fever, unintentional weight loss or drenching night sweats   Visit Diagnosis 1. Follicular lymphoma  of intra-abdominal lymph nodes, unspecified grade ([place])      Dr. Randa Evens, MD, MPH [facility] Pager- [phone] 05/12/2017 9:05 AM

## 2017-05-13 ENCOUNTER — Ambulatory Visit: Payer: Self-pay

## 2017-05-13 ENCOUNTER — Telehealth: Payer: Self-pay | Admitting: Family Medicine

## 2017-05-13 ENCOUNTER — Other Ambulatory Visit: Payer: Self-pay

## 2017-05-13 ENCOUNTER — Observation Stay
Admission: EM | Admit: 2017-05-13 | Discharge: 2017-05-15 | Disposition: A | Discharge status: Home / Self Care | Payer: PPO | Attending: Family Medicine | Admitting: Family Medicine

## 2017-05-13 ENCOUNTER — Ambulatory Visit: Payer: Self-pay | Admitting: Family Medicine

## 2017-05-13 ENCOUNTER — Encounter: Payer: Self-pay | Admitting: *Deleted

## 2017-05-13 DIAGNOSIS — L03116 Cellulitis of left lower limb: Principal | ICD-10-CM | POA: Insufficient documentation

## 2017-05-13 DIAGNOSIS — F419 Anxiety disorder, unspecified: Secondary | ICD-10-CM | POA: Insufficient documentation

## 2017-05-13 DIAGNOSIS — Z95828 Presence of other vascular implants and grafts: Secondary | ICD-10-CM | POA: Insufficient documentation

## 2017-05-13 DIAGNOSIS — N319 Neuromuscular dysfunction of bladder, unspecified: Secondary | ICD-10-CM | POA: Insufficient documentation

## 2017-05-13 DIAGNOSIS — G4733 Obstructive sleep apnea (adult) (pediatric): Secondary | ICD-10-CM | POA: Diagnosis not present

## 2017-05-13 DIAGNOSIS — Z6841 Body Mass Index (BMI) 40.0 and over, adult: Secondary | ICD-10-CM | POA: Diagnosis not present

## 2017-05-13 DIAGNOSIS — F329 Major depressive disorder, single episode, unspecified: Secondary | ICD-10-CM | POA: Diagnosis not present

## 2017-05-13 DIAGNOSIS — R600 Localized edema: Secondary | ICD-10-CM | POA: Diagnosis not present

## 2017-05-13 DIAGNOSIS — F909 Attention-deficit hyperactivity disorder, unspecified type: Secondary | ICD-10-CM | POA: Diagnosis not present

## 2017-05-13 DIAGNOSIS — C8213 Follicular lymphoma grade II, intra-abdominal lymph nodes: Secondary | ICD-10-CM | POA: Diagnosis not present

## 2017-05-13 DIAGNOSIS — I700 Atherosclerosis of aorta: Secondary | ICD-10-CM | POA: Insufficient documentation

## 2017-05-13 DIAGNOSIS — Z436 Encounter for attention to other artificial openings of urinary tract: Secondary | ICD-10-CM | POA: Insufficient documentation

## 2017-05-13 DIAGNOSIS — I10 Essential (primary) hypertension: Secondary | ICD-10-CM | POA: Diagnosis not present

## 2017-05-13 DIAGNOSIS — L0390 Cellulitis, unspecified: Secondary | ICD-10-CM | POA: Diagnosis present

## 2017-05-13 DIAGNOSIS — I70209 Unspecified atherosclerosis of native arteries of extremities, unspecified extremity: Secondary | ICD-10-CM | POA: Diagnosis not present

## 2017-05-13 DIAGNOSIS — J449 Chronic obstructive pulmonary disease, unspecified: Secondary | ICD-10-CM | POA: Insufficient documentation

## 2017-05-13 DIAGNOSIS — G35 Multiple sclerosis: Secondary | ICD-10-CM | POA: Diagnosis not present

## 2017-05-13 DIAGNOSIS — M5416 Radiculopathy, lumbar region: Secondary | ICD-10-CM | POA: Diagnosis not present

## 2017-05-13 DIAGNOSIS — Z79899 Other long term (current) drug therapy: Secondary | ICD-10-CM | POA: Diagnosis not present

## 2017-05-13 DIAGNOSIS — E876 Hypokalemia: Secondary | ICD-10-CM | POA: Insufficient documentation

## 2017-05-13 DIAGNOSIS — M419 Scoliosis, unspecified: Secondary | ICD-10-CM | POA: Insufficient documentation

## 2017-05-13 DIAGNOSIS — N139 Obstructive and reflux uropathy, unspecified: Secondary | ICD-10-CM | POA: Diagnosis not present

## 2017-05-13 LAB — URINALYSIS, COMPLETE (UACMP) WITH MICROSCOPIC
Bacteria, UA: NONE SEEN
Bilirubin Urine: NEGATIVE
Glucose, UA: NEGATIVE mg/dL
Hgb urine dipstick: NEGATIVE
Ketones, ur: NEGATIVE mg/dL
Nitrite: NEGATIVE
Protein, ur: NEGATIVE mg/dL
Specific Gravity, Urine: 1.009 (ref 1.005–1.030)
pH: 6 (ref 5.0–8.0)

## 2017-05-13 LAB — COMPREHENSIVE METABOLIC PANEL
ALT: 21 U/L (ref 14–54)
AST: 27 U/L (ref 15–41)
Albumin: 4 g/dL (ref 3.5–5.0)
Alkaline Phosphatase: 74 U/L (ref 38–126)
Anion gap: 12 (ref 5–15)
BUN: 15 mg/dL (ref 6–20)
CO2: 23 mmol/L (ref 22–32)
Calcium: 9.4 mg/dL (ref 8.9–10.3)
Chloride: 103 mmol/L (ref 101–111)
Creatinine, Ser: 1.17 mg/dL — ABNORMAL HIGH (ref 0.44–1.00)
GFR calc Af Amer: 55 mL/min — ABNORMAL LOW (ref >60)
GFR calc non Af Amer: 47 mL/min — ABNORMAL LOW (ref >60)
Glucose, Bld: 127 mg/dL — ABNORMAL HIGH (ref 65–99)
Potassium: 3.3 mmol/L — ABNORMAL LOW (ref 3.5–5.1)
Sodium: 138 mmol/L (ref 135–145)
Total Bilirubin: 0.4 mg/dL (ref 0.3–1.2)
Total Protein: 7.3 g/dL (ref 6.5–8.1)

## 2017-05-13 LAB — WET PREP, GENITAL
Clue Cells Wet Prep HPF POC: NONE SEEN
Sperm: NONE SEEN
Trich, Wet Prep: NONE SEEN
Yeast Wet Prep HPF POC: NONE SEEN

## 2017-05-13 LAB — CBC
HCT: 39.9 % (ref 35.0–47.0)
Hemoglobin: 13.3 g/dL (ref 12.0–16.0)
MCH: 29.7 pg (ref 26.0–34.0)
MCHC: 33.3 g/dL (ref 32.0–36.0)
MCV: 89 fL (ref 80.0–100.0)
Platelets: 282 10*3/uL (ref 150–440)
RBC: 4.49 MIL/uL (ref 3.80–5.20)
RDW: 16.2 % — ABNORMAL HIGH (ref 11.5–14.5)
WBC: 7.8 10*3/uL (ref 3.6–11.0)

## 2017-05-13 LAB — LIPASE, BLOOD: Lipase: 29 U/L (ref 11–51)

## 2017-05-13 MED ORDER — CLINDAMYCIN PHOSPHATE 600 MG/50ML IV SOLN
600.0000 mg | Freq: Three times a day (TID) | INTRAVENOUS | Status: DC
  Administered 2017-05-13 – 2017-05-15 (×6): 600 mg via INTRAVENOUS
  Filled 2017-05-13 (×11): qty 50

## 2017-05-13 MED ORDER — MORPHINE SULFATE (PF) 4 MG/ML IV SOLN
4.0000 mg | Freq: Once | INTRAVENOUS | Status: AC
Start: 2017-05-13 — End: 2017-05-13
  Administered 2017-05-13: 4 mg via INTRAVENOUS
  Filled 2017-05-13: qty 1

## 2017-05-13 MED ORDER — ONDANSETRON HCL 4 MG/2ML IJ SOLN
4.0000 mg | Freq: Once | INTRAMUSCULAR | Status: AC
Start: 2017-05-13 — End: 2017-05-13
  Administered 2017-05-13: 4 mg via INTRAVENOUS
  Filled 2017-05-13: qty 2

## 2017-05-13 MED ORDER — CLINDAMYCIN PHOSPHATE 600 MG/50ML IV SOLN
600.0000 mg | Freq: Once | INTRAVENOUS | Status: AC
  Administered 2017-05-13: 600 mg via INTRAVENOUS
  Filled 2017-05-13: qty 50

## 2017-05-13 NOTE — Telephone Encounter (Signed)
Patient's caregiver called to ask if the patient needed to go to the ED or come to her OV today at 2:15pm as scheduled because she is having pain, unable to talk to caregiver due to she is not on the patient's DPR, patient got on the phone. I asked the patient why was she calling in today, she stated "my home health nurse came in yesterday and tried to get me to go to the ED because my leg is swollen so big it's about to pop and it is warm, red, and painful." I asked how is she feeling today, she said "I have pain, but I can walk on it." I asked is her leg the same or worse than yesterday, she said "the same." I asked is the pain the same or worse than yesterday or the day before, she says "the same." I asked is she running a fever, having chills, she said "I don't have any way to check my temperature, but I don't feel like I have one and no chills." I advised patient to go to her appointment today with her provider. This was an advice call only, so no triage needed.

## 2017-05-13 NOTE — Telephone Encounter (Signed)
Looks like pt will miss appt today as she is currently in the ED.

## 2017-05-13 NOTE — Progress Notes (Signed)
-  Family Meeting Note  Advance Directive NO Today a meeting took place with the patient, caregiver/friend and MD in the ER   The following were discussed:Patient's diagnosis: , Patient's progosis: Patient is being admitted with recurrent cellulitis lower extremity and bilateral lower extremity edema.  She has multiple sclerosis with chronic indwelling Foley catheter.  She has several comorbidities.  Discussed CODE STATUS she requests full code   Time spent during discussion:Mayela Bullard Posey Pronto, MD 16 mins

## 2017-05-13 NOTE — ED Provider Notes (Signed)
[facility] Emergency Department Provider Note ____________________________________________   I have reviewed the triage vital signs and the triage nursing note.  HISTORY  Chief Complaint Leg Swelling and Abdominal Pain   Historian Patient and family friend who is also a caregiver  HPI [name] is a 67 y.o. female with morbid obesity, multiple sclerosis, follows with oncology for B-cell lymphoma, but states that she has nearly completed treatment for that, she has an indwelling Foley catheter, she is brought in for being a little irritable over the past week or so, and friend is concerned about increased redness to the left lower extremity, where the patient has previously had a cellulitis.  States that started maybe 2-3 days ago, looked a little worse yesterday and may be slightly better today but is still there.  There is no additional pain or swelling, there is chronic swelling.  Friend also has noted that she has had some "white mucousy discharge "when being wiped, she is unclear whether or not it is coming urinary, vaginal, or rectal.  Reportedly when the patient had a lower extremity cellulitis in the past, her white blood cell count was normal and she was discharged on p.o. antibiotics and came back the next day extremely sick.  They are highly concerned about a cellulitis.   Past Medical History:  Diagnosis Date  . Abdominal aortic atherosclerosis ([place]) 11/11/2016  . ADHD   . Anxiety   . COPD (chronic obstructive pulmonary disease) ([place])   . Depression    major depressive  . Dyspnea    doe  . Edema    left leg  . Follicular lymphoma ([place])    B Cell  . Follicular lymphoma grade II ([place])   . Hypertension   . Hypotension    idiopathic  . Kyphoscoliosis and scoliosis 11/26/2011  . Morbid obesity ([place]) 01/05/2016  . Multiple sclerosis ([place])   . Multiple sclerosis ([place])    1980's  . Neuromuscular disorder ([place])   . Obstructive and reflux uropathy     foley  . Pain    atypical facial  . Peripheral vascular disease of lower extremity with ulceration ([place]) 11/08/2015  . Skin ulcer ([place]) 11/08/2015  . Weakness    generalized. has MS    Patient Active Problem List   Diagnosis Date Noted  . Homicidal ideation 04/25/2017  . Cellulitis of left lower leg 04/25/2017  . Adjustment disorder with mixed disturbance of emotions and conduct 04/23/2017  . Sepsis ([place]) 02/06/2017  . Foot pain, bilateral 02/03/2017  . Tinea pedis 02/03/2017  . Lymphoma ([place]) 01/07/2017  . Pseudomonas urinary tract infection 01/02/2017  . Left ovarian cyst 12/09/2016  . Abdominal aortic atherosclerosis ([place]) 11/11/2016  . Swelling of lower extremity 10/11/2016  . Obstructive sleep apnea 10/11/2016  . Follicular lymphoma of intra-abdominal lymph nodes ([place]) 08/06/2016  . Multiple falls 06/07/2016  . Inguinal adenopathy 05/29/2016  . Obesity, morbid ([place]) 01/05/2016  . Low HDL (under 40) 12/19/2015  . Skin ulcer ([place]) 11/08/2015  . Peripheral vascular disease of lower extremity with ulceration ([place]) 11/08/2015  . CKD (chronic kidney disease) stage 3, GFR 30-59 ml/min ([facility]) 11/08/2015  . Constipation due to pain medication 01/31/2015  . Obstructive sleep apnea of adult 01/13/2015  . Pelvic muscle wasting 01/13/2015  . Incomplete bladder emptying 01/13/2015  . Headache, migraine 10/24/2014  . 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 Current tobacco use 10/24/2014  . Lumbar radiculopathy, chronic 10/02/2013  . COPD with bronchial hyperresponsiveness ([place]) 10/02/2013  .  Major depressive disorder, recurrent, in partial remission ([place]) 10/02/2013  . Hypertension goal BP (blood pressure) < 140/90 10/02/2013  . Multiple sclerosis ([place]) 10/02/2013  . Absence of bladder continence 09/25/2012  . Acontractile bladder 02/12/2012  . Bladder retention 02/12/2012  . Narrowing of intervertebral disc space 11/26/2011  . Kyphoscoliosis and scoliosis 11/26/2011    Past  Surgical History:  Procedure Laterality Date  . BACK SURGERY N/A 2002  . CYST EXCISION     lower back  . INGUINAL LYMPH NODE BIOPSY Left 07/04/2016   Procedure: INGUINAL LYMPH NODE BIOPSY;  Surgeon: Christene Lye, MD;  Location: [facility] ORS;  Service: General;  Laterality: Left;  . PORTACATH PLACEMENT N/A 07/22/2016   Procedure: INSERTION PORT-A-CATH;  Surgeon: Christene Lye, MD;  Location: [facility] ORS;  Service: General;  Laterality: N/A;  . TONSILLECTOMY AND ADENOIDECTOMY    . TUBAL LIGATION      Prior to Admission medications   Medication Sig Start Date End Date Taking? Authorizing Provider  BIOTIN PO Take by mouth daily. Pt taking 500 mcg daily    [provider]  buPROPion (WELLBUTRIN XL) 300 MG 24 hr tablet Take 300 mg by mouth every morning.  05/28/16   [provider]  clonazePAM (KLONOPIN) 1 MG tablet Take 1 tablet (1 mg total) by mouth at bedtime. 01/06/17   [name], MD  Diapers & Supplies (HUGGIES PULL-UPS) MISC 1 each by Does not apply route 2 (two) times daily. Dx urinary incontinence, MS; LON 99 months 02/03/17   Arnetha Courser, MD  DULoxetine (CYMBALTA) 60 MG capsule Take 1 capsule (60 mg total) by mouth every morning. 10/14/16   Vaughan Basta, MD  gabapentin (NEURONTIN) 600 MG tablet Take 1 tablet (600 mg total) by mouth 3 (three) times daily. 02/09/17   Bettey Costa, MD  hydrochlorothiazide (HYDRODIURIL) 25 MG tablet Take 0.5 tablets (12.5 mg total) by mouth daily. 04/25/17   Arnetha Courser, MD  interferon beta-1a (AVONEX) 30 MCG/0.5ML PSKT injection Inject 30 mcg into the muscle every Monday.    [provider]  lidocaine-prilocaine (EMLA) cream Apply 1 application topically as needed. apply cream over the port site and then place small piece of saran wrap over the cream to protect your clothing 11/07/16   Sindy Guadeloupe, MD  methocarbamol (ROBAXIN) 500 MG tablet Take 1 tablet (500 mg total) by mouth every 8 (eight) hours as  needed for muscle spasms. 05/01/17   Arnetha Courser, MD  methylphenidate (RITALIN) 10 MG tablet Take 1 tablet (10 mg total) by mouth 2 (two) times daily. 10/14/16   Vaughan Basta, MD  Multiple Vitamin (MULTIVITAMIN WITH MINERALS) TABS tablet Take 1 tablet by mouth daily.    [provider]  polyethylene glycol powder (GLYCOLAX/MIRALAX) powder Take 17 g by mouth daily as needed for mild constipation. Mixed in water Patient not taking: Reported on 05/06/2017 01/06/17   [name], MD  QUEtiapine Fumarate (SEROQUEL XR) 150 MG 24 hr tablet Take 150 mg by mouth at bedtime.     [provider]  senna-docusate (SENOKOT-S) 8.6-50 MG tablet Take 1 tablet by mouth daily as needed for mild constipation.     [provider]  topiramate (TOPAMAX) 50 MG tablet Take 1 tablet (50 mg total) by mouth daily. 10/14/16   Vaughan Basta, MD  vitamin C (ASCORBIC ACID) 500 MG tablet Take 500 mg by mouth daily.    [provider]    No Known Allergies  Family History  Problem Relation Age of Onset  . COPD Mother   . Diabetes Mother   . Heart failure Mother   . Alcohol abuse Father   . Kidney disease Father   . Kidney failure Father   . Arthritis Sister   . CAD Maternal Grandmother   . Stroke Maternal Grandfather   . Arthritis Sister   . Mental illness Sister   . Arthritis Brother     Social History Social History   Tobacco Use  . Smoking status: Former Smoker    Packs/day: 1.00    Years: 20.00    Pack years: 20.00    Types: Cigarettes    Last attempt to quit: 02/03/2016    Years since quitting: 1.2  . Smokeless tobacco: Never Used  Substance Use Topics  . Alcohol use: No    Alcohol/week: 0.0 oz  . Drug use: Yes    Types: Marijuana    Comment: smokes THC daily per pt     Review of Systems  Constitutional: Negative for fever. Eyes: Negative for visual changes. ENT: Negative for sore throat. Cardiovascular: Negative for chest  pain. Respiratory: Negative for shortness of breath. Gastrointestinal: Negative for abdominal pain, vomiting and diarrhea. Genitourinary: Negative for dysuria.  Apparently the urine has looked darker cloudy and has a strong smell. Musculoskeletal: Negative for back pain. Skin: Negative for rash. Neurological: Negative for headache.  ____________________________________________   PHYSICAL EXAM:  VITAL SIGNS: ED Triage Vitals  Enc Vitals Group     BP 05/13/17 1328 137/77     Pulse Rate 05/13/17 1328 93     Resp 05/13/17 1328 15     Temp 05/13/17 1328 98.5 F (36.9 C)     Temp Source 05/13/17 1328 Oral     SpO2 05/13/17 1328 98 %     Weight 05/13/17 1328 290 lb (131.5 kg)     Height 05/13/17 1328 5\' 4"  (1.626 m)     Head Circumference --      Peak Flow --      Pain Score 05/13/17 1341 8     Pain Loc --      Pain Edu? --      Excl. in [place]? --      Constitutional: Alert and oriented. Well appearing and in no distress. HEENT   Head: Normocephalic and atraumatic.      Eyes: Conjunctivae are normal. Pupils equal and round.       Ears:         Nose: No congestion/rhinnorhea.   Mouth/Throat: Mucous membranes are moist.   Neck: No stridor. Cardiovascular/Chest: Normal rate, regular rhythm.  No murmurs, rubs, or gallops. Respiratory: Normal respiratory effort without tachypnea nor retractions. Breath sounds are clear and equal bilaterally. No wheezes/rales/rhonchi. Gastrointestinal: Soft. No distention, no guarding, no rebound. Nontender.  Morbidly obese. Genitourinary/rectal: Small amount of white vaginal discharge, somewhat foul-smelling, no cervicitis.  External rectal area looks normal. Musculoskeletal: Nontender with normal range of motion in all extremities. No joint effusions.  No lower extremity tenderness.  2+ lower extremity pitting edema bilateral lower extremities.  Left lower extremity does have some erythema which appears to be dependent but it does go from the  thigh down into the foot, only on the left side. Neurologic:  Normal speech and language. No gross or focal neurologic deficits are appreciated. Skin:  Skin is warm, dry and intact. No rash noted. Psychiatric: Mood and affect are normal. Speech and behavior are normal. Patient exhibits appropriate insight and judgment.  ____________________________________________  LABS (pertinent positives/negatives) I, Lisa Roca, MD the attending physician have reviewed the labs noted below.  Labs Reviewed  WET PREP, GENITAL - Abnormal; Notable for the following components:      Result Value   WBC, Wet Prep HPF POC MODERATE (*)    All other components within normal limits  COMPREHENSIVE METABOLIC PANEL - Abnormal; Notable for the following components:   Potassium 3.3 (*)    Glucose, Bld 127 (*)    Creatinine, Ser 1.17 (*)    GFR calc non Af Amer 47 (*)    GFR calc Af Amer 55 (*)    All other components within normal limits  CBC - Abnormal; Notable for the following components:   RDW 16.2 (*)    All other components within normal limits  URINALYSIS, COMPLETE (UACMP) WITH MICROSCOPIC - Abnormal; Notable for the following components:   Color, Urine YELLOW (*)    APPearance HAZY (*)    Leukocytes, UA MODERATE (*)    Squamous Epithelial / LPF 0-5 (*)    All other components within normal limits  URINE CULTURE  CULTURE, BLOOD (ROUTINE X 2)  CULTURE, BLOOD (ROUTINE X 2)  LIPASE, BLOOD    ____________________________________________    EKG I, Lisa Roca, MD, the attending physician have personally viewed and interpreted all ECGs.  None ____________________________________________  RADIOLOGY All Xrays were viewed by me.  Imaging interpreted by Radiologist, and I, Lisa Roca, MD the attending physician have reviewed the radiologist interpretation noted below.  None __________________________________________  PROCEDURES  Procedure(s) performed: None  Critical Care performed:  None   ____________________________________________  ED COURSE / ASSESSMENT AND PLAN  Pertinent labs & imaging results that were available during my care of the patient were reviewed by me and considered in my medical decision making (see chart for details).   Patient does not have altered mental status here, friend states that she is been a little irritable but not really decreased level consciousness or confusion.  The most concerned about having her Foley catheter change because it has been a while, and the urine has a strong smell.  Additionally the other main concern is about left lower extremely cellulitis due to new redness.  Patient's white blood cell count is not elevated, however she does have a history of sounds like a very quick worsening of symptoms over a period of 24 hours.  The amount of redness today looks like it could be dependent related to skin swelling, however given her significant history and this could be an early cellulitis, I will go ahead and cover with antibiotics.  Patient does not appear septic now, the left lower extremity redness does extend all the way up into the thigh, so if this is cellulitis it is a significant area.  The patient has a history of decline over a 24-hour.  Just a few months ago, and I am concerned about her possibility for decline and am recommending observation.  The patient and caregiver do not feel comfortable taking her home and would like to have her observe.  Fully she will be stable and potentially able to go home tomorrow and switch to p.o. antibiotics.  DIFFERENTIAL DIAGNOSIS: Including but not limited to CHF, cellulitis, dehydration, urinary tract infection, bacterial vaginosis, etc.  CONSULTATIONS:   Hospitalist for admission.   Patient / Family / Caregiver informed of clinical course, medical decision-making process, and agree with plan.   ___________________________________________   FINAL CLINICAL IMPRESSION(S) / ED  DIAGNOSES  Final diagnoses:  Cellulitis of left lower extremity      ___________________________________________        Note: This dictation was prepared with Dragon dictation. Any transcriptional errors that result from this process are unintentional    Lisa Roca, MD 05/13/17 1616

## 2017-05-13 NOTE — Telephone Encounter (Signed)
Patient was scheduled to see me at 2:20 pm today Currently in the ER Staff will notify front so she is not penalized

## 2017-05-13 NOTE — Telephone Encounter (Signed)
Copied from [place] [phone]. Topic: Quick Communication - See Telephone Encounter >> May 13, 2017 12:17 PM Burnis Medin, NT wrote: CRM for notification. See Telephone encounter for: Sela Hua was calling to report that the patient will miss physical therapy today because patient went to the emergency room due to her left leg being red hot and swollen.  05/13/17.

## 2017-05-13 NOTE — H&P (Signed)
[place] [facility] [place] NAME: [name]    MR#:  [id number]  DATE OF BIRTH:  July 10, 1950  DATE OF ADMISSION:  05/13/2017  PRIMARY CARE PHYSICIAN: Arnetha Courser, MD   REQUESTING/REFERRING PHYSICIAN: Dr. Reita Cliche  CHIEF COMPLAINT:   Redness left lower extremity for 2-3 days HISTORY OF PRESENT ILLNESS:  [name]  is a 67 y.o. female with a known history of COPD, depression, anxiety, lower extremity edema chronic and history of B-cell follicular lymphoma grade 2 followed at the cancer center comes to the emergency room with a caregiver/friend with left lower extremity redness and bilateral lower extremity edema.  Patient was admitted in December for similar symptoms and was prescribed  a 21-day course of Keflex. She is afebrile.  White count is normal.  Blood pressure stable.  She is being admitted with recurrent cellulitis.  In the ER she received IV clindamycin  She has history of multiple sclerosis.  She has chronic Foley catheter.  At baseline she ambulates using a walker.  She needs a lot of help which is provided by her husband and friend/caregiver  PAST MEDICAL HISTORY:   Past Medical History:  Diagnosis Date  . Abdominal aortic atherosclerosis ([place]) 11/11/2016  . ADHD   . Anxiety   . COPD (chronic obstructive pulmonary disease) ([place])   . Depression    major depressive  . Dyspnea    doe  . Edema    left leg  . Follicular lymphoma ([place])    B Cell  . Follicular lymphoma grade II ([place])   . Hypertension   . Hypotension    idiopathic  . Kyphoscoliosis and scoliosis 11/26/2011  . Morbid obesity ([place]) 01/05/2016  . Multiple sclerosis ([place])   . Multiple sclerosis ([place])    1980's  . Neuromuscular disorder ([place])   . Obstructive and reflux uropathy    foley  . Pain    atypical facial  . Peripheral vascular disease of lower extremity with ulceration ([place]) 11/08/2015  . Skin ulcer ([place]) 11/08/2015  . Weakness    generalized. has  MS    PAST SURGICAL HISTOIRY:   Past Surgical History:  Procedure Laterality Date  . BACK SURGERY N/A 2002  . CYST EXCISION     lower back  . INGUINAL LYMPH NODE BIOPSY Left 07/04/2016   Procedure: INGUINAL LYMPH NODE BIOPSY;  Surgeon: Christene Lye, MD;  Location: [facility] ORS;  Service: General;  Laterality: Left;  . PORTACATH PLACEMENT N/A 07/22/2016   Procedure: INSERTION PORT-A-CATH;  Surgeon: Christene Lye, MD;  Location: [facility] ORS;  Service: General;  Laterality: N/A;  . TONSILLECTOMY AND ADENOIDECTOMY    . TUBAL LIGATION      SOCIAL HISTORY:   Social History   Tobacco Use  . Smoking status: Former Smoker    Packs/day: 1.00    Years: 20.00    Pack years: 20.00    Types: Cigarettes    Last attempt to quit: 02/03/2016    Years since quitting: 1.2  . Smokeless tobacco: Never Used  Substance Use Topics  . Alcohol use: No    Alcohol/week: 0.0 oz    FAMILY HISTORY:   Family History  Problem Relation Age of Onset  . COPD Mother   . Diabetes Mother   . Heart failure Mother   . Alcohol abuse Father   . Kidney disease Father   . Kidney failure Father   . Arthritis Sister   . CAD Maternal  Grandmother   . Stroke Maternal Grandfather   . Arthritis Sister   . Mental illness Sister   . Arthritis Brother     DRUG ALLERGIES:  No Known Allergies  REVIEW OF SYSTEMS:  Review of Systems  Constitutional: Negative for chills, fever and weight loss.  HENT: Negative for ear discharge, ear pain and nosebleeds.   Eyes: Negative for blurred vision, pain and discharge.  Respiratory: Negative for sputum production, shortness of breath, wheezing and stridor.   Cardiovascular: Positive for leg swelling. Negative for chest pain, palpitations, orthopnea and PND.  Gastrointestinal: Negative for abdominal pain, diarrhea, nausea and vomiting.  Genitourinary: Negative for frequency and urgency.  Musculoskeletal: Negative for back pain and joint pain.  Skin:       Bilateral  lower extremity edema and erythema left lower extremity  Neurological: Positive for weakness. Negative for sensory change, speech change and focal weakness.  Psychiatric/Behavioral: Negative for depression and hallucinations. The patient is not nervous/anxious.      MEDICATIONS AT HOME:   Prior to Admission medications   Medication Sig Start Date End Date Taking? Authorizing Provider  BIOTIN PO Take by mouth daily. Pt taking 500 mcg daily   Yes [provider]  buPROPion (WELLBUTRIN XL) 300 MG 24 hr tablet Take 300 mg by mouth every morning.  05/28/16  Yes [provider]  clonazePAM (KLONOPIN) 1 MG tablet Take 1 tablet (1 mg total) by mouth at bedtime. 01/06/17  Yes Sudini, Alveta Heimlich, MD  Diapers & Supplies (HUGGIES PULL-UPS) MISC 1 each by Does not apply route 2 (two) times daily. Dx urinary incontinence, MS; LON 99 months 02/03/17  Yes Lada, Satira Anis, MD  DULoxetine (CYMBALTA) 60 MG capsule Take 1 capsule (60 mg total) by mouth every morning. 10/14/16  Yes Vaughan Basta, MD  gabapentin (NEURONTIN) 600 MG tablet Take 1 tablet (600 mg total) by mouth 3 (three) times daily. 02/09/17  Yes Mody, Ulice Bold, MD  hydrochlorothiazide (HYDRODIURIL) 25 MG tablet Take 0.5 tablets (12.5 mg total) by mouth daily. 04/25/17  Yes Lada, Satira Anis, MD  interferon beta-1a (AVONEX) 30 MCG/0.5ML PSKT injection Inject 30 mcg into the muscle every Monday.   Yes [provider]  lidocaine-prilocaine (EMLA) cream Apply 1 application topically as needed. apply cream over the port site and then place small piece of saran wrap over the cream to protect your clothing 11/07/16  Yes Sindy Guadeloupe, MD  methocarbamol (ROBAXIN) 500 MG tablet Take 1 tablet (500 mg total) by mouth every 8 (eight) hours as needed for muscle spasms. 05/01/17  Yes Lada, Satira Anis, MD  methylphenidate (RITALIN) 10 MG tablet Take 1 tablet (10 mg total) by mouth 2 (two) times daily. Patient taking differently: Take 10 mg by  mouth 2 (two) times daily. At breakfast and at lunchtime 10/14/16  Yes Vaughan Basta, MD  Multiple Vitamin (MULTIVITAMIN WITH MINERALS) TABS tablet Take 1 tablet by mouth every evening.    Yes [provider]  polyethylene glycol powder (GLYCOLAX/MIRALAX) powder Take 17 g by mouth daily as needed for mild constipation. Mixed in water Patient taking differently: Take 17 g by mouth daily as needed for mild constipation. For constipation 01/06/17  Yes Sudini, Alveta Heimlich, MD  QUEtiapine Fumarate (SEROQUEL XR) 150 MG 24 hr tablet Take 150 mg by mouth at bedtime.    Yes [provider]  topiramate (TOPAMAX) 50 MG tablet Take 1 tablet (50 mg total) by mouth daily. Patient taking differently: Take 50 mg by mouth daily with  lunch.  10/14/16  Yes Vaughan Basta, MD  vitamin C (ASCORBIC ACID) 500 MG tablet Take 500 mg by mouth every evening.    Yes [provider]      VITAL SIGNS:  Blood pressure 137/76, pulse 83, temperature 98.5 F (36.9 C), temperature source Oral, resp. rate 15, height 5\' 4"  (1.626 m), weight 131.5 kg (290 lb), SpO2 100 %.  PHYSICAL EXAMINATION:  GENERAL:  67 y.o.-year-old patient lying in the bed with no acute distress.  Morbidly obese EYES: Pupils equal, round, reactive to light and accommodation. No scleral icterus. Extraocular muscles intact.  HEENT: Head atraumatic, normocephalic. Oropharynx and nasopharynx clear.  NECK:  Supple, no jugular venous distention. No thyroid enlargement, no tenderness.  LUNGS: Normal breath sounds bilaterally, no wheezing, rales,rhonchi or crepitation. No use of accessory muscles of respiration.  CARDIOVASCULAR: S1, S2 normal. No murmurs, rubs, or gallops.  ABDOMEN: Soft, nontender, nondistended. Bowel sounds present. No organomegaly or mass.  EXTREMITIES: 3+ bilateral pedal edema, no cyanosis, or clubbing.  NEUROLOGIC: Cranial nerves II through XII are intact. Muscle strength 5/5 in all extremities. Sensation  intact. Gait not checked.  PSYCHIATRIC: The patient is alert and oriented x 3.  SKIN: Colitis left lower extremity with significant dry skin and cracking in between web spaces  LABORATORY PANEL:   CBC Recent Labs  Lab 05/13/17 1341  WBC 7.8  HGB 13.3  HCT 39.9  PLT 282   ------------------------------------------------------------------------------------------------------------------  Chemistries  Recent Labs  Lab 05/13/17 1341  NA 138  K 3.3*  CL 103  CO2 23  GLUCOSE 127*  BUN 15  CREATININE 1.17*  CALCIUM 9.4  AST 27  ALT 21  ALKPHOS 74  BILITOT 0.4   ------------------------------------------------------------------------------------------------------------------  Cardiac Enzymes No results for input(s): TROPONINI in the last 168 hours. ------------------------------------------------------------------------------------------------------------------  RADIOLOGY:  No results found.  EKG:    IMPRESSION AND PLAN:    Derrick Orris  is a 67 y.o. female with a known history of COPD, depression, anxiety, lower extremity edema chronic and history of B-cell follicular lymphoma grade 2 followed at the cancer center comes to the emergency room with a caregiver/friend with left lower extremity redness and bilateral lower extremity edema.   1.  Recurrent cellulitis left lower extreme or the right -Similar presentation in December discharged on 04/05/2017 with 21-day course of Keflex -Admit to medical floor for overnight observation -IV clindamycin 600 3 times daily -Keep leg elevated -Follow blood culture  2.  Chronic bilateral lower extremity edema -3 doses of Lasix to see if edema can improve -Patient does not appear to be more active at home.  She has used Haematologist in the past -Hold hydrochlorothiazide for now  3.  Morbid obesity  4.  History of multiple sclerosis with chronic indwelling Foley catheter suspected due to neurogenic bladder -Follows with Dr. Manuella Ghazi  neurology -He takes injection Avonex every Monday... Will administer shot once family brings her tomorrow  39.  DVT prophylaxis subcu Lovenox  6.  PT consult for ambulation  Discussed with patient and caregiver if patient continues to improve she will be discharged tomorrow    All the records are reviewed and case discussed with ED provider. Management plans discussed with the patient, family and they are in agreement.  CODE STATUS: Full TOTAL TIME TAKING CARE OF THIS PATIENT: *50* minutes.    Fritzi Mandes M.D on 05/13/2017 at 4:51 PM  Between 7am to 6pm - Pager - [phone]  After 6pm go to [email or web address] -  password [email or web address] [facility]  [facility]  Office  [phone]  CC: Primary care physician; Arnetha Courser, MD

## 2017-05-13 NOTE — ED Triage Notes (Addendum)
Pt to ED with redness to left leg for the past 3 days. Pt reports a hx of cellulitis. Area is warm to the touch but is not weeping and no ulcers noted. Pt also reports having abd pain intermittently for the past 2 weeks with "white stool that smells very bad" Pt reports she has been having 1 to less than one BM a day though. PT also reports having an odor to her urine. Pt has had a foley for the past 3 years that family reports is due to be changed. No fevers reported at home. No NV reported.

## 2017-05-14 ENCOUNTER — Other Ambulatory Visit: Payer: Self-pay

## 2017-05-14 DIAGNOSIS — F329 Major depressive disorder, single episode, unspecified: Secondary | ICD-10-CM | POA: Diagnosis not present

## 2017-05-14 DIAGNOSIS — L0390 Cellulitis, unspecified: Secondary | ICD-10-CM | POA: Diagnosis present

## 2017-05-14 DIAGNOSIS — L03116 Cellulitis of left lower limb: Secondary | ICD-10-CM | POA: Diagnosis not present

## 2017-05-14 DIAGNOSIS — I10 Essential (primary) hypertension: Secondary | ICD-10-CM | POA: Diagnosis not present

## 2017-05-14 DIAGNOSIS — G35 Multiple sclerosis: Secondary | ICD-10-CM | POA: Diagnosis not present

## 2017-05-14 LAB — BASIC METABOLIC PANEL
Anion gap: 12 (ref 5–15)
BUN: 16 mg/dL (ref 6–20)
CO2: 24 mmol/L (ref 22–32)
Calcium: 9 mg/dL (ref 8.9–10.3)
Chloride: 101 mmol/L (ref 101–111)
Creatinine, Ser: 1.22 mg/dL — ABNORMAL HIGH (ref 0.44–1.00)
GFR calc Af Amer: 52 mL/min — ABNORMAL LOW (ref >60)
GFR calc non Af Amer: 45 mL/min — ABNORMAL LOW (ref >60)
Glucose, Bld: 135 mg/dL — ABNORMAL HIGH (ref 65–99)
Potassium: 3 mmol/L — ABNORMAL LOW (ref 3.5–5.1)
Sodium: 137 mmol/L (ref 135–145)

## 2017-05-14 LAB — CBC
HCT: 37.1 % (ref 35.0–47.0)
Hemoglobin: 12.4 g/dL (ref 12.0–16.0)
MCH: 29.6 pg (ref 26.0–34.0)
MCHC: 33.3 g/dL (ref 32.0–36.0)
MCV: 89 fL (ref 80.0–100.0)
Platelets: 271 10*3/uL (ref 150–440)
RBC: 4.17 MIL/uL (ref 3.80–5.20)
RDW: 16 % — ABNORMAL HIGH (ref 11.5–14.5)
WBC: 7.4 10*3/uL (ref 3.6–11.0)

## 2017-05-14 MED ORDER — ACETAMINOPHEN 650 MG RE SUPP: 650.0000 mg | Freq: Four times a day (QID) | RECTAL | Status: DC | PRN

## 2017-05-14 MED ORDER — ONDANSETRON HCL 4 MG PO TABS: 4.0000 mg | ORAL_TABLET | Freq: Four times a day (QID) | ORAL | Status: DC | PRN

## 2017-05-14 MED ORDER — QUETIAPINE FUMARATE ER 50 MG PO TB24
150.0000 mg | ORAL_TABLET | Freq: Every day | ORAL | Status: DC
  Administered 2017-05-14 (×2): 150 mg via ORAL
  Filled 2017-05-14 (×3): qty 3

## 2017-05-14 MED ORDER — ADULT MULTIVITAMIN W/MINERALS CH
1.0000 | ORAL_TABLET | Freq: Every evening | ORAL | Status: DC
  Administered 2017-05-14: 1 via ORAL
  Filled 2017-05-14: qty 1

## 2017-05-14 MED ORDER — METHOCARBAMOL 500 MG PO TABS: 500.0000 mg | ORAL_TABLET | Freq: Three times a day (TID) | ORAL | Status: DC | PRN

## 2017-05-14 MED ORDER — ONDANSETRON HCL 4 MG/2ML IJ SOLN: 4.0000 mg | Freq: Four times a day (QID) | INTRAMUSCULAR | Status: DC | PRN

## 2017-05-14 MED ORDER — ACETAMINOPHEN 325 MG PO TABS: 650.0000 mg | ORAL_TABLET | Freq: Four times a day (QID) | ORAL | Status: DC | PRN

## 2017-05-14 MED ORDER — OXYCODONE HCL 5 MG PO TABS
5.0000 mg | ORAL_TABLET | ORAL | Status: DC | PRN
  Administered 2017-05-14 (×2): 5 mg via ORAL
  Filled 2017-05-14 (×2): qty 1

## 2017-05-14 MED ORDER — ENOXAPARIN SODIUM 40 MG/0.4ML ~~LOC~~ SOLN
40.0000 mg | SUBCUTANEOUS | Status: DC
  Administered 2017-05-14 – 2017-05-15 (×2): 40 mg via SUBCUTANEOUS
  Filled 2017-05-14 (×2): qty 0.4

## 2017-05-14 MED ORDER — BUPROPION HCL ER (XL) 150 MG PO TB24
300.0000 mg | ORAL_TABLET | ORAL | Status: DC
  Administered 2017-05-14 – 2017-05-15 (×2): 300 mg via ORAL
  Filled 2017-05-14 (×2): qty 2

## 2017-05-14 MED ORDER — DULOXETINE HCL 60 MG PO CPEP
60.0000 mg | ORAL_CAPSULE | ORAL | Status: DC
  Administered 2017-05-15: 60 mg via ORAL
  Filled 2017-05-14 (×2): qty 1

## 2017-05-14 MED ORDER — MORPHINE SULFATE (PF) 2 MG/ML IV SOLN
2.0000 mg | Freq: Once | INTRAVENOUS | Status: AC
  Administered 2017-05-14: 2 mg via INTRAVENOUS
  Filled 2017-05-14: qty 1

## 2017-05-14 MED ORDER — POLYETHYLENE GLYCOL 3350 17 GM/SCOOP PO POWD
17.0000 g | Freq: Every day | ORAL | Status: DC | PRN
  Filled 2017-05-14: qty 255

## 2017-05-14 MED ORDER — METHYLPHENIDATE HCL 5 MG PO TABS
10.0000 mg | ORAL_TABLET | Freq: Two times a day (BID) | ORAL | Status: DC
  Administered 2017-05-14 – 2017-05-15 (×4): 10 mg via ORAL
  Filled 2017-05-14 (×4): qty 2

## 2017-05-14 MED ORDER — GABAPENTIN 600 MG PO TABS
600.0000 mg | ORAL_TABLET | Freq: Three times a day (TID) | ORAL | Status: DC
  Administered 2017-05-14 – 2017-05-15 (×4): 600 mg via ORAL
  Filled 2017-05-14 (×6): qty 1

## 2017-05-14 MED ORDER — VITAMIN C 500 MG PO TABS
500.0000 mg | ORAL_TABLET | Freq: Every evening | ORAL | Status: DC
  Administered 2017-05-14: 500 mg via ORAL
  Filled 2017-05-14 (×2): qty 1

## 2017-05-14 MED ORDER — TOPIRAMATE 25 MG PO TABS
50.0000 mg | ORAL_TABLET | Freq: Every day | ORAL | Status: DC
  Administered 2017-05-14 – 2017-05-15 (×2): 50 mg via ORAL
  Filled 2017-05-14 (×2): qty 2

## 2017-05-14 MED ORDER — FUROSEMIDE 10 MG/ML IJ SOLN
20.0000 mg | Freq: Two times a day (BID) | INTRAMUSCULAR | Status: AC
  Administered 2017-05-14 – 2017-05-15 (×3): 20 mg via INTRAVENOUS
  Filled 2017-05-14 (×3): qty 4

## 2017-05-14 MED ORDER — CLONAZEPAM 0.5 MG PO TABS
1.0000 mg | ORAL_TABLET | Freq: Every day | ORAL | Status: DC
  Administered 2017-05-14 (×2): 1 mg via ORAL
  Filled 2017-05-14 (×2): qty 2

## 2017-05-14 MED ORDER — POTASSIUM CHLORIDE 20 MEQ PO PACK
60.0000 meq | PACK | Freq: Once | ORAL | Status: AC
  Administered 2017-05-14: 60 meq via ORAL
  Filled 2017-05-14: qty 3

## 2017-05-14 MED ORDER — HYDROCHLOROTHIAZIDE 25 MG PO TABS
25.0000 mg | ORAL_TABLET | Freq: Every day | ORAL | Status: DC
Start: 2017-05-14 — End: 2017-05-15
  Administered 2017-05-14 – 2017-05-15 (×2): 25 mg via ORAL
  Filled 2017-05-14 (×2): qty 1

## 2017-05-14 MED ORDER — INTERFERON BETA-1A 30 MCG/0.5ML IM PSKT: 30.0000 ug | PREFILLED_SYRINGE | INTRAMUSCULAR | Status: DC

## 2017-05-14 MED ORDER — POLYETHYLENE GLYCOL 3350 17 G PO PACK: 17.0000 g | PACK | Freq: Every day | ORAL | Status: DC | PRN

## 2017-05-14 NOTE — Plan of Care (Signed)
  Education: Knowledge of General Education information will improve 05/14/2017 0456 - Progressing by Redina Zeller, Lucille Passy, RN   Health Behavior/Discharge Planning: Ability to manage health-related needs will improve 05/14/2017 0456 - Progressing by Usha Slager, Lucille Passy, RN   Clinical Measurements: Ability to maintain clinical measurements within normal limits will improve 05/14/2017 0456 - Progressing by Suezette Lafave, Lucille Passy, RN Will remain free from infection 05/14/2017 0456 - Progressing by Gavyn Ybarra, Lucille Passy, RN Diagnostic test results will improve 05/14/2017 0456 - Progressing by Nethaniel Mattie, Lucille Passy, RN Respiratory complications will improve 05/14/2017 0456 - Progressing by Bryna Colander, RN Cardiovascular complication will be avoided 05/14/2017 0456 - Progressing by Dreshon Proffit, Lucille Passy, RN

## 2017-05-14 NOTE — Progress Notes (Signed)
Patient alert and oriented, vss.  Pain in legs relieved with oxycodone.  Cellulitis is improved, TED hose on.  PT to see tomorrow.  No acute events during shift.  Continue to monitor.

## 2017-05-14 NOTE — Care Management Obs Status (Signed)
[facility] NOTIFICATION   Patient Details  Name: [name] MRN: [id number] Date of Birth: Dec 03, 1950   Medicare Observation Status Notification Given:  Yes    Jolly Mango, RN 05/14/2017, 2:25 PM

## 2017-05-14 NOTE — ED Notes (Signed)
Called floor to see if room was clean. They asked for another 15 minutes. Made [facility] crew ) aware.

## 2017-05-14 NOTE — Progress Notes (Addendum)
[place] [facility] [place] NAME: [name]    MR#:  [id number]  DATE OF BIRTH:  [birth year]  SUBJECTIVE:  CHIEF COMPLAINT:   Chief Complaint  Patient presents with  . Leg Swelling  . Abdominal Pain  Patient without complaint, no events overnight per nursing staff  REVIEW OF SYSTEMS:  CONSTITUTIONAL: No fever, fatigue or weakness.  EYES: No blurred or double vision.  EARS, NOSE, AND THROAT: No tinnitus or ear pain.  RESPIRATORY: No cough, shortness of breath, wheezing or hemoptysis.  CARDIOVASCULAR: No chest pain, orthopnea, edema.  GASTROINTESTINAL: No nausea, vomiting, diarrhea or abdominal pain.  GENITOURINARY: No dysuria, hematuria.  ENDOCRINE: No polyuria, nocturia,  HEMATOLOGY: No anemia, easy bruising or bleeding SKIN: No rash or lesion. MUSCULOSKELETAL: No joint pain or arthritis.   NEUROLOGIC: No tingling, numbness, weakness.  PSYCHIATRY: No anxiety or depression.   ROS  DRUG ALLERGIES:  No Known Allergies  VITALS:  Blood pressure (!) 143/64, pulse 64, temperature 100 F (37.8 C), temperature source Oral, resp. rate 18, height 5\' 4"  (1.626 m), weight 131.5 kg (290 lb), SpO2 99 %.  PHYSICAL EXAMINATION:  GENERAL:  67 y.o.-year-old patient lying in the bed with no acute distress.  EYES: Pupils equal, round, reactive to light and accommodation. No scleral icterus. Extraocular muscles intact.  HEENT: Head atraumatic, normocephalic. Oropharynx and nasopharynx clear.  NECK:  Supple, no jugular venous distention. No thyroid enlargement, no tenderness.  LUNGS: Normal breath sounds bilaterally, no wheezing, rales,rhonchi or crepitation. No use of accessory muscles of respiration.  CARDIOVASCULAR: S1, S2 normal. No murmurs, rubs, or gallops.  ABDOMEN: Soft, nontender, nondistended. Bowel sounds present. No organomegaly or mass.  EXTREMITIES: No pedal edema, cyanosis, or clubbing.  NEUROLOGIC: Cranial nerves II through XII are intact.  Muscle strength 5/5 in all extremities. Sensation intact. Gait not checked.  PSYCHIATRIC: The patient is alert and oriented x 3.  SKIN: Improving extensive lower extremity cellulitis   Physical Exam LABORATORY PANEL:   CBC Recent Labs  Lab 05/14/17 0425  WBC 7.4  HGB 12.4  HCT 37.1  PLT 271   ------------------------------------------------------------------------------------------------------------------  Chemistries  Recent Labs  Lab 05/13/17 1341 05/14/17 0425  NA 138 137  K 3.3* 3.0*  CL 103 101  CO2 23 24  GLUCOSE 127* 135*  BUN 15 16  CREATININE 1.17* 1.22*  CALCIUM 9.4 9.0  AST 27  --   ALT 21  --   ALKPHOS 74  --   BILITOT 0.4  --    ------------------------------------------------------------------------------------------------------------------  Cardiac Enzymes No results for input(s): TROPONINI in the last 168 hours. ------------------------------------------------------------------------------------------------------------------  RADIOLOGY:  No results found.  ASSESSMENT AND PLAN:   [name]  is a 67 y.o. female with a known history of COPD, depression, anxiety, lower extremity edema chronic and history of B-cell follicular lymphoma grade 2 followed at the cancer center comes to the emergency room with a caregiver/friend with left lower extremity redness and bilateral lower extremity edema.   1 acute recurrent bilateral lower extremity cellulitis, left greater than right  Resolving  Similar presentation in December discharged on 04/05/2017 with 21-day course of Keflex Continue IV clindamycin 600 3 times daily, leg elevation, follow-up on cultures, TED hose twice daily   2 chronic bilateral lower extremity edema Improved on trial course of Lasix She has used Haematologist in the past Restart hydrochlorothiazide  3 chronic morbid obesity Most likely secondary to excess calories Lifestyle modification recommended  4 hx of  MS w/ chronic  indwelling Foley catheter  Suspect due to neurogenic bladder Follows with Dr. Manuella Ghazi neurology She takes injection Avonex every Monday Will administer shot once family brings in her medication  5 acute hypokalemia Most likely secondary to diuretic use Replete with p.o. potassium and check BMP in the morning   All the records are reviewed and case discussed with Care Management/Social Workerr. Management plans discussed with the patient, family and they are in agreement.  CODE STATUS: full  TOTAL TIME TAKING CARE OF THIS PATIENT: 40minutes.   POSSIBLE D/C IN 1-2 DAYS, DEPENDING ON CLINICAL CONDITION.   Avel Peace Salary M.D on 05/14/2017   Between 7am to 6pm - Pager - [phone]  After 6pm go to [email or web address] - password [email or web address] [facility]  Office  [phone]  CC: Primary care physician; Arnetha Courser, MD  Note: This dictation was prepared with Dragon dictation along with smaller phrase technology. Any transcriptional errors that result from this process are unintentional.

## 2017-05-14 NOTE — Care Management (Addendum)
Patient follow by [facility] for RN, PT, OT and HHA. [place] with [facility] notified of admission

## 2017-05-15 DIAGNOSIS — L03116 Cellulitis of left lower limb: Secondary | ICD-10-CM | POA: Diagnosis not present

## 2017-05-15 DIAGNOSIS — I10 Essential (primary) hypertension: Secondary | ICD-10-CM | POA: Diagnosis not present

## 2017-05-15 DIAGNOSIS — F329 Major depressive disorder, single episode, unspecified: Secondary | ICD-10-CM | POA: Diagnosis not present

## 2017-05-15 DIAGNOSIS — G35 Multiple sclerosis: Secondary | ICD-10-CM | POA: Diagnosis not present

## 2017-05-15 LAB — BASIC METABOLIC PANEL
Anion gap: 9 (ref 5–15)
BUN: 14 mg/dL (ref 6–20)
CO2: 28 mmol/L (ref 22–32)
Calcium: 8.8 mg/dL — ABNORMAL LOW (ref 8.9–10.3)
Chloride: 99 mmol/L — ABNORMAL LOW (ref 101–111)
Creatinine, Ser: 1.16 mg/dL — ABNORMAL HIGH (ref 0.44–1.00)
GFR calc Af Amer: 56 mL/min — ABNORMAL LOW (ref >60)
GFR calc non Af Amer: 48 mL/min — ABNORMAL LOW (ref >60)
Glucose, Bld: 129 mg/dL — ABNORMAL HIGH (ref 65–99)
Potassium: 2.8 mmol/L — ABNORMAL LOW (ref 3.5–5.1)
Sodium: 136 mmol/L (ref 135–145)

## 2017-05-15 LAB — URINE CULTURE

## 2017-05-15 LAB — MAGNESIUM: Magnesium: 1.7 mg/dL (ref 1.7–2.4)

## 2017-05-15 MED ORDER — POTASSIUM CHLORIDE 20 MEQ PO PACK
20.0000 meq | PACK | Freq: Once | ORAL | Status: AC
  Administered 2017-05-15: 20 meq via ORAL
  Filled 2017-05-15: qty 1

## 2017-05-15 MED ORDER — POTASSIUM CHLORIDE 20 MEQ PO PACK: 40.0000 meq | PACK | Freq: Once | ORAL | Status: DC

## 2017-05-15 MED ORDER — POTASSIUM CHLORIDE 20 MEQ PO PACK: 40.0000 meq | PACK | Freq: Once | ORAL | 0 refills | Status: DC

## 2017-05-15 MED ORDER — POTASSIUM CHLORIDE ER 10 MEQ PO TBCR: 20.0000 meq | EXTENDED_RELEASE_TABLET | Freq: Every day | ORAL | 0 refills | Status: DC

## 2017-05-15 MED ORDER — LACTINEX PO CHEW: 1.0000 | CHEWABLE_TABLET | Freq: Three times a day (TID) | ORAL | 0 refills | Status: DC

## 2017-05-15 MED ORDER — CLINDAMYCIN HCL 300 MG PO CAPS: 300.0000 mg | ORAL_CAPSULE | Freq: Three times a day (TID) | ORAL | 0 refills | Status: DC

## 2017-05-15 MED ORDER — POTASSIUM CHLORIDE 20 MEQ PO PACK
40.0000 meq | PACK | Freq: Once | ORAL | Status: AC
  Administered 2017-05-15: 40 meq via ORAL
  Filled 2017-05-15: qty 2

## 2017-05-15 MED ORDER — POTASSIUM CHLORIDE 20 MEQ PO PACK: 60.0000 meq | PACK | Freq: Once | ORAL | Status: DC

## 2017-05-15 MED ORDER — CLINDAMYCIN HCL 300 MG PO CAPS: 600.0000 mg | ORAL_CAPSULE | Freq: Three times a day (TID) | ORAL | 0 refills | Status: AC

## 2017-05-15 NOTE — Evaluation (Signed)
Physical Therapy Evaluation Patient Details Name: [name] MRN: [id number] DOB: Sep 25, 1950 Today's Date: 05/15/2017   History of Present Illness  Pt admitted for cellulitis.  PMH includes COPD, depression, anxiety, LE edema, B-cell follicular lymphoma, MS (relapsing-remitting per pt.), kyphoscoliosis, ADHD, dyspnea, abdominal aortic atherosclerosis and edema.  Clinical Impression  Pt is a 67 year old female who lives in a split level home with her husband.  Pt demonstrated decreased strength of UE and LE, L weaker than R, and reported decreased sensation in L LE.  Pt required min A for bed mobility and transfers with RW.  Pt attempted to take 2 steps using RW and CGA and stated that she was not able to continue due to fatigue.  Pt appeared to be agitated but reported no pain.  Pt will continue to benefit from skilled PT with focus on strength, functional mobility and tolerance to activity.    Follow Up Recommendations Home health PT    Equipment Recommendations       Recommendations for Other Services       Precautions / Restrictions Precautions Precautions: Fall      Mobility  Bed Mobility Overal bed mobility: Needs Assistance Bed Mobility: Supine to Sit     Supine to sit: Min assist     General bed mobility comments: Pt required increased time, use of bedrail and min A from PT to move LE's over EOB.  Transfers Overall transfer level: Needs assistance Equipment used: Rolling walker (2 wheeled) Transfers: Sit to/from Stand Sit to Stand: Min assist         General transfer comment: Pt required min A for initiation of STS and close guard once standing due to pt appearance of being unstable and uncomfortable.  Pt did not report pain when asked.  Ambulation/Gait Ambulation/Gait assistance: Min assist Ambulation Distance (Feet): 2 Feet Assistive device: Rolling walker (2 wheeled)     Gait velocity interpretation: Below normal speed for age/gender General Gait  Details: Pt attempted two steps and reported that she was too fatigued from ambulating to the bathroom earlier.  Pt reported experiencing a spasm in her R LE at this time as well.  Stairs            Wheelchair Mobility    Modified Rankin (Stroke Patients Only)       Balance Overall balance assessment: Needs assistance Sitting-balance support: Bilateral upper extremity supported       Standing balance support: Bilateral upper extremity supported                                 Pertinent Vitals/Pain Pain Assessment: No/denies pain(Sensation: L: decreased, R: intact)    Home Living Family/patient expects to be discharged to:: Private residence Living Arrangements: Spouse/significant other Available Help at Discharge: Family;Friend(s) Type of Home: House Home Access: Ramped entrance Entrance Stairs-Rails: Right Entrance Stairs-Number of Steps: ramp Home Layout: Two level Home Equipment: Shower seat;Toilet riser      Prior Function Level of Independence: Needs assistance   Gait / Transfers Assistance Needed: Requires assistance to get up from chair  ADL's / Homemaking Assistance Needed: Assistance with bathing and dressing        Hand Dominance        Extremity/Trunk Assessment   Upper Extremity Assessment Upper Extremity Assessment: Generalized weakness    Lower Extremity Assessment Lower Extremity Assessment: Generalized weakness    Cervical / Trunk Assessment Cervical /  Trunk Assessment: Kyphotic  Communication   Communication: No difficulties  Cognition Arousal/Alertness: Awake/alert Behavior During Therapy: Restless Overall Cognitive Status: Within Functional Limits for tasks assessed                                 General Comments: Pt was emotionally labile and stated that she was overwhelmed and exhausted with "everything".  Pt did not report pain but kept looking at her legs and rubbing them as though she was in  pain.      General Comments      Exercises     Assessment/Plan    PT Assessment Patient needs continued PT services  PT Problem List Decreased strength;Decreased activity tolerance;Decreased balance;Decreased mobility;Impaired sensation       PT Treatment Interventions DME instruction;Gait training;Stair training;Functional mobility training;Therapeutic activities;Therapeutic exercise;Balance training;Neuromuscular re-education;Patient/family education    PT Goals (Current goals can be found in the Care Plan section)  Acute Rehab PT Goals Patient Stated Goal: to regain strength so that she can be more mobile PT Goal Formulation: With patient/family Time For Goal Achievement: 05/29/17 Potential to Achieve Goals: Fair    Frequency Min 2X/week   Barriers to discharge        Co-evaluation               AM-PAC PT "6 Clicks" Daily Activity  Outcome Measure Difficulty turning over in bed (including adjusting bedclothes, sheets and blankets)?: A Lot Difficulty moving from lying on back to sitting on the side of the bed? : A Lot Difficulty sitting down on and standing up from a chair with arms (e.g., wheelchair, bedside commode, etc,.)?: A Lot Help needed moving to and from a bed to chair (including a wheelchair)?: A Lot Help needed walking in hospital room?: A Lot Help needed climbing 3-5 steps with a railing? : A Lot 6 Click Score: 12    End of Session Equipment Utilized During Treatment: Gait belt Activity Tolerance: Patient limited by fatigue Patient left: in bed;with bed alarm set;with family/visitor present;with call bell/phone within reach Nurse Communication: Mobility status PT Visit Diagnosis: Unsteadiness on feet (R26.81);Muscle weakness (generalized) (M62.81);Other symptoms and signs involving the nervous system (R29.898)    Time: 1340-1420 PT Time Calculation (min) (ACUTE ONLY): 40 min   Charges:   PT Evaluation $PT Eval Moderate Complexity: 1 Mod      PT G Codes:   PT G-Codes **NOT FOR INPATIENT CLASS** Functional Assessment Tool Used: AM-PAC 6 Clicks Basic Mobility Functional Limitation: Mobility: Walking and moving around Mobility: Walking and Moving Around Current Status (I9485): At least 60 percent but less than 80 percent impaired, limited or restricted Mobility: Walking and Moving Around Goal Status [phone]): At least 1 percent but less than 20 percent impaired, limited or restricted    Roxanne Gates, PT, DPT   Roxanne Gates 05/15/2017, 4:28 PM

## 2017-05-15 NOTE — Care Management (Signed)
[facility], [place],  notified of discharge. Requested they add SW for assistance with Medicaid

## 2017-05-15 NOTE — Progress Notes (Signed)
DISCHARGE NOTE:  Pt given discharge instructions. Pt verbalized understanding. Pt wheeled to car by staff.  

## 2017-05-15 NOTE — Discharge Summary (Addendum)
[facility] NAME: [name]    MR#:  [id number]  DATE OF BIRTH:  07/07/1950  DATE OF ADMISSION:  05/13/2017 ADMITTING PHYSICIAN: Fritzi Mandes, MD  DATE OF DISCHARGE: No discharge date for patient encounter.  PRIMARY CARE PHYSICIAN: Arnetha Courser, MD    ADMISSION DIAGNOSIS:  Cellulitis of left lower extremity [V76.160]  DISCHARGE DIAGNOSIS:  Active Problems:   Cellulitis   SECONDARY DIAGNOSIS:   Past Medical History:  Diagnosis Date  . Abdominal aortic atherosclerosis ([place]) 11/11/2016  . ADHD   . Anxiety   . COPD (chronic obstructive pulmonary disease) ([place])   . Depression    major depressive  . Dyspnea    doe  . Edema    left leg  . Follicular lymphoma ([place])    B Cell  . Follicular lymphoma grade II ([place])   . Hypertension   . Hypotension    idiopathic  . Kyphoscoliosis and scoliosis 11/26/2011  . Morbid obesity ([place]) 01/05/2016  . Multiple sclerosis ([place])   . Multiple sclerosis ([place])    1980's  . Neuromuscular disorder ([place])   . Obstructive and reflux uropathy    foley  . Pain    atypical facial  . Peripheral vascular disease of lower extremity with ulceration ([place]) 11/08/2015  . Skin ulcer ([place]) 11/08/2015  . Weakness    generalized. has MS    HOSPITAL COURSE:  BettyWolfeis a67 y.o.femalewith a known history of COPD, depression, anxiety, lower extremity edema chronic and history of B-cell follicular lymphoma grade 2 followed at the cancer center comes to the emergency room with a caregiver/friend with left lower extremity redness and bilateral lower extremity edema.   1 acute recurrent bilateral lower extremity cellulitis, left greater than right  Resolving  Similar presentation in December discharged on 04/05/2017 with 21-day course of Keflex Treated with IV clindamycin while in house, and patient did well  2 chronic bilateral lower extremity edema Improved on trial course of Lasix She  has used Haematologist in the past  3 chronic morbid obesity Most likely secondary to excess calories Lifestyle modification recommended  4 hx of MS w/ chronic indwelling Foley catheter  Suspect due to neurogenic bladder Follows with Dr. Manuella Ghazi neurology She takes injection Avonex every Monday Will administer shot once family brings in her medication  5 acute hypokalemia Repleted with p.o. potassium, will also be discharged home on p.o. potassium, to follow-up primary care provider into 3 days for reevaluation   DISCHARGE CONDITIONS:  On the day of discharge patient is afebrile, stable, tolerating diet, ready for discharge home in the care of family, for more specific details please see chart, to follow-up primary care provider to 3 days for evaluation  CONSULTS OBTAINED:    DRUG ALLERGIES:  No Known Allergies  DISCHARGE MEDICATIONS:   Allergies as of 05/15/2017   No Known Allergies     Medication List    TAKE these medications   BIOTIN PO Take by mouth daily. Pt taking 500 mcg daily   buPROPion 300 MG 24 hr tablet Commonly known as:  WELLBUTRIN XL Take 300 mg by mouth every morning.   clindamycin 300 MG capsule Commonly known as:  CLEOCIN Take 2 capsules (600 mg total) by mouth 3 (three) times daily for 10 days.   clonazePAM 1 MG tablet Commonly known as:  KLONOPIN Take 1 tablet (1 mg total) by mouth at bedtime.   DULoxetine 60 MG capsule Commonly known  as:  CYMBALTA Take 1 capsule (60 mg total) by mouth every morning.   gabapentin 600 MG tablet Commonly known as:  NEURONTIN Take 1 tablet (600 mg total) by mouth 3 (three) times daily.   HUGGIES PULL-UPS Misc 1 each by Does not apply route 2 (two) times daily. Dx urinary incontinence, MS; LON 99 months   hydrochlorothiazide 25 MG tablet Commonly known as:  HYDRODIURIL Take 0.5 tablets (12.5 mg total) by mouth daily.   interferon beta-1a 30 MCG/0.5ML Pskt injection Commonly known as:  AVONEX Inject 30 mcg  into the muscle every Monday.   lactobacillus acidophilus & bulgar chewable tablet Chew 1 tablet by mouth 3 (three) times daily with meals.   lidocaine-prilocaine cream Commonly known as:  EMLA Apply 1 application topically as needed. apply cream over the port site and then place small piece of saran wrap over the cream to protect your clothing   methocarbamol 500 MG tablet Commonly known as:  ROBAXIN Take 1 tablet (500 mg total) by mouth every 8 (eight) hours as needed for muscle spasms.   methylphenidate 10 MG tablet Commonly known as:  RITALIN Take 1 tablet (10 mg total) by mouth 2 (two) times daily. What changed:  additional instructions   multivitamin with minerals Tabs tablet Take 1 tablet by mouth every evening.   polyethylene glycol powder powder Commonly known as:  GLYCOLAX/MIRALAX Take 17 g by mouth daily as needed for mild constipation. Mixed in water What changed:  additional instructions   potassium chloride 10 MEQ tablet Commonly known as:  K-DUR Take 2 tablets (20 mEq total) by mouth daily.   potassium chloride 20 MEQ packet Commonly known as:  KLOR-CON Take 40 mEq by mouth once for 1 dose.   QUEtiapine Fumarate 150 MG 24 hr tablet Commonly known as:  SEROQUEL XR Take 150 mg by mouth at bedtime.   topiramate 50 MG tablet Commonly known as:  TOPAMAX Take 1 tablet (50 mg total) by mouth daily. What changed:  when to take this   vitamin C 500 MG tablet Commonly known as:  ASCORBIC ACID Take 500 mg by mouth every evening.        DISCHARGE INSTRUCTIONS:    If you experience worsening of your admission symptoms, develop shortness of breath, life threatening emergency, suicidal or homicidal thoughts you must seek medical attention immediately by calling 911 or calling your MD immediately  if symptoms less severe.  You Must read complete instructions/literature along with all the possible adverse reactions/side effects for all the Medicines you take and  that have been prescribed to you. Take any new Medicines after you have completely understood and accept all the possible adverse reactions/side effects.   Please note  You were cared for by a hospitalist during your hospital stay. If you have any questions about your discharge medications or the care you received while you were in the hospital after you are discharged, you can call the unit and asked to speak with the hospitalist on call if the hospitalist that took care of you is not available. Once you are discharged, your primary care physician will handle any further medical issues. Please note that NO REFILLS for any discharge medications will be authorized once you are discharged, as it is imperative that you return to your primary care physician (or establish a relationship with a primary care physician if you do not have one) for your aftercare needs so that they can reassess your need for medications and monitor your  lab values.    Today   CHIEF COMPLAINT:   Chief Complaint  Patient presents with  . Leg Swelling  . Abdominal Pain    HISTORY OF PRESENT ILLNESS:  Denyce Harr  is a 67 y.o. female with a known history of COPD, depression, anxiety, lower extremity edema chronic and history of B-cell follicular lymphoma grade 2 followed at the cancer center comes to the emergency room with a caregiver/friend with left lower extremity redness and bilateral lower extremity edema.  Patient was admitted in December for similar symptoms and was prescribed  a 21-day course of Keflex. She is afebrile.  White count is normal.  Blood pressure stable.  She is being admitted with recurrent cellulitis.  In the ER she received IV clindamycin  She has history of multiple sclerosis.  She has chronic Foley catheter.  At baseline she ambulates using a walker.  She needs a lot of help which is provided by her husband and friend/caregiver     VITAL SIGNS:  Blood pressure 131/64, pulse 88,  temperature 98.6 F (37 C), temperature source Oral, resp. rate 18, height 5\' 4"  (1.626 m), weight 131.5 kg (290 lb), SpO2 93 %.  I/O:    Intake/Output Summary (Last 24 hours) at 05/15/2017 1234 Last data filed at 05/15/2017 1056 Gross per 24 hour  Intake 520 ml  Output 3250 ml  Net -2730 ml    PHYSICAL EXAMINATION:  GENERAL:  67 y.o.-year-old patient lying in the bed with no acute distress.  EYES: Pupils equal, round, reactive to light and accommodation. No scleral icterus. Extraocular muscles intact.  HEENT: Head atraumatic, normocephalic. Oropharynx and nasopharynx clear.  NECK:  Supple, no jugular venous distention. No thyroid enlargement, no tenderness.  LUNGS: Normal breath sounds bilaterally, no wheezing, rales,rhonchi or crepitation. No use of accessory muscles of respiration.  CARDIOVASCULAR: S1, S2 normal. No murmurs, rubs, or gallops.  ABDOMEN: Soft, non-tender, non-distended. Bowel sounds present. No organomegaly or mass.  EXTREMITIES: No pedal edema, cyanosis, or clubbing.  NEUROLOGIC: Cranial nerves II through XII are intact. Muscle strength 5/5 in all extremities. Sensation intact. Gait not checked.  PSYCHIATRIC: The patient is alert and oriented x 3.  SKIN: No obvious rash, lesion, or ulcer.   DATA REVIEW:   CBC Recent Labs  Lab 05/14/17 0425  WBC 7.4  HGB 12.4  HCT 37.1  PLT 271    Chemistries  Recent Labs  Lab 05/13/17 1341  05/15/17 0640 05/15/17 0931  NA 138   < > 136  --   K 3.3*   < > 2.8*  --   CL 103   < > 99*  --   CO2 23   < > 28  --   GLUCOSE 127*   < > 129*  --   BUN 15   < > 14  --   CREATININE 1.17*   < > 1.16*  --   CALCIUM 9.4   < > 8.8*  --   MG  --   --   --  1.7  AST 27  --   --   --   ALT 21  --   --   --   ALKPHOS 74  --   --   --   BILITOT 0.4  --   --   --    < > = values in this interval not displayed.    Cardiac Enzymes No results for input(s): TROPONINI in the last 168 hours.  Microbiology Results  Results for  orders placed or performed during the hospital encounter of 05/13/17  Urine culture     Status: Abnormal   Collection Time: 05/13/17  2:37 PM  Result Value Ref Range Status   Specimen Description   Final    URINE, RANDOM Performed at [facility], [address] [place], [place] [address]    Special Requests   Final    NONE Performed at [facility], [address] [place], [place] [address]    Culture MULTIPLE SPECIES PRESENT, SUGGEST RECOLLECTION (A)  Final   Report Status 05/15/2017 FINAL  Final  Wet prep, genital     Status: Abnormal   Collection Time: 05/13/17  2:37 PM  Result Value Ref Range Status   Yeast Wet Prep HPF POC NONE SEEN NONE SEEN Final   Trich, Wet Prep NONE SEEN NONE SEEN Final   Clue Cells Wet Prep HPF POC NONE SEEN NONE SEEN Final   WBC, Wet Prep HPF POC MODERATE (A) NONE SEEN Final   Sperm NONE SEEN  Final    Comment: Performed at [facility], [address] [place], [place] [address]  Culture, blood (routine x 2)     Status: None (Preliminary result)   Collection Time: 05/13/17  5:08 PM  Result Value Ref Range Status   Specimen Description BLOOD LAC  Final   Special Requests   Final    BOTTLES DRAWN AEROBIC AND ANAEROBIC Blood Culture adequate volume   Culture   Final    NO GROWTH 2 DAYS Performed at [facility], [address] [place], [place] [address]    Report Status PENDING  Incomplete  Culture, blood (routine x 2)     Status: None (Preliminary result)   Collection Time: 05/13/17  5:08 PM  Result Value Ref Range Status   Specimen Description BLOOD RAC  Final   Special Requests   Final    BOTTLES DRAWN AEROBIC AND ANAEROBIC Blood Culture adequate volume   Culture   Final    NO GROWTH 2 DAYS Performed at [facility], [address], [place], [place] [address]    Report Status PENDING  Incomplete    RADIOLOGY:  No results found.  EKG:   Orders placed or performed during the hospital  encounter of 04/01/17  . ED EKG  . ED EKG      Management plans discussed with the patient, family and they are in agreement.  CODE STATUS:     Code Status Orders  (From admission, onward)        Start     Ordered   05/14/17 0152  Full code  Continuous     05/14/17 0151    Code Status History    Date Active Date Inactive Code Status Order ID Comments User Context   04/01/2017 17:23 04/05/2017 17:42 Full Code [id number]  Daylene Posey, RN Inpatient   02/15/2017 17:15 02/17/2017 20:08 Full Code [id number]  Henreitta Leber, MD Inpatient   02/06/2017 23:56 02/09/2017 18:58 Full Code [id number]  Dustin Flock, MD Inpatient   01/02/2017 19:03 01/06/2017 18:48 Full Code [id number]  [name], MD ED   12/29/2016 12:52 12/30/2016 17:22 Full Code [id number]  Nicholes Mango, MD Inpatient   10/11/2016 23:20 10/14/2016 17:26 Full Code [id number]  Theodoro Grist, MD Inpatient   08/28/2016 11:59 08/30/2016 19:32 Full Code [id number]  Loletha Grayer, MD ED   12/18/2015 19:56 12/19/2015 19:39 Full Code [id number]  Hower, Aaron Mose, MD ED  TOTAL TIME TAKING CARE OF THIS PATIENT: 45 minutes.    Avel Peace Salary M.D on 05/15/2017 at 12:34 PM  Between 7am to 6pm - Pager - [phone]  After 6pm go to [email or web address] - password [email or web address] [facility]  Office  [phone]  CC: Primary care physician; Arnetha Courser, MD   Note: This dictation was prepared with Dragon dictation along with smaller phrase technology. Any transcriptional errors that result from this process are unintentional.

## 2017-05-15 NOTE — Clinical Social Work Note (Signed)
Clinical Social Work Assessment  Patient Details  Name: [name] MRN: [id number] Date of Birth: [birth year]  Date of referral:  05/15/17               Reason for consult:  Facility Placement                Permission sought to share information with:    Permission granted to share information::     Name::        Agency::     Relationship::     Contact Information:     Housing/Transportation Living arrangements for the past 2 months:  Single Family Home Source of Information:  Patient, Other (Comment Required)(Friend [name]) Patient Interpreter Needed:  None Criminal Activity/Legal Involvement Pertinent to Current Situation/Hospitalization:  No - Comment as needed Significant Relationships:  Friend, Spouse Lives with:  Spouse, Friends Do you feel safe going back to the place where you live?  Yes Need for family participation in patient care:  Yes (Comment)  Care giving concerns:  Patient lives in [place] with her friend/ caregiver [name] and husband [name].    Social Worker assessment / plan:  Holiday representative (CSW) received consult from PT that patient and her friend [name] would like to see CSW about placement. CSW is familiar with patient and has placed her several times at SNFs. CSW met with patient and her friend [name] was at bedside. Patient was alert and oriented X4 and was laying in the bed. CSW introduced self and explained role of CSW department. CSW explained that patient has been to rehab several times and is likely in her co-pay days. CSW also explained that [facility] requires co-pays for SNF starting day 1. Patient reported that she can't pay the co-pays and will go home and continue [facility] home health. CSW also explained long term care options and how to apply for medicaid. Per patient she does not want to go to long term care. Patient's friend [name] reported that she and patient's husband will take patient home today via car. RN case Freight forwarder and RN aware of  above. Please reconsult if future social work needs arise. CSW signing off.    Employment status:  Disabled (Comment on whether or not currently receiving Disability) Insurance information:  Managed Medicare PT Recommendations:  Home with [place] / Referral to community resources:  Other (Comment Required)(Long term care mediciad resources. )  Patient/Family's Response to care:  Patient is agreeable to go home with home health.   Patient/Family's Understanding of and Emotional Response to Diagnosis, Current Treatment, and Prognosis:  Patient was very pleasant and thanked CSW for assistance.   Emotional Assessment Appearance:  Appears stated age Attitude/Demeanor/Rapport:    Affect (typically observed):  Accepting, Adaptable, Pleasant Orientation:  Oriented to Self, Oriented to Place, Oriented to  Time, Oriented to Situation Alcohol / Substance use:  Not Applicable Psych involvement (Current and /or in the community):  No (Comment)  Discharge Needs  Concerns to be addressed:  Discharge Planning Concerns Readmission within the last 30 days:  No Current discharge risk:  Dependent with Mobility Barriers to Discharge:  No Barriers Identified   Cheryl Chay, Veronia Beets, LCSW 05/15/2017, 4:37 PM

## 2017-05-15 NOTE — NC FL2 (Signed)
[place] LEVEL OF CARE SCREENING TOOL     IDENTIFICATION  Patient Name: [name] Birthdate: 1951/01/21 Sex: female Admission Date (Current Location): 05/13/2017  [facility] and [place] Number:  Engineering geologist and Address:  [facility], [address] [place], [place] [address]      Provider Number: [id number]  Attending Physician Name and Address:  Gorden Harms, MD  Relative Name and Phone Number:       Current Level of Care: Hospital Recommended Level of Care: [place] Prior Approval Number:    Date Approved/Denied:   PASRR Number:    Discharge Plan: SNF    Current Diagnoses: Patient Active Problem List   Diagnosis Date Noted  . Cellulitis 05/14/2017  . Homicidal ideation 04/25/2017  . Cellulitis of left lower leg 04/25/2017  . Adjustment disorder with mixed disturbance of emotions and conduct 04/23/2017  . Sepsis ([place]) 02/06/2017  . Foot pain, bilateral 02/03/2017  . Tinea pedis 02/03/2017  . Lymphoma ([place]) 01/07/2017  . Pseudomonas urinary tract infection 01/02/2017  . Left ovarian cyst 12/09/2016  . Abdominal aortic atherosclerosis ([place]) 11/11/2016  . Swelling of lower extremity 10/11/2016  . Obstructive sleep apnea 10/11/2016  . Follicular lymphoma of intra-abdominal lymph nodes ([place]) 08/06/2016  . Multiple falls 06/07/2016  . Inguinal adenopathy 05/29/2016  . Obesity, morbid ([place]) 01/05/2016  . Low HDL (under 40) 12/19/2015  . Skin ulcer ([place]) 11/08/2015  . Peripheral vascular disease of lower extremity with ulceration ([place]) 11/08/2015  . CKD (chronic kidney disease) stage 3, GFR 30-59 ml/min (HCC) 11/08/2015  . Constipation due to pain medication 01/31/2015  . Obstructive sleep apnea of adult 01/13/2015  . Pelvic muscle wasting 01/13/2015  . Incomplete bladder emptying 01/13/2015  . Headache, migraine 10/24/2014  . 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 Current tobacco use 10/24/2014  .  Lumbar radiculopathy, chronic 10/02/2013  . COPD with bronchial hyperresponsiveness ([place]) 10/02/2013  . Major depressive disorder, recurrent, in partial remission ([place]) 10/02/2013  . Hypertension goal BP (blood pressure) < 140/90 10/02/2013  . Multiple sclerosis ([place]) 10/02/2013  . Absence of bladder continence 09/25/2012  . Acontractile bladder 02/12/2012  . Bladder retention 02/12/2012  . Narrowing of intervertebral disc space 11/26/2011  . Kyphoscoliosis and scoliosis 11/26/2011    Orientation RESPIRATION BLADDER Height & Weight     Self, Time, Situation, Place  Normal Continent Weight: 290 lb (131.5 kg) Height:  5\' 4"  (162.6 cm)  BEHAVIORAL SYMPTOMS/MOOD NEUROLOGICAL BOWEL NUTRITION STATUS      Continent Diet(Heart Healthy)  AMBULATORY STATUS COMMUNICATION OF NEEDS Skin   Extensive Assist Verbally Other (Comment)(Celluitis lower extremity)                       Personal Care Assistance Level of Assistance  Bathing, Feeding, Dressing Bathing Assistance: Limited assistance Feeding assistance: Independent Dressing Assistance: Limited assistance     Functional Limitations Info  Sight, Hearing, Speech Sight Info: Adequate Hearing Info: Adequate Speech Info: Adequate    SPECIAL CARE FACTORS FREQUENCY  PT (By licensed PT), OT (By licensed OT)     PT Frequency: 5(5) OT Frequency: (5)            Contractures      Additional Factors Info  Code Status, Allergies Code Status Info: (Full Code) Allergies Info: (No Known Allergies)           Current Medications (05/15/2017):  This is the current hospital active medication list Current  Facility-Administered Medications  Medication Dose Route Frequency Provider Last Rate Last Dose  . acetaminophen (TYLENOL) tablet 650 mg  650 mg Oral Q6H PRN Lance Coon, MD       Or  . acetaminophen (TYLENOL) suppository 650 mg  650 mg Rectal Q6H PRN Lance Coon, MD      . buPROPion (WELLBUTRIN XL) 24 hr tablet 300 mg  300  mg Oral Chong Sicilian, Gus Height, MD   300 mg at 05/15/17 0917  . clindamycin (CLEOCIN) IVPB 600 mg  600 mg Intravenous Q8H Fritzi Mandes, MD   Stopped at 05/15/17 1502  . clonazePAM (KLONOPIN) tablet 1 mg  1 mg Oral QHS Fritzi Mandes, MD   1 mg at 05/14/17 2137  . DULoxetine (CYMBALTA) DR capsule 60 mg  60 mg Oral Chong Sicilian, Gus Height, MD   60 mg at 05/15/17 0918  . enoxaparin (LOVENOX) injection 40 mg  40 mg Subcutaneous Q24H Lance Coon, MD   40 mg at 05/15/17 0253  . gabapentin (NEURONTIN) tablet 600 mg  600 mg Oral TID Fritzi Mandes, MD   600 mg at 05/15/17 0918  . hydrochlorothiazide (HYDRODIURIL) tablet 25 mg  25 mg Oral Daily Salary, Holly Bodily D, MD   25 mg at 05/15/17 0917  . [START ON 05/19/2017] interferon beta-1a (AVONEX) injection 30 mcg  30 mcg Intramuscular Q Crista Curb, [place], MD      . methocarbamol (ROBAXIN) tablet 500 mg  500 mg Oral Q8H PRN Fritzi Mandes, MD      . methylphenidate (RITALIN) tablet 10 mg  10 mg Oral BID Fritzi Mandes, MD   10 mg at 05/15/17 1427  . multivitamin with minerals tablet 1 tablet  1 tablet Oral QPM Fritzi Mandes, MD   1 tablet at 05/14/17 1657  . ondansetron (ZOFRAN) tablet 4 mg  4 mg Oral Q6H PRN Lance Coon, MD       Or  . ondansetron [facility]) injection 4 mg  4 mg Intravenous Q6H PRN Lance Coon, MD      . oxyCODONE (Oxy IR/ROXICODONE) immediate release tablet 5 mg  5 mg Oral Q4H PRN Lance Coon, MD   5 mg at 05/14/17 1700  . polyethylene glycol (MIRALAX / GLYCOLAX) packet 17 g  17 g Oral Daily PRN Fritzi Mandes, MD      . potassium chloride (KLOR-CON) packet 40 mEq  40 mEq Oral Once Salary, Holly Bodily D, MD      . QUEtiapine (SEROQUEL XR) 24 hr tablet 150 mg  150 mg Oral QHS Fritzi Mandes, MD   150 mg at 05/14/17 2137  . topiramate (TOPAMAX) tablet 50 mg  50 mg Oral Daily Fritzi Mandes, MD   50 mg at 05/15/17 0917  . vitamin C (ASCORBIC ACID) tablet 500 mg  500 mg Oral QPM Fritzi Mandes, MD   500 mg at 05/14/17 1657   Facility-Administered Medications Ordered in Other  Encounters  Medication Dose Route Frequency Provider Last Rate Last Dose  . heparin lock flush 100 unit/mL  500 Units Intracatheter Once PRN Sindy Guadeloupe, MD         Discharge Medications: Please see discharge summary for a list of discharge medications.  Relevant Imaging Results:  Relevant Lab Results:   Additional Information (SSN: [id number])  Smith Mince, Student-Social Work

## 2017-05-16 ENCOUNTER — Other Ambulatory Visit: Payer: Self-pay | Admitting: *Deleted

## 2017-05-16 ENCOUNTER — Telehealth: Payer: Self-pay | Admitting: Family Medicine

## 2017-05-16 NOTE — Telephone Encounter (Signed)
Copied from [place] [id number]. Topic: Quick Communication - See Telephone Encounter >> May 16, 2017  8:46 AM Robina Ade, Leone Payor wrote: Sela Hua with [facility] called and said that he did not see patient yesterday the 60 th because it looked like patient was in the hospital. If you have any questions he can be reached at [phone]. CRM for notification. See Telephone encounter for: 05/16/17.

## 2017-05-16 NOTE — Patient Outreach (Signed)
05/16/17   [name] Jul 21, 1950 [id number]  Successful telephone encounter to [name], 67 year old female for follow up on recent observation stay January 15-17,2019 for  Cellulitis of left lower extremity.  Pt's history includes but not limited To HTN, Depression, MS, COPD, PVD, Lymphoma.   Spoke with  Pt, HIPAA identifiers verified.   Pt reports feeling okay, Left lower  Extremity doing better,not red or warm.  Pt reports  HH PT put  Support stockings on yesterday,she  took off today, plan is  to keep on during the day/off at night.  Pt reports has not started antibiotic  Yet, husband to pick up from pharmacy, no other medication changes.    Plan:  As discussed with pt, plan to follow up again next week telephonically.     Zara Chess.   [facility]  [phone]

## 2017-05-18 LAB — CULTURE, BLOOD (ROUTINE X 2)
Culture: NO GROWTH
Culture: NO GROWTH
Special Requests: ADEQUATE
Special Requests: ADEQUATE

## 2017-05-21 ENCOUNTER — Ambulatory Visit
Admission: RE | Admit: 2017-05-21 | Discharge: 2017-05-21 | Disposition: A | Discharge status: Home / Self Care | Payer: PPO | Source: Ambulatory Visit | Attending: Oncology | Admitting: Oncology

## 2017-05-21 DIAGNOSIS — R918 Other nonspecific abnormal finding of lung field: Secondary | ICD-10-CM | POA: Diagnosis not present

## 2017-05-21 DIAGNOSIS — C8293 Follicular lymphoma, unspecified, intra-abdominal lymph nodes: Secondary | ICD-10-CM | POA: Insufficient documentation

## 2017-05-21 DIAGNOSIS — I700 Atherosclerosis of aorta: Secondary | ICD-10-CM | POA: Insufficient documentation

## 2017-05-21 DIAGNOSIS — N83202 Unspecified ovarian cyst, left side: Secondary | ICD-10-CM | POA: Diagnosis not present

## 2017-05-22 ENCOUNTER — Inpatient Hospital Stay: Payer: Medicare Other | Admitting: Family Medicine

## 2017-05-23 ENCOUNTER — Other Ambulatory Visit: Payer: Self-pay | Admitting: *Deleted

## 2017-05-23 ENCOUNTER — Telehealth: Payer: Self-pay | Admitting: Family Medicine

## 2017-05-23 MED ORDER — LISINOPRIL 10 MG PO TABS: 10.0000 mg | ORAL_TABLET | Freq: Every day | ORAL | 0 refills | Status: DC

## 2017-05-23 NOTE — Patient Outreach (Addendum)
05/23/17  [name]  23-Oct-1950 [id number]  Successful telephone encounter to [name], 67 year old female for follow up on current clinical status, pt  Had a recent observation stay January 15-17,2019 for cellulitis of left lower extremity.   Spoke with pt, HIPAA  Identifiers verified.  Pt reports on left lower extremity, has some swelling, a little redness, no warmth or Pain, need to elevate.  Pt reports taking all of her medications, completed antibiotic.  Pt reports does not  Wear her support stockings all the time, aware needs to.   Pt reports BP has improved, most recent reading 120/86.   Pt discussed plan to follow up with a new PCP- view in EMR appointment desk on 07/01/17.   Plan: As discussed with pt, plan to follow up again next month telephonically- check on status.     [name].   [facility]  [phone]

## 2017-05-23 NOTE — Telephone Encounter (Signed)
Copied from [place] [id number]. Topic: Quick Communication - See Telephone Encounter >> May 23, 2017  3:53 PM Vernona Rieger wrote: CRM for notification. See Telephone encounter for:   05/23/17.  Averie from [facility] called and said she has had some elevated BP readings today in the right arm @3 :45pm 200/104, left arm 3:50pm 185/98. He said that he was going to stay with her for a little while. Offered PEC triage & he said no.  Call back [phone]

## 2017-05-23 NOTE — Telephone Encounter (Signed)
I called home health nurse She was eating [place] She eats poorly BP was elevated the other day, but came down 181/91 by the time he left today Asymptomatic Knows s/s of heart attack and stroke Taking hctz and K+ Not on ACE-I any more He told her he felt sure I would start something and to expect call from [facility] when Rx is ready; I will send ACE-I She is not fatigued I gave him order for BMP and BP check on Monday, to ER over weekend She does not have hospital f/u with me scheduled; she needs hospital f/u appt

## 2017-05-26 ENCOUNTER — Telehealth: Payer: Self-pay | Admitting: Family Medicine

## 2017-05-26 ENCOUNTER — Other Ambulatory Visit
Admission: RE | Admit: 2017-05-26 | Discharge: 2017-05-26 | Disposition: A | Discharge status: Home / Self Care | Payer: PPO | Source: Ambulatory Visit | Attending: Family Medicine | Admitting: Family Medicine

## 2017-05-26 DIAGNOSIS — Z5181 Encounter for therapeutic drug level monitoring: Secondary | ICD-10-CM | POA: Insufficient documentation

## 2017-05-26 DIAGNOSIS — N183 Chronic kidney disease, stage 3 (moderate): Secondary | ICD-10-CM | POA: Diagnosis not present

## 2017-05-26 LAB — BASIC METABOLIC PANEL
Anion gap: 11 (ref 5–15)
BUN: 17 mg/dL (ref 6–20)
CO2: 25 mmol/L (ref 22–32)
Calcium: 9.3 mg/dL (ref 8.9–10.3)
Chloride: 100 mmol/L — ABNORMAL LOW (ref 101–111)
Creatinine, Ser: 1.12 mg/dL — ABNORMAL HIGH (ref 0.44–1.00)
GFR calc Af Amer: 58 mL/min — ABNORMAL LOW (ref >60)
GFR calc non Af Amer: 50 mL/min — ABNORMAL LOW (ref >60)
Glucose, Bld: 78 mg/dL (ref 65–99)
Potassium: 3.8 mmol/L (ref 3.5–5.1)
Sodium: 136 mmol/L (ref 135–145)

## 2017-05-26 NOTE — Telephone Encounter (Signed)
LVM for pt to call and schedule a Hosp fu

## 2017-05-26 NOTE — Telephone Encounter (Signed)
Copied from [place] #[id number]. Topic: General - Other >> May 26, 2017  4:56 PM Yvette Rack wrote: Reason for CRM: Avary from [place] [phone] calling stating that Dr Sanda Klein wanted him to call her to give her pt blood pressure readings  Patient did get blood work drawn BMP today per [place]  Patient BP readings are 145/81 at rest 201/110 walking atleast 10 feet Final reading 151/88

## 2017-05-27 ENCOUNTER — Encounter: Payer: Self-pay | Admitting: Family Medicine

## 2017-05-27 ENCOUNTER — Telehealth: Payer: Self-pay | Admitting: Family Medicine

## 2017-05-27 NOTE — Telephone Encounter (Signed)
Please let patient know that her kidney function and potassium are much better than the last check Please ask her to have a hospital f/u appt; she was in the hospital 2 weeks ago and I don't see any upcoming appointments Thank you

## 2017-05-27 NOTE — Telephone Encounter (Signed)
She sounds very deconditioned Please recommended PT, diagnosis deconditioning, weakness Creatinine and potassium were fine on the BP medicine Recheck BP in one week, we'll give it a little more time to bring her pressures down before adjusting dose Thank you

## 2017-05-27 NOTE — Telephone Encounter (Signed)
Naida Sleight notified, and states she is already doing PT

## 2017-05-27 NOTE — Telephone Encounter (Signed)
Notified, appt scheduled for 2/5

## 2017-05-29 ENCOUNTER — Telehealth: Payer: Self-pay | Admitting: Family Medicine

## 2017-05-29 NOTE — Telephone Encounter (Signed)
Copied from [place]. Topic: General - Other >> May 29, 2017  8:27 AM Yvette Rack wrote: Reason for CRM: Erskine Emery from [facility] [phone] is calling for verbal orders for Occupational Therapy  2 times a week for 4 weeks

## 2017-05-29 NOTE — Telephone Encounter (Signed)
Ok to give verbal 

## 2017-05-29 NOTE — Telephone Encounter (Signed)
Yes, thank you.

## 2017-05-29 NOTE — Telephone Encounter (Signed)
Called Steffanie, no answer LM for her giving verbal ok for orders. To call back for questions or concerns.

## 2017-06-03 ENCOUNTER — Inpatient Hospital Stay: Payer: Self-pay | Admitting: Family Medicine

## 2017-06-04 NOTE — Telephone Encounter (Signed)
Visit complete.

## 2017-06-05 ENCOUNTER — Telehealth: Payer: Self-pay

## 2017-06-05 DIAGNOSIS — Z5181 Encounter for therapeutic drug level monitoring: Secondary | ICD-10-CM | POA: Insufficient documentation

## 2017-06-05 MED ORDER — HYDROCHLOROTHIAZIDE 25 MG PO TABS: 12.5000 mg | ORAL_TABLET | Freq: Every day | ORAL | 0 refills | Status: DC

## 2017-06-05 NOTE — Telephone Encounter (Signed)
Home health and patient notified, pt scheduled an appt for next tuesday  Copied from [place]. Topic: General - Other >> Jun 04, 2017  2:20 PM Oneta Rack wrote: Osvaldo Human name: [name]  Relation to pt: OT from [facility]  Call back number: [phone]  Pharmacy: [facility], [place] [phone] (Phone) [phone] (Fax)  Reason for call:  OT wanted to inform PCP patient BP elevated  177/95 left arm sitting no activity for 20 minutes. Requesting  hydrochlorothiazide (HYDRODIURIL) 25 MG tablet, patient completely out, please advise >> Jun 04, 2017  2:25 PM Oneta Rack wrote: Osvaldo Human name: [name]  Relation to pt: OT from [facility]  Call back number: [phone]  Pharmacy: [facility], [place] [phone] (Phone) [phone] (Fax)  Reason for call:  OT wanted to inform PCP patient BP elevated  177/95 left arm sitting no activity for 20 minutes. Requesting  hydrochlorothiazide (HYDRODIURIL) 25 MG tablet, patient completely out, please advise

## 2017-06-05 NOTE — Telephone Encounter (Signed)
Please let them know that the patient no showed once again this week I have to see her if I'm going to take care of her Ask her to reschedule so we can address her BP

## 2017-06-05 NOTE — Assessment & Plan Note (Signed)
Check renal function. 

## 2017-06-10 ENCOUNTER — Ambulatory Visit: Payer: Self-pay | Admitting: Family Medicine

## 2017-06-11 ENCOUNTER — Ambulatory Visit: Payer: Self-pay | Admitting: *Deleted

## 2017-06-11 NOTE — Telephone Encounter (Signed)
[name], RN caregiver with [facility]  for Ms [name] is requesting an antibiotic ointment for this patient for swelling and redness in her legs. She states she thinks it may be cellulitis. Does not want to go to ED or urgent care.  Pt is not running a fever and states the swelling has just started. No drainage noted. Appointment made for Friday, first available appointment.  Reason for Disposition . [1] MODERATE leg swelling (e.g., swelling extends up to knees) AND [2] new onset or worsening  Answer Assessment - Initial Assessment Questions 1. ONSET: "When did the swelling start?" (e.g., minutes, hours, days)     Yesterday  2. LOCATION: "What part of the leg is swollen?"  "Are both legs swollen or just one leg?"     Both legs, from the knee down ankle more pronounced 3. SEVERITY: "How bad is the swelling?" (e.g., localized; mild, moderate, severe)  - Localized - small area of swelling localized to one leg  - MILD pedal edema - swelling limited to foot and ankle, pitting edema < 1/4 inch (6 mm) deep, rest and elevation eliminate most or all swelling  - MODERATE edema - swelling of lower leg to knee, pitting edema > 1/4 inch (6 mm) deep, rest and elevation only partially reduce swelling  - SEVERE edema - swelling extends above knee, facial or hand swelling present      Non pitting, localized reddness 4. REDNESS: "Does the swelling look red or infected?"     Red and infected 5. PAIN: "Is the swelling painful to touch?" If so, ask: "How painful is it?"   (Scale 1-10; mild, moderate or severe)     She has MS, does not have feeling 6. FEVER: "Do you have a fever?" If so, ask: "What is it, how was it measured, and when did it start?"      no 7. CAUSE: "What do you think is causing the leg swelling?"     cellulitis 8. MEDICAL HISTORY: "Do you have a history of heart failure, kidney disease, liver failure, or cancer?"     Kidney disease, foley catheter 9. RECURRENT SYMPTOM: "Have you had leg  swelling before?" If so, ask: "When was the last time?" "What happened that time?"     Yes has happened before, usually every 3 months 10. OTHER SYMPTOMS: "Do you have any other symptoms?" (e.g., chest pain, difficulty breathing)       no 11. PREGNANCY: "Is there any chance you are pregnant?" "When was your last menstrual period?"       No  Protocols used: LEG SWELLING AND EDEMA-A-AH

## 2017-06-12 ENCOUNTER — Telehealth: Payer: Self-pay | Admitting: Family Medicine

## 2017-06-12 MED ORDER — HYDRALAZINE HCL 25 MG PO TABS: 25.0000 mg | ORAL_TABLET | Freq: Three times a day (TID) | ORAL | 5 refills | Status: DC

## 2017-06-12 NOTE — Telephone Encounter (Signed)
Start hydralazine 25 mg every eight hours (three times a day, as close to every eight hours as she can get, such as 7 am, 3 pm, and 11 pm)

## 2017-06-12 NOTE — Telephone Encounter (Signed)
Copied from [place] [id number]. Topic: Inquiry >> Jun 12, 2017  2:03 PM Conception Chancy, NT wrote: Naida Sleight, a OT with [facility] is calling and states patient has had elevated blood pressure, today the readings were at 1:00pm 190/98 and 2:00pm 187/100. States it has been this way all week it is not a isolated thing.   Naida Sleight [phone]

## 2017-06-13 ENCOUNTER — Encounter: Payer: Self-pay | Admitting: Family Medicine

## 2017-06-13 ENCOUNTER — Ambulatory Visit (INDEPENDENT_AMBULATORY_CARE_PROVIDER_SITE_OTHER): Payer: Self-pay | Admitting: Family Medicine

## 2017-06-13 VITALS — BP 152/74 | HR 76 | Temp 98.2°F | Resp 16 | Wt 307.8 lb

## 2017-06-13 DIAGNOSIS — F3341 Major depressive disorder, recurrent, in partial remission: Secondary | ICD-10-CM

## 2017-06-13 DIAGNOSIS — I10 Essential (primary) hypertension: Secondary | ICD-10-CM

## 2017-06-13 DIAGNOSIS — Z630 Problems in relationship with spouse or partner: Secondary | ICD-10-CM

## 2017-06-13 DIAGNOSIS — R600 Localized edema: Secondary | ICD-10-CM

## 2017-06-13 NOTE — Telephone Encounter (Signed)
When I called to contact pt about BP, she was already on her way  here to see Dr Ancil Boozer for an evaluation because she was concerned and still wants to be seen.

## 2017-06-13 NOTE — Progress Notes (Signed)
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   Nitrite NEGATIVE NEGATIVE   Leukocytes, UA MODERATE (A) NEGATIVE   RBC / HPF 0-5 0 - 5 RBC/hpf   WBC, UA 6-30 0 - 5 WBC/hpf   Bacteria, UA NONE SEEN NONE SEEN   Squamous Epithelial / LPF 0-5 (A) NONE SEEN    Comment: Performed at [facility], [address] [place], [place] [address]  Urine culture     Status: Abnormal   Collection Time: 05/13/17  2:37 PM  Result Value Ref Range   Specimen Description      URINE, RANDOM Performed at [facility], [address], [place], [place] [address]    Special Requests      NONE Performed at [facility], [address] [place], [place] [address]    Culture MULTIPLE SPECIES PRESENT, SUGGEST RECOLLECTION (A)    Report Status  05/15/2017 FINAL   Wet prep, genital     Status: Abnormal   Collection Time: 05/13/17  2:37 PM  Result Value Ref Range   Yeast Wet Prep HPF POC NONE SEEN NONE SEEN   Trich, Wet Prep NONE SEEN NONE SEEN   Clue Cells Wet Prep HPF POC NONE SEEN NONE SEEN   WBC, Wet Prep HPF POC MODERATE (A) NONE SEEN   Sperm NONE SEEN     Comment: Performed at [facility], [address] [place], [place] [address]  Culture, blood (routine x 2)     Status: None   Collection Time: 05/13/17  5:08 PM  Result Value Ref Range   Specimen Description BLOOD LAC    Special Requests      BOTTLES DRAWN AEROBIC AND ANAEROBIC Blood Culture adequate volume   Culture      NO GROWTH 5 DAYS Performed at [facility], [place],  [place] [address]    Report Status 05/18/2017 FINAL   Culture, blood (routine x 2)     Status: None   Collection Time: 05/13/17  5:08 PM  Result Value Ref Range   Specimen Description BLOOD RAC    Special Requests      BOTTLES DRAWN AEROBIC AND ANAEROBIC Blood Culture adequate volume   Culture      NO GROWTH 5 DAYS Performed at [facility], [address] [place], [place] [address]    Report Status 05/18/2017 FINAL   Basic metabolic panel     Status: Abnormal   Collection Time: 05/14/17  4:25 AM  Result Value Ref Range   Sodium 137 135 - 145 mmol/L   Potassium 3.0 (L) 3.5 - 5.1 mmol/L   Chloride 101 101 - 111 mmol/L   CO2 24 22 - 32 mmol/L   Glucose, Bld 135 (H) 65 - 99 mg/dL   BUN 16 6 - 20 mg/dL   Creatinine, Ser 1.22 (H) 0.44 - 1.00 mg/dL   Calcium 9.0 8.9 - 10.3 mg/dL   GFR calc non Af Amer 45 (L) >60 mL/min   GFR calc Af Amer 52 (L) >60 mL/min    Comment: (NOTE) The eGFR has been calculated using the CKD EPI equation. This calculation has not been validated in all clinical situations. eGFR's persistently <60 mL/min signify possible Chronic Kidney Disease.    Anion gap 12 5 - 15    Comment: Performed at [facility],  [address] [place], [place] [address]  CBC     Status: Abnormal   Collection Time: 05/14/17  4:25 AM  Result Value Ref Range   WBC 7.4 3.6 - 11.0 K/uL   RBC 4.17 3.80 - 5.20 MIL/uL   Hemoglobin 12.4 12.0 - 16.0 g/dL   HCT 37.1 35.0 - 47.0 %   MCV 89.0 80.0 - 100.0 fL   MCH 29.6 26.0 - 34.0 pg   MCHC 33.3 32.0 - 36.0 g/dL   RDW 16.0 (H) 11.5 - 14.5 %   Platelets 271 150 - 440 K/uL    Comment: Performed at [facility], [address] [place], [place] [address]  Basic metabolic panel     Status: Abnormal   Collection Time: 05/15/17  6:40 AM  Result Value Ref Range   Sodium 136 135 - 145 mmol/L   Potassium 2.8 (L) 3.5 - 5.1 mmol/L   Chloride 99 (L) 101 - 111 mmol/L   CO2 28 22 - 32 mmol/L   Glucose, Bld 129 (H) 65 - 99 mg/dL   BUN 14 6 - 20 mg/dL   Creatinine, Ser 1.16 (H) 0.44 - 1.00 mg/dL   Calcium 8.8 (L) 8.9 - 10.3 mg/dL   GFR calc non Af Amer 48 (L) >60 mL/min   GFR calc Af Amer 56 (L) >60 mL/min    Comment: (NOTE) The eGFR has been calculated using the CKD EPI equation. This calculation has not been validated in all clinical situations. eGFR's persistently <60 mL/min signify possible Chronic Kidney Disease.    Anion gap 9 5 - 15    Comment: Performed at [facility], [address] [place], [place] [address]  Magnesium     Status: None   Collection Time: 05/15/17  9:31 AM  Result Value Ref Range   Magnesium 1.7 1.7 - 2.4 mg/dL    Comment: Performed at [facility], [address] [place], [place] [address]  Basic metabolic panel  Status: Abnormal   Collection Time: 05/26/17  2:15 PM  Result Value Ref Range   Sodium 136 135 - 145 mmol/L   Potassium 3.8 3.5 - 5.1 mmol/L   Chloride 100 (L) 101 - 111 mmol/L   CO2 25 22 - 32 mmol/L   Glucose, Bld 78 65 - 99 mg/dL   BUN 17 6 - 20 mg/dL   Creatinine, Ser 1.12 (H) 0.44 - 1.00 mg/dL   Calcium 9.3 8.9 - 10.3 mg/dL   GFR calc non Af Amer 50 (L) >60 mL/min   GFR calc Af Amer 58 (L)  >60 mL/min    Comment: (NOTE) The eGFR has been calculated using the CKD EPI equation. This calculation has not been validated in all clinical situations. eGFR's persistently <60 mL/min signify possible Chronic Kidney Disease.    Anion gap 11 5 - 15    Comment: Performed at [facility], [address] [place], [place] [address]     PHQ2/9: Depression screen [facility] 2/9 04/23/2017 03/03/2017 02/04/2017 02/03/2017 11/11/2016  Decreased Interest '1 1 1 1 1  ' Down, Depressed, Hopeless '3 1 1 3 3  ' PHQ - 2 Score '4 2 2 4 4  ' Altered sleeping 1 0 0 1 -  Tired, decreased energy '3 2 3 3 3  ' Change in appetite 0 1 - 1 1  Feeling bad or failure about yourself  3 0 0 - 1  Trouble concentrating - 0 0 1 1  Moving slowly or fidgety/restless 2 0 1 0 1  Suicidal thoughts 0 0 0 0 0  PHQ-9 Score '13 5 6 10 ' -  Difficult doing work/chores - Somewhat difficult Not difficult at all Somewhat difficult Somewhat difficult  Some recent data might be hidden     Fall Risk: Fall Risk  04/23/2017 03/25/2017 02/10/2017 02/04/2017 02/03/2017  Falls in the past year? No (No Data) (No Data) Yes Yes  Comment - Patient denies new falls today, since [facility] RN CM outreach last week where she reported a fall then Patient denies new falls since hospital discharge 02/09/17 - -  Number falls in past yr: - - - 2 or more 2 or more  Injury with Fall? - - - No No  Risk Factor Category  - - - - -  Risk for fall due to : - - - - -  Follow up - - - Falls prevention discussed -     Functional Status Survey: Is the patient deaf or have difficulty hearing?: No Does the patient have difficulty seeing, even when wearing glasses/contacts?: No Does the patient have difficulty concentrating, remembering, or making decisions?: Yes Does the patient have difficulty walking or climbing stairs?: Yes Does the patient have difficulty dressing or bathing?: Yes Does the patient have difficulty doing errands alone such as visiting a doctor's  office or shopping?: Yes    Assessment & Plan  1. Uncontrolled hypertension  Needs to get hydralazine filled   2. Marital problems  Discussed women's shelter  3. Major depressive disorder, recurrent, in partial remission (HCC)  Seeing Dr. Kasandra Knudsen, but not getting therapy, explained that her depression can cause the bp to raise.   4. Bilateral lower extremity edema  Discussed resuming compression stocking hoses and elevate legs during the day

## 2017-06-14 NOTE — Progress Notes (Signed)
Signing note, already wet signed

## 2017-06-14 NOTE — Telephone Encounter (Signed)
I discovered a phone note that has been left open for more than two months Patient has been seen by my colleague (just yesterday) I am closing this phone note

## 2017-06-14 NOTE — Telephone Encounter (Signed)
Patient was seen by my colleague in the office yesterda I am closing the phone note

## 2017-06-18 ENCOUNTER — Encounter: Payer: Self-pay | Admitting: *Deleted

## 2017-06-18 ENCOUNTER — Other Ambulatory Visit: Payer: Self-pay | Admitting: *Deleted

## 2017-06-18 NOTE — Patient Outreach (Signed)
06/08/2017   Successful telephone encounter to [name], 67 year old female- check on clinical status.   Observation stay  January 15-17,2019 for Cellulitis of left lower extremity.  Pt's history includes but not limited to Hypertension,  MS, COPD, PVD,Lymphoma.  Spoke with pt, HIPAA identifiers verified.   Pt reports doing okay  to which RN CM  Discussed view in EMR recent visit with PCP for pain/redness in bilateral feet/ankles.  Pt reports no  Antibiotic was given,told to wear compression stockings which she has not been doing, too hard to put on.  Discussed with pt having friend [name] (pt confirmed still staying with her), apply her  stockings in the morning.    Pt reports elevates her legs as much as she can.  Pt reports did pick up Hydralazine  from  Pharmacy, started taking it 2 days ago (confirmed taking 25 mg  three times a day), [name] checked her  BP today- result 124/78, Oxygen 98%.  Pt also reports taking all of her other medications.   RN CM discussed with pt plan to [facility], no further case management needs to which pt agreed.   Plan: As discussed with pt, plan to discharge from [facility]- close case.           Plan to inform Dr. Sanda Klein of discharge- send case closure letter.     Zara Chess.   [place] [facility]  [phone]

## 2017-06-19 ENCOUNTER — Telehealth: Payer: Self-pay | Admitting: Family Medicine

## 2017-06-19 NOTE — Telephone Encounter (Signed)
OT is seeing patient She fell last night, body locked up Has MS Just wants labs: okay for UA, CBC w/diff, CMP Orders given verbally to [facility], he will have nurse draw

## 2017-06-20 ENCOUNTER — Other Ambulatory Visit: Payer: Self-pay | Admitting: Family Medicine

## 2017-06-20 ENCOUNTER — Telehealth: Payer: Self-pay | Admitting: Family Medicine

## 2017-06-20 ENCOUNTER — Other Ambulatory Visit: Payer: Self-pay

## 2017-06-20 ENCOUNTER — Other Ambulatory Visit
Admission: RE | Admit: 2017-06-20 | Discharge: 2017-06-20 | Disposition: A | Discharge status: Home / Self Care | Payer: PPO | Source: Ambulatory Visit | Attending: Family Medicine | Admitting: Family Medicine

## 2017-06-20 DIAGNOSIS — R4182 Altered mental status, unspecified: Secondary | ICD-10-CM | POA: Diagnosis not present

## 2017-06-20 LAB — COMPREHENSIVE METABOLIC PANEL
ALT: 18 U/L (ref 14–54)
AST: 22 U/L (ref 15–41)
Albumin: 3.8 g/dL (ref 3.5–5.0)
Alkaline Phosphatase: 66 U/L (ref 38–126)
Anion gap: 10 (ref 5–15)
BUN: 16 mg/dL (ref 6–20)
CO2: 23 mmol/L (ref 22–32)
Calcium: 8.6 mg/dL — ABNORMAL LOW (ref 8.9–10.3)
Chloride: 98 mmol/L — ABNORMAL LOW (ref 101–111)
Creatinine, Ser: 1.27 mg/dL — ABNORMAL HIGH (ref 0.44–1.00)
GFR calc Af Amer: 50 mL/min — ABNORMAL LOW (ref >60)
GFR calc non Af Amer: 43 mL/min — ABNORMAL LOW (ref >60)
Glucose, Bld: 112 mg/dL — ABNORMAL HIGH (ref 65–99)
Potassium: 3.9 mmol/L (ref 3.5–5.1)
Sodium: 131 mmol/L — ABNORMAL LOW (ref 135–145)
Total Bilirubin: 0.5 mg/dL (ref 0.3–1.2)
Total Protein: 6.4 g/dL — ABNORMAL LOW (ref 6.5–8.1)

## 2017-06-20 LAB — URINALYSIS, ROUTINE W REFLEX MICROSCOPIC
Bilirubin Urine: NEGATIVE
Glucose, UA: NEGATIVE mg/dL
Ketones, ur: NEGATIVE mg/dL
Nitrite: POSITIVE — AB
Protein, ur: 30 mg/dL — AB
Specific Gravity, Urine: 1.011 (ref 1.005–1.030)
pH: 7 (ref 5.0–8.0)

## 2017-06-20 LAB — CBC WITH DIFFERENTIAL/PLATELET
Basophils Absolute: 0.1 10*3/uL (ref 0–0.1)
Basophils Relative: 1 %
Eosinophils Absolute: 0.2 10*3/uL (ref 0–0.7)
Eosinophils Relative: 3 %
HCT: 34.2 % — ABNORMAL LOW (ref 35.0–47.0)
Hemoglobin: 11.5 g/dL — ABNORMAL LOW (ref 12.0–16.0)
Lymphocytes Relative: 3 %
Lymphs Abs: 0.2 10*3/uL — ABNORMAL LOW (ref 1.0–3.6)
MCH: 30 pg (ref 26.0–34.0)
MCHC: 33.7 g/dL (ref 32.0–36.0)
MCV: 89.3 fL (ref 80.0–100.0)
Monocytes Absolute: 0.6 10*3/uL (ref 0.2–0.9)
Monocytes Relative: 10 %
Neutro Abs: 5.3 10*3/uL (ref 1.4–6.5)
Neutrophils Relative %: 83 %
Platelets: 247 10*3/uL (ref 150–440)
RBC: 3.83 MIL/uL (ref 3.80–5.20)
RDW: 15.3 % — ABNORMAL HIGH (ref 11.5–14.5)
WBC: 6.4 10*3/uL (ref 3.6–11.0)

## 2017-06-20 MED ORDER — CIPROFLOXACIN HCL 250 MG PO TABS: 250.0000 mg | ORAL_TABLET | Freq: Two times a day (BID) | ORAL | 0 refills | Status: DC

## 2017-06-20 MED ORDER — NITROFURANTOIN MONOHYD MACRO 100 MG PO CAPS: 100.0000 mg | ORAL_CAPSULE | Freq: Two times a day (BID) | ORAL | 0 refills | Status: DC

## 2017-06-20 NOTE — Progress Notes (Signed)
Rx sent; I called patient I sent macrobid, then called to cancel that after seeing her GFR; msg left for pharmacy

## 2017-06-20 NOTE — Telephone Encounter (Signed)
Labs ordered and signature placed and faxed to [facility] lab

## 2017-06-20 NOTE — Telephone Encounter (Signed)
Copied from [place] [id number]. Topic: Quick Communication - See Telephone Encounter >> Jun 20, 2017  8:26 AM Burnis Medin, NT wrote: CRM for notification. See Telephone encounter for: [name] is calling and said she dropped off blood and urine samples for test but [facility] lab said they couldn't process it without  a signature. She asked if the doctor could fax signature to [phone].  06/20/17.

## 2017-06-25 ENCOUNTER — Telehealth: Payer: Self-pay

## 2017-06-25 DIAGNOSIS — L03119 Cellulitis of unspecified part of limb: Secondary | ICD-10-CM | POA: Insufficient documentation

## 2017-06-25 NOTE — Assessment & Plan Note (Signed)
Refer to ID

## 2017-06-25 NOTE — Telephone Encounter (Signed)
Lana from [facility] called requesting verbal orders for a [name] from Dr. Sanda Klein regarding [name] left leg condition. Lana, RN states for the past week she have been observing patient left leg progressively getting worst with symptoms of elevated blood pressure of 165/87. As well as patient complaining of being lethargic along with having open wounds on her left leg that are causing redness and swelling. Lana, RN states these symptoms are constant with Cellulitis and would like to request Dr. Sanda Klein to call her and go over this with her. [name], RN from [facility] phone number is [phone].

## 2017-06-25 NOTE — Telephone Encounter (Signed)
I returned called to [place]; left message I spoke with patient She says she does not feel bad enough to go to the hospital She has a place on her left lower leg She has not seen infectious disease No problems with urine Putting triple antibiotic ointment and mupirocin Unna boot has helped in the past and pt agrees that would help High BP, no lows Increase lisinopril from 10 to 20 mg Keep close eye on it; she agrees and affirms again that she did not want to go to the hospital Verbal given to Lana for unna boot via voicemail, as well as instructions to increase lisinopril from 10 mg to 20 mg daily and go out to recheck her BP and draw BMP in one week

## 2017-06-27 ENCOUNTER — Other Ambulatory Visit: Payer: Self-pay | Admitting: Family Medicine

## 2017-06-27 MED ORDER — HYDRALAZINE HCL 25 MG PO TABS: 50.0000 mg | ORAL_TABLET | Freq: Three times a day (TID) | ORAL | 1 refills | Status: DC

## 2017-06-27 NOTE — Progress Notes (Signed)
Have patient increase her hydralazine from 25 mg TID to 50 mg TID Monitor BP and call on Monday

## 2017-07-01 ENCOUNTER — Telehealth: Payer: Self-pay

## 2017-07-01 ENCOUNTER — Ambulatory Visit: Payer: PPO | Admitting: Internal Medicine

## 2017-07-01 DIAGNOSIS — N183 Chronic kidney disease, stage 3 (moderate): Secondary | ICD-10-CM | POA: Diagnosis not present

## 2017-07-01 DIAGNOSIS — L97821 Non-pressure chronic ulcer of other part of left lower leg limited to breakdown of skin: Secondary | ICD-10-CM | POA: Diagnosis not present

## 2017-07-01 DIAGNOSIS — Z9181 History of falling: Secondary | ICD-10-CM | POA: Diagnosis not present

## 2017-07-01 DIAGNOSIS — Z87891 Personal history of nicotine dependence: Secondary | ICD-10-CM | POA: Diagnosis not present

## 2017-07-01 DIAGNOSIS — C8293 Follicular lymphoma, unspecified, intra-abdominal lymph nodes: Secondary | ICD-10-CM | POA: Diagnosis not present

## 2017-07-01 DIAGNOSIS — G40909 Epilepsy, unspecified, not intractable, without status epilepticus: Secondary | ICD-10-CM | POA: Diagnosis not present

## 2017-07-01 DIAGNOSIS — M419 Scoliosis, unspecified: Secondary | ICD-10-CM | POA: Diagnosis not present

## 2017-07-01 DIAGNOSIS — G629 Polyneuropathy, unspecified: Secondary | ICD-10-CM | POA: Diagnosis not present

## 2017-07-01 DIAGNOSIS — N312 Flaccid neuropathic bladder, not elsewhere classified: Secondary | ICD-10-CM | POA: Diagnosis not present

## 2017-07-01 DIAGNOSIS — F3341 Major depressive disorder, recurrent, in partial remission: Secondary | ICD-10-CM | POA: Diagnosis not present

## 2017-07-01 DIAGNOSIS — R600 Localized edema: Secondary | ICD-10-CM | POA: Diagnosis not present

## 2017-07-01 DIAGNOSIS — I129 Hypertensive chronic kidney disease with stage 1 through stage 4 chronic kidney disease, or unspecified chronic kidney disease: Secondary | ICD-10-CM | POA: Diagnosis not present

## 2017-07-01 DIAGNOSIS — G35 Multiple sclerosis: Secondary | ICD-10-CM | POA: Diagnosis not present

## 2017-07-01 DIAGNOSIS — Z466 Encounter for fitting and adjustment of urinary device: Secondary | ICD-10-CM | POA: Diagnosis not present

## 2017-07-01 DIAGNOSIS — M5416 Radiculopathy, lumbar region: Secondary | ICD-10-CM | POA: Diagnosis not present

## 2017-07-01 DIAGNOSIS — J449 Chronic obstructive pulmonary disease, unspecified: Secondary | ICD-10-CM | POA: Diagnosis not present

## 2017-07-01 DIAGNOSIS — M1990 Unspecified osteoarthritis, unspecified site: Secondary | ICD-10-CM | POA: Diagnosis not present

## 2017-07-01 DIAGNOSIS — F419 Anxiety disorder, unspecified: Secondary | ICD-10-CM | POA: Diagnosis not present

## 2017-07-01 DIAGNOSIS — I739 Peripheral vascular disease, unspecified: Secondary | ICD-10-CM | POA: Diagnosis not present

## 2017-07-01 DIAGNOSIS — Z7951 Long term (current) use of inhaled steroids: Secondary | ICD-10-CM | POA: Diagnosis not present

## 2017-07-01 NOTE — Telephone Encounter (Signed)
Copied from [place] [id number]. Topic: Inquiry >> Jun 30, 2017  4:46 PM Oliver Pila B wrote: Reason for CRM: [facility] called for verbals of  OT 2x week 3wks; home health for 2x week for 3  Contact @ [phone]  Previous note mentions ID, do I need to defer to them for these orders? Thanks

## 2017-07-01 NOTE — Telephone Encounter (Signed)
I'm fine to approve those orders Thank you for double checking

## 2017-07-02 ENCOUNTER — Telehealth: Payer: Self-pay | Admitting: *Deleted

## 2017-07-02 DIAGNOSIS — L03116 Cellulitis of left lower limb: Secondary | ICD-10-CM | POA: Diagnosis not present

## 2017-07-02 NOTE — Telephone Encounter (Signed)
I spoke to pt yesterday about her scan she had was good from lymphoma standpoint. She can have her port removed if she would like to but Dr. Janese Banks thought that with all the other problems she has maybe she would like to keep the access in. Patient states no. No one but cancer center ever used it and it itches and bothers her so she wants it out.  I told her that I will research who put it in and contact that office for removal.  I found that Dr. Jamal Collin put it in 07/22/16.  I called [facility] and Dr/ Bary Castilla will remove it since Dr. Jamal Collin is reitred. The appt is 07/24/2017 at 3:30 and pt to arrive 15 min early. I faxed port removal to [place] office. I tried calling pt but her home phone does not have voicemail set up so I will try later

## 2017-07-02 NOTE — Telephone Encounter (Signed)
Tilda Franco with [facility], gave her verbal ok for pt's orders.

## 2017-07-03 ENCOUNTER — Inpatient Hospital Stay: Payer: PPO | Attending: Oncology

## 2017-07-03 ENCOUNTER — Telehealth: Payer: Self-pay | Admitting: *Deleted

## 2017-07-03 NOTE — Telephone Encounter (Signed)
Called pt's house and spoke to husband and told him that I spoke to [employer]. And she wanted to have port out. She said it was itching her. Dr. Bary Castilla will remove it and the appt is 3/28 and be there at 3:15 pm. I gave him address and telephone number there. He states that he does not remember where it is but his wife will know.

## 2017-07-04 DIAGNOSIS — G35 Multiple sclerosis: Secondary | ICD-10-CM | POA: Diagnosis not present

## 2017-07-04 DIAGNOSIS — R600 Localized edema: Secondary | ICD-10-CM | POA: Diagnosis not present

## 2017-07-04 DIAGNOSIS — G629 Polyneuropathy, unspecified: Secondary | ICD-10-CM | POA: Diagnosis not present

## 2017-07-04 DIAGNOSIS — I129 Hypertensive chronic kidney disease with stage 1 through stage 4 chronic kidney disease, or unspecified chronic kidney disease: Secondary | ICD-10-CM | POA: Diagnosis not present

## 2017-07-04 DIAGNOSIS — F3341 Major depressive disorder, recurrent, in partial remission: Secondary | ICD-10-CM | POA: Diagnosis not present

## 2017-07-04 DIAGNOSIS — M5416 Radiculopathy, lumbar region: Secondary | ICD-10-CM | POA: Diagnosis not present

## 2017-07-04 DIAGNOSIS — J449 Chronic obstructive pulmonary disease, unspecified: Secondary | ICD-10-CM | POA: Diagnosis not present

## 2017-07-04 DIAGNOSIS — L97821 Non-pressure chronic ulcer of other part of left lower leg limited to breakdown of skin: Secondary | ICD-10-CM | POA: Diagnosis not present

## 2017-07-04 DIAGNOSIS — N183 Chronic kidney disease, stage 3 (moderate): Secondary | ICD-10-CM | POA: Diagnosis not present

## 2017-07-04 DIAGNOSIS — N312 Flaccid neuropathic bladder, not elsewhere classified: Secondary | ICD-10-CM | POA: Diagnosis not present

## 2017-07-04 DIAGNOSIS — I739 Peripheral vascular disease, unspecified: Secondary | ICD-10-CM | POA: Diagnosis not present

## 2017-07-04 DIAGNOSIS — F419 Anxiety disorder, unspecified: Secondary | ICD-10-CM | POA: Diagnosis not present

## 2017-07-07 ENCOUNTER — Other Ambulatory Visit: Payer: Self-pay

## 2017-07-07 ENCOUNTER — Emergency Department: Payer: PPO

## 2017-07-07 ENCOUNTER — Ambulatory Visit: Payer: Self-pay | Admitting: *Deleted

## 2017-07-07 ENCOUNTER — Inpatient Hospital Stay
Admission: EM | Admit: 2017-07-07 | Discharge: 2017-07-08 | DRG: 603 | Disposition: A | Discharge status: Home Health | Payer: PPO | Attending: Internal Medicine | Admitting: Internal Medicine

## 2017-07-07 ENCOUNTER — Encounter: Payer: Self-pay | Admitting: Emergency Medicine

## 2017-07-07 DIAGNOSIS — F419 Anxiety disorder, unspecified: Secondary | ICD-10-CM | POA: Diagnosis not present

## 2017-07-07 DIAGNOSIS — L0390 Cellulitis, unspecified: Secondary | ICD-10-CM | POA: Diagnosis present

## 2017-07-07 DIAGNOSIS — F909 Attention-deficit hyperactivity disorder, unspecified type: Secondary | ICD-10-CM | POA: Diagnosis not present

## 2017-07-07 DIAGNOSIS — Z87891 Personal history of nicotine dependence: Secondary | ICD-10-CM | POA: Diagnosis not present

## 2017-07-07 DIAGNOSIS — M5416 Radiculopathy, lumbar region: Secondary | ICD-10-CM | POA: Diagnosis not present

## 2017-07-07 DIAGNOSIS — N179 Acute kidney failure, unspecified: Secondary | ICD-10-CM | POA: Diagnosis not present

## 2017-07-07 DIAGNOSIS — Z8572 Personal history of non-Hodgkin lymphomas: Secondary | ICD-10-CM

## 2017-07-07 DIAGNOSIS — L03116 Cellulitis of left lower limb: Secondary | ICD-10-CM | POA: Diagnosis not present

## 2017-07-07 DIAGNOSIS — N312 Flaccid neuropathic bladder, not elsewhere classified: Secondary | ICD-10-CM | POA: Diagnosis not present

## 2017-07-07 DIAGNOSIS — F3341 Major depressive disorder, recurrent, in partial remission: Secondary | ICD-10-CM | POA: Diagnosis not present

## 2017-07-07 DIAGNOSIS — G35 Multiple sclerosis: Secondary | ICD-10-CM | POA: Diagnosis not present

## 2017-07-07 DIAGNOSIS — N183 Chronic kidney disease, stage 3 (moderate): Secondary | ICD-10-CM | POA: Diagnosis not present

## 2017-07-07 DIAGNOSIS — R600 Localized edema: Secondary | ICD-10-CM | POA: Diagnosis not present

## 2017-07-07 DIAGNOSIS — L03115 Cellulitis of right lower limb: Secondary | ICD-10-CM | POA: Diagnosis not present

## 2017-07-07 DIAGNOSIS — Z6841 Body Mass Index (BMI) 40.0 and over, adult: Secondary | ICD-10-CM

## 2017-07-07 DIAGNOSIS — G629 Polyneuropathy, unspecified: Secondary | ICD-10-CM | POA: Diagnosis not present

## 2017-07-07 DIAGNOSIS — J449 Chronic obstructive pulmonary disease, unspecified: Secondary | ICD-10-CM | POA: Diagnosis not present

## 2017-07-07 DIAGNOSIS — L97821 Non-pressure chronic ulcer of other part of left lower leg limited to breakdown of skin: Secondary | ICD-10-CM | POA: Diagnosis not present

## 2017-07-07 DIAGNOSIS — I739 Peripheral vascular disease, unspecified: Secondary | ICD-10-CM | POA: Diagnosis not present

## 2017-07-07 DIAGNOSIS — I129 Hypertensive chronic kidney disease with stage 1 through stage 4 chronic kidney disease, or unspecified chronic kidney disease: Secondary | ICD-10-CM | POA: Diagnosis present

## 2017-07-07 DIAGNOSIS — F329 Major depressive disorder, single episode, unspecified: Secondary | ICD-10-CM | POA: Diagnosis present

## 2017-07-07 DIAGNOSIS — I10 Essential (primary) hypertension: Secondary | ICD-10-CM | POA: Diagnosis not present

## 2017-07-07 DIAGNOSIS — L539 Erythematous condition, unspecified: Secondary | ICD-10-CM | POA: Diagnosis not present

## 2017-07-07 LAB — CBC WITH DIFFERENTIAL/PLATELET
Basophils Absolute: 0.1 K/uL (ref 0–0.1)
Basophils Relative: 1 %
Eosinophils Absolute: 0.2 K/uL (ref 0–0.7)
Eosinophils Relative: 4 %
HCT: 37.8 % (ref 35.0–47.0)
Hemoglobin: 12.7 g/dL (ref 12.0–16.0)
Lymphocytes Relative: 6 %
Lymphs Abs: 0.3 K/uL — ABNORMAL LOW (ref 1.0–3.6)
MCH: 29.5 pg (ref 26.0–34.0)
MCHC: 33.6 g/dL (ref 32.0–36.0)
MCV: 87.7 fL (ref 80.0–100.0)
Monocytes Absolute: 0.5 K/uL (ref 0.2–0.9)
Monocytes Relative: 10 %
Neutro Abs: 4.5 K/uL (ref 1.4–6.5)
Neutrophils Relative %: 79 %
Platelets: 276 K/uL (ref 150–440)
RBC: 4.31 MIL/uL (ref 3.80–5.20)
RDW: 14.9 % — ABNORMAL HIGH (ref 11.5–14.5)
WBC: 5.6 K/uL (ref 3.6–11.0)

## 2017-07-07 LAB — URINALYSIS, COMPLETE (UACMP) WITH MICROSCOPIC
Bacteria, UA: NONE SEEN
Bilirubin Urine: NEGATIVE
Glucose, UA: NEGATIVE mg/dL
Hgb urine dipstick: NEGATIVE
Ketones, ur: NEGATIVE mg/dL
Nitrite: NEGATIVE
Protein, ur: NEGATIVE mg/dL
Specific Gravity, Urine: 1.005 (ref 1.005–1.030)
pH: 6 (ref 5.0–8.0)

## 2017-07-07 LAB — COMPREHENSIVE METABOLIC PANEL WITH GFR
ALT: 20 U/L (ref 14–54)
AST: 25 U/L (ref 15–41)
Albumin: 4.5 g/dL (ref 3.5–5.0)
Alkaline Phosphatase: 72 U/L (ref 38–126)
Anion gap: 10 (ref 5–15)
BUN: 22 mg/dL — ABNORMAL HIGH (ref 6–20)
CO2: 25 mmol/L (ref 22–32)
Calcium: 9.4 mg/dL (ref 8.9–10.3)
Chloride: 97 mmol/L — ABNORMAL LOW (ref 101–111)
Creatinine, Ser: 1.63 mg/dL — ABNORMAL HIGH (ref 0.44–1.00)
GFR calc Af Amer: 37 mL/min — ABNORMAL LOW
GFR calc non Af Amer: 32 mL/min — ABNORMAL LOW
Glucose, Bld: 100 mg/dL — ABNORMAL HIGH (ref 65–99)
Potassium: 3.7 mmol/L (ref 3.5–5.1)
Sodium: 132 mmol/L — ABNORMAL LOW (ref 135–145)
Total Bilirubin: 0.7 mg/dL (ref 0.3–1.2)
Total Protein: 8 g/dL (ref 6.5–8.1)

## 2017-07-07 LAB — BASIC METABOLIC PANEL WITH GFR
Anion gap: 11 (ref 5–15)
BUN: 24 mg/dL — ABNORMAL HIGH (ref 6–20)
CO2: 24 mmol/L (ref 22–32)
Calcium: 9.2 mg/dL (ref 8.9–10.3)
Chloride: 98 mmol/L — ABNORMAL LOW (ref 101–111)
Creatinine, Ser: 1.51 mg/dL — ABNORMAL HIGH (ref 0.44–1.00)
GFR calc Af Amer: 40 mL/min — ABNORMAL LOW
GFR calc non Af Amer: 35 mL/min — ABNORMAL LOW
Glucose, Bld: 110 mg/dL — ABNORMAL HIGH (ref 65–99)
Potassium: 3.7 mmol/L (ref 3.5–5.1)
Sodium: 133 mmol/L — ABNORMAL LOW (ref 135–145)

## 2017-07-07 LAB — LACTIC ACID, PLASMA
Lactic Acid, Venous: 1.6 mmol/L (ref 0.5–1.9)
Lactic Acid, Venous: 0.9 mmol/L (ref 0.5–1.9)

## 2017-07-07 LAB — SEDIMENTATION RATE: Sed Rate: 46 mm/hr — ABNORMAL HIGH (ref 0–30)

## 2017-07-07 MED ORDER — ACETAMINOPHEN 325 MG PO TABS: 650.0000 mg | ORAL_TABLET | Freq: Four times a day (QID) | ORAL | Status: DC | PRN

## 2017-07-07 MED ORDER — ACETAMINOPHEN 650 MG RE SUPP: 650.0000 mg | Freq: Four times a day (QID) | RECTAL | Status: DC | PRN

## 2017-07-07 MED ORDER — SODIUM CHLORIDE 0.9 % IV SOLN
INTRAVENOUS | Status: DC
  Administered 2017-07-07: 21:00:00 via INTRAVENOUS

## 2017-07-07 MED ORDER — ONDANSETRON HCL 4 MG PO TABS: 4.0000 mg | ORAL_TABLET | Freq: Four times a day (QID) | ORAL | Status: DC | PRN

## 2017-07-07 MED ORDER — PIPERACILLIN-TAZOBACTAM 3.375 G IVPB 30 MIN
3.3750 g | Freq: Once | INTRAVENOUS | Status: AC
  Administered 2017-07-07: 3.375 g via INTRAVENOUS
  Filled 2017-07-07: qty 50

## 2017-07-07 MED ORDER — GABAPENTIN 600 MG PO TABS
600.0000 mg | ORAL_TABLET | Freq: Three times a day (TID) | ORAL | Status: DC
  Administered 2017-07-07 – 2017-07-08 (×2): 600 mg via ORAL
  Filled 2017-07-07 (×2): qty 1

## 2017-07-07 MED ORDER — POLYETHYLENE GLYCOL 3350 17 G PO PACK: 17.0000 g | PACK | Freq: Every day | ORAL | Status: DC | PRN

## 2017-07-07 MED ORDER — VANCOMYCIN HCL IN DEXTROSE 1-5 GM/200ML-% IV SOLN
1000.0000 mg | Freq: Once | INTRAVENOUS | Status: AC
  Administered 2017-07-07: 1000 mg via INTRAVENOUS
  Filled 2017-07-07: qty 200

## 2017-07-07 MED ORDER — OXYCODONE HCL 5 MG PO TABS
ORAL_TABLET | ORAL | Status: AC
  Filled 2017-07-07: qty 1

## 2017-07-07 MED ORDER — CLONAZEPAM 1 MG PO TABS
1.0000 mg | ORAL_TABLET | Freq: Every day | ORAL | Status: DC
  Administered 2017-07-07: 1 mg via ORAL
  Filled 2017-07-07: qty 1

## 2017-07-07 MED ORDER — VANCOMYCIN HCL 10 G IV SOLR
1250.0000 mg | INTRAVENOUS | Status: DC
  Administered 2017-07-07: 1250 mg via INTRAVENOUS
  Filled 2017-07-07 (×2): qty 1250

## 2017-07-07 MED ORDER — BUPROPION HCL ER (XL) 300 MG PO TB24
300.0000 mg | ORAL_TABLET | Freq: Every morning | ORAL | Status: DC
  Administered 2017-07-08: 300 mg via ORAL
  Filled 2017-07-07: qty 1

## 2017-07-07 MED ORDER — ADULT MULTIVITAMIN W/MINERALS CH
1.0000 | ORAL_TABLET | Freq: Every evening | ORAL | Status: DC
  Administered 2017-07-07: 1 via ORAL
  Filled 2017-07-07: qty 1

## 2017-07-07 MED ORDER — METHOCARBAMOL 500 MG PO TABS
500.0000 mg | ORAL_TABLET | Freq: Three times a day (TID) | ORAL | Status: DC | PRN
  Filled 2017-07-07: qty 1

## 2017-07-07 MED ORDER — METHYLPHENIDATE HCL 10 MG PO TABS
10.0000 mg | ORAL_TABLET | Freq: Two times a day (BID) | ORAL | Status: DC
  Administered 2017-07-08 (×2): 10 mg via ORAL
  Filled 2017-07-07 (×2): qty 1

## 2017-07-07 MED ORDER — ENOXAPARIN SODIUM 40 MG/0.4ML ~~LOC~~ SOLN
40.0000 mg | Freq: Two times a day (BID) | SUBCUTANEOUS | Status: DC
  Administered 2017-07-07 – 2017-07-08 (×2): 40 mg via SUBCUTANEOUS
  Filled 2017-07-07 (×2): qty 0.4

## 2017-07-07 MED ORDER — DULOXETINE HCL 60 MG PO CPEP
60.0000 mg | ORAL_CAPSULE | Freq: Every morning | ORAL | Status: DC
  Administered 2017-07-08: 60 mg via ORAL
  Filled 2017-07-07: qty 1
  Filled 2017-07-07: qty 2

## 2017-07-07 MED ORDER — QUETIAPINE FUMARATE ER 50 MG PO TB24
150.0000 mg | ORAL_TABLET | Freq: Every day | ORAL | Status: DC
  Administered 2017-07-07: 150 mg via ORAL
  Filled 2017-07-07 (×4): qty 3

## 2017-07-07 MED ORDER — ONDANSETRON HCL 4 MG/2ML IJ SOLN: 4.0000 mg | Freq: Four times a day (QID) | INTRAMUSCULAR | Status: DC | PRN

## 2017-07-07 MED ORDER — TIZANIDINE HCL 4 MG PO TABS
8.0000 mg | ORAL_TABLET | Freq: Two times a day (BID) | ORAL | Status: DC
  Administered 2017-07-07 – 2017-07-08 (×2): 8 mg via ORAL
  Filled 2017-07-07 (×3): qty 2

## 2017-07-07 MED ORDER — SODIUM CHLORIDE 0.9 % IV SOLN
3.0000 g | Freq: Four times a day (QID) | INTRAVENOUS | Status: DC
  Administered 2017-07-07 – 2017-07-08 (×3): 3 g via INTRAVENOUS
  Filled 2017-07-07 (×6): qty 3

## 2017-07-07 MED ORDER — TOPIRAMATE 25 MG PO TABS
50.0000 mg | ORAL_TABLET | Freq: Every day | ORAL | Status: DC
  Administered 2017-07-08: 50 mg via ORAL
  Filled 2017-07-07: qty 2

## 2017-07-07 MED ORDER — VITAMIN C 500 MG PO TABS
500.0000 mg | ORAL_TABLET | Freq: Every evening | ORAL | Status: DC
  Administered 2017-07-07: 500 mg via ORAL
  Filled 2017-07-07 (×2): qty 1

## 2017-07-07 MED ORDER — OXYCODONE HCL 5 MG PO TABS
5.0000 mg | ORAL_TABLET | ORAL | Status: DC | PRN
  Administered 2017-07-07: 5 mg via ORAL
  Filled 2017-07-07: qty 1

## 2017-07-07 NOTE — ED Notes (Addendum)
Pt c/o cellulitis to both lower legs. Hx MS. L leg more red. Pt states R leg more painful. Pt has foley in place. Family member states pt was taking cipro a few weeks ago for UTI and it "made her rigid." finished taking cipro a week ago. Pt states "I don't walk a whole lot." uses walker at home. home health care nurse was concerned about cellulitis on L leg. Has PT at home. Family member states yesterday BP was low and concerned about pt color.

## 2017-07-07 NOTE — Telephone Encounter (Signed)
I see that she is in the ER right now

## 2017-07-07 NOTE — Progress Notes (Signed)
Anticoagulation monitoring(Lovenox):  67yo  female ordered Lovenox 40 mg Q24h  Filed Weights   07/07/17 1509 07/07/17 2003  Weight: 300 lb (136.1 kg) 299 lb 6.4 oz (135.8 kg)   BMI 51.3   Lab Results  Component Value Date   CREATININE 1.51 (H) 07/07/2017   CREATININE 1.63 (H) 07/07/2017   CREATININE 1.27 (H) 06/19/2017   Estimated Creatinine Clearance: 50.4 mL/min (A) (by C-G formula based on SCr of 1.51 mg/dL (H)). Hemoglobin & Hematocrit     Component Value Date/Time   HGB 12.7 07/07/2017 1518   HGB 13.6 08/01/2013 2312   HCT 37.8 07/07/2017 1518   HCT 40.2 08/01/2013 2312     Per Protocol for Patient with estCrcl > 30 ml/min and BMI > 40, will transition to Lovenox 40 mg Q12h.

## 2017-07-07 NOTE — Telephone Encounter (Signed)
Pt's caregiver '[name]' reports pt presents with LLE edema, redness and warmth. States onset 2 days ago. Pt has H/O cellulitis LLE.  Caregiver states moderate but "non-pitting" edema from knee to foot; "intensely red with streaks at ankle." Warm to touch. Denies pain, SOB, fever. Caregiver states "very weak. Initially pt adamantly refused ED. Spoke with Suanne Marker, FC at office; no openings today, appt available tomorrow. Pt declined, stated per 'Lana' she would go to ED now. Caregiver hung up before completing triage call/ advise. Reason for Disposition . [1] Red area or streak [2] large (> 2 in. or 5 cm)  Answer Assessment - Initial Assessment Questions 1. ONSET: "When did the swelling start?" (e.g., minutes, hours, days)     2 days ago 2. LOCATION: "What part of the leg is swollen?"  "Are both legs swollen or just one leg?"     Left leg only; knee through foot 3. SEVERITY: "How bad is the swelling?" (e.g., localized; mild, moderate, severe)  - Localized - small area of swelling localized to one leg  - MILD pedal edema - swelling limited to foot and ankle, pitting edema < 1/4 inch (6 mm) deep, rest and elevation eliminate most or all swelling  - MODERATE edema - swelling of lower leg to knee, pitting edema > 1/4 inch (6 mm) deep, rest and elevation only partially reduce swelling  - SEVERE edema - swelling extends above knee, facial or hand swelling present      Moderate, non-pitting per caregiver.  4. REDNESS: "Does the swelling look red or infected?"     "Intensely" red from ankle to knee 5. PAIN: "Is the swelling painful to touch?" If so, ask: "How painful is it?"   (Scale 1-10; mild, moderate or severe)     No 6. FEVER: "Do you have a fever?" If so, ask: "What is it, how was it measured, and when did it start?"      No 7. CAUSE: "What do you think is causing the leg swelling?"     H/O cellulitis 8. MEDICAL HISTORY: "Do you have a history of heart failure, kidney disease, liver failure, or  cancer?"      9. RECURRENT SYMPTOM: "Have you had leg swelling before?" If so, ask: "When was the last time?" "What happened that time?"     Yes, cellulitis LLE 10. OTHER SYMPTOMS: "Do you have any other symptoms?" (e.g., chest pain, difficulty breathing)        No  Protocols used: LEG SWELLING AND EDEMA-A-AH

## 2017-07-07 NOTE — H&P (Addendum)
[facility] [place] NAME: [name]    MR#:  [id number]  DATE OF BIRTH:  1950/09/26  DATE OF ADMISSION:  07/07/2017  PRIMARY CARE PHYSICIAN: Arnetha Courser, MD   REQUESTING/REFERRING PHYSICIAN:  Schuyler Amor, MD     CHIEF COMPLAINT:   Chief Complaint  Patient presents with  . Cellulitis    HISTORY OF PRESENT ILLNESS: [name]  is a 67 y.o. female with a known history of multiple sclerosis, anxiety, depression, COPD, morbid obesity, chronic lower extremity edema, essential hypertension and morbid obesity who is presenting to the hospital with redness and swelling of the left lower extremity as well as right lower extremity.  Patient states that the symptoms have been going on for the past 3 days.  Has not had any fevers or chills.  Denies any nausea vomiting or diarrhea.  Yesterday she was noted to have blood pressure that was low yesterday.She also complains of weakness.  Patient's daughter also reports that patient had a urine that had a lot of sediment. PAST MEDICAL HISTORY:   Past Medical History:  Diagnosis Date  . Abdominal aortic atherosclerosis ([place]) 11/11/2016  . ADHD   . Anxiety   . COPD (chronic obstructive pulmonary disease) ([place])   . Depression    major depressive  . Dyspnea    doe  . Edema    left leg  . Follicular lymphoma ([place])    B Cell  . Follicular lymphoma grade II ([place])   . Hypertension   . Hypotension    idiopathic  . Kyphoscoliosis and scoliosis 11/26/2011  . Morbid obesity ([place]) 01/05/2016  . Multiple sclerosis ([place])   . Multiple sclerosis ([place])    1980's  . Neuromuscular disorder ([place])   . Obstructive and reflux uropathy    foley  . Pain    atypical facial  . Peripheral vascular disease of lower extremity with ulceration ([place]) 11/08/2015  . Skin ulcer ([place]) 11/08/2015  . Weakness    generalized. has MS    PAST SURGICAL HISTORY:  Past Surgical History:  Procedure Laterality Date  . BACK  SURGERY N/A 2002  . CYST EXCISION     lower back  . INGUINAL LYMPH NODE BIOPSY Left 07/04/2016   Procedure: INGUINAL LYMPH NODE BIOPSY;  Surgeon: Christene Lye, MD;  Location: [facility] ORS;  Service: General;  Laterality: Left;  . PORTACATH PLACEMENT N/A 07/22/2016   Procedure: INSERTION PORT-A-CATH;  Surgeon: Christene Lye, MD;  Location: [facility] ORS;  Service: General;  Laterality: N/A;  . TONSILLECTOMY AND ADENOIDECTOMY    . TUBAL LIGATION      SOCIAL HISTORY:  Social History   Tobacco Use  . Smoking status: Former Smoker    Packs/day: 1.00    Years: 20.00    Pack years: 20.00    Types: Cigarettes    Start date: 04/30/1995    Last attempt to quit: 02/03/2016    Years since quitting: 1.4  . Smokeless tobacco: Never Used  Substance Use Topics  . Alcohol use: No    Alcohol/week: 0.0 oz    FAMILY HISTORY:  Family History  Problem Relation Age of Onset  . COPD Mother   . Diabetes Mother   . Heart failure Mother   . Alcohol abuse Father   . Kidney disease Father   . Kidney failure Father   . Arthritis Sister   . CAD Maternal Grandmother   . Stroke Maternal Grandfather   .  Arthritis Sister   . Mental illness Sister   . Arthritis Brother     DRUG ALLERGIES: No Known Allergies  REVIEW OF SYSTEMS:   CONSTITUTIONAL: No fever, positive fatigue or positive weakness.  EYES: No blurred or double vision.  EARS, NOSE, AND THROAT: No tinnitus or ear pain.  RESPIRATORY: No cough, shortness of breath, wheezing or hemoptysis.  CARDIOVASCULAR: No chest pain, orthopnea, edema.  GASTROINTESTINAL: No nausea, vomiting, diarrhea or abdominal pain.  GENITOURINARY: No dysuria, hematuria.  ENDOCRINE: No polyuria, nocturia,  HEMATOLOGY: No anemia, easy bruising or bleeding SKIN: Bilateral lower extremity erythema  mUSCULOSKELETAL: No joint pain or arthritis.   NEUROLOGIC: No tingling, numbness, weakness.  PSYCHIATRY: No anxiety or depression.   MEDICATIONS AT HOME:  Prior to  Admission medications   Medication Sig Start Date End Date Taking? Authorizing Provider  BIOTIN PO Take by mouth daily. Pt taking 500 mcg daily   Yes [provider]  buPROPion (WELLBUTRIN XL) 300 MG 24 hr tablet Take 300 mg by mouth every morning.  05/28/16  Yes [provider]  clonazePAM (KLONOPIN) 1 MG tablet Take 1 tablet (1 mg total) by mouth at bedtime. 01/06/17  Yes Sudini, Alveta Heimlich, MD  DULoxetine (CYMBALTA) 60 MG capsule Take 1 capsule (60 mg total) by mouth every morning. 10/14/16  Yes Vaughan Basta, MD  gabapentin (NEURONTIN) 600 MG tablet Take 1 tablet (600 mg total) by mouth 3 (three) times daily. 02/09/17  Yes Mody, Ulice Bold, MD  hydrALAZINE (APRESOLINE) 25 MG tablet Take 2 tablets (50 mg total) by mouth 3 (three) times daily. 06/27/17  Yes Lada, Satira Anis, MD  hydrochlorothiazide (HYDRODIURIL) 25 MG tablet Take 0.5 tablets (12.5 mg total) by mouth daily. 06/05/17  Yes Lada, Satira Anis, MD  interferon beta-1a (AVONEX) 30 MCG/0.5ML PSKT injection Inject 30 mcg into the muscle every Monday.   Yes [provider]  lisinopril (PRINIVIL,ZESTRIL) 10 MG tablet Take 2 tablets (20 mg total) by mouth daily. 06/25/17  Yes Lada, Satira Anis, MD  methylphenidate (RITALIN) 10 MG tablet Take 1 tablet (10 mg total) by mouth 2 (two) times daily. Patient taking differently: Take 10 mg by mouth 2 (two) times daily. At breakfast and at lunchtime 10/14/16  Yes Vaughan Basta, MD  Multiple Vitamin (MULTIVITAMIN WITH MINERALS) TABS tablet Take 1 tablet by mouth every evening.    Yes [provider]  QUEtiapine Fumarate (SEROQUEL XR) 150 MG 24 hr tablet Take 150 mg by mouth at bedtime.    Yes [provider]  tiZANidine (ZANAFLEX) 4 MG tablet Take 2 tablets by mouth 2 (two) times daily. 06/20/17  Yes [provider]  topiramate (TOPAMAX) 50 MG tablet Take 1 tablet (50 mg total) by mouth daily. Patient taking differently: Take 50 mg by mouth daily with  lunch.  10/14/16  Yes Vaughan Basta, MD  vitamin C (ASCORBIC ACID) 500 MG tablet Take 500 mg by mouth every evening.    Yes [provider]  ciprofloxacin (CIPRO) 250 MG tablet Take 1 tablet (250 mg total) by mouth 2 (two) times daily. Patient not taking: Reported on 07/07/2017 06/20/17   Arnetha Courser, MD  Diapers & Supplies (HUGGIES PULL-UPS) MISC 1 each by Does not apply route 2 (two) times daily. Dx urinary incontinence, MS; LON 99 months 02/03/17   Arnetha Courser, MD  lactobacillus acidophilus & bulgar (LACTINEX) chewable tablet Chew 1 tablet by mouth 3 (three) times daily with meals. Patient not taking: Reported on 07/07/2017 05/15/17   Salary,  Montell D, MD  lidocaine-prilocaine (EMLA) cream Apply 1 application topically as needed. apply cream over the port site and then place small piece of saran wrap over the cream to protect your clothing 11/07/16   Sindy Guadeloupe, MD  methocarbamol (ROBAXIN) 500 MG tablet Take 1 tablet (500 mg total) by mouth every 8 (eight) hours as needed for muscle spasms. Patient not taking: Reported on 06/13/2017 05/01/17   Arnetha Courser, MD  polyethylene glycol powder (GLYCOLAX/MIRALAX) powder Take 17 g by mouth daily as needed for mild constipation. Mixed in water Patient taking differently: Take 17 g by mouth daily as needed for mild constipation. For constipation 01/06/17   [name], MD  potassium chloride (K-DUR) 10 MEQ tablet Take 2 tablets (20 mEq total) by mouth daily. Patient not taking: Reported on 06/13/2017 05/15/17   Salary, Holly Bodily D, MD      PHYSICAL EXAMINATION:   VITAL SIGNS: Blood pressure (!) 135/93, pulse 68, temperature (!) 97.5 F (36.4 C), temperature source Oral, resp. rate 16, height 5\' 4"  (1.626 m), weight 300 lb (136.1 kg), SpO2 100 %.  GENERAL:  67 y.o.-year-old patient lying in the bed with no acute distress.  EYES: Pupils equal, round, reactive to light and accommodation. No scleral icterus. Extraocular muscles  intact.  HEENT: Head atraumatic, normocephalic. Oropharynx and nasopharynx clear.  NECK:  Supple, no jugular venous distention. No thyroid enlargement, no tenderness.  LUNGS: Normal breath sounds bilaterally, no wheezing, rales,rhonchi or crepitation. No use of accessory muscles of respiration.  CARDIOVASCULAR: S1, S2 normal. No murmurs, rubs, or gallops.  ABDOMEN: Soft, nontender, nondistended. Bowel sounds present. No organomegaly or mass.  EXTREMITIES: No pedal edema, cyanosis, or clubbing.  NEUROLOGIC: Cranial nerves II through XII are intact. Muscle strength 5/5 in all extremities. Sensation intact. Gait not checked.  PSYCHIATRIC: The patient is alert and oriented x 3.  SKIN: Bilateral lower extremity erythema left greater than right left extending up to the knee and streaking up to the to the thigh  LABORATORY PANEL:   CBC Recent Labs  Lab 07/07/17 1518  WBC 5.6  HGB 12.7  HCT 37.8  PLT 276  MCV 87.7  MCH 29.5  MCHC 33.6  RDW 14.9*  LYMPHSABS 0.3*  MONOABS 0.5  EOSABS 0.2  BASOSABS 0.1   ------------------------------------------------------------------------------------------------------------------  Chemistries  Recent Labs  Lab 07/07/17 1518  NA 132*  K 3.7  CL 97*  CO2 25  GLUCOSE 100*  BUN 22*  CREATININE 1.63*  CALCIUM 9.4  AST 25  ALT 20  ALKPHOS 72  BILITOT 0.7   ------------------------------------------------------------------------------------------------------------------ estimated creatinine clearance is 46.8 mL/min (A) (by C-G formula based on SCr of 1.63 mg/dL (H)). ------------------------------------------------------------------------------------------------------------------ No results for input(s): TSH, T4TOTAL, T3FREE, THYROIDAB in the last 72 hours.  Invalid input(s): FREET3   Coagulation profile No results for input(s): INR, PROTIME in the last 168  hours. ------------------------------------------------------------------------------------------------------------------- No results for input(s): DDIMER in the last 72 hours. -------------------------------------------------------------------------------------------------------------------  Cardiac Enzymes No results for input(s): CKMB, TROPONINI, MYOGLOBIN in the last 168 hours.  Invalid input(s): CK ------------------------------------------------------------------------------------------------------------------ Invalid input(s): POCBNP  ---------------------------------------------------------------------------------------------------------------  Urinalysis    Component Value Date/Time   COLORURINE YELLOW (A) 06/19/2017 1600   APPEARANCEUR TURBID (A) 06/19/2017 1600   APPEARANCEUR Hazy 08/02/2013 0020   LABSPEC 1.011 06/19/2017 1600   LABSPEC 1.012 08/02/2013 0020   PHURINE 7.0 06/19/2017 1600   GLUCOSEU NEGATIVE 06/19/2017 1600   GLUCOSEU Negative 08/02/2013 0020   HGBUR SMALL (A) 06/19/2017 1600   BILIRUBINUR  NEGATIVE 06/19/2017 1600   BILIRUBINUR Negative 08/02/2013 0020   KETONESUR NEGATIVE 06/19/2017 1600   PROTEINUR 30 (A) 06/19/2017 1600   NITRITE POSITIVE (A) 06/19/2017 1600   LEUKOCYTESUR LARGE (A) 06/19/2017 1600   LEUKOCYTESUR 2+ 08/02/2013 0020     RADIOLOGY: Dg Chest 2 View  Result Date: 07/07/2017 CLINICAL DATA:  Left lower leg erythema. EXAM: CHEST - 2 VIEW COMPARISON:  Radiographs of April 11, 2017. FINDINGS: The heart size and mediastinal contours are within normal limits. Both lungs are clear. No pneumothorax or pleural effusion is noted. Left internal jugular Port-A-Cath is unchanged with distal tip in expected position of SVC. The visualized skeletal structures are unremarkable. IMPRESSION: No active cardiopulmonary disease. Electronically Signed   By: Marijo Conception, M.D.   On: 07/07/2017 16:23    EKG: Orders placed or performed during the  hospital encounter of 04/01/17  . ED EKG  . ED EKG    IMPRESSION AND PLAN: Patient 67 year old white female with history of multiple medical problems presenting with left lower extremity cellulitis  1.  Bilateral lower extremity cellulitis We will treat with IV vancomycin as well as Unasyn  2.  Multiple sclerosis continue her medications as taking at home  3.  Essential hypertension due to hypotension yesterday will hold hydralazine lisinopril and HCTZ  4.  Acute kidney injury on chronic kidney injury we will give her some gentle IV fluid hold  on HCTZ  5.   Anxiety and depression continue home regimen  6.  Miscellaneous Lovenox for DVT prophylaxis      All the records are reviewed and case discussed with ED provider. Management plans discussed with the patient, family and they are in agreement.  CODE STATUS: Code Status History    Date Active Date Inactive Code Status Order ID Comments User Context   05/14/2017 01:52 05/15/2017 19:53 Full Code [id number]  Lance Coon, MD Inpatient   04/01/2017 17:23 04/05/2017 17:42 Full Code [id number]  Daylene Posey, RN Inpatient   02/15/2017 17:15 02/17/2017 20:08 Full Code [id number]  Henreitta Leber, MD Inpatient   02/06/2017 23:56 02/09/2017 18:58 Full Code [id number]  Dustin Flock, MD Inpatient   01/02/2017 19:03 01/06/2017 18:48 Full Code [id number]  [name], MD ED   12/29/2016 12:52 12/30/2016 17:22 Full Code [id number]  Nicholes Mango, MD Inpatient   10/11/2016 23:20 10/14/2016 17:26 Full Code [id number]  Theodoro Grist, MD Inpatient   08/28/2016 11:59 08/30/2016 19:32 Full Code [id number]  Loletha Grayer, MD ED   12/18/2015 19:56 12/19/2015 19:39 Full Code [id number]  Hower, Aaron Mose, MD ED       TOTAL TIME TAKING CARE OF THIS PATIENT:68minutes.    Dustin Flock M.D on 07/07/2017 at 6:23 PM  Between 7am to 6pm - Pager - [phone]  After 6pm go to [email or web address] - password [email or web address] [facility]   [phone]  CC: Primary care physician; Arnetha Courser, MD

## 2017-07-07 NOTE — Progress Notes (Signed)
Pharmacy Antibiotic Note  [name] is a 67 y.o. female admitted on 07/07/2017 with cellulitis.  Pharmacy has been consulted for vancomycin + ampicillin/sulbactam dosing.  Plan: Ampicillin/sulbactam 3 g IV q6h  Vancomycin 1000 mg IV once in ED followed by 1250 mg IV q18h. Goal VT 10-15 mcg/mL  Kinetics: Adjusted body weight = 87 kg, CrCl 51 mL/min Ke: 0.047 Half-life: 13 hrs Cmin ~14 mcg/mL  Height: 5\' 4"  (162.6 cm) Weight: 300 lb (136.1 kg) IBW/kg (Calculated) : 54.7  Temp (24hrs), Avg:97.5 F (36.4 C), Min:97.5 F (36.4 C), Max:97.5 F (36.4 C)  Recent Labs  Lab 07/07/17 1518 07/07/17 1800  WBC 5.6  --   CREATININE 1.63* 1.51*  LATICACIDVEN 1.6  --     Estimated Creatinine Clearance: 50.5 mL/min (A) (by C-G formula based on SCr of 1.51 mg/dL (H)).    No Known Allergies  Antimicrobials this admission: vancomycin 3/11 >>  Ampicillin/sulbactam 3/11 >>   Dose adjustments this admission:  Microbiology results: 3/11 BCx: Sent 3/11 UCx: Sent   Thank you for allowing pharmacy to be a part of this patient's care.  Lenis Noon, PharmD, BCPS Clinical Pharmacist 07/07/2017 6:48 PM

## 2017-07-07 NOTE — ED Notes (Signed)
FIRST NURSE NOTE: EMS from home with c/o left leg redness, hx of cellulitis. Home health care

## 2017-07-07 NOTE — ED Provider Notes (Signed)
[facility] Emergency Department Provider Note  ____________________________________________   I have reviewed the triage vital signs and the nursing notes. Where available I have reviewed prior notes and, if possible and indicated, outside hospital notes.    HISTORY  Chief Complaint Cellulitis    HPI [name] is a 67 y.o. female with a history of ADHD, anxiety, COPD, recurrent edema and cellulitis in the legs, history of hypertension, lymphoma, and multiple sclerosis on interferon.  Patient states that she has had erythema and redness to her left lower leg which is now going up towards her knee, it has been there for a few days.  She also has an indwelling Foley, and was treated for UTI last week.  Ended Cipro about a week ago.  Patient has had to be admitted for her cellulitis in the past.  Denies fever but did have a reading of low blood pressure yesterday.  Has had sepsis in the past.  Patient is complaining of no chest pain or shortness of breath at this time she states she does have pain to her bilateral legs chronically but left leg greater now because of the cellulitic changes.    Past Medical History:  Diagnosis Date  . Abdominal aortic atherosclerosis ([place]) 11/11/2016  . ADHD   . Anxiety   . COPD (chronic obstructive pulmonary disease) ([place])   . Depression    major depressive  . Dyspnea    doe  . Edema    left leg  . Follicular lymphoma ([place])    B Cell  . Follicular lymphoma grade II ([place])   . Hypertension   . Hypotension    idiopathic  . Kyphoscoliosis and scoliosis 11/26/2011  . Morbid obesity ([place]) 01/05/2016  . Multiple sclerosis ([place])   . Multiple sclerosis ([place])    1980's  . Neuromuscular disorder ([place])   . Obstructive and reflux uropathy    foley  . Pain    atypical facial  . Peripheral vascular disease of lower extremity with ulceration ([place]) 11/08/2015  . Skin ulcer ([place]) 11/08/2015  . Weakness    generalized. has MS     Patient Active Problem List   Diagnosis Date Noted  . Recurrent cellulitis of lower extremity 06/25/2017  . Medication monitoring encounter 06/05/2017  . Cellulitis 05/14/2017  . Homicidal ideation 04/25/2017  . Cellulitis of left lower leg 04/25/2017  . Adjustment disorder with mixed disturbance of emotions and conduct 04/23/2017  . Sepsis ([place]) 02/06/2017  . Foot pain, bilateral 02/03/2017  . Tinea pedis 02/03/2017  . Lymphoma ([place]) 01/07/2017  . Pseudomonas urinary tract infection 01/02/2017  . Left ovarian cyst 12/09/2016  . Abdominal aortic atherosclerosis ([place]) 11/11/2016  . Swelling of lower extremity 10/11/2016  . Obstructive sleep apnea 10/11/2016  . Follicular lymphoma of intra-abdominal lymph nodes ([place]) 08/06/2016  . Multiple falls 06/07/2016  . Inguinal adenopathy 05/29/2016  . Obesity, morbid ([place]) 01/05/2016  . Low HDL (under 40) 12/19/2015  . Skin ulcer ([place]) 11/08/2015  . Peripheral vascular disease of lower extremity with ulceration ([place]) 11/08/2015  . CKD (chronic kidney disease) stage 3, GFR 30-59 ml/min (HCC) 11/08/2015  . Constipation due to pain medication 01/31/2015  . Obstructive sleep apnea of adult 01/13/2015  . Pelvic muscle wasting 01/13/2015  . Incomplete bladder emptying 01/13/2015  . Headache, migraine 10/24/2014  . 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 Current tobacco use 10/24/2014  . Lumbar radiculopathy, chronic 10/02/2013  . COPD with bronchial hyperresponsiveness ([place]) 10/02/2013  . Major  depressive disorder, recurrent, in partial remission ([place]) 10/02/2013  . Hypertension goal BP (blood pressure) < 140/90 10/02/2013  . Multiple sclerosis ([place]) 10/02/2013  . Absence of bladder continence 09/25/2012  . Acontractile bladder 02/12/2012  . Bladder retention 02/12/2012  . Narrowing of intervertebral disc space 11/26/2011  . Kyphoscoliosis and scoliosis 11/26/2011    Past Surgical History:  Procedure Laterality Date  . BACK SURGERY N/A  2002  . CYST EXCISION     lower back  . INGUINAL LYMPH NODE BIOPSY Left 07/04/2016   Procedure: INGUINAL LYMPH NODE BIOPSY;  Surgeon: Christene Lye, MD;  Location: [facility] ORS;  Service: General;  Laterality: Left;  . PORTACATH PLACEMENT N/A 07/22/2016   Procedure: INSERTION PORT-A-CATH;  Surgeon: Christene Lye, MD;  Location: [facility] ORS;  Service: General;  Laterality: N/A;  . TONSILLECTOMY AND ADENOIDECTOMY    . TUBAL LIGATION      Prior to Admission medications   Medication Sig Start Date End Date Taking? Authorizing Provider  BIOTIN PO Take by mouth daily. Pt taking 500 mcg daily    [provider]  buPROPion (WELLBUTRIN XL) 300 MG 24 hr tablet Take 300 mg by mouth every morning.  05/28/16   [provider]  ciprofloxacin (CIPRO) 250 MG tablet Take 1 tablet (250 mg total) by mouth 2 (two) times daily. 06/20/17   Lada, Satira Anis, MD  clonazePAM (KLONOPIN) 1 MG tablet Take 1 tablet (1 mg total) by mouth at bedtime. 01/06/17   [name], MD  Diapers & Supplies (HUGGIES PULL-UPS) MISC 1 each by Does not apply route 2 (two) times daily. Dx urinary incontinence, MS; LON 99 months 02/03/17   Arnetha Courser, MD  DULoxetine (CYMBALTA) 60 MG capsule Take 1 capsule (60 mg total) by mouth every morning. 10/14/16   Vaughan Basta, MD  gabapentin (NEURONTIN) 600 MG tablet Take 1 tablet (600 mg total) by mouth 3 (three) times daily. 02/09/17   Bettey Costa, MD  hydrALAZINE (APRESOLINE) 25 MG tablet Take 2 tablets (50 mg total) by mouth 3 (three) times daily. 06/27/17   Arnetha Courser, MD  hydrochlorothiazide (HYDRODIURIL) 25 MG tablet Take 0.5 tablets (12.5 mg total) by mouth daily. 06/05/17   Arnetha Courser, MD  interferon beta-1a (AVONEX) 30 MCG/0.5ML PSKT injection Inject 30 mcg into the muscle every Monday.    [provider]  lactobacillus acidophilus & bulgar (LACTINEX) chewable tablet Chew 1 tablet by mouth 3 (three) times daily with meals. 05/15/17    Salary, Avel Peace, MD  lidocaine-prilocaine (EMLA) cream Apply 1 application topically as needed. apply cream over the port site and then place small piece of saran wrap over the cream to protect your clothing 11/07/16   Sindy Guadeloupe, MD  lisinopril (PRINIVIL,ZESTRIL) 10 MG tablet Take 2 tablets (20 mg total) by mouth daily. 06/25/17   Lada, Satira Anis, MD  methocarbamol (ROBAXIN) 500 MG tablet Take 1 tablet (500 mg total) by mouth every 8 (eight) hours as needed for muscle spasms. Patient not taking: Reported on 06/13/2017 05/01/17   Arnetha Courser, MD  methylphenidate (RITALIN) 10 MG tablet Take 1 tablet (10 mg total) by mouth 2 (two) times daily. Patient taking differently: Take 10 mg by mouth 2 (two) times daily. At breakfast and at lunchtime 10/14/16   Vaughan Basta, MD  Multiple Vitamin (MULTIVITAMIN WITH MINERALS) TABS tablet Take 1 tablet by mouth every evening.     [provider]  polyethylene glycol powder (GLYCOLAX/MIRALAX) powder Take 17  g by mouth daily as needed for mild constipation. Mixed in water Patient taking differently: Take 17 g by mouth daily as needed for mild constipation. For constipation 01/06/17   [name], MD  potassium chloride (K-DUR) 10 MEQ tablet Take 2 tablets (20 mEq total) by mouth daily. Patient not taking: Reported on 06/13/2017 05/15/17   Salary, Avel Peace, MD  QUEtiapine Fumarate (SEROQUEL XR) 150 MG 24 hr tablet Take 150 mg by mouth at bedtime.     [provider]  topiramate (TOPAMAX) 50 MG tablet Take 1 tablet (50 mg total) by mouth daily. Patient taking differently: Take 50 mg by mouth daily with lunch.  10/14/16   Vaughan Basta, MD  vitamin C (ASCORBIC ACID) 500 MG tablet Take 500 mg by mouth every evening.     [provider]    Allergies Patient has no known allergies.  Family History  Problem Relation Age of Onset  . COPD Mother   . Diabetes Mother   . Heart failure Mother   . Alcohol abuse Father    . Kidney disease Father   . Kidney failure Father   . Arthritis Sister   . CAD Maternal Grandmother   . Stroke Maternal Grandfather   . Arthritis Sister   . Mental illness Sister   . Arthritis Brother     Social History Social History   Tobacco Use  . Smoking status: Former Smoker    Packs/day: 1.00    Years: 20.00    Pack years: 20.00    Types: Cigarettes    Start date: 04/30/1995    Last attempt to quit: 02/03/2016    Years since quitting: 1.4  . Smokeless tobacco: Never Used  Substance Use Topics  . Alcohol use: No    Alcohol/week: 0.0 oz  . Drug use: Yes    Types: Marijuana    Comment: smokes THC occasionally per pt     Review of Systems Constitutional: No fever/chills Eyes: No visual changes. ENT: No sore throat. No stiff neck no neck pain Cardiovascular: Denies chest pain. Respiratory: Denies shortness of breath. Gastrointestinal:   no vomiting.  No diarrhea.  No constipation. Genitourinary: Negative for dysuria. Musculoskeletal: + lower extremity swelling Skin: Negative for rash. Neurological: Negative for severe headaches, focal weakness or numbness.   ____________________________________________   PHYSICAL EXAM:  VITAL SIGNS: ED Triage Vitals  Enc Vitals Group     BP 07/07/17 1511 (!) 135/93     Pulse Rate 07/07/17 1511 68     Resp 07/07/17 1511 16     Temp 07/07/17 1511 (!) 97.5 F (36.4 C)     Temp Source 07/07/17 1511 Oral     SpO2 07/07/17 1511 100 %     Weight 07/07/17 1509 300 lb (136.1 kg)     Height 07/07/17 1509 5\' 4"  (1.626 m)     Head Circumference --      Peak Flow --      Pain Score 07/07/17 1509 8     Pain Loc --      Pain Edu? --      Excl. in [place]? --     Constitutional: Alert and oriented. Well appearing and in no acute distress. Eyes: Conjunctivae are normal Head: Atraumatic HEENT: No congestion/rhinnorhea. Mucous membranes are moist.  Oropharynx non-erythematous Neck:   Nontender with no meningismus, no masses, no  stridor Cardiovascular: Normal rate, regular rhythm. Grossly normal heart sounds.  Good peripheral circulation. Respiratory: Normal respiratory effort.  No retractions.  Lungs CTAB. Abdominal: Soft and nontender. No distention. No guarding no rebound Back:  There is no focal tenderness or step off.  there is no midline tenderness there are no lesions noted. there is no CVA tenderness Musculoskeletal: No lower extremity tenderness, no upper extremity tenderness. No joint effusions, no DVT signs strong distal pulses metric bilateral chronic appearing edema Neurologic:  Normal speech and language. No gross focal neurologic deficits are appreciated.  Skin:  Skin is warm, dry and intact.  Erythema noted to the left foot which is itself swollen compared to the right, and there is erythema going up towards the back of the knee.  It is warm to touch.  No crepitus no induration, no abscess, compartments are soft pulses are present Psychiatric: Mood and affect are anxious . Speech and behavior are normal.  ____________________________________________   LABS (all labs ordered are listed, but only abnormal results are displayed)  Labs Reviewed  COMPREHENSIVE METABOLIC PANEL - Abnormal; Notable for the following components:      Result Value   Sodium 132 (*)    Chloride 97 (*)    Glucose, Bld 100 (*)    BUN 22 (*)    Creatinine, Ser 1.63 (*)    GFR calc non Af Amer 32 (*)    GFR calc Af Amer 37 (*)    All other components within normal limits  CBC WITH DIFFERENTIAL/PLATELET - Abnormal; Notable for the following components:   RDW 14.9 (*)    Lymphs Abs 0.3 (*)    All other components within normal limits  LACTIC ACID, PLASMA  LACTIC ACID, PLASMA  URINALYSIS, COMPLETE (UACMP) WITH MICROSCOPIC    Pertinent labs  results that were available during my care of the patient were reviewed by me and considered in my medical decision making (see chart for  details). ____________________________________________  EKG  I personally interpreted any EKGs ordered by me or triage  ____________________________________________  RADIOLOGY  Pertinent labs & imaging results that were available during my care of the patient were reviewed by me and considered in my medical decision making (see chart for details). If possible, patient and/or family made aware of any abnormal findings.  Dg Chest 2 View  Result Date: 07/07/2017 CLINICAL DATA:  Left lower leg erythema. EXAM: CHEST - 2 VIEW COMPARISON:  Radiographs of April 11, 2017. FINDINGS: The heart size and mediastinal contours are within normal limits. Both lungs are clear. No pneumothorax or pleural effusion is noted. Left internal jugular Port-A-Cath is unchanged with distal tip in expected position of SVC. The visualized skeletal structures are unremarkable. IMPRESSION: No active cardiopulmonary disease. Electronically Signed   By: Marijo Conception, M.D.   On: 07/07/2017 16:23   ____________________________________________    PROCEDURES  Procedure(s) performed: None  Procedures  Critical Care performed: None  ____________________________________________   INITIAL IMPRESSION / ASSESSMENT AND PLAN / ED COURSE  Pertinent labs & imaging results that were available during my care of the patient were reviewed by me and considered in my medical decision making (see chart for details).  Patient here with evidence of progressive cellulitis which will likely need to be treated in the hospital given patient's deconditioned state.  She has had to have this before.  In addition, family are concerned that she may have a recurrent urinary tract infection because she is acting somewhat vaguely unwell in addition to the cellulitis.  She "does not seem right".  In any event, she is not altered in any way, lungs  are clear, will obtain urinalysis and urine culture as well as blood cultures given progressive  cellulitis and give antibiotics and admit    ____________________________________________   FINAL CLINICAL IMPRESSION(S) / ED DIAGNOSES  Final diagnoses:  None      This chart was dictated using voice recognition software.  Despite best efforts to proofread,  errors can occur which can change meaning.      Schuyler Amor, MD 07/07/17 [phone]

## 2017-07-07 NOTE — ED Triage Notes (Signed)
C/O left leg redness and swelling x 2-3 days.  Worse today.  Patient has history of cellulitis and sepsis.

## 2017-07-08 ENCOUNTER — Ambulatory Visit: Payer: Self-pay | Admitting: Family Medicine

## 2017-07-08 ENCOUNTER — Other Ambulatory Visit: Payer: Self-pay | Admitting: Family Medicine

## 2017-07-08 DIAGNOSIS — G35 Multiple sclerosis: Secondary | ICD-10-CM | POA: Diagnosis not present

## 2017-07-08 DIAGNOSIS — L03116 Cellulitis of left lower limb: Secondary | ICD-10-CM | POA: Diagnosis not present

## 2017-07-08 DIAGNOSIS — I10 Essential (primary) hypertension: Secondary | ICD-10-CM | POA: Diagnosis not present

## 2017-07-08 DIAGNOSIS — L0390 Cellulitis, unspecified: Secondary | ICD-10-CM | POA: Diagnosis not present

## 2017-07-08 LAB — CBC
HCT: 32.2 % — ABNORMAL LOW (ref 35.0–47.0)
Hemoglobin: 10.7 g/dL — ABNORMAL LOW (ref 12.0–16.0)
MCH: 29.4 pg (ref 26.0–34.0)
MCHC: 33 g/dL (ref 32.0–36.0)
MCV: 88.9 fL (ref 80.0–100.0)
Platelets: 230 10*3/uL (ref 150–440)
RBC: 3.63 MIL/uL — ABNORMAL LOW (ref 3.80–5.20)
RDW: 15.1 % — ABNORMAL HIGH (ref 11.5–14.5)
WBC: 5.6 10*3/uL (ref 3.6–11.0)

## 2017-07-08 LAB — BASIC METABOLIC PANEL
Anion gap: 9 (ref 5–15)
BUN: 22 mg/dL — ABNORMAL HIGH (ref 6–20)
CO2: 23 mmol/L (ref 22–32)
Calcium: 8.8 mg/dL — ABNORMAL LOW (ref 8.9–10.3)
Chloride: 103 mmol/L (ref 101–111)
Creatinine, Ser: 1.46 mg/dL — ABNORMAL HIGH (ref 0.44–1.00)
GFR calc Af Amer: 42 mL/min — ABNORMAL LOW (ref >60)
GFR calc non Af Amer: 36 mL/min — ABNORMAL LOW (ref >60)
Glucose, Bld: 103 mg/dL — ABNORMAL HIGH (ref 65–99)
Potassium: 3.5 mmol/L (ref 3.5–5.1)
Sodium: 135 mmol/L (ref 135–145)

## 2017-07-08 MED ORDER — CLINDAMYCIN HCL 300 MG PO CAPS: 300.0000 mg | ORAL_CAPSULE | Freq: Three times a day (TID) | ORAL | 0 refills | Status: DC

## 2017-07-08 MED ORDER — METHYLPHENIDATE HCL 10 MG PO TABS: 10.0000 mg | ORAL_TABLET | Freq: Two times a day (BID) | ORAL | Status: DC

## 2017-07-08 NOTE — Care Management Note (Signed)
Case Management Note  Patient Details  Name: [name] MRN: [id number] Date of Birth: March 06, 1951   Patient admitted with cellulitis. Patient lives at home with husband.  Patient has rollator, BSC, shower seat. PCP lada. Patient open with [facility] . Resumption orders for RN, PT, and aide.  RNCM signing off.   Subjective/Objective:                    Action/Plan:   Expected Discharge Date:  07/08/17               Expected Discharge Plan:  [place]  In-House Referral:     Discharge planning Services  CM Consult  Post Acute Care Choice:  Home Health, Resumption of Svcs/PTA Provider Choice offered to:     DME Arranged:    DME Agency:     HH Arranged:  RN, PT, Nurse's Aide [place] Agency:  [facility]  Status of Service:  Completed, signed off  If discussed at [place] of Stay Meetings, dates discussed:    Additional Comments:  Beverly Sessions, RN 07/08/2017, 11:12 AM

## 2017-07-08 NOTE — Discharge Summary (Signed)
[facility] NAME: [name]    MR#:  [id number]  DATE OF BIRTH:  08-16-1950  DATE OF ADMISSION:  07/07/2017 ADMITTING PHYSICIAN: Dustin Flock, MD  DATE OF DISCHARGE: 07/08/2017  PRIMARY CARE PHYSICIAN: Arnetha Courser, MD    ADMISSION DIAGNOSIS:  Cellulitis of left lower extremity [X21.194]  DISCHARGE DIAGNOSIS:  Active Problems:   Cellulitis   SECONDARY DIAGNOSIS:   Past Medical History:  Diagnosis Date  . Abdominal aortic atherosclerosis ([place]) 11/11/2016  . ADHD   . Anxiety   . COPD (chronic obstructive pulmonary disease) ([place])   . Depression    major depressive  . Dyspnea    doe  . Edema    left leg  . Follicular lymphoma ([place])    B Cell  . Follicular lymphoma grade II ([place])   . Hypertension   . Hypotension    idiopathic  . Kyphoscoliosis and scoliosis 11/26/2011  . Morbid obesity ([place]) 01/05/2016  . Multiple sclerosis ([place])   . Multiple sclerosis ([place])    1980's  . Neuromuscular disorder ([place])   . Obstructive and reflux uropathy    foley  . Pain    atypical facial  . Peripheral vascular disease of lower extremity with ulceration ([place]) 11/08/2015  . Skin ulcer ([place]) 11/08/2015  . Weakness    generalized. has MS    HOSPITAL COURSE:  67 year old female with history of MS and anxiety who presents with lower extremity erythema.  1. Left lower extremity cellulitis: Patient's erythema has improved. Patient was on vancomycin and Unasyn. She will be discharged on oral clindamycin. She will have follow up on Friday with her PCP. Erythema is very minimal upon discharge. There is no edema. And she is not complaining of pain.  2. MS: Patient will continue outpatient regimen  3. Essential hypertension: Patient will resume outpatient medications.  4. Acute on chronic kidney disease stage III: Creatinine remains at baseline  5. Anxiety and depression: Patient will continue on Wellbutrin, Cymbalta, Klonopin and  Seroquel   DISCHARGE CONDITIONS AND DIET:   Stable for discharge on heart healthy diet  CONSULTS OBTAINED:    DRUG ALLERGIES:  No Known Allergies  DISCHARGE MEDICATIONS:   Allergies as of 07/08/2017   No Known Allergies     Medication List    STOP taking these medications   ciprofloxacin 250 MG tablet Commonly known as:  CIPRO   methocarbamol 500 MG tablet Commonly known as:  ROBAXIN   potassium chloride 10 MEQ tablet Commonly known as:  K-DUR     TAKE these medications   BIOTIN PO Take by mouth daily. Pt taking 500 mcg daily   buPROPion 300 MG 24 hr tablet Commonly known as:  WELLBUTRIN XL Take 300 mg by mouth every morning.   clindamycin 300 MG capsule Commonly known as:  CLEOCIN Take 1 capsule (300 mg total) by mouth 3 (three) times daily for 5 days.   clonazePAM 1 MG tablet Commonly known as:  KLONOPIN Take 1 tablet (1 mg total) by mouth at bedtime.   DULoxetine 60 MG capsule Commonly known as:  CYMBALTA Take 1 capsule (60 mg total) by mouth every morning.   gabapentin 600 MG tablet Commonly known as:  NEURONTIN Take 1 tablet (600 mg total) by mouth 3 (three) times daily.   HUGGIES PULL-UPS Misc 1 each by Does not apply route 2 (two) times daily. Dx urinary incontinence, MS; LON 99 months   hydrALAZINE 25 MG  tablet Commonly known as:  APRESOLINE Take 2 tablets (50 mg total) by mouth 3 (three) times daily.   hydrochlorothiazide 25 MG tablet Commonly known as:  HYDRODIURIL Take 0.5 tablets (12.5 mg total) by mouth daily.   interferon beta-1a 30 MCG/0.5ML Pskt injection Commonly known as:  AVONEX Inject 30 mcg into the muscle every Monday.   lactobacillus acidophilus & bulgar chewable tablet Chew 1 tablet by mouth 3 (three) times daily with meals.   lidocaine-prilocaine cream Commonly known as:  EMLA Apply 1 application topically as needed. apply cream over the port site and then place small piece of saran wrap over the cream to protect your  clothing   lisinopril 10 MG tablet Commonly known as:  PRINIVIL,ZESTRIL Take 2 tablets (20 mg total) by mouth daily.   methylphenidate 10 MG tablet Commonly known as:  RITALIN Take 1 tablet (10 mg total) by mouth 2 (two) times daily. What changed:  additional instructions   multivitamin with minerals Tabs tablet Take 1 tablet by mouth every evening.   polyethylene glycol powder powder Commonly known as:  GLYCOLAX/MIRALAX Take 17 g by mouth daily as needed for mild constipation. Mixed in water What changed:  additional instructions   QUEtiapine Fumarate 150 MG 24 hr tablet Commonly known as:  SEROQUEL XR Take 150 mg by mouth at bedtime.   tiZANidine 4 MG tablet Commonly known as:  ZANAFLEX Take 2 tablets by mouth 2 (two) times daily.   topiramate 50 MG tablet Commonly known as:  TOPAMAX Take 1 tablet (50 mg total) by mouth daily. What changed:  when to take this   vitamin C 500 MG tablet Commonly known as:  ASCORBIC ACID Take 500 mg by mouth every evening.         Today   CHIEF COMPLAINT:  Doing well this morning. Erythema has improved   VITAL SIGNS:  Blood pressure (!) 114/52, pulse 60, temperature 97.7 F (36.5 C), temperature source Oral, resp. rate 16, height 5\' 4"  (1.626 m), weight 135.8 kg (299 lb 6.4 oz), SpO2 97 %.   REVIEW OF SYSTEMS:  Review of Systems  Constitutional: Negative.  Negative for chills, fever and malaise/fatigue.  HENT: Negative.  Negative for ear discharge, ear pain, hearing loss, nosebleeds and sore throat.   Eyes: Negative.  Negative for blurred vision and pain.  Respiratory: Negative.  Negative for cough, hemoptysis, shortness of breath and wheezing.   Cardiovascular: Negative.  Negative for chest pain, palpitations and leg swelling.  Gastrointestinal: Negative.  Negative for abdominal pain, blood in stool, diarrhea, nausea and vomiting.  Genitourinary: Negative.  Negative for dysuria.  Musculoskeletal: Negative.  Negative for  back pain.  Skin:       Left LE redness better  Neurological: Negative for dizziness, tremors, focal weakness, seizures and headaches.  Endo/Heme/Allergies: Negative.  Does not bruise/bleed easily.  Psychiatric/Behavioral: Negative.  Negative for depression, hallucinations and suicidal ideas.     PHYSICAL EXAMINATION:  GENERAL:  67 y.o.-year-old patient lying in the bed with no acute distress.  obese NECK:  Supple, no jugular venous distention. No thyroid enlargement, no tenderness.  LUNGS: Normal breath sounds bilaterally, no wheezing, rales,rhonchi  No use of accessory muscles of respiration.  CARDIOVASCULAR: S1, S2 normal. No murmurs, rubs, or gallops.  ABDOMEN: Soft, non-tender, non-distended. Bowel sounds present. No organomegaly or mass.  EXTREMITIES: No pedal edema, cyanosis, or clubbing.  PSYCHIATRIC: The patient is alert and oriented x 3.  SKIN: cellulitis has improved minimal erythema left LE  DATA REVIEW:   CBC Recent Labs  Lab 07/08/17 0449  WBC 5.6  HGB 10.7*  HCT 32.2*  PLT 230    Chemistries  Recent Labs  Lab 07/07/17 1518  07/08/17 0449  NA 132*   < > 135  K 3.7   < > 3.5  CL 97*   < > 103  CO2 25   < > 23  GLUCOSE 100*   < > 103*  BUN 22*   < > 22*  CREATININE 1.63*   < > 1.46*  CALCIUM 9.4   < > 8.8*  AST 25  --   --   ALT 20  --   --   ALKPHOS 72  --   --   BILITOT 0.7  --   --    < > = values in this interval not displayed.    Cardiac Enzymes No results for input(s): TROPONINI in the last 168 hours.  Microbiology Results  @MICRORSLT48 @  RADIOLOGY:  Dg Chest 2 View  Result Date: 07/07/2017 CLINICAL DATA:  Left lower leg erythema. EXAM: CHEST - 2 VIEW COMPARISON:  Radiographs of April 11, 2017. FINDINGS: The heart size and mediastinal contours are within normal limits. Both lungs are clear. No pneumothorax or pleural effusion is noted. Left internal jugular Port-A-Cath is unchanged with distal tip in expected position of SVC. The  visualized skeletal structures are unremarkable. IMPRESSION: No active cardiopulmonary disease. Electronically Signed   By: Marijo Conception, M.D.   On: 07/07/2017 16:23      Allergies as of 07/08/2017   No Known Allergies     Medication List    STOP taking these medications   ciprofloxacin 250 MG tablet Commonly known as:  CIPRO   methocarbamol 500 MG tablet Commonly known as:  ROBAXIN   potassium chloride 10 MEQ tablet Commonly known as:  K-DUR     TAKE these medications   BIOTIN PO Take by mouth daily. Pt taking 500 mcg daily   buPROPion 300 MG 24 hr tablet Commonly known as:  WELLBUTRIN XL Take 300 mg by mouth every morning.   clindamycin 300 MG capsule Commonly known as:  CLEOCIN Take 1 capsule (300 mg total) by mouth 3 (three) times daily for 5 days.   clonazePAM 1 MG tablet Commonly known as:  KLONOPIN Take 1 tablet (1 mg total) by mouth at bedtime.   DULoxetine 60 MG capsule Commonly known as:  CYMBALTA Take 1 capsule (60 mg total) by mouth every morning.   gabapentin 600 MG tablet Commonly known as:  NEURONTIN Take 1 tablet (600 mg total) by mouth 3 (three) times daily.   HUGGIES PULL-UPS Misc 1 each by Does not apply route 2 (two) times daily. Dx urinary incontinence, MS; LON 99 months   hydrALAZINE 25 MG tablet Commonly known as:  APRESOLINE Take 2 tablets (50 mg total) by mouth 3 (three) times daily.   hydrochlorothiazide 25 MG tablet Commonly known as:  HYDRODIURIL Take 0.5 tablets (12.5 mg total) by mouth daily.   interferon beta-1a 30 MCG/0.5ML Pskt injection Commonly known as:  AVONEX Inject 30 mcg into the muscle every Monday.   lactobacillus acidophilus & bulgar chewable tablet Chew 1 tablet by mouth 3 (three) times daily with meals.   lidocaine-prilocaine cream Commonly known as:  EMLA Apply 1 application topically as needed. apply cream over the port site and then place small piece of saran wrap over the cream to protect your  clothing   lisinopril  10 MG tablet Commonly known as:  PRINIVIL,ZESTRIL Take 2 tablets (20 mg total) by mouth daily.   methylphenidate 10 MG tablet Commonly known as:  RITALIN Take 1 tablet (10 mg total) by mouth 2 (two) times daily. What changed:  additional instructions   multivitamin with minerals Tabs tablet Take 1 tablet by mouth every evening.   polyethylene glycol powder powder Commonly known as:  GLYCOLAX/MIRALAX Take 17 g by mouth daily as needed for mild constipation. Mixed in water What changed:  additional instructions   QUEtiapine Fumarate 150 MG 24 hr tablet Commonly known as:  SEROQUEL XR Take 150 mg by mouth at bedtime.   tiZANidine 4 MG tablet Commonly known as:  ZANAFLEX Take 2 tablets by mouth 2 (two) times daily.   topiramate 50 MG tablet Commonly known as:  TOPAMAX Take 1 tablet (50 mg total) by mouth daily. What changed:  when to take this   vitamin C 500 MG tablet Commonly known as:  ASCORBIC ACID Take 500 mg by mouth every evening.          Management plans discussed with the patient and she is in agreement. Stable for discharge home  Patient should follow up with pcp  CODE STATUS:     Code Status Orders  (From admission, onward)        Start     Ordered   07/07/17 2000  Full code  Continuous     07/07/17 2000    Code Status History    Date Active Date Inactive Code Status Order ID Comments User Context   05/14/2017 01:52 05/15/2017 19:53 Full Code [id number]  Lance Coon, MD Inpatient   04/01/2017 17:23 04/05/2017 17:42 Full Code [id number]  Daylene Posey, RN Inpatient   02/15/2017 17:15 02/17/2017 20:08 Full Code [id number]  Henreitta Leber, MD Inpatient   02/06/2017 23:56 02/09/2017 18:58 Full Code [id number]  Dustin Flock, MD Inpatient   01/02/2017 19:03 01/06/2017 18:48 Full Code [id number]  [name], MD ED   12/29/2016 12:52 12/30/2016 17:22 Full Code [id number]  Nicholes Mango, MD Inpatient   10/11/2016 23:20 10/14/2016 17:26  Full Code [id number]  Theodoro Grist, MD Inpatient   08/28/2016 11:59 08/30/2016 19:32 Full Code [id number]  Loletha Grayer, MD ED   12/18/2015 19:56 12/19/2015 19:39 Full Code [id number]  Hower, Aaron Mose, MD ED      TOTAL TIME TAKING CARE OF THIS PATIENT: 38 minutes.    Note: This dictation was prepared with Dragon dictation along with smaller phrase technology. Any transcriptional errors that result from this process are unintentional.  Matti Killingsworth M.D on 07/08/2017 at 9:39 AM  Between 7am to 6pm - Pager - [phone] After 6pm go to [email or web address] - password [email or web address] [facility]  Office  [phone]  CC: Primary care physician; Arnetha Courser, MD

## 2017-07-08 NOTE — Care Management Important Message (Signed)
Important Message  Patient Details  Name: [name] MRN: [id number] Date of Birth: 20-Jul-1950   Medicare Important Message Given:  N/A - LOS <3 / Initial given by admissions    Beverly Sessions, RN 07/08/2017, 11:13 AM

## 2017-07-08 NOTE — Progress Notes (Signed)
[name]  A and O x 4. VSS. Pt tolerating diet well. No complaints of pain or nausea. IV removed intact, prescriptions given. Pt voiced understanding of discharge instructions with no further questions. Pt discharged via wheelchair with axillary.    Allergies as of 07/08/2017   No Known Allergies     Medication List    STOP taking these medications   ciprofloxacin 250 MG tablet Commonly known as:  CIPRO   methocarbamol 500 MG tablet Commonly known as:  ROBAXIN   potassium chloride 10 MEQ tablet Commonly known as:  K-DUR     TAKE these medications   BIOTIN PO Take by mouth daily. Pt taking 500 mcg daily   buPROPion 300 MG 24 hr tablet Commonly known as:  WELLBUTRIN XL Take 300 mg by mouth every morning.   clindamycin 300 MG capsule Commonly known as:  CLEOCIN Take 1 capsule (300 mg total) by mouth 3 (three) times daily for 5 days.   clonazePAM 1 MG tablet Commonly known as:  KLONOPIN Take 1 tablet (1 mg total) by mouth at bedtime.   DULoxetine 60 MG capsule Commonly known as:  CYMBALTA Take 1 capsule (60 mg total) by mouth every morning.   gabapentin 600 MG tablet Commonly known as:  NEURONTIN Take 1 tablet (600 mg total) by mouth 3 (three) times daily.   HUGGIES PULL-UPS Misc 1 each by Does not apply route 2 (two) times daily. Dx urinary incontinence, MS; LON 99 months   hydrALAZINE 25 MG tablet Commonly known as:  APRESOLINE Take 2 tablets (50 mg total) by mouth 3 (three) times daily.   hydrochlorothiazide 25 MG tablet Commonly known as:  HYDRODIURIL Take 0.5 tablets (12.5 mg total) by mouth daily.   interferon beta-1a 30 MCG/0.5ML Pskt injection Commonly known as:  AVONEX Inject 30 mcg into the muscle every Monday.   lactobacillus acidophilus & bulgar chewable tablet Chew 1 tablet by mouth 3 (three) times daily with meals.   lidocaine-prilocaine cream Commonly known as:  EMLA Apply 1 application topically as needed. apply cream over the port site  and then place small piece of saran wrap over the cream to protect your clothing   lisinopril 10 MG tablet Commonly known as:  PRINIVIL,ZESTRIL Take 2 tablets (20 mg total) by mouth daily.   methylphenidate 10 MG tablet Commonly known as:  RITALIN Take 1 tablet (10 mg total) by mouth 2 (two) times daily. What changed:  additional instructions   multivitamin with minerals Tabs tablet Take 1 tablet by mouth every evening.   polyethylene glycol powder powder Commonly known as:  GLYCOLAX/MIRALAX Take 17 g by mouth daily as needed for mild constipation. Mixed in water What changed:  additional instructions   QUEtiapine Fumarate 150 MG 24 hr tablet Commonly known as:  SEROQUEL XR Take 150 mg by mouth at bedtime.   tiZANidine 4 MG tablet Commonly known as:  ZANAFLEX Take 2 tablets by mouth 2 (two) times daily.   topiramate 50 MG tablet Commonly known as:  TOPAMAX Take 1 tablet (50 mg total) by mouth daily. What changed:  when to take this   vitamin C 500 MG tablet Commonly known as:  ASCORBIC ACID Take 500 mg by mouth every evening.       Vitals:   07/08/17 0547 07/08/17 1306  BP: (!) 114/52 (!) 118/59  Pulse: 60 61  Resp: 16 17  Temp: 97.7 F (36.5 C) 97.9 F (36.6 C)  SpO2: 97% 98%  Francesco Sor

## 2017-07-08 NOTE — Progress Notes (Signed)
PT Cancellation Note  Patient Details Name: [name] MRN: [id number] DOB: 10-17-1950   Cancelled Treatment:    Reason Eval/Treat Not Completed: Other (comment). Consult received and chart reviewed. Pt admitted yesterday with discharge pending this date. Per patient, she has HHPT services in place at home and has no acute needs at this time. Reports she has been ambulatory short distances, which is her baseline. Will cancel current order and defer to HHPT to continue to address deficits. Discussed with MD and CSW.   Rosaleigh Brazzel 07/08/2017, 10:16 AM Greggory Stallion, PT, DPT [phone]'

## 2017-07-09 LAB — URINE CULTURE

## 2017-07-10 ENCOUNTER — Inpatient Hospital Stay: Payer: Self-pay | Admitting: Family Medicine

## 2017-07-10 ENCOUNTER — Telehealth: Payer: Self-pay | Admitting: Family Medicine

## 2017-07-10 NOTE — Telephone Encounter (Signed)
Called Lana, no answer. LM for her giving her verbal OK for patient's PT orders.

## 2017-07-10 NOTE — Telephone Encounter (Signed)
Ok to provide additional orders? Thanks

## 2017-07-10 NOTE — Telephone Encounter (Signed)
That's fine, thank you  

## 2017-07-10 NOTE — Telephone Encounter (Signed)
Copied from [place]. Topic: Quick Communication - See Telephone Encounter >> Jul 10, 2017 11:10 AM Ahmed Prima L wrote: CRM for notification. See Telephone encounter for:   07/10/17. Lana from [facility] called and needs verbals for skilled cared nursing visitations.  Call back is [phone] you can leave message if no answer

## 2017-07-12 LAB — CULTURE, BLOOD (ROUTINE X 2)
Culture: NO GROWTH
Culture: NO GROWTH
Special Requests: ADEQUATE
Special Requests: ADEQUATE

## 2017-07-14 ENCOUNTER — Telehealth: Payer: Self-pay

## 2017-07-14 NOTE — Telephone Encounter (Signed)
Called left voicemail with Lana from [facility].  Dr Sanda Klein wanted to know if they were doing wound care on pt, because she keeps getting paperwork from a [facility] and wanted to know if this is legit.

## 2017-07-16 ENCOUNTER — Telehealth: Payer: Self-pay

## 2017-07-16 NOTE — Telephone Encounter (Signed)
[name] and these were orders from home health [name]

## 2017-07-16 NOTE — Telephone Encounter (Signed)
That's fine.  Thank you.

## 2017-07-16 NOTE — Telephone Encounter (Signed)
Verbal voicemail left  Copied from [place] [phone]. Topic: Quick Communication - See Telephone Encounter >> Jul 16, 2017 12:23 PM Antonieta Iba C wrote: CRM for notification. See Telephone encounter for: Colletta [place] with [facility] called in to get verbal orders to complete an OT eval with pt. CB: [phone]   07/16/17.

## 2017-07-24 ENCOUNTER — Ambulatory Visit (INDEPENDENT_AMBULATORY_CARE_PROVIDER_SITE_OTHER): Payer: PPO | Admitting: General Surgery

## 2017-07-24 ENCOUNTER — Encounter: Payer: Self-pay | Admitting: General Surgery

## 2017-07-24 VITALS — BP 152/72 | HR 75 | Resp 18 | Ht 64.0 in | Wt 307.0 lb

## 2017-07-24 DIAGNOSIS — C8295 Follicular lymphoma, unspecified, lymph nodes of inguinal region and lower limb: Secondary | ICD-10-CM | POA: Diagnosis not present

## 2017-07-24 DIAGNOSIS — R32 Unspecified urinary incontinence: Secondary | ICD-10-CM | POA: Diagnosis not present

## 2017-07-24 NOTE — Progress Notes (Signed)
Patient ID: [name], female   DOB: 02/27/1951, 67 y.o.   MRN: [id number]  Chief Complaint  Patient presents with  . Procedure    HPI [name] is a 67 y.o. female here today for a port removal, former patient of Dr Jamal Collin.   She comes in today in a wheelchair, but usually walks with a walker.  HPI  Past Medical History:  Diagnosis Date  . Abdominal aortic atherosclerosis ([place]) 11/11/2016  . ADHD   . Anxiety   . COPD (chronic obstructive pulmonary disease) ([place])   . Depression    major depressive  . Dyspnea    doe  . Edema    left leg  . Follicular lymphoma ([place])    B Cell  . Follicular lymphoma grade II ([place])   . Hypertension   . Hypotension    idiopathic  . Kyphoscoliosis and scoliosis 11/26/2011  . Morbid obesity ([place]) 01/05/2016  . Multiple sclerosis ([place])   . Multiple sclerosis ([place])    1980's  . Neuromuscular disorder ([place])   . Obstructive and reflux uropathy    foley  . Pain    atypical facial  . Peripheral vascular disease of lower extremity with ulceration ([place]) 11/08/2015  . Skin ulcer ([place]) 11/08/2015  . Weakness    generalized. has MS    Past Surgical History:  Procedure Laterality Date  . BACK SURGERY N/A 2002  . CYST EXCISION     lower back  . INGUINAL LYMPH NODE BIOPSY Left 07/04/2016   Procedure: INGUINAL LYMPH NODE BIOPSY;  Surgeon: Christene Lye, MD;  Location: [facility] ORS;  Service: General;  Laterality: Left;  . PORTACATH PLACEMENT N/A 07/22/2016   Procedure: INSERTION PORT-A-CATH;  Surgeon: Christene Lye, MD;  Location: [facility] ORS;  Service: General;  Laterality: N/A;  . TONSILLECTOMY AND ADENOIDECTOMY    . TUBAL LIGATION      Family History  Problem Relation Age of Onset  . COPD Mother   . Diabetes Mother   . Heart failure Mother   . Alcohol abuse Father   . Kidney disease Father   . Kidney failure Father   . Arthritis Sister   . CAD Maternal Grandmother   . Stroke Maternal Grandfather   . Arthritis Sister   .  Mental illness Sister   . Arthritis Brother     Social History Social History   Tobacco Use  . Smoking status: Former Smoker    Packs/day: 1.00    Years: 20.00    Pack years: 20.00    Types: Cigarettes    Start date: 04/30/1995    Last attempt to quit: 02/03/2016    Years since quitting: 1.4  . Smokeless tobacco: Never Used  Substance Use Topics  . Alcohol use: No    Alcohol/week: 0.0 oz  . Drug use: Yes    Types: Marijuana    Comment: smokes THC occasionally per pt     No Known Allergies  Current Outpatient Medications  Medication Sig Dispense Refill  . BIOTIN PO Take by mouth daily. Pt taking 500 mcg daily    . buPROPion (WELLBUTRIN XL) 300 MG 24 hr tablet Take 300 mg by mouth every morning.     . clonazePAM (KLONOPIN) 1 MG tablet Take 1 tablet (1 mg total) by mouth at bedtime. 5 tablet 0  . Diapers & Supplies (HUGGIES PULL-UPS) MISC 1 each by Does not apply route 2 (two) times daily. Dx urinary incontinence, MS; LON 99 months 60  each 11  . DULoxetine (CYMBALTA) 60 MG capsule Take 1 capsule (60 mg total) by mouth every morning. 30 capsule 3  . gabapentin (NEURONTIN) 600 MG tablet Take 1 tablet (600 mg total) by mouth 3 (three) times daily.    . hydrALAZINE (APRESOLINE) 25 MG tablet Take 2 tablets (50 mg total) by mouth 3 (three) times daily. 180 tablet 1  . hydrochlorothiazide (HYDRODIURIL) 25 MG tablet Take 0.5 tablets (12.5 mg total) by mouth daily. 15 tablet 0  . interferon beta-1a (AVONEX) 30 MCG/0.5ML PSKT injection Inject 30 mcg into the muscle every Monday.    . lactobacillus acidophilus & bulgar (LACTINEX) chewable tablet Chew 1 tablet by mouth 3 (three) times daily with meals. 15 tablet 0  . lisinopril (PRINIVIL,ZESTRIL) 10 MG tablet Take 2 tablets (20 mg total) by mouth daily.    . methylphenidate (RITALIN) 10 MG tablet Take 1 tablet (10 mg total) by mouth 2 (two) times daily.    . Multiple Vitamin (MULTIVITAMIN WITH MINERALS) TABS tablet Take 1 tablet by mouth every  evening.     . polyethylene glycol powder (GLYCOLAX/MIRALAX) powder Take 17 g by mouth daily as needed for mild constipation. Mixed in water (Patient taking differently: Take 17 g by mouth daily as needed for mild constipation. For constipation)    . QUEtiapine Fumarate (SEROQUEL XR) 150 MG 24 hr tablet Take 150 mg by mouth at bedtime.   4  . tiZANidine (ZANAFLEX) 4 MG tablet Take 2 tablets by mouth 2 (two) times daily.  0  . topiramate (TOPAMAX) 50 MG tablet Take 1 tablet (50 mg total) by mouth daily. (Patient taking differently: Take 50 mg by mouth daily with lunch. ) 30 tablet 0  . vitamin C (ASCORBIC ACID) 500 MG tablet Take 500 mg by mouth every evening.     . lidocaine-prilocaine (EMLA) cream Apply 1 application topically as needed. apply cream over the port site and then place small piece of saran wrap over the cream to protect your clothing (Patient not taking: Reported on 07/24/2017) 30 g 1   No current facility-administered medications for this visit.    Facility-Administered Medications Ordered in Other Visits  Medication Dose Route Frequency Provider Last Rate Last Dose  . heparin lock flush 100 unit/mL  500 Units Intracatheter Once PRN Sindy Guadeloupe, MD        Review of Systems Review of Systems  Constitutional: Negative.   Respiratory: Positive for shortness of breath.   Cardiovascular: Negative.     Blood pressure (!) 152/72, pulse 75, resp. rate 18, height 5\' 4"  (1.626 m), weight (!) 307 lb (139.3 kg), SpO2 99 %.  Physical Exam Physical Exam  Constitutional: She is oriented to person, place, and time. She appears well-developed and well-nourished.  Pulmonary/Chest:    Neurological: She is alert and oriented to person, place, and time.  Skin: Skin is warm and dry.  Psychiatric: Her behavior is normal.    Data Reviewed Port removal procedure was reviewed and she was amenable to proceed.  The skin was cleansed with alcohol followed by 10 cc of 0.5% Xylocaine with  0.25% Marcaine with 1-200,000 units of epinephrine.  The area was cleaned with ChloraPrep and draped.  An additional 1 cc of 1% plain Xylocaine was used.  The original incision was opened and the port exposed.  3 transfixing sutures of Prolene were removed.  The port was removed with an intact catheter tip.  No bleeding was noted.  The deep tissue was  approximated with a running 3-0 Vicryl suture.  The skin was closed with a running 3-0 Vicryl subcuticular suture.  Benzoin and Steri-Strip applied.  Telfa and Tegaderm dressing.  Ice pack provided.  Assessment    Candidate for port removal.    Plan    Postoperative wound care reviewed. Follow up as needed. The patient is aware to call back for any questions or new concerns.      HPI, Physical Exam, Assessment and Plan have been scribed under the direction and in the presence of Robert Bellow, MD. [name] Fetch, RN  I have completed the exam and reviewed the above documentation for accuracy and completeness.  I agree with the above.  Haematologist has been used and any errors in dictation or transcription are unintentional.  Hervey Ard, M.D., F.A.C.S.   [name] 07/25/2017, 4:36 PM

## 2017-07-24 NOTE — Patient Instructions (Addendum)
The patient is aware to call back for any questions or concerns. May shower May remove dressing in 2-3 days Steri strips will gradually come off over 2-3 weeks May use an Ice pack as needed for comfort  

## 2017-07-28 ENCOUNTER — Telehealth: Payer: Self-pay | Admitting: Family Medicine

## 2017-07-28 DIAGNOSIS — L03116 Cellulitis of left lower limb: Secondary | ICD-10-CM

## 2017-07-28 DIAGNOSIS — L03119 Cellulitis of unspecified part of limb: Secondary | ICD-10-CM

## 2017-07-28 DIAGNOSIS — N390 Urinary tract infection, site not specified: Secondary | ICD-10-CM

## 2017-07-28 NOTE — Telephone Encounter (Signed)
Patient needs to see infectious disease for repeated episodes of cellulitis and UTIs I put the referral in on February 27th Please make that happen I called patient's number, left message that if she is having fever, concern for urinary tract infection or cellulitis again, go to the hospital as she has done before; I explained that I really want her to see the ID specialist about these infections Staff, please let home health know to check a urine with micro, dip, and culture and get a CBC and BMP for me please (if she is not in the  ER or hospital by Tuesday) Patient needs an appt with me; I've not seen her since December 2018 She canceled 3 appointments with me in January and then no showed in February I need to see her or we'll have to ask her to find another provider please

## 2017-07-28 NOTE — Telephone Encounter (Signed)
Called caregiver back she states has redness going up back of right leg.  Her temp is 99.1, and is also having bladder spasms. Caregiver says that [facility] stopped coming due to patients ins. Being switched to [facility] and they no longer cover.  So they will need new referral also

## 2017-07-28 NOTE — Telephone Encounter (Signed)
Copied from [place] [id number]. Topic: Quick Communication - See Telephone Encounter >> Jul 28, 2017  2:28 PM Hewitt Shorts wrote: CRM for notification. See Telephone encounter for: 07/28/17. Pt is calling to talk with someone regarding her redness in her legs and that [facility] has stopped coming and it looks like her urine has an infection in it  Best number [phone]

## 2017-07-29 NOTE — Telephone Encounter (Signed)
Called and informed pt. Pt verbalized understanding and appointment with Dr.Lada was scheduled for 08/07/2017. Also notified friend and pt that if she was running a fever along with having problems with cellulitis and UTIs she needed to go to the ER. Home health and ID referral ordered.

## 2017-08-07 ENCOUNTER — Ambulatory Visit (INDEPENDENT_AMBULATORY_CARE_PROVIDER_SITE_OTHER): Payer: Medicare HMO | Admitting: Family Medicine

## 2017-08-07 ENCOUNTER — Encounter: Payer: Self-pay | Admitting: Family Medicine

## 2017-08-07 VITALS — BP 140/84 | HR 78 | Temp 98.5°F | Ht 64.0 in

## 2017-08-07 DIAGNOSIS — Z5181 Encounter for therapeutic drug level monitoring: Secondary | ICD-10-CM

## 2017-08-07 DIAGNOSIS — M7989 Other specified soft tissue disorders: Secondary | ICD-10-CM | POA: Diagnosis not present

## 2017-08-07 DIAGNOSIS — C8293 Follicular lymphoma, unspecified, intra-abdominal lymph nodes: Secondary | ICD-10-CM | POA: Diagnosis not present

## 2017-08-07 DIAGNOSIS — L03116 Cellulitis of left lower limb: Secondary | ICD-10-CM | POA: Diagnosis not present

## 2017-08-07 DIAGNOSIS — G35 Multiple sclerosis: Secondary | ICD-10-CM | POA: Diagnosis not present

## 2017-08-07 DIAGNOSIS — N319 Neuromuscular dysfunction of bladder, unspecified: Secondary | ICD-10-CM | POA: Diagnosis not present

## 2017-08-07 DIAGNOSIS — Z9181 History of falling: Secondary | ICD-10-CM

## 2017-08-07 DIAGNOSIS — N183 Chronic kidney disease, stage 3 unspecified: Secondary | ICD-10-CM

## 2017-08-07 DIAGNOSIS — Z1211 Encounter for screening for malignant neoplasm of colon: Secondary | ICD-10-CM | POA: Diagnosis not present

## 2017-08-07 DIAGNOSIS — L03119 Cellulitis of unspecified part of limb: Secondary | ICD-10-CM

## 2017-08-07 DIAGNOSIS — L98491 Non-pressure chronic ulcer of skin of other sites limited to breakdown of skin: Secondary | ICD-10-CM

## 2017-08-07 DIAGNOSIS — I10 Essential (primary) hypertension: Secondary | ICD-10-CM | POA: Diagnosis not present

## 2017-08-07 MED ORDER — FUROSEMIDE 20 MG PO TABS: 20.0000 mg | ORAL_TABLET | Freq: Every day | ORAL | 0 refills | Status: DC

## 2017-08-07 MED ORDER — HYDRALAZINE HCL 50 MG PO TABS: 50.0000 mg | ORAL_TABLET | Freq: Three times a day (TID) | ORAL | 5 refills | Status: DC

## 2017-08-07 MED ORDER — LISINOPRIL 20 MG PO TABS: 20.0000 mg | ORAL_TABLET | Freq: Every day | ORAL | 5 refills | Status: DC

## 2017-08-07 NOTE — Assessment & Plan Note (Signed)
Managed by oncologist 

## 2017-08-07 NOTE — Assessment & Plan Note (Signed)
Recurrent; going to see ID tomorrow

## 2017-08-07 NOTE — Progress Notes (Signed)
BP 140/84 (BP Location: Left Arm, Patient Position: Sitting, Cuff Size: Large)   Pulse 78   Temp 98.5 F (36.9 C) (Oral)   Ht 5\' 4"  (1.626 m)   SpO2 99%   BMI 52.70 kg/m    Subjective:    Patient ID: [name], female    DOB: Nov 19, 1950, 67 y.o.   MRN: [id number]  HPI: [name] is a 68 y.o. female  Chief Complaint  Patient presents with  . Follow-up  . Leg Pain    HPI  She is here for follow-up Going to see the ID specialist tomorrow about the recurrent cellulitis in the LEFT leg, sometimes right leg; recurrent UTIs She has indwelling Foley catheter; she sees Dr. Bernardo Heater (urologist) For her to get [facility] to come back out, needs new orders She needs help in the home: bathing, dressing Husband prepares meals Not using a wheelchair at home [facility] with walker at home She is essentially homebound; great difficulty to leave the home; gets out to doctor office really only outside the home She has lots of swelling in the legs; both legs; uses compression stockings at home and that helps some Currently has ulcers, weeping on the right shin She would be in agreement for a hospital bed HTN; lisinopril and hydralazine Goes to see Dr. Janese Banks on July 11th with labs too CKD, stage 3; not seeing kidney specialist  Depression screen [facility] 2/9 08/07/2017 04/23/2017 03/03/2017 02/04/2017 02/03/2017  Decreased Interest 0 1 1 1 1   Down, Depressed, Hopeless 0 3 1 1 3   PHQ - 2 Score 0 4 2 2 4   Altered sleeping - 1 0 0 1  Tired, decreased energy - 3 2 3 3   Change in appetite - 0 1 - 1  Feeling bad or failure about yourself  - 3 0 0 -  Trouble concentrating - - 0 0 1  Moving slowly or fidgety/restless - 2 0 1 0  Suicidal thoughts - 0 0 0 0  PHQ-9 Score - 13 5 6 10   Difficult doing work/chores - - Somewhat difficult Not difficult at all Somewhat difficult  Some recent data might be hidden    Relevant past medical, surgical, family and social history reviewed Past Medical History:    Diagnosis Date  . Abdominal aortic atherosclerosis ([place]) 11/11/2016  . ADHD   . Anxiety   . COPD (chronic obstructive pulmonary disease) ([place])   . Depression    major depressive  . Dyspnea    doe  . Edema    left leg  . Follicular lymphoma ([place])    B Cell  . Follicular lymphoma grade II ([place])   . Hypertension   . Hypotension    idiopathic  . Kyphoscoliosis and scoliosis 11/26/2011  . Morbid obesity ([place]) 01/05/2016  . Multiple sclerosis ([place])   . Multiple sclerosis ([place])    1980's  . Neuromuscular disorder ([place])   . Obstructive and reflux uropathy    foley  . Pain    atypical facial  . Peripheral vascular disease of lower extremity with ulceration ([place]) 11/08/2015  . Skin ulcer ([place]) 11/08/2015  . Weakness    generalized. has MS   Past Surgical History:  Procedure Laterality Date  . BACK SURGERY N/A 2002  . CYST EXCISION     lower back  . INGUINAL LYMPH NODE BIOPSY Left 07/04/2016   Procedure: INGUINAL LYMPH NODE BIOPSY;  Surgeon: Christene Lye, MD;  Location: [facility] ORS;  Service: General;  Laterality: Left;  . PORTACATH PLACEMENT N/A 07/22/2016   Procedure: INSERTION PORT-A-CATH;  Surgeon: Christene Lye, MD;  Location: [facility] ORS;  Service: General;  Laterality: N/A;  . TONSILLECTOMY AND ADENOIDECTOMY    . TUBAL LIGATION     Family History  Problem Relation Age of Onset  . COPD Mother   . Diabetes Mother   . Heart failure Mother   . Alcohol abuse Father   . Kidney disease Father   . Kidney failure Father   . Arthritis Sister   . CAD Maternal Grandmother   . Stroke Maternal Grandfather   . Arthritis Sister   . Mental illness Sister   . Arthritis Brother    Social History   Tobacco Use  . Smoking status: Former Smoker    Packs/day: 1.00    Years: 20.00    Pack years: 20.00    Types: Cigarettes    Start date: 04/30/1995    Last attempt to quit: 02/03/2016    Years since quitting: 1.5  . Smokeless tobacco: Never Used  Substance Use Topics  .  Alcohol use: No    Alcohol/week: 0.0 oz  . Drug use: Yes    Types: Marijuana    Comment: smokes THC occasionally per pt     Interim medical history since last visit reviewed. Allergies and medications reviewed  Review of Systems Per HPI unless specifically indicated above     Objective:    BP 140/84 (BP Location: Left Arm, Patient Position: Sitting, Cuff Size: Large)   Pulse 78   Temp 98.5 F (36.9 C) (Oral)   Ht 5\' 4"  (1.626 m)   SpO2 99%   BMI 52.70 kg/m   Wt Readings from Last 3 Encounters:  07/24/17 (!) 307 lb (139.3 kg)  07/07/17 299 lb 6.4 oz (135.8 kg)  06/13/17 (!) 307 lb 12.8 oz (139.6 kg)    Physical Exam  Constitutional: She appears well-developed and well-nourished. No distress.  HENT:  Head: Normocephalic and atraumatic.  Eyes: EOM are normal. No scleral icterus.  Neck: No thyromegaly present.  Cardiovascular: Normal rate, regular rhythm and normal heart sounds.  No murmur heard. Pulmonary/Chest: Effort normal and breath sounds normal. No respiratory distress. She has no wheezes.  Abdominal: Soft. Bowel sounds are normal. She exhibits no distension.  Musculoskeletal: She exhibits edema (2+ pitting edema).  Neurological: She is alert. She exhibits normal muscle tone.  Skin: Skin is warm and dry. She is not diaphoretic. No pallor.  Psychiatric: She has a normal mood and affect.    Results for orders placed or performed during the hospital encounter of 07/07/17  Urine culture  Result Value Ref Range   Specimen Description      URINE, RANDOM Performed at [facility], [address] [place], [place] [address]    Special Requests      NONE Performed at [facility], [address] [place], [place] [address]    Culture MULTIPLE SPECIES PRESENT, SUGGEST RECOLLECTION (A)    Report Status 07/09/2017 FINAL   Culture, blood (routine x 2)  Result Value Ref Range   Specimen Description BLOOD LAC    Special Requests      BOTTLES DRAWN  AEROBIC AND ANAEROBIC Blood Culture adequate volume   Culture      NO GROWTH 5 DAYS Performed at [facility], [address], [place], [place] [address]    Report Status 07/12/2017 FINAL   Culture, blood (routine x 2)  Result Value Ref  Range   Specimen Description BLOOD LEFT ARM    Special Requests      BOTTLES DRAWN AEROBIC AND ANAEROBIC Blood Culture adequate volume   Culture      NO GROWTH 5 DAYS Performed at [facility], [address] [place], [place] [address]    Report Status 07/12/2017 FINAL   Lactic acid, plasma  Result Value Ref Range   Lactic Acid, Venous 1.6 0.5 - 1.9 mmol/L  Lactic acid, plasma  Result Value Ref Range   Lactic Acid, Venous 0.9 0.5 - 1.9 mmol/L  Comprehensive metabolic panel  Result Value Ref Range   Sodium 132 (L) 135 - 145 mmol/L   Potassium 3.7 3.5 - 5.1 mmol/L   Chloride 97 (L) 101 - 111 mmol/L   CO2 25 22 - 32 mmol/L   Glucose, Bld 100 (H) 65 - 99 mg/dL   BUN 22 (H) 6 - 20 mg/dL   Creatinine, Ser 1.63 (H) 0.44 - 1.00 mg/dL   Calcium 9.4 8.9 - 10.3 mg/dL   Total Protein 8.0 6.5 - 8.1 g/dL   Albumin 4.5 3.5 - 5.0 g/dL   AST 25 15 - 41 U/L   ALT 20 14 - 54 U/L   Alkaline Phosphatase 72 38 - 126 U/L   Total Bilirubin 0.7 0.3 - 1.2 mg/dL   GFR calc non Af Amer 32 (L) >60 mL/min   GFR calc Af Amer 37 (L) >60 mL/min   Anion gap 10 5 - 15  CBC with Differential  Result Value Ref Range   WBC 5.6 3.6 - 11.0 K/uL   RBC 4.31 3.80 - 5.20 MIL/uL   Hemoglobin 12.7 12.0 - 16.0 g/dL   HCT 37.8 35.0 - 47.0 %   MCV 87.7 80.0 - 100.0 fL   MCH 29.5 26.0 - 34.0 pg   MCHC 33.6 32.0 - 36.0 g/dL   RDW 14.9 (H) 11.5 - 14.5 %   Platelets 276 150 - 440 K/uL   Neutrophils Relative % 79 %   Neutro Abs 4.5 1.4 - 6.5 K/uL   Lymphocytes Relative 6 %   Lymphs Abs 0.3 (L) 1.0 - 3.6 K/uL   Monocytes Relative 10 %   Monocytes Absolute 0.5 0.2 - 0.9 K/uL   Eosinophils Relative 4 %   Eosinophils Absolute 0.2 0 - 0.7 K/uL   Basophils  Relative 1 %   Basophils Absolute 0.1 0 - 0.1 K/uL  Urinalysis, Complete w Microscopic  Result Value Ref Range   Color, Urine STRAW (A) YELLOW   APPearance CLEAR (A) CLEAR   Specific Gravity, Urine 1.005 1.005 - 1.030   pH 6.0 5.0 - 8.0   Glucose, UA NEGATIVE NEGATIVE mg/dL   Hgb urine dipstick NEGATIVE NEGATIVE   Bilirubin Urine NEGATIVE NEGATIVE   Ketones, ur NEGATIVE NEGATIVE mg/dL   Protein, ur NEGATIVE NEGATIVE mg/dL   Nitrite NEGATIVE NEGATIVE   Leukocytes, UA SMALL (A) NEGATIVE   RBC / HPF 0-5 0 - 5 RBC/hpf   WBC, UA 6-30 0 - 5 WBC/hpf   Bacteria, UA NONE SEEN NONE SEEN   Squamous Epithelial / LPF 0-5 (A) NONE SEEN   Mucus PRESENT   Basic metabolic panel  Result Value Ref Range   Sodium 133 (L) 135 - 145 mmol/L   Potassium 3.7 3.5 - 5.1 mmol/L   Chloride 98 (L) 101 - 111 mmol/L   CO2 24 22 - 32 mmol/L   Glucose, Bld 110 (H) 65 - 99 mg/dL   BUN  24 (H) 6 - 20 mg/dL   Creatinine, Ser 1.51 (H) 0.44 - 1.00 mg/dL   Calcium 9.2 8.9 - 10.3 mg/dL   GFR calc non Af Amer 35 (L) >60 mL/min   GFR calc Af Amer 40 (L) >60 mL/min   Anion gap 11 5 - 15  Sedimentation rate  Result Value Ref Range   Sed Rate 46 (H) 0 - 30 mm/hr  CBC  Result Value Ref Range   WBC 5.6 3.6 - 11.0 K/uL   RBC 3.63 (L) 3.80 - 5.20 MIL/uL   Hemoglobin 10.7 (L) 12.0 - 16.0 g/dL   HCT 32.2 (L) 35.0 - 47.0 %   MCV 88.9 80.0 - 100.0 fL   MCH 29.4 26.0 - 34.0 pg   MCHC 33.0 32.0 - 36.0 g/dL   RDW 15.1 (H) 11.5 - 14.5 %   Platelets 230 150 - 440 K/uL  Basic metabolic panel  Result Value Ref Range   Sodium 135 135 - 145 mmol/L   Potassium 3.5 3.5 - 5.1 mmol/L   Chloride 103 101 - 111 mmol/L   CO2 23 22 - 32 mmol/L   Glucose, Bld 103 (H) 65 - 99 mg/dL   BUN 22 (H) 6 - 20 mg/dL   Creatinine, Ser 1.46 (H) 0.44 - 1.00 mg/dL   Calcium 8.8 (L) 8.9 - 10.3 mg/dL   GFR calc non Af Amer 36 (L) >60 mL/min   GFR calc Af Amer 42 (L) >60 mL/min   Anion gap 9 5 - 15      Assessment & Plan:   Problem List Items  Addressed This Visit      Cardiovascular and Mediastinum   Hypertension goal BP (blood pressure) < 140/90    Not at goal today, would like syst under 140; will add lasix for a few days to bring some fluid off which may help the pressure      Relevant Medications   hydrALAZINE (APRESOLINE) 50 MG tablet   lisinopril (PRINIVIL,ZESTRIL) 20 MG tablet   furosemide (LASIX) 20 MG tablet   Other Relevant Orders   Basic Metabolic Panel (BMET)     Nervous and Auditory   Multiple sclerosis (HCC)    Seeing Dr. Manuella Ghazi, neurologist      Relevant Orders   Ambulatory referral to [place]     Musculoskeletal and Integument   Skin ulcer ([place])   Relevant Orders   Ambulatory referral to Home Health     Immune and Lymphatic   Follicular lymphoma of intra-abdominal lymph nodes ([place])    Managed by oncologist        Genitourinary   CKD (chronic kidney disease) stage 3, GFR 30-59 ml/min (HCC)    Monitor and balance diuretic and kidney function      Acontractile bladder    Seeing Dr. Azzie Roup; indwelling Foley      Relevant Orders   Ambulatory referral to [facility]     Other   Medication monitoring encounter   Relevant Orders   Basic Metabolic Panel (BMET)   Swelling of lower extremity    Bilateral; will get home health to come see her and evaluate, wrap legs      Relevant Orders   Ambulatory referral to Home Health   Recurrent cellulitis of lower extremity    Going to see ID tomorrow; patient says there is no more swelling / lymphoma in the LEFT groin which might be compressing on venous return      Relevant Orders  Ambulatory referral to Home Health   Cellulitis of left lower leg    Recurrent; going to see ID tomorrow      Relevant Orders   Ambulatory referral to [place]    Other Visit Diagnoses    Screening for colon cancer    -  Primary   Relevant Orders   Cologuard   At high risk for falls       Relevant Orders   Ambulatory referral to [place]        Follow up plan: No follow-ups on file.  An after-visit summary was printed and given to the patient at [place].  Please see the patient instructions which may contain other information and recommendations beyond what is mentioned above in the assessment and plan.  Meds ordered this encounter  Medications  . hydrALAZINE (APRESOLINE) 50 MG tablet    Sig: Take 1 tablet (50 mg total) by mouth 3 (three) times daily.    Dispense:  90 tablet    Refill:  5  . lisinopril (PRINIVIL,ZESTRIL) 20 MG tablet    Sig: Take 1 tablet (20 mg total) by mouth daily.    Dispense:  30 tablet    Refill:  5  . DISCONTD: furosemide (LASIX) 20 MG tablet    Sig: Take 1 tablet (20 mg total) by mouth daily. For 3 days only    Dispense:  3 tablet    Refill:  0  . furosemide (LASIX) 20 MG tablet    Sig: Take 1 tablet (20 mg total) by mouth daily. STOP HCTZ    Dispense:  30 tablet    Refill:  0    Use this Rx instead; we'll do this instead of the HCTZ    Orders Placed This Encounter  Procedures  . Cologuard  . Basic Metabolic Panel (BMET)  . Ambulatory referral to [place]

## 2017-08-07 NOTE — Assessment & Plan Note (Signed)
Going to see ID tomorrow; patient says there is no more swelling / lymphoma in the LEFT groin which might be compressing on venous return

## 2017-08-07 NOTE — Assessment & Plan Note (Signed)
Not at goal today, would like syst under 140; will add lasix for a few days to bring some fluid off which may help the pressure

## 2017-08-07 NOTE — Assessment & Plan Note (Signed)
Seeing Dr. Manuella Ghazi, neurologist

## 2017-08-07 NOTE — Assessment & Plan Note (Signed)
Seeing Dr. Azzie Roup; indwelling Foley

## 2017-08-07 NOTE — Assessment & Plan Note (Signed)
Monitor and balance diuretic and kidney function

## 2017-08-07 NOTE — Patient Instructions (Addendum)
STOP the HCTZ Start the furosemide Check BMP on April 17th Let's have home health come see you We'll see if you can get a hospital bed in your home Please do see the infectious disease doctor

## 2017-08-07 NOTE — Assessment & Plan Note (Signed)
Bilateral; will get home health to come see her and evaluate, wrap legs

## 2017-08-14 ENCOUNTER — Inpatient Hospital Stay: Payer: PPO | Attending: Oncology

## 2017-08-25 DIAGNOSIS — L03115 Cellulitis of right lower limb: Secondary | ICD-10-CM | POA: Diagnosis not present

## 2017-08-25 DIAGNOSIS — C8590 Non-Hodgkin lymphoma, unspecified, unspecified site: Secondary | ICD-10-CM | POA: Diagnosis not present

## 2017-08-25 DIAGNOSIS — L03116 Cellulitis of left lower limb: Secondary | ICD-10-CM | POA: Diagnosis not present

## 2017-08-25 DIAGNOSIS — G35 Multiple sclerosis: Secondary | ICD-10-CM | POA: Diagnosis not present

## 2017-08-25 DIAGNOSIS — N312 Flaccid neuropathic bladder, not elsewhere classified: Secondary | ICD-10-CM | POA: Diagnosis not present

## 2017-08-28 ENCOUNTER — Emergency Department: Payer: Medicare HMO

## 2017-08-28 ENCOUNTER — Emergency Department
Admission: EM | Admit: 2017-08-28 | Discharge: 2017-08-28 | Disposition: A | Discharge status: Home / Self Care | Payer: Medicare HMO | Attending: Emergency Medicine | Admitting: Emergency Medicine

## 2017-08-28 ENCOUNTER — Encounter: Payer: Self-pay | Admitting: Emergency Medicine

## 2017-08-28 ENCOUNTER — Other Ambulatory Visit: Payer: Self-pay

## 2017-08-28 DIAGNOSIS — M79661 Pain in right lower leg: Secondary | ICD-10-CM | POA: Diagnosis not present

## 2017-08-28 DIAGNOSIS — Z79899 Other long term (current) drug therapy: Secondary | ICD-10-CM | POA: Insufficient documentation

## 2017-08-28 DIAGNOSIS — M7989 Other specified soft tissue disorders: Secondary | ICD-10-CM | POA: Diagnosis not present

## 2017-08-28 DIAGNOSIS — Z87891 Personal history of nicotine dependence: Secondary | ICD-10-CM | POA: Insufficient documentation

## 2017-08-28 DIAGNOSIS — I129 Hypertensive chronic kidney disease with stage 1 through stage 4 chronic kidney disease, or unspecified chronic kidney disease: Secondary | ICD-10-CM | POA: Insufficient documentation

## 2017-08-28 DIAGNOSIS — N183 Chronic kidney disease, stage 3 (moderate): Secondary | ICD-10-CM | POA: Insufficient documentation

## 2017-08-28 DIAGNOSIS — W19XXXA Unspecified fall, initial encounter: Secondary | ICD-10-CM

## 2017-08-28 DIAGNOSIS — J449 Chronic obstructive pulmonary disease, unspecified: Secondary | ICD-10-CM | POA: Insufficient documentation

## 2017-08-28 DIAGNOSIS — Z8572 Personal history of non-Hodgkin lymphomas: Secondary | ICD-10-CM | POA: Insufficient documentation

## 2017-08-28 DIAGNOSIS — R600 Localized edema: Secondary | ICD-10-CM | POA: Diagnosis not present

## 2017-08-28 DIAGNOSIS — L03116 Cellulitis of left lower limb: Secondary | ICD-10-CM | POA: Insufficient documentation

## 2017-08-28 DIAGNOSIS — M79662 Pain in left lower leg: Secondary | ICD-10-CM | POA: Diagnosis present

## 2017-08-28 LAB — COMPREHENSIVE METABOLIC PANEL
ALT: 19 U/L (ref 14–54)
AST: 19 U/L (ref 15–41)
Albumin: 3.8 g/dL (ref 3.5–5.0)
Alkaline Phosphatase: 72 U/L (ref 38–126)
Anion gap: 7 (ref 5–15)
BUN: 18 mg/dL (ref 6–20)
CO2: 25 mmol/L (ref 22–32)
Calcium: 9.1 mg/dL (ref 8.9–10.3)
Chloride: 103 mmol/L (ref 101–111)
Creatinine, Ser: 1.35 mg/dL — ABNORMAL HIGH (ref 0.44–1.00)
GFR calc Af Amer: 46 mL/min — ABNORMAL LOW (ref >60)
GFR calc non Af Amer: 40 mL/min — ABNORMAL LOW (ref >60)
Glucose, Bld: 113 mg/dL — ABNORMAL HIGH (ref 65–99)
Potassium: 4 mmol/L (ref 3.5–5.1)
Sodium: 135 mmol/L (ref 135–145)
Total Bilirubin: 0.6 mg/dL (ref 0.3–1.2)
Total Protein: 7.2 g/dL (ref 6.5–8.1)

## 2017-08-28 LAB — CBC WITH DIFFERENTIAL/PLATELET
Basophils Absolute: 0.1 10*3/uL (ref 0–0.1)
Basophils Relative: 1 %
Eosinophils Absolute: 0.1 10*3/uL (ref 0–0.7)
Eosinophils Relative: 2 %
HCT: 33.7 % — ABNORMAL LOW (ref 35.0–47.0)
Hemoglobin: 11.5 g/dL — ABNORMAL LOW (ref 12.0–16.0)
Lymphocytes Relative: 3 %
Lymphs Abs: 0.2 10*3/uL — ABNORMAL LOW (ref 1.0–3.6)
MCH: 29.6 pg (ref 26.0–34.0)
MCHC: 34 g/dL (ref 32.0–36.0)
MCV: 87.2 fL (ref 80.0–100.0)
Monocytes Absolute: 0.7 10*3/uL (ref 0.2–0.9)
Monocytes Relative: 10 %
Neutro Abs: 5.4 10*3/uL (ref 1.4–6.5)
Neutrophils Relative %: 84 %
Platelets: 214 10*3/uL (ref 150–440)
RBC: 3.87 MIL/uL (ref 3.80–5.20)
RDW: 14.9 % — ABNORMAL HIGH (ref 11.5–14.5)
WBC: 6.4 10*3/uL (ref 3.6–11.0)

## 2017-08-28 LAB — URINALYSIS, COMPLETE (UACMP) WITH MICROSCOPIC
Bilirubin Urine: NEGATIVE
Glucose, UA: NEGATIVE mg/dL
Ketones, ur: NEGATIVE mg/dL
Nitrite: NEGATIVE
Protein, ur: NEGATIVE mg/dL
Specific Gravity, Urine: 1.006 (ref 1.005–1.030)
WBC, UA: >50 WBC/hpf — ABNORMAL HIGH (ref 0–5)
pH: 7 (ref 5.0–8.0)

## 2017-08-28 LAB — MAGNESIUM: Magnesium: 1.8 mg/dL (ref 1.7–2.4)

## 2017-08-28 MED ORDER — ACETAMINOPHEN 325 MG PO TABS
ORAL_TABLET | ORAL | Status: AC
  Administered 2017-08-28: 650 mg via ORAL
  Filled 2017-08-28: qty 2

## 2017-08-28 MED ORDER — AMOXICILLIN-POT CLAVULANATE 875-125 MG PO TABS: 1.0000 | ORAL_TABLET | Freq: Two times a day (BID) | ORAL | 0 refills | Status: AC

## 2017-08-28 MED ORDER — ACETAMINOPHEN 325 MG PO TABS
650.0000 mg | ORAL_TABLET | Freq: Once | ORAL | Status: AC
  Administered 2017-08-28: 650 mg via ORAL

## 2017-08-28 NOTE — Discharge Instructions (Signed)
Use either the antibiotic your doctor gave you or the Augmentin 1 pill twice a day with food. Please return for increasing pain swelling or redness or feeling sicker. Keep the legs up as much as possible. Follow-up with your doctor within the next week or so.

## 2017-08-28 NOTE — ED Notes (Signed)
No fresh urine able to taken off of foley catheter at this time.

## 2017-08-28 NOTE — ED Provider Notes (Signed)
[facility] Emergency Department Provider Note   ____________________________________________   First MD Initiated Contact with Patient 08/28/17 1128     (approximate)  I have reviewed the triage vital signs and the nursing notes.   HISTORY  Chief Complaint Fall and Leg Pain    HPI [name] is a 67 y.o. female Patient reports chronic leg pain. She fell because of the leg pain. She has Unna boots on both legs and thinks her doctor wrote her for antibiotics but she hasn't had her friend pick them up yet. Patient describes the pain as severe crampy like pain in both legs. She is not running a fever. She doesn't remember what under the Unna boots.   Past Medical History:  Diagnosis Date  . Abdominal aortic atherosclerosis ([place]) 11/11/2016  . ADHD   . Anxiety   . COPD (chronic obstructive pulmonary disease) ([place])   . Depression    major depressive  . Dyspnea    doe  . Edema    left leg  . Follicular lymphoma ([place])    B Cell  . Follicular lymphoma grade II ([place])   . Hypertension   . Hypotension    idiopathic  . Kyphoscoliosis and scoliosis 11/26/2011  . Morbid obesity ([place]) 01/05/2016  . Multiple sclerosis ([place])   . Multiple sclerosis ([place])    1980's  . Neuromuscular disorder ([place])   . Obstructive and reflux uropathy    foley  . Pain    atypical facial  . Peripheral vascular disease of lower extremity with ulceration ([place]) 11/08/2015  . Skin ulcer ([place]) 11/08/2015  . Weakness    generalized. has MS    Patient Active Problem List   Diagnosis Date Noted  . Recurrent cellulitis of lower extremity 06/25/2017  . Medication monitoring encounter 06/05/2017  . Homicidal ideation 04/25/2017  . Cellulitis of left lower leg 04/25/2017  . Adjustment disorder with mixed disturbance of emotions and conduct 04/23/2017  . Foot pain, bilateral 02/03/2017  . Tinea pedis 02/03/2017  . Left ovarian cyst 12/09/2016  . Abdominal aortic atherosclerosis  ([place]) 11/11/2016  . Swelling of lower extremity 10/11/2016  . Obstructive sleep apnea 10/11/2016  . Follicular lymphoma of intra-abdominal lymph nodes ([place]) 08/06/2016  . Multiple falls 06/07/2016  . Inguinal adenopathy 05/29/2016  . Obesity, morbid ([place]) 01/05/2016  . Low HDL (under 40) 12/19/2015  . Skin ulcer ([place]) 11/08/2015  . Peripheral vascular disease of lower extremity with ulceration ([place]) 11/08/2015  . CKD (chronic kidney disease) stage 3, GFR 30-59 ml/min ([facility]) 11/08/2015  . Constipation due to pain medication 01/31/2015  . Obstructive sleep apnea of adult 01/13/2015  . Pelvic muscle wasting 01/13/2015  . Incomplete bladder emptying 01/13/2015  . Headache, migraine 10/24/2014  . 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 Current tobacco use 10/24/2014  . Lumbar radiculopathy, chronic 10/02/2013  . COPD with bronchial hyperresponsiveness ([place]) 10/02/2013  . Major depressive disorder, recurrent, in partial remission ([place]) 10/02/2013  . Hypertension goal BP (blood pressure) < 140/90 10/02/2013  . Multiple sclerosis ([place]) 10/02/2013  . Absence of bladder continence 09/25/2012  . Acontractile bladder 02/12/2012  . Narrowing of intervertebral disc space 11/26/2011  . Kyphoscoliosis and scoliosis 11/26/2011    Past Surgical History:  Procedure Laterality Date  . BACK SURGERY N/A 2002  . CYST EXCISION     lower back  . INGUINAL LYMPH NODE BIOPSY Left 07/04/2016   Procedure: INGUINAL LYMPH NODE BIOPSY;  Surgeon: Christene Lye, MD;  Location: [facility]  ORS;  Service: General;  Laterality: Left;  . PORTACATH PLACEMENT N/A 07/22/2016   Procedure: INSERTION PORT-A-CATH;  Surgeon: Christene Lye, MD;  Location: [facility] ORS;  Service: General;  Laterality: N/A;  . TONSILLECTOMY AND ADENOIDECTOMY    . TUBAL LIGATION      Prior to Admission medications   Medication Sig Start Date End Date Taking? Authorizing Provider  amoxicillin-clavulanate (AUGMENTIN) 875-125 MG tablet Take 1  tablet by mouth 2 (two) times daily for 10 days. 08/28/17 09/07/17  Nena Polio, MD  BIOTIN PO Take by mouth daily. Pt taking 500 mcg daily    [provider]  buPROPion (WELLBUTRIN XL) 300 MG 24 hr tablet Take 300 mg by mouth every morning.  05/28/16   [provider]  clonazePAM (KLONOPIN) 1 MG tablet Take 1 tablet (1 mg total) by mouth at bedtime. 01/06/17   [name], MD  Diapers & Supplies (HUGGIES PULL-UPS) MISC 1 each by Does not apply route 2 (two) times daily. Dx urinary incontinence, MS; LON 99 months 02/03/17   Arnetha Courser, MD  DULoxetine (CYMBALTA) 60 MG capsule Take 1 capsule (60 mg total) by mouth every morning. 10/14/16   Vaughan Basta, MD  furosemide (LASIX) 20 MG tablet Take 1 tablet (20 mg total) by mouth daily. STOP HCTZ 08/07/17   Lada, Satira Anis, MD  gabapentin (NEURONTIN) 600 MG tablet Take 1 tablet (600 mg total) by mouth 3 (three) times daily. 02/09/17   Bettey Costa, MD  hydrALAZINE (APRESOLINE) 50 MG tablet Take 1 tablet (50 mg total) by mouth 3 (three) times daily. 08/07/17   Arnetha Courser, MD  interferon beta-1a (AVONEX) 30 MCG/0.5ML PSKT injection Inject 30 mcg into the muscle every Monday.    [provider]  lactobacillus acidophilus & bulgar (LACTINEX) chewable tablet Chew 1 tablet by mouth 3 (three) times daily with meals. 05/15/17   Salary, Avel Peace, MD  lidocaine-prilocaine (EMLA) cream Apply 1 application topically as needed. apply cream over the port site and then place small piece of saran wrap over the cream to protect your clothing 11/07/16   Sindy Guadeloupe, MD  lisinopril (PRINIVIL,ZESTRIL) 20 MG tablet Take 1 tablet (20 mg total) by mouth daily. 08/07/17   Arnetha Courser, MD  methylphenidate (RITALIN) 10 MG tablet Take 1 tablet (10 mg total) by mouth 2 (two) times daily. 07/08/17   Bettey Costa, MD  Multiple Vitamin (MULTIVITAMIN WITH MINERALS) TABS tablet Take 1 tablet by mouth every evening.     [provider]    polyethylene glycol powder (GLYCOLAX/MIRALAX) powder Take 17 g by mouth daily as needed for mild constipation. Mixed in water Patient taking differently: Take 17 g by mouth daily as needed for mild constipation. For constipation 01/06/17   [name], MD  QUEtiapine Fumarate (SEROQUEL XR) 150 MG 24 hr tablet Take 150 mg by mouth at bedtime.     [provider]  tiZANidine (ZANAFLEX) 4 MG tablet Take 2 tablets by mouth 2 (two) times daily. 06/20/17   [provider]  topiramate (TOPAMAX) 50 MG tablet Take 1 tablet (50 mg total) by mouth daily. Patient taking differently: Take 50 mg by mouth daily with lunch.  10/14/16   Vaughan Basta, MD  vitamin C (ASCORBIC ACID) 500 MG tablet Take 500 mg by mouth every evening.     [provider]    Allergies Patient has no known allergies.  Family History  Problem Relation Age of Onset  . COPD  Mother   . Diabetes Mother   . Heart failure Mother   . Alcohol abuse Father   . Kidney disease Father   . Kidney failure Father   . Arthritis Sister   . CAD Maternal Grandmother   . Stroke Maternal Grandfather   . Arthritis Sister   . Mental illness Sister   . Arthritis Brother     Social History Social History   Tobacco Use  . Smoking status: Former Smoker    Packs/day: 1.00    Years: 20.00    Pack years: 20.00    Types: Cigarettes    Start date: 04/30/1995    Last attempt to quit: 02/03/2016    Years since quitting: 1.5  . Smokeless tobacco: Never Used  Substance Use Topics  . Alcohol use: No    Alcohol/week: 0.0 oz  . Drug use: Yes    Types: Marijuana    Comment: smokes THC occasionally per pt     Review of Systems  Constitutional: No fever/chills Eyes: No visual changes. ENT: No sore throat. Cardiovascular: Denies chest pain. Respiratory: Denies shortness of breath. Gastrointestinal: No abdominal pain.  No nausea, no vomiting.  No diarrhea.  No constipation. Genitourinary: chronic  dysuria. Musculoskeletal: Negative for back pain. Skin: Negative for rash. Neurological: Negative for headaches, new focal weakness   ____________________________________________   PHYSICAL EXAM:  VITAL SIGNS: ED Triage Vitals  Enc Vitals Group     BP 08/28/17 1016 (!) 153/56     Pulse Rate 08/28/17 1016 77     Resp 08/28/17 1016 20     Temp 08/28/17 1053 98.6 F (37 C)     Temp Source 08/28/17 1053 Oral     SpO2 08/28/17 1016 97 %     Weight 08/28/17 1017 300 lb (136.1 kg)     Height 08/28/17 1017 5\' 4"  (1.626 m)     Head Circumference --      Peak Flow --      Pain Score 08/28/17 1016 8     Pain Loc --      Pain Edu? --      Excl. in [place]? --     Constitutional: Alert and oriented. Well appearing and in no acute distress. Eyes: Conjunctivae are normal.  Head: Atraumatic. Nose: No congestion/rhinnorhea. Mouth/Throat: Mucous membranes are moist.  Oropharynx non-erythematous. Neck: No stridor.   Cardiovascular: Normal rate, regular rhythm. Grossly normal heart sounds.  Good peripheral circulation. Respiratory: Normal respiratory effort.  No retractions. Lungs CTAB. Gastrointestinal: Soft and nontender. No distention. No abdominal bruits. No CVA tenderness. Musculoskeletal: both legs are swollen The left leg and is slightly more swollen than the right it's redder with a area medially just above the knee which is read quite hot and swollen. Doesn't seem to be very tender.. Skin:  Skin is warm, see above musculoskeletal no Psychiatric: Mood and affect are normal. Speech and behavior are normal.  ____________________________________________   LABS (all labs ordered are listed, but only abnormal results are displayed)  Labs Reviewed  COMPREHENSIVE METABOLIC PANEL - Abnormal; Notable for the following components:      Result Value   Glucose, Bld 113 (*)    Creatinine, Ser 1.35 (*)    GFR calc non Af Amer 40 (*)    GFR calc Af Amer 46 (*)    All other components within  normal limits  CBC WITH DIFFERENTIAL/PLATELET - Abnormal; Notable for the following components:   Hemoglobin 11.5 (*)    HCT 33.7 (*)  RDW 14.9 (*)    Lymphs Abs 0.2 (*)    All other components within normal limits  URINALYSIS, COMPLETE (UACMP) WITH MICROSCOPIC - Abnormal; Notable for the following components:   Color, Urine YELLOW (*)    APPearance CLOUDY (*)    Hgb urine dipstick SMALL (*)    Leukocytes, UA LARGE (*)    WBC, UA >50 (*)    Bacteria, UA RARE (*)    All other components within normal limits  URINE CULTURE  MAGNESIUM   ____________________________________________  EKG   ____________________________________________  RADIOLOGY  ED MD interpretationultrasound shows no evidence of DVT  Official radiology report(s): US Venous Img Lower Bilateral  Result Date: 08/28/2017 CLINICAL DATA:  Swelling and redness x1 month EXAM: BILATERAL LOWER EXTREMITY VENOUS DOPPLER ULTRASOUND TECHNIQUE: Gray-scale sonography with compression, as well as color and duplex ultrasound, were performed to evaluate the deep venous system from the level of the common femoral vein through the popliteal and proximal calf veins. COMPARISON:  04/02/2017 FINDINGS: Normal compressibility of the common femoral, superficial femoral, and popliteal veins, as well as the proximal calf veins. No filling defects to suggest DVT on grayscale or color Doppler imaging. Doppler waveforms show normal direction of venous flow, normal respiratory phasicity and response to augmentation. Subcutaneous edema at the left calf level. IMPRESSION: No evidence of  lower extremity deep vein thrombosis, bilaterally. Electronically Signed   By: Lucrezia Europe M.D.   On: 08/28/2017 15:24    ____________________________________________   PROCEDURES  Procedure(s) performed:  Procedures  Critical Care performed:  ____________________________________________   INITIAL IMPRESSION / ASSESSMENT AND PLAN / ED COURSE  patient is  able to lift her legs without any pain. The area of the legs under the Unna boot looks actually quite good as 1 little scab it's about 1 cm on the anterior right shin the left shin is just slightly red under the Unna boot in one or 2 places last in 2 or 3 cm diameter it does not look as bad as the area above the Unna boot medially by the knee that I described previously and even that looks better now. I will give the patient some Augmentin and have her either take that or the antibiotic her doctor prescribed have her come back if she is any worse including fever or increasing redness or increasing pain or any other complaints. Patient will follow-up with her regular doctor in the next few days.        ____________________________________________   FINAL CLINICAL IMPRESSION(S) / ED DIAGNOSES  Final diagnoses:  Fall, initial encounter  Cellulitis of left lower extremity     ED Discharge Orders        Ordered    amoxicillin-clavulanate (AUGMENTIN) 875-125 MG tablet  2 times daily     08/28/17 1547       Note:  This document was prepared using Dragon voice recognition software and may include unintentional dictation errors.    Nena Polio, MD 08/28/17 2007

## 2017-08-28 NOTE — ED Notes (Signed)
Legs wrapped, given sprite.

## 2017-08-28 NOTE — ED Triage Notes (Addendum)
Pt arrived via EMS from home s/p fall.Pt reports having chronic leg pain which caused her to fall.  Pt states she is currently being treated for cellulitis but is not currently taking atbx. Pt states she thinks the doctor wrote her RX for atbx but her friend hasn't picked it up yet.    Pt has MS. Pt has bilateral UNNA boots on.  Pt also has chronic foley in place states it has not been changed in at least a month.

## 2017-08-28 NOTE — ED Notes (Signed)
Pt states that she does not want to call "[name]" back who has called about patient X2.

## 2017-08-28 NOTE — ED Notes (Signed)
Removed old foley, inserted new. Sam, RN to assist.

## 2017-08-28 NOTE — ED Notes (Signed)
Watching TV.  

## 2017-08-28 NOTE — ED Notes (Signed)
Report to Ashley, RN

## 2017-08-28 NOTE — ED Notes (Signed)
Pt states she fell on kitchen floor, hitting bilateral knees first. Pt denies hitting head and no LOC.

## 2017-08-31 LAB — URINE CULTURE: Culture: >=100000 — AB

## 2017-09-01 NOTE — Progress Notes (Signed)
ED Antimicrobial Stewardship Positive Culture Follow Up   [name] is an 67 y.o. female who presented to [facility] on 08/28/2017 with a chief complaint of  Chief Complaint  Patient presents with  . Fall  . Leg Pain    Recent Results (from the past 720 hour(s))  Urine culture     Status: Abnormal   Collection Time: 08/28/17 11:47 AM  Result Value Ref Range Status   Specimen Description   Final    URINE, CATHETERIZED Performed at [facility], [address] [place], [place] [address]    Special Requests   Final    NONE Performed at [facility], [address], [place] [address]    Culture (A)  Final    >=100,000 COLONIES/mL KLEBSIELLA PNEUMONIAE >=100,000 COLONIES/mL PSEUDOMONAS AERUGINOSA    Report Status 08/31/2017 FINAL  Final   Organism ID, Bacteria KLEBSIELLA PNEUMONIAE (A)  Final   Organism ID, Bacteria PSEUDOMONAS AERUGINOSA (A)  Final      Susceptibility   Klebsiella pneumoniae - MIC*    AMPICILLIN >=32 RESISTANT Resistant     CEFAZOLIN <=4 SENSITIVE Sensitive     CEFTRIAXONE <=1 SENSITIVE Sensitive     CIPROFLOXACIN 1 SENSITIVE Sensitive     GENTAMICIN <=1 SENSITIVE Sensitive     IMIPENEM <=0.25 SENSITIVE Sensitive     NITROFURANTOIN 256 RESISTANT Resistant     TRIMETH/SULFA <=20 SENSITIVE Sensitive     AMPICILLIN/SULBACTAM 8 SENSITIVE Sensitive     PIP/TAZO 8 SENSITIVE Sensitive     Extended ESBL NEGATIVE Sensitive     * >=100,000 COLONIES/mL KLEBSIELLA PNEUMONIAE   Pseudomonas aeruginosa - MIC*    CEFTAZIDIME 4 SENSITIVE Sensitive     CIPROFLOXACIN <=0.25 SENSITIVE Sensitive     GENTAMICIN <=1 SENSITIVE Sensitive     IMIPENEM 2 SENSITIVE Sensitive     PIP/TAZO 8 SENSITIVE Sensitive     CEFEPIME 2 SENSITIVE Sensitive     * >=100,000 COLONIES/mL PSEUDOMONAS AERUGINOSA    [x]  Treated with Augmentin for cellulits, organism resistant to prescribed antimicrobial []  Patient discharged originally without antimicrobial agent  and treatment is now indicated  New antibiotic prescription: Cipro 250 mg BID x 5 days, no refills  ED Provider: Dr. Harvest Dark  Spoke to patient and instructed to continue Augmentin and begin Cipro x 5 days. Called in prescription to [facility] in [place] 09/01/2017, 2:04 PM

## 2017-09-02 ENCOUNTER — Ambulatory Visit: Payer: Self-pay | Admitting: Family Medicine

## 2017-09-11 DIAGNOSIS — Z1212 Encounter for screening for malignant neoplasm of rectum: Secondary | ICD-10-CM | POA: Diagnosis not present

## 2017-09-11 DIAGNOSIS — Z1211 Encounter for screening for malignant neoplasm of colon: Secondary | ICD-10-CM | POA: Diagnosis not present

## 2017-09-18 ENCOUNTER — Ambulatory Visit: Payer: Medicare HMO | Admitting: Family Medicine

## 2017-09-25 ENCOUNTER — Inpatient Hospital Stay: Payer: Medicare HMO | Attending: Oncology

## 2017-09-27 LAB — COLOGUARD: Cologuard: NEGATIVE

## 2017-10-14 ENCOUNTER — Ambulatory Visit (INDEPENDENT_AMBULATORY_CARE_PROVIDER_SITE_OTHER): Payer: 59 | Admitting: Family Medicine

## 2017-10-14 ENCOUNTER — Encounter: Payer: Self-pay | Admitting: Family Medicine

## 2017-10-14 VITALS — BP 116/70 | HR 85 | Temp 98.5°F | Resp 14

## 2017-10-14 DIAGNOSIS — R296 Repeated falls: Secondary | ICD-10-CM | POA: Diagnosis not present

## 2017-10-14 DIAGNOSIS — L03116 Cellulitis of left lower limb: Secondary | ICD-10-CM | POA: Diagnosis not present

## 2017-10-14 DIAGNOSIS — D485 Neoplasm of uncertain behavior of skin: Secondary | ICD-10-CM

## 2017-10-14 DIAGNOSIS — Z1231 Encounter for screening mammogram for malignant neoplasm of breast: Secondary | ICD-10-CM

## 2017-10-14 DIAGNOSIS — L97921 Non-pressure chronic ulcer of unspecified part of left lower leg limited to breakdown of skin: Secondary | ICD-10-CM

## 2017-10-14 DIAGNOSIS — Z1239 Encounter for other screening for malignant neoplasm of breast: Secondary | ICD-10-CM

## 2017-10-14 DIAGNOSIS — L03119 Cellulitis of unspecified part of limb: Secondary | ICD-10-CM

## 2017-10-14 DIAGNOSIS — G35D Multiple sclerosis, unspecified: Secondary | ICD-10-CM

## 2017-10-14 DIAGNOSIS — N319 Neuromuscular dysfunction of bladder, unspecified: Secondary | ICD-10-CM | POA: Diagnosis not present

## 2017-10-14 DIAGNOSIS — G35 Multiple sclerosis: Secondary | ICD-10-CM

## 2017-10-14 NOTE — Patient Instructions (Addendum)
Please do call Dr. Blane Ohara office if you develop fever or worsening redness Elevate legs as much as possible and do the calf exercises We'll have home health come out Do follow-up with your urologist for bladder issues

## 2017-10-14 NOTE — Assessment & Plan Note (Signed)
Does not appear to be cellulitis yet, but at high risk given recurrences in the past and ulcer formation and edema; leg elevation; refer to home health for consideration of unna wraps again; note from ID reviewed with her, specifically his instructions; if fever, worsening erythema, then contact ID

## 2017-10-14 NOTE — Assessment & Plan Note (Signed)
Weakness, bladder issues, refer to home health

## 2017-10-14 NOTE — Assessment & Plan Note (Signed)
Refer to home health

## 2017-10-14 NOTE — Progress Notes (Signed)
BP 116/70   Pulse 85   Temp 98.5 F (36.9 C) (Oral)   Resp 14   SpO2 96%    Subjective:    Patient ID: [name], female    DOB: March 20, 1951, 67 y.o.   MRN: [id number]  HPI: [name] is a 67 y.o. female  Chief Complaint  Patient presents with  . Cellulitis    legs    HPI Patient has MS; has wheelchair for weakness all over; can do things with her arms; bladder issues with indwelling catheter seeing Dr. Bernardo Heater Patient having redness in the legs; left worse than right No fevers  She did see the infectious disease doctor, Dr. Ola Spurr; on 08/25/2017 Her instructions: Patient Instructions Angelena Form, MD - 08/25/2017 11:15 AM EDT  It was a pleasure to meet you today. I do not think you need antibiotics at this time but need much better control of your swelling in your legs. We will place bilateral unnawraps today and get [facility] to come out to change them every 5-7 days. If you get redness pain or swelling in the legs please start the Keflex I have sent in for you and call us to be seen. I will see you in 4 weeks. Start clotrimazole cream to the feet twice a day for at least 2-3 week  We talked about the instructions; home health did the wraps; they stopped doing those in late May or early June; several weeks; no nurse coming out any more; stopped a few weeks ago She wants to use [facility]  Finished with treatment for her lymphoma; reviewed Jan note and goes back in July  Last fall was in the wee hours of the morning; she reached for her recliner and it wasn't where she thought it was; in the dark Fall Risk  10/14/2017 08/07/2017 04/23/2017 03/25/2017 02/10/2017  Falls in the past year? Yes Yes No (No Data) (No Data)  Comment - - - Patient denies new falls today, since [facility] RN CM outreach last week where she reported a fall then Patient denies new falls since hospital discharge 02/09/17  Number falls in past yr: 2 or more 2 or more - - -  Injury with  Fall? No No - - -  Risk Factor Category  - High Fall Risk - - -  Risk for fall due to : - - - - -  Follow up - - - - -   Sore on the left side of the head; been there for months  Depression screen [facility] 2/9 10/14/2017 08/07/2017 04/23/2017 03/03/2017 02/04/2017  Decreased Interest 0 0 1 1 1   Down, Depressed, Hopeless 1 0 3 1 1   PHQ - 2 Score 1 0 4 2 2   Altered sleeping 3 - 1 0 0  Tired, decreased energy 3 - 3 2 3   Change in appetite 1 - 0 1 -  Feeling bad or failure about yourself  1 - 3 0 0  Trouble concentrating 0 - - 0 0  Moving slowly or fidgety/restless 0 - 2 0 1  Suicidal thoughts 0 - 0 0 0  PHQ-9 Score 9 - 13 5 6   Difficult doing work/chores - - - Somewhat difficult Not difficult at all  Some recent data might be hidden    Relevant past medical, surgical, family and social history reviewed Past Medical History:  Diagnosis Date  . Abdominal aortic atherosclerosis ([place]) 11/11/2016  . ADHD   . Anxiety   . COPD (  chronic obstructive pulmonary disease) ([place])   . Depression    major depressive  . Dyspnea    doe  . Edema    left leg  . Follicular lymphoma ([place])    B Cell  . Follicular lymphoma grade II ([place])   . Hypertension   . Hypotension    idiopathic  . Kyphoscoliosis and scoliosis 11/26/2011  . Morbid obesity ([place]) 01/05/2016  . Multiple sclerosis ([place])   . Multiple sclerosis ([place])    1980's  . Neuromuscular disorder ([place])   . Obstructive and reflux uropathy    foley  . Pain    atypical facial  . Peripheral vascular disease of lower extremity with ulceration ([place]) 11/08/2015  . Skin ulcer ([place]) 11/08/2015  . Weakness    generalized. has MS   Past Surgical History:  Procedure Laterality Date  . BACK SURGERY N/A 2002  . CYST EXCISION     lower back  . INGUINAL LYMPH NODE BIOPSY Left 07/04/2016   Procedure: INGUINAL LYMPH NODE BIOPSY;  Surgeon: Christene Lye, MD;  Location: [facility] ORS;  Service: General;  Laterality: Left;  . PORTACATH PLACEMENT N/A 07/22/2016     Procedure: INSERTION PORT-A-CATH;  Surgeon: Christene Lye, MD;  Location: [facility] ORS;  Service: General;  Laterality: N/A;  . TONSILLECTOMY AND ADENOIDECTOMY    . TUBAL LIGATION     Family History  Problem Relation Age of Onset  . COPD Mother   . Diabetes Mother   . Heart failure Mother   . Alcohol abuse Father   . Kidney disease Father   . Kidney failure Father   . Arthritis Sister   . CAD Maternal Grandmother   . Stroke Maternal Grandfather   . Arthritis Sister   . Mental illness Sister   . Arthritis Brother    Social History   Tobacco Use  . Smoking status: Former Smoker    Packs/day: 1.00    Years: 20.00    Pack years: 20.00    Types: Cigarettes    Start date: 04/30/1995    Last attempt to quit: 02/03/2016    Years since quitting: 1.6  . Smokeless tobacco: Never Used  Substance Use Topics  . Alcohol use: No    Alcohol/week: 0.0 oz  . Drug use: Yes    Types: Marijuana    Comment: smokes THC occasionally per pt     Interim medical history since last visit reviewed. Allergies and medications reviewed  Review of Systems Per HPI unless specifically indicated above     Objective:    BP 116/70   Pulse 85   Temp 98.5 F (36.9 C) (Oral)   Resp 14   SpO2 96%   Wt Readings from Last 3 Encounters:  08/28/17 300 lb (136.1 kg)  07/24/17 (!) 307 lb (139.3 kg)  07/07/17 299 lb 6.4 oz (135.8 kg)    Physical Exam  Constitutional: She appears well-developed and well-nourished. No distress.  Elderly obese female seated in wheelchair; gait not assessed; indwelling foley catheter, bag at side; NAD  HENT:  Head: Normocephalic and atraumatic.  Eyes: EOM are normal. No scleral icterus.  Neck: No thyromegaly present.  Cardiovascular: Normal rate, regular rhythm and normal heart sounds.  No murmur heard. Pulmonary/Chest: Effort normal and breath sounds normal. No respiratory distress. She has no wheezes.  Abdominal: Soft. Bowel sounds are normal. She exhibits no  distension.  Musculoskeletal: She exhibits edema (firm woody texture to both lower legs, left > right; mild pinkness but  nothing to suggest active celluitis; ulcer on upper left leg distal to patella; ulcer on right leg lower shin).  Neurological: She is alert. She exhibits normal muscle tone.  Skin: Skin is warm and dry. Lesion (15x10 mm lesion left of midline scalp; cratered center, erythematous edges) noted. She is not diaphoretic. No pallor.  Psychiatric: She has a normal mood and affect. Her behavior is normal. Judgment and thought content normal.       Assessment & Plan:   Problem List Items Addressed This Visit      Nervous and Auditory   Multiple sclerosis (HCC) - Primary    Weakness, bladder issues, refer to home health      Relevant Orders   Ambulatory referral to [place]     Other   Multiple falls   Relevant Orders   Ambulatory referral to [place]   Bladder neurogenesis   Relevant Orders   Ambulatory referral to [place]   Recurrent cellulitis of lower extremity    Does not appear to be cellulitis yet, but at high risk given recurrences in the past and ulcer formation and edema; leg elevation; refer to home health for consideration of unna wraps again; note from ID reviewed with her, specifically his instructions; if fever, worsening erythema, then contact ID      Relevant Orders   Ambulatory referral to [place]   Cellulitis of left lower leg    Refer to home health      Relevant Orders   Ambulatory referral to [place]    Other Visit Diagnoses    Neoplasm of uncertain behavior of scalp       refer to derm; ddx includes skin cancer   Relevant Orders   Ambulatory referral to Dermatology   Ulcer of left lower extremity, limited to breakdown of skin ([place])       home health referral entered   Screening for breast cancer       Relevant Orders   MM Digital Screening       Follow up plan: Return in about 3 months (around 01/14/2018) for  follow-up visit with Dr. Sanda Klein.  An after-visit summary was printed and given to the patient at [facility].  Please see the patient instructions which may contain other information and recommendations beyond what is mentioned above in the assessment and plan.  No orders of the defined types were placed in this encounter.   Orders Placed This Encounter  Procedures  . MM Digital Screening  . Ambulatory referral to Dermatology  . Ambulatory referral to [place]   Face-to-face time with patient was more than 25 minutes, >50% time spent counseling and coordination of care

## 2017-10-21 DIAGNOSIS — I872 Venous insufficiency (chronic) (peripheral): Secondary | ICD-10-CM | POA: Diagnosis not present

## 2017-10-21 DIAGNOSIS — L03115 Cellulitis of right lower limb: Secondary | ICD-10-CM | POA: Diagnosis not present

## 2017-10-21 DIAGNOSIS — J449 Chronic obstructive pulmonary disease, unspecified: Secondary | ICD-10-CM | POA: Diagnosis not present

## 2017-10-21 DIAGNOSIS — Z466 Encounter for fitting and adjustment of urinary device: Secondary | ICD-10-CM | POA: Diagnosis not present

## 2017-10-21 DIAGNOSIS — I129 Hypertensive chronic kidney disease with stage 1 through stage 4 chronic kidney disease, or unspecified chronic kidney disease: Secondary | ICD-10-CM | POA: Diagnosis not present

## 2017-10-21 DIAGNOSIS — L03116 Cellulitis of left lower limb: Secondary | ICD-10-CM | POA: Diagnosis not present

## 2017-10-21 DIAGNOSIS — N183 Chronic kidney disease, stage 3 (moderate): Secondary | ICD-10-CM | POA: Diagnosis not present

## 2017-10-21 DIAGNOSIS — G35 Multiple sclerosis: Secondary | ICD-10-CM | POA: Diagnosis not present

## 2017-10-21 DIAGNOSIS — M5416 Radiculopathy, lumbar region: Secondary | ICD-10-CM | POA: Diagnosis not present

## 2017-10-21 DIAGNOSIS — I739 Peripheral vascular disease, unspecified: Secondary | ICD-10-CM | POA: Diagnosis not present

## 2017-10-21 DIAGNOSIS — N319 Neuromuscular dysfunction of bladder, unspecified: Secondary | ICD-10-CM | POA: Diagnosis not present

## 2017-10-22 ENCOUNTER — Telehealth: Payer: Self-pay | Admitting: Family Medicine

## 2017-10-22 NOTE — Telephone Encounter (Signed)
Left detailed voicemail

## 2017-10-22 NOTE — Telephone Encounter (Signed)
Does she need to get this order from urology?

## 2017-10-22 NOTE — Telephone Encounter (Signed)
Copied from [place] [id number]. Topic: Quick Communication - See Telephone Encounter >> Oct 22, 2017 11:33 AM Percell Belt A wrote: CRM for notification. See Telephone encounter for: 10/22/17.  Pt called in and stated that she needs a order sent over to her home health to change her catheter.  [facility] is pt home health.  Phone number [phone]

## 2017-10-22 NOTE — Telephone Encounter (Signed)
Yes, all orders that have to do with her catheter need to come from her urologist They keep sending me orders and I keep writing "[place]" or "urologist" over my name and they keep sending them back anyway Please make sure they understand I am not her urologist; thank you

## 2017-10-27 ENCOUNTER — Telehealth: Payer: Self-pay

## 2017-10-27 ENCOUNTER — Ambulatory Visit: Payer: 59 | Admitting: Family Medicine

## 2017-10-27 NOTE — Telephone Encounter (Signed)
I cannot fix her catheter Dr. Bernardo Heater probably has someone covering for him during his absence She should contact his office or go to the ER

## 2017-10-27 NOTE — Telephone Encounter (Signed)
Copied from [place] [id number]. Topic: General - Other >> Oct 27, 2017 12:15 PM Keene Breath wrote: Reason for CRM: Patient called to request that Dr. Sanda Klein fix her catheter because it is leaking.  Her doctor is Dr. Bernardo Heater, but he will be out of the office this week.  Her appt. Is July 16th.  It was suggested that patient call to see if Dr. Sanda Klein would do this for her one time to prevent patient from going to the ER.  Please advise.  CB# [phone].  Please advise

## 2017-10-28 DIAGNOSIS — I129 Hypertensive chronic kidney disease with stage 1 through stage 4 chronic kidney disease, or unspecified chronic kidney disease: Secondary | ICD-10-CM | POA: Diagnosis not present

## 2017-10-28 DIAGNOSIS — I739 Peripheral vascular disease, unspecified: Secondary | ICD-10-CM | POA: Diagnosis not present

## 2017-10-28 DIAGNOSIS — L03116 Cellulitis of left lower limb: Secondary | ICD-10-CM | POA: Diagnosis not present

## 2017-10-28 DIAGNOSIS — N183 Chronic kidney disease, stage 3 (moderate): Secondary | ICD-10-CM | POA: Diagnosis not present

## 2017-10-28 DIAGNOSIS — G35 Multiple sclerosis: Secondary | ICD-10-CM | POA: Diagnosis not present

## 2017-10-28 DIAGNOSIS — J449 Chronic obstructive pulmonary disease, unspecified: Secondary | ICD-10-CM | POA: Diagnosis not present

## 2017-10-28 DIAGNOSIS — I872 Venous insufficiency (chronic) (peripheral): Secondary | ICD-10-CM | POA: Diagnosis not present

## 2017-10-28 DIAGNOSIS — M5416 Radiculopathy, lumbar region: Secondary | ICD-10-CM | POA: Diagnosis not present

## 2017-10-28 DIAGNOSIS — L03115 Cellulitis of right lower limb: Secondary | ICD-10-CM | POA: Diagnosis not present

## 2017-10-28 NOTE — Telephone Encounter (Signed)
I contacted this patient to review Dr. Delight Ovens message. She said ok but sounded disappointed.

## 2017-11-04 ENCOUNTER — Telehealth: Payer: Self-pay

## 2017-11-04 DIAGNOSIS — I739 Peripheral vascular disease, unspecified: Secondary | ICD-10-CM | POA: Diagnosis not present

## 2017-11-04 DIAGNOSIS — I129 Hypertensive chronic kidney disease with stage 1 through stage 4 chronic kidney disease, or unspecified chronic kidney disease: Secondary | ICD-10-CM | POA: Diagnosis not present

## 2017-11-04 DIAGNOSIS — L03116 Cellulitis of left lower limb: Secondary | ICD-10-CM | POA: Diagnosis not present

## 2017-11-04 DIAGNOSIS — J449 Chronic obstructive pulmonary disease, unspecified: Secondary | ICD-10-CM | POA: Diagnosis not present

## 2017-11-04 DIAGNOSIS — M5416 Radiculopathy, lumbar region: Secondary | ICD-10-CM | POA: Diagnosis not present

## 2017-11-04 DIAGNOSIS — G35 Multiple sclerosis: Secondary | ICD-10-CM | POA: Diagnosis not present

## 2017-11-04 DIAGNOSIS — N183 Chronic kidney disease, stage 3 (moderate): Secondary | ICD-10-CM | POA: Diagnosis not present

## 2017-11-04 DIAGNOSIS — L03115 Cellulitis of right lower limb: Secondary | ICD-10-CM | POA: Diagnosis not present

## 2017-11-04 DIAGNOSIS — I872 Venous insufficiency (chronic) (peripheral): Secondary | ICD-10-CM | POA: Diagnosis not present

## 2017-11-04 NOTE — Telephone Encounter (Signed)
Copied from [place] [id number]. Topic: Quick Communication - See Telephone Encounter >> Nov 04, 2017  3:40 PM Antonieta Iba C wrote: CRM for notification. See Telephone encounter for: 11/04/17.  Colletta Maryland OT w/ [facility] called in for verbal orders.   Frequency: 2 times a week for 5 weeks.  CB: [phone]

## 2017-11-04 NOTE — Telephone Encounter (Signed)
Okay for orders as requested 

## 2017-11-04 NOTE — Telephone Encounter (Signed)
Verbal voicemail left 

## 2017-11-05 ENCOUNTER — Other Ambulatory Visit: Payer: Self-pay | Admitting: Family Medicine

## 2017-11-06 ENCOUNTER — Encounter: Payer: Self-pay | Admitting: Oncology

## 2017-11-06 ENCOUNTER — Inpatient Hospital Stay (HOSPITAL_BASED_OUTPATIENT_CLINIC_OR_DEPARTMENT_OTHER): Payer: Medicare HMO | Admitting: Oncology

## 2017-11-06 ENCOUNTER — Other Ambulatory Visit: Payer: Self-pay

## 2017-11-06 ENCOUNTER — Inpatient Hospital Stay: Payer: Medicare HMO | Attending: Oncology

## 2017-11-06 VITALS — BP 150/87 | HR 94 | Temp 97.3°F | Resp 16 | Ht 64.0 in | Wt 302.4 lb

## 2017-11-06 DIAGNOSIS — Z87891 Personal history of nicotine dependence: Secondary | ICD-10-CM

## 2017-11-06 DIAGNOSIS — Z79899 Other long term (current) drug therapy: Secondary | ICD-10-CM | POA: Diagnosis not present

## 2017-11-06 DIAGNOSIS — Z8249 Family history of ischemic heart disease and other diseases of the circulatory system: Secondary | ICD-10-CM

## 2017-11-06 DIAGNOSIS — Z1239 Encounter for other screening for malignant neoplasm of breast: Secondary | ICD-10-CM

## 2017-11-06 DIAGNOSIS — Z08 Encounter for follow-up examination after completed treatment for malignant neoplasm: Secondary | ICD-10-CM

## 2017-11-06 DIAGNOSIS — C8293 Follicular lymphoma, unspecified, intra-abdominal lymph nodes: Secondary | ICD-10-CM

## 2017-11-06 DIAGNOSIS — Z8572 Personal history of non-Hodgkin lymphomas: Secondary | ICD-10-CM

## 2017-11-06 LAB — CBC WITH DIFFERENTIAL/PLATELET
Basophils Absolute: 0.1 10*3/uL (ref 0–0.1)
Basophils Relative: 1 %
Eosinophils Absolute: 0.2 10*3/uL (ref 0–0.7)
Eosinophils Relative: 3 %
HCT: 39.2 % (ref 35.0–47.0)
Hemoglobin: 13.2 g/dL (ref 12.0–16.0)
Lymphocytes Relative: 7 %
Lymphs Abs: 0.4 10*3/uL — ABNORMAL LOW (ref 1.0–3.6)
MCH: 29.2 pg (ref 26.0–34.0)
MCHC: 33.7 g/dL (ref 32.0–36.0)
MCV: 86.6 fL (ref 80.0–100.0)
Monocytes Absolute: 0.6 10*3/uL (ref 0.2–0.9)
Monocytes Relative: 9 %
Neutro Abs: 5.1 10*3/uL (ref 1.4–6.5)
Neutrophils Relative %: 80 %
Platelets: 282 10*3/uL (ref 150–440)
RBC: 4.52 MIL/uL (ref 3.80–5.20)
RDW: 15.6 % — ABNORMAL HIGH (ref 11.5–14.5)
WBC: 6.4 10*3/uL (ref 3.6–11.0)

## 2017-11-06 LAB — COMPREHENSIVE METABOLIC PANEL
ALT: 22 U/L (ref 0–44)
AST: 28 U/L (ref 15–41)
Albumin: 4.5 g/dL (ref 3.5–5.0)
Alkaline Phosphatase: 69 U/L (ref 38–126)
Anion gap: 10 (ref 5–15)
BUN: 17 mg/dL (ref 8–23)
CO2: 23 mmol/L (ref 22–32)
Calcium: 9.6 mg/dL (ref 8.9–10.3)
Chloride: 104 mmol/L (ref 98–111)
Creatinine, Ser: 1.32 mg/dL — ABNORMAL HIGH (ref 0.44–1.00)
GFR calc Af Amer: 48 mL/min — ABNORMAL LOW (ref >60)
GFR calc non Af Amer: 41 mL/min — ABNORMAL LOW (ref >60)
Glucose, Bld: 112 mg/dL — ABNORMAL HIGH (ref 70–99)
Potassium: 3.6 mmol/L (ref 3.5–5.1)
Sodium: 137 mmol/L (ref 135–145)
Total Bilirubin: 0.6 mg/dL (ref 0.3–1.2)
Total Protein: 7.5 g/dL (ref 6.5–8.1)

## 2017-11-06 LAB — LACTATE DEHYDROGENASE: LDH: 143 U/L (ref 98–192)

## 2017-11-06 MED ORDER — HEPARIN SOD (PORK) LOCK FLUSH 100 UNIT/ML IV SOLN: 500.0000 [IU] | Freq: Once | INTRAVENOUS | Status: DC

## 2017-11-06 MED ORDER — SODIUM CHLORIDE 0.9% FLUSH
10.0000 mL | Freq: Once | INTRAVENOUS | Status: DC
  Filled 2017-11-06: qty 10

## 2017-11-06 NOTE — Progress Notes (Signed)
Patient has urinary catheter inserted she has an appt. With Dr. Bernardo Heater to evaluate necessity of catheter.

## 2017-11-07 DIAGNOSIS — I739 Peripheral vascular disease, unspecified: Secondary | ICD-10-CM | POA: Diagnosis not present

## 2017-11-07 DIAGNOSIS — J449 Chronic obstructive pulmonary disease, unspecified: Secondary | ICD-10-CM | POA: Diagnosis not present

## 2017-11-07 DIAGNOSIS — I129 Hypertensive chronic kidney disease with stage 1 through stage 4 chronic kidney disease, or unspecified chronic kidney disease: Secondary | ICD-10-CM | POA: Diagnosis not present

## 2017-11-07 DIAGNOSIS — M5416 Radiculopathy, lumbar region: Secondary | ICD-10-CM | POA: Diagnosis not present

## 2017-11-07 DIAGNOSIS — G35 Multiple sclerosis: Secondary | ICD-10-CM | POA: Diagnosis not present

## 2017-11-07 DIAGNOSIS — L03115 Cellulitis of right lower limb: Secondary | ICD-10-CM | POA: Diagnosis not present

## 2017-11-07 DIAGNOSIS — I872 Venous insufficiency (chronic) (peripheral): Secondary | ICD-10-CM | POA: Diagnosis not present

## 2017-11-07 DIAGNOSIS — L03116 Cellulitis of left lower limb: Secondary | ICD-10-CM | POA: Diagnosis not present

## 2017-11-07 DIAGNOSIS — N183 Chronic kidney disease, stage 3 (moderate): Secondary | ICD-10-CM | POA: Diagnosis not present

## 2017-11-10 DIAGNOSIS — I872 Venous insufficiency (chronic) (peripheral): Secondary | ICD-10-CM | POA: Diagnosis not present

## 2017-11-10 DIAGNOSIS — M5416 Radiculopathy, lumbar region: Secondary | ICD-10-CM | POA: Diagnosis not present

## 2017-11-10 DIAGNOSIS — J449 Chronic obstructive pulmonary disease, unspecified: Secondary | ICD-10-CM | POA: Diagnosis not present

## 2017-11-10 DIAGNOSIS — G35 Multiple sclerosis: Secondary | ICD-10-CM | POA: Diagnosis not present

## 2017-11-10 DIAGNOSIS — I739 Peripheral vascular disease, unspecified: Secondary | ICD-10-CM | POA: Diagnosis not present

## 2017-11-10 DIAGNOSIS — L03116 Cellulitis of left lower limb: Secondary | ICD-10-CM | POA: Diagnosis not present

## 2017-11-10 DIAGNOSIS — L03115 Cellulitis of right lower limb: Secondary | ICD-10-CM | POA: Diagnosis not present

## 2017-11-10 DIAGNOSIS — N183 Chronic kidney disease, stage 3 (moderate): Secondary | ICD-10-CM | POA: Diagnosis not present

## 2017-11-10 DIAGNOSIS — I129 Hypertensive chronic kidney disease with stage 1 through stage 4 chronic kidney disease, or unspecified chronic kidney disease: Secondary | ICD-10-CM | POA: Diagnosis not present

## 2017-11-10 NOTE — Progress Notes (Signed)
Hematology/Oncology Consult note [facility]  [phone] [phone]  Patient Care Team: Arnetha Courser, MD as PCP - General (Family Medicine) Sanda Klein, Satira Anis, MD as Attending Physician (Family Medicine) Christene Lye, MD (General Surgery) Clent Jacks, RN as Registered Nurse Abbie Sons, MD (Urology)   Name of the patient: [name]  [id number]  1951-02-27   Date of visit: 11/10/17  Diagnosis- Stage II follicular lymphoma s/p 4 doses of rituxan. Currently under observation  Chief complaint/ Reason for visit- routine f/u of follicular lymphoma  Heme/Onc history: 1. patient is a 67 year old female who has had ongoing problems with pain in her left groin as well as his left leg swelling.   2. She had a Doppler of her left lower extremity on 02/28/2016 which showed: 1. Mild to moderate edema within the left calf. No evidence for acute DVT from left groin to popliteal fossa. Slightly limited evaluation of calf veins due to edema. 2. Left inguinal enlarged lymph nodes up to 6.2 cm, findings could be secondary to reactive node, infection, inflammation, or lymphoproliferative disease.  3. She had a repeat Doppler on 05/24/2016 which showed: IMPRESSION: 1. No evidence of lower extremity deep vein thrombosis, left. 2. Left inguinal lymphadenopathy. Differential diagnosis: Reactive adenopathy, lymphoma, metastatic disease.  4. CT abdomen on 06/14/2016 showed:  IMPRESSION: Lymphadenopathy within the left inguinal region and the left iliac chain. This is highly suspicious for underlying neoplasm. Biopsy is recommended for further evaluation.  5. Patient was seen by Dr. Jamal Collin from surgery and underwent excisional biopsy of the leftinguinal lymph node under ultrasound on 07/04/2016. Final pathology showed DIAGNOSIS:  A-B. LYMPH NODE, LEFT INGUINAL; EXCISION:  - FOLLICULAR LYMPHOMA, GRADE 1-2, PREDOMINANTLY  FOLLICULAR.   6. Patient has multiple sclerosis and has h/o depression and anxiety. She was given clonopin this morning and currently patient is drowsy but able to be aroused. She drifts off to sleep in between conversation. She is here with her friend who helps her out. Patient lives with her husband at baseline and uses a walker to ambulate. Reports pain and discomfort in her left groin. Denies fevers, chills, night sweats or weight loss  7. PET/CT scan from 07/16/16 showed: IMPRESSION: 1. Pathologic and hypermetabolic lower retroperitoneal, pelvic, and inguinal adenopathy compatible with [place]au[place] 5 malignancy. 2. Mildly prominent right lower paratracheal lymph node, Deauville 4. 3. Accentuated activity in the right lateral pterygoid muscle without CT correlate, probably incidental, merits attention on any follow up. 4. Other imaging findings of potential clinical significance: Airway thickening is present, suggesting bronchitis or reactive airways disease. Trace left pleural effusion. Coronary and aortoiliac atherosclerosis. Prominent stool throughout the colon favors constipation. Late phase healing fractures of left lower anterior ribs.  8. Bone marrow biopsy from 07/18/2016 did not show any evidence of lymphoma. HIV and hepatitis B and hepatitis C testing was negative  9. Patient received 5 doses of rituxan between April-July 2018. Also received RT to her inguinal region  Interval history- she has a chronic foley. No recent UTI's. She has chronic RLE edema and currently has ace wraps in place. Reports chronic fatigue  ECOG PS- 2 Pain scale- 0   Review of systems- Review of Systems  Constitutional: Positive for malaise/fatigue. Negative for chills, fever and weight loss.  HENT: Negative for congestion, ear discharge and nosebleeds.   Eyes: Negative for blurred vision.  Respiratory: Negative for cough, hemoptysis, sputum production, shortness of breath and wheezing.  Cardiovascular: Negative for chest pain, palpitations, orthopnea and claudication.  Gastrointestinal: Negative for abdominal pain, blood in stool, constipation, diarrhea, heartburn, melena, nausea and vomiting.  Genitourinary: Negative for dysuria, flank pain, frequency, hematuria and urgency.  Musculoskeletal: Negative for back pain, joint pain and myalgias.  Skin: Negative for rash.  Neurological: Negative for dizziness, tingling, focal weakness, seizures, weakness and headaches.  Endo/Heme/Allergies: Does not bruise/bleed easily.  Psychiatric/Behavioral: Negative for depression and suicidal ideas. The patient does not have insomnia.      Current treatment- observation  No Known Allergies   Past Medical History:  Diagnosis Date  . Abdominal aortic atherosclerosis ([place]) 11/11/2016  . ADHD   . Anxiety   . COPD (chronic obstructive pulmonary disease) ([place])   . Depression    major depressive  . Dyspnea    doe  . Edema    left leg  . Follicular lymphoma ([place])    B Cell  . Follicular lymphoma grade II ([place])   . Hypertension   . Hypotension    idiopathic  . Kyphoscoliosis and scoliosis 11/26/2011  . Morbid obesity ([place]) 01/05/2016  . Multiple sclerosis ([place])   . Multiple sclerosis ([place])    1980's  . Neuromuscular disorder ([place])   . Obstructive and reflux uropathy    foley  . Pain    atypical facial  . Peripheral vascular disease of lower extremity with ulceration ([place]) 11/08/2015  . Skin ulcer ([place]) 11/08/2015  . Weakness    generalized. has MS     Past Surgical History:  Procedure Laterality Date  . BACK SURGERY N/A 2002  . CYST EXCISION     lower back  . INGUINAL LYMPH NODE BIOPSY Left 07/04/2016   Procedure: INGUINAL LYMPH NODE BIOPSY;  Surgeon: Christene Lye, MD;  Location: [facility] ORS;  Service: General;  Laterality: Left;  . PORTACATH PLACEMENT N/A 07/22/2016   Procedure: INSERTION PORT-A-CATH;  Surgeon: Christene Lye, MD;  Location: [facility] ORS;   Service: General;  Laterality: N/A;  . TONSILLECTOMY AND ADENOIDECTOMY    . TUBAL LIGATION      Social History   Socioeconomic History  . Marital status: Married    Spouse name: Not on file  . Number of children: 3  . Years of education: Not on file  . Highest education level: Not on file  Occupational History  . Occupation: disabled  Social Needs  . Financial resource strain: Not on file  . Food insecurity:    Worry: Not on file    Inability: Not on file  . Transportation needs:    Medical: Not on file    Non-medical: Not on file  Tobacco Use  . Smoking status: Former Smoker    Packs/day: 1.00    Years: 20.00    Pack years: 20.00    Types: Cigarettes    Start date: 04/30/1995    Last attempt to quit: 02/03/2016    Years since quitting: 1.7  . Smokeless tobacco: Never Used  Substance and Sexual Activity  . Alcohol use: No    Alcohol/week: 0.0 oz  . Drug use: Yes    Types: Marijuana    Comment: smokes THC occasionally per pt   . Sexual activity: Not Currently    Birth control/protection: None  Lifestyle  . Physical activity:    Days per week: Not on file    Minutes per session: Not on file  . Stress: Not on file  Relationships  . Social connections:    Talks on  phone: Not on file    Gets together: Not on file    Attends religious service: Not on file    Active member of club or organization: Not on file    Attends meetings of clubs or organizations: Not on file    Relationship status: Not on file  . Intimate partner violence:    Fear of current or ex partner: Not on file    Emotionally abused: Not on file    Physically abused: Not on file    Forced sexual activity: Not on file  Other Topics Concern  . Not on file  Social History Narrative   She is married.     Family History  Problem Relation Age of Onset  . COPD Mother   . Diabetes Mother   . Heart failure Mother   . Alcohol abuse Father   . Kidney disease Father   . Kidney failure Father   .  Arthritis Sister   . CAD Maternal Grandmother   . Stroke Maternal Grandfather   . Arthritis Sister   . Mental illness Sister   . Arthritis Brother      Current Outpatient Medications:  .  BIOTIN PO, Take by mouth daily. Pt taking 500 mcg daily, Disp: , Rfl:  .  buPROPion (WELLBUTRIN XL) 300 MG 24 hr tablet, Take 300 mg by mouth every morning. , Disp: , Rfl:  .  clonazePAM (KLONOPIN) 1 MG tablet, Take 1 tablet (1 mg total) by mouth at bedtime., Disp: 5 tablet, Rfl: 0 .  Diapers & Supplies (HUGGIES PULL-UPS) MISC, 1 each by Does not apply route 2 (two) times daily. Dx urinary incontinence, MS; LON 99 months, Disp: 60 each, Rfl: 11 .  DULoxetine (CYMBALTA) 60 MG capsule, Take 1 capsule (60 mg total) by mouth every morning., Disp: 30 capsule, Rfl: 3 .  furosemide (LASIX) 20 MG tablet, Take 1 tablet (20 mg total) by mouth daily. STOP HCTZ, Disp: 30 tablet, Rfl: 0 .  gabapentin (NEURONTIN) 600 MG tablet, Take 1 tablet (600 mg total) by mouth 3 (three) times daily., Disp: , Rfl:  .  hydrALAZINE (APRESOLINE) 50 MG tablet, Take 1 tablet (50 mg total) by mouth 3 (three) times daily. (Patient taking differently: Take 50 mg by mouth 2 (two) times daily. ), Disp: 90 tablet, Rfl: 5 .  interferon beta-1a (AVONEX) 30 MCG/0.5ML PSKT injection, Inject 30 mcg into the muscle every Monday., Disp: , Rfl:  .  lactobacillus acidophilus & bulgar (LACTINEX) chewable tablet, Chew 1 tablet by mouth 3 (three) times daily with meals., Disp: 15 tablet, Rfl: 0 .  lisinopril (PRINIVIL,ZESTRIL) 20 MG tablet, Take 1 tablet (20 mg total) by mouth daily., Disp: 30 tablet, Rfl: 5 .  methylphenidate (RITALIN) 10 MG tablet, Take 1 tablet (10 mg total) by mouth 2 (two) times daily., Disp: , Rfl:  .  Multiple Vitamin (MULTIVITAMIN WITH MINERALS) TABS tablet, Take 1 tablet by mouth every evening. , Disp: , Rfl:  .  polyethylene glycol powder (GLYCOLAX/MIRALAX) powder, Take 17 g by mouth daily as needed for mild constipation. Mixed in  water (Patient taking differently: Take 17 g by mouth daily as needed for mild constipation. For constipation), Disp: , Rfl:  .  QUEtiapine Fumarate (SEROQUEL XR) 150 MG 24 hr tablet, Take 150 mg by mouth at bedtime. , Disp: , Rfl: 4 .  tiZANidine (ZANAFLEX) 4 MG tablet, Take 2 tablets by mouth 2 (two) times daily., Disp: , Rfl: 0 .  topiramate (TOPAMAX) 50 MG  tablet, Take 1 tablet (50 mg total) by mouth daily., Disp: 30 tablet, Rfl: 0 .  vitamin C (ASCORBIC ACID) 500 MG tablet, Take 500 mg by mouth every evening. , Disp: , Rfl:  No current facility-administered medications for this visit.   Facility-Administered Medications Ordered in Other Visits:  .  heparin lock flush 100 unit/mL, 500 Units, Intracatheter, Once PRN, Sindy Guadeloupe, MD  Physical exam:  Vitals:   11/06/17 1512 11/06/17 1521  BP:  (!) 150/87  Pulse:  94  Resp: 16   Temp:  (!) 97.3 F (36.3 C)  Weight: (!) 302 lb 6.4 oz (137.2 kg)   Height: _0  (1.626 m)    Physical Exam  Constitutional: She is oriented to person, place, and time.  She is obese. Sitting in a wheelchair. Does not appear to be in acute distress  HENT:  Head: Normocephalic and atraumatic.  Eyes: Pupils are equal, round, and reactive to light. EOM are normal.  Neck: Normal range of motion.  Cardiovascular: Normal rate, regular rhythm and normal heart sounds.  Pulmonary/Chest: Effort normal and breath sounds normal.  Abdominal: Soft. Bowel sounds are normal.  Chronic foley in place  Musculoskeletal:  B/l ACE wraps in place  Neurological: She is alert and oriented to person, place, and time.  Skin: Skin is warm and dry.     CMP Latest Ref Rng & Units 11/06/2017  Glucose 70 - 99 mg/dL 112(H)  BUN 8 - 23 mg/dL 17  Creatinine 0.44 - 1.00 mg/dL 1.32(H)  Sodium 135 - 145 mmol/L 137  Potassium 3.5 - 5.1 mmol/L 3.6  Chloride 98 - 111 mmol/L 104  CO2 22 - 32 mmol/L 23  Calcium 8.9 - 10.3 mg/dL 9.6  Total Protein 6.5 - 8.1 g/dL 7.5  Total Bilirubin  0.3 - 1.2 mg/dL 0.6  Alkaline Phos 38 - 126 U/L 69  AST 15 - 41 U/L 28  ALT 0 - 44 U/L 22   CBC Latest Ref Rng & Units 11/06/2017  WBC 3.6 - 11.0 K/uL 6.4  Hemoglobin 12.0 - 16.0 g/dL 13.2  Hematocrit 35.0 - 47.0 % 39.2  Platelets 150 - 440 K/uL 282    No images are attached to the encounter.  No results found.   Assessment and plan- Patient is a 67 y.o. female with stage II follicular lymphoma s/p 5 cycle sof rituxan and Rt. Currently under observation here for routine f/u  CT scan from Jan 2019 showed marked interval reduction in the size of adenopathy in left pelvis and groin. Minimal enlargement of left external iliac LN. No other progressive adenopathy. Her clinical exam is limited due to her body habitus and obesity. He cbc is normal. No palpable cervical, supraclavicular, axillary or inguinal adenopathy. I will plan to get repeat ct chest abdomen pelvis in 6 months from now. See her after scans with labs- cbc/cmp and LDH. She is on observation only at this time for her follicular lymphoma     Visit Diagnosis 1. Follicular lymphoma of intra-abdominal lymph nodes, unspecified grade ([place])   2. Encounter for follow-up surveillance of lymphoma      Dr. Randa Evens, MD, MPH [facility] at [facility] [phone] 11/10/2017 8:11 AM

## 2017-11-11 ENCOUNTER — Encounter: Payer: Self-pay | Admitting: Urology

## 2017-11-11 ENCOUNTER — Ambulatory Visit (INDEPENDENT_AMBULATORY_CARE_PROVIDER_SITE_OTHER): Payer: Medicare HMO | Admitting: Urology

## 2017-11-11 VITALS — BP 135/79 | HR 87 | Ht 64.0 in

## 2017-11-11 DIAGNOSIS — M5416 Radiculopathy, lumbar region: Secondary | ICD-10-CM | POA: Diagnosis not present

## 2017-11-11 DIAGNOSIS — L03116 Cellulitis of left lower limb: Secondary | ICD-10-CM | POA: Diagnosis not present

## 2017-11-11 DIAGNOSIS — N390 Urinary tract infection, site not specified: Secondary | ICD-10-CM

## 2017-11-11 DIAGNOSIS — J449 Chronic obstructive pulmonary disease, unspecified: Secondary | ICD-10-CM | POA: Diagnosis not present

## 2017-11-11 DIAGNOSIS — I129 Hypertensive chronic kidney disease with stage 1 through stage 4 chronic kidney disease, or unspecified chronic kidney disease: Secondary | ICD-10-CM | POA: Diagnosis not present

## 2017-11-11 DIAGNOSIS — L03115 Cellulitis of right lower limb: Secondary | ICD-10-CM | POA: Diagnosis not present

## 2017-11-11 DIAGNOSIS — N183 Chronic kidney disease, stage 3 (moderate): Secondary | ICD-10-CM | POA: Diagnosis not present

## 2017-11-11 DIAGNOSIS — G35 Multiple sclerosis: Secondary | ICD-10-CM | POA: Diagnosis not present

## 2017-11-11 DIAGNOSIS — I739 Peripheral vascular disease, unspecified: Secondary | ICD-10-CM | POA: Diagnosis not present

## 2017-11-11 DIAGNOSIS — I872 Venous insufficiency (chronic) (peripheral): Secondary | ICD-10-CM | POA: Diagnosis not present

## 2017-11-12 ENCOUNTER — Encounter: Payer: Self-pay | Admitting: Urology

## 2017-11-12 DIAGNOSIS — I129 Hypertensive chronic kidney disease with stage 1 through stage 4 chronic kidney disease, or unspecified chronic kidney disease: Secondary | ICD-10-CM | POA: Diagnosis not present

## 2017-11-12 DIAGNOSIS — M5416 Radiculopathy, lumbar region: Secondary | ICD-10-CM | POA: Diagnosis not present

## 2017-11-12 DIAGNOSIS — J449 Chronic obstructive pulmonary disease, unspecified: Secondary | ICD-10-CM | POA: Diagnosis not present

## 2017-11-12 DIAGNOSIS — L03116 Cellulitis of left lower limb: Secondary | ICD-10-CM | POA: Diagnosis not present

## 2017-11-12 DIAGNOSIS — G35 Multiple sclerosis: Secondary | ICD-10-CM | POA: Diagnosis not present

## 2017-11-12 DIAGNOSIS — N183 Chronic kidney disease, stage 3 (moderate): Secondary | ICD-10-CM | POA: Diagnosis not present

## 2017-11-12 DIAGNOSIS — I739 Peripheral vascular disease, unspecified: Secondary | ICD-10-CM | POA: Diagnosis not present

## 2017-11-12 DIAGNOSIS — I872 Venous insufficiency (chronic) (peripheral): Secondary | ICD-10-CM | POA: Diagnosis not present

## 2017-11-12 DIAGNOSIS — L03115 Cellulitis of right lower limb: Secondary | ICD-10-CM | POA: Diagnosis not present

## 2017-11-12 MED ORDER — SOLIFENACIN SUCCINATE 10 MG PO TABS: 10.0000 mg | ORAL_TABLET | Freq: Every day | ORAL | 11 refills | Status: DC

## 2017-11-12 NOTE — Progress Notes (Signed)
11/11/2017 7:20 AM   [name] June 14, 1950 [id number]  Referring provider: Arnetha Courser, MD [address] [place], [place] [address]  Chief Complaint  Patient presents with  . New Patient (Initial Visit)    HPI: 67 year old female with a neurogenic bladder secondary to multiple sclerosis.  She has urinary retention and is unable to perform intermittent catheterization.  She refused suprapubic tube drainage and is currently managed with an indwelling Foley catheter.  I last saw her at [facility] in February 2018.  She has been unable to establish care here and states her Foley catheter was last changed in April as she could not get in order to have it changed by home health.  Since I last saw her she was diagnosed with follicular lymphoma and has undergone treatment.  She was previously on Vesicare for bladder spasms and has run out of this medication.  She has been treated for several UTIs however it is difficult to ascertain if this was a true infection or colonization secondary to an indwelling catheter.  She denies fever or chills.   PMH: Past Medical History:  Diagnosis Date  . Abdominal aortic atherosclerosis ([place]) 11/11/2016  . ADHD   . Anxiety   . COPD (chronic obstructive pulmonary disease) ([place])   . Depression    major depressive  . Dyspnea    doe  . Edema    left leg  . Follicular lymphoma ([place])    B Cell  . Follicular lymphoma grade II ([place])   . Hypertension   . Hypotension    idiopathic  . Kyphoscoliosis and scoliosis 11/26/2011  . Morbid obesity ([place]) 01/05/2016  . Multiple sclerosis ([place])   . Multiple sclerosis ([place])    1980's  . Neuromuscular disorder ([place])   . Obstructive and reflux uropathy    foley  . Pain    atypical facial  . Peripheral vascular disease of lower extremity with ulceration ([place]) 11/08/2015  . Skin ulcer ([place]) 11/08/2015  . Weakness    generalized. has MS    Surgical History: Past Surgical History:  Procedure Laterality  Date  . BACK SURGERY N/A 2002  . CYST EXCISION     lower back  . INGUINAL LYMPH NODE BIOPSY Left 07/04/2016   Procedure: INGUINAL LYMPH NODE BIOPSY;  Surgeon: Christene Lye, MD;  Location: [facility] ORS;  Service: General;  Laterality: Left;  . PORTACATH PLACEMENT N/A 07/22/2016   Procedure: INSERTION PORT-A-CATH;  Surgeon: Christene Lye, MD;  Location: [facility] ORS;  Service: General;  Laterality: N/A;  . TONSILLECTOMY AND ADENOIDECTOMY    . TUBAL LIGATION      Home Medications:  Allergies as of 11/11/2017   No Known Allergies     Medication List        Accurate as of 11/11/17 11:59 PM. Always use your most recent med list.          BIOTIN PO Take by mouth daily. Pt taking 500 mcg daily   buPROPion 300 MG 24 hr tablet Commonly known as:  WELLBUTRIN XL Take 300 mg by mouth every morning.   clonazePAM 1 MG tablet Commonly known as:  KLONOPIN Take 1 tablet (1 mg total) by mouth at bedtime.   DULoxetine 60 MG capsule Commonly known as:  CYMBALTA Take 1 capsule (60 mg total) by mouth every morning.   furosemide 20 MG tablet Commonly known as:  LASIX Take 1 tablet (20 mg total) by mouth daily. STOP HCTZ   gabapentin  600 MG tablet Commonly known as:  NEURONTIN Take 1 tablet (600 mg total) by mouth 3 (three) times daily.   HUGGIES PULL-UPS Misc 1 each by Does not apply route 2 (two) times daily. Dx urinary incontinence, MS; LON 99 months   hydrALAZINE 50 MG tablet Commonly known as:  APRESOLINE Take 1 tablet (50 mg total) by mouth 3 (three) times daily.   interferon beta-1a 30 MCG/0.5ML Pskt injection Commonly known as:  AVONEX Inject 30 mcg into the muscle every Monday.   lactobacillus acidophilus & bulgar chewable tablet Chew 1 tablet by mouth 3 (three) times daily with meals.   lisinopril 20 MG tablet Commonly known as:  PRINIVIL,ZESTRIL Take 1 tablet (20 mg total) by mouth daily.   methylphenidate 10 MG tablet Commonly known as:  RITALIN Take 1  tablet (10 mg total) by mouth 2 (two) times daily.   multivitamin with minerals Tabs tablet Take 1 tablet by mouth every evening.   polyethylene glycol powder powder Commonly known as:  GLYCOLAX/MIRALAX Take 17 g by mouth daily as needed for mild constipation. Mixed in water   QUEtiapine Fumarate 150 MG 24 hr tablet Commonly known as:  SEROQUEL XR Take 150 mg by mouth at bedtime.   tiZANidine 4 MG tablet Commonly known as:  ZANAFLEX Take 2 tablets by mouth 2 (two) times daily.   topiramate 50 MG tablet Commonly known as:  TOPAMAX Take 1 tablet (50 mg total) by mouth daily.   vitamin C 500 MG tablet Commonly known as:  ASCORBIC ACID Take 500 mg by mouth every evening.       Allergies: No Known Allergies  Family History: Family History  Problem Relation Age of Onset  . COPD Mother   . Diabetes Mother   . Heart failure Mother   . Alcohol abuse Father   . Kidney disease Father   . Kidney failure Father   . Arthritis Sister   . CAD Maternal Grandmother   . Stroke Maternal Grandfather   . Arthritis Sister   . Mental illness Sister   . Arthritis Brother     Social History:  reports that she quit smoking about 21 months ago. Her smoking use included cigarettes. She started smoking about 22 years ago. She has a 20.00 pack-year smoking history. She has never used smokeless tobacco. She reports that she has current or past drug history. Drug: Marijuana. She reports that she does not drink alcohol.  ROS: UROLOGY Frequent Urination?: No Hard to postpone urination?: No Burning/pain with urination?: No Get up at night to urinate?: No Leakage of urine?: No Urine stream starts and stops?: No Trouble starting stream?: No Do you have to strain to urinate?: No Blood in urine?: No Urinary tract infection?: Yes Sexually transmitted disease?: No Injury to kidneys or bladder?: No Painful intercourse?: No Weak stream?: No Currently pregnant?: No Vaginal bleeding?:  No  Gastrointestinal Nausea?: No Vomiting?: No Indigestion/heartburn?: No Diarrhea?: No Constipation?: Yes  Constitutional Fever: No Night sweats?: No Weight loss?: No Fatigue?: Yes  Skin Skin rash/lesions?: No Itching?: No  Eyes Blurred vision?: No Double vision?: No  Ears/Nose/Throat Sore throat?: No Sinus problems?: No  Hematologic/Lymphatic Swollen glands?: No Easy bruising?: No  Cardiovascular Leg swelling?: Yes Chest pain?: No  Respiratory Cough?: No Shortness of breath?: Yes  Endocrine Excessive thirst?: Yes  Musculoskeletal Back pain?: Yes Joint pain?: Yes  Neurological Headaches?: Yes Dizziness?: Yes  Psychologic Depression?: Yes Anxiety?: Yes  Physical Exam: BP 135/79   Pulse 87  Ht 5\' 4"  (1.626 m)   BMI 51.91 kg/m   Constitutional: In wheelchair, alert and oriented, No acute distress. HEENT: [place] AT, moist mucus membranes.  Trachea midline, no masses. Cardiovascular: No clubbing, cyanosis, or edema. Respiratory: Normal respiratory effort, no increased work of breathing. GI: Abdomen is soft, nontender, nondistended, no abdominal masses GU: No CVA tenderness.  Foley catheter draining clear urine Lymph: No cervical or inguinal lymphadenopathy. Skin: No rashes, bruises or suspicious lesions. Neurologic: Grossly intact, no focal deficits, moving all 4 extremities. Psychiatric: Normal mood and affect.   Assessment & Plan:   66 year old female with a hypotonic neurogenic bladder secondary to multiple sclerosis.  An order was sent to her home health agency to change her Foley catheter monthly.  Rx Vesicare was sent to her pharmacy.  Recommend a 76-month follow-up.  She had a CT of the abdomen pelvis performed in January 2019 which showed no hydronephrosis or upper tract changes.   Abbie Sons, [place] [address] [place], [place] [address] [phone]

## 2017-11-13 DIAGNOSIS — I739 Peripheral vascular disease, unspecified: Secondary | ICD-10-CM | POA: Diagnosis not present

## 2017-11-13 DIAGNOSIS — I129 Hypertensive chronic kidney disease with stage 1 through stage 4 chronic kidney disease, or unspecified chronic kidney disease: Secondary | ICD-10-CM | POA: Diagnosis not present

## 2017-11-13 DIAGNOSIS — L03115 Cellulitis of right lower limb: Secondary | ICD-10-CM | POA: Diagnosis not present

## 2017-11-13 DIAGNOSIS — N183 Chronic kidney disease, stage 3 (moderate): Secondary | ICD-10-CM | POA: Diagnosis not present

## 2017-11-13 DIAGNOSIS — L03116 Cellulitis of left lower limb: Secondary | ICD-10-CM | POA: Diagnosis not present

## 2017-11-13 DIAGNOSIS — J449 Chronic obstructive pulmonary disease, unspecified: Secondary | ICD-10-CM | POA: Diagnosis not present

## 2017-11-13 DIAGNOSIS — M5416 Radiculopathy, lumbar region: Secondary | ICD-10-CM | POA: Diagnosis not present

## 2017-11-13 DIAGNOSIS — I872 Venous insufficiency (chronic) (peripheral): Secondary | ICD-10-CM | POA: Diagnosis not present

## 2017-11-13 DIAGNOSIS — G35 Multiple sclerosis: Secondary | ICD-10-CM | POA: Diagnosis not present

## 2017-11-16 LAB — CULTURE, URINE COMPREHENSIVE

## 2017-11-17 ENCOUNTER — Telehealth: Payer: Self-pay

## 2017-11-17 NOTE — Telephone Encounter (Signed)
-----   Message from Abbie Sons, MD sent at 11/16/2017  4:57 PM EDT ----- Urine culture grew multiple bacteria.  She has a chronic catheter and this most likely represents colonization.  Would not treat unless having significant UTI symptoms.

## 2017-11-17 NOTE — Telephone Encounter (Signed)
Pt informed

## 2017-11-18 ENCOUNTER — Telehealth: Payer: Self-pay

## 2017-11-18 NOTE — Telephone Encounter (Signed)
Pt's insurance will not cover Vesicare, the medications on their preferred list are oxybutynin, toviaz, and mybetriq. Please recommend.

## 2017-11-19 DIAGNOSIS — L03116 Cellulitis of left lower limb: Secondary | ICD-10-CM | POA: Diagnosis not present

## 2017-11-19 DIAGNOSIS — M5416 Radiculopathy, lumbar region: Secondary | ICD-10-CM | POA: Diagnosis not present

## 2017-11-19 DIAGNOSIS — I872 Venous insufficiency (chronic) (peripheral): Secondary | ICD-10-CM | POA: Diagnosis not present

## 2017-11-19 DIAGNOSIS — N183 Chronic kidney disease, stage 3 (moderate): Secondary | ICD-10-CM | POA: Diagnosis not present

## 2017-11-19 DIAGNOSIS — I129 Hypertensive chronic kidney disease with stage 1 through stage 4 chronic kidney disease, or unspecified chronic kidney disease: Secondary | ICD-10-CM | POA: Diagnosis not present

## 2017-11-19 DIAGNOSIS — G35 Multiple sclerosis: Secondary | ICD-10-CM | POA: Diagnosis not present

## 2017-11-19 DIAGNOSIS — J449 Chronic obstructive pulmonary disease, unspecified: Secondary | ICD-10-CM | POA: Diagnosis not present

## 2017-11-19 DIAGNOSIS — I739 Peripheral vascular disease, unspecified: Secondary | ICD-10-CM | POA: Diagnosis not present

## 2017-11-19 DIAGNOSIS — L03115 Cellulitis of right lower limb: Secondary | ICD-10-CM | POA: Diagnosis not present

## 2017-11-19 MED ORDER — MIRABEGRON ER 50 MG PO TB24: 50.0000 mg | ORAL_TABLET | Freq: Every day | ORAL | 11 refills | Status: DC

## 2017-11-19 NOTE — Telephone Encounter (Signed)
Rx Myrbetriq sent.

## 2017-11-20 ENCOUNTER — Telehealth: Payer: Self-pay | Admitting: Urology

## 2017-11-20 NOTE — Telephone Encounter (Signed)
Pt called and left message about an Rx her pharmacy would not fill without a prior auth.  Pt did not indicate which medication.  Please give her a call [phone]

## 2017-11-20 NOTE — Telephone Encounter (Signed)
I called pt and spoke with her, per her last telephone encounter, the pt was switched to Myrbetriq, which is a preferred drug for her insurance. Pt instructed to call pharmacy to verify that this rx is being covered and can be picked up.

## 2017-11-21 ENCOUNTER — Telehealth: Payer: Self-pay

## 2017-11-21 DIAGNOSIS — G35 Multiple sclerosis: Secondary | ICD-10-CM | POA: Diagnosis not present

## 2017-11-21 DIAGNOSIS — J449 Chronic obstructive pulmonary disease, unspecified: Secondary | ICD-10-CM | POA: Diagnosis not present

## 2017-11-21 DIAGNOSIS — L03116 Cellulitis of left lower limb: Secondary | ICD-10-CM | POA: Diagnosis not present

## 2017-11-21 DIAGNOSIS — I872 Venous insufficiency (chronic) (peripheral): Secondary | ICD-10-CM | POA: Diagnosis not present

## 2017-11-21 DIAGNOSIS — I129 Hypertensive chronic kidney disease with stage 1 through stage 4 chronic kidney disease, or unspecified chronic kidney disease: Secondary | ICD-10-CM | POA: Diagnosis not present

## 2017-11-21 DIAGNOSIS — N183 Chronic kidney disease, stage 3 (moderate): Secondary | ICD-10-CM | POA: Diagnosis not present

## 2017-11-21 DIAGNOSIS — I739 Peripheral vascular disease, unspecified: Secondary | ICD-10-CM | POA: Diagnosis not present

## 2017-11-21 DIAGNOSIS — M5416 Radiculopathy, lumbar region: Secondary | ICD-10-CM | POA: Diagnosis not present

## 2017-11-21 DIAGNOSIS — L03115 Cellulitis of right lower limb: Secondary | ICD-10-CM | POA: Diagnosis not present

## 2017-11-21 NOTE — Telephone Encounter (Signed)
Order for home health monthly cath changes faxed to [facility].

## 2017-11-24 ENCOUNTER — Telehealth: Payer: Self-pay | Admitting: Urology

## 2017-11-24 NOTE — Telephone Encounter (Signed)
Pt's caregiver called and asked about bacteria in pt's urine.  Abx was not prescribed for this.  Pt is not being her normal self and they think maybe she has a UTI.  Please call [phone] [facility]

## 2017-11-25 DIAGNOSIS — M5416 Radiculopathy, lumbar region: Secondary | ICD-10-CM | POA: Diagnosis not present

## 2017-11-25 DIAGNOSIS — L03116 Cellulitis of left lower limb: Secondary | ICD-10-CM | POA: Diagnosis not present

## 2017-11-25 DIAGNOSIS — I872 Venous insufficiency (chronic) (peripheral): Secondary | ICD-10-CM | POA: Diagnosis not present

## 2017-11-25 DIAGNOSIS — L03115 Cellulitis of right lower limb: Secondary | ICD-10-CM | POA: Diagnosis not present

## 2017-11-25 DIAGNOSIS — N183 Chronic kidney disease, stage 3 (moderate): Secondary | ICD-10-CM | POA: Diagnosis not present

## 2017-11-25 DIAGNOSIS — I129 Hypertensive chronic kidney disease with stage 1 through stage 4 chronic kidney disease, or unspecified chronic kidney disease: Secondary | ICD-10-CM | POA: Diagnosis not present

## 2017-11-25 DIAGNOSIS — J449 Chronic obstructive pulmonary disease, unspecified: Secondary | ICD-10-CM | POA: Diagnosis not present

## 2017-11-25 DIAGNOSIS — G35 Multiple sclerosis: Secondary | ICD-10-CM | POA: Diagnosis not present

## 2017-11-25 DIAGNOSIS — I739 Peripheral vascular disease, unspecified: Secondary | ICD-10-CM | POA: Diagnosis not present

## 2017-11-25 NOTE — Telephone Encounter (Signed)
Called and left message for [name] to call office. I spoke with pt directly, she states she is feeling fine and displaying no sx's.

## 2017-11-26 ENCOUNTER — Ambulatory Visit: Payer: 59 | Admitting: Urology

## 2017-11-27 DIAGNOSIS — J449 Chronic obstructive pulmonary disease, unspecified: Secondary | ICD-10-CM | POA: Diagnosis not present

## 2017-11-27 DIAGNOSIS — I129 Hypertensive chronic kidney disease with stage 1 through stage 4 chronic kidney disease, or unspecified chronic kidney disease: Secondary | ICD-10-CM | POA: Diagnosis not present

## 2017-11-27 DIAGNOSIS — I739 Peripheral vascular disease, unspecified: Secondary | ICD-10-CM | POA: Diagnosis not present

## 2017-11-27 DIAGNOSIS — N183 Chronic kidney disease, stage 3 (moderate): Secondary | ICD-10-CM | POA: Diagnosis not present

## 2017-11-27 DIAGNOSIS — I872 Venous insufficiency (chronic) (peripheral): Secondary | ICD-10-CM | POA: Diagnosis not present

## 2017-11-27 DIAGNOSIS — L03116 Cellulitis of left lower limb: Secondary | ICD-10-CM | POA: Diagnosis not present

## 2017-11-27 DIAGNOSIS — L03115 Cellulitis of right lower limb: Secondary | ICD-10-CM | POA: Diagnosis not present

## 2017-11-27 DIAGNOSIS — M5416 Radiculopathy, lumbar region: Secondary | ICD-10-CM | POA: Diagnosis not present

## 2017-11-27 DIAGNOSIS — G35 Multiple sclerosis: Secondary | ICD-10-CM | POA: Diagnosis not present

## 2017-11-28 ENCOUNTER — Ambulatory Visit: Payer: Self-pay | Admitting: Family Medicine

## 2017-11-28 DIAGNOSIS — L03116 Cellulitis of left lower limb: Secondary | ICD-10-CM | POA: Diagnosis not present

## 2017-11-28 DIAGNOSIS — I739 Peripheral vascular disease, unspecified: Secondary | ICD-10-CM | POA: Diagnosis not present

## 2017-11-28 DIAGNOSIS — L03115 Cellulitis of right lower limb: Secondary | ICD-10-CM | POA: Diagnosis not present

## 2017-11-28 DIAGNOSIS — M5416 Radiculopathy, lumbar region: Secondary | ICD-10-CM | POA: Diagnosis not present

## 2017-11-28 DIAGNOSIS — G35 Multiple sclerosis: Secondary | ICD-10-CM | POA: Diagnosis not present

## 2017-11-28 DIAGNOSIS — I872 Venous insufficiency (chronic) (peripheral): Secondary | ICD-10-CM | POA: Diagnosis not present

## 2017-11-28 DIAGNOSIS — N183 Chronic kidney disease, stage 3 (moderate): Secondary | ICD-10-CM | POA: Diagnosis not present

## 2017-11-28 DIAGNOSIS — I129 Hypertensive chronic kidney disease with stage 1 through stage 4 chronic kidney disease, or unspecified chronic kidney disease: Secondary | ICD-10-CM | POA: Diagnosis not present

## 2017-11-28 DIAGNOSIS — J449 Chronic obstructive pulmonary disease, unspecified: Secondary | ICD-10-CM | POA: Diagnosis not present

## 2017-11-28 NOTE — Telephone Encounter (Signed)
Patient has pulled catheter out and had it reinserted today.Patient states she took BP medication today about 1/2 hour ago. Reports slight headache. Call to office- conferenced [facility] provider with office for further instruction.   Reason for Disposition . [7] Systolic BP  >= 510 OR Diastolic >= 258 AND [5] cardiac or neurologic symptoms (e.g., chest pain, difficulty breathing, unsteady gait, blurred vision)  Answer Assessment - Initial Assessment Questions 1. BLOOD PRESSURE: "What is the blood pressure?" "Did you take at least two measurements 5 minutes apart?"     190/100   206/100 2. ONSET: "When did you take your blood pressure?"     3:00 3. HOW: "How did you obtain the blood pressure?" (e.g., visiting nurse, automatic home BP monitor)     Manual cuff 4. HISTORY: "Do you have a history of high blood pressure?"     yes 5. MEDICATIONS: "Are you taking any medications for blood pressure?" "Have you missed any doses recently?"     Patient takes BP medications- missed pills 6. OTHER SYMPTOMS: "Do you have any symptoms?" (e.g., headache, chest pain, blurred vision, difficulty breathing, weakness)     headache 7. PREGNANCY: "Is there any chance you are pregnant?" "When was your last menstrual period?"     n/a  Protocols used: HIGH BLOOD PRESSURE-A-AH

## 2017-11-28 NOTE — Telephone Encounter (Signed)
James with [facility] called concerned about patient's blood pressure. Currently 190/100.  Patient denies any chest pain, but does have slight headache and feels stressed out today.  Dr. Sanda Klein recommends extra dose of Hydralizine 100mg .  If patient's bp does not come down have patient call after hours doctor on call

## 2017-12-01 DIAGNOSIS — L03115 Cellulitis of right lower limb: Secondary | ICD-10-CM | POA: Diagnosis not present

## 2017-12-01 DIAGNOSIS — G35 Multiple sclerosis: Secondary | ICD-10-CM | POA: Diagnosis not present

## 2017-12-01 DIAGNOSIS — L03116 Cellulitis of left lower limb: Secondary | ICD-10-CM | POA: Diagnosis not present

## 2017-12-01 DIAGNOSIS — N183 Chronic kidney disease, stage 3 (moderate): Secondary | ICD-10-CM | POA: Diagnosis not present

## 2017-12-01 DIAGNOSIS — I872 Venous insufficiency (chronic) (peripheral): Secondary | ICD-10-CM | POA: Diagnosis not present

## 2017-12-01 DIAGNOSIS — I739 Peripheral vascular disease, unspecified: Secondary | ICD-10-CM | POA: Diagnosis not present

## 2017-12-01 DIAGNOSIS — I129 Hypertensive chronic kidney disease with stage 1 through stage 4 chronic kidney disease, or unspecified chronic kidney disease: Secondary | ICD-10-CM | POA: Diagnosis not present

## 2017-12-01 DIAGNOSIS — M5416 Radiculopathy, lumbar region: Secondary | ICD-10-CM | POA: Diagnosis not present

## 2017-12-01 DIAGNOSIS — J449 Chronic obstructive pulmonary disease, unspecified: Secondary | ICD-10-CM | POA: Diagnosis not present

## 2017-12-02 DIAGNOSIS — G35 Multiple sclerosis: Secondary | ICD-10-CM | POA: Diagnosis not present

## 2017-12-02 DIAGNOSIS — I739 Peripheral vascular disease, unspecified: Secondary | ICD-10-CM | POA: Diagnosis not present

## 2017-12-02 DIAGNOSIS — L03115 Cellulitis of right lower limb: Secondary | ICD-10-CM | POA: Diagnosis not present

## 2017-12-02 DIAGNOSIS — N183 Chronic kidney disease, stage 3 (moderate): Secondary | ICD-10-CM | POA: Diagnosis not present

## 2017-12-02 DIAGNOSIS — M5416 Radiculopathy, lumbar region: Secondary | ICD-10-CM | POA: Diagnosis not present

## 2017-12-02 DIAGNOSIS — L03116 Cellulitis of left lower limb: Secondary | ICD-10-CM | POA: Diagnosis not present

## 2017-12-02 DIAGNOSIS — I129 Hypertensive chronic kidney disease with stage 1 through stage 4 chronic kidney disease, or unspecified chronic kidney disease: Secondary | ICD-10-CM | POA: Diagnosis not present

## 2017-12-02 DIAGNOSIS — J449 Chronic obstructive pulmonary disease, unspecified: Secondary | ICD-10-CM | POA: Diagnosis not present

## 2017-12-02 DIAGNOSIS — I872 Venous insufficiency (chronic) (peripheral): Secondary | ICD-10-CM | POA: Diagnosis not present

## 2017-12-03 DIAGNOSIS — L03115 Cellulitis of right lower limb: Secondary | ICD-10-CM | POA: Diagnosis not present

## 2017-12-03 DIAGNOSIS — N183 Chronic kidney disease, stage 3 (moderate): Secondary | ICD-10-CM | POA: Diagnosis not present

## 2017-12-03 DIAGNOSIS — I872 Venous insufficiency (chronic) (peripheral): Secondary | ICD-10-CM | POA: Diagnosis not present

## 2017-12-03 DIAGNOSIS — I129 Hypertensive chronic kidney disease with stage 1 through stage 4 chronic kidney disease, or unspecified chronic kidney disease: Secondary | ICD-10-CM | POA: Diagnosis not present

## 2017-12-03 DIAGNOSIS — G35 Multiple sclerosis: Secondary | ICD-10-CM | POA: Diagnosis not present

## 2017-12-03 DIAGNOSIS — L03116 Cellulitis of left lower limb: Secondary | ICD-10-CM | POA: Diagnosis not present

## 2017-12-03 DIAGNOSIS — M5416 Radiculopathy, lumbar region: Secondary | ICD-10-CM | POA: Diagnosis not present

## 2017-12-03 DIAGNOSIS — J449 Chronic obstructive pulmonary disease, unspecified: Secondary | ICD-10-CM | POA: Diagnosis not present

## 2017-12-03 DIAGNOSIS — I739 Peripheral vascular disease, unspecified: Secondary | ICD-10-CM | POA: Diagnosis not present

## 2017-12-05 ENCOUNTER — Telehealth: Payer: Self-pay

## 2017-12-05 NOTE — Telephone Encounter (Signed)
Please just document the reason / diagnosis and then that's fine Thank you

## 2017-12-05 NOTE — Telephone Encounter (Signed)
Continuing goals for ADL's and transfer, dx weakness

## 2017-12-05 NOTE — Telephone Encounter (Signed)
Copied from [place] [id number]. Topic: General - Other >> Dec 03, 2017  3:56 PM [place], Valda Favia wrote: Colletta [place] from [facility] is calling in for verbal orders for occupational therapy 2 times a week for 2 weeks  Cb# [phone]

## 2017-12-10 DIAGNOSIS — G35 Multiple sclerosis: Secondary | ICD-10-CM | POA: Diagnosis not present

## 2017-12-10 DIAGNOSIS — L03116 Cellulitis of left lower limb: Secondary | ICD-10-CM | POA: Diagnosis not present

## 2017-12-10 DIAGNOSIS — I739 Peripheral vascular disease, unspecified: Secondary | ICD-10-CM | POA: Diagnosis not present

## 2017-12-10 DIAGNOSIS — J449 Chronic obstructive pulmonary disease, unspecified: Secondary | ICD-10-CM | POA: Diagnosis not present

## 2017-12-10 DIAGNOSIS — I129 Hypertensive chronic kidney disease with stage 1 through stage 4 chronic kidney disease, or unspecified chronic kidney disease: Secondary | ICD-10-CM | POA: Diagnosis not present

## 2017-12-10 DIAGNOSIS — I872 Venous insufficiency (chronic) (peripheral): Secondary | ICD-10-CM | POA: Diagnosis not present

## 2017-12-10 DIAGNOSIS — N183 Chronic kidney disease, stage 3 (moderate): Secondary | ICD-10-CM | POA: Diagnosis not present

## 2017-12-10 DIAGNOSIS — M5416 Radiculopathy, lumbar region: Secondary | ICD-10-CM | POA: Diagnosis not present

## 2017-12-10 DIAGNOSIS — L03115 Cellulitis of right lower limb: Secondary | ICD-10-CM | POA: Diagnosis not present

## 2017-12-12 DIAGNOSIS — I129 Hypertensive chronic kidney disease with stage 1 through stage 4 chronic kidney disease, or unspecified chronic kidney disease: Secondary | ICD-10-CM | POA: Diagnosis not present

## 2017-12-12 DIAGNOSIS — G35 Multiple sclerosis: Secondary | ICD-10-CM | POA: Diagnosis not present

## 2017-12-12 DIAGNOSIS — I872 Venous insufficiency (chronic) (peripheral): Secondary | ICD-10-CM | POA: Diagnosis not present

## 2017-12-12 DIAGNOSIS — I739 Peripheral vascular disease, unspecified: Secondary | ICD-10-CM | POA: Diagnosis not present

## 2017-12-12 DIAGNOSIS — J449 Chronic obstructive pulmonary disease, unspecified: Secondary | ICD-10-CM | POA: Diagnosis not present

## 2017-12-12 DIAGNOSIS — M5416 Radiculopathy, lumbar region: Secondary | ICD-10-CM | POA: Diagnosis not present

## 2017-12-12 DIAGNOSIS — N183 Chronic kidney disease, stage 3 (moderate): Secondary | ICD-10-CM | POA: Diagnosis not present

## 2017-12-12 DIAGNOSIS — L03116 Cellulitis of left lower limb: Secondary | ICD-10-CM | POA: Diagnosis not present

## 2017-12-12 DIAGNOSIS — L03115 Cellulitis of right lower limb: Secondary | ICD-10-CM | POA: Diagnosis not present

## 2017-12-12 IMAGING — CT CT BIOPSY AND ASPIRATION BONE MARROW
2 series · 10 of 14 positions shown, 12 images · non-contrast
Comparison: none

INDICATION: Concern for lymphoma. Please perform CT-guided bone marrow biopsy
and aspiration for tissue diagnostic purposes.

[Series 2: i-spiral 5.0 b30f · axial · 0.71mm/px · z∈[-362,-302]mm · 7 of 23 slices shown, 9 images]
[im 3/23  soft-tissue]
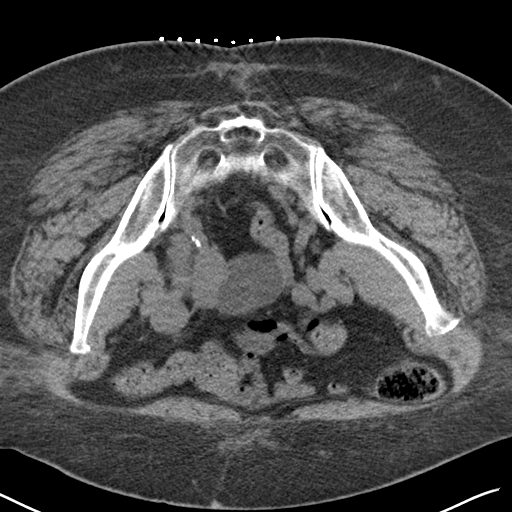
[im 3/23  bone]
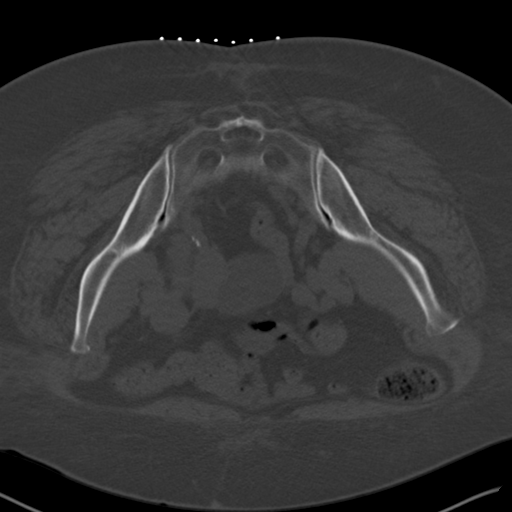
[im 6/23  bone]
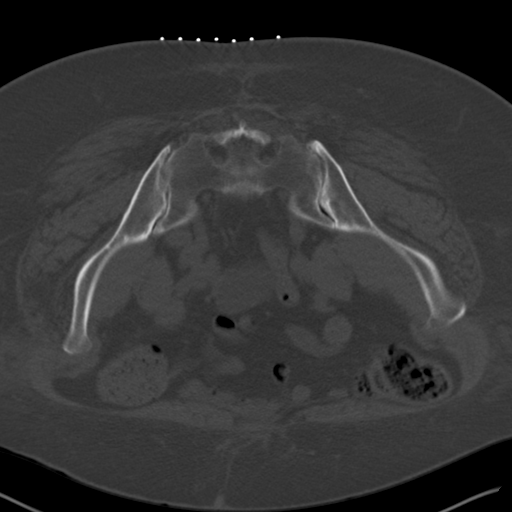
[im 9/23  bone]
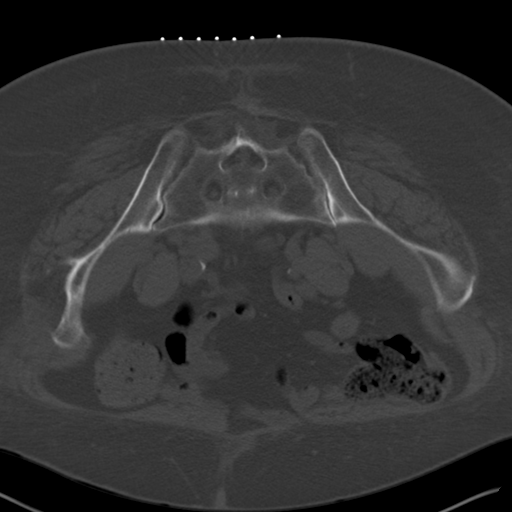
[im 12/23  bone]
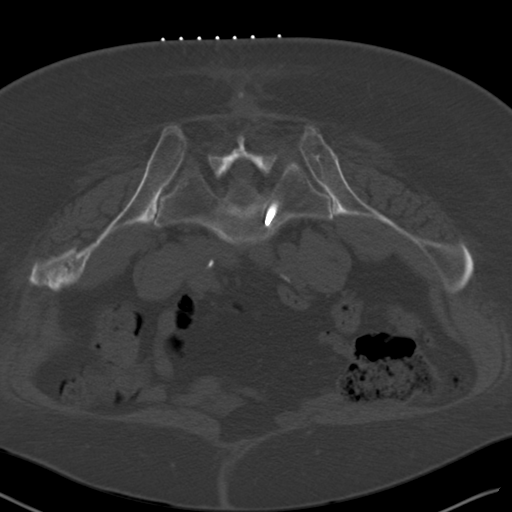
[im 14/23  soft-tissue]
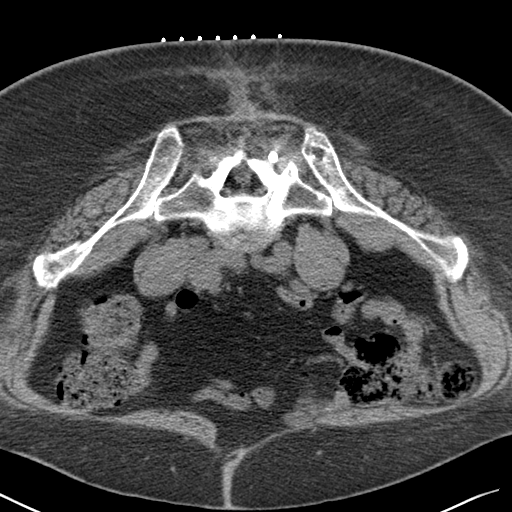
[im 14/23  bone]
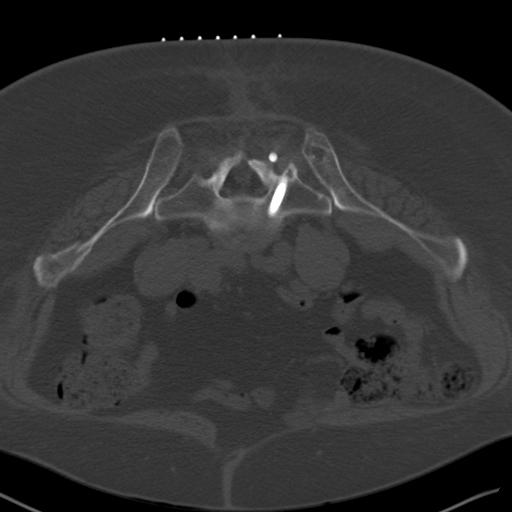
[im 17/23  bone]
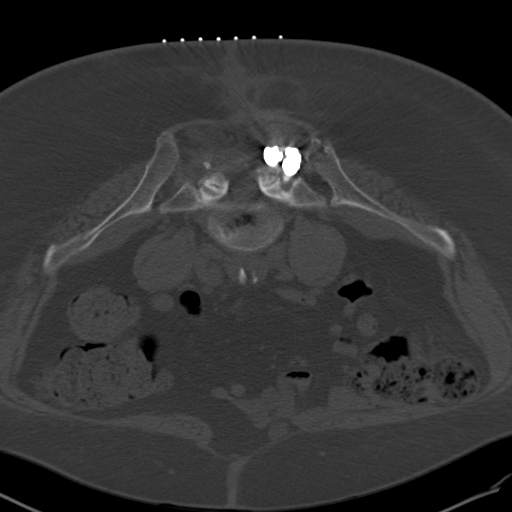
[im 20/23  bone]
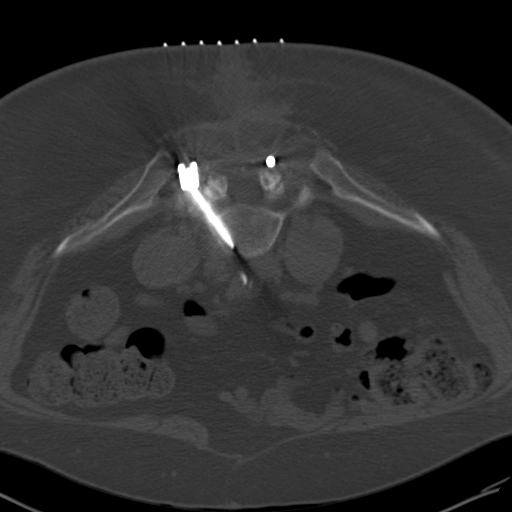

[Series 3: i-sequence 2.4 b30s · axial · 0.71mm/px · z∈[-333,-326]mm · 3 of 12 slices shown]
[im 3/12  bone]
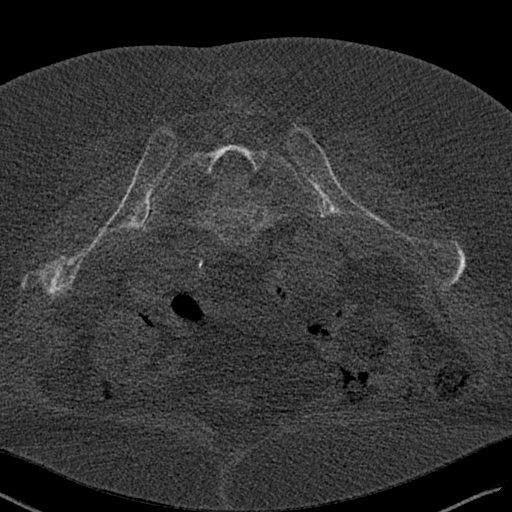
[im 6/12  bone]
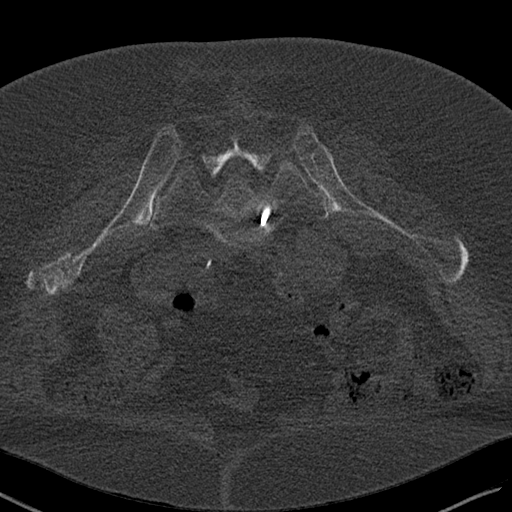
[im 9/12  bone]
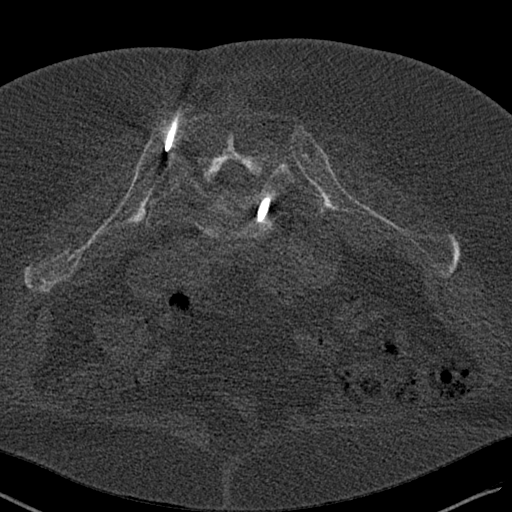

[10 of 14 positions shown; findings below may reference images not displayed]

EXAM:
CT-GUIDED BONE MARROW BIOPSY AND ASPIRATION

MEDICATIONS:
None

ANESTHESIA/SEDATION:
Fentanyl 50 mcg IV; Versed 1 mg IV

Sedation Time: 12 minutes; The patient was continuously monitored
during the procedure by the interventional radiology nurse under my
direct supervision.

COMPLICATIONS:
None immediate.

PROCEDURE:
Informed consent was obtained from the patient following an
explanation of the procedure, risks, benefits and alternatives. The
patient understands, agrees and consents for the procedure. All
questions were addressed. A time out was performed prior to the
initiation of the procedure. The patient was positioned prone and
non-contrast localization CT was performed of the pelvis to
demonstrate the iliac marrow spaces. The operative site was prepped
and draped in the usual sterile fashion.

Under sterile conditions and local anesthesia, a 22 gauge spinal
needle was utilized for procedural planning. Next, an 11 gauge
coaxial bone biopsy needle was advanced into the left iliac marrow
space. Needle position was confirmed with CT imaging. Initially,
bone marrow aspiration was performed. Next, a bone marrow biopsy was
obtained with the 11 gauge outer bone marrow device. Samples were
prepared with the cytotechnologist and deemed adequate. The needle
was removed intact. Hemostasis was obtained with compression and a
dressing was placed. The patient tolerated the procedure well
without immediate post procedural complication.
IMPRESSION: Successful CT guided left iliac bone marrow aspiration and core
biopsy.

## 2017-12-15 DIAGNOSIS — I739 Peripheral vascular disease, unspecified: Secondary | ICD-10-CM | POA: Diagnosis not present

## 2017-12-15 DIAGNOSIS — L03116 Cellulitis of left lower limb: Secondary | ICD-10-CM | POA: Diagnosis not present

## 2017-12-15 DIAGNOSIS — N183 Chronic kidney disease, stage 3 (moderate): Secondary | ICD-10-CM | POA: Diagnosis not present

## 2017-12-15 DIAGNOSIS — M5416 Radiculopathy, lumbar region: Secondary | ICD-10-CM | POA: Diagnosis not present

## 2017-12-15 DIAGNOSIS — L03115 Cellulitis of right lower limb: Secondary | ICD-10-CM | POA: Diagnosis not present

## 2017-12-15 DIAGNOSIS — J449 Chronic obstructive pulmonary disease, unspecified: Secondary | ICD-10-CM | POA: Diagnosis not present

## 2017-12-15 DIAGNOSIS — I872 Venous insufficiency (chronic) (peripheral): Secondary | ICD-10-CM | POA: Diagnosis not present

## 2017-12-15 DIAGNOSIS — I129 Hypertensive chronic kidney disease with stage 1 through stage 4 chronic kidney disease, or unspecified chronic kidney disease: Secondary | ICD-10-CM | POA: Diagnosis not present

## 2017-12-15 DIAGNOSIS — G35 Multiple sclerosis: Secondary | ICD-10-CM | POA: Diagnosis not present

## 2017-12-16 DIAGNOSIS — I739 Peripheral vascular disease, unspecified: Secondary | ICD-10-CM | POA: Diagnosis not present

## 2017-12-16 DIAGNOSIS — M5416 Radiculopathy, lumbar region: Secondary | ICD-10-CM | POA: Diagnosis not present

## 2017-12-16 DIAGNOSIS — I129 Hypertensive chronic kidney disease with stage 1 through stage 4 chronic kidney disease, or unspecified chronic kidney disease: Secondary | ICD-10-CM | POA: Diagnosis not present

## 2017-12-16 DIAGNOSIS — J449 Chronic obstructive pulmonary disease, unspecified: Secondary | ICD-10-CM | POA: Diagnosis not present

## 2017-12-16 DIAGNOSIS — I872 Venous insufficiency (chronic) (peripheral): Secondary | ICD-10-CM | POA: Diagnosis not present

## 2017-12-16 DIAGNOSIS — L03116 Cellulitis of left lower limb: Secondary | ICD-10-CM | POA: Diagnosis not present

## 2017-12-16 DIAGNOSIS — G35 Multiple sclerosis: Secondary | ICD-10-CM | POA: Diagnosis not present

## 2017-12-16 DIAGNOSIS — N183 Chronic kidney disease, stage 3 (moderate): Secondary | ICD-10-CM | POA: Diagnosis not present

## 2017-12-16 DIAGNOSIS — L03115 Cellulitis of right lower limb: Secondary | ICD-10-CM | POA: Diagnosis not present

## 2017-12-17 DIAGNOSIS — I872 Venous insufficiency (chronic) (peripheral): Secondary | ICD-10-CM | POA: Diagnosis not present

## 2017-12-17 DIAGNOSIS — G35 Multiple sclerosis: Secondary | ICD-10-CM | POA: Diagnosis not present

## 2017-12-17 DIAGNOSIS — L03115 Cellulitis of right lower limb: Secondary | ICD-10-CM | POA: Diagnosis not present

## 2017-12-17 DIAGNOSIS — N183 Chronic kidney disease, stage 3 (moderate): Secondary | ICD-10-CM | POA: Diagnosis not present

## 2017-12-17 DIAGNOSIS — J449 Chronic obstructive pulmonary disease, unspecified: Secondary | ICD-10-CM | POA: Diagnosis not present

## 2017-12-17 DIAGNOSIS — L03116 Cellulitis of left lower limb: Secondary | ICD-10-CM | POA: Diagnosis not present

## 2017-12-17 DIAGNOSIS — I129 Hypertensive chronic kidney disease with stage 1 through stage 4 chronic kidney disease, or unspecified chronic kidney disease: Secondary | ICD-10-CM | POA: Diagnosis not present

## 2017-12-17 DIAGNOSIS — I739 Peripheral vascular disease, unspecified: Secondary | ICD-10-CM | POA: Diagnosis not present

## 2017-12-17 DIAGNOSIS — M5416 Radiculopathy, lumbar region: Secondary | ICD-10-CM | POA: Diagnosis not present

## 2017-12-22 ENCOUNTER — Telehealth: Payer: Self-pay | Admitting: Family Medicine

## 2017-12-22 NOTE — Telephone Encounter (Signed)
Okay for home health.

## 2017-12-22 NOTE — Telephone Encounter (Signed)
Copied from [place] [id number]. Topic: General - Other >> Dec 22, 2017  8:26 AM Valla Leaver wrote: Reason for CRM: April, Rn with [facility] calling to notify the patient will have a missed visit for skilled nursing. Unable to contact patient last night and this morning. Cb#[phone]

## 2017-12-26 DIAGNOSIS — G35 Multiple sclerosis: Secondary | ICD-10-CM | POA: Diagnosis not present

## 2017-12-26 DIAGNOSIS — I739 Peripheral vascular disease, unspecified: Secondary | ICD-10-CM | POA: Diagnosis not present

## 2017-12-26 DIAGNOSIS — L03116 Cellulitis of left lower limb: Secondary | ICD-10-CM | POA: Diagnosis not present

## 2017-12-26 DIAGNOSIS — N183 Chronic kidney disease, stage 3 (moderate): Secondary | ICD-10-CM | POA: Diagnosis not present

## 2017-12-26 DIAGNOSIS — N319 Neuromuscular dysfunction of bladder, unspecified: Secondary | ICD-10-CM | POA: Diagnosis not present

## 2017-12-26 DIAGNOSIS — Z466 Encounter for fitting and adjustment of urinary device: Secondary | ICD-10-CM | POA: Diagnosis not present

## 2017-12-26 DIAGNOSIS — I129 Hypertensive chronic kidney disease with stage 1 through stage 4 chronic kidney disease, or unspecified chronic kidney disease: Secondary | ICD-10-CM | POA: Diagnosis not present

## 2017-12-26 DIAGNOSIS — I872 Venous insufficiency (chronic) (peripheral): Secondary | ICD-10-CM | POA: Diagnosis not present

## 2017-12-26 DIAGNOSIS — J449 Chronic obstructive pulmonary disease, unspecified: Secondary | ICD-10-CM | POA: Diagnosis not present

## 2017-12-31 ENCOUNTER — Ambulatory Visit: Payer: Self-pay | Admitting: *Deleted

## 2017-12-31 DIAGNOSIS — N183 Chronic kidney disease, stage 3 (moderate): Secondary | ICD-10-CM | POA: Diagnosis not present

## 2017-12-31 DIAGNOSIS — T83511A Infection and inflammatory reaction due to indwelling urethral catheter, initial encounter: Secondary | ICD-10-CM | POA: Diagnosis not present

## 2017-12-31 DIAGNOSIS — R5383 Other fatigue: Secondary | ICD-10-CM | POA: Diagnosis not present

## 2017-12-31 DIAGNOSIS — I872 Venous insufficiency (chronic) (peripheral): Secondary | ICD-10-CM | POA: Diagnosis not present

## 2017-12-31 DIAGNOSIS — M79605 Pain in left leg: Secondary | ICD-10-CM | POA: Diagnosis not present

## 2017-12-31 DIAGNOSIS — N390 Urinary tract infection, site not specified: Secondary | ICD-10-CM | POA: Diagnosis not present

## 2017-12-31 DIAGNOSIS — I739 Peripheral vascular disease, unspecified: Secondary | ICD-10-CM | POA: Diagnosis not present

## 2017-12-31 DIAGNOSIS — G35 Multiple sclerosis: Secondary | ICD-10-CM | POA: Diagnosis not present

## 2017-12-31 DIAGNOSIS — M79661 Pain in right lower leg: Secondary | ICD-10-CM | POA: Diagnosis not present

## 2017-12-31 DIAGNOSIS — J449 Chronic obstructive pulmonary disease, unspecified: Secondary | ICD-10-CM | POA: Diagnosis not present

## 2017-12-31 DIAGNOSIS — I129 Hypertensive chronic kidney disease with stage 1 through stage 4 chronic kidney disease, or unspecified chronic kidney disease: Secondary | ICD-10-CM | POA: Diagnosis not present

## 2017-12-31 DIAGNOSIS — M79604 Pain in right leg: Secondary | ICD-10-CM | POA: Diagnosis not present

## 2017-12-31 DIAGNOSIS — L03115 Cellulitis of right lower limb: Secondary | ICD-10-CM | POA: Diagnosis not present

## 2017-12-31 DIAGNOSIS — Z466 Encounter for fitting and adjustment of urinary device: Secondary | ICD-10-CM | POA: Diagnosis not present

## 2017-12-31 DIAGNOSIS — I10 Essential (primary) hypertension: Secondary | ICD-10-CM | POA: Diagnosis not present

## 2017-12-31 DIAGNOSIS — L03116 Cellulitis of left lower limb: Secondary | ICD-10-CM | POA: Diagnosis not present

## 2017-12-31 DIAGNOSIS — N319 Neuromuscular dysfunction of bladder, unspecified: Secondary | ICD-10-CM | POA: Diagnosis not present

## 2017-12-31 DIAGNOSIS — L539 Erythematous condition, unspecified: Secondary | ICD-10-CM | POA: Diagnosis not present

## 2017-12-31 NOTE — Telephone Encounter (Signed)
Documentation reviewed. Agreeable with acute evaluation of condition.

## 2017-12-31 NOTE — Telephone Encounter (Signed)
Pt's care giver called with the patient having swelling in her ankles and feet, mostly the left one. But both are red and painful. No drainage noted but starting to get sores. Pt has hx of cellulitis. Legs are hot to touch.  Home health nurse is there and states pt's vital signs are 132/86 HR 93 O2 sat 98 and Temp is 97.6.  States pt is alert and oriented. Denies shortness of breath, chest pain or weakness.  Care giver states patient can get bad pretty quick. Pt had this same problem last week and was advised to go to an ED but refused.  Per protocol, pt going to ED at hospital in [place].  Care giver voiced understanding.  Reason for Disposition . [1] Red area or streak [2] large (> 2 in. or 5 cm)  Answer Assessment - Initial Assessment Questions 1. ONSET: "When did the swelling start?" (e.g., minutes, hours, days)     Last week 2. LOCATION: "What part of the leg is swollen?"  "Are both legs swollen or just one leg?"     Left leg  3. SEVERITY: "How bad is the swelling?" (e.g., localized; mild, moderate, severe)  - Localized - small area of swelling localized to one leg  - MILD pedal edema - swelling limited to foot and ankle, pitting edema < 1/4 inch (6 mm) deep, rest and elevation eliminate most or all swelling  - MODERATE edema - swelling of lower leg to knee, pitting edema > 1/4 inch (6 mm) deep, rest and elevation only partially reduce swelling  - SEVERE edema - swelling extends above knee, facial or hand swelling present      Foot and ankle 4. REDNESS: "Does the swelling look red or infected?"     Red with sores 5. PAIN: "Is the swelling painful to touch?" If so, ask: "How painful is it?"   (Scale 1-10; mild, moderate or severe)     Pain in leg #3 6. FEVER: "Do you have a fever?" If so, ask: "What is it, how was it measured, and when did it start?"      no 7. CAUSE: "What do you think is causing the leg swelling?"     no 8. MEDICAL HISTORY: "Do you have a history of heart  failure, kidney disease, liver failure, or cancer?"     Kidney failure  9. RECURRENT SYMPTOM: "Have you had leg swelling before?" If so, ask: "When was the last time?" "What happened that time?"     Yes last week 10. OTHER SYMPTOMS: "Do you have any other symptoms?" (e.g., chest pain, difficulty breathing)       no  Protocols used: LEG SWELLING AND EDEMA-A-AH

## 2018-01-01 DIAGNOSIS — I872 Venous insufficiency (chronic) (peripheral): Secondary | ICD-10-CM | POA: Diagnosis not present

## 2018-01-01 DIAGNOSIS — L03116 Cellulitis of left lower limb: Secondary | ICD-10-CM | POA: Diagnosis not present

## 2018-01-01 DIAGNOSIS — I129 Hypertensive chronic kidney disease with stage 1 through stage 4 chronic kidney disease, or unspecified chronic kidney disease: Secondary | ICD-10-CM | POA: Diagnosis not present

## 2018-01-01 DIAGNOSIS — I739 Peripheral vascular disease, unspecified: Secondary | ICD-10-CM | POA: Diagnosis not present

## 2018-01-01 DIAGNOSIS — N319 Neuromuscular dysfunction of bladder, unspecified: Secondary | ICD-10-CM | POA: Diagnosis not present

## 2018-01-01 DIAGNOSIS — G35 Multiple sclerosis: Secondary | ICD-10-CM | POA: Diagnosis not present

## 2018-01-01 DIAGNOSIS — J449 Chronic obstructive pulmonary disease, unspecified: Secondary | ICD-10-CM | POA: Diagnosis not present

## 2018-01-01 DIAGNOSIS — Z466 Encounter for fitting and adjustment of urinary device: Secondary | ICD-10-CM | POA: Diagnosis not present

## 2018-01-01 DIAGNOSIS — N183 Chronic kidney disease, stage 3 (moderate): Secondary | ICD-10-CM | POA: Diagnosis not present

## 2018-01-06 DIAGNOSIS — N183 Chronic kidney disease, stage 3 (moderate): Secondary | ICD-10-CM | POA: Diagnosis not present

## 2018-01-06 DIAGNOSIS — I739 Peripheral vascular disease, unspecified: Secondary | ICD-10-CM | POA: Diagnosis not present

## 2018-01-06 DIAGNOSIS — Z466 Encounter for fitting and adjustment of urinary device: Secondary | ICD-10-CM | POA: Diagnosis not present

## 2018-01-06 DIAGNOSIS — N319 Neuromuscular dysfunction of bladder, unspecified: Secondary | ICD-10-CM | POA: Diagnosis not present

## 2018-01-06 DIAGNOSIS — G35 Multiple sclerosis: Secondary | ICD-10-CM | POA: Diagnosis not present

## 2018-01-06 DIAGNOSIS — I872 Venous insufficiency (chronic) (peripheral): Secondary | ICD-10-CM | POA: Diagnosis not present

## 2018-01-06 DIAGNOSIS — I129 Hypertensive chronic kidney disease with stage 1 through stage 4 chronic kidney disease, or unspecified chronic kidney disease: Secondary | ICD-10-CM | POA: Diagnosis not present

## 2018-01-06 DIAGNOSIS — L03116 Cellulitis of left lower limb: Secondary | ICD-10-CM | POA: Diagnosis not present

## 2018-01-06 DIAGNOSIS — J449 Chronic obstructive pulmonary disease, unspecified: Secondary | ICD-10-CM | POA: Diagnosis not present

## 2018-01-13 DIAGNOSIS — N319 Neuromuscular dysfunction of bladder, unspecified: Secondary | ICD-10-CM | POA: Diagnosis not present

## 2018-01-13 DIAGNOSIS — Z466 Encounter for fitting and adjustment of urinary device: Secondary | ICD-10-CM | POA: Diagnosis not present

## 2018-01-13 DIAGNOSIS — I872 Venous insufficiency (chronic) (peripheral): Secondary | ICD-10-CM | POA: Diagnosis not present

## 2018-01-13 DIAGNOSIS — G35 Multiple sclerosis: Secondary | ICD-10-CM | POA: Diagnosis not present

## 2018-01-13 DIAGNOSIS — I739 Peripheral vascular disease, unspecified: Secondary | ICD-10-CM | POA: Diagnosis not present

## 2018-01-13 DIAGNOSIS — N183 Chronic kidney disease, stage 3 (moderate): Secondary | ICD-10-CM | POA: Diagnosis not present

## 2018-01-13 DIAGNOSIS — J449 Chronic obstructive pulmonary disease, unspecified: Secondary | ICD-10-CM | POA: Diagnosis not present

## 2018-01-13 DIAGNOSIS — I129 Hypertensive chronic kidney disease with stage 1 through stage 4 chronic kidney disease, or unspecified chronic kidney disease: Secondary | ICD-10-CM | POA: Diagnosis not present

## 2018-01-13 DIAGNOSIS — L03116 Cellulitis of left lower limb: Secondary | ICD-10-CM | POA: Diagnosis not present

## 2018-01-15 ENCOUNTER — Ambulatory Visit: Payer: 59 | Admitting: Family Medicine

## 2018-01-22 DIAGNOSIS — I129 Hypertensive chronic kidney disease with stage 1 through stage 4 chronic kidney disease, or unspecified chronic kidney disease: Secondary | ICD-10-CM | POA: Diagnosis not present

## 2018-01-22 DIAGNOSIS — N319 Neuromuscular dysfunction of bladder, unspecified: Secondary | ICD-10-CM | POA: Diagnosis not present

## 2018-01-22 DIAGNOSIS — G35 Multiple sclerosis: Secondary | ICD-10-CM | POA: Diagnosis not present

## 2018-01-22 DIAGNOSIS — N183 Chronic kidney disease, stage 3 (moderate): Secondary | ICD-10-CM | POA: Diagnosis not present

## 2018-01-22 DIAGNOSIS — Z466 Encounter for fitting and adjustment of urinary device: Secondary | ICD-10-CM | POA: Diagnosis not present

## 2018-01-22 DIAGNOSIS — I739 Peripheral vascular disease, unspecified: Secondary | ICD-10-CM | POA: Diagnosis not present

## 2018-01-22 DIAGNOSIS — J449 Chronic obstructive pulmonary disease, unspecified: Secondary | ICD-10-CM | POA: Diagnosis not present

## 2018-01-22 DIAGNOSIS — L03116 Cellulitis of left lower limb: Secondary | ICD-10-CM | POA: Diagnosis not present

## 2018-01-22 DIAGNOSIS — I872 Venous insufficiency (chronic) (peripheral): Secondary | ICD-10-CM | POA: Diagnosis not present

## 2018-01-31 DIAGNOSIS — N183 Chronic kidney disease, stage 3 (moderate): Secondary | ICD-10-CM | POA: Diagnosis not present

## 2018-01-31 DIAGNOSIS — I129 Hypertensive chronic kidney disease with stage 1 through stage 4 chronic kidney disease, or unspecified chronic kidney disease: Secondary | ICD-10-CM | POA: Diagnosis not present

## 2018-01-31 DIAGNOSIS — I739 Peripheral vascular disease, unspecified: Secondary | ICD-10-CM | POA: Diagnosis not present

## 2018-01-31 DIAGNOSIS — G35 Multiple sclerosis: Secondary | ICD-10-CM | POA: Diagnosis not present

## 2018-01-31 DIAGNOSIS — I872 Venous insufficiency (chronic) (peripheral): Secondary | ICD-10-CM | POA: Diagnosis not present

## 2018-01-31 DIAGNOSIS — N319 Neuromuscular dysfunction of bladder, unspecified: Secondary | ICD-10-CM | POA: Diagnosis not present

## 2018-01-31 DIAGNOSIS — J449 Chronic obstructive pulmonary disease, unspecified: Secondary | ICD-10-CM | POA: Diagnosis not present

## 2018-01-31 DIAGNOSIS — L03116 Cellulitis of left lower limb: Secondary | ICD-10-CM | POA: Diagnosis not present

## 2018-01-31 DIAGNOSIS — Z466 Encounter for fitting and adjustment of urinary device: Secondary | ICD-10-CM | POA: Diagnosis not present

## 2018-02-02 ENCOUNTER — Telehealth: Payer: Self-pay | Admitting: Family Medicine

## 2018-02-02 ENCOUNTER — Encounter: Payer: Self-pay | Admitting: Family Medicine

## 2018-02-02 ENCOUNTER — Ambulatory Visit (INDEPENDENT_AMBULATORY_CARE_PROVIDER_SITE_OTHER): Payer: Medicare HMO | Admitting: Family Medicine

## 2018-02-02 VITALS — BP 144/82 | HR 80 | Temp 98.0°F

## 2018-02-02 DIAGNOSIS — I10 Essential (primary) hypertension: Secondary | ICD-10-CM

## 2018-02-02 DIAGNOSIS — J441 Chronic obstructive pulmonary disease with (acute) exacerbation: Secondary | ICD-10-CM | POA: Diagnosis not present

## 2018-02-02 DIAGNOSIS — Z23 Encounter for immunization: Secondary | ICD-10-CM

## 2018-02-02 DIAGNOSIS — R0989 Other specified symptoms and signs involving the circulatory and respiratory systems: Secondary | ICD-10-CM

## 2018-02-02 DIAGNOSIS — I700 Atherosclerosis of aorta: Secondary | ICD-10-CM | POA: Diagnosis not present

## 2018-02-02 DIAGNOSIS — E786 Lipoprotein deficiency: Secondary | ICD-10-CM

## 2018-02-02 DIAGNOSIS — N319 Neuromuscular dysfunction of bladder, unspecified: Secondary | ICD-10-CM | POA: Diagnosis not present

## 2018-02-02 DIAGNOSIS — L98491 Non-pressure chronic ulcer of skin of other sites limited to breakdown of skin: Secondary | ICD-10-CM

## 2018-02-02 DIAGNOSIS — Z5181 Encounter for therapeutic drug level monitoring: Secondary | ICD-10-CM | POA: Diagnosis not present

## 2018-02-02 LAB — LIPID PANEL
Cholesterol: 155 mg/dL (ref <200)
HDL: 35 mg/dL — ABNORMAL LOW (ref >50)
LDL Cholesterol (Calc): 96 mg/dL (calc)
Non-HDL Cholesterol (Calc): 120 mg/dL (calc) (ref <130)
Total CHOL/HDL Ratio: 4.4 (calc) (ref <5.0)
Triglycerides: 147 mg/dL (ref <150)

## 2018-02-02 LAB — CBC WITH DIFFERENTIAL/PLATELET
Basophils Absolute: 62 cells/uL (ref 0–200)
Basophils Relative: 1.1 %
Eosinophils Absolute: 218 cells/uL (ref 15–500)
Eosinophils Relative: 3.9 %
HCT: 39.9 % (ref 35.0–45.0)
Hemoglobin: 13.3 g/dL (ref 11.7–15.5)
Lymphs Abs: 526 cells/uL — ABNORMAL LOW (ref 850–3900)
MCH: 28.2 pg (ref 27.0–33.0)
MCHC: 33.3 g/dL (ref 32.0–36.0)
MCV: 84.7 fL (ref 80.0–100.0)
MPV: 9.6 fL (ref 7.5–12.5)
Monocytes Relative: 9.3 %
Neutro Abs: 4273 cells/uL (ref 1500–7800)
Neutrophils Relative %: 76.3 %
Platelets: 254 10*3/uL (ref 140–400)
RBC: 4.71 10*6/uL (ref 3.80–5.10)
RDW: 14.3 % (ref 11.0–15.0)
Total Lymphocyte: 9.4 %
WBC mixed population: 521 cells/uL (ref 200–950)
WBC: 5.6 10*3/uL (ref 3.8–10.8)

## 2018-02-02 LAB — COMPLETE METABOLIC PANEL WITH GFR
AG Ratio: 1.5 (calc) (ref 1.0–2.5)
ALT: 19 U/L (ref 6–29)
AST: 18 U/L (ref 10–35)
Albumin: 4.4 g/dL (ref 3.6–5.1)
Alkaline phosphatase (APISO): 89 U/L (ref 33–130)
BUN/Creatinine Ratio: 15 (calc) (ref 6–22)
BUN: 17 mg/dL (ref 7–25)
CO2: 29 mmol/L (ref 20–32)
Calcium: 9.7 mg/dL (ref 8.6–10.4)
Chloride: 103 mmol/L (ref 98–110)
Creat: 1.14 mg/dL — ABNORMAL HIGH (ref 0.50–0.99)
GFR, Est African American: 58 mL/min/{1.73_m2} — ABNORMAL LOW (ref >=60)
GFR, Est Non African American: 50 mL/min/{1.73_m2} — ABNORMAL LOW (ref >=60)
Globulin: 2.9 g/dL (calc) (ref 1.9–3.7)
Glucose, Bld: 90 mg/dL (ref 65–99)
Potassium: 3.8 mmol/L (ref 3.5–5.3)
Sodium: 140 mmol/L (ref 135–146)
Total Bilirubin: 0.5 mg/dL (ref 0.2–1.2)
Total Protein: 7.3 g/dL (ref 6.1–8.1)

## 2018-02-02 MED ORDER — HYDRALAZINE HCL 50 MG PO TABS: 50.0000 mg | ORAL_TABLET | Freq: Three times a day (TID) | ORAL | 5 refills | Status: DC

## 2018-02-02 MED ORDER — FUROSEMIDE 20 MG PO TABS: 20.0000 mg | ORAL_TABLET | Freq: Every day | ORAL | 0 refills | Status: DC

## 2018-02-02 MED ORDER — POTASSIUM CHLORIDE ER 10 MEQ PO TBCR: 10.0000 meq | EXTENDED_RELEASE_TABLET | Freq: Every day | ORAL | 0 refills | Status: DC

## 2018-02-02 NOTE — Assessment & Plan Note (Signed)
Some crackles, no wheezes on lung exam; will get CXR

## 2018-02-02 NOTE — Progress Notes (Signed)
BP (!) 144/82   Pulse 80   Temp 98 F (36.7 C) (Oral)   SpO2 98%    Subjective:    Patient ID: [name], female    DOB: July 15, 1950, 67 y.o.   MRN: [id number]  HPI: [name] is a 67 y.o. female  Chief Complaint  Patient presents with  . Follow-up    friend feels like she looks to pale for her normal  . Cyst    on top of vagina, getting bigger  . Abrasion    multiple spots on leg top of head  . Wheezing    sob, friends states hears crackling in chest  . Medication Refill    unable to make appt for psych wants to see about getting clonzepam refilled    HPI Patient is here for f/u  She had so much crackling in her lungs today that scared her and family member She is short of breath; thinks she is getting enough oxygen; no cough; no fevers  Sores on the legs, right side lateral aspect; left side Unna boot  She has chronic recurrent depression and anxiety; she had an appointment with Dr. Kasandra Knudsen when it was 70 degrees and she couldn't go; getting ready to run out of the clonazepam; Dr. Kasandra Knudsen said no more until she is seen; hard for her to get out and about  She has puffiness over the suprapubic region; cancer doctor has seen it; hx of lymphoma, not sure what this is  She is indeed taking hydralazine; med list reviewed; HTN is chronic  Depression screen [facility] 2/9 02/02/2018 10/14/2017 08/07/2017 04/23/2017 03/03/2017  Decreased Interest 3 0 0 1 1  Down, Depressed, Hopeless 3 1 0 3 1  PHQ - 2 Score 6 1 0 4 2  Altered sleeping 1 3 - 1 0  Tired, decreased energy 3 3 - 3 2  Change in appetite 3 1 - 0 1  Feeling bad or failure about yourself  3 1 - 3 0  Trouble concentrating 0 0 - - 0  Moving slowly or fidgety/restless 0 0 - 2 0  Suicidal thoughts 0 0 - 0 0  PHQ-9 Score 16 9 - 13 5  Difficult doing work/chores Very difficult - - - Somewhat difficult  Some recent data might be hidden   Fall Risk  02/02/2018 10/14/2017 08/07/2017 04/23/2017 03/25/2017  Falls in the past year? Yes  Yes Yes No (No Data)  Comment - - - - Patient denies new falls today, since [facility] RN CM outreach last week where she reported a fall then  Number falls in past yr: 1 2 or more 2 or more - -  Injury with Fall? No No No - -  Risk Factor Category  - - High Fall Risk - -  Risk for fall due to : - - - - -  Follow up - - - - -    Relevant past medical, surgical, family and social history reviewed Past Medical History:  Diagnosis Date  . Abdominal aortic atherosclerosis ([place]) 11/11/2016  . ADHD   . Anxiety   . COPD (chronic obstructive pulmonary disease) ([place])   . Depression    major depressive  . Dyspnea    doe  . Edema    left leg  . Follicular lymphoma ([place])    B Cell  . Follicular lymphoma grade II ([place])   . Hypertension   . Hypotension    idiopathic  . Kyphoscoliosis and scoliosis  11/26/2011  . Morbid obesity ([place]) 01/05/2016  . Multiple sclerosis ([place])   . Multiple sclerosis ([place])    1980's  . Neuromuscular disorder ([place])   . Obstructive and reflux uropathy    foley  . Pain    atypical facial  . Peripheral vascular disease of lower extremity with ulceration ([place]) 11/08/2015  . Skin ulcer ([place]) 11/08/2015  . Weakness    generalized. has MS   Past Surgical History:  Procedure Laterality Date  . BACK SURGERY N/A 2002  . CYST EXCISION     lower back  . INGUINAL LYMPH NODE BIOPSY Left 07/04/2016   Procedure: INGUINAL LYMPH NODE BIOPSY;  Surgeon: Christene Lye, MD;  Location: [facility] ORS;  Service: General;  Laterality: Left;  . PORTACATH PLACEMENT N/A 07/22/2016   Procedure: INSERTION PORT-A-CATH;  Surgeon: Christene Lye, MD;  Location: [facility] ORS;  Service: General;  Laterality: N/A;  . TONSILLECTOMY AND ADENOIDECTOMY    . TUBAL LIGATION     Family History  Problem Relation Age of Onset  . COPD Mother   . Diabetes Mother   . Heart failure Mother   . Alcohol abuse Father   . Kidney disease Father   . Kidney failure Father   . Arthritis Sister   . CAD Maternal  Grandmother   . Stroke Maternal Grandfather   . Arthritis Sister   . Mental illness Sister   . Arthritis Brother    Social History   Tobacco Use  . Smoking status: Former Smoker    Packs/day: 1.00    Years: 20.00    Pack years: 20.00    Types: Cigarettes    Start date: 04/30/1995    Last attempt to quit: 02/03/2016    Years since quitting: 2.0  . Smokeless tobacco: Never Used  Substance Use Topics  . Alcohol use: No    Alcohol/week: 0.0 standard drinks  . Drug use: Yes    Types: Marijuana    Comment: smokes THC occasionally per pt      Office Visit from 02/02/2018 in [facility]  AUDIT-C Score  0      Interim medical history since last visit reviewed. Allergies and medications reviewed  Review of Systems Per HPI unless specifically indicated above     Objective:    BP (!) 144/82   Pulse 80   Temp 98 F (36.7 C) (Oral)   SpO2 98%   Wt Readings from Last 3 Encounters:  11/06/17 (!) 302 lb 6.4 oz (137.2 kg)  08/28/17 300 lb (136.1 kg)  07/24/17 (!) 307 lb (139.3 kg)    Physical Exam  Constitutional: She appears well-developed and well-nourished. No distress.  HENT:  Head: Normocephalic and atraumatic.  Eyes: EOM are normal. No scleral icterus.  Neck: No thyromegaly present.  Cardiovascular: Normal rate, regular rhythm and normal heart sounds.  No murmur heard. Pulmonary/Chest: Effort normal and breath sounds normal. No respiratory distress. She has no wheezes.  Abdominal: Soft. Bowel sounds are normal. She exhibits no distension.  Musculoskeletal: She exhibits no edema.  Neurological: She is alert.  Skin: Skin is warm and dry. Lesion (shallow ulceration on the lateral right leg where leg hits wheelchair) noted. She is not diaphoretic. No pallor.  Peau d'orange changes to the skin over the mons pubis, midline; no significant erythema to suggest celluitis; symmetric; no drainage or focal abscess  Psychiatric: She has a normal mood and affect.  Her behavior is normal. Judgment and thought content normal.  Assessment & Plan:   Problem List Items Addressed This Visit      Cardiovascular and Mediastinum   Hypertension goal BP (blood pressure) < 140/90    Increase hydralazine from BID to TID; limit salt intake      Relevant Medications   furosemide (LASIX) 20 MG tablet   hydrALAZINE (APRESOLINE) 50 MG tablet   Abdominal aortic atherosclerosis (HCC)    Check lipids      Relevant Medications   furosemide (LASIX) 20 MG tablet   hydrALAZINE (APRESOLINE) 50 MG tablet   Other Relevant Orders   Lipid panel (Completed)     Respiratory   COPD with bronchial hyperresponsiveness (HCC)    Some crackles, no wheezes on lung exam; will get CXR        Musculoskeletal and Integument   Skin ulcer ([place])    Keep close eye on sores on the right leg; avoid pressure against wheelchair, chairs at home        Other   Bladder neurogenesis   Medication monitoring encounter   Relevant Orders   CBC with Differential/Platelet (Completed)   COMPLETE METABOLIC PANEL WITH GFR (Completed)   Low HDL (under 40)    Check lipids       Other Visit Diagnoses    Chest crackles    -  Primary   Relevant Orders   DG Chest 2 View   Need for influenza vaccination       Relevant Orders   Flu vaccine HIGH DOSE PF (Fluzone High dose) (Completed)       Follow up plan: Return in about 2 weeks (around 02/16/2018) for follow-up visit with Dr. Sanda Klein.  An after-visit summary was printed and given to the patient at [facility].  Please see the patient instructions which may contain other information and recommendations beyond what is mentioned above in the assessment and plan.  Meds ordered this encounter  Medications  . furosemide (LASIX) 20 MG tablet    Sig: Take 1 tablet (20 mg total) by mouth daily. STOP HCTZ    Dispense:  30 tablet    Refill:  0    Use this Rx instead; we'll do this instead of the HCTZ  . hydrALAZINE (APRESOLINE) 50 MG  tablet    Sig: Take 1 tablet (50 mg total) by mouth 3 (three) times daily.    Dispense:  90 tablet    Refill:  5  . potassium chloride (KLOR-CON 10) 10 MEQ tablet    Sig: Take 1 tablet (10 mEq total) by mouth daily.    Dispense:  30 tablet    Refill:  0    Orders Placed This Encounter  Procedures  . DG Chest 2 View  . Flu vaccine HIGH DOSE PF (Fluzone High dose)  . CBC with Differential/Platelet  . COMPLETE METABOLIC PANEL WITH GFR  . Lipid panel

## 2018-02-02 NOTE — Assessment & Plan Note (Signed)
Keep close eye on sores on the right leg; avoid pressure against wheelchair, chairs at home

## 2018-02-02 NOTE — Assessment & Plan Note (Signed)
Check lipids 

## 2018-02-02 NOTE — Assessment & Plan Note (Signed)
Increase hydralazine from BID to TID; limit salt intake

## 2018-02-02 NOTE — Telephone Encounter (Signed)
Noted, thank you

## 2018-02-02 NOTE — Patient Instructions (Signed)
We'll get labs and a chest xray If you have not heard anything from my staff in a week about any orders/referrals/studies from today, please contact us here to follow-up [phone] Please let your urologist know about your urine If you get worse, call 911 or go to the ER Start the furosemide and potassium Increase the hydralazine from twice a day to three times a day

## 2018-02-02 NOTE — Telephone Encounter (Signed)
Copied from [place] [id number]. Topic: Quick Communication - See Telephone Encounter >> Feb 02, 2018  2:16 PM Bea Graff, NT wrote: CRM for notification. See Telephone encounter for: 02/02/18. Ander Gaster with [facility] states this pt had a missed visit last week due to pt nor husband would answer calls or call them back. CB#: [phone]

## 2018-02-04 ENCOUNTER — Other Ambulatory Visit: Payer: Self-pay | Admitting: Family Medicine

## 2018-02-04 DIAGNOSIS — I700 Atherosclerosis of aorta: Secondary | ICD-10-CM

## 2018-02-04 MED ORDER — ATORVASTATIN CALCIUM 10 MG PO TABS: 10.0000 mg | ORAL_TABLET | Freq: Every day | ORAL | 1 refills | Status: DC

## 2018-02-04 NOTE — Progress Notes (Signed)
Atorvastatin 10; recheck lipids in 6-8 weeks

## 2018-02-06 DIAGNOSIS — L03116 Cellulitis of left lower limb: Secondary | ICD-10-CM | POA: Diagnosis not present

## 2018-02-06 DIAGNOSIS — I872 Venous insufficiency (chronic) (peripheral): Secondary | ICD-10-CM | POA: Diagnosis not present

## 2018-02-06 DIAGNOSIS — I739 Peripheral vascular disease, unspecified: Secondary | ICD-10-CM | POA: Diagnosis not present

## 2018-02-06 DIAGNOSIS — N183 Chronic kidney disease, stage 3 (moderate): Secondary | ICD-10-CM | POA: Diagnosis not present

## 2018-02-06 DIAGNOSIS — J449 Chronic obstructive pulmonary disease, unspecified: Secondary | ICD-10-CM | POA: Diagnosis not present

## 2018-02-06 DIAGNOSIS — I129 Hypertensive chronic kidney disease with stage 1 through stage 4 chronic kidney disease, or unspecified chronic kidney disease: Secondary | ICD-10-CM | POA: Diagnosis not present

## 2018-02-06 DIAGNOSIS — G35 Multiple sclerosis: Secondary | ICD-10-CM | POA: Diagnosis not present

## 2018-02-06 DIAGNOSIS — N319 Neuromuscular dysfunction of bladder, unspecified: Secondary | ICD-10-CM | POA: Diagnosis not present

## 2018-02-06 DIAGNOSIS — Z466 Encounter for fitting and adjustment of urinary device: Secondary | ICD-10-CM | POA: Diagnosis not present

## 2018-02-17 ENCOUNTER — Ambulatory Visit: Payer: Medicare HMO | Admitting: Family Medicine

## 2018-02-17 DIAGNOSIS — I739 Peripheral vascular disease, unspecified: Secondary | ICD-10-CM | POA: Diagnosis not present

## 2018-02-17 DIAGNOSIS — I872 Venous insufficiency (chronic) (peripheral): Secondary | ICD-10-CM | POA: Diagnosis not present

## 2018-02-17 DIAGNOSIS — I129 Hypertensive chronic kidney disease with stage 1 through stage 4 chronic kidney disease, or unspecified chronic kidney disease: Secondary | ICD-10-CM | POA: Diagnosis not present

## 2018-02-17 DIAGNOSIS — G35 Multiple sclerosis: Secondary | ICD-10-CM | POA: Diagnosis not present

## 2018-02-17 DIAGNOSIS — Z466 Encounter for fitting and adjustment of urinary device: Secondary | ICD-10-CM | POA: Diagnosis not present

## 2018-02-17 DIAGNOSIS — J449 Chronic obstructive pulmonary disease, unspecified: Secondary | ICD-10-CM | POA: Diagnosis not present

## 2018-02-17 DIAGNOSIS — N319 Neuromuscular dysfunction of bladder, unspecified: Secondary | ICD-10-CM | POA: Diagnosis not present

## 2018-02-17 DIAGNOSIS — L03116 Cellulitis of left lower limb: Secondary | ICD-10-CM | POA: Diagnosis not present

## 2018-02-17 DIAGNOSIS — N183 Chronic kidney disease, stage 3 (moderate): Secondary | ICD-10-CM | POA: Diagnosis not present

## 2018-02-19 DIAGNOSIS — Z466 Encounter for fitting and adjustment of urinary device: Secondary | ICD-10-CM | POA: Diagnosis not present

## 2018-02-19 DIAGNOSIS — I739 Peripheral vascular disease, unspecified: Secondary | ICD-10-CM | POA: Diagnosis not present

## 2018-02-19 DIAGNOSIS — N183 Chronic kidney disease, stage 3 (moderate): Secondary | ICD-10-CM | POA: Diagnosis not present

## 2018-02-19 DIAGNOSIS — N319 Neuromuscular dysfunction of bladder, unspecified: Secondary | ICD-10-CM | POA: Diagnosis not present

## 2018-02-19 DIAGNOSIS — L03116 Cellulitis of left lower limb: Secondary | ICD-10-CM | POA: Diagnosis not present

## 2018-02-19 DIAGNOSIS — G35 Multiple sclerosis: Secondary | ICD-10-CM | POA: Diagnosis not present

## 2018-02-19 DIAGNOSIS — J449 Chronic obstructive pulmonary disease, unspecified: Secondary | ICD-10-CM | POA: Diagnosis not present

## 2018-02-19 DIAGNOSIS — I129 Hypertensive chronic kidney disease with stage 1 through stage 4 chronic kidney disease, or unspecified chronic kidney disease: Secondary | ICD-10-CM | POA: Diagnosis not present

## 2018-02-19 DIAGNOSIS — I872 Venous insufficiency (chronic) (peripheral): Secondary | ICD-10-CM | POA: Diagnosis not present

## 2018-02-24 DIAGNOSIS — J449 Chronic obstructive pulmonary disease, unspecified: Secondary | ICD-10-CM | POA: Diagnosis not present

## 2018-02-24 DIAGNOSIS — N319 Neuromuscular dysfunction of bladder, unspecified: Secondary | ICD-10-CM | POA: Diagnosis not present

## 2018-02-24 DIAGNOSIS — I872 Venous insufficiency (chronic) (peripheral): Secondary | ICD-10-CM | POA: Diagnosis not present

## 2018-02-24 DIAGNOSIS — L03116 Cellulitis of left lower limb: Secondary | ICD-10-CM | POA: Diagnosis not present

## 2018-02-24 DIAGNOSIS — I739 Peripheral vascular disease, unspecified: Secondary | ICD-10-CM | POA: Diagnosis not present

## 2018-02-24 DIAGNOSIS — Z466 Encounter for fitting and adjustment of urinary device: Secondary | ICD-10-CM | POA: Diagnosis not present

## 2018-02-24 DIAGNOSIS — G35 Multiple sclerosis: Secondary | ICD-10-CM | POA: Diagnosis not present

## 2018-02-24 DIAGNOSIS — N183 Chronic kidney disease, stage 3 (moderate): Secondary | ICD-10-CM | POA: Diagnosis not present

## 2018-02-24 DIAGNOSIS — I129 Hypertensive chronic kidney disease with stage 1 through stage 4 chronic kidney disease, or unspecified chronic kidney disease: Secondary | ICD-10-CM | POA: Diagnosis not present

## 2018-02-26 ENCOUNTER — Ambulatory Visit: Payer: Medicare HMO | Admitting: Family Medicine

## 2018-03-02 DIAGNOSIS — N183 Chronic kidney disease, stage 3 (moderate): Secondary | ICD-10-CM | POA: Diagnosis not present

## 2018-03-02 DIAGNOSIS — Z466 Encounter for fitting and adjustment of urinary device: Secondary | ICD-10-CM | POA: Diagnosis not present

## 2018-03-02 DIAGNOSIS — I739 Peripheral vascular disease, unspecified: Secondary | ICD-10-CM | POA: Diagnosis not present

## 2018-03-02 DIAGNOSIS — J449 Chronic obstructive pulmonary disease, unspecified: Secondary | ICD-10-CM | POA: Diagnosis not present

## 2018-03-02 DIAGNOSIS — N319 Neuromuscular dysfunction of bladder, unspecified: Secondary | ICD-10-CM | POA: Diagnosis not present

## 2018-03-02 DIAGNOSIS — G35 Multiple sclerosis: Secondary | ICD-10-CM | POA: Diagnosis not present

## 2018-03-02 DIAGNOSIS — L03116 Cellulitis of left lower limb: Secondary | ICD-10-CM | POA: Diagnosis not present

## 2018-03-02 DIAGNOSIS — I129 Hypertensive chronic kidney disease with stage 1 through stage 4 chronic kidney disease, or unspecified chronic kidney disease: Secondary | ICD-10-CM | POA: Diagnosis not present

## 2018-03-02 DIAGNOSIS — I872 Venous insufficiency (chronic) (peripheral): Secondary | ICD-10-CM | POA: Diagnosis not present

## 2018-03-03 ENCOUNTER — Telehealth: Payer: Self-pay | Admitting: Urology

## 2018-03-03 NOTE — Telephone Encounter (Signed)
Elena from [facility] called and LM on my VM that they needed an order for them to be able to change her foley from now on. Please call her @ [phone].   Sharyn Lull

## 2018-03-05 DIAGNOSIS — R4189 Other symptoms and signs involving cognitive functions and awareness: Secondary | ICD-10-CM | POA: Diagnosis not present

## 2018-03-05 DIAGNOSIS — G35 Multiple sclerosis: Secondary | ICD-10-CM | POA: Diagnosis not present

## 2018-03-05 DIAGNOSIS — G4733 Obstructive sleep apnea (adult) (pediatric): Secondary | ICD-10-CM | POA: Diagnosis not present

## 2018-03-05 DIAGNOSIS — Z6841 Body Mass Index (BMI) 40.0 and over, adult: Secondary | ICD-10-CM | POA: Diagnosis not present

## 2018-03-05 NOTE — Telephone Encounter (Signed)
This has been all taken care of.

## 2018-03-10 ENCOUNTER — Ambulatory Visit: Payer: Self-pay | Admitting: Family Medicine

## 2018-03-10 DIAGNOSIS — N183 Chronic kidney disease, stage 3 (moderate): Secondary | ICD-10-CM | POA: Diagnosis not present

## 2018-03-10 DIAGNOSIS — J449 Chronic obstructive pulmonary disease, unspecified: Secondary | ICD-10-CM | POA: Diagnosis not present

## 2018-03-10 DIAGNOSIS — I739 Peripheral vascular disease, unspecified: Secondary | ICD-10-CM | POA: Diagnosis not present

## 2018-03-10 DIAGNOSIS — N319 Neuromuscular dysfunction of bladder, unspecified: Secondary | ICD-10-CM | POA: Diagnosis not present

## 2018-03-10 DIAGNOSIS — L03116 Cellulitis of left lower limb: Secondary | ICD-10-CM | POA: Diagnosis not present

## 2018-03-10 DIAGNOSIS — I872 Venous insufficiency (chronic) (peripheral): Secondary | ICD-10-CM | POA: Diagnosis not present

## 2018-03-10 DIAGNOSIS — G35 Multiple sclerosis: Secondary | ICD-10-CM | POA: Diagnosis not present

## 2018-03-10 DIAGNOSIS — I129 Hypertensive chronic kidney disease with stage 1 through stage 4 chronic kidney disease, or unspecified chronic kidney disease: Secondary | ICD-10-CM | POA: Diagnosis not present

## 2018-03-10 DIAGNOSIS — Z466 Encounter for fitting and adjustment of urinary device: Secondary | ICD-10-CM | POA: Diagnosis not present

## 2018-03-20 DIAGNOSIS — I872 Venous insufficiency (chronic) (peripheral): Secondary | ICD-10-CM | POA: Diagnosis not present

## 2018-03-20 DIAGNOSIS — N319 Neuromuscular dysfunction of bladder, unspecified: Secondary | ICD-10-CM | POA: Diagnosis not present

## 2018-03-20 DIAGNOSIS — I739 Peripheral vascular disease, unspecified: Secondary | ICD-10-CM | POA: Diagnosis not present

## 2018-03-20 DIAGNOSIS — I129 Hypertensive chronic kidney disease with stage 1 through stage 4 chronic kidney disease, or unspecified chronic kidney disease: Secondary | ICD-10-CM | POA: Diagnosis not present

## 2018-03-20 DIAGNOSIS — G35 Multiple sclerosis: Secondary | ICD-10-CM | POA: Diagnosis not present

## 2018-03-20 DIAGNOSIS — N183 Chronic kidney disease, stage 3 (moderate): Secondary | ICD-10-CM | POA: Diagnosis not present

## 2018-03-20 DIAGNOSIS — J449 Chronic obstructive pulmonary disease, unspecified: Secondary | ICD-10-CM | POA: Diagnosis not present

## 2018-03-20 DIAGNOSIS — Z466 Encounter for fitting and adjustment of urinary device: Secondary | ICD-10-CM | POA: Diagnosis not present

## 2018-03-20 DIAGNOSIS — L03116 Cellulitis of left lower limb: Secondary | ICD-10-CM | POA: Diagnosis not present

## 2018-03-24 DIAGNOSIS — N319 Neuromuscular dysfunction of bladder, unspecified: Secondary | ICD-10-CM | POA: Diagnosis not present

## 2018-03-24 DIAGNOSIS — I872 Venous insufficiency (chronic) (peripheral): Secondary | ICD-10-CM | POA: Diagnosis not present

## 2018-03-24 DIAGNOSIS — I129 Hypertensive chronic kidney disease with stage 1 through stage 4 chronic kidney disease, or unspecified chronic kidney disease: Secondary | ICD-10-CM | POA: Diagnosis not present

## 2018-03-24 DIAGNOSIS — I739 Peripheral vascular disease, unspecified: Secondary | ICD-10-CM | POA: Diagnosis not present

## 2018-03-24 DIAGNOSIS — J449 Chronic obstructive pulmonary disease, unspecified: Secondary | ICD-10-CM | POA: Diagnosis not present

## 2018-03-24 DIAGNOSIS — N183 Chronic kidney disease, stage 3 (moderate): Secondary | ICD-10-CM | POA: Diagnosis not present

## 2018-03-24 DIAGNOSIS — L03116 Cellulitis of left lower limb: Secondary | ICD-10-CM | POA: Diagnosis not present

## 2018-03-24 DIAGNOSIS — G35 Multiple sclerosis: Secondary | ICD-10-CM | POA: Diagnosis not present

## 2018-03-24 DIAGNOSIS — Z466 Encounter for fitting and adjustment of urinary device: Secondary | ICD-10-CM | POA: Diagnosis not present

## 2018-04-03 ENCOUNTER — Ambulatory Visit: Payer: Self-pay | Admitting: *Deleted

## 2018-04-03 ENCOUNTER — Other Ambulatory Visit: Payer: Self-pay | Admitting: Family Medicine

## 2018-04-03 DIAGNOSIS — I872 Venous insufficiency (chronic) (peripheral): Secondary | ICD-10-CM | POA: Diagnosis not present

## 2018-04-03 DIAGNOSIS — J449 Chronic obstructive pulmonary disease, unspecified: Secondary | ICD-10-CM | POA: Diagnosis not present

## 2018-04-03 DIAGNOSIS — N183 Chronic kidney disease, stage 3 (moderate): Secondary | ICD-10-CM | POA: Diagnosis not present

## 2018-04-03 DIAGNOSIS — I739 Peripheral vascular disease, unspecified: Secondary | ICD-10-CM | POA: Diagnosis not present

## 2018-04-03 DIAGNOSIS — L03116 Cellulitis of left lower limb: Secondary | ICD-10-CM | POA: Diagnosis not present

## 2018-04-03 DIAGNOSIS — Z466 Encounter for fitting and adjustment of urinary device: Secondary | ICD-10-CM | POA: Diagnosis not present

## 2018-04-03 DIAGNOSIS — G35 Multiple sclerosis: Secondary | ICD-10-CM | POA: Diagnosis not present

## 2018-04-03 DIAGNOSIS — I129 Hypertensive chronic kidney disease with stage 1 through stage 4 chronic kidney disease, or unspecified chronic kidney disease: Secondary | ICD-10-CM | POA: Diagnosis not present

## 2018-04-03 DIAGNOSIS — N319 Neuromuscular dysfunction of bladder, unspecified: Secondary | ICD-10-CM | POA: Diagnosis not present

## 2018-04-03 MED ORDER — CLINDAMYCIN HCL 300 MG PO CAPS: 300.0000 mg | ORAL_CAPSULE | Freq: Three times a day (TID) | ORAL | 0 refills | Status: DC

## 2018-04-03 NOTE — Telephone Encounter (Signed)
Pt.notified

## 2018-04-03 NOTE — Telephone Encounter (Signed)
'  [name]' LPN with [facility] calling to report change in pt's condition.  Pt present during call. Pt was seen in ED 12/31/17 'Cellulitis' right leg. Pt has Unna Boot left leg, HHN has been reapplying weekly. LPN states she last assessed left leg Friday 03/27/18. Reports increased swelling, redness, warmth; "Very taunt." Swelling from foot to knee, pitting at foot, ankle. States "Red streaks." Reports right leg with mild swelling, "Not as bad as left leg."  Pt reports "A little"   SOB with exertion, "But worse than before." States both legs "A little painful." LPN states VSS, pt afebrile. Directed pt to ED, refuses disposition. LPN questioning if something can be called in for pt.Gwendel Hanson ED disposition, need to be evaluated in ED. Asking for PCP advise.  Assured I would alert Dr. Sanda Klein.  [place] called practice, spoke to [facility]. Clinical staff not available presently. Informed pt and nurse triage note  routed to practice who would inform Dr. Sanda Klein.  Please advise:  [phone] Home   [phone] Mobile.  Reason for Disposition . [1] Red area or streak AND [2] fever    H/O cellulitis; worsening redness, swelling, warmth.  Answer Assessment - Initial Assessment Questions 1. ONSET: "When did the swelling start?" (e.g., minutes, hours, days)     Worsening since yesterday per pt.  HHN last assessed 1 week ago; increased redness, swelling, warmth. 2. LOCATION: "What part of the leg is swollen?"  "Are both legs swollen or just one leg?"     Both legs, Left > right;  from foot to knee.  3. SEVERITY: "How bad is the swelling?" (e.g., localized; mild, moderate, severe)  - Localized - small area of swelling localized to one leg  - MILD pedal edema - swelling limited to foot and ankle, pitting edema < 1/4 inch (6 mm) deep, rest and elevation eliminate most or all swelling  - MODERATE edema - swelling of lower leg to knee, pitting edema > 1/4 inch (6 mm) deep, rest and elevation only partially reduce  swelling  - SEVERE edema - swelling extends above knee, facial or hand swelling present      Moderate with redness, warmth, "Red streaks." 4. REDNESS: "Does the swelling look red or infected?"     yes 5. PAIN: "Is the swelling painful to touch?" If so, ask: "How painful is it?"   (Scale 1-10; mild, moderate or severe)     "A little" 6. FEVER: "Do you have a fever?" If so, ask: "What is it, how was it measured, and when did it start?"     No  VVS per HHN 7. CAUSE: "What do you think is causing the leg swelling?"     Cellulitis Seen in ED 8. MEDICAL HISTORY: "Do you have a history of heart failure, kidney disease, liver failure, or cancer?"     *No Answer* 9. RECURRENT SYMPTOM: "Have you had leg swelling before?" If so, ask: "When was the last time?" "What happened that time?"      10. OTHER SYMPTOMS: "Do you have any other symptoms?" (e.g., chest pain, difficulty breathing)       SOB with exertion, mild but worse than has been  Protocols used: LEG SWELLING AND EDEMA-A-AH

## 2018-04-03 NOTE — Telephone Encounter (Signed)
If she absolutely refuses ER evaluation, start antibiotics I just sent in, go to ER over weekend if worse

## 2018-04-03 NOTE — Progress Notes (Signed)
New Rx for clindamycin sent to pharmacy

## 2018-04-10 DIAGNOSIS — I129 Hypertensive chronic kidney disease with stage 1 through stage 4 chronic kidney disease, or unspecified chronic kidney disease: Secondary | ICD-10-CM | POA: Diagnosis not present

## 2018-04-10 DIAGNOSIS — G35 Multiple sclerosis: Secondary | ICD-10-CM | POA: Diagnosis not present

## 2018-04-10 DIAGNOSIS — N319 Neuromuscular dysfunction of bladder, unspecified: Secondary | ICD-10-CM | POA: Diagnosis not present

## 2018-04-10 DIAGNOSIS — J449 Chronic obstructive pulmonary disease, unspecified: Secondary | ICD-10-CM | POA: Diagnosis not present

## 2018-04-10 DIAGNOSIS — Z466 Encounter for fitting and adjustment of urinary device: Secondary | ICD-10-CM | POA: Diagnosis not present

## 2018-04-10 DIAGNOSIS — I872 Venous insufficiency (chronic) (peripheral): Secondary | ICD-10-CM | POA: Diagnosis not present

## 2018-04-10 DIAGNOSIS — L03116 Cellulitis of left lower limb: Secondary | ICD-10-CM | POA: Diagnosis not present

## 2018-04-10 DIAGNOSIS — I739 Peripheral vascular disease, unspecified: Secondary | ICD-10-CM | POA: Diagnosis not present

## 2018-04-10 DIAGNOSIS — N183 Chronic kidney disease, stage 3 (moderate): Secondary | ICD-10-CM | POA: Diagnosis not present

## 2018-04-14 ENCOUNTER — Telehealth: Payer: Self-pay | Admitting: Family Medicine

## 2018-04-14 DIAGNOSIS — I872 Venous insufficiency (chronic) (peripheral): Secondary | ICD-10-CM | POA: Diagnosis not present

## 2018-04-14 DIAGNOSIS — N183 Chronic kidney disease, stage 3 (moderate): Secondary | ICD-10-CM | POA: Diagnosis not present

## 2018-04-14 DIAGNOSIS — G35 Multiple sclerosis: Secondary | ICD-10-CM | POA: Diagnosis not present

## 2018-04-14 DIAGNOSIS — Z466 Encounter for fitting and adjustment of urinary device: Secondary | ICD-10-CM | POA: Diagnosis not present

## 2018-04-14 DIAGNOSIS — I739 Peripheral vascular disease, unspecified: Secondary | ICD-10-CM | POA: Diagnosis not present

## 2018-04-14 DIAGNOSIS — I129 Hypertensive chronic kidney disease with stage 1 through stage 4 chronic kidney disease, or unspecified chronic kidney disease: Secondary | ICD-10-CM | POA: Diagnosis not present

## 2018-04-14 DIAGNOSIS — N319 Neuromuscular dysfunction of bladder, unspecified: Secondary | ICD-10-CM | POA: Diagnosis not present

## 2018-04-14 DIAGNOSIS — L03116 Cellulitis of left lower limb: Secondary | ICD-10-CM | POA: Diagnosis not present

## 2018-04-14 DIAGNOSIS — J449 Chronic obstructive pulmonary disease, unspecified: Secondary | ICD-10-CM | POA: Diagnosis not present

## 2018-04-14 NOTE — Telephone Encounter (Signed)
Copied from [place] [id number]. Topic: Quick Communication - Home Health Verbal Orders >> Apr 14, 2018  4:08 PM Carolyn Stare wrote: Caller/Agency    Alina with [facility]   Callback Number   [phone]   Requesting   verbal orders to recertify her to maintain her cather and unna boots    Frequency   9 weeks

## 2018-04-15 ENCOUNTER — Telehealth: Payer: Self-pay | Admitting: Urology

## 2018-04-15 NOTE — Telephone Encounter (Signed)
Verbal orders given for boots.

## 2018-04-15 NOTE — Telephone Encounter (Signed)
Catheter orders need to go through urologist Okay to continue Unna boots

## 2018-04-15 NOTE — Telephone Encounter (Signed)
Alina from [facility] calling for Verbal orders to maintain services for foley cath, please call Alina at [phone].

## 2018-04-15 NOTE — Telephone Encounter (Signed)
Verbal order given to Alina with [facility] to maintain foley cath services.

## 2018-04-17 ENCOUNTER — Telehealth: Payer: Self-pay

## 2018-04-17 ENCOUNTER — Telehealth: Payer: Self-pay | Admitting: Family Medicine

## 2018-04-17 DIAGNOSIS — M79671 Pain in right foot: Secondary | ICD-10-CM

## 2018-04-17 DIAGNOSIS — M79672 Pain in left foot: Secondary | ICD-10-CM

## 2018-04-17 NOTE — Telephone Encounter (Signed)
Pt.notified

## 2018-04-17 NOTE — Telephone Encounter (Signed)
Not on current med list.

## 2018-04-17 NOTE — Telephone Encounter (Signed)
That's fine; please enter

## 2018-04-17 NOTE — Telephone Encounter (Signed)
Copied from [place] [id number]. Topic: Quick Communication - Rx Refill/Question >> Apr 17, 2018  2:58 PM Hewitt Shorts wrote: Medication: breo inhaler   Has the patient contacted their pharmacy? No -pt has not had it filled in a while (Agent: If no, request that the patient contact the pharmacy for the refill.) (Agent: If yes, when and what did the pharmacy advise?)  Preferred Pharmacy (with phone number or street name): [facility] [phone] Agent: Please be advised that RX refills may take up to 3 business days. We ask that you follow-up with your pharmacy.

## 2018-04-17 NOTE — Telephone Encounter (Signed)
Her BREO was stopped in December of 2018 It's clearly marked in the discharge summary to stop taking that medicine at discharge Has she been taking it, because I don't see that anyone has been prescribing it Fill history from pharmacy if needed for other inhalers, because we're not prescribing any

## 2018-04-17 NOTE — Telephone Encounter (Signed)
Copied from [place] [phone]. Topic: Referral - Request for Referral >> Apr 17, 2018  2:55 PM Hewitt Shorts wrote: Has patient seen PCP for this complaint? yes *If NO, is insurance requiring patient see PCP for this issue before PCP can refer them? Referral for which specialty: podiatry Preferred provider/office: Reason for referral: feet pain

## 2018-04-24 DIAGNOSIS — I872 Venous insufficiency (chronic) (peripheral): Secondary | ICD-10-CM | POA: Diagnosis not present

## 2018-04-24 DIAGNOSIS — G35 Multiple sclerosis: Secondary | ICD-10-CM | POA: Diagnosis not present

## 2018-04-24 DIAGNOSIS — J449 Chronic obstructive pulmonary disease, unspecified: Secondary | ICD-10-CM | POA: Diagnosis not present

## 2018-04-24 DIAGNOSIS — I129 Hypertensive chronic kidney disease with stage 1 through stage 4 chronic kidney disease, or unspecified chronic kidney disease: Secondary | ICD-10-CM | POA: Diagnosis not present

## 2018-04-24 DIAGNOSIS — Z466 Encounter for fitting and adjustment of urinary device: Secondary | ICD-10-CM | POA: Diagnosis not present

## 2018-04-24 DIAGNOSIS — I739 Peripheral vascular disease, unspecified: Secondary | ICD-10-CM | POA: Diagnosis not present

## 2018-04-24 DIAGNOSIS — N183 Chronic kidney disease, stage 3 (moderate): Secondary | ICD-10-CM | POA: Diagnosis not present

## 2018-04-24 DIAGNOSIS — N312 Flaccid neuropathic bladder, not elsewhere classified: Secondary | ICD-10-CM | POA: Diagnosis not present

## 2018-04-24 DIAGNOSIS — L03116 Cellulitis of left lower limb: Secondary | ICD-10-CM | POA: Diagnosis not present

## 2018-04-28 DIAGNOSIS — G35 Multiple sclerosis: Secondary | ICD-10-CM | POA: Diagnosis not present

## 2018-04-28 DIAGNOSIS — N312 Flaccid neuropathic bladder, not elsewhere classified: Secondary | ICD-10-CM | POA: Diagnosis not present

## 2018-04-28 DIAGNOSIS — I872 Venous insufficiency (chronic) (peripheral): Secondary | ICD-10-CM | POA: Diagnosis not present

## 2018-04-28 DIAGNOSIS — I739 Peripheral vascular disease, unspecified: Secondary | ICD-10-CM | POA: Diagnosis not present

## 2018-04-28 DIAGNOSIS — N183 Chronic kidney disease, stage 3 (moderate): Secondary | ICD-10-CM | POA: Diagnosis not present

## 2018-04-28 DIAGNOSIS — L03116 Cellulitis of left lower limb: Secondary | ICD-10-CM | POA: Diagnosis not present

## 2018-04-28 DIAGNOSIS — Z466 Encounter for fitting and adjustment of urinary device: Secondary | ICD-10-CM | POA: Diagnosis not present

## 2018-04-28 DIAGNOSIS — J449 Chronic obstructive pulmonary disease, unspecified: Secondary | ICD-10-CM | POA: Diagnosis not present

## 2018-04-28 DIAGNOSIS — I129 Hypertensive chronic kidney disease with stage 1 through stage 4 chronic kidney disease, or unspecified chronic kidney disease: Secondary | ICD-10-CM | POA: Diagnosis not present

## 2018-04-30 ENCOUNTER — Ambulatory Visit: Payer: Self-pay | Admitting: Podiatry

## 2018-05-01 ENCOUNTER — Ambulatory Visit: Payer: Medicare Other

## 2018-05-01 ENCOUNTER — Inpatient Hospital Stay: Payer: Medicare Other | Attending: Oncology

## 2018-05-01 DIAGNOSIS — G35 Multiple sclerosis: Secondary | ICD-10-CM | POA: Insufficient documentation

## 2018-05-01 DIAGNOSIS — Z923 Personal history of irradiation: Secondary | ICD-10-CM | POA: Insufficient documentation

## 2018-05-01 DIAGNOSIS — Z8572 Personal history of non-Hodgkin lymphomas: Secondary | ICD-10-CM | POA: Insufficient documentation

## 2018-05-01 DIAGNOSIS — F419 Anxiety disorder, unspecified: Secondary | ICD-10-CM | POA: Insufficient documentation

## 2018-05-01 DIAGNOSIS — Z87891 Personal history of nicotine dependence: Secondary | ICD-10-CM | POA: Insufficient documentation

## 2018-05-01 DIAGNOSIS — Z8249 Family history of ischemic heart disease and other diseases of the circulatory system: Secondary | ICD-10-CM | POA: Insufficient documentation

## 2018-05-01 DIAGNOSIS — F329 Major depressive disorder, single episode, unspecified: Secondary | ICD-10-CM | POA: Insufficient documentation

## 2018-05-01 DIAGNOSIS — R400 Somnolence: Secondary | ICD-10-CM | POA: Insufficient documentation

## 2018-05-06 ENCOUNTER — Ambulatory Visit
Admission: RE | Admit: 2018-05-06 | Discharge: 2018-05-06 | Disposition: A | Discharge status: Home / Self Care | Payer: Medicare Other | Source: Ambulatory Visit | Attending: Oncology | Admitting: Oncology

## 2018-05-06 DIAGNOSIS — C8293 Follicular lymphoma, unspecified, intra-abdominal lymph nodes: Secondary | ICD-10-CM | POA: Diagnosis not present

## 2018-05-06 LAB — POCT I-STAT CREATININE: Creatinine, Ser: 1.4 mg/dL — ABNORMAL HIGH (ref 0.44–1.00)

## 2018-05-06 MED ORDER — IOPAMIDOL (ISOVUE-300) INJECTION 61%
85.0000 mL | Freq: Once | INTRAVENOUS | Status: AC | PRN
  Administered 2018-05-06: 85 mL via INTRAVENOUS

## 2018-05-07 ENCOUNTER — Telehealth: Payer: Self-pay | Admitting: Family Medicine

## 2018-05-07 NOTE — Telephone Encounter (Signed)
Verbal order given  

## 2018-05-07 NOTE — Telephone Encounter (Signed)
Copied from [place] [id number]. Topic: Quick Communication - Home Health Verbal Orders >> May 07, 2018 12:48 PM Berneta Levins wrote: Caller/AgencyTheodoro Kos from [facility] Number: [phone], OK to leave a message Requesting OT/PT/Skilled Nursing/Social Work: PT Frequency: 2x a week for three weeks

## 2018-05-07 NOTE — Telephone Encounter (Signed)
That's okay

## 2018-05-08 ENCOUNTER — Encounter: Payer: Self-pay | Admitting: Oncology

## 2018-05-08 ENCOUNTER — Inpatient Hospital Stay (HOSPITAL_BASED_OUTPATIENT_CLINIC_OR_DEPARTMENT_OTHER): Payer: Medicare Other | Admitting: Oncology

## 2018-05-08 ENCOUNTER — Inpatient Hospital Stay: Payer: Medicare Other

## 2018-05-08 VITALS — BP 124/82 | HR 78 | Temp 98.0°F | Resp 16

## 2018-05-08 DIAGNOSIS — Z87891 Personal history of nicotine dependence: Secondary | ICD-10-CM

## 2018-05-08 DIAGNOSIS — F329 Major depressive disorder, single episode, unspecified: Secondary | ICD-10-CM | POA: Diagnosis not present

## 2018-05-08 DIAGNOSIS — Z923 Personal history of irradiation: Secondary | ICD-10-CM | POA: Diagnosis not present

## 2018-05-08 DIAGNOSIS — G35 Multiple sclerosis: Secondary | ICD-10-CM

## 2018-05-08 DIAGNOSIS — F419 Anxiety disorder, unspecified: Secondary | ICD-10-CM

## 2018-05-08 DIAGNOSIS — R400 Somnolence: Secondary | ICD-10-CM

## 2018-05-08 DIAGNOSIS — C8293 Follicular lymphoma, unspecified, intra-abdominal lymph nodes: Secondary | ICD-10-CM

## 2018-05-08 DIAGNOSIS — Z8249 Family history of ischemic heart disease and other diseases of the circulatory system: Secondary | ICD-10-CM

## 2018-05-08 DIAGNOSIS — Z8572 Personal history of non-Hodgkin lymphomas: Secondary | ICD-10-CM | POA: Diagnosis not present

## 2018-05-08 DIAGNOSIS — Z08 Encounter for follow-up examination after completed treatment for malignant neoplasm: Secondary | ICD-10-CM

## 2018-05-08 LAB — COMPREHENSIVE METABOLIC PANEL
ALT: 19 U/L (ref 0–44)
AST: 20 U/L (ref 15–41)
Albumin: 3.9 g/dL (ref 3.5–5.0)
Alkaline Phosphatase: 83 U/L (ref 38–126)
Anion gap: 8 (ref 5–15)
BUN: 18 mg/dL (ref 8–23)
CO2: 30 mmol/L (ref 22–32)
Calcium: 9.1 mg/dL (ref 8.9–10.3)
Chloride: 104 mmol/L (ref 98–111)
Creatinine, Ser: 1.26 mg/dL — ABNORMAL HIGH (ref 0.44–1.00)
GFR calc Af Amer: 51 mL/min — ABNORMAL LOW (ref >60)
GFR calc non Af Amer: 44 mL/min — ABNORMAL LOW (ref >60)
Glucose, Bld: 126 mg/dL — ABNORMAL HIGH (ref 70–99)
Potassium: 3.8 mmol/L (ref 3.5–5.1)
Sodium: 142 mmol/L (ref 135–145)
Total Bilirubin: 0.6 mg/dL (ref 0.3–1.2)
Total Protein: 7.1 g/dL (ref 6.5–8.1)

## 2018-05-08 LAB — CBC WITH DIFFERENTIAL/PLATELET
Abs Immature Granulocytes: 0.03 10*3/uL (ref 0.00–0.07)
Basophils Absolute: 0.1 10*3/uL (ref 0.0–0.1)
Basophils Relative: 1 %
Eosinophils Absolute: 0.3 10*3/uL (ref 0.0–0.5)
Eosinophils Relative: 5 %
HCT: 38.5 % (ref 36.0–46.0)
Hemoglobin: 12.1 g/dL (ref 12.0–15.0)
Immature Granulocytes: 1 %
Lymphocytes Relative: 7 %
Lymphs Abs: 0.4 10*3/uL — ABNORMAL LOW (ref 0.7–4.0)
MCH: 28.7 pg (ref 26.0–34.0)
MCHC: 31.4 g/dL (ref 30.0–36.0)
MCV: 91.2 fL (ref 80.0–100.0)
Monocytes Absolute: 0.4 10*3/uL (ref 0.1–1.0)
Monocytes Relative: 8 %
Neutro Abs: 4 10*3/uL (ref 1.7–7.7)
Neutrophils Relative %: 78 %
Platelets: 224 10*3/uL (ref 150–400)
RBC: 4.22 MIL/uL (ref 3.87–5.11)
RDW: 14.4 % (ref 11.5–15.5)
WBC: 5.1 10*3/uL (ref 4.0–10.5)
nRBC: 0 % (ref 0.0–0.2)

## 2018-05-08 LAB — LACTATE DEHYDROGENASE: LDH: 145 U/L (ref 98–192)

## 2018-05-08 NOTE — Progress Notes (Signed)
F/U Lymphoma. Denies night sweats, no fevers. No swollen lymph nodes. Bil unnaboot leg wraps performed by Home Health. Foley catheter intact. Low energy. Appetite is good.

## 2018-05-10 NOTE — Progress Notes (Signed)
Hematology/Oncology Consult note [facility]  [phone] [phone]  Patient Care Team: Kirk Ruths, MD as PCP - General (Internal Medicine) Sanda Klein, Satira Anis, MD as Attending Physician (Family Medicine) Christene Lye, MD (General Surgery) Clent Jacks, RN as Registered Nurse Abbie Sons, MD (Urology)   Name of the patient: [name]  [id number]  Jan 13, 1951   Date of visit: 05/10/18  Diagnosis- Stage II follicular lymphoma s/p 4 doses of rituxan. Currently under observation  Chief complaint/ Reason for visit- routine f/u of follicular lymphoma  Heme/Onc history: 1. patient is a 68 year old female who has had ongoing problems with pain in her left groin as well as his left leg swelling.   2. She had a Doppler of her left lower extremity on 02/28/2016 which showed: 1. Mild to moderate edema within the left calf. No evidence for acute DVT from left groin to popliteal fossa. Slightly limited evaluation of calf veins due to edema. 2. Left inguinal enlarged lymph nodes up to 6.2 cm, findings could be secondary to reactive node, infection, inflammation, or lymphoproliferative disease.  3. She had a repeat Doppler on 05/24/2016 which showed: IMPRESSION: 1. No evidence of lower extremity deep vein thrombosis, left. 2. Left inguinal lymphadenopathy. Differential diagnosis: Reactive adenopathy, lymphoma, metastatic disease.  4. CT abdomen on 06/14/2016 showed:  IMPRESSION: Lymphadenopathy within the left inguinal region and the left iliac chain. This is highly suspicious for underlying neoplasm. Biopsy is recommended for further evaluation.  5. Patient was seen by Dr. Jamal Collin from surgery and underwent excisional biopsy of the leftinguinal lymph node under ultrasound on 07/04/2016. Final pathology showed DIAGNOSIS:  A-B. LYMPH NODE, LEFT INGUINAL; EXCISION:  - FOLLICULAR LYMPHOMA, GRADE 1-2,  PREDOMINANTLY FOLLICULAR.   6. Patient has multiple sclerosis and has h/o depression and anxiety. She was given clonopin this morning and currently patient is drowsy but able to be aroused. She drifts off to sleep in between conversation. She is here with her friend who helps her out. Patient lives with her husband at baseline and uses a walker to ambulate. Reports pain and discomfort in her left groin. Denies fevers, chills, night sweats or weight loss  7. PET/CT scan from 07/16/16 showed: IMPRESSION: 1. Pathologic and hypermetabolic lower retroperitoneal, pelvic, and inguinal adenopathy compatible with Deauville 5 malignancy. 2. Mildly prominent right lower paratracheal lymph node, Deauville 4. 3. Accentuated activity in the right lateral pterygoid muscle without CT correlate, probably incidental, merits attention on any follow up. 4. Other imaging findings of potential clinical significance: Airway thickening is present, suggesting bronchitis or reactive airways disease. Trace left pleural effusion. Coronary and aortoiliac atherosclerosis. Prominent stool throughout the colon favors constipation. Late phase healing fractures of left lower anterior ribs.  8. Bone marrow biopsy from 07/18/2016 did not show any evidence of lymphoma. HIV and hepatitis B and hepatitis C testing was negative  9. Patient received 5 doses of rituxan between April-July 2018. Also received RT to her inguinal region  Interval history-patient continues to manage with the help of a walker at home.  She does have chronic bilateral lower extremity lymphedema and chronic wounds which is being managed by the wound care clinic.  She also has a chronic Foley catheter.  She denies any recent hospitalizations.  Reports chronic fatigue.  Appetite is fair and her weight has remained stable  ECOG PS- 3 Pain scale- 0   Review of systems- Review of Systems  Constitutional: Positive for malaise/fatigue. Negative for  chills,  fever and weight loss.  HENT: Negative for congestion, ear discharge and nosebleeds.   Eyes: Negative for blurred vision.  Respiratory: Negative for cough, hemoptysis, sputum production, shortness of breath and wheezing.   Cardiovascular: Negative for chest pain, palpitations, orthopnea and claudication.  Gastrointestinal: Negative for abdominal pain, blood in stool, constipation, diarrhea, heartburn, melena, nausea and vomiting.  Genitourinary: Negative for dysuria, flank pain, frequency, hematuria and urgency.  Musculoskeletal: Negative for back pain, joint pain and myalgias.       B/l leg wounds  Skin: Negative for rash.  Neurological: Negative for dizziness, tingling, focal weakness, seizures, weakness and headaches.  Endo/Heme/Allergies: Does not bruise/bleed easily.  Psychiatric/Behavioral: Negative for depression and suicidal ideas. The patient does not have insomnia.       No Known Allergies   Past Medical History:  Diagnosis Date  . Abdominal aortic atherosclerosis ([place]) 11/11/2016  . ADHD   . Anxiety   . COPD (chronic obstructive pulmonary disease) ([place])   . Depression    major depressive  . Dyspnea    doe  . Edema    left leg  . Follicular lymphoma ([place])    B Cell  . Follicular lymphoma grade II ([place])   . Hypertension   . Hypotension    idiopathic  . Kyphoscoliosis and scoliosis 11/26/2011  . Morbid obesity ([place]) 01/05/2016  . Multiple sclerosis ([place])   . Multiple sclerosis ([place])    1980's  . Neuromuscular disorder ([place])   . Obstructive and reflux uropathy    foley  . Pain    atypical facial  . Peripheral vascular disease of lower extremity with ulceration ([place]) 11/08/2015  . Skin ulcer ([place]) 11/08/2015  . Weakness    generalized. has MS     Past Surgical History:  Procedure Laterality Date  . BACK SURGERY N/A 2002  . CYST EXCISION     lower back  . INGUINAL LYMPH NODE BIOPSY Left 07/04/2016   Procedure: INGUINAL LYMPH NODE BIOPSY;  Surgeon: Christene Lye, MD;  Location: [facility] ORS;  Service: General;  Laterality: Left;  . PORTACATH PLACEMENT N/A 07/22/2016   Procedure: INSERTION PORT-A-CATH;  Surgeon: Christene Lye, MD;  Location: [facility] ORS;  Service: General;  Laterality: N/A;  . TONSILLECTOMY AND ADENOIDECTOMY    . TUBAL LIGATION      Social History   Socioeconomic History  . Marital status: Married    Spouse name: Not on file  . Number of children: 3  . Years of education: Not on file  . Highest education level: Not on file  Occupational History  . Occupation: disabled  Social Needs  . Financial resource strain: Not on file  . Food insecurity:    Worry: Not on file    Inability: Not on file  . Transportation needs:    Medical: Not on file    Non-medical: Not on file  Tobacco Use  . Smoking status: Former Smoker    Packs/day: 1.00    Years: 20.00    Pack years: 20.00    Types: Cigarettes    Start date: 04/30/1995    Last attempt to quit: 02/03/2016    Years since quitting: 2.2  . Smokeless tobacco: Never Used  Substance and Sexual Activity  . Alcohol use: No    Alcohol/week: 0.0 standard drinks  . Drug use: Yes    Types: Marijuana    Comment: smokes THC occasionally per pt   . Sexual activity: Not Currently  Birth control/protection: None  Lifestyle  . Physical activity:    Days per week: Not on file    Minutes per session: Not on file  . Stress: Not on file  Relationships  . Social connections:    Talks on phone: Not on file    Gets together: Not on file    Attends religious service: Not on file    Active member of club or organization: Not on file    Attends meetings of clubs or organizations: Not on file    Relationship status: Not on file  . Intimate partner violence:    Fear of current or ex partner: Not on file    Emotionally abused: Not on file    Physically abused: Not on file    Forced sexual activity: Not on file  Other Topics Concern  . Not on file  Social History Narrative    She is married.     Family History  Problem Relation Age of Onset  . COPD Mother   . Diabetes Mother   . Heart failure Mother   . Alcohol abuse Father   . Kidney disease Father   . Kidney failure Father   . Arthritis Sister   . CAD Maternal Grandmother   . Stroke Maternal Grandfather   . Arthritis Sister   . Mental illness Sister   . Arthritis Brother      Current Outpatient Medications:  .  atorvastatin (LIPITOR) 10 MG tablet, Take 1 tablet (10 mg total) by mouth at bedtime., Disp: 30 tablet, Rfl: 1 .  BIOTIN PO, Take by mouth daily. Pt taking 500 mcg daily, Disp: , Rfl:  .  buPROPion (WELLBUTRIN XL) 300 MG 24 hr tablet, Take 300 mg by mouth every morning. , Disp: , Rfl:  .  clonazePAM (KLONOPIN) 1 MG tablet, Take 1 tablet (1 mg total) by mouth at bedtime., Disp: 5 tablet, Rfl: 0 .  Diapers & Supplies (HUGGIES PULL-UPS) MISC, 1 each by Does not apply route 2 (two) times daily. Dx urinary incontinence, MS; LON 99 months, Disp: 60 each, Rfl: 11 .  DULoxetine (CYMBALTA) 60 MG capsule, Take 1 capsule (60 mg total) by mouth every morning., Disp: 30 capsule, Rfl: 3 .  furosemide (LASIX) 20 MG tablet, Take 1 tablet (20 mg total) by mouth daily. STOP HCTZ, Disp: 30 tablet, Rfl: 0 .  gabapentin (NEURONTIN) 600 MG tablet, Take 1 tablet (600 mg total) by mouth 3 (three) times daily., Disp: , Rfl:  .  hydrALAZINE (APRESOLINE) 50 MG tablet, Take 1 tablet (50 mg total) by mouth 3 (three) times daily., Disp: 90 tablet, Rfl: 5 .  interferon beta-1a (AVONEX) 30 MCG/0.5ML PSKT injection, Inject 30 mcg into the muscle every Monday., Disp: , Rfl:  .  lactobacillus acidophilus & bulgar (LACTINEX) chewable tablet, Chew 1 tablet by mouth 3 (three) times daily with meals., Disp: 15 tablet, Rfl: 0 .  methylphenidate (RITALIN) 10 MG tablet, Take 1 tablet (10 mg total) by mouth 2 (two) times daily., Disp: , Rfl:  .  Multiple Vitamin (MULTIVITAMIN WITH MINERALS) TABS tablet, Take 1 tablet by mouth every  evening. , Disp: , Rfl:  .  polyethylene glycol powder (GLYCOLAX/MIRALAX) powder, Take 17 g by mouth daily as needed for mild constipation. Mixed in water (Patient taking differently: Take 17 g by mouth daily as needed for mild constipation. For constipation), Disp: , Rfl:  .  potassium chloride (KLOR-CON 10) 10 MEQ tablet, Take 1 tablet (10 mEq total) by mouth  daily., Disp: 30 tablet, Rfl: 0 .  QUEtiapine Fumarate (SEROQUEL XR) 150 MG 24 hr tablet, Take 150 mg by mouth at bedtime. , Disp: , Rfl: 4 .  tiZANidine (ZANAFLEX) 4 MG tablet, Take 2 tablets by mouth 2 (two) times daily., Disp: , Rfl: 0 .  vitamin C (ASCORBIC ACID) 500 MG tablet, Take 500 mg by mouth every evening. , Disp: , Rfl:  .  mirabegron ER (MYRBETRIQ) 50 MG TB24 tablet, Take 1 tablet (50 mg total) by mouth daily. (Patient not taking: Reported on 05/08/2018), Disp: 30 tablet, Rfl: 11 No current facility-administered medications for this visit.   Facility-Administered Medications Ordered in Other Visits:  .  heparin lock flush 100 unit/mL, 500 Units, Intracatheter, Once PRN, Sindy Guadeloupe, MD  Physical exam:  Vitals:   05/08/18 1209  BP: 124/82  Pulse: 78  Resp: 16  Temp: 98 F (36.7 C)  TempSrc: Tympanic   Physical Exam Constitutional:      Appearance: She is obese.     Comments: She is sitting in a wheelchair  HENT:     Head: Normocephalic and atraumatic.  Eyes:     Pupils: Pupils are equal, round, and reactive to light.  Neck:     Musculoskeletal: Normal range of motion.  Cardiovascular:     Rate and Rhythm: Normal rate and regular rhythm.     Heart sounds: Normal heart sounds.  Pulmonary:     Effort: Pulmonary effort is normal.     Breath sounds: Normal breath sounds.  Abdominal:     General: Bowel sounds are normal.     Palpations: Abdomen is soft.     Comments: She has a chronic foley  Musculoskeletal:     Comments: B/l LE wounds  Skin:    General: Skin is warm and dry.  Neurological:     Mental  Status: She is alert and oriented to person, place, and time.      CMP Latest Ref Rng & Units 05/08/2018  Glucose 70 - 99 mg/dL 126(H)  BUN 8 - 23 mg/dL 18  Creatinine 0.44 - 1.00 mg/dL 1.26(H)  Sodium 135 - 145 mmol/L 142  Potassium 3.5 - 5.1 mmol/L 3.8  Chloride 98 - 111 mmol/L 104  CO2 22 - 32 mmol/L 30  Calcium 8.9 - 10.3 mg/dL 9.1  Total Protein 6.5 - 8.1 g/dL 7.1  Total Bilirubin 0.3 - 1.2 mg/dL 0.6  Alkaline Phos 38 - 126 U/L 83  AST 15 - 41 U/L 20  ALT 0 - 44 U/L 19   CBC Latest Ref Rng & Units 05/08/2018  WBC 4.0 - 10.5 K/uL 5.1  Hemoglobin 12.0 - 15.0 g/dL 12.1  Hematocrit 36.0 - 46.0 % 38.5  Platelets 150 - 400 K/uL 224    No images are attached to the encounter.  Ct Chest W Contrast  Result Date: 05/06/2018 CLINICAL DATA:  Lymphoma. Swelling along the groin. Prior radiation in immunotherapy. Multiple sclerosis. EXAM: CT CHEST, ABDOMEN, AND PELVIS WITH CONTRAST TECHNIQUE: Multidetector CT imaging of the chest, abdomen and pelvis was performed following the standard protocol during bolus administration of intravenous contrast. CONTRAST:  41m ISOVUE-300 IOPAMIDOL (ISOVUE-300) INJECTION 61% COMPARISON:  Multiple exams, including 05/21/2017 and PET-CT from 07/16/2016. FINDINGS: CT CHEST FINDINGS Cardiovascular: Coronary, aortic arch, and branch vessel atherosclerotic vascular disease. 2.2 by 1.4 cm fluid density structure along the anterior margin of the aortic arch is similar to prior and may represent a pericardial cyst or fluid in a superior pericardial  recess. Mediastinum/Nodes: A left axillary node measures 1.0 cm in short axis on image 20/2, formerly 0.9 cm. No overtly pathologic thoracic adenopathy. Lungs/Pleura: Similar peripheral interstitial accentuation in the lungs suggesting mild fibrosis. There is some peripheral scarring in the upper lobes similar to prior. Musculoskeletal: Unremarkable CT ABDOMEN PELVIS FINDINGS Hepatobiliary: Unremarkable Pancreas: Unremarkable  Spleen: Unremarkable.  No focal splenic lesion. Adrenals/Urinary Tract: 1.8 by 1.5 cm hypodense lesion of the right kidney upper pole posteriorly, by my measurements this was previously 1.6 by 1.6 cm on 05/21/2017, an without definite hypermetabolic activity on prior PET-CT. Foley catheter is present in the urinary bladder. Adrenal glands normal. Stomach/Bowel: Borderline prominence of the appendix, appendiceal diameter 0.7 cm. No periappendiceal inflammatory findings. Vascular/Lymphatic: Aortoiliac atherosclerotic vascular disease. A periaortic lymph node anterior to the abdominal aorta measures 0.9 cm in short axis on image 66/2, formerly the same. An aortocaval node measures 0.9 cm in short axis on image 74/2, formerly the same. A right common iliac node measures 1.1 cm in short axis on image 84/2, a right common iliac node measures 0.9 cm in short axis on image 84/2, stable. A right inguinal lymph node measures 1.1 cm in short axis on image 111/2, formerly the same. A left inguinal node measures 1.0 cm in short axis on image 100/2, stable. Additional small lymph nodes are stable. Reproductive: Approximately 5.3 by 3.5 cm cystic lesion of the left ovary, mildly reduced in size compared to the 05/21/2017 exam. Uterus unremarkable. Other: Stable stranding in the anterior inferior pannus, mild panniculitis not excluded. There is presacral stranding which is chronic and stable. Musculoskeletal: Small bilateral groin hernias contain adipose tissue. Prior compression fractures at T12, L2, and L3. Posterolateral rod and pedicle screw fixation L3-L4-S1 on the right and at L3-L4-L5 on the left. There is some lucency around the right S1 pedicle screw which may be from loosening or infection. Interbody fusion cage at the L3-4 level. Interbody graft at L4-5. Chronic fused grade 1 anterolisthesis at L4-5 with grade 1 degenerative retrolisthesis at L1-2 and L2-3 similar to prior. IMPRESSION: 1. No current overt pathologic  adenopathy although there are bilateral upper normal sized pelvic lymph nodes. No splenomegaly. 2. Mild chronic appearing panniculitis. 3. Small bilateral groin hernias containing adipose tissue. 4. Other imaging findings of potential clinical significance: Aortic Atherosclerosis (ICD10-I70.0). Coronary atherosclerosis. Probable small pericardial cyst or fluid in a superior pericardial recess, unchanged, adjacent to the aortic arch. Peripheral scarring/fibrosis in the lungs, mild. Suspected cyst of the right kidney upper pole, unchanged. Borderline prominence of the appendix, unchanged. Reduced size of the cystic lesion of the left ovary although still notable at 5.3 by 3.5 cm. Prior lower thoracic and lumbar compressions with scoliosis, spondylosis, degenerative disc disease, and extensive postoperative findings. Electronically Signed   By: Van Clines M.D.   On: 05/06/2018 16:04   Ct Abdomen Pelvis W Contrast  Result Date: 05/06/2018 CLINICAL DATA:  Lymphoma. Swelling along the groin. Prior radiation in immunotherapy. Multiple sclerosis. EXAM: CT CHEST, ABDOMEN, AND PELVIS WITH CONTRAST TECHNIQUE: Multidetector CT imaging of the chest, abdomen and pelvis was performed following the standard protocol during bolus administration of intravenous contrast. CONTRAST:  60m ISOVUE-300 IOPAMIDOL (ISOVUE-300) INJECTION 61% COMPARISON:  Multiple exams, including 05/21/2017 and PET-CT from 07/16/2016. FINDINGS: CT CHEST FINDINGS Cardiovascular: Coronary, aortic arch, and branch vessel atherosclerotic vascular disease. 2.2 by 1.4 cm fluid density structure along the anterior margin of the aortic arch is similar to prior and may represent a pericardial cyst or fluid  in a superior pericardial recess. Mediastinum/Nodes: A left axillary node measures 1.0 cm in short axis on image 20/2, formerly 0.9 cm. No overtly pathologic thoracic adenopathy. Lungs/Pleura: Similar peripheral interstitial accentuation in the lungs  suggesting mild fibrosis. There is some peripheral scarring in the upper lobes similar to prior. Musculoskeletal: Unremarkable CT ABDOMEN PELVIS FINDINGS Hepatobiliary: Unremarkable Pancreas: Unremarkable Spleen: Unremarkable.  No focal splenic lesion. Adrenals/Urinary Tract: 1.8 by 1.5 cm hypodense lesion of the right kidney upper pole posteriorly, by my measurements this was previously 1.6 by 1.6 cm on 05/21/2017, an without definite hypermetabolic activity on prior PET-CT. Foley catheter is present in the urinary bladder. Adrenal glands normal. Stomach/Bowel: Borderline prominence of the appendix, appendiceal diameter 0.7 cm. No periappendiceal inflammatory findings. Vascular/Lymphatic: Aortoiliac atherosclerotic vascular disease. A periaortic lymph node anterior to the abdominal aorta measures 0.9 cm in short axis on image 66/2, formerly the same. An aortocaval node measures 0.9 cm in short axis on image 74/2, formerly the same. A right common iliac node measures 1.1 cm in short axis on image 84/2, a right common iliac node measures 0.9 cm in short axis on image 84/2, stable. A right inguinal lymph node measures 1.1 cm in short axis on image 111/2, formerly the same. A left inguinal node measures 1.0 cm in short axis on image 100/2, stable. Additional small lymph nodes are stable. Reproductive: Approximately 5.3 by 3.5 cm cystic lesion of the left ovary, mildly reduced in size compared to the 05/21/2017 exam. Uterus unremarkable. Other: Stable stranding in the anterior inferior pannus, mild panniculitis not excluded. There is presacral stranding which is chronic and stable. Musculoskeletal: Small bilateral groin hernias contain adipose tissue. Prior compression fractures at T12, L2, and L3. Posterolateral rod and pedicle screw fixation L3-L4-S1 on the right and at L3-L4-L5 on the left. There is some lucency around the right S1 pedicle screw which may be from loosening or infection. Interbody fusion cage at the  L3-4 level. Interbody graft at L4-5. Chronic fused grade 1 anterolisthesis at L4-5 with grade 1 degenerative retrolisthesis at L1-2 and L2-3 similar to prior. IMPRESSION: 1. No current overt pathologic adenopathy although there are bilateral upper normal sized pelvic lymph nodes. No splenomegaly. 2. Mild chronic appearing panniculitis. 3. Small bilateral groin hernias containing adipose tissue. 4. Other imaging findings of potential clinical significance: Aortic Atherosclerosis (ICD10-I70.0). Coronary atherosclerosis. Probable small pericardial cyst or fluid in a superior pericardial recess, unchanged, adjacent to the aortic arch. Peripheral scarring/fibrosis in the lungs, mild. Suspected cyst of the right kidney upper pole, unchanged. Borderline prominence of the appendix, unchanged. Reduced size of the cystic lesion of the left ovary although still notable at 5.3 by 3.5 cm. Prior lower thoracic and lumbar compressions with scoliosis, spondylosis, degenerative disc disease, and extensive postoperative findings. Electronically Signed   By: Van Clines M.D.   On: 05/06/2018 16:04     Assessment and plan- Patient is a 68 y.o. female 1. patient is a 68 year old female who has had ongoing problems with pain in her left groin as well as his left leg swelling.   2. She had a Doppler of her left lower extremity on 02/28/2016 which showed: 1. Mild to moderate edema within the left calf. No evidence for acute DVT from left groin to popliteal fossa. Slightly limited evaluation of calf veins due to edema. 2. Left inguinal enlarged lymph nodes up to 6.2 cm, findings could be secondary to reactive node, infection, inflammation, or lymphoproliferative disease.  3. She had a repeat  Doppler on 05/24/2016 which showed: IMPRESSION: 1. No evidence of lower extremity deep vein thrombosis, left. 2. Left inguinal lymphadenopathy. Differential diagnosis: Reactive adenopathy, lymphoma, metastatic disease.  4.  CT abdomen on 06/14/2016 showed:  IMPRESSION: Lymphadenopathy within the left inguinal region and the left iliac chain. This is highly suspicious for underlying neoplasm. Biopsy is recommended for further evaluation.  5. Patient was seen by Dr. Jamal Collin from surgery and underwent excisional biopsy of the leftinguinal lymph node under ultrasound on 07/04/2016. Final pathology showed DIAGNOSIS:  A-B. LYMPH NODE, LEFT INGUINAL; EXCISION:  - FOLLICULAR LYMPHOMA, GRADE 1-2, PREDOMINANTLY FOLLICULAR.   6. Patient has multiple sclerosis and has h/o depression and anxiety. She was given clonopin this morning and currently patient is drowsy but able to be aroused. She drifts off to sleep in between conversation. She is here with her friend who helps her out. Patient lives with her husband at baseline and uses a walker to ambulate. Reports pain and discomfort in her left groin. Denies fevers, chills, night sweats or weight loss  7. PET/CT scan from 07/16/16 showed: IMPRESSION: 1. Pathologic and hypermetabolic lower retroperitoneal, pelvic, and inguinal adenopathy compatible with Deauville 5 malignancy. 2. Mildly prominent right lower paratracheal lymph node, Deauville 4. 3. Accentuated activity in the right lateral pterygoid muscle without CT correlate, probably incidental, merits attention on any follow up. 4. Other imaging findings of potential clinical significance: Airway thickening is present, suggesting bronchitis or reactive airways disease. Trace left pleural effusion. Coronary and aortoiliac atherosclerosis. Prominent stool throughout the colon favors constipation. Late phase healing fractures of left lower anterior ribs.  8. Bone marrow biopsy from 07/18/2016 did not show any evidence of lymphoma. HIV and hepatitis B and hepatitis C testing was negative  9. Patient received 5 doses of rituxan between April-July 2018. Also received RT to her inguinal region. She is here for routine  f/u of follicular lymphoma.    I have reviewed ct chest abdomen plevis images independently. There is no evidence of pathologic adenopathy. No splenomegaly. She has responded well to rituxan and does not require any treatment at this time.   I will see her back in 6 months- cbc/cmp and ldh. Repeat scans in 1 year   Visit Diagnosis 1. Encounter for follow-up surveillance of lymphoma      Dr. Randa Evens, MD, MPH [facility] [phone] 05/10/2018 5:28 PM

## 2018-05-12 ENCOUNTER — Ambulatory Visit: Payer: Medicare HMO | Admitting: Urology

## 2018-05-13 ENCOUNTER — Other Ambulatory Visit: Payer: Self-pay

## 2018-05-13 ENCOUNTER — Emergency Department: Payer: Medicare Other

## 2018-05-13 ENCOUNTER — Emergency Department
Admission: EM | Admit: 2018-05-13 | Discharge: 2018-05-15 | Disposition: A | Discharge status: Skilled Nursing Facility | Payer: Medicare Other | Attending: Emergency Medicine | Admitting: Emergency Medicine

## 2018-05-13 DIAGNOSIS — N183 Chronic kidney disease, stage 3 (moderate): Secondary | ICD-10-CM | POA: Insufficient documentation

## 2018-05-13 DIAGNOSIS — G35 Multiple sclerosis: Secondary | ICD-10-CM | POA: Insufficient documentation

## 2018-05-13 DIAGNOSIS — F32A Depression, unspecified: Secondary | ICD-10-CM

## 2018-05-13 DIAGNOSIS — G43909 Migraine, unspecified, not intractable, without status migrainosus: Secondary | ICD-10-CM | POA: Diagnosis present

## 2018-05-13 DIAGNOSIS — Y762 Prosthetic and other implants, materials and accessory obstetric and gynecological devices associated with adverse incidents: Secondary | ICD-10-CM | POA: Insufficient documentation

## 2018-05-13 DIAGNOSIS — F4325 Adjustment disorder with mixed disturbance of emotions and conduct: Secondary | ICD-10-CM | POA: Diagnosis present

## 2018-05-13 DIAGNOSIS — F331 Major depressive disorder, recurrent, moderate: Secondary | ICD-10-CM | POA: Diagnosis not present

## 2018-05-13 DIAGNOSIS — J449 Chronic obstructive pulmonary disease, unspecified: Secondary | ICD-10-CM | POA: Diagnosis not present

## 2018-05-13 DIAGNOSIS — G4733 Obstructive sleep apnea (adult) (pediatric): Secondary | ICD-10-CM | POA: Diagnosis not present

## 2018-05-13 DIAGNOSIS — N390 Urinary tract infection, site not specified: Secondary | ICD-10-CM | POA: Diagnosis not present

## 2018-05-13 DIAGNOSIS — I129 Hypertensive chronic kidney disease with stage 1 through stage 4 chronic kidney disease, or unspecified chronic kidney disease: Secondary | ICD-10-CM | POA: Diagnosis not present

## 2018-05-13 DIAGNOSIS — R600 Localized edema: Secondary | ICD-10-CM | POA: Insufficient documentation

## 2018-05-13 DIAGNOSIS — Z8572 Personal history of non-Hodgkin lymphomas: Secondary | ICD-10-CM | POA: Insufficient documentation

## 2018-05-13 DIAGNOSIS — G35D Multiple sclerosis, unspecified: Secondary | ICD-10-CM | POA: Diagnosis present

## 2018-05-13 DIAGNOSIS — R296 Repeated falls: Secondary | ICD-10-CM | POA: Insufficient documentation

## 2018-05-13 DIAGNOSIS — J441 Chronic obstructive pulmonary disease with (acute) exacerbation: Secondary | ICD-10-CM | POA: Diagnosis present

## 2018-05-13 DIAGNOSIS — F329 Major depressive disorder, single episode, unspecified: Secondary | ICD-10-CM | POA: Diagnosis not present

## 2018-05-13 DIAGNOSIS — T83511A Infection and inflammatory reaction due to indwelling urethral catheter, initial encounter: Secondary | ICD-10-CM | POA: Insufficient documentation

## 2018-05-13 DIAGNOSIS — L97929 Non-pressure chronic ulcer of unspecified part of left lower leg with unspecified severity: Secondary | ICD-10-CM | POA: Insufficient documentation

## 2018-05-13 DIAGNOSIS — Z87891 Personal history of nicotine dependence: Secondary | ICD-10-CM | POA: Diagnosis not present

## 2018-05-13 DIAGNOSIS — R05 Cough: Secondary | ICD-10-CM | POA: Diagnosis not present

## 2018-05-13 DIAGNOSIS — L97919 Non-pressure chronic ulcer of unspecified part of right lower leg with unspecified severity: Secondary | ICD-10-CM | POA: Insufficient documentation

## 2018-05-13 DIAGNOSIS — R531 Weakness: Secondary | ICD-10-CM | POA: Diagnosis not present

## 2018-05-13 DIAGNOSIS — W19XXXA Unspecified fall, initial encounter: Secondary | ICD-10-CM

## 2018-05-13 LAB — COMPREHENSIVE METABOLIC PANEL
ALT: 16 U/L (ref 0–44)
AST: 16 U/L (ref 15–41)
Albumin: 3.8 g/dL (ref 3.5–5.0)
Alkaline Phosphatase: 86 U/L (ref 38–126)
Anion gap: 7 (ref 5–15)
BUN: 19 mg/dL (ref 8–23)
CO2: 27 mmol/L (ref 22–32)
Calcium: 9.2 mg/dL (ref 8.9–10.3)
Chloride: 103 mmol/L (ref 98–111)
Creatinine, Ser: 1.32 mg/dL — ABNORMAL HIGH (ref 0.44–1.00)
GFR calc Af Amer: 48 mL/min — ABNORMAL LOW (ref >60)
GFR calc non Af Amer: 42 mL/min — ABNORMAL LOW (ref >60)
Glucose, Bld: 108 mg/dL — ABNORMAL HIGH (ref 70–99)
Potassium: 3.5 mmol/L (ref 3.5–5.1)
Sodium: 137 mmol/L (ref 135–145)
Total Bilirubin: 0.9 mg/dL (ref 0.3–1.2)
Total Protein: 7 g/dL (ref 6.5–8.1)

## 2018-05-13 LAB — URINALYSIS, COMPLETE (UACMP) WITH MICROSCOPIC
Bilirubin Urine: NEGATIVE
Glucose, UA: NEGATIVE mg/dL
Hgb urine dipstick: NEGATIVE
Ketones, ur: NEGATIVE mg/dL
Nitrite: POSITIVE — AB
Protein, ur: 30 mg/dL — AB
Specific Gravity, Urine: 1.023 (ref 1.005–1.030)
WBC, UA: >50 WBC/hpf — ABNORMAL HIGH (ref 0–5)
pH: 6 (ref 5.0–8.0)

## 2018-05-13 LAB — CBC WITH DIFFERENTIAL/PLATELET
Abs Immature Granulocytes: 0.05 10*3/uL (ref 0.00–0.07)
Basophils Absolute: 0.1 10*3/uL (ref 0.0–0.1)
Basophils Relative: 1 %
Eosinophils Absolute: 0.1 10*3/uL (ref 0.0–0.5)
Eosinophils Relative: 1 %
HCT: 38.5 % (ref 36.0–46.0)
Hemoglobin: 12.2 g/dL (ref 12.0–15.0)
Immature Granulocytes: 1 %
Lymphocytes Relative: 5 %
Lymphs Abs: 0.5 10*3/uL — ABNORMAL LOW (ref 0.7–4.0)
MCH: 28.7 pg (ref 26.0–34.0)
MCHC: 31.7 g/dL (ref 30.0–36.0)
MCV: 90.6 fL (ref 80.0–100.0)
Monocytes Absolute: 0.7 10*3/uL (ref 0.1–1.0)
Monocytes Relative: 7 %
Neutro Abs: 9.1 10*3/uL — ABNORMAL HIGH (ref 1.7–7.7)
Neutrophils Relative %: 85 %
Platelets: 221 10*3/uL (ref 150–400)
RBC: 4.25 MIL/uL (ref 3.87–5.11)
RDW: 14.3 % (ref 11.5–15.5)
WBC: 10.5 10*3/uL (ref 4.0–10.5)
nRBC: 0 % (ref 0.0–0.2)

## 2018-05-13 LAB — INFLUENZA PANEL BY PCR (TYPE A & B)
Influenza A By PCR: NEGATIVE
Influenza B By PCR: NEGATIVE

## 2018-05-13 LAB — TROPONIN I: Troponin I: <0.03 ng/mL (ref <0.03)

## 2018-05-13 MED ORDER — CLONAZEPAM 0.5 MG PO TABS
1.0000 mg | ORAL_TABLET | Freq: Every day | ORAL | Status: DC
  Filled 2018-05-13: qty 2

## 2018-05-13 MED ORDER — QUETIAPINE FUMARATE ER 50 MG PO TB24
150.0000 mg | ORAL_TABLET | Freq: Every day | ORAL | Status: DC
  Administered 2018-05-13 – 2018-05-14 (×2): 150 mg via ORAL
  Filled 2018-05-13 (×3): qty 3

## 2018-05-13 MED ORDER — KETOROLAC TROMETHAMINE 30 MG/ML IJ SOLN
15.0000 mg | Freq: Once | INTRAMUSCULAR | Status: AC
  Administered 2018-05-13: 15 mg via INTRAVENOUS
  Filled 2018-05-13: qty 1

## 2018-05-13 MED ORDER — CLONAZEPAM 0.5 MG PO TABS
1.0000 mg | ORAL_TABLET | Freq: Every day | ORAL | Status: DC
  Administered 2018-05-13 – 2018-05-14 (×2): 1 mg via ORAL
  Filled 2018-05-13: qty 2

## 2018-05-13 MED ORDER — SODIUM CHLORIDE 0.9 % IV SOLN
1.0000 g | Freq: Once | INTRAVENOUS | Status: AC
  Administered 2018-05-13: 1 g via INTRAVENOUS
  Filled 2018-05-13: qty 10

## 2018-05-13 MED ORDER — CIPROFLOXACIN HCL 500 MG PO TABS
500.0000 mg | ORAL_TABLET | Freq: Two times a day (BID) | ORAL | Status: DC
  Administered 2018-05-13 – 2018-05-15 (×4): 500 mg via ORAL
  Filled 2018-05-13 (×4): qty 1
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
## 2018-05-13 NOTE — Evaluation (Signed)
Physical Therapy Evaluation Patient Details Name: [name] MRN: [id number] DOB: 1950-07-15 Today's Date: 05/13/2018   History of Present Illness  Patient is a 68 year old female who comes to the ED with c/o fatigue, multiple falls in the past few weeks and SI.  Pt diagnosed with UTI.  PMH includes MS (relapsing remitting, per pt), lymphoma, COPD, depression, ADHA and abdominal aortic atherosclerosis.  Clinical Impression  Pt is a 68 year old female who lives in a one story home with her husband.  Pt has home health services and uses a 4WW for household ambulation at baseline.  Pt in bed reporting generalized pain.  PT noted L LL bandaged and pt stated that her L LE does not feel pain but feels weaker.  MMT revealed generalized weakness but no asymmetry.  Pt attempted supine to sit but was unable to achieve upright posture with heavy assistance.  Pt stated that she was "too weak" for further attempt.  Pt visibly fatigued and restless during evaluation.  Vitals remained WNL.  Pt will continue to benefit from skilled PT with focus on strength, tolerance to activity and safe functional mobility.  Due to mobility deficits compared to baseline and decreased caregiver support at home, PT recommends SNF for discharge planning.    Follow Up Recommendations SNF    Equipment Recommendations  None recommended by PT    Recommendations for Other Services       Precautions / Restrictions Precautions Precautions: Fall Precaution Comments: Multiple falls since Christmas Restrictions Weight Bearing Restrictions: No      Mobility  Bed Mobility Overal bed mobility: Needs Assistance Bed Mobility: Supine to Sit     Supine to sit: Max assist;HOB elevated     General bed mobility comments: Pt attempted to perform supine to sit but was unable to achieve upright posture.  PT cued to bring LE's over EOB and then push up and pt stated that she was just "too weak" to do so at this time.  Transfers                     Ambulation/Gait                Stairs            Wheelchair Mobility    Modified Rankin (Stroke Patients Only)       Balance                                             Pertinent Vitals/Pain Pain Assessment: Faces Faces Pain Scale: Hurts even more Pain Location: Pt was unable to pinpoint pain and stated that "it hurts all over". Pain Descriptors / Indicators: Aching Pain Intervention(s): Limited activity within patient's tolerance    Home Living Family/patient expects to be discharged to:: Private residence Living Arrangements: Spouse/significant other Available Help at Discharge: Family;Available 24 hours/day(Per pt, husband has health concerns of his own and is not able to assist pt with mobility.) Type of Home: House Home Access: Stairs to enter Entrance Stairs-Rails: Can reach both Entrance Stairs-Number of Steps: 4 Home Layout: One level Home Equipment: Walker - 4 wheels      Prior Function Level of Independence: Needs assistance   Gait / Transfers Assistance Needed: Pt uses a 4WW for limited household distances; sleeps in a lift chair which assists her with STS transfers.  Hand Dominance        Extremity/Trunk Assessment   Upper Extremity Assessment Upper Extremity Assessment: Overall WFL for tasks assessed    Lower Extremity Assessment Lower Extremity Assessment: Overall WFL for tasks assessed(Pt stated that L LE felt "weaker" though MMT in supine did not reflect this.  B LE grossly 4-/5 )       Communication   Communication: No difficulties  Cognition Arousal/Alertness: Awake/alert Behavior During Therapy: Restless Overall Cognitive Status: No family/caregiver present to determine baseline cognitive functioning                                 General Comments: Pt able to conversate and follow directions.      General Comments      Exercises      Assessment/Plan    PT Assessment Patient needs continued PT services  PT Problem List Decreased strength;Decreased activity tolerance;Decreased balance;Decreased mobility;Pain       PT Treatment Interventions DME instruction;Therapeutic exercise;Gait training;Balance training;Stair training;Functional mobility training;Therapeutic activities;Patient/family education    PT Goals (Current goals can be found in the Care Plan section)  Acute Rehab PT Goals Patient Stated Goal: To regain strength in order to take care of herself without a caregiver. PT Goal Formulation: With patient Time For Goal Achievement: 05/27/18 Potential to Achieve Goals: Good    Frequency Min 2X/week   Barriers to discharge        Co-evaluation               AM-PAC PT "6 Clicks" Mobility  Outcome Measure Help needed turning from your back to your side while in a flat bed without using bedrails?: A Lot Help needed moving from lying on your back to sitting on the side of a flat bed without using bedrails?: A Lot Help needed moving to and from a bed to a chair (including a wheelchair)?: A Lot Help needed standing up from a chair using your arms (e.g., wheelchair or bedside chair)?: A Lot Help needed to walk in hospital room?: A Lot Help needed climbing 3-5 steps with a railing? : Total 6 Click Score: 11    End of Session   Activity Tolerance: Patient limited by fatigue Patient left: in bed;with call bell/phone within reach Nurse Communication: Mobility status PT Visit Diagnosis: History of falling (Z91.81);Repeated falls (R29.6);Muscle weakness (generalized) (M62.81)    Time: 1310-1330 PT Time Calculation (min) (ACUTE ONLY): 20 min   Charges:   PT Evaluation $PT Eval Moderate Complexity: 1 Mod          Roxanne Gates, PT, DPT   Roxanne Gates 05/13/2018, 2:01 PM

## 2018-05-13 NOTE — ED Triage Notes (Signed)
Pt comes from home via [facility] after a fall and flu-like symptoms. Pt has fallen about 6 times since Christmas. Pt c/o some hip pain. Pt has been seen by home health and are starting PT. Pt also reports thoughts of hurting herself with hx of depression and taking psych meds.

## 2018-05-13 NOTE — ED Notes (Signed)
Family at bedside. 

## 2018-05-13 NOTE — ED Notes (Signed)
CSW in room.

## 2018-05-13 NOTE — ED Notes (Signed)
PT contacted and aware of PT consult. Sending someone after lunch.

## 2018-05-13 NOTE — ED Notes (Signed)
Psychiatrist at bedside

## 2018-05-13 NOTE — ED Notes (Signed)
Pt is resting in the bed at this time.

## 2018-05-13 NOTE — ED Notes (Signed)
Pt ate 75% of meal tray   

## 2018-05-13 NOTE — ED Notes (Signed)
Pt is given a phone to use.

## 2018-05-13 NOTE — ED Notes (Signed)
PT with pt now

## 2018-05-13 NOTE — ED Notes (Signed)
Pt states urinary catheter was changed x1wk ago, unable to obtain any urine from port at this time. Will try again

## 2018-05-13 NOTE — ED Notes (Signed)
Pt reports being depressed and having SI, as noted in triage. Pt states "when my head hurts like this it just makes you want to blow your brains out" but denies a plan. Pt also states that she has felt like this for a long time and has been on medications for it. MD notified at this time and social work consulted.

## 2018-05-13 NOTE — NC FL2 (Signed)
[place] LEVEL OF CARE SCREENING TOOL     IDENTIFICATION  Patient Name: [name] Birthdate: [birth year] Sex: female Admission Date (Current Location): 05/13/2018  [place] and [place] Number:  Engineering geologist and Address:  [facility], [address] [place], [place] [address]      Provider Number: [id number]  Attending Physician Name and Address:  Earleen Newport, MD  Relative Name and Phone Number:  Spouse Camy Leder [phone]    Current Level of Care: Hospital Recommended Level of Care: [place] Prior Approval Number:    Date Approved/Denied:   PASRR Number:    Discharge Plan: SNF    Current Diagnoses: Patient Active Problem List   Diagnosis Date Noted  . Fall 05/13/2018  . Depression 05/13/2018  . Recurrent cellulitis of lower extremity 06/25/2017  . Medication monitoring encounter 06/05/2017  . Cellulitis of left lower leg 04/25/2017  . Adjustment disorder with mixed disturbance of emotions and conduct 04/23/2017  . Foot pain, bilateral 02/03/2017  . Tinea pedis 02/03/2017  . Left ovarian cyst 12/09/2016  . Abdominal aortic atherosclerosis ([place]) 11/11/2016  . Swelling of lower extremity 10/11/2016  . Obstructive sleep apnea 10/11/2016  . Follicular lymphoma of intra-abdominal lymph nodes ([place]) 08/06/2016  . Multiple falls 06/07/2016  . Inguinal adenopathy 05/29/2016  . Obesity, morbid ([place]) 01/05/2016  . Low HDL (under 40) 12/19/2015  . Skin ulcer ([place]) 11/08/2015  . Peripheral vascular disease of lower extremity with ulceration ([place]) 11/08/2015  . CKD (chronic kidney disease) stage 3, GFR 30-59 ml/min (HCC) 11/08/2015  . Constipation due to pain medication 01/31/2015  . Obstructive sleep apnea of adult 01/13/2015  . Pelvic muscle wasting 01/13/2015  . Incomplete bladder emptying 01/13/2015  . Headache, migraine 10/24/2014  . 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 Current tobacco use  10/24/2014  . Lumbar radiculopathy, chronic 10/02/2013  . COPD with bronchial hyperresponsiveness ([place]) 10/02/2013  . Major depressive disorder, recurrent, in partial remission ([place]) 10/02/2013  . Hypertension goal BP (blood pressure) < 140/90 10/02/2013  . Multiple sclerosis ([place]) 10/02/2013  . Absence of bladder continence 09/25/2012  . Acontractile bladder 02/12/2012  . Narrowing of intervertebral disc space 11/26/2011  . Kyphoscoliosis and scoliosis 11/26/2011    Orientation RESPIRATION BLADDER Height & Weight     Self, Time, Situation, Place  Normal Indwelling catheter Weight: 280 lb (127 kg) Height:  5' (152.4 cm)  BEHAVIORAL SYMPTOMS/MOOD NEUROLOGICAL BOWEL NUTRITION STATUS      Continent Diet(Normal Diet)  AMBULATORY STATUS COMMUNICATION OF NEEDS Skin   Extensive Assist Verbally Normal                       Personal Care Assistance Level of Assistance  Bathing, Feeding, Dressing Bathing Assistance: Maximum assistance Feeding assistance: Limited assistance Dressing Assistance: Maximum assistance     Functional Limitations Info  Sight, Hearing, Speech Sight Info: Adequate Hearing Info: Adequate Speech Info: Adequate    SPECIAL CARE FACTORS FREQUENCY  PT (By licensed PT)     PT Frequency: 5x              Contractures Contractures Info: Not present    Additional Factors Info  Psychotropic Code Status Info: Full Allergies Info: No Known Allergies Psychotropic Info: See med list         Current Medications (05/13/2018):  This is the current hospital active medication list Current Facility-Administered Medications  Medication Dose Route Frequency Provider Last Rate Last Dose  .  ciprofloxacin (CIPRO) tablet 500 mg  500 mg Oral BID Earleen Newport, MD       Current Outpatient Medications  Medication Sig Dispense Refill  . atorvastatin (LIPITOR) 10 MG tablet Take 1 tablet (10 mg total) by mouth at bedtime. 30 tablet 1  . BIOTIN PO Take by mouth  daily. Pt taking 500 mcg daily    . buPROPion (WELLBUTRIN XL) 300 MG 24 hr tablet Take 300 mg by mouth every morning.     . clonazePAM (KLONOPIN) 1 MG tablet Take 1 tablet (1 mg total) by mouth at bedtime. 5 tablet 0  . DULoxetine (CYMBALTA) 60 MG capsule Take 1 capsule (60 mg total) by mouth every morning. 30 capsule 3  . furosemide (LASIX) 20 MG tablet Take 1 tablet (20 mg total) by mouth daily. STOP HCTZ 30 tablet 0  . gabapentin (NEURONTIN) 600 MG tablet Take 1 tablet (600 mg total) by mouth 3 (three) times daily.    . hydrALAZINE (APRESOLINE) 50 MG tablet Take 1 tablet (50 mg total) by mouth 3 (three) times daily. (Patient taking differently: Take 50 mg by mouth 2 (two) times daily. ) 90 tablet 5  . interferon beta-1a (AVONEX) 30 MCG/0.5ML PSKT injection Inject 30 mcg into the muscle every Monday.    . methylphenidate (RITALIN) 10 MG tablet Take 1 tablet (10 mg total) by mouth 2 (two) times daily.    . Multiple Vitamin (MULTIVITAMIN WITH MINERALS) TABS tablet Take 1 tablet by mouth every evening.     . polyethylene glycol powder (GLYCOLAX/MIRALAX) powder Take 17 g by mouth daily as needed for mild constipation. Mixed in water (Patient taking differently: Take 17 g by mouth daily as needed for mild constipation. For constipation)    . potassium chloride (KLOR-CON 10) 10 MEQ tablet Take 1 tablet (10 mEq total) by mouth daily. 30 tablet 0  . QUEtiapine Fumarate (SEROQUEL XR) 150 MG 24 hr tablet Take 150 mg by mouth at bedtime.   4  . vitamin C (ASCORBIC ACID) 500 MG tablet Take 500 mg by mouth every evening.      Facility-Administered Medications Ordered in Other Encounters  Medication Dose Route Frequency Provider Last Rate Last Dose  . heparin lock flush 100 unit/mL  500 Units Intracatheter Once PRN Sindy Guadeloupe, MD         Discharge Medications: Please see discharge summary for a list of discharge medications.  Relevant Imaging Results:  Relevant Lab Results:   Additional  Information SSN: [id number]  Truitt Merle, LCSW

## 2018-05-13 NOTE — NC FL2 (Signed)
[place] LEVEL OF CARE SCREENING TOOL     IDENTIFICATION  Patient Name: [name] Birthdate: 09-17-1950 Sex: female Admission Date (Current Location): 05/13/2018  [place] and [place] Number:  Engineering geologist and Address:  [facility], [address], [place], [place] [address]      Provider Number: [id number]  Attending Physician Name and Address:  Earleen Newport, MD  Relative Name and Phone Number:  Spouse Circe Chilton [phone]    Current Level of Care: Hospital Recommended Level of Care: [place] Prior Approval Number:    Date Approved/Denied:   PASRR Number:    Discharge Plan: SNF    Current Diagnoses: Patient Active Problem List   Diagnosis Date Noted  . Fall 05/13/2018  . Depression 05/13/2018  . Recurrent cellulitis of lower extremity 06/25/2017  . Medication monitoring encounter 06/05/2017  . Cellulitis of left lower leg 04/25/2017  . Adjustment disorder with mixed disturbance of emotions and conduct 04/23/2017  . Foot pain, bilateral 02/03/2017  . Tinea pedis 02/03/2017  . Left ovarian cyst 12/09/2016  . Abdominal aortic atherosclerosis ([place]) 11/11/2016  . Swelling of lower extremity 10/11/2016  . Obstructive sleep apnea 10/11/2016  . Follicular lymphoma of intra-abdominal lymph nodes ([place]) 08/06/2016  . Multiple falls 06/07/2016  . Inguinal adenopathy 05/29/2016  . Obesity, morbid ([place]) 01/05/2016  . Low HDL (under 40) 12/19/2015  . Skin ulcer ([place]) 11/08/2015  . Peripheral vascular disease of lower extremity with ulceration ([place]) 11/08/2015  . CKD (chronic kidney disease) stage 3, GFR 30-59 ml/min (HCC) 11/08/2015  . Constipation due to pain medication 01/31/2015  . Obstructive sleep apnea of adult 01/13/2015  . Pelvic muscle wasting 01/13/2015  . Incomplete bladder emptying 01/13/2015  . Headache, migraine 10/24/2014  . 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 Current tobacco use  10/24/2014  . Lumbar radiculopathy, chronic 10/02/2013  . COPD with bronchial hyperresponsiveness ([place]) 10/02/2013  . Major depressive disorder, recurrent, in partial remission ([place]) 10/02/2013  . Hypertension goal BP (blood pressure) < 140/90 10/02/2013  . Multiple sclerosis ([place]) 10/02/2013  . Absence of bladder continence 09/25/2012  . Acontractile bladder 02/12/2012  . Narrowing of intervertebral disc space 11/26/2011  . Kyphoscoliosis and scoliosis 11/26/2011    Orientation RESPIRATION BLADDER Height & Weight     Self, Time, Situation, Place  Normal Indwelling catheter Weight: 280 lb (127 kg) Height:  5' (152.4 cm)  BEHAVIORAL SYMPTOMS/MOOD NEUROLOGICAL BOWEL NUTRITION STATUS      Continent Diet(Normal Diet)  AMBULATORY STATUS COMMUNICATION OF NEEDS Skin   Extensive Assist Verbally Normal                       Personal Care Assistance Level of Assistance  Bathing, Feeding, Dressing Bathing Assistance: Maximum assistance Feeding assistance: Limited assistance Dressing Assistance: Maximum assistance     Functional Limitations Info  Sight, Hearing, Speech Sight Info: Adequate Hearing Info: Adequate Speech Info: Adequate    SPECIAL CARE FACTORS FREQUENCY  PT (By licensed PT)     PT Frequency: 5x              Contractures Contractures Info: Not present    Additional Factors Info  Code Status, Allergies Code Status Info: Full Allergies Info: No Known Allergies           Current Medications (05/13/2018):  This is the current hospital active medication list Current Facility-Administered Medications  Medication Dose Route Frequency Provider Last Rate Last Dose  .  ciprofloxacin (CIPRO) tablet 500 mg  500 mg Oral BID Earleen Newport, MD       Current Outpatient Medications  Medication Sig Dispense Refill  . atorvastatin (LIPITOR) 10 MG tablet Take 1 tablet (10 mg total) by mouth at bedtime. 30 tablet 1  . BIOTIN PO Take by mouth daily. Pt taking  500 mcg daily    . buPROPion (WELLBUTRIN XL) 300 MG 24 hr tablet Take 300 mg by mouth every morning.     . clonazePAM (KLONOPIN) 1 MG tablet Take 1 tablet (1 mg total) by mouth at bedtime. 5 tablet 0  . DULoxetine (CYMBALTA) 60 MG capsule Take 1 capsule (60 mg total) by mouth every morning. 30 capsule 3  . furosemide (LASIX) 20 MG tablet Take 1 tablet (20 mg total) by mouth daily. STOP HCTZ 30 tablet 0  . gabapentin (NEURONTIN) 600 MG tablet Take 1 tablet (600 mg total) by mouth 3 (three) times daily.    . hydrALAZINE (APRESOLINE) 50 MG tablet Take 1 tablet (50 mg total) by mouth 3 (three) times daily. (Patient taking differently: Take 50 mg by mouth 2 (two) times daily. ) 90 tablet 5  . interferon beta-1a (AVONEX) 30 MCG/0.5ML PSKT injection Inject 30 mcg into the muscle every Monday.    . methylphenidate (RITALIN) 10 MG tablet Take 1 tablet (10 mg total) by mouth 2 (two) times daily.    . Multiple Vitamin (MULTIVITAMIN WITH MINERALS) TABS tablet Take 1 tablet by mouth every evening.     . polyethylene glycol powder (GLYCOLAX/MIRALAX) powder Take 17 g by mouth daily as needed for mild constipation. Mixed in water (Patient taking differently: Take 17 g by mouth daily as needed for mild constipation. For constipation)    . potassium chloride (KLOR-CON 10) 10 MEQ tablet Take 1 tablet (10 mEq total) by mouth daily. 30 tablet 0  . QUEtiapine Fumarate (SEROQUEL XR) 150 MG 24 hr tablet Take 150 mg by mouth at bedtime.   4  . vitamin C (ASCORBIC ACID) 500 MG tablet Take 500 mg by mouth every evening.      Facility-Administered Medications Ordered in Other Encounters  Medication Dose Route Frequency Provider Last Rate Last Dose  . heparin lock flush 100 unit/mL  500 Units Intracatheter Once PRN Sindy Guadeloupe, MD         Discharge Medications: Please see discharge summary for a list of discharge medications.  Relevant Imaging Results:  Relevant Lab Results:   Additional Information SSN:  [id number]  Truitt Merle, LCSW

## 2018-05-13 NOTE — Clinical Social Work Note (Addendum)
Clinical Social Worker (CSW) received consult "Weak, will need placement." CSW staffed with EDP Dr. Jimmye Norman.   CSW met with patient and husband at bedside. Patient from home with husband and receives home health physical therapy through [facility]. Per patient she received short-term rehab at [facility] (SNF) about a year ago for 21 days. Husband stated patient has had 5 falls in the past few months and "has not been doing the exercises." When CSW asked patient multiple times during the conversation how CSW can assist, patient stated she would like to get her teeth pulled but doesn't want to go to the dentist, would like a smart phone like her husband but she has a flip phone, would like new shoes like other people. CSW continually redirected patient, assessing her motivation. Patient states she is on medication for depression and "feels it helps me to get out of the bed every morning." Patient states she would rather stay home, but feels it "isn't fair" that husband has to "take care" of her and all household needs.   CSW discussed several options with patient including: Short-term rehab, [facility] (LTC) & LTC Medicaid, [facility], increasing home health hours and looking to add In-Home Aide/Personal Care services. Patient and husband would like to move forward with short-term rehab. CSW made patient aware that placement is contingent upon Physical Therapy (P/T) recommendation and insurance authorization. CSW updated EDP Dr. Jimmye Norman, who already added a psych consult and a P/T consult. CSW awaiting evaluation recommendations.   UPDATE: CSW received a call from patient's spouse [name] and live-in friend [name] asking for an update. CSW informed still awaiting P/T recommendation and will update as information is received.   UPDATE: P/T recommending SNF. Patient seen and cleared by psychiatrist. CSW updated patient and provided SNF list. Patient agreeable to a local fax out. CSW  completed FL-2 and PASRR request (manual review). CSW faxed out patient. CSW spoke with admissions coordinator Tammy at [facility] requesting review of patient referral. Tammy stated [facility] declined patient. CSW updated patient and called spouse x3 to update- no answer. CSW left a Advertising account executive for [employer] at [facility] (2nd choice). CSW continuing to follow for transition of care needs.   Oretha Ellis, Latanya Presser, [place] Worker-Emergency Department [phone]

## 2018-05-13 NOTE — ED Provider Notes (Signed)
[facility] Emergency Department Provider Note       Time seen: ----------------------------------------- 8:17 AM on 05/13/2018 -----------------------------------------   I have reviewed the triage vital signs and the nursing notes.  HISTORY   Chief Complaint Fall   HPI [name] is a 68 y.o. female with a history of COPD, depression, hypertension, multiple sclerosis and generalized weakness who presents to the ED for persistent cough and weakness.  Patient reports she has been sick for about a week, she is not sure if she has flu or pneumonia.  She typically walks with a Rollator, reports she has been more weak than normal.  She has also had multiple recent falls from weakness.  Husband is requesting social work consult.  Past Medical History:  Diagnosis Date  . Abdominal aortic atherosclerosis ([place]) 11/11/2016  . ADHD   . Anxiety   . COPD (chronic obstructive pulmonary disease) ([place])   . Depression    major depressive  . Dyspnea    doe  . Edema    left leg  . Follicular lymphoma ([place])    B Cell  . Follicular lymphoma grade II ([place])   . Hypertension   . Hypotension    idiopathic  . Kyphoscoliosis and scoliosis 11/26/2011  . Morbid obesity ([place]) 01/05/2016  . Multiple sclerosis ([place])   . Multiple sclerosis ([place])    1980's  . Neuromuscular disorder ([place])   . Obstructive and reflux uropathy    foley  . Pain    atypical facial  . Peripheral vascular disease of lower extremity with ulceration ([place]) 11/08/2015  . Skin ulcer ([place]) 11/08/2015  . Weakness    generalized. has MS    Patient Active Problem List   Diagnosis Date Noted  . Recurrent cellulitis of lower extremity 06/25/2017  . Medication monitoring encounter 06/05/2017  . Cellulitis of left lower leg 04/25/2017  . Adjustment disorder with mixed disturbance of emotions and conduct 04/23/2017  . Foot pain, bilateral 02/03/2017  . Tinea pedis 02/03/2017  . Left ovarian cyst  12/09/2016  . Abdominal aortic atherosclerosis ([place]) 11/11/2016  . Swelling of lower extremity 10/11/2016  . Obstructive sleep apnea 10/11/2016  . Follicular lymphoma of intra-abdominal lymph nodes ([place]) 08/06/2016  . Multiple falls 06/07/2016  . Inguinal adenopathy 05/29/2016  . Obesity, morbid ([place]) 01/05/2016  . Low HDL (under 40) 12/19/2015  . Skin ulcer ([place]) 11/08/2015  . Peripheral vascular disease of lower extremity with ulceration ([place]) 11/08/2015  . CKD (chronic kidney disease) stage 3, GFR 30-59 ml/min (HCC) 11/08/2015  . Constipation due to pain medication 01/31/2015  . Obstructive sleep apnea of adult 01/13/2015  . Pelvic muscle wasting 01/13/2015  . Incomplete bladder emptying 01/13/2015  . Headache, migraine 10/24/2014  . 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 Current tobacco use 10/24/2014  . Lumbar radiculopathy, chronic 10/02/2013  . COPD with bronchial hyperresponsiveness ([place]) 10/02/2013  . Major depressive disorder, recurrent, in partial remission ([place]) 10/02/2013  . Hypertension goal BP (blood pressure) < 140/90 10/02/2013  . Multiple sclerosis ([place]) 10/02/2013  . Absence of bladder continence 09/25/2012  . Acontractile bladder 02/12/2012  . Narrowing of intervertebral disc space 11/26/2011  . Kyphoscoliosis and scoliosis 11/26/2011    Past Surgical History:  Procedure Laterality Date  . BACK SURGERY N/A 2002  . CYST EXCISION     lower back  . INGUINAL LYMPH NODE BIOPSY Left 07/04/2016   Procedure: INGUINAL LYMPH NODE BIOPSY;  Surgeon: Christene Lye, MD;  Location: [facility] ORS;  Service: General;  Laterality: Left;  . PORTACATH PLACEMENT N/A 07/22/2016   Procedure: INSERTION PORT-A-CATH;  Surgeon: Christene Lye, MD;  Location: [facility] ORS;  Service: General;  Laterality: N/A;  . TONSILLECTOMY AND ADENOIDECTOMY    . TUBAL LIGATION      Allergies Patient has no known allergies.  Social History Social History   Tobacco Use  . Smoking status:  Former Smoker    Packs/day: 1.00    Years: 20.00    Pack years: 20.00    Types: Cigarettes    Start date: 04/30/1995    Last attempt to quit: 02/03/2016    Years since quitting: 2.2  . Smokeless tobacco: Never Used  Substance Use Topics  . Alcohol use: No    Alcohol/week: 0.0 standard drinks  . Drug use: Yes    Types: Marijuana    Comment: smokes THC occasionally per pt    Review of Systems Constitutional: Negative for fever. Cardiovascular: Negative for chest pain. Respiratory: Positive for shortness of breath and cough Gastrointestinal: Negative for abdominal pain, vomiting and diarrhea. Musculoskeletal: Negative for back pain. Skin: Positive for ulcerations on her legs Neurological: Positive for generalized weakness  All systems negative/normal/unremarkable except as stated in the HPI  ____________________________________________   PHYSICAL EXAM:  VITAL SIGNS: ED Triage Vitals  Enc Vitals Group     BP      Pulse      Resp      Temp      Temp src      SpO2      Weight      Height      Head Circumference      [place] Flow      Pain Score      Pain Loc      Pain Edu?      Excl. in [place]?    Constitutional: Alert and oriented.  No acute distress Eyes: Conjunctivae are normal. Normal extraocular movements. ENT      Head: Normocephalic and atraumatic.      Nose: No congestion/rhinnorhea.      Mouth/Throat: Mucous membranes are moist.      Neck: No stridor. Cardiovascular: Normal rate, regular rhythm. No murmurs, rubs, or gallops. Respiratory: Normal respiratory effort without tachypnea nor retractions.  Scattered rhonchi Gastrointestinal: Soft and nontender. Normal bowel sounds Musculoskeletal: Nontender with normal range of motion in extremities.  Bilateral lower extremity edema Neurologic:  Normal speech and language. No gross focal neurologic deficits are appreciated.  Generalized weakness, nothing focal Skin: Skin ulcerations are present on the lower leg, Unna  boot dressing on the left leg below the knee Psychiatric: Mood and affect are normal. Speech and behavior are normal.  ____________________________________________  EKG: Interpreted by me.  Sinus rhythm rate of 60 bpm, left anterior fascicular block, low voltage, nonspecific T abnormalities, normal QT  ____________________________________________  ED COURSE:  As part of my medical decision making, I reviewed the following data within the [place] History obtained from family if available, nursing notes, old chart and ekg, as well as notes from prior ED visits. Patient presented for generalized weakness, recent falls and flulike symptoms, we will assess with labs and imaging as indicated at this time. Clinical Course as of May 13 1017  Wed May 13, 2018  1019 Foley was changed a week ago.  We will proceed with urine culture and IV Rocephin.  Psychiatry has been consulted as well as social work.   [JW]  Clinical Course User Index [JW] Earleen Newport, MD   Procedures ____________________________________________   LABS (pertinent positives/negatives)  Labs Reviewed  CBC WITH DIFFERENTIAL/PLATELET - Abnormal; Notable for the following components:      Result Value   Neutro Abs 9.1 (*)    Lymphs Abs 0.5 (*)    All other components within normal limits  COMPREHENSIVE METABOLIC PANEL - Abnormal; Notable for the following components:   Glucose, Bld 108 (*)    Creatinine, Ser 1.32 (*)    GFR calc non Af Amer 42 (*)    GFR calc Af Amer 48 (*)    All other components within normal limits  URINALYSIS, COMPLETE (UACMP) WITH MICROSCOPIC - Abnormal; Notable for the following components:   Color, Urine YELLOW (*)    APPearance HAZY (*)    Protein, ur 30 (*)    Nitrite POSITIVE (*)    Leukocytes, UA LARGE (*)    WBC, UA >50 (*)    Bacteria, UA RARE (*)    All other components within normal limits  URINE CULTURE  TROPONIN I  INFLUENZA PANEL BY PCR (TYPE A & B)   CBG MONITORING, ED    RADIOLOGY Images were viewed by me  Chest x-ray  IMPRESSION: No edema or consolidation. Stable cardiac silhouette. ____________________________________________   DIFFERENTIAL DIAGNOSIS   Dehydration, electrolyte abnormality, multiple sclerosis, general debility, influenza, pneumonia  FINAL ASSESSMENT AND PLAN  Weakness, depression, UTI   Plan: The patient had presented for worsening weakness with recent falls and flulike symptoms. Patient's labs were unremarkable with exception of her urine.  We have sent a urine culture and start IV Rocephin as dictated above.  Patient's imaging was negative for any acute process.  She is pending social work disposition.  I do not think she meets criteria for hospital admission as most of these findings today are chronic.   Laurence Aly, MD    Note: This note was generated in part or whole with voice recognition software. Voice recognition is usually quite accurate but there are transcription errors that can and very often do occur. I apologize for any typographical errors that were not detected and corrected.     Earleen Newport, MD 05/13/18 1020

## 2018-05-14 LAB — URINE CULTURE

## 2018-05-14 MED ORDER — ACETAMINOPHEN 325 MG PO TABS
650.0000 mg | ORAL_TABLET | Freq: Once | ORAL | Status: AC
  Administered 2018-05-14: 650 mg via ORAL
  Filled 2018-05-14: qty 2

## 2018-05-14 NOTE — ED Notes (Signed)
Pt given meal tray and a coke per her request.

## 2018-05-14 NOTE — Clinical Social Work Note (Addendum)
In addition to [facility], [facility] therapeutic (SNF) & [facility] have also declined.   CSW received voicemail from [employer] at [facility]. CSW called back [phone]) and spoke with Magda Paganini, who wants to know if patient is receiving treatment for her lymphoma. CSW staffed with EDP Dr. Corky Downs, who confirmed patient not receiving treatment for lymphoma. CSW updated Magda Paganini, who stated she can make a bed offer and will request insurance authorization.   UPDATE: CSW faxed FL-2, H&P, psych note, and 30 day signed note to [facility] for PASRR number. CSW updated patient. Patient asked CSW to update live-in friend [name] and ask that they bring her some clothes. CSW called friend [name] [phone]) and provided update. [name] appreciative of [place] communication and stated she would update patient's spouse when he wakes up. [name] also stated husband would bring up clothes this afternoon. CSW awaiting insurance auth decision. CSW continuing to follow for transition of care.   Oretha Ellis, Latanya Presser, [place] Worker-Emergency Department [phone]

## 2018-05-14 NOTE — ED Notes (Signed)
Pt given a warm blanket. Pt does not need present any needs at this time. NAD noted. VSS. This RN will continue to monitor patient.

## 2018-05-14 NOTE — ED Notes (Signed)
Pt continues to rest in bed. Pt requesting meal tray, pt given meal tray per her request. Lights turned on. Pt states she does not know what is going on with her placement and that the CSW "didn't talk to her for long". This RN will call CSW for update. Pt states she is unable to get in touch with husband or friend.

## 2018-05-14 NOTE — ED Notes (Signed)
CSW at bedside at this time.  

## 2018-05-14 NOTE — Clinical Social Work Note (Addendum)
UPDATE 1:19pm: CSW read previous nurses note stating patient "does not know what is going on with her placement." CSW spoke with [facility] and provided update. CSW met with patient at bedside. CSW asked patient what additional information she needs. Patient stated knows she is "waiting on insurance" but does not know what facility she is going to. CSW went over status again: [facility] made a bed offer and has requested insurance authorization. CSW explained we are awaiting a decision from insurance as to if they will approve or deny a short-term rehab stay at [facility]. CSW reiterated that if insurance denies short-term rehab patient will have to discharge homes and alternative plans for care. Patient expressed understanding. CSW also informed patient that [name] (live-in friend) has been informed of the above update and that patient's husband to bring clothes this afternoon. CSW offered to get phone for patient to call family. Patient declined. CSW engaged in [place] with patient. Patient able to tell CSW what facility she is going to and the barrier to discharge [employer] authorization). CSW also wrote name of facility on the whiteboard. CSW updated EDRN Jinny Blossom of conversation.   UPDATE 5:06pm - Patient approved for a 30-day PASRR# [id number] E. CSW spoke with Magda Paganini in admissions at [facility] who stated they are still waiting on insurance authorization. CSW updated patient at bedside, while EDRN Megan present. CSW continuing to follow for discharge needs and remains available for consult.   Oretha Ellis, Latanya Presser, [place] Worker-Emergency Department [phone]   PREVIOUS NOTE 05/14/2018 9:22am: In addition to [facility], [facility], therapeutic (SNF) & [facility] SNF have also declined.   CSW received voicemail from [employer] at [facility]. CSW called back [phone]) and spoke with Magda Paganini, who wants to know if patient is receiving  treatment for her lymphoma. CSW staffed with EDP Dr. Corky Downs, who confirmed patient not receiving treatment for lymphoma. CSW updated Magda Paganini, who stated she can make a bed offer and will request insurance authorization.   UPDATE: CSW faxed FL-2, H&P, psych note, and 30 day signed note to [facility] for PASRR number. CSW updated patient. Patient asked CSW to update live-in friend [name] and ask that they bring her some clothes. CSW called friend [name] [phone]) and provided update. [name] appreciative of [place] communication and stated she would update patient's spouse when he wakes up. [name] also stated husband would bring up clothes this afternoon. CSW awaiting insurance auth decision. CSW continuing to follow for transition of care.   Oretha Ellis, Latanya Presser, [place] Worker-Emergency Department [phone]

## 2018-05-14 NOTE — ED Notes (Signed)
Patient resting in bed watching TV

## 2018-05-14 NOTE — ED Notes (Signed)
Pt resting in bed with NAD noted, pt resting with lights dimmed. Pt requested a phone to order food, this RN explained to patient that she would have a lunch tray automatically delivered to her. Pt no longer needed the phone. Pt states, "am I going to have to find my own place to go from here?" Informed patient will have CSW come and speak with her. Pt states understanding at this time.

## 2018-05-14 NOTE — ED Notes (Signed)
Pt ate most of meal tray. VSS obtained.

## 2018-05-14 NOTE — ED Provider Notes (Signed)
-----------------------------------------   2:47 AM on 05/14/2018 -----------------------------------------   Blood pressure (!) 125/50, pulse 66, temperature 99.1 F (37.3 C), temperature source Oral, resp. rate 18, height 5' (1.524 m), weight 127 kg, SpO2 98 %.  The patient is calm and cooperative at this time.  There have been no acute events since the last update.  Awaiting disposition plan from PT/SW.   Darel Hong, MD 05/14/18 [phone]

## 2018-05-15 MED ORDER — NYSTATIN 100000 UNIT/GM EX POWD: Freq: Two times a day (BID) | CUTANEOUS | Status: DC

## 2018-05-15 MED ORDER — CIPROFLOXACIN HCL 500 MG PO TABS: 500.0000 mg | ORAL_TABLET | Freq: Two times a day (BID) | ORAL | 0 refills | Status: AC

## 2018-05-15 NOTE — Progress Notes (Addendum)
LCSW just received auth number approval from [facility].  Patient will be transported by family going to room 501- Call report number [phone]  LCSW consulted EDP and EDRN and requested scripts and paper work and mentioned that family will transport her to [facility]. Notified patient best friend and husband  Enis Slipper LCSW [phone]

## 2018-05-15 NOTE — Progress Notes (Signed)
LCSW received a call from [place] at [facility]- requesting the medicare number for this patient. LCSW provided this number.  LCSW requested this information from registration and texted it over to [place]. In meeting with the patient she reports she is waiting forever to go to [facility] and would rather go home. She has been in ED for 2 days. LCSW will consult with [facility].  [employer] LCSW [phone]

## 2018-05-15 NOTE — Consult Note (Signed)
[facility] Nurse wound consult note Reason for Consult: Unna Boot on LLE, full thickness wound on RLE at knee Wound type:Trauma, venous insufficiency Pressure Injury POA: NA Measurement: RLE:  1.5cm x 1cm x 0.2cm with pink, moist wound bed and scant serous drainage. Wound bed:As described above Drainage (amount, consistency, odor) As described above Periwound: intact Dressing procedure/placement/frequency: Orders provided for Nursing for silicone foam dressing to RLE.  The [employer] is removed from the LLE and there is no evidence of wounds in this location.  I will discontinue the Unna's boot in favor of compression via an ACE bandage.  Topical therapy is ordered for the tinea pedis on the left foot, we will treat bilaterally.   [facility] nursing team will not follow, but will remain available to this patient, the nursing and medical teams.  Please re-consult if needed. Thanks, Maudie Flakes, MSN, RN, [place], Arther Abbott  Pager# [phone]

## 2018-05-15 NOTE — ED Provider Notes (Addendum)
Patient has been accepted to [facility].  Will be discharged at this time.  Patient aware of plan.  No further complaints at this time.  Resting comfortably.   Orbie Pyo, MD 05/15/18 1521    Clearnce Hasten Randall An, MD 05/15/18 [phone]

## 2018-05-15 NOTE — ED Notes (Signed)
Report received, care of pt assumed.  Pt resting quietly in bed with eyes closed, resp even and nonlabored, will monitor.

## 2018-05-15 NOTE — ED Notes (Signed)
PT resting quietly in bed, NAD noted.  Pt expresses to this RN that she is ready to leave here, "I would just go home at this point".  Pt encouraged that insurance approval should be coming soon, hopefully today.  Pt encouraged to to hand on a little longer.

## 2018-05-15 NOTE — ED Notes (Signed)
PT continues to sleep in hospital bed at this time.  Resp even and non labored, skin warm and dry.  Will hold medications and VS until pt is awake.  Will continue to monitor.

## 2018-05-15 NOTE — Progress Notes (Signed)
LCSW consulted with director of admissions Magda Paganini at [facility]) and she is aware the patient will be arriving in a couple of hours by her husband.  [employer] LCSW [phone]

## 2018-05-15 NOTE — Progress Notes (Signed)
LCSW consulted via text message awaitng for authorization as per [place] LCSW [phone]

## 2018-05-15 NOTE — NC FL2 (Signed)
[place] LEVEL OF CARE SCREENING TOOL     IDENTIFICATION  Patient Name: [name] Birthdate: Sep 19, 1950 Sex: female Admission Date (Current Location): 05/13/2018  [place] and [place] Number:  Engineering geologist and Address:  [facility], [address], [place], [place] [address]      Provider Number: [id number]  Attending Physician Name and Address:  No att. providers found  Relative Name and Phone Number:  Spouse Zailyn Thoennes [phone]    Current Level of Care: Hospital Recommended Level of Care: [place] Prior Approval Number:    Date Approved/Denied:   PASRR Number:   [id number] E.  Discharge Plan: SNF    Current Diagnoses: Patient Active Problem List   Diagnosis Date Noted  . Fall 05/13/2018  . Depression 05/13/2018  . Recurrent cellulitis of lower extremity 06/25/2017  . Medication monitoring encounter 06/05/2017  . Cellulitis of left lower leg 04/25/2017  . Adjustment disorder with mixed disturbance of emotions and conduct 04/23/2017  . Foot pain, bilateral 02/03/2017  . Tinea pedis 02/03/2017  . Left ovarian cyst 12/09/2016  . Abdominal aortic atherosclerosis ([place]) 11/11/2016  . Swelling of lower extremity 10/11/2016  . Obstructive sleep apnea 10/11/2016  . Follicular lymphoma of intra-abdominal lymph nodes ([place]) 08/06/2016  . Multiple falls 06/07/2016  . Inguinal adenopathy 05/29/2016  . Obesity, morbid ([place]) 01/05/2016  . Low HDL (under 40) 12/19/2015  . Skin ulcer ([place]) 11/08/2015  . Peripheral vascular disease of lower extremity with ulceration ([place]) 11/08/2015  . CKD (chronic kidney disease) stage 3, GFR 30-59 ml/min (HCC) 11/08/2015  . Constipation due to pain medication 01/31/2015  . Obstructive sleep apnea of adult 01/13/2015  . Pelvic muscle wasting 01/13/2015  . Incomplete bladder emptying 01/13/2015  . Headache, migraine 10/24/2014  . 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 Current  tobacco use 10/24/2014  . Lumbar radiculopathy, chronic 10/02/2013  . COPD with bronchial hyperresponsiveness ([place]) 10/02/2013  . Major depressive disorder, recurrent, in partial remission ([place]) 10/02/2013  . Hypertension goal BP (blood pressure) < 140/90 10/02/2013  . Multiple sclerosis ([place]) 10/02/2013  . Absence of bladder continence 09/25/2012  . Acontractile bladder 02/12/2012  . Narrowing of intervertebral disc space 11/26/2011  . Kyphoscoliosis and scoliosis 11/26/2011    Orientation RESPIRATION BLADDER Height & Weight     Self, Time, Situation, Place  Normal Indwelling catheter Weight: 280 lb (127 kg) Height:  5' (152.4 cm)  BEHAVIORAL SYMPTOMS/MOOD NEUROLOGICAL BOWEL NUTRITION STATUS      Continent Diet(Normal Diet)  AMBULATORY STATUS COMMUNICATION OF NEEDS Skin   Extensive Assist Verbally Normal                       Personal Care Assistance Level of Assistance  Bathing, Feeding, Dressing Bathing Assistance: Maximum assistance Feeding assistance: Limited assistance Dressing Assistance: Maximum assistance     Functional Limitations Info  Sight, Hearing, Speech Sight Info: Adequate Hearing Info: Adequate Speech Info: Adequate    SPECIAL CARE FACTORS FREQUENCY  PT (By licensed PT)     PT Frequency: 5x              Contractures Contractures Info: Not present    Additional Factors Info  Psychotropic Code Status Info: Full Allergies Info: No Known Allergies Psychotropic Info: See med list         Current Medications (05/15/2018):  This is the current hospital active medication list Current Facility-Administered Medications  Medication Dose Route Frequency Provider Last Rate Last  Dose  . ciprofloxacin (CIPRO) tablet 500 mg  500 mg Oral BID Earleen Newport, MD   500 mg at 05/15/18 0912  . clonazePAM (KLONOPIN) tablet 1 mg  1 mg Oral QHS Merlyn Lot, MD   1 mg at 05/14/18 2125  . QUEtiapine (SEROQUEL XR) 24 hr tablet 150 mg  150 mg Oral  QHS Merlyn Lot, MD   150 mg at 05/14/18 2125   Current Outpatient Medications  Medication Sig Dispense Refill  . atorvastatin (LIPITOR) 10 MG tablet Take 1 tablet (10 mg total) by mouth at bedtime. 30 tablet 1  . BIOTIN PO Take by mouth daily. Pt taking 500 mcg daily    . buPROPion (WELLBUTRIN XL) 300 MG 24 hr tablet Take 300 mg by mouth every morning.     . clonazePAM (KLONOPIN) 1 MG tablet Take 1 tablet (1 mg total) by mouth at bedtime. 5 tablet 0  . DULoxetine (CYMBALTA) 60 MG capsule Take 1 capsule (60 mg total) by mouth every morning. 30 capsule 3  . furosemide (LASIX) 20 MG tablet Take 1 tablet (20 mg total) by mouth daily. STOP HCTZ 30 tablet 0  . gabapentin (NEURONTIN) 600 MG tablet Take 1 tablet (600 mg total) by mouth 3 (three) times daily.    . hydrALAZINE (APRESOLINE) 50 MG tablet Take 1 tablet (50 mg total) by mouth 3 (three) times daily. (Patient taking differently: Take 50 mg by mouth 2 (two) times daily. ) 90 tablet 5  . interferon beta-1a (AVONEX) 30 MCG/0.5ML PSKT injection Inject 30 mcg into the muscle every Monday.    . methylphenidate (RITALIN) 10 MG tablet Take 1 tablet (10 mg total) by mouth 2 (two) times daily.    . Multiple Vitamin (MULTIVITAMIN WITH MINERALS) TABS tablet Take 1 tablet by mouth every evening.     . polyethylene glycol powder (GLYCOLAX/MIRALAX) powder Take 17 g by mouth daily as needed for mild constipation. Mixed in water (Patient taking differently: Take 17 g by mouth daily as needed for mild constipation. For constipation)    . potassium chloride (KLOR-CON 10) 10 MEQ tablet Take 1 tablet (10 mEq total) by mouth daily. 30 tablet 0  . QUEtiapine Fumarate (SEROQUEL XR) 150 MG 24 hr tablet Take 150 mg by mouth at bedtime.   4  . vitamin C (ASCORBIC ACID) 500 MG tablet Take 500 mg by mouth every evening.      Facility-Administered Medications Ordered in Other Encounters  Medication Dose Route Frequency Provider Last Rate Last Dose  . heparin lock  flush 100 unit/mL  500 Units Intracatheter Once PRN Sindy Guadeloupe, MD         Discharge Medications: Please see discharge summary for a list of discharge medications.  Relevant Imaging Results:  Relevant Lab Results:   Additional Information SSN: [id number]  Joana Reamer, [place]

## 2018-05-28 ENCOUNTER — Ambulatory Visit: Payer: Medicare Other | Admitting: Urology

## 2018-06-11 ENCOUNTER — Emergency Department: Payer: Medicare Other

## 2018-06-11 ENCOUNTER — Observation Stay
Admission: EM | Admit: 2018-06-11 | Discharge: 2018-06-14 | Disposition: A | Discharge status: Home / Self Care | Payer: Medicare Other | Attending: Internal Medicine | Admitting: Internal Medicine

## 2018-06-11 ENCOUNTER — Other Ambulatory Visit: Payer: Self-pay

## 2018-06-11 ENCOUNTER — Encounter: Payer: Self-pay | Admitting: Emergency Medicine

## 2018-06-11 DIAGNOSIS — B952 Enterococcus as the cause of diseases classified elsewhere: Secondary | ICD-10-CM | POA: Insufficient documentation

## 2018-06-11 DIAGNOSIS — G319 Degenerative disease of nervous system, unspecified: Secondary | ICD-10-CM | POA: Diagnosis not present

## 2018-06-11 DIAGNOSIS — I739 Peripheral vascular disease, unspecified: Secondary | ICD-10-CM | POA: Diagnosis not present

## 2018-06-11 DIAGNOSIS — Z23 Encounter for immunization: Secondary | ICD-10-CM | POA: Diagnosis not present

## 2018-06-11 DIAGNOSIS — R531 Weakness: Secondary | ICD-10-CM

## 2018-06-11 DIAGNOSIS — J449 Chronic obstructive pulmonary disease, unspecified: Secondary | ICD-10-CM | POA: Insufficient documentation

## 2018-06-11 DIAGNOSIS — B9689 Other specified bacterial agents as the cause of diseases classified elsewhere: Secondary | ICD-10-CM | POA: Insufficient documentation

## 2018-06-11 DIAGNOSIS — R410 Disorientation, unspecified: Secondary | ICD-10-CM | POA: Insufficient documentation

## 2018-06-11 DIAGNOSIS — N390 Urinary tract infection, site not specified: Principal | ICD-10-CM | POA: Diagnosis present

## 2018-06-11 DIAGNOSIS — Z8249 Family history of ischemic heart disease and other diseases of the circulatory system: Secondary | ICD-10-CM | POA: Diagnosis not present

## 2018-06-11 DIAGNOSIS — W182XXA Fall in (into) shower or empty bathtub, initial encounter: Secondary | ICD-10-CM | POA: Insufficient documentation

## 2018-06-11 DIAGNOSIS — I493 Ventricular premature depolarization: Secondary | ICD-10-CM | POA: Insufficient documentation

## 2018-06-11 DIAGNOSIS — M6281 Muscle weakness (generalized): Secondary | ICD-10-CM | POA: Insufficient documentation

## 2018-06-11 DIAGNOSIS — G35 Multiple sclerosis: Secondary | ICD-10-CM | POA: Insufficient documentation

## 2018-06-11 DIAGNOSIS — R296 Repeated falls: Secondary | ICD-10-CM | POA: Diagnosis not present

## 2018-06-11 DIAGNOSIS — I700 Atherosclerosis of aorta: Secondary | ICD-10-CM | POA: Insufficient documentation

## 2018-06-11 DIAGNOSIS — Z8572 Personal history of non-Hodgkin lymphomas: Secondary | ICD-10-CM | POA: Insufficient documentation

## 2018-06-11 DIAGNOSIS — Z6841 Body Mass Index (BMI) 40.0 and over, adult: Secondary | ICD-10-CM | POA: Insufficient documentation

## 2018-06-11 DIAGNOSIS — I4510 Unspecified right bundle-branch block: Secondary | ICD-10-CM | POA: Insufficient documentation

## 2018-06-11 DIAGNOSIS — Z79899 Other long term (current) drug therapy: Secondary | ICD-10-CM | POA: Insufficient documentation

## 2018-06-11 DIAGNOSIS — L03116 Cellulitis of left lower limb: Secondary | ICD-10-CM | POA: Diagnosis not present

## 2018-06-11 DIAGNOSIS — I445 Left posterior fascicular block: Secondary | ICD-10-CM | POA: Insufficient documentation

## 2018-06-11 DIAGNOSIS — Z87891 Personal history of nicotine dependence: Secondary | ICD-10-CM | POA: Insufficient documentation

## 2018-06-11 DIAGNOSIS — N319 Neuromuscular dysfunction of bladder, unspecified: Secondary | ICD-10-CM | POA: Insufficient documentation

## 2018-06-11 DIAGNOSIS — Y93E1 Activity, personal bathing and showering: Secondary | ICD-10-CM | POA: Diagnosis not present

## 2018-06-11 DIAGNOSIS — R4182 Altered mental status, unspecified: Secondary | ICD-10-CM | POA: Diagnosis present

## 2018-06-11 DIAGNOSIS — W19XXXA Unspecified fall, initial encounter: Secondary | ICD-10-CM

## 2018-06-11 LAB — CBC WITH DIFFERENTIAL/PLATELET
Abs Immature Granulocytes: 0.03 10*3/uL (ref 0.00–0.07)
Basophils Absolute: 0 10*3/uL (ref 0.0–0.1)
Basophils Relative: 1 %
Eosinophils Absolute: 0.3 10*3/uL (ref 0.0–0.5)
Eosinophils Relative: 6 %
HCT: 36 % (ref 36.0–46.0)
Hemoglobin: 11.4 g/dL — ABNORMAL LOW (ref 12.0–15.0)
Immature Granulocytes: 1 %
Lymphocytes Relative: 9 %
Lymphs Abs: 0.5 10*3/uL — ABNORMAL LOW (ref 0.7–4.0)
MCH: 28.6 pg (ref 26.0–34.0)
MCHC: 31.7 g/dL (ref 30.0–36.0)
MCV: 90.5 fL (ref 80.0–100.0)
Monocytes Absolute: 0.6 10*3/uL (ref 0.1–1.0)
Monocytes Relative: 10 %
Neutro Abs: 4.3 10*3/uL (ref 1.7–7.7)
Neutrophils Relative %: 73 %
Platelets: 220 10*3/uL (ref 150–400)
RBC: 3.98 MIL/uL (ref 3.87–5.11)
RDW: 13.2 % (ref 11.5–15.5)
WBC: 5.8 10*3/uL (ref 4.0–10.5)
nRBC: 0 % (ref 0.0–0.2)

## 2018-06-11 LAB — URINALYSIS, COMPLETE (UACMP) WITH MICROSCOPIC
Bilirubin Urine: NEGATIVE
Glucose, UA: NEGATIVE mg/dL
Ketones, ur: NEGATIVE mg/dL
Nitrite: POSITIVE — AB
Protein, ur: 30 mg/dL — AB
Specific Gravity, Urine: 1.019 (ref 1.005–1.030)
pH: 5 (ref 5.0–8.0)

## 2018-06-11 LAB — BASIC METABOLIC PANEL
Anion gap: 7 (ref 5–15)
BUN: 16 mg/dL (ref 8–23)
CO2: 27 mmol/L (ref 22–32)
Calcium: 8.9 mg/dL (ref 8.9–10.3)
Chloride: 102 mmol/L (ref 98–111)
Creatinine, Ser: 1.16 mg/dL — ABNORMAL HIGH (ref 0.44–1.00)
GFR calc Af Amer: 56 mL/min — ABNORMAL LOW (ref >60)
GFR calc non Af Amer: 49 mL/min — ABNORMAL LOW (ref >60)
Glucose, Bld: 76 mg/dL (ref 70–99)
Potassium: 3.6 mmol/L (ref 3.5–5.1)
Sodium: 136 mmol/L (ref 135–145)

## 2018-06-11 LAB — TROPONIN I: Troponin I: <0.03 ng/mL (ref <0.03)

## 2018-06-11 MED ORDER — VITAMIN C 500 MG PO TABS
500.0000 mg | ORAL_TABLET | Freq: Every evening | ORAL | Status: DC
  Administered 2018-06-12 – 2018-06-13 (×2): 500 mg via ORAL
  Filled 2018-06-11 (×2): qty 1

## 2018-06-11 MED ORDER — ADULT MULTIVITAMIN W/MINERALS CH
1.0000 | ORAL_TABLET | Freq: Every evening | ORAL | Status: DC
  Administered 2018-06-12 – 2018-06-13 (×2): 1 via ORAL
  Filled 2018-06-11 (×2): qty 1

## 2018-06-11 MED ORDER — GABAPENTIN 600 MG PO TABS
600.0000 mg | ORAL_TABLET | Freq: Three times a day (TID) | ORAL | Status: DC
  Administered 2018-06-11 – 2018-06-14 (×8): 600 mg via ORAL
  Filled 2018-06-11 (×8): qty 1

## 2018-06-11 MED ORDER — ATORVASTATIN CALCIUM 20 MG PO TABS
10.0000 mg | ORAL_TABLET | Freq: Every day | ORAL | Status: DC
  Administered 2018-06-11 – 2018-06-13 (×3): 10 mg via ORAL
  Filled 2018-06-11 (×4): qty 1

## 2018-06-11 MED ORDER — SODIUM CHLORIDE 0.9 % IV SOLN
1.0000 g | INTRAVENOUS | Status: DC
  Administered 2018-06-12 – 2018-06-13 (×2): 1 g via INTRAVENOUS
  Filled 2018-06-11 (×2): qty 1
  Filled 2018-06-11: qty 10

## 2018-06-11 MED ORDER — QUETIAPINE FUMARATE ER 50 MG PO TB24
150.0000 mg | ORAL_TABLET | Freq: Every day | ORAL | Status: DC
  Administered 2018-06-11 – 2018-06-13 (×3): 150 mg via ORAL
  Filled 2018-06-11 (×4): qty 3

## 2018-06-11 MED ORDER — ONDANSETRON HCL 4 MG/2ML IJ SOLN: 4.0000 mg | Freq: Four times a day (QID) | INTRAMUSCULAR | Status: DC | PRN

## 2018-06-11 MED ORDER — ACETAMINOPHEN 325 MG PO TABS
650.0000 mg | ORAL_TABLET | Freq: Four times a day (QID) | ORAL | Status: DC | PRN
  Administered 2018-06-14: 650 mg via ORAL
  Filled 2018-06-11: qty 2

## 2018-06-11 MED ORDER — POLYETHYLENE GLYCOL 3350 17 GM/SCOOP PO POWD: 17.0000 g | Freq: Every day | ORAL | Status: DC | PRN

## 2018-06-11 MED ORDER — ENOXAPARIN SODIUM 40 MG/0.4ML ~~LOC~~ SOLN
40.0000 mg | Freq: Two times a day (BID) | SUBCUTANEOUS | Status: DC
  Administered 2018-06-11 – 2018-06-14 (×6): 40 mg via SUBCUTANEOUS
  Filled 2018-06-11 (×6): qty 0.4

## 2018-06-11 MED ORDER — SODIUM CHLORIDE 0.9 % IV SOLN
INTRAVENOUS | Status: DC
  Administered 2018-06-12: 02:00:00 via INTRAVENOUS

## 2018-06-11 MED ORDER — METHYLPHENIDATE HCL 5 MG PO TABS
10.0000 mg | ORAL_TABLET | Freq: Two times a day (BID) | ORAL | Status: DC
  Administered 2018-06-12 – 2018-06-14 (×5): 10 mg via ORAL
  Filled 2018-06-11 (×6): qty 2

## 2018-06-11 MED ORDER — BUPROPION HCL ER (XL) 150 MG PO TB24
300.0000 mg | ORAL_TABLET | ORAL | Status: DC
  Administered 2018-06-12 – 2018-06-14 (×3): 300 mg via ORAL
  Filled 2018-06-11 (×3): qty 2

## 2018-06-11 MED ORDER — ONDANSETRON HCL 4 MG PO TABS: 4.0000 mg | ORAL_TABLET | Freq: Four times a day (QID) | ORAL | Status: DC | PRN

## 2018-06-11 MED ORDER — POLYETHYLENE GLYCOL 3350 17 G PO PACK: 17.0000 g | PACK | Freq: Every day | ORAL | Status: DC | PRN

## 2018-06-11 MED ORDER — PNEUMOCOCCAL VAC POLYVALENT 25 MCG/0.5ML IJ INJ
0.5000 mL | INJECTION | INTRAMUSCULAR | Status: AC
  Administered 2018-06-12: 0.5 mL via INTRAMUSCULAR
  Filled 2018-06-11: qty 0.5

## 2018-06-11 MED ORDER — CLONAZEPAM 0.5 MG PO TABS
1.0000 mg | ORAL_TABLET | Freq: Every day | ORAL | Status: DC
  Administered 2018-06-11: 23:00:00 1 mg via ORAL
  Filled 2018-06-11: qty 2

## 2018-06-11 MED ORDER — ACETAMINOPHEN 650 MG RE SUPP: 650.0000 mg | Freq: Four times a day (QID) | RECTAL | Status: DC | PRN

## 2018-06-11 MED ORDER — DULOXETINE HCL 30 MG PO CPEP
60.0000 mg | ORAL_CAPSULE | ORAL | Status: DC
  Administered 2018-06-12 – 2018-06-14 (×3): 60 mg via ORAL
  Filled 2018-06-11 (×3): qty 2

## 2018-06-11 NOTE — ED Notes (Signed)
Pt recently treated for UTI in hospital and D/C to rehab facility. Pt recently D/C from rehab facility. Pt c/o generalized weakness since D/C.

## 2018-06-11 NOTE — H&P (Signed)
[facility] NAME: [name]    MR#:  [id number]  DATE OF BIRTH:  1951-02-06  DATE OF ADMISSION:  06/11/2018  PRIMARY CARE PHYSICIAN: Kirk Ruths, MD   REQUESTING/REFERRING PHYSICIAN:   CHIEF COMPLAINT:   Chief Complaint  Patient presents with  . Fall    HISTORY OF PRESENT ILLNESS: [name]  is a 68 y.o. female with a known history of abdominal aortic atherosclerosis, COPD, chronic left leg edema, B-cell lymphoma, multiple sclerosis, neurogenic bladder with chronic Foley catheter, peripheral vascular disease of lower extremity presented to emergency room for generalized weakness and frequent falls.  She had been at [facility] 1 week ago.  Discharged to home a week ago patient has weakness.  She had 3 falls.  She uses a Rollator at home to ambulate.  Work-up in the emergency room showed urinary tract infection.  Patient started on IV antibiotics.  PAST MEDICAL HISTORY:   Past Medical History:  Diagnosis Date  . Abdominal aortic atherosclerosis ([place]) 11/11/2016  . ADHD   . Anxiety   . COPD (chronic obstructive pulmonary disease) ([place])   . Depression    major depressive  . Dyspnea    doe  . Edema    left leg  . Follicular lymphoma ([place])    B Cell  . Follicular lymphoma grade II ([place])   . Hypertension   . Hypotension    idiopathic  . Kyphoscoliosis and scoliosis 11/26/2011  . Morbid obesity ([place]) 01/05/2016  . Multiple sclerosis ([place])   . Multiple sclerosis ([place])    1980's  . Neuromuscular disorder ([place])   . Obstructive and reflux uropathy    foley  . Pain    atypical facial  . Peripheral vascular disease of lower extremity with ulceration ([place]) 11/08/2015  . Skin ulcer ([place]) 11/08/2015  . Weakness    generalized. has MS    PAST SURGICAL HISTORY:  Past Surgical History:  Procedure Laterality Date  . BACK SURGERY N/A 2002  . CYST EXCISION     lower back  . INGUINAL LYMPH NODE BIOPSY Left  07/04/2016   Procedure: INGUINAL LYMPH NODE BIOPSY;  Surgeon: Christene Lye, MD;  Location: [facility] ORS;  Service: General;  Laterality: Left;  . PORTACATH PLACEMENT N/A 07/22/2016   Procedure: INSERTION PORT-A-CATH;  Surgeon: Christene Lye, MD;  Location: [facility] ORS;  Service: General;  Laterality: N/A;  . TONSILLECTOMY AND ADENOIDECTOMY    . TUBAL LIGATION      SOCIAL HISTORY:  Social History   Tobacco Use  . Smoking status: Former Smoker    Packs/day: 1.00    Years: 20.00    Pack years: 20.00    Types: Cigarettes    Start date: 04/30/1995    Last attempt to quit: 02/03/2016    Years since quitting: 2.3  . Smokeless tobacco: Never Used  Substance Use Topics  . Alcohol use: No    Alcohol/week: 0.0 standard drinks    FAMILY HISTORY:  Family History  Problem Relation Age of Onset  . COPD Mother   . Diabetes Mother   . Heart failure Mother   . Alcohol abuse Father   . Kidney disease Father   . Kidney failure Father   . Arthritis Sister   . CAD Maternal Grandmother   . Stroke Maternal Grandfather   . Arthritis Sister   . Mental illness Sister   . Arthritis Brother     DRUG  ALLERGIES: No Known Allergies  REVIEW OF SYSTEMS:   CONSTITUTIONAL: No fever, has fatigue and weakness.  EYES: No blurred or double vision.  EARS, NOSE, AND THROAT: No tinnitus or ear pain.  RESPIRATORY: No cough, shortness of breath, wheezing or hemoptysis.  CARDIOVASCULAR: No chest pain, orthopnea, edema.  GASTROINTESTINAL: No nausea, vomiting, diarrhea or abdominal pain.  GENITOURINARY: No dysuria, hematuria.  ENDOCRINE: No polyuria, nocturia,  HEMATOLOGY: No anemia, easy bruising or bleeding SKIN: No rash or lesion. MUSCULOSKELETAL: No joint pain or arthritis.   Left foot redness NEUROLOGIC: No tingling, numbness, weakness.  PSYCHIATRY: No anxiety or depression.   MEDICATIONS AT HOME:  Prior to Admission medications   Medication Sig Start Date End Date Taking? Authorizing  Provider  BIOTIN PO Take by mouth daily. Pt taking 500 mcg daily   Yes [provider]  buPROPion (WELLBUTRIN XL) 300 MG 24 hr tablet Take 300 mg by mouth every morning.  05/28/16  Yes [provider]  clonazePAM (KLONOPIN) 1 MG tablet Take 1 tablet (1 mg total) by mouth at bedtime. 01/06/17  Yes Sudini, Alveta Heimlich, MD  DULoxetine (CYMBALTA) 60 MG capsule Take 1 capsule (60 mg total) by mouth every morning. 10/14/16  Yes Vaughan Basta, MD  furosemide (LASIX) 20 MG tablet Take 1 tablet (20 mg total) by mouth daily. STOP HCTZ 02/02/18  Yes Lada, Satira Anis, MD  gabapentin (NEURONTIN) 600 MG tablet Take 1 tablet (600 mg total) by mouth 3 (three) times daily. 02/09/17  Yes Mody, Ulice Bold, MD  hydrALAZINE (APRESOLINE) 50 MG tablet Take 1 tablet (50 mg total) by mouth 3 (three) times daily. Patient taking differently: Take 50 mg by mouth daily.  02/02/18  Yes Lada, Satira Anis, MD  interferon beta-1a (AVONEX) 30 MCG/0.5ML PSKT injection Inject 30 mcg into the muscle every Monday.   Yes [provider]  methylphenidate (RITALIN) 10 MG tablet Take 1 tablet (10 mg total) by mouth 2 (two) times daily. 07/08/17  Yes Bettey Costa, MD  Multiple Vitamin (MULTIVITAMIN WITH MINERALS) TABS tablet Take 1 tablet by mouth every evening.    Yes [provider]  polyethylene glycol powder (GLYCOLAX/MIRALAX) powder Take 17 g by mouth daily as needed for mild constipation. Mixed in water Patient taking differently: Take 17 g by mouth daily as needed for mild constipation. For constipation 01/06/17  Yes Sudini, Alveta Heimlich, MD  potassium chloride (KLOR-CON 10) 10 MEQ tablet Take 1 tablet (10 mEq total) by mouth daily. 02/02/18  Yes Lada, Satira Anis, MD  QUEtiapine Fumarate (SEROQUEL XR) 150 MG 24 hr tablet Take 150 mg by mouth at bedtime.    Yes [provider]  vitamin C (ASCORBIC ACID) 500 MG tablet Take 500 mg by mouth every evening.    Yes [provider]  atorvastatin (LIPITOR) 10  MG tablet Take 1 tablet (10 mg total) by mouth at bedtime. Patient not taking: Reported on 06/11/2018 02/04/18   Arnetha Courser, MD      PHYSICAL EXAMINATION:   VITAL SIGNS: Blood pressure 129/61, pulse (!) 59, temperature 97.9 F (36.6 C), temperature source Oral, resp. rate 19, height 5' (1.524 m), weight 127 kg, SpO2 100 %.  GENERAL:  68 y.o.-year-old patient lying in the bed with no acute distress.  EYES: Pupils equal, round, reactive to light and accommodation. No scleral icterus. Extraocular muscles intact.  HEENT: Head atraumatic, normocephalic. Oropharynx and nasopharynx clear.  NECK:  Supple, no jugular venous distention. No thyroid enlargement, no tenderness.  LUNGS: Normal breath sounds  bilaterally, no wheezing, rales,rhonchi or crepitation. No use of accessory muscles of respiration.  CARDIOVASCULAR: S1, S2 normal. No murmurs, rubs, or gallops.  ABDOMEN: Soft, nontender, nondistended. Bowel sounds present. No organomegaly or mass.  EXTREMITIES: No cyanosis or clubbing.  Chronic left leg edema NEUROLOGIC: Cranial nerves II through XII are intact. Muscle strength 5/5 in all extremities. Sensation intact. Gait not checked.  PSYCHIATRIC: The patient is alert and oriented x 3.  SKIN: Left foot redness  LABORATORY PANEL:   CBC Recent Labs  Lab 06/11/18 1447  WBC 5.8  HGB 11.4*  HCT 36.0  PLT 220  MCV 90.5  MCH 28.6  MCHC 31.7  RDW 13.2  LYMPHSABS 0.5*  MONOABS 0.6  EOSABS 0.3  BASOSABS 0.0   ------------------------------------------------------------------------------------------------------------------  Chemistries  Recent Labs  Lab 06/11/18 1447  NA 136  K 3.6  CL 102  CO2 27  GLUCOSE 76  BUN 16  CREATININE 1.16*  CALCIUM 8.9   ------------------------------------------------------------------------------------------------------------------ estimated creatinine clearance is 58 mL/min (A) (by C-G formula based on SCr of 1.16 mg/dL  (H)). ------------------------------------------------------------------------------------------------------------------ No results for input(s): TSH, T4TOTAL, T3FREE, THYROIDAB in the last 72 hours.  Invalid input(s): FREET3   Coagulation profile No results for input(s): INR, PROTIME in the last 168 hours. ------------------------------------------------------------------------------------------------------------------- No results for input(s): DDIMER in the last 72 hours. -------------------------------------------------------------------------------------------------------------------  Cardiac Enzymes Recent Labs  Lab 06/11/18 1447  TROPONINI <0.03   ------------------------------------------------------------------------------------------------------------------ Invalid input(s): POCBNP  ---------------------------------------------------------------------------------------------------------------  Urinalysis    Component Value Date/Time   COLORURINE YELLOW (A) 06/11/2018 1447   APPEARANCEUR CLOUDY (A) 06/11/2018 1447   APPEARANCEUR Hazy 08/02/2013 0020   LABSPEC 1.019 06/11/2018 1447   LABSPEC 1.012 08/02/2013 0020   PHURINE 5.0 06/11/2018 1447   GLUCOSEU NEGATIVE 06/11/2018 1447   GLUCOSEU Negative 08/02/2013 0020   HGBUR SMALL (A) 06/11/2018 1447   BILIRUBINUR NEGATIVE 06/11/2018 1447   BILIRUBINUR Negative 08/02/2013 0020   KETONESUR NEGATIVE 06/11/2018 1447   PROTEINUR 30 (A) 06/11/2018 1447   NITRITE POSITIVE (A) 06/11/2018 1447   LEUKOCYTESUR MODERATE (A) 06/11/2018 1447   LEUKOCYTESUR 2+ 08/02/2013 0020     RADIOLOGY: Ct Head Wo Contrast  Result Date: 06/11/2018 CLINICAL DATA:  Pt [facility] home via [facility] with c/o fall in shower. Home health nurse was with pt at time of fall. Pt fell trying to sit in shower chair. Multiple falls in past three days. CBG 88. 20G in RAC. Pt denies LOC or hitting head with fall. Alert and oriented. EXAM: CT HEAD WITHOUT CONTRAST  TECHNIQUE: Contiguous axial images were obtained from the base of the skull through the vertex without intravenous contrast. COMPARISON:  04/01/2017 FINDINGS: Brain: There is moderate central and cortical atrophy. Periventricular white matter changes are consistent with small vessel disease. There is no intra or extra-axial fluid collection or mass lesion. The basilar cisterns and ventricles have a normal appearance. There is no CT evidence for acute infarction or hemorrhage. Vascular: There is atherosclerotic calcification of the internal carotid arteries. No hyperdense vessels. Skull: Normal. Negative for fracture or focal lesion. Sinuses/Orbits: Mild mucosal thickening of the paranasal sinuses. Other: None IMPRESSION: 1. No evidence for acute intracranial abnormality. 2. Atrophy and small vessel disease. Electronically Signed   By: Nolon Nations M.D.   On: 06/11/2018 15:17    EKG: Orders placed or performed during the hospital encounter of 06/11/18  . ED EKG  . ED EKG    IMPRESSION AND PLAN: 68 year old female patient with a known history of abdominal aortic atherosclerosis,  COPD, chronic left leg edema, B-cell lymphoma, multiple sclerosis, neurogenic bladder with chronic Foley catheter, peripheral vascular disease of lower extremity presented to emergency room for generalized weakness and frequent falls.   -Acute urinary tract infection Start patient on IV Rocephin antibiotic Prior records were reviewed Urine culture grew Citrobacter and enterococcus but we will wait for the cultures Follow-up cultures  -Altered mental status secondary to UTI Will clear up once treated with antibiotic  -Gait instability and frequent falls Physical therapy evaluation for gait balance and endurance training  -Neurogenic bladder Continue Foley catheter  -Left foot cellulitis Continue IV Rocephin antibiotic  -Chronic left leg edema Clinical monitoring  -History of B-cell lymphoma Supportive  care  -DVT prophylaxis subcu Lovenox daily All the records are reviewed and case discussed with ED provider. Management plans discussed with the patient, family and they are in agreement.  CODE STATUS:Full code Code Status History    Date Active Date Inactive Code Status Order ID Comments User Context   07/07/2017 2000 07/08/2017 1631 Full Code [id number]  Dustin Flock, MD Inpatient   05/14/2017 0152 05/15/2017 1953 Full Code [id number]  Lance Coon, MD Inpatient   04/01/2017 1723 04/05/2017 1742 Full Code [id number]  Daylene Posey, RN Inpatient   02/15/2017 1715 02/17/2017 2008 Full Code [id number]  Henreitta Leber, MD Inpatient   02/06/2017 2356 02/09/2017 1858 Full Code [id number]  Dustin Flock, MD Inpatient   01/02/2017 1903 01/06/2017 1848 Full Code [id number]  [name], MD ED   12/29/2016 1252 12/30/2016 1722 Full Code [id number]  Nicholes Mango, MD Inpatient   10/11/2016 2320 10/14/2016 1726 Full Code [id number]  Theodoro Grist, MD Inpatient   08/28/2016 1159 08/30/2016 1932 Full Code [id number]  Loletha Grayer, MD ED   12/18/2015 1956 12/19/2015 1939 Full Code [id number]  Hower, Aaron Mose, MD ED       TOTAL TIME TAKING CARE OF THIS PATIENT: 52 minutes.    Saundra Shelling M.D on 06/11/2018 at 4:21 PM  Between 7am to 6pm - Pager - [phone]  After 6pm go to [email or web address] - password [email or web address] [facility]  [facility]  Office  [phone]  CC: Primary care physician; Kirk Ruths, MD

## 2018-06-11 NOTE — ED Notes (Signed)
Pt has chronic foley in place due to MS.

## 2018-06-11 NOTE — Progress Notes (Signed)
Advanced care plan. Purpose of the Encounter: CODE STATUS Parties in Attendance: Patient Patient's Decision Capacity: Good Subjective/Patient's story: Presented to the emergency room for weakness and confusion Has frequent falls Objective/Medical story Has urinary tract infection and dehydration Needs IV fluids and antibiotics Goals of care determination:  Advance care directives goals of care and treatment plan discussed Patient and family want everything done which includes CPR, intubation and ventilator if need arises CODE STATUS: Full code Time spent discussing advanced care planning: 16 minutes

## 2018-06-11 NOTE — ED Notes (Signed)
ED TO INPATIENT HANDOFF REPORT  Name/Age/Gender [name] 68 y.o. female  Code Status Code Status History    Date Active Date Inactive Code Status Order ID Comments User Context   07/07/2017 2000 07/08/2017 1631 Full Code [id number]  Dustin Flock, MD Inpatient   05/14/2017 0152 05/15/2017 1953 Full Code [id number]  Lance Coon, MD Inpatient   04/01/2017 1723 04/05/2017 1742 Full Code [id number]  Daylene Posey, RN Inpatient   02/15/2017 1715 02/17/2017 2008 Full Code [id number]  Henreitta Leber, MD Inpatient   02/06/2017 2356 02/09/2017 1858 Full Code [id number]  Dustin Flock, MD Inpatient   01/02/2017 1903 01/06/2017 1848 Full Code [id number]  [name], MD ED   12/29/2016 1252 12/30/2016 1722 Full Code [id number]  Nicholes Mango, MD Inpatient   10/11/2016 2320 10/14/2016 1726 Full Code [id number]  Theodoro Grist, MD Inpatient   08/28/2016 1159 08/30/2016 1932 Full Code [id number]  Loletha Grayer, MD ED   12/18/2015 1956 12/19/2015 1939 Full Code [id number]  Hower, Aaron Mose, MD ED      Home/SNF/Other Home  Chief Complaint ems/fall  Level of Care/Admitting Diagnosis ED Disposition    ED Disposition Condition [facility] Area: [place] [[id number]]  Level of Care: Med-Surg [16]  Diagnosis: Altered mental status [780.97.ICD-9-CM]  Admitting Physician: Saundra Shelling [[id number]]  Attending Physician: Saundra Shelling [[id number]]  Estimated length of stay: past midnight tomorrow  Certification:: I certify this patient will need inpatient services for at least 2 midnights  PT Class (Do Not Modify): Inpatient [101]  PT Acc Code (Do Not Modify): Private [1]       Medical History Past Medical History:  Diagnosis Date  . Abdominal aortic atherosclerosis ([place]) 11/11/2016  . ADHD   . Anxiety   . COPD (chronic obstructive pulmonary disease) ([place])   . Depression    major depressive  . Dyspnea    doe  . Edema    left leg  . Follicular lymphoma ([place])    B Cell   . Follicular lymphoma grade II ([place])   . Hypertension   . Hypotension    idiopathic  . Kyphoscoliosis and scoliosis 11/26/2011  . Morbid obesity ([place]) 01/05/2016  . Multiple sclerosis ([place])   . Multiple sclerosis ([place])    1980's  . Neuromuscular disorder ([place])   . Obstructive and reflux uropathy    foley  . Pain    atypical facial  . Peripheral vascular disease of lower extremity with ulceration ([place]) 11/08/2015  . Skin ulcer ([place]) 11/08/2015  . Weakness    generalized. has MS    Allergies No Known Allergies  IV Location/Drains/Wounds Patient Lines/Drains/Airways Status   Active Line/Drains/Airways    Name:   Placement date:   Placement time:   Site:   Days:   Peripheral IV 06/11/18 Right Antecubital   06/11/18    1412    Antecubital   less than 1   Urethral Catheter ally Latex 16 Fr.   08/28/17    1148    Latex   287          Labs/Imaging Results for orders placed or performed during the hospital encounter of 06/11/18 (from the past 48 hour(s))  CBC with Differential     Status: Abnormal   Collection Time: 06/11/18  2:47 PM  Result Value Ref Range   WBC 5.8 4.0 - 10.5 K/uL   RBC 3.98 3.87 - 5.11 MIL/uL   Hemoglobin 11.4 (L) 12.0 - 15.0 g/dL  HCT 36.0 36.0 - 46.0 %   MCV 90.5 80.0 - 100.0 fL   MCH 28.6 26.0 - 34.0 pg   MCHC 31.7 30.0 - 36.0 g/dL   RDW 13.2 11.5 - 15.5 %   Platelets 220 150 - 400 K/uL   nRBC 0.0 0.0 - 0.2 %   Neutrophils Relative % 73 %   Neutro Abs 4.3 1.7 - 7.7 K/uL   Lymphocytes Relative 9 %   Lymphs Abs 0.5 (L) 0.7 - 4.0 K/uL   Monocytes Relative 10 %   Monocytes Absolute 0.6 0.1 - 1.0 K/uL   Eosinophils Relative 6 %   Eosinophils Absolute 0.3 0.0 - 0.5 K/uL   Basophils Relative 1 %   Basophils Absolute 0.0 0.0 - 0.1 K/uL   Immature Granulocytes 1 %   Abs Immature Granulocytes 0.03 0.00 - 0.07 K/uL    Comment: Performed at [facility], [address] [place], [place] [address]  Basic metabolic panel     Status: Abnormal    Collection Time: 06/11/18  2:47 PM  Result Value Ref Range   Sodium 136 135 - 145 mmol/L   Potassium 3.6 3.5 - 5.1 mmol/L   Chloride 102 98 - 111 mmol/L   CO2 27 22 - 32 mmol/L   Glucose, Bld 76 70 - 99 mg/dL   BUN 16 8 - 23 mg/dL   Creatinine, Ser 1.16 (H) 0.44 - 1.00 mg/dL   Calcium 8.9 8.9 - 10.3 mg/dL   GFR calc non Af Amer 49 (L) >60 mL/min   GFR calc Af Amer 56 (L) >60 mL/min   Anion gap 7 5 - 15    Comment: Performed at [facility], [address] [place], [place] [address]  Urinalysis, Complete w Microscopic     Status: Abnormal   Collection Time: 06/11/18  2:47 PM  Result Value Ref Range   Color, Urine YELLOW (A) YELLOW   APPearance CLOUDY (A) CLEAR   Specific Gravity, Urine 1.019 1.005 - 1.030   pH 5.0 5.0 - 8.0   Glucose, UA NEGATIVE NEGATIVE mg/dL   Hgb urine dipstick SMALL (A) NEGATIVE   Bilirubin Urine NEGATIVE NEGATIVE   Ketones, ur NEGATIVE NEGATIVE mg/dL   Protein, ur 30 (A) NEGATIVE mg/dL   Nitrite POSITIVE (A) NEGATIVE   Leukocytes,Ua MODERATE (A) NEGATIVE   RBC / HPF 6-10 0 - 5 RBC/hpf   WBC, UA 21-50 0 - 5 WBC/hpf   Bacteria, UA RARE (A) NONE SEEN   Squamous Epithelial / LPF 0-5 0 - 5   Mucus PRESENT    Budding Yeast PRESENT    Hyaline Casts, UA PRESENT    Non Squamous Epithelial PRESENT (A) NONE SEEN    Comment: Performed at [facility], [address] [place], [place] [address]  Troponin I - ONCE - STAT     Status: None   Collection Time: 06/11/18  2:47 PM  Result Value Ref Range   Troponin I <0.03 <0.03 ng/mL    Comment: Performed at [facility], [address] [place], [place] [address]   Ct Head Wo Contrast  Result Date: 06/11/2018 CLINICAL DATA:  Pt from home via [facility] with c/o fall in shower. Home health nurse was with pt at time of fall. Pt fell trying to sit in shower chair. Multiple falls in past three days. CBG 88. 20G in RAC. Pt denies LOC or hitting head with fall. Alert and oriented. EXAM: CT HEAD  WITHOUT CONTRAST TECHNIQUE: Contiguous  axial images were obtained from the base of the skull through the vertex without intravenous contrast. COMPARISON:  04/01/2017 FINDINGS: Brain: There is moderate central and cortical atrophy. Periventricular white matter changes are consistent with small vessel disease. There is no intra or extra-axial fluid collection or mass lesion. The basilar cisterns and ventricles have a normal appearance. There is no CT evidence for acute infarction or hemorrhage. Vascular: There is atherosclerotic calcification of the internal carotid arteries. No hyperdense vessels. Skull: Normal. Negative for fracture or focal lesion. Sinuses/Orbits: Mild mucosal thickening of the paranasal sinuses. Other: None IMPRESSION: 1. No evidence for acute intracranial abnormality. 2. Atrophy and small vessel disease. Electronically Signed   By: Nolon Nations M.D.   On: 06/11/2018 15:17    Pending Labs Unresulted Labs (From admission, onward)    Start     Ordered   06/11/18 1525  Urine Culture  Add-on,   AD     06/11/18 1524   Signed and Held  HIV antibody (Routine Testing)  Once,   R     Signed and Held   Signed and Held  CBC  (enoxaparin (LOVENOX)    CrCl >/= 30 ml/min)  Once,   R    Comments:  Baseline for enoxaparin therapy IF NOT ALREADY DRAWN.  Notify MD if PLT < 100 K.    Signed and Held   Signed and Held  Creatinine, serum  (enoxaparin (LOVENOX)    CrCl >/= 30 ml/min)  Once,   R    Comments:  Baseline for enoxaparin therapy IF NOT ALREADY DRAWN.    Signed and Held   Signed and Held  Creatinine, serum  (enoxaparin (LOVENOX)    CrCl >/= 30 ml/min)  Weekly,   R    Comments:  while on enoxaparin therapy    Signed and Held   Signed and Held  Basic metabolic panel  Tomorrow morning,   R     Signed and Held   Signed and Held  CBC  Tomorrow morning,   R     Signed and Held          Vitals/Pain Today's Vitals   06/11/18 1403 06/11/18 1430 06/11/18 1523 06/11/18 1530  BP:  124/86 116/61 126/65 129/61  Pulse: (!) 58 (!) 57 60 (!) 59  Resp: 17 16 14 19   Temp:      TempSrc:      SpO2: 100% 100% 99% 100%  Weight:      Height:      PainSc:        Isolation Precautions No active isolations  Medications Medications  cefTRIAXone (ROCEPHIN) 1 g in sodium chloride 0.9 % 100 mL IVPB (has no administration in time range)    Mobility non-ambulatory

## 2018-06-11 NOTE — Progress Notes (Signed)
Anticoagulation monitoring(Lovenox):  68 yo female ordered Lovenox 40 mg Q24h  Filed Weights   06/11/18 1402  Weight: 280 lb (127 kg)   BMI 54.7    Lab Results  Component Value Date   CREATININE 1.16 (H) 06/11/2018   CREATININE 1.32 (H) 05/13/2018   CREATININE 1.26 (H) 05/08/2018   Estimated Creatinine Clearance: 58 mL/min (A) (by C-G formula based on SCr of 1.16 mg/dL (H)). Hemoglobin & Hematocrit     Component Value Date/Time   HGB 11.4 (L) 06/11/2018 1447   HGB 13.6 08/01/2013 2312   HCT 36.0 06/11/2018 1447   HCT 40.2 08/01/2013 2312     Per Protocol for Patient with estCrcl > 30 ml/min and BMI > 40, will transition to Lovenox 40 mg Q12h.

## 2018-06-11 NOTE — ED Triage Notes (Signed)
Pt from home via [facility] with c/o fall in shower. Home health nurse was with pt at time of fall. Pt fell trying to sit in shower chair. Multiple falls in past three days. CBG 88. 20G in RAC. Pt denies LOC or hitting head with fall. Pt alert and oriented x4 at this time.

## 2018-06-11 NOTE — ED Notes (Signed)
Patient transported to CT 

## 2018-06-11 NOTE — ED Provider Notes (Signed)
[facility] Emergency Department Provider Note  ____________________________________________   I have reviewed the triage vital signs and the nursing notes.   HISTORY  Chief Complaint Weakness  History limited by: Not Limited   HPI [name] is a 68 y.o. female who presents to the emergency department today because of concerns for weakness.  Patient states that bilateral legs have been very weak.  She is having a hard time ambulating.  The patient has had a couple of falls in the past 2 days.  Caregiver is concerned that she might of hit her head because she is also noticed some increased confusion symptoms all started after recent admission.  No tract infection.  Patient is still complaining of some burning with urination and bad odor to her urine.  No fevers.  Per medical record review patient has a history of COPD, depression, recent admission for UTI.  Past Medical History:  Diagnosis Date  . Abdominal aortic atherosclerosis ([place]) 11/11/2016  . ADHD   . Anxiety   . COPD (chronic obstructive pulmonary disease) ([place])   . Depression    major depressive  . Dyspnea    doe  . Edema    left leg  . Follicular lymphoma ([place])    B Cell  . Follicular lymphoma grade II ([place])   . Hypertension   . Hypotension    idiopathic  . Kyphoscoliosis and scoliosis 11/26/2011  . Morbid obesity ([place]) 01/05/2016  . Multiple sclerosis ([place])   . Multiple sclerosis ([place])    1980's  . Neuromuscular disorder ([place])   . Obstructive and reflux uropathy    foley  . Pain    atypical facial  . Peripheral vascular disease of lower extremity with ulceration ([place]) 11/08/2015  . Skin ulcer ([place]) 11/08/2015  . Weakness    generalized. has MS    Patient Active Problem List   Diagnosis Date Noted  . Fall 05/13/2018  . Depression 05/13/2018  . Recurrent cellulitis of lower extremity 06/25/2017  . Medication monitoring encounter 06/05/2017  . Cellulitis of left lower leg  04/25/2017  . Adjustment disorder with mixed disturbance of emotions and conduct 04/23/2017  . Foot pain, bilateral 02/03/2017  . Tinea pedis 02/03/2017  . Left ovarian cyst 12/09/2016  . Abdominal aortic atherosclerosis ([place]) 11/11/2016  . Swelling of lower extremity 10/11/2016  . Obstructive sleep apnea 10/11/2016  . Follicular lymphoma of intra-abdominal lymph nodes ([place]) 08/06/2016  . Multiple falls 06/07/2016  . Inguinal adenopathy 05/29/2016  . Obesity, morbid ([place]) 01/05/2016  . Low HDL (under 40) 12/19/2015  . Skin ulcer ([place]) 11/08/2015  . Peripheral vascular disease of lower extremity with ulceration ([place]) 11/08/2015  . CKD (chronic kidney disease) stage 3, GFR 30-59 ml/min (HCC) 11/08/2015  . Constipation due to pain medication 01/31/2015  . Obstructive sleep apnea of adult 01/13/2015  . Pelvic muscle wasting 01/13/2015  . Incomplete bladder emptying 01/13/2015  . Headache, migraine 10/24/2014  . 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 Current tobacco use 10/24/2014  . Lumbar radiculopathy, chronic 10/02/2013  . COPD with bronchial hyperresponsiveness ([place]) 10/02/2013  . Major depressive disorder, recurrent, in partial remission ([place]) 10/02/2013  . Hypertension goal BP (blood pressure) < 140/90 10/02/2013  . Multiple sclerosis ([place]) 10/02/2013  . Absence of bladder continence 09/25/2012  . Acontractile bladder 02/12/2012  . Narrowing of intervertebral disc space 11/26/2011  . Kyphoscoliosis and scoliosis 11/26/2011    Past Surgical History:  Procedure Laterality Date  . BACK SURGERY N/A 2002  .  CYST EXCISION     lower back  . INGUINAL LYMPH NODE BIOPSY Left 07/04/2016   Procedure: INGUINAL LYMPH NODE BIOPSY;  Surgeon: Christene Lye, MD;  Location: [facility] ORS;  Service: General;  Laterality: Left;  . PORTACATH PLACEMENT N/A 07/22/2016   Procedure: INSERTION PORT-A-CATH;  Surgeon: Christene Lye, MD;  Location: [facility] ORS;  Service: General;  Laterality: N/A;  .  TONSILLECTOMY AND ADENOIDECTOMY    . TUBAL LIGATION      Prior to Admission medications   Medication Sig Start Date End Date Taking? Authorizing Provider  atorvastatin (LIPITOR) 10 MG tablet Take 1 tablet (10 mg total) by mouth at bedtime. 02/04/18   Arnetha Courser, MD  BIOTIN PO Take by mouth daily. Pt taking 500 mcg daily    [provider]  buPROPion (WELLBUTRIN XL) 300 MG 24 hr tablet Take 300 mg by mouth every morning.  05/28/16   [provider]  clonazePAM (KLONOPIN) 1 MG tablet Take 1 tablet (1 mg total) by mouth at bedtime. 01/06/17   [name], MD  DULoxetine (CYMBALTA) 60 MG capsule Take 1 capsule (60 mg total) by mouth every morning. 10/14/16   Vaughan Basta, MD  furosemide (LASIX) 20 MG tablet Take 1 tablet (20 mg total) by mouth daily. STOP HCTZ 02/02/18   Lada, Satira Anis, MD  gabapentin (NEURONTIN) 600 MG tablet Take 1 tablet (600 mg total) by mouth 3 (three) times daily. 02/09/17   Bettey Costa, MD  hydrALAZINE (APRESOLINE) 50 MG tablet Take 1 tablet (50 mg total) by mouth 3 (three) times daily. Patient taking differently: Take 50 mg by mouth 2 (two) times daily.  02/02/18   Arnetha Courser, MD  interferon beta-1a (AVONEX) 30 MCG/0.5ML PSKT injection Inject 30 mcg into the muscle every Monday.    [provider]  methylphenidate (RITALIN) 10 MG tablet Take 1 tablet (10 mg total) by mouth 2 (two) times daily. 07/08/17   Bettey Costa, MD  Multiple Vitamin (MULTIVITAMIN WITH MINERALS) TABS tablet Take 1 tablet by mouth every evening.     [provider]  polyethylene glycol powder (GLYCOLAX/MIRALAX) powder Take 17 g by mouth daily as needed for mild constipation. Mixed in water Patient taking differently: Take 17 g by mouth daily as needed for mild constipation. For constipation 01/06/17   Sudini, Alveta Heimlich, MD  potassium chloride (KLOR-CON 10) 10 MEQ tablet Take 1 tablet (10 mEq total) by mouth daily. 02/02/18   Arnetha Courser, MD  QUEtiapine  Fumarate (SEROQUEL XR) 150 MG 24 hr tablet Take 150 mg by mouth at bedtime.     [provider]  vitamin C (ASCORBIC ACID) 500 MG tablet Take 500 mg by mouth every evening.     [provider]    Allergies Patient has no known allergies.  Family History  Problem Relation Age of Onset  . COPD Mother   . Diabetes Mother   . Heart failure Mother   . Alcohol abuse Father   . Kidney disease Father   . Kidney failure Father   . Arthritis Sister   . CAD Maternal Grandmother   . Stroke Maternal Grandfather   . Arthritis Sister   . Mental illness Sister   . Arthritis Brother     Social History Social History   Tobacco Use  . Smoking status: Former Smoker    Packs/day: 1.00    Years: 20.00    Pack years: 20.00    Types: Cigarettes    Start  date: 04/30/1995    Last attempt to quit: 02/03/2016    Years since quitting: 2.3  . Smokeless tobacco: Never Used  Substance Use Topics  . Alcohol use: No    Alcohol/week: 0.0 standard drinks  . Drug use: Yes    Types: Marijuana    Comment: smokes THC occasionally per pt     Review of Systems Constitutional: Generalized weakness.  Eyes: No visual changes. ENT: No sore throat. Cardiovascular: Denies chest pain. Respiratory: Denies shortness of breath. Gastrointestinal: No abdominal pain.  No nausea, no vomiting.  No diarrhea.   Genitourinary: Positive for dysuria and bad odor to her urine. Musculoskeletal: Negative for back pain. Skin: Negative for rash. Neurological: Negative for headaches, focal weakness or numbness.  ____________________________________________   PHYSICAL EXAM:  VITAL SIGNS: ED Triage Vitals  Enc Vitals Group     BP 06/11/18 1403 124/86     Pulse Rate 06/11/18 1403 (!) 58     Resp 06/11/18 1403 17     Temp 06/11/18 1401 97.9 F (36.6 C)     Temp Source 06/11/18 1401 Oral     SpO2 06/11/18 1403 100 %     Weight 06/11/18 1402 280 lb (127 kg)     Height 06/11/18 1402 5' (1.524 m)      Head Circumference --      Peak Flow --      Pain Score 06/11/18 1401 2   Constitutional: Alert and oriented.  Eyes: Conjunctivae are normal.  ENT      Head: Normocephalic and atraumatic.      Nose: No congestion/rhinnorhea.      Mouth/Throat: Mucous membranes are moist.      Neck: No stridor. Hematological/Lymphatic/Immunilogical: No cervical lymphadenopathy. Cardiovascular: Normal rate, regular rhythm.  No murmurs, rubs, or gallops.  Respiratory: Normal respiratory effort without tachypnea nor retractions. Breath sounds are clear and equal bilaterally. No wheezes/rales/rhonchi. Gastrointestinal: Soft and non tender. No rebound. No guarding.  Genitourinary: Deferred Musculoskeletal: Normal range of motion in all extremities. No lower extremity edema. Neurologic:  Normal speech and language. No gross focal neurologic deficits are appreciated.  Skin:  Skin is warm, dry and intact. No rash noted. Psychiatric: Mood and affect are normal. Speech and behavior are normal. Patient exhibits appropriate insight and judgment.  ____________________________________________    LABS (pertinent positives/negatives)  CBC wbc 5.8, hgb 11.4, plt 220 BMP wnl except cr 1.16 UA cloudy, small hgb dipstick, positive nitrite, moderate leukocytes, 21-50 wbc ____________________________________________   EKG  I, Nance Pear, attending physician, personally viewed and interpreted this EKG  EKG Time: 1445 Rate: 56 Rhythm: sinus bradycardia Axis: left axis deviation Intervals: qtc 430 QRS: LAFB ST changes: no st elevation Impression: abnormal ekg   ____________________________________________    RADIOLOGY  CT head No acute findings  ____________________________________________   PROCEDURES  Procedures  ____________________________________________   INITIAL IMPRESSION / ASSESSMENT AND PLAN / ED COURSE  Pertinent labs & imaging results that were available during my care of the  patient were reviewed by me and considered in my medical decision making (see chart for details).   Patient presented to the emergency department today because of concerns for generalized weakness, falls, caregiver states she is also noticed some confusion.  Work-up is consistent with urinary tract infection.  Patient has had this in the past.  Will plan on starting IV antibiotics and admission.  Discussed findings and plan with patient.  ____________________________________________   FINAL CLINICAL IMPRESSION(S) / ED DIAGNOSES  Final diagnoses:  Weakness  Fall, initial encounter  Lower urinary tract infectious disease     Note: This dictation was prepared with Sales executive. Any transcriptional errors that result from this process are unintentional     Nance Pear, MD 06/11/18 1600

## 2018-06-12 LAB — BASIC METABOLIC PANEL
Anion gap: 8 (ref 5–15)
BUN: 13 mg/dL (ref 8–23)
CO2: 25 mmol/L (ref 22–32)
Calcium: 8.8 mg/dL — ABNORMAL LOW (ref 8.9–10.3)
Chloride: 106 mmol/L (ref 98–111)
Creatinine, Ser: 1.03 mg/dL — ABNORMAL HIGH (ref 0.44–1.00)
GFR calc Af Amer: >60 mL/min (ref >60)
GFR calc non Af Amer: 56 mL/min — ABNORMAL LOW (ref >60)
Glucose, Bld: 120 mg/dL — ABNORMAL HIGH (ref 70–99)
Potassium: 3.6 mmol/L (ref 3.5–5.1)
Sodium: 139 mmol/L (ref 135–145)

## 2018-06-12 LAB — CBC
HCT: 35.7 % — ABNORMAL LOW (ref 36.0–46.0)
Hemoglobin: 11.3 g/dL — ABNORMAL LOW (ref 12.0–15.0)
MCH: 28.3 pg (ref 26.0–34.0)
MCHC: 31.7 g/dL (ref 30.0–36.0)
MCV: 89.3 fL (ref 80.0–100.0)
Platelets: 223 10*3/uL (ref 150–400)
RBC: 4 MIL/uL (ref 3.87–5.11)
RDW: 13.2 % (ref 11.5–15.5)
WBC: 4.9 10*3/uL (ref 4.0–10.5)
nRBC: 0 % (ref 0.0–0.2)

## 2018-06-12 LAB — MRSA PCR SCREENING: MRSA by PCR: NEGATIVE

## 2018-06-12 MED ORDER — CLONAZEPAM 0.5 MG PO TABS
0.5000 mg | ORAL_TABLET | Freq: Every day | ORAL | Status: DC
  Administered 2018-06-12 – 2018-06-13 (×2): 0.5 mg via ORAL
  Filled 2018-06-12 (×2): qty 1

## 2018-06-12 MED ORDER — POLYETHYLENE GLYCOL 3350 17 G PO PACK
17.0000 g | PACK | Freq: Every day | ORAL | Status: DC
  Administered 2018-06-12 – 2018-06-14 (×3): 17 g via ORAL
  Filled 2018-06-12 (×3): qty 1

## 2018-06-12 NOTE — Progress Notes (Signed)
[place] at [place] NAME: [name]    MR#:  [id number]  DATE OF BIRTH:  [birth year]  SUBJECTIVE:  CHIEF COMPLAINT:   Chief Complaint  Patient presents with  . Fall   The patient is more alert and awake.  She has no complaints. REVIEW OF SYSTEMS:  Review of Systems  Constitutional: Negative for chills, fever and malaise/fatigue.  HENT: Negative for sore throat.   Eyes: Negative for blurred vision and double vision.  Respiratory: Negative for cough, hemoptysis, shortness of breath, wheezing and stridor.   Cardiovascular: Negative for chest pain, palpitations, orthopnea and leg swelling.  Gastrointestinal: Negative for abdominal pain, blood in stool, diarrhea, melena, nausea and vomiting.  Genitourinary: Negative for dysuria, flank pain and hematuria.  Musculoskeletal: Negative for back pain and joint pain.  Skin: Negative for rash.  Neurological: Negative for dizziness, sensory change, focal weakness, seizures, loss of consciousness, weakness and headaches.  Endo/Heme/Allergies: Negative for polydipsia.  Psychiatric/Behavioral: Negative for depression. The patient is not nervous/anxious.     DRUG ALLERGIES:  No Known Allergies VITALS:  Blood pressure 127/61, pulse 76, temperature 98.6 F (37 C), resp. rate 18, height 5\' 4"  (1.626 m), weight (!) 152.9 kg, SpO2 91 %. PHYSICAL EXAMINATION:  Physical Exam Constitutional:      General: She is not in acute distress.    Comments: Morbid obesity.  HENT:     Head: Normocephalic.     Mouth/Throat:     Mouth: Mucous membranes are moist.  Eyes:     General: No scleral icterus.    Conjunctiva/sclera: Conjunctivae normal.     Pupils: Pupils are equal, round, and reactive to light.  Neck:     Musculoskeletal: Normal range of motion and neck supple.     Vascular: No JVD.     Trachea: No tracheal deviation.  Cardiovascular:     Rate and Rhythm: Normal rate and regular rhythm.   Heart sounds: Normal heart sounds. No murmur. No gallop.   Pulmonary:     Effort: Pulmonary effort is normal. No respiratory distress.     Breath sounds: Normal breath sounds. No wheezing or rales.  Abdominal:     General: Bowel sounds are normal. There is no distension.     Palpations: Abdomen is soft.     Tenderness: There is no abdominal tenderness. There is no rebound.  Musculoskeletal: Normal range of motion.        General: No tenderness.     Right lower leg: No edema.     Left lower leg: No edema.  Skin:    Findings: No erythema or rash.  Neurological:     General: No focal deficit present.     Mental Status: She is alert and oriented to person, place, and time.     Cranial Nerves: No cranial nerve deficit.    LABORATORY PANEL:  Female CBC Recent Labs  Lab 06/12/18 0506  WBC 4.9  HGB 11.3*  HCT 35.7*  PLT 223   ------------------------------------------------------------------------------------------------------------------ Chemistries  Recent Labs  Lab 06/12/18 0506  NA 139  K 3.6  CL 106  CO2 25  GLUCOSE 120*  BUN 13  CREATININE 1.03*  CALCIUM 8.8*   RADIOLOGY:  Ct Head Wo Contrast  Result Date: 06/11/2018 CLINICAL DATA:  Pt from home via [facility] with c/o fall in shower. Home health nurse was with pt at time of fall. Pt fell trying to sit in shower chair. Multiple falls in past  three days. CBG 88. 20G in RAC. Pt denies LOC or hitting head with fall. Alert and oriented. EXAM: CT HEAD WITHOUT CONTRAST TECHNIQUE: Contiguous axial images were obtained from the base of the skull through the vertex without intravenous contrast. COMPARISON:  04/01/2017 FINDINGS: Brain: There is moderate central and cortical atrophy. Periventricular white matter changes are consistent with small vessel disease. There is no intra or extra-axial fluid collection or mass lesion. The basilar cisterns and ventricles have a normal appearance. There is no CT evidence for acute infarction or  hemorrhage. Vascular: There is atherosclerotic calcification of the internal carotid arteries. No hyperdense vessels. Skull: Normal. Negative for fracture or focal lesion. Sinuses/Orbits: Mild mucosal thickening of the paranasal sinuses. Other: None IMPRESSION: 1. No evidence for acute intracranial abnormality. 2. Atrophy and small vessel disease. Electronically Signed   By: Nolon Nations M.D.   On: 06/11/2018 15:17   ASSESSMENT AND PLAN:   68 year old female patient with a known history of abdominal aortic atherosclerosis, COPD, chronic left leg edema, B-cell lymphoma, multiple sclerosis, neurogenic bladder with chronic Foley catheter, peripheral vascular disease of lower extremity presented to emergency room for generalized weakness and frequent falls.   -Acute urinary tract infection Continue IV Rocephin, follow-up urine culture. Prior records were reviewed Urine culture grew Citrobacter and enterococcus but we will wait for the cultures.  -Altered mental status secondary to UTI Improving.  -Gait instability and frequent falls Physical therapy evaluation for gait balance and endurance training  -Neurogenic bladder Continue Foley catheter  -Left foot cellulitis Continue IV Rocephin antibiotic  -Chronic left leg edema Clinical monitoring  -History of B-cell lymphoma Supportive care  Morbid obesity.  Diet control and follow-up PCP. All the records are reviewed and case discussed with Care Management/Social Worker. Management plans discussed with the patient, family and they are in agreement.  CODE STATUS: Full Code  TOTAL TIME TAKING CARE OF THIS PATIENT: 28 minutes.   More than 50% of the time was spent in counseling/coordination of care: YES  POSSIBLE D/C IN 1-2 DAYS, DEPENDING ON CLINICAL CONDITION.   Demetrios Loll M.D on 06/12/2018 at 1:39 PM  Between 7am to 6pm - Pager - [phone]  After 6pm go to [email or web address] - Research scientist (physical sciences) Hospitalists

## 2018-06-13 DIAGNOSIS — N390 Urinary tract infection, site not specified: Secondary | ICD-10-CM | POA: Diagnosis present

## 2018-06-13 LAB — HIV ANTIBODY (ROUTINE TESTING W REFLEX): HIV Screen 4th Generation wRfx: NONREACTIVE

## 2018-06-13 NOTE — Progress Notes (Signed)
[name] at [place] NAME: [name]    MR#:  [id number]  DATE OF BIRTH:  [birth year]  SUBJECTIVE:  CHIEF COMPLAINT:   Chief Complaint  Patient presents with  . Fall   The patient is more alert and awake.  She has no complaints. REVIEW OF SYSTEMS:  Review of Systems  Constitutional: Negative for chills, fever and malaise/fatigue.  HENT: Negative for sore throat.   Eyes: Negative for blurred vision and double vision.  Respiratory: Negative for cough, hemoptysis, shortness of breath, wheezing and stridor.   Cardiovascular: Negative for chest pain, palpitations, orthopnea and leg swelling.  Gastrointestinal: Negative for abdominal pain, blood in stool, diarrhea, melena, nausea and vomiting.  Genitourinary: Negative for dysuria, flank pain and hematuria.  Musculoskeletal: Negative for back pain and joint pain.  Skin: Negative for rash.  Neurological: Negative for dizziness, sensory change, focal weakness, seizures, loss of consciousness, weakness and headaches.  Endo/Heme/Allergies: Negative for polydipsia.  Psychiatric/Behavioral: Negative for depression. The patient is not nervous/anxious.     DRUG ALLERGIES:  No Known Allergies VITALS:  Blood pressure 138/61, pulse (!) 57, temperature 97.7 F (36.5 C), temperature source Oral, resp. rate 15, height 5\' 4"  (1.626 m), weight (!) 152.9 kg, SpO2 94 %. PHYSICAL EXAMINATION:  Physical Exam Constitutional:      General: She is not in acute distress.    Comments: Morbid obesity.  HENT:     Head: Normocephalic.     Mouth/Throat:     Mouth: Mucous membranes are moist.  Eyes:     General: No scleral icterus.    Conjunctiva/sclera: Conjunctivae normal.     Pupils: Pupils are equal, round, and reactive to light.  Neck:     Musculoskeletal: Normal range of motion and neck supple.     Vascular: No JVD.     Trachea: No tracheal deviation.  Cardiovascular:     Rate and Rhythm: Normal  rate and regular rhythm.     Heart sounds: Normal heart sounds. No murmur. No gallop.   Pulmonary:     Effort: Pulmonary effort is normal. No respiratory distress.     Breath sounds: Normal breath sounds. No wheezing or rales.  Abdominal:     General: Bowel sounds are normal. There is no distension.     Palpations: Abdomen is soft.     Tenderness: There is no abdominal tenderness. There is no rebound.  Musculoskeletal: Normal range of motion.        General: No tenderness.     Right lower leg: No edema.     Left lower leg: No edema.  Skin:    Findings: No erythema or rash.  Neurological:     General: No focal deficit present.     Mental Status: She is alert and oriented to person, place, and time.     Cranial Nerves: No cranial nerve deficit.    LABORATORY PANEL:  Female CBC Recent Labs  Lab 06/12/18 0506  WBC 4.9  HGB 11.3*  HCT 35.7*  PLT 223   ------------------------------------------------------------------------------------------------------------------ Chemistries  Recent Labs  Lab 06/12/18 0506  NA 139  K 3.6  CL 106  CO2 25  GLUCOSE 120*  BUN 13  CREATININE 1.03*  CALCIUM 8.8*   RADIOLOGY:  No results found. ASSESSMENT AND PLAN:   68 year old female patient with a known history of abdominal aortic atherosclerosis, COPD, chronic left leg edema, B-cell lymphoma, multiple sclerosis, neurogenic bladder with chronic Foley catheter, peripheral vascular  disease of lower extremity presented to emergency room for generalized weakness and frequent falls.   -Acute urinary tract infection Continue IV Rocephin, follow-up urine culture: >=100,000 COLONIES/mL ENTEROCOCCUS FAECALISAbnormal. Prior records were reviewed Urine culture grew Citrobacter and enterococcus but we will wait for the cultures.  -Altered mental status secondary to UTI Improved.  -Gait instability and frequent falls Physical therapy evaluation: HHPT.  -Neurogenic bladder Continue Foley  catheter  -Left foot cellulitis Continue IV Rocephin antibiotic  -Chronic left leg edema Clinical monitoring  -History of B-cell lymphoma Supportive care  Morbid obesity.  Diet control and follow-up PCP. All the records are reviewed and case discussed with Care Management/Social Worker. Management plans discussed with the patient, family and they are in agreement.  CODE STATUS: Full Code  TOTAL TIME TAKING CARE OF THIS PATIENT: 23 minutes.   More than 50% of the time was spent in counseling/coordination of care: YES  POSSIBLE D/C IN 1 DAYS, DEPENDING ON CLINICAL CONDITION.   Demetrios Loll M.D on 06/13/2018 at 2:38 PM  Between 7am to 6pm - Pager - [phone]  After 6pm go to [email or web address] - [employer] Hospitalists

## 2018-06-13 NOTE — Evaluation (Signed)
Physical Therapy Evaluation Patient Details Name: [name] MRN: [id number] DOB: Sep 10, 1950 Today's Date: 06/13/2018   History of Present Illness  68 y.o. female with a known history of abdominal aortic atherosclerosis, COPD, chronic left leg edema, B-cell lymphoma, multiple sclerosis, neurogenic bladder with chronic Foley catheter, peripheral vascular disease of lower extremity presented to emergency room for generalized weakness and frequent falls.  She was d/c'd from STR last week, uses rollator at home, has had mutliple falls.   Clinical Impression  Pt did relatively well with mobility and appeared to be near her likely baseline in a mental status sense.  She was hesitant to do much ambulation but did manage 2 bouts of ambulation with cues for walker use, cadence adjustment, breathing.  Pt was reliant on the walker, uses rollator at home, but felt as though she did not need to return to rehab and this PT agrees.  She would benefit from HHPT to address activity tolerance, strength and gait/mobility tasks.     Follow Up Recommendations Home health PT    Equipment Recommendations  None recommended by PT    Recommendations for Other Services       Precautions / Restrictions Precautions Precautions: Fall Restrictions Weight Bearing Restrictions: No      Mobility  Bed Mobility Overal bed mobility: Modified Independent             General bed mobility comments: Pt was slow, but confident in getting herself to sitting EOB, did need rails  Transfers Overall transfer level: Modified independent Equipment used: Rolling walker (2 wheeled)             General transfer comment: slighly elevated bed height, but pt was able to rise and maintain standing w/o direct phyiscal assist  Ambulation/Gait Ambulation/Gait assistance: Min guard Gait Distance (Feet): 15 Feet Assistive device: Rolling walker (2 wheeled)       General Gait Details: 15 ft X 2, pt did fatigue quickly and  request to sit on both bouts despite vitals remaining relatively stable. Reliant on walker, but no buckling, LOBs or overt safety concerns apart from quick to fatigue.  Stairs            Wheelchair Mobility    Modified Rankin (Stroke Patients Only)       Balance Overall balance assessment: Modified Independent(with heavy use of walker)                                           Pertinent Vitals/Pain Pain Assessment: 0-10 Pain Score: (minimal chronic pain)    Home Living Family/patient expects to be discharged to:: Private residence Living Arrangements: Spouse/significant other;Other relatives Available Help at Discharge: Family;Available 24 hours/day(close to 24/7, she states help is not as much as she'd like) Type of Home: House Home Access: Stairs to enter Entrance Stairs-Rails: Right Entrance Stairs-Number of Steps: 2 Home Layout: One level Home Equipment: Walker - 4 wheels      Prior Function Level of Independence: Needs assistance   Gait / Transfers Assistance Needed: Pt uses a 4WW for limited household distances; sleeps in a lift chair which assists her with STS transfers.           Hand Dominance        Extremity/Trunk Assessment   Upper Extremity Assessment Upper Extremity Assessment: Overall WFL for tasks assessed;Generalized weakness    Lower Extremity Assessment Lower  Extremity Assessment: Overall WFL for tasks assessed;Generalized weakness(L side weaker than R, per pt MS related)       Communication   Communication: No difficulties  Cognition Arousal/Alertness: Awake/alert Behavior During Therapy: WFL for tasks assessed/performed Overall Cognitive Status: Within Functional Limits for tasks assessed                                        General Comments      Exercises     Assessment/Plan    PT Assessment Patient needs continued PT services  PT Problem List Decreased strength;Decreased activity  tolerance;Decreased balance;Decreased mobility;Decreased knowledge of use of DME;Decreased safety awareness;Cardiopulmonary status limiting activity       PT Treatment Interventions DME instruction;Gait training;Stair training;Functional mobility training;Therapeutic activities;Therapeutic exercise;Balance training;Neuromuscular re-education;Patient/family education    PT Goals (Current goals can be found in the Care Plan section)  Acute Rehab PT Goals Patient Stated Goal: get stronger and go home PT Goal Formulation: With patient Time For Goal Achievement: 06/27/18 Potential to Achieve Goals: Fair    Frequency Min 2X/week   Barriers to discharge        Co-evaluation               AM-PAC PT "6 Clicks" Mobility  Outcome Measure Help needed turning from your back to your side while in a flat bed without using bedrails?: None Help needed moving from lying on your back to sitting on the side of a flat bed without using bedrails?: None Help needed moving to and from a bed to a chair (including a wheelchair)?: A Little Help needed standing up from a chair using your arms (e.g., wheelchair or bedside chair)?: A Little Help needed to walk in hospital room?: A Little Help needed climbing 3-5 steps with a railing? : A Lot 6 Click Score: 19    End of Session Equipment Utilized During Treatment: Gait belt Activity Tolerance: Patient limited by fatigue Patient left: with chair alarm set;with call bell/phone within reach Nurse Communication: Mobility status PT Visit Diagnosis: Difficulty in walking, not elsewhere classified (R26.2);Muscle weakness (generalized) (M62.81);Repeated falls (R29.6)    Time: 1916-6060 PT Time Calculation (min) (ACUTE ONLY): 31 min   Charges:   PT Evaluation $PT Eval Low Complexity: 1 Low PT Treatments $Gait Training: 8-22 mins        Kreg Shropshire, DPT 06/13/2018, 11:20 AM

## 2018-06-13 NOTE — Plan of Care (Signed)

## 2018-06-14 LAB — URINE CULTURE: Culture: >=100000 — AB

## 2018-06-14 MED ORDER — CLONAZEPAM 1 MG PO TABS: 0.5000 mg | ORAL_TABLET | Freq: Every day | ORAL | 0 refills | Status: DC

## 2018-06-14 MED ORDER — SODIUM CHLORIDE 0.9 % IV SOLN
1.0000 g | INTRAVENOUS | Status: DC
  Filled 2018-06-14: qty 10

## 2018-06-14 MED ORDER — LEVOFLOXACIN 500 MG PO TABS: 500.0000 mg | ORAL_TABLET | Freq: Every day | ORAL | 0 refills | Status: DC

## 2018-06-14 MED ORDER — LEVOFLOXACIN 500 MG PO TABS
500.0000 mg | ORAL_TABLET | Freq: Every day | ORAL | Status: DC
  Administered 2018-06-14: 09:00:00 500 mg via ORAL
  Filled 2018-06-14: qty 1

## 2018-06-14 NOTE — Plan of Care (Signed)

## 2018-06-14 NOTE — Discharge Summary (Signed)
[facility] NAME: [name]    MR#:  [id number]  [place]:  [birth year]  DATE OF ADMISSION:  06/11/2018   ADMITTING PHYSICIAN: Saundra Shelling, MD  DATE OF DISCHARGE: 06/14/2018  PRIMARY CARE PHYSICIAN: Kirk Ruths, MD   ADMISSION DIAGNOSIS:  Lower urinary tract infectious disease [N39.0] Weakness [R53.1] Fall, initial encounter [W19.XXXA] DISCHARGE DIAGNOSIS:  Active Problems:   Altered mental status   UTI (urinary tract infection)  SECONDARY DIAGNOSIS:   Past Medical History:  Diagnosis Date  . Abdominal aortic atherosclerosis ([place]) 11/11/2016  . ADHD   . Anxiety   . COPD (chronic obstructive pulmonary disease) ([place])   . Depression    major depressive  . Dyspnea    doe  . Edema    left leg  . Follicular lymphoma ([place])    B Cell  . Follicular lymphoma grade II ([place])   . Hypertension   . Hypotension    idiopathic  . Kyphoscoliosis and scoliosis 11/26/2011  . Morbid obesity ([place]) 01/05/2016  . Multiple sclerosis ([place])   . Multiple sclerosis ([place])    1980's  . Neuromuscular disorder ([name])   . Obstructive and reflux uropathy    foley  . Pain    atypical facial  . Peripheral vascular disease of lower extremity with ulceration ([place]) 11/08/2015  . Skin ulcer ([place]) 11/08/2015  . Weakness    generalized. has MS   HOSPITAL COURSE:  68 year old female patientwith a known history ofabdominal aortic atherosclerosis, COPD, chronic left leg edema, B-cell lymphoma, multiple sclerosis, neurogenic bladder with chronic Foley catheter, peripheral vascular disease of lower extremity presented to emergency room for generalized weakness and frequent falls.   -Acute urinary tract infection She is treated with IV Rocephin, follow-up urine culture: >=100,000 COLONIES/mL ENTEROCOCCUS FAECALISAbnormal.  Sensitive to Levaquin.  Change to Levaquin p.o.  -Altered mental status secondary to UTI Improved.  -Gait  instability and frequent falls Physical therapy evaluation: HHPT.  -Neurogenic bladder Continue Foley catheter  -Left foot cellulitis Continue IV Rocephin antibiotic  -Chronic left leg edema Clinical monitoring  -History of B-cell lymphoma Supportive care  Morbid obesity.  Diet control and follow-up PCP. DISCHARGE CONDITIONS:  Stable, discharge to ALF with home health and PT. CONSULTS OBTAINED:   DRUG ALLERGIES:  No Known Allergies DISCHARGE MEDICATIONS:   Allergies as of 06/14/2018   No Known Allergies     Medication List    STOP taking these medications   furosemide 20 MG tablet Commonly known as:  LASIX   hydrALAZINE 50 MG tablet Commonly known as:  APRESOLINE     TAKE these medications   atorvastatin 10 MG tablet Commonly known as:  LIPITOR Take 1 tablet (10 mg total) by mouth at bedtime.   BIOTIN PO Take by mouth daily. Pt taking 500 mcg daily   buPROPion 300 MG 24 hr tablet Commonly known as:  WELLBUTRIN XL Take 300 mg by mouth every morning.   clonazePAM 1 MG tablet Commonly known as:  KLONOPIN Take 0.5 tablets (0.5 mg total) by mouth at bedtime. What changed:  how much to take   DULoxetine 60 MG capsule Commonly known as:  CYMBALTA Take 1 capsule (60 mg total) by mouth every morning.   gabapentin 600 MG tablet Commonly known as:  NEURONTIN Take 1 tablet (600 mg total) by mouth 3 (three) times daily.   interferon beta-1a 30 MCG/0.5ML Pskt injection Commonly known as:  AVONEX Inject 30 mcg  into the muscle every Monday.   levofloxacin 500 MG tablet Commonly known as:  LEVAQUIN Take 1 tablet (500 mg total) by mouth daily.   methylphenidate 10 MG tablet Commonly known as:  RITALIN Take 1 tablet (10 mg total) by mouth 2 (two) times daily.   multivitamin with minerals Tabs tablet Take 1 tablet by mouth every evening.   polyethylene glycol powder powder Commonly known as:  GLYCOLAX/MIRALAX Take 17 g by mouth daily as needed for mild  constipation. Mixed in water What changed:  additional instructions   potassium chloride 10 MEQ tablet Commonly known as:  KLOR-CON 10 Take 1 tablet (10 mEq total) by mouth daily.   QUEtiapine Fumarate 150 MG 24 hr tablet Commonly known as:  SEROQUEL XR Take 150 mg by mouth at bedtime.   vitamin C 500 MG tablet Commonly known as:  ASCORBIC ACID Take 500 mg by mouth every evening.        DISCHARGE INSTRUCTIONS:  See AVS.  If you experience worsening of your admission symptoms, develop shortness of breath, life threatening emergency, suicidal or homicidal thoughts you must seek medical attention immediately by calling 911 or calling your MD immediately  if symptoms less severe.  You Must read complete instructions/literature along with all the possible adverse reactions/side effects for all the Medicines you take and that have been prescribed to you. Take any new Medicines after you have completely understood and accpet all the possible adverse reactions/side effects.   Please note  You were cared for by a hospitalist during your hospital stay. If you have any questions about your discharge medications or the care you received while you were in the hospital after you are discharged, you can call the unit and asked to speak with the hospitalist on call if the hospitalist that took care of you is not available. Once you are discharged, your primary care physician will handle any further medical issues. Please note that NO REFILLS for any discharge medications will be authorized once you are discharged, as it is imperative that you return to your primary care physician (or establish a relationship with a primary care physician if you do not have one) for your aftercare needs so that they can reassess your need for medications and monitor your lab values.    On the day of Discharge:  VITAL SIGNS:  Blood pressure 138/60, pulse 72, temperature 98.1 F (36.7 C), temperature source Oral,  resp. rate 16, height 5\' 4"  (1.626 m), weight (!) 152.9 kg, SpO2 92 %. PHYSICAL EXAMINATION:  GENERAL:  68 y.o.-year-old patient lying in the bed with no acute distress.  EYES: Pupils equal, round, reactive to light and accommodation. No scleral icterus. Extraocular muscles intact.  HEENT: Head atraumatic, normocephalic. Oropharynx and nasopharynx clear.  NECK:  Supple, no jugular venous distention. No thyroid enlargement, no tenderness.  LUNGS: Normal breath sounds bilaterally, no wheezing, rales,rhonchi or crepitation. No use of accessory muscles of respiration.  CARDIOVASCULAR: S1, S2 normal. No murmurs, rubs, or gallops.  ABDOMEN: Soft, non-tender, non-distended. Bowel sounds present. No organomegaly or mass.  EXTREMITIES: No pedal edema, cyanosis, or clubbing.  NEUROLOGIC: Cranial nerves II through XII are intact. Muscle strength 5/5 in all extremities. Sensation intact. Gait not checked.  PSYCHIATRIC: The patient is alert and oriented x 3.  SKIN: No obvious rash, lesion, or ulcer.  DATA REVIEW:   CBC Recent Labs  Lab 06/12/18 0506  WBC 4.9  HGB 11.3*  HCT 35.7*  PLT 223  Chemistries  Recent Labs  Lab 06/12/18 0506  NA 139  K 3.6  CL 106  CO2 25  GLUCOSE 120*  BUN 13  CREATININE 1.03*  CALCIUM 8.8*     Microbiology Results  Results for orders placed or performed during the hospital encounter of 06/11/18  Urine Culture     Status: Abnormal   Collection Time: 06/11/18  2:47 PM  Result Value Ref Range Status   Specimen Description   Final    URINE, RANDOM Performed at [facility], [address], [place], [place] [address]    Special Requests   Final    NONE Performed at [facility], [address] [place], [place] [address]    Culture >=100,000 COLONIES/mL ENTEROCOCCUS FAECALIS (A)  Final   Report Status 06/14/2018 FINAL  Final   Organism ID, Bacteria ENTEROCOCCUS FAECALIS (A)  Final      Susceptibility   Enterococcus faecalis -  MIC*    AMPICILLIN <=2 SENSITIVE Sensitive     LEVOFLOXACIN 1 SENSITIVE Sensitive     NITROFURANTOIN <=16 SENSITIVE Sensitive     VANCOMYCIN 1 SENSITIVE Sensitive     * >=100,000 COLONIES/mL ENTEROCOCCUS FAECALIS  MRSA PCR Screening     Status: None   Collection Time: 06/12/18  1:34 AM  Result Value Ref Range Status   MRSA by PCR NEGATIVE NEGATIVE Final    Comment:        The GeneXpert MRSA Assay (FDA approved for NASAL specimens only), is one component of a comprehensive MRSA colonization surveillance program. It is not intended to diagnose MRSA infection nor to guide or monitor treatment for MRSA infections. Performed at [facility], [address], [place], [place] [address]     RADIOLOGY:  No results found.   Management plans discussed with the patient, family and they are in agreement.  CODE STATUS: Full Code   TOTAL TIME TAKING CARE OF THIS PATIENT: 28 minutes.    Demetrios Loll M.D on 06/14/2018 at 11:11 AM  Between 7am to 6pm - Pager - [phone]  After 6pm go to [email or web address] - Technical brewer [place] [facility]  Office  [phone]  CC: Primary care physician; Kirk Ruths, MD   Note: This dictation was prepared with Dragon dictation along with smaller phrase technology. Any transcriptional errors that result from this process are unintentional.

## 2018-06-14 NOTE — Care Management Note (Signed)
Case Management Note  Patient Details  Name: [name] MRN: [id number] Date of Birth: [birth year]  Subjective/Objective:    Patient to be discharged per MD order. Orders in place for home health services. Patient currently active with [facility]. Patient prefers resumption of care with them. No DME needs. Notified [place] from [facility] of pending discharge.                 Action/Plan:   Expected Discharge Date:  06/14/18               Expected Discharge Plan:  [name]  In-House Referral:     Discharge planning Services  CM Consult  Post Acute Care Choice:  Resumption of Svcs/PTA Provider Choice offered to:  Patient  DME Arranged:    DME Agency:     HH Arranged:  RN, PT [place] Agency:  [facility]  Status of Service:  Completed, signed off  If discussed at [place] of Stay Meetings, dates discussed:    Additional Comments:  Latanya Maudlin, RN 06/14/2018, 9:36 AM

## 2018-06-14 NOTE — Plan of Care (Signed)
Pt is d/ced home with home health.  Has chronic foley b/c of neurogenic bladder.  Was found to have UTI.  Will be on levaquin for next 3 days.  Started today.  Had to replace foley.  Came out when she was getting a bath.  Had stool today after getting miralax.  Pt is up to [facility] with assistance.  Reviewed d/c instructions.  Suggested she call PCP tomorrow and let him know she was admitted for UTI.  IV removed.  Husband came to pick her up.

## 2018-06-14 NOTE — Discharge Instructions (Signed)
HHPT °

## 2018-06-14 NOTE — Care Management Obs Status (Signed)
[place] NOTIFICATION   Patient Details  Name: [name] MRN: [id number] Date of Birth: [birth year]   Medicare Observation Status Notification Given:  Yes    Dewon Mendizabal A Briony Parveen, RN 06/14/2018, 9:54 AM

## 2018-06-18 ENCOUNTER — Telehealth: Payer: Self-pay | Admitting: Family Medicine

## 2018-06-18 NOTE — Telephone Encounter (Signed)
Copied from [place] [phone]. Topic: General - Other >> Jun 18, 2018  3:49 PM Windy Kalata wrote: Reason for CRM: [facility] is calling asking if orders were received for PT, faxed on 05/14/18  Call back is [phone] ext [phone]  Okay to leave message

## 2018-06-18 NOTE — Telephone Encounter (Signed)
Copied from [place] [phone]. Topic: General - Other >> Jun 18, 2018  3:49 PM Windy Kalata wrote: Reason for CRM: D[facility] from [facility] is calling asking if orders were received for PT, faxed on 05/14/18  Call back is [phone] ext [phone]  Okay to leave message

## 2018-06-19 NOTE — Telephone Encounter (Signed)
Duplicate encounter

## 2018-06-19 NOTE — Telephone Encounter (Signed)
LVM for [name] with [facility] informing him that patient has transferred PCP care to Dr. [name] with [facility] and we would have to call their office to obtain PT orders.

## 2018-07-07 ENCOUNTER — Encounter: Payer: Self-pay | Admitting: Podiatry

## 2018-07-10 NOTE — Progress Notes (Signed)
This encounter was created in error - please disregard.

## 2018-10-15 ENCOUNTER — Other Ambulatory Visit: Payer: Self-pay | Admitting: Family Medicine

## 2018-10-21 ENCOUNTER — Other Ambulatory Visit: Payer: Self-pay | Admitting: Neurology

## 2018-10-21 DIAGNOSIS — G35 Multiple sclerosis: Secondary | ICD-10-CM

## 2018-11-06 ENCOUNTER — Inpatient Hospital Stay: Payer: Medicare Other

## 2018-11-06 ENCOUNTER — Inpatient Hospital Stay: Payer: Medicare Other | Admitting: Oncology

## 2018-11-09 ENCOUNTER — Other Ambulatory Visit: Payer: Self-pay | Admitting: Neurology

## 2018-11-09 ENCOUNTER — Ambulatory Visit
Admission: RE | Admit: 2018-11-09 | Discharge: 2018-11-09 | Disposition: A | Discharge status: Home / Self Care | Payer: Medicare Other | Source: Ambulatory Visit | Attending: Neurology | Admitting: Neurology

## 2018-11-09 ENCOUNTER — Other Ambulatory Visit: Payer: Self-pay

## 2018-11-09 DIAGNOSIS — G35 Multiple sclerosis: Secondary | ICD-10-CM | POA: Insufficient documentation

## 2018-11-14 ENCOUNTER — Other Ambulatory Visit: Payer: Self-pay | Admitting: Family Medicine

## 2018-11-19 ENCOUNTER — Inpatient Hospital Stay: Payer: Medicare Other | Admitting: Oncology

## 2018-11-19 ENCOUNTER — Inpatient Hospital Stay: Payer: Medicare Other | Attending: Oncology

## 2018-12-01 IMAGING — CR DG CHEST 2V
1 series · 2 of 2 positions shown · non-contrast
Comparison: Radiographs April 11, 2017.

CLINICAL DATA: Left lower leg erythema.

EXAM:
CHEST - 2 VIEW

[Series 1: x chest ap · 0.14mm/px · 2 of 2 slices shown]
[im 1/2]
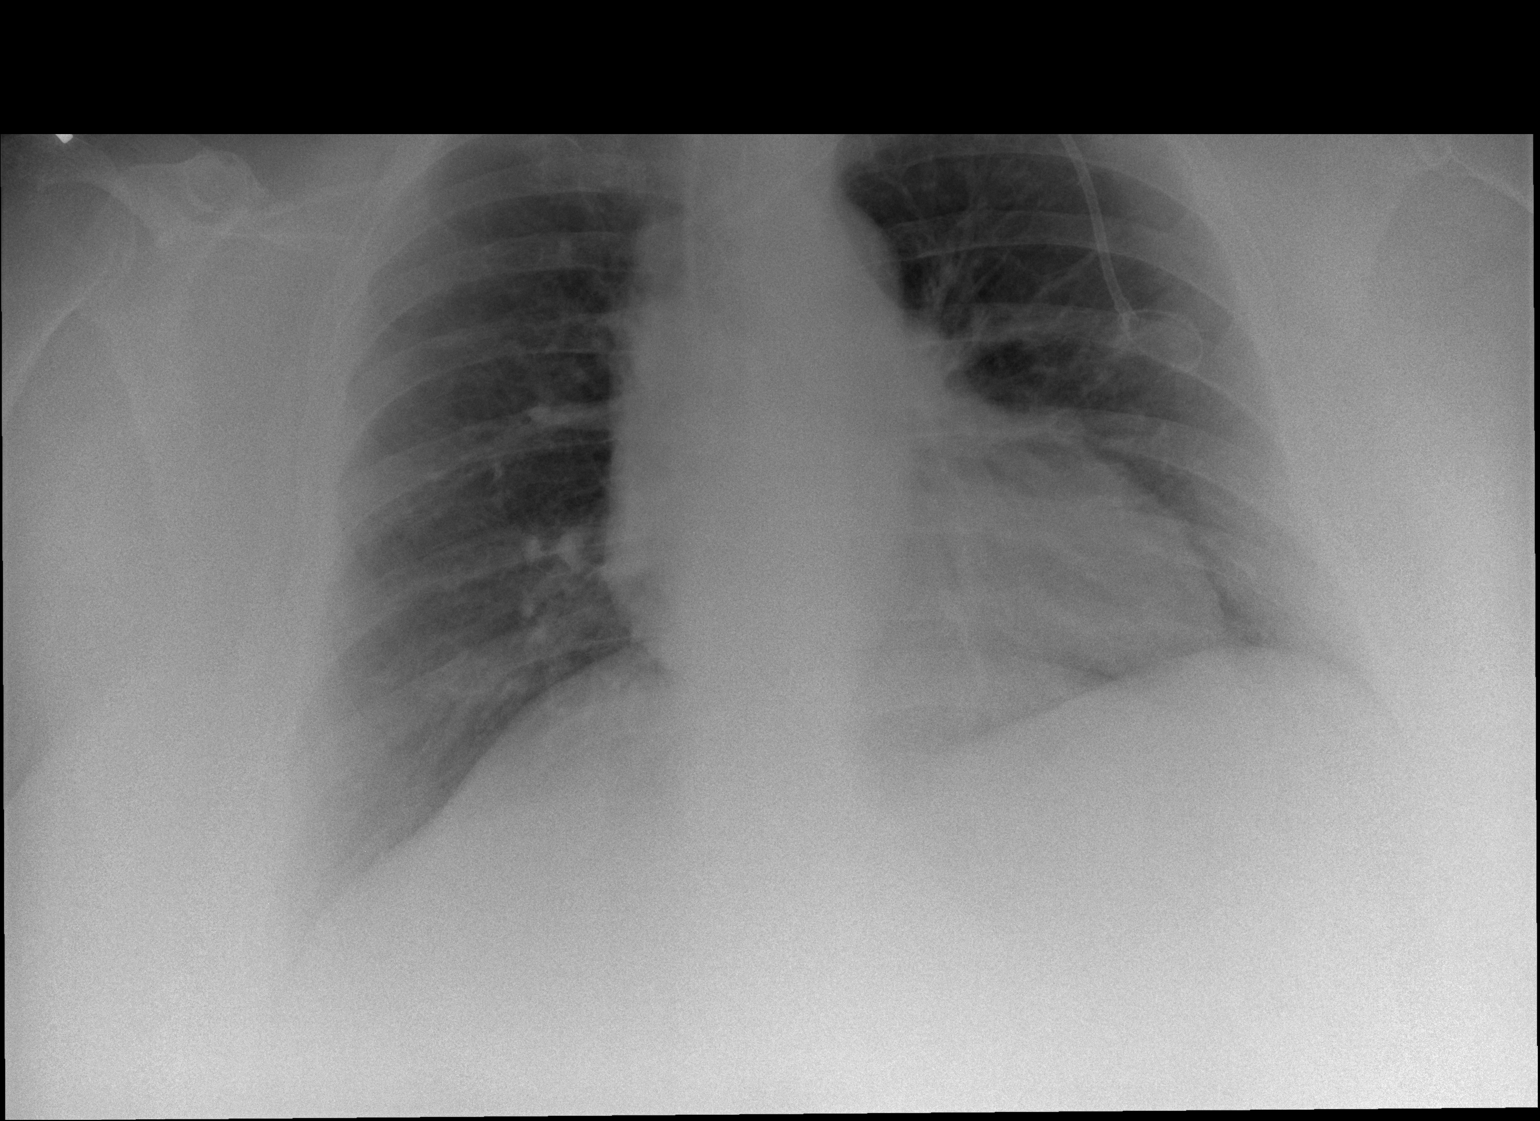
[im 2/2]
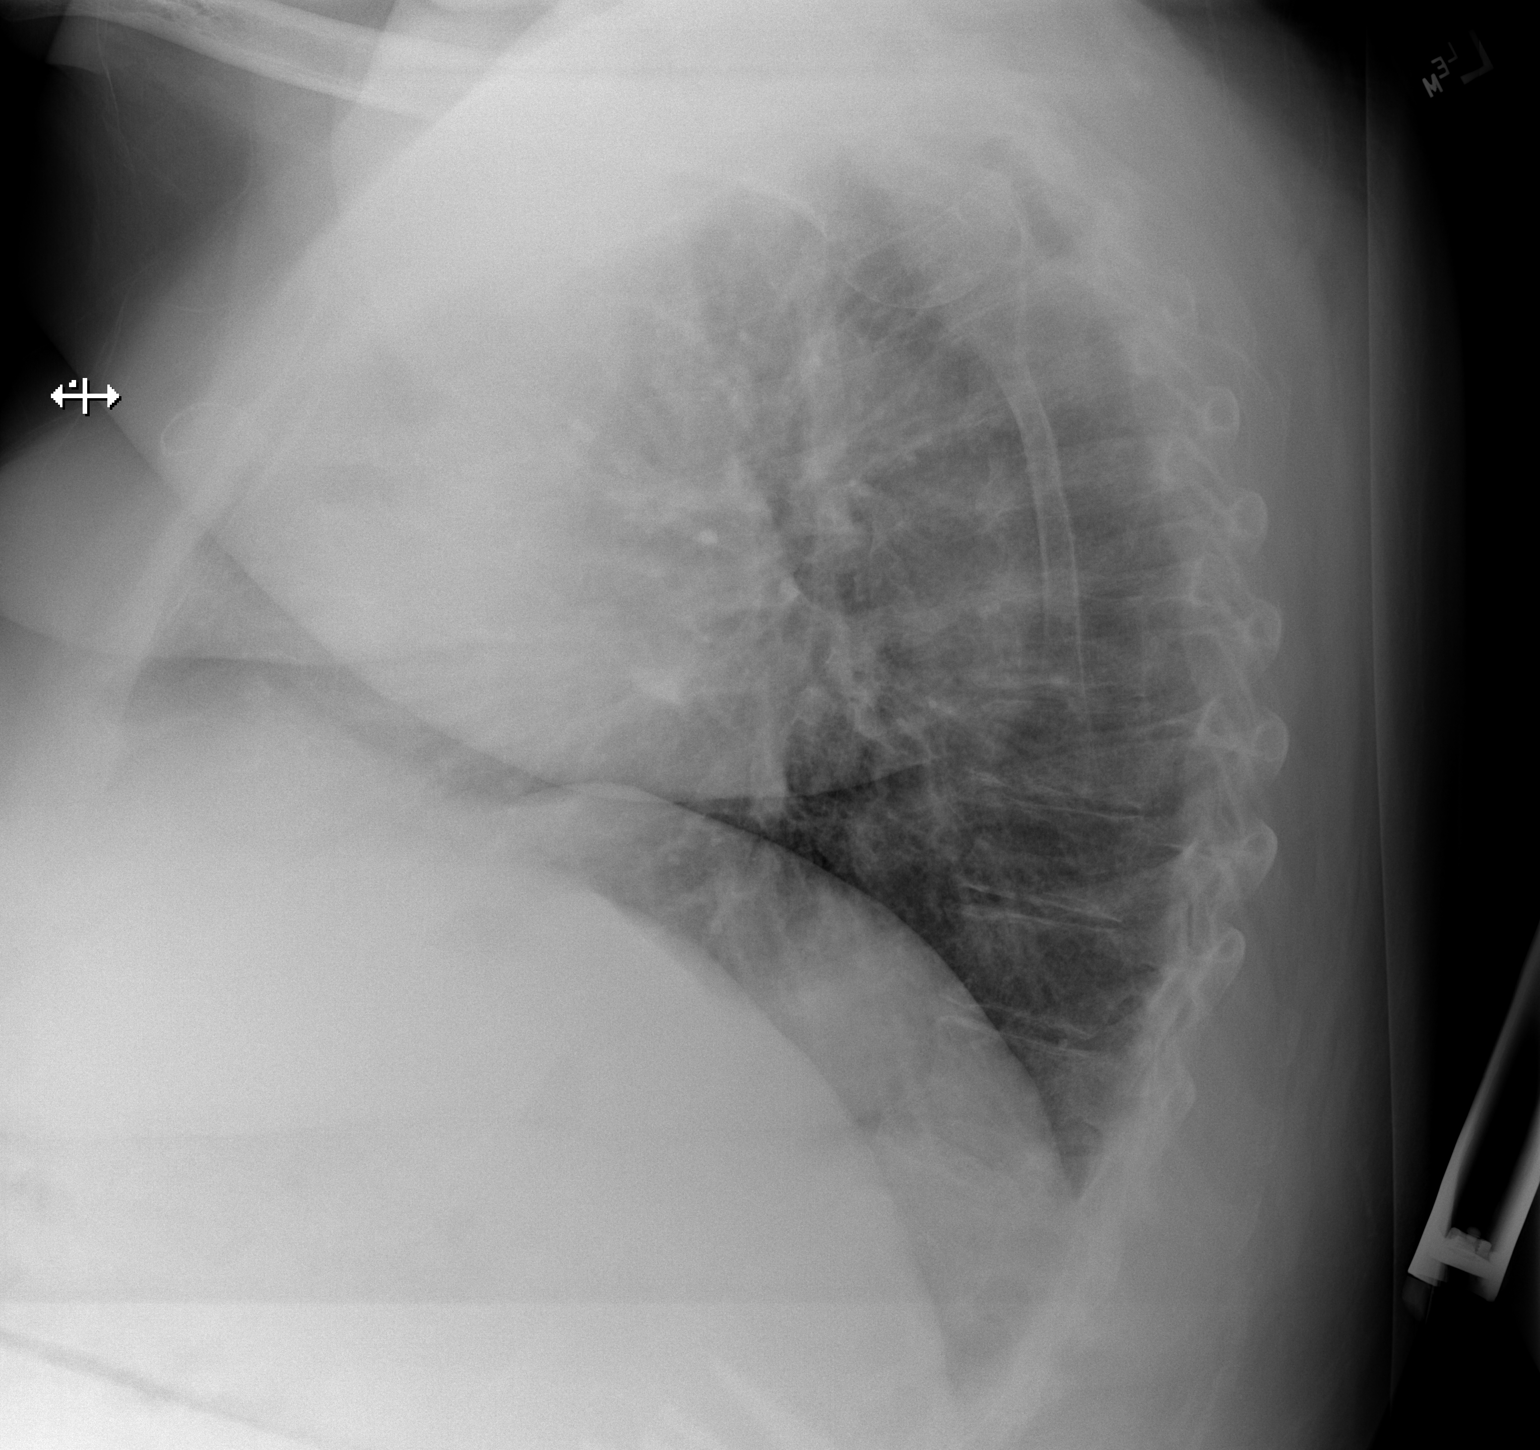

[2 of 2 positions shown; findings below may reference images not displayed]

FINDINGS: The heart size and mediastinal contours are within normal limits.
Both lungs are clear. No pneumothorax or pleural effusion is noted.
Left internal jugular Port-A-Cath is unchanged with distal tip in
expected position of SVC. The visualized skeletal structures are
unremarkable.
IMPRESSION: No active cardiopulmonary disease.

## 2018-12-03 ENCOUNTER — Telehealth: Payer: Self-pay

## 2018-12-03 MED ORDER — MYRBETRIQ 50 MG PO TB24: ORAL_TABLET | ORAL | 0 refills | Status: DC

## 2018-12-03 NOTE — Telephone Encounter (Signed)
1 refill of Myrbetriq 50 sent to [facility]. Patient needs appt for future refills

## 2018-12-23 ENCOUNTER — Telehealth: Payer: Self-pay | Admitting: Oncology

## 2018-12-23 NOTE — Telephone Encounter (Signed)
PT does not have VM set up; was unable to lm to try and R/S N/S appt. 12/23/2018

## 2019-01-04 ENCOUNTER — Emergency Department: Payer: Medicare Other

## 2019-01-04 ENCOUNTER — Inpatient Hospital Stay
Admission: EM | Admit: 2019-01-04 | Discharge: 2019-01-08 | DRG: 698 | Disposition: A | Discharge status: Home Health | Payer: Medicare Other | Attending: Internal Medicine | Admitting: Internal Medicine

## 2019-01-04 ENCOUNTER — Other Ambulatory Visit: Payer: Self-pay

## 2019-01-04 ENCOUNTER — Encounter: Payer: Self-pay | Admitting: *Deleted

## 2019-01-04 DIAGNOSIS — N390 Urinary tract infection, site not specified: Secondary | ICD-10-CM | POA: Diagnosis present

## 2019-01-04 DIAGNOSIS — F1290 Cannabis use, unspecified, uncomplicated: Secondary | ICD-10-CM | POA: Diagnosis present

## 2019-01-04 DIAGNOSIS — L03116 Cellulitis of left lower limb: Secondary | ICD-10-CM | POA: Diagnosis present

## 2019-01-04 DIAGNOSIS — F419 Anxiety disorder, unspecified: Secondary | ICD-10-CM | POA: Diagnosis present

## 2019-01-04 DIAGNOSIS — Y846 Urinary catheterization as the cause of abnormal reaction of the patient, or of later complication, without mention of misadventure at the time of the procedure: Secondary | ICD-10-CM | POA: Diagnosis present

## 2019-01-04 DIAGNOSIS — F909 Attention-deficit hyperactivity disorder, unspecified type: Secondary | ICD-10-CM | POA: Diagnosis present

## 2019-01-04 DIAGNOSIS — Z8572 Personal history of non-Hodgkin lymphomas: Secondary | ICD-10-CM

## 2019-01-04 DIAGNOSIS — N319 Neuromuscular dysfunction of bladder, unspecified: Secondary | ICD-10-CM | POA: Diagnosis present

## 2019-01-04 DIAGNOSIS — A419 Sepsis, unspecified organism: Secondary | ICD-10-CM | POA: Diagnosis present

## 2019-01-04 DIAGNOSIS — F329 Major depressive disorder, single episode, unspecified: Secondary | ICD-10-CM | POA: Diagnosis present

## 2019-01-04 DIAGNOSIS — R296 Repeated falls: Secondary | ICD-10-CM | POA: Diagnosis present

## 2019-01-04 DIAGNOSIS — I129 Hypertensive chronic kidney disease with stage 1 through stage 4 chronic kidney disease, or unspecified chronic kidney disease: Secondary | ICD-10-CM | POA: Diagnosis present

## 2019-01-04 DIAGNOSIS — Z20828 Contact with and (suspected) exposure to other viral communicable diseases: Secondary | ICD-10-CM | POA: Diagnosis present

## 2019-01-04 DIAGNOSIS — N83202 Unspecified ovarian cyst, left side: Secondary | ICD-10-CM | POA: Diagnosis present

## 2019-01-04 DIAGNOSIS — Z825 Family history of asthma and other chronic lower respiratory diseases: Secondary | ICD-10-CM

## 2019-01-04 DIAGNOSIS — Z87891 Personal history of nicotine dependence: Secondary | ICD-10-CM

## 2019-01-04 DIAGNOSIS — K029 Dental caries, unspecified: Secondary | ICD-10-CM | POA: Diagnosis present

## 2019-01-04 DIAGNOSIS — A401 Sepsis due to streptococcus, group B: Secondary | ICD-10-CM | POA: Diagnosis present

## 2019-01-04 DIAGNOSIS — I700 Atherosclerosis of aorta: Secondary | ICD-10-CM | POA: Diagnosis present

## 2019-01-04 DIAGNOSIS — N179 Acute kidney failure, unspecified: Secondary | ICD-10-CM | POA: Diagnosis not present

## 2019-01-04 DIAGNOSIS — J449 Chronic obstructive pulmonary disease, unspecified: Secondary | ICD-10-CM | POA: Diagnosis present

## 2019-01-04 DIAGNOSIS — Z6841 Body Mass Index (BMI) 40.0 and over, adult: Secondary | ICD-10-CM

## 2019-01-04 DIAGNOSIS — G4733 Obstructive sleep apnea (adult) (pediatric): Secondary | ICD-10-CM | POA: Diagnosis present

## 2019-01-04 DIAGNOSIS — T83518A Infection and inflammatory reaction due to other urinary catheter, initial encounter: Secondary | ICD-10-CM | POA: Diagnosis not present

## 2019-01-04 DIAGNOSIS — Z841 Family history of disorders of kidney and ureter: Secondary | ICD-10-CM

## 2019-01-04 DIAGNOSIS — N183 Chronic kidney disease, stage 3 (moderate): Secondary | ICD-10-CM | POA: Diagnosis present

## 2019-01-04 DIAGNOSIS — Z79899 Other long term (current) drug therapy: Secondary | ICD-10-CM

## 2019-01-04 DIAGNOSIS — Z8249 Family history of ischemic heart disease and other diseases of the circulatory system: Secondary | ICD-10-CM

## 2019-01-04 DIAGNOSIS — Z7401 Bed confinement status: Secondary | ICD-10-CM

## 2019-01-04 DIAGNOSIS — I739 Peripheral vascular disease, unspecified: Secondary | ICD-10-CM | POA: Diagnosis present

## 2019-01-04 DIAGNOSIS — N139 Obstructive and reflux uropathy, unspecified: Secondary | ICD-10-CM | POA: Diagnosis present

## 2019-01-04 DIAGNOSIS — G35 Multiple sclerosis: Secondary | ICD-10-CM | POA: Diagnosis present

## 2019-01-04 LAB — CBC WITH DIFFERENTIAL/PLATELET
Abs Immature Granulocytes: 0.06 10*3/uL (ref 0.00–0.07)
Basophils Absolute: 0.1 10*3/uL (ref 0.0–0.1)
Basophils Relative: 1 %
Eosinophils Absolute: 0.1 10*3/uL (ref 0.0–0.5)
Eosinophils Relative: 0 %
HCT: 38 % (ref 36.0–46.0)
Hemoglobin: 12.2 g/dL (ref 12.0–15.0)
Immature Granulocytes: 0 %
Lymphocytes Relative: 2 %
Lymphs Abs: 0.3 10*3/uL — ABNORMAL LOW (ref 0.7–4.0)
MCH: 28.4 pg (ref 26.0–34.0)
MCHC: 32.1 g/dL (ref 30.0–36.0)
MCV: 88.4 fL (ref 80.0–100.0)
Monocytes Absolute: 1 10*3/uL (ref 0.1–1.0)
Monocytes Relative: 6 %
Neutro Abs: 14.1 10*3/uL — ABNORMAL HIGH (ref 1.7–7.7)
Neutrophils Relative %: 91 %
Platelets: 265 10*3/uL (ref 150–400)
RBC: 4.3 MIL/uL (ref 3.87–5.11)
RDW: 13.5 % (ref 11.5–15.5)
WBC: 15.6 10*3/uL — ABNORMAL HIGH (ref 4.0–10.5)
nRBC: 0 % (ref 0.0–0.2)

## 2019-01-04 NOTE — ED Triage Notes (Signed)
Pt arrived from home via EMS with c/o fever, chills and weakness. Pt has an indewelling urinary cath and a hx of MS. Pt statrting feeling bad yesterday

## 2019-01-05 ENCOUNTER — Emergency Department: Payer: Medicare Other

## 2019-01-05 ENCOUNTER — Other Ambulatory Visit: Payer: Self-pay

## 2019-01-05 DIAGNOSIS — N319 Neuromuscular dysfunction of bladder, unspecified: Secondary | ICD-10-CM | POA: Diagnosis present

## 2019-01-05 DIAGNOSIS — F329 Major depressive disorder, single episode, unspecified: Secondary | ICD-10-CM | POA: Diagnosis present

## 2019-01-05 DIAGNOSIS — F419 Anxiety disorder, unspecified: Secondary | ICD-10-CM | POA: Diagnosis present

## 2019-01-05 DIAGNOSIS — R7881 Bacteremia: Secondary | ICD-10-CM | POA: Diagnosis not present

## 2019-01-05 DIAGNOSIS — A419 Sepsis, unspecified organism: Secondary | ICD-10-CM | POA: Diagnosis present

## 2019-01-05 DIAGNOSIS — N83202 Unspecified ovarian cyst, left side: Secondary | ICD-10-CM | POA: Diagnosis present

## 2019-01-05 DIAGNOSIS — J449 Chronic obstructive pulmonary disease, unspecified: Secondary | ICD-10-CM | POA: Diagnosis present

## 2019-01-05 DIAGNOSIS — I739 Peripheral vascular disease, unspecified: Secondary | ICD-10-CM | POA: Diagnosis present

## 2019-01-05 DIAGNOSIS — A401 Sepsis due to streptococcus, group B: Secondary | ICD-10-CM | POA: Diagnosis present

## 2019-01-05 DIAGNOSIS — T83518A Infection and inflammatory reaction due to other urinary catheter, initial encounter: Secondary | ICD-10-CM | POA: Diagnosis present

## 2019-01-05 DIAGNOSIS — B951 Streptococcus, group B, as the cause of diseases classified elsewhere: Secondary | ICD-10-CM | POA: Diagnosis not present

## 2019-01-05 DIAGNOSIS — L03116 Cellulitis of left lower limb: Secondary | ICD-10-CM | POA: Diagnosis present

## 2019-01-05 DIAGNOSIS — N183 Chronic kidney disease, stage 3 (moderate): Secondary | ICD-10-CM | POA: Diagnosis present

## 2019-01-05 DIAGNOSIS — Y846 Urinary catheterization as the cause of abnormal reaction of the patient, or of later complication, without mention of misadventure at the time of the procedure: Secondary | ICD-10-CM | POA: Diagnosis present

## 2019-01-05 DIAGNOSIS — G4733 Obstructive sleep apnea (adult) (pediatric): Secondary | ICD-10-CM | POA: Diagnosis present

## 2019-01-05 DIAGNOSIS — G35 Multiple sclerosis: Secondary | ICD-10-CM | POA: Diagnosis present

## 2019-01-05 DIAGNOSIS — Z6841 Body Mass Index (BMI) 40.0 and over, adult: Secondary | ICD-10-CM | POA: Diagnosis not present

## 2019-01-05 DIAGNOSIS — R296 Repeated falls: Secondary | ICD-10-CM | POA: Diagnosis present

## 2019-01-05 DIAGNOSIS — N139 Obstructive and reflux uropathy, unspecified: Secondary | ICD-10-CM | POA: Diagnosis present

## 2019-01-05 DIAGNOSIS — F1290 Cannabis use, unspecified, uncomplicated: Secondary | ICD-10-CM | POA: Diagnosis present

## 2019-01-05 DIAGNOSIS — F909 Attention-deficit hyperactivity disorder, unspecified type: Secondary | ICD-10-CM | POA: Diagnosis present

## 2019-01-05 DIAGNOSIS — I700 Atherosclerosis of aorta: Secondary | ICD-10-CM | POA: Diagnosis present

## 2019-01-05 DIAGNOSIS — N390 Urinary tract infection, site not specified: Secondary | ICD-10-CM | POA: Diagnosis present

## 2019-01-05 DIAGNOSIS — I361 Nonrheumatic tricuspid (valve) insufficiency: Secondary | ICD-10-CM | POA: Diagnosis not present

## 2019-01-05 DIAGNOSIS — I129 Hypertensive chronic kidney disease with stage 1 through stage 4 chronic kidney disease, or unspecified chronic kidney disease: Secondary | ICD-10-CM | POA: Diagnosis present

## 2019-01-05 DIAGNOSIS — K029 Dental caries, unspecified: Secondary | ICD-10-CM | POA: Diagnosis present

## 2019-01-05 DIAGNOSIS — Z20828 Contact with and (suspected) exposure to other viral communicable diseases: Secondary | ICD-10-CM | POA: Diagnosis present

## 2019-01-05 DIAGNOSIS — N179 Acute kidney failure, unspecified: Secondary | ICD-10-CM | POA: Diagnosis present

## 2019-01-05 DIAGNOSIS — K0889 Other specified disorders of teeth and supporting structures: Secondary | ICD-10-CM | POA: Diagnosis not present

## 2019-01-05 LAB — COMPREHENSIVE METABOLIC PANEL
ALT: 22 U/L (ref 0–44)
AST: 23 U/L (ref 15–41)
Albumin: 4.4 g/dL (ref 3.5–5.0)
Alkaline Phosphatase: 82 U/L (ref 38–126)
Anion gap: 13 (ref 5–15)
BUN: 27 mg/dL — ABNORMAL HIGH (ref 8–23)
CO2: 23 mmol/L (ref 22–32)
Calcium: 9.4 mg/dL (ref 8.9–10.3)
Chloride: 100 mmol/L (ref 98–111)
Creatinine, Ser: 1.84 mg/dL — ABNORMAL HIGH (ref 0.44–1.00)
GFR calc Af Amer: 32 mL/min — ABNORMAL LOW (ref >60)
GFR calc non Af Amer: 28 mL/min — ABNORMAL LOW (ref >60)
Glucose, Bld: 125 mg/dL — ABNORMAL HIGH (ref 70–99)
Potassium: 4.5 mmol/L (ref 3.5–5.1)
Sodium: 136 mmol/L (ref 135–145)
Total Bilirubin: 0.5 mg/dL (ref 0.3–1.2)
Total Protein: 8.1 g/dL (ref 6.5–8.1)

## 2019-01-05 LAB — URINALYSIS, ROUTINE W REFLEX MICROSCOPIC
Bilirubin Urine: NEGATIVE
Glucose, UA: NEGATIVE mg/dL
Ketones, ur: NEGATIVE mg/dL
Nitrite: NEGATIVE
Protein, ur: 100 mg/dL — AB
RBC / HPF: >50 RBC/hpf — ABNORMAL HIGH (ref 0–5)
Specific Gravity, Urine: 1.025 (ref 1.005–1.030)
WBC, UA: >50 WBC/hpf — ABNORMAL HIGH (ref 0–5)
pH: 5 (ref 5.0–8.0)

## 2019-01-05 LAB — BLOOD CULTURE ID PANEL (REFLEXED)

## 2019-01-05 LAB — PROTIME-INR
INR: 1 (ref 0.8–1.2)
Prothrombin Time: 13.3 seconds (ref 11.4–15.2)

## 2019-01-05 LAB — APTT: aPTT: 38 seconds — ABNORMAL HIGH (ref 24–36)

## 2019-01-05 LAB — LACTIC ACID, PLASMA
Lactic Acid, Venous: 1.7 mmol/L (ref 0.5–1.9)
Lactic Acid, Venous: 2.8 mmol/L (ref 0.5–1.9)

## 2019-01-05 LAB — SARS CORONAVIRUS 2 BY RT PCR (HOSPITAL ORDER, PERFORMED IN ~~LOC~~ HOSPITAL LAB): SARS Coronavirus 2: NEGATIVE

## 2019-01-05 LAB — TSH: TSH: 2.477 u[IU]/mL (ref 0.350–4.500)

## 2019-01-05 MED ORDER — ONDANSETRON HCL 4 MG/2ML IJ SOLN
4.0000 mg | Freq: Once | INTRAMUSCULAR | Status: AC
  Administered 2019-01-05: 4 mg via INTRAVENOUS
  Filled 2019-01-05: qty 2

## 2019-01-05 MED ORDER — ACETAMINOPHEN 650 MG RE SUPP: 650.0000 mg | Freq: Four times a day (QID) | RECTAL | Status: DC | PRN

## 2019-01-05 MED ORDER — IPRATROPIUM-ALBUTEROL 0.5-2.5 (3) MG/3ML IN SOLN
3.0000 mL | Freq: Once | RESPIRATORY_TRACT | Status: AC
  Administered 2019-01-05: 3 mL via RESPIRATORY_TRACT
  Filled 2019-01-05: qty 3

## 2019-01-05 MED ORDER — ACETAMINOPHEN 325 MG PO TABS: 650.0000 mg | ORAL_TABLET | Freq: Four times a day (QID) | ORAL | Status: DC | PRN

## 2019-01-05 MED ORDER — HEPARIN SODIUM (PORCINE) 5000 UNIT/ML IJ SOLN
5000.0000 [IU] | Freq: Three times a day (TID) | INTRAMUSCULAR | Status: DC
  Administered 2019-01-05 – 2019-01-08 (×10): 5000 [IU] via SUBCUTANEOUS
  Filled 2019-01-05 (×10): qty 1

## 2019-01-05 MED ORDER — SODIUM CHLORIDE 0.9 % IV BOLUS
1000.0000 mL | Freq: Once | INTRAVENOUS | Status: AC
  Administered 2019-01-05: 1000 mL via INTRAVENOUS

## 2019-01-05 MED ORDER — TRAMADOL HCL 50 MG PO TABS
50.0000 mg | ORAL_TABLET | Freq: Four times a day (QID) | ORAL | Status: DC | PRN
  Administered 2019-01-05 – 2019-01-06 (×3): 50 mg via ORAL
  Filled 2019-01-05 (×3): qty 1

## 2019-01-05 MED ORDER — MIRABEGRON ER 50 MG PO TB24
50.0000 mg | ORAL_TABLET | Freq: Every day | ORAL | Status: DC
  Administered 2019-01-05 – 2019-01-08 (×4): 50 mg via ORAL
  Filled 2019-01-05 (×4): qty 1

## 2019-01-05 MED ORDER — ONDANSETRON HCL 4 MG/2ML IJ SOLN: 4.0000 mg | Freq: Four times a day (QID) | INTRAMUSCULAR | Status: DC | PRN

## 2019-01-05 MED ORDER — ONDANSETRON HCL 4 MG PO TABS
4.0000 mg | ORAL_TABLET | Freq: Four times a day (QID) | ORAL | Status: DC | PRN
  Administered 2019-01-06: 4 mg via ORAL
  Filled 2019-01-05: qty 1

## 2019-01-05 MED ORDER — DOCUSATE SODIUM 100 MG PO CAPS
100.0000 mg | ORAL_CAPSULE | Freq: Two times a day (BID) | ORAL | Status: DC
  Administered 2019-01-05 – 2019-01-08 (×5): 100 mg via ORAL
  Filled 2019-01-05 (×6): qty 1

## 2019-01-05 MED ORDER — SODIUM CHLORIDE 0.9 % IV SOLN
INTRAVENOUS | Status: DC
  Administered 2019-01-05 – 2019-01-08 (×7): via INTRAVENOUS

## 2019-01-05 MED ORDER — SODIUM CHLORIDE 0.9 % IV SOLN
2.0000 g | Freq: Once | INTRAVENOUS | Status: AC
  Administered 2019-01-05: 2 g via INTRAVENOUS
  Filled 2019-01-05: qty 2

## 2019-01-05 MED ORDER — VANCOMYCIN HCL IN DEXTROSE 1-5 GM/200ML-% IV SOLN
1000.0000 mg | Freq: Once | INTRAVENOUS | Status: AC
  Administered 2019-01-05: 1000 mg via INTRAVENOUS
  Filled 2019-01-05: qty 200

## 2019-01-05 MED ORDER — LACTATED RINGERS IV BOLUS
1000.0000 mL | Freq: Once | INTRAVENOUS | Status: AC
  Administered 2019-01-05: 1000 mL via INTRAVENOUS

## 2019-01-05 MED ORDER — PIPERACILLIN-TAZOBACTAM 3.375 G IVPB
3.3750 g | Freq: Three times a day (TID) | INTRAVENOUS | Status: DC
  Administered 2019-01-05 – 2019-01-06 (×4): 3.375 g via INTRAVENOUS
  Filled 2019-01-05 (×4): qty 50

## 2019-01-05 MED ORDER — MORPHINE SULFATE (PF) 2 MG/ML IV SOLN
1.0000 mg | INTRAVENOUS | Status: DC | PRN
  Administered 2019-01-05 – 2019-01-06 (×2): 1 mg via INTRAVENOUS
  Filled 2019-01-05 (×2): qty 1

## 2019-01-05 MED ORDER — METRONIDAZOLE IN NACL 5-0.79 MG/ML-% IV SOLN
500.0000 mg | Freq: Once | INTRAVENOUS | Status: AC
  Administered 2019-01-05: 500 mg via INTRAVENOUS
  Filled 2019-01-05: qty 100

## 2019-01-05 MED ORDER — ACETAMINOPHEN 500 MG PO TABS
1000.0000 mg | ORAL_TABLET | Freq: Once | ORAL | Status: AC
  Administered 2019-01-05: 1000 mg via ORAL
  Filled 2019-01-05: qty 2

## 2019-01-05 MED ORDER — HYDROMORPHONE HCL 1 MG/ML IJ SOLN
0.5000 mg | Freq: Once | INTRAMUSCULAR | Status: AC
  Administered 2019-01-05: 0.5 mg via INTRAVENOUS
  Filled 2019-01-05: qty 1

## 2019-01-05 NOTE — Plan of Care (Signed)
  Problem: Urinary Elimination: Goal: Signs and symptoms of infection will decrease Outcome: Progressing   Problem: Clinical Measurements: Goal: Ability to avoid or minimize complications of infection will improve Outcome: Progressing   Problem: Skin Integrity: Goal: Skin integrity will improve Outcome: Progressing   Problem: Fluid Volume: Goal: Hemodynamic stability will improve Outcome: Progressing   Problem: Clinical Measurements: Goal: Diagnostic test results will improve Outcome: Progressing Goal: Signs and symptoms of infection will decrease Outcome: Progressing   Problem: Respiratory: Goal: Ability to maintain adequate ventilation will improve Outcome: Progressing

## 2019-01-05 NOTE — ED Notes (Signed)
Patient moved to hospital bed for comfort. Patient with incontinence of stool noted.  Patient cleaned and new brief and bed pad given. Catheter care preformed. Patient placed in clean gown. Call light in place and bed controls given to patient. No further needs expressed at this time.

## 2019-01-05 NOTE — ED Notes (Signed)
Pt sitting in bed speaking with this RN in NAD, A&Ox4.  

## 2019-01-05 NOTE — Progress Notes (Signed)
Pt admitted for UTI / Possible sepsis.  Also c/o weakness BLE and some oral pain.  Oriented to room and unit.  Placed on monitor.  VSS.  NAD.

## 2019-01-05 NOTE — Progress Notes (Signed)
PHARMACY - PHYSICIAN COMMUNICATION CRITICAL VALUE ALERT - BLOOD CULTURE IDENTIFICATION (BCID)  [name] is an 68 y.o. female who presented to [facility] on 01/04/2019 with a chief complaint of Fever  Assessment:  Sepsis/Fever of Unknown Source (likely urinary per indwelling catheter)   1/4 bottles Aerobic only -  GPC - Strep Species, Agalactiae (Group B Strep)  Name of physician (or Provider) Contacted: [place]  Current antibiotics: Zosyn  Changes to prescribed antibiotics recommended:  Continue current broad spectrum coverage - consider Penicillin G 4 million units IV q4h per protocol if grows in both bottles  Results for orders placed or performed during the hospital encounter of 01/04/19  Blood Culture ID Panel (Reflexed) (Collected: 01/04/2019 11:40 PM)  Result Value Ref Range   Enterococcus species NOT DETECTED NOT DETECTED   Listeria monocytogenes NOT DETECTED NOT DETECTED   Staphylococcus species NOT DETECTED NOT DETECTED   Staphylococcus aureus (BCID) NOT DETECTED NOT DETECTED   Streptococcus species DETECTED (A) NOT DETECTED   Streptococcus agalactiae DETECTED (A) NOT DETECTED   Streptococcus pneumoniae NOT DETECTED NOT DETECTED   Streptococcus pyogenes NOT DETECTED NOT DETECTED   Acinetobacter baumannii NOT DETECTED NOT DETECTED   Enterobacteriaceae species NOT DETECTED NOT DETECTED   Enterobacter cloacae complex NOT DETECTED NOT DETECTED   Escherichia coli NOT DETECTED NOT DETECTED   Klebsiella oxytoca NOT DETECTED NOT DETECTED   Klebsiella pneumoniae NOT DETECTED NOT DETECTED   Proteus species NOT DETECTED NOT DETECTED   Serratia marcescens NOT DETECTED NOT DETECTED   Haemophilus influenzae NOT DETECTED NOT DETECTED   Neisseria meningitidis NOT DETECTED NOT DETECTED   Pseudomonas aeruginosa NOT DETECTED NOT DETECTED   Candida albicans NOT DETECTED NOT DETECTED   Candida glabrata NOT DETECTED NOT DETECTED   Candida krusei NOT DETECTED NOT DETECTED   Candida  parapsilosis NOT DETECTED NOT DETECTED   Candida tropicalis NOT DETECTED NOT [place], PharmD, BCPS Clinical Pharmacist 01/05/2019 2:47 PM

## 2019-01-05 NOTE — ED Notes (Signed)
Report given to Joshua, RN

## 2019-01-05 NOTE — ED Provider Notes (Signed)
[facility] Emergency Department Provider Note  ____________________________________________   First MD Initiated Contact with Patient 01/05/19 0009     (approximate)  I have reviewed the triage vital signs and the nursing notes.   HISTORY  Chief Complaint Fever    HPI [name] is a 68 y.o. female  With history as below including MS, COPD, here with weakness, nausea. Pt reports that for the past week, she's had progressively worsening nausea, weakness. Shes noticed poor appetite as well. She states she has noticed increasing dark, foul-smelling urine via her foley over this same time period. Pt reports that over the past 24 hours, she's had chills, fever, and worsenign fatigue. She could not even stand up due to weakness at home. She has not had any cough but has had some mild SOB. No known sick contacts of COVID exposures. No diarrhea. + nausea.    Past Medical History:  Diagnosis Date  . Abdominal aortic atherosclerosis ([place]) 11/11/2016  . ADHD   . Anxiety   . COPD (chronic obstructive pulmonary disease) ([place])   . Depression    major depressive  . Dyspnea    doe  . Edema    left leg  . Follicular lymphoma ([place])    B Cell  . Follicular lymphoma grade II ([place])   . Hypertension   . Hypotension    idiopathic  . Kyphoscoliosis and scoliosis 11/26/2011  . Morbid obesity ([place]) 01/05/2016  . Multiple sclerosis ([place])   . Multiple sclerosis ([place])    1980's  . Neuromuscular disorder ([place])   . Obstructive and reflux uropathy    foley  . Pain    atypical facial  . Peripheral vascular disease of lower extremity with ulceration ([place]) 11/08/2015  . Skin ulcer ([place]) 11/08/2015  . Weakness    generalized. has MS    Patient Active Problem List   Diagnosis Date Noted  . UTI (urinary tract infection) 06/13/2018  . Altered mental status 06/11/2018  . Fall 05/13/2018  . Depression 05/13/2018  . Recurrent cellulitis of lower extremity 06/25/2017  .  Medication monitoring encounter 06/05/2017  . Cellulitis of left lower leg 04/25/2017  . Adjustment disorder with mixed disturbance of emotions and conduct 04/23/2017  . Foot pain, bilateral 02/03/2017  . Tinea pedis 02/03/2017  . Left ovarian cyst 12/09/2016  . Abdominal aortic atherosclerosis ([place]) 11/11/2016  . Swelling of lower extremity 10/11/2016  . Obstructive sleep apnea 10/11/2016  . Follicular lymphoma of intra-abdominal lymph nodes ([place]) 08/06/2016  . Multiple falls 06/07/2016  . Inguinal adenopathy 05/29/2016  . Obesity, morbid ([place]) 01/05/2016  . Low HDL (under 40) 12/19/2015  . Skin ulcer ([place]) 11/08/2015  . Peripheral vascular disease of lower extremity with ulceration ([place]) 11/08/2015  . CKD (chronic kidney disease) stage 3, GFR 30-59 ml/min (H[facility]) 11/08/2015  . Constipation due to pain medication 01/31/2015  . Obstructive sleep apnea of adult 01/13/2015  . Pelvic muscle wasting 01/13/2015  . Incomplete bladder emptying 01/13/2015  . Headache, migraine 10/24/2014  . 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 Current tobacco use 10/24/2014  . Lumbar radiculopathy, chronic 10/02/2013  . COPD with bronchial hyperresponsiveness ([place]) 10/02/2013  . Major depressive disorder, recurrent, in partial remission ([place]) 10/02/2013  . Hypertension goal BP (blood pressure) < 140/90 10/02/2013  . Multiple sclerosis ([place]) 10/02/2013  . Absence of bladder continence 09/25/2012  . Acontractile bladder 02/12/2012  . Narrowing of intervertebral disc space 11/26/2011  . Kyphoscoliosis and scoliosis 11/26/2011  Past Surgical History:  Procedure Laterality Date  . BACK SURGERY N/A 2002  . CYST EXCISION     lower back  . INGUINAL LYMPH NODE BIOPSY Left 07/04/2016   Procedure: INGUINAL LYMPH NODE BIOPSY;  Surgeon: Christene Lye, MD;  Location: [facility] ORS;  Service: General;  Laterality: Left;  . PORTACATH PLACEMENT N/A 07/22/2016   Procedure: INSERTION PORT-A-CATH;  Surgeon: Christene Lye, MD;  Location: [facility] ORS;  Service: General;  Laterality: N/A;  . TONSILLECTOMY AND ADENOIDECTOMY    . TUBAL LIGATION      Prior to Admission medications   Medication Sig Start Date End Date Taking? Authorizing Provider  atorvastatin (LIPITOR) 10 MG tablet Take 1 tablet (10 mg total) by mouth at bedtime. Patient not taking: Reported on 06/11/2018 02/04/18   Arnetha Courser, MD  BIOTIN PO Take by mouth daily. Pt taking 500 mcg daily    [provider]  buPROPion (WELLBUTRIN XL) 300 MG 24 hr tablet Take 300 mg by mouth every morning.  05/28/16   [provider]  clonazePAM (KLONOPIN) 1 MG tablet Take 0.5 tablets (0.5 mg total) by mouth at bedtime. 06/14/18   Demetrios Loll, MD  DULoxetine (CYMBALTA) 60 MG capsule Take 1 capsule (60 mg total) by mouth every morning. 10/14/16   Vaughan Basta, MD  gabapentin (NEURONTIN) 600 MG tablet Take 1 tablet (600 mg total) by mouth 3 (three) times daily. 02/09/17   Bettey Costa, MD  interferon beta-1a (AVONEX) 30 MCG/0.5ML PSKT injection Inject 30 mcg into the muscle every Monday.    [provider]  levofloxacin (LEVAQUIN) 500 MG tablet Take 1 tablet (500 mg total) by mouth daily. 06/14/18   Demetrios Loll, MD  methylphenidate (RITALIN) 10 MG tablet Take 1 tablet (10 mg total) by mouth 2 (two) times daily. 07/08/17   Bettey Costa, MD  Multiple Vitamin (MULTIVITAMIN WITH MINERALS) TABS tablet Take 1 tablet by mouth every evening.     [provider]  MYRBETRIQ 50 MG TB24 tablet TAKE ONE TABLET BY MOUTH ONCE DAILY (FOR OVERACTIVE BLADDER) 12/03/18   Stoioff, Ronda Fairly, MD  polyethylene glycol powder (GLYCOLAX/MIRALAX) powder Take 17 g by mouth daily as needed for mild constipation. Mixed in water Patient taking differently: Take 17 g by mouth daily as needed for mild constipation. For constipation 01/06/17   Sudini, Alveta Heimlich, MD  potassium chloride (KLOR-CON 10) 10 MEQ tablet Take 1 tablet (10 mEq total) by mouth daily. 02/02/18    Arnetha Courser, MD  QUEtiapine Fumarate (SEROQUEL XR) 150 MG 24 hr tablet Take 150 mg by mouth at bedtime.     [provider]  vitamin C (ASCORBIC ACID) 500 MG tablet Take 500 mg by mouth every evening.     [provider]    Allergies Patient has no known allergies.  Family History  Problem Relation Age of Onset  . COPD Mother   . Diabetes Mother   . Heart failure Mother   . Alcohol abuse Father   . Kidney disease Father   . Kidney failure Father   . Arthritis Sister   . CAD Maternal Grandmother   . Stroke Maternal Grandfather   . Arthritis Sister   . Mental illness Sister   . Arthritis Brother     Social History Social History   Tobacco Use  . Smoking status: Former Smoker    Packs/day: 1.00    Years: 20.00    Pack years: 20.00    Types: Cigarettes  Start date: 04/30/1995    Quit date: 02/03/2016    Years since quitting: 2.9  . Smokeless tobacco: Never Used  Substance Use Topics  . Alcohol use: No    Alcohol/week: 0.0 standard drinks  . Drug use: Yes    Types: Marijuana    Comment: smokes THC occasionally per pt     Review of Systems  Review of Systems  Constitutional: Positive for fatigue.  Respiratory: Positive for shortness of breath.   Gastrointestinal: Positive for nausea.  Genitourinary: Positive for dysuria and flank pain.  Neurological: Positive for weakness.  All other systems reviewed and are negative.    ____________________________________________  PHYSICAL EXAM:      VITAL SIGNS: ED Triage Vitals  Enc Vitals Group     BP 01/04/19 2324 107/73     Pulse Rate 01/04/19 2324 (!) 103     Resp 01/04/19 2324 (!) 24     Temp 01/04/19 2324 (!) 103.3 F (39.6 C)     Temp Source 01/04/19 2324 Oral     SpO2 01/04/19 2322 96 %     Weight 01/04/19 2330 (!) 325 lb (147.4 kg)     Height 01/04/19 2330 5\' 6"  (1.676 m)     Head Circumference --      Peak Flow --      Pain Score 01/04/19 2330 9     Pain Loc --      Pain Edu? --       Excl. in [place]? --      Physical Exam Vitals signs and nursing note reviewed.  Constitutional:      General: She is not in acute distress.    Appearance: She is well-developed. She is ill-appearing.  HENT:     Head: Normocephalic and atraumatic.     Mouth/Throat:     Mouth: Mucous membranes are dry.  Eyes:     Conjunctiva/sclera: Conjunctivae normal.  Neck:     Musculoskeletal: Neck supple.  Cardiovascular:     Rate and Rhythm: Regular rhythm. Tachycardia present.     Heart sounds: Normal heart sounds. No murmur. No friction rub.  Pulmonary:     Effort: Pulmonary effort is normal. No respiratory distress.     Breath sounds: Wheezing present. No rales.  Abdominal:     General: Abdomen is flat. There is distension.     Palpations: Abdomen is soft.     Tenderness: There is abdominal tenderness.  Skin:    General: Skin is warm.     Capillary Refill: Capillary refill takes less than 2 seconds.     Findings: No rash.  Neurological:     Mental Status: She is alert and oriented to person, place, and time.     Motor: No abnormal muscle tone.       ____________________________________________   LABS (all labs ordered are listed, but only abnormal results are displayed)  Labs Reviewed  LACTIC ACID, PLASMA - Abnormal; Notable for the following components:      Result Value   Lactic Acid, Venous 2.8 (*)    All other components within normal limits  COMPREHENSIVE METABOLIC PANEL - Abnormal; Notable for the following components:   Glucose, Bld 125 (*)    BUN 27 (*)    Creatinine, Ser 1.84 (*)    GFR calc non Af Amer 28 (*)    GFR calc Af Amer 32 (*)    All other components within normal limits  CBC WITH DIFFERENTIAL/PLATELET - Abnormal; Notable for the following  components:   WBC 15.6 (*)    Neutro Abs 14.1 (*)    Lymphs Abs 0.3 (*)    All other components within normal limits  APTT - Abnormal; Notable for the following components:   aPTT 38 (*)    All other  components within normal limits  URINALYSIS, ROUTINE W REFLEX MICROSCOPIC - Abnormal; Notable for the following components:   Color, Urine AMBER (*)    APPearance CLOUDY (*)    Hgb urine dipstick SMALL (*)    Protein, ur 100 (*)    Leukocytes,Ua LARGE (*)    RBC / HPF >50 (*)    WBC, UA >50 (*)    Bacteria, UA FEW (*)    All other components within normal limits  SARS CORONAVIRUS 2 (HOSPITAL ORDER, [place] LAB)  CULTURE, BLOOD (ROUTINE X 2)  CULTURE, BLOOD (ROUTINE X 2)  URINE CULTURE  LACTIC ACID, PLASMA  PROTIME-INR    ____________________________________________  EKG: Sinus tachycardia, VR 106. Inferior infarct, old. No acute ST-t segment changes.  ________________________________________  RADIOLOGY All imaging, including plain films, CT scans, and ultrasounds, independently reviewed by me, and interpretations confirmed via formal radiology reads.  ED MD interpretation:   CXR: Neg CT A/P: gaseous distension of colon, no bowel obstruction; incidental cystic mass ovary - will need f/u  Official radiology report(s): Ct Abdomen Pelvis Wo Contrast  Result Date: 01/05/2019 CLINICAL DATA:  Fever, chills, abdominal distention, nausea, vomiting EXAM: CT ABDOMEN AND PELVIS WITHOUT CONTRAST TECHNIQUE: Multidetector CT imaging of the abdomen and pelvis was performed following the standard protocol without IV contrast. COMPARISON:  11/05/2016 FINDINGS: Lower chest: Lung bases are clear. No effusions. Heart is normal size. Hepatobiliary: No focal hepatic abnormality. Gallbladder unremarkable. Pancreas: No focal abnormality or ductal dilatation. Spleen: No focal abnormality.  Normal size. Adrenals/Urinary Tract: Foley catheter within the bladder. No hydronephrosis. Adrenal glands unremarkable. Stomach/Bowel: Gaseous distension of the transverse colon with moderate stool burden. Small bowel decompressed. Stomach unremarkable. Vascular/Lymphatic: Aortic atherosclerosis.  No evidence of aneurysm or adenopathy. Reproductive: Left ovarian cystic mass again noted measuring 6.4 x 4.9 cm compared to 4.9 x 4.3 cm previously. No right adnexal mass. Other: No free fluid or free air. Musculoskeletal: No acute bony abnormality. Postoperative changes in the lumbar spine. Severe degenerative disc disease throughout the lumbar spine. Moderate compression deformity at multiple levels in the lumbar spine are stable. IMPRESSION: Gaseous distention of the transverse colon. No evidence of bowel obstruction. This may reflect mild ileus. Aortic atherosclerosis. Enlarging left ovarian cystic mass now measuring up to 6.4 cm compared to 4.9 cm previously. This could be further characterized with elective pelvic ultrasound or MRI. Electronically Signed   By: Rolm Baptise M.D.   On: 01/05/2019 02:20   Dg Chest Port 1 View  Result Date: 01/04/2019 CLINICAL DATA:  Fever EXAM: PORTABLE CHEST 1 VIEW COMPARISON:  05/13/2018 FINDINGS: Heart and mediastinal contours are within normal limits. No focal opacities or effusions. No acute bony abnormality. IMPRESSION: No active disease. Electronically Signed   By: Rolm Baptise M.D.   On: 01/04/2019 23:58    ____________________________________________  PROCEDURES   Procedure(s) performed (including Critical Care):  .Critical Care Performed by: Duffy Bruce, MD Authorized by: Duffy Bruce, MD   Critical care provider statement:    Critical care time (minutes):  35   Critical care time was exclusive of:  Separately billable procedures and treating other patients and teaching time   Critical care was necessary to treat  or prevent imminent or life-threatening deterioration of the following conditions:  Cardiac failure, circulatory failure and sepsis   Critical care was time spent personally by me on the following activities:  Development of treatment plan with patient or surrogate, discussions with consultants, evaluation of patient's response to  treatment, examination of patient, obtaining history from patient or surrogate, ordering and performing treatments and interventions, ordering and review of laboratory studies, ordering and review of radiographic studies, pulse oximetry, re-evaluation of patient's condition and review of old charts   I assumed direction of critical care for this patient from another provider in my specialty: no      ____________________________________________  INITIAL IMPRESSION / MDM / [place] / ED COURSE  As part of my medical decision making, I reviewed the following data within the electronic MEDICAL RECORD NUMBER Notes from prior ED visits and [place] Controlled Substance Database      *AURBREE EURE was evaluated in Emergency Department on 01/05/2019 for the symptoms described in the history of present illness. She was evaluated in the context of the global COVID-19 pandemic, which necessitated consideration that the patient might be at risk for infection with the SARS-CoV-2 virus that causes COVID-19. Institutional protocols and algorithms that pertain to the evaluation of patients at risk for COVID-19 are in a state of rapid change based on information released by regulatory bodies including the CDC and federal and state organizations. These policies and algorithms were followed during the patient's care in the ED.  Some ED evaluations and interventions may be delayed as a result of limited staffing during the pandemic.*   Clinical Course as of Jan 04 257  Tue Jan 05, 2019  0219 68 yo F here with fever, weakness, nausea, darkened urine. Suspect sepsis 2/2 ca-UTI. Labs show leukocytosis, lactic acidosis, and mild AKI. UA is c/w UTI, though indwelling foley makes interpretation somewhat more difficulty. Pt also incidentally hypoxic. No cough, CXR clear. Will give supplemental O2. Pt also with mod abd distension on exam - given that her sensation is somewhat decreased, will check CT.   [CI]  0257 CT w/o  acute surgical abnormality. HR improving w/ fluids. Repeat LA 1.7. Admit for sepsis 2/2 CA-UTI.   [CI]    Clinical Course User Index [CI] Duffy Bruce, MD    Medical Decision Making: as above.   ____________________________________________  FINAL CLINICAL IMPRESSION(S) / ED DIAGNOSES  Final diagnoses:  AKI (acute kidney injury) ([place])  Sepsis secondary to UTI [facility])     MEDICATIONS GIVEN DURING THIS VISIT:  Medications  ceFEPIme (MAXIPIME) 2 g in sodium chloride 0.9 % 100 mL IVPB (0 g Intravenous Stopped 01/05/19 0135)  metroNIDAZOLE (FLAGYL) IVPB 500 mg (0 mg Intravenous Stopped 01/05/19 0135)  vancomycin (VANCOCIN) IVPB 1000 mg/200 mL premix (0 mg Intravenous Stopped 01/05/19 0234)  sodium chloride 0.9 % bolus 1,000 mL (0 mLs Intravenous Stopped 01/05/19 0137)  sodium chloride 0.9 % bolus 1,000 mL (0 mLs Intravenous Stopped 01/05/19 0234)  acetaminophen (TYLENOL) tablet 1,000 mg (1,000 mg Oral Given 01/05/19 0136)  lactated ringers bolus 1,000 mL (0 mLs Intravenous Stopped 01/05/19 0234)  ipratropium-albuterol (DUONEB) 0.5-2.5 (3) MG/3ML nebulizer solution 3 mL (3 mLs Nebulization Given 01/05/19 0232)  HYDROmorphone (DILAUDID) injection 0.5 mg (0.5 mg Intravenous Given 01/05/19 0232)  ondansetron (ZOFRAN) injection 4 mg (4 mg Intravenous Given 01/05/19 0232)     ED Discharge Orders    None       Note:  This document was prepared using Dragon  voice recognition software and may include unintentional dictation errors.   Duffy Bruce, MD 01/05/19 [phone]

## 2019-01-05 NOTE — Progress Notes (Signed)
CODE SEPSIS - PHARMACY COMMUNICATION  **Broad Spectrum Antibiotics should be administered within 1 hour of Sepsis diagnosis**  Time Code Sepsis Called/Page Received: 23:36  Antibiotics Ordered: Vancomycin, Cefepime, Metronidazole  Time of 1st antibiotic administration: Cefepime given at 00:29  Additional action taken by pharmacy: n/a  If necessary, Name of Provider/Nurse Contacted: n/a    Vira Blanco ,PharmD Clinical Pharmacist  01/05/2019  12:34 AM

## 2019-01-05 NOTE — Progress Notes (Addendum)
[place] [facility] [place] NAME: [name]    MR#:  [id number]  DATE OF BIRTH:  Sep 26, 1950  SUBJECTIVE:  CHIEF COMPLAINT: Patient is resting comfortably.  Denies any abdominal pain.  Denies any nausea vomiting tolerating diet  REVIEW OF SYSTEMS:  CONSTITUTIONAL: No fever, fatigue or weakness.  EYES: No blurred or double vision.  EARS, NOSE, AND THROAT: No tinnitus or ear pain.  RESPIRATORY: No cough, shortness of breath, wheezing or hemoptysis.  CARDIOVASCULAR: No chest pain, orthopnea, edema.  GASTROINTESTINAL: No nausea, vomiting, diarrhea or abdominal pain.  GENITOURINARY: No dysuria, hematuria.  ENDOCRINE: No polyuria, nocturia,  HEMATOLOGY: No anemia, easy bruising or bleeding SKIN: No rash or lesion. MUSCULOSKELETAL: No joint pain or arthritis.   NEUROLOGIC: No tingling, numbness, weakness.  PSYCHIATRY: No anxiety or depression.   DRUG ALLERGIES:  No Known Allergies  VITALS:  Blood pressure (!) 154/68, pulse 69, temperature 97.8 F (36.6 C), temperature source Oral, resp. rate 14, height 5\' 6"  (1.676 m), weight (!) 147.4 kg, SpO2 99 %.  PHYSICAL EXAMINATION:  GENERAL:  68 y.o.-year-old patient lying in the bed with no acute distress.  EYES: Pupils equal, round, reactive to light and accommodation. No scleral icterus. Extraocular muscles intact.  HEENT: Head atraumatic, normocephalic. Oropharynx and nasopharynx clear.  NECK:  Supple, no jugular venous distention. No thyroid enlargement, no tenderness.  LUNGS: Normal breath sounds bilaterally, no wheezing, rales,rhonchi or crepitation. No use of accessory muscles of respiration.  CARDIOVASCULAR: S1, S2 normal. No murmurs, rubs, or gallops.  ABDOMEN: Soft, nontender, nondistended. Bowel sounds present.  EXTREMITIES: No pedal edema, cyanosis, or clubbing.  NEUROLOGIC: Cranial nerves II through XII are intact. Muscle strength 5/5 in all extremities. Sensation intact. Gait not  checked.  PSYCHIATRIC: The patient is alert and oriented x 3.  SKIN: No obvious rash, lesion, or ulcer.    LABORATORY PANEL:   CBC Recent Labs  Lab 01/04/19 2338  WBC 15.6*  HGB 12.2  HCT 38.0  PLT 265   ------------------------------------------------------------------------------------------------------------------  Chemistries  Recent Labs  Lab 01/04/19 2338  NA 136  K 4.5  CL 100  CO2 23  GLUCOSE 125*  BUN 27*  CREATININE 1.84*  CALCIUM 9.4  AST 23  ALT 22  ALKPHOS 82  BILITOT 0.5   ------------------------------------------------------------------------------------------------------------------  Cardiac Enzymes No results for input(s): TROPONINI in the last 168 hours. ------------------------------------------------------------------------------------------------------------------  RADIOLOGY:  Ct Abdomen Pelvis Wo Contrast  Result Date: 01/05/2019 CLINICAL DATA:  Fever, chills, abdominal distention, nausea, vomiting EXAM: CT ABDOMEN AND PELVIS WITHOUT CONTRAST TECHNIQUE: Multidetector CT imaging of the abdomen and pelvis was performed following the standard protocol without IV contrast. COMPARISON:  11/05/2016 FINDINGS: Lower chest: Lung bases are clear. No effusions. Heart is normal size. Hepatobiliary: No focal hepatic abnormality. Gallbladder unremarkable. Pancreas: No focal abnormality or ductal dilatation. Spleen: No focal abnormality.  Normal size. Adrenals/Urinary Tract: Foley catheter within the bladder. No hydronephrosis. Adrenal glands unremarkable. Stomach/Bowel: Gaseous distension of the transverse colon with moderate stool burden. Small bowel decompressed. Stomach unremarkable. Vascular/Lymphatic: Aortic atherosclerosis. No evidence of aneurysm or adenopathy. Reproductive: Left ovarian cystic mass again noted measuring 6.4 x 4.9 cm compared to 4.9 x 4.3 cm previously. No right adnexal mass. Other: No free fluid or free air. Musculoskeletal: No acute bony  abnormality. Postoperative changes in the lumbar spine. Severe degenerative disc disease throughout the lumbar spine. Moderate compression deformity at multiple levels in the lumbar spine are stable. IMPRESSION: Gaseous distention of the  transverse colon. No evidence of bowel obstruction. This may reflect mild ileus. Aortic atherosclerosis. Enlarging left ovarian cystic mass now measuring up to 6.4 cm compared to 4.9 cm previously. This could be further characterized with elective pelvic ultrasound or MRI. Electronically Signed   By: Rolm Baptise M.D.   On: 01/05/2019 02:20   Dg Chest Port 1 View  Result Date: 01/04/2019 CLINICAL DATA:  Fever EXAM: PORTABLE CHEST 1 VIEW COMPARISON:  05/13/2018 FINDINGS: Heart and mediastinal contours are within normal limits. No focal opacities or effusions. No acute bony abnormality. IMPRESSION: No active disease. Electronically Signed   By: Rolm Baptise M.D.   On: 01/04/2019 23:58    EKG:   Orders placed or performed during the hospital encounter of 01/04/19  . ED EKG 12-Lead  . ED EKG 12-Lead  . ED EKG 12-Lead  . ED EKG 12-Lead  . EKG 12-Lead  . EKG 12-Lead    ASSESSMENT AND PLAN:     1.    Sepsis present at the time of admission the patient meets criteria for sepsis via tachycardia, leukocytosis and fever.  She is hemodynamically stable.  Follow blood cultures as well as urine cultures for growth and sensitivities.  Source appears to be urine. Continue IV antibiotics Zosyn  2.  UTI: Present on admission.  Is an indwelling Foley catheter.  We will continue Zosyn based on previous culture results and sensitivities.  3.  Acute kidney injury: Hydrate with intravenous fluid.  Avoid nephrotoxic agents.  Check a.m. labs  4.  Hypertension: Labile; pharmacy to review medications for reconciliation.  5.  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 Janese Banks as an outpatient CT abdomen with increased size of the left ovarian  cystic mass from 4.9 to 6.4 cm with Dr. Janese Banks consulted.  Patient to follow-up with GYN and [facility]PAT[facility]  .  DVT prophylaxis: Heparin   GI prophylaxis: None  All the records are reviewed and case discussed with Care Management/Social Workerr. Management plans discussed with the patient, family and they are in agreement.  CODE STATUS: fc   TOTAL TIME TAKING CARE OF THIS PATIENT: 35  minutes.   POSSIBLE D/C IN 2  DAYS, DEPENDING ON CLINICAL CONDITION.  Note: This dictation was prepared with Dragon dictation along with smaller phrase technology. Any transcriptional errors that result from this process are unintentional.   Nicholes Mango M.D on 01/05/2019 at 1:35 PM  Between 7am to 6pm - Pager - [phone] After 6pm go to [email or web address] - password [email or web address] [facility]  [place] [facility]  Office  [phone]  CC: Primary care physician; Kirk Ruths, MD

## 2019-01-05 NOTE — Progress Notes (Signed)
PHARMACY -  BRIEF ANTIBIOTIC NOTE   Pharmacy has received consult(s) for Vancomycin and Cefepime from an ED provider.  The patient's profile has been reviewed for ht/wt/allergies/indication/available labs.    One time order(s) placed for Vancomycin and Cefepime by ED provider  Further antibiotics/pharmacy consults should be ordered by admitting physician if indicated.                       Thank you, Vira Blanco 01/05/2019  12:20 AM

## 2019-01-05 NOTE — ED Notes (Signed)
ED TO INPATIENT HANDOFF REPORT  ED Nurse Name and Phone #: Janett Billow [phone]   S Name/Age/Gender [name] 68 y.o. female Room/Bed: ED14A/ED14A  Code Status   Code Status: Full Code  Home/SNF/Other Home Patient oriented to: self, place, time and situation Is this baseline? Yes   Triage Complete: Triage complete  Chief Complaint Poss UTI  Triage Note Pt arrived from home via EMS with c/o fever, chills and weakness. Pt has an indewelling urinary cath and a hx of MS. Pt statrting feeling bad yesterday   Allergies No Known Allergies  Level of Care/Admitting Diagnosis ED Disposition    ED Disposition Condition [facility] Area: [place] [[id number]]  Level of Care: Med-Surg [16]  Covid Evaluation: Asymptomatic Screening Protocol (No Symptoms)  [facility]: [id number]  Admitting Physician: Harrie Foreman [id number]  Attending Physician: Harrie Foreman [id number]  Estimated length of stay: past midnight tomorrow  Certification:: I certify this patient will need inpatient services for at least 2 midnights  PT Class (Do Not Modify): Inpatient [101]  PT Acc Code (Do Not Modify): Private [1]       B Medical/Surgery History Past Medical History:  Diagnosis Date  . Abdominal aortic atherosclerosis ([place]) 11/11/2016  . ADHD   . Anxiety   . COPD (chronic obstructive pulmonary disease) ([place])   . Depression    major depressive  . Dyspnea    doe  . Edema    left leg  . Follicular lymphoma ([place])    B Cell  . Follicular lymphoma grade II ([place])   . Hypertension   . Hypotension    idiopathic  . Kyphoscoliosis and scoliosis 11/26/2011  . Morbid obesity ([employer]) 01/05/2016  . Multiple sclerosis ([place])   . Multiple sclerosis ([place])    1980's  . Neuromuscular disorder ([place])   . Obstructive and reflux uropathy    foley  . Pain    atypical facial  . Peripheral vascular disease of lower extremity with ulceration ([place]) 11/08/2015   . Skin ulcer ([place]) 11/08/2015  . Weakness    generalized. has MS   Past Surgical History:  Procedure Laterality Date  . BACK SURGERY N/A 2002  . CYST EXCISION     lower back  . INGUINAL LYMPH NODE BIOPSY Left 07/04/2016   Procedure: INGUINAL LYMPH NODE BIOPSY;  Surgeon: Christene Lye, MD;  Location: [facility] ORS;  Service: General;  Laterality: Left;  . PORTACATH PLACEMENT N/A 07/22/2016   Procedure: INSERTION PORT-A-CATH;  Surgeon: Christene Lye, MD;  Location: [facility] ORS;  Service: General;  Laterality: N/A;  . TONSILLECTOMY AND ADENOIDECTOMY    . TUBAL LIGATION       A IV Location/Drains/Wounds Patient Lines/Drains/Airways Status   Active Line/Drains/Airways    Name:   Placement date:   Placement time:   Site:   Days:   Peripheral IV 01/04/19 Right Antecubital   01/04/19    2330    Antecubital   1   Peripheral IV 01/04/19 Left Antecubital   01/04/19    2340    Antecubital   1          Intake/Output Last 24 hours  Intake/Output Summary (Last 24 hours) at 01/05/2019 1436 Last data filed at 01/05/2019 1030 Gross per 24 hour  Intake 28.86 ml  Output -  Net 28.86 ml    Labs/Imaging Results for orders placed or performed during the hospital encounter of 01/04/19 (from the past 48 hour(s))  Lactic acid, plasma     Status: Abnormal   Collection Time: 01/04/19 11:38 PM  Result Value Ref Range   Lactic Acid, Venous 2.8 (HH) 0.5 - 1.9 mmol/L    Comment: CRITICAL RESULT CALLED TO, READ BACK BY AND VERIFIED WITH JEN DALEY @021  01/05/19 MJU Performed at [facility], [address] [place], [place] [address]   Comprehensive metabolic panel     Status: Abnormal   Collection Time: 01/04/19 11:38 PM  Result Value Ref Range   Sodium 136 135 - 145 mmol/L   Potassium 4.5 3.5 - 5.1 mmol/L   Chloride 100 98 - 111 mmol/L   CO2 23 22 - 32 mmol/L   Glucose, Bld 125 (H) 70 - 99 mg/dL   BUN 27 (H) 8 - 23 mg/dL   Creatinine, Ser 1.84 (H) 0.44 - 1.00 mg/dL   Calcium 9.4  8.9 - 10.3 mg/dL   Total Protein 8.1 6.5 - 8.1 g/dL   Albumin 4.4 3.5 - 5.0 g/dL   AST 23 15 - 41 U/L   ALT 22 0 - 44 U/L   Alkaline Phosphatase 82 38 - 126 U/L   Total Bilirubin 0.5 0.3 - 1.2 mg/dL   GFR calc non Af Amer 28 (L) >60 mL/min   GFR calc Af Amer 32 (L) >60 mL/min   Anion gap 13 5 - 15    Comment: Performed at [facility], [address] [place], [place] [address]  CBC WITH DIFFERENTIAL     Status: Abnormal   Collection Time: 01/04/19 11:38 PM  Result Value Ref Range   WBC 15.6 (H) 4.0 - 10.5 K/uL   RBC 4.30 3.87 - 5.11 MIL/uL   Hemoglobin 12.2 12.0 - 15.0 g/dL   HCT 38.0 36.0 - 46.0 %   MCV 88.4 80.0 - 100.0 fL   MCH 28.4 26.0 - 34.0 pg   MCHC 32.1 30.0 - 36.0 g/dL   RDW 13.5 11.5 - 15.5 %   Platelets 265 150 - 400 K/uL   nRBC 0.0 0.0 - 0.2 %   Neutrophils Relative % 91 %   Neutro Abs 14.1 (H) 1.7 - 7.7 K/uL   Lymphocytes Relative 2 %   Lymphs Abs 0.3 (L) 0.7 - 4.0 K/uL   Monocytes Relative 6 %   Monocytes Absolute 1.0 0.1 - 1.0 K/uL   Eosinophils Relative 0 %   Eosinophils Absolute 0.1 0.0 - 0.5 K/uL   Basophils Relative 1 %   Basophils Absolute 0.1 0.0 - 0.1 K/uL   Immature Granulocytes 0 %   Abs Immature Granulocytes 0.06 0.00 - 0.07 K/uL    Comment: Performed at [facility], [address] [place], [place] [address]  APTT     Status: Abnormal   Collection Time: 01/04/19 11:38 PM  Result Value Ref Range   aPTT 38 (H) 24 - 36 seconds    Comment:        IF BASELINE aPTT IS ELEVATED, SUGGEST PATIENT RISK ASSESSMENT BE USED TO DETERMINE APPROPRIATE ANTICOAGULANT THERAPY. Performed at [facility], [address] [place], [place] [address]   Protime-INR     Status: None   Collection Time: 01/04/19 11:38 PM  Result Value Ref Range   Prothrombin Time 13.3 11.4 - 15.2 seconds   INR 1.0 0.8 - 1.2    Comment: (NOTE) INR goal varies based on device and disease states. Performed at [facility], [address], [place], [place] [address]   Blood  Culture (routine x 2)     Status: None (Preliminary result)   Collection Time: 01/04/19 11:38 PM   Specimen: BLOOD  Result Value Ref Range   Specimen Description BLOOD LEFT ANTECUBITAL    Special Requests      BOTTLES DRAWN AEROBIC AND ANAEROBIC Blood Culture adequate volume   Culture      NO GROWTH < 12 HOURS Performed at [facility], [address] [place], [place] [address]    Report Status PENDING   Urinalysis, Routine w reflex microscopic     Status: Abnormal   Collection Time: 01/04/19 11:38 PM  Result Value Ref Range   Color, Urine AMBER (A) YELLOW    Comment: BIOCHEMICALS MAY BE AFFECTED BY COLOR   APPearance CLOUDY (A) CLEAR   Specific Gravity, Urine 1.025 1.005 - 1.030   pH 5.0 5.0 - 8.0   Glucose, UA NEGATIVE NEGATIVE mg/dL   Hgb urine dipstick SMALL (A) NEGATIVE   Bilirubin Urine NEGATIVE NEGATIVE   Ketones, ur NEGATIVE NEGATIVE mg/dL   Protein, ur 100 (A) NEGATIVE mg/dL   Nitrite NEGATIVE NEGATIVE   Leukocytes,Ua LARGE (A) NEGATIVE   RBC / HPF >50 (H) 0 - 5 RBC/hpf   WBC, UA >50 (H) 0 - 5 WBC/hpf   Bacteria, UA FEW (A) NONE SEEN   Squamous Epithelial / LPF 0-5 0 - 5   WBC Clumps PRESENT    Hyaline Casts, UA PRESENT     Comment: Performed at [facility], [address] [place], [place] [address]  TSH     Status: None   Collection Time: 01/04/19 11:38 PM  Result Value Ref Range   TSH 2.477 0.350 - 4.500 uIU/mL    Comment: Performed by a 3rd Generation assay with a functional sensitivity of <=0.01 uIU/mL. Performed at [facility], [address] [place], [place] [address]   Blood Culture (routine x 2)     Status: None (Preliminary result)   Collection Time: 01/04/19 11:40 PM   Specimen: BLOOD  Result Value Ref Range   Specimen Description BLOOD RIGHT ANTECUBITAL    Special Requests      BOTTLES DRAWN AEROBIC AND ANAEROBIC Blood Culture adequate volume   Culture  Setup Time      Organism  ID to follow AEROBIC BOTTLE ONLY GRAM POSITIVE COCCI CRITICAL RESULT CALLED TO, READ BACK BY AND VERIFIED WITH: SHEEMA HALLAJI AT [id number] ON 01/05/2019 JJB Performed at [facility], [address] [place], [place] [address]    Culture GRAM POSITIVE COCCI    Report Status PENDING   Blood Culture ID Panel (Reflexed)     Status: Abnormal   Collection Time: 01/04/19 11:40 PM  Result Value Ref Range   Enterococcus species NOT DETECTED NOT DETECTED   Listeria monocytogenes NOT DETECTED NOT DETECTED   Staphylococcus species NOT DETECTED NOT DETECTED   Staphylococcus aureus (BCID) NOT DETECTED NOT DETECTED   Streptococcus species DETECTED (A) NOT DETECTED    Comment: CRITICAL RESULT CALLED TO, READ BACK BY AND VERIFIED WITH: SHEEMA HALLAJI AT 1432 ON 01/05/2019 JJB    Streptococcus agalactiae DETECTED (A) NOT DETECTED    Comment: CRITICAL RESULT CALLED TO, READ BACK BY AND VERIFIED WITH: SHEEMA HALLAJI AT [id number] ON 01/05/2019 JJB    Streptococcus pneumoniae NOT DETECTED NOT DETECTED   Streptococcus pyogenes NOT DETECTED NOT DETECTED   Acinetobacter baumannii NOT DETECTED NOT DETECTED   Enterobacteriaceae species NOT DETECTED NOT DETECTED   Enterobacter cloacae complex NOT DETECTED NOT  DETECTED   Escherichia coli NOT DETECTED NOT DETECTED   Klebsiella oxytoca NOT DETECTED NOT DETECTED   Klebsiella pneumoniae NOT DETECTED NOT DETECTED   Proteus species NOT DETECTED NOT DETECTED   Serratia marcescens NOT DETECTED NOT DETECTED   Haemophilus influenzae NOT DETECTED NOT DETECTED   Neisseria meningitidis NOT DETECTED NOT DETECTED   Pseudomonas aeruginosa NOT DETECTED NOT DETECTED   Candida albicans NOT DETECTED NOT DETECTED   Candida glabrata NOT DETECTED NOT DETECTED   Candida krusei NOT DETECTED NOT DETECTED   Candida parapsilosis NOT DETECTED NOT DETECTED   Candida tropicalis NOT DETECTED NOT DETECTED    Comment: Performed at [facility], [address] [place], [place]  [address]  SARS Coronavirus 2 [facility] order, Performed in [facility] hospital lab) Nasopharyngeal Nasopharyngeal Swab     Status: None   Collection Time: 01/05/19 12:31 AM   Specimen: Nasopharyngeal Swab  Result Value Ref Range   SARS Coronavirus 2 NEGATIVE NEGATIVE    Comment: (NOTE) If result is NEGATIVE SARS-CoV-2 target nucleic acids are NOT DETECTED. The SARS-CoV-2 RNA is generally detectable in upper and lower  respiratory specimens during the acute phase of infection. The lowest  concentration of SARS-CoV-2 viral copies this assay can detect is 250  copies / mL. A negative result does not preclude SARS-CoV-2 infection  and should not be used as the sole basis for treatment or other  patient management decisions.  A negative result may occur with  improper specimen collection / handling, submission of specimen other  than nasopharyngeal swab, presence of viral mutation(s) within the  areas targeted by this assay, and inadequate number of viral copies  (<250 copies / mL). A negative result must be combined with clinical  observations, patient history, and epidemiological information. If result is POSITIVE SARS-CoV-2 target nucleic acids are DETECTED. The SARS-CoV-2 RNA is generally detectable in upper and lower  respiratory specimens dur ing the acute phase of infection.  Positive  results are indicative of active infection with SARS-CoV-2.  Clinical  correlation with patient history and other diagnostic information is  necessary to determine patient infection status.  Positive results do  not rule out bacterial infection or co-infection with other viruses. If result is PRESUMPTIVE POSTIVE SARS-CoV-2 nucleic acids MAY BE PRESENT.   A presumptive positive result was obtained on the submitted specimen  and confirmed on repeat testing.  While 2019 novel coronavirus  (SARS-CoV-2) nucleic acids may be present in the submitted sample  additional confirmatory testing may be necessary  for epidemiological  and / or clinical management purposes  to differentiate between  SARS-CoV-2 and other Sarbecovirus currently known to infect humans.  If clinically indicated additional testing with an alternate test  methodology [phone]) is advised. The SARS-CoV-2 RNA is generally  detectable in upper and lower respiratory sp ecimens during the acute  phase of infection. The expected result is Negative. Fact Sheet for Patients:  StrictlyIdeas.no Fact Sheet for Healthcare Providers: BankingDealers.co.za This test is not yet approved or cleared by the [place] FDA and has been authorized for detection and/or diagnosis of SARS-CoV-2 by FDA under an Emergency Use Authorization (EUA).  This EUA will remain in effect (meaning this test can be used) for the duration of the COVID-19 declaration under Section 564(b)(1) of the Act, 21 U.S.C. section 360bbb-3(b)(1), unless the authorization is terminated or revoked sooner. Performed at [facility], [place], [place] [address]   Lactic acid, plasma     Status:  None   Collection Time: 01/05/19  2:10 AM  Result Value Ref Range   Lactic Acid, Venous 1.7 0.5 - 1.9 mmol/L    Comment: Performed at [facility], [address] [place], [place] [address]   Ct Abdomen Pelvis Wo Contrast  Result Date: 01/05/2019 CLINICAL DATA:  Fever, chills, abdominal distention, nausea, vomiting EXAM: CT ABDOMEN AND PELVIS WITHOUT CONTRAST TECHNIQUE: Multidetector CT imaging of the abdomen and pelvis was performed following the standard protocol without IV contrast. COMPARISON:  11/05/2016 FINDINGS: Lower chest: Lung bases are clear. No effusions. Heart is normal size. Hepatobiliary: No focal hepatic abnormality. Gallbladder unremarkable. Pancreas: No focal abnormality or ductal dilatation. Spleen: No focal abnormality.  Normal size. Adrenals/Urinary Tract: Foley catheter within  the bladder. No hydronephrosis. Adrenal glands unremarkable. Stomach/Bowel: Gaseous distension of the transverse colon with moderate stool burden. Small bowel decompressed. Stomach unremarkable. Vascular/Lymphatic: Aortic atherosclerosis. No evidence of aneurysm or adenopathy. Reproductive: Left ovarian cystic mass again noted measuring 6.4 x 4.9 cm compared to 4.9 x 4.3 cm previously. No right adnexal mass. Other: No free fluid or free air. Musculoskeletal: No acute bony abnormality. Postoperative changes in the lumbar spine. Severe degenerative disc disease throughout the lumbar spine. Moderate compression deformity at multiple levels in the lumbar spine are stable. IMPRESSION: Gaseous distention of the transverse colon. No evidence of bowel obstruction. This may reflect mild ileus. Aortic atherosclerosis. Enlarging left ovarian cystic mass now measuring up to 6.4 cm compared to 4.9 cm previously. This could be further characterized with elective pelvic ultrasound or MRI. Electronically Signed   By: Rolm Baptise M.D.   On: 01/05/2019 02:20   Dg Chest Port 1 View  Result Date: 01/04/2019 CLINICAL DATA:  Fever EXAM: PORTABLE CHEST 1 VIEW COMPARISON:  05/13/2018 FINDINGS: Heart and mediastinal contours are within normal limits. No focal opacities or effusions. No acute bony abnormality. IMPRESSION: No active disease. Electronically Signed   By: Rolm Baptise M.D.   On: 01/04/2019 23:58    Pending Labs Unresulted Labs (From admission, onward)    Start     Ordered   01/06/19 0500  CBC with Differential/Platelet  Tomorrow morning,   STAT     01/05/19 1339   01/06/19 0500  Comprehensive metabolic panel  Tomorrow morning,   STAT     01/05/19 1339   01/05/19 0648  Hemoglobin A1c  Add-on,   AD     01/05/19 0647   01/04/19 2336  Urine culture  ONCE - STAT,   STAT     01/04/19 2336          Vitals/Pain Today's Vitals   01/05/19 1315 01/05/19 1330 01/05/19 1345 01/05/19 1400  BP:    138/75  Pulse:   77    Resp: 18 17 14 11   Temp:      TempSrc:      SpO2:   100%   Weight:      Height:      PainSc:        Isolation Precautions No active isolations  Medications Medications  mirabegron ER (MYRBETRIQ) tablet 50 mg (50 mg Oral Given 01/05/19 1050)  heparin injection 5,000 Units (5,000 Units Subcutaneous Not Given 01/05/19 0838)  0.9 %  sodium chloride infusion ( Intravenous New Bag/Given 01/05/19 0945)  acetaminophen (TYLENOL) tablet 650 mg (has no administration in time range)    Or  acetaminophen (TYLENOL) suppository 650 mg (has no administration in time range)  docusate sodium (COLACE) capsule 100 mg (100 mg  Oral Not Given 01/05/19 0945)  ondansetron (ZOFRAN) tablet 4 mg (has no administration in time range)    Or  ondansetron (ZOFRAN) injection 4 mg (has no administration in time range)  piperacillin-tazobactam (ZOSYN) IVPB 3.375 g ( Intravenous Stopped 01/05/19 1010)  ceFEPIme (MAXIPIME) 2 g in sodium chloride 0.9 % 100 mL IVPB (0 g Intravenous Stopped 01/05/19 0135)  metroNIDAZOLE (FLAGYL) IVPB 500 mg (0 mg Intravenous Stopped 01/05/19 0135)  vancomycin (VANCOCIN) IVPB 1000 mg/200 mL premix (0 mg Intravenous Stopped 01/05/19 0234)  sodium chloride 0.9 % bolus 1,000 mL (0 mLs Intravenous Stopped 01/05/19 0137)  sodium chloride 0.9 % bolus 1,000 mL (0 mLs Intravenous Stopped 01/05/19 0234)  acetaminophen (TYLENOL) tablet 1,000 mg (1,000 mg Oral Given 01/05/19 0136)  lactated ringers bolus 1,000 mL (0 mLs Intravenous Stopped 01/05/19 0234)  ipratropium-albuterol (DUONEB) 0.5-2.5 (3) MG/3ML nebulizer solution 3 mL (3 mLs Nebulization Given 01/05/19 0232)  HYDROmorphone (DILAUDID) injection 0.5 mg (0.5 mg Intravenous Given 01/05/19 0232)  ondansetron (ZOFRAN) injection 4 mg (4 mg Intravenous Given 01/05/19 0232)    Mobility walks with device Moderate fall risk   Focused Assessments 1   R Recommendations: See Admitting Provider Note  Report given to:   Additional Notes:

## 2019-01-05 NOTE — ED Notes (Signed)
Pt given lunch tray. Pt ate 100% of breakfast tray

## 2019-01-05 NOTE — H&P (Addendum)
[name] is an 68 y.o. female.   Chief Complaint: Fever HPI: The patient with past medical history of multiple sclerosis as well as follicular lymphoma and COPD presents to the emergency department complaining of subjective fever.  She is also had nausea but no vomiting.  She reports that she has been feeling unwell 4 days but that she became acutely weaker yesterday. No travel risk factors or known contact with individuals with exposure to novel coronavirus.  ED work-up significant for urinalysis consistent with infection.  Renal function also slightly worsened.  The patient was given cefepime, vancomycin and metronidazole in the emergency department prior to the hospitalist service being called for admission.  Past Medical History:  Diagnosis Date  . Abdominal aortic atherosclerosis ([place]) 11/11/2016  . ADHD   . Anxiety   . COPD (chronic obstructive pulmonary disease) ([place])   . Depression    major depressive  . Dyspnea    doe  . Edema    left leg  . Follicular lymphoma ([place])    B Cell  . Follicular lymphoma grade II ([place])   . Hypertension   . Hypotension    idiopathic  . Kyphoscoliosis and scoliosis 11/26/2011  . Morbid obesity ([place]) 01/05/2016  . Multiple sclerosis ([employer])   . Multiple sclerosis ([place])    1980's  . Neuromuscular disorder ([place])   . Obstructive and reflux uropathy    foley  . Pain    atypical facial  . Peripheral vascular disease of lower extremity with ulceration ([place]) 11/08/2015  . Skin ulcer ([place]) 11/08/2015  . Weakness    generalized. has MS    Past Surgical History:  Procedure Laterality Date  . BACK SURGERY N/A 2002  . CYST EXCISION     lower back  . INGUINAL LYMPH NODE BIOPSY Left 07/04/2016   Procedure: INGUINAL LYMPH NODE BIOPSY;  Surgeon: Christene Lye, MD;  Location: [facility] ORS;  Service: General;  Laterality: Left;  . PORTACATH PLACEMENT N/A 07/22/2016   Procedure: INSERTION PORT-A-CATH;  Surgeon: Christene Lye, MD;  Location: [facility]  ORS;  Service: General;  Laterality: N/A;  . TONSILLECTOMY AND ADENOIDECTOMY    . TUBAL LIGATION      Family History  Problem Relation Age of Onset  . COPD Mother   . Diabetes Mother   . Heart failure Mother   . Alcohol abuse Father   . Kidney disease Father   . Kidney failure Father   . Arthritis Sister   . CAD Maternal Grandmother   . Stroke Maternal Grandfather   . Arthritis Sister   . Mental illness Sister   . Arthritis Brother    Social History:  reports that she quit smoking about 2 years ago. Her smoking use included cigarettes. She started smoking about 23 years ago. She has a 20.00 pack-year smoking history. She has never used smokeless tobacco. She reports current drug use. Drug: Marijuana. She reports that she does not drink alcohol.  Allergies: No Known Allergies  Prior to Admission medications   Medication Sig Start Date End Date Taking? Authorizing Provider  BIOTIN PO Take by mouth daily. Pt taking 500 mcg daily    [provider]  buPROPion (WELLBUTRIN XL) 300 MG 24 hr tablet Take 300 mg by mouth every morning.  05/28/16   [provider]  clonazePAM (KLONOPIN) 1 MG tablet Take 0.5 tablets (0.5 mg total) by mouth at bedtime. 06/14/18   Demetrios Loll, MD  DULoxetine (CYMBALTA) 60 MG capsule Take 1  capsule (60 mg total) by mouth every morning. 10/14/16   Vaughan Basta, MD  gabapentin (NEURONTIN) 600 MG tablet Take 1 tablet (600 mg total) by mouth 3 (three) times daily. 02/09/17   Bettey Costa, MD  interferon beta-1a (AVONEX) 30 MCG/0.5ML PSKT injection Inject 30 mcg into the muscle every Monday.    [provider]  levofloxacin (LEVAQUIN) 500 MG tablet Take 1 tablet (500 mg total) by mouth daily. 06/14/18   Demetrios Loll, MD  methylphenidate (RITALIN) 10 MG tablet Take 1 tablet (10 mg total) by mouth 2 (two) times daily. 07/08/17   Bettey Costa, MD  Multiple Vitamin (MULTIVITAMIN WITH MINERALS) TABS tablet Take 1 tablet by mouth every evening.      [provider]  MYRBETRIQ 50 MG TB24 tablet TAKE ONE TABLET BY MOUTH ONCE DAILY (FOR OVERACTIVE BLADDER) 12/03/18   Stoioff, Ronda Fairly, MD  polyethylene glycol powder (GLYCOLAX/MIRALAX) powder Take 17 g by mouth daily as needed for mild constipation. Mixed in water Patient taking differently: Take 17 g by mouth daily as needed for mild constipation. For constipation 01/06/17   Sudini, Alveta Heimlich, MD  potassium chloride (KLOR-CON 10) 10 MEQ tablet Take 1 tablet (10 mEq total) by mouth daily. 02/02/18   Arnetha Courser, MD  QUEtiapine Fumarate (SEROQUEL XR) 150 MG 24 hr tablet Take 150 mg by mouth at bedtime.     [provider]  vitamin C (ASCORBIC ACID) 500 MG tablet Take 500 mg by mouth every evening.     [provider]     Results for orders placed or performed during the hospital encounter of 01/04/19 (from the past 48 hour(s))  Lactic acid, plasma     Status: Abnormal   Collection Time: 01/04/19 11:38 PM  Result Value Ref Range   Lactic Acid, Venous 2.8 (HH) 0.5 - 1.9 mmol/L    Comment: CRITICAL RESULT CALLED TO, READ BACK BY AND VERIFIED WITH JEN DALEY @021  01/05/19 MJU Performed at [facility], [address] [place], [place] [address]   Comprehensive metabolic panel     Status: Abnormal   Collection Time: 01/04/19 11:38 PM  Result Value Ref Range   Sodium 136 135 - 145 mmol/L   Potassium 4.5 3.5 - 5.1 mmol/L   Chloride 100 98 - 111 mmol/L   CO2 23 22 - 32 mmol/L   Glucose, Bld 125 (H) 70 - 99 mg/dL   BUN 27 (H) 8 - 23 mg/dL   Creatinine, Ser 1.84 (H) 0.44 - 1.00 mg/dL   Calcium 9.4 8.9 - 10.3 mg/dL   Total Protein 8.1 6.5 - 8.1 g/dL   Albumin 4.4 3.5 - 5.0 g/dL   AST 23 15 - 41 U/L   ALT 22 0 - 44 U/L   Alkaline Phosphatase 82 38 - 126 U/L   Total Bilirubin 0.5 0.3 - 1.2 mg/dL   GFR calc non Af Amer 28 (L) >60 mL/min   GFR calc Af Amer 32 (L) >60 mL/min   Anion gap 13 5 - 15    Comment: Performed at [facility], [address] [place], [place] [address]  CBC WITH DIFFERENTIAL     Status: Abnormal   Collection Time: 01/04/19 11:38 PM  Result Value Ref Range   WBC 15.6 (H) 4.0 - 10.5 K/uL   RBC 4.30 3.87 - 5.11 MIL/uL   Hemoglobin 12.2 12.0 - 15.0 g/dL   HCT 38.0 36.0 - 46.0 %   MCV 88.4 80.0 - 100.0 fL  MCH 28.4 26.0 - 34.0 pg   MCHC 32.1 30.0 - 36.0 g/dL   RDW 13.5 11.5 - 15.5 %   Platelets 265 150 - 400 K/uL   nRBC 0.0 0.0 - 0.2 %   Neutrophils Relative % 91 %   Neutro Abs 14.1 (H) 1.7 - 7.7 K/uL   Lymphocytes Relative 2 %   Lymphs Abs 0.3 (L) 0.7 - 4.0 K/uL   Monocytes Relative 6 %   Monocytes Absolute 1.0 0.1 - 1.0 K/uL   Eosinophils Relative 0 %   Eosinophils Absolute 0.1 0.0 - 0.5 K/uL   Basophils Relative 1 %   Basophils Absolute 0.1 0.0 - 0.1 K/uL   Immature Granulocytes 0 %   Abs Immature Granulocytes 0.06 0.00 - 0.07 K/uL    Comment: Performed at [facility], [address] [place], [place] [address]  APTT     Status: Abnormal   Collection Time: 01/04/19 11:38 PM  Result Value Ref Range   aPTT 38 (H) 24 - 36 seconds    Comment:        IF BASELINE aPTT IS ELEVATED, SUGGEST PATIENT RISK ASSESSMENT BE USED TO DETERMINE APPROPRIATE ANTICOAGULANT THERAPY. Performed at [facility], [address] [place], [place] [address]   Protime-INR     Status: None   Collection Time: 01/04/19 11:38 PM  Result Value Ref Range   Prothrombin Time 13.3 11.4 - 15.2 seconds   INR 1.0 0.8 - 1.2    Comment: (NOTE) INR goal varies based on device and disease states. Performed at [facility], [address] [place], [place] [address]   Blood Culture (routine x 2)     Status: None (Preliminary result)   Collection Time: 01/04/19 11:38 PM   Specimen: BLOOD  Result Value Ref Range   Specimen Description BLOOD LEFT ANTECUBITAL    Special Requests      BOTTLES DRAWN AEROBIC AND ANAEROBIC Blood Culture adequate volume   Culture      NO GROWTH < 12 HOURS Performed at  [facility], [address] [place], [place] [address]    Report Status PENDING   Urinalysis, Routine w reflex microscopic     Status: Abnormal   Collection Time: 01/04/19 11:38 PM  Result Value Ref Range   Color, Urine AMBER (A) YELLOW    Comment: BIOCHEMICALS MAY BE AFFECTED BY COLOR   APPearance CLOUDY (A) CLEAR   Specific Gravity, Urine 1.025 1.005 - 1.030   pH 5.0 5.0 - 8.0   Glucose, UA NEGATIVE NEGATIVE mg/dL   Hgb urine dipstick SMALL (A) NEGATIVE   Bilirubin Urine NEGATIVE NEGATIVE   Ketones, ur NEGATIVE NEGATIVE mg/dL   Protein, ur 100 (A) NEGATIVE mg/dL   Nitrite NEGATIVE NEGATIVE   Leukocytes,Ua LARGE (A) NEGATIVE   RBC / HPF >50 (H) 0 - 5 RBC/hpf   WBC, UA >50 (H) 0 - 5 WBC/hpf   Bacteria, UA FEW (A) NONE SEEN   Squamous Epithelial / LPF 0-5 0 - 5   WBC Clumps PRESENT    Hyaline Casts, UA PRESENT     Comment: Performed at [facility], [address] [place], [place] [address]  Blood Culture (routine x 2)     Status: None (Preliminary result)   Collection Time: 01/04/19 11:40 PM   Specimen: BLOOD  Result Value Ref Range   Specimen Description BLOOD RIGHT ANTECUBITAL    Special Requests      BOTTLES DRAWN AEROBIC AND ANAEROBIC Blood  Culture adequate volume   Culture      NO GROWTH < 12 HOURS Performed at [facility], [address] [place], [place] [address]    Report Status PENDING   SARS Coronavirus 2 [facility] order, Performed in [facility] hospital lab) Nasopharyngeal Nasopharyngeal Swab     Status: None   Collection Time: 01/05/19 12:31 AM   Specimen: Nasopharyngeal Swab  Result Value Ref Range   SARS Coronavirus 2 NEGATIVE NEGATIVE    Comment: (NOTE) If result is NEGATIVE SARS-CoV-2 target nucleic acids are NOT DETECTED. The SARS-CoV-2 RNA is generally detectable in upper and lower  respiratory specimens during the acute phase of infection. The lowest  concentration of SARS-CoV-2 viral copies this assay can detect is  250  copies / mL. A negative result does not preclude SARS-CoV-2 infection  and should not be used as the sole basis for treatment or other  patient management decisions.  A negative result may occur with  improper specimen collection / handling, submission of specimen other  than nasopharyngeal swab, presence of viral mutation(s) within the  areas targeted by this assay, and inadequate number of viral copies  (<250 copies / mL). A negative result must be combined with clinical  observations, patient history, and epidemiological information. If result is POSITIVE SARS-CoV-2 target nucleic acids are DETECTED. The SARS-CoV-2 RNA is generally detectable in upper and lower  respiratory specimens dur ing the acute phase of infection.  Positive  results are indicative of active infection with SARS-CoV-2.  Clinical  correlation with patient history and other diagnostic information is  necessary to determine patient infection status.  Positive results do  not rule out bacterial infection or co-infection with other viruses. If result is PRESUMPTIVE POSTIVE SARS-CoV-2 nucleic acids MAY BE PRESENT.   A presumptive positive result was obtained on the submitted specimen  and confirmed on repeat testing.  While 2019 novel coronavirus  (SARS-CoV-2) nucleic acids may be present in the submitted sample  additional confirmatory testing may be necessary for epidemiological  and / or clinical management purposes  to differentiate between  SARS-CoV-2 and other Sarbecovirus currently known to infect humans.  If clinically indicated additional testing with an alternate test  methodology [phone]) is advised. The SARS-CoV-2 RNA is generally  detectable in upper and lower respiratory sp ecimens during the acute  phase of infection. The expected result is Negative. Fact Sheet for Patients:  StrictlyIdeas.no Fact Sheet for Healthcare  Providers: BankingDealers.co.za This test is not yet approved or cleared by the [place] FDA and has been authorized for detection and/or diagnosis of SARS-CoV-2 by FDA under an Emergency Use Authorization (EUA).  This EUA will remain in effect (meaning this test can be used) for the duration of the COVID-19 declaration under Section 564(b)(1) of the Act, 21 U.S.C. section 360bbb-3(b)(1), unless the authorization is terminated or revoked sooner. Performed at [facility], [address] [place], [place] [address]   Lactic acid, plasma     Status: None   Collection Time: 01/05/19  2:10 AM  Result Value Ref Range   Lactic Acid, Venous 1.7 0.5 - 1.9 mmol/L    Comment: Performed at [facility], [place], [place] [address]   Ct Abdomen Pelvis Wo Contrast  Result Date: 01/05/2019 CLINICAL DATA:  Fever, chills, abdominal distention, nausea, vomiting EXAM: CT ABDOMEN AND PELVIS WITHOUT CONTRAST TECHNIQUE: Multidetector CT imaging of the abdomen and pelvis was performed following the standard protocol without IV contrast. COMPARISON:  11/05/2016 FINDINGS: Lower chest: Lung bases are clear. No effusions. Heart is normal size. Hepatobiliary: No focal hepatic abnormality. Gallbladder unremarkable. Pancreas: No focal abnormality or ductal dilatation. Spleen: No focal abnormality.  Normal size. Adrenals/Urinary Tract: Foley catheter within the bladder. No hydronephrosis. Adrenal glands unremarkable. Stomach/Bowel: Gaseous distension of the transverse colon with moderate stool burden. Small bowel decompressed. Stomach unremarkable. Vascular/Lymphatic: Aortic atherosclerosis. No evidence of aneurysm or adenopathy. Reproductive: Left ovarian cystic mass again noted measuring 6.4 x 4.9 cm compared to 4.9 x 4.3 cm previously. No right adnexal mass. Other: No free fluid or free air. Musculoskeletal: No acute bony abnormality. Postoperative changes in the  lumbar spine. Severe degenerative disc disease throughout the lumbar spine. Moderate compression deformity at multiple levels in the lumbar spine are stable. IMPRESSION: Gaseous distention of the transverse colon. No evidence of bowel obstruction. This may reflect mild ileus. Aortic atherosclerosis. Enlarging left ovarian cystic mass now measuring up to 6.4 cm compared to 4.9 cm previously. This could be further characterized with elective pelvic ultrasound or MRI. Electronically Signed   By: Rolm Baptise M.D.   On: 01/05/2019 02:20   Dg Chest Port 1 View  Result Date: 01/04/2019 CLINICAL DATA:  Fever EXAM: PORTABLE CHEST 1 VIEW COMPARISON:  05/13/2018 FINDINGS: Heart and mediastinal contours are within normal limits. No focal opacities or effusions. No acute bony abnormality. IMPRESSION: No active disease. Electronically Signed   By: Rolm Baptise M.D.   On: 01/04/2019 23:58    Review of Systems  Constitutional: Positive for fever. Negative for chills.  HENT: Negative for sore throat and tinnitus.   Eyes: Negative for blurred vision and redness.  Respiratory: Negative for cough and shortness of breath.   Cardiovascular: Negative for chest pain, palpitations, orthopnea and PND.  Gastrointestinal: Positive for nausea. Negative for abdominal pain, diarrhea and vomiting.  Genitourinary: Negative for dysuria, frequency and urgency.  Musculoskeletal: Negative for joint pain and myalgias.  Skin: Negative for rash.       No lesions  Neurological: Positive for weakness. Negative for speech change and focal weakness.  Endo/Heme/Allergies: Does not bruise/bleed easily.       No temperature intolerance  Psychiatric/Behavioral: Negative for depression and suicidal ideas.    Blood pressure (!) 113/46, pulse 82, temperature 99.1 F (37.3 C), temperature source Oral, resp. rate 10, height 5\' 6"  (1.676 m), weight (!) 147.4 kg, SpO2 98 %. Physical Exam  Vitals reviewed. Constitutional: She is oriented to  person, place, and time. She appears well-developed and well-nourished. No distress.  HENT:  Head: Normocephalic and atraumatic.  Mouth/Throat: Oropharynx is clear and moist.  Eyes: Pupils are equal, round, and reactive to light. Conjunctivae and EOM are normal. No scleral icterus.  Neck: Normal range of motion. Neck supple. No JVD present. No tracheal deviation present. No thyromegaly present.  Cardiovascular: Normal rate, regular rhythm and normal heart sounds. Exam reveals no gallop and no friction rub.  No murmur heard. Respiratory: Effort normal and breath sounds normal.  GI: Soft. Bowel sounds are normal. She exhibits no distension. There is no abdominal tenderness.  Genitourinary:    Genitourinary Comments: Deferred   Musculoskeletal: Normal range of motion.        General: No edema.  Lymphadenopathy:    She has no cervical adenopathy.  Neurological: She is alert and oriented to person, place, and time. No cranial nerve deficit. She exhibits normal muscle tone.  Skin: Skin is warm and dry. No rash noted. No erythema.  Psychiatric: She  has a normal mood and affect. Her behavior is normal. Judgment and thought content normal.     Assessment/Plan This is a 68 year old female admitted for sepsis. 1.  Sepsis: The patient meets criteria for sepsis via tachycardia, leukocytosis and fever.  She is hemodynamically stable.  Follow blood cultures as well as urine cultures for growth and sensitivities.  Source appears to be urine. 2.  UTI: Present on admission.  She has an indwelling Foley catheter.  We will continue Zosyn based on previous culture results and sensitivities. 3.  Acute kidney injury: Hydrate with intravenous fluid.  Avoid nephrotoxic agents. 4.  Hypertension: Labile; pharmacy to review medications for reconciliation. 5.  DVT prophylaxis: Heparin 6.  GI prophylaxis: None The patient is a full code.  Time spent on admission orders and patient care approximately 45  minutes  Harrie Foreman, MD 01/05/2019, 6:48 AM

## 2019-01-05 NOTE — ED Notes (Signed)
Patient sitting up in bed eating breakfast at this time. Denies any pain. No further needs expressed.

## 2019-01-05 NOTE — ED Notes (Signed)
ED TO INPATIENT HANDOFF REPORT  ED Nurse Name and Phone #: Antonieta Pert, RN [phone]  S Name/Age/Gender [name] 68 y.o. female Room/Bed: ED14A/ED14A  Code Status   Code Status: Prior  Home/SNF/Other Home Patient oriented to: self, place, time and situation Is this baseline? Yes   Triage Complete: Triage complete  Chief Complaint Poss UTI  Triage Note Pt arrived from home via EMS with c/o fever, chills and weakness. Pt has an indewelling urinary cath and a hx of MS. Pt statrting feeling bad yesterday   Allergies No Known Allergies  Level of Care/Admitting Diagnosis ED Disposition    ED Disposition Condition [facility]: [place] [[id number]]  Level of Care: Med-Surg [16]  Covid Evaluation: Asymptomatic Screening Protocol (No Symptoms)  [facility]: [id number]  Admitting Physician: Harrie Foreman [id number]  Attending Physician: Harrie Foreman [id number]  Estimated length of stay: past midnight tomorrow  Certification:: I certify this patient will need inpatient services for at least 2 midnights  PT Class (Do Not Modify): Inpatient [101]  PT Acc Code (Do Not Modify): Private [1]       B Medical/Surgery History Past Medical History:  Diagnosis Date  . Abdominal aortic atherosclerosis ([place]) 11/11/2016  . ADHD   . Anxiety   . COPD (chronic obstructive pulmonary disease) ([place])   . Depression    major depressive  . Dyspnea    doe  . Edema    left leg  . Follicular lymphoma ([place])    B Cell  . Follicular lymphoma grade II ([place])   . Hypertension   . Hypotension    idiopathic  . Kyphoscoliosis and scoliosis 11/26/2011  . Morbid obesity ([place]) 01/05/2016  . Multiple sclerosis ([place])   . Multiple sclerosis ([place])    1980's  . Neuromuscular disorder ([place])   . Obstructive and reflux uropathy    foley  . Pain    atypical facial  . Peripheral vascular disease of lower extremity with ulceration ([place])  11/08/2015  . Skin ulcer ([place]) 11/08/2015  . Weakness    generalized. has MS   Past Surgical History:  Procedure Laterality Date  . BACK SURGERY N/A 2002  . CYST EXCISION     lower back  . INGUINAL LYMPH NODE BIOPSY Left 07/04/2016   Procedure: INGUINAL LYMPH NODE BIOPSY;  Surgeon: Christene Lye, MD;  Location: [facility] ORS;  Service: General;  Laterality: Left;  . PORTACATH PLACEMENT N/A 07/22/2016   Procedure: INSERTION PORT-A-CATH;  Surgeon: Christene Lye, MD;  Location: [facility] ORS;  Service: General;  Laterality: N/A;  . TONSILLECTOMY AND ADENOIDECTOMY    . TUBAL LIGATION       A IV Location/Drains/Wounds Patient Lines/Drains/Airways Status   Active Line/Drains/Airways    Name:   Placement date:   Placement time:   Site:   Days:   Peripheral IV 01/04/19 Right Antecubital   01/04/19    2330    Antecubital   1   Peripheral IV 01/04/19 Left Antecubital   01/04/19    2340    Antecubital   1   Urethral Catheter ally Latex 16 Fr.   08/28/17    1148    Latex   495          Intake/Output Last 24 hours No intake or output data in the 24 hours ending 01/05/19 [id number]  Labs/Imaging Results for orders placed or performed during the hospital encounter of 01/04/19 (from the past  48 hour(s))  Lactic acid, plasma     Status: Abnormal   Collection Time: 01/04/19 11:38 PM  Result Value Ref Range   Lactic Acid, Venous 2.8 (HH) 0.5 - 1.9 mmol/L    Comment: CRITICAL RESULT CALLED TO, READ BACK BY AND VERIFIED WITH JEN DALEY @021  01/05/19 MJU Performed at [facility], [address] [place], [place] [address]   Comprehensive metabolic panel     Status: Abnormal   Collection Time: 01/04/19 11:38 PM  Result Value Ref Range   Sodium 136 135 - 145 mmol/L   Potassium 4.5 3.5 - 5.1 mmol/L   Chloride 100 98 - 111 mmol/L   CO2 23 22 - 32 mmol/L   Glucose, Bld 125 (H) 70 - 99 mg/dL   BUN 27 (H) 8 - 23 mg/dL   Creatinine, Ser 1.84 (H) 0.44 - 1.00 mg/dL   Calcium 9.4 8.9 - 10.3  mg/dL   Total Protein 8.1 6.5 - 8.1 g/dL   Albumin 4.4 3.5 - 5.0 g/dL   AST 23 15 - 41 U/L   ALT 22 0 - 44 U/L   Alkaline Phosphatase 82 38 - 126 U/L   Total Bilirubin 0.5 0.3 - 1.2 mg/dL   GFR calc non Af Amer 28 (L) >60 mL/min   GFR calc Af Amer 32 (L) >60 mL/min   Anion gap 13 5 - 15    Comment: Performed at [facility], [address] [place], [place] [address]  CBC WITH DIFFERENTIAL     Status: Abnormal   Collection Time: 01/04/19 11:38 PM  Result Value Ref Range   WBC 15.6 (H) 4.0 - 10.5 K/uL   RBC 4.30 3.87 - 5.11 MIL/uL   Hemoglobin 12.2 12.0 - 15.0 g/dL   HCT 38.0 36.0 - 46.0 %   MCV 88.4 80.0 - 100.0 fL   MCH 28.4 26.0 - 34.0 pg   MCHC 32.1 30.0 - 36.0 g/dL   RDW 13.5 11.5 - 15.5 %   Platelets 265 150 - 400 K/uL   nRBC 0.0 0.0 - 0.2 %   Neutrophils Relative % 91 %   Neutro Abs 14.1 (H) 1.7 - 7.7 K/uL   Lymphocytes Relative 2 %   Lymphs Abs 0.3 (L) 0.7 - 4.0 K/uL   Monocytes Relative 6 %   Monocytes Absolute 1.0 0.1 - 1.0 K/uL   Eosinophils Relative 0 %   Eosinophils Absolute 0.1 0.0 - 0.5 K/uL   Basophils Relative 1 %   Basophils Absolute 0.1 0.0 - 0.1 K/uL   Immature Granulocytes 0 %   Abs Immature Granulocytes 0.06 0.00 - 0.07 K/uL    Comment: Performed at [facility], [address] [place], [place] [address]  APTT     Status: Abnormal   Collection Time: 01/04/19 11:38 PM  Result Value Ref Range   aPTT 38 (H) 24 - 36 seconds    Comment:        IF BASELINE aPTT IS ELEVATED, SUGGEST PATIENT RISK ASSESSMENT BE USED TO DETERMINE APPROPRIATE ANTICOAGULANT THERAPY. Performed at [facility], [address] [place], [place] [address]   Protime-INR     Status: None   Collection Time: 01/04/19 11:38 PM  Result Value Ref Range   Prothrombin Time 13.3 11.4 - 15.2 seconds   INR 1.0 0.8 - 1.2    Comment: (NOTE) INR goal varies based on device and disease states. Performed at [facility], [address],  [place], [place] [address]  Urinalysis, Routine w reflex microscopic     Status: Abnormal   Collection Time: 01/04/19 11:38 PM  Result Value Ref Range   Color, Urine AMBER (A) YELLOW    Comment: BIOCHEMICALS MAY BE AFFECTED BY COLOR   APPearance CLOUDY (A) CLEAR   Specific Gravity, Urine 1.025 1.005 - 1.030   pH 5.0 5.0 - 8.0   Glucose, UA NEGATIVE NEGATIVE mg/dL   Hgb urine dipstick SMALL (A) NEGATIVE   Bilirubin Urine NEGATIVE NEGATIVE   Ketones, ur NEGATIVE NEGATIVE mg/dL   Protein, ur 100 (A) NEGATIVE mg/dL   Nitrite NEGATIVE NEGATIVE   Leukocytes,Ua LARGE (A) NEGATIVE   RBC / HPF >50 (H) 0 - 5 RBC/hpf   WBC, UA >50 (H) 0 - 5 WBC/hpf   Bacteria, UA FEW (A) NONE SEEN   Squamous Epithelial / LPF 0-5 0 - 5   WBC Clumps PRESENT    Hyaline Casts, UA PRESENT     Comment: Performed at [facility], [address] [place], [place] [address]  SARS Coronavirus 2 [facility] order, Performed in [facility] hospital lab) Nasopharyngeal Nasopharyngeal Swab     Status: None   Collection Time: 01/05/19 12:31 AM   Specimen: Nasopharyngeal Swab  Result Value Ref Range   SARS Coronavirus 2 NEGATIVE NEGATIVE    Comment: (NOTE) If result is NEGATIVE SARS-CoV-2 target nucleic acids are NOT DETECTED. The SARS-CoV-2 RNA is generally detectable in upper and lower  respiratory specimens during the acute phase of infection. The lowest  concentration of SARS-CoV-2 viral copies this assay can detect is 250  copies / mL. A negative result does not preclude SARS-CoV-2 infection  and should not be used as the sole basis for treatment or other  patient management decisions.  A negative result may occur with  improper specimen collection / handling, submission of specimen other  than nasopharyngeal swab, presence of viral mutation(s) within the  areas targeted by this assay, and inadequate number of viral copies  (<250 copies / mL). A negative result must be combined with clinical  observations,  patient history, and epidemiological information. If result is POSITIVE SARS-CoV-2 target nucleic acids are DETECTED. The SARS-CoV-2 RNA is generally detectable in upper and lower  respiratory specimens dur ing the acute phase of infection.  Positive  results are indicative of active infection with SARS-CoV-2.  Clinical  correlation with patient history and other diagnostic information is  necessary to determine patient infection status.  Positive results do  not rule out bacterial infection or co-infection with other viruses. If result is PRESUMPTIVE POSTIVE SARS-CoV-2 nucleic acids MAY BE PRESENT.   A presumptive positive result was obtained on the submitted specimen  and confirmed on repeat testing.  While 2019 novel coronavirus  (SARS-CoV-2) nucleic acids may be present in the submitted sample  additional confirmatory testing may be necessary for epidemiological  and / or clinical management purposes  to differentiate between  SARS-CoV-2 and other Sarbecovirus currently known to infect humans.  If clinically indicated additional testing with an alternate test  methodology [phone]) is advised. The SARS-CoV-2 RNA is generally  detectable in upper and lower respiratory sp ecimens during the acute  phase of infection. The expected result is Negative. Fact Sheet for Patients:  StrictlyIdeas.no Fact Sheet for Healthcare Providers: BankingDealers.co.za This test is not yet approved or cleared by the [place] FDA and has been authorized for detection and/or diagnosis of SARS-CoV-2 by FDA under an Emergency Use Authorization (EUA).  This EUA will remain  in effect (meaning this test can be used) for the duration of the COVID-19 declaration under Section 564(b)(1) of the Act, 21 U.S.C. section 360bbb-3(b)(1), unless the authorization is terminated or revoked sooner. Performed at [facility], [address]  [place], [place] [address]   Lactic acid, plasma     Status: None   Collection Time: 01/05/19  2:10 AM  Result Value Ref Range   Lactic Acid, Venous 1.7 0.5 - 1.9 mmol/L    Comment: Performed at [facility], [place], [place] [address]   Ct Abdomen Pelvis Wo Contrast  Result Date: 01/05/2019 CLINICAL DATA:  Fever, chills, abdominal distention, nausea, vomiting EXAM: CT ABDOMEN AND PELVIS WITHOUT CONTRAST TECHNIQUE: Multidetector CT imaging of the abdomen and pelvis was performed following the standard protocol without IV contrast. COMPARISON:  11/05/2016 FINDINGS: Lower chest: Lung bases are clear. No effusions. Heart is normal size. Hepatobiliary: No focal hepatic abnormality. Gallbladder unremarkable. Pancreas: No focal abnormality or ductal dilatation. Spleen: No focal abnormality.  Normal size. Adrenals/Urinary Tract: Foley catheter within the bladder. No hydronephrosis. Adrenal glands unremarkable. Stomach/Bowel: Gaseous distension of the transverse colon with moderate stool burden. Small bowel decompressed. Stomach unremarkable. Vascular/Lymphatic: Aortic atherosclerosis. No evidence of aneurysm or adenopathy. Reproductive: Left ovarian cystic mass again noted measuring 6.4 x 4.9 cm compared to 4.9 x 4.3 cm previously. No right adnexal mass. Other: No free fluid or free air. Musculoskeletal: No acute bony abnormality. Postoperative changes in the lumbar spine. Severe degenerative disc disease throughout the lumbar spine. Moderate compression deformity at multiple levels in the lumbar spine are stable. IMPRESSION: Gaseous distention of the transverse colon. No evidence of bowel obstruction. This may reflect mild ileus. Aortic atherosclerosis. Enlarging left ovarian cystic mass now measuring up to 6.4 cm compared to 4.9 cm previously. This could be further characterized with elective pelvic ultrasound or MRI. Electronically Signed   By: Rolm Baptise M.D.   On: 01/05/2019 02:20    Dg Chest Port 1 View  Result Date: 01/04/2019 CLINICAL DATA:  Fever EXAM: PORTABLE CHEST 1 VIEW COMPARISON:  05/13/2018 FINDINGS: Heart and mediastinal contours are within normal limits. No focal opacities or effusions. No acute bony abnormality. IMPRESSION: No active disease. Electronically Signed   By: Rolm Baptise M.D.   On: 01/04/2019 23:58    Pending Labs Unresulted Labs (From admission, onward)    Start     Ordered   01/04/19 2336  Blood Culture (routine x 2)  BLOOD CULTURE X 2,   STAT     01/04/19 2336   01/04/19 2336  Urine culture  ONCE - STAT,   STAT     01/04/19 2336   Signed and Held  TSH  Add-on,   R     Signed and Held   Signed and Held  Hemoglobin A1c  Add-on,   R     Signed and Held          Vitals/Pain Today's Vitals   01/05/19 0500 01/05/19 0528 01/05/19 0530 01/05/19 0603  BP: 107/77  (!) 111/44 (!) 94/56  Pulse: 86  85 82  Resp: 11  15 12   Temp:      TempSrc:      SpO2: 98%  98% 98%  Weight:      Height:      PainSc:  Asleep  5     Isolation Precautions No active isolations  Medications Medications  piperacillin-tazobactam (ZOSYN) IVPB 3.375 g (3.375 g Intravenous New Bag/Given 01/05/19  0601)  ceFEPIme (MAXIPIME) 2 g in sodium chloride 0.9 % 100 mL IVPB (0 g Intravenous Stopped 01/05/19 0135)  metroNIDAZOLE (FLAGYL) IVPB 500 mg (0 mg Intravenous Stopped 01/05/19 0135)  vancomycin (VANCOCIN) IVPB 1000 mg/200 mL premix (0 mg Intravenous Stopped 01/05/19 0234)  sodium chloride 0.9 % bolus 1,000 mL (0 mLs Intravenous Stopped 01/05/19 0137)  sodium chloride 0.9 % bolus 1,000 mL (0 mLs Intravenous Stopped 01/05/19 0234)  acetaminophen (TYLENOL) tablet 1,000 mg (1,000 mg Oral Given 01/05/19 0136)  lactated ringers bolus 1,000 mL (0 mLs Intravenous Stopped 01/05/19 0234)  ipratropium-albuterol (DUONEB) 0.5-2.5 (3) MG/3ML nebulizer solution 3 mL (3 mLs Nebulization Given 01/05/19 0232)  HYDROmorphone (DILAUDID) injection 0.5 mg (0.5 mg Intravenous Given 01/05/19 0232)   ondansetron (ZOFRAN) injection 4 mg (4 mg Intravenous Given 01/05/19 0232)    Mobility walks with device Moderate fall risk   Focused Assessments Leg pain.   R Recommendations: See Admitting Provider Note  Report given to:   Additional Notes:

## 2019-01-05 NOTE — Progress Notes (Signed)
Pharmacy Antibiotic Note  [name] is a 68 y.o. female admitted on 01/04/2019 with sepsis.  Pharmacy has been consulted for Zosyn dosing.  Plan: Zosyn 3.375g IV q8h (4 hour infusion).  Height: 5\' 6"  (167.6 cm) Weight: (!) 325 lb (147.4 kg) IBW/kg (Calculated) : 59.3  Temp (24hrs), Avg:101.2 F (38.4 C), Min:99.1 F (37.3 C), Max:103.3 F (39.6 C)  Recent Labs  Lab 01/04/19 2338 01/05/19 0210  WBC 15.6*  --   CREATININE 1.84*  --   LATICACIDVEN 2.8* 1.7    Estimated Creatinine Clearance: 43.7 mL/min (A) (by C-G formula based on SCr of 1.84 mg/dL (H)).    No Known Allergies  Antimicrobials this admission: Zosyn 9/8 >>   Thank you for allowing pharmacy to be a part of this patient's care.  Paulina Fusi, PharmD, BCPS 01/05/2019 5:35 AM

## 2019-01-06 ENCOUNTER — Inpatient Hospital Stay (HOSPITAL_COMMUNITY)
Admit: 2019-01-06 | Discharge: 2019-01-06 | Disposition: A | Discharge status: Home / Self Care | Payer: Medicare Other | Attending: Internal Medicine | Admitting: Internal Medicine

## 2019-01-06 DIAGNOSIS — G35 Multiple sclerosis: Secondary | ICD-10-CM

## 2019-01-06 DIAGNOSIS — K0889 Other specified disorders of teeth and supporting structures: Secondary | ICD-10-CM

## 2019-01-06 DIAGNOSIS — N319 Neuromuscular dysfunction of bladder, unspecified: Secondary | ICD-10-CM

## 2019-01-06 DIAGNOSIS — I10 Essential (primary) hypertension: Secondary | ICD-10-CM

## 2019-01-06 DIAGNOSIS — I361 Nonrheumatic tricuspid (valve) insufficiency: Secondary | ICD-10-CM

## 2019-01-06 DIAGNOSIS — Z79899 Other long term (current) drug therapy: Secondary | ICD-10-CM

## 2019-01-06 DIAGNOSIS — J449 Chronic obstructive pulmonary disease, unspecified: Secondary | ICD-10-CM

## 2019-01-06 DIAGNOSIS — C8290 Follicular lymphoma, unspecified, unspecified site: Secondary | ICD-10-CM

## 2019-01-06 DIAGNOSIS — Z872 Personal history of diseases of the skin and subcutaneous tissue: Secondary | ICD-10-CM

## 2019-01-06 DIAGNOSIS — L03116 Cellulitis of left lower limb: Secondary | ICD-10-CM

## 2019-01-06 DIAGNOSIS — F339 Major depressive disorder, recurrent, unspecified: Secondary | ICD-10-CM

## 2019-01-06 DIAGNOSIS — Z960 Presence of urogenital implants: Secondary | ICD-10-CM

## 2019-01-06 DIAGNOSIS — Z87891 Personal history of nicotine dependence: Secondary | ICD-10-CM

## 2019-01-06 DIAGNOSIS — B951 Streptococcus, group B, as the cause of diseases classified elsewhere: Secondary | ICD-10-CM

## 2019-01-06 DIAGNOSIS — R7881 Bacteremia: Secondary | ICD-10-CM

## 2019-01-06 LAB — COMPREHENSIVE METABOLIC PANEL
ALT: 24 U/L (ref 0–44)
AST: 37 U/L (ref 15–41)
Albumin: 3.4 g/dL — ABNORMAL LOW (ref 3.5–5.0)
Alkaline Phosphatase: 60 U/L (ref 38–126)
Anion gap: 8 (ref 5–15)
BUN: 22 mg/dL (ref 8–23)
CO2: 22 mmol/L (ref 22–32)
Calcium: 8.3 mg/dL — ABNORMAL LOW (ref 8.9–10.3)
Chloride: 108 mmol/L (ref 98–111)
Creatinine, Ser: 1.38 mg/dL — ABNORMAL HIGH (ref 0.44–1.00)
GFR calc Af Amer: 45 mL/min — ABNORMAL LOW (ref >60)
GFR calc non Af Amer: 39 mL/min — ABNORMAL LOW (ref >60)
Glucose, Bld: 102 mg/dL — ABNORMAL HIGH (ref 70–99)
Potassium: 3.6 mmol/L (ref 3.5–5.1)
Sodium: 138 mmol/L (ref 135–145)
Total Bilirubin: 0.6 mg/dL (ref 0.3–1.2)
Total Protein: 6.3 g/dL — ABNORMAL LOW (ref 6.5–8.1)

## 2019-01-06 LAB — URINE CULTURE

## 2019-01-06 LAB — CBC WITH DIFFERENTIAL/PLATELET
Abs Immature Granulocytes: 0.03 10*3/uL (ref 0.00–0.07)
Basophils Absolute: 0.1 10*3/uL (ref 0.0–0.1)
Basophils Relative: 1 %
Eosinophils Absolute: 0 10*3/uL (ref 0.0–0.5)
Eosinophils Relative: 0 %
HCT: 29.1 % — ABNORMAL LOW (ref 36.0–46.0)
Hemoglobin: 9.5 g/dL — ABNORMAL LOW (ref 12.0–15.0)
Immature Granulocytes: 1 %
Lymphocytes Relative: 7 %
Lymphs Abs: 0.4 10*3/uL — ABNORMAL LOW (ref 0.7–4.0)
MCH: 29 pg (ref 26.0–34.0)
MCHC: 32.6 g/dL (ref 30.0–36.0)
MCV: 88.7 fL (ref 80.0–100.0)
Monocytes Absolute: 0.7 10*3/uL (ref 0.1–1.0)
Monocytes Relative: 11 %
Neutro Abs: 5.1 10*3/uL (ref 1.7–7.7)
Neutrophils Relative %: 80 %
Platelets: 147 10*3/uL — ABNORMAL LOW (ref 150–400)
RBC: 3.28 MIL/uL — ABNORMAL LOW (ref 3.87–5.11)
RDW: 13.7 % (ref 11.5–15.5)
WBC: 6.3 10*3/uL (ref 4.0–10.5)
nRBC: 0 % (ref 0.0–0.2)

## 2019-01-06 LAB — HEMOGLOBIN A1C
Hgb A1c MFr Bld: 5.6 % (ref 4.8–5.6)
Mean Plasma Glucose: 114 mg/dL

## 2019-01-06 LAB — ECHOCARDIOGRAM COMPLETE
Height: 66 in
Weight: 4853.65 oz

## 2019-01-06 MED ORDER — DULOXETINE HCL 60 MG PO CPEP
60.0000 mg | ORAL_CAPSULE | ORAL | Status: DC
  Administered 2019-01-07 – 2019-01-08 (×2): 60 mg via ORAL
  Filled 2019-01-06 (×2): qty 1

## 2019-01-06 MED ORDER — GABAPENTIN 600 MG PO TABS
600.0000 mg | ORAL_TABLET | Freq: Three times a day (TID) | ORAL | Status: DC
  Administered 2019-01-06 – 2019-01-08 (×7): 600 mg via ORAL
  Filled 2019-01-06 (×8): qty 1

## 2019-01-06 MED ORDER — QUETIAPINE FUMARATE ER 50 MG PO TB24
150.0000 mg | ORAL_TABLET | Freq: Every day | ORAL | Status: DC
  Administered 2019-01-06 – 2019-01-07 (×2): 150 mg via ORAL
  Filled 2019-01-06 (×3): qty 3

## 2019-01-06 MED ORDER — SODIUM CHLORIDE 0.9 % IV SOLN
2.0000 g | INTRAVENOUS | Status: DC
  Administered 2019-01-06 – 2019-01-08 (×3): 2 g via INTRAVENOUS
  Filled 2019-01-06: qty 20
  Filled 2019-01-06: qty 2
  Filled 2019-01-06: qty 20
  Filled 2019-01-06 (×2): qty 2

## 2019-01-06 MED ORDER — TIZANIDINE HCL 4 MG PO TABS
4.0000 mg | ORAL_TABLET | Freq: Three times a day (TID) | ORAL | Status: DC
  Administered 2019-01-06 – 2019-01-08 (×7): 4 mg via ORAL
  Filled 2019-01-06 (×8): qty 1

## 2019-01-06 MED ORDER — CLONAZEPAM 0.5 MG PO TABS
0.5000 mg | ORAL_TABLET | Freq: Every day | ORAL | Status: DC
  Administered 2019-01-06 – 2019-01-07 (×2): 0.5 mg via ORAL
  Filled 2019-01-06 (×2): qty 1

## 2019-01-06 MED ORDER — METRONIDAZOLE IN NACL 5-0.79 MG/ML-% IV SOLN
500.0000 mg | Freq: Three times a day (TID) | INTRAVENOUS | Status: DC
  Administered 2019-01-06 – 2019-01-08 (×6): 500 mg via INTRAVENOUS
  Filled 2019-01-06 (×9): qty 100

## 2019-01-06 MED ORDER — BENZOCAINE 10 % MT GEL
Freq: Three times a day (TID) | OROMUCOSAL | Status: DC | PRN
  Administered 2019-01-06: 1 via OROMUCOSAL
  Filled 2019-01-06: qty 9

## 2019-01-06 NOTE — TOC Progression Note (Signed)
Transition of Care [facility]) - Progression Note    Patient Details  Name: [name] MRN: [id number] Date of Birth: 1950-12-20  Transition of Care [facility]) CM/SW [place], RN Phone Number: 01/06/2019, 10:08 AM  Clinical Narrative:    Levada Schilling with [facility] that the patient has a possibility of needing IV ABX at home, she will follow if needed. [facility] free or low cost information and provided to the patient the phone numbers to call to get information       Expected Discharge Plan and Services                                                 Social Determinants of Health (SDOH) Interventions    Readmission Risk Interventions No flowsheet data found.

## 2019-01-06 NOTE — Consult Note (Signed)
NAME: [name]  DOB: March 24, 1951  MRN: [id number]  Date/Time: 01/06/2019 6:55 PM  REQUESTING PROVIDER: Dr. Margaretmary Eddy Subjective:  REASON FOR CONSULT: Group B streptococcus bacteremia ? [name] is a 68 y.o. female with a history of multiple sclerosis, COPD, edema legs with history of cellulitis, follicular lymphoma, hypertension, chronic Foley presents with fever chills and weakness from home.  Patient came to the ED on 01/04/2019.  Her symptoms have been present for 1 day.  As per patient she has a chronic Foley catheter.  She had noticed dark smelling urine the Foley, she had poor appetite, she was progressively worsening with nausea and weakness and then she developed chills fever and fatigue in the past 24 hours.  As she could not stand up at home she came to the ED. Patient takes Aubagio (teriflunomide) for multiple sclerosis She is also been complaining of pain in her teeth this is been going on for many months now but it is worse recently. She also felt that her left leg was hurting. Past Medical History:  Diagnosis Date  . Abdominal aortic atherosclerosis ([place]) 11/11/2016  . ADHD   . Anxiety   . COPD (chronic obstructive pulmonary disease) ([place])   . Depression    major depressive  . Dyspnea    doe  . Edema    left leg  . Follicular lymphoma ([place])    B Cell  . Follicular lymphoma grade II ([place])   . Hypertension   . Hypotension    idiopathic  . Kyphoscoliosis and scoliosis 11/26/2011  . Morbid obesity ([place]) 01/05/2016  . Multiple sclerosis ([place])   . Multiple sclerosis ([place])    1980's  . Neuromuscular disorder ([place])   . Obstructive and reflux uropathy    foley  . Pain    atypical facial  . Peripheral vascular disease of lower extremity with ulceration ([place]) 11/08/2015  . Skin ulcer ([place]) 11/08/2015  . Weakness    generalized. has MS    Past Surgical History:  Procedure Laterality Date  . BACK SURGERY N/A 2002  . CYST EXCISION     lower back  . INGUINAL LYMPH NODE  BIOPSY Left 07/04/2016   Procedure: INGUINAL LYMPH NODE BIOPSY;  Surgeon: Christene Lye, MD;  Location: [facility] ORS;  Service: General;  Laterality: Left;  . PORTACATH PLACEMENT N/A 07/22/2016   Procedure: INSERTION PORT-A-CATH;  Surgeon: Christene Lye, MD;  Location: [facility] ORS;  Service: General;  Laterality: N/A;  . TONSILLECTOMY AND ADENOIDECTOMY    . TUBAL LIGATION      Social History   Socioeconomic History  . Marital status: Married    Spouse name: Not on file  . Number of children: 3  . Years of education: Not on file  . Highest education level: Not on file  Occupational History  . Occupation: disabled  Social Needs  . Financial resource strain: Not on file  . Food insecurity    Worry: Not on file    Inability: Not on file  . Transportation needs    Medical: Not on file    Non-medical: Not on file  Tobacco Use  . Smoking status: Former Smoker    Packs/day: 1.00    Years: 20.00    Pack years: 20.00    Types: Cigarettes    Start date: 04/30/1995    Quit date: 02/03/2016    Years since quitting: 2.9  . Smokeless tobacco: Never Used  Substance and Sexual Activity  . Alcohol use: No  Alcohol/week: 0.0 standard drinks  . Drug use: Yes    Types: Marijuana    Comment: smokes THC occasionally per pt   . Sexual activity: Not Currently    Birth control/protection: None  Lifestyle  . Physical activity    Days per week: Not on file    Minutes per session: Not on file  . Stress: Not on file  Relationships  . Social Herbalist on phone: Not on file    Gets together: Not on file    Attends religious service: Not on file    Active member of club or organization: Not on file    Attends meetings of clubs or organizations: Not on file    Relationship status: Not on file  . Intimate partner violence    Fear of current or ex partner: Not on file    Emotionally abused: Not on file    Physically abused: Not on file    Forced sexual activity: Not on file   Other Topics Concern  . Not on file  Social History Narrative   She is married.     Family History  Problem Relation Age of Onset  . COPD Mother   . Diabetes Mother   . Heart failure Mother   . Alcohol abuse Father   . Kidney disease Father   . Kidney failure Father   . Arthritis Sister   . CAD Maternal Grandmother   . Stroke Maternal Grandfather   . Arthritis Sister   . Mental illness Sister   . Arthritis Brother    No Known Allergies  ? Current Facility-Administered Medications  Medication Dose Route Frequency Provider Last Rate Last Dose  . 0.9 %  sodium chloride infusion   Intravenous Continuous Harrie Foreman, MD 125 mL/hr at 01/06/19 1600    . acetaminophen (TYLENOL) tablet 650 mg  650 mg Oral Q6H PRN Harrie Foreman, MD       Or  . acetaminophen (TYLENOL) suppository 650 mg  650 mg Rectal Q6H PRN Harrie Foreman, MD      . benzocaine (ORAJEL) 10 % mucosal gel   Mouth/Throat TID PRN Gouru, Aruna, MD      . cefTRIAXone (ROCEPHIN) 2 g in sodium chloride 0.9 % 100 mL IVPB  2 g Intravenous Q24H Nicholes Mango, MD   Stopped at 01/06/19 1534  . clonazePAM (KLONOPIN) tablet 0.5 mg  0.5 mg Oral QHS Gouru, Aruna, MD      . docusate sodium (COLACE) capsule 100 mg  100 mg Oral BID Harrie Foreman, MD   100 mg at 01/06/19 0843  . [START ON 01/07/2019] DULoxetine (CYMBALTA) DR capsule 60 mg  60 mg Oral Archie Balboa, Aruna, MD      . gabapentin (NEURONTIN) tablet 600 mg  600 mg Oral TID Gouru, Aruna, MD   600 mg at 01/06/19 1630  . heparin injection 5,000 Units  5,000 Units Subcutaneous Q8H Harrie Foreman, MD   5,000 Units at 01/06/19 1405  . mirabegron ER (MYRBETRIQ) tablet 50 mg  50 mg Oral Daily Harrie Foreman, MD   50 mg at 01/06/19 0843  . morphine 2 MG/ML injection 1 mg  1 mg Intravenous Q4H PRN Gouru, Aruna, MD   1 mg at 01/06/19 0241  . ondansetron (ZOFRAN) tablet 4 mg  4 mg Oral Q6H PRN Harrie Foreman, MD   4 mg at 01/06/19 1630   Or  . ondansetron  (ZOFRAN) injection 4 mg  4 mg Intravenous Q6H PRN Harrie Foreman, MD      . QUEtiapine (SEROQUEL XR) 24 hr tablet 150 mg  150 mg Oral QHS Gouru, Aruna, MD      . tiZANidine (ZANAFLEX) tablet 4 mg  4 mg Oral TID Nicholes Mango, MD   4 mg at 01/06/19 1630  . traMADol (ULTRAM) tablet 50 mg  50 mg Oral Q6H PRN Gouru, Aruna, MD   50 mg at 01/06/19 [id number]   Facility-Administered Medications Ordered in Other Encounters  Medication Dose Route Frequency Provider Last Rate Last Dose  . heparin lock flush 100 unit/mL  500 Units Intracatheter Once PRN Sindy Guadeloupe, MD         Abtx:  Anti-infectives (From admission, onward)   Start     Dose/Rate Route Frequency Ordered Stop   01/06/19 1200  cefTRIAXone (ROCEPHIN) 2 g in sodium chloride 0.9 % 100 mL IVPB     2 g 200 mL/hr over 30 Minutes Intravenous Every 24 hours 01/06/19 0907     01/05/19 0600  piperacillin-tazobactam (ZOSYN) IVPB 3.375 g  Status:  Discontinued     3.375 g 12.5 mL/hr over 240 Minutes Intravenous Every 8 hours 01/05/19 0534 01/06/19 0919   01/05/19 0015  ceFEPIme (MAXIPIME) 2 g in sodium chloride 0.9 % 100 mL IVPB     2 g 200 mL/hr over 30 Minutes Intravenous  Once 01/05/19 0010 01/05/19 0135   01/05/19 0015  metroNIDAZOLE (FLAGYL) IVPB 500 mg     500 mg 100 mL/hr over 60 Minutes Intravenous  Once 01/05/19 0010 01/05/19 0135   01/05/19 0015  vancomycin (VANCOCIN) IVPB 1000 mg/200 mL premix     1,000 mg 200 mL/hr over 60 Minutes Intravenous  Once 01/05/19 0010 01/05/19 0234      REVIEW OF SYSTEMS:  Const:  fever,  chills, negative weight loss Eyes: negative diplopia or visual changes, negative eye pain ENT: negative coryza, negative sore throat, has tooth pain  Resp: negative cough, hemoptysis, dyspnea, Cards: negative for chest pain, palpitations, lower extremity edema GU: has foley GI: Negative for abdominal pain, some diarrhea, no bleeding, constipation Skin: negative for rash and pruritus Heme: negative for easy  bruising and gum/nose bleeding MS:  myalgias,  and muscle weakness Neurolo:negative for headaches, dizziness, vertigo, memory problems  Psych: negative for feelings of anxiety, depression  Endocrine: no diabetets Allergy/Immunology- negative for any medication or food allergies ? Objective:  VITALS:  BP (!) 158/62 (BP Location: Left Arm)   Pulse 77   Temp 98.8 F (37.1 C) (Oral)   Resp 20   Ht 5\' 6"  (1.676 m)   Wt (!) 137.6 kg   SpO2 99%   BMI 48.96 kg/m  PHYSICAL EXAM:  General: lethargic, responds to commands appropriately, very obese Head: Normocephalic, without obvious abnormality, atraumatic. Eyes: Conjunctivae clear, anicteric sclerae. Pupils are equal ENT Nares normal. No drainage or sinus tenderness. Lips, mucosa, and tongue normal. No Thrush Poor dentition Neck: Supple, symmetrical, no adenopathy, thyroid: non tender no carotid bruit and no JVD. Back: No CVA tenderness. Lungs: Clear to auscultation bilaterally. No Wheezing or Rhonchi. No rales. Heart: Regular rate and rhythm, no murmur, rub or gallop. Abdomen: Soft, non-tender,not distended. Bowel sounds normal. No masses Extremities: b/l lower extremity edema Left leg warm, mild erythema    Skin: No rashes or lesions. Or bruising Lymph: Cervical, supraclavicular normal. Neurologic: Grossly non-focal Pertinent Labs Lab Results CBC    Component Value Date/Time   WBC 6.3 01/06/2019 0401  RBC 3.28 (L) 01/06/2019 0401   HGB 9.5 (L) 01/06/2019 0401   HGB 13.6 08/01/2013 2312   HCT 29.1 (L) 01/06/2019 0401   HCT 40.2 08/01/2013 2312   PLT 147 (L) 01/06/2019 0401   PLT 274 08/01/2013 2312   MCV 88.7 01/06/2019 0401   MCV 87 08/01/2013 2312   MCH 29.0 01/06/2019 0401   MCHC 32.6 01/06/2019 0401   RDW 13.7 01/06/2019 0401   RDW 14.2 08/01/2013 2312   LYMPHSABS 0.4 (L) 01/06/2019 0401   LYMPHSABS 1.5 07/14/2013 1444   MONOABS 0.7 01/06/2019 0401   MONOABS 0.7 07/14/2013 1444   EOSABS 0.0 01/06/2019 0401    EOSABS 0.2 07/14/2013 1444   BASOSABS 0.1 01/06/2019 0401   BASOSABS 0.1 07/14/2013 1444    CMP Latest Ref Rng & Units 01/06/2019 01/04/2019 06/12/2018  Glucose 70 - 99 mg/dL 102(H) 125(H) 120(H)  BUN 8 - 23 mg/dL 22 27(H) 13  Creatinine 0.44 - 1.00 mg/dL 1.38(H) 1.84(H) 1.03(H)  Sodium 135 - 145 mmol/L 138 136 139  Potassium 3.5 - 5.1 mmol/L 3.6 4.5 3.6  Chloride 98 - 111 mmol/L 108 100 106  CO2 22 - 32 mmol/L 22 23 25   Calcium 8.9 - 10.3 mg/dL 8.3(L) 9.4 8.8(L)  Total Protein 6.5 - 8.1 g/dL 6.3(L) 8.1 -  Total Bilirubin 0.3 - 1.2 mg/dL 0.6 0.5 -  Alkaline Phos 38 - 126 U/L 60 82 -  AST 15 - 41 U/L 37 23 -  ALT 0 - 44 U/L 24 22 -      Microbiology: Recent Results (from the past 240 hour(s))  Blood Culture (routine x 2)     Status: Abnormal (Preliminary result)   Collection Time: 01/04/19 11:38 PM   Specimen: BLOOD  Result Value Ref Range Status   Specimen Description   Final    BLOOD LEFT ANTECUBITAL Performed at [facility], [address] [place], [place] [address]    Special Requests   Final    BOTTLES DRAWN AEROBIC AND ANAEROBIC Blood Culture adequate volume Performed at [facility], [address], [place], [place] [address]    Culture  Setup Time   Final    GRAM POSITIVE COCCI AEROBIC BOTTLE ONLY CRITICAL VALUE NOTED.  VALUE IS CONSISTENT WITH PREVIOUSLY REPORTED AND CALLED VALUE. Performed at [facility], [address] [place], [place] [address]    Culture GROUP B STREP(S.AGALACTIAE)ISOLATED (A)  Final   Report Status PENDING  Incomplete  Urine culture     Status: Abnormal   Collection Time: 01/04/19 11:38 PM   Specimen: In/Out Cath Urine  Result Value Ref Range Status   Specimen Description   Final    IN/OUT CATH URINE Performed at [facility], [address] [place], [place] [address]    Special Requests   Final    NONE Performed at [facility], [address] [place], [place] [address]     Culture MULTIPLE SPECIES PRESENT, SUGGEST RECOLLECTION (A)  Final   Report Status 01/06/2019 FINAL  Final  Blood Culture (routine x 2)     Status: Abnormal (Preliminary result)   Collection Time: 01/04/19 11:40 PM   Specimen: BLOOD  Result Value Ref Range Status   Specimen Description   Final    BLOOD RIGHT ANTECUBITAL Performed at [facility], [address], [place], [place] [address]    Special Requests   Final    BOTTLES DRAWN AEROBIC AND ANAEROBIC Blood Culture adequate volume Performed at  [address] [place], [place] [address]    Culture  Setup Time   Final    IN BOTH AEROBIC AND ANAEROBIC BOTTLES GRAM POSITIVE COCCI CRITICAL RESULT CALLED TO, READ BACK BY AND VERIFIED WITH: SHEEMA HALLAJI AT [id number] ON 01/05/2019 JJB    Culture (A)  Final    GROUP B STREP(S.AGALACTIAE)ISOLATED SUSCEPTIBILITIES TO FOLLOW Performed at [facility], [place] [address], [place], [place] [address]    Report Status PENDING  Incomplete  Blood Culture ID Panel (Reflexed)     Status: Abnormal   Collection Time: 01/04/19 11:40 PM  Result Value Ref Range Status   Enterococcus species NOT DETECTED NOT DETECTED Final   Listeria monocytogenes NOT DETECTED NOT DETECTED Final   Staphylococcus species NOT DETECTED NOT DETECTED Final   Staphylococcus aureus (BCID) NOT DETECTED NOT DETECTED Final   Streptococcus species DETECTED (A) NOT DETECTED Final    Comment: CRITICAL RESULT CALLED TO, READ BACK BY AND VERIFIED WITH: SHEEMA HALLAJI AT 1432 ON 01/05/2019 JJB    Streptococcus agalactiae DETECTED (A) NOT DETECTED Final    Comment: CRITICAL RESULT CALLED TO, READ BACK BY AND VERIFIED WITH: SHEEMA HALLAJI AT [id number] ON 01/05/2019 JJB    Streptococcus pneumoniae NOT DETECTED NOT DETECTED Final   Streptococcus pyogenes NOT DETECTED NOT DETECTED Final   Acinetobacter baumannii NOT DETECTED NOT DETECTED Final   Enterobacteriaceae species NOT DETECTED NOT DETECTED Final    Enterobacter cloacae complex NOT DETECTED NOT DETECTED Final   Escherichia coli NOT DETECTED NOT DETECTED Final   Klebsiella oxytoca NOT DETECTED NOT DETECTED Final   Klebsiella pneumoniae NOT DETECTED NOT DETECTED Final   Proteus species NOT DETECTED NOT DETECTED Final   Serratia marcescens NOT DETECTED NOT DETECTED Final   Haemophilus influenzae NOT DETECTED NOT DETECTED Final   Neisseria meningitidis NOT DETECTED NOT DETECTED Final   Pseudomonas aeruginosa NOT DETECTED NOT DETECTED Final   Candida albicans NOT DETECTED NOT DETECTED Final   Candida glabrata NOT DETECTED NOT DETECTED Final   Candida krusei NOT DETECTED NOT DETECTED Final   Candida parapsilosis NOT DETECTED NOT DETECTED Final   Candida tropicalis NOT DETECTED NOT DETECTED Final    Comment: Performed at [facility], [address] [place], [place] [address]  SARS Coronavirus 2 [facility] order, Performed in [facility] hospital lab) Nasopharyngeal Nasopharyngeal Swab     Status: None   Collection Time: 01/05/19 12:31 AM   Specimen: Nasopharyngeal Swab  Result Value Ref Range Status   SARS Coronavirus 2 NEGATIVE NEGATIVE Final    Comment: (NOTE) If result is NEGATIVE SARS-CoV-2 target nucleic acids are NOT DETECTED. The SARS-CoV-2 RNA is generally detectable in upper and lower  respiratory specimens during the acute phase of infection. The lowest  concentration of SARS-CoV-2 viral copies this assay can detect is 250  copies / mL. A negative result does not preclude SARS-CoV-2 infection  and should not be used as the sole basis for treatment or other  patient management decisions.  A negative result may occur with  improper specimen collection / handling, submission of specimen other  than nasopharyngeal swab, presence of viral mutation(s) within the  areas targeted by this assay, and inadequate number of viral copies  (<250 copies / mL). A negative result must be combined with clinical  observations, patient  history, and epidemiological information. If result is POSITIVE SARS-CoV-2 target nucleic acids are DETECTED. The SARS-CoV-2 RNA is generally detectable in upper and lower  respiratory specimens dur  ing the acute phase of infection.  Positive  results are indicative of active infection with SARS-CoV-2.  Clinical  correlation with patient history and other diagnostic information is  necessary to determine patient infection status.  Positive results do  not rule out bacterial infection or co-infection with other viruses. If result is PRESUMPTIVE POSTIVE SARS-CoV-2 nucleic acids MAY BE PRESENT.   A presumptive positive result was obtained on the submitted specimen  and confirmed on repeat testing.  While 2019 novel coronavirus  (SARS-CoV-2) nucleic acids may be present in the submitted sample  additional confirmatory testing may be necessary for epidemiological  and / or clinical management purposes  to differentiate between  SARS-CoV-2 and other Sarbecovirus currently known to infect humans.  If clinically indicated additional testing with an alternate test  methodology [phone]) is advised. The SARS-CoV-2 RNA is generally  detectable in upper and lower respiratory sp ecimens during the acute  phase of infection. The expected result is Negative. Fact Sheet for Patients:  StrictlyIdeas.no Fact Sheet for Healthcare Providers: BankingDealers.co.za This test is not yet approved or cleared by the [place] FDA and has been authorized for detection and/or diagnosis of SARS-CoV-2 by FDA under an Emergency Use Authorization (EUA).  This EUA will remain in effect (meaning this test can be used) for the duration of the COVID-19 declaration under Section 564(b)(1) of the Act, 21 U.S.C. section 360bbb-3(b)(1), unless the authorization is terminated or revoked sooner. Performed at [facility], [address] [place], [place]  [address]     IMAGING RESULTS: Chest x-ray no infiltrate I have personally reviewed the films ? Impression/Recommendation 68 year old female with history of multiple sclerosis having chronic Foley for neurogenic bladder is admitted with fever and chills.  Group B streptococcus bacteremia.  The source is possibly the left leg where she has mild cellulitis.  Also has very bad dentition. We will get a 2D echo.  Continue ceftriaxone.  May add Flagyl because of poor dentition  Neurogenic bladder with chronic Foley catheter.  Urinalysis showed more than 50 WBCs.  Urine culture was multiple specimens.  First need to figure out when she had her Foley catheter changed and we have to change it here.  Multiple sclerosis on Aubagio.  Followed by Dr. Brigitte Pulse  Depression on quetiapine and duloxetine ? ? ? ___________________________________________________ Discussed with patient, and pharmacist.  Also discussed with her nurses. Note:  This document was prepared using Dragon voice recognition software and may include unintentional dictation errors.

## 2019-01-06 NOTE — TOC Initial Note (Signed)
Transition of Care [facility]) - Initial/Assessment Note    Patient Details  Name: [name] MRN: [id number] Date of Birth: 1950-06-06  Transition of Care [facility]) CM/SW Contact:    Su Hilt, RN Phone Number: 01/06/2019, 10:20 AM  Clinical Narrative:                  The patient lives at home with her spouse and he provides her with Transportation She goes to Dr Ouida Sills as PCP and is up to date with appointments She uses [facility] for Pharmacy and can afford her medications She has used [facility] for [facility] previosly and would like to again if needed and she can get them Will continue to follow for needs Expected Discharge Plan: [place] Barriers to Discharge: Continued Medical Work up   Patient Goals and CMS Choice Patient states their goals for this hospitalization and ongoing recovery are:: go home      Expected Discharge Plan and Services Expected Discharge Plan: [place]   Discharge Planning Services: CM Consult   Living arrangements for the past 2 months: Single Family Home                 DME Arranged: N/A                    Prior Living Arrangements/Services Living arrangements for the past 2 months: Single Family Home Lives with:: Spouse Patient language and need for interpreter reviewed:: Yes        Need for Family Participation in Patient Care: No (Comment) Care giver support system in place?: Yes (comment) Current home services: DME(rollator, [facility]) Criminal Activity/Legal Involvement Pertinent to Current Situation/Hospitalization: No - Comment as needed  Activities of Daily Living Home Assistive Devices/Equipment: Environmental consultant (specify type), Shower chair with back, [employer] fast streamer ADL Screening (condition at time of admission) Patient's cognitive ability adequate to safely complete daily activities?: No Is the patient deaf or have difficulty hearing?: Yes Does the patient have difficulty seeing, even when wearing  glasses/contacts?: Yes Does the patient have difficulty concentrating, remembering, or making decisions?: Yes Patient able to express need for assistance with ADLs?: Yes Does the patient have difficulty dressing or bathing?: Yes Independently performs ADLs?: No Communication: Independent Dressing (OT): Needs assistance Is this a change from baseline?: Pre-admission baseline Grooming: Needs assistance Is this a change from baseline?: Pre-admission baseline Feeding: Independent Bathing: Needs assistance Is this a change from baseline?: Pre-admission baseline Toileting: Needs assistance Is this a change from baseline?: Pre-admission baseline In/Out Bed: Needs assistance Is this a change from baseline?: Pre-admission baseline Walks in Home: Needs assistance Is this a change from baseline?: Pre-admission baseline Does the patient have difficulty walking or climbing stairs?: Yes Weakness of Legs: Both Weakness of Arms/Hands: Both  Permission Sought/Granted   Permission granted to share information with : Yes, Verbal Permission Granted              Emotional Assessment Appearance:: Appears stated age Attitude/Demeanor/Rapport: Engaged Affect (typically observed): Accepting, Appropriate, Calm Orientation: : Oriented to Self, Oriented to Place, Oriented to  Time, Oriented to Situation Alcohol / Substance Use: Not Applicable Psych Involvement: No (comment)  Admission diagnosis:  AKI (acute kidney injury) ([place]) [N17.9] Sepsis secondary to UTI ([place]) [A41.9, N39.0] Patient Active Problem List   Diagnosis Date Noted  . Sepsis ([place]) 01/05/2019  . UTI (urinary tract infection) 06/13/2018  . Altered mental status 06/11/2018  . Fall 05/13/2018  . Depression  05/13/2018  . Recurrent cellulitis of lower extremity 06/25/2017  . Medication monitoring encounter 06/05/2017  . Cellulitis of left lower leg 04/25/2017  . Adjustment disorder with mixed disturbance of emotions and conduct  04/23/2017  . Foot pain, bilateral 02/03/2017  . Tinea pedis 02/03/2017  . Left ovarian cyst 12/09/2016  . Abdominal aortic atherosclerosis ([place]) 11/11/2016  . Swelling of lower extremity 10/11/2016  . Obstructive sleep apnea 10/11/2016  . Follicular lymphoma of intra-abdominal lymph nodes ([place]) 08/06/2016  . Multiple falls 06/07/2016  . Inguinal adenopathy 05/29/2016  . Obesity, morbid ([place]) 01/05/2016  . Low HDL (under 40) 12/19/2015  . Skin ulcer ([place]) 11/08/2015  . Peripheral vascular disease of lower extremity with ulceration ([place]) 11/08/2015  . CKD (chronic kidney disease) stage 3, GFR 30-59 ml/min (HCC) 11/08/2015  . Constipation due to pain medication 01/31/2015  . Obstructive sleep apnea of adult 01/13/2015  . Pelvic muscle wasting 01/13/2015  . Incomplete bladder emptying 01/13/2015  . Headache, migraine 10/24/2014  . 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 Current tobacco use 10/24/2014  . Lumbar radiculopathy, chronic 10/02/2013  . COPD with bronchial hyperresponsiveness ([place]) 10/02/2013  . Major depressive disorder, recurrent, in partial remission ([place]) 10/02/2013  . Hypertension goal BP (blood pressure) < 140/90 10/02/2013  . Multiple sclerosis ([place]) 10/02/2013  . Absence of bladder continence 09/25/2012  . Acontractile bladder 02/12/2012  . Narrowing of intervertebral disc space 11/26/2011  . Kyphoscoliosis and scoliosis 11/26/2011   PCP:  Kirk Ruths, MD Pharmacy:   [place], [place] - [address] [place] [address] Phone: [phone] Fax: [phone]  [facility], [place], [place] - [address] [place] [address] Phone: [phone] Fax: [phone]     Social Determinants of Health (SDOH) Interventions    Readmission Risk Interventions No flowsheet data found.

## 2019-01-06 NOTE — Progress Notes (Signed)
*  PRELIMINARY RESULTS* Echocardiogram 2D Echocardiogram has been performed.  [name] 01/06/2019, 12:48 PM

## 2019-01-06 NOTE — Progress Notes (Signed)
Called MD (Gouru) r/t patient home meds, awaiting response.

## 2019-01-06 NOTE — Progress Notes (Signed)
[place] at [place] NAME: [name]    MR#:  [id number]  DATE OF BIRTH:  1950-10-23  SUBJECTIVE:  CHIEF COMPLAINT: Patient is resting comfortably.  Reports she has so many rotten teeth which need to be pulled out but she does not have any insurance denies any abdominal pain.  Denies any nausea vomiting  REVIEW OF SYSTEMS:  CONSTITUTIONAL: No fever, fatigue or weakness.  EYES: No blurred or double vision.  EARS, NOSE, AND THROAT: No tinnitus or ear pain.  RESPIRATORY: No cough, shortness of breath, wheezing or hemoptysis.  CARDIOVASCULAR: No chest pain, orthopnea, edema.  GASTROINTESTINAL: No nausea, vomiting, diarrhea or abdominal pain.  GENITOURINARY: No dysuria, hematuria.  ENDOCRINE: No polyuria, nocturia,  HEMATOLOGY: No anemia, easy bruising or bleeding SKIN: No rash or lesion. MUSCULOSKELETAL: No joint pain or arthritis.   NEUROLOGIC: No tingling, numbness, weakness.  PSYCHIATRY: No anxiety or depression.   DRUG ALLERGIES:  No Known Allergies  VITALS:  Blood pressure (!) 144/65, pulse 79, temperature 99.1 F (37.3 C), temperature source Oral, resp. rate 20, height 5\' 6"  (1.676 m), weight (!) 137.6 kg, SpO2 98 %.  PHYSICAL EXAMINATION:  GENERAL:  68 y.o.-year-old patient lying in the bed with no acute distress.  EYES: Pupils equal, round, reactive to light and accommodation. No scleral icterus. Extraocular muscles intact.  HEENT: Head atraumatic, normocephalic. Oropharynx and nasopharynx clear.  NECK:  Supple, no jugular venous distention. No thyroid enlargement, no tenderness.  LUNGS: Normal breath sounds bilaterally, no wheezing, rales,rhonchi or crepitation. No use of accessory muscles of respiration.  CARDIOVASCULAR: S1, S2 normal. No murmurs, rubs, or gallops.  ABDOMEN: Soft, nontender, nondistended. Bowel sounds present.  EXTREMITIES: No pedal edema, cyanosis, or clubbing.  NEUROLOGIC: Cranial nerves II through  XII are intact. Muscle strength global weakness in all extremities. Sensation intact. Gait not checked.  PSYCHIATRIC: The patient is alert and oriented x 3.  SKIN: No obvious rash, lesion, or ulcer.    LABORATORY PANEL:   CBC Recent Labs  Lab 01/06/19 0401  WBC 6.3  HGB 9.5*  HCT 29.1*  PLT 147*   ------------------------------------------------------------------------------------------------------------------  Chemistries  Recent Labs  Lab 01/06/19 0401  NA 138  K 3.6  CL 108  CO2 22  GLUCOSE 102*  BUN 22  CREATININE 1.38*  CALCIUM 8.3*  AST 37  ALT 24  ALKPHOS 60  BILITOT 0.6   ------------------------------------------------------------------------------------------------------------------  Cardiac Enzymes No results for input(s): TROPONINI in the last 168 hours. ------------------------------------------------------------------------------------------------------------------  RADIOLOGY:  Ct Abdomen Pelvis Wo Contrast  Result Date: 01/05/2019 CLINICAL DATA:  Fever, chills, abdominal distention, nausea, vomiting EXAM: CT ABDOMEN AND PELVIS WITHOUT CONTRAST TECHNIQUE: Multidetector CT imaging of the abdomen and pelvis was performed following the standard protocol without IV contrast. COMPARISON:  11/05/2016 FINDINGS: Lower chest: Lung bases are clear. No effusions. Heart is normal size. Hepatobiliary: No focal hepatic abnormality. Gallbladder unremarkable. Pancreas: No focal abnormality or ductal dilatation. Spleen: No focal abnormality.  Normal size. Adrenals/Urinary Tract: Foley catheter within the bladder. No hydronephrosis. Adrenal glands unremarkable. Stomach/Bowel: Gaseous distension of the transverse colon with moderate stool burden. Small bowel decompressed. Stomach unremarkable. Vascular/Lymphatic: Aortic atherosclerosis. No evidence of aneurysm or adenopathy. Reproductive: Left ovarian cystic mass again noted measuring 6.4 x 4.9 cm compared to 4.9 x 4.3 cm  previously. No right adnexal mass. Other: No free fluid or free air. Musculoskeletal: No acute bony abnormality. Postoperative changes in the lumbar spine. Severe degenerative disc disease throughout the  lumbar spine. Moderate compression deformity at multiple levels in the lumbar spine are stable. IMPRESSION: Gaseous distention of the transverse colon. No evidence of bowel obstruction. This may reflect mild ileus. Aortic atherosclerosis. Enlarging left ovarian cystic mass now measuring up to 6.4 cm compared to 4.9 cm previously. This could be further characterized with elective pelvic ultrasound or MRI. Electronically Signed   By: Rolm Baptise M.D.   On: 01/05/2019 02:20   Dg Chest Port 1 View  Result Date: 01/04/2019 CLINICAL DATA:  Fever EXAM: PORTABLE CHEST 1 VIEW COMPARISON:  05/13/2018 FINDINGS: Heart and mediastinal contours are within normal limits. No focal opacities or effusions. No acute bony abnormality. IMPRESSION: No active disease. Electronically Signed   By: Rolm Baptise M.D.   On: 01/04/2019 23:58    EKG:   Orders placed or performed during the hospital encounter of 01/04/19  . ED EKG 12-Lead  . ED EKG 12-Lead  . ED EKG 12-Lead  . ED EKG 12-Lead  . EKG 12-Lead  . EKG 12-Lead    ASSESSMENT AND PLAN:     1.    Sepsis present on admission the patient meets criteria for sepsis via tachycardia, leukocytosis and fever.   She is hemodynamically stable.   Blood cultures with Streptococcus  IV antibiotics Zosyn changed to 2 g of IV Rocephin ID consult placed, Dr. Levester Fresh is aware Echocardiogram to rule out cardiac thrombus/vegetation  2.  UTI: Present on admission.  Is an indwelling Foley catheter.  Urine culture contaminated  We will continue Rocephin based on previous culture results and sensitivities.  3.  Acute kidney injury: Clinically improving ,creatinine 1.84-1.38  hydrate with intravenous fluid.  Avoid nephrotoxic agents.  Check a.m. labs  4.  Hypertension:  Labile; pharmacy to review medications for reconciliation.  5.  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 Janese Banks as an outpatient CT abdomen with increased size of the left ovarian cystic mass from 4.9 to 6.4 cm with Dr. Janese Banks consulted.  Patient to follow-up with GYN and GYN -ONC OUTPATIENT  6.  Multiple carious teeth with infection Patient is on IV Rocephin [phone] patient is to call and make an appointment after discharge  .  DVT prophylaxis: Heparin   GI prophylaxis: None  All the records are reviewed and case discussed with Care Management/Social Workerr. Management plans discussed with the patient, family and they are in agreement.  CODE STATUS: fc   TOTAL TIME TAKING CARE OF THIS PATIENT: 35  minutes.   POSSIBLE D/C IN 2  DAYS, DEPENDING ON CLINICAL CONDITION.  Note: This dictation was prepared with Dragon dictation along with smaller phrase technology. Any transcriptional errors that result from this process are unintentional.   Nicholes Mango M.D on 01/06/2019 at 2:49 PM  Between 7am to 6pm - Pager - [phone] After 6pm go to [email or web address] - password [email or web address] [facility]  [facility]  Office  [phone]  CC: Primary care physician; Kirk Ruths, MD

## 2019-01-07 LAB — CBC WITH DIFFERENTIAL/PLATELET
Abs Immature Granulocytes: 0.04 10*3/uL (ref 0.00–0.07)
Basophils Absolute: 0.1 10*3/uL (ref 0.0–0.1)
Basophils Relative: 1 %
Eosinophils Absolute: 0.1 10*3/uL (ref 0.0–0.5)
Eosinophils Relative: 1 %
HCT: 28 % — ABNORMAL LOW (ref 36.0–46.0)
Hemoglobin: 9.1 g/dL — ABNORMAL LOW (ref 12.0–15.0)
Immature Granulocytes: 1 %
Lymphocytes Relative: 5 %
Lymphs Abs: 0.5 10*3/uL — ABNORMAL LOW (ref 0.7–4.0)
MCH: 28.7 pg (ref 26.0–34.0)
MCHC: 32.5 g/dL (ref 30.0–36.0)
MCV: 88.3 fL (ref 80.0–100.0)
Monocytes Absolute: 0.9 10*3/uL (ref 0.1–1.0)
Monocytes Relative: 11 %
Neutro Abs: 6.8 10*3/uL (ref 1.7–7.7)
Neutrophils Relative %: 81 %
Platelets: 139 10*3/uL — ABNORMAL LOW (ref 150–400)
RBC: 3.17 MIL/uL — ABNORMAL LOW (ref 3.87–5.11)
RDW: 13.3 % (ref 11.5–15.5)
WBC: 8.4 10*3/uL (ref 4.0–10.5)
nRBC: 0 % (ref 0.0–0.2)

## 2019-01-07 LAB — BASIC METABOLIC PANEL
Anion gap: 9 (ref 5–15)
BUN: 14 mg/dL (ref 8–23)
CO2: 22 mmol/L (ref 22–32)
Calcium: 8.3 mg/dL — ABNORMAL LOW (ref 8.9–10.3)
Chloride: 108 mmol/L (ref 98–111)
Creatinine, Ser: 1.07 mg/dL — ABNORMAL HIGH (ref 0.44–1.00)
GFR calc Af Amer: >60 mL/min (ref >60)
GFR calc non Af Amer: 53 mL/min — ABNORMAL LOW (ref >60)
Glucose, Bld: 109 mg/dL — ABNORMAL HIGH (ref 70–99)
Potassium: 3.3 mmol/L — ABNORMAL LOW (ref 3.5–5.1)
Sodium: 139 mmol/L (ref 135–145)

## 2019-01-07 LAB — CULTURE, BLOOD (ROUTINE X 2)
Special Requests: ADEQUATE
Special Requests: ADEQUATE

## 2019-01-07 MED ORDER — AMLODIPINE BESYLATE 5 MG PO TABS
5.0000 mg | ORAL_TABLET | Freq: Every day | ORAL | Status: DC
  Administered 2019-01-07 – 2019-01-08 (×2): 5 mg via ORAL
  Filled 2019-01-07 (×2): qty 1

## 2019-01-07 MED ORDER — POTASSIUM CHLORIDE CRYS ER 20 MEQ PO TBCR
40.0000 meq | EXTENDED_RELEASE_TABLET | Freq: Once | ORAL | Status: AC
  Administered 2019-01-07: 40 meq via ORAL
  Filled 2019-01-07: qty 2

## 2019-01-07 MED ORDER — LISINOPRIL 20 MG PO TABS
20.0000 mg | ORAL_TABLET | Freq: Every day | ORAL | Status: DC
  Administered 2019-01-07 – 2019-01-08 (×2): 20 mg via ORAL
  Filled 2019-01-07: qty 1

## 2019-01-07 MED ORDER — HYDRALAZINE HCL 50 MG PO TABS
50.0000 mg | ORAL_TABLET | Freq: Three times a day (TID) | ORAL | Status: DC
  Administered 2019-01-07 – 2019-01-08 (×4): 50 mg via ORAL
  Filled 2019-01-07 (×4): qty 1

## 2019-01-07 NOTE — Progress Notes (Signed)
   Date of Admission:  01/04/2019       Subjective: Her main complaint is pain in her teeth. No fever Left leg pain better  Medications:  . amLODipine  5 mg Oral Daily  . clonazePAM  0.5 mg Oral QHS  . docusate sodium  100 mg Oral BID  . DULoxetine  60 mg Oral BH-q7a  . gabapentin  600 mg Oral TID  . heparin  5,000 Units Subcutaneous Q8H  . hydrALAZINE  50 mg Oral Q8H  . lisinopril  20 mg Oral Daily  . mirabegron ER  50 mg Oral Daily  . QUEtiapine Fumarate  150 mg Oral QHS  . tiZANidine  4 mg Oral TID    Objective: Vital signs in last 24 hours: Temp:  [98.4 F (36.9 C)-99.4 F (37.4 C)] 99.4 F (37.4 C) (09/10 0741) Pulse Rate:  [73-86] 86 (09/10 0741) Resp:  [18-20] 18 (09/10 0741) BP: (130-171)/(61-78) 171/75 (09/10 0741) SpO2:  [97 %-98 %] 97 % (09/10 0741)  PHYSICAL EXAM:  General: Alert, cooperative,  Head: Normocephalic, without obvious abnormality, atraumatic. Eyes: Conjunctivae clear, anicteric sclerae. Pupils are equal ENT Nares normal. No drainage or sinus tenderness. Oral cavity poor dentition.  Rotting teeth Lungs: Clear to auscultation bilaterally. No Wheezing or Rhonchi. No rales. Heart: Regular rate and rhythm, no murmur, rub or gallop. Abdomen: Soft, non-tender,not distended. Bowel sounds normal. No masses Extremities: Edema legs left more than right Erythema of the lower extremity on the left side.  Some tenderness on palpation  Lymph: Cervical, supraclavicular normal. Neurologic: Did not examine in detail Foley catheter Lab Results Recent Labs    01/06/19 0401 01/07/19 0642  WBC 6.3 8.4  HGB 9.5* 9.1*  HCT 29.1* 28.0*  NA 138 139  K 3.6 3.3*  CL 108 108  CO2 22 22  BUN 22 14  CREATININE 1.38* 1.07*   Liver Panel Recent Labs    01/04/19 2338 01/06/19 0401  PROT 8.1 6.3*  ALBUMIN 4.4 3.4*  AST 23 37  ALT 22 24  ALKPHOS 82 60  BILITOT 0.5 0.6   Sedimentation Rate No results for input(s): ESRSEDRATE in the last 72  hours. C-Reactive Protein No results for input(s): CRP in the last 72 hours.  Microbiology: Blood culture on admission group B streptococcus Studies/Results: Chest x-ray no infiltrate  Assessment/Plan:  Group B streptococcus bacteremia   On ceftriaxone.  She will need 10 more days of IV ceftriaxone.  Source for the group B streptococcus could be the left leg cellulitis versus Bad dentition. 2D echo has no vegetation. We will repeat blood cultures to make sure the group B strep has been cleared.  Neurogenic bladder has a chronic Foley catheter  MS on Aubagio followed by Dr. Brigitte Pulse as outpatient  Depression on quetiapine and duloxetine.  Discussed the management with the patient.  Will discuss with the husband.

## 2019-01-07 NOTE — Progress Notes (Signed)
[place] at [place] NAME: [name]    MR#:  [id number]  DATE OF BIRTH:  Sep 20, 1950  SUBJECTIVE:  CHIEF COMPLAINT: Patient is resting comfortably.  Worried about infection in her blood denies any nausea vomiting  REVIEW OF SYSTEMS:  CONSTITUTIONAL: No fever, fatigue or weakness.  EYES: No blurred or double vision.  EARS, NOSE, AND THROAT: No tinnitus or ear pain.  RESPIRATORY: No cough, shortness of breath, wheezing or hemoptysis.  CARDIOVASCULAR: No chest pain, orthopnea, edema.  GASTROINTESTINAL: No nausea, vomiting, diarrhea or abdominal pain.  GENITOURINARY: No dysuria, hematuria.  ENDOCRINE: No polyuria, nocturia,  HEMATOLOGY: No anemia, easy bruising or bleeding SKIN: No rash or lesion. MUSCULOSKELETAL: No joint pain or arthritis.   NEUROLOGIC: No tingling, numbness, weakness.  PSYCHIATRY: No anxiety or depression.   DRUG ALLERGIES:  No Known Allergies  VITALS:  Blood pressure (!) 171/75, pulse 86, temperature 99.4 F (37.4 C), temperature source Oral, resp. rate 18, height 5\' 6"  (1.676 m), weight (!) 137.6 kg, SpO2 97 %.  PHYSICAL EXAMINATION:  GENERAL:  68 y.o.-year-old patient lying in the bed with no acute distress.  EYES: Pupils equal, round, reactive to light and accommodation. No scleral icterus. Extraocular muscles intact.  HEENT: Head atraumatic, normocephalic. Oropharynx with carious teeth NECK:  Supple, no jugular venous distention. No thyroid enlargement, no tenderness.  LUNGS: Normal breath sounds bilaterally, no wheezing, rales,rhonchi or crepitation. No use of accessory muscles of respiration.  CARDIOVASCULAR: S1, S2 normal. No murmurs, rubs, or gallops.  ABDOMEN: Soft, nontender, nondistended. Bowel sounds present.  EXTREMITIES: No pedal edema, cyanosis, or clubbing.  NEUROLOGIC: Cranial nerves II through XII are intact. Muscle strength global weakness in all extremities. Sensation intact. Gait not  checked.  PSYCHIATRIC: The patient is alert and oriented x 3.  SKIN: No obvious rash, lesion, or ulcer.    LABORATORY PANEL:   CBC Recent Labs  Lab 01/07/19 0642  WBC 8.4  HGB 9.1*  HCT 28.0*  PLT 139*   ------------------------------------------------------------------------------------------------------------------  Chemistries  Recent Labs  Lab 01/06/19 0401 01/07/19 0642  NA 138 139  K 3.6 3.3*  CL 108 108  CO2 22 22  GLUCOSE 102* 109*  BUN 22 14  CREATININE 1.38* 1.07*  CALCIUM 8.3* 8.3*  AST 37  --   ALT 24  --   ALKPHOS 60  --   BILITOT 0.6  --    ------------------------------------------------------------------------------------------------------------------  Cardiac Enzymes No results for input(s): TROPONINI in the last 168 hours. ------------------------------------------------------------------------------------------------------------------  RADIOLOGY:  No results found.  EKG:   Orders placed or performed during the hospital encounter of 01/04/19  . ED EKG 12-Lead  . ED EKG 12-Lead  . ED EKG 12-Lead  . ED EKG 12-Lead  . EKG 12-Lead  . EKG 12-Lead    ASSESSMENT AND PLAN:     1.    Sepsis with group B streptococcal bacteremia present on admission the patient meets criteria for sepsis via tachycardia, leukocytosis and fever.   She is hemodynamically stable.   Blood cultures with Streptococcus  IV antibiotics Zosyn changed to 2 g of IV Rocephin.  Flagyl is added for anaerobic coverage in view of poor dentition ID  Dr. Levester Fresh is following Echocardiogram -with ejection fraction 60 to 65%   2.  UTI: Present on admission.  Is an indwelling Foley catheter.  Urine culture contaminated  We will continue Rocephin based on previous culture results and sensitivities.  3.  Acute kidney  injury: Clinically improving with IV fluids ,creatinine 1.84-1.38 1.07   Avoid nephrotoxic agents.   4.  Hypertension: Stable .  Continue current  medications and titrate as needed  5.  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 Janese Banks as an outpatient CT abdomen with increased size of the left ovarian cystic mass from 4.9 to 6.4 cm with Dr. Janese Banks consulted.  Patient to follow-up with GYN and GYN -ONC OUTPATIENT  6.  Multiple carious teeth with infection Patient is on IV Rocephin [phone] patient is to call and make an appointment after discharge  7.  Chronic history of multiple sclerosis, almost bedbound Sees Dr. Manuella Ghazi as an outpatient  .  DVT prophylaxis: Heparin   GI prophylaxis: None  All the records are reviewed and case discussed with Care Management/Social Workerr. Management plans discussed with the patient, family and they are in agreement.  CODE STATUS: fc   TOTAL TIME TAKING CARE OF THIS PATIENT: 35  minutes.   POSSIBLE D/C IN 1-2  DAYS, DEPENDING ON CLINICAL CONDITION.  Note: This dictation was prepared with Dragon dictation along with smaller phrase technology. Any transcriptional errors that result from this process are unintentional.   Nicholes Mango M.D on 01/07/2019 at 4:09 PM  Between 7am to 6pm - Pager - [phone] After 6pm go to [email or web address] - password [email or web address] [facility]  [place] [facility]  Office  [phone]  CC: Primary care physician; Kirk Ruths, MD

## 2019-01-07 NOTE — TOC Progression Note (Signed)
Transition of Care [facility]) - Progression Note    Patient Details  Name: [name] MRN: [id number] Date of Birth: [birth year]  Transition of Care [facility]) CM/SW [place], RN Phone Number: 01/07/2019, 11:35 AM  Clinical Narrative:    It was reported to the bedside nurse from the patient that she has no transportation to the doctor, her husband and family wont take her, I Provided the patient with a resource list to include transportation, housing and food banks if needed.  She accepted the list.  I explained that she can call and get transportation set up to the Doctor when needed.  I explained that some of the agencies may have a fee but she would need to call to find out if they do and which ones do  Expected Discharge Plan: [place] Barriers to Discharge: Continued Medical Work up  Expected Discharge Plan and Services Expected Discharge Plan: [place]   Discharge Planning Services: CM Consult   Living arrangements for the past 2 months: Single Family Home                 DME Arranged: N/A                     Social Determinants of Health (SDOH) Interventions    Readmission Risk Interventions No flowsheet data found.

## 2019-01-08 ENCOUNTER — Inpatient Hospital Stay: Payer: Self-pay

## 2019-01-08 LAB — CBC WITH DIFFERENTIAL/PLATELET
Abs Immature Granulocytes: 0.05 10*3/uL (ref 0.00–0.07)
Basophils Absolute: 0.1 10*3/uL (ref 0.0–0.1)
Basophils Relative: 1 %
Eosinophils Absolute: 0.2 10*3/uL (ref 0.0–0.5)
Eosinophils Relative: 3 %
HCT: 27.1 % — ABNORMAL LOW (ref 36.0–46.0)
Hemoglobin: 8.7 g/dL — ABNORMAL LOW (ref 12.0–15.0)
Immature Granulocytes: 1 %
Lymphocytes Relative: 11 %
Lymphs Abs: 0.6 10*3/uL — ABNORMAL LOW (ref 0.7–4.0)
MCH: 28.4 pg (ref 26.0–34.0)
MCHC: 32.1 g/dL (ref 30.0–36.0)
MCV: 88.6 fL (ref 80.0–100.0)
Monocytes Absolute: 0.7 10*3/uL (ref 0.1–1.0)
Monocytes Relative: 13 %
Neutro Abs: 4 10*3/uL (ref 1.7–7.7)
Neutrophils Relative %: 71 %
Platelets: 136 10*3/uL — ABNORMAL LOW (ref 150–400)
RBC: 3.06 MIL/uL — ABNORMAL LOW (ref 3.87–5.11)
RDW: 13.3 % (ref 11.5–15.5)
WBC: 5.7 10*3/uL (ref 4.0–10.5)
nRBC: 0 % (ref 0.0–0.2)

## 2019-01-08 LAB — BASIC METABOLIC PANEL
Anion gap: 6 (ref 5–15)
BUN: 9 mg/dL (ref 8–23)
CO2: 24 mmol/L (ref 22–32)
Calcium: 8.2 mg/dL — ABNORMAL LOW (ref 8.9–10.3)
Chloride: 111 mmol/L (ref 98–111)
Creatinine, Ser: 0.9 mg/dL (ref 0.44–1.00)
GFR calc Af Amer: >60 mL/min (ref >60)
GFR calc non Af Amer: >60 mL/min (ref >60)
Glucose, Bld: 96 mg/dL (ref 70–99)
Potassium: 3.4 mmol/L — ABNORMAL LOW (ref 3.5–5.1)
Sodium: 141 mmol/L (ref 135–145)

## 2019-01-08 MED ORDER — SODIUM CHLORIDE 0.9 % IV SOLN: 2.0000 g | INTRAVENOUS | 0 refills | Status: DC

## 2019-01-08 MED ORDER — METRONIDAZOLE 500 MG PO TABS: 500.0000 mg | ORAL_TABLET | Freq: Two times a day (BID) | ORAL | 0 refills | Status: AC

## 2019-01-08 MED ORDER — METRONIDAZOLE IN NACL 5-0.79 MG/ML-% IV SOLN: 500.0000 mg | Freq: Two times a day (BID) | INTRAVENOUS | 0 refills | Status: DC

## 2019-01-08 MED ORDER — CEFTRIAXONE IV (FOR PTA / DISCHARGE USE ONLY): 2.0000 g | INTRAVENOUS | 0 refills | Status: AC

## 2019-01-08 MED ORDER — POTASSIUM CHLORIDE 20 MEQ PO PACK
20.0000 meq | PACK | Freq: Once | ORAL | Status: AC
  Administered 2019-01-08: 20 meq via ORAL
  Filled 2019-01-08: qty 1

## 2019-01-08 MED ORDER — SODIUM CHLORIDE 0.9% FLUSH: 10.0000 mL | INTRAVENOUS | Status: DC | PRN

## 2019-01-08 MED ORDER — ACETAMINOPHEN 325 MG PO TABS: 650.0000 mg | ORAL_TABLET | Freq: Four times a day (QID) | ORAL | Status: DC | PRN

## 2019-01-08 MED ORDER — TRAMADOL HCL 50 MG PO TABS: 50.0000 mg | ORAL_TABLET | Freq: Four times a day (QID) | ORAL | 0 refills | Status: DC | PRN

## 2019-01-08 MED ORDER — SODIUM CHLORIDE 0.9% FLUSH: 10.0000 mL | Freq: Two times a day (BID) | INTRAVENOUS | Status: DC

## 2019-01-08 MED ORDER — DOCUSATE SODIUM 100 MG PO CAPS: 100.0000 mg | ORAL_CAPSULE | Freq: Two times a day (BID) | ORAL | 0 refills | Status: DC

## 2019-01-08 NOTE — Progress Notes (Signed)
Peripherally Inserted Central Catheter/Midline Placement  The IV Nurse has discussed with the patient and/or persons authorized to consent for the patient, the purpose of this procedure and the potential benefits and risks involved with this procedure.  The benefits include less needle sticks, lab draws from the catheter, and the patient may be discharged home with the catheter. Risks include, but not limited to, infection, bleeding, blood clot (thrombus formation), and puncture of an artery; nerve damage and irregular heartbeat and possibility to perform a PICC exchange if needed/ordered by physician.  Alternatives to this procedure were also discussed.  Bard Power PICC patient education guide, fact sheet on infection prevention and patient information card has been provided to patient /or left at bedside.    PICC/Midline Placement Documentation  PICC Single Lumen 01/08/19 PICC Right Cephalic 45 cm 0 cm (Active)  Indication for Insertion or Continuance of Line Home intravenous therapies (PICC only) 01/08/19 1600  Exposed Catheter (cm) 0 cm 01/08/19 1600  Site Assessment Clean;Dry;Intact 01/08/19 1600  Line Status Flushed;Saline locked;Blood return noted 01/08/19 1600  Dressing Type Transparent;Securing device 01/08/19 1600  Dressing Status Clean;Dry;Intact;Antimicrobial disc in place 01/08/19 1600  Dressing Change Due 01/15/19 01/08/19 1600       Holley Bouche [place] 01/08/2019, 4:04 PM

## 2019-01-08 NOTE — Progress Notes (Signed)
Patient states her niece will come and pick her up this pm.

## 2019-01-08 NOTE — Progress Notes (Signed)
PHARMACY CONSULT NOTE FOR:  OUTPATIENT  PARENTERAL ANTIBIOTIC THERAPY (OPAT)  Indication: Group B Streptococcus bacteremia Regimen: Ceftriaxone 2g IV Q24 hours, PO Flagyl 500mg  BID End date: 01/19/2019  IV antibiotic discharge orders are pended. To discharging provider:  please sign these orders via discharge navigator,  Select New Orders & click on the button choice - Manage This Unsigned Work.     Thank you for allowing pharmacy to be [name] part of this patient's care.  [name] [name] 01/08/2019, 11:06 AM

## 2019-01-08 NOTE — TOC Progression Note (Addendum)
Transition of Care [facility]) - Progression Note    Patient Details  Name: [name] MRN: [id number] Date of Birth: [birth year]  Transition of Care [facility]) CM/SW [place], RN Phone Number: 01/08/2019, 9:15 AM  Clinical Narrative:     Spoke with [place] at [facility], They are not able to provide nursing care, they can do PT if I can get Nursing elsewhere.  I reached out to [place] with [facility] to see if they could accept for both nursing and PT Awaiting an answer from [facility], [facility] does not have the staffing for Nursing and PT, I called Pam with [facility] to see if [facility] can do the Nursing part of it and [facility] can do the PT, she will check with billing to see if they can bill separately for the services. She will call me and let me know   Expected Discharge Plan: [place] Barriers to Discharge: Continued Medical Work up  Expected Discharge Plan and Services Expected Discharge Plan: [place]   Discharge Planning Services: CM Consult   Living arrangements for the past 2 months: Single Family Home                 DME Arranged: N/A                     Social Determinants of Health (SDOH) Interventions    Readmission Risk Interventions Readmission Risk Prevention Plan 01/07/2019  Transportation Screening Complete  PCP or Specialist Appt within 3-5 Days Complete  HRI or [place] Complete  Social Work Consult for [place] Planning/Counseling Complete  Palliative Care Screening Not Applicable  Medication Review Press photographer) Complete  Some recent data might be hidden

## 2019-01-08 NOTE — TOC Transition Note (Signed)
Transition of Care [facility]) - CM/SW Discharge Note   Patient Details  Name: [name] MRN: [id number] Date of Birth: [birth year]  Transition of Care [facility]) CM/SW Contact:  Su Hilt, RN Phone Number: 01/08/2019, 3:02 PM   Clinical Narrative:     Patient to discharge home with PICC line for IV ABX, [facility] is set up to go to the patient's home at 4 PM tomorrow 9/12 to do IV ABX infusion She is set up with [facility] PT thru [place] line team is placing PICC line now She has DME at home and does not need any additional  Final next level of care: Home w Home Health Services Barriers to Discharge: Barriers Resolved   Patient Goals and CMS Choice Patient states their goals for this hospitalization and ongoing recovery are:: go home      Discharge Placement                       Discharge Plan and Services   Discharge Planning Services: CM Consult            DME Arranged: N/A                    Social Determinants of Health (SDOH) Interventions     Readmission Risk Interventions Readmission Risk Prevention Plan 01/07/2019  Transportation Screening Complete  PCP or Specialist Appt within 3-5 Days Complete  HRI or Home Care Consult Complete  Social Work Consult for [place] Planning/Counseling Complete  Palliative Care Screening Not Applicable  Medication Review Press photographer) Complete  Some recent data might be hidden

## 2019-01-08 NOTE — Progress Notes (Signed)
Patient will be ready to discharge this pm around 1830, RN has placed 2 calls to husband to provided transportation and no call back. Case Manager has arranged for [facility] to be in the home in the am to teach administration of the IV abx.  Desirable for pt to d/c tonight as ordered.

## 2019-01-08 NOTE — Progress Notes (Signed)
PT Cancellation Note  Patient Details Name: [name] MRN: [id number] DOB: 22-Oct-1950   Cancelled Treatment:    Reason Eval/Treat Not Completed: Patient at procedure or test/unavailable(Consult received and chart reviewed.  Patient currently in sterile procedure (receiving PICC line).  Will re-attempt at later time/date as patient medically appropriate and available.  Of note, patient noted already set up for [facility] services at discharge; scheduled to come for first appointment 9/12.)   Jillien Yakel H. Owens Shark, PT, DPT, NCS 01/08/19, 3:40 PM [phone]

## 2019-01-08 NOTE — Progress Notes (Signed)
Patient will discharge this pm and be transported home by family.  All d/c instructions reviewed with patient. Patient will be discharging home with newly placed PICC line for extended IV therapy. All personal belongings with patient.  Patient displays no distress at this time.

## 2019-01-08 NOTE — Care Management Important Message (Signed)
Important Message  Patient Details  Name: [name] MRN: [id number] Date of Birth: [birth year]   Medicare Important Message Given:  Yes     Juliann Pulse A Raghad Lorenz 01/08/2019, 10:44 AM

## 2019-01-08 NOTE — Discharge Summary (Signed)
[facility] NAME: [name]    MR#:  [id number]  DATE OF BIRTH:  01-Apr-1951  DATE OF ADMISSION:  01/04/2019 ADMITTING PHYSICIAN: Harrie Foreman, MD  DATE OF DISCHARGE: 01/08/19   PRIMARY CARE PHYSICIAN: Kirk Ruths, MD    ADMISSION DIAGNOSIS:  AKI (acute kidney injury) ([place]) [N17.9] Sepsis secondary to UTI ([place]) [A41.9, N39.0]  DISCHARGE DIAGNOSIS:  Active Problems:   Sepsis ([place])   SECONDARY DIAGNOSIS:   Past Medical History:  Diagnosis Date  . Abdominal aortic atherosclerosis ([place]) 11/11/2016  . ADHD   . Anxiety   . COPD (chronic obstructive pulmonary disease) ([place])   . Depression    major depressive  . Dyspnea    doe  . Edema    left leg  . Follicular lymphoma ([place])    B Cell  . Follicular lymphoma grade II ([place])   . Hypertension   . Hypotension    idiopathic  . Kyphoscoliosis and scoliosis 11/26/2011  . Morbid obesity ([place]) 01/05/2016  . Multiple sclerosis ([place])   . Multiple sclerosis ([place])    1980's  . Neuromuscular disorder ([place])   . Obstructive and reflux uropathy    foley  . Pain    atypical facial  . Peripheral vascular disease of lower extremity with ulceration ([place]) 11/08/2015  . Skin ulcer ([place]) 11/08/2015  . Weakness    generalized. has MS    HOSPITAL COURSE:  HPI: The patient with past medical history of multiple sclerosis as well as follicular lymphoma and COPD presents to the emergency department complaining of subjective fever.  She is also had nausea but no vomiting.  She reports that she has been feeling unwell 4 days but that she became acutely weaker yesterday. No travel risk factors or known contact with individuals with exposure to novel coronavirus.  ED work-up significant for urinalysis consistent with infection.  Renal function also slightly worsened.  The patient was given cefepime, vancomycin and metronidazole in the emergency department prior to the hospitalist  service being called for admission.  1.   Sepsis with group B streptococcal bacteremia present on admission the patient meets criteria for sepsis via tachycardia, leukocytosis and fever.  She is hemodynamically stable.  Blood cultures with Streptococcus  IV antibiotics Zosyn changed to 2 g of IV Rocephin.  Flagyl is added for anaerobic coverage in view of poor dentition ID  Dr. Levester Fresh is  recommending PICC line placement and 10 more days of IV Rocephin and Flagyl 500 twice a day for 10 more days Echocardiogram -with ejection fraction 60 to 65% , no vegetation Repeat blood cultures with no growth so far Okay to discharge patient from Dr. Levester Fresh standpoint  2. UTI: Present on admission. Is an indwelling Foley catheter.  Urine culture contaminated We will continue Rocephin based on previous culture results and sensitivities.  3. Acute kidney injury: Clinically improving with IV fluids ,creatinine 1.84-1.38 1.07  Avoid nephrotoxic agents.   4. Hypertension: Stable .  Continue current medications and titrate as needed  5.  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 Janese Banks as an outpatient CT abdomen with increased size of the left ovarian cystic mass from 4.9 to 6.4 cm with Dr. Janese Banks consulted.  Patient to follow-up with GYN and GYN [facility] OUTPAT[facility]  6.  Multiple carious teeth with infection Patient is on IV Rocephin [phone] patient is  to call and make an appointment after discharge  7.  Chronic history of multiple sclerosis, almost bedbound Sees Dr. Manuella Ghazi as an outpatient  . DVT prophylaxis: Heparin DISCHARGE CONDITIONS:   STABLE   CONSULTS OBTAINED:  Treatment Team:  Tsosie Billing, MD   PROCEDURES picc line   DRUG ALLERGIES:  No Known Allergies  DISCHARGE MEDICATIONS:   Allergies as of 01/08/2019   No Known Allergies     Medication List     STOP taking these medications   baclofen 10 MG tablet Commonly known as: LIORESAL   buPROPion 300 MG 24 hr tablet Commonly known as: WELLBUTRIN XL   levofloxacin 500 MG tablet Commonly known as: LEVAQUIN   methylphenidate 10 MG tablet Commonly known as: RITALIN   potassium chloride 10 MEQ tablet Commonly known as: Klor-Con 10     TAKE these medications   acetaminophen 325 MG tablet Commonly known as: TYLENOL Take 2 tablets (650 mg total) by mouth every 6 (six) hours as needed for mild pain (or Fever >/= 101).   albuterol 108 (90 Base) MCG/ACT inhaler Commonly known as: VENTOLIN HFA Inhale 1 puff into the lungs every 4 (four) hours as needed for shortness of breath.   amLODipine 5 MG tablet Commonly known as: NORVASC Take 5 mg by mouth daily.   BIOTIN PO Take by mouth daily. Pt taking 500 mcg daily   cefTRIAXone  IVPB Commonly known as: ROCEPHIN Inject 2 g into the vein daily for 11 days. Indication:  Group B Streptococcus bacteremia Last Day of Therapy:  01/19/2019 Labs - Once weekly(Mondays):  CBC/D and BMP,  PIC Care Per Protocol:including placement of biopatch  _X_ Please pull PIC at completion of IV antibiotics  Fax weekly labs to [phone]   clonazePAM 1 MG tablet Commonly known as: KLONOPIN Take 0.5 tablets (0.5 mg total) by mouth at bedtime.   docusate sodium 100 MG capsule Commonly known as: COLACE Take 1 capsule (100 mg total) by mouth 2 (two) times daily.   DULoxetine 60 MG capsule Commonly known as: CYMBALTA Take 1 capsule (60 mg total) by mouth every morning.   gabapentin 600 MG tablet Commonly known as: NEURONTIN Take 1 tablet (600 mg total) by mouth 3 (three) times daily.   hydrALAZINE 50 MG tablet Commonly known as: APRESOLINE Take 50 mg by mouth 3 (three) times daily.   interferon beta-1a 30 MCG/0.5ML Pskt injection Commonly known as: AVONEX Inject 30 mcg into the muscle every Monday.   lisinopril 20 MG tablet Commonly  known as: ZESTRIL Take 20 mg by mouth daily.   metroNIDAZOLE 500 MG tablet Commonly known as: FLAGYL Take 1 tablet (500 mg total) by mouth 2 (two) times daily for 11 days.   multivitamin with minerals Tabs tablet Take 1 tablet by mouth every evening.   Myrbetriq 50 MG Tb24 tablet Generic drug: mirabegron ER TAKE ONE TABLET BY MOUTH ONCE DAILY (FOR OVERACTIVE BLADDER)   polyethylene glycol powder 17 GM/SCOOP powder Commonly known as: GLYCOLAX/MIRALAX Take 17 g by mouth daily as needed for mild constipation. Mixed in water What changed: additional instructions   QUEtiapine Fumarate 150 MG 24 hr tablet Commonly known as: SEROQUEL XR Take 150 mg by mouth at bedtime.   Symbicort 160-4.5 MCG/ACT inhaler Generic drug: budesonide-formoterol Inhale 2 puffs into the lungs 2 (two) times daily.   tiZANidine 4 MG tablet Commonly known as: ZANAFLEX Take 4 mg by mouth 3 (three) times daily.   traMADol 50 MG tablet Commonly known as:  ULTRAM Take 1 tablet (50 mg total) by mouth every 6 (six) hours as needed for up to 20 doses for moderate pain.   vitamin C 500 MG tablet Commonly known as: ASCORBIC ACID Take 500 mg by mouth every evening.            Home Infusion Instuctions  (From admission, onward)         Start     Ordered   01/08/19 0000  Home infusion instructions [facility] May follow [facility] Dosing Protocol; May administer Cathflo as needed to maintain patency of vascular access device.; Flushing of vascular access device: per [facility] Protocol: 0.9% NaCl pre/post medica...    Question Answer Comment  Instructions May follow [place] Dosing Protocol   Instructions May administer Cathflo as needed to maintain patency of vascular access device.   Instructions Flushing of vascular access device: per [facility] Protocol: 0.9% NaCl pre/post medication administration and prn patency; Heparin 100 u/ml, 60ml for implanted ports and Heparin 10u/ml, 86ml for all other central  venous catheters.   Instructions May follow [facility] Anaphylaxis Protocol for First Dose Administration in the home: 0.9% NaCl at 25-50 ml/hr to maintain IV access for protocol meds. Epinephrine 0.3 ml IV/IM PRN and Benadryl 25-50 IV/IM PRN s/s of anaphylaxis.   Instructions [facility] Infusion Coordinator (RN) to assist per patient IV care needs in the home PRN.      01/08/19 1617           DISCHARGE INSTRUCTIONS:  F/u with pcp in 3-4 days Continue home health PICC line care, labs per infectious disease   DIET:  Cardiac diet  DISCHARGE CONDITION:  Fair  ACTIVITY:  Activity as tolerated  OXYGEN:  Home Oxygen: Yes.     Oxygen Delivery: 2 liters/min via Patient connected to nasal cannula oxygen  DISCHARGE LOCATION2  home   If you experience worsening of your admission symptoms, develop shortness of breath, life threatening emergency, suicidal or homicidal thoughts you must seek medical attention immediately by calling 911 or calling your MD immediately  if symptoms less severe.  You Must read complete instructions/literature along with all the possible adverse reactions/side effects for all the Medicines you take and that have been prescribed to you. Take any new Medicines after you have completely understood and accpet all the possible adverse reactions/side effects.   Please note  You were cared for by a hospitalist during your hospital stay. If you have any questions about your discharge medications or the care you received while you were in the hospital after you are discharged, you can call the unit and asked to speak with the hospitalist on call if the hospitalist that took care of you is not available. Once you are discharged, your primary care physician will handle any further medical issues. Please note that NO REFILLS for any discharge medications will be authorized once you are discharged, as it is imperative that you return to your primary care physician (or  establish a relationship with a primary care physician if you do not have one) for your aftercare needs so that they can reassess your need for medications and monitor your lab values.     Today  Chief Complaint  Patient presents with  . Fever   Pt is doing fine , no complaints wants to be discharged home okay to discharge patient from ID standpoint  ROS:  CONSTITUTIONAL: Denies fevers, chills. Denies any fatigue, weakness.  EYES: Denies blurry vision, double vision, eye  pain. EARS, NOSE, THROAT: Denies tinnitus, ear pain, hearing loss. RESPIRATORY: Denies cough, wheeze, shortness of breath.  CARDIOVASCULAR: Denies chest pain, palpitations, edema.  GASTROINTESTINAL: Denies nausea, vomiting, diarrhea, abdominal pain. Denies bright red blood per rectum. GENITOURINARY: Denies dysuria, hematuria. ENDOCRINE: Denies nocturia or thyroid problems. HEMATOLOGIC AND LYMPHATIC: Denies easy bruising or bleeding. SKIN: Denies rash or lesion. MUSCULOSKELETAL: CHRONIC LOWER EXTREMITY WEAKNESS NEUROLOGIC: Denies paralysis, paresthesias.  PSYCHIATRIC: Denies anxiety or depressive symptoms.   VITAL SIGNS:  Blood pressure (!) 133/54, pulse 90, temperature 98.8 F (37.1 C), temperature source Oral, resp. rate 17, height 5\' 6"  (1.676 m), weight (!) 142.1 kg, SpO2 99 %.  I/O:    Intake/Output Summary (Last 24 hours) at 01/08/2019 1618 Last data filed at 01/08/2019 1500 Gross per 24 hour  Intake 500 ml  Output 1250 ml  Net -750 ml    PHYSICAL EXAMINATION:  GENERAL:  68 y.o.-year-old patient lying in the bed with no acute distress.  EYES: Pupils equal, round, reactive to light and accommodation. No scleral icterus. Extraocular muscles intact.  HEENT: Head atraumatic, normocephalic. Oropharynx and nasopharynx clear.  NECK:  Supple, no jugular venous distention. No thyroid enlargement, no tenderness.  LUNGS: Normal breath sounds bilaterally, no wheezing, rales,rhonchi or crepitation. No use of  accessory muscles of respiration.  CARDIOVASCULAR: S1, S2 normal. No murmurs, rubs, or gallops.  ABDOMEN: Soft, non-tender, non-distended. Bowel sounds present.  EXTREMITIES: No pedal edema, cyanosis, or clubbing.  NEUROLOGIC: Awake alert and oriented x3 gait not checked.  PSYCHIATRIC: The patient is alert and oriented x 3.  SKIN: No obvious rash, lesion, or ulcer.   DATA REVIEW:   CBC Recent Labs  Lab 01/08/19 0551  WBC 5.7  HGB 8.7*  HCT 27.1*  PLT 136*    Chemistries  Recent Labs  Lab 01/06/19 0401  01/08/19 0551  NA 138   < > 141  K 3.6   < > 3.4*  CL 108   < > 111  CO2 22   < > 24  GLUCOSE 102*   < > 96  BUN 22   < > 9  CREATININE 1.38*   < > 0.90  CALCIUM 8.3*   < > 8.2*  AST 37  --   --   ALT 24  --   --   ALKPHOS 60  --   --   BILITOT 0.6  --   --    < > = values in this interval not displayed.    Cardiac Enzymes No results for input(s): TROPONINI in the last 168 hours.  Microbiology Results  Results for orders placed or performed during the hospital encounter of 01/04/19  Blood Culture (routine x 2)     Status: Abnormal   Collection Time: 01/04/19 11:38 PM   Specimen: BLOOD  Result Value Ref Range Status   Specimen Description   Final    BLOOD LEFT ANTECUBITAL Performed at [facility], [address], [place], [place] [address]    Special Requests   Final    BOTTLES DRAWN AEROBIC AND ANAEROBIC Blood Culture adequate volume Performed at [facility], [address] [place], [place] [address]    Culture  Setup Time   Final    GRAM POSITIVE COCCI AEROBIC BOTTLE ONLY CRITICAL VALUE NOTED.  VALUE IS CONSISTENT WITH PREVIOUSLY REPORTED AND CALLED VALUE. Performed at [facility], [address] [place], [place] [address]    Culture (A)  Final    GROUP B STREP(S.AGALACTIAE)ISOLATED  SUSCEPTIBILITIES PERFORMED ON PREVIOUS CULTURE WITHIN THE LAST 5 DAYS. Performed at [facility], [place] [address] [name],  [place] [address]    Report Status 01/07/2019 FINAL  Final  Urine culture     Status: Abnormal   Collection Time: 01/04/19 11:38 PM   Specimen: In/Out Cath Urine  Result Value Ref Range Status   Specimen Description   Final    IN/OUT CATH URINE Performed at [facility], [address], [place], [place] [address]    Special Requests   Final    NONE Performed at [facility], [address] [place], [place] [address]    Culture MULTIPLE SPECIES PRESENT, SUGGEST RECOLLECTION (A)  Final   Report Status 01/06/2019 FINAL  Final  Blood Culture (routine x 2)     Status: Abnormal   Collection Time: 01/04/19 11:40 PM   Specimen: BLOOD  Result Value Ref Range Status   Specimen Description   Final    BLOOD RIGHT ANTECUBITAL Performed at [facility], [address] [place], [place] [address]    Special Requests   Final    BOTTLES DRAWN AEROBIC AND ANAEROBIC Blood Culture adequate volume Performed at [facility], [address] [place], [place] [address]    Culture  Setup Time   Final    IN BOTH AEROBIC AND ANAEROBIC BOTTLES GRAM POSITIVE COCCI CRITICAL RESULT CALLED TO, READ BACK BY AND VERIFIED WITH: SHEEMA HALLAJI AT [id number] ON 01/05/2019 JJB Performed at [facility], [place] [address], [place], [place] [address]    Culture GROUP B STREP(S.AGALACTIAE)ISOLATED (A)  Final   Report Status 01/07/2019 FINAL  Final   Organism ID, Bacteria GROUP B STREP(S.AGALACTIAE)ISOLATED  Final      Susceptibility   Group b strep(s.agalactiae)isolated - MIC*    CLINDAMYCIN <=0.25 SENSITIVE Sensitive     AMPICILLIN <=0.25 SENSITIVE Sensitive     ERYTHROMYCIN <=0.12 SENSITIVE Sensitive     VANCOMYCIN 0.5 SENSITIVE Sensitive     CEFTRIAXONE <=0.12 SENSITIVE Sensitive     LEVOFLOXACIN 0.5 SENSITIVE Sensitive     * GROUP B STREP(S.AGALACTIAE)ISOLATED  Blood Culture ID Panel (Reflexed)     Status: Abnormal   Collection Time: 01/04/19 11:40 PM  Result Value Ref Range  Status   Enterococcus species NOT DETECTED NOT DETECTED Final   Listeria monocytogenes NOT DETECTED NOT DETECTED Final   Staphylococcus species NOT DETECTED NOT DETECTED Final   Staphylococcus aureus (BCID) NOT DETECTED NOT DETECTED Final   Streptococcus species DETECTED (A) NOT DETECTED Final    Comment: CRITICAL RESULT CALLED TO, READ BACK BY AND VERIFIED WITH: SHEEMA HALLAJI AT 1432 ON 01/05/2019 JJB    Streptococcus agalactiae DETECTED (A) NOT DETECTED Final    Comment: CRITICAL RESULT CALLED TO, READ BACK BY AND VERIFIED WITH: SHEEMA HALLAJI AT [id number] ON 01/05/2019 JJB    Streptococcus pneumoniae NOT DETECTED NOT DETECTED Final   Streptococcus pyogenes NOT DETECTED NOT DETECTED Final   Acinetobacter baumannii NOT DETECTED NOT DETECTED Final   Enterobacteriaceae species NOT DETECTED NOT DETECTED Final   Enterobacter cloacae complex NOT DETECTED NOT DETECTED Final   Escherichia coli NOT DETECTED NOT DETECTED Final   Klebsiella oxytoca NOT DETECTED NOT DETECTED Final   Klebsiella pneumoniae NOT DETECTED NOT DETECTED Final   Proteus species NOT DETECTED NOT DETECTED Final   Serratia marcescens NOT DETECTED NOT DETECTED Final   Haemophilus influenzae NOT DETECTED NOT DETECTED Final   Neisseria meningitidis NOT DETECTED NOT DETECTED Final  Pseudomonas aeruginosa NOT DETECTED NOT DETECTED Final   Candida albicans NOT DETECTED NOT DETECTED Final   Candida glabrata NOT DETECTED NOT DETECTED Final   Candida krusei NOT DETECTED NOT DETECTED Final   Candida parapsilosis NOT DETECTED NOT DETECTED Final   Candida tropicalis NOT DETECTED NOT DETECTED Final    Comment: Performed at [facility], [address] [place], [place] [address]  SARS Coronavirus 2 [facility] order, Performed in [facility] hospital lab) Nasopharyngeal Nasopharyngeal Swab     Status: None   Collection Time: 01/05/19 12:31 AM   Specimen: Nasopharyngeal Swab  Result Value Ref Range Status   SARS Coronavirus 2  NEGATIVE NEGATIVE Final    Comment: (NOTE) If result is NEGATIVE SARS-CoV-2 target nucleic acids are NOT DETECTED. The SARS-CoV-2 RNA is generally detectable in upper and lower  respiratory specimens during the acute phase of infection. The lowest  concentration of SARS-CoV-2 viral copies this assay can detect is 250  copies / mL. A negative result does not preclude SARS-CoV-2 infection  and should not be used as the sole basis for treatment or other  patient management decisions.  A negative result may occur with  improper specimen collection / handling, submission of specimen other  than nasopharyngeal swab, presence of viral mutation(s) within the  areas targeted by this assay, and inadequate number of viral copies  (<250 copies / mL). A negative result must be combined with clinical  observations, patient history, and epidemiological information. If result is POSITIVE SARS-CoV-2 target nucleic acids are DETECTED. The SARS-CoV-2 RNA is generally detectable in upper and lower  respiratory specimens dur ing the acute phase of infection.  Positive  results are indicative of active infection with SARS-CoV-2.  Clinical  correlation with patient history and other diagnostic information is  necessary to determine patient infection status.  Positive results do  not rule out bacterial infection or co-infection with other viruses. If result is PRESUMPTIVE POSTIVE SARS-CoV-2 nucleic acids MAY BE PRESENT.   A presumptive positive result was obtained on the submitted specimen  and confirmed on repeat testing.  While 2019 novel coronavirus  (SARS-CoV-2) nucleic acids may be present in the submitted sample  additional confirmatory testing may be necessary for epidemiological  and / or clinical management purposes  to differentiate between  SARS-CoV-2 and other Sarbecovirus currently known to infect humans.  If clinically indicated additional testing with an alternate test  methodology [phone])  is advised. The SARS-CoV-2 RNA is generally  detectable in upper and lower respiratory sp ecimens during the acute  phase of infection. The expected result is Negative. Fact Sheet for Patients:  StrictlyIdeas.no Fact Sheet for Healthcare Providers: BankingDealers.co.za This test is not yet approved or cleared by the [place] FDA and has been authorized for detection and/or diagnosis of SARS-CoV-2 by FDA under an Emergency Use Authorization (EUA).  This EUA will remain in effect (meaning this test can be used) for the duration of the COVID-19 declaration under Section 564(b)(1) of the Act, 21 U.S.C. section 360bbb-3(b)(1), unless the authorization is terminated or revoked sooner. Performed at [facility], [address] [place], [place] [address]   CULTURE, BLOOD (ROUTINE X 2) w Reflex to ID Panel     Status: None (Preliminary result)   Collection Time: 01/07/19  6:05 PM   Specimen: BLOOD  Result Value Ref Range Status   Specimen Description BLOOD BLOOD RIGHT HAND  Final   Special Requests   Final    BOTTLES DRAWN AEROBIC  AND ANAEROBIC Blood Culture adequate volume   Culture   Final    NO GROWTH < 12 HOURS Performed at [facility], [address] [place], [place] [address]    Report Status PENDING  Incomplete  CULTURE, BLOOD (ROUTINE X 2) w Reflex to ID Panel     Status: None (Preliminary result)   Collection Time: 01/07/19  7:24 PM   Specimen: BLOOD  Result Value Ref Range Status   Specimen Description BLOOD BLOOD LEFT HAND  Final   Special Requests   Final    BOTTLES DRAWN AEROBIC ONLY Blood Culture adequate volume   Culture   Final    NO GROWTH < 12 HOURS Performed at [facility], [address], [place], [place] [address]    Report Status PENDING  Incomplete    RADIOLOGY:  Ct Abdomen Pelvis Wo Contrast  Result Date: 01/05/2019 CLINICAL DATA:  Fever, chills, abdominal distention,  nausea, vomiting EXAM: CT ABDOMEN AND PELVIS WITHOUT CONTRAST TECHNIQUE: Multidetector CT imaging of the abdomen and pelvis was performed following the standard protocol without IV contrast. COMPARISON:  11/05/2016 FINDINGS: Lower chest: Lung bases are clear. No effusions. Heart is normal size. Hepatobiliary: No focal hepatic abnormality. Gallbladder unremarkable. Pancreas: No focal abnormality or ductal dilatation. Spleen: No focal abnormality.  Normal size. Adrenals/Urinary Tract: Foley catheter within the bladder. No hydronephrosis. Adrenal glands unremarkable. Stomach/Bowel: Gaseous distension of the transverse colon with moderate stool burden. Small bowel decompressed. Stomach unremarkable. Vascular/Lymphatic: Aortic atherosclerosis. No evidence of aneurysm or adenopathy. Reproductive: Left ovarian cystic mass again noted measuring 6.4 x 4.9 cm compared to 4.9 x 4.3 cm previously. No right adnexal mass. Other: No free fluid or free air. Musculoskeletal: No acute bony abnormality. Postoperative changes in the lumbar spine. Severe degenerative disc disease throughout the lumbar spine. Moderate compression deformity at multiple levels in the lumbar spine are stable. IMPRESSION: Gaseous distention of the transverse colon. No evidence of bowel obstruction. This may reflect mild ileus. Aortic atherosclerosis. Enlarging left ovarian cystic mass now measuring up to 6.4 cm compared to 4.9 cm previously. This could be further characterized with elective pelvic ultrasound or MRI. Electronically Signed   By: Rolm Baptise M.D.   On: 01/05/2019 02:20   Dg Chest Port 1 View  Result Date: 01/04/2019 CLINICAL DATA:  Fever EXAM: PORTABLE CHEST 1 VIEW COMPARISON:  05/13/2018 FINDINGS: Heart and mediastinal contours are within normal limits. No focal opacities or effusions. No acute bony abnormality. IMPRESSION: No active disease. Electronically Signed   By: Rolm Baptise M.D.   On: 01/04/2019 23:58   [place] Ekg Site  Rite  Result Date: 01/08/2019 If Site Rite image not attached, placement could not be confirmed due to current cardiac rhythm.   EKG:   Orders placed or performed during the hospital encounter of 01/04/19  . ED EKG 12-Lead  . ED EKG 12-Lead  . ED EKG 12-Lead  . ED EKG 12-Lead  . EKG 12-Lead  . EKG 12-Lead      Management plans discussed with the patient, family and they are in agreement.  CODE STATUS:     Code Status Orders  (From admission, onward)         Start     Ordered   01/05/19 0648  Full code  Continuous     01/05/19 0647        Code Status History    Date Active Date Inactive Code Status Order ID Comments User Context   06/11/2018 2158  06/14/2018 1807 Full Code [id number]  Saundra Shelling, MD Inpatient   07/07/2017 2000 07/08/2017 1631 Full Code [id number]  Dustin Flock, MD Inpatient   05/14/2017 0152 05/15/2017 1953 Full Code [id number]  Lance Coon, MD Inpatient   04/01/2017 1723 04/05/2017 1742 Full Code [id number]  Daylene Posey, RN Inpatient   02/15/2017 1715 02/17/2017 2008 Full Code [id number]  Henreitta Leber, MD Inpatient   02/06/2017 2356 02/09/2017 1858 Full Code [id number]  Dustin Flock, MD Inpatient   01/02/2017 1903 01/06/2017 1848 Full Code [id number]  [name], MD ED   12/29/2016 1252 12/30/2016 1722 Full Code [id number]  Nicholes Mango, MD Inpatient   10/11/2016 2320 10/14/2016 1726 Full Code [id number]  Theodoro Grist, MD Inpatient   08/28/2016 1159 08/30/2016 1932 Full Code [id number]  Loletha Grayer, MD ED   12/18/2015 1956 12/19/2015 1939 Full Code [id number]  Hower, Aaron Mose, MD ED   Advance Care Planning Activity      TOTAL TIME TAKING CARE OF THIS PATIENT: 45 minutes.   Note: This dictation was prepared with Dragon dictation along with smaller phrase technology. Any transcriptional errors that result from this process are unintentional.   @MEC @  on 01/08/2019 at 4:18 PM  Between 7am to 6pm - Pager - [phone]  After 6pm go to [email or web address]  - password [email or web address] [facility]  [facility]  Office  [phone]  CC: Primary care physician; Kirk Ruths, MD

## 2019-01-08 NOTE — Discharge Instructions (Signed)
F/u with pcp in 3-4 days Continue home health PICC line care, labs per infectious disease

## 2019-01-08 NOTE — Progress Notes (Signed)
   Date of Admission:  01/04/2019   T   Subjective: Feeling better Left leg pain better Says she stubbed her toes on the left foot last week and it bled No fever  Medications:  . amLODipine  5 mg Oral Daily  . clonazePAM  0.5 mg Oral QHS  . docusate sodium  100 mg Oral BID  . DULoxetine  60 mg Oral BH-q7a  . gabapentin  600 mg Oral TID  . heparin  5,000 Units Subcutaneous Q8H  . hydrALAZINE  50 mg Oral Q8H  . lisinopril  20 mg Oral Daily  . mirabegron ER  50 mg Oral Daily  . QUEtiapine Fumarate  150 mg Oral QHS  . sodium chloride flush  10-40 mL Intracatheter Q12H  . tiZANidine  4 mg Oral TID    Objective: Vital signs in last 24 hours: Temp:  [98.3 F (36.8 C)-98.8 F (37.1 C)] 98.8 F (37.1 C) (09/11 0914) Pulse Rate:  [79-90] 90 (09/11 0914) Resp:  [17-20] 17 (09/11 0914) BP: (133-138)/(51-70) 133/54 (09/11 0914) SpO2:  [97 %-99 %] 99 % (09/11 0914) Weight:  [142.1 kg] 142.1 kg (09/11 0505)  PHYSICAL EXAM:  General: Alert, cooperative, no distress, appears stated age.  Head: Normocephalic, without obvious abnormality, atraumatic. Eyes: Conjunctivae clear, anicteric sclerae. Pupils are equal Poor dentition Neck: Supple, symmetrical, no adenopathy, thyroid: non tender no carotid bruit and no JVD. Lungs: Clear to auscultation bilaterally. No Wheezing or Rhonchi. No rales. Heart: Regular rate and rhythm, no murmur, rub or gallop. Abdomen: Soft, non-tender,not distended. Bowel sounds normal. No masses, foley  Extremities: left great and 2nd toe has a healing wound Left leg erythema better than before No tenderness        Skin: No rashes or lesions. Or bruising Lymph: Cervical, supraclavicular normal. Neurologic: Grossly non-focal  Lab Results Recent Labs    01/07/19 0642 01/08/19 0551  WBC 8.4 5.7  HGB 9.1* 8.7*  HCT 28.0* 27.1*  NA 139 141  K 3.3* 3.4*  CL 108 111  CO2 22 24  BUN 14 9  CREATININE 1.07* 0.90   Liver Panel Recent Labs   01/06/19 0401  PROT 6.3*  ALBUMIN 3.4*  AST 37  ALT 24  ALKPHOS 60  BILITOT 0.6   Sedimentation Rate No results for input(s): ESRSEDRATE in the last 72 hours. C-Reactive Protein No results for input(s): CRP in the last 72 hours.  Microbiology:  Studies/Results: [place] Ekg Site Rite  Result Date: 01/08/2019 If Site Rite image not attached, placement could not be confirmed due to current cardiac rhythm.    Assessment/Plan:  Group B streptococcus bacteremia- likely source left toes Cellulitis left leg Also has very bad teeth/gums  On ceftiraxone for 10 more days Iv with PO flagyl  2 d echo no vegetation and repeat culture neg so far  Neurogenic bladder- has a chronic foley catheter MS  Discussed the management with the patient- called her husband but could not reach him. Discussed with hospitalist

## 2019-01-08 NOTE — Progress Notes (Signed)
Patient states she has a sister she can call to see if she will come and pick her up after IV abx infuses.  Patient states"somebody will come and get me".  SW made aware of impending transportation issue.

## 2019-01-08 NOTE — TOC Progression Note (Signed)
Transition of Care [facility]) - Progression Note    Patient Details  Name: [name] MRN: [id number] Date of Birth: 12/09/1950  Transition of Care [facility]) CM/SW [place], RN Phone Number: 01/08/2019, 9:39 AM  Clinical Narrative:    Spoke with [place] at [facility] she accepted the patient for PT, [facility] home infusion to provide the IV ABX infusion and [facility] is to provide the PICC line care.   Expected Discharge Plan: [place] Barriers to Discharge: Continued Medical Work up  Expected Discharge Plan and Services Expected Discharge Plan: [place]   Discharge Planning Services: CM Consult   Living arrangements for the past 2 months: Single Family Home                 DME Arranged: N/A                     Social Determinants of Health (SDOH) Interventions    Readmission Risk Interventions Readmission Risk Prevention Plan 01/07/2019  Transportation Screening Complete  PCP or Specialist Appt within 3-5 Days Complete  HRI or [place] Complete  Social Work Consult for [place] Planning/Counseling Complete  Palliative Care Screening Not Applicable  Medication Review Press photographer) Complete  Some recent data might be hidden

## 2019-01-08 NOTE — Treatment Plan (Signed)
Diagnosis: Group B Streptococcus bacteremia Baseline Creatinine 0.9    No Known Allergies  OPAT Orders Discharge antibiotics: Ceftriaxone 2 grams Iv every 24 hours until 01/19/19 PO flagyl 500mg  BID until 01/19/19  [facility] Care Per Protocol:including placement of biopatch  Labs Monday weekly while on IV antibiotics: X__ CBC with differential _X_ CMP  _X_ Please pull PIC at completion of IV antibiotics   Fax weekly labs to [phone]  Clinic Follow Up Appt:none  Call [phone] if any questions or concern

## 2019-01-10 ENCOUNTER — Other Ambulatory Visit: Payer: Self-pay

## 2019-01-12 LAB — CULTURE, BLOOD (ROUTINE X 2)
Culture: NO GROWTH
Culture: NO GROWTH
Special Requests: ADEQUATE
Special Requests: ADEQUATE

## 2019-01-13 ENCOUNTER — Encounter
Admission: RE | Admit: 2019-01-13 | Discharge: 2019-01-13 | Disposition: A | Discharge status: Home / Self Care | Payer: Medicare Other | Source: Ambulatory Visit | Attending: Infectious Diseases | Admitting: Infectious Diseases

## 2019-01-13 DIAGNOSIS — N390 Urinary tract infection, site not specified: Secondary | ICD-10-CM | POA: Insufficient documentation

## 2019-01-13 LAB — COMPREHENSIVE METABOLIC PANEL
ALT: 63 U/L — ABNORMAL HIGH (ref 0–44)
AST: 70 U/L — ABNORMAL HIGH (ref 15–41)
Albumin: 3.4 g/dL — ABNORMAL LOW (ref 3.5–5.0)
Alkaline Phosphatase: 59 U/L (ref 38–126)
Anion gap: 9 (ref 5–15)
BUN: 13 mg/dL (ref 8–23)
CO2: 24 mmol/L (ref 22–32)
Calcium: 8.6 mg/dL — ABNORMAL LOW (ref 8.9–10.3)
Chloride: 104 mmol/L (ref 98–111)
Creatinine, Ser: 1.06 mg/dL — ABNORMAL HIGH (ref 0.44–1.00)
GFR calc Af Amer: >60 mL/min (ref >60)
GFR calc non Af Amer: 54 mL/min — ABNORMAL LOW (ref >60)
Glucose, Bld: 91 mg/dL (ref 70–99)
Potassium: 3 mmol/L — ABNORMAL LOW (ref 3.5–5.1)
Sodium: 137 mmol/L (ref 135–145)
Total Bilirubin: 0.5 mg/dL (ref 0.3–1.2)
Total Protein: 6 g/dL — ABNORMAL LOW (ref 6.5–8.1)

## 2019-01-13 LAB — CBC WITH DIFFERENTIAL/PLATELET
Abs Immature Granulocytes: 0.09 10*3/uL — ABNORMAL HIGH (ref 0.00–0.07)
Basophils Absolute: 0.1 10*3/uL (ref 0.0–0.1)
Basophils Relative: 1 %
Eosinophils Absolute: 0.3 10*3/uL (ref 0.0–0.5)
Eosinophils Relative: 5 %
HCT: 28.6 % — ABNORMAL LOW (ref 36.0–46.0)
Hemoglobin: 9 g/dL — ABNORMAL LOW (ref 12.0–15.0)
Immature Granulocytes: 2 %
Lymphocytes Relative: 8 %
Lymphs Abs: 0.4 10*3/uL — ABNORMAL LOW (ref 0.7–4.0)
MCH: 28.6 pg (ref 26.0–34.0)
MCHC: 31.5 g/dL (ref 30.0–36.0)
MCV: 90.8 fL (ref 80.0–100.0)
Monocytes Absolute: 0.6 10*3/uL (ref 0.1–1.0)
Monocytes Relative: 11 %
Neutro Abs: 4.1 10*3/uL (ref 1.7–7.7)
Neutrophils Relative %: 73 %
Platelets: 223 10*3/uL (ref 150–400)
RBC: 3.15 MIL/uL — ABNORMAL LOW (ref 3.87–5.11)
RDW: 14.6 % (ref 11.5–15.5)
WBC: 5.6 10*3/uL (ref 4.0–10.5)
nRBC: 0 % (ref 0.0–0.2)

## 2019-01-19 ENCOUNTER — Other Ambulatory Visit
Admission: RE | Admit: 2019-01-19 | Discharge: 2019-01-19 | Disposition: A | Discharge status: Home / Self Care | Payer: Medicare Other | Source: Other Acute Inpatient Hospital | Attending: Infectious Diseases | Admitting: Infectious Diseases

## 2019-01-19 DIAGNOSIS — N390 Urinary tract infection, site not specified: Secondary | ICD-10-CM | POA: Insufficient documentation

## 2019-01-19 LAB — COMPREHENSIVE METABOLIC PANEL
ALT: 26 U/L (ref 0–44)
AST: 28 U/L (ref 15–41)
Albumin: 3.6 g/dL (ref 3.5–5.0)
Alkaline Phosphatase: 57 U/L (ref 38–126)
Anion gap: 12 (ref 5–15)
BUN: 11 mg/dL (ref 8–23)
CO2: 24 mmol/L (ref 22–32)
Calcium: 9.1 mg/dL (ref 8.9–10.3)
Chloride: 100 mmol/L (ref 98–111)
Creatinine, Ser: 1.15 mg/dL — ABNORMAL HIGH (ref 0.44–1.00)
GFR calc Af Amer: 57 mL/min — ABNORMAL LOW (ref >60)
GFR calc non Af Amer: 49 mL/min — ABNORMAL LOW (ref >60)
Glucose, Bld: 127 mg/dL — ABNORMAL HIGH (ref 70–99)
Potassium: 3.6 mmol/L (ref 3.5–5.1)
Sodium: 136 mmol/L (ref 135–145)
Total Bilirubin: 0.6 mg/dL (ref 0.3–1.2)
Total Protein: 6.3 g/dL — ABNORMAL LOW (ref 6.5–8.1)

## 2019-01-19 LAB — CBC WITH DIFFERENTIAL/PLATELET
Abs Immature Granulocytes: 0.04 10*3/uL (ref 0.00–0.07)
Basophils Absolute: 0.1 10*3/uL (ref 0.0–0.1)
Basophils Relative: 1 %
Eosinophils Absolute: 0.3 10*3/uL (ref 0.0–0.5)
Eosinophils Relative: 4 %
HCT: 30.8 % — ABNORMAL LOW (ref 36.0–46.0)
Hemoglobin: 9.8 g/dL — ABNORMAL LOW (ref 12.0–15.0)
Immature Granulocytes: 1 %
Lymphocytes Relative: 7 %
Lymphs Abs: 0.5 10*3/uL — ABNORMAL LOW (ref 0.7–4.0)
MCH: 28.2 pg (ref 26.0–34.0)
MCHC: 31.8 g/dL (ref 30.0–36.0)
MCV: 88.8 fL (ref 80.0–100.0)
Monocytes Absolute: 0.7 10*3/uL (ref 0.1–1.0)
Monocytes Relative: 11 %
Neutro Abs: 5.3 10*3/uL (ref 1.7–7.7)
Neutrophils Relative %: 76 %
Platelets: 213 10*3/uL (ref 150–400)
RBC: 3.47 MIL/uL — ABNORMAL LOW (ref 3.87–5.11)
RDW: 14.7 % (ref 11.5–15.5)
WBC: 6.9 10*3/uL (ref 4.0–10.5)
nRBC: 0 % (ref 0.0–0.2)

## 2019-01-26 ENCOUNTER — Other Ambulatory Visit: Payer: Self-pay

## 2019-01-27 ENCOUNTER — Telehealth: Payer: Self-pay | Admitting: *Deleted

## 2019-01-27 ENCOUNTER — Other Ambulatory Visit: Payer: Self-pay

## 2019-01-27 ENCOUNTER — Inpatient Hospital Stay: Payer: Medicare Other | Attending: Obstetrics and Gynecology | Admitting: Obstetrics and Gynecology

## 2019-01-27 ENCOUNTER — Other Ambulatory Visit: Payer: Self-pay | Admitting: *Deleted

## 2019-01-27 VITALS — BP 141/62 | HR 82 | Temp 95.8°F | Wt 311.0 lb

## 2019-01-27 DIAGNOSIS — J449 Chronic obstructive pulmonary disease, unspecified: Secondary | ICD-10-CM | POA: Diagnosis not present

## 2019-01-27 DIAGNOSIS — C8293 Follicular lymphoma, unspecified, intra-abdominal lymph nodes: Secondary | ICD-10-CM

## 2019-01-27 DIAGNOSIS — M419 Scoliosis, unspecified: Secondary | ICD-10-CM | POA: Insufficient documentation

## 2019-01-27 DIAGNOSIS — I739 Peripheral vascular disease, unspecified: Secondary | ICD-10-CM | POA: Diagnosis not present

## 2019-01-27 DIAGNOSIS — Z79899 Other long term (current) drug therapy: Secondary | ICD-10-CM | POA: Insufficient documentation

## 2019-01-27 DIAGNOSIS — F329 Major depressive disorder, single episode, unspecified: Secondary | ICD-10-CM | POA: Diagnosis not present

## 2019-01-27 DIAGNOSIS — I129 Hypertensive chronic kidney disease with stage 1 through stage 4 chronic kidney disease, or unspecified chronic kidney disease: Secondary | ICD-10-CM | POA: Insufficient documentation

## 2019-01-27 DIAGNOSIS — N183 Chronic kidney disease, stage 3 (moderate): Secondary | ICD-10-CM | POA: Insufficient documentation

## 2019-01-27 DIAGNOSIS — Z87891 Personal history of nicotine dependence: Secondary | ICD-10-CM | POA: Insufficient documentation

## 2019-01-27 DIAGNOSIS — Z6841 Body Mass Index (BMI) 40.0 and over, adult: Secondary | ICD-10-CM | POA: Diagnosis not present

## 2019-01-27 DIAGNOSIS — N838 Other noninflammatory disorders of ovary, fallopian tube and broad ligament: Secondary | ICD-10-CM | POA: Insufficient documentation

## 2019-01-27 DIAGNOSIS — G4733 Obstructive sleep apnea (adult) (pediatric): Secondary | ICD-10-CM | POA: Insufficient documentation

## 2019-01-27 DIAGNOSIS — G35 Multiple sclerosis: Secondary | ICD-10-CM | POA: Insufficient documentation

## 2019-01-27 DIAGNOSIS — D3912 Neoplasm of uncertain behavior of left ovary: Secondary | ICD-10-CM

## 2019-01-27 DIAGNOSIS — I700 Atherosclerosis of aorta: Secondary | ICD-10-CM | POA: Insufficient documentation

## 2019-01-27 DIAGNOSIS — R4182 Altered mental status, unspecified: Secondary | ICD-10-CM | POA: Diagnosis not present

## 2019-01-27 NOTE — Patient Instructions (Signed)
Ovarian Cyst An ovarian cyst is a fluid-filled sac on an ovary. The ovaries are organs that make eggs in women. Most ovarian cysts go away on their own and are not cancerous (are benign). Some cysts need treatment. Follow these instructions at home:  Take over-the-counter and prescription medicines only as told by your doctor.  Do not drive or use heavy machinery while taking prescription pain medicine.  Get pelvic exams and Pap tests as often as told by your doctor.  Return to your normal activities as told by your doctor. Ask your doctor what activities are safe for you.  Do not use any products that contain nicotine or tobacco, such as cigarettes and e-cigarettes. If you need help quitting, ask your doctor.  Keep all follow-up visits as told by your doctor. This is important. Contact a doctor if:  Your periods are: ? Late. ? Irregular. ? Painful.   Your periods stop.  You have pelvic pain that does not go away.  You have pressure on your bladder.  You have trouble making your bladder empty when you pee (urinate).  You have pain during sex.  You have any of the following in your belly (abdomen): ? A feeling of fullness. ? Pressure. ? Discomfort. ? Pain that does not go away. ? Swelling.  You feel sick most of the time.  You have trouble pooping (have constipation).  You are not as hungry as usual (you lose your appetite).  You get very bad acne.  You start to have more hair on your body and face.  You are gaining weight or losing weight without changing your exercise and eating habits.  You think you may be pregnant. Get help right away if:  You have belly pain that is very bad or gets worse.  You cannot eat or drink without throwing up (vomiting).  You suddenly get a fever.  Your period is a lot heavier than usual. This information is not intended to replace advice given to you by your health care provider. Make sure you discuss any questions you have  with your health care provider. Document Released: 10/02/2007 Document Revised: 03/28/2017 Document Reviewed: 09/17/2015 Elsevier Patient Education  2020 Elsevier Inc.  

## 2019-01-27 NOTE — Progress Notes (Signed)
Gynecologic Oncology Consult Visit   Referring Provider: Dr. Janese Banks  Chief Complaint: left ovarian mass  Subjective:  [name] is a 68 y.o. female who is seen in consultation from Dr. Janese Banks for enlarging left ovarian mass.   CT A/P 05/21/2017 Reproductive: The uterus has normal CT imaging appearance. Similar appearance cystic mass in the left adnexal space measuring 4.9 x 5.5 cm today compared to 4.3 x 4.9 cm previously. No right adnexal mass.  CT A/P 05/06/2018 Reproductive: Approximately 5.3 by 3.5 cm cystic lesion of the left ovary, mildly reduced in size compared to the 05/21/2017 exam. Uterus unremarkable.  CT A/P 01/05/2019 IMPRESSION: Gaseous distention of the transverse colon. No evidence of bowel obstruction. This may reflect mild ileus. Aortic atherosclerosis. Enlarging left ovarian cystic mass now measuring up to 6.4 cm compared to 4.9 cm previously. This could be further characterized with elective pelvic ultrasound or MRI.  However when compared to prior images the mass is similar in size to the 6.1 cm noted in 2018. She is asymptomatic. She was previously seen by Dr. Fransisca Connors for this cyst in 2018. He had released her from clinic.  Gynecologic History [name] is a 68 y.o. female who is seen in consultation for enlarging left ovarian mass. She has diagnosed with stage II B-Cell follicular lymphoma in [id number] based on enlarged left inguinal lymph node measured up to 6.2 cm. and is s/p chemotherapy with rituxan with CT showing decrease in lymphadenopathy. She underwent involved field radiation therapy. She a negative initial biopsy of the lymph node. In March this year she underwent excisional biopsy by Dr. Jamal Collin, the pathology of which reportedly showed follicular lymphoma, grade 1-2, predominantly follicular.   06/14/16 CT Scan notable for a left adnexal cyst measuring 5.9 x 5.7 x 3.9 cm.   CT scan 11/05/16  IMPRESSION: 1. Moderate reduction in size of the lower abdominal  and pelvic adenopathy. 2. Overall stable volume of the cyst of the left adnexal lesion. There is probably some internal septation. This was not previously hypermetabolic. In light of the size of the lesion as well as the patient is age, pelvic sonography is recommended to assess potential for ovarian malignancy.  She was seen by Dr Glennon Mac in benign Gyn and he referred for management recommendations.   [place] 12/10/16 IMPRESSION: 1. 6.3 cm complex cystic left ovarian mass. This contains multiple thin internal septations. Surgical consultation is recommended. 2. 7.3 cm solid left adnexal/pelvic sidewall mass, corresponds to the CT demonstrated pelvic mass and history of lymphoma 3. Nonvisualization of the right ovary  She saw Dr. Fransisca Connors on 12/12/2018. Mass was felt to be stable over past 6 months and benign appearing. CA 125 was normal. She was felt to be a relatively poor surgical candidate due to morbid obesity, severe MS.     Problem List: Patient Active Problem List   Diagnosis Date Noted  . Ovarian mass, left 01/27/2019  . Sepsis ([place]) 01/05/2019  . UTI (urinary tract infection) 06/13/2018  . Altered mental status 06/11/2018  . Fall 05/13/2018  . Depression 05/13/2018  . Recurrent cellulitis of lower extremity 06/25/2017  . Medication monitoring encounter 06/05/2017  . Cellulitis of left lower leg 04/25/2017  . Adjustment disorder with mixed disturbance of emotions and conduct 04/23/2017  . Foot pain, bilateral 02/03/2017  . Tinea pedis 02/03/2017  . Left ovarian cyst 12/09/2016  . Abdominal aortic atherosclerosis ([place]) 11/11/2016  . Swelling of lower extremity 10/11/2016  . Obstructive sleep apnea 10/11/2016  .  Follicular lymphoma of intra-abdominal lymph nodes ([place]) 08/06/2016  . Multiple falls 06/07/2016  . Inguinal adenopathy 05/29/2016  . Obesity, morbid ([place]) 01/05/2016  . Low HDL (under 40) 12/19/2015  . Skin ulcer ([place]) 11/08/2015  . Peripheral vascular disease of  lower extremity with ulceration ([place]) 11/08/2015  . CKD (chronic kidney disease) stage 3, GFR 30-59 ml/min (HCC) 11/08/2015  . Constipation due to pain medication 01/31/2015  . Obstructive sleep apnea of adult 01/13/2015  . Pelvic muscle wasting 01/13/2015  . Incomplete bladder emptying 01/13/2015  . Headache, migraine 10/24/2014  . 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 Current tobacco use 10/24/2014  . Lumbar radiculopathy, chronic 10/02/2013  . COPD with bronchial hyperresponsiveness ([place]) 10/02/2013  . Major depressive disorder, recurrent, in partial remission ([place]) 10/02/2013  . Hypertension goal BP (blood pressure) < 140/90 10/02/2013  . Multiple sclerosis ([place]) 10/02/2013  . Absence of bladder continence 09/25/2012  . Acontractile bladder 02/12/2012  . Narrowing of intervertebral disc space 11/26/2011  . Kyphoscoliosis and scoliosis 11/26/2011    Past Medical History: Past Medical History:  Diagnosis Date  . Abdominal aortic atherosclerosis ([place]) 11/11/2016  . ADHD   . Anxiety   . COPD (chronic obstructive pulmonary disease) ([place])   . Depression    major depressive  . Dyspnea    doe  . Edema    left leg  . Follicular lymphoma ([place])    B Cell  . Follicular lymphoma grade II ([place])   . Hypertension   . Hypotension    idiopathic  . Kyphoscoliosis and scoliosis 11/26/2011  . Morbid obesity ([place]) 01/05/2016  . Multiple sclerosis ([place])   . Multiple sclerosis ([place])    1980's  . Neuromuscular disorder ([place])   . Obstructive and reflux uropathy    foley  . Pain    atypical facial  . Peripheral vascular disease of lower extremity with ulceration ([place]) 11/08/2015  . Skin ulcer ([place]) 11/08/2015  . Weakness    generalized. has MS    Past Surgical History: Past Surgical History:  Procedure Laterality Date  . BACK SURGERY N/A 2002  . CYST EXCISION     lower back  . INGUINAL LYMPH NODE BIOPSY Left 07/04/2016   Procedure: INGUINAL LYMPH NODE BIOPSY;  Surgeon: Christene Lye, MD;  Location: [facility] ORS;  Service: General;  Laterality: Left;  . PORTACATH PLACEMENT N/A 07/22/2016   Procedure: INSERTION PORT-A-CATH;  Surgeon: Christene Lye, MD;  Location: [facility] ORS;  Service: General;  Laterality: N/A;  . TONSILLECTOMY AND ADENOIDECTOMY    . TUBAL LIGATION      OB History:  OB History  Gravida Para Term Preterm AB Living  3         3  SAB TAB Ectopic Multiple Live Births               # Outcome Date GA Lbr Len/2nd Weight Sex Delivery Anes PTL Lv  3 Gravida           2 Gravida           1 Gravida             Obstetric Comments  Menstrual age: 53    Age 1st Pregnancy: 60    Family History: Family History  Problem Relation Age of Onset  . COPD Mother   . Diabetes Mother   . Heart failure Mother   . Alcohol abuse Father   . Kidney disease Father   . Kidney failure Father   .  Arthritis Sister   . CAD Maternal Grandmother   . Stroke Maternal Grandfather   . Arthritis Sister   . Mental illness Sister   . Arthritis Brother     Social History: Social History   Socioeconomic History  . Marital status: Married    Spouse name: Not on file  . Number of children: 3  . Years of education: Not on file  . Highest education level: Not on file  Occupational History  . Occupation: disabled  Social Needs  . Financial resource strain: Not on file  . Food insecurity    Worry: Not on file    Inability: Not on file  . Transportation needs    Medical: Not on file    Non-medical: Not on file  Tobacco Use  . Smoking status: Former Smoker    Packs/day: 1.00    Years: 20.00    Pack years: 20.00    Types: Cigarettes    Start date: 04/30/1995    Quit date: 02/03/2016    Years since quitting: 2.9  . Smokeless tobacco: Never Used  Substance and Sexual Activity  . Alcohol use: No    Alcohol/week: 0.0 standard drinks  . Drug use: Yes    Types: Marijuana    Comment: smokes THC occasionally per pt   . Sexual activity: Not Currently    Birth  control/protection: None  Lifestyle  . Physical activity    Days per week: Not on file    Minutes per session: Not on file  . Stress: Not on file  Relationships  . Social Herbalist on phone: Not on file    Gets together: Not on file    Attends religious service: Not on file    Active member of club or organization: Not on file    Attends meetings of clubs or organizations: Not on file    Relationship status: Not on file  . Intimate partner violence    Fear of current or ex partner: Not on file    Emotionally abused: Not on file    Physically abused: Not on file    Forced sexual activity: Not on file  Other Topics Concern  . Not on file  Social History Narrative   She is married.     Allergies: No Known Allergies  Current Medications: Current Outpatient Medications  Medication Sig Dispense Refill  . acetaminophen (TYLENOL) 325 MG tablet Take 2 tablets (650 mg total) by mouth every 6 (six) hours as needed for mild pain (or Fever >/= 101).    Marland Kitchen albuterol (VENTOLIN HFA) 108 (90 Base) MCG/ACT inhaler Inhale 1 puff into the lungs every 4 (four) hours as needed for shortness of breath.    Marland Kitchen amLODipine (NORVASC) 5 MG tablet Take 5 mg by mouth daily.    Marland Kitchen atorvastatin (LIPITOR) 10 MG tablet     . baclofen (LIORESAL) 10 MG tablet     . BIOTIN PO Take by mouth daily. Pt taking 500 mcg daily    . budesonide-formoterol (SYMBICORT) 160-4.5 MCG/ACT inhaler Inhale 2 puffs into the lungs 2 (two) times daily.    Marland Kitchen buPROPion (WELLBUTRIN XL) 300 MG 24 hr tablet     . cefTRIAXone (ROCEPHIN) 10 g injection     . clonazePAM (KLONOPIN) 1 MG tablet Take 0.5 tablets (0.5 mg total) by mouth at bedtime. 5 tablet 0  . docusate sodium (COLACE) 100 MG capsule Take 1 capsule (100 mg total) by mouth 2 (two) times daily. 10  capsule 0  . DULoxetine (CYMBALTA) 60 MG capsule Take 1 capsule (60 mg total) by mouth every morning. 30 capsule 3  . gabapentin (NEURONTIN) 600 MG tablet Take 1 tablet (600  mg total) by mouth 3 (three) times daily.    . hydrALAZINE (APRESOLINE) 50 MG tablet Take 50 mg by mouth 3 (three) times daily.    . interferon beta-1a (AVONEX) 30 MCG/0.5ML PSKT injection Inject 30 mcg into the muscle every Monday.    Marland Kitchen lisinopril (ZESTRIL) 20 MG tablet Take 20 mg by mouth daily.    . Multiple Vitamin (MULTIVITAMIN WITH MINERALS) TABS tablet Take 1 tablet by mouth every evening.     Marland Kitchen MYRBETRIQ 50 MG TB24 tablet TAKE ONE TABLET BY MOUTH ONCE DAILY (FOR OVERACTIVE BLADDER) 30 tablet 0  . polyethylene glycol powder (GLYCOLAX/MIRALAX) powder Take 17 g by mouth daily as needed for mild constipation. Mixed in water (Patient taking differently: Take 17 g by mouth daily as needed for mild constipation. For constipation)    . potassium chloride (KLOR-CON) 10 MEQ tablet     . QUEtiapine Fumarate (SEROQUEL XR) 150 MG 24 hr tablet Take 150 mg by mouth at bedtime.   4  . Teriflunomide (AUBAGIO) 14 MG TABS Take by mouth.    Marland Kitchen tiZANidine (ZANAFLEX) 4 MG tablet Take 4 mg by mouth 3 (three) times daily.    . traMADol (ULTRAM) 50 MG tablet Take 1 tablet (50 mg total) by mouth every 6 (six) hours as needed for up to 20 doses for moderate pain. 20 tablet 0  . vitamin C (ASCORBIC ACID) 500 MG tablet Take 500 mg by mouth every evening.      No current facility-administered medications for this visit.    Facility-Administered Medications Ordered in Other Visits  Medication Dose Route Frequency Provider Last Rate Last Dose  . heparin lock flush 100 unit/mL  500 Units Intracatheter Once PRN Sindy Guadeloupe, MD       Review of Systems General: positive for weight gain; negative for fevers, night sweats Skin: negative for changes in rash, moles or sores Eyes: negative for vision issues HEENT: negative for tinnitus, voice changes Pulmonary: positive for dyspnea, cough, and wheezing Cardiac: negative for palpitations, pain, discomfort, but leg pain with walking Gastrointestinal: negative for nausea,  vomiting,constipation, diarrhea, hematemesis, hematochezia Genitourinary/Sexual: Foley catheter in place Ob/Gyn: negative for irregular bleeding, pain Musculoskeletal: pain in the arms otherwise negative  Hematology: negative for easy bruising, bleeding Neurologic/Psych: positive for weakness and numbness, negative for headaches, seizures, paralysis   Objective:  Physical Examination:  BP (!) 141/62 (BP Location: Right Arm, Patient Position: Sitting)   Pulse 82   Temp (!) 95.8 F (35.4 C) (Tympanic)   Wt (!) 311 lb (141.1 kg)   BMI 50.20 kg/m     ECOG Performance Status: 3 - Symptomatic, >50% confined to bed or chair  GENERAL: ill-ppearing female in no acute distress; mobility is very limited HEENT:  PERRL, neck supple  NODES:  No inguinal lymphadenopathy palpated.  ABDOMEN:  Soft, nontender, very protuberant. Exam is limited by body habitus. No obvious masses, hernia, or ascites.   EXTREMITIES:  Bilateral LE edema; with redness   NEURO:  Nonfocal. Well oriented.  Appropriate affect.  Pelvic: chaperoned by RN, EGBUS: no lesions, stool present across skin and present on vulva & compressed into vagina, Cervix: no lesions, nontender, mobile; Vagina: no lesions, stool present in vagina, Uterus: unable to palpate or determine size, Adnexa: no palpable masses but very  limited exam. Nontender Rectovaginal: deferred  Lab Review n/a  Radiologic Imaging: Personally reviewed images.     Assessment:  AZENET DONATI is a 68 y.o. female diagnosed with asymptomatic left ovarian cyst during work up for follicular lymphoma. Cyst has been essentially stable over the past 2 years. Benign characteristics and ca 125 has been normal. Patient poor surgical candidate due to morbid obesity, severe MS, lymphoma, OSA, COPD, PVD, CKD.   Medical co-morbidities complicating care: Multiple Sclerosis, CKD, COPD, PVD with ulceration and recurrent cellulitis, morbid obesity BMI [id number], follicular lymphoma,  OSA Plan:   Problem List Items Addressed This Visit      Other   Ovarian mass, left - Primary     We had a long conversation with patient about patient's ovarian cyst and provided her with information regarding her cyst. We discussed options for management. Given her significant co-morbidities, she is a poor surgical candidate. We discussed risks complications of ovarian cysts and precautions for ovarian torsion, rupture or hemorrhage, persistent pain or pressure were given. She is aware that torsion and rupture can sometimes lead to emergent surgery. However, she is such a poor surgical candidate that options are limited. I do not recommend follow up and she can return to clinic as needed.   A total of 35 minutes were spent with the patient/family today of which greater than 100% was spent in education, counseling and coordination of care for ovarian cyst. Beckey Rutter, NP scribed the note.   [name] Gaetana Michaelis, MD  CC:  Dr. Janese Banks

## 2019-01-27 NOTE — Progress Notes (Signed)
Cleaned of incontinent stool. Chaperoned pelvic exam. Went over appointment coming up with Dr. Janese Banks and provided in AVS. Assist into car with 2 person assist.

## 2019-01-27 NOTE — Telephone Encounter (Signed)
Pt had an appt with GYN today and she missed her appt with Dr. Janese Banks back in July. Since she is here today we made an appt to f/u with Dr. Janese Banks. I could not be added on today due toDr. Janese Banks does not work on wed. I did let Kristi know that when she checks out to remind her that we made a new appt for her to see Dr. Janese Banks

## 2019-01-27 NOTE — Progress Notes (Signed)
Patient here today for new consult.  Patient c/o pain in legs, 4/10.

## 2019-02-02 ENCOUNTER — Encounter: Payer: Self-pay | Admitting: Emergency Medicine

## 2019-02-02 ENCOUNTER — Other Ambulatory Visit: Payer: Self-pay

## 2019-02-02 ENCOUNTER — Inpatient Hospital Stay
Admission: EM | Admit: 2019-02-02 | Discharge: 2019-02-09 | DRG: 603 | Disposition: A | Discharge status: Skilled Nursing Facility | Payer: Medicare Other | Attending: Internal Medicine | Admitting: Internal Medicine

## 2019-02-02 DIAGNOSIS — Z8572 Personal history of non-Hodgkin lymphomas: Secondary | ICD-10-CM

## 2019-02-02 DIAGNOSIS — Z833 Family history of diabetes mellitus: Secondary | ICD-10-CM

## 2019-02-02 DIAGNOSIS — F419 Anxiety disorder, unspecified: Secondary | ICD-10-CM | POA: Diagnosis present

## 2019-02-02 DIAGNOSIS — Z872 Personal history of diseases of the skin and subcutaneous tissue: Secondary | ICD-10-CM | POA: Diagnosis not present

## 2019-02-02 DIAGNOSIS — N3000 Acute cystitis without hematuria: Secondary | ICD-10-CM | POA: Diagnosis present

## 2019-02-02 DIAGNOSIS — J449 Chronic obstructive pulmonary disease, unspecified: Secondary | ICD-10-CM | POA: Diagnosis present

## 2019-02-02 DIAGNOSIS — R531 Weakness: Secondary | ICD-10-CM | POA: Diagnosis present

## 2019-02-02 DIAGNOSIS — Z9181 History of falling: Secondary | ICD-10-CM | POA: Diagnosis not present

## 2019-02-02 DIAGNOSIS — B965 Pseudomonas (aeruginosa) (mallei) (pseudomallei) as the cause of diseases classified elsewhere: Secondary | ICD-10-CM | POA: Diagnosis present

## 2019-02-02 DIAGNOSIS — Z818 Family history of other mental and behavioral disorders: Secondary | ICD-10-CM

## 2019-02-02 DIAGNOSIS — Z8619 Personal history of other infectious and parasitic diseases: Secondary | ICD-10-CM | POA: Diagnosis not present

## 2019-02-02 DIAGNOSIS — Z87891 Personal history of nicotine dependence: Secondary | ICD-10-CM | POA: Diagnosis not present

## 2019-02-02 DIAGNOSIS — W19XXXA Unspecified fall, initial encounter: Secondary | ICD-10-CM | POA: Diagnosis present

## 2019-02-02 DIAGNOSIS — Z825 Family history of asthma and other chronic lower respiratory diseases: Secondary | ICD-10-CM

## 2019-02-02 DIAGNOSIS — I700 Atherosclerosis of aorta: Secondary | ICD-10-CM | POA: Diagnosis present

## 2019-02-02 DIAGNOSIS — T859XXA Unspecified complication of internal prosthetic device, implant and graft, initial encounter: Secondary | ICD-10-CM | POA: Diagnosis present

## 2019-02-02 DIAGNOSIS — Z823 Family history of stroke: Secondary | ICD-10-CM

## 2019-02-02 DIAGNOSIS — F909 Attention-deficit hyperactivity disorder, unspecified type: Secondary | ICD-10-CM | POA: Diagnosis present

## 2019-02-02 DIAGNOSIS — M419 Scoliosis, unspecified: Secondary | ICD-10-CM | POA: Diagnosis present

## 2019-02-02 DIAGNOSIS — Z20828 Contact with and (suspected) exposure to other viral communicable diseases: Secondary | ICD-10-CM | POA: Diagnosis present

## 2019-02-02 DIAGNOSIS — Z811 Family history of alcohol abuse and dependence: Secondary | ICD-10-CM

## 2019-02-02 DIAGNOSIS — F1290 Cannabis use, unspecified, uncomplicated: Secondary | ICD-10-CM | POA: Diagnosis present

## 2019-02-02 DIAGNOSIS — G35 Multiple sclerosis: Secondary | ICD-10-CM | POA: Diagnosis present

## 2019-02-02 DIAGNOSIS — Z7401 Bed confinement status: Secondary | ICD-10-CM | POA: Diagnosis not present

## 2019-02-02 DIAGNOSIS — S81809A Unspecified open wound, unspecified lower leg, initial encounter: Secondary | ICD-10-CM | POA: Diagnosis present

## 2019-02-02 DIAGNOSIS — Z8261 Family history of arthritis: Secondary | ICD-10-CM

## 2019-02-02 DIAGNOSIS — N139 Obstructive and reflux uropathy, unspecified: Secondary | ICD-10-CM | POA: Diagnosis present

## 2019-02-02 DIAGNOSIS — I739 Peripheral vascular disease, unspecified: Secondary | ICD-10-CM | POA: Diagnosis present

## 2019-02-02 DIAGNOSIS — G834 Cauda equina syndrome: Secondary | ICD-10-CM | POA: Diagnosis present

## 2019-02-02 DIAGNOSIS — Z841 Family history of disorders of kidney and ureter: Secondary | ICD-10-CM

## 2019-02-02 DIAGNOSIS — Z8249 Family history of ischemic heart disease and other diseases of the circulatory system: Secondary | ICD-10-CM

## 2019-02-02 DIAGNOSIS — Z23 Encounter for immunization: Secondary | ICD-10-CM | POA: Diagnosis present

## 2019-02-02 DIAGNOSIS — F329 Major depressive disorder, single episode, unspecified: Secondary | ICD-10-CM | POA: Diagnosis present

## 2019-02-02 DIAGNOSIS — Z960 Presence of urogenital implants: Secondary | ICD-10-CM | POA: Diagnosis present

## 2019-02-02 DIAGNOSIS — I10 Essential (primary) hypertension: Secondary | ICD-10-CM | POA: Diagnosis present

## 2019-02-02 DIAGNOSIS — R0602 Shortness of breath: Secondary | ICD-10-CM

## 2019-02-02 DIAGNOSIS — L8990 Pressure ulcer of unspecified site, unspecified stage: Secondary | ICD-10-CM | POA: Insufficient documentation

## 2019-02-02 DIAGNOSIS — Z6841 Body Mass Index (BMI) 40.0 and over, adult: Secondary | ICD-10-CM

## 2019-02-02 DIAGNOSIS — L03115 Cellulitis of right lower limb: Principal | ICD-10-CM | POA: Diagnosis present

## 2019-02-02 DIAGNOSIS — L0390 Cellulitis, unspecified: Secondary | ICD-10-CM

## 2019-02-02 DIAGNOSIS — L03116 Cellulitis of left lower limb: Secondary | ICD-10-CM | POA: Diagnosis present

## 2019-02-02 DIAGNOSIS — R609 Edema, unspecified: Secondary | ICD-10-CM | POA: Diagnosis present

## 2019-02-02 DIAGNOSIS — N319 Neuromuscular dysfunction of bladder, unspecified: Secondary | ICD-10-CM | POA: Diagnosis not present

## 2019-02-02 LAB — URINALYSIS, COMPLETE (UACMP) WITH MICROSCOPIC
Bilirubin Urine: NEGATIVE
Glucose, UA: NEGATIVE mg/dL
Hgb urine dipstick: NEGATIVE
Ketones, ur: NEGATIVE mg/dL
Nitrite: NEGATIVE
Protein, ur: NEGATIVE mg/dL
Specific Gravity, Urine: 1.009 (ref 1.005–1.030)
WBC, UA: >50 WBC/hpf — ABNORMAL HIGH (ref 0–5)
pH: 5 (ref 5.0–8.0)

## 2019-02-02 LAB — CBC WITH DIFFERENTIAL/PLATELET
Abs Immature Granulocytes: 0.02 10*3/uL (ref 0.00–0.07)
Basophils Absolute: 0.1 10*3/uL (ref 0.0–0.1)
Basophils Relative: 2 %
Eosinophils Absolute: 0.3 10*3/uL (ref 0.0–0.5)
Eosinophils Relative: 6 %
HCT: 32.5 % — ABNORMAL LOW (ref 36.0–46.0)
Hemoglobin: 10.3 g/dL — ABNORMAL LOW (ref 12.0–15.0)
Immature Granulocytes: 0 %
Lymphocytes Relative: 8 %
Lymphs Abs: 0.4 10*3/uL — ABNORMAL LOW (ref 0.7–4.0)
MCH: 28.2 pg (ref 26.0–34.0)
MCHC: 31.7 g/dL (ref 30.0–36.0)
MCV: 89 fL (ref 80.0–100.0)
Monocytes Absolute: 0.5 10*3/uL (ref 0.1–1.0)
Monocytes Relative: 10 %
Neutro Abs: 4 10*3/uL (ref 1.7–7.7)
Neutrophils Relative %: 74 %
Platelets: 219 10*3/uL (ref 150–400)
RBC: 3.65 MIL/uL — ABNORMAL LOW (ref 3.87–5.11)
RDW: 14.2 % (ref 11.5–15.5)
WBC: 5.3 10*3/uL (ref 4.0–10.5)
nRBC: 0 % (ref 0.0–0.2)

## 2019-02-02 LAB — COMPREHENSIVE METABOLIC PANEL
ALT: 18 U/L (ref 0–44)
AST: 24 U/L (ref 15–41)
Albumin: 3.7 g/dL (ref 3.5–5.0)
Alkaline Phosphatase: 66 U/L (ref 38–126)
Anion gap: 9 (ref 5–15)
BUN: 17 mg/dL (ref 8–23)
CO2: 25 mmol/L (ref 22–32)
Calcium: 9.1 mg/dL (ref 8.9–10.3)
Chloride: 103 mmol/L (ref 98–111)
Creatinine, Ser: 1.35 mg/dL — ABNORMAL HIGH (ref 0.44–1.00)
GFR calc Af Amer: 47 mL/min — ABNORMAL LOW (ref >60)
GFR calc non Af Amer: 40 mL/min — ABNORMAL LOW (ref >60)
Glucose, Bld: 101 mg/dL — ABNORMAL HIGH (ref 70–99)
Potassium: 4.3 mmol/L (ref 3.5–5.1)
Sodium: 137 mmol/L (ref 135–145)
Total Bilirubin: 0.6 mg/dL (ref 0.3–1.2)
Total Protein: 7.2 g/dL (ref 6.5–8.1)

## 2019-02-02 LAB — TROPONIN I (HIGH SENSITIVITY): Troponin I (High Sensitivity): 5 ng/L (ref <18)

## 2019-02-02 MED ORDER — POLYETHYLENE GLYCOL 3350 17 G PO PACK
17.0000 g | PACK | Freq: Every day | ORAL | Status: DC | PRN
  Administered 2019-02-05: 17 g via ORAL
  Filled 2019-02-02 (×2): qty 1

## 2019-02-02 MED ORDER — ATORVASTATIN CALCIUM 20 MG PO TABS
10.0000 mg | ORAL_TABLET | Freq: Every day | ORAL | Status: DC
  Administered 2019-02-02 – 2019-02-09 (×8): 10 mg via ORAL
  Filled 2019-02-02 (×8): qty 1

## 2019-02-02 MED ORDER — CHLORHEXIDINE GLUCONATE CLOTH 2 % EX PADS
6.0000 | MEDICATED_PAD | Freq: Every day | CUTANEOUS | Status: DC
  Administered 2019-02-03 – 2019-02-08 (×5): 6 via TOPICAL

## 2019-02-02 MED ORDER — MIRABEGRON ER 50 MG PO TB24
50.0000 mg | ORAL_TABLET | Freq: Every day | ORAL | Status: DC
  Administered 2019-02-02 – 2019-02-09 (×8): 50 mg via ORAL
  Filled 2019-02-02 (×9): qty 1

## 2019-02-02 MED ORDER — ACETAMINOPHEN 325 MG PO TABS: 650.0000 mg | ORAL_TABLET | Freq: Four times a day (QID) | ORAL | Status: DC | PRN

## 2019-02-02 MED ORDER — TIZANIDINE HCL 4 MG PO TABS
4.0000 mg | ORAL_TABLET | Freq: Three times a day (TID) | ORAL | Status: DC
  Administered 2019-02-02 – 2019-02-09 (×20): 4 mg via ORAL
  Filled 2019-02-02 (×22): qty 1

## 2019-02-02 MED ORDER — BUPROPION HCL ER (XL) 150 MG PO TB24
300.0000 mg | ORAL_TABLET | Freq: Every day | ORAL | Status: DC
  Administered 2019-02-02 – 2019-02-03 (×2): 300 mg via ORAL
  Filled 2019-02-02 (×2): qty 2

## 2019-02-02 MED ORDER — ONDANSETRON HCL 4 MG PO TABS
4.0000 mg | ORAL_TABLET | Freq: Four times a day (QID) | ORAL | Status: DC | PRN
  Administered 2019-02-08: 17:00:00 4 mg via ORAL
  Filled 2019-02-02: qty 1

## 2019-02-02 MED ORDER — POTASSIUM CHLORIDE CRYS ER 10 MEQ PO TBCR
10.0000 meq | EXTENDED_RELEASE_TABLET | Freq: Every day | ORAL | Status: DC
  Administered 2019-02-02 – 2019-02-09 (×8): 10 meq via ORAL
  Filled 2019-02-02 (×8): qty 1

## 2019-02-02 MED ORDER — TRAMADOL HCL 50 MG PO TABS
50.0000 mg | ORAL_TABLET | Freq: Four times a day (QID) | ORAL | Status: DC | PRN
  Administered 2019-02-02 – 2019-02-08 (×7): 50 mg via ORAL
  Filled 2019-02-02 (×7): qty 1

## 2019-02-02 MED ORDER — ALBUTEROL SULFATE (2.5 MG/3ML) 0.083% IN NEBU: 2.5000 mg | INHALATION_SOLUTION | RESPIRATORY_TRACT | Status: DC | PRN

## 2019-02-02 MED ORDER — MOMETASONE FURO-FORMOTEROL FUM 200-5 MCG/ACT IN AERO
2.0000 | INHALATION_SPRAY | Freq: Two times a day (BID) | RESPIRATORY_TRACT | Status: DC
  Administered 2019-02-02 – 2019-02-09 (×14): 2 via RESPIRATORY_TRACT
  Filled 2019-02-02: qty 8.8

## 2019-02-02 MED ORDER — AMLODIPINE BESYLATE 5 MG PO TABS
5.0000 mg | ORAL_TABLET | Freq: Every day | ORAL | Status: DC
  Administered 2019-02-02 – 2019-02-09 (×7): 5 mg via ORAL
  Filled 2019-02-02 (×8): qty 1

## 2019-02-02 MED ORDER — QUETIAPINE FUMARATE ER 50 MG PO TB24
150.0000 mg | ORAL_TABLET | Freq: Every day | ORAL | Status: DC
  Administered 2019-02-02 – 2019-02-08 (×7): 150 mg via ORAL
  Filled 2019-02-02 (×8): qty 3

## 2019-02-02 MED ORDER — CEFAZOLIN SODIUM-DEXTROSE 1-4 GM/50ML-% IV SOLN
1.0000 g | Freq: Three times a day (TID) | INTRAVENOUS | Status: DC
  Administered 2019-02-02 – 2019-02-03 (×2): 1 g via INTRAVENOUS
  Filled 2019-02-02 (×4): qty 50

## 2019-02-02 MED ORDER — CEFAZOLIN SODIUM-DEXTROSE 1-4 GM/50ML-% IV SOLN
1.0000 g | Freq: Once | INTRAVENOUS | Status: AC
  Administered 2019-02-02: 1 g via INTRAVENOUS
  Filled 2019-02-02 (×2): qty 50

## 2019-02-02 MED ORDER — ACETAMINOPHEN 650 MG RE SUPP: 650.0000 mg | Freq: Four times a day (QID) | RECTAL | Status: DC | PRN

## 2019-02-02 MED ORDER — LISINOPRIL 20 MG PO TABS
20.0000 mg | ORAL_TABLET | Freq: Every day | ORAL | Status: DC
  Administered 2019-02-02 – 2019-02-09 (×7): 20 mg via ORAL
  Filled 2019-02-02 (×8): qty 1

## 2019-02-02 MED ORDER — INFLUENZA VAC A&B SA ADJ QUAD 0.5 ML IM PRSY
0.5000 mL | PREFILLED_SYRINGE | INTRAMUSCULAR | Status: AC
  Administered 2019-02-03: 11:00:00 0.5 mL via INTRAMUSCULAR
  Filled 2019-02-02: qty 0.5

## 2019-02-02 MED ORDER — ONDANSETRON HCL 4 MG/2ML IJ SOLN: 4.0000 mg | Freq: Four times a day (QID) | INTRAMUSCULAR | Status: DC | PRN

## 2019-02-02 MED ORDER — ENOXAPARIN SODIUM 40 MG/0.4ML ~~LOC~~ SOLN
40.0000 mg | Freq: Two times a day (BID) | SUBCUTANEOUS | Status: DC
  Administered 2019-02-02 – 2019-02-09 (×14): 40 mg via SUBCUTANEOUS
  Filled 2019-02-02 (×14): qty 0.4

## 2019-02-02 MED ORDER — CLONAZEPAM 0.5 MG PO TABS
0.5000 mg | ORAL_TABLET | Freq: Every day | ORAL | Status: DC
  Administered 2019-02-02 – 2019-02-08 (×7): 0.5 mg via ORAL
  Filled 2019-02-02 (×7): qty 1

## 2019-02-02 MED ORDER — HYDRALAZINE HCL 50 MG PO TABS
50.0000 mg | ORAL_TABLET | Freq: Three times a day (TID) | ORAL | Status: DC
  Administered 2019-02-02 – 2019-02-09 (×17): 50 mg via ORAL
  Filled 2019-02-02 (×19): qty 1

## 2019-02-02 MED ORDER — GABAPENTIN 600 MG PO TABS
600.0000 mg | ORAL_TABLET | Freq: Three times a day (TID) | ORAL | Status: DC
  Administered 2019-02-02 – 2019-02-09 (×20): 600 mg via ORAL
  Filled 2019-02-02 (×20): qty 1

## 2019-02-02 MED ORDER — DULOXETINE HCL 30 MG PO CPEP
60.0000 mg | ORAL_CAPSULE | ORAL | Status: DC
  Administered 2019-02-02 – 2019-02-09 (×8): 60 mg via ORAL
  Filled 2019-02-02 (×8): qty 2

## 2019-02-02 NOTE — H&P (Signed)
[facility] NAME: [name]    MR#:  [id number]  DATE OF BIRTH:  [birth year]  DATE OF ADMISSION:  02/02/2019  PRIMARY CARE PHYSICIAN: Kirk Ruths, MD   REQUESTING/REFERRING PHYSICIAN: Dr. Corky Downs  CHIEF COMPLAINT:   Chief Complaint  Patient presents with  . Weakness    HISTORY OF PRESENT ILLNESS:  [name]  is a 68 y.o. female with a known history of multiple sclerosis, hypertension, COPD, chronic dyspnea, chronic Foley catheter presents to the emergency room due to weakness and redness of her legs. Patient found to have UTI and cellulitis of bilateral lower extremities and is being admitted to the hospitalist service. Patient is also requesting that she be placed in nursing home at discharge.  Has been unable to care for her or help at home.  PAST MEDICAL HISTORY:   Past Medical History:  Diagnosis Date  . Abdominal aortic atherosclerosis ([place]) 11/11/2016  . ADHD   . Anxiety   . COPD (chronic obstructive pulmonary disease) ([place])   . Depression    major depressive  . Dyspnea    doe  . Edema    left leg  . Follicular lymphoma ([place])    B Cell  . Follicular lymphoma grade II ([place])   . Hypertension   . Hypotension    idiopathic  . Kyphoscoliosis and scoliosis 11/26/2011  . Morbid obesity ([place]) 01/05/2016  . Multiple sclerosis ([place])   . Multiple sclerosis ([place])    1980's  . Neuromuscular disorder ([place])   . Obstructive and reflux uropathy    foley  . Pain    atypical facial  . Peripheral vascular disease of lower extremity with ulceration ([place]) 11/08/2015  . Skin ulcer ([place]) 11/08/2015  . Weakness    generalized. has MS    PAST SURGICAL HISTORY:   Past Surgical History:  Procedure Laterality Date  . BACK SURGERY N/A 2002  . CYST EXCISION     lower back  . INGUINAL LYMPH NODE BIOPSY Left 07/04/2016   Procedure: INGUINAL LYMPH NODE BIOPSY;  Surgeon: Christene Lye, MD;  Location: [facility] ORS;   Service: General;  Laterality: Left;  . PORTACATH PLACEMENT N/A 07/22/2016   Procedure: INSERTION PORT-A-CATH;  Surgeon: Christene Lye, MD;  Location: [facility] ORS;  Service: General;  Laterality: N/A;  . TONSILLECTOMY AND ADENOIDECTOMY    . TUBAL LIGATION      SOCIAL HISTORY:   Social History   Tobacco Use  . Smoking status: Former Smoker    Packs/day: 1.00    Years: 20.00    Pack years: 20.00    Types: Cigarettes    Start date: 04/30/1995    Quit date: 02/03/2016    Years since quitting: 3.0  . Smokeless tobacco: Never Used  Substance Use Topics  . Alcohol use: No    Alcohol/week: 0.0 standard drinks    FAMILY HISTORY:   Family History  Problem Relation Age of Onset  . COPD Mother   . Diabetes Mother   . Heart failure Mother   . Alcohol abuse Father   . Kidney disease Father   . Kidney failure Father   . Arthritis Sister   . CAD Maternal Grandmother   . Stroke Maternal Grandfather   . Arthritis Sister   . Mental illness Sister   . Arthritis Brother     DRUG ALLERGIES:  No Known Allergies  REVIEW OF SYSTEMS:   Review of Systems  Constitutional: Positive  for malaise/fatigue. Negative for chills, fever and weight loss.  HENT: Negative for hearing loss and nosebleeds.   Eyes: Negative for blurred vision, double vision and pain.  Respiratory: Negative for cough, hemoptysis, sputum production, shortness of breath and wheezing.   Cardiovascular: Negative for chest pain, palpitations, orthopnea and leg swelling.  Gastrointestinal: Negative for abdominal pain, constipation, diarrhea, nausea and vomiting.  Genitourinary: Negative for dysuria and hematuria.  Musculoskeletal: Negative for back pain, falls and myalgias.  Skin: Negative for rash.  Neurological: Negative for dizziness, tremors, sensory change, speech change, focal weakness, seizures and headaches.  Endo/Heme/Allergies: Does not bruise/bleed easily.  Psychiatric/Behavioral: Negative for depression and  memory loss. The patient is not nervous/anxious.    MEDICATIONS AT HOME:   Prior to Admission medications   Medication Sig Start Date End Date Taking? Authorizing Provider  acetaminophen (TYLENOL) 325 MG tablet Take 2 tablets (650 mg total) by mouth every 6 (six) hours as needed for mild pain (or Fever >/= 101). 01/08/19  Yes Gouru, Aruna, MD  albuterol (VENTOLIN HFA) 108 (90 Base) MCG/ACT inhaler Inhale 1 puff into the lungs every 4 (four) hours as needed for shortness of breath. 07/07/18  Yes [provider]  amLODipine (NORVASC) 5 MG tablet Take 5 mg by mouth daily. 11/13/18  Yes [provider]  atorvastatin (LIPITOR) 10 MG tablet Take 10 mg by mouth daily.  01/14/19  Yes [provider]  budesonide-formoterol (SYMBICORT) 160-4.5 MCG/ACT inhaler Inhale 2 puffs into the lungs 2 (two) times daily. 07/08/18  Yes [provider]  buPROPion (WELLBUTRIN XL) 300 MG 24 hr tablet Take 300 mg by mouth daily.  01/21/19  Yes [provider]  clonazePAM (KLONOPIN) 1 MG tablet Take 0.5 tablets (0.5 mg total) by mouth at bedtime. 06/14/18  Yes Demetrios Loll, MD  docusate sodium (COLACE) 100 MG capsule Take 1 capsule (100 mg total) by mouth 2 (two) times daily. Patient taking differently: Take 100 mg by mouth 2 (two) times daily as needed.  01/08/19  Yes Gouru, Illene Silver, MD  DULoxetine (CYMBALTA) 60 MG capsule Take 1 capsule (60 mg total) by mouth every morning. 10/14/16  Yes Vaughan Basta, MD  gabapentin (NEURONTIN) 600 MG tablet Take 1 tablet (600 mg total) by mouth 3 (three) times daily. 02/09/17  Yes Mody, Ulice Bold, MD  hydrALAZINE (APRESOLINE) 50 MG tablet Take 50 mg by mouth 3 (three) times daily. 11/13/18  Yes [provider]  interferon beta-1a (AVONEX) 30 MCG/0.5ML PSKT injection Inject 30 mcg into the muscle every Monday.   Yes [provider]  lisinopril (ZESTRIL) 20 MG tablet Take 20 mg by mouth daily. 12/02/18  Yes [provider]   Multiple Vitamin (MULTIVITAMIN WITH MINERALS) TABS tablet Take 1 tablet by mouth every evening.    Yes [provider]  MYRBETRIQ 50 MG TB24 tablet TAKE ONE TABLET BY MOUTH ONCE DAILY (FOR OVERACTIVE BLADDER) 12/03/18  Yes Stoioff, Ronda Fairly, MD  potassium chloride (KLOR-CON) 10 MEQ tablet Take 10 mEq by mouth daily.  01/18/19  Yes [provider]  QUEtiapine Fumarate (SEROQUEL XR) 150 MG 24 hr tablet Take 150 mg by mouth at bedtime.    Yes [provider]  Teriflunomide (AUBAGIO) 14 MG TABS Take by mouth. 12/15/18  Yes [provider]  tiZANidine (ZANAFLEX) 4 MG tablet Take 4 mg by mouth 3 (three) times daily. 10/06/18  Yes [provider]  traMADol (ULTRAM) 50 MG tablet Take 1 tablet (50 mg total) by mouth every 6 (six)  hours as needed for up to 20 doses for moderate pain. 01/08/19  Yes Gouru, Illene Silver, MD  vitamin C (ASCORBIC ACID) 500 MG tablet Take 500 mg by mouth every evening.    Yes [provider]     VITAL SIGNS:  Blood pressure (!) 162/56, pulse 82, temperature 98.3 F (36.8 C), temperature source Oral, resp. rate 14, height 5\' 4"  (1.626 m), weight 136.1 kg, SpO2 95 %.  PHYSICAL EXAMINATION:  Physical Exam  GENERAL:  68 y.o.-year-old patient lying in the bed with no acute distress.  EYES: Pupils equal, round, reactive to light and accommodation. No scleral icterus. Extraocular muscles intact.  HEENT: Head atraumatic, normocephalic. Oropharynx and nasopharynx clear. No oropharyngeal erythema, moist oral mucosa  NECK:  Supple, no jugular venous distention. No thyroid enlargement, no tenderness.  LUNGS: Normal breath sounds bilaterally, no wheezing, rales, rhonchi. No use of accessory muscles of respiration.  CARDIOVASCULAR: S1, S2 normal. No murmurs, rubs, or gallops.  ABDOMEN: Soft, nontender, nondistended. Bowel sounds present. No organomegaly or mass.  EXTREMITIES: No pedal edema, cyanosis, or clubbing. + 2 pedal & radial pulses b/l.    NEUROLOGIC: Cranial nerves II through XII are intact. No focal Motor or sensory deficits appreciated b/l PSYCHIATRIC: The patient is alert and oriented x 3. Good affect.  SKIN: No obvious rash, lesion, or ulcer.   LABORATORY PANEL:   CBC Recent Labs  Lab 02/02/19 1345  WBC 5.3  HGB 10.3*  HCT 32.5*  PLT 219   ------------------------------------------------------------------------------------------------------------------  Chemistries  Recent Labs  Lab 02/02/19 1345  NA 137  K 4.3  CL 103  CO2 25  GLUCOSE 101*  BUN 17  CREATININE 1.35*  CALCIUM 9.1  AST 24  ALT 18  ALKPHOS 66  BILITOT 0.6   ------------------------------------------------------------------------------------------------------------------  Cardiac Enzymes No results for input(s): TROPONINI in the last 168 hours. ------------------------------------------------------------------------------------------------------------------  RADIOLOGY:  No results found.   IMPRESSION AND PLAN:   *Bilateral lower extremity cellulitis Started on Ancef in the emergency room.  Agree with this.  We will continue.  Can change to Keflex at discharge.  *Abnormal urinalysis.  No suprapubic pain.  Does have chronic Foley and likely colonized.  Foley catheter changed.  On IV Ancef.  Culture sent and pending.  *Generalized weakness likely due to acute infection.  Physical therapy evaluation.  Patient is requesting placement in a nursing home.  Social work consulted.  *Hypertension.  Continue home medications.  DVT prophylaxis with Lovenox  All the records are reviewed and case discussed with ED provider. Management plans discussed with the patient, family and they are in agreement.  CODE STATUS: FULL CODE  TOTAL TIME TAKING CARE OF THIS PATIENT: 40 minutes.   Leia Alf Jamieon Lannen M.D on 02/02/2019 at 3:55 PM  Between 7am to 6pm - Pager - [phone]  After 6pm go to [email or web address] - password [email or web address] [facility]  Office  [phone]  CC: Primary care physician; Kirk Ruths, MD  Note: This dictation was prepared with Dragon dictation along with smaller phrase technology. Any transcriptional errors that result from this process are unintentional.

## 2019-02-02 NOTE — ED Triage Notes (Signed)
Pt arrived from home via [facility] c/o weakness w/ hx of falls.

## 2019-02-02 NOTE — Consult Note (Signed)
[facility] Nurse wound consult note Reason for Consult:device related pressure injury to right upper leg.  Moisture associated skin damage to buttocks and perineum.  This area has been radiated as well.  Has chronic foley catheter related to acontractile bladder and MS  Wound type:MASD and partial thickness skin damage from catheter tubing.   Pressure Injury POA: Yes Measurement:scattered nonintact lesions to buttocks and perineum Wound [id number] and moist Drainage (amount, consistency, odor) minimal weeping Periwound:intact Dressing procedure/placement/frequency: Cleanse buttocks and perineal skin with soap and water and pat dry.  Apply barrier cream twice daily and PRn soilage.  No disposable briefs or underpads.   Will not follow at this time.  Please re-consult if needed.  [name] MSN, RN, FNP-BC CWON Wound, Ostomy, Continence Nurse Pager [phone]

## 2019-02-02 NOTE — Progress Notes (Signed)
Advance care planning  Purpose of Encounter Cellulitis  Parties in Attendance Patient  Patients Decisional capacity Alert and oriented.  Able to make medical decision. No documented healthcare power of attorney but she mentions husband would make decisions if she is unable to.  Discussed in detail regarding cellulitis, generalized weakness..  Treatment plan , prognosis discussed.  All questions answered.  Patient lives at home with husband.  Needs significant help and husband is unable to help her.  She is requesting placement.  CODE STATUS discussed and patient wishes for aggressive medical care with CPR/defibrillation/intubation if needed  Orders entered and CODE STATUS changed  FULL CODE  Time spent - 17 minutes

## 2019-02-02 NOTE — ED Provider Notes (Signed)
[facility] Emergency Department Provider Note   ____________________________________________    I have reviewed the triage vital signs and the nursing notes.   HISTORY  Chief Complaint Weakness     HPI [name] is a 68 y.o. female with a history of multiple sclerosis, chronic indwelling Foley who presents with complaints of weakness and fall.  Patient reports she was unable to get to her dining room table where she usually sits.  She fell to the floor.  He denies injury.  She reports her legs feel painful and seems somewhat weaker than usual although she is chronically weak.  Denies fevers.  Does report redness to her legs which is unusual.  Past Medical History:  Diagnosis Date  . Abdominal aortic atherosclerosis ([place]) 11/11/2016  . ADHD   . Anxiety   . COPD (chronic obstructive pulmonary disease) ([place])   . Depression    major depressive  . Dyspnea    doe  . Edema    left leg  . Follicular lymphoma ([place])    B Cell  . Follicular lymphoma grade II ([place])   . Hypertension   . Hypotension    idiopathic  . Kyphoscoliosis and scoliosis 11/26/2011  . Morbid obesity ([place]) 01/05/2016  . Multiple sclerosis ([place])   . Multiple sclerosis ([place])    1980's  . Neuromuscular disorder ([place])   . Obstructive and reflux uropathy    foley  . Pain    atypical facial  . Peripheral vascular disease of lower extremity with ulceration ([place]) 11/08/2015  . Skin ulcer ([place]) 11/08/2015  . Weakness    generalized. has MS    Patient Active Problem List   Diagnosis Date Noted  . Ovarian mass, left 01/27/2019  . Sepsis ([place]) 01/05/2019  . UTI (urinary tract infection) 06/13/2018  . Altered mental status 06/11/2018  . Fall 05/13/2018  . Depression 05/13/2018  . Recurrent cellulitis of lower extremity 06/25/2017  . Medication monitoring encounter 06/05/2017  . Cellulitis of left lower leg 04/25/2017  . Adjustment disorder with mixed disturbance of emotions and  conduct 04/23/2017  . Foot pain, bilateral 02/03/2017  . Tinea pedis 02/03/2017  . Left ovarian cyst 12/09/2016  . Abdominal aortic atherosclerosis ([place]) 11/11/2016  . Swelling of lower extremity 10/11/2016  . Obstructive sleep apnea 10/11/2016  . Follicular lymphoma of intra-abdominal lymph nodes ([place]) 08/06/2016  . Multiple falls 06/07/2016  . Inguinal adenopathy 05/29/2016  . Obesity, morbid ([place]) 01/05/2016  . Low HDL (under 40) 12/19/2015  . Skin ulcer ([place]) 11/08/2015  . Peripheral vascular disease of lower extremity with ulceration ([place]) 11/08/2015  . CKD (chronic kidney disease) stage 3, GFR 30-59 ml/min 11/08/2015  . Constipation due to pain medication 01/31/2015  . Obstructive sleep apnea of adult 01/13/2015  . Pelvic muscle wasting 01/13/2015  . Incomplete bladder emptying 01/13/2015  . Headache, migraine 10/24/2014  . 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 Current tobacco use 10/24/2014  . Lumbar radiculopathy, chronic 10/02/2013  . COPD with bronchial hyperresponsiveness ([place]) 10/02/2013  . Major depressive disorder, recurrent, in partial remission ([place]) 10/02/2013  . Hypertension goal BP (blood pressure) < 140/90 10/02/2013  . Multiple sclerosis ([place]) 10/02/2013  . Absence of bladder continence 09/25/2012  . Acontractile bladder 02/12/2012  . Narrowing of intervertebral disc space 11/26/2011  . Kyphoscoliosis and scoliosis 11/26/2011    Past Surgical History:  Procedure Laterality Date  . BACK SURGERY N/A 2002  . CYST EXCISION     lower back  .  INGUINAL LYMPH NODE BIOPSY Left 07/04/2016   Procedure: INGUINAL LYMPH NODE BIOPSY;  Surgeon: Christene Lye, MD;  Location: [facility] ORS;  Service: General;  Laterality: Left;  . PORTACATH PLACEMENT N/A 07/22/2016   Procedure: INSERTION PORT-A-CATH;  Surgeon: Christene Lye, MD;  Location: [facility] ORS;  Service: General;  Laterality: N/A;  . TONSILLECTOMY AND ADENOIDECTOMY    . TUBAL LIGATION      Prior to Admission  medications   Medication Sig Start Date End Date Taking? Authorizing Provider  acetaminophen (TYLENOL) 325 MG tablet Take 2 tablets (650 mg total) by mouth every 6 (six) hours as needed for mild pain (or Fever >/= 101). 01/08/19   Gouru, Illene Silver, MD  albuterol (VENTOLIN HFA) 108 (90 Base) MCG/ACT inhaler Inhale 1 puff into the lungs every 4 (four) hours as needed for shortness of breath. 07/07/18   [provider]  amLODipine (NORVASC) 5 MG tablet Take 5 mg by mouth daily. 11/13/18   [provider]  atorvastatin (LIPITOR) 10 MG tablet  01/14/19   [provider]  baclofen (LIORESAL) 10 MG tablet  01/09/19   [provider]  BIOTIN PO Take by mouth daily. Pt taking 500 mcg daily    [provider]  budesonide-formoterol (SYMBICORT) 160-4.5 MCG/ACT inhaler Inhale 2 puffs into the lungs 2 (two) times daily. 07/08/18   [provider]  buPROPion (WELLBUTRIN XL) 300 MG 24 hr tablet  01/21/19   [provider]  cefTRIAXone (ROCEPHIN) 10 g injection  01/08/19   [provider]  clonazePAM (KLONOPIN) 1 MG tablet Take 0.5 tablets (0.5 mg total) by mouth at bedtime. 06/14/18   Demetrios Loll, MD  docusate sodium (COLACE) 100 MG capsule Take 1 capsule (100 mg total) by mouth 2 (two) times daily. 01/08/19   Gouru, Illene Silver, MD  DULoxetine (CYMBALTA) 60 MG capsule Take 1 capsule (60 mg total) by mouth every morning. 10/14/16   Vaughan Basta, MD  gabapentin (NEURONTIN) 600 MG tablet Take 1 tablet (600 mg total) by mouth 3 (three) times daily. 02/09/17   Bettey Costa, MD  hydrALAZINE (APRESOLINE) 50 MG tablet Take 50 mg by mouth 3 (three) times daily. 11/13/18   [provider]  interferon beta-1a (AVONEX) 30 MCG/0.5ML PSKT injection Inject 30 mcg into the muscle every Monday.    [provider]  lisinopril (ZESTRIL) 20 MG tablet Take 20 mg by mouth daily. 12/02/18   [provider]  Multiple Vitamin (MULTIVITAMIN WITH MINERALS)  TABS tablet Take 1 tablet by mouth every evening.     [provider]  MYRBETRIQ 50 MG TB24 tablet TAKE ONE TABLET BY MOUTH ONCE DAILY (FOR OVERACTIVE BLADDER) 12/03/18   Stoioff, Ronda Fairly, MD  polyethylene glycol powder (GLYCOLAX/MIRALAX) powder Take 17 g by mouth daily as needed for mild constipation. Mixed in water Patient taking differently: Take 17 g by mouth daily as needed for mild constipation. For constipation 01/06/17   [name], MD  potassium chloride (KLOR-CON) 10 MEQ tablet  01/18/19   [provider]  QUEtiapine Fumarate (SEROQUEL XR) 150 MG 24 hr tablet Take 150 mg by mouth at bedtime.     [provider]  Teriflunomide (AUBAGIO) 14 MG TABS Take by mouth. 12/15/18   [provider]  tiZANidine (ZANAFLEX) 4 MG tablet Take 4 mg by mouth 3 (three) times daily. 10/06/18   [provider]  traMADol (ULTRAM) 50 MG tablet Take 1 tablet (50 mg total) by mouth every 6 (six) hours as  needed for up to 20 doses for moderate pain. 01/08/19   Nicholes Mango, MD  vitamin C (ASCORBIC ACID) 500 MG tablet Take 500 mg by mouth every evening.     [provider]     Allergies Patient has no known allergies.  Family History  Problem Relation Age of Onset  . COPD Mother   . Diabetes Mother   . Heart failure Mother   . Alcohol abuse Father   . Kidney disease Father   . Kidney failure Father   . Arthritis Sister   . CAD Maternal Grandmother   . Stroke Maternal Grandfather   . Arthritis Sister   . Mental illness Sister   . Arthritis Brother     Social History Social History   Tobacco Use  . Smoking status: Former Smoker    Packs/day: 1.00    Years: 20.00    Pack years: 20.00    Types: Cigarettes    Start date: 04/30/1995    Quit date: 02/03/2016    Years since quitting: 3.0  . Smokeless tobacco: Never Used  Substance Use Topics  . Alcohol use: No    Alcohol/week: 0.0 standard drinks  . Drug use: Yes    Types: Marijuana    Comment:  smokes THC occasionally per pt     Review of Systems  Constitutional: No fever/chills Eyes: No visual changes.  ENT: No sore throat. Cardiovascular: Denies chest pain. Respiratory: Denies shortness of breath. Gastrointestinal: No abdominal pain.  N  Genitourinary: Negative for dysuria. Musculoskeletal: As above Skin: As above Neurological: Negative for headaches   ____________________________________________   PHYSICAL EXAM:  VITAL SIGNS: ED Triage Vitals  Enc Vitals Group     BP 02/02/19 1246 (!) 139/57     Pulse Rate 02/02/19 1246 85     Resp 02/02/19 1246 15     Temp 02/02/19 1246 98.3 F (36.8 C)     Temp Source 02/02/19 1246 Oral     SpO2 02/02/19 1246 98 %     Weight 02/02/19 1249 136.1 kg (300 lb)     Height 02/02/19 1249 1.626 m (5\' 4" )     Head Circumference --      Peak Flow --      Pain Score 02/02/19 1246 5     Pain Loc --      Pain Edu? --      Excl. in [place]? --     Constitutional: Alert and oriented.  Eyes: Conjunctivae are normal.  Head: No injuries  Mouth/Throat: Mucous membranes are moist.   Neck:  Painless ROM Cardiovascular: Normal rate, regular rhythm. Grossly normal heart sounds.  Good peripheral circulation. Respiratory: Normal respiratory effort.  No retractions. Lungs CTAB. Gastrointestinal: Soft and nontender. No distention.  No CVA tenderness.  Musculoskeletal: Swelling to the lower extremities, mild warm and well perfused Neurologic:  Normal speech and language. No gross focal neurologic deficits are appreciated.  Skin:  Skin is warm, dry.  Both lower extremities lower legs: Erythematous with evidence of abrasion/wound ulceration.  Pressure wounds to the upper posterior legs Psychiatric: Mood and affect are normal. Speech and behavior are normal.  ____________________________________________   LABS (all labs ordered are listed, but only abnormal results are displayed)  Labs Reviewed  CBC WITH DIFFERENTIAL/PLATELET - Abnormal;  Notable for the following components:      Result Value   RBC 3.65 (*)    Hemoglobin 10.3 (*)    HCT 32.5 (*)    Lymphs Abs 0.4 (*)  All other components within normal limits  COMPREHENSIVE METABOLIC PANEL - Abnormal; Notable for the following components:   Glucose, Bld 101 (*)    Creatinine, Ser 1.35 (*)    GFR calc non Af Amer 40 (*)    GFR calc Af Amer 47 (*)    All other components within normal limits  URINALYSIS, COMPLETE (UACMP) WITH MICROSCOPIC - Abnormal; Notable for the following components:   Color, Urine YELLOW (*)    APPearance HAZY (*)    Leukocytes,Ua LARGE (*)    WBC, UA >50 (*)    Bacteria, UA RARE (*)    All other components within normal limits  SARS CORONAVIRUS 2 (TAT 6-24 HRS)  URINE CULTURE  TROPONIN I (HIGH SENSITIVITY)   ____________________________________________  EKG  None ____________________________________________  RADIOLOGY  None ____________________________________________   PROCEDURES  Procedure(s) performed: No  Procedures   Critical Care performed: No ____________________________________________   INITIAL IMPRESSION / ASSESSMENT AND PLAN / ED COURSE  Pertinent labs & imaging results that were available during my care of the patient were reviewed by me and considered in my medical decision making (see chart for details).  Patient presents with generalized weakness, possible cellulitis to the lower extremities, multiple shallow ulcerations, indwelling Foley question UTI, pending culture.  Will treat cellulitis with Ancef, will require admission for treatment, PT eval    ____________________________________________   FINAL CLINICAL IMPRESSION(S) / ED DIAGNOSES  Final diagnoses:  Cellulitis, unspecified cellulitis site  Generalized weakness        Note:  This document was prepared using Dragon voice recognition software and may include unintentional dictation errors.   Lavonia Drafts, MD 02/02/19 [phone]

## 2019-02-03 DIAGNOSIS — L8990 Pressure ulcer of unspecified site, unspecified stage: Secondary | ICD-10-CM | POA: Insufficient documentation

## 2019-02-03 LAB — SARS CORONAVIRUS 2 (TAT 6-24 HRS): SARS Coronavirus 2: NEGATIVE

## 2019-02-03 LAB — CBC
HCT: 28.8 % — ABNORMAL LOW (ref 36.0–46.0)
Hemoglobin: 9 g/dL — ABNORMAL LOW (ref 12.0–15.0)
MCH: 27.8 pg (ref 26.0–34.0)
MCHC: 31.3 g/dL (ref 30.0–36.0)
MCV: 88.9 fL (ref 80.0–100.0)
Platelets: 184 10*3/uL (ref 150–400)
RBC: 3.24 MIL/uL — ABNORMAL LOW (ref 3.87–5.11)
RDW: 14 % (ref 11.5–15.5)
WBC: 4.2 10*3/uL (ref 4.0–10.5)
nRBC: 0 % (ref 0.0–0.2)

## 2019-02-03 LAB — BASIC METABOLIC PANEL
Anion gap: 7 (ref 5–15)
BUN: 15 mg/dL (ref 8–23)
CO2: 25 mmol/L (ref 22–32)
Calcium: 8.8 mg/dL — ABNORMAL LOW (ref 8.9–10.3)
Chloride: 107 mmol/L (ref 98–111)
Creatinine, Ser: 1.14 mg/dL — ABNORMAL HIGH (ref 0.44–1.00)
GFR calc Af Amer: 57 mL/min — ABNORMAL LOW (ref >60)
GFR calc non Af Amer: 49 mL/min — ABNORMAL LOW (ref >60)
Glucose, Bld: 87 mg/dL (ref 70–99)
Potassium: 4 mmol/L (ref 3.5–5.1)
Sodium: 139 mmol/L (ref 135–145)

## 2019-02-03 MED ORDER — CEFAZOLIN SODIUM-DEXTROSE 2-4 GM/100ML-% IV SOLN
2.0000 g | Freq: Three times a day (TID) | INTRAVENOUS | Status: DC
  Administered 2019-02-03 – 2019-02-06 (×6): 2 g via INTRAVENOUS
  Filled 2019-02-03 (×12): qty 100

## 2019-02-03 MED ORDER — TERIFLUNOMIDE 14 MG PO TABS
14.0000 mg | ORAL_TABLET | Freq: Every day | ORAL | Status: DC
  Administered 2019-02-06 – 2019-02-09 (×4): 14 mg via ORAL
  Filled 2019-02-03 (×2): qty 1

## 2019-02-03 MED ORDER — BENZOCAINE 10 % MT GEL
Freq: Four times a day (QID) | OROMUCOSAL | Status: DC | PRN
  Administered 2019-02-05: 1 via OROMUCOSAL
  Filled 2019-02-03: qty 9

## 2019-02-03 MED ORDER — IPRATROPIUM-ALBUTEROL 0.5-2.5 (3) MG/3ML IN SOLN
3.0000 mL | Freq: Four times a day (QID) | RESPIRATORY_TRACT | Status: DC
  Administered 2019-02-03 (×2): 3 mL via RESPIRATORY_TRACT
  Filled 2019-02-03 (×2): qty 3

## 2019-02-03 MED ORDER — IPRATROPIUM-ALBUTEROL 0.5-2.5 (3) MG/3ML IN SOLN
3.0000 mL | Freq: Three times a day (TID) | RESPIRATORY_TRACT | Status: DC
  Administered 2019-02-04: 3 mL via RESPIRATORY_TRACT
  Filled 2019-02-03: qty 3

## 2019-02-03 MED ORDER — BUPROPION HCL ER (XL) 150 MG PO TB24
450.0000 mg | ORAL_TABLET | Freq: Every day | ORAL | Status: DC
  Administered 2019-02-04 – 2019-02-09 (×6): 450 mg via ORAL
  Filled 2019-02-03 (×6): qty 3

## 2019-02-03 NOTE — Progress Notes (Signed)
[facility] NAME: [name]    MR#:  [id number]  DATE OF BIRTH:  09-Sep-1950  SUBJECTIVE:  CHIEF COMPLAINT: Patient is reporting her legs are swollen and erythematous for a few days.  Feeling sad as she is dependent on her husband and lately he has been getting frustrated Patient has chronic multiple sclerosis, bedbound  REVIEW OF SYSTEMS:  CONSTITUTIONAL: No fever, fatigue or weakness.  EYES: No blurred or double vision.  EARS, NOSE, AND THROAT: No tinnitus or ear pain.  RESPIRATORY: No cough, shortness of breath, wheezing or hemoptysis.  CARDIOVASCULAR: No chest pain, orthopnea, edema.  GASTROINTESTINAL: No nausea, vomiting, diarrhea or abdominal pain.  GENITOURINARY: No dysuria, hematuria.  ENDOCRINE: No polyuria, nocturia,  HEMATOLOGY: No anemia, easy bruising or bleeding SKIN: LOWER EXTREMITY REDNESS AND SWELLING MUSCULOSKELETAL: No joint pain or arthritis.   NEUROLOGIC: No tingling, numbness, weakness.  PSYCHIATRY: No anxiety or depression.   DRUG ALLERGIES:  No Known Allergies  VITALS:  Blood pressure (!) 130/54, pulse 73, temperature 99 F (37.2 C), temperature source Oral, resp. rate 19, height 5\' 4"  (1.626 m), weight 136.1 kg, SpO2 96 %.  PHYSICAL EXAMINATION:  GENERAL:  68 y.o.-year-old patient lying in the bed with no acute distress.  EYES: Pupils equal, round, reactive to light and accommodation. No scleral icterus. Extraocular muscles intact.  HEENT: Head atraumatic, normocephalic. Oropharynx and nasopharynx clear.  NECK:  Supple, no jugular venous distention. No thyroid enlargement, no tenderness.  LUNGS: Normal breath sounds bilaterally, no wheezing, rales,rhonchi or crepitation. No use of accessory muscles of respiration.  CARDIOVASCULAR: S1, S2 normal. No murmurs, rubs, or gallops.  ABDOMEN: Soft, nontender, nondistended. Bowel sounds present. No organomegaly or mass.  EXTREMITIES: No pedal edema,  cyanosis, or clubbing.  NEUROLOGIC: Cranial nerves II through XII are intact. Muscle strength 5/5 in all extremities. Sensation intact. Gait not checked.  PSYCHIATRIC: The patient is alert and oriented x 3.  SKIN: Bilateral lower extremities are erythematous positive desquamation no discharge positive edema 1-2+   LABORATORY PANEL:   CBC Recent Labs  Lab 02/03/19 0411  WBC 4.2  HGB 9.0*  HCT 28.8*  PLT 184   ------------------------------------------------------------------------------------------------------------------  Chemistries  Recent Labs  Lab 02/02/19 1345 02/03/19 0411  NA 137 139  K 4.3 4.0  CL 103 107  CO2 25 25  GLUCOSE 101* 87  BUN 17 15  CREATININE 1.35* 1.14*  CALCIUM 9.1 8.8*  AST 24  --   ALT 18  --   ALKPHOS 66  --   BILITOT 0.6  --    ------------------------------------------------------------------------------------------------------------------  Cardiac Enzymes No results for input(s): TROPONINI in the last 168 hours. ------------------------------------------------------------------------------------------------------------------  RADIOLOGY:  No results found.  EKG:   Orders placed or performed during the hospital encounter of 02/02/19  . ED EKG  . ED EKG    ASSESSMENT AND PLAN:   Bilateral lower extremity cellulitis Continue on Ancef   , await clinical improvement and at the time of discharge may consider changing it to Keflex Keep bilateral lower extremities elevated  *Abnormal urinalysis.  No suprapubic pain.  Does have chronic Foley and likely colonized.  Foley catheter changed.  On IV Ancef.  Culture sent and pending.  *Chronic multiple sclerosis Patient is bedbound Reposition patient every 1-2 hours  *Possible depression Psychiatry consult placed and notify Dr. Claris Gower Denies any suicidal or homicidal ideation  *Hypertension.  Continue home medications.   *Generalized weakness likely due to acute  infection.   Physical therapy is recommending skilled nursing facility.  Patient is agreeable Social worker is following   DVT prophylaxis with Lovenox     All the records are reviewed and case discussed with Care Management/Social Workerr. Management plans discussed with the patient, family and they are in agreement.  CODE STATUS: fc   TOTAL TIME TAKING CARE OF THIS PATIENT:  35 minutes.   POSSIBLE D/C IN  2 DAYS, DEPENDING ON CLINICAL CONDITION.  Note: This dictation was prepared with Dragon dictation along with smaller phrase technology. Any transcriptional errors that result from this process are unintentional.   [name] M.D on 02/03/2019 at 12:27 PM  Between 7am to 6pm - Pager - [phone] After 6pm go to [email or web address] - password [email or web address] [facility]  [facility]  Office  [phone]  CC: Primary care physician; [name] Ruths, MD

## 2019-02-03 NOTE — Progress Notes (Signed)
PHARMACY NOTE:  ANTIMICROBIAL DOSAGE ADJUSTMENT   Current antimicrobial dosage:  Cefazolin 1 g IV q8h   Indication: cellulitis UTI  Renal Function:  Estimated Creatinine Clearance: 65.1 mL/min (A) (by C-G formula based on SCr of 1.14 mg/dL (H)).  Body mass index is 51.49 kg/m.     Antimicrobial dosage has been changed to:  Cefazolin 2 g IV q8h based on weight   Thank you for allowing pharmacy to be a part of this patient's care.  Rocky Morel, [facility] 02/03/2019 1:50 PM

## 2019-02-03 NOTE — Evaluation (Signed)
Physical Therapy Evaluation Patient Details Name: [name] MRN: [id number] DOB: 1951/02/09 Today's Date: 02/03/2019   History of Present Illness  Pt is 68 y.o. female with a known history of multiple sclerosis, hypertension, COPD, chronic dyspnea, chronic Foley catheter presents to the emergency room due to weakness and redness of her legs. Patient found to have UTI and cellulitis of bilateral lower extremities.    Clinical Impression  Patient alert, oriented, flat affect during session, did express unhappiness about her current status and situation, MD and RN notified. Pt reported she lives with her husband in a one story home, does have two steps to enter her home after her ramp. She states in the last several weeks she has been having more weakness. At baseline she is able to ambulate household distances with a rollator, needs assistance with ADLs, husband performs IADLs. Stated she is falling at least once a week for a while now.  The patient with motivation agreed to participate with therapy. Supine to sit modA and heavy use of grab bars. ModA needed until patient achieved midline positioning at the EOB, progressed to supervision with fair sitting balance for several minutes. The patient was also able to minimally laterally scoot to her L towards the head of the bed with CGA, but needed extended rest breaks due to SOB/fatigue. Sit <> Stand with RW and modA. The patient was able to take several side steps towards the head of bed, but reported fatigue and exhibited mild unsteadiness, further mobility held. MaxAx2 to return to supine safely (pt normally sleeps in her lift chair).  Overall the patient demonstrated deficits (see "PT Problem List") that impede the patient's functional abilities, safety, and mobility and would benefit from skilled PT intervention. Recommendation is STR to maximize independence, decrease risk of falls, and to maximize mobility.      Follow Up Recommendations SNF     Equipment Recommendations  Other (comment)(TBD at next venue of care)    Recommendations for Other Services       Precautions / Restrictions Precautions Precautions: Fall Precaution Comments: LE cellulitis, hx of MS, some  L foot drop noted upon evaluation Restrictions Weight Bearing Restrictions: No      Mobility  Bed Mobility Overal bed mobility: Needs Assistance Bed Mobility: Supine to Sit;Sit to Supine     Supine to sit: Mod assist;HOB elevated Sit to supine: Max assist;+2 for physical assistance   General bed mobility comments: Pt does not sleep in a bed normally  Transfers Overall transfer level: Needs assistance Equipment used: Rolling walker (2 wheeled) Transfers: Sit to/from Stand Sit to Stand: Mod assist;+2 physical assistance         General transfer comment: Pt needs significant time/momentum to complete transfer. Pulls very heavily on RW  Ambulation/Gait Ambulation/Gait assistance: Min assist;+2 physical assistance Gait Distance (Feet): 1 Feet Assistive device: Rolling walker (2 wheeled)       General Gait Details: Pt able to side step towards HOB to her L with minAx2. Generally unsteady, pt fatigued quickly and reported she was ready to sit.  Stairs            Wheelchair Mobility    Modified Rankin (Stroke Patients Only)       Balance Overall balance assessment: Needs assistance Sitting-balance support: Feet supported Sitting balance-Leahy Scale: Fair Sitting balance - Comments: Once sitting EOB balance Fair, but until patient achieved position modA to maintain upright     Standing balance-Leahy Scale: Poor  Pertinent Vitals/Pain Pain Assessment: No/denies pain    Home Living Family/patient expects to be discharged to:: Private residence Living Arrangements: Spouse/significant other Available Help at Discharge: Family;Available 24 hours/day Type of Home: House Home Access: Ramped  entrance;Stairs to enter Entrance Stairs-Rails: Right Entrance Stairs-Number of Steps: per Pt ramp is not "stabilized" Home Layout: One level Home Equipment: Walker - 4 wheels;Bedside commode;Grab bars - tub/shower;Shower seat Additional Comments: pt reports 2 steps into house after ramp to get on porch    Prior Function Level of Independence: Needs assistance   Gait / Transfers Assistance Needed: Pt uses a 4WW for limited household distances; sleeps in a lift chair which assists her with STS transfers.  ADL's / Homemaking Assistance Needed: assistance needed with bathing and dressing  Comments: Pt stated she is falling at least once a week. Stated she has gotten weaker in the last several weeks     Hand Dominance   Dominant Hand: Right    Extremity/Trunk Assessment   Upper Extremity Assessment Upper Extremity Assessment: Generalized weakness    Lower Extremity Assessment Lower Extremity Assessment: Generalized weakness    Cervical / Trunk Assessment Cervical / Trunk Assessment: Kyphotic  Communication   Communication: No difficulties  Cognition Arousal/Alertness: Awake/alert Behavior During Therapy: WFL for tasks assessed/performed Overall Cognitive Status: Within Functional Limits for tasks assessed                                 General Comments: Pt A&Ox4, though often sad and expressed unhappiness about current situation/status. MD notified      General Comments      Exercises     Assessment/Plan    PT Assessment Patient needs continued PT services  PT Problem List Decreased strength;Decreased mobility;Decreased activity tolerance;Decreased balance       PT Treatment Interventions DME instruction;Therapeutic activities;Gait training;Therapeutic exercise;Stair training;Balance training;Neuromuscular re-education    PT Goals (Current goals can be found in the Care Plan section)  Acute Rehab PT Goals Patient Stated Goal: to get the help she  needs PT Goal Formulation: With patient Time For Goal Achievement: 02/17/19 Potential to Achieve Goals: Good    Frequency Min 2X/week   Barriers to discharge Inaccessible home environment      Co-evaluation               AM-PAC PT "6 Clicks" Mobility  Outcome Measure Help needed turning from your back to your side while in a flat bed without using bedrails?: A Lot Help needed moving from lying on your back to sitting on the side of a flat bed without using bedrails?: Total Help needed moving to and from a bed to a chair (including a wheelchair)?: A Lot Help needed standing up from a chair using your arms (e.g., wheelchair or bedside chair)?: A Lot Help needed to walk in hospital room?: A Lot Help needed climbing 3-5 steps with a railing? : Total 6 Click Score: 10    End of Session Equipment Utilized During Treatment: Gait belt Activity Tolerance: Patient limited by fatigue Patient left: in bed;with bed alarm set;with call bell/phone within reach Nurse Communication: Mobility status PT Visit Diagnosis: Unsteadiness on feet (R26.81);Other abnormalities of gait and mobility (R26.89);Difficulty in walking, not elsewhere classified (R26.2)    Time: 1019-1050 PT Time Calculation (min) (ACUTE ONLY): 31 min   Charges:   PT Evaluation $PT Eval Moderate Complexity: 1 Mod PT Treatments $Therapeutic Exercise: 8-22 mins $Therapeutic Activity:  8-22 mins      Lieutenant Diego PT, [place] 11:42 AM,02/03/19 [phone]

## 2019-02-03 NOTE — NC FL2 (Signed)
[place] LEVEL OF CARE SCREENING TOOL     IDENTIFICATION  Patient Name: [name] Birthdate: [birth year] Sex: female Admission Date (Current Location): 02/02/2019  [place] and [place] Number:  Engineering geologist and Address:  [facility], [address], [place], [place] [address]      Provider Number: [id number]  Attending Physician Name and Address:  Nicholes Mango, MD  Relative Name and Phone Number:       Current Level of Care: Hospital Recommended Level of Care: [place] Prior Approval Number:    Date Approved/Denied:   PASRR Number: Manual review  Discharge Plan: SNF    Current Diagnoses: Patient Active Problem List   Diagnosis Date Noted  . Pressure injury of skin 02/03/2019  . Bilateral lower leg cellulitis 02/02/2019  . Ovarian mass, left 01/27/2019  . Sepsis ([place]) 01/05/2019  . UTI (urinary tract infection) 06/13/2018  . Altered mental status 06/11/2018  . Fall 05/13/2018  . Depression 05/13/2018  . Recurrent cellulitis of lower extremity 06/25/2017  . Medication monitoring encounter 06/05/2017  . Cellulitis of left lower leg 04/25/2017  . Adjustment disorder with mixed disturbance of emotions and conduct 04/23/2017  . Foot pain, bilateral 02/03/2017  . Tinea pedis 02/03/2017  . Left ovarian cyst 12/09/2016  . Abdominal aortic atherosclerosis ([place]) 11/11/2016  . Swelling of lower extremity 10/11/2016  . Obstructive sleep apnea 10/11/2016  . Follicular lymphoma of intra-abdominal lymph nodes ([place]) 08/06/2016  . Multiple falls 06/07/2016  . Inguinal adenopathy 05/29/2016  . Obesity, morbid ([place]) 01/05/2016  . Low HDL (under 40) 12/19/2015  . Skin ulcer ([place]) 11/08/2015  . Peripheral vascular disease of lower extremity with ulceration ([place]) 11/08/2015  . CKD (chronic kidney disease) stage 3, GFR 30-59 ml/min 11/08/2015  . Constipation due to pain medication 01/31/2015  . Obstructive sleep apnea  of adult 01/13/2015  . Pelvic muscle wasting 01/13/2015  . Incomplete bladder emptying 01/13/2015  . Headache, migraine 10/24/2014  . 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 Current tobacco use 10/24/2014  . Lumbar radiculopathy, chronic 10/02/2013  . COPD with bronchial hyperresponsiveness ([place]) 10/02/2013  . Major depressive disorder, recurrent, in partial remission ([place]) 10/02/2013  . Hypertension goal BP (blood pressure) < 140/90 10/02/2013  . Multiple sclerosis ([place]) 10/02/2013  . Absence of bladder continence 09/25/2012  . Acontractile bladder 02/12/2012  . Narrowing of intervertebral disc space 11/26/2011  . Kyphoscoliosis and scoliosis 11/26/2011    Orientation RESPIRATION BLADDER Height & Weight     Self, Time, Situation, Place  Normal Indwelling catheter Weight: 300 lb (136.1 kg) Height:  5\' 4"  (162.6 cm)  BEHAVIORAL SYMPTOMS/MOOD NEUROLOGICAL BOWEL NUTRITION STATUS  (None) (None) Continent Diet(Heart healthy)  AMBULATORY STATUS COMMUNICATION OF NEEDS Skin   Extensive Assist Verbally Skin abrasions, Bruising, PU Stage and Appropriate Care(Blister, MASD, Weeping.)   PU Stage 2 Dressing: (Right upper leg: Foam (lift every shift to assess) prn.)                   Personal Care Assistance Level of Assistance  Bathing, Feeding, Dressing Bathing Assistance: Limited assistance Feeding assistance: Independent Dressing Assistance: Limited assistance     Functional Limitations Info  Sight, Hearing, Speech Sight Info: Adequate Hearing Info: Adequate Speech Info: Adequate    SPECIAL CARE FACTORS FREQUENCY  PT (By licensed PT)     PT Frequency: 5 x week              Contractures Contractures Info: Not present  Additional Factors Info  Code Status, Allergies, Psychotropic Code Status Info: Full code Allergies Info: NKDA Psychotropic Info: Depression: Wellbutrin XL 300 mg PO daily, Klonopin 0.5 mg PO QHS, Cymbalta DR 60 mg PO QAM, Seroquel XR 150 mg PO QHS          Current Medications (02/03/2019):  This is the current hospital active medication list Current Facility-Administered Medications  Medication Dose Route Frequency Provider Last Rate Last Dose  . acetaminophen (TYLENOL) tablet 650 mg  650 mg Oral Q6H PRN [name], MD       Or  . acetaminophen (TYLENOL) suppository 650 mg  650 mg Rectal Q6H PRN Sudini, Srikar, MD      . amLODipine (NORVASC) tablet 5 mg  5 mg Oral Daily [name], MD   5 mg at 02/03/19 1102  . atorvastatin (LIPITOR) tablet 10 mg  10 mg Oral Daily [name], MD   10 mg at 02/03/19 1102  . benzocaine (ORAJEL) 10 % mucosal gel   Mouth/Throat QID PRN Gouru, Aruna, MD      . buPROPion (WELLBUTRIN XL) 24 hr tablet 300 mg  300 mg Oral Daily Sudini, Srikar, MD   300 mg at 02/03/19 1101  . ceFAZolin (ANCEF) IVPB 2g/100 mL premix  2 g Intravenous Q8H Rocky Morel, RPH      . Chlorhexidine Gluconate Cloth 2 % PADS 6 each  6 each Topical Daily [name], MD   6 each at 02/03/19 1110  . clonazePAM (KLONOPIN) tablet 0.5 mg  0.5 mg Oral QHS [name], MD   0.5 mg at 02/02/19 2023  . DULoxetine (CYMBALTA) DR capsule 60 mg  60 mg Oral Teresa Coombs, Srikar, MD   60 mg at 02/03/19 1102  . enoxaparin (LOVENOX) injection 40 mg  40 mg Subcutaneous Q12H [name], MD   40 mg at 02/03/19 1103  . gabapentin (NEURONTIN) tablet 600 mg  600 mg Oral TID [name], MD   600 mg at 02/03/19 1102  . hydrALAZINE (APRESOLINE) tablet 50 mg  50 mg Oral TID [name], MD   50 mg at 02/03/19 1101  . ipratropium-albuterol (DUONEB) 0.5-2.5 (3) MG/3ML nebulizer solution 3 mL  3 mL Nebulization Q6H Gouru, Aruna, MD   3 mL at 02/03/19 1416  . lisinopril (ZESTRIL) tablet 20 mg  20 mg Oral Daily [name], MD   20 mg at 02/03/19 1102  . mirabegron ER (MYRBETRIQ) tablet 50 mg  50 mg Oral Daily [name], MD   50 mg at 02/03/19 1102  . mometasone-formoterol (DULERA) 200-5 MCG/ACT inhaler 2 puff  2 puff Inhalation BID  [name], MD   2 puff at 02/03/19 1103  . ondansetron (ZOFRAN) tablet 4 mg  4 mg Oral Q6H PRN [name], MD       Or  . ondansetron (ZOFRAN) injection 4 mg  4 mg Intravenous Q6H PRN Sudini, Srikar, MD      . polyethylene glycol (MIRALAX / GLYCOLAX) packet 17 g  17 g Oral Daily PRN Sudini, Srikar, MD      . potassium chloride (KLOR-CON) CR tablet 10 mEq  10 mEq Oral Daily [name], MD   10 mEq at 02/03/19 1103  . QUEtiapine (SEROQUEL XR) 24 hr tablet 150 mg  150 mg Oral QHS [name], MD   150 mg at 02/02/19 2023  . tiZANidine (ZANAFLEX) tablet 4 mg  4 mg Oral TID [name], MD   4 mg at 02/03/19 1102  .  traMADol (ULTRAM) tablet 50 mg  50 mg Oral Q6H PRN [name], MD   50 mg at 02/03/19 1103   Facility-Administered Medications Ordered in Other Encounters  Medication Dose Route Frequency Provider Last Rate Last Dose  . heparin lock flush 100 unit/mL  500 Units Intracatheter Once PRN Sindy Guadeloupe, MD         Discharge Medications: Please see discharge summary for a list of discharge medications.  Relevant Imaging Results:  Relevant Lab Results:   Additional Information SS#: [id number]  Candie Chroman, LCSW
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
## 2019-02-03 NOTE — Consult Note (Signed)
[facility] Psychiatry Consult   Reason for Consult:  Depression Referring Physician:  Darlen Round Patient Identification: [name] MRN:  [id number] Principal Diagnosis: <principal problem not specified> Diagnosis:  Active Problems:   Bilateral lower leg cellulitis   Pressure injury of skin Depression  Total Time spent with patient: 1 hour  Subjective:   [name] is a 68 y.o. female patient admitted with bilateral cellulitis.  HPI: Patient is a 68 year old female with history of MS and anxiety and depression who presented due to weakness in her lower extremities.  Psychiatry consult was called due to patient displaying symptoms of depression during her admission.  Patient also has a history of psychiatric issues including depression and is currently taking psychiatric medication.  Patient states that she is feeling depressed about her situation because she has lost a lot of her mobility and hence her independence due to her MS diagnosis.  Patient remains hopeful that her condition will improve and she will become more mobile, however feels as if she will have to rely on her husband who is not fully capable of caring for her.  Patient states that she is currently retired but does have chores to tend to around the house.  Patient states that her symptoms are mostly low mood and low energy.  Patient states the symptoms have been present now for some time since she has been getting progressively worse with her MS.  Patient denies any suicidal ideation.  Patient denies any homicidal ideations.  Patient denies any manic symptoms or psychotic symptoms.  Most patients symptoms are complicated by a life stressors.  Patient has a son whom she no longer speaks to.  Patient states that she does have loved ones in her life who can support her including her husband and her eldest son.  Patient states that recently her eldest son has had to move further away due to work.   Past Psychiatric  History: Patient states she has not been on psychiatric medications for several years now.  Patient states the medications are helpful but feels she they are not controlling her symptoms.  Patient has psychiatric outpatient providers  Risk to Self:  no Risk to Others:  no Prior Inpatient Therapy:  no Prior Outpatient Therapy:  yes  Past Medical History:  Past Medical History:  Diagnosis Date  . Abdominal aortic atherosclerosis ([place]) 11/11/2016  . ADHD   . Anxiety   . COPD (chronic obstructive pulmonary disease) ([place])   . Depression    major depressive  . Dyspnea    doe  . Edema    left leg  . Follicular lymphoma ([place])    B Cell  . Follicular lymphoma grade II ([place])   . Hypertension   . Hypotension    idiopathic  . Kyphoscoliosis and scoliosis 11/26/2011  . Morbid obesity ([place]) 01/05/2016  . Multiple sclerosis ([place])   . Multiple sclerosis ([place])    1980's  . Neuromuscular disorder ([place])   . Obstructive and reflux uropathy    foley  . Pain    atypical facial  . Peripheral vascular disease of lower extremity with ulceration ([place]) 11/08/2015  . Skin ulcer ([place]) 11/08/2015  . Weakness    generalized. has MS    Past Surgical History:  Procedure Laterality Date  . BACK SURGERY N/A 2002  . CYST EXCISION     lower back  . INGUINAL LYMPH NODE BIOPSY Left 07/04/2016   Procedure: INGUINAL LYMPH NODE BIOPSY;  Surgeon: Andreas Newport  Jamal Collin, MD;  Location: [facility] ORS;  Service: General;  Laterality: Left;  . PORTACATH PLACEMENT N/A 07/22/2016   Procedure: INSERTION PORT-A-CATH;  Surgeon: Christene Lye, MD;  Location: [facility] ORS;  Service: General;  Laterality: N/A;  . TONSILLECTOMY AND ADENOIDECTOMY    . TUBAL LIGATION     Family History:  Family History  Problem Relation Age of Onset  . COPD Mother   . Diabetes Mother   . Heart failure Mother   . Alcohol abuse Father   . Kidney disease Father   . Kidney failure Father   . Arthritis Sister   . CAD Maternal Grandmother   .  Stroke Maternal Grandfather   . Arthritis Sister   . Mental illness Sister   . Arthritis Brother    Family Psychiatric  History: Denies Social History:  Social History   Substance and Sexual Activity  Alcohol Use No  . Alcohol/week: 0.0 standard drinks     Social History   Substance and Sexual Activity  Drug Use Yes  . Types: Marijuana   Comment: smokes THC occasionally per pt     Social History   Socioeconomic History  . Marital status: Married    Spouse name: Not on file  . Number of children: 3  . Years of education: Not on file  . Highest education level: Not on file  Occupational History  . Occupation: disabled  Social Needs  . Financial resource strain: Not on file  . Food insecurity    Worry: Not on file    Inability: Not on file  . Transportation needs    Medical: Not on file    Non-medical: Not on file  Tobacco Use  . Smoking status: Former Smoker    Packs/day: 1.00    Years: 20.00    Pack years: 20.00    Types: Cigarettes    Start date: 04/30/1995    Quit date: 02/03/2016    Years since quitting: 3.0  . Smokeless tobacco: Never Used  Substance and Sexual Activity  . Alcohol use: No    Alcohol/week: 0.0 standard drinks  . Drug use: Yes    Types: Marijuana    Comment: smokes THC occasionally per pt   . Sexual activity: Not Currently    Birth control/protection: None  Lifestyle  . Physical activity    Days per week: Not on file    Minutes per session: Not on file  . Stress: Not on file  Relationships  . Social Herbalist on phone: Not on file    Gets together: Not on file    Attends religious service: Not on file    Active member of club or organization: Not on file    Attends meetings of clubs or organizations: Not on file    Relationship status: Not on file  Other Topics Concern  . Not on file  Social History Narrative   She is married.    Additional Social History:    Allergies:  No Known Allergies  Labs:  Results for  orders placed or performed during the hospital encounter of 02/02/19 (from the past 48 hour(s))  CBC with Differential     Status: Abnormal   Collection Time: 02/02/19  1:45 PM  Result Value Ref Range   WBC 5.3 4.0 - 10.5 K/uL   RBC 3.65 (L) 3.87 - 5.11 MIL/uL   Hemoglobin 10.3 (L) 12.0 - 15.0 g/dL   HCT 32.5 (L) 36.0 - 46.0 %   MCV  89.0 80.0 - 100.0 fL   MCH 28.2 26.0 - 34.0 pg   MCHC 31.7 30.0 - 36.0 g/dL   RDW 14.2 11.5 - 15.5 %   Platelets 219 150 - 400 K/uL   nRBC 0.0 0.0 - 0.2 %   Neutrophils Relative % 74 %   Neutro Abs 4.0 1.7 - 7.7 K/uL   Lymphocytes Relative 8 %   Lymphs Abs 0.4 (L) 0.7 - 4.0 K/uL   Monocytes Relative 10 %   Monocytes Absolute 0.5 0.1 - 1.0 K/uL   Eosinophils Relative 6 %   Eosinophils Absolute 0.3 0.0 - 0.5 K/uL   Basophils Relative 2 %   Basophils Absolute 0.1 0.0 - 0.1 K/uL   Immature Granulocytes 0 %   Abs Immature Granulocytes 0.02 0.00 - 0.07 K/uL    Comment: Performed at [facility], [address] [place], [place] [address]  Comprehensive metabolic panel     Status: Abnormal   Collection Time: 02/02/19  1:45 PM  Result Value Ref Range   Sodium 137 135 - 145 mmol/L   Potassium 4.3 3.5 - 5.1 mmol/L   Chloride 103 98 - 111 mmol/L   CO2 25 22 - 32 mmol/L   Glucose, Bld 101 (H) 70 - 99 mg/dL   BUN 17 8 - 23 mg/dL   Creatinine, Ser 1.35 (H) 0.44 - 1.00 mg/dL   Calcium 9.1 8.9 - 10.3 mg/dL   Total Protein 7.2 6.5 - 8.1 g/dL   Albumin 3.7 3.5 - 5.0 g/dL   AST 24 15 - 41 U/L   ALT 18 0 - 44 U/L   Alkaline Phosphatase 66 38 - 126 U/L   Total Bilirubin 0.6 0.3 - 1.2 mg/dL   GFR calc non Af Amer 40 (L) >60 mL/min   GFR calc Af Amer 47 (L) >60 mL/min   Anion gap 9 5 - 15    Comment: Performed at [facility], [place], [place] [address]  Troponin I (High Sensitivity)     Status: None   Collection Time: 02/02/19  1:45 PM  Result Value Ref Range   Troponin I (High Sensitivity) 5 <18 ng/L    Comment:  (NOTE) Elevated high sensitivity troponin I (hsTnI) values and significant  changes across serial measurements may suggest ACS but many other  chronic and acute conditions are known to elevate hsTnI results.  Refer to the "Links" section for chest pain algorithms and additional  guidance. Performed at [facility], [address] [place],  [address]   Urinalysis, Complete w Microscopic     Status: Abnormal   Collection Time: 02/02/19  1:45 PM  Result Value Ref Range   Color, Urine YELLOW (A) YELLOW   APPearance HAZY (A) CLEAR   Specific Gravity, Urine 1.009 1.005 - 1.030   pH 5.0 5.0 - 8.0   Glucose, UA NEGATIVE NEGATIVE mg/dL   Hgb urine dipstick NEGATIVE NEGATIVE   Bilirubin Urine NEGATIVE NEGATIVE   Ketones, ur NEGATIVE NEGATIVE mg/dL   Protein, ur NEGATIVE NEGATIVE mg/dL   Nitrite NEGATIVE NEGATIVE   Leukocytes,Ua LARGE (A) NEGATIVE   RBC / HPF 0-5 0 - 5 RBC/hpf   WBC, UA >50 (H) 0 - 5 WBC/hpf   Bacteria, UA RARE (A) NONE SEEN   Squamous Epithelial / LPF 0-5 0 - 5   WBC Clumps PRESENT    Mucus PRESENT     Comment: Performed at [facility], [place], [place]  [address]  Urine Culture     Status: Abnormal (Preliminary result)   Collection Time: 02/02/19  1:45 PM   Specimen: Urine, Random  Result Value Ref Range   Specimen Description      URINE, RANDOM Performed at [facility], [address], [place], [place] [address]    Special Requests      NONE Performed at [facility], [place], [place] [address]    Culture (A)     >=100,000 COLONIES/mL GRAM NEGATIVE RODS IDENTIFICATION AND SUSCEPTIBILITIES TO FOLLOW Performed at [facility], [place] [address], [place], [place] [address]    Report Status PENDING   SARS CORONAVIRUS 2 (TAT 6-24 HRS) Nasopharyngeal Nasopharyngeal Swab     Status: None   Collection Time: 02/02/19  2:18 PM   Specimen: Nasopharyngeal Swab  Result Value Ref Range    SARS Coronavirus 2 NEGATIVE NEGATIVE    Comment: (NOTE) SARS-CoV-2 target nucleic acids are NOT DETECTED. The SARS-CoV-2 RNA is generally detectable in upper and lower respiratory specimens during the acute phase of infection. Negative results do not preclude SARS-CoV-2 infection, do not rule out co-infections with other pathogens, and should not be used as the sole basis for treatment or other patient management decisions. Negative results must be combined with clinical observations, patient history, and epidemiological information. The expected result is Negative. Fact Sheet for Patients: SugarRoll.be Fact Sheet for Healthcare Providers: https://www.woods-mathews.com/ This test is not yet approved or cleared by the [place] FDA and  has been authorized for detection and/or diagnosis of SARS-CoV-2 by FDA under an Emergency Use Authorization (EUA). This EUA will remain  in effect (meaning this test can be used) for the duration of the COVID-19 declaration under Section 56 4(b)(1) of the Act, 21 U.S.C. section 360bbb-3(b)(1), unless the authorization is terminated or revoked sooner. Performed at [facility], [place] [address] [place], [place] [id number]   Basic metabolic panel     Status: Abnormal   Collection Time: 02/03/19  4:11 AM  Result Value Ref Range   Sodium 139 135 - 145 mmol/L   Potassium 4.0 3.5 - 5.1 mmol/L   Chloride 107 98 - 111 mmol/L   CO2 25 22 - 32 mmol/L   Glucose, Bld 87 70 - 99 mg/dL   BUN 15 8 - 23 mg/dL   Creatinine, Ser 1.14 (H) 0.44 - 1.00 mg/dL   Calcium 8.8 (L) 8.9 - 10.3 mg/dL   GFR calc non Af Amer 49 (L) >60 mL/min   GFR calc Af Amer 57 (L) >60 mL/min   Anion gap 7 5 - 15    Comment: Performed at [facility], [address] [place], [place] [address]  CBC     Status: Abnormal   Collection Time: 02/03/19  4:11 AM  Result Value Ref Range   WBC 4.2 4.0 - 10.5 K/uL   RBC 3.24 (L) 3.87 -  5.11 MIL/uL   Hemoglobin 9.0 (L) 12.0 - 15.0 g/dL   HCT 28.8 (L) 36.0 - 46.0 %   MCV 88.9 80.0 - 100.0 fL   MCH 27.8 26.0 - 34.0 pg   MCHC 31.3 30.0 - 36.0 g/dL   RDW 14.0 11.5 - 15.5 %   Platelets 184 150 - 400 K/uL   nRBC 0.0 0.0 - 0.2 %    Comment: Performed at [facility], [address] [place], [place] [address]    Current Facility-Administered Medications  Medication Dose Route Frequency  Provider Last Rate Last Dose  . acetaminophen (TYLENOL) tablet 650 mg  650 mg Oral Q6H PRN [name], MD       Or  . acetaminophen (TYLENOL) suppository 650 mg  650 mg Rectal Q6H PRN Sudini, Srikar, MD      . amLODipine (NORVASC) tablet 5 mg  5 mg Oral Daily [name], MD   5 mg at 02/03/19 1102  . atorvastatin (LIPITOR) tablet 10 mg  10 mg Oral Daily [name], MD   10 mg at 02/03/19 1102  . benzocaine (ORAJEL) 10 % mucosal gel   Mouth/Throat QID PRN Gouru, Aruna, MD      . buPROPion (WELLBUTRIN XL) 24 hr tablet 300 mg  300 mg Oral Daily Sudini, Srikar, MD   300 mg at 02/03/19 1101  . ceFAZolin (ANCEF) IVPB 2g/100 mL premix  2 g Intravenous Q8H Rocky Morel, RPH      . Chlorhexidine Gluconate Cloth 2 % PADS 6 each  6 each Topical Daily [name], MD   6 each at 02/03/19 1110  . clonazePAM (KLONOPIN) tablet 0.5 mg  0.5 mg Oral QHS [name], MD   0.5 mg at 02/02/19 2023  . DULoxetine (CYMBALTA) DR capsule 60 mg  60 mg Oral Teresa Coombs, Srikar, MD   60 mg at 02/03/19 1102  . enoxaparin (LOVENOX) injection 40 mg  40 mg Subcutaneous Q12H [name], MD   40 mg at 02/03/19 1103  . gabapentin (NEURONTIN) tablet 600 mg  600 mg Oral TID [name], MD   600 mg at 02/03/19 1102  . hydrALAZINE (APRESOLINE) tablet 50 mg  50 mg Oral TID [name], MD   50 mg at 02/03/19 1101  . ipratropium-albuterol (DUONEB) 0.5-2.5 (3) MG/3ML nebulizer solution 3 mL  3 mL Nebulization Q6H Gouru, Aruna, MD   3 mL at 02/03/19 1416  . lisinopril (ZESTRIL) tablet 20 mg  20  mg Oral Daily [name], MD   20 mg at 02/03/19 1102  . mirabegron ER (MYRBETRIQ) tablet 50 mg  50 mg Oral Daily [name], MD   50 mg at 02/03/19 1102  . mometasone-formoterol (DULERA) 200-5 MCG/ACT inhaler 2 puff  2 puff Inhalation BID [name], MD   2 puff at 02/03/19 1103  . ondansetron (ZOFRAN) tablet 4 mg  4 mg Oral Q6H PRN [name], MD       Or  . ondansetron (ZOFRAN) injection 4 mg  4 mg Intravenous Q6H PRN Sudini, Srikar, MD      . polyethylene glycol (MIRALAX / GLYCOLAX) packet 17 g  17 g Oral Daily PRN Sudini, Srikar, MD      . potassium chloride (KLOR-CON) CR tablet 10 mEq  10 mEq Oral Daily [name], MD   10 mEq at 02/03/19 1103  . QUEtiapine (SEROQUEL XR) 24 hr tablet 150 mg  150 mg Oral QHS [name], MD   150 mg at 02/02/19 2023  . tiZANidine (ZANAFLEX) tablet 4 mg  4 mg Oral TID [name], MD   4 mg at 02/03/19 1102  . traMADol (ULTRAM) tablet 50 mg  50 mg Oral Q6H PRN [name], MD   50 mg at 02/03/19 1103   Facility-Administered Medications Ordered in Other Encounters  Medication Dose Route Frequency Provider Last Rate Last Dose  . heparin lock flush 100 unit/mL  500 Units Intracatheter Once PRN Sindy Guadeloupe, MD        Musculoskeletal: Strength & Muscle Tone: decreased Gait &  Station: unsteady Patient leans: Right  Psychiatric Specialty Exam: Physical Exam  Review of Systems  Constitutional: Positive for malaise/fatigue.  Neurological: Positive for weakness.  Psychiatric/Behavioral: Positive for depression. Negative for hallucinations, memory loss, substance abuse and suicidal ideas. The patient is not nervous/anxious and does not have insomnia.     Blood pressure (!) 130/54, pulse 73, temperature 99 F (37.2 C), temperature source Oral, resp. rate 19, height 5\' 4"  (1.626 m), weight 136.1 kg, SpO2 96 %.Body mass index is 51.49 kg/m.  General Appearance: Fairly Groomed  Eye Contact:  Good  Speech:  Normal Rate  Volume:   Normal  Mood:  Dysphoric  Affect:  Congruent and Depressed  Thought Process:  Coherent  Orientation:  Full (Time, Place, and Person)  Thought Content:  Logical  Suicidal Thoughts:  No  Homicidal Thoughts:  No  Memory:  Recent;   Good Remote;   Good  Judgement:  Intact  Insight:  Good  Psychomotor Activity:  Normal  Concentration:  Concentration: Fair  Recall:  Good  Fund of Knowledge:  Good  Language:  Good  Akathisia:  No  Handed:  Right  AIMS (if indicated):     Assets:  Communication Skills Desire for Improvement Financial Resources/Insurance Housing Leisure Time Resilience Social Support  ADL's:  Intact  Cognition:  WNL  Sleep:        Treatment Plan Summary:  Assessment: 68 year old patient with worsening depression due to medical issues especially lack of mobility and independence.  Despite this patient remains hopeful and is connected with psychiatric care.  Would benefit from slight increase in longstanding medication regimen.  Daily contact with patient to assess and evaluate symptoms and progress in treatment and Medication management   Medications: Increase Wellbutrin to 450mg  qam  Disposition: No evidence of imminent risk to self or others at present.   Patient does not meet criteria for psychiatric inpatient admission. Supportive therapy provided about ongoing stressors.  Dixie Dials, MD 02/03/2019 2:43 PM

## 2019-02-04 ENCOUNTER — Other Ambulatory Visit: Payer: Self-pay | Admitting: Infectious Diseases

## 2019-02-04 ENCOUNTER — Ambulatory Visit: Payer: Medicare Other | Admitting: Physician Assistant

## 2019-02-04 DIAGNOSIS — Z87891 Personal history of nicotine dependence: Secondary | ICD-10-CM

## 2019-02-04 DIAGNOSIS — N319 Neuromuscular dysfunction of bladder, unspecified: Secondary | ICD-10-CM

## 2019-02-04 DIAGNOSIS — L03115 Cellulitis of right lower limb: Principal | ICD-10-CM

## 2019-02-04 DIAGNOSIS — Z8619 Personal history of other infectious and parasitic diseases: Secondary | ICD-10-CM

## 2019-02-04 DIAGNOSIS — J449 Chronic obstructive pulmonary disease, unspecified: Secondary | ICD-10-CM

## 2019-02-04 DIAGNOSIS — I10 Essential (primary) hypertension: Secondary | ICD-10-CM

## 2019-02-04 DIAGNOSIS — Z872 Personal history of diseases of the skin and subcutaneous tissue: Secondary | ICD-10-CM

## 2019-02-04 DIAGNOSIS — Z79899 Other long term (current) drug therapy: Secondary | ICD-10-CM

## 2019-02-04 DIAGNOSIS — Z9181 History of falling: Secondary | ICD-10-CM

## 2019-02-04 DIAGNOSIS — L03116 Cellulitis of left lower limb: Secondary | ICD-10-CM

## 2019-02-04 DIAGNOSIS — F329 Major depressive disorder, single episode, unspecified: Secondary | ICD-10-CM

## 2019-02-04 DIAGNOSIS — G35 Multiple sclerosis: Secondary | ICD-10-CM

## 2019-02-04 DIAGNOSIS — Z960 Presence of urogenital implants: Secondary | ICD-10-CM

## 2019-02-04 LAB — URINE CULTURE: Culture: >=100000 — AB

## 2019-02-04 MED ORDER — IPRATROPIUM-ALBUTEROL 0.5-2.5 (3) MG/3ML IN SOLN
3.0000 mL | Freq: Two times a day (BID) | RESPIRATORY_TRACT | Status: DC
  Administered 2019-02-04 – 2019-02-05 (×2): 3 mL via RESPIRATORY_TRACT
  Filled 2019-02-04 (×2): qty 3

## 2019-02-04 MED ORDER — VANCOMYCIN HCL 10 G IV SOLR
2500.0000 mg | Freq: Once | INTRAVENOUS | Status: DC
  Filled 2019-02-04: qty 2500

## 2019-02-04 NOTE — TOC Progression Note (Addendum)
Transition of Care [facility]) - Progression Note    Patient Details  Name: [name] MRN: [id number] Date of Birth: 1951-04-27  Transition of Care [facility]) CM/SW [place], LCSW Phone Number: 02/04/2019, 10:00 AM  Clinical Narrative:  Faxed requested documents to [place] Must for PASARR review.   2:08 pm: Patient's PASARR under level 2 review. [facility] requires her to have a PASARR before admission.  Expected Discharge Plan: [place] Barriers to Discharge: Continued Medical Work up  Expected Discharge Plan and Services Expected Discharge Plan: [place] Choice: [place] arrangements for the past 2 months: Single Family Home                                       Social Determinants of Health (SDOH) Interventions    Readmission Risk Interventions Readmission Risk Prevention Plan 02/04/2019 01/07/2019  Transportation Screening - Complete  PCP or Specialist Appt within 3-5 Days - Complete  HRI or [place] Complete Complete  Social Work Consult for [place] Planning/Counseling Complete Complete  Palliative Care Screening Not Applicable Not Applicable  Medication Review Press photographer) - Complete  Some recent data might be hidden

## 2019-02-04 NOTE — Progress Notes (Signed)
[facility] [place] NAME: [name]    MR#:  [id number]  DATE OF BIRTH:  Jan 24, 1951  SUBJECTIVE:  CHIEF COMPLAINT: Patient is reporting her legs are more erythematous today when compared to yesterday , started noticing some oozing discharge from the lower extremities  Patient has chronic multiple sclerosis, bedbound  REVIEW OF SYSTEMS:  CONSTITUTIONAL: No fever, fatigue or weakness.  EYES: No blurred or double vision.  EARS, NOSE, AND THROAT: No tinnitus or ear pain.  RESPIRATORY: No cough, shortness of breath, wheezing or hemoptysis.  CARDIOVASCULAR: No chest pain, orthopnea, edema.  GASTROINTESTINAL: No nausea, vomiting, diarrhea or abdominal pain.  GENITOURINARY: No dysuria, hematuria.  ENDOCRINE: No polyuria, nocturia,  HEMATOLOGY: No anemia, easy bruising or bleeding SKIN: LOWER EXTREMITY REDNESS AND SWELLING MUSCULOSKELETAL: No joint pain or arthritis.   NEUROLOGIC: No tingling, numbness, weakness.  PSYCHIATRY: No anxiety or depression.   DRUG ALLERGIES:  No Known Allergies  VITALS:  Blood pressure (!) 122/50, pulse 81, temperature 97.9 F (36.6 C), temperature source Oral, resp. rate 17, height 5\' 4"  (1.626 m), weight 136.1 kg, SpO2 97 %.  PHYSICAL EXAMINATION:  GENERAL:  68 y.o.-year-old patient lying in the bed with no acute distress.  EYES: Pupils equal, round, reactive to light and accommodation. No scleral icterus. Extraocular muscles intact.  HEENT: Head atraumatic, normocephalic. Oropharynx and nasopharynx clear.  NECK:  Supple, no jugular venous distention. No thyroid enlargement, no tenderness.  LUNGS: Normal breath sounds bilaterally, no wheezing, rales,rhonchi or crepitation. No use of accessory muscles of respiration.  CARDIOVASCULAR: S1, S2 normal. No murmurs, rubs, or gallops.  ABDOMEN: Soft, nontender, nondistended. Bowel sounds present.  EXTREMITIES: No pedal edema, cyanosis, or clubbing.  NEUROLOGIC:  Cranial nerves II through XII are intact. Muscle strength 5/5 in all extremities. Sensation intact. Gait not checked.  PSYCHIATRIC: The patient is alert and oriented x 3.  SKIN: Bilateral lower extremities are erythematous positive desquamation no discharge positive edema 1-2+   LABORATORY PANEL:   CBC Recent Labs  Lab 02/03/19 0411  WBC 4.2  HGB 9.0*  HCT 28.8*  PLT 184   ------------------------------------------------------------------------------------------------------------------  Chemistries  Recent Labs  Lab 02/02/19 1345 02/03/19 0411  NA 137 139  K 4.3 4.0  CL 103 107  CO2 25 25  GLUCOSE 101* 87  BUN 17 15  CREATININE 1.35* 1.14*  CALCIUM 9.1 8.8*  AST 24  --   ALT 18  --   ALKPHOS 66  --   BILITOT 0.6  --    ------------------------------------------------------------------------------------------------------------------  Cardiac Enzymes No results for input(s): TROPONINI in the last 168 hours. ------------------------------------------------------------------------------------------------------------------  RADIOLOGY:  No results found.  EKG:   Orders placed or performed during the hospital encounter of 02/02/19  . ED EKG  . ED EKG    ASSESSMENT AND PLAN:   Bilateral lower extremity cellulitis Patient is started on IV Ancef  continue on Ancef  , keep legs elevated  will get wound culture and sensitivity  Ace wrap  *Acute cystitis with Enterobacter  No suprapubic pain.  Does have chronic Foley and likely colonized.  Foley catheter changed.   Sensitivity to Cipro and Bactrim. ID consult placed to Dr. Levester Fresh to get her expert opinion before starting the patient on antibiotics for UTI  *Chronic multiple sclerosis Patient is bedbound Reposition patient every 1-2 hours  *Possible depression Psychiatry consult placed to Dr. Claris Gower; Wellbutrin dose increased Denies any suicidal or homicidal ideation  *Hypertension.  Continue home  medications.   *Generalized weakness likely due to acute infection.  Physical therapy is recommending skilled nursing facility.  Patient is agreeable Education officer, museum is following   DVT prophylaxis with Lovenox   Disposition skilled nursing facility passer pending  All the records are reviewed and case discussed with Care Management/Social Workerr. Management plans discussed with the patient, family and they are in agreement.  CODE STATUS: fc   TOTAL TIME TAKING CARE OF THIS PATIENT:  35 minutes.   POSSIBLE D/C IN  2 DAYS, DEPENDING ON CLINICAL CONDITION.  Note: This dictation was prepared with Dragon dictation along with smaller phrase technology. Any transcriptional errors that result from this process are unintentional.   Nicholes Mango M.D on 02/04/2019 at 4:02 PM  Between 7am to 6pm - Pager - [phone] After 6pm go to [email or web address] - password [email or web address] [facility]  [place] [facility]  Office  [phone]  CC: Primary care physician; Kirk Ruths, MD

## 2019-02-04 NOTE — Evaluation (Signed)
Occupational Therapy Evaluation Patient Details Name: [name] MRN: [id number] DOB: 07-May-1950 Today's Date: 02/04/2019    History of Present Illness Pt is 68 y.o. female with a known history of multiple sclerosis, hypertension, COPD, chronic dyspnea, chronic Foley catheter presents to the emergency room due to weakness and redness of her legs. Patient found to have UTI and cellulitis of bilateral lower extremities.   Clinical Impression   Pt is 68 year old female with hx of MS who lives at home with her husband and needs assistance for ADLs.  She was admitted with UTI and has redness in legs with drainage out of LLE.  She has adaptive equipment at home including reacher, sock aid, LH shoe horn and slip on tennis shoes.  She was able to transition from lying in bed to sitting at EOB with mod assist with help mainly to advance L hip forward.  She did well for first few minutes to assess LB dressing skills which require max assist and then started to get nauseated and felt dizzy and needed to lay down which needed max assist for LEs.  She was able to help use her arms to pull up in bed with rest breaks after dizziness subsided and BP was 133/56, pulse 77 and O2 sats 95%.  She was given a cold wash cloth for her head and all symptoms resolved by end of session.   Pt is currently limited in functional ADLs due to poor functional endurance, poor balance, decreased strength in UEs and LEs, pain, and nausea.  She would benefit from skilled OT services for education in assistive devices, functional mobility, energy conservation training and pt and family education.  Pt is a good candidate for SNF to continue rehabilitation.      Follow Up Recommendations  SNF    Equipment Recommendations  Other (comment)(new sock aid)    Recommendations for Other Services       Precautions / Restrictions Precautions Precautions: Fall Precaution Comments: LE cellulitis, hx of MS, L foot drop Restrictions Weight  Bearing Restrictions: No      Mobility Bed Mobility                  Transfers                      Balance                                           ADL either performed or assessed with clinical judgement   ADL Overall ADL's : Needs assistance/impaired Eating/Feeding: Set up;Minimal assistance;Bed level Eating/Feeding Details (indicate cue type and reason): assist to open packages Grooming: Wash/dry hands;Wash/dry face;Oral care;Brushing hair;Set up;Minimal assistance;Sitting Grooming Details (indicate cue type and reason): rest breaks needed when holding arms up to her head to brush hair Upper Body Bathing: Set up;Minimal assistance;Sitting   Lower Body Bathing: Maximal assistance;Set up;Bed level Lower Body Bathing Details (indicate cue type and reason): pt unable to reach feet when sitting at EOB and requires max assist Upper Body Dressing : Set up;Minimal assistance   Lower Body Dressing: Maximal assistance;Set up Lower Body Dressing Details (indicate cue type and reason): Pt able to sit at EOB but not able to stand this session due to feeling dizzy and nauseated.  BP 133/56 and pulse 77 and sats 95% and NSG updated.  General ADL Comments: Deferred any further mobility than sitting at EOB since she complained of feeling dizzy and nauseated and needed to lay down and vitals taken.  Also gave her cold wash cloth to head as well and symptoms resolved after about 5 minutes.     Vision Patient Visual Report: No change from baseline       Perception     Praxis      Pertinent Vitals/Pain Pain Assessment: No/denies pain     Hand Dominance Right   Extremity/Trunk Assessment Upper Extremity Assessment Upper Extremity Assessment: Generalized weakness   Lower Extremity Assessment Lower Extremity Assessment: Generalized weakness   Cervical / Trunk Assessment Cervical / Trunk Assessment: Kyphotic   Communication  Communication Communication: No difficulties   Cognition Arousal/Alertness: Awake/alert Behavior During Therapy: WFL for tasks assessed/performed Overall Cognitive Status: Within Functional Limits for tasks assessed                                 General Comments: Pt A&Ox4, though often sad and expressed unhappiness about current situation/status. MD aware from PT eval yesterday.  She also stated that her husband does not believe she has MS and thinks she is just lazy!   General Comments       Exercises     Shoulder Instructions      Home Living Family/patient expects to be discharged to:: Skilled nursing facility Living Arrangements: Spouse/significant other Available Help at Discharge: Family;Available 24 hours/day Type of Home: House Home Access: Ramped entrance;Stairs to enter Entrance Stairs-Number of Steps: per Pt ramp is not "stabilized" Entrance Stairs-Rails: Right Home Layout: One level     Bathroom Shower/Tub: Occupational psychologist: Standard     Home Equipment: Environmental consultant - 4 wheels;Bedside commode;Grab bars - tub/shower;Shower seat   Additional Comments: pt reports 2 steps into house after ramp to get on porch      Prior Functioning/Environment Level of Independence: Needs assistance  Gait / Transfers Assistance Needed: Pt uses a 4WW for limited household distances; sleeps in a lift chair which assists her with STS transfers. ADL's / Homemaking Assistance Needed: assistance needed with bathing and dressing.  She has reacher, sock aid and LH shoe horn but states she needs a new sock aid.   Comments: Pt stated she is falling at least once a week. Stated she has gotten weaker in the last several weeks        OT Problem List: Decreased strength;Decreased range of motion;Decreased activity tolerance;Impaired balance (sitting and/or standing);Obesity      OT Treatment/Interventions: Self-care/ADL training;Therapeutic  exercise;Patient/family education;Energy conservation;DME and/or AE instruction;Therapeutic activities    OT Goals(Current goals can be found in the care plan section) Acute Rehab OT Goals Patient Stated Goal: to get stronger and get back to cooking OT Goal Formulation: With patient Time For Goal Achievement: 02/18/19 Potential to Achieve Goals: Good ADL Goals Pt Will Perform Lower Body Dressing: with set-up;with min assist;with adaptive equipment;sit to/from stand Pt Will Transfer to Toilet: with set-up;stand pivot transfer;with min assist;regular height toilet;grab bars Pt/caregiver will Perform Home Exercise Program: Increased strength;With theraband;Both right and left upper extremity;With written HEP provided  OT Frequency: Min 1X/week   Barriers to D/C:            Co-evaluation              AM-PAC OT "6 Clicks" Daily Activity     Outcome Measure Help  from another person eating meals?: A Little Help from another person taking care of personal grooming?: A Little Help from another person toileting, which includes using toliet, bedpan, or urinal?: A Lot Help from another person bathing (including washing, rinsing, drying)?: A Lot Help from another person to put on and taking off regular upper body clothing?: A Little Help from another person to put on and taking off regular lower body clothing?: A Lot 6 Click Score: 15   End of Session Nurse Communication: (IV beeping and pt c/o dizziness and nausea that resolved and vitals ok with BP low which pt states is normal.)  Activity Tolerance: Other (comment)(limited due to dizziness and nausea) Patient left: in bed;with call bell/phone within reach;with bed alarm set  OT Visit Diagnosis: Repeated falls (R29.6);Muscle weakness (generalized) (M62.81);Other abnormalities of gait and mobility (R26.89)                Time: 1540-1620 OT Time Calculation (min): 40 min Charges:  OT General Charges $OT Visit: 1 Visit OT  Evaluation $OT Eval Low Complexity: 1 Low OT Treatments $Self Care/Home Management : 23-37 mins  Chrys Racer, OTR/L, [place] ascom [phone] 02/04/19, 4:44 PM

## 2019-02-04 NOTE — Consult Note (Signed)
NAME: [name]  DOB: 05/30/1950  MRN: [id number]  Date/Time: 02/04/2019 6:57 PM  REQUESTING PROVIDER: DR.Gouru Subjective:  REASON FOR CONSULT: UTI ? OVI DOKE is a 68 y.o. with a history of multiple sclerosis, COPD, edema legs with history of cellulitis, follicular lymphoma, hypertension, chronic Foley presents to the ED after a fall due to weakness of her legs and feeling tired- pt was recently in [facility] and was treated for GBS bacteremia in Sept 2020 due to left leg cellulitis Pt has no fever, chills, headache, nausea, vomiting, diarrhea, cough abdominal pain. Her legs are swollen , painful and red- as per her husband she hit her left leg on something. She has chronic foley for neurogenic bladder Past Medical History:  Diagnosis Date  . Abdominal aortic atherosclerosis ([place]) 11/11/2016  . ADHD   . Anxiety   . COPD (chronic obstructive pulmonary disease) ([place])   . Depression    major depressive  . Dyspnea    doe  . Edema    left leg  . Follicular lymphoma ([place])    B Cell  . Follicular lymphoma grade II ([place])   . Hypertension   . Hypotension    idiopathic  . Kyphoscoliosis and scoliosis 11/26/2011  . Morbid obesity ([place]) 01/05/2016  . Multiple sclerosis ([place])   . Multiple sclerosis ([place])    1980's  . Neuromuscular disorder ([place])   . Obstructive and reflux uropathy    foley  . Pain    atypical facial  . Peripheral vascular disease of lower extremity with ulceration ([place]) 11/08/2015  . Skin ulcer ([place]) 11/08/2015  . Weakness    generalized. has MS    Past Surgical History:  Procedure Laterality Date  . BACK SURGERY N/A 2002  . CYST EXCISION     lower back  . INGUINAL LYMPH NODE BIOPSY Left 07/04/2016   Procedure: INGUINAL LYMPH NODE BIOPSY;  Surgeon: Christene Lye, MD;  Location: [facility] ORS;  Service: General;  Laterality: Left;  . PORTACATH PLACEMENT N/A 07/22/2016   Procedure: INSERTION PORT-A-CATH;  Surgeon: Christene Lye, MD;  Location: [facility] ORS;   Service: General;  Laterality: N/A;  . TONSILLECTOMY AND ADENOIDECTOMY    . TUBAL LIGATION      Social History   Socioeconomic History  . Marital status: Married    Spouse name: Not on file  . Number of children: 3  . Years of education: Not on file  . Highest education level: Not on file  Occupational History  . Occupation: disabled  Social Needs  . Financial resource strain: Not on file  . Food insecurity    Worry: Not on file    Inability: Not on file  . Transportation needs    Medical: Not on file    Non-medical: Not on file  Tobacco Use  . Smoking status: Former Smoker    Packs/day: 1.00    Years: 20.00    Pack years: 20.00    Types: Cigarettes    Start date: 04/30/1995    Quit date: 02/03/2016    Years since quitting: 3.0  . Smokeless tobacco: Never Used  Substance and Sexual Activity  . Alcohol use: No    Alcohol/week: 0.0 standard drinks  . Drug use: Yes    Types: Marijuana    Comment: smokes THC occasionally per pt   . Sexual activity: Not Currently    Birth control/protection: None  Lifestyle  . Physical activity    Days per week: Not on file  Minutes per session: Not on file  . Stress: Not on file  Relationships  . Social Herbalist on phone: Not on file    Gets together: Not on file    Attends religious service: Not on file    Active member of club or organization: Not on file    Attends meetings of clubs or organizations: Not on file    Relationship status: Not on file  . Intimate partner violence    Fear of current or ex partner: Not on file    Emotionally abused: Not on file    Physically abused: Not on file    Forced sexual activity: Not on file  Other Topics Concern  . Not on file  Social History Narrative   She is married.     Family History  Problem Relation Age of Onset  . COPD Mother   . Diabetes Mother   . Heart failure Mother   . Alcohol abuse Father   . Kidney disease Father   . Kidney failure Father   . Arthritis  Sister   . CAD Maternal Grandmother   . Stroke Maternal Grandfather   . Arthritis Sister   . Mental illness Sister   . Arthritis Brother    No Known Allergies   ? Current Facility-Administered Medications  Medication Dose Route Frequency Provider Last Rate Last Dose  . acetaminophen (TYLENOL) tablet 650 mg  650 mg Oral Q6H PRN [name], MD       Or  . acetaminophen (TYLENOL) suppository 650 mg  650 mg Rectal Q6H PRN Sudini, Srikar, MD      . amLODipine (NORVASC) tablet 5 mg  5 mg Oral Daily [name], MD   5 mg at 02/03/19 1102  . atorvastatin (LIPITOR) tablet 10 mg  10 mg Oral Daily [name], MD   10 mg at 02/04/19 0912  . benzocaine (ORAJEL) 10 % mucosal gel   Mouth/Throat QID PRN Gouru, Aruna, MD      . buPROPion (WELLBUTRIN XL) 24 hr tablet 450 mg  450 mg Oral Daily Cristofano, Dorene Ar, MD   450 mg at 02/04/19 0911  . ceFAZolin (ANCEF) IVPB 2g/100 mL premix  2 g Intravenous Q8H Rocky Morel, RPH 200 mL/hr at 02/04/19 1723 2 g at 02/04/19 1723  . Chlorhexidine Gluconate Cloth 2 % PADS 6 each  6 each Topical Daily [name], MD   6 each at 02/04/19 0914  . clonazePAM (KLONOPIN) tablet 0.5 mg  0.5 mg Oral QHS [name], MD   0.5 mg at 02/03/19 2105  . DULoxetine (CYMBALTA) DR capsule 60 mg  60 mg Oral Teresa Coombs, Alveta Heimlich, MD   60 mg at 02/04/19 0910  . enoxaparin (LOVENOX) injection 40 mg  40 mg Subcutaneous Q12H [name], MD   40 mg at 02/04/19 0911  . gabapentin (NEURONTIN) tablet 600 mg  600 mg Oral TID [name], MD   600 mg at 02/04/19 1538  . hydrALAZINE (APRESOLINE) tablet 50 mg  50 mg Oral TID [name], MD   50 mg at 02/03/19 1634  . ipratropium-albuterol (DUONEB) 0.5-2.5 (3) MG/3ML nebulizer solution 3 mL  3 mL Nebulization BID Gouru, Aruna, MD      . lisinopril (ZESTRIL) tablet 20 mg  20 mg Oral Daily Sudini, Srikar, MD   20 mg at 02/03/19 1102  . mirabegron ER (MYRBETRIQ) tablet 50 mg  50 mg Oral Daily Sudini, Alveta Heimlich, MD   50 mg  at  02/04/19 0910  . mometasone-formoterol (DULERA) 200-5 MCG/ACT inhaler 2 puff  2 puff Inhalation BID [name], MD   2 puff at 02/04/19 0911  . ondansetron (ZOFRAN) tablet 4 mg  4 mg Oral Q6H PRN [name], MD       Or  . ondansetron (ZOFRAN) injection 4 mg  4 mg Intravenous Q6H PRN Sudini, Srikar, MD      . polyethylene glycol (MIRALAX / GLYCOLAX) packet 17 g  17 g Oral Daily PRN Sudini, Srikar, MD      . potassium chloride (KLOR-CON) CR tablet 10 mEq  10 mEq Oral Daily [name], MD   10 mEq at 02/04/19 0911  . QUEtiapine (SEROQUEL XR) 24 hr tablet 150 mg  150 mg Oral QHS [name], MD   150 mg at 02/03/19 2105  . Teriflunomide TABS 14 mg  14 mg Oral Daily Gouru, Aruna, MD      . tiZANidine (ZANAFLEX) tablet 4 mg  4 mg Oral TID [name], MD   4 mg at 02/04/19 1538  . traMADol (ULTRAM) tablet 50 mg  50 mg Oral Q6H PRN [name], MD   50 mg at 02/03/19 1634   Facility-Administered Medications Ordered in Other Encounters  Medication Dose Route Frequency Provider Last Rate Last Dose  . heparin lock flush 100 unit/mL  500 Units Intracatheter Once PRN Sindy Guadeloupe, MD         Abtx:  Anti-infectives (From admission, onward)   Start     Dose/Rate Route Frequency Ordered Stop   02/04/19 1400  vancomycin (VANCOCIN) 2,500 mg in sodium chloride 0.9 % 500 mL IVPB  Status:  Discontinued     2,500 mg 250 mL/hr over 120 Minutes Intravenous  Once 02/04/19 1247 02/04/19 1308   02/03/19 1400  ceFAZolin (ANCEF) IVPB 2g/100 mL premix     2 g 200 mL/hr over 30 Minutes Intravenous Every 8 hours 02/03/19 1349     02/02/19 2330  ceFAZolin (ANCEF) IVPB 1 g/50 mL premix  Status:  Discontinued     1 g 100 mL/hr over 30 Minutes Intravenous Every 8 hours 02/02/19 1741 02/03/19 1349   02/02/19 1500  ceFAZolin (ANCEF) IVPB 1 g/50 mL premix     1 g 100 mL/hr over 30 Minutes Intravenous  Once 02/02/19 1449 02/02/19 1616      REVIEW OF SYSTEMS:  Const: negative fever, negative  chills, negative weight loss Eyes: negative diplopia or visual changes, negative eye pain ENT: negative coryza, negative sore throat Resp: negative cough, hemoptysis, dyspnea Cards: negative for chest pain, palpitations, lower extremity edema GU: negative for frequency, dysuria and hematuria GI: Negative for abdominal pain, diarrhea, bleeding, constipation Skin: negative for rash and pruritus Heme: negative for easy bruising and gum/nose bleeding MS: generalized weakness, walks with a rollator but does not walk much Neurolo:negative for headaches, dizziness, vertigo, memory problems  Psych: negative for feelings of anxiety, depression  Endocrine:no diabetes Allergy/Immunology- negative for any medication or food allergies  Objective:  VITALS:  BP (!) 133/56   Pulse 77   Temp 97.9 F (36.6 C) (Oral)   Resp 17   Ht 5\' 4"  (1.626 m)   Wt 136.1 kg   SpO2 95%   BMI 51.49 kg/m  PHYSICAL EXAM:  General: Alert, cooperative, no distress, appears stated age.  Head: Normocephalic, without obvious abnormality, atraumatic. Eyes: Conjunctivae clear, anicteric sclerae. Pupils are equal ENT Nares normal. No drainage or sinus tenderness. Lips, mucosa, and tongue normal. No Thrush  Neck: Supple, symmetrical, no adenopathy, thyroid: non tender no carotid bruit and no JVD. Back: No CVA tenderness. Lungs: Clear to auscultation bilaterally. No Wheezing or Rhonchi. No rales. Heart: Regular rate and rhythm, no murmur, rub or gallop. Abdomen: Soft, non-tender,not distended. Bowel sounds normal. No masses Extremities: b/l edema legs, eryhtema Some scabs      Skin: No rashes or lesions. Or bruising Lymph: Cervical, supraclavicular normal. Neurologic: Grossly non-focal Pertinent Labs Lab Results CBC    Component Value Date/Time   WBC 4.2 02/03/2019 0411   RBC 3.24 (L) 02/03/2019 0411   HGB 9.0 (L) 02/03/2019 0411   HGB 13.6 08/01/2013 2312   HCT 28.8 (L) 02/03/2019 0411   HCT 40.2  08/01/2013 2312   PLT 184 02/03/2019 0411   PLT 274 08/01/2013 2312   MCV 88.9 02/03/2019 0411   MCV 87 08/01/2013 2312   MCH 27.8 02/03/2019 0411   MCHC 31.3 02/03/2019 0411   RDW 14.0 02/03/2019 0411   RDW 14.2 08/01/2013 2312   LYMPHSABS 0.4 (L) 02/02/2019 1345   LYMPHSABS 1.5 07/14/2013 1444   MONOABS 0.5 02/02/2019 1345   MONOABS 0.7 07/14/2013 1444   EOSABS 0.3 02/02/2019 1345   EOSABS 0.2 07/14/2013 1444   BASOSABS 0.1 02/02/2019 1345   BASOSABS 0.1 07/14/2013 1444    CMP Latest Ref Rng & Units 02/03/2019 02/02/2019 01/19/2019  Glucose 70 - 99 mg/dL 87 101(H) 127(H)  BUN 8 - 23 mg/dL 15 17 11   Creatinine 0.44 - 1.00 mg/dL 1.14(H) 1.35(H) 1.15(H)  Sodium 135 - 145 mmol/L 139 137 136  Potassium 3.5 - 5.1 mmol/L 4.0 4.3 3.6  Chloride 98 - 111 mmol/L 107 103 100  CO2 22 - 32 mmol/L 25 25 24   Calcium 8.9 - 10.3 mg/dL 8.8(L) 9.1 9.1  Total Protein 6.5 - 8.1 g/dL - 7.2 6.3(L)  Total Bilirubin 0.3 - 1.2 mg/dL - 0.6 0.6  Alkaline Phos 38 - 126 U/L - 66 57  AST 15 - 41 U/L - 24 28  ALT 0 - 44 U/L - 18 26      Microbiology: Recent Results (from the past 240 hour(s))  Urine Culture     Status: Abnormal   Collection Time: 02/02/19  1:45 PM   Specimen: Urine, Random  Result Value Ref Range Status   Specimen Description   Final    URINE, RANDOM Performed at [facility], [address] [place], [place] [address]    Special Requests   Final    NONE Performed at [facility] [address] [place], [place] [address]    Culture >=100,000 COLONIES/mL ENTEROBACTER SPECIES (A)  Final   Report Status 02/04/2019 FINAL  Final   Organism ID, Bacteria ENTEROBACTER SPECIES (A)  Final      Susceptibility   Enterobacter species - MIC*    CEFAZOLIN >=64 RESISTANT Resistant     CEFTRIAXONE >=64 RESISTANT Resistant     CIPROFLOXACIN <=0.25 SENSITIVE Sensitive     GENTAMICIN <=1 SENSITIVE Sensitive     IMIPENEM 1 SENSITIVE Sensitive     NITROFURANTOIN 64  INTERMEDIATE Intermediate     TRIMETH/SULFA <=20 SENSITIVE Sensitive     PIP/TAZO >=128 RESISTANT Resistant     * >=100,000 COLONIES/mL ENTEROBACTER SPECIES  SARS CORONAVIRUS 2 (TAT 6-24 HRS) Nasopharyngeal Nasopharyngeal Swab     Status: None   Collection Time: 02/02/19  2:18 PM   Specimen: Nasopharyngeal Swab  Result Value Ref Range Status   SARS Coronavirus 2 NEGATIVE NEGATIVE Final  Comment: (NOTE) SARS-CoV-2 target nucleic acids are NOT DETECTED. The SARS-CoV-2 RNA is generally detectable in upper and lower respiratory specimens during the acute phase of infection. Negative results do not preclude SARS-CoV-2 infection, do not rule out co-infections with other pathogens, and should not be used as the sole basis for treatment or other patient management decisions. Negative results must be combined with clinical observations, patient history, and epidemiological information. The expected result is Negative. Fact Sheet for Patients: SugarRoll.be Fact Sheet for Healthcare Providers: https://www.woods-mathews.com/ This test is not yet approved or cleared by the [place] FDA and  has been authorized for detection and/or diagnosis of SARS-CoV-2 by FDA under an Emergency Use Authorization (EUA). This EUA will remain  in effect (meaning this test can be used) for the duration of the COVID-19 declaration under Section 56 4(b)(1) of the Act, 21 U.S.C. section 360bbb-3(b)(1), unless the authorization is terminated or revoked sooner. Performed at [facility], [place] [address], [place], [place] [address]     IMAGING RESULTS: CXR N I have personally reviewed the films ? Impression/Recommendation ? ? Cellulitis legs- left > rt- currently on cefazolin + vanco= Dc vanco- cefazolin can be changed to keflex for 5 more days Keep leg elevated compression stockings  Fall- due to deconditioning and leg edema  Neurogenic bladder with  chronic foley Colonized with bacteria- will not treat the enterobacter-change foley as per schedule Encouraged her to increase water intake ? _MS- on Aubagio  Depression on quetiapine and duloxetine   ID will sign off- call if needed __________________________________________________ Discussed with patient, and her husbandNote:  This document was prepared using Dragon voice recognition software and may include unintentional dictation errors.

## 2019-02-05 LAB — CREATININE, SERUM
Creatinine, Ser: 1.03 mg/dL — ABNORMAL HIGH (ref 0.44–1.00)
GFR calc Af Amer: >60 mL/min (ref >60)
GFR calc non Af Amer: 56 mL/min — ABNORMAL LOW (ref >60)

## 2019-02-05 MED ORDER — SODIUM CHLORIDE 0.9 % IV SOLN
INTRAVENOUS | Status: DC | PRN
  Administered 2019-02-05 – 2019-02-07 (×4): 250 mL via INTRAVENOUS

## 2019-02-05 MED ORDER — IPRATROPIUM-ALBUTEROL 0.5-2.5 (3) MG/3ML IN SOLN: 3.0000 mL | Freq: Four times a day (QID) | RESPIRATORY_TRACT | Status: DC | PRN

## 2019-02-05 NOTE — TOC Progression Note (Addendum)
Transition of Care [facility]) - Progression Note    Patient Details  Name: [name] MRN: [id number] Date of Birth: 31-Mar-1951  Transition of Care [facility]) CM/SW [place], LCSW Phone Number: 02/05/2019, 11:48 AM  Clinical Narrative: Received call from John Giovanni from [facility]. Provided requested information for her PASARR review.    11:58 am: CSW provided update to patient. She is frustrated about having to stay longer but understands reasons and is willing to wait it out. Patient would like to get up in chair. Sent message to RN to notify. PT is also aware.  3:59 pm: Insurance authorization is still pending.  Expected Discharge Plan: [place] Barriers to Discharge: Continued Medical Work up  Expected Discharge Plan and Services Expected Discharge Plan: [place] Choice: [place] arrangements for the past 2 months: Single Family Home                                       Social Determinants of Health (SDOH) Interventions    Readmission Risk Interventions Readmission Risk Prevention Plan 02/04/2019 01/07/2019  Transportation Screening - Complete  PCP or Specialist Appt within 3-5 Days - Complete  HRI or [place] Complete Complete  Social Work Consult for [place] Planning/Counseling Complete Complete  Palliative Care Screening Not Applicable Not Applicable  Medication Review Press photographer) - Complete  Some recent data might be hidden

## 2019-02-05 NOTE — Care Management Important Message (Signed)
Important Message  Patient Details  Name: [name] MRN: [id number] Date of Birth: 1951/03/10   Medicare Important Message Given:  Yes     Dannette Barbara 02/05/2019, 11:39 AM

## 2019-02-05 NOTE — Progress Notes (Signed)
Physical Therapy Treatment Patient Details Name: [name] MRN: [id number] DOB: Mar 05, 1951 Today's Date: 02/05/2019    History of Present Illness Pt is 68 y.o. female with a known history of multiple sclerosis, hypertension, COPD, chronic dyspnea, chronic Foley catheter presents to the emergency room due to weakness and redness of her legs. Patient found to have UTI and cellulitis of bilateral lower extremities.    PT Comments    The patient demonstrated some progress towards goals this session. Supine to sit with modA for trunk elevation, pt with improved ability weight shift towards EOB. Able to sit EOB for several minutes with fair balance. Sit <> Stand with RW and modA, pt relies heavily on UE to complete transfer and needs modAx2 in standing to maintain balance. The patient was able to ambulate ~48ft to the chair, very unsteady, some knee buckling noted, modAx2 with RW throughout.  The patient would benefit from further skilled PT intervention to maximize mobility, safety, and independence, STR recommendation remains appropriate due to current level of assistance needed for functional mobility.  Follow Up Recommendations  SNF     Equipment Recommendations  Other (comment)(TBD at next venue of care)    Recommendations for Other Services       Precautions / Restrictions Precautions Precautions: Fall Precaution Comments: LE cellulitis, hx of MS, L foot drop Restrictions Weight Bearing Restrictions: No    Mobility  Bed Mobility Overal bed mobility: Needs Assistance Bed Mobility: Supine to Sit     Supine to sit: Mod assist;HOB elevated        Transfers Overall transfer level: Needs assistance Equipment used: Rolling walker (2 wheeled) Transfers: Sit to/from Stand Sit to Stand: Mod assist         General transfer comment: Pt needs significant time/momentum to complete transfer. Pulls very heavily on RW  Ambulation/Gait Ambulation/Gait assistance: Mod assist Gait  Distance (Feet): 1 Feet Assistive device: Rolling walker (2 wheeled)       General Gait Details: Pt very unsteady, some knee buckling noted, modAx2 with RW throughout.   Stairs             Wheelchair Mobility    Modified Rankin (Stroke Patients Only)       Balance Overall balance assessment: Needs assistance Sitting-balance support: Feet supported Sitting balance-Leahy Scale: Fair                                      Cognition Arousal/Alertness: Awake/alert Behavior During Therapy: WFL for tasks assessed/performed Overall Cognitive Status: Within Functional Limits for tasks assessed                                 General Comments: Pt in better spirits this PM      Exercises Other Exercises Other Exercises: SLR x5 bilaterally, seated LAQ x10 bilaterally    General Comments        Pertinent Vitals/Pain Pain Assessment: No/denies pain    Home Living                      Prior Function            PT Goals (current goals can now be found in the care plan section) Progress towards PT goals: Progressing toward goals    Frequency    Min 2X/week  PT Plan Current plan remains appropriate    Co-evaluation              AM-PAC PT "6 Clicks" Mobility   Outcome Measure  Help needed turning from your back to your side while in a flat bed without using bedrails?: A Lot Help needed moving from lying on your back to sitting on the side of a flat bed without using bedrails?: Total Help needed moving to and from a bed to a chair (including a wheelchair)?: A Lot Help needed standing up from a chair using your arms (e.g., wheelchair or bedside chair)?: A Lot Help needed to walk in hospital room?: Total Help needed climbing 3-5 steps with a railing? : Total 6 Click Score: 9    End of Session Equipment Utilized During Treatment: Gait belt Activity Tolerance: Patient limited by fatigue Patient left: in  chair;with call bell/phone within reach;with chair alarm set Nurse Communication: Mobility status PT Visit Diagnosis: Unsteadiness on feet (R26.81);Other abnormalities of gait and mobility (R26.89);Difficulty in walking, not elsewhere classified (R26.2)     Time: [id number] PT Time Calculation (min) (ACUTE ONLY): 18 min  Charges:  $Therapeutic Exercise: 8-22 mins                    Lieutenant Diego PT, DPT 1:21 PM,02/05/19 [phone]

## 2019-02-05 NOTE — Progress Notes (Signed)
[place] at [place] NAME: [name]    MR#:  [id number]  DATE OF BIRTH:  Sep 12, 1950  SUBJECTIVE:  CHIEF COMPLAINT: Patient is feeling better , trying to ambulate with the help of physical therapy  patient has chronic multiple sclerosis, chronic indwelling Foley catheter which is changed during this admission  REVIEW OF SYSTEMS:  CONSTITUTIONAL: No fever, fatigue or weakness.  EYES: No blurred or double vision.  EARS, NOSE, AND THROAT: No tinnitus or ear pain.  RESPIRATORY: No cough, shortness of breath, wheezing or hemoptysis.  CARDIOVASCULAR: No chest pain, orthopnea, edema.  GASTROINTESTINAL: No nausea, vomiting, diarrhea or abdominal pain.  GENITOURINARY: No dysuria, hematuria.  ENDOCRINE: No polyuria, nocturia,  HEMATOLOGY: No anemia, easy bruising or bleeding SKIN: LOWER EXTREMITY REDNESS AND SWELLING MUSCULOSKELETAL: No joint pain or arthritis.   NEUROLOGIC: No tingling, numbness, weakness.  PSYCHIATRY: No anxiety or depression.   DRUG ALLERGIES:  No Known Allergies  VITALS:  Blood pressure 122/65, pulse 79, temperature 99.6 F (37.6 C), temperature source Oral, resp. rate 18, height 5\' 4"  (1.626 m), weight 136.1 kg, SpO2 98 %.  PHYSICAL EXAMINATION:  GENERAL:  68 y.o.-year-old patient lying in the bed with no acute distress.  EYES: Pupils equal, round, reactive to light and accommodation. No scleral icterus. Extraocular muscles intact.  HEENT: Head atraumatic, normocephalic. Oropharynx and nasopharynx clear.  NECK:  Supple, no jugular venous distention. No thyroid enlargement, no tenderness.  LUNGS: Normal breath sounds bilaterally, no wheezing, rales,rhonchi or crepitation. No use of accessory muscles of respiration.  CARDIOVASCULAR: S1, S2 normal. No murmurs, rubs, or gallops.  ABDOMEN: Soft, nontender, nondistended. Bowel sounds present.  EXTREMITIES: No pedal edema, cyanosis, or clubbing.  NEUROLOGIC: Cranial  nerves II through XII are intact. Muscle strength 5/5 in all extremities. Sensation intact. Gait not checked.  PSYCHIATRIC: The patient is alert and oriented x 3.  SKIN: Bilateral lower extremities are erythematous positive desquamation no discharge positive edema 1-2+       LABORATORY PANEL:   CBC Recent Labs  Lab 02/03/19 0411  WBC 4.2  HGB 9.0*  HCT 28.8*  PLT 184   ------------------------------------------------------------------------------------------------------------------  Chemistries  Recent Labs  Lab 02/02/19 1345 02/03/19 0411 02/05/19 0418  NA 137 139  --   K 4.3 4.0  --   CL 103 107  --   CO2 25 25  --   GLUCOSE 101* 87  --   BUN 17 15  --   CREATININE 1.35* 1.14* 1.03*  CALCIUM 9.1 8.8*  --   AST 24  --   --   ALT 18  --   --   ALKPHOS 66  --   --   BILITOT 0.6  --   --    ------------------------------------------------------------------------------------------------------------------  Cardiac Enzymes No results for input(s): TROPONINI in the last 168 hours. ------------------------------------------------------------------------------------------------------------------  RADIOLOGY:  No results found.  EKG:   Orders placed or performed during the hospital encounter of 02/02/19  . ED EKG  . ED EKG    ASSESSMENT AND PLAN:   Bilateral lower extremity cellulitis Patient is started on IV Ancef Treated patient with Ancef  , keep legs elevated  will get wound culture and sensitivity  Ace wrap Discharge patient with Keflex for total 5 days  *Acute cystitis with Enterobacter with neurogenic bladder  No suprapubic pain.  Does have chronic Foley and likely colonized.  Foley catheter changed.   Sensitivity to Cipro and Bactrim. ID consult placed  to Dr. Levester Fresh recommending to discontinue Ancef, vancomycin, not recommending Cipro or Bactrim for acute cystitis it is most likely colonized bacteria  *Chronic multiple sclerosis Patient is  bedbound Reposition patient every 1-2 hours  *Possible depression Psychiatry consult placed to Dr. Claris Gower; Wellbutrin dose increased Denies any suicidal or homicidal ideation  *Hypertension.  Continue home medications.   *Generalized weakness likely due to acute infection.  Physical therapy is recommending skilled nursing facility.  Patient is agreeable Education officer, museum is following   DVT prophylaxis with Lovenox   Disposition skilled nursing facility [facility] pending, follow-up with social worker  All the records are reviewed and case discussed with Care Management/Social Workerr. Management plans discussed with the patient, family and they are in agreement.  CODE STATUS: fc   TOTAL TIME TAKING CARE OF THIS PATIENT:  35 minutes.   POSSIBLE D/C IN  1-2 DAYS, DEPENDING ON CLINICAL CONDITION.  Note: This dictation was prepared with Dragon dictation along with smaller phrase technology. Any transcriptional errors that result from this process are unintentional.   Nicholes Mango M.D on 02/05/2019 at 4:36 PM  Between 7am to 6pm - Pager - [phone] After 6pm go to [email or web address] - password [email or web address] [facility]  [place] [facility]  Office  [phone]  CC: Primary care physician; Kirk Ruths, MD

## 2019-02-06 ENCOUNTER — Other Ambulatory Visit: Payer: Self-pay | Admitting: Family Medicine

## 2019-02-06 MED ORDER — CEPHALEXIN 500 MG PO CAPS
500.0000 mg | ORAL_CAPSULE | Freq: Four times a day (QID) | ORAL | Status: DC
  Administered 2019-02-06 – 2019-02-07 (×3): 500 mg via ORAL
  Filled 2019-02-06 (×3): qty 1

## 2019-02-06 MED ORDER — ALUM & MAG HYDROXIDE-SIMETH 200-200-20 MG/5ML PO SUSP
30.0000 mL | Freq: Four times a day (QID) | ORAL | Status: DC | PRN
  Administered 2019-02-06: 30 mL via ORAL
  Filled 2019-02-06: qty 30

## 2019-02-06 NOTE — TOC Progression Note (Signed)
Transition of Care [facility]) - Progression Note    Patient Details  Name: [name] MRN: [id number] Date of Birth: [birth year]  [facility]) CM/SW Contact  Marshell Garfinkel, RN Phone Number: 02/06/2019, 3:49 PM  Clinical Narrative:    Level II PASRR still pending from 02/03/19   Expected Discharge Plan: [place] Barriers to Discharge: Continued Medical Work up  Expected Discharge Plan and Services Expected Discharge Plan: [place] Choice: [place] arrangements for the past 2 months: Single Family Home                                       Social Determinants of Health (SDOH) Interventions    Readmission Risk Interventions Readmission Risk Prevention Plan 02/04/2019 01/07/2019  Transportation Screening - Complete  PCP or Specialist Appt within 3-5 Days - Complete  HRI or [place] Complete Complete  Social Work Consult for [place] Planning/Counseling Complete Complete  Palliative Care Screening Not Applicable Not Applicable  Medication Review [employer] photographer) - Complete  Some recent data might be hidden

## 2019-02-06 NOTE — Progress Notes (Signed)
[place] at [place] NAME: [name]    MR#:  [id number]  DATE OF BIRTH:  1950/06/15  SUBJECTIVE:  No complaints, waiting for insurance authorization for nursing home placement REVIEW OF SYSTEMS:  CONSTITUTIONAL: No fever, fatigue or weakness.  EYES: No blurred or double vision.  EARS, NOSE, AND THROAT: No tinnitus or ear pain.  RESPIRATORY: No cough, shortness of breath, wheezing or hemoptysis.  CARDIOVASCULAR: No chest pain, orthopnea, edema.  GASTROINTESTINAL: No nausea, vomiting, diarrhea or abdominal pain.  GENITOURINARY: No dysuria, hematuria.  ENDOCRINE: No polyuria, nocturia,  HEMATOLOGY: No anemia, easy bruising or bleeding SKIN: Bilateral lower extremity redness and swelling MUSCULOSKELETAL: No joint pain or arthritis.   NEUROLOGIC: No tingling, numbness, weakness.  PSYCHIATRY: No anxiety or depression.   DRUG ALLERGIES:  No Known Allergies  VITALS:  Blood pressure (!) 146/64, pulse 82, temperature 98.7 F (37.1 C), temperature source Oral, resp. rate 16, height 5\' 4"  (1.626 m), weight 136.1 kg, SpO2 97 %.  PHYSICAL EXAMINATION:  GENERAL:  68 y.o.-year-old patient lying in the bed with no acute distress.  EYES: Pupils equal, round, reactive to light and accommodation. No scleral icterus. Extraocular muscles intact.  HEENT: Head atraumatic, normocephalic. Oropharynx and nasopharynx clear.  NECK:  Supple, no jugular venous distention. No thyroid enlargement, no tenderness.  LUNGS: Normal breath sounds bilaterally, no wheezing, rales,rhonchi or crepitation. No use of accessory muscles of respiration.  CARDIOVASCULAR: S1, S2 normal. No murmurs, rubs, or gallops.  ABDOMEN: Soft, nontender, nondistended. Bowel sounds present.  EXTREMITIES: No pedal edema, cyanosis, or clubbing.  NEUROLOGIC: Cranial nerves II through XII are intact. Muscle strength 5/5 in all extremities. Sensation intact. Gait not checked.  PSYCHIATRIC:  The patient is alert and oriented x 3.  SKIN: Bilateral lower extremities are erythematous positive desquamation no discharge positive edema 1-2+       LABORATORY PANEL:   CBC Recent Labs  Lab 02/03/19 0411  WBC 4.2  HGB 9.0*  HCT 28.8*  PLT 184   ------------------------------------------------------------------------------------------------------------------  Chemistries  Recent Labs  Lab 02/02/19 1345 02/03/19 0411 02/05/19 0418  NA 137 139  --   K 4.3 4.0  --   CL 103 107  --   CO2 25 25  --   GLUCOSE 101* 87  --   BUN 17 15  --   CREATININE 1.35* 1.14* 1.03*  CALCIUM 9.1 8.8*  --   AST 24  --   --   ALT 18  --   --   ALKPHOS 66  --   --   BILITOT 0.6  --   --    ------------------------------------------------------------------------------------------------------------------  Cardiac Enzymes No results for input(s): TROPONINI in the last 168 hours. ------------------------------------------------------------------------------------------------------------------  RADIOLOGY:  No results found.  EKG:   Orders placed or performed during the hospital encounter of 02/02/19  . ED EKG  . ED EKG    ASSESSMENT AND PLAN:   Bilateral lower extremity cellulitis -Continue IV Ancef - keep legs elevated  -Await wound culture and sensitivity  Ace wrap -Can stop antibiotics at discharge.  He is afebrile/no leukocytosis  *Acute cystitis with Enterobacter with neurogenic bladder  No suprapubic pain.  Does have chronic Foley and likely colonized.  Foley catheter changed.   Sensitivity to Cipro and Bactrim. ID consult placed to Dr. Levester Fresh recommending to discontinue Ancef, vancomycin, not recommending Cipro or Bactrim for acute cystitis it is most likely colonized bacteria  *Chronic multiple sclerosis Patient  is bedbound Reposition patient every 1-2 hours.  She would like to sit if possible  * Depression -Appreciate psych input. Wellbutrin dose  increased Denies any suicidal or homicidal ideation  *Hypertension.  Continue home medications.  *Generalized weakness- Physical therapy is recommending skilled nursing facility.  Patient is agreeable Education officer, museum is following   DVT prophylaxis with Lovenox  Waiting for insurance authorization for placement  All the records are reviewed and case discussed with Care Management/Social Workerr. Management plans discussed with the patient, nursing and they are in agreement.  CODE STATUS: fc   TOTAL TIME TAKING CARE OF THIS PATIENT:  35 minutes.   POSSIBLE D/C IN  1-2 DAYS, DEPENDING ON CLINICAL CONDITION.  Note: This dictation was prepared with Dragon dictation along with smaller phrase technology. Any transcriptional errors that result from this process are unintentional.   Max Sane M.D on 02/06/2019 at 11:16 AM  Between 7am to 6pm - Pager - [phone] After 6pm go to [email or web address] - password [email or web address] [facility]  [place] [facility]  Office  [phone]  CC: Primary care physician; Kirk Ruths, MD

## 2019-02-07 LAB — BASIC METABOLIC PANEL
Anion gap: 7 (ref 5–15)
BUN: 15 mg/dL (ref 8–23)
CO2: 26 mmol/L (ref 22–32)
Calcium: 8.9 mg/dL (ref 8.9–10.3)
Chloride: 105 mmol/L (ref 98–111)
Creatinine, Ser: 1.07 mg/dL — ABNORMAL HIGH (ref 0.44–1.00)
GFR calc Af Amer: >60 mL/min (ref >60)
GFR calc non Af Amer: 53 mL/min — ABNORMAL LOW (ref >60)
Glucose, Bld: 84 mg/dL (ref 70–99)
Potassium: 4 mmol/L (ref 3.5–5.1)
Sodium: 138 mmol/L (ref 135–145)

## 2019-02-07 LAB — CBC
HCT: 31.3 % — ABNORMAL LOW (ref 36.0–46.0)
Hemoglobin: 9.7 g/dL — ABNORMAL LOW (ref 12.0–15.0)
MCH: 27.6 pg (ref 26.0–34.0)
MCHC: 31 g/dL (ref 30.0–36.0)
MCV: 89.2 fL (ref 80.0–100.0)
Platelets: 239 10*3/uL (ref 150–400)
RBC: 3.51 MIL/uL — ABNORMAL LOW (ref 3.87–5.11)
RDW: 14 % (ref 11.5–15.5)
WBC: 5.6 10*3/uL (ref 4.0–10.5)
nRBC: 0 % (ref 0.0–0.2)

## 2019-02-07 MED ORDER — SODIUM CHLORIDE 0.9 % IV SOLN
2.0000 g | Freq: Three times a day (TID) | INTRAVENOUS | Status: DC
  Administered 2019-02-07 – 2019-02-09 (×5): 2 g via INTRAVENOUS
  Filled 2019-02-07 (×10): qty 2

## 2019-02-07 NOTE — Progress Notes (Addendum)
[place] at [place] NAME: [name]    MR#:  [id number]  DATE OF BIRTH:  12-Aug-1950  SUBJECTIVE:  No new issues. Waiting for insurance auth REVIEW OF SYSTEMS:  CONSTITUTIONAL: No fever, fatigue or weakness.  EYES: No blurred or double vision.  EARS, NOSE, AND THROAT: No tinnitus or ear pain.  RESPIRATORY: No cough, shortness of breath, wheezing or hemoptysis.  CARDIOVASCULAR: No chest pain, orthopnea, edema.  GASTROINTESTINAL: No nausea, vomiting, diarrhea or abdominal pain.  GENITOURINARY: No dysuria, hematuria.  ENDOCRINE: No polyuria, nocturia,  HEMATOLOGY: No anemia, easy bruising or bleeding SKIN: Bilateral lower extremity redness and swelling MUSCULOSKELETAL: No joint pain or arthritis.   NEUROLOGIC: No tingling, numbness, weakness.  PSYCHIATRY: No anxiety or depression.   DRUG ALLERGIES:  No Known Allergies  VITALS:  Blood pressure (!) 111/57, pulse 72, temperature 98.3 F (36.8 C), resp. rate 16, height 5\' 4"  (1.626 m), weight 136.1 kg, SpO2 94 %. PHYSICAL EXAMINATION:  GENERAL:  68 y.o.-year-old patient lying in the bed with no acute distress.  EYES: Pupils equal, round, reactive to light and accommodation. No scleral icterus. Extraocular muscles intact.  HEENT: Head atraumatic, normocephalic. Oropharynx and nasopharynx clear.  NECK:  Supple, no jugular venous distention. No thyroid enlargement, no tenderness.  LUNGS: Normal breath sounds bilaterally, no wheezing, rales,rhonchi or crepitation. No use of accessory muscles of respiration.  CARDIOVASCULAR: S1, S2 normal. No murmurs, rubs, or gallops.  ABDOMEN: Soft, nontender, nondistended. Bowel sounds present.  EXTREMITIES: No pedal edema, cyanosis, or clubbing.  NEUROLOGIC: Cranial nerves II through XII are intact. Muscle strength 5/5 in all extremities. Sensation intact. Gait not checked.  PSYCHIATRIC: The patient is alert and oriented x 3.  SKIN: Bilateral lower  extremities are erythematous positive desquamation no discharge positive edema 1-2+       LABORATORY PANEL:   CBC Recent Labs  Lab 02/07/19 0319  WBC 5.6  HGB 9.7*  HCT 31.3*  PLT 239   ------------------------------------------------------------------------------------------------------------------  Chemistries  Recent Labs  Lab 02/02/19 1345  02/07/19 0319  NA 137   < > 138  K 4.3   < > 4.0  CL 103   < > 105  CO2 25   < > 26  GLUCOSE 101*   < > 84  BUN 17   < > 15  CREATININE 1.35*   < > 1.07*  CALCIUM 9.1   < > 8.9  AST 24  --   --   ALT 18  --   --   ALKPHOS 66  --   --   BILITOT 0.6  --   --    < > = values in this interval not displayed.   ------------------------------------------------------------------------------------------------------------------  Cardiac Enzymes No results for input(s): TROPONINI in the last 168 hours. ------------------------------------------------------------------------------------------------------------------  RADIOLOGY:  No results found.  EKG:   Orders placed or performed during the hospital encounter of 02/02/19  . ED EKG  . ED EKG    ASSESSMENT AND PLAN:   Bilateral lower extremity cellulitis -on PO Keflex - keep legs elevated  -Await wound culture and sensitivity  Ace wrap -Stop antibiotics at discharge.  He is afebrile/no leukocytosis  *Acute cystitis with Enterobacter with neurogenic bladder  No suprapubic pain.  Does have chronic Foley and likely colonized.  Foley catheter changed.   - on PO Keflex  *Chronic multiple sclerosis Patient is bedbound Reposition patient every 1-2 hours.  She would like to sit if possible  *  Depression -Appreciate psych input. Wellbutrin dose increased Denies any suicidal or homicidal ideation  *Hypertension.  Continue home medications.  *Generalized weakness- Physical therapy is recommending skilled nursing facility.  Patient is agreeable Education officer, museum is  following - I am not aware of patient having any spinal cord stimulator/feeding tube   DVT prophylaxis with Lovenox  Waiting for insurance authorization for placement  All the records are reviewed and case discussed with Care Management/Social Workerr. Management plans discussed with the patient, nursing and they are in agreement.  CODE STATUS: fc   TOTAL TIME TAKING CARE OF THIS PATIENT:  35 minutes.   POSSIBLE D/C IN  1-2 DAYS, DEPENDING ON CLINICAL CONDITION.  Note: This dictation was prepared with Dragon dictation along with smaller phrase technology. Any transcriptional errors that result from this process are unintentional.   Max Sane M.D on 02/07/2019 at 9:19 AM  Between 7am to 6pm - Pager - [phone] After 6pm go to [email or web address] - password [email or web address] [facility]  [place] [facility]  Office  [phone]  CC: Primary care physician; Kirk Ruths, MD

## 2019-02-07 NOTE — Consult Note (Signed)
Pharmacy Antibiotic Note  [name] is a 68 y.o. female admitted on 02/02/2019 with Wound infection.  Pharmacy has been consulted for cefepime dosing.  Plan: Cefepime 2g q8H. 10/8 wound culture growing ABUNDANT SERRATIA MARCESCENS, MODERATE PSEUDOMONAS AERUGINOSA. Both organism sensitive to cefepime. Pt was on 5 day of IV ancef and was switched to PO keflex on 10/10.   Height: 5\' 4"  (162.6 cm) Weight: 300 lb (136.1 kg) IBW/kg (Calculated) : 54.7  Temp (24hrs), Avg:99.1 F (37.3 C), Min:98.3 F (36.8 C), Max:100.1 F (37.8 C)  Recent Labs  Lab 02/02/19 1345 02/03/19 0411 02/05/19 0418 02/07/19 0319  WBC 5.3 4.2  --  5.6  CREATININE 1.35* 1.14* 1.03* 1.07*    Estimated Creatinine Clearance: 69.4 mL/min (A) (by C-G formula based on SCr of 1.07 mg/dL (H)).    No Known Allergies  Antimicrobials this admission: 10/11 cefepime >>   Dose adjustments this admission: None  Microbiology results: 10/8 wound culture growing ABUNDANT SERRATIA MARCESCENS, MODERATE PSEUDOMONAS AERUGINOSA. 10/6 Urine culture enterobacter - per ID not treating urine culture.    Thank you for allowing pharmacy to be a part of this patient's care.  Oswald Hillock, PharmD, BCPS 02/07/2019 11:12 AM

## 2019-02-08 LAB — CBC
HCT: 32.1 % — ABNORMAL LOW (ref 36.0–46.0)
Hemoglobin: 10.1 g/dL — ABNORMAL LOW (ref 12.0–15.0)
MCH: 28.1 pg (ref 26.0–34.0)
MCHC: 31.5 g/dL (ref 30.0–36.0)
MCV: 89.4 fL (ref 80.0–100.0)
Platelets: 213 10*3/uL (ref 150–400)
RBC: 3.59 MIL/uL — ABNORMAL LOW (ref 3.87–5.11)
RDW: 13.8 % (ref 11.5–15.5)
WBC: 4.7 10*3/uL (ref 4.0–10.5)
nRBC: 0 % (ref 0.0–0.2)

## 2019-02-08 LAB — BASIC METABOLIC PANEL
Anion gap: 4 — ABNORMAL LOW (ref 5–15)
BUN: 17 mg/dL (ref 8–23)
CO2: 27 mmol/L (ref 22–32)
Calcium: 9 mg/dL (ref 8.9–10.3)
Chloride: 107 mmol/L (ref 98–111)
Creatinine, Ser: 1.05 mg/dL — ABNORMAL HIGH (ref 0.44–1.00)
GFR calc Af Amer: >60 mL/min (ref >60)
GFR calc non Af Amer: 55 mL/min — ABNORMAL LOW (ref >60)
Glucose, Bld: 90 mg/dL (ref 70–99)
Potassium: 3.8 mmol/L (ref 3.5–5.1)
Sodium: 138 mmol/L (ref 135–145)

## 2019-02-08 MED ORDER — CLONAZEPAM 1 MG PO TABS: 0.5000 mg | ORAL_TABLET | Freq: Every day | ORAL | 0 refills | Status: DC

## 2019-02-08 MED ORDER — TRAMADOL HCL 50 MG PO TABS: 50.0000 mg | ORAL_TABLET | Freq: Four times a day (QID) | ORAL | 0 refills | Status: DC | PRN

## 2019-02-08 NOTE — Discharge Summary (Addendum)
[facility] [place] NAME: [name]    MR#:  [id number]  DATE OF BIRTH:  1950/06/15  DATE OF ADMISSION:  02/02/2019   ADMITTING PHYSICIAN: [name], MD  DATE OF DISCHARGE: 02/09/2019  PRIMARY CARE PHYSICIAN: Kirk Ruths, MD   ADMISSION DIAGNOSIS:  Generalized weakness [R53.1] Cellulitis, unspecified cellulitis site [L03.90] DISCHARGE DIAGNOSIS:  Active Problems:   Bilateral lower leg cellulitis   Pressure injury of skin  SECONDARY DIAGNOSIS:   Past Medical History:  Diagnosis Date  . Abdominal aortic atherosclerosis ([place]) 11/11/2016  . ADHD   . Anxiety   . COPD (chronic obstructive pulmonary disease) ([place])   . Depression    major depressive  . Dyspnea    doe  . Edema    left leg  . Follicular lymphoma ([place])    B Cell  . Follicular lymphoma grade II ([place])   . Hypertension   . Hypotension    idiopathic  . Kyphoscoliosis and scoliosis 11/26/2011  . Morbid obesity ([place]) 01/05/2016  . Multiple sclerosis ([place])   . Multiple sclerosis ([place])    1980's  . Neuromuscular disorder ([place])   . Obstructive and reflux uropathy    foley  . Pain    atypical facial  . Peripheral vascular disease of lower extremity with ulceration ([place]) 11/08/2015  . Skin ulcer ([place]) 11/08/2015  . Weakness    generalized. has MS   HOSPITAL COURSE:   Bilateral lower extremity cellulitis -finished course of Abx - keep legs elevated, consider Ace wrap -remains afebrile/no leukocytosis  *Acute cystitis with Enterobacter with neurogenic bladder No suprapubic pain. Does have chronic Foley and is colonized flora per ID. Foley catheter changed.  - no need of Abx  *Chronic multiple sclerosis Patient is bedbound Reposition patient every 1-2 hours.  She would like to sit if possible  * Depression -continue wellbutrin No suicidal or homicidal ideation  *Hypertension: resume home meds  *Generalized weakness-going STR/SNF per  physical therapy recommendation  DISCHARGE CONDITIONS:  fair CONSULTS OBTAINED:  Treatment Team:  Dixie Dials, MD DRUG ALLERGIES:  No Known Allergies DISCHARGE MEDICATIONS:   Allergies as of 02/08/2019   No Known Allergies     Medication List    TAKE these medications   acetaminophen 325 MG tablet Commonly known as: TYLENOL Take 2 tablets (650 mg total) by mouth every 6 (six) hours as needed for mild pain (or Fever >/= 101).   albuterol 108 (90 Base) MCG/ACT inhaler Commonly known as: VENTOLIN HFA Inhale 1 puff into the lungs every 4 (four) hours as needed for shortness of breath.   amLODipine 5 MG tablet Commonly known as: NORVASC Take 5 mg by mouth daily.   atorvastatin 10 MG tablet Commonly known as: LIPITOR Take 10 mg by mouth daily.   Aubagio 14 MG Tabs Generic drug: Teriflunomide Take by mouth.   buPROPion 300 MG 24 hr tablet Commonly known as: WELLBUTRIN XL Take 300 mg by mouth daily.   clonazePAM 1 MG tablet Commonly known as: KLONOPIN Take 0.5 tablets (0.5 mg total) by mouth at bedtime.   docusate sodium 100 MG capsule Commonly known as: COLACE Take 1 capsule (100 mg total) by mouth 2 (two) times daily. What changed:   when to take this  reasons to take this   DULoxetine 60 MG capsule Commonly known as: CYMBALTA Take 1 capsule (60 mg total) by mouth every morning.   gabapentin 600 MG tablet Commonly known as:  NEURONTIN Take 1 tablet (600 mg total) by mouth 3 (three) times daily.   hydrALAZINE 50 MG tablet Commonly known as: APRESOLINE Take 50 mg by mouth 3 (three) times daily.   interferon beta-1a 30 MCG/0.5ML Pskt injection Commonly known as: AVONEX Inject 30 mcg into the muscle every Monday.   lisinopril 20 MG tablet Commonly known as: ZESTRIL Take 20 mg by mouth daily.   multivitamin with minerals Tabs tablet Take 1 tablet by mouth every evening.   Myrbetriq 50 MG Tb24 tablet Generic drug: mirabegron ER TAKE ONE TABLET  BY MOUTH ONCE DAILY (FOR OVERACTIVE BLADDER)   potassium chloride 10 MEQ tablet Commonly known as: KLOR-CON Take 10 mEq by mouth daily.   QUEtiapine Fumarate 150 MG 24 hr tablet Commonly known as: SEROQUEL XR Take 150 mg by mouth at bedtime.   Symbicort 160-4.5 MCG/ACT inhaler Generic drug: budesonide-formoterol Inhale 2 puffs into the lungs 2 (two) times daily.   tiZANidine 4 MG tablet Commonly known as: ZANAFLEX Take 4 mg by mouth 3 (three) times daily.   traMADol 50 MG tablet Commonly known as: ULTRAM Take 1 tablet (50 mg total) by mouth every 6 (six) hours as needed for up to 20 doses for moderate pain.   vitamin C 500 MG tablet Commonly known as: ASCORBIC ACID Take 500 mg by mouth every evening.        DISCHARGE INSTRUCTIONS:   DIET:  Regular diet DISCHARGE CONDITION:  Fair ACTIVITY:  Activity as tolerated OXYGEN:  Home Oxygen: No.  Oxygen Delivery: room air DISCHARGE LOCATION:  nursing home   If you experience worsening of your admission symptoms, develop shortness of breath, life threatening emergency, suicidal or homicidal thoughts you must seek medical attention immediately by calling 911 or calling your MD immediately  if symptoms less severe.  You Must read complete instructions/literature along with all the possible adverse reactions/side effects for all the Medicines you take and that have been prescribed to you. Take any new Medicines after you have completely understood and accpet all the possible adverse reactions/side effects.   Please note  You were cared for by a hospitalist during your hospital stay. If you have any questions about your discharge medications or the care you received while you were in the hospital after you are discharged, you can call the unit and asked to speak with the hospitalist on call if the hospitalist that took care of you is not available. Once you are discharged, your primary care physician will handle any further  medical issues. Please note that NO REFILLS for any discharge medications will be authorized once you are discharged, as it is imperative that you return to your primary care physician (or establish a relationship with a primary care physician if you do not have one) for your aftercare needs so that they can reassess your need for medications and monitor your lab values.    On the day of Discharge:  VITAL SIGNS:  Blood pressure (!) 171/79, pulse 83, temperature 98.1 F (36.7 C), resp. rate 20, height 5\' 4"  (1.626 m), weight 136.1 kg, SpO2 98 %. PHYSICAL EXAMINATION:  GENERAL:  67 y.o.-year-old patient lying in the bed with no acute distress.  EYES: Pupils equal, round, reactive to light and accommodation. No scleral icterus. Extraocular muscles intact.  HEENT: Head atraumatic, normocephalic. Oropharynx and nasopharynx clear.  NECK:  Supple, no jugular venous distention. No thyroid enlargement, no tenderness.  LUNGS: Normal breath sounds bilaterally, no wheezing, rales,rhonchi or crepitation. No use of  accessory muscles of respiration.  CARDIOVASCULAR: S1, S2 normal. No murmurs, rubs, or gallops.  ABDOMEN: Soft, non-tender, non-distended. Bowel sounds present. No organomegaly or mass.  EXTREMITIES: No pedal edema, cyanosis, or clubbing.  NEUROLOGIC: Cranial nerves II through XII are intact. Muscle strength 5/5 in all extremities. Sensation intact. Gait not checked.  PSYCHIATRIC: The patient is alert and oriented x 3.  SKIN: No obvious rash, lesion, or ulcer.  DATA REVIEW:   CBC Recent Labs  Lab 02/08/19 0546  WBC 4.7  HGB 10.1*  HCT 32.1*  PLT 213    Chemistries  Recent Labs  Lab 02/02/19 1345  02/08/19 0546  NA 137   < > 138  K 4.3   < > 3.8  CL 103   < > 107  CO2 25   < > 27  GLUCOSE 101*   < > 90  BUN 17   < > 17  CREATININE 1.35*   < > 1.05*  CALCIUM 9.1   < > 9.0  AST 24  --   --   ALT 18  --   --   ALKPHOS 66  --   --   BILITOT 0.6  --   --    < > = values in  this interval not displayed.     Microbiology Results  Results for orders placed or performed during the hospital encounter of 02/02/19  Urine Culture     Status: Abnormal   Collection Time: 02/02/19  1:45 PM   Specimen: Urine, Random  Result Value Ref Range Status   Specimen Description   Final    URINE, RANDOM Performed at [facility], [address] [place], [place] [address]    Special Requests   Final    NONE Performed at [facility], [address] [place],  [address]    Culture >=100,000 COLONIES/mL ENTEROBACTER SPECIES (A)  Final   Report Status 02/04/2019 FINAL  Final   Organism ID, Bacteria ENTEROBACTER SPECIES (A)  Final      Susceptibility   Enterobacter species - MIC*    CEFAZOLIN >=64 RESISTANT Resistant     CEFTRIAXONE >=64 RESISTANT Resistant     CIPROFLOXACIN <=0.25 SENSITIVE Sensitive     GENTAMICIN <=1 SENSITIVE Sensitive     IMIPENEM 1 SENSITIVE Sensitive     NITROFURANTOIN 64 INTERMEDIATE Intermediate     TRIMETH/SULFA <=20 SENSITIVE Sensitive     PIP/TAZO >=128 RESISTANT Resistant     * >=100,000 COLONIES/mL ENTEROBACTER SPECIES  SARS CORONAVIRUS 2 (TAT 6-24 HRS) Nasopharyngeal Nasopharyngeal Swab     Status: None   Collection Time: 02/02/19  2:18 PM   Specimen: Nasopharyngeal Swab  Result Value Ref Range Status   SARS Coronavirus 2 NEGATIVE NEGATIVE Final    Comment: (NOTE) SARS-CoV-2 target nucleic acids are NOT DETECTED. The SARS-CoV-2 RNA is generally detectable in upper and lower respiratory specimens during the acute phase of infection. Negative results do not preclude SARS-CoV-2 infection, do not rule out co-infections with other pathogens, and should not be used as the sole basis for treatment or other patient management decisions. Negative results must be combined with clinical observations, patient history, and epidemiological information. The expected result is Negative. Fact Sheet for  Patients: SugarRoll.be Fact Sheet for Healthcare Providers: https://www.woods-mathews.com/ This test is not yet approved or cleared by the [place] FDA and  has been authorized for detection and/or diagnosis of SARS-CoV-2 by FDA under an Emergency Use Authorization (EUA). This EUA will remain  in effect (meaning this test can be used) for the duration of the COVID-19 declaration under Section 56 4(b)(1) of the Act, 21 U.S.C. section 360bbb-3(b)(1), unless the authorization is terminated or revoked sooner. Performed at [facility], [place] [address] [place], [place] [address]   Aerobic/Anaerobic Culture (surgical/deep wound)     Status: None (Preliminary result)   Collection Time: 02/04/19  3:40 PM   Specimen: Wound  Result Value Ref Range Status   Specimen Description   Final    WOUND Performed at [facility], [address], [place], [place] [address]    Special Requests   Final    RIGHT LEG Performed at [facility], [address] [place], [place] [address]    Gram Stain   Final    NO WBC SEEN RARE GRAM POSITIVE COCCI IN PAIRS RARE GRAM NEGATIVE RODS Performed at [facility], [place] [address], [place], [place] [address]    Culture   Final    ABUNDANT SERRATIA MARCESCENS MODERATE PSEUDOMONAS AERUGINOSA NO ANAEROBES ISOLATED; CULTURE IN PROGRESS FOR 5 DAYS    Report Status PENDING  Incomplete   Organism ID, Bacteria SERRATIA MARCESCENS  Final   Organism ID, Bacteria PSEUDOMONAS AERUGINOSA  Final      Susceptibility   Pseudomonas aeruginosa - MIC*    CEFTAZIDIME 2 SENSITIVE Sensitive     CIPROFLOXACIN 2 INTERMEDIATE Intermediate     GENTAMICIN <=1 SENSITIVE Sensitive     IMIPENEM 2 SENSITIVE Sensitive     PIP/TAZO 8 SENSITIVE Sensitive     CEFEPIME 8 SENSITIVE Sensitive     * MODERATE PSEUDOMONAS AERUGINOSA   Serratia marcescens - MIC*    CEFAZOLIN >=64 RESISTANT Resistant     CEFEPIME <=1  SENSITIVE Sensitive     CEFTAZIDIME <=1 SENSITIVE Sensitive     CEFTRIAXONE <=1 SENSITIVE Sensitive     CIPROFLOXACIN <=0.25 SENSITIVE Sensitive     GENTAMICIN <=1 SENSITIVE Sensitive     TRIMETH/SULFA <=20 SENSITIVE Sensitive     * ABUNDANT SERRATIA MARCESCENS    RADIOLOGY:  No results found.   Management plans discussed with the patient, nursing and they are in agreement.  CODE STATUS: Full Code   TOTAL TIME TAKING CARE OF THIS PATIENT: 45 minutes.    Max Sane M.D on 02/08/2019 at 3:52 PM  Between 7am to 6pm - Pager - [phone]  After 6pm go to [email or web address] - [employer] [facility]  Office  [phone]  CC: Primary care physician; Kirk Ruths, MD   Note: This dictation was prepared with Dragon dictation along with smaller phrase technology. Any transcriptional errors that result from this process are unintentional.

## 2019-02-08 NOTE — Progress Notes (Signed)
Physical Therapy Treatment Patient Details Name: [name] MRN: [id number] DOB: Feb 07, 1951 Today's Date: 02/08/2019    History of Present Illness Pt is 68 y.o. female with a known history of multiple sclerosis, hypertension, COPD, chronic dyspnea, chronic Foley catheter presents to the emergency room due to weakness and redness of her legs. Patient found to have UTI and cellulitis of bilateral lower extremities.    PT Comments    Pt agreeable to PT; reports abdomen feels "yucky"; denies pain or N/V. States nursing aware. Pt demonstrates decreasing assist for transfers and short ambulation although continues to present with unsteady stance and ambulation coupled with increased anxiety with activities. Pt demonstrates Min A and increased rocking/effort to initiate stance. Static stance with Min guard. Short ambulation 5 ft fwd and 5 ft backward with unsteady/shaky knees, but no overt buckling; decreased ability to attain full knee extension with stance phase during gait. Stand exercises for static stance and march in place with quick fatigue. Pt educated on seated and long sit exercises with participation. Encouraged performance several times a day to improve strength. Continue PT to progress endurance, strength and balance to improve all functional mobility. Pt states at baseline she is a short ambulator (room to room); at this time pt is unsafe to return home and at an increased risk for falls. Continue skilled nursing facility recommendation.   Follow Up Recommendations  SNF     Equipment Recommendations       Recommendations for Other Services       Precautions / Restrictions Precautions Precautions: Fall Restrictions Weight Bearing Restrictions: No    Mobility  Bed Mobility               General bed mobility comments: up in chair  Transfers Overall transfer level: Needs assistance Equipment used: Rolling walker (2 wheeled) Transfers: Sit to/from Stand Sit to Stand:  Min assist;Mod assist         General transfer comment: From recliner; increased preparation and effort to arise, but able to do so with rocking motion and Min A and steady. Increased assist to control descent  Ambulation/Gait Ambulation/Gait assistance: Min assist Gait Distance (Feet): 10 Feet(5 fwd/5 bkwd) Assistive device: Rolling walker (2 wheeled) Gait Pattern/deviations: Step-to pattern;Wide base of support;Decreased stride length     General Gait Details: Shaky/unsteady with small step to steps. Becomes increasingly anxious with attempt   Stairs             Wheelchair Mobility    Modified Rankin (Stroke Patients Only)       Balance Overall balance assessment: Needs assistance Sitting-balance support: Feet supported Sitting balance-Leahy Scale: Good     Standing balance support: Bilateral upper extremity supported Standing balance-Leahy Scale: Poor Standing balance comment: although continues poor, pt does require deceased assist this session from Mod previously to [place] currently                            Cognition Arousal/Alertness: Awake/alert Behavior During Therapy: WFL for tasks assessed/performed Overall Cognitive Status: Within Functional Limits for tasks assessed                                        Exercises General Exercises - Lower Extremity Ankle Circles/Pumps: AROM;Both;10 reps;Seated Quad Sets: Strengthening;Both;15 reps(long sit) Gluteal Sets: Strengthening;Both;15 reps(long sit) Long Arc Quad: AROM;Both;15 reps;Seated Heel Slides: AROM;Both;10  reps(long sit) Hip Flexion/Marching: AROM;Both;10 reps;Standing(2 sets; also in seated x 15 reps) Other Exercises Other Exercises: static stand    General Comments        Pertinent Vitals/Pain      Home Living                      Prior Function            PT Goals (current goals can now be found in the care plan section) Progress towards PT  goals: Progressing toward goals    Frequency    Min 2X/week      PT Plan Current plan remains appropriate    Co-evaluation              AM-PAC PT "6 Clicks" Mobility   Outcome Measure  Help needed turning from your back to your side while in a flat bed without using bedrails?: A Little Help needed moving from lying on your back to sitting on the side of a flat bed without using bedrails?: A Little Help needed moving to and from a bed to a chair (including a wheelchair)?: A Little Help needed standing up from a chair using your arms (e.g., wheelchair or bedside chair)?: A Little Help needed to walk in hospital room?: A Lot Help needed climbing 3-5 steps with a railing? : Total 6 Click Score: 15    End of Session Equipment Utilized During Treatment: Gait belt   Patient left: in chair;with call bell/phone within reach;with chair alarm set   PT Visit Diagnosis: Unsteadiness on feet (R26.81);Other abnormalities of gait and mobility (R26.89);Difficulty in walking, not elsewhere classified (R26.2)     Time: [id number] PT Time Calculation (min) (ACUTE ONLY): 23 min  Charges:  $Gait Training: 8-22 mins $Therapeutic Exercise: 8-22 mins                      Larae Grooms, PTA 02/08/2019, 3:16 PM

## 2019-02-08 NOTE — Progress Notes (Signed)
[place] [facility] [place] NAME: [name]    MR#:  [id number]  DATE OF BIRTH:  07/03/1950  SUBJECTIVE:  No new issues. Still waiting for insurance auth and placement REVIEW OF SYSTEMS:  CONSTITUTIONAL: No fever, fatigue or weakness.  EYES: No blurred or double vision.  EARS, NOSE, AND THROAT: No tinnitus or ear pain.  RESPIRATORY: No cough, shortness of breath, wheezing or hemoptysis.  CARDIOVASCULAR: No chest pain, orthopnea, edema.  GASTROINTESTINAL: No nausea, vomiting, diarrhea or abdominal pain.  GENITOURINARY: No dysuria, hematuria.  ENDOCRINE: No polyuria, nocturia,  HEMATOLOGY: No anemia, easy bruising or bleeding SKIN: Bilateral lower extremity redness and swelling MUSCULOSKELETAL: No joint pain or arthritis.   NEUROLOGIC: No tingling, numbness, weakness.  PSYCHIATRY: No anxiety or depression.   DRUG ALLERGIES:  No Known Allergies  VITALS:  Blood pressure (!) 171/79, pulse 83, temperature 98.1 F (36.7 C), resp. rate 20, height 5\' 4"  (1.626 m), weight 136.1 kg, SpO2 98 %. PHYSICAL EXAMINATION:  GENERAL:  68 y.o.-year-old patient lying in the bed with no acute distress.  EYES: Pupils equal, round, reactive to light and accommodation. No scleral icterus. Extraocular muscles intact.  HEENT: Head atraumatic, normocephalic. Oropharynx and nasopharynx clear.  NECK:  Supple, no jugular venous distention. No thyroid enlargement, no tenderness.  LUNGS: Normal breath sounds bilaterally, no wheezing, rales,rhonchi or crepitation. No use of accessory muscles of respiration.  CARDIOVASCULAR: S1, S2 normal. No murmurs, rubs, or gallops.  ABDOMEN: Soft, nontender, nondistended. Bowel sounds present.  EXTREMITIES: No pedal edema, cyanosis, or clubbing.  NEUROLOGIC: Cranial nerves II through XII are intact. Muscle strength 5/5 in all extremities. Sensation intact. Gait not checked.  PSYCHIATRIC: The patient is alert and oriented x 3.   SKIN: Bilateral lower extremities are erythematous positive desquamation no discharge positive edema 1-2+       LABORATORY PANEL:   CBC Recent Labs  Lab 02/08/19 0546  WBC 4.7  HGB 10.1*  HCT 32.1*  PLT 213   ------------------------------------------------------------------------------------------------------------------  Chemistries  Recent Labs  Lab 02/02/19 1345  02/08/19 0546  NA 137   < > 138  K 4.3   < > 3.8  CL 103   < > 107  CO2 25   < > 27  GLUCOSE 101*   < > 90  BUN 17   < > 17  CREATININE 1.35*   < > 1.05*  CALCIUM 9.1   < > 9.0  AST 24  --   --   ALT 18  --   --   ALKPHOS 66  --   --   BILITOT 0.6  --   --    < > = values in this interval not displayed.   ------------------------------------------------------------------------------------------------------------------  Cardiac Enzymes No results for input(s): TROPONINI in the last 168 hours. ------------------------------------------------------------------------------------------------------------------  RADIOLOGY:  No results found.  EKG:   Orders placed or performed during the hospital encounter of 02/02/19  . ED EKG  . ED EKG    ASSESSMENT AND PLAN:   Bilateral lower extremity cellulitis -on PO Keflex - keep legs elevated  -wound culture growing pseudomonas and serratia - but ID this is likely colonization. No need of Abx - I've requested ID (on 10/11) to follow up on her - Ace wrap -Stop antibiotics at discharge.  She is afebrile/no leukocytosis  *Acute cystitis with Enterobacter with neurogenic bladder  No suprapubic pain.  Does have chronic Foley and likely colonized.  Foley catheter changed.   -  on PO Keflex  *Chronic multiple sclerosis Patient is bedbound Reposition patient every 1-2 hours.  She would like to sit if possible  * Depression -Appreciate psych input. Wellbutrin dose increased Denies any suicidal or homicidal ideation  *Hypertension.  Continue home  medications.  *Generalized weakness- Physical therapy is recommending skilled nursing facility.  Patient is agreeable Education officer, museum is following - I am not aware of patient having any spinal cord stimulator/feeding tube   DVT prophylaxis with Lovenox  Still waiting for insurance authorization for placement  All the records are reviewed and case discussed with Care Management/Social Workerr. Management plans discussed with the patient, nursing and they are in agreement.  CODE STATUS: fc   TOTAL TIME TAKING CARE OF THIS PATIENT:  35 minutes.   POSSIBLE D/C IN  1 DAYS, DEPENDING ON CLINICAL CONDITION.  Note: This dictation was prepared with Dragon dictation along with smaller phrase technology. Any transcriptional errors that result from this process are unintentional.   Max Sane M.D on 02/08/2019 at 5:34 PM  Between 7am to 6pm - Pager - [phone] After 6pm go to [email or web address] - password [email or web address] [facility]  [place] [facility]  Office  [phone]  CC: Primary care physician; Kirk Ruths, MD

## 2019-02-08 NOTE — TOC Progression Note (Addendum)
Transition of Care [facility]) - Progression Note    Patient Details  Name: [name] MRN: [id number] Date of Birth: 1950/05/28  Transition of [facility]) CM/SW Contact  Candie Chroman, LCSW Phone Number: 02/08/2019, 8:56 AM  Clinical Narrative:  PASARR obtained: [id number] F. Expires 03/10/2019. Left message for [facility] admissions coordinator to notify. Still waiting to hear about insurance authorization.    11:59 am: Insurance authorization is still pending. Sent message to PT requesting updated note.  1:49 pm: Per SNF, [facility] is requesting updated therapy notes. PT is aware.  3:28 pm: Updated PT note sent to [employer] so they can forward it to her insurance company.  5:09 pm: Insurance authorization still pending. Likely will not get today now that it is after 5:00. Patient aware. Patient will not need a new COVID test if discharging tomorrow. RN aware. Sent message to MD to update.   Expected Discharge Plan: [place] Barriers to Discharge: Continued Medical Work up  Expected Discharge Plan and Services Expected Discharge Plan: [place] Choice: [place] arrangements for the past 2 months: Single Family Home                                       Social Determinants of Health (SDOH) Interventions    Readmission Risk Interventions Readmission Risk Prevention Plan 02/04/2019 01/07/2019  Transportation Screening - Complete  PCP or Specialist Appt within 3-5 Days - Complete  HRI or [place] Complete Complete  Social Work Consult for [place] Planning/Counseling Complete Complete  Palliative Care Screening Not Applicable Not Applicable  Medication Review Press photographer) - Complete  Some recent data might be hidden

## 2019-02-08 NOTE — Progress Notes (Signed)
Pt to go to snf  Possibly this pm. Waiting to hear back from  P.t. standpoint. Pt up in chair no distress.

## 2019-02-08 NOTE — Care Management Important Message (Signed)
Important Message  Patient Details  Name: [name] MRN: [id number] Date of Birth: 07-05-1950   Medicare Important Message Given:  Yes     Dannette Barbara 02/08/2019, 10:57 AM

## 2019-02-09 ENCOUNTER — Telehealth: Payer: Self-pay | Admitting: *Deleted

## 2019-02-09 LAB — CREATININE, SERUM
Creatinine, Ser: 1.19 mg/dL — ABNORMAL HIGH (ref 0.44–1.00)
GFR calc Af Amer: 54 mL/min — ABNORMAL LOW (ref >60)
GFR calc non Af Amer: 47 mL/min — ABNORMAL LOW (ref >60)

## 2019-02-09 MED ORDER — MYRBETRIQ 50 MG PO TB24: ORAL_TABLET | ORAL | 0 refills | Status: DC

## 2019-02-09 NOTE — Plan of Care (Signed)
  Problem: Education: Goal: Knowledge of General Education information will improve Description: Including pain rating scale, medication(s)/side effects and non-pharmacologic comfort measures Outcome: Progressing   Problem: Health Behavior/Discharge Planning: Goal: Ability to manage health-related needs will improve Outcome: Progressing   Problem: Coping: Goal: Level of anxiety will decrease Outcome: Progressing   Problem: Pain Managment: Goal: General experience of comfort will improve Outcome: Progressing   Problem: Safety: Goal: Ability to remain free from injury will improve Outcome: Progressing   Problem: Skin Integrity: Goal: Risk for impaired skin integrity will decrease Outcome: Progressing   Problem: Skin Integrity: Goal: Risk for impaired skin integrity will decrease Outcome: Progressing

## 2019-02-09 NOTE — Progress Notes (Signed)
Pt for discharge today to [facility]. A/o. No distress.  Pt has chronic foley.   No distress. lge bm this pm.  Report called to angela at [facility] and  Ems called for transport for pt.  Ems report several in front of pt.  Sl d/cd.

## 2019-02-09 NOTE — TOC Progression Note (Signed)
Transition of Care [facility]) - Progression Note    Patient Details  Name: [name] MRN: [id number] Date of Birth: March 09, 1951  Transition of Care [facility]) CM/SW [place], LCSW Phone Number: 02/09/2019, 12:17 PM  Clinical Narrative: Insurance authorization is approved. Sent MD and RN a secure chat message to notify. Asked MD to update discharge date on summary to day.   Expected Discharge Plan: Skilled Nursing Facility Barriers to Discharge: Continued Medical Work up  Expected Discharge Plan and Services Expected Discharge Plan: [place] Choice: [place] arrangements for the past 2 months: Single Family Home Expected Discharge Date: 02/08/19                                     Social Determinants of Health (SDOH) Interventions    Readmission Risk Interventions Readmission Risk Prevention Plan 02/04/2019 01/07/2019  Transportation Screening - Complete  PCP or Specialist Appt within 3-5 Days - Complete  HRI or [place] Complete Complete  Social Work Consult for [place] Planning/Counseling Complete Complete  Palliative Care Screening Not Applicable Not Applicable  Medication Review Press photographer) - Complete  Some recent data might be hidden

## 2019-02-09 NOTE — Discharge Instructions (Signed)

## 2019-02-09 NOTE — TOC Transition Note (Signed)
Transition of Care [facility]) - CM/SW Discharge Note   Patient Details  Name: [name] MRN: [id number] Date of Birth: 1950/12/26  Transition of Care [facility]) CM/SW Contact:  Candie Chroman, LCSW Phone Number: 02/09/2019, 2:01 PM   Clinical Narrative: Patient has insurance approval and discharge orders to go to [facility] today. RN will call report to [phone] prior to setting up EMS transport. CSW called and left patient's husband a Advertising account executive. No further concerns. CSW signing off.   Final next level of care: Skilled Nursing Facility Barriers to Discharge: Barriers Resolved   Patient Goals and CMS Choice   CMS Medicare.gov Compare Post Acute Care list provided to:: Patient Choice offered to / list presented to : Patient  Discharge Placement PASRR number recieved: 02/08/19            Patient chooses bed at: [facility] Patient to be transferred to facility by: EMS Name of family member notified: Left voicemail for spouse, Kahliya Campuzano Patient and family notified of of transfer: 02/09/19  Discharge Plan and Services     Post Acute Care Choice: [place]                               Social Determinants of Health (SDOH) Interventions     Readmission Risk Interventions Readmission Risk Prevention Plan 02/04/2019 01/07/2019  Transportation Screening - Complete  PCP or Specialist Appt within 3-5 Days - Complete  HRI or [place] Complete Complete  Social Work Consult for [place] Planning/Counseling Complete Complete  Palliative Care Screening Not Applicable Not Applicable  Medication Review Press photographer) - Complete  Some recent data might be hidden

## 2019-02-09 NOTE — Telephone Encounter (Signed)
Refill myrbetriq 50mg . Patient in hospital . She will need to make a appt for another refill. I only give her one month.

## 2019-02-10 ENCOUNTER — Encounter: Payer: Self-pay | Admitting: Oncology

## 2019-02-10 ENCOUNTER — Other Ambulatory Visit: Payer: Self-pay

## 2019-02-10 LAB — AEROBIC/ANAEROBIC CULTURE W GRAM STAIN (SURGICAL/DEEP WOUND): Gram Stain: NONE SEEN

## 2019-02-10 NOTE — Progress Notes (Unsigned)
Patient recently discharged to [facility] after hospitalization.

## 2019-02-11 ENCOUNTER — Inpatient Hospital Stay: Payer: Medicare Other

## 2019-02-11 ENCOUNTER — Inpatient Hospital Stay: Payer: Medicare Other | Admitting: Oncology

## 2019-03-01 ENCOUNTER — Telehealth: Payer: Self-pay | Admitting: Oncology

## 2019-03-01 NOTE — Telephone Encounter (Signed)
Patient cancelled appointment on 02-11-19 due to lack of transportation. Writer was asked to phone patient to reschedule this appointment and was unable to reach patient. Message left requesting call back to reschedule appointment.

## 2019-03-05 ENCOUNTER — Other Ambulatory Visit: Payer: Self-pay

## 2019-03-05 ENCOUNTER — Other Ambulatory Visit: Payer: Self-pay | Admitting: Family Medicine

## 2019-03-05 ENCOUNTER — Other Ambulatory Visit: Payer: Self-pay | Admitting: Urology

## 2019-03-05 ENCOUNTER — Encounter: Payer: Self-pay | Admitting: Oncology

## 2019-03-05 NOTE — Progress Notes (Signed)
Patient stated that she had been doing well. 

## 2019-03-07 ENCOUNTER — Other Ambulatory Visit: Payer: Self-pay | Admitting: *Deleted

## 2019-03-07 DIAGNOSIS — C8293 Follicular lymphoma, unspecified, intra-abdominal lymph nodes: Secondary | ICD-10-CM

## 2019-03-08 ENCOUNTER — Other Ambulatory Visit: Payer: Self-pay

## 2019-03-08 ENCOUNTER — Inpatient Hospital Stay (HOSPITAL_BASED_OUTPATIENT_CLINIC_OR_DEPARTMENT_OTHER): Payer: Medicare Other | Admitting: Oncology

## 2019-03-08 ENCOUNTER — Inpatient Hospital Stay: Payer: Medicare Other | Attending: Oncology

## 2019-03-08 VITALS — BP 130/100 | HR 81 | Temp 98.1°F | Wt 273.0 lb

## 2019-03-08 DIAGNOSIS — Z841 Family history of disorders of kidney and ureter: Secondary | ICD-10-CM | POA: Diagnosis not present

## 2019-03-08 DIAGNOSIS — F419 Anxiety disorder, unspecified: Secondary | ICD-10-CM | POA: Insufficient documentation

## 2019-03-08 DIAGNOSIS — Z8249 Family history of ischemic heart disease and other diseases of the circulatory system: Secondary | ICD-10-CM | POA: Diagnosis not present

## 2019-03-08 DIAGNOSIS — F329 Major depressive disorder, single episode, unspecified: Secondary | ICD-10-CM | POA: Insufficient documentation

## 2019-03-08 DIAGNOSIS — Z87891 Personal history of nicotine dependence: Secondary | ICD-10-CM | POA: Diagnosis not present

## 2019-03-08 DIAGNOSIS — Z923 Personal history of irradiation: Secondary | ICD-10-CM | POA: Diagnosis not present

## 2019-03-08 DIAGNOSIS — R400 Somnolence: Secondary | ICD-10-CM | POA: Diagnosis not present

## 2019-03-08 DIAGNOSIS — Z8261 Family history of arthritis: Secondary | ICD-10-CM | POA: Diagnosis not present

## 2019-03-08 DIAGNOSIS — J90 Pleural effusion, not elsewhere classified: Secondary | ICD-10-CM | POA: Diagnosis not present

## 2019-03-08 DIAGNOSIS — G35 Multiple sclerosis: Secondary | ICD-10-CM | POA: Diagnosis not present

## 2019-03-08 DIAGNOSIS — K5900 Constipation, unspecified: Secondary | ICD-10-CM | POA: Insufficient documentation

## 2019-03-08 DIAGNOSIS — Z836 Family history of other diseases of the respiratory system: Secondary | ICD-10-CM | POA: Diagnosis not present

## 2019-03-08 DIAGNOSIS — Z833 Family history of diabetes mellitus: Secondary | ICD-10-CM | POA: Insufficient documentation

## 2019-03-08 DIAGNOSIS — Z811 Family history of alcohol abuse and dependence: Secondary | ICD-10-CM | POA: Insufficient documentation

## 2019-03-08 DIAGNOSIS — R5383 Other fatigue: Secondary | ICD-10-CM | POA: Diagnosis not present

## 2019-03-08 DIAGNOSIS — C8293 Follicular lymphoma, unspecified, intra-abdominal lymph nodes: Secondary | ICD-10-CM

## 2019-03-08 DIAGNOSIS — R590 Localized enlarged lymph nodes: Secondary | ICD-10-CM | POA: Diagnosis not present

## 2019-03-08 DIAGNOSIS — I890 Lymphedema, not elsewhere classified: Secondary | ICD-10-CM | POA: Insufficient documentation

## 2019-03-08 DIAGNOSIS — Z823 Family history of stroke: Secondary | ICD-10-CM | POA: Diagnosis not present

## 2019-03-08 DIAGNOSIS — Z87440 Personal history of urinary (tract) infections: Secondary | ICD-10-CM | POA: Diagnosis not present

## 2019-03-08 DIAGNOSIS — D649 Anemia, unspecified: Secondary | ICD-10-CM | POA: Insufficient documentation

## 2019-03-08 DIAGNOSIS — C8200 Follicular lymphoma grade I, unspecified site: Secondary | ICD-10-CM | POA: Diagnosis present

## 2019-03-08 DIAGNOSIS — G4730 Sleep apnea, unspecified: Secondary | ICD-10-CM | POA: Insufficient documentation

## 2019-03-08 DIAGNOSIS — M7989 Other specified soft tissue disorders: Secondary | ICD-10-CM | POA: Diagnosis not present

## 2019-03-08 DIAGNOSIS — N83209 Unspecified ovarian cyst, unspecified side: Secondary | ICD-10-CM | POA: Insufficient documentation

## 2019-03-08 DIAGNOSIS — Z79899 Other long term (current) drug therapy: Secondary | ICD-10-CM | POA: Insufficient documentation

## 2019-03-08 LAB — CBC WITH DIFFERENTIAL/PLATELET
Abs Immature Granulocytes: 0.01 10*3/uL (ref 0.00–0.07)
Basophils Absolute: 0.1 10*3/uL (ref 0.0–0.1)
Basophils Relative: 1 %
Eosinophils Absolute: 0.2 10*3/uL (ref 0.0–0.5)
Eosinophils Relative: 4 %
HCT: 35.3 % — ABNORMAL LOW (ref 36.0–46.0)
Hemoglobin: 11.2 g/dL — ABNORMAL LOW (ref 12.0–15.0)
Immature Granulocytes: 0 %
Lymphocytes Relative: 11 %
Lymphs Abs: 0.6 10*3/uL — ABNORMAL LOW (ref 0.7–4.0)
MCH: 27.9 pg (ref 26.0–34.0)
MCHC: 31.7 g/dL (ref 30.0–36.0)
MCV: 87.8 fL (ref 80.0–100.0)
Monocytes Absolute: 0.5 10*3/uL (ref 0.1–1.0)
Monocytes Relative: 10 %
Neutro Abs: 3.7 10*3/uL (ref 1.7–7.7)
Neutrophils Relative %: 74 %
Platelets: 173 10*3/uL (ref 150–400)
RBC: 4.02 MIL/uL (ref 3.87–5.11)
RDW: 13.5 % (ref 11.5–15.5)
WBC: 4.9 10*3/uL (ref 4.0–10.5)
nRBC: 0 % (ref 0.0–0.2)

## 2019-03-08 LAB — COMPREHENSIVE METABOLIC PANEL
ALT: 19 U/L (ref 0–44)
AST: 20 U/L (ref 15–41)
Albumin: 4.1 g/dL (ref 3.5–5.0)
Alkaline Phosphatase: 68 U/L (ref 38–126)
Anion gap: 9 (ref 5–15)
BUN: 24 mg/dL — ABNORMAL HIGH (ref 8–23)
CO2: 26 mmol/L (ref 22–32)
Calcium: 9.4 mg/dL (ref 8.9–10.3)
Chloride: 101 mmol/L (ref 98–111)
Creatinine, Ser: 1.38 mg/dL — ABNORMAL HIGH (ref 0.44–1.00)
GFR calc Af Amer: 45 mL/min — ABNORMAL LOW (ref >60)
GFR calc non Af Amer: 39 mL/min — ABNORMAL LOW (ref >60)
Glucose, Bld: 97 mg/dL (ref 70–99)
Potassium: 4.3 mmol/L (ref 3.5–5.1)
Sodium: 136 mmol/L (ref 135–145)
Total Bilirubin: 0.5 mg/dL (ref 0.3–1.2)
Total Protein: 7.3 g/dL (ref 6.5–8.1)

## 2019-03-08 LAB — LACTATE DEHYDROGENASE: LDH: 122 U/L (ref 98–192)

## 2019-03-09 NOTE — Progress Notes (Signed)
Hematology/Oncology Consult note [facility]  [phone] [phone]  Patient Care Team: Kirk Ruths, MD as PCP - General (Internal Medicine) Sanda Klein, Satira Anis, MD as Attending Physician (Family Medicine) Christene Lye, MD (General Surgery) Clent Jacks, RN as Registered Nurse Abbie Sons, MD (Urology)   Name of the patient: [name]  [id number]  [birth year]   Date of visit: 03/09/19  Diagnosis-  Stage II follicular lymphomas/p 4 doses of rituxan. Currently under observation   Chief complaint/ Reason for visit-routine follow-up of follicular lymphoma  Heme/Onc history: 1. patient is a 68 year old female who has had ongoing problems with pain in her left groin as well as his left leg swelling.   2. She had a Doppler of her left lower extremity on 02/28/2016 which showed: 1. Mild to moderate edema within the left calf. No evidence for acute DVT from left groin to popliteal fossa. Slightly limited evaluation of calf veins due to edema. 2. Left inguinal enlarged lymph nodes up to 6.2 cm, findings could be secondary to reactive node, infection, inflammation, or lymphoproliferative disease.  3. She had a repeat Doppler on 05/24/2016 which showed: IMPRESSION: 1. No evidence of lower extremity deep vein thrombosis, left. 2. Left inguinal lymphadenopathy. Differential diagnosis: Reactive adenopathy, lymphoma, metastatic disease.  4. CT abdomen on 06/14/2016 showed:  IMPRESSION: Lymphadenopathy within the left inguinal region and the left iliac chain. This is highly suspicious for underlying neoplasm. Biopsy is recommended for further evaluation.  5. Patient was seen by Dr. Jamal Collin from surgery and underwent excisional biopsy of the leftinguinal lymph node under ultrasound on 07/04/2016. Final pathology showed DIAGNOSIS:  A-B. LYMPH NODE, LEFT INGUINAL; EXCISION:  - FOLLICULAR LYMPHOMA, GRADE 1-2,  PREDOMINANTLY FOLLICULAR.   6. Patient has multiple sclerosis and has h/o depression and anxiety. She was given clonopin this morning and currently patient is drowsy but able to be aroused. She drifts off to sleep in between conversation. She is here with her friend who helps her out. Patient lives with her husband at baseline and uses a walker to ambulate. Reports pain and discomfort in her left groin. Denies fevers, chills, night sweats or weight loss  7. PET/CT scan from 07/16/16 showed: IMPRESSION: 1. Pathologic and hypermetabolic lower retroperitoneal, pelvic, and inguinal adenopathy compatible with Deauville 5 malignancy. 2. Mildly prominent right lower paratracheal lymph node, Deauville 4. 3. Accentuated activity in the right lateral pterygoid muscle without CT correlate, probably incidental, merits attention on any follow up. 4. Other imaging findings of potential clinical significance: Airway thickening is present, suggesting bronchitis or reactive airways disease. Trace left pleural effusion. Coronary and aortoiliac atherosclerosis. Prominent stool throughout the colon favors constipation. Late phase healing fractures of left lower anterior ribs.  8. Bone marrow biopsy from 07/18/2016 did not show any evidence of lymphoma. HIV and hepatitis B and hepatitis C testing was negative  9. Patient received 5 doses of rituxan between April-July 2018. Also received RT to her inguinal region   Interval history-she was discharged from the hospital last month for another episode of UTI.  Presently patient reports feeling at her baseline.  She has a chronic Foley catheter but is able to ambulate around the house with the help of a walker.  She has chronic bilateral lower extremity swelling and lymphedema  ECOG PS- 3 Pain scale- 3 Opioid associated constipation- no  Review of systems- Review of Systems  Constitutional: Positive for malaise/fatigue. Negative for chills, fever and weight  loss.  °HENT: Negative for congestion, ear discharge and nosebleeds.   °Eyes: Negative for blurred vision.  °Respiratory: Negative for cough, hemoptysis, sputum production, shortness of breath and wheezing.   °Cardiovascular: Positive for leg swelling. Negative for chest pain, palpitations, orthopnea and claudication.  °Gastrointestinal: Negative for abdominal pain, blood in stool, constipation, diarrhea, heartburn, melena, nausea and vomiting.  °Genitourinary: Negative for dysuria, flank pain, frequency, hematuria and urgency.  °Musculoskeletal: Negative for back pain, joint pain and myalgias.  °Skin: Negative for rash.  °Neurological: Negative for dizziness, tingling, focal weakness, seizures, weakness and headaches.  °Endo/Heme/Allergies: Does not bruise/bleed easily.  °Psychiatric/Behavioral: Negative for depression and suicidal ideas. The patient does not have insomnia.   °  ° ° °No Known Allergies ° ° °Past Medical History:  °Diagnosis Date  °• Abdominal aortic atherosclerosis (HCC) 11/11/2016  °• ADHD   °• Anxiety   °• COPD (chronic obstructive pulmonary disease) (HCC)   °• Depression   ° major depressive  °• Dyspnea   ° doe  °• Edema   ° left leg  °• Follicular lymphoma (HCC)   ° B Cell  °• Follicular lymphoma grade II (HCC)   °• Hypertension   °• Hypotension   ° idiopathic  °• Kyphoscoliosis and scoliosis 11/26/2011  °• Morbid obesity (HCC) 01/05/2016  °• Multiple sclerosis (HCC)   °• Multiple sclerosis (HCC)   ° 1980's  °• Neuromuscular disorder (HCC)   °• Obstructive and reflux uropathy   ° foley  °• Pain   ° atypical facial  °• Peripheral vascular disease of lower extremity with ulceration (HCC) 11/08/2015  °• Skin ulcer (HCC) 11/08/2015  °• Weakness   ° generalized. has MS  ° ° ° °Past Surgical History:  °Procedure Laterality Date  °• BACK SURGERY N/A 2002  °• CYST EXCISION    ° lower back  °• INGUINAL LYMPH NODE BIOPSY Left 07/04/2016  ° Procedure: INGUINAL LYMPH NODE BIOPSY;  Surgeon: Seeplaputhur G Sankar,  MD;  Location: [facility] ORS;  Service: General;  Laterality: Left;  °• PORTACATH PLACEMENT N/A 07/22/2016  ° Procedure: INSERTION PORT-A-CATH;  Surgeon: Seeplaputhur G Sankar, MD;  Location: [facility] ORS;  Service: General;  Laterality: N/A;  °• TONSILLECTOMY AND ADENOIDECTOMY    °• TUBAL LIGATION    ° ° °Social History  ° °Socioeconomic History  °• Marital status: Married  °  Spouse name: Not on file  °• Number of children: 3  °• Years of education: Not on file  °• Highest education level: Not on file  °Occupational History  °• Occupation: disabled  °Social Needs  °• Financial resource strain: Not on file  °• Food insecurity  °  Worry: Not on file  °  Inability: Not on file  °• Transportation needs  °  Medical: Not on file  °  Non-medical: Not on file  °Tobacco Use  °• Smoking status: Former Smoker  °  Packs/day: 1.00  °  Years: 20.00  °  Pack years: 20.00  °  Types: Cigarettes  °  Start date: 04/30/1995  °  Quit date: 02/03/2016  °  Years since quitting: 3.0  °• Smokeless tobacco: Never Used  °Substance and Sexual Activity  °• Alcohol use: No  °  Alcohol/week: 0.0 standard drinks  °• Drug use: Yes  °  Types: Marijuana  °  Comment: smokes THC occasionally per pt   °• Sexual activity: Not Currently  °  Birth control/protection: None  °Lifestyle  °• Physical activity  °    Days per week: Not on file  °  Minutes per session: Not on file  °• Stress: Not on file  °Relationships  °• Social connections  °  Talks on phone: Not on file  °  Gets together: Not on file  °  Attends religious service: Not on file  °  Active member of club or organization: Not on file  °  Attends meetings of clubs or organizations: Not on file  °  Relationship status: Not on file  °• Intimate partner violence  °  Fear of current or ex partner: Not on file  °  Emotionally abused: Not on file  °  Physically abused: Not on file  °  Forced sexual activity: Not on file  °Other Topics Concern  °• Not on file  °Social History Narrative  ° She is married.    ° ° °Family History  °Problem Relation Age of Onset  °• COPD Mother   °• Diabetes Mother   °• Heart failure Mother   °• Alcohol abuse Father   °• Kidney disease Father   °• Kidney failure Father   °• Arthritis Sister   °• CAD Maternal Grandmother   °• Stroke Maternal Grandfather   °• Arthritis Sister   °• Mental illness Sister   °• Arthritis Brother   ° ° ° °Current Outpatient Medications:  °•  acetaminophen (TYLENOL) 325 MG tablet, Take 2 tablets (650 mg total) by mouth every 6 (six) hours as needed for mild pain (or Fever >/= 101)., Disp:  , Rfl:  °•  albuterol (VENTOLIN HFA) 108 (90 Base) MCG/ACT inhaler, Inhale 1 puff into the lungs every 4 (four) hours as needed for shortness of breath., Disp: , Rfl:  °•  amLODipine (NORVASC) 5 MG tablet, Take 5 mg by mouth daily., Disp: , Rfl:  °•  atorvastatin (LIPITOR) 10 MG tablet, Take 10 mg by mouth daily. , Disp: , Rfl:  °•  budesonide-formoterol (SYMBICORT) 160-4.5 MCG/ACT inhaler, Inhale 2 puffs into the lungs 2 (two) times daily., Disp: , Rfl:  °•  buPROPion (WELLBUTRIN XL) 300 MG 24 hr tablet, Take 300 mg by mouth daily. , Disp: , Rfl:  °•  clonazePAM (KLONOPIN) 1 MG tablet, Take 0.5 tablets (0.5 mg total) by mouth at bedtime., Disp: 5 tablet, Rfl: 0 °•  docusate sodium (COLACE) 100 MG capsule, Take 1 capsule (100 mg total) by mouth 2 (two) times daily. (Patient taking differently: Take 100 mg by mouth 2 (two) times daily as needed. ), Disp: 10 capsule, Rfl: 0 °•  DULoxetine (CYMBALTA) 60 MG capsule, Take 1 capsule (60 mg total) by mouth every morning., Disp: 30 capsule, Rfl: 3 °•  gabapentin (NEURONTIN) 600 MG tablet, Take 1 tablet (600 mg total) by mouth 3 (three) times daily., Disp: , Rfl:  °•  hydrALAZINE (APRESOLINE) 50 MG tablet, Take 50 mg by mouth 3 (three) times daily., Disp: , Rfl:  °•  interferon beta-1a (AVONEX) 30 MCG/0.5ML PSKT injection, Inject 30 mcg into the muscle every Monday., Disp: , Rfl:  °•  lisinopril (ZESTRIL) 20 MG tablet, Take 20 mg by  mouth daily., Disp: , Rfl:  °•  Multiple Vitamin (MULTIVITAMIN WITH MINERALS) TABS tablet, Take 1 tablet by mouth every evening. , Disp: , Rfl:  °•  potassium chloride (KLOR-CON) 10 MEQ tablet, Take 10 mEq by mouth daily. , Disp: , Rfl:  °•  QUEtiapine Fumarate (SEROQUEL XR) 150 MG 24 hr tablet, Take 150 mg by mouth at bedtime. , Disp: ,   Rfl: 4 °•  tiZANidine (ZANAFLEX) 4 MG tablet, Take 4 mg by mouth 3 (three) times daily., Disp: , Rfl:  °•  vitamin C (ASCORBIC ACID) 500 MG tablet, Take 500 mg by mouth every evening. , Disp: , Rfl:  °•  MYRBETRIQ 50 MG TB24 tablet, TAKE ONE TABLET BY MOUTH ONCE DAILY, Disp: 30 tablet, Rfl: 0 °•  Teriflunomide (AUBAGIO) 14 MG TABS, Take by mouth., Disp: , Rfl:  °•  traMADol (ULTRAM) 50 MG tablet, Take 1 tablet (50 mg total) by mouth every 6 (six) hours as needed for up to 20 doses for moderate pain. (Patient not taking: Reported on 03/05/2019), Disp: 20 tablet, Rfl: 0 °No current facility-administered medications for this visit.  ° °Facility-Administered Medications Ordered in Other Visits:  °•  heparin lock flush 100 unit/mL, 500 Units, Intracatheter, Once PRN, ,  C, MD ° °Physical exam:  °Vitals:  ° 03/08/19 1451  °BP: (!) 130/100  °Pulse: 81  °Temp: 98.1 °F (36.7 °C)  °TempSrc: Tympanic  °Weight: 273 lb (123.8 kg)  ° °Physical Exam °Constitutional:   °   General: She is not in acute distress. °   Appearance: She is obese.  °HENT:  °   Head: Normocephalic and atraumatic.  °Eyes:  °   Pupils: Pupils are equal, round, and reactive to light.  °Neck:  °   Musculoskeletal: Normal range of motion.  °Cardiovascular:  °   Rate and Rhythm: Normal rate and regular rhythm.  °   Heart sounds: Normal heart sounds.  °Pulmonary:  °   Effort: Pulmonary effort is normal.  °   Breath sounds: Normal breath sounds.  °Abdominal:  °   General: Bowel sounds are normal.  °   Palpations: Abdomen is soft.  °   Comments: Chronic Foley catheter in place  °Musculoskeletal:  °   Right lower leg: Edema  present.  °   Left lower leg: Edema present.  °Skin: °   General: Skin is warm and dry.  °Neurological:  °   Mental Status: She is alert and oriented to person, place, and time.  ° °  ° °CMP Latest Ref Rng & Units 03/08/2019  °Glucose 70 - 99 mg/dL 97  °BUN 8 - 23 mg/dL 24(H)  °Creatinine 0.44 - 1.00 mg/dL 1.38(H)  °Sodium 135 - 145 mmol/L 136  °Potassium 3.5 - 5.1 mmol/L 4.3  °Chloride 98 - 111 mmol/L 101  °CO2 22 - 32 mmol/L 26  °Calcium 8.9 - 10.3 mg/dL 9.4  °Total Protein 6.5 - 8.1 g/dL 7.3  °Total Bilirubin 0.3 - 1.2 mg/dL 0.5  °Alkaline Phos 38 - 126 U/L 68  °AST 15 - 41 U/L 20  °ALT 0 - 44 U/L 19  ° °CBC Latest Ref Rng & Units 03/08/2019  °WBC 4.0 - 10.5 K/uL 4.9  °Hemoglobin 12.0 - 15.0 g/dL 11.2(L)  °Hematocrit 36.0 - 46.0 % 35.3(L)  °Platelets 150 - 400 K/uL 173  ° ° °No images are attached to the encounter. ° °No results found. ° ° °Assessment and plan- Patient is a 68 y.o. female with history of follicular lymphoma s/p Rituxan and radiation in 2018 here for routine follow-up ° °Patient had a CT abdomen pelvis with contrast in September 2020 which did not show any evidence ofLymphadenopathy.  Patient's labs are otherwise unremarkable except for mild anemia which has remained stable.  From a lymphoma standpoint she seems to be doing well.  I will see her back in 6 months with CBC with   differential, CMP and LDH.  Patient was also noted to have an enlarging ovarian cyst and was seen by GYN oncology.  However due to her overall poor performance status due to multiple sclerosis, morbid obesity and sleep apnea she was not considered to be a suitable surgical candidate. Visit Diagnosis 1. Follicular lymphoma of intra-abdominal lymph nodes, unspecified follicular lymphoma type ([place])      Dr. Randa Evens, MD, MPH [facility] [id number] 03/09/2019 1:07 PM

## 2019-03-24 ENCOUNTER — Other Ambulatory Visit: Payer: Self-pay | Admitting: Family Medicine

## 2019-04-02 ENCOUNTER — Other Ambulatory Visit: Payer: Self-pay | Admitting: Urology

## 2019-04-20 ENCOUNTER — Encounter: Payer: Self-pay | Admitting: Emergency Medicine

## 2019-04-20 ENCOUNTER — Other Ambulatory Visit: Payer: Self-pay

## 2019-04-20 ENCOUNTER — Emergency Department: Payer: Medicare Other

## 2019-04-20 ENCOUNTER — Inpatient Hospital Stay
Admission: EM | Admit: 2019-04-20 | Discharge: 2019-04-24 | DRG: 683 | Disposition: A | Discharge status: Skilled Nursing Facility | Payer: Medicare Other | Attending: Internal Medicine | Admitting: Internal Medicine

## 2019-04-20 DIAGNOSIS — F329 Major depressive disorder, single episode, unspecified: Secondary | ICD-10-CM | POA: Diagnosis present

## 2019-04-20 DIAGNOSIS — D631 Anemia in chronic kidney disease: Secondary | ICD-10-CM | POA: Diagnosis present

## 2019-04-20 DIAGNOSIS — Z6841 Body Mass Index (BMI) 40.0 and over, adult: Secondary | ICD-10-CM | POA: Diagnosis not present

## 2019-04-20 DIAGNOSIS — E785 Hyperlipidemia, unspecified: Secondary | ICD-10-CM | POA: Diagnosis present

## 2019-04-20 DIAGNOSIS — R8271 Bacteriuria: Secondary | ICD-10-CM | POA: Diagnosis present

## 2019-04-20 DIAGNOSIS — J449 Chronic obstructive pulmonary disease, unspecified: Secondary | ICD-10-CM | POA: Diagnosis present

## 2019-04-20 DIAGNOSIS — M419 Scoliosis, unspecified: Secondary | ICD-10-CM | POA: Diagnosis present

## 2019-04-20 DIAGNOSIS — Z823 Family history of stroke: Secondary | ICD-10-CM | POA: Diagnosis not present

## 2019-04-20 DIAGNOSIS — Z833 Family history of diabetes mellitus: Secondary | ICD-10-CM | POA: Diagnosis not present

## 2019-04-20 DIAGNOSIS — Z841 Family history of disorders of kidney and ureter: Secondary | ICD-10-CM | POA: Diagnosis not present

## 2019-04-20 DIAGNOSIS — Z811 Family history of alcohol abuse and dependence: Secondary | ICD-10-CM

## 2019-04-20 DIAGNOSIS — Z79899 Other long term (current) drug therapy: Secondary | ICD-10-CM

## 2019-04-20 DIAGNOSIS — R3989 Other symptoms and signs involving the genitourinary system: Secondary | ICD-10-CM

## 2019-04-20 DIAGNOSIS — I129 Hypertensive chronic kidney disease with stage 1 through stage 4 chronic kidney disease, or unspecified chronic kidney disease: Secondary | ICD-10-CM | POA: Diagnosis present

## 2019-04-20 DIAGNOSIS — N1831 Chronic kidney disease, stage 3a: Secondary | ICD-10-CM | POA: Diagnosis present

## 2019-04-20 DIAGNOSIS — N183 Chronic kidney disease, stage 3 unspecified: Secondary | ICD-10-CM | POA: Diagnosis present

## 2019-04-20 DIAGNOSIS — N189 Chronic kidney disease, unspecified: Secondary | ICD-10-CM | POA: Diagnosis not present

## 2019-04-20 DIAGNOSIS — L89629 Pressure ulcer of left heel, unspecified stage: Secondary | ICD-10-CM | POA: Diagnosis present

## 2019-04-20 DIAGNOSIS — Z20828 Contact with and (suspected) exposure to other viral communicable diseases: Secondary | ICD-10-CM | POA: Diagnosis present

## 2019-04-20 DIAGNOSIS — I10 Essential (primary) hypertension: Secondary | ICD-10-CM | POA: Diagnosis not present

## 2019-04-20 DIAGNOSIS — Z825 Family history of asthma and other chronic lower respiratory diseases: Secondary | ICD-10-CM | POA: Diagnosis not present

## 2019-04-20 DIAGNOSIS — E860 Dehydration: Secondary | ICD-10-CM | POA: Diagnosis present

## 2019-04-20 DIAGNOSIS — R531 Weakness: Secondary | ICD-10-CM | POA: Diagnosis not present

## 2019-04-20 DIAGNOSIS — F419 Anxiety disorder, unspecified: Secondary | ICD-10-CM | POA: Diagnosis present

## 2019-04-20 DIAGNOSIS — Z8249 Family history of ischemic heart disease and other diseases of the circulatory system: Secondary | ICD-10-CM

## 2019-04-20 DIAGNOSIS — F909 Attention-deficit hyperactivity disorder, unspecified type: Secondary | ICD-10-CM | POA: Diagnosis present

## 2019-04-20 DIAGNOSIS — Z8572 Personal history of non-Hodgkin lymphomas: Secondary | ICD-10-CM

## 2019-04-20 DIAGNOSIS — G35 Multiple sclerosis: Secondary | ICD-10-CM | POA: Diagnosis present

## 2019-04-20 DIAGNOSIS — N179 Acute kidney failure, unspecified: Principal | ICD-10-CM | POA: Diagnosis present

## 2019-04-20 DIAGNOSIS — Z87891 Personal history of nicotine dependence: Secondary | ICD-10-CM

## 2019-04-20 LAB — CBC
HCT: 30.9 % — ABNORMAL LOW (ref 36.0–46.0)
Hemoglobin: 10.2 g/dL — ABNORMAL LOW (ref 12.0–15.0)
MCH: 27.1 pg (ref 26.0–34.0)
MCHC: 33 g/dL (ref 30.0–36.0)
MCV: 82.2 fL (ref 80.0–100.0)
Platelets: 175 10*3/uL (ref 150–400)
RBC: 3.76 MIL/uL — ABNORMAL LOW (ref 3.87–5.11)
RDW: 14.5 % (ref 11.5–15.5)
WBC: 9.6 10*3/uL (ref 4.0–10.5)
nRBC: 0 % (ref 0.0–0.2)

## 2019-04-20 LAB — URINALYSIS, COMPLETE (UACMP) WITH MICROSCOPIC
Bilirubin Urine: NEGATIVE
Bilirubin Urine: NEGATIVE
Glucose, UA: NEGATIVE mg/dL
Glucose, UA: NEGATIVE mg/dL
Ketones, ur: NEGATIVE mg/dL
Ketones, ur: NEGATIVE mg/dL
Nitrite: NEGATIVE
Nitrite: NEGATIVE
Protein, ur: 100 mg/dL — AB
Protein, ur: 100 mg/dL — AB
Specific Gravity, Urine: 1.018 (ref 1.005–1.030)
Specific Gravity, Urine: 1.017 (ref 1.005–1.030)
WBC, UA: >50 WBC/hpf — ABNORMAL HIGH (ref 0–5)
WBC, UA: >50 WBC/hpf — ABNORMAL HIGH (ref 0–5)
pH: 5 (ref 5.0–8.0)
pH: 5 (ref 5.0–8.0)

## 2019-04-20 LAB — BASIC METABOLIC PANEL
Anion gap: 14 (ref 5–15)
BUN: 47 mg/dL — ABNORMAL HIGH (ref 8–23)
CO2: 21 mmol/L — ABNORMAL LOW (ref 22–32)
Calcium: 8.5 mg/dL — ABNORMAL LOW (ref 8.9–10.3)
Chloride: 96 mmol/L — ABNORMAL LOW (ref 98–111)
Creatinine, Ser: 3.32 mg/dL — ABNORMAL HIGH (ref 0.44–1.00)
GFR calc Af Amer: 16 mL/min — ABNORMAL LOW (ref >60)
GFR calc non Af Amer: 14 mL/min — ABNORMAL LOW (ref >60)
Glucose, Bld: 99 mg/dL (ref 70–99)
Potassium: 4.1 mmol/L (ref 3.5–5.1)
Sodium: 131 mmol/L — ABNORMAL LOW (ref 135–145)

## 2019-04-20 LAB — LACTIC ACID, PLASMA: Lactic Acid, Venous: 1.4 mmol/L (ref 0.5–1.9)

## 2019-04-20 MED ORDER — SODIUM CHLORIDE 0.9% FLUSH: 3.0000 mL | Freq: Once | INTRAVENOUS | Status: DC

## 2019-04-20 MED ORDER — ASCORBIC ACID 500 MG PO TABS
500.0000 mg | ORAL_TABLET | Freq: Every evening | ORAL | Status: DC
  Administered 2019-04-21: 500 mg via ORAL
  Filled 2019-04-20: qty 1

## 2019-04-20 MED ORDER — MIRABEGRON ER 50 MG PO TB24
50.0000 mg | ORAL_TABLET | Freq: Every day | ORAL | Status: DC
  Administered 2019-04-21 – 2019-04-24 (×5): 50 mg via ORAL
  Filled 2019-04-20 (×5): qty 1

## 2019-04-20 MED ORDER — QUETIAPINE FUMARATE ER 50 MG PO TB24
150.0000 mg | ORAL_TABLET | Freq: Every day | ORAL | Status: DC
  Administered 2019-04-21 – 2019-04-23 (×4): 150 mg via ORAL
  Filled 2019-04-20 (×5): qty 3

## 2019-04-20 MED ORDER — TERIFLUNOMIDE 14 MG PO TABS: 14.0000 mg | ORAL_TABLET | Freq: Every day | ORAL | Status: DC

## 2019-04-20 MED ORDER — BACLOFEN 10 MG PO TABS
10.0000 mg | ORAL_TABLET | Freq: Three times a day (TID) | ORAL | Status: DC
  Administered 2019-04-21 – 2019-04-24 (×11): 10 mg via ORAL
  Filled 2019-04-20 (×13): qty 1

## 2019-04-20 MED ORDER — TIZANIDINE HCL 4 MG PO TABS
4.0000 mg | ORAL_TABLET | Freq: Three times a day (TID) | ORAL | Status: DC
  Administered 2019-04-21 – 2019-04-24 (×11): 4 mg via ORAL
  Filled 2019-04-20 (×13): qty 1

## 2019-04-20 MED ORDER — CLONAZEPAM 0.5 MG PO TABS
0.5000 mg | ORAL_TABLET | Freq: Every day | ORAL | Status: DC
  Administered 2019-04-20 – 2019-04-23 (×4): 0.5 mg via ORAL
  Filled 2019-04-20 (×4): qty 1

## 2019-04-20 MED ORDER — INTERFERON BETA-1A 30 MCG/0.5ML IM PSKT: 30.0000 ug | PREFILLED_SYRINGE | INTRAMUSCULAR | Status: DC

## 2019-04-20 MED ORDER — TRAZODONE HCL 50 MG PO TABS: 25.0000 mg | ORAL_TABLET | Freq: Every evening | ORAL | Status: DC | PRN

## 2019-04-20 MED ORDER — ADULT MULTIVITAMIN W/MINERALS CH
1.0000 | ORAL_TABLET | Freq: Every evening | ORAL | Status: DC
  Administered 2019-04-20 – 2019-04-23 (×4): 1 via ORAL
  Filled 2019-04-20 (×4): qty 1

## 2019-04-20 MED ORDER — POTASSIUM CHLORIDE CRYS ER 10 MEQ PO TBCR
10.0000 meq | EXTENDED_RELEASE_TABLET | Freq: Every day | ORAL | Status: DC
  Filled 2019-04-20: qty 1

## 2019-04-20 MED ORDER — ONDANSETRON HCL 4 MG PO TABS: 4.0000 mg | ORAL_TABLET | Freq: Four times a day (QID) | ORAL | Status: DC | PRN

## 2019-04-20 MED ORDER — GABAPENTIN 600 MG PO TABS
600.0000 mg | ORAL_TABLET | Freq: Three times a day (TID) | ORAL | Status: DC
  Administered 2019-04-20 – 2019-04-24 (×11): 600 mg via ORAL
  Filled 2019-04-20 (×13): qty 1

## 2019-04-20 MED ORDER — DULOXETINE HCL 60 MG PO CPEP
60.0000 mg | ORAL_CAPSULE | ORAL | Status: DC
  Administered 2019-04-21 – 2019-04-24 (×4): 60 mg via ORAL
  Filled 2019-04-20 (×4): qty 1

## 2019-04-20 MED ORDER — ACETAMINOPHEN 325 MG PO TABS
650.0000 mg | ORAL_TABLET | Freq: Four times a day (QID) | ORAL | Status: DC | PRN
  Administered 2019-04-22 – 2019-04-23 (×4): 650 mg via ORAL
  Filled 2019-04-20 (×4): qty 2

## 2019-04-20 MED ORDER — ALBUTEROL SULFATE HFA 108 (90 BASE) MCG/ACT IN AERS
1.0000 | INHALATION_SPRAY | RESPIRATORY_TRACT | Status: DC | PRN
  Filled 2019-04-20: qty 6.7

## 2019-04-20 MED ORDER — ONDANSETRON HCL 4 MG/2ML IJ SOLN: 4.0000 mg | Freq: Four times a day (QID) | INTRAMUSCULAR | Status: DC | PRN

## 2019-04-20 MED ORDER — POLYETHYLENE GLYCOL 3350 17 G PO PACK: 17.0000 g | PACK | Freq: Every day | ORAL | Status: DC | PRN

## 2019-04-20 MED ORDER — BUPROPION HCL ER (XL) 150 MG PO TB24
300.0000 mg | ORAL_TABLET | Freq: Every day | ORAL | Status: DC
  Administered 2019-04-20 – 2019-04-24 (×5): 300 mg via ORAL
  Filled 2019-04-20 (×5): qty 2

## 2019-04-20 MED ORDER — AMLODIPINE BESYLATE 5 MG PO TABS
5.0000 mg | ORAL_TABLET | Freq: Every day | ORAL | Status: DC
  Administered 2019-04-20: 5 mg via ORAL
  Filled 2019-04-20: qty 1

## 2019-04-20 MED ORDER — MOMETASONE FURO-FORMOTEROL FUM 200-5 MCG/ACT IN AERO
2.0000 | INHALATION_SPRAY | Freq: Two times a day (BID) | RESPIRATORY_TRACT | Status: DC
  Administered 2019-04-21 – 2019-04-24 (×7): 2 via RESPIRATORY_TRACT
  Filled 2019-04-20: qty 8.8

## 2019-04-20 MED ORDER — DOCUSATE SODIUM 100 MG PO CAPS
100.0000 mg | ORAL_CAPSULE | Freq: Two times a day (BID) | ORAL | Status: DC | PRN
  Administered 2019-04-22: 100 mg via ORAL
  Filled 2019-04-20: qty 1

## 2019-04-20 MED ORDER — ENOXAPARIN SODIUM 40 MG/0.4ML ~~LOC~~ SOLN: 40.0000 mg | SUBCUTANEOUS | Status: DC

## 2019-04-20 MED ORDER — LACTATED RINGERS IV BOLUS
1000.0000 mL | Freq: Once | INTRAVENOUS | Status: AC
  Administered 2019-04-20: 1000 mL via INTRAVENOUS

## 2019-04-20 MED ORDER — ACETAMINOPHEN 650 MG RE SUPP: 650.0000 mg | Freq: Four times a day (QID) | RECTAL | Status: DC | PRN

## 2019-04-20 MED ORDER — ATORVASTATIN CALCIUM 10 MG PO TABS
10.0000 mg | ORAL_TABLET | Freq: Every day | ORAL | Status: DC
  Administered 2019-04-21 – 2019-04-23 (×3): 10 mg via ORAL
  Filled 2019-04-20 (×3): qty 1

## 2019-04-20 MED ORDER — HYDRALAZINE HCL 50 MG PO TABS
50.0000 mg | ORAL_TABLET | Freq: Three times a day (TID) | ORAL | Status: DC
  Administered 2019-04-20: 50 mg via ORAL
  Filled 2019-04-20: qty 1

## 2019-04-20 MED ORDER — SODIUM CHLORIDE 0.9 % IV SOLN: INTRAVENOUS | Status: DC

## 2019-04-20 NOTE — ED Provider Notes (Signed)
[facility] Emergency Department Provider Note   ____________________________________________   First MD Initiated Contact with Patient 04/20/19 1924     (approximate)  I have reviewed the triage vital signs and the nursing notes.   HISTORY  Chief Complaint Weakness    HPI [name] is a 68 y.o. female with history of MS, urinary retention with chronic Foley, COPD who presents to the ED for generalized weakness.  Patient reports she has been feeling increasingly weak over the past 2 days.  She typically is able to get out of bed on her own and use a walker, but states she has been unable to do so more recently.  She has not noticed any fevers, but does complain of some suprapubic discomfort.  She has not had any flank pain and denies any nausea or vomiting.  She also denies any cough, chest pain, or shortness of breath.  She states that her appetite has been decreased since onset of weakness.        Past Medical History:  Diagnosis Date  . Abdominal aortic atherosclerosis ([place]) 11/11/2016  . ADHD   . Anxiety   . COPD (chronic obstructive pulmonary disease) ([place])   . Depression    major depressive  . Dyspnea    doe  . Edema    left leg  . Follicular lymphoma ([place])    B Cell  . Follicular lymphoma grade II ([facility] [facility])   . Hypertension   . Hypotension    idiopathic  . Kyphoscoliosis and scoliosis 11/26/2011  . Morbid obesity ([place]) 01/05/2016  . Multiple sclerosis ([place])   . Multiple sclerosis ([place])    1980's  . Neuromuscular disorder ([place])   . Obstructive and reflux uropathy    foley  . Pain    atypical facial  . Peripheral vascular disease of lower extremity with ulceration ([place]) 11/08/2015  . Skin ulcer ([place]) 11/08/2015  . Weakness    generalized. has MS    Patient Active Problem List   Diagnosis Date Noted  . AKI (acute kidney injury) ([place]) 04/20/2019  . Pressure injury of skin 02/03/2019  . Bilateral lower leg cellulitis 02/02/2019  .  Ovarian mass, left 01/27/2019  . Sepsis ([place]) 01/05/2019  . UTI (urinary tract infection) 06/13/2018  . Altered mental status 06/11/2018  . Fall 05/13/2018  . Depression 05/13/2018  . Recurrent cellulitis of lower extremity 06/25/2017  . Medication monitoring encounter 06/05/2017  . Cellulitis of left lower leg 04/25/2017  . Adjustment disorder with mixed disturbance of emotions and conduct 04/23/2017  . Foot pain, bilateral 02/03/2017  . Tinea pedis 02/03/2017  . Left ovarian cyst 12/09/2016  . Abdominal aortic atherosclerosis ([place]) 11/11/2016  . Swelling of lower extremity 10/11/2016  . Obstructive sleep apnea 10/11/2016  . Follicular lymphoma of intra-abdominal lymph nodes ([place]) 08/06/2016  . Multiple falls 06/07/2016  . Inguinal adenopathy 05/29/2016  . Obesity, morbid ([place]) 01/05/2016  . Low HDL (under 40) 12/19/2015  . Skin ulcer ([place]) 11/08/2015  . Peripheral vascular disease of lower extremity with ulceration ([place]) 11/08/2015  . CKD (chronic kidney disease) stage 3, GFR 30-59 ml/min 11/08/2015  . Constipation due to pain medication 01/31/2015  . Obstructive sleep apnea of adult 01/13/2015  . Pelvic muscle wasting 01/13/2015  . Incomplete bladder emptying 01/13/2015  . Headache, migraine 10/24/2014  . 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 Current tobacco use 10/24/2014  . Lumbar radiculopathy, chronic 10/02/2013  . COPD with bronchial hyperresponsiveness ([place]) 10/02/2013  . Major  depressive disorder, recurrent, in partial remission ([place]) 10/02/2013  . Hypertension goal BP (blood pressure) < 140/90 10/02/2013  . Multiple sclerosis ([place]) 10/02/2013  . Absence of bladder continence 09/25/2012  . Acontractile bladder 02/12/2012  . Narrowing of intervertebral disc space 11/26/2011  . Kyphoscoliosis and scoliosis 11/26/2011    Past Surgical History:  Procedure Laterality Date  . BACK SURGERY N/A 2002  . CYST EXCISION     lower back  . INGUINAL LYMPH NODE BIOPSY Left  07/04/2016   Procedure: INGUINAL LYMPH NODE BIOPSY;  Surgeon: Christene Lye, MD;  Location: [facility] ORS;  Service: General;  Laterality: Left;  . PORTACATH PLACEMENT N/A 07/22/2016   Procedure: INSERTION PORT-A-CATH;  Surgeon: Christene Lye, MD;  Location: [facility] ORS;  Service: General;  Laterality: N/A;  . TONSILLECTOMY AND ADENOIDECTOMY    . TUBAL LIGATION      Prior to Admission medications   Medication Sig Start Date End Date Taking? Authorizing Provider  amLODipine (NORVASC) 5 MG tablet Take 5 mg by mouth daily. 11/13/18  Yes [provider]  atorvastatin (LIPITOR) 10 MG tablet Take 10 mg by mouth daily.  01/14/19  Yes [provider]  baclofen (LIORESAL) 10 MG tablet Take 10 mg by mouth 3 (three) times daily. 04/10/19  Yes [provider]  budesonide-formoterol (SYMBICORT) 160-4.5 MCG/ACT inhaler Inhale 2 puffs into the lungs 2 (two) times daily. 07/08/18  Yes [provider]  buPROPion (WELLBUTRIN XL) 300 MG 24 hr tablet Take 300 mg by mouth daily.  01/21/19  Yes [provider]  clonazePAM (KLONOPIN) 1 MG tablet Take 0.5 tablets (0.5 mg total) by mouth at bedtime. 02/08/19  Yes Max Sane, MD  docusate sodium (COLACE) 100 MG capsule Take 1 capsule (100 mg total) by mouth 2 (two) times daily. Patient taking differently: Take 100 mg by mouth 2 (two) times daily as needed.  01/08/19  Yes Gouru, Illene Silver, MD  DULoxetine (CYMBALTA) 60 MG capsule Take 1 capsule (60 mg total) by mouth every morning. 10/14/16  Yes Vaughan Basta, MD  gabapentin (NEURONTIN) 600 MG tablet Take 1 tablet (600 mg total) by mouth 3 (three) times daily. 02/09/17  Yes Mody, Ulice Bold, MD  hydrALAZINE (APRESOLINE) 50 MG tablet Take 50 mg by mouth 3 (three) times daily. 11/13/18  Yes [provider]  interferon beta-1a (AVONEX) 30 MCG/0.5ML PSKT injection Inject 30 mcg into the muscle every Monday.   Yes [provider]  lisinopril (ZESTRIL) 20 MG tablet  Take 20 mg by mouth daily. 12/02/18  Yes [provider]  Multiple Vitamin (MULTIVITAMIN WITH MINERALS) TABS tablet Take 1 tablet by mouth every evening.    Yes [provider]  MYRBETRIQ 50 MG TB24 tablet TAKE ONE TABLET BY MOUTH ONCE DAILY Patient taking differently: Take 50 mg by mouth daily. TAKE ONE TABLET BY MOUTH ONCE DAILY 03/08/19  Yes Stoioff, Ronda Fairly, MD  potassium chloride (KLOR-CON) 10 MEQ tablet Take 10 mEq by mouth daily.  01/18/19  Yes [provider]  QUEtiapine Fumarate (SEROQUEL XR) 150 MG 24 hr tablet Take 150 mg by mouth at bedtime.    Yes [provider]  Teriflunomide (AUBAGIO) 14 MG TABS Take 14 mg by mouth daily.  12/15/18  Yes [provider]  tiZANidine (ZANAFLEX) 4 MG tablet Take 4 mg by mouth 3 (three) times daily. 10/06/18  Yes [provider]  vitamin C (ASCORBIC ACID) 500 MG tablet Take 500 mg by mouth every evening.    Yes [provider]  acetaminophen (TYLENOL) 325 MG tablet Take 2 tablets (650 mg total) by mouth every 6 (six) hours as needed for mild pain (or Fever >/= 101). 01/08/19   Gouru, Illene Silver, MD  albuterol (VENTOLIN HFA) 108 (90 Base) MCG/ACT inhaler Inhale 1 puff into the lungs every 4 (four) hours as needed for shortness of breath. 07/07/18   [provider]    Allergies Patient has no known allergies.  Family History  Problem Relation Age of Onset  . COPD Mother   . Diabetes Mother   . Heart failure Mother   . Alcohol abuse Father   . Kidney disease Father   . Kidney failure Father   . Arthritis Sister   . CAD Maternal Grandmother   . Stroke Maternal Grandfather   . Arthritis Sister   . Mental illness Sister   . Arthritis Brother     Social History Social History   Tobacco Use  . Smoking status: Former Smoker    Packs/day: 1.00    Years: 20.00    Pack years: 20.00    Types: Cigarettes    Start date: 04/30/1995    Quit date: 02/03/2016    Years since quitting: 3.2  .  Smokeless tobacco: Never Used  Substance Use Topics  . Alcohol use: No    Alcohol/week: 0.0 standard drinks  . Drug use: Yes    Types: Marijuana    Comment: smokes THC occasionally per pt     Review of Systems  Constitutional: No fever/chills.  Positive for generalized weakness. Eyes: No visual changes. ENT: No sore throat. Cardiovascular: Denies chest pain. Respiratory: Denies shortness of breath. Gastrointestinal: Positive for abdominal pain.  No nausea, no vomiting.  No diarrhea.  No constipation. Genitourinary: Negative for dysuria. Musculoskeletal: Negative for back pain. Skin: Negative for rash. Neurological: Negative for headaches, focal weakness or numbness.  ____________________________________________   PHYSICAL EXAM:  VITAL SIGNS: ED Triage Vitals  Enc Vitals Group     BP 04/20/19 1437 119/89     Pulse Rate 04/20/19 1437 78     Resp 04/20/19 1437 16     Temp 04/20/19 1437 97.9 F (36.6 C)     Temp src --      SpO2 04/20/19 1437 99 %     Weight 04/20/19 1434 272 lb 14.9 oz (123.8 kg)     Height --      Head Circumference --      Peak Flow --      Pain Score 04/20/19 1433 9     Pain Loc --      Pain Edu? --      Excl. in [place]? --     Constitutional: Alert and oriented. Eyes: Conjunctivae are normal. Head: Atraumatic. Nose: No congestion/rhinnorhea. Mouth/Throat: Mucous membranes are dry. Neck: Normal ROM Cardiovascular: Normal rate, regular rhythm. Grossly normal heart sounds. Respiratory: Normal respiratory effort.  No retractions. Lungs CTAB. Gastrointestinal: Soft and mildly tender to palpation in suprapubic area. No distention. Genitourinary: deferred Musculoskeletal: No lower extremity tenderness nor edema. Neurologic:  Normal speech and language. No gross focal neurologic deficits are appreciated. Skin:  Skin is warm, dry and intact. No rash noted. Psychiatric: Mood and affect are normal. Speech and behavior are  normal.  ____________________________________________   LABS (all labs ordered are listed, but only abnormal results are displayed)  Labs Reviewed  BASIC METABOLIC PANEL - Abnormal; Notable for the following components:      Result Value   Sodium 131 (*)  Chloride 96 (*)    CO2 21 (*)    BUN 47 (*)    Creatinine, Ser 3.32 (*)    Calcium 8.5 (*)    GFR calc non Af Amer 14 (*)    GFR calc Af Amer 16 (*)    All other components within normal limits  CBC - Abnormal; Notable for the following components:   RBC 3.76 (*)    Hemoglobin 10.2 (*)    HCT 30.9 (*)    All other components within normal limits  URINALYSIS, COMPLETE (UACMP) WITH MICROSCOPIC - Abnormal; Notable for the following components:   Color, Urine AMBER (*)    APPearance TURBID (*)    Hgb urine dipstick MODERATE (*)    Protein, ur 100 (*)    Leukocytes,Ua LARGE (*)    WBC, UA >50 (*)    Bacteria, UA MANY (*)    All other components within normal limits  URINE CULTURE  SARS CORONAVIRUS 2 (TAT 6-24 HRS)  LACTIC ACID, PLASMA  LACTIC ACID, PLASMA  URINALYSIS, COMPLETE (UACMP) WITH MICROSCOPIC  CBG MONITORING, ED   ____________________________________________  EKG  ED ECG REPORT I, Blake Divine, the attending physician, personally viewed and interpreted this ECG.   Date: 04/20/2019  EKG Time: 14:48  Rate: 77  Rhythm: normal sinus rhythm  Axis: Normal  Intervals:none  ST&T Change: None   PROCEDURES  Procedure(s) performed (including Critical Care):  Procedures   ____________________________________________   INITIAL IMPRESSION / ASSESSMENT AND PLAN / ED COURSE       68 year old female with history of MS and urinary retention with chronic indwelling Foley presents to the ED with increasing weakness over the past few days, now is unable to walk.  She denies any fevers but does have some suprapubic discomfort similar to prior UTIs.  Initial UA concerning for infection but was obtained from  existing Foley catheter, will replace Foley catheter and recheck UA but would have high suspicion for UTI given her symptoms and history of recurrent UTI.  She does overall appear well and there are no findings consistent with sepsis.  Labs are significant for AKI, patient also appears dehydrated, will hydrate with IV fluids.  Plan to discuss with hospitalist for admission.        ____________________________________________   FINAL CLINICAL IMPRESSION(S) / ED DIAGNOSES  Final diagnoses:  AKI (acute kidney injury) ([place])  Generalized weakness     ED Discharge Orders    None       Note:  This document was prepared using Dragon voice recognition software and may include unintentional dictation errors.   Blake Divine, MD 04/20/19 2055

## 2019-04-20 NOTE — ED Triage Notes (Signed)
AR[facility] via [facility] with 3 day history of weakness.  Patient has history of recurrent UTI and an indwelling foley catheter, last changed 5 weeks ago, per patient.  Patietn states she has been unable to walk x 3 days and has been in her chair.  EMS reports that patient will require at least 2 people to transfer her from chair to stretcher.  Patient is AAOx3.  Skin warm and dry. NAD.  MAE equally and strong.  Speech clear.

## 2019-04-20 NOTE — ED Notes (Signed)
Pharmacy notified on meds.

## 2019-04-20 NOTE — H&P (Signed)
at [place] NAME: [name]    MR#:  [id number]  DATE OF BIRTH:  07/25/1950  DATE OF ADMISSION:  04/20/2019  PRIMARY CARE PHYSICIAN: Kirk Ruths, MD   REQUESTING/REFERRING PHYSICIAN: Blake Divine, MD CHIEF COMPLAINT:   Chief Complaint  Patient presents with  . Weakness    HISTORY OF PRESENT ILLNESS:  [name]  is a 68 y.o. Caucasian female with a known history of hypertension, B-cell lymphoma, multiple sclerosis, COPD, urinary retention with chronic indwelling Foley catheter, depression, anxiety and ADHD who presented to the emergency room with acute onset of generalized weakness over the last couple of days.  She stated that she has been feeling so weak she could not pick her feet up to the chair.  She usually ambulates with a walker and could not do that the last couple of days.  No fever or chills.  No nausea or vomiting or abdominal pain.  No headache or dizziness or blurred vision no worsening weakness or paresthesias.  She stated that her urine has been smelling horrible.  She has been having significant diminished appetite.  No recent exposure to COVID-19.  Upon presentation to the emergency room, vital signs were within normal and later blood pressure was 134/54.  Labs revealed anemia slightly worse than previous levels last month with hemoglobin of 10.2 and hematocrit 30.9 compared to 11.2/35.3.  Lactic acid was 1.4.  UA came back with more than 50 WBCs and many bacteria with 21-50 RBCs.  Repeat UA also showed more than 50 WBCs with 6-10 RBCs and WBC clumps were positive in both samples and protein was 100 in both with negative nitrite and large leukocytes.  BMP was remarkable for hyponatremia 131 and hypochloremia 96 compared to normal levels on 03/08/2019, BUN of 47 compared to 24 and creatinine of 3.32 compared to 1.38 then with baseline stage IIIa chronic kidney disease.  The patient was given 1 L bolus of IV lactated Ringer.   She will be admitted to a medical bed for further evaluation and management PAST MEDICAL HISTORY:   Past Medical History:  Diagnosis Date  . Abdominal aortic atherosclerosis ([place]) 11/11/2016  . ADHD   . Anxiety   . COPD (chronic obstructive pulmonary disease) ([place])   . Depression    major depressive  . Dyspnea    doe  . Edema    left leg  . Follicular lymphoma ([place])    B Cell  . Follicular lymphoma grade II ([place])   . Hypertension   . Hypotension    idiopathic  . Kyphoscoliosis and scoliosis 11/26/2011  . Morbid obesity ([place]) 01/05/2016  . Multiple sclerosis ([place])   . Multiple sclerosis ([place])    1980's  . Neuromuscular disorder ([place])   . Obstructive and reflux uropathy    foley  . Pain    atypical facial  . Peripheral vascular disease of lower extremity with ulceration ([place]) 11/08/2015  . Skin ulcer ([place]) 11/08/2015  . Weakness    generalized. has MS  Stage IIIa chronic kidney disease.  PAST SURGICAL HISTORY:   Past Surgical History:  Procedure Laterality Date  . BACK SURGERY N/A 2002  . CYST EXCISION     lower back  . INGUINAL LYMPH NODE BIOPSY Left 07/04/2016   Procedure: INGUINAL LYMPH NODE BIOPSY;  Surgeon: Christene Lye, MD;  Location: [facility] ORS;  Service: General;  Laterality: Left;  . PORTACATH PLACEMENT N/A 07/22/2016   Procedure: INSERTION PORT-A-CATH;  Surgeon: Christene Lye, MD;  Location: [facility] ORS;  Service: General;  Laterality: N/A;  . TONSILLECTOMY AND ADENOIDECTOMY    . TUBAL LIGATION      SOCIAL HISTORY:   Social History   Tobacco Use  . Smoking status: Former Smoker    Packs/day: 1.00    Years: 20.00    Pack years: 20.00    Types: Cigarettes    Start date: 04/30/1995    Quit date: 02/03/2016    Years since quitting: 3.2  . Smokeless tobacco: Never Used  Substance Use Topics  . Alcohol use: No    Alcohol/week: 0.0 standard drinks    FAMILY HISTORY:   Family History  Problem Relation Age of Onset  . COPD Mother   . Diabetes  Mother   . Heart failure Mother   . Alcohol abuse Father   . Kidney disease Father   . Kidney failure Father   . Arthritis Sister   . CAD Maternal Grandmother   . Stroke Maternal Grandfather   . Arthritis Sister   . Mental illness Sister   . Arthritis Brother     DRUG ALLERGIES:  No Known Allergies  REVIEW OF SYSTEMS:   ROS As per history of present illness. All pertinent systems were reviewed above. Constitutional,  HEENT, cardiovascular, respiratory, GI, GU, musculoskeletal, neuro, psychiatric, endocrine,  integumentary and hematologic systems were reviewed and are otherwise  negative/unremarkable except for positive findings mentioned above in the HPI.   MEDICATIONS AT HOME:   Prior to Admission medications   Medication Sig Start Date End Date Taking? Authorizing Provider  amLODipine (NORVASC) 5 MG tablet Take 5 mg by mouth daily. 11/13/18  Yes [provider]  atorvastatin (LIPITOR) 10 MG tablet Take 10 mg by mouth daily.  01/14/19  Yes [provider]  baclofen (LIORESAL) 10 MG tablet Take 10 mg by mouth 3 (three) times daily. 04/10/19  Yes [provider]  budesonide-formoterol (SYMBICORT) 160-4.5 MCG/ACT inhaler Inhale 2 puffs into the lungs 2 (two) times daily. 07/08/18  Yes [provider]  buPROPion (WELLBUTRIN XL) 300 MG 24 hr tablet Take 300 mg by mouth daily.  01/21/19  Yes [provider]  clonazePAM (KLONOPIN) 1 MG tablet Take 0.5 tablets (0.5 mg total) by mouth at bedtime. 02/08/19  Yes Max Sane, MD  docusate sodium (COLACE) 100 MG capsule Take 1 capsule (100 mg total) by mouth 2 (two) times daily. Patient taking differently: Take 100 mg by mouth 2 (two) times daily as needed.  01/08/19  Yes Gouru, Illene Silver, MD  DULoxetine (CYMBALTA) 60 MG capsule Take 1 capsule (60 mg total) by mouth every morning. 10/14/16  Yes Vaughan Basta, MD  gabapentin (NEURONTIN) 600 MG tablet Take 1 tablet (600 mg total) by mouth 3 (three)  times daily. 02/09/17  Yes Mody, Ulice Bold, MD  hydrALAZINE (APRESOLINE) 50 MG tablet Take 50 mg by mouth 3 (three) times daily. 11/13/18  Yes [provider]  interferon beta-1a (AVONEX) 30 MCG/0.5ML PSKT injection Inject 30 mcg into the muscle every Monday.   Yes [provider]  lisinopril (ZESTRIL) 20 MG tablet Take 20 mg by mouth daily. 12/02/18  Yes [provider]  Multiple Vitamin (MULTIVITAMIN WITH MINERALS) TABS tablet Take 1 tablet by mouth every evening.    Yes [provider]  MYRBETRIQ 50 MG TB24 tablet TAKE ONE TABLET BY MOUTH ONCE DAILY Patient taking differently: Take 50 mg by mouth daily. TAKE ONE TABLET BY MOUTH ONCE DAILY 03/08/19  Yes  Stoioff, Ronda Fairly, MD  potassium chloride (KLOR-CON) 10 MEQ tablet Take 10 mEq by mouth daily.  01/18/19  Yes [provider]  QUEtiapine Fumarate (SEROQUEL XR) 150 MG 24 hr tablet Take 150 mg by mouth at bedtime.    Yes [provider]  Teriflunomide (AUBAGIO) 14 MG TABS Take 14 mg by mouth daily.  12/15/18  Yes [provider]  tiZANidine (ZANAFLEX) 4 MG tablet Take 4 mg by mouth 3 (three) times daily. 10/06/18  Yes [provider]  vitamin C (ASCORBIC ACID) 500 MG tablet Take 500 mg by mouth every evening.    Yes [provider]  acetaminophen (TYLENOL) 325 MG tablet Take 2 tablets (650 mg total) by mouth every 6 (six) hours as needed for mild pain (or Fever >/= 101). 01/08/19   Gouru, Illene Silver, MD  albuterol (VENTOLIN HFA) 108 (90 Base) MCG/ACT inhaler Inhale 1 puff into the lungs every 4 (four) hours as needed for shortness of breath. 07/07/18   [provider]      VITAL SIGNS:  Blood pressure (!) 129/44, pulse 89, temperature 97.9 F (36.6 C), resp. rate 16, weight 123.8 kg, SpO2 94 %.  PHYSICAL EXAMINATION:  Physical Exam  GENERAL:  68 y.o.-year-old Caucasian female patient lying in the bed with no acute distress.  EYES: Pupils equal, round, reactive to light  and accommodation. No scleral icterus. Extraocular muscles intact.  HEENT: Head atraumatic, normocephalic. Oropharynx and nasopharynx clear.  NECK:  Supple, no jugular venous distention. No thyroid enlargement, no tenderness.  LUNGS: Normal breath sounds bilaterally, no wheezing, rales,rhonchi or crepitation. No use of accessory muscles of respiration.  CARDIOVASCULAR: Regular rate and rhythm, S1, S2 normal. No murmurs, rubs, or gallops.  ABDOMEN: Soft, nondistended, nontender. Bowel sounds present. No organomegaly or mass.  EXTREMITIES: No pedal edema, cyanosis, or clubbing.  NEUROLOGIC: Cranial nerves II through XII are intact. Muscle strength 5/5 in all extremities. Sensation intact. Gait not checked. GU: Foley catheter in place. PSYCHIATRIC: The patient is alert and oriented x 3.  Normal affect and good eye contact. SKIN: No obvious rash, lesion, or ulcer.   LABORATORY PANEL:   CBC Recent Labs  Lab 04/20/19 1443  WBC 9.6  HGB 10.2*  HCT 30.9*  PLT 175   ------------------------------------------------------------------------------------------------------------------  Chemistries  Recent Labs  Lab 04/20/19 1443  NA 131*  K 4.1  CL 96*  CO2 21*  GLUCOSE 99  BUN 47*  CREATININE 3.32*  CALCIUM 8.5*   ------------------------------------------------------------------------------------------------------------------  Cardiac Enzymes No results for input(s): TROPONINI in the last 168 hours. ------------------------------------------------------------------------------------------------------------------  RADIOLOGY:  DG Chest Portable 1 View  Result Date: 04/20/2019 CLINICAL DATA:  Weakness for 3 days recurrent UTI an indwelling Foley catheter EXAM: PORTABLE CHEST 1 VIEW COMPARISON:  01/04/2019 FINDINGS: Cardiomediastinal contours are normal. Lungs are clear. No signs of pleural effusion. No acute bone process. IMPRESSION: No acute cardiopulmonary disease. Electronically  Signed   By: Zetta Bills M.D.   On: 04/20/2019 20:15      IMPRESSION AND PLAN:   1.  Acute kidney injury superimposed on stage IIIa chronic kidney disease.  This is likely prerenal secondary to volume depletion and dehydration.  The patient will be admitted to a medical bed.  She will be hydrated with IV normal saline.  We will hold off Zestril.  Will avoid nephrotoxic medications.  I will follow her BMP.  2.  Possible UTI in the setting of chronic indwelling Foley cath.  We will place the patient on  IV Rocephin pending urine culture and sensitivity results.  3.  Generalized weakness likely secondary to #1 and #2.  Management as above.  Physical therapy consult will be obtained.  4.  COPD.  No current exacerbation.  We will continue her Symbicort and albuterol MDI.  5.  Hypertension.  We will continue her Norvasc, hydralazine and hold off lisinopril given acute kidney injury.  6.  Anxiety and depression.  We will continue her Wellbutrin XL, Cymbalta and Klonopin.  7.  Dyslipidemia.  We will continue statin therapy.  8.  Multiple sclerosis.  We will continue her baclofen and Zanaflex.  She gets weekly Avonex on Mondays.  I do not believe her generalized weakness is a flare of her multiple sclerosis.  We will still get neurology follow-up consultation for further assessment.  9.  DVT prophylaxis.  Subcutaneous Lovenox    All the records are reviewed and case discussed with ED provider. The plan of care was discussed in details with the patient (and family). I answered all questions. The patient agreed to proceed with the above mentioned plan. Further management will depend upon hospital course.   CODE STATUS: Full code  TOTAL TIME TAKING CARE OF THIS PATIENT: 55 minutes.    Christel Mormon M.D on 04/20/2019 at 9:40 PM  [facility]   From 7 PM-7 AM, contact night-coverage [email or web address]  CC: Primary care physician; Kirk Ruths, MD   Note: This dictation was  prepared with Dragon dictation along with smaller phrase technology. Any transcriptional errors that result from this process are unintentional.

## 2019-04-21 ENCOUNTER — Inpatient Hospital Stay: Payer: Medicare Other

## 2019-04-21 LAB — SARS CORONAVIRUS 2 (TAT 6-24 HRS): SARS Coronavirus 2: NEGATIVE

## 2019-04-21 LAB — BASIC METABOLIC PANEL
Anion gap: 14 (ref 5–15)
BUN: 44 mg/dL — ABNORMAL HIGH (ref 8–23)
CO2: 17 mmol/L — ABNORMAL LOW (ref 22–32)
Calcium: 8.1 mg/dL — ABNORMAL LOW (ref 8.9–10.3)
Chloride: 100 mmol/L (ref 98–111)
Creatinine, Ser: 2.86 mg/dL — ABNORMAL HIGH (ref 0.44–1.00)
GFR calc Af Amer: 19 mL/min — ABNORMAL LOW (ref >60)
GFR calc non Af Amer: 16 mL/min — ABNORMAL LOW (ref >60)
Glucose, Bld: 77 mg/dL (ref 70–99)
Potassium: 3.8 mmol/L (ref 3.5–5.1)
Sodium: 131 mmol/L — ABNORMAL LOW (ref 135–145)

## 2019-04-21 LAB — CBC
HCT: 27.2 % — ABNORMAL LOW (ref 36.0–46.0)
Hemoglobin: 9.2 g/dL — ABNORMAL LOW (ref 12.0–15.0)
MCH: 27.4 pg (ref 26.0–34.0)
MCHC: 33.8 g/dL (ref 30.0–36.0)
MCV: 81 fL (ref 80.0–100.0)
Platelets: 166 10*3/uL (ref 150–400)
RBC: 3.36 MIL/uL — ABNORMAL LOW (ref 3.87–5.11)
RDW: 14.2 % (ref 11.5–15.5)
WBC: 7 10*3/uL (ref 4.0–10.5)
nRBC: 0 % (ref 0.0–0.2)

## 2019-04-21 LAB — GLUCOSE, CAPILLARY: Glucose-Capillary: 73 mg/dL (ref 70–99)

## 2019-04-21 LAB — LACTIC ACID, PLASMA: Lactic Acid, Venous: 0.8 mmol/L (ref 0.5–1.9)

## 2019-04-21 MED ORDER — CHLORHEXIDINE GLUCONATE CLOTH 2 % EX PADS
6.0000 | MEDICATED_PAD | Freq: Every day | CUTANEOUS | Status: DC
  Administered 2019-04-23 – 2019-04-24 (×2): 6 via TOPICAL

## 2019-04-21 MED ORDER — HEPARIN SODIUM (PORCINE) 5000 UNIT/ML IJ SOLN
5000.0000 [IU] | Freq: Three times a day (TID) | INTRAMUSCULAR | Status: DC
  Administered 2019-04-21 – 2019-04-24 (×8): 5000 [IU] via SUBCUTANEOUS
  Filled 2019-04-21 (×7): qty 1

## 2019-04-21 MED ORDER — LACTATED RINGERS IV SOLN: INTRAVENOUS | Status: DC

## 2019-04-21 NOTE — TOC Initial Note (Signed)
Transition of Care [facility]) - Initial/Assessment Note    Patient Details  Name: [name] MRN: [id number] Date of Birth: [birth year]  Transition of [facility]) CM/SW Contact:    Su Hilt, RN Phone Number: 04/21/2019, 3:36 PM  Clinical Narrative:                 Met with the patient to discuss DC plan and needs She is willing to go to SNF for rehab, FL2, done, PASSR is pending, bed search complete, will review Bed offers once obtained  Expected Discharge Plan: Skilled Nursing Facility Barriers to Discharge: Insurance Authorization, Continued Medical Work up, SNF Pending bed offer   Patient Goals and CMS Choice        Expected Discharge Plan and Services Expected Discharge Plan: [place]                         DME Arranged: N/A         HH Arranged: NA          Prior Living Arrangements/Services     Patient language and need for interpreter reviewed:: Yes Do you feel safe going back to the place where you live?: Yes      Need for Family Participation in Patient Care: No (Comment) Care giver support system in place?: Yes (comment)   Criminal Activity/Legal Involvement Pertinent to Current Situation/Hospitalization: No - Comment as needed  Activities of Daily Living      Permission Sought/Granted   Permission granted to share information with : Yes, Verbal Permission Granted              Emotional Assessment       Orientation: : Oriented to Self, Oriented to  Time, Oriented to Situation, Oriented to Place Alcohol / Substance Use: Illicit Drugs, Not Applicable Psych Involvement: No (comment)  Admission diagnosis:  Generalized weakness [R53.1] AKI (acute kidney injury) ([place]) [N17.9] Patient Active Problem List   Diagnosis Date Noted  . AKI (acute kidney injury) ([place]) 04/20/2019  . Pressure injury of skin 02/03/2019  . Bilateral lower leg cellulitis 02/02/2019  . Ovarian mass, left 01/27/2019  . Sepsis ([place]) 01/05/2019   . UTI (urinary tract infection) 06/13/2018  . Altered mental status 06/11/2018  . Fall 05/13/2018  . Depression 05/13/2018  . Recurrent cellulitis of lower extremity 06/25/2017  . Medication monitoring encounter 06/05/2017  . Cellulitis of left lower leg 04/25/2017  . Adjustment disorder with mixed disturbance of emotions and conduct 04/23/2017  . Foot pain, bilateral 02/03/2017  . Tinea pedis 02/03/2017  . Left ovarian cyst 12/09/2016  . Abdominal aortic atherosclerosis ([place]) 11/11/2016  . Swelling of lower extremity 10/11/2016  . Obstructive sleep apnea 10/11/2016  . Follicular lymphoma of intra-abdominal lymph nodes ([place]) 08/06/2016  . Multiple falls 06/07/2016  . Inguinal adenopathy 05/29/2016  . Obesity, morbid ([place]) 01/05/2016  . Low HDL (under 40) 12/19/2015  . Skin ulcer ([place]) 11/08/2015  . Peripheral vascular disease of lower extremity with ulceration ([place]) 11/08/2015  . CKD (chronic kidney disease) stage 3, GFR 30-59 ml/min 11/08/2015  . Constipation due to pain medication 01/31/2015  . Obstructive sleep apnea of adult 01/13/2015  . Pelvic muscle wasting 01/13/2015  . Incomplete bladder emptying 01/13/2015  . Headache, migraine 10/24/2014  . 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 Current tobacco use 10/24/2014  . Lumbar radiculopathy, chronic 10/02/2013  . COPD with bronchial hyperresponsiveness ([place]) 10/02/2013  . Major depressive disorder, recurrent, in partial  remission ([place]) 10/02/2013  . Hypertension goal BP (blood pressure) < 140/90 10/02/2013  . Multiple sclerosis ([place]) 10/02/2013  . Absence of bladder continence 09/25/2012  . Acontractile bladder 02/12/2012  . Narrowing of intervertebral disc space 11/26/2011  . Kyphoscoliosis and scoliosis 11/26/2011   PCP:  Kirk Ruths, MD Pharmacy:   [place], [place] - [address] [place] [address] Phone: [phone] Fax: [phone]  [facility], [place], [place] - [address] [place] [address] Phone: [phone] Fax: [phone]     Social Determinants of Health (SDOH) Interventions    Readmission Risk Interventions Readmission Risk Prevention Plan 02/04/2019 01/07/2019  Transportation Screening - Complete  PCP or Specialist Appt within 3-5 Days - Complete  HRI or [place] Complete Complete  Social Work Consult for [place] Planning/Counseling Complete Complete  Palliative Care Screening Not Applicable Not Applicable  Medication Review Press photographer) - Complete  Some recent data might be hidden

## 2019-04-21 NOTE — Progress Notes (Signed)
Spoke with Pts Husband, RB.  He will bring pts home dose of Abinex today.

## 2019-04-21 NOTE — Progress Notes (Signed)
OT Cancellation Note  Patient Details Name: [name] MRN: [id number] DOB: 1950/08/11   Cancelled Treatment:    Reason Eval/Treat Not Completed: Other (comment)  OT consult received and chart reviewed. Pt politely declines to participate in OT evaluation at this time. OT attempts to encourage participation, providing education re: role of OT, but pt states "I just don't feel right, I just want to lay in the bed." OT will f/u for evaluation as able/as pt becomes agreeable on next date.   Gerrianne Scale, [place], OTR/L ascom [phone] 04/21/19, 3:34 PM

## 2019-04-21 NOTE — Plan of Care (Signed)
  Problem: Pain Managment: Goal: General experience of comfort will improve Outcome: Completed/Met

## 2019-04-21 NOTE — Plan of Care (Signed)
  Problem: Education: Goal: Knowledge of General Education information will improve Description: Including pain rating scale, medication(s)/side effects and non-pharmacologic comfort measures Outcome: Progressing   Problem: Health Behavior/Discharge Planning: Goal: Ability to manage health-related needs will improve Outcome: Progressing   Problem: Clinical Measurements: Goal: Ability to maintain clinical measurements within normal limits will improve Outcome: Progressing Goal: Will remain free from infection Outcome: Progressing Goal: Diagnostic test results will improve Outcome: Progressing Goal: Respiratory complications will improve Outcome: Progressing Goal: Cardiovascular complication will be avoided Outcome: Progressing   Problem: Activity: Goal: Risk for activity intolerance will decrease Outcome: Progressing   Problem: Nutrition: Goal: Adequate nutrition will be maintained Outcome: Progressing   Problem: Coping: Goal: Level of anxiety will decrease Outcome: Progressing   Problem: Elimination: Goal: Will not experience complications related to bowel motility Outcome: Progressing Goal: Will not experience complications related to urinary retention Outcome: Progressing   Problem: Skin Integrity: Goal: Risk for impaired skin integrity will decrease Outcome: Progressing   Problem: Education: Goal: Knowledge of the prescribed therapeutic regimen will improve Outcome: Progressing Goal: Understanding of discharge needs will improve Outcome: Progressing Goal: Individualized Educational Video(s) Outcome: Progressing   Problem: Activity: Goal: Ability to avoid complications of mobility impairment will improve Outcome: Progressing Goal: Ability to tolerate increased activity will improve Outcome: Progressing   Problem: Clinical Measurements: Goal: Postoperative complications will be avoided or minimized Outcome: Progressing   Problem: Pain Management: Goal:  Pain level will decrease with appropriate interventions Outcome: Progressing   Problem: Skin Integrity: Goal: Will show signs of wound healing Outcome: Progressing

## 2019-04-21 NOTE — Progress Notes (Signed)
Pt to ultrasound

## 2019-04-21 NOTE — Evaluation (Signed)
Physical Therapy Evaluation Patient Details Name: [name] MRN: [id number] DOB: 1950-07-26 Today's Date: 04/21/2019   History of Present Illness  Pt is a 68 y.o. female presenting to hospital 04/20/19 with generalized weakness.  Pt admitted with AKI superimposed on stage IVa CKD, possible UTI in setting of chronic indwelling foley cath, and generalized weakness.  PMH includes ADHD, anxiety, kyphoscoliosis, PVD, L LE cellulitis, MS, htn, COPD, urinary retention with chronic foley, portacath, back surgery, B-cell lymphoma.  Clinical Impression  Prior to hospital admission, pt was ambulatory household distances with rollator and lives with her husband in 1 level home (pt reports steps to enter).  Pt sleeping upon PT arrival but woken with vc's and extra time (pt appearing drowsy initially but improved alertness during session noted).  Currently pt requires 1-2 assist with bed mobility and unable to stand (x3 trials) with 1 assist and walker use.  Pt would benefit from skilled PT to address noted impairments and functional limitations (see below for any additional details).  Upon hospital discharge, pt would benefit from STR.    Follow Up Recommendations SNF    Equipment Recommendations  Rolling walker with 5" wheels;3in1 (PT);Wheelchair (measurements PT);Wheelchair cushion (measurements PT)    Recommendations for Other Services OT consult     Precautions / Restrictions Precautions Precautions: Fall Precaution Comments: foley catheter Restrictions Weight Bearing Restrictions: No      Mobility  Bed Mobility Overal bed mobility: Needs Assistance Bed Mobility: Supine to Sit;Sit to Supine     Supine to sit: Mod assist;Max assist;HOB elevated Sit to supine: +2 for physical assistance   General bed mobility comments: assist for trunk and LE's semi-supine to sitting edge of bed; 2 assist sit to supine and to scoot up in bed  Transfers Overall transfer level: Needs  assistance Equipment used: Rolling walker (2 wheeled) Transfers: Sit to/from Stand Sit to Stand: Total assist         General transfer comment: unable to stand with 3 trials (unable to clear pt's bottom from bed) with 1 assist  Ambulation/Gait             General Gait Details: unable to stand to attempt  Stairs            Wheelchair Mobility    Modified Rankin (Stroke Patients Only)       Balance Overall balance assessment: Needs assistance Sitting-balance support: No upper extremity supported;Feet supported Sitting balance-Leahy Scale: Good Sitting balance - Comments: steady sitting reaching within BOS                                     Pertinent Vitals/Pain Pain Assessment: No/denies pain    Home Living Family/patient expects to be discharged to:: Private residence Living Arrangements: Spouse/significant other Available Help at Discharge: Family Type of Home: House Home Access: Stairs to enter Entrance Stairs-Rails: None Entrance Stairs-Number of Steps: 3 steps plus 3 more steps to enter home (no railing) Home Layout: One level Home Equipment: Walker - 4 wheels      Prior Function Level of Independence: Independent with assistive device(s)         Comments: Pt reports being ambulatory shorter household distances with 4ww.     Hand Dominance        Extremity/Trunk Assessment   Upper Extremity Assessment Upper Extremity Assessment: Generalized weakness    Lower Extremity Assessment Lower Extremity Assessment: Generalized weakness(pt appearing drowsy  and difficulty with following commands for MMT)    Cervical / Trunk Assessment Cervical / Trunk Assessment: Kyphotic  Communication   Communication: No difficulties  Cognition Arousal/Alertness: (Pt appearing drowsy and needing cueing to wake up and take her medications) Behavior During Therapy: WFL for tasks assessed/performed Overall Cognitive Status: Within Functional  Limits for tasks assessed                                        General Comments   Nursing cleared pt for participation in physical therapy and present during session's activities.  Pt agreeable to PT session but reporting being tired.    Exercises  Transfer training   Assessment/Plan    PT Assessment Patient needs continued PT services  PT Problem List Decreased strength;Decreased activity tolerance;Decreased balance;Decreased mobility;Decreased knowledge of use of DME       PT Treatment Interventions DME instruction;Gait training;Stair training;Functional mobility training;Therapeutic activities;Therapeutic exercise;Balance training;Patient/family education    PT Goals (Current goals can be found in the Care Plan section)  Acute Rehab PT Goals Patient Stated Goal: to improve mobility PT Goal Formulation: With patient Time For Goal Achievement: 05/05/19 Potential to Achieve Goals: Fair    Frequency Min 2X/week   Barriers to discharge Decreased caregiver support      Co-evaluation               AM-PAC PT "6 Clicks" Mobility  Outcome Measure Help needed turning from your back to your side while in a flat bed without using bedrails?: A Little Help needed moving from lying on your back to sitting on the side of a flat bed without using bedrails?: A Lot Help needed moving to and from a bed to a chair (including a wheelchair)?: Total Help needed standing up from a chair using your arms (e.g., wheelchair or bedside chair)?: Total Help needed to walk in hospital room?: Total Help needed climbing 3-5 steps with a railing? : Total 6 Click Score: 9    End of Session Equipment Utilized During Treatment: Gait belt Activity Tolerance: Patient limited by fatigue(Limited d/t drowsiness)   Nurse Communication: Mobility status;Precautions;Other (comment)(Nurse present and aware) PT Visit Diagnosis: Other abnormalities of gait and mobility (R26.89);Muscle  weakness (generalized) (M62.81);Difficulty in walking, not elsewhere classified (R26.2)    Time: [id number] PT Time Calculation (min) (ACUTE ONLY): 26 min   Charges:   PT Evaluation $PT Eval Low Complexity: 1 Low PT Treatments $Therapeutic Activity: 8-22 mins       Leitha Bleak, PT 04/21/19, 2:03 PM

## 2019-04-21 NOTE — NC FL2 (Signed)
[place] LEVEL OF CARE SCREENING TOOL     IDENTIFICATION  Patient Name: [name] Birthdate: 1951/02/08 Sex: female Admission Date (Current Location): 04/20/2019  [place] and [place] Number:  Engineering geologist and Address:  [facility], [address], [place], [place] [address]      Provider Number: [id number]  Attending Physician Name and Address:  Lavina Hamman, MD  Relative Name and Phone Number:  Jerrye Noble [phone]    Current Level of Care: Hospital Recommended Level of Care: [place] Prior Approval Number:    Date Approved/Denied:   PASRR Number: Pending  Discharge Plan: SNF    Current Diagnoses: Patient Active Problem List   Diagnosis Date Noted  . AKI (acute kidney injury) ([place]) 04/20/2019  . Pressure injury of skin 02/03/2019  . Bilateral lower leg cellulitis 02/02/2019  . Ovarian mass, left 01/27/2019  . Sepsis ([place]) 01/05/2019  . UTI (urinary tract infection) 06/13/2018  . Altered mental status 06/11/2018  . Fall 05/13/2018  . Depression 05/13/2018  . Recurrent cellulitis of lower extremity 06/25/2017  . Medication monitoring encounter 06/05/2017  . Cellulitis of left lower leg 04/25/2017  . Adjustment disorder with mixed disturbance of emotions and conduct 04/23/2017  . Foot pain, bilateral 02/03/2017  . Tinea pedis 02/03/2017  . Left ovarian cyst 12/09/2016  . Abdominal aortic atherosclerosis ([place]) 11/11/2016  . Swelling of lower extremity 10/11/2016  . Obstructive sleep apnea 10/11/2016  . Follicular lymphoma of intra-abdominal lymph nodes ([place]) 08/06/2016  . Multiple falls 06/07/2016  . Inguinal adenopathy 05/29/2016  . Obesity, morbid ([place]) 01/05/2016  . Low HDL (under 40) 12/19/2015  . Skin ulcer ([place]) 11/08/2015  . Peripheral vascular disease of lower extremity with ulceration ([place]) 11/08/2015  . CKD (chronic kidney disease) stage 3, GFR 30-59 ml/min 11/08/2015  .  Constipation due to pain medication 01/31/2015  . Obstructive sleep apnea of adult 01/13/2015  . Pelvic muscle wasting 01/13/2015  . Incomplete bladder emptying 01/13/2015  . Headache, migraine 10/24/2014  . 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 Current tobacco use 10/24/2014  . Lumbar radiculopathy, chronic 10/02/2013  . COPD with bronchial hyperresponsiveness ([place]) 10/02/2013  . Major depressive disorder, recurrent, in partial remission ([place]) 10/02/2013  . Hypertension goal BP (blood pressure) < 140/90 10/02/2013  . Multiple sclerosis ([place]) 10/02/2013  . Absence of bladder continence 09/25/2012  . Acontractile bladder 02/12/2012  . Narrowing of intervertebral disc space 11/26/2011  . Kyphoscoliosis and scoliosis 11/26/2011    Orientation RESPIRATION BLADDER Height & Weight     Self, Time, Situation, Place  Normal Continent Weight: 131.6 kg Height:  5\' 4"  (162.6 cm)  BEHAVIORAL SYMPTOMS/MOOD NEUROLOGICAL BOWEL NUTRITION STATUS      Continent Supplemental  AMBULATORY STATUS COMMUNICATION OF NEEDS Skin   Extensive Assist   Normal                       Personal Care Assistance Level of Assistance  Bathing Bathing Assistance: Limited assistance   Dressing Assistance: Maximum assistance     Functional Limitations Info             SPECIAL CARE FACTORS FREQUENCY  PT (By licensed PT)     PT Frequency: 5 times per week              Contractures Contractures Info: Not present    Additional Factors Info  Allergies, Code Status Code Status Info: Full Allergies Info: NKDA  Current Medications (04/21/2019):  This is the current hospital active medication list Current Facility-Administered Medications  Medication Dose Route Frequency Provider Last Rate Last Admin  . acetaminophen (TYLENOL) tablet 650 mg  650 mg Oral Q6H PRN Mansy, Jan A, MD       Or  . acetaminophen (TYLENOL) suppository 650 mg  650 mg Rectal Q6H PRN Mansy, Jan A, MD      . albuterol  (VENTOLIN HFA) 108 (90 Base) MCG/ACT inhaler 1 puff  1 puff Inhalation Q4H PRN Mansy, Jan A, MD      . atorvastatin (LIPITOR) tablet 10 mg  10 mg Oral q1800 Mansy, Jan A, MD      . baclofen (LIORESAL) tablet 10 mg  10 mg Oral TID Mansy, Jan A, MD   10 mg at 04/21/19 1027  . buPROPion (WELLBUTRIN XL) 24 hr tablet 300 mg  300 mg Oral Daily Mansy, Jan A, MD   300 mg at 04/21/19 1025  . Chlorhexidine Gluconate Cloth 2 % PADS 6 each  6 each Topical Daily Lavina Hamman, MD      . clonazePAM [facility] [place]) tablet 0.5 mg  0.5 mg Oral QHS Mansy, Jan A, MD   0.5 mg at 04/20/19 2341  . docusate sodium (COLACE) capsule 100 mg  100 mg Oral BID PRN Mansy, Jan A, MD      . DULoxetine (CYMBALTA) DR capsule 60 mg  60 mg Oral [facility], Jan A, MD   60 mg at 04/21/19 0719  . gabapentin (NEURONTIN) tablet 600 mg  600 mg Oral TID Mansy, Jan A, MD   600 mg at 04/21/19 1025  . heparin injection 5,000 Units  5,000 Units Subcutaneous Q8H Hart Robinsons A, RPH   5,000 Units at 04/21/19 [id number]  . lactated ringers infusion   Intravenous Continuous Lavina Hamman, MD 100 mL/hr at 04/21/19 1133 New Bag at 04/21/19 1133  . mirabegron ER (MYRBETRIQ) tablet 50 mg  50 mg Oral Daily Mansy, Jan A, MD   50 mg at 04/21/19 1027  . mometasone-formoterol (DULERA) 200-5 MCG/ACT inhaler 2 puff  2 puff Inhalation BID Mansy, Jan A, MD   2 puff at 04/21/19 1026  . multivitamin with minerals tablet 1 tablet  1 tablet Oral QPM Mansy, Arvella Merles, MD   1 tablet at 04/20/19 2343  . ondansetron (ZOFRAN) tablet 4 mg  4 mg Oral Q6H PRN Mansy, Jan A, MD       Or  . ondansetron [facility]) injection 4 mg  4 mg Intravenous Q6H PRN Mansy, Jan A, MD      . polyethylene glycol (MIRALAX / GLYCOLAX) packet 17 g  17 g Oral Daily PRN Mansy, Jan A, MD      . QUEtiapine (SEROQUEL XR) 24 hr tablet 150 mg  150 mg Oral QHS Mansy, Jan A, MD   150 mg at 04/21/19 0318  . sodium chloride flush (NS) 0.9 % injection 3 mL  3 mL Intravenous Once Blake Divine, MD      .  Teriflunomide TABS 14 mg  14 mg Oral Daily Mansy, Jan A, MD      . tiZANidine (ZANAFLEX) tablet 4 mg  4 mg Oral TID Mansy, Jan A, MD   4 mg at 04/21/19 1027  . traZODone (DESYREL) tablet 25 mg  25 mg Oral QHS PRN Mansy, Arvella Merles, MD       Facility-Administered Medications Ordered in Other Encounters  Medication Dose Route Frequency Provider Last Rate Last Admin  .  heparin lock flush 100 unit/mL  500 Units Intracatheter Once PRN Sindy Guadeloupe, MD         Discharge Medications: Please see discharge summary for a list of discharge medications.  Relevant Imaging Results:  Relevant Lab Results:   Additional Information [id number]  Su Hilt, RN

## 2019-04-21 NOTE — Consult Note (Signed)
Reason for Consult: lower extremity weakness  Referring Physician: Dr. Posey Pronto   CC: Lower extremity weakness   HPI: [name] is an 68 y.o. female  with a known history of hypertension, B-cell lymphoma, multiple sclerosis seen by Dr. Manuella Ghazi, COPD, urinary retention with chronic indwelling Foley catheter, depression, anxiety and ADHD who presented to the emergency room with  generalized weakness over the last couple of days to a week.  She stated that she has been feeling so weak she could not pick her feet up to the chair.  She usually ambulates with a walker and could not do that the last couple of days.  No fever or chills.  No nausea or vomiting or abdominal pain.  No headache or dizziness or blurred vision no worsening weakness or paresthesias. Pt is found to have AKI and suspected UTI. Pt does receive weekly Avonex injections but missed last one.   Past Medical History:  Diagnosis Date  . Abdominal aortic atherosclerosis ([place]) 11/11/2016  . ADHD   . Anxiety   . COPD (chronic obstructive pulmonary disease) ([place])   . Depression    major depressive  . Dyspnea    doe  . Edema    left leg  . Follicular lymphoma ([place])    B Cell  . Follicular lymphoma grade II ([place])   . Hypertension   . Hypotension    idiopathic  . Kyphoscoliosis and scoliosis 11/26/2011  . Morbid obesity ([place]) 01/05/2016  . Multiple sclerosis ([place])   . Multiple sclerosis ([place])    1980's  . Neuromuscular disorder ([place])   . Obstructive and reflux uropathy    foley  . Pain    atypical facial  . Peripheral vascular disease of lower extremity with ulceration ([place]) 11/08/2015  . Skin ulcer ([place]) 11/08/2015  . Weakness    generalized. has MS    Past Surgical History:  Procedure Laterality Date  . BACK SURGERY N/A 2002  . CYST EXCISION     lower back  . INGUINAL LYMPH NODE BIOPSY Left 07/04/2016   Procedure: INGUINAL LYMPH NODE BIOPSY;  Surgeon: Christene Lye, MD;  Location: [facility] ORS;  Service: General;   Laterality: Left;  . PORTACATH PLACEMENT N/A 07/22/2016   Procedure: INSERTION PORT-A-CATH;  Surgeon: Christene Lye, MD;  Location: [facility] ORS;  Service: General;  Laterality: N/A;  . TONSILLECTOMY AND ADENOIDECTOMY    . TUBAL LIGATION      Family History  Problem Relation Age of Onset  . COPD Mother   . Diabetes Mother   . Heart failure Mother   . Alcohol abuse Father   . Kidney disease Father   . Kidney failure Father   . Arthritis Sister   . CAD Maternal Grandmother   . Stroke Maternal Grandfather   . Arthritis Sister   . Mental illness Sister   . Arthritis Brother     Social History:  reports that she quit smoking about 3 years ago. Her smoking use included cigarettes. She started smoking about 23 years ago. She has a 20.00 pack-year smoking history. She has never used smokeless tobacco. She reports current drug use. Drug: Marijuana. She reports that she does not drink alcohol.  No Known Allergies  Medications: I have reviewed the patient's current medications.  ROS: History obtained from the patient  General ROS: negative for - chills, fatigue, fever, night sweats, weight gain or weight loss Psychological ROS: negative for - behavioral disorder, hallucinations, memory difficulties, mood swings or suicidal ideation  Ophthalmic ROS: negative for - blurry vision, double vision, eye pain or loss of vision ENT ROS: negative for - epistaxis, nasal discharge, oral lesions, sore throat, tinnitus or vertigo Allergy and Immunology ROS: negative for - hives or itchy/watery eyes Hematological and Lymphatic ROS: negative for - bleeding problems, bruising or swollen lymph nodes Endocrine ROS: negative for - galactorrhea, hair pattern changes, polydipsia/polyuria or temperature intolerance Respiratory ROS: negative for - cough, hemoptysis, shortness of breath or wheezing Cardiovascular ROS: negative for - chest pain, dyspnea on exertion, edema or irregular  heartbeat Gastrointestinal ROS: negative for - abdominal pain, diarrhea, hematemesis, nausea/vomiting or stool incontinence Genito-Urinary ROS: negative for - dysuria, hematuria, incontinence or urinary frequency/urgency Musculoskeletal ROS: negative for - joint swelling or muscular weakness Neurological ROS: as noted in HPI Dermatological ROS: negative for rash and skin lesion changes  Physical Examination: Blood pressure (!) 106/40, pulse 74, temperature 98.7 F (37.1 C), temperature source Oral, resp. rate 17, height 5\' 4"  (1.626 m), weight 131.6 kg, SpO2 96 %.   Neurological Examination   Mental Status: Alert, oriented, thought content appropriate.  Speech fluent without evidence of aphasia.  Able to follow 3 step commands without difficulty. Cranial Nerves: II: Discs flat bilaterally; Visual fields grossly normal, pupils equal, round, reactive to light and accommodation III,IV, VI: ptosis not present, extra-ocular motions intact bilaterally V,VII: smile symmetric, facial light touch sensation normal bilaterally VIII: hearing normal bilaterally IX,X: gag reflex present XI: bilateral shoulder shrug XII: midline tongue extension Motor: Right : Upper extremity   5/5    Left:     Upper extremity   5/5  Lower extremity   4/5     Lower extremity   4/5 Tone and bulk:normal tone throughout; no atrophy noted Sensory: Pinprick and light touch intact throughout, bilaterally    Laboratory Studies:   Basic Metabolic Panel: Recent Labs  Lab 04/20/19 1443 04/21/19 0546  NA 131* 131*  K 4.1 3.8  CL 96* 100  CO2 21* 17*  GLUCOSE 99 77  BUN 47* 44*  CREATININE 3.32* 2.86*  CALCIUM 8.5* 8.1*    Liver Function Tests: No results for input(s): AST, ALT, ALKPHOS, BILITOT, PROT, ALBUMIN in the last 168 hours. No results for input(s): LIPASE, AMYLASE in the last 168 hours. No results for input(s): AMMONIA in the last 168 hours.  CBC: Recent Labs  Lab 04/20/19 1443 04/21/19 0546   WBC 9.6 7.0  HGB 10.2* 9.2*  HCT 30.9* 27.2*  MCV 82.2 81.0  PLT 175 166    Cardiac Enzymes: No results for input(s): CKTOTAL, CKMB, CKMBINDEX, TROPONINI in the last 168 hours.  BNP: Invalid input(s): POCBNP  CBG: Recent Labs  Lab 04/21/19 1016  GLUCAP 73    Microbiology: Results for orders placed or performed during the hospital encounter of 04/20/19  SARS CORONAVIRUS 2 (TAT 6-24 HRS) Nasopharyngeal Nasopharyngeal Swab     Status: None   Collection Time: 04/20/19  7:58 PM   Specimen: Nasopharyngeal Swab  Result Value Ref Range Status   SARS Coronavirus 2 NEGATIVE NEGATIVE Final    Comment: (NOTE) SARS-CoV-2 target nucleic acids are NOT DETECTED. The SARS-CoV-2 RNA is generally detectable in upper and lower respiratory specimens during the acute phase of infection. Negative results do not preclude SARS-CoV-2 infection, do not rule out co-infections with other pathogens, and should not be used as the sole basis for treatment or other patient management decisions. Negative results must be combined with clinical observations, patient history, and epidemiological information. The expected  result is Negative. Fact Sheet for Patients: SugarRoll.be Fact Sheet for Healthcare Providers: https://www.woods-mathews.com/ This test is not yet approved or cleared by the [place] FDA and  has been authorized for detection and/or diagnosis of SARS-CoV-2 by FDA under an Emergency Use Authorization (EUA). This EUA will remain  in effect (meaning this test can be used) for the duration of the COVID-19 declaration under Section 56 4(b)(1) of the Act, 21 U.S.C. section 360bbb-3(b)(1), unless the authorization is terminated or revoked sooner. Performed at [facility], [place] [address], [place], [place] [address]     Coagulation Studies: No results for input(s): LABPROT, INR in the last 72 hours.  Urinalysis:  Recent Labs  Lab  04/20/19 1443 04/20/19 2037  COLORURINE AMBER* YELLOW*  LABSPEC 1.018 1.017  PHURINE 5.0 5.0  GLUCOSEU NEGATIVE NEGATIVE  HGBUR MODERATE* SMALL*  BILIRUBINUR NEGATIVE NEGATIVE  KETONESUR NEGATIVE NEGATIVE  PROTEINUR 100* 100*  NITRITE NEGATIVE NEGATIVE  LEUKOCYTESUR LARGE* LARGE*    Lipid Panel:     Component Value Date/Time   CHOL 155 02/02/2018 1613   TRIG 147 02/02/2018 1613   HDL 35 (L) 02/02/2018 1613   CHOLHDL 4.4 02/02/2018 1613   VLDL 28 11/08/2015 1307   LDLCALC 96 02/02/2018 1613    HgbA1C:  Lab Results  Component Value Date   HGBA1C 5.6 01/04/2019    Urine Drug Screen:      Component Value Date/Time   LABOPIA NONE DETECTED 07/04/2016 1048   COCAINSCRNUR NONE DETECTED 07/04/2016 1048   LABBENZ NONE DETECTED 07/04/2016 1048   AMPHETMU NONE DETECTED 07/04/2016 1048   THCU NONE DETECTED 07/04/2016 1048   LABBARB NONE DETECTED 07/04/2016 1048    Alcohol Level: No results for input(s): ETH in the last 168 hours.  Other results: EKG: normal EKG, normal sinus rhythm, unchanged from previous tracings.  Imaging: US RENAL  Result Date: 04/21/2019 CLINICAL DATA:  Acute renal injury. EXAM: RENAL / URINARY TRACT ULTRASOUND COMPLETE COMPARISON:  CT report 05/06/2018. FINDINGS: Right Kidney: Renal measurements: 10.1 x 4.9 x 5.5 cm = volume: 141 mL . Echogenicity within normal limits. 2.2 cm simple cyst. Hydronephrosis visualized. Left Kidney: Renal measurements: 10.2 x 5.8 x 4.8 cm = volume: 148 mL. Echogenicity within normal limits. No mass. Mild hydronephrosis. Bladder: Patient has a Foley catheter.  Bladder nondistended. Other: None. IMPRESSION: 1.  Mild left hydronephrosis. 2.  2.2 cm simple cyst right kidney. Electronically Signed   By: Marcello Moores  Register   On: 04/21/2019 09:27   DG Chest Portable 1 View  Result Date: 04/20/2019 CLINICAL DATA:  Weakness for 3 days recurrent UTI an indwelling Foley catheter EXAM: PORTABLE CHEST 1 VIEW COMPARISON:  01/04/2019  FINDINGS: Cardiomediastinal contours are normal. Lungs are clear. No signs of pleural effusion. No acute bone process. IMPRESSION: No acute cardiopulmonary disease. Electronically Signed   By: Zetta Bills M.D.   On: 04/20/2019 20:15     Assessment/Plan:   68 y.o. female  with a known history of hypertension, B-cell lymphoma, multiple sclerosis seen by Dr. Manuella Ghazi, COPD, urinary retention with chronic indwelling Foley catheter, depression, anxiety and ADHD who presented to the emergency room with  generalized weakness over the last couple of days to a week.  She stated that she has been feeling so weak she could not pick her feet up to the chair.  She usually ambulates with a walker and could not do that the last couple of days.  No fever or chills.  No nausea or vomiting  or abdominal pain.  No headache or dizziness or blurred vision no worsening weakness or paresthesias. Pt is found to have AKI and suspected UTI. Pt does receive weekly Avonex injections but missed last one.   - Patient will ask her husband to bring in the Avonex which we will administer in the hospital - Not convinced MS exacerbation and could be provoked by AKI. Pt does have chronic foley and is usually Leukocyte positive and many WBC and bacteria - Would hold off steroids as not convinced this is MS exacerbation - pt/ot 04/21/2019, 11:26 AM

## 2019-04-21 NOTE — Progress Notes (Signed)
[facility] Progress Note  Patient: [name] [id number]   PCP: Kirk Ruths, MD DOB: [birth year]   DOA: 04/20/2019   DOS: 04/21/2019   Date of Service: the patient was seen and examined on 04/21/2019  Chief Complaint  Patient presents with  . Weakness     Brief hospital course: [name]  is a 68 y.o. Caucasian female with a known history of hypertension, B-cell lymphoma, multiple sclerosis, COPD, urinary retention with chronic indwelling Foley catheter, depression, anxiety and ADHD who presented to the emergency room with acute onset of generalized weakness over the last couple of days.  She stated that she has been feeling so weak she could not pick her feet up to the chair.  She usually ambulates with a walker and could not do that the last couple of days.  No fever or chills.  No nausea or vomiting or abdominal pain.  No headache or dizziness or blurred vision no worsening weakness or paresthesias.  She stated that her urine has been smelling horrible.  She has been having significant diminished appetite.  No recent exposure to COVID-19.  Currently further plan is monitor improvement in neurological status.  Subjective: No acute complaint no nausea no vomiting no fever no chills.  Assessment and Plan: Scheduled Meds: . atorvastatin  10 mg Oral q1800  . baclofen  10 mg Oral TID  . buPROPion  300 mg Oral Daily  . Chlorhexidine Gluconate Cloth  6 each Topical Daily  . clonazePAM  0.5 mg Oral QHS  . DULoxetine  60 mg Oral BH-q7a  . gabapentin  600 mg Oral TID  . heparin injection (subcutaneous)  5,000 Units Subcutaneous Q8H  . mirabegron ER  50 mg Oral Daily  . mometasone-formoterol  2 puff Inhalation BID  . multivitamin with minerals  1 tablet Oral QPM  . QUEtiapine Fumarate  150 mg Oral QHS  . sodium chloride flush  3 mL Intravenous Once  . Teriflunomide  14 mg Oral Daily  . tiZANidine  4 mg Oral TID   Continuous Infusions: . lactated ringers 100 mL/hr at  04/21/19 1133   PRN Meds: acetaminophen **OR** acetaminophen, albuterol, docusate sodium, ondansetron **OR** ondansetron (ZOFRAN) IV, polyethylene glycol, traZODone  1.  Acute kidney injury superimposed on stage IIIa chronic kidney disease.  This is likely prerenal secondary to volume depletion and dehydration.   Continue IV fluid. Avoid nephrotoxic medication. Hold off on lisinopril.  2.  Possible UTI in the setting of chronic indwelling Foley cath.   Suspect this is asymptomatic bacteriuria. We will monitor for now.  No antibiotics.  3.  Generalized weakness likely secondary to #1 and #2.  Management as above.  Physical therapy consult will be obtained.  4.  COPD.  No current exacerbation.  We will continue her Symbicort and albuterol MDI.  5.  Hypertension.  We will continue her Norvasc, hydralazine and hold off lisinopril given acute kidney injury.  6.  Anxiety and depression.  We will continue her Wellbutrin XL, Cymbalta and Klonopin.  7.  Dyslipidemia.  We will continue statin therapy.  8.  Multiple sclerosis.  We will continue her baclofen and Zanaflex.  She gets weekly Avonex on Mondays.  I do not believe her generalized weakness is a flare of her multiple sclerosis.   Appreciate neurological assistance.  9.  Obesity Body mass index is 49.8 kg/m.  Patient will benefit from outpatient dietary consultation.  10 pressure ulcer left heel DTI POA. Foam dressing.  Pressure  Injury 04/21/19 Heel Left Deep Tissue Pressure Injury - Purple or maroon localized area of discolored intact skin or blood-filled blister due to damage of underlying soft tissue from pressure and/or shear. Purple blister. Heels on pillows (Active)  04/21/19 0100  Location: Heel  Location Orientation: Left  Staging: Deep Tissue Pressure Injury - Purple or maroon localized area of discolored intact skin or blood-filled blister due to damage of underlying soft tissue from pressure and/or shear.  Wound  Description (Comments): Purple blister. Heels on pillows  Present on Admission: Yes     Diet: Cardiac diet  DVT Prophylaxis: Subcutaneous Heparin    Advance goals of care discussion: Full code  Family Communication: NO family was present at bedside, at the time of interview.  Disposition:  Discharge to be determined.  Consultants: Neurology Procedures: none  Antibiotics: Anti-infectives (From admission, onward)   None       Objective: Physical Exam: Vitals:   04/21/19 0043 04/21/19 0458 04/21/19 0735 04/21/19 1627  BP: 139/62  (!) 106/40 (!) 109/58  Pulse: 87  74 73  Resp: 17     Temp: 98.8 F (37.1 C)  98.7 F (37.1 C) 98.9 F (37.2 C)  TempSrc: Oral  Oral Oral  SpO2: 95%  96% 100%  Weight:  131.6 kg    Height:  5\' 4"  (1.626 m)      Intake/Output Summary (Last 24 hours) at 04/21/2019 2008 Last data filed at 04/21/2019 1900 Gross per 24 hour  Intake 1190 ml  Output 1980 ml  Net -790 ml   Filed Weights   04/20/19 1434 04/21/19 0458  Weight: 123.8 kg 131.6 kg   General: alert and oriented to time, place, and person. Appear in mild distress, affect appropriate Eyes: PERRL, Conjunctiva normal ENT: Oral Mucosa Clear, moist  Neck: no JVD, no Abnormal Mass Or lumps Cardiovascular: S1 and S2 Present, no Murmur,  Respiratory: good respiratory effort, Bilateral Air entry equal and Decreased, no signs of accessory muscle use, Clear to Auscultation, no Crackles, no wheezes Abdomen: Bowel Sound present, Soft and no tenderness, no hernia Skin: no rashes  Extremities: no Pedal edema, no calf tenderness Neurologic: without any new focal findings Gait not checked due to patient safety concerns  Data Reviewed: I have personally reviewed and interpreted daily labs, tele strips, imagings as discussed above. I reviewed all nursing notes, pharmacy notes, vitals, pertinent old records I have discussed plan of care as described above with RN and patient/family.  CBC: Recent  Labs  Lab 04/20/19 1443 04/21/19 0546  WBC 9.6 7.0  HGB 10.2* 9.2*  HCT 30.9* 27.2*  MCV 82.2 81.0  PLT 175 [id number]   Basic Metabolic Panel: Recent Labs  Lab 04/20/19 1443 04/21/19 0546  NA 131* 131*  K 4.1 3.8  CL 96* 100  CO2 21* 17*  GLUCOSE 99 77  BUN 47* 44*  CREATININE 3.32* 2.86*  CALCIUM 8.5* 8.1*    Liver Function Tests: No results for input(s): AST, ALT, ALKPHOS, BILITOT, PROT, ALBUMIN in the last 168 hours. No results for input(s): LIPASE, AMYLASE in the last 168 hours. No results for input(s): AMMONIA in the last 168 hours. Coagulation Profile: No results for input(s): INR, PROTIME in the last 168 hours. Cardiac Enzymes: No results for input(s): CKTOTAL, CKMB, CKMBINDEX, TROPONINI in the last 168 hours. BNP (last 3 results) No results for input(s): PROBNP in the last 8760 hours. CBG: Recent Labs  Lab 04/21/19 1016  GLUCAP 73   Studies: US RENAL  Result Date: 04/21/2019 CLINICAL DATA:  Acute renal injury. EXAM: RENAL / URINARY TRACT ULTRASOUND COMPLETE COMPARISON:  CT report 05/06/2018. FINDINGS: Right Kidney: Renal measurements: 10.1 x 4.9 x 5.5 cm = volume: 141 mL . Echogenicity within normal limits. 2.2 cm simple cyst. Hydronephrosis visualized. Left Kidney: Renal measurements: 10.2 x 5.8 x 4.8 cm = volume: 148 mL. Echogenicity within normal limits. No mass. Mild hydronephrosis. Bladder: Patient has a Foley catheter.  Bladder nondistended. Other: None. IMPRESSION: 1.  Mild left hydronephrosis. 2.  2.2 cm simple cyst right kidney. Electronically Signed   By: Marcello Moores  Register   On: 04/21/2019 09:27     Time spent: 35 minutes  Author: Berle Mull, MD [facility] 04/21/2019 8:08 PM  To reach On-call, see care teams to locate the attending and reach out to them via [email or web address]. If 7PM-7AM, please contact night-coverage If you still have difficulty reaching the attending provider, please page the [facility] (Director on Call) for [facility] on  amion for assistance.

## 2019-04-22 LAB — COMPREHENSIVE METABOLIC PANEL
ALT: 31 U/L (ref 0–44)
AST: 61 U/L — ABNORMAL HIGH (ref 15–41)
Albumin: 3.1 g/dL — ABNORMAL LOW (ref 3.5–5.0)
Alkaline Phosphatase: 62 U/L (ref 38–126)
Anion gap: 10 (ref 5–15)
BUN: 32 mg/dL — ABNORMAL HIGH (ref 8–23)
CO2: 22 mmol/L (ref 22–32)
Calcium: 8.3 mg/dL — ABNORMAL LOW (ref 8.9–10.3)
Chloride: 106 mmol/L (ref 98–111)
Creatinine, Ser: 1.47 mg/dL — ABNORMAL HIGH (ref 0.44–1.00)
GFR calc Af Amer: 42 mL/min — ABNORMAL LOW (ref >60)
GFR calc non Af Amer: 36 mL/min — ABNORMAL LOW (ref >60)
Glucose, Bld: 89 mg/dL (ref 70–99)
Potassium: 4.2 mmol/L (ref 3.5–5.1)
Sodium: 138 mmol/L (ref 135–145)
Total Bilirubin: 0.4 mg/dL (ref 0.3–1.2)
Total Protein: 6.1 g/dL — ABNORMAL LOW (ref 6.5–8.1)

## 2019-04-22 LAB — CBC WITH DIFFERENTIAL/PLATELET
Abs Immature Granulocytes: 0.07 10*3/uL (ref 0.00–0.07)
Basophils Absolute: 0.1 10*3/uL (ref 0.0–0.1)
Basophils Relative: 1 %
Eosinophils Absolute: 0.3 10*3/uL (ref 0.0–0.5)
Eosinophils Relative: 4 %
HCT: 28.1 % — ABNORMAL LOW (ref 36.0–46.0)
Hemoglobin: 9 g/dL — ABNORMAL LOW (ref 12.0–15.0)
Immature Granulocytes: 1 %
Lymphocytes Relative: 10 %
Lymphs Abs: 0.7 10*3/uL (ref 0.7–4.0)
MCH: 27.2 pg (ref 26.0–34.0)
MCHC: 32 g/dL (ref 30.0–36.0)
MCV: 84.9 fL (ref 80.0–100.0)
Monocytes Absolute: 0.6 10*3/uL (ref 0.1–1.0)
Monocytes Relative: 10 %
Neutro Abs: 5 10*3/uL (ref 1.7–7.7)
Neutrophils Relative %: 74 %
Platelets: 206 10*3/uL (ref 150–400)
RBC: 3.31 MIL/uL — ABNORMAL LOW (ref 3.87–5.11)
RDW: 14.2 % (ref 11.5–15.5)
WBC: 6.7 10*3/uL (ref 4.0–10.5)
nRBC: 0 % (ref 0.0–0.2)

## 2019-04-22 LAB — MAGNESIUM: Magnesium: 1.9 mg/dL (ref 1.7–2.4)

## 2019-04-22 MED ORDER — CLONAZEPAM 1 MG PO TABS: 0.5000 mg | ORAL_TABLET | Freq: Every day | ORAL | 0 refills | Status: DC

## 2019-04-22 MED ORDER — POLYETHYLENE GLYCOL 3350 17 G PO PACK: 17.0000 g | PACK | Freq: Every day | ORAL | 0 refills | Status: DC | PRN

## 2019-04-22 NOTE — Progress Notes (Signed)
Notified pharmacy that husband did not bring avonex or Teriflunomide.  Pt will be here until at least monday

## 2019-04-22 NOTE — Progress Notes (Signed)
Physical Therapy Treatment Patient Details Name: [name] MRN: [id number] DOB: 08/05/1950 Today's Date: 04/22/2019    History of Present Illness Pt is a 68 y.o. female presenting to hospital 04/20/19 with generalized weakness.  Pt admitted with AKI superimposed on stage IVa CKD, possible UTI in setting of chronic indwelling foley cath, and generalized weakness.  PMH includes ADHD, anxiety, kyphoscoliosis, PVD, L LE cellulitis, MS, htn, COPD, urinary retention with chronic foley, portacath, back surgery, B-cell lymphoma.    PT Comments    Pt agrees to session but voices frustration over discharge to rehab "I don't want to go."  While pt is resistant she knows home is not an option with her mobility level.  She also stated she does not want to return home "My husband is not a nice person."  Encouragement given.  She does not give further details at this time.  Bed mobility with mod a x 2 for rolling for bed change as she spilled her coffee.  Techs in to help.  No burns noted.  She is able to  Complete HEP with encouragement.  During session, noted large pressure blister L heel.  Pt stated it was painful and care was given to prevent shearing forces with mobility and ex. Heels floated and Charge RN Raquel Sarna notified as Primary nurse was occupied.  Pt would benefit from pressure relieving boots in bed.   Follow Up Recommendations  SNF     Equipment Recommendations  Rolling walker with 5" wheels;3in1 (PT);Wheelchair (measurements PT);Wheelchair cushion (measurements PT)    Recommendations for Other Services       Precautions / Restrictions Precautions Precautions: Fall Precaution Comments: foley catheter Restrictions Weight Bearing Restrictions: No    Mobility  Bed Mobility Overal bed mobility: Needs Assistance Bed Mobility: Rolling Rolling: Mod assist;+2 for physical assistance            Transfers                    Ambulation/Gait                 Stairs              Wheelchair Mobility    Modified Rankin (Stroke Patients Only)       Balance Overall balance assessment: Needs assistance                                          Cognition Arousal/Alertness: Awake/alert Behavior During Therapy: WFL for tasks assessed/performed Overall Cognitive Status: Within Functional Limits for tasks assessed                                        Exercises Other Exercises Other Exercises: supine AAROM 2 x 10 ankle pumps, heel slides, ab/add heel slides with encouragement    General Comments        Pertinent Vitals/Pain Pain Assessment: No/denies pain    Home Living                      Prior Function            PT Goals (current goals can now be found in the care plan section) Progress towards PT goals: Progressing toward goals    Frequency    Min 2X/week  PT Plan Current plan remains appropriate    Co-evaluation              AM-PAC PT "6 Clicks" Mobility   Outcome Measure  Help needed turning from your back to your side while in a flat bed without using bedrails?: A Lot Help needed moving from lying on your back to sitting on the side of a flat bed without using bedrails?: A Lot Help needed moving to and from a bed to a chair (including a wheelchair)?: Total Help needed standing up from a chair using your arms (e.g., wheelchair or bedside chair)?: Total Help needed to walk in hospital room?: Total Help needed climbing 3-5 steps with a railing? : Total 6 Click Score: 8    End of Session   Activity Tolerance: Patient tolerated treatment well;Other (comment) Patient left: in bed;with call bell/phone within reach;with bed alarm set Nurse Communication: Mobility status;Precautions;Other (comment)       Time: [id number] PT Time Calculation (min) (ACUTE ONLY): 12 min  Charges:  $Therapeutic Exercise: 8-22 mins                     Chesley Noon,  PTA 04/22/19, 11:18 AM

## 2019-04-22 NOTE — Discharge Summary (Signed)
[facility] Discharge Summary   Patient: [name] [id number]   PCP: Kirk Ruths, MD DOB: 12-19-1950   Date of admission: 04/20/2019   Date of discharge:  04/22/2019    Discharge Diagnoses:  Principal diagnosis Acute kidney injury superimposed on stage IIIa chronic kidney disease.  prerenal secondary to volume depletion and dehydration. Active Problems:   AKI (acute kidney injury) ([place])  Admitted From: home Disposition:  SNF   Recommendations for Outpatient Follow-up:  1. PCP: please follow up in 1 week 2. Follow up LABS/TEST:  BMP in 1 week  Contact information for after-discharge care    [facility] .   Service: Skilled Nursing Contact information: [name] [phone]             Diet recommendation: Cardiac diet  Activity: The patient is advised to gradually reintroduce usual activities,as tolerated  Discharge Condition: good  Code Status: Full code   History of present illness: As per the H and P dictated on admission, "[name]  is a 68 y.o. Caucasian female with a known history of hypertension, B-cell lymphoma, multiple sclerosis, COPD, urinary retention with chronic indwelling Foley catheter, depression, anxiety and ADHD who presented to the emergency room with acute onset of generalized weakness over the last couple of days.  She stated that she has been feeling so weak she could not pick her feet up to the chair.  She usually ambulates with a walker and could not do that the last couple of days.  No fever or chills.  No nausea or vomiting or abdominal pain.  No headache or dizziness or blurred vision no worsening weakness or paresthesias.  She stated that her urine has been smelling horrible.  She has been having significant diminished appetite.  No recent exposure to COVID-19.  Upon presentation to the emergency room, vital signs were within normal and  later blood pressure was 134/54.  Labs revealed anemia slightly worse than previous levels last month with hemoglobin of 10.2 and hematocrit 30.9 compared to 11.2/35.3.  Lactic acid was 1.4.  UA came back with more than 50 WBCs and many bacteria with 21-50 RBCs.  Repeat UA also showed more than 50 WBCs with 6-10 RBCs and WBC clumps were positive in both samples and protein was 100 in both with negative nitrite and large leukocytes.  BMP was remarkable for hyponatremia 131 and hypochloremia 96 compared to normal levels on 03/08/2019, BUN of 47 compared to 24 and creatinine of 3.32 compared to 1.38 then with baseline stage IIIa chronic kidney disease.  The patient was given 1 L bolus of IV lactated Ringer.  She will be admitted to a medical bed for further evaluation and management"  Hospital Course:  Summary of her active problems in the hospital is as following. 1. Acute kidney injury superimposed on stage IIIa chronic kidney disease.  This is likely prerenal secondary to volume depletion and dehydration.  Treated with IV fluid. Avoid nephrotoxic medication. Hold off on lisinopril.  2. asymptomatic bacteriuria in the setting of chronic indwelling Foley cath. Suspect this is asymptomatic bacteriuria. We will monitor for now.  No antibiotics.  3. Generalized weakness likely secondary to #1 and #2. Management as above. Physical therapy consult will be obtained.  4. COPD.  No current exacerbation.  We will continue her Symbicort and albuterol MDI.  5. Hypertension. We will hold her Norvasc, hydralazine and hold off lisinopril given acute  kidney injury and normal blood pressure  6. Anxiety and depression.  We will continue her Wellbutrin XL, Cymbalta and Klonopin.  7. Dyslipidemia.  We will continue statin therapy.  8. Multiple sclerosis.  We will continue her baclofen and Zanaflex. She gets weekly Avonex on Mondays.I do not believe her generalized weakness  is a flare of her multiple sclerosis.  Appreciate neurological assistance.  9.  Obesity Body mass index is 49.8 kg/m.  Patient will benefit from outpatient dietary consultation.  10 pressure ulcer left heel DTI POA. Foam dressing.     Pressure Injury 04/21/19 Heel Left Deep Tissue Pressure Injury - Purple or maroon localized area of discolored intact skin or blood-filled blister due to damage of underlying soft tissue from pressure and/or shear. Purple blister. Heels on pillows (Active)  04/21/19 0100  Location: Heel  Location Orientation: Left  Staging: Deep Tissue Pressure Injury - Purple or maroon localized area of discolored intact skin or blood-filled blister due to damage of underlying soft tissue from pressure and/or shear.  Wound Description (Comments): Purple blister. Heels on pillows  Present on Admission: Yes    Patient was seen by physical therapy, who recommended SNF, which was arranged. On the day of the discharge the patient's vitals were stable, and no other acute medical condition were reported by patient. the patient was felt safe to be discharge at SNF with SNF.  Consultants: neurology Procedures: none  DISCHARGE MEDICATION: Allergies as of 04/22/2019   No Known Allergies     Medication List    STOP taking these medications   amLODipine 5 MG tablet Commonly known as: NORVASC   lisinopril 20 MG tablet Commonly known as: ZESTRIL     TAKE these medications   acetaminophen 325 MG tablet Commonly known as: TYLENOL Take 2 tablets (650 mg total) by mouth every 6 (six) hours as needed for mild pain (or Fever >/= 101).   albuterol 108 (90 Base) MCG/ACT inhaler Commonly known as: VENTOLIN HFA Inhale 1 puff into the lungs every 4 (four) hours as needed for shortness of breath.   atorvastatin 10 MG tablet Commonly known as: LIPITOR Take 10 mg by mouth daily.   Aubagio 14 MG Tabs Generic drug: Teriflunomide Take 14 mg by mouth daily.   baclofen 10 MG  tablet Commonly known as: LIORESAL Take 10 mg by mouth 3 (three) times daily.   buPROPion 300 MG 24 hr tablet Commonly known as: WELLBUTRIN XL Take 300 mg by mouth daily.   clonazePAM 1 MG tablet Commonly known as: KLONOPIN Take 0.5 tablets (0.5 mg total) by mouth at bedtime.   docusate sodium 100 MG capsule Commonly known as: COLACE Take 1 capsule (100 mg total) by mouth 2 (two) times daily. What changed:   when to take this  reasons to take this   DULoxetine 60 MG capsule Commonly known as: CYMBALTA Take 1 capsule (60 mg total) by mouth every morning.   gabapentin 600 MG tablet Commonly known as: NEURONTIN Take 1 tablet (600 mg total) by mouth 3 (three) times daily.   hydrALAZINE 50 MG tablet Commonly known as: APRESOLINE Take 50 mg by mouth 3 (three) times daily.   interferon beta-1a 30 MCG/0.5ML Pskt injection Commonly known as: AVONEX Inject 30 mcg into the muscle every Monday.   multivitamin with minerals Tabs tablet Take 1 tablet by mouth every evening.   Myrbetriq 50 MG Tb24 tablet Generic drug: mirabegron ER TAKE ONE TABLET BY MOUTH ONCE DAILY What changed:   how  much to take  how to take this  when to take this   polyethylene glycol 17 g packet Commonly known as: MIRALAX / GLYCOLAX Take 17 g by mouth daily as needed for mild constipation.   potassium chloride 10 MEQ tablet Commonly known as: KLOR-CON Take 10 mEq by mouth daily.   QUEtiapine Fumarate 150 MG 24 hr tablet Commonly known as: SEROQUEL XR Take 150 mg by mouth at bedtime.   Symbicort 160-4.5 MCG/ACT inhaler Generic drug: budesonide-formoterol Inhale 2 puffs into the lungs 2 (two) times daily.   tiZANidine 4 MG tablet Commonly known as: ZANAFLEX Take 4 mg by mouth 3 (three) times daily.   vitamin C 500 MG tablet Commonly known as: ASCORBIC ACID Take 500 mg by mouth every evening.      No Known Allergies Discharge Instructions    Diet - low sodium heart healthy    Complete by: As directed    Increase activity slowly   Complete by: As directed      Discharge Exam: Filed Weights   04/20/19 1434 04/21/19 0458  Weight: 123.8 kg 131.6 kg   Vitals:   04/21/19 2338 04/22/19 0729  BP: (!) 125/56 (!) 117/59  Pulse: 77 80  Resp: 20 20  Temp: 98.5 F (36.9 C) 98.6 F (37 C)  SpO2: 97% 96%   General: Appear in no distress, no Rash; Oral Mucosa Clear, moist. no Abnormal Mass Or lumps Cardiovascular: S1 and S2 Present, no Murmur, Respiratory: normal respiratory effort, Bilateral Air entry present and Clear to Auscultation, no Crackles, no wheezes Abdomen: Bowel Sound present, Soft and no tenderness, no hernia Extremities: no Pedal edema, no calf tenderness Neurology: alert and oriented to time, place, and person affect appropriate   The results of significant diagnostics from this hospitalization (including imaging, microbiology, ancillary and laboratory) are listed below for reference.    Significant Diagnostic Studies: US RENAL  Result Date: 04/21/2019 CLINICAL DATA:  Acute renal injury. EXAM: RENAL / URINARY TRACT ULTRASOUND COMPLETE COMPARISON:  CT report 05/06/2018. FINDINGS: Right Kidney: Renal measurements: 10.1 x 4.9 x 5.5 cm = volume: 141 mL . Echogenicity within normal limits. 2.2 cm simple cyst. Hydronephrosis visualized. Left Kidney: Renal measurements: 10.2 x 5.8 x 4.8 cm = volume: 148 mL. Echogenicity within normal limits. No mass. Mild hydronephrosis. Bladder: Patient has a Foley catheter.  Bladder nondistended. Other: None. IMPRESSION: 1.  Mild left hydronephrosis. 2.  2.2 cm simple cyst right kidney. Electronically Signed   By: Marcello Moores  Register   On: 04/21/2019 09:27   DG Chest Portable 1 View  Result Date: 04/20/2019 CLINICAL DATA:  Weakness for 3 days recurrent UTI an indwelling Foley catheter EXAM: PORTABLE CHEST 1 VIEW COMPARISON:  01/04/2019 FINDINGS: Cardiomediastinal contours are normal. Lungs are clear. No signs of pleural  effusion. No acute bone process. IMPRESSION: No acute cardiopulmonary disease. Electronically Signed   By: Zetta Bills M.D.   On: 04/20/2019 20:15    Microbiology: Recent Results (from the past 240 hour(s))  SARS CORONAVIRUS 2 (TAT 6-24 HRS) Nasopharyngeal Nasopharyngeal Swab     Status: None   Collection Time: 04/20/19  7:58 PM   Specimen: Nasopharyngeal Swab  Result Value Ref Range Status   SARS Coronavirus 2 NEGATIVE NEGATIVE Final    Comment: (NOTE) SARS-CoV-2 target nucleic acids are NOT DETECTED. The SARS-CoV-2 RNA is generally detectable in upper and lower respiratory specimens during the acute phase of infection. Negative results do not preclude SARS-CoV-2 infection, do not rule out co-infections with  other pathogens, and should not be used as the sole basis for treatment or other patient management decisions. Negative results must be combined with clinical observations, patient history, and epidemiological information. The expected result is Negative. Fact Sheet for Patients: SugarRoll.be Fact Sheet for Healthcare Providers: https://www.woods-mathews.com/ This test is not yet approved or cleared by the [place] FDA and  has been authorized for detection and/or diagnosis of SARS-CoV-2 by FDA under an Emergency Use Authorization (EUA). This EUA will remain  in effect (meaning this test can be used) for the duration of the COVID-19 declaration under Section 56 4(b)(1) of the Act, 21 U.S.C. section 360bbb-3(b)(1), unless the authorization is terminated or revoked sooner. Performed at [facility], [place] [address], [place], [place] [address]   Urine culture     Status: Abnormal (Preliminary result)   Collection Time: 04/20/19  8:37 PM   Specimen: Urine, Random  Result Value Ref Range Status   Specimen Description   Final    URINE, RANDOM Performed at [facility], [address] [place], [place] [address]     Special Requests   Final    NONE Performed at [facility], [address] [place], [place] [address]    Culture (A)  Final    >=100,000 COLONIES/mL GRAM NEGATIVE RODS IDENTIFICATION AND SUSCEPTIBILITIES TO FOLLOW Performed at [facility], [place] [address], [place],  [address]    Report Status PENDING  Incomplete     Labs: CBC: Recent Labs  Lab 04/20/19 1443 04/21/19 0546 04/22/19 0909  WBC 9.6 7.0 6.7  NEUTROABS  --   --  5.0  HGB 10.2* 9.2* 9.0*  HCT 30.9* 27.2* 28.1*  MCV 82.2 81.0 84.9  PLT 175 166 [id number]   Basic Metabolic Panel: Recent Labs  Lab 04/20/19 1443 04/21/19 0546 04/22/19 0909  NA 131* 131* 138  K 4.1 3.8 4.2  CL 96* 100 106  CO2 21* 17* 22  GLUCOSE 99 77 89  BUN 47* 44* 32*  CREATININE 3.32* 2.86* 1.47*  CALCIUM 8.5* 8.1* 8.3*  MG  --   --  1.9   Liver Function Tests: Recent Labs  Lab 04/22/19 0909  AST 61*  ALT 31  ALKPHOS 62  BILITOT 0.4  PROT 6.1*  ALBUMIN 3.1*   No results for input(s): LIPASE, AMYLASE in the last 168 hours. No results for input(s): AMMONIA in the last 168 hours. Cardiac Enzymes: No results for input(s): CKTOTAL, CKMB, CKMBINDEX, TROPONINI in the last 168 hours. BNP (last 3 results) No results for input(s): BNP in the last 8760 hours. CBG: Recent Labs  Lab 04/21/19 1016  GLUCAP 73   Time spent: 35 minutes  Signed:  Berle Mull  [facility]  04/22/2019 10:27 AM

## 2019-04-22 NOTE — TOC Progression Note (Signed)
Transition of Care [facility]) - Progression Note    Patient Details  Name: [name] MRN: [id number] Date of Birth: 10-01-1950  Transition of Care [facility]) CM/SW [place], RN Phone Number: 04/22/2019, 9:47 AM  Clinical Narrative:    Reviewed bed offers with the patient, she chose [facility], I called kelly to notify her at [facility], [facility] is waiving prior auth, I notified the Dr that we have a bed and do not need insurance if they are ready medically    Expected Discharge Plan: Skilled Nursing Facility Barriers to Discharge: Ship broker, Continued Medical Work up, SNF Pending bed offer  Expected Discharge Plan and Services Expected Discharge Plan: [place]                         DME Arranged: N/A         HH Arranged: NA           Social Determinants of Health (SDOH) Interventions    Readmission Risk Interventions Readmission Risk Prevention Plan 02/04/2019 01/07/2019  Transportation Screening - Complete  PCP or Specialist Appt within 3-5 Days - Complete  HRI or Home Care Consult Complete Complete  Social Work Consult for [place] Planning/Counseling Complete Complete  Palliative Care Screening Not Applicable Not Applicable  Medication Review Press photographer) - Complete  Some recent data might be hidden

## 2019-04-22 NOTE — Evaluation (Signed)
Occupational Therapy Evaluation Patient Details Name: NELISSA OEHLERT MRN: [id number] DOB: 1951/01/12 Today's Date: 04/22/2019    History of Present Illness Pt is a 68 y.o. female presenting to hospital 04/20/19 with generalized weakness.  Pt admitted with AKI superimposed on stage IVa CKD, possible UTI in setting of chronic indwelling foley cath, and generalized weakness.  PMH includes ADHD, anxiety, kyphoscoliosis, PVD, L LE cellulitis, MS, htn, COPD, urinary retention with chronic foley, portacath, back surgery, B-cell lymphoma.   Clinical Impression   Pt seen for OT evaluation this date. Prior to hospital admission, pt was requiring assist with bathing and dressing from her husband, reports performing fxl mobility with 4WW with no assist.  Pt lives in [facility] with ramped entrance, but still has small step to get into the home.  Currently pt demonstrates impairments in sitting balance and tolerance as well as gross weakness of trunk and limbs requiring MAX A for LB ADLs and MIN A for most aspects of UB self care at this time. While pt requires some assistance at baseline, pt now with decreased tolerance for transfers and static standing in addition.  Pt would benefit from skilled OT to address noted impairments and functional limitations (see below for any additional details) in order to maximize safety and independence while minimizing falls risk and caregiver burden.  Upon hospital discharge, recommend pt discharge to SNF.    Follow Up Recommendations  SNF    Equipment Recommendations  Other (comment)(defer to next venue of care.)    Recommendations for Other Services       Precautions / Restrictions Precautions Precautions: Fall Precaution Comments: foley catheter Restrictions Weight Bearing Restrictions: No      Mobility Bed Mobility Overal bed mobility: Needs Assistance Bed Mobility: Rolling Rolling: Mod assist   Supine to sit: Min assist;HOB elevated Sit to supine: Max assist   General bed mobility comments: assist for B LEs for sit to sup. Pt requires MAX A for propulsion towards HOB with bed slightly in trend.  Transfers                 General transfer comment: unable to attempt t/f on OT eval. OT brings walker over to pt, but pt then c/o pain under B LEs while in EOB sitting, particularly under R LE. Also c/o feeling over-heated. CTS not attempted at this time.    Balance Overall balance assessment: Needs assistance Sitting-balance support: No upper extremity supported;Feet supported Sitting balance-Leahy Scale: Good Sitting balance - Comments: G static sitting balance F dyanmic.       Standing balance comment: deferred                           ADL either performed or assessed with clinical judgement   ADL Overall ADL's : Needs assistance/impaired Eating/Feeding: Set up;Sitting   Grooming: Wash/dry hands;Wash/dry face;Oral care;Set up;Sitting   Upper Body Bathing: Minimal assistance;Sitting   Lower Body Bathing: Maximal assistance;Sitting/lateral leans   Upper Body Dressing : Minimal assistance;Sitting   Lower Body Dressing: Maximal assistance;Sitting/lateral leans     Toilet Transfer Details (indicate cue type and reason): unable to attempt t/f on OT eval. pt with c/o LE pain while seated EOB and feeling "overheated", does not feel she can particiapte in t/f attempt.                 Vision Patient Visual Report: No change from baseline Additional Comments: poor alignment and covergence at baseline  Perception     Praxis      Pertinent Vitals/Pain Pain Assessment: 0-10 Pain Score: 5  Pain Location: R LE while sitting EOB Pain Descriptors / Indicators: Aching Pain Intervention(s): Monitored during session;Repositioned     Hand Dominance Right   Extremity/Trunk Assessment Upper Extremity Assessment Upper Extremity Assessment: Generalized weakness;RUE deficits/detail;LUE deficits/detail RUE Deficits /  Details: shld 3-/5 (shld flexion to 1/2 arc of motion), elbow 3+/5, grip 4+/5 LUE Deficits / Details: shld 3+/5, elbow #+/5, grip 3+/5   Lower Extremity Assessment Lower Extremity Assessment: Defer to PT evaluation;Generalized weakness   Cervical / Trunk Assessment Cervical / Trunk Assessment: Kyphotic   Communication Communication Communication: No difficulties   Cognition Arousal/Alertness: Awake/alert Behavior During Therapy: WFL for tasks assessed/performed Overall Cognitive Status: Within Functional Limits for tasks assessed                                     General Comments       Exercises Other Exercises Other Exercises: OT facilitates education re: importance of OOB activity to help motivate pt to particiapte in OT Assessment. Pt verbalized and demos understanding. Other Exercises: OT facilitates educaiton re: role of OT with pt verbalizing understanding.   Shoulder Instructions      Home Living Family/patient expects to be discharged to:: Private residence Living Arrangements: Spouse/significant other Available Help at Discharge: Family Type of Home: House Home Access: Stairs to enter [employer] of Steps: 3 steps plus 3 more steps to enter home (no railing)               Bathroom Accessibility: Yes   Home Equipment: Walker - 4 wheels;Bedside commode   Additional Comments: pt reports 2 steps into house after ramp to get on porch      Prior Functioning/Environment Level of Independence: Needs assistance  Gait / Transfers Assistance Needed: Pt reports using 4WW for short household distances, sleeps in lift chair which helps with transfers. ADL's / Homemaking Assistance Needed: States she needs assist from husband for bathing and dressing, has reacher and [employer] shoehorn. States she needs another sock aid. States she has BSC over regular commode to elevate.            OT Problem List: Decreased strength;Decreased range of  motion;Decreased activity tolerance;Impaired balance (sitting and/or standing);Obesity;Impaired UE functional use;Pain      OT Treatment/Interventions: Self-care/ADL training;Therapeutic exercise;Energy conservation;DME and/or AE instruction;Therapeutic activities;Patient/family education;Balance training    OT Goals(Current goals can be found in the care plan section) Acute Rehab OT Goals Patient Stated Goal: to improve mobility OT Goal Formulation: With patient Time For Goal Achievement: 05/06/19 Potential to Achieve Goals: Good  OT Frequency: Min 1X/week   Barriers to D/C:            Co-evaluation              AM-PAC OT "6 Clicks" Daily Activity     Outcome Measure Help from another person eating meals?: None Help from another person taking care of personal grooming?: A Little Help from another person toileting, which includes using toliet, bedpan, or urinal?: A Lot Help from another person bathing (including washing, rinsing, drying)?: A Lot Help from another person to put on and taking off regular upper body clothing?: A Little Help from another person to put on and taking off regular lower body clothing?: A Lot 6 Click Score: 16   End of  Session    Activity Tolerance: Patient limited by pain Patient left: in bed;with call bell/phone within reach;with bed alarm set  OT Visit Diagnosis: Unsteadiness on feet (R26.81);Muscle weakness (generalized) (M62.81)                Time: [id number] OT Time Calculation (min): 31 min Charges:  OT General Charges $OT Visit: 1 Visit OT Evaluation $OT Eval Moderate Complexity: 1 Mod OT Treatments $Self Care/Home Management : 8-22 mins  Gerrianne Scale, MS, OTR/L ascom [phone] 04/22/19, 2:34 PM

## 2019-04-22 NOTE — Progress Notes (Signed)
Subjective: patient feels stronger today   Past Medical History:  Diagnosis Date  . Abdominal aortic atherosclerosis ([place]) 11/11/2016  . ADHD   . Anxiety   . COPD (chronic obstructive pulmonary disease) ([place])   . Depression    major depressive  . Dyspnea    doe  . Edema    left leg  . Follicular lymphoma ([place])    B Cell  . Follicular lymphoma grade II ([place])   . Hypertension   . Hypotension    idiopathic  . Kyphoscoliosis and scoliosis 11/26/2011  . Morbid obesity ([place]) 01/05/2016  . Multiple sclerosis ([place])   . Multiple sclerosis ([place])    1980's  . Neuromuscular disorder ([place])   . Obstructive and reflux uropathy    foley  . Pain    atypical facial  . Peripheral vascular disease of lower extremity with ulceration ([place]) 11/08/2015  . Skin ulcer ([place]) 11/08/2015  . Weakness    generalized. has MS    Past Surgical History:  Procedure Laterality Date  . BACK SURGERY N/A 2002  . CYST EXCISION     lower back  . INGUINAL LYMPH NODE BIOPSY Left 07/04/2016   Procedure: INGUINAL LYMPH NODE BIOPSY;  Surgeon: Christene Lye, MD;  Location: [facility] ORS;  Service: General;  Laterality: Left;  . PORTACATH PLACEMENT N/A 07/22/2016   Procedure: INSERTION PORT-A-CATH;  Surgeon: Christene Lye, MD;  Location: [facility] ORS;  Service: General;  Laterality: N/A;  . TONSILLECTOMY AND ADENOIDECTOMY    . TUBAL LIGATION      Family History  Problem Relation Age of Onset  . COPD Mother   . Diabetes Mother   . Heart failure Mother   . Alcohol abuse Father   . Kidney disease Father   . Kidney failure Father   . Arthritis Sister   . CAD Maternal Grandmother   . Stroke Maternal Grandfather   . Arthritis Sister   . Mental illness Sister   . Arthritis Brother     Social History:  reports that she quit smoking about 3 years ago. Her smoking use included cigarettes. She started smoking about 23 years ago. She has a 20.00 pack-year smoking history. She has never used smokeless tobacco. She  reports current drug use. Drug: Marijuana. She reports that she does not drink alcohol.  No Known Allergies  Medications: I have reviewed the patient's current medications.  Physical Examination: Blood pressure (!) 117/59, pulse 80, temperature 98.6 F (37 C), temperature source Oral, resp. rate 20, height 5\' 4"  (1.626 m), weight 131.6 kg, SpO2 96 %.   Neurological Examination   Mental Status: Alert, oriented, thought content appropriate.  Speech fluent without evidence of aphasia.  Able to follow 3 step commands without difficulty. Cranial Nerves: II: Discs flat bilaterally; Visual fields grossly normal, pupils equal, round, reactive to light and accommodation III,IV, VI: ptosis not present, extra-ocular motions intact bilaterally V,VII: smile symmetric, facial light touch sensation normal bilaterally VIII: hearing normal bilaterally IX,X: gag reflex present XI: bilateral shoulder shrug XII: midline tongue extension Motor: Right : Upper extremity   5/5    Left:     Upper extremity   5/5  Lower extremity   4/5     Lower extremity   4/5 Tone and bulk:normal tone throughout; no atrophy noted Sensory: Pinprick and light touch intact throughout, bilaterally    Laboratory Studies:   Basic Metabolic Panel: Recent Labs  Lab 04/20/19 1443 04/21/19 0546 04/22/19 0909  NA 131* 131* 138  K 4.1 3.8 4.2  CL 96* 100 106  CO2 21* 17* 22  GLUCOSE 99 77 89  BUN 47* 44* 32*  CREATININE 3.32* 2.86* 1.47*  CALCIUM 8.5* 8.1* 8.3*  MG  --   --  1.9    Liver Function Tests: Recent Labs  Lab 04/22/19 0909  AST 61*  ALT 31  ALKPHOS 62  BILITOT 0.4  PROT 6.1*  ALBUMIN 3.1*   No results for input(s): LIPASE, AMYLASE in the last 168 hours. No results for input(s): AMMONIA in the last 168 hours.  CBC: Recent Labs  Lab 04/20/19 1443 04/21/19 0546 04/22/19 0909  WBC 9.6 7.0 6.7  NEUTROABS  --   --  5.0  HGB 10.2* 9.2* 9.0*  HCT 30.9* 27.2* 28.1*  MCV 82.2 81.0 84.9  PLT 175  166 206    Cardiac Enzymes: No results for input(s): CKTOTAL, CKMB, CKMBINDEX, TROPONINI in the last 168 hours.  BNP: Invalid input(s): POCBNP  CBG: Recent Labs  Lab 04/21/19 1016  GLUCAP 73    Microbiology: Results for orders placed or performed during the hospital encounter of 04/20/19  SARS CORONAVIRUS 2 (TAT 6-24 HRS) Nasopharyngeal Nasopharyngeal Swab     Status: None   Collection Time: 04/20/19  7:58 PM   Specimen: Nasopharyngeal Swab  Result Value Ref Range Status   SARS Coronavirus 2 NEGATIVE NEGATIVE Final    Comment: (NOTE) SARS-CoV-2 target nucleic acids are NOT DETECTED. The SARS-CoV-2 RNA is generally detectable in upper and lower respiratory specimens during the acute phase of infection. Negative results do not preclude SARS-CoV-2 infection, do not rule out co-infections with other pathogens, and should not be used as the sole basis for treatment or other patient management decisions. Negative results must be combined with clinical observations, patient history, and epidemiological information. The expected result is Negative. Fact Sheet for Patients: SugarRoll.be Fact Sheet for Healthcare Providers: https://www.woods-mathews.com/ This test is not yet approved or cleared by the [place] FDA and  has been authorized for detection and/or diagnosis of SARS-CoV-2 by FDA under an Emergency Use Authorization (EUA). This EUA will remain  in effect (meaning this test can be used) for the duration of the COVID-19 declaration under Section 56 4(b)(1) of the Act, 21 U.S.C. section 360bbb-3(b)(1), unless the authorization is terminated or revoked sooner. Performed at [facility], [place] [address], [place], [place] [address]   Urine culture     Status: Abnormal (Preliminary result)   Collection Time: 04/20/19  8:37 PM   Specimen: Urine, Random  Result Value Ref Range Status   Specimen Description   Final    URINE,  RANDOM Performed at [facility], [address], [place], [place] [address]    Special Requests   Final    NONE Performed at [facility], [address], [place], [place] [address]    Culture (A)  Final    >=100,000 COLONIES/mL GRAM NEGATIVE RODS IDENTIFICATION AND SUSCEPTIBILITIES TO FOLLOW Performed at [facility], [place] [address], [place], [place] [address]    Report Status PENDING  Incomplete    Coagulation Studies: No results for input(s): LABPROT, INR in the last 72 hours.  Urinalysis:  Recent Labs  Lab 04/20/19 1443 04/20/19 2037  COLORURINE AMBER* YELLOW*  LABSPEC 1.018 1.017  PHURINE 5.0 5.0  GLUCOSEU NEGATIVE NEGATIVE  HGBUR MODERATE* SMALL*  BILIRUBINUR NEGATIVE NEGATIVE  KETONESUR NEGATIVE NEGATIVE  PROTEINUR 100* 100*  NITRITE NEGATIVE NEGATIVE  LEUKOCYTESUR LARGE* LARGE*  Lipid Panel:     Component Value Date/Time   CHOL 155 02/02/2018 1613   TRIG 147 02/02/2018 1613   HDL 35 (L) 02/02/2018 1613   CHOLHDL 4.4 02/02/2018 1613   VLDL 28 11/08/2015 1307   LDLCALC 96 02/02/2018 1613    HgbA1C:  Lab Results  Component Value Date   HGBA1C 5.6 01/04/2019    Urine Drug Screen:      Component Value Date/Time   LABOPIA NONE DETECTED 07/04/2016 1048   COCAINSCRNUR NONE DETECTED 07/04/2016 1048   LABBENZ NONE DETECTED 07/04/2016 1048   AMPHETMU NONE DETECTED 07/04/2016 1048   THCU NONE DETECTED 07/04/2016 1048   LABBARB NONE DETECTED 07/04/2016 1048    Alcohol Level: No results for input(s): ETH in the last 168 hours.  Other results: EKG: normal EKG, normal sinus rhythm, unchanged from previous tracings.  Imaging: US RENAL  Result Date: 04/21/2019 CLINICAL DATA:  Acute renal injury. EXAM: RENAL / URINARY TRACT ULTRASOUND COMPLETE COMPARISON:  CT report 05/06/2018. FINDINGS: Right Kidney: Renal measurements: 10.1 x 4.9 x 5.5 cm = volume: 141 mL . Echogenicity within normal limits. 2.2 cm simple cyst. Hydronephrosis  visualized. Left Kidney: Renal measurements: 10.2 x 5.8 x 4.8 cm = volume: 148 mL. Echogenicity within normal limits. No mass. Mild hydronephrosis. Bladder: Patient has a Foley catheter.  Bladder nondistended. Other: None. IMPRESSION: 1.  Mild left hydronephrosis. 2.  2.2 cm simple cyst right kidney. Electronically Signed   By: Marcello Moores  Register   On: 04/21/2019 09:27   DG Chest Portable 1 View  Result Date: 04/20/2019 CLINICAL DATA:  Weakness for 3 days recurrent UTI an indwelling Foley catheter EXAM: PORTABLE CHEST 1 VIEW COMPARISON:  01/04/2019 FINDINGS: Cardiomediastinal contours are normal. Lungs are clear. No signs of pleural effusion. No acute bone process. IMPRESSION: No acute cardiopulmonary disease. Electronically Signed   By: Zetta Bills M.D.   On: 04/20/2019 20:15     Assessment/Plan:   68 y.o. female  with a known history of hypertension, B-cell lymphoma, multiple sclerosis seen by Dr. Manuella Ghazi, COPD, urinary retention with chronic indwelling Foley catheter, depression, anxiety and ADHD who presented to the emergency room with  generalized weakness over the last couple of days to a week.  She stated that she has been feeling so weak she could not pick her feet up to the chair.  She usually ambulates with a walker and could not do that the last couple of days.  No fever or chills.  No nausea or vomiting or abdominal pain.  No headache or dizziness or blurred vision no worsening weakness or paresthesias. Pt is found to have AKI and suspected UTI. Pt does receive weekly Avonex injections but missed last one.   - Patient will ask her husband to bring in the Avonex which we will administer in the hospital. Still awaiting for him to come in - Stronger today in her legs.  - Not convinced MS exacerbation and could be provoked by AKI. Pt does have chronic foley and is usually Leukocyte positive and many WBC and bacteria - Would hold off steroids as not convinced this is MS exacerbation. She did  received steroids in past but none recently - d/c when appropriate from Pt/ot and medical stand point.   04/22/2019, 11:36 AM

## 2019-04-22 NOTE — TOC Transition Note (Signed)
Transition of Care [facility]) - CM/SW Discharge Note   Patient Details  Name: [name] MRN: [id number] Date of Birth: [birth year]  [facility] CM/SW Contact:  Su Hilt, RN Phone Number: 04/22/2019, 12:30 PM   Clinical Narrative:    Patient to DC to [facility] today via EMS for transport The husband [name] has been made aware Bedside Nurse to call report to [facility] and call EMS for transport when ready    Final next level of care: Skilled Nursing Facility Barriers to Discharge: Barriers Resolved   Patient Goals and CMS Choice        Discharge Placement              Patient chooses bed at: [facility] Patient to be transferred to facility by: EMS Name of family member notified: [name] Patient and family notified of of transfer: 04/22/19  Discharge Plan and Services                DME Arranged: N/A         HH Arranged: NA          Social Determinants of Health (SDOH) Interventions     Readmission Risk Interventions Readmission Risk Prevention Plan 04/22/2019 02/04/2019 01/07/2019  Transportation Screening Complete - Complete  PCP or Specialist Appt within 3-5 Days - - Complete  HRI or Home Care Consult - Complete Complete  Social Work Consult for [place] Planning/Counseling - Complete Complete  Palliative Care Screening - Not Applicable Not Applicable  Medication Review Press photographer) Referral to Pharmacy - Complete  PCP or Specialist appointment within 3-5 days of discharge Complete - -  HRI or Home Care Consult Complete - -  SW Recovery Care/Counseling Consult Complete - -  Palliative Care Screening Not Applicable - -  Skilled Nursing Facility Complete - -  Some recent data might be hidden

## 2019-04-22 NOTE — Progress Notes (Signed)
Called [facility] to inquire about pts discharge.. informed the authorization was still showing as pending on their end. Will have RN f/u tmw.

## 2019-04-23 LAB — URINE CULTURE: Culture: >=100000 — AB

## 2019-04-23 LAB — SARS CORONAVIRUS 2 (TAT 6-24 HRS): SARS Coronavirus 2: NEGATIVE

## 2019-04-23 NOTE — Progress Notes (Signed)
[facility] Progress Note  Patient: [name] [id number]   PCP: Kirk Ruths, MD DOB: 08/15/1950   DOA: 04/20/2019   DOS: 04/23/2019   Date of Service: the patient was seen and examined on 04/23/2019  Chief Complaint  Patient presents with  . Weakness   Brief hospital course: [name]  is a 68 y.o. Caucasian female with a known history of hypertension, B-cell lymphoma, multiple sclerosis, COPD, urinary retention with chronic indwelling Foley catheter, depression, anxiety and ADHD who presented to the emergency room with acute onset of generalized weakness over the last couple of days.  She stated that she has been feeling so weak she could not pick her feet up to the chair.  She usually ambulates with a walker and could not do that the last couple of days.  No fever or chills.  No nausea or vomiting or abdominal pain.  No headache or dizziness or blurred vision no worsening weakness or paresthesias.  She stated that her urine has been smelling horrible.  She has been having significant diminished appetite.  No recent exposure to COVID-19.  Currently further plan is await for safe discharge plan  Subjective: No acute complaint no nausea no vomiting no fever no chills.  Assessment and Plan: Scheduled Meds: . atorvastatin  10 mg Oral q1800  . baclofen  10 mg Oral TID  . buPROPion  300 mg Oral Daily  . Chlorhexidine Gluconate Cloth  6 each Topical Daily  . clonazePAM  0.5 mg Oral QHS  . DULoxetine  60 mg Oral BH-q7a  . gabapentin  600 mg Oral TID  . heparin injection (subcutaneous)  5,000 Units Subcutaneous Q8H  . mirabegron ER  50 mg Oral Daily  . mometasone-formoterol  2 puff Inhalation BID  . multivitamin with minerals  1 tablet Oral QPM  . QUEtiapine Fumarate  150 mg Oral QHS  . sodium chloride flush  3 mL Intravenous Once  . Teriflunomide  14 mg Oral Daily  . tiZANidine  4 mg Oral TID   Continuous Infusions:  PRN Meds: acetaminophen **OR** acetaminophen,  albuterol, docusate sodium, ondansetron **OR** ondansetron (ZOFRAN) IV, polyethylene glycol, traZODone  1.  Acute kidney injury superimposed on stage IIIa chronic kidney disease.  This is likely prerenal secondary to volume depletion and dehydration.   Treated with IV fluid. Avoid nephrotoxic medication. Hold off on lisinopril.  2. asymptomatic bacteriuria in the setting of chronic indwelling Foley cath. Suspect this is asymptomatic bacteriuria. We will monitor for now.  No antibiotics.  3.  Generalized weakness likely secondary to #1 and #2.  Management as above.  Physical therapy consult will be obtained.  4.  COPD.  No current exacerbation.  We will continue her Symbicort and albuterol MDI.  5.  Hypertension.  We will continue her Norvasc, hydralazine and hold off lisinopril given acute kidney injury.  6.  Anxiety and depression.  We will continue her Wellbutrin XL, Cymbalta and Klonopin.  7.  Dyslipidemia.  We will continue statin therapy.  8.  Multiple sclerosis.  We will continue her baclofen and Zanaflex.  She gets weekly Avonex on Mondays.  I do not believe her generalized weakness is a flare of her multiple sclerosis.   Appreciate neurological assistance.  9.  Obesity Body mass index is 49.8 kg/m.  Patient will benefit from outpatient dietary consultation.  10 pressure ulcer left heel DTI POA. Foam dressing.  Pressure Injury 04/21/19 Heel Left Deep Tissue Pressure Injury - Purple or maroon localized  area of discolored intact skin or blood-filled blister due to damage of underlying soft tissue from pressure and/or shear. Purple blister. Heels on pillows (Active)  04/21/19 0100  Location: Heel  Location Orientation: Left  Staging: Deep Tissue Pressure Injury - Purple or maroon localized area of discolored intact skin or blood-filled blister due to damage of underlying soft tissue from pressure and/or shear.  Wound Description (Comments): Purple blister. Heels on  pillows  Present on Admission: Yes     Diet: Cardiac diet  DVT Prophylaxis: Subcutaneous Heparin    Advance goals of care discussion: Full code  Family Communication: NO family was present at bedside, at the time of interview.  Disposition:  Discharge to SNF.  Patient already discharged to [facility] 04/22/2019 but they were waiting for PASSR number 04/22/2019.  Her Covid test was valid up to 04/22/2019. On 04/22/2019 I have been notified that the patient can be discharged to SNF and we do not have to wait for PASSR number, but we will need a new Covid test.  Test ordered.  Awaiting results before transferring to SNF.  Consultants: Neurology Procedures: none  Antibiotics: Anti-infectives (From admission, onward)   None       Objective: Physical Exam: Vitals:   04/22/19 0729 04/22/19 1452 04/23/19 0001 04/23/19 0812  BP: (!) 117/59 (!) 120/58 (!) 133/56 131/77  Pulse: 80 84 68 75  Resp: 20 18 16 18   Temp: 98.6 F (37 C) 98.7 F (37.1 C) 98.5 F (36.9 C) 98.8 F (37.1 C)  TempSrc: Oral Oral Oral Oral  SpO2: 96% 97% 99% 95%  Weight:      Height:        Intake/Output Summary (Last 24 hours) at 04/23/2019 1538 Last data filed at 04/23/2019 1300 Gross per 24 hour  Intake 360 ml  Output 1900 ml  Net -1540 ml   Filed Weights   04/20/19 1434 04/21/19 0458  Weight: 123.8 kg 131.6 kg   General: alert and oriented to time, place, and person. Appear in mild distress, affect appropriate Eyes: PERRL, Conjunctiva normal ENT: Oral Mucosa Clear, moist  Neck: no JVD, no Abnormal Mass Or lumps Cardiovascular: S1 and S2 Present, no Murmur,  Respiratory: good respiratory effort, Bilateral Air entry equal and Decreased, no signs of accessory muscle use, Clear to Auscultation, no Crackles, no wheezes Abdomen: Bowel Sound present, Soft and no tenderness, no hernia Skin: no rashes  Extremities: no Pedal edema, no calf tenderness Neurologic: without any new focal findings Gait not checked  due to patient safety concerns  Data Reviewed: I have personally reviewed and interpreted daily labs, tele strips, imagings as discussed above. I reviewed all nursing notes, pharmacy notes, vitals, pertinent old records I have discussed plan of care as described above with RN and patient/family.  CBC: Recent Labs  Lab 04/20/19 1443 04/21/19 0546 04/22/19 0909  WBC 9.6 7.0 6.7  NEUTROABS  --   --  5.0  HGB 10.2* 9.2* 9.0*  HCT 30.9* 27.2* 28.1*  MCV 82.2 81.0 84.9  PLT 175 166 [id number]   Basic Metabolic Panel: Recent Labs  Lab 04/20/19 1443 04/21/19 0546 04/22/19 0909  NA 131* 131* 138  K 4.1 3.8 4.2  CL 96* 100 106  CO2 21* 17* 22  GLUCOSE 99 77 89  BUN 47* 44* 32*  CREATININE 3.32* 2.86* 1.47*  CALCIUM 8.5* 8.1* 8.3*  MG  --   --  1.9    Liver Function Tests: Recent Labs  Lab 04/22/19 [phone]  AST 61*  ALT 31  ALKPHOS 62  BILITOT 0.4  PROT 6.1*  ALBUMIN 3.1*   No results for input(s): LIPASE, AMYLASE in the last 168 hours. No results for input(s): AMMONIA in the last 168 hours. Coagulation Profile: No results for input(s): INR, PROTIME in the last 168 hours. Cardiac Enzymes: No results for input(s): CKTOTAL, CKMB, CKMBINDEX, TROPONINI in the last 168 hours. BNP (last 3 results) No results for input(s): PROBNP in the last 8760 hours. CBG: Recent Labs  Lab 04/21/19 1016  GLUCAP 73   Studies: No results found.   Time spent: 35 minutes  Author: Berle Mull, MD [facility] 04/23/2019 3:38 PM  To reach On-call, see care teams to locate the attending and reach out to them via [email or web address]. If 7PM-7AM, please contact night-coverage If you still have difficulty reaching the attending provider, please page the [facility] (Director on Call) for [facility] on amion for assistance.

## 2019-04-23 NOTE — TOC Progression Note (Addendum)
Transition of Care [facility]) - Progression Note    Patient Details  Name: [name] MRN: [id number] Date of Birth: [birth year]  [facility]) CM/SW Contact  Marshell Garfinkel, RN Phone Number: 04/23/2019, 9:29 AM  Clinical Narrative:     Rosalie Gums under manual review still; [facility]), per [name], can wave PASRR and insurance Josem Kaufmann is also being waived. Will need repeat Covid19 screen. [name] with [facility] updated. Update at 1441: Covid lab results still pending. Per [name] with [facility], they can take patient when lab results are back. TOC to follow.   Expected Discharge Plan: Skilled Nursing Facility Barriers to Discharge: Barriers Resolved  Expected Discharge Plan and Services Expected Discharge Plan: [place]         Expected Discharge Date: 04/22/19               DME Arranged: N/A         HH Arranged: NA           Social Determinants of Health (SDOH) Interventions    Readmission Risk Interventions Readmission Risk Prevention Plan 04/22/2019 02/04/2019 01/07/2019  Transportation Screening Complete - Complete  PCP or Specialist Appt within 3-5 Days - - Complete  HRI or [place] - Complete Complete  Social Work Consult for [place] Planning/Counseling - Complete Complete  Palliative Care Screening - Not Applicable Not Applicable  Medication Review Press photographer) Referral to Pharmacy - Complete  PCP or Specialist appointment within 3-5 days of discharge Complete - -  [place] or Home Care Consult Complete - -  SW Recovery Care/Counseling Consult Complete - -  Palliative Care Screening Not Applicable - -  Skilled Nursing Facility Complete - -  Some recent data might be hidden

## 2019-04-24 DIAGNOSIS — R531 Weakness: Secondary | ICD-10-CM

## 2019-04-24 NOTE — NC FL2 (Signed)
[place] LEVEL OF CARE SCREENING TOOL     IDENTIFICATION  Patient Name: [name] Birthdate: Sep 16, 1950 Sex: female Admission Date (Current Location): 04/20/2019  [place] and [place] Number:  Engineering geologist and Address:  [facility], [address] [place], [place] [address]      Provider Number: [id number]  Attending Physician Name and Address:  Nolberto Hanlon, MD  Relative Name and Phone Number:  Jerrye Noble [phone]    Current Level of Care: Hospital Recommended Level of Care: [place] Prior Approval Number:    Date Approved/Denied:   PASRR Number: Pending  Discharge Plan: SNF    Current Diagnoses: Patient Active Problem List   Diagnosis Date Noted  . AKI (acute kidney injury) ([place]) 04/20/2019  . Pressure injury of skin 02/03/2019  . Bilateral lower leg cellulitis 02/02/2019  . Ovarian mass, left 01/27/2019  . Sepsis ([place]) 01/05/2019  . UTI (urinary tract infection) 06/13/2018  . Altered mental status 06/11/2018  . Fall 05/13/2018  . Depression 05/13/2018  . Recurrent cellulitis of lower extremity 06/25/2017  . Medication monitoring encounter 06/05/2017  . Cellulitis of left lower leg 04/25/2017  . Adjustment disorder with mixed disturbance of emotions and conduct 04/23/2017  . Foot pain, bilateral 02/03/2017  . Tinea pedis 02/03/2017  . Left ovarian cyst 12/09/2016  . Abdominal aortic atherosclerosis ([place]) 11/11/2016  . Swelling of lower extremity 10/11/2016  . Obstructive sleep apnea 10/11/2016  . Follicular lymphoma of intra-abdominal lymph nodes ([place]) 08/06/2016  . Multiple falls 06/07/2016  . Inguinal adenopathy 05/29/2016  . Obesity, morbid ([place]) 01/05/2016  . Low HDL (under 40) 12/19/2015  . Skin ulcer ([place]) 11/08/2015  . Peripheral vascular disease of lower extremity with ulceration ([place]) 11/08/2015  . CKD (chronic kidney disease) stage 3, GFR 30-59 ml/min 11/08/2015  .  Constipation due to pain medication 01/31/2015  . Obstructive sleep apnea of adult 01/13/2015  . Pelvic muscle wasting 01/13/2015  . Incomplete bladder emptying 01/13/2015  . Headache, migraine 10/24/2014  . 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 Current tobacco use 10/24/2014  . Lumbar radiculopathy, chronic 10/02/2013  . COPD with bronchial hyperresponsiveness ([place]) 10/02/2013  . Major depressive disorder, recurrent, in partial remission ([place]) 10/02/2013  . Hypertension goal BP (blood pressure) < 140/90 10/02/2013  . Multiple sclerosis ([place]) 10/02/2013  . Absence of bladder continence 09/25/2012  . Acontractile bladder 02/12/2012  . Narrowing of intervertebral disc space 11/26/2011  . Kyphoscoliosis and scoliosis 11/26/2011    Orientation RESPIRATION BLADDER Height & Weight     Self, Time, Situation, Place  Normal Continent Weight: 131.6 kg Height:  5\' 4"  (162.6 cm)  BEHAVIORAL SYMPTOMS/MOOD NEUROLOGICAL BOWEL NUTRITION STATUS      Continent Supplemental  AMBULATORY STATUS COMMUNICATION OF NEEDS Skin   Extensive Assist   Normal                       Personal Care Assistance Level of Assistance  Bathing Bathing Assistance: Limited assistance   Dressing Assistance: Maximum assistance     Functional Limitations Info             SPECIAL CARE FACTORS FREQUENCY  PT (By licensed PT)     PT Frequency: 5 times per week              Contractures Contractures Info: Not present    Additional Factors Info  Allergies, Code Status Code Status Info: Full Allergies Info: NKDA  Current Medications (04/24/2019):  This is the current hospital active medication list Current Facility-Administered Medications  Medication Dose Route Frequency Provider Last Rate Last Admin  . acetaminophen (TYLENOL) tablet 650 mg  650 mg Oral Q6H PRN Mansy, Jan A, MD   650 mg at 04/23/19 2356   Or  . acetaminophen (TYLENOL) suppository 650 mg  650 mg Rectal Q6H PRN Mansy, Jan  A, MD      . albuterol (VENTOLIN HFA) 108 (90 Base) MCG/ACT inhaler 1 puff  1 puff Inhalation Q4H PRN Mansy, Jan A, MD      . atorvastatin (LIPITOR) tablet 10 mg  10 mg Oral q1800 Mansy, Jan A, MD   10 mg at 04/23/19 2149  . baclofen (LIORESAL) tablet 10 mg  10 mg Oral TID Mansy, Jan A, MD   10 mg at 04/23/19 2150  . buPROPion (WELLBUTRIN XL) 24 hr tablet 300 mg  300 mg Oral Daily Mansy, Jan A, MD   300 mg at 04/23/19 0916  . Chlorhexidine Gluconate Cloth 2 % PADS 6 each  6 each Topical Daily Lavina Hamman, MD   6 each at 04/23/19 1657  . clonazePAM (KLONOPIN) tablet 0.5 mg  0.5 mg Oral QHS Mansy, Jan A, MD   0.5 mg at 04/23/19 2151  . docusate sodium (COLACE) capsule 100 mg  100 mg Oral BID PRN Mansy, Jan A, MD   100 mg at 04/22/19 1138  . DULoxetine (CYMBALTA) DR capsule 60 mg  60 mg Oral [facility], Jan A, MD   60 mg at 04/24/19 0605  . gabapentin (NEURONTIN) tablet 600 mg  600 mg Oral TID Mansy, Jan A, MD   600 mg at 04/23/19 2149  . heparin injection 5,000 Units  5,000 Units Subcutaneous Q8H Hart Robinsons A, RPH   5,000 Units at 04/24/19 [id number]  . mirabegron ER (MYRBETRIQ) tablet 50 mg  50 mg Oral Daily Mansy, Jan A, MD   50 mg at 04/23/19 [id number]  . mometasone-formoterol (DULERA) 200-5 MCG/ACT inhaler 2 puff  2 puff Inhalation BID Mansy, Arvella Merles, MD   2 puff at 04/23/19 2148  . multivitamin with minerals tablet 1 tablet  1 tablet Oral QPM Mansy, Arvella Merles, MD   1 tablet at 04/23/19 2149  . ondansetron (ZOFRAN) tablet 4 mg  4 mg Oral Q6H PRN Mansy, Jan A, MD       Or  . ondansetron [facility]) injection 4 mg  4 mg Intravenous Q6H PRN Mansy, Jan A, MD      . polyethylene glycol (MIRALAX / GLYCOLAX) packet 17 g  17 g Oral Daily PRN Mansy, Jan A, MD      . QUEtiapine (SEROQUEL XR) 24 hr tablet 150 mg  150 mg Oral QHS Mansy, Jan A, MD   150 mg at 04/23/19 2150  . sodium chloride flush (NS) 0.9 % injection 3 mL  3 mL Intravenous Once Blake Divine, MD      . Teriflunomide TABS 14 mg  14 mg Oral Daily  Mansy, Jan A, MD      . tiZANidine (ZANAFLEX) tablet 4 mg  4 mg Oral TID Mansy, Jan A, MD   4 mg at 04/23/19 2151  . traZODone (DESYREL) tablet 25 mg  25 mg Oral QHS PRN Mansy, Arvella Merles, MD       Facility-Administered Medications Ordered in Other Encounters  Medication Dose Route Frequency Provider Last Rate Last Admin  . heparin lock flush 100 unit/mL  500 Units Intracatheter Once  PRN Sindy Guadeloupe, MD         Discharge Medications: Please see discharge summary for a list of discharge medications.  Relevant Imaging Results:  Relevant Lab Results:   Additional Information [id number]  Marshell Garfinkel, RN

## 2019-04-24 NOTE — Progress Notes (Signed)
Report called to receiving nurse at [facility].

## 2019-04-24 NOTE — Discharge Summary (Signed)
[name] [id number] DOB: [birth year] DOA: 04/20/2019  PCP: Kirk Ruths, MD  Admit date: 04/20/2019 Discharge date: 04/24/2019 Admitted From: home Disposition:  SNF   Recommendations for Outpatient Follow-up:  1. Follow up with PCP in 1 week 2. Please obtain BMP/CBC in one week      Discharge Condition:Stable CODE STATUS:Full Diet recommendation: Heart Healthy   Brief/Interim Summary: As per the H and P dictated on admission, "BettyWolfeis a68 y.o.Caucasian femalewith a known history ofhypertension, B-cell lymphoma, multiple sclerosis, COPD, urinary retention with chronic indwelling Foley catheter,depression, anxiety and ADHD who presented to the emergency room with acute onset ofgeneralized weakness over the last couple of days. She stated that she has been feeling so weak she could not pick her feet up to the chair. She usually ambulates with a walker and could not do that the last couple of days. No fever or chills. No nausea or vomiting or abdominal pain. No headache or dizziness or blurred vision no worsening weakness or paresthesias. She stated that her urine has beensmelling horrible. She has been having significant diminished appetite.No recent exposure to COVID-19.  Upon presentation to the emergency room, vital signs were within normal and later blood pressure was 134/54. Labs revealed anemia slightly worse than previous levels last month with hemoglobin of 10.2 and hematocrit 30.9 compared to 11.2/35.3. Lactic acid was 1.4. UA came back with more than 50 WBCs and many bacteria with 21-50 RBCs. Repeat UA also showed more than 50 WBCs with 6-10 RBCs and WBC clumps were positive in both samples and protein was 100 in both with negative nitrite and large leukocytes. BMP was remarkable for hyponatremia 131 and hypochloremia 96 compared to normal levels on 03/08/2019, BUN of 47 compared to 24 and creatinine of 3.32 compared to 1.38 then with baseline  stage IIIa chronic kidney disease.  The patient was given 1 L bolus of IV lactated Ringer. She will be admitted to [facility] bed for further evaluation and management"  Hospital Course:  Summary of her active problems in the hospital is as following. 1. Acute kidney injury superimposed on stage IIIa chronic kidney disease.  This is likely prerenal secondary to volume depletion and dehydration. Treated with IV fluid. Avoid nephrotoxic medication. Hold off on lisinopril.  2. asymptomatic bacteriuria in the setting of chronic indwelling Foley cath. Suspect this is asymptomatic bacteriuria. We will monitor for now. No antibiotics.  3. Generalized weakness likely secondary to #1 and #2. Management as above. Physical therapy consult will be obtained.  4. COPD.  No current exacerbation.  We will continue her Symbicort and albuterol MDI.  5. Hypertension. We will hold her Norvasc, hydralazine and hold off lisinopril given acute kidney injury and normal blood pressure  6. Anxiety and depression.  We will continue her Wellbutrin XL, Cymbalta and Klonopin.  7. Dyslipidemia.  We will continue statin therapy.  8. Multiple sclerosis.  We will continue her baclofen and Zanaflex. She gets weekly Avonex on Mondays.I do not believe her generalized weakness is a flare of her multiple sclerosis. Appreciate neurological assistance.  9.Obesity Body mass index is 49.8 kg/m. Patient will benefit from outpatient dietary consultation.  10pressure ulcer left heel DTI POA. Foam dressing.     Pressure Injury 04/21/19 Heel Left Deep Tissue Pressure Injury - Purple or maroon localized area of discolored intact skin or blood-filled blister due to damage of underlying soft tissue from pressure and/or shear. Purple blister. Heels on pillows (Active)  04/21/19 0100  Location: Heel  Location Orientation: Left  Staging: Deep Tissue Pressure Injury - Purple or  maroon localized area of discolored intact skin or blood-filled blister due to damage of underlying soft tissue from pressure and/or shear.  Wound Description (Comments): Purple blister. Heels on pillows  Present on Admission: Yes    Patient was seen by physical therapy, who recommended SNF, which was arranged. On the day of the discharge the patient's vitals were stable, and no other acute medical condition were reported by patient. the patient was felt safe to be discharge at SNF with SNF.   Discharge Diagnoses:  Active Problems:   AKI (acute kidney injury) [facility])    Discharge Instructions  Discharge Instructions    Diet - low sodium heart healthy   Complete by: As directed    Increase activity slowly   Complete by: As directed      Allergies as of 04/24/2019   No Known Allergies     Medication List    STOP taking these medications   amLODipine 5 MG tablet Commonly known as: NORVASC   lisinopril 20 MG tablet Commonly known as: ZESTRIL     TAKE these medications   acetaminophen 325 MG tablet Commonly known as: TYLENOL Take 2 tablets (650 mg total) by mouth every 6 (six) hours as needed for mild pain (or Fever >/= 101).   albuterol 108 (90 Base) MCG/ACT inhaler Commonly known as: VENTOLIN HFA Inhale 1 puff into the lungs every 4 (four) hours as needed for shortness of breath.   atorvastatin 10 MG tablet Commonly known as: LIPITOR Take 10 mg by mouth daily.   Aubagio 14 MG Tabs Generic drug: Teriflunomide Take 14 mg by mouth daily.   baclofen 10 MG tablet Commonly known as: LIORESAL Take 10 mg by mouth 3 (three) times daily.   buPROPion 300 MG 24 hr tablet Commonly known as: WELLBUTRIN XL Take 300 mg by mouth daily.   clonazePAM 1 MG tablet Commonly known as: KLONOPIN Take 0.5 tablets (0.5 mg total) by mouth at bedtime.   docusate sodium 100 MG capsule Commonly known as: COLACE Take 1 capsule (100 mg total) by mouth 2 (two) times daily. What changed:    when to take this  reasons to take this   DULoxetine 60 MG capsule Commonly known as: CYMBALTA Take 1 capsule (60 mg total) by mouth every morning.   gabapentin 600 MG tablet Commonly known as: NEURONTIN Take 1 tablet (600 mg total) by mouth 3 (three) times daily.   hydrALAZINE 50 MG tablet Commonly known as: APRESOLINE Take 50 mg by mouth 3 (three) times daily.   interferon beta-1a 30 MCG/0.5ML Pskt injection Commonly known as: AVONEX Inject 30 mcg into the muscle every Monday.   multivitamin with minerals Tabs tablet Take 1 tablet by mouth every evening.   Myrbetriq 50 MG Tb24 tablet Generic drug: mirabegron ER TAKE ONE TABLET BY MOUTH ONCE DAILY What changed:   how much to take  how to take this  when to take this   polyethylene glycol 17 g packet Commonly known as: MIRALAX / GLYCOLAX Take 17 g by mouth daily as needed for mild constipation.   potassium chloride 10 MEQ tablet Commonly known as: KLOR-CON Take 10 mEq by mouth daily.   QUEtiapine Fumarate 150 MG 24 hr tablet Commonly known as: SEROQUEL XR Take 150 mg by mouth at bedtime.   Symbicort 160-4.5 MCG/ACT inhaler Generic drug: budesonide-formoterol Inhale 2 puffs into the lungs 2 (two) times daily.   tiZANidine  4 MG tablet Commonly known as: ZANAFLEX Take 4 mg by mouth 3 (three) times daily.   vitamin C 500 MG tablet Commonly known as: ASCORBIC ACID Take 500 mg by mouth every evening.      Contact information for after-discharge care    [facility] .   Service: Skilled Nursing Contact information: [place] [phone]             No Known Allergies  Consultations:   Neurology  Procedures/Studies: US RENAL  Result Date: 04/21/2019 CLINICAL DATA:  Acute renal injury. EXAM: RENAL / URINARY TRACT ULTRASOUND COMPLETE COMPARISON:  CT report 05/06/2018. FINDINGS: Right Kidney: Renal  measurements: 10.1 x 4.9 x 5.5 cm = volume: 141 mL . Echogenicity within normal limits. 2.2 cm simple cyst. Hydronephrosis visualized. Left Kidney: Renal measurements: 10.2 x 5.8 x 4.8 cm = volume: 148 mL. Echogenicity within normal limits. No mass. Mild hydronephrosis. Bladder: Patient has a Foley catheter.  Bladder nondistended. Other: None. IMPRESSION: 1.  Mild left hydronephrosis. 2.  2.2 cm simple cyst right kidney. Electronically Signed   By: Marcello Moores  Register   On: 04/21/2019 09:27   DG Chest Portable 1 View  Result Date: 04/20/2019 CLINICAL DATA:  Weakness for 3 days recurrent UTI an indwelling Foley catheter EXAM: PORTABLE CHEST 1 VIEW COMPARISON:  01/04/2019 FINDINGS: Cardiomediastinal contours are normal. Lungs are clear. No signs of pleural effusion. No acute bone process. IMPRESSION: No acute cardiopulmonary disease. Electronically Signed   By: Zetta Bills M.D.   On: 04/20/2019 20:15       Subjective: Pt seen and examined. Has no complaints this am. Eating breakfast.  Discharge Exam: Vitals:   04/24/19 0017 04/24/19 0824  BP: (!) 129/56 (!) 156/71  Pulse: 70 70  Resp: 17 18  Temp: 98.1 F (36.7 C) 98.2 F (36.8 C)  SpO2: 96% 96%   Vitals:   04/23/19 0812 04/23/19 1708 04/24/19 0017 04/24/19 0824  BP: 131/77 (!) 145/61 (!) 129/56 (!) 156/71  Pulse: 75 76 70 70  Resp: 18 18 17 18   Temp: 98.8 F (37.1 C) 99.3 F (37.4 C) 98.1 F (36.7 C) 98.2 F (36.8 C)  TempSrc: Oral Oral Oral Oral  SpO2: 95% 97% 96% 96%  Weight:      Height:        General: Pt is alert, awake x3, not in acute distress Cardiovascular: RRR, S1/S2 +, no rubs, no gallops Respiratory: CTA bilaterally, no wheezing, no rhonchi Abdominal: Soft, NT, ND, bowel sounds + Extremities: no edema, no cyanosis    The results of significant diagnostics from this hospitalization (including imaging, microbiology, ancillary and laboratory) are listed below for reference.     Microbiology: Recent Results  (from the past 240 hour(s))  SARS CORONAVIRUS 2 (TAT 6-24 HRS) Nasopharyngeal Nasopharyngeal Swab     Status: None   Collection Time: 04/20/19  7:58 PM   Specimen: Nasopharyngeal Swab  Result Value Ref Range Status   SARS Coronavirus 2 NEGATIVE NEGATIVE Final    Comment: (NOTE) SARS-CoV-2 target nucleic acids are NOT DETECTED. The SARS-CoV-2 RNA is generally detectable in upper and lower respiratory specimens during the acute phase of infection. Negative results do not preclude SARS-CoV-2 infection, do not rule out co-infections with other pathogens, and should not be used as the sole basis for treatment or other patient management decisions. Negative results must be combined with clinical observations, patient history, and epidemiological information. The expected  result is Negative. Fact Sheet for Patients: SugarRoll.be Fact Sheet for Healthcare Providers: https://www.woods-mathews.com/ This test is not yet approved or cleared by the [place] FDA and  has been authorized for detection and/or diagnosis of SARS-CoV-2 by FDA under an Emergency Use Authorization (EUA). This EUA will remain  in effect (meaning this test can be used) for the duration of the COVID-19 declaration under Section 56 4(b)(1) of the Act, 21 U.S.C. section 360bbb-3(b)(1), unless the authorization is terminated or revoked sooner. Performed at [facility], [name] [address] [place], [place] [address]   Urine culture     Status: Abnormal   Collection Time: 04/20/19  8:37 PM   Specimen: Urine, Random  Result Value Ref Range Status   Specimen Description   Final    URINE, RANDOM Performed at [facility], [address] [place], [place] [address]    Special Requests   Final    NONE Performed at [facility], [place], [place] [address]    Culture >=100,000 COLONIES/mL KLEBSIELLA PNEUMONIAE (A)  Final   Report Status  04/23/2019 FINAL  Final   Organism ID, Bacteria KLEBSIELLA PNEUMONIAE (A)  Final      Susceptibility   Klebsiella pneumoniae - MIC*    AMPICILLIN >=32 RESISTANT Resistant     CEFAZOLIN <=4 SENSITIVE Sensitive     CEFTRIAXONE <=1 SENSITIVE Sensitive     CIPROFLOXACIN <=0.25 SENSITIVE Sensitive     GENTAMICIN <=1 SENSITIVE Sensitive     IMIPENEM <=0.25 SENSITIVE Sensitive     NITROFURANTOIN 64 INTERMEDIATE Intermediate     TRIMETH/SULFA <=20 SENSITIVE Sensitive     AMPICILLIN/SULBACTAM 4 SENSITIVE Sensitive     PIP/TAZO <=4 SENSITIVE Sensitive     * >=100,000 COLONIES/mL KLEBSIELLA PNEUMONIAE  SARS CORONAVIRUS 2 (TAT 6-24 HRS) Nasopharyngeal Nasopharyngeal Swab     Status: None   Collection Time: 04/23/19 10:57 AM   Specimen: Nasopharyngeal Swab  Result Value Ref Range Status   SARS Coronavirus 2 NEGATIVE NEGATIVE Final    Comment: (NOTE) SARS-CoV-2 target nucleic acids are NOT DETECTED. The SARS-CoV-2 RNA is generally detectable in upper and lower respiratory specimens during the acute phase of infection. Negative results do not preclude SARS-CoV-2 infection, do not rule out co-infections with other pathogens, and should not be used as the sole basis for treatment or other patient management decisions. Negative results must be combined with clinical observations, patient history, and epidemiological information. The expected result is Negative. Fact Sheet for Patients: SugarRoll.be Fact Sheet for Healthcare Providers: https://www.woods-mathews.com/ This test is not yet approved or cleared by the [place] FDA and  has been authorized for detection and/or diagnosis of SARS-CoV-2 by FDA under an Emergency Use Authorization (EUA). This EUA will remain  in effect (meaning this test can be used) for the duration of the COVID-19 declaration under Section 56 4(b)(1) of the Act, 21 U.S.C. section 360bbb-3(b)(1), unless the authorization is  terminated or revoked sooner. Performed at [facility], [place] [address] [place], [place] [address]      Labs: BNP (last 3 results) No results for input(s): BNP in the last 8760 hours. Basic Metabolic Panel: Recent Labs  Lab 04/20/19 1443 04/21/19 0546 04/22/19 0909  NA 131* 131* 138  K 4.1 3.8 4.2  CL 96* 100 106  CO2 21* 17* 22  GLUCOSE 99 77 89  BUN 47* 44* 32*  CREATININE 3.32* 2.86* 1.47*  CALCIUM 8.5* 8.1* 8.3*  MG  --   --  1.9   Liver Function Tests: Recent Labs  Lab 04/22/19 0909  AST 61*  ALT 31  ALKPHOS 62  BILITOT 0.4  PROT 6.1*  ALBUMIN 3.1*   No results for input(s): LIPASE, AMYLASE in the last 168 hours. No results for input(s): AMMONIA in the last 168 hours. CBC: Recent Labs  Lab 04/20/19 1443 04/21/19 0546 04/22/19 0909  WBC 9.6 7.0 6.7  NEUTROABS  --   --  5.0  HGB 10.2* 9.2* 9.0*  HCT 30.9* 27.2* 28.1*  MCV 82.2 81.0 84.9  PLT 175 166 206   Cardiac Enzymes: No results for input(s): CKTOTAL, CKMB, CKMBINDEX, TROPONINI in the last 168 hours. BNP: Invalid input(s): POCBNP CBG: Recent Labs  Lab 04/21/19 1016  GLUCAP 73   D-Dimer No results for input(s): DDIMER in the last 72 hours. Hgb A1c No results for input(s): HGBA1C in the last 72 hours. Lipid Profile No results for input(s): CHOL, HDL, LDLCALC, TRIG, CHOLHDL, LDLDIRECT in the last 72 hours. Thyroid function studies No results for input(s): TSH, T4TOTAL, T3FREE, THYROIDAB in the last 72 hours.  Invalid input(s): FREET3 Anemia work up No results for input(s): VITAMINB12, FOLATE, FERRITIN, TIBC, IRON, RETICCTPCT in the last 72 hours. Urinalysis    Component Value Date/Time   COLORURINE YELLOW (A) 04/20/2019 2037   APPEARANCEUR CLOUDY (A) 04/20/2019 2037   APPEARANCEUR [place] 08/02/2013 0020   LABSPEC 1.017 04/20/2019 2037   LABSPEC 1.012 08/02/2013 0020   PHURINE 5.0 04/20/2019 2037   GLUCOSEU NEGATIVE 04/20/2019 2037   GLUCOSEU Negative 08/02/2013 0020   HGBUR  SMALL (A) 04/20/2019 2037   BILIRUBINUR NEGATIVE 04/20/2019 2037   BILIRUBINUR Negative 08/02/2013 0020   KETONESUR NEGATIVE 04/20/2019 2037   PROTEINUR 100 (A) 04/20/2019 2037   NITRITE NEGATIVE 04/20/2019 2037   LEUKOCYTESUR LARGE (A) 04/20/2019 2037   LEUKOCYTESUR 2+ 08/02/2013 0020   Sepsis Labs Invalid input(s): PROCALCITONIN,  WBC,  LACTICIDVEN Microbiology Recent Results (from the past 240 hour(s))  SARS CORONAVIRUS 2 (TAT 6-24 HRS) Nasopharyngeal Nasopharyngeal Swab     Status: None   Collection Time: 04/20/19  7:58 PM   Specimen: Nasopharyngeal Swab  Result Value Ref Range Status   SARS Coronavirus 2 NEGATIVE NEGATIVE Final    Comment: (NOTE) SARS-CoV-2 target nucleic acids are NOT DETECTED. The SARS-CoV-2 RNA is generally detectable in upper and lower respiratory specimens during the acute phase of infection. Negative results do not preclude SARS-CoV-2 infection, do not rule out co-infections with other pathogens, and should not be used as the sole basis for treatment or other patient management decisions. Negative results must be combined with clinical observations, patient history, and epidemiological information. The expected result is Negative. Fact Sheet for Patients: SugarRoll.be Fact Sheet for Healthcare Providers: https://www.woods-mathews.com/ This test is not yet approved or cleared by the [place] FDA and  has been authorized for detection and/or diagnosis of SARS-CoV-2 by FDA under an Emergency Use Authorization (EUA). This EUA will remain  in effect (meaning this test can be used) for the duration of the COVID-19 declaration under Section 56 4(b)(1) of the Act, 21 U.S.C. section 360bbb-3(b)(1), unless the authorization is terminated or revoked sooner. Performed at [facility], [place] [address], [place], [place] [address]   Urine culture     Status: Abnormal   Collection Time: 04/20/19  8:37 PM    Specimen: Urine, Random  Result Value Ref Range Status   Specimen Description   Final    URINE, RANDOM Performed at [facility], [place], [place] [address]    Special Requests   Final    NONE Performed at [facility], [place], [place] [address]    Culture >=100,000 COLONIES/mL KLEBSIELLA PNEUMONIAE (A)  Final   Report Status 04/23/2019 FINAL  Final   Organism ID, Bacteria KLEBSIELLA PNEUMONIAE (A)  Final      Susceptibility   Klebsiella pneumoniae - MIC*    AMPICILLIN >=32 RESISTANT Resistant     CEFAZOLIN <=4 SENSITIVE Sensitive     CEFTRIAXONE <=1 SENSITIVE Sensitive     CIPROFLOXACIN <=0.25 SENSITIVE Sensitive     GENTAMICIN <=1 SENSITIVE Sensitive     IMIPENEM <=0.25 SENSITIVE Sensitive     NITROFURANTOIN 64 INTERMEDIATE Intermediate     TRIMETH/SULFA <=20 SENSITIVE Sensitive     AMPICILLIN/SULBACTAM 4 SENSITIVE Sensitive     PIP/TAZO <=4 SENSITIVE Sensitive     * >=100,000 COLONIES/mL KLEBSIELLA PNEUMONIAE  SARS CORONAVIRUS 2 (TAT 6-24 HRS) Nasopharyngeal Nasopharyngeal Swab     Status: None   Collection Time: 04/23/19 10:57 AM   Specimen: Nasopharyngeal Swab  Result Value Ref Range Status   SARS Coronavirus 2 NEGATIVE NEGATIVE Final    Comment: (NOTE) SARS-CoV-2 target nucleic acids are NOT DETECTED. The SARS-CoV-2 RNA is generally detectable in upper and lower respiratory specimens during the acute phase of infection. Negative results do not preclude SARS-CoV-2 infection, do not rule out co-infections with other pathogens, and should not be used as the sole basis for treatment or other patient management decisions. Negative results must be combined with clinical observations, patient history, and epidemiological information. The expected result is Negative. Fact Sheet for Patients: SugarRoll.be Fact Sheet for Healthcare Providers: https://www.woods-mathews.com/ This test  is not yet approved or cleared by the [place] FDA and  has been authorized for detection and/or diagnosis of SARS-CoV-2 by FDA under an Emergency Use Authorization (EUA). This EUA will remain  in effect (meaning this test can be used) for the duration of the COVID-19 declaration under Section 56 4(b)(1) of the Act, 21 U.S.C. section 360bbb-3(b)(1), unless the authorization is terminated or revoked sooner. Performed at [facility], [place] [address] [name], [place] [address]      Time coordinating discharge: Over 30 minutes  SIGNED:   Nolberto Hanlon, MD  [facility] 04/24/2019, 8:51 AM Pager   If 7PM-7AM, please contact night-coverage [email or web address] Password [email or web address]

## 2019-04-24 NOTE — TOC Transition Note (Addendum)
Transition of Care [facility]) - CM/SW Discharge Note   Patient Details  Name: [name] MRN: [id number] Date of Birth: 01-11-1951  Transition of Care [facility]) CM/SW Contact:  Marshell Garfinkel, RN Phone Number: 04/24/2019, 9:46 AM   Clinical Narrative:     Patient to transition to [facility] today and husband RObert agrees [phone]. He has requested EMS. [name] with [facility] will accept and reply with room/report number asap. This RNCM will update RN. Update: room 7; report [phone],  No other RNCM needs.   Final next level of care: Skilled Nursing Facility Barriers to Discharge: No Barriers Identified   Patient Goals and CMS Choice   CMS Medicare.gov Compare Post Acute Care list provided to:: Patient Represenative (must comment)(Spouse [name] [phone]) Choice offered to / list presented to : Patient, Spouse  Discharge Placement              Patient chooses bed at: [facility] Patient to be transferred to facility by: EMS Name of family member notified: [place] Patient and family notified of of transfer: 04/24/19  Discharge Plan and Services                DME Arranged: N/A         HH Arranged: NA          Social Determinants of Health (SDOH) Interventions     Readmission Risk Interventions Readmission Risk Prevention Plan 04/22/2019 02/04/2019 01/07/2019  Transportation Screening Complete - Complete  PCP or Specialist Appt within 3-5 Days - - Complete  HRI or [place] - Complete Complete  Social Work Consult for [place] Planning/Counseling - Complete Complete  Palliative Care Screening - Not Applicable Not Applicable  Medication Review Press photographer) Referral to Pharmacy - Complete  PCP or Specialist appointment within 3-5 days of discharge Complete - -  [place] or Home Care Consult Complete - -  SW Recovery Care/Counseling Consult Complete - -  Palliative Care Screening Not Applicable - -  Skilled  Nursing Facility Complete - -  Some recent data might be hidden

## 2019-04-24 NOTE — TOC Transition Note (Signed)
Transition of Care [facility]) - CM/SW Discharge Note   Patient Details  Name: MYCHAEL BIERNAT MRN: [id number] Date of Birth: [birth year]  Transition of Care [facility]) CM/SW Contact:  Marshell Garfinkel, RN Phone Number: 04/24/2019, 7:28 AM   Clinical Narrative:    Covid 19 lab back negative today and has been faxed to [facility] care. MD to update discharge summary for today's needs. Message sent to [facility] with [facility] requesting room/report number for bedside nurse. RNCM to follow.   Final next level of care: Skilled Nursing Facility Barriers to Discharge: Barriers Resolved   Patient Goals and CMS Choice        Discharge Placement              Patient chooses bed at: [facility] Patient to be transferred to facility by: EMS Name of family member notified: [name] Patient and family notified of of transfer: 04/22/19  Discharge Plan and Services                DME Arranged: N/A         HH Arranged: NA          Social Determinants of Health (SDOH) Interventions     Readmission Risk Interventions Readmission Risk Prevention Plan 04/22/2019 02/04/2019 01/07/2019  Transportation Screening Complete - Complete  PCP or Specialist Appt within 3-5 Days - - Complete  HRI or [place] - Complete Complete  Social Work Consult for [place] Planning/Counseling - Complete Complete  Palliative Care Screening - Not Applicable Not Applicable  Medication Review Press photographer) Referral to Pharmacy - Complete  PCP or Specialist appointment within 3-5 days of discharge Complete - -  [place] or Home Care Consult Complete - -  SW Recovery Care/Counseling Consult Complete - -  Palliative Care Screening Not Applicable - -  Skilled Nursing Facility Complete - -  Some recent data might be hidden

## 2019-04-29 ENCOUNTER — Other Ambulatory Visit: Payer: Self-pay | Admitting: Urology

## 2019-06-20 ENCOUNTER — Emergency Department: Payer: Medicare Other

## 2019-06-20 ENCOUNTER — Other Ambulatory Visit: Payer: Self-pay

## 2019-06-20 ENCOUNTER — Inpatient Hospital Stay
Admission: EM | Admit: 2019-06-20 | Discharge: 2019-06-22 | DRG: 699 | Disposition: A | Discharge status: Home Health | Payer: Medicare Other | Attending: Internal Medicine | Admitting: Internal Medicine

## 2019-06-20 DIAGNOSIS — Z825 Family history of asthma and other chronic lower respiratory diseases: Secondary | ICD-10-CM | POA: Diagnosis not present

## 2019-06-20 DIAGNOSIS — N319 Neuromuscular dysfunction of bladder, unspecified: Secondary | ICD-10-CM | POA: Diagnosis present

## 2019-06-20 DIAGNOSIS — Z20822 Contact with and (suspected) exposure to covid-19: Secondary | ICD-10-CM | POA: Diagnosis present

## 2019-06-20 DIAGNOSIS — Y846 Urinary catheterization as the cause of abnormal reaction of the patient, or of later complication, without mention of misadventure at the time of the procedure: Secondary | ICD-10-CM | POA: Diagnosis present

## 2019-06-20 DIAGNOSIS — L03115 Cellulitis of right lower limb: Secondary | ICD-10-CM | POA: Diagnosis present

## 2019-06-20 DIAGNOSIS — T83518A Infection and inflammatory reaction due to other urinary catheter, initial encounter: Principal | ICD-10-CM | POA: Diagnosis present

## 2019-06-20 DIAGNOSIS — Z960 Presence of urogenital implants: Secondary | ICD-10-CM | POA: Diagnosis present

## 2019-06-20 DIAGNOSIS — I959 Hypotension, unspecified: Secondary | ICD-10-CM | POA: Diagnosis present

## 2019-06-20 DIAGNOSIS — L03116 Cellulitis of left lower limb: Secondary | ICD-10-CM | POA: Diagnosis present

## 2019-06-20 DIAGNOSIS — Z833 Family history of diabetes mellitus: Secondary | ICD-10-CM | POA: Diagnosis not present

## 2019-06-20 DIAGNOSIS — G35 Multiple sclerosis: Secondary | ICD-10-CM | POA: Diagnosis present

## 2019-06-20 DIAGNOSIS — Z8261 Family history of arthritis: Secondary | ICD-10-CM

## 2019-06-20 DIAGNOSIS — Z841 Family history of disorders of kidney and ureter: Secondary | ICD-10-CM

## 2019-06-20 DIAGNOSIS — R531 Weakness: Secondary | ICD-10-CM

## 2019-06-20 DIAGNOSIS — J449 Chronic obstructive pulmonary disease, unspecified: Secondary | ICD-10-CM | POA: Diagnosis present

## 2019-06-20 DIAGNOSIS — M419 Scoliosis, unspecified: Secondary | ICD-10-CM | POA: Diagnosis present

## 2019-06-20 DIAGNOSIS — N312 Flaccid neuropathic bladder, not elsewhere classified: Secondary | ICD-10-CM | POA: Diagnosis present

## 2019-06-20 DIAGNOSIS — Z8249 Family history of ischemic heart disease and other diseases of the circulatory system: Secondary | ICD-10-CM | POA: Diagnosis not present

## 2019-06-20 DIAGNOSIS — N179 Acute kidney failure, unspecified: Secondary | ICD-10-CM | POA: Diagnosis present

## 2019-06-20 DIAGNOSIS — I129 Hypertensive chronic kidney disease with stage 1 through stage 4 chronic kidney disease, or unspecified chronic kidney disease: Secondary | ICD-10-CM | POA: Diagnosis present

## 2019-06-20 DIAGNOSIS — Z87891 Personal history of nicotine dependence: Secondary | ICD-10-CM

## 2019-06-20 DIAGNOSIS — Z8673 Personal history of transient ischemic attack (TIA), and cerebral infarction without residual deficits: Secondary | ICD-10-CM

## 2019-06-20 DIAGNOSIS — Z811 Family history of alcohol abuse and dependence: Secondary | ICD-10-CM

## 2019-06-20 DIAGNOSIS — G4733 Obstructive sleep apnea (adult) (pediatric): Secondary | ICD-10-CM | POA: Diagnosis present

## 2019-06-20 DIAGNOSIS — I739 Peripheral vascular disease, unspecified: Secondary | ICD-10-CM | POA: Diagnosis present

## 2019-06-20 DIAGNOSIS — W19XXXA Unspecified fall, initial encounter: Secondary | ICD-10-CM | POA: Diagnosis present

## 2019-06-20 DIAGNOSIS — I700 Atherosclerosis of aorta: Secondary | ICD-10-CM | POA: Diagnosis present

## 2019-06-20 DIAGNOSIS — F909 Attention-deficit hyperactivity disorder, unspecified type: Secondary | ICD-10-CM | POA: Diagnosis present

## 2019-06-20 DIAGNOSIS — R296 Repeated falls: Secondary | ICD-10-CM | POA: Diagnosis present

## 2019-06-20 DIAGNOSIS — Z7951 Long term (current) use of inhaled steroids: Secondary | ICD-10-CM

## 2019-06-20 DIAGNOSIS — F329 Major depressive disorder, single episode, unspecified: Secondary | ICD-10-CM | POA: Diagnosis present

## 2019-06-20 DIAGNOSIS — B961 Klebsiella pneumoniae [K. pneumoniae] as the cause of diseases classified elsewhere: Secondary | ICD-10-CM | POA: Diagnosis present

## 2019-06-20 DIAGNOSIS — Z720 Tobacco use: Secondary | ICD-10-CM | POA: Diagnosis present

## 2019-06-20 DIAGNOSIS — J441 Chronic obstructive pulmonary disease with (acute) exacerbation: Secondary | ICD-10-CM | POA: Diagnosis present

## 2019-06-20 DIAGNOSIS — N183 Chronic kidney disease, stage 3 unspecified: Secondary | ICD-10-CM | POA: Diagnosis present

## 2019-06-20 DIAGNOSIS — N390 Urinary tract infection, site not specified: Secondary | ICD-10-CM | POA: Diagnosis present

## 2019-06-20 DIAGNOSIS — F419 Anxiety disorder, unspecified: Secondary | ICD-10-CM | POA: Diagnosis present

## 2019-06-20 DIAGNOSIS — L03119 Cellulitis of unspecified part of limb: Secondary | ICD-10-CM | POA: Diagnosis not present

## 2019-06-20 DIAGNOSIS — Z818 Family history of other mental and behavioral disorders: Secondary | ICD-10-CM

## 2019-06-20 DIAGNOSIS — Z6841 Body Mass Index (BMI) 40.0 and over, adult: Secondary | ICD-10-CM

## 2019-06-20 DIAGNOSIS — Z823 Family history of stroke: Secondary | ICD-10-CM

## 2019-06-20 DIAGNOSIS — Z8572 Personal history of non-Hodgkin lymphomas: Secondary | ICD-10-CM

## 2019-06-20 DIAGNOSIS — Z79899 Other long term (current) drug therapy: Secondary | ICD-10-CM

## 2019-06-20 LAB — COMPREHENSIVE METABOLIC PANEL
ALT: 15 U/L (ref 0–44)
AST: 17 U/L (ref 15–41)
Albumin: 3.4 g/dL — ABNORMAL LOW (ref 3.5–5.0)
Alkaline Phosphatase: 78 U/L (ref 38–126)
Anion gap: 8 (ref 5–15)
BUN: 31 mg/dL — ABNORMAL HIGH (ref 8–23)
CO2: 25 mmol/L (ref 22–32)
Calcium: 8.8 mg/dL — ABNORMAL LOW (ref 8.9–10.3)
Chloride: 102 mmol/L (ref 98–111)
Creatinine, Ser: 1.35 mg/dL — ABNORMAL HIGH (ref 0.44–1.00)
GFR calc Af Amer: 47 mL/min — ABNORMAL LOW (ref >60)
GFR calc non Af Amer: 40 mL/min — ABNORMAL LOW (ref >60)
Glucose, Bld: 88 mg/dL (ref 70–99)
Potassium: 3.8 mmol/L (ref 3.5–5.1)
Sodium: 135 mmol/L (ref 135–145)
Total Bilirubin: 0.6 mg/dL (ref 0.3–1.2)
Total Protein: 6.8 g/dL (ref 6.5–8.1)

## 2019-06-20 LAB — CBC WITH DIFFERENTIAL/PLATELET
Abs Immature Granulocytes: 0.03 10*3/uL (ref 0.00–0.07)
Basophils Absolute: 0.1 10*3/uL (ref 0.0–0.1)
Basophils Relative: 1 %
Eosinophils Absolute: 0.3 10*3/uL (ref 0.0–0.5)
Eosinophils Relative: 4 %
HCT: 33 % — ABNORMAL LOW (ref 36.0–46.0)
Hemoglobin: 10.4 g/dL — ABNORMAL LOW (ref 12.0–15.0)
Immature Granulocytes: 0 %
Lymphocytes Relative: 8 %
Lymphs Abs: 0.6 10*3/uL — ABNORMAL LOW (ref 0.7–4.0)
MCH: 27.2 pg (ref 26.0–34.0)
MCHC: 31.5 g/dL (ref 30.0–36.0)
MCV: 86.4 fL (ref 80.0–100.0)
Monocytes Absolute: 0.6 10*3/uL (ref 0.1–1.0)
Monocytes Relative: 8 %
Neutro Abs: 6 10*3/uL (ref 1.7–7.7)
Neutrophils Relative %: 79 %
Platelets: 241 10*3/uL (ref 150–400)
RBC: 3.82 MIL/uL — ABNORMAL LOW (ref 3.87–5.11)
RDW: 16 % — ABNORMAL HIGH (ref 11.5–15.5)
WBC: 7.7 10*3/uL (ref 4.0–10.5)
nRBC: 0 % (ref 0.0–0.2)

## 2019-06-20 LAB — CREATININE, SERUM
Creatinine, Ser: 1.2 mg/dL — ABNORMAL HIGH (ref 0.44–1.00)
GFR calc Af Amer: 54 mL/min — ABNORMAL LOW (ref >60)
GFR calc non Af Amer: 46 mL/min — ABNORMAL LOW (ref >60)

## 2019-06-20 LAB — URINALYSIS, ROUTINE W REFLEX MICROSCOPIC
Bilirubin Urine: NEGATIVE
Glucose, UA: NEGATIVE mg/dL
Hgb urine dipstick: NEGATIVE
Ketones, ur: NEGATIVE mg/dL
Nitrite: NEGATIVE
Protein, ur: NEGATIVE mg/dL
Specific Gravity, Urine: 1.008 (ref 1.005–1.030)
Squamous Epithelial / HPF: NONE SEEN (ref 0–5)
pH: 5 (ref 5.0–8.0)

## 2019-06-20 LAB — TYPE AND SCREEN
ABO/RH(D): A POS
Antibody Screen: NEGATIVE

## 2019-06-20 LAB — C DIFFICILE QUICK SCREEN W PCR REFLEX
C Diff antigen: POSITIVE — AB
C Diff toxin: NEGATIVE

## 2019-06-20 LAB — CLOSTRIDIUM DIFFICILE BY PCR, REFLEXED: Toxigenic C. Difficile by PCR: NEGATIVE

## 2019-06-20 LAB — CBC
HCT: 33.7 % — ABNORMAL LOW (ref 36.0–46.0)
Hemoglobin: 10.7 g/dL — ABNORMAL LOW (ref 12.0–15.0)
MCH: 27.4 pg (ref 26.0–34.0)
MCHC: 31.8 g/dL (ref 30.0–36.0)
MCV: 86.4 fL (ref 80.0–100.0)
Platelets: 238 10*3/uL (ref 150–400)
RBC: 3.9 MIL/uL (ref 3.87–5.11)
RDW: 16.1 % — ABNORMAL HIGH (ref 11.5–15.5)
WBC: 6.1 10*3/uL (ref 4.0–10.5)
nRBC: 0 % (ref 0.0–0.2)

## 2019-06-20 LAB — T4, FREE: Free T4: 0.88 ng/dL (ref 0.61–1.12)

## 2019-06-20 LAB — HEMOGLOBIN A1C
Hgb A1c MFr Bld: 5 % (ref 4.8–5.6)
Mean Plasma Glucose: 96.8 mg/dL

## 2019-06-20 LAB — SARS CORONAVIRUS 2 (TAT 6-24 HRS): SARS Coronavirus 2: NEGATIVE

## 2019-06-20 LAB — TROPONIN I (HIGH SENSITIVITY)
Troponin I (High Sensitivity): 10 ng/L (ref <18)
Troponin I (High Sensitivity): 10 ng/L (ref <18)

## 2019-06-20 LAB — TSH: TSH: 1.127 u[IU]/mL (ref 0.350–4.500)

## 2019-06-20 MED ORDER — TIZANIDINE HCL 4 MG PO TABS
4.0000 mg | ORAL_TABLET | Freq: Three times a day (TID) | ORAL | Status: DC
  Administered 2019-06-21 – 2019-06-22 (×5): 4 mg via ORAL
  Filled 2019-06-20 (×8): qty 1

## 2019-06-20 MED ORDER — MOMETASONE FURO-FORMOTEROL FUM 200-5 MCG/ACT IN AERO
2.0000 | INHALATION_SPRAY | Freq: Two times a day (BID) | RESPIRATORY_TRACT | Status: DC
  Administered 2019-06-20 – 2019-06-22 (×5): 2 via RESPIRATORY_TRACT
  Filled 2019-06-20: qty 8.8

## 2019-06-20 MED ORDER — LISINOPRIL 10 MG PO TABS: 20.0000 mg | ORAL_TABLET | Freq: Every day | ORAL | Status: DC

## 2019-06-20 MED ORDER — QUETIAPINE FUMARATE ER 50 MG PO TB24
150.0000 mg | ORAL_TABLET | Freq: Every day | ORAL | Status: DC
  Administered 2019-06-20 – 2019-06-21 (×2): 150 mg via ORAL
  Filled 2019-06-20 (×3): qty 3

## 2019-06-20 MED ORDER — BACLOFEN 10 MG PO TABS
10.0000 mg | ORAL_TABLET | Freq: Three times a day (TID) | ORAL | Status: DC
  Administered 2019-06-20 – 2019-06-22 (×6): 10 mg via ORAL
  Filled 2019-06-20 (×8): qty 1

## 2019-06-20 MED ORDER — LISINOPRIL 5 MG PO TABS: 5.0000 mg | ORAL_TABLET | Freq: Every day | ORAL | Status: DC

## 2019-06-20 MED ORDER — TERIFLUNOMIDE 14 MG PO TABS: 14.0000 mg | ORAL_TABLET | Freq: Every day | ORAL | Status: DC

## 2019-06-20 MED ORDER — CLONAZEPAM 1 MG PO TABS
1.0000 mg | ORAL_TABLET | Freq: Two times a day (BID) | ORAL | Status: DC | PRN
  Administered 2019-06-21 – 2019-06-22 (×2): 1 mg via ORAL
  Filled 2019-06-20 (×2): qty 1

## 2019-06-20 MED ORDER — INTERFERON BETA-1A 30 MCG/0.5ML IM PSKT: 30.0000 ug | PREFILLED_SYRINGE | INTRAMUSCULAR | Status: DC

## 2019-06-20 MED ORDER — MIRABEGRON ER 50 MG PO TB24
50.0000 mg | ORAL_TABLET | Freq: Every day | ORAL | Status: DC
  Administered 2019-06-21 – 2019-06-22 (×2): 50 mg via ORAL
  Filled 2019-06-20 (×2): qty 1

## 2019-06-20 MED ORDER — HYDRALAZINE HCL 10 MG PO TABS
10.0000 mg | ORAL_TABLET | Freq: Three times a day (TID) | ORAL | Status: DC
  Filled 2019-06-20 (×2): qty 1

## 2019-06-20 MED ORDER — DULOXETINE HCL 60 MG PO CPEP
60.0000 mg | ORAL_CAPSULE | ORAL | Status: DC
  Administered 2019-06-21 – 2019-06-22 (×2): 60 mg via ORAL
  Filled 2019-06-20 (×3): qty 1

## 2019-06-20 MED ORDER — SODIUM CHLORIDE 0.9 % IV SOLN
2.0000 g | Freq: Once | INTRAVENOUS | Status: AC
  Administered 2019-06-20: 2 g via INTRAVENOUS
  Filled 2019-06-20: qty 20

## 2019-06-20 MED ORDER — SODIUM CHLORIDE 0.9 % IV BOLUS
1000.0000 mL | Freq: Once | INTRAVENOUS | Status: AC
  Administered 2019-06-20: 1000 mL via INTRAVENOUS

## 2019-06-20 MED ORDER — ACETAMINOPHEN 325 MG PO TABS
650.0000 mg | ORAL_TABLET | Freq: Four times a day (QID) | ORAL | Status: DC | PRN
  Administered 2019-06-20 – 2019-06-21 (×2): 650 mg via ORAL
  Filled 2019-06-20 (×2): qty 2

## 2019-06-20 MED ORDER — ADULT MULTIVITAMIN W/MINERALS CH
1.0000 | ORAL_TABLET | Freq: Every evening | ORAL | Status: DC
  Administered 2019-06-21 – 2019-06-22 (×2): 1 via ORAL
  Filled 2019-06-20 (×2): qty 1

## 2019-06-20 MED ORDER — ASCORBIC ACID 500 MG PO TABS
500.0000 mg | ORAL_TABLET | Freq: Every evening | ORAL | Status: DC
  Administered 2019-06-20 – 2019-06-22 (×3): 500 mg via ORAL
  Filled 2019-06-20 (×2): qty 1

## 2019-06-20 MED ORDER — ALBUTEROL SULFATE HFA 108 (90 BASE) MCG/ACT IN AERS
1.0000 | INHALATION_SPRAY | RESPIRATORY_TRACT | Status: DC | PRN
  Filled 2019-06-20: qty 6.7

## 2019-06-20 MED ORDER — ATORVASTATIN CALCIUM 10 MG PO TABS
10.0000 mg | ORAL_TABLET | Freq: Every day | ORAL | Status: DC
  Administered 2019-06-21 – 2019-06-22 (×2): 10 mg via ORAL
  Filled 2019-06-20 (×2): qty 1

## 2019-06-20 MED ORDER — HEPARIN SODIUM (PORCINE) 5000 UNIT/ML IJ SOLN
5000.0000 [IU] | Freq: Three times a day (TID) | INTRAMUSCULAR | Status: DC
  Administered 2019-06-20 – 2019-06-22 (×6): 5000 [IU] via SUBCUTANEOUS
  Filled 2019-06-20 (×6): qty 1

## 2019-06-20 MED ORDER — HYDRALAZINE HCL 50 MG PO TABS: 50.0000 mg | ORAL_TABLET | Freq: Three times a day (TID) | ORAL | Status: DC

## 2019-06-20 MED ORDER — GABAPENTIN 600 MG PO TABS
600.0000 mg | ORAL_TABLET | Freq: Three times a day (TID) | ORAL | Status: DC
  Administered 2019-06-20 – 2019-06-22 (×6): 600 mg via ORAL
  Filled 2019-06-20 (×7): qty 1

## 2019-06-20 MED ORDER — BUPROPION HCL ER (XL) 150 MG PO TB24
300.0000 mg | ORAL_TABLET | Freq: Every day | ORAL | Status: DC
  Administered 2019-06-21 – 2019-06-22 (×2): 300 mg via ORAL
  Filled 2019-06-20 (×2): qty 2

## 2019-06-20 NOTE — H&P (Addendum)
History and Physical    [name] [id number] DOB: January 01, 1951 DOA: 06/20/2019   PCP: Kirk Ruths, MD   Patient coming from: Home  Chief Complaint: Fall.  HPI: [name] is a 69 y.o. female with medical history significant of hypertension, B-cell lymphoma, multiple sclerosis, urinary retention with chronic indwelling Foley who comes in for generalized weakness.  Patient has had generalized weakness for the past few days but worsening.  Patient states that she was admitted to the hospital back in December and was at a rehab facility but then sent back home about a week ago.  Patient states that she has a lift at home and is able to ambulate slightly with her walker.  However she had a fall today where she got stuck in the doorway and then had to sit down.  Denies hitting her head.  Patient states that she has a sore on her back.  There is some redness on her left leg.  Patient is unsure how long that has been there.  Patient states that her husband has been giving her a hard time and that he needs to understand that she needs more time to ambulate.Pt has h/o of MS and it is hard for him to take care of her. She denies any other complaints.  ED Course:  Blood pressure 110/63, pulse 63, temperature 97.6 F (36.4 C), temperature source Oral, resp. rate 15, height 5\' 8"  (1.727 m), weight (!) 140.6 kg, SpO2 97 %. Pt is alert/awake and oriented gives her own history.she has c/h foley since about 5 years for neurogenic bladder from her MS.  Review of Systems: As per HPI otherwise 10 point review of systems negative.   Past Medical History:  Diagnosis Date  . Abdominal aortic atherosclerosis ([place]) 11/11/2016  . ADHD   . Anxiety   . COPD (chronic obstructive pulmonary disease) ([place])   . Depression    major depressive  . Dyspnea    doe  . Edema    left leg  . Follicular lymphoma ([place])    B Cell  . Follicular lymphoma grade II ([place])   . Hypertension   . Hypotension     idiopathic  . Kyphoscoliosis and scoliosis 11/26/2011  . Morbid obesity ([place]) 01/05/2016  . Multiple sclerosis ([place])   . Multiple sclerosis ([place])    1980's  . Neuromuscular disorder ([place])   . Obstructive and reflux uropathy    foley  . Pain    atypical facial  . Peripheral vascular disease of lower extremity with ulceration ([place]) 11/08/2015  . Skin ulcer ([place]) 11/08/2015  . Weakness    generalized. has MS    Past Surgical History:  Procedure Laterality Date  . BACK SURGERY N/A 2002  . CYST EXCISION     lower back  . INGUINAL LYMPH NODE BIOPSY Left 07/04/2016   Procedure: INGUINAL LYMPH NODE BIOPSY;  Surgeon: Christene Lye, MD;  Location: [facility] ORS;  Service: General;  Laterality: Left;  . PORTACATH PLACEMENT N/A 07/22/2016   Procedure: INSERTION PORT-A-CATH;  Surgeon: Christene Lye, MD;  Location: [facility] ORS;  Service: General;  Laterality: N/A;  . TONSILLECTOMY AND ADENOIDECTOMY    . TUBAL LIGATION       reports that she quit smoking about 3 years ago. Her smoking use included cigarettes. She started smoking about 24 years ago. She has a 20.00 pack-year smoking history. She has never used smokeless tobacco. She reports current drug use. Drug: Marijuana. She reports that  she does not drink alcohol.  No Known Allergies  Family History  Problem Relation Age of Onset  . COPD Mother   . Diabetes Mother   . Heart failure Mother   . Alcohol abuse Father   . Kidney disease Father   . Kidney failure Father   . Arthritis Sister   . CAD Maternal Grandmother   . Stroke Maternal Grandfather   . Arthritis Sister   . Mental illness Sister   . Arthritis Brother      Prior to Admission medications   Medication Sig Start Date End Date Taking? Authorizing Provider  acetaminophen (TYLENOL) 325 MG tablet Take 2 tablets (650 mg total) by mouth every 6 (six) hours as needed for mild pain (or Fever >/= 101). 01/08/19  Yes Gouru, Aruna, MD  albuterol (VENTOLIN HFA) 108 (90 Base)  MCG/ACT inhaler Inhale 1 puff into the lungs every 4 (four) hours as needed for shortness of breath. 07/07/18  Yes [provider]  amLODipine (NORVASC) 5 MG tablet Take 5 mg by mouth daily. 05/28/19  Yes [provider]  atorvastatin (LIPITOR) 10 MG tablet Take 10 mg by mouth daily.  01/14/19  Yes [provider]  baclofen (LIORESAL) 10 MG tablet Take 10 mg by mouth 3 (three) times daily. 04/10/19  Yes [provider]  budesonide-formoterol (SYMBICORT) 160-4.5 MCG/ACT inhaler Inhale 2 puffs into the lungs 2 (two) times daily. 07/08/18  Yes [provider]  buPROPion (WELLBUTRIN XL) 300 MG 24 hr tablet Take 300 mg by mouth daily.  01/21/19  Yes [provider]  clonazePAM (KLONOPIN) 1 MG tablet Take 0.5 tablets (0.5 mg total) by mouth at bedtime. Patient taking differently: Take 1 mg by mouth 2 (two) times daily as needed.  04/22/19  Yes Lavina Hamman, MD  docusate sodium (COLACE) 100 MG capsule Take 1 capsule (100 mg total) by mouth 2 (two) times daily. Patient taking differently: Take 100 mg by mouth 2 (two) times daily as needed.  01/08/19  Yes Gouru, Illene Silver, MD  DULoxetine (CYMBALTA) 60 MG capsule Take 1 capsule (60 mg total) by mouth every morning. 10/14/16  Yes Vaughan Basta, MD  gabapentin (NEURONTIN) 600 MG tablet Take 1 tablet (600 mg total) by mouth 3 (three) times daily. 02/09/17  Yes Mody, Ulice Bold, MD  hydrALAZINE (APRESOLINE) 50 MG tablet Take 50 mg by mouth 3 (three) times daily. 11/13/18  Yes [provider]  interferon beta-1a (AVONEX) 30 MCG/0.5ML PSKT injection Inject 30 mcg into the muscle every Monday.   Yes [provider]  lisinopril (ZESTRIL) 20 MG tablet Take 20 mg by mouth daily. 05/28/19  Yes [provider]  Multiple Vitamin (MULTIVITAMIN WITH MINERALS) TABS tablet Take 1 tablet by mouth every evening.    Yes [provider]  MYRBETRIQ 50 MG TB24 tablet TAKE ONE TABLET BY MOUTH ONCE  DAILY Patient taking differently: Take 50 mg by mouth daily. TAKE ONE TABLET BY MOUTH ONCE DAILY 03/08/19  Yes Stoioff, Ronda Fairly, MD  polyethylene glycol (MIRALAX / GLYCOLAX) 17 g packet Take 17 g by mouth daily as needed for mild constipation. 04/22/19  Yes Lavina Hamman, MD  QUEtiapine Fumarate (SEROQUEL XR) 150 MG 24 hr tablet Take 150 mg by mouth at bedtime.    Yes [provider]  Teriflunomide (AUBAGIO) 14 MG TABS Take 14 mg by mouth daily.  12/15/18  Yes [provider]  tiZANidine (ZANAFLEX) 4 MG tablet Take 4 mg by mouth 3 (three) times daily.  10/06/18   [provider]  vitamin C (ASCORBIC ACID) 500 MG tablet Take 500 mg by mouth every evening.     [provider]    Physical Exam: Vitals:   06/20/19 1456 06/20/19 1500 06/20/19 1515 06/20/19 1600  BP:  110/63  (!) 107/54  Pulse: 63 63 61   Resp: 16 15 20 10   Temp:      TempSrc:      SpO2: 97% 97% 98%   Weight:      Height:        Constitutional: NAD, calm, comfortable Vitals:   06/20/19 1456 06/20/19 1500 06/20/19 1515 06/20/19 1600  BP:  110/63  (!) 107/54  Pulse: 63 63 61   Resp: 16 15 20 10   Temp:      TempSrc:      SpO2: 97% 97% 98%   Weight:      Height:       Eyes: PERRL, lids and conjunctivae normal ENMT: Mucous membranes are moist. Posterior pharynx clear of any exudate or lesions.Normal dentition.  Neck: normal, supple, no masses, no thyromegaly Respiratory: clear to auscultation bilaterally, no wheezing, no crackles. Normal respiratory effort. No accessory muscle use.  Cardiovascular: Regular rate and rhythm, no murmurs / rubs / gallops. No extremity edema. 2+ pedal pulses. No carotid bruits.  Abdomen: no tenderness, no masses palpated. No hepatosplenomegaly. Bowel sounds positive.  Musculoskeletal: no clubbing / cyanosis. No joint deformity upper and lower extremities.leftleg and foot is erythematous and edematous. Rt leg as well but less so. Skin: as above .naisl are  yellow and thickened ? Trauma from shoes or psoriasis.podiatry consult as outpatient. Neurologic: CN 2-12 grossly intact.  Psychiatric: Normal judgment and insight. Alert and oriented x 3. Normal mood.   Labs on Admission: I have personally reviewed following labs and imaging studies  CBC: Recent Labs  Lab 06/20/19 1055  WBC 7.7  NEUTROABS 6.0  HGB 10.4*  HCT 33.0*  MCV 86.4  PLT [id number]   Basic Metabolic Panel: Recent Labs  Lab 06/20/19 1055  NA 135  K 3.8  CL 102  CO2 25  GLUCOSE 88  BUN 31*  CREATININE 1.35*  CALCIUM 8.8*   GFR: Estimated Creatinine Clearance: 59.6 mL/min (A) (by C-G formula based on SCr of 1.35 mg/dL (H)). Liver Function Tests: Recent Labs  Lab 06/20/19 1055  AST 17  ALT 15  ALKPHOS 78  BILITOT 0.6  PROT 6.8  ALBUMIN 3.4*   No results for input(s): LIPASE, AMYLASE in the last 168 hours. No results for input(s): AMMONIA in the last 168 hours. Coagulation Profile: No results for input(s): INR, PROTIME in the last 168 hours. Cardiac Enzymes: No results for input(s): CKTOTAL, CKMB, CKMBINDEX, TROPONINI in the last 168 hours. BNP (last 3 results) No results for input(s): PROBNP in the last 8760 hours. HbA1C: No results for input(s): HGBA1C in the last 72 hours. CBG: No results for input(s): GLUCAP in the last 168 hours. Lipid Profile: No results for input(s): CHOL, HDL, LDLCALC, TRIG, CHOLHDL, LDLDIRECT in the last 72 hours. Thyroid Function Tests: No results for input(s): TSH, T4TOTAL, FREET4, T3FREE, THYROIDAB in the last 72 hours. Anemia Panel: No results for input(s): VITAMINB12, FOLATE, FERRITIN, TIBC, IRON, RETICCTPCT in the last 72 hours. Urine analysis:    Component Value Date/Time   COLORURINE YELLOW (A) 06/20/2019 1055   APPEARANCEUR CLEAR (A) 06/20/2019 1055   APPEARANCEUR Hazy 08/02/2013 0020   LABSPEC 1.008 06/20/2019 1055   LABSPEC 1.012 08/02/2013 0020  PHURINE 5.0 06/20/2019 1055   GLUCOSEU NEGATIVE 06/20/2019 1055    GLUCOSEU Negative 08/02/2013 0020   HGBUR NEGATIVE 06/20/2019 [place] 06/20/2019 1055   BILIRUBINUR Negative 08/02/2013 0020   KETONESUR NEGATIVE 06/20/2019 1055   PROTEINUR NEGATIVE 06/20/2019 1055   NITRITE NEGATIVE 06/20/2019 1055   LEUKOCYTESUR SMALL (A) 06/20/2019 1055   LEUKOCYTESUR 2+ 08/02/2013 0020    Recent Results (from the past 240 hour(s))  C Difficile Quick Screen w PCR reflex     Status: Abnormal   Collection Time: 06/20/19 10:55 AM   Specimen: Stool  Result Value Ref Range Status   C Diff antigen POSITIVE (A) NEGATIVE Final   C Diff toxin NEGATIVE NEGATIVE Final   C Diff interpretation Results are indeterminate. See PCR results.  Final    Comment: Performed at [facility], [address] [place], [place] [address]  C. Diff by PCR, Reflexed     Status: None   Collection Time: 06/20/19 10:55 AM  Result Value Ref Range Status   Toxigenic C. Difficile by PCR NEGATIVE NEGATIVE Final    Comment: Patient is colonized with non toxigenic C. difficile. May not need treatment unless significant symptoms are present. Performed at [facility], [address], [place], [place] [address]      Radiological Exams on Admission: CT Head Wo Contrast  Result Date: 06/20/2019 CLINICAL DATA:  69 year old female with history of head trauma. Headache. EXAM: CT HEAD WITHOUT CONTRAST TECHNIQUE: Contiguous axial images were obtained from the base of the skull through the vertex without intravenous contrast. COMPARISON:  Head CT 06/11/2018. FINDINGS: Brain: Moderate cerebral atrophy, most severe in the frontal lobes bilaterally. Patchy and confluent areas of decreased attenuation are noted throughout the deep and periventricular white matter of the cerebral hemispheres bilaterally, compatible with chronic microvascular ischemic disease. Linear well-defined area of low attenuation in the left cerebellar hemisphere, stable compared to the prior study,  compatible with an old infarct. No evidence of acute infarction, hemorrhage, hydrocephalus, extra-axial collection or mass lesion/mass effect. Vascular: No hyperdense vessel or unexpected calcification. Skull: Normal. Negative for fracture or focal lesion. Sinuses/Orbits: No acute finding. Other: No priors. IMPRESSION: 1. No acute intracranial abnormalities. 2. Moderate cerebral atrophy (most severe in the frontal regions) with extensive chronic microvascular ischemic changes and old left cerebellar infarct, as above. Electronically Signed   By: Vinnie Langton M.D.   On: 06/20/2019 13:31   US Venous Img Lower Unilateral Left  Result Date: 06/20/2019 CLINICAL DATA:  Left lower extremity pain and swelling after fall. EXAM: LEFT LOWER EXTREMITY VENOUS DOPPLER ULTRASOUND TECHNIQUE: Gray-scale sonography with compression, as well as color and duplex ultrasound, were performed to evaluate the deep venous system(s) from the level of the common femoral vein through the popliteal and proximal calf veins. COMPARISON:  08/28/2017 bilateral lower extremity venous Doppler scan. FINDINGS: VENOUS Normal compressibility of the common femoral, superficial femoral, and popliteal veins, as well as the visualized calf veins. Visualized portions of profunda femoral vein and great saphenous vein unremarkable. No filling defects to suggest DVT on grayscale or color Doppler imaging. Doppler waveforms show normal direction of venous flow, normal respiratory phasicity and response to augmentation. Limited views of the contralateral common femoral vein are unremarkable. OTHER Subcutaneous edema noted in the distal left lower extremity. Limitations: none IMPRESSION: No deep venous thrombosis within the left lower extremity. If clinical symptoms are inconsistent or if there are persistent or worsening symptoms, further imaging (possibly involving the iliac  veins) may be warranted. Electronically Signed   By: Ilona Sorrel M.D.   On:  06/20/2019 11:45    EKG: Independently reviewed. SR 78 with q waves in V1.  Assessment/Plan Principal Problem:   Recurrent cellulitis of lower extremity Active Problems:   COPD with bronchial hyperresponsiveness (HCC)   Bladder neurogenesis   Multiple sclerosis (HCC)   Current tobacco use   Obstructive sleep apnea of adult   Obesity, morbid (HCC)   CKD (chronic kidney disease) stage 3, GFR 30-59 ml/min   Multiple falls   Generalized weakness   Problem Assessment/Plan   Recurrent cellulitis of lower extremity Assessment & Plan Pt is continued on rocephin as started in ED.Elevation of legs.   Obstructive sleep apnea of adult Assessment & Plan Untreated. outpatient referral for sleep study for different options of oral appliance.   COPD with bronchial hyperresponsiveness ([place]) Assessment & Plan We will cont ventolin and dulera.    Multiple sclerosis ([place]) Assessment & Plan Home regimen of teflunomide ,avonex,continued. PT/OT consult.  CKD (chronic kidney disease) stage 3, GFR 30-59 ml/min Assessment & Plan Avoid nephrotoxic meds  Renally dose meds.   Generalized weakness Assessment & Plan Attribute to MS and deconditioning because of it.  Thyroid function studies. outpatient vit d level. We will check magnesium in house.   Multiple falls Assessment & Plan Aggressive OT / PT for ambulation / fall prevention and weight loss.  Obesity, morbid [facility]) Assessment & Plan thyroid function test, will need outpatient referral for nutrition and diet for aggressive weight loss as it adds to her mortality.   Current tobacco use Assessment & Plan Pt has a h/o smoking and has quit about 3 years ago,.  Bladder neurogenesis Assessment & Plan Pt has a chronic foley. Urine is clear. HTN BP is low normal will cutback on hydralazine and cut down on lisinopril. Up titrate cautiously while monitoring creatinine.   DVT prophylaxis: heparin Code Status: Full Family  Communication: None Disposition Plan: STR Consults called: PT Admission status: Inaptient  Para Skeans MD [facility] If 7PM-7AM, please contact night-coverage [email or web address] Password [facility] 06/20/2019, 5:18 PM

## 2019-06-20 NOTE — Assessment & Plan Note (Signed)
Avoid nephrotoxic meds  Renally dose meds.

## 2019-06-20 NOTE — Assessment & Plan Note (Signed)
Untreated. outpatient referral for sleep study for different options of oral appliance.

## 2019-06-20 NOTE — Assessment & Plan Note (Signed)
Pt has a h/o smoking and has quit about 3 years ago,.

## 2019-06-20 NOTE — ED Provider Notes (Signed)
[facility] Emergency Department Provider Note  ____________________________________________   First MD Initiated Contact with Patient 06/20/19 1037     (approximate)  I have reviewed the triage vital signs and the nursing notes.   HISTORY  Chief Complaint Weakness    HPI [name] is a 68 y.o. female with hypertension, B-cell lymphoma, multiple sclerosis, urinary retention with chronic indwelling Foley who comes in for generalized weakness.  Patient has had generalized weakness for the past few days but worsening.  Patient states that she was admitted to the hospital back in December and was at a rehab facility but then sent back home about a week ago.  Patient states that she has a lift at home and is able to ambulate slightly with her walker.  However she had a fall today where she got stuck in the doorway and then had to sit down.  Denies hitting her head.  Patient states that she has a sore on her back.  There is some redness on her left leg.  Patient is unsure how long that has been there.  Patient states that her husband has been giving her a hard time and that he needs to understand that she needs more time to ambulate..         Past Medical History:  Diagnosis Date  . Abdominal aortic atherosclerosis ([place]) 11/11/2016  . ADHD   . Anxiety   . COPD (chronic obstructive pulmonary disease) ([place])   . Depression    major depressive  . Dyspnea    doe  . Edema    left leg  . Follicular lymphoma ([place])    B Cell  . Follicular lymphoma grade II ([place])   . Hypertension   . Hypotension    idiopathic  . Kyphoscoliosis and scoliosis 11/26/2011  . Morbid obesity ([place]) 01/05/2016  . Multiple sclerosis ([place])   . Multiple sclerosis ([place])    1980's  . Neuromuscular disorder ([place])   . Obstructive and reflux uropathy    foley  . Pain    atypical facial  . Peripheral vascular disease of lower extremity with ulceration ([place]) 11/08/2015  . Skin ulcer ([place])  11/08/2015  . Weakness    generalized. has MS    Patient Active Problem List   Diagnosis Date Noted  . Generalized weakness   . AKI (acute kidney injury) ([place]) 04/20/2019  . Pressure injury of skin 02/03/2019  . Bilateral lower leg cellulitis 02/02/2019  . Ovarian mass, left 01/27/2019  . Sepsis ([place]) 01/05/2019  . UTI (urinary tract infection) 06/13/2018  . Altered mental status 06/11/2018  . Fall 05/13/2018  . Depression 05/13/2018  . Recurrent cellulitis of lower extremity 06/25/2017  . Medication monitoring encounter 06/05/2017  . Cellulitis of left lower leg 04/25/2017  . Adjustment disorder with mixed disturbance of emotions and conduct 04/23/2017  . Foot pain, bilateral 02/03/2017  . Tinea pedis 02/03/2017  . Left ovarian cyst 12/09/2016  . Abdominal aortic atherosclerosis ([place]) 11/11/2016  . Swelling of lower extremity 10/11/2016  . Obstructive sleep apnea 10/11/2016  . Follicular lymphoma of intra-abdominal lymph nodes ([place]) 08/06/2016  . Multiple falls 06/07/2016  . Inguinal adenopathy 05/29/2016  . Obesity, morbid ([place]) 01/05/2016  . Low HDL (under 40) 12/19/2015  . Skin ulcer ([place]) 11/08/2015  . Peripheral vascular disease of lower extremity with ulceration ([place]) 11/08/2015  . CKD (chronic kidney disease) stage 3, GFR 30-59 ml/min 11/08/2015  . Constipation due to pain medication 01/31/2015  .  Obstructive sleep apnea of adult 01/13/2015  . Pelvic muscle wasting 01/13/2015  . Incomplete bladder emptying 01/13/2015  . Headache, migraine 10/24/2014  . 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 Current tobacco use 10/24/2014  . Lumbar radiculopathy, chronic 10/02/2013  . COPD with bronchial hyperresponsiveness ([place]) 10/02/2013  . Major depressive disorder, recurrent, in partial remission ([place]) 10/02/2013  . Hypertension goal BP (blood pressure) < 140/90 10/02/2013  . Multiple sclerosis ([place]) 10/02/2013  . Absence of bladder continence 09/25/2012  . Acontractile bladder  02/12/2012  . Narrowing of intervertebral disc space 11/26/2011  . Kyphoscoliosis and scoliosis 11/26/2011    Past Surgical History:  Procedure Laterality Date  . BACK SURGERY N/A 2002  . CYST EXCISION     lower back  . INGUINAL LYMPH NODE BIOPSY Left 07/04/2016   Procedure: INGUINAL LYMPH NODE BIOPSY;  Surgeon: Christene Lye, MD;  Location: [facility] ORS;  Service: General;  Laterality: Left;  . PORTACATH PLACEMENT N/A 07/22/2016   Procedure: INSERTION PORT-A-CATH;  Surgeon: Christene Lye, MD;  Location: [facility] ORS;  Service: General;  Laterality: N/A;  . TONSILLECTOMY AND ADENOIDECTOMY    . TUBAL LIGATION      Prior to Admission medications   Medication Sig Start Date End Date Taking? Authorizing Provider  acetaminophen (TYLENOL) 325 MG tablet Take 2 tablets (650 mg total) by mouth every 6 (six) hours as needed for mild pain (or Fever >/= 101). 01/08/19   Gouru, Illene Silver, MD  albuterol (VENTOLIN HFA) 108 (90 Base) MCG/ACT inhaler Inhale 1 puff into the lungs every 4 (four) hours as needed for shortness of breath. 07/07/18   [provider]  atorvastatin (LIPITOR) 10 MG tablet Take 10 mg by mouth daily.  01/14/19   [provider]  baclofen (LIORESAL) 10 MG tablet Take 10 mg by mouth 3 (three) times daily. 04/10/19   [provider]  budesonide-formoterol (SYMBICORT) 160-4.5 MCG/ACT inhaler Inhale 2 puffs into the lungs 2 (two) times daily. 07/08/18   [provider]  buPROPion (WELLBUTRIN XL) 300 MG 24 hr tablet Take 300 mg by mouth daily.  01/21/19   [provider]  clonazePAM (KLONOPIN) 1 MG tablet Take 0.5 tablets (0.5 mg total) by mouth at bedtime. 04/22/19   Lavina Hamman, MD  docusate sodium (COLACE) 100 MG capsule Take 1 capsule (100 mg total) by mouth 2 (two) times daily. Patient taking differently: Take 100 mg by mouth 2 (two) times daily as needed.  01/08/19   Gouru, Illene Silver, MD  DULoxetine (CYMBALTA) 60 MG capsule Take 1 capsule (60  mg total) by mouth every morning. 10/14/16   Vaughan Basta, MD  gabapentin (NEURONTIN) 600 MG tablet Take 1 tablet (600 mg total) by mouth 3 (three) times daily. 02/09/17   Bettey Costa, MD  hydrALAZINE (APRESOLINE) 50 MG tablet Take 50 mg by mouth 3 (three) times daily. 11/13/18   [provider]  interferon beta-1a (AVONEX) 30 MCG/0.5ML PSKT injection Inject 30 mcg into the muscle every Monday.    [provider]  Multiple Vitamin (MULTIVITAMIN WITH MINERALS) TABS tablet Take 1 tablet by mouth every evening.     [provider]  MYRBETRIQ 50 MG TB24 tablet TAKE ONE TABLET BY MOUTH ONCE DAILY Patient taking differently: Take 50 mg by mouth daily. TAKE ONE TABLET BY MOUTH ONCE DAILY 03/08/19   Stoioff, Ronda Fairly, MD  polyethylene glycol (MIRALAX / GLYCOLAX) 17 g packet Take 17 g by mouth daily as needed for mild constipation. 04/22/19   Posey Pronto,  Josetta Huddle, MD  potassium chloride (KLOR-CON) 10 MEQ tablet Take 10 mEq by mouth daily.  01/18/19   [provider]  QUEtiapine Fumarate (SEROQUEL XR) 150 MG 24 hr tablet Take 150 mg by mouth at bedtime.     [provider]  Teriflunomide (AUBAGIO) 14 MG TABS Take 14 mg by mouth daily.  12/15/18   [provider]  tiZANidine (ZANAFLEX) 4 MG tablet Take 4 mg by mouth 3 (three) times daily. 10/06/18   [provider]  vitamin C (ASCORBIC ACID) 500 MG tablet Take 500 mg by mouth every evening.     [provider]    Allergies Patient has no known allergies.  Family History  Problem Relation Age of Onset  . COPD Mother   . Diabetes Mother   . Heart failure Mother   . Alcohol abuse Father   . Kidney disease Father   . Kidney failure Father   . Arthritis Sister   . CAD Maternal Grandmother   . Stroke Maternal Grandfather   . Arthritis Sister   . Mental illness Sister   . Arthritis Brother     Social History Social History   Tobacco Use  . Smoking status: Former Smoker     Packs/day: 1.00    Years: 20.00    Pack years: 20.00    Types: Cigarettes    Start date: 04/30/1995    Quit date: 02/03/2016    Years since quitting: 3.3  . Smokeless tobacco: Never Used  Substance Use Topics  . Alcohol use: No    Alcohol/week: 0.0 standard drinks  . Drug use: Yes    Types: Marijuana    Comment: smokes THC occasionally per pt       Review of Systems Constitutional: No fever/chills, positive generalized weakness Eyes: No visual changes. ENT: No sore throat. Cardiovascular: Denies chest pain. Respiratory: Denies shortness of breath. Gastrointestinal: No abdominal pain.  No nausea, no vomiting.  No diarrhea.  No constipation. Genitourinary: Negative for dysuria. Musculoskeletal: Negative for back pain.  Redness on her leg Skin: Negative for rash. Neurological: Negative for headaches, focal weakness or numbness. All other ROS negative ____________________________________________   PHYSICAL EXAM:  VITAL SIGNS: ED Triage Vitals  Enc Vitals Group     BP 06/20/19 1043 102/60     Pulse Rate 06/20/19 1043 81     Resp 06/20/19 1043 17     Temp 06/20/19 1043 97.6 F (36.4 C)     Temp Source 06/20/19 1043 Oral     SpO2 06/20/19 1043 100 %     Weight 06/20/19 1041 (!) 310 lb (140.6 kg)     Height 06/20/19 1041 5\' 8"  (1.727 m)     Head Circumference --      Peak Flow --      Pain Score 06/20/19 1041 0     Pain Loc --      Pain Edu? --      Excl. in [place]? --     Constitutional: Alert and oriented. Well appearing and in no acute distress. Eyes: Conjunctivae are normal. EOMI. Head: Atraumatic. Nose: No congestion/rhinnorhea. Mouth/Throat: Mucous membranes are moist.   Neck: No stridor. Trachea Midline. FROM.  No C-spine tenderness Cardiovascular: Normal rate, regular rhythm. Grossly normal heart sounds.  Good peripheral circulation.  No chest wall tenderness Respiratory: Normal respiratory effort.  No retractions. Lungs CTAB. Gastrointestinal: Soft and  nontender. No distention. No abdominal bruits.  Musculoskeletal: 2+ edema on the left leg mostly in  the foot.  Good distal pulse.  Some redness of the foot.  No joint effusions.  Patient moves all extremities well.  5 out of 5 strength. Neurologic:  Normal speech and language. No gross focal neurologic deficits are appreciated.  Skin:  Skin is warm, dry and intact. No rash noted. Psychiatric: Mood and affect are normal. Speech and behavior are normal. GU: Foley catheter in place Back: No CT L-spine tenderness.  Does have a ulcer stage 2  ____________________________________________   LABS (all labs ordered are listed, but only abnormal results are displayed)  Labs Reviewed  CBC WITH DIFFERENTIAL/PLATELET - Abnormal; Notable for the following components:      Result Value   RBC 3.82 (*)    Hemoglobin 10.4 (*)    HCT 33.0 (*)    RDW 16.0 (*)    Lymphs Abs 0.6 (*)    All other components within normal limits  COMPREHENSIVE METABOLIC PANEL - Abnormal; Notable for the following components:   BUN 31 (*)    Creatinine, Ser 1.35 (*)    Calcium 8.8 (*)    Albumin 3.4 (*)    GFR calc non Af Amer 40 (*)    GFR calc Af Amer 47 (*)    All other components within normal limits  GI PATHOGEN PANEL BY PCR, STOOL  C DIFFICILE QUICK SCREEN W PCR REFLEX  URINALYSIS, ROUTINE W REFLEX MICROSCOPIC  TROPONIN I (HIGH SENSITIVITY)   ____________________________________________   ED ECG REPORT I, Vanessa [place], the attending physician, personally viewed and interpreted this ECG.  EKG is normal sinus rate of 76, no ST elevation, no T wave inversions, normal intervals ____________________________________________  RADIOLOGY   Official radiology report(s): US Venous Img Lower Unilateral Left  Result Date: 06/20/2019 CLINICAL DATA:  Left lower extremity pain and swelling after fall. EXAM: LEFT LOWER EXTREMITY VENOUS DOPPLER ULTRASOUND TECHNIQUE: Gray-scale sonography with compression, as well as  color and duplex ultrasound, were performed to evaluate the deep venous system(s) from the level of the common femoral vein through the popliteal and proximal calf veins. COMPARISON:  08/28/2017 bilateral lower extremity venous Doppler scan. FINDINGS: VENOUS Normal compressibility of the common femoral, superficial femoral, and popliteal veins, as well as the visualized calf veins. Visualized portions of profunda femoral vein and great saphenous vein unremarkable. No filling defects to suggest DVT on grayscale or color Doppler imaging. Doppler waveforms show normal direction of venous flow, normal respiratory phasicity and response to augmentation. Limited views of the contralateral common femoral vein are unremarkable. OTHER Subcutaneous edema noted in the distal left lower extremity. Limitations: none IMPRESSION: No deep venous thrombosis within the left lower extremity. If clinical symptoms are inconsistent or if there are persistent or worsening symptoms, further imaging (possibly involving the iliac veins) may be warranted. Electronically Signed   By: Ilona Sorrel M.D.   On: 06/20/2019 11:45    ____________________________________________   PROCEDURES  Procedure(s) performed (including Critical Care):  Procedures   ____________________________________________   INITIAL IMPRESSION / ASSESSMENT AND PLAN / ED COURSE  [name] was evaluated in Emergency Department on 06/20/2019 for the symptoms described in the history of present illness. She was evaluated in the context of the global COVID-19 pandemic, which necessitated consideration that the patient might be at risk for infection with the SARS-CoV-2 virus that causes COVID-19. Institutional protocols and algorithms that pertain to the evaluation of patients at risk for COVID-19 are in a state of rapid change based on information released by regulatory  bodies including the CDC and federal and state organizations. These policies and  algorithms were followed during the patient's care in the ED.    Patient is a 69 year old who comes in for mechanical fall with generalized weakness for the past few days.  Patient just recently transitioned back home from a rehab facility.  I asked patient what would be her main goal for today she stated that she wants more intensive physical therapy but she knows that that is hard to get.  Patient states that her physical therapist is starting tomorrow.  Patient denies any injuries from the fall to suggest any head trauma, or abdominal pathology.  Although she has had multiple falls will get a CT head just to make sure no evidence of intracranial hemorrhage. no extremity pain.  Patient able to lift both legs up off the ground.  Low suspicion for pelvic fracture.  Patient does have some redness on her lower foot as well as some swelling.  Will get DVT ultrasound.  Suspect that this could be cellulitis although patient is making it sound like she is had that redness before.  Will get labs to evaluate for electrolyte abnormalities, AKI.  Will replace Foley and get a urine to evaluate for UTI.  Patient's labs are reassuring.  Kidney function is at baseline.  No significant anemia.  White count is normal no evidence of sepsis.  DVT ultrasound was negative.  Stool sample sent off.  Discussed with patient that we will treat her for a cellulitis of her left foot.  I discussed with patient whether or not she felt like the foot was the reason she was having the falls.  States it is just been her generalized decline over the past few weeks to months.  We discussed admission for cellulitis and repeat SNF placement if necessary.  Patient did have some lower blood pressures as well which could be contributing to her weakness.  We will give a liter of fluid.  Patient otherwise does not meet SIRS criteria and I do not think patient is septic.  Her UA was also concerning for infection after the catheter was replaced.  Could  represent colonization but will cover both for the cellulitis and possible UTI with a dose of ceftriaxone.  Given this plus the low blood pressures will discuss with the hospital team for admission for observation.  Patient was C. difficile antigen positive but was negative PCR therefore unlikely to have active C. difficile   ____________________________________________   FINAL CLINICAL IMPRESSION(S) / ED DIAGNOSES   Final diagnoses:  Cellulitis of right lower extremity      MEDICATIONS GIVEN DURING THIS VISIT:  Medications  cefTRIAXone (ROCEPHIN) 2 g in sodium chloride 0.9 % 100 mL IVPB (0 g Intravenous Stopped 06/20/19 1326)  sodium chloride 0.9 % bolus 1,000 mL (1,000 mLs Intravenous New Bag/Given 06/20/19 1244)     ED Discharge Orders    None       Note:  This document was prepared using Dragon voice recognition software and may include unintentional dictation errors.   Vanessa Pomeroy, MD 06/20/19 [phone]

## 2019-06-20 NOTE — ED Triage Notes (Signed)
Pt comes from home after being released from [facility] on Monday. Pt has catheter in place. Pt has had multiple falls recently. Today she had another one. No injuries from falls. Pt reports leg weakness. Pt also reports that she wanted to get away from her husband. Pt also has bed sores. Pt uses a walker to get around. Pt AOX4.

## 2019-06-20 NOTE — Assessment & Plan Note (Signed)
Pt has a chronic foley. Urine is clear.

## 2019-06-20 NOTE — Assessment & Plan Note (Signed)
thyroid function test, will need outpatient referral for nutrition and diet for aggressive weight loss as it adds to her mortality.

## 2019-06-20 NOTE — Assessment & Plan Note (Signed)
Aggressive OT / PT for ambulation / fall prevention and weight loss.

## 2019-06-20 NOTE — ED Notes (Signed)
Pt alert and responsive, repositioned to right side.

## 2019-06-20 NOTE — ED Notes (Signed)
EDP notified of pt's bp, fluids ordered and started. Pt sleeping, but easily awakened by voice, answering questions appropriately.

## 2019-06-20 NOTE — Assessment & Plan Note (Signed)
Attribute to MS and deconditioning because of it.  Thyroid function studies. outpatient vit d level. We will check magnesium in house.

## 2019-06-20 NOTE — Assessment & Plan Note (Signed)
We will cont ventolin and dulera.

## 2019-06-20 NOTE — ED Notes (Signed)
Pt to CT

## 2019-06-20 NOTE — Assessment & Plan Note (Signed)
Home regimen of teflunomide ,avonex,continued.

## 2019-06-20 NOTE — Assessment & Plan Note (Signed)
Pt is continued on rocephin as started in ED.Elevation of legs.

## 2019-06-20 NOTE — ED Notes (Signed)
Pt with multiple pressure sores in sacral area, sacral dressing applied. Pt incontinent of stool, peri care done.

## 2019-06-21 LAB — COMPREHENSIVE METABOLIC PANEL
ALT: 15 U/L (ref 0–44)
AST: 16 U/L (ref 15–41)
Albumin: 2.9 g/dL — ABNORMAL LOW (ref 3.5–5.0)
Alkaline Phosphatase: 72 U/L (ref 38–126)
Anion gap: 9 (ref 5–15)
BUN: 23 mg/dL (ref 8–23)
CO2: 24 mmol/L (ref 22–32)
Calcium: 8.9 mg/dL (ref 8.9–10.3)
Chloride: 107 mmol/L (ref 98–111)
Creatinine, Ser: 1.07 mg/dL — ABNORMAL HIGH (ref 0.44–1.00)
GFR calc Af Amer: >60 mL/min (ref >60)
GFR calc non Af Amer: 53 mL/min — ABNORMAL LOW (ref >60)
Glucose, Bld: 100 mg/dL — ABNORMAL HIGH (ref 70–99)
Potassium: 3.7 mmol/L (ref 3.5–5.1)
Sodium: 140 mmol/L (ref 135–145)
Total Bilirubin: 0.6 mg/dL (ref 0.3–1.2)
Total Protein: 5.9 g/dL — ABNORMAL LOW (ref 6.5–8.1)

## 2019-06-21 LAB — GI PATHOGEN PANEL BY PCR, STOOL

## 2019-06-21 LAB — CBC WITH DIFFERENTIAL/PLATELET
Abs Immature Granulocytes: 0.02 10*3/uL (ref 0.00–0.07)
Basophils Absolute: 0.1 10*3/uL (ref 0.0–0.1)
Basophils Relative: 2 %
Eosinophils Absolute: 0.3 10*3/uL (ref 0.0–0.5)
Eosinophils Relative: 6 %
HCT: 31.2 % — ABNORMAL LOW (ref 36.0–46.0)
Hemoglobin: 9.8 g/dL — ABNORMAL LOW (ref 12.0–15.0)
Immature Granulocytes: 0 %
Lymphocytes Relative: 11 %
Lymphs Abs: 0.6 10*3/uL — ABNORMAL LOW (ref 0.7–4.0)
MCH: 27.5 pg (ref 26.0–34.0)
MCHC: 31.4 g/dL (ref 30.0–36.0)
MCV: 87.6 fL (ref 80.0–100.0)
Monocytes Absolute: 0.5 10*3/uL (ref 0.1–1.0)
Monocytes Relative: 10 %
Neutro Abs: 3.9 10*3/uL (ref 1.7–7.7)
Neutrophils Relative %: 71 %
Platelets: 231 10*3/uL (ref 150–400)
RBC: 3.56 MIL/uL — ABNORMAL LOW (ref 3.87–5.11)
RDW: 16.1 % — ABNORMAL HIGH (ref 11.5–15.5)
WBC: 5.4 10*3/uL (ref 4.0–10.5)
nRBC: 0 % (ref 0.0–0.2)

## 2019-06-21 LAB — MAGNESIUM: Magnesium: 1.5 mg/dL — ABNORMAL LOW (ref 1.7–2.4)

## 2019-06-21 MED ORDER — HYDROCERIN EX CREA
TOPICAL_CREAM | Freq: Two times a day (BID) | CUTANEOUS | Status: DC
  Filled 2019-06-21: qty 113

## 2019-06-21 MED ORDER — MAGNESIUM SULFATE 2 GM/50ML IV SOLN
2.0000 g | Freq: Once | INTRAVENOUS | Status: AC
  Administered 2019-06-21: 2 g via INTRAVENOUS
  Filled 2019-06-21: qty 50

## 2019-06-21 MED ORDER — SODIUM CHLORIDE 0.9 % IV SOLN
2.0000 g | INTRAVENOUS | Status: DC
  Administered 2019-06-21: 2 g via INTRAVENOUS
  Filled 2019-06-21: qty 20
  Filled 2019-06-21: qty 2

## 2019-06-21 MED ORDER — CHLORHEXIDINE GLUCONATE CLOTH 2 % EX PADS
6.0000 | MEDICATED_PAD | Freq: Every day | CUTANEOUS | Status: DC
  Administered 2019-06-21 – 2019-06-22 (×2): 6 via TOPICAL

## 2019-06-21 MED ORDER — ALBUTEROL SULFATE (2.5 MG/3ML) 0.083% IN NEBU: 2.5000 mg | INHALATION_SOLUTION | RESPIRATORY_TRACT | Status: DC | PRN

## 2019-06-21 NOTE — TOC Progression Note (Signed)
Transition of Care [facility]) - Progression Note    Patient Details  Name: [name] MRN: [id number] Date of Birth: 12/09/1950  [facility] of Care [facility]) CM/SW Contact  Su Hilt, RN Phone Number: 06/21/2019, 2:29 PM  Clinical Narrative:   Received a call from the patients friend [name], She said that the patient did not understand what we talked about.  She stated that the patient has been in [facility] for a couple of months, I explained that would put the patient in the Copay days, I explained that copay id required and will go from day 21-100, I explained the custodial break also,  She stated that the patient can't afford the copay.  I explained that would mean that she could not go to SNF, she stated that she has applied to Medicaid for the patient, I told here that Medicaid is great to have and would help if she needed long term.  She said that the patient is open with [facility] and most likely would go home after this stay.  I agreed.   Will continue to follow the patient for needs         Expected Discharge Plan and Services                                                 Social Determinants of Health (SDOH) Interventions    Readmission Risk Interventions Readmission Risk Prevention Plan 04/22/2019 02/04/2019 01/07/2019  Transportation Screening Complete - Complete  PCP or Specialist Appt within 3-5 Days - - Complete  HRI or [place] - Complete Complete  Social Work Consult for [place] Planning/Counseling - Complete Complete  Palliative Care Screening - Not Applicable Not Applicable  Medication Review Press photographer) Referral to Pharmacy - Complete  PCP or Specialist appointment within 3-5 days of discharge Complete - -  [place] or Home Care Consult Complete - -  SW Recovery Care/Counseling Consult Complete - -  Palliative Care Screening Not Applicable - -  Skilled Nursing Facility Complete - -  Some recent data might be hidden

## 2019-06-21 NOTE — TOC Progression Note (Signed)
Transition of Care [facility]) - Progression Note    Patient Details  Name: TEMITOPE FLAMMER MRN: [id number] Date of Birth: December 29, 1950  Transition of Care [facility]) CM/SW [place], RN Phone Number: 06/21/2019, 1:42 PM  Clinical Narrative:   Met with the patient to discuss DC needs and plan. She wants to go to rehab, I explained that she has been in rehab already and that she has used her days, she would be in copay days, She stated that she knows I explained that after 100 days she has to have a 60 day custodial break, I explained what that meant, I explained that she would have to be home and not admitted to the hospital and not have [place] or any rehab for 60 days for her days to start over for medicare to cover.  She stated she understood and that she did not want to go to [facility].  She agreed to a bed search        Expected Discharge Plan and Services                                                 Social Determinants of Health (SDOH) Interventions    Readmission Risk Interventions Readmission Risk Prevention Plan 04/22/2019 02/04/2019 01/07/2019  Transportation Screening Complete - Complete  PCP or Specialist Appt within 3-5 Days - - Complete  HRI or [place] - Complete Complete  Social Work Consult for [place] Planning/Counseling - Complete Complete  Palliative Care Screening - Not Applicable Not Applicable  Medication Review Press photographer) Referral to Pharmacy - Complete  PCP or Specialist appointment within 3-5 days of discharge Complete - -  [place] or Home Care Consult Complete - -  SW Recovery Care/Counseling Consult Complete - -  Palliative Care Screening Not Applicable - -  Skilled Nursing Facility Complete - -  Some recent data might be hidden

## 2019-06-21 NOTE — Progress Notes (Addendum)
PROGRESS NOTE    AVO VOCI  [id number] DOB: [birth year] DOA: 06/20/2019 PCP: Kirk Ruths, MD    Brief Narrative:  [name] is a 69 y.o. female with medical history significant of hypertension, B-cell lymphoma, multiple sclerosis, urinary retention with chronic indwelling Foley who comes in for generalized weakness    Consultants:   none  Procedures:  CT IMPRESSION: 1. No acute intracranial abnormalities. 2. Moderate cerebral atrophy (most severe in the frontal regions) with extensive chronic microvascular ischemic changes and old left cerebellar infarct, as above.  Antimicrobials:   ceftrixone   Subjective: Pt has no new complaints today.  Physical therapy at bedside.  She denies fever, chills, shortness of breath.  She does report not having much support at home from her husband.  Objective: Vitals:   06/20/19 1815 06/20/19 2200 06/21/19 0008 06/21/19 0754  BP: (!) 122/59 92/62 106/61 109/65  Pulse: 70 71 75 76  Resp: 18 16 18 17   Temp: (!) 97.5 F (36.4 C) 98.3 F (36.8 C) 97.9 F (36.6 C) 98 F (36.7 C)  TempSrc: Oral Oral Oral Oral  SpO2: 94% 96% 98% 99%  Weight:      Height:        Intake/Output Summary (Last 24 hours) at 06/21/2019 1232 Last data filed at 06/21/2019 0900 Gross per 24 hour  Intake 1360 ml  Output 850 ml  Net 510 ml   Filed Weights   06/20/19 1041  Weight: (!) 140.6 kg    Examination:  General exam: Appears calm and comfortable NAD Respiratory system: Clear to auscultation. Respiratory effort normal. Cardiovascular system: S1 & S2 heard, RRR. No  murmurs, rubs, gallops or clicks.  Gastrointestinal system: Abdomen is nondistended, soft and nontender.  Normal bowel sounds heard. Central nervous system: Alert and oriented. No focal neurological deficits. Extremities: Left lower extremity with mild erythema, chronic skin changes , +edema L>R.  Skin: warm, dry Psychiatry: Judgement and insight appear normal. Mood  & affect appropriate.     Data Reviewed: I have personally reviewed following labs and imaging studies  CBC: Recent Labs  Lab 06/20/19 1055 06/20/19 1827 06/21/19 0357  WBC 7.7 6.1 5.4  NEUTROABS 6.0  --  3.9  HGB 10.4* 10.7* 9.8*  HCT 33.0* 33.7* 31.2*  MCV 86.4 86.4 87.6  PLT 241 238 [id number]   Basic Metabolic Panel: Recent Labs  Lab 06/20/19 1055 06/20/19 1827 06/21/19 0357  NA 135  --  140  K 3.8  --  3.7  CL 102  --  107  CO2 25  --  24  GLUCOSE 88  --  100*  BUN 31*  --  23  CREATININE 1.35* 1.20* 1.07*  CALCIUM 8.8*  --  8.9  MG  --   --  1.5*   GFR: Estimated Creatinine Clearance: 75.1 mL/min (A) (by C-G formula based on SCr of 1.07 mg/dL (H)). Liver Function Tests: Recent Labs  Lab 06/20/19 1055 06/21/19 0357  AST 17 16  ALT 15 15  ALKPHOS 78 72  BILITOT 0.6 0.6  PROT 6.8 5.9*  ALBUMIN 3.4* 2.9*   No results for input(s): LIPASE, AMYLASE in the last 168 hours. No results for input(s): AMMONIA in the last 168 hours. Coagulation Profile: No results for input(s): INR, PROTIME in the last 168 hours. Cardiac Enzymes: No results for input(s): CKTOTAL, CKMB, CKMBINDEX, TROPONINI in the last 168 hours. BNP (last 3 results) No results for input(s): PROBNP in the last 8760 hours. HbA1C: Recent  Labs    06/20/19 1827  HGBA1C 5.0   CBG: No results for input(s): GLUCAP in the last 168 hours. Lipid Profile: No results for input(s): CHOL, HDL, LDLCALC, TRIG, CHOLHDL, LDLDIRECT in the last 72 hours. Thyroid Function Tests: Recent Labs    06/20/19 1826  TSH 1.127  FREET4 0.88   Anemia Panel: No results for input(s): VITAMINB12, FOLATE, FERRITIN, TIBC, IRON, RETICCTPCT in the last 72 hours. Sepsis Labs: No results for input(s): PROCALCITON, LATICACIDVEN in the last 168 hours.  Recent Results (from the past 240 hour(s))  SARS CORONAVIRUS 2 (TAT 6-24 HRS) Nasopharyngeal Nasopharyngeal Swab     Status: None   Collection Time: 06/20/19 10:36 AM    Specimen: Nasopharyngeal Swab  Result Value Ref Range Status   SARS Coronavirus 2 NEGATIVE NEGATIVE Final    Comment: (NOTE) SARS-CoV-2 target nucleic acids are NOT DETECTED. The SARS-CoV-2 RNA is generally detectable in upper and lower respiratory specimens during the acute phase of infection. Negative results do not preclude SARS-CoV-2 infection, do not rule out co-infections with other pathogens, and should not be used as the sole basis for treatment or other patient management decisions. Negative results must be combined with clinical observations, patient history, and epidemiological information. The expected result is Negative. Fact Sheet for Patients: SugarRoll.be Fact Sheet for Healthcare Providers: https://www.woods-mathews.com/ This test is not yet approved or cleared by the [place] FDA and  has been authorized for detection and/or diagnosis of SARS-CoV-2 by FDA under an Emergency Use Authorization (EUA). This EUA will remain  in effect (meaning this test can be used) for the duration of the COVID-19 declaration under Section 56 4(b)(1) of the Act, 21 U.S.C. section 360bbb-3(b)(1), unless the authorization is terminated or revoked sooner. Performed at [facility], [place] [address], [place], [place] [address]   C Difficile Quick Screen w PCR reflex     Status: Abnormal   Collection Time: 06/20/19 10:55 AM   Specimen: Stool  Result Value Ref Range Status   C Diff antigen POSITIVE (A) NEGATIVE Final   C Diff toxin NEGATIVE NEGATIVE Final   C Diff interpretation Results are indeterminate. See PCR results.  Final    Comment: Performed at [facility], [address] [place], [place] [address]  C. Diff by PCR, Reflexed     Status: None   Collection Time: 06/20/19 10:55 AM  Result Value Ref Range Status   Toxigenic C. Difficile by PCR NEGATIVE NEGATIVE Final    Comment: Patient is colonized with non toxigenic C.  difficile. May not need treatment unless significant symptoms are present. Performed at [facility], [address], [place], [place] [address]          Radiology Studies: CT Head Wo Contrast  Result Date: 06/20/2019 CLINICAL DATA:  69 year old female with history of head trauma. Headache. EXAM: CT HEAD WITHOUT CONTRAST TECHNIQUE: Contiguous axial images were obtained from the base of the skull through the vertex without intravenous contrast. COMPARISON:  Head CT 06/11/2018. FINDINGS: Brain: Moderate cerebral atrophy, most severe in the frontal lobes bilaterally. Patchy and confluent areas of decreased attenuation are noted throughout the deep and periventricular white matter of the cerebral hemispheres bilaterally, compatible with chronic microvascular ischemic disease. Linear well-defined area of low attenuation in the left cerebellar hemisphere, stable compared to the prior study, compatible with an old infarct. No evidence of acute infarction, hemorrhage, hydrocephalus, extra-axial collection or mass lesion/mass effect. Vascular: No hyperdense vessel or unexpected calcification. Skull: Normal.  Negative for fracture or focal lesion. Sinuses/Orbits: No acute finding. Other: No priors. IMPRESSION: 1. No acute intracranial abnormalities. 2. Moderate cerebral atrophy (most severe in the frontal regions) with extensive chronic microvascular ischemic changes and old left cerebellar infarct, as above. Electronically Signed   By: Vinnie Langton M.D.   On: 06/20/2019 13:31   US Venous Img Lower Unilateral Left  Result Date: 06/20/2019 CLINICAL DATA:  Left lower extremity pain and swelling after fall. EXAM: LEFT LOWER EXTREMITY VENOUS DOPPLER ULTRASOUND TECHNIQUE: Gray-scale sonography with compression, as well as color and duplex ultrasound, were performed to evaluate the deep venous system(s) from the level of the common femoral vein through the popliteal and proximal calf veins. COMPARISON:   08/28/2017 bilateral lower extremity venous Doppler scan. FINDINGS: VENOUS Normal compressibility of the common femoral, superficial femoral, and popliteal veins, as well as the visualized calf veins. Visualized portions of profunda femoral vein and great saphenous vein unremarkable. No filling defects to suggest DVT on grayscale or color Doppler imaging. Doppler waveforms show normal direction of venous flow, normal respiratory phasicity and response to augmentation. Limited views of the contralateral common femoral vein are unremarkable. OTHER Subcutaneous edema noted in the distal left lower extremity. Limitations: none IMPRESSION: No deep venous thrombosis within the left lower extremity. If clinical symptoms are inconsistent or if there are persistent or worsening symptoms, further imaging (possibly involving the iliac veins) may be warranted. Electronically Signed   By: Ilona Sorrel M.D.   On: 06/20/2019 11:45        Scheduled Meds: . vitamin C  500 mg Oral QPM  . atorvastatin  10 mg Oral Daily  . baclofen  10 mg Oral TID  . buPROPion  300 mg Oral Daily  . Chlorhexidine Gluconate Cloth  6 each Topical Daily  . DULoxetine  60 mg Oral BH-q7a  . gabapentin  600 mg Oral TID  . heparin  5,000 Units Subcutaneous Q8H  . hydrocerin   Topical BID  . interferon beta-1a  30 mcg Intramuscular Q Mon  . mirabegron ER  50 mg Oral Daily  . mometasone-formoterol  2 puff Inhalation BID  . multivitamin with minerals  1 tablet Oral QPM  . QUEtiapine Fumarate  150 mg Oral QHS  . Teriflunomide  14 mg Oral Daily  . tiZANidine  4 mg Oral TID   Continuous Infusions: . cefTRIAXone (ROCEPHIN)  IV 2 g (06/21/19 1215)    Assessment & Plan:   Principal Problem:   Recurrent cellulitis of lower extremity Active Problems:   COPD with bronchial hyperresponsiveness (HCC)   Bladder neurogenesis   Multiple sclerosis (HCC)   Current tobacco use   Obstructive sleep apnea of adult   CKD (chronic kidney disease)  stage 3, GFR 30-59 ml/min   Obesity, morbid (HCC)   Multiple falls   Generalized weakness   1.  Generalized weakness-likely due to MS and deconditioning Patient reports at home she is not very motivated for doing any type of ambulation and also has issues with her husband apparently. PT evaluated recommend SNF Also found with low bp and AKI, may attribut some to her sx.  Will order OT   #2 hypertension-currently patient is blood pressure on the low side. Will discontinue amlodipine and hydralazine.  Monitor and may need reinstitution slowly as blood pressure is tolerated  #3-Acute on chronic kidney disease -likely prerenal.  Was also on lisinopril.  Now has resolved with IV fluids. Continue to monitor  #4 left lower extremity cellulitis-we  will keep legs elevated.  Continue Rocephin  #5 multiple sclerosis-Home regimen of teflunomide ,avonex,continued. PT/OT consult  #6 multiple falls-PT OT Fall prevention   #7 bladder neuro [facility]-on chronic Foley  8 OSA-untreated Will need outpatient referral for sleep study for different options of oral appliance  DVT prophylaxis: Heparin Code Status: Full Family Communication: None at bedside Disposition Plan: SNF placement       LOS: 1 day   Time spent: 45 minutes with more than 50% on [place], MD [facility] Pager [phone]  If 7PM-7AM, please contact night-coverage [email or web address] Password [facility] 06/21/2019, 12:32 PM

## 2019-06-21 NOTE — NC FL2 (Signed)
[place] LEVEL OF CARE SCREENING TOOL     IDENTIFICATION  Patient Name: [name] Birthdate: [birth year] Sex: female Admission Date (Current Location): 06/20/2019  [place] and [place] Number:  Engineering geologist and Address:  [facility], [address] [place], [place] [address]      Provider Number: [id number]  Attending Physician Name and Address:  Nolberto Hanlon, MD  Relative Name and Phone Number:  Jerrye Noble [phone]    Current Level of Care: Hospital Recommended Level of Care: [place] Prior Approval Number:    Date Approved/Denied:   PASRR Number: [id number]  Discharge Plan: SNF    Current Diagnoses: Patient Active Problem List   Diagnosis Date Noted  . Generalized weakness   . AKI (acute kidney injury) ([place]) 04/20/2019  . Pressure injury of skin 02/03/2019  . Bilateral lower leg cellulitis 02/02/2019  . Ovarian mass, left 01/27/2019  . Sepsis ([place]) 01/05/2019  . UTI (urinary tract infection) 06/13/2018  . Altered mental status 06/11/2018  . Fall 05/13/2018  . Depression 05/13/2018  . Recurrent cellulitis of lower extremity 06/25/2017  . Medication monitoring encounter 06/05/2017  . Cellulitis of left lower leg 04/25/2017  . Adjustment disorder with mixed disturbance of emotions and conduct 04/23/2017  . Foot pain, bilateral 02/03/2017  . Tinea pedis 02/03/2017  . Left ovarian cyst 12/09/2016  . Abdominal aortic atherosclerosis ([place]) 11/11/2016  . Swelling of lower extremity 10/11/2016  . Obstructive sleep apnea 10/11/2016  . Follicular lymphoma of intra-abdominal lymph nodes ([place]) 08/06/2016  . Multiple falls 06/07/2016  . Inguinal adenopathy 05/29/2016  . Obesity, morbid ([place]) 01/05/2016  . Low HDL (under 40) 12/19/2015  . Cellulitis 12/18/2015  . Skin ulcer ([place]) 11/08/2015  . Peripheral vascular disease of lower extremity with ulceration ([place]) 11/08/2015  . CKD (chronic kidney  disease) stage 3, GFR 30-59 ml/min 11/08/2015  . Constipation due to pain medication 01/31/2015  . Obstructive sleep apnea of adult 01/13/2015  . Pelvic muscle wasting 01/13/2015  . Incomplete bladder emptying 01/13/2015  . Headache, migraine 10/24/2014  . 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 Current tobacco use 10/24/2014  . Lumbar radiculopathy, chronic 10/02/2013  . COPD with bronchial hyperresponsiveness ([place]) 10/02/2013  . Major depressive disorder, recurrent, in partial remission ([place]) 10/02/2013  . Hypertension goal BP (blood pressure) < 140/90 10/02/2013  . Multiple sclerosis ([place]) 10/02/2013  . Absence of bladder continence 09/25/2012  . Acontractile bladder 02/12/2012  . Narrowing of intervertebral disc space 11/26/2011  . Kyphoscoliosis and scoliosis 11/26/2011    Orientation RESPIRATION BLADDER Height & Weight     Self, Time, Situation, Place    Continent Weight: (!) 140.6 kg Height:  5\' 8"  (172.7 cm)  BEHAVIORAL SYMPTOMS/MOOD NEUROLOGICAL BOWEL NUTRITION STATUS      Continent    AMBULATORY STATUS COMMUNICATION OF NEEDS Skin   Extensive Assist Verbally Other (Comment)(red scaly legs)                       Personal Care Assistance Level of Assistance              Functional Limitations Info             SPECIAL CARE FACTORS FREQUENCY  PT (By licensed PT)     PT Frequency: daily              Contractures Contractures Info: Not present    Additional Factors Info  Allergies, Code Status Code Status  Info: Full code Allergies Info: NKDA           Current Medications (06/21/2019):  This is the current hospital active medication list Current Facility-Administered Medications  Medication Dose Route Frequency Provider Last Rate Last Admin  . acetaminophen (TYLENOL) tablet 650 mg  650 mg Oral Q6H PRN Para Skeans, MD   650 mg at 06/21/19 0936  . albuterol (PROVENTIL) (2.5 MG/3ML) 0.083% nebulizer solution 2.5 mg  2.5 mg Nebulization Q4H PRN  Hallaji, Dani Gobble, RPH      . ascorbic acid (VITAMIN C) tablet 500 mg  500 mg Oral QPM Para Skeans, MD   500 mg at 06/20/19 2214  . atorvastatin (LIPITOR) tablet 10 mg  10 mg Oral Daily Para Skeans, MD   10 mg at 06/21/19 0931  . baclofen (LIORESAL) tablet 10 mg  10 mg Oral TID Para Skeans, MD   10 mg at 06/21/19 0933  . buPROPion (WELLBUTRIN XL) 24 hr tablet 300 mg  300 mg Oral Daily Para Skeans, MD   300 mg at 06/21/19 0932  . cefTRIAXone (ROCEPHIN) 2 g in sodium chloride 0.9 % 100 mL IVPB  2 g Intravenous Q24H Nolberto Hanlon, MD 200 mL/hr at 06/21/19 1215 2 g at 06/21/19 1215  . Chlorhexidine Gluconate Cloth 2 % PADS 6 each  6 each Topical Daily Para Skeans, MD   6 each at 06/21/19 1012  . clonazePAM (KLONOPIN) tablet 1 mg  1 mg Oral BID PRN Para Skeans, MD   1 mg at 06/21/19 0945  . DULoxetine (CYMBALTA) DR capsule 60 mg  60 mg Oral Valrie Hart, MD   60 mg at 06/21/19 [id number]  . gabapentin (NEURONTIN) tablet 600 mg  600 mg Oral TID Para Skeans, MD   600 mg at 06/21/19 0932  . heparin injection 5,000 Units  5,000 Units Subcutaneous Q8H Para Skeans, MD   5,000 Units at 06/21/19 [phone]  . hydrocerin (EUCERIN) cream   Topical BID Nolberto Hanlon, MD      . interferon beta-1a (AVONEX) injection 30 mcg  30 mcg Intramuscular Q Crista Curb, Esmeralda Links V, MD      . mirabegron ER [facility]) tablet 50 mg  50 mg Oral Daily Para Skeans, MD   50 mg at 06/21/19 0933  . mometasone-formoterol (DULERA) 200-5 MCG/ACT inhaler 2 puff  2 puff Inhalation BID Para Skeans, MD   2 puff at 06/21/19 0804  . multivitamin with minerals tablet 1 tablet  1 tablet Oral QPM Florina Ou V, MD      . QUEtiapine (SEROQUEL XR) 24 hr tablet 150 mg  150 mg Oral QHS Para Skeans, MD   150 mg at 06/20/19 2214  . Teriflunomide TABS 14 mg  14 mg Oral Daily Florina Ou V, MD      . tiZANidine (ZANAFLEX) tablet 4 mg  4 mg Oral TID Para Skeans, MD   4 mg at 06/21/19 [id number]   Facility-Administered Medications Ordered in Other  Encounters  Medication Dose Route Frequency Provider Last Rate Last Admin  . heparin lock flush 100 unit/mL  500 Units Intracatheter Once PRN Sindy Guadeloupe, MD         Discharge Medications: Please see discharge summary for a list of discharge medications.  Relevant Imaging Results:  Relevant Lab Results:   Additional Information SS# [id number]  Su Hilt, RN

## 2019-06-21 NOTE — Evaluation (Signed)
Occupational Therapy Evaluation Patient Details Name: [name] MRN: [id number] DOB: 1950/10/14 Today's Date: 06/21/2019    History of Present Illness Pt is a 69 y.o. female that presented to ED for weakness and falls at home. PMH of with medical history significant of hypertension, B-cell lymphoma, multiple sclerosis, urinary retention with chronic indwelling Foley. Patient stated that she was admitted to the hospital back in December and was at a rehab facility [facility] per chart and pt) but was discharged home about a week ago.   Clinical Impression   Patient seen this date for OT evaluation.  Patient supine in bed upon entry but agreeable to therapy.  No reports of pain.  Patient initiating log rolling to sit at EOB when dinner tray arrived.  Patient requested to eat in bed.  Patient required MOD A for log rolling this date.  Educated patient on positioning self upwards in bed, required assist of 2 to scoot up in bed to prepare for meal time.  Educated patient on importance of 'floating' heels to reduce risk for skin breakdown.  Pt verbalized understanding and pillows placed accordingly.  Educated patient on simple ROM ther-ex to complete supine in bed including ankle pumps.  Educated patient on OT goals and she verbalized understanding.  Patient would benefit from continued skilled OT to address strengthening, activity tolerance, balance, safety awareness, ADL retraining and functional transfers.  Based on today's performance, recommending SNF at time of d/c.    Follow Up Recommendations  SNF    Equipment Recommendations  Other (comment)(defer to next level of care)    Recommendations for Other Services       Precautions / Restrictions Precautions Precautions: Fall Restrictions Weight Bearing Restrictions: No      Mobility Bed Mobility Overal bed mobility: Needs Assistance Bed Mobility: Rolling Rolling: Mod assist         General bed mobility comments: Requires use of grab  bars.  Difficulty moving B LEs.  Scoot up in bed with assist of 2.  Transfers                 General transfer comment: Unable to assess transfers at this time.    Balance                                           ADL either performed or assessed with clinical judgement   ADL Overall ADL's : Needs assistance/impaired     Grooming: Wash/dry hands;Wash/dry face;Bed level;Set up           Upper Body Dressing : Moderate assistance;Bed level;Cueing for safety;Cueing for sequencing   Lower Body Dressing: Maximal assistance;+2 for safety/equipment;Bed level                 General ADL Comments: Unable to assess functional transfers at this time.  Per nursing and PT notes, requires assist of 2.     Vision   Vision Assessment?: No apparent visual deficits     Perception     Praxis      Pertinent Vitals/Pain Pain Assessment: No/denies pain(No pain while supine in bed)     Hand Dominance Right   Extremity/Trunk Assessment Upper Extremity Assessment Upper Extremity Assessment: Generalized weakness   Lower Extremity Assessment Lower Extremity Assessment: Defer to PT evaluation       Communication Communication Communication: No difficulties   Cognition Arousal/Alertness: Awake/alert Behavior  During Therapy: WFL for tasks assessed/performed Overall Cognitive Status: Within Functional Limits for tasks assessed                                     General Comments       Exercises Other Exercises Other Exercises: Educated patient in ankle pumps. Other Exercises: Educated patient in floating B heels to reduce skin breakdown. Other Exercises: Educated patient in body mechanics while log rolling for improved independence. Other Exercises: Educated patient in assisting to scoot herself up in bed for better positioning when completing activities at bed level.   Shoulder Instructions      Home Living Family/patient expects  to be discharged to:: Private residence Living Arrangements: Spouse/significant other Available Help at Discharge: Family;Available 24 hours/day Type of Home: House Home Access: Stairs to enter;Ramped entrance Entrance Stairs-Number of Steps: has a ramp, and then up two more steps (has a grab handle on the L)   Home Layout: One level     Bathroom Shower/Tub: Occupational psychologist: Standard Bathroom Accessibility: Yes   Home Equipment: Wheelchair - [employer] - 4 wheels;Bedside commode;Grab bars - tub/shower;Other (comment);Shower seat   Additional Comments: pt reports 2 steps into house after ramp to get on porch      Prior Functioning/Environment Level of Independence: Needs assistance  Gait / Transfers Assistance Needed: Patient uses rollator within home.  Patient often sleeps in lift chair to help with transfers. ADL's / Homemaking Assistance Needed: Husbands assists with some ADLs, ie bathing, dressing.   Comments: Patient reports a significant increase in amount of falls within the last week.        OT Problem List: Decreased strength;Decreased activity tolerance;Decreased safety awareness      OT Treatment/Interventions: Self-care/ADL training;Therapeutic exercise;Energy conservation;Therapeutic activities;DME and/or AE instruction;Patient/family education    OT Goals(Current goals can be found in the care plan section) Acute Rehab OT Goals Patient Stated Goal: I want to feel better about doing things. OT Goal Formulation: With patient Time For Goal Achievement: 07/05/19 Potential to Achieve Goals: Good  OT Frequency: Min 2X/week   Barriers to D/C:            Co-evaluation              AM-PAC OT "6 Clicks" Daily Activity     Outcome Measure Help from another person eating meals?: None Help from another person taking care of personal grooming?: None Help from another person toileting, which includes using toliet, bedpan, or urinal?: A  Lot Help from another person bathing (including washing, rinsing, drying)?: A Lot Help from another person to put on and taking off regular upper body clothing?: A Lot Help from another person to put on and taking off regular lower body clothing?: A Lot 6 Click Score: 16   End of Session    Activity Tolerance: Other (comment)(Patient declined further treatment secondary to dinner tray arrival.) Patient left: in bed;with call bell/phone within reach;with bed alarm set  OT Visit Diagnosis: Repeated falls (R29.6);Muscle weakness (generalized) (M62.81)                Time: [id number] OT Time Calculation (min): 15 min Charges:  OT General Charges $OT Visit: 1 Visit OT Evaluation $OT Eval Moderate Complexity: 1 Mod OT Treatments $Therapeutic Activity: 8-22 mins  Baldomero Lamy, MS, OTR/L 06/21/19, 5:06 PM

## 2019-06-21 NOTE — Plan of Care (Signed)

## 2019-06-21 NOTE — Evaluation (Signed)
Physical Therapy Evaluation Patient Details Name: [name] MRN: [id number] DOB: Dec 29, 1950 Today's Date: 06/21/2019   History of Present Illness  Pt is a 69 y.o. female that presented to ED for weakness and falls at home. PMH of with medical history significant of hypertension, B-cell lymphoma, multiple sclerosis, urinary retention with chronic indwelling Foley. Patient stated that she was admitted to the hospital back in December and was at a rehab facility [facility] per chart and pt) but was discharged home about a week ago.    Clinical Impression  Pt alert, oriented, reported LLE pain as a 6/10. Pt reported that she went home from rehab about a week ago, since this she has had 3-4 falls (one that precipitated ED visit for current admission), has not been able to ambulate safely in home with her rollator due to weakness (ongoing for several months, led to previous SNF stay), that her home is not wheelchair accessible. She stated that her husband is unable to provide adequate support (stated he is too weak).    The patient was able to lift all extremities against gravity, generalized weakness and quickly fatigue with LE testing. The patient needed modA and heavy use of bed rails to transfer to sit EOB. Exhibited fair sitting balance once positioned, sit <> stand attempted several times during session. Unable to clear buttocks from bed with 1 person assist and RW, modAx2 with RW to come to standing. MinAx2 with RW to take a few shuffled steps/stand pivot to recliner in room with assistance of RW as well. Pt fatigued and needed maxA to lower to sitting safely.  Overall the patient demonstrated deficits (see "PT Problem List") that impede the patient's functional abilities, safety, and mobility and would benefit from skilled PT intervention. Recommendation is SNF due to decreased caregiver support, inaccessible home environment, current level of physical assistance needed for decreased functional  mobility.     Follow Up Recommendations SNF    Equipment Recommendations  Other (comment)(TBD at next venue of care)    Recommendations for Other Services       Precautions / Restrictions Precautions Precautions: Fall Restrictions Weight Bearing Restrictions: No      Mobility  Bed Mobility Overal bed mobility: Needs Assistance Bed Mobility: Supine to Sit     Supine to sit: Mod assist;HOB elevated     General bed mobility comments: heavy use of grab bars  Transfers Overall transfer level: Needs assistance Equipment used: Rolling walker (2 wheeled) Transfers: Sit to/from [facility] Sit to Stand: Max assist;Mod assist;+2 physical assistance;From elevated surface Stand pivot transfers: Min assist;+2 physical assistance;+2 safety/equipment       General transfer comment: attempted with 1 person assist, unable to complete, modAx2 to come completely to standing.  Ambulation/Gait             General Gait Details: deferred due to pt fatigue/weakness  Stairs            Wheelchair Mobility    Modified Rankin (Stroke Patients Only)       Balance Overall balance assessment: Needs assistance Sitting-balance support: Feet supported Sitting balance-Leahy Scale: Fair       Standing balance-Leahy Scale: Poor Standing balance comment: reliant on RW and minA to maintain balance especially when attempting to transfer to chair                             Pertinent Vitals/Pain Pain Assessment: 0-10 Pain Score: 6  Pain Location: L ankle/leg Pain Descriptors / Indicators: Aching;Burning;Grimacing Pain Intervention(s): Limited activity within patient's tolerance;Monitored during session;Repositioned;Patient requesting pain meds-RN notified    Home Living Family/patient expects to be discharged to:: Private residence Living Arrangements: Spouse/significant other Available Help at Discharge: Family;Available 24 hours/day Type of  Home: House Home Access: Stairs to enter;Ramped entrance   Entrance Stairs-Number of Steps: has a ramp, and then up two more steps (has a grab handle on the L) Home Layout: One level Home Equipment: Wheelchair - [employer] - 4 wheels;Bedside commode;Grab bars - tub/shower;Other (comment);Shower seat(lift chair, reacher, shoe horn)      Prior Function Level of Independence: Needs assistance   Gait / Transfers Assistance Needed: Pt reports using 4WW for short household distances, sleeps in lift chair which helps with transfers. Pt reported recently she is only able to take a few steps  ADL's / Homemaking Assistance Needed: States she needs assist from husband for bathing and dressing  Comments: in the last 6 months pt reported 15-20 falls. In the last week since shes been home she has fallen 3-4 times.     Hand Dominance   Dominant Hand: Right    Extremity/Trunk Assessment   Upper Extremity Assessment Upper Extremity Assessment: Generalized weakness    Lower Extremity Assessment Lower Extremity Assessment: RLE deficits/detail;LLE deficits/detail RLE Deficits / Details: grossly 4-/5 LLE Deficits / Details: grossly 3+/5, limited by pain       Communication   Communication: No difficulties  Cognition Arousal/Alertness: Awake/alert Behavior During Therapy: WFL for tasks assessed/performed Overall Cognitive Status: Within Functional Limits for tasks assessed                                        General Comments      Exercises     Assessment/Plan    PT Assessment Patient needs continued PT services  PT Problem List Decreased strength;Decreased mobility;Decreased activity tolerance;Decreased balance;Pain;Decreased range of motion       PT Treatment Interventions DME instruction;Therapeutic exercise;Gait training;Balance training;Stair training;Neuromuscular re-education;Functional mobility training;Therapeutic activities;Patient/family education     PT Goals (Current goals can be found in the Care Plan section)  Acute Rehab PT Goals Patient Stated Goal: Pt wants to get her strength back PT Goal Formulation: With patient Time For Goal Achievement: 07/05/19 Potential to Achieve Goals: Fair    Frequency Min 2X/week   Barriers to discharge Inaccessible home environment;Decreased caregiver support      Co-evaluation               AM-PAC PT "6 Clicks" Mobility  Outcome Measure Help needed turning from your back to your side while in a flat bed without using bedrails?: A Lot Help needed moving from lying on your back to sitting on the side of a flat bed without using bedrails?: A Lot Help needed moving to and from a bed to a chair (including a wheelchair)?: A Lot Help needed standing up from a chair using your arms (e.g., wheelchair or bedside chair)?: A Lot Help needed to walk in hospital room?: A Lot Help needed climbing 3-5 steps with a railing? : Total 6 Click Score: 11    End of Session Equipment Utilized During Treatment: Gait belt Activity Tolerance: Patient limited by fatigue;Patient limited by pain Patient left: in chair;with chair alarm set;with nursing/sitter in room;with call bell/phone within reach Nurse Communication: Mobility status;Patient requests pain meds PT Visit Diagnosis:  Other abnormalities of gait and mobility (R26.89);Difficulty in walking, not elsewhere classified (R26.2);Muscle weakness (generalized) (M62.81);Repeated falls (R29.6);History of falling (Z91.81);Pain Pain - Right/Left: Left Pain - part of body: Leg    Time: [id number] PT Time Calculation (min) (ACUTE ONLY): 31 min   Charges:   PT Evaluation $PT Eval Moderate Complexity: 1 Mod PT Treatments $Therapeutic Activity: 8-22 mins       Lieutenant Diego PT, DPT 11:22 AM,06/21/19

## 2019-06-22 LAB — URINE CULTURE: Culture: >=100000 — AB

## 2019-06-22 LAB — HIV ANTIBODY (ROUTINE TESTING W REFLEX): HIV Screen 4th Generation wRfx: NONREACTIVE — AB

## 2019-06-22 MED ORDER — CEPHALEXIN 500 MG PO CAPS
500.0000 mg | ORAL_CAPSULE | Freq: Three times a day (TID) | ORAL | Status: DC
  Administered 2019-06-22: 500 mg via ORAL
  Filled 2019-06-22: qty 1

## 2019-06-22 MED ORDER — MAGNESIUM OXIDE 400 (241.3 MG) MG PO TABS: 400.0000 mg | ORAL_TABLET | Freq: Every day | ORAL | 0 refills | Status: DC

## 2019-06-22 MED ORDER — CEPHALEXIN 500 MG PO CAPS: 500.0000 mg | ORAL_CAPSULE | Freq: Three times a day (TID) | ORAL | 0 refills | Status: AC

## 2019-06-22 MED ORDER — MAGNESIUM OXIDE 400 (241.3 MG) MG PO TABS
400.0000 mg | ORAL_TABLET | Freq: Every day | ORAL | Status: DC
  Administered 2019-06-22: 400 mg via ORAL
  Filled 2019-06-22: qty 1

## 2019-06-22 MED ORDER — SODIUM CHLORIDE 0.9 % IV SOLN
2.0000 g | Freq: Once | INTRAVENOUS | Status: AC
  Administered 2019-06-22: 2 g via INTRAVENOUS
  Filled 2019-06-22: qty 2

## 2019-06-22 MED ORDER — HYDROCERIN EX CREA: 1.0000 "application " | TOPICAL_CREAM | Freq: Two times a day (BID) | CUTANEOUS | 0 refills | Status: DC

## 2019-06-22 NOTE — TOC Progression Note (Signed)
Transition of Care [facility]) - Progression Note    Patient Details  Name: [name] MRN: [id number] Date of Birth: 1950/10/29  Transition of Care [facility]) CM/SW [place], RN Phone Number: 06/22/2019, 10:04 AM  Clinical Narrative:   [name] called and stated that she spoke with the patient and the patient is confused and not at baseline, she stated that the patient can not afford the copay for the SNF She stated that the patient has a wheelchair at home and a left chair she stated that the patient is open with [facility] at home for [facility], She plans to return home with her husband. [name] stated that she has applied for Medicaid for the patient however she does not qualify financially but she is hoping to get it anyway. She requested that I ask the physician for a Neuro consult due to the patient not being alert and oriented and at baseline she is.  I notified the physician of the request, I explained I do not know what day the patient may DC yet.         Expected Discharge Plan and Services                                                 Social Determinants of Health (SDOH) Interventions    Readmission Risk Interventions Readmission Risk Prevention Plan 04/22/2019 02/04/2019 01/07/2019  Transportation Screening Complete - Complete  PCP or Specialist Appt within 3-5 Days - - Complete  HRI or [place] - Complete Complete  Social Work Consult for [place] Planning/Counseling - Complete Complete  Palliative Care Screening - Not Applicable Not Applicable  Medication Review Press photographer) Referral to Pharmacy - Complete  PCP or Specialist appointment within 3-5 days of discharge Complete - -  [place] or Home Care Consult Complete - -  SW Recovery Care/Counseling Consult Complete - -  Palliative Care Screening Not Applicable - -  Skilled Nursing Facility Complete - -  Some recent data might be hidden

## 2019-06-22 NOTE — TOC Progression Note (Signed)
Transition of Care [facility]) - Progression Note    Patient Details  Name: [name] MRN: [id number] Date of Birth: 1951-04-20  Transition of Care [facility]) CM/SW [place], RN Phone Number: 06/22/2019, 10:47 AM  Clinical Narrative:   Met with the patient in the room, She is completely Alert and oriented.  I explained she is in her copay days with SNF, She stated that she can not afford that.  She [facility], She is open with [place] and would like to continue with them, I notified Corene Cornea with [facility], She has a new Wheelchair at home and a new lift chair her husband is home and will help her. Will continue to monitor for needs         Expected Discharge Plan and Services                                                 Social Determinants of Health (SDOH) Interventions    Readmission Risk Interventions Readmission Risk Prevention Plan 04/22/2019 02/04/2019 01/07/2019  Transportation Screening Complete - Complete  PCP or Specialist Appt within 3-5 Days - - Complete  HRI or [place] - Complete Complete  Social Work Consult for [place] Planning/Counseling - Complete Complete  Palliative Care Screening - Not Applicable Not Applicable  Medication Review Press photographer) Referral to Pharmacy - Complete  PCP or Specialist appointment within 3-5 days of discharge Complete - -  [place] or Home Care Consult Complete - -  SW Recovery Care/Counseling Consult Complete - -  Palliative Care Screening Not Applicable - -  Skilled Nursing Facility Complete - -  Some recent data might be hidden

## 2019-06-22 NOTE — Discharge Summary (Signed)
[name] [id number] DOB: 1951-04-05 DOA: 06/20/2019  PCP: Kirk Ruths, MD  Admit date: 06/20/2019 Discharge date: 06/22/2019  Admitted From: Home Disposition: Home  Recommendations for Outpatient Follow-up:  1. Follow up with PCP in 1 week 2. Please obtain BMP/CBC in one week 3. Primary neurologist as scheduled  Home Health: Yes   Discharge Condition:Stable CODE STATUS: Full Diet recommendation: Heart Healthy / Carb Modified  Brief/Interim Summary: [name] is a 69 y.o. female with medical history significant of hypertension, B-cell lymphoma, multiple sclerosis, urinary retention with chronic indwelling Foley who comes in for generalized weakness.  She reported not really trying to ambulate at home because she is not motivated.  She reports her husband is not very understanding of her situation and gets upset easily because she can do the things that she used to be able to do previously.  She was supposed to start physical therapy at home the day after admission.  Physical therapy and Occupational Therapy was consulted while in-house.  He recommended SNF however patient did not want to go due to cost and has opted for home health.  On admission she was found with acute on chronic kidney disease improved with IV fluids.  She was also more on hypotensive side and her blood pressure medications were held.  She was treated for left lower extremity cellulitis with Rocephin initially and then switched to Keflex.  Her urine culture came back Klebsiella pneumonia which she had back in December and is sensitive to ceftriaxone and cephalosporins.SHe received a dose of ceftriaxone today prior to discharge.  She had a CT on admission that did not reveal any acute intracranial abnormalities.  Moderate cerebral atrophy with extensive chronic microvascular ischemic changes and old left cerebellar infarct.  She was hydrated.  Electrolytes were repleted.  Her acute on chronic kidney  insufficiency improved.  We held her blood pressure meds as outpatient she will need to discuss this with her primary care.  Discharge Diagnoses:  Principal Problem:   Recurrent cellulitis of lower extremity Active Problems:   COPD with bronchial hyperresponsiveness (HCC)   Bladder neurogenesis   Multiple sclerosis (HCC)   Current tobacco use   Obstructive sleep apnea of adult   CKD (chronic kidney disease) stage 3, GFR 30-59 ml/min   Obesity, morbid (HCC)   Multiple falls   Generalized weakness    Discharge Instructions  Discharge Instructions    Diet - low sodium heart healthy   Complete by: As directed    Increase activity slowly   Complete by: As directed      Allergies as of 06/22/2019   No Known Allergies     Medication List    STOP taking these medications   amLODipine 5 MG tablet Commonly known as: NORVASC   hydrALAZINE 50 MG tablet Commonly known as: APRESOLINE   lisinopril 20 MG tablet Commonly known as: ZESTRIL     TAKE these medications   acetaminophen 325 MG tablet Commonly known as: TYLENOL Take 2 tablets (650 mg total) by mouth every 6 (six) hours as needed for mild pain (or Fever >/= 101).   albuterol 108 (90 Base) MCG/ACT inhaler Commonly known as: VENTOLIN HFA Inhale 1 puff into the lungs every 4 (four) hours as needed for shortness of breath.   atorvastatin 10 MG tablet Commonly known as: LIPITOR Take 10 mg by mouth daily.   Aubagio 14 MG Tabs Generic drug: Teriflunomide Take 14 mg by mouth daily.   baclofen 10 MG  tablet Commonly known as: LIORESAL Take 10 mg by mouth 3 (three) times daily.   buPROPion 300 MG 24 hr tablet Commonly known as: WELLBUTRIN XL Take 300 mg by mouth daily.   cephALEXin 500 MG capsule Commonly known as: KEFLEX Take 1 capsule (500 mg total) by mouth every 8 (eight) hours for 3 days.   clonazePAM 1 MG tablet Commonly known as: KLONOPIN Take 0.5 tablets (0.5 mg total) by mouth at bedtime. What changed:    how much to take  when to take this  reasons to take this   docusate sodium 100 MG capsule Commonly known as: COLACE Take 1 capsule (100 mg total) by mouth 2 (two) times daily. What changed:   when to take this  reasons to take this   DULoxetine 60 MG capsule Commonly known as: CYMBALTA Take 1 capsule (60 mg total) by mouth every morning.   gabapentin 600 MG tablet Commonly known as: NEURONTIN Take 1 tablet (600 mg total) by mouth 3 (three) times daily.   hydrocerin Crea Apply 1 application topically 2 (two) times daily.   interferon beta-1a 30 MCG/0.5ML Pskt injection Commonly known as: AVONEX Inject 30 mcg into the muscle every Monday.   magnesium oxide 400 (241.3 Mg) MG tablet Commonly known as: MAG-OX Take 1 tablet (400 mg total) by mouth daily.   multivitamin with minerals Tabs tablet Take 1 tablet by mouth every evening.   Myrbetriq 50 MG Tb24 tablet Generic drug: mirabegron ER TAKE ONE TABLET BY MOUTH ONCE DAILY What changed:   how much to take  how to take this  when to take this   polyethylene glycol 17 g packet Commonly known as: MIRALAX / GLYCOLAX Take 17 g by mouth daily as needed for mild constipation.   QUEtiapine Fumarate 150 MG 24 hr tablet Commonly known as: SEROQUEL XR Take 150 mg by mouth at bedtime.   Symbicort 160-4.5 MCG/ACT inhaler Generic drug: budesonide-formoterol Inhale 2 puffs into the lungs 2 (two) times daily.   tiZANidine 4 MG tablet Commonly known as: ZANAFLEX Take 4 mg by mouth 3 (three) times daily.   vitamin C 500 MG tablet Commonly known as: ASCORBIC ACID Take 500 mg by mouth every evening.      Follow-up Information    Kirk Ruths, MD Follow up in 1 week(s).   Specialty: Internal Medicine Why: Discussed blood pressure medications Contact information: [place] [id number] [phone]          No Known  Allergies  Consultations:  None   Procedures/Studies: CT Head Wo Contrast  Result Date: 06/20/2019 CLINICAL DATA:  69 year old female with history of head trauma. Headache. EXAM: CT HEAD WITHOUT CONTRAST TECHNIQUE: Contiguous axial images were obtained from the base of the skull through the vertex without intravenous contrast. COMPARISON:  Head CT 06/11/2018. FINDINGS: Brain: Moderate cerebral atrophy, most severe in the frontal lobes bilaterally. Patchy and confluent areas of decreased attenuation are noted throughout the deep and periventricular white matter of the cerebral hemispheres bilaterally, compatible with chronic microvascular ischemic disease. Linear well-defined area of low attenuation in the left cerebellar hemisphere, stable compared to the prior study, compatible with an old infarct. No evidence of acute infarction, hemorrhage, hydrocephalus, extra-axial collection or mass lesion/mass effect. Vascular: No hyperdense vessel or unexpected calcification. Skull: Normal. Negative for fracture or focal lesion. Sinuses/Orbits: No acute finding. Other: No priors. IMPRESSION: 1. No acute intracranial abnormalities. 2. Moderate cerebral atrophy (most severe  in the frontal regions) with extensive chronic microvascular ischemic changes and old left cerebellar infarct, as above. Electronically Signed   By: Vinnie Langton M.D.   On: 06/20/2019 13:31   US Venous Img Lower Unilateral Left  Result Date: 06/20/2019 CLINICAL DATA:  Left lower extremity pain and swelling after fall. EXAM: LEFT LOWER EXTREMITY VENOUS DOPPLER ULTRASOUND TECHNIQUE: Gray-scale sonography with compression, as well as color and duplex ultrasound, were performed to evaluate the deep venous system(s) from the level of the common femoral vein through the popliteal and proximal calf veins. COMPARISON:  08/28/2017 bilateral lower extremity venous Doppler scan. FINDINGS: VENOUS Normal compressibility of the common femoral,  superficial femoral, and popliteal veins, as well as the visualized calf veins. Visualized portions of profunda femoral vein and great saphenous vein unremarkable. No filling defects to suggest DVT on grayscale or color Doppler imaging. Doppler waveforms show normal direction of venous flow, normal respiratory phasicity and response to augmentation. Limited views of the contralateral common femoral vein are unremarkable. OTHER Subcutaneous edema noted in the distal left lower extremity. Limitations: none IMPRESSION: No deep venous thrombosis within the left lower extremity. If clinical symptoms are inconsistent or if there are persistent or worsening symptoms, further imaging (possibly involving the iliac veins) may be warranted. Electronically Signed   By: Ilona Sorrel M.D.   On: 06/20/2019 11:45       Subjective: Patient has no complaints.  Discharge Exam: Vitals:   06/21/19 2352 06/22/19 0742  BP: (!) 106/54 (!) 109/58  Pulse: 74 80  Resp: 14 17  Temp: (!) 97.5 F (36.4 C) 98 F (36.7 C)  SpO2: 92% 94%   Vitals:   06/21/19 0754 06/21/19 1642 06/21/19 2352 06/22/19 0742  BP: 109/65 123/65 (!) 106/54 (!) 109/58  Pulse: 76 79 74 80  Resp: 17 18 14 17   Temp: 98 F (36.7 C) 98.5 F (36.9 C) (!) 97.5 F (36.4 C) 98 F (36.7 C)  TempSrc: Oral Oral Oral Oral  SpO2: 99% 100% 92% 94%  Weight:      Height:        General: Pt is alert, awake and oriented, not in acute distress Cardiovascular: RRR, S1/S2 +, no rubs, no gallops Respiratory: CTA bilaterally, no wheezing, no rhonchi GU: Foley in place Abdominal: Soft, NT, ND, bowel sounds + Extremities: no edema, no cyanosis    The results of significant diagnostics from this hospitalization (including imaging, microbiology, ancillary and laboratory) are listed below for reference.     Microbiology: Recent Results (from the past 240 hour(s))  SARS CORONAVIRUS 2 (TAT 6-24 HRS) Nasopharyngeal Nasopharyngeal Swab     Status: None    Collection Time: 06/20/19 10:36 AM   Specimen: Nasopharyngeal Swab  Result Value Ref Range Status   SARS Coronavirus 2 NEGATIVE NEGATIVE Final    Comment: (NOTE) SARS-CoV-2 target nucleic acids are NOT DETECTED. The SARS-CoV-2 RNA is generally detectable in upper and lower respiratory specimens during the acute phase of infection. Negative results do not preclude SARS-CoV-2 infection, do not rule out co-infections with other pathogens, and should not be used as the sole basis for treatment or other patient management decisions. Negative results must be combined with clinical observations, patient history, and epidemiological information. The expected result is Negative. Fact Sheet for Patients: SugarRoll.be Fact Sheet for Healthcare Providers: https://www.woods-mathews.com/ This test is not yet approved or cleared by the [place] FDA and  has been authorized for detection and/or diagnosis of SARS-CoV-2 by FDA under an Emergency  Use Authorization (EUA). This EUA will remain  in effect (meaning this test can be used) for the duration of the COVID-19 declaration under Section 56 4(b)(1) of the Act, 21 U.S.C. section 360bbb-3(b)(1), unless the authorization is terminated or revoked sooner. Performed at [facility], [place] [address], [place], [place] [address]   GI pathogen panel by PCR, stool     Status: None   Collection Time: 06/20/19 10:55 AM   Specimen: Stool  Result Value Ref Range Status   Plesiomonas shigelloides NOT DETECTED NOT DETECTED Final   Yersinia enterocolitica NOT DETECTED NOT DETECTED Final   Vibrio NOT DETECTED NOT DETECTED Final   Enteropathogenic E coli NOT DETECTED NOT DETECTED Final   E coli (ETEC) LT/ST NOT DETECTED NOT DETECTED Final   E coli [id number] by PCR Not applicable NOT DETECTED Final   Cryptosporidium by PCR NOT DETECTED NOT DETECTED Final   Entamoeba histolytica NOT DETECTED NOT DETECTED Final    Adenovirus F 40/41 NOT DETECTED NOT DETECTED Final   Norovirus GI/GII NOT DETECTED NOT DETECTED Final   Sapovirus NOT DETECTED NOT DETECTED Final    Comment: (NOTE) Performed At: [facility] [place], [place] [id number] Rush Farmer MD RW:[id number]    Vibrio cholerae NOT DETECTED NOT DETECTED Final   Campylobacter by PCR NOT DETECTED NOT DETECTED Final   Salmonella by PCR NOT DETECTED NOT DETECTED Final   E coli (STEC) NOT DETECTED NOT DETECTED Final   Enteroaggregative E coli NOT DETECTED NOT DETECTED Final   Shigella by PCR NOT DETECTED NOT DETECTED Final   Cyclospora cayetanensis NOT DETECTED NOT DETECTED Final   Astrovirus NOT DETECTED NOT DETECTED Final   G lamblia by PCR NOT DETECTED NOT DETECTED Final   Rotavirus A by PCR NOT DETECTED NOT DETECTED Final  C Difficile Quick Screen w PCR reflex     Status: Abnormal   Collection Time: 06/20/19 10:55 AM   Specimen: Stool  Result Value Ref Range Status   C Diff antigen POSITIVE (A) NEGATIVE Final   C Diff toxin NEGATIVE NEGATIVE Final   C Diff interpretation Results are indeterminate. See PCR results.  Final    Comment: Performed at [facility], [address] [place], [place] [address]  C. Diff by PCR, Reflexed     Status: None   Collection Time: 06/20/19 10:55 AM  Result Value Ref Range Status   Toxigenic C. Difficile by PCR NEGATIVE NEGATIVE Final    Comment: Patient is colonized with non toxigenic C. difficile. May not need treatment unless significant symptoms are present. Performed at [facility], [address] [place], [place] [address]   Urine culture     Status: Abnormal   Collection Time: 06/20/19 10:55 AM   Specimen: Urine, Random  Result Value Ref Range Status   Specimen Description   Final    URINE, RANDOM Performed at [facility], [address], [place], [place] [address]    Special Requests   Final    NONE Performed at [facility], [address] [place], [place] [address]    Culture >=100,000 COLONIES/mL KLEBSIELLA PNEUMONIAE (A)  Final   Report Status 06/22/2019 FINAL  Final   Organism ID, Bacteria KLEBSIELLA PNEUMONIAE (A)  Final      Susceptibility   Klebsiella pneumoniae - MIC*    AMPICILLIN >=32 RESISTANT Resistant     CEFAZOLIN <=4 SENSITIVE Sensitive     CEFTRIAXONE <=0.25 SENSITIVE Sensitive  CIPROFLOXACIN <=0.25 SENSITIVE Sensitive     GENTAMICIN <=1 SENSITIVE Sensitive     IMIPENEM <=0.25 SENSITIVE Sensitive     NITROFURANTOIN 32 SENSITIVE Sensitive     TRIMETH/SULFA <=20 SENSITIVE Sensitive     AMPICILLIN/SULBACTAM 4 SENSITIVE Sensitive     PIP/TAZO <=4 SENSITIVE Sensitive     * >=100,000 COLONIES/mL KLEBSIELLA PNEUMONIAE     Labs: BNP (last 3 results) No results for input(s): BNP in the last 8760 hours. Basic Metabolic Panel: Recent Labs  Lab 06/20/19 1055 06/20/19 1827 06/21/19 0357  NA 135  --  140  K 3.8  --  3.7  CL 102  --  107  CO2 25  --  24  GLUCOSE 88  --  100*  BUN 31*  --  23  CREATININE 1.35* 1.20* 1.07*  CALCIUM 8.8*  --  8.9  MG  --   --  1.5*   Liver Function Tests: Recent Labs  Lab 06/20/19 1055 06/21/19 0357  AST 17 16  ALT 15 15  ALKPHOS 78 72  BILITOT 0.6 0.6  PROT 6.8 5.9*  ALBUMIN 3.4* 2.9*   No results for input(s): LIPASE, AMYLASE in the last 168 hours. No results for input(s): AMMONIA in the last 168 hours. CBC: Recent Labs  Lab 06/20/19 1055 06/20/19 1827 06/21/19 0357  WBC 7.7 6.1 5.4  NEUTROABS 6.0  --  3.9  HGB 10.4* 10.7* 9.8*  HCT 33.0* 33.7* 31.2*  MCV 86.4 86.4 87.6  PLT 241 238 231   Cardiac Enzymes: No results for input(s): CKTOTAL, CKMB, CKMBINDEX, TROPONINI in the last 168 hours. BNP: Invalid input(s): POCBNP CBG: No results for input(s): GLUCAP in the last 168 hours. D-Dimer No results for input(s): DDIMER in the last 72 hours. Hgb A1c Recent Labs    06/20/19 1827  HGBA1C 5.0   Lipid Profile No results for  input(s): CHOL, HDL, LDLCALC, TRIG, CHOLHDL, LDLDIRECT in the last 72 hours. Thyroid function studies Recent Labs    06/20/19 1826  TSH 1.127   Anemia work up No results for input(s): VITAMINB12, FOLATE, FERRITIN, TIBC, IRON, RETICCTPCT in the last 72 hours. Urinalysis    Component Value Date/Time   COLORURINE YELLOW (A) 06/20/2019 1055   APPEARANCEUR CLEAR (A) 06/20/2019 1055   APPEARANCEUR Hazy 08/02/2013 0020   LABSPEC 1.008 06/20/2019 1055   LABSPEC 1.012 08/02/2013 0020   PHURINE 5.0 06/20/2019 1055   GLUCOSEU NEGATIVE 06/20/2019 1055   GLUCOSEU Negative 08/02/2013 0020   HGBUR NEGATIVE 06/20/2019 1055   BILIRUBINUR NEGATIVE 06/20/2019 1055   BILIRUBINUR Negative 08/02/2013 0020   KETONESUR NEGATIVE 06/20/2019 1055   PROTEINUR NEGATIVE 06/20/2019 1055   NITRITE NEGATIVE 06/20/2019 1055   LEUKOCYTESUR SMALL (A) 06/20/2019 1055   LEUKOCYTESUR 2+ 08/02/2013 0020   Sepsis Labs Invalid input(s): PROCALCITONIN,  WBC,  LACTICIDVEN Microbiology Recent Results (from the past 240 hour(s))  SARS CORONAVIRUS 2 (TAT 6-24 HRS) Nasopharyngeal Nasopharyngeal Swab     Status: None   Collection Time: 06/20/19 10:36 AM   Specimen: Nasopharyngeal Swab  Result Value Ref Range Status   SARS Coronavirus 2 NEGATIVE NEGATIVE Final    Comment: (NOTE) SARS-CoV-2 target nucleic acids are NOT DETECTED. The SARS-CoV-2 RNA is generally detectable in upper and lower respiratory specimens during the acute phase of infection. Negative results do not preclude SARS-CoV-2 infection, do not rule out co-infections with other pathogens, and should not be used as the sole basis for treatment or other patient management decisions. Negative results must be combined  with clinical observations, patient history, and epidemiological information. The expected result is Negative. Fact Sheet for Patients: SugarRoll.be Fact Sheet for Healthcare  Providers: https://www.woods-mathews.com/ This test is not yet approved or cleared by the [place] FDA and  has been authorized for detection and/or diagnosis of SARS-CoV-2 by FDA under an Emergency Use Authorization (EUA). This EUA will remain  in effect (meaning this test can be used) for the duration of the COVID-19 declaration under Section 56 4(b)(1) of the Act, 21 U.S.C. section 360bbb-3(b)(1), unless the authorization is terminated or revoked sooner. Performed at [facility], [place] [address] [place], [place] [address]   GI pathogen panel by PCR, stool     Status: None   Collection Time: 06/20/19 10:55 AM   Specimen: Stool  Result Value Ref Range Status   Plesiomonas shigelloides NOT DETECTED NOT DETECTED Final   Yersinia enterocolitica NOT DETECTED NOT DETECTED Final   Vibrio NOT DETECTED NOT DETECTED Final   Enteropathogenic E coli NOT DETECTED NOT DETECTED Final   E coli (ETEC) LT/ST NOT DETECTED NOT DETECTED Final   E coli [id number] by PCR Not applicable NOT DETECTED Final   Cryptosporidium by PCR NOT DETECTED NOT DETECTED Final   Entamoeba histolytica NOT DETECTED NOT DETECTED Final   Adenovirus F 40/41 NOT DETECTED NOT DETECTED Final   Norovirus GI/GII NOT DETECTED NOT DETECTED Final   Sapovirus NOT DETECTED NOT DETECTED Final    Comment: (NOTE) Performed At: [facility] [place], [place] [id number] Rush Farmer MD RW:[id number]    Vibrio cholerae NOT DETECTED NOT DETECTED Final   Campylobacter by PCR NOT DETECTED NOT DETECTED Final   Salmonella by PCR NOT DETECTED NOT DETECTED Final   E coli (STEC) NOT DETECTED NOT DETECTED Final   Enteroaggregative E coli NOT DETECTED NOT DETECTED Final   Shigella by PCR NOT DETECTED NOT DETECTED Final   Cyclospora cayetanensis NOT DETECTED NOT DETECTED Final   Astrovirus NOT DETECTED NOT DETECTED Final   G lamblia by PCR NOT DETECTED NOT DETECTED Final   Rotavirus A by PCR NOT DETECTED  NOT DETECTED Final  C Difficile Quick Screen w PCR reflex     Status: Abnormal   Collection Time: 06/20/19 10:55 AM   Specimen: Stool  Result Value Ref Range Status   C Diff antigen POSITIVE (A) NEGATIVE Final   C Diff toxin NEGATIVE NEGATIVE Final   C Diff interpretation Results are indeterminate. See PCR results.  Final    Comment: Performed at [facility], [address] [place], [place] [address]  C. Diff by PCR, Reflexed     Status: None   Collection Time: 06/20/19 10:55 AM  Result Value Ref Range Status   Toxigenic C. Difficile by PCR NEGATIVE NEGATIVE Final    Comment: Patient is colonized with non toxigenic C. difficile. May not need treatment unless significant symptoms are present. Performed at [facility], [address] [place], [place] [address]   Urine culture     Status: Abnormal   Collection Time: 06/20/19 10:55 AM   Specimen: Urine, Random  Result Value Ref Range Status   Specimen Description   Final    URINE, RANDOM Performed at [facility], [address] [place], [place] [address]    Special Requests   Final    NONE Performed at [facility], [address] [place], [place] [address]    Culture >=100,000 COLONIES/mL KLEBSIELLA PNEUMONIAE (A)  Final   Report  Status 06/22/2019 FINAL  Final   Organism ID, Bacteria KLEBSIELLA PNEUMONIAE (A)  Final      Susceptibility   Klebsiella pneumoniae - MIC*    AMPICILLIN >=32 RESISTANT Resistant     CEFAZOLIN <=4 SENSITIVE Sensitive     CEFTRIAXONE <=0.25 SENSITIVE Sensitive     CIPROFLOXACIN <=0.25 SENSITIVE Sensitive     GENTAMICIN <=1 SENSITIVE Sensitive     IMIPENEM <=0.25 SENSITIVE Sensitive     NITROFURANTOIN 32 SENSITIVE Sensitive     TRIMETH/SULFA <=20 SENSITIVE Sensitive     AMPICILLIN/SULBACTAM 4 SENSITIVE Sensitive     PIP/TAZO <=4 SENSITIVE Sensitive     * >=100,000 COLONIES/mL KLEBSIELLA PNEUMONIAE     Time coordinating discharge: Over 30  minutes  SIGNED:   Nolberto Hanlon, MD  [facility] 06/22/2019, 11:03 AM Pager   If 7PM-7AM, please contact night-coverage [email or web address] Password [email or web address]

## 2019-06-22 NOTE — TOC Transition Note (Signed)
Transition of Care [facility]) - CM/SW Discharge Note   Patient Details  Name: [name] MRN: [id number] Date of Birth: Nov 01, 1950  Transition of Care [facility]) CM/SW Contact:  Su Hilt, RN Phone Number: 06/22/2019, 1:37 PM   Clinical Narrative:     Towanda Octave the patient's husband left a VM requested a call back [name] called me immediately back  I explained to the husband that the patient is to Discharge today and I was wanting to clarify who would be picking the patient up, He stated that he will be picking her up but has a dr's appointment at the moment it will be after that.  He will bring her clothes , I notified the Nurse Lauren        Patient Goals and CMS Choice        Discharge Placement                       Discharge Plan and Services                                     Social Determinants of Health (SDOH) Interventions     Readmission Risk Interventions Readmission Risk Prevention Plan 04/22/2019 02/04/2019 01/07/2019  Transportation Screening Complete - Complete  PCP or Specialist Appt within 3-5 Days - - Complete  HRI or Home Care Consult - Complete Complete  Social Work Consult for [place] Planning/Counseling - Complete Complete  Palliative Care Screening - Not Applicable Not Applicable  Medication Review Press photographer) Referral to Pharmacy - Complete  PCP or Specialist appointment within 3-5 days of discharge Complete - -  [place] or Home Care Consult Complete - -  SW Recovery Care/Counseling Consult Complete - -  Palliative Care Screening Not Applicable - -  Skilled Nursing Facility Complete - -  Some recent data might be hidden

## 2019-06-22 NOTE — Progress Notes (Signed)
Occupational Therapy Treatment Patient Details Name: [name] MRN: [id number] DOB: [birth year] Today's Date: 06/22/2019    History of present illness Pt is a 69 y.o. female that presented to ED for weakness and falls at home. PMH of with medical history significant of hypertension, B-cell lymphoma, multiple sclerosis, urinary retention with chronic indwelling Foley. Patient stated that she was admitted to the hospital back in December and was at a rehab facility [facility] per chart and pt) but was discharged home about a week ago.   OT comments  Patient seen this afternoon for OT session.  Patient somewhat emotional, stating she was being discharged.  Patient states she is not sure if she is ready.  Attempted to assist patient in exercise, sitting edge of bed, and encouraging to sit in recliner.  Patient states she would rather be in bed and wait for her clothes to arrive.  Assisted paint in log rolling to aid in comfort.  Provided education regarding energy conservation techniques at home to improve overall safety.  Provided education on body mechanics and self pacing during functional transfers to improve overall safety.  Patient verbalized understanding of education.  Based on today's performance, continuing to recommend SNF placement.  The patient would benefit from further skilled OT services to address strengthening, activity tolerance, energy conservation techniques, safety awareness and ADL retraining.       Follow Up Recommendations  SNF    Equipment Recommendations       Recommendations for Other Services      Precautions / Restrictions Precautions Precautions: Fall Restrictions Weight Bearing Restrictions: No       Mobility Bed Mobility Overal bed mobility: Needs Assistance Bed Mobility: Rolling Rolling: Mod assist(for B LEs)         General bed mobility comments: Requires use of grab bars  Transfers                      Balance                                            ADL either performed or assessed with clinical judgement   ADL Overall ADL's : Needs assistance/impaired                                             Vision   Vision Assessment?: No apparent visual deficits   Perception     Praxis      Cognition Arousal/Alertness: Awake/alert Behavior During Therapy: WFL for tasks assessed/performed Overall Cognitive Status: Within Functional Limits for tasks assessed                                          Exercises Other Exercises Other Exercises: Educated patient on use of energy conservation techniques Other Exercises: Educated patient on safe functional transfers   Shoulder Instructions       General Comments      Pertinent Vitals/ Pain       Pain Assessment: No/denies pain  Home Living  Prior Functioning/Environment              Frequency  Min 2X/week        Progress Toward Goals  OT Goals(current goals can now be found in the care plan section)  Progress towards OT goals: OT to reassess next treatment     Plan Discharge plan remains appropriate    Co-evaluation                 AM-PAC OT "6 Clicks" Daily Activity     Outcome Measure                    End of Session    OT Visit Diagnosis: Repeated falls (R29.6);Muscle weakness (generalized) (M62.81)   Activity Tolerance     Patient Left in bed;with call bell/phone within reach;with bed alarm set   Nurse Communication          Time: [id number] OT Time Calculation (min): 24 min  Charges: OT General Charges $OT Visit: 1 Visit OT Treatments $Therapeutic Activity: 23-37 mins  Baldomero Lamy, MS, OTR/L 06/22/19, 4:17 PM

## 2019-06-22 NOTE — TOC Progression Note (Signed)
Transition of Care [facility]) - Progression Note    Patient Details  Name: [name] MRN: [id number] Date of Birth: 05/03/1950  Transition of Care [facility]) CM/SW Contact  Su Hilt, RN Phone Number: 06/22/2019, 4:07 PM  Clinical Narrative:   Received a call from the friend [name], she stated that the patietn called her husband and said she cant get into a car that EMS will need to take her home, I notified the nurse that I was getting the EMS papers together and would call, The husband will bring the patient clothing         Expected Discharge Plan and Services           Expected Discharge Date: 06/22/19                                     Social Determinants of Health (SDOH) Interventions    Readmission Risk Interventions Readmission Risk Prevention Plan 04/22/2019 02/04/2019 01/07/2019  Transportation Screening Complete - Complete  PCP or Specialist Appt within 3-5 Days - - Complete  HRI or Home Care Consult - Complete Complete  Social Work Consult for [place] Planning/Counseling - Complete Complete  Palliative Care Screening - Not Applicable Not Applicable  Medication Review Press photographer) Referral to Pharmacy - Complete  PCP or Specialist appointment within 3-5 days of discharge Complete - -  HRI or Home Care Consult Complete - -  SW Recovery Care/Counseling Consult Complete - -  Palliative Care Screening Not Applicable - -  Skilled Nursing Facility Complete - -  Some recent data might be hidden

## 2019-06-22 NOTE — TOC Progression Note (Signed)
Transition of Care [facility]) - Progression Note    Patient Details  Name: [name] MRN: [id number] Date of Birth: [birth year]  Transition of Care [facility]) CM/SW [place], RN Phone Number: 06/22/2019, 12:02 PM  Clinical Narrative:            Expected Discharge Plan and Services    Called the husband to verify who would be transporting the patient home for DC today, I left a vm requesting a call back and provided my contact information       Expected Discharge Date: 06/22/19                                     Social Determinants of Health (SDOH) Interventions    Readmission Risk Interventions Readmission Risk Prevention Plan 04/22/2019 02/04/2019 01/07/2019  Transportation Screening Complete - Complete  PCP or Specialist Appt within 3-5 Days - - Complete  HRI or [place] - Complete Complete  Social Work Consult for [place] Planning/Counseling - Complete Complete  Palliative Care Screening - Not Applicable Not Applicable  Medication Review Press photographer) Referral to Pharmacy - Complete  PCP or Specialist appointment within 3-5 days of discharge Complete - -  [place] or Home Care Consult Complete - -  SW Recovery Care/Counseling Consult Complete - -  Palliative Care Screening Not Applicable - -  Skilled Nursing Facility Complete - -  Some recent data might be hidden

## 2019-06-22 NOTE — Progress Notes (Signed)
Went over discharge instructions with patient. Awaiting EMS transport to home. Patient verbalizes understanding. Foley care/ peri care completed. Education given to patient on how to clean at home. Discharge packet given to patient.

## 2019-08-21 IMAGING — US US EXTREM LOW VENOUS*L*
1 series · 13 of 24 positions shown · non-contrast
Comparison: 02/07/2017

CLINICAL DATA: Left leg pain, swelling, and erythema for 2 months.
Follicular lymphoma.



[Series 1: us extrem low venous*left* · 0.10mm/px · 13 of 35 slices shown]
[im 1/35]
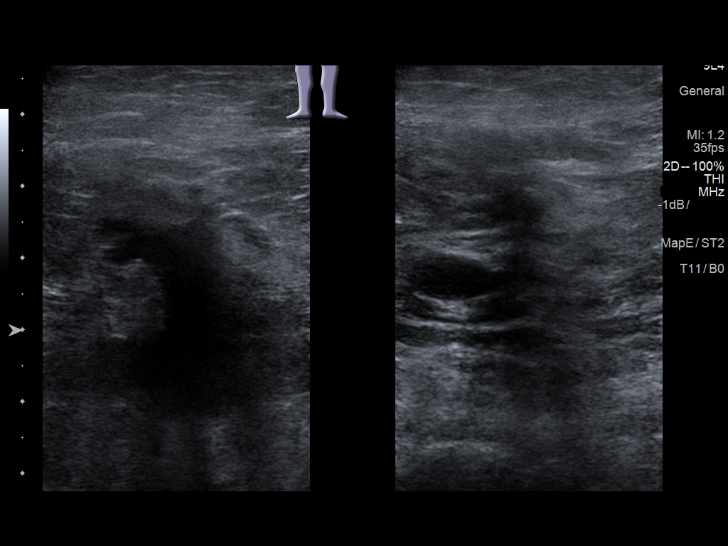
[im 3/35]
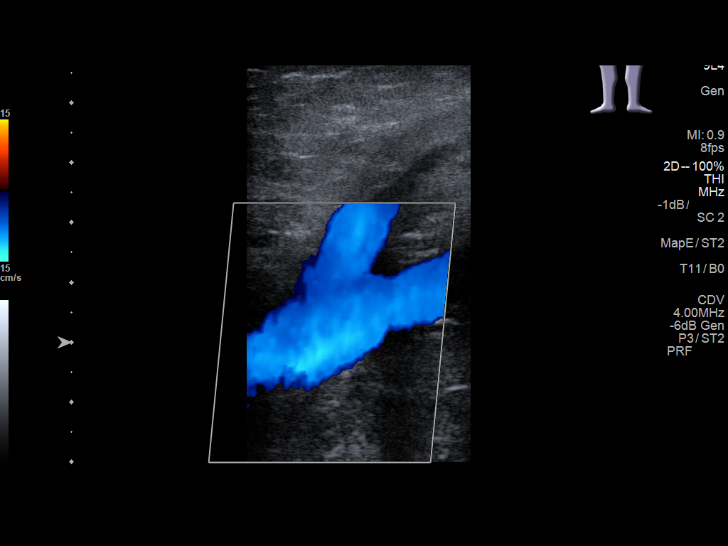
[im 6/35]
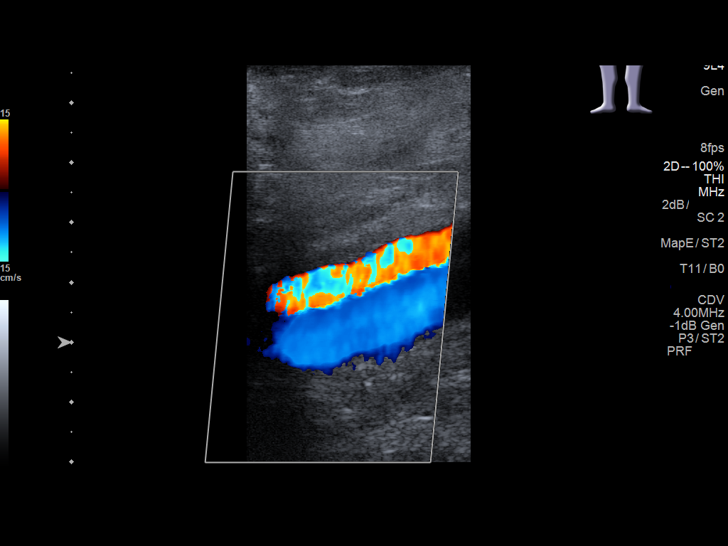
[im 9/35]
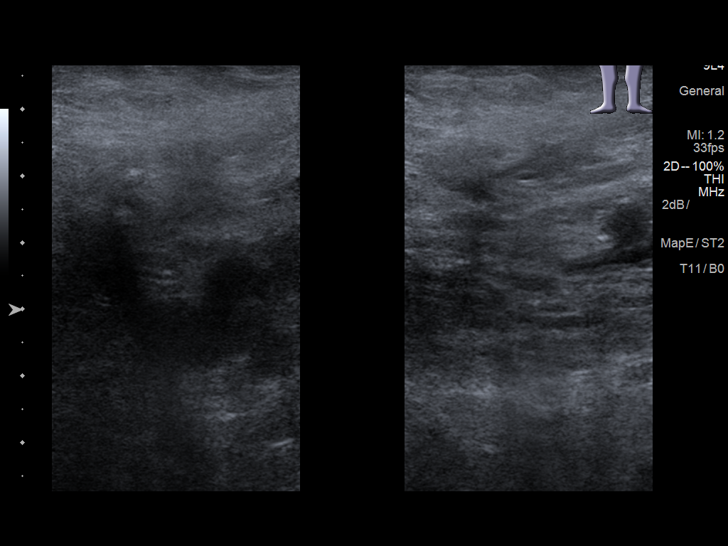
[im 12/35]
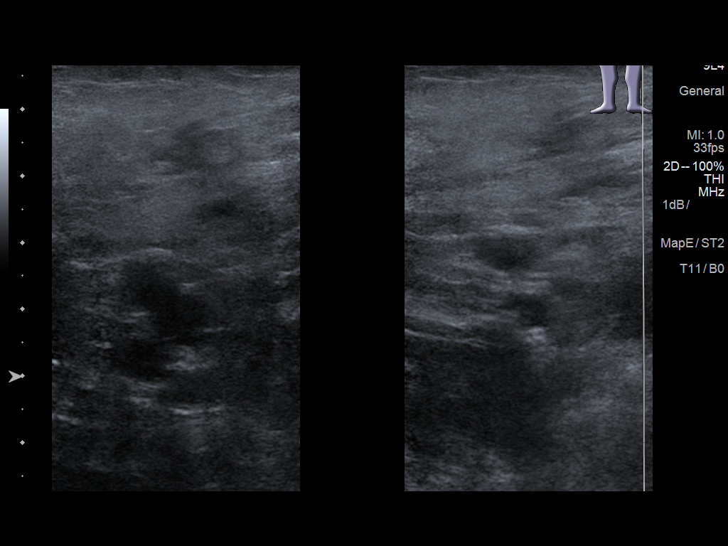
[im 15/35]
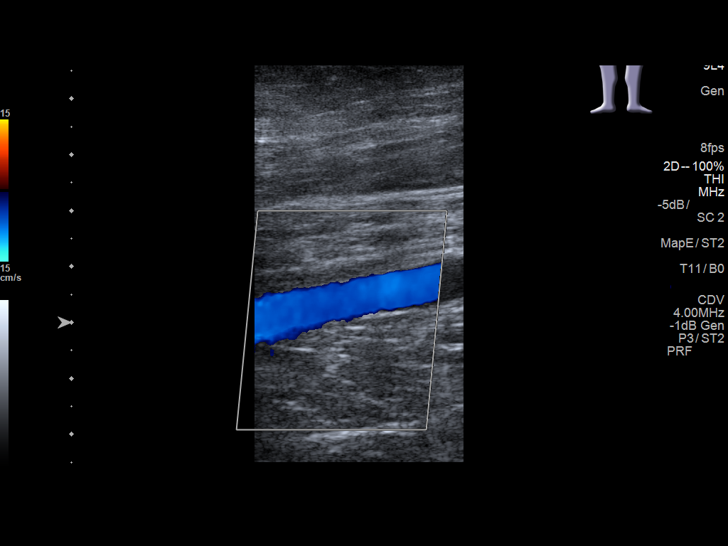
[im 18/35]
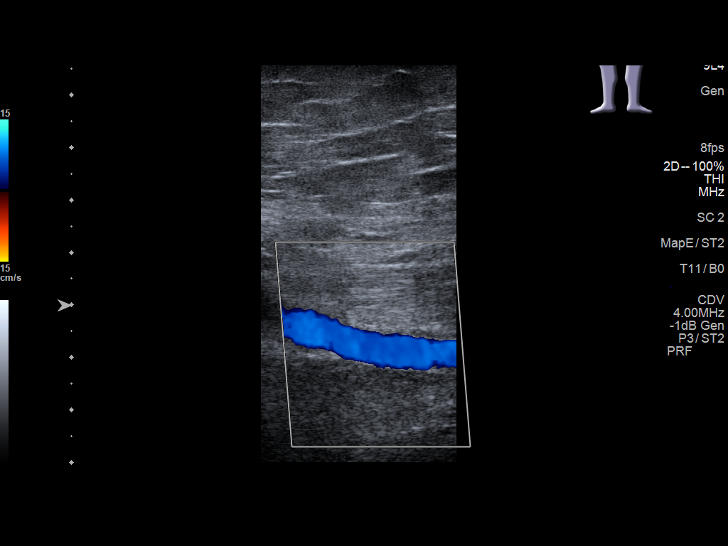
[im 20/35]
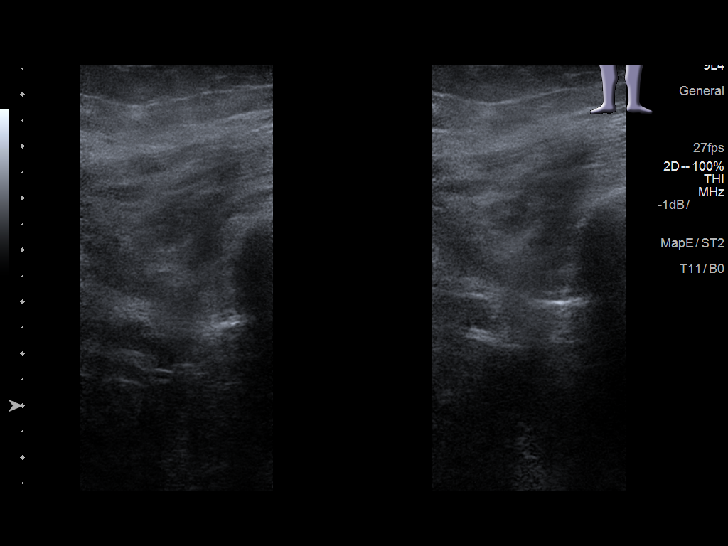
[im 23/35]
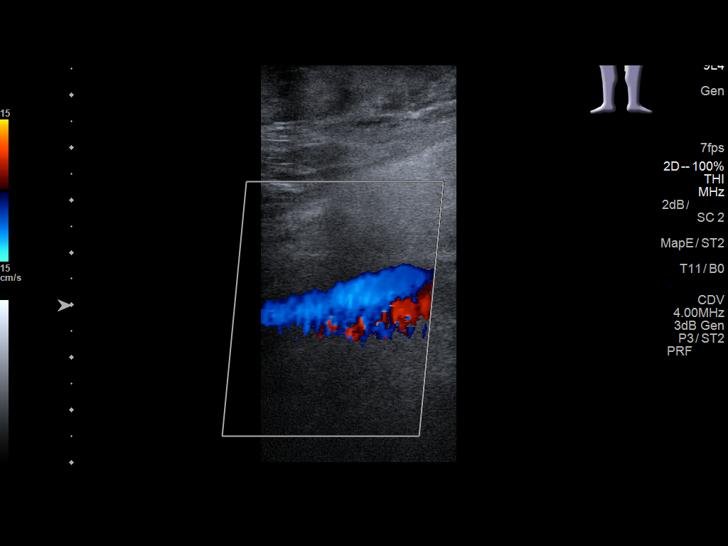
[im 26/35]
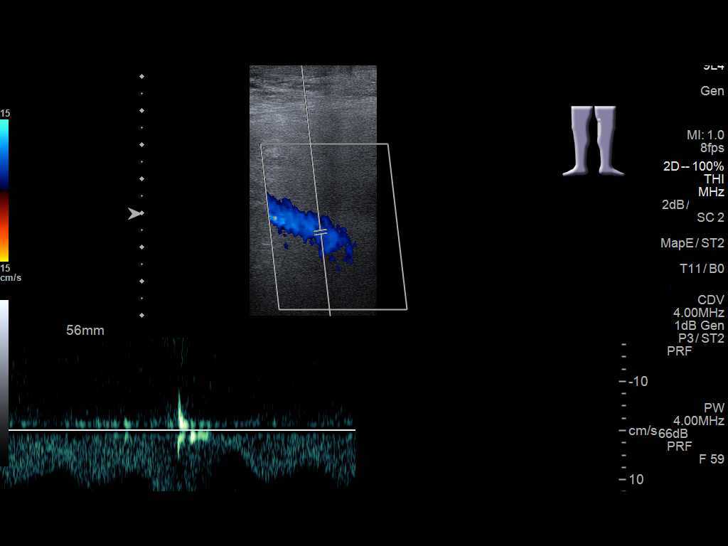
[im 29/35]
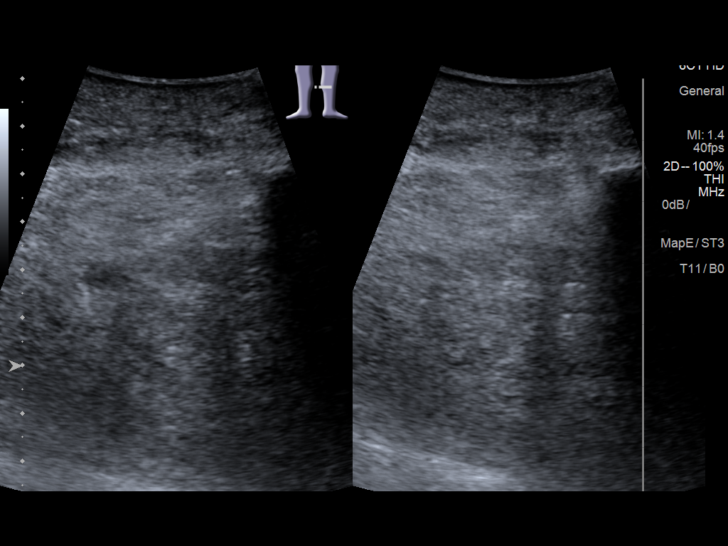
[im 32/35]
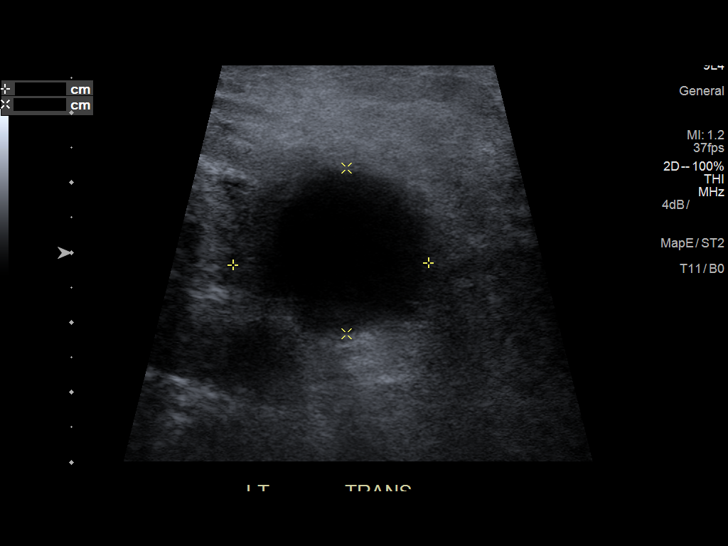
[im 35/35]
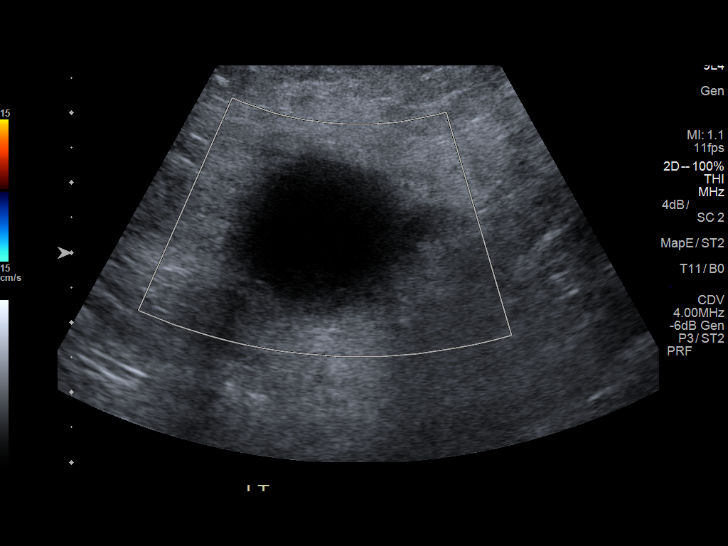

[13 of 24 positions shown; findings below may reference images not displayed]

FINDINGS: Contralateral Common Femoral Vein: Respiratory phasicity is normal
and symmetric with the symptomatic side. No evidence of thrombus.
Normal compressibility.

Common Femoral Vein: No evidence of thrombus. Normal
compressibility, respiratory phasicity and response to augmentation.

Saphenofemoral Junction: No evidence of thrombus. Normal
compressibility and flow on color Doppler imaging.

Profunda Femoral Vein: No evidence of thrombus. Normal
compressibility and flow on color Doppler imaging.

Femoral Vein: No evidence of thrombus. Normal compressibility,
respiratory phasicity and response to augmentation.

Popliteal Vein: No evidence of thrombus. Normal compressibility,
respiratory phasicity and response to augmentation.

Calf Veins: No evidence of thrombus. Normal compressibility and flow
on color Doppler imaging.

Superficial Great Saphenous Vein: No evidence of thrombus. Normal
compressibility.

Venous Reflux:  None.

Other Findings: Soft tissue edema is seen within the left groin and
lower extremity soft tissues. A focal fluid collection in the left
groin and currently measures 2.8 x 2.4 x 2.7 cm, mildly decreased in
size since previous study.
IMPRESSION: No evidence of deep venous thrombosis.

Left groin and lower extremity soft tissue edema. Mild decrease in
size of left groin fluid collection since recent exam.

## 2019-08-31 ENCOUNTER — Telehealth: Payer: Self-pay | Admitting: *Deleted

## 2019-08-31 NOTE — Telephone Encounter (Signed)
Patient called stating she needs help at home, but she does not have MCD and is asking what to do. Her next appointment with Dr Janese Banks is 09/06/19

## 2019-08-31 NOTE — Telephone Encounter (Signed)
Call returned to patient and advised that she needs to contact her PCP about this issue. She states that she did call his office and has not heard back form him

## 2019-08-31 NOTE — Telephone Encounter (Signed)
She should be talking to her PCP about this.

## 2019-09-06 ENCOUNTER — Inpatient Hospital Stay (HOSPITAL_BASED_OUTPATIENT_CLINIC_OR_DEPARTMENT_OTHER): Payer: Medicare Other | Admitting: Oncology

## 2019-09-06 ENCOUNTER — Other Ambulatory Visit: Payer: Self-pay

## 2019-09-06 ENCOUNTER — Inpatient Hospital Stay: Payer: Medicare Other | Attending: Oncology

## 2019-09-06 ENCOUNTER — Encounter: Payer: Self-pay | Admitting: Oncology

## 2019-09-06 VITALS — BP 121/51 | HR 78 | Temp 96.5°F | Resp 18

## 2019-09-06 DIAGNOSIS — Z811 Family history of alcohol abuse and dependence: Secondary | ICD-10-CM | POA: Diagnosis not present

## 2019-09-06 DIAGNOSIS — G35 Multiple sclerosis: Secondary | ICD-10-CM | POA: Diagnosis not present

## 2019-09-06 DIAGNOSIS — Z8261 Family history of arthritis: Secondary | ICD-10-CM | POA: Insufficient documentation

## 2019-09-06 DIAGNOSIS — Z79899 Other long term (current) drug therapy: Secondary | ICD-10-CM | POA: Diagnosis not present

## 2019-09-06 DIAGNOSIS — Z836 Family history of other diseases of the respiratory system: Secondary | ICD-10-CM | POA: Diagnosis not present

## 2019-09-06 DIAGNOSIS — Z823 Family history of stroke: Secondary | ICD-10-CM | POA: Diagnosis not present

## 2019-09-06 DIAGNOSIS — F419 Anxiety disorder, unspecified: Secondary | ICD-10-CM | POA: Diagnosis not present

## 2019-09-06 DIAGNOSIS — F329 Major depressive disorder, single episode, unspecified: Secondary | ICD-10-CM | POA: Insufficient documentation

## 2019-09-06 DIAGNOSIS — Z841 Family history of disorders of kidney and ureter: Secondary | ICD-10-CM | POA: Insufficient documentation

## 2019-09-06 DIAGNOSIS — Z833 Family history of diabetes mellitus: Secondary | ICD-10-CM | POA: Diagnosis not present

## 2019-09-06 DIAGNOSIS — Z818 Family history of other mental and behavioral disorders: Secondary | ICD-10-CM | POA: Diagnosis not present

## 2019-09-06 DIAGNOSIS — C8293 Follicular lymphoma, unspecified, intra-abdominal lymph nodes: Secondary | ICD-10-CM

## 2019-09-06 DIAGNOSIS — K5900 Constipation, unspecified: Secondary | ICD-10-CM | POA: Diagnosis not present

## 2019-09-06 DIAGNOSIS — Z8249 Family history of ischemic heart disease and other diseases of the circulatory system: Secondary | ICD-10-CM | POA: Diagnosis not present

## 2019-09-06 DIAGNOSIS — Z87440 Personal history of urinary (tract) infections: Secondary | ICD-10-CM | POA: Diagnosis not present

## 2019-09-06 DIAGNOSIS — Z923 Personal history of irradiation: Secondary | ICD-10-CM | POA: Insufficient documentation

## 2019-09-06 DIAGNOSIS — C8200 Follicular lymphoma grade I, unspecified site: Secondary | ICD-10-CM | POA: Insufficient documentation

## 2019-09-06 DIAGNOSIS — R400 Somnolence: Secondary | ICD-10-CM | POA: Insufficient documentation

## 2019-09-06 DIAGNOSIS — Z87891 Personal history of nicotine dependence: Secondary | ICD-10-CM | POA: Diagnosis not present

## 2019-09-06 DIAGNOSIS — J90 Pleural effusion, not elsewhere classified: Secondary | ICD-10-CM | POA: Diagnosis not present

## 2019-09-06 LAB — CBC WITH DIFFERENTIAL/PLATELET
Abs Immature Granulocytes: 0.03 10*3/uL (ref 0.00–0.07)
Basophils Absolute: 0.1 10*3/uL (ref 0.0–0.1)
Basophils Relative: 1 %
Eosinophils Absolute: 0.3 10*3/uL (ref 0.0–0.5)
Eosinophils Relative: 5 %
HCT: 36 % (ref 36.0–46.0)
Hemoglobin: 11.6 g/dL — ABNORMAL LOW (ref 12.0–15.0)
Immature Granulocytes: 1 %
Lymphocytes Relative: 17 %
Lymphs Abs: 1 10*3/uL (ref 0.7–4.0)
MCH: 27.6 pg (ref 26.0–34.0)
MCHC: 32.2 g/dL (ref 30.0–36.0)
MCV: 85.5 fL (ref 80.0–100.0)
Monocytes Absolute: 0.7 10*3/uL (ref 0.1–1.0)
Monocytes Relative: 10 %
Neutro Abs: 4.2 10*3/uL (ref 1.7–7.7)
Neutrophils Relative %: 66 %
Platelets: 219 10*3/uL (ref 150–400)
RBC: 4.21 MIL/uL (ref 3.87–5.11)
RDW: 14.8 % (ref 11.5–15.5)
WBC: 6.3 10*3/uL (ref 4.0–10.5)
nRBC: 0 % (ref 0.0–0.2)

## 2019-09-06 LAB — COMPREHENSIVE METABOLIC PANEL
ALT: 17 U/L (ref 0–44)
AST: 20 U/L (ref 15–41)
Albumin: 3.7 g/dL (ref 3.5–5.0)
Alkaline Phosphatase: 65 U/L (ref 38–126)
Anion gap: 10 (ref 5–15)
BUN: 22 mg/dL (ref 8–23)
CO2: 27 mmol/L (ref 22–32)
Calcium: 8.8 mg/dL — ABNORMAL LOW (ref 8.9–10.3)
Chloride: 100 mmol/L (ref 98–111)
Creatinine, Ser: 1.21 mg/dL — ABNORMAL HIGH (ref 0.44–1.00)
GFR calc Af Amer: 53 mL/min — ABNORMAL LOW (ref >60)
GFR calc non Af Amer: 46 mL/min — ABNORMAL LOW (ref >60)
Glucose, Bld: 128 mg/dL — ABNORMAL HIGH (ref 70–99)
Potassium: 4.2 mmol/L (ref 3.5–5.1)
Sodium: 137 mmol/L (ref 135–145)
Total Bilirubin: 0.5 mg/dL (ref 0.3–1.2)
Total Protein: 7 g/dL (ref 6.5–8.1)

## 2019-09-06 LAB — LACTATE DEHYDROGENASE: LDH: 134 U/L (ref 98–192)

## 2019-09-06 NOTE — Progress Notes (Signed)
Patient here for oncology follow-up appointment, unable to stand for weight. Foley in place. Pt expresses concerns of numbness in limbs (not new) and "bump of tu-tu" per patient states she will mention to Dr.

## 2019-09-09 NOTE — Progress Notes (Signed)
Hematology/Oncology Consult note [facility]  [phone] [phone]  Patient Care Team: Kirk Ruths, MD as PCP - General (Internal Medicine) Sanda Klein, Satira Anis, MD as Attending Physician (Family Medicine) Christene Lye, MD (General Surgery) Clent Jacks, RN as Registered Nurse Abbie Sons, MD (Urology)   Name of the patient: [name]  [id number]  01/09/1951   Date of visit: 09/09/19  Diagnosis- Stage II follicular lymphomas/p 4 doses of rituxan. Currently under observation  Chief complaint/ Reason for visit-routine follow-up of follicular lymphoma  Heme/Onc history: 1. patient is a 69 year old female who has had ongoing problems with pain in her left groin as well as his left leg swelling.   2. She had a Doppler of her left lower extremity on 02/28/2016 which showed: 1. Mild to moderate edema within the left calf. No evidence for acute DVT from left groin to popliteal fossa. Slightly limited evaluation of calf veins due to edema. 2. Left inguinal enlarged lymph nodes up to 6.2 cm, findings could be secondary to reactive node, infection, inflammation, or lymphoproliferative disease.  3. She had a repeat Doppler on 05/24/2016 which showed: IMPRESSION: 1. No evidence of lower extremity deep vein thrombosis, left. 2. Left inguinal lymphadenopathy. Differential diagnosis: Reactive adenopathy, lymphoma, metastatic disease.  4. CT abdomen on 06/14/2016 showed:  IMPRESSION: Lymphadenopathy within the left inguinal region and the left iliac chain. This is highly suspicious for underlying neoplasm. Biopsy is recommended for further evaluation.  5. Patient was seen by Dr. Jamal Collin from surgery and underwent excisional biopsy of the leftinguinal lymph node under ultrasound on 07/04/2016. Final pathology showed DIAGNOSIS:  A-B. LYMPH NODE, LEFT INGUINAL; EXCISION:  - FOLLICULAR LYMPHOMA, GRADE 1-2,  PREDOMINANTLY FOLLICULAR.   6. Patient has multiple sclerosis and has h/o depression and anxiety. She was given clonopin this morning and currently patient is drowsy but able to be aroused. She drifts off to sleep in between conversation. She is here with her friend who helps her out. Patient lives with her husband at baseline and uses a walker to ambulate. Reports pain and discomfort in her left groin. Denies fevers, chills, night sweats or weight loss  7. PET/CT scan from 07/16/16 showed: IMPRESSION: 1. Pathologic and hypermetabolic lower retroperitoneal, pelvic, and inguinal adenopathy compatible with Deauville 5 malignancy. 2. Mildly prominent right lower paratracheal lymph node, Deauville 4. 3. Accentuated activity in the right lateral pterygoid muscle without CT correlate, probably incidental, merits attention on any follow up. 4. Other imaging findings of potential clinical significance: Airway thickening is present, suggesting bronchitis or reactive airways disease. Trace left pleural effusion. Coronary and aortoiliac atherosclerosis. Prominent stool throughout the colon favors constipation. Late phase healing fractures of left lower anterior ribs.  8. Bone marrow biopsy from 07/18/2016 did not show any evidence of lymphoma. HIV and hepatitis B and hepatitis C testing was negative  9. Patient received 5 doses of rituxan between April-July 2018. Also received RT to her inguinal region  Interval history-patient is doing relatively well given her comorbidities.  She was admitted to the hospital in February 2021 for symptoms of generalized weakness.  She was treated for dehydration and left lower extremity cellulitis as well as UTI.  Appetite and weight has remained stable.  ECOG PS- 2-3 Pain scale- 0   Review of systems- Review of Systems  Constitutional: Positive for malaise/fatigue. Negative for chills, fever and weight loss.  HENT: Negative for congestion, ear discharge and  nosebleeds.   Eyes:  Negative for blurred vision.  Respiratory: Negative for cough, hemoptysis, sputum production, shortness of breath and wheezing.   Cardiovascular: Positive for leg swelling. Negative for chest pain, palpitations, orthopnea and claudication.  Gastrointestinal: Negative for abdominal pain, blood in stool, constipation, diarrhea, heartburn, melena, nausea and vomiting.  Genitourinary: Negative for dysuria, flank pain, frequency, hematuria and urgency.  Musculoskeletal: Negative for back pain, joint pain and myalgias.  Skin: Negative for rash.  Neurological: Negative for dizziness, tingling, focal weakness, seizures, weakness and headaches.  Endo/Heme/Allergies: Does not bruise/bleed easily.  Psychiatric/Behavioral: Negative for depression and suicidal ideas. The patient does not have insomnia.       No Known Allergies   Past Medical History:  Diagnosis Date  . Abdominal aortic atherosclerosis ([place]) 11/11/2016  . ADHD   . Anxiety   . COPD (chronic obstructive pulmonary disease) ([place])   . Depression    major depressive  . Dyspnea    doe  . Edema    left leg  . Follicular lymphoma ([place])    B Cell  . Follicular lymphoma grade II ([place])   . Hypertension   . Hypotension    idiopathic  . Kyphoscoliosis and scoliosis 11/26/2011  . Morbid obesity ([place]) 01/05/2016  . Multiple sclerosis ([place])   . Multiple sclerosis ([place])    1980's  . Neuromuscular disorder ([place])   . Obstructive and reflux uropathy    foley  . Pain    atypical facial  . Peripheral vascular disease of lower extremity with ulceration ([place]) 11/08/2015  . Skin ulcer ([place]) 11/08/2015  . Weakness    generalized. has MS     Past Surgical History:  Procedure Laterality Date  . BACK SURGERY N/A 2002  . CYST EXCISION     lower back  . INGUINAL LYMPH NODE BIOPSY Left 07/04/2016   Procedure: INGUINAL LYMPH NODE BIOPSY;  Surgeon: Christene Lye, MD;  Location: [facility] ORS;  Service: General;  Laterality:  Left;  . PORTACATH PLACEMENT N/A 07/22/2016   Procedure: INSERTION PORT-A-CATH;  Surgeon: Christene Lye, MD;  Location: [facility] ORS;  Service: General;  Laterality: N/A;  . TONSILLECTOMY AND ADENOIDECTOMY    . TUBAL LIGATION      Social History   Socioeconomic History  . Marital status: Married    Spouse name: Not on file  . Number of children: 3  . Years of education: Not on file  . Highest education level: Not on file  Occupational History  . Occupation: disabled  Tobacco Use  . Smoking status: Former Smoker    Packs/day: 1.00    Years: 20.00    Pack years: 20.00    Types: Cigarettes    Start date: 04/30/1995    Quit date: 02/03/2016    Years since quitting: 3.6  . Smokeless tobacco: Never Used  Substance and Sexual Activity  . Alcohol use: No    Alcohol/week: 0.0 standard drinks  . Drug use: Yes    Types: Marijuana    Comment: smokes THC occasionally per pt   . Sexual activity: Not Currently    Birth control/protection: None  Other Topics Concern  . Not on file  Social History Narrative   She is married.    Social Determinants of Health   Financial Resource Strain:   . Difficulty of Paying Living Expenses:   Food Insecurity:   . Worried About Charity fundraiser in the Last Year:   . Arboriculturist in the Last Year:   [employer]  Needs:   . Lack of Transportation (Medical):   Marland Kitchen Lack of Transportation (Non-Medical):   Physical Activity:   . Days of Exercise per Week:   . Minutes of Exercise per Session:   Stress:   . Feeling of Stress :   Social Connections:   . Frequency of Communication with Friends and Family:   . Frequency of Social Gatherings with Friends and Family:   . Attends Religious Services:   . Active Member of Clubs or Organizations:   . Attends Archivist Meetings:   Marland Kitchen Marital Status:   Intimate Partner Violence:   . Fear of Current or Ex-Partner:   . Emotionally Abused:   Marland Kitchen Physically Abused:   . Sexually Abused:      Family History  Problem Relation Age of Onset  . COPD Mother   . Diabetes Mother   . Heart failure Mother   . Alcohol abuse Father   . Kidney disease Father   . Kidney failure Father   . Arthritis Sister   . CAD Maternal Grandmother   . Stroke Maternal Grandfather   . Arthritis Sister   . Mental illness Sister   . Arthritis Brother      Current Outpatient Medications:  .  acetaminophen (TYLENOL) 325 MG tablet, Take 2 tablets (650 mg total) by mouth every 6 (six) hours as needed for mild pain (or Fever >/= 101)., Disp:  , Rfl:  .  albuterol (VENTOLIN HFA) 108 (90 Base) MCG/ACT inhaler, Inhale 1 puff into the lungs every 4 (four) hours as needed for shortness of breath., Disp: , Rfl:  .  atorvastatin (LIPITOR) 10 MG tablet, Take 10 mg by mouth daily. , Disp: , Rfl:  .  baclofen (LIORESAL) 10 MG tablet, Take 10 mg by mouth 3 (three) times daily., Disp: , Rfl:  .  budesonide-formoterol (SYMBICORT) 160-4.5 MCG/ACT inhaler, Inhale 2 puffs into the lungs 2 (two) times daily., Disp: , Rfl:  .  buPROPion (WELLBUTRIN XL) 300 MG 24 hr tablet, Take 300 mg by mouth daily. , Disp: , Rfl:  .  clonazePAM (KLONOPIN) 1 MG tablet, Take 0.5 tablets (0.5 mg total) by mouth at bedtime. (Patient taking differently: Take 1 mg by mouth 2 (two) times daily as needed. ), Disp: 2 tablet, Rfl: 0 .  docusate sodium (COLACE) 100 MG capsule, Take 1 capsule (100 mg total) by mouth 2 (two) times daily. (Patient taking differently: Take 100 mg by mouth 2 (two) times daily as needed. ), Disp: 10 capsule, Rfl: 0 .  DULoxetine (CYMBALTA) 60 MG capsule, Take 1 capsule (60 mg total) by mouth every morning., Disp: 30 capsule, Rfl: 3 .  gabapentin (NEURONTIN) 600 MG tablet, Take 1 tablet (600 mg total) by mouth 3 (three) times daily., Disp: , Rfl:  .  hydrocerin (EUCERIN) CREA, Apply 1 application topically 2 (two) times daily., Disp: 30 g, Rfl: 0 .  interferon beta-1a (AVONEX) 30 MCG/0.5ML PSKT injection, Inject 30 mcg  into the muscle every Monday., Disp: , Rfl:  .  magnesium oxide (MAG-OX) 400 (241.3 Mg) MG tablet, Take 1 tablet (400 mg total) by mouth daily., Disp: 30 tablet, Rfl: 0 .  Multiple Vitamin (MULTIVITAMIN WITH MINERALS) TABS tablet, Take 1 tablet by mouth every evening. , Disp: , Rfl:  .  MYRBETRIQ 50 MG TB24 tablet, TAKE ONE TABLET BY MOUTH ONCE DAILY (Patient taking differently: Take 50 mg by mouth daily. TAKE ONE TABLET BY MOUTH ONCE DAILY), Disp: 30 tablet, Rfl: 0 .  polyethylene glycol (MIRALAX / GLYCOLAX) 17 g packet, Take 17 g by mouth daily as needed for mild constipation., Disp: 14 each, Rfl: 0 .  QUEtiapine Fumarate (SEROQUEL XR) 150 MG 24 hr tablet, Take 150 mg by mouth at bedtime. , Disp: , Rfl: 4 .  Teriflunomide (AUBAGIO) 14 MG TABS, Take 14 mg by mouth daily. , Disp: , Rfl:  .  tiZANidine (ZANAFLEX) 4 MG tablet, Take 4 mg by mouth 3 (three) times daily., Disp: , Rfl:  .  vitamin C (ASCORBIC ACID) 500 MG tablet, Take 500 mg by mouth every evening. , Disp: , Rfl:  No current facility-administered medications for this visit.  Facility-Administered Medications Ordered in Other Visits:  .  heparin lock flush 100 unit/mL, 500 Units, Intracatheter, Once PRN, Sindy Guadeloupe, MD  Physical exam:  Vitals:   09/06/19 1409  BP: (!) 121/51  Pulse: 78  Resp: 18  Temp: (!) 96.5 F (35.8 C)  TempSrc: Tympanic  SpO2: 97%   Physical Exam Constitutional:      General: She is not in acute distress. HENT:     Head: Normocephalic and atraumatic.  Eyes:     Pupils: Pupils are equal, round, and reactive to light.  Cardiovascular:     Rate and Rhythm: Normal rate and regular rhythm.     Heart sounds: Normal heart sounds.  Pulmonary:     Effort: Pulmonary effort is normal.     Breath sounds: Normal breath sounds.  Abdominal:     General: Bowel sounds are normal.     Palpations: Abdomen is soft.     Comments: Chronic Foley draining clear urine  Musculoskeletal:     Cervical back: Normal  range of motion.     Comments: Bilateral lower extremity swelling +1.  There is superficial erythema noted over the left lower extremity which is chronic.  No significant cellulitis noted  Lymphadenopathy:     Comments: Lymph node exam is limited because of obesity and body habitus.  No significant palpable axillary or supraclavicular adenopathy.  Skin:    General: Skin is warm and dry.  Neurological:     Mental Status: She is alert and oriented to person, place, and time.      CMP Latest Ref Rng & Units 09/06/2019  Glucose 70 - 99 mg/dL 128(H)  BUN 8 - 23 mg/dL 22  Creatinine 0.44 - 1.00 mg/dL 1.21(H)  Sodium 135 - 145 mmol/L 137  Potassium 3.5 - 5.1 mmol/L 4.2  Chloride 98 - 111 mmol/L 100  CO2 22 - 32 mmol/L 27  Calcium 8.9 - 10.3 mg/dL 8.8(L)  Total Protein 6.5 - 8.1 g/dL 7.0  Total Bilirubin 0.3 - 1.2 mg/dL 0.5  Alkaline Phos 38 - 126 U/L 65  AST 15 - 41 U/L 20  ALT 0 - 44 U/L 17   CBC Latest Ref Rng & Units 09/06/2019  WBC 4.0 - 10.5 K/uL 6.3  Hemoglobin 12.0 - 15.0 g/dL 11.6(L)  Hematocrit 36.0 - 46.0 % 36.0  Platelets 150 - 400 K/uL 219    Assessment and plan- Patient is a 70 y.o. female with history of follicular lymphoma s/p Rituxan and radiation treatment in 2018 here for routine follow-up  Clinically patient does not have any significant B symptoms.She also underwent CT abdomen pelvis without contrast in September 2020 which did not show any evidence of significant adenopathy.  She does have a left ovarian cystic mass which has been evaluated by GYN oncology in the past But no  intervention was pursued at that time because of patient comorbidities.  I will plan to get yearly scans at this time and see her back in 6 months with CBC with differential CMP and LDH and CT scans chest abdomen pelvis prior   Visit Diagnosis 1. Follicular lymphoma of intra-abdominal lymph nodes, unspecified follicular lymphoma type ([place])      Dr. Randa Evens, MD, MPH [facility] [phone] 09/09/2019 8:43 AM

## 2019-10-17 ENCOUNTER — Other Ambulatory Visit: Payer: Self-pay

## 2019-10-17 ENCOUNTER — Emergency Department: Payer: Medicare Other

## 2019-10-17 ENCOUNTER — Inpatient Hospital Stay
Admission: EM | Admit: 2019-10-17 | Discharge: 2019-10-22 | DRG: 698 | Disposition: A | Discharge status: Home Health | Payer: Medicare Other | Attending: Internal Medicine | Admitting: Internal Medicine

## 2019-10-17 DIAGNOSIS — Z6841 Body Mass Index (BMI) 40.0 and over, adult: Secondary | ICD-10-CM

## 2019-10-17 DIAGNOSIS — Z818 Family history of other mental and behavioral disorders: Secondary | ICD-10-CM

## 2019-10-17 DIAGNOSIS — Y738 Miscellaneous gastroenterology and urology devices associated with adverse incidents, not elsewhere classified: Secondary | ICD-10-CM | POA: Diagnosis present

## 2019-10-17 DIAGNOSIS — N179 Acute kidney failure, unspecified: Secondary | ICD-10-CM

## 2019-10-17 DIAGNOSIS — R4182 Altered mental status, unspecified: Secondary | ICD-10-CM | POA: Diagnosis present

## 2019-10-17 DIAGNOSIS — G4733 Obstructive sleep apnea (adult) (pediatric): Secondary | ICD-10-CM | POA: Diagnosis present

## 2019-10-17 DIAGNOSIS — J441 Chronic obstructive pulmonary disease with (acute) exacerbation: Secondary | ICD-10-CM | POA: Diagnosis present

## 2019-10-17 DIAGNOSIS — I739 Peripheral vascular disease, unspecified: Secondary | ICD-10-CM | POA: Diagnosis present

## 2019-10-17 DIAGNOSIS — J449 Chronic obstructive pulmonary disease, unspecified: Secondary | ICD-10-CM | POA: Diagnosis present

## 2019-10-17 DIAGNOSIS — F418 Other specified anxiety disorders: Secondary | ICD-10-CM | POA: Diagnosis present

## 2019-10-17 DIAGNOSIS — A401 Sepsis due to streptococcus, group B: Secondary | ICD-10-CM | POA: Diagnosis present

## 2019-10-17 DIAGNOSIS — Z87891 Personal history of nicotine dependence: Secondary | ICD-10-CM

## 2019-10-17 DIAGNOSIS — R531 Weakness: Secondary | ICD-10-CM

## 2019-10-17 DIAGNOSIS — A419 Sepsis, unspecified organism: Secondary | ICD-10-CM

## 2019-10-17 DIAGNOSIS — I700 Atherosclerosis of aorta: Secondary | ICD-10-CM | POA: Diagnosis present

## 2019-10-17 DIAGNOSIS — N189 Chronic kidney disease, unspecified: Secondary | ICD-10-CM | POA: Diagnosis not present

## 2019-10-17 DIAGNOSIS — Z8572 Personal history of non-Hodgkin lymphomas: Secondary | ICD-10-CM

## 2019-10-17 DIAGNOSIS — E785 Hyperlipidemia, unspecified: Secondary | ICD-10-CM | POA: Diagnosis present

## 2019-10-17 DIAGNOSIS — N183 Chronic kidney disease, stage 3 unspecified: Secondary | ICD-10-CM | POA: Diagnosis present

## 2019-10-17 DIAGNOSIS — N1832 Chronic kidney disease, stage 3b: Secondary | ICD-10-CM | POA: Diagnosis present

## 2019-10-17 DIAGNOSIS — Z720 Tobacco use: Secondary | ICD-10-CM | POA: Diagnosis present

## 2019-10-17 DIAGNOSIS — F909 Attention-deficit hyperactivity disorder, unspecified type: Secondary | ICD-10-CM | POA: Diagnosis present

## 2019-10-17 DIAGNOSIS — R7881 Bacteremia: Secondary | ICD-10-CM | POA: Diagnosis not present

## 2019-10-17 DIAGNOSIS — T83518A Infection and inflammatory reaction due to other urinary catheter, initial encounter: Secondary | ICD-10-CM | POA: Diagnosis not present

## 2019-10-17 DIAGNOSIS — Z7951 Long term (current) use of inhaled steroids: Secondary | ICD-10-CM

## 2019-10-17 DIAGNOSIS — I10 Essential (primary) hypertension: Secondary | ICD-10-CM | POA: Diagnosis not present

## 2019-10-17 DIAGNOSIS — Z8261 Family history of arthritis: Secondary | ICD-10-CM

## 2019-10-17 DIAGNOSIS — Z823 Family history of stroke: Secondary | ICD-10-CM

## 2019-10-17 DIAGNOSIS — R6520 Severe sepsis without septic shock: Secondary | ICD-10-CM | POA: Diagnosis not present

## 2019-10-17 DIAGNOSIS — A408 Other streptococcal sepsis: Secondary | ICD-10-CM | POA: Diagnosis not present

## 2019-10-17 DIAGNOSIS — N390 Urinary tract infection, site not specified: Secondary | ICD-10-CM | POA: Diagnosis present

## 2019-10-17 DIAGNOSIS — Z811 Family history of alcohol abuse and dependence: Secondary | ICD-10-CM

## 2019-10-17 DIAGNOSIS — D696 Thrombocytopenia, unspecified: Secondary | ICD-10-CM | POA: Diagnosis present

## 2019-10-17 DIAGNOSIS — W5503XA Scratched by cat, initial encounter: Secondary | ICD-10-CM | POA: Diagnosis not present

## 2019-10-17 DIAGNOSIS — G35 Multiple sclerosis: Secondary | ICD-10-CM | POA: Diagnosis present

## 2019-10-17 DIAGNOSIS — F3341 Major depressive disorder, recurrent, in partial remission: Secondary | ICD-10-CM | POA: Diagnosis present

## 2019-10-17 DIAGNOSIS — Z20822 Contact with and (suspected) exposure to covid-19: Secondary | ICD-10-CM | POA: Diagnosis present

## 2019-10-17 DIAGNOSIS — E876 Hypokalemia: Secondary | ICD-10-CM | POA: Diagnosis not present

## 2019-10-17 DIAGNOSIS — D849 Immunodeficiency, unspecified: Secondary | ICD-10-CM | POA: Diagnosis present

## 2019-10-17 DIAGNOSIS — G9341 Metabolic encephalopathy: Secondary | ICD-10-CM | POA: Diagnosis present

## 2019-10-17 DIAGNOSIS — L03115 Cellulitis of right lower limb: Secondary | ICD-10-CM | POA: Diagnosis not present

## 2019-10-17 DIAGNOSIS — Z8249 Family history of ischemic heart disease and other diseases of the circulatory system: Secondary | ICD-10-CM

## 2019-10-17 DIAGNOSIS — Z981 Arthrodesis status: Secondary | ICD-10-CM

## 2019-10-17 DIAGNOSIS — G629 Polyneuropathy, unspecified: Secondary | ICD-10-CM | POA: Diagnosis present

## 2019-10-17 DIAGNOSIS — N319 Neuromuscular dysfunction of bladder, unspecified: Secondary | ICD-10-CM | POA: Diagnosis present

## 2019-10-17 DIAGNOSIS — I129 Hypertensive chronic kidney disease with stage 1 through stage 4 chronic kidney disease, or unspecified chronic kidney disease: Secondary | ICD-10-CM | POA: Diagnosis present

## 2019-10-17 DIAGNOSIS — L89151 Pressure ulcer of sacral region, stage 1: Secondary | ICD-10-CM | POA: Diagnosis present

## 2019-10-17 DIAGNOSIS — Z79899 Other long term (current) drug therapy: Secondary | ICD-10-CM

## 2019-10-17 DIAGNOSIS — Z833 Family history of diabetes mellitus: Secondary | ICD-10-CM

## 2019-10-17 DIAGNOSIS — Z841 Family history of disorders of kidney and ureter: Secondary | ICD-10-CM

## 2019-10-17 DIAGNOSIS — B951 Streptococcus, group B, as the cause of diseases classified elsewhere: Secondary | ICD-10-CM | POA: Diagnosis not present

## 2019-10-17 DIAGNOSIS — L03116 Cellulitis of left lower limb: Secondary | ICD-10-CM | POA: Diagnosis not present

## 2019-10-17 DIAGNOSIS — R6521 Severe sepsis with septic shock: Secondary | ICD-10-CM | POA: Diagnosis not present

## 2019-10-17 DIAGNOSIS — Z825 Family history of asthma and other chronic lower respiratory diseases: Secondary | ICD-10-CM

## 2019-10-17 DIAGNOSIS — Z87440 Personal history of urinary (tract) infections: Secondary | ICD-10-CM

## 2019-10-17 LAB — CBC WITH DIFFERENTIAL/PLATELET
Abs Immature Granulocytes: 0.04 10*3/uL (ref 0.00–0.07)
Basophils Absolute: 0.1 10*3/uL (ref 0.0–0.1)
Basophils Relative: 1 %
Eosinophils Absolute: 0.1 10*3/uL (ref 0.0–0.5)
Eosinophils Relative: 1 %
HCT: 38.3 % (ref 36.0–46.0)
Hemoglobin: 12.9 g/dL (ref 12.0–15.0)
Immature Granulocytes: 0 %
Lymphocytes Relative: 4 %
Lymphs Abs: 0.4 10*3/uL — ABNORMAL LOW (ref 0.7–4.0)
MCH: 28.4 pg (ref 26.0–34.0)
MCHC: 33.7 g/dL (ref 30.0–36.0)
MCV: 84.4 fL (ref 80.0–100.0)
Monocytes Absolute: 0.7 10*3/uL (ref 0.1–1.0)
Monocytes Relative: 7 %
Neutro Abs: 9.5 10*3/uL — ABNORMAL HIGH (ref 1.7–7.7)
Neutrophils Relative %: 87 %
Platelets: 136 10*3/uL — ABNORMAL LOW (ref 150–400)
RBC: 4.54 MIL/uL (ref 3.87–5.11)
RDW: 15.3 % (ref 11.5–15.5)
WBC: 10.8 10*3/uL — ABNORMAL HIGH (ref 4.0–10.5)
nRBC: 0 % (ref 0.0–0.2)

## 2019-10-17 LAB — URINALYSIS, COMPLETE (UACMP) WITH MICROSCOPIC
Bacteria, UA: NONE SEEN
Bilirubin Urine: NEGATIVE
Glucose, UA: NEGATIVE mg/dL
Hgb urine dipstick: NEGATIVE
Ketones, ur: NEGATIVE mg/dL
Nitrite: POSITIVE — AB
Protein, ur: NEGATIVE mg/dL
Specific Gravity, Urine: 1.01 (ref 1.005–1.030)
pH: 9 — ABNORMAL HIGH (ref 5.0–8.0)

## 2019-10-17 LAB — COMPREHENSIVE METABOLIC PANEL
ALT: 19 U/L (ref 0–44)
AST: 26 U/L (ref 15–41)
Albumin: 4.1 g/dL (ref 3.5–5.0)
Alkaline Phosphatase: 79 U/L (ref 38–126)
Anion gap: 10 (ref 5–15)
BUN: 18 mg/dL (ref 8–23)
CO2: 27 mmol/L (ref 22–32)
Calcium: 9.4 mg/dL (ref 8.9–10.3)
Chloride: 95 mmol/L — ABNORMAL LOW (ref 98–111)
Creatinine, Ser: 1.33 mg/dL — ABNORMAL HIGH (ref 0.44–1.00)
GFR calc Af Amer: 47 mL/min — ABNORMAL LOW (ref >60)
GFR calc non Af Amer: 41 mL/min — ABNORMAL LOW (ref >60)
Glucose, Bld: 148 mg/dL — ABNORMAL HIGH (ref 70–99)
Potassium: 4.6 mmol/L (ref 3.5–5.1)
Sodium: 132 mmol/L — ABNORMAL LOW (ref 135–145)
Total Bilirubin: 0.9 mg/dL (ref 0.3–1.2)
Total Protein: 8 g/dL (ref 6.5–8.1)

## 2019-10-17 LAB — SARS CORONAVIRUS 2 BY RT PCR (HOSPITAL ORDER, PERFORMED IN ~~LOC~~ HOSPITAL LAB): SARS Coronavirus 2: NEGATIVE

## 2019-10-17 LAB — LACTIC ACID, PLASMA
Lactic Acid, Venous: 2.4 mmol/L (ref 0.5–1.9)
Lactic Acid, Venous: 2 mmol/L (ref 0.5–1.9)

## 2019-10-17 MED ORDER — SODIUM CHLORIDE 0.9 % IV SOLN
2.0000 g | Freq: Once | INTRAVENOUS | Status: AC
  Administered 2019-10-17: 2 g via INTRAVENOUS
  Filled 2019-10-17: qty 2

## 2019-10-17 MED ORDER — ONDANSETRON HCL 4 MG/2ML IJ SOLN: 4.0000 mg | Freq: Four times a day (QID) | INTRAMUSCULAR | Status: DC | PRN

## 2019-10-17 MED ORDER — LACTATED RINGERS IV SOLN: INTRAVENOUS | Status: DC

## 2019-10-17 MED ORDER — LACTATED RINGERS IV BOLUS
1000.0000 mL | Freq: Once | INTRAVENOUS | Status: AC
  Administered 2019-10-17: 1000 mL via INTRAVENOUS

## 2019-10-17 MED ORDER — ONDANSETRON HCL 4 MG PO TABS: 4.0000 mg | ORAL_TABLET | Freq: Four times a day (QID) | ORAL | Status: DC | PRN

## 2019-10-17 MED ORDER — LACTATED RINGERS IV BOLUS
1000.0000 mL | Freq: Once | INTRAVENOUS | Status: AC
  Administered 2019-10-18: 1000 mL via INTRAVENOUS

## 2019-10-17 MED ORDER — ENOXAPARIN SODIUM 40 MG/0.4ML ~~LOC~~ SOLN
40.0000 mg | SUBCUTANEOUS | Status: DC
  Administered 2019-10-17 – 2019-10-18 (×2): 40 mg via SUBCUTANEOUS
  Filled 2019-10-17 (×2): qty 0.4

## 2019-10-17 MED ORDER — ACETAMINOPHEN 325 MG PO TABS
650.0000 mg | ORAL_TABLET | Freq: Four times a day (QID) | ORAL | Status: DC | PRN
  Administered 2019-10-17 – 2019-10-21 (×5): 650 mg via ORAL
  Filled 2019-10-17 (×6): qty 2

## 2019-10-17 MED ORDER — SODIUM CHLORIDE 0.9 % IV SOLN
1.0000 g | INTRAVENOUS | Status: DC
  Administered 2019-10-17: 1 g via INTRAVENOUS
  Filled 2019-10-17: qty 10

## 2019-10-17 MED ORDER — ACETAMINOPHEN 650 MG RE SUPP: 650.0000 mg | Freq: Four times a day (QID) | RECTAL | Status: DC | PRN

## 2019-10-17 NOTE — ED Notes (Signed)
Pt taken for CT 

## 2019-10-17 NOTE — ED Triage Notes (Signed)
Pt arrives via EMS from home after having increased weakness the past few days- pt had a temp of 102.9 per EMS- pt has a chronic foley and frequently gets UTIs- per EMS pt noticed sediment in the foley bag- pt was given 650 tylenol by EMS

## 2019-10-17 NOTE — Consult Note (Signed)
CODE SEPSIS - PHARMACY COMMUNICATION  **Broad Spectrum Antibiotics should be administered within 1 hour of Sepsis diagnosis**  Time Code Sepsis Called/Page Received: 1546  Antibiotics Ordered: Cefepime  Time of 1st antibiotic administration: 1654  Additional action taken by pharmacy: Called to follow-up @ 1630 - still working on IV access  If necessary, Name of Provider/Nurse Contacted: [place] ,PharmD Clinical Pharmacist  10/17/2019  3:56 PM

## 2019-10-17 NOTE — Consult Note (Signed)
PHARMACY -  BRIEF ANTIBIOTIC NOTE   Pharmacy has received consult(s) for Cefepime from an ED provider.  The patient's profile has been reviewed for ht/wt/allergies/indication/available labs.    One time order(s) placed for Cefepime 2g x 1 by ED provider  Further antibiotics/pharmacy consults should be ordered by admitting physician if indicated.                       Thank you,  Lu Duffel, PharmD, BCPS Clinical Pharmacist 10/17/2019 3:58 PM

## 2019-10-17 NOTE — H&P (Signed)
History and Physical   [name] MGQ:[id number] DOB: 18-Feb-1951 DOA: 10/17/2019  Referring MD/NP/PA: Dr. Ellender Hose  PCP: Kirk Ruths, MD   Patient coming from: Home  Chief Complaint: Fever and chills  HPI: [name] is a 69 y.o. female with medical history significant of recurrent UTIs, COPD, depression, AAA, lymphoma, anxiety disorder, peripheral vascular disease, hypertension, multiple sclerosis who has had recurrent UTIs and hospitalizations.  Patient came to the ER today with fever chills and generalized weakness.  She has chronic indwelling Foley catheter.  She was also confused.  She has noticed progressively foul-smelling urine with sediments.  This has been going on gradually over the last 2 to 3 weeks.  She was given antibiotics at home which is Keflex.  Despite that she started having fever chills nausea and poor appetite since yesterday.  She also has been more confused forgetting things.  Patient was brought in by EMS.  On arrival she was found to be confused but able to communicate.  Further evaluation showed evidence of UTI and sepsis.  She is being admitted to the hospital for further evaluation and treatment..  ED Course: Temperature 103.3 blood pressure 160s over 77 pulse 84 respirate 18 oxygen sat 95% room air.  Sodium 132 glucose 148 creatinine 1.33.  Lactic acid 2.4 white count 10.8 and platelets 136.  CT renal stone showed no acute findings.  Urinalysis showed turbid urine with large leukocyte positive nitrite.  WBC and bacteria not many seen.  Patient has all the symptoms of sepsis with evidence of possible UTI so she is being admitted with sepsis due to UTI.  Review of Systems: As per HPI otherwise 10 point review of systems negative.    Past Medical History:  Diagnosis Date  . Abdominal aortic atherosclerosis ([place]) 11/11/2016  . ADHD   . Anxiety   . COPD (chronic obstructive pulmonary disease) ([place])   . Depression    major depressive  . Dyspnea    doe   . Edema    left leg  . Follicular lymphoma ([place])    B Cell  . Follicular lymphoma grade II ([place])   . Hypertension   . Hypotension    idiopathic  . Kyphoscoliosis and scoliosis 11/26/2011  . Morbid obesity ([place]) 01/05/2016  . Multiple sclerosis ([place])   . Multiple sclerosis ([place])    1980's  . Neuromuscular disorder ([place])   . Obstructive and reflux uropathy    foley  . Pain    atypical facial  . Peripheral vascular disease of lower extremity with ulceration ([place]) 11/08/2015  . Skin ulcer ([place]) 11/08/2015  . Weakness    generalized. has MS    Past Surgical History:  Procedure Laterality Date  . BACK SURGERY N/A 2002  . CYST EXCISION     lower back  . INGUINAL LYMPH NODE BIOPSY Left 07/04/2016   Procedure: INGUINAL LYMPH NODE BIOPSY;  Surgeon: Christene Lye, MD;  Location: [facility] ORS;  Service: General;  Laterality: Left;  . PORTACATH PLACEMENT N/A 07/22/2016   Procedure: INSERTION PORT-A-CATH;  Surgeon: Christene Lye, MD;  Location: [facility] ORS;  Service: General;  Laterality: N/A;  . TONSILLECTOMY AND ADENOIDECTOMY    . TUBAL LIGATION       reports that she quit smoking about 3 years ago. Her smoking use included cigarettes. She started smoking about 24 years ago. She has a 20.00 pack-year smoking history. She has never used smokeless tobacco. She reports current drug use. Drug: Marijuana.  She reports that she does not drink alcohol.  No Known Allergies  Family History  Problem Relation Age of Onset  . COPD Mother   . Diabetes Mother   . Heart failure Mother   . Alcohol abuse Father   . Kidney disease Father   . Kidney failure Father   . Arthritis Sister   . CAD Maternal Grandmother   . Stroke Maternal Grandfather   . Arthritis Sister   . Mental illness Sister   . Arthritis Brother      Prior to Admission medications   Medication Sig Start Date End Date Taking? Authorizing Provider  acetaminophen (TYLENOL) 325 MG tablet Take 2 tablets (650 mg total) by  mouth every 6 (six) hours as needed for mild pain (or Fever >/= 101). 01/08/19   Gouru, Illene Silver, MD  albuterol (VENTOLIN HFA) 108 (90 Base) MCG/ACT inhaler Inhale 1 puff into the lungs every 4 (four) hours as needed for shortness of breath. 07/07/18   [provider]  atorvastatin (LIPITOR) 10 MG tablet Take 10 mg by mouth daily.  01/14/19   [provider]  baclofen (LIORESAL) 10 MG tablet Take 10 mg by mouth 3 (three) times daily. 04/10/19   [provider]  budesonide-formoterol (SYMBICORT) 160-4.5 MCG/ACT inhaler Inhale 2 puffs into the lungs 2 (two) times daily. 07/08/18   [provider]  buPROPion (WELLBUTRIN XL) 300 MG 24 hr tablet Take 300 mg by mouth daily.  01/21/19   [provider]  clonazePAM (KLONOPIN) 1 MG tablet Take 0.5 tablets (0.5 mg total) by mouth at bedtime. Patient taking differently: Take 1 mg by mouth 2 (two) times daily as needed.  04/22/19   Lavina Hamman, MD  docusate sodium (COLACE) 100 MG capsule Take 1 capsule (100 mg total) by mouth 2 (two) times daily. Patient taking differently: Take 100 mg by mouth 2 (two) times daily as needed.  01/08/19   Gouru, Illene Silver, MD  DULoxetine (CYMBALTA) 60 MG capsule Take 1 capsule (60 mg total) by mouth every morning. 10/14/16   Vaughan Basta, MD  gabapentin (NEURONTIN) 600 MG tablet Take 1 tablet (600 mg total) by mouth 3 (three) times daily. 02/09/17   Bettey Costa, MD  hydrocerin (EUCERIN) CREA Apply 1 application topically 2 (two) times daily. 06/22/19   Nolberto Hanlon, MD  interferon beta-1a (AVONEX) 30 MCG/0.5ML PSKT injection Inject 30 mcg into the muscle every Monday.    [provider]  magnesium oxide (MAG-OX) 400 (241.3 Mg) MG tablet Take 1 tablet (400 mg total) by mouth daily. 06/22/19   Nolberto Hanlon, MD  Multiple Vitamin (MULTIVITAMIN WITH MINERALS) TABS tablet Take 1 tablet by mouth every evening.     [provider]  MYRBETRIQ 50 MG TB24 tablet TAKE ONE TABLET BY  MOUTH ONCE DAILY Patient taking differently: Take 50 mg by mouth daily. TAKE ONE TABLET BY MOUTH ONCE DAILY 03/08/19   Stoioff, Ronda Fairly, MD  polyethylene glycol (MIRALAX / GLYCOLAX) 17 g packet Take 17 g by mouth daily as needed for mild constipation. 04/22/19   Lavina Hamman, MD  QUEtiapine Fumarate (SEROQUEL XR) 150 MG 24 hr tablet Take 150 mg by mouth at bedtime.     [provider]  Teriflunomide (AUBAGIO) 14 MG TABS Take 14 mg by mouth daily.  12/15/18   [provider]  tiZANidine (ZANAFLEX) 4 MG tablet Take 4 mg by mouth 3 (three) times daily. 10/06/18   [provider]  vitamin C (ASCORBIC ACID) 500  MG tablet Take 500 mg by mouth every evening.     [provider]    Physical Exam: Vitals:   10/17/19 1528 10/17/19 1529  BP: (!) 142/64   Pulse: 83   Resp: 18   Temp: (!) 103.3 F (39.6 C)   TempSrc: Oral   SpO2: 95%   Weight:  133 kg  Height:  5\' 6"  (1.676 m)      Constitutional: Confused, still communicative Vitals:   10/17/19 1528 10/17/19 1529  BP: (!) 142/64   Pulse: 83   Resp: 18   Temp: (!) 103.3 F (39.6 C)   TempSrc: Oral   SpO2: 95%   Weight:  133 kg  Height:  5\' 6"  (1.676 m)   Eyes: PERRL, lids and conjunctivae normal ENMT: Mucous membranes are dry. Posterior pharynx clear of any exudate or lesions.Normal dentition.  Neck: normal, supple, no masses, no thyromegaly Respiratory: clear to auscultation bilaterally, no wheezing, no crackles. Normal respiratory effort. No accessory muscle use.  Cardiovascular: Sinus tachycardia, no murmurs / rubs / gallops. No extremity edema. 2+ pedal pulses. No carotid bruits.  Abdomen: no tenderness, no masses palpated. No hepatosplenomegaly. Bowel sounds positive.  Chronic indwelling catheter Musculoskeletal: no clubbing / cyanosis. No joint deformity upper and lower extremities. Good ROM, no contractures.  Flaccid muscle tone.  Skin: no rashes, lesions, ulcers. No induration Neurologic: CN  2-12 grossly intact. Sensation intact, DTR normal. Strength 5/5 in all 4.  Psychiatric: Confused   Labs on Admission: I have personally reviewed following labs and imaging studies  CBC: Recent Labs  Lab 10/17/19 1553  WBC 10.8*  NEUTROABS 9.5*  HGB 12.9  HCT 38.3  MCV 84.4  PLT 751*   Basic Metabolic Panel: Recent Labs  Lab 10/17/19 1636  NA 132*  K 4.6  CL 95*  CO2 27  GLUCOSE 148*  BUN 18  CREATININE 1.33*  CALCIUM 9.4   GFR: Estimated Creatinine Clearance: 56.8 mL/min (A) (by C-G formula based on SCr of 1.33 mg/dL (H)). Liver Function Tests: Recent Labs  Lab 10/17/19 1636  AST 26  ALT 19  ALKPHOS 79  BILITOT 0.9  PROT 8.0  ALBUMIN 4.1   No results for input(s): LIPASE, AMYLASE in the last 168 hours. No results for input(s): AMMONIA in the last 168 hours. Coagulation Profile: No results for input(s): INR, PROTIME in the last 168 hours. Cardiac Enzymes: No results for input(s): CKTOTAL, CKMB, CKMBINDEX, TROPONINI in the last 168 hours. BNP (last 3 results) No results for input(s): PROBNP in the last 8760 hours. HbA1C: No results for input(s): HGBA1C in the last 72 hours. CBG: No results for input(s): GLUCAP in the last 168 hours. Lipid Profile: No results for input(s): CHOL, HDL, LDLCALC, TRIG, CHOLHDL, LDLDIRECT in the last 72 hours. Thyroid Function Tests: No results for input(s): TSH, T4TOTAL, FREET4, T3FREE, THYROIDAB in the last 72 hours. Anemia Panel: No results for input(s): VITAMINB12, FOLATE, FERRITIN, TIBC, IRON, RETICCTPCT in the last 72 hours. Urine analysis:    Component Value Date/Time   COLORURINE YELLOW (A) 10/17/2019 1553   APPEARANCEUR TURBID (A) 10/17/2019 1553   APPEARANCEUR Hazy 08/02/2013 0020   LABSPEC 1.010 10/17/2019 1553   LABSPEC 1.012 08/02/2013 0020   PHURINE 9.0 (H) 10/17/2019 1553   GLUCOSEU NEGATIVE 10/17/2019 1553   GLUCOSEU Negative 08/02/2013 0020   HGBUR NEGATIVE 10/17/2019 1553   BILIRUBINUR NEGATIVE  10/17/2019 1553   BILIRUBINUR Negative 08/02/2013 0020   KETONESUR NEGATIVE 10/17/2019 1553   PROTEINUR NEGATIVE  10/17/2019 1553   NITRITE POSITIVE (A) 10/17/2019 1553   LEUKOCYTESUR LARGE (A) 10/17/2019 1553   LEUKOCYTESUR 2+ 08/02/2013 0020   Sepsis Labs: @LABRCNTIP (procalcitonin:4,lacticidven:4) )No results found for this or any previous visit (from the past 240 hour(s)).   Radiological Exams on Admission: No results found.  Assessment/Plan Principal Problem:   Sepsis secondary to UTI [facility]) Active Problems:   COPD with bronchial hyperresponsiveness (HCC)   Major depressive disorder, recurrent, in partial remission (HCC)   Hypertension goal BP (blood pressure) < 140/90   Current tobacco use   Obstructive sleep apnea of adult   CKD (chronic kidney disease) stage 3, GFR 30-59 ml/min   Altered mental status   UTI (urinary tract infection)     #1 sepsis due to UTI: Patient will be admitted to progressive care unit.  IV antibiotics IV fluids with sepsis protocol.  Blood cultures and urine cultures obtained.  Antibiotics will be adjusted accordingly.  No evidence of renal abscess but appears to be pyelonephritis based on the presentation.  #2 COPD: No acute exacerbation.  Continue close monitoring.  #3 hypertension: Blood pressure was controlled.  Continue home regimen and monitor especially in the setting of possible sepsis.  #4 altered mental status: Secondary to sepsis and UTI.  Mental status seems to be slowly improving.  #5 anxiety with depression: Resume on continue home regimen.  #6 chronic kidney disease stage III: Continue to monitor renal function.  #7 tobacco abuse: Cessation counseling when patient awake.  Consider nicotine patch.  #8 obstructive sleep apnea: Evaluate for possible CPAP if patient uses at home.  She is a poor historian at this point so unable to tell.  #9 multiple sclerosis and other neurogenic disorders: Chronic and stable.  Has chronic  indwelling catheter.  Confirm and resume home regimen   DVT prophylaxis: Lovenox Code Status: Full code Family Communication: No family at bedside Disposition Plan: To be determined Consults called: None Admission status: Inpatient to progressive care  Severity of Illness: The appropriate patient status for this patient is INPATIENT. Inpatient status is judged to be reasonable and necessary in order to provide the required intensity of service to ensure the patient's safety. The patient's presenting symptoms, physical exam findings, and initial radiographic and laboratory data in the context of their chronic comorbidities is felt to place them at high risk for further clinical deterioration. Furthermore, it is not anticipated that the patient will be medically stable for discharge from the hospital within 2 midnights of admission. The following factors support the patient status of inpatient.   " The patient's presenting symptoms include fever altered mental status. " The worrisome physical exam findings include confusion. " The initial radiographic and laboratory data are worrisome because of evidence of sepsis. " The chronic co-morbidities include chronic indwelling catheter.   * I certify that at the point of admission it is my clinical judgment that the patient will require inpatient hospital care spanning beyond 2 midnights from the point of admission due to high intensity of service, high risk for further deterioration and high frequency of surveillance required.Barbette Merino MD [facility] Pager [phone]  If 7PM-7AM, please contact night-coverage [email or web address] Password [facility]  10/17/2019, 6:32 PM

## 2019-10-17 NOTE — ED Provider Notes (Signed)
[facility] EMERGENCY DEPARTMENT Provider Note   CSN: [id number] Arrival date & time: 10/17/19  1508     History Chief Complaint  Patient presents with  . Weakness    [name] is a 69 y.o. female.  HPI 69 year old female with extensive past medical history as below including multiple sclerosis, history of recurrent UTIs with indwelling Foley, here with weakness, fever, confusion.  Patient arrives with chief complaint of generalized confusion.  Per report, she has had increasing urinary sediment and foul-smelling urine for the last 2 to 3 weeks.  She states that she was prescribed an antibiotic but has not necessarily been taking it as prescribed.  Over the last day, she has had fever, chills, nausea, and poor appetite.  She said increasing confusion.  History is somewhat limited due to this confusion, but this was confirmed with EMS.  On EMS arrival, the patient was febrile and ill-appearing.  She has been given Tylenol.  No other complaints.  She denies any chest pain or shortness of breath.    Past Medical History:  Diagnosis Date  . Abdominal aortic atherosclerosis ([place]) 11/11/2016  . ADHD   . Anxiety   . COPD (chronic obstructive pulmonary disease) ([place])   . Depression    major depressive  . Dyspnea    doe  . Edema    left leg  . Follicular lymphoma ([place])    B Cell  . Follicular lymphoma grade II ([place])   . Hypertension   . Hypotension    idiopathic  . Kyphoscoliosis and scoliosis 11/26/2011  . Morbid obesity ([place]) 01/05/2016  . Multiple sclerosis ([place])   . Multiple sclerosis ([place])    1980's  . Neuromuscular disorder ([place])   . Obstructive and reflux uropathy    foley  . Pain    atypical facial  . Peripheral vascular disease of lower extremity with ulceration ([place]) 11/08/2015  . Skin ulcer ([place]) 11/08/2015  . Weakness    generalized. has MS    Patient Active Problem List   Diagnosis Date Noted  . Generalized weakness   . AKI (acute  kidney injury) ([place]) 04/20/2019  . Pressure injury of skin 02/03/2019  . Bilateral lower leg cellulitis 02/02/2019  . Ovarian mass, left 01/27/2019  . Sepsis ([place]) 01/05/2019  . UTI (urinary tract infection) 06/13/2018  . Altered mental status 06/11/2018  . Fall 05/13/2018  . Depression 05/13/2018  . Recurrent cellulitis of lower extremity 06/25/2017  . Medication monitoring encounter 06/05/2017  . Cellulitis of left lower leg 04/25/2017  . Adjustment disorder with mixed disturbance of emotions and conduct 04/23/2017  . Foot pain, bilateral 02/03/2017  . Tinea pedis 02/03/2017  . Left ovarian cyst 12/09/2016  . Abdominal aortic atherosclerosis ([place]) 11/11/2016  . Swelling of lower extremity 10/11/2016  . Obstructive sleep apnea 10/11/2016  . Follicular lymphoma of intra-abdominal lymph nodes ([place]) 08/06/2016  . Multiple falls 06/07/2016  . Inguinal adenopathy 05/29/2016  . Obesity, morbid ([place]) 01/05/2016  . Low HDL (under 40) 12/19/2015  . Cellulitis 12/18/2015  . Skin ulcer ([place]) 11/08/2015  . Peripheral vascular disease of lower extremity with ulceration ([place]) 11/08/2015  . CKD (chronic kidney disease) stage 3, GFR 30-59 ml/min 11/08/2015  . Constipation due to pain medication 01/31/2015  . Obstructive sleep apnea of adult 01/13/2015  . Pelvic muscle wasting 01/13/2015  . Incomplete bladder emptying 01/13/2015  . Headache, migraine 10/24/2014  . 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 Current tobacco use 10/24/2014  . Lumbar  radiculopathy, chronic 10/02/2013  . COPD with bronchial hyperresponsiveness ([place]) 10/02/2013  . Major depressive disorder, recurrent, in partial remission ([place]) 10/02/2013  . Hypertension goal BP (blood pressure) < 140/90 10/02/2013  . Multiple sclerosis ([place]) 10/02/2013  . Absence of bladder continence 09/25/2012  . Acontractile bladder 02/12/2012  . Narrowing of intervertebral disc space 11/26/2011  . Kyphoscoliosis and scoliosis 11/26/2011    Past  Surgical History:  Procedure Laterality Date  . BACK SURGERY N/A 2002  . CYST EXCISION     lower back  . INGUINAL LYMPH NODE BIOPSY Left 07/04/2016   Procedure: INGUINAL LYMPH NODE BIOPSY;  Surgeon: Christene Lye, MD;  Location: [facility] ORS;  Service: General;  Laterality: Left;  . PORTACATH PLACEMENT N/A 07/22/2016   Procedure: INSERTION PORT-A-CATH;  Surgeon: Christene Lye, MD;  Location: [facility] ORS;  Service: General;  Laterality: N/A;  . TONSILLECTOMY AND ADENOIDECTOMY    . TUBAL LIGATION       OB History    Gravida  3   Para      Term      Preterm      AB      Living  3     SAB      TAB      Ectopic      Multiple      Live Births           Obstetric Comments  Menstrual age: 32  Age 1st Pregnancy: 53        Family History  Problem Relation Age of Onset  . COPD Mother   . Diabetes Mother   . Heart failure Mother   . Alcohol abuse Father   . Kidney disease Father   . Kidney failure Father   . Arthritis Sister   . CAD Maternal Grandmother   . Stroke Maternal Grandfather   . Arthritis Sister   . Mental illness Sister   . Arthritis Brother     Social History   Tobacco Use  . Smoking status: Former Smoker    Packs/day: 1.00    Years: 20.00    Pack years: 20.00    Types: Cigarettes    Start date: 04/30/1995    Quit date: 02/03/2016    Years since quitting: 3.7  . Smokeless tobacco: Never Used  Vaping Use  . Vaping Use: Some days  Substance Use Topics  . Alcohol use: No    Alcohol/week: 0.0 standard drinks  . Drug use: Yes    Types: Marijuana    Comment: smokes THC occasionally per pt     Home Medications Prior to Admission medications   Medication Sig Start Date End Date Taking? Authorizing Provider  acetaminophen (TYLENOL) 325 MG tablet Take 2 tablets (650 mg total) by mouth every 6 (six) hours as needed for mild pain (or Fever >/= 101). 01/08/19   Gouru, Illene Silver, MD  albuterol (VENTOLIN HFA) 108 (90 Base) MCG/ACT inhaler Inhale  1 puff into the lungs every 4 (four) hours as needed for shortness of breath. 07/07/18   [provider]  atorvastatin (LIPITOR) 10 MG tablet Take 10 mg by mouth daily.  01/14/19   [provider]  baclofen (LIORESAL) 10 MG tablet Take 10 mg by mouth 3 (three) times daily. 04/10/19   [provider]  budesonide-formoterol (SYMBICORT) 160-4.5 MCG/ACT inhaler Inhale 2 puffs into the lungs 2 (two) times daily. 07/08/18   [provider]  buPROPion (WELLBUTRIN XL) 300 MG 24 hr tablet Take 300  mg by mouth daily.  01/21/19   [provider]  clonazePAM (KLONOPIN) 1 MG tablet Take 0.5 tablets (0.5 mg total) by mouth at bedtime. Patient taking differently: Take 1 mg by mouth 2 (two) times daily as needed.  04/22/19   Lavina Hamman, MD  docusate sodium (COLACE) 100 MG capsule Take 1 capsule (100 mg total) by mouth 2 (two) times daily. Patient taking differently: Take 100 mg by mouth 2 (two) times daily as needed.  01/08/19   Gouru, Illene Silver, MD  DULoxetine (CYMBALTA) 60 MG capsule Take 1 capsule (60 mg total) by mouth every morning. 10/14/16   Vaughan Basta, MD  gabapentin (NEURONTIN) 600 MG tablet Take 1 tablet (600 mg total) by mouth 3 (three) times daily. 02/09/17   Bettey Costa, MD  hydrocerin (EUCERIN) CREA Apply 1 application topically 2 (two) times daily. 06/22/19   Nolberto Hanlon, MD  interferon beta-1a (AVONEX) 30 MCG/0.5ML PSKT injection Inject 30 mcg into the muscle every Monday.    [provider]  magnesium oxide (MAG-OX) 400 (241.3 Mg) MG tablet Take 1 tablet (400 mg total) by mouth daily. 06/22/19   Nolberto Hanlon, MD  Multiple Vitamin (MULTIVITAMIN WITH MINERALS) TABS tablet Take 1 tablet by mouth every evening.     [provider]  MYRBETRIQ 50 MG TB24 tablet TAKE ONE TABLET BY MOUTH ONCE DAILY Patient taking differently: Take 50 mg by mouth daily. TAKE ONE TABLET BY MOUTH ONCE DAILY 03/08/19   Stoioff, Ronda Fairly, MD  polyethylene glycol  (MIRALAX / GLYCOLAX) 17 g packet Take 17 g by mouth daily as needed for mild constipation. 04/22/19   Lavina Hamman, MD  QUEtiapine Fumarate (SEROQUEL XR) 150 MG 24 hr tablet Take 150 mg by mouth at bedtime.     [provider]  Teriflunomide (AUBAGIO) 14 MG TABS Take 14 mg by mouth daily.  12/15/18   [provider]  tiZANidine (ZANAFLEX) 4 MG tablet Take 4 mg by mouth 3 (three) times daily. 10/06/18   [provider]  vitamin C (ASCORBIC ACID) 500 MG tablet Take 500 mg by mouth every evening.     [provider]    Allergies    Patient has no known allergies.  Review of Systems   Review of Systems  Unable to perform ROS: Mental status change  Constitutional: Positive for chills and fatigue. Negative for fever.  HENT: Negative for congestion and sore throat.   Eyes: Negative for visual disturbance.  Respiratory: Negative for cough and shortness of breath.   Cardiovascular: Negative for chest pain.  Gastrointestinal: Positive for nausea. Negative for abdominal pain, diarrhea and vomiting.  Genitourinary: Positive for decreased urine volume. Negative for flank pain.  Musculoskeletal: Negative for back pain and neck pain.  Skin: Negative for rash and wound.  Neurological: Positive for weakness.  Psychiatric/Behavioral: Positive for confusion.  All other systems reviewed and are negative.   Physical Exam Updated Vital Signs BP (!) 142/64 (BP Location: Right Arm)   Pulse 83   Temp (!) 103.3 F (39.6 C) (Oral)   Resp 18   Ht 5\' 6"  (1.676 m)   Wt 133 kg   SpO2 95%   BMI 47.33 kg/m   Physical Exam Vitals and nursing note reviewed.  Constitutional:      General: She is not in acute distress.    Appearance: She is well-developed.  HENT:     Head: Normocephalic and atraumatic.     Mouth/Throat:     Mouth:  Mucous membranes are dry.  Eyes:     Conjunctiva/sclera: Conjunctivae normal.  Cardiovascular:     Rate and Rhythm: Normal rate and  regular rhythm.     Heart sounds: Normal heart sounds.  Pulmonary:     Effort: Pulmonary effort is normal. No respiratory distress.     Breath sounds: No wheezing.  Abdominal:     General: Abdomen is flat. There is no distension.     Tenderness: There is abdominal tenderness (Suprapubic, without guarding).  Musculoskeletal:     Cervical back: Neck supple.  Skin:    General: Skin is warm.     Capillary Refill: Capillary refill takes less than 2 seconds.  Neurological:     Mental Status: She is alert and oriented to person, place, and time.     Motor: No abnormal muscle tone.     ED Results / Procedures / Treatments   Labs (all labs ordered are listed, but only abnormal results are displayed) Labs Reviewed  LACTIC ACID, PLASMA - Abnormal; Notable for the following components:      Result Value   Lactic Acid, Venous 2.4 (*)    All other components within normal limits  CBC WITH DIFFERENTIAL/PLATELET - Abnormal; Notable for the following components:   WBC 10.8 (*)    Platelets 136 (*)    Neutro Abs 9.5 (*)    Lymphs Abs 0.4 (*)    All other components within normal limits  URINALYSIS, COMPLETE (UACMP) WITH MICROSCOPIC - Abnormal; Notable for the following components:   Color, Urine YELLOW (*)    APPearance TURBID (*)    pH 9.0 (*)    Nitrite POSITIVE (*)    Leukocytes,Ua LARGE (*)    All other components within normal limits  COMPREHENSIVE METABOLIC PANEL - Abnormal; Notable for the following components:   Sodium 132 (*)    Chloride 95 (*)    Glucose, Bld 148 (*)    Creatinine, Ser 1.33 (*)    GFR calc non Af Amer 41 (*)    GFR calc Af Amer 47 (*)    All other components within normal limits  CULTURE, BLOOD (ROUTINE X 2)  CULTURE, BLOOD (ROUTINE X 2)  URINE CULTURE  LACTIC ACID, PLASMA    EKG None  Radiology No results found.  Procedures .Critical Care Performed by: Duffy Bruce, MD Authorized by: Duffy Bruce, MD   Critical care provider statement:     Critical care time (minutes):  35   Critical care time was exclusive of:  Separately billable procedures and treating other patients and teaching time   Critical care was necessary to treat or prevent imminent or life-threatening deterioration of the following conditions:  Cardiac failure, circulatory failure and sepsis   Critical care was time spent personally by me on the following activities:  Development of treatment plan with patient or surrogate, discussions with consultants, evaluation of patient's response to treatment, examination of patient, obtaining history from patient or surrogate, ordering and performing treatments and interventions, ordering and review of laboratory studies, ordering and review of radiographic studies, pulse oximetry, re-evaluation of patient's condition and review of old charts   I assumed direction of critical care for this patient from another provider in my specialty: no   .1-3 Lead EKG Interpretation Performed by: Duffy Bruce, MD Authorized by: Duffy Bruce, MD     Interpretation: normal     ECG rate:  80s   ECG rate assessment: normal     Rhythm: sinus rhythm  Ectopy: none     Conduction: normal   Comments:     Indication: Sepsis   (including critical care time)  Medications Ordered in ED Medications  lactated ringers bolus 1,000 mL (has no administration in time range)  ceFEPIme (MAXIPIME) 2 g in sodium chloride 0.9 % 100 mL IVPB (2 g Intravenous New Bag/Given 10/17/19 1654)  lactated ringers bolus 1,000 mL (1,000 mLs Intravenous New Bag/Given 10/17/19 1652)    ED Course  I have reviewed the triage vital signs and the nursing notes.  Pertinent labs & imaging results that were available during my care of the patient were reviewed by me and considered in my medical decision making (see chart for details).    MDM Rules/Calculators/A&P                          69 year old female here with sepsis, likely secondary to catheter  associated UTI.  Patient is febrile, but nontoxic on exam.  She is mentating at her baseline.  Lab work shows leukocytosis, mild lactic acidosis, and possible mild AKI.  Patient started on broad-spectrum antibiotics.  Will admit. Abdomen is otherwise soft. No rash or other evidence of infection clinically. Normal WOB and satting well on RA.   Of note, UA shows no pyuria though +nitrites and bacteria on dip. Will check CT [place] to eval for alternative etiologies, though suspect this is just erroneous 2/2 partial treatment as an outpatient. Admit to Dr. Jonelle Sidle with hospitalist service.   Final Clinical Impression(s) / ED Diagnoses Final diagnoses:  Sepsis due to urinary tract infection [facility])    Rx / DC Orders ED Discharge Orders    None       Duffy Bruce, MD 10/17/19 1849

## 2019-10-18 ENCOUNTER — Encounter: Payer: Self-pay | Admitting: Internal Medicine

## 2019-10-18 DIAGNOSIS — R7881 Bacteremia: Secondary | ICD-10-CM

## 2019-10-18 LAB — COMPREHENSIVE METABOLIC PANEL
ALT: 16 U/L (ref 0–44)
AST: 21 U/L (ref 15–41)
Albumin: 3.3 g/dL — ABNORMAL LOW (ref 3.5–5.0)
Alkaline Phosphatase: 56 U/L (ref 38–126)
Anion gap: 10 (ref 5–15)
BUN: 16 mg/dL (ref 8–23)
CO2: 26 mmol/L (ref 22–32)
Calcium: 8.5 mg/dL — ABNORMAL LOW (ref 8.9–10.3)
Chloride: 98 mmol/L (ref 98–111)
Creatinine, Ser: 1.34 mg/dL — ABNORMAL HIGH (ref 0.44–1.00)
GFR calc Af Amer: 47 mL/min — ABNORMAL LOW (ref >60)
GFR calc non Af Amer: 41 mL/min — ABNORMAL LOW (ref >60)
Glucose, Bld: 110 mg/dL — ABNORMAL HIGH (ref 70–99)
Potassium: 3.7 mmol/L (ref 3.5–5.1)
Sodium: 134 mmol/L — ABNORMAL LOW (ref 135–145)
Total Bilirubin: 0.7 mg/dL (ref 0.3–1.2)
Total Protein: 6.6 g/dL (ref 6.5–8.1)

## 2019-10-18 LAB — PROTIME-INR
INR: 1.1 (ref 0.8–1.2)
Prothrombin Time: 14.1 seconds (ref 11.4–15.2)

## 2019-10-18 LAB — BLOOD CULTURE ID PANEL (REFLEXED)

## 2019-10-18 LAB — CBC
HCT: 32 % — ABNORMAL LOW (ref 36.0–46.0)
Hemoglobin: 10.7 g/dL — ABNORMAL LOW (ref 12.0–15.0)
MCH: 28.6 pg (ref 26.0–34.0)
MCHC: 33.4 g/dL (ref 30.0–36.0)
MCV: 85.6 fL (ref 80.0–100.0)
Platelets: 142 10*3/uL — ABNORMAL LOW (ref 150–400)
RBC: 3.74 MIL/uL — ABNORMAL LOW (ref 3.87–5.11)
RDW: 15 % (ref 11.5–15.5)
WBC: 10.7 10*3/uL — ABNORMAL HIGH (ref 4.0–10.5)
nRBC: 0 % (ref 0.0–0.2)

## 2019-10-18 LAB — CORTISOL-AM, BLOOD: Cortisol - AM: 14 ug/dL (ref 6.7–22.6)

## 2019-10-18 LAB — PROCALCITONIN: Procalcitonin: 4.16 ng/mL

## 2019-10-18 MED ORDER — IPRATROPIUM-ALBUTEROL 0.5-2.5 (3) MG/3ML IN SOLN: 3.0000 mL | RESPIRATORY_TRACT | Status: DC | PRN

## 2019-10-18 MED ORDER — QUETIAPINE FUMARATE 25 MG PO TABS
150.0000 mg | ORAL_TABLET | Freq: Every day | ORAL | Status: DC
  Administered 2019-10-18 – 2019-10-21 (×4): 150 mg via ORAL
  Filled 2019-10-18 (×4): qty 6

## 2019-10-18 MED ORDER — AMLODIPINE BESYLATE 5 MG PO TABS
5.0000 mg | ORAL_TABLET | Freq: Every day | ORAL | Status: DC
  Administered 2019-10-18 – 2019-10-22 (×5): 5 mg via ORAL
  Filled 2019-10-18 (×5): qty 1

## 2019-10-18 MED ORDER — CEFAZOLIN SODIUM-DEXTROSE 2-4 GM/100ML-% IV SOLN
2.0000 g | Freq: Two times a day (BID) | INTRAVENOUS | Status: DC
  Filled 2019-10-18: qty 100

## 2019-10-18 MED ORDER — DULOXETINE HCL 30 MG PO CPEP
60.0000 mg | ORAL_CAPSULE | Freq: Every day | ORAL | Status: DC
  Administered 2019-10-18 – 2019-10-22 (×5): 60 mg via ORAL
  Filled 2019-10-18: qty 2
  Filled 2019-10-18: qty 1
  Filled 2019-10-18 (×3): qty 2

## 2019-10-18 MED ORDER — AMOXICILLIN-POT CLAVULANATE 875-125 MG PO TABS: 1.0000 | ORAL_TABLET | Freq: Two times a day (BID) | ORAL | Status: DC

## 2019-10-18 MED ORDER — CEFAZOLIN SODIUM-DEXTROSE 2-4 GM/100ML-% IV SOLN
2.0000 g | Freq: Three times a day (TID) | INTRAVENOUS | Status: DC
  Administered 2019-10-18: 2 g via INTRAVENOUS
  Filled 2019-10-18 (×2): qty 100

## 2019-10-18 MED ORDER — FAMOTIDINE 20 MG PO TABS
20.0000 mg | ORAL_TABLET | Freq: Every day | ORAL | Status: DC
  Administered 2019-10-18 – 2019-10-22 (×5): 20 mg via ORAL
  Filled 2019-10-18 (×5): qty 1

## 2019-10-18 MED ORDER — SODIUM CHLORIDE 0.9 % IV SOLN
3.0000 g | Freq: Four times a day (QID) | INTRAVENOUS | Status: DC
  Administered 2019-10-18 – 2019-10-22 (×15): 3 g via INTRAVENOUS
  Filled 2019-10-18 (×2): qty 3
  Filled 2019-10-18 (×2): qty 8
  Filled 2019-10-18 (×3): qty 3
  Filled 2019-10-18 (×3): qty 8
  Filled 2019-10-18 (×4): qty 3
  Filled 2019-10-18 (×2): qty 8
  Filled 2019-10-18 (×2): qty 3
  Filled 2019-10-18 (×2): qty 8
  Filled 2019-10-18: qty 3

## 2019-10-18 MED ORDER — ATORVASTATIN CALCIUM 20 MG PO TABS
10.0000 mg | ORAL_TABLET | Freq: Every day | ORAL | Status: DC
  Administered 2019-10-18 – 2019-10-22 (×5): 10 mg via ORAL
  Filled 2019-10-18 (×5): qty 1

## 2019-10-18 MED ORDER — CHLORHEXIDINE GLUCONATE CLOTH 2 % EX PADS
6.0000 | MEDICATED_PAD | Freq: Every day | CUTANEOUS | Status: DC
  Administered 2019-10-18 – 2019-10-22 (×5): 6 via TOPICAL

## 2019-10-18 NOTE — Progress Notes (Signed)
PROGRESS NOTE    [name]  MOQ:[id number] DOB: [birth year] DOA: 10/17/2019 PCP: Kirk Ruths, MD    Assessment & Plan:   Principal Problem:   Sepsis secondary to UTI [facility]) Active Problems:   COPD with bronchial hyperresponsiveness ([place])   Major depressive disorder, recurrent, in partial remission ([place])   Hypertension goal BP (blood pressure) < 140/90   Current tobacco use   Obstructive sleep apnea of adult   CKD (chronic kidney disease) stage 3, GFR 30-59 ml/min   Altered mental status   UTI (urinary tract infection)   Sepsis ([place])   Sepsis: secondary to UTI & bacteremia. Continue on IV ancef. Continue on IVFs  UTI: UA is positive, urine cx is pending. Continue on IV ancef. Likely secondary to chronic foley. Foley was exchanged in the ER   Bacteremia: blood cx growing strept agalactiae, sens pending. Continue on IV ancef   Cellulitis of LLE: likely secondary to cat scratch. Will start po augmentin as well   Leukocytosis: likely secondary to above infections. Continue on abxs  Thrombocytopenia: etiology unclear, mild. Will continue to monitor   COPD: no acute exacerbation. Bronchodilators prn    Hypertension: will start home dose of amlodipine  Altered mental status: Secondary to UTI & bacteremia.  Depression: severity unknown. Continue on home dose of duloxetine, quetiapine  HLD: will continue on statin   CKD IIIb: Cr is trending up from day prior slightly. Hold home dose of lisinopril. Will continue to monitor.   Tobacco abuse: smoking cessation counseling   Multiple sclerosis and other neurogenic disorders: Chronic and stable.  Has chronic indwelling catheter.      DVT prophylaxis: lovenox Code Status: full  Family Communication:  Disposition Plan: depends on PT/OT recs  Status is: Inpatient  Remains inpatient appropriate because:IV treatments appropriate due to intensity of illness or inability to take PO   Dispo: The patient is from:  Home              Anticipated d/c is to: SNF vs home health               Anticipated d/c date is: 3 days              Patient currently is not medically stable to d/c.     Consultants:      Procedures:    Antimicrobials: ancef, augmentin    Subjective: Pt c/o malaise   Objective: Vitals:   10/18/19 0040 10/18/19 0100 10/18/19 0300 10/18/19 0700  BP: 119/62 120/66 139/68 (!) 151/79  Pulse:  73 69 67  Resp: 20 (!) 28 18 11   Temp: 98.2 F (36.8 C)  98.4 F (36.9 C)   TempSrc: Oral  Oral   SpO2:  94% 95% 100%  Weight:      Height:        Intake/Output Summary (Last 24 hours) at 10/18/2019 0852 Last data filed at 10/18/2019 0658 Gross per 24 hour  Intake 2205.24 ml  Output 2200 ml  Net 5.24 ml   Filed Weights   10/17/19 1529  Weight: 133 kg    Examination:  General exam: Appears calm and comfortable  Respiratory system: decreased breath sounds b/l.  Cardiovascular system: S1 & S2+. No rubs, gallops or clicks. Gastrointestinal system: Abdomen is nondistended, soft and nontender. Normal bowel sounds heard. Central nervous system: Alert and oriented. Moves all 4 extremities  Psychiatry: Judgement and insight appear normal. Mood & affect appropriate.     Data Reviewed: I  have personally reviewed following labs and imaging studies  CBC: Recent Labs  Lab 10/17/19 1553 10/18/19 0703  WBC 10.8* 10.7*  NEUTROABS 9.5*  --   HGB 12.9 10.7*  HCT 38.3 32.0*  MCV 84.4 85.6  PLT 136* 212*   Basic Metabolic Panel: Recent Labs  Lab 10/17/19 1636 10/18/19 0703  NA 132* 134*  K 4.6 3.7  CL 95* 98  CO2 27 26  GLUCOSE 148* 110*  BUN 18 16  CREATININE 1.33* 1.34*  CALCIUM 9.4 8.5*   GFR: Estimated Creatinine Clearance: 56.3 mL/min (A) (by C-G formula based on SCr of 1.34 mg/dL (H)). Liver Function Tests: Recent Labs  Lab 10/17/19 1636 10/18/19 0703  AST 26 21  ALT 19 16  ALKPHOS 79 56  BILITOT 0.9 0.7  PROT 8.0 6.6  ALBUMIN 4.1 3.3*   No  results for input(s): LIPASE, AMYLASE in the last 168 hours. No results for input(s): AMMONIA in the last 168 hours. Coagulation Profile: Recent Labs  Lab 10/18/19 0703  INR 1.1   Cardiac Enzymes: No results for input(s): CKTOTAL, CKMB, CKMBINDEX, TROPONINI in the last 168 hours. BNP (last 3 results) No results for input(s): PROBNP in the last 8760 hours. HbA1C: No results for input(s): HGBA1C in the last 72 hours. CBG: No results for input(s): GLUCAP in the last 168 hours. Lipid Profile: No results for input(s): CHOL, HDL, LDLCALC, TRIG, CHOLHDL, LDLDIRECT in the last 72 hours. Thyroid Function Tests: No results for input(s): TSH, T4TOTAL, FREET4, T3FREE, THYROIDAB in the last 72 hours. Anemia Panel: No results for input(s): VITAMINB12, FOLATE, FERRITIN, TIBC, IRON, RETICCTPCT in the last 72 hours. Sepsis Labs: Recent Labs  Lab 10/17/19 1552 10/17/19 1922 10/18/19 0703  PROCALCITON  --   --  4.16  LATICACIDVEN 2.4* 2.0*  --     Recent Results (from the past 240 hour(s))  Blood culture (routine x 2)     Status: None (Preliminary result)   Collection Time: 10/17/19  3:54 PM   Specimen: BLOOD  Result Value Ref Range Status   Specimen Description BLOOD RIGHT ANTECUBITAL  Final   Special Requests   Final    BOTTLES DRAWN AEROBIC AND ANAEROBIC Blood Culture results may not be optimal due to an inadequate volume of blood received in culture bottles   Culture  Setup Time   Final    Organism ID to follow ANAEROBIC BOTTLE ONLY GRAM POSITIVE COCCI CRITICAL RESULT CALLED TO, READ BACK BY AND VERIFIED WITH: DAVID BESANTI AT [place] ON 10/18/19 RWW Performed at [facility], [address] [place],  [address]    Culture GRAM POSITIVE COCCI  Final   Report Status PENDING  Incomplete  Blood Culture ID Panel (Reflexed)     Status: Abnormal   Collection Time: 10/17/19  3:54 PM  Result Value Ref Range Status   Enterococcus species NOT DETECTED NOT DETECTED Final    Listeria monocytogenes NOT DETECTED NOT DETECTED Final   Staphylococcus species NOT DETECTED NOT DETECTED Final   Staphylococcus aureus (BCID) NOT DETECTED NOT DETECTED Final   Streptococcus species DETECTED (A) NOT DETECTED Final    Comment: CRITICAL RESULT CALLED TO, READ BACK BY AND VERIFIED WITH: DAVID BESANTI AT 0425 ON 10/18/19 RWW    Streptococcus agalactiae DETECTED (A) NOT DETECTED Final    Comment: CRITICAL RESULT CALLED TO, READ BACK BY AND VERIFIED WITH: DAVID BESANTI AT 0425 ON 10/18/19 RWW    Streptococcus pneumoniae NOT DETECTED NOT DETECTED Final   Streptococcus pyogenes  NOT DETECTED NOT DETECTED Final   Acinetobacter baumannii NOT DETECTED NOT DETECTED Final   Enterobacteriaceae species NOT DETECTED NOT DETECTED Final   Enterobacter cloacae complex NOT DETECTED NOT DETECTED Final   Escherichia coli NOT DETECTED NOT DETECTED Final   Klebsiella oxytoca NOT DETECTED NOT DETECTED Final   Klebsiella pneumoniae NOT DETECTED NOT DETECTED Final   Proteus species NOT DETECTED NOT DETECTED Final   Serratia marcescens NOT DETECTED NOT DETECTED Final   Haemophilus influenzae NOT DETECTED NOT DETECTED Final   Neisseria meningitidis NOT DETECTED NOT DETECTED Final   Pseudomonas aeruginosa NOT DETECTED NOT DETECTED Final   Candida albicans NOT DETECTED NOT DETECTED Final   Candida glabrata NOT DETECTED NOT DETECTED Final   Candida krusei NOT DETECTED NOT DETECTED Final   Candida parapsilosis NOT DETECTED NOT DETECTED Final   Candida tropicalis NOT DETECTED NOT DETECTED Final    Comment: Performed at [facility], [address] [place], [place] [address]  Blood culture (routine x 2)     Status: None (Preliminary result)   Collection Time: 10/17/19  4:36 PM   Specimen: BLOOD  Result Value Ref Range Status   Specimen Description BLOOD BLOOD LEFT ARM  Final   Special Requests   Final    BOTTLES DRAWN AEROBIC AND ANAEROBIC Blood Culture adequate volume   Culture  Setup  Time   Final    GRAM POSITIVE COCCI IN BOTH AEROBIC AND ANAEROBIC BOTTLES CRITICAL RESULT CALLED TO, READ BACK BY AND VERIFIED WITH: DAVID BESANTI AT [place] ON 10/18/19 RWW Performed at [facility], [address] [place], [place] [address]    Culture GRAM POSITIVE COCCI  Final   Report Status PENDING  Incomplete  SARS Coronavirus 2 by RT PCR (hospital order, performed in [place] hospital lab) Nasopharyngeal Nasopharyngeal Swab     Status: None   Collection Time: 10/17/19  7:22 PM   Specimen: Nasopharyngeal Swab  Result Value Ref Range Status   SARS Coronavirus 2 NEGATIVE NEGATIVE Final    Comment: (NOTE) SARS-CoV-2 target nucleic acids are NOT DETECTED.  The SARS-CoV-2 RNA is generally detectable in upper and lower respiratory specimens during the acute phase of infection. The lowest concentration of SARS-CoV-2 viral copies this assay can detect is 250 copies / mL. A negative result does not preclude SARS-CoV-2 infection and should not be used as the sole basis for treatment or other patient management decisions.  A negative result may occur with improper specimen collection / handling, submission of specimen other than nasopharyngeal swab, presence of viral mutation(s) within the areas targeted by this assay, and inadequate number of viral copies (<250 copies / mL). A negative result must be combined with clinical observations, patient history, and epidemiological information.  Fact Sheet for Patients:   StrictlyIdeas.no  Fact Sheet for Healthcare Providers: BankingDealers.co.za  This test is not yet approved or  cleared by the [place] FDA and has been authorized for detection and/or diagnosis of SARS-CoV-2 by FDA under an Emergency Use Authorization (EUA).  This EUA will remain in effect (meaning this test can be used) for the duration of the COVID-19 declaration under Section 564(b)(1) of the Act, 21  U.S.C. section 360bbb-3(b)(1), unless the authorization is terminated or revoked sooner.  Performed at [name], [address] [place], [place] [address]          Radiology Studies: CT Renal Stone Study  Result Date: 10/17/2019 CLINICAL DATA:  69 year old female with concern for kidney stone. EXAM:  CT ABDOMEN AND PELVIS WITHOUT CONTRAST TECHNIQUE: Multidetector CT imaging of the abdomen and pelvis was performed following the standard protocol without IV contrast. COMPARISON:  CT abdomen pelvis dated 01/05/2019. FINDINGS: Evaluation of this exam is limited in the absence of intravenous contrast. Evaluation is also limited due to respiratory motion artifact. Lower chest: The visualized lung bases are clear. Coronary vascular calcifications noted. No intra-abdominal free air or free fluid. Hepatobiliary: The liver and gallbladder appear unremarkable. Pancreas: Unremarkable. No pancreatic ductal dilatation or surrounding inflammatory changes. Spleen: Normal in size without focal abnormality. Adrenals/Urinary Tract: The adrenal glands are unremarkable. Mild fullness of the renal collecting systems bilaterally. No renal calculi identified. A 1 cm hypodense lesion in the upper pole of the right kidney is not characterized on this CT but appears similar to prior CT. The urinary bladder is predominantly collapsed around a Foley catheter. Stomach/Bowel: There is moderate stool throughout the colon. No bowel obstruction or active inflammation. The appendix is normal. Vascular/Lymphatic: Advanced aortoiliac atherosclerotic disease. The IVC is unremarkable. No portal venous gas. Mildly enlarged retroperitoneal lymph nodes slightly increased in size since the prior CT. For example a lymph node anterior to the aorta measuring 10 mm in short axis (37/8) previously measured 8 mm. Reproductive: The uterus is anteverted and grossly unremarkable. Septated/complex appearing left adnexal cystic lesion  measures 3.6 x 7.8 cm greatest axial dimensions. This can be better evaluated with ultrasound or MRI if not previously performed. Other: Diffuse subcutaneous edema. There is diastasis of anterior abdominal wall musculature. Musculoskeletal: Osteopenia with degenerative changes of the spine and multilevel laminectomy and lower lumbar fusion. No acute osseous pathology. IMPRESSION: 1. No acute intra-abdominal or pelvic pathology. Mild fullness of the renal collecting system. No kidney stone. 2. Moderate colonic stool burden. No bowel obstruction. Normal appendix. 3. Septated/complex appearing left adnexal cystic lesion. This can be better evaluated with ultrasound or MRI if not previously performed. 4. Aortic Atherosclerosis (ICD10-I70.0). Electronically Signed   By: Anner Crete M.D.   On: 10/17/2019 18:43        Scheduled Meds: . enoxaparin (LOVENOX) injection  40 mg Subcutaneous Q24H  . famotidine  20 mg Oral Daily   Continuous Infusions: .  ceFAZolin (ANCEF) IV Stopped (10/18/19 4327)  . lactated ringers    . lactated ringers 125 mL/hr at 10/18/19 0625     LOS: 1 day    Time spent: 34 mins     Wyvonnia Dusky, MD [facility] Pager [phone]  If 7PM-7AM, please contact night-coverage [email or web address] 10/18/2019, 8:52 AM

## 2019-10-18 NOTE — ED Notes (Signed)
Pt given a full bed bath and bed linen was also changed. Pt incontinent of stool and was cleaned. Pt was repositioned in bed. Pt foley changed by Vicente Males, RN with assistance of this tech. Pt was given breakfast tray and ate 25%. Pt c/o "this stinks and I can not eat any more of it." Breakfast tray was removed from rm. Pt has no further needs at this time. Call light within reach.

## 2019-10-18 NOTE — Progress Notes (Signed)
PHARMACY - PHYSICIAN COMMUNICATION CRITICAL VALUE ALERT - BLOOD CULTURE IDENTIFICATION (BCID)  [name] is an 69 y.o. female who presented to [facility] on 10/17/2019 with a chief complaint of fevers and chills  Assessment:  Tmax 103.60F, LA 2.4 >> 2.0, WBC 10.8, UA leukocytes +, nitrites +, mucus present, triphosphate crystals present. Patient also has an external urinary straight cath for 119 days?, stage II pressure injury, multiple other sites of wounds/injuries. 3/4 GPC BCID Streptococcus Agalactiaea.  CT renal stone study: IMPRESSION: 1. No acute intra-abdominal or pelvic pathology. Mild fullness of the renal collecting system. No kidney stone. 2. Moderate colonic stool burden. No bowel obstruction. Normal appendix. 3. Septated/complex appearing left adnexal cystic lesion. This can be better evaluated with ultrasound or MRI if not previously performed.  Name of physician (or Provider) Contacted: Sharion Settler  Current antibiotics: ceftriaxone 1g IV q24h  Changes to prescribed antibiotics recommended:  Recommendations accepted by provider -- switching to cefazolin 2g IV q8h per CrCl > 50 ml/min. Will continue to monitor and adjust doses per changes in renal function and f/u on cx/sx's.  Results for orders placed or performed during the hospital encounter of 10/17/19  Blood Culture ID Panel (Reflexed) (Collected: 10/17/2019  3:54 PM)  Result Value Ref Range   Enterococcus species NOT DETECTED NOT DETECTED   Listeria monocytogenes NOT DETECTED NOT DETECTED   Staphylococcus species NOT DETECTED NOT DETECTED   Staphylococcus aureus (BCID) NOT DETECTED NOT DETECTED   Streptococcus species DETECTED (A) NOT DETECTED   Streptococcus agalactiae DETECTED (A) NOT DETECTED   Streptococcus pneumoniae NOT DETECTED NOT DETECTED   Streptococcus pyogenes NOT DETECTED NOT DETECTED   Acinetobacter baumannii NOT DETECTED NOT DETECTED   Enterobacteriaceae species NOT DETECTED NOT DETECTED    Enterobacter cloacae complex NOT DETECTED NOT DETECTED   Escherichia coli NOT DETECTED NOT DETECTED   Klebsiella oxytoca NOT DETECTED NOT DETECTED   Klebsiella pneumoniae NOT DETECTED NOT DETECTED   Proteus species NOT DETECTED NOT DETECTED   Serratia marcescens NOT DETECTED NOT DETECTED   Haemophilus influenzae NOT DETECTED NOT DETECTED   Neisseria meningitidis NOT DETECTED NOT DETECTED   Pseudomonas aeruginosa NOT DETECTED NOT DETECTED   Candida albicans NOT DETECTED NOT DETECTED   Candida glabrata NOT DETECTED NOT DETECTED   Candida krusei NOT DETECTED NOT DETECTED   Candida parapsilosis NOT DETECTED NOT DETECTED   Candida tropicalis NOT DETECTED NOT DETECTED   Tobie Lords, PharmD, BCPS Clinical Pharmacist 10/18/2019  5:48 AM

## 2019-10-19 LAB — URINE CULTURE: Culture: >=100000 — AB

## 2019-10-19 LAB — BASIC METABOLIC PANEL
Anion gap: 6 (ref 5–15)
BUN: 14 mg/dL (ref 8–23)
CO2: 28 mmol/L (ref 22–32)
Calcium: 8.9 mg/dL (ref 8.9–10.3)
Chloride: 105 mmol/L (ref 98–111)
Creatinine, Ser: 1.13 mg/dL — ABNORMAL HIGH (ref 0.44–1.00)
GFR calc Af Amer: 58 mL/min — ABNORMAL LOW (ref >60)
GFR calc non Af Amer: 50 mL/min — ABNORMAL LOW (ref >60)
Glucose, Bld: 97 mg/dL (ref 70–99)
Potassium: 3.3 mmol/L — ABNORMAL LOW (ref 3.5–5.1)
Sodium: 139 mmol/L (ref 135–145)

## 2019-10-19 LAB — CBC
HCT: 34.5 % — ABNORMAL LOW (ref 36.0–46.0)
Hemoglobin: 11.5 g/dL — ABNORMAL LOW (ref 12.0–15.0)
MCH: 28.5 pg (ref 26.0–34.0)
MCHC: 33.3 g/dL (ref 30.0–36.0)
MCV: 85.6 fL (ref 80.0–100.0)
Platelets: 131 10*3/uL — ABNORMAL LOW (ref 150–400)
RBC: 4.03 MIL/uL (ref 3.87–5.11)
RDW: 15 % (ref 11.5–15.5)
WBC: 8.2 10*3/uL (ref 4.0–10.5)
nRBC: 0 % (ref 0.0–0.2)

## 2019-10-19 MED ORDER — MELATONIN 5 MG PO TABS
5.0000 mg | ORAL_TABLET | Freq: Every day | ORAL | Status: DC
  Administered 2019-10-19 – 2019-10-21 (×3): 5 mg via ORAL
  Filled 2019-10-19 (×4): qty 1

## 2019-10-19 MED ORDER — TIZANIDINE HCL 4 MG PO TABS
4.0000 mg | ORAL_TABLET | Freq: Three times a day (TID) | ORAL | Status: DC
  Administered 2019-10-19 – 2019-10-22 (×10): 4 mg via ORAL
  Filled 2019-10-19 (×12): qty 1

## 2019-10-19 MED ORDER — GABAPENTIN 300 MG PO CAPS
600.0000 mg | ORAL_CAPSULE | Freq: Three times a day (TID) | ORAL | Status: DC
  Administered 2019-10-19 – 2019-10-22 (×10): 600 mg via ORAL
  Filled 2019-10-19 (×10): qty 2

## 2019-10-19 MED ORDER — GABAPENTIN 300 MG PO CAPS: 600.0000 mg | ORAL_CAPSULE | Freq: Two times a day (BID) | ORAL | Status: DC

## 2019-10-19 MED ORDER — ENOXAPARIN SODIUM 40 MG/0.4ML ~~LOC~~ SOLN
40.0000 mg | Freq: Two times a day (BID) | SUBCUTANEOUS | Status: DC
  Administered 2019-10-19 – 2019-10-22 (×7): 40 mg via SUBCUTANEOUS
  Filled 2019-10-19 (×7): qty 0.4

## 2019-10-19 MED ORDER — BUPROPION HCL ER (XL) 150 MG PO TB24
300.0000 mg | ORAL_TABLET | Freq: Every day | ORAL | Status: DC
  Administered 2019-10-19 – 2019-10-22 (×4): 300 mg via ORAL
  Filled 2019-10-19 (×4): qty 2

## 2019-10-19 MED ORDER — POTASSIUM CHLORIDE CRYS ER 20 MEQ PO TBCR
20.0000 meq | EXTENDED_RELEASE_TABLET | Freq: Once | ORAL | Status: AC
  Administered 2019-10-19: 10:00:00 20 meq via ORAL
  Filled 2019-10-19: qty 1

## 2019-10-19 MED ORDER — CLONAZEPAM 0.5 MG PO TABS: 0.5000 mg | ORAL_TABLET | Freq: Every evening | ORAL | Status: DC | PRN

## 2019-10-19 NOTE — Progress Notes (Signed)
PHARMACIST - PHYSICIAN COMMUNICATION  CONCERNING:  Enoxaparin (Lovenox) for DVT Prophylaxis   RECOMMENDATION: Patient was prescribed enoxaprin 40mg  q24 hours for VTE prophylaxis.   Filed Weights   10/17/19 1529  Weight: 133 kg (293 lb 3.4 oz)    Body mass index is 47.33 kg/m.  Estimated Creatinine Clearance: 66.8 mL/min (A) (by C-G formula based on SCr of 1.13 mg/dL (H)).   Based on [place] patient is candidate for enoxaparin 40mg  every 12 hour dosing due to BMI being >40. (BMI 47.3)  DESCRIPTION: Pharmacy has adjusted enoxaparin dose per [facility] policy.  Patient is now receiving enoxaparin 40mg  every 12 hours.   Gerald Dexter, PharmD Clinical Pharmacist  10/19/2019 9:43 AM

## 2019-10-19 NOTE — Progress Notes (Signed)
OT Cancellation Note  Patient Details Name: [name] MRN: [id number] DOB: 1950/09/06   Cancelled Treatment:    Reason Eval/Treat Not Completed: Other (comment). Order received, chart reviewed. Pt working with PT. Will re-attempt OT evaluation at later date/time as pt is available and medically appropriate.  Jeni Salles, MPH, MS, OTR/L ascom [phone] 10/19/19, 3:31 PM

## 2019-10-19 NOTE — Evaluation (Addendum)
Physical Therapy Evaluation Patient Details Name: [name] MRN: [id number] DOB: 1951/02/14 Today's Date: 10/19/2019   History of Present Illness  presented to ER secondary to fever/chills; admitted for management of sepsis related to UTI  Clinical Impression  Patient resting in bed upon arrival to room.  Alert and oriented; follows commands; pleasant and cooperative.  Denies pain at this time, but does endorse baseline weakness to L LE due to MS (3-/5 proximally, 1 to 2-/5 distally; noted L ankle PF contracture).  Currently requiring min/mod assist for bed mobility; min assist +2 for sit/stand, standing balance and lateral stepping x2 with RW. Demonstrates lateral steps edge of bed, min assist +2; decreased step height/length; excessive R lateral weight shift required to unweight/advance L LE.  Generally unsteady (heavy support of bilat UEs) with poor overall activity tolerance.  Additional gait efforts limited due to bowel incontinence.  Will continue to progress as medically appropriate. Would benefit from skilled PT to address above deficits and promote optimal return to PLOF.; Recommend transition to STR upon discharge from acute hospitalization. Will monitor progress and update recommendations as appropriate.  May be appropriate for consideration of higher level of care/long-term care if needs unable to be managed in home environment long-term.    Follow Up Recommendations SNF    Equipment Recommendations       Recommendations for Other Services       Precautions / Restrictions Precautions Precautions: Fall Restrictions Weight Bearing Restrictions: No      Mobility  Bed Mobility Overal bed mobility: Needs Assistance Bed Mobility: Supine to Sit;Sit to Supine     Supine to sit: Min assist;Mod assist Sit to supine: Min assist;Mod assist   General bed mobility comments: assist for LE management, truncal elevation  Transfers Overall transfer level: Needs assistance    Transfers: Sit to/from Stand Sit to Stand: Min assist;+2 physical assistance;From elevated surface         General transfer comment: requires elevated surface, UE support to complete; heavy reliance on R LE with movement transition  Ambulation/Gait   Gait Distance (Feet): 2 Feet Assistive device: Rolling walker (2 wheeled)       General Gait Details: lateral steps edge of bed, min assist +2; decreased step height/length; excessive R lateral weight shift required to unweight/advance L LE.  Additional gait efforts limited due to bowel incontinence.  Stairs            Wheelchair Mobility    Modified Rankin (Stroke Patients Only)       Balance Overall balance assessment: Needs assistance Sitting-balance support: No upper extremity supported;Feet supported Sitting balance-Leahy Scale: Good     Standing balance support: Bilateral upper extremity supported Standing balance-Leahy Scale: Poor                               Pertinent Vitals/Pain Pain Assessment: No/denies pain    Home Living Family/patient expects to be discharged to:: Private residence Living Arrangements: Spouse/significant other Available Help at Discharge: Family;Available 24 hours/day Type of Home: House Home Access: Stairs to enter;Ramped entrance Entrance Stairs-Rails: None Entrance Stairs-Number of Steps: has a ramp, and then up two more steps (has a grab handle on the L) Home Layout: One level Home Equipment: Wheelchair - [employer] - 4 wheels;Bedside commode;Grab bars - tub/shower;Other (comment);Shower seat      Prior Function Level of Independence: Needs assistance         Comments: Ambulatory for limited  household distances with [phone]; sleeps in recliner and utilizes lift chair function to assist with transfers. Husband responsible for household chores, community errands.  Does endorse multiple falls (at least 10) in previous six months, requiring EMS assist for  recovery     Hand Dominance        Extremity/Trunk Assessment   Upper Extremity Assessment Upper Extremity Assessment: Overall WFL for tasks assessed (grossly at least 4-/5 throughout)    Lower Extremity Assessment Lower Extremity Assessment: Generalized weakness (R LE grossly 4-/5; L LE grossly 3-/5 at hip and knee, 1/5 ankle PF/DF, 2-/5 toe extension.  L ankle PF contracture noted.)       Communication   Communication: No difficulties  Cognition Arousal/Alertness: Awake/alert Behavior During Therapy: WFL for tasks assessed/performed Overall Cognitive Status: Within Functional Limits for tasks assessed                                        General Comments      Exercises Other Exercises Other Exercises: Sit/stand x4 with RW, min assist +2 with each trial. Prefers UEs pulling on RW to complete movement transition; relies heavily on R LE, elevated surface to complete transfer Other Exercises: Standing balance with RW, min assist +1; dep of second person for peri-care/hygiene after bowel incontinence   Assessment/Plan    PT Assessment Patient needs continued PT services  PT Problem List Decreased strength;Decreased range of motion;Decreased activity tolerance;Decreased balance;Decreased mobility;Decreased knowledge of use of DME;Decreased safety awareness;Decreased knowledge of precautions;Obesity       PT Treatment Interventions DME instruction;Gait training;Functional mobility training;Therapeutic activities;Balance training;Therapeutic exercise;Patient/family education    PT Goals (Current goals can be found in the Care Plan section)  Acute Rehab PT Goals Patient Stated Goal: to return hoe PT Goal Formulation: With patient Time For Goal Achievement: 11/02/19 Potential to Achieve Goals: Fair    Frequency Min 2X/week   Barriers to discharge Decreased caregiver support      Co-evaluation               AM-PAC PT "6 Clicks" Mobility   Outcome Measure Help needed turning from your back to your side while in a flat bed without using bedrails?: A Little Help needed moving from lying on your back to sitting on the side of a flat bed without using bedrails?: A Lot Help needed moving to and from a bed to a chair (including a wheelchair)?: A Lot Help needed standing up from a chair using your arms (e.g., wheelchair or bedside chair)?: A Lot Help needed to walk in hospital room?: Total Help needed climbing 3-5 steps with a railing? : Total 6 Click Score: 11    End of Session Equipment Utilized During Treatment: Gait belt Activity Tolerance: Patient tolerated treatment well Patient left: with call bell/phone within reach;in bed;with bed alarm set Nurse Communication: Mobility status PT Visit Diagnosis: Repeated falls (R29.6);History of falling (Z91.81);Difficulty in walking, not elsewhere classified (R26.2);Muscle weakness (generalized) (M62.81)    Time: 4696-2952 PT Time Calculation (min) (ACUTE ONLY): 33 min   Charges:   PT Evaluation $PT Eval Moderate Complexity: 1 Mod PT Treatments $Therapeutic Activity: 23-37 mins        Lakeya Mulka H. Owens Shark, PT, DPT, NCS 10/19/19, 9:21 PM [phone]

## 2019-10-19 NOTE — Plan of Care (Signed)

## 2019-10-19 NOTE — Progress Notes (Addendum)
PROGRESS NOTE    [name]  GDJ:[id number] DOB: [birth year] DOA: 10/17/2019 PCP: Kirk Ruths, MD    Assessment & Plan:   Principal Problem:   Sepsis secondary to UTI [facility]) Active Problems:   COPD with bronchial hyperresponsiveness ([place])   Major depressive disorder, recurrent, in partial remission ([place])   Hypertension goal BP (blood pressure) < 140/90   Current tobacco use   Obstructive sleep apnea of adult   CKD (chronic kidney disease) stage 3, GFR 30-59 ml/min   Altered mental status   UTI (urinary tract infection)   Sepsis ([place])   Sepsis: secondary to UTI & bacteremia. Continue on IV unasyn. Continue on IVFs  UTI: UA is positive, urine cx is growing multiple organisms, so repeat urine cx ordered. Continue on IV ancef. Likely secondary to chronic foley. Foley was exchanged in the ER   Bacteremia: blood cx growing strept agalactiae, sens pending. Continue on IV unasyn  Cellulitis of LLE: likely secondary to cat scratch. Will continue on IV unasyn. Improving   Hypokalemia: KCl repleted. Will continue to monitor   Leukocytosis: resolved  Thrombocytopenia: etiology unclear, mild. Will continue to monitor   COPD: no acute exacerbation. Bronchodilators prn    Hypertension: will continue home dose of amlodipine  Altered mental status: secondary to UTI & bacteremia. Improving   Depression: severity unknown. Continue on home dose of duloxetine, quetiapine & bupropion  Likely anxiety: severity unknown. Continue on home dose of klonopin   HLD: will continue on statin   CKD IIIb: Cr is trending down today. Hold home dose of lisinopril. Will continue to monitor.   Tobacco abuse: smoking cessation counseling   Multiple sclerosis and other neurogenic disorders: Chronic and stable.  Has chronic indwelling catheter.  Continue on home dose of tizanidine  Morbid obesity: BMI 47.3. Would benefit greatly from weight loss     DVT prophylaxis: lovenox Code  Status: full  Family Communication:  Disposition Plan: depends on PT/OT recs  Status is: Inpatient  Remains inpatient appropriate because:IV treatments appropriate due to intensity of illness or inability to take PO   Dispo: The patient is from: Home              Anticipated d/c is to: SNF vs home health               Anticipated d/c date is: 3 days              Patient currently is not medically stable to d/c.     Consultants:      Procedures:    Antimicrobials: unasyn    Subjective: Pt c/o not sleeping well   Objective: Vitals:   10/18/19 1554 10/18/19 1952 10/19/19 0003 10/19/19 0451  BP: (!) 120/54 (!) 147/69 (!) 106/45 (!) 143/66  Pulse: 71 70 (!) 59 64  Resp: 20 20 18 20   Temp: 98.8 F (37.1 C) 99 F (37.2 C) 97.7 F (36.5 C) 97.7 F (36.5 C)  TempSrc: Oral   Oral  SpO2: 96% 96% 96% 97%  Weight:      Height:        Intake/Output Summary (Last 24 hours) at 10/19/2019 0719 Last data filed at 10/19/2019 0300 Gross per 24 hour  Intake 150 ml  Output 1450 ml  Net -1300 ml   Filed Weights   10/17/19 1529  Weight: 133 kg    Examination:  General exam: Appears calm and comfortable  Respiratory system: diminished breath sounds b/l.  Cardiovascular system:  S1 & S2+. No rubs, gallops or clicks. Gastrointestinal system: Abdomen is nondistended, soft and nontender. Hypoactive bowel sounds heard. Central nervous system: Alert and oriented. Moves all 4 extremities  Psychiatry: Judgement and insight appear normal. Mood & affect appropriate.     Data Reviewed: I have personally reviewed following labs and imaging studies  CBC: Recent Labs  Lab 10/17/19 1553 10/18/19 0703 10/19/19 0604  WBC 10.8* 10.7* 8.2  NEUTROABS 9.5*  --   --   HGB 12.9 10.7* 11.5*  HCT 38.3 32.0* 34.5*  MCV 84.4 85.6 85.6  PLT 136* 142* 417*   Basic Metabolic Panel: Recent Labs  Lab 10/17/19 1636 10/18/19 0703 10/19/19 0604  NA 132* 134* 139  K 4.6 3.7 3.3*  CL 95*  98 105  CO2 27 26 28   GLUCOSE 148* 110* 97  BUN 18 16 14   CREATININE 1.33* 1.34* 1.13*  CALCIUM 9.4 8.5* 8.9   GFR: Estimated Creatinine Clearance: 66.8 mL/min (A) (by C-G formula based on SCr of 1.13 mg/dL (H)). Liver Function Tests: Recent Labs  Lab 10/17/19 1636 10/18/19 0703  AST 26 21  ALT 19 16  ALKPHOS 79 56  BILITOT 0.9 0.7  PROT 8.0 6.6  ALBUMIN 4.1 3.3*   No results for input(s): LIPASE, AMYLASE in the last 168 hours. No results for input(s): AMMONIA in the last 168 hours. Coagulation Profile: Recent Labs  Lab 10/18/19 0703  INR 1.1   Cardiac Enzymes: No results for input(s): CKTOTAL, CKMB, CKMBINDEX, TROPONINI in the last 168 hours. BNP (last 3 results) No results for input(s): PROBNP in the last 8760 hours. HbA1C: No results for input(s): HGBA1C in the last 72 hours. CBG: No results for input(s): GLUCAP in the last 168 hours. Lipid Profile: No results for input(s): CHOL, HDL, LDLCALC, TRIG, CHOLHDL, LDLDIRECT in the last 72 hours. Thyroid Function Tests: No results for input(s): TSH, T4TOTAL, FREET4, T3FREE, THYROIDAB in the last 72 hours. Anemia Panel: No results for input(s): VITAMINB12, FOLATE, FERRITIN, TIBC, IRON, RETICCTPCT in the last 72 hours. Sepsis Labs: Recent Labs  Lab 10/17/19 1552 10/17/19 1922 10/18/19 0703  PROCALCITON  --   --  4.16  LATICACIDVEN 2.4* 2.0*  --     Recent Results (from the past 240 hour(s))  Blood culture (routine x 2)     Status: None (Preliminary result)   Collection Time: 10/17/19  3:54 PM   Specimen: BLOOD  Result Value Ref Range Status   Specimen Description BLOOD RIGHT ANTECUBITAL  Final   Special Requests   Final    BOTTLES DRAWN AEROBIC AND ANAEROBIC Blood Culture results may not be optimal due to an inadequate volume of blood received in culture bottles   Culture  Setup Time   Final    Organism ID to follow ANAEROBIC BOTTLE ONLY GRAM POSITIVE COCCI CRITICAL RESULT CALLED TO, READ BACK BY AND  VERIFIED WITH: DAVID BESANTI AT [place] ON 10/18/19 RWW Performed at [facility], [address] [place], [place] [address]    Culture GRAM POSITIVE COCCI  Final   Report Status PENDING  Incomplete  Blood Culture ID Panel (Reflexed)     Status: Abnormal   Collection Time: 10/17/19  3:54 PM  Result Value Ref Range Status   Enterococcus species NOT DETECTED NOT DETECTED Final   Listeria monocytogenes NOT DETECTED NOT DETECTED Final   Staphylococcus species NOT DETECTED NOT DETECTED Final   Staphylococcus aureus (BCID) NOT DETECTED NOT DETECTED Final   Streptococcus species DETECTED (A) NOT  DETECTED Final    Comment: CRITICAL RESULT CALLED TO, READ BACK BY AND VERIFIED WITH: DAVID BESANTI AT 0425 ON 10/18/19 RWW    Streptococcus agalactiae DETECTED (A) NOT DETECTED Final    Comment: CRITICAL RESULT CALLED TO, READ BACK BY AND VERIFIED WITH: DAVID BESANTI AT 0425 ON 10/18/19 RWW    Streptococcus pneumoniae NOT DETECTED NOT DETECTED Final   Streptococcus pyogenes NOT DETECTED NOT DETECTED Final   Acinetobacter baumannii NOT DETECTED NOT DETECTED Final   Enterobacteriaceae species NOT DETECTED NOT DETECTED Final   Enterobacter cloacae complex NOT DETECTED NOT DETECTED Final   Escherichia coli NOT DETECTED NOT DETECTED Final   Klebsiella oxytoca NOT DETECTED NOT DETECTED Final   Klebsiella pneumoniae NOT DETECTED NOT DETECTED Final   Proteus species NOT DETECTED NOT DETECTED Final   Serratia marcescens NOT DETECTED NOT DETECTED Final   Haemophilus influenzae NOT DETECTED NOT DETECTED Final   Neisseria meningitidis NOT DETECTED NOT DETECTED Final   Pseudomonas aeruginosa NOT DETECTED NOT DETECTED Final   Candida albicans NOT DETECTED NOT DETECTED Final   Candida glabrata NOT DETECTED NOT DETECTED Final   Candida krusei NOT DETECTED NOT DETECTED Final   Candida parapsilosis NOT DETECTED NOT DETECTED Final   Candida tropicalis NOT DETECTED NOT DETECTED Final    Comment: Performed at  [facility], [address] [name], [place] [address]  Blood culture (routine x 2)     Status: None (Preliminary result)   Collection Time: 10/17/19  4:36 PM   Specimen: BLOOD  Result Value Ref Range Status   Specimen Description BLOOD BLOOD LEFT ARM  Final   Special Requests   Final    BOTTLES DRAWN AEROBIC AND ANAEROBIC Blood Culture adequate volume   Culture  Setup Time   Final    GRAM POSITIVE COCCI IN BOTH AEROBIC AND ANAEROBIC BOTTLES CRITICAL RESULT CALLED TO, READ BACK BY AND VERIFIED WITH: [place] ON 10/18/19 RWW Performed at [facility], [address] [place],  [address]    Culture GRAM POSITIVE COCCI  Final   Report Status PENDING  Incomplete  SARS Coronavirus 2 by RT PCR (hospital order, performed in [place] hospital lab) Nasopharyngeal Nasopharyngeal Swab     Status: None   Collection Time: 10/17/19  7:22 PM   Specimen: Nasopharyngeal Swab  Result Value Ref Range Status   SARS Coronavirus 2 NEGATIVE NEGATIVE Final    Comment: (NOTE) SARS-CoV-2 target nucleic acids are NOT DETECTED.  The SARS-CoV-2 RNA is generally detectable in upper and lower respiratory specimens during the acute phase of infection. The lowest concentration of SARS-CoV-2 viral copies this assay can detect is 250 copies / mL. A negative result does not preclude SARS-CoV-2 infection and should not be used as the sole basis for treatment or other patient management decisions.  A negative result may occur with improper specimen collection / handling, submission of specimen other than nasopharyngeal swab, presence of viral mutation(s) within the areas targeted by this assay, and inadequate number of viral copies (<250 copies / mL). A negative result must be combined with clinical observations, patient history, and epidemiological information.  Fact Sheet for Patients:   StrictlyIdeas.no  Fact Sheet for Healthcare  Providers: BankingDealers.co.za  This test is not yet approved or  cleared by the [place] FDA and has been authorized for detection and/or diagnosis of SARS-CoV-2 by FDA under an Emergency Use Authorization (EUA).  This EUA will remain in effect (meaning this test can be used)  for the duration of the COVID-19 declaration under Section 564(b)(1) of the Act, 21 U.S.C. section 360bbb-3(b)(1), unless the authorization is terminated or revoked sooner.  Performed at [facility], [address] [place], [place] [address]          Radiology Studies: CT Renal Stone Study  Result Date: 10/17/2019 CLINICAL DATA:  69 year old female with concern for kidney stone. EXAM: CT ABDOMEN AND PELVIS WITHOUT CONTRAST TECHNIQUE: Multidetector CT imaging of the abdomen and pelvis was performed following the standard protocol without IV contrast. COMPARISON:  CT abdomen pelvis dated 01/05/2019. FINDINGS: Evaluation of this exam is limited in the absence of intravenous contrast. Evaluation is also limited due to respiratory motion artifact. Lower chest: The visualized lung bases are clear. Coronary vascular calcifications noted. No intra-abdominal free air or free fluid. Hepatobiliary: The liver and gallbladder appear unremarkable. Pancreas: Unremarkable. No pancreatic ductal dilatation or surrounding inflammatory changes. Spleen: Normal in size without focal abnormality. Adrenals/Urinary Tract: The adrenal glands are unremarkable. Mild fullness of the renal collecting systems bilaterally. No renal calculi identified. A 1 cm hypodense lesion in the upper pole of the right kidney is not characterized on this CT but appears similar to prior CT. The urinary bladder is predominantly collapsed around a Foley catheter. Stomach/Bowel: There is moderate stool throughout the colon. No bowel obstruction or active inflammation. The appendix is normal. Vascular/Lymphatic: Advanced aortoiliac  atherosclerotic disease. The IVC is unremarkable. No portal venous gas. Mildly enlarged retroperitoneal lymph nodes slightly increased in size since the prior CT. For example a lymph node anterior to the aorta measuring 10 mm in short axis (37/8) previously measured 8 mm. Reproductive: The uterus is anteverted and grossly unremarkable. Septated/complex appearing left adnexal cystic lesion measures 3.6 x 7.8 cm greatest axial dimensions. This can be better evaluated with ultrasound or MRI if not previously performed. Other: Diffuse subcutaneous edema. There is diastasis of anterior abdominal wall musculature. Musculoskeletal: Osteopenia with degenerative changes of the spine and multilevel laminectomy and lower lumbar fusion. No acute osseous pathology. IMPRESSION: 1. No acute intra-abdominal or pelvic pathology. Mild fullness of the renal collecting system. No kidney stone. 2. Moderate colonic stool burden. No bowel obstruction. Normal appendix. 3. Septated/complex appearing left adnexal cystic lesion. This can be better evaluated with ultrasound or MRI if not previously performed. 4. Aortic Atherosclerosis (ICD10-I70.0). Electronically Signed   By: Anner Crete M.D.   On: 10/17/2019 18:43        Scheduled Meds: . amLODipine  5 mg Oral Daily  . atorvastatin  10 mg Oral Daily  . Chlorhexidine Gluconate Cloth  6 each Topical Daily  . DULoxetine  60 mg Oral Daily  . enoxaparin (LOVENOX) injection  40 mg Subcutaneous Q24H  . famotidine  20 mg Oral Daily  . QUEtiapine  150 mg Oral QHS   Continuous Infusions: . ampicillin-sulbactam (UNASYN) IV 3 g (10/19/19 0336)  . lactated ringers 75 mL/hr at 10/19/19 0334     LOS: 2 days    Time spent: 32 mins     Wyvonnia Dusky, MD [facility] Pager [phone]  If 7PM-7AM, please contact night-coverage [email or web address] 10/19/2019, 7:19 AM

## 2019-10-20 ENCOUNTER — Inpatient Hospital Stay (HOSPITAL_COMMUNITY)
Admit: 2019-10-20 | Discharge: 2019-10-20 | Disposition: A | Discharge status: Home / Self Care | Payer: Medicare Other | Attending: Internal Medicine | Admitting: Internal Medicine

## 2019-10-20 DIAGNOSIS — L03116 Cellulitis of left lower limb: Secondary | ICD-10-CM

## 2019-10-20 DIAGNOSIS — N189 Chronic kidney disease, unspecified: Secondary | ICD-10-CM

## 2019-10-20 DIAGNOSIS — E785 Hyperlipidemia, unspecified: Secondary | ICD-10-CM

## 2019-10-20 DIAGNOSIS — I10 Essential (primary) hypertension: Secondary | ICD-10-CM

## 2019-10-20 DIAGNOSIS — R6521 Severe sepsis with septic shock: Secondary | ICD-10-CM

## 2019-10-20 DIAGNOSIS — B951 Streptococcus, group B, as the cause of diseases classified elsewhere: Secondary | ICD-10-CM

## 2019-10-20 DIAGNOSIS — A408 Other streptococcal sepsis: Secondary | ICD-10-CM

## 2019-10-20 DIAGNOSIS — R7881 Bacteremia: Secondary | ICD-10-CM

## 2019-10-20 DIAGNOSIS — E876 Hypokalemia: Secondary | ICD-10-CM

## 2019-10-20 DIAGNOSIS — N179 Acute kidney failure, unspecified: Secondary | ICD-10-CM

## 2019-10-20 LAB — ECHOCARDIOGRAM COMPLETE
Height: 66 in
Weight: 4691.39 oz

## 2019-10-20 LAB — CULTURE, BLOOD (ROUTINE X 2): Special Requests: ADEQUATE

## 2019-10-20 LAB — BASIC METABOLIC PANEL
Anion gap: 9 (ref 5–15)
BUN: 12 mg/dL (ref 8–23)
CO2: 27 mmol/L (ref 22–32)
Calcium: 8.4 mg/dL — ABNORMAL LOW (ref 8.9–10.3)
Chloride: 105 mmol/L (ref 98–111)
Creatinine, Ser: 1.04 mg/dL — ABNORMAL HIGH (ref 0.44–1.00)
GFR calc Af Amer: >60 mL/min (ref >60)
GFR calc non Af Amer: 55 mL/min — ABNORMAL LOW (ref >60)
Glucose, Bld: 91 mg/dL (ref 70–99)
Potassium: 3.1 mmol/L — ABNORMAL LOW (ref 3.5–5.1)
Sodium: 141 mmol/L (ref 135–145)

## 2019-10-20 LAB — CBC
HCT: 30.2 % — ABNORMAL LOW (ref 36.0–46.0)
Hemoglobin: 10.2 g/dL — ABNORMAL LOW (ref 12.0–15.0)
MCH: 28.3 pg (ref 26.0–34.0)
MCHC: 33.8 g/dL (ref 30.0–36.0)
MCV: 83.9 fL (ref 80.0–100.0)
Platelets: 133 10*3/uL — ABNORMAL LOW (ref 150–400)
RBC: 3.6 MIL/uL — ABNORMAL LOW (ref 3.87–5.11)
RDW: 15 % (ref 11.5–15.5)
WBC: 5.9 10*3/uL (ref 4.0–10.5)
nRBC: 0 % (ref 0.0–0.2)

## 2019-10-20 MED ORDER — MOMETASONE FURO-FORMOTEROL FUM 200-5 MCG/ACT IN AERO
2.0000 | INHALATION_SPRAY | Freq: Two times a day (BID) | RESPIRATORY_TRACT | Status: DC
  Administered 2019-10-20 – 2019-10-22 (×5): 2 via RESPIRATORY_TRACT
  Filled 2019-10-20: qty 8.8

## 2019-10-20 MED ORDER — POTASSIUM CHLORIDE 20 MEQ PO PACK
40.0000 meq | PACK | Freq: Once | ORAL | Status: AC
  Administered 2019-10-20: 40 meq via ORAL
  Filled 2019-10-20: qty 2

## 2019-10-20 MED ORDER — MAGNESIUM OXIDE 400 (241.3 MG) MG PO TABS
400.0000 mg | ORAL_TABLET | Freq: Every day | ORAL | Status: DC
  Administered 2019-10-20 – 2019-10-22 (×3): 400 mg via ORAL
  Filled 2019-10-20 (×3): qty 1

## 2019-10-20 MED ORDER — LOPERAMIDE HCL 2 MG PO CAPS: 2.0000 mg | ORAL_CAPSULE | Freq: Three times a day (TID) | ORAL | Status: DC | PRN

## 2019-10-20 NOTE — Progress Notes (Signed)
*  PRELIMINARY RESULTS* Echocardiogram 2D Echocardiogram has been performed.  [name] 10/20/2019, 8:58 AM

## 2019-10-20 NOTE — Progress Notes (Signed)
Patient ID: [name], female   DOB: [birth year], 69 y.o.   MRN: [id number] [facility] PROGRESS NOTE  [name] RSW:[id number] DOB: 01-19-1951 DOA: 10/17/2019 PCP: Kirk Ruths, MD  HPI/Subjective: Patient admitted with fever and chills.  Patient feeling better.  No abdominal pain.  Appetite okay.  Objective: Vitals:   10/20/19 0909 10/20/19 1203  BP: (!) 118/59 130/79  Pulse: 75 68  Resp:  18  Temp: 97.6 F (36.4 C) 98.6 F (37 C)  SpO2: 94% 96%    Intake/Output Summary (Last 24 hours) at 10/20/2019 1718 Last data filed at 10/20/2019 1502 Gross per 24 hour  Intake 720 ml  Output 2800 ml  Net -2080 ml   Filed Weights   10/17/19 1529  Weight: 133 kg    ROS: Review of Systems  Respiratory: Negative for cough and shortness of breath.   Cardiovascular: Negative for chest pain.  Gastrointestinal: Positive for diarrhea. Negative for abdominal pain, nausea and vomiting.   Exam: Physical Exam  Constitutional: She is oriented to person, place, and time.  HENT:  Nose: No mucosal edema.  Mouth/Throat: No oropharyngeal exudate.  Eyes: Pupils are equal, round, and reactive to light. Lids are normal.  Cardiovascular: Normal rate, S1 normal, S2 normal and normal heart sounds.  No murmur heard. Respiratory: She has no decreased breath sounds. She has no wheezes. She has no rhonchi. She has no rales.  GI: Soft. There is no abdominal tenderness.  Musculoskeletal:     Right ankle: Swelling present.     Left ankle: Swelling present.  Neurological: She is alert and oriented to person, place, and time. No cranial nerve deficit.      Data Reviewed: Basic Metabolic Panel: Recent Labs  Lab 10/17/19 1636 10/18/19 0703 10/19/19 0604 10/20/19 0426  NA 132* 134* 139 141  K 4.6 3.7 3.3* 3.1*  CL 95* 98 105 105  CO2 27 26 28 27   GLUCOSE 148* 110* 97 91  BUN 18 16 14 12   CREATININE 1.33* 1.34* 1.13* 1.04*  CALCIUM 9.4 8.5* 8.9 8.4*   Liver Function  Tests: Recent Labs  Lab 10/17/19 1636 10/18/19 0703  AST 26 21  ALT 19 16  ALKPHOS 79 56  BILITOT 0.9 0.7  PROT 8.0 6.6  ALBUMIN 4.1 3.3*   CBC: Recent Labs  Lab 10/17/19 1553 10/18/19 0703 10/19/19 0604 10/20/19 0426  WBC 10.8* 10.7* 8.2 5.9  NEUTROABS 9.5*  --   --   --   HGB 12.9 10.7* 11.5* 10.2*  HCT 38.3 32.0* 34.5* 30.2*  MCV 84.4 85.6 85.6 83.9  PLT 136* 142* 131* 133*     Recent Results (from the past 240 hour(s))  Urine culture     Status: Abnormal   Collection Time: 10/17/19  3:53 PM   Specimen: Urine, Random  Result Value Ref Range Status   Specimen Description   Final    URINE, RANDOM Performed at [facility], [address] [place], [place] [address]    Special Requests   Final    NONE Performed at [facility], [address] [place], [place] [address]    Culture (A)  Final    >=100,000 COLONIES/mL MULTIPLE SPECIES PRESENT, SUGGEST RECOLLECTION   Report Status 10/19/2019 FINAL  Final  Blood culture (routine x 2)     Status: Abnormal   Collection Time: 10/17/19  3:54 PM   Specimen: BLOOD  Result Value Ref Range Status   Specimen Description   Final  BLOOD RIGHT ANTECUBITAL Performed at [facility], [address] [place], [place] [address]    Special Requests   Final    BOTTLES DRAWN AEROBIC AND ANAEROBIC Blood Culture results may not be optimal due to an inadequate volume of blood received in culture bottles Performed at [facility], [address] [place], [place] [address]    Culture  Setup Time   Final    ANAEROBIC BOTTLE ONLY GRAM POSITIVE COCCI CRITICAL RESULT CALLED TO, READ BACK BY AND VERIFIED WITH: DAVID BESANTI AT 0425 ON 10/18/19 RWW Performed at [facility], [place] [address] [place], [place] [address]    Culture GROUP B STREP(S.AGALACTIAE)ISOLATED (A)  Final   Report Status 10/20/2019 FINAL  Final   Organism ID, Bacteria GROUP B STREP(S.AGALACTIAE)ISOLATED  Final       Susceptibility   Group b strep(s.agalactiae)isolated - MIC*    CLINDAMYCIN >=1 RESISTANT Resistant     AMPICILLIN <=0.25 SENSITIVE Sensitive     ERYTHROMYCIN >=8 RESISTANT Resistant     VANCOMYCIN 0.5 SENSITIVE Sensitive     CEFTRIAXONE <=0.12 SENSITIVE Sensitive     LEVOFLOXACIN 1 SENSITIVE Sensitive     * GROUP B STREP(S.AGALACTIAE)ISOLATED  Blood Culture ID Panel (Reflexed)     Status: Abnormal   Collection Time: 10/17/19  3:54 PM  Result Value Ref Range Status   Enterococcus species NOT DETECTED NOT DETECTED Final   Listeria monocytogenes NOT DETECTED NOT DETECTED Final   Staphylococcus species NOT DETECTED NOT DETECTED Final   Staphylococcus aureus (BCID) NOT DETECTED NOT DETECTED Final   Streptococcus species DETECTED (A) NOT DETECTED Final    Comment: CRITICAL RESULT CALLED TO, READ BACK BY AND VERIFIED WITH: DAVID BESANTI AT 0425 ON 10/18/19 RWW    Streptococcus agalactiae DETECTED (A) NOT DETECTED Final    Comment: CRITICAL RESULT CALLED TO, READ BACK BY AND VERIFIED WITH: DAVID BESANTI AT 0425 ON 10/18/19 RWW    Streptococcus pneumoniae NOT DETECTED NOT DETECTED Final   Streptococcus pyogenes NOT DETECTED NOT DETECTED Final   Acinetobacter baumannii NOT DETECTED NOT DETECTED Final   Enterobacteriaceae species NOT DETECTED NOT DETECTED Final   Enterobacter cloacae complex NOT DETECTED NOT DETECTED Final   Escherichia coli NOT DETECTED NOT DETECTED Final   Klebsiella oxytoca NOT DETECTED NOT DETECTED Final   Klebsiella pneumoniae NOT DETECTED NOT DETECTED Final   Proteus species NOT DETECTED NOT DETECTED Final   Serratia marcescens NOT DETECTED NOT DETECTED Final   Haemophilus influenzae NOT DETECTED NOT DETECTED Final   Neisseria meningitidis NOT DETECTED NOT DETECTED Final   Pseudomonas aeruginosa NOT DETECTED NOT DETECTED Final   Candida albicans NOT DETECTED NOT DETECTED Final   Candida glabrata NOT DETECTED NOT DETECTED Final   Candida krusei NOT DETECTED NOT  DETECTED Final   Candida parapsilosis NOT DETECTED NOT DETECTED Final   Candida tropicalis NOT DETECTED NOT DETECTED Final    Comment: Performed at [facility], [address] [place], [place] [address]  Blood culture (routine x 2)     Status: Abnormal   Collection Time: 10/17/19  4:36 PM   Specimen: BLOOD  Result Value Ref Range Status   Specimen Description   Final    BLOOD BLOOD LEFT ARM Performed at [facility], [address], [place], [place] [address]    Special Requests   Final    BOTTLES DRAWN AEROBIC AND ANAEROBIC Blood Culture adequate volume Performed at [facility], [address], [place], [place] [address]  Culture  Setup Time   Final    GRAM POSITIVE COCCI IN BOTH AEROBIC AND ANAEROBIC BOTTLES CRITICAL RESULT CALLED TO, READ BACK BY AND VERIFIED WITH: [place] ON 10/18/19 RWW Performed at [facility], [address] [place], [place] [address]    Culture (A)  Final    GROUP B STREP(S.AGALACTIAE)ISOLATED SUSCEPTIBILITIES PERFORMED ON PREVIOUS CULTURE WITHIN THE LAST 5 DAYS. Performed at [facility], [place] [address], [name], [place] [address]    Report Status 10/20/2019 FINAL  Final  SARS Coronavirus 2 by RT PCR (hospital order, performed in [facility] hospital lab) Nasopharyngeal Nasopharyngeal Swab     Status: None   Collection Time: 10/17/19  7:22 PM   Specimen: Nasopharyngeal Swab  Result Value Ref Range Status   SARS Coronavirus 2 NEGATIVE NEGATIVE Final    Comment: (NOTE) SARS-CoV-2 target nucleic acids are NOT DETECTED.  The SARS-CoV-2 RNA is generally detectable in upper and lower respiratory specimens during the acute phase of infection. The lowest concentration of SARS-CoV-2 viral copies this assay can detect is 250 copies / mL. A negative result does not preclude SARS-CoV-2 infection and should not be used as the sole basis for treatment or other patient management decisions.  A  negative result may occur with improper specimen collection / handling, submission of specimen other than nasopharyngeal swab, presence of viral mutation(s) within the areas targeted by this assay, and inadequate number of viral copies (<250 copies / mL). A negative result must be combined with clinical observations, patient history, and epidemiological information.  Fact Sheet for Patients:   StrictlyIdeas.no  Fact Sheet for Healthcare Providers: BankingDealers.co.za  This test is not yet approved or  cleared by the [place] FDA and has been authorized for detection and/or diagnosis of SARS-CoV-2 by FDA under an Emergency Use Authorization (EUA).  This EUA will remain in effect (meaning this test can be used) for the duration of the COVID-19 declaration under Section 564(b)(1) of the Act, 21 U.S.C. section 360bbb-3(b)(1), unless the authorization is terminated or revoked sooner.  Performed at [facility], [address] [place], [place] [address]      Studies: ECHOCARDIOGRAM COMPLETE  Result Date: 10/20/2019    ECHOCARDIOGRAM REPORT   Patient Name:   BRINLYNN GORTON [facility] Date of Exam: 10/20/2019 Medical Rec #:  [id number]     Height:       66.0 in Accession #:    [id number]    Weight:       293.2 lb Date of Birth:  1950/07/12      BSA:          2.353 m Patient Age:    36 years      BP:           134/65 mmHg Patient Gender: F             HR:           61 bpm. Exam Location:  [facility] Procedure: 2D Echo, Cardiac Doppler and Color Doppler Indications:     Bacteremia 790.7  History:         Patient has prior history of Echocardiogram examinations, most                  recent 01/06/2019. COPD; Risk Factors:Hypertension. PVD.  Sonographer:     Sherrie Sport RDCS (AE) Referring Phys:  [id number] Loletha Grayer Diagnosing Phys: Nelva Bush MD IMPRESSIONS  1. Left ventricular ejection fraction, by estimation,  is 55 to 60%. The left ventricle has  normal function. The left ventricle has no regional wall motion abnormalities. There is moderate asymmetric left ventricular hypertrophy of the septal segment. Left ventricular diastolic parameters are consistent with Grade I diastolic dysfunction (impaired relaxation). Elevated left atrial pressure.  2. Right ventricular systolic function is normal. The right ventricular size is normal. Tricuspid regurgitation signal is inadequate for assessing PA pressure.  3. The mitral valve was not well visualized. Trivial mitral valve regurgitation. No evidence of mitral stenosis.  4. The aortic valve was not well visualized. Aortic valve regurgitation is not visualized. No aortic stenosis is present. FINDINGS  Left Ventricle: Left ventricular ejection fraction, by estimation, is 55 to 60%. The left ventricle has normal function. The left ventricle has no regional wall motion abnormalities. The left ventricular internal cavity size was normal in size. There is  moderate asymmetric left ventricular hypertrophy of the septal segment. Left ventricular diastolic parameters are consistent with Grade I diastolic dysfunction (impaired relaxation). Elevated left atrial pressure. Right Ventricle: The right ventricular size is normal. Right vetricular wall thickness was not assessed. Right ventricular systolic function is normal. Tricuspid regurgitation signal is inadequate for assessing PA pressure. Left Atrium: Left atrial size was normal in size. Right Atrium: Right atrial size was normal in size. Pericardium: The pericardium was not well visualized. Mitral Valve: The mitral valve was not well visualized. Mild mitral annular calcification. Trivial mitral valve regurgitation. No evidence of mitral valve stenosis. Tricuspid Valve: The tricuspid valve is not well visualized. Tricuspid valve regurgitation is trivial. Aortic Valve: The aortic valve was not well visualized. Aortic valve regurgitation is not visualized. No aortic stenosis  is present. Aortic valve mean gradient measures 4.5 mmHg. Aortic valve peak gradient measures 8.1 mmHg. Aortic valve area, by VTI measures 2.59 cm. Pulmonic Valve: The pulmonic valve was not well visualized. Pulmonic valve regurgitation is not visualized. No evidence of pulmonic stenosis. Aorta: The aortic root is normal in size and structure. Pulmonary Artery: The pulmonary artery is not well seen. Venous: The inferior vena cava was not well visualized. IAS/Shunts: The interatrial septum was not well visualized.  LEFT VENTRICLE PLAX 2D LVIDd:         3.59 cm  Diastology LVIDs:         2.59 cm  LV e' lateral:   9.79 cm/s LV PW:         1.08 cm  LV E/e' lateral: 10.7 LV IVS:        1.08 cm  LV e' medial:    5.66 cm/s LVOT diam:     2.00 cm  LV E/e' medial:  18.6 LV SV:         88 LV SV Index:   37 LVOT Area:     3.14 cm  RIGHT VENTRICLE RV Basal diam:  3.20 cm RV S prime:     14.80 cm/s TAPSE (M-mode): 2.7 cm LEFT ATRIUM             Index       RIGHT ATRIUM           Index LA diam:        4.00 cm 1.70 cm/m  RA Area:     12.70 cm LA Vol (A2C):   49.4 ml 21.00 ml/m RA Volume:   28.20 ml  11.99 ml/m LA Vol (A4C):   28.4 ml 12.07 ml/m LA Biplane Vol: 40.8 ml 17.34 ml/m  AORTIC VALVE  PULMONIC VALVE AV Area (Vmax):    2.68 cm     PV Vmax:        0.81 m/s AV Area (Vmean):   2.41 cm     PV Peak grad:   2.6 mmHg AV Area (VTI):     2.59 cm     RVOT Peak grad: 3 mmHg AV Vmax:           142.00 cm/s AV Vmean:          104.800 cm/s AV VTI:            0.338 m AV Peak Grad:      8.1 mmHg AV Mean Grad:      4.5 mmHg LVOT Vmax:         121.00 cm/s LVOT Vmean:        80.500 cm/s LVOT VTI:          0.279 m LVOT/AV VTI ratio: 0.82  AORTA Ao Root diam: 3.10 cm MITRAL VALVE MV Area (PHT): 2.62 cm     SHUNTS MV Decel Time: 289 msec     Systemic VTI:  0.28 m MV E velocity: 105.00 cm/s  Systemic Diam: 2.00 cm MV A velocity: 123.00 cm/s MV E/A ratio:  0.85 Harrell Gave End MD Electronically signed by Nelva Bush MD Signature Date/Time: 10/20/2019/1:23:03 PM    Final     Scheduled Meds: . amLODipine  5 mg Oral Daily  . atorvastatin  10 mg Oral Daily  . buPROPion  300 mg Oral Daily  . Chlorhexidine Gluconate Cloth  6 each Topical Daily  . DULoxetine  60 mg Oral Daily  . enoxaparin (LOVENOX) injection  40 mg Subcutaneous Q12H  . famotidine  20 mg Oral Daily  . gabapentin  600 mg Oral TID  . magnesium oxide  400 mg Oral Daily  . melatonin  5 mg Oral QHS  . mometasone-formoterol  2 puff Inhalation BID  . QUEtiapine  150 mg Oral QHS  . tiZANidine  4 mg Oral TID   Continuous Infusions: . ampicillin-sulbactam (UNASYN) IV 3 g (10/20/19 1505)    Assessment/Plan:  1. Sepsis present on admission with acute kidney injury.  Group B strep and blood cultures..  Suspected urine source.  Patient's suprapubic catheter was changed in the ED.  Continue Unasyn.  Will get ID consultation.  Repeat blood cultures today.  Echocardiogram does not show any signs of endocarditis.  2. Patient had cellulitis of the left lower extremity on Unasyn 3. Hypokalemia.  Replace orally 4. Acute kidney injury on chronic kidney disease stage IIIa 5. Hyperlipidemia on atorvastatin 6. Essential hypertension on amlodipine 7. Neuropathy on gabapentin 8. Weakness.  Physical therapy recommends rehab.   Code Status:     Code Status Orders  (From admission, onward)         Start     Ordered   10/17/19 1928  Full code  Continuous        10/17/19 1927        Code Status History    Date Active Date Inactive Code Status Order ID Comments User Context   06/20/2019 1654 06/23/2019 0206 Full Code [id number]  Para Skeans, MD ED   04/20/2019 2116 04/24/2019 1710 Full Code [id number]  Mansy, Arvella Merles, MD ED   02/02/2019 1553 02/09/2019 2027 Full Code [id number]  [name], MD ED   01/05/2019 0647 01/08/2019 2202 Full Code [id number]  Harrie Foreman, MD ED   06/11/2018 2158 06/14/2018 1807 Full  Code [id number]  Saundra Shelling, MD  Inpatient   07/07/2017 2000 07/08/2017 1631 Full Code [id number]  Dustin Flock, MD Inpatient   05/14/2017 0152 05/15/2017 1953 Full Code [id number]  Lance Coon, MD Inpatient   04/01/2017 1723 04/05/2017 1742 Full Code [id number]  Daylene Posey, RN Inpatient   02/15/2017 1715 02/17/2017 2008 Full Code [id number]  Henreitta Leber, MD Inpatient   02/06/2017 2356 02/09/2017 1858 Full Code [id number]  Dustin Flock, MD Inpatient   01/02/2017 1903 01/06/2017 1848 Full Code [id number]  [name], MD ED   12/29/2016 1252 12/30/2016 1722 Full Code [id number]  Nicholes Mango, MD Inpatient   10/11/2016 2320 10/14/2016 1726 Full Code [id number]  Theodoro Grist, MD Inpatient   08/28/2016 1159 08/30/2016 1932 Full Code [id number]  Loletha Grayer, MD ED   12/18/2015 1956 12/19/2015 1939 Full Code [id number]  Hower, Aaron Mose, MD ED   Advance Care Planning Activity     Family Communication: Left message for husband Disposition Plan: Status is: Inpatient  Dispo: The patient is from: Home              Anticipated d/c is to: Rehab              Anticipated d/c date is: 10/22/2019              Patient currently being treated with IV antibiotics for group B streptococcus sepsis with acute kidney injury.  Still requires IV antibiotics at this time.  Consultants:  Infectious disease  Antibiotics:  Unasyn  Time spent: 28 minutes  [place]

## 2019-10-20 NOTE — Consult Note (Addendum)
NAME: [name]  DOB: [birth year]  MRN: [id number]  Date/Time: 10/20/2019 3:24 PM  REQUESTING PROVIDER: Dr. Leslye Peer Subjective:  REASON FOR CONSULT: Group B streptococcus bacteremia ? [name] is a 69 y.o. female known to me from previous admissions with history of multiple sclerosis, edema legs, history of cellulitis and group B streptococcus bacteremia in September 2020 due to cellulitis, follicular lymphoma treated with rituximab and in remission, hypertension, chronic Foley due to neurogenic bladder presents to the ED with weakness, fever of 2 days duration.  She also was little confused. She was recently given Keflex for possible UTI.  In the ED temperature was 103.3, blood pressure 160/77, pulse rate of 84 and respiratory rate of 18.  Labs revealed a sodium of 132, glucose of 148, creatinine of 1.3, lactate of 2.4 and white count of 10.8 and platelet of 136.  Blood culture was sent.  Urine analysis showed no WBC, no bacteria.  It had leukoesterase and nitrites and so she he she was admitted as sepsis due to UTI.  Was started on IV cefepime which was changed to IV ceftriaxone.  The blood culture came back positive for group B streptococcus.  It was later identified that the patient had cellulitis of the left lower extremity secondary to multiple scratches from her cat. Patient states she is doing better She states she has a 54-month-old cat from the shelter. The cat did not bite her . She has multiple scratches.  She does not complain of any pain under arms or swollen lymph nodes.   Past Medical History:  Diagnosis Date  . Abdominal aortic atherosclerosis ([place]) 11/11/2016  . ADHD   . Anxiety   . COPD (chronic obstructive pulmonary disease) ([place])   . Depression    major depressive  . Dyspnea    doe  . Edema    left leg  . Follicular lymphoma ([place])    B Cell  . Follicular lymphoma grade II ([place])   . Hypertension   . Hypotension    idiopathic  . Kyphoscoliosis and scoliosis  11/26/2011  . Morbid obesity ([place]) 01/05/2016  . Multiple sclerosis ([place])   . Multiple sclerosis ([place])    1980's  . Neuromuscular disorder ([place])   . Obstructive and reflux uropathy    foley  . Pain    atypical facial  . Peripheral vascular disease of lower extremity with ulceration ([place]) 11/08/2015  . Skin ulcer ([place]) 11/08/2015  . Weakness    generalized. has MS    Past Surgical History:  Procedure Laterality Date  . BACK SURGERY N/A 2002  . CYST EXCISION     lower back  . INGUINAL LYMPH NODE BIOPSY Left 07/04/2016   Procedure: INGUINAL LYMPH NODE BIOPSY;  Surgeon: Christene Lye, MD;  Location: [facility] ORS;  Service: General;  Laterality: Left;  . PORTACATH PLACEMENT N/A 07/22/2016   Procedure: INSERTION PORT-A-CATH;  Surgeon: Christene Lye, MD;  Location: [facility] ORS;  Service: General;  Laterality: N/A;  . TONSILLECTOMY AND ADENOIDECTOMY    . TUBAL LIGATION      Social History   Socioeconomic History  . Marital status: Married    Spouse name: Not on file  . Number of children: 3  . Years of education: Not on file  . Highest education level: Not on file  Occupational History  . Occupation: disabled  Tobacco Use  . Smoking status: Former Smoker    Packs/day: 1.00    Years: 20.00    Pack  years: 20.00    Types: Cigarettes    Start date: 04/30/1995    Quit date: 02/03/2016    Years since quitting: 3.7  . Smokeless tobacco: Never Used  Vaping Use  . Vaping Use: Some days  Substance and Sexual Activity  . Alcohol use: No    Alcohol/week: 0.0 standard drinks  . Drug use: Yes    Types: Marijuana    Comment: smokes THC occasionally per pt   . Sexual activity: Not Currently    Birth control/protection: None  Other Topics Concern  . Not on file  Social History Narrative   She is married.    Social Determinants of Health   Financial Resource Strain:   . Difficulty of Paying Living Expenses:   Food Insecurity:   . Worried About Charity fundraiser in the Last  Year:   . Arboriculturist in the Last Year:   Transportation Needs:   . Film/video editor (Medical):   Marland Kitchen Lack of Transportation (Non-Medical):   Physical Activity:   . Days of Exercise per Week:   . Minutes of Exercise per Session:   Stress:   . Feeling of Stress :   Social Connections:   . Frequency of Communication with Friends and Family:   . Frequency of Social Gatherings with Friends and Family:   . Attends Religious Services:   . Active Member of Clubs or Organizations:   . Attends Archivist Meetings:   Marland Kitchen Marital Status:   Intimate Partner Violence:   . Fear of Current or Ex-Partner:   . Emotionally Abused:   Marland Kitchen Physically Abused:   . Sexually Abused:     Family History  Problem Relation Age of Onset  . COPD Mother   . Diabetes Mother   . Heart failure Mother   . Alcohol abuse Father   . Kidney disease Father   . Kidney failure Father   . Arthritis Sister   . CAD Maternal Grandmother   . Stroke Maternal Grandfather   . Arthritis Sister   . Mental illness Sister   . Arthritis Brother    No Known Allergies  Current Facility-Administered Medications  Medication Dose Route Frequency Provider Last Rate Last Admin  . acetaminophen (TYLENOL) tablet 650 mg  650 mg Oral Q6H PRN Elwyn Reach, MD   650 mg at 10/20/19 0911   Or  . acetaminophen (TYLENOL) suppository 650 mg  650 mg Rectal Q6H PRN Elwyn Reach, MD      . amLODipine (NORVASC) tablet 5 mg  5 mg Oral Daily Wyvonnia Dusky, MD   5 mg at 10/20/19 0912  . Ampicillin-Sulbactam (UNASYN) 3 g in sodium chloride 0.9 % 100 mL IVPB  3 g Intravenous Q6H Wyvonnia Dusky, MD 200 mL/hr at 10/20/19 1505 3 g at 10/20/19 1505  . atorvastatin (LIPITOR) tablet 10 mg  10 mg Oral Daily Wyvonnia Dusky, MD   10 mg at 10/20/19 0912  . buPROPion (WELLBUTRIN XL) 24 hr tablet 300 mg  300 mg Oral Daily Wyvonnia Dusky, MD   300 mg at 10/20/19 0910  . Chlorhexidine Gluconate Cloth 2 % PADS 6 each  6  each Topical Daily Lorella Nimrod, MD   6 each at 10/20/19 0914  . clonazePAM (KLONOPIN) tablet 0.5 mg  0.5 mg Oral QHS PRN Wyvonnia Dusky, MD      . DULoxetine (CYMBALTA) DR capsule 60 mg  60 mg Oral Daily Eppie Gibson M,  MD   60 mg at 10/20/19 0911  . enoxaparin (LOVENOX) injection 40 mg  40 mg Subcutaneous Q12H Gerald Dexter, RPH   40 mg at 10/20/19 0160  . famotidine (PEPCID) tablet 20 mg  20 mg Oral Daily Sharion Settler, NP   20 mg at 10/20/19 0910  . gabapentin (NEURONTIN) capsule 600 mg  600 mg Oral TID Wyvonnia Dusky, MD   600 mg at 10/20/19 1505  . ipratropium-albuterol (DUONEB) 0.5-2.5 (3) MG/3ML nebulizer solution 3 mL  3 mL Nebulization Q4H PRN Wyvonnia Dusky, MD      . magnesium oxide (MAG-OX) tablet 400 mg  400 mg Oral Daily Loletha Grayer, MD   400 mg at 10/20/19 1505  . melatonin tablet 5 mg  5 mg Oral QHS Sharion Settler, NP   5 mg at 10/19/19 2138  . mometasone-formoterol (DULERA) 200-5 MCG/ACT inhaler 2 puff  2 puff Inhalation BID Loletha Grayer, MD   2 puff at 10/20/19 1458  . ondansetron (ZOFRAN) tablet 4 mg  4 mg Oral Q6H PRN Elwyn Reach, MD       Or  . ondansetron (ZOFRAN) injection 4 mg  4 mg Intravenous Q6H PRN Gala Romney L, MD      . QUEtiapine (SEROQUEL) tablet 150 mg  150 mg Oral QHS Wyvonnia Dusky, MD   150 mg at 10/19/19 2138  . tiZANidine (ZANAFLEX) tablet 4 mg  4 mg Oral TID Wyvonnia Dusky, MD   4 mg at 10/20/19 0911   Facility-Administered Medications Ordered in Other Encounters  Medication Dose Route Frequency Provider Last Rate Last Admin  . heparin lock flush 100 unit/mL  500 Units Intracatheter Once PRN Sindy Guadeloupe, MD         Abtx:  Anti-infectives (From admission, onward)   Start     Dose/Rate Route Frequency Ordered Stop   10/18/19 1600  Ampicillin-Sulbactam (UNASYN) 3 g in sodium chloride 0.9 % 100 mL IVPB     Discontinue     3 g 200 mL/hr over 30 Minutes Intravenous Every 6 hours 10/18/19 1455      10/18/19 1445  amoxicillin-clavulanate (AUGMENTIN) 875-125 MG per tablet 1 tablet  Status:  Discontinued        1 tablet Oral Every 12 hours 10/18/19 1433 10/18/19 1455   10/18/19 1400  ceFAZolin (ANCEF) IVPB 2g/100 mL premix  Status:  Discontinued        2 g 200 mL/hr over 30 Minutes Intravenous Every 8 hours 10/18/19 0555 10/18/19 1455   10/18/19 0545  ceFAZolin (ANCEF) IVPB 2g/100 mL premix  Status:  Discontinued        2 g 200 mL/hr over 30 Minutes Intravenous Every 12 hours 10/18/19 0537 10/18/19 0555   10/17/19 1930  cefTRIAXone (ROCEPHIN) 1 g in sodium chloride 0.9 % 100 mL IVPB  Status:  Discontinued        1 g 200 mL/hr over 30 Minutes Intravenous Every 24 hours 10/17/19 1927 10/18/19 0537   10/17/19 1600  ceFEPIme (MAXIPIME) 2 g in sodium chloride 0.9 % 100 mL IVPB        2 g 200 mL/hr over 30 Minutes Intravenous  Once 10/17/19 1545 10/17/19 1831      REVIEW OF SYSTEMS:  Const: fever,  chills, negative weight loss Eyes: negative diplopia or visual changes, negative eye pain ENT: negative coryza, negative sore throat Resp: negative cough, hemoptysis, dyspnea Cards: negative for chest pain, palpitations, has lower extremity edema GU: Chronic  Foley catheter GI: Negative for abdominal pain, diarrhea, bleeding, constipation Skin: Multiple scratches from her cat Heme: negative for easy bruising and gum/nose bleeding MS: Generalized weakness and poor mobility Neurolo:negative for headaches, dizziness, vertigo, memory problems  Psych: negative for feelings of anxiety, depression  Endocrine: negative for thyroid, diabetes Allergy/Immunology-no known drug allergy Objective:  VITALS:  BP 130/79 (BP Location: Left Arm)   Pulse 68   Temp 98.6 F (37 C) (Oral)   Resp 18   Ht 5\' 6"  (1.676 m)   Wt 133 kg   SpO2 96%   BMI 47.33 kg/m  PHYSICAL EXAM:  General: Alert, cooperative, no distress, appears stated age.  Head: Normocephalic, without obvious abnormality,  atraumatic. Eyes: Conjunctivae clear, anicteric sclerae. Pupils are equal ENT Nares normal. No drainage or sinus tenderness. Lips, mucosa, and tongue normal. No Thrush.  Poor dentition Neck: Supple, symmetrical, no adenopathy, thyroid: non tender no carotid bruit and no JVD. Back: No CVA tenderness. Lungs: Bilateral air entry Heart: Regular rate and rhythm, no murmur, rub or gallop. Abdomen: Soft, non-tender,not distended. Bowel sounds normal. No masses Extremities: Multiple scratches on her right arm and right leg       Skin: As above  lymph: Cervical, supraclavicular normal.  No axillary lymph nodes or inguinal lymph nodes Neurologic: Did not examine in detail Pertinent Labs Lab Results CBC    Component Value Date/Time   WBC 5.9 10/20/2019 0426   RBC 3.60 (L) 10/20/2019 0426   HGB 10.2 (L) 10/20/2019 0426   HGB 13.6 08/01/2013 2312   HCT 30.2 (L) 10/20/2019 0426   HCT 40.2 08/01/2013 2312   PLT 133 (L) 10/20/2019 0426   PLT 274 08/01/2013 2312   MCV 83.9 10/20/2019 0426   MCV 87 08/01/2013 2312   MCH 28.3 10/20/2019 0426   MCHC 33.8 10/20/2019 0426   RDW 15.0 10/20/2019 0426   RDW 14.2 08/01/2013 2312   LYMPHSABS 0.4 (L) 10/17/2019 1553   LYMPHSABS 1.5 07/14/2013 1444   MONOABS 0.7 10/17/2019 1553   MONOABS 0.7 07/14/2013 1444   EOSABS 0.1 10/17/2019 1553   EOSABS 0.2 07/14/2013 1444   BASOSABS 0.1 10/17/2019 1553   BASOSABS 0.1 07/14/2013 1444    CMP Latest Ref Rng & Units 10/20/2019 10/19/2019 10/18/2019  Glucose 70 - 99 mg/dL 91 97 110(H)  BUN 8 - 23 mg/dL 12 14 16   Creatinine 0.44 - 1.00 mg/dL 1.04(H) 1.13(H) 1.34(H)  Sodium 135 - 145 mmol/L 141 139 134(L)  Potassium 3.5 - 5.1 mmol/L 3.1(L) 3.3(L) 3.7  Chloride 98 - 111 mmol/L 105 105 98  CO2 22 - 32 mmol/L 27 28 26   Calcium 8.9 - 10.3 mg/dL 8.4(L) 8.9 8.5(L)  Total Protein 6.5 - 8.1 g/dL - - 6.6  Total Bilirubin 0.3 - 1.2 mg/dL - - 0.7  Alkaline Phos 38 - 126 U/L - - 56  AST 15 - 41 U/L - - 21  ALT 0  - 44 U/L - - 16      Microbiology: Recent Results (from the past 240 hour(s))  Urine culture     Status: Abnormal   Collection Time: 10/17/19  3:53 PM   Specimen: Urine, Random  Result Value Ref Range Status   Specimen Description   Final    URINE, RANDOM Performed at [name], [address] [place] [address]    Special Requests   Final    NONE Performed at [facility], [address], [place], [place] [address]  Culture (A)  Final    >=100,000 COLONIES/mL MULTIPLE SPECIES PRESENT, SUGGEST RECOLLECTION   Report Status 10/19/2019 FINAL  Final  Blood culture (routine x 2)     Status: Abnormal   Collection Time: 10/17/19  3:54 PM   Specimen: BLOOD  Result Value Ref Range Status   Specimen Description   Final    BLOOD RIGHT ANTECUBITAL Performed at [facility], [address] [place], [place] [address]    Special Requests   Final    BOTTLES DRAWN AEROBIC AND ANAEROBIC Blood Culture results may not be optimal due to an inadequate volume of blood received in culture bottles Performed at [facility], [address] [place], [place] [address]    Culture  Setup Time   Final    ANAEROBIC BOTTLE ONLY GRAM POSITIVE COCCI CRITICAL RESULT CALLED TO, READ BACK BY AND VERIFIED WITH: DAVID BESANTI AT 0425 ON 10/18/19 RWW Performed at [facility], [place] [address], [place], [place] [address]    Culture GROUP B STREP(S.AGALACTIAE)ISOLATED (A)  Final   Report Status 10/20/2019 FINAL  Final   Organism ID, Bacteria GROUP B STREP(S.AGALACTIAE)ISOLATED  Final      Susceptibility   Group b strep(s.agalactiae)isolated - MIC*    CLINDAMYCIN >=1 RESISTANT Resistant     AMPICILLIN <=0.25 SENSITIVE Sensitive     ERYTHROMYCIN >=8 RESISTANT Resistant     VANCOMYCIN 0.5 SENSITIVE Sensitive     CEFTRIAXONE <=0.12 SENSITIVE Sensitive     LEVOFLOXACIN 1 SENSITIVE Sensitive     * GROUP B STREP(S.AGALACTIAE)ISOLATED  Blood Culture ID  Panel (Reflexed)     Status: Abnormal   Collection Time: 10/17/19  3:54 PM  Result Value Ref Range Status   Enterococcus species NOT DETECTED NOT DETECTED Final   Listeria monocytogenes NOT DETECTED NOT DETECTED Final   Staphylococcus species NOT DETECTED NOT DETECTED Final   Staphylococcus aureus (BCID) NOT DETECTED NOT DETECTED Final   Streptococcus species DETECTED (A) NOT DETECTED Final    Comment: CRITICAL RESULT CALLED TO, READ BACK BY AND VERIFIED WITH: DAVID BESANTI AT 0425 ON 10/18/19 RWW    Streptococcus agalactiae DETECTED (A) NOT DETECTED Final    Comment: CRITICAL RESULT CALLED TO, READ BACK BY AND VERIFIED WITH: DAVID BESANTI AT 0425 ON 10/18/19 RWW    Streptococcus pneumoniae NOT DETECTED NOT DETECTED Final   Streptococcus pyogenes NOT DETECTED NOT DETECTED Final   Acinetobacter baumannii NOT DETECTED NOT DETECTED Final   Enterobacteriaceae species NOT DETECTED NOT DETECTED Final   Enterobacter cloacae complex NOT DETECTED NOT DETECTED Final   Escherichia coli NOT DETECTED NOT DETECTED Final   Klebsiella oxytoca NOT DETECTED NOT DETECTED Final   Klebsiella pneumoniae NOT DETECTED NOT DETECTED Final   Proteus species NOT DETECTED NOT DETECTED Final   Serratia marcescens NOT DETECTED NOT DETECTED Final   Haemophilus influenzae NOT DETECTED NOT DETECTED Final   Neisseria meningitidis NOT DETECTED NOT DETECTED Final   Pseudomonas aeruginosa NOT DETECTED NOT DETECTED Final   Candida albicans NOT DETECTED NOT DETECTED Final   Candida glabrata NOT DETECTED NOT DETECTED Final   Candida krusei NOT DETECTED NOT DETECTED Final   Candida parapsilosis NOT DETECTED NOT DETECTED Final   Candida tropicalis NOT DETECTED NOT DETECTED Final    Comment: Performed at [facility], [address] [place], [place] [address]  Blood culture (routine x 2)     Status: Abnormal   Collection Time: 10/17/19  4:36 PM   Specimen: BLOOD  Result Value  Ref Range Status   Specimen  Description   Final    BLOOD BLOOD LEFT ARM Performed at [facility], [address] [place], [place] [address]    Special Requests   Final    BOTTLES DRAWN AEROBIC AND ANAEROBIC Blood Culture adequate volume Performed at [facility], [address] [place], [place] [address]    Culture  Setup Time   Final    GRAM POSITIVE COCCI IN BOTH AEROBIC AND ANAEROBIC BOTTLES CRITICAL RESULT CALLED TO, READ BACK BY AND VERIFIED WITH: [place] ON 10/18/19 RWW Performed at [facility], [address] [place], [place] [address]    Culture (A)  Final    GROUP B STREP(S.AGALACTIAE)ISOLATED SUSCEPTIBILITIES PERFORMED ON PREVIOUS CULTURE WITHIN THE LAST 5 DAYS. Performed at [facility], [place] [address], [place], [place] [address]    Report Status 10/20/2019 FINAL  Final  SARS Coronavirus 2 by RT PCR (hospital order, performed in [facility] hospital lab) Nasopharyngeal Nasopharyngeal Swab     Status: None   Collection Time: 10/17/19  7:22 PM   Specimen: Nasopharyngeal Swab  Result Value Ref Range Status   SARS Coronavirus 2 NEGATIVE NEGATIVE Final    Comment: (NOTE) SARS-CoV-2 target nucleic acids are NOT DETECTED.  The SARS-CoV-2 RNA is generally detectable in upper and lower respiratory specimens during the acute phase of infection. The lowest concentration of SARS-CoV-2 viral copies this assay can detect is 250 copies / mL. A negative result does not preclude SARS-CoV-2 infection and should not be used as the sole basis for treatment or other patient management decisions.  A negative result may occur with improper specimen collection / handling, submission of specimen other than nasopharyngeal swab, presence of viral mutation(s) within the areas targeted by this assay, and inadequate number of viral copies (<250 copies / mL). A negative result must be combined with clinical observations, patient history, and epidemiological  information.  Fact Sheet for Patients:   StrictlyIdeas.no  Fact Sheet for Healthcare Providers: BankingDealers.co.za  This test is not yet approved or  cleared by the [place] FDA and has been authorized for detection and/or diagnosis of SARS-CoV-2 by FDA under an Emergency Use Authorization (EUA).  This EUA will remain in effect (meaning this test can be used) for the duration of the COVID-19 declaration under Section 564(b)(1) of the Act, 21 U.S.C. section 360bbb-3(b)(1), unless the authorization is terminated or revoked sooner.  Performed at [facility], [place], [place] [address]     IMAGING RESULTS: 2D echo shows EF of 55 to 60%.  There is moderate asymmetric left ventricular hypertrophy with a septal segment.  The valves were not well visualized.  I have personally reviewed the films ? Impression/Recommendation ? ?Group B streptococcus bacteremia.  Likely source is the skin because of multiple cat scratches on the right thigh and right.  She is improved a lot and there is no erythema currently. Because of the multiple scratches the question is whether she needs antibiotics like azithromycin prophylactically.  There is no clinical evidence of cat scratch disease like localized lymphadenopathy.  She is somewhat immunocompromised because of multiple sclerosis.  We need to keep a close eye on her and if she develops any lymphadenopathy it may be prudent to give azithromycin. Currently on Unasyn. After 5 days of IV antibiotics may be able to switch to p.o. for another 5 days.  Multiple sclerosis on teriflunomide Aubagio followed by Dr. Brigitte Pulse ?  _Neurogenic bladder has a Foley.  No evidence of UTI  History of follicular lymphoma History of lumbar fusion __________________________________________________ Discussed with patient, requesting provider Note:  This document was prepared using Dragon voice  recognition software and may include unintentional dictation errors.

## 2019-10-20 NOTE — Progress Notes (Signed)
   10/20/19 0930  Clinical Encounter Type  Visited With Patient  Visit Type Follow-up;Spiritual support;Social support  Referral From Chaplain  Consult/Referral To Chaplain  Ch visited with Pt while rounding unit 1C. Pt was alert and welcome Ch presences in the room. Pt said that she was really sick, but she is feeling a lot better. Pt hopes to go home soon. Pt asked Ch if he would pray for her. Ch prayed for Pt and will follow-up with Pt.

## 2019-10-20 NOTE — Care Management Important Message (Signed)
Important Message  Patient Details  Name: [name] MRN: [id number] Date of Birth: 05-Mar-1951   Medicare Important Message Given:  N/A - LOS <3 / Initial given by admissions     [name] 10/20/2019, 10:00 AM

## 2019-10-20 NOTE — Evaluation (Signed)
Occupational Therapy Evaluation Patient Details Name: [name] MRN: [id number] DOB: 1950-09-12 Today's Date: 10/20/2019    History of Present Illness presented to ER secondary to fever/chills; admitted for management of sepsis related to UTI   Clinical Impression   Pt was seen for OT evaluation this date. Prior to hospital admission, pt was MOD I with ADL transfers and fxl mobility with rollator and req'ed some minimal assist for ADLs from husband. Pt lives in [facility] with ramped entrance (and 2 steps) with her spouse. Currently pt demonstrates impairments as described below (See OT problem list) which functionally limit her ability to perform ADL/self-care tasks. Pt currently requires  setup for UB ADLs seated, MOD A for LB ADLs seated and MOD A from elevated surface with RW for ADL transfers.  Pt would benefit from skilled OT to address noted impairments and functional limitations (see below for any additional details) in order to maximize safety and independence while minimizing falls risk and caregiver burden. Upon hospital discharge, recommend STR to maximize pt safety and return to PLOF.     Follow Up Recommendations  SNF    Equipment Recommendations  None recommended by OT (pt reports having all necessary equipment)    Recommendations for Other Services       Precautions / Restrictions Precautions Precautions: Fall Restrictions Weight Bearing Restrictions: No      Mobility Bed Mobility Overal bed mobility: Needs Assistance Bed Mobility: Supine to Sit;Sit to Supine     Supine to sit: Min assist;HOB elevated Sit to supine: Min assist      Transfers Overall transfer level: Needs assistance   Transfers: Sit to/from Stand Sit to Stand: Mod assist;From elevated surface              Balance Overall balance assessment: Needs assistance Sitting-balance support: No upper extremity supported;Feet supported Sitting balance-Leahy Scale: Good     Standing balance  support: Bilateral upper extremity supported Standing balance-Leahy Scale: Poor                             ADL either performed or assessed with clinical judgement   ADL Overall ADL's : Needs assistance/impaired                                       General ADL Comments: setup for UB ADLs seated, MOD A for LB ADLs seated. MOD A from elevated surface with RW for ADL transfers.     Vision Patient Visual Report: No change from baseline       Perception     Praxis      Pertinent Vitals/Pain Pain Assessment: No/denies pain     Hand Dominance Right   Extremity/Trunk Assessment Upper Extremity Assessment Upper Extremity Assessment: Overall WFL for tasks assessed;RUE deficits/detail;LUE deficits/detail RUE Deficits / Details: shlf flexion 3/4 ROM, MMT 3-/5. Elbow and grip MMT 4+/5 LUE Deficits / Details: ROM WFL, shld, elbow, grip 4-/5   Lower Extremity Assessment Lower Extremity Assessment: Defer to PT evaluation;Generalized weakness   Cervical / Trunk Assessment Cervical / Trunk Assessment: Normal   Communication Communication Communication: No difficulties   Cognition Arousal/Alertness: Awake/alert Behavior During Therapy: WFL for tasks assessed/performed Overall Cognitive Status: Within Functional Limits for tasks assessed  General Comments       Exercises Other Exercises Other Exercises: OT facilitates education re: role of OT and pt somewhat familiar d/t past hospitalizations. In addition, ed re: importance of OOB activity including preventing PNA and skin breakdown. Pt with good reception.   Shoulder Instructions      Home Living Family/patient expects to be discharged to:: Private residence Living Arrangements: Spouse/significant other Available Help at Discharge: Family;Available 24 hours/day Type of Home: House Home Access: Stairs to enter;Ramped entrance Entrance  Stairs-Number of Steps: has a ramp, and then up two more steps (has a grab handle on the L) Entrance Stairs-Rails: None Home Layout: One level     Bathroom Shower/Tub: Occupational psychologist: Standard Bathroom Accessibility: Yes   Home Equipment: Wheelchair - [employer] - 4 wheels;Bedside commode;Grab bars - tub/shower;Other (comment);Shower seat          Prior Functioning/Environment Level of Independence: Needs assistance  Gait / Transfers Assistance Needed: Patient uses rollator within home.  Patient sleeps in lift chair to help with transfers. Pt able to use rollator with grab bar to transition into shower to seat. ADL's / Homemaking Assistance Needed: Husbands assists with all IADLs and some ADLs including bathing (drying off) and some dressing (bra and socks)   Comments: only able to complete HH distances with 4WW. Fearful of falling. Endorses 10 falls in past six months.        OT Problem List: Decreased strength;Decreased range of motion;Decreased activity tolerance;Impaired balance (sitting and/or standing);Obesity      OT Treatment/Interventions: Self-care/ADL training;Therapeutic exercise;DME and/or AE instruction;Therapeutic activities;Balance training;Patient/family education    OT Goals(Current goals can be found in the care plan section) Acute Rehab OT Goals Patient Stated Goal: to return home OT Goal Formulation: With patient Time For Goal Achievement: 11/03/19 Potential to Achieve Goals: Good  OT Frequency: Min 2X/week   Barriers to D/C:            Co-evaluation              AM-PAC OT "6 Clicks" Daily Activity     Outcome Measure Help from another person eating meals?: None Help from another person taking care of personal grooming?: A Little Help from another person toileting, which includes using toliet, bedpan, or urinal?: A Lot Help from another person bathing (including washing, rinsing, drying)?: A Lot Help from another person  to put on and taking off regular upper body clothing?: None Help from another person to put on and taking off regular lower body clothing?: A Lot 6 Click Score: 17   End of Session Equipment Utilized During Treatment: Gait belt;Rolling walker Nurse Communication: Mobility status  Activity Tolerance: Patient tolerated treatment well Patient left: in bed;with call bell/phone within reach;with bed alarm set  OT Visit Diagnosis: Unsteadiness on feet (R26.81);Other abnormalities of gait and mobility (R26.89);Muscle weakness (generalized) (M62.81);History of falling (Z91.81)                Time: 6387-5643 OT Time Calculation (min): 49 min Charges:  OT General Charges $OT Visit: 1 Visit OT Evaluation $OT Eval Moderate Complexity: 1 Mod OT Treatments $Self Care/Home Management : 8-22 mins $Therapeutic Activity: 8-22 mins  Gerrianne Scale, MS, OTR/L ascom [phone] 10/20/19, 4:29 PM

## 2019-10-20 NOTE — TOC Initial Note (Signed)
Transition of Care [facility]) - Initial/Assessment Note    Patient Details  Name: [name] MRN: [id number] Date of Birth: [birth year]  Transition of [facility]) CM/SW Contact:    Shelbie Hutching, RN Phone Number: 10/20/2019, 3:41 PM  Clinical Narrative:                 Patient admitted for Sepsis related to UTI.  Patient has a suprapubic catheter.  Patient is from home and lives with husband.  Patient walks with a rollator and says she sleeps in her lift chair in the living room.  Patient has a wheelchair and pull bars in the bathroom.  Physical therapy has recommended SNF but patient is refusing SNF placement.  This RNCM will arrange home health services when medically ready to discharge.  Patient has had [facility] in the past for home health services.  Referral given to [facility] with [facility] for PT, OT, and aide.  Patient's husband will pick her up at discharge.  Patient has no equipment needs.   Expected Discharge Plan: [place] Barriers to Discharge: Continued Medical Work up   Patient Goals and CMS Choice Patient states their goals for this hospitalization and ongoing recovery are:: to feel better and be able to walk around CMS Medicare.gov Compare Post Acute Care list provided to:: Patient Choice offered to / list presented to : Patient  Expected Discharge Plan and Services Expected Discharge Plan: [place]   Discharge Planning Services: CM Consult Post Acute Care Choice: [place] arrangements for the past 2 months: Single Family Home                           HH Arranged: PT, OT, Nurse's Aide     Time HH Agency Contacted: [phone]    Prior Living Arrangements/Services Living arrangements for the past 2 months: [place] Lives with:: Spouse Patient language and need for interpreter reviewed:: Yes Do you feel safe going back to the place where you live?: Yes      Need for Family Participation in Patient  Care: Yes (Comment) Care giver support system in place?: Yes (comment) (husband) Current home services: DME (lift chair, wheelchair, rollator, grab bars in the bathroom) Criminal Activity/Legal Involvement Pertinent to Current Situation/Hospitalization: No - Comment as needed  Activities of Daily Living Home Assistive Devices/Equipment: Environmental consultant (specify type), Wheelchair, Shower chair with back, Other (Comment) (lift chair) ADL Screening (condition at time of admission) Patient's cognitive ability adequate to safely complete daily activities?: Yes Is the patient deaf or have difficulty hearing?: No Does the patient have difficulty seeing, even when wearing glasses/contacts?: No Does the patient have difficulty concentrating, remembering, or making decisions?: No Patient able to express need for assistance with ADLs?: Yes Does the patient have difficulty dressing or bathing?: Yes Independently performs ADLs?: No Does the patient have difficulty walking or climbing stairs?: Yes Weakness of Legs: Both Weakness of Arms/Hands: None  Permission Sought/Granted Permission sought to share information with : Case Manager, Family Supports, Other (comment) Permission granted to share information with : Yes, Verbal Permission Granted     Permission granted to share info w AGENCY: [place] granted to share info w Relationship: Husband     Emotional Assessment Appearance:: Appears stated age Attitude/Demeanor/Rapport: Engaged Affect (typically observed): Accepting Orientation: : Oriented to Self, Oriented to Place, Oriented to  Time, Oriented to Situation Alcohol /  Substance Use: Not Applicable Psych Involvement: No (comment)  Admission diagnosis:  UTI (urinary tract infection) [N39.0] Sepsis secondary to UTI ([place]) [A41.9, N39.0] Sepsis due to urinary tract infection ([place]) [A41.9, N39.0] Sepsis ([place]) [A41.9] Patient Active Problem List   Diagnosis Date Noted  . Sepsis  secondary to UTI ([place]) 10/17/2019  . Generalized weakness   . AKI (acute kidney injury) ([place]) 04/20/2019  . Pressure injury of skin 02/03/2019  . Bilateral lower leg cellulitis 02/02/2019  . Ovarian mass, left 01/27/2019  . Sepsis ([place]) 01/05/2019  . UTI (urinary tract infection) 06/13/2018  . Altered mental status 06/11/2018  . Fall 05/13/2018  . Depression 05/13/2018  . Recurrent cellulitis of lower extremity 06/25/2017  . Medication monitoring encounter 06/05/2017  . Cellulitis of left lower leg 04/25/2017  . Adjustment disorder with mixed disturbance of emotions and conduct 04/23/2017  . Foot pain, bilateral 02/03/2017  . Tinea pedis 02/03/2017  . Left ovarian cyst 12/09/2016  . Abdominal aortic atherosclerosis ([place]) 11/11/2016  . Swelling of lower extremity 10/11/2016  . Obstructive sleep apnea 10/11/2016  . Follicular lymphoma of intra-abdominal lymph nodes ([place]) 08/06/2016  . Multiple falls 06/07/2016  . Inguinal adenopathy 05/29/2016  . Obesity, morbid ([place]) 01/05/2016  . Low HDL (under 40) 12/19/2015  . Cellulitis 12/18/2015  . Skin ulcer ([place]) 11/08/2015  . Peripheral vascular disease of lower extremity with ulceration ([place]) 11/08/2015  . CKD (chronic kidney disease) stage 3, GFR 30-59 ml/min 11/08/2015  . Constipation due to pain medication 01/31/2015  . Obstructive sleep apnea of adult 01/13/2015  . Pelvic muscle wasting 01/13/2015  . Incomplete bladder emptying 01/13/2015  . Headache, migraine 10/24/2014  . 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 Current tobacco use 10/24/2014  . Lumbar radiculopathy, chronic 10/02/2013  . COPD with bronchial hyperresponsiveness ([place]) 10/02/2013  . Major depressive disorder, recurrent, in partial remission ([place]) 10/02/2013  . Hypertension goal BP (blood pressure) < 140/90 10/02/2013  . Multiple sclerosis ([place]) 10/02/2013  . Absence of bladder continence 09/25/2012  . Acontractile bladder 02/12/2012  . Narrowing of intervertebral  disc space 11/26/2011  . Kyphoscoliosis and scoliosis 11/26/2011   PCP:  Kirk Ruths, MD Pharmacy:   [place], [place] - [address] [place] [address] Phone: [phone] Fax: [phone]  [facility], [place], [place] - [address] [place] [address] Phone: [phone] Fax: [phone]     Social Determinants of Health (SDOH) Interventions    Readmission Risk Interventions Readmission Risk Prevention Plan 04/22/2019 02/04/2019 01/07/2019  Transportation Screening Complete - Complete  PCP or Specialist Appt within 3-5 Days - - Complete  HRI or [place] - Complete Complete  Social Work Consult for [place] Planning/Counseling - Complete Complete  Palliative Care Screening - Not Applicable Not Applicable  Medication Review Press photographer) Referral to Pharmacy - Complete  PCP or Specialist appointment within 3-5 days of discharge Complete - -  [place] or Home Care Consult Complete - -  SW Recovery Care/Counseling Consult Complete - -  Palliative Care Screening Not Applicable - -  Skilled Nursing Facility Complete - -  Some recent data might be hidden

## 2019-10-21 DIAGNOSIS — A401 Sepsis due to streptococcus, group B: Secondary | ICD-10-CM

## 2019-10-21 DIAGNOSIS — L03115 Cellulitis of right lower limb: Secondary | ICD-10-CM

## 2019-10-21 DIAGNOSIS — R531 Weakness: Secondary | ICD-10-CM

## 2019-10-21 DIAGNOSIS — R6520 Severe sepsis without septic shock: Secondary | ICD-10-CM

## 2019-10-21 MED ORDER — IPRATROPIUM-ALBUTEROL 0.5-2.5 (3) MG/3ML IN SOLN
3.0000 mL | Freq: Four times a day (QID) | RESPIRATORY_TRACT | Status: DC
  Administered 2019-10-21 – 2019-10-22 (×3): 3 mL via RESPIRATORY_TRACT
  Filled 2019-10-21 (×4): qty 3

## 2019-10-21 MED ORDER — LISINOPRIL 20 MG PO TABS
20.0000 mg | ORAL_TABLET | Freq: Every day | ORAL | Status: DC
  Administered 2019-10-21 – 2019-10-22 (×2): 20 mg via ORAL
  Filled 2019-10-21 (×2): qty 1

## 2019-10-21 NOTE — Progress Notes (Signed)
Patient ID: [name], female   DOB: 1951-02-08, 69 y.o.   MRN: [id number] [facility] PROGRESS NOTE  [name] TWS:[id number] DOB: July 20, 1950 DOA: 10/17/2019 PCP: Kirk Ruths, MD  HPI/Subjective: Patient was admitted with fever and chills and found to have group B streptococcus sepsis.  Patient feeling a little bit better.  Still feels a little weak.  Appetite not great.  Objective: Vitals:   10/21/19 0802 10/21/19 1203  BP: (!) 144/70 (!) 145/81  Pulse: 68 73  Resp: 14 18  Temp: 99.1 F (37.3 C) 98.9 F (37.2 C)  SpO2: 96% 100%    Intake/Output Summary (Last 24 hours) at 10/21/2019 1339 Last data filed at 10/21/2019 0017 Gross per 24 hour  Intake 1147.57 ml  Output 1200 ml  Net -52.43 ml   Filed Weights   10/17/19 1529  Weight: 133 kg    ROS: Review of Systems  Respiratory: Negative for shortness of breath.   Cardiovascular: Negative for chest pain.  Gastrointestinal: Negative for abdominal pain.   Exam: Physical Exam  HENT:  Nose: No mucosal edema.  Mouth/Throat: No oropharyngeal exudate.  Eyes: Pupils are equal, round, and reactive to light. Conjunctivae and lids are normal.  Cardiovascular: S1 normal and S2 normal. Exam reveals no gallop.  No murmur heard. Respiratory: No respiratory distress. She has decreased breath sounds in the right lower field and the left lower field. She has no wheezes. She has rhonchi in the right lower field and the left lower field. She has no rales.  GI: Soft. There is no abdominal tenderness.  Musculoskeletal:     Right ankle: No swelling.     Left ankle: No swelling.  Neurological: She is alert.  Skin: Skin is warm. No rash noted. Nails show no clubbing.      Data Reviewed: Basic Metabolic Panel: Recent Labs  Lab 10/17/19 1636 10/18/19 0703 10/19/19 0604 10/20/19 0426  NA 132* 134* 139 141  K 4.6 3.7 3.3* 3.1*  CL 95* 98 105 105  CO2 27 26 28 27   GLUCOSE 148* 110* 97 91  BUN 18 16 14 12    CREATININE 1.33* 1.34* 1.13* 1.04*  CALCIUM 9.4 8.5* 8.9 8.4*   Liver Function Tests: Recent Labs  Lab 10/17/19 1636 10/18/19 0703  AST 26 21  ALT 19 16  ALKPHOS 79 56  BILITOT 0.9 0.7  PROT 8.0 6.6  ALBUMIN 4.1 3.3*   No results for input(s): LIPASE, AMYLASE in the last 168 hours. No results for input(s): AMMONIA in the last 168 hours. CBC: Recent Labs  Lab 10/17/19 1553 10/18/19 0703 10/19/19 0604 10/20/19 0426  WBC 10.8* 10.7* 8.2 5.9  NEUTROABS 9.5*  --   --   --   HGB 12.9 10.7* 11.5* 10.2*  HCT 38.3 32.0* 34.5* 30.2*  MCV 84.4 85.6 85.6 83.9  PLT 136* 142* 131* 133*     Recent Results (from the past 240 hour(s))  Urine culture     Status: Abnormal   Collection Time: 10/17/19  3:53 PM   Specimen: Urine, Random  Result Value Ref Range Status   Specimen Description   Final    URINE, RANDOM Performed at [facility], [address] [place], [place] [address]    Special Requests   Final    NONE Performed at [facility], [address] [place], [place] [address]    Culture (A)  Final    >=100,000 COLONIES/mL MULTIPLE SPECIES PRESENT, SUGGEST RECOLLECTION   Report  Status 10/19/2019 FINAL  Final  Blood culture (routine x 2)     Status: Abnormal   Collection Time: 10/17/19  3:54 PM   Specimen: BLOOD  Result Value Ref Range Status   Specimen Description   Final    BLOOD RIGHT ANTECUBITAL Performed at [facility], [address] [place], [place] [address]    Special Requests   Final    BOTTLES DRAWN AEROBIC AND ANAEROBIC Blood Culture results may not be optimal due to an inadequate volume of blood received in culture bottles Performed at [facility], [address], [place], [place] [address]    Culture  Setup Time   Final    ANAEROBIC BOTTLE ONLY GRAM POSITIVE COCCI CRITICAL RESULT CALLED TO, READ BACK BY AND VERIFIED WITH: DAVID BESANTI AT 0425 ON 10/18/19 RWW Performed at [facility], [place] [address] [place], [place] [address]    Culture GROUP B STREP(S.AGALACTIAE)ISOLATED (A)  Final   Report Status 10/20/2019 FINAL  Final   Organism ID, Bacteria GROUP B STREP(S.AGALACTIAE)ISOLATED  Final      Susceptibility   Group b strep(s.agalactiae)isolated - MIC*    CLINDAMYCIN >=1 RESISTANT Resistant     AMPICILLIN <=0.25 SENSITIVE Sensitive     ERYTHROMYCIN >=8 RESISTANT Resistant     VANCOMYCIN 0.5 SENSITIVE Sensitive     CEFTRIAXONE <=0.12 SENSITIVE Sensitive     LEVOFLOXACIN 1 SENSITIVE Sensitive     * GROUP B STREP(S.AGALACTIAE)ISOLATED  Blood Culture ID Panel (Reflexed)     Status: Abnormal   Collection Time: 10/17/19  3:54 PM  Result Value Ref Range Status   Enterococcus species NOT DETECTED NOT DETECTED Final   Listeria monocytogenes NOT DETECTED NOT DETECTED Final   Staphylococcus species NOT DETECTED NOT DETECTED Final   Staphylococcus aureus (BCID) NOT DETECTED NOT DETECTED Final   Streptococcus species DETECTED (A) NOT DETECTED Final    Comment: CRITICAL RESULT CALLED TO, READ BACK BY AND VERIFIED WITH: DAVID BESANTI AT 0425 ON 10/18/19 RWW    Streptococcus agalactiae DETECTED (A) NOT DETECTED Final    Comment: CRITICAL RESULT CALLED TO, READ BACK BY AND VERIFIED WITH: DAVID BESANTI AT 0425 ON 10/18/19 RWW    Streptococcus pneumoniae NOT DETECTED NOT DETECTED Final   Streptococcus pyogenes NOT DETECTED NOT DETECTED Final   Acinetobacter baumannii NOT DETECTED NOT DETECTED Final   Enterobacteriaceae species NOT DETECTED NOT DETECTED Final   Enterobacter cloacae complex NOT DETECTED NOT DETECTED Final   Escherichia coli NOT DETECTED NOT DETECTED Final   Klebsiella oxytoca NOT DETECTED NOT DETECTED Final   Klebsiella pneumoniae NOT DETECTED NOT DETECTED Final   Proteus species NOT DETECTED NOT DETECTED Final   Serratia marcescens NOT DETECTED NOT DETECTED Final   Haemophilus influenzae NOT DETECTED NOT DETECTED Final   Neisseria meningitidis NOT DETECTED NOT DETECTED Final    Pseudomonas aeruginosa NOT DETECTED NOT DETECTED Final   Candida albicans NOT DETECTED NOT DETECTED Final   Candida glabrata NOT DETECTED NOT DETECTED Final   Candida krusei NOT DETECTED NOT DETECTED Final   Candida parapsilosis NOT DETECTED NOT DETECTED Final   Candida tropicalis NOT DETECTED NOT DETECTED Final    Comment: Performed at [facility], [address] [place], [place] [address]  Blood culture (routine x 2)     Status: Abnormal   Collection Time: 10/17/19  4:36 PM   Specimen: BLOOD  Result Value Ref Range Status   Specimen Description   Final    BLOOD BLOOD LEFT  ARM Performed at [facility], [address], [place], [place] [address]    Special Requests   Final    BOTTLES DRAWN AEROBIC AND ANAEROBIC Blood Culture adequate volume Performed at [facility], [address] [place], [place] [address]    Culture  Setup Time   Final    GRAM POSITIVE COCCI IN BOTH AEROBIC AND ANAEROBIC BOTTLES CRITICAL RESULT CALLED TO, READ BACK BY AND VERIFIED WITH: [place] ON 10/18/19 RWW Performed at [facility], [address] [place], [place] [address]    Culture (A)  Final    GROUP B STREP(S.AGALACTIAE)ISOLATED SUSCEPTIBILITIES PERFORMED ON PREVIOUS CULTURE WITHIN THE LAST 5 DAYS. Performed at [facility], [place] [address] [place], [place] [address]    Report Status 10/20/2019 FINAL  Final  SARS Coronavirus 2 by RT PCR (hospital order, performed in [facility] hospital lab) Nasopharyngeal Nasopharyngeal Swab     Status: None   Collection Time: 10/17/19  7:22 PM   Specimen: Nasopharyngeal Swab  Result Value Ref Range Status   SARS Coronavirus 2 NEGATIVE NEGATIVE Final    Comment: (NOTE) SARS-CoV-2 target nucleic acids are NOT DETECTED.  The SARS-CoV-2 RNA is generally detectable in upper and lower respiratory specimens during the acute phase of infection. The lowest concentration of SARS-CoV-2 viral copies this assay  can detect is 250 copies / mL. A negative result does not preclude SARS-CoV-2 infection and should not be used as the sole basis for treatment or other patient management decisions.  A negative result may occur with improper specimen collection / handling, submission of specimen other than nasopharyngeal swab, presence of viral mutation(s) within the areas targeted by this assay, and inadequate number of viral copies (<250 copies / mL). A negative result must be combined with clinical observations, patient history, and epidemiological information.  Fact Sheet for Patients:   StrictlyIdeas.no  Fact Sheet for Healthcare Providers: BankingDealers.co.za  This test is not yet approved or  cleared by the [place] FDA and has been authorized for detection and/or diagnosis of SARS-CoV-2 by FDA under an Emergency Use Authorization (EUA).  This EUA will remain in effect (meaning this test can be used) for the duration of the COVID-19 declaration under Section 564(b)(1) of the Act, 21 U.S.C. section 360bbb-3(b)(1), unless the authorization is terminated or revoked sooner.  Performed at [facility], [address] [place], [place] [address]   CULTURE, BLOOD (ROUTINE X 2) w Reflex to ID Panel     Status: None (Preliminary result)   Collection Time: 10/20/19  8:20 AM   Specimen: BLOOD  Result Value Ref Range Status   Specimen Description BLOOD RIGHT ASSIST CONTROL  Final   Special Requests   Final    BOTTLES DRAWN AEROBIC AND ANAEROBIC Blood Culture adequate volume   Culture   Final    NO GROWTH < 24 HOURS Performed at [facility], [name], [place],  [address]    Report Status PENDING  Incomplete  CULTURE, BLOOD (ROUTINE X 2) w Reflex to ID Panel     Status: None (Preliminary result)   Collection Time: 10/20/19  8:29 AM   Specimen: BLOOD  Result Value Ref Range Status   Specimen Description BLOOD  RIGHT HAND  Final   Special Requests   Final    BOTTLES DRAWN AEROBIC AND ANAEROBIC Blood Culture results may not be optimal due to an inadequate volume of blood received in culture bottles  Culture   Final    NO GROWTH < 24 HOURS Performed at [facility], [address] [place], [place] [address]    Report Status PENDING  Incomplete     Studies: ECHOCARDIOGRAM COMPLETE  Result Date: 10/20/2019    ECHOCARDIOGRAM REPORT   Patient Name:   SHANAIYA BENE [facility] Date of Exam: 10/20/2019 Medical Rec #:  [id number]     Height:       66.0 in Accession #:    [id number]    Weight:       293.2 lb Date of Birth:  09-15-1950      BSA:          2.353 m Patient Age:    68 years      BP:           134/65 mmHg Patient Gender: F             HR:           61 bpm. Exam Location:  [facility] Procedure: 2D Echo, Cardiac Doppler and Color Doppler Indications:     Bacteremia 790.7  History:         Patient has prior history of Echocardiogram examinations, most                  recent 01/06/2019. COPD; Risk Factors:Hypertension. PVD.  Sonographer:     Sherrie Sport RDCS (AE) Referring Phys:  [id number] Loletha Grayer Diagnosing Phys: Nelva Bush MD IMPRESSIONS  1. Left ventricular ejection fraction, by estimation, is 55 to 60%. The left ventricle has normal function. The left ventricle has no regional wall motion abnormalities. There is moderate asymmetric left ventricular hypertrophy of the septal segment. Left ventricular diastolic parameters are consistent with Grade I diastolic dysfunction (impaired relaxation). Elevated left atrial pressure.  2. Right ventricular systolic function is normal. The right ventricular size is normal. Tricuspid regurgitation signal is inadequate for assessing PA pressure.  3. The mitral valve was not well visualized. Trivial mitral valve regurgitation. No evidence of mitral stenosis.  4. The aortic valve was not well visualized. Aortic valve regurgitation is not visualized. No aortic stenosis is  present. FINDINGS  Left Ventricle: Left ventricular ejection fraction, by estimation, is 55 to 60%. The left ventricle has normal function. The left ventricle has no regional wall motion abnormalities. The left ventricular internal cavity size was normal in size. There is  moderate asymmetric left ventricular hypertrophy of the septal segment. Left ventricular diastolic parameters are consistent with Grade I diastolic dysfunction (impaired relaxation). Elevated left atrial pressure. Right Ventricle: The right ventricular size is normal. Right vetricular wall thickness was not assessed. Right ventricular systolic function is normal. Tricuspid regurgitation signal is inadequate for assessing PA pressure. Left Atrium: Left atrial size was normal in size. Right Atrium: Right atrial size was normal in size. Pericardium: The pericardium was not well visualized. Mitral Valve: The mitral valve was not well visualized. Mild mitral annular calcification. Trivial mitral valve regurgitation. No evidence of mitral valve stenosis. Tricuspid Valve: The tricuspid valve is not well visualized. Tricuspid valve regurgitation is trivial. Aortic Valve: The aortic valve was not well visualized. Aortic valve regurgitation is not visualized. No aortic stenosis is present. Aortic valve mean gradient measures 4.5 mmHg. Aortic valve peak gradient measures 8.1 mmHg. Aortic valve area, by VTI measures 2.59 cm. Pulmonic Valve: The pulmonic valve was not well visualized. Pulmonic valve regurgitation is not visualized. No evidence of pulmonic stenosis. Aorta: The aortic root is normal in  size and structure. Pulmonary Artery: The pulmonary artery is not well seen. Venous: The inferior vena cava was not well visualized. IAS/Shunts: The interatrial septum was not well visualized.  LEFT VENTRICLE PLAX 2D LVIDd:         3.59 cm  Diastology LVIDs:         2.59 cm  LV e' lateral:   9.79 cm/s LV PW:         1.08 cm  LV E/e' lateral: 10.7 LV IVS:         1.08 cm  LV e' medial:    5.66 cm/s LVOT diam:     2.00 cm  LV E/e' medial:  18.6 LV SV:         88 LV SV Index:   37 LVOT Area:     3.14 cm  RIGHT VENTRICLE RV Basal diam:  3.20 cm RV S prime:     14.80 cm/s TAPSE (M-mode): 2.7 cm LEFT ATRIUM             Index       RIGHT ATRIUM           Index LA diam:        4.00 cm 1.70 cm/m  RA Area:     12.70 cm LA Vol (A2C):   49.4 ml 21.00 ml/m RA Volume:   28.20 ml  11.99 ml/m LA Vol (A4C):   28.4 ml 12.07 ml/m LA Biplane Vol: 40.8 ml 17.34 ml/m  AORTIC VALVE                    PULMONIC VALVE AV Area (Vmax):    2.68 cm     PV Vmax:        0.81 m/s AV Area (Vmean):   2.41 cm     PV Peak grad:   2.6 mmHg AV Area (VTI):     2.59 cm     RVOT Peak grad: 3 mmHg AV Vmax:           142.00 cm/s AV Vmean:          104.800 cm/s AV VTI:            0.338 m AV Peak Grad:      8.1 mmHg AV Mean Grad:      4.5 mmHg LVOT Vmax:         121.00 cm/s LVOT Vmean:        80.500 cm/s LVOT VTI:          0.279 m LVOT/AV VTI ratio: 0.82  AORTA Ao Root diam: 3.10 cm MITRAL VALVE MV Area (PHT): 2.62 cm     SHUNTS MV Decel Time: 289 msec     Systemic VTI:  0.28 m MV E velocity: 105.00 cm/s  Systemic Diam: 2.00 cm MV A velocity: 123.00 cm/s MV E/A ratio:  0.85 Harrell Gave End MD Electronically signed by Nelva Bush MD Signature Date/Time: 10/20/2019/1:23:03 PM    Final     Scheduled Meds: . amLODipine  5 mg Oral Daily  . atorvastatin  10 mg Oral Daily  . buPROPion  300 mg Oral Daily  . Chlorhexidine Gluconate Cloth  6 each Topical Daily  . DULoxetine  60 mg Oral Daily  . enoxaparin (LOVENOX) injection  40 mg Subcutaneous Q12H  . famotidine  20 mg Oral Daily  . gabapentin  600 mg Oral TID  . ipratropium-albuterol  3 mL Nebulization Q6H  . lisinopril  20 mg Oral Daily  .  magnesium oxide  400 mg Oral Daily  . melatonin  5 mg Oral QHS  . mometasone-formoterol  2 puff Inhalation BID  . QUEtiapine  150 mg Oral QHS  . tiZANidine  4 mg Oral TID   Continuous Infusions: .  ampicillin-sulbactam (UNASYN) IV 3 g (10/21/19 1121)    Assessment/Plan:  1. Clinical sepsis present on admission with acute kidney injury.  Group B streptococcus growing in first set of blood cultures.  Repeat blood cultures negative.  Cellulitis could be the initial source.  Patient's suprapubic catheter was changed in the emergency room. 2. Cellulitis right lower extremity could be secondary to cat scratch.  On Unasyn 3. Acute kidney injury on chronic kidney disease stage IIIa.  Creatinine improved with IV fluids 4. Hypokalemia.  Replace potassium orally 5. Essential hypertension on amlodipine can go back on lisinopril at this point. 6. Neuropathy on gabapentin. 7. Weakness.  Physical therapy recommended rehab yesterday but patient would like to go home. 8. Slight rhonchi in the lungs will give nebulizer treatments standing dose 9. History of MS    Code Status:     Code Status Orders  (From admission, onward)         Start     Ordered   10/17/19 1928  Full code  Continuous        10/17/19 1927        Code Status History    Date Active Date Inactive Code Status Order ID Comments User Context   06/20/2019 1654 06/23/2019 0206 Full Code [id number]  Para Skeans, MD ED   04/20/2019 2116 04/24/2019 1710 Full Code [id number]  Mansy, Arvella Merles, MD ED   02/02/2019 1553 02/09/2019 2027 Full Code [id number]  [name], MD ED   01/05/2019 0647 01/08/2019 2202 Full Code [id number]  Harrie Foreman, MD ED   06/11/2018 2158 06/14/2018 1807 Full Code [id number]  Saundra Shelling, MD Inpatient   07/07/2017 2000 07/08/2017 1631 Full Code [id number]  Dustin Flock, MD Inpatient   05/14/2017 0152 05/15/2017 1953 Full Code [id number]  Lance Coon, MD Inpatient   04/01/2017 1723 04/05/2017 1742 Full Code [id number]  Daylene Posey, RN Inpatient   02/15/2017 1715 02/17/2017 2008 Full Code [id number]  Henreitta Leber, MD Inpatient   02/06/2017 2356 02/09/2017 1858 Full Code [id number]  Dustin Flock, MD  Inpatient   01/02/2017 1903 01/06/2017 1848 Full Code [id number]  [name], MD ED   12/29/2016 1252 12/30/2016 1722 Full Code [id number]  Nicholes Mango, MD Inpatient   10/11/2016 2320 10/14/2016 1726 Full Code [id number]  Theodoro Grist, MD Inpatient   08/28/2016 1159 08/30/2016 1932 Full Code [id number]  Loletha Grayer, MD ED   12/18/2015 1956 12/19/2015 1939 Full Code [id number]  Hower, Aaron Mose, MD ED   Advance Care Planning Activity     Family Communication: Spoke with husband on the phone Disposition Plan: Status is: Inpatient  Dispo: The patient is from: Home              Anticipated d/c is to: Patient changed her mind and would like to go home instead of to rehab.              Anticipated d/c date is: Potentially 10/22/2019 versus 10/23/2019.              Patient currently being treated for sepsis with group B streptococcus and blood cultures on IV Unasyn.  Consultants:  Infectious disease  Antibiotics:  Unasyn  Time spent: 27 minutes  Nuno Brubacher  [place]

## 2019-10-21 NOTE — TOC Progression Note (Signed)
Transition of Care [facility]) - Progression Note    Patient Details  Name: [name] MRN: [id number] Date of Birth: [birth year]  Transition of Care [facility]) CM/SW Contact  Shelbie Hutching, RN Phone Number: 10/21/2019, 3:39 PM  Clinical Narrative:    [place] is unable to accept referral for home health services but Sharmon Revere with [facility] has accepted the referral for PT and OT.     Expected Discharge Plan: [place] Barriers to Discharge: Continued Medical Work up  Expected Discharge Plan and Services Expected Discharge Plan: [place]   Discharge Planning Services: CM Consult Post Acute Care Choice: [place] arrangements for the past 2 months: Single Family Home                           HH Arranged: PT, OT [facility] Agency: [place] Date [place]: 10/21/19 Time [place]: [place] Representative spoke with at [place]: [place] ([place]) Interventions    Readmission Risk Interventions Readmission Risk Prevention Plan 04/22/2019 02/04/2019 01/07/2019  Transportation Screening Complete - Complete  PCP or Specialist Appt within 3-5 Days - - Complete  HRI or [place] - Complete Complete  Social Work Consult for [place] Planning/Counseling - Complete Complete  Palliative Care Screening - Not Applicable Not Applicable  Medication Review Press photographer) Referral to Pharmacy - Complete  PCP or Specialist appointment within 3-5 days of discharge Complete - -  [place] or Home Care Consult Complete - -  SW Recovery Care/Counseling Consult Complete - -  Palliative Care Screening Not Applicable - -  Skilled Nursing Facility Complete - -  Some recent data might be hidden

## 2019-10-21 NOTE — Progress Notes (Signed)
Physical Therapy Treatment Patient Details Name: [name] MRN: [id number] DOB: 1950-10-24 Today's Date: 10/21/2019    History of Present Illness presented to ER secondary to fever/chills; admitted for management of sepsis related to UTI    PT Comments    Pt agreeable to participate with therapy. Reports she doesn't want to go to rehab and is looking forward to going home. Performed OOB mobility, however still needs physical assist to complete in addition to elevated surface. Multiple attempts to achieve upright posture and limited and unsteady ambulation to recliner. Pt pleased with progress but seems unaware of safety concerns. Discussed with pt that she will need physical assist for all mobility due to weakness. Very high falls risk. Pt continues to refuse discussion regarding further SNF therapy. Will continue to progress as able.    Follow Up Recommendations  SNF     Equipment Recommendations       Recommendations for Other Services       Precautions / Restrictions Precautions Precautions: Fall Restrictions Weight Bearing Restrictions: No    Mobility  Bed Mobility Overal bed mobility: Needs Assistance Bed Mobility: Supine to Sit     Supine to sit: Min assist     General bed mobility comments: needs assist for trunkal elevation. Once seated at EOB, able to demonstrate upright posture.  Transfers Overall transfer level: Needs assistance Equipment used: Rolling walker (2 wheeled) Transfers: Sit to/from Stand Sit to Stand: Mod assist;From elevated surface         General transfer comment: requires elevated surface prior to transfer. Has difficulty with standing, taking multiple attempts to achieve upright posture. Post leaning noted once standing, with min assist to correct.  Ambulation/Gait Ambulation/Gait assistance: Min assist Gait Distance (Feet): 3 Feet Assistive device: Rolling walker (2 wheeled) Gait Pattern/deviations: Step-to pattern     General  Gait Details: lateral and cautious steps towards HOB. Needs assist for RW management to ambulate over to recliner.    Stairs             Wheelchair Mobility    Modified Rankin (Stroke Patients Only)       Balance Overall balance assessment: Needs assistance Sitting-balance support: No upper extremity supported;Feet supported Sitting balance-Leahy Scale: Good     Standing balance support: Bilateral upper extremity supported Standing balance-Leahy Scale: Fair Standing balance comment: post lean, takes min assist to correct                            Cognition Arousal/Alertness: Awake/alert Behavior During Therapy: WFL for tasks assessed/performed Overall Cognitive Status: Within Functional Limits for tasks assessed                                        Exercises Other Exercises Other Exercises: Multiple attempts for standing as pt with incontinent BM during standing, unable to perform self hygiene. Once seated in recliner, performed seated ther-ex including B LE AP, LAQ, and hip abd/ad. All ther-ex performed x 10 reps with cga    General Comments        Pertinent Vitals/Pain Pain Assessment: No/denies pain    Home Living                      Prior Function            PT Goals (current goals can now  be found in the care plan section) Acute Rehab PT Goals Patient Stated Goal: to return home PT Goal Formulation: With patient Time For Goal Achievement: 11/02/19 Potential to Achieve Goals: Fair Progress towards PT goals: Progressing toward goals    Frequency    Min 2X/week      PT Plan Current plan remains appropriate    Co-evaluation              AM-PAC PT "6 Clicks" Mobility   Outcome Measure  Help needed turning from your back to your side while in a flat bed without using bedrails?: A Little Help needed moving from lying on your back to sitting on the side of a flat bed without using bedrails?: A  Little Help needed moving to and from a bed to a chair (including a wheelchair)?: A Lot Help needed standing up from a chair using your arms (e.g., wheelchair or bedside chair)?: A Lot Help needed to walk in hospital room?: Total Help needed climbing 3-5 steps with a railing? : Total 6 Click Score: 12    End of Session Equipment Utilized During Treatment: Gait belt Activity Tolerance: Patient tolerated treatment well Patient left: in chair;with chair alarm set Nurse Communication: Mobility status PT Visit Diagnosis: Repeated falls (R29.6);History of falling (Z91.81);Difficulty in walking, not elsewhere classified (R26.2);Muscle weakness (generalized) (M62.81)     Time: 4827-0786 PT Time Calculation (min) (ACUTE ONLY): 13 min  Charges:  $Gait Training: 8-22 mins                     [name], [place], DPT [phone]    [name] 10/21/2019, 3:11 PM

## 2019-10-22 DIAGNOSIS — G629 Polyneuropathy, unspecified: Secondary | ICD-10-CM

## 2019-10-22 LAB — BASIC METABOLIC PANEL
Anion gap: 9 (ref 5–15)
BUN: 11 mg/dL (ref 8–23)
CO2: 28 mmol/L (ref 22–32)
Calcium: 8.6 mg/dL — ABNORMAL LOW (ref 8.9–10.3)
Chloride: 106 mmol/L (ref 98–111)
Creatinine, Ser: 0.89 mg/dL (ref 0.44–1.00)
GFR calc Af Amer: >60 mL/min (ref >60)
GFR calc non Af Amer: >60 mL/min (ref >60)
Glucose, Bld: 103 mg/dL — ABNORMAL HIGH (ref 70–99)
Potassium: 3.3 mmol/L — ABNORMAL LOW (ref 3.5–5.1)
Sodium: 143 mmol/L (ref 135–145)

## 2019-10-22 LAB — CBC
HCT: 31.6 % — ABNORMAL LOW (ref 36.0–46.0)
Hemoglobin: 10.6 g/dL — ABNORMAL LOW (ref 12.0–15.0)
MCH: 28.4 pg (ref 26.0–34.0)
MCHC: 33.5 g/dL (ref 30.0–36.0)
MCV: 84.7 fL (ref 80.0–100.0)
Platelets: 160 10*3/uL (ref 150–400)
RBC: 3.73 MIL/uL — ABNORMAL LOW (ref 3.87–5.11)
RDW: 14.9 % (ref 11.5–15.5)
WBC: 6 10*3/uL (ref 4.0–10.5)
nRBC: 0 % (ref 0.0–0.2)

## 2019-10-22 MED ORDER — FAMOTIDINE 20 MG PO TABS: 20.0000 mg | ORAL_TABLET | Freq: Every day | ORAL | 0 refills | Status: DC

## 2019-10-22 MED ORDER — AMOXICILLIN-POT CLAVULANATE 875-125 MG PO TABS
1.0000 | ORAL_TABLET | Freq: Two times a day (BID) | ORAL | 0 refills | Status: AC
Start: 2019-10-22 — End: 2019-10-28

## 2019-10-22 MED ORDER — AMOXICILLIN-POT CLAVULANATE 875-125 MG PO TABS
1.0000 | ORAL_TABLET | Freq: Two times a day (BID) | ORAL | 0 refills | Status: DC
Start: 2019-10-22 — End: 2019-10-22

## 2019-10-22 NOTE — Progress Notes (Signed)
Pt d/c to home via EMS. VSS. Education complete. All belongings sent with pt. IVs removed intact.

## 2019-10-22 NOTE — Discharge Instructions (Signed)
Sepsis, Diagnosis, Adult Sepsis is a serious bodily reaction to an infection. The infection that triggers sepsis may be from a bacteria, virus, or fungus. Sepsis can result from an infection in any part of your body. Infections that commonly lead to sepsis include skin, lung, and urinary tract infections. Sepsis is a medical emergency that must be treated right away in a hospital. In severe cases, it can lead to septic shock. Septic shock can weaken your heart and cause your blood pressure to drop. This can cause your central nervous system and your body's organs to stop working. What are the causes? This condition is caused by a severe reaction to infections from bacteria, viruses, or fungus. The germs that most often lead to sepsis include:  Escherichia coli (E. coli) bacteria.  Staphylococcus aureus (staph) bacteria.  Some types of Streptococcus bacteria. The most common infections affect these organs:  The lung (pneumonia).  The kidneys or bladder (urinary tract infection).  The skin (cellulitis).  The bowel, gallbladder, or pancreas. What increases the risk? You are more likely to develop this condition if:  Your body's disease-fighting system (immune system) is weakened.  You are age 65 or older.  You are female.  You had surgery or you have been hospitalized.  You have these devices inserted into your body: ? A small, thin tube (catheter). ? IV line. ? Breathing tube. ? Drainage tube.  You are not getting enough nutrients from food (malnourished).  You have a long-term (chronic) disease, such as cancer, lung disease, kidney disease, or diabetes.  You are African American. What are the signs or symptoms? Symptoms of this condition may include:  Fever.  Chills or feeling very cold.  Confusion or anxiety.  Fatigue.  Muscle aches.  Shortness of breath.  Nausea and vomiting.  Urinating much less than usual.  Fast heart rate (tachycardia).  Rapid  breathing (hyperventilation).  Changes in skin color. Your skin may look blotchy, pale, or blue.  Cool, clammy, or sweaty skin.  Skin rash. Other symptoms depend on the source of your infection. How is this diagnosed? This condition is diagnosed based on:  Your symptoms.  Your medical history.  A physical exam. Other tests may also be done to find out the cause of the infection and how severe the sepsis is. These tests may include:  Blood tests.  Urine tests.  Swabs from other areas of your body that may have an infection. These samples may be tested (cultured) to find out what type of bacteria is causing the infection.  Chest X-ray to check for pneumonia. Other imaging tests, such as a CT scan, may also be done.  Lumbar puncture. This removes a small amount of the fluid that surrounds your brain and spinal cord. The fluid is then examined for infection. How is this treated? This condition must be treated in a hospital. Based on the cause of your infection, you may be given an antibiotic, antiviral, or antifungal medicine. You may also receive:  Fluids through an IV.  Oxygen and breathing assistance.  Medicines to increase your blood pressure.  Kidney dialysis. This process cleans your blood if your kidneys have failed.  Surgery to remove infected tissue.  Blood transfusion if needed.  Medicine to prevent blood clots.  Nutrients to correct imbalances in basic body function (metabolism). You may: ? Receive important salts and minerals (electrolytes) through an IV. ? Have your blood sugar level adjusted. Follow these instructions at home: Medicines   Take over-the-counter and   prescription medicines only as told by your health care provider.  If you were prescribed an antibiotic, antiviral, or antifungal medicine, take it as told by your health care provider. Do not stop taking the medicine even if you start to feel better. General instructions  If you have a  catheter or other indwelling device, ask to have it removed as soon as possible.  Keep all follow-up visits as told by your health care provider. This is important. Contact a health care provider if:  You do not feel like you are getting better or regaining strength.  You are having trouble coping with your recovery.  You frequently feel tired.  You feel worse or do not seem to get better after surgery.  You think you may have an infection after surgery. Get help right away if:  You have any symptoms of sepsis.  You have difficulty breathing.  You have a rapid or skipping heartbeat.  You become confused or disoriented.  You have a high fever.  Your skin becomes blotchy, pale, or blue.  You have an infection that is getting worse or not getting better. These symptoms may represent a serious problem that is an emergency. Do not wait to see if the symptoms will go away. Get medical help right away. Call your local emergency services (911 in the [place] Do not drive yourself to the hospital. Summary  Sepsis is a medical emergency that requires immediate treatment in a hospital.  This condition is caused by a severe reaction to infections from bacteria, viruses, or fungus.  Based on the cause of your infection, you may be given an antibiotic, antiviral, or antifungal medicine.  Treatment may also include IV fluids, breathing assistance, and kidney dialysis. This information is not intended to replace advice given to you by your health care provider. Make sure you discuss any questions you have with your health care provider. Document Revised: 11/21/2017 Document Reviewed: 11/21/2017 [employer] Patient Education  2020 [employer]  

## 2019-10-22 NOTE — Discharge Summary (Signed)
[facility] NAME: [name]    MR#:  [id number]  DATE OF BIRTH:  [birth year]  DATE OF ADMISSION:  10/17/2019 ADMITTING PHYSICIAN: Wyvonnia Dusky, MD  DATE OF DISCHARGE: 10/22/2019  PRIMARY CARE PHYSICIAN: Kirk Ruths, MD    ADMISSION DIAGNOSIS:  UTI (urinary tract infection) [N39.0] Sepsis secondary to UTI ([place]) [A41.9, N39.0] Sepsis due to urinary tract infection ([place]) [A41.9, N39.0] Sepsis ([place]) [A41.9]  DISCHARGE DIAGNOSIS:  Principal Problem:   Sepsis secondary to UTI [facility]) Active Problems:   COPD with bronchial hyperresponsiveness ([place])   Major depressive disorder, recurrent, in partial remission ([place])   Essential hypertension   Current tobacco use   Obstructive sleep apnea of adult   CKD (chronic kidney disease) stage 3, GFR 30-59 ml/min   Altered mental status   UTI (urinary tract infection)   Sepsis (HCC)   Acute kidney injury superimposed on CKD (HCC)   Weakness   Hypokalemia   Hyperlipidemia   SECONDARY DIAGNOSIS:   Past Medical History:  Diagnosis Date  . Abdominal aortic atherosclerosis ([place]) 11/11/2016  . ADHD   . Anxiety   . COPD (chronic obstructive pulmonary disease) ([place])   . Depression    major depressive  . Dyspnea    doe  . Edema    left leg  . Follicular lymphoma ([place])    B Cell  . Follicular lymphoma grade II ([place])   . Hypertension   . Hypotension    idiopathic  . Kyphoscoliosis and scoliosis 11/26/2011  . Morbid obesity ([place]) 01/05/2016  . Multiple sclerosis ([place])   . Multiple sclerosis ([place])    1980's  . Neuromuscular disorder ([place])   . Obstructive and reflux uropathy    foley  . Pain    atypical facial  . Peripheral vascular disease of lower extremity with ulceration ([place]) 11/08/2015  . Skin ulcer ([place]) 11/08/2015  . Weakness    generalized. has MS    HOSPITAL COURSE:   1.  Clinical sepsis present on admission with acute kidney injury.  Group B streptococcus  growing in first set of blood cultures.  Repeat blood cultures are negative.  Likely cellulitis of the right thigh the culprit.  Patient suprapubic catheter was changed in the emergency room.  Patient was given Unasyn here in the hospital and give another 11 doses of Augmentin upon discharge home 2.  Cellulitis right lower extremity could be secondary to cat scratch.  She was given Unasyn. 3.  Acute kidney injury on chronic kidney disease stage II.  Creatinine peaked at 1.34.  Creatinine came down to 0.89 upon discharge. 4.  Hypokalemia.  Potassium replaced orally during the hospital course. 5.  Essential hypertension on amlodipine and lisinopril restarted. 6.  Neuropathy on gabapentin 7.  Weakness.  Physical therapy recommended rehab but patient wanted to go home with home health.  Patient did a little bit better today than she had been previously. 8.  History of MS 9.  Stage I decubitus sacrum with starting of stage II in the middle.  Keep on rotating positions.  Present on admission.  DISCHARGE CONDITIONS:   Satisfactory  CONSULTS OBTAINED:  Infectious disease  DRUG ALLERGIES:  No Known Allergies  DISCHARGE MEDICATIONS:   Allergies as of 10/22/2019   No Known Allergies     Medication List    STOP taking these medications   hydrALAZINE 50 MG tablet Commonly known as: APRESOLINE   tiZANidine 4 MG tablet Commonly  known as: ZANAFLEX     TAKE these medications   acetaminophen 325 MG tablet Commonly known as: TYLENOL Take 2 tablets (650 mg total) by mouth every 6 (six) hours as needed for mild pain (or Fever >/= 101).   albuterol 108 (90 Base) MCG/ACT inhaler Commonly known as: VENTOLIN HFA Inhale 1 puff into the lungs every 4 (four) hours as needed for shortness of breath.   amLODipine 5 MG tablet Commonly known as: NORVASC Take 5 mg by mouth daily.   amoxicillin-clavulanate 875-125 MG tablet Commonly known as: Augmentin Take 1 tablet by mouth every 12 (twelve) hours  for 11 doses.   atorvastatin 10 MG tablet Commonly known as: LIPITOR Take 10 mg by mouth daily.   Aubagio 14 MG Tabs Generic drug: Teriflunomide Take 14 mg by mouth daily.   baclofen 10 MG tablet Commonly known as: LIORESAL Take 10 mg by mouth 3 (three) times daily.   buPROPion 300 MG 24 hr tablet Commonly known as: WELLBUTRIN XL Take 300 mg by mouth daily.   clonazePAM 1 MG tablet Commonly known as: KLONOPIN Take 0.5 tablets (0.5 mg total) by mouth at bedtime. What changed:  how much to take when to take this reasons to take this   docusate sodium 100 MG capsule Commonly known as: COLACE Take 1 capsule (100 mg total) by mouth 2 (two) times daily. What changed:  when to take this reasons to take this   DULoxetine 60 MG capsule Commonly known as: CYMBALTA Take 1 capsule (60 mg total) by mouth every morning.   famotidine 20 MG tablet Commonly known as: PEPCID Take 1 tablet (20 mg total) by mouth daily. Start taking on: October 23, 2019   gabapentin 600 MG tablet Commonly known as: NEURONTIN Take 1 tablet (600 mg total) by mouth 3 (three) times daily.   hydrocerin Crea Apply 1 application topically 2 (two) times daily.   interferon beta-1a 30 MCG/0.5ML Pskt injection Commonly known as: AVONEX Inject 30 mcg into the muscle every Monday.   lisinopril 20 MG tablet Commonly known as: ZESTRIL Take 20 mg by mouth daily.   magnesium oxide 400 (241.3 Mg) MG tablet Commonly known as: MAG-OX Take 1 tablet (400 mg total) by mouth daily.   multivitamin with minerals Tabs tablet Take 1 tablet by mouth every evening.   Myrbetriq 50 MG Tb24 tablet Generic drug: mirabegron ER TAKE ONE TABLET BY MOUTH ONCE DAILY What changed:  how much to take how to take this when to take this additional instructions   polyethylene glycol 17 g packet Commonly known as: MIRALAX / GLYCOLAX Take 17 g by mouth daily as needed for mild constipation.   QUEtiapine Fumarate 150 MG 24 hr  tablet Commonly known as: SEROQUEL XR Take 150 mg by mouth at bedtime.   Symbicort 160-4.5 MCG/ACT inhaler Generic drug: budesonide-formoterol Inhale 2 puffs into the lungs 2 (two) times daily.   vitamin C 500 MG tablet Commonly known as: ASCORBIC ACID Take 500 mg by mouth every evening.        DISCHARGE INSTRUCTIONS:   Follow-up PMD 5 days   If you experience worsening of your admission symptoms, develop shortness of breath, life threatening emergency, suicidal or homicidal thoughts you must seek medical attention immediately by calling 911 or calling your MD immediately  if symptoms less severe.  You Must read complete instructions/literature along with all the possible adverse reactions/side effects for all the Medicines you take and that have been prescribed to you. Take  any new Medicines after you have completely understood and accept all the possible adverse reactions/side effects.   Please note  You were cared for by a hospitalist during your hospital stay. If you have any questions about your discharge medications or the care you received while you were in the hospital after you are discharged, you can call the unit and asked to speak with the hospitalist on call if the hospitalist that took care of you is not available. Once you are discharged, your primary care physician will handle any further medical issues. Please note that NO REFILLS for any discharge medications will be authorized once you are discharged, as it is imperative that you return to your primary care physician (or establish a relationship with a primary care physician if you do not have one) for your aftercare needs so that they can reassess your need for medications and monitor your lab values.    Today   CHIEF COMPLAINT:   Chief Complaint  Patient presents with  . Weakness    HISTORY OF PRESENT ILLNESS:  [name]  is a 69 y.o. female came in with weakness and found to have group B streptococcal  sepsis.   VITAL SIGNS:  Blood pressure (!) 151/61, pulse 81, temperature 98.7 F (37.1 C), temperature source Oral, resp. rate 18, height 5\' 6"  (1.676 m), weight 133 kg, SpO2 97 %.   PHYSICAL EXAMINATION:  GENERAL:  69 y.o.-year-old patient lying in the bed with no acute distress.  EYES: Pupils equal, round, reactive to light and accommodation. No scleral icterus. HEENT: Head atraumatic, normocephalic. Oropharynx and nasopharynx clear.  LUNGS: Normal breath sounds bilaterally, no wheezing, rales,rhonchi or crepitation. No use of accessory muscles of respiration.  CARDIOVASCULAR: S1, S2 normal. No murmurs, rubs, or gallops.  ABDOMEN: Soft, non-tender. EXTREMITIES: Trace pedal edema.  NEUROLOGIC: Cranial nerves II through XII are intact. Muscle strength 5/5 in all extremities. Sensation intact. Gait not checked.  PSYCHIATRIC: The patient is alert and oriented x 3.  SKIN: I noticed stage I decubiti on buttock.  Nurse stated that starting of a stage II.  Right thigh does have healing cat scratches.  No further signs of cellulitis.  DATA REVIEW:   CBC Recent Labs  Lab 10/22/19 0435  WBC 6.0  HGB 10.6*  HCT 31.6*  PLT 160    Chemistries  Recent Labs  Lab 10/18/19 0703 10/19/19 0604 10/22/19 0435  NA 134*   < > 143  K 3.7   < > 3.3*  CL 98   < > 106  CO2 26   < > 28  GLUCOSE 110*   < > 103*  BUN 16   < > 11  CREATININE 1.34*   < > 0.89  CALCIUM 8.5*   < > 8.6*  AST 21  --   --   ALT 16  --   --   ALKPHOS 56  --   --   BILITOT 0.7  --   --    < > = values in this interval not displayed.    Microbiology Results  Results for orders placed or performed during the hospital encounter of 10/17/19  Urine culture     Status: Abnormal   Collection Time: 10/17/19  3:53 PM   Specimen: Urine, Random  Result Value Ref Range Status   Specimen Description   Final    URINE, RANDOM Performed at [facility], [address], [place], [place] [address]    Special  Requests  Final    NONE Performed at [facility], [address] [place], [place] [address]    Culture (A)  Final    >=100,000 COLONIES/mL MULTIPLE SPECIES PRESENT, SUGGEST RECOLLECTION   Report Status 10/19/2019 FINAL  Final  Blood culture (routine x 2)     Status: Abnormal   Collection Time: 10/17/19  3:54 PM   Specimen: BLOOD  Result Value Ref Range Status   Specimen Description   Final    BLOOD RIGHT ANTECUBITAL Performed at [facility], [address], [place], [place] [address]    Special Requests   Final    BOTTLES DRAWN AEROBIC AND ANAEROBIC Blood Culture results may not be optimal due to an inadequate volume of blood received in culture bottles Performed at [facility], [address] [place], [place] [address]    Culture  Setup Time   Final    ANAEROBIC BOTTLE ONLY GRAM POSITIVE COCCI CRITICAL RESULT CALLED TO, READ BACK BY AND VERIFIED WITH: DAVID BESANTI AT 0425 ON 10/18/19 RWW Performed at [facility], [place] [address] [place], [place] [address]    Culture GROUP B STREP(S.AGALACTIAE)ISOLATED (A)  Final   Report Status 10/20/2019 FINAL  Final   Organism ID, Bacteria GROUP B STREP(S.AGALACTIAE)ISOLATED  Final      Susceptibility   Group b strep(s.agalactiae)isolated - MIC*    CLINDAMYCIN >=1 RESISTANT Resistant     AMPICILLIN <=0.25 SENSITIVE Sensitive     ERYTHROMYCIN >=8 RESISTANT Resistant     VANCOMYCIN 0.5 SENSITIVE Sensitive     CEFTRIAXONE <=0.12 SENSITIVE Sensitive     LEVOFLOXACIN 1 SENSITIVE Sensitive     * GROUP B STREP(S.AGALACTIAE)ISOLATED  Blood Culture ID Panel (Reflexed)     Status: Abnormal   Collection Time: 10/17/19  3:54 PM  Result Value Ref Range Status   Enterococcus species NOT DETECTED NOT DETECTED Final   Listeria monocytogenes NOT DETECTED NOT DETECTED Final   Staphylococcus species NOT DETECTED NOT DETECTED Final   Staphylococcus aureus (BCID) NOT DETECTED NOT DETECTED Final   Streptococcus  species DETECTED (A) NOT DETECTED Final    Comment: CRITICAL RESULT CALLED TO, READ BACK BY AND VERIFIED WITH: DAVID BESANTI AT 0425 ON 10/18/19 RWW    Streptococcus agalactiae DETECTED (A) NOT DETECTED Final    Comment: CRITICAL RESULT CALLED TO, READ BACK BY AND VERIFIED WITH: DAVID BESANTI AT 0425 ON 10/18/19 RWW    Streptococcus pneumoniae NOT DETECTED NOT DETECTED Final   Streptococcus pyogenes NOT DETECTED NOT DETECTED Final   Acinetobacter baumannii NOT DETECTED NOT DETECTED Final   Enterobacteriaceae species NOT DETECTED NOT DETECTED Final   Enterobacter cloacae complex NOT DETECTED NOT DETECTED Final   Escherichia coli NOT DETECTED NOT DETECTED Final   Klebsiella oxytoca NOT DETECTED NOT DETECTED Final   Klebsiella pneumoniae NOT DETECTED NOT DETECTED Final   Proteus species NOT DETECTED NOT DETECTED Final   Serratia marcescens NOT DETECTED NOT DETECTED Final   Haemophilus influenzae NOT DETECTED NOT DETECTED Final   Neisseria meningitidis NOT DETECTED NOT DETECTED Final   Pseudomonas aeruginosa NOT DETECTED NOT DETECTED Final   Candida albicans NOT DETECTED NOT DETECTED Final   Candida glabrata NOT DETECTED NOT DETECTED Final   Candida krusei NOT DETECTED NOT DETECTED Final   Candida parapsilosis NOT DETECTED NOT DETECTED Final   Candida tropicalis NOT DETECTED NOT DETECTED Final    Comment: Performed at [facility], [address] [place], [place] [address]  Blood culture (routine x 2)  Status: Abnormal   Collection Time: 10/17/19  4:36 PM   Specimen: BLOOD  Result Value Ref Range Status   Specimen Description   Final    BLOOD BLOOD LEFT ARM Performed at [facility], [address], [place], [place] [address]    Special Requests   Final    BOTTLES DRAWN AEROBIC AND ANAEROBIC Blood Culture adequate volume Performed at [facility], [address] [place], [place] [address]    Culture  Setup Time   Final    GRAM POSITIVE COCCI IN  BOTH AEROBIC AND ANAEROBIC BOTTLES CRITICAL RESULT CALLED TO, READ BACK BY AND VERIFIED WITH: [place] ON 10/18/19 RWW Performed at [facility], [address] [place], [place] [address]    Culture (A)  Final    GROUP B STREP(S.AGALACTIAE)ISOLATED SUSCEPTIBILITIES PERFORMED ON PREVIOUS CULTURE WITHIN THE LAST 5 DAYS. Performed at [facility], [place] [address] [place], [place] [address]    Report Status 10/20/2019 FINAL  Final  SARS Coronavirus 2 by RT PCR (hospital order, performed in [facility] hospital lab) Nasopharyngeal Nasopharyngeal Swab     Status: None   Collection Time: 10/17/19  7:22 PM   Specimen: Nasopharyngeal Swab  Result Value Ref Range Status   SARS Coronavirus 2 NEGATIVE NEGATIVE Final    Comment: (NOTE) SARS-CoV-2 target nucleic acids are NOT DETECTED.  The SARS-CoV-2 RNA is generally detectable in upper and lower respiratory specimens during the acute phase of infection. The lowest concentration of SARS-CoV-2 viral copies this assay can detect is 250 copies / mL. A negative result does not preclude SARS-CoV-2 infection and should not be used as the sole basis for treatment or other patient management decisions.  A negative result may occur with improper specimen collection / handling, submission of specimen other than nasopharyngeal swab, presence of viral mutation(s) within the areas targeted by this assay, and inadequate number of viral copies (<250 copies / mL). A negative result must be combined with clinical observations, patient history, and epidemiological information.  Fact Sheet for Patients:   StrictlyIdeas.no  Fact Sheet for Healthcare Providers: BankingDealers.co.za  This test is not yet approved or  cleared by the [place] FDA and has been authorized for detection and/or diagnosis of SARS-CoV-2 by FDA under an Emergency Use Authorization (EUA).  This EUA will  remain in effect (meaning this test can be used) for the duration of the COVID-19 declaration under Section 564(b)(1) of the Act, 21 U.S.C. section 360bbb-3(b)(1), unless the authorization is terminated or revoked sooner.  Performed at [facility], [address] [place], [place] [address]   CULTURE, BLOOD (ROUTINE X 2) w Reflex to ID Panel     Status: None (Preliminary result)   Collection Time: 10/20/19  8:20 AM   Specimen: BLOOD  Result Value Ref Range Status   Specimen Description BLOOD RIGHT ASSIST CONTROL  Final   Special Requests   Final    BOTTLES DRAWN AEROBIC AND ANAEROBIC Blood Culture adequate volume   Culture   Final    NO GROWTH 2 DAYS Performed at [facility], [address], [place], [place] [address]    Report Status PENDING  Incomplete  CULTURE, BLOOD (ROUTINE X 2) w Reflex to ID Panel     Status: None (Preliminary result)   Collection Time: 10/20/19  8:29 AM   Specimen: BLOOD  Result Value Ref Range Status   Specimen Description BLOOD RIGHT HAND  Final   Special  Requests   Final    BOTTLES DRAWN AEROBIC AND ANAEROBIC Blood Culture results may not be optimal due to an inadequate volume of blood received in culture bottles   Culture   Final    NO GROWTH 2 DAYS Performed at [facility], [address], [place], [place] [address]    Report Status PENDING  Incomplete     Management plans discussed with the patient, family and they are in agreement.  CODE STATUS:     Code Status Orders  (From admission, onward)         Start     Ordered   10/17/19 1928  Full code  Continuous        10/17/19 1927        Code Status History    Date Active Date Inactive Code Status Order ID Comments User Context   06/20/2019 1654 06/23/2019 0206 Full Code [id number]  Para Skeans, MD ED   04/20/2019 2116 04/24/2019 1710 Full Code [id number]  Mansy, Arvella Merles, MD ED   02/02/2019 1553 02/09/2019 2027 Full Code [id number]  [name], MD ED    01/05/2019 0647 01/08/2019 2202 Full Code [id number]  Harrie Foreman, MD ED   06/11/2018 2158 06/14/2018 1807 Full Code [id number]  Saundra Shelling, MD Inpatient   07/07/2017 2000 07/08/2017 1631 Full Code [id number]  Dustin Flock, MD Inpatient   05/14/2017 0152 05/15/2017 1953 Full Code [id number]  Lance Coon, MD Inpatient   04/01/2017 1723 04/05/2017 1742 Full Code [id number]  Daylene Posey, RN Inpatient   02/15/2017 1715 02/17/2017 2008 Full Code [id number]  Henreitta Leber, MD Inpatient   02/06/2017 2356 02/09/2017 1858 Full Code [id number]  Dustin Flock, MD Inpatient   01/02/2017 1903 01/06/2017 1848 Full Code [id number]  [name], MD ED   12/29/2016 1252 12/30/2016 1722 Full Code [id number]  Nicholes Mango, MD Inpatient   10/11/2016 2320 10/14/2016 1726 Full Code [id number]  Theodoro Grist, MD Inpatient   08/28/2016 1159 08/30/2016 1932 Full Code [id number]  Loletha Grayer, MD ED   12/18/2015 1956 12/19/2015 1939 Full Code [id number]  Hower, Aaron Mose, MD ED   Advance Care Planning Activity      TOTAL TIME TAKING CARE OF THIS PATIENT: 35 minutes.    Loletha Grayer M.D on 10/22/2019 at 3:50 PM  Between 7am to 6pm - Pager - [phone]  After 6pm go to [email or web address] - password [email or web address]  [facility]  CC: Primary care physician; Kirk Ruths, MD

## 2019-10-22 NOTE — Care Management Important Message (Signed)
Important Message  Patient Details  Name: [name] MRN: [id number] Date of Birth: Jan 12, 1951   Medicare Important Message Given:  Yes     Juliann Pulse A Coner Gibbard 10/22/2019, 9:46 AM

## 2019-10-22 NOTE — Progress Notes (Signed)
Occupational Therapy Treatment Patient Details Name: [name] MRN: [id number] DOB: 1951/03/23 Today's Date: 10/22/2019    History of present illness presented to ER secondary to fever/chills; admitted for management of sepsis related to UTI   OT comments  Pt seen for OT tx this date to f/u re: safety with ADLs/ADL transfers and fxl mobility. Pt demo'ed improved transfers, tolerance and strength this date with OT. Pt in bed at start of session. Pt able to complete sup to sit with use of bed rails with increased time and MOD I. Pt demos G static sitting balance. OT has pt perform sit to stand trial from EOB elevated 4" initially, and pt able to perform w/ CGA with RW. Pt demos F static standing balance with B UEs supported at CGA to sustain balance. Pt takes 6-7 shuffling side steps to chair adjacent. At this time it is noticed that pt's sheets in bed are soiled and pt requiring posterior peri care. OT facilitates second sit to stand with pt from chair which is lower surface and pt still able to complete at [facility] with extended time to push from arm rests. Pt requires UEs to sustain static stand at this time OT has pt trial performing standing posterior peri care at this time and pt is unsuccessful requiring MAX A for thorough completion. Pt able to reach back to sit with good eccentric control with MIN verbal cues for hand placement. Pt left in chair with chair alarm on, all needs in reach, and RN notified on improved transfer status this date. While pt has improved, she still would benefit from continued skilled OT in the inpt setting as she is still requiring increased assistance for ADLs. Pt however, is refusing to go to SNF. HHOT will be necessary to help pt return to optimal functioning should she choose to go home rather than rehab.    Follow Up Recommendations  SNF    Equipment Recommendations  None recommended by OT (pt reports having all necessary equipment)    Recommendations for Other  Services      Precautions / Restrictions Precautions Precautions: Fall Restrictions Weight Bearing Restrictions: No       Mobility Bed Mobility Overal bed mobility: Needs Assistance Bed Mobility: Supine to Sit     Supine to sit: Modified independent (Device/Increase time);HOB elevated        Transfers Overall transfer level: Needs assistance Equipment used: Rolling walker (2 wheeled) Transfers: Sit to/from Stand Sit to Stand: Min guard              Balance Overall balance assessment: Needs assistance Sitting-balance support: No upper extremity supported;Feet supported Sitting balance-Leahy Scale: Good     Standing balance support: Bilateral upper extremity supported Standing balance-Leahy Scale: Fair Standing balance comment: requires UE support                           ADL either performed or assessed with clinical judgement   ADL Overall ADL's : Needs assistance/impaired                     Lower Body Dressing: Maximal assistance;Sitting/lateral leans Lower Body Dressing Details (indicate cue type and reason): to don socks   Toileting- Clothing Manipulation and Hygiene: Maximal assistance;Sit to/from stand Toileting - Clothing Manipulation Details (indicate cue type and reason): CGA to come to stand, pt requires use of UEs to sustain balance with RW, so MAX A for actual peri  care task     Functional mobility during ADLs: Min guard;Rolling walker (very small shuffling steps, 6-7 to pivot to her right from EOB to recliner adjacent)       Vision Patient Visual Report: No change from baseline     Perception     Praxis      Cognition Arousal/Alertness: Awake/alert Behavior During Therapy: WFL for tasks assessed/performed Overall Cognitive Status: Within Functional Limits for tasks assessed                                          Exercises Other Exercises Other Exercises: OT facilitates ed re: importance of  OOB activity to maintain strength. Pt receptive and endorses not being active enough between hospitalizations.   Shoulder Instructions       General Comments      Pertinent Vitals/ Pain       Pain Assessment: No/denies pain  Home Living                                          Prior Functioning/Environment              Frequency  Min 2X/week        Progress Toward Goals  OT Goals(current goals can now be found in the care plan section)  Progress towards OT goals: Progressing toward goals  Acute Rehab OT Goals Patient Stated Goal: to return home OT Goal Formulation: With patient Time For Goal Achievement: 11/03/19 Potential to Achieve Goals: Good  Plan Discharge plan remains appropriate    Co-evaluation                 AM-PAC OT "6 Clicks" Daily Activity     Outcome Measure   Help from another person eating meals?: None Help from another person taking care of personal grooming?: A Little Help from another person toileting, which includes using toliet, bedpan, or urinal?: A Lot Help from another person bathing (including washing, rinsing, drying)?: A Lot Help from another person to put on and taking off regular upper body clothing?: None Help from another person to put on and taking off regular lower body clothing?: A Lot 6 Click Score: 17    End of Session Equipment Utilized During Treatment: Gait belt;Rolling walker  OT Visit Diagnosis: Unsteadiness on feet (R26.81);Other abnormalities of gait and mobility (R26.89);Muscle weakness (generalized) (M62.81);History of falling (Z91.81)   Activity Tolerance Patient tolerated treatment well   Patient Left in chair;with call bell/phone within reach;with chair alarm set   Nurse Communication Mobility status        Time: 3709-6438 OT Time Calculation (min): 38 min  Charges: OT General Charges $OT Visit: 1 Visit OT Treatments $Self Care/Home Management : 23-37 mins $Therapeutic  Activity: 8-22 mins  Gerrianne Scale, Darke, OTR/L ascom [phone] 10/22/19, 2:53 PM

## 2019-10-25 LAB — CULTURE, BLOOD (ROUTINE X 2)
Culture: NO GROWTH
Culture: NO GROWTH
Special Requests: ADEQUATE

## 2019-11-19 ENCOUNTER — Inpatient Hospital Stay
Admission: EM | Admit: 2019-11-19 | Discharge: 2019-11-24 | DRG: 698 | Disposition: A | Discharge status: Home Health | Payer: Medicare Other | Attending: Hospitalist | Admitting: Hospitalist

## 2019-11-19 ENCOUNTER — Other Ambulatory Visit: Payer: Self-pay

## 2019-11-19 ENCOUNTER — Emergency Department: Payer: Medicare Other

## 2019-11-19 DIAGNOSIS — Z87891 Personal history of nicotine dependence: Secondary | ICD-10-CM

## 2019-11-19 DIAGNOSIS — A4151 Sepsis due to Escherichia coli [E. coli]: Secondary | ICD-10-CM | POA: Diagnosis present

## 2019-11-19 DIAGNOSIS — I700 Atherosclerosis of aorta: Secondary | ICD-10-CM | POA: Diagnosis present

## 2019-11-19 DIAGNOSIS — A419 Sepsis, unspecified organism: Secondary | ICD-10-CM | POA: Diagnosis present

## 2019-11-19 DIAGNOSIS — Z66 Do not resuscitate: Secondary | ICD-10-CM | POA: Diagnosis present

## 2019-11-19 DIAGNOSIS — J449 Chronic obstructive pulmonary disease, unspecified: Secondary | ICD-10-CM | POA: Diagnosis present

## 2019-11-19 DIAGNOSIS — Z8249 Family history of ischemic heart disease and other diseases of the circulatory system: Secondary | ICD-10-CM

## 2019-11-19 DIAGNOSIS — N170 Acute kidney failure with tubular necrosis: Secondary | ICD-10-CM | POA: Diagnosis not present

## 2019-11-19 DIAGNOSIS — Z79899 Other long term (current) drug therapy: Secondary | ICD-10-CM

## 2019-11-19 DIAGNOSIS — Y846 Urinary catheterization as the cause of abnormal reaction of the patient, or of later complication, without mention of misadventure at the time of the procedure: Secondary | ICD-10-CM | POA: Diagnosis present

## 2019-11-19 DIAGNOSIS — N312 Flaccid neuropathic bladder, not elsewhere classified: Secondary | ICD-10-CM | POA: Diagnosis present

## 2019-11-19 DIAGNOSIS — R296 Repeated falls: Secondary | ICD-10-CM | POA: Diagnosis present

## 2019-11-19 DIAGNOSIS — T83518A Infection and inflammatory reaction due to other urinary catheter, initial encounter: Principal | ICD-10-CM | POA: Diagnosis present

## 2019-11-19 DIAGNOSIS — Z8572 Personal history of non-Hodgkin lymphomas: Secondary | ICD-10-CM | POA: Diagnosis not present

## 2019-11-19 DIAGNOSIS — F331 Major depressive disorder, recurrent, moderate: Secondary | ICD-10-CM | POA: Diagnosis not present

## 2019-11-19 DIAGNOSIS — K219 Gastro-esophageal reflux disease without esophagitis: Secondary | ICD-10-CM | POA: Diagnosis present

## 2019-11-19 DIAGNOSIS — G35 Multiple sclerosis: Secondary | ICD-10-CM | POA: Diagnosis present

## 2019-11-19 DIAGNOSIS — N319 Neuromuscular dysfunction of bladder, unspecified: Secondary | ICD-10-CM | POA: Diagnosis present

## 2019-11-19 DIAGNOSIS — Z20822 Contact with and (suspected) exposure to covid-19: Secondary | ICD-10-CM | POA: Diagnosis present

## 2019-11-19 DIAGNOSIS — M419 Scoliosis, unspecified: Secondary | ICD-10-CM | POA: Diagnosis present

## 2019-11-19 DIAGNOSIS — I10 Essential (primary) hypertension: Secondary | ICD-10-CM | POA: Diagnosis present

## 2019-11-19 DIAGNOSIS — N390 Urinary tract infection, site not specified: Secondary | ICD-10-CM | POA: Diagnosis present

## 2019-11-19 DIAGNOSIS — Z9851 Tubal ligation status: Secondary | ICD-10-CM | POA: Diagnosis not present

## 2019-11-19 DIAGNOSIS — L98499 Non-pressure chronic ulcer of skin of other sites with unspecified severity: Secondary | ICD-10-CM | POA: Diagnosis present

## 2019-11-19 DIAGNOSIS — F32A Depression, unspecified: Secondary | ICD-10-CM | POA: Diagnosis present

## 2019-11-19 DIAGNOSIS — N179 Acute kidney failure, unspecified: Secondary | ICD-10-CM | POA: Diagnosis present

## 2019-11-19 DIAGNOSIS — R531 Weakness: Secondary | ICD-10-CM | POA: Diagnosis present

## 2019-11-19 DIAGNOSIS — Z6841 Body Mass Index (BMI) 40.0 and over, adult: Secondary | ICD-10-CM | POA: Diagnosis not present

## 2019-11-19 DIAGNOSIS — N183 Chronic kidney disease, stage 3 unspecified: Secondary | ICD-10-CM | POA: Diagnosis present

## 2019-11-19 DIAGNOSIS — Z825 Family history of asthma and other chronic lower respiratory diseases: Secondary | ICD-10-CM

## 2019-11-19 DIAGNOSIS — F419 Anxiety disorder, unspecified: Secondary | ICD-10-CM | POA: Diagnosis present

## 2019-11-19 DIAGNOSIS — Z978 Presence of other specified devices: Secondary | ICD-10-CM | POA: Diagnosis not present

## 2019-11-19 DIAGNOSIS — F909 Attention-deficit hyperactivity disorder, unspecified type: Secondary | ICD-10-CM | POA: Diagnosis present

## 2019-11-19 DIAGNOSIS — R6520 Severe sepsis without septic shock: Secondary | ICD-10-CM | POA: Diagnosis not present

## 2019-11-19 DIAGNOSIS — F329 Major depressive disorder, single episode, unspecified: Secondary | ICD-10-CM | POA: Diagnosis present

## 2019-11-19 DIAGNOSIS — I739 Peripheral vascular disease, unspecified: Secondary | ICD-10-CM | POA: Diagnosis present

## 2019-11-19 DIAGNOSIS — Z7951 Long term (current) use of inhaled steroids: Secondary | ICD-10-CM | POA: Diagnosis not present

## 2019-11-19 DIAGNOSIS — R509 Fever, unspecified: Secondary | ICD-10-CM

## 2019-11-19 LAB — CBC WITH DIFFERENTIAL/PLATELET
Abs Immature Granulocytes: 0.05 10*3/uL (ref 0.00–0.07)
Basophils Absolute: 0.1 10*3/uL (ref 0.0–0.1)
Basophils Relative: 1 %
Eosinophils Absolute: 0.2 10*3/uL (ref 0.0–0.5)
Eosinophils Relative: 2 %
HCT: 38 % (ref 36.0–46.0)
Hemoglobin: 11.9 g/dL — ABNORMAL LOW (ref 12.0–15.0)
Immature Granulocytes: 1 %
Lymphocytes Relative: 4 %
Lymphs Abs: 0.4 10*3/uL — ABNORMAL LOW (ref 0.7–4.0)
MCH: 28.1 pg (ref 26.0–34.0)
MCHC: 31.3 g/dL (ref 30.0–36.0)
MCV: 89.6 fL (ref 80.0–100.0)
Monocytes Absolute: 0.8 10*3/uL (ref 0.1–1.0)
Monocytes Relative: 7 %
Neutro Abs: 9.3 10*3/uL — ABNORMAL HIGH (ref 1.7–7.7)
Neutrophils Relative %: 85 %
Platelets: 137 10*3/uL — ABNORMAL LOW (ref 150–400)
RBC: 4.24 MIL/uL (ref 3.87–5.11)
RDW: 15.1 % (ref 11.5–15.5)
WBC: 10.8 10*3/uL — ABNORMAL HIGH (ref 4.0–10.5)
nRBC: 0 % (ref 0.0–0.2)

## 2019-11-19 LAB — BLOOD CULTURE ID PANEL (REFLEXED)

## 2019-11-19 LAB — URINALYSIS, ROUTINE W REFLEX MICROSCOPIC
Bilirubin Urine: NEGATIVE
Glucose, UA: NEGATIVE mg/dL
Ketones, ur: NEGATIVE mg/dL
Nitrite: NEGATIVE
Protein, ur: 100 mg/dL — AB
Specific Gravity, Urine: 1.011 (ref 1.005–1.030)
WBC, UA: >50 WBC/hpf — ABNORMAL HIGH (ref 0–5)
pH: 6 (ref 5.0–8.0)

## 2019-11-19 LAB — COMPREHENSIVE METABOLIC PANEL
ALT: 15 U/L (ref 0–44)
AST: 18 U/L (ref 15–41)
Albumin: 4 g/dL (ref 3.5–5.0)
Alkaline Phosphatase: 69 U/L (ref 38–126)
Anion gap: 12 (ref 5–15)
BUN: 18 mg/dL (ref 8–23)
CO2: 23 mmol/L (ref 22–32)
Calcium: 9.1 mg/dL (ref 8.9–10.3)
Chloride: 101 mmol/L (ref 98–111)
Creatinine, Ser: 1.52 mg/dL — ABNORMAL HIGH (ref 0.44–1.00)
GFR calc Af Amer: 40 mL/min — ABNORMAL LOW (ref >60)
GFR calc non Af Amer: 35 mL/min — ABNORMAL LOW (ref >60)
Glucose, Bld: 177 mg/dL — ABNORMAL HIGH (ref 70–99)
Potassium: 4.3 mmol/L (ref 3.5–5.1)
Sodium: 136 mmol/L (ref 135–145)
Total Bilirubin: 0.7 mg/dL (ref 0.3–1.2)
Total Protein: 7.6 g/dL (ref 6.5–8.1)

## 2019-11-19 LAB — PROTIME-INR
INR: 1 (ref 0.8–1.2)
Prothrombin Time: 12.4 seconds (ref 11.4–15.2)

## 2019-11-19 LAB — SARS CORONAVIRUS 2 BY RT PCR (HOSPITAL ORDER, PERFORMED IN ~~LOC~~ HOSPITAL LAB): SARS Coronavirus 2: NEGATIVE

## 2019-11-19 LAB — LACTIC ACID, PLASMA
Lactic Acid, Venous: 2.1 mmol/L (ref 0.5–1.9)
Lactic Acid, Venous: 1.2 mmol/L (ref 0.5–1.9)

## 2019-11-19 LAB — APTT: aPTT: 35 seconds (ref 24–36)

## 2019-11-19 MED ORDER — ACETAMINOPHEN 500 MG PO TABS
1000.0000 mg | ORAL_TABLET | Freq: Once | ORAL | Status: AC
  Administered 2019-11-19: 1000 mg via ORAL
  Filled 2019-11-19: qty 2

## 2019-11-19 MED ORDER — POLYETHYLENE GLYCOL 3350 17 G PO PACK: 17.0000 g | PACK | Freq: Every day | ORAL | Status: DC | PRN

## 2019-11-19 MED ORDER — SODIUM CHLORIDE 0.9 % IV SOLN: 2.0000 g | Freq: Once | INTRAVENOUS | Status: DC

## 2019-11-19 MED ORDER — ENOXAPARIN SODIUM 40 MG/0.4ML ~~LOC~~ SOLN
40.0000 mg | SUBCUTANEOUS | Status: DC
  Administered 2019-11-19 – 2019-11-20 (×2): 40 mg via SUBCUTANEOUS
  Filled 2019-11-19 (×2): qty 0.4

## 2019-11-19 MED ORDER — ATORVASTATIN CALCIUM 20 MG PO TABS
10.0000 mg | ORAL_TABLET | Freq: Every day | ORAL | Status: DC
  Administered 2019-11-19 – 2019-11-23 (×5): 10 mg via ORAL
  Filled 2019-11-19 (×5): qty 1

## 2019-11-19 MED ORDER — MOMETASONE FURO-FORMOTEROL FUM 200-5 MCG/ACT IN AERO
2.0000 | INHALATION_SPRAY | Freq: Two times a day (BID) | RESPIRATORY_TRACT | Status: DC
  Administered 2019-11-20 – 2019-11-24 (×9): 2 via RESPIRATORY_TRACT
  Filled 2019-11-19 (×2): qty 8.8

## 2019-11-19 MED ORDER — SODIUM CHLORIDE 0.9 % IV SOLN
1.0000 g | Freq: Two times a day (BID) | INTRAVENOUS | Status: DC
  Administered 2019-11-20 – 2019-11-21 (×4): 1 g via INTRAVENOUS
  Filled 2019-11-19 (×5): qty 1

## 2019-11-19 MED ORDER — ADULT MULTIVITAMIN W/MINERALS CH
1.0000 | ORAL_TABLET | Freq: Every evening | ORAL | Status: DC
  Administered 2019-11-19 – 2019-11-23 (×5): 1 via ORAL
  Filled 2019-11-19 (×5): qty 1

## 2019-11-19 MED ORDER — QUETIAPINE FUMARATE ER 50 MG PO TB24
150.0000 mg | ORAL_TABLET | Freq: Every day | ORAL | Status: DC
  Administered 2019-11-20 – 2019-11-23 (×5): 150 mg via ORAL
  Filled 2019-11-19 (×7): qty 3

## 2019-11-19 MED ORDER — ONDANSETRON HCL 4 MG/2ML IJ SOLN: 4.0000 mg | Freq: Four times a day (QID) | INTRAMUSCULAR | Status: DC | PRN

## 2019-11-19 MED ORDER — DULOXETINE HCL 30 MG PO CPEP
60.0000 mg | ORAL_CAPSULE | Freq: Every day | ORAL | Status: DC
  Administered 2019-11-19 – 2019-11-24 (×6): 60 mg via ORAL
  Filled 2019-11-19 (×7): qty 2

## 2019-11-19 MED ORDER — VANCOMYCIN HCL IN DEXTROSE 1-5 GM/200ML-% IV SOLN
1000.0000 mg | Freq: Once | INTRAVENOUS | Status: AC
  Administered 2019-11-19: 1000 mg via INTRAVENOUS
  Filled 2019-11-19: qty 200

## 2019-11-19 MED ORDER — TERIFLUNOMIDE 14 MG PO TABS: 14.0000 mg | ORAL_TABLET | Freq: Every day | ORAL | Status: DC

## 2019-11-19 MED ORDER — MIRABEGRON ER 50 MG PO TB24
50.0000 mg | ORAL_TABLET | Freq: Every day | ORAL | Status: DC
  Administered 2019-11-19 – 2019-11-24 (×6): 50 mg via ORAL
  Filled 2019-11-19 (×7): qty 1

## 2019-11-19 MED ORDER — ACETAMINOPHEN 325 MG PO TABS
650.0000 mg | ORAL_TABLET | Freq: Four times a day (QID) | ORAL | Status: DC | PRN
  Administered 2019-11-19 – 2019-11-23 (×7): 650 mg via ORAL
  Filled 2019-11-19 (×7): qty 2

## 2019-11-19 MED ORDER — ONDANSETRON HCL 4 MG PO TABS: 4.0000 mg | ORAL_TABLET | Freq: Four times a day (QID) | ORAL | Status: DC | PRN

## 2019-11-19 MED ORDER — BUPROPION HCL ER (XL) 150 MG PO TB24
300.0000 mg | ORAL_TABLET | Freq: Every day | ORAL | Status: DC
  Administered 2019-11-19 – 2019-11-24 (×6): 300 mg via ORAL
  Filled 2019-11-19 (×6): qty 2

## 2019-11-19 MED ORDER — DOCUSATE SODIUM 100 MG PO CAPS
100.0000 mg | ORAL_CAPSULE | Freq: Two times a day (BID) | ORAL | Status: DC
  Administered 2019-11-19 – 2019-11-24 (×7): 100 mg via ORAL
  Filled 2019-11-19 (×9): qty 1

## 2019-11-19 MED ORDER — SODIUM CHLORIDE 0.9 % IV BOLUS
1000.0000 mL | Freq: Once | INTRAVENOUS | Status: AC
  Administered 2019-11-19: 1000 mL via INTRAVENOUS

## 2019-11-19 MED ORDER — CLONAZEPAM 1 MG PO TABS
1.0000 mg | ORAL_TABLET | Freq: Two times a day (BID) | ORAL | Status: DC | PRN
  Filled 2019-11-19: qty 1

## 2019-11-19 MED ORDER — SODIUM CHLORIDE 0.9 % IV SOLN: INTRAVENOUS | Status: DC

## 2019-11-19 MED ORDER — FAMOTIDINE 20 MG PO TABS
20.0000 mg | ORAL_TABLET | Freq: Every day | ORAL | Status: DC
  Administered 2019-11-19 – 2019-11-24 (×6): 20 mg via ORAL
  Filled 2019-11-19 (×6): qty 1

## 2019-11-19 MED ORDER — HYDROCERIN EX CREA
1.0000 "application " | TOPICAL_CREAM | Freq: Two times a day (BID) | CUTANEOUS | Status: DC
  Administered 2019-11-20 – 2019-11-24 (×10): 1 via TOPICAL
  Filled 2019-11-19 (×2): qty 113

## 2019-11-19 MED ORDER — GABAPENTIN 600 MG PO TABS
600.0000 mg | ORAL_TABLET | Freq: Three times a day (TID) | ORAL | Status: DC
  Administered 2019-11-19 – 2019-11-24 (×15): 600 mg via ORAL
  Filled 2019-11-19 (×15): qty 1

## 2019-11-19 MED ORDER — AMLODIPINE BESYLATE 5 MG PO TABS: 5.0000 mg | ORAL_TABLET | Freq: Every day | ORAL | Status: DC

## 2019-11-19 MED ORDER — SODIUM CHLORIDE 0.9 % IV SOLN
2.0000 g | Freq: Once | INTRAVENOUS | Status: AC
  Administered 2019-11-19: 2 g via INTRAVENOUS
  Filled 2019-11-19: qty 2

## 2019-11-19 MED ORDER — SODIUM CHLORIDE 0.9 % IV SOLN
2.0000 g | Freq: Two times a day (BID) | INTRAVENOUS | Status: DC
  Filled 2019-11-19 (×2): qty 2

## 2019-11-19 MED ORDER — ASCORBIC ACID 500 MG PO TABS
500.0000 mg | ORAL_TABLET | Freq: Every evening | ORAL | Status: DC
  Administered 2019-11-19 – 2019-11-23 (×5): 500 mg via ORAL
  Filled 2019-11-19 (×5): qty 1

## 2019-11-19 MED ORDER — METRONIDAZOLE IN NACL 5-0.79 MG/ML-% IV SOLN
500.0000 mg | Freq: Once | INTRAVENOUS | Status: AC
  Administered 2019-11-19: 500 mg via INTRAVENOUS
  Filled 2019-11-19: qty 100

## 2019-11-19 MED ORDER — BACLOFEN 10 MG PO TABS
10.0000 mg | ORAL_TABLET | Freq: Three times a day (TID) | ORAL | Status: DC
  Administered 2019-11-19 – 2019-11-24 (×16): 10 mg via ORAL
  Filled 2019-11-19 (×19): qty 1

## 2019-11-19 MED ORDER — MAGNESIUM OXIDE 400 (241.3 MG) MG PO TABS
400.0000 mg | ORAL_TABLET | Freq: Every day | ORAL | Status: DC
  Administered 2019-11-19 – 2019-11-24 (×6): 400 mg via ORAL
  Filled 2019-11-19 (×6): qty 1

## 2019-11-19 NOTE — ED Notes (Signed)
Pt resting quietly, assisted to change positions for comfort

## 2019-11-19 NOTE — ED Notes (Signed)
Report given to chris RN.

## 2019-11-19 NOTE — Consult Note (Signed)
Pharmacy Antibiotic Note  [name] is a 69 y.o. female with multiple sclerosis , history of B cell lymphoma, CKD stage III, neurogenic bladder with chronic indwelling foley admitted on 11/19/2019 with UTI. She was recently admitted 6/20 - 6/25 with Group B Streptococcus bacteremia secondary to cellulitis. She does have a history of infection with SPACE organisms. She is on teriflunomide for multiple sclerosis when can cause immunosuppression. Pharmacy has been consulted for cefepime dosing.  Plan: Cefepime 2 g q12h (renally adjusted)  Height: 5\' 4"  (162.6 cm) Weight: (!) 124.7 kg (275 lb) IBW/kg (Calculated) : 54.7  Temp (24hrs), Avg:100.6 F (38.1 C), Min:98.8 F (37.1 C), Max:102.3 F (39.1 C)  Recent Labs  Lab 11/19/19 0912 11/19/19 1127  WBC 10.8*  --   CREATININE 1.52*  --   LATICACIDVEN 2.1* 1.2    Estimated Creatinine Clearance: 46.2 mL/min (A) (by C-G formula based on SCr of 1.52 mg/dL (H)).    No Known Allergies  Antimicrobials this admission: Vancomycin, cefepime, and metronidazole in the ED 7/23 x 1  Cefepime 7/23 >>   Dose adjustments this admission: n/a  Microbiology results: 7/23 BCx: pending 7/2 UCx: pending   Thank you for allowing pharmacy to be a part of this patient's care.  Benita Gutter 11/19/2019 12:01 PM

## 2019-11-19 NOTE — Progress Notes (Signed)
PHARMACY - PHYSICIAN COMMUNICATION CRITICAL VALUE ALERT - BLOOD CULTURE IDENTIFICATION (BCID)  [name] is an 69 y.o. female who presented to [facility] on 11/19/2019 with a chief complaint of Sepsis  Assessment:  EColi growing in 1 of 4 bottles, KPC negative (include suspected source if known)  Name of physician (or Provider) Contacted: [place]  Current antibiotics: Cefepime 2 gm IV Q12H   Changes to prescribed antibiotics recommended:  Yes, will d/c cefepime and start Meropenem 1 gm IV Q12H.   Results for orders placed or performed during the hospital encounter of 11/19/19  Blood Culture ID Panel (Reflexed) (Collected: 11/19/2019  9:12 AM)  Result Value Ref Range   Enterococcus species NOT DETECTED NOT DETECTED   Listeria monocytogenes NOT DETECTED NOT DETECTED   Staphylococcus species NOT DETECTED NOT DETECTED   Staphylococcus aureus (BCID) NOT DETECTED NOT DETECTED   Streptococcus species NOT DETECTED NOT DETECTED   Streptococcus agalactiae NOT DETECTED NOT DETECTED   Streptococcus pneumoniae NOT DETECTED NOT DETECTED   Streptococcus pyogenes NOT DETECTED NOT DETECTED   Acinetobacter baumannii NOT DETECTED NOT DETECTED   Enterobacteriaceae species DETECTED (A) NOT DETECTED   Enterobacter cloacae complex NOT DETECTED NOT DETECTED   Escherichia coli DETECTED (A) NOT DETECTED   Klebsiella oxytoca NOT DETECTED NOT DETECTED   Klebsiella pneumoniae NOT DETECTED NOT DETECTED   Proteus species NOT DETECTED NOT DETECTED   Serratia marcescens NOT DETECTED NOT DETECTED   Carbapenem resistance NOT DETECTED NOT DETECTED   Haemophilus influenzae NOT DETECTED NOT DETECTED   Neisseria meningitidis NOT DETECTED NOT DETECTED   Pseudomonas aeruginosa NOT DETECTED NOT DETECTED   Candida albicans NOT DETECTED NOT DETECTED   Candida glabrata NOT DETECTED NOT DETECTED   Candida krusei NOT DETECTED NOT DETECTED   Candida parapsilosis NOT DETECTED NOT DETECTED   Candida tropicalis NOT DETECTED  NOT DETECTED    Liyat Faulkenberry D 11/19/2019  9:10 PM

## 2019-11-19 NOTE — Progress Notes (Signed)
Pharmacy Antibiotic Note  [name] is a 69 y.o. female admitted on 11/19/2019 with sepsis.  Pharmacy has been consulted for meropenem dosing.  Plan: Meropenem 1 gm IV Q12H ordered to start on 7/23 @ 2130  Height: 5\' 3"  (160 cm) Weight: (!) 126.5 kg (278 lb 14.4 oz) IBW/kg (Calculated) : 52.4  Temp (24hrs), Avg:100 F (37.8 C), Min:97.6 F (36.4 C), Max:102.3 F (39.1 C)  Recent Labs  Lab 11/19/19 0912 11/19/19 1127  WBC 10.8*  --   CREATININE 1.52*  --   LATICACIDVEN 2.1* 1.2    Estimated Creatinine Clearance: 45.9 mL/min (A) (by C-G formula based on SCr of 1.52 mg/dL (H)).    No Known Allergies  Antimicrobials this admission:   >>    >>   Dose adjustments this admission:   Microbiology results:  BCx:    UCx:    Sputum:    MRSA PCR:  Thank you for allowing pharmacy to be a part of this patient's care.  Letesha Klecker D 11/19/2019 9:12 PM

## 2019-11-19 NOTE — Progress Notes (Signed)
PHARMACY -  BRIEF ANTIBIOTIC NOTE   Pharmacy has received consult(s) for vancomycin and cefepime from an ED provider.  The patient's profile has been reviewed for ht/wt/allergies/indication/available labs.    One time order(s) placed for vanc 1 g + cefepime 2 g  Further antibiotics/pharmacy consults should be ordered by admitting physician if indicated.                       Thank you,  Tawnya Crook, PharmD 11/19/2019  9:46 AM

## 2019-11-19 NOTE — Progress Notes (Signed)
CODE SEPSIS - PHARMACY COMMUNICATION  **Broad Spectrum Antibiotics should be administered within 1 hour of Sepsis diagnosis**  Time Code Sepsis Called/Page Received: 0931  Antibiotics Ordered: vanc/cefepime/metronidazole  Time of 1st antibiotic administration: 1002  Additional action taken by pharmacy: NA  If necessary, Name of Provider/Nurse Contacted: NA    Tawnya Crook ,PharmD Clinical Pharmacist  11/19/2019  10:45 AM

## 2019-11-19 NOTE — ED Notes (Signed)
Pt resting quiely, cont to monitor

## 2019-11-19 NOTE — ED Notes (Signed)
Pt states that she is uncomfortable with her position, will attempt to assist with position of change for comfort

## 2019-11-19 NOTE — H&P (Signed)
History and Physical    [name] KCL:[id number] DOB: 30-Aug-1950 DOA: 11/19/2019  PCP: Kirk Ruths, MD   Patient coming from: Home  I have personally briefly reviewed patient's old medical records in [place]  Chief Complaint: Generalized weakness  HPI: [name] is a 69 y.o. female with medical history significant for multiple sclerosis, neurogenic bladder with chronic indwelling Foley catheter, morbid obesity, hypertension, depression and COPD who presents to the ER for evaluation of generalized weakness which started this morning.  Patient was unable to get out of bed and called EMS.  When they arrived they found patient to be very weak and febrile with a T-max of 102.3 F.  She was brought into the emergency room for further evaluation. Patient denies having any abdominal pain, no nausea, no vomiting, no changes in her bowel habits, no shortness of breath or chest pain. Patient noted to have pyuria.  Creatinine of 1.52 compared to baseline of 0.89, lactic acid of 2.1, white count of 10.8 with a left shift. Chest x-ray reviewed by me shows no acute findings   ED Course: Patient is a 69 year old female with a history of multiple sclerosis and neurogenic bladder with chronic indwelling Foley catheter who was brought in by EMS for evaluation of generalized weakness and fever, T-max of 102.3 F.  Patient is septic and has pyuria, elevated lactic acid level and a normal white cell count to the left shift.  She received IV fluid resuscitation in the ER and a dose of cefepime.  She will be admitted for further evaluation.  Review of Systems: As per HPI otherwise 10 point review of systems negative.   Past Medical History:  Diagnosis Date  . Abdominal aortic atherosclerosis ([place]) 11/11/2016  . ADHD   . Anxiety   . COPD (chronic obstructive pulmonary disease) ([place])   . Depression    major depressive  . Dyspnea    doe  . Edema    left leg  . Follicular lymphoma  ([place])    B Cell  . Follicular lymphoma grade II ([place])   . Hypertension   . Hypotension    idiopathic  . Kyphoscoliosis and scoliosis 11/26/2011  . Morbid obesity ([place]) 01/05/2016  . Multiple sclerosis ([place])   . Multiple sclerosis ([place])    1980's  . Neuromuscular disorder ([place])   . Obstructive and reflux uropathy    foley  . Pain    atypical facial  . Peripheral vascular disease of lower extremity with ulceration ([place]) 11/08/2015  . Skin ulcer ([place]) 11/08/2015  . Weakness    generalized. has MS    Past Surgical History:  Procedure Laterality Date  . BACK SURGERY N/A 2002  . CYST EXCISION     lower back  . INGUINAL LYMPH NODE BIOPSY Left 07/04/2016   Procedure: INGUINAL LYMPH NODE BIOPSY;  Surgeon: Christene Lye, MD;  Location: [facility] ORS;  Service: General;  Laterality: Left;  . PORTACATH PLACEMENT N/A 07/22/2016   Procedure: INSERTION PORT-A-CATH;  Surgeon: Christene Lye, MD;  Location: [facility] ORS;  Service: General;  Laterality: N/A;  . TONSILLECTOMY AND ADENOIDECTOMY    . TUBAL LIGATION       reports that she quit smoking about 3 years ago. Her smoking use included cigarettes. She started smoking about 24 years ago. She has a 20.00 pack-year smoking history. She has never used smokeless tobacco. She reports current drug use. Drug: Marijuana. She reports that she does not drink alcohol.  No Known Allergies  Family History  Problem Relation Age of Onset  . COPD Mother   . Diabetes Mother   . Heart failure Mother   . Alcohol abuse Father   . Kidney disease Father   . Kidney failure Father   . Arthritis Sister   . CAD Maternal Grandmother   . Stroke Maternal Grandfather   . Arthritis Sister   . Mental illness Sister   . Arthritis Brother      Prior to Admission medications   Medication Sig Start Date End Date Taking? Authorizing Provider  amLODipine (NORVASC) 5 MG tablet Take 5 mg by mouth daily. 09/17/19  Yes [provider]  atorvastatin  (LIPITOR) 10 MG tablet Take 10 mg by mouth daily.  01/14/19  Yes [provider]  baclofen (LIORESAL) 10 MG tablet Take 10 mg by mouth 3 (three) times daily.  04/10/19  Yes [provider]  buPROPion (WELLBUTRIN XL) 300 MG 24 hr tablet Take 300 mg by mouth daily.  01/21/19  Yes [provider]  clonazePAM (KLONOPIN) 1 MG tablet Take 0.5 tablets (0.5 mg total) by mouth at bedtime. Patient taking differently: Take 1 mg by mouth 2 (two) times daily as needed.  04/22/19  Yes Lavina Hamman, MD  docusate sodium (COLACE) 100 MG capsule Take 1 capsule (100 mg total) by mouth 2 (two) times daily. Patient taking differently: Take 100 mg by mouth 2 (two) times daily as needed.  01/08/19  Yes Gouru, Illene Silver, MD  DULoxetine (CYMBALTA) 60 MG capsule Take 1 capsule (60 mg total) by mouth every morning. 10/14/16  Yes Vaughan Basta, MD  famotidine (PEPCID) 20 MG tablet Take 1 tablet (20 mg total) by mouth daily. 10/23/19  Yes Wieting, Richard, MD  gabapentin (NEURONTIN) 600 MG tablet Take 1 tablet (600 mg total) by mouth 3 (three) times daily. 02/09/17  Yes Mody, Ulice Bold, MD  hydrALAZINE (APRESOLINE) 50 MG tablet Take 50 mg by mouth 3 (three) times daily. 06/25/19  Yes [provider]  lisinopril (ZESTRIL) 20 MG tablet Take 20 mg by mouth daily. 09/17/19  Yes [provider]  magnesium oxide (MAG-OX) 400 (241.3 Mg) MG tablet Take 1 tablet (400 mg total) by mouth daily. 06/22/19  Yes Nolberto Hanlon, MD  MYRBETRIQ 50 MG TB24 tablet TAKE ONE TABLET BY MOUTH ONCE DAILY Patient taking differently: Take 50 mg by mouth daily.  03/08/19  Yes Stoioff, Ronda Fairly, MD  QUEtiapine Fumarate (SEROQUEL XR) 150 MG 24 hr tablet Take 150 mg by mouth at bedtime.    Yes [provider]    Physical Exam: Vitals:   11/19/19 0910 11/19/19 1030 11/19/19 1106 11/19/19 1205  BP:  (!) 122/53 (!) 122/53 (!) 111/53  Pulse:   76 73  Resp:   18   Temp:   98.8 F (37.1 C)   TempSrc:   Oral    SpO2:   94% 94%  Weight: (!) 124.7 kg     Height: 5\' 4"  (1.626 m)        Vitals:   11/19/19 0910 11/19/19 1030 11/19/19 1106 11/19/19 1205  BP:  (!) 122/53 (!) 122/53 (!) 111/53  Pulse:   76 73  Resp:   18   Temp:   98.8 F (37.1 C)   TempSrc:   Oral   SpO2:   94% 94%  Weight: (!) 124.7 kg     Height: 5\' 4"  (1.626 m)       Constitutional: NAD, alert and oriented  x 3 Eyes: PERRL, lids and conjunctivae pallor ENMT: Mucous membranes are dry.  Neck: normal, supple, no masses, no thyromegaly Respiratory: clear to auscultation bilaterally, no wheezing, no crackles. Normal respiratory effort. No accessory muscle use.  Cardiovascular: Regular rate and rhythm, no murmurs / rubs / gallops. No extremity edema. 2+ pedal pulses. No carotid bruits.  Abdomen: no tenderness, no masses palpated. No hepatosplenomegaly. Bowel sounds positive.  Chronic indwelling Foley catheter Musculoskeletal: no clubbing / cyanosis. No joint deformity upper and lower extremities.  Skin: no rashes, lesions, ulcers.  Neurologic: No gross focal neurologic deficit.  Generalized weakness Psychiatric: Normal mood and affect.   Labs on Admission: I have personally reviewed following labs and imaging studies  CBC: Recent Labs  Lab 11/19/19 0912  WBC 10.8*  NEUTROABS 9.3*  HGB 11.9*  HCT 38.0  MCV 89.6  PLT 382*   Basic Metabolic Panel: Recent Labs  Lab 11/19/19 0912  NA 136  K 4.3  CL 101  CO2 23  GLUCOSE 177*  BUN 18  CREATININE 1.52*  CALCIUM 9.1   GFR: Estimated Creatinine Clearance: 46.2 mL/min (A) (by C-G formula based on SCr of 1.52 mg/dL (H)). Liver Function Tests: Recent Labs  Lab 11/19/19 0912  AST 18  ALT 15  ALKPHOS 69  BILITOT 0.7  PROT 7.6  ALBUMIN 4.0   No results for input(s): LIPASE, AMYLASE in the last 168 hours. No results for input(s): AMMONIA in the last 168 hours. Coagulation Profile: Recent Labs  Lab 11/19/19 0912  INR 1.0   Cardiac Enzymes: No results for  input(s): CKTOTAL, CKMB, CKMBINDEX, TROPONINI in the last 168 hours. BNP (last 3 results) No results for input(s): PROBNP in the last 8760 hours. HbA1C: No results for input(s): HGBA1C in the last 72 hours. CBG: No results for input(s): GLUCAP in the last 168 hours. Lipid Profile: No results for input(s): CHOL, HDL, LDLCALC, TRIG, CHOLHDL, LDLDIRECT in the last 72 hours. Thyroid Function Tests: No results for input(s): TSH, T4TOTAL, FREET4, T3FREE, THYROIDAB in the last 72 hours. Anemia Panel: No results for input(s): VITAMINB12, FOLATE, FERRITIN, TIBC, IRON, RETICCTPCT in the last 72 hours. Urine analysis:    Component Value Date/Time   COLORURINE YELLOW (A) 11/19/2019 0912   APPEARANCEUR TURBID (A) 11/19/2019 0912   APPEARANCEUR Hazy 08/02/2013 0020   LABSPEC 1.011 11/19/2019 0912   LABSPEC 1.012 08/02/2013 0020   PHURINE 6.0 11/19/2019 0912   GLUCOSEU NEGATIVE 11/19/2019 0912   GLUCOSEU Negative 08/02/2013 0020   HGBUR SMALL (A) 11/19/2019 0912   BILIRUBINUR NEGATIVE 11/19/2019 0912   BILIRUBINUR Negative 08/02/2013 0020   KETONESUR NEGATIVE 11/19/2019 0912   PROTEINUR 100 (A) 11/19/2019 0912   NITRITE NEGATIVE 11/19/2019 0912   LEUKOCYTESUR LARGE (A) 11/19/2019 0912   LEUKOCYTESUR 2+ 08/02/2013 0020    Radiological Exams on Admission: DG Chest Port 1 View  Result Date: 11/19/2019 CLINICAL DATA:  Fever. EXAM: PORTABLE CHEST 1 VIEW COMPARISON:  04/20/2019 FINDINGS: The cardiomediastinal silhouette is within normal limits for portable AP technique and slightly low lung volumes. The interstitial markings are chronically slightly prominent without overt edema, airspace consolidation, sizeable pleural effusion, or pneumothorax identified. No acute osseous abnormality is seen. IMPRESSION: No active disease. Electronically Signed   By: Logan Bores M.D.   On: 11/19/2019 09:55    EKG: Independently reviewed.   Assessment/Plan Principal Problem:   Sepsis ([place]) Active  Problems:   Essential hypertension   Neurogenic bladder   Obesity, morbid ([place])   Depression  AKI (acute kidney injury) ([place])   Chronic indwelling Foley catheter    Sepsis from a urinary source (POA) As evidenced by fever with a T-max of 102.3, tachycardia, elevated lactic acid level, normal white cell count with a left shift and pyuria Prior urine culture yields Klebsiella pneumonia We will treat patient empirically with cefepime until culture results become available Aggressive IV fluid resuscitation   Acute kidney injury Patient's baseline serum creatinine is 0.89 and on admission it is 1.85. Hold lisinopril Aggressive IV fluid resuscitation Repeat serum creatinine in a.m.   Morbid obesity (BMI 64.3) Complicates overall prognosis and care   Depression Continue bupropion, clonazepam and Seroquel   History of multiple sclerosis Continue Teriflunomide, Cymbalta and gabapentin  DVT prophylaxis: Lovenox Code Status: DNR Family Communication: Greater than 50% of time was spent discussing plan of care with patient at the bedside.  She verbalizes understanding and agrees with the plan. Disposition Plan: Back to previous home environment Consults called: None    Zahraa Bhargava MD [facility]     11/19/2019, 12:20 PM

## 2019-11-19 NOTE — ED Notes (Signed)
Pt resting quietly, cont to monitor °

## 2019-11-19 NOTE — ED Triage Notes (Signed)
Pt comes to the ER via ems from home, pt called for assistance this am due to weakness, but was found to be febrile as well as weak with a a temp 102.3. pt has an indwelling foley cath for neurogenic bladder due to her MS. Pt also states that when she got out of her chair she saw blood in the seat. No distress noted at this time

## 2019-11-19 NOTE — ED Provider Notes (Signed)
[facility] Emergency Department Provider Note  Time seen: 9:27 AM  I have reviewed the triage vital signs and the nursing notes.   HISTORY  Chief Complaint Weakness, fever  HPI [name] is a 69 y.o. female with a past medical history of COPD, depression, hypertension, obesity, multiple sclerosis, presents to the emergency department for generalized weakness.  According to the patient she woke up this morning with generalized weakness fatigue was unable to get out of bed so she called EMS.  EMS was initially called out to help the patient get out of bed however found the patient to be very weak and febrile so they brought the patient to the emergency department.  Patient is febrile to 102.3 here.  Patient denies any abdominal pain or chest pain.  States occasional cough but states no more than normal.  Denies any sputum production.  Patient has received both of her Covid vaccinations.  Patient has a chronic indwelling Foley catheter.  Patient states she did notice some blood in her depend, but has not noticed any blood in her stool.   Past Medical History:  Diagnosis Date  . Abdominal aortic atherosclerosis ([place]) 11/11/2016  . ADHD   . Anxiety   . COPD (chronic obstructive pulmonary disease) ([place])   . Depression    major depressive  . Dyspnea    doe  . Edema    left leg  . Follicular lymphoma ([place])    B Cell  . Follicular lymphoma grade II ([place])   . Hypertension   . Hypotension    idiopathic  . Kyphoscoliosis and scoliosis 11/26/2011  . Morbid obesity ([place]) 01/05/2016  . Multiple sclerosis ([place])   . Multiple sclerosis ([place])    1980's  . Neuromuscular disorder ([place])   . Obstructive and reflux uropathy    foley  . Pain    atypical facial  . Peripheral vascular disease of lower extremity with ulceration ([place]) 11/08/2015  . Skin ulcer ([place]) 11/08/2015  . Weakness    generalized. has MS    Patient Active Problem List   Diagnosis Date Noted  .  Neuropathy   . Hypokalemia   . Hyperlipidemia   . Sepsis secondary to UTI ([place]) 10/17/2019  . Weakness   . Acute kidney injury superimposed on CKD ([place]) 04/20/2019  . Pressure injury of skin 02/03/2019  . Bilateral lower leg cellulitis 02/02/2019  . Ovarian mass, left 01/27/2019  . Sepsis ([place]) 01/05/2019  . UTI (urinary tract infection) 06/13/2018  . Altered mental status 06/11/2018  . Fall 05/13/2018  . Depression 05/13/2018  . Recurrent cellulitis of lower extremity 06/25/2017  . Medication monitoring encounter 06/05/2017  . Cellulitis of left lower leg 04/25/2017  . Adjustment disorder with mixed disturbance of emotions and conduct 04/23/2017  . Foot pain, bilateral 02/03/2017  . Tinea pedis 02/03/2017  . Left ovarian cyst 12/09/2016  . Abdominal aortic atherosclerosis ([place]) 11/11/2016  . Swelling of lower extremity 10/11/2016  . Obstructive sleep apnea 10/11/2016  . Follicular lymphoma of intra-abdominal lymph nodes ([place]) 08/06/2016  . Multiple falls 06/07/2016  . Inguinal adenopathy 05/29/2016  . Obesity, morbid ([place]) 01/05/2016  . Low HDL (under 40) 12/19/2015  . Cellulitis 12/18/2015  . Skin ulcer ([place]) 11/08/2015  . Peripheral vascular disease of lower extremity with ulceration ([place]) 11/08/2015  . CKD (chronic kidney disease) stage 3, GFR 30-59 ml/min 11/08/2015  . Constipation due to pain medication 01/31/2015  . Obstructive sleep apnea of adult 01/13/2015  .  Pelvic muscle wasting 01/13/2015  . Incomplete bladder emptying 01/13/2015  . Headache, migraine 10/24/2014  . 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 Current tobacco use 10/24/2014  . Lumbar radiculopathy, chronic 10/02/2013  . COPD with bronchial hyperresponsiveness ([place]) 10/02/2013  . Major depressive disorder, recurrent, in partial remission ([place]) 10/02/2013  . Essential hypertension 10/02/2013  . Multiple sclerosis ([place]) 10/02/2013  . Absence of bladder continence 09/25/2012  . Acontractile bladder  02/12/2012  . Narrowing of intervertebral disc space 11/26/2011  . Kyphoscoliosis and scoliosis 11/26/2011    Past Surgical History:  Procedure Laterality Date  . BACK SURGERY N/A 2002  . CYST EXCISION     lower back  . INGUINAL LYMPH NODE BIOPSY Left 07/04/2016   Procedure: INGUINAL LYMPH NODE BIOPSY;  Surgeon: Christene Lye, MD;  Location: [facility] ORS;  Service: General;  Laterality: Left;  . PORTACATH PLACEMENT N/A 07/22/2016   Procedure: INSERTION PORT-A-CATH;  Surgeon: Christene Lye, MD;  Location: [facility] ORS;  Service: General;  Laterality: N/A;  . TONSILLECTOMY AND ADENOIDECTOMY    . TUBAL LIGATION      Prior to Admission medications   Medication Sig Start Date End Date Taking? Authorizing Provider  acetaminophen (TYLENOL) 325 MG tablet Take 2 tablets (650 mg total) by mouth every 6 (six) hours as needed for mild pain (or Fever >/= 101). 01/08/19   Gouru, Illene Silver, MD  albuterol (VENTOLIN HFA) 108 (90 Base) MCG/ACT inhaler Inhale 1 puff into the lungs every 4 (four) hours as needed for shortness of breath. 07/07/18   [provider]  amLODipine (NORVASC) 5 MG tablet Take 5 mg by mouth daily. 09/17/19   [provider]  atorvastatin (LIPITOR) 10 MG tablet Take 10 mg by mouth daily.  01/14/19   [provider]  baclofen (LIORESAL) 10 MG tablet Take 10 mg by mouth 3 (three) times daily. 04/10/19   [provider]  budesonide-formoterol (SYMBICORT) 160-4.5 MCG/ACT inhaler Inhale 2 puffs into the lungs 2 (two) times daily. 07/08/18   [provider]  buPROPion (WELLBUTRIN XL) 300 MG 24 hr tablet Take 300 mg by mouth daily.  01/21/19   [provider]  clonazePAM (KLONOPIN) 1 MG tablet Take 0.5 tablets (0.5 mg total) by mouth at bedtime. Patient taking differently: Take 1 mg by mouth 2 (two) times daily as needed.  04/22/19   Lavina Hamman, MD  docusate sodium (COLACE) 100 MG capsule Take 1 capsule (100 mg total) by mouth 2 (two)  times daily. Patient taking differently: Take 100 mg by mouth 2 (two) times daily as needed.  01/08/19   Gouru, Illene Silver, MD  DULoxetine (CYMBALTA) 60 MG capsule Take 1 capsule (60 mg total) by mouth every morning. 10/14/16   Vaughan Basta, MD  famotidine (PEPCID) 20 MG tablet Take 1 tablet (20 mg total) by mouth daily. 10/23/19   Loletha Grayer, MD  gabapentin (NEURONTIN) 600 MG tablet Take 1 tablet (600 mg total) by mouth 3 (three) times daily. 02/09/17   Bettey Costa, MD  hydrocerin (EUCERIN) CREA Apply 1 application topically 2 (two) times daily. 06/22/19   Nolberto Hanlon, MD  interferon beta-1a (AVONEX) 30 MCG/0.5ML PSKT injection Inject 30 mcg into the muscle every Monday.    [provider]  lisinopril (ZESTRIL) 20 MG tablet Take 20 mg by mouth daily. 09/17/19   [provider]  magnesium oxide (MAG-OX) 400 (241.3 Mg) MG tablet Take 1 tablet (400 mg total) by mouth daily. 06/22/19   Nolberto Hanlon, MD  Multiple  Vitamin (MULTIVITAMIN WITH MINERALS) TABS tablet Take 1 tablet by mouth every evening.     [provider]  MYRBETRIQ 50 MG TB24 tablet TAKE ONE TABLET BY MOUTH ONCE DAILY Patient taking differently: Take 50 mg by mouth daily.  03/08/19   Stoioff, Ronda Fairly, MD  polyethylene glycol (MIRALAX / GLYCOLAX) 17 g packet Take 17 g by mouth daily as needed for mild constipation. 04/22/19   Lavina Hamman, MD  QUEtiapine Fumarate (SEROQUEL XR) 150 MG 24 hr tablet Take 150 mg by mouth at bedtime.     [provider]  Teriflunomide (AUBAGIO) 14 MG TABS Take 14 mg by mouth daily.  12/15/18   [provider]  vitamin C (ASCORBIC ACID) 500 MG tablet Take 500 mg by mouth every evening.     [provider]    No Known Allergies  Family History  Problem Relation Age of Onset  . COPD Mother   . Diabetes Mother   . Heart failure Mother   . Alcohol abuse Father   . Kidney disease Father   . Kidney failure Father   . Arthritis Sister   . CAD  Maternal Grandmother   . Stroke Maternal Grandfather   . Arthritis Sister   . Mental illness Sister   . Arthritis Brother     Social History Social History   Tobacco Use  . Smoking status: Former Smoker    Packs/day: 1.00    Years: 20.00    Pack years: 20.00    Types: Cigarettes    Start date: 04/30/1995    Quit date: 02/03/2016    Years since quitting: 3.7  . Smokeless tobacco: Never Used  Vaping Use  . Vaping Use: Some days  Substance Use Topics  . Alcohol use: No    Alcohol/week: 0.0 standard drinks  . Drug use: Yes    Types: Marijuana    Comment: smokes THC occasionally per pt     Review of Systems Constitutional: Found to be febrile this morning.  Positive for generalized weakness. Cardiovascular: Negative for chest pain. Respiratory: Negative for shortness of breath.  Occasional cough. Gastrointestinal: Negative for abdominal pain, vomiting and diarrhea. Genitourinary: Chronic indwelling Foley catheter. Musculoskeletal: Negative for musculoskeletal complaints Skin: Negative for skin complaints  Neurological: Negative for headache All other ROS negative  ____________________________________________   PHYSICAL EXAM:  VITAL SIGNS: ED Triage Vitals  Enc Vitals Group     BP 11/19/19 0909 (!) 118/52     Pulse Rate 11/19/19 0909 93     Resp 11/19/19 0909 20     Temp 11/19/19 0909 (!) 102.3 F (39.1 C)     Temp Source 11/19/19 0909 Oral     SpO2 11/19/19 0909 97 %     Weight 11/19/19 0910 (!) 275 lb (124.7 kg)     Height 11/19/19 0910 5\' 4"  (1.626 m)     Head Circumference --      Peak Flow --      Pain Score 11/19/19 0910 0     Pain Loc --      Pain Edu? --      Excl. in [place]? --    Constitutional: Alert and oriented. Well appearing and in no distress. Eyes: Normal exam ENT      Head: Normocephalic and atraumatic.      Mouth/Throat: Mucous membranes are moist. Cardiovascular: Normal rate, regular rhythm.  Respiratory: Normal respiratory effort without  tachypnea nor retractions. Breath sounds are clear Gastrointestinal: Soft and nontender.  Obese.  No distention.  Musculoskeletal: Nontender with normal range of motion in all extremities.  Neurologic:  Normal speech and language. No gross focal neurologic deficits  Skin:  Skin is warm, dry and intact.  Psychiatric: Mood and affect are normal.   ____________________________________________   RADIOLOGY  X-rays negative  ____________________________________________   INITIAL IMPRESSION / ASSESSMENT AND PLAN / ED COURSE  Pertinent labs & imaging results that were available during my care of the patient were reviewed by me and considered in my medical decision making (see chart for details).   Patient presents emergency department for generalized weakness found to be febrile to 102.3 this morning.  Overall patient appears well, largely negative review of systems.  Nontender abdomen.  Patient does have a chronic indwelling Foley catheter which could very likely be the source of the patient's fever.  We will check labs including lactic acid and blood cultures.  We will start on broad-spectrum antibiotics.  We will dose IV fluids and Tylenol awaiting lab results.  Patient agreeable to plan of care  Patient's work-up shows significant urinary tract infection which could be the cause of the patient's fever/sepsis.  Lactate has resulted greater than 2.1.  Covid test negative.  We will continue IV antibiotics and admit to the hospitalist service.  Urine culture is pending.  Blood cultures pending.  JARROD BODKINS was evaluated in Emergency Department on 11/19/2019 for the symptoms described in the history of present illness. She was evaluated in the context of the global COVID-19 pandemic, which necessitated consideration that the patient might be at risk for infection with the SARS-CoV-2 virus that causes COVID-19. Institutional protocols and algorithms that pertain to the evaluation of patients at risk  for COVID-19 are in a state of rapid change based on information released by regulatory bodies including the CDC and federal and state organizations. These policies and algorithms were followed during the patient's care in the ED.  CRITICAL CARE Performed by: Harvest Dark   Total critical care time: 30 minutes  Critical care time was exclusive of separately billable procedures and treating other patients.  Critical care was necessary to treat or prevent imminent or life-threatening deterioration.  Critical care was time spent personally by me on the following activities: development of treatment plan with patient and/or surrogate as well as nursing, discussions with consultants, evaluation of patient's response to treatment, examination of patient, obtaining history from patient or surrogate, ordering and performing treatments and interventions, ordering and review of laboratory studies, ordering and review of radiographic studies, pulse oximetry and re-evaluation of patient's condition.  ____________________________________________   FINAL CLINICAL IMPRESSION(S) / ED DIAGNOSES  Fever Weakness Sepsis   Harvest Dark, MD 11/19/19 [phone]

## 2019-11-19 NOTE — ED Notes (Signed)
Date and time results received: 11/19/19 1013 (use smartphrase ".now" to insert current time)  Test: lactic Critical Value: 2.1  Name of Provider Notified: Paduchowski  Orders Received?none at this time Or Actions Taken?:  acknowledged

## 2019-11-20 DIAGNOSIS — Z978 Presence of other specified devices: Secondary | ICD-10-CM | POA: Diagnosis not present

## 2019-11-20 DIAGNOSIS — F331 Major depressive disorder, recurrent, moderate: Secondary | ICD-10-CM | POA: Diagnosis not present

## 2019-11-20 DIAGNOSIS — N179 Acute kidney failure, unspecified: Secondary | ICD-10-CM | POA: Diagnosis not present

## 2019-11-20 DIAGNOSIS — A419 Sepsis, unspecified organism: Secondary | ICD-10-CM | POA: Diagnosis not present

## 2019-11-20 LAB — CBC
HCT: 32 % — ABNORMAL LOW (ref 36.0–46.0)
Hemoglobin: 10.6 g/dL — ABNORMAL LOW (ref 12.0–15.0)
MCH: 29 pg (ref 26.0–34.0)
MCHC: 33.1 g/dL (ref 30.0–36.0)
MCV: 87.4 fL (ref 80.0–100.0)
Platelets: 129 10*3/uL — ABNORMAL LOW (ref 150–400)
RBC: 3.66 MIL/uL — ABNORMAL LOW (ref 3.87–5.11)
RDW: 15.2 % (ref 11.5–15.5)
WBC: 7.7 10*3/uL (ref 4.0–10.5)
nRBC: 0 % (ref 0.0–0.2)

## 2019-11-20 LAB — BASIC METABOLIC PANEL
Anion gap: 10 (ref 5–15)
BUN: 18 mg/dL (ref 8–23)
CO2: 24 mmol/L (ref 22–32)
Calcium: 8.5 mg/dL — ABNORMAL LOW (ref 8.9–10.3)
Chloride: 103 mmol/L (ref 98–111)
Creatinine, Ser: 1.42 mg/dL — ABNORMAL HIGH (ref 0.44–1.00)
GFR calc Af Amer: 44 mL/min — ABNORMAL LOW (ref >60)
GFR calc non Af Amer: 38 mL/min — ABNORMAL LOW (ref >60)
Glucose, Bld: 104 mg/dL — ABNORMAL HIGH (ref 70–99)
Potassium: 4.2 mmol/L (ref 3.5–5.1)
Sodium: 137 mmol/L (ref 135–145)

## 2019-11-20 LAB — PROTIME-INR
INR: 1.1 (ref 0.8–1.2)
Prothrombin Time: 13.6 seconds (ref 11.4–15.2)

## 2019-11-20 LAB — PROCALCITONIN: Procalcitonin: 1.07 ng/mL

## 2019-11-20 LAB — CORTISOL-AM, BLOOD: Cortisol - AM: 18.7 ug/dL (ref 6.7–22.6)

## 2019-11-20 MED ORDER — CHLORHEXIDINE GLUCONATE CLOTH 2 % EX PADS
6.0000 | MEDICATED_PAD | Freq: Every day | CUTANEOUS | Status: DC
  Administered 2019-11-20 – 2019-11-24 (×5): 6 via TOPICAL

## 2019-11-20 MED ORDER — SODIUM CHLORIDE 0.9% FLUSH: 10.0000 mL | INTRAVENOUS | Status: DC | PRN

## 2019-11-20 NOTE — Progress Notes (Signed)
   11/20/19 1630  Assess: MEWS Score  Temp (!) 102.1 F (38.9 C) (RN Chris notified)  BP (!) 118/55  Pulse Rate 88  Resp 17  SpO2 100 %  O2 Device Room Air  Assess: MEWS Score  MEWS Temp 2  MEWS Systolic 0  MEWS Pulse 0  MEWS RR 0  MEWS LOC 0  MEWS Score 2  MEWS Score Color Yellow  Assess: if the MEWS score is Yellow or Red  Were vital signs taken at a resting state? Yes  Focused Assessment Change from prior assessment (see assessment flowsheet)  Early Detection of Sepsis Score *See Row Information* Low  Treat  Pain Scale 0-10  Pain Score 0  Escalate  MEWS: Escalate Yellow: discuss with charge nurse/RN and consider discussing with provider and RRT  Notify: Charge Nurse/RN  Name of Charge Nurse/RN Notified Jacelyn Pi   Date Charge Nurse/RN Notified 11/20/19  Time Charge Nurse/RN Notified 1635  Document  Patient Outcome Other (Comment) (gave prn tylenol )

## 2019-11-20 NOTE — Progress Notes (Signed)
PROGRESS NOTE    MESA JANUS  KNL:[id number] DOB: 04-20-1951 DOA: 11/19/2019 PCP: Kirk Ruths, MD    Assessment & Plan:   Principal Problem:   Sepsis [facility]) Active Problems:   Essential hypertension   Neurogenic bladder   Obesity, morbid ([place])   Depression   AKI (acute kidney injury) ([place])   Chronic indwelling Foley catheter    JYRAH BLYE is a 69 y.o. female with medical history significant for multiple sclerosis, neurogenic bladder with chronic indwelling Foley catheter, morbid obesity, hypertension, depression and COPD who presents to the ER for evaluation of generalized weakness which started this morning.  Patient was unable to get out of bed and called EMS.  When they arrived they found patient to be very weak and febrile with a T-max of 102.3 F.    Sepsis UTI from chronic Foley GNR bacteremia likely from urinary source --presented with T-max of 102.3, tachycardia, elevated lactic acid level, normal white cell count with a left shift and pyuria.  Started on cefepime then switched to meropenem PLAN: --continue meropenem pending blood and urine cx results  Neurogenic bladder with chronic indwelling Foley catheter, POA --Will exchange Foley after a few days of abx treatment --continue mirabegron  Acute kidney injury Patient's baseline serum creatinine is 0.89 and on admission it is 1.85. PLAN: --hold home Lisinopril --continue MIVF@75   Morbid obesity (BMI 79.0) Complicates overall prognosis and care  Depression Continue bupropion, clonazepam and Seroquel  History of multiple sclerosis Continue Teriflunomide, Cymbalta and gabapentin  Continue baclofen  COPD --continue bronchodilators   DVT prophylaxis: Lovenox SQ Code Status: DNR  Family Communication:  Status is: inpatient Dispo:   The patient is from: home Anticipated d/c is to: home Anticipated d/c date is: >3 days Patient currently is not medically stable to d/c due to: bacteremia on  IV abx, waiting on cx   Subjective and Interval History:  Pt reported no sensation in her bladder.  No flank pain.  Oral intake ok.  No fever.  Pt reported having cat scratches in her legs prior to presentation.   Objective: Vitals:   11/20/19 1103 11/20/19 1630 11/20/19 1739 11/20/19 1823  BP: (!) 127/50 (!) 118/55 128/73   Pulse: 71 88 83   Resp: 18 17 20    Temp: 99.1 F (37.3 C) (!) 102.1 F (38.9 C) (!) 101 F (38.3 C) 99.8 F (37.7 C)  TempSrc: Oral Oral  Oral  SpO2: 95% 100% 97%   Weight:      Height:        Intake/Output Summary (Last 24 hours) at 11/20/2019 1909 Last data filed at 11/20/2019 1800 Gross per 24 hour  Intake 2414.59 ml  Output 2500 ml  Net -85.41 ml   Filed Weights   11/19/19 0910 11/19/19 1645  Weight: (!) 124.7 kg (!) 126.5 kg    Examination:   Constitutional: NAD, AAOx3 HEENT: conjunctivae and lids normal, EOMI CV: faint heart sounds. Distal pulses +2.  No cyanosis.   RESP: CTA B/L, normal respiratory effort  GI: +BS, NTND Extremities: No effusions, edema, or tenderness in BLE SKIN: warm, dry.  Scratches from cats on both legs Neuro: II - XII grossly intact.   Psych: Normal mood and affect.    Data Reviewed: I have personally reviewed following labs and imaging studies  CBC: Recent Labs  Lab 11/19/19 0912 11/20/19 0419  WBC 10.8* 7.7  NEUTROABS 9.3*  --   HGB 11.9* 10.6*  HCT 38.0 32.0*  MCV 89.6  87.4  PLT 137* 119*   Basic Metabolic Panel: Recent Labs  Lab 11/19/19 0912 11/20/19 0419  NA 136 137  K 4.3 4.2  CL 101 103  CO2 23 24  GLUCOSE 177* 104*  BUN 18 18  CREATININE 1.52* 1.42*  CALCIUM 9.1 8.5*   GFR: Estimated Creatinine Clearance: 49.1 mL/min (A) (by C-G formula based on SCr of 1.42 mg/dL (H)). Liver Function Tests: Recent Labs  Lab 11/19/19 0912  AST 18  ALT 15  ALKPHOS 69  BILITOT 0.7  PROT 7.6  ALBUMIN 4.0   No results for input(s): LIPASE, AMYLASE in the last 168 hours. No results for  input(s): AMMONIA in the last 168 hours. Coagulation Profile: Recent Labs  Lab 11/19/19 0912 11/20/19 0419  INR 1.0 1.1   Cardiac Enzymes: No results for input(s): CKTOTAL, CKMB, CKMBINDEX, TROPONINI in the last 168 hours. BNP (last 3 results) No results for input(s): PROBNP in the last 8760 hours. HbA1C: No results for input(s): HGBA1C in the last 72 hours. CBG: No results for input(s): GLUCAP in the last 168 hours. Lipid Profile: No results for input(s): CHOL, HDL, LDLCALC, TRIG, CHOLHDL, LDLDIRECT in the last 72 hours. Thyroid Function Tests: No results for input(s): TSH, T4TOTAL, FREET4, T3FREE, THYROIDAB in the last 72 hours. Anemia Panel: No results for input(s): VITAMINB12, FOLATE, FERRITIN, TIBC, IRON, RETICCTPCT in the last 72 hours. Sepsis Labs: Recent Labs  Lab 11/19/19 0912 11/19/19 1127 11/20/19 0419  PROCALCITON  --   --  1.07  LATICACIDVEN 2.1* 1.2  --     Recent Results (from the past 240 hour(s))  Blood Culture (routine x 2)     Status: None (Preliminary result)   Collection Time: 11/19/19  9:12 AM   Specimen: BLOOD  Result Value Ref Range Status   Specimen Description BLOOD BLOOD LEFT FOREARM  Final   Special Requests   Final    BOTTLES DRAWN AEROBIC AND ANAEROBIC Blood Culture adequate volume   Culture   Final    NO GROWTH < 24 HOURS Performed at [facility], [address], [place], [place] [address]    Report Status PENDING  Incomplete  Blood Culture (routine x 2)     Status: None (Preliminary result)   Collection Time: 11/19/19  9:12 AM   Specimen: BLOOD  Result Value Ref Range Status   Specimen Description BLOOD LEFT ANTECUBITAL  Final   Special Requests   Final    BOTTLES DRAWN AEROBIC AND ANAEROBIC Blood Culture adequate volume   Culture  Setup Time   Final    Organism ID to follow GRAM NEGATIVE RODS AEROBIC BOTTLE ONLY Performed at [facility], [address] [place], [place] [address]    Culture GRAM NEGATIVE  RODS  Final   Report Status PENDING  Incomplete  Urine culture     Status: Abnormal (Preliminary result)   Collection Time: 11/19/19  9:12 AM   Specimen: In/Out Cath Urine  Result Value Ref Range Status   Specimen Description   Final    IN/OUT CATH URINE Performed at [facility], [address], [place], [place] [address]    Special Requests   Final    NONE Performed at [facility], [address] [place],  [address]    Culture >=100,000 COLONIES/mL ESCHERICHIA COLI (A)  Final   Report Status PENDING  Incomplete  Blood Culture ID Panel (Reflexed)     Status: Abnormal   Collection Time: 11/19/19  9:12 AM  Result Value Ref Range Status   Enterococcus species NOT DETECTED NOT DETECTED Final   Listeria monocytogenes NOT DETECTED NOT DETECTED Final   Staphylococcus species NOT DETECTED NOT DETECTED Final   Staphylococcus aureus (BCID) NOT DETECTED NOT DETECTED Final   Streptococcus species NOT DETECTED NOT DETECTED Final   Streptococcus agalactiae NOT DETECTED NOT DETECTED Final   Streptococcus pneumoniae NOT DETECTED NOT DETECTED Final   Streptococcus pyogenes NOT DETECTED NOT DETECTED Final   Acinetobacter baumannii NOT DETECTED NOT DETECTED Final   Enterobacteriaceae species DETECTED (A) NOT DETECTED Final    Comment: Enterobacteriaceae represent a large family of gram-negative bacteria, not a single organism. CRITICAL RESULT CALLED TO, READ BACK BY AND VERIFIED WITH: JASON ROBBINS 11/19/19 AT 2106 BY ACR    Enterobacter cloacae complex NOT DETECTED NOT DETECTED Final   Escherichia coli DETECTED (A) NOT DETECTED Final    Comment: CRITICAL RESULT CALLED TO, READ BACK BY AND VERIFIED WITH: JASON ROBBINS 11/19/19 AT 2106 BY ACR    Klebsiella oxytoca NOT DETECTED NOT DETECTED Final   Klebsiella pneumoniae NOT DETECTED NOT DETECTED Final   Proteus species NOT DETECTED NOT DETECTED Final   Serratia marcescens NOT DETECTED NOT DETECTED Final   Carbapenem  resistance NOT DETECTED NOT DETECTED Final   Haemophilus influenzae NOT DETECTED NOT DETECTED Final   Neisseria meningitidis NOT DETECTED NOT DETECTED Final   Pseudomonas aeruginosa NOT DETECTED NOT DETECTED Final   Candida albicans NOT DETECTED NOT DETECTED Final   Candida glabrata NOT DETECTED NOT DETECTED Final   Candida krusei NOT DETECTED NOT DETECTED Final   Candida parapsilosis NOT DETECTED NOT DETECTED Final   Candida tropicalis NOT DETECTED NOT DETECTED Final    Comment: Performed at [facility], [place], [place] [address]  SARS Coronavirus 2 by RT PCR (hospital order, performed in [place] hospital lab) Nasopharyngeal Nasopharyngeal Swab     Status: None   Collection Time: 11/19/19  9:48 AM   Specimen: Nasopharyngeal Swab  Result Value Ref Range Status   SARS Coronavirus 2 NEGATIVE NEGATIVE Final    Comment: (NOTE) SARS-CoV-2 target nucleic acids are NOT DETECTED.  The SARS-CoV-2 RNA is generally detectable in upper and lower respiratory specimens during the acute phase of infection. The lowest concentration of SARS-CoV-2 viral copies this assay can detect is 250 copies / mL. A negative result does not preclude SARS-CoV-2 infection and should not be used as the sole basis for treatment or other patient management decisions.  A negative result may occur with improper specimen collection / handling, submission of specimen other than nasopharyngeal swab, presence of viral mutation(s) within the areas targeted by this assay, and inadequate number of viral copies (<250 copies / mL). A negative result must be combined with clinical observations, patient history, and epidemiological information.  Fact Sheet for Patients:   StrictlyIdeas.no  Fact Sheet for Healthcare Providers: BankingDealers.co.za  This test is not yet approved or  cleared by the [place] FDA and has been authorized for detection  and/or diagnosis of SARS-CoV-2 by FDA under an Emergency Use Authorization (EUA).  This EUA will remain in effect (meaning this test can be used) for the duration of the COVID-19 declaration under Section 564(b)(1) of the Act, 21 U.S.C. section 360bbb-3(b)(1), unless the authorization is terminated or revoked sooner.  Performed at [facility], [address] [place], [place] [address]       Radiology Studies: [facility] Chest [place] 1 View  Result Date: 11/19/2019 CLINICAL DATA:  Fever. EXAM: PORTABLE CHEST 1 VIEW COMPARISON:  04/20/2019 FINDINGS: The cardiomediastinal silhouette is within normal limits for portable AP technique and slightly low lung volumes. The interstitial markings are chronically slightly prominent without overt edema, airspace consolidation, sizeable pleural effusion, or pneumothorax identified. No acute osseous abnormality is seen. IMPRESSION: No active disease. Electronically Signed   By: Logan Bores M.D.   On: 11/19/2019 09:55     Scheduled Meds: . vitamin C  500 mg Oral QPM  . atorvastatin  10 mg Oral q1800  . baclofen  10 mg Oral TID  . buPROPion  300 mg Oral Daily  . Chlorhexidine Gluconate Cloth  6 each Topical Daily  . docusate sodium  100 mg Oral BID  . DULoxetine  60 mg Oral Daily  . enoxaparin (LOVENOX) injection  40 mg Subcutaneous Q24H  . famotidine  20 mg Oral Daily  . gabapentin  600 mg Oral TID  . hydrocerin  1 application Topical BID  . magnesium oxide  400 mg Oral Daily  . mirabegron ER  50 mg Oral Daily  . mometasone-formoterol  2 puff Inhalation BID  . multivitamin with minerals  1 tablet Oral QPM  . QUEtiapine Fumarate  150 mg Oral QHS  . Teriflunomide  14 mg Oral Daily   Continuous Infusions: . sodium chloride 75 mL/hr at 11/20/19 1800  . meropenem (MERREM) IV Stopped (11/20/19 1048)     LOS: 1 day     Enzo Bi, MD [facility] If 7PM-7AM, please contact night-coverage 11/20/2019, 7:09 PM

## 2019-11-20 NOTE — Progress Notes (Signed)
   11/20/19 0808  Assess: MEWS Score  Temp (!) 102 F (38.9 C) (RN Chris notified)  BP (!) 137/64  Pulse Rate 88  Resp 14  Level of Consciousness Alert  SpO2 94 %  O2 Device Room Air  Assess: MEWS Score  MEWS Temp 2  MEWS Systolic 0  MEWS Pulse 0  MEWS RR 0  MEWS LOC 0  MEWS Score 2  MEWS Score Color Yellow  Assess: if the MEWS score is Yellow or Red  Were vital signs taken at a resting state? Yes  Focused Assessment No change from prior assessment  Early Detection of Sepsis Score *See Row Information* Low  MEWS guidelines implemented *See Row Information* Yes  Treat  MEWS Interventions Administered scheduled meds/treatments  Pain Scale 0-10  Pain Score 0  Take Vital Signs  Increase Vital Sign Frequency  Yellow: Q 2hr X 2 then Q 4hr X 2, if remains yellow, continue Q 4hrs  Escalate  MEWS: Escalate Yellow: discuss with charge nurse/RN and consider discussing with provider and RRT  Notify: Charge Nurse/RN  Name of Charge Nurse/RN Notified Jacelyn Pi   Date Charge Nurse/RN Notified 11/20/19  Time Charge Nurse/RN Notified 0808  Document  Patient Outcome Other (Comment) (tylenol given  )  Progress note created (see row info) Yes

## 2019-11-21 DIAGNOSIS — R6520 Severe sepsis without septic shock: Secondary | ICD-10-CM | POA: Diagnosis not present

## 2019-11-21 DIAGNOSIS — A419 Sepsis, unspecified organism: Secondary | ICD-10-CM | POA: Diagnosis not present

## 2019-11-21 DIAGNOSIS — N179 Acute kidney failure, unspecified: Secondary | ICD-10-CM | POA: Diagnosis not present

## 2019-11-21 DIAGNOSIS — Z978 Presence of other specified devices: Secondary | ICD-10-CM | POA: Diagnosis not present

## 2019-11-21 LAB — CBC
HCT: 31.9 % — ABNORMAL LOW (ref 36.0–46.0)
Hemoglobin: 10.6 g/dL — ABNORMAL LOW (ref 12.0–15.0)
MCH: 28.7 pg (ref 26.0–34.0)
MCHC: 33.2 g/dL (ref 30.0–36.0)
MCV: 86.4 fL (ref 80.0–100.0)
Platelets: 129 10*3/uL — ABNORMAL LOW (ref 150–400)
RBC: 3.69 MIL/uL — ABNORMAL LOW (ref 3.87–5.11)
RDW: 15 % (ref 11.5–15.5)
WBC: 4.5 10*3/uL (ref 4.0–10.5)
nRBC: 0 % (ref 0.0–0.2)

## 2019-11-21 LAB — BASIC METABOLIC PANEL
Anion gap: 8 (ref 5–15)
BUN: 12 mg/dL (ref 8–23)
CO2: 24 mmol/L (ref 22–32)
Calcium: 8.6 mg/dL — ABNORMAL LOW (ref 8.9–10.3)
Chloride: 104 mmol/L (ref 98–111)
Creatinine, Ser: 1.15 mg/dL — ABNORMAL HIGH (ref 0.44–1.00)
GFR calc Af Amer: 57 mL/min — ABNORMAL LOW (ref >60)
GFR calc non Af Amer: 49 mL/min — ABNORMAL LOW (ref >60)
Glucose, Bld: 98 mg/dL (ref 70–99)
Potassium: 3.7 mmol/L (ref 3.5–5.1)
Sodium: 136 mmol/L (ref 135–145)

## 2019-11-21 LAB — MAGNESIUM: Magnesium: 1.7 mg/dL (ref 1.7–2.4)

## 2019-11-21 LAB — URINE CULTURE: Culture: >=100000 — AB

## 2019-11-21 LAB — C DIFFICILE QUICK SCREEN W PCR REFLEX
C Diff antigen: NEGATIVE
C Diff interpretation: NOT DETECTED
C Diff toxin: NEGATIVE

## 2019-11-21 MED ORDER — SODIUM CHLORIDE 0.9 % IV SOLN
1.0000 g | Freq: Three times a day (TID) | INTRAVENOUS | Status: DC
  Filled 2019-11-21 (×3): qty 1

## 2019-11-21 MED ORDER — ENOXAPARIN SODIUM 40 MG/0.4ML ~~LOC~~ SOLN
40.0000 mg | Freq: Two times a day (BID) | SUBCUTANEOUS | Status: DC
  Administered 2019-11-21 – 2019-11-24 (×6): 40 mg via SUBCUTANEOUS
  Filled 2019-11-21 (×6): qty 0.4

## 2019-11-21 MED ORDER — SODIUM CHLORIDE 0.9 % IV SOLN
2.0000 g | INTRAVENOUS | Status: DC
  Administered 2019-11-21: 2 g via INTRAVENOUS
  Filled 2019-11-21: qty 2
  Filled 2019-11-21: qty 20

## 2019-11-21 NOTE — Progress Notes (Signed)
Anticoagulation monitoring(Lovenox):  69 yo female ordered Lovenox 40 mg Q24h  Filed Weights   11/19/19 0910 11/19/19 1645  Weight: (!) 124.7 kg (275 lb) (!) 126.5 kg (278 lb 14.4 oz)   Body mass index is 49.4 kg/m.   Lab Results  Component Value Date   CREATININE 1.15 (H) 11/21/2019   CREATININE 1.42 (H) 11/20/2019   CREATININE 1.52 (H) 11/19/2019   Estimated Creatinine Clearance: 60.6 mL/min (A) (by C-G formula based on SCr of 1.15 mg/dL (H)). Hemoglobin & Hematocrit     Component Value Date/Time   HGB 10.6 (L) 11/21/2019 0545   HGB 13.6 08/01/2013 2312   HCT 31.9 (L) 11/21/2019 0545   HCT 40.2 08/01/2013 2312     Per Protocol for Patient with estCrcl >30 ml/min and BMI > 40, will transition to Lovenox 40 mg Q12h.

## 2019-11-21 NOTE — Progress Notes (Signed)
PHARMACY NOTE:  ANTIMICROBIAL RENAL DOSAGE ADJUSTMENT  Current antimicrobial regimen includes a mismatch between antimicrobial dosage and estimated renal function.  As per policy approved by the Pharmacy & Therapeutics and Medical Executive Committees, the antimicrobial dosage will be adjusted accordingly.  Current antimicrobial dosage:  Meropenem 1g IV q12h   Indication: bacteremia  Renal Function:  Estimated Creatinine Clearance: 60.6 mL/min (A) (by C-G formula based on SCr of 1.15 mg/dL (H)).  Antimicrobial dosage has been changed to:  Meropenem 1 g IV q8h   Additional comments: Renal function improving   Thank you for allowing pharmacy to be a part of this patient's care.  Rocky Morel, [facility] 11/21/2019 2:28 PM

## 2019-11-21 NOTE — Progress Notes (Addendum)
PROGRESS NOTE    [name]  ZOX:[id number] DOB: 1950/05/20 DOA: 11/19/2019 PCP: Kirk Ruths, MD    Assessment & Plan:   Principal Problem:   Sepsis [facility]) Active Problems:   Essential hypertension   Neurogenic bladder   Obesity, morbid ([place])   Depression   AKI (acute kidney injury) ([place])   Chronic indwelling Foley catheter    [name] is a 69 y.o. Caucasian female with medical history significant for multiple sclerosis, neurogenic bladder with chronic indwelling Foley catheter, morbid obesity, hypertension, depression and COPD who presents to the ER for evaluation of generalized weakness which started this morning.  Patient was unable to get out of bed and called EMS.  When they arrived they found patient to be very weak and febrile with a T-max of 102.3 F.    Sepsis UTI from chronic Foley, 2/2 E coli E coli bacteremia likely from urinary source --presented with T-max of 102.3, tachycardia, elevated lactic acid level, normal white cell count with a left shift and pyuria.  Started on cefepime then switched to meropenem --Urine cx grew pan-sensitive E coli.  Blood cx also pos for E coli, likely the same. PLAN: --de-escalate to ceftriaxone today based on cx sensitivities.   --repeat blood cx x2 today  Neurogenic bladder with chronic indwelling Foley catheter, POA --will exchange Foley tomorrow after a few days of abx  --continue mirabegron  Acute kidney injury Patient's baseline serum creatinine is 0.89 and on admission it is 1.85.  AKI due to sepsis and UTI. PLAN: --continue to hold home Lisinopril --d/c MIVF today since pt is taking in oral hydration  Morbid obesity (BMI 81.1) Complicates overall prognosis and care  Depression Continue bupropion, clonazepam and Seroquel  History of multiple sclerosis Continue Teriflunomide, Cymbalta and gabapentin  Continue baclofen  COPD --continue daily bronchodilators as Dulera   DVT prophylaxis: Lovenox  SQ Code Status: DNR  Family Communication:  Status is: inpatient Dispo:   The patient is from: home Anticipated d/c is to: home Anticipated d/c date is: >3 days Patient currently is not medically stable to d/c due to: bacteremia on IV abx, need to have repeat blood cx neg growth for 3 days   Subjective and Interval History:  Pt reported no pain.  No complaints except not liking the food, asked for regular diet instead of heart healthy.   Objective: Vitals:   11/21/19 0029 11/21/19 0533 11/21/19 0803 11/21/19 1125  BP: 116/72 (!) 148/70 (!) 145/67 (!) 112/44  Pulse: 73 71 69 66  Resp: 20 16 18 17   Temp: 99.4 F (37.4 C) 99.1 F (37.3 C) 99.2 F (37.3 C) 98.3 F (36.8 C)  TempSrc: Oral Oral Oral Oral  SpO2: 96% 95% 97% 97%  Weight:      Height:        Intake/Output Summary (Last 24 hours) at 11/21/2019 1324 Last data filed at 11/21/2019 1202 Gross per 24 hour  Intake 1218.01 ml  Output 3750 ml  Net -2531.99 ml   Filed Weights   11/19/19 0910 11/19/19 1645  Weight: (!) 124.7 kg (!) 126.5 kg    Examination:   Constitutional: NAD, AAOx3 HEENT: conjunctivae and lids normal, EOMI CV: RRR faint heart sounds. Distal pulses +2.  No cyanosis.   RESP: CTA B/L, normal respiratory effort  GI: +BS, NTND Extremities: No effusions, edema, or tenderness in BLE SKIN: warm, dry and intact Neuro: II - XII grossly intact.  Sensation intact Psych: Normal mood and affect.  Foley returning clear urine   Data Reviewed: I have personally reviewed following labs and imaging studies  CBC: Recent Labs  Lab 11/19/19 0912 11/20/19 0419 11/21/19 0545  WBC 10.8* 7.7 4.5  NEUTROABS 9.3*  --   --   HGB 11.9* 10.6* 10.6*  HCT 38.0 32.0* 31.9*  MCV 89.6 87.4 86.4  PLT 137* 129* 742*   Basic Metabolic Panel: Recent Labs  Lab 11/19/19 0912 11/20/19 0419 11/21/19 0545  NA 136 137 136  K 4.3 4.2 3.7  CL 101 103 104  CO2 23 24 24   GLUCOSE 177* 104* 98  BUN 18 18 12    CREATININE 1.52* 1.42* 1.15*  CALCIUM 9.1 8.5* 8.6*  MG  --   --  1.7   GFR: Estimated Creatinine Clearance: 60.6 mL/min (A) (by C-G formula based on SCr of 1.15 mg/dL (H)). Liver Function Tests: Recent Labs  Lab 11/19/19 0912  AST 18  ALT 15  ALKPHOS 69  BILITOT 0.7  PROT 7.6  ALBUMIN 4.0   No results for input(s): LIPASE, AMYLASE in the last 168 hours. No results for input(s): AMMONIA in the last 168 hours. Coagulation Profile: Recent Labs  Lab 11/19/19 0912 11/20/19 0419  INR 1.0 1.1   Cardiac Enzymes: No results for input(s): CKTOTAL, CKMB, CKMBINDEX, TROPONINI in the last 168 hours. BNP (last 3 results) No results for input(s): PROBNP in the last 8760 hours. HbA1C: No results for input(s): HGBA1C in the last 72 hours. CBG: No results for input(s): GLUCAP in the last 168 hours. Lipid Profile: No results for input(s): CHOL, HDL, LDLCALC, TRIG, CHOLHDL, LDLDIRECT in the last 72 hours. Thyroid Function Tests: No results for input(s): TSH, T4TOTAL, FREET4, T3FREE, THYROIDAB in the last 72 hours. Anemia Panel: No results for input(s): VITAMINB12, FOLATE, FERRITIN, TIBC, IRON, RETICCTPCT in the last 72 hours. Sepsis Labs: Recent Labs  Lab 11/19/19 0912 11/19/19 1127 11/20/19 0419  PROCALCITON  --   --  1.07  LATICACIDVEN 2.1* 1.2  --     Recent Results (from the past 240 hour(s))  Blood Culture (routine x 2)     Status: None (Preliminary result)   Collection Time: 11/19/19  9:12 AM   Specimen: BLOOD  Result Value Ref Range Status   Specimen Description BLOOD BLOOD LEFT FOREARM  Final   Special Requests   Final    BOTTLES DRAWN AEROBIC AND ANAEROBIC Blood Culture adequate volume   Culture   Final    NO GROWTH 2 DAYS Performed at [facility], [address], [place], [place] [address]    Report Status PENDING  Incomplete  Blood Culture (routine x 2)     Status: Abnormal (Preliminary result)   Collection Time: 11/19/19  9:12 AM   Specimen:  BLOOD  Result Value Ref Range Status   Specimen Description   Final    BLOOD LEFT ANTECUBITAL Performed at [facility], [address] [place], [place] [address]    Special Requests   Final    BOTTLES DRAWN AEROBIC AND ANAEROBIC Blood Culture adequate volume Performed at [facility], [address] [place], [place] [address]    Culture  Setup Time GRAM NEGATIVE RODS AEROBIC BOTTLE ONLY   Final   Culture (A)  Final    ESCHERICHIA COLI SUSCEPTIBILITIES TO FOLLOW Performed at [facility], [place] [address], [place], [place] [address]    Report Status PENDING  Incomplete  Urine culture     Status: Abnormal   Collection  Time: 11/19/19  9:12 AM   Specimen: In/Out Cath Urine  Result Value Ref Range Status   Specimen Description   Final    IN/OUT CATH URINE Performed at [facility], [address] [place], [place] [address]    Special Requests   Final    NONE Performed at [facility], [place], [place] [address]    Culture >=100,000 COLONIES/mL ESCHERICHIA COLI (A)  Final   Report Status 11/21/2019 FINAL  Final   Organism ID, Bacteria ESCHERICHIA COLI (A)  Final      Susceptibility   Escherichia coli - MIC*    AMPICILLIN <=2 SENSITIVE Sensitive     CEFAZOLIN <=4 SENSITIVE Sensitive     CEFTRIAXONE <=0.25 SENSITIVE Sensitive     CIPROFLOXACIN <=0.25 SENSITIVE Sensitive     GENTAMICIN <=1 SENSITIVE Sensitive     IMIPENEM <=0.25 SENSITIVE Sensitive     NITROFURANTOIN <=16 SENSITIVE Sensitive     TRIMETH/SULFA <=20 SENSITIVE Sensitive     AMPICILLIN/SULBACTAM <=2 SENSITIVE Sensitive     PIP/TAZO <=4 SENSITIVE Sensitive     * >=100,000 COLONIES/mL ESCHERICHIA COLI  Blood Culture ID Panel (Reflexed)     Status: Abnormal   Collection Time: 11/19/19  9:12 AM  Result Value Ref Range Status   Enterococcus species NOT DETECTED NOT DETECTED Final   Listeria monocytogenes NOT DETECTED NOT DETECTED Final   Staphylococcus  species NOT DETECTED NOT DETECTED Final   Staphylococcus aureus (BCID) NOT DETECTED NOT DETECTED Final   Streptococcus species NOT DETECTED NOT DETECTED Final   Streptococcus agalactiae NOT DETECTED NOT DETECTED Final   Streptococcus pneumoniae NOT DETECTED NOT DETECTED Final   Streptococcus pyogenes NOT DETECTED NOT DETECTED Final   Acinetobacter baumannii NOT DETECTED NOT DETECTED Final   Enterobacteriaceae species DETECTED (A) NOT DETECTED Final    Comment: Enterobacteriaceae represent a large family of gram-negative bacteria, not a single organism. CRITICAL RESULT CALLED TO, READ BACK BY AND VERIFIED WITH: JASON ROBBINS 11/19/19 AT 2106 BY ACR    Enterobacter cloacae complex NOT DETECTED NOT DETECTED Final   Escherichia coli DETECTED (A) NOT DETECTED Final    Comment: CRITICAL RESULT CALLED TO, READ BACK BY AND VERIFIED WITH: JASON ROBBINS 11/19/19 AT 2106 BY ACR    Klebsiella oxytoca NOT DETECTED NOT DETECTED Final   Klebsiella pneumoniae NOT DETECTED NOT DETECTED Final   Proteus species NOT DETECTED NOT DETECTED Final   Serratia marcescens NOT DETECTED NOT DETECTED Final   Carbapenem resistance NOT DETECTED NOT DETECTED Final   Haemophilus influenzae NOT DETECTED NOT DETECTED Final   Neisseria meningitidis NOT DETECTED NOT DETECTED Final   Pseudomonas aeruginosa NOT DETECTED NOT DETECTED Final   Candida albicans NOT DETECTED NOT DETECTED Final   Candida glabrata NOT DETECTED NOT DETECTED Final   Candida krusei NOT DETECTED NOT DETECTED Final   Candida parapsilosis NOT DETECTED NOT DETECTED Final   Candida tropicalis NOT DETECTED NOT DETECTED Final    Comment: Performed at [facility]: [address], [place], [place] [address]  SARS Coronavirus 2 by RT PCR (hospital order, performed in [place] hospital lab) Nasopharyngeal Nasopharyngeal Swab     Status: None   Collection Time: 11/19/19  9:48 AM   Specimen: Nasopharyngeal Swab  Result Value Ref Range Status   SARS  Coronavirus 2 NEGATIVE NEGATIVE Final    Comment: (NOTE) SARS-CoV-2 target nucleic acids are NOT DETECTED.  The SARS-CoV-2 RNA is generally detectable in upper and lower respiratory specimens during the  acute phase of infection. The lowest concentration of SARS-CoV-2 viral copies this assay can detect is 250 copies / mL. A negative result does not preclude SARS-CoV-2 infection and should not be used as the sole basis for treatment or other patient management decisions.  A negative result may occur with improper specimen collection / handling, submission of specimen other than nasopharyngeal swab, presence of viral mutation(s) within the areas targeted by this assay, and inadequate number of viral copies (<250 copies / mL). A negative result must be combined with clinical observations, patient history, and epidemiological information.  Fact Sheet for Patients:   StrictlyIdeas.no  Fact Sheet for Healthcare Providers: BankingDealers.co.za  This test is not yet approved or  cleared by the [place] FDA and has been authorized for detection and/or diagnosis of SARS-CoV-2 by FDA under an Emergency Use Authorization (EUA).  This EUA will remain in effect (meaning this test can be used) for the duration of the COVID-19 declaration under Section 564(b)(1) of the Act, 21 U.S.C. section 360bbb-3(b)(1), unless the authorization is terminated or revoked sooner.  Performed at [facility], [place], [place] [address]   C Difficile Quick Screen w PCR reflex     Status: None   Collection Time: 11/21/19  5:25 AM   Specimen: STOOL  Result Value Ref Range Status   C Diff antigen NEGATIVE NEGATIVE Final   C Diff toxin NEGATIVE NEGATIVE Final   C Diff interpretation No C. difficile detected.  Final    Comment: Performed at [facility], [address] [place], [place] [address]      Radiology Studies: No  results found.   Scheduled Meds: . vitamin C  500 mg Oral QPM  . atorvastatin  10 mg Oral q1800  . baclofen  10 mg Oral TID  . buPROPion  300 mg Oral Daily  . Chlorhexidine Gluconate Cloth  6 each Topical Daily  . docusate sodium  100 mg Oral BID  . DULoxetine  60 mg Oral Daily  . enoxaparin (LOVENOX) injection  40 mg Subcutaneous Q24H  . famotidine  20 mg Oral Daily  . gabapentin  600 mg Oral TID  . hydrocerin  1 application Topical BID  . magnesium oxide  400 mg Oral Daily  . mirabegron ER  50 mg Oral Daily  . mometasone-formoterol  2 puff Inhalation BID  . multivitamin with minerals  1 tablet Oral QPM  . QUEtiapine Fumarate  150 mg Oral QHS  . Teriflunomide  14 mg Oral Daily   Continuous Infusions: . meropenem (MERREM) IV 20 mL/hr at 11/21/19 1202     LOS: 2 days     Enzo Bi, MD [facility] If 7PM-7AM, please contact night-coverage 11/21/2019, 1:24 PM

## 2019-11-22 ENCOUNTER — Encounter: Payer: Self-pay | Admitting: Internal Medicine

## 2019-11-22 DIAGNOSIS — Z978 Presence of other specified devices: Secondary | ICD-10-CM | POA: Diagnosis not present

## 2019-11-22 DIAGNOSIS — A419 Sepsis, unspecified organism: Secondary | ICD-10-CM | POA: Diagnosis not present

## 2019-11-22 DIAGNOSIS — N179 Acute kidney failure, unspecified: Secondary | ICD-10-CM | POA: Diagnosis not present

## 2019-11-22 DIAGNOSIS — R6520 Severe sepsis without septic shock: Secondary | ICD-10-CM | POA: Diagnosis not present

## 2019-11-22 LAB — BASIC METABOLIC PANEL
Anion gap: 8 (ref 5–15)
BUN: 11 mg/dL (ref 8–23)
CO2: 27 mmol/L (ref 22–32)
Calcium: 9 mg/dL (ref 8.9–10.3)
Chloride: 107 mmol/L (ref 98–111)
Creatinine, Ser: 1.03 mg/dL — ABNORMAL HIGH (ref 0.44–1.00)
GFR calc Af Amer: >60 mL/min (ref >60)
GFR calc non Af Amer: 56 mL/min — ABNORMAL LOW (ref >60)
Glucose, Bld: 102 mg/dL — ABNORMAL HIGH (ref 70–99)
Potassium: 3.6 mmol/L (ref 3.5–5.1)
Sodium: 142 mmol/L (ref 135–145)

## 2019-11-22 LAB — CBC
HCT: 33.7 % — ABNORMAL LOW (ref 36.0–46.0)
Hemoglobin: 11.3 g/dL — ABNORMAL LOW (ref 12.0–15.0)
MCH: 28.8 pg (ref 26.0–34.0)
MCHC: 33.5 g/dL (ref 30.0–36.0)
MCV: 85.8 fL (ref 80.0–100.0)
Platelets: 141 10*3/uL — ABNORMAL LOW (ref 150–400)
RBC: 3.93 MIL/uL (ref 3.87–5.11)
RDW: 14.7 % (ref 11.5–15.5)
WBC: 4 10*3/uL (ref 4.0–10.5)
nRBC: 0 % (ref 0.0–0.2)

## 2019-11-22 LAB — CULTURE, BLOOD (ROUTINE X 2): Special Requests: ADEQUATE

## 2019-11-22 LAB — MAGNESIUM: Magnesium: 1.7 mg/dL (ref 1.7–2.4)

## 2019-11-22 MED ORDER — SODIUM CHLORIDE 0.9 % IV SOLN
2.0000 g | Freq: Four times a day (QID) | INTRAVENOUS | Status: DC
  Administered 2019-11-22 – 2019-11-24 (×8): 2 g via INTRAVENOUS
  Filled 2019-11-22 (×4): qty 2
  Filled 2019-11-22 (×4): qty 2000
  Filled 2019-11-22: qty 2
  Filled 2019-11-22: qty 2000
  Filled 2019-11-22 (×2): qty 2
  Filled 2019-11-22: qty 2000

## 2019-11-22 NOTE — Progress Notes (Signed)
PROGRESS NOTE    [name]  ZOX:[id number] DOB: 1951-02-13 DOA: 11/19/2019 PCP: Kirk Ruths, MD    Assessment & Plan:   Principal Problem:   Sepsis [facility]) Active Problems:   Essential hypertension   Neurogenic bladder   Obesity, morbid ([place])   Depression   AKI (acute kidney injury) ([place])   Chronic indwelling Foley catheter    [name] is a 69 y.o. Caucasian female with medical history significant for multiple sclerosis, neurogenic bladder with chronic indwelling Foley catheter, morbid obesity, hypertension, depression and COPD who presents to the ER for evaluation of generalized weakness which started this morning.  Patient was unable to get out of bed and called EMS.  When they arrived they found patient to be very weak and febrile with a T-max of 102.3 F.    Sepsis UTI from chronic Foley, 2/2 E coli E coli bacteremia likely from urinary source --presented with T-max of 102.3, tachycardia, elevated lactic acid level, normal white cell count with a left shift and pyuria.  Started on cefepime then switched to meropenem then de-escalated to ceftriaxone. --Urine cx grew pan-sensitive E coli.  Blood cx also pos for E coli, likely the same. --repeat blood cx x2 lbtained 7/25 PLAN: --per pharm, further de-escalate to IV ampicillin per cx sensitivies  Neurogenic bladder with chronic indwelling Foley catheter, POA --remove current foley today --bladder scan, if retaining, re-insert Foley --continue mirabegron  Acute kidney injury Patient's baseline serum creatinine is 0.89 and on admission it is 1.85.  AKI due to sepsis and UTI. --Cr improved with IVF hydration, since d/c'ed. PLAN: --continue to hold home Lisinopril --Hold further MIVF since pt is taking in oral hydration  Morbid obesity (BMI 81.1) Complicates overall prognosis and care  Depression Continue bupropion, clonazepam and Seroquel  History of multiple sclerosis Continue Teriflunomide, Cymbalta  and gabapentin  Continue baclofen  COPD --continue daily bronchodilators as Dulera  GERD --continue Pepcid   DVT prophylaxis: Lovenox SQ Code Status: DNR  Family Communication:  Status is: inpatient Dispo:   The patient is from: home Anticipated d/c is to: home Anticipated d/c date is: 2-3 days Patient currently is not medically stable to d/c due to: bacteremia on IV abx, need to have repeat blood cx neg growth for 3 days   Subjective and Interval History:  No complaints.  No dysuria or abdominal pain.   Objective: Vitals:   11/22/19 0005 11/22/19 0509 11/22/19 0809 11/22/19 1530  BP: (!) 141/56 (!) 155/47 (!) 151/72 (!) 141/77  Pulse: 69 65 70 70  Resp: 16 16 16 16   Temp: 98.4 F (36.9 C) 98.3 F (36.8 C) 98.1 F (36.7 C) 98.3 F (36.8 C)  TempSrc: Oral Oral Oral   SpO2: 95% 99% 96% 96%  Weight:      Height:        Intake/Output Summary (Last 24 hours) at 11/22/2019 1901 Last data filed at 11/22/2019 1831 Gross per 24 hour  Intake 100 ml  Output 2000 ml  Net -1900 ml   Filed Weights   11/19/19 0910 11/19/19 1645  Weight: (!) 124.7 kg (!) 126.5 kg    Examination:   Constitutional: NAD, AAOx3 HEENT: conjunctivae and lids normal, EOMI CV: RRR faint heart sounds. Distal pulses +2.  No cyanosis.   RESP: CTA B/L, normal respiratory effort  GI: +BS, NTND Extremities: No effusions, edema, or tenderness in BLE SKIN: warm, dry and intact Neuro: II - XII grossly intact.  Sensation intact Psych: Normal  mood and affect.    Foley returning clear urine.    Data Reviewed: I have personally reviewed following labs and imaging studies  CBC: Recent Labs  Lab 11/19/19 0912 11/20/19 0419 11/21/19 0545 11/22/19 0447  WBC 10.8* 7.7 4.5 4.0  NEUTROABS 9.3*  --   --   --   HGB 11.9* 10.6* 10.6* 11.3*  HCT 38.0 32.0* 31.9* 33.7*  MCV 89.6 87.4 86.4 85.8  PLT 137* 129* 129* 034*   Basic Metabolic Panel: Recent Labs  Lab 11/19/19 0912 11/20/19 0419  11/21/19 0545 11/22/19 0447  NA 136 137 136 142  K 4.3 4.2 3.7 3.6  CL 101 103 104 107  CO2 23 24 24 27   GLUCOSE 177* 104* 98 102*  BUN 18 18 12 11   CREATININE 1.52* 1.42* 1.15* 1.03*  CALCIUM 9.1 8.5* 8.6* 9.0  MG  --   --  1.7 1.7   GFR: Estimated Creatinine Clearance: 67.7 mL/min (A) (by C-G formula based on SCr of 1.03 mg/dL (H)). Liver Function Tests: Recent Labs  Lab 11/19/19 0912  AST 18  ALT 15  ALKPHOS 69  BILITOT 0.7  PROT 7.6  ALBUMIN 4.0   No results for input(s): LIPASE, AMYLASE in the last 168 hours. No results for input(s): AMMONIA in the last 168 hours. Coagulation Profile: Recent Labs  Lab 11/19/19 0912 11/20/19 0419  INR 1.0 1.1   Cardiac Enzymes: No results for input(s): CKTOTAL, CKMB, CKMBINDEX, TROPONINI in the last 168 hours. BNP (last 3 results) No results for input(s): PROBNP in the last 8760 hours. HbA1C: No results for input(s): HGBA1C in the last 72 hours. CBG: No results for input(s): GLUCAP in the last 168 hours. Lipid Profile: No results for input(s): CHOL, HDL, LDLCALC, TRIG, CHOLHDL, LDLDIRECT in the last 72 hours. Thyroid Function Tests: No results for input(s): TSH, T4TOTAL, FREET4, T3FREE, THYROIDAB in the last 72 hours. Anemia Panel: No results for input(s): VITAMINB12, FOLATE, FERRITIN, TIBC, IRON, RETICCTPCT in the last 72 hours. Sepsis Labs: Recent Labs  Lab 11/19/19 0912 11/19/19 1127 11/20/19 0419  PROCALCITON  --   --  1.07  LATICACIDVEN 2.1* 1.2  --     Recent Results (from the past 240 hour(s))  Blood Culture (routine x 2)     Status: None (Preliminary result)   Collection Time: 11/19/19  9:12 AM   Specimen: BLOOD  Result Value Ref Range Status   Specimen Description BLOOD BLOOD LEFT FOREARM  Final   Special Requests   Final    BOTTLES DRAWN AEROBIC AND ANAEROBIC Blood Culture adequate volume   Culture   Final    NO GROWTH 3 DAYS Performed at [facility], [address] [place], [place]  [address]    Report Status PENDING  Incomplete  Blood Culture (routine x 2)     Status: Abnormal   Collection Time: 11/19/19  9:12 AM   Specimen: BLOOD  Result Value Ref Range Status   Specimen Description   Final    BLOOD LEFT ANTECUBITAL Performed at [facility], [address] [place], [place] [address]    Special Requests   Final    BOTTLES DRAWN AEROBIC AND ANAEROBIC Blood Culture adequate volume Performed at [facility], [address], [place], [place] [address]    Culture  Setup Time   Final    GRAM NEGATIVE RODS AEROBIC BOTTLE ONLY Performed at [facility], [place] [address], [place], [place] [address]    Culture ESCHERICHIA  COLI (A)  Final   Report Status 11/22/2019 FINAL  Final   Organism ID, Bacteria ESCHERICHIA COLI  Final      Susceptibility   Escherichia coli - MIC*    AMPICILLIN <=2 SENSITIVE Sensitive     CEFAZOLIN <=4 SENSITIVE Sensitive     CEFEPIME <=0.12 SENSITIVE Sensitive     CEFTAZIDIME <=1 SENSITIVE Sensitive     CEFTRIAXONE <=0.25 SENSITIVE Sensitive     CIPROFLOXACIN <=0.25 SENSITIVE Sensitive     GENTAMICIN <=1 SENSITIVE Sensitive     IMIPENEM <=0.25 SENSITIVE Sensitive     TRIMETH/SULFA <=20 SENSITIVE Sensitive     AMPICILLIN/SULBACTAM <=2 SENSITIVE Sensitive     PIP/TAZO <=4 SENSITIVE Sensitive     * ESCHERICHIA COLI  Urine culture     Status: Abnormal   Collection Time: 11/19/19  9:12 AM   Specimen: In/Out Cath Urine  Result Value Ref Range Status   Specimen Description   Final    IN/OUT CATH URINE Performed at [facility], [place] [place], [place] [address]    Special Requests   Final    NONE Performed at [facility], [address] [place], [place] [address]    Culture >=100,000 COLONIES/mL ESCHERICHIA COLI (A)  Final   Report Status 11/21/2019 FINAL  Final   Organism ID, Bacteria ESCHERICHIA COLI (A)  Final      Susceptibility   Escherichia coli - MIC*    AMPICILLIN <=2  SENSITIVE Sensitive     CEFAZOLIN <=4 SENSITIVE Sensitive     CEFTRIAXONE <=0.25 SENSITIVE Sensitive     CIPROFLOXACIN <=0.25 SENSITIVE Sensitive     GENTAMICIN <=1 SENSITIVE Sensitive     IMIPENEM <=0.25 SENSITIVE Sensitive     NITROFURANTOIN <=16 SENSITIVE Sensitive     TRIMETH/SULFA <=20 SENSITIVE Sensitive     AMPICILLIN/SULBACTAM <=2 SENSITIVE Sensitive     PIP/TAZO <=4 SENSITIVE Sensitive     * >=100,000 COLONIES/mL ESCHERICHIA COLI  Blood Culture ID Panel (Reflexed)     Status: Abnormal   Collection Time: 11/19/19  9:12 AM  Result Value Ref Range Status   Enterococcus species NOT DETECTED NOT DETECTED Final   Listeria monocytogenes NOT DETECTED NOT DETECTED Final   Staphylococcus species NOT DETECTED NOT DETECTED Final   Staphylococcus aureus (BCID) NOT DETECTED NOT DETECTED Final   Streptococcus species NOT DETECTED NOT DETECTED Final   Streptococcus agalactiae NOT DETECTED NOT DETECTED Final   Streptococcus pneumoniae NOT DETECTED NOT DETECTED Final   Streptococcus pyogenes NOT DETECTED NOT DETECTED Final   Acinetobacter baumannii NOT DETECTED NOT DETECTED Final   Enterobacteriaceae species DETECTED (A) NOT DETECTED Final    Comment: Enterobacteriaceae represent a large family of gram-negative bacteria, not a single organism. CRITICAL RESULT CALLED TO, READ BACK BY AND VERIFIED WITH: [name] 11/19/19 AT 2106 BY ACR    Enterobacter cloacae complex NOT DETECTED NOT DETECTED Final   Escherichia coli DETECTED (A) NOT DETECTED Final    Comment: CRITICAL RESULT CALLED TO, READ BACK BY AND VERIFIED WITH: [name] 11/19/19 AT 2106 BY ACR    Klebsiella oxytoca NOT DETECTED NOT DETECTED Final   Klebsiella pneumoniae NOT DETECTED NOT DETECTED Final   Proteus species NOT DETECTED NOT DETECTED Final   Serratia marcescens NOT DETECTED NOT DETECTED Final   Carbapenem resistance NOT DETECTED NOT DETECTED Final   Haemophilus influenzae NOT DETECTED NOT DETECTED Final    Neisseria meningitidis NOT DETECTED NOT DETECTED Final   Pseudomonas aeruginosa NOT DETECTED NOT DETECTED Final  Candida albicans NOT DETECTED NOT DETECTED Final   Candida glabrata NOT DETECTED NOT DETECTED Final   Candida krusei NOT DETECTED NOT DETECTED Final   Candida parapsilosis NOT DETECTED NOT DETECTED Final   Candida tropicalis NOT DETECTED NOT DETECTED Final    Comment: Performed at [facility], [place], [place] [address]  SARS Coronavirus 2 by RT PCR (hospital order, performed in [place] hospital lab) Nasopharyngeal Nasopharyngeal Swab     Status: None   Collection Time: 11/19/19  9:48 AM   Specimen: Nasopharyngeal Swab  Result Value Ref Range Status   SARS Coronavirus 2 NEGATIVE NEGATIVE Final    Comment: (NOTE) SARS-CoV-2 target nucleic acids are NOT DETECTED.  The SARS-CoV-2 RNA is generally detectable in upper and lower respiratory specimens during the acute phase of infection. The lowest concentration of SARS-CoV-2 viral copies this assay can detect is 250 copies / mL. A negative result does not preclude SARS-CoV-2 infection and should not be used as the sole basis for treatment or other patient management decisions.  A negative result may occur with improper specimen collection / handling, submission of specimen other than nasopharyngeal swab, presence of viral mutation(s) within the areas targeted by this assay, and inadequate number of viral copies (<250 copies / mL). A negative result must be combined with clinical observations, patient history, and epidemiological information.  Fact Sheet for Patients:   StrictlyIdeas.no  Fact Sheet for Healthcare Providers: BankingDealers.co.za  This test is not yet approved or  cleared by the [place] FDA and has been authorized for detection and/or diagnosis of SARS-CoV-2 by FDA under an Emergency Use Authorization (EUA).  This EUA will  remain in effect (meaning this test can be used) for the duration of the COVID-19 declaration under Section 564(b)(1) of the Act, 21 U.S.C. section 360bbb-3(b)(1), unless the authorization is terminated or revoked sooner.  Performed at [facility], [place], [place] [address]   C Difficile Quick Screen w PCR reflex     Status: None   Collection Time: 11/21/19  5:25 AM   Specimen: STOOL  Result Value Ref Range Status   C Diff antigen NEGATIVE NEGATIVE Final   C Diff toxin NEGATIVE NEGATIVE Final   C Diff interpretation No C. difficile detected.  Final    Comment: Performed at [facility], [address] [place], [place] [address]  Culture, blood (Routine X 2) w Reflex to ID Panel     Status: None (Preliminary result)   Collection Time: 11/21/19  5:52 PM   Specimen: BLOOD  Result Value Ref Range Status   Specimen Description BLOOD [facility]  Final   Special Requests   Final    BOTTLES DRAWN AEROBIC ONLY Blood Culture adequate volume   Culture   Final    NO GROWTH < 24 HOURS Performed at [facility], [address], [place], [place] [address]    Report Status PENDING  Incomplete  Culture, blood (Routine X 2) w Reflex to ID Panel     Status: None (Preliminary result)   Collection Time: 11/21/19  5:52 PM   Specimen: BLOOD  Result Value Ref Range Status   Specimen Description BLOOD [facility]  Final   Special Requests   Final    BOTTLES DRAWN AEROBIC ONLY Blood Culture adequate volume   Culture   Final    NO GROWTH < 24 HOURS Performed at [facility], [address], [place], [place] [address]  Report Status PENDING  Incomplete      Radiology Studies: No results found.   Scheduled Meds: . vitamin C  500 mg Oral QPM  . atorvastatin  10 mg Oral q1800  . baclofen  10 mg Oral TID  . buPROPion  300 mg Oral Daily  . Chlorhexidine Gluconate Cloth  6 each Topical Daily  . docusate sodium  100 mg Oral BID  . DULoxetine  60 mg  Oral Daily  . enoxaparin (LOVENOX) injection  40 mg Subcutaneous Q12H  . famotidine  20 mg Oral Daily  . gabapentin  600 mg Oral TID  . hydrocerin  1 application Topical BID  . magnesium oxide  400 mg Oral Daily  . mirabegron ER  50 mg Oral Daily  . mometasone-formoterol  2 puff Inhalation BID  . multivitamin with minerals  1 tablet Oral QPM  . QUEtiapine Fumarate  150 mg Oral QHS  . Teriflunomide  14 mg Oral Daily   Continuous Infusions: . ampicillin (OMNIPEN) IV 2 g (11/22/19 1831)     LOS: 3 days     Enzo Bi, MD [facility] If 7PM-7AM, please contact night-coverage 11/22/2019, 7:01 PM

## 2019-11-22 NOTE — Progress Notes (Signed)
Discussed with patient need for spouse to bring home med teriflunomide at next visit. Patient voiced concern that spouse may not bring but that she has advised him to.

## 2019-11-22 NOTE — TOC Initial Note (Signed)
Transition of Care [facility]) - Initial/Assessment Note    Patient Details  Name: [name] MRN: [id number] Date of Birth: 1950-11-16  Transition of [facility]) CM/SW Contact:    Shelbie Hutching, RN Phone Number: 11/22/2019, 3:25 PM  Clinical Narrative:                 Patient admitted to the hospital with sepsis, she is from home with a chronic foley catheter.  Patient is currently open with [facility] for PT and OT.  At discharge patient would like to continue home health services and add an aide and nursing to the orders.  Patient is against going to skilled nursing for rehab.   Patient lives with her husband and has all needed equipment at home.   RNCM will cont to follow for additional discharge needs.   Expected Discharge Plan: [place] Barriers to Discharge: Continued Medical Work up   Patient Goals and CMS Choice Patient states their goals for this hospitalization and ongoing recovery are:: Patient did not voice goals CMS Medicare.gov Compare Post Acute Care list provided to:: Patient Choice offered to / list presented to : Patient  Expected Discharge Plan and Services Expected Discharge Plan: [place]   Discharge Planning Services: CM Consult Post Acute Care Choice: Resumption of Svcs/PTA Provider Living arrangements for the past 2 months: Single Family Home                           HH Arranged: PT, OT, Nurse's Aide, RN [place] Agency: [place]        Prior Living Arrangements/Services Living arrangements for the past 2 months: Single Family Home Lives with:: Spouse Patient language and need for interpreter reviewed:: Yes Do you feel safe going back to the place where you live?: Yes      Need for Family Participation in Patient Care: Yes (Comment) (sepsis, weakness) Care giver support system in place?: Yes (comment) Current home services: DME (lift chair, wheelchair, rollator, grab bars) Criminal  Activity/Legal Involvement Pertinent to Current Situation/Hospitalization: No - Comment as needed  Activities of Daily Living Home Assistive Devices/Equipment: Environmental consultant (specify type), Wheelchair ADL Screening (condition at time of admission) Patient's cognitive ability adequate to safely complete daily activities?: Yes Is the patient deaf or have difficulty hearing?: No Does the patient have difficulty seeing, even when wearing glasses/contacts?: No Does the patient have difficulty concentrating, remembering, or making decisions?: No Patient able to express need for assistance with ADLs?: Yes Does the patient have difficulty dressing or bathing?: Yes Independently performs ADLs?: No Communication: Independent Dressing (OT): Needs assistance Is this a change from baseline?: Pre-admission baseline Grooming: Needs assistance Is this a change from baseline?: Pre-admission baseline Feeding: Independent Bathing: Needs assistance Is this a change from baseline?: Pre-admission baseline Toileting: Needs assistance Is this a change from baseline?: Pre-admission baseline In/Out Bed: Needs assistance Is this a change from baseline?: Pre-admission baseline Walks in Home: Needs assistance Is this a change from baseline?: Pre-admission baseline Does the patient have difficulty walking or climbing stairs?: Yes Weakness of Legs: Both Weakness of Arms/Hands: None  Permission Sought/Granted Permission sought to share information with : Case Manager, Other (comment) Permission granted to share information with : Yes, Verbal Permission Granted     Permission granted to share info w AGENCY: [facility]        Emotional Assessment Appearance:: Appears stated age Attitude/Demeanor/Rapport: Engaged Affect (typically  observed): Accepting Orientation: : Oriented to Self, Oriented to Place, Oriented to  Time, Oriented to Situation Alcohol / Substance Use: Not Applicable Psych Involvement: No  (comment)  Admission diagnosis:  Weakness [R53.1] Sepsis ([place]) [A41.9] Fever, unspecified fever cause [R50.9] Sepsis, due to unspecified organism, unspecified whether acute organ dysfunction present [facility]) [A41.9] Patient Active Problem List   Diagnosis Date Noted  . Chronic indwelling Foley catheter 11/19/2019  . Neuropathy   . Hypokalemia   . Hyperlipidemia   . Sepsis secondary to UTI ([place]) 10/17/2019  . Weakness   . AKI (acute kidney injury) ([place]) 04/20/2019  . Pressure injury of skin 02/03/2019  . Bilateral lower leg cellulitis 02/02/2019  . Ovarian mass, left 01/27/2019  . Sepsis ([place]) 01/05/2019  . UTI (urinary tract infection) 06/13/2018  . Altered mental status 06/11/2018  . Fall 05/13/2018  . Depression 05/13/2018  . Recurrent cellulitis of lower extremity 06/25/2017  . Medication monitoring encounter 06/05/2017  . Cellulitis of left lower leg 04/25/2017  . Adjustment disorder with mixed disturbance of emotions and conduct 04/23/2017  . Foot pain, bilateral 02/03/2017  . Tinea pedis 02/03/2017  . Left ovarian cyst 12/09/2016  . Abdominal aortic atherosclerosis ([place]) 11/11/2016  . Swelling of lower extremity 10/11/2016  . Obstructive sleep apnea 10/11/2016  . Follicular lymphoma of intra-abdominal lymph nodes ([place]) 08/06/2016  . Multiple falls 06/07/2016  . Inguinal adenopathy 05/29/2016  . Obesity, morbid ([place]) 01/05/2016  . Low HDL (under 40) 12/19/2015  . Cellulitis 12/18/2015  . Skin ulcer ([place]) 11/08/2015  . Peripheral vascular disease of lower extremity with ulceration ([place]) 11/08/2015  . CKD (chronic kidney disease) stage 3, GFR 30-59 ml/min 11/08/2015  . Constipation due to pain medication 01/31/2015  . Obstructive sleep apnea of adult 01/13/2015  . Pelvic muscle wasting 01/13/2015  . Incomplete bladder emptying 01/13/2015  . Headache, migraine 10/24/2014  . Neurogenic bladder 10/24/2014  . Current tobacco use 10/24/2014  . Lumbar radiculopathy, chronic  10/02/2013  . COPD with bronchial hyperresponsiveness ([place]) 10/02/2013  . Major depressive disorder, recurrent, in partial remission ([place]) 10/02/2013  . Essential hypertension 10/02/2013  . Multiple sclerosis ([place]) 10/02/2013  . Absence of bladder continence 09/25/2012  . Acontractile bladder 02/12/2012  . Narrowing of intervertebral disc space 11/26/2011  . Kyphoscoliosis and scoliosis 11/26/2011   PCP:  Kirk Ruths, MD Pharmacy:   [place], [place] - [place] [address] Phone: [phone] Fax: [phone]  [facility], [place] [address], [place] - [address] [place] [address] Phone: [phone] Fax: [phone]     Social Determinants of Health (SDOH) Interventions    Readmission Risk Interventions Readmission Risk Prevention Plan 04/22/2019 02/04/2019 01/07/2019  Transportation Screening Complete - Complete  PCP or Specialist Appt within 3-5 Days - - Complete  HRI or [place] - Complete Complete  Social Work Consult for Hall Planning/Counseling - Complete Complete  Palliative Care Screening - Not Applicable Not Applicable  Medication Review Press photographer) Referral to Pharmacy - Complete  PCP or Specialist appointment within 3-5 days of discharge Complete - -  [place] or Home Care Consult Complete - -  SW Recovery Care/Counseling Consult Complete - -  Palliative Care Screening Not Applicable - -  Skilled Nursing Facility Complete - -  Some recent data might be hidden

## 2019-11-23 ENCOUNTER — Encounter: Payer: Self-pay | Admitting: Internal Medicine

## 2019-11-23 DIAGNOSIS — N170 Acute kidney failure with tubular necrosis: Secondary | ICD-10-CM | POA: Diagnosis not present

## 2019-11-23 DIAGNOSIS — A419 Sepsis, unspecified organism: Secondary | ICD-10-CM | POA: Diagnosis not present

## 2019-11-23 DIAGNOSIS — R6520 Severe sepsis without septic shock: Secondary | ICD-10-CM | POA: Diagnosis not present

## 2019-11-23 LAB — BASIC METABOLIC PANEL
Anion gap: 9 (ref 5–15)
BUN: 13 mg/dL (ref 8–23)
CO2: 26 mmol/L (ref 22–32)
Calcium: 8.9 mg/dL (ref 8.9–10.3)
Chloride: 107 mmol/L (ref 98–111)
Creatinine, Ser: 1.03 mg/dL — ABNORMAL HIGH (ref 0.44–1.00)
GFR calc Af Amer: >60 mL/min (ref >60)
GFR calc non Af Amer: 56 mL/min — ABNORMAL LOW (ref >60)
Glucose, Bld: 97 mg/dL (ref 70–99)
Potassium: 3.4 mmol/L — ABNORMAL LOW (ref 3.5–5.1)
Sodium: 142 mmol/L (ref 135–145)

## 2019-11-23 LAB — CBC
HCT: 33.5 % — ABNORMAL LOW (ref 36.0–46.0)
Hemoglobin: 10.8 g/dL — ABNORMAL LOW (ref 12.0–15.0)
MCH: 28.3 pg (ref 26.0–34.0)
MCHC: 32.2 g/dL (ref 30.0–36.0)
MCV: 87.9 fL (ref 80.0–100.0)
Platelets: 149 10*3/uL — ABNORMAL LOW (ref 150–400)
RBC: 3.81 MIL/uL — ABNORMAL LOW (ref 3.87–5.11)
RDW: 14.6 % (ref 11.5–15.5)
WBC: 4.2 10*3/uL (ref 4.0–10.5)
nRBC: 0 % (ref 0.0–0.2)

## 2019-11-23 LAB — MAGNESIUM: Magnesium: 1.6 mg/dL — ABNORMAL LOW (ref 1.7–2.4)

## 2019-11-23 MED ORDER — MAGNESIUM SULFATE 2 GM/50ML IV SOLN
2.0000 g | Freq: Once | INTRAVENOUS | Status: AC
  Administered 2019-11-23: 2 g via INTRAVENOUS
  Filled 2019-11-23: qty 50

## 2019-11-23 MED ORDER — POTASSIUM CHLORIDE CRYS ER 20 MEQ PO TBCR
40.0000 meq | EXTENDED_RELEASE_TABLET | Freq: Once | ORAL | Status: AC
  Administered 2019-11-23: 40 meq via ORAL
  Filled 2019-11-23: qty 2

## 2019-11-23 NOTE — Progress Notes (Signed)
PROGRESS NOTE    [name]  YIF:[id number] DOB: May 12, 1950 DOA: 11/19/2019 PCP: Kirk Ruths, MD    Assessment & Plan:   Principal Problem:   Sepsis [facility]) Active Problems:   Essential hypertension   Neurogenic bladder   Obesity, morbid ([place])   Depression   AKI (acute kidney injury) ([place])   Chronic indwelling Foley catheter    [name] is a 69 y.o. Caucasian female with medical history significant for multiple sclerosis, neurogenic bladder with chronic indwelling Foley catheter, morbid obesity, hypertension, depression and COPD who presents to the ER for evaluation of generalized weakness which started this morning.  Patient was unable to get out of bed and called EMS.  When they arrived they found patient to be very weak and febrile with a T-max of 102.3 F.    Sepsis UTI from chronic Foley, 2/2 E coli E coli bacteremia likely from urinary source --presented with T-max of 102.3, tachycardia, elevated lactic acid level, normal white cell count with a left shift and pyuria.  Started on cefepime then switched to meropenem then de-escalated to ceftriaxone, then to ampicillin. --Urine cx grew pan-sensitive E coli.  Blood cx also pos for E coli, likely the same. --repeat blood cx x2 lbtained 7/25 PLAN: --continue IV ampicillin per cx sensitivies  Neurogenic bladder with chronic indwelling Foley catheter, POA  Urinary incontinence  --remove current foley on 7/26 --able to void  --continue mirabegron --pt encouraged to avoid Foley re-insertion  Acute kidney injury Patient's baseline serum creatinine is 0.89 and on admission it is 1.85.  AKI due to sepsis and UTI. --Cr improved with IVF hydration, since d/c'ed. PLAN: --continue to hold home Lisinopril --Hold further MIVF since pt is taking in oral hydration  Morbid obesity (BMI 86.7) Complicates overall prognosis and care  Depression Continue bupropion, clonazepam and Seroquel  History of multiple  sclerosis Continue Teriflunomide, Cymbalta and gabapentin  Continue baclofen  COPD --continue daily bronchodilators as Dulera  GERD --continue Pepcid   DVT prophylaxis: Lovenox SQ Code Status: DNR  Family Communication:  Status is: inpatient Dispo:   The patient is from: home Anticipated d/c is to: home Anticipated d/c date is: tomorrow if blood cx remain neg  Patient currently is not medically stable to d/c due to: bacteremia on IV abx, need to have repeat blood cx neg growth for 3 days   Subjective and Interval History:  Foley removed yesterday and pt has been able to void.  Reported no appetite.  No fever.   Objective: Vitals:   11/23/19 0351 11/23/19 0815 11/23/19 1139 11/23/19 1702  BP: 126/73 (!) 144/54 (!) 131/80 (!) 150/72  Pulse: 67 59 74 71  Resp: 17 16 17 17   Temp: 98.3 F (36.8 C) 98.3 F (36.8 C) 98.2 F (36.8 C) 98.3 F (36.8 C)  TempSrc: Oral     SpO2: 97% 97% 97% 96%  Weight:      Height:        Intake/Output Summary (Last 24 hours) at 11/23/2019 1729 Last data filed at 11/23/2019 1100 Gross per 24 hour  Intake 100 ml  Output 1900 ml  Net -1800 ml   Filed Weights   11/19/19 0910 11/19/19 1645  Weight: (!) 124.7 kg (!) 126.5 kg    Examination:   Constitutional: NAD, AAOx3 HEENT: conjunctivae and lids normal, EOMI CV: RRR faint heart sounds. Distal pulses +2.  No cyanosis.   RESP: CTA B/L, normal respiratory effort  GI: +BS, NTND Extremities: No effusions,  edema, or tenderness in BLE SKIN: warm, dry and intact Neuro: II - XII grossly intact.  Sensation intact Psych: Depressed mood and affect.      Data Reviewed: I have personally reviewed following labs and imaging studies  CBC: Recent Labs  Lab 11/19/19 0912 11/20/19 0419 11/21/19 0545 11/22/19 0447 11/23/19 0459  WBC 10.8* 7.7 4.5 4.0 4.2  NEUTROABS 9.3*  --   --   --   --   HGB 11.9* 10.6* 10.6* 11.3* 10.8*  HCT 38.0 32.0* 31.9* 33.7* 33.5*  MCV 89.6 87.4 86.4 85.8  87.9  PLT 137* 129* 129* 141* 409*   Basic Metabolic Panel: Recent Labs  Lab 11/19/19 0912 11/20/19 0419 11/21/19 0545 11/22/19 0447 11/23/19 0459  NA 136 137 136 142 142  K 4.3 4.2 3.7 3.6 3.4*  CL 101 103 104 107 107  CO2 23 24 24 27 26   GLUCOSE 177* 104* 98 102* 97  BUN 18 18 12 11 13   CREATININE 1.52* 1.42* 1.15* 1.03* 1.03*  CALCIUM 9.1 8.5* 8.6* 9.0 8.9  MG  --   --  1.7 1.7 1.6*   GFR: Estimated Creatinine Clearance: 67.7 mL/min (A) (by C-G formula based on SCr of 1.03 mg/dL (H)). Liver Function Tests: Recent Labs  Lab 11/19/19 0912  AST 18  ALT 15  ALKPHOS 69  BILITOT 0.7  PROT 7.6  ALBUMIN 4.0   No results for input(s): LIPASE, AMYLASE in the last 168 hours. No results for input(s): AMMONIA in the last 168 hours. Coagulation Profile: Recent Labs  Lab 11/19/19 0912 11/20/19 0419  INR 1.0 1.1   Cardiac Enzymes: No results for input(s): CKTOTAL, CKMB, CKMBINDEX, TROPONINI in the last 168 hours. BNP (last 3 results) No results for input(s): PROBNP in the last 8760 hours. HbA1C: No results for input(s): HGBA1C in the last 72 hours. CBG: No results for input(s): GLUCAP in the last 168 hours. Lipid Profile: No results for input(s): CHOL, HDL, LDLCALC, TRIG, CHOLHDL, LDLDIRECT in the last 72 hours. Thyroid Function Tests: No results for input(s): TSH, T4TOTAL, FREET4, T3FREE, THYROIDAB in the last 72 hours. Anemia Panel: No results for input(s): VITAMINB12, FOLATE, FERRITIN, TIBC, IRON, RETICCTPCT in the last 72 hours. Sepsis Labs: Recent Labs  Lab 11/19/19 0912 11/19/19 1127 11/20/19 0419  PROCALCITON  --   --  1.07  LATICACIDVEN 2.1* 1.2  --     Recent Results (from the past 240 hour(s))  Blood Culture (routine x 2)     Status: None (Preliminary result)   Collection Time: 11/19/19  9:12 AM   Specimen: BLOOD  Result Value Ref Range Status   Specimen Description BLOOD BLOOD LEFT FOREARM  Final   Special Requests   Final    BOTTLES DRAWN  AEROBIC AND ANAEROBIC Blood Culture adequate volume   Culture   Final    NO GROWTH 4 DAYS Performed at [facility], [address] [place], [place] [address]    Report Status PENDING  Incomplete  Blood Culture (routine x 2)     Status: Abnormal   Collection Time: 11/19/19  9:12 AM   Specimen: BLOOD  Result Value Ref Range Status   Specimen Description   Final    BLOOD LEFT ANTECUBITAL Performed at [facility], [address], [place], [place] [address]    Special Requests   Final    BOTTLES DRAWN AEROBIC AND ANAEROBIC Blood Culture adequate volume Performed at [facility], [address] [place], [place] [address]  Culture  Setup Time   Final    GRAM NEGATIVE RODS AEROBIC BOTTLE ONLY Performed at [facility], [place] [address] [place], [place] [address]    Culture ESCHERICHIA COLI (A)  Final   Report Status 11/22/2019 FINAL  Final   Organism ID, Bacteria ESCHERICHIA COLI  Final      Susceptibility   Escherichia coli - MIC*    AMPICILLIN <=2 SENSITIVE Sensitive     CEFAZOLIN <=4 SENSITIVE Sensitive     CEFEPIME <=0.12 SENSITIVE Sensitive     CEFTAZIDIME <=1 SENSITIVE Sensitive     CEFTRIAXONE <=0.25 SENSITIVE Sensitive     CIPROFLOXACIN <=0.25 SENSITIVE Sensitive     GENTAMICIN <=1 SENSITIVE Sensitive     IMIPENEM <=0.25 SENSITIVE Sensitive     TRIMETH/SULFA <=20 SENSITIVE Sensitive     AMPICILLIN/SULBACTAM <=2 SENSITIVE Sensitive     PIP/TAZO <=4 SENSITIVE Sensitive     * ESCHERICHIA COLI  Urine culture     Status: Abnormal   Collection Time: 11/19/19  9:12 AM   Specimen: In/Out Cath Urine  Result Value Ref Range Status   Specimen Description   Final    IN/OUT CATH URINE Performed at [facility], [address] [place], [place] [address]    Special Requests   Final    NONE Performed at [facility], [address] [place], [place] [address]    Culture >=100,000 COLONIES/mL ESCHERICHIA COLI (A)  Final    Report Status 11/21/2019 FINAL  Final   Organism ID, Bacteria ESCHERICHIA COLI (A)  Final      Susceptibility   Escherichia coli - MIC*    AMPICILLIN <=2 SENSITIVE Sensitive     CEFAZOLIN <=4 SENSITIVE Sensitive     CEFTRIAXONE <=0.25 SENSITIVE Sensitive     CIPROFLOXACIN <=0.25 SENSITIVE Sensitive     GENTAMICIN <=1 SENSITIVE Sensitive     IMIPENEM <=0.25 SENSITIVE Sensitive     NITROFURANTOIN <=16 SENSITIVE Sensitive     TRIMETH/SULFA <=20 SENSITIVE Sensitive     AMPICILLIN/SULBACTAM <=2 SENSITIVE Sensitive     PIP/TAZO <=4 SENSITIVE Sensitive     * >=100,000 COLONIES/mL ESCHERICHIA COLI  Blood Culture ID Panel (Reflexed)     Status: Abnormal   Collection Time: 11/19/19  9:12 AM  Result Value Ref Range Status   Enterococcus species NOT DETECTED NOT DETECTED Final   Listeria monocytogenes NOT DETECTED NOT DETECTED Final   Staphylococcus species NOT DETECTED NOT DETECTED Final   Staphylococcus aureus (BCID) NOT DETECTED NOT DETECTED Final   Streptococcus species NOT DETECTED NOT DETECTED Final   Streptococcus agalactiae NOT DETECTED NOT DETECTED Final   Streptococcus pneumoniae NOT DETECTED NOT DETECTED Final   Streptococcus pyogenes NOT DETECTED NOT DETECTED Final   Acinetobacter baumannii NOT DETECTED NOT DETECTED Final   Enterobacteriaceae species DETECTED (A) NOT DETECTED Final    Comment: Enterobacteriaceae represent a large family of gram-negative bacteria, not a single organism. CRITICAL RESULT CALLED TO, READ BACK BY AND VERIFIED WITH: JASON ROBBINS 11/19/19 AT 2106 BY ACR    Enterobacter cloacae complex NOT DETECTED NOT DETECTED Final   Escherichia coli DETECTED (A) NOT DETECTED Final    Comment: CRITICAL RESULT CALLED TO, READ BACK BY AND VERIFIED WITH: JASON ROBBINS 11/19/19 AT 2106 BY ACR    Klebsiella oxytoca NOT DETECTED NOT DETECTED Final   Klebsiella pneumoniae NOT DETECTED NOT DETECTED Final   Proteus species NOT DETECTED NOT DETECTED Final   Serratia  marcescens NOT DETECTED NOT DETECTED Final  Carbapenem resistance NOT DETECTED NOT DETECTED Final   Haemophilus influenzae NOT DETECTED NOT DETECTED Final   Neisseria meningitidis NOT DETECTED NOT DETECTED Final   Pseudomonas aeruginosa NOT DETECTED NOT DETECTED Final   Candida albicans NOT DETECTED NOT DETECTED Final   Candida glabrata NOT DETECTED NOT DETECTED Final   Candida krusei NOT DETECTED NOT DETECTED Final   Candida parapsilosis NOT DETECTED NOT DETECTED Final   Candida tropicalis NOT DETECTED NOT DETECTED Final    Comment: Performed at [facility], [place], [place] [address]  SARS Coronavirus 2 by RT PCR (hospital order, performed in [facility] hospital lab) Nasopharyngeal Nasopharyngeal Swab     Status: None   Collection Time: 11/19/19  9:48 AM   Specimen: Nasopharyngeal Swab  Result Value Ref Range Status   SARS Coronavirus 2 NEGATIVE NEGATIVE Final    Comment: (NOTE) SARS-CoV-2 target nucleic acids are NOT DETECTED.  The SARS-CoV-2 RNA is generally detectable in upper and lower respiratory specimens during the acute phase of infection. The lowest concentration of SARS-CoV-2 viral copies this assay can detect is 250 copies / mL. A negative result does not preclude SARS-CoV-2 infection and should not be used as the sole basis for treatment or other patient management decisions.  A negative result may occur with improper specimen collection / handling, submission of specimen other than nasopharyngeal swab, presence of viral mutation(s) within the areas targeted by this assay, and inadequate number of viral copies (<250 copies / mL). A negative result must be combined with clinical observations, patient history, and epidemiological information.  Fact Sheet for Patients:   StrictlyIdeas.no  Fact Sheet for Healthcare Providers: BankingDealers.co.za  This test is not yet approved or  cleared by the  [place] FDA and has been authorized for detection and/or diagnosis of SARS-CoV-2 by FDA under an Emergency Use Authorization (EUA).  This EUA will remain in effect (meaning this test can be used) for the duration of the COVID-19 declaration under Section 564(b)(1) of the Act, 21 U.S.C. section 360bbb-3(b)(1), unless the authorization is terminated or revoked sooner.  Performed at [facility], [place], [place] [address]   C Difficile Quick Screen w PCR reflex     Status: None   Collection Time: 11/21/19  5:25 AM   Specimen: STOOL  Result Value Ref Range Status   C Diff antigen NEGATIVE NEGATIVE Final   C Diff toxin NEGATIVE NEGATIVE Final   C Diff interpretation No C. difficile detected.  Final    Comment: Performed at [facility], [address] [place], [place] [address]  Culture, blood (Routine X 2) w Reflex to ID Panel     Status: None (Preliminary result)   Collection Time: 11/21/19  5:52 PM   Specimen: BLOOD  Result Value Ref Range Status   Specimen Description BLOOD [facility]  Final   Special Requests   Final    BOTTLES DRAWN AEROBIC ONLY Blood Culture adequate volume   Culture   Final    NO GROWTH 2 DAYS Performed at [facility], [address] [place], [place] [address]    Report Status PENDING  Incomplete  Culture, blood (Routine X 2) w Reflex to ID Panel     Status: None (Preliminary result)   Collection Time: 11/21/19  5:52 PM   Specimen: BLOOD  Result Value Ref Range Status   Specimen Description BLOOD [facility]  Final   Special Requests   Final    BOTTLES DRAWN  AEROBIC ONLY Blood Culture adequate volume   Culture   Final    NO GROWTH 2 DAYS Performed at [facility], [address], [place], [place] [address]    Report Status PENDING  Incomplete      Radiology Studies: No results found.   Scheduled Meds: . vitamin C  500 mg Oral QPM  . atorvastatin  10 mg Oral q1800  . baclofen  10 mg Oral TID  .  buPROPion  300 mg Oral Daily  . Chlorhexidine Gluconate Cloth  6 each Topical Daily  . docusate sodium  100 mg Oral BID  . DULoxetine  60 mg Oral Daily  . enoxaparin (LOVENOX) injection  40 mg Subcutaneous Q12H  . famotidine  20 mg Oral Daily  . gabapentin  600 mg Oral TID  . hydrocerin  1 application Topical BID  . magnesium oxide  400 mg Oral Daily  . mirabegron ER  50 mg Oral Daily  . mometasone-formoterol  2 puff Inhalation BID  . multivitamin with minerals  1 tablet Oral QPM  . QUEtiapine Fumarate  150 mg Oral QHS  . Teriflunomide  14 mg Oral Daily   Continuous Infusions: . ampicillin (OMNIPEN) IV 2 g (11/23/19 1719)     LOS: 4 days     Enzo Bi, MD [facility] If 7PM-7AM, please contact night-coverage 11/23/2019, 5:29 PM

## 2019-11-24 LAB — BASIC METABOLIC PANEL
Anion gap: 10 (ref 5–15)
BUN: 18 mg/dL (ref 8–23)
CO2: 28 mmol/L (ref 22–32)
Calcium: 9.3 mg/dL (ref 8.9–10.3)
Chloride: 103 mmol/L (ref 98–111)
Creatinine, Ser: 1.05 mg/dL — ABNORMAL HIGH (ref 0.44–1.00)
GFR calc Af Amer: >60 mL/min (ref >60)
GFR calc non Af Amer: 55 mL/min — ABNORMAL LOW (ref >60)
Glucose, Bld: 107 mg/dL — ABNORMAL HIGH (ref 70–99)
Potassium: 3.9 mmol/L (ref 3.5–5.1)
Sodium: 141 mmol/L (ref 135–145)

## 2019-11-24 LAB — CBC
HCT: 35.1 % — ABNORMAL LOW (ref 36.0–46.0)
Hemoglobin: 11.8 g/dL — ABNORMAL LOW (ref 12.0–15.0)
MCH: 28.8 pg (ref 26.0–34.0)
MCHC: 33.6 g/dL (ref 30.0–36.0)
MCV: 85.6 fL (ref 80.0–100.0)
Platelets: 154 10*3/uL (ref 150–400)
RBC: 4.1 MIL/uL (ref 3.87–5.11)
RDW: 14.9 % (ref 11.5–15.5)
WBC: 4.5 10*3/uL (ref 4.0–10.5)
nRBC: 0 % (ref 0.0–0.2)

## 2019-11-24 LAB — CULTURE, BLOOD (ROUTINE X 2)
Culture: NO GROWTH
Special Requests: ADEQUATE

## 2019-11-24 LAB — MAGNESIUM: Magnesium: 2.1 mg/dL (ref 1.7–2.4)

## 2019-11-24 MED ORDER — CIPROFLOXACIN HCL 500 MG PO TABS: 500.0000 mg | ORAL_TABLET | Freq: Two times a day (BID) | ORAL | 0 refills | Status: AC

## 2019-11-24 MED ORDER — AMLODIPINE BESYLATE 5 MG PO TABS
5.0000 mg | ORAL_TABLET | Freq: Every day | ORAL | Status: DC
  Administered 2019-11-24: 13:00:00 5 mg via ORAL
  Filled 2019-11-24: qty 1

## 2019-11-24 MED ORDER — DOCUSATE SODIUM 100 MG PO CAPS: 100.0000 mg | ORAL_CAPSULE | Freq: Two times a day (BID) | ORAL | Status: AC | PRN

## 2019-11-24 MED ORDER — CIPROFLOXACIN HCL 500 MG PO TABS
500.0000 mg | ORAL_TABLET | Freq: Two times a day (BID) | ORAL | Status: DC
  Administered 2019-11-24: 500 mg via ORAL
  Filled 2019-11-24: qty 1

## 2019-11-24 NOTE — Care Management Important Message (Signed)
Important Message  Patient Details  Name: [name] MRN: [id number] Date of Birth: Sep 21, 1950   Medicare Important Message Given:  Yes     [name] 11/24/2019, 10:03 AM

## 2019-11-24 NOTE — Plan of Care (Signed)
  Problem: Education: Goal: Knowledge of General Education information will improve Description: Including pain rating scale, medication(s)/side effects and non-pharmacologic comfort measures Outcome: Progressing   Problem: Health Behavior/Discharge Planning: Goal: Ability to manage health-related needs will improve Outcome: Progressing   Problem: Clinical Measurements: Goal: Ability to maintain clinical measurements within normal limits will improve Outcome: Progressing Goal: Will remain free from infection Outcome: Progressing Goal: Diagnostic test results will improve Outcome: Progressing Goal: Respiratory complications will improve Outcome: Progressing Goal: Cardiovascular complication will be avoided Outcome: Progressing   Problem: Clinical Measurements: Goal: Will remain free from infection Outcome: Progressing   Problem: Clinical Measurements: Goal: Diagnostic test results will improve Outcome: Progressing   Problem: Clinical Measurements: Goal: Respiratory complications will improve Outcome: Progressing   Problem: Clinical Measurements: Goal: Respiratory complications will improve Outcome: Progressing

## 2019-11-24 NOTE — Discharge Summary (Signed)
Physician Discharge Summary   [name]  female DOB: 03/07/1951  NTI:[id number]  PCP: Kirk Ruths, MD  Admit date: 11/19/2019 Discharge date: 11/24/2019  Admitted From: home Disposition:  home Home Health: Yes CODE STATUS: DNR  Discharge Instructions    Discharge instructions   Complete by: As directed    You have had 5 days of IV antibiotic for your blood infection and urinary track infection.  Please finish 5 more days of oral Cipro at home as directed.  We have removed your chronic Foley catheter and you have been able to void on your own.  It will be better for you to avoid chronic Foley placement and use scheduled voiding (sit on toilet every 4 hours or so to empty your bladder) and adult diapers or pads at night for incontinence.     Dr. Enzo Bi - -   No wound care   Complete by: As directed        Hospital Course:  For full details, please see H&P, progress notes, consult notes and ancillary notes.  Briefly,  [name] a 69 y.o.Caucasian femalewith medical history significant formultiple sclerosis,neurogenic bladder with chronic indwelling Foley catheter, morbid obesity, hypertension, depression and COPDwhopresented to the ER for evaluation of generalized weakness which started morning of presentation. Patient was unable to get out of bed and called EMS. When they arrived they found patient to be very weak and febrile with a T-max of 102.3 F.    # Sepsis # UTI from chronic Foley, 2/2 E coli # E coli bacteremia from urinary source Pt presented with T-max of 102.3, tachycardia, elevated lactic acid level,normal white cell count with a left shift and pyuria.  Started on cefepime then switched to meropenem then de-escalated to ceftriaxone, then to ampicillin.  Urine cx grew pan-sensitive E coli.  Blood cx also pos for E coli, likely from urinary source.  Repeat blood cx x2 from 7/25 remained neg growth for 3 days prior to discharge.  Pt  received 5 days of IV abx and was discharged on 5 more days of Cipro.  Neurogenic bladder with chronic indwelling Foley catheter, POA  Urinary incontinence  Removed foley on 7/26.  Pt was able to void.  continued mirabegron.  Pt was advised to stay off Foley and use scheduled voiding and incontinence diapers at night to manage urinary incontinence.  Acute kidney injury Patient's baseline serum creatinineis0.89 and on admission it is 1.85.  AKI due to sepsis and UTI.  Home Lisinopril held during hosptialization.  Cr improved with IVF hydration.  Cr 1.05 on the day of discharge.    Morbid obesity(BMI 86.7) Complicates overall prognosis and care  Depression Continued bupropion,clonazepam and Seroquel  History of multiple sclerosis ContinuedTeriflunomide,Cymbalta, gabapentin and baclofen.  COPD Stable.  continued daily bronchodilators as Dulera  GERD continued Pepcid   Discharge Diagnoses:  Principal Problem:   Sepsis ([place]) Active Problems:   Essential hypertension   Neurogenic bladder   Obesity, morbid ([place])   Depression   AKI (acute kidney injury) ([place])   Chronic indwelling Foley catheter    Discharge Instructions:  Allergies as of 11/24/2019   No Known Allergies     Medication List    TAKE these medications   amLODipine 5 MG tablet Commonly known as: NORVASC Take 5 mg by mouth daily.   atorvastatin 10 MG tablet Commonly known as: LIPITOR Take 10 mg by mouth daily.   baclofen 10 MG tablet Commonly known as: LIORESAL  Take 10 mg by mouth 3 (three) times daily.   buPROPion 300 MG 24 hr tablet Commonly known as: WELLBUTRIN XL Take 300 mg by mouth daily.   ciprofloxacin 500 MG tablet Commonly known as: CIPRO Take 1 tablet (500 mg total) by mouth 2 (two) times daily for 5 days. Antibiotic for your blood infection and urinary track infection.   clonazePAM 1 MG tablet Commonly known as: KLONOPIN Take 0.5 tablets (0.5 mg total) by mouth at  bedtime. What changed:   how much to take  when to take this  reasons to take this   docusate sodium 100 MG capsule Commonly known as: COLACE Take 1 capsule (100 mg total) by mouth 2 (two) times daily as needed.   DULoxetine 60 MG capsule Commonly known as: CYMBALTA Take 1 capsule (60 mg total) by mouth every morning.   famotidine 20 MG tablet Commonly known as: PEPCID Take 1 tablet (20 mg total) by mouth daily.   gabapentin 600 MG tablet Commonly known as: NEURONTIN Take 1 tablet (600 mg total) by mouth 3 (three) times daily.   hydrALAZINE 50 MG tablet Commonly known as: APRESOLINE Take 50 mg by mouth 3 (three) times daily.   lisinopril 20 MG tablet Commonly known as: ZESTRIL Take 20 mg by mouth daily.   magnesium oxide 400 (241.3 Mg) MG tablet Commonly known as: MAG-OX Take 1 tablet (400 mg total) by mouth daily.   Myrbetriq 50 MG Tb24 tablet Generic drug: mirabegron ER TAKE ONE TABLET BY MOUTH ONCE DAILY What changed:   how much to take  how to take this  when to take this  additional instructions   QUEtiapine Fumarate 150 MG 24 hr tablet Commonly known as: SEROQUEL XR Take 150 mg by mouth at bedtime.        Follow-up Information    Kirk Ruths, MD. Schedule an appointment as soon as possible for a visit in 1 week(s).   Specialty: Internal Medicine Contact information: [place] [address] [phone]               No Known Allergies   The results of significant diagnostics from this hospitalization (including imaging, microbiology, ancillary and laboratory) are listed below for reference.   Consultations:   Procedures/Studies: DG Chest Port 1 View  Result Date: 11/19/2019 CLINICAL DATA:  Fever. EXAM: PORTABLE CHEST 1 VIEW COMPARISON:  04/20/2019 FINDINGS: The cardiomediastinal silhouette is within normal limits for portable AP technique and slightly low lung volumes. The interstitial markings  are chronically slightly prominent without overt edema, airspace consolidation, sizeable pleural effusion, or pneumothorax identified. No acute osseous abnormality is seen. IMPRESSION: No active disease. Electronically Signed   By: Logan Bores M.D.   On: 11/19/2019 09:55      Labs: BNP (last 3 results) No results for input(s): BNP in the last 8760 hours. Basic Metabolic Panel: Recent Labs  Lab 11/20/19 0419 11/21/19 0545 11/22/19 0447 11/23/19 0459 11/24/19 0614  NA 137 136 142 142 141  K 4.2 3.7 3.6 3.4* 3.9  CL 103 104 107 107 103  CO2 24 24 27 26 28   GLUCOSE 104* 98 102* 97 107*  BUN 18 12 11 13 18   CREATININE 1.42* 1.15* 1.03* 1.03* 1.05*  CALCIUM 8.5* 8.6* 9.0 8.9 9.3  MG  --  1.7 1.7 1.6* 2.1   Liver Function Tests: Recent Labs  Lab 11/19/19 0912  AST 18  ALT 15  ALKPHOS 69  BILITOT 0.7  PROT  7.6  ALBUMIN 4.0   No results for input(s): LIPASE, AMYLASE in the last 168 hours. No results for input(s): AMMONIA in the last 168 hours. CBC: Recent Labs  Lab 11/19/19 0912 11/19/19 0912 11/20/19 0419 11/21/19 0545 11/22/19 0447 11/23/19 0459 11/24/19 0614  WBC 10.8*   < > 7.7 4.5 4.0 4.2 4.5  NEUTROABS 9.3*  --   --   --   --   --   --   HGB 11.9*   < > 10.6* 10.6* 11.3* 10.8* 11.8*  HCT 38.0   < > 32.0* 31.9* 33.7* 33.5* 35.1*  MCV 89.6   < > 87.4 86.4 85.8 87.9 85.6  PLT 137*   < > 129* 129* 141* 149* 154   < > = values in this interval not displayed.   Cardiac Enzymes: No results for input(s): CKTOTAL, CKMB, CKMBINDEX, TROPONINI in the last 168 hours. BNP: Invalid input(s): POCBNP CBG: No results for input(s): GLUCAP in the last 168 hours. D-Dimer No results for input(s): DDIMER in the last 72 hours. Hgb A1c No results for input(s): HGBA1C in the last 72 hours. Lipid Profile No results for input(s): CHOL, HDL, LDLCALC, TRIG, CHOLHDL, LDLDIRECT in the last 72 hours. Thyroid function studies No results for input(s): TSH, T4TOTAL, T3FREE, THYROIDAB  in the last 72 hours.  Invalid input(s): FREET3 Anemia work up No results for input(s): VITAMINB12, FOLATE, FERRITIN, TIBC, IRON, RETICCTPCT in the last 72 hours. Urinalysis    Component Value Date/Time   COLORURINE YELLOW (A) 11/19/2019 0912   APPEARANCEUR TURBID (A) 11/19/2019 0912   APPEARANCEUR Hazy 08/02/2013 0020   LABSPEC 1.011 11/19/2019 0912   LABSPEC 1.012 08/02/2013 0020   PHURINE 6.0 11/19/2019 0912   GLUCOSEU NEGATIVE 11/19/2019 0912   GLUCOSEU Negative 08/02/2013 0020   HGBUR SMALL (A) 11/19/2019 0912   BILIRUBINUR NEGATIVE 11/19/2019 0912   BILIRUBINUR Negative 08/02/2013 0020   KETONESUR NEGATIVE 11/19/2019 0912   PROTEINUR 100 (A) 11/19/2019 0912   NITRITE NEGATIVE 11/19/2019 0912   LEUKOCYTESUR LARGE (A) 11/19/2019 0912   LEUKOCYTESUR 2+ 08/02/2013 0020   Sepsis Labs Invalid input(s): PROCALCITONIN,  WBC,  LACTICIDVEN Microbiology Recent Results (from the past 240 hour(s))  Blood Culture (routine x 2)     Status: None   Collection Time: 11/19/19  9:12 AM   Specimen: BLOOD  Result Value Ref Range Status   Specimen Description BLOOD BLOOD LEFT FOREARM  Final   Special Requests   Final    BOTTLES DRAWN AEROBIC AND ANAEROBIC Blood Culture adequate volume   Culture   Final    NO GROWTH 5 DAYS Performed at [facility], [address] [place], [place] [address]    Report Status 11/24/2019 FINAL  Final  Blood Culture (routine x 2)     Status: Abnormal   Collection Time: 11/19/19  9:12 AM   Specimen: BLOOD  Result Value Ref Range Status   Specimen Description   Final    BLOOD LEFT ANTECUBITAL Performed at [facility], [address], [place], [place] [address]    Special Requests   Final    BOTTLES DRAWN AEROBIC AND ANAEROBIC Blood Culture adequate volume Performed at [facility], [address], [place], [place] [address]    Culture  Setup Time   Final    GRAM NEGATIVE RODS AEROBIC BOTTLE ONLY Performed at [facility], [place] [address], [place], [place] [address]    Culture ESCHERICHIA COLI (A)  Final  Report Status 11/22/2019 FINAL  Final   Organism ID, Bacteria ESCHERICHIA COLI  Final      Susceptibility   Escherichia coli - MIC*    AMPICILLIN <=2 SENSITIVE Sensitive     CEFAZOLIN <=4 SENSITIVE Sensitive     CEFEPIME <=0.12 SENSITIVE Sensitive     CEFTAZIDIME <=1 SENSITIVE Sensitive     CEFTRIAXONE <=0.25 SENSITIVE Sensitive     CIPROFLOXACIN <=0.25 SENSITIVE Sensitive     GENTAMICIN <=1 SENSITIVE Sensitive     IMIPENEM <=0.25 SENSITIVE Sensitive     TRIMETH/SULFA <=20 SENSITIVE Sensitive     AMPICILLIN/SULBACTAM <=2 SENSITIVE Sensitive     PIP/TAZO <=4 SENSITIVE Sensitive     * ESCHERICHIA COLI  Urine culture     Status: Abnormal   Collection Time: 11/19/19  9:12 AM   Specimen: In/Out Cath Urine  Result Value Ref Range Status   Specimen Description   Final    IN/OUT CATH URINE Performed at [facility], [address] [place], [place] [address]    Special Requests   Final    NONE Performed at [facility], [address] [place], [place] [address]    Culture >=100,000 COLONIES/mL ESCHERICHIA COLI (A)  Final   Report Status 11/21/2019 FINAL  Final   Organism ID, Bacteria ESCHERICHIA COLI (A)  Final      Susceptibility   Escherichia coli - MIC*    AMPICILLIN <=2 SENSITIVE Sensitive     CEFAZOLIN <=4 SENSITIVE Sensitive     CEFTRIAXONE <=0.25 SENSITIVE Sensitive     CIPROFLOXACIN <=0.25 SENSITIVE Sensitive     GENTAMICIN <=1 SENSITIVE Sensitive     IMIPENEM <=0.25 SENSITIVE Sensitive     NITROFURANTOIN <=16 SENSITIVE Sensitive     TRIMETH/SULFA <=20 SENSITIVE Sensitive     AMPICILLIN/SULBACTAM <=2 SENSITIVE Sensitive     PIP/TAZO <=4 SENSITIVE Sensitive     * >=100,000 COLONIES/mL ESCHERICHIA COLI  Blood Culture ID Panel (Reflexed)     Status: Abnormal   Collection Time: 11/19/19  9:12 AM  Result Value Ref Range Status   Enterococcus species NOT  DETECTED NOT DETECTED Final   Listeria monocytogenes NOT DETECTED NOT DETECTED Final   Staphylococcus species NOT DETECTED NOT DETECTED Final   Staphylococcus aureus (BCID) NOT DETECTED NOT DETECTED Final   Streptococcus species NOT DETECTED NOT DETECTED Final   Streptococcus agalactiae NOT DETECTED NOT DETECTED Final   Streptococcus pneumoniae NOT DETECTED NOT DETECTED Final   Streptococcus pyogenes NOT DETECTED NOT DETECTED Final   Acinetobacter baumannii NOT DETECTED NOT DETECTED Final   Enterobacteriaceae species DETECTED (A) NOT DETECTED Final    Comment: Enterobacteriaceae represent a large family of gram-negative bacteria, not a single organism. CRITICAL RESULT CALLED TO, READ BACK BY AND VERIFIED WITH: JASON ROBBINS 11/19/19 AT 2106 BY ACR    Enterobacter cloacae complex NOT DETECTED NOT DETECTED Final   Escherichia coli DETECTED (A) NOT DETECTED Final    Comment: CRITICAL RESULT CALLED TO, READ BACK BY AND VERIFIED WITH: JASON ROBBINS 11/19/19 AT 2106 BY ACR    Klebsiella oxytoca NOT DETECTED NOT DETECTED Final   Klebsiella pneumoniae NOT DETECTED NOT DETECTED Final   Proteus species NOT DETECTED NOT DETECTED Final   Serratia marcescens NOT DETECTED NOT DETECTED Final   Carbapenem resistance NOT DETECTED NOT DETECTED Final   Haemophilus influenzae NOT DETECTED NOT DETECTED Final   Neisseria meningitidis NOT DETECTED NOT DETECTED Final   Pseudomonas aeruginosa NOT DETECTED NOT DETECTED Final   Candida albicans NOT DETECTED NOT  DETECTED Final   Candida glabrata NOT DETECTED NOT DETECTED Final   Candida krusei NOT DETECTED NOT DETECTED Final   Candida parapsilosis NOT DETECTED NOT DETECTED Final   Candida tropicalis NOT DETECTED NOT DETECTED Final    Comment: Performed at [facility], [place], [place] [address]  SARS Coronavirus 2 by RT PCR (hospital order, performed in [place] hospital lab) Nasopharyngeal Nasopharyngeal Swab     Status: None    Collection Time: 11/19/19  9:48 AM   Specimen: Nasopharyngeal Swab  Result Value Ref Range Status   SARS Coronavirus 2 NEGATIVE NEGATIVE Final    Comment: (NOTE) SARS-CoV-2 target nucleic acids are NOT DETECTED.  The SARS-CoV-2 RNA is generally detectable in upper and lower respiratory specimens during the acute phase of infection. The lowest concentration of SARS-CoV-2 viral copies this assay can detect is 250 copies / mL. A negative result does not preclude SARS-CoV-2 infection and should not be used as the sole basis for treatment or other patient management decisions.  A negative result may occur with improper specimen collection / handling, submission of specimen other than nasopharyngeal swab, presence of viral mutation(s) within the areas targeted by this assay, and inadequate number of viral copies (<250 copies / mL). A negative result must be combined with clinical observations, patient history, and epidemiological information.  Fact Sheet for Patients:   StrictlyIdeas.no  Fact Sheet for Healthcare Providers: BankingDealers.co.za  This test is not yet approved or  cleared by the [place] FDA and has been authorized for detection and/or diagnosis of SARS-CoV-2 by FDA under an Emergency Use Authorization (EUA).  This EUA will remain in effect (meaning this test can be used) for the duration of the COVID-19 declaration under Section 564(b)(1) of the Act, 21 U.S.C. section 360bbb-3(b)(1), unless the authorization is terminated or revoked sooner.  Performed at [facility], [place], [place] [address]   C Difficile Quick Screen w PCR reflex     Status: None   Collection Time: 11/21/19  5:25 AM   Specimen: STOOL  Result Value Ref Range Status   C Diff antigen NEGATIVE NEGATIVE Final   C Diff toxin NEGATIVE NEGATIVE Final   C Diff interpretation No C. difficile detected.  Final    Comment:  Performed at [facility], [address] [place], [place] [address]  Culture, blood (Routine X 2) w Reflex to ID Panel     Status: None (Preliminary result)   Collection Time: 11/21/19  5:52 PM   Specimen: BLOOD  Result Value Ref Range Status   Specimen Description BLOOD [facility]  Final   Special Requests   Final    BOTTLES DRAWN AEROBIC ONLY Blood Culture adequate volume   Culture   Final    NO GROWTH 3 DAYS Performed at [facility], [address] [place], [place] [address]    Report Status PENDING  Incomplete  Culture, blood (Routine X 2) w Reflex to ID Panel     Status: None (Preliminary result)   Collection Time: 11/21/19  5:52 PM   Specimen: BLOOD  Result Value Ref Range Status   Specimen Description BLOOD [facility]  Final   Special Requests   Final    BOTTLES DRAWN AEROBIC ONLY Blood Culture adequate volume   Culture   Final    NO GROWTH 3 DAYS Performed at [facility], [address] [place], [place] [address]    Report Status PENDING  Incomplete  Total time spend on discharging this patient, including the last patient exam, discussing the hospital stay, instructions for ongoing care as it relates to all pertinent caregivers, as well as preparing the medical discharge records, prescriptions, and/or referrals as applicable, is 35 minutes.    Enzo Bi, MD  [facility] 11/24/2019, 9:51 AM  If 7PM-7AM, please contact night-coverage

## 2019-11-24 NOTE — Progress Notes (Signed)
IV removed from patient. Discharge instructions given to patient. Verbalized understanding. No acute distress at this time. Patients husband to transport her home.

## 2019-11-24 NOTE — TOC Transition Note (Signed)
Transition of Care [facility]) - CM/SW Discharge Note   Patient Details  Name: [name] MRN: [id number] Date of Birth: 1950-10-04  [facility]) CM/SW Contact:  Shelbie Hutching, RN Phone Number: 11/24/2019, 10:27 AM   Clinical Narrative:    Patient is medically cleared for discharge home today.  Patient is discharging home with home health services through [facility].  Patient's husband or daughter will be picking her up.    Final next level of care: [place] Barriers to Discharge: Barriers Resolved   Patient Goals and CMS Choice Patient states their goals for this hospitalization and ongoing recovery are:: Patient did not voice goals CMS Medicare.gov Compare Post Acute Care list provided to:: Patient Choice offered to / list presented to : Patient  Discharge Placement                       Discharge Plan and Services   Discharge Planning Services: CM Consult Post Acute Care Choice: Resumption of Svcs/PTA Provider                    HH Arranged: PT, OT, Nurse's Aide HH Agency: [place] Date [place] Housing: 11/24/19 Time HH Agency Contacted: 43 Representative spoke with at [place]: [place] (National) Interventions     Readmission Risk Interventions Readmission Risk Prevention Plan 04/22/2019 02/04/2019 01/07/2019  Transportation Screening Complete - Complete  PCP or Specialist Appt within 3-5 Days - - Complete  HRI or [place] - Complete Complete  Social Work Consult for [employer] Planning/Counseling - Complete Complete  Palliative Care Screening - Not Applicable Not Applicable  Medication Review [employer] photographer) Referral to Pharmacy - Complete  PCP or Specialist appointment within 3-5 days of discharge Complete - -  [place] or Home Care Consult Complete - -  SW Recovery Care/Counseling Consult Complete - -  Palliative Care Screening Not Applicable - -  Skilled  Nursing Facility Complete - -  Some recent data might be hidden

## 2019-11-26 LAB — CULTURE, BLOOD (ROUTINE X 2)
Culture: NO GROWTH
Culture: NO GROWTH
Special Requests: ADEQUATE
Special Requests: ADEQUATE

## 2019-12-31 ENCOUNTER — Inpatient Hospital Stay
Admission: EM | Admit: 2019-12-31 | Discharge: 2020-01-06 | DRG: 698 | Disposition: A | Discharge status: Skilled Nursing Facility | Payer: Medicare Other | Attending: Internal Medicine | Admitting: Internal Medicine

## 2019-12-31 ENCOUNTER — Other Ambulatory Visit: Payer: Self-pay

## 2019-12-31 ENCOUNTER — Emergency Department: Payer: Medicare Other

## 2019-12-31 DIAGNOSIS — E66813 Obesity, class 3: Secondary | ICD-10-CM | POA: Diagnosis present

## 2019-12-31 DIAGNOSIS — E876 Hypokalemia: Secondary | ICD-10-CM | POA: Diagnosis not present

## 2019-12-31 DIAGNOSIS — Z87891 Personal history of nicotine dependence: Secondary | ICD-10-CM

## 2019-12-31 DIAGNOSIS — F909 Attention-deficit hyperactivity disorder, unspecified type: Secondary | ICD-10-CM | POA: Diagnosis present

## 2019-12-31 DIAGNOSIS — A419 Sepsis, unspecified organism: Secondary | ICD-10-CM | POA: Diagnosis present

## 2019-12-31 DIAGNOSIS — G35 Multiple sclerosis: Secondary | ICD-10-CM

## 2019-12-31 DIAGNOSIS — Z8249 Family history of ischemic heart disease and other diseases of the circulatory system: Secondary | ICD-10-CM

## 2019-12-31 DIAGNOSIS — N319 Neuromuscular dysfunction of bladder, unspecified: Secondary | ICD-10-CM | POA: Diagnosis present

## 2019-12-31 DIAGNOSIS — N390 Urinary tract infection, site not specified: Secondary | ICD-10-CM | POA: Diagnosis present

## 2019-12-31 DIAGNOSIS — Z79899 Other long term (current) drug therapy: Secondary | ICD-10-CM

## 2019-12-31 DIAGNOSIS — Z8572 Personal history of non-Hodgkin lymphomas: Secondary | ICD-10-CM

## 2019-12-31 DIAGNOSIS — I700 Atherosclerosis of aorta: Secondary | ICD-10-CM | POA: Diagnosis present

## 2019-12-31 DIAGNOSIS — N179 Acute kidney failure, unspecified: Secondary | ICD-10-CM | POA: Diagnosis present

## 2019-12-31 DIAGNOSIS — Z87440 Personal history of urinary (tract) infections: Secondary | ICD-10-CM

## 2019-12-31 DIAGNOSIS — F419 Anxiety disorder, unspecified: Secondary | ICD-10-CM | POA: Diagnosis present

## 2019-12-31 DIAGNOSIS — R509 Fever, unspecified: Secondary | ICD-10-CM

## 2019-12-31 DIAGNOSIS — Z825 Family history of asthma and other chronic lower respiratory diseases: Secondary | ICD-10-CM

## 2019-12-31 DIAGNOSIS — F329 Major depressive disorder, single episode, unspecified: Secondary | ICD-10-CM | POA: Diagnosis present

## 2019-12-31 DIAGNOSIS — D649 Anemia, unspecified: Secondary | ICD-10-CM | POA: Diagnosis not present

## 2019-12-31 DIAGNOSIS — Z818 Family history of other mental and behavioral disorders: Secondary | ICD-10-CM

## 2019-12-31 DIAGNOSIS — J449 Chronic obstructive pulmonary disease, unspecified: Secondary | ICD-10-CM

## 2019-12-31 DIAGNOSIS — I10 Essential (primary) hypertension: Secondary | ICD-10-CM | POA: Diagnosis present

## 2019-12-31 DIAGNOSIS — I739 Peripheral vascular disease, unspecified: Secondary | ICD-10-CM | POA: Diagnosis present

## 2019-12-31 DIAGNOSIS — T83511A Infection and inflammatory reaction due to indwelling urethral catheter, initial encounter: Principal | ICD-10-CM | POA: Diagnosis present

## 2019-12-31 DIAGNOSIS — E860 Dehydration: Secondary | ICD-10-CM | POA: Diagnosis present

## 2019-12-31 DIAGNOSIS — K5900 Constipation, unspecified: Secondary | ICD-10-CM

## 2019-12-31 DIAGNOSIS — M419 Scoliosis, unspecified: Secondary | ICD-10-CM | POA: Diagnosis present

## 2019-12-31 DIAGNOSIS — N183 Chronic kidney disease, stage 3 unspecified: Secondary | ICD-10-CM | POA: Diagnosis present

## 2019-12-31 DIAGNOSIS — N312 Flaccid neuropathic bladder, not elsewhere classified: Secondary | ICD-10-CM | POA: Diagnosis present

## 2019-12-31 DIAGNOSIS — Z20822 Contact with and (suspected) exposure to covid-19: Secondary | ICD-10-CM | POA: Diagnosis present

## 2019-12-31 DIAGNOSIS — E785 Hyperlipidemia, unspecified: Secondary | ICD-10-CM | POA: Diagnosis present

## 2019-12-31 DIAGNOSIS — Z6841 Body Mass Index (BMI) 40.0 and over, adult: Secondary | ICD-10-CM

## 2019-12-31 DIAGNOSIS — R531 Weakness: Secondary | ICD-10-CM

## 2019-12-31 DIAGNOSIS — N83202 Unspecified ovarian cyst, left side: Secondary | ICD-10-CM | POA: Diagnosis present

## 2019-12-31 LAB — CBC WITH DIFFERENTIAL/PLATELET
Abs Immature Granulocytes: 0.03 10*3/uL (ref 0.00–0.07)
Basophils Absolute: 0.1 10*3/uL (ref 0.0–0.1)
Basophils Relative: 1 %
Eosinophils Absolute: 0 10*3/uL (ref 0.0–0.5)
Eosinophils Relative: 0 %
HCT: 36.6 % (ref 36.0–46.0)
Hemoglobin: 12 g/dL (ref 12.0–15.0)
Immature Granulocytes: 0 %
Lymphocytes Relative: 5 %
Lymphs Abs: 0.5 10*3/uL — ABNORMAL LOW (ref 0.7–4.0)
MCH: 28.8 pg (ref 26.0–34.0)
MCHC: 32.8 g/dL (ref 30.0–36.0)
MCV: 87.8 fL (ref 80.0–100.0)
Monocytes Absolute: 0.9 10*3/uL (ref 0.1–1.0)
Monocytes Relative: 10 %
Neutro Abs: 8 10*3/uL — ABNORMAL HIGH (ref 1.7–7.7)
Neutrophils Relative %: 84 %
Platelets: 155 10*3/uL (ref 150–400)
RBC: 4.17 MIL/uL (ref 3.87–5.11)
RDW: 14.6 % (ref 11.5–15.5)
WBC: 9.6 10*3/uL (ref 4.0–10.5)
nRBC: 0 % (ref 0.0–0.2)

## 2019-12-31 LAB — URINALYSIS, COMPLETE (UACMP) WITH MICROSCOPIC
Bilirubin Urine: NEGATIVE
Glucose, UA: NEGATIVE mg/dL
Ketones, ur: NEGATIVE mg/dL
Nitrite: NEGATIVE
Protein, ur: 100 mg/dL — AB
Specific Gravity, Urine: 1.015 (ref 1.005–1.030)
WBC, UA: >50 WBC/hpf — ABNORMAL HIGH (ref 0–5)
pH: 5 (ref 5.0–8.0)

## 2019-12-31 LAB — COMPREHENSIVE METABOLIC PANEL
ALT: 14 U/L (ref 0–44)
AST: 16 U/L (ref 15–41)
Albumin: 4 g/dL (ref 3.5–5.0)
Alkaline Phosphatase: 70 U/L (ref 38–126)
Anion gap: 13 (ref 5–15)
BUN: 18 mg/dL (ref 8–23)
CO2: 22 mmol/L (ref 22–32)
Calcium: 8.9 mg/dL (ref 8.9–10.3)
Chloride: 98 mmol/L (ref 98–111)
Creatinine, Ser: 1.39 mg/dL — ABNORMAL HIGH (ref 0.44–1.00)
GFR calc Af Amer: 45 mL/min — ABNORMAL LOW (ref >60)
GFR calc non Af Amer: 39 mL/min — ABNORMAL LOW (ref >60)
Glucose, Bld: 118 mg/dL — ABNORMAL HIGH (ref 70–99)
Potassium: 3.8 mmol/L (ref 3.5–5.1)
Sodium: 133 mmol/L — ABNORMAL LOW (ref 135–145)
Total Bilirubin: 0.7 mg/dL (ref 0.3–1.2)
Total Protein: 7.8 g/dL (ref 6.5–8.1)

## 2019-12-31 LAB — LACTIC ACID, PLASMA: Lactic Acid, Venous: 1.3 mmol/L (ref 0.5–1.9)

## 2019-12-31 MED ORDER — SODIUM CHLORIDE 0.9 % IV BOLUS
500.0000 mL | Freq: Once | INTRAVENOUS | Status: AC
  Administered 2019-12-31: 500 mL via INTRAVENOUS

## 2019-12-31 MED ORDER — IOHEXOL 300 MG/ML  SOLN
100.0000 mL | Freq: Once | INTRAMUSCULAR | Status: AC | PRN
  Administered 2019-12-31: 100 mL via INTRAVENOUS

## 2019-12-31 MED ORDER — ACETAMINOPHEN 325 MG PO TABS
ORAL_TABLET | ORAL | Status: AC
  Administered 2019-12-31: 650 mg via ORAL
  Filled 2019-12-31: qty 2

## 2019-12-31 MED ORDER — ACETAMINOPHEN 325 MG PO TABS: 650.0000 mg | ORAL_TABLET | Freq: Once | ORAL | Status: AC | PRN

## 2019-12-31 NOTE — ED Notes (Signed)
1 set of cultures sent to lab

## 2019-12-31 NOTE — ED Notes (Signed)
Previous foley- 27fr w/ 10cc removed.

## 2019-12-31 NOTE — ED Notes (Signed)
Patient transported to CT 

## 2019-12-31 NOTE — ED Provider Notes (Signed)
[facility] Emergency Department Provider Note  ____________________________________________  Time seen: Approximately 9:50 PM  I have reviewed the triage vital signs and the nursing notes.   HISTORY  Chief Complaint Fever and Urinary Tract Infection    HPI [name] is a 69 y.o. female with a history of COPD, lymphoma, multiple sclerosis who comes to the ED complaining of fever since last night with cloudy urine in her Foley catheter collection bag.  Label on the Foley bag size it was inserted December 03, 2019.   Patient also complains of constipation and inability to have a bowel movement for the past 2 weeks.  She has decreased appetite and decreased oral intake.  Denies vomiting.  No chest pain shortness of breath or cough.     Past Medical History:  Diagnosis Date  . Abdominal aortic atherosclerosis ([place]) 11/11/2016  . ADHD   . Anxiety   . COPD (chronic obstructive pulmonary disease) ([place])   . Depression    major depressive  . Dyspnea    doe  . Edema    left leg  . Follicular lymphoma ([place])    B Cell  . Follicular lymphoma grade II ([place])   . Hypertension   . Hypotension    idiopathic  . Kyphoscoliosis and scoliosis 11/26/2011  . Morbid obesity ([place]) 01/05/2016  . Multiple sclerosis ([place])   . Multiple sclerosis ([place])    1980's  . Neuromuscular disorder ([place])   . Obstructive and reflux uropathy    foley  . Pain    atypical facial  . Peripheral vascular disease of lower extremity with ulceration ([place]) 11/08/2015  . Skin ulcer ([place]) 11/08/2015  . Weakness    generalized. has MS     Patient Active Problem List   Diagnosis Date Noted  . Chronic indwelling Foley catheter 11/19/2019  . Neuropathy   . Hypokalemia   . Hyperlipidemia   . Sepsis secondary to UTI ([place]) 10/17/2019  . Weakness   . AKI (acute kidney injury) ([place]) 04/20/2019  . Pressure injury of skin 02/03/2019  . Bilateral lower leg cellulitis 02/02/2019  . Ovarian mass,  left 01/27/2019  . Sepsis ([place]) 01/05/2019  . UTI (urinary tract infection) 06/13/2018  . Altered mental status 06/11/2018  . Fall 05/13/2018  . Depression 05/13/2018  . Recurrent cellulitis of lower extremity 06/25/2017  . Medication monitoring encounter 06/05/2017  . Cellulitis of left lower leg 04/25/2017  . Adjustment disorder with mixed disturbance of emotions and conduct 04/23/2017  . Foot pain, bilateral 02/03/2017  . Tinea pedis 02/03/2017  . Left ovarian cyst 12/09/2016  . Abdominal aortic atherosclerosis ([place]) 11/11/2016  . Swelling of lower extremity 10/11/2016  . Obstructive sleep apnea 10/11/2016  . Follicular lymphoma of intra-abdominal lymph nodes ([place]) 08/06/2016  . Multiple falls 06/07/2016  . Inguinal adenopathy 05/29/2016  . Obesity, morbid ([place]) 01/05/2016  . Low HDL (under 40) 12/19/2015  . Cellulitis 12/18/2015  . Skin ulcer ([place]) 11/08/2015  . Peripheral vascular disease of lower extremity with ulceration ([place]) 11/08/2015  . CKD (chronic kidney disease) stage 3, GFR 30-59 ml/min 11/08/2015  . Constipation due to pain medication 01/31/2015  . Obstructive sleep apnea of adult 01/13/2015  . Pelvic muscle wasting 01/13/2015  . Incomplete bladder emptying 01/13/2015  . Headache, migraine 10/24/2014  . Neurogenic bladder 10/24/2014  . Current tobacco use 10/24/2014  . Lumbar radiculopathy, chronic 10/02/2013  . COPD with bronchial hyperresponsiveness ([place]) 10/02/2013  . Major depressive disorder, recurrent, in partial remission (  [place]) 10/02/2013  . Essential hypertension 10/02/2013  . Multiple sclerosis ([place]) 10/02/2013  . Absence of bladder continence 09/25/2012  . Acontractile bladder 02/12/2012  . Narrowing of intervertebral disc space 11/26/2011  . Kyphoscoliosis and scoliosis 11/26/2011     Past Surgical History:  Procedure Laterality Date  . BACK SURGERY N/A 2002  . CYST EXCISION     lower back  . INGUINAL LYMPH NODE BIOPSY Left 07/04/2016    Procedure: INGUINAL LYMPH NODE BIOPSY;  Surgeon: Christene Lye, MD;  Location: [facility] ORS;  Service: General;  Laterality: Left;  . PORTACATH PLACEMENT N/A 07/22/2016   Procedure: INSERTION PORT-A-CATH;  Surgeon: Christene Lye, MD;  Location: [facility] ORS;  Service: General;  Laterality: N/A;  . TONSILLECTOMY AND ADENOIDECTOMY    . TUBAL LIGATION       Prior to Admission medications   Medication Sig Start Date End Date Taking? Authorizing Provider  amLODipine (NORVASC) 5 MG tablet Take 5 mg by mouth daily. 09/17/19   [provider]  atorvastatin (LIPITOR) 10 MG tablet Take 10 mg by mouth daily.  01/14/19   [provider]  baclofen (LIORESAL) 10 MG tablet Take 10 mg by mouth 3 (three) times daily.  04/10/19   [provider]  buPROPion (WELLBUTRIN XL) 300 MG 24 hr tablet Take 300 mg by mouth daily.  01/21/19   [provider]  clonazePAM (KLONOPIN) 1 MG tablet Take 0.5 tablets (0.5 mg total) by mouth at bedtime. Patient taking differently: Take 1 mg by mouth 2 (two) times daily as needed.  04/22/19   Lavina Hamman, MD  docusate sodium (COLACE) 100 MG capsule Take 1 capsule (100 mg total) by mouth 2 (two) times daily as needed. 11/24/19   Enzo Bi, MD  DULoxetine (CYMBALTA) 60 MG capsule Take 1 capsule (60 mg total) by mouth every morning. 10/14/16   Vaughan Basta, MD  famotidine (PEPCID) 20 MG tablet Take 1 tablet (20 mg total) by mouth daily. 10/23/19   Loletha Grayer, MD  gabapentin (NEURONTIN) 600 MG tablet Take 1 tablet (600 mg total) by mouth 3 (three) times daily. 02/09/17   Bettey Costa, MD  hydrALAZINE (APRESOLINE) 50 MG tablet Take 50 mg by mouth 3 (three) times daily. 06/25/19   [provider]  lisinopril (ZESTRIL) 20 MG tablet Take 20 mg by mouth daily. 09/17/19   [provider]  magnesium oxide (MAG-OX) 400 (241.3 Mg) MG tablet Take 1 tablet (400 mg total) by mouth daily. 06/22/19   Nolberto Hanlon, MD  MYRBETRIQ 50  MG TB24 tablet TAKE ONE TABLET BY MOUTH ONCE DAILY Patient taking differently: Take 50 mg by mouth daily.  03/08/19   Stoioff, Ronda Fairly, MD  QUEtiapine Fumarate (SEROQUEL XR) 150 MG 24 hr tablet Take 150 mg by mouth at bedtime.     [provider]     Allergies Patient has no known allergies.   Family History  Problem Relation Age of Onset  . COPD Mother   . Diabetes Mother   . Heart failure Mother   . Alcohol abuse Father   . Kidney disease Father   . Kidney failure Father   . Arthritis Sister   . CAD Maternal Grandmother   . Stroke Maternal Grandfather   . Arthritis Sister   . Mental illness Sister   . Arthritis Brother     Social History Social History   Tobacco Use  . Smoking status: Former Smoker    Packs/day: 1.00  Years: 20.00    Pack years: 20.00    Types: Cigarettes    Start date: 04/30/1995    Quit date: 02/03/2016    Years since quitting: 3.9  . Smokeless tobacco: Never Used  Vaping Use  . Vaping Use: Some days  Substance Use Topics  . Alcohol use: No    Alcohol/week: 0.0 standard drinks  . Drug use: Yes    Types: Marijuana    Comment: smokes THC occasionally per pt     Review of Systems  Constitutional:   Positive fever ENT:   No sore throat. No rhinorrhea. Cardiovascular:   No chest pain or syncope. Respiratory:   No dyspnea or cough. Gastrointestinal:   Negative for abdominal pain,  positive constipation.  Musculoskeletal:   Negative for focal pain or swelling All other systems reviewed and are negative except as documented above in ROS and HPI.  ____________________________________________   PHYSICAL EXAM:  VITAL SIGNS: ED Triage Vitals  Enc Vitals Group     BP 12/31/19 1508 (!) 153/114     Pulse Rate 12/31/19 1508 93     Resp 12/31/19 1508 19     Temp 12/31/19 1508 (!) 101.3 F (38.5 C)     Temp Source 12/31/19 1508 Oral     SpO2 12/31/19 1508 93 %     Weight 12/31/19 1510 270 lb (122.5 kg)     Height 12/31/19 1510 5\' 4"   (1.626 m)     Head Circumference --      Peak Flow --      Pain Score 12/31/19 1510 5     Pain Loc --      Pain Edu? --      Excl. in [place]? --     Vital signs reviewed, nursing assessments reviewed.   Constitutional:   Alert and oriented. Non-toxic appearance.  Morbidly obese Eyes:   Conjunctivae are normal. EOMI. PERRL. ENT      Head:   Normocephalic and atraumatic.      Nose:   Wearing a mask.      Mouth/Throat:   Wearing a mask.      Neck:   No meningismus. Full ROM. Hematological/Lymphatic/Immunilogical:   No cervical lymphadenopathy. Cardiovascular:   RRR. Symmetric bilateral radial and DP pulses.  No murmurs. Cap refill less than 2 seconds. Respiratory:   Normal respiratory effort without tachypnea/retractions. Breath sounds are clear and equal bilaterally. No wheezes/rales/rhonchi. Gastrointestinal:   Soft and nontender.  Mildly distended. There is no CVA tenderness.  No rebound, rigidity, or guarding. Genitourinary:   Foley catheter in place, tubing heavily colonized.  Musculoskeletal:   Normal range of motion in all extremities. No joint effusions.  No lower extremity tenderness.  Symmetric bilateral lower leg lymphedema. Neurologic:   Normal speech and language.  Motor grossly intact. No acute focal neurologic deficits are appreciated.  Skin:    Skin is warm, dry and intact. No rash noted.  No petechiae, purpura, or bullae.  ____________________________________________    LABS (pertinent positives/negatives) (all labs ordered are listed, but only abnormal results are displayed) Labs Reviewed  COMPREHENSIVE METABOLIC PANEL - Abnormal; Notable for the following components:      Result Value   Sodium 133 (*)    Glucose, Bld 118 (*)    Creatinine, Ser 1.39 (*)    GFR calc non Af Amer 39 (*)    GFR calc Af Amer 45 (*)    All other components within normal limits  CBC WITH DIFFERENTIAL/PLATELET -  Abnormal; Notable for the following components:   Neutro Abs 8.0 (*)     Lymphs Abs 0.5 (*)    All other components within normal limits  SARS CORONAVIRUS 2 BY RT PCR (HOSPITAL ORDER, [place] [facility])  LACTIC ACID, PLASMA  URINALYSIS, COMPLETE (UACMP) WITH MICROSCOPIC   ____________________________________________   EKG    ____________________________________________    RADIOLOGY  No results found.  ____________________________________________   PROCEDURES Procedures  ____________________________________________  DIFFERENTIAL DIAGNOSIS   UTI, ileus, bowel obstruction, constipation  CLINICAL IMPRESSION / ASSESSMENT AND PLAN / ED COURSE  Medications ordered in the ED: Medications  iohexol (OMNIPAQUE) 300 MG/ML solution 100 mL (has no administration in time range)  acetaminophen (TYLENOL) tablet 650 mg (650 mg Oral Given 12/31/19 1509)  sodium chloride 0.9 % bolus 500 mL (500 mLs Intravenous New Bag/Given 12/31/19 2220)    Pertinent labs & imaging results that were available during my care of the patient were reviewed by me and considered in my medical decision making (see chart for details).  [name] was evaluated in Emergency Department on 12/31/2019 for the symptoms described in the history of present illness. She was evaluated in the context of the global COVID-19 pandemic, which necessitated consideration that the patient might be at risk for infection with the SARS-CoV-2 virus that causes COVID-19. Institutional protocols and algorithms that pertain to the evaluation of patients at risk for COVID-19 are in a state of rapid change based on information released by regulatory bodies including the CDC and federal and state organizations. These policies and algorithms were followed during the patient's care in the ED.   Patient presents with fever.  Will replace Foley and obtain urine specimen from clean catheter.  Will obtain CT scan to evaluate for bowel obstruction or ileus related to MS.  We will do Covid testing.       ____________________________________________   FINAL CLINICAL IMPRESSION(S) / ED DIAGNOSES    Final diagnoses:  Fever, unspecified fever cause  Constipation, unspecified constipation type  Multiple sclerosis [facility])     ED Discharge Orders    None      Portions of this note were generated with dragon dictation software. Dictation errors may occur despite best attempts at proofreading.   Carrie Mew, MD 12/31/19 2311

## 2019-12-31 NOTE — ED Triage Notes (Signed)
PT to ED via EMS from home c/o UTI. States urine has been cloudy and she has been running a fever. PT alert and oriented. Has chronic foley cath in place.

## 2020-01-01 ENCOUNTER — Encounter: Payer: Self-pay | Admitting: Family Medicine

## 2020-01-01 DIAGNOSIS — K5900 Constipation, unspecified: Secondary | ICD-10-CM | POA: Diagnosis present

## 2020-01-01 DIAGNOSIS — E860 Dehydration: Secondary | ICD-10-CM | POA: Diagnosis present

## 2020-01-01 DIAGNOSIS — I700 Atherosclerosis of aorta: Secondary | ICD-10-CM | POA: Diagnosis present

## 2020-01-01 DIAGNOSIS — N319 Neuromuscular dysfunction of bladder, unspecified: Secondary | ICD-10-CM | POA: Diagnosis present

## 2020-01-01 DIAGNOSIS — I10 Essential (primary) hypertension: Secondary | ICD-10-CM | POA: Diagnosis present

## 2020-01-01 DIAGNOSIS — T83511A Infection and inflammatory reaction due to indwelling urethral catheter, initial encounter: Secondary | ICD-10-CM | POA: Diagnosis present

## 2020-01-01 DIAGNOSIS — F419 Anxiety disorder, unspecified: Secondary | ICD-10-CM | POA: Diagnosis present

## 2020-01-01 DIAGNOSIS — N179 Acute kidney failure, unspecified: Secondary | ICD-10-CM | POA: Diagnosis present

## 2020-01-01 DIAGNOSIS — F909 Attention-deficit hyperactivity disorder, unspecified type: Secondary | ICD-10-CM | POA: Diagnosis present

## 2020-01-01 DIAGNOSIS — A419 Sepsis, unspecified organism: Secondary | ICD-10-CM | POA: Diagnosis present

## 2020-01-01 DIAGNOSIS — J449 Chronic obstructive pulmonary disease, unspecified: Secondary | ICD-10-CM | POA: Diagnosis present

## 2020-01-01 DIAGNOSIS — G35 Multiple sclerosis: Secondary | ICD-10-CM | POA: Diagnosis present

## 2020-01-01 DIAGNOSIS — E876 Hypokalemia: Secondary | ICD-10-CM | POA: Diagnosis not present

## 2020-01-01 DIAGNOSIS — N83202 Unspecified ovarian cyst, left side: Secondary | ICD-10-CM | POA: Diagnosis present

## 2020-01-01 DIAGNOSIS — N390 Urinary tract infection, site not specified: Secondary | ICD-10-CM

## 2020-01-01 DIAGNOSIS — E785 Hyperlipidemia, unspecified: Secondary | ICD-10-CM | POA: Diagnosis present

## 2020-01-01 DIAGNOSIS — I739 Peripheral vascular disease, unspecified: Secondary | ICD-10-CM | POA: Diagnosis present

## 2020-01-01 DIAGNOSIS — M419 Scoliosis, unspecified: Secondary | ICD-10-CM | POA: Diagnosis present

## 2020-01-01 DIAGNOSIS — J439 Emphysema, unspecified: Secondary | ICD-10-CM

## 2020-01-01 DIAGNOSIS — Z79899 Other long term (current) drug therapy: Secondary | ICD-10-CM | POA: Diagnosis not present

## 2020-01-01 DIAGNOSIS — D649 Anemia, unspecified: Secondary | ICD-10-CM | POA: Diagnosis not present

## 2020-01-01 DIAGNOSIS — R509 Fever, unspecified: Secondary | ICD-10-CM

## 2020-01-01 DIAGNOSIS — R531 Weakness: Secondary | ICD-10-CM

## 2020-01-01 DIAGNOSIS — Z6841 Body Mass Index (BMI) 40.0 and over, adult: Secondary | ICD-10-CM | POA: Diagnosis not present

## 2020-01-01 DIAGNOSIS — F329 Major depressive disorder, single episode, unspecified: Secondary | ICD-10-CM | POA: Diagnosis present

## 2020-01-01 DIAGNOSIS — Z20822 Contact with and (suspected) exposure to covid-19: Secondary | ICD-10-CM | POA: Diagnosis present

## 2020-01-01 LAB — CREATININE, SERUM
Creatinine, Ser: 1.74 mg/dL — ABNORMAL HIGH (ref 0.44–1.00)
GFR calc Af Amer: 34 mL/min — ABNORMAL LOW (ref >60)
GFR calc non Af Amer: 30 mL/min — ABNORMAL LOW (ref >60)

## 2020-01-01 LAB — CBC
HCT: 36.7 % (ref 36.0–46.0)
Hemoglobin: 12 g/dL (ref 12.0–15.0)
MCH: 28.9 pg (ref 26.0–34.0)
MCHC: 32.7 g/dL (ref 30.0–36.0)
MCV: 88.4 fL (ref 80.0–100.0)
Platelets: 170 10*3/uL (ref 150–400)
RBC: 4.15 MIL/uL (ref 3.87–5.11)
RDW: 14.7 % (ref 11.5–15.5)
WBC: 14.1 10*3/uL — ABNORMAL HIGH (ref 4.0–10.5)
nRBC: 0 % (ref 0.0–0.2)

## 2020-01-01 LAB — SARS CORONAVIRUS 2 BY RT PCR (HOSPITAL ORDER, PERFORMED IN ~~LOC~~ HOSPITAL LAB): SARS Coronavirus 2: NEGATIVE

## 2020-01-01 MED ORDER — ACETAMINOPHEN 500 MG PO TABS
ORAL_TABLET | ORAL | Status: AC
  Administered 2020-01-01: 1000 mg via ORAL
  Filled 2020-01-01: qty 2

## 2020-01-01 MED ORDER — MIRABEGRON ER 50 MG PO TB24
50.0000 mg | ORAL_TABLET | Freq: Every day | ORAL | Status: DC
  Administered 2020-01-01 – 2020-01-06 (×6): 50 mg via ORAL
  Filled 2020-01-01 (×6): qty 1

## 2020-01-01 MED ORDER — QUETIAPINE FUMARATE ER 50 MG PO TB24
150.0000 mg | ORAL_TABLET | Freq: Every day | ORAL | Status: DC
  Administered 2020-01-02 – 2020-01-05 (×5): 150 mg via ORAL
  Filled 2020-01-01 (×6): qty 3

## 2020-01-01 MED ORDER — HYDRALAZINE HCL 50 MG PO TABS
50.0000 mg | ORAL_TABLET | Freq: Three times a day (TID) | ORAL | Status: DC
  Administered 2020-01-01 – 2020-01-02 (×5): 50 mg via ORAL
  Filled 2020-01-01 (×5): qty 1

## 2020-01-01 MED ORDER — PROMETHAZINE HCL 25 MG PO TABS
12.5000 mg | ORAL_TABLET | Freq: Four times a day (QID) | ORAL | Status: DC | PRN
  Filled 2020-01-01: qty 1

## 2020-01-01 MED ORDER — VORTIOXETINE HBR 5 MG PO TABS
5.0000 mg | ORAL_TABLET | Freq: Every day | ORAL | Status: DC
  Administered 2020-01-01 – 2020-01-06 (×6): 5 mg via ORAL
  Filled 2020-01-01 (×6): qty 1

## 2020-01-01 MED ORDER — SODIUM CHLORIDE 0.9 % IV SOLN
1.0000 g | INTRAVENOUS | Status: DC
  Filled 2020-01-01: qty 10

## 2020-01-01 MED ORDER — IPRATROPIUM-ALBUTEROL 0.5-2.5 (3) MG/3ML IN SOLN
3.0000 mL | Freq: Four times a day (QID) | RESPIRATORY_TRACT | Status: DC | PRN
  Administered 2020-01-01 – 2020-01-02 (×2): 3 mL via RESPIRATORY_TRACT
  Filled 2020-01-01 (×2): qty 3

## 2020-01-01 MED ORDER — LACTATED RINGERS IV SOLN: INTRAVENOUS | Status: DC

## 2020-01-01 MED ORDER — LISINOPRIL 20 MG PO TABS
20.0000 mg | ORAL_TABLET | Freq: Every day | ORAL | Status: DC
  Administered 2020-01-01 – 2020-01-02 (×2): 20 mg via ORAL
  Filled 2020-01-01 (×2): qty 1

## 2020-01-01 MED ORDER — AMLODIPINE BESYLATE 5 MG PO TABS
5.0000 mg | ORAL_TABLET | Freq: Every day | ORAL | Status: DC
  Administered 2020-01-01 – 2020-01-02 (×2): 5 mg via ORAL
  Filled 2020-01-01 (×2): qty 1

## 2020-01-01 MED ORDER — DULOXETINE HCL 60 MG PO CPEP
60.0000 mg | ORAL_CAPSULE | ORAL | Status: DC
  Administered 2020-01-01 – 2020-01-06 (×6): 60 mg via ORAL
  Filled 2020-01-01 (×7): qty 1

## 2020-01-01 MED ORDER — ACETAMINOPHEN 500 MG PO TABS: 1000.0000 mg | ORAL_TABLET | Freq: Once | ORAL | Status: AC

## 2020-01-01 MED ORDER — ONDANSETRON HCL 4 MG/2ML IJ SOLN
4.0000 mg | Freq: Once | INTRAMUSCULAR | Status: AC
  Administered 2020-01-01: 4 mg via INTRAVENOUS

## 2020-01-01 MED ORDER — SENNOSIDES-DOCUSATE SODIUM 8.6-50 MG PO TABS
1.0000 | ORAL_TABLET | Freq: Every evening | ORAL | Status: DC | PRN
  Administered 2020-01-01: 1 via ORAL
  Filled 2020-01-01: qty 1

## 2020-01-01 MED ORDER — ONDANSETRON HCL 4 MG/2ML IJ SOLN
INTRAMUSCULAR | Status: AC
  Filled 2020-01-01: qty 2

## 2020-01-01 MED ORDER — ENOXAPARIN SODIUM 40 MG/0.4ML ~~LOC~~ SOLN
40.0000 mg | Freq: Two times a day (BID) | SUBCUTANEOUS | Status: DC
  Administered 2020-01-01 – 2020-01-06 (×11): 40 mg via SUBCUTANEOUS
  Filled 2020-01-01 (×11): qty 0.4

## 2020-01-01 MED ORDER — TERIFLUNOMIDE 14 MG PO TABS: 14.0000 mg | ORAL_TABLET | Freq: Every day | ORAL | Status: DC

## 2020-01-01 MED ORDER — PIPERACILLIN-TAZOBACTAM 3.375 G IVPB
3.3750 g | Freq: Three times a day (TID) | INTRAVENOUS | Status: DC
  Administered 2020-01-01 – 2020-01-02 (×3): 3.375 g via INTRAVENOUS
  Filled 2020-01-01 (×2): qty 50

## 2020-01-01 MED ORDER — VITAMIN B-12 1000 MCG PO TABS
1000.0000 ug | ORAL_TABLET | Freq: Every day | ORAL | Status: DC
  Administered 2020-01-01 – 2020-01-06 (×6): 1000 ug via ORAL
  Filled 2020-01-01 (×7): qty 1

## 2020-01-01 MED ORDER — SODIUM CHLORIDE 0.9 % IV SOLN
1.0000 g | Freq: Once | INTRAVENOUS | Status: AC
  Administered 2020-01-01: 1 g via INTRAVENOUS
  Filled 2020-01-01: qty 10

## 2020-01-01 MED ORDER — GABAPENTIN 300 MG PO CAPS
600.0000 mg | ORAL_CAPSULE | Freq: Three times a day (TID) | ORAL | Status: DC
  Administered 2020-01-01 – 2020-01-06 (×15): 600 mg via ORAL
  Filled 2020-01-01 (×2): qty 2
  Filled 2020-01-01: qty 6
  Filled 2020-01-01 (×13): qty 2

## 2020-01-01 MED ORDER — ACETAMINOPHEN 500 MG PO TABS
1000.0000 mg | ORAL_TABLET | Freq: Three times a day (TID) | ORAL | Status: DC | PRN
  Administered 2020-01-01 – 2020-01-05 (×5): 1000 mg via ORAL
  Filled 2020-01-01 (×6): qty 2

## 2020-01-01 MED ORDER — CHLORHEXIDINE GLUCONATE CLOTH 2 % EX PADS
6.0000 | MEDICATED_PAD | Freq: Every day | CUTANEOUS | Status: DC
  Administered 2020-01-01 – 2020-01-06 (×6): 6 via TOPICAL

## 2020-01-01 MED ORDER — CLONAZEPAM 0.5 MG PO TABS
0.5000 mg | ORAL_TABLET | Freq: Every day | ORAL | Status: DC
  Administered 2020-01-01 – 2020-01-05 (×5): 0.5 mg via ORAL
  Filled 2020-01-01 (×5): qty 1

## 2020-01-01 MED ORDER — CEPHALEXIN 500 MG PO CAPS: 500.0000 mg | ORAL_CAPSULE | Freq: Four times a day (QID) | ORAL | 0 refills | Status: DC

## 2020-01-01 MED ORDER — ATORVASTATIN CALCIUM 10 MG PO TABS
10.0000 mg | ORAL_TABLET | Freq: Every day | ORAL | Status: DC
  Administered 2020-01-01 – 2020-01-06 (×6): 10 mg via ORAL
  Filled 2020-01-01 (×6): qty 1

## 2020-01-01 NOTE — ED Notes (Signed)
ED Provider at bedside. 

## 2020-01-01 NOTE — Evaluation (Signed)
Physical Therapy Evaluation Patient Details Name: [name] MRN: [id number] DOB: 11/17/1950 Today's Date: 01/01/2020   History of Present Illness  [name] is a 69 y.o. female with medical history significant for COPD, hypertension, multiple sclerosis, depression/anxiety, neurogenic bladder, chronic indwelling Foley catheter, morbid obesity.  She presented 12/31/19 at ED for evaluation of fever. Patent was about to be taken home by EMS on 01/01/20 and was unable to get from bed to stretcher with 3 person assist and found to have fever. Patient stating she has not had a good experience in SNF in the past, prefer home health.    Clinical Impression  Patient alert and oriented but does appear to lack insight into the significance of her functional limitations. She provided a detailed history including that she lives with her husband who she reports has told her she will need to clean up poop that had dropped out of her as she walked and she feels that he does not understand her limitations or how much help she needs. She reports that prior to hospitalization she slept in her lift chair and ambulated to the bathroom which is at least 2 rooms away. She uses a BSC over the commode and takes a shower if clean up is too extensive. Reports she lives in a 1 story home with ramp to enter and uses RW or WC to get around the house. She has help with IADLs and some ADLs. Earlier today documentation states that 3 people could not assist her to the stretcher to go home with EMS, so PT was ordered for further evaluation. Upon evaluation, patient required min A to max A +2 for bed mobility and transfers and was unable to contribute at all to pericare - requiring extensive clean up for a BM that happened during mobility. She fatigued quickly and required extensive cuing for proper handling of AD and to calm her from anxiety. Patient appears to have experienced a decline in functional independence and strength and does not  appear to be able to go home due to safety concerns with increased need for assistance and decreased independence. Recommend short term rehab prior to returning home. Patient would benefit from skilled physical therapy to address remaining impairments and functional limitations to work towards stated goals and return to PLOF or maximal functional independence.       Follow Up Recommendations SNF    Equipment Recommendations  Rolling walker with 5" wheels (bariatric)    Recommendations for Other Services       Precautions / Restrictions Precautions Precautions: Fall Restrictions Weight Bearing Restrictions: No      Mobility  Bed Mobility Overal bed mobility: Needs Assistance Bed Mobility: Rolling;Supine to Sit;Sit to Supine Rolling: Mod assist   Supine to sit: Min assist Sit to supine: Max assist;+2 for physical assistance   General bed mobility comments: patient completes rolling with mod A to move lower half. supine to sit with min A HHA at R UE with patient pulling herself up. Sit to supine with max A + 2 (support at trunk and feet), max A +2 to scoot up in bed with draw sheet(patient pushing with feet).  Transfers Overall transfer level: Needs assistance Equipment used: Rolling walker (2 wheeled) Transfers: Sit to/from Stand Sit to Stand: Mod assist;Min assist;+2 safety/equipment;From elevated surface         General transfer comment: Patient transfered from thigh high bed with supervision to armed chair using RW. Transfered from chair to [facility] with mod A  to get up from chair - required multiple attempts and became very anxious at one point. Required cuing to calm herself, move feet and hands into proper position and shift forward adequately. Transfered BSC to bed with min A to get up and again needed step by step cuing for hand placement. Assistance from nursing during last transfer to assist with pericare and managing lines/safety as patient got tired. Stooped posture.  Patient having BM through first two transfers and gas last transfer.  Ambulation/Gait Ambulation/Gait assistance: Min assist Gait Distance (Feet): 3 Feet Assistive device: Rolling walker (2 wheeled) Gait Pattern/deviations: Shuffle;Trunk flexed     General Gait Details: patient ambulated ~ 3 feet from [facility] to side of bed with bariatric RW and min A from PT. Patient steps first with R LE and brings L LE up dragging on ground. Does not come to full upright position and leans forward.  Stairs            Wheelchair Mobility    Modified Rankin (Stroke Patients Only)       Balance Overall balance assessment: Needs assistance Sitting-balance support: Feet supported;Bilateral upper extremity supported Sitting balance-Leahy Scale: Fair Sitting balance - Comments: needs support at first for trunk stabilitly but progressed to steady edge of bed sitting.   Standing balance support: Bilateral upper extremity supported Standing balance-Leahy Scale: Poor Standing balance comment: patient dependent on RW for standing. Able to stand about 1 min at edge of bed while performing pericare.                             Pertinent Vitals/Pain Pain Assessment: 0-10 Pain Score: 5  Pain Location: headache Pain Intervention(s): Limited activity within patient's tolerance;Monitored during session;Repositioned    Home Living Family/patient expects to be discharged to:: Private residence Living Arrangements: Spouse/significant other Available Help at Discharge: Family;Available 24 hours/day Type of Home: House Home Access: Stairs to enter;Ramped entrance Entrance Stairs-Rails: None Entrance Stairs-Number of Steps: has a ramp, and then up two more steps (has a grab handle on the L) Home Layout: One level Home Equipment: Wheelchair - manual;Walker - 4 wheels;Grab bars - tub/shower;Other (comment);Shower seat;Bedside commode (lift chair) Additional Comments: pt reports 2 steps into house  after ramp to get on porch; BSC is over toilet.    Prior Function Level of Independence: Needs assistance   Gait / Transfers Assistance Needed: Patient uses wheelchair (self propelled) and rollator within home.  Patient sleeps in lift chair to help with transfers. Pt able to use rollator with grab bar to transition into shower to seat.  ADL's / Homemaking Assistance Needed: Husbands assists with all IADLs and some ADLs including bathing (drying off) and some dressing (bra and socks)  Comments: only able to complete home distances with 4WW or w/c Fearful of falling. Endorses 8 falls in past six month (less falls recently due to being more careful). .     Hand Dominance   Dominant Hand: Right    Extremity/Trunk Assessment   Upper Extremity Assessment Upper Extremity Assessment: Generalized weakness;LUE deficits/detail (deformity at left hand) LUE Deficits / Details: reports more affected by MS    Lower Extremity Assessment Lower Extremity Assessment: Generalized weakness;LLE deficits/detail LLE Deficits / Details: unable to dorsiflex, difficulty with hip flexion, more difficulty moving leg in bed. drags left LE on floor when taking steps.       Communication   Communication: No difficulties  Cognition Arousal/Alertness: Awake/alert Behavior During Therapy:  Anxious Overall Cognitive Status: No family/caregiver present to determine baseline cognitive functioning                                 General Comments: does not seem fully aware of her limitations. Seems to think she will be able to function okay at home despite needing extensive pericare and only able to take a few shaky steps.      General Comments General comments (skin integrity, edema, etc.): patient soiled and nursing assisted with extensive clean up including throughout saddle area and down leg    Exercises Other Exercises Other Exercises: praticed edge of bed sitting for trunk stability, sitting on  BSC, required max A for pericare +2 (nursing and PT). Educated pt on d/c reccomendations and role of PT in acute care setting. cuing for anxiety control   Assessment/Plan    PT Assessment Patient needs continued PT services  PT Problem List Decreased strength;Decreased range of motion;Decreased cognition;Decreased activity tolerance;Decreased knowledge of use of DME;Impaired tone;Decreased balance;Decreased safety awareness;Obesity;Decreased mobility;Decreased skin integrity       PT Treatment Interventions DME instruction;Balance training;Gait training;Stair training;Neuromuscular re-education;Cognitive remediation;Functional mobility training;Patient/family education;Therapeutic activities;Therapeutic exercise    PT Goals (Current goals can be found in the Care Plan section)  Acute Rehab PT Goals Patient Stated Goal: go home PT Goal Formulation: With patient Time For Goal Achievement: 01/15/20 Potential to Achieve Goals: Fair    Frequency Min 2X/week   Barriers to discharge Inaccessible home environment;Decreased caregiver support patient requires heavy care and physical assist at this point. she expressed repeated concerns about the inadequacy of assistance that her husband gives    Co-evaluation               AM-PAC PT "6 Clicks" Mobility  Outcome Measure Help needed turning from your back to your side while in a flat bed without using bedrails?: A Little Help needed moving from lying on your back to sitting on the side of a flat bed without using bedrails?: A Lot Help needed moving to and from a bed to a chair (including a wheelchair)?: A Lot Help needed standing up from a chair using your arms (e.g., wheelchair or bedside chair)?: A Lot Help needed to walk in hospital room?: A Lot Help needed climbing 3-5 steps with a railing? : Total 6 Click Score: 12    End of Session Equipment Utilized During Treatment: Gait belt Activity Tolerance: Patient limited by  fatigue Patient left: in bed;with call bell/phone within reach;with bed alarm set Nurse Communication: Mobility status PT Visit Diagnosis: Difficulty in walking, not elsewhere classified (R26.2);Muscle weakness (generalized) (M62.81)    Time: 8469-6295 PT Time Calculation (min) (ACUTE ONLY): 57 min   Charges:   PT Evaluation $PT Eval Moderate Complexity: 1 Mod PT Treatments $Therapeutic Activity: 23-37 mins        Everlean Alstrom. Graylon Good, PT, DPT 01/01/20, 1:46 PM

## 2020-01-01 NOTE — Progress Notes (Signed)
PHARMACIST - PHYSICIAN COMMUNICATION  CONCERNING:  Enoxaparin (Lovenox) for DVT Prophylaxis   RECOMMENDATION: Patient was prescribed enoxaprin 40mg  q24 hours for VTE prophylaxis.   Filed Weights   12/31/19 1510  Weight: 122.5 kg (270 lb)    Body mass index is 46.35 kg/m.  Estimated Creatinine Clearance: 50 mL/min (A) (by C-G formula based on SCr of 1.39 mg/dL (H)).  Based on [place] patient is candidate for enoxaparin 40mg  every 12 hours due to BMI being >40.   DESCRIPTION: Pharmacy has adjusted enoxaparin dose per [facility] policy.  Patient is now receiving enoxaparin _40___mg every _12__ hours    Ena Dawley, PharmD Clinical Pharmacist  01/01/2020 6:37 AM

## 2020-01-01 NOTE — H&P (Signed)
History and Physical    [name] LFY:[id number] DOB: 1951/02/10 DOA: 12/31/2019  PCP: Kirk Ruths, MD   Patient coming from: Home  Chief Complaint: Fever, cloudy urine  HPI: [name] is a 69 y.o. female with medical history significant for COPD, hypertension, multiple sclerosis, depression/anxiety, neurogenic bladder, chronic indwelling Foley catheter, morbid obesity.  She presents for evaluation of fever that began yesterday afternoon.  She reports she had a temperature to 102 degrees at home and she will take Tylenol for it.  She reports a sudden cloudy urine in her Foley catheter bag.  She wears a chronic Foley catheter secondary to neurogenic bladder.  She reports she has not had any nausea or vomiting.  She has not had any diarrhea.  She is not able to tell if she has any dysuria or urinary frequency secondary to the chronic Foley catheter.  He has history of having UTIs in the past.  She reports she has had generalized weakness since yesterday afternoon and she normally ambulates with a walker at home but she was too weak to do that. She states she has not had any chest pain or palpitations.  She denies any syncope. Patient denies tobacco and alcohol use.  She states that she does smoke marijuana every 3 to 4 days.  ED Course: In the emergency room patient is found to have a UTI on urinalysis.  She was given dose of Rocephin.  Patient states she wanted to go home and be treated as an outpatient.  Her lactic acid level was normal at 1.3 and she was hemodynamically stable.  They attempted to ambulate her in the emergency room and she was not able to stand up and ambulate even with help of 3 nurses therefore was not safe to discharge her home and hospital service was asked to evaluate and admit patient for further management  Review of Systems:  General: Reports fever and chills.  Reports generalized weakness and difficulty ambulating.  Denies weight loss, night sweats.  Denies  dizziness.  Denies change in appetite HENT: Denies head trauma, headache, denies change in hearing, tinnitus.  Denies nasal congestion or bleeding.  Denies sore throat, sores in mouth.  Denies difficulty swallowing Eyes: Denies blurry vision, pain in eye, drainage.  Denies discoloration of eyes. Neck: Denies pain.  Denies swelling.  Denies pain with movement. Cardiovascular: Denies chest pain, palpitations.  Denies edema.  Denies orthopnea Respiratory: Denies shortness of breath, cough.  Denies wheezing.  Denies sputum production Gastrointestinal: Denies abdominal pain, swelling.  Denies nausea, vomiting, diarrhea.  Denies melena.  Denies hematemesis. Musculoskeletal: Denies limitation of movement.  Denies deformity or swelling.  Denies pain.  Denies arthralgias or myalgias. Genitourinary: Denies pelvic pain.  Denies urinary frequency or hesitancy.  Denies dysuria.  Has chronic indwelling Foley catheter with cloudy urine since yesterday Skin: Denies rash.  Denies petechiae, purpura, ecchymosis. Neurological: Denies headache.  Denies syncope.  Denies seizure activity.  Denies weakness or paresthesia.  Denies slurred speech, drooping face.  Denies visual change. Psychiatric: Denies depression, anxiety.  Denies suicidal thoughts or ideation.  Denies hallucinations.  Past Medical History:  Diagnosis Date  . Abdominal aortic atherosclerosis ([place]) 11/11/2016  . ADHD   . Anxiety   . COPD (chronic obstructive pulmonary disease) ([place])   . Depression    major depressive  . Dyspnea    doe  . Edema    left leg  . Follicular lymphoma ([place])    B Cell  .  Follicular lymphoma grade II ([place])   . Hypertension   . Hypotension    idiopathic  . Kyphoscoliosis and scoliosis 11/26/2011  . Morbid obesity ([place]) 01/05/2016  . Multiple sclerosis ([place])   . Multiple sclerosis ([place])    1980's  . Neuromuscular disorder ([place])   . Obstructive and reflux uropathy    foley  . Pain    atypical facial  . Peripheral  vascular disease of lower extremity with ulceration ([place]) 11/08/2015  . Skin ulcer ([place]) 11/08/2015  . Weakness    generalized. has MS    Past Surgical History:  Procedure Laterality Date  . BACK SURGERY N/A 2002  . CYST EXCISION     lower back  . INGUINAL LYMPH NODE BIOPSY Left 07/04/2016   Procedure: INGUINAL LYMPH NODE BIOPSY;  Surgeon: Christene Lye, MD;  Location: [facility] ORS;  Service: General;  Laterality: Left;  . PORTACATH PLACEMENT N/A 07/22/2016   Procedure: INSERTION PORT-A-CATH;  Surgeon: Christene Lye, MD;  Location: [facility] ORS;  Service: General;  Laterality: N/A;  . TONSILLECTOMY AND ADENOIDECTOMY    . TUBAL LIGATION      Social History  reports that she quit smoking about 3 years ago. Her smoking use included cigarettes. She started smoking about 24 years ago. She has a 20.00 pack-year smoking history. She has never used smokeless tobacco. She reports current drug use. Drug: Marijuana. She reports that she does not drink alcohol.  No Known Allergies  Family History  Problem Relation Age of Onset  . COPD Mother   . Diabetes Mother   . Heart failure Mother   . Alcohol abuse Father   . Kidney disease Father   . Kidney failure Father   . Arthritis Sister   . CAD Maternal Grandmother   . Stroke Maternal Grandfather   . Arthritis Sister   . Mental illness Sister   . Arthritis Brother      Prior to Admission medications   Medication Sig Start Date End Date Taking? Authorizing Provider  acetaminophen (TYLENOL) 500 MG tablet Take 1,000 mg by mouth every 8 (eight) hours as needed for moderate pain.   Yes [provider]  amLODipine (NORVASC) 5 MG tablet Take 5 mg by mouth daily. 09/17/19  Yes [provider]  atorvastatin (LIPITOR) 10 MG tablet Take 10 mg by mouth daily.  01/14/19  Yes [provider]  baclofen (LIORESAL) 10 MG tablet Take 10 mg by mouth 3 (three) times daily.  04/10/19  Yes [provider]  buPROPion  (WELLBUTRIN XL) 300 MG 24 hr tablet Take 300 mg by mouth daily.  01/21/19  Yes [provider]  clonazePAM (KLONOPIN) 1 MG tablet Take 0.5 tablets (0.5 mg total) by mouth at bedtime. Patient taking differently: Take 1 mg by mouth 2 (two) times daily as needed.  04/22/19  Yes Lavina Hamman, MD  cyanocobalamin 1000 MCG tablet Take 1,000 mcg by mouth daily.   Yes [provider]  docusate sodium (COLACE) 100 MG capsule Take 1 capsule (100 mg total) by mouth 2 (two) times daily as needed. 11/24/19  Yes Enzo Bi, MD  DULoxetine (CYMBALTA) 60 MG capsule Take 1 capsule (60 mg total) by mouth every morning. 10/14/16  Yes Vaughan Basta, MD  famotidine (PEPCID) 20 MG tablet Take 1 tablet (20 mg total) by mouth daily. 10/23/19  Yes Wieting, Richard, MD  gabapentin (NEURONTIN) 600 MG tablet Take 1 tablet (600 mg total) by mouth 3 (three) times daily. 02/09/17  Yes Bettey Costa, MD  hydrALAZINE (APRESOLINE) 50 MG tablet Take 50 mg by mouth 3 (three) times daily. 06/25/19  Yes [provider]  lisinopril (ZESTRIL) 20 MG tablet Take 20 mg by mouth daily. 09/17/19  Yes [provider]  Multiple Vitamin (MULTIVITAMIN WITH MINERALS) TABS tablet Take 1 tablet by mouth daily.   Yes [provider]  MYRBETRIQ 50 MG TB24 tablet TAKE ONE TABLET BY MOUTH ONCE DAILY Patient taking differently: Take 50 mg by mouth daily.  03/08/19  Yes Stoioff, Ronda Fairly, MD  polyethylene glycol powder (GLYCOLAX/MIRALAX) 17 GM/SCOOP powder Take 17 g by mouth daily as needed for mild constipation. 05/01/19  Yes [provider]  QUEtiapine Fumarate (SEROQUEL XR) 150 MG 24 hr tablet Take 150 mg by mouth at bedtime.    Yes [provider]  Teriflunomide 14 MG TABS Take 14 mg by mouth daily. 12/03/19 03/02/20 Yes [provider]  TRINTELLIX 5 MG TABS tablet Take 5 mg by mouth daily. 12/15/19  Yes [provider]  cephALEXin (KEFLEX) 500 MG capsule Take 1 capsule (500 mg  total) by mouth 4 (four) times daily for 10 days. 01/01/20 01/11/20  Nance Pear, MD  magnesium oxide (MAG-OX) 400 (241.3 Mg) MG tablet Take 1 tablet (400 mg total) by mouth daily. 06/22/19   Nolberto Hanlon, MD    Physical Exam: Vitals:   01/01/20 0130 01/01/20 0200 01/01/20 0309 01/01/20 0526  BP: 140/64 (!) 157/101 (!) 126/109   Pulse: 95 85 91   Resp:  17 13   Temp:  99.3 F (37.4 C) (!) 102.5 F (39.2 C) 98.5 F (36.9 C)  TempSrc:    Oral  SpO2: 95% 98% 95%   Weight:      Height:        Constitutional: NAD, calm, comfortable Vitals:   01/01/20 0130 01/01/20 0200 01/01/20 0309 01/01/20 0526  BP: 140/64 (!) 157/101 (!) 126/109   Pulse: 95 85 91   Resp:  17 13   Temp:  99.3 F (37.4 C) (!) 102.5 F (39.2 C) 98.5 F (36.9 C)  TempSrc:    Oral  SpO2: 95% 98% 95%   Weight:      Height:       General: WDWN, Alert and oriented x3.  Eyes: EOMI, PERRL, lids and conjunctivae normal.  Sclera nonicteric HENT:  /AT, external ears normal.  Nares patent without epistasis.  Mucous membranes are moist. Posterior pharynx clear of any exudate or lesions.  Poor dentition with multiple missing teeth.  Neck: Soft, normal range of motion, supple, no masses, no thyromegaly.  Trachea midline Respiratory: Equal breath sounds that are diminished.  Diffuse Rales.  Mild expiratory wheezing no crackles. Normal respiratory effort. No accessory muscle use.  Cardiovascular: Regular rate and rhythm, no murmurs / rubs / gallops.  Mild lower extremity edema. 1+ pedal pulses.  Abdomen: Soft, no tenderness, nondistended, no rebound or guarding.  Morbid obesity.  No masses palpated. Bowel sounds normoactive Musculoskeletal: FROM. no clubbing / cyanosis. No joint deformity upper and lower extremities. no contractures. Normal muscle tone.  Skin: Warm, dry, intact no rashes, lesions, ulcers. No induration Neurologic: CN 2-12 grossly intact.  Normal speech.  Sensation intact, patella DTR +1 bilaterally.  Strength 3/5 in all extremities.  Patient unable to ambulate due to weakness Psychiatric: Normal judgment and insight.  Normal mood.    Labs on Admission: I have personally reviewed following labs and imaging studies  CBC: Recent Labs  Lab 12/31/19 1524  WBC 9.6  NEUTROABS 8.0*  HGB 12.0  HCT 36.6  MCV 87.8  PLT 673    Basic Metabolic Panel: Recent Labs  Lab 12/31/19 1524  NA 133*  K 3.8  CL 98  CO2 22  GLUCOSE 118*  BUN 18  CREATININE 1.39*  CALCIUM 8.9    GFR: Estimated Creatinine Clearance: 50 mL/min (A) (by C-G formula based on SCr of 1.39 mg/dL (H)).  Liver Function Tests: Recent Labs  Lab 12/31/19 1524  AST 16  ALT 14  ALKPHOS 70  BILITOT 0.7  PROT 7.8  ALBUMIN 4.0    Urine analysis:    Component Value Date/Time   COLORURINE YELLOW (A) 12/31/2019 2218   APPEARANCEUR TURBID (A) 12/31/2019 2218   APPEARANCEUR Hazy 08/02/2013 0020   LABSPEC 1.015 12/31/2019 2218   LABSPEC 1.012 08/02/2013 0020   PHURINE 5.0 12/31/2019 2218   GLUCOSEU NEGATIVE 12/31/2019 2218   GLUCOSEU Negative 08/02/2013 0020   HGBUR MODERATE (A) 12/31/2019 2218   BILIRUBINUR NEGATIVE 12/31/2019 2218   BILIRUBINUR Negative 08/02/2013 0020   KETONESUR NEGATIVE 12/31/2019 2218   PROTEINUR 100 (A) 12/31/2019 2218   NITRITE NEGATIVE 12/31/2019 2218   LEUKOCYTESUR LARGE (A) 12/31/2019 2218   LEUKOCYTESUR 2+ 08/02/2013 0020    Radiological Exams on Admission: CT ABDOMEN PELVIS W CONTRAST  Result Date: 12/31/2019 CLINICAL DATA:  Chronic Foley catheter in place.  Fever. EXAM: CT ABDOMEN AND PELVIS WITH CONTRAST TECHNIQUE: Multidetector CT imaging of the abdomen and pelvis was performed using the standard protocol following bolus administration of intravenous contrast. CONTRAST:  127mL OMNIPAQUE IOHEXOL 300 MG/ML  SOLN COMPARISON:  10/17/2019 FINDINGS: Lower chest: No acute abnormality. Hepatobiliary: No focal hepatic abnormality. Gallbladder unremarkable. Pancreas: No focal  abnormality or ductal dilatation. Spleen: No focal abnormality.  Normal size. Adrenals/Urinary Tract: Mild fullness of the renal collecting systems bilaterally. Foley catheter in place within the bladder which is decompressed. Adrenal glands unremarkable. Stomach/Bowel: Stomach, large and small bowel grossly unremarkable. Vascular/Lymphatic: Calcified aorta and iliac vessels. No evidence of aneurysm or adenopathy. Reproductive: 6 cm septated cyst within the left ovary, stable since prior study. Uterus and right ovary unremarkable. Other: No free fluid or free air. Musculoskeletal: Postoperative changes and degenerative changes in the lumbar spine. No acute bony abnormality. IMPRESSION: Fullness of the renal collecting systems bilaterally, similar to prior study. No stones or overt hydronephrosis. Urinary bladder decompressed. 6 cm left ovarian complex septated cyst. This was seen on prior study, measuring larger, likely related to position and scan planes. No significant change suspected. This could be further characterized with ultrasound or MRI. Aortic atherosclerosis. Electronically Signed   By: Rolm Baptise M.D.   On: 12/31/2019 23:26    Assessment/Plan Principal Problem:   UTI (urinary tract infection) [name] is admitted to [place] floor. She is started on Rocephin for empiric antibiotic coverage.  Urine cultures were obtained emergency room will be monitored.  Antibiotic be adjusted based upon urine culture results. Patient has a chronic indwelling Foley catheter due to neurogenic bladder Patient has fever and meets SIRS criteria.  Tylenol as needed for fever  Active Problems:   Essential hypertension Home medications of Norvasc, hydralazine, lisinopril be continued.  Monitor blood pressure    AKI (acute kidney injury) ([place]) Patient with AKI on labs with increasing creatinine and decreasing GFR. Placed on LR for IV fluid hydration overnight. Check electrolytes and renal function in  morning.    Weakness Patient with generalized weakness and is not able to ambulate even  with assistance. Consult physical therapy for evaluation in the morning Patient was can be discharged home from the emergency room but she was not able to ambulate even with 3 nurses therefore it was unsafe for her to be sent home.    COPD (chronic obstructive pulmonary disease) (HCC) DuoNebs as needed for shortness of breath, cough, wheeze.    Neurogenic bladder Continue Foley catheter.  Continue Myrbetriq    Obesity, morbid ([place]) Patient will follow with PCP for dietary lifestyle interventions for weight loss    DVT prophylaxis: [place] score elevated.  Lovenox for DVT prophylaxis Code Status:   Full code.  CODE STATUS discussed with patient she states that she would want to be resuscitated if anything would happen to her Family Communication:  Diagnosis and plan discussed with patient.  Patient verbalized understanding agrees with plan.  Further recommendation follow as clinical indicated Disposition Plan:   Patient is from:  Home  Anticipated DC to:  Home versus SNF  Anticipated DC date:  Anticipate 2 midnight stay in the hospital to treat acute medical condition and evaluate  Anticipated DC barriers: Discharge plan will need to be lysed after physical therapy evaluation to see if patient will need home health versus                                                              inpatient rehab  Admission status:  Inpatient  Severity of Illness: The appropriate patient status for this patient is INPATIENT. Inpatient status is judged to be reasonable and necessary in order to provide the required intensity of service to ensure the patient's safety. The patient's presenting symptoms, physical exam findings, and initial radiographic and laboratory data in the context of their chronic comorbidities is felt to place them at high risk for further clinical deterioration. Furthermore, it is not anticipated  that the patient will be medically stable for discharge from the hospital within 2 midnights of admission. The following factors support the patient status of inpatient.    * I certify that at the point of admission it is my clinical judgment that the patient will require inpatient hospital care spanning beyond 2 midnights from the point of admission due to high intensity of service, high risk for further deterioration and high frequency of surveillance required.Yevonne Aline Foday [facility] MD [facility]  How to contact the [facility] Attending or Consulting provider [place] or covering provider during after hours [place], for this patient?   1. Check the care team in [facility] and look for a) attending/consulting [facility] provider listed and b) the [facility] team listed 2. Log into [email or web address] and use [place]'s universal password to access. If you do not have the password, please contact the hospital operator. 3. Locate the [facility] provider you are looking for under [facility] and page to a number that you can be directly reached. 4. If you still have difficulty reaching the provider, please page the [facility] (Director on Call) for the Hospitalists listed on amion for assistance.  01/01/2020, 5:27 AM

## 2020-01-01 NOTE — Progress Notes (Signed)
Pt worked with PT today and recommend rehad. Urine culture was collected. Pt has a fever, tylenol was given, MD was notified and blood culture was ordered, abx was changed. To monitor temperature.

## 2020-01-01 NOTE — Discharge Instructions (Addendum)
Please seek medical attention for any high fevers, chest pain, shortness of breath, change in behavior, persistent vomiting, bloody stool or any other new or concerning symptoms.  

## 2020-01-01 NOTE — ED Notes (Signed)
EMS transport arrived to take patient back to residence. Patient unable to get from bed to stretcher with 3 person assist. Patient now warm to touch. Patient's temperature taken. Patient 102.5. Patient placed by in bed. MD informed.

## 2020-01-01 NOTE — Progress Notes (Signed)
PROGRESS NOTE    Patient: [name]                            PCP: Kirk Ruths, MD                    DOB: 1950-06-14            DOA: 12/31/2019 CBU:[id number]             DOS: 01/01/2020, 7:10 AM   LOS: 0 days   Date of Service: The patient was seen and examined on 01/01/2020  Subjective:   The patient was seen and examined this Am. Hemodynamically stable Still complaining of severe generalized redness Otherwise no issues overnight .  Brief Narrative:   Per HPI:  [name] is a 69 y.o. female with medical history significant for COPD, hypertension, multiple sclerosis, depression/anxiety, neurogenic bladder, chronic indwelling Foley catheter, morbid obesity.  She presents for evaluation of fever that began a day before admission.     She reports she had a temperature to 102 degrees at home and she will take Tylenol for it.  She reports a sudden cloudy urine in her Foley catheter bag.  She wears a chronic Foley catheter secondary to neurogenic bladder.    Patient denies tobacco and alcohol use.  She states that she does smoke marijuana every 3 to 4 days.  ED Course:  Patient is found to have a UTI on urinalysis.  She was given dose of Rocephin. Her lactic acid level was normal at 1.3 and she was hemodynamically stable   Assessment & Plan:   Principal Problem:   UTI (urinary tract infection) Active Problems:   Essential hypertension   Neurogenic bladder   Obesity, morbid (HCC)   AKI (acute kidney injury) ([place])   Weakness   COPD (chronic obstructive pulmonary disease) (HCC)     UTI (urinary tract infection) -Continue current antibiotics of Rocephin -We will follow with cultures accordingly -Indwelling catheter was replaced in ED upon arrival   Patient has a chronic indwelling Foley catheter due to neurogenic bladder Patient has fever and meets SIRS criteria.  Tylenol as needed for fever  Active Problems:   Essential hypertension -Home medications  of Norvasc, hydralazine, lisinopril be continued.  Monitor blood pressure -Currently stable    AKI (acute kidney injury) ([place]) -Improving, continuing IV fluids Patient with AKI on labs with increasing creatinine and decreasing GFR. Placed on LR for IV fluid hydration overnight.   Severe generalized weaknesses, unable to ambulate   -Apparently at baseline ambulates with minimum assist -Now needs 3 person assist -Consulted PT/OT for evaluation recommendation -Patient stating she has not had a good experience in SNF in the past, prefer home health       COPD (chronic obstructive pulmonary disease) ([place]) -Stable no acute respiratory distress DuoNebs as needed for shortness of breath, cough, wheeze.    Neurogenic bladder Chronic Continue Foley catheter.  Continue Myrbetriq    Obesity, morbid (HCC)-COPD, hypertension, multiple sclerosis, depression/anxiety, neurogenic bladder, chronic indwelling Foley catheter, morbid obesity.   Patient will follow with PCP for dietary lifestyle interventions for weight loss    DVT prophylaxis:      [place] score elevated.  Lovenox for DVT prophylaxis Code Status:              Full code.  CODE STATUS discussed with patient she states that she would want to be resuscitated  if anything would happen to her Family Communication:       Diagnosis and plan discussed with patient.  Patient verbalized understanding agrees with plan.  Further recommendation follow as clinical indicated Disposition Plan:              Patient is from:                        Home             Anticipated DC to:                   Home versus SNF             Anticipated DC date:               Anticipate 2 midnight stay in the hospital to treat acute medical condition and evaluate             Anticipated DC barriers:         Discharge plan will need to be lysed after physical therapy evaluation to see if patient will need home health versus                                                               inpatient rehab  Admission status:     Inpatient     Antimicrobials: Rocephin   Consultants:  PT/OT         Procedures:   No admission procedures for hospital encounter.     Antimicrobials:  Anti-infectives (From admission, onward)   Start     Dose/Rate Route Frequency Ordered Stop   01/01/20 2000  cefTRIAXone (ROCEPHIN) 1 g in sodium chloride 0.9 % 100 mL IVPB        1 g 200 mL/hr over 30 Minutes Intravenous Every 24 hours 01/01/20 0627     01/01/20 0030  cefTRIAXone (ROCEPHIN) 1 g in sodium chloride 0.9 % 100 mL IVPB        1 g 200 mL/hr over 30 Minutes Intravenous  Once 01/01/20 0028 01/01/20 0108   01/01/20 0000  cephALEXin (KEFLEX) 500 MG capsule        500 mg Oral 4 times daily 01/01/20 0112 01/11/20 2359       Medication:  . amLODipine  5 mg Oral Daily  . atorvastatin  10 mg Oral Daily  . DULoxetine  60 mg Oral [id number]  . enoxaparin (LOVENOX) injection  40 mg Subcutaneous Q12H  . gabapentin  600 mg Oral TID  . hydrALAZINE  50 mg Oral TID  . lisinopril  20 mg Oral Daily  . mirabegron ER  50 mg Oral Daily  . Teriflunomide  14 mg Oral Daily  . cyanocobalamin  1,000 mcg Oral Daily  . vortioxetine HBr  5 mg Oral Daily    acetaminophen, ipratropium-albuterol, promethazine, senna-docusate   Objective:   Vitals:   01/01/20 0500 01/01/20 0526 01/01/20 0530 01/01/20 0634  BP: 127/60  (!) 123/59 (!) 122/54  Pulse: 74  78 67  Resp: (!) 30  (!) 25 19  Temp:  98.5 F (36.9 C)  98.6 F (37 C)  TempSrc:  Oral  Oral  SpO2: 93%  92% 98%  Weight:    123.6 kg  Height:    5\' 4"  (1.626 m)    Intake/Output Summary (Last 24 hours) at 01/01/2020 0710 Last data filed at 01/01/2020 4782 Gross per 24 hour  Intake -  Output 250 ml  Net -250 ml   Filed Weights   12/31/19 1510 01/01/20 0634  Weight: 122.5 kg 123.6 kg     Examination:   Physical Exam  Constitution:  Alert, cooperative, no distress,  Appears calm and comfortable   Psychiatric: Normal and stable mood and affect, cognition intact,   HEENT: Normocephalic, PERRL, otherwise with in Normal limits  Chest:Chest symmetric Cardio vascular:  S1/S2, RRR, No murmure, No Rubs or Gallops  pulmonary: Clear to auscultation bilaterally, respirations unlabored, negative wheezes / crackles Abdomen: Soft, non-tender, non-distended, bowel sounds,no masses, no organomegaly Muscular skeletal: Limited exam - in bed, able to move all 4 extremities, Normal strength,  Neuro: CNII-XII intact. , normal motor and sensation, reflexes intact  Extremities: No pitting edema lower extremities, +2 pulses  Skin: Dry, warm to touch, negative for any Rashes, No open wounds Wounds: per nursing documentation Pressure Injury 10/20/19 Buttocks Left Stage 2 -  Partial thickness loss of dermis presenting as a shallow open injury with a red, pink wound bed without slough. (Active)  10/20/19 2125  Location: Buttocks  Location Orientation: Left  Staging: Stage 2 -  Partial thickness loss of dermis presenting as a shallow open injury with a red, pink wound bed without slough.  Wound Description (Comments):   Present on Admission:      Pressure Injury 10/20/19 Coccyx Mid Stage 2 -  Partial thickness loss of dermis presenting as a shallow open injury with a red, pink wound bed without slough. (Active)  10/20/19 2125  Location: Coccyx  Location Orientation: Mid  Staging: Stage 2 -  Partial thickness loss of dermis presenting as a shallow open injury with a red, pink wound bed without slough.  Wound Description (Comments):   Present on Admission:     ------------------------------------------------------------------------------------------------------------------------------------------    LABs:  CBC Latest Ref Rng & Units 12/31/2019 11/24/2019 11/23/2019  WBC 4.0 - 10.5 K/uL 9.6 4.5 4.2  Hemoglobin 12.0 - 15.0 g/dL 12.0 11.8(L) 10.8(L)  Hematocrit 36 - 46 % 36.6 35.1(L) 33.5(L)  Platelets 150 -  400 K/uL 155 154 149(L)   CMP Latest Ref Rng & Units 12/31/2019 11/24/2019 11/23/2019  Glucose 70 - 99 mg/dL 118(H) 107(H) 97  BUN 8 - 23 mg/dL 18 18 13   Creatinine 0.44 - 1.00 mg/dL 1.39(H) 1.05(H) 1.03(H)  Sodium 135 - 145 mmol/L 133(L) 141 142  Potassium 3.5 - 5.1 mmol/L 3.8 3.9 3.4(L)  Chloride 98 - 111 mmol/L 98 103 107  CO2 22 - 32 mmol/L 22 28 26   Calcium 8.9 - 10.3 mg/dL 8.9 9.3 8.9  Total Protein 6.5 - 8.1 g/dL 7.8 - -  Total Bilirubin 0.3 - 1.2 mg/dL 0.7 - -  Alkaline Phos 38 - 126 U/L 70 - -  AST 15 - 41 U/L 16 - -  ALT 0 - 44 U/L 14 - -       Micro Results Recent Results (from the past 240 hour(s))  SARS Coronavirus 2 by RT PCR (hospital order, performed in [place] hospital lab) Nasopharyngeal     Status: None   Collection Time: 12/31/19 10:18 PM   Specimen: Nasopharyngeal  Result Value Ref Range Status   SARS Coronavirus 2 NEGATIVE NEGATIVE Final    Comment: (NOTE) SARS-CoV-2 target nucleic acids are NOT DETECTED.  The SARS-CoV-2  RNA is generally detectable in upper and lower respiratory specimens during the acute phase of infection. The lowest concentration of SARS-CoV-2 viral copies this assay can detect is 250 copies / mL. A negative result does not preclude SARS-CoV-2 infection and should not be used as the sole basis for treatment or other patient management decisions.  A negative result may occur with improper specimen collection / handling, submission of specimen other than nasopharyngeal swab, presence of viral mutation(s) within the areas targeted by this assay, and inadequate number of viral copies (<250 copies / mL). A negative result must be combined with clinical observations, patient history, and epidemiological information.  Fact Sheet for Patients:   StrictlyIdeas.no  Fact Sheet for Healthcare Providers: BankingDealers.co.za  This test is not yet approved or  cleared by the [place] FDA  and has been authorized for detection and/or diagnosis of SARS-CoV-2 by FDA under an Emergency Use Authorization (EUA).  This EUA will remain in effect (meaning this test can be used) for the duration of the COVID-19 declaration under Section 564(b)(1) of the Act, 21 U.S.C. section 360bbb-3(b)(1), unless the authorization is terminated or revoked sooner.  Performed at [facility], [address] [place], [place] [address]     Radiology Reports CT ABDOMEN PELVIS W CONTRAST  Result Date: 12/31/2019 CLINICAL DATA:  Chronic Foley catheter in place.  Fever. EXAM: CT ABDOMEN AND PELVIS WITH CONTRAST TECHNIQUE: Multidetector CT imaging of the abdomen and pelvis was performed using the standard protocol following bolus administration of intravenous contrast. CONTRAST:  154mL OMNIPAQUE IOHEXOL 300 MG/ML  SOLN COMPARISON:  10/17/2019 FINDINGS: Lower chest: No acute abnormality. Hepatobiliary: No focal hepatic abnormality. Gallbladder unremarkable. Pancreas: No focal abnormality or ductal dilatation. Spleen: No focal abnormality.  Normal size. Adrenals/Urinary Tract: Mild fullness of the renal collecting systems bilaterally. Foley catheter in place within the bladder which is decompressed. Adrenal glands unremarkable. Stomach/Bowel: Stomach, large and small bowel grossly unremarkable. Vascular/Lymphatic: Calcified aorta and iliac vessels. No evidence of aneurysm or adenopathy. Reproductive: 6 cm septated cyst within the left ovary, stable since prior study. Uterus and right ovary unremarkable. Other: No free fluid or free air. Musculoskeletal: Postoperative changes and degenerative changes in the lumbar spine. No acute bony abnormality. IMPRESSION: Fullness of the renal collecting systems bilaterally, similar to prior study. No stones or overt hydronephrosis. Urinary bladder decompressed. 6 cm left ovarian complex septated cyst. This was seen on prior study, measuring larger, likely related to  position and scan planes. No significant change suspected. This could be further characterized with ultrasound or MRI. Aortic atherosclerosis. Electronically Signed   By: Rolm Baptise M.D.   On: 12/31/2019 23:26    SIGNED: Deatra James, MD, FACP, FHM. [facility],  Pager (please use [email or web address] to page/text)  If 7PM-7AM, please contact night-coverage [email or web address], Password [facility] 01/01/2020, 7:10 AM

## 2020-01-01 NOTE — ED Provider Notes (Signed)
UA is concerning for urinary tract infection. CT shows left ovarian complex cyst which had been seen in the past. Discussed these with the patient. Will give dose of IV abx here in the emergency department. Did discuss admission vs discharge. At this time patient would like to be treated as an outpatient. I do think this is reasonable given lack of lactic acidosis or leukocytosis.    Nance Pear, MD 01/01/20 [phone]

## 2020-01-01 NOTE — Plan of Care (Signed)
  Problem: Health Behavior/Discharge Planning: Goal: Ability to manage health-related needs will improve Outcome: Progressing   

## 2020-01-02 DIAGNOSIS — A419 Sepsis, unspecified organism: Secondary | ICD-10-CM

## 2020-01-02 DIAGNOSIS — R6520 Severe sepsis without septic shock: Secondary | ICD-10-CM

## 2020-01-02 DIAGNOSIS — N170 Acute kidney failure with tubular necrosis: Secondary | ICD-10-CM

## 2020-01-02 LAB — BASIC METABOLIC PANEL
Anion gap: 8 (ref 5–15)
BUN: 23 mg/dL (ref 8–23)
CO2: 24 mmol/L (ref 22–32)
Calcium: 8.6 mg/dL — ABNORMAL LOW (ref 8.9–10.3)
Chloride: 102 mmol/L (ref 98–111)
Creatinine, Ser: 1.71 mg/dL — ABNORMAL HIGH (ref 0.44–1.00)
GFR calc Af Amer: 35 mL/min — ABNORMAL LOW (ref >60)
GFR calc non Af Amer: 30 mL/min — ABNORMAL LOW (ref >60)
Glucose, Bld: 105 mg/dL — ABNORMAL HIGH (ref 70–99)
Potassium: 3.4 mmol/L — ABNORMAL LOW (ref 3.5–5.1)
Sodium: 134 mmol/L — ABNORMAL LOW (ref 135–145)

## 2020-01-02 LAB — LACTIC ACID, PLASMA
Lactic Acid, Venous: 0.8 mmol/L (ref 0.5–1.9)
Lactic Acid, Venous: 1.7 mmol/L (ref 0.5–1.9)

## 2020-01-02 LAB — CBC
HCT: 31.9 % — ABNORMAL LOW (ref 36.0–46.0)
Hemoglobin: 10.4 g/dL — ABNORMAL LOW (ref 12.0–15.0)
MCH: 28.7 pg (ref 26.0–34.0)
MCHC: 32.6 g/dL (ref 30.0–36.0)
MCV: 88.1 fL (ref 80.0–100.0)
Platelets: 140 10*3/uL — ABNORMAL LOW (ref 150–400)
RBC: 3.62 MIL/uL — ABNORMAL LOW (ref 3.87–5.11)
RDW: 14.8 % (ref 11.5–15.5)
WBC: 9.8 10*3/uL (ref 4.0–10.5)
nRBC: 0 % (ref 0.0–0.2)

## 2020-01-02 LAB — PROTIME-INR
INR: 1.2 (ref 0.8–1.2)
Prothrombin Time: 14.4 seconds (ref 11.4–15.2)

## 2020-01-02 LAB — APTT: aPTT: 40 seconds — ABNORMAL HIGH (ref 24–36)

## 2020-01-02 MED ORDER — SODIUM CHLORIDE 0.9 % IV SOLN
2.0000 g | Freq: Once | INTRAVENOUS | Status: DC
  Filled 2020-01-02: qty 2

## 2020-01-02 MED ORDER — LACTATED RINGERS IV BOLUS (SEPSIS)
1000.0000 mL | Freq: Once | INTRAVENOUS | Status: AC
  Administered 2020-01-02: 1000 mL via INTRAVENOUS

## 2020-01-02 MED ORDER — IBUPROFEN 600 MG PO TABS
600.0000 mg | ORAL_TABLET | Freq: Once | ORAL | Status: AC
  Administered 2020-01-02: 600 mg via ORAL
  Filled 2020-01-02: qty 1

## 2020-01-02 MED ORDER — POTASSIUM CHLORIDE CRYS ER 20 MEQ PO TBCR
20.0000 meq | EXTENDED_RELEASE_TABLET | Freq: Two times a day (BID) | ORAL | Status: DC
  Administered 2020-01-02 – 2020-01-06 (×9): 20 meq via ORAL
  Filled 2020-01-02 (×9): qty 1

## 2020-01-02 MED ORDER — SODIUM CHLORIDE 0.9 % IV SOLN
2.0000 g | Freq: Two times a day (BID) | INTRAVENOUS | Status: DC
  Administered 2020-01-02 – 2020-01-06 (×8): 2 g via INTRAVENOUS
  Filled 2020-01-02 (×11): qty 2

## 2020-01-02 MED ORDER — LACTATED RINGERS IV BOLUS (SEPSIS)
800.0000 mL | Freq: Once | INTRAVENOUS | Status: AC
  Administered 2020-01-02: 800 mL via INTRAVENOUS

## 2020-01-02 MED ORDER — IBUPROFEN 600 MG PO TABS
600.0000 mg | ORAL_TABLET | Freq: Four times a day (QID) | ORAL | Status: DC | PRN
  Administered 2020-01-05 – 2020-01-06 (×2): 600 mg via ORAL
  Filled 2020-01-02 (×4): qty 1

## 2020-01-02 MED ORDER — LACTATED RINGERS IV SOLN: INTRAVENOUS | Status: AC

## 2020-01-02 MED ORDER — RISAQUAD PO CAPS
1.0000 | ORAL_CAPSULE | Freq: Three times a day (TID) | ORAL | Status: DC
  Administered 2020-01-02 – 2020-01-06 (×9): 1 via ORAL
  Filled 2020-01-02 (×18): qty 1

## 2020-01-02 NOTE — Progress Notes (Signed)
PROGRESS NOTE    Patient: [name]                            PCP: Kirk Ruths, MD                    DOB: 11-12-1950            DOA: 12/31/2019 [id number]             DOS: 01/02/2020, 12:02 PM   LOS: 1 day   Date of Service: The patient was seen and examined on 01/02/2020  Subjective:   The patient was seen and examined this morning, stable of no complaint but febrile with a temp of 101.6. Yesterday evening also was reported patient was febrile, blood cultures were obtained, antibiotics was changed from Rocephin to Zosyn. She is otherwise hemodynamically stable, mildly hypertensive blood pressure 101/65 She is awake alert, denies having any shortness of breath or chest pain.  Brief Narrative:   Per HPI:  [name] is a 69 y.o. female with medical history significant for COPD, hypertension, multiple sclerosis, depression/anxiety, neurogenic bladder, chronic indwelling Foley catheter, morbid obesity.  She presents for evaluation of fever that began a day before admission.     She reports she had a temperature to 102 degrees at home and she will take Tylenol for it.  She reports a sudden cloudy urine in her Foley catheter bag.  She wears a chronic Foley catheter secondary to neurogenic bladder.    Patient denies tobacco and alcohol use.  She states that she does smoke marijuana every 3 to 4 days.  ED Course:  Patient is found to have a UTI on urinalysis.  She was given dose of Rocephin. Her lactic acid level was normal at 1.3 and she was hemodynamically stable   Assessment & Plan:   Principal Problem:   Sepsis ([name]) Active Problems:   UTI (urinary tract infection)   Essential hypertension   Neurogenic bladder   Obesity, morbid (HCC)   AKI (acute kidney injury) ([name])   Weakness   COPD (chronic obstructive pulmonary disease) (HCC)    Sepsis -likely due to urinary tract infection-chronic indwelling cath -Patient meets the septic criteria --with developing  leukocytosis 14.1, elevated creatinine 1.71, Temp. of 101/65, as low as 88/53 -Per sepsis protocol, initiating IV fluid resuscitation,, blood cultures have been obtained, urine cultures have been obtained -Broaden antibiotics from Rocephin to Zosyn -No indication for vancomycin    UTI (urinary tract infection) -Continue current antibiotics of Rocephin >>> switch to IV Zosyn As patient developing and meeting sepsis criteria as above  Patient has a chronic indwelling Foley catheter due to neurogenic bladder Patient has fever and meets SIRS criteria.  Tylenol as needed for fever  Hypotension -Continue aggressive IV fluid resuscitation with sepsis protocol,  Monitoring closely   Active Problems:   Essential hypertension -currently hypotensive -Home medication of hydralazine Norvasc lisinopril will be held today     AKI (acute kidney injury) [facility]) -May be exacerbated by sepsis, dehydration -Monitoring closely, worsening creatinine 1.39--1.74 today Patient with AKI on labs with increasing creatinine and decreasing GFR. Placed on LR for IV fluid hydration overnight.   Severe generalized weaknesses, unable to ambulate   -Apparently at baseline ambulates with minimum assist -Now needs 3 person assist -- -consulted PT/OT -Consulted social worker for likely placement -Due to past experiences refusing SNF, open to home health  COPD (chronic obstructive pulmonary disease) (HCC) -No distress, stable DuoNebs as needed for shortness of breath, cough, wheeze.    Neurogenic bladder Chronic Continue Foley catheter.  Continue Myrbetriq    Obesity, morbid (HCC)-COPD, hypertension, multiple sclerosis, depression/anxiety, neurogenic bladder, chronic indwelling Foley catheter, morbid obesity.   Patient will follow with PCP for dietary lifestyle interventions for weight loss    DVT prophylaxis:      [place] score elevated.  Lovenox for DVT prophylaxis Code Status:               Full code.  CODE STATUS discussed with patient she states that she would want to be resuscitated if anything would happen to her Family Communication:       Diagnosis and plan discussed with patient.  Patient verbalized understanding agrees with plan.  Further recommendation follow as clinical indicated Disposition Plan:              Patient is from:                        Home             Anticipated DC to:                   Home versus SNF             Anticipated DC date:               Anticipate 2 midnight stay in the hospital to treat acute medical condition and evaluate             Anticipated DC barriers:         Discharge plan will need to be lysed after physical therapy evaluation to see if patient will need home health versus                                                              inpatient rehab  Admission status:     Inpatient     Cultures : Urine cultures 12/31/2019 >> Blood cultures 01/01/2020 >>   Antimicrobials: Rocephin D/Ced -- 01/01/2020 01/01/2020 IV Zosyn >> 01/02/2020 Per sepsis bundle recommendation cefepime initiated 01/02/2020 >>  Consultants:  PT/OT    Procedures:   No admission procedures for hospital encounter.     Antimicrobials:  Anti-infectives (From admission, onward)   Start     Dose/Rate Route Frequency Ordered Stop   01/01/20 2000  cefTRIAXone (ROCEPHIN) 1 g in sodium chloride 0.9 % 100 mL IVPB  Status:  Discontinued        1 g 200 mL/hr over 30 Minutes Intravenous Every 24 hours 01/01/20 0627 01/01/20 1652   01/01/20 1700  piperacillin-tazobactam (ZOSYN) IVPB 3.375 g        3.375 g 12.5 mL/hr over 240 Minutes Intravenous Every 8 hours 01/01/20 1651     01/01/20 0030  cefTRIAXone (ROCEPHIN) 1 g in sodium chloride 0.9 % 100 mL IVPB        1 g 200 mL/hr over 30 Minutes Intravenous  Once 01/01/20 0028 01/01/20 0108   01/01/20 0000  cephALEXin (KEFLEX) 500 MG capsule        500 mg Oral 4 times daily 01/01/20 0112 01/11/20 2359  Medication:  . acidophilus  1 capsule Oral TID  . amLODipine  5 mg Oral Daily  . atorvastatin  10 mg Oral Daily  . Chlorhexidine Gluconate Cloth  6 each Topical Daily  . clonazePAM  0.5 mg Oral QHS  . DULoxetine  60 mg Oral BH-q7a  . enoxaparin (LOVENOX) injection  40 mg Subcutaneous Q12H  . gabapentin  600 mg Oral TID  . hydrALAZINE  50 mg Oral TID  . lisinopril  20 mg Oral Daily  . mirabegron ER  50 mg Oral Daily  . potassium chloride  20 mEq Oral BID  . QUEtiapine Fumarate  150 mg Oral QHS  . Teriflunomide  14 mg Oral Daily  . cyanocobalamin  1,000 mcg Oral Daily  . vortioxetine HBr  5 mg Oral Daily    acetaminophen, ibuprofen, ipratropium-albuterol, promethazine, senna-docusate   Objective:   Vitals:   01/02/20 0928 01/02/20 1042 01/02/20 1130 01/02/20 1134  BP: (!) 88/53 (!) 107/53 (!) 98/40 101/65  Pulse: 94 83 85   Resp: 19     Temp: 100.3 F (37.9 C)  98.6 F (37 C)   TempSrc: Oral  Oral   SpO2: 95%  95%   Weight:      Height:        Intake/Output Summary (Last 24 hours) at 01/02/2020 1202 Last data filed at 01/02/2020 1012 Gross per 24 hour  Intake 2168.22 ml  Output 1000 ml  Net 1168.22 ml   Filed Weights   12/31/19 1510 01/01/20 0634  Weight: 122.5 kg 123.6 kg     Examination:      Physical Exam:   General:  Alert, oriented, cooperative, no distress;   HEENT:  Normocephalic, PERRL, otherwise with in Normal limits   Neuro:  CNII-XII intact. , normal motor and sensation, reflexes intact   Lungs:   Clear to auscultation BL, Respirations unlabored, no wheezes / crackles  Cardio:    S1/S2, RRR, No murmure, No Rubs or Gallops   Abdomen:   Soft, non-tender, bowel sounds active all four quadrants,  no guarding or peritoneal signs.  Muscular skeletal:  Limited exam - in bed, able to move all 4 extremities, Normal strength,  2+ pulses,  symmetric, No pitting edema  Skin:  Dry, warm to touch, negative for any Rashes, No open wounds  Wounds:  Please see nursing documentation  Pressure Injury 10/20/19 Buttocks Left Stage 2 -  Partial thickness loss of dermis presenting as a shallow open injury with a red, pink wound bed without slough. (Active)  10/20/19 2125  Location: Buttocks  Location Orientation: Left  Staging: Stage 2 -  Partial thickness loss of dermis presenting as a shallow open injury with a red, pink wound bed without slough.  Wound Description (Comments):   Present on Admission:      Pressure Injury 10/20/19 Coccyx Mid Stage 2 -  Partial thickness loss of dermis presenting as a shallow open injury with a red, pink wound bed without slough. (Active)  10/20/19 2125  Location: Coccyx  Location Orientation: Mid  Staging: Stage 2 -  Partial thickness loss of dermis presenting as a shallow open injury with a red, pink wound bed without slough.  Wound Description (Comments):   Present on Admission:          ------------------------------------------------------------------------------------------------------------------------------------------    LABs:  CBC Latest Ref Rng & Units 01/02/2020 01/01/2020 12/31/2019  WBC 4.0 - 10.5 K/uL 9.8 14.1(H) 9.6  Hemoglobin 12.0 - 15.0 g/dL 10.4(L) 12.0 12.0  Hematocrit 36 - 46 % 31.9(L) 36.7 36.6  Platelets 150 - 400 K/uL 140(L) 170 155   CMP Latest Ref Rng & Units 01/02/2020 01/01/2020 12/31/2019  Glucose 70 - 99 mg/dL 105(H) - 118(H)  BUN 8 - 23 mg/dL 23 - 18  Creatinine 0.44 - 1.00 mg/dL 1.71(H) 1.74(H) 1.39(H)  Sodium 135 - 145 mmol/L 134(L) - 133(L)  Potassium 3.5 - 5.1 mmol/L 3.4(L) - 3.8  Chloride 98 - 111 mmol/L 102 - 98  CO2 22 - 32 mmol/L 24 - 22  Calcium 8.9 - 10.3 mg/dL 8.6(L) - 8.9  Total Protein 6.5 - 8.1 g/dL - - 7.8  Total Bilirubin 0.3 - 1.2 mg/dL - - 0.7  Alkaline Phos 38 - 126 U/L - - 70  AST 15 - 41 U/L - - 16  ALT 0 - 44 U/L - - 14       Micro Results Recent Results (from the past 240 hour(s))  SARS Coronavirus 2 by RT PCR (hospital order, performed in  [place] hospital lab) Nasopharyngeal     Status: None   Collection Time: 12/31/19 10:18 PM   Specimen: Nasopharyngeal  Result Value Ref Range Status   SARS Coronavirus 2 NEGATIVE NEGATIVE Final    Comment: (NOTE) SARS-CoV-2 target nucleic acids are NOT DETECTED.  The SARS-CoV-2 RNA is generally detectable in upper and lower respiratory specimens during the acute phase of infection. The lowest concentration of SARS-CoV-2 viral copies this assay can detect is 250 copies / mL. A negative result does not preclude SARS-CoV-2 infection and should not be used as the sole basis for treatment or other patient management decisions.  A negative result may occur with improper specimen collection / handling, submission of specimen other than nasopharyngeal swab, presence of viral mutation(s) within the areas targeted by this assay, and inadequate number of viral copies (<250 copies / mL). A negative result must be combined with clinical observations, patient history, and epidemiological information.  Fact Sheet for Patients:   StrictlyIdeas.no  Fact Sheet for Healthcare Providers: BankingDealers.co.za  This test is not yet approved or  cleared by the [place] FDA and has been authorized for detection and/or diagnosis of SARS-CoV-2 by FDA under an Emergency Use Authorization (EUA).  This EUA will remain in effect (meaning this test can be used) for the duration of the COVID-19 declaration under Section 564(b)(1) of the Act, 21 U.S.C. section 360bbb-3(b)(1), unless the authorization is terminated or revoked sooner.  Performed at [facility], [address] [place], [place] [address]   CULTURE, BLOOD (ROUTINE X 2) w Reflex to ID Panel     Status: None (Preliminary result)   Collection Time: 01/01/20  6:38 PM   Specimen: BLOOD  Result Value Ref Range Status   Specimen Description BLOOD BLOOD LEFT HAND  Final   Special  Requests   Final    BOTTLES DRAWN AEROBIC AND ANAEROBIC Blood Culture results may not be optimal due to an inadequate volume of blood received in culture bottles   Culture   Final    NO GROWTH < 12 HOURS Performed at [facility], [address] [place], [place] [address]    Report Status PENDING  Incomplete  CULTURE, BLOOD (ROUTINE X 2) w Reflex to ID Panel     Status: None (Preliminary result)   Collection Time: 01/01/20  6:40 PM   Specimen: BLOOD  Result Value Ref Range Status   Specimen Description BLOOD LEFT ANTECUBITAL  Final   Special  Requests   Final    BOTTLES DRAWN AEROBIC AND ANAEROBIC Blood Culture adequate volume   Culture   Final    NO GROWTH < 12 HOURS Performed at [facility], [address] [place], [place] [address]    Report Status PENDING  Incomplete    Radiology Reports CT ABDOMEN PELVIS W CONTRAST  Result Date: 12/31/2019 CLINICAL DATA:  Chronic Foley catheter in place.  Fever. EXAM: CT ABDOMEN AND PELVIS WITH CONTRAST TECHNIQUE: Multidetector CT imaging of the abdomen and pelvis was performed using the standard protocol following bolus administration of intravenous contrast. CONTRAST:  175mL OMNIPAQUE IOHEXOL 300 MG/ML  SOLN COMPARISON:  10/17/2019 FINDINGS: Lower chest: No acute abnormality. Hepatobiliary: No focal hepatic abnormality. Gallbladder unremarkable. Pancreas: No focal abnormality or ductal dilatation. Spleen: No focal abnormality.  Normal size. Adrenals/Urinary Tract: Mild fullness of the renal collecting systems bilaterally. Foley catheter in place within the bladder which is decompressed. Adrenal glands unremarkable. Stomach/Bowel: Stomach, large and small bowel grossly unremarkable. Vascular/Lymphatic: Calcified aorta and iliac vessels. No evidence of aneurysm or adenopathy. Reproductive: 6 cm septated cyst within the left ovary, stable since prior study. Uterus and right ovary unremarkable. Other: No free fluid or free air.  Musculoskeletal: Postoperative changes and degenerative changes in the lumbar spine. No acute bony abnormality. IMPRESSION: Fullness of the renal collecting systems bilaterally, similar to prior study. No stones or overt hydronephrosis. Urinary bladder decompressed. 6 cm left ovarian complex septated cyst. This was seen on prior study, measuring larger, likely related to position and scan planes. No significant change suspected. This could be further characterized with ultrasound or MRI. Aortic atherosclerosis. Electronically Signed   By: Rolm Baptise M.D.   On: 12/31/2019 23:26    SIGNED: Deatra James, MD, FACP, FHM. [facility],  Pager (please use [email or web address] to page/text)  If 7PM-7AM, please contact night-coverage [email or web address] Ota [facility] 01/02/2020, 12:02 PM

## 2020-01-02 NOTE — Progress Notes (Signed)
Pharmacy Antibiotic Note  [name] is a 69 y.o. female admitted on 12/31/2019 with sepsis secondary to UTI. Patient has PMH for neurogenic bladder and chronic indwelling Foley catheter. Patient was initially on CTX then broadened to Zosyn. Pharmacy has been consulted for Cefepime dosing.  Plan: Will give Cefepime IV 2g q12h Monitor renal function and adjust dose as needed  Height: 5\' 4"  (162.6 cm) Weight: 123.6 kg (272 lb 8 oz) IBW/kg (Calculated) : 54.7  Temp (24hrs), Avg:100.2 F (37.9 C), Min:98.6 F (37 C), Max:101.6 F (38.7 C)  Recent Labs  Lab 12/31/19 1524 01/01/20 0951 01/02/20 0324 01/02/20 0820 01/02/20 1049  WBC 9.6 14.1* 9.8  --   --   CREATININE 1.39* 1.74* 1.71*  --   --   LATICACIDVEN 1.3  --   --  0.8 1.7    Estimated Creatinine Clearance: 40.3 mL/min (A) (by C-G formula based on SCr of 1.71 mg/dL (H)).    No Known Allergies  Antimicrobials this admission: 9/4 CTX x1 9/4 Zosyn >> 9/5 9/5 Cefepime >>   Microbiology results: 9/4 BCx: NG <12h 9/4 UCx: sent  Thank you for allowing pharmacy to be a part of this patient's care.  Sherilyn Banker, PharmD Pharmacy Resident  01/02/2020 12:30 PM

## 2020-01-02 NOTE — Progress Notes (Signed)
Pt had low BP over the shift, MD was notified and place the pt to Sepsis code. 1800L of LR was given and abx was change. Tele was placed, NSinus. Last BM was 105/43mmHg, asymptomatic. MD was informed and want to observe the VS, tele will be kept for monitoring, hold BP meds, continue fluid LR 127mm/hr. Will inform Night RN.

## 2020-01-03 LAB — BASIC METABOLIC PANEL
Anion gap: 10 (ref 5–15)
BUN: 21 mg/dL (ref 8–23)
CO2: 22 mmol/L (ref 22–32)
Calcium: 8.5 mg/dL — ABNORMAL LOW (ref 8.9–10.3)
Chloride: 104 mmol/L (ref 98–111)
Creatinine, Ser: 1.37 mg/dL — ABNORMAL HIGH (ref 0.44–1.00)
GFR calc Af Amer: 45 mL/min — ABNORMAL LOW (ref >60)
GFR calc non Af Amer: 39 mL/min — ABNORMAL LOW (ref >60)
Glucose, Bld: 112 mg/dL — ABNORMAL HIGH (ref 70–99)
Potassium: 3.3 mmol/L — ABNORMAL LOW (ref 3.5–5.1)
Sodium: 136 mmol/L (ref 135–145)

## 2020-01-03 LAB — CBC WITH DIFFERENTIAL/PLATELET
Abs Immature Granulocytes: 0.03 10*3/uL (ref 0.00–0.07)
Basophils Absolute: 0.1 10*3/uL (ref 0.0–0.1)
Basophils Relative: 1 %
Eosinophils Absolute: 0.2 10*3/uL (ref 0.0–0.5)
Eosinophils Relative: 3 %
HCT: 31.9 % — ABNORMAL LOW (ref 36.0–46.0)
Hemoglobin: 10.8 g/dL — ABNORMAL LOW (ref 12.0–15.0)
Immature Granulocytes: 1 %
Lymphocytes Relative: 9 %
Lymphs Abs: 0.6 10*3/uL — ABNORMAL LOW (ref 0.7–4.0)
MCH: 28.8 pg (ref 26.0–34.0)
MCHC: 33.9 g/dL (ref 30.0–36.0)
MCV: 85.1 fL (ref 80.0–100.0)
Monocytes Absolute: 0.8 10*3/uL (ref 0.1–1.0)
Monocytes Relative: 12 %
Neutro Abs: 4.8 10*3/uL (ref 1.7–7.7)
Neutrophils Relative %: 74 %
Platelets: 139 10*3/uL — ABNORMAL LOW (ref 150–400)
RBC: 3.75 MIL/uL — ABNORMAL LOW (ref 3.87–5.11)
RDW: 14.8 % (ref 11.5–15.5)
WBC: 6.4 10*3/uL (ref 4.0–10.5)
nRBC: 0 % (ref 0.0–0.2)

## 2020-01-03 LAB — URINE CULTURE: Culture: NO GROWTH

## 2020-01-03 NOTE — Plan of Care (Signed)
  Problem: Health Behavior/Discharge Planning: Goal: Ability to manage health-related needs will improve Outcome: Progressing   

## 2020-01-03 NOTE — TOC Progression Note (Signed)
Transition of Care [facility]) - Progression Note    Patient Details  Name: [name] MRN: [id number] Date of Birth: June 23, 1950  [facility] CM/SW [place], LCSW Phone Number: 01/03/2020, 3:21 PM  Clinical Narrative:    CSW spoke with patient to discuss discharge plan. Patient requested placement at [facility] or [facility]. CSW completed SNF work-up and sent out to these two places. CSW requested auth thru portal        Expected Discharge Plan and Services                                                 Social Determinants of Health (SDOH) Interventions    Readmission Risk Interventions Readmission Risk Prevention Plan 04/22/2019 02/04/2019 01/07/2019  Transportation Screening Complete - Complete  PCP or Specialist Appt within 3-5 Days - - Complete  HRI or Home Care Consult - Complete Complete  Social Work Consult for [place] Planning/Counseling - Complete Complete  Palliative Care Screening - Not Applicable Not Applicable  Medication Review Press photographer) Referral to Pharmacy - Complete  PCP or Specialist appointment within 3-5 days of discharge Complete - -  [place] or Home Care Consult Complete - -  SW Recovery Care/Counseling Consult Complete - -  Palliative Care Screening Not Applicable - -  Skilled Nursing Facility Complete - -  Some recent data might be hidden

## 2020-01-03 NOTE — Progress Notes (Signed)
PROGRESS NOTE    Patient: [name]                            PCP: Kirk Ruths, MD                    DOB: [birth year]            DOA: 12/31/2019 ENI:[id number]             DOS: 01/03/2020, 12:18 PM   LOS: 2 days   Date of Service: The patient was seen and examined on 01/03/2020  Subjective:   The patient was seen and examined this morning, hemodynamically stable, blood pressure is improved Tolerated aggressive IV fluid resuscitation due to sepsis. Awake alert no acute distress no issues overnight BP meds were held, blood pressure is improved  Brief Narrative:   Per HPI:  [name] is a 69 y.o. female with medical history significant for COPD, hypertension, multiple sclerosis, depression/anxiety, neurogenic bladder, chronic indwelling Foley catheter, morbid obesity.  She presents for evaluation of fever that began a day before admission.     She reports she had a temperature to 102 degrees at home and she will take Tylenol for it.  She reports a sudden cloudy urine in her Foley catheter bag.  She wears a chronic Foley catheter secondary to neurogenic bladder.    Patient denies tobacco and alcohol use.  She states that she does smoke marijuana every 3 to 4 days.  ED Course:  Patient is found to have a UTI on urinalysis.  She was given dose of Rocephin. Her lactic acid level was normal at 1.3 and she was hemodynamically stable   Assessment & Plan:   Principal Problem:   Sepsis ([place]) Active Problems:   UTI (urinary tract infection)   Essential hypertension   Neurogenic bladder   Obesity, morbid (HCC)   AKI (acute kidney injury) ([place])   Weakness   COPD (chronic obstructive pulmonary disease) (HCC)    Sepsis -likely due to urinary tract infection-chronic indwelling cath Improved-hemodynamically stable now  -Sepsis protocol was initiated 01/02/2020 aggressive fluid resuscitation with LR Antibiotic was switched from Rocephin, Zosyn to cefepime  -Blood and  urine cultures  >>> no growth to date   -Patient meets the septic criteria 01/02/20--with developing leukocytosis 14.1, elevated creatinine 1.71, Temp. of 101/65, as low as 88/53 -Status post Per sepsis protocol, initiating IV fluid resuscitation,, blood cultures have been obtained, urine cultures have been obtained -Broaden antibiotics from Rocephin to Zosyn -No indication for vancomycin    UTI (urinary tract infection) -Continue current antibiotics of Rocephin >>> switch to IV Zosyn >> switch to cefepime 01/03/2020 As patient developing and meeting sepsis criteria as above  Patient has a chronic indwelling Foley catheter due to neurogenic bladder Patient has fever and meets SIRS criteria.  Tylenol as needed for fever  Hypotension -Resolved status post IV fluid resuscitation, Home blood pressure medication has been DC'd at this time---   Active Problems:   Essential hypertension -currently hypotensive -Home medication of hydralazine Norvasc lisinopril will be held today     AKI (acute kidney injury) ([place]) -Exacerbated by dehydration, hypotension, sepsis --- improving -Monitoring closely, worsening creatinine 1.39--1.74 >>1.71 today Patient with AKI on labs with increasing creatinine and decreasing GFR. Status post IV fluid resuscitation   Severe generalized weaknesses, unable to ambulate   -Apparently at baseline ambulates with minimum assist -Now needs 3  person assist -- -consulted PT/OT -Consulted social worker for likely placement -Due to past experiences refusing SNF, open to home health     COPD (chronic obstructive pulmonary disease) ([place]) -Stable no respiratory distress DuoNebs as needed for shortness of breath, cough, wheeze.    Neurogenic bladder -Chronic Continue Foley catheter.  Continue Myrbetriq    Obesity, morbid (HCC)-COPD, hypertension, multiple sclerosis, depression/anxiety, neurogenic bladder, chronic indwelling Foley catheter, morbid obesity.    Patient will follow with PCP for dietary lifestyle interventions for weight loss  Depression -Patient complained of depressed mood, but denies any suicidal homicidal ideation Patient currently on Cymbalta, Neurontin, Seroquel,  DVT prophylaxis:      [place] score elevated.  Lovenox for DVT prophylaxis Code Status:              Full code.  CODE STATUS discussed with patient she states that she would want to be resuscitated if anything would happen to her Family Communication:       Diagnosis and plan discussed with patient.  Patient verbalized understanding agrees with plan.  Further recommendation follow as clinical indicated Disposition Plan:              Patient is from:                        Home             Anticipated DC to:                   Home versus SNF             Anticipated DC date:              In 2-3 days             Anticipated DC barriers:         Discharge plan will need to be lysed after physical therapy evaluation to see if patient will need home health versus                                                              inpatient rehab  Admission status:     Inpatient     Cultures : Urine cultures 12/31/2019 >> Blood cultures 01/01/2020 >>   Antimicrobials: Rocephin D/Ced -- 01/01/2020 01/01/2020 IV Zosyn >> 01/02/2020 Per sepsis bundle recommendation cefepime initiated 01/02/2020 >>  Consultants:  PT/OT    Procedures:   No admission procedures for hospital encounter.     Antimicrobials:  Anti-infectives (From admission, onward)   Start     Dose/Rate Route Frequency Ordered Stop   01/02/20 1300  ceFEPIme (MAXIPIME) 2 g in sodium chloride 0.9 % 100 mL IVPB        2 g 200 mL/hr over 30 Minutes Intravenous Every 12 hours 01/02/20 1226     01/02/20 1230  ceFEPIme (MAXIPIME) 2 g in sodium chloride 0.9 % 100 mL IVPB  Status:  Discontinued        2 g 200 mL/hr over 30 Minutes Intravenous  Once 01/02/20 1206 01/02/20 1226   01/01/20 2000  cefTRIAXone  (ROCEPHIN) 1 g in sodium chloride 0.9 % 100 mL IVPB  Status:  Discontinued        1 g 200 mL/hr over  30 Minutes Intravenous Every 24 hours 01/01/20 0627 01/01/20 1652   01/01/20 1700  piperacillin-tazobactam (ZOSYN) IVPB 3.375 g  Status:  Discontinued        3.375 g 12.5 mL/hr over 240 Minutes Intravenous Every 8 hours 01/01/20 1651 01/02/20 1206   01/01/20 0030  cefTRIAXone (ROCEPHIN) 1 g in sodium chloride 0.9 % 100 mL IVPB        1 g 200 mL/hr over 30 Minutes Intravenous  Once 01/01/20 0028 01/01/20 0108   01/01/20 0000  cephALEXin (KEFLEX) 500 MG capsule        500 mg Oral 4 times daily 01/01/20 0112 01/11/20 2359       Medication:  . acidophilus  1 capsule Oral TID  . atorvastatin  10 mg Oral Daily  . Chlorhexidine Gluconate Cloth  6 each Topical Daily  . clonazePAM  0.5 mg Oral QHS  . DULoxetine  60 mg Oral BH-q7a  . enoxaparin (LOVENOX) injection  40 mg Subcutaneous Q12H  . gabapentin  600 mg Oral TID  . mirabegron ER  50 mg Oral Daily  . potassium chloride  20 mEq Oral BID  . QUEtiapine Fumarate  150 mg Oral QHS  . Teriflunomide  14 mg Oral Daily  . cyanocobalamin  1,000 mcg Oral Daily  . vortioxetine HBr  5 mg Oral Daily    acetaminophen, ibuprofen, ipratropium-albuterol, promethazine, senna-docusate   Objective:   Vitals:   01/02/20 1537 01/02/20 1951 01/03/20 0010 01/03/20 0746  BP: (!) 104/52 (!) 128/58 (!) 138/54 (!) 131/49  Pulse: 74 76 78 89  Resp:  20 18 16   Temp:  98.5 F (36.9 C) 98.6 F (37 C) 100.2 F (37.9 C)  TempSrc:  Oral Oral Oral  SpO2:  99% 98% 95%  Weight:      Height:        Intake/Output Summary (Last 24 hours) at 01/03/2020 1218 Last data filed at 01/03/2020 1021 Gross per 24 hour  Intake 3151.92 ml  Output 2300 ml  Net 851.92 ml   Filed Weights   12/31/19 1510 01/01/20 0634  Weight: 122.5 kg 123.6 kg     Examination:       Physical Exam:   General:  Alert, oriented, cooperative, no distress;   HEENT:  Normocephalic,  PERRL, otherwise with in Normal limits   Neuro:  CNII-XII intact. , normal motor and sensation, reflexes intact   Lungs:   Clear to auscultation BL, Respirations unlabored, no wheezes / crackles  Cardio:    S1/S2, RRR, No murmure, No Rubs or Gallops   Abdomen:   Soft, non-tender, bowel sounds active all four quadrants,  no guarding or peritoneal signs.  Muscular skeletal:   Severe generalized symmetric weakness  Limited exam - in bed, able to move all 4 extremities,  2+ pulses,  symmetric, No pitting edema  Skin:  Dry, warm to touch, negative for any Rashes, No open wounds  Wounds: Please see nursing documentation Pressure Injury 10/20/19 Buttocks Left Stage 2 -  Partial thickness loss of dermis presenting as a shallow open injury with a red, pink wound bed without slough. (Active)  10/20/19 2125  Location: Buttocks  Location Orientation: Left  Staging: Stage 2 -  Partial thickness loss of dermis presenting as a shallow open injury with a red, pink wound bed without slough.  Wound Description (Comments):   Present on Admission:      Pressure Injury 10/20/19 Coccyx Mid Stage 2 -  Partial thickness loss of dermis presenting  as a shallow open injury with a red, pink wound bed without slough. (Active)  10/20/19 2125  Location: Coccyx  Location Orientation: Mid  Staging: Stage 2 -  Partial thickness loss of dermis presenting as a shallow open injury with a red, pink wound bed without slough.  Wound Description (Comments):   Present on Admission:            ------------------------------------------------------------------------------------------------------------------------------------------    LABs:  CBC Latest Ref Rng & Units 01/03/2020 01/02/2020 01/01/2020  WBC 4.0 - 10.5 K/uL 6.4 9.8 14.1(H)  Hemoglobin 12.0 - 15.0 g/dL 10.8(L) 10.4(L) 12.0  Hematocrit 36 - 46 % 31.9(L) 31.9(L) 36.7  Platelets 150 - 400 K/uL 139(L) 140(L) 170   CMP Latest Ref Rng & Units 01/03/2020 01/02/2020 01/01/2020   Glucose 70 - 99 mg/dL 112(H) 105(H) -  BUN 8 - 23 mg/dL 21 23 -  Creatinine 0.44 - 1.00 mg/dL 1.37(H) 1.71(H) 1.74(H)  Sodium 135 - 145 mmol/L 136 134(L) -  Potassium 3.5 - 5.1 mmol/L 3.3(L) 3.4(L) -  Chloride 98 - 111 mmol/L 104 102 -  CO2 22 - 32 mmol/L 22 24 -  Calcium 8.9 - 10.3 mg/dL 8.5(L) 8.6(L) -  Total Protein 6.5 - 8.1 g/dL - - -  Total Bilirubin 0.3 - 1.2 mg/dL - - -  Alkaline Phos 38 - 126 U/L - - -  AST 15 - 41 U/L - - -  ALT 0 - 44 U/L - - -       Micro Results Recent Results (from the past 240 hour(s))  SARS Coronavirus 2 by RT PCR (hospital order, performed in [place] hospital lab) Nasopharyngeal     Status: None   Collection Time: 12/31/19 10:18 PM   Specimen: Nasopharyngeal  Result Value Ref Range Status   SARS Coronavirus 2 NEGATIVE NEGATIVE Final    Comment: (NOTE) SARS-CoV-2 target nucleic acids are NOT DETECTED.  The SARS-CoV-2 RNA is generally detectable in upper and lower respiratory specimens during the acute phase of infection. The lowest concentration of SARS-CoV-2 viral copies this assay can detect is 250 copies / mL. A negative result does not preclude SARS-CoV-2 infection and should not be used as the sole basis for treatment or other patient management decisions.  A negative result may occur with improper specimen collection / handling, submission of specimen other than nasopharyngeal swab, presence of viral mutation(s) within the areas targeted by this assay, and inadequate number of viral copies (<250 copies / mL). A negative result must be combined with clinical observations, patient history, and epidemiological information.  Fact Sheet for Patients:   StrictlyIdeas.no  Fact Sheet for Healthcare Providers: BankingDealers.co.za  This test is not yet approved or  cleared by the [place] FDA and has been authorized for detection and/or diagnosis of SARS-CoV-2 by FDA under an  Emergency Use Authorization (EUA).  This EUA will remain in effect (meaning this test can be used) for the duration of the COVID-19 declaration under Section 564(b)(1) of the Act, 21 U.S.C. section 360bbb-3(b)(1), unless the authorization is terminated or revoked sooner.  Performed at [facility], [address] [place], [place] [address]   Urine Culture     Status: None   Collection Time: 01/01/20  1:35 PM   Specimen: Urine, Random  Result Value Ref Range Status   Specimen Description   Final    URINE, RANDOM Performed at [facility], [address], [place], [place] [address]    Special Requests   Final  NONE Performed at [facility], [address] [place], [place] [address]    Culture   Final    NO GROWTH Performed at [facility], [place] [address] [place], [place] [address]    Report Status 01/03/2020 FINAL  Final  CULTURE, BLOOD (ROUTINE X 2) w Reflex to ID Panel     Status: None (Preliminary result)   Collection Time: 01/01/20  6:38 PM   Specimen: BLOOD  Result Value Ref Range Status   Specimen Description BLOOD BLOOD LEFT HAND  Final   Special Requests   Final    BOTTLES DRAWN AEROBIC AND ANAEROBIC Blood Culture results may not be optimal due to an inadequate volume of blood received in culture bottles   Culture   Final    NO GROWTH 2 DAYS Performed at [facility], [address], [place], [place] [address]    Report Status PENDING  Incomplete  CULTURE, BLOOD (ROUTINE X 2) w Reflex to ID Panel     Status: None (Preliminary result)   Collection Time: 01/01/20  6:40 PM   Specimen: BLOOD  Result Value Ref Range Status   Specimen Description BLOOD LEFT ANTECUBITAL  Final   Special Requests   Final    BOTTLES DRAWN AEROBIC AND ANAEROBIC Blood Culture adequate volume   Culture   Final    NO GROWTH 2 DAYS Performed at [facility], [address] [place], [place] [address]    Report Status PENDING   Incomplete    Radiology Reports CT ABDOMEN PELVIS W CONTRAST  Result Date: 12/31/2019 CLINICAL DATA:  Chronic Foley catheter in place.  Fever. EXAM: CT ABDOMEN AND PELVIS WITH CONTRAST TECHNIQUE: Multidetector CT imaging of the abdomen and pelvis was performed using the standard protocol following bolus administration of intravenous contrast. CONTRAST:  1108mL OMNIPAQUE IOHEXOL 300 MG/ML  SOLN COMPARISON:  10/17/2019 FINDINGS: Lower chest: No acute abnormality. Hepatobiliary: No focal hepatic abnormality. Gallbladder unremarkable. Pancreas: No focal abnormality or ductal dilatation. Spleen: No focal abnormality.  Normal size. Adrenals/Urinary Tract: Mild fullness of the renal collecting systems bilaterally. Foley catheter in place within the bladder which is decompressed. Adrenal glands unremarkable. Stomach/Bowel: Stomach, large and small bowel grossly unremarkable. Vascular/Lymphatic: Calcified aorta and iliac vessels. No evidence of aneurysm or adenopathy. Reproductive: 6 cm septated cyst within the left ovary, stable since prior study. Uterus and right ovary unremarkable. Other: No free fluid or free air. Musculoskeletal: Postoperative changes and degenerative changes in the lumbar spine. No acute bony abnormality. IMPRESSION: Fullness of the renal collecting systems bilaterally, similar to prior study. No stones or overt hydronephrosis. Urinary bladder decompressed. 6 cm left ovarian complex septated cyst. This was seen on prior study, measuring larger, likely related to position and scan planes. No significant change suspected. This could be further characterized with ultrasound or MRI. Aortic atherosclerosis. Electronically Signed   By: Rolm Baptise M.D.   On: 12/31/2019 23:26    SIGNED: Deatra James, MD, FACP, FHM. [facility],  Pager (please use [email or web address] to [email or web address])  If 7PM-7AM, please contact night-coverage [email or web address] 01/03/2020, 12:18 PM

## 2020-01-03 NOTE — NC FL2 (Signed)
[place] LEVEL OF CARE SCREENING TOOL     IDENTIFICATION  Patient Name: [name] Birthdate: 17-Jul-1950 Sex: female Admission Date (Current Location): 12/31/2019  [place] and [place] Number:  Engineering geologist and Address:  [facility], [address] [place], [place] [address]      Provider Number:    Attending Physician Name and Address:  Deatra James, MD  Relative Name and Phone Number:  Nikelle, Malatesta (Spouse) [phone]    Current Level of Care: Hospital Recommended Level of Care: [place] Prior Approval Number:    Date Approved/Denied:   PASRR Number:    Discharge Plan:      Current Diagnoses: Patient Active Problem List   Diagnosis Date Noted  . COPD (chronic obstructive pulmonary disease) ([place]) 01/01/2020  . Chronic indwelling Foley catheter 11/19/2019  . Neuropathy   . Hypokalemia   . Hyperlipidemia   . Sepsis secondary to UTI ([place]) 10/17/2019  . Weakness   . AKI (acute kidney injury) ([place]) 04/20/2019  . Pressure injury of skin 02/03/2019  . Bilateral lower leg cellulitis 02/02/2019  . Ovarian mass, left 01/27/2019  . Sepsis ([place]) 01/05/2019  . UTI (urinary tract infection) 06/13/2018  . Altered mental status 06/11/2018  . Fall 05/13/2018  . Depression 05/13/2018  . Recurrent cellulitis of lower extremity 06/25/2017  . Medication monitoring encounter 06/05/2017  . Cellulitis of left lower leg 04/25/2017  . Adjustment disorder with mixed disturbance of emotions and conduct 04/23/2017  . Foot pain, bilateral 02/03/2017  . Tinea pedis 02/03/2017  . Left ovarian cyst 12/09/2016  . Abdominal aortic atherosclerosis ([place]) 11/11/2016  . Swelling of lower extremity 10/11/2016  . Obstructive sleep apnea 10/11/2016  . Follicular lymphoma of intra-abdominal lymph nodes ([place]) 08/06/2016  . Multiple falls 06/07/2016  . Inguinal adenopathy 05/29/2016  . Obesity, morbid ([place]) 01/05/2016  .  Low HDL (under 40) 12/19/2015  . Cellulitis 12/18/2015  . Skin ulcer ([place]) 11/08/2015  . Peripheral vascular disease of lower extremity with ulceration ([place]) 11/08/2015  . CKD (chronic kidney disease) stage 3, GFR 30-59 ml/min 11/08/2015  . Constipation due to pain medication 01/31/2015  . Obstructive sleep apnea of adult 01/13/2015  . Pelvic muscle wasting 01/13/2015  . Incomplete bladder emptying 01/13/2015  . Headache, migraine 10/24/2014  . Neurogenic bladder 10/24/2014  . Current tobacco use 10/24/2014  . Lumbar radiculopathy, chronic 10/02/2013  . COPD with bronchial hyperresponsiveness ([place]) 10/02/2013  . Major depressive disorder, recurrent, in partial remission ([place]) 10/02/2013  . Essential hypertension 10/02/2013  . Multiple sclerosis ([place]) 10/02/2013  . Absence of bladder continence 09/25/2012  . Acontractile bladder 02/12/2012  . Narrowing of intervertebral disc space 11/26/2011  . Kyphoscoliosis and scoliosis 11/26/2011    Orientation RESPIRATION BLADDER Height & Weight     Self, Time, Situation, Place    Indwelling catheter Weight: 272 lb 8 oz (123.6 kg) Height:  5\' 4"  (162.6 cm)  BEHAVIORAL SYMPTOMS/MOOD NEUROLOGICAL BOWEL NUTRITION STATUS      Continent    AMBULATORY STATUS COMMUNICATION OF NEEDS Skin   Extensive Assist Verbally                         Personal Care Assistance Level of Assistance  Total care, Dressing, Feeding, Bathing Bathing Assistance: Maximum assistance Feeding assistance: Limited assistance Dressing Assistance: Maximum assistance Total Care Assistance: Maximum assistance   Functional Limitations Info  SPECIAL CARE FACTORS FREQUENCY  PT (By licensed PT), OT (By licensed OT)     PT Frequency: 5 X Weekly OT Frequency: 5 X Weekly            Contractures      Additional Factors Info  Code Status Code Status Info: Full             Current Medications (01/03/2020):  This is the current hospital active  medication list Current Facility-Administered Medications  Medication Dose Route Frequency Provider Last Rate Last Admin  . acetaminophen (TYLENOL) tablet 1,000 mg  1,000 mg Oral Q8H PRN Chotiner, Yevonne Aline, MD   1,000 mg at 01/02/20 0805  . acidophilus (RISAQUAD) capsule 1 capsule  1 capsule Oral TID [name] A, MD   1 capsule at 01/02/20 0857  . atorvastatin (LIPITOR) tablet 10 mg  10 mg Oral Daily Chotiner, Yevonne Aline, MD   10 mg at 01/03/20 1015  . ceFEPIme (MAXIPIME) 2 g in sodium chloride 0.9 % 100 mL IVPB  2 g Intravenous Q12H Rauer, Samantha O, RPH 200 mL/hr at 01/03/20 0119 2 g at 01/03/20 0119  . Chlorhexidine Gluconate Cloth 2 % PADS 6 each  6 each Topical Daily Deatra James, MD   6 each at 01/03/20 1023  . clonazePAM (KLONOPIN) tablet 0.5 mg  0.5 mg Oral QHS Lang Snow, NP   0.5 mg at 01/02/20 2136  . DULoxetine (CYMBALTA) DR capsule 60 mg  60 mg Oral BH-q7a Chotiner, Yevonne Aline, MD   60 mg at 01/03/20 1015  . enoxaparin (LOVENOX) injection 40 mg  40 mg Subcutaneous Q12H Chotiner, Yevonne Aline, MD   40 mg at 01/03/20 1016  . gabapentin (NEURONTIN) capsule 600 mg  600 mg Oral TID Chotiner, Yevonne Aline, MD   600 mg at 01/03/20 1014  . ibuprofen (ADVIL) tablet 600 mg  600 mg Oral Q6H PRN Shahmehdi, Seyed A, MD      . ipratropium-albuterol (DUONEB) 0.5-2.5 (3) MG/3ML nebulizer solution 3 mL  3 mL Nebulization Q6H PRN Chotiner, Yevonne Aline, MD   3 mL at 01/02/20 0827  . mirabegron ER (MYRBETRIQ) tablet 50 mg  50 mg Oral Daily Chotiner, Yevonne Aline, MD   50 mg at 01/03/20 1015  . potassium chloride SA (KLOR-CON) CR tablet 20 mEq  20 mEq Oral BID Shahmehdi, Seyed A, MD   20 mEq at 01/03/20 1015  . promethazine (PHENERGAN) tablet 12.5 mg  12.5 mg Oral Q6H PRN Chotiner, Yevonne Aline, MD      . QUEtiapine (SEROQUEL XR) 24 hr tablet 150 mg  150 mg Oral QHS Lang Snow, NP   150 mg at 01/02/20 2136  . senna-docusate (Senokot-S) tablet 1 tablet  1 tablet Oral QHS PRN  Chotiner, Yevonne Aline, MD   1 tablet at 01/01/20 2136  . Teriflunomide TABS 14 mg  14 mg Oral Daily Chotiner, Yevonne Aline, MD      . vitamin B-12 (CYANOCOBALAMIN) tablet 1,000 mcg  1,000 mcg Oral Daily Chotiner, Yevonne Aline, MD   1,000 mcg at 01/03/20 1014  . vortioxetine HBr (TRINTELLIX) tablet 5 mg  5 mg Oral Daily Chotiner, Yevonne Aline, MD   5 mg at 01/03/20 1015   Facility-Administered Medications Ordered in Other Encounters  Medication Dose Route Frequency Provider Last Rate Last Admin  . heparin lock flush 100 unit/mL  500 Units Intracatheter Once PRN Sindy Guadeloupe, MD         Discharge Medications: Please see discharge  summary for a list of discharge medications.  Relevant Imaging Results:  Relevant Lab Results:   Additional Information SS# [id number]  Meriel Flavors, LCSW

## 2020-01-04 LAB — CBC WITH DIFFERENTIAL/PLATELET
Abs Immature Granulocytes: 0.01 10*3/uL (ref 0.00–0.07)
Basophils Absolute: 0.1 10*3/uL (ref 0.0–0.1)
Basophils Relative: 1 %
Eosinophils Absolute: 0.2 10*3/uL (ref 0.0–0.5)
Eosinophils Relative: 5 %
HCT: 29.5 % — ABNORMAL LOW (ref 36.0–46.0)
Hemoglobin: 10.1 g/dL — ABNORMAL LOW (ref 12.0–15.0)
Immature Granulocytes: 0 %
Lymphocytes Relative: 17 %
Lymphs Abs: 0.7 10*3/uL (ref 0.7–4.0)
MCH: 29 pg (ref 26.0–34.0)
MCHC: 34.2 g/dL (ref 30.0–36.0)
MCV: 84.8 fL (ref 80.0–100.0)
Monocytes Absolute: 0.7 10*3/uL (ref 0.1–1.0)
Monocytes Relative: 16 %
Neutro Abs: 2.4 10*3/uL (ref 1.7–7.7)
Neutrophils Relative %: 61 %
Platelets: 148 10*3/uL — ABNORMAL LOW (ref 150–400)
RBC: 3.48 MIL/uL — ABNORMAL LOW (ref 3.87–5.11)
RDW: 14.7 % (ref 11.5–15.5)
WBC: 4 10*3/uL (ref 4.0–10.5)
nRBC: 0 % (ref 0.0–0.2)

## 2020-01-04 LAB — BASIC METABOLIC PANEL
Anion gap: 10 (ref 5–15)
BUN: 14 mg/dL (ref 8–23)
CO2: 23 mmol/L (ref 22–32)
Calcium: 8.5 mg/dL — ABNORMAL LOW (ref 8.9–10.3)
Chloride: 105 mmol/L (ref 98–111)
Creatinine, Ser: 1.15 mg/dL — ABNORMAL HIGH (ref 0.44–1.00)
GFR calc Af Amer: 56 mL/min — ABNORMAL LOW (ref >60)
GFR calc non Af Amer: 49 mL/min — ABNORMAL LOW (ref >60)
Glucose, Bld: 96 mg/dL (ref 70–99)
Potassium: 3.2 mmol/L — ABNORMAL LOW (ref 3.5–5.1)
Sodium: 138 mmol/L (ref 135–145)

## 2020-01-04 LAB — GLUCOSE, CAPILLARY
Glucose-Capillary: 105 mg/dL — ABNORMAL HIGH (ref 70–99)
Glucose-Capillary: 108 mg/dL — ABNORMAL HIGH (ref 70–99)

## 2020-01-04 MED ORDER — AMLODIPINE BESYLATE 10 MG PO TABS
10.0000 mg | ORAL_TABLET | Freq: Every day | ORAL | Status: DC
  Administered 2020-01-04 – 2020-01-06 (×3): 10 mg via ORAL
  Filled 2020-01-04 (×3): qty 1

## 2020-01-04 NOTE — Plan of Care (Signed)
  Problem: Health Behavior/Discharge Planning: Goal: Ability to manage health-related needs will improve Outcome: Progressing   

## 2020-01-04 NOTE — Progress Notes (Signed)
PT Cancellation Note  Patient Details Name: MARCINA KINNISON MRN: [id number] DOB: 10-10-1950   Cancelled Treatment:    Reason Eval/Treat Not Completed: Other (comment)  Pt self-reports unable to control bowel and is experiencing headache currently.  Pt refuses therapy and requests to be seen tomorrow.  PT will re-attempt to treat pt when she is agreeable to therapy.   Gwenlyn Saran, PT, DPT 01/04/20, 4:52 PM

## 2020-01-04 NOTE — Progress Notes (Signed)
PROGRESS NOTE    Patient: [name]                            PCP: Kirk Ruths, MD                    DOB: 03-09-1951            DOA: 12/31/2019 OZY:[id number]             DOS: 01/04/2020, 1:56 PM   LOS: 3 days   Date of Service: The patient was seen and examined on 01/04/2020  Subjective:   The patient was seen and examined this morning, stable no acute distress Denies any chest pain or shortness of breath. No issues overnight Hemodynamically stable  Brief Narrative:   Per HPI:  [name] is a 69 y.o. female with medical history significant for COPD, hypertension, multiple sclerosis, depression/anxiety, neurogenic bladder, chronic indwelling Foley catheter, morbid obesity.  She presents for evaluation of fever that began a day before admission.     She reports she had a temperature to 102 degrees at home and she will take Tylenol for it.  She reports a sudden cloudy urine in her Foley catheter bag.  She wears a chronic Foley catheter secondary to neurogenic bladder.    Patient denies tobacco and alcohol use.  She states that she does smoke marijuana every 3 to 4 days.  ED Course:  Patient is found to have a UTI on urinalysis.  She was given dose of Rocephin. Her lactic acid level was normal at 1.3 and she was hemodynamically stable   Assessment & Plan:   Principal Problem:   Sepsis ([place]) Active Problems:   UTI (urinary tract infection)   Essential hypertension   Neurogenic bladder   Obesity, morbid (HCC)   AKI (acute kidney injury) ([place])   Weakness   COPD (chronic obstructive pulmonary disease) (HCC)    Sepsis -likely due to urinary tract infection-chronic indwelling cath -Much improved sepsis/septic physiology resolved -Hemodynamically stable  -Sepsis protocol was initiated 01/02/2020 aggressive fluid resuscitation with LR Antibiotic was switched from Rocephin, Zosyn to cefepime  -Blood and urine cultures  >>> no growth to date   -Patient meets  the septic criteria 01/02/20--with developing leukocytosis 14.1, elevated creatinine 1.71, Temp. of 101/65, as low as 88/53 >>> blood pressure improved, actually elevated this a.m.  -Status post Per sepsis protocol, initiating IV fluid resuscitation,, blood cultures have been obtained, urine cultures have been obtained -Broaden antibiotics from Rocephin to Zosyn -No indication for vancomycin    UTI (urinary tract infection) -Continue current antibiotics of Rocephin >>> switch to IV Zosyn >> switch to cefepime 01/03/2020 This patient met the sepsis criteria     Essential hypertension - Was Hypotensive -Blood pressure improving  -Home medication of hydralazine Norvasc lisinopril --- are on hold  -Resuming home medication of Norvasc     AKI (acute kidney injury) ([place]) -Exacerbated by dehydration, hypotension, sepsis --- continue to improve -Monitoring closely, worsening creatinine 1.39--1.74 >>1.71 >> 1.15 today  Status post IV fluid resuscitation   Severe generalized weaknesses, unable to ambulate   -Apparently at baseline ambulates with minimum assist -Now needs 3 person assist -- -consulted PT/OT -Consulted social worker for likely placement -Due to past experiences refusing SNF, open to home health     COPD (chronic obstructive pulmonary disease) ([place]) -Stable no acute distress DuoNebs as needed for shortness of breath, cough,  wheeze.    Neurogenic bladder -Seems to be chronic, stable now chronic indwelling Foley cath Continue Foley catheter.  Continue Myrbetriq    Obesity, morbid (HCC)-COPD, hypertension, multiple sclerosis, depression/anxiety, neurogenic bladder, chronic indwelling Foley catheter, morbid obesity.   Patient will follow with PCP for dietary lifestyle interventions for weight loss  Depression -Patient complained of depressed mood, but denies any suicidal homicidal ideation Patient currently on Cymbalta, Neurontin, Seroquel,  DVT prophylaxis:       [place] score elevated.  Lovenox for DVT prophylaxis Code Status:              Full code.  CODE STATUS discussed with patient she states that she would want to be resuscitated if anything would happen to her Family Communication:       Diagnosis and plan discussed with patient.  Patient verbalized understanding agrees with plan.  Further recommendation follow as clinical indicated Disposition Plan:              Patient is from:                        Home             Anticipated DC to:                   Home versus SNF             Anticipated DC date:              In 2-3 days             Anticipated DC barriers:         Discharge plan will need to be lysed after physical therapy evaluation to see if patient will need home health versus                                                              inpatient rehab  Admission status:     Inpatient     Cultures : Urine cultures 12/31/2019 >> Blood cultures 01/01/2020 >>   Antimicrobials: Rocephin D/Ced -- 01/01/2020 01/01/2020 IV Zosyn >> 01/02/2020 Per sepsis bundle recommendation cefepime initiated 01/02/2020 >>  Consultants:  PT/OT    Procedures:   No admission procedures for hospital encounter.     Antimicrobials:  Anti-infectives (From admission, onward)   Start     Dose/Rate Route Frequency Ordered Stop   01/02/20 1300  ceFEPIme (MAXIPIME) 2 g in sodium chloride 0.9 % 100 mL IVPB        2 g 200 mL/hr over 30 Minutes Intravenous Every 12 hours 01/02/20 1226     01/02/20 1230  ceFEPIme (MAXIPIME) 2 g in sodium chloride 0.9 % 100 mL IVPB  Status:  Discontinued        2 g 200 mL/hr over 30 Minutes Intravenous  Once 01/02/20 1206 01/02/20 1226   01/01/20 2000  cefTRIAXone (ROCEPHIN) 1 g in sodium chloride 0.9 % 100 mL IVPB  Status:  Discontinued        1 g 200 mL/hr over 30 Minutes Intravenous Every 24 hours 01/01/20 0627 01/01/20 1652   01/01/20 1700  piperacillin-tazobactam (ZOSYN) IVPB 3.375 g  Status:  Discontinued        3.375  g 12.5  mL/hr over 240 Minutes Intravenous Every 8 hours 01/01/20 1651 01/02/20 1206   01/01/20 0030  cefTRIAXone (ROCEPHIN) 1 g in sodium chloride 0.9 % 100 mL IVPB        1 g 200 mL/hr over 30 Minutes Intravenous  Once 01/01/20 0028 01/01/20 0108   01/01/20 0000  cephALEXin (KEFLEX) 500 MG capsule        500 mg Oral 4 times daily 01/01/20 0112 01/11/20 2359       Medication:  . acidophilus  1 capsule Oral TID  . amLODipine  10 mg Oral Daily  . atorvastatin  10 mg Oral Daily  . Chlorhexidine Gluconate Cloth  6 each Topical Daily  . clonazePAM  0.5 mg Oral QHS  . DULoxetine  60 mg Oral BH-q7a  . enoxaparin (LOVENOX) injection  40 mg Subcutaneous Q12H  . gabapentin  600 mg Oral TID  . mirabegron ER  50 mg Oral Daily  . potassium chloride  20 mEq Oral BID  . QUEtiapine Fumarate  150 mg Oral QHS  . Teriflunomide  14 mg Oral Daily  . cyanocobalamin  1,000 mcg Oral Daily  . vortioxetine HBr  5 mg Oral Daily    acetaminophen, ibuprofen, ipratropium-albuterol, promethazine, senna-docusate   Objective:   Vitals:   01/03/20 0940 01/03/20 1607 01/04/20 0001 01/04/20 0804  BP:  (!) 114/54 (!) 143/70 (!) 141/59  Pulse:  82 82 71  Resp:  _0 Temp: 99.3 F (37.4 C) 98.6 F (37 C) 98.4 F (36.9 C) 98.6 F (37 C)  TempSrc: Oral Oral Oral Oral  SpO2:  95% 91% 96%  Weight:      Height:        Intake/Output Summary (Last 24 hours) at 01/04/2020 1356 Last data filed at 01/04/2020 1245 Gross per 24 hour  Intake 560 ml  Output 2850 ml  Net -2290 ml   Filed Weights   12/31/19 1510 01/01/20 0634  Weight: 122.5 kg 123.6 kg     Examination:       Physical Exam:   General:  Alert, oriented, cooperative, no distress;   HEENT:  Normocephalic, PERRL, otherwise with in Normal limits   Neuro:  CNII-XII intact. , normal motor and sensation, reflexes intact   Lungs:   Clear to auscultation BL, Respirations unlabored, no wheezes / crackles  Cardio:    S1/S2, RRR, No murmure,  No Rubs or Gallops   Abdomen:   Soft, non-tender, bowel sounds active all four quadrants,  no guarding or peritoneal signs.  Muscular skeletal:  Limited exam - in bed, able to move all 4 extremities, Normal strength,  2+ pulses,  symmetric, No pitting edema  Skin:  Dry, warm to touch, negative for any Rashes, No open wounds  Wounds: Please see nursing documentation Pressure Injury 10/20/19 Buttocks Left Stage 2 -  Partial thickness loss of dermis presenting as a shallow open injury with a red, pink wound bed without slough. (Active)  10/20/19 2125  Location: Buttocks  Location Orientation: Left  Staging: Stage 2 -  Partial thickness loss of dermis presenting as a shallow open injury with a red, pink wound bed without slough.  Wound Description (Comments):   Present on Admission:      Pressure Injury 10/20/19 Coccyx Mid Stage 2 -  Partial thickness loss of dermis presenting as a shallow open injury with a red, pink wound bed without slough. (Active)  10/20/19 2125  Location: Coccyx  Location Orientation: Mid  Staging: Stage 2 -  Partial thickness loss of dermis presenting as a shallow open injury with a red, pink wound bed without slough.  Wound Description (Comments):   Present on Admission:             ------------------------------------------------------------------------------------------------------------------------------------------    LABs:  CBC Latest Ref Rng & Units 01/04/2020 01/03/2020 01/02/2020  WBC 4.0 - 10.5 K/uL 4.0 6.4 9.8  Hemoglobin 12.0 - 15.0 g/dL 10.1(L) 10.8(L) 10.4(L)  Hematocrit 36 - 46 % 29.5(L) 31.9(L) 31.9(L)  Platelets 150 - 400 K/uL 148(L) 139(L) 140(L)   CMP Latest Ref Rng & Units 01/04/2020 01/03/2020 01/02/2020  Glucose 70 - 99 mg/dL 96 112(H) 105(H)  BUN 8 - 23 mg/dL _0 Creatinine 0.44 - 1.00 mg/dL 1.15(H) 1.37(H) 1.71(H)  Sodium 135 - 145 mmol/L 138 136 134(L)  Potassium 3.5 - 5.1 mmol/L 3.2(L) 3.3(L) 3.4(L)  Chloride 98 - 111 mmol/L 105 104  102  CO2 22 - 32 mmol/L _1 Calcium 8.9 - 10.3 mg/dL 8.5(L) 8.5(L) 8.6(L)  Total Protein 6.5 - 8.1 g/dL - - -  Total Bilirubin 0.3 - 1.2 mg/dL - - -  Alkaline Phos 38 - 126 U/L - - -  AST 15 - 41 U/L - - -  ALT 0 - 44 U/L - - -       Micro Results Recent Results (from the past 240 hour(s))  SARS Coronavirus 2 by RT PCR (hospital order, performed in [facility] hospital lab) Nasopharyngeal     Status: None   Collection Time: 12/31/19 10:18 PM   Specimen: Nasopharyngeal  Result Value Ref Range Status   SARS Coronavirus 2 NEGATIVE NEGATIVE Final    Comment: (NOTE) SARS-CoV-2 target nucleic acids are NOT DETECTED.  The SARS-CoV-2 RNA is generally detectable in upper and lower respiratory specimens during the acute phase of infection. The lowest concentration of SARS-CoV-2 viral copies this assay can detect is 250 copies / mL. A negative result does not preclude SARS-CoV-2 infection and should not be used as the sole basis for treatment or other patient management decisions.  A negative result may occur with improper specimen collection / handling, submission of specimen other than nasopharyngeal swab, presence of viral mutation(s) within the areas targeted by this assay, and inadequate number of viral copies (<250 copies / mL). A negative result must be combined with clinical observations, patient history, and epidemiological information.  Fact Sheet for Patients:   StrictlyIdeas.no  Fact Sheet for Healthcare Providers: BankingDealers.co.za  This test is not yet approved or  cleared by the [place] FDA and has been authorized for detection and/or diagnosis of SARS-CoV-2 by FDA under an Emergency Use Authorization (EUA).  This EUA will remain in effect (meaning this test can be used) for the duration of the COVID-19 declaration under Section 564(b)(1) of the Act, 21 U.S.C. section 360bbb-3(b)(1), unless the  authorization is terminated or revoked sooner.  Performed at [facility], [address] [place], [place] [address]   Urine Culture     Status: None   Collection Time: 01/01/20  1:35 PM   Specimen: Urine, Random  Result Value Ref Range Status   Specimen Description   Final    URINE, RANDOM Performed at [facility], [address], [place], [place] [address]    Special Requests   Final    NONE Performed at [facility], [address] [place], [place] [address]    Culture   Final    NO GROWTH Performed  at [facility], [place] [address], [place], [place] [address]    Report Status 01/03/2020 FINAL  Final  CULTURE, BLOOD (ROUTINE X 2) w Reflex to ID Panel     Status: None (Preliminary result)   Collection Time: 01/01/20  6:38 PM   Specimen: BLOOD  Result Value Ref Range Status   Specimen Description BLOOD BLOOD LEFT HAND  Final   Special Requests   Final    BOTTLES DRAWN AEROBIC AND ANAEROBIC Blood Culture results may not be optimal due to an inadequate volume of blood received in culture bottles   Culture   Final    NO GROWTH 3 DAYS Performed at [facility], [address] [place], [place] [address]    Report Status PENDING  Incomplete  CULTURE, BLOOD (ROUTINE X 2) w Reflex to ID Panel     Status: None (Preliminary result)   Collection Time: 01/01/20  6:40 PM   Specimen: BLOOD  Result Value Ref Range Status   Specimen Description BLOOD LEFT ANTECUBITAL  Final   Special Requests   Final    BOTTLES DRAWN AEROBIC AND ANAEROBIC Blood Culture adequate volume   Culture   Final    NO GROWTH 3 DAYS Performed at [facility], [address], [place], [place] [address]    Report Status PENDING  Incomplete    Radiology Reports CT ABDOMEN PELVIS W CONTRAST  Result Date: 12/31/2019 CLINICAL DATA:  Chronic Foley catheter in place.  Fever. EXAM: CT ABDOMEN AND PELVIS WITH CONTRAST TECHNIQUE: Multidetector CT imaging of the  abdomen and pelvis was performed using the standard protocol following bolus administration of intravenous contrast. CONTRAST:  173m OMNIPAQUE IOHEXOL 300 MG/ML  SOLN COMPARISON:  10/17/2019 FINDINGS: Lower chest: No acute abnormality. Hepatobiliary: No focal hepatic abnormality. Gallbladder unremarkable. Pancreas: No focal abnormality or ductal dilatation. Spleen: No focal abnormality.  Normal size. Adrenals/Urinary Tract: Mild fullness of the renal collecting systems bilaterally. Foley catheter in place within the bladder which is decompressed. Adrenal glands unremarkable. Stomach/Bowel: Stomach, large and small bowel grossly unremarkable. Vascular/Lymphatic: Calcified aorta and iliac vessels. No evidence of aneurysm or adenopathy. Reproductive: 6 cm septated cyst within the left ovary, stable since prior study. Uterus and right ovary unremarkable. Other: No free fluid or free air. Musculoskeletal: Postoperative changes and degenerative changes in the lumbar spine. No acute bony abnormality. IMPRESSION: Fullness of the renal collecting systems bilaterally, similar to prior study. No stones or overt hydronephrosis. Urinary bladder decompressed. 6 cm left ovarian complex septated cyst. This was seen on prior study, measuring larger, likely related to position and scan planes. No significant change suspected. This could be further characterized with ultrasound or MRI. Aortic atherosclerosis. Electronically Signed   By: KRolm BaptiseM.D.   On: 12/31/2019 23:26    SIGNED: SDeatra James MD, FACP, FHM. [facility],  Pager (please use [email or web address] to page/text)  If 7PM-7AM, please contact night-coverage [email or web address] [email or web address] Surgical Center LLC9/10/2019, 1:56 PM

## 2020-01-04 NOTE — TOC Progression Note (Signed)
Transition of Care [facility]) - Progression Note    Patient Details  Name: [name] MRN: [id number] Date of Birth: Jul 22, 1950  Transition of [facility] CM/SW Contact  Shelbie Ammons, RN Phone Number: 01/04/2020, 2:26 PM  Clinical Narrative:   RNCM met with patient at bedside to discuss bed offers. Patient reports that she lives at home with her husband and currently has services through [facility] but reports this is only PT and OT. Patient reports that she does not currently have any assistance at home for her ADLs. She reports that she has been to skilled facilities before. Discussed with patient that at this time the only bed offers she has offered is from [facility] in [place]. Patient reports that she will need to discuss this with her husband and get back with decision.      Expected Discharge Plan: [place] Barriers to Discharge: Continued Medical Work up  Expected Discharge Plan and Services Expected Discharge Plan: [place]   Discharge Planning Services: CM Consult Post Acute Care Choice: [place] Living arrangements for the past 2 months: Single Family Home                                       Social Determinants of Health (SDOH) Interventions    Readmission Risk Interventions Readmission Risk Prevention Plan 01/03/2020 04/22/2019 02/04/2019  Transportation Screening Complete Complete -  PCP or Specialist Appt within 3-5 Days - - -  [place] or [place] - - Complete  Social Work Consult for [place] Planning/Counseling - - Complete  Palliative Care Screening - - Not Applicable  Medication Review Press photographer) Complete Referral to Pharmacy -  PCP or Specialist appointment within 3-5 days of discharge Complete Complete -  [place] or Home Care Consult Complete Complete -  SW Recovery Care/Counseling Consult Complete Complete -  Palliative Care Screening Not Applicable Not Applicable -  [place] - Complete -  Some recent data might be hidden

## 2020-01-04 NOTE — Progress Notes (Signed)
PT Cancellation Note  Patient Details Name: [name] MRN: [id number] DOB: 1950-05-08   Cancelled Treatment:    Reason Eval/Treat Not Completed: Other (comment) Pt denied therapy due to having a bowel movement in the bed and not being cleaned.  Pt refused and requested nursing staff to assist with cleaning.  Pt will be seen again at later time per pt request.    Gwenlyn Saran, PT, DPT 01/04/20, 12:09 PM

## 2020-01-04 NOTE — Care Management Important Message (Signed)
Important Message  Patient Details  Name: [name] MRN: [id number] Date of Birth: 22-Dec-1950   Medicare Important Message Given:  Yes     Dannette Barbara 01/04/2020, 11:51 AM

## 2020-01-05 DIAGNOSIS — R5381 Other malaise: Secondary | ICD-10-CM

## 2020-01-05 LAB — BASIC METABOLIC PANEL
Anion gap: 5 (ref 5–15)
BUN: 13 mg/dL (ref 8–23)
CO2: 27 mmol/L (ref 22–32)
Calcium: 8.7 mg/dL — ABNORMAL LOW (ref 8.9–10.3)
Chloride: 106 mmol/L (ref 98–111)
Creatinine, Ser: 0.93 mg/dL (ref 0.44–1.00)
GFR calc Af Amer: >60 mL/min (ref >60)
GFR calc non Af Amer: >60 mL/min (ref >60)
Glucose, Bld: 102 mg/dL — ABNORMAL HIGH (ref 70–99)
Potassium: 3.5 mmol/L (ref 3.5–5.1)
Sodium: 138 mmol/L (ref 135–145)

## 2020-01-05 LAB — CBC WITH DIFFERENTIAL/PLATELET
Abs Immature Granulocytes: 0.01 10*3/uL (ref 0.00–0.07)
Basophils Absolute: 0.1 10*3/uL (ref 0.0–0.1)
Basophils Relative: 2 %
Eosinophils Absolute: 0.3 10*3/uL (ref 0.0–0.5)
Eosinophils Relative: 8 %
HCT: 32 % — ABNORMAL LOW (ref 36.0–46.0)
Hemoglobin: 10.5 g/dL — ABNORMAL LOW (ref 12.0–15.0)
Immature Granulocytes: 0 %
Lymphocytes Relative: 19 %
Lymphs Abs: 0.7 10*3/uL (ref 0.7–4.0)
MCH: 28.6 pg (ref 26.0–34.0)
MCHC: 32.8 g/dL (ref 30.0–36.0)
MCV: 87.2 fL (ref 80.0–100.0)
Monocytes Absolute: 0.5 10*3/uL (ref 0.1–1.0)
Monocytes Relative: 14 %
Neutro Abs: 2 10*3/uL (ref 1.7–7.7)
Neutrophils Relative %: 57 %
Platelets: 157 10*3/uL (ref 150–400)
RBC: 3.67 MIL/uL — ABNORMAL LOW (ref 3.87–5.11)
RDW: 14.9 % (ref 11.5–15.5)
WBC: 3.5 10*3/uL — ABNORMAL LOW (ref 4.0–10.5)
nRBC: 0 % (ref 0.0–0.2)

## 2020-01-05 MED ORDER — BUTALBITAL-APAP-CAFFEINE 50-325-40 MG PO TABS
1.0000 | ORAL_TABLET | Freq: Four times a day (QID) | ORAL | Status: DC | PRN
  Administered 2020-01-05: 1 via ORAL

## 2020-01-05 MED ORDER — POTASSIUM CHLORIDE 20 MEQ PO PACK
40.0000 meq | PACK | Freq: Once | ORAL | Status: AC
  Administered 2020-01-05: 40 meq via ORAL
  Filled 2020-01-05: qty 2

## 2020-01-05 NOTE — Progress Notes (Signed)
Physical Therapy Treatment Patient Details Name: [name] MRN: [id number] DOB: July 14, 1950 Today's Date: 01/05/2020    History of Present Illness [name] is a 69 y.o. female with medical history significant for COPD, hypertension, multiple sclerosis, depression/anxiety, neurogenic bladder, chronic indwelling Foley catheter, morbid obesity.  She presented 12/31/19 at ED for evaluation of fever. Patent was about to be taken home by EMS on 01/01/20 and was unable to get from bed to stretcher with 3 person assist and found to have fever. Patient stating she has not had a good experience in SNF in the past, prefer home health.    PT Comments    Pt was long sitting in bed upon arriving. She agrees to PT session and is cooperative. Required extensive conversation about safe DC disposition. After long discussion, pt did agree to go to rehab to improve safe functional mobility and improve independence. She required min assist to roll R to short sit and min assist to stand from elevated bed height. Did ambulate 12 ft with RW but is very unsteady on feet. She was seated in recliner at conclusion of session with call bell in reach and RN aware of pt's abilities. Highly recommend DC to SNF to address deficits and improve safe functional mobility.     Follow Up Recommendations  SNF     Equipment Recommendations  Rolling walker with 5" wheels    Recommendations for Other Services       Precautions / Restrictions Precautions Precautions: Fall Restrictions Weight Bearing Restrictions: No    Mobility  Bed Mobility Overal bed mobility: Needs Assistance Bed Mobility: Rolling;Supine to Sit;Sit to Supine Rolling: Min assist   Supine to sit: Min assist     General bed mobility comments: Min assist to achieve EOB short sit.  Sat up on L side of bed  Transfers Overall transfer level: Needs assistance Equipment used: Rolling walker (2 wheeled) Transfers: Sit to/from Stand Sit to Stand: Min  assist;From elevated surface         General transfer comment: Min assist to STS from elevated bed height. min-mod from lower recliner surface  Ambulation/Gait Ambulation/Gait assistance: Min assist Gait Distance (Feet): 12 Feet Assistive device: Rolling walker (2 wheeled) Gait Pattern/deviations: Trunk flexed;Shuffle;Step-through pattern Gait velocity: decreased   General Gait Details: pt ambulated 12 ft with RW with poor gait quality. she needs constant vcs for improved safety. pt is at high fall risk.        Balance Overall balance assessment: Needs assistance Sitting-balance support: Feet supported;Bilateral upper extremity supported Sitting balance-Leahy Scale: Good Sitting balance - Comments: no LOB in sitting   Standing balance support: Bilateral upper extremity supported;During functional activity Standing balance-Leahy Scale: Fair Standing balance comment: heavy reliance on RW. balance got worse as pt fatigued         Cognition Arousal/Alertness: Awake/alert Behavior During Therapy: Flat affect Overall Cognitive Status: No family/caregiver present to determine baseline cognitive functioning      General Comments: Pt is alert and oriented x3. poor insight of safety deficits. does follow commands throughout consistently             Pertinent Vitals/Pain Pain Assessment: No/denies pain Pain Score: 0-No pain Pain Location: headache Pain Intervention(s): Limited activity within patient's tolerance;Monitored during session;Repositioned;Premedicated before session           PT Goals (current goals can now be found in the care plan section) Acute Rehab PT Goals Patient Stated Goal: go home Progress towards PT goals: Progressing  toward goals    Frequency    Min 2X/week      PT Plan Current plan remains appropriate       AM-PAC PT "6 Clicks" Mobility   Outcome Measure  Help needed turning from your back to your side while in a flat bed without  using bedrails?: A Little Help needed moving from lying on your back to sitting on the side of a flat bed without using bedrails?: A Little Help needed moving to and from a bed to a chair (including a wheelchair)?: A Lot Help needed standing up from a chair using your arms (e.g., wheelchair or bedside chair)?: A Lot Help needed to walk in hospital room?: A Lot Help needed climbing 3-5 steps with a railing? : Total 6 Click Score: 13    End of Session Equipment Utilized During Treatment: Gait belt Activity Tolerance: Patient limited by fatigue Patient left: in bed;with call bell/phone within reach;with bed alarm set Nurse Communication: Mobility status PT Visit Diagnosis: Difficulty in walking, not elsewhere classified (R26.2);Muscle weakness (generalized) (M62.81)     Time: 2197-5883 PT Time Calculation (min) (ACUTE ONLY): 23 min  Charges:  $Therapeutic Activity: 23-37 mins                     Julaine Fusi PTA 01/05/20, 1:04 PM

## 2020-01-05 NOTE — Progress Notes (Signed)
Pharmacy Antibiotic Note  [name] is a 69 y.o. female admitted on 12/31/2019 with sepsis secondary to UTI?/sepsis. Patient has PMH for neurogenic bladder and chronic indwelling Foley catheter. Patient was initially on CTX then broadened to Zosyn.Now Pharmacy has been consulted for Cefepime dosing.  Plan: Day 5- Cefepime IV 2g q12h All Cultures are negative to date.    Height: 5\' 4"  (162.6 cm) Weight: 123.6 kg (272 lb 8 oz) IBW/kg (Calculated) : 54.7  Temp (24hrs), Avg:98 F (36.7 C), Min:97.7 F (36.5 C), Max:98.3 F (36.8 C)  Recent Labs  Lab 12/31/19 1524 12/31/19 1524 01/01/20 0951 01/02/20 0324 01/02/20 0820 01/02/20 1049 01/03/20 0315 01/04/20 0424 01/05/20 0511  WBC 9.6   < > 14.1* 9.8  --   --  6.4 4.0 3.5*  CREATININE 1.39*   < > 1.74* 1.71*  --   --  1.37* 1.15* 0.93  LATICACIDVEN 1.3  --   --   --  0.8 1.7  --   --   --    < > = values in this interval not displayed.    Estimated Creatinine Clearance: 74.2 mL/min (by C-G formula based on SCr of 0.93 mg/dL).    No Known Allergies  Antimicrobials this admission: 9/4 CTX x1 9/4 Zosyn >> 9/5 9/5 Cefepime >>   Microbiology results: 9/4 BCx: NG  9/4 UCx: NG  Thank you for allowing pharmacy to be a part of this patient's care.  Chinita Greenland PharmD Clinical Pharmacist 01/05/2020 1:34 PM

## 2020-01-05 NOTE — Progress Notes (Signed)
PROGRESS NOTE    [name]  XNA:[id number] DOB: 1950/06/08 DOA: 12/31/2019 PCP: Kirk Ruths, MD   Brief Narrative:  HPI per Dr. Harrold Donath on 01/01/20 [name] is a 69 y.o. female with medical history significant for COPD, hypertension, multiple sclerosis, depression/anxiety, neurogenic bladder, chronic indwelling Foley catheter, morbid obesity.  She presents for evaluation of fever that began yesterday afternoon.  She reports she had a temperature to 102 degrees at home and she will take Tylenol for it.  She reports a sudden cloudy urine in her Foley catheter bag.  She wears a chronic Foley catheter secondary to neurogenic bladder.  She reports she has not had any nausea or vomiting.  She has not had any diarrhea.  She is not able to tell if she has any dysuria or urinary frequency secondary to the chronic Foley catheter.  He has history of having UTIs in the past.  She reports she has had generalized weakness since yesterday afternoon and she normally ambulates with a walker at home but she was too weak to do that. She states she has not had any chest pain or palpitations.  She denies any syncope. Patient denies tobacco and alcohol use.  She states that she does smoke marijuana every 3 to 4 days.  ED Course: In the emergency room patient is found to have a UTI on urinalysis.  She was given dose of Rocephin.  Patient states she wanted to go home and be treated as an outpatient.  Her lactic acid level was normal at 1.3 and she was hemodynamically stable.  They attempted to ambulate her in the emergency room and she was not able to stand up and ambulate even with help of 3 nurses therefore was not safe to discharge her home and hospital service was asked to evaluate and admit patient for further management  **Interim History  Was treated for her urinary tract infection and she is much improved.  PT OT recommending skilled nursing facility and patient is now agreeable.  Her lactic  acid has normalized and she is hemodynamically stable and her only complaint today was a headache which is improved with her Tylenol and other medications.  Currently she is medically stable to be discharged to skilled nursing facility but will need insurance authorization.  If she receives it today she will be discharged today if not she will be discharged in the morning  Assessment & Plan:   Principal Problem:   Sepsis ([place]) Active Problems:   Essential hypertension   Neurogenic bladder   Obesity, morbid ([place])   UTI (urinary tract infection)   AKI (acute kidney injury) ([place])   Weakness   COPD (chronic obstructive pulmonary disease) (HCC)  Sepsis 2/2 to UTI in the setting of Chronic Indwelling Foley Catheter -Met Sepsis Criteria on Admission as she had two SIRS Criteria met with a Leukocytosis of 14.1, Temperature of 102.5 and Source of Infection in the Urine -The sepsis protocol was initiated on 01/02/2020 and she is given aggressive IV fluid resuscitation with Lactated Ringer's; now that her sepsis physiology is resolved and improved her fluid has been stopped -Initially she was given IV antibiotics with IV ceftriaxone broadened to IV Zosyn and this has been changed to IV cefepime on 01/02/2020; currently no indication for vancomycin; early has received 5 days of IV antibiotics and today is day day 5 of the cefepime IV 2 mg every 12 -Leukocytosis has now resolved and her WBC is now 3.5 and improved -  Blood cultures and urine cultures were obtained -Urinalysis on admission showed turbid appearance, yellow color urine, moderate hemoglobin, large leukocytes, negative nitrites, rare bacteria, 6-10 RBCs per high-powered field, 0-5 squamous epithelial cells, and greater than 50 WBCs -Blood cultures x2 showed no growth to date at 4 days -Urine culture showed no growth -Her sepsis physiology is much improved and she is back to baseline as her Foley catheter has been changed this admission -Given her  significant generalized weakness PT and OT recommending skilled nursing facility and she is now agreeable to going to skilled nursing facility at this time. -Transition of care assisting with placement and currently she has a bed available but insurance authorization is pending  Urinary Tract Infection  -Unfortunately antibiotics were given before urine culture was obtained -Met sepsis criteria as above -Treatment as above and she is improved  Essential Hypertension  -Initially was hypotensive -Blood pressure is much improved  -Blood pressure was 124/79 next-continue monitor blood pressures per protocol -Currently at home she takes hydralazine, amlodipine, lisinopril; all were on hold but home medication of Amlodipine 10 mg po Daily was resumed  Acute Kidney Injury -In the setting of of prerenal disease and exacerbation by dehydration, hypotension and sepsis  -Continues to improve daily and BUN/creatinine went from 23/12.71 and his peak and is now 13/0.93 after IV fluid resuscitation -Avoid further nephrotoxic medications, contrast dyes, hypotension and renally adjust medications -Repeat CMP in a.m. if patient still remains hospitalized or at SNF if discharged  Severe Generalized Weakness and Deconditioning -Time patient ambulates with minimum assistance For now needing at least 3 person assistance -PT OT consulted and recommending SNF and patient was hesitant about going to skilled nursing facility but now agreeable after evaluation today -TOC consulted for assistance with placement and she does have a bed available currently today but will need insurance authorization -She is medically stable to be discharged at this time given her improvement  COPD -Currently stable and not in any acute exacerbation -Continue with DuoNeb 3 mils every 6 hours as needed for wheezing or shortness of breath -Continue to monitor respiratory status carefully and currently not requiring supplemental oxygen  via nasal cannula  Neurogenic Bladder -Chronic stable -Has a chronic indwelling Foley catheter and Foley catheter was changed this admission  -Continue with mirabegron ER 50 mg p.o. daily  Hyperlipidemia -Continue with atorvastatin 10 mg p.o. daily  Headache -Continue with acetaminophen 1000 g p.o. every 8 hours as needed for moderate pain, ibuprofen 600 mg p.o. every 6 hours as needed for fever, headache or mild pain, and have added Fioricet 1 tab every 6 hours as needed headache  Hypokalemia  -Improving  -Potassium this morning was 3.5  -Replete with p.o. KCl 40 mEQ x1 and resume her home potassium supplementation to 20 mg p.o. twice daily -Continue to monitor and replete as necessary -Repeat CMP in the a.m.  Depression and Anxiety, ADHD -She has complained of depressed mood but denies any suicidal or homicidal ideation -Continue with clonazepam 0.5 mg p.o. nightly, duloxetine 60 mg p.o. every morning, gabapentin 600 g p.o. 3 times daily, quetiapine 150 mg p.o. nightly, and Vortioexetine 5 mg po Daily  -Follow-up outpatient with her Psychiatrist  Multiple Sclerosis -Likely the cause of her neurogenic bladder -Continue with clonazepam 0.5 mg p.o. nightly, duloxetine 60 mg daily, gabapentin 600 mg p.o. 3 times daily, teriflunomide 14 mg p.o. daily and mirabegron ER 50 mils daily -Will need outpatient follow-up with neurology  Normocytic Anemia -Patient's hemoglobin/hematocrit is  stable and is 10.5/32.0 -Continue to monitor for signs and symptoms of bleeding; Currently no overt bleeding noted -Repeat CBC in the a.m. if still hospitalized or at SNF within 1 week  History of follicular B-cell lymphoma -Continue outpatient follow-up and monitoring  Morbid Obesity -Estimated body mass index is 46.77 kg/m as calculated from the following:   Height as of this encounter: _0  (1.626 m).   Weight as of this encounter: 123.6 kg. -Continued Weight Loss and Dietary Counseling   -Patient will follow with PCP for dietary lifestyle interventions for weight loss     DVT prophylaxis: Enoxaparin 40 mg sq q12h Code Status: FULL CODE Family Communication: No family present at bedside  Disposition Plan: Anticipating discharging to skilled nursing facility when bed is available and insurance authorization is obtained; initially she was hesitant and resistant to going to skilled nursing facility but now is agreeable after evaluation by physical therapy  Status is: Inpatient  Remains inpatient appropriate because:Unsafe d/c plan, IV treatments appropriate due to intensity of illness or inability to take PO and Inpatient level of care appropriate due to severity of illness   Dispo: The patient is from: Home              Anticipated d/c is to: SNF              Anticipated d/c date is: 1 day              Patient currently is medically stable to d/c.  Consultants:   None   Procedures:  None  Antimicrobials:  Anti-infectives (From admission, onward)   Start     Dose/Rate Route Frequency Ordered Stop   01/02/20 1300  ceFEPIme (MAXIPIME) 2 g in sodium chloride 0.9 % 100 mL IVPB        2 g 200 mL/hr over 30 Minutes Intravenous Every 12 hours 01/02/20 1226     01/02/20 1230  ceFEPIme (MAXIPIME) 2 g in sodium chloride 0.9 % 100 mL IVPB  Status:  Discontinued        2 g 200 mL/hr over 30 Minutes Intravenous  Once 01/02/20 1206 01/02/20 1226   01/01/20 2000  cefTRIAXone (ROCEPHIN) 1 g in sodium chloride 0.9 % 100 mL IVPB  Status:  Discontinued        1 g 200 mL/hr over 30 Minutes Intravenous Every 24 hours 01/01/20 0627 01/01/20 1652   01/01/20 1700  piperacillin-tazobactam (ZOSYN) IVPB 3.375 g  Status:  Discontinued        3.375 g 12.5 mL/hr over 240 Minutes Intravenous Every 8 hours 01/01/20 1651 01/02/20 1206   01/01/20 0030  cefTRIAXone (ROCEPHIN) 1 g in sodium chloride 0.9 % 100 mL IVPB        1 g 200 mL/hr over 30 Minutes Intravenous  Once 01/01/20 0028 01/01/20  0108   01/01/20 0000  cephALEXin (KEFLEX) 500 MG capsule        500 mg Oral 4 times daily 01/01/20 0112 01/11/20 2359      Subjective: Seen and examined at bedside and she is doing much better today.  Her only main complaint is a slight lingering headache.  No chest pain, lightheadedness or dizziness.  No nausea or vomiting.  Initially hesitant to go to skilled nursing facility and wanted to go home but after PT reevaluation she is agreeable to going to skilled nursing facility today.  Objective: Vitals:   01/04/20 0001 01/04/20 0804 01/04/20 1608 01/04/20 2347  BP: (!) 143/70 Marland Kitchen)  141/59 130/79 138/63  Pulse: 82 71 70 73  Resp: _0 Temp: 98.4 F (36.9 C) 98.6 F (37 C) 98.3 F (36.8 C) 97.7 F (36.5 C)  TempSrc: Oral Oral Oral Oral  SpO2: 91% 96% 99% 95%  Weight:      Height:        Intake/Output Summary (Last 24 hours) at 01/05/2020 5945 Last data filed at 01/05/2020 8592 Gross per 24 hour  Intake 600 ml  Output 2800 ml  Net -2200 ml   Filed Weights   12/31/19 1510 01/01/20 0634  Weight: 122.5 kg 123.6 kg   Examination: Physical Exam:  Constitutional: WN/WD morbidly obese Caucasian female currently in NAD and appears calm and comfortable  Eyes: Lids and conjunctivae normal, sclerae anicteric  ENMT: External Ears, Nose appear normal. Grossly normal hearing.  Poor dentition Neck: Appears normal, supple, no cervical masses, normal ROM, no appreciable thyromegaly; no JVD Respiratory: Diminished to auscultation bilaterally with coarse breath sounds and minimal wheezing, no appreciable rales, rhonchi or crackles. Normal respiratory effort and patient is not tachypenic. No accessory muscle use.  Unlabored Cardiovascular: RRR, no murmurs / rubs / gallops. S1 and S2 auscultated. No extremity edema.  Abdomen: Soft, non-tender, distended secondary to body habitus. Bowel sounds positive.  GU: Deferred.  Has a chronic indwelling Foley catheter in place Musculoskeletal: No  clubbing / cyanosis of digits/nails. No joint deformity upper and lower extremities. Skin: No rashes, lesions, ulcers on limited skin evaluation. No induration; Warm and dry.  Neurologic: CN 2-12 grossly intact with no focal deficits. Romberg sign and cerebellar reflexes not assessed.  Psychiatric: Normal judgment and insight. Alert and oriented x 3. Normal mood and appropriate affect.   Data Reviewed: I have personally reviewed following labs and imaging studies  CBC: Recent Labs  Lab 12/31/19 1524 12/31/19 1524 01/01/20 0951 01/02/20 0324 01/03/20 0315 01/04/20 0424 01/05/20 0511  WBC 9.6   < > 14.1* 9.8 6.4 4.0 3.5*  NEUTROABS 8.0*  --   --   --  4.8 2.4 2.0  HGB 12.0   < > 12.0 10.4* 10.8* 10.1* 10.5*  HCT 36.6   < > 36.7 31.9* 31.9* 29.5* 32.0*  MCV 87.8   < > 88.4 88.1 85.1 84.8 87.2  PLT 155   < > 170 140* 139* 148* 157   < > = values in this interval not displayed.   Basic Metabolic Panel: Recent Labs  Lab 12/31/19 1524 12/31/19 1524 01/01/20 0951 01/02/20 0324 01/03/20 0315 01/04/20 0424 01/05/20 0511  NA 133*  --   --  134* 136 138 138  K 3.8  --   --  3.4* 3.3* 3.2* 3.5  CL 98  --   --  102 104 105 106  CO2 22  --   --  _1 GLUCOSE 118*  --   --  105* 112* 96 102*  BUN 18  --   --  _2 CREATININE 1.39*   < > 1.74* 1.71* 1.37* 1.15* 0.93  CALCIUM 8.9  --   --  8.6* 8.5* 8.5* 8.7*   < > = values in this interval not displayed.   GFR: Estimated Creatinine Clearance: 74.2 mL/min (by C-G formula based on SCr of 0.93 mg/dL). Liver Function Tests: Recent Labs  Lab 12/31/19 1524  AST 16  ALT 14  ALKPHOS 70  BILITOT 0.7  PROT 7.8  ALBUMIN 4.0   No results for input(s):  LIPASE, AMYLASE in the last 168 hours. No results for input(s): AMMONIA in the last 168 hours. Coagulation Profile: Recent Labs  Lab 01/02/20 1251  INR 1.2   Cardiac Enzymes: No results for input(s): CKTOTAL, CKMB, CKMBINDEX, TROPONINI in the last 168 hours. BNP  (last 3 results) No results for input(s): PROBNP in the last 8760 hours. HbA1C: No results for input(s): HGBA1C in the last 72 hours. CBG: Recent Labs  Lab 01/04/20 0802 01/04/20 1153  GLUCAP 105* 108*   Lipid Profile: No results for input(s): CHOL, HDL, LDLCALC, TRIG, CHOLHDL, LDLDIRECT in the last 72 hours. Thyroid Function Tests: No results for input(s): TSH, T4TOTAL, FREET4, T3FREE, THYROIDAB in the last 72 hours. Anemia Panel: No results for input(s): VITAMINB12, FOLATE, FERRITIN, TIBC, IRON, RETICCTPCT in the last 72 hours. Sepsis Labs: Recent Labs  Lab 12/31/19 1524 01/02/20 0820 01/02/20 1049  LATICACIDVEN 1.3 0.8 1.7    Recent Results (from the past 240 hour(s))  SARS Coronavirus 2 by RT PCR (hospital order, performed in [facility] hospital lab) Nasopharyngeal     Status: None   Collection Time: 12/31/19 10:18 PM   Specimen: Nasopharyngeal  Result Value Ref Range Status   SARS Coronavirus 2 NEGATIVE NEGATIVE Final    Comment: (NOTE) SARS-CoV-2 target nucleic acids are NOT DETECTED.  The SARS-CoV-2 RNA is generally detectable in upper and lower respiratory specimens during the acute phase of infection. The lowest concentration of SARS-CoV-2 viral copies this assay can detect is 250 copies / mL. A negative result does not preclude SARS-CoV-2 infection and should not be used as the sole basis for treatment or other patient management decisions.  A negative result may occur with improper specimen collection / handling, submission of specimen other than nasopharyngeal swab, presence of viral mutation(s) within the areas targeted by this assay, and inadequate number of viral copies (<250 copies / mL). A negative result must be combined with clinical observations, patient history, and epidemiological information.  Fact Sheet for Patients:   StrictlyIdeas.no  Fact Sheet for Healthcare  Providers: BankingDealers.co.za  This test is not yet approved or  cleared by the [place] FDA and has been authorized for detection and/or diagnosis of SARS-CoV-2 by FDA under an Emergency Use Authorization (EUA).  This EUA will remain in effect (meaning this test can be used) for the duration of the COVID-19 declaration under Section 564(b)(1) of the Act, 21 U.S.C. section 360bbb-3(b)(1), unless the authorization is terminated or revoked sooner.  Performed at [facility], [address], [place], [place] [address]   Urine Culture     Status: None   Collection Time: 01/01/20  1:35 PM   Specimen: Urine, Random  Result Value Ref Range Status   Specimen Description   Final    URINE, RANDOM Performed at [facility], [address] [place], [place] [address]    Special Requests   Final    NONE Performed at [facility], [address] [place], [place] [address]    Culture   Final    NO GROWTH Performed at [facility], [place] [address] [place], [place] [address]    Report Status 01/03/2020 FINAL  Final  CULTURE, BLOOD (ROUTINE X 2) w Reflex to ID Panel     Status: None (Preliminary result)   Collection Time: 01/01/20  6:38 PM   Specimen: BLOOD  Result Value Ref Range Status   Specimen Description BLOOD BLOOD LEFT HAND  Final   Special Requests  Final    BOTTLES DRAWN AEROBIC AND ANAEROBIC Blood Culture results may not be optimal due to an inadequate volume of blood received in culture bottles   Culture   Final    NO GROWTH 3 DAYS Performed at [facility], [name] [place], [place] [address]    Report Status PENDING  Incomplete  CULTURE, BLOOD (ROUTINE X 2) w Reflex to ID Panel     Status: None (Preliminary result)   Collection Time: 01/01/20  6:40 PM   Specimen: BLOOD  Result Value Ref Range Status   Specimen Description BLOOD LEFT ANTECUBITAL  Final   Special Requests   Final    BOTTLES  DRAWN AEROBIC AND ANAEROBIC Blood Culture adequate volume   Culture   Final    NO GROWTH 3 DAYS Performed at [facility], [address], [place], [place] [address]    Report Status PENDING  Incomplete    RN Pressure Injury Documentation: Pressure Injury 10/20/19 Buttocks Left Stage 2 -  Partial thickness loss of dermis presenting as a shallow open injury with a red, pink wound bed without slough. (Active)  10/20/19 2125  Location: Buttocks  Location Orientation: Left  Staging: Stage 2 -  Partial thickness loss of dermis presenting as a shallow open injury with a red, pink wound bed without slough.  Wound Description (Comments):   Present on Admission:      Pressure Injury 10/20/19 Coccyx Mid Stage 2 -  Partial thickness loss of dermis presenting as a shallow open injury with a red, pink wound bed without slough. (Active)  10/20/19 2125  Location: Coccyx  Location Orientation: Mid  Staging: Stage 2 -  Partial thickness loss of dermis presenting as a shallow open injury with a red, pink wound bed without slough.  Wound Description (Comments):   Present on Admission:     Estimated body mass index is 46.77 kg/m as calculated from the following:   Height as of this encounter: _0  (1.626 m).   Weight as of this encounter: 123.6 kg.  Malnutrition Type:      Malnutrition Characteristics:      Nutrition Interventions:    Radiology Studies: No results found.  Scheduled Meds: . acidophilus  1 capsule Oral TID  . amLODipine  10 mg Oral Daily  . atorvastatin  10 mg Oral Daily  . Chlorhexidine Gluconate Cloth  6 each Topical Daily  . clonazePAM  0.5 mg Oral QHS  . DULoxetine  60 mg Oral BH-q7a  . enoxaparin (LOVENOX) injection  40 mg Subcutaneous Q12H  . gabapentin  600 mg Oral TID  . mirabegron ER  50 mg Oral Daily  . potassium chloride  40 mEq Oral Once  . potassium chloride  20 mEq Oral BID  . QUEtiapine Fumarate  150 mg Oral QHS  . Teriflunomide  14 mg  Oral Daily  . cyanocobalamin  1,000 mcg Oral Daily  . vortioxetine HBr  5 mg Oral Daily   Continuous Infusions: . ceFEPime (MAXIPIME) IV 2 g (01/05/20 0202)    LOS: 4 days   Kerney Elbe, DO [facility] PAGER is on [place]  If 7PM-7AM, please contact night-coverage [email or web address]

## 2020-01-05 NOTE — TOC Progression Note (Signed)
Transition of Care [facility]) - Progression Note    Patient Details  Name: [name] MRN: [id number] Date of Birth: 1950-11-10  Transition of Care [facility]) CM/SW Contact  Shelbie Ammons, RN Phone Number: 01/05/2020, 1:41 PM  Clinical Narrative:   Patient made decision to accept bed at [facility], bed accepted in the hub and notified Albina Billet as well. Insurance authorization started through the portal and MD notified of need for rapid Covid test.     Expected Discharge Plan: [place] Barriers to Discharge: Continued Medical Work up  Expected Discharge Plan and Services Expected Discharge Plan: [place]   Discharge Planning Services: CM Consult Post Acute Care Choice: [place] arrangements for the past 2 months: Single Family Home                                       Social Determinants of Health (SDOH) Interventions    Readmission Risk Interventions Readmission Risk Prevention Plan 01/03/2020 04/22/2019 02/04/2019  Transportation Screening Complete Complete -  PCP or Specialist Appt within 3-5 Days - - -  HRI or [place] - - Complete  Social Work Consult for [place] Planning/Counseling - - Complete  Palliative Care Screening - - Not Applicable  Medication Review Press photographer) Complete Referral to Pharmacy -  PCP or Specialist appointment within 3-5 days of discharge Complete Complete -  [place] or Home Care Consult Complete Complete -  SW Recovery Care/Counseling Consult Complete Complete -  Palliative Care Screening Not Applicable Not Applicable -  [place] - Complete -  Some recent data might be hidden

## 2020-01-05 NOTE — Plan of Care (Signed)
  Problem: Health Behavior/Discharge Planning: Goal: Ability to manage health-related needs will improve Outcome: Progressing   Problem: Clinical Measurements: Goal: Ability to maintain clinical measurements within normal limits will improve Outcome: Progressing Goal: Will remain free from infection Outcome: Progressing Goal: Diagnostic test results will improve Outcome: Progressing   Problem: Clinical Measurements: Goal: Will remain free from infection Outcome: Progressing   Problem: Clinical Measurements: Goal: Diagnostic test results will improve Outcome: Progressing   Problem: Urinary Elimination: Goal: Signs and symptoms of infection will decrease Outcome: Progressing

## 2020-01-06 LAB — COMPREHENSIVE METABOLIC PANEL WITH GFR
ALT: 25 U/L (ref 0–44)
AST: 26 U/L (ref 15–41)
Albumin: 3 g/dL — ABNORMAL LOW (ref 3.5–5.0)
Alkaline Phosphatase: 58 U/L (ref 38–126)
Anion gap: 10 (ref 5–15)
BUN: 13 mg/dL (ref 8–23)
CO2: 25 mmol/L (ref 22–32)
Calcium: 9 mg/dL (ref 8.9–10.3)
Chloride: 103 mmol/L (ref 98–111)
Creatinine, Ser: 0.91 mg/dL (ref 0.44–1.00)
GFR calc Af Amer: >60 mL/min
GFR calc non Af Amer: >60 mL/min
Glucose, Bld: 96 mg/dL (ref 70–99)
Potassium: 3.9 mmol/L (ref 3.5–5.1)
Sodium: 138 mmol/L (ref 135–145)
Total Bilirubin: 0.7 mg/dL (ref 0.3–1.2)
Total Protein: 6.3 g/dL — ABNORMAL LOW (ref 6.5–8.1)

## 2020-01-06 LAB — CBC WITH DIFFERENTIAL/PLATELET
Abs Immature Granulocytes: 0.04 K/uL (ref 0.00–0.07)
Basophils Absolute: 0 K/uL (ref 0.0–0.1)
Basophils Relative: 1 %
Eosinophils Absolute: 0.3 K/uL (ref 0.0–0.5)
Eosinophils Relative: 8 %
HCT: 31.2 % — ABNORMAL LOW (ref 36.0–46.0)
Hemoglobin: 10.3 g/dL — ABNORMAL LOW (ref 12.0–15.0)
Immature Granulocytes: 1 %
Lymphocytes Relative: 17 %
Lymphs Abs: 0.7 K/uL (ref 0.7–4.0)
MCH: 28.9 pg (ref 26.0–34.0)
MCHC: 33 g/dL (ref 30.0–36.0)
MCV: 87.4 fL (ref 80.0–100.0)
Monocytes Absolute: 0.5 K/uL (ref 0.1–1.0)
Monocytes Relative: 11 %
Neutro Abs: 2.7 K/uL (ref 1.7–7.7)
Neutrophils Relative %: 62 %
Platelets: 199 K/uL (ref 150–400)
RBC: 3.57 MIL/uL — ABNORMAL LOW (ref 3.87–5.11)
RDW: 14.7 % (ref 11.5–15.5)
WBC: 4.2 K/uL (ref 4.0–10.5)
nRBC: 0 % (ref 0.0–0.2)

## 2020-01-06 LAB — SARS CORONAVIRUS 2 BY RT PCR (HOSPITAL ORDER, PERFORMED IN ~~LOC~~ HOSPITAL LAB): SARS Coronavirus 2: NEGATIVE

## 2020-01-06 LAB — PHOSPHORUS: Phosphorus: 2.9 mg/dL (ref 2.5–4.6)

## 2020-01-06 LAB — CULTURE, BLOOD (ROUTINE X 2)
Culture: NO GROWTH
Culture: NO GROWTH
Special Requests: ADEQUATE

## 2020-01-06 LAB — MAGNESIUM: Magnesium: 1.8 mg/dL (ref 1.7–2.4)

## 2020-01-06 MED ORDER — RISAQUAD PO CAPS: 1.0000 | ORAL_CAPSULE | Freq: Three times a day (TID) | ORAL | Status: DC

## 2020-01-06 MED ORDER — LISINOPRIL 5 MG PO TABS: 5.0000 mg | ORAL_TABLET | Freq: Every day | ORAL | Status: DC

## 2020-01-06 MED ORDER — MAGNESIUM SULFATE IN D5W 1-5 GM/100ML-% IV SOLN
1.0000 g | Freq: Once | INTRAVENOUS | Status: AC
  Administered 2020-01-06: 1 g via INTRAVENOUS
  Filled 2020-01-06: qty 100

## 2020-01-06 MED ORDER — CLONAZEPAM 1 MG PO TABS
1.0000 mg | ORAL_TABLET | Freq: Two times a day (BID) | ORAL | 0 refills | Status: DC | PRN
Start: 2020-01-06 — End: 2020-02-03

## 2020-01-06 MED ORDER — HYDRALAZINE HCL 10 MG PO TABS: 10.0000 mg | ORAL_TABLET | Freq: Three times a day (TID) | ORAL | Status: DC

## 2020-01-06 NOTE — TOC Progression Note (Signed)
Transition of Care [facility]) - Progression Note    Patient Details  Name: [name] MRN: [id number] Date of Birth: 1950/06/11  Transition of Care [facility]) CM/SW Contact  Shelbie Ammons, RN Phone Number: 01/06/2020, 7:47 AM  Clinical Narrative:   RNCM received insurance auth through [place] [facility] for patient with auth ID of [id number], good through 9/10. CM reviewing for continued Josem Kaufmann will be [employer].     Expected Discharge Plan: [place] Barriers to Discharge: Continued Medical Work up  Expected Discharge Plan and Services Expected Discharge Plan: [place]   Discharge Planning Services: CM Consult Post Acute Care Choice: [place] Living arrangements for the past 2 months: Single Family Home                                       Social Determinants of Health (SDOH) Interventions    Readmission Risk Interventions Readmission Risk Prevention Plan 01/03/2020 04/22/2019 02/04/2019  Transportation Screening Complete Complete -  PCP or Specialist Appt within 3-5 Days - - -  HRI or [place] - - Complete  Social Work Consult for [place] Planning/Counseling - - Complete  Palliative Care Screening - - Not Applicable  Medication Review Press photographer) Complete Referral to Pharmacy -  PCP or Specialist appointment within 3-5 days of discharge Complete Complete -  [place] or Home Care Consult Complete Complete -  SW Recovery Care/Counseling Consult Complete Complete -  Palliative Care Screening Not Applicable Not Applicable -  [place] - Complete -  Some recent data might be hidden

## 2020-01-06 NOTE — Plan of Care (Signed)
  Problem: Health Behavior/Discharge Planning: Goal: Ability to manage health-related needs will improve Outcome: Adequate for Discharge   Problem: Clinical Measurements: Goal: Ability to maintain clinical measurements within normal limits will improve Outcome: Adequate for Discharge Goal: Will remain free from infection Outcome: Adequate for Discharge Goal: Diagnostic test results will improve Outcome: Adequate for Discharge   Problem: Urinary Elimination: Goal: Signs and symptoms of infection will decrease Outcome: Adequate for Discharge  Report called to [facility] - [facility] Lennert - pt iv removed and tx to snf via EMS - indwelling foley remained in place d/t chronic foley use .  Foley care reviewed with pt prior to dc.  Understanding voiced

## 2020-01-06 NOTE — Discharge Summary (Signed)
Physician Discharge Summary  [name] OAC:[id number] DOB: 04/24/1951 DOA: 12/31/2019  PCP: Kirk Ruths, MD  Admit date: 12/31/2019 Discharge date: 01/06/2020  Admitted From: Home Disposition: SNF  Recommendations for Outpatient Follow-up:  1. Follow up with PCP in 1-2 weeks 2. Please obtain CMP/CBC, Mag, Phos in one week 3. Please follow up on the following pending results:  Home Health: No  Equipment/Devices: Conservation officer, nature with 5" Wheels   Discharge Condition: Stable CODE STATUS: FULL CODE Diet recommendation: Heart Healthy Diet  Brief/Interim Summary: HPI per Dr. Leory Plowman Chotiner on 01/01/20 [name] a 69 y.o.femalewith medical history significant forCOPD, hypertension, multiple sclerosis, depression/anxiety, neurogenic bladder, chronic indwelling Foley catheter, morbid obesity.She presents for evaluation of fever that began yesterday afternoon. She reports she had a temperature to 102 degrees at home and she will take Tylenol for it. She reports a sudden cloudy urine in her Foley catheter bag. She wears a chronic Foley catheter secondary to neurogenic bladder.She reports she has not had any nausea or vomiting. She has not had any diarrhea. She is not able to tell if she has any dysuria or urinary frequency secondary to the chronic Foley catheter. He has history of having UTIs in the past. She reports she has had generalized weakness since yesterday afternoon and she normally ambulates with a walker at home but she was too weak to do that. She states she has not had any chest pain or palpitations. She denies any syncope. Patient denies tobacco and alcohol use. She states that she does smoke marijuana every 3 to 4 days.  ED Course:In the emergency room patient is found to have a UTI on urinalysis. She was given dose of Rocephin. Patient states she wanted to go home and be treated as an outpatient. Her lactic acid level was normal at 1.3 and she was  hemodynamically stable. They attempted to ambulate her in the emergency room and she was not able to stand up and ambulate even with help of 3 nurses therefore was not safe to discharge her home and hospital service was asked to evaluate and admit patient for further management  **Interim History  Was treated for her urinary tract infection and she is much improved.  PT OT recommending skilled nursing facility and patient is now agreeable.  Her lactic acid has normalized and she is hemodynamically stable and her only complaint today was a headache which is improved with her Tylenol and other medications.  Currently she is medically stable to be discharged to skilled nursing facility but will need insurance authorization and this happened late last evening. COVID testing is Negative for SNF. She is stable to D/C to SNF and follow up with her primary Urologist and PCP in the outpatient setting.   Discharge Diagnoses:  Principal Problem:   Sepsis ([place]) Active Problems:   Essential hypertension   Neurogenic bladder   Obesity, morbid ([place])   UTI (urinary tract infection)   AKI (acute kidney injury) ([place])   Weakness   COPD (chronic obstructive pulmonary disease) ([place])  Sepsis 2/2 to UTI in the setting of Chronic Indwelling Foley Catheter, improved  -Met Sepsis Criteria on Admission as she had two SIRS Criteria met with a Leukocytosis of 14.1, Temperature of 102.5 and Source of Infection in the Urine -The sepsis protocol was initiated on 01/02/2020 and she is given aggressive IV fluid resuscitation with Lactated Ringer's; now that her sepsis physiology is resolved and improved her fluid has been stopped -Initially she was given  IV antibiotics with IV ceftriaxone broadened to IV Zosyn for 1 day  and this has been changed to IV cefepime on 01/02/2020; currently no indication for vancomycin; early has received 5 days of IV antibiotics and she has had 5 Days of the cefepime IV 2 mg every 12 -Leukocytosis has  now resolved and her WBC is now 4.2 and improved; Does not need any mor Abx currently and will not D/C with Abx to SNF -Blood cultures and urine cultures were obtained -Urinalysis on admission showed turbid appearance, yellow color urine, moderate hemoglobin, large leukocytes, negative nitrites, rare bacteria, 6-10 RBCs per high-powered field, 0-5 squamous epithelial cells, and greater than 50 WBCs -Blood cultures x2 showed no growth to date at 5 days -Urine culture showed no growth -Her sepsis physiology is much improved and she is back to baseline as her Foley catheter has been changed this admission -Given her significant generalized weakness PT and OT recommending skilled nursing facility and she is now agreeable to going to skilled nursing facility at this time. -Transition of care assisting with placement and currently she has a bed available but insurance authorization is obtained now   Urinary Tract Infection  -Unfortunately antibiotics were given before urine culture was obtained -Met sepsis criteria as above -Treatment as above and she is improved  Essential Hypertension  -Initially was hypotensive -Blood pressure is much improved  -Blood pressure was 118/43 early this AM  -Continue monitor blood pressures per protocol -Currently at home she takes hydralazine, amlodipine, lisinopril; all were on hold but home medication of Amlodipine was resumed but to 10 mg po Daily instead of the 5 mg po Daily; Will go back to 5 mg po Daily at D/C -Will need to resume her Home Hydralazine and Lisinopril and will reduce the dose; She takes 50 mg of Hydralazine po TID and will reduce dose to 10 mg po TID and also takes 20 mg po Lisinopril Daily and will reduce the dose to 5 mg po Daily   Acute Kidney Injury -In the setting of of prerenal disease and exacerbation by dehydration, hypotension and sepsis  -Continues to improve daily and BUN/creatinine went from 23/12.71 and his peak and is now  13/0.91 after IV fluid resuscitation -Avoid further nephrotoxic medications, contrast dyes, hypotension and renally adjust medications -Repeat CMP in a.m. if patient still remains hospitalized or at SNF if discharged  Severe Generalized Weakness and Deconditioning -Time patient ambulates with minimum assistance For now needing at least 3 person assistance -PT OT consulted and recommending SNF and patient was hesitant about going to skilled nursing facility but now agreeable after evaluation today -TOC consulted for assistance with placement and she does have a bed available currently today but will need insurance authorization -She is medically stable to be discharged at this time given her improvement  COPD -Currently stable and not in any acute exacerbation -Continue with DuoNeb 3 mL every 6 hours as needed for wheezing or shortness of breath -Continue to monitor respiratory status carefully and currently not requiring supplemental oxygen via nasal cannula  Neurogenic Bladder -Chronic stable -Has a chronic indwelling Foley catheter and Foley catheter was changed this admission  -Continue with mirabegron ER 50 mg p.o. daily -Resume home Baclofen -Follow up with Urology in the outpatient setting   Hyperlipidemia -Continue with atorvastatin 10 mg p.o. daily  Headache -Continue with acetaminophen 1000 g p.o. every 8 hours as needed for moderate pain, ibuprofen 600 mg p.o. every 6 hours as needed for fever,  headache or mild pain, and have added Fioricet 1 tab every 6 hours as needed headache  Hypokalemia  -Improving  -Potassium this morning was 3.9 -C/we her home potassium supplementation to 20 mg p.o. twice daily -Continue to monitor and replete as necessary -Repeat CMP in the a.m.  Depression and Anxiety, ADHD -She has complained of depressed mood but denies any suicidal or homicidal ideation -Continue with clonazepam 0.5 mg p.o. nightly, duloxetine 60 mg p.o. every  morning, gabapentin 600 g p.o. 3 times daily, quetiapine 150 mg p.o. nightly, and Vortioexetine 5 mg po Daily  -Follow-up outpatient with her Psychiatrist  Multiple Sclerosis -Likely the cause of her neurogenic bladder -Continue with clonazepam 0.5 mg p.o. nightly, duloxetine 60 mg daily, gabapentin 600 mg p.o. 3 times daily, teriflunomide 14 mg p.o. daily and mirabegron ER 50 mils daily -Will need outpatient follow-up with neurology -Resume Home Baclofen at D/C  Normocytic Anemia -Patient's hemoglobin/hematocrit is stable and is 10.3/31.2 today  -Continue to monitor for signs and symptoms of bleeding; Currently no overt bleeding noted -Repeat CBC at SNF within 1 week  History of follicular B-cell lymphoma -Continue outpatient follow-up and monitoring  Morbid Obesity -Estimated body mass index is 46.77 kg/m as calculated from the following:   Height as of this encounter: _0  (1.626 m).   Weight as of this encounter: 123.6 kg. -Continued Weight Loss and Dietary Counseling  -Patient will follow with PCP for dietary lifestyle interventions for weight loss   Discharge Instructions  Discharge Instructions    Call MD for:  difficulty breathing, headache or visual disturbances   Complete by: As directed    Call MD for:  extreme fatigue   Complete by: As directed    Call MD for:  hives   Complete by: As directed    Call MD for:  persistant dizziness or light-headedness   Complete by: As directed    Call MD for:  persistant nausea and vomiting   Complete by: As directed    Call MD for:  redness, tenderness, or signs of infection (pain, swelling, redness, odor or green/yellow discharge around incision site)   Complete by: As directed    Call MD for:  severe uncontrolled pain   Complete by: As directed    Call MD for:  temperature >100.4   Complete by: As directed    Diet - low sodium heart healthy   Complete by: As directed    Discharge instructions   Complete by: As  directed    You were cared for by a hospitalist during your hospital stay. If you have any questions about your discharge medications or the care you received while you were in the hospital after you are discharged, you can call the unit and ask to speak with the hospitalist on call if the hospitalist that took care of you is not available. Once you are discharged, your primary care physician will handle any further medical issues. Please note that NO REFILLS for any discharge medications will be authorized once you are discharged, as it is imperative that you return to your primary care physician (or establish a relationship with a primary care physician if you do not have one) for your aftercare needs so that they can reassess your need for medications and monitor your lab values.  Follow up with PCP, Urology, and Neurology in the outpatient setting. Take all medications as prescribed. If symptoms change or worsen please return to the ED for evaluation   Increase activity  slowly   Complete by: As directed      Allergies as of 01/06/2020   No Known Allergies     Medication List    TAKE these medications   acetaminophen 500 MG tablet Commonly known as: TYLENOL Take 1,000 mg by mouth every 8 (eight) hours as needed for moderate pain.   acidophilus Caps capsule Take 1 capsule by mouth 3 (three) times daily.   amLODipine 5 MG tablet Commonly known as: NORVASC Take 5 mg by mouth daily.   atorvastatin 10 MG tablet Commonly known as: LIPITOR Take 10 mg by mouth daily.   baclofen 10 MG tablet Commonly known as: LIORESAL Take 10 mg by mouth 3 (three) times daily.   buPROPion 300 MG 24 hr tablet Commonly known as: WELLBUTRIN XL Take 300 mg by mouth daily.   clonazePAM 1 MG tablet Commonly known as: KLONOPIN Take 1 tablet (1 mg total) by mouth 2 (two) times daily as needed.   cyanocobalamin 1000 MCG tablet Take 1,000 mcg by mouth daily.   docusate sodium 100 MG capsule Commonly  known as: COLACE Take 1 capsule (100 mg total) by mouth 2 (two) times daily as needed.   DULoxetine 60 MG capsule Commonly known as: CYMBALTA Take 1 capsule (60 mg total) by mouth every morning.   famotidine 20 MG tablet Commonly known as: PEPCID Take 1 tablet (20 mg total) by mouth daily.   gabapentin 600 MG tablet Commonly known as: NEURONTIN Take 1 tablet (600 mg total) by mouth 3 (three) times daily.   hydrALAZINE 10 MG tablet Commonly known as: APRESOLINE Take 1 tablet (10 mg total) by mouth 3 (three) times daily. What changed:   medication strength  how much to take   lisinopril 5 MG tablet Commonly known as: ZESTRIL Take 1 tablet (5 mg total) by mouth daily. What changed:   medication strength  how much to take   magnesium oxide 400 (241.3 Mg) MG tablet Commonly known as: MAG-OX Take 1 tablet (400 mg total) by mouth daily.   multivitamin with minerals Tabs tablet Take 1 tablet by mouth daily.   Myrbetriq 50 MG Tb24 tablet Generic drug: mirabegron ER TAKE ONE TABLET BY MOUTH ONCE DAILY What changed:   how much to take  how to take this  when to take this  additional instructions   polyethylene glycol powder 17 GM/SCOOP powder Commonly known as: GLYCOLAX/MIRALAX Take 17 g by mouth daily as needed for mild constipation.   QUEtiapine Fumarate 150 MG 24 hr tablet Commonly known as: SEROQUEL XR Take 150 mg by mouth at bedtime.   Teriflunomide 14 MG Tabs Take 14 mg by mouth daily.   Trintellix 5 MG Tabs tablet Generic drug: vortioxetine HBr Take 5 mg by mouth daily.       Contact information for follow-up providers    Kirk Ruths, MD.   Specialty: Internal Medicine Contact information: [place] [address] [phone]            Contact information for after-discharge care    Destination    [facility] .   Service: Skilled Nursing Contact  information: [place] [phone]                 No Known Allergies  Consultations:  None  Procedures/Studies: CT ABDOMEN PELVIS W CONTRAST  Result Date: 12/31/2019 CLINICAL DATA:  Chronic Foley catheter in place.  Fever.  EXAM: CT ABDOMEN AND PELVIS WITH CONTRAST TECHNIQUE: Multidetector CT imaging of the abdomen and pelvis was performed using the standard protocol following bolus administration of intravenous contrast. CONTRAST:  168m OMNIPAQUE IOHEXOL 300 MG/ML  SOLN COMPARISON:  10/17/2019 FINDINGS: Lower chest: No acute abnormality. Hepatobiliary: No focal hepatic abnormality. Gallbladder unremarkable. Pancreas: No focal abnormality or ductal dilatation. Spleen: No focal abnormality.  Normal size. Adrenals/Urinary Tract: Mild fullness of the renal collecting systems bilaterally. Foley catheter in place within the bladder which is decompressed. Adrenal glands unremarkable. Stomach/Bowel: Stomach, large and small bowel grossly unremarkable. Vascular/Lymphatic: Calcified aorta and iliac vessels. No evidence of aneurysm or adenopathy. Reproductive: 6 cm septated cyst within the left ovary, stable since prior study. Uterus and right ovary unremarkable. Other: No free fluid or free air. Musculoskeletal: Postoperative changes and degenerative changes in the lumbar spine. No acute bony abnormality. IMPRESSION: Fullness of the renal collecting systems bilaterally, similar to prior study. No stones or overt hydronephrosis. Urinary bladder decompressed. 6 cm left ovarian complex septated cyst. This was seen on prior study, measuring larger, likely related to position and scan planes. No significant change suspected. This could be further characterized with ultrasound or MRI. Aortic atherosclerosis. Electronically Signed   By: KRolm BaptiseM.D.   On: 12/31/2019 23:26    Subjective: Patient was seen and examined at bedside and she is doing okay.  No  nausea or vomiting.  Denies any complaints at this time.  States that she slept okay.  Feels very weak and does not want to go to SNF but understands that she has to get better and cannot go home with this.  No other concerns and ready for discharge at this time.  Discharge Exam: Vitals:   01/05/20 0750 01/06/20 0040  BP: 124/79 (!) 118/43  Pulse: 69 63  Resp: 18 18  Temp: 98.1 F (36.7 C) 97.9 F (36.6 C)  SpO2: 95% 94%   Vitals:   01/04/20 1608 01/04/20 2347 01/05/20 0750 01/06/20 0040  BP: 130/79 138/63 124/79 (!) 118/43  Pulse: 70 73 69 63  Resp: _0 Temp: 98.3 F (36.8 C) 97.7 F (36.5 C) 98.1 F (36.7 C) 97.9 F (36.6 C)  TempSrc: Oral Oral Oral Oral  SpO2: 99% 95% 95% 94%  Weight:      Height:       General: Pt is alert, awake, not in acute distress Cardiovascular: RRR, S1/S2 +, no rubs, no gallops Respiratory: Slightly diminished bilaterally, no wheezing, no rhonchi Abdominal: Soft, NT, stented secondary body habitus, bowel sounds + Extremities: no edema, no cyanosis  The results of significant diagnostics from this hospitalization (including imaging, microbiology, ancillary and laboratory) are listed below for reference.    Microbiology: Recent Results (from the past 240 hour(s))  SARS Coronavirus 2 by RT PCR (hospital [facility] Nasopharyngeal     Status: None   Collection Time: 12/31/19 10:18 PM   Specimen: Nasopharyngeal  Result Value Ref Range Status   SARS Coronavirus 2 NEGATIVE NEGATIVE Final    Comment: (NOTE) SARS-CoV-2 target nucleic acids are NOT DETECTED.  The SARS-CoV-2 RNA is generally detectable in upper and lower respiratory specimens during the acute phase of infection. The lowest concentration of SARS-CoV-2 viral copies this assay can detect is 250 copies / mL. A negative result does not preclude SARS-CoV-2 infection and should not be used as the sole basis for treatment or other patient  management decisions.  A negative result may  occur with improper specimen collection / handling, submission of specimen other than nasopharyngeal swab, presence of viral mutation(s) within the areas targeted by this assay, and inadequate number of viral copies (<250 copies / mL). A negative result must be combined with clinical observations, patient history, and epidemiological information.  Fact Sheet for Patients:   StrictlyIdeas.no  Fact Sheet for Healthcare Providers: BankingDealers.co.za  This test is not yet approved or  cleared by the [place] FDA and has been authorized for detection and/or diagnosis of SARS-CoV-2 by FDA under an Emergency Use Authorization (EUA).  This EUA will remain in effect (meaning this test can be used) for the duration of the COVID-19 declaration under Section 564(b)(1) of the Act, 21 U.S.C. section 360bbb-3(b)(1), unless the authorization is terminated or revoked sooner.  Performed at [facility], [address], [place], [place] [address]   Urine Culture     Status: None   Collection Time: 01/01/20  1:35 PM   Specimen: Urine, Random  Result Value Ref Range Status   Specimen Description   Final    URINE, RANDOM Performed at [facility], [address] [place], [place] [address]    Special Requests   Final    NONE Performed at [facility], [address] [place], [place] [address]    Culture   Final    NO GROWTH Performed at [facility], [place] [address], [place], [place] [address]    Report Status 01/03/2020 FINAL  Final  CULTURE, BLOOD (ROUTINE X 2) w Reflex to ID Panel     Status: None   Collection Time: 01/01/20  6:38 PM   Specimen: BLOOD  Result Value Ref Range Status   Specimen Description BLOOD BLOOD LEFT HAND  Final   Special Requests   Final    BOTTLES DRAWN AEROBIC AND ANAEROBIC Blood Culture results may not be optimal due to an  inadequate volume of blood received in culture bottles   Culture   Final    NO GROWTH 5 DAYS Performed at [facility], [address] [place], [place] [address]    Report Status 01/06/2020 FINAL  Final  CULTURE, BLOOD (ROUTINE X 2) w Reflex to ID Panel     Status: None   Collection Time: 01/01/20  6:40 PM   Specimen: BLOOD  Result Value Ref Range Status   Specimen Description BLOOD LEFT ANTECUBITAL  Final   Special Requests   Final    BOTTLES DRAWN AEROBIC AND ANAEROBIC Blood Culture adequate volume   Culture   Final    NO GROWTH 5 DAYS Performed at [facility], [address], [place], [place] [address]    Report Status 01/06/2020 FINAL  Final    Labs: BNP (last 3 results) No results for input(s): BNP in the last 8760 hours. Basic Metabolic Panel: Recent Labs  Lab 01/02/20 0324 01/03/20 0315 01/04/20 0424 01/05/20 0511 01/06/20 0527  NA 134* 136 138 138 138  K 3.4* 3.3* 3.2* 3.5 3.9  CL 102 104 105 106 103  CO2 _0 GLUCOSE 105* 112* 96 102* 96  BUN _1 CREATININE 1.71* 1.37* 1.15* 0.93 0.91  CALCIUM 8.6* 8.5* 8.5* 8.7* 9.0  MG  --   --   --   --  1.8  PHOS  --   --   --   --  2.9   Liver Function Tests: Recent Labs  Lab 12/31/19  1524 01/06/20 0527  AST 16 26  ALT 14 25  ALKPHOS 70 58  BILITOT 0.7 0.7  PROT 7.8 6.3*  ALBUMIN 4.0 3.0*   No results for input(s): LIPASE, AMYLASE in the last 168 hours. No results for input(s): AMMONIA in the last 168 hours. CBC: Recent Labs  Lab 12/31/19 1524 01/01/20 0951 01/02/20 0324 01/03/20 0315 01/04/20 0424 01/05/20 0511 01/06/20 0527  WBC 9.6   < > 9.8 6.4 4.0 3.5* 4.2  NEUTROABS 8.0*  --   --  4.8 2.4 2.0 2.7  HGB 12.0   < > 10.4* 10.8* 10.1* 10.5* 10.3*  HCT 36.6   < > 31.9* 31.9* 29.5* 32.0* 31.2*  MCV 87.8   < > 88.1 85.1 84.8 87.2 87.4  PLT 155   < > 140* 139* 148* 157 199   < > = values in this interval not displayed.   Cardiac Enzymes: No results for  input(s): CKTOTAL, CKMB, CKMBINDEX, TROPONINI in the last 168 hours. BNP: Invalid input(s): POCBNP CBG: Recent Labs  Lab 01/04/20 0802 01/04/20 1153  GLUCAP 105* 108*   D-Dimer No results for input(s): DDIMER in the last 72 hours. Hgb A1c No results for input(s): HGBA1C in the last 72 hours. Lipid Profile No results for input(s): CHOL, HDL, LDLCALC, TRIG, CHOLHDL, LDLDIRECT in the last 72 hours. Thyroid function studies No results for input(s): TSH, T4TOTAL, T3FREE, THYROIDAB in the last 72 hours.  Invalid input(s): FREET3 Anemia work up No results for input(s): VITAMINB12, FOLATE, FERRITIN, TIBC, IRON, RETICCTPCT in the last 72 hours. Urinalysis    Component Value Date/Time   COLORURINE YELLOW (A) 12/31/2019 2218   APPEARANCEUR TURBID (A) 12/31/2019 2218   APPEARANCEUR Hazy 08/02/2013 0020   LABSPEC 1.015 12/31/2019 2218   LABSPEC 1.012 08/02/2013 0020   PHURINE 5.0 12/31/2019 2218   GLUCOSEU NEGATIVE 12/31/2019 2218   GLUCOSEU Negative 08/02/2013 0020   HGBUR MODERATE (A) 12/31/2019 2218   BILIRUBINUR NEGATIVE 12/31/2019 2218   BILIRUBINUR Negative 08/02/2013 0020   KETONESUR NEGATIVE 12/31/2019 2218   PROTEINUR 100 (A) 12/31/2019 2218   NITRITE NEGATIVE 12/31/2019 2218   LEUKOCYTESUR LARGE (A) 12/31/2019 2218   LEUKOCYTESUR 2+ 08/02/2013 0020   Sepsis Labs Invalid input(s): PROCALCITONIN,  WBC,  LACTICIDVEN Microbiology Recent Results (from the past 240 hour(s))  SARS Coronavirus 2 by RT PCR (hospital order, performed in [facility] hospital lab) Nasopharyngeal     Status: None   Collection Time: 12/31/19 10:18 PM   Specimen: Nasopharyngeal  Result Value Ref Range Status   SARS Coronavirus 2 NEGATIVE NEGATIVE Final    Comment: (NOTE) SARS-CoV-2 target nucleic acids are NOT DETECTED.  The SARS-CoV-2 RNA is generally detectable in upper and lower respiratory specimens during the acute phase of infection. The lowest concentration of SARS-CoV-2 viral copies this  assay can detect is 250 copies / mL. A negative result does not preclude SARS-CoV-2 infection and should not be used as the sole basis for treatment or other patient management decisions.  A negative result may occur with improper specimen collection / handling, submission of specimen other than nasopharyngeal swab, presence of viral mutation(s) within the areas targeted by this assay, and inadequate number of viral copies (<250 copies / mL). A negative result must be combined with clinical observations, patient history, and epidemiological information.  Fact Sheet for Patients:   StrictlyIdeas.no  Fact Sheet for Healthcare Providers: BankingDealers.co.za  This test is not yet approved or  cleared by the [place] FDA and has  been authorized for detection and/or diagnosis of SARS-CoV-2 by FDA under an Emergency Use Authorization (EUA).  This EUA will remain in effect (meaning this test can be used) for the duration of the COVID-19 declaration under Section 564(b)(1) of the Act, 21 U.S.C. section 360bbb-3(b)(1), unless the authorization is terminated or revoked sooner.  Performed at [facility], [address], [place], [place] [address]   Urine Culture     Status: None   Collection Time: 01/01/20  1:35 PM   Specimen: Urine, Random  Result Value Ref Range Status   Specimen Description   Final    URINE, RANDOM Performed at [facility], [address], [place], [place] [address]    Special Requests   Final    NONE Performed at [facility], [address], [place], [place] [address]    Culture   Final    NO GROWTH Performed at [facility], [place] [address], [place], [place] [address]    Report Status 01/03/2020 FINAL  Final  CULTURE, BLOOD (ROUTINE X 2) w Reflex to ID Panel     Status: None   Collection Time: 01/01/20  6:38 PM   Specimen: BLOOD  Result Value Ref Range Status   Specimen  Description BLOOD BLOOD LEFT HAND  Final   Special Requests   Final    BOTTLES DRAWN AEROBIC AND ANAEROBIC Blood Culture results may not be optimal due to an inadequate volume of blood received in culture bottles   Culture   Final    NO GROWTH 5 DAYS Performed at [facility], [address] [place], [place] [address]    Report Status 01/06/2020 FINAL  Final  CULTURE, BLOOD (ROUTINE X 2) w Reflex to ID Panel     Status: None   Collection Time: 01/01/20  6:40 PM   Specimen: BLOOD  Result Value Ref Range Status   Specimen Description BLOOD LEFT ANTECUBITAL  Final   Special Requests   Final    BOTTLES DRAWN AEROBIC AND ANAEROBIC Blood Culture adequate volume   Culture   Final    NO GROWTH 5 DAYS Performed at [facility], [address] [place], [place] [address]    Report Status 01/06/2020 FINAL  Final   Time coordinating discharge: 35 minutes  SIGNED:  Kerney Elbe, DO [facility] 01/06/2020, 8:02 AM Pager is on AMION  If 7PM-7AM, please contact night-coverage [email or web address]

## 2020-01-06 NOTE — Plan of Care (Signed)
  Problem: Health Behavior/Discharge Planning: Goal: Ability to manage health-related needs will improve Outcome: Progressing   

## 2020-01-26 ENCOUNTER — Other Ambulatory Visit: Payer: Self-pay

## 2020-01-26 ENCOUNTER — Inpatient Hospital Stay
Admission: EM | Admit: 2020-01-26 | Discharge: 2020-02-03 | DRG: 699 | Disposition: A | Discharge status: Other Acute Inpatient Hospital | Payer: Medicare Other | Attending: Internal Medicine | Admitting: Internal Medicine

## 2020-01-26 ENCOUNTER — Emergency Department: Payer: Medicare Other

## 2020-01-26 DIAGNOSIS — I739 Peripheral vascular disease, unspecified: Secondary | ICD-10-CM | POA: Diagnosis present

## 2020-01-26 DIAGNOSIS — Z87891 Personal history of nicotine dependence: Secondary | ICD-10-CM

## 2020-01-26 DIAGNOSIS — R29898 Other symptoms and signs involving the musculoskeletal system: Secondary | ICD-10-CM | POA: Diagnosis present

## 2020-01-26 DIAGNOSIS — R5381 Other malaise: Secondary | ICD-10-CM | POA: Diagnosis present

## 2020-01-26 DIAGNOSIS — Y92239 Unspecified place in hospital as the place of occurrence of the external cause: Secondary | ICD-10-CM | POA: Diagnosis not present

## 2020-01-26 DIAGNOSIS — I129 Hypertensive chronic kidney disease with stage 1 through stage 4 chronic kidney disease, or unspecified chronic kidney disease: Secondary | ICD-10-CM | POA: Diagnosis present

## 2020-01-26 DIAGNOSIS — J449 Chronic obstructive pulmonary disease, unspecified: Secondary | ICD-10-CM | POA: Diagnosis present

## 2020-01-26 DIAGNOSIS — G4733 Obstructive sleep apnea (adult) (pediatric): Secondary | ICD-10-CM | POA: Diagnosis present

## 2020-01-26 DIAGNOSIS — Z6841 Body Mass Index (BMI) 40.0 and over, adult: Secondary | ICD-10-CM | POA: Diagnosis not present

## 2020-01-26 DIAGNOSIS — W1840XA Slipping, tripping and stumbling without falling, unspecified, initial encounter: Secondary | ICD-10-CM | POA: Diagnosis not present

## 2020-01-26 DIAGNOSIS — Z23 Encounter for immunization: Secondary | ICD-10-CM | POA: Diagnosis present

## 2020-01-26 DIAGNOSIS — I700 Atherosclerosis of aorta: Secondary | ICD-10-CM | POA: Diagnosis present

## 2020-01-26 DIAGNOSIS — M25562 Pain in left knee: Secondary | ICD-10-CM | POA: Diagnosis not present

## 2020-01-26 DIAGNOSIS — S72432A Displaced fracture of medial condyle of left femur, initial encounter for closed fracture: Secondary | ICD-10-CM | POA: Clinically undetermined

## 2020-01-26 DIAGNOSIS — G35 Multiple sclerosis: Secondary | ICD-10-CM | POA: Diagnosis present

## 2020-01-26 DIAGNOSIS — W19XXXA Unspecified fall, initial encounter: Secondary | ICD-10-CM | POA: Diagnosis present

## 2020-01-26 DIAGNOSIS — L89152 Pressure ulcer of sacral region, stage 2: Secondary | ICD-10-CM | POA: Diagnosis present

## 2020-01-26 DIAGNOSIS — F329 Major depressive disorder, single episode, unspecified: Secondary | ICD-10-CM | POA: Diagnosis present

## 2020-01-26 DIAGNOSIS — N1832 Chronic kidney disease, stage 3b: Secondary | ICD-10-CM | POA: Diagnosis present

## 2020-01-26 DIAGNOSIS — Y846 Urinary catheterization as the cause of abnormal reaction of the patient, or of later complication, without mention of misadventure at the time of the procedure: Secondary | ICD-10-CM | POA: Diagnosis present

## 2020-01-26 DIAGNOSIS — C8213 Follicular lymphoma grade II, intra-abdominal lymph nodes: Secondary | ICD-10-CM | POA: Diagnosis not present

## 2020-01-26 DIAGNOSIS — N390 Urinary tract infection, site not specified: Secondary | ICD-10-CM | POA: Diagnosis present

## 2020-01-26 DIAGNOSIS — N1831 Chronic kidney disease, stage 3a: Secondary | ICD-10-CM | POA: Diagnosis not present

## 2020-01-26 DIAGNOSIS — M419 Scoliosis, unspecified: Secondary | ICD-10-CM | POA: Diagnosis present

## 2020-01-26 DIAGNOSIS — N319 Neuromuscular dysfunction of bladder, unspecified: Secondary | ICD-10-CM | POA: Diagnosis present

## 2020-01-26 DIAGNOSIS — Z8572 Personal history of non-Hodgkin lymphomas: Secondary | ICD-10-CM | POA: Diagnosis not present

## 2020-01-26 DIAGNOSIS — J441 Chronic obstructive pulmonary disease with (acute) exacerbation: Secondary | ICD-10-CM | POA: Diagnosis present

## 2020-01-26 DIAGNOSIS — M25569 Pain in unspecified knee: Secondary | ICD-10-CM

## 2020-01-26 DIAGNOSIS — I10 Essential (primary) hypertension: Secondary | ICD-10-CM

## 2020-01-26 DIAGNOSIS — D631 Anemia in chronic kidney disease: Secondary | ICD-10-CM | POA: Diagnosis present

## 2020-01-26 DIAGNOSIS — T83511A Infection and inflammatory reaction due to indwelling urethral catheter, initial encounter: Secondary | ICD-10-CM | POA: Diagnosis not present

## 2020-01-26 DIAGNOSIS — S72342A Displaced spiral fracture of shaft of left femur, initial encounter for closed fracture: Secondary | ICD-10-CM | POA: Diagnosis not present

## 2020-01-26 DIAGNOSIS — Z79899 Other long term (current) drug therapy: Secondary | ICD-10-CM

## 2020-01-26 DIAGNOSIS — G8929 Other chronic pain: Secondary | ICD-10-CM | POA: Diagnosis present

## 2020-01-26 DIAGNOSIS — N312 Flaccid neuropathic bladder, not elsewhere classified: Secondary | ICD-10-CM | POA: Diagnosis present

## 2020-01-26 DIAGNOSIS — H50112 Monocular exotropia, left eye: Secondary | ICD-10-CM | POA: Diagnosis present

## 2020-01-26 DIAGNOSIS — G43909 Migraine, unspecified, not intractable, without status migrainosus: Secondary | ICD-10-CM | POA: Diagnosis present

## 2020-01-26 DIAGNOSIS — Z20822 Contact with and (suspected) exposure to covid-19: Secondary | ICD-10-CM | POA: Diagnosis present

## 2020-01-26 DIAGNOSIS — N179 Acute kidney failure, unspecified: Secondary | ICD-10-CM | POA: Diagnosis present

## 2020-01-26 LAB — CBC WITH DIFFERENTIAL/PLATELET
Abs Immature Granulocytes: 0.04 10*3/uL (ref 0.00–0.07)
Basophils Absolute: 0.1 10*3/uL (ref 0.0–0.1)
Basophils Relative: 1 %
Eosinophils Absolute: 0 10*3/uL (ref 0.0–0.5)
Eosinophils Relative: 0 %
HCT: 36.3 % (ref 36.0–46.0)
Hemoglobin: 11.8 g/dL — ABNORMAL LOW (ref 12.0–15.0)
Immature Granulocytes: 0 %
Lymphocytes Relative: 5 %
Lymphs Abs: 0.5 10*3/uL — ABNORMAL LOW (ref 0.7–4.0)
MCH: 28.3 pg (ref 26.0–34.0)
MCHC: 32.5 g/dL (ref 30.0–36.0)
MCV: 87.1 fL (ref 80.0–100.0)
Monocytes Absolute: 1 10*3/uL (ref 0.1–1.0)
Monocytes Relative: 11 %
Neutro Abs: 7.7 10*3/uL (ref 1.7–7.7)
Neutrophils Relative %: 83 %
Platelets: 148 10*3/uL — ABNORMAL LOW (ref 150–400)
RBC: 4.17 MIL/uL (ref 3.87–5.11)
RDW: 15.1 % (ref 11.5–15.5)
WBC: 9.3 10*3/uL (ref 4.0–10.5)
nRBC: 0 % (ref 0.0–0.2)

## 2020-01-26 LAB — URINALYSIS, COMPLETE (UACMP) WITH MICROSCOPIC
Bilirubin Urine: NEGATIVE
Glucose, UA: NEGATIVE mg/dL
Ketones, ur: NEGATIVE mg/dL
Nitrite: NEGATIVE
Protein, ur: 100 mg/dL — AB
Specific Gravity, Urine: 1.021 (ref 1.005–1.030)
WBC, UA: >50 WBC/hpf — ABNORMAL HIGH (ref 0–5)
pH: 5 (ref 5.0–8.0)

## 2020-01-26 LAB — COMPREHENSIVE METABOLIC PANEL
ALT: 16 U/L (ref 0–44)
AST: 15 U/L (ref 15–41)
Albumin: 3.9 g/dL (ref 3.5–5.0)
Alkaline Phosphatase: 66 U/L (ref 38–126)
Anion gap: 9 (ref 5–15)
BUN: 18 mg/dL (ref 8–23)
CO2: 26 mmol/L (ref 22–32)
Calcium: 9 mg/dL (ref 8.9–10.3)
Chloride: 100 mmol/L (ref 98–111)
Creatinine, Ser: 1.35 mg/dL — ABNORMAL HIGH (ref 0.44–1.00)
GFR calc Af Amer: 46 mL/min — ABNORMAL LOW (ref >60)
GFR calc non Af Amer: 40 mL/min — ABNORMAL LOW (ref >60)
Glucose, Bld: 131 mg/dL — ABNORMAL HIGH (ref 70–99)
Potassium: 3.9 mmol/L (ref 3.5–5.1)
Sodium: 135 mmol/L (ref 135–145)
Total Bilirubin: 0.6 mg/dL (ref 0.3–1.2)
Total Protein: 7.5 g/dL (ref 6.5–8.1)

## 2020-01-26 LAB — RESPIRATORY PANEL BY RT PCR (FLU A&B, COVID)
Influenza A by PCR: NEGATIVE
Influenza B by PCR: NEGATIVE
SARS Coronavirus 2 by RT PCR: NEGATIVE

## 2020-01-26 LAB — PROTIME-INR
INR: 1 (ref 0.8–1.2)
Prothrombin Time: 13.1 seconds (ref 11.4–15.2)

## 2020-01-26 LAB — LACTIC ACID, PLASMA: Lactic Acid, Venous: 1.3 mmol/L (ref 0.5–1.9)

## 2020-01-26 LAB — APTT: aPTT: 39 seconds — ABNORMAL HIGH (ref 24–36)

## 2020-01-26 MED ORDER — SODIUM CHLORIDE 0.9% FLUSH
3.0000 mL | Freq: Two times a day (BID) | INTRAVENOUS | Status: DC
  Administered 2020-01-26 – 2020-02-01 (×9): 3 mL via INTRAVENOUS

## 2020-01-26 MED ORDER — SODIUM CHLORIDE 0.9% FLUSH
3.0000 mL | Freq: Two times a day (BID) | INTRAVENOUS | Status: DC
  Administered 2020-01-26 – 2020-02-03 (×12): 3 mL via INTRAVENOUS

## 2020-01-26 MED ORDER — ONDANSETRON HCL 4 MG/2ML IJ SOLN: 4.0000 mg | Freq: Four times a day (QID) | INTRAMUSCULAR | Status: DC | PRN

## 2020-01-26 MED ORDER — DULOXETINE HCL 60 MG PO CPEP
60.0000 mg | ORAL_CAPSULE | ORAL | Status: DC
  Administered 2020-01-27 – 2020-02-03 (×8): 60 mg via ORAL
  Filled 2020-01-26 (×8): qty 1

## 2020-01-26 MED ORDER — HYDRALAZINE HCL 20 MG/ML IJ SOLN: 10.0000 mg | Freq: Four times a day (QID) | INTRAMUSCULAR | Status: DC | PRN

## 2020-01-26 MED ORDER — QUETIAPINE FUMARATE ER 50 MG PO TB24
150.0000 mg | ORAL_TABLET | Freq: Every day | ORAL | Status: DC
  Administered 2020-01-27 – 2020-02-03 (×8): 150 mg via ORAL
  Filled 2020-01-26 (×8): qty 3

## 2020-01-26 MED ORDER — VITAMIN B-12 1000 MCG PO TABS
1000.0000 ug | ORAL_TABLET | Freq: Every day | ORAL | Status: DC
  Administered 2020-01-27 – 2020-02-03 (×8): 1000 ug via ORAL
  Filled 2020-01-26 (×8): qty 1

## 2020-01-26 MED ORDER — GABAPENTIN 600 MG PO TABS
600.0000 mg | ORAL_TABLET | Freq: Three times a day (TID) | ORAL | Status: DC
  Administered 2020-01-27 – 2020-02-03 (×25): 600 mg via ORAL
  Filled 2020-01-26 (×26): qty 1

## 2020-01-26 MED ORDER — FLUTICASONE FUROATE-VILANTEROL 200-25 MCG/INH IN AEPB
1.0000 | INHALATION_SPRAY | Freq: Every day | RESPIRATORY_TRACT | Status: DC
  Administered 2020-01-27 – 2020-02-03 (×8): 1 via RESPIRATORY_TRACT
  Filled 2020-01-26: qty 28

## 2020-01-26 MED ORDER — POLYETHYLENE GLYCOL 3350 17 G PO PACK: 17.0000 g | PACK | Freq: Every day | ORAL | Status: DC | PRN

## 2020-01-26 MED ORDER — ADULT MULTIVITAMIN W/MINERALS CH
1.0000 | ORAL_TABLET | Freq: Every day | ORAL | Status: DC
  Administered 2020-01-27 – 2020-02-03 (×8): 1 via ORAL
  Filled 2020-01-26 (×8): qty 1

## 2020-01-26 MED ORDER — BACLOFEN 10 MG PO TABS
10.0000 mg | ORAL_TABLET | Freq: Three times a day (TID) | ORAL | Status: DC
  Administered 2020-01-27: 10 mg via ORAL
  Filled 2020-01-26 (×3): qty 1

## 2020-01-26 MED ORDER — MIRABEGRON ER 50 MG PO TB24
50.0000 mg | ORAL_TABLET | Freq: Every day | ORAL | Status: DC
  Administered 2020-01-27 – 2020-02-03 (×8): 50 mg via ORAL
  Filled 2020-01-26 (×9): qty 1

## 2020-01-26 MED ORDER — SODIUM CHLORIDE 0.9 % IV SOLN: INTRAVENOUS | Status: AC

## 2020-01-26 MED ORDER — SODIUM CHLORIDE 0.9 % IV BOLUS (SEPSIS)
1000.0000 mL | Freq: Once | INTRAVENOUS | Status: AC
  Administered 2020-01-26: 1000 mL via INTRAVENOUS

## 2020-01-26 MED ORDER — ONDANSETRON HCL 4 MG/2ML IJ SOLN
4.0000 mg | Freq: Once | INTRAMUSCULAR | Status: AC
  Administered 2020-01-26: 4 mg via INTRAVENOUS
  Filled 2020-01-26: qty 2

## 2020-01-26 MED ORDER — VORTIOXETINE HBR 5 MG PO TABS
5.0000 mg | ORAL_TABLET | Freq: Every day | ORAL | Status: DC
  Administered 2020-01-27 – 2020-02-03 (×8): 5 mg via ORAL
  Filled 2020-01-26 (×9): qty 1

## 2020-01-26 MED ORDER — BUPROPION HCL ER (XL) 150 MG PO TB24
300.0000 mg | ORAL_TABLET | Freq: Every day | ORAL | Status: DC
  Administered 2020-01-27 – 2020-02-03 (×8): 300 mg via ORAL
  Filled 2020-01-26 (×8): qty 2

## 2020-01-26 MED ORDER — ACETAMINOPHEN 650 MG RE SUPP: 650.0000 mg | Freq: Four times a day (QID) | RECTAL | Status: DC | PRN

## 2020-01-26 MED ORDER — VANCOMYCIN HCL 1250 MG/250ML IV SOLN
1250.0000 mg | Freq: Once | INTRAVENOUS | Status: AC
  Administered 2020-01-26: 1250 mg via INTRAVENOUS
  Filled 2020-01-26: qty 250

## 2020-01-26 MED ORDER — MAGNESIUM OXIDE 400 (241.3 MG) MG PO TABS
400.0000 mg | ORAL_TABLET | Freq: Every day | ORAL | Status: DC
  Administered 2020-01-27 – 2020-02-03 (×8): 400 mg via ORAL
  Filled 2020-01-26 (×8): qty 1

## 2020-01-26 MED ORDER — INSULIN ASPART 100 UNIT/ML ~~LOC~~ SOLN
0.0000 [IU] | Freq: Every day | SUBCUTANEOUS | Status: DC
  Administered 2020-01-27: 2 [IU] via SUBCUTANEOUS
  Filled 2020-01-26 (×2): qty 1

## 2020-01-26 MED ORDER — INSULIN ASPART 100 UNIT/ML ~~LOC~~ SOLN
0.0000 [IU] | Freq: Three times a day (TID) | SUBCUTANEOUS | Status: DC
  Administered 2020-01-27: 2 [IU] via SUBCUTANEOUS
  Administered 2020-01-27: 3 [IU] via SUBCUTANEOUS
  Administered 2020-01-27: 2 [IU] via SUBCUTANEOUS
  Administered 2020-01-28: 3 [IU] via SUBCUTANEOUS
  Administered 2020-01-28 (×2): 2 [IU] via SUBCUTANEOUS
  Administered 2020-01-29 – 2020-01-30 (×4): 3 [IU] via SUBCUTANEOUS
  Administered 2020-01-30: 2 [IU] via SUBCUTANEOUS
  Administered 2020-01-30 – 2020-01-31 (×2): 3 [IU] via SUBCUTANEOUS
  Administered 2020-01-31: 2 [IU] via SUBCUTANEOUS
  Administered 2020-01-31: 3 [IU] via SUBCUTANEOUS
  Administered 2020-02-01 – 2020-02-03 (×2): 2 [IU] via SUBCUTANEOUS
  Filled 2020-01-26 (×16): qty 1

## 2020-01-26 MED ORDER — ACETAMINOPHEN 325 MG PO TABS
650.0000 mg | ORAL_TABLET | Freq: Four times a day (QID) | ORAL | Status: DC | PRN
  Administered 2020-01-28 (×2): 650 mg via ORAL
  Filled 2020-01-26 (×2): qty 2

## 2020-01-26 MED ORDER — AMLODIPINE BESYLATE 5 MG PO TABS: 5.0000 mg | ORAL_TABLET | Freq: Every day | ORAL | Status: DC

## 2020-01-26 MED ORDER — CLONAZEPAM 1 MG PO TABS
1.0000 mg | ORAL_TABLET | Freq: Two times a day (BID) | ORAL | Status: DC | PRN
  Administered 2020-02-01 – 2020-02-02 (×2): 1 mg via ORAL
  Filled 2020-01-26 (×2): qty 1

## 2020-01-26 MED ORDER — ENOXAPARIN SODIUM 40 MG/0.4ML ~~LOC~~ SOLN
40.0000 mg | SUBCUTANEOUS | Status: DC
  Administered 2020-01-27 – 2020-02-02 (×6): 40 mg via SUBCUTANEOUS
  Filled 2020-01-26 (×4): qty 0.4

## 2020-01-26 MED ORDER — SODIUM CHLORIDE 0.9% FLUSH: 3.0000 mL | INTRAVENOUS | Status: DC | PRN

## 2020-01-26 MED ORDER — METRONIDAZOLE IN NACL 5-0.79 MG/ML-% IV SOLN
500.0000 mg | Freq: Once | INTRAVENOUS | Status: AC
  Administered 2020-01-26: 500 mg via INTRAVENOUS
  Filled 2020-01-26: qty 100

## 2020-01-26 MED ORDER — ATORVASTATIN CALCIUM 10 MG PO TABS
10.0000 mg | ORAL_TABLET | Freq: Every day | ORAL | Status: DC
  Administered 2020-01-27 – 2020-02-03 (×9): 10 mg via ORAL
  Filled 2020-01-26 (×9): qty 1

## 2020-01-26 MED ORDER — TERIFLUNOMIDE 14 MG PO TABS: 14.0000 mg | ORAL_TABLET | Freq: Every day | ORAL | Status: DC

## 2020-01-26 MED ORDER — ONDANSETRON HCL 4 MG PO TABS: 4.0000 mg | ORAL_TABLET | Freq: Four times a day (QID) | ORAL | Status: DC | PRN

## 2020-01-26 MED ORDER — FAMOTIDINE 20 MG PO TABS
20.0000 mg | ORAL_TABLET | Freq: Every day | ORAL | Status: DC
  Administered 2020-01-27 – 2020-01-31 (×5): 20 mg via ORAL
  Filled 2020-01-26 (×5): qty 1

## 2020-01-26 MED ORDER — VANCOMYCIN HCL IN DEXTROSE 1-5 GM/200ML-% IV SOLN
1000.0000 mg | Freq: Once | INTRAVENOUS | Status: AC
  Administered 2020-01-26: 1000 mg via INTRAVENOUS
  Filled 2020-01-26: qty 200

## 2020-01-26 MED ORDER — DOCUSATE SODIUM 100 MG PO CAPS: 100.0000 mg | ORAL_CAPSULE | Freq: Two times a day (BID) | ORAL | Status: DC | PRN

## 2020-01-26 MED ORDER — RISAQUAD PO CAPS
1.0000 | ORAL_CAPSULE | Freq: Three times a day (TID) | ORAL | Status: DC
  Administered 2020-01-27 – 2020-01-31 (×12): 1 via ORAL
  Filled 2020-01-26 (×14): qty 1

## 2020-01-26 MED ORDER — SODIUM CHLORIDE 0.9 % IV SOLN
250.0000 mL | INTRAVENOUS | Status: DC | PRN
  Administered 2020-01-27: 250 mL via INTRAVENOUS

## 2020-01-26 MED ORDER — HYDROMORPHONE HCL 1 MG/ML IJ SOLN
1.0000 mg | Freq: Once | INTRAMUSCULAR | Status: AC
  Administered 2020-01-26: 1 mg via INTRAVENOUS
  Filled 2020-01-26: qty 1

## 2020-01-26 MED ORDER — SODIUM CHLORIDE 0.9 % IV SOLN
1000.0000 mg | Freq: Once | INTRAVENOUS | Status: AC
  Administered 2020-01-26: 1000 mg via INTRAVENOUS
  Filled 2020-01-26: qty 8

## 2020-01-26 MED ORDER — SODIUM CHLORIDE 0.9 % IV SOLN: 1.0000 g | INTRAVENOUS | Status: DC

## 2020-01-26 MED ORDER — LISINOPRIL 10 MG PO TABS: 20.0000 mg | ORAL_TABLET | Freq: Every day | ORAL | Status: DC

## 2020-01-26 MED ORDER — SODIUM CHLORIDE 0.9 % IV SOLN
1000.0000 mg | INTRAVENOUS | Status: AC
  Administered 2020-01-27 – 2020-01-30 (×4): 1000 mg via INTRAVENOUS
  Filled 2020-01-26 (×4): qty 8

## 2020-01-26 MED ORDER — SODIUM CHLORIDE 0.9 % IV SOLN
2.0000 g | Freq: Once | INTRAVENOUS | Status: AC
  Administered 2020-01-26: 2 g via INTRAVENOUS
  Filled 2020-01-26: qty 2

## 2020-01-26 MED ORDER — ACETAMINOPHEN 500 MG PO TABS
1000.0000 mg | ORAL_TABLET | Freq: Three times a day (TID) | ORAL | Status: DC | PRN
  Administered 2020-01-27 – 2020-02-03 (×16): 1000 mg via ORAL
  Filled 2020-01-26 (×16): qty 2

## 2020-01-26 MED ORDER — HYDRALAZINE HCL 50 MG PO TABS
50.0000 mg | ORAL_TABLET | Freq: Three times a day (TID) | ORAL | Status: DC
  Administered 2020-01-27: 50 mg via ORAL
  Filled 2020-01-26: qty 1

## 2020-01-26 NOTE — ED Notes (Addendum)
Blood culture set 2 collected

## 2020-01-26 NOTE — ED Provider Notes (Signed)
[facility] Emergency Department Provider Note  ____________________________________________  Time seen: Approximately 4:05 PM  I have reviewed the triage vital signs and the nursing notes.   HISTORY  Chief Complaint Weakness    HPI [name] is a 69 y.o. female with a history of COPD morbid obesity multiple sclerosis who comes ED with worsening generalized weakness for the past 7 days associated with also severe weakness of the left leg and difficulty lifting or moving it.  She reports this is happened in the past with multiple sclerosis flareups.  Denies any chest pain or shortness of breath but has had some cough and some body aches and fatigue.  Also notes that she is not had a bowel movement in a week.  Has had occasional vomiting.  Symptoms are constant without aggravating or alleviating factors.  The leg weakness is caused her to fall twice in the last few days.  Denies head injury.     Past Medical History:  Diagnosis Date  . Abdominal aortic atherosclerosis ([place]) 11/11/2016  . ADHD   . Anxiety   . COPD (chronic obstructive pulmonary disease) ([place])   . Depression    major depressive  . Dyspnea    doe  . Edema    left leg  . Follicular lymphoma ([place])    B Cell  . Follicular lymphoma grade II ([place])   . Hypertension   . Hypotension    idiopathic  . Kyphoscoliosis and scoliosis 11/26/2011  . Morbid obesity ([place]) 01/05/2016  . Multiple sclerosis ([place])   . Multiple sclerosis ([place])    1980's  . Neuromuscular disorder ([place])   . Obstructive and reflux uropathy    foley  . Pain    atypical facial  . Peripheral vascular disease of lower extremity with ulceration ([place]) 11/08/2015  . Skin ulcer ([place]) 11/08/2015  . Weakness    generalized. has MS     Patient Active Problem List   Diagnosis Date Noted  . COPD (chronic obstructive pulmonary disease) ([place]) 01/01/2020  . Chronic indwelling Foley catheter 11/19/2019  . Neuropathy   .  Hypokalemia   . Hyperlipidemia   . Sepsis secondary to UTI ([place]) 10/17/2019  . Weakness   . AKI (acute kidney injury) ([place]) 04/20/2019  . Pressure injury of skin 02/03/2019  . Bilateral lower leg cellulitis 02/02/2019  . Ovarian mass, left 01/27/2019  . Sepsis ([place]) 01/05/2019  . UTI (urinary tract infection) 06/13/2018  . Altered mental status 06/11/2018  . Fall 05/13/2018  . Depression 05/13/2018  . Recurrent cellulitis of lower extremity 06/25/2017  . Medication monitoring encounter 06/05/2017  . Cellulitis of left lower leg 04/25/2017  . Adjustment disorder with mixed disturbance of emotions and conduct 04/23/2017  . Foot pain, bilateral 02/03/2017  . Tinea pedis 02/03/2017  . Left ovarian cyst 12/09/2016  . Abdominal aortic atherosclerosis ([place]) 11/11/2016  . Swelling of lower extremity 10/11/2016  . Obstructive sleep apnea 10/11/2016  . Follicular lymphoma of intra-abdominal lymph nodes ([place]) 08/06/2016  . Multiple falls 06/07/2016  . Inguinal adenopathy 05/29/2016  . Obesity, morbid ([place]) 01/05/2016  . Low HDL (under 40) 12/19/2015  . Cellulitis 12/18/2015  . Skin ulcer ([place]) 11/08/2015  . Peripheral vascular disease of lower extremity with ulceration ([place]) 11/08/2015  . CKD (chronic kidney disease) stage 3, GFR 30-59 ml/min 11/08/2015  . Constipation due to pain medication 01/31/2015  . Obstructive sleep apnea of adult 01/13/2015  . Pelvic muscle wasting 01/13/2015  . Incomplete bladder emptying  01/13/2015  . Headache, migraine 10/24/2014  . Neurogenic bladder 10/24/2014  . Current tobacco use 10/24/2014  . Lumbar radiculopathy, chronic 10/02/2013  . COPD with bronchial hyperresponsiveness ([place]) 10/02/2013  . Major depressive disorder, recurrent, in partial remission ([place]) 10/02/2013  . Essential hypertension 10/02/2013  . Multiple sclerosis ([place]) 10/02/2013  . Absence of bladder continence 09/25/2012  . Acontractile bladder 02/12/2012  . Narrowing of  intervertebral disc space 11/26/2011  . Kyphoscoliosis and scoliosis 11/26/2011     Past Surgical History:  Procedure Laterality Date  . BACK SURGERY N/A 2002  . CYST EXCISION     lower back  . INGUINAL LYMPH NODE BIOPSY Left 07/04/2016   Procedure: INGUINAL LYMPH NODE BIOPSY;  Surgeon: Christene Lye, MD;  Location: [facility] ORS;  Service: General;  Laterality: Left;  . PORTACATH PLACEMENT N/A 07/22/2016   Procedure: INSERTION PORT-A-CATH;  Surgeon: Christene Lye, MD;  Location: [facility] ORS;  Service: General;  Laterality: N/A;  . TONSILLECTOMY AND ADENOIDECTOMY    . TUBAL LIGATION       Prior to Admission medications   Medication Sig Start Date End Date Taking? Authorizing Provider  acetaminophen (TYLENOL) 500 MG tablet Take 1,000 mg by mouth every 8 (eight) hours as needed for moderate pain.    [provider]  acidophilus (RISAQUAD) CAPS capsule Take 1 capsule by mouth 3 (three) times daily. 01/06/20   Raiford Noble Latif, DO  amLODipine (NORVASC) 5 MG tablet Take 5 mg by mouth daily. 09/17/19   [provider]  atorvastatin (LIPITOR) 10 MG tablet Take 10 mg by mouth daily.  01/14/19   [provider]  baclofen (LIORESAL) 10 MG tablet Take 10 mg by mouth 3 (three) times daily.  04/10/19   [provider]  buPROPion (WELLBUTRIN XL) 300 MG 24 hr tablet Take 300 mg by mouth daily.  01/21/19   [provider]  clonazePAM (KLONOPIN) 1 MG tablet Take 1 tablet (1 mg total) by mouth 2 (two) times daily as needed. 01/06/20   Raiford Noble Latif, DO  cyanocobalamin 1000 MCG tablet Take 1,000 mcg by mouth daily.    [provider]  docusate sodium (COLACE) 100 MG capsule Take 1 capsule (100 mg total) by mouth 2 (two) times daily as needed. 11/24/19   Enzo Bi, MD  DULoxetine (CYMBALTA) 60 MG capsule Take 1 capsule (60 mg total) by mouth every morning. 10/14/16   Vaughan Basta, MD  famotidine (PEPCID) 20 MG tablet Take 1 tablet (20 mg  total) by mouth daily. 10/23/19   Loletha Grayer, MD  gabapentin (NEURONTIN) 600 MG tablet Take 1 tablet (600 mg total) by mouth 3 (three) times daily. 02/09/17   Bettey Costa, MD  hydrALAZINE (APRESOLINE) 10 MG tablet Take 1 tablet (10 mg total) by mouth 3 (three) times daily. 01/06/20   Sheikh, Georgina Quint Latif, DO  lisinopril (ZESTRIL) 5 MG tablet Take 1 tablet (5 mg total) by mouth daily. 01/06/20   Raiford Noble Latif, DO  magnesium oxide (MAG-OX) 400 (241.3 Mg) MG tablet Take 1 tablet (400 mg total) by mouth daily. 06/22/19   Nolberto Hanlon, MD  Multiple Vitamin (MULTIVITAMIN WITH MINERALS) TABS tablet Take 1 tablet by mouth daily.    [provider]  MYRBETRIQ 50 MG TB24 tablet TAKE ONE TABLET BY MOUTH ONCE DAILY Patient taking differently: Take 50 mg by mouth daily.  03/08/19   Stoioff, Ronda Fairly, MD  polyethylene glycol powder (GLYCOLAX/MIRALAX) 17 GM/SCOOP powder Take 17 g by mouth daily as  needed for mild constipation. 05/01/19   [provider]  QUEtiapine Fumarate (SEROQUEL XR) 150 MG 24 hr tablet Take 150 mg by mouth at bedtime.     [provider]  Teriflunomide 14 MG TABS Take 14 mg by mouth daily. 12/03/19 03/02/20  [provider]  TRINTELLIX 5 MG TABS tablet Take 5 mg by mouth daily. 12/15/19   [provider]     Allergies Patient has no known allergies.   Family History  Problem Relation Age of Onset  . COPD Mother   . Diabetes Mother   . Heart failure Mother   . Alcohol abuse Father   . Kidney disease Father   . Kidney failure Father   . Arthritis Sister   . CAD Maternal Grandmother   . Stroke Maternal Grandfather   . Arthritis Sister   . Mental illness Sister   . Arthritis Brother     Social History Social History   Tobacco Use  . Smoking status: Former Smoker    Packs/day: 1.00    Years: 20.00    Pack years: 20.00    Types: Cigarettes    Start date: 04/30/1995    Quit date: 02/03/2016    Years since quitting: 3.9  . Smokeless  tobacco: Never Used  Vaping Use  . Vaping Use: Some days  Substance Use Topics  . Alcohol use: No    Alcohol/week: 0.0 standard drinks  . Drug use: Yes    Types: Marijuana    Comment: smokes THC occasionally per pt     Review of Systems  Constitutional:   No fever or chills.  ENT:   No sore throat. No rhinorrhea. Cardiovascular:   No chest pain or syncope. Respiratory:   No dyspnea positive cough. Gastrointestinal:   Negative for abdominal pain, positive vomiting and constipation..  Musculoskeletal: Positive left leg pain and weakness. All other systems reviewed and are negative except as documented above in ROS and HPI.  ____________________________________________   PHYSICAL EXAM:  VITAL SIGNS: ED Triage Vitals  Enc Vitals Group     BP 01/26/20 1451 (!) 151/69     Pulse Rate 01/26/20 1451 94     Resp 01/26/20 1451 20     Temp 01/26/20 1451 99.8 F (37.7 C)     Temp Source 01/26/20 1451 Oral     SpO2 01/26/20 1446 100 %     Weight 01/26/20 1452 260 lb (117.9 kg)     Height 01/26/20 1452 5\' 3"  (1.6 m)     Head Circumference --      Peak Flow --      Pain Score 01/26/20 1452 9     Pain Loc --      Pain Edu? --      Excl. in [place]? --     Vital signs reviewed, nursing assessments reviewed.   Constitutional:   Alert and oriented. Non-toxic appearance. Eyes:   Conjunctivae are normal. EOMI. PERRL. ENT      Head:   Normocephalic and atraumatic.      Nose:   Wearing a mask.      Mouth/Throat:   Wearing a mask.      Neck:   No meningismus. Full ROM. Hematological/Lymphatic/Immunilogical:   No cervical lymphadenopathy. Cardiovascular:   RRR. Symmetric bilateral radial and DP pulses.  No murmurs. Cap refill less than 2 seconds. Respiratory:   Normal respiratory effort without tachypnea/retractions. Breath sounds are clear and equal bilaterally. No wheezes/rales/rhonchi. Gastrointestinal:   Soft and  nontender. Non distended. There is no CVA tenderness.  No rebound,  rigidity, or guarding.  Musculoskeletal: Tenderness at the left hip.  Joint stable, no deformity. Neurologic:   Normal speech and language.  Motor grossly intact. No acute focal neurologic deficits are appreciated.  Skin:    Skin is warm, dry and intact. No rash noted.  No petechiae, purpura, or bullae.  ____________________________________________    LABS (pertinent positives/negatives) (all labs ordered are listed, but only abnormal results are displayed) Labs Reviewed  COMPREHENSIVE METABOLIC PANEL - Abnormal; Notable for the following components:      Result Value   Glucose, Bld 131 (*)    Creatinine, Ser 1.35 (*)    GFR calc non Af Amer 40 (*)    GFR calc Af Amer 46 (*)    All other components within normal limits  CBC WITH DIFFERENTIAL/PLATELET - Abnormal; Notable for the following components:   Hemoglobin 11.8 (*)    Platelets 148 (*)    Lymphs Abs 0.5 (*)    All other components within normal limits  APTT - Abnormal; Notable for the following components:   aPTT 39 (*)    All other components within normal limits  URINALYSIS, COMPLETE (UACMP) WITH MICROSCOPIC - Abnormal; Notable for the following components:   Color, Urine YELLOW (*)    APPearance CLOUDY (*)    Hgb urine dipstick SMALL (*)    Protein, ur 100 (*)    Leukocytes,Ua LARGE (*)    WBC, UA >50 (*)    Bacteria, UA RARE (*)    All other components within normal limits  RESPIRATORY PANEL BY RT PCR (FLU A&B, COVID)  CULTURE, BLOOD (ROUTINE X 2)  CULTURE, BLOOD (ROUTINE X 2)  URINE CULTURE  LACTIC ACID, PLASMA  PROTIME-INR   ____________________________________________   EKG    ____________________________________________    RADIOLOGY  CT HEAD WO CONTRAST  Result Date: 01/26/2020 CLINICAL DATA:  Weakness. EXAM: CT HEAD WITHOUT CONTRAST TECHNIQUE: Contiguous axial images were obtained from the base of the skull through the vertex without intravenous contrast. COMPARISON:  June 20, 2019  FINDINGS: Brain: There is moderate severity cerebral atrophy with widening of the extra-axial spaces and ventricular dilatation. There are areas of decreased attenuation within the white matter tracts of the supratentorial brain, consistent with microvascular disease changes. Vascular: No hyperdense vessel or unexpected calcification. Skull: Normal. Negative for fracture or focal lesion. Sinuses/Orbits: No acute finding. Other: None. IMPRESSION: 1. Generalized cerebral atrophy. 2. No acute intracranial abnormality. Electronically Signed   By: Virgina Norfolk M.D.   On: 01/26/2020 16:27   DG Chest Port 1 View  Result Date: 01/26/2020 CLINICAL DATA:  Weakness. EXAM: PORTABLE CHEST 1 VIEW COMPARISON:  November 19, 2019 FINDINGS: Mild diffuse chronic appearing increased lung markings are seen. There is no evidence of acute infiltrate, pleural effusion or pneumothorax. The heart size and mediastinal contours are within normal limits. The visualized skeletal structures are unremarkable. IMPRESSION: No acute or active cardiopulmonary disease. Electronically Signed   By: Virgina Norfolk M.D.   On: 01/26/2020 16:24   DG Abd 2 Views  Result Date: 01/26/2020 CLINICAL DATA:  Left hip pain, weakness, fall EXAM: X-RAY ABDOMEN 2 VIEWS COMPARISON:  None. FINDINGS: Nonobstructive bowel gas pattern. No free air or organomegaly. No confluent opacity within the lungs. No effusions. Heart is borderline in size. Scoliosis and degenerative changes in the thoracolumbar spine. Posterior fusion changes in the lower lumbar spine. IMPRESSION: No acute findings. Electronically Signed   By:  Rolm Baptise M.D.   On: 01/26/2020 16:45   DG Hip Unilat W or Wo Pelvis 2-3 Views Left  Result Date: 01/26/2020 CLINICAL DATA:  Left hip pain after fall EXAM: DG HIP (WITH OR WITHOUT PELVIS) 2-3V LEFT COMPARISON:  None. FINDINGS: No acute bony abnormality. Specifically, no fracture, subluxation, or dislocation. IMPRESSION: No acute bony  abnormality. Electronically Signed   By: Rolm Baptise M.D.   On: 01/26/2020 16:44    ____________________________________________   PROCEDURES .Critical Care Performed by: Carrie Mew, MD Authorized by: Carrie Mew, MD   Critical care provider statement:    Critical care time (minutes):  33   Critical care time was exclusive of:  Separately billable procedures and treating other patients   Critical care was necessary to treat or prevent imminent or life-threatening deterioration of the following conditions:  Sepsis and CNS failure or compromise   Critical care was time spent personally by me on the following activities:  Development of treatment plan with patient or surrogate, discussions with consultants, evaluation of patient's response to treatment, examination of patient, obtaining history from patient or surrogate, ordering and performing treatments and interventions, ordering and review of laboratory studies, ordering and review of radiographic studies, pulse oximetry, re-evaluation of patient's condition and review of old charts    ____________________________________________  DIFFERENTIAL DIAGNOSIS   Hip fracture, UTI, multiple sclerosis flare, dehydration, electrolyte normality, stroke, intracranial hemorrhage, pneumonia, bowel obstruction, Covid  CLINICAL IMPRESSION / ASSESSMENT AND PLAN / ED COURSE  Medications ordered in the ED: Medications  vancomycin (VANCOCIN) IVPB 1000 mg/200 mL premix (has no administration in time range)  methylPREDNISolone sodium succinate (SOLU-MEDROL) 1,000 mg in sodium chloride 0.9 % 50 mL IVPB (has no administration in time range)  sodium chloride 0.9 % bolus 1,000 mL (0 mLs Intravenous Stopped 01/26/20 1629)  ceFEPIme (MAXIPIME) 2 g in sodium chloride 0.9 % 100 mL IVPB (0 g Intravenous Stopped 01/26/20 1638)  metroNIDAZOLE (FLAGYL) IVPB 500 mg (0 mg Intravenous Stopped 01/26/20 1801)  vancomycin (VANCOREADY) IVPB 1250 mg/250 mL  (1,250 mg Intravenous New Bag/Given 01/26/20 1852)  HYDROmorphone (DILAUDID) injection 1 mg (1 mg Intravenous Given 01/26/20 1704)  ondansetron (ZOFRAN) injection 4 mg (4 mg Intravenous Given 01/26/20 1702)    Pertinent labs & imaging results that were available during my care of the patient were reviewed by me and considered in my medical decision making (see chart for details).  [name] was evaluated in Emergency Department on 01/26/2020 for the symptoms described in the history of present illness. She was evaluated in the context of the global COVID-19 pandemic, which necessitated consideration that the patient might be at risk for infection with the SARS-CoV-2 virus that causes COVID-19. Institutional protocols and algorithms that pertain to the evaluation of patients at risk for COVID-19 are in a state of rapid change based on information released by regulatory bodies including the CDC and federal and state organizations. These policies and algorithms were followed during the patient's care in the ED.   Patient presents with weakness and several other symptoms.  Will obtain broad work-up, sepsis protocol, chest x-ray, abdomen and hip x-ray, CT head.  Empiric fluids and IV antibiotics.  No signs of shock on initial assessment.  Chest x-ray reviewed by me, no pneumonia pneumothorax or pulmonary edema.  No pleural effusion.  Overall unremarkable.  Clinical Course as of Jan 25 2049  Wed Jan 26, 2020  1610 While work-up in process, plan to treat initial symptoms with Dilaudid 1  mg IV, Zofran 4 mg IV, Solu-Medrol 1000 mg IV   [PS]  1610 Lactate normal, vital signs normalized, no signs of shock.   [PS]  1909 Covid negative.    [PS]  2046 UA consistent with UTI in the setting of chronic indwelling Foley.  Patient is still feeling very symptomatic.  We will plan to admit for continued antibiotic coverage and high-dose steroids until improving.  Urinalysis, Complete w Microscopic Urine,  Catheterized(!) [PS]    Clinical Course User Index [PS] Carrie Mew, MD     ____________________________________________   FINAL CLINICAL IMPRESSION(S) / ED DIAGNOSES    Final diagnoses:  Urinary tract infection associated with indwelling urethral catheter, initial encounter ([place])  Multiple sclerosis ([place])  Morbid obesity [facility])     ED Discharge Orders    None      Portions of this note were generated with dragon dictation software. Dictation errors may occur despite best attempts at proofreading.   Carrie Mew, MD 01/26/20 2051

## 2020-01-26 NOTE — ED Notes (Signed)
Report given to [place]

## 2020-01-26 NOTE — Consult Note (Signed)
CODE SEPSIS - PHARMACY COMMUNICATION  **Broad Spectrum Antibiotics should be administered within 1 hour of Sepsis diagnosis**  Time Code Sepsis Called/Page Received: 1502  Antibiotics Ordered: Cefepime/Vancomycin  Time of 1st antibiotic administration: 1509   Additional action taken by pharmacy: none  If necessary, Name of Provider/Nurse Contacted: Hydro ,PharmD Clinical Pharmacist  01/26/2020  3:30 PM

## 2020-01-26 NOTE — Consult Note (Signed)
PHARMACY -  BRIEF ANTIBIOTIC NOTE   Pharmacy has received consult(s) for Cefepime/Vancomycin from an ED provider.  The patient's profile has been reviewed for ht/wt/allergies/indication/available labs.    One time order(s) placed for Vancomycin 1000mg  x 1 and Cefepime 2g x 1 by ED provider.  Pharmacy has ordered Vancomycin 1250mg  x 1 to be given in addition to 1000mg  dose to complete a total loading dose of 2250mg .  Further antibiotics/pharmacy consults should be ordered by admitting physician if indicated.                       Thank you,  Lu Duffel, PharmD, BCPS Clinical Pharmacist 01/26/2020 3:28 PM

## 2020-01-26 NOTE — H&P (Signed)
[facility] History and Physical   Patient: [name] QZR:[id number]   PCP: Kirk Ruths, MD DOB: 05/11/1950   DOA: 01/26/2020   DOS: 01/26/2020   DOS: the patient was seen and examined on 01/26/2020   Patient coming from: The patient is coming from Home  Chief Complaint: Left lower extremity weakness  HPI: [name] is a 69 y.o. female with Past medical history of HTN, COPD, OSA, multiple sclerosis, neurogenic bladder with chronic indwelling Foley catheter, depression, migraine, follicular lymphoma treated in the past, presented at [facility] ED due to weakness of left lower extremity since yesterday.  Patient stated that she walks with a walker at baseline and she has been to the rehab in the past but at home she was trying to walk yesterday but her left leg was not moving at all so she ended up coming to the ED.  Denied any shortness of breath, no chest pain or palpitations, no abdominal pain, no nausea vomiting or diarrhea.    ED Course: Patient had low-grade fever 99.8, blood pressure slightly elevated, Mild hyperglycemia, AKI on CKD stage IIIb creatinine slightly elevated 1.35, WBC count within normal range, chronic anemia hemoglobin stable 11.8 UA cloudy, WBC count elevated, urine culture pending, blood culture pending, Covid negative CT head negative for any acute changes Chest x-ray negative for any acute findings Abdominal x-ray negative for any acute findings Left hip x-ray negative for any acute findings   Review of Systems: as mentioned in the history of present illness.  All other systems reviewed and are negative.  Past Medical History:  Diagnosis Date  . Abdominal aortic atherosclerosis ([place]) 11/11/2016  . ADHD   . Anxiety   . COPD (chronic obstructive pulmonary disease) ([place])   . Depression    major depressive  . Dyspnea    doe  . Edema    left leg  . Follicular lymphoma ([place])    B Cell  . Follicular lymphoma grade II ([place])   . Hypertension   .  Hypotension    idiopathic  . Kyphoscoliosis and scoliosis 11/26/2011  . Morbid obesity ([place]) 01/05/2016  . Multiple sclerosis ([place])   . Multiple sclerosis ([place])    1980's  . Neuromuscular disorder ([place])   . Obstructive and reflux uropathy    foley  . Pain    atypical facial  . Peripheral vascular disease of lower extremity with ulceration ([place]) 11/08/2015  . Skin ulcer ([place]) 11/08/2015  . Weakness    generalized. has MS   Past Surgical History:  Procedure Laterality Date  . BACK SURGERY N/A 2002  . CYST EXCISION     lower back  . INGUINAL LYMPH NODE BIOPSY Left 07/04/2016   Procedure: INGUINAL LYMPH NODE BIOPSY;  Surgeon: Christene Lye, MD;  Location: [facility] ORS;  Service: General;  Laterality: Left;  . PORTACATH PLACEMENT N/A 07/22/2016   Procedure: INSERTION PORT-A-CATH;  Surgeon: Christene Lye, MD;  Location: [facility] ORS;  Service: General;  Laterality: N/A;  . TONSILLECTOMY AND ADENOIDECTOMY    . TUBAL LIGATION     Social History:  reports that she quit smoking about 3 years ago. Her smoking use included cigarettes. She started smoking about 24 years ago. She has a 20.00 pack-year smoking history. She has never used smokeless tobacco. She reports current drug use. Drug: Marijuana. She reports that she does not drink alcohol.  No Known Allergies   Family history reviewed and not pertinent Family History  Problem Relation Age  of Onset  . COPD Mother   . Diabetes Mother   . Heart failure Mother   . Alcohol abuse Father   . Kidney disease Father   . Kidney failure Father   . Arthritis Sister   . CAD Maternal Grandmother   . Stroke Maternal Grandfather   . Arthritis Sister   . Mental illness Sister   . Arthritis Brother      Prior to Admission medications   Medication Sig Start Date End Date Taking? Authorizing Provider  acetaminophen (TYLENOL) 500 MG tablet Take 1,000 mg by mouth every 8 (eight) hours as needed for moderate pain.   Yes [provider]   acidophilus (RISAQUAD) CAPS capsule Take 1 capsule by mouth 3 (three) times daily. 01/06/20  Yes Sheikh, Omair Latif, DO  amLODipine (NORVASC) 5 MG tablet Take 5 mg by mouth daily. 09/17/19  Yes [provider]  atorvastatin (LIPITOR) 10 MG tablet Take 10 mg by mouth at bedtime.  01/14/19  Yes [provider]  baclofen (LIORESAL) 10 MG tablet Take 10 mg by mouth 3 (three) times daily.  04/10/19  Yes [provider]  Biotin 1 MG CAPS Take by mouth. Take 500 mcg by mouth 3 (three) times daily   Yes [provider]  budesonide-formoterol (SYMBICORT) 160-4.5 MCG/ACT inhaler Inhale 2 puffs into the lungs 2 (two) times daily.    Yes [provider]  buPROPion (WELLBUTRIN XL) 300 MG 24 hr tablet Take 300 mg by mouth daily.  01/21/19  Yes [provider]  clonazePAM (KLONOPIN) 1 MG tablet Take 1 tablet (1 mg total) by mouth 2 (two) times daily as needed. 01/06/20  Yes Sheikh, Omair Latif, DO  cyanocobalamin 1000 MCG tablet Take 1,000 mcg by mouth daily.   Yes [provider]  docusate sodium (COLACE) 100 MG capsule Take 1 capsule (100 mg total) by mouth 2 (two) times daily as needed. 11/24/19  Yes Enzo Bi, MD  DULoxetine (CYMBALTA) 60 MG capsule Take 1 capsule (60 mg total) by mouth every morning. 10/14/16  Yes Vaughan Basta, MD  famotidine (PEPCID) 20 MG tablet Take 1 tablet (20 mg total) by mouth daily. 10/23/19  Yes Wieting, Richard, MD  gabapentin (NEURONTIN) 600 MG tablet Take 1 tablet (600 mg total) by mouth 3 (three) times daily. 02/09/17  Yes Mody, Ulice Bold, MD  hydrALAZINE (APRESOLINE) 50 MG tablet Take 50 mg by mouth 3 (three) times daily.   Yes [provider]  lisinopril (ZESTRIL) 20 MG tablet Take 20 mg by mouth daily.   Yes [provider]  magnesium oxide (MAG-OX) 400 (241.3 Mg) MG tablet Take 1 tablet (400 mg total) by mouth daily. 06/22/19  Yes Nolberto Hanlon, MD  Multiple Vitamin (MULTIVITAMIN WITH MINERALS) TABS  tablet Take 1 tablet by mouth daily.   Yes [provider]  MYRBETRIQ 50 MG TB24 tablet TAKE ONE TABLET BY MOUTH ONCE DAILY Patient taking differently: Take 50 mg by mouth daily.  03/08/19  Yes Stoioff, Ronda Fairly, MD  polyethylene glycol powder (GLYCOLAX/MIRALAX) 17 GM/SCOOP powder Take 17 g by mouth daily as needed for mild constipation. 05/01/19  Yes [provider]  QUEtiapine Fumarate (SEROQUEL XR) 150 MG 24 hr tablet Take 150 mg by mouth at bedtime.    Yes [provider]  Teriflunomide 14 MG TABS Take 14 mg by mouth daily. 12/03/19 03/02/20 Yes [provider]  TRINTELLIX 5 MG TABS tablet Take 5 mg by mouth daily. 12/15/19  Yes [provider]  Physical Exam: Vitals:   01/26/20 1446 01/26/20 1451 01/26/20 1452  BP:  (!) 151/69   Pulse:  94   Resp:  20   Temp:  99.8 F (37.7 C)   TempSrc:  Oral   SpO2: 100% 100%   Weight:   117.9 kg  Height:   5\' 3"  (1.6 m)    General: alert and oriented to time, place, and person. Appear in no distress, affect appropriate Eyes: PERRLA, Conjunctiva normal ENT: Oral Mucosa Clear, moist  Neck: no JVD, no Abnormal Mass Or lumps Cardiovascular: S1 and S2 Present, no Murmur, peripheral pulses symmetrical Respiratory: good respiratory effort, Bilateral Air entry equal, no signs of accessory muscle use, Clear to Auscultation, no Crackles, no wheezes Abdomen: Bowel Sound present, Soft and non tenderness, no hernia Skin: no rashes  Extremities: no Pedal edema, no calf tenderness Neurologic: Left lower extremity weakness, power 2 x 5, sensory deficit in the left lower extremity.  CN grossly intact and no any other focal deficits. Gait not checked due to patient safety concerns  Data Reviewed: I have personally reviewed and interpreted labs, imaging as discussed below.  CBC: Recent Labs  Lab 01/26/20 1503  WBC 9.3  NEUTROABS 7.7  HGB 11.8*  HCT 36.3  MCV 87.1  PLT 250*   Basic Metabolic Panel: Recent  Labs  Lab 01/26/20 1503  NA 135  K 3.9  CL 100  CO2 26  GLUCOSE 131*  BUN 18  CREATININE 1.35*  CALCIUM 9.0   GFR: Estimated Creatinine Clearance: 48.8 mL/min (A) (by C-G formula based on SCr of 1.35 mg/dL (H)). Liver Function Tests: Recent Labs  Lab 01/26/20 1503  AST 15  ALT 16  ALKPHOS 66  BILITOT 0.6  PROT 7.5  ALBUMIN 3.9   No results for input(s): LIPASE, AMYLASE in the last 168 hours. No results for input(s): AMMONIA in the last 168 hours. Coagulation Profile: Recent Labs  Lab 01/26/20 1503  INR 1.0   Cardiac Enzymes: No results for input(s): CKTOTAL, CKMB, CKMBINDEX, TROPONINI in the last 168 hours. BNP (last 3 results) No results for input(s): PROBNP in the last 8760 hours. HbA1C: No results for input(s): HGBA1C in the last 72 hours. CBG: No results for input(s): GLUCAP in the last 168 hours. Lipid Profile: No results for input(s): CHOL, HDL, LDLCALC, TRIG, CHOLHDL, LDLDIRECT in the last 72 hours. Thyroid Function Tests: No results for input(s): TSH, T4TOTAL, FREET4, T3FREE, THYROIDAB in the last 72 hours. Anemia Panel: No results for input(s): VITAMINB12, FOLATE, FERRITIN, TIBC, IRON, RETICCTPCT in the last 72 hours. Urine analysis:    Component Value Date/Time   COLORURINE YELLOW (A) 01/26/2020 2002   APPEARANCEUR CLOUDY (A) 01/26/2020 2002   APPEARANCEUR Hazy 08/02/2013 0020   LABSPEC 1.021 01/26/2020 2002   LABSPEC 1.012 08/02/2013 0020   PHURINE 5.0 01/26/2020 2002   GLUCOSEU NEGATIVE 01/26/2020 2002   GLUCOSEU Negative 08/02/2013 0020   HGBUR SMALL (A) 01/26/2020 2002   BILIRUBINUR NEGATIVE 01/26/2020 2002   BILIRUBINUR Negative 08/02/2013 0020   KETONESUR NEGATIVE 01/26/2020 2002   PROTEINUR 100 (A) 01/26/2020 2002   NITRITE NEGATIVE 01/26/2020 2002   LEUKOCYTESUR LARGE (A) 01/26/2020 2002   LEUKOCYTESUR 2+ 08/02/2013 0020    Radiological Exams on Admission: CT HEAD WO CONTRAST  Result Date: 01/26/2020 CLINICAL DATA:  Weakness.  EXAM: CT HEAD WITHOUT CONTRAST TECHNIQUE: Contiguous axial images were obtained from the base of the skull through the vertex without intravenous contrast. COMPARISON:  June 20, 2019 FINDINGS: Brain:  There is moderate severity cerebral atrophy with widening of the extra-axial spaces and ventricular dilatation. There are areas of decreased attenuation within the white matter tracts of the supratentorial brain, consistent with microvascular disease changes. Vascular: No hyperdense vessel or unexpected calcification. Skull: Normal. Negative for fracture or focal lesion. Sinuses/Orbits: No acute finding. Other: None. IMPRESSION: 1. Generalized cerebral atrophy. 2. No acute intracranial abnormality. Electronically Signed   By: Virgina Norfolk M.D.   On: 01/26/2020 16:27   DG Chest Port 1 View  Result Date: 01/26/2020 CLINICAL DATA:  Weakness. EXAM: PORTABLE CHEST 1 VIEW COMPARISON:  November 19, 2019 FINDINGS: Mild diffuse chronic appearing increased lung markings are seen. There is no evidence of acute infiltrate, pleural effusion or pneumothorax. The heart size and mediastinal contours are within normal limits. The visualized skeletal structures are unremarkable. IMPRESSION: No acute or active cardiopulmonary disease. Electronically Signed   By: Virgina Norfolk M.D.   On: 01/26/2020 16:24   DG Abd 2 Views  Result Date: 01/26/2020 CLINICAL DATA:  Left hip pain, weakness, fall EXAM: X-RAY ABDOMEN 2 VIEWS COMPARISON:  None. FINDINGS: Nonobstructive bowel gas pattern. No free air or organomegaly. No confluent opacity within the lungs. No effusions. Heart is borderline in size. Scoliosis and degenerative changes in the thoracolumbar spine. Posterior fusion changes in the lower lumbar spine. IMPRESSION: No acute findings. Electronically Signed   By: Rolm Baptise M.D.   On: 01/26/2020 16:45   DG Hip Unilat W or Wo Pelvis 2-3 Views Left  Result Date: 01/26/2020 CLINICAL DATA:  Left hip pain after fall EXAM: DG  HIP (WITH OR WITHOUT PELVIS) 2-3V LEFT COMPARISON:  None. FINDINGS: No acute bony abnormality. Specifically, no fracture, subluxation, or dislocation. IMPRESSION: No acute bony abnormality. Electronically Signed   By: Rolm Baptise M.D.   On: 01/26/2020 16:44    I reviewed all nursing notes, pharmacy notes, vitals, pertinent old records.  Assessment/Plan  Principal Problem:   Weakness of left leg Active Problems:   COPD with bronchial hyperresponsiveness (HCC)   Essential hypertension   Neurogenic bladder   Multiple sclerosis (HCC)   UTI (urinary tract infection)   Left leg weakness   #Left lower extremity weakness could be secondary to MS flareup Patient was given Solu-Medrol 1 g IV in the ED, will continue same dose daily Patient remains at high risk for hypoglycemia so started on Humalog sliding scale, monitor FSBG Resumed home medication teriflunomide Follow PT/OT eval Neurology consult please call tomorrow a.m.   #UTI, could be secondary to chronic indwelling Foley catheter Patient has neurogenic bladder.  Foley catheter was replaced in the ED UA positive, patient had low-grade fever Patient was given antibiotics cefepime and vancomycin in the ED Started ceftriaxone 1 g IV daily Resumed Myrbetriq Follow urine culture and blood culture   #AKI on CKD stage III, creatinine slightly elevated 1.35 IV fluid given for overnight hydration, reassess tomorrow a.m.   # Hypertension, pressure slightly elevated, continue to monitor Resume home medications amlodipine, lisinopril, hydralazine Monitor renal functions, if deteriorated then discontinue lisinopril   #COPD, OSA, patient does not wear CPAP at home No exacerbation noted on exam Continue to monitor.  # Depression and migraine, no acute issues Resumed home medications.    Nutrition: Cardiac diet DVT Prophylaxis: Subcutaneous Lovenox  Advance goals of care discussion: Full code   Consults: Neurology consult,  please call tomorrow a.m.   Family Communication: family was notpresent at bedside, at the time of interview.   Disposition: Admitted as  inpatient, med-surge unit. Likely to be discharged SNF, in 2-3 days .  I have discussed plan of care as described above with RN and patient/family.  Severity of Illness: The appropriate patient status for this patient is INPATIENT. Inpatient status is judged to be reasonable and necessary in order to provide the required intensity of service to ensure the patient's safety. The patient's presenting symptoms, physical exam findings, and initial radiographic and laboratory data in the context of their chronic comorbidities is felt to place them at high risk for further clinical deterioration. Furthermore, it is not anticipated that the patient will be medically stable for discharge from the hospital within 2 midnights of admission. The following factors support the patient status of inpatient.   " The patient's presenting symptoms include left lower extremity weakness. " The worrisome physical exam findings include possible MS flareup. " The chronic co-morbidities include walks with walker at baseline.   * I certify that at the point of admission it is my clinical judgment that the patient will require inpatient hospital care spanning beyond 2 midnights from the point of admission due to high intensity of service, high risk for further deterioration and high frequency of surveillance required.*    Author: Val Riles, MD [facility] 01/26/2020 9:52 PM   To reach On-call, see care teams to locate the attending and reach out to them via [email or web address]. If 7PM-7AM, please contact night-coverage If you still have difficulty reaching the attending provider, please page the [facility] (Director on Call) for [facility] on amion for assistance.

## 2020-01-26 NOTE — ED Triage Notes (Signed)
Pt to ED via EMS c/o weakness. Temp 102.3, hr 111. Hx UTI, sepsis.

## 2020-01-26 NOTE — ED Notes (Signed)
Blood culture set 1 collected

## 2020-01-26 NOTE — Sepsis Progress Note (Signed)
Notified bedside nurse of need to draw blood cultures.  

## 2020-01-26 NOTE — ED Notes (Signed)
Pt reports current foley in place for >1 month. Foley changed

## 2020-01-27 ENCOUNTER — Inpatient Hospital Stay: Payer: Medicare Other

## 2020-01-27 DIAGNOSIS — I10 Essential (primary) hypertension: Secondary | ICD-10-CM | POA: Diagnosis not present

## 2020-01-27 DIAGNOSIS — R29898 Other symptoms and signs involving the musculoskeletal system: Secondary | ICD-10-CM

## 2020-01-27 LAB — GLUCOSE, CAPILLARY
Glucose-Capillary: 125 mg/dL — ABNORMAL HIGH (ref 70–99)
Glucose-Capillary: 130 mg/dL — ABNORMAL HIGH (ref 70–99)
Glucose-Capillary: 145 mg/dL — ABNORMAL HIGH (ref 70–99)
Glucose-Capillary: 154 mg/dL — ABNORMAL HIGH (ref 70–99)
Glucose-Capillary: 202 mg/dL — ABNORMAL HIGH (ref 70–99)

## 2020-01-27 LAB — TSH: TSH: 1.153 u[IU]/mL (ref 0.350–4.500)

## 2020-01-27 LAB — BASIC METABOLIC PANEL
Anion gap: 10 (ref 5–15)
BUN: 21 mg/dL (ref 8–23)
CO2: 21 mmol/L — ABNORMAL LOW (ref 22–32)
Calcium: 8.5 mg/dL — ABNORMAL LOW (ref 8.9–10.3)
Chloride: 104 mmol/L (ref 98–111)
Creatinine, Ser: 1.57 mg/dL — ABNORMAL HIGH (ref 0.44–1.00)
GFR calc Af Amer: 39 mL/min — ABNORMAL LOW (ref >60)
GFR calc non Af Amer: 33 mL/min — ABNORMAL LOW (ref >60)
Glucose, Bld: 210 mg/dL — ABNORMAL HIGH (ref 70–99)
Potassium: 4 mmol/L (ref 3.5–5.1)
Sodium: 135 mmol/L (ref 135–145)

## 2020-01-27 LAB — HEPATIC FUNCTION PANEL
ALT: 14 U/L (ref 0–44)
AST: 18 U/L (ref 15–41)
Albumin: 3.3 g/dL — ABNORMAL LOW (ref 3.5–5.0)
Alkaline Phosphatase: 53 U/L (ref 38–126)
Bilirubin, Direct: 0.1 mg/dL (ref 0.0–0.2)
Indirect Bilirubin: 0.6 mg/dL (ref 0.3–0.9)
Total Bilirubin: 0.7 mg/dL (ref 0.3–1.2)
Total Protein: 6.7 g/dL (ref 6.5–8.1)

## 2020-01-27 LAB — CBC
HCT: 30.8 % — ABNORMAL LOW (ref 36.0–46.0)
Hemoglobin: 9.9 g/dL — ABNORMAL LOW (ref 12.0–15.0)
MCH: 28.5 pg (ref 26.0–34.0)
MCHC: 32.1 g/dL (ref 30.0–36.0)
MCV: 88.8 fL (ref 80.0–100.0)
Platelets: 124 10*3/uL — ABNORMAL LOW (ref 150–400)
RBC: 3.47 MIL/uL — ABNORMAL LOW (ref 3.87–5.11)
RDW: 15 % (ref 11.5–15.5)
WBC: 7.5 10*3/uL (ref 4.0–10.5)
nRBC: 0 % (ref 0.0–0.2)

## 2020-01-27 LAB — URINE CULTURE

## 2020-01-27 LAB — HEMOGLOBIN A1C
Hgb A1c MFr Bld: 5.4 % (ref 4.8–5.6)
Mean Plasma Glucose: 108.28 mg/dL

## 2020-01-27 MED ORDER — BENZOCAINE 10 % MT GEL
Freq: Two times a day (BID) | OROMUCOSAL | Status: DC | PRN
  Filled 2020-01-27 (×3): qty 9

## 2020-01-27 MED ORDER — GADOBUTROL 1 MMOL/ML IV SOLN
10.0000 mL | Freq: Once | INTRAVENOUS | Status: AC | PRN
  Administered 2020-01-27: 10 mL via INTRAVENOUS
  Filled 2020-01-27: qty 10

## 2020-01-27 MED ORDER — BACLOFEN 10 MG PO TABS
10.0000 mg | ORAL_TABLET | Freq: Every day | ORAL | Status: DC
  Administered 2020-01-28 – 2020-01-31 (×4): 10 mg via ORAL
  Filled 2020-01-27 (×4): qty 1

## 2020-01-27 MED ORDER — SODIUM CHLORIDE 0.9 % IV SOLN
2.0000 g | INTRAVENOUS | Status: AC
  Administered 2020-01-27 – 2020-01-31 (×5): 2 g via INTRAVENOUS
  Filled 2020-01-27 (×3): qty 20
  Filled 2020-01-27: qty 2
  Filled 2020-01-27: qty 20

## 2020-01-27 MED ORDER — SODIUM CHLORIDE 0.9 % IV BOLUS
500.0000 mL | Freq: Once | INTRAVENOUS | Status: AC
  Administered 2020-01-27: 500 mL via INTRAVENOUS

## 2020-01-27 MED ORDER — BACLOFEN 10 MG PO TABS
15.0000 mg | ORAL_TABLET | Freq: Two times a day (BID) | ORAL | Status: DC
  Administered 2020-01-28 – 2020-02-03 (×14): 15 mg via ORAL
  Filled 2020-01-27 (×17): qty 1.5

## 2020-01-27 NOTE — ED Notes (Signed)
Pt given phone for MRI screening.

## 2020-01-27 NOTE — ED Notes (Signed)
Pt has approx 444mL left in ns bolus

## 2020-01-27 NOTE — ED Notes (Signed)
Missing baclofen and risaquad; pharm notified. Will give once received from pharm.

## 2020-01-27 NOTE — ED Notes (Signed)
cbg 154

## 2020-01-27 NOTE — Evaluation (Signed)
Occupational Therapy Evaluation Patient Details Name: [name] MRN: [id number] DOB: 1950/12/07 Today's Date: 01/27/2020    History of Present Illness [name] is a 89yoF who comes [facility] on 9/29 c LLE weakness. Pt thought to have presentation 2/2 MS flare up. PMH: HTN, COPD< OSA, MS, neurogenic bladder, chronic foley, depression, migraine, follicular lymphoma.   Clinical Impression   Patient presenting with decreased I in self care, balance, endurance, functional transfers, and strength.  Patient reports being mod I PTA but upon further questioning pt endorses at least 10 falls this year. Pt only ambulates ~ 10 feet from wheelchair >bathroom and spends most of her time in wheelchair watching TV. Pt needing assistance donning B socks, shoes, bra, and threading pants onto feet for dressing tasks. Patient currently functioning at mod - max A for self care tasks. Pt needing +2 assistance for safety seated on EOB. Pt appears to have lack of insight into current condition/situation in regards to safety and returning home.  Patient will benefit from acute OT to increase overall independence in the areas of ADLs, functional mobility, and safety awareness in order to safely discharge to next venue of care.    Follow Up Recommendations  SNF    Equipment Recommendations  Other (comment) (defer to next venue of care)    Recommendations for Other Services Other (comment) (none at this time)     Precautions / Restrictions Precautions Precautions: Fall      Mobility Bed Mobility Overal bed mobility: Needs Assistance Bed Mobility: Supine to Sit;Sit to Supine Rolling: Min guard   Supine to sit: Max assist;+2 for safety/equipment Sit to supine: Max assist;+2 for physical assistance      Transfers Overall transfer level:  (deferred, LLE is to weak to safely attempt)      General transfer comment: deferred for safety    Balance Overall balance assessment: Needs  assistance Sitting-balance support: Feet supported;Bilateral upper extremity supported Sitting balance-Leahy Scale: Good Sitting balance - Comments: no LOB in sitting         ADL either performed or assessed with clinical judgement   ADL Overall ADL's : Needs assistance/impaired Eating/Feeding: Independent   Grooming: Wash/dry hands;Wash/dry face;Oral care;Set up;Sitting          Lower Body Dressing: Moderate assistance     General ADL Comments: rolling L <> R with min A. grooming with set up A.OT anticipates pt needing mod - max A for LB self care.     Vision Patient Visual Report: No change from baseline              Pertinent Vitals/Pain Pain Assessment: 0-10 Pain Score: 9  Pain Location: Left leg pain from hip to foot Pain Descriptors / Indicators: Cramping;Grimacing;Guarding Pain Intervention(s): Limited activity within patient's tolerance;Monitored during session;Repositioned     Hand Dominance Right   Extremity/Trunk Assessment Upper Extremity Assessment Upper Extremity Assessment: Overall WFL for tasks assessed   Lower Extremity Assessment Lower Extremity Assessment: LLE deficits/detail LLE Deficits / Details: Trace activation of leg as a whole, only 10% limb movement at ankle, knee, hip; severe pain with active mobility   Cervical / Trunk Assessment Cervical / Trunk Assessment: Kyphotic   Communication Communication Communication: No difficulties   Cognition Arousal/Alertness: Awake/alert Behavior During Therapy: WFL for tasks assessed/performed Overall Cognitive Status: No family/caregiver present to determine baseline cognitive functioning    General Comments: Alert and oriented but with poor insight to deficits and safety concerns. She follows 1 step commands consistently.  Home Living Family/patient expects to be discharged to:: Private residence Living Arrangements: Spouse/significant other Available Help at Discharge:  Family;Available 24 hours/day Type of Home: House Home Access: Stairs to enter;Ramped entrance Entrance Stairs-Number of Steps: has a ramp, and then up two more steps (has a grab handle on the L) Entrance Stairs-Rails: None Home Layout: One level     Bathroom Shower/Tub: Occupational psychologist: Standard Bathroom Accessibility: Yes How Accessible: Accessible via walker Home Equipment: Wheelchair - manual;Walker - 4 wheels;Grab bars - tub/shower;Other (comment);Shower seat;Bedside commode          Prior Functioning/Environment Level of Independence: Needs assistance  Gait / Transfers Assistance Needed: rollator or WC for household mobility; sleepsin lift chair ADL's / Homemaking Assistance Needed: Husbands assists with all IADLs and some ADLs including bathing (drying off) and some dressing (bra and socks)   Comments: only able to complete home distances with 4WW or w/c Fearful of falling. Endorses 10 falls in past six month (less falls recently due to being more careful).        OT Problem List: Decreased strength;Decreased coordination;Decreased activity tolerance;Decreased safety awareness;Decreased knowledge of use of DME or AE;Impaired balance (sitting and/or standing);Decreased knowledge of precautions      OT Treatment/Interventions: Self-care/ADL training;Therapeutic exercise;Therapeutic activities;Patient/family education;Balance training;DME and/or AE instruction;Energy conservation    OT Goals(Current goals can be found in the care plan section) Acute Rehab OT Goals Patient Stated Goal: decrease leg pain OT Goal Formulation: With patient Time For Goal Achievement: 02/10/20 Potential to Achieve Goals: Good ADL Goals Pt Will Perform Grooming: with modified independence;sitting Pt Will Transfer to Toilet: with min guard assist;ambulating;bedside commode Pt Will Perform Toileting - Clothing Manipulation and hygiene: with min guard assist;sit to/from stand  OT  Frequency: Min 1X/week   Barriers to D/C: Other (comment)  none at this time          AM-PAC OT "6 Clicks" Daily Activity     Outcome Measure Help from another person eating meals?: None Help from another person taking care of personal grooming?: A Little Help from another person toileting, which includes using toliet, bedpan, or urinal?: Total Help from another person bathing (including washing, rinsing, drying)?: A Lot Help from another person to put on and taking off regular upper body clothing?: A Little Help from another person to put on and taking off regular lower body clothing?: A Lot 6 Click Score: 15   End of Session Nurse Communication: Mobility status  Activity Tolerance: Patient tolerated treatment well Patient left: in bed;with call bell/phone within reach  OT Visit Diagnosis: Unsteadiness on feet (R26.81);Repeated falls (R29.6);History of falling (Z91.81);Muscle weakness (generalized) (M62.81)                Time: 2800-3491 OT Time Calculation (min): 26 min Charges:  OT General Charges $OT Visit: 1 Visit OT Evaluation $OT Eval Moderate Complexity: 1 Mod OT Treatments $Self Care/Home Management : 8-22 mins  Darleen Crocker, MS, OTR/L , CBIS ascom [phone]  01/27/20, 12:57 PM

## 2020-01-27 NOTE — ED Notes (Signed)
[name] Settler, np notified of continued hypotension, states will come and evaluate pt.

## 2020-01-27 NOTE — Progress Notes (Signed)
PROGRESS NOTE    Patient: [name]                            PCP: Kirk Ruths, MD                    DOB: [birth year]            DOA: 01/26/2020 FVC:[id number]             DOS: 01/27/2020, 7:20 AM   LOS: 1 day   Date of Service: The patient was seen and examined on 01/27/2020  Subjective:   The patient was seen and examined this Am. Stable, reporting her right leg weakness has mildly improved but still having discomfort and weakness Otherwise no issues overnight .  Brief Narrative:   ADRYANA MOGENSEN is a 69 y.o.F w PMh/o HTN, COPD, OSA, multiple sclerosis, neurogenic bladder with chronic indwelling Foley catheter, depression, migraine, follicular lymphoma treated in the past, presented at [facility] ED due to weakness of left lower extremity.   ED: Vitals were normal, LABs: Chronic anemia hemoglobin stable 11.8 UA cloudy, WBC count elevated, urine culture pending, blood culture pending, Covid negative CT head negative for any acute changes Chest x-ray negative for any acute findings Abdominal x-ray negative for any acute findings Left hip x-ray negative for any acute findings  Assessment & Plan:   Principal Problem:   Weakness of left leg Active Problems:   UTI (urinary tract infection)   COPD with bronchial hyperresponsiveness (HCC)   Essential hypertension   Neurogenic bladder   Multiple sclerosis (HCC)   Left leg weakness   Left lower extremity weakness could be secondary to MS flareup -Reporting right lower extremity strength and numbness has improved but still uncomfortable and weak Patient was given Solu-Medrol 1 g IV in the ED, will continue same dose daily Patient remains at high risk for hypoglycemia so started on Humalog sliding scale, monitor CBGs Resumed home medication teriflunomide Follow PT/OT eval Neurology Dr. Doy Mince consulted appreciate input   UTI,  -Chronic indwelling catheter-follow-up with urologist as an outpatient -Hemodynamically  stable Patient has neurogenic bladder.  Foley catheter was replaced in the ED UA positive, patient had low-grade fever Patient was given antibiotics cefepime and vancomycin in the ED Started ceftriaxone 1 g IV daily.. will cont.  Resumed Myrbetriq Follow urine culture and blood culture   AKI on CKD stage III,  creatinine slightly elevated 1.35 IV fluid given for overnight hydration, reassess tomorrow a.m. Cr; 1.35 >> 1.57 Avoiding nephrotoxins Monitoring  Hypertension,  -Remained stable, BP improved -continue to monitor Resume home medications amlodipine, lisinopril, hydralazine Monitor renal functions, if deteriorated then discontinue lisinopril   COPD, OSA, patient does not wear CPAP at home No exacerbation noted on exam Continue to monitor. -Stable   Depression and migraine, no acute issues Resumed home medications.   Nutritional status:         Cultures; Urine Culture  >>>   Antimicrobials: 9/30/21cefepime and vancomycin in the ED 01/27/20 Started ceftriaxone 1 g IV daily    Consultants: Neuro    ---------------------------------------------------------------------------------------------------------------------------------------------  DVT prophylaxis:  SCD/Compression stockings and Lovenox SQ Code Status:   Code Status: Full Code Family Communication: No family member present at bedside- attempt will be made to update daily The above findings and plan of care has been discussed with patient (and family )  in detail,  they expressed understanding and agreement of above. -  Advance care planning has been discussed.   Admission status:    Status is: Inpatient  Remains inpatient appropriate because:Inpatient level of care appropriate due to severity of illness   Dispo: The patient is from: Home              Anticipated d/c is to: Home              Anticipated d/c date is: 5 days               Patient currently is not medically stable to  d/c.        Procedures:   No admission procedures for hospital encounter.     Antimicrobials:  Anti-infectives (From admission, onward)   Start     Dose/Rate Route Frequency Ordered Stop   01/27/20 0900  cefTRIAXone (ROCEPHIN) 1 g in sodium chloride 0.9 % 100 mL IVPB  Status:  Discontinued        1 g 200 mL/hr over 30 Minutes Intravenous Every 24 hours 01/26/20 2202 01/27/20 0449   01/27/20 0900  cefTRIAXone (ROCEPHIN) 2 g in sodium chloride 0.9 % 100 mL IVPB        2 g 200 mL/hr over 30 Minutes Intravenous Every 24 hours 01/27/20 0449     01/26/20 1630  vancomycin (VANCOREADY) IVPB 1250 mg/250 mL        1,250 mg 166.7 mL/hr over 90 Minutes Intravenous  Once 01/26/20 1526 01/26/20 2022   01/26/20 1500  ceFEPIme (MAXIPIME) 2 g in sodium chloride 0.9 % 100 mL IVPB        2 g 200 mL/hr over 30 Minutes Intravenous  Once 01/26/20 1459 01/26/20 1638   01/26/20 1500  metroNIDAZOLE (FLAGYL) IVPB 500 mg        500 mg 100 mL/hr over 60 Minutes Intravenous  Once 01/26/20 1459 01/26/20 1801   01/26/20 1500  vancomycin (VANCOCIN) IVPB 1000 mg/200 mL premix        1,000 mg 200 mL/hr over 60 Minutes Intravenous  Once 01/26/20 1459 01/26/20 2227       Medication:  . acidophilus  1 capsule Oral TID  . amLODipine  5 mg Oral Daily  . atorvastatin  10 mg Oral QHS  . baclofen  10 mg Oral TID  . buPROPion  300 mg Oral Daily  . DULoxetine  60 mg Oral BH-[id number]  . enoxaparin (LOVENOX) injection  40 mg Subcutaneous Q24H  . famotidine  20 mg Oral Daily  . fluticasone furoate-vilanterol  1 puff Inhalation Daily  . gabapentin  600 mg Oral TID  . insulin aspart  0-15 Units Subcutaneous TID WC  . insulin aspart  0-5 Units Subcutaneous QHS  . magnesium oxide  400 mg Oral Daily  . mirabegron ER  50 mg Oral Daily  . multivitamin with minerals  1 tablet Oral Daily  . QUEtiapine Fumarate  150 mg Oral QHS  . sodium chloride flush  3 mL Intravenous Q12H  . sodium chloride flush  3 mL Intravenous  Q12H  . Teriflunomide  14 mg Oral Daily  . cyanocobalamin  1,000 mcg Oral Daily  . vortioxetine HBr  5 mg Oral Daily    sodium chloride, acetaminophen **OR** acetaminophen, acetaminophen, clonazePAM, docusate sodium, hydrALAZINE, ondansetron **OR** ondansetron (ZOFRAN) IV, polyethylene glycol, polyethylene glycol powder, sodium chloride flush   Objective:   Vitals:   01/27/20 0430 01/27/20 0500 01/27/20 0600 01/27/20 0630  BP: (!) 91/52 (!) 99/53 (!) 86/46 (!) 90/51  Pulse: 78 67  65 (!) 55  Resp: 20 (!) 24 (!) 21 15  Temp:      TempSrc:      SpO2: 94% 94% 92% 93%  Weight:      Height:        Intake/Output Summary (Last 24 hours) at 01/27/2020 0720 Last data filed at 01/27/2020 0600 Gross per 24 hour  Intake --  Output 400 ml  Net -400 ml   Filed Weights   01/26/20 1452  Weight: 117.9 kg     Examination:   Physical Exam  Constitution:  Alert, cooperative, no distress,  Appears calm and comfortable  Psychiatric: Normal and stable mood and affect, cognition intact,   HEENT: Normocephalic, PERRL, otherwise with in Normal limits  Chest:Chest symmetric Cardio vascular:  S1/S2, RRR, No murmure, No Rubs or Gallops  pulmonary: Clear to auscultation bilaterally, respirations unlabored, negative wheezes / crackles Abdomen: Soft, non-tender, non-distended, bowel sounds,no masses, no organomegaly Muscular skeletal: Limited exam - in bed, able to move all 4 extremities, Normal strength,  Neuro: CNII-XII intact. , normal motor and sensation, reflexes intact  Extremities: No pitting edema lower extremities, +2 pulses  Skin: Dry, warm to touch, negative for any Rashes, No open wounds Wounds: per nursing documentation Pressure Injury 10/20/19 Buttocks Left Stage 2 -  Partial thickness loss of dermis presenting as a shallow open injury with a red, pink wound bed without slough. (Active)  10/20/19 2125  Location: Buttocks  Location Orientation: Left  Staging: Stage 2 -  Partial  thickness loss of dermis presenting as a shallow open injury with a red, pink wound bed without slough.  Wound Description (Comments):   Present on Admission:      Pressure Injury 10/20/19 Coccyx Mid Stage 2 -  Partial thickness loss of dermis presenting as a shallow open injury with a red, pink wound bed without slough. (Active)  10/20/19 2125  Location: Coccyx  Location Orientation: Mid  Staging: Stage 2 -  Partial thickness loss of dermis presenting as a shallow open injury with a red, pink wound bed without slough.  Wound Description (Comments):   Present on Admission:     ------------------------------------------------------------------------------------------------------------------------------------------    LABs:  CBC Latest Ref Rng & Units 01/27/2020 01/26/2020 01/06/2020  WBC 4.0 - 10.5 K/uL 7.5 9.3 4.2  Hemoglobin 12.0 - 15.0 g/dL 9.9(L) 11.8(L) 10.3(L)  Hematocrit 36 - 46 % 30.8(L) 36.3 31.2(L)  Platelets 150 - 400 K/uL 124(L) 148(L) 199   CMP Latest Ref Rng & Units 01/27/2020 01/26/2020 01/06/2020  Glucose 70 - 99 mg/dL 210(H) 131(H) 96  BUN 8 - 23 mg/dL 21 18 13   Creatinine 0.44 - 1.00 mg/dL 1.57(H) 1.35(H) 0.91  Sodium 135 - 145 mmol/L 135 135 138  Potassium 3.5 - 5.1 mmol/L 4.0 3.9 3.9  Chloride 98 - 111 mmol/L 104 100 103  CO2 22 - 32 mmol/L 21(L) 26 25  Calcium 8.9 - 10.3 mg/dL 8.5(L) 9.0 9.0  Total Protein 6.5 - 8.1 g/dL 6.7 7.5 6.3(L)  Total Bilirubin 0.3 - 1.2 mg/dL 0.7 0.6 0.7  Alkaline Phos 38 - 126 U/L 53 66 58  AST 15 - 41 U/L 18 15 26   ALT 0 - 44 U/L 14 16 25        Micro Results Recent Results (from the past 240 hour(s))  Blood Culture (routine x 2)     Status: None (Preliminary result)   Collection Time: 01/26/20  3:04 PM   Specimen: BLOOD  Result Value Ref Range Status  Specimen Description BLOOD RIGHT ANTECUBITAL  Final   Special Requests   Final    BOTTLES DRAWN AEROBIC AND ANAEROBIC Blood Culture adequate volume   Culture   Final    NO  GROWTH < 24 HOURS Performed at [facility], [address] [place], [place] [address]    Report Status PENDING  Incomplete  Blood Culture (routine x 2)     Status: None (Preliminary result)   Collection Time: 01/26/20  3:04 PM   Specimen: BLOOD  Result Value Ref Range Status   Specimen Description BLOOD RIGHT ANTECUBITAL  Final   Special Requests   Final    BOTTLES DRAWN AEROBIC AND ANAEROBIC Blood Culture adequate volume   Culture   Final    NO GROWTH < 24 HOURS Performed at [facility], [address], [place], [place] [address]    Report Status PENDING  Incomplete  Respiratory Panel by RT PCR (Flu A&B, Covid) - Nasopharyngeal Swab     Status: None   Collection Time: 01/26/20  5:36 PM   Specimen: Nasopharyngeal Swab  Result Value Ref Range Status   SARS Coronavirus 2 by RT PCR NEGATIVE NEGATIVE Final    Comment: (NOTE) SARS-CoV-2 target nucleic acids are NOT DETECTED.  The SARS-CoV-2 RNA is generally detectable in upper respiratoy specimens during the acute phase of infection. The lowest concentration of SARS-CoV-2 viral copies this assay can detect is 131 copies/mL. A negative result does not preclude SARS-Cov-2 infection and should not be used as the sole basis for treatment or other patient management decisions. A negative result may occur with  improper specimen collection/handling, submission of specimen other than nasopharyngeal swab, presence of viral mutation(s) within the areas targeted by this assay, and inadequate number of viral copies (<131 copies/mL). A negative result must be combined with clinical observations, patient history, and epidemiological information. The expected result is Negative.  Fact Sheet for Patients:  PinkCheek.be  Fact Sheet for Healthcare Providers:  GravelBags.it  This test is no t yet approved or cleared by the [place] FDA and  has been authorized  for detection and/or diagnosis of SARS-CoV-2 by FDA under an Emergency Use Authorization (EUA). This EUA will remain  in effect (meaning this test can be used) for the duration of the COVID-19 declaration under Section 564(b)(1) of the Act, 21 U.S.C. section 360bbb-3(b)(1), unless the authorization is terminated or revoked sooner.     Influenza A by PCR NEGATIVE NEGATIVE Final   Influenza B by PCR NEGATIVE NEGATIVE Final    Comment: (NOTE) The Xpert Xpress SARS-CoV-2/FLU/RSV assay is intended as an aid in  the diagnosis of influenza from Nasopharyngeal swab specimens and  should not be used as a sole basis for treatment. Nasal washings and  aspirates are unacceptable for Xpert Xpress SARS-CoV-2/FLU/RSV  testing.  Fact Sheet for Patients: PinkCheek.be  Fact Sheet for Healthcare Providers: GravelBags.it  This test is not yet approved or cleared by the [place] FDA and  has been authorized for detection and/or diagnosis of SARS-CoV-2 by  FDA under an Emergency Use Authorization (EUA). This EUA will remain  in effect (meaning this test can be used) for the duration of the  Covid-19 declaration under Section 564(b)(1) of the Act, 21  U.S.C. section 360bbb-3(b)(1), unless the authorization is  terminated or revoked. Performed at [facility], [address] [place], [place] [address]     Radiology Reports CT HEAD WO CONTRAST  Result Date: 01/26/2020 CLINICAL DATA:  Weakness.  EXAM: CT HEAD WITHOUT CONTRAST TECHNIQUE: Contiguous axial images were obtained from the base of the skull through the vertex without intravenous contrast. COMPARISON:  June 20, 2019 FINDINGS: Brain: There is moderate severity cerebral atrophy with widening of the extra-axial spaces and ventricular dilatation. There are areas of decreased attenuation within the white matter tracts of the supratentorial brain, consistent with microvascular  disease changes. Vascular: No hyperdense vessel or unexpected calcification. Skull: Normal. Negative for fracture or focal lesion. Sinuses/Orbits: No acute finding. Other: None. IMPRESSION: 1. Generalized cerebral atrophy. 2. No acute intracranial abnormality. Electronically Signed   By: Virgina Norfolk M.D.   On: 01/26/2020 16:27   CT ABDOMEN PELVIS W CONTRAST  Result Date: 12/31/2019 CLINICAL DATA:  Chronic Foley catheter in place.  Fever. EXAM: CT ABDOMEN AND PELVIS WITH CONTRAST TECHNIQUE: Multidetector CT imaging of the abdomen and pelvis was performed using the standard protocol following bolus administration of intravenous contrast. CONTRAST:  168mL OMNIPAQUE IOHEXOL 300 MG/ML  SOLN COMPARISON:  10/17/2019 FINDINGS: Lower chest: No acute abnormality. Hepatobiliary: No focal hepatic abnormality. Gallbladder unremarkable. Pancreas: No focal abnormality or ductal dilatation. Spleen: No focal abnormality.  Normal size. Adrenals/Urinary Tract: Mild fullness of the renal collecting systems bilaterally. Foley catheter in place within the bladder which is decompressed. Adrenal glands unremarkable. Stomach/Bowel: Stomach, large and small bowel grossly unremarkable. Vascular/Lymphatic: Calcified aorta and iliac vessels. No evidence of aneurysm or adenopathy. Reproductive: 6 cm septated cyst within the left ovary, stable since prior study. Uterus and right ovary unremarkable. Other: No free fluid or free air. Musculoskeletal: Postoperative changes and degenerative changes in the lumbar spine. No acute bony abnormality. IMPRESSION: Fullness of the renal collecting systems bilaterally, similar to prior study. No stones or overt hydronephrosis. Urinary bladder decompressed. 6 cm left ovarian complex septated cyst. This was seen on prior study, measuring larger, likely related to position and scan planes. No significant change suspected. This could be further characterized with ultrasound or MRI. Aortic  atherosclerosis. Electronically Signed   By: Rolm Baptise M.D.   On: 12/31/2019 23:26   DG Chest Port 1 View  Result Date: 01/26/2020 CLINICAL DATA:  Weakness. EXAM: PORTABLE CHEST 1 VIEW COMPARISON:  November 19, 2019 FINDINGS: Mild diffuse chronic appearing increased lung markings are seen. There is no evidence of acute infiltrate, pleural effusion or pneumothorax. The heart size and mediastinal contours are within normal limits. The visualized skeletal structures are unremarkable. IMPRESSION: No acute or active cardiopulmonary disease. Electronically Signed   By: Virgina Norfolk M.D.   On: 01/26/2020 16:24   DG Abd 2 Views  Result Date: 01/26/2020 CLINICAL DATA:  Left hip pain, weakness, fall EXAM: X-RAY ABDOMEN 2 VIEWS COMPARISON:  None. FINDINGS: Nonobstructive bowel gas pattern. No free air or organomegaly. No confluent opacity within the lungs. No effusions. Heart is borderline in size. Scoliosis and degenerative changes in the thoracolumbar spine. Posterior fusion changes in the lower lumbar spine. IMPRESSION: No acute findings. Electronically Signed   By: Rolm Baptise M.D.   On: 01/26/2020 16:45   DG Hip Unilat W or Wo Pelvis 2-3 Views Left  Result Date: 01/26/2020 CLINICAL DATA:  Left hip pain after fall EXAM: DG HIP (WITH OR WITHOUT PELVIS) 2-3V LEFT COMPARISON:  None. FINDINGS: No acute bony abnormality. Specifically, no fracture, subluxation, or dislocation. IMPRESSION: No acute bony abnormality. Electronically Signed   By: Rolm Baptise M.D.   On: 01/26/2020 16:44    SIGNED: Deatra James, MD, FACP, FHM. [facility],  Pager (please  use [email or web address] to page/text)  If 7PM-7AM, please contact night-coverage [email or web address], Password [facility] 01/27/2020, 7:20 AM

## 2020-01-27 NOTE — ED Notes (Signed)
Pt back from MRI 

## 2020-01-27 NOTE — ED Notes (Signed)
Pt remains off unit at MRI

## 2020-01-27 NOTE — Progress Notes (Signed)
[facility] RN reports soft blood pressures. MAP 60-61 .  No increase in heart rate and no change in mentation.  Review of labs lactic and white count normal. 2 gm drop hgb from yesterday ans well as decrease platelet count likely diluted with fluid boluses given. No signs of bleeding. nce IVF to 125 and discontinued hydralazine ad lisinopril.

## 2020-01-27 NOTE — Progress Notes (Deleted)
PHARMACIST - PHYSICIAN COMMUNICATION  CONCERNING:  Enoxaparin (Lovenox) for DVT Prophylaxis    RECOMMENDATION: Patient was prescribed enoxaprin 40mg  q24 hours for VTE prophylaxis.   Filed Weights   01/26/20 1452  Weight: 117.9 kg (260 lb)    Body mass index is 46.06 kg/m.  Estimated Creatinine Clearance: 42 mL/min (A) (by C-G formula based on SCr of 1.57 mg/dL (H)).   Based on [place] patient is candidate for enoxaparin 40mg  every 12 hours due to BMI being >40.   DESCRIPTION: Pharmacy has adjusted enoxaparin dose per [facility] policy.  Patient is now receiving enoxaparin 40 mg every 12 hours    Rowland Lathe, PharmD Clinical Pharmacist  01/27/2020 4:09 PM

## 2020-01-27 NOTE — ED Notes (Signed)
Tube station currently down. Pharm states will send risaquad and baclofen as soon as able.

## 2020-01-27 NOTE — Evaluation (Signed)
Physical Therapy Evaluation Patient Details Name: LEANER MORICI MRN: [id number] DOB: [birth year] Today's Date: 01/27/2020   History of Present Illness  Naia Ruff is a 61yoF who comes to [facility] on 9/29 c LLE weakness. Pt thought to have presentation 2/2 MS flare up. PMH: HTN, COPD< OSA, MS, neurogenic bladder, chronic foley, depression, migraine, follicular lymphoma.  Clinical Impression  Pt admitted with above diagnosis. Pt currently with functional limitations due to the deficits listed below (see "PT Problem List"). Upon entry, pt in bed, awake and agreeable to participate. The pt is alert and oriented x4, pleasant,  and generally a good historian, although she is terse, seemingly distracted by pain, frustration, fatigue. Maximal effort and 1-2 minutes requires to EOB, later +2 modA for return to supine, totalA +2 for scoot toward HOB. LLE has trace activation of ankle, knee, hip. Left foot remains plantarflexed. Pt has severe pain LLE from hip to foot, worse with activity. LLE weakness and large body mass preclude safe transfers attempts at this time. Functional mobility assessment demonstrates increased effort/time requirements, poor tolerance, and need for physical assistance, whereas the patient performed these at a higher level of independence PTA. Pt would need a hoyer lift and WC to safely return to home, although unclear if husband would be able to assist or if home would accommodate additional DME. SNF is the safest option at this time, although the patient gives non sequitur when topic is discussed at length.  Pt will benefit from skilled PT intervention to increase independence and safety with basic mobility in preparation for discharge to the venue listed below.       Follow Up Recommendations SNF;Supervision for mobility/OOB    Equipment Recommendations  None recommended by PT    Recommendations for Other Services       Precautions / Restrictions Precautions Precautions: Fall       Mobility  Bed Mobility Overal bed mobility: Needs Assistance Bed Mobility: Supine to Sit;Sit to Supine Rolling: Min guard   Supine to sit: Max assist;+2 for safety/equipment        Transfers Overall transfer level:  (deferred, LLE is to weak to safely attempt)                  Ambulation/Gait                Stairs            Wheelchair Mobility    Modified Rankin (Stroke Patients Only)       Balance                                             Pertinent Vitals/Pain Pain Assessment: 0-10 Pain Score: 9  Pain Location: Left leg pain from hip to foot Pain Intervention(s): Limited activity within patient's tolerance;Monitored during session;Premedicated before session;Repositioned    Home Living Family/patient expects to be discharged to:: Private residence Living Arrangements: Spouse/significant other Available Help at Discharge: Family;Available 24 hours/day Type of Home: House Home Access: Stairs to enter;Ramped entrance Entrance Stairs-Rails: None Entrance Stairs-Number of Steps: has a ramp, and then up two more steps (has a grab handle on the L) Home Layout: One level Home Equipment: Wheelchair - manual;Walker - 4 wheels;Grab bars - tub/shower;Other (comment);Shower seat;Bedside Magazine features editor / Transfers Assistance Needed: rollator or WC for  household mobility; sleepsin lift chair  ADL's / Homemaking Assistance Needed: Husbands assists with all IADLs and some ADLs including bathing (drying off) and some dressing (bra and socks)  Comments: only able to complete home distances with 4WW or w/c Fearful of falling. Endorses 10 falls in past six month (less falls recently due to being more careful).     Hand Dominance   Dominant Hand: Right    Extremity/Trunk Assessment   Upper Extremity Assessment Upper Extremity Assessment: Overall WFL for tasks assessed    Lower Extremity Assessment Lower  Extremity Assessment: LLE deficits/detail LLE Deficits / Details: Trace activation of leg as a whole, only 10% limb movement at ankle, knee, hip; severe pain with active mobility       Communication   Communication: No difficulties  Cognition Arousal/Alertness: Awake/alert Behavior During Therapy: WFL for tasks assessed/performed Overall Cognitive Status: No family/caregiver present to determine baseline cognitive functioning                                        General Comments      Exercises     Assessment/Plan    PT Assessment Patient needs continued PT services  PT Problem List Decreased strength;Decreased range of motion;Decreased activity tolerance;Decreased balance;Decreased safety awareness;Obesity;Decreased mobility;Decreased knowledge of precautions       PT Treatment Interventions DME instruction;Balance training;Gait training;Stair training;Neuromuscular re-education;Cognitive remediation;Functional mobility training;Patient/family education;Therapeutic activities;Therapeutic exercise    PT Goals (Current goals can be found in the Care Plan section)  Acute Rehab PT Goals Patient Stated Goal: resolve leg pain, regain strength PT Goal Formulation: With patient Time For Goal Achievement: 02/10/20 Potential to Achieve Goals: Fair    Frequency Min 2X/week   Barriers to discharge Inaccessible home environment;Decreased caregiver support      Co-evaluation               AM-PAC PT "6 Clicks" Mobility  Outcome Measure Help needed turning from your back to your side while in a flat bed without using bedrails?: A Lot Help needed moving from lying on your back to sitting on the side of a flat bed without using bedrails?: A Lot Help needed moving to and from a bed to a chair (including a wheelchair)?: Total Help needed standing up from a chair using your arms (e.g., wheelchair or bedside chair)?: Total Help needed to walk in hospital room?:  Total Help needed climbing 3-5 steps with a railing? : Total 6 Click Score: 8    End of Session   Activity Tolerance: Patient limited by pain;Patient tolerated treatment well Patient left: in bed;with call bell/phone within reach;with nursing/sitter in room;with family/visitor present Nurse Communication: Mobility status PT Visit Diagnosis: Difficulty in walking, not elsewhere classified (R26.2);Muscle weakness (generalized) (M62.81);Other abnormalities of gait and mobility (R26.89)    Time: 3734-2876 PT Time Calculation (min) (ACUTE ONLY): 22 min   Charges:   PT Evaluation $PT Eval Moderate Complexity: 1 Mod          11:05 AM, 01/27/20 Etta Grandchild, PT, DPT Physical Therapist - [facility]  [phone] ([place])    Lynne Righi C 01/27/2020, 10:59 AM

## 2020-01-27 NOTE — ED Notes (Signed)
BG 125. Pt given her tv remote as it had fallen on the floor.

## 2020-01-27 NOTE — ED Notes (Signed)
Report to mary, rn.  

## 2020-01-27 NOTE — Consult Note (Signed)
Reason for Consult:LLE weakness Requesting Physician: Shahmehdi  CC: LLE weakness  I have been asked by Dr. Jamesetta Geralds to see this patient in consultation for possible MS exacerbation.  HPI: [name] is an 69 y.o. female with a history of multiple medical problems including MS.  Patient reports that she follows with Dr. Manuella Ghazi.  Missed her last appointment.  Last exacerbation was about a year ago and consisted of LUE and left facial symptoms.  Has stopped her DMT due to side effects of weakness.   Reports that at baseline ambulates with a walker.  On 9/28 noted that she was unable to do so due to LLE weakness.  With no improvement presented for evaluation.  Received  1gm of Solumedrol since presentation.    Past Medical History:  Diagnosis Date  . Abdominal aortic atherosclerosis ([place]) 11/11/2016  . ADHD   . Anxiety   . COPD (chronic obstructive pulmonary disease) ([place])   . Depression    major depressive  . Dyspnea    doe  . Edema    left leg  . Follicular lymphoma ([place])    B Cell  . Follicular lymphoma grade II ([place])   . Hypertension   . Hypotension    idiopathic  . Kyphoscoliosis and scoliosis 11/26/2011  . Morbid obesity ([place]) 01/05/2016  . Multiple sclerosis ([place])   . Multiple sclerosis ([place])    1980's  . Neuromuscular disorder ([place])   . Obstructive and reflux uropathy    foley  . Pain    atypical facial  . Peripheral vascular disease of lower extremity with ulceration ([place]) 11/08/2015  . Skin ulcer ([place]) 11/08/2015  . Weakness    generalized. has MS    Past Surgical History:  Procedure Laterality Date  . BACK SURGERY N/A 2002  . CYST EXCISION     lower back  . INGUINAL LYMPH NODE BIOPSY Left 07/04/2016   Procedure: INGUINAL LYMPH NODE BIOPSY;  Surgeon: Christene Lye, MD;  Location: [facility] ORS;  Service: General;  Laterality: Left;  . PORTACATH PLACEMENT N/A 07/22/2016   Procedure: INSERTION PORT-A-CATH;  Surgeon: Christene Lye, MD;  Location: [facility] ORS;   Service: General;  Laterality: N/A;  . TONSILLECTOMY AND ADENOIDECTOMY    . TUBAL LIGATION      Family History  Problem Relation Age of Onset  . COPD Mother   . Diabetes Mother   . Heart failure Mother   . Alcohol abuse Father   . Kidney disease Father   . Kidney failure Father   . Arthritis Sister   . CAD Maternal Grandmother   . Stroke Maternal Grandfather   . Arthritis Sister   . Mental illness Sister   . Arthritis Brother     Social History:  reports that she quit smoking about 3 years ago. Her smoking use included cigarettes. She started smoking about 24 years ago. She has a 20.00 pack-year smoking history. She has never used smokeless tobacco. She reports current drug use. Drug: Marijuana. She reports that she does not drink alcohol.  No Known Allergies  Medications:  I have reviewed the patient's current medications. Scheduled: . acidophilus  1 capsule Oral TID  . atorvastatin  10 mg Oral QHS  . baclofen  10 mg Oral TID  . buPROPion  300 mg Oral Daily  . DULoxetine  60 mg Oral BH-[id number]  . enoxaparin (LOVENOX) injection  40 mg Subcutaneous Q24H  . famotidine  20 mg Oral Daily  . fluticasone furoate-vilanterol  1 puff Inhalation Daily  . gabapentin  600 mg Oral TID  . insulin aspart  0-15 Units Subcutaneous TID WC  . insulin aspart  0-5 Units Subcutaneous QHS  . magnesium oxide  400 mg Oral Daily  . mirabegron ER  50 mg Oral Daily  . multivitamin with minerals  1 tablet Oral Daily  . QUEtiapine Fumarate  150 mg Oral QHS  . sodium chloride flush  3 mL Intravenous Q12H  . sodium chloride flush  3 mL Intravenous Q12H  . Teriflunomide  14 mg Oral Daily  . cyanocobalamin  1,000 mcg Oral Daily  . vortioxetine HBr  5 mg Oral Daily    ROS: History obtained from the patient  General ROS: negative for - chills, fatigue, fever, night sweats, weight gain or weight loss Psychological ROS: negative for - behavioral disorder, hallucinations, memory difficulties, mood swings  or suicidal ideation Ophthalmic ROS: negative for - blurry vision, double vision, eye pain or loss of vision ENT ROS: negative for - epistaxis, nasal discharge, oral lesions, sore throat, tinnitus or vertigo Allergy and Immunology ROS: negative for - hives or itchy/watery eyes Hematological and Lymphatic ROS: negative for - bleeding problems, bruising or swollen lymph nodes Endocrine ROS: negative for - galactorrhea, hair pattern changes, polydipsia/polyuria or temperature intolerance Respiratory ROS: negative for - cough, hemoptysis, shortness of breath or wheezing Cardiovascular ROS: negative for - chest pain, dyspnea on exertion, edema or irregular heartbeat Gastrointestinal ROS: negative for - abdominal pain, diarrhea, hematemesis, nausea/vomiting or stool incontinence Genito-Urinary ROS: negative for - dysuria, hematuria, incontinence or urinary frequency/urgency Musculoskeletal ROS: left leg cramps Neurological ROS: as noted in HPI Dermatological ROS: negative for rash and skin lesion changes  Physical Examination: Blood pressure 112/74, pulse 62, temperature 98.6 F (37 C), temperature source Oral, resp. rate 15, height 5\' 3"  (1.6 m), weight 117.9 kg, SpO2 100 %.  HEENT-  Normocephalic, no lesions, without obvious abnormality.  Normal external eye and conjunctiva.  Normal TM's bilaterally.  Normal auditory canals and external ears. Normal external nose, mucus membranes and septum.  Normal pharynx.  Poor dentition Cardiovascular- S1, S2 normal, pulses palpable throughout   Lungs- chest clear, no wheezing, rales, normal symmetric air entry Abdomen- soft, non-tender; bowel sounds normal; no masses,  no organomegaly Extremities- no edema Lymph-no adenopathy palpable Musculoskeletal-no joint tenderness, deformity or swelling Skin-warm and dry, no hyperpigmentation, vitiligo, or suspicious lesions  Neurological Examination   Mental Status: Alert, oriented, thought content appropriate.   Speech fluent without evidence of aphasia.  Able to follow 3 step commands without difficulty. Cranial Nerves: II: Discs flat bilaterally; Visual fields grossly normal, pupils equal, round, reactive to light and accommodation III,IV, VI: ptosis not present, extra-ocular motions intact bilaterally V,VII: decrease in left NLF, facial light touch sensation normal bilaterally VIII: hearing normal bilaterally IX,X: gag reflex present XI: bilateral shoulder shrug XII: midline tongue extension Motor: 5/5 in the BUE's.  Able to lift the RLE off the bed with good strength.  Unable to lift the left leg off the bed.  Plantar extension noted on the left Sensory: Pinprick and light touch decreased on the left Deep Tendon Reflexes: Symmetric throughout Plantars: Right: mute   Left: mute Cerebellar: Normal finger-to-nose testing bilaterally Gait: not tested due to safety concerns    Laboratory Studies:   Basic Metabolic Panel: Recent Labs  Lab 01/26/20 1503 01/27/20 0343  NA 135 135  K 3.9 4.0  CL 100 104  CO2 26 21*  GLUCOSE 131* 210*  BUN 18 21  CREATININE 1.35* 1.57*  CALCIUM 9.0 8.5*    Liver Function Tests: Recent Labs  Lab 01/26/20 1503 01/27/20 0343  AST 15 18  ALT 16 14  ALKPHOS 66 53  BILITOT 0.6 0.7  PROT 7.5 6.7  ALBUMIN 3.9 3.3*   No results for input(s): LIPASE, AMYLASE in the last 168 hours. No results for input(s): AMMONIA in the last 168 hours.  CBC: Recent Labs  Lab 01/26/20 1503 01/27/20 0343  WBC 9.3 7.5  NEUTROABS 7.7  --   HGB 11.8* 9.9*  HCT 36.3 30.8*  MCV 87.1 88.8  PLT 148* 124*    Cardiac Enzymes: No results for input(s): CKTOTAL, CKMB, CKMBINDEX, TROPONINI in the last 168 hours.  BNP: Invalid input(s): POCBNP  CBG: Recent Labs  Lab 01/27/20 0033 01/27/20 0900 01/27/20 1232  GLUCAP 202* 154* 145*    Microbiology: Results for orders placed or performed during the hospital encounter of 01/26/20  Blood Culture (routine x 2)      Status: None (Preliminary result)   Collection Time: 01/26/20  3:04 PM   Specimen: BLOOD  Result Value Ref Range Status   Specimen Description BLOOD RIGHT ANTECUBITAL  Final   Special Requests   Final    BOTTLES DRAWN AEROBIC AND ANAEROBIC Blood Culture adequate volume   Culture   Final    NO GROWTH < 24 HOURS Performed at [facility], [address], [place], [place] [address]    Report Status PENDING  Incomplete  Blood Culture (routine x 2)     Status: None (Preliminary result)   Collection Time: 01/26/20  3:04 PM   Specimen: BLOOD  Result Value Ref Range Status   Specimen Description BLOOD RIGHT ANTECUBITAL  Final   Special Requests   Final    BOTTLES DRAWN AEROBIC AND ANAEROBIC Blood Culture adequate volume   Culture   Final    NO GROWTH < 24 HOURS Performed at [facility], [address], [place], [place] [address]    Report Status PENDING  Incomplete  Respiratory Panel by RT PCR (Flu A&B, Covid) - Nasopharyngeal Swab     Status: None   Collection Time: 01/26/20  5:36 PM   Specimen: Nasopharyngeal Swab  Result Value Ref Range Status   SARS Coronavirus 2 by RT PCR NEGATIVE NEGATIVE Final    Comment: (NOTE) SARS-CoV-2 target nucleic acids are NOT DETECTED.  The SARS-CoV-2 RNA is generally detectable in upper respiratoy specimens during the acute phase of infection. The lowest concentration of SARS-CoV-2 viral copies this assay can detect is 131 copies/mL. A negative result does not preclude SARS-Cov-2 infection and should not be used as the sole basis for treatment or other patient management decisions. A negative result may occur with  improper specimen collection/handling, submission of specimen other than nasopharyngeal swab, presence of viral mutation(s) within the areas targeted by this assay, and inadequate number of viral copies (<131 copies/mL). A negative result must be combined with clinical observations, patient history, and  epidemiological information. The expected result is Negative.  Fact Sheet for Patients:  PinkCheek.be  Fact Sheet for Healthcare Providers:  GravelBags.it  This test is no t yet approved or cleared by the [place] FDA and  has been authorized for detection and/or diagnosis of SARS-CoV-2 by FDA under an Emergency Use Authorization (EUA). This EUA will remain  in effect (meaning this test can be used) for the duration of the COVID-19 declaration under Section 564(b)(1) of the Act, 21  U.S.C. section 360bbb-3(b)(1), unless the authorization is terminated or revoked sooner.     Influenza A by PCR NEGATIVE NEGATIVE Final   Influenza B by PCR NEGATIVE NEGATIVE Final    Comment: (NOTE) The Xpert Xpress SARS-CoV-2/FLU/RSV assay is intended as an aid in  the diagnosis of influenza from Nasopharyngeal swab specimens and  should not be used as a sole basis for treatment. Nasal washings and  aspirates are unacceptable for Xpert Xpress SARS-CoV-2/FLU/RSV  testing.  Fact Sheet for Patients: PinkCheek.be  Fact Sheet for Healthcare Providers: GravelBags.it  This test is not yet approved or cleared by the [place] FDA and  has been authorized for detection and/or diagnosis of SARS-CoV-2 by  FDA under an Emergency Use Authorization (EUA). This EUA will remain  in effect (meaning this test can be used) for the duration of the  Covid-19 declaration under Section 564(b)(1) of the Act, 21  U.S.C. section 360bbb-3(b)(1), unless the authorization is  terminated or revoked. Performed at [facility], [address] [place], [place] [address]     Coagulation Studies: Recent Labs    01/26/20 1503  LABPROT 13.1  INR 1.0    Urinalysis:  Recent Labs  Lab 01/26/20 2002  COLORURINE YELLOW*  LABSPEC 1.021  PHURINE 5.0  GLUCOSEU NEGATIVE  HGBUR SMALL*   BILIRUBINUR NEGATIVE  KETONESUR NEGATIVE  PROTEINUR 100*  NITRITE NEGATIVE  LEUKOCYTESUR LARGE*    Lipid Panel:     Component Value Date/Time   CHOL 155 02/02/2018 1613   TRIG 147 02/02/2018 1613   HDL 35 (L) 02/02/2018 1613   CHOLHDL 4.4 02/02/2018 1613   VLDL 28 11/08/2015 1307   LDLCALC 96 02/02/2018 1613    HgbA1C:  Lab Results  Component Value Date   HGBA1C 5.4 01/26/2020    Urine Drug Screen:      Component Value Date/Time   LABOPIA NONE DETECTED 07/04/2016 1048   COCAINSCRNUR NONE DETECTED 07/04/2016 1048   LABBENZ NONE DETECTED 07/04/2016 1048   AMPHETMU NONE DETECTED 07/04/2016 1048   THCU NONE DETECTED 07/04/2016 1048   LABBARB NONE DETECTED 07/04/2016 1048    Alcohol Level: No results for input(s): ETH in the last 168 hours.  Other results: EKG: sinus rhythm at 94 bpm.  Imaging: CT HEAD WO CONTRAST  Result Date: 01/26/2020 CLINICAL DATA:  Weakness. EXAM: CT HEAD WITHOUT CONTRAST TECHNIQUE: Contiguous axial images were obtained from the base of the skull through the vertex without intravenous contrast. COMPARISON:  June 20, 2019 FINDINGS: Brain: There is moderate severity cerebral atrophy with widening of the extra-axial spaces and ventricular dilatation. There are areas of decreased attenuation within the white matter tracts of the supratentorial brain, consistent with microvascular disease changes. Vascular: No hyperdense vessel or unexpected calcification. Skull: Normal. Negative for fracture or focal lesion. Sinuses/Orbits: No acute finding. Other: None. IMPRESSION: 1. Generalized cerebral atrophy. 2. No acute intracranial abnormality. Electronically Signed   By: Virgina Norfolk M.D.   On: 01/26/2020 16:27   DG Chest Port 1 View  Result Date: 01/26/2020 CLINICAL DATA:  Weakness. EXAM: PORTABLE CHEST 1 VIEW COMPARISON:  November 19, 2019 FINDINGS: Mild diffuse chronic appearing increased lung markings are seen. There is no evidence of acute infiltrate,  pleural effusion or pneumothorax. The heart size and mediastinal contours are within normal limits. The visualized skeletal structures are unremarkable. IMPRESSION: No acute or active cardiopulmonary disease. Electronically Signed   By: Virgina Norfolk M.D.   On: 01/26/2020 16:24   DG Abd 2 Views  Result Date: 01/26/2020 CLINICAL DATA:  Left hip pain, weakness, fall EXAM: X-RAY ABDOMEN 2 VIEWS COMPARISON:  None. FINDINGS: Nonobstructive bowel gas pattern. No free air or organomegaly. No confluent opacity within the lungs. No effusions. Heart is borderline in size. Scoliosis and degenerative changes in the thoracolumbar spine. Posterior fusion changes in the lower lumbar spine. IMPRESSION: No acute findings. Electronically Signed   By: Rolm Baptise M.D.   On: 01/26/2020 16:45   DG Hip Unilat W or Wo Pelvis 2-3 Views Left  Result Date: 01/26/2020 CLINICAL DATA:  Left hip pain after fall EXAM: DG HIP (WITH OR WITHOUT PELVIS) 2-3V LEFT COMPARISON:  None. FINDINGS: No acute bony abnormality. Specifically, no fracture, subluxation, or dislocation. IMPRESSION: No acute bony abnormality. Electronically Signed   By: Rolm Baptise M.D.   On: 01/26/2020 16:44     Assessment/Plan: 69 y.o. female with a history of multiple medical problems including MS.  Reports that at baseline ambulates with a walker.  On 9/28 noted that she was unable to do so due to LLE weakness.  With no improvement presented for evaluation.  Received  1gm of Solumedrol since presentation with reportedly some improvement but remains unable to lift the leg off the bed.  MS exacerbation likely etiology for presentation, particularly with patient being noncompliant with her DMT but patient with multiple vascular risk factors as well.  Has not had brain imaging in quite some time.    Recommendations: 1. Would continue Solumedrol at 1 gm q 24 hours for a total of 5 days.  No po taper required at the end of treatment. 2. GI prophylaxis for  steroids 3. PT/OT 4. MRI of the brain with and without contrast 5. Increase in Baclofen for cramping to 15mg  in the morning, 10mg  midday and 15mg  at night.   6. After discharge patient to return to follow up with Dr. Manuella Ghazi as an outpatient for further discussions concerning DMT.  Alexis Goodell, MD Neurology [phone] 01/27/2020, 1:59 PM

## 2020-01-27 NOTE — ED Notes (Signed)
[name] Settler, np notified of hypotension. Order received to increase fluids to 155ml/hr.

## 2020-01-28 ENCOUNTER — Encounter: Payer: Self-pay | Admitting: Student

## 2020-01-28 DIAGNOSIS — I10 Essential (primary) hypertension: Secondary | ICD-10-CM | POA: Diagnosis not present

## 2020-01-28 DIAGNOSIS — R29898 Other symptoms and signs involving the musculoskeletal system: Secondary | ICD-10-CM | POA: Diagnosis not present

## 2020-01-28 LAB — BASIC METABOLIC PANEL
Anion gap: 11 (ref 5–15)
BUN: 24 mg/dL — ABNORMAL HIGH (ref 8–23)
CO2: 23 mmol/L (ref 22–32)
Calcium: 8.8 mg/dL — ABNORMAL LOW (ref 8.9–10.3)
Chloride: 105 mmol/L (ref 98–111)
Creatinine, Ser: 1.12 mg/dL — ABNORMAL HIGH (ref 0.44–1.00)
GFR calc Af Amer: 58 mL/min — ABNORMAL LOW (ref >60)
GFR calc non Af Amer: 50 mL/min — ABNORMAL LOW (ref >60)
Glucose, Bld: 154 mg/dL — ABNORMAL HIGH (ref 70–99)
Potassium: 4.2 mmol/L (ref 3.5–5.1)
Sodium: 139 mmol/L (ref 135–145)

## 2020-01-28 LAB — GLUCOSE, CAPILLARY
Glucose-Capillary: 145 mg/dL — ABNORMAL HIGH (ref 70–99)
Glucose-Capillary: 171 mg/dL — ABNORMAL HIGH (ref 70–99)
Glucose-Capillary: 122 mg/dL — ABNORMAL HIGH (ref 70–99)
Glucose-Capillary: 139 mg/dL — ABNORMAL HIGH (ref 70–99)

## 2020-01-28 LAB — CBC
HCT: 27.9 % — ABNORMAL LOW (ref 36.0–46.0)
Hemoglobin: 9.5 g/dL — ABNORMAL LOW (ref 12.0–15.0)
MCH: 29.2 pg (ref 26.0–34.0)
MCHC: 34.1 g/dL (ref 30.0–36.0)
MCV: 85.8 fL (ref 80.0–100.0)
Platelets: 130 10*3/uL — ABNORMAL LOW (ref 150–400)
RBC: 3.25 MIL/uL — ABNORMAL LOW (ref 3.87–5.11)
RDW: 14.8 % (ref 11.5–15.5)
WBC: 8.5 10*3/uL (ref 4.0–10.5)
nRBC: 0 % (ref 0.0–0.2)

## 2020-01-28 MED ORDER — INFLUENZA VAC A&B SA ADJ QUAD 0.5 ML IM PRSY
0.5000 mL | PREFILLED_SYRINGE | INTRAMUSCULAR | Status: AC
  Administered 2020-01-29: 0.5 mL via INTRAMUSCULAR
  Filled 2020-01-28: qty 0.5

## 2020-01-28 MED ORDER — CHLORHEXIDINE GLUCONATE CLOTH 2 % EX PADS
6.0000 | MEDICATED_PAD | Freq: Every day | CUTANEOUS | Status: DC
  Administered 2020-01-28 – 2020-02-03 (×7): 6 via TOPICAL

## 2020-01-28 MED ORDER — ENSURE MAX PROTEIN PO LIQD
11.0000 [oz_av] | Freq: Every day | ORAL | Status: DC
  Administered 2020-01-28 – 2020-02-03 (×7): 11 [oz_av] via ORAL
  Filled 2020-01-28: qty 330

## 2020-01-28 MED ORDER — OCUVITE-LUTEIN PO CAPS
1.0000 | ORAL_CAPSULE | Freq: Every day | ORAL | Status: DC
  Administered 2020-01-28 – 2020-02-03 (×7): 1 via ORAL
  Filled 2020-01-28 (×8): qty 1

## 2020-01-28 NOTE — Progress Notes (Signed)
Initial Nutrition Assessment  DOCUMENTATION CODES:   Morbid obesity  INTERVENTION:  Ensure Max po daily, provides 150 kcal and 30 grams of protein  Ocuvite daily for wound healing (provides zinc, vitamin A, vitamin C, Vitamin E, copper, and selenium)  Continue MVI with minerals daily  NUTRITION DIAGNOSIS:   Increased nutrient needs related to wound healing, chronic illness (stage II sacral and mid coccyx; COPD) as evidenced by estimated needs.    GOAL:   Patient will meet greater than or equal to 90% of their needs    MONITOR:   PO intake, Weight trends, Labs, I & O's, Supplement acceptance, Skin  REASON FOR ASSESSMENT:   Malnutrition Screening Tool    ASSESSMENT:   69 year old female with history of HTN, COPD, OSA, multiple sclerosis, neurogenic bladder with chronic indwelling Foley catheter, CKD stage III, depression, anxiety, migraine, follicular lymphoma treated in the past, who walks with a walker at baseline presented with weakness of left lower extremity since yesterday.  Met with pt bedside this morning, reports having a hard boiled egg, oatmeal, and coffee for breakfast. She reports varying po intake at home secondary to dependent upon her husband for preparing meals/bringing food to her. Pt recalls sometimes having breakfast, states if husband remembers to bring her lunch she will usually have a sandwich or soup, recalls meat with vegetable for dinner. She drinks mostly water and occasionally has a chocolate Ensure. Pt is on heart healthy diet which restricts protein, will order Ensure Max to aid with meeting needs.   Per chart, weights have trended down 12.5 lb (4.6%) in the last 3 weeks; significant.   Medications reviewed and include: Acidophilus, Baclofen, Pepcid, Gabapentin, Mag-ox, MVI IVPB: Methylprednisolone, Rocephin Labs: CBGs 171,145,130, BUN (H), Cr 1.12 (H)  NUTRITION - FOCUSED PHYSICAL EXAM: Mild orbital and buccal fat depletion   Diet Order:    Diet Order            Diet Heart Room service appropriate? Yes; Fluid consistency: Thin  Diet effective now                 EDUCATION NEEDS:   Education needs have been addressed  Skin:  Skin Assessment: Skin Integrity Issues: Skin Integrity Issues:: Stage II Stage II: mid coccyx;buttocks (10/20/19)  Last BM:  9/30  Height:   Ht Readings from Last 1 Encounters:  01/26/20 '5\' 3"'  (1.6 m)    Weight:   Wt Readings from Last 1 Encounters:  01/26/20 117.9 kg    BMI:  Body mass index is 46.06 kg/m.  Estimated Nutritional Needs:   Kcal:  1900-2100  Protein:  90-100  Fluid:  >/= 1.9 L/day   Lajuan Lines, RD, LDN Clinical Nutrition After Hours/Weekend Pager # in [place]

## 2020-01-28 NOTE — Progress Notes (Signed)
PROGRESS NOTE    Patient: [name]                            PCP: Kirk Ruths, MD                    DOB: Jan 24, 1951            DOA: 01/26/2020 JJO:[id number]             DOS: 01/28/2020, 1:10 PM   LOS: 2 days   Date of Service: The patient was seen and examined on 01/28/2020  Subjective:   The patient was seen and examined this morning, reporting improved strength in the right leg, still complaining of pain improved numbness tingling. With added medication improved pain... Stating but still there   Brief Narrative:   [name] is a 69 y.o.F w PMh/o HTN, COPD, OSA, multiple sclerosis, neurogenic bladder with chronic indwelling Foley catheter, depression, migraine, follicular lymphoma treated in the past, presented at [facility] ED due to weakness of left lower extremity.   ED: Vitals were normal, LABs: Chronic anemia hemoglobin stable 11.8 UA cloudy, WBC count elevated, urine culture pending, blood culture pending, Covid negative CT head negative for any acute changes Chest x-ray negative for any acute findings Abdominal x-ray negative for any acute findings Left hip x-ray negative for any acute findings  Assessment & Plan:   Principal Problem:   Weakness of left leg Active Problems:   UTI (urinary tract infection)   COPD with bronchial hyperresponsiveness (HCC)   Essential hypertension   Neurogenic bladder   Multiple sclerosis (HCC)   Left leg weakness   Left lower extremity weakness could be secondary to MS flareup -Much improved right lower extremity numbness tingling strength and pain -Continue IV Solu-Medrol 1 g daily for total of 3/ 5 days - per neurology recommendations  Patient remains at high risk for hypoglycemia so started on Humalog sliding scale, monitor CBGs Resumed home medication teriflunomide Follow PT/OT eval Neurology Dr. Rick Duff follow-up recommendations  UTI,  -Chronic indwelling catheter-follow-up with urologist as an  outpatient -Remained hemodynamically stable, continue antibiotics Patient has neurogenic bladder.  Foley catheter was replaced in the ED UA positive, patient had low-grade fever Patient was given antibiotics cefepime and vancomycin in the ED Started ceftriaxone 1 g IV daily.. will cont.  Resumed Myrbetriq Follow urine culture-revealing multiple species suggesting recollection -which we will Blood cultures-no growth to date   AKI on CKD stage III,  creatinine slightly elevated 1.35 IV fluid given for overnight hydration, reassess tomorrow a.m. Cr; 1.35 >> 1.57 >>1.12 Avoiding nephrotoxins -Improving, monitoring  Hypertension,  -Remained stable d -continue to monitor Resume home medications amlodipine, lisinopril, hydralazine Monitor renal functions, if deteriorated then discontinue lisinopril   COPD, OSA, patient does not wear CPAP at home No exacerbation noted on exam Continue to monitor. -Stable   Depression and migraine, no acute issues Resumed home medications.  Acute on chronic pain exacerbated likely by MS -Baclofen ordered modified increased dose per neurology   Nutritional status:         Cultures; Urine Culture  >>> multiple organism nonspecific Recollecting today 01/28/2020 >>   Antimicrobials: 9/30/21cefepime and vancomycin in the ED 01/27/20 Started ceftriaxone 1 g IV daily    Consultants: Neuro    ---------------------------------------------------------------------------------------------------------------------------------------------  DVT prophylaxis:  SCD/Compression stockings and Lovenox SQ Code Status:   Code Status: Full Code Family Communication: No family member  present at bedside- attempt will be made to update daily The above findings and plan of care has been discussed with patient (and family )  in detail,  they expressed understanding and agreement of above. -Advance care planning has been discussed.   Admission status:     Status is: Inpatient  Remains inpatient appropriate because:Inpatient level of care appropriate due to severity of illness   Dispo: The patient is from: Home              Anticipated d/c is to: Home with home health              Anticipated d/c date is: 3 days               Patient currently is not medically stable to d/c.        Procedures:   No admission procedures for hospital encounter.     Antimicrobials:  Anti-infectives (From admission, onward)   Start     Dose/Rate Route Frequency Ordered Stop   01/27/20 0900  cefTRIAXone (ROCEPHIN) 1 g in sodium chloride 0.9 % 100 mL IVPB  Status:  Discontinued        1 g 200 mL/hr over 30 Minutes Intravenous Every 24 hours 01/26/20 2202 01/27/20 0449   01/27/20 0900  cefTRIAXone (ROCEPHIN) 2 g in sodium chloride 0.9 % 100 mL IVPB        2 g 200 mL/hr over 30 Minutes Intravenous Every 24 hours 01/27/20 0449     01/26/20 1630  vancomycin (VANCOREADY) IVPB 1250 mg/250 mL        1,250 mg 166.7 mL/hr over 90 Minutes Intravenous  Once 01/26/20 1526 01/26/20 2022   01/26/20 1500  ceFEPIme (MAXIPIME) 2 g in sodium chloride 0.9 % 100 mL IVPB        2 g 200 mL/hr over 30 Minutes Intravenous  Once 01/26/20 1459 01/26/20 1638   01/26/20 1500  metroNIDAZOLE (FLAGYL) IVPB 500 mg        500 mg 100 mL/hr over 60 Minutes Intravenous  Once 01/26/20 1459 01/26/20 1801   01/26/20 1500  vancomycin (VANCOCIN) IVPB 1000 mg/200 mL premix        1,000 mg 200 mL/hr over 60 Minutes Intravenous  Once 01/26/20 1459 01/26/20 2227       Medication:  . acidophilus  1 capsule Oral TID  . atorvastatin  10 mg Oral QHS  . baclofen  10 mg Oral Daily  . baclofen  15 mg Oral BID  . buPROPion  300 mg Oral Daily  . Chlorhexidine Gluconate Cloth  6 each Topical Daily  . DULoxetine  60 mg Oral BH-[id number]  . enoxaparin (LOVENOX) injection  40 mg Subcutaneous Q24H  . famotidine  20 mg Oral Daily  . fluticasone furoate-vilanterol  1 puff Inhalation Daily  .  gabapentin  600 mg Oral TID  . [START ON 01/29/2020] influenza vaccine adjuvanted  0.5 mL Intramuscular Tomorrow-1000  . insulin aspart  0-15 Units Subcutaneous TID WC  . insulin aspart  0-5 Units Subcutaneous QHS  . magnesium oxide  400 mg Oral Daily  . mirabegron ER  50 mg Oral Daily  . multivitamin with minerals  1 tablet Oral Daily  . QUEtiapine Fumarate  150 mg Oral QHS  . sodium chloride flush  3 mL Intravenous Q12H  . sodium chloride flush  3 mL Intravenous Q12H  . Teriflunomide  14 mg Oral Daily  . cyanocobalamin  1,000 mcg Oral Daily  . vortioxetine  HBr  5 mg Oral Daily    sodium chloride, acetaminophen **OR** acetaminophen, acetaminophen, benzocaine, clonazePAM, docusate sodium, hydrALAZINE, ondansetron **OR** ondansetron (ZOFRAN) IV, polyethylene glycol, sodium chloride flush   Objective:   Vitals:   01/27/20 1934 01/27/20 2332 01/28/20 0351 01/28/20 0818  BP: 118/66 123/63 (!) 107/52 121/85  Pulse: 70 71 65 72  Resp: 18 18 18 15   Temp: 98.2 F (36.8 C) (!) 97.5 F (36.4 C) 97.6 F (36.4 C) 98.1 F (36.7 C)  TempSrc: Oral Oral Oral Oral  SpO2: 99% 97% 95% 100%  Weight:      Height:        Intake/Output Summary (Last 24 hours) at 01/28/2020 1310 Last data filed at 01/28/2020 1013 Gross per 24 hour  Intake 370.37 ml  Output 1650 ml  Net -1279.63 ml   Filed Weights   01/26/20 1452  Weight: 117.9 kg     Examination:      Physical Exam:   General:  Alert, oriented, cooperative, no distress;   HEENT:  Normocephalic, PERRL, otherwise with in Normal limits   Neuro:  CNII-XII intact. , normal motor and sensation, reflexes intact   Lungs:   Clear to auscultation BL, Respirations unlabored, no wheezes / crackles  Cardio:    S1/S2, RRR, No murmure, No Rubs or Gallops   Abdomen:   Soft, non-tender, bowel sounds active all four quadrants,  no guarding or peritoneal signs.  Muscular skeletal:   Right leg weakness, generalized weaknesses Limited exam - in bed,  able to move all 4 extremities, improved strength in the right lower extremity  4 out of 5, sensation intact 2+ pulses,  symmetric, No pitting edema  Skin:  Dry, warm to touch, negative for any Rashes, No open wounds  Wounds: Please see nursing documentation Pressure Injury 10/20/19 Buttocks Left Stage 2 -  Partial thickness loss of dermis presenting as a shallow open injury with a red, pink wound bed without slough. (Active)  10/20/19 2125  Location: Buttocks  Location Orientation: Left  Staging: Stage 2 -  Partial thickness loss of dermis presenting as a shallow open injury with a red, pink wound bed without slough.  Wound Description (Comments):   Present on Admission:      Pressure Injury 10/20/19 Coccyx Mid Stage 2 -  Partial thickness loss of dermis presenting as a shallow open injury with a red, pink wound bed without slough. (Active)  10/20/19 2125  Location: Coccyx  Location Orientation: Mid  Staging: Stage 2 -  Partial thickness loss of dermis presenting as a shallow open injury with a red, pink wound bed without slough.  Wound Description (Comments):   Present on Admission:     Chronic Foley cath in place      ------------------------------------------------------------------------------------------------------------------------------------------    LABs:  CBC Latest Ref Rng & Units 01/28/2020 01/27/2020 01/26/2020  WBC 4.0 - 10.5 K/uL 8.5 7.5 9.3  Hemoglobin 12.0 - 15.0 g/dL 9.5(L) 9.9(L) 11.8(L)  Hematocrit 36 - 46 % 27.9(L) 30.8(L) 36.3  Platelets 150 - 400 K/uL 130(L) 124(L) 148(L)   CMP Latest Ref Rng & Units 01/28/2020 01/27/2020 01/26/2020  Glucose 70 - 99 mg/dL 154(H) 210(H) 131(H)  BUN 8 - 23 mg/dL 24(H) 21 18  Creatinine 0.44 - 1.00 mg/dL 1.12(H) 1.57(H) 1.35(H)  Sodium 135 - 145 mmol/L 139 135 135  Potassium 3.5 - 5.1 mmol/L 4.2 4.0 3.9  Chloride 98 - 111 mmol/L 105 104 100  CO2 22 - 32 mmol/L 23 21(L) 26  Calcium  8.9 - 10.3 mg/dL 8.8(L) 8.5(L) 9.0  Total  Protein 6.5 - 8.1 g/dL - 6.7 7.5  Total Bilirubin 0.3 - 1.2 mg/dL - 0.7 0.6  Alkaline Phos 38 - 126 U/L - 53 66  AST 15 - 41 U/L - 18 15  ALT 0 - 44 U/L - 14 16       Micro Results Recent Results (from the past 240 hour(s))  Blood Culture (routine x 2)     Status: None (Preliminary result)   Collection Time: 01/26/20  3:04 PM   Specimen: BLOOD  Result Value Ref Range Status   Specimen Description BLOOD RIGHT ANTECUBITAL  Final   Special Requests   Final    BOTTLES DRAWN AEROBIC AND ANAEROBIC Blood Culture adequate volume   Culture   Final    NO GROWTH 2 DAYS Performed at [facility], [name] [place], [place] [address]    Report Status PENDING  Incomplete  Blood Culture (routine x 2)     Status: None (Preliminary result)   Collection Time: 01/26/20  3:04 PM   Specimen: BLOOD  Result Value Ref Range Status   Specimen Description BLOOD RIGHT ANTECUBITAL  Final   Special Requests   Final    BOTTLES DRAWN AEROBIC AND ANAEROBIC Blood Culture adequate volume   Culture   Final    NO GROWTH 2 DAYS Performed at [facility], [address] [place], [place] [address]    Report Status PENDING  Incomplete  Respiratory Panel by RT PCR (Flu A&B, Covid) - Nasopharyngeal Swab     Status: None   Collection Time: 01/26/20  5:36 PM   Specimen: Nasopharyngeal Swab  Result Value Ref Range Status   SARS Coronavirus 2 by RT PCR NEGATIVE NEGATIVE Final    Comment: (NOTE) SARS-CoV-2 target nucleic acids are NOT DETECTED.  The SARS-CoV-2 RNA is generally detectable in upper respiratoy specimens during the acute phase of infection. The lowest concentration of SARS-CoV-2 viral copies this assay can detect is 131 copies/mL. A negative result does not preclude SARS-Cov-2 infection and should not be used as the sole basis for treatment or other patient management decisions. A negative result may occur with  improper specimen collection/handling, submission of specimen  other than nasopharyngeal swab, presence of viral mutation(s) within the areas targeted by this assay, and inadequate number of viral copies (<131 copies/mL). A negative result must be combined with clinical observations, patient history, and epidemiological information. The expected result is Negative.  Fact Sheet for Patients:  PinkCheek.be  Fact Sheet for Healthcare Providers:  GravelBags.it  This test is no t yet approved or cleared by the [place] FDA and  has been authorized for detection and/or diagnosis of SARS-CoV-2 by FDA under an Emergency Use Authorization (EUA). This EUA will remain  in effect (meaning this test can be used) for the duration of the COVID-19 declaration under Section 564(b)(1) of the Act, 21 U.S.C. section 360bbb-3(b)(1), unless the authorization is terminated or revoked sooner.     Influenza A by PCR NEGATIVE NEGATIVE Final   Influenza B by PCR NEGATIVE NEGATIVE Final    Comment: (NOTE) The Xpert Xpress SARS-CoV-2/FLU/RSV assay is intended as an aid in  the diagnosis of influenza from Nasopharyngeal swab specimens and  should not be used as a sole basis for treatment. Nasal washings and  aspirates are unacceptable for Xpert Xpress SARS-CoV-2/FLU/RSV  testing.  Fact Sheet for Patients: PinkCheek.be  Fact Sheet for Healthcare Providers: GravelBags.it  This  test is not yet approved or cleared by the [place] and  has been authorized for detection and/or diagnosis of SARS-CoV-2 by  FDA under an Emergency Use Authorization (EUA). This EUA will remain  in effect (meaning this test can be used) for the duration of the  Covid-19 declaration under Section 564(b)(1) of the Act, 21  U.S.C. section 360bbb-3(b)(1), unless the authorization is  terminated or revoked. Performed at [facility], [address],  [place], [place] [address]   Urine culture     Status: Abnormal   Collection Time: 01/26/20  8:02 PM   Specimen: In/Out Cath Urine  Result Value Ref Range Status   Specimen Description   Final    IN/OUT CATH URINE Performed at [facility], [address], [place], [place] [address]    Special Requests   Final    NONE Performed at [facility], [address] [place], [place] [address]    Culture MULTIPLE SPECIES PRESENT, SUGGEST RECOLLECTION (A)  Final   Report Status 01/27/2020 FINAL  Final    Radiology Reports CT HEAD WO CONTRAST  Result Date: 01/26/2020 CLINICAL DATA:  Weakness. EXAM: CT HEAD WITHOUT CONTRAST TECHNIQUE: Contiguous axial images were obtained from the base of the skull through the vertex without intravenous contrast. COMPARISON:  June 20, 2019 FINDINGS: Brain: There is moderate severity cerebral atrophy with widening of the extra-axial spaces and ventricular dilatation. There are areas of decreased attenuation within the white matter tracts of the supratentorial brain, consistent with microvascular disease changes. Vascular: No hyperdense vessel or unexpected calcification. Skull: Normal. Negative for fracture or focal lesion. Sinuses/Orbits: No acute finding. Other: None. IMPRESSION: 1. Generalized cerebral atrophy. 2. No acute intracranial abnormality. Electronically Signed   By: Virgina Norfolk M.D.   On: 01/26/2020 16:27   MR BRAIN W WO CONTRAST  Result Date: 01/27/2020 CLINICAL DATA:  Multiple sclerosis, new event. Left lower extremity weakness. EXAM: MRI HEAD WITHOUT AND WITH CONTRAST TECHNIQUE: Multiplanar, multiecho pulse sequences of the brain and surrounding structures were obtained without and with intravenous contrast. CONTRAST:  17mL GADAVIST GADOBUTROL 1 MMOL/ML IV SOLN COMPARISON:  Head MRI 11/09/2018 FINDINGS: Brain: No acute infarct, mass, midline shift, or extra-axial fluid collection is identified. A chronic microhemorrhage near the  genu of the left internal capsule is unchanged. Moderate cerebral atrophy is unchanged. Extensive T2 and FLAIR hyperintensity in the cerebral white matter bilaterally is unchanged including confluent periventricular signal abnormality and multiple juxtacortical lesions. There is diffuse FLAIR hyperintensity along the callososeptal interface with diffuse callosal thinning. There is no abnormal enhancement or restricted diffusion. A chronic left cerebellar infarct is unchanged. There is moderate cerebral atrophy. Vascular: Major intracranial vascular flow voids are preserved. Skull and upper cervical spine: Unremarkable bone marrow signal. Sinuses/Orbits: Unremarkable orbits. Trace bilateral mastoid fluid. Clear paranasal sinuses. Other: None. IMPRESSION: Unchanged extensive cerebral white matter disease consistent with multiple sclerosis. No evidence of active demyelination or other acute intracranial abnormality. Electronically Signed   By: Logan Bores M.D.   On: 01/27/2020 18:52   CT ABDOMEN PELVIS W CONTRAST  Result Date: 12/31/2019 CLINICAL DATA:  Chronic Foley catheter in place.  Fever. EXAM: CT ABDOMEN AND PELVIS WITH CONTRAST TECHNIQUE: Multidetector CT imaging of the abdomen and pelvis was performed using the standard protocol following bolus administration of intravenous contrast. CONTRAST:  119mL OMNIPAQUE IOHEXOL 300 MG/ML  SOLN COMPARISON:  10/17/2019 FINDINGS: Lower chest: No acute abnormality. Hepatobiliary: No focal hepatic abnormality. Gallbladder unremarkable. Pancreas: No  focal abnormality or ductal dilatation. Spleen: No focal abnormality.  Normal size. Adrenals/Urinary Tract: Mild fullness of the renal collecting systems bilaterally. Foley catheter in place within the bladder which is decompressed. Adrenal glands unremarkable. Stomach/Bowel: Stomach, large and small bowel grossly unremarkable. Vascular/Lymphatic: Calcified aorta and iliac vessels. No evidence of aneurysm or adenopathy.  Reproductive: 6 cm septated cyst within the left ovary, stable since prior study. Uterus and right ovary unremarkable. Other: No free fluid or free air. Musculoskeletal: Postoperative changes and degenerative changes in the lumbar spine. No acute bony abnormality. IMPRESSION: Fullness of the renal collecting systems bilaterally, similar to prior study. No stones or overt hydronephrosis. Urinary bladder decompressed. 6 cm left ovarian complex septated cyst. This was seen on prior study, measuring larger, likely related to position and scan planes. No significant change suspected. This could be further characterized with ultrasound or MRI. Aortic atherosclerosis. Electronically Signed   By: Rolm Baptise M.D.   On: 12/31/2019 23:26   DG Chest Port 1 View  Result Date: 01/26/2020 CLINICAL DATA:  Weakness. EXAM: PORTABLE CHEST 1 VIEW COMPARISON:  November 19, 2019 FINDINGS: Mild diffuse chronic appearing increased lung markings are seen. There is no evidence of acute infiltrate, pleural effusion or pneumothorax. The heart size and mediastinal contours are within normal limits. The visualized skeletal structures are unremarkable. IMPRESSION: No acute or active cardiopulmonary disease. Electronically Signed   By: Virgina Norfolk M.D.   On: 01/26/2020 16:24   DG Abd 2 Views  Result Date: 01/26/2020 CLINICAL DATA:  Left hip pain, weakness, fall EXAM: X-RAY ABDOMEN 2 VIEWS COMPARISON:  None. FINDINGS: Nonobstructive bowel gas pattern. No free air or organomegaly. No confluent opacity within the lungs. No effusions. Heart is borderline in size. Scoliosis and degenerative changes in the thoracolumbar spine. Posterior fusion changes in the lower lumbar spine. IMPRESSION: No acute findings. Electronically Signed   By: Rolm Baptise M.D.   On: 01/26/2020 16:45   DG Hip Unilat W or Wo Pelvis 2-3 Views Left  Result Date: 01/26/2020 CLINICAL DATA:  Left hip pain after fall EXAM: DG HIP (WITH OR WITHOUT PELVIS) 2-3V LEFT  COMPARISON:  None. FINDINGS: No acute bony abnormality. Specifically, no fracture, subluxation, or dislocation. IMPRESSION: No acute bony abnormality. Electronically Signed   By: Rolm Baptise M.D.   On: 01/26/2020 16:44    SIGNED: Deatra James, MD, FACP, FHM. [facility],  Pager (please use [email or web address] to page/text)  If 7PM-7AM, please contact night-coverage [email or web address], Password [facility] 01/28/2020, 1:10 PM

## 2020-01-28 NOTE — Care Management Important Message (Signed)
Important Message  Patient Details  Name: [name] MRN: [id number] Date of Birth: 1950/08/11   Medicare Important Message Given:  Yes  Initial Medicare IM given by Patient Access Associate on 01/28/2020 at 11:25am.   Dannette Barbara 01/28/2020, 1:51 PM

## 2020-01-28 NOTE — Progress Notes (Signed)
Subjective: She feels that she was slightly improved  Exam: Vitals:   01/28/20 0351 01/28/20 0818  BP: (!) 107/52 121/85  Pulse: 65 72  Resp: 18 15  Temp: 97.6 F (36.4 C) 98.1 F (36.7 C)  SpO2: 95% 100%   Gen: In bed, NAD Resp: non-labored breathing, no acute distress Abd: soft, nt  Neuro: MS: Awake, alert, interactive and appropriate She reports decreased sensation in the left lower extremity, she is able to lift both legs against gravity, but still has some weakness in the left leg  Impression: 69 year old female with a history of multiple sclerosis who presents with worsening weakness and numbness which is improving with steroids.  MRI brain without any acute lesions, but I would still favor treating with Solu-Medrol, especially given that this could be related to spinal cord disease.  Recommendations: 1) solumedrol 1 g daily x5 days 2) PT, OT 3) follow-up as outpatient with her primary neurologist to discuss disease modifying therapy.  Roland Rack, MD [facility] [phone]  If 7pm- 7am, please page neurology on call as listed in [place].

## 2020-01-29 DIAGNOSIS — I10 Essential (primary) hypertension: Secondary | ICD-10-CM | POA: Diagnosis not present

## 2020-01-29 LAB — BASIC METABOLIC PANEL
Anion gap: 11 (ref 5–15)
BUN: 31 mg/dL — ABNORMAL HIGH (ref 8–23)
CO2: 21 mmol/L — ABNORMAL LOW (ref 22–32)
Calcium: 9.1 mg/dL (ref 8.9–10.3)
Chloride: 103 mmol/L (ref 98–111)
Creatinine, Ser: 1.15 mg/dL — ABNORMAL HIGH (ref 0.44–1.00)
GFR calc Af Amer: 56 mL/min — ABNORMAL LOW (ref >60)
GFR calc non Af Amer: 49 mL/min — ABNORMAL LOW (ref >60)
Glucose, Bld: 150 mg/dL — ABNORMAL HIGH (ref 70–99)
Potassium: 3.9 mmol/L (ref 3.5–5.1)
Sodium: 135 mmol/L (ref 135–145)

## 2020-01-29 LAB — CBC
HCT: 24.8 % — ABNORMAL LOW (ref 36.0–46.0)
Hemoglobin: 8.2 g/dL — ABNORMAL LOW (ref 12.0–15.0)
MCH: 28.3 pg (ref 26.0–34.0)
MCHC: 33.1 g/dL (ref 30.0–36.0)
MCV: 85.5 fL (ref 80.0–100.0)
Platelets: 143 10*3/uL — ABNORMAL LOW (ref 150–400)
RBC: 2.9 MIL/uL — ABNORMAL LOW (ref 3.87–5.11)
RDW: 14.8 % (ref 11.5–15.5)
WBC: 6.2 10*3/uL (ref 4.0–10.5)
nRBC: 0 % (ref 0.0–0.2)

## 2020-01-29 LAB — GLUCOSE, CAPILLARY
Glucose-Capillary: 153 mg/dL — ABNORMAL HIGH (ref 70–99)
Glucose-Capillary: 173 mg/dL — ABNORMAL HIGH (ref 70–99)
Glucose-Capillary: 153 mg/dL — ABNORMAL HIGH (ref 70–99)
Glucose-Capillary: 161 mg/dL — ABNORMAL HIGH (ref 70–99)

## 2020-01-29 NOTE — Progress Notes (Signed)
PROGRESS NOTE    Patient: [name]                            PCP: Kirk Ruths, MD                    DOB: November 27, 1950            DOA: 01/26/2020 VZD:[id number]             DOS: 01/29/2020, 10:23 AM   LOS: 3 days   Date of Service: The patient was seen and examined on 01/29/2020  Subjective:   The patient was seen and examined this morning, reporting improvement of right lower extremity strength, still having pain, difficulty ambulating.  Complaining of severe generalized weaknesses. Anxious hesitated but near discharge planning likely in 1 to 2 days   Brief Narrative:   [name] is a 69 y.o.F w PMh/o HTN, COPD, OSA, multiple sclerosis, neurogenic bladder with chronic indwelling Foley catheter, depression, migraine, follicular lymphoma treated in the past, presented at [facility] ED due to weakness of left lower extremity.   ED: Vitals were normal, LABs: Chronic anemia hemoglobin stable 11.8 UA cloudy, WBC count elevated, urine culture pending, blood culture pending, Covid negative CT head negative for any acute changes Chest x-ray negative for any acute findings Abdominal x-ray negative for any acute findings Left hip x-ray negative for any acute findings  Assessment & Plan:   Principal Problem:   Weakness of left leg Active Problems:   UTI (urinary tract infection)   COPD with bronchial hyperresponsiveness (HCC)   Essential hypertension   Neurogenic bladder   Multiple sclerosis (HCC)   Left leg weakness   Left lower extremity weakness could be secondary to MS flareup -Continue to improve -Continue IV Solu-Medrol 1 g daily for total of 4/5 days - per neurology recommendations  Monitoring CBG anticipating hyperglycemia with SSI coverage Resumed home medication teriflunomide Follow PT/OT eval Neurology Dr. Rick Duff follow-up recommendations -Pain medication modified by neurology-baclofen  UTI,  -Chronic indwelling catheter-follow-up with  urologist as an outpatient -Stable Patient has neurogenic bladder.  Foley catheter was replaced in the ED UA positive-urine culture>> multiple species, recultured - Antibiotics cefepime and vancomycin in the ED >> has been on IV Rocephin >>   Resumed Myrbetriq Blood cultures-no growth to date   AKI on CKD stage III,  creatinine slightly elevated 1.35 IV fluid given for overnight hydration, reassess tomorrow a.m. Cr; 1.35 >> 1.57 >>1.12 >> 1.15 -Monitoring avoiding nephrotoxins  Hypertension,  -Stable Resume home medications amlodipine, lisinopril, hydralazine Monitor renal functions, if deteriorated then discontinue lisinopril   COPD, OSA, patient does not wear CPAP at home No exacerbation noted on exam Continue to monitor. -Stable   Depression and migraine, no acute issues -Stable on current meds  Acute on chronic pain exacerbated likely by MS -Baclofen ordered modified increased dose per neurology -Stable   Nutritional status:  Nutrition Problem: Increased nutrient needs Etiology: wound healing, chronic illness (stage II sacral and mid coccyx; COPD) Signs/Symptoms: estimated needs Interventions: MVI, Premier Protein  Cultures; Urine Culture  >>> multiple organism nonspecific Recollecting today 01/28/2020 >>   Antimicrobials: 9/30/21cefepime and vancomycin in the ED 01/27/20 Started ceftriaxone 1 g IV daily    Consultants: Neuro    ---------------------------------------------------------------------------------------------------------------------------------------------  DVT prophylaxis:  SCD/Compression stockings and Lovenox SQ Code Status:   Code Status: Full Code Family Communication: No family member present at bedside- attempt  will be made to update daily The above findings and plan of care has been discussed with patient (and family )  in detail,  they expressed understanding and agreement of above. -Advance care planning has been  discussed.   Admission status:    Status is: Inpatient  Remains inpatient appropriate because:Inpatient level of care appropriate due to severity of illness   Dispo: The patient is from: Home              Anticipated d/c is to: Home with home health (home health. Face-to-face completed)              Anticipated d/c date is: In 1 to 2 days              Patient currently is not medically stable to d/c.        Procedures:   No admission procedures for hospital encounter.     Antimicrobials:  Anti-infectives (From admission, onward)   Start     Dose/Rate Route Frequency Ordered Stop   01/27/20 0900  cefTRIAXone (ROCEPHIN) 1 g in sodium chloride 0.9 % 100 mL IVPB  Status:  Discontinued        1 g 200 mL/hr over 30 Minutes Intravenous Every 24 hours 01/26/20 2202 01/27/20 0449   01/27/20 0900  cefTRIAXone (ROCEPHIN) 2 g in sodium chloride 0.9 % 100 mL IVPB        2 g 200 mL/hr over 30 Minutes Intravenous Every 24 hours 01/27/20 0449     01/26/20 1630  vancomycin (VANCOREADY) IVPB 1250 mg/250 mL        1,250 mg 166.7 mL/hr over 90 Minutes Intravenous  Once 01/26/20 1526 01/26/20 2022   01/26/20 1500  ceFEPIme (MAXIPIME) 2 g in sodium chloride 0.9 % 100 mL IVPB        2 g 200 mL/hr over 30 Minutes Intravenous  Once 01/26/20 1459 01/26/20 1638   01/26/20 1500  metroNIDAZOLE (FLAGYL) IVPB 500 mg        500 mg 100 mL/hr over 60 Minutes Intravenous  Once 01/26/20 1459 01/26/20 1801   01/26/20 1500  vancomycin (VANCOCIN) IVPB 1000 mg/200 mL premix        1,000 mg 200 mL/hr over 60 Minutes Intravenous  Once 01/26/20 1459 01/26/20 2227       Medication:  . acidophilus  1 capsule Oral TID  . atorvastatin  10 mg Oral QHS  . baclofen  10 mg Oral Daily  . baclofen  15 mg Oral BID  . buPROPion  300 mg Oral Daily  . Chlorhexidine Gluconate Cloth  6 each Topical Daily  . DULoxetine  60 mg Oral BH-[id number]  . enoxaparin (LOVENOX) injection  40 mg Subcutaneous Q24H  . famotidine  20  mg Oral Daily  . fluticasone furoate-vilanterol  1 puff Inhalation Daily  . gabapentin  600 mg Oral TID  . influenza vaccine adjuvanted  0.5 mL Intramuscular Tomorrow-1000  . insulin aspart  0-15 Units Subcutaneous TID WC  . insulin aspart  0-5 Units Subcutaneous QHS  . magnesium oxide  400 mg Oral Daily  . mirabegron ER  50 mg Oral Daily  . multivitamin with minerals  1 tablet Oral Daily  . multivitamin-lutein  1 capsule Oral Daily  . Ensure Max Protein  11 oz Oral Daily  . QUEtiapine Fumarate  150 mg Oral QHS  . sodium chloride flush  3 mL Intravenous Q12H  . sodium chloride flush  3 mL Intravenous Q12H  .  Teriflunomide  14 mg Oral Daily  . cyanocobalamin  1,000 mcg Oral Daily  . vortioxetine HBr  5 mg Oral Daily    sodium chloride, acetaminophen, benzocaine, clonazePAM, docusate sodium, hydrALAZINE, ondansetron **OR** ondansetron (ZOFRAN) IV, polyethylene glycol, sodium chloride flush   Objective:   Vitals:   01/28/20 0818 01/28/20 1633 01/29/20 0056 01/29/20 0829  BP: 121/85 (!) 120/48 (!) 114/51 (!) 130/57  Pulse: 72 76 64 61  Resp: 15 17 16 17   Temp: 98.1 F (36.7 C) 98.4 F (36.9 C) 97.7 F (36.5 C) 97.8 F (36.6 C)  TempSrc: Oral Oral Oral Oral  SpO2: 100% 100% 94% 100%  Weight:      Height:        Intake/Output Summary (Last 24 hours) at 01/29/2020 1023 Last data filed at 01/29/2020 0100 Gross per 24 hour  Intake --  Output 500 ml  Net -500 ml   Filed Weights   01/26/20 1452  Weight: 117.9 kg     Examination:      Physical Exam:   General:  Alert, oriented, cooperative, no distress;   HEENT:  Normocephalic, PERRL, otherwise with in Normal limits   Neuro:  CNII-XII intact. , normal motor and sensation, reflexes intact   Lungs:   Clear to auscultation BL, Respirations unlabored, no wheezes / crackles  Cardio:    S1/S2, RRR, No murmure, No Rubs or Gallops   Abdomen:   Soft, non-tender, bowel sounds active all four quadrants,  no guarding or  peritoneal signs.  Muscular skeletal:   Severe generalized weakness, specifically right leg weakness, pain  Difficulty ambulating due to body habitus, right leg weakness Limited exam - in bed, able to move all 4 extremities, Normal strength,  2+ pulses,  symmetric, No pitting edema  Skin:  Dry, warm to touch, negative for any Rashes, No open wounds  Wounds: Please see nursing documentation Pressure Injury 10/20/19 Buttocks Left Stage 2 -  Partial thickness loss of dermis presenting as a shallow open injury with a red, pink wound bed without slough. (Active)  10/20/19 2125  Location: Buttocks  Location Orientation: Left  Staging: Stage 2 -  Partial thickness loss of dermis presenting as a shallow open injury with a red, pink wound bed without slough.  Wound Description (Comments):   Present on Admission:      Pressure Injury 10/20/19 Coccyx Mid Stage 2 -  Partial thickness loss of dermis presenting as a shallow open injury with a red, pink wound bed without slough. (Active)  10/20/19 2125  Location: Coccyx  Location Orientation: Mid  Staging: Stage 2 -  Partial thickness loss of dermis presenting as a shallow open injury with a red, pink wound bed without slough.  Wound Description (Comments):   Present on Admission:           Chronic Foley cath in place      ------------------------------------------------------------------------------------------------------------------------------------------    LABs:  CBC Latest Ref Rng & Units 01/29/2020 01/28/2020 01/27/2020  WBC 4.0 - 10.5 K/uL 6.2 8.5 7.5  Hemoglobin 12.0 - 15.0 g/dL 8.2(L) 9.5(L) 9.9(L)  Hematocrit 36 - 46 % 24.8(L) 27.9(L) 30.8(L)  Platelets 150 - 400 K/uL 143(L) 130(L) 124(L)   CMP Latest Ref Rng & Units 01/29/2020 01/28/2020 01/27/2020  Glucose 70 - 99 mg/dL 150(H) 154(H) 210(H)  BUN 8 - 23 mg/dL 31(H) 24(H) 21  Creatinine 0.44 - 1.00 mg/dL 1.15(H) 1.12(H) 1.57(H)  Sodium 135 - 145 mmol/L 135 139 135  Potassium 3.5  - 5.1 mmol/L  3.9 4.2 4.0  Chloride 98 - 111 mmol/L 103 105 104  CO2 22 - 32 mmol/L 21(L) 23 21(L)  Calcium 8.9 - 10.3 mg/dL 9.1 8.8(L) 8.5(L)  Total Protein 6.5 - 8.1 g/dL - - 6.7  Total Bilirubin 0.3 - 1.2 mg/dL - - 0.7  Alkaline Phos 38 - 126 U/L - - 53  AST 15 - 41 U/L - - 18  ALT 0 - 44 U/L - - 14       Micro Results Recent Results (from the past 240 hour(s))  Blood Culture (routine x 2)     Status: None (Preliminary result)   Collection Time: 01/26/20  3:04 PM   Specimen: BLOOD  Result Value Ref Range Status   Specimen Description BLOOD RIGHT ANTECUBITAL  Final   Special Requests   Final    BOTTLES DRAWN AEROBIC AND ANAEROBIC Blood Culture adequate volume   Culture   Final    NO GROWTH 3 DAYS Performed at [facility], [address] [place], [place] [address]    Report Status PENDING  Incomplete  Blood Culture (routine x 2)     Status: None (Preliminary result)   Collection Time: 01/26/20  3:04 PM   Specimen: BLOOD  Result Value Ref Range Status   Specimen Description BLOOD RIGHT ANTECUBITAL  Final   Special Requests   Final    BOTTLES DRAWN AEROBIC AND ANAEROBIC Blood Culture adequate volume   Culture   Final    NO GROWTH 3 DAYS Performed at [facility], [address], [place], [place] [address]    Report Status PENDING  Incomplete  Respiratory Panel by RT PCR (Flu A&B, Covid) - Nasopharyngeal Swab     Status: None   Collection Time: 01/26/20  5:36 PM   Specimen: Nasopharyngeal Swab  Result Value Ref Range Status   SARS Coronavirus 2 by RT PCR NEGATIVE NEGATIVE Final    Comment: (NOTE) SARS-CoV-2 target nucleic acids are NOT DETECTED.  The SARS-CoV-2 RNA is generally detectable in upper respiratoy specimens during the acute phase of infection. The lowest concentration of SARS-CoV-2 viral copies this assay can detect is 131 copies/mL. A negative result does not preclude SARS-Cov-2 infection and should not be used as the sole basis for  treatment or other patient management decisions. A negative result may occur with  improper specimen collection/handling, submission of specimen other than nasopharyngeal swab, presence of viral mutation(s) within the areas targeted by this assay, and inadequate number of viral copies (<131 copies/mL). A negative result must be combined with clinical observations, patient history, and epidemiological information. The expected result is Negative.  Fact Sheet for Patients:  PinkCheek.be  Fact Sheet for Healthcare Providers:  GravelBags.it  This test is no t yet approved or cleared by the [place] FDA and  has been authorized for detection and/or diagnosis of SARS-CoV-2 by FDA under an Emergency Use Authorization (EUA). This EUA will remain  in effect (meaning this test can be used) for the duration of the COVID-19 declaration under Section 564(b)(1) of the Act, 21 U.S.C. section 360bbb-3(b)(1), unless the authorization is terminated or revoked sooner.     Influenza A by PCR NEGATIVE NEGATIVE Final   Influenza B by PCR NEGATIVE NEGATIVE Final    Comment: (NOTE) The Xpert Xpress SARS-CoV-2/FLU/RSV assay is intended as an aid in  the diagnosis of influenza from Nasopharyngeal swab specimens and  should not be used as a sole basis for treatment. Nasal washings and  aspirates are  unacceptable for Xpert Xpress SARS-CoV-2/FLU/RSV  testing.  Fact Sheet for Patients: PinkCheek.be  Fact Sheet for Healthcare Providers: GravelBags.it  This test is not yet approved or cleared by the [place] FDA and  has been authorized for detection and/or diagnosis of SARS-CoV-2 by  FDA under an Emergency Use Authorization (EUA). This EUA will remain  in effect (meaning this test can be used) for the duration of the  Covid-19 declaration under Section 564(b)(1) of the Act, 21   U.S.C. section 360bbb-3(b)(1), unless the authorization is  terminated or revoked. Performed at [facility], [address] [place], [place] [address]   Urine culture     Status: Abnormal   Collection Time: 01/26/20  8:02 PM   Specimen: In/Out Cath Urine  Result Value Ref Range Status   Specimen Description   Final    IN/OUT CATH URINE Performed at [facility], [address], [place], [place] [address]    Special Requests   Final    NONE Performed at [facility], [address] [place], [place] [address]    Culture MULTIPLE SPECIES PRESENT, SUGGEST RECOLLECTION (A)  Final   Report Status 01/27/2020 FINAL  Final    Radiology Reports CT HEAD WO CONTRAST  Result Date: 01/26/2020 CLINICAL DATA:  Weakness. EXAM: CT HEAD WITHOUT CONTRAST TECHNIQUE: Contiguous axial images were obtained from the base of the skull through the vertex without intravenous contrast. COMPARISON:  June 20, 2019 FINDINGS: Brain: There is moderate severity cerebral atrophy with widening of the extra-axial spaces and ventricular dilatation. There are areas of decreased attenuation within the white matter tracts of the supratentorial brain, consistent with microvascular disease changes. Vascular: No hyperdense vessel or unexpected calcification. Skull: Normal. Negative for fracture or focal lesion. Sinuses/Orbits: No acute finding. Other: None. IMPRESSION: 1. Generalized cerebral atrophy. 2. No acute intracranial abnormality. Electronically Signed   By: Virgina Norfolk M.D.   On: 01/26/2020 16:27   MR BRAIN W WO CONTRAST  Result Date: 01/27/2020 CLINICAL DATA:  Multiple sclerosis, new event. Left lower extremity weakness. EXAM: MRI HEAD WITHOUT AND WITH CONTRAST TECHNIQUE: Multiplanar, multiecho pulse sequences of the brain and surrounding structures were obtained without and with intravenous contrast. CONTRAST:  25mL GADAVIST GADOBUTROL 1 MMOL/ML IV SOLN COMPARISON:  Head MRI  11/09/2018 FINDINGS: Brain: No acute infarct, mass, midline shift, or extra-axial fluid collection is identified. A chronic microhemorrhage near the genu of the left internal capsule is unchanged. Moderate cerebral atrophy is unchanged. Extensive T2 and FLAIR hyperintensity in the cerebral white matter bilaterally is unchanged including confluent periventricular signal abnormality and multiple juxtacortical lesions. There is diffuse FLAIR hyperintensity along the callososeptal interface with diffuse callosal thinning. There is no abnormal enhancement or restricted diffusion. A chronic left cerebellar infarct is unchanged. There is moderate cerebral atrophy. Vascular: Major intracranial vascular flow voids are preserved. Skull and upper cervical spine: Unremarkable bone marrow signal. Sinuses/Orbits: Unremarkable orbits. Trace bilateral mastoid fluid. Clear paranasal sinuses. Other: None. IMPRESSION: Unchanged extensive cerebral white matter disease consistent with multiple sclerosis. No evidence of active demyelination or other acute intracranial abnormality. Electronically Signed   By: Logan Bores M.D.   On: 01/27/2020 18:52   CT ABDOMEN PELVIS W CONTRAST  Result Date: 12/31/2019 CLINICAL DATA:  Chronic Foley catheter in place.  Fever. EXAM: CT ABDOMEN AND PELVIS WITH CONTRAST TECHNIQUE: Multidetector CT imaging of the abdomen and pelvis was performed using the standard protocol following bolus administration of intravenous contrast. CONTRAST:  161mL OMNIPAQUE IOHEXOL  300 MG/ML  SOLN COMPARISON:  10/17/2019 FINDINGS: Lower chest: No acute abnormality. Hepatobiliary: No focal hepatic abnormality. Gallbladder unremarkable. Pancreas: No focal abnormality or ductal dilatation. Spleen: No focal abnormality.  Normal size. Adrenals/Urinary Tract: Mild fullness of the renal collecting systems bilaterally. Foley catheter in place within the bladder which is decompressed. Adrenal glands unremarkable. Stomach/Bowel:  Stomach, large and small bowel grossly unremarkable. Vascular/Lymphatic: Calcified aorta and iliac vessels. No evidence of aneurysm or adenopathy. Reproductive: 6 cm septated cyst within the left ovary, stable since prior study. Uterus and right ovary unremarkable. Other: No free fluid or free air. Musculoskeletal: Postoperative changes and degenerative changes in the lumbar spine. No acute bony abnormality. IMPRESSION: Fullness of the renal collecting systems bilaterally, similar to prior study. No stones or overt hydronephrosis. Urinary bladder decompressed. 6 cm left ovarian complex septated cyst. This was seen on prior study, measuring larger, likely related to position and scan planes. No significant change suspected. This could be further characterized with ultrasound or MRI. Aortic atherosclerosis. Electronically Signed   By: Rolm Baptise M.D.   On: 12/31/2019 23:26   DG Chest Port 1 View  Result Date: 01/26/2020 CLINICAL DATA:  Weakness. EXAM: PORTABLE CHEST 1 VIEW COMPARISON:  November 19, 2019 FINDINGS: Mild diffuse chronic appearing increased lung markings are seen. There is no evidence of acute infiltrate, pleural effusion or pneumothorax. The heart size and mediastinal contours are within normal limits. The visualized skeletal structures are unremarkable. IMPRESSION: No acute or active cardiopulmonary disease. Electronically Signed   By: Virgina Norfolk M.D.   On: 01/26/2020 16:24   DG Abd 2 Views  Result Date: 01/26/2020 CLINICAL DATA:  Left hip pain, weakness, fall EXAM: X-RAY ABDOMEN 2 VIEWS COMPARISON:  None. FINDINGS: Nonobstructive bowel gas pattern. No free air or organomegaly. No confluent opacity within the lungs. No effusions. Heart is borderline in size. Scoliosis and degenerative changes in the thoracolumbar spine. Posterior fusion changes in the lower lumbar spine. IMPRESSION: No acute findings. Electronically Signed   By: Rolm Baptise M.D.   On: 01/26/2020 16:45   DG Hip Unilat W  or Wo Pelvis 2-3 Views Left  Result Date: 01/26/2020 CLINICAL DATA:  Left hip pain after fall EXAM: DG HIP (WITH OR WITHOUT PELVIS) 2-3V LEFT COMPARISON:  None. FINDINGS: No acute bony abnormality. Specifically, no fracture, subluxation, or dislocation. IMPRESSION: No acute bony abnormality. Electronically Signed   By: Rolm Baptise M.D.   On: 01/26/2020 16:44    SIGNED: Deatra James, MD, FACP, FHM. [facility],  Pager (please use [email or web address] to page/text)  If 7PM-7AM, please contact night-coverage [email or web address] Ota [facility] 01/29/2020, 10:23 AM

## 2020-01-30 DIAGNOSIS — I10 Essential (primary) hypertension: Secondary | ICD-10-CM | POA: Diagnosis not present

## 2020-01-30 DIAGNOSIS — R29898 Other symptoms and signs involving the musculoskeletal system: Secondary | ICD-10-CM | POA: Diagnosis not present

## 2020-01-30 LAB — BASIC METABOLIC PANEL
Anion gap: 11 (ref 5–15)
BUN: 35 mg/dL — ABNORMAL HIGH (ref 8–23)
CO2: 24 mmol/L (ref 22–32)
Calcium: 9.4 mg/dL (ref 8.9–10.3)
Chloride: 104 mmol/L (ref 98–111)
Creatinine, Ser: 1.23 mg/dL — ABNORMAL HIGH (ref 0.44–1.00)
GFR calc Af Amer: 52 mL/min — ABNORMAL LOW (ref >60)
GFR calc non Af Amer: 45 mL/min — ABNORMAL LOW (ref >60)
Glucose, Bld: 163 mg/dL — ABNORMAL HIGH (ref 70–99)
Potassium: 4.2 mmol/L (ref 3.5–5.1)
Sodium: 139 mmol/L (ref 135–145)

## 2020-01-30 LAB — CBC
HCT: 27.1 % — ABNORMAL LOW (ref 36.0–46.0)
Hemoglobin: 8.9 g/dL — ABNORMAL LOW (ref 12.0–15.0)
MCH: 28.3 pg (ref 26.0–34.0)
MCHC: 32.8 g/dL (ref 30.0–36.0)
MCV: 86 fL (ref 80.0–100.0)
Platelets: 169 10*3/uL (ref 150–400)
RBC: 3.15 MIL/uL — ABNORMAL LOW (ref 3.87–5.11)
RDW: 15.2 % (ref 11.5–15.5)
WBC: 5.1 10*3/uL (ref 4.0–10.5)
nRBC: 0 % (ref 0.0–0.2)

## 2020-01-30 LAB — GLUCOSE, CAPILLARY
Glucose-Capillary: 144 mg/dL — ABNORMAL HIGH (ref 70–99)
Glucose-Capillary: 179 mg/dL — ABNORMAL HIGH (ref 70–99)
Glucose-Capillary: 157 mg/dL — ABNORMAL HIGH (ref 70–99)
Glucose-Capillary: 146 mg/dL — ABNORMAL HIGH (ref 70–99)

## 2020-01-30 NOTE — Progress Notes (Signed)
Patient reports that she is continuing to improve, today she is able to lift her left leg off the bed much better than when I last saw her, and she reports symmetric sensation.  She has a left exotropia which she reports is her baseline.  At this point, plan is to complete a 5-day course of IV Solu-Medrol.  She previously was taking Aubagio.  She can follow-up with Dr. Manuella Ghazi as an outpatient.  Please call if neurology can be of any further assistance while inpatient.  Roland Rack, MD [facility] [phone]  If 7pm- 7am, please page neurology on call as listed in [place].

## 2020-01-30 NOTE — Progress Notes (Signed)
Physical Therapy Treatment Patient Details Name: [name] MRN: [id number] DOB: [birth year] Today's Date: 01/30/2020    History of Present Illness [name] is a 51yoF who comes to [facility] on 9/29 c LLE weakness. Pt thought to have presentation 2/2 MS flare up. PMH: HTN, COPD< OSA, MS, neurogenic bladder, chronic foley, depression, migraine, follicular lymphoma.    PT Comments    Pt reports feeling better. Wanting to go home vs SNF.  Marland KitchenParticipated in exercises as described below.  Good effort with ex.  She is able to transition to sitting EOB with heavy use of raily but no physical assist today.  Once sitting generally steady.  +2 assist for standing given cramping and pain LLE.  She is able to stand x 2 today with min/mod a x 2.  Relies heavily on walker with poor posture and unable to stand fully or step.  While standing she has a BM and care is provided.  Returned to supine with mod a x 1 for LE management.    Pt fatigued with effort and pain/cramping LLE limits progression.  She sated she left rehab when discharge but did not move at home which resulted in readmission.  While she voices being motivated for mobility and wanting to go home, she remains limited in her abilities and SNF recommended at discharge.  Unable to progress to chair given standing quality today but is generally improving from prior session.     Follow Up Recommendations  SNF     Equipment Recommendations  None recommended by PT    Recommendations for Other Services       Precautions / Restrictions Precautions Precautions: Fall Precaution Comments: cramping LLE Restrictions Weight Bearing Restrictions: No    Mobility  Bed Mobility Overal bed mobility: Needs Assistance Bed Mobility: Supine to Sit;Sit to Supine     Supine to sit: Min guard Sit to supine: Mod assist   General bed mobility comments: does quite well today with rails. needs assist to get LE's back onto bed  Transfers Overall transfer  level: Needs assistance Equipment used: Rolling walker (2 wheeled) Transfers: Sit to/from Stand Sit to Stand: +2 safety/equipment;Min assist            Ambulation/Gait             General Gait Details: unable   Stairs             Wheelchair Mobility    Modified Rankin (Stroke Patients Only)       Balance Overall balance assessment: Needs assistance Sitting-balance support: Feet supported;Bilateral upper extremity supported Sitting balance-Leahy Scale: Good Sitting balance - Comments: no LOB in sitting   Standing balance support: Bilateral upper extremity supported Standing balance-Leahy Scale: Poor Standing balance comment: unable to stand fully                            Cognition Arousal/Alertness: Awake/alert Behavior During Therapy: WFL for tasks assessed/performed Overall Cognitive Status: Within Functional Limits for tasks assessed                                        Exercises Other Exercises Other Exercises: BLE supine ex x 10 AROM, seated LAQ x 20    General Comments        Pertinent Vitals/Pain Pain Assessment: Faces Faces Pain Scale: Hurts even more Pain Location:  Left leg pain from hip to foot Pain Descriptors / Indicators: Cramping;Grimacing;Guarding Pain Intervention(s): Limited activity within patient's tolerance;Monitored during session;Repositioned    Home Living                      Prior Function            PT Goals (current goals can now be found in the care plan section) Progress towards PT goals: Progressing toward goals    Frequency    Min 2X/week      PT Plan Current plan remains appropriate    Co-evaluation              AM-PAC PT "6 Clicks" Mobility   Outcome Measure  Help needed turning from your back to your side while in a flat bed without using bedrails?: A Lot Help needed moving from lying on your back to sitting on the side of a flat bed without using  bedrails?: A Lot Help needed moving to and from a bed to a chair (including a wheelchair)?: Total Help needed standing up from a chair using your arms (e.g., wheelchair or bedside chair)?: A Lot Help needed to walk in hospital room?: Total Help needed climbing 3-5 steps with a railing? : Total 6 Click Score: 9    End of Session Equipment Utilized During Treatment: Gait belt Activity Tolerance: Patient limited by pain;Patient limited by fatigue Patient left: in bed;with call bell/phone within reach;with bed alarm set Nurse Communication: Mobility status;Other (comment)       Time: 1610-9604 PT Time Calculation (min) (ACUTE ONLY): 24 min  Charges:  $Therapeutic Exercise: 8-22 mins $Therapeutic Activity: 8-22 mins                    Chesley Noon, PTA 01/30/20, 1:06 PM

## 2020-01-30 NOTE — Progress Notes (Signed)
PROGRESS NOTE    Patient: [name]                            PCP: Kirk Ruths, MD                    DOB: 1951-03-16            DOA: 01/26/2020 VOH:[id number]             DOS: 01/30/2020, 1:01 PM   LOS: 4 days   Date of Service: The patient was seen and examined on 01/30/2020  Subjective:   The patient was seen and examined this morning, reporting improvement of right lower extremity strength, still having pain, difficulty ambulating.  Complaining of severe generalized weaknesses. Anxious hesitated but near discharge planning likely in 1 to 2 days   Brief Narrative:   [name] is a 69 y.o.F w PMh/o HTN, COPD, OSA, multiple sclerosis, neurogenic bladder with chronic indwelling Foley catheter, depression, migraine, follicular lymphoma treated in the past, presented at [facility] ED due to weakness of left lower extremity.   ED: Vitals were normal, LABs: Chronic anemia hemoglobin stable 11.8 UA cloudy, WBC count elevated, urine culture pending, blood culture pending, Covid negative CT head negative for any acute changes Chest x-ray negative for any acute findings Abdominal x-ray negative for any acute findings Left hip x-ray negative for any acute findings  Assessment & Plan:   Principal Problem:   Weakness of left leg Active Problems:   UTI (urinary tract infection)   COPD with bronchial hyperresponsiveness (HCC)   Essential hypertension   Neurogenic bladder   Multiple sclerosis (HCC)   Left leg weakness   Left lower extremity weakness could be secondary to MS flareup -Per patient strength and pain still improving -Continue IV Solu-Medrol 1 g daily for total of 4/5 days - per neurology recommendations  -Anticipating hyperglycemia, continue SSI coverage Resumed home medication teriflunomide -PT/OT recommended SNF Neurology Dr. Rick Duff follow-up recommendations -Pain medication modified by neurology-baclofen  UTI,  -Chronic indwelling  catheter-follow-up with urologist as an outpatient -Hemodynamically stable Patient has neurogenic bladder.  Foley catheter was replaced in the ED UA positive-urine culture>> multiple species, recultured - Antibiotics cefepime and vancomycin in the ED >> has been on IV Rocephin >>   Resumed Myrbetriq Blood cultures-no growth to date   AKI on CKD stage III,  creatinine slightly elevated 1.35 IV fluid given for overnight hydration, reassess tomorrow a.m. Cr; 1.35 >> 1.57 >>1.12 >> 1.15 >> 1.23 -Monitoring avoiding nephrotoxins  Hypertension,  -Remained stable on amlodipine, hydralazine -Holding lisinopril due to A on CKD, elevated creatinine    COPD, OSA, patient does not wear CPAP at home No exacerbation noted on exam Continue to monitor. -Remained stable   Depression and migraine, no acute issues -Stable on current meds  Acute on chronic pain exacerbated likely by MS -Baclofen ordered modified increased dose per neurology -Stable   Nutritional status:  Nutrition Problem: Increased nutrient needs Etiology: wound healing, chronic illness (stage II sacral and mid coccyx; COPD) Signs/Symptoms: estimated needs Interventions: MVI, Premier Protein  Cultures; Urine Culture  >>> multiple organism nonspecific Recollecting  01/28/2020 >>   Antimicrobials: 9/30/21cefepime and vancomycin in the ED 01/27/20 Started ceftriaxone 1 g IV daily    Consultants: Neuro    ---------------------------------------------------------------------------------------------------------------------------------------------  DVT prophylaxis:  SCD/Compression stockings and Lovenox SQ Code Status:   Code Status: Full Code Family Communication: Discussed  with patient in detail Admission status:    Status is: Inpatient  Remains inpatient appropriate because:Inpatient level of care appropriate due to severity of illness   Dispo: The patient is from: Home              Anticipated d/c  is to: Home with home health (home health. Face-to-face completed)              Anticipated d/c date is: PT OT recommended SNF, patient is reluctant  Will continue to offer SNF -but it looks like patient would like to go home with home health  In a.m. 01/31/2020               Patient currently is not medically stable to d/c.  Continue need above high-dose IV steroids, difficulty ambulating due to pain is comfortable....        Procedures:   No admission procedures for hospital encounter.     Antimicrobials:  Anti-infectives (From admission, onward)   Start     Dose/Rate Route Frequency Ordered Stop   01/27/20 0900  cefTRIAXone (ROCEPHIN) 1 g in sodium chloride 0.9 % 100 mL IVPB  Status:  Discontinued        1 g 200 mL/hr over 30 Minutes Intravenous Every 24 hours 01/26/20 2202 01/27/20 0449   01/27/20 0900  cefTRIAXone (ROCEPHIN) 2 g in sodium chloride 0.9 % 100 mL IVPB        2 g 200 mL/hr over 30 Minutes Intravenous Every 24 hours 01/27/20 0449 02/01/20 0859   01/26/20 1630  vancomycin (VANCOREADY) IVPB 1250 mg/250 mL        1,250 mg 166.7 mL/hr over 90 Minutes Intravenous  Once 01/26/20 1526 01/26/20 2022   01/26/20 1500  ceFEPIme (MAXIPIME) 2 g in sodium chloride 0.9 % 100 mL IVPB        2 g 200 mL/hr over 30 Minutes Intravenous  Once 01/26/20 1459 01/26/20 1638   01/26/20 1500  metroNIDAZOLE (FLAGYL) IVPB 500 mg        500 mg 100 mL/hr over 60 Minutes Intravenous  Once 01/26/20 1459 01/26/20 1801   01/26/20 1500  vancomycin (VANCOCIN) IVPB 1000 mg/200 mL premix        1,000 mg 200 mL/hr over 60 Minutes Intravenous  Once 01/26/20 1459 01/26/20 2227       Medication:  . acidophilus  1 capsule Oral TID  . atorvastatin  10 mg Oral QHS  . baclofen  10 mg Oral Daily  . baclofen  15 mg Oral BID  . buPROPion  300 mg Oral Daily  . Chlorhexidine Gluconate Cloth  6 each Topical Daily  . DULoxetine  60 mg Oral BH-[id number]  . enoxaparin (LOVENOX) injection  40 mg Subcutaneous  Q24H  . famotidine  20 mg Oral Daily  . fluticasone furoate-vilanterol  1 puff Inhalation Daily  . gabapentin  600 mg Oral TID  . insulin aspart  0-15 Units Subcutaneous TID WC  . insulin aspart  0-5 Units Subcutaneous QHS  . magnesium oxide  400 mg Oral Daily  . mirabegron ER  50 mg Oral Daily  . multivitamin with minerals  1 tablet Oral Daily  . multivitamin-lutein  1 capsule Oral Daily  . Ensure Max Protein  11 oz Oral Daily  . QUEtiapine Fumarate  150 mg Oral QHS  . sodium chloride flush  3 mL Intravenous Q12H  . sodium chloride flush  3 mL Intravenous Q12H  . Teriflunomide  14 mg Oral Daily  .  cyanocobalamin  1,000 mcg Oral Daily  . vortioxetine HBr  5 mg Oral Daily    sodium chloride, acetaminophen, benzocaine, clonazePAM, docusate sodium, hydrALAZINE, ondansetron **OR** ondansetron (ZOFRAN) IV, polyethylene glycol, sodium chloride flush   Objective:   Vitals:   01/29/20 0829 01/29/20 1510 01/29/20 2338 01/30/20 0743  BP: (!) 130/57 (!) 142/69 (!) 113/50 (!) 144/69  Pulse: 61 69 (!) 59 66  Resp: 17 16 18 17   Temp: 97.8 F (36.6 C) 98.2 F (36.8 C) 98 F (36.7 C) 97.9 F (36.6 C)  TempSrc: Oral Oral Oral Oral  SpO2: 100% 100% 99% 100%  Weight:      Height:        Intake/Output Summary (Last 24 hours) at 01/30/2020 1301 Last data filed at 01/30/2020 1025 Gross per 24 hour  Intake 240 ml  Output 1550 ml  Net -1310 ml   Filed Weights   01/26/20 1452  Weight: 117.9 kg     Examination:        Physical Exam:   General:  Alert, oriented, cooperative, no distress;   HEENT:  Normocephalic, PERRL, otherwise with in Normal limits   Neuro:  CNII-XII intact. , normal motor and sensation, reflexes intact   Lungs:   Clear to auscultation BL, Respirations unlabored, no wheezes / crackles  Cardio:    S1/S2, RRR, No murmure, No Rubs or Gallops   Abdomen:   Soft, non-tender, bowel sounds active all four quadrants,  no guarding or peritoneal signs.  Muscular  skeletal:   Severe generalized weaknesses, left leg pain-weakness  Otherwise Limited exam - in bed, able to move all 3 extremities, Normal strength,  2+ pulses,  symmetric, No pitting edema  Skin:  Dry, warm to touch, negative for any Rashes, No open wounds  Wounds: Please see nursing documentation Pressure Injury 10/20/19 Buttocks Left Stage 2 -  Partial thickness loss of dermis presenting as a shallow open injury with a red, pink wound bed without slough. (Active)  10/20/19 2125  Location: Buttocks  Location Orientation: Left  Staging: Stage 2 -  Partial thickness loss of dermis presenting as a shallow open injury with a red, pink wound bed without slough.  Wound Description (Comments):   Present on Admission:      Pressure Injury 10/20/19 Coccyx Mid Stage 2 -  Partial thickness loss of dermis presenting as a shallow open injury with a red, pink wound bed without slough. (Active)  10/20/19 2125  Location: Coccyx  Location Orientation: Mid  Staging: Stage 2 -  Partial thickness loss of dermis presenting as a shallow open injury with a red, pink wound bed without slough.  Wound Description (Comments):   Present on Admission:         Chronic Foley cath in place      ------------------------------------------------------------------------------------------------------------------------------------------    LABs:  CBC Latest Ref Rng & Units 01/30/2020 01/29/2020 01/28/2020  WBC 4.0 - 10.5 K/uL 5.1 6.2 8.5  Hemoglobin 12.0 - 15.0 g/dL 8.9(L) 8.2(L) 9.5(L)  Hematocrit 36 - 46 % 27.1(L) 24.8(L) 27.9(L)  Platelets 150 - 400 K/uL 169 143(L) 130(L)   CMP Latest Ref Rng & Units 01/30/2020 01/29/2020 01/28/2020  Glucose 70 - 99 mg/dL 163(H) 150(H) 154(H)  BUN 8 - 23 mg/dL 35(H) 31(H) 24(H)  Creatinine 0.44 - 1.00 mg/dL 1.23(H) 1.15(H) 1.12(H)  Sodium 135 - 145 mmol/L 139 135 139  Potassium 3.5 - 5.1 mmol/L 4.2 3.9 4.2  Chloride 98 - 111 mmol/L 104 103 105  CO2 22 -  32 mmol/L 24 21(L) 23   Calcium 8.9 - 10.3 mg/dL 9.4 9.1 8.8(L)  Total Protein 6.5 - 8.1 g/dL - - -  Total Bilirubin 0.3 - 1.2 mg/dL - - -  Alkaline Phos 38 - 126 U/L - - -  AST 15 - 41 U/L - - -  ALT 0 - 44 U/L - - -       Micro Results Recent Results (from the past 240 hour(s))  Blood Culture (routine x 2)     Status: None (Preliminary result)   Collection Time: 01/26/20  3:04 PM   Specimen: BLOOD  Result Value Ref Range Status   Specimen Description BLOOD RIGHT ANTECUBITAL  Final   Special Requests   Final    BOTTLES DRAWN AEROBIC AND ANAEROBIC Blood Culture adequate volume   Culture   Final    NO GROWTH 4 DAYS Performed at [facility], [address] [place], [place] [address]    Report Status PENDING  Incomplete  Blood Culture (routine x 2)     Status: None (Preliminary result)   Collection Time: 01/26/20  3:04 PM   Specimen: BLOOD  Result Value Ref Range Status   Specimen Description BLOOD RIGHT ANTECUBITAL  Final   Special Requests   Final    BOTTLES DRAWN AEROBIC AND ANAEROBIC Blood Culture adequate volume   Culture   Final    NO GROWTH 4 DAYS Performed at [facility], [address], [place],  [address]    Report Status PENDING  Incomplete  Respiratory Panel by RT PCR (Flu A&B, Covid) - Nasopharyngeal Swab     Status: None   Collection Time: 01/26/20  5:36 PM   Specimen: Nasopharyngeal Swab  Result Value Ref Range Status   SARS Coronavirus 2 by RT PCR NEGATIVE NEGATIVE Final    Comment: (NOTE) SARS-CoV-2 target nucleic acids are NOT DETECTED.  The SARS-CoV-2 RNA is generally detectable in upper respiratoy specimens during the acute phase of infection. The lowest concentration of SARS-CoV-2 viral copies this assay can detect is 131 copies/mL. A negative result does not preclude SARS-Cov-2 infection and should not be used as the sole basis for treatment or other patient management decisions. A negative result may occur with  improper specimen  collection/handling, submission of specimen other than nasopharyngeal swab, presence of viral mutation(s) within the areas targeted by this assay, and inadequate number of viral copies (<131 copies/mL). A negative result must be combined with clinical observations, patient history, and epidemiological information. The expected result is Negative.  Fact Sheet for Patients:  PinkCheek.be  Fact Sheet for Healthcare Providers:  GravelBags.it  This test is no t yet approved or cleared by the [place] FDA and  has been authorized for detection and/or diagnosis of SARS-CoV-2 by FDA under an Emergency Use Authorization (EUA). This EUA will remain  in effect (meaning this test can be used) for the duration of the COVID-19 declaration under Section 564(b)(1) of the Act, 21 U.S.C. section 360bbb-3(b)(1), unless the authorization is terminated or revoked sooner.     Influenza A by PCR NEGATIVE NEGATIVE Final   Influenza B by PCR NEGATIVE NEGATIVE Final    Comment: (NOTE) The Xpert Xpress SARS-CoV-2/FLU/RSV assay is intended as an aid in  the diagnosis of influenza from Nasopharyngeal swab specimens and  should not be used as a sole basis for treatment. Nasal washings and  aspirates are unacceptable for Xpert Xpress SARS-CoV-2/FLU/RSV  testing.  Fact Sheet for Patients: PinkCheek.be  Fact  Sheet for Healthcare Providers: GravelBags.it  This test is not yet approved or cleared by the [place] and  has been authorized for detection and/or diagnosis of SARS-CoV-2 by  FDA under an Emergency Use Authorization (EUA). This EUA will remain  in effect (meaning this test can be used) for the duration of the  Covid-19 declaration under Section 564(b)(1) of the Act, 21  U.S.C. section 360bbb-3(b)(1), unless the authorization is  terminated or revoked. Performed at [facility], [address] [place], [place] [address]   Urine culture     Status: Abnormal   Collection Time: 01/26/20  8:02 PM   Specimen: In/Out Cath Urine  Result Value Ref Range Status   Specimen Description   Final    IN/OUT CATH URINE Performed at [facility], [address], [place], [place] [address]    Special Requests   Final    NONE Performed at [facility], [address] [place], [place] [address]    Culture MULTIPLE SPECIES PRESENT, SUGGEST RECOLLECTION (A)  Final   Report Status 01/27/2020 FINAL  Final    Radiology Reports CT HEAD WO CONTRAST  Result Date: 01/26/2020 CLINICAL DATA:  Weakness. EXAM: CT HEAD WITHOUT CONTRAST TECHNIQUE: Contiguous axial images were obtained from the base of the skull through the vertex without intravenous contrast. COMPARISON:  June 20, 2019 FINDINGS: Brain: There is moderate severity cerebral atrophy with widening of the extra-axial spaces and ventricular dilatation. There are areas of decreased attenuation within the white matter tracts of the supratentorial brain, consistent with microvascular disease changes. Vascular: No hyperdense vessel or unexpected calcification. Skull: Normal. Negative for fracture or focal lesion. Sinuses/Orbits: No acute finding. Other: None. IMPRESSION: 1. Generalized cerebral atrophy. 2. No acute intracranial abnormality. Electronically Signed   By: Virgina Norfolk M.D.   On: 01/26/2020 16:27   MR BRAIN W WO CONTRAST  Result Date: 01/27/2020 CLINICAL DATA:  Multiple sclerosis, new event. Left lower extremity weakness. EXAM: MRI HEAD WITHOUT AND WITH CONTRAST TECHNIQUE: Multiplanar, multiecho pulse sequences of the brain and surrounding structures were obtained without and with intravenous contrast. CONTRAST:  72mL GADAVIST GADOBUTROL 1 MMOL/ML IV SOLN COMPARISON:  Head MRI 11/09/2018 FINDINGS: Brain: No acute infarct, mass, midline shift, or extra-axial fluid collection is  identified. A chronic microhemorrhage near the genu of the left internal capsule is unchanged. Moderate cerebral atrophy is unchanged. Extensive T2 and FLAIR hyperintensity in the cerebral white matter bilaterally is unchanged including confluent periventricular signal abnormality and multiple juxtacortical lesions. There is diffuse FLAIR hyperintensity along the callososeptal interface with diffuse callosal thinning. There is no abnormal enhancement or restricted diffusion. A chronic left cerebellar infarct is unchanged. There is moderate cerebral atrophy. Vascular: Major intracranial vascular flow voids are preserved. Skull and upper cervical spine: Unremarkable bone marrow signal. Sinuses/Orbits: Unremarkable orbits. Trace bilateral mastoid fluid. Clear paranasal sinuses. Other: None. IMPRESSION: Unchanged extensive cerebral white matter disease consistent with multiple sclerosis. No evidence of active demyelination or other acute intracranial abnormality. Electronically Signed   By: Logan Bores M.D.   On: 01/27/2020 18:52   CT ABDOMEN PELVIS W CONTRAST  Result Date: 12/31/2019 CLINICAL DATA:  Chronic Foley catheter in place.  Fever. EXAM: CT ABDOMEN AND PELVIS WITH CONTRAST TECHNIQUE: Multidetector CT imaging of the abdomen and pelvis was performed using the standard protocol following bolus administration of intravenous contrast. CONTRAST:  160mL OMNIPAQUE IOHEXOL 300 MG/ML  SOLN COMPARISON:  10/17/2019 FINDINGS: Lower chest: No acute abnormality. Hepatobiliary: No  focal hepatic abnormality. Gallbladder unremarkable. Pancreas: No focal abnormality or ductal dilatation. Spleen: No focal abnormality.  Normal size. Adrenals/Urinary Tract: Mild fullness of the renal collecting systems bilaterally. Foley catheter in place within the bladder which is decompressed. Adrenal glands unremarkable. Stomach/Bowel: Stomach, large and small bowel grossly unremarkable. Vascular/Lymphatic: Calcified aorta and iliac vessels.  No evidence of aneurysm or adenopathy. Reproductive: 6 cm septated cyst within the left ovary, stable since prior study. Uterus and right ovary unremarkable. Other: No free fluid or free air. Musculoskeletal: Postoperative changes and degenerative changes in the lumbar spine. No acute bony abnormality. IMPRESSION: Fullness of the renal collecting systems bilaterally, similar to prior study. No stones or overt hydronephrosis. Urinary bladder decompressed. 6 cm left ovarian complex septated cyst. This was seen on prior study, measuring larger, likely related to position and scan planes. No significant change suspected. This could be further characterized with ultrasound or MRI. Aortic atherosclerosis. Electronically Signed   By: Rolm Baptise M.D.   On: 12/31/2019 23:26   DG Chest Port 1 View  Result Date: 01/26/2020 CLINICAL DATA:  Weakness. EXAM: PORTABLE CHEST 1 VIEW COMPARISON:  November 19, 2019 FINDINGS: Mild diffuse chronic appearing increased lung markings are seen. There is no evidence of acute infiltrate, pleural effusion or pneumothorax. The heart size and mediastinal contours are within normal limits. The visualized skeletal structures are unremarkable. IMPRESSION: No acute or active cardiopulmonary disease. Electronically Signed   By: Virgina Norfolk M.D.   On: 01/26/2020 16:24   DG Abd 2 Views  Result Date: 01/26/2020 CLINICAL DATA:  Left hip pain, weakness, fall EXAM: X-RAY ABDOMEN 2 VIEWS COMPARISON:  None. FINDINGS: Nonobstructive bowel gas pattern. No free air or organomegaly. No confluent opacity within the lungs. No effusions. Heart is borderline in size. Scoliosis and degenerative changes in the thoracolumbar spine. Posterior fusion changes in the lower lumbar spine. IMPRESSION: No acute findings. Electronically Signed   By: Rolm Baptise M.D.   On: 01/26/2020 16:45   DG Hip Unilat W or Wo Pelvis 2-3 Views Left  Result Date: 01/26/2020 CLINICAL DATA:  Left hip pain after fall EXAM: DG HIP  (WITH OR WITHOUT PELVIS) 2-3V LEFT COMPARISON:  None. FINDINGS: No acute bony abnormality. Specifically, no fracture, subluxation, or dislocation. IMPRESSION: No acute bony abnormality. Electronically Signed   By: Rolm Baptise M.D.   On: 01/26/2020 16:44    SIGNED: Deatra James, MD, FACP, FHM. [facility],  Pager (please use [email or web address] to page/text)  If 7PM-7AM, please contact night-coverage [email or web address], Password [facility] 01/30/2020, 1:01 PM

## 2020-01-31 DIAGNOSIS — I10 Essential (primary) hypertension: Secondary | ICD-10-CM | POA: Diagnosis not present

## 2020-01-31 LAB — BASIC METABOLIC PANEL
Anion gap: 7 (ref 5–15)
BUN: 36 mg/dL — ABNORMAL HIGH (ref 8–23)
CO2: 28 mmol/L (ref 22–32)
Calcium: 8.9 mg/dL (ref 8.9–10.3)
Chloride: 104 mmol/L (ref 98–111)
Creatinine, Ser: 1.16 mg/dL — ABNORMAL HIGH (ref 0.44–1.00)
GFR calc Af Amer: 56 mL/min — ABNORMAL LOW (ref >60)
GFR calc non Af Amer: 48 mL/min — ABNORMAL LOW (ref >60)
Glucose, Bld: 149 mg/dL — ABNORMAL HIGH (ref 70–99)
Potassium: 4.4 mmol/L (ref 3.5–5.1)
Sodium: 139 mmol/L (ref 135–145)

## 2020-01-31 LAB — CBC
HCT: 25.5 % — ABNORMAL LOW (ref 36.0–46.0)
Hemoglobin: 8.2 g/dL — ABNORMAL LOW (ref 12.0–15.0)
MCH: 27.8 pg (ref 26.0–34.0)
MCHC: 32.2 g/dL (ref 30.0–36.0)
MCV: 86.4 fL (ref 80.0–100.0)
Platelets: 163 10*3/uL (ref 150–400)
RBC: 2.95 MIL/uL — ABNORMAL LOW (ref 3.87–5.11)
RDW: 15 % (ref 11.5–15.5)
WBC: 5.4 10*3/uL (ref 4.0–10.5)
nRBC: 0.6 % — ABNORMAL HIGH (ref 0.0–0.2)

## 2020-01-31 LAB — CULTURE, BLOOD (ROUTINE X 2)
Culture: NO GROWTH
Culture: NO GROWTH
Special Requests: ADEQUATE
Special Requests: ADEQUATE

## 2020-01-31 LAB — GLUCOSE, CAPILLARY
Glucose-Capillary: 130 mg/dL — ABNORMAL HIGH (ref 70–99)
Glucose-Capillary: 164 mg/dL — ABNORMAL HIGH (ref 70–99)
Glucose-Capillary: 164 mg/dL — ABNORMAL HIGH (ref 70–99)
Glucose-Capillary: 207 mg/dL — ABNORMAL HIGH (ref 70–99)
Glucose-Capillary: 183 mg/dL — ABNORMAL HIGH (ref 70–99)

## 2020-01-31 NOTE — Progress Notes (Signed)
Occupational Therapy Treatment Patient Details Name: [name] MRN: [id number] DOB: 1951-03-29 Today's Date: 01/31/2020    History of present illness [name] is a 46yoF who comes to [facility] on 9/29 c LLE weakness. Pt thought to have presentation 2/2 MS flare up. PMH: HTN, COPD< OSA, MS, neurogenic bladder, chronic foley, depression, migraine, follicular lymphoma.   OT comments  Ms Devora was seen for OT treatment on this date, overlapping c PT for safe mobility. Upon arrival to room pt reclined in bed agreeable to tx. MAX A don B socks at bed level. MIN A for UB bathing seated EOB. MIN A to exit R side of bed. MOD A x2 + bariatric RW for x4 standing trials at EOB. Pt requires B foot block, tolerated ~20" initially improving to ~2' with tactile/verbal cues. Pt unable to weight shift for steps, constant cues for safe t/f technique. Left in bed c PT. Pt verbalized understanding of instruction provided. Pt making good progress toward goals. Pt continues to benefit from skilled OT services to maximize return to PLOF and minimize risk of future falls, injury, caregiver burden, and readmission. Will continue to follow POC. Discharge recommendation remains appropriate.    Follow Up Recommendations  SNF    Equipment Recommendations  Other (comment) (defer to next level of care)    Recommendations for Other Services      Precautions / Restrictions Precautions Precautions: Fall Restrictions Weight Bearing Restrictions: No       Mobility Bed Mobility Overal bed mobility: Needs Assistance Bed Mobility: Supine to Sit     Supine to sit: Min assist;HOB elevated Sit to supine: Max assist   General bed mobility comments: heavy use of rails  Transfers Overall transfer level: Needs assistance Equipment used: Rolling walker (2 wheeled) (bariatric RW) Transfers: Sit to/from Stand Sit to Stand: +2 physical assistance;Mod assist    General transfer comment: x4 standing trials, tolerated ~20"  initially improving to ~2' c tactile/verbal cues. Pt unable to weight shift, requires B foot block    Balance Overall balance assessment: Needs assistance Sitting-balance support: Feet supported;Bilateral upper extremity supported Sitting balance-Leahy Scale: Good     Standing balance support: Bilateral upper extremity supported Standing balance-Leahy Scale: Poor Standing balance comment: physical assist and cues for static standing            ADL either performed or assessed with clinical judgement   ADL Overall ADL's : Needs assistance/impaired            General ADL Comments: MAX A don B socks at bed level. MIN A UB bathing seated EOB. MAX A x2 + RW for pericare in standing               Cognition Arousal/Alertness: Awake/alert Behavior During Therapy: Impulsive Overall Cognitive Status: Within Functional Limits for tasks assessed      General Comments: Increased time and repetition to follow one step commands         Exercises Exercises: Other exercises Other Exercises Other Exercises: Pt educated re: DME recs, d/c recs, ECS, falls prevention, HEP Other Exercises: Bathing, pericare, sup>sit, sit<>stand x4, lateral scoot, sitting/standing balance/tolerance           Pertinent Vitals/ Pain       Pain Assessment: Faces Faces Pain Scale: Hurts even more Pain Location: left leg  Pain Descriptors / Indicators: Cramping;Grimacing Pain Intervention(s): Limited activity within patient's tolerance;Repositioned;Monitored during session         Frequency  Min 1X/week  Progress Toward Goals  OT Goals(current goals can now be found in the care plan section)  Progress towards OT goals: Progressing toward goals  Acute Rehab OT Goals Patient Stated Goal: to stand and get out of bed  OT Goal Formulation: With patient Time For Goal Achievement: 02/10/20 Potential to Achieve Goals: Good ADL Goals Pt Will Perform Grooming: with modified  independence;sitting Pt Will Transfer to Toilet: with min guard assist;ambulating;bedside commode Pt Will Perform Toileting - Clothing Manipulation and hygiene: with min guard assist;sit to/from stand  Plan Discharge plan remains appropriate;Frequency remains appropriate    Co-evaluation    PT/OT/SLP Co-Evaluation/Treatment: Yes Reason for Co-Treatment: To address functional/ADL transfers;For patient/therapist safety PT goals addressed during session: Mobility/safety with mobility OT goals addressed during session: ADL's and self-care;Proper use of Adaptive equipment and DME      AM-PAC OT "6 Clicks" Daily Activity     Outcome Measure   Help from another person eating meals?: None Help from another person taking care of personal grooming?: A Little Help from another person toileting, which includes using toliet, bedpan, or urinal?: A Lot Help from another person bathing (including washing, rinsing, drying)?: A Lot Help from another person to put on and taking off regular upper body clothing?: A Little Help from another person to put on and taking off regular lower body clothing?: A Lot 6 Click Score: 16    End of Session Equipment Utilized During Treatment: Rolling walker  OT Visit Diagnosis: Unsteadiness on feet (R26.81);Repeated falls (R29.6);History of falling (Z91.81);Muscle weakness (generalized) (M62.81)   Activity Tolerance Patient tolerated treatment well   Patient Left in bed;with call bell/phone within reach;with nursing/sitter in room;Other (comment) (PT in room at end of session)   Nurse Communication Mobility status        Time: 9179-1505 OT Time Calculation (min): 32 min  Charges: OT General Charges $OT Visit: 1 Visit OT Treatments $Self Care/Home Management : 8-22 mins $Therapeutic Activity: 8-22 mins  [name], M.S. OTR/L  01/31/20, 2:01 PM  ascom [phone]

## 2020-01-31 NOTE — Care Management Important Message (Signed)
Important Message  Patient Details  Name: [name] MRN: [id number] Date of Birth: Sep 11, 1950   Medicare Important Message Given:  Yes     Dannette Barbara 01/31/2020, 11:13 AM

## 2020-01-31 NOTE — Progress Notes (Signed)
PROGRESS NOTE    Patient: [name]                            PCP: Kirk Ruths, MD                    DOB: Jul 20, 1950            DOA: 01/26/2020 VCB:[id number]             DOS: 01/31/2020, 12:01 PM   LOS: 5 days   Date of Service: The patient was seen and examined on 01/31/2020  Subjective:   The patient was seen and examined this morning, stable no acute distress still complaining of severe generalized weakness especially left lower leg, with pain.  Difficulty ambulating difficulty with ADLs.  Patient was made aware that PT OT has recommended SNF.  She was initially refusing After extensive discussion this morning she is agreeable to pursue with placement. She was told that we will notify the social worker and start the process.   Brief Narrative:   [name] is a 69 y.o.F w PMh/o HTN, COPD, OSA, multiple sclerosis, neurogenic bladder with chronic indwelling Foley catheter, depression, migraine, follicular lymphoma treated in the past, presented at [facility] ED due to weakness of left lower extremity.   ED: Vitals were normal, LABs: Chronic anemia hemoglobin stable 11.8 UA cloudy, WBC count elevated, urine culture pending, blood culture pending, Covid negative CT head negative for any acute changes Chest x-ray negative for any acute findings Abdominal x-ray negative for any acute findings Left hip x-ray negative for any acute findings  Assessment & Plan:   Principal Problem:   Weakness of left leg Active Problems:   UTI (urinary tract infection)   COPD with bronchial hyperresponsiveness (HCC)   Essential hypertension   Neurogenic bladder   Multiple sclerosis (HCC)   Left leg weakness   Left lower extremity weakness could be secondary to MS flareup -Per patient still having pain but strength has improved -Continue IV Solu-Medrol 1 g ... Status post 5-day treatment- per neurology recommendations  -Blood sugar stayed stable, Resumed home medication  teriflunomide -PT/OT recommended SNF >>> patient is now agreeable Neurology Dr. Rick Duff follow-up recommendations -Pain medication modified by neurology-baclofen  UTI,  -Chronic indwelling catheter-follow-up with urologist as an outpatient -Hemodynamically stable Patient has neurogenic bladder.  Foley catheter was replaced in the ED UA positive-urine culture>> multiple species, recultured - Antibiotics cefepime and vancomycin in the ED >> has been on IV Rocephin >>   Resumed Myrbetriq Blood cultures-no growth to date   AKI on CKD stage III,  creatinine slightly elevated 1.35 -Improved, status post IV fluid hydration Cr; 1.35 >> 1.57 >>1.12 >> 1.15 >> 1.23 -Monitoring avoiding nephrotoxins  Hypertension,  -Remained stable on amlodipine, hydralazine -Holding lisinopril due to A on CKD, elevated creatinine -Stable    COPD, OSA, patient does not wear CPAP at home No exacerbation noted on exam Continue to monitor. Stable off supplemental oxygen  Depression and migraine, no acute issues -Stable on current meds  Severe debility/generalized weakness acute on chronic pain exacerbated likely by MS -Baclofen ordered modified increased dose per neurology -Continue PT OT which recommended SNF placement -Patient is now agreeable, initiating process today 01/31/2020   Nutritional status:  Nutrition Problem: Increased nutrient needs Etiology: wound healing, chronic illness (stage II sacral and mid coccyx; COPD) Signs/Symptoms: estimated needs Interventions: MVI, Premier Protein  Cultures; Urine Culture  >>>  multiple organism nonspecific Recollecting  01/28/2020 >>   Antimicrobials: 9/30/21cefepime and vancomycin in the ED 01/27/20 Started ceftriaxone 1 g IV daily    Consultants: Neuro    ---------------------------------------------------------------------------------------------------------------------------------------------  DVT prophylaxis:   SCD/Compression stockings and Lovenox SQ Code Status:   Code Status: Full Code Family Communication: Discussed with patient in detail regarding her comorbidities, severe debility, initially was refusing SNF now agreeable Admission status:    Status is: Inpatient  Remains inpatient appropriate because:Inpatient level of care appropriate due to severity of illness   Dispo: The patient is from: Home              Anticipated d/c is to: Home with home health (home health. Face-to-face completed)              Anticipated d/c date is: PT OT recommended SNF, patient was reluctant, was refusing SNF today she is agreeable we will initiate the process appreciate social worker assistance..     Will continue to offer SNF - 01/31/2020 >>>>               As of today 01/31/2020 patient stable to be discharged to SNF.    Procedures:   No admission procedures for hospital encounter.     Antimicrobials:  Anti-infectives (From admission, onward)   Start     Dose/Rate Route Frequency Ordered Stop   01/27/20 0900  cefTRIAXone (ROCEPHIN) 1 g in sodium chloride 0.9 % 100 mL IVPB  Status:  Discontinued        1 g 200 mL/hr over 30 Minutes Intravenous Every 24 hours 01/26/20 2202 01/27/20 0449   01/27/20 0900  cefTRIAXone (ROCEPHIN) 2 g in sodium chloride 0.9 % 100 mL IVPB        2 g 200 mL/hr over 30 Minutes Intravenous Every 24 hours 01/27/20 0449 01/31/20 1151   01/26/20 1630  vancomycin (VANCOREADY) IVPB 1250 mg/250 mL        1,250 mg 166.7 mL/hr over 90 Minutes Intravenous  Once 01/26/20 1526 01/26/20 2022   01/26/20 1500  ceFEPIme (MAXIPIME) 2 g in sodium chloride 0.9 % 100 mL IVPB        2 g 200 mL/hr over 30 Minutes Intravenous  Once 01/26/20 1459 01/26/20 1638   01/26/20 1500  metroNIDAZOLE (FLAGYL) IVPB 500 mg        500 mg 100 mL/hr over 60 Minutes Intravenous  Once 01/26/20 1459 01/26/20 1801   01/26/20 1500  vancomycin (VANCOCIN) IVPB 1000 mg/200 mL premix        1,000 mg 200 mL/hr  over 60 Minutes Intravenous  Once 01/26/20 1459 01/26/20 2227       Medication:  . atorvastatin  10 mg Oral QHS  . baclofen  15 mg Oral BID  . buPROPion  300 mg Oral Daily  . Chlorhexidine Gluconate Cloth  6 each Topical Daily  . DULoxetine  60 mg Oral BH-q7a  . enoxaparin (LOVENOX) injection  40 mg Subcutaneous Q24H  . fluticasone furoate-vilanterol  1 puff Inhalation Daily  . gabapentin  600 mg Oral TID  . insulin aspart  0-15 Units Subcutaneous TID WC  . insulin aspart  0-5 Units Subcutaneous QHS  . magnesium oxide  400 mg Oral Daily  . mirabegron ER  50 mg Oral Daily  . multivitamin with minerals  1 tablet Oral Daily  . multivitamin-lutein  1 capsule Oral Daily  . Ensure Max Protein  11 oz Oral Daily  . QUEtiapine Fumarate  150 mg Oral  QHS  . sodium chloride flush  3 mL Intravenous Q12H  . sodium chloride flush  3 mL Intravenous Q12H  . Teriflunomide  14 mg Oral Daily  . cyanocobalamin  1,000 mcg Oral Daily  . vortioxetine HBr  5 mg Oral Daily    sodium chloride, acetaminophen, benzocaine, clonazePAM, docusate sodium, hydrALAZINE, ondansetron **OR** ondansetron (ZOFRAN) IV, polyethylene glycol, sodium chloride flush   Objective:   Vitals:   01/30/20 0743 01/30/20 1624 01/30/20 2320 01/31/20 0752  BP: (!) 144/69 (!) 139/56 (!) 139/57 138/66  Pulse: 66 71 (!) 59 64  Resp: 17 20 18 16   Temp: 97.9 F (36.6 C) 98.3 F (36.8 C) 97.7 F (36.5 C) 97.8 F (36.6 C)  TempSrc: Oral Oral Oral Oral  SpO2: 100% 98% 96% 96%  Weight:      Height:        Intake/Output Summary (Last 24 hours) at 01/31/2020 1201 Last data filed at 01/31/2020 1003 Gross per 24 hour  Intake 360 ml  Output 1700 ml  Net -1340 ml   Filed Weights   01/26/20 1452  Weight: 117.9 kg     Examination:         Physical Exam:   General:  Alert, oriented, cooperative, no distress;   HEENT:  Normocephalic, PERRL, otherwise with in Normal limits   Neuro:  CNII-XII intact. , normal motor and  sensation, reflexes intact   Lungs:   Clear to auscultation BL, Respirations unlabored, no wheezes / crackles  Cardio:    S1/S2, RRR, No murmure, No Rubs or Gallops   Abdomen:   Soft, non-tender, bowel sounds active all four quadrants,  no guarding or peritoneal signs.  Muscular skeletal:   Severe generalized weakness especially in lower extremities Limited exam - in bed, able to move all 4 extremities, difficulty ambulating with assist 2+ pulses,  symmetric, No pitting edema  Skin:  Dry, warm to touch, negative for any Rashes, No open wounds  Wounds: Please see nursing documentation Pressure Injury 10/20/19 Buttocks Left Stage 2 -  Partial thickness loss of dermis presenting as a shallow open injury with a red, pink wound bed without slough. (Active)  10/20/19 2125  Location: Buttocks  Location Orientation: Left  Staging: Stage 2 -  Partial thickness loss of dermis presenting as a shallow open injury with a red, pink wound bed without slough.  Wound Description (Comments):   Present on Admission:      Pressure Injury 10/20/19 Coccyx Mid Stage 2 -  Partial thickness loss of dermis presenting as a shallow open injury with a red, pink wound bed without slough. (Active)  10/20/19 2125  Location: Coccyx  Location Orientation: Mid  Staging: Stage 2 -  Partial thickness loss of dermis presenting as a shallow open injury with a red, pink wound bed without slough.  Wound Description (Comments):   Present on Admission:         Chronic Foley cath in place      ------------------------------------------------------------------------------------------------------------------------------------------    LABs:  CBC Latest Ref Rng & Units 01/31/2020 01/30/2020 01/29/2020  WBC 4.0 - 10.5 K/uL 5.4 5.1 6.2  Hemoglobin 12.0 - 15.0 g/dL 8.2(L) 8.9(L) 8.2(L)  Hematocrit 36 - 46 % 25.5(L) 27.1(L) 24.8(L)  Platelets 150 - 400 K/uL 163 169 143(L)   CMP Latest Ref Rng & Units 01/31/2020 01/30/2020  01/29/2020  Glucose 70 - 99 mg/dL 149(H) 163(H) 150(H)  BUN 8 - 23 mg/dL 36(H) 35(H) 31(H)  Creatinine 0.44 - 1.00 mg/dL 1.16(H) 1.23(H)  1.15(H)  Sodium 135 - 145 mmol/L 139 139 135  Potassium 3.5 - 5.1 mmol/L 4.4 4.2 3.9  Chloride 98 - 111 mmol/L 104 104 103  CO2 22 - 32 mmol/L 28 24 21(L)  Calcium 8.9 - 10.3 mg/dL 8.9 9.4 9.1  Total Protein 6.5 - 8.1 g/dL - - -  Total Bilirubin 0.3 - 1.2 mg/dL - - -  Alkaline Phos 38 - 126 U/L - - -  AST 15 - 41 U/L - - -  ALT 0 - 44 U/L - - -       Micro Results Recent Results (from the past 240 hour(s))  Blood Culture (routine x 2)     Status: None   Collection Time: 01/26/20  3:04 PM   Specimen: BLOOD  Result Value Ref Range Status   Specimen Description BLOOD RIGHT ANTECUBITAL  Final   Special Requests   Final    BOTTLES DRAWN AEROBIC AND ANAEROBIC Blood Culture adequate volume   Culture   Final    NO GROWTH 5 DAYS Performed at [facility], [address] [place], [place] [address]    Report Status 01/31/2020 FINAL  Final  Blood Culture (routine x 2)     Status: None   Collection Time: 01/26/20  3:04 PM   Specimen: BLOOD  Result Value Ref Range Status   Specimen Description BLOOD RIGHT ANTECUBITAL  Final   Special Requests   Final    BOTTLES DRAWN AEROBIC AND ANAEROBIC Blood Culture adequate volume   Culture   Final    NO GROWTH 5 DAYS Performed at [facility], [address] [place], [place] [address]    Report Status 01/31/2020 FINAL  Final  Respiratory Panel by RT PCR (Flu A&B, Covid) - Nasopharyngeal Swab     Status: None   Collection Time: 01/26/20  5:36 PM   Specimen: Nasopharyngeal Swab  Result Value Ref Range Status   SARS Coronavirus 2 by RT PCR NEGATIVE NEGATIVE Final    Comment: (NOTE) SARS-CoV-2 target nucleic acids are NOT DETECTED.  The SARS-CoV-2 RNA is generally detectable in upper respiratoy specimens during the acute phase of infection. The lowest concentration of SARS-CoV-2 viral  copies this assay can detect is 131 copies/mL. A negative result does not preclude SARS-Cov-2 infection and should not be used as the sole basis for treatment or other patient management decisions. A negative result may occur with  improper specimen collection/handling, submission of specimen other than nasopharyngeal swab, presence of viral mutation(s) within the areas targeted by this assay, and inadequate number of viral copies (<131 copies/mL). A negative result must be combined with clinical observations, patient history, and epidemiological information. The expected result is Negative.  Fact Sheet for Patients:  PinkCheek.be  Fact Sheet for Healthcare Providers:  GravelBags.it  This test is no t yet approved or cleared by the [place] FDA and  has been authorized for detection and/or diagnosis of SARS-CoV-2 by FDA under an Emergency Use Authorization (EUA). This EUA will remain  in effect (meaning this test can be used) for the duration of the COVID-19 declaration under Section 564(b)(1) of the Act, 21 U.S.C. section 360bbb-3(b)(1), unless the authorization is terminated or revoked sooner.     Influenza A by PCR NEGATIVE NEGATIVE Final   Influenza B by PCR NEGATIVE NEGATIVE Final    Comment: (NOTE) The Xpert Xpress SARS-CoV-2/FLU/RSV assay is intended as an aid in  the diagnosis of influenza from Nasopharyngeal swab specimens and  should not  be used as a sole basis for treatment. Nasal washings and  aspirates are unacceptable for Xpert Xpress SARS-CoV-2/FLU/RSV  testing.  Fact Sheet for Patients: PinkCheek.be  Fact Sheet for Healthcare Providers: GravelBags.it  This test is not yet approved or cleared by the [place] FDA and  has been authorized for detection and/or diagnosis of SARS-CoV-2 by  FDA under an Emergency Use Authorization (EUA). This  EUA will remain  in effect (meaning this test can be used) for the duration of the  Covid-19 declaration under Section 564(b)(1) of the Act, 21  U.S.C. section 360bbb-3(b)(1), unless the authorization is  terminated or revoked. Performed at [facility], [address], [place], [place] [address]   Urine culture     Status: Abnormal   Collection Time: 01/26/20  8:02 PM   Specimen: In/Out Cath Urine  Result Value Ref Range Status   Specimen Description   Final    IN/OUT CATH URINE Performed at [facility], [address], [place], [place] [address]    Special Requests   Final    NONE Performed at [facility], [address] [place],  [address]    Culture MULTIPLE SPECIES PRESENT, SUGGEST RECOLLECTION (A)  Final   Report Status 01/27/2020 FINAL  Final    Radiology Reports CT HEAD WO CONTRAST  Result Date: 01/26/2020 CLINICAL DATA:  Weakness. EXAM: CT HEAD WITHOUT CONTRAST TECHNIQUE: Contiguous axial images were obtained from the base of the skull through the vertex without intravenous contrast. COMPARISON:  June 20, 2019 FINDINGS: Brain: There is moderate severity cerebral atrophy with widening of the extra-axial spaces and ventricular dilatation. There are areas of decreased attenuation within the white matter tracts of the supratentorial brain, consistent with microvascular disease changes. Vascular: No hyperdense vessel or unexpected calcification. Skull: Normal. Negative for fracture or focal lesion. Sinuses/Orbits: No acute finding. Other: None. IMPRESSION: 1. Generalized cerebral atrophy. 2. No acute intracranial abnormality. Electronically Signed   By: Virgina Norfolk M.D.   On: 01/26/2020 16:27   MR BRAIN W WO CONTRAST  Result Date: 01/27/2020 CLINICAL DATA:  Multiple sclerosis, new event. Left lower extremity weakness. EXAM: MRI HEAD WITHOUT AND WITH CONTRAST TECHNIQUE: Multiplanar, multiecho pulse sequences of the brain and  surrounding structures were obtained without and with intravenous contrast. CONTRAST:  41mL GADAVIST GADOBUTROL 1 MMOL/ML IV SOLN COMPARISON:  Head MRI 11/09/2018 FINDINGS: Brain: No acute infarct, mass, midline shift, or extra-axial fluid collection is identified. A chronic microhemorrhage near the genu of the left internal capsule is unchanged. Moderate cerebral atrophy is unchanged. Extensive T2 and FLAIR hyperintensity in the cerebral white matter bilaterally is unchanged including confluent periventricular signal abnormality and multiple juxtacortical lesions. There is diffuse FLAIR hyperintensity along the callososeptal interface with diffuse callosal thinning. There is no abnormal enhancement or restricted diffusion. A chronic left cerebellar infarct is unchanged. There is moderate cerebral atrophy. Vascular: Major intracranial vascular flow voids are preserved. Skull and upper cervical spine: Unremarkable bone marrow signal. Sinuses/Orbits: Unremarkable orbits. Trace bilateral mastoid fluid. Clear paranasal sinuses. Other: None. IMPRESSION: Unchanged extensive cerebral white matter disease consistent with multiple sclerosis. No evidence of active demyelination or other acute intracranial abnormality. Electronically Signed   By: Logan Bores M.D.   On: 01/27/2020 18:52   DG Chest Port 1 View  Result Date: 01/26/2020 CLINICAL DATA:  Weakness. EXAM: PORTABLE CHEST 1 VIEW COMPARISON:  November 19, 2019 FINDINGS: Mild diffuse chronic appearing increased lung markings are seen. There is no evidence of  acute infiltrate, pleural effusion or pneumothorax. The heart size and mediastinal contours are within normal limits. The visualized skeletal structures are unremarkable. IMPRESSION: No acute or active cardiopulmonary disease. Electronically Signed   By: Virgina Norfolk M.D.   On: 01/26/2020 16:24   DG Abd 2 Views  Result Date: 01/26/2020 CLINICAL DATA:  Left hip pain, weakness, fall EXAM: X-RAY ABDOMEN 2 VIEWS  COMPARISON:  None. FINDINGS: Nonobstructive bowel gas pattern. No free air or organomegaly. No confluent opacity within the lungs. No effusions. Heart is borderline in size. Scoliosis and degenerative changes in the thoracolumbar spine. Posterior fusion changes in the lower lumbar spine. IMPRESSION: No acute findings. Electronically Signed   By: Rolm Baptise M.D.   On: 01/26/2020 16:45   DG Hip Unilat W or Wo Pelvis 2-3 Views Left  Result Date: 01/26/2020 CLINICAL DATA:  Left hip pain after fall EXAM: DG HIP (WITH OR WITHOUT PELVIS) 2-3V LEFT COMPARISON:  None. FINDINGS: No acute bony abnormality. Specifically, no fracture, subluxation, or dislocation. IMPRESSION: No acute bony abnormality. Electronically Signed   By: Rolm Baptise M.D.   On: 01/26/2020 16:44    SIGNED: Deatra James, MD, FACP, FHM. [facility],  Pager (please use [email or web address] to page/text)  If 7PM-7AM, please contact night-coverage [email or web address], Password [facility] 01/31/2020, 12:01 PM

## 2020-01-31 NOTE — Progress Notes (Signed)
Physical Therapy Treatment Patient Details Name: [name] MRN: [id number] DOB: 1950-06-24 Today's Date: 01/31/2020    History of Present Illness [name] is a 63yoF who [facility] c LLE weakness. Pt thought to have presentation 2/2 MS flare up. PMH: HTN, COPD< OSA, MS, neurogenic bladder, chronic foley, depression, migraine, follicular lymphoma.    PT Comments    Patient making progress towards meeting functional goals. Patient was able to stand x 4 bouts with 2 person assistance and rolling walker for UE support. Decreased weight acceptance on LLE initially with standing attempts. Weight shifting assistance provided in standing for ambulation attempts for side steps along edge of bed, however patient unable to maintain standing while weight shifting to right. Patient participated in LE strengthening exercises in bed. Recommend to continue with PT to address remaining functional limitations and to improve independence. SNF is appropriate discharge recommendation at this time.    Follow Up Recommendations  SNF     Equipment Recommendations  None recommended by PT    Recommendations for Other Services       Precautions / Restrictions Precautions Precautions: Fall Restrictions Weight Bearing Restrictions: No    Mobility  Bed Mobility Overal bed mobility: Needs Assistance Bed Mobility: Sit to Supine       Sit to supine: Max assist   General bed mobility comments: assistance for BLE support. verbal cues for technique   Transfers Overall transfer level: Needs assistance Equipment used: Rolling walker (2 wheeled) (bariatric rolling walker ) Transfers: Sit to/from Stand Sit to Stand: +2 physical assistance;Mod assist         General transfer comment: 4 bouts of standing performed from edge of bed with tactile and verbal cues for technique, foot placement, and safety. patient required physical assistance of 2 person for transfer. bed height was also elevated  slightly. patient has decreased weight acceptance on LLE with initial standing attempts.   Ambulation/Gait             General Gait Details: attempted to take a side step at edge of bed. patient does weightshift to right with faciliation for minimal advancement of LLE, however fatigued quickly and unable to progress ambulation efforts further. standing tolerance less than one minute    Stairs             Wheelchair Mobility    Modified Rankin (Stroke Patients Only)       Balance Overall balance assessment: Needs assistance Sitting-balance support: Feet supported;Bilateral upper extremity supported Sitting balance-Leahy Scale: Good     Standing balance support: Bilateral upper extremity supported Standing balance-Leahy Scale: Poor Standing balance comment: patient required physical assistance to maintain standing balance                             Cognition Arousal/Alertness: Awake/alert Behavior During Therapy: WFL for tasks assessed/performed Overall Cognitive Status: Within Functional Limits for tasks assessed                                 General Comments: patient able to follow single step commands consistently       Exercises General Exercises - Lower Extremity Ankle Circles/Pumps: AROM;Both;10 reps;Supine Quad Sets: AROM;Strengthening;Both;10 reps;Supine Heel Slides: AROM;Strengthening;Both;10 reps;Supine Hip ABduction/ADduction: AROM;Strengthening;Both;10 reps;Supine Other Exercises Other Exercises: verbal cues for technique. no shortness of breath or pain is reported with AROM in bed  General Comments        Pertinent Vitals/Pain Pain Assessment: Faces Faces Pain Scale: Hurts whole lot Pain Location: left leg  Pain Descriptors / Indicators: Cramping;Grimacing Pain Intervention(s): Monitored during session;Limited activity within patient's tolerance    Home Living                      Prior Function             PT Goals (current goals can now be found in the care plan section) Acute Rehab PT Goals Patient Stated Goal: to stand and get out of bed  PT Goal Formulation: With patient Time For Goal Achievement: 02/10/20 Potential to Achieve Goals: Fair Progress towards PT goals: Progressing toward goals    Frequency    Min 2X/week      PT Plan Current plan remains appropriate    Co-evaluation PT/OT/SLP Co-Evaluation/Treatment: Yes Reason for Co-Treatment: To address functional/ADL transfers PT goals addressed during session: Mobility/safety with mobility        AM-PAC PT "6 Clicks" Mobility   Outcome Measure  Help needed turning from your back to your side while in a flat bed without using bedrails?: A Lot Help needed moving from lying on your back to sitting on the side of a flat bed without using bedrails?: A Lot Help needed moving to and from a bed to a chair (including a wheelchair)?: Total Help needed standing up from a chair using your arms (e.g., wheelchair or bedside chair)?: Total Help needed to walk in hospital room?: Total Help needed climbing 3-5 steps with a railing? : Total 6 Click Score: 8    End of Session   Activity Tolerance: No increased pain;Patient limited by fatigue Patient left: in bed;with call bell/phone within reach;with bed alarm set Nurse Communication: Mobility status PT Visit Diagnosis: Difficulty in walking, not elsewhere classified (R26.2);Muscle weakness (generalized) (M62.81);Other abnormalities of gait and mobility (R26.89)     Time: 1026-1101 PT Time Calculation (min) (ACUTE ONLY): 35 min  Charges:  $Therapeutic Exercise: 8-22 mins $Therapeutic Activity: 8-22 mins                     Minna Merritts, PT, MPT   Percell Locus 01/31/2020, 12:56 PM

## 2020-02-01 LAB — CBC
HCT: 26.6 % — ABNORMAL LOW (ref 36.0–46.0)
Hemoglobin: 9 g/dL — ABNORMAL LOW (ref 12.0–15.0)
MCH: 28.5 pg (ref 26.0–34.0)
MCHC: 33.8 g/dL (ref 30.0–36.0)
MCV: 84.2 fL (ref 80.0–100.0)
Platelets: 178 10*3/uL (ref 150–400)
RBC: 3.16 MIL/uL — ABNORMAL LOW (ref 3.87–5.11)
RDW: 14.8 % (ref 11.5–15.5)
WBC: 6.6 10*3/uL (ref 4.0–10.5)
nRBC: 0.5 % — ABNORMAL HIGH (ref 0.0–0.2)

## 2020-02-01 LAB — BASIC METABOLIC PANEL
Anion gap: 8 (ref 5–15)
BUN: 41 mg/dL — ABNORMAL HIGH (ref 8–23)
CO2: 26 mmol/L (ref 22–32)
Calcium: 9 mg/dL (ref 8.9–10.3)
Chloride: 103 mmol/L (ref 98–111)
Creatinine, Ser: 1.15 mg/dL — ABNORMAL HIGH (ref 0.44–1.00)
GFR calc Af Amer: 56 mL/min — ABNORMAL LOW (ref >60)
GFR calc non Af Amer: 49 mL/min — ABNORMAL LOW (ref >60)
Glucose, Bld: 136 mg/dL — ABNORMAL HIGH (ref 70–99)
Potassium: 4 mmol/L (ref 3.5–5.1)
Sodium: 137 mmol/L (ref 135–145)

## 2020-02-01 LAB — GLUCOSE, CAPILLARY
Glucose-Capillary: 98 mg/dL (ref 70–99)
Glucose-Capillary: 143 mg/dL — ABNORMAL HIGH (ref 70–99)
Glucose-Capillary: 115 mg/dL — ABNORMAL HIGH (ref 70–99)
Glucose-Capillary: 141 mg/dL — ABNORMAL HIGH (ref 70–99)

## 2020-02-01 MED ORDER — LISINOPRIL 20 MG PO TABS: 10.0000 mg | ORAL_TABLET | Freq: Every day | ORAL | 0 refills | Status: DC

## 2020-02-01 MED ORDER — BACLOFEN 10 MG PO TABS: 10.0000 mg | ORAL_TABLET | Freq: Three times a day (TID) | ORAL | 0 refills | Status: DC

## 2020-02-01 NOTE — TOC Initial Note (Signed)
Transition of Care [facility]) - Initial/Assessment Note    Patient Details  Name: [name] MRN: [id number] Date of Birth: 1950-06-28  Transition of Care [facility]) CM/SW Contact:    Shelbie Ammons, RN Phone Number: 02/01/2020, 8:25 AM  Clinical Narrative:    RNCM met with patient at bedside. Patient reports that she is agreeable to go back to skilled nursing and would prefer [facility] in [place]. She is however agreeable to a bed search of [facility] in [place] as well as [facility]. Patient lives at home with her husband who provides her care and transportation. She denies changes in her PCP or pharmacy and denies any problems affording her medications.                Expected Discharge Plan: Skilled Nursing Facility Barriers to Discharge: No Barriers Identified   Patient Goals and CMS Choice     Choice offered to / list presented to : Patient  Expected Discharge Plan and Services Expected Discharge Plan: [place] Acute Care Choice: [place] Living arrangements for the past 2 months: Single Family Home Expected Discharge Date: 02/01/20                                    Prior Living Arrangements/Services Living arrangements for the past 2 months: Single Family Home Lives with:: Spouse Patient language and need for interpreter reviewed:: Yes Do you feel safe going back to the place where you live?: Yes      Need for Family Participation in Patient Care: Yes (Comment) Care giver support system in place?: Yes (comment)   Criminal Activity/Legal Involvement Pertinent to Current Situation/Hospitalization: No - Comment as needed  Activities of Daily Living Home Assistive Devices/Equipment: [name] (specify type) ADL Screening (condition at time of admission) Patient's cognitive ability adequate to safely complete daily activities?: Yes Is the patient deaf or have difficulty hearing?: No Does the patient have  difficulty seeing, even when wearing glasses/contacts?: Yes Does the patient have difficulty concentrating, remembering, or making decisions?: Yes Patient able to express need for assistance with ADLs?: Yes Does the patient have difficulty dressing or bathing?: Yes Independently performs ADLs?: No Communication: Independent Dressing (OT): Needs assistance Is this a change from baseline?: Pre-admission baseline Grooming: Independent Feeding: Independent Bathing: Independent Toileting: Independent In/Out Bed: Needs assistance Is this a change from baseline?: Pre-admission baseline Walks in Home: Independent with device (comment) (rollator) Does the patient have difficulty walking or climbing stairs?: Yes Weakness of Legs: Left Weakness of Arms/Hands: Left  Permission Sought/Granted                  Emotional Assessment Appearance:: Appears stated age Attitude/Demeanor/Rapport: Engaged Affect (typically observed): Appropriate Orientation: : Oriented to Self, Oriented to Place, Oriented to  Time, Oriented to Situation Alcohol / Substance Use: Not Applicable Psych Involvement: No (comment)  Admission diagnosis:  Morbid obesity ([place]) [E66.01] Multiple sclerosis ([place]) [G35] Fall [W19.XXXA] Left leg weakness [R29.898] Urinary tract infection associated with indwelling urethral catheter, initial encounter ([place]) [K74.259D, N39.0] Patient Active Problem List   Diagnosis Date Noted  . Weakness of left leg 01/26/2020  . Left leg weakness 01/26/2020  . COPD (chronic obstructive pulmonary disease) ([place]) 01/01/2020  . Chronic indwelling Foley catheter 11/19/2019  . Neuropathy   . Hypokalemia   . Hyperlipidemia   . Sepsis secondary to UTI (  [place]) 10/17/2019  . Weakness   . AKI (acute kidney injury) ([place]) 04/20/2019  . Pressure injury of skin 02/03/2019  . Bilateral lower leg cellulitis 02/02/2019  . Ovarian mass, left 01/27/2019  . Sepsis ([place]) 01/05/2019  . UTI (urinary tract  infection) 06/13/2018  . Altered mental status 06/11/2018  . Fall 05/13/2018  . Depression 05/13/2018  . Recurrent cellulitis of lower extremity 06/25/2017  . Medication monitoring encounter 06/05/2017  . Cellulitis of left lower leg 04/25/2017  . Adjustment disorder with mixed disturbance of emotions and conduct 04/23/2017  . Foot pain, bilateral 02/03/2017  . Tinea pedis 02/03/2017  . Left ovarian cyst 12/09/2016  . Abdominal aortic atherosclerosis ([place]) 11/11/2016  . Swelling of lower extremity 10/11/2016  . Obstructive sleep apnea 10/11/2016  . Follicular lymphoma of intra-abdominal lymph nodes ([place]) 08/06/2016  . Multiple falls 06/07/2016  . Inguinal adenopathy 05/29/2016  . Obesity, morbid ([place]) 01/05/2016  . Low HDL (under 40) 12/19/2015  . Cellulitis 12/18/2015  . Skin ulcer ([place]) 11/08/2015  . Peripheral vascular disease of lower extremity with ulceration ([place]) 11/08/2015  . CKD (chronic kidney disease) stage 3, GFR 30-59 ml/min (HCC) 11/08/2015  . Constipation due to pain medication 01/31/2015  . Obstructive sleep apnea of adult 01/13/2015  . Pelvic muscle wasting 01/13/2015  . Incomplete bladder emptying 01/13/2015  . Headache, migraine 10/24/2014  . Neurogenic bladder 10/24/2014  . Current tobacco use 10/24/2014  . Lumbar radiculopathy, chronic 10/02/2013  . COPD with bronchial hyperresponsiveness ([place]) 10/02/2013  . Major depressive disorder, recurrent, in partial remission ([place]) 10/02/2013  . Essential hypertension 10/02/2013  . Multiple sclerosis ([place]) 10/02/2013  . Absence of bladder continence 09/25/2012  . Acontractile bladder 02/12/2012  . Narrowing of intervertebral disc space 11/26/2011  . Kyphoscoliosis and scoliosis 11/26/2011   PCP:  Kirk Ruths, MD Pharmacy:   [place], [place] - [address] [place] [address] Phone: [phone] Fax: [phone]  [facility], [place], [place] - [address] [place] [address] Phone: [phone] Fax: [phone]     Social Determinants of Health (SDOH) Interventions    Readmission Risk Interventions Readmission Risk Prevention Plan 01/03/2020 04/22/2019 02/04/2019  Transportation Screening Complete Complete -  PCP or Specialist Appt within 3-5 Days - - -  HRI or [place] - - Complete  Social Work Consult for [place] Planning/Counseling - - Complete  Palliative Care Screening - - Not Applicable  Medication Review (RN Care Manager) Complete Referral to Pharmacy -  PCP or Specialist appointment within 3-5 days of discharge Complete Complete -  [place] or Home Care Consult Complete Complete -  SW Recovery Care/Counseling Consult Complete Complete -  Palliative Care Screening Not Applicable Not Applicable -  [place] - Complete -  Some recent data might be hidden

## 2020-02-01 NOTE — TOC Initial Note (Signed)
Transition of Care [facility]) - Initial/Assessment Note    Patient Details  Name: [name] MRN: [id number] Date of Birth: 09-Apr-1951  Transition of Care [facility]) CM/SW Contact:    Shelbie Ammons, RN Phone Number: 02/01/2020, 8:27 AM  Clinical Narrative:                   Expected Discharge Plan: [place] Barriers to Discharge: No Barriers Identified   Patient Goals and CMS Choice     Choice offered to / list presented to : Patient  Expected Discharge Plan and Services Expected Discharge Plan: [place] Acute Care Choice: [place] Living arrangements for the past 2 months: Single Family Home Expected Discharge Date: 02/01/20                                    Prior Living Arrangements/Services Living arrangements for the past 2 months: Single Family Home Lives with:: Spouse Patient language and need for interpreter reviewed:: Yes Do you feel safe going back to the place where you live?: Yes      Need for Family Participation in Patient Care: Yes (Comment) Care giver support system in place?: Yes (comment)   Criminal Activity/Legal Involvement Pertinent to Current Situation/Hospitalization: No - Comment as needed  Activities of Daily Living Home Assistive Devices/Equipment: [name] (specify type) ADL Screening (condition at time of admission) Patient's cognitive ability adequate to safely complete daily activities?: Yes Is the patient deaf or have difficulty hearing?: No Does the patient have difficulty seeing, even when wearing glasses/contacts?: Yes Does the patient have difficulty concentrating, remembering, or making decisions?: Yes Patient able to express need for assistance with ADLs?: Yes Does the patient have difficulty dressing or bathing?: Yes Independently performs ADLs?: No Communication: Independent Dressing (OT): Needs assistance Is this a change from baseline?: Pre-admission  baseline Grooming: Independent Feeding: Independent Bathing: Independent Toileting: Independent In/Out Bed: Needs assistance Is this a change from baseline?: Pre-admission baseline Walks in Home: Independent with device (comment) (rollator) Does the patient have difficulty walking or climbing stairs?: Yes Weakness of Legs: Left Weakness of Arms/Hands: Left  Permission Sought/Granted                  Emotional Assessment Appearance:: Appears stated age Attitude/Demeanor/Rapport: Engaged Affect (typically observed): Appropriate Orientation: : Oriented to Self, Oriented to Place, Oriented to  Time, Oriented to Situation Alcohol / Substance Use: Not Applicable Psych Involvement: No (comment)  Admission diagnosis:  Morbid obesity ([place]) [E66.01] Multiple sclerosis ([place]) [G35] Fall [W19.XXXA] Left leg weakness [R29.898] Urinary tract infection associated with indwelling urethral catheter, initial encounter ([name]) [W11.914N, N39.0] Patient Active Problem List   Diagnosis Date Noted  . Weakness of left leg 01/26/2020  . Left leg weakness 01/26/2020  . COPD (chronic obstructive pulmonary disease) ([place]) 01/01/2020  . Chronic indwelling Foley catheter 11/19/2019  . Neuropathy   . Hypokalemia   . Hyperlipidemia   . Sepsis secondary to UTI ([place]) 10/17/2019  . Weakness   . AKI (acute kidney injury) ([place]) 04/20/2019  . Pressure injury of skin 02/03/2019  . Bilateral lower leg cellulitis 02/02/2019  . Ovarian mass, left 01/27/2019  . Sepsis ([place]) 01/05/2019  . UTI (urinary tract infection) 06/13/2018  . Altered mental status 06/11/2018  . Fall 05/13/2018  . Depression 05/13/2018  . Recurrent cellulitis of lower extremity 06/25/2017  . Medication monitoring encounter  06/05/2017  . Cellulitis of left lower leg 04/25/2017  . Adjustment disorder with mixed disturbance of emotions and conduct 04/23/2017  . Foot pain, bilateral 02/03/2017  . Tinea pedis 02/03/2017  . Left ovarian  cyst 12/09/2016  . Abdominal aortic atherosclerosis ([place]) 11/11/2016  . Swelling of lower extremity 10/11/2016  . Obstructive sleep apnea 10/11/2016  . Follicular lymphoma of intra-abdominal lymph nodes ([place]) 08/06/2016  . Multiple falls 06/07/2016  . Inguinal adenopathy 05/29/2016  . Obesity, morbid ([place]) 01/05/2016  . Low HDL (under 40) 12/19/2015  . Cellulitis 12/18/2015  . Skin ulcer ([place]) 11/08/2015  . Peripheral vascular disease of lower extremity with ulceration ([place]) 11/08/2015  . CKD (chronic kidney disease) stage 3, GFR 30-59 ml/min (HCC) 11/08/2015  . Constipation due to pain medication 01/31/2015  . Obstructive sleep apnea of adult 01/13/2015  . Pelvic muscle wasting 01/13/2015  . Incomplete bladder emptying 01/13/2015  . Headache, migraine 10/24/2014  . Neurogenic bladder 10/24/2014  . Current tobacco use 10/24/2014  . Lumbar radiculopathy, chronic 10/02/2013  . COPD with bronchial hyperresponsiveness ([place]) 10/02/2013  . Major depressive disorder, recurrent, in partial remission ([place]) 10/02/2013  . Essential hypertension 10/02/2013  . Multiple sclerosis ([place]) 10/02/2013  . Absence of bladder continence 09/25/2012  . Acontractile bladder 02/12/2012  . Narrowing of intervertebral disc space 11/26/2011  . Kyphoscoliosis and scoliosis 11/26/2011   PCP:  Kirk Ruths, MD Pharmacy:   [place], [place] - [place] [address] Phone: [phone] Fax: [phone]  [facility], [place], [place] - [address] [place] [address] Phone: [phone] Fax: [phone]     Social Determinants of Health (SDOH) Interventions    Readmission Risk Interventions Readmission Risk Prevention Plan 01/03/2020 04/22/2019 02/04/2019  Transportation Screening Complete Complete -  PCP or Specialist Appt within 3-5 Days - - -  HRI or [place] - - Complete   Social Work Consult for [place] Planning/Counseling - - Complete  Palliative Care Screening - - Not Applicable  Medication Review (RN Care Manager) Complete Referral to Pharmacy -  PCP or Specialist appointment within 3-5 days of discharge Complete Complete -  [place] or Home Care Consult Complete Complete -  SW Recovery Care/Counseling Consult Complete Complete -  Palliative Care Screening Not Applicable Not Applicable -  [place] - Complete -  Some recent data might be hidden

## 2020-02-01 NOTE — NC FL2 (Signed)
[place] LEVEL OF CARE SCREENING TOOL     IDENTIFICATION  Patient Name: [name] Birthdate: 1950-05-10 Sex: female Admission Date (Current Location): 01/26/2020  [place] and [place] Number:  Engineering geologist and Address:  [facility], [address], [place], [place] [address]      Provider Number: [id number]  Attending Physician Name and Address:  Deatra James, MD  Relative Name and Phone Number:  [name], [name] (Spouse) [phone]    Current Level of Care: Hospital Recommended Level of Care: [place] Prior Approval Number:    Date Approved/Denied:   PASRR Number: [id number]  Discharge Plan: SNF    Current Diagnoses: Patient Active Problem List   Diagnosis Date Noted  . Weakness of left leg 01/26/2020  . Left leg weakness 01/26/2020  . COPD (chronic obstructive pulmonary disease) ([place]) 01/01/2020  . Chronic indwelling Foley catheter 11/19/2019  . Neuropathy   . Hypokalemia   . Hyperlipidemia   . Sepsis secondary to UTI ([place]) 10/17/2019  . Weakness   . AKI (acute kidney injury) ([place]) 04/20/2019  . Pressure injury of skin 02/03/2019  . Bilateral lower leg cellulitis 02/02/2019  . Ovarian mass, left 01/27/2019  . Sepsis ([place]) 01/05/2019  . UTI (urinary tract infection) 06/13/2018  . Altered mental status 06/11/2018  . Fall 05/13/2018  . Depression 05/13/2018  . Recurrent cellulitis of lower extremity 06/25/2017  . Medication monitoring encounter 06/05/2017  . Cellulitis of left lower leg 04/25/2017  . Adjustment disorder with mixed disturbance of emotions and conduct 04/23/2017  . Foot pain, bilateral 02/03/2017  . Tinea pedis 02/03/2017  . Left ovarian cyst 12/09/2016  . Abdominal aortic atherosclerosis ([place]) 11/11/2016  . Swelling of lower extremity 10/11/2016  . Obstructive sleep apnea 10/11/2016  . Follicular lymphoma of intra-abdominal lymph nodes ([place]) 08/06/2016  . Multiple falls  06/07/2016  . Inguinal adenopathy 05/29/2016  . Obesity, morbid ([place]) 01/05/2016  . Low HDL (under 40) 12/19/2015  . Cellulitis 12/18/2015  . Skin ulcer ([place]) 11/08/2015  . Peripheral vascular disease of lower extremity with ulceration ([place]) 11/08/2015  . CKD (chronic kidney disease) stage 3, GFR 30-59 ml/min (HCC) 11/08/2015  . Constipation due to pain medication 01/31/2015  . Obstructive sleep apnea of adult 01/13/2015  . Pelvic muscle wasting 01/13/2015  . Incomplete bladder emptying 01/13/2015  . Headache, migraine 10/24/2014  . Neurogenic bladder 10/24/2014  . Current tobacco use 10/24/2014  . Lumbar radiculopathy, chronic 10/02/2013  . COPD with bronchial hyperresponsiveness ([place]) 10/02/2013  . Major depressive disorder, recurrent, in partial remission ([place]) 10/02/2013  . Essential hypertension 10/02/2013  . Multiple sclerosis ([place]) 10/02/2013  . Absence of bladder continence 09/25/2012  . Acontractile bladder 02/12/2012  . Narrowing of intervertebral disc space 11/26/2011  . Kyphoscoliosis and scoliosis 11/26/2011    Orientation RESPIRATION BLADDER Height & Weight     Self, Time, Situation, Place  Normal Incontinent Weight: 117.9 kg Height:  5\' 3"  (160 cm)  BEHAVIORAL SYMPTOMS/MOOD NEUROLOGICAL BOWEL NUTRITION STATUS      Continent Diet (Heart Healthy with thin liquids)  AMBULATORY STATUS COMMUNICATION OF NEEDS Skin   Extensive Assist Verbally Normal                       Personal Care Assistance Level of Assistance  Bathing, Feeding, Dressing Bathing Assistance: Limited assistance Feeding assistance: Independent Dressing Assistance: Limited assistance     Functional Limitations Info  SPECIAL CARE FACTORS FREQUENCY  PT (By licensed PT), OT (By licensed OT)                    Contractures Contractures Info: Not present    Additional Factors Info  Code Status, Allergies Code Status Info: Full Allergies Info: No known allergies            Current Medications (02/01/2020):  This is the current hospital active medication list Current Facility-Administered Medications  Medication Dose Route Frequency Provider Last Rate Last Admin  . 0.9 %  sodium chloride infusion  250 mL Intravenous PRN Val Riles, MD 5 mL/hr at 01/28/20 0000 Rate Verify at 01/28/20 0000  . acetaminophen (TYLENOL) tablet 1,000 mg  1,000 mg Oral Q8H PRN Val Riles, MD   1,000 mg at 02/01/20 0811  . atorvastatin (LIPITOR) tablet 10 mg  10 mg Oral QHS Val Riles, MD   10 mg at 01/31/20 2122  . baclofen (LIORESAL) tablet 15 mg  15 mg Oral BID Alexis Goodell, MD   15 mg at 01/31/20 2123  . benzocaine (ORAJEL) 10 % mucosal gel   Mouth/Throat BID PRN Alexis Goodell, MD   Given at 01/30/20 0159  . buPROPion (WELLBUTRIN XL) 24 hr tablet 300 mg  300 mg Oral Daily Val Riles, MD   300 mg at 02/01/20 0802  . Chlorhexidine Gluconate Cloth 2 % PADS 6 each  6 each Topical Daily Sharion Settler, NP   6 each at 02/01/20 0802  . clonazePAM (KLONOPIN) tablet 1 mg  1 mg Oral BID PRN Val Riles, MD      . docusate sodium (COLACE) capsule 100 mg  100 mg Oral BID PRN Val Riles, MD      . DULoxetine (CYMBALTA) DR capsule 60 mg  60 mg Oral Dayle Points, Fabio Neighbors, MD   60 mg at 02/01/20 0757  . enoxaparin (LOVENOX) injection 40 mg  40 mg Subcutaneous Q24H Val Riles, MD   40 mg at 02/01/20 0109  . fluticasone furoate-vilanterol (BREO ELLIPTA) 200-25 MCG/INH 1 puff  1 puff Inhalation Daily Val Riles, MD   1 puff at 02/01/20 0813  . gabapentin (NEURONTIN) tablet 600 mg  600 mg Oral TID Val Riles, MD   600 mg at 01/31/20 2122  . hydrALAZINE (APRESOLINE) injection 10 mg  10 mg Intravenous Q6H PRN Val Riles, MD      . insulin aspart (novoLOG) injection 0-15 Units  0-15 Units Subcutaneous TID WC Val Riles, MD   3 Units at 01/31/20 1710  . insulin aspart (novoLOG) injection 0-5 Units  0-5 Units Subcutaneous QHS Val Riles, MD   2 Units at  01/27/20 0045  . magnesium oxide (MAG-OX) tablet 400 mg  400 mg Oral Daily Val Riles, MD   400 mg at 02/01/20 0813  . mirabegron ER (MYRBETRIQ) tablet 50 mg  50 mg Oral Daily Val Riles, MD   50 mg at 01/31/20 0824  . multivitamin with minerals tablet 1 tablet  1 tablet Oral Daily Val Riles, MD   1 tablet at 02/01/20 [phone]  . multivitamin-lutein (OCUVITE-LUTEIN) capsule 1 capsule  1 capsule Oral Daily Shahmehdi, Seyed A, MD   1 capsule at 01/31/20 0824  . ondansetron (ZOFRAN) tablet 4 mg  4 mg Oral Q6H PRN Val Riles, MD       Or  . ondansetron [facility]) injection 4 mg  4 mg Intravenous Q6H PRN Val Riles, MD      . polyethylene glycol (MIRALAX / Floria Raveling)  packet 17 g  17 g Oral Daily PRN Val Riles, MD      . protein supplement (ENSURE MAX) liquid  11 oz Oral Daily Shahmehdi, Seyed A, MD   11 oz at 02/01/20 0813  . QUEtiapine (SEROQUEL XR) 24 hr tablet 150 mg  150 mg Oral QHS Val Riles, MD   150 mg at 01/31/20 2123  . sodium chloride flush (NS) 0.9 % injection 3 mL  3 mL Intravenous Q12H Val Riles, MD   3 mL at 01/31/20 2124  . sodium chloride flush (NS) 0.9 % injection 3 mL  3 mL Intravenous Q12H Val Riles, MD   3 mL at 02/01/20 0814  . sodium chloride flush (NS) 0.9 % injection 3 mL  3 mL Intravenous PRN Val Riles, MD      . Teriflunomide TABS 14 mg  14 mg Oral Daily Val Riles, MD      . vitamin B-12 (CYANOCOBALAMIN) tablet 1,000 mcg  1,000 mcg Oral Daily Val Riles, MD   1,000 mcg at 02/01/20 0759  . vortioxetine HBr (TRINTELLIX) tablet 5 mg  5 mg Oral Daily Val Riles, MD   5 mg at 01/31/20 0086   Facility-Administered Medications Ordered in Other Encounters  Medication Dose Route Frequency Provider Last Rate Last Admin  . heparin lock flush 100 unit/mL  500 Units Intracatheter Once PRN Sindy Guadeloupe, MD         Discharge Medications: Please see discharge summary for a list of discharge medications.  Relevant Imaging Results:  Relevant  Lab Results:   Additional Information SS# [id number]  Shelbie Ammons, RN

## 2020-02-01 NOTE — Discharge Summary (Signed)
Physician Discharge Summary [facility]    Patient: [name]                   Admit date: 01/26/2020   DOB: 1950/10/04             Discharge date:02/01/2020/10:44 AM [id number]                          PCP: Kirk Ruths, MD  Disposition: SNF  Recommendations for Outpatient Follow-up:   Follow up: in 2 week , PCP, neurologist, urologist Discharge Condition: Stable   Code Status:   Code Status: Full Code  Diet recommendation: Regular healthy diet   Discharge Diagnoses:    Principal Problem:   Weakness of left leg Active Problems:   UTI (urinary tract infection)   COPD with bronchial hyperresponsiveness ([place])   Essential hypertension   Neurogenic bladder   Multiple sclerosis ([place])   Left leg weakness   History of Present Illness/ Hospital Course [name] Summary:     [name] a 69 y.o.F w PMh/oHTN, COPD, OSA, multiple sclerosis, neurogenic bladder with chronic indwelling Foley catheter, depression, migraine, follicular lymphoma treated in the past, presented at [facility] ED due to weakness of left lower extremity.   ED: Vitals were normal, LABs: Chronic anemia hemoglobin stable 11.8 UA cloudy, WBC count elevated,urine culture pending, blood culture pending, Covid negative CT head negative for any acute changes Chest x-ray negative for any acute findings Abdominal x-ray negative for any acute findings Left hip x-ray negative for any acute findings    Left lower extremity weakness could be secondary to MS flareup -Per patient is still having some pain but improved Still complaining of severe bilateral lower leg weakness -Continue IV Solu-Medrol 1 g ... Status post 5-day treatment- per neurology recommendations  -Blood sugar stayed stable, Resumed home medication teriflunomide -PT/OT recommended SNF >>> patient is now agreeable Neurology Dr. Rick Duff follow-up recommendations -Pain medication modified by  neurology-baclofen  UTI,  -Chronic indwelling catheter-follow-up with urologist as an outpatient -Hemodynamically stable Patient has neurogenic bladder.Foley catheter was replaced in the ED UA positive-urine culture>> multiple species, recultured - Antibiotics cefepime and vancomycin in the ED >> has been on IV Rocephin >> will conclude antibiotics on this admission This cultures remain negative   Resumed Myrbetriq    AKI on CKD stage III,  creatinine slightly elevated 1.35 -Improved, status post IV fluid hydration Cr; 1.35 >> 1.57 >>1.12 >> 1.15 >> 1.23 -Monitoring avoiding nephrotoxins  Hypertension, -Remained stable on amlodipine, hydralazine -Holding lisinopril due to A on CKD, elevated creatinine -Stable    COPD, OSA,patient does not wear CPAP at home No exacerbation noted on exam Continue to monitor. Stable off supplemental oxygen  Depressionand migraine, no acute issues -Stable on current meds  Severe debility/generalized weakness acute on chronic pain exacerbated likely by MS -Baclofen ordered modified increased dose per neurology -Continue PT OT which recommended SNF placement -Patient is now agreeable, initiating process today 01/31/2020   Nutritional status:  Nutrition Problem: Increased nutrient needs Etiology: wound healing, chronic illness (stage II sacral and mid coccyx; COPD) Signs/Symptoms: estimated needs Interventions: MVI, Premier Protein  Cultures; Urine Culture  >>> multiple organism nonspecific Recollecting  01/28/2020 >>   Antimicrobials: 9/30/21cefepime and vancomycin in the ED 01/27/20 Started ceftriaxone 1 g IV daily    Consultants: Neuro    ---------------------------------------------------------------------------------------------------------------------------------- Code Status:   Code Status: Full Code Family Communication: Discussed with patient in  detail regarding her comorbidities, severe debility,  SNF--- now agreeable Admission status:     Dispo: The patient is from: Home  Anticipated d/c is to: SNF   Anticipated d/c date is: PT OT recommended SNF,    Nutritional status:  Nutrition Problem: Increased nutrient needs Etiology: wound healing, chronic illness (stage II sacral and mid coccyx; COPD) Signs/Symptoms: estimated needs Interventions: MVI, Premier Protein   Discharge Instructions:   Discharge Instructions    Activity as tolerated - No restrictions   Complete by: As directed    Activity as tolerated - No restrictions   Complete by: As directed    Diet - low sodium heart healthy   Complete by: As directed    Discharge instructions   Complete by: As directed    F/up with neurologist, PCP, urologist   Discharge instructions   Complete by: As directed    Discharge wound care:   Complete by: As directed    Per instructions, wound care team consultation   Discharge wound care:   Complete by: As directed    Per nursing instructions   Increase activity slowly   Complete by: As directed    Increase activity slowly   Complete by: As directed        Medication List    STOP taking these medications   acidophilus Caps capsule   Biotin 1 MG Caps   buPROPion 300 MG 24 hr tablet Commonly known as: WELLBUTRIN XL   clonazePAM 1 MG tablet Commonly known as: KLONOPIN   famotidine 20 MG tablet Commonly known as: PEPCID   gabapentin 600 MG tablet Commonly known as: NEURONTIN   magnesium oxide 400 (241.3 Mg) MG tablet Commonly known as: MAG-OX   Trintellix 5 MG Tabs tablet Generic drug: vortioxetine HBr     TAKE these medications   acetaminophen 500 MG tablet Commonly known as: TYLENOL Take 1,000 mg by mouth every 8 (eight) hours as needed for moderate pain.   amLODipine 5 MG tablet Commonly known as: NORVASC Take 5 mg by mouth daily.   atorvastatin 10 MG tablet Commonly known as: LIPITOR Take 10 mg by mouth at  bedtime.   baclofen 10 MG tablet Commonly known as: LIORESAL Take 1 tablet (10 mg total) by mouth 3 (three) times daily for 5 days.   budesonide-formoterol 160-4.5 MCG/ACT inhaler Commonly known as: SYMBICORT Inhale 2 puffs into the lungs 2 (two) times daily.   cyanocobalamin 1000 MCG tablet Take 1,000 mcg by mouth daily.   docusate sodium 100 MG capsule Commonly known as: COLACE Take 1 capsule (100 mg total) by mouth 2 (two) times daily as needed.   DULoxetine 60 MG capsule Commonly known as: CYMBALTA Take 1 capsule (60 mg total) by mouth every morning.   hydrALAZINE 50 MG tablet Commonly known as: APRESOLINE Take 50 mg by mouth 3 (three) times daily.   lisinopril 20 MG tablet Commonly known as: ZESTRIL Take 0.5 tablets (10 mg total) by mouth daily for 10 days. What changed: how much to take   multivitamin with minerals Tabs tablet Take 1 tablet by mouth daily.   Myrbetriq 50 MG Tb24 tablet Generic drug: mirabegron ER TAKE ONE TABLET BY MOUTH ONCE DAILY What changed:   how much to take  how to take this  when to take this  additional instructions   polyethylene glycol powder 17 GM/SCOOP powder Commonly known as: GLYCOLAX/MIRALAX Take 17 g by mouth daily as needed for mild constipation.   QUEtiapine Fumarate 150 MG  24 hr tablet Commonly known as: SEROQUEL XR Take 150 mg by mouth at bedtime.   Teriflunomide 14 MG Tabs Take 14 mg by mouth daily.       No Known Allergies   Procedures /Studies:   CT HEAD WO CONTRAST  Result Date: 01/26/2020 CLINICAL DATA:  Weakness. EXAM: CT HEAD WITHOUT CONTRAST TECHNIQUE: Contiguous axial images were obtained from the base of the skull through the vertex without intravenous contrast. COMPARISON:  June 20, 2019 FINDINGS: Brain: There is moderate severity cerebral atrophy with widening of the extra-axial spaces and ventricular dilatation. There are areas of decreased attenuation within the white matter tracts of the  supratentorial brain, consistent with microvascular disease changes. Vascular: No hyperdense vessel or unexpected calcification. Skull: Normal. Negative for fracture or focal lesion. Sinuses/Orbits: No acute finding. Other: None. IMPRESSION: 1. Generalized cerebral atrophy. 2. No acute intracranial abnormality. Electronically Signed   By: Virgina Norfolk M.D.   On: 01/26/2020 16:27   MR BRAIN W WO CONTRAST  Result Date: 01/27/2020 CLINICAL DATA:  Multiple sclerosis, new event. Left lower extremity weakness. EXAM: MRI HEAD WITHOUT AND WITH CONTRAST TECHNIQUE: Multiplanar, multiecho pulse sequences of the brain and surrounding structures were obtained without and with intravenous contrast. CONTRAST:  81mL GADAVIST GADOBUTROL 1 MMOL/ML IV SOLN COMPARISON:  Head MRI 11/09/2018 FINDINGS: Brain: No acute infarct, mass, midline shift, or extra-axial fluid collection is identified. A chronic microhemorrhage near the genu of the left internal capsule is unchanged. Moderate cerebral atrophy is unchanged. Extensive T2 and FLAIR hyperintensity in the cerebral white matter bilaterally is unchanged including confluent periventricular signal abnormality and multiple juxtacortical lesions. There is diffuse FLAIR hyperintensity along the callososeptal interface with diffuse callosal thinning. There is no abnormal enhancement or restricted diffusion. A chronic left cerebellar infarct is unchanged. There is moderate cerebral atrophy. Vascular: Major intracranial vascular flow voids are preserved. Skull and upper cervical spine: Unremarkable bone marrow signal. Sinuses/Orbits: Unremarkable orbits. Trace bilateral mastoid fluid. Clear paranasal sinuses. Other: None. IMPRESSION: Unchanged extensive cerebral white matter disease consistent with multiple sclerosis. No evidence of active demyelination or other acute intracranial abnormality. Electronically Signed   By: Logan Bores M.D.   On: 01/27/2020 18:52   DG Chest Port 1  View  Result Date: 01/26/2020 CLINICAL DATA:  Weakness. EXAM: PORTABLE CHEST 1 VIEW COMPARISON:  November 19, 2019 FINDINGS: Mild diffuse chronic appearing increased lung markings are seen. There is no evidence of acute infiltrate, pleural effusion or pneumothorax. The heart size and mediastinal contours are within normal limits. The visualized skeletal structures are unremarkable. IMPRESSION: No acute or active cardiopulmonary disease. Electronically Signed   By: Virgina Norfolk M.D.   On: 01/26/2020 16:24   DG Abd 2 Views  Result Date: 01/26/2020 CLINICAL DATA:  Left hip pain, weakness, fall EXAM: X-RAY ABDOMEN 2 VIEWS COMPARISON:  None. FINDINGS: Nonobstructive bowel gas pattern. No free air or organomegaly. No confluent opacity within the lungs. No effusions. Heart is borderline in size. Scoliosis and degenerative changes in the thoracolumbar spine. Posterior fusion changes in the lower lumbar spine. IMPRESSION: No acute findings. Electronically Signed   By: Rolm Baptise M.D.   On: 01/26/2020 16:45   DG Hip Unilat W or Wo Pelvis 2-3 Views Left  Result Date: 01/26/2020 CLINICAL DATA:  Left hip pain after fall EXAM: DG HIP (WITH OR WITHOUT PELVIS) 2-3V LEFT COMPARISON:  None. FINDINGS: No acute bony abnormality. Specifically, no fracture, subluxation, or dislocation. IMPRESSION: No acute bony abnormality. Electronically Signed  By: Rolm Baptise M.D.   On: 01/26/2020 16:44    Subjective:   Patient was seen and examined 02/01/2020, 10:44 AM Patient stable today. No acute distress.  No issues overnight Stable for discharge.  Discharge Exam:    Vitals:   01/31/20 0752 01/31/20 1539 01/31/20 2357 02/01/20 0735  BP: 138/66 (!) 164/72 (!) 154/57 (!) 157/62  Pulse: 64 66 (!) 57 64  Resp: 16 20 19 16   Temp: 97.8 F (36.6 C) 98.1 F (36.7 C) 98.1 F (36.7 C) 97.9 F (36.6 C)  TempSrc: Oral  Oral   SpO2: 96% 97% 97% 100%  Weight:      Height:        General: Pt lying comfortably in bed &  appears in no obvious distress. Cardiovascular: S1 & S2 heard, RRR, S1/S2 +. No murmurs, rubs, gallops or clicks. No JVD or pedal edema. Respiratory: Clear to auscultation without wheezing, rhonchi or crackles. No increased work of breathing. Abdominal:  Non-distended, non-tender & soft. No organomegaly or masses appreciated. Normal bowel sounds heard. CNS: Alert and oriented. No focal deficits. Musculoskeletal/extremities: Severe generalized weaknesses, unable to ambulate independently, Left lower leg subjective pain, range of motion intact no edema, no cyanosis    The results of significant diagnostics from this hospitalization (including imaging, microbiology, ancillary and laboratory) are listed below for reference.      Microbiology:   Recent Results (from the past 240 hour(s))  Blood Culture (routine x 2)     Status: None   Collection Time: 01/26/20  3:04 PM   Specimen: BLOOD  Result Value Ref Range Status   Specimen Description BLOOD RIGHT ANTECUBITAL  Final   Special Requests   Final    BOTTLES DRAWN AEROBIC AND ANAEROBIC Blood Culture adequate volume   Culture   Final    NO GROWTH 5 DAYS Performed at [facility], [address], [place], [place] [address]    Report Status 01/31/2020 FINAL  Final  Blood Culture (routine x 2)     Status: None   Collection Time: 01/26/20  3:04 PM   Specimen: BLOOD  Result Value Ref Range Status   Specimen Description BLOOD RIGHT ANTECUBITAL  Final   Special Requests   Final    BOTTLES DRAWN AEROBIC AND ANAEROBIC Blood Culture adequate volume   Culture   Final    NO GROWTH 5 DAYS Performed at [facility], [address], [place], [place] [address]    Report Status 01/31/2020 FINAL  Final  Respiratory Panel by RT PCR (Flu A&B, Covid) - Nasopharyngeal Swab     Status: None   Collection Time: 01/26/20  5:36 PM   Specimen: Nasopharyngeal Swab  Result Value Ref Range Status   SARS Coronavirus 2 by RT PCR NEGATIVE  NEGATIVE Final    Comment: (NOTE) SARS-CoV-2 target nucleic acids are NOT DETECTED.  The SARS-CoV-2 RNA is generally detectable in upper respiratoy specimens during the acute phase of infection. The lowest concentration of SARS-CoV-2 viral copies this assay can detect is 131 copies/mL. A negative result does not preclude SARS-Cov-2 infection and should not be used as the sole basis for treatment or other patient management decisions. A negative result may occur with  improper specimen collection/handling, submission of specimen other than nasopharyngeal swab, presence of viral mutation(s) within the areas targeted by this assay, and inadequate number of viral copies (<131 copies/mL). A negative result must be combined with clinical observations, patient history, and epidemiological information. The expected result  is Negative.  Fact Sheet for Patients:  PinkCheek.be  Fact Sheet for Healthcare Providers:  GravelBags.it  This test is no t yet approved or cleared by the [place] FDA and  has been authorized for detection and/or diagnosis of SARS-CoV-2 by FDA under an Emergency Use Authorization (EUA). This EUA will remain  in effect (meaning this test can be used) for the duration of the COVID-19 declaration under Section 564(b)(1) of the Act, 21 U.S.C. section 360bbb-3(b)(1), unless the authorization is terminated or revoked sooner.     Influenza A by PCR NEGATIVE NEGATIVE Final   Influenza B by PCR NEGATIVE NEGATIVE Final    Comment: (NOTE) The Xpert Xpress SARS-CoV-2/FLU/RSV assay is intended as an aid in  the diagnosis of influenza from Nasopharyngeal swab specimens and  should not be used as a sole basis for treatment. Nasal washings and  aspirates are unacceptable for Xpert Xpress SARS-CoV-2/FLU/RSV  testing.  Fact Sheet for Patients: PinkCheek.be  Fact Sheet for Healthcare  Providers: GravelBags.it  This test is not yet approved or cleared by the [place] FDA and  has been authorized for detection and/or diagnosis of SARS-CoV-2 by  FDA under an Emergency Use Authorization (EUA). This EUA will remain  in effect (meaning this test can be used) for the duration of the  Covid-19 declaration under Section 564(b)(1) of the Act, 21  U.S.C. section 360bbb-3(b)(1), unless the authorization is  terminated or revoked. Performed at [facility], [address] [place], [place] [address]   Urine culture     Status: Abnormal   Collection Time: 01/26/20  8:02 PM   Specimen: In/Out Cath Urine  Result Value Ref Range Status   Specimen Description   Final    IN/OUT CATH URINE Performed at [facility], [address] [place], [place] [address]    Special Requests   Final    NONE Performed at [facility], [address] [place], [place] [address]    Culture MULTIPLE SPECIES PRESENT, SUGGEST RECOLLECTION (A)  Final   Report Status 01/27/2020 FINAL  Final     Labs:   CBC: Recent Labs  Lab 01/26/20 1503 01/27/20 0343 01/28/20 0427 01/29/20 0348 01/30/20 0604 01/31/20 0324 02/01/20 0313  WBC 9.3   < > 8.5 6.2 5.1 5.4 6.6  NEUTROABS 7.7  --   --   --   --   --   --   HGB 11.8*   < > 9.5* 8.2* 8.9* 8.2* 9.0*  HCT 36.3   < > 27.9* 24.8* 27.1* 25.5* 26.6*  MCV 87.1   < > 85.8 85.5 86.0 86.4 84.2  PLT 148*   < > 130* 143* 169 163 178   < > = values in this interval not displayed.   Basic Metabolic Panel: Recent Labs  Lab 01/28/20 0427 01/29/20 0348 01/30/20 0604 01/31/20 0324 02/01/20 0313  NA 139 135 139 139 137  K 4.2 3.9 4.2 4.4 4.0  CL 105 103 104 104 103  CO2 23 21* 24 28 26   GLUCOSE 154* 150* 163* 149* 136*  BUN 24* 31* 35* 36* 41*  CREATININE 1.12* 1.15* 1.23* 1.16* 1.15*  CALCIUM 8.8* 9.1 9.4 8.9 9.0   Liver Function Tests: Recent Labs  Lab 01/26/20 1503 01/27/20 0343   AST 15 18  ALT 16 14  ALKPHOS 66 53  BILITOT 0.6 0.7  PROT 7.5 6.7  ALBUMIN 3.9 3.3*   BNP (last 3 results) No results for input(s): BNP  in the last 8760 hours. Cardiac Enzymes: No results for input(s): CKTOTAL, CKMB, CKMBINDEX, TROPONINI in the last 168 hours. CBG: Recent Labs  Lab 01/31/20 1136 01/31/20 1642 01/31/20 2013 01/31/20 2050 02/01/20 0734  GLUCAP 164* 164* 207* 183* 98   Hgb A1c No results for input(s): HGBA1C in the last 72 hours. Lipid Profile No results for input(s): CHOL, HDL, LDLCALC, TRIG, CHOLHDL, LDLDIRECT in the last 72 hours. Thyroid function studies No results for input(s): TSH, T4TOTAL, T3FREE, THYROIDAB in the last 72 hours.  Invalid input(s): FREET3 Anemia work up No results for input(s): VITAMINB12, FOLATE, FERRITIN, TIBC, IRON, RETICCTPCT in the last 72 hours. Urinalysis    Component Value Date/Time   COLORURINE YELLOW (A) 01/26/2020 2002   APPEARANCEUR CLOUDY (A) 01/26/2020 2002   APPEARANCEUR Hazy 08/02/2013 0020   LABSPEC 1.021 01/26/2020 2002   LABSPEC 1.012 08/02/2013 0020   PHURINE 5.0 01/26/2020 2002   GLUCOSEU NEGATIVE 01/26/2020 2002   GLUCOSEU Negative 08/02/2013 0020   HGBUR SMALL (A) 01/26/2020 2002   BILIRUBINUR NEGATIVE 01/26/2020 2002   BILIRUBINUR Negative 08/02/2013 0020   KETONESUR NEGATIVE 01/26/2020 2002   PROTEINUR 100 (A) 01/26/2020 2002   NITRITE NEGATIVE 01/26/2020 2002   LEUKOCYTESUR LARGE (A) 01/26/2020 2002   LEUKOCYTESUR 2+ 08/02/2013 0020   Pressure Injury 10/20/19 Buttocks Left Stage 2 -  Partial thickness loss of dermis presenting as a shallow open injury with a red, pink wound bed without slough. (Active)  10/20/19 2125  Location: Buttocks  Location Orientation: Left  Staging: Stage 2 -  Partial thickness loss of dermis presenting as a shallow open injury with a red, pink wound bed without slough.  Wound Description (Comments):   Present on Admission:      Pressure Injury 10/20/19 Coccyx Mid  Stage 2 -  Partial thickness loss of dermis presenting as a shallow open injury with a red, pink wound bed without slough. (Active)  10/20/19 2125  Location: Coccyx  Location Orientation: Mid  Staging: Stage 2 -  Partial thickness loss of dermis presenting as a shallow open injury with a red, pink wound bed without slough.  Wound Description (Comments):   Present on Admission:        Time coordinating discharge: Over 45 minutes  SIGNED: Deatra James, MD, FACP, [facility]. [facility],  Please use [email or web address] to Page If 7PM-7AM, please contact night-coverage [email or web address] Ota [facility] 02/01/2020, 10:44 AM

## 2020-02-02 DIAGNOSIS — I10 Essential (primary) hypertension: Secondary | ICD-10-CM | POA: Diagnosis not present

## 2020-02-02 LAB — GLUCOSE, CAPILLARY
Glucose-Capillary: 77 mg/dL (ref 70–99)
Glucose-Capillary: 88 mg/dL (ref 70–99)
Glucose-Capillary: 114 mg/dL — ABNORMAL HIGH (ref 70–99)
Glucose-Capillary: 111 mg/dL — ABNORMAL HIGH (ref 70–99)

## 2020-02-02 LAB — CREATININE, SERUM
Creatinine, Ser: 1.18 mg/dL — ABNORMAL HIGH (ref 0.44–1.00)
GFR calc non Af Amer: 47 mL/min — ABNORMAL LOW (ref >60)

## 2020-02-02 NOTE — Progress Notes (Signed)
PROGRESS NOTE    [name]  HWE:[id number] DOB: 31-Oct-1950 DOA: 01/26/2020 PCP: Kirk Ruths, MD    Chief Complaint  Patient presents with  . Weakness    Brief Narrative:  [name] a 69 year old lady, with piror h/o hypertension, COPD, OSA, MS, neurogentic bladder with indwelling foley catheter, depression, migraine, lymphoma  Presents to ED with generalized weakness and weakness of the left lower extremity.    Assessment & Plan:   Principal Problem:   Weakness of left leg Active Problems:   COPD with bronchial hyperresponsiveness (HCC)   Essential hypertension   Neurogenic bladder   Multiple sclerosis (HCC)   UTI (urinary tract infection)   Left leg weakness    Left lower extremity weakness could be secondary to MS flareup Unable to ambulate today, when discharged.  Completed 5 day treatment of high dose IV steroids per neurology recommendations.  Resume Teriflunomide 14 mg daily.  PT/OT eval recommended SNF, but pt family unable to pay co pay. Tried to discharge her home, but she couldn't ambulate.  Neurology Dr. Rick Duff follow-up recommendations Resume Baclofen.   Abnormal UA.  -Chronic indwelling catheter, follow up with Urologist as outpatient.  Patient has neurogenic bladder.Foley catheter was replaced in the ED Urine cultures show multiple bacteria.  Antibiotics Given (last 72 hours)    Date/Time Action Medication Dose Rate   01/31/20 1121 New Bag/Given   cefTRIAXone (ROCEPHIN) 2 g in sodium chloride 0.9 % 100 mL IVPB 2 g 200 mL/hr    she is asymptomatic.  She is afebrile and wbc count wnl.  Resumed Myrbetriq.  Blood cultures, no growth to date.     Acute on stage 3 a CKD:  Improved with IV fluids.  Creatinine at 1.18.    Hypertension, BP parameters are optimal. resume home meds except lisinopril for AKI.    COPD, OSA,patient does not wear CPAP at home No wheezing heard on exam.    Depressionand  migraine, no acute issues Stable on current meds.   Severe debility/generalized weakness acute on chronic pain exacerbated likely by MS PT/OT EVAL recommending SNF, but family unable to pay the co pay, but she is not safe for discharge home and unable to ambulate .      DVT prophylaxis: Lovenox.  Code Status: Full code.  Family Communication: none at bedside. Disposition:   Status is: Inpatient  Remains inpatient appropriate because:Unsafe d/c plan   Dispo: The patient is from: Home              Anticipated d/c is to: SNF              Anticipated d/c date is: 1 day              Patient currently is medically stable to d/c.       Consultants:     Procedures:   Antimicrobials:None   Subjective: No new complaints.   Objective: Vitals:   02/01/20 1514 02/02/20 0012 02/02/20 0739 02/02/20 1446  BP: (!) 160/65 (!) 136/57 (!) 162/70 (!) 152/73  Pulse: 71 62 (!) 58 83  Resp: 17 19 16 17   Temp: 98.1 F (36.7 C) 98.5 F (36.9 C) 98 F (36.7 C) 98.6 F (37 C)  TempSrc:   Oral   SpO2: 99% 95% 100% 98%  Weight:      Height:        Intake/Output Summary (Last 24 hours) at 02/02/2020 1509 Last data filed at 02/02/2020 1456 Gross  per 24 hour  Intake 360 ml  Output 2500 ml  Net -2140 ml   Filed Weights   01/26/20 1452  Weight: 117.9 kg    Examination:  General exam: Appears calm and comfortable  Respiratory system: Clear to auscultation. Respiratory effort normal. Cardiovascular system: S1 & S2 heard, RRR. No JVD, No pedal edema. Gastrointestinal system: Abdomen is nondistended, soft and nontender.  Normal bowel sounds heard. Central nervous system: Alert and oriented. No focal neurological deficits. Extremities: Symmetric 5 x 5 power. Skin: No rashes, lesions or ulcers Psychiatry: Mood & affect appropriate.     Data Reviewed: I have personally reviewed following labs and imaging studies  CBC: Recent Labs  Lab 01/28/20 0427 01/29/20 0348  01/30/20 0604 01/31/20 0324 02/01/20 0313  WBC 8.5 6.2 5.1 5.4 6.6  HGB 9.5* 8.2* 8.9* 8.2* 9.0*  HCT 27.9* 24.8* 27.1* 25.5* 26.6*  MCV 85.8 85.5 86.0 86.4 84.2  PLT 130* 143* 169 163 782    Basic Metabolic Panel: Recent Labs  Lab 01/28/20 0427 01/28/20 0427 01/29/20 0348 01/30/20 0604 01/31/20 0324 02/01/20 0313 02/02/20 0505  NA 139  --  135 139 139 137  --   K 4.2  --  3.9 4.2 4.4 4.0  --   CL 105  --  103 104 104 103  --   CO2 23  --  21* 24 28 26   --   GLUCOSE 154*  --  150* 163* 149* 136*  --   BUN 24*  --  31* 35* 36* 41*  --   CREATININE 1.12*   < > 1.15* 1.23* 1.16* 1.15* 1.18*  CALCIUM 8.8*  --  9.1 9.4 8.9 9.0  --    < > = values in this interval not displayed.    GFR: Estimated Creatinine Clearance: 55.8 mL/min (A) (by C-G formula based on SCr of 1.18 mg/dL (H)).  Liver Function Tests: Recent Labs  Lab 01/27/20 0343  AST 18  ALT 14  ALKPHOS 53  BILITOT 0.7  PROT 6.7  ALBUMIN 3.3*    CBG: Recent Labs  Lab 02/01/20 1126 02/01/20 1631 02/01/20 2105 02/02/20 0739 02/02/20 1136  GLUCAP 143* 115* 141* 77 88     Recent Results (from the past 240 hour(s))  Blood Culture (routine x 2)     Status: None   Collection Time: 01/26/20  3:04 PM   Specimen: BLOOD  Result Value Ref Range Status   Specimen Description BLOOD RIGHT ANTECUBITAL  Final   Special Requests   Final    BOTTLES DRAWN AEROBIC AND ANAEROBIC Blood Culture adequate volume   Culture   Final    NO GROWTH 5 DAYS Performed at [facility], [address] [place], [place] [address]    Report Status 01/31/2020 FINAL  Final  Blood Culture (routine x 2)     Status: None   Collection Time: 01/26/20  3:04 PM   Specimen: BLOOD  Result Value Ref Range Status   Specimen Description BLOOD RIGHT ANTECUBITAL  Final   Special Requests   Final    BOTTLES DRAWN AEROBIC AND ANAEROBIC Blood Culture adequate volume   Culture   Final    NO GROWTH 5 DAYS Performed at [facility], [address] [place], [place] [address]    Report Status 01/31/2020 FINAL  Final  Respiratory Panel by RT PCR (Flu A&B, Covid) - Nasopharyngeal Swab     Status: None   Collection Time: 01/26/20  5:36 PM  Specimen: Nasopharyngeal Swab  Result Value Ref Range Status   SARS Coronavirus 2 by RT PCR NEGATIVE NEGATIVE Final    Comment: (NOTE) SARS-CoV-2 target nucleic acids are NOT DETECTED.  The SARS-CoV-2 RNA is generally detectable in upper respiratoy specimens during the acute phase of infection. The lowest concentration of SARS-CoV-2 viral copies this assay can detect is 131 copies/mL. A negative result does not preclude SARS-Cov-2 infection and should not be used as the sole basis for treatment or other patient management decisions. A negative result may occur with  improper specimen collection/handling, submission of specimen other than nasopharyngeal swab, presence of viral mutation(s) within the areas targeted by this assay, and inadequate number of viral copies (<131 copies/mL). A negative result must be combined with clinical observations, patient history, and epidemiological information. The expected result is Negative.  Fact Sheet for Patients:  PinkCheek.be  Fact Sheet for Healthcare Providers:  GravelBags.it  This test is no t yet approved or cleared by the [place] FDA and  has been authorized for detection and/or diagnosis of SARS-CoV-2 by FDA under an Emergency Use Authorization (EUA). This EUA will remain  in effect (meaning this test can be used) for the duration of the COVID-19 declaration under Section 564(b)(1) of the Act, 21 U.S.C. section 360bbb-3(b)(1), unless the authorization is terminated or revoked sooner.     Influenza A by PCR NEGATIVE NEGATIVE Final   Influenza B by PCR NEGATIVE NEGATIVE Final    Comment: (NOTE) The Xpert Xpress SARS-CoV-2/FLU/RSV assay is intended as an aid in   the diagnosis of influenza from Nasopharyngeal swab specimens and  should not be used as a sole basis for treatment. Nasal washings and  aspirates are unacceptable for Xpert Xpress SARS-CoV-2/FLU/RSV  testing.  Fact Sheet for Patients: PinkCheek.be  Fact Sheet for Healthcare Providers: GravelBags.it  This test is not yet approved or cleared by the [place] FDA and  has been authorized for detection and/or diagnosis of SARS-CoV-2 by  FDA under an Emergency Use Authorization (EUA). This EUA will remain  in effect (meaning this test can be used) for the duration of the  Covid-19 declaration under Section 564(b)(1) of the Act, 21  U.S.C. section 360bbb-3(b)(1), unless the authorization is  terminated or revoked. Performed at [facility], [address], [place], [place] [address]   Urine culture     Status: Abnormal   Collection Time: 01/26/20  8:02 PM   Specimen: In/Out Cath Urine  Result Value Ref Range Status   Specimen Description   Final    IN/OUT CATH URINE Performed at [facility], [address] [place], [place] [address]    Special Requests   Final    NONE Performed at [facility], [address] [place], [place] [address]    Culture MULTIPLE SPECIES PRESENT, SUGGEST RECOLLECTION (A)  Final   Report Status 01/27/2020 FINAL  Final         Radiology Studies: No results found.      Scheduled Meds: . atorvastatin  10 mg Oral QHS  . baclofen  15 mg Oral BID  . buPROPion  300 mg Oral Daily  . Chlorhexidine Gluconate Cloth  6 each Topical Daily  . DULoxetine  60 mg Oral [id number]  . enoxaparin (LOVENOX) injection  40 mg Subcutaneous Q24H  . fluticasone furoate-vilanterol  1 puff Inhalation Daily  . gabapentin  600 mg Oral TID  . insulin aspart  0-15 Units Subcutaneous TID WC  . insulin aspart  0-5 Units Subcutaneous QHS  . magnesium oxide  400 mg Oral Daily  .  mirabegron ER  50 mg Oral Daily  . multivitamin with minerals  1 tablet Oral Daily  . multivitamin-lutein  1 capsule Oral Daily  . Ensure Max Protein  11 oz Oral Daily  . QUEtiapine Fumarate  150 mg Oral QHS  . sodium chloride flush  3 mL Intravenous Q12H  . sodium chloride flush  3 mL Intravenous Q12H  . Teriflunomide  14 mg Oral Daily  . cyanocobalamin  1,000 mcg Oral Daily  . vortioxetine HBr  5 mg Oral Daily   Continuous Infusions: . sodium chloride 5 mL/hr at 01/28/20 0000     LOS: 7 days        Hosie Poisson, MD [facility]   To contact the attending provider between 7A-7P or the covering provider during after hours 7P-7A, please log into the web site [email or web address] and access using universal [place] password for that web site. If you do not have the password, please call the hospital operator.  02/02/2020, 3:09 PM

## 2020-02-02 NOTE — Progress Notes (Signed)
Per MD d/c tele

## 2020-02-02 NOTE — Progress Notes (Signed)
Pt unable to safely ambulate to wheelchair with staff member, MD notified. MD canceled discharge for today.

## 2020-02-02 NOTE — TOC Progression Note (Signed)
Transition of Care [facility]) - Progression Note    Patient Details  Name: [name] MRN: [id number] Date of Birth: [birth year]  Transition of Care [facility]) CM/SW [place], RN Phone Number: 02/02/2020, 2:05 PM  Clinical Narrative:  RNCM met with patient at bedside to discuss that per [facility] [place] at [facility] [place] patient's husband reports that they will not be able to pay the co-pay for her to return to that facility. Discussed with patient that if they are unable to pay the co-pay that she will likely have to return home. Patient verbalizing questions about getting Medicaid so that she can go to a facility. Discussed that she will need to contact DSS and start the process of applying for Medicaid and that this might be an option in the future. Patient reports that she was still getting services through [employer]. Reached out to Ben Bolt with [facility] and they have patient open for RN and PT and will be able to continue.     Expected Discharge Plan: [place] Barriers to Discharge: No Barriers Identified  Expected Discharge Plan and Services Expected Discharge Plan: [place] Choice: [place] arrangements for the past 2 months: Single Family Home Expected Discharge Date: 02/02/20                                     Social Determinants of Health (SDOH) Interventions    Readmission Risk Interventions Readmission Risk Prevention Plan 01/03/2020 04/22/2019 02/04/2019  Transportation Screening Complete Complete -  PCP or Specialist Appt within 3-5 Days - - -  [place] or [place] - - Complete  Social Work Consult for [place] Planning/Counseling - - Complete  Palliative Care Screening - - Not Applicable  Medication Review Press photographer) Complete Referral to Pharmacy -  PCP or Specialist appointment within 3-5 days of discharge Complete Complete -  [place] or Home Care Consult Complete  Complete -  SW Recovery Care/Counseling Consult Complete Complete -  Palliative Care Screening Not Applicable Not Applicable -  [place] - Complete -  Some recent data might be hidden

## 2020-02-03 ENCOUNTER — Inpatient Hospital Stay (HOSPITAL_COMMUNITY)
Admission: RE | Admit: 2020-02-03 | Discharge: 2020-02-03 | Disposition: A | Discharge status: Home / Self Care | Payer: Medicare Other | Source: Home / Self Care | Attending: Student | Admitting: Student

## 2020-02-03 ENCOUNTER — Inpatient Hospital Stay: Payer: Medicare Other

## 2020-02-03 DIAGNOSIS — W06XXXA Fall from bed, initial encounter: Secondary | ICD-10-CM | POA: Diagnosis present

## 2020-02-03 DIAGNOSIS — G4733 Obstructive sleep apnea (adult) (pediatric): Secondary | ICD-10-CM | POA: Diagnosis present

## 2020-02-03 DIAGNOSIS — R5381 Other malaise: Secondary | ICD-10-CM | POA: Diagnosis present

## 2020-02-03 DIAGNOSIS — Z841 Family history of disorders of kidney and ureter: Secondary | ICD-10-CM

## 2020-02-03 DIAGNOSIS — S72342A Displaced spiral fracture of shaft of left femur, initial encounter for closed fracture: Secondary | ICD-10-CM | POA: Diagnosis present

## 2020-02-03 DIAGNOSIS — Z20822 Contact with and (suspected) exposure to covid-19: Secondary | ICD-10-CM | POA: Diagnosis present

## 2020-02-03 DIAGNOSIS — I70209 Unspecified atherosclerosis of native arteries of extremities, unspecified extremity: Secondary | ICD-10-CM | POA: Diagnosis present

## 2020-02-03 DIAGNOSIS — F32A Depression, unspecified: Secondary | ICD-10-CM | POA: Diagnosis present

## 2020-02-03 DIAGNOSIS — C8213 Follicular lymphoma grade II, intra-abdominal lymph nodes: Secondary | ICD-10-CM | POA: Diagnosis present

## 2020-02-03 DIAGNOSIS — Z87891 Personal history of nicotine dependence: Secondary | ICD-10-CM

## 2020-02-03 DIAGNOSIS — Z823 Family history of stroke: Secondary | ICD-10-CM

## 2020-02-03 DIAGNOSIS — G35 Multiple sclerosis: Secondary | ICD-10-CM | POA: Diagnosis present

## 2020-02-03 DIAGNOSIS — J449 Chronic obstructive pulmonary disease, unspecified: Secondary | ICD-10-CM | POA: Diagnosis present

## 2020-02-03 DIAGNOSIS — Z825 Family history of asthma and other chronic lower respiratory diseases: Secondary | ICD-10-CM

## 2020-02-03 DIAGNOSIS — N1831 Chronic kidney disease, stage 3a: Secondary | ICD-10-CM | POA: Diagnosis present

## 2020-02-03 DIAGNOSIS — I129 Hypertensive chronic kidney disease with stage 1 through stage 4 chronic kidney disease, or unspecified chronic kidney disease: Secondary | ICD-10-CM | POA: Diagnosis present

## 2020-02-03 DIAGNOSIS — D631 Anemia in chronic kidney disease: Secondary | ICD-10-CM | POA: Diagnosis present

## 2020-02-03 DIAGNOSIS — S72432A Displaced fracture of medial condyle of left femur, initial encounter for closed fracture: Principal | ICD-10-CM | POA: Diagnosis present

## 2020-02-03 DIAGNOSIS — Z8249 Family history of ischemic heart disease and other diseases of the circulatory system: Secondary | ICD-10-CM

## 2020-02-03 DIAGNOSIS — I10 Essential (primary) hypertension: Secondary | ICD-10-CM | POA: Diagnosis not present

## 2020-02-03 DIAGNOSIS — N179 Acute kidney failure, unspecified: Secondary | ICD-10-CM | POA: Diagnosis present

## 2020-02-03 DIAGNOSIS — J441 Chronic obstructive pulmonary disease with (acute) exacerbation: Secondary | ICD-10-CM

## 2020-02-03 DIAGNOSIS — N319 Neuromuscular dysfunction of bladder, unspecified: Secondary | ICD-10-CM | POA: Diagnosis present

## 2020-02-03 DIAGNOSIS — Y92013 Bedroom of single-family (private) house as the place of occurrence of the external cause: Secondary | ICD-10-CM

## 2020-02-03 DIAGNOSIS — M25562 Pain in left knee: Secondary | ICD-10-CM

## 2020-02-03 DIAGNOSIS — R8290 Unspecified abnormal findings in urine: Secondary | ICD-10-CM | POA: Diagnosis present

## 2020-02-03 DIAGNOSIS — Z8261 Family history of arthritis: Secondary | ICD-10-CM

## 2020-02-03 DIAGNOSIS — Z79899 Other long term (current) drug therapy: Secondary | ICD-10-CM

## 2020-02-03 DIAGNOSIS — Z833 Family history of diabetes mellitus: Secondary | ICD-10-CM

## 2020-02-03 DIAGNOSIS — Z7951 Long term (current) use of inhaled steroids: Secondary | ICD-10-CM

## 2020-02-03 DIAGNOSIS — Z960 Presence of urogenital implants: Secondary | ICD-10-CM | POA: Diagnosis present

## 2020-02-03 DIAGNOSIS — Z6841 Body Mass Index (BMI) 40.0 and over, adult: Secondary | ICD-10-CM

## 2020-02-03 LAB — GLUCOSE, CAPILLARY
Glucose-Capillary: 80 mg/dL (ref 70–99)
Glucose-Capillary: 126 mg/dL — ABNORMAL HIGH (ref 70–99)
Glucose-Capillary: 112 mg/dL — ABNORMAL HIGH (ref 70–99)
Glucose-Capillary: 141 mg/dL — ABNORMAL HIGH (ref 70–99)

## 2020-02-03 MED ORDER — ACETAMINOPHEN 325 MG PO TABS
650.0000 mg | ORAL_TABLET | Freq: Three times a day (TID) | ORAL | Status: DC | PRN
  Administered 2020-02-03: 650 mg via ORAL
  Filled 2020-02-03: qty 2

## 2020-02-03 MED ORDER — TRAMADOL HCL 50 MG PO TABS
100.0000 mg | ORAL_TABLET | Freq: Four times a day (QID) | ORAL | Status: DC | PRN
  Administered 2020-02-03 (×2): 100 mg via ORAL
  Filled 2020-02-03 (×2): qty 2

## 2020-02-03 MED ORDER — MORPHINE SULFATE (PF) 2 MG/ML IV SOLN
1.0000 mg | INTRAVENOUS | Status: DC | PRN
  Administered 2020-02-03: 1 mg via INTRAVENOUS
  Filled 2020-02-03: qty 1

## 2020-02-03 NOTE — Progress Notes (Signed)
Patient a/ox4. Vitals stable. Received Morphine for pain. Patient left with [facility] to be transferred to [facility].

## 2020-02-03 NOTE — Progress Notes (Addendum)
PROGRESS NOTE    [name]  HAL:[id number] DOB: 12-Aug-1950 DOA: 01/26/2020 PCP: Kirk Ruths, MD    Chief Complaint  Patient presents with  . Weakness    Brief Narrative:  [name] a 69 year old lady, with piror h/o hypertension, COPD, OSA, MS, neurogentic bladder with indwelling foley catheter, depression, migraine, lymphoma  Presents to ED with generalized weakness and weakness of the left lower extremity.  She underwent 5 days of high dose IV steroids for possible MS flare up. Pt/ot evaluations recommending SNF,  Yesterday she tried to get up from the bed to ambulate and almost fell. Since then she reports pain int he left knee. X rays of the left knee shows distal femur fracture. Orthopedics consulted , due to her nature of the fracture, she will require trauma orthopedics consultation at [facility]. Dr Doreatha Martin consulted , recommended transfer to [facility] for the surgical repair. She will be made NPO after midnight.    Assessment & Plan:   Principal Problem:   Weakness of left leg Active Problems:   COPD with bronchial hyperresponsiveness (HCC)   Essential hypertension   Neurogenic bladder   Multiple sclerosis (HCC)   UTI (urinary tract infection)   Left leg weakness    Left lower extremity weakness could be secondary to MS flareup Unable to ambulate today, when discharged.  Completed 5 day treatment of high dose IV steroids per neurology recommendations.  Resume Teriflunomide 14 mg daily.  PT/OT eval recommended SNF, but pt family unable to pay co pay. Tried to discharge her home, but she couldn't ambulate and almost fell. She reports pain in the left knee since then , x rays of the knee show mildly displaced comminuted distal femur fracture  Orthopedics consulted , due to the nature of the fracture, she will require trauma orthopedics consultation at [facility]. Dr Doreatha Martin consulted , recommended transfer to [facility] for the surgical repair. She will be made NPO after  midnight for surgery in am.    Abnormal UA.  -Chronic indwelling catheter, follow up with Urologist as outpatient.  Patient has neurogenic bladder.Foley catheter was replaced in the ED Urine cultures show multiple bacteria.  She is asymptomatic.  She is afebrile and wbc count wnl.  Resumed Myrbetriq.  Blood cultures, no growth to date.    Acute on stage 3 a CKD:  Improved with IV fluids.  Creatinine at 1.18.    Hypertension, BP parameters are Optimal.  resume home meds except lisinopril for AKI.    COPD, OSA,patient does not wear CPAP at home No wheezing on exam today.    Depressionand migraine, no acute issues Stable on current meds. No suicidal ideations.   Severe debility/generalized weakness acute on chronic pain exacerbated likely by MS PT/OT EVAL recommending SNF, but family unable to pay the co pay, but she is not safe for discharge home and unable to ambulate .   Mildly displaced, comminuted spiral fracture of the distal left femur extending into the intercondylar notch. Orthopedics consulted , pain control.   DVT prophylaxis: Lovenox.  Code Status: Full code.  Family Communication: Husband at bedside.  Disposition:   Status is: Inpatient  Remains inpatient appropriate because:Ongoing diagnostic testing needed not appropriate for outpatient work up, Unsafe d/c plan and Inpatient level of care appropriate due to severity of illness   Dispo: The patient is from: Home              Anticipated d/c is to: SNF  Anticipated d/c date is: 2 days              Patient currently is medically stable to d/c.       Consultants:   Orthopedics.   Procedures: None.   Antimicrobials:None   Subjective: Left knee pain like soreness. No chest pain or sob. Agreeable for transfer.   Objective: Vitals:   02/02/20 0739 02/02/20 1446 02/03/20 0007 02/03/20 0720  BP: (!) 162/70 (!) 152/73 (!) 127/59 (!) 153/66  Pulse: (!) 58 83 65 61  Resp:  16 17 19 15   Temp: 98 F (36.7 C) 98.6 F (37 C) 98.4 F (36.9 C) 97.8 F (36.6 C)  TempSrc: Oral   Oral  SpO2: 100% 98% 95% 99%  Weight:      Height:        Intake/Output Summary (Last 24 hours) at 02/03/2020 1526 Last data filed at 02/03/2020 1414 Gross per 24 hour  Intake 480 ml  Output 2200 ml  Net -1720 ml   Filed Weights   01/26/20 1452  Weight: 117.9 kg    Examination:  General exam: Alert and comfortable, not in distress Respiratory system: Clear to auscultation, no wheezing or rhonchi Cardiovascular system: S1-S2 heard, regular associated, no JVD, no pedal edema Gastrointestinal system: Abdomen is soft, nontender, nondistended, bowel sounds normal Central nervous system: Alert and oriented, grossly nonfocal Extremities: Left knee pain, no pedal edema.  Skin: No rashes seen Psychiatry: Mood is appropriate    Data Reviewed: I have personally reviewed following labs and imaging studies  CBC: Recent Labs  Lab 01/28/20 0427 01/29/20 0348 01/30/20 0604 01/31/20 0324 02/01/20 0313  WBC 8.5 6.2 5.1 5.4 6.6  HGB 9.5* 8.2* 8.9* 8.2* 9.0*  HCT 27.9* 24.8* 27.1* 25.5* 26.6*  MCV 85.8 85.5 86.0 86.4 84.2  PLT 130* 143* 169 163 671    Basic Metabolic Panel: Recent Labs  Lab 01/28/20 0427 01/28/20 0427 01/29/20 0348 01/30/20 0604 01/31/20 0324 02/01/20 0313 02/02/20 0505  NA 139  --  135 139 139 137  --   K 4.2  --  3.9 4.2 4.4 4.0  --   CL 105  --  103 104 104 103  --   CO2 23  --  21* 24 28 26   --   GLUCOSE 154*  --  150* 163* 149* 136*  --   BUN 24*  --  31* 35* 36* 41*  --   CREATININE 1.12*   < > 1.15* 1.23* 1.16* 1.15* 1.18*  CALCIUM 8.8*  --  9.1 9.4 8.9 9.0  --    < > = values in this interval not displayed.    GFR: Estimated Creatinine Clearance: 55.8 mL/min (A) (by C-G formula based on SCr of 1.18 mg/dL (H)).  Liver Function Tests: No results for input(s): AST, ALT, ALKPHOS, BILITOT, PROT, ALBUMIN in the last 168 hours.  CBG: Recent  Labs  Lab 02/02/20 1136 02/02/20 1619 02/02/20 2127 02/03/20 0721 02/03/20 1114  GLUCAP 88 114* 111* 80 126*     Recent Results (from the past 240 hour(s))  Blood Culture (routine x 2)     Status: None   Collection Time: 01/26/20  3:04 PM   Specimen: BLOOD  Result Value Ref Range Status   Specimen Description BLOOD RIGHT ANTECUBITAL  Final   Special Requests   Final    BOTTLES DRAWN AEROBIC AND ANAEROBIC Blood Culture adequate volume   Culture   Final    NO GROWTH 5 DAYS Performed  at [facility], [address] [place], [place] [address]    Report Status 01/31/2020 FINAL  Final  Blood Culture (routine x 2)     Status: None   Collection Time: 01/26/20  3:04 PM   Specimen: BLOOD  Result Value Ref Range Status   Specimen Description BLOOD RIGHT ANTECUBITAL  Final   Special Requests   Final    BOTTLES DRAWN AEROBIC AND ANAEROBIC Blood Culture adequate volume   Culture   Final    NO GROWTH 5 DAYS Performed at [facility], [address], [place], [place] [address]    Report Status 01/31/2020 FINAL  Final  Respiratory Panel by RT PCR (Flu A&B, Covid) - Nasopharyngeal Swab     Status: None   Collection Time: 01/26/20  5:36 PM   Specimen: Nasopharyngeal Swab  Result Value Ref Range Status   SARS Coronavirus 2 by RT PCR NEGATIVE NEGATIVE Final    Comment: (NOTE) SARS-CoV-2 target nucleic acids are NOT DETECTED.  The SARS-CoV-2 RNA is generally detectable in upper respiratoy specimens during the acute phase of infection. The lowest concentration of SARS-CoV-2 viral copies this assay can detect is 131 copies/mL. A negative result does not preclude SARS-Cov-2 infection and should not be used as the sole basis for treatment or other patient management decisions. A negative result may occur with  improper specimen collection/handling, submission of specimen other than nasopharyngeal swab, presence of viral mutation(s) within the areas targeted by this  assay, and inadequate number of viral copies (<131 copies/mL). A negative result must be combined with clinical observations, patient history, and epidemiological information. The expected result is Negative.  Fact Sheet for Patients:  PinkCheek.be  Fact Sheet for Healthcare Providers:  GravelBags.it  This test is no t yet approved or cleared by the [place] FDA and  has been authorized for detection and/or diagnosis of SARS-CoV-2 by FDA under an Emergency Use Authorization (EUA). This EUA will remain  in effect (meaning this test can be used) for the duration of the COVID-19 declaration under Section 564(b)(1) of the Act, 21 U.S.C. section 360bbb-3(b)(1), unless the authorization is terminated or revoked sooner.     Influenza A by PCR NEGATIVE NEGATIVE Final   Influenza B by PCR NEGATIVE NEGATIVE Final    Comment: (NOTE) The Xpert Xpress SARS-CoV-2/FLU/RSV assay is intended as an aid in  the diagnosis of influenza from Nasopharyngeal swab specimens and  should not be used as a sole basis for treatment. Nasal washings and  aspirates are unacceptable for Xpert Xpress SARS-CoV-2/FLU/RSV  testing.  Fact Sheet for Patients: PinkCheek.be  Fact Sheet for Healthcare Providers: GravelBags.it  This test is not yet approved or cleared by the [place] FDA and  has been authorized for detection and/or diagnosis of SARS-CoV-2 by  FDA under an Emergency Use Authorization (EUA). This EUA will remain  in effect (meaning this test can be used) for the duration of the  Covid-19 declaration under Section 564(b)(1) of the Act, 21  U.S.C. section 360bbb-3(b)(1), unless the authorization is  terminated or revoked. Performed at [facility], [address], [place], [place] [address]   Urine culture     Status: Abnormal   Collection Time: 01/26/20  8:02 PM    Specimen: In/Out Cath Urine  Result Value Ref Range Status   Specimen Description   Final    IN/OUT CATH URINE Performed at [facility], [address] [place],  [address]    Special Requests  Final    NONE Performed at [facility], [address] [place], [place] [address]    Culture MULTIPLE SPECIES PRESENT, SUGGEST RECOLLECTION (A)  Final   Report Status 01/27/2020 FINAL  Final         Radiology Studies: DG Knee 1-2 Views Left  Result Date: 02/03/2020 CLINICAL DATA:  69 year old female with left knee pain. Fall approximately 9 days ago. EXAM: LEFT KNEE - 1-2 VIEW COMPARISON:  Left hip series 01/26/2020. FINDINGS: Spiral fracture of the distal femur metadiaphysis tracking into the intercondylar notch with mild displacement. Small joint effusion. Patella appears to remain intact. Proximal tibia and fibula appear intact. IMPRESSION: Mildly displaced, comminuted spiral fracture of the distal left femur extending into the intercondylar notch. Electronically Signed   By: Genevie Ann M.D.   On: 02/03/2020 11:45        Scheduled Meds: . atorvastatin  10 mg Oral QHS  . baclofen  15 mg Oral BID  . buPROPion  300 mg Oral Daily  . Chlorhexidine Gluconate Cloth  6 each Topical Daily  . DULoxetine  60 mg Oral BH-q7a  . enoxaparin (LOVENOX) injection  40 mg Subcutaneous Q24H  . fluticasone furoate-vilanterol  1 puff Inhalation Daily  . gabapentin  600 mg Oral TID  . insulin aspart  0-15 Units Subcutaneous TID WC  . insulin aspart  0-5 Units Subcutaneous QHS  . magnesium oxide  400 mg Oral Daily  . mirabegron ER  50 mg Oral Daily  . multivitamin with minerals  1 tablet Oral Daily  . multivitamin-lutein  1 capsule Oral Daily  . Ensure Max Protein  11 oz Oral Daily  . QUEtiapine Fumarate  150 mg Oral QHS  . sodium chloride flush  3 mL Intravenous Q12H  . sodium chloride flush  3 mL Intravenous Q12H  . Teriflunomide  14 mg Oral Daily  . cyanocobalamin   1,000 mcg Oral Daily  . vortioxetine HBr  5 mg Oral Daily   Continuous Infusions: . sodium chloride 5 mL/hr at 01/28/20 0000     LOS: 8 days        Hosie Poisson, MD [facility]   To contact the attending provider between 7A-7P or the covering provider during after hours 7P-7A, please log into the web site [email or web address] and access using universal [place] password for that web site. If you do not have the password, please call the hospital operator.  02/03/2020, 3:26 PM

## 2020-02-03 NOTE — Progress Notes (Signed)
Ortho Trauma Note  I have discussed the case and reviewed imaging with Dr. Rudene Christians.  69 year old female with a medial condyle distal femur fracture.  Due to the complexity of her fracture and surgical approach required he requested orthopedic trauma consultation.  I feel that patient would benefit from open reduction internal fixation.  I will tentatively place her on the surgery schedule for tomorrow.  I will request that she gets transferred to [facility] this evening.  Please make her n.p.o. after midnight in preparation for surgery tomorrow.  Shona Needles, MD [facility] [phone] (office) [email or web address]

## 2020-02-03 NOTE — TOC Progression Note (Signed)
Transition of Care [facility] [place] [facility]) - Progression Note    Patient Details  Name: [name] MRN: [id number] Date of Birth: [birth year]  Transition of Care [facility] CM/SW [place], RN Phone Number: 02/03/2020, 11:08 AM  Clinical Narrative:   RNCM met with patient at bedside after being unable to discharge yesterday due to not being able to get up and transfer to wheelchair. Patient reports that she is still planning on going home due to being unable to pay co-pay for facility. She reports that she understands she needs to work on getting her Medicaid in place for long term assistance. Patient is requesting EMS transport home due to inability to transfer into chair at this time.     Expected Discharge Plan: [place] Barriers to Discharge: No Barriers Identified  Expected Discharge Plan and Services Expected Discharge Plan: [place] Choice: [place] arrangements for the past 2 months: Single Family Home Expected Discharge Date: 02/02/20                                     Social Determinants of Health (SDOH) Interventions    Readmission Risk Interventions Readmission Risk Prevention Plan 01/03/2020 04/22/2019 02/04/2019  Transportation Screening Complete Complete -  PCP or Specialist Appt within 3-5 Days - - -  [place] or [place] - - Complete  Social Work Consult for [place] Planning/Counseling - - Complete  Palliative Care Screening - - Not Applicable  Medication Review Press photographer) Complete Referral to Pharmacy -  PCP or Specialist appointment within 3-5 days of discharge Complete Complete -  [place] or Home Care Consult Complete Complete -  SW Recovery Care/Counseling Consult Complete Complete -  Palliative Care Screening Not Applicable Not Applicable -  [place] - Complete -  Some recent data might be hidden

## 2020-02-03 NOTE — Consult Note (Addendum)
Reason for Consult: Left distal femur fracture Referring Physician: [name] is an 69 y.o. female.  HPI: Patient is a 69 year old female with MS left leg weakness chronically and is essentially household ambulator with a walker.  She suffered a fall 6 days ago and has had trouble walking with the left leg since then uncertain as to when this fracture occurred it was at that time of the initial fall apparently she had a stumble yesterday.  She reports inability to bear weight on the left leg and always has some trouble with bearing weight on that side because of her profound weakness.  She has had MS since 1985 and has had episodes of progression by her description..  Past Medical History:  Diagnosis Date  . Abdominal aortic atherosclerosis ([place]) 11/11/2016  . ADHD   . Anxiety   . COPD (chronic obstructive pulmonary disease) ([place])   . Depression    major depressive  . Dyspnea    doe  . Edema    left leg  . Follicular lymphoma ([place])    B Cell  . Follicular lymphoma grade II ([place])   . Hypertension   . Hypotension    idiopathic  . Kyphoscoliosis and scoliosis 11/26/2011  . Morbid obesity ([place]) 01/05/2016  . Multiple sclerosis ([place])   . Multiple sclerosis ([place])    1980's  . Neuromuscular disorder ([place])   . Obstructive and reflux uropathy    foley  . Pain    atypical facial  . Peripheral vascular disease of lower extremity with ulceration ([place]) 11/08/2015  . Skin ulcer ([place]) 11/08/2015  . Weakness    generalized. has MS    Past Surgical History:  Procedure Laterality Date  . BACK SURGERY N/A 2002  . CYST EXCISION     lower back  . INGUINAL LYMPH NODE BIOPSY Left 07/04/2016   Procedure: INGUINAL LYMPH NODE BIOPSY;  Surgeon: Christene Lye, MD;  Location: [facility] ORS;  Service: General;  Laterality: Left;  . PORTACATH PLACEMENT N/A 07/22/2016   Procedure: INSERTION PORT-A-CATH;  Surgeon: Christene Lye, MD;  Location: [facility] ORS;  Service: General;  Laterality:  N/A;  . TONSILLECTOMY AND ADENOIDECTOMY    . TUBAL LIGATION      Family History  Problem Relation Age of Onset  . COPD Mother   . Diabetes Mother   . Heart failure Mother   . Alcohol abuse Father   . Kidney disease Father   . Kidney failure Father   . Arthritis Sister   . CAD Maternal Grandmother   . Stroke Maternal Grandfather   . Arthritis Sister   . Mental illness Sister   . Arthritis Brother     Social History:  reports that she quit smoking about 4 years ago. Her smoking use included cigarettes. She started smoking about 24 years ago. She has a 20.00 pack-year smoking history. She has never used smokeless tobacco. She reports current drug use. Drug: Marijuana. She reports that she does not drink alcohol.  Allergies: No Known Allergies  Medications: I have reviewed the patient's current medications.  Results for orders placed or performed during the hospital encounter of 01/26/20 (from the past 48 hour(s))  Glucose, capillary     Status: Abnormal   Collection Time: 02/01/20  9:05 PM  Result Value Ref Range   Glucose-Capillary 141 (H) 70 - 99 mg/dL    Comment: Glucose reference range applies only to samples taken after fasting for at least 8 hours.  Comment 1 Notify RN   Creatinine, serum     Status: Abnormal   Collection Time: 02/02/20  5:05 AM  Result Value Ref Range   Creatinine, Ser 1.18 (H) 0.44 - 1.00 mg/dL   GFR calc non Af Amer 47 (L) >60 mL/min    Comment: Performed at [facility], [address] [place], [place] [address]  Glucose, capillary     Status: None   Collection Time: 02/02/20  7:39 AM  Result Value Ref Range   Glucose-Capillary 77 70 - 99 mg/dL    Comment: Glucose reference range applies only to samples taken after fasting for at least 8 hours.   Comment 1 Notify RN   Glucose, capillary     Status: None   Collection Time: 02/02/20 11:36 AM  Result Value Ref Range   Glucose-Capillary 88 70 - 99 mg/dL    Comment: Glucose reference  range applies only to samples taken after fasting for at least 8 hours.   Comment 1 Notify RN   Glucose, capillary     Status: Abnormal   Collection Time: 02/02/20  4:19 PM  Result Value Ref Range   Glucose-Capillary 114 (H) 70 - 99 mg/dL    Comment: Glucose reference range applies only to samples taken after fasting for at least 8 hours.   Comment 1 Notify RN   Glucose, capillary     Status: Abnormal   Collection Time: 02/02/20  9:27 PM  Result Value Ref Range   Glucose-Capillary 111 (H) 70 - 99 mg/dL    Comment: Glucose reference range applies only to samples taken after fasting for at least 8 hours.  Glucose, capillary     Status: None   Collection Time: 02/03/20  7:21 AM  Result Value Ref Range   Glucose-Capillary 80 70 - 99 mg/dL    Comment: Glucose reference range applies only to samples taken after fasting for at least 8 hours.  Glucose, capillary     Status: Abnormal   Collection Time: 02/03/20 11:14 AM  Result Value Ref Range   Glucose-Capillary 126 (H) 70 - 99 mg/dL    Comment: Glucose reference range applies only to samples taken after fasting for at least 8 hours.    DG Knee 1-2 Views Left  Result Date: 02/03/2020 CLINICAL DATA:  69 year old female with left knee pain. Fall approximately 9 days ago. EXAM: LEFT KNEE - 1-2 VIEW COMPARISON:  Left hip series 01/26/2020. FINDINGS: Spiral fracture of the distal femur metadiaphysis tracking into the intercondylar notch with mild displacement. Small joint effusion. Patella appears to remain intact. Proximal tibia and fibula appear intact. IMPRESSION: Mildly displaced, comminuted spiral fracture of the distal left femur extending into the intercondylar notch. Electronically Signed   By: Genevie Ann M.D.   On: 02/03/2020 11:45    Review of Systems Blood pressure (!) 151/75, pulse 71, temperature 97.9 F (36.6 C), temperature source Oral, resp. rate 16, height 5\' 3"  (1.6 m), weight 117.9 kg, SpO2 100 %. Physical Exam On exam she has  mild swelling to the knee no erythema she is tender to palpation over the medial femoral condyle.  Distally she is very weak on attempted ankle dorsiflexion and has almost no toe extension secondary to her MS.  She reports this is chronic her calf is atrophied on the left compared to the right and the with regard to the gastrocsoleus.  She has diminished pulses as well this is bilateral.  Skin is intact there are no scars around  the knee with no prior surgery on the left femur.  She had no pain with logrolling of the left leg.  Motion was not attempted but with the fracture present. Radiograph review shows on AP lateral of the knee shows an oblique fracture of the medial femoral condyle extending into the notch with slight displacement there is not appear to be a supracondylar component.  A CT has been ordered stat but is not yet done for further evaluation. Assessment/Plan: Medial femoral condyle fracture in a minimal ambulator with significant medical problems particularly diabetes and MS.  I am trying to get in contact with Dr. Doreatha Martin traumatologist at [facility] in [place] to discuss treatment options as this is unusual fracture and is difficult anatomically to approach in weather with her comorbidities operating intervention is advisable.  Hessie Knows 02/03/2020, 4:38 PM   Addendum: I discussed the case with Dr. Doreatha Martin and he feels that this is something that should be operated on to keep her mobile which I agree with.  This is a very unusual fracture pattern and most orthopedic surgeons are not trained in the medial approach to the distal femur and so he would like to have the patient transferred to [facility] health where he will assume her care.

## 2020-02-03 NOTE — Care Management Important Message (Signed)
Important Message  Patient Details  Name: [name] MRN: [id number] Date of Birth: [birth year]   Medicare Important Message Given:  Yes     Dannette Barbara 02/03/2020, 12:18 PM

## 2020-02-03 NOTE — Plan of Care (Signed)
°  Problem: Coping: °Goal: Level of anxiety will decrease °Outcome: Progressing °  °

## 2020-02-03 NOTE — Progress Notes (Signed)
Nutrition Follow Up Note   DOCUMENTATION CODES:   Morbid obesity  INTERVENTION:   Ensure Max po daily, provides 150 kcal and 30 grams of protein  Ocuvite daily for wound healing (provides zinc, vitamin A, vitamin C, Vitamin E, copper, and selenium)  Continue MVI with minerals daily  Liberalize diet   NUTRITION DIAGNOSIS:   Increased nutrient needs related to wound healing, chronic illness (stage II sacral and mid coccyx; COPD) as evidenced by increased estimated needs.  GOAL:   Patient will meet greater than or equal to 90% of their needs -progressing   MONITOR:   PO intake, Weight trends, Labs, I & O's, Supplement acceptance, Skin  ASSESSMENT:    69 y.o.F w PMh/o HTN, COPD, OSA, multiple sclerosis, neurogenic bladder with chronic indwelling Foley catheter, depression, migraine, follicular lymphoma treated in the past, presented at [facility] ED due to weakness of left lower extremity. Pt noted to have spiral fracture of the distal femur   Met with pt in room today. Pt reports fair appetite and oral intake in hospital; pt reports that she is eating ~50% of meals. Pt is documented to be eating 50-100% of meals. Pt reports that she is drinking one chocolate Ensure Max protein per day. Recommend continue supplements and vitamins to support wound healing. RD will liberalize the heart healthy portion of pt's diet as this is restrictive of protein. No new weight since admit; will request weekly weights.   Medications reviewed and include: lovenox, insulin, Mg oxide, MVI, ocuvite, B12  Labs reviewed: BUN 41(H)- 10/5 creat 1.18(H)- 10/6 Hgb 9.0(L), Hct 26.6(L)  Diet Order:   Diet Order            Diet 2 gram sodium Room service appropriate? Yes; Fluid consistency: Thin  Diet effective now           Diet - low sodium heart healthy                EDUCATION NEEDS:   Education needs have been addressed  Skin:  Skin Assessment: Skin Integrity Issues: Skin Integrity Issues::  Stage II Stage II: mid coccyx;buttocks (10/20/19)  Last BM:  10/7- TYPE 6  Height:   Ht Readings from Last 1 Encounters:  01/26/20 '5\' 3"'  (1.6 m)    Weight:   Wt Readings from Last 1 Encounters:  01/26/20 117.9 kg    BMI:  Body mass index is 46.06 kg/m.  Estimated Nutritional Needs:   Kcal:  2000-2300kcal/day  Protein:  100-115g/day  Fluid:  1.6-1.8L/day  Koleen Distance MS, RD, LDN Please refer to [facility] for RD and/or RD on-call/weekend/after hours pager

## 2020-02-04 ENCOUNTER — Inpatient Hospital Stay (HOSPITAL_COMMUNITY): Payer: Medicare Other | Admitting: Anesthesiology

## 2020-02-04 ENCOUNTER — Encounter (HOSPITAL_COMMUNITY)
Admission: AD | Disposition: A | Discharge status: Skilled Nursing Facility | Payer: Self-pay | Source: Other Acute Inpatient Hospital | Attending: Internal Medicine

## 2020-02-04 ENCOUNTER — Inpatient Hospital Stay (HOSPITAL_COMMUNITY)
Admission: RE | Admit: 2020-02-04 | Discharge: 2020-02-10 | DRG: 481 | Disposition: A | Discharge status: Skilled Nursing Facility | Payer: Medicare Other | Source: Other Acute Inpatient Hospital | Attending: Student | Admitting: Student

## 2020-02-04 ENCOUNTER — Inpatient Hospital Stay (HOSPITAL_COMMUNITY): Payer: Medicare Other

## 2020-02-04 ENCOUNTER — Encounter (HOSPITAL_COMMUNITY): Payer: Self-pay | Admitting: Internal Medicine

## 2020-02-04 DIAGNOSIS — I10 Essential (primary) hypertension: Secondary | ICD-10-CM

## 2020-02-04 DIAGNOSIS — Z833 Family history of diabetes mellitus: Secondary | ICD-10-CM | POA: Diagnosis not present

## 2020-02-04 DIAGNOSIS — G35 Multiple sclerosis: Secondary | ICD-10-CM | POA: Diagnosis present

## 2020-02-04 DIAGNOSIS — S72492G Other fracture of lower end of left femur, subsequent encounter for closed fracture with delayed healing: Secondary | ICD-10-CM | POA: Diagnosis not present

## 2020-02-04 DIAGNOSIS — R32 Unspecified urinary incontinence: Secondary | ICD-10-CM | POA: Diagnosis present

## 2020-02-04 DIAGNOSIS — Z841 Family history of disorders of kidney and ureter: Secondary | ICD-10-CM | POA: Diagnosis not present

## 2020-02-04 DIAGNOSIS — Z960 Presence of urogenital implants: Secondary | ICD-10-CM | POA: Diagnosis present

## 2020-02-04 DIAGNOSIS — Z20822 Contact with and (suspected) exposure to covid-19: Secondary | ICD-10-CM | POA: Diagnosis present

## 2020-02-04 DIAGNOSIS — J449 Chronic obstructive pulmonary disease, unspecified: Secondary | ICD-10-CM | POA: Diagnosis present

## 2020-02-04 DIAGNOSIS — S72342A Displaced spiral fracture of shaft of left femur, initial encounter for closed fracture: Secondary | ICD-10-CM | POA: Diagnosis present

## 2020-02-04 DIAGNOSIS — N1831 Chronic kidney disease, stage 3a: Secondary | ICD-10-CM | POA: Diagnosis present

## 2020-02-04 DIAGNOSIS — Z419 Encounter for procedure for purposes other than remedying health state, unspecified: Secondary | ICD-10-CM

## 2020-02-04 DIAGNOSIS — S72432A Displaced fracture of medial condyle of left femur, initial encounter for closed fracture: Secondary | ICD-10-CM | POA: Diagnosis present

## 2020-02-04 DIAGNOSIS — S7292XA Unspecified fracture of left femur, initial encounter for closed fracture: Secondary | ICD-10-CM | POA: Diagnosis present

## 2020-02-04 DIAGNOSIS — Z8261 Family history of arthritis: Secondary | ICD-10-CM | POA: Diagnosis not present

## 2020-02-04 DIAGNOSIS — R29898 Other symptoms and signs involving the musculoskeletal system: Secondary | ICD-10-CM | POA: Diagnosis present

## 2020-02-04 DIAGNOSIS — N39490 Overflow incontinence: Secondary | ICD-10-CM | POA: Diagnosis not present

## 2020-02-04 DIAGNOSIS — T148XXA Other injury of unspecified body region, initial encounter: Secondary | ICD-10-CM

## 2020-02-04 DIAGNOSIS — I129 Hypertensive chronic kidney disease with stage 1 through stage 4 chronic kidney disease, or unspecified chronic kidney disease: Secondary | ICD-10-CM | POA: Diagnosis present

## 2020-02-04 DIAGNOSIS — D649 Anemia, unspecified: Secondary | ICD-10-CM | POA: Diagnosis not present

## 2020-02-04 DIAGNOSIS — N319 Neuromuscular dysfunction of bladder, unspecified: Secondary | ICD-10-CM | POA: Diagnosis present

## 2020-02-04 DIAGNOSIS — D631 Anemia in chronic kidney disease: Secondary | ICD-10-CM | POA: Diagnosis present

## 2020-02-04 DIAGNOSIS — G4733 Obstructive sleep apnea (adult) (pediatric): Secondary | ICD-10-CM | POA: Diagnosis present

## 2020-02-04 DIAGNOSIS — Z823 Family history of stroke: Secondary | ICD-10-CM | POA: Diagnosis not present

## 2020-02-04 DIAGNOSIS — Y92013 Bedroom of single-family (private) house as the place of occurrence of the external cause: Secondary | ICD-10-CM | POA: Diagnosis not present

## 2020-02-04 DIAGNOSIS — R8290 Unspecified abnormal findings in urine: Secondary | ICD-10-CM | POA: Diagnosis present

## 2020-02-04 DIAGNOSIS — C8213 Follicular lymphoma grade II, intra-abdominal lymph nodes: Secondary | ICD-10-CM | POA: Diagnosis present

## 2020-02-04 DIAGNOSIS — F3341 Major depressive disorder, recurrent, in partial remission: Secondary | ICD-10-CM | POA: Diagnosis not present

## 2020-02-04 DIAGNOSIS — Z6841 Body Mass Index (BMI) 40.0 and over, adult: Secondary | ICD-10-CM | POA: Diagnosis not present

## 2020-02-04 DIAGNOSIS — S72402A Unspecified fracture of lower end of left femur, initial encounter for closed fracture: Secondary | ICD-10-CM | POA: Diagnosis not present

## 2020-02-04 DIAGNOSIS — Z8249 Family history of ischemic heart disease and other diseases of the circulatory system: Secondary | ICD-10-CM | POA: Diagnosis not present

## 2020-02-04 DIAGNOSIS — Z87891 Personal history of nicotine dependence: Secondary | ICD-10-CM | POA: Diagnosis not present

## 2020-02-04 DIAGNOSIS — R5381 Other malaise: Secondary | ICD-10-CM | POA: Diagnosis present

## 2020-02-04 DIAGNOSIS — Z825 Family history of asthma and other chronic lower respiratory diseases: Secondary | ICD-10-CM | POA: Diagnosis not present

## 2020-02-04 DIAGNOSIS — N179 Acute kidney failure, unspecified: Secondary | ICD-10-CM | POA: Diagnosis present

## 2020-02-04 DIAGNOSIS — W06XXXA Fall from bed, initial encounter: Secondary | ICD-10-CM | POA: Diagnosis present

## 2020-02-04 HISTORY — PX: ORIF FEMUR FRACTURE: SHX2119

## 2020-02-04 LAB — BASIC METABOLIC PANEL
Anion gap: 8 (ref 5–15)
BUN: 27 mg/dL — ABNORMAL HIGH (ref 8–23)
CO2: 28 mmol/L (ref 22–32)
Calcium: 8.9 mg/dL (ref 8.9–10.3)
Chloride: 103 mmol/L (ref 98–111)
Creatinine, Ser: 1.13 mg/dL — ABNORMAL HIGH (ref 0.44–1.00)
GFR calc non Af Amer: 50 mL/min — ABNORMAL LOW (ref >60)
Glucose, Bld: 109 mg/dL — ABNORMAL HIGH (ref 70–99)
Potassium: 4.6 mmol/L (ref 3.5–5.1)
Sodium: 139 mmol/L (ref 135–145)

## 2020-02-04 LAB — SURGICAL PCR SCREEN
MRSA, PCR: POSITIVE — AB
Staphylococcus aureus: POSITIVE — AB

## 2020-02-04 LAB — CBC
HCT: 29.2 % — ABNORMAL LOW (ref 36.0–46.0)
Hemoglobin: 9 g/dL — ABNORMAL LOW (ref 12.0–15.0)
MCH: 27.5 pg (ref 26.0–34.0)
MCHC: 30.8 g/dL (ref 30.0–36.0)
MCV: 89.3 fL (ref 80.0–100.0)
Platelets: 208 10*3/uL (ref 150–400)
RBC: 3.27 MIL/uL — ABNORMAL LOW (ref 3.87–5.11)
RDW: 15.4 % (ref 11.5–15.5)
WBC: 7.4 10*3/uL (ref 4.0–10.5)
nRBC: 0 % (ref 0.0–0.2)

## 2020-02-04 SURGERY — OPEN REDUCTION INTERNAL FIXATION (ORIF) DISTAL FEMUR FRACTURE
Anesthesia: General | Site: Leg Lower | Laterality: Left

## 2020-02-04 MED ORDER — FENTANYL CITRATE (PF) 250 MCG/5ML IJ SOLN
INTRAMUSCULAR | Status: DC | PRN
Start: 2020-02-04 — End: 2020-02-04
  Administered 2020-02-04: 100 ug via INTRAVENOUS
  Administered 2020-02-04: 50 ug via INTRAVENOUS

## 2020-02-04 MED ORDER — CHLORHEXIDINE GLUCONATE 4 % EX LIQD: 60.0000 mL | Freq: Once | CUTANEOUS | Status: DC

## 2020-02-04 MED ORDER — BACLOFEN 10 MG PO TABS
10.0000 mg | ORAL_TABLET | Freq: Three times a day (TID) | ORAL | Status: DC
  Administered 2020-02-05 – 2020-02-10 (×16): 10 mg via ORAL
  Filled 2020-02-04 (×16): qty 1

## 2020-02-04 MED ORDER — PROMETHAZINE HCL 25 MG/ML IJ SOLN: 6.2500 mg | INTRAMUSCULAR | Status: DC | PRN

## 2020-02-04 MED ORDER — EPHEDRINE SULFATE 50 MG/ML IJ SOLN
INTRAMUSCULAR | Status: DC | PRN
  Administered 2020-02-04 (×3): 10 mg via INTRAVENOUS
  Administered 2020-02-04: 20 mg via INTRAVENOUS
  Administered 2020-02-04: 10 mg via INTRAVENOUS

## 2020-02-04 MED ORDER — ACETAMINOPHEN 500 MG PO TABS
1000.0000 mg | ORAL_TABLET | Freq: Three times a day (TID) | ORAL | Status: DC
  Administered 2020-02-04 – 2020-02-10 (×16): 1000 mg via ORAL
  Filled 2020-02-04 (×17): qty 2

## 2020-02-04 MED ORDER — CEFAZOLIN SODIUM-DEXTROSE 2-4 GM/100ML-% IV SOLN
2.0000 g | Freq: Three times a day (TID) | INTRAVENOUS | Status: AC
  Administered 2020-02-04 – 2020-02-05 (×3): 2 g via INTRAVENOUS
  Filled 2020-02-04 (×3): qty 100

## 2020-02-04 MED ORDER — DEXAMETHASONE SODIUM PHOSPHATE 10 MG/ML IJ SOLN
INTRAMUSCULAR | Status: AC
  Filled 2020-02-04: qty 1

## 2020-02-04 MED ORDER — ONDANSETRON 4 MG PO TBDP: 4.0000 mg | ORAL_TABLET | Freq: Three times a day (TID) | ORAL | Status: DC | PRN

## 2020-02-04 MED ORDER — PHENYLEPHRINE HCL-NACL 10-0.9 MG/250ML-% IV SOLN
INTRAVENOUS | Status: DC | PRN
  Administered 2020-02-04: 50 ug/min via INTRAVENOUS

## 2020-02-04 MED ORDER — QUETIAPINE FUMARATE ER 50 MG PO TB24
150.0000 mg | ORAL_TABLET | Freq: Once | ORAL | Status: AC
  Administered 2020-02-04: 150 mg via ORAL
  Filled 2020-02-04: qty 3

## 2020-02-04 MED ORDER — DULOXETINE HCL 30 MG PO CPEP
30.0000 mg | ORAL_CAPSULE | Freq: Every day | ORAL | Status: DC
  Administered 2020-02-04 – 2020-02-10 (×7): 30 mg via ORAL
  Filled 2020-02-04 (×7): qty 1

## 2020-02-04 MED ORDER — CHLORHEXIDINE GLUCONATE 0.12 % MT SOLN
OROMUCOSAL | Status: AC
  Administered 2020-02-04: 15 mL via OROMUCOSAL
  Filled 2020-02-04: qty 15

## 2020-02-04 MED ORDER — FENTANYL CITRATE (PF) 100 MCG/2ML IJ SOLN
INTRAMUSCULAR | Status: AC
  Filled 2020-02-04: qty 2

## 2020-02-04 MED ORDER — DEXAMETHASONE SODIUM PHOSPHATE 10 MG/ML IJ SOLN
INTRAMUSCULAR | Status: DC | PRN
  Administered 2020-02-04: 10 mg via INTRAVENOUS

## 2020-02-04 MED ORDER — PROPOFOL 10 MG/ML IV BOLUS
INTRAVENOUS | Status: DC | PRN
  Administered 2020-02-04: 110 mg via INTRAVENOUS

## 2020-02-04 MED ORDER — DIPHENHYDRAMINE HCL 12.5 MG/5ML PO ELIX: 12.5000 mg | ORAL_SOLUTION | Freq: Four times a day (QID) | ORAL | Status: DC | PRN

## 2020-02-04 MED ORDER — HYDRALAZINE HCL 50 MG PO TABS
50.0000 mg | ORAL_TABLET | Freq: Three times a day (TID) | ORAL | Status: DC
  Administered 2020-02-04 – 2020-02-09 (×14): 50 mg via ORAL
  Filled 2020-02-04 (×16): qty 1

## 2020-02-04 MED ORDER — CHLORHEXIDINE GLUCONATE 0.12 % MT SOLN
15.0000 mL | Freq: Once | OROMUCOSAL | Status: AC
  Filled 2020-02-04: qty 15

## 2020-02-04 MED ORDER — ONDANSETRON HCL 4 MG/2ML IJ SOLN
INTRAMUSCULAR | Status: AC
  Filled 2020-02-04: qty 2

## 2020-02-04 MED ORDER — CHLORHEXIDINE GLUCONATE CLOTH 2 % EX PADS
6.0000 | MEDICATED_PAD | Freq: Every day | CUTANEOUS | Status: AC
  Administered 2020-02-06 – 2020-02-08 (×3): 6 via TOPICAL

## 2020-02-04 MED ORDER — CEFAZOLIN SODIUM-DEXTROSE 2-4 GM/100ML-% IV SOLN
2.0000 g | INTRAVENOUS | Status: AC
  Administered 2020-02-04: 2 g via INTRAVENOUS
  Filled 2020-02-04: qty 100

## 2020-02-04 MED ORDER — VANCOMYCIN HCL 1000 MG IV SOLR: INTRAVENOUS | Status: DC | PRN

## 2020-02-04 MED ORDER — VANCOMYCIN HCL 1000 MG IV SOLR
INTRAVENOUS | Status: AC
  Filled 2020-02-04: qty 1000

## 2020-02-04 MED ORDER — METHOCARBAMOL 1000 MG/10ML IJ SOLN
500.0000 mg | Freq: Three times a day (TID) | INTRAVENOUS | Status: DC | PRN
  Filled 2020-02-04: qty 5

## 2020-02-04 MED ORDER — MUPIROCIN 2 % EX OINT
1.0000 "application " | TOPICAL_OINTMENT | Freq: Two times a day (BID) | CUTANEOUS | Status: AC
  Administered 2020-02-04 – 2020-02-09 (×10): 1 via NASAL
  Filled 2020-02-04 (×3): qty 22

## 2020-02-04 MED ORDER — HYDROMORPHONE HCL 1 MG/ML IJ SOLN: 0.5000 mg | INTRAMUSCULAR | Status: DC | PRN

## 2020-02-04 MED ORDER — SODIUM CHLORIDE (PF) 0.9 % IJ SOLN
INTRAMUSCULAR | Status: AC
  Filled 2020-02-04: qty 10

## 2020-02-04 MED ORDER — LACTATED RINGERS IV SOLN: INTRAVENOUS | Status: DC

## 2020-02-04 MED ORDER — ROCURONIUM BROMIDE 10 MG/ML (PF) SYRINGE
PREFILLED_SYRINGE | INTRAVENOUS | Status: AC
  Filled 2020-02-04: qty 10

## 2020-02-04 MED ORDER — AMLODIPINE BESYLATE 5 MG PO TABS
5.0000 mg | ORAL_TABLET | Freq: Every day | ORAL | Status: DC
  Administered 2020-02-04 – 2020-02-05 (×2): 5 mg via ORAL
  Filled 2020-02-04 (×2): qty 1

## 2020-02-04 MED ORDER — METHOCARBAMOL 500 MG PO TABS
500.0000 mg | ORAL_TABLET | Freq: Three times a day (TID) | ORAL | Status: DC | PRN
  Administered 2020-02-04 – 2020-02-08 (×6): 500 mg via ORAL
  Filled 2020-02-04 (×6): qty 1

## 2020-02-04 MED ORDER — SUGAMMADEX SODIUM 200 MG/2ML IV SOLN
INTRAVENOUS | Status: DC | PRN
  Administered 2020-02-04: 10 mg via INTRAVENOUS
  Administered 2020-02-04: 180 mg via INTRAVENOUS

## 2020-02-04 MED ORDER — OXYCODONE HCL 5 MG PO TABS
10.0000 mg | ORAL_TABLET | ORAL | Status: DC | PRN
  Administered 2020-02-04 (×3): 10 mg via ORAL
  Filled 2020-02-04 (×3): qty 2

## 2020-02-04 MED ORDER — EPHEDRINE 5 MG/ML INJ
INTRAVENOUS | Status: AC
  Filled 2020-02-04: qty 10

## 2020-02-04 MED ORDER — ONDANSETRON HCL 4 MG/2ML IJ SOLN
INTRAMUSCULAR | Status: DC | PRN
  Administered 2020-02-04: 4 mg via INTRAVENOUS

## 2020-02-04 MED ORDER — TERIFLUNOMIDE 14 MG PO TABS: 14.0000 mg | ORAL_TABLET | Freq: Every day | ORAL | Status: DC

## 2020-02-04 MED ORDER — PROPOFOL 10 MG/ML IV BOLUS
INTRAVENOUS | Status: AC
  Filled 2020-02-04: qty 20

## 2020-02-04 MED ORDER — FENTANYL CITRATE (PF) 250 MCG/5ML IJ SOLN
INTRAMUSCULAR | Status: AC
  Filled 2020-02-04: qty 5

## 2020-02-04 MED ORDER — LIDOCAINE 2% (20 MG/ML) 5 ML SYRINGE
INTRAMUSCULAR | Status: DC | PRN
  Administered 2020-02-04: 100 mg via INTRAVENOUS

## 2020-02-04 MED ORDER — MIDAZOLAM HCL 2 MG/2ML IJ SOLN
INTRAMUSCULAR | Status: AC
  Filled 2020-02-04: qty 2

## 2020-02-04 MED ORDER — FENTANYL CITRATE (PF) 100 MCG/2ML IJ SOLN
25.0000 ug | INTRAMUSCULAR | Status: DC | PRN
  Administered 2020-02-04 (×2): 50 ug via INTRAVENOUS

## 2020-02-04 MED ORDER — OXYCODONE HCL 5 MG PO TABS: 5.0000 mg | ORAL_TABLET | ORAL | Status: DC | PRN

## 2020-02-04 MED ORDER — POVIDONE-IODINE 10 % EX SWAB: 2.0000 "application " | Freq: Once | CUTANEOUS | Status: DC

## 2020-02-04 MED ORDER — ATORVASTATIN CALCIUM 10 MG PO TABS
10.0000 mg | ORAL_TABLET | Freq: Every day | ORAL | Status: DC
  Administered 2020-02-04 – 2020-02-09 (×6): 10 mg via ORAL
  Filled 2020-02-04 (×6): qty 1

## 2020-02-04 MED ORDER — CHLORHEXIDINE GLUCONATE CLOTH 2 % EX PADS
6.0000 | MEDICATED_PAD | Freq: Every day | CUTANEOUS | Status: DC
  Administered 2020-02-04: 6 via TOPICAL

## 2020-02-04 MED ORDER — 0.9 % SODIUM CHLORIDE (POUR BTL) OPTIME
TOPICAL | Status: DC | PRN
  Administered 2020-02-04: 1000 mL

## 2020-02-04 MED ORDER — PHENYLEPHRINE 40 MCG/ML (10ML) SYRINGE FOR IV PUSH (FOR BLOOD PRESSURE SUPPORT)
PREFILLED_SYRINGE | INTRAVENOUS | Status: AC
  Filled 2020-02-04: qty 10

## 2020-02-04 MED ORDER — ROCURONIUM BROMIDE 10 MG/ML (PF) SYRINGE
PREFILLED_SYRINGE | INTRAVENOUS | Status: DC | PRN
  Administered 2020-02-04: 70 mg via INTRAVENOUS
  Administered 2020-02-04: 20 mg via INTRAVENOUS

## 2020-02-04 MED ORDER — MOMETASONE FURO-FORMOTEROL FUM 200-5 MCG/ACT IN AERO
2.0000 | INHALATION_SPRAY | Freq: Two times a day (BID) | RESPIRATORY_TRACT | Status: DC
  Administered 2020-02-04 – 2020-02-09 (×10): 2 via RESPIRATORY_TRACT
  Filled 2020-02-04 (×2): qty 8.8

## 2020-02-04 SURGICAL SUPPLY — 85 items
ADH SKN CLS LQ APL DERMABOND (GAUZE/BANDAGES/DRESSINGS) ×1
APL PRP STRL LF DISP 70% ISPRP (MISCELLANEOUS) ×1
BIT DRILL 2.5MM SMALL QC EVOS (BIT) IMPLANT
BIT DRILL QC 2.5MM SHRT EVO SM (DRILL) IMPLANT
BLADE CLIPPER SURG (BLADE) IMPLANT
BNDG CMPR MED 10X6 ELC LF (GAUZE/BANDAGES/DRESSINGS) ×1
BNDG COHESIVE 6X5 TAN STRL LF (GAUZE/BANDAGES/DRESSINGS) ×2 IMPLANT
BNDG ELASTIC 6X10 VLCR STRL LF (GAUZE/BANDAGES/DRESSINGS) ×2 IMPLANT
BRUSH SCRUB EZ PLAIN DRY (MISCELLANEOUS) ×4 IMPLANT
CANISTER SUCT 3000ML PPV (MISCELLANEOUS) ×2 IMPLANT
CHLORAPREP W/TINT 26 (MISCELLANEOUS) ×2 IMPLANT
COVER SURGICAL LIGHT HANDLE (MISCELLANEOUS) ×2 IMPLANT
COVER WAND RF STERILE (DRAPES) ×2 IMPLANT
DERMABOND ADHESIVE PROPEN (GAUZE/BANDAGES/DRESSINGS) ×1
DERMABOND ADVANCED .7 DNX6 (GAUZE/BANDAGES/DRESSINGS) IMPLANT
DRAPE C-ARM 42X72 X-RAY (DRAPES) ×2 IMPLANT
DRAPE C-ARMOR (DRAPES) ×2 IMPLANT
DRAPE HALF SHEET 40X57 (DRAPES) ×4 IMPLANT
DRAPE ORTHO SPLIT 77X108 STRL (DRAPES) ×4
DRAPE SURG 17X23 STRL (DRAPES) ×2 IMPLANT
DRAPE SURG ORHT 6 SPLT 77X108 (DRAPES) ×2 IMPLANT
DRAPE U-SHAPE 47X51 STRL (DRAPES) ×2 IMPLANT
DRILL 2.5MM SMALL QC EVOS (BIT) ×2
DRILL QC 2.5MM SHORT EVOS SM (DRILL) ×2
DRSG ADAPTIC 3X8 NADH LF (GAUZE/BANDAGES/DRESSINGS) IMPLANT
DRSG MEPILEX BORDER 4X12 (GAUZE/BANDAGES/DRESSINGS) IMPLANT
DRSG MEPILEX BORDER 4X4 (GAUZE/BANDAGES/DRESSINGS) ×1 IMPLANT
DRSG MEPILEX BORDER 4X8 (GAUZE/BANDAGES/DRESSINGS) ×1 IMPLANT
DRSG PAD ABDOMINAL 8X10 ST (GAUZE/BANDAGES/DRESSINGS) ×6 IMPLANT
ELECT REM PT RETURN 9FT ADLT (ELECTROSURGICAL) ×2
ELECTRODE REM PT RTRN 9FT ADLT (ELECTROSURGICAL) ×1 IMPLANT
GAUZE SPONGE 4X4 12PLY STRL (GAUZE/BANDAGES/DRESSINGS) ×2 IMPLANT
GLOVE BIO SURGEON STRL SZ 6.5 (GLOVE) ×6 IMPLANT
GLOVE BIO SURGEON STRL SZ7.5 (GLOVE) ×8 IMPLANT
GLOVE BIOGEL PI IND STRL 6.5 (GLOVE) ×1 IMPLANT
GLOVE BIOGEL PI IND STRL 7.5 (GLOVE) ×1 IMPLANT
GLOVE BIOGEL PI INDICATOR 6.5 (GLOVE) ×1
GLOVE BIOGEL PI INDICATOR 7.5 (GLOVE) ×1
GOWN STRL REUS W/ TWL LRG LVL3 (GOWN DISPOSABLE) ×3 IMPLANT
GOWN STRL REUS W/TWL LRG LVL3 (GOWN DISPOSABLE) ×6
K-WIRE 2.0 (WIRE) ×3
K-WIRE TROCAR PT 2.0 150MM (WIRE) ×3
KIT BASIN OR (CUSTOM PROCEDURE TRAY) ×2 IMPLANT
KIT TURNOVER KIT B (KITS) ×2 IMPLANT
KWIRE TROCAR PT 2.0 150 (WIRE) IMPLANT
KWIRE TROCAR PT 2.0 150MM (WIRE) ×3 IMPLANT
NS IRRIG 1000ML POUR BTL (IV SOLUTION) ×2 IMPLANT
PACK TOTAL JOINT (CUSTOM PROCEDURE TRAY) ×2 IMPLANT
PAD ARMBOARD 7.5X6 YLW CONV (MISCELLANEOUS) ×4 IMPLANT
PAD CAST 4YDX4 CTTN HI CHSV (CAST SUPPLIES) ×1 IMPLANT
PADDING CAST ABS 4INX4YD NS (CAST SUPPLIES) ×1
PADDING CAST ABS 6INX4YD NS (CAST SUPPLIES) ×1
PADDING CAST ABS COTTON 4X4 ST (CAST SUPPLIES) IMPLANT
PADDING CAST ABS COTTON 6X4 NS (CAST SUPPLIES) IMPLANT
PADDING CAST COTTON 4X4 STRL (CAST SUPPLIES) ×2
PADDING CAST COTTON 6X4 STRL (CAST SUPPLIES) ×2 IMPLANT
PLATE TIB EVOS 13H R 3.5X170 (Plate) ×1 IMPLANT
SCREW CORT 3.5X44MM ST EVOS (Screw) ×1 IMPLANT
SCREW CORT ST EVOS 3.5X55 (Screw) ×1 IMPLANT
SCREW CORT ST EVOS 3.5X65 (Screw) ×1 IMPLANT
SCREW CORT ST EVOS 3.5X70 (Screw) ×1 IMPLANT
SCREW CTX 3.5X38MM EVOS (Screw) ×1 IMPLANT
SCREW CTX 3.5X50MM EVOS (Screw) ×1 IMPLANT
SCREW LOCK CORTICAL 3.5X42 (Screw) ×1 IMPLANT
SCREW LOCK EVOS ST 3.5X85 (Screw) ×1 IMPLANT
SCREW LOCK ST EVOS 3.5 X 80 (Screw) ×1 IMPLANT
SCREW LOCKING 3.5X75 (Screw) ×2 IMPLANT
SPONGE LAP 18X18 RF (DISPOSABLE) ×4 IMPLANT
STAPLER VISISTAT 35W (STAPLE) ×2 IMPLANT
SUCTION FRAZIER HANDLE 10FR (MISCELLANEOUS) ×2
SUCTION TUBE FRAZIER 10FR DISP (MISCELLANEOUS) ×1 IMPLANT
SUT ETHILON 3 0 PS 1 (SUTURE) ×4 IMPLANT
SUT MNCRL+ AB 3-0 CT1 36 (SUTURE) IMPLANT
SUT MONOCRYL AB 3-0 CT1 36IN (SUTURE) ×4
SUT VIC AB 0 CT1 27 (SUTURE) ×4
SUT VIC AB 0 CT1 27XBRD ANBCTR (SUTURE) IMPLANT
SUT VIC AB 1 CT1 18XCR BRD 8 (SUTURE) IMPLANT
SUT VIC AB 1 CT1 27 (SUTURE)
SUT VIC AB 1 CT1 27XBRD ANBCTR (SUTURE) IMPLANT
SUT VIC AB 1 CT1 8-18 (SUTURE) ×2
SUT VIC AB 2-0 CT1 27 (SUTURE) ×4
SUT VIC AB 2-0 CT1 TAPERPNT 27 (SUTURE) ×2 IMPLANT
TOWEL GREEN STERILE (TOWEL DISPOSABLE) ×4 IMPLANT
TRAY FOLEY MTR SLVR 16FR STAT (SET/KITS/TRAYS/PACK) IMPLANT
WATER STERILE IRR 1000ML POUR (IV SOLUTION) ×2 IMPLANT

## 2020-02-04 NOTE — Progress Notes (Signed)
Received pt from PACU s/p ORIF distal femur fracture left lower leg. Compression wrap CDI. Pt alert/oriented in no apparent distress. No complaints.

## 2020-02-04 NOTE — Interval H&P Note (Signed)
History and Physical Interval Note:  02/04/2020 5:14 PM  [name]  has presented today for surgery, with the diagnosis of Left medial condyle fracture.  The various methods of treatment have been discussed with the patient and family. After consideration of risks, benefits and other options for treatment, the patient has consented to  Procedure(s): OPEN REDUCTION INTERNAL FIXATION (ORIF) DISTAL FEMUR FRACTURE (Left) as a surgical intervention.  The patient's history has been reviewed, patient examined, no change in status, stable for surgery.  I have reviewed the patient's chart and labs.  Questions were answered to the patient's satisfaction.     Lennette Bihari P Johnae Friley

## 2020-02-04 NOTE — Op Note (Signed)
Orthopaedic Surgery Operative Note (CSN: [id number] ) Date of Surgery: 02/04/2020  Admit Date: 02/04/2020   Diagnoses: Pre-Op Diagnoses: Left medial femoral condyle fracture Nondisplaced femoral shaft fracture  Post-Op Diagnosis: Same  Procedures: 1. CPT 27514-Open reduction internal fixation left medial condyle fracture 2. CPT 61607-PXTG reduction internal fixation of left femoral shaft fracture  Surgeons : Primary: Maizee Reinhold, Thomasene Lot, MD  Assistant: Izola Price, RNFA  Location: OR 5   Anesthesia:General  Antibiotics: Ancef 2g preop  Tourniquet time:None used    Estimated Blood GYIR:485 mL  Complications:None   Specimens:None   Implants: Implant Name Type Inv. Item Serial No. Manufacturer Lot No. LRB No. Used Action  SCREW CORT ST EVOS 3.5X70 - [id number] Screw SCREW CORT ST EVOS 3.5X70  [employer]  Left 1 Implanted  SCREW CORT ST EVOS 3.5X55 - [id number] Screw SCREW CORT ST EVOS 3.5X55  [employer]  Left 1 Implanted  SCREW CORT ST EVOS 3.5X65 - [id number] Screw SCREW CORT ST EVOS 3.5X65  [employer]  Left 1 Implanted  PLATE TIB EVOS 81O R 3.5X170 - [id number] Plate PLATE TIB EVOS 27P R 3.5X170  [employer]  Left 1 Implanted  SCREW CORT 3.5X44MM ST EVOS - [id number] Screw SCREW CORT 3.5X44MM ST EVOS  [employer]  Left 1 Implanted  SCREW LOCK CORTICAL 3.5X42 - [id number] Screw SCREW LOCK CORTICAL 3.5X42  [employer]  Left 1 Implanted  SCREW CTX 3.5X38MM EVOS - [id number] Screw SCREW CTX 3.5X38MM EVOS  [employer]  Left 1 Implanted  SCREW LOCKING 3.5X75 - [id number] Screw SCREW LOCKING 3.5X75  [employer]  Left 2 Implanted  SCREW LOCK ST EVOS 3.5 X 80 - [id number] Screw SCREW LOCK ST EVOS 3.5 X 80  [employer]  Left 1 Implanted  SCREW LOCK EVOS ST 3.5X85 - [id number] Screw SCREW LOCK EVOS ST 3.5X85  [employer]  Left 1  Implanted  SCREW CTX 3.5X50MM EVOS - [id number] Screw SCREW CTX 3.5X50MM EVOS  [employer]  Left 1 Implanted     Indications for Surgery: 69 year old female who sustained a ground level and had a left intra-articular medial condyle fracture that had femoral shaft extension.  Due to the unstable nature of her injury I recommended proceeding with open reduction internal fixation.  Risks and benefits were discussed with the patient.  Risks included but not limited to bleeding, infection, malunion, nonunion, hardware failure, hardware irritation, nerve and blood vessel injury, knee stiffness, knee arthritis, DVT, even the possibility anesthetic complications.  The patient agreed to proceed with surgery and consent was obtained.  Operative Findings: Open reduction internal fixation of left medial condyle fracture and femoral shaft fracture using [employer] 13 hole medial proximal tibial plate placed along the medial cortex as a buttress.  Procedure: The patient was identified in the preoperative holding area. Consent was confirmed with the patient and their family and all questions were answered. The operative extremity was marked after confirmation with the patient. she was then brought back to the operating room by our anesthesia colleagues.  She was placed under general anesthetic and carefully transferred over to a radiolucent flat top table.  A bump was placed under her operative hip.  The left lower extremity was prepped and draped in usual sterile fashion.  A timeout was performed to verify the patient, the procedure, and the extremity.  Preoperative antibiotics were  dosed.  Fluoroscopic imaging was obtained to show the unstable nature of her injury.  A medial parapatellar incision was made and carried down through skin and subcutaneous tissue.  I incised through the capsule just medial to the patella and the patellar tendon.  I incised through a portion of the vastus medialis but  I tried to keep the attachment and the VMO intact.  I exposed the medial condyle and was able to mobilize the patella to visualize the intra-articular split.  I used a Cobb elevator to enter the fracture and mobilized the fracture to be able to anatomically reduce the articular surface.  A reduction tenaculum was used to hold the reduction and I held it provisionally with a K wire.  I then used the Cobb elevator to help manipulate the proximal extension of the fracture to reduce it to the femoral shaft.  A another reduction tenaculum was used to hold this reduction.  I then placed a 3.5 millimeter screws from medial to lateral to provisionally hold the reduction of the joint and shaft component.  There was a nondisplaced extension into the femoral shaft that I felt required fixation.  Due to the need for an extended plate I chose to use a Smith [employer] Nephew medial proximal tibial locking plate to provide the fixation.  I placed this along the medial condyle and slid it submuscularly along the medial cortex of the femur.  I made an incision over the anterior medial aspect of the proximal thigh and used a K wire to hold the proximal portion of the plate in place.  I then placed a K wire distally to hold the distal portion of the plate.  I then placed a percutaneous screw 3.5 millimeter screws into the femoral shaft to hold the proximal fixation of the plate.  I then returned to the distal portion and placed a 3.5 mm locking screws.  Final fluoroscopic imaging was obtained.  The incision was copiously irrigated.  A layer closure of 0 Vicryl, 2-0 Vicryl 3-0 Monocryl and Dermabond was used to close the skin.  Sterile dressing was placed in a soft compressive dressing was applied to the lower extremity.  The patient was then awoken from anesthesia and taken to the PACU in stable condition.  Post Op Plan/Instructions: The patient will be nonweightbearing to left lower extremity.  She will receive postoperative Ancef.   She will receive Lovenox for DVT prophylaxis.  We will have her mobilize with physical and Occupational Therapy.  I was present and performed the entire surgery.  Katha Hamming, MD [facility]

## 2020-02-04 NOTE — H&P (Signed)
History and Physical   MAREA REASNER PIR:[id number] DOB: 1950/10/31 DOA: 02/04/2020  PCP: Kirk Ruths, MD  Patient coming from: transfer from [name]  I have personally briefly reviewed patient's old medical records in [facility] EMR  Chief Concern: left lower extremity weakness  HPI: [name] is a 69 y.o. female with medical history hypertension, COPD, obstructive sleep apnea, multiple sclerosis, neurogenic bladder with chronic indwelling Foley catheter, depression, migraine, follicular lymphoma, presented to [facility] emergency department for chief concern of weakness of the left lower extremity. She was admitted for left lower extremity weakness presumed secondary to [place] and treated with IV Solu-Medrol 1 g status post 5-day treatment per neurology recommendations.  She was pending discharge however due to financial constraints she was not able to go to SNF and considered not a safe discharge home.  On 02/02/2020, [name] attempted to get out of bed and ambulated and fell. Since the fall she reports pain in the left knee. An x-ray was performed showing medial distal femur fracture. Orthopedic was consulted and recommended patient be transferred to [facility] for surgical repair due to the nature of her fracture.  On evaluation at bedside, [name] does not appear to be in acute distress and reports persistent and unchanged weakness of bilateral lower extremity with left being greater than right. She is able to minimally move bilateral lower extremity. ROS was negative for complaints. She denies, fever, chills, cough, chest pain, shortness of breath, abdominal pain. She reports she is having two bowel movements per day and this is not normal for her.   She is currently NPO in anticipation of left distal femur ORIF.  Review of Systems: As per HPI otherwise 10 point review of systems negative.  Past Medical History:  Diagnosis Date  . Abdominal aortic atherosclerosis ([place])  11/11/2016  . ADHD   . Anxiety   . COPD (chronic obstructive pulmonary disease) ([place])   . Depression    major depressive  . Dyspnea    doe  . Edema    left leg  . Follicular lymphoma ([place])    B Cell  . Follicular lymphoma grade II ([place])   . Hypertension   . Hypotension    idiopathic  . Kyphoscoliosis and scoliosis 11/26/2011  . Morbid obesity ([place]) 01/05/2016  . Multiple sclerosis ([place])   . Multiple sclerosis ([place])    1980's  . Neuromuscular disorder ([place])   . Obstructive and reflux uropathy    foley  . Pain    atypical facial  . Peripheral vascular disease of lower extremity with ulceration ([place]) 11/08/2015  . Skin ulcer ([place]) 11/08/2015  . Weakness    generalized. has MS   Past Surgical History:  Procedure Laterality Date  . BACK SURGERY N/A 2002  . CYST EXCISION     lower back  . INGUINAL LYMPH NODE BIOPSY Left 07/04/2016   Procedure: INGUINAL LYMPH NODE BIOPSY;  Surgeon: [name] Lye, MD;  Location: [facility] ORS;  Service: General;  Laterality: Left;  . PORTACATH PLACEMENT N/A 07/22/2016   Procedure: INSERTION PORT-A-CATH;  Surgeon: [name] Lye, MD;  Location: [facility] ORS;  Service: General;  Laterality: N/A;  . TONSILLECTOMY AND ADENOIDECTOMY    . TUBAL LIGATION     Social History:  reports that she quit smoking about 4 years ago. Her smoking use included cigarettes. She started smoking about 24 years ago. She has a 20.00 pack-year smoking history. She has never used smokeless tobacco. She reports  current drug use. Drug: Marijuana. She reports that she does not drink alcohol.  No Known Allergies  Family History  Problem Relation Age of Onset  . COPD Mother   . Diabetes Mother   . Heart failure Mother   . Alcohol abuse Father   . Kidney disease Father   . Kidney failure Father   . Arthritis Sister   . CAD Maternal Grandmother   . Stroke Maternal Grandfather   . Arthritis Sister   . Mental illness Sister   . Arthritis Brother    Family history:  Family history reviewed and not pertinent  Prior to Admission medications   Medication Sig Start Date End Date Taking? Authorizing Provider  acetaminophen (TYLENOL) 500 MG tablet Take 1,000 mg by mouth every 8 (eight) hours as needed for moderate pain.    [provider]  amLODipine (NORVASC) 5 MG tablet Take 5 mg by mouth daily. 09/17/19   [provider]  atorvastatin (LIPITOR) 10 MG tablet Take 10 mg by mouth at bedtime.  01/14/19   [provider]  baclofen (LIORESAL) 10 MG tablet Take 1 tablet (10 mg total) by mouth 3 (three) times daily for 5 days. 02/01/20 02/06/20  ShahmehdiValeria Batman, MD  budesonide-formoterol (SYMBICORT) 160-4.5 MCG/ACT inhaler Inhale 2 puffs into the lungs 2 (two) times daily.     [provider]  cyanocobalamin 1000 MCG tablet Take 1,000 mcg by mouth daily.    [provider]  docusate sodium (COLACE) 100 MG capsule Take 1 capsule (100 mg total) by mouth 2 (two) times daily as needed. 11/24/19   Enzo Bi, MD  DULoxetine (CYMBALTA) 60 MG capsule Take 1 capsule (60 mg total) by mouth every morning. 10/14/16   Vaughan Basta, MD  hydrALAZINE (APRESOLINE) 50 MG tablet Take 50 mg by mouth 3 (three) times daily.    [provider]  lisinopril (ZESTRIL) 20 MG tablet Take 0.5 tablets (10 mg total) by mouth daily for 10 days. 02/01/20 02/11/20  Deatra James, MD  Multiple Vitamin (MULTIVITAMIN WITH MINERALS) TABS tablet Take 1 tablet by mouth daily.    [provider]  MYRBETRIQ 50 MG TB24 tablet TAKE ONE TABLET BY MOUTH ONCE DAILY Patient taking differently: Take 50 mg by mouth daily.  03/08/19   Stoioff, Ronda Fairly, MD  polyethylene glycol powder (GLYCOLAX/MIRALAX) 17 GM/SCOOP powder Take 17 g by mouth daily as needed for mild constipation. 05/01/19   [provider]  QUEtiapine Fumarate (SEROQUEL XR) 150 MG 24 hr tablet Take 150 mg by mouth at bedtime.     [provider]  Teriflunomide 14 MG  TABS Take 14 mg by mouth daily. 12/03/19 03/02/20  [provider]   Physical Exam: T: 98.4, HR 64, RR 18, BP 136/66, SpO2 is 99% on room air noted on 02/03/20 at 2223.  Constitutional: NAD, calm, flat affect Eyes: PERRL, lids and conjunctivae normal ENMT: Mucous membranes are moist. Posterior pharynx clear of any exudate or lesions. Multiple teeth loss  Neck: normal, supple, no masses, no thyromegaly Respiratory: clear to auscultation bilaterally, no wheezing, no crackles. Normal respiratory effort. No accessory muscle use.  Cardiovascular: Regular rate and rhythm, no murmurs / rubs / gallops. No extremity edema. 2+ pedal pulses. No carotid bruits.  Abdomen: no tenderness, no masses palpated. No hepatosplenomegaly. Bowel sounds positive. Obese abdomen GU: foley catheter in place and foley bag has clear yellow urine from [facility] marked 01/26/20 Musculoskeletal: no clubbing / cyanosis. No joint deformity upper and  lower extremities. Good ROM, no contractures. Normal muscle tone.  Skin: no rashes, lesions, ulcers. No induration Neurologic: CN 2-12 grossly intact. Sensation intact, DTR normal. Strength 5/5 in all 4.  Psychiatric: Normal judgment and insight. Alert and oriented x 3. Flat affect. Mood appears depressed.   Labs on Admission: I have personally reviewed following labs and imaging studies  CBC: Recent Labs  Lab 01/28/20 0427 01/29/20 0348 01/30/20 0604 01/31/20 0324 02/01/20 0313  WBC 8.5 6.2 5.1 5.4 6.6  HGB 9.5* 8.2* 8.9* 8.2* 9.0*  HCT 27.9* 24.8* 27.1* 25.5* 26.6*  MCV 85.8 85.5 86.0 86.4 84.2  PLT 130* 143* 169 163 875   Basic Metabolic Panel: Recent Labs  Lab 01/28/20 0427 01/28/20 0427 01/29/20 0348 01/30/20 0604 01/31/20 0324 02/01/20 0313 02/02/20 0505  NA 139  --  135 139 139 137  --   K 4.2  --  3.9 4.2 4.4 4.0  --   CL 105  --  103 104 104 103  --   CO2 23  --  21* 24 28 26   --   GLUCOSE 154*  --  150* 163* 149* 136*  --   BUN 24*  --  31* 35* 36*  41*  --   CREATININE 1.12*   < > 1.15* 1.23* 1.16* 1.15* 1.18*  CALCIUM 8.8*  --  9.1 9.4 8.9 9.0  --    < > = values in this interval not displayed.   GFR: Estimated Creatinine Clearance: 55.8 mL/min (A) (by C-G formula based on SCr of 1.18 mg/dL (H)). Liver Function Tests: No results for input(s): AST, ALT, ALKPHOS, BILITOT, PROT, ALBUMIN in the last 168 hours. No results for input(s): LIPASE, AMYLASE in the last 168 hours. No results for input(s): AMMONIA in the last 168 hours. Coagulation Profile: No results for input(s): INR, PROTIME in the last 168 hours. Cardiac Enzymes: No results for input(s): CKTOTAL, CKMB, CKMBINDEX, TROPONINI in the last 168 hours. BNP (last 3 results) No results for input(s): PROBNP in the last 8760 hours. HbA1C: No results for input(s): HGBA1C in the last 72 hours. CBG: Recent Labs  Lab 02/02/20 2127 02/03/20 0721 02/03/20 1114 02/03/20 1640 02/03/20 2053  GLUCAP 111* 80 126* 112* 141*   Lipid Profile: No results for input(s): CHOL, HDL, LDLCALC, TRIG, CHOLHDL, LDLDIRECT in the last 72 hours. Thyroid Function Tests: No results for input(s): TSH, T4TOTAL, FREET4, T3FREE, THYROIDAB in the last 72 hours. Anemia Panel: No results for input(s): VITAMINB12, FOLATE, FERRITIN, TIBC, IRON, RETICCTPCT in the last 72 hours. Urine analysis:    Component Value Date/Time   COLORURINE YELLOW (A) 01/26/2020 2002   APPEARANCEUR CLOUDY (A) 01/26/2020 2002   APPEARANCEUR Hazy 08/02/2013 0020   LABSPEC 1.021 01/26/2020 2002   LABSPEC 1.012 08/02/2013 0020   PHURINE 5.0 01/26/2020 2002   GLUCOSEU NEGATIVE 01/26/2020 2002   GLUCOSEU Negative 08/02/2013 0020   HGBUR SMALL (A) 01/26/2020 2002   BILIRUBINUR NEGATIVE 01/26/2020 2002   BILIRUBINUR Negative 08/02/2013 0020   KETONESUR NEGATIVE 01/26/2020 2002   PROTEINUR 100 (A) 01/26/2020 2002   NITRITE NEGATIVE 01/26/2020 2002   LEUKOCYTESUR LARGE (A) 01/26/2020 2002   LEUKOCYTESUR 2+ 08/02/2013 0020    Radiological Exams on Admission: Personally reviewed and I agree with radiologist reading as below.  DG Knee 1-2 Views Left  Result Date: 02/03/2020 CLINICAL DATA:  69 year old female with left knee pain. Fall approximately 9 days ago. EXAM: LEFT KNEE - 1-2 VIEW COMPARISON:  Left hip series 01/26/2020. FINDINGS: Spiral  fracture of the distal femur metadiaphysis tracking into the intercondylar notch with mild displacement. Small joint effusion. Patella appears to remain intact. Proximal tibia and fibula appear intact. IMPRESSION: Mildly displaced, comminuted spiral fracture of the distal left femur extending into the intercondylar notch. Electronically Signed   By: Genevie Ann M.D.   On: 02/03/2020 11:45   CT KNEE LEFT WO CONTRAST  Result Date: 02/03/2020 CLINICAL DATA:  Distal femur fracture. EXAM: CT OF THE LEFT KNEE WITHOUT CONTRAST TECHNIQUE: Multidetector CT imaging of the left knee was performed according to the standard protocol. Multiplanar CT image reconstructions were also generated. COMPARISON:  Left knee x-rays from same day. FINDINGS: Bones/Joint/Cartilage Acute oblique fracture of the distal femoral metadiaphysis extending from the medial cortex into the knee joint through the intercondylar notch and mesial aspect of the lateral femoral condyle. The fracture is displaced up to 1.1 cm posteromedially at the proximal margin. No additional fracture. No dislocation. Mitral compartment of joint space narrowing with small marginal osteophytes. Osteopenia. Small joint effusion. Ligaments Ligaments are suboptimally evaluated by CT. Muscles and Tendons Grossly intact. Soft tissue No fluid collection or hematoma.  No soft tissue mass. IMPRESSION: 1. Acute mildly displaced oblique fracture of the distal femoral metadiaphysis extending into the knee joint through the intercondylar notch and mesial aspect of the lateral femoral condyle. Electronically Signed   By: Titus Dubin M.D.   On: 02/03/2020 17:17    EKG: Independently reviewed EKG from 01/26/20 showing sinus rhythm with low amplitude and rate of 96.  Assessment/Plan Principal Problem:   Closed fracture of distal end of left femur (HCC) Active Problems:   Major depressive disorder, recurrent, in partial remission (HCC)   Essential hypertension   Absence of bladder continence   Obstructive sleep apnea of adult   Weakness of left leg   Femur fracture, left [facility])   Is a 69 year old female with medical history significant for hypertension, COPD, OSA, multiple sclerosis, chronic neurogenic bladder with chronic indwelling Foley catheter, depression, migraine, follicular lymphoma, who was transferred from [facility] for left distal femur fracture in anticipation of ORIF.  # Left distal femur fracture  - ORIF scheduled today - NPO - Pain management is prn - Bed rest and fall precautions - PT/OT ordered for tomorrow   # MS - appears to be in remission at this time  - Per [facility] note, she completed 5 days of solu-medrol   # Hypertension - controlled - Home medications are amlodipine 5 mg daily, hydralazine 50 mg TID, lisinopril 10 mg daily - Resumed amlodipine and hydralazine, lisinopril held at [facility] due to AKI - BMP today for assess electrolytes and renal function  # Chronic neurogenic bladder - continue chronic indwelling foley catheter present  # OSA - patient reports she does not use CPAP at home  # UTI - present on presentation at [facility] - Per note, received cefepime and vanc in ED and transitioned to IV rocephine and completed treatment at [facility].  DVT prophylaxis: holding for now, can resume tonight pending ortho recommendations Code Status: FULL Disposition Plan: pending ortho recommendations and clinical course Consults called: ortho has been consulted Admission status: inpatient  Aman Bonet N Chesney Klimaszewski D.O. [facility]  If 7AM-7PM, please contact day-coverage [email or web address]  02/04/2020, 2:05 AM

## 2020-02-04 NOTE — H&P (View-Only) (Signed)
Reason for Consult:Left distal femur fx Referring Physician: Aelyn [name] is an 69 y.o. female.  HPI: [name] fell last week at home. She has MS that has left her with BLE weakness that is worse on the left side. She had immediate pain but was able to get up though she hasn't been able to put any weight down on the left leg since then. She presented and was admitted to [facility] on 9/29 but only c/o progressive weakness and does not appear to have mentioned the fall any pain. She was discharge to SNF but almost fell there and then c/o knee pain. X-rays showed the distal femur fx and orthopedic surgery was consulted. Orthopedic trauma consultation was requested and she was transferred to [facility] for definitive care.  Past Medical History:  Diagnosis Date  . Abdominal aortic atherosclerosis ([place]) 11/11/2016  . ADHD   . Anxiety   . COPD (chronic obstructive pulmonary disease) ([place])   . Depression    major depressive  . Dyspnea    doe  . Edema    left leg  . Follicular lymphoma ([place])    B Cell  . Follicular lymphoma grade II ([employer] [facility])   . Hypertension   . Hypotension    idiopathic  . Kyphoscoliosis and scoliosis 11/26/2011  . Morbid obesity ([place]) 01/05/2016  . Multiple sclerosis ([place])   . Multiple sclerosis ([place])    1980's  . Neuromuscular disorder ([place])   . Obstructive and reflux uropathy    foley  . Pain    atypical facial  . Peripheral vascular disease of lower extremity with ulceration ([place]) 11/08/2015  . Skin ulcer ([place]) 11/08/2015  . Weakness    generalized. has MS    Past Surgical History:  Procedure Laterality Date  . BACK SURGERY N/A 2002  . CYST EXCISION     lower back  . INGUINAL LYMPH NODE BIOPSY Left 07/04/2016   Procedure: INGUINAL LYMPH NODE BIOPSY;  Surgeon: Christene Lye, MD;  Location: [facility] ORS;  Service: General;  Laterality: Left;  . PORTACATH PLACEMENT N/A 07/22/2016   Procedure: INSERTION PORT-A-CATH;  Surgeon: Christene Lye, MD;  Location: [facility] ORS;   Service: General;  Laterality: N/A;  . TONSILLECTOMY AND ADENOIDECTOMY    . TUBAL LIGATION      Family History  Problem Relation Age of Onset  . COPD Mother   . Diabetes Mother   . Heart failure Mother   . Alcohol abuse Father   . Kidney disease Father   . Kidney failure Father   . Arthritis Sister   . CAD Maternal Grandmother   . Stroke Maternal Grandfather   . Arthritis Sister   . Mental illness Sister   . Arthritis Brother     Social History:  reports that she quit smoking about 4 years ago. Her smoking use included cigarettes. She started smoking about 24 years ago. She has a 20.00 pack-year smoking history. She has never used smokeless tobacco. She reports current drug use. Drug: Marijuana. She reports that she does not drink alcohol.  Allergies: No Known Allergies  Medications: I have reviewed the patient's current medications.  Results for orders placed or performed during the hospital encounter of 02/04/20 (from the past 48 hour(s))  CBC     Status: Abnormal   Collection Time: 02/04/20  6:12 AM  Result Value Ref Range   WBC 7.4 4.0 - 10.5 K/uL   RBC 3.27 (L) 3.87 - 5.11 MIL/uL   Hemoglobin 9.0 (  L) 12.0 - 15.0 g/dL   HCT 29.2 (L) 36 - 46 %   MCV 89.3 80.0 - 100.0 fL   MCH 27.5 26.0 - 34.0 pg   MCHC 30.8 30.0 - 36.0 g/dL   RDW 15.4 11.5 - 15.5 %   Platelets 208 150 - 400 K/uL   nRBC 0.0 0.0 - 0.2 %    Comment: Performed at [facility] [address], [place], [place] [address]  Basic metabolic panel     Status: Abnormal   Collection Time: 02/04/20  6:12 AM  Result Value Ref Range   Sodium 139 135 - 145 mmol/L   Potassium 4.6 3.5 - 5.1 mmol/L   Chloride 103 98 - 111 mmol/L   CO2 28 22 - 32 mmol/L   Glucose, Bld 109 (H) 70 - 99 mg/dL    Comment: Glucose reference range applies only to samples taken after fasting for at least 8 hours.   BUN 27 (H) 8 - 23 mg/dL   Creatinine, Ser 1.13 (H) 0.44 - 1.00 mg/dL   Calcium 8.9 8.9 - 10.3 mg/dL   GFR calc non Af  Amer 50 (L) >60 mL/min   Anion gap 8 5 - 15    Comment: Performed at [facility] [address] [place], [place] [address]    DG Knee 1-2 Views Left  Result Date: 02/03/2020 CLINICAL DATA:  69 year old female with left knee pain. Fall approximately 9 days ago. EXAM: LEFT KNEE - 1-2 VIEW COMPARISON:  Left hip series 01/26/2020. FINDINGS: Spiral fracture of the distal femur metadiaphysis tracking into the intercondylar notch with mild displacement. Small joint effusion. Patella appears to remain intact. Proximal tibia and fibula appear intact. IMPRESSION: Mildly displaced, comminuted spiral fracture of the distal left femur extending into the intercondylar notch. Electronically Signed   By: Genevie Ann M.D.   On: 02/03/2020 11:45   CT KNEE LEFT WO CONTRAST  Result Date: 02/03/2020 CLINICAL DATA:  Distal femur fracture. EXAM: CT OF THE LEFT KNEE WITHOUT CONTRAST TECHNIQUE: Multidetector CT imaging of the left knee was performed according to the standard protocol. Multiplanar CT image reconstructions were also generated. COMPARISON:  Left knee x-rays from same day. FINDINGS: Bones/Joint/Cartilage Acute oblique fracture of the distal femoral metadiaphysis extending from the medial cortex into the knee joint through the intercondylar notch and mesial aspect of the lateral femoral condyle. The fracture is displaced up to 1.1 cm posteromedially at the proximal margin. No additional fracture. No dislocation. Mitral compartment of joint space narrowing with small marginal osteophytes. Osteopenia. Small joint effusion. Ligaments Ligaments are suboptimally evaluated by CT. Muscles and Tendons Grossly intact. Soft tissue No fluid collection or hematoma.  No soft tissue mass. IMPRESSION: 1. Acute mildly displaced oblique fracture of the distal femoral metadiaphysis extending into the knee joint through the intercondylar notch and mesial aspect of the lateral femoral condyle. Electronically Signed   By: Titus Dubin M.D.   On: 02/03/2020 17:17    Review of Systems  HENT: Negative for ear discharge, ear pain, hearing loss and tinnitus.   Eyes: Negative for photophobia and pain.  Respiratory: Negative for cough and shortness of breath.   Cardiovascular: Negative for chest pain.  Gastrointestinal: Negative for abdominal pain, nausea and vomiting.  Genitourinary: Negative for dysuria, flank pain, frequency and urgency.  Musculoskeletal: Positive for arthralgias (Left knee). Negative for back pain, myalgias and neck pain.  Neurological: Positive for weakness (BLE). Negative for dizziness and headaches.  Hematological:  Does not bruise/bleed easily.  Psychiatric/Behavioral: The patient is not nervous/anxious.    Blood pressure (!) 141/65, pulse 60, temperature 98.3 F (36.8 C), temperature source Oral, resp. rate 16, SpO2 100 %. Physical Exam Constitutional:      General: She is not in acute distress.    Appearance: She is well-developed. She is not diaphoretic.  HENT:     Head: Normocephalic and atraumatic.  Eyes:     General: No scleral icterus.       Right eye: No discharge.        Left eye: No discharge.     Conjunctiva/sclera: Conjunctivae normal.  Cardiovascular:     Rate and Rhythm: Normal rate and regular rhythm.  Pulmonary:     Effort: Pulmonary effort is normal. No respiratory distress.  Musculoskeletal:     Cervical back: Normal range of motion.     Comments: LLE No traumatic wounds, ecchymosis, or rash  Mod TTP knee, swollen  No ankle effusion  Sens DPN, SPN, TN intact  Motor EHL, ext, flex, evers 5/5  DP 1+, PT 1+, No significant edema  Skin:    General: Skin is warm and dry.  Neurological:     Mental Status: She is alert.  Psychiatric:        Behavior: Behavior normal.     Assessment/Plan: Left distal femur fx -- Plan ORIF today by Dr. Doreatha Martin. Please keep NPO. Multiple medical problems including hypertension, COPD, OSA, MS, neurogentic bladder with indwelling foley  catheter, depression, migraine, and lymphoma -- per primary service    Lisette Abu, PA-C Orthopedic Surgery [phone] 02/04/2020, 9:42 AM

## 2020-02-04 NOTE — Discharge Summary (Addendum)
Physician Discharge Summary  [name] SPQ:[id number] DOB: 1950/05/10 DOA: 01/26/2020  PCP: Kirk Ruths, MD  Admit date: 01/26/2020 Discharge date: 02/03/2020  Admitted From: Home.  Disposition:  [place].   Recommendations for Outpatient Follow-up:  Follow up with orthopedics at [facility].   Discharge Condition:Stable.  CODE STATUS: Full code.  Diet recommendation: Heart Healthy   Brief/Interim Summary: [name] a 69 year old lady, with piror h/o hypertension, COPD, OSA, MS, neurogentic bladder with indwelling foley catheter, depression, migraine, lymphoma  Presents to ED with generalized weakness and weakness of the left lower extremity.  She underwent 5 days of high dose IV steroids for possible MS flare up. Pt/ot evaluations recommending SNF,  Yesterday she tried to get up from the bed to ambulate and almost fell. Since then she reports pain int he left knee. X rays of the left knee shows distal femur fracture. Orthopedics consulted , due to her nature of the fracture, she will require trauma orthopedics consultation at [facility]. Dr Doreatha Martin consulted , recommended transfer to [facility] for the surgical repair. She will be made NPO after midnight.   Discharge Diagnoses:  Principal Problem:   Weakness of left leg Active Problems:   COPD with bronchial hyperresponsiveness (HCC)   Essential hypertension   Neurogenic bladder   Multiple sclerosis (HCC)   UTI (urinary tract infection)   Left leg weakness   Left lower extremity weakness could be secondary to MS flareup Unable to ambulate today, when discharged.  Completed 5 day treatment of high dose IV steroids per neurology recommendations.  Resume Teriflunomide 14 mg daily.  PT/OT eval recommended SNF, but pt family unable to pay co pay. Tried to discharge her home, but she couldn't ambulate and almost fell. She reports pain in the left knee since then , x rays of the knee show mildly displaced comminuted distal femur fracture   Orthopedics consulted , due to the nature of the fracture, she will require trauma orthopedics consultation at [facility]. Dr Doreatha Martin consulted , recommended transfer to [facility] for the surgical repair. She will be made NPO after midnight for surgery in am.    Abnormal UA.  -Chronic indwelling catheter, follow up with Urologist as outpatient.  Patient has neurogenic bladder.Foley catheter was replaced in the ED Urine cultures show multiple bacteria.  She is asymptomatic.  She is afebrile and wbc count wnl.  Resumed Myrbetriq.  Blood cultures, no growth to date.    Acute on stage 3 a CKD:  Improved with IV fluids.  Creatinine at 1.18.    Hypertension, BP parameters are Optimal.  resume home meds except lisinopril for AKI.    COPD, OSA,patient does not wear CPAP at home No wheezing on exam today.    Depressionand migraine, no acute issues Stable on current meds. No suicidal ideations.   Severe debility/generalized weakness acute on chronic pain exacerbated likely by MS PT/OT EVAL recommending SNF, but family unable to pay the co pay, but she is not safe for discharge home and unable to ambulate .   Mildly displaced, comminuted spiral fracture of the distal left femur extending into the intercondylar notch. Orthopedics consulted , pain control.   Discharge Instructions  Discharge Instructions    Activity as tolerated - No restrictions   Complete by: As directed    Activity as tolerated - No restrictions   Complete by: As directed    Diet - low sodium heart healthy   Complete by: As directed    Discharge  instructions   Complete by: As directed    F/up with neurologist, PCP, urologist   Discharge instructions   Complete by: As directed    Discharge wound care:   Complete by: As directed    Per instructions, wound care team consultation   Discharge wound care:   Complete by: As directed    Per nursing instructions   Increase activity slowly   Complete by:  As directed    Increase activity slowly   Complete by: As directed      Allergies as of 02/03/2020   No Known Allergies     Medication List    STOP taking these medications   acidophilus Caps capsule   Biotin 1 MG Caps   buPROPion 300 MG 24 hr tablet Commonly known as: WELLBUTRIN XL   clonazePAM 1 MG tablet Commonly known as: KLONOPIN   famotidine 20 MG tablet Commonly known as: PEPCID   gabapentin 600 MG tablet Commonly known as: NEURONTIN   magnesium oxide 400 (241.3 Mg) MG tablet Commonly known as: MAG-OX   Trintellix 5 MG Tabs tablet Generic drug: vortioxetine HBr     TAKE these medications   acetaminophen 500 MG tablet Commonly known as: TYLENOL Take 1,000 mg by mouth every 8 (eight) hours as needed for moderate pain.   amLODipine 5 MG tablet Commonly known as: NORVASC Take 5 mg by mouth daily.   atorvastatin 10 MG tablet Commonly known as: LIPITOR Take 10 mg by mouth at bedtime.   baclofen 10 MG tablet Commonly known as: LIORESAL Take 1 tablet (10 mg total) by mouth 3 (three) times daily for 5 days.   budesonide-formoterol 160-4.5 MCG/ACT inhaler Commonly known as: SYMBICORT Inhale 2 puffs into the lungs 2 (two) times daily.   cyanocobalamin 1000 MCG tablet Take 1,000 mcg by mouth daily.   docusate sodium 100 MG capsule Commonly known as: COLACE Take 1 capsule (100 mg total) by mouth 2 (two) times daily as needed.   DULoxetine 60 MG capsule Commonly known as: CYMBALTA Take 1 capsule (60 mg total) by mouth every morning. What changed:   how much to take  when to take this   hydrALAZINE 50 MG tablet Commonly known as: APRESOLINE Take 50 mg by mouth 3 (three) times daily.   lisinopril 20 MG tablet Commonly known as: ZESTRIL Take 0.5 tablets (10 mg total) by mouth daily for 10 days. What changed: how much to take   multivitamin with minerals Tabs tablet Take 1 tablet by mouth daily.   Myrbetriq 50 MG Tb24 tablet Generic drug:  mirabegron ER TAKE ONE TABLET BY MOUTH ONCE DAILY What changed:   how much to take  how to take this  when to take this  additional instructions   polyethylene glycol powder 17 GM/SCOOP powder Commonly known as: GLYCOLAX/MIRALAX Take 17 g by mouth daily as needed for mild constipation.   QUEtiapine Fumarate 150 MG 24 hr tablet Commonly known as: SEROQUEL XR Take 150 mg by mouth at bedtime.   Teriflunomide 14 MG Tabs Take 14 mg by mouth daily.            Discharge Care Instructions  (From admission, onward)         Start     Ordered   02/01/20 0000  Discharge wound care:       Comments: Per instructions, wound care team consultation   02/01/20 0712   02/01/20 0000  Discharge wound care:       Comments: Per  nursing instructions   02/01/20 1044          No Known Allergies  Consultations:  Orthopedics.    Procedures/Studies: DG Knee 1-2 Views Left  Result Date: 02/03/2020 CLINICAL DATA:  69 year old female with left knee pain. Fall approximately 9 days ago. EXAM: LEFT KNEE - 1-2 VIEW COMPARISON:  Left hip series 01/26/2020. FINDINGS: Spiral fracture of the distal femur metadiaphysis tracking into the intercondylar notch with mild displacement. Small joint effusion. Patella appears to remain intact. Proximal tibia and fibula appear intact. IMPRESSION: Mildly displaced, comminuted spiral fracture of the distal left femur extending into the intercondylar notch. Electronically Signed   By: Genevie Ann M.D.   On: 02/03/2020 11:45   CT HEAD WO CONTRAST  Result Date: 01/26/2020 CLINICAL DATA:  Weakness. EXAM: CT HEAD WITHOUT CONTRAST TECHNIQUE: Contiguous axial images were obtained from the base of the skull through the vertex without intravenous contrast. COMPARISON:  June 20, 2019 FINDINGS: Brain: There is moderate severity cerebral atrophy with widening of the extra-axial spaces and ventricular dilatation. There are areas of decreased attenuation within the white  matter tracts of the supratentorial brain, consistent with microvascular disease changes. Vascular: No hyperdense vessel or unexpected calcification. Skull: Normal. Negative for fracture or focal lesion. Sinuses/Orbits: No acute finding. Other: None. IMPRESSION: 1. Generalized cerebral atrophy. 2. No acute intracranial abnormality. Electronically Signed   By: Virgina Norfolk M.D.   On: 01/26/2020 16:27   CT KNEE LEFT WO CONTRAST  Result Date: 02/03/2020 CLINICAL DATA:  Distal femur fracture. EXAM: CT OF THE LEFT KNEE WITHOUT CONTRAST TECHNIQUE: Multidetector CT imaging of the left knee was performed according to the standard protocol. Multiplanar CT image reconstructions were also generated. COMPARISON:  Left knee x-rays from same day. FINDINGS: Bones/Joint/Cartilage Acute oblique fracture of the distal femoral metadiaphysis extending from the medial cortex into the knee joint through the intercondylar notch and mesial aspect of the lateral femoral condyle. The fracture is displaced up to 1.1 cm posteromedially at the proximal margin. No additional fracture. No dislocation. Mitral compartment of joint space narrowing with small marginal osteophytes. Osteopenia. Small joint effusion. Ligaments Ligaments are suboptimally evaluated by CT. Muscles and Tendons Grossly intact. Soft tissue No fluid collection or hematoma.  No soft tissue mass. IMPRESSION: 1. Acute mildly displaced oblique fracture of the distal femoral metadiaphysis extending into the knee joint through the intercondylar notch and mesial aspect of the lateral femoral condyle. Electronically Signed   By: Titus Dubin M.D.   On: 02/03/2020 17:17   MR BRAIN W WO CONTRAST  Result Date: 01/27/2020 CLINICAL DATA:  Multiple sclerosis, new event. Left lower extremity weakness. EXAM: MRI HEAD WITHOUT AND WITH CONTRAST TECHNIQUE: Multiplanar, multiecho pulse sequences of the brain and surrounding structures were obtained without and with intravenous  contrast. CONTRAST:  81mL GADAVIST GADOBUTROL 1 MMOL/ML IV SOLN COMPARISON:  Head MRI 11/09/2018 FINDINGS: Brain: No acute infarct, mass, midline shift, or extra-axial fluid collection is identified. A chronic microhemorrhage near the genu of the left internal capsule is unchanged. Moderate cerebral atrophy is unchanged. Extensive T2 and FLAIR hyperintensity in the cerebral white matter bilaterally is unchanged including confluent periventricular signal abnormality and multiple juxtacortical lesions. There is diffuse FLAIR hyperintensity along the callososeptal interface with diffuse callosal thinning. There is no abnormal enhancement or restricted diffusion. A chronic left cerebellar infarct is unchanged. There is moderate cerebral atrophy. Vascular: Major intracranial vascular flow voids are preserved. Skull and upper cervical spine: Unremarkable bone marrow signal. Sinuses/Orbits: Unremarkable orbits.  Trace bilateral mastoid fluid. Clear paranasal sinuses. Other: None. IMPRESSION: Unchanged extensive cerebral white matter disease consistent with multiple sclerosis. No evidence of active demyelination or other acute intracranial abnormality. Electronically Signed   By: Logan Bores M.D.   On: 01/27/2020 18:52   DG Chest Port 1 View  Result Date: 01/26/2020 CLINICAL DATA:  Weakness. EXAM: PORTABLE CHEST 1 VIEW COMPARISON:  November 19, 2019 FINDINGS: Mild diffuse chronic appearing increased lung markings are seen. There is no evidence of acute infiltrate, pleural effusion or pneumothorax. The heart size and mediastinal contours are within normal limits. The visualized skeletal structures are unremarkable. IMPRESSION: No acute or active cardiopulmonary disease. Electronically Signed   By: Virgina Norfolk M.D.   On: 01/26/2020 16:24   DG Abd 2 Views  Result Date: 01/26/2020 CLINICAL DATA:  Left hip pain, weakness, fall EXAM: X-RAY ABDOMEN 2 VIEWS COMPARISON:  None. FINDINGS: Nonobstructive bowel gas pattern. No  free air or organomegaly. No confluent opacity within the lungs. No effusions. Heart is borderline in size. Scoliosis and degenerative changes in the thoracolumbar spine. Posterior fusion changes in the lower lumbar spine. IMPRESSION: No acute findings. Electronically Signed   By: Rolm Baptise M.D.   On: 01/26/2020 16:45   DG Hip Unilat W or Wo Pelvis 2-3 Views Left  Result Date: 01/26/2020 CLINICAL DATA:  Left hip pain after fall EXAM: DG HIP (WITH OR WITHOUT PELVIS) 2-3V LEFT COMPARISON:  None. FINDINGS: No acute bony abnormality. Specifically, no fracture, subluxation, or dislocation. IMPRESSION: No acute bony abnormality. Electronically Signed   By: Rolm Baptise M.D.   On: 01/26/2020 16:44    Subjective: Agreeable to the plan.   Discharge Exam: Vitals:   02/04/20 0418 02/04/20 0925  BP: (!) 117/43 (!) 141/65  Pulse: (!) 59 60  Resp: 17 16  Temp:  98.3 F (36.8 C)  SpO2: 96% 100%   Vitals:   02/03/20 2223 02/03/20 2319 02/04/20 0418 02/04/20 0925  BP: 136/66 140/61 (!) 117/43 (!) 141/65  Pulse: 64 66 (!) 59 60  Resp: 18  17 16   Temp: 98.4 F (36.9 C) 98.4 F (36.9 C)  98.3 F (36.8 C)  TempSrc: Oral Oral    SpO2: 99%  96% 100%  Weight:      Height:        General: Pt is alert, awake, not in acute distress Cardiovascular: RRR, S1/S2 +, no rubs, no gallops Respiratory: CTA bilaterally, no wheezing, no rhonchi Abdominal: Soft, NT, ND, bowel sounds + Extremities: left knee tenderness.     The results of significant diagnostics from this hospitalization (including imaging, microbiology, ancillary and laboratory) are listed below for reference.     Microbiology: Recent Results (from the past 240 hour(s))  Blood Culture (routine x 2)     Status: None   Collection Time: 01/26/20  3:04 PM   Specimen: BLOOD  Result Value Ref Range Status   Specimen Description BLOOD RIGHT ANTECUBITAL  Final   Special Requests   Final    BOTTLES DRAWN AEROBIC AND ANAEROBIC Blood Culture  adequate volume   Culture   Final    NO GROWTH 5 DAYS Performed at [facility], [address], [place], [place] [address]    Report Status 01/31/2020 FINAL  Final  Blood Culture (routine x 2)     Status: None   Collection Time: 01/26/20  3:04 PM   Specimen: BLOOD  Result Value Ref Range Status   Specimen Description BLOOD RIGHT ANTECUBITAL  Final  Special Requests   Final    BOTTLES DRAWN AEROBIC AND ANAEROBIC Blood Culture adequate volume   Culture   Final    NO GROWTH 5 DAYS Performed at [facility], [address] [place], [place] [address]    Report Status 01/31/2020 FINAL  Final  Respiratory Panel by RT PCR (Flu A&B, Covid) - Nasopharyngeal Swab     Status: None   Collection Time: 01/26/20  5:36 PM   Specimen: Nasopharyngeal Swab  Result Value Ref Range Status   SARS Coronavirus 2 by RT PCR NEGATIVE NEGATIVE Final    Comment: (NOTE) SARS-CoV-2 target nucleic acids are NOT DETECTED.  The SARS-CoV-2 RNA is generally detectable in upper respiratoy specimens during the acute phase of infection. The lowest concentration of SARS-CoV-2 viral copies this assay can detect is 131 copies/mL. A negative result does not preclude SARS-Cov-2 infection and should not be used as the sole basis for treatment or other patient management decisions. A negative result may occur with  improper specimen collection/handling, submission of specimen other than nasopharyngeal swab, presence of viral mutation(s) within the areas targeted by this assay, and inadequate number of viral copies (<131 copies/mL). A negative result must be combined with clinical observations, patient history, and epidemiological information. The expected result is Negative.  Fact Sheet for Patients:  PinkCheek.be  Fact Sheet for Healthcare Providers:  GravelBags.it  This test is no t yet approved or cleared by the [place] FDA and   has been authorized for detection and/or diagnosis of SARS-CoV-2 by FDA under an Emergency Use Authorization (EUA). This EUA will remain  in effect (meaning this test can be used) for the duration of the COVID-19 declaration under Section 564(b)(1) of the Act, 21 U.S.C. section 360bbb-3(b)(1), unless the authorization is terminated or revoked sooner.     Influenza A by PCR NEGATIVE NEGATIVE Final   Influenza B by PCR NEGATIVE NEGATIVE Final    Comment: (NOTE) The Xpert Xpress SARS-CoV-2/FLU/RSV assay is intended as an aid in  the diagnosis of influenza from Nasopharyngeal swab specimens and  should not be used as a sole basis for treatment. Nasal washings and  aspirates are unacceptable for Xpert Xpress SARS-CoV-2/FLU/RSV  testing.  Fact Sheet for Patients: PinkCheek.be  Fact Sheet for Healthcare Providers: GravelBags.it  This test is not yet approved or cleared by the [place] FDA and  has been authorized for detection and/or diagnosis of SARS-CoV-2 by  FDA under an Emergency Use Authorization (EUA). This EUA will remain  in effect (meaning this test can be used) for the duration of the  Covid-19 declaration under Section 564(b)(1) of the Act, 21  U.S.C. section 360bbb-3(b)(1), unless the authorization is  terminated or revoked. Performed at [facility], [address], [place], [place] [address]   Urine culture     Status: Abnormal   Collection Time: 01/26/20  8:02 PM   Specimen: In/Out Cath Urine  Result Value Ref Range Status   Specimen Description   Final    IN/OUT CATH URINE Performed at [facility], [address], [place], [place] [address]    Special Requests   Final    NONE Performed at [facility], [address] [place], [place] [address]    Culture MULTIPLE SPECIES PRESENT, SUGGEST RECOLLECTION (A)  Final   Report Status 01/27/2020 FINAL  Final     Labs: BNP  (last 3 results) No results for input(s): BNP in the last 8760 hours. Basic Metabolic Panel:  Recent Labs  Lab 01/29/20 0348 01/29/20 0348 01/30/20 0604 01/31/20 0324 02/01/20 0313 02/02/20 0505 02/04/20 0612  NA 135  --  139 139 137  --  139  K 3.9  --  4.2 4.4 4.0  --  4.6  CL 103  --  104 104 103  --  103  CO2 21*  --  24 28 26   --  28  GLUCOSE 150*  --  163* 149* 136*  --  109*  BUN 31*  --  35* 36* 41*  --  27*  CREATININE 1.15*   < > 1.23* 1.16* 1.15* 1.18* 1.13*  CALCIUM 9.1  --  9.4 8.9 9.0  --  8.9   < > = values in this interval not displayed.   Liver Function Tests: No results for input(s): AST, ALT, ALKPHOS, BILITOT, PROT, ALBUMIN in the last 168 hours. No results for input(s): LIPASE, AMYLASE in the last 168 hours. No results for input(s): AMMONIA in the last 168 hours. CBC: Recent Labs  Lab 01/29/20 0348 01/30/20 0604 01/31/20 0324 02/01/20 0313 02/04/20 0612  WBC 6.2 5.1 5.4 6.6 7.4  HGB 8.2* 8.9* 8.2* 9.0* 9.0*  HCT 24.8* 27.1* 25.5* 26.6* 29.2*  MCV 85.5 86.0 86.4 84.2 89.3  PLT 143* 169 163 178 208   Cardiac Enzymes: No results for input(s): CKTOTAL, CKMB, CKMBINDEX, TROPONINI in the last 168 hours. BNP: Invalid input(s): POCBNP CBG: Recent Labs  Lab 02/02/20 2127 02/03/20 0721 02/03/20 1114 02/03/20 1640 02/03/20 2053  GLUCAP 111* 80 126* 112* 141*   D-Dimer No results for input(s): DDIMER in the last 72 hours. Hgb A1c No results for input(s): HGBA1C in the last 72 hours. Lipid Profile No results for input(s): CHOL, HDL, LDLCALC, TRIG, CHOLHDL, LDLDIRECT in the last 72 hours. Thyroid function studies No results for input(s): TSH, T4TOTAL, T3FREE, THYROIDAB in the last 72 hours.  Invalid input(s): FREET3 Anemia work up No results for input(s): VITAMINB12, FOLATE, FERRITIN, TIBC, IRON, RETICCTPCT in the last 72 hours. Urinalysis    Component Value Date/Time   COLORURINE YELLOW (A) 01/26/2020 2002   APPEARANCEUR CLOUDY (A)  01/26/2020 2002   APPEARANCEUR Hazy 08/02/2013 0020   LABSPEC 1.021 01/26/2020 2002   LABSPEC 1.012 08/02/2013 0020   PHURINE 5.0 01/26/2020 2002   GLUCOSEU NEGATIVE 01/26/2020 2002   GLUCOSEU Negative 08/02/2013 0020   HGBUR SMALL (A) 01/26/2020 2002   BILIRUBINUR NEGATIVE 01/26/2020 2002   BILIRUBINUR Negative 08/02/2013 0020   KETONESUR NEGATIVE 01/26/2020 2002   PROTEINUR 100 (A) 01/26/2020 2002   NITRITE NEGATIVE 01/26/2020 2002   LEUKOCYTESUR LARGE (A) 01/26/2020 2002   LEUKOCYTESUR 2+ 08/02/2013 0020   Sepsis Labs Invalid input(s): PROCALCITONIN,  WBC,  LACTICIDVEN Microbiology Recent Results (from the past 240 hour(s))  Blood Culture (routine x 2)     Status: None   Collection Time: 01/26/20  3:04 PM   Specimen: BLOOD  Result Value Ref Range Status   Specimen Description BLOOD RIGHT ANTECUBITAL  Final   Special Requests   Final    BOTTLES DRAWN AEROBIC AND ANAEROBIC Blood Culture adequate volume   Culture   Final    NO GROWTH 5 DAYS Performed at [facility], [address] [place], [place] [address]    Report Status 01/31/2020 FINAL  Final  Blood Culture (routine x 2)     Status: None   Collection Time: 01/26/20  3:04 PM   Specimen: BLOOD  Result Value Ref Range Status   Specimen Description  BLOOD RIGHT ANTECUBITAL  Final   Special Requests   Final    BOTTLES DRAWN AEROBIC AND ANAEROBIC Blood Culture adequate volume   Culture   Final    NO GROWTH 5 DAYS Performed at [facility], [address] [place], [place] [address]    Report Status 01/31/2020 FINAL  Final  Respiratory Panel by RT PCR (Flu A&B, Covid) - Nasopharyngeal Swab     Status: None   Collection Time: 01/26/20  5:36 PM   Specimen: Nasopharyngeal Swab  Result Value Ref Range Status   SARS Coronavirus 2 by RT PCR NEGATIVE NEGATIVE Final    Comment: (NOTE) SARS-CoV-2 target nucleic acids are NOT DETECTED.  The SARS-CoV-2 RNA is generally detectable in upper  respiratoy specimens during the acute phase of infection. The lowest concentration of SARS-CoV-2 viral copies this assay can detect is 131 copies/mL. A negative result does not preclude SARS-Cov-2 infection and should not be used as the sole basis for treatment or other patient management decisions. A negative result may occur with  improper specimen collection/handling, submission of specimen other than nasopharyngeal swab, presence of viral mutation(s) within the areas targeted by this assay, and inadequate number of viral copies (<131 copies/mL). A negative result must be combined with clinical observations, patient history, and epidemiological information. The expected result is Negative.  Fact Sheet for Patients:  PinkCheek.be  Fact Sheet for Healthcare Providers:  GravelBags.it  This test is no t yet approved or cleared by the [place] FDA and  has been authorized for detection and/or diagnosis of SARS-CoV-2 by FDA under an Emergency Use Authorization (EUA). This EUA will remain  in effect (meaning this test can be used) for the duration of the COVID-19 declaration under Section 564(b)(1) of the Act, 21 U.S.C. section 360bbb-3(b)(1), unless the authorization is terminated or revoked sooner.     Influenza A by PCR NEGATIVE NEGATIVE Final   Influenza B by PCR NEGATIVE NEGATIVE Final    Comment: (NOTE) The Xpert Xpress SARS-CoV-2/FLU/RSV assay is intended as an aid in  the diagnosis of influenza from Nasopharyngeal swab specimens and  should not be used as a sole basis for treatment. Nasal washings and  aspirates are unacceptable for Xpert Xpress SARS-CoV-2/FLU/RSV  testing.  Fact Sheet for Patients: PinkCheek.be  Fact Sheet for Healthcare Providers: GravelBags.it  This test is not yet approved or cleared by the [place] FDA and  has been  authorized for detection and/or diagnosis of SARS-CoV-2 by  FDA under an Emergency Use Authorization (EUA). This EUA will remain  in effect (meaning this test can be used) for the duration of the  Covid-19 declaration under Section 564(b)(1) of the Act, 21  U.S.C. section 360bbb-3(b)(1), unless the authorization is  terminated or revoked. Performed at [facility], [address], [place], [place] [address]   Urine culture     Status: Abnormal   Collection Time: 01/26/20  8:02 PM   Specimen: In/Out Cath Urine  Result Value Ref Range Status   Specimen Description   Final    IN/OUT CATH URINE Performed at [facility], [address], [place], [place] [address]    Special Requests   Final    NONE Performed at [facility], [address] [place],  [address]    Culture MULTIPLE SPECIES PRESENT, SUGGEST RECOLLECTION (A)  Final   Report Status 01/27/2020 FINAL  Final     Time coordinating discharge: 32 minutes  SIGNED:   Hosie Poisson,  MD  [facility]

## 2020-02-04 NOTE — Anesthesia Postprocedure Evaluation (Signed)
Anesthesia Post Note  Patient: [name]  Procedure(s) Performed: OPEN REDUCTION INTERNAL FIXATION (ORIF) DISTAL FEMUR FRACTURE (Left Leg Lower)     Patient location during evaluation: PACU Anesthesia Type: General Level of consciousness: awake and alert Pain management: pain level controlled Vital Signs Assessment: post-procedure vital signs reviewed and stable Respiratory status: spontaneous breathing, nonlabored ventilation and respiratory function stable Cardiovascular status: blood pressure returned to baseline and stable Postop Assessment: no apparent nausea or vomiting Anesthetic complications: no   No complications documented.  Last Vitals:  Vitals:   02/04/20 1725 02/04/20 1730  BP: (!) 126/49   Pulse: 64 65  Resp: (!) 8 10  Temp:  (!) 36.1 C  SpO2: 100% 93%    Last Pain:  Vitals:   02/04/20 1641  TempSrc:   PainSc: 0-No pain                 Audry Pili

## 2020-02-04 NOTE — Transfer of Care (Signed)
Immediate Anesthesia Transfer of Care Note  Patient: [name]  Procedure(s) Performed: OPEN REDUCTION INTERNAL FIXATION (ORIF) DISTAL FEMUR FRACTURE (Left Leg Lower)  Patient Location: PACU  Anesthesia Type:General  Level of Consciousness: drowsy and patient cooperative  Airway & Oxygen Therapy: Patient Spontanous Breathing and Patient connected to face mask oxygen  Post-op Assessment: Report given to RN and Post -op Vital signs reviewed and stable  Post vital signs: Reviewed and stable  Last Vitals:  Vitals Value Taken Time  BP 88/69 02/04/20 1639  Temp    Pulse 73 02/04/20 1639  Resp 12 02/04/20 1639  SpO2 92 % 02/04/20 1639  Vitals shown include unvalidated device data.  Last Pain:  Vitals:   02/04/20 1017  TempSrc:   PainSc: 0-No pain         Complications: No complications documented.

## 2020-02-04 NOTE — Anesthesia Preprocedure Evaluation (Addendum)
Anesthesia Evaluation  Patient identified by MRN, date of birth, ID band Patient awake    Reviewed: Allergy & Precautions, NPO status , Patient's Chart, lab work & pertinent test results  History of Anesthesia Complications Negative for: history of anesthetic complications  Airway Mallampati: I  TM Distance: >3 FB Neck ROM: Full    Dental  (+) Poor Dentition, Chipped, Missing, Loose   Pulmonary sleep apnea , COPD, former smoker,    Pulmonary exam normal        Cardiovascular hypertension, negative cardio ROS Normal cardiovascular exam  ECHO 6/21   1. Left ventricular ejection fraction, by estimation, is 55 to 60%. The left ventricle has normal function. The left ventricle has no regional wall motion abnormalities. There is moderate asymmetric left ventricular hypertrophy of the septal segment.  Left ventricular diastolic parameters are consistent with Grade I diastolic dysfunction (impaired relaxation). Elevated left atrial pressure. 2. Right ventricular systolic function is normal. The right ventricular size is normal. Tricuspid regurgitation signal is inadequate for assessing PA pressure. 3. The mitral valve was not well visualized. Trivial mitral valve regurgitation. No evidence of mitral stenosis. 4. The aortic valve was not well visualized. Aortic valve regurgitation is not visualized. No aortic stenosis is present.   Neuro/Psych  Headaches, PSYCHIATRIC DISORDERS Anxiety Depression  Neuromuscular disease    GI/Hepatic negative GI ROS, Neg liver ROS,   Endo/Other  negative endocrine ROS  Renal/GU Renal InsufficiencyRenal diseasenegative Renal ROS     Musculoskeletal negative musculoskeletal ROS (+)   Abdominal Normal abdominal exam  (+) + obese,   Peds  Hematology  (+) Blood dyscrasia, anemia , Lymphoma   Anesthesia Other Findings   Reproductive/Obstetrics                            Anesthesia Physical Anesthesia Plan  ASA: III  Anesthesia Plan: General   Post-op Pain Management:    Induction:   PONV Risk Score and Plan: 3 and Ondansetron, Treatment may vary due to age or medical condition, Midazolam and Dexamethasone  Airway Management Planned: Oral ETT  Additional Equipment:   Intra-op Plan:   Post-operative Plan: Extubation in OR  Informed Consent: I have reviewed the patients History and Physical, chart, labs and discussed the procedure including the risks, benefits and alternatives for the proposed anesthesia with the patient or authorized representative who has indicated his/her understanding and acceptance.     Dental advisory given  Plan Discussed with: Anesthesiologist, CRNA and Surgeon  Anesthesia Plan Comments:        Anesthesia Quick Evaluation

## 2020-02-04 NOTE — Progress Notes (Signed)
OT Cancellation Note  Patient Details Name: [name] MRN: [id number] DOB: [birth year]   Cancelled Treatment:    Reason Eval/Treat Not Completed: Medical issues which prohibited therapy. Pt with active bedrest orders and plans for ORIF of L LE today. Will follow-up for OT eval post-op.   Layla Maw 02/04/2020, 7:16 AM

## 2020-02-04 NOTE — Consult Note (Signed)
Reason for Consult:Left distal femur fx Referring Physician: Crista [name] is an 69 y.o. female.  HPI: [name] fell last week at home. She has MS that has left her with BLE weakness that is worse on the left side. She had immediate pain but was able to get up though she hasn't been able to put any weight down on the left leg since then. She presented and was admitted to [facility] on 9/29 but only c/o progressive weakness and does not appear to have mentioned the fall any pain. She was discharge to SNF but almost fell there and then c/o knee pain. X-rays showed the distal femur fx and orthopedic surgery was consulted. Orthopedic trauma consultation was requested and she was transferred to [facility] for definitive care.  Past Medical History:  Diagnosis Date  . Abdominal aortic atherosclerosis ([place]) 11/11/2016  . ADHD   . Anxiety   . COPD (chronic obstructive pulmonary disease) ([place])   . Depression    major depressive  . Dyspnea    doe  . Edema    left leg  . Follicular lymphoma ([place])    B Cell  . Follicular lymphoma grade II ([place])   . Hypertension   . Hypotension    idiopathic  . Kyphoscoliosis and scoliosis 11/26/2011  . Morbid obesity ([place]) 01/05/2016  . Multiple sclerosis ([place])   . Multiple sclerosis ([place])    1980's  . Neuromuscular disorder ([place])   . Obstructive and reflux uropathy    foley  . Pain    atypical facial  . Peripheral vascular disease of lower extremity with ulceration ([place]) 11/08/2015  . Skin ulcer ([place]) 11/08/2015  . Weakness    generalized. has MS    Past Surgical History:  Procedure Laterality Date  . BACK SURGERY N/A 2002  . CYST EXCISION     lower back  . INGUINAL LYMPH NODE BIOPSY Left 07/04/2016   Procedure: INGUINAL LYMPH NODE BIOPSY;  Surgeon: [name] Lye, MD;  Location: [facility] ORS;  Service: General;  Laterality: Left;  . PORTACATH PLACEMENT N/A 07/22/2016   Procedure: INSERTION PORT-A-CATH;  Surgeon: [name] Lye, MD;  Location: [facility] ORS;   Service: General;  Laterality: N/A;  . TONSILLECTOMY AND ADENOIDECTOMY    . TUBAL LIGATION      Family History  Problem Relation Age of Onset  . COPD Mother   . Diabetes Mother   . Heart failure Mother   . Alcohol abuse Father   . Kidney disease Father   . Kidney failure Father   . Arthritis Sister   . CAD Maternal Grandmother   . Stroke Maternal Grandfather   . Arthritis Sister   . Mental illness Sister   . Arthritis Brother     Social History:  reports that she quit smoking about 4 years ago. Her smoking use included cigarettes. She started smoking about 24 years ago. She has a 20.00 pack-year smoking history. She has never used smokeless tobacco. She reports current drug use. Drug: Marijuana. She reports that she does not drink alcohol.  Allergies: No Known Allergies  Medications: I have reviewed the patient's current medications.  Results for orders placed or performed during the hospital encounter of 02/04/20 (from the past 48 hour(s))  CBC     Status: Abnormal   Collection Time: 02/04/20  6:12 AM  Result Value Ref Range   WBC 7.4 4.0 - 10.5 K/uL   RBC 3.27 (L) 3.87 - 5.11 MIL/uL   Hemoglobin 9.0 (  L) 12.0 - 15.0 g/dL   HCT 29.2 (L) 36 - 46 %   MCV 89.3 80.0 - 100.0 fL   MCH 27.5 26.0 - 34.0 pg   MCHC 30.8 30.0 - 36.0 g/dL   RDW 15.4 11.5 - 15.5 %   Platelets 208 150 - 400 K/uL   nRBC 0.0 0.0 - 0.2 %    Comment: Performed at [facility] [address] [place], [place] [address]  Basic metabolic panel     Status: Abnormal   Collection Time: 02/04/20  6:12 AM  Result Value Ref Range   Sodium 139 135 - 145 mmol/L   Potassium 4.6 3.5 - 5.1 mmol/L   Chloride 103 98 - 111 mmol/L   CO2 28 22 - 32 mmol/L   Glucose, Bld 109 (H) 70 - 99 mg/dL    Comment: Glucose reference range applies only to samples taken after fasting for at least 8 hours.   BUN 27 (H) 8 - 23 mg/dL   Creatinine, Ser 1.13 (H) 0.44 - 1.00 mg/dL   Calcium 8.9 8.9 - 10.3 mg/dL   GFR calc non Af  Amer 50 (L) >60 mL/min   Anion gap 8 5 - 15    Comment: Performed at [facility] [address] [place], [place] [address]    DG Knee 1-2 Views Left  Result Date: 02/03/2020 CLINICAL DATA:  69 year old female with left knee pain. Fall approximately 9 days ago. EXAM: LEFT KNEE - 1-2 VIEW COMPARISON:  Left hip series 01/26/2020. FINDINGS: Spiral fracture of the distal femur metadiaphysis tracking into the intercondylar notch with mild displacement. Small joint effusion. Patella appears to remain intact. Proximal tibia and fibula appear intact. IMPRESSION: Mildly displaced, comminuted spiral fracture of the distal left femur extending into the intercondylar notch. Electronically Signed   By: [name] M.D.   On: 02/03/2020 11:45   CT KNEE LEFT WO CONTRAST  Result Date: 02/03/2020 CLINICAL DATA:  Distal femur fracture. EXAM: CT OF THE LEFT KNEE WITHOUT CONTRAST TECHNIQUE: Multidetector CT imaging of the left knee was performed according to the standard protocol. Multiplanar CT image reconstructions were also generated. COMPARISON:  Left knee x-rays from same day. FINDINGS: Bones/Joint/Cartilage Acute oblique fracture of the distal femoral metadiaphysis extending from the medial cortex into the knee joint through the intercondylar notch and mesial aspect of the lateral femoral condyle. The fracture is displaced up to 1.1 cm posteromedially at the proximal margin. No additional fracture. No dislocation. Mitral compartment of joint space narrowing with small marginal osteophytes. Osteopenia. Small joint effusion. Ligaments Ligaments are suboptimally evaluated by CT. Muscles and Tendons Grossly intact. Soft tissue No fluid collection or hematoma.  No soft tissue mass. IMPRESSION: 1. Acute mildly displaced oblique fracture of the distal femoral metadiaphysis extending into the knee joint through the intercondylar notch and mesial aspect of the lateral femoral condyle. Electronically Signed   By: [name] M.D.   On: 02/03/2020 17:17    Review of Systems  HENT: Negative for ear discharge, ear pain, hearing loss and tinnitus.   Eyes: Negative for photophobia and pain.  Respiratory: Negative for cough and shortness of breath.   Cardiovascular: Negative for chest pain.  Gastrointestinal: Negative for abdominal pain, nausea and vomiting.  Genitourinary: Negative for dysuria, flank pain, frequency and urgency.  Musculoskeletal: Positive for arthralgias (Left knee). Negative for back pain, myalgias and neck pain.  Neurological: Positive for weakness (BLE). Negative for dizziness and headaches.  Hematological:  Does not bruise/bleed easily.  Psychiatric/Behavioral: The patient is not nervous/anxious.    Blood pressure (!) 141/65, pulse 60, temperature 98.3 F (36.8 C), temperature source Oral, resp. rate 16, SpO2 100 %. Physical Exam Constitutional:      General: She is not in acute distress.    Appearance: She is well-developed. She is not diaphoretic.  HENT:     Head: Normocephalic and atraumatic.  Eyes:     General: No scleral icterus.       Right eye: No discharge.        Left eye: No discharge.     Conjunctiva/sclera: Conjunctivae normal.  Cardiovascular:     Rate and Rhythm: Normal rate and regular rhythm.  Pulmonary:     Effort: Pulmonary effort is normal. No respiratory distress.  Musculoskeletal:     Cervical back: Normal range of motion.     Comments: LLE No traumatic wounds, ecchymosis, or rash  Mod TTP knee, swollen  No ankle effusion  Sens DPN, SPN, TN intact  Motor EHL, ext, flex, evers 5/5  DP 1+, PT 1+, No significant edema  Skin:    General: Skin is warm and dry.  Neurological:     Mental Status: She is alert.  Psychiatric:        Behavior: Behavior normal.     Assessment/Plan: Left distal femur fx -- Plan ORIF today by Dr. Doreatha Martin. Please keep NPO. Multiple medical problems including hypertension, COPD, OSA, MS, neurogentic bladder with indwelling foley  catheter, depression, migraine, and lymphoma -- per primary service    Lisette Abu, PA-C Orthopedic Surgery [phone] 02/04/2020, 9:42 AM

## 2020-02-04 NOTE — Progress Notes (Signed)
[name]  PNT:[id number] DOB: Jun 07, 1950 DOA: 02/04/2020 PCP: Kirk Ruths, MD    Brief Narrative:  69 year old with a history of HTN, COPD, OSA, MS, chronic neurogenic bladder with indwelling Foley catheter, depression, and follicular lymphoma who presented to the [facility] ED with weakness of the left lower extremity.  She was admitted with a presumed MS flare and treatment was initiated with high-dose Solu-Medrol.  Her treatment was completed and she was pending discharge while a SNF was being located.  On 10/6 she attempted to get out of bed alone and fell, after which she began to complain of severe left knee pain.  Plain film x-ray revealed a medial distal femur fracture on the left.  Given the complex nature of her fracture the patient was transferred to [facility]  Significant Events:  10/8 transfer to [facility]  Antimicrobials:  None  DVT prophylaxis: To be resumed postoperatively  Subjective: Follow-up review completed.  Assessment & Plan:  Mildly displaced comminuted spiral fracture distal left femur extending into the intercondylar notch Care per orthopedic surgery with plan for OR today  Multiple sclerosis recently completed 5 days of high-dose Solu-Medrol under the care of neurology -appears to be in remission at present  Acute kidney injury on CKD stage IIIa Resolved with volume resuscitation at [facility]  HTN Usual lisinopril on hold due to acute kidney injury -continue other usual home meds  Chronic neurogenic bladder Chronic indwelling Foley remains  Obstructive sleep apnea Patient does not use her CPAP at home  Abnormal UA Present at time of admission to [facility] -not likely UTI as patient was asymptomatic and has a chronic indwelling Foley - was treated with antibiotic at [facility] nonetheless  Severe debility -generalized weakness PT/OT recommended SNF but family was unable to afford the co-pay  Code Status: FULL CODE Family Communication:  Status is:  Inpatient  Remains inpatient appropriate because:Inpatient level of care appropriate due to severity of illness   Dispo: The patient is from: Home              Anticipated d/c is to: SNF              Anticipated d/c date is: 2 days              Patient currently is not medically stable to d/c.   Consultants:  none  Objective: There were no vitals taken for this visit.  Intake/Output Summary (Last 24 hours) at 02/04/2020 0924 Last data filed at 02/04/2020 0700 Gross per 24 hour  Intake 0 ml  Output 350 ml  Net -350 ml   There were no vitals filed for this visit.  Examination: Pt examined by [facility] MD earlier this day.   CBC: Recent Labs  Lab 01/31/20 0324 02/01/20 0313 02/04/20 0612  WBC 5.4 6.6 7.4  HGB 8.2* 9.0* 9.0*  HCT 25.5* 26.6* 29.2*  MCV 86.4 84.2 89.3  PLT 163 178 086   Basic Metabolic Panel: Recent Labs  Lab 01/31/20 0324 01/31/20 0324 02/01/20 0313 02/02/20 0505 02/04/20 0612  NA 139  --  137  --  139  K 4.4  --  4.0  --  4.6  CL 104  --  103  --  103  CO2 28  --  26  --  28  GLUCOSE 149*  --  136*  --  109*  BUN 36*  --  41*  --  27*  CREATININE 1.16*   < > 1.15* 1.18* 1.13*  CALCIUM 8.9  --  9.0  --  8.9   < > = values in this interval not displayed.   GFR: Estimated Creatinine Clearance: 58.3 mL/min (A) (by C-G formula based on SCr of 1.13 mg/dL (H)).  Liver Function Tests: No results for input(s): AST, ALT, ALKPHOS, BILITOT, PROT, ALBUMIN in the last 168 hours. No results for input(s): LIPASE, AMYLASE in the last 168 hours. No results for input(s): AMMONIA in the last 168 hours.  Coagulation Profile: No results for input(s): INR, PROTIME in the last 168 hours.  Cardiac Enzymes: No results for input(s): CKTOTAL, CKMB, CKMBINDEX, TROPONINI in the last 168 hours.  HbA1C: Hgb A1c MFr Bld  Date/Time Value Ref Range Status  01/26/2020 03:03 PM 5.4 4.8 - 5.6 % Final    Comment:    (NOTE) Pre diabetes:          5.7%-6.4%  Diabetes:               >6.4%  Glycemic control for   <7.0% adults with diabetes   06/20/2019 06:27 PM 5.0 4.8 - 5.6 % Final    Comment:    (NOTE) Pre diabetes:          5.7%-6.4% Diabetes:              >6.4% Glycemic control for   <7.0% adults with diabetes     CBG: Recent Labs  Lab 02/02/20 2127 02/03/20 0721 02/03/20 1114 02/03/20 1640 02/03/20 2053  GLUCAP 111* 80 126* 112* 141*    Recent Results (from the past 240 hour(s))  Blood Culture (routine x 2)     Status: None   Collection Time: 01/26/20  3:04 PM   Specimen: BLOOD  Result Value Ref Range Status   Specimen Description BLOOD RIGHT ANTECUBITAL  Final   Special Requests   Final    BOTTLES DRAWN AEROBIC AND ANAEROBIC Blood Culture adequate volume   Culture   Final    NO GROWTH 5 DAYS Performed at [facility], [address], [place], [place] [address]    Report Status 01/31/2020 FINAL  Final  Blood Culture (routine x 2)     Status: None   Collection Time: 01/26/20  3:04 PM   Specimen: BLOOD  Result Value Ref Range Status   Specimen Description BLOOD RIGHT ANTECUBITAL  Final   Special Requests   Final    BOTTLES DRAWN AEROBIC AND ANAEROBIC Blood Culture adequate volume   Culture   Final    NO GROWTH 5 DAYS Performed at [facility], [address] [place], [place] [address]    Report Status 01/31/2020 FINAL  Final  Respiratory Panel by RT PCR (Flu A&B, Covid) - Nasopharyngeal Swab     Status: None   Collection Time: 01/26/20  5:36 PM   Specimen: Nasopharyngeal Swab  Result Value Ref Range Status   SARS Coronavirus 2 by RT PCR NEGATIVE NEGATIVE Final    Comment: (NOTE) SARS-CoV-2 target nucleic acids are NOT DETECTED.  The SARS-CoV-2 RNA is generally detectable in upper respiratoy specimens during the acute phase of infection. The lowest concentration of SARS-CoV-2 viral copies this assay can detect is 131 copies/mL. A negative result does not preclude SARS-Cov-2 infection and should not be  used as the sole basis for treatment or other patient management decisions. A negative result may occur with  improper specimen collection/handling, submission of specimen other than nasopharyngeal swab, presence of viral mutation(s) within the areas targeted by this assay, and inadequate  number of viral copies (<131 copies/mL). A negative result must be combined with clinical observations, patient history, and epidemiological information. The expected result is Negative.  Fact Sheet for Patients:  PinkCheek.be  Fact Sheet for Healthcare Providers:  GravelBags.it  This test is no t yet approved or cleared by the [place] FDA and  has been authorized for detection and/or diagnosis of SARS-CoV-2 by FDA under an Emergency Use Authorization (EUA). This EUA will remain  in effect (meaning this test can be used) for the duration of the COVID-19 declaration under Section 564(b)(1) of the Act, 21 U.S.C. section 360bbb-3(b)(1), unless the authorization is terminated or revoked sooner.     Influenza A by PCR NEGATIVE NEGATIVE Final   Influenza B by PCR NEGATIVE NEGATIVE Final    Comment: (NOTE) The Xpert Xpress SARS-CoV-2/FLU/RSV assay is intended as an aid in  the diagnosis of influenza from Nasopharyngeal swab specimens and  should not be used as a sole basis for treatment. Nasal washings and  aspirates are unacceptable for Xpert Xpress SARS-CoV-2/FLU/RSV  testing.  Fact Sheet for Patients: PinkCheek.be  Fact Sheet for Healthcare Providers: GravelBags.it  This test is not yet approved or cleared by the [place] FDA and  has been authorized for detection and/or diagnosis of SARS-CoV-2 by  FDA under an Emergency Use Authorization (EUA). This EUA will remain  in effect (meaning this test can be used) for the duration of the  Covid-19 declaration under Section  564(b)(1) of the Act, 21  U.S.C. section 360bbb-3(b)(1), unless the authorization is  terminated or revoked. Performed at [facility], [address], [place], [place] [address]   Urine culture     Status: Abnormal   Collection Time: 01/26/20  8:02 PM   Specimen: In/Out Cath Urine  Result Value Ref Range Status   Specimen Description   Final    IN/OUT CATH URINE Performed at [facility], [address], [place], [place] [address]    Special Requests   Final    NONE Performed at [facility], [address] [place], [place] [address]    Culture MULTIPLE SPECIES PRESENT, SUGGEST RECOLLECTION (A)  Final   Report Status 01/27/2020 FINAL  Final     Scheduled Meds: . acetaminophen  1,000 mg Oral Q8H  . amLODipine  5 mg Oral Daily  . atorvastatin  10 mg Oral QHS  . [START ON 02/05/2020] baclofen  10 mg Oral TID  . DULoxetine  30 mg Oral Daily  . hydrALAZINE  50 mg Oral Q8H  . mometasone-formoterol  2 puff Inhalation BID  . Teriflunomide  14 mg Oral Daily   Continuous Infusions: . methocarbamol (ROBAXIN) IV       LOS: 0 days   Cherene Altes, MD [facility] Office  [phone] Pager - Text Page per Amion  If 7PM-7AM, please contact night-coverage per Amion 02/04/2020, 9:24 AM

## 2020-02-04 NOTE — Anesthesia Procedure Notes (Signed)
Procedure Name: Intubation Date/Time: 02/04/2020 1:58 PM Performed by: Bryson Corona, CRNA Pre-anesthesia Checklist: Patient identified, Emergency Drugs available, Suction available and Patient being monitored Patient Re-evaluated:Patient Re-evaluated prior to induction Oxygen Delivery Method: Circle System Utilized Preoxygenation: Pre-oxygenation with 100% oxygen Induction Type: IV induction Ventilation: Mask ventilation without difficulty Laryngoscope Size: Mac and 3 Grade View: Grade I Tube type: Oral Tube size: 7.0 mm Number of attempts: 1 Airway Equipment and Method: Stylet Placement Confirmation: ETT inserted through vocal cords under direct vision,  positive ETCO2 and breath sounds checked- equal and bilateral Secured at: 22 cm Tube secured with: Tape Dental Injury: Teeth and Oropharynx as per pre-operative assessment

## 2020-02-04 NOTE — Progress Notes (Signed)
PT Cancellation Note  Patient Details Name: RANELL FINELLI MRN: [id number] DOB: 04-Jan-1951   Cancelled Treatment:    Reason Eval/Treat Not Completed: Other (comment) Pt with active bedrest orders and plans for ORIF of L LE today. Will follow-up for PT eval post-op.   Wyona Almas, PT, DPT Acute Rehabilitation Services Pager [phone] Office [phone]    Deno Etienne 02/04/2020, 7:48 AM

## 2020-02-04 NOTE — Plan of Care (Signed)
  Problem: Education: Goal: Knowledge of General Education information will improve Description: Including pain rating scale, medication(s)/side effects and non-pharmacologic comfort measures Outcome: Progressing   Problem: Health Behavior/Discharge Planning: Goal: Ability to manage health-related needs will improve Outcome: Progressing   Problem: Clinical Measurements: Goal: Will remain free from infection Outcome: Progressing   Problem: Pain Managment: Goal: General experience of comfort will improve Outcome: Progressing   

## 2020-02-05 DIAGNOSIS — R29898 Other symptoms and signs involving the musculoskeletal system: Secondary | ICD-10-CM | POA: Diagnosis not present

## 2020-02-05 DIAGNOSIS — G4733 Obstructive sleep apnea (adult) (pediatric): Secondary | ICD-10-CM

## 2020-02-05 LAB — CBC
HCT: 27.7 % — ABNORMAL LOW (ref 36.0–46.0)
Hemoglobin: 8.9 g/dL — ABNORMAL LOW (ref 12.0–15.0)
MCH: 28.7 pg (ref 26.0–34.0)
MCHC: 32.1 g/dL (ref 30.0–36.0)
MCV: 89.4 fL (ref 80.0–100.0)
Platelets: 263 10*3/uL (ref 150–400)
RBC: 3.1 MIL/uL — ABNORMAL LOW (ref 3.87–5.11)
RDW: 15.4 % (ref 11.5–15.5)
WBC: 13.9 10*3/uL — ABNORMAL HIGH (ref 4.0–10.5)
nRBC: 0 % (ref 0.0–0.2)

## 2020-02-05 LAB — BASIC METABOLIC PANEL
Anion gap: 10 (ref 5–15)
BUN: 23 mg/dL (ref 8–23)
CO2: 28 mmol/L (ref 22–32)
Calcium: 8.7 mg/dL — ABNORMAL LOW (ref 8.9–10.3)
Chloride: 97 mmol/L — ABNORMAL LOW (ref 98–111)
Creatinine, Ser: 1.06 mg/dL — ABNORMAL HIGH (ref 0.44–1.00)
GFR, Estimated: 54 mL/min — ABNORMAL LOW (ref >60)
Glucose, Bld: 128 mg/dL — ABNORMAL HIGH (ref 70–99)
Potassium: 4.7 mmol/L (ref 3.5–5.1)
Sodium: 135 mmol/L (ref 135–145)

## 2020-02-05 MED ORDER — OXYCODONE HCL 5 MG PO TABS
10.0000 mg | ORAL_TABLET | ORAL | Status: DC | PRN
  Administered 2020-02-05 – 2020-02-09 (×8): 10 mg via ORAL
  Filled 2020-02-05 (×8): qty 2

## 2020-02-05 MED ORDER — CLONAZEPAM 0.5 MG PO TABS
0.5000 mg | ORAL_TABLET | Freq: Two times a day (BID) | ORAL | Status: DC
  Administered 2020-02-05 – 2020-02-10 (×10): 0.5 mg via ORAL
  Filled 2020-02-05 (×10): qty 1

## 2020-02-05 MED ORDER — ENOXAPARIN SODIUM 40 MG/0.4ML ~~LOC~~ SOLN
40.0000 mg | SUBCUTANEOUS | Status: DC
  Administered 2020-02-05 – 2020-02-09 (×6): 40 mg via SUBCUTANEOUS
  Filled 2020-02-05 (×6): qty 0.4

## 2020-02-05 MED ORDER — GABAPENTIN 600 MG PO TABS
600.0000 mg | ORAL_TABLET | Freq: Three times a day (TID) | ORAL | Status: DC
  Administered 2020-02-05 – 2020-02-10 (×14): 600 mg via ORAL
  Filled 2020-02-05 (×16): qty 1

## 2020-02-05 MED ORDER — VORTIOXETINE HBR 5 MG PO TABS
5.0000 mg | ORAL_TABLET | Freq: Every day | ORAL | Status: DC
  Administered 2020-02-06 – 2020-02-10 (×5): 5 mg via ORAL
  Filled 2020-02-05 (×5): qty 1

## 2020-02-05 MED ORDER — OXYCODONE HCL 5 MG PO TABS
5.0000 mg | ORAL_TABLET | ORAL | Status: DC | PRN
  Administered 2020-02-10: 5 mg via ORAL
  Filled 2020-02-05: qty 1

## 2020-02-05 MED ORDER — AMLODIPINE BESYLATE 10 MG PO TABS
10.0000 mg | ORAL_TABLET | Freq: Every day | ORAL | Status: DC
  Administered 2020-02-06 – 2020-02-10 (×4): 10 mg via ORAL
  Filled 2020-02-05 (×2): qty 1
  Filled 2020-02-05: qty 2
  Filled 2020-02-05 (×2): qty 1

## 2020-02-05 MED ORDER — CLONAZEPAM 1 MG PO TABS: 1.0000 mg | ORAL_TABLET | Freq: Two times a day (BID) | ORAL | Status: DC

## 2020-02-05 NOTE — Progress Notes (Signed)
SPORTS MEDICINE AND JOINT REPLACEMENT  Lara Mulch, MD    Carlyon Shadow, PA-C [place], [place], [place]  [address]                             [phone]   PROGRESS NOTE  Subjective:  negative for Chest Pain  negative for Shortness of Breath  negative for Nausea/Vomiting   negative for Calf Pain  negative for Bowel Movement   Tolerating Diet: yes         Patient reports pain as 4 on 0-10 scale.    Objective: Vital signs in last 24 hours:   Patient Vitals for the past 24 hrs:  BP Temp Temp src Pulse Resp SpO2  02/04/20 2023 (!) 123/48 98.2 F (36.8 C) Oral 84 17 94 %  02/04/20 1936 -- -- -- -- -- 95 %  02/04/20 1730 -- (!) 97 F (36.1 C) -- 65 10 93 %  02/04/20 1725 (!) 126/49 -- -- 64 (!) 8 100 %  02/04/20 1710 (!) 128/46 -- -- 65 10 100 %  02/04/20 1655 (!) 121/45 -- -- 67 14 100 %  02/04/20 1641 (!) 113/44 98.5 F (36.9 C) -- -- 13 100 %  02/04/20 0900 (!) 141/65 98.3 F (36.8 C) Oral 60 16 100 %    @flow {1959:LAST@   Intake/Output from previous day:   10/08 0701 - 10/09 0700 In: 850 [I.V.:850] Out: 1600 [Urine:1500]   Intake/Output this shift:   No intake/output data recorded.   Intake/Output      10/08 0701 - 10/09 0700 10/09 0701 - 10/10 0700   P.O.     I.V. 850    Total Intake 850    Urine 1500    Blood 100    Total Output 1600    Net -750            LABORATORY DATA: Recent Labs    01/30/20 0604 01/31/20 0324 02/01/20 0313 02/04/20 0612 02/05/20 0232  WBC 5.1 5.4 6.6 7.4 13.9*  HGB 8.9* 8.2* 9.0* 9.0* 8.9*  HCT 27.1* 25.5* 26.6* 29.2* 27.7*  PLT 169 163 178 208 263   Recent Labs    01/30/20 0604 01/31/20 0324 02/01/20 0313 02/02/20 0505 02/04/20 0612 02/05/20 0232  NA 139 139 137  --  139 135  K 4.2 4.4 4.0  --  4.6 4.7  CL 104 104 103  --  103 97*  CO2 24 28 26   --  28 28  BUN 35* 36* 41*  --  27* 23  CREATININE 1.23* 1.16* 1.15* 1.18* 1.13* 1.06*  GLUCOSE 163* 149* 136*  --  109* 128*  CALCIUM 9.4 8.9 9.0   --  8.9 8.7*   Lab Results  Component Value Date   INR 1.0 01/26/2020   INR 1.2 01/02/2020   INR 1.1 11/20/2019    Examination:  General appearance: alert, cooperative and no distress Extremities: extremities normal, atraumatic, no cyanosis or edema  Wound Exam: clean, dry, intact   Drainage:  None: wound tissue dry  Motor Exam: Quadriceps and Hamstrings Intact  Sensory Exam: Superficial Peroneal, Deep Peroneal and Tibial normal   Assessment:    1 Day Post-Op  Procedure(s) (LRB): OPEN REDUCTION INTERNAL FIXATION (ORIF) DISTAL FEMUR FRACTURE (Left)  ADDITIONAL DIAGNOSIS:  Principal Problem:   Closed fracture of distal end of left femur (HCC) Active Problems:   Major depressive disorder, recurrent, in partial remission (HCC)  Essential hypertension   Absence of bladder continence   Obstructive sleep apnea of adult   Weakness of left leg   Femur fracture, left (HCC)     Plan: Physical Therapy as ordered Non Weight Bearing (NWB)  Patient complains of no sleep last night but overall looking good from an ortho standpoint. Will continue to follow the weekend.   Donia Ast 02/05/2020, 7:02 AM

## 2020-02-05 NOTE — Evaluation (Signed)
Physical Therapy Evaluation Patient Details Name: [name] MRN: [id number] DOB: [birth year] Today's Date: 02/05/2020   History of Present Illness  69 year old with a history of HTN, COPD, OSA, MS, chronic neurogenic bladder with indwelling Foley catheter, depression, and follicular lymphoma who presented to the [facility] ED with weakness of the left lower extremity.  She was admitted with a presumed MS flare and treatment was initiated with high-dose Solu-Medrol.  Her treatment was completed and she was pending discharge while a SNF was being located.  On 10/6 she attempted to get out of bed alone and fell, after which she began to complain of severe left knee pain.  Plain film x-ray revealed a medial distal femur fracture on the left. Pt underwent ORIF 10/8  Clinical Impression  Pt presents with decreased functional mobility secondary to weakness, pain, balance deficits, weightbearing precautions, and decreased cognition. Pt requiring two person maximal assist for bed mobility; unable to clear hips for attempt to stand and requesting to lie back down. Recommending SNF upon discharge.    Follow Up Recommendations SNF    Equipment Recommendations  None recommended by PT    Recommendations for Other Services       Precautions / Restrictions Precautions Precautions: Fall Restrictions Weight Bearing Restrictions: Yes LLE Weight Bearing: Non weight bearing      Mobility  Bed Mobility Overal bed mobility: Needs Assistance Bed Mobility: Supine to Sit;Sit to Supine     Supine to sit: Max assist;+2 for physical assistance;+2 for safety/equipment;HOB elevated Sit to supine: Max assist;+2 for physical assistance;+2 for safety/equipment;HOB elevated   General bed mobility comments: Max A x 2 for all bed mobility, assistance to advance L LE and heavy assist for trunk. Max A x 2 for scooting up in bed.   Transfers Overall transfer level: Needs assistance Equipment used: Rolling walker (2  wheeled) Transfers: Sit to/from Stand           General transfer comment: Unable to complete full sit to stand today, difficulty clearing hips from bed and minimal muscle activation on attempts  Ambulation/Gait                Stairs            Wheelchair Mobility    Modified Rankin (Stroke Patients Only)       Balance Overall balance assessment: Needs assistance Sitting-balance support: Feet supported;Bilateral upper extremity supported Sitting balance-Leahy Scale: Fair Sitting balance - Comments: fair static sitting, no LOB       Standing balance comment: unable                             Pertinent Vitals/Pain Pain Assessment: Faces Faces Pain Scale: Hurts little more Pain Location: left leg  Pain Descriptors / Indicators: Discomfort;Grimacing Pain Intervention(s): Limited activity within patient's tolerance;Monitored during session;Repositioned    Home Living Family/patient expects to be discharged to:: Private residence Living Arrangements: Spouse/significant other Available Help at Discharge: Family;Available PRN/intermittently;Friend(s) Type of Home: House Home Access: Stairs to enter;Ramped entrance Entrance Stairs-Rails: None Entrance Stairs-Number of Steps: has a ramp, and then up two more steps (has a grab handle on the L) Home Layout: One level Home Equipment: Wheelchair - manual;Walker - 4 wheels;Grab bars - tub/shower;Other (comment);Shower seat;Bedside commode (lift chair) Additional Comments: Pt reports husband not much help at home. Has a friend that helps with bill management, but unable to help physically    Prior Function Level of  Independence: Needs assistance   Gait / Transfers Assistance Needed: rollator or WC for household mobility; sleepsin lift chair  ADL's / Homemaking Assistance Needed: Husbands assists with all IADLs and some ADLs including bathing (drying off) and some dressing (bra and socks)        Hand  Dominance   Dominant Hand: Right    Extremity/Trunk Assessment   Upper Extremity Assessment Upper Extremity Assessment: Generalized weakness    Lower Extremity Assessment Lower Extremity Assessment: Generalized weakness;LLE deficits/detail LLE Deficits / Details: L femur fx s/p ORIF. Ankle dorsiflexion 3/5, hip/knee grossly 2/5    Cervical / Trunk Assessment Cervical / Trunk Assessment: Kyphotic  Communication   Communication: No difficulties  Cognition Arousal/Alertness: Awake/alert Behavior During Therapy: Impulsive;Agitated Overall Cognitive Status: Impaired/Different from baseline Area of Impairment: Safety/judgement;Awareness;Problem solving                         Safety/Judgement: Decreased awareness of safety;Decreased awareness of deficits Awareness: Emergent Problem Solving: Requires verbal cues;Difficulty sequencing;Requires tactile cues General Comments: Pt impulsive, becoming quickly agitated during initial bed mobility attempt yelling "I cant use that little walker!" then quickly throwing self back on bed. With increased time for transitions, etc, pt able to attempt bed mobility again. Agitated when asked to try standing with RW. Self limiting and decreased safety awareness      General Comments General comments (skin integrity, edema, etc.): Pt with reports of nausea during session and feeling hot - used fan to calm and cool pt with some success during activity attempts. Pt overall unhappy with current situation, minimal support at home, etc.     Exercises     Assessment/Plan    PT Assessment Patient needs continued PT services  PT Problem List Decreased strength;Decreased range of motion;Decreased activity tolerance;Decreased balance;Decreased safety awareness;Obesity;Decreased mobility;Decreased knowledge of precautions       PT Treatment Interventions DME instruction;Balance training;Gait training;Stair training;Neuromuscular  re-education;Cognitive remediation;Functional mobility training;Patient/family education;Therapeutic activities;Therapeutic exercise    PT Goals (Current goals can be found in the Care Plan section)  Acute Rehab PT Goals Patient Stated Goal: get back in bed PT Goal Formulation: With patient Time For Goal Achievement: 02/19/20 Potential to Achieve Goals: Fair    Frequency Min 2X/week   Barriers to discharge Inaccessible home environment;Decreased caregiver support      Co-evaluation PT/OT/SLP Co-Evaluation/Treatment: Yes Reason for Co-Treatment: Necessary to address cognition/behavior during functional activity;Complexity of the patient's impairments (multi-system involvement);For patient/therapist safety;To address functional/ADL transfers PT goals addressed during session: Mobility/safety with mobility OT goals addressed during session: ADL's and self-care;Other (comment) (transfers)       AM-PAC PT "6 Clicks" Mobility  Outcome Measure Help needed turning from your back to your side while in a flat bed without using bedrails?: A Lot Help needed moving from lying on your back to sitting on the side of a flat bed without using bedrails?: Total Help needed moving to and from a bed to a chair (including a wheelchair)?: Total Help needed standing up from a chair using your arms (e.g., wheelchair or bedside chair)?: Total Help needed to walk in hospital room?: Total Help needed climbing 3-5 steps with a railing? : Total 6 Click Score: 7    End of Session   Activity Tolerance: Treatment limited secondary to agitation Patient left: in bed;with call bell/phone within reach;with bed alarm set Nurse Communication: Mobility status PT Visit Diagnosis: Difficulty in walking, not elsewhere classified (R26.2);Muscle weakness (generalized) (M62.81);Other abnormalities of gait  and mobility (R26.89)    Time: 7096-2836 PT Time Calculation (min) (ACUTE ONLY): 25 min   Charges:   PT  Evaluation $PT Eval Moderate Complexity: 1 Mod          Wyona Almas, PT, DPT Acute Rehabilitation Services Pager [phone] Office [phone]   Deno Etienne 02/05/2020, 11:14 AM

## 2020-02-05 NOTE — Progress Notes (Signed)
[name]  BOF:[id number] DOB: 03/21/1951 DOA: 02/04/2020 PCP: Kirk Ruths, MD    Brief Narrative:  [phone] with a history of HTN, COPD, OSA, MS, chronic neurogenic bladder with indwelling Foley catheter, depression, and follicular lymphoma who presented to the [facility] ED with weakness of the left lower extremity.  She was admitted with a presumed MS flare and treatment was initiated with high-dose Solu-Medrol.  Her treatment was completed and she was pending discharge while a SNF was being located.  On 10/6 she attempted to get out of bed alone, after which she began to complain of severe left knee pain.  It is not entirely clear to me if she fell or not.  Plain film x-ray revealed a medial distal femur fracture on the left.  Given the complex nature of her fracture the patient was transferred to [facility] for surgical correction.  Significant Events:  10/8 transfer to [facility] 10/8 ORIF left medial condyle fracture -ORIF left femoral shaft fracture  Antimicrobials:  None  DVT prophylaxis: lovenox   Subjective: Vital signs stable.  Afebrile. Reports poorly controlled pain in her leg, w/ pain meds working but wearing off before next dose available. Denies cp, n/v, abdom pain.   Assessment & Plan:  Mildly displaced comminuted spiral fracture distal left femur extending into the intercondylar notch Care per Orthopedic Surgery - status post surgical correction 10/8 - PT/OT working w/ pt   Multiple sclerosis recently completed 5 days of high-dose Solu-Medrol under the care of neurology - appears to be in remission at present  Acute kidney injury on CKD stage IIIa Resolved with volume resuscitation at [facility]  HTN Usual lisinopril on hold due to recent acute kidney injury -continue other usual home meds  Chronic neurogenic bladder Chronic indwelling Foley remains  Obstructive sleep apnea Patient does not use her CPAP at home  Abnormal UA Present at time of admission to [facility]  -not likely UTI as patient was asymptomatic and has a chronic indwelling Foley - was treated with antibiotic at [facility] nonetheless  Severe debility -generalized weakness PT/OT recommended SNF but family was unable to afford the co-pay  Code Status: FULL CODE Family Communication:  Status is: Inpatient  Remains inpatient appropriate because:Inpatient level of care appropriate due to severity of illness   Dispo: The patient is from: Home              Anticipated d/c is to: SNF              Anticipated d/c date is: 2 days              Patient currently is not medically stable to d/c.   Consultants:  Ortho   Objective: Blood pressure (!) 123/48, pulse 84, temperature 98.2 F (36.8 C), temperature source Oral, resp. rate 16, SpO2 97 %.  Intake/Output Summary (Last 24 hours) at 02/05/2020 0916 Last data filed at 02/05/2020 0700 Gross per 24 hour  Intake 1330 ml  Output 2200 ml  Net -870 ml   There were no vitals filed for this visit.  Examination: General: No acute respiratory distress Lungs: Clear to auscultation bilaterally without wheezes or crackles Cardiovascular: Regular rate and rhythm without murmur Abdomen: Nontender, nondistended, soft, bowel sounds positive, no rebound, no ascites, no appreciable mass Extremities: No significant edema bilateral lower extremities   CBC: Recent Labs  Lab 02/01/20 0313 02/04/20 0612 02/05/20 0232  WBC 6.6 7.4 13.9*  HGB 9.0* 9.0* 8.9*  HCT 26.6* 29.2* 27.7*  MCV 84.2 89.3 89.4  PLT 178 208 532   Basic Metabolic Panel: Recent Labs  Lab 02/01/20 0313 02/01/20 0313 02/02/20 0505 02/04/20 0612 02/05/20 0232  NA 137  --   --  139 135  K 4.0  --   --  4.6 4.7  CL 103  --   --  103 97*  CO2 26  --   --  28 28  GLUCOSE 136*  --   --  109* 128*  BUN 41*  --   --  27* 23  CREATININE 1.15*   < > 1.18* 1.13* 1.06*  CALCIUM 9.0  --   --  8.9 8.7*   < > = values in this interval not displayed.   GFR: Estimated Creatinine  Clearance: 62.2 mL/min (A) (by C-G formula based on SCr of 1.06 mg/dL (H)).  Liver Function Tests: No results for input(s): AST, ALT, ALKPHOS, BILITOT, PROT, ALBUMIN in the last 168 hours. No results for input(s): LIPASE, AMYLASE in the last 168 hours. No results for input(s): AMMONIA in the last 168 hours.  Coagulation Profile: No results for input(s): INR, PROTIME in the last 168 hours.  Cardiac Enzymes: No results for input(s): CKTOTAL, CKMB, CKMBINDEX, TROPONINI in the last 168 hours.  HbA1C: Hgb A1c MFr Bld  Date/Time Value Ref Range Status  01/26/2020 03:03 PM 5.4 4.8 - 5.6 % Final    Comment:    (NOTE) Pre diabetes:          5.7%-6.4%  Diabetes:              >6.4%  Glycemic control for   <7.0% adults with diabetes   06/20/2019 06:27 PM 5.0 4.8 - 5.6 % Final    Comment:    (NOTE) Pre diabetes:          5.7%-6.4% Diabetes:              >6.4% Glycemic control for   <7.0% adults with diabetes     CBG: Recent Labs  Lab 02/02/20 2127 02/03/20 0721 02/03/20 1114 02/03/20 1640 02/03/20 2053  GLUCAP 111* 80 126* 112* 141*    Recent Results (from the past 240 hour(s))  Blood Culture (routine x 2)     Status: None   Collection Time: 01/26/20  3:04 PM   Specimen: BLOOD  Result Value Ref Range Status   Specimen Description BLOOD RIGHT ANTECUBITAL  Final   Special Requests   Final    BOTTLES DRAWN AEROBIC AND ANAEROBIC Blood Culture adequate volume   Culture   Final    NO GROWTH 5 DAYS Performed at [facility], [address], [place], [place] [address]    Report Status 01/31/2020 FINAL  Final  Blood Culture (routine x 2)     Status: None   Collection Time: 01/26/20  3:04 PM   Specimen: BLOOD  Result Value Ref Range Status   Specimen Description BLOOD RIGHT ANTECUBITAL  Final   Special Requests   Final    BOTTLES DRAWN AEROBIC AND ANAEROBIC Blood Culture adequate volume   Culture   Final    NO GROWTH 5 DAYS Performed at [facility],  [address], [place], [place] [address]    Report Status 01/31/2020 FINAL  Final  Respiratory Panel by RT PCR (Flu A&B, Covid) - Nasopharyngeal Swab     Status: None   Collection Time: 01/26/20  5:36 PM   Specimen: Nasopharyngeal Swab  Result Value Ref Range Status   SARS  Coronavirus 2 by RT PCR NEGATIVE NEGATIVE Final    Comment: (NOTE) SARS-CoV-2 target nucleic acids are NOT DETECTED.  The SARS-CoV-2 RNA is generally detectable in upper respiratoy specimens during the acute phase of infection. The lowest concentration of SARS-CoV-2 viral copies this assay can detect is 131 copies/mL. A negative result does not preclude SARS-Cov-2 infection and should not be used as the sole basis for treatment or other patient management decisions. A negative result may occur with  improper specimen collection/handling, submission of specimen other than nasopharyngeal swab, presence of viral mutation(s) within the areas targeted by this assay, and inadequate number of viral copies (<131 copies/mL). A negative result must be combined with clinical observations, patient history, and epidemiological information. The expected result is Negative.  Fact Sheet for Patients:  PinkCheek.be  Fact Sheet for Healthcare Providers:  GravelBags.it  This test is no t yet approved or cleared by the [place] FDA and  has been authorized for detection and/or diagnosis of SARS-CoV-2 by FDA under an Emergency Use Authorization (EUA). This EUA will remain  in effect (meaning this test can be used) for the duration of the COVID-19 declaration under Section 564(b)(1) of the Act, 21 U.S.C. section 360bbb-3(b)(1), unless the authorization is terminated or revoked sooner.     Influenza A by PCR NEGATIVE NEGATIVE Final   Influenza B by PCR NEGATIVE NEGATIVE Final    Comment: (NOTE) The Xpert Xpress SARS-CoV-2/FLU/RSV assay is intended as an aid in   the diagnosis of influenza from Nasopharyngeal swab specimens and  should not be used as a sole basis for treatment. Nasal washings and  aspirates are unacceptable for Xpert Xpress SARS-CoV-2/FLU/RSV  testing.  Fact Sheet for Patients: PinkCheek.be  Fact Sheet for Healthcare Providers: GravelBags.it  This test is not yet approved or cleared by the [place] FDA and  has been authorized for detection and/or diagnosis of SARS-CoV-2 by  FDA under an Emergency Use Authorization (EUA). This EUA will remain  in effect (meaning this test can be used) for the duration of the  Covid-19 declaration under Section 564(b)(1) of the Act, 21  U.S.C. section 360bbb-3(b)(1), unless the authorization is  terminated or revoked. Performed at [facility], [address], [place], [place] [address]   Urine culture     Status: Abnormal   Collection Time: 01/26/20  8:02 PM   Specimen: In/Out Cath Urine  Result Value Ref Range Status   Specimen Description   Final    IN/OUT CATH URINE Performed at [facility], [address], [place], [place] [address]    Special Requests   Final    NONE Performed at [facility], [address] [place], [place] [address]    Culture MULTIPLE SPECIES PRESENT, SUGGEST RECOLLECTION (A)  Final   Report Status 01/27/2020 FINAL  Final  Surgical pcr screen     Status: Abnormal   Collection Time: 02/04/20 12:20 PM   Specimen: Nasal Mucosa; Nasal Swab  Result Value Ref Range Status   MRSA, PCR POSITIVE (A) NEGATIVE Final    Comment: RESULT CALLED TO, READ BACK BY AND VERIFIED WITH: Eulogio Ditch RN 14:35 02/04/20 ([email or web address])    Staphylococcus aureus POSITIVE (A) NEGATIVE Final    Comment: (NOTE) The Xpert SA Assay (FDA approved for NASAL specimens in patients 78 years of age and older), is one component of a comprehensive surveillance program. It is not intended to diagnose infection  nor to guide or monitor treatment. Performed at [facility], [place] [address], [place], [place] [address]      Scheduled Meds: . acetaminophen  1,000 mg Oral Q8H  . amLODipine  5 mg Oral Daily  . atorvastatin  10 mg Oral QHS  . baclofen  10 mg Oral TID  . Chlorhexidine Gluconate Cloth  6 each Topical Q0600  . Chlorhexidine Gluconate Cloth  6 each Topical Q0600  . DULoxetine  30 mg Oral Daily  . hydrALAZINE  50 mg Oral Q8H  . mometasone-formoterol  2 puff Inhalation BID  . mupirocin ointment  1 application Nasal BID  . Teriflunomide  14 mg Oral Daily   Continuous Infusions: .  ceFAZolin (ANCEF) IV 2 g (02/05/20 0534)  . lactated ringers 75 mL/hr at 02/04/20 1803  . methocarbamol (ROBAXIN) IV       LOS: 1 day   Cherene Altes, MD [facility] Office  [phone] Pager - Text Page per Amion  If 7PM-7AM, please contact night-coverage per Amion 02/05/2020, 9:16 AM

## 2020-02-05 NOTE — Evaluation (Signed)
Occupational Therapy Evaluation Patient Details Name: [name] MRN: [id number] DOB: 05-04-1950 Today's Date: 02/05/2020    History of Present Illness 69 year old with a history of HTN, COPD, OSA, MS, chronic neurogenic bladder with indwelling Foley catheter, depression, and follicular lymphoma who presented to the [facility] ED with weakness of the left lower extremity.  She was admitted with a presumed MS flare and treatment was initiated with high-dose Solu-Medrol.  Her treatment was completed and she was pending discharge while a SNF was being located.  On 10/6 she attempted to get out of bed alone and fell, after which she began to complain of severe left knee pain.  Plain film x-ray revealed a medial distal femur fracture on the left. Pt underwent ORIF 10/8   Clinical Impression   PTA, pt reports being at [facility] for short term rehab. Prior to this, pt lives at home with husband and receives light assistance for ADLs. Pt able to ambulate short household distances with Rollator and otherwise, sedentary. Pt presents now s/p surgery with deficits in strength, endurance, pain, balance, and safety awareness. Pt with intermittent agitation and impulsivity throughout session. Pt overall Max A x 2 for bed mobility, unable to clear hips from bed for sit to stand attempts today. Pt able to verbalize NWB L LE precautions without cues. Pt requires Min A for UB ADLs and Total A for LB ADLs due to deficits. Based on current functional abilities, recommend SNF for short term rehab.     Follow Up Recommendations  SNF;Supervision/Assistance - 24 hour    Equipment Recommendations  None recommended by OT (appears to be well equipped at home; TBD)    Recommendations for Other Services       Precautions / Restrictions Precautions Precautions: Fall Restrictions Weight Bearing Restrictions: Yes LLE Weight Bearing: Non weight bearing      Mobility Bed Mobility Overal bed mobility: Needs Assistance Bed  Mobility: Supine to Sit;Sit to Supine     Supine to sit: Max assist;+2 for physical assistance;+2 for safety/equipment;HOB elevated Sit to supine: Max assist;+2 for physical assistance;+2 for safety/equipment;HOB elevated   General bed mobility comments: Max A x 2 for all bed mobility, assistance to advance L LE and heavy assist for trunk. Max A x 2 for scooting up in bed.   Transfers Overall transfer level: Needs assistance Equipment used: Rolling walker (2 wheeled) Transfers: Sit to/from Stand           General transfer comment: Unable to complete full sit to stand today, difficulty clearing hips from bed and minimal muscle activation on attempts    Balance Overall balance assessment: Needs assistance Sitting-balance support: Feet supported;Bilateral upper extremity supported Sitting balance-Leahy Scale: Fair Sitting balance - Comments: fair static sitting, no LOB       Standing balance comment: unable                           ADL either performed or assessed with clinical judgement   ADL Overall ADL's : Needs assistance/impaired Eating/Feeding: Set up;Sitting   Grooming: Set up;Sitting   Upper Body Bathing: Minimal assistance;Sitting   Lower Body Bathing: Sitting/lateral leans;Bed level;Total assistance   Upper Body Dressing : Minimal assistance;Sitting   Lower Body Dressing: Total assistance;Bed level;Sit to/from stand;Sitting/lateral leans Lower Body Dressing Details (indicate cue type and reason): Total A to don L sock, reports husband assists with this at home     Crane and Hygiene: Total assistance;Bed  level         General ADL Comments: Pt with self limiting behaviors, pain and weakness in ability to complete ADLs with NWB LE precautions     Vision Baseline Vision/History: Wears glasses Wears Glasses: Reading only Patient Visual Report: No change from baseline;Blurring of vision Vision Assessment?: Vision  impaired- to be further tested in functional context Additional Comments: blurring of vision due to MS, reports same as baseline     Perception     Praxis      Pertinent Vitals/Pain Pain Assessment: Faces Faces Pain Scale: Hurts little more Pain Location: left leg  Pain Descriptors / Indicators: Discomfort;Grimacing Pain Intervention(s): Limited activity within patient's tolerance;Monitored during session;Repositioned     Hand Dominance Right   Extremity/Trunk Assessment Upper Extremity Assessment Upper Extremity Assessment: Generalized weakness   Lower Extremity Assessment Lower Extremity Assessment: Defer to PT evaluation   Cervical / Trunk Assessment Cervical / Trunk Assessment: Kyphotic   Communication Communication Communication: No difficulties   Cognition Arousal/Alertness: Awake/alert Behavior During Therapy: Impulsive;Agitated Overall Cognitive Status: Impaired/Different from baseline Area of Impairment: Safety/judgement;Awareness;Problem solving                         Safety/Judgement: Decreased awareness of safety;Decreased awareness of deficits Awareness: Emergent Problem Solving: Requires verbal cues;Difficulty sequencing;Requires tactile cues General Comments: Pt impulsive, becoming quickly agitated during initial bed mobility attempt yelling "I cant use that little walker!" then quickly throwing self back on bed. With increased time for transitions, etc, pt able to attempt bed mobility again. Agitated when asked to try standing with RW. Self limiting and decreased safety awareness   General Comments  Pt with reports of nausea during session and feeling hot - used fan to calm and cool pt with some success during activity attempts. Pt overall unhappy with current situation, minimal support at home, etc.     Exercises     Shoulder Instructions      Home Living Family/patient expects to be discharged to:: Private residence Living Arrangements:  Spouse/significant other Available Help at Discharge: Family;Available PRN/intermittently;Friend(s) Type of Home: House Home Access: Stairs to enter;Ramped entrance Entrance Stairs-Number of Steps: has a ramp, and then up two more steps (has a grab handle on the L) Entrance Stairs-Rails: None Home Layout: One level     Bathroom Shower/Tub: Occupational psychologist: Handicapped height (BSC over toilet) Bathroom Accessibility: Yes How Accessible: Accessible via walker Home Equipment: Wheelchair - manual;Walker - 4 wheels;Grab bars - tub/shower;Other (comment);Shower seat;Bedside commode (lift chair)   Additional Comments: Pt reports husband not much help at home. Has a friend that helps with bill management, but unable to help physically      Prior Functioning/Environment Level of Independence: Needs assistance  Gait / Transfers Assistance Needed: rollator or WC for household mobility; sleepsin lift chair ADL's / Homemaking Assistance Needed: Husbands assists with all IADLs and some ADLs including bathing (drying off) and some dressing (bra and socks)            OT Problem List: Decreased strength;Decreased activity tolerance;Decreased safety awareness;Decreased knowledge of use of DME or AE;Impaired balance (sitting and/or standing);Decreased knowledge of precautions;Decreased cognition;Pain      OT Treatment/Interventions: Self-care/ADL training;Therapeutic exercise;Therapeutic activities;Patient/family education;Balance training;DME and/or AE instruction;Energy conservation    OT Goals(Current goals can be found in the care plan section) Acute Rehab OT Goals Patient Stated Goal: get back in bed OT Goal Formulation: With patient Time For Goal Achievement:  02/19/20 Potential to Achieve Goals: Good ADL Goals Pt Will Perform Lower Body Bathing: with mod assist;sitting/lateral leans Pt Will Transfer to Toilet: with max assist;with +2 assist;bedside commode Pt Will  Perform Toileting - Clothing Manipulation and hygiene: with mod assist;sitting/lateral leans Pt/caregiver will Perform Home Exercise Program: Increased strength;Both right and left upper extremity;With theraband;With Supervision;With written HEP provided Additional ADL Goal #1: Pt to demonstrate ability to sit EOB >5 minutes with Good balance in preparation for ADL transfers  OT Frequency: Min 2X/week   Barriers to D/C: Inaccessible home environment;Decreased caregiver support          Co-evaluation PT/OT/SLP Co-Evaluation/Treatment: Yes Reason for Co-Treatment: Necessary to address cognition/behavior during functional activity;For patient/therapist safety;To address functional/ADL transfers   OT goals addressed during session: ADL's and self-care;Other (comment) (transfers)      AM-PAC OT "6 Clicks" Daily Activity     Outcome Measure Help from another person eating meals?: A Little Help from another person taking care of personal grooming?: A Little Help from another person toileting, which includes using toliet, bedpan, or urinal?: Total Help from another person bathing (including washing, rinsing, drying)?: Total Help from another person to put on and taking off regular upper body clothing?: A Little Help from another person to put on and taking off regular lower body clothing?: Total 6 Click Score: 12   End of Session Equipment Utilized During Treatment: Gait belt;Rolling walker Nurse Communication: Mobility status  Activity Tolerance: Patient limited by fatigue;Treatment limited secondary to agitation Patient left: in bed;with call bell/phone within reach;with bed alarm set  OT Visit Diagnosis: Unsteadiness on feet (R26.81);Repeated falls (R29.6);History of falling (Z91.81);Muscle weakness (generalized) (M62.81);Pain Pain - Right/Left: Left Pain - part of body: Knee;Leg                Time: 5909-3112 OT Time Calculation (min): 24 min Charges:  OT General Charges $OT  Visit: 1 Visit OT Evaluation $OT Eval Moderate Complexity: 1 Mod  Layla Maw, OTR/L  Layla Maw 02/05/2020, 9:54 AM

## 2020-02-06 ENCOUNTER — Encounter (HOSPITAL_COMMUNITY): Payer: Self-pay | Admitting: Internal Medicine

## 2020-02-06 DIAGNOSIS — G4733 Obstructive sleep apnea (adult) (pediatric): Secondary | ICD-10-CM | POA: Diagnosis not present

## 2020-02-06 DIAGNOSIS — R29898 Other symptoms and signs involving the musculoskeletal system: Secondary | ICD-10-CM | POA: Diagnosis not present

## 2020-02-06 LAB — BASIC METABOLIC PANEL
Anion gap: 9 (ref 5–15)
BUN: 21 mg/dL (ref 8–23)
CO2: 27 mmol/L (ref 22–32)
Calcium: 8.7 mg/dL — ABNORMAL LOW (ref 8.9–10.3)
Chloride: 101 mmol/L (ref 98–111)
Creatinine, Ser: 1.04 mg/dL — ABNORMAL HIGH (ref 0.44–1.00)
GFR, Estimated: 55 mL/min — ABNORMAL LOW (ref >60)
Glucose, Bld: 111 mg/dL — ABNORMAL HIGH (ref 70–99)
Potassium: 4.3 mmol/L (ref 3.5–5.1)
Sodium: 137 mmol/L (ref 135–145)

## 2020-02-06 LAB — CBC
HCT: 25.3 % — ABNORMAL LOW (ref 36.0–46.0)
Hemoglobin: 8 g/dL — ABNORMAL LOW (ref 12.0–15.0)
MCH: 28.1 pg (ref 26.0–34.0)
MCHC: 31.6 g/dL (ref 30.0–36.0)
MCV: 88.8 fL (ref 80.0–100.0)
Platelets: 216 10*3/uL (ref 150–400)
RBC: 2.85 MIL/uL — ABNORMAL LOW (ref 3.87–5.11)
RDW: 15.7 % — ABNORMAL HIGH (ref 11.5–15.5)
WBC: 10 10*3/uL (ref 4.0–10.5)
nRBC: 0 % (ref 0.0–0.2)

## 2020-02-06 NOTE — Progress Notes (Signed)
[name]  QVZ:[id number] DOB: 08-16-1950 DOA: 02/04/2020 PCP: Kirk Ruths, MD    Brief Narrative:  [phone] with a history of HTN, COPD, OSA, MS, chronic neurogenic bladder with indwelling Foley catheter, depression, and follicular lymphoma who presented to the [facility] ED with weakness of the left lower extremity.  She was admitted with a presumed MS flare and treatment was initiated with high-dose Solu-Medrol.  Her treatment was completed and she was pending discharge while a SNF was being located.  On 10/6 she attempted to get out of bed alone, after which she began to complain of severe left knee pain.  It is not entirely clear to me if she fell or not.  Plain film x-ray revealed a medial distal femur fracture on the left.  Given the complex nature of her fracture the patient was transferred to [facility] for surgical correction.  Significant Events:  10/8 transfer to [facility] 10/8 ORIF left medial condyle fracture -ORIF left femoral shaft fracture  Antimicrobials:  None  DVT prophylaxis: lovenox   Subjective: Afebrile.  Vital signs stable.  Kidney function holding stable.  Hemoglobin has drifted down gently but is not yet below the transfusion threshold.  Assessment & Plan:  Mildly displaced comminuted spiral fracture distal left femur extending into the intercondylar notch Care per Orthopedic Surgery - status post surgical correction 10/8 - PT/OT working w/ pt   Multiple sclerosis recently completed 5 days of high-dose Solu-Medrol under the care of Neurology - appears to be in remission at present  Acute kidney injury on CKD stage IIIa Resolved with volume resuscitation at [facility]  HTN Usual lisinopril on hold due to recent acute kidney injury -continue other usual home meds  Chronic neurogenic bladder Chronic indwelling Foley remains  Obstructive sleep apnea Patient does not use her CPAP at home  Abnormal UA Present at time of admission to [facility] -not likely UTI as  patient was asymptomatic and has a chronic indwelling Foley - was treated with antibiotic at [facility] nonetheless  Severe debility -generalized weakness PT/OT recommended SNF but family was unable to afford the co-pay  Code Status: FULL CODE Family Communication:  Status is: Inpatient  Remains inpatient appropriate because:Inpatient level of care appropriate due to severity of illness   Dispo: The patient is from: Home              Anticipated d/c is to: SNF              Anticipated d/c date is: 2 days              Patient currently is not medically stable to d/c.   Consultants:  Ortho   Objective: Blood pressure 140/67, pulse 65, temperature 98.4 F (36.9 C), temperature source Oral, resp. rate 19, SpO2 96 %.  Intake/Output Summary (Last 24 hours) at 02/06/2020 0938 Last data filed at 02/06/2020 0900 Gross per 24 hour  Intake 1120 ml  Output 3950 ml  Net -2830 ml   There were no vitals filed for this visit.  Examination: General: No acute respiratory distress Lungs: Clear to auscultation B Cardiovascular: RRR Abdomen: Obese, NT/ND, soft, bs+ Extremities: trace B LE edema    CBC: Recent Labs  Lab 02/04/20 0612 02/05/20 0232 02/06/20 0036  WBC 7.4 13.9* 10.0  HGB 9.0* 8.9* 8.0*  HCT 29.2* 27.7* 25.3*  MCV 89.3 89.4 88.8  PLT 208 263 188   Basic Metabolic Panel: Recent Labs  Lab 02/04/20 0612 02/05/20 0232 02/06/20 0036  NA 139 135 137  K 4.6 4.7 4.3  CL 103 97* 101  CO2 28 28 27   GLUCOSE 109* 128* 111*  BUN 27* 23 21  CREATININE 1.13* 1.06* 1.04*  CALCIUM 8.9 8.7* 8.7*   GFR: Estimated Creatinine Clearance: 63.3 mL/min (A) (by C-G formula based on SCr of 1.04 mg/dL (H)).  Liver Function Tests: No results for input(s): AST, ALT, ALKPHOS, BILITOT, PROT, ALBUMIN in the last 168 hours. No results for input(s): LIPASE, AMYLASE in the last 168 hours. No results for input(s): AMMONIA in the last 168 hours.  Coagulation Profile: No results for  input(s): INR, PROTIME in the last 168 hours.  Cardiac Enzymes: No results for input(s): CKTOTAL, CKMB, CKMBINDEX, TROPONINI in the last 168 hours.  HbA1C: Hgb A1c MFr Bld  Date/Time Value Ref Range Status  01/26/2020 03:03 PM 5.4 4.8 - 5.6 % Final    Comment:    (NOTE) Pre diabetes:          5.7%-6.4%  Diabetes:              >6.4%  Glycemic control for   <7.0% adults with diabetes   06/20/2019 06:27 PM 5.0 4.8 - 5.6 % Final    Comment:    (NOTE) Pre diabetes:          5.7%-6.4% Diabetes:              >6.4% Glycemic control for   <7.0% adults with diabetes     CBG: Recent Labs  Lab 02/02/20 2127 02/03/20 0721 02/03/20 1114 02/03/20 1640 02/03/20 2053  GLUCAP 111* 80 126* 112* 141*    Recent Results (from the past 240 hour(s))  Surgical pcr screen     Status: Abnormal   Collection Time: 02/04/20 12:20 PM   Specimen: Nasal Mucosa; Nasal Swab  Result Value Ref Range Status   MRSA, PCR POSITIVE (A) NEGATIVE Final    Comment: RESULT CALLED TO, READ BACK BY AND VERIFIED WITH: Eulogio Ditch RN 14:35 02/04/20 ([email or web address])    Staphylococcus aureus POSITIVE (A) NEGATIVE Final    Comment: (NOTE) The Xpert SA Assay (FDA approved for NASAL specimens in patients 69 years of age and older), is one component of a comprehensive surveillance program. It is not intended to diagnose infection nor to guide or monitor treatment. Performed at [facility], [place] [address], [place], [place] [address]      Scheduled Meds: . acetaminophen  1,000 mg Oral Q8H  . amLODipine  10 mg Oral Daily  . atorvastatin  10 mg Oral QHS  . baclofen  10 mg Oral TID  . Chlorhexidine Gluconate Cloth  6 each Topical Q0600  . clonazePAM  0.5 mg Oral BID  . DULoxetine  30 mg Oral Daily  . enoxaparin (LOVENOX) injection  40 mg Subcutaneous Q24H  . gabapentin  600 mg Oral TID  . hydrALAZINE  50 mg Oral Q8H  . mometasone-formoterol  2 puff Inhalation BID  . mupirocin ointment  1 application Nasal BID   . vortioxetine HBr  5 mg Oral Daily   Continuous Infusions: . methocarbamol (ROBAXIN) IV       LOS: 2 days   Cherene Altes, MD [facility] Office  [phone] Pager - Text Page per Amion  If 7PM-7AM, please contact night-coverage per Amion 02/06/2020, 9:38 AM

## 2020-02-06 NOTE — Plan of Care (Signed)
  Problem: Activity: Goal: Ability to avoid complications of mobility impairment will improve Outcome: Progressing Goal: Ability to tolerate increased activity will improve Outcome: Progressing   Problem: Education: Goal: Verbalization of understanding the information provided will improve Outcome: Progressing   Problem: Coping: Goal: Level of anxiety will decrease Outcome: Progressing

## 2020-02-06 NOTE — NC FL2 (Signed)
[place] LEVEL OF CARE SCREENING TOOL     IDENTIFICATION  Patient Name: [name] Birthdate: [birth year] Sex: female Admission Date (Current Location): 02/04/2020  [facility] and [place] Number:  Herbalist and Address:  [facility]. [facility], [place] [address], [place], [place] [address]      Provider Number: [id number]  Attending Physician Name and Address:  Cherene Altes, MD  Relative Name and Phone Number:  Geronimo Boot Daughter   [phone]    Current Level of Care:   Recommended Level of Care:   Prior Approval Number:    Date Approved/Denied:   PASRR Number:    Discharge Plan:      Current Diagnoses: Patient Active Problem List   Diagnosis Date Noted  . Closed fracture of distal end of left femur ([place]) 02/04/2020  . Femur fracture, left ([place]) 02/04/2020  . Weakness of left leg 01/26/2020  . Left leg weakness 01/26/2020  . COPD (chronic obstructive pulmonary disease) ([place]) 01/01/2020  . Chronic indwelling Foley catheter 11/19/2019  . Neuropathy   . Hypokalemia   . Hyperlipidemia   . Sepsis secondary to UTI ([place]) 10/17/2019  . Weakness   . AKI (acute kidney injury) ([place]) 04/20/2019  . Pressure injury of skin 02/03/2019  . Bilateral lower leg cellulitis 02/02/2019  . Ovarian mass, left 01/27/2019  . Sepsis ([place]) 01/05/2019  . UTI (urinary tract infection) 06/13/2018  . Altered mental status 06/11/2018  . Fall 05/13/2018  . Depression 05/13/2018  . Recurrent cellulitis of lower extremity 06/25/2017  . Medication monitoring encounter 06/05/2017  . Cellulitis of left lower leg 04/25/2017  . Adjustment disorder with mixed disturbance of emotions and conduct 04/23/2017  . Foot pain, bilateral 02/03/2017  . Tinea pedis 02/03/2017  . Left ovarian cyst 12/09/2016  . Abdominal aortic atherosclerosis ([place]) 11/11/2016  . Swelling of lower extremity 10/11/2016  . Obstructive sleep apnea 10/11/2016  . Follicular lymphoma of  intra-abdominal lymph nodes ([place]) 08/06/2016  . Multiple falls 06/07/2016  . Inguinal adenopathy 05/29/2016  . Obesity, morbid ([place]) 01/05/2016  . Low HDL (under 40) 12/19/2015  . Cellulitis 12/18/2015  . Skin ulcer ([place]) 11/08/2015  . Peripheral vascular disease of lower extremity with ulceration ([place]) 11/08/2015  . CKD (chronic kidney disease) stage 3, GFR 30-59 ml/min (HCC) 11/08/2015  . Constipation due to pain medication 01/31/2015  . Obstructive sleep apnea of adult 01/13/2015  . Pelvic muscle wasting 01/13/2015  . Incomplete bladder emptying 01/13/2015  . Headache, migraine 10/24/2014  . Neurogenic bladder 10/24/2014  . Current tobacco use 10/24/2014  . Lumbar radiculopathy, chronic 10/02/2013  . COPD with bronchial hyperresponsiveness ([place]) 10/02/2013  . Major depressive disorder, recurrent, in partial remission ([place]) 10/02/2013  . Essential hypertension 10/02/2013  . Multiple sclerosis ([place]) 10/02/2013  . Absence of bladder continence 09/25/2012  . Acontractile bladder 02/12/2012  . Narrowing of intervertebral disc space 11/26/2011  . Kyphoscoliosis and scoliosis 11/26/2011    Orientation RESPIRATION BLADDER Height & Weight            Weight:   Height:     BEHAVIORAL SYMPTOMS/MOOD NEUROLOGICAL BOWEL NUTRITION STATUS           AMBULATORY STATUS COMMUNICATION OF NEEDS Skin                               Personal Care Assistance Level of Assistance  Functional Limitations Info             SPECIAL CARE FACTORS FREQUENCY                       Contractures      Additional Factors Info                  Current Medications (02/06/2020):  This is the current hospital active medication list Current Facility-Administered Medications  Medication Dose Route Frequency Provider Last Rate Last Admin  . acetaminophen (TYLENOL) tablet 1,000 mg  1,000 mg Oral Q8H Haddix, Thomasene Lot, MD   1,000 mg at 02/06/20 0526  . amLODipine (NORVASC)  tablet 10 mg  10 mg Oral Daily Joette Catching T, MD      . atorvastatin (LIPITOR) tablet 10 mg  10 mg Oral QHS Haddix, Thomasene Lot, MD   10 mg at 02/05/20 2157  . baclofen (LIORESAL) tablet 10 mg  10 mg Oral TID Shona Needles, MD   10 mg at 02/05/20 2157  . Chlorhexidine Gluconate Cloth 2 % PADS 6 each  6 each Topical Q0600 Cherene Altes, MD      . clonazePAM Bobbye [place]) tablet 0.5 mg  0.5 mg Oral BID Cherene Altes, MD   0.5 mg at 02/05/20 2157  . diphenhydrAMINE (BENADRYL) 12.5 MG/5ML elixir 12.5 mg  12.5 mg Oral Q6H PRN Haddix, Thomasene Lot, MD      . DULoxetine (CYMBALTA) DR capsule 30 mg  30 mg Oral Daily Haddix, Thomasene Lot, MD   30 mg at 02/05/20 0912  . enoxaparin (LOVENOX) injection 40 mg  40 mg Subcutaneous Q24H Cherene Altes, MD   40 mg at 02/05/20 2157  . gabapentin (NEURONTIN) tablet 600 mg  600 mg Oral TID Cherene Altes, MD   600 mg at 02/05/20 2201  . hydrALAZINE (APRESOLINE) tablet 50 mg  50 mg Oral Q8H Haddix, Thomasene Lot, MD   50 mg at 02/06/20 0525  . HYDROmorphone (DILAUDID) injection 0.5 mg  0.5 mg Intravenous Q2H PRN Haddix, Thomasene Lot, MD      . methocarbamol (ROBAXIN) 500 mg in dextrose 5 % 50 mL IVPB  500 mg Intravenous Q8H PRN Haddix, Thomasene Lot, MD      . methocarbamol (ROBAXIN) tablet 500 mg  500 mg Oral Q8H PRN Haddix, Thomasene Lot, MD   500 mg at 02/05/20 0534  . mometasone-formoterol (DULERA) 200-5 MCG/ACT inhaler 2 puff  2 puff Inhalation BID Haddix, Thomasene Lot, MD   2 puff at 02/05/20 2116  . mupirocin ointment (BACTROBAN) 2 % 1 application  1 application Nasal BID Cherene Altes, MD   1 application at 62/95/28 2158  . ondansetron (ZOFRAN-ODT) disintegrating tablet 4 mg  4 mg Oral Q8H PRN Haddix, Thomasene Lot, MD      . oxyCODONE (Oxy IR/ROXICODONE) immediate release tablet 10 mg  10 mg Oral Q3H PRN Cherene Altes, MD   10 mg at 02/05/20 1819  . oxyCODONE (Oxy IR/ROXICODONE) immediate release tablet 5 mg  5 mg Oral Q3H PRN Cherene Altes, MD      . vortioxetine HBr  (TRINTELLIX) tablet 5 mg  5 mg Oral Daily Cherene Altes, MD       Facility-Administered Medications Ordered in Other Encounters  Medication Dose Route Frequency Provider Last Rate Last Admin  . heparin lock flush 100 unit/mL  500 Units Intracatheter Once PRN Sindy Guadeloupe, MD  Discharge Medications: Please see discharge summary for a list of discharge medications.  Relevant Imaging Results:  Relevant Lab Results:   Additional Information    Joanne Chars, LCSW

## 2020-02-06 NOTE — TOC Initial Note (Signed)
Transition of Care [facility]) - Initial/Assessment Note    Patient Details  Name: [name] MRN: [id number] Date of Birth: Jul 12, 1950  Transition of Care [facility]) CM/SW Contact:    Joanne Chars, LCSW Phone Number: 02/06/2020, 10:24 AM  Clinical Narrative:    CSW met with pt to discuss DC plan.  Pt agreeable to SNF and has been to several facilities before.  Pt from [place], does not want to go to [facility] but open to other SNF in [place] area.  Permission given to send out information in hub, permission given to speak with husband and daughter.  Choice document provided.  Pt is vaccinated.  [facility] currently active with pt.  Pt has rollator, wheel chair, and 3n1 in the home currently.  Pt sent out in hub.               Expected Discharge Plan: Skilled Nursing Facility Barriers to Discharge: Continued Medical Work up, SNF Pending bed offer   Patient Goals and CMS Choice Patient states their goals for this hospitalization and ongoing recovery are:: "get walking again" CMS Medicare.gov Compare Post Acute Care list provided to:: Patient Choice offered to / list presented to : Patient  Expected Discharge Plan and Services Expected Discharge Plan: [place] Choice: [place] arrangements for the past 2 months: Single Family Home                                      Prior Living Arrangements/Services Living arrangements for the past 2 months: Single Family Home Lives with:: Spouse Patient language and need for interpreter reviewed:: Yes Do you feel safe going back to the place where you live?: Yes      Need for Family Participation in Patient Care: Yes (Comment) Care giver support system in place?: Yes (comment) Current home services: Home PT ([facility]) Criminal Activity/Legal Involvement Pertinent to Current Situation/Hospitalization: No - Comment as needed  Activities of Daily Living Home  Assistive Devices/Equipment: Walker (specify type) ADL Screening (condition at time of admission) Patient's cognitive ability adequate to safely complete daily activities?: Yes Is the patient deaf or have difficulty hearing?: No Does the patient have difficulty seeing, even when wearing glasses/contacts?: Yes Does the patient have difficulty concentrating, remembering, or making decisions?: Yes Patient able to express need for assistance with ADLs?: Yes Does the patient have difficulty dressing or bathing?: Yes Independently performs ADLs?: No Communication: Independent Dressing (OT): Needs assistance Is this a change from baseline?: Pre-admission baseline Grooming: Independent Feeding: Independent Bathing: Independent Toileting: Independent In/Out Bed: Needs assistance Is this a change from baseline?: Pre-admission baseline Walks in Home: Independent with device (comment) Does the patient have difficulty walking or climbing stairs?: Yes Weakness of Legs: Left Weakness of Arms/Hands: Left  Permission Sought/Granted Permission sought to share information with : Facility Sport and exercise psychologist, Family Supports Permission granted to share information with : Yes, Verbal Permission Granted  Share Information with NAME: husband [name], Daughter [name]  Permission granted to share info w AGENCY: SNF        Emotional Assessment Appearance:: Appears stated age Attitude/Demeanor/Rapport: Engaged Affect (typically observed): Pleasant, Appropriate Orientation: : Oriented to Self, Oriented to Place, Oriented to  Time, Oriented to Situation Alcohol / Substance Use: Not Applicable Psych Involvement: No (comment)  Admission diagnosis:  Femur fracture, left ([place]) [S72.92XA] Patient Active Problem  List   Diagnosis Date Noted  . Closed fracture of distal end of left femur ([place]) 02/04/2020  . Femur fracture, left ([place]) 02/04/2020  . Weakness of left leg 01/26/2020  . Left leg weakness  01/26/2020  . COPD (chronic obstructive pulmonary disease) ([place]) 01/01/2020  . Chronic indwelling Foley catheter 11/19/2019  . Neuropathy   . Hypokalemia   . Hyperlipidemia   . Sepsis secondary to UTI ([place]) 10/17/2019  . Weakness   . AKI (acute kidney injury) ([place]) 04/20/2019  . Pressure injury of skin 02/03/2019  . Bilateral lower leg cellulitis 02/02/2019  . Ovarian mass, left 01/27/2019  . Sepsis ([place]) 01/05/2019  . UTI (urinary tract infection) 06/13/2018  . Altered mental status 06/11/2018  . Fall 05/13/2018  . Depression 05/13/2018  . Recurrent cellulitis of lower extremity 06/25/2017  . Medication monitoring encounter 06/05/2017  . Cellulitis of left lower leg 04/25/2017  . Adjustment disorder with mixed disturbance of emotions and conduct 04/23/2017  . Foot pain, bilateral 02/03/2017  . Tinea pedis 02/03/2017  . Left ovarian cyst 12/09/2016  . Abdominal aortic atherosclerosis ([place]) 11/11/2016  . Swelling of lower extremity 10/11/2016  . Obstructive sleep apnea 10/11/2016  . Follicular lymphoma of intra-abdominal lymph nodes ([place]) 08/06/2016  . Multiple falls 06/07/2016  . Inguinal adenopathy 05/29/2016  . Obesity, morbid ([place]) 01/05/2016  . Low HDL (under 40) 12/19/2015  . Cellulitis 12/18/2015  . Skin ulcer ([place]) 11/08/2015  . Peripheral vascular disease of lower extremity with ulceration ([place]) 11/08/2015  . CKD (chronic kidney disease) stage 3, GFR 30-59 ml/min (HCC) 11/08/2015  . Constipation due to pain medication 01/31/2015  . Obstructive sleep apnea of adult 01/13/2015  . Pelvic muscle wasting 01/13/2015  . Incomplete bladder emptying 01/13/2015  . Headache, migraine 10/24/2014  . Neurogenic bladder 10/24/2014  . Current tobacco use 10/24/2014  . Lumbar radiculopathy, chronic 10/02/2013  . COPD with bronchial hyperresponsiveness ([place]) 10/02/2013  . Major depressive disorder, recurrent, in partial remission ([place]) 10/02/2013  . Essential hypertension  10/02/2013  . Multiple sclerosis ([place]) 10/02/2013  . Absence of bladder continence 09/25/2012  . Acontractile bladder 02/12/2012  . Narrowing of intervertebral disc space 11/26/2011  . Kyphoscoliosis and scoliosis 11/26/2011   PCP:  Kirk Ruths, MD Pharmacy:   [place], [place] - [address] [place] [address] Phone: [phone] Fax: [phone]  [facility], [place], [place] - [name] [place] [address] Phone: [phone] Fax: [phone]     Social Determinants of Health (SDOH) Interventions    Readmission Risk Interventions Readmission Risk Prevention Plan 01/03/2020 04/22/2019 02/04/2019  Transportation Screening Complete Complete -  PCP or Specialist Appt within 3-5 Days - - -  [place] or [place] - - Complete  Social Work Consult for [place] Planning/Counseling - - Complete  Palliative Care Screening - - Not Applicable  Medication Review (RN Care Manager) Complete Referral to Pharmacy -  PCP or Specialist appointment within 3-5 days of discharge Complete Complete -  [place] or Home Care Consult Complete Complete -  SW Recovery Care/Counseling Consult Complete Complete -  Palliative Care Screening Not Applicable Not Applicable -  [place] - Complete -  Some recent data might be hidden

## 2020-02-06 NOTE — Progress Notes (Signed)
SPORTS MEDICINE AND JOINT REPLACEMENT  Lara Mulch, MD    Carlyon Shadow, PA-C [address], [place], [place]  [address]                             [phone]   PROGRESS NOTE  Subjective:  negative for Chest Pain  negative for Shortness of Breath  negative for Nausea/Vomiting   negative for Calf Pain  negative for Bowel Movement   Tolerating Diet: yes         Patient reports pain as 4 on 0-10 scale.    Objective: Vital signs in last 24 hours:   Patient Vitals for the past 24 hrs:  BP Temp Temp src Pulse Resp SpO2  02/06/20 0300 140/67 98.4 F (36.9 C) Oral 65 19 96 %  02/05/20 1900 (!) 130/48 98.7 F (37.1 C) Oral 61 13 95 %  02/05/20 0831 -- -- -- -- 16 97 %    @flow {1959:LAST@   Intake/Output from previous day:   10/09 0701 - 10/10 0700 In: 880 [P.O.:880] Out: 3350 [Urine:3350]   Intake/Output this shift:   No intake/output data recorded.   Intake/Output      10/09 0701 - 10/10 0700 10/10 0701 - 10/11 0700   P.O. 880    I.V.     Total Intake 880    Urine 3350    Blood     Total Output 3350    Net -2470            LABORATORY DATA: Recent Labs    01/31/20 0324 02/01/20 0313 02/04/20 0612 02/05/20 0232 02/06/20 0036  WBC 5.4 6.6 7.4 13.9* 10.0  HGB 8.2* 9.0* 9.0* 8.9* 8.0*  HCT 25.5* 26.6* 29.2* 27.7* 25.3*  PLT 163 178 208 263 216   Recent Labs    01/31/20 0324 02/01/20 0313 02/02/20 0505 02/04/20 0612 02/05/20 0232 02/06/20 0036  NA 139 137  --  139 135 137  K 4.4 4.0  --  4.6 4.7 4.3  CL 104 103  --  103 97* 101  CO2 28 26  --  28 28 27   BUN 36* 41*  --  27* 23 21  CREATININE 1.16* 1.15* 1.18* 1.13* 1.06* 1.04*  GLUCOSE 149* 136*  --  109* 128* 111*  CALCIUM 8.9 9.0  --  8.9 8.7* 8.7*   Lab Results  Component Value Date   INR 1.0 01/26/2020   INR 1.2 01/02/2020   INR 1.1 11/20/2019    Examination:  General appearance: alert, cooperative and no distress Extremities: extremities normal, atraumatic, no cyanosis or  edema  Wound Exam: clean, dry, intact   Drainage:  None: wound tissue dry  Motor Exam: Quadriceps and Hamstrings Intact  Sensory Exam: Superficial Peroneal, Deep Peroneal and Tibial normal   Assessment:    2 Days Post-Op  Procedure(s) (LRB): OPEN REDUCTION INTERNAL FIXATION (ORIF) DISTAL FEMUR FRACTURE (Left)  ADDITIONAL DIAGNOSIS:  Principal Problem:   Closed fracture of distal end of left femur (HCC) Active Problems:   Major depressive disorder, recurrent, in partial remission (HCC)   Essential hypertension   Absence of bladder continence   Obstructive sleep apnea of adult   Weakness of left leg   Femur fracture, left (HCC)     Plan: Physical Therapy as ordered Non Weight Bearing (NWB)  Pain improved and resting comfortably in bed. PT recommending SNF. Will continue to follow until bed placement   Enedina Finner  Yeraldy Spike 02/06/2020, 7:18 AM

## 2020-02-07 ENCOUNTER — Encounter (HOSPITAL_COMMUNITY): Payer: Self-pay | Admitting: Student

## 2020-02-07 ENCOUNTER — Other Ambulatory Visit: Payer: Self-pay

## 2020-02-07 DIAGNOSIS — S72492G Other fracture of lower end of left femur, subsequent encounter for closed fracture with delayed healing: Secondary | ICD-10-CM

## 2020-02-07 DIAGNOSIS — F3341 Major depressive disorder, recurrent, in partial remission: Secondary | ICD-10-CM | POA: Diagnosis not present

## 2020-02-07 DIAGNOSIS — G4733 Obstructive sleep apnea (adult) (pediatric): Secondary | ICD-10-CM | POA: Diagnosis not present

## 2020-02-07 DIAGNOSIS — I10 Essential (primary) hypertension: Secondary | ICD-10-CM | POA: Diagnosis not present

## 2020-02-07 LAB — CBC
HCT: 29.5 % — ABNORMAL LOW (ref 36.0–46.0)
Hemoglobin: 9.2 g/dL — ABNORMAL LOW (ref 12.0–15.0)
MCH: 28.5 pg (ref 26.0–34.0)
MCHC: 31.2 g/dL (ref 30.0–36.0)
MCV: 91.3 fL (ref 80.0–100.0)
Platelets: 264 10*3/uL (ref 150–400)
RBC: 3.23 MIL/uL — ABNORMAL LOW (ref 3.87–5.11)
RDW: 15.9 % — ABNORMAL HIGH (ref 11.5–15.5)
WBC: 8.9 10*3/uL (ref 4.0–10.5)
nRBC: 0 % (ref 0.0–0.2)

## 2020-02-07 MED ORDER — ENOXAPARIN SODIUM 40 MG/0.4ML ~~LOC~~ SOLN: 40.0000 mg | SUBCUTANEOUS | 0 refills | Status: DC

## 2020-02-07 MED ORDER — OXYCODONE HCL 5 MG PO TABS
5.0000 mg | ORAL_TABLET | ORAL | 0 refills | Status: DC | PRN
Start: 2020-02-07 — End: 2021-03-29

## 2020-02-07 NOTE — Progress Notes (Signed)
Physical Therapy Treatment Patient Details Name: [name] MRN: [id number] DOB: 05/22/1950 Today's Date: 02/07/2020    History of Present Illness 69 year old with a history of HTN, COPD, OSA, MS, chronic neurogenic bladder with indwelling Foley catheter, depression, and follicular lymphoma who presented to the [facility] ED with weakness of the left lower extremity.  She was admitted with a presumed MS flare and treatment was initiated with high-dose Solu-Medrol.  Her treatment was completed and she was pending discharge while a SNF was being located.  On 10/6 she attempted to get out of bed alone and fell, after which she began to complain of severe left knee pain.  Plain film x-ray revealed a medial distal femur fracture on the left. Pt underwent ORIF 10/8    PT Comments    Patient in bed upon arrival, easily agitated. Session focused on B LE strengthening at bed level. Patient performed AROM quad sets, SLR, hip abduction, and ankle pumps. Patient was easily agitated during treatment session due to current situation and family issues. Continue to recommend SNF upon discharge to address functional limitations and maximize mobility.   Follow Up Recommendations  SNF     Equipment Recommendations  None recommended by PT    Recommendations for Other Services       Precautions / Restrictions Precautions Precautions: Fall Restrictions Weight Bearing Restrictions: Yes LLE Weight Bearing: Non weight bearing    Mobility  Bed Mobility                  Transfers                    Ambulation/Gait                 Stairs             Wheelchair Mobility    Modified Rankin (Stroke Patients Only)       Balance                                            Cognition Arousal/Alertness: Awake/alert Behavior During Therapy: Agitated Overall Cognitive Status: Impaired/Different from baseline Area of Impairment:  Safety/judgement;Awareness;Problem solving                         Safety/Judgement: Decreased awareness of safety;Decreased awareness of deficits Awareness: Emergent Problem Solving: Requires verbal cues;Difficulty sequencing;Requires tactile cues General Comments: Pt easily agitated with situation when pain becomes prominent      Exercises General Exercises - Lower Extremity Ankle Circles/Pumps: AROM;Both;20 reps;Supine;Strengthening Quad Sets: AROM;Both;10 reps;Supine;Strengthening Hip ABduction/ADduction: AROM;Strengthening;Both;10 reps;Supine Straight Leg Raises: AROM;Strengthening;Both;10 reps;Supine    General Comments General comments (skin integrity, edema, etc.): Pt quickly agitated over situation when pain is more prominent, complaints of being hot. Attempted to fan patient however continued to be agitated with her situation. Pt verbalizes unhappiness with husband and previous care      Pertinent Vitals/Pain Pain Assessment: Faces Faces Pain Scale: Hurts whole lot Pain Location: left leg  Pain Descriptors / Indicators: Grimacing;Discomfort;Guarding Pain Intervention(s): Monitored during session    Home Living                      Prior Function            PT Goals (current goals can now be found in the care plan  section) Acute Rehab PT Goals Patient Stated Goal: to get moving PT Goal Formulation: With patient Time For Goal Achievement: 02/19/20 Potential to Achieve Goals: Fair Progress towards PT goals: Progressing toward goals (slowly)    Frequency    Min 2X/week      PT Plan Current plan remains appropriate    Co-evaluation              AM-PAC PT "6 Clicks" Mobility   Outcome Measure  Help needed turning from your back to your side while in a flat bed without using bedrails?: A Lot Help needed moving from lying on your back to sitting on the side of a flat bed without using bedrails?: Total Help needed moving to and from  a bed to a chair (including a wheelchair)?: Total Help needed standing up from a chair using your arms (e.g., wheelchair or bedside chair)?: Total Help needed to walk in hospital room?: Total Help needed climbing 3-5 steps with a railing? : Total 6 Click Score: 7    End of Session   Activity Tolerance: Treatment limited secondary to agitation;Patient limited by pain Patient left: in bed;with call bell/phone within reach;with bed alarm set Nurse Communication: Mobility status PT Visit Diagnosis: Difficulty in walking, not elsewhere classified (R26.2);Muscle weakness (generalized) (M62.81);Other abnormalities of gait and mobility (R26.89)     Time: 0240-9735 PT Time Calculation (min) (ACUTE ONLY): 30 min  Charges:  $Therapeutic Exercise: 23-37 mins                     Perrin Maltese, PT, DPT Acute Rehabilitation Services Pager [phone] Office [phone]    [name] 02/07/2020, 3:37 PM

## 2020-02-07 NOTE — Progress Notes (Signed)
[name]  TJQ:[id number] DOB: [birth year] DOA: 02/04/2020 PCP: Kirk Ruths, MD    Brief Narrative:  [phone] with a history of HTN, COPD, OSA, MS, chronic neurogenic bladder with indwelling Foley catheter, depression, and follicular lymphoma who presented to the [facility] ED with weakness of the left lower extremity.  She was admitted with a presumed MS flare and treatment was initiated with high-dose Solu-Medrol.  Her treatment was completed and she was pending discharge while a SNF was being located.  On 10/6 she attempted to get out of bed alone, after which she began to complain of severe left knee pain.  It is not entirely clear to me if she fell or not.  Plain film x-ray revealed a medial distal femur fracture on the left.  Given the complex nature of her fracture the patient was transferred to [facility] for surgical correction.  Significant Events:  10/8 transfer to [facility] 10/8 ORIF left medial condyle fracture -ORIF left femoral shaft fracture  Antimicrobials:  None  DVT prophylaxis: lovenox   Subjective: Afebrile.  Vital signs stable.  Hemoglobin holding steady at approximately 9.  No new complaints today.  Assessment & Plan:  Mildly displaced comminuted spiral fracture distal left femur extending into the intercondylar notch Care per Orthopedic Surgery - status post surgical correction 10/8 - PT/OT working w/ pt -awaiting SNF placement  Multiple sclerosis recently completed 5 days of high-dose Solu-Medrol under the care of Neurology - appears to be in remission at present  Acute kidney injury on CKD stage IIIa Resolved with volume resuscitation at [facility]  HTN Usual lisinopril on hold due to recent acute kidney injury -continue other usual home meds -BP controlled  Chronic neurogenic bladder Chronic indwelling Foley remains  Obstructive sleep apnea Patient does not use her CPAP at home  Abnormal UA Present at time of admission to [facility] -not likely UTI as patient  was asymptomatic and has a chronic indwelling Foley - was treated with antibiotic at [facility] nonetheless  Severe debility -generalized weakness PT/OT recommended SNF -attempting placement now  Code Status: FULL CODE Family Communication:  Status is: Inpatient  Remains inpatient appropriate because:Inpatient level of care appropriate due to severity of illness   Dispo: The patient is from: Home              Anticipated d/c is to: SNF              Anticipated d/c date is: 2 days              Patient currently is not medically stable to d/c.   Consultants:  Ortho   Objective: Blood pressure (!) 110/58, pulse 65, temperature 98.2 F (36.8 C), temperature source Oral, resp. rate 17, height 5\' 4"  (1.626 m), weight 123.3 kg, SpO2 99 %.  Intake/Output Summary (Last 24 hours) at 02/07/2020 0908 Last data filed at 02/07/2020 6333 Gross per 24 hour  Intake 720 ml  Output 1950 ml  Net -1230 ml   Filed Weights   02/06/20 2110  Weight: 123.3 kg    Examination: General: No acute respiratory distress Lungs: Clear to auscultation B Cardiovascular: RRR Abdomen: Obese, NT/ND, soft, bs+ Extremities: trace B LE edema without change   CBC: Recent Labs  Lab 02/05/20 0232 02/06/20 0036 02/07/20 0647  WBC 13.9* 10.0 8.9  HGB 8.9* 8.0* 9.2*  HCT 27.7* 25.3* 29.5*  MCV 89.4 88.8 91.3  PLT 263 216 545   Basic Metabolic Panel: Recent Labs  Lab  02/04/20 0612 02/05/20 0232 02/06/20 0036  NA 139 135 137  K 4.6 4.7 4.3  CL 103 97* 101  CO2 28 28 27   GLUCOSE 109* 128* 111*  BUN 27* 23 21  CREATININE 1.13* 1.06* 1.04*  CALCIUM 8.9 8.7* 8.7*   GFR: Estimated Creatinine Clearance: 66.2 mL/min (A) (by C-G formula based on SCr of 1.04 mg/dL (H)).  Liver Function Tests: No results for input(s): AST, ALT, ALKPHOS, BILITOT, PROT, ALBUMIN in the last 168 hours. No results for input(s): LIPASE, AMYLASE in the last 168 hours. No results for input(s): AMMONIA in the last 168  hours.  Coagulation Profile: No results for input(s): INR, PROTIME in the last 168 hours.  Cardiac Enzymes: No results for input(s): CKTOTAL, CKMB, CKMBINDEX, TROPONINI in the last 168 hours.  HbA1C: Hgb A1c MFr Bld  Date/Time Value Ref Range Status  01/26/2020 03:03 PM 5.4 4.8 - 5.6 % Final    Comment:    (NOTE) Pre diabetes:          5.7%-6.4%  Diabetes:              >6.4%  Glycemic control for   <7.0% adults with diabetes   06/20/2019 06:27 PM 5.0 4.8 - 5.6 % Final    Comment:    (NOTE) Pre diabetes:          5.7%-6.4% Diabetes:              >6.4% Glycemic control for   <7.0% adults with diabetes     CBG: Recent Labs  Lab 02/02/20 2127 02/03/20 0721 02/03/20 1114 02/03/20 1640 02/03/20 2053  GLUCAP 111* 80 126* 112* 141*    Recent Results (from the past 240 hour(s))  Surgical pcr screen     Status: Abnormal   Collection Time: 02/04/20 12:20 PM   Specimen: Nasal Mucosa; Nasal Swab  Result Value Ref Range Status   MRSA, PCR POSITIVE (A) NEGATIVE Final    Comment: RESULT CALLED TO, READ BACK BY AND VERIFIED WITH: Eulogio Ditch RN 14:35 02/04/20 ([email or web address])    Staphylococcus aureus POSITIVE (A) NEGATIVE Final    Comment: (NOTE) The Xpert SA Assay (FDA approved for NASAL specimens in patients 30 years of age and older), is one component of a comprehensive surveillance program. It is not intended to diagnose infection nor to guide or monitor treatment. Performed at [facility], [place] [address], [place], [place] [address]      Scheduled Meds: . acetaminophen  1,000 mg Oral Q8H  . amLODipine  10 mg Oral Daily  . atorvastatin  10 mg Oral QHS  . baclofen  10 mg Oral TID  . Chlorhexidine Gluconate Cloth  6 each Topical Q0600  . clonazePAM  0.5 mg Oral BID  . DULoxetine  30 mg Oral Daily  . enoxaparin (LOVENOX) injection  40 mg Subcutaneous Q24H  . gabapentin  600 mg Oral TID  . hydrALAZINE  50 mg Oral Q8H  . mometasone-formoterol  2 puff Inhalation BID   . mupirocin ointment  1 application Nasal BID  . vortioxetine HBr  5 mg Oral Daily   Continuous Infusions: . methocarbamol (ROBAXIN) IV       LOS: 3 days   Cherene Altes, MD [facility] Office  [phone] Pager - Text Page per Amion  If 7PM-7AM, please contact night-coverage per Amion 02/07/2020, 9:08 AM

## 2020-02-07 NOTE — Progress Notes (Signed)
Orthopaedic Trauma Progress Note  S: Doing okay. Working on Research officer, political party. No specific questions or issues for me  O:  Vitals:   02/07/20 0944 02/07/20 1334  BP:  (!) 122/59  Pulse:  82  Resp:  18  Temp:  98.6 F (37 C)  SpO2: 96% 97%   NAD, LLE: dressing removed. Incisions clean, dry and intact Neurovascularly unchanged  Imaging: Stable postop imaging  Labs:  Results for orders placed or performed during the hospital encounter of 02/04/20 (from the past 24 hour(s))  CBC     Status: Abnormal   Collection Time: 02/07/20  6:47 AM  Result Value Ref Range   WBC 8.9 4.0 - 10.5 K/uL   RBC 3.23 (L) 3.87 - 5.11 MIL/uL   Hemoglobin 9.2 (L) 12.0 - 15.0 g/dL   HCT 29.5 (L) 36 - 46 %   MCV 91.3 80.0 - 100.0 fL   MCH 28.5 26.0 - 34.0 pg   MCHC 31.2 30.0 - 36.0 g/dL   RDW 15.9 (H) 11.5 - 15.5 %   Platelets 264 150 - 400 K/uL   nRBC 0.0 0.0 - 0.2 %    Assessment: 69 yo female s/p fall  Injuries: Left distal femur fracture w/ medial condyle fracture s/p ORIF on 10/8  Weightbearing: NWB to left leg. Okay for TDWB if assists with mobilization  Insicional and dressing care: okay to leave open to air and shower  Orthopedic device(s):None  CV/Blood loss:Hgb 9.2 this AM. No need for transfusion  Pain management: 1. Baclofen 10 mg TID 2. Klonopin 0.5mg  BID 3. Gabapentin 600mg  TID 4. Robaxin 500mg  q8 hrs PRN 5. Oxycodone 5-10mg  q3 hrs PRN  VTE prophylaxis: Lovenox 40 mg daily  Foley/Lines: Chronic indwelling foley from MS  Medical co-morbidities: MS- multiple meds AKI-per hospitalist  Dispo: DC to SNF. Okay to DC from ortho perspective. Scripts for pain meds and VTE prophylaxis in chart  Follow - up plan: 2-3 weeks for x-rays  Shona Needles, MD [facility] [phone] (office) [email or web address]

## 2020-02-07 NOTE — Discharge Instructions (Signed)
Orthopaedic Trauma Service Discharge Instructions   General Discharge Instructions  Orthopaedic Injuries:  Left distal femur fracture s/p open reduction internal fixation  WEIGHT BEARING STATUS:NWB LLE  RANGE OF MOTION/ACTIVITY:Unrestricted range of motion of the knee  Wound Care:Okay to leave incisions open to air and okay to shower  DVT/PE prophylaxis:Lovenox 40 mg daily for 30 days  Diet: as you were eating previously.  Can use over the counter stool softeners and bowel preparations, such as Miralax, to help with bowel movements.  Narcotics can be constipating.  Be sure to drink plenty of fluids  PAIN MEDICATION USE AND EXPECTATIONS  You have likely been given narcotic medications to help control your pain.  After a traumatic event that results in an fracture (broken bone) with or without surgery, it is ok to use narcotic pain medications to help control one's pain.  We understand that everyone responds to pain differently and each individual patient will be evaluated on a regular basis for the continued need for narcotic medications. Ideally, narcotic medication use should last no more than 6-8 weeks (coinciding with fracture healing).   As a patient it is your responsibility as well to monitor narcotic medication use and report the amount and frequency you use these medications when you come to your office visit.   We would also advise that if you are using narcotic medications, you should take a dose prior to therapy to maximize you participation.  IF YOU ARE ON NARCOTIC MEDICATIONS IT IS NOT PERMISSIBLE TO OPERATE A MOTOR VEHICLE (MOTORCYCLE/CAR/TRUCK/MOPED) OR HEAVY MACHINERY DO NOT MIX NARCOTICS WITH OTHER CNS (CENTRAL NERVOUS SYSTEM) DEPRESSANTS SUCH AS ALCOHOL   STOP SMOKING OR USING NICOTINE PRODUCTS!!!!  As discussed nicotine severely impairs your body's ability to heal surgical and traumatic wounds but also impairs bone healing.  Wounds and bone heal by forming  microscopic blood vessels (angiogenesis) and nicotine is a vasoconstrictor (essentially, shrinks blood vessels).  Therefore, if vasoconstriction occurs to these microscopic blood vessels they essentially disappear and are unable to deliver necessary nutrients to the healing tissue.  This is one modifiable factor that you can do to dramatically increase your chances of healing your injury.    (This means no smoking, no nicotine gum, patches, etc)  DO NOT USE NONSTEROIDAL ANTI-INFLAMMATORY DRUGS (NSAID'S)  Using products such as Advil (ibuprofen), Aleve (naproxen), Motrin (ibuprofen) for additional pain control during fracture healing can delay and/or prevent the healing response.  If you would like to take over the counter (OTC) medication, Tylenol (acetaminophen) is ok.  However, some narcotic medications that are given for pain control contain acetaminophen as well. Therefore, you should not exceed more than 4000 mg of tylenol in a day if you do not have liver disease.  Also note that there are may OTC medicines, such as cold medicines and allergy medicines that my contain tylenol as well.  If you have any questions about medications and/or interactions please ask your doctor/PA or your pharmacist.      ICE AND ELEVATE INJURED/OPERATIVE EXTREMITY  Using ice and elevating the injured extremity above your heart can help with swelling and pain control.  Icing in a pulsatile fashion, such as 20 minutes on and 20 minutes off, can be followed.    Do not place ice directly on skin. Make sure there is a barrier between to skin and the ice pack.    Using frozen items such as frozen peas works well as the conform nicely to the are that needs  to be iced.  USE AN ACE WRAP OR TED HOSE FOR SWELLING CONTROL  In addition to icing and elevation, Ace wraps or TED hose are used to help limit and resolve swelling.  It is recommended to use Ace wraps or TED hose until you are informed to stop.    When using Ace Wraps  start the wrapping distally (farthest away from the body) and wrap proximally (closer to the body)   Example: If you had surgery on your leg or thing and you do not have a splint on, start the ace wrap at the toes and work your way up to the thigh        If you had surgery on your upper extremity and do not have a splint on, start the ace wrap at your fingers and work your way up to the upper arm  IF YOU ARE IN A SPLINT OR CAST DO NOT [place]   If your splint gets wet for any reason please contact the office immediately. You may shower in your splint or cast as long as you keep it dry.  This can be done by wrapping in a cast cover or garbage back (or similar)  Do Not stick any thing down your splint or cast such as pencils, money, or hangers to try and scratch yourself with.  If you feel itchy take benadryl as prescribed on the bottle for itching  IF YOU ARE IN A CAM BOOT (BLACK BOOT)  You may remove boot periodically. Perform daily dressing changes as noted below.  Wash the liner of the boot regularly and wear a sock when wearing the boot. It is recommended that you sleep in the boot until told otherwise    Call office for the following:  Temperature greater than 101F  Persistent nausea and vomiting  Severe uncontrolled pain  Redness, tenderness, or signs of infection (pain, swelling, redness, odor or green/yellow discharge around the site)  Difficulty breathing, headache or visual disturbances  Hives  Persistent dizziness or light-headedness  Extreme fatigue  Any other questions or concerns you may have after discharge  In an emergency, call 911 or go to an Emergency Department at a nearby hospital  HELPFUL INFORMATION  ? If you had a block, it will wear off between 8-24 hrs postop typically.  This is period when your pain may go from nearly zero to the pain you would have had postop without the block.  This is an abrupt transition but nothing dangerous is  happening.  You may take an extra dose of narcotic when this happens.  ? You should wean off your narcotic medicines as soon as you are able.  Most patients will be off or using minimal narcotics before their first postop appointment.   ? We suggest you use the pain medication the first night prior to going to bed, in order to ease any pain when the anesthesia wears off. You should avoid taking pain medications on an empty stomach as it will make you nauseous.  ? Do not drink alcoholic beverages or take illicit drugs when taking pain medications.  ? In most states it is against the law to drive while you are in a splint or sling.  And certainly against the law to drive while taking narcotics.  ? You may return to work/school in the next couple of days when you feel up to it.   ? Pain medication may make you constipated.  Below are a  few solutions to try in this order: - Decrease the amount of pain medication if you aren't having pain. - Drink lots of decaffeinated fluids. - Drink prune juice and/or each dried prunes  o If the first 3 don't work start with additional solutions - Take Colace - an over-the-counter stool softener - Take Senokot - an over-the-counter laxative - Take Miralax - a stronger over-the-counter laxative     CALL THE OFFICE WITH ANY QUESTIONS OR CONCERNS: [phone]   VISIT OUR WEBSITE FOR ADDITIONAL INFORMATION: [email or web address]    Discharge Wound Care Instructions  Do NOT apply any ointments, solutions or lotions to pin sites or surgical wounds.  These prevent needed drainage and even though solutions like hydrogen peroxide kill bacteria, they also damage cells lining the pin sites that help fight infection.  Applying lotions or ointments can keep the wounds moist and can cause them to breakdown and open up as well. This can increase the risk for infection. When in doubt call the office.  Surgical incisions should be dressed daily.  If any drainage is  noted, use one layer of adaptic, then gauze, Kerlix, and an ace wrap.  Once the incision is completely dry and without drainage, it may be left open to air out.  Showering may begin 36-48 hours later.  Cleaning gently with soap and water.  Traumatic wounds should be dressed daily as well.    One layer of adaptic, gauze, Kerlix, then ace wrap.  The adaptic can be discontinued once the draining has ceased    If you have a wet to dry dressing: wet the gauze with saline the squeeze as much saline out so the gauze is moist (not soaking wet), place moistened gauze over wound, then place a dry gauze over the moist one, followed by Kerlix wrap, then ace wrap.

## 2020-02-08 DIAGNOSIS — G4733 Obstructive sleep apnea (adult) (pediatric): Secondary | ICD-10-CM | POA: Diagnosis not present

## 2020-02-08 DIAGNOSIS — I10 Essential (primary) hypertension: Secondary | ICD-10-CM | POA: Diagnosis not present

## 2020-02-08 DIAGNOSIS — F3341 Major depressive disorder, recurrent, in partial remission: Secondary | ICD-10-CM | POA: Diagnosis not present

## 2020-02-08 DIAGNOSIS — S72492G Other fracture of lower end of left femur, subsequent encounter for closed fracture with delayed healing: Secondary | ICD-10-CM | POA: Diagnosis not present

## 2020-02-08 MED ORDER — LISINOPRIL 10 MG PO TABS
10.0000 mg | ORAL_TABLET | Freq: Every day | ORAL | Status: DC
  Administered 2020-02-08 – 2020-02-10 (×2): 10 mg via ORAL
  Filled 2020-02-08 (×3): qty 1

## 2020-02-08 NOTE — Care Management Important Message (Signed)
Important Message  Patient Details  Name: [name] MRN: [id number] Date of Birth: 01-Apr-1951   Medicare Important Message Given:  Yes - Important Message mailed due to current National Emergency  Verbal consent obtained due to current National Emergency  Relationship to patient: Self Contact Name: Tiffiney Sparrow Call Date: 02/08/20  Time: 1218 Phone: [phone] Outcome: Spoke with contact Important Message mailed to: Patient address on file    Delorse Lek 02/08/2020, 12:19 PM

## 2020-02-08 NOTE — Discharge Summary (Addendum)
DISCHARGE SUMMARY  [name]  MR#: [id number]  DOB:08/30/1950  Date of Admission: 02/04/2020 Date of Discharge:   Attending Physician:Wauneta Silveria Hennie Duos, MD  Patient's HQI:[email or web address], Ocie Cornfield, MD  Consults: Orthopedics  Disposition: D/C to SNF   Follow-up Appts:  Follow-up Information    Haddix, Thomasene Lot, MD Follow up in 2 week(s).   Specialty: Orthopedic Surgery Contact information: [place] [id number] [phone]               Discharge Diagnoses: Mildly displaced comminuted spiral fracture distal left femur extending into the intercondylar notch Multiple sclerosis Acute kidney injury on CKD stage IIIa HTN Chronic neurogenic bladder w/ chronic indwelling foley cath  Obstructive sleep apnea Severe debility - generalized weakness  Initial presentation: 69yo with a history of HTN, COPD, OSA, MS, chronic neurogenic bladder with indwelling Foley catheter, depression, and follicular lymphoma who presented to the [facility] ED with weakness of the left lower extremity.  She was admitted with a presumed MS flare and treatment was initiated with high-dose Solu-Medrol.  Her treatment was completed and she was pending discharge while a SNF was being located.  On 10/6 she attempted to get out of bed alone, after which she began to complain of severe left knee pain.  It is not entirely clear to me if she fell or not.  Plain film x-ray revealed a medial distal femur fracture on the left.  Given the complex nature of her fracture the patient was transferred to [facility] for surgical correction.  Hospital Course: 10/8 transfer to [facility] 10/8 ORIF left medial condyle fracture -ORIF left femoral shaft fracture  Mildly displaced comminuted spiral fracture distal left femur extending into the intercondylar notch Care provided by Orthopedic Surgery - status post surgical correction 10/8 - PT/OT worked w/ pt - awaiting SNF placement - cleared for d/c from  Orthopedic perspective   Multiple sclerosis completed 5 days of high-dose Solu-Medrol under the care of Neurology while admitted at [facility] - has been in remission during stay at [facility]   Acute kidney injury on CKD stage IIIa Resolved with volume resuscitation at [facility] and stable while at [facility]   HTN continue usual home meds -BP controlled  Chronic neurogenic bladder Chronic indwelling Foley remains  Obstructive sleep apnea Patient does not use her CPAP at home  Abnormal UA Present at time of admission to [facility] -not likely UTI as patient was asymptomatic and has a chronic indwelling Foley - was treated with antibiotic at [facility] nonetheless  Severe debility -generalized weakness PT/OT recommended SNF   Allergies as of 02/08/2020   No Known Allergies   Med Rec must be completed prior to using this [place]  D/C meds to be reconciled and then inserted into this note at the actual time of d/c. (currently awaiting SNF bed procurement)  Day of Discharge BP (!) 110/50 (BP Location: Right Arm)   Pulse 72   Temp 98.3 F (36.8 C) (Oral)   Resp 17   Ht 5\' 4"  (1.626 m)   Wt 123.3 kg   SpO2 100%   BMI 46.66 kg/m   Physical Exam: General: No acute respiratory distress Lungs: Clear to auscultation bilaterally without wheezes or crackles Cardiovascular: Regular rate and rhythm without murmur gallop or rub normal S1 and S2 Abdomen: Nontender, nondistended, soft, bowel sounds positive, no rebound, no ascites, no appreciable mass Extremities: No significant edema bilateral lower extremities  Basic Metabolic Panel: Recent Labs  Lab 02/02/20 0505  02/04/20 0612 02/05/20 0232 02/06/20 0036  NA  --  139 135 137  K  --  4.6 4.7 4.3  CL  --  103 97* 101  CO2  --  28 28 27   GLUCOSE  --  109* 128* 111*  BUN  --  27* 23 21  CREATININE 1.18* 1.13* 1.06* 1.04*  CALCIUM  --  8.9 8.7* 8.7*    CBC: Recent Labs  Lab 02/04/20 0612 02/05/20 0232 02/06/20 0036  02/07/20 0647  WBC 7.4 13.9* 10.0 8.9  HGB 9.0* 8.9* 8.0* 9.2*  HCT 29.2* 27.7* 25.3* 29.5*  MCV 89.3 89.4 88.8 91.3  PLT 208 263 216 264    CBG: Recent Labs  Lab 02/02/20 2127 02/03/20 0721 02/03/20 1114 02/03/20 1640 02/03/20 2053  GLUCAP 111* 80 126* 112* 141*    Recent Results (from the past 240 hour(s))  Surgical pcr screen     Status: Abnormal   Collection Time: 02/04/20 12:20 PM   Specimen: Nasal Mucosa; Nasal Swab  Result Value Ref Range Status   MRSA, PCR POSITIVE (A) NEGATIVE Final    Comment: RESULT CALLED TO, READ BACK BY AND VERIFIED WITH: Eulogio Ditch RN 14:35 02/04/20 ([email or web address])    Staphylococcus aureus POSITIVE (A) NEGATIVE Final    Comment: (NOTE) The Xpert SA Assay (FDA approved for NASAL specimens in patients 18 years of age and older), is one component of a comprehensive surveillance program. It is not intended to diagnose infection nor to guide or monitor treatment. Performed at [facility], [place] [address], [place], [place] [address]       02/08/2020, 9:20 AM   Cherene Altes, MD [facility] Office  [phone]

## 2020-02-08 NOTE — TOC CAGE-AID Note (Signed)
Transition of Care [facility]) - CAGE-AID Screening   Patient Details  Name: [name] MRN: [id number] Date of Birth: May 04, 1950  Transition of Care [facility]) CM/SW Contact:    Emeterio Reeve, [place] Phone Number: 02/08/2020, 2:12 PM   Clinical Narrative:  CSW met with pt at bedside. CSW introduced self and explained her role at the hospital.  Pt report a shot of liquor 2-3 times of year. Pt denies substance use. Pt did not need resources.  CAGE-AID Screening:    Have You Ever Felt You Ought to Cut Down on Your Drinking or Drug Use?: No Have People Annoyed You By Critizing Your Drinking Or Drug Use?: No Have You Felt Bad Or Guilty About Your Drinking Or Drug Use?: No Have You Ever Had a Drink or Used Drugs First Thing In The Morning to Steady Your Nerves or to Get Rid of a Hangover?: No CAGE-AID Score: 0  Substance Abuse Education Offered: Yes    Blima Ledger, [place] Social Worker [phone]

## 2020-02-09 DIAGNOSIS — I10 Essential (primary) hypertension: Secondary | ICD-10-CM | POA: Diagnosis not present

## 2020-02-09 DIAGNOSIS — D649 Anemia, unspecified: Secondary | ICD-10-CM

## 2020-02-09 LAB — SARS CORONAVIRUS 2 BY RT PCR (HOSPITAL ORDER, PERFORMED IN ~~LOC~~ HOSPITAL LAB): SARS Coronavirus 2: NEGATIVE

## 2020-02-09 MED ORDER — HYDRALAZINE HCL 50 MG PO TABS
50.0000 mg | ORAL_TABLET | Freq: Three times a day (TID) | ORAL | Status: DC
  Administered 2020-02-10: 50 mg via ORAL
  Filled 2020-02-09: qty 1

## 2020-02-09 NOTE — Progress Notes (Signed)
[facility] PROGRESS NOTE   [name] DGL:[id number] DOB: 11/10/1950 DOA: 02/04/2020  PCP: Kirk Ruths, MD  Brief History/Interval Summary: 69 yo with a history of HTN, COPD, OSA, MS, chronic neurogenic bladder with indwelling Foley catheter, depression, and follicular lymphoma who presented to the [facility] ED with weakness of the left lower extremity. She was admitted with a presumed MS flare and treatment was initiated with high-dose Solu-Medrol. Her treatment was completed and she was pending discharge while a SNF was being located. On 10/6 she attempted to get out of bed alone, after which she began to complain of severe left knee pain. It is not entirely clear if she fell or not. Plain film x-ray revealed a medial distal femur fracture on the left. Given the complex nature of her fracture the patient was transferred to [facility] for surgical correction.   Consultants: Orthopedics  Procedures: ORIF left distal femur fracture, 10/8  Antibiotics: Anti-infectives (From admission, onward)   Start     Dose/Rate Route Frequency Ordered Stop   02/04/20 2200  ceFAZolin (ANCEF) IVPB 2g/100 mL premix        2 g 200 mL/hr over 30 Minutes Intravenous Every 8 hours 02/04/20 1753 02/06/20 1012   02/04/20 1505  vancomycin (VANCOCIN) powder  Status:  Discontinued          As needed 02/04/20 1505 02/04/20 1637   02/04/20 1100  ceFAZolin (ANCEF) IVPB 2g/100 mL premix        2 g 200 mL/hr over 30 Minutes Intravenous On call to O.R. 02/04/20 1059 02/04/20 1401      Subjective/Interval History: Patient currently denies any pain.  She is waiting on an appropriate nursing facility for her to go to to get rehab.  Denies any shortness of breath.  No nausea vomiting.   Assessment/Plan:  Mildly displaced comminuted spiral fracture involving the distal left femur Seen by orthopedic surgery.  Underwent ORIF.  Patient to be nonweightbearing to the left leg.  Okay for TDWB if assists  with mobilization.  Pain is reasonably well controlled.  Waiting on SNF.  History of multiple sclerosis Patient completed 5 days of high-dose Solu-Medrol while at [facility].  Seems to be in remission currently.  Acute kidney injury on chronic kidney disease stage IIIa Resolved with volume resuscitation at [facility].  Here in this facility.  Normocytic anemia Likely due to chronic disease.  No acute blood loss noted.  Essential hypertension Continue antihypertensives.  Blood pressure is reasonably well controlled.  Chronic neurogenic bladder Has chronic indwelling Foley.  Obstructive sleep apnea Patient does not use CPAP at home.  Abnormal UA Possibly colonization due to chronic indwelling Foley.  Asymptomatic.  Obesity Estimated body mass index is 46.66 kg/m as calculated from the following:   Height as of this encounter: 5\' 4"  (1.626 m).   Weight as of this encounter: 123.3 kg.   DVT Prophylaxis: Lovenox Code Status: Full code Family Communication: Discussed with the patient.  No family at bedside Disposition Plan: SNF for short-term rehab  Status is: Inpatient  Remains inpatient appropriate because:Unsafe d/c plan   Dispo: The patient is from: Home              Anticipated d/c is to: SNF              Anticipated d/c date is: 1 day              Patient currently is medically stable to d/c.  Medications:  Scheduled: . acetaminophen  1,000 mg Oral Q8H  . amLODipine  10 mg Oral Daily  . atorvastatin  10 mg Oral QHS  . baclofen  10 mg Oral TID  . Chlorhexidine Gluconate Cloth  6 each Topical Q0600  . clonazePAM  0.5 mg Oral BID  . DULoxetine  30 mg Oral Daily  . enoxaparin (LOVENOX) injection  40 mg Subcutaneous Q24H  . gabapentin  600 mg Oral TID  . hydrALAZINE  50 mg Oral Q8H  . lisinopril  10 mg Oral Daily  . mometasone-formoterol  2 puff Inhalation BID  . vortioxetine HBr  5 mg Oral Daily   Continuous: . methocarbamol (ROBAXIN) IV     [facility]:ENIDPOEUMPNTIRW,  HYDROmorphone (DILAUDID) injection, methocarbamol (ROBAXIN) IV, methocarbamol, ondansetron, oxyCODONE, oxyCODONE   Objective:  Vital Signs  Vitals:   02/08/20 1900 02/08/20 2100 02/09/20 0500 02/09/20 0808  BP: (!) 110/52  111/67   Pulse: 70  76 61  Resp: 16  15 16   Temp: 98.3 F (36.8 C)  98.2 F (36.8 C)   TempSrc: Oral  Oral   SpO2: 99% 99% 99% 98%  Weight:      Height:        Intake/Output Summary (Last 24 hours) at 02/09/2020 1046 Last data filed at 02/09/2020 0809 Gross per 24 hour  Intake 720 ml  Output 3700 ml  Net -2980 ml   Filed Weights   02/06/20 2110  Weight: 123.3 kg    General appearance: Awake alert.  In no distress Resp: Clear to auscultation bilaterally.  Normal effort Cardio: S1-S2 is normal regular.  No S3-S4.  No rubs murmurs or bruit GI: Abdomen is soft.  Nontender nondistended.  Bowel sounds are present normal.  No masses organomegaly Extremities: No edema.  No bruising noted over the left lower extremity Neurologic: No focal neurological deficits.    Lab Results:  Data Reviewed: I have personally reviewed following labs and imaging studies  CBC: Recent Labs  Lab 02/04/20 0612 02/05/20 0232 02/06/20 0036 02/07/20 0647  WBC 7.4 13.9* 10.0 8.9  HGB 9.0* 8.9* 8.0* 9.2*  HCT 29.2* 27.7* 25.3* 29.5*  MCV 89.3 89.4 88.8 91.3  PLT 208 263 216 431    Basic Metabolic Panel: Recent Labs  Lab 02/04/20 0612 02/05/20 0232 02/06/20 0036  NA 139 135 137  K 4.6 4.7 4.3  CL 103 97* 101  CO2 28 28 27   GLUCOSE 109* 128* 111*  BUN 27* 23 21  CREATININE 1.13* 1.06* 1.04*  CALCIUM 8.9 8.7* 8.7*    GFR: Estimated Creatinine Clearance: 66.2 mL/min (A) (by C-G formula based on SCr of 1.04 mg/dL (H)).  CBG: Recent Labs  Lab 02/02/20 2127 02/03/20 0721 02/03/20 1114 02/03/20 1640 02/03/20 2053  GLUCAP 111* 80 126* 112* 141*     Recent Results (from the past 240 hour(s))  Surgical pcr screen     Status: Abnormal   Collection Time:  02/04/20 12:20 PM   Specimen: Nasal Mucosa; Nasal Swab  Result Value Ref Range Status   MRSA, PCR POSITIVE (A) NEGATIVE Final    Comment: RESULT CALLED TO, READ BACK BY AND VERIFIED WITH: Eulogio Ditch RN 14:35 02/04/20 ([email or web address])    Staphylococcus aureus POSITIVE (A) NEGATIVE Final    Comment: (NOTE) The Xpert SA Assay (FDA approved for NASAL specimens in patients 42 years of age and older), is one component of a comprehensive surveillance program. It is not intended to diagnose infection nor to guide or monitor treatment.  Performed at [facility], [place] [address] [place], [place] [address]       Radiology Studies: No results found.     LOS: 5 days   Banesa Tristan [employer] on [email or web address]  02/09/2020, 10:46 AM

## 2020-02-09 NOTE — Plan of Care (Signed)
  Problem: Education: Goal: Knowledge of General Education information will improve Description: Including pain rating scale, medication(s)/side effects and non-pharmacologic comfort measures 02/09/2020 2241 by Trixie Deis, RN Outcome: Progressing 02/09/2020 2240 by Trixie Deis, RN Outcome: Progressing   Problem: Activity: Goal: Risk for activity intolerance will decrease 02/09/2020 2241 by Trixie Deis, RN Outcome: Progressing 02/09/2020 2240 by Trixie Deis, RN Outcome: Progressing   Problem: Pain Managment: Goal: General experience of comfort will improve Outcome: Progressing   Problem: Safety: Goal: Ability to remain free from injury will improve Outcome: Progressing   Problem: Skin Integrity: Goal: Risk for impaired skin integrity will decrease Outcome: Progressing

## 2020-02-09 NOTE — TOC Progression Note (Addendum)
Transition of Care [facility]) - Progression Note    Patient Details  Name: [name] MRN: [id number] Date of Birth: 11/21/1950  Transition of Care [facility]) CM/SW [place], [place] Phone Number: 02/09/2020, 12:02 PM  Clinical Narrative:     3:52pm- Pt auth number is [id number], Josem Kaufmann is good from 10/13- 10/15. [facility] admit pt in the morning. CSW requested new covid test from MD. Pts passr number is [id number].  12:02- [facility], CSW confirmed that [address] will have bed for pt when ready. Pts Navi reference number is [id number], clinicals have been sent in. Insurance auth pending.  Expected Discharge Plan: [place] Barriers to Discharge: Continued Medical Work up, SNF Pending bed offer  Expected Discharge Plan and Services Expected Discharge Plan: [place] Choice: [place] arrangements for the past 2 months: Single Family Home                                       Social Determinants of Health (SDOH) Interventions    Readmission Risk Interventions Readmission Risk Prevention Plan 01/03/2020 04/22/2019 02/04/2019  Transportation Screening Complete Complete -  PCP or Specialist Appt within 3-5 Days - - -  [place] or [place] - - Complete  Social Work Consult for [place] Planning/Counseling - - Complete  Palliative Care Screening - - Not Applicable  Medication Review Press photographer) Complete Referral to Pharmacy -  PCP or Specialist appointment within 3-5 days of discharge Complete Complete -  [place] or [place] Complete Complete -  SW Recovery Care/Counseling Consult Complete Complete -  Palliative Care Screening Not Applicable Not Applicable -  [place] - Complete -  Some recent data might be hidden   Emeterio Reeve, Latanya Presser, [place] Social Worker [phone]

## 2020-02-09 NOTE — Progress Notes (Signed)
Physical Therapy Treatment Patient Details Name: [name] MRN: [id number] DOB: 1950-05-18 Today's Date: 02/09/2020    History of Present Illness 69 year old with a history of HTN, COPD, OSA, MS, chronic neurogenic bladder with indwelling Foley catheter, depression, and follicular lymphoma who presented to the [facility] ED with weakness of the left lower extremity.  She was admitted with a presumed MS flare and treatment was initiated with high-dose Solu-Medrol.  Her treatment was completed and she was pending discharge while a SNF was being located.  On 10/6 she attempted to get out of bed alone and fell, after which she began to complain of severe left knee pain.  Plain film x-ray revealed a medial distal femur fracture on the left. Pt underwent ORIF 10/8    PT Comments    Pt making slow progress toward goals, able to tolerate sitting EOB ~10 minutes and vitals stable during session (see below), but continues to need up to +62modA for bed mobility and seated lateral scooting along EOB. Pt unable to perform lateral seated scoot into chair due to urgency for bowel movement. Pt able to roll with less assist (min guard) using rails this session but remains dependent for peri-care. Pt continues to benefit from PT services to progress toward functional mobility goals. Continue to recommend SNF level of rehab.  Follow Up Recommendations  SNF     Equipment Recommendations  None recommended by PT    Recommendations for Other Services       Precautions / Restrictions Precautions Precautions: Fall Restrictions Weight Bearing Restrictions: Yes LLE Weight Bearing: Non weight bearing    Mobility  Bed Mobility Overal bed mobility: Needs Assistance Bed Mobility: Supine to Sit;Sit to Supine Rolling: Min guard   Supine to sit: Mod assist;+2 for physical assistance;HOB elevated Sit to supine: Mod assist;+2 for physical assistance;HOB elevated   General bed mobility comments: pt able to use bed  features more this session and needing less assist to raise trunk  Transfers Overall transfer level: Needs assistance   Transfers: Lateral/Scoot Transfers          Lateral/Scoot Transfers: +2 physical assistance;Mod assist (transfer pad assist) General transfer comment: pt performed lateral seated scoot toward [facility] but unable to attempt chair transfer due to sudden urgency for bowel movement  Ambulation/Gait                 Stairs             Wheelchair Mobility    Modified Rankin (Stroke Patients Only)       Balance Overall balance assessment: Needs assistance Sitting-balance support: Single extremity supported Sitting balance-Leahy Scale: Fair Sitting balance - Comments: fair static sitting, no LOB                                    Cognition Arousal/Alertness: Awake/alert Behavior During Therapy: Agitated;Flat affect Overall Cognitive Status: No family/caregiver present to determine baseline cognitive functioning Area of Impairment: Safety/judgement;Awareness;Problem solving                         Safety/Judgement: Decreased awareness of safety;Decreased awareness of deficits   Problem Solving: Difficulty sequencing;Requires verbal cues;Requires tactile cues General Comments: Pt perseverating on relationship with spouse and more agitated as pain becomes more prominent during mobility      Exercises      General Comments  Pertinent Vitals/Pain Pain Assessment: 0-10 Pain Score: 5  Pain Location: left leg  Pain Descriptors / Indicators: Grimacing;Discomfort;Guarding Pain Intervention(s): Monitored during session;Repositioned  BP supine: 133/55 (76) BP seated: 153/71 (91) HR 81 SpO2 WNL  Home Living                      Prior Function            PT Goals (current goals can now be found in the care plan section) Acute Rehab PT Goals Patient Stated Goal: to get moving PT Goal Formulation: With  patient Time For Goal Achievement: 02/19/20 Potential to Achieve Goals: Fair Progress towards PT goals: Progressing toward goals    Frequency    Min 2X/week      PT Plan Current plan remains appropriate    Co-evaluation              AM-PAC PT "6 Clicks" Mobility   Outcome Measure  Help needed turning from your back to your side while in a flat bed without using bedrails?: A Little Help needed moving from lying on your back to sitting on the side of a flat bed without using bedrails?: A Lot Help needed moving to and from a bed to a chair (including a wheelchair)?: Total Help needed standing up from a chair using your arms (e.g., wheelchair or bedside chair)?: Total Help needed to walk in hospital room?: Total Help needed climbing 3-5 steps with a railing? : Total 6 Click Score: 9    End of Session   Activity Tolerance: Patient tolerated treatment well;Other (comment) (limited by need for bed pan) Patient left: in bed;with call bell/phone within reach;with bed alarm set;with SCD's reapplied Nurse Communication: Mobility status PT Visit Diagnosis: Difficulty in walking, not elsewhere classified (R26.2);Muscle weakness (generalized) (M62.81);Other abnormalities of gait and mobility (R26.89)     Time: 6283-6629 PT Time Calculation (min) (ACUTE ONLY): 24 min  Charges:  $Therapeutic Activity: 23-37 mins                     Maille Halliwell P., PTA Acute Rehabilitation Services Pager: [phone] Office: [place] 02/09/2020, 5:33 PM

## 2020-02-10 DIAGNOSIS — I10 Essential (primary) hypertension: Secondary | ICD-10-CM | POA: Diagnosis not present

## 2020-02-10 LAB — BASIC METABOLIC PANEL
Anion gap: 10 (ref 5–15)
BUN: 19 mg/dL (ref 8–23)
CO2: 24 mmol/L (ref 22–32)
Calcium: 9 mg/dL (ref 8.9–10.3)
Chloride: 102 mmol/L (ref 98–111)
Creatinine, Ser: 1.23 mg/dL — ABNORMAL HIGH (ref 0.44–1.00)
GFR, Estimated: 45 mL/min — ABNORMAL LOW (ref >60)
Glucose, Bld: 111 mg/dL — ABNORMAL HIGH (ref 70–99)
Potassium: 3.8 mmol/L (ref 3.5–5.1)
Sodium: 136 mmol/L (ref 135–145)

## 2020-02-10 LAB — CBC
HCT: 27.4 % — ABNORMAL LOW (ref 36.0–46.0)
Hemoglobin: 8.5 g/dL — ABNORMAL LOW (ref 12.0–15.0)
MCH: 27.7 pg (ref 26.0–34.0)
MCHC: 31 g/dL (ref 30.0–36.0)
MCV: 89.3 fL (ref 80.0–100.0)
Platelets: 224 10*3/uL (ref 150–400)
RBC: 3.07 MIL/uL — ABNORMAL LOW (ref 3.87–5.11)
RDW: 15.8 % — ABNORMAL HIGH (ref 11.5–15.5)
WBC: 8.4 10*3/uL (ref 4.0–10.5)
nRBC: 0 % (ref 0.0–0.2)

## 2020-02-10 MED ORDER — CLONAZEPAM 0.5 MG PO TABS
0.5000 mg | ORAL_TABLET | Freq: Two times a day (BID) | ORAL | 0 refills | Status: DC
Start: 2020-02-10 — End: 2022-01-12

## 2020-02-10 MED ORDER — BACLOFEN 10 MG PO TABS
10.0000 mg | ORAL_TABLET | Freq: Two times a day (BID) | ORAL | 0 refills | Status: DC
Start: 2020-02-10 — End: 2021-03-26

## 2020-02-10 NOTE — Discharge Summary (Signed)
[facility]  Physician Discharge Summary   Patient ID: [name] MRN: [id number] DOB/AGE: 09/21/1950 69 y.o.  Admit date: 02/04/2020 Discharge date: 02/10/2020  PCP: Kirk Ruths, MD  DISCHARGE DIAGNOSES:  Mildly displaced comminuted spiral fracture involving the distal left femur status post ORIF History of multiple sclerosis Chronic kidney disease stage IIIa Normocytic anemia Essential hypertension Neurogenic bladder with chronic indwelling Foley. Obstructive sleep apnea Obesity  RECOMMENDATIONS FOR OUTPATIENT FOLLOW UP: 1. CBC and basic metabolic panel in 1 week 2. Patient has chronic indwelling Foley.   Home Health: Patient going to SNF Equipment/Devices: None  CODE STATUS: Full code  DISCHARGE CONDITION: Fair  Diet recommendation: Low-sodium  INITIAL HISTORY: 69 yo with a history of HTN, COPD, OSA, MS, chronic neurogenic bladder with indwelling Foley catheter, depression, and follicular lymphoma who presented to the [facility] ED with weakness of the left lower extremity. She was admitted with a presumed MS flare and treatment was initiated with high-dose Solu-Medrol. Her treatment was completed and she was pending discharge while a SNF was being located. On 10/6 she attempted to get out of bed alone, after which she began to complain of severe left knee pain. It is not entirely clear if she fell or not. Plain film x-ray revealed a medial distal femur fracture on the left. Given the complex nature of her fracture the patient was transferred to [facility] for surgical correction.  Consultations:  Orthopedics  Procedures: ORIF left distal femur fracture, 10/8   HOSPITAL COURSE:   Mildly displaced comminuted spiral fracture involving the distal left femur Seen by orthopedic surgery.  Underwent ORIF.  Patient to be nonweightbearing to the left leg.  Okay for TDWB with assists with mobilization.  Pain is reasonably well controlled.    Lovenox for  DVT prophylaxis.  History of multiple sclerosis Patient completed 5 days of high-dose Solu-Medrol while at [facility].  Seems to be in remission currently.  Acute kidney injury on chronic kidney disease stage IIIa Resolved with volume resuscitation at [facility].  Renal function has been stable.   Normocytic anemia Likely due to chronic disease.  No acute blood loss noted.  Essential hypertension Continue antihypertensives.    Chronic neurogenic bladder Has chronic indwelling Foley.  Obstructive sleep apnea Patient does not use CPAP at home.  Abnormal UA Possibly colonization due to chronic indwelling Foley.  Asymptomatic.  Obesity Estimated body mass index is 46.66 kg/m as calculated from the following:   Height as of this encounter: 5\' 4"  (1.626 m).   Weight as of this encounter: 123.3 kg.  Overall stable.  Okay for discharge to SNF today.   PERTINENT LABS:  The results of significant diagnostics from this hospitalization (including imaging, microbiology, ancillary and laboratory) are listed below for reference.    Microbiology: Recent Results (from the past 240 hour(s))  Surgical pcr screen     Status: Abnormal   Collection Time: 02/04/20 12:20 PM   Specimen: Nasal Mucosa; Nasal Swab  Result Value Ref Range Status   MRSA, PCR POSITIVE (A) NEGATIVE Final    Comment: RESULT CALLED TO, READ BACK BY AND VERIFIED WITH: Eulogio Ditch RN 14:35 02/04/20 ([email or web address])    Staphylococcus aureus POSITIVE (A) NEGATIVE Final    Comment: (NOTE) The Xpert SA Assay (FDA approved for NASAL specimens in patients 71 years of age and older), is one component of a comprehensive surveillance program. It is not intended to diagnose infection nor to guide or monitor treatment. Performed at [facility],  [address], [place], [place] [address]   SARS Coronavirus 2 by RT PCR (hospital order, performed in [facility] hospital lab) Nasopharyngeal Nasopharyngeal Swab     Status: None    Collection Time: 02/09/20  5:58 PM   Specimen: Nasopharyngeal Swab  Result Value Ref Range Status   SARS Coronavirus 2 NEGATIVE NEGATIVE Final    Comment: Performed at [facility], [place] [address], [place], [place] [address]     Labs:   Basic Metabolic Panel: Recent Labs  Lab 02/04/20 0612 02/05/20 0232 02/06/20 0036 02/10/20 0133  NA 139 135 137 136  K 4.6 4.7 4.3 3.8  CL 103 97* 101 102  CO2 28 28 27 24   GLUCOSE 109* 128* 111* 111*  BUN 27* 23 21 19   CREATININE 1.13* 1.06* 1.04* 1.23*  CALCIUM 8.9 8.7* 8.7* 9.0   CBC: Recent Labs  Lab 02/04/20 0612 02/05/20 0232 02/06/20 0036 02/07/20 0647 02/10/20 0133  WBC 7.4 13.9* 10.0 8.9 8.4  HGB 9.0* 8.9* 8.0* 9.2* 8.5*  HCT 29.2* 27.7* 25.3* 29.5* 27.4*  MCV 89.3 89.4 88.8 91.3 89.3  PLT 208 263 216 264 224    CBG: Recent Labs  Lab 02/03/20 1114 02/03/20 1640 02/03/20 2053  GLUCAP 126* 112* 141*     IMAGING STUDIES DG Knee 1-2 Views Left  Result Date: 02/03/2020 CLINICAL DATA:  69 year old female with left knee pain. Fall approximately 9 days ago. EXAM: LEFT KNEE - 1-2 VIEW COMPARISON:  Left hip series 01/26/2020. FINDINGS: Spiral fracture of the distal femur metadiaphysis tracking into the intercondylar notch with mild displacement. Small joint effusion. Patella appears to remain intact. Proximal tibia and fibula appear intact. IMPRESSION: Mildly displaced, comminuted spiral fracture of the distal left femur extending into the intercondylar notch. Electronically Signed   By: Genevie Ann M.D.   On: 02/03/2020 11:45   CT HEAD WO CONTRAST  Result Date: 01/26/2020 CLINICAL DATA:  Weakness. EXAM: CT HEAD WITHOUT CONTRAST TECHNIQUE: Contiguous axial images were obtained from the base of the skull through the vertex without intravenous contrast. COMPARISON:  June 20, 2019 FINDINGS: Brain: There is moderate severity cerebral atrophy with widening of the extra-axial spaces and ventricular dilatation. There are areas  of decreased attenuation within the white matter tracts of the supratentorial brain, consistent with microvascular disease changes. Vascular: No hyperdense vessel or unexpected calcification. Skull: Normal. Negative for fracture or focal lesion. Sinuses/Orbits: No acute finding. Other: None. IMPRESSION: 1. Generalized cerebral atrophy. 2. No acute intracranial abnormality. Electronically Signed   By: Virgina Norfolk M.D.   On: 01/26/2020 16:27   CT KNEE LEFT WO CONTRAST  Result Date: 02/03/2020 CLINICAL DATA:  Distal femur fracture. EXAM: CT OF THE LEFT KNEE WITHOUT CONTRAST TECHNIQUE: Multidetector CT imaging of the left knee was performed according to the standard protocol. Multiplanar CT image reconstructions were also generated. COMPARISON:  Left knee x-rays from same day. FINDINGS: Bones/Joint/Cartilage Acute oblique fracture of the distal femoral metadiaphysis extending from the medial cortex into the knee joint through the intercondylar notch and mesial aspect of the lateral femoral condyle. The fracture is displaced up to 1.1 cm posteromedially at the proximal margin. No additional fracture. No dislocation. Mitral compartment of joint space narrowing with small marginal osteophytes. Osteopenia. Small joint effusion. Ligaments Ligaments are suboptimally evaluated by CT. Muscles and Tendons Grossly intact. Soft tissue No fluid collection or hematoma.  No soft tissue mass. IMPRESSION: 1. Acute mildly displaced oblique fracture of the distal femoral metadiaphysis extending into  the knee joint through the intercondylar notch and mesial aspect of the lateral femoral condyle. Electronically Signed   By: Titus Dubin M.D.   On: 02/03/2020 17:17   MR BRAIN W WO CONTRAST  Result Date: 01/27/2020 CLINICAL DATA:  Multiple sclerosis, new event. Left lower extremity weakness. EXAM: MRI HEAD WITHOUT AND WITH CONTRAST TECHNIQUE: Multiplanar, multiecho pulse sequences of the brain and surrounding structures were  obtained without and with intravenous contrast. CONTRAST:  26mL GADAVIST GADOBUTROL 1 MMOL/ML IV SOLN COMPARISON:  Head MRI 11/09/2018 FINDINGS: Brain: No acute infarct, mass, midline shift, or extra-axial fluid collection is identified. A chronic microhemorrhage near the genu of the left internal capsule is unchanged. Moderate cerebral atrophy is unchanged. Extensive T2 and FLAIR hyperintensity in the cerebral white matter bilaterally is unchanged including confluent periventricular signal abnormality and multiple juxtacortical lesions. There is diffuse FLAIR hyperintensity along the callososeptal interface with diffuse callosal thinning. There is no abnormal enhancement or restricted diffusion. A chronic left cerebellar infarct is unchanged. There is moderate cerebral atrophy. Vascular: Major intracranial vascular flow voids are preserved. Skull and upper cervical spine: Unremarkable bone marrow signal. Sinuses/Orbits: Unremarkable orbits. Trace bilateral mastoid fluid. Clear paranasal sinuses. Other: None. IMPRESSION: Unchanged extensive cerebral white matter disease consistent with multiple sclerosis. No evidence of active demyelination or other acute intracranial abnormality. Electronically Signed   By: Logan Bores M.D.   On: 01/27/2020 18:52   DG Chest Port 1 View  Result Date: 01/26/2020 CLINICAL DATA:  Weakness. EXAM: PORTABLE CHEST 1 VIEW COMPARISON:  November 19, 2019 FINDINGS: Mild diffuse chronic appearing increased lung markings are seen. There is no evidence of acute infiltrate, pleural effusion or pneumothorax. The heart size and mediastinal contours are within normal limits. The visualized skeletal structures are unremarkable. IMPRESSION: No acute or active cardiopulmonary disease. Electronically Signed   By: Virgina Norfolk M.D.   On: 01/26/2020 16:24   DG Knee Complete 4 Views Left  Result Date: 02/04/2020 CLINICAL DATA:  ORIF, distal femoral fracture EXAM: LEFT KNEE - COMPLETE 4+ VIEW; DG  C-ARM 1-60 MIN COMPARISON:  Radiographs and CT images 02/03/2020 FINDINGS: Total of 15 sequential fluoroscopic images. Initial images redemonstrate the distal femoral metadiaphyseal fracture with extension into the intercondylar notch and through the lateral femoral condyle. There is initial placement of 2 fully threaded fixation screws transfixing the fracture fragment with a medial to lateral course. Subsequent placement of a medial plate and screw fixation construct is seen. Essentially anatomic alignment post fixation without acute complication. Expected postsurgical soft tissue changes are noted including intra-articular gas. IMPRESSION: Intraoperative fluoroscopic images during ORIF of the distal femoral metadiaphyseal fracture with a centrally anatomic alignment post fixation and absence of acute operative complication. Electronically Signed   By: Lovena Le M.D.   On: 02/04/2020 18:43   DG Knee Left Port  Result Date: 02/04/2020 CLINICAL DATA:  Post ORIF, distal left femur fracture EXAM: PORTABLE LEFT KNEE - 1-2 VIEW COMPARISON:  Intraoperative images performed 02/04/2020, preoperative CT and radiography 02/03/2020 FINDINGS: Postoperative images depict placement of 2 fully threaded transcondylar screws as well as a medial plate and screw fixation construct transfixing the oblique fracture of the distal femur with intercondylar extension. There is near anatomic alignment post operatively within the limitations of these slightly suboptimal AP and lateral projections. No acute complication. Expected postsurgical soft tissue changes including soft tissue gas and swelling. Vascular calcium noted in the soft tissues. IMPRESSION: ORIF of the oblique fracture of the distal femur with intercondylar extension.  No evidence of hardware complication. Electronically Signed   By: Lovena Le M.D.   On: 02/04/2020 18:46   DG Abd 2 Views  Result Date: 01/26/2020 CLINICAL DATA:  Left hip pain, weakness, fall EXAM:  X-RAY ABDOMEN 2 VIEWS COMPARISON:  None. FINDINGS: Nonobstructive bowel gas pattern. No free air or organomegaly. No confluent opacity within the lungs. No effusions. Heart is borderline in size. Scoliosis and degenerative changes in the thoracolumbar spine. Posterior fusion changes in the lower lumbar spine. IMPRESSION: No acute findings. Electronically Signed   By: Rolm Baptise M.D.   On: 01/26/2020 16:45   DG C-Arm 1-60 Min  Result Date: 02/04/2020 CLINICAL DATA:  ORIF, distal femoral fracture EXAM: LEFT KNEE - COMPLETE 4+ VIEW; DG C-ARM 1-60 MIN COMPARISON:  Radiographs and CT images 02/03/2020 FINDINGS: Total of 15 sequential fluoroscopic images. Initial images redemonstrate the distal femoral metadiaphyseal fracture with extension into the intercondylar notch and through the lateral femoral condyle. There is initial placement of 2 fully threaded fixation screws transfixing the fracture fragment with a medial to lateral course. Subsequent placement of a medial plate and screw fixation construct is seen. Essentially anatomic alignment post fixation without acute complication. Expected postsurgical soft tissue changes are noted including intra-articular gas. IMPRESSION: Intraoperative fluoroscopic images during ORIF of the distal femoral metadiaphyseal fracture with a centrally anatomic alignment post fixation and absence of acute operative complication. Electronically Signed   By: Lovena Le M.D.   On: 02/04/2020 18:43   DG Hip Unilat W or Wo Pelvis 2-3 Views Left  Result Date: 01/26/2020 CLINICAL DATA:  Left hip pain after fall EXAM: DG HIP (WITH OR WITHOUT PELVIS) 2-3V LEFT COMPARISON:  None. FINDINGS: No acute bony abnormality. Specifically, no fracture, subluxation, or dislocation. IMPRESSION: No acute bony abnormality. Electronically Signed   By: Rolm Baptise M.D.   On: 01/26/2020 16:44    DISCHARGE EXAMINATION: Vitals:   02/09/20 1429 02/09/20 2100 02/09/20 2105 02/10/20 0500  BP: (!)  127/59 (!) 104/55  109/60  Pulse: 73 66  68  Resp:      Temp: 98.5 F (36.9 C) 98.5 F (36.9 C)  98.6 F (37 C)  TempSrc: Oral Oral  Oral  SpO2: 100% 98% 98% 96%  Weight:      Height:       General appearance: Awake alert.  In no distress Resp: Clear to auscultation bilaterally.  Normal effort Cardio: S1-S2 is normal regular.  No S3-S4.  No rubs murmurs or bruit GI: Abdomen is soft.  Nontender nondistended.  Bowel sounds are present normal.  No masses organomegaly     DISPOSITION: SNF  Discharge Instructions    Call MD for:  difficulty breathing, headache or visual disturbances   Complete by: As directed    Call MD for:  extreme fatigue   Complete by: As directed    Call MD for:  persistant dizziness or light-headedness   Complete by: As directed    Call MD for:  persistant nausea and vomiting   Complete by: As directed    Call MD for:  severe uncontrolled pain   Complete by: As directed    Call MD for:  temperature >100.4   Complete by: As directed    Discharge instructions   Complete by: As directed    Please review instructions on the discharge summary.  You were cared for by a hospitalist during your hospital stay. If you have any questions about your discharge medications or the care you received  while you were in the hospital after you are discharged, you can call the unit and asked to speak with the hospitalist on call if the hospitalist that took care of you is not available. Once you are discharged, your primary care physician will handle any further medical issues. Please note that NO REFILLS for any discharge medications will be authorized once you are discharged, as it is imperative that you return to your primary care physician (or establish a relationship with a primary care physician if you do not have one) for your aftercare needs so that they can reassess your need for medications and monitor your lab values. If you do not have a primary care physician, you can  call [phone] for a physician referral.   Increase activity slowly   Complete by: As directed    No wound care   Complete by: As directed        Allergies as of 02/10/2020   No Known Allergies     Medication List    TAKE these medications   acetaminophen 500 MG tablet Commonly known as: TYLENOL Take 1,000 mg by mouth every 8 (eight) hours as needed for moderate pain.   amLODipine 5 MG tablet Commonly known as: NORVASC Take 5 mg by mouth daily.   atorvastatin 10 MG tablet Commonly known as: LIPITOR Take 10 mg by mouth at bedtime.   baclofen 10 MG tablet Commonly known as: LIORESAL Take 1 tablet (10 mg total) by mouth 2 (two) times daily. What changed: when to take this   budesonide-formoterol 160-4.5 MCG/ACT inhaler Commonly known as: SYMBICORT Inhale 2 puffs into the lungs 2 (two) times daily.   clonazePAM 0.5 MG tablet Commonly known as: KLONOPIN Take 1 tablet (0.5 mg total) by mouth 2 (two) times daily. What changed:   medication strength  how much to take   cyanocobalamin 1000 MCG tablet Take 1,000 mcg by mouth daily.   docusate sodium 100 MG capsule Commonly known as: COLACE Take 1 capsule (100 mg total) by mouth 2 (two) times daily as needed.   DULoxetine 60 MG capsule Commonly known as: CYMBALTA Take 1 capsule (60 mg total) by mouth every morning. What changed:   how much to take  when to take this   enoxaparin 40 MG/0.4ML injection Commonly known as: LOVENOX Inject 0.4 mLs (40 mg total) into the skin daily.   famotidine 10 MG tablet Commonly known as: PEPCID Take 10 mg by mouth daily.   gabapentin 600 MG tablet Commonly known as: NEURONTIN Take 600 mg by mouth 3 (three) times daily.   hydrALAZINE 50 MG tablet Commonly known as: APRESOLINE Take 50 mg by mouth 3 (three) times daily.   lisinopril 20 MG tablet Commonly known as: ZESTRIL Take 0.5 tablets (10 mg total) by mouth daily for 10 days.   magnesium oxide 400 MG  tablet Commonly known as: MAG-OX Take 400 mg by mouth daily.   multivitamin with minerals Tabs tablet Take 1 tablet by mouth daily.   Myrbetriq 50 MG Tb24 tablet Generic drug: mirabegron ER TAKE ONE TABLET BY MOUTH ONCE DAILY What changed:   how much to take  how to take this  when to take this  additional instructions   oxyCODONE 5 MG immediate release tablet Commonly known as: Oxy IR/ROXICODONE Take 1 tablet (5 mg total) by mouth every 4 (four) hours as needed for moderate pain (pain rated 4-6).   polyethylene glycol powder 17 GM/SCOOP powder Commonly known as: GLYCOLAX/MIRALAX Take 17  g by mouth daily as needed for mild constipation.   QUEtiapine Fumarate 150 MG 24 hr tablet Commonly known as: SEROQUEL XR Take 150 mg by mouth at bedtime.   Teriflunomide 14 MG Tabs Take 14 mg by mouth daily.   Trintellix 5 MG Tabs tablet Generic drug: vortioxetine HBr Take 5 mg by mouth daily.         Follow-up Information    Haddix, Thomasene Lot, MD Follow up in 2 week(s).   Specialty: Orthopedic Surgery Contact information: [place] [address] [phone]        Kirk Ruths, MD. Schedule an appointment as soon as possible for a visit in 3 week(s).   Specialty: Internal Medicine Contact information: [place] [address] [phone]               TOTAL DISCHARGE TIME: 44 minutes  [place]  [facility] Pager on [email or web address]  02/10/2020, 10:00 AM

## 2020-02-10 NOTE — Progress Notes (Signed)
At 1255 Report given to nurse at [facility] at this time.   At 1246 Pt discharging to SNF, [facility].  Copy of instructions printed and sent with transporters for facility.  Pt d/c'd with belongings, transported by [place].

## 2020-02-10 NOTE — TOC Transition Note (Signed)
Transition of Care [facility]) - CM/SW Discharge Note   Patient Details  Name: [name] MRN: [id number] Date of Birth: [birth year]  Transition of Care [facility] CM/SW Contact:  Emeterio Reeve, [place] Phone Number: 02/10/2020, 11:46 AM   Clinical Narrative:     Pt will discharge to [facility] via [place]. Pts husband [name] and daughter [name] have been informed. [facility] has been called.  Nurse to call report to [phone].  Final next level of care: Skilled Nursing Facility Barriers to Discharge: Barriers Resolved   Patient Goals and CMS Choice Patient states their goals for this hospitalization and ongoing recovery are:: "get walking again" CMS Medicare.gov Compare Post Acute Care list provided to:: Patient Choice offered to / list presented to : Patient  Discharge Placement              Patient chooses bed at: [facility] Patient to be transferred to facility by: [facility] Name of family member notified: [name] (husband)  and [name] [facility]) Patient and family notified of of transfer: 02/10/20  Discharge Plan and Services     Post Acute Care Choice: [place]                               Social Determinants of Health (SDOH) Interventions     Readmission Risk Interventions Readmission Risk Prevention Plan 01/03/2020 04/22/2019 02/04/2019  Transportation Screening Complete Complete -  PCP or Specialist Appt within 3-5 Days - - -  [place] or [place] - - Complete  Social Work Consult for [place] Planning/Counseling - - Complete  Palliative Care Screening - - Not Applicable  Medication Review Press photographer) Complete Referral to Pharmacy -  PCP or Specialist appointment within 3-5 days of discharge Complete Complete -  [place] or Home Care Consult Complete Complete -  SW Recovery Care/Counseling Consult Complete Complete -  Palliative Care Screening Not Applicable Not Applicable -  [place] - Complete -  Some recent data might be  hidden    Emeterio Reeve, Latanya Presser, [place] Social Worker [phone]

## 2020-03-02 ENCOUNTER — Ambulatory Visit: Payer: Medicare Other

## 2020-03-07 ENCOUNTER — Inpatient Hospital Stay: Payer: Medicare Other | Admitting: Oncology

## 2020-03-07 ENCOUNTER — Inpatient Hospital Stay: Payer: Medicare Other | Attending: Oncology

## 2020-03-16 ENCOUNTER — Emergency Department: Admission: EM | Admit: 2020-03-16 | Payer: Medicare Other | Source: Home / Self Care

## 2020-03-16 ENCOUNTER — Other Ambulatory Visit: Payer: Self-pay

## 2020-03-16 ENCOUNTER — Ambulatory Visit
Admission: RE | Admit: 2020-03-16 | Discharge: 2020-03-16 | Disposition: A | Discharge status: Home / Self Care | Payer: Medicare Other | Source: Ambulatory Visit | Attending: Oncology | Admitting: Oncology

## 2020-03-16 ENCOUNTER — Other Ambulatory Visit: Payer: Self-pay | Admitting: Oncology

## 2020-03-16 DIAGNOSIS — C8293 Follicular lymphoma, unspecified, intra-abdominal lymph nodes: Secondary | ICD-10-CM | POA: Diagnosis not present

## 2020-03-16 LAB — POCT I-STAT CREATININE: Creatinine, Ser: 1 mg/dL (ref 0.44–1.00)

## 2020-03-16 MED ORDER — IOHEXOL 300 MG/ML  SOLN
100.0000 mL | Freq: Once | INTRAMUSCULAR | Status: AC | PRN
  Administered 2020-03-16: 100 mL via INTRAVENOUS

## 2020-04-19 ENCOUNTER — Ambulatory Visit (INDEPENDENT_AMBULATORY_CARE_PROVIDER_SITE_OTHER): Payer: Medicare Other | Admitting: Urology

## 2020-04-19 ENCOUNTER — Encounter: Payer: Self-pay | Admitting: Urology

## 2020-04-19 ENCOUNTER — Other Ambulatory Visit: Payer: Self-pay

## 2020-04-19 VITALS — BP 118/80 | HR 90 | Ht 64.0 in | Wt 260.0 lb

## 2020-04-19 DIAGNOSIS — N312 Flaccid neuropathic bladder, not elsewhere classified: Secondary | ICD-10-CM | POA: Diagnosis not present

## 2020-04-19 MED ORDER — MYRBETRIQ 50 MG PO TB24: ORAL_TABLET | ORAL | 0 refills | Status: DC

## 2020-04-19 NOTE — Progress Notes (Signed)
04/19/2020 10:58 AM   [name] 1951-03-17 [id number]  Referring provider: Kirk Ruths, MD [facility] [place],  [place] [address]  Chief Complaint  Patient presents with  . Medication Refill    HPI: 69 y.o. female presents for follow-up.   Chronic urinary retention secondary to multiple sclerosis  Unable to perform intermittent catheterization and has had an indwelling Foley catheter for many years  Last seen here July 2019  Status post ORIF of a comminuted spiral fracture distal left femur October 2021  Discharge to SNF  Has been on Myrbetriq for bladder spasms and needed a refill  CT abdomen pelvis 03/18/2020 for lymphoma follow-up showed a right renal cyst and no hydronephrosis   PMH: Past Medical History:  Diagnosis Date  . Abdominal aortic atherosclerosis ([place]) 11/11/2016  . ADHD   . Anxiety   . COPD (chronic obstructive pulmonary disease) ([place])   . Depression    major depressive  . Dyspnea    doe  . Edema    left leg  . Follicular lymphoma ([place])    B Cell  . Follicular lymphoma grade II ([place])   . Hypertension   . Hypotension    idiopathic  . Kyphoscoliosis and scoliosis 11/26/2011  . Morbid obesity ([place]) 01/05/2016  . Multiple sclerosis ([place])   . Multiple sclerosis ([place])    1980's  . Neuromuscular disorder ([place])   . Obstructive and reflux uropathy    foley  . Pain    atypical facial  . Peripheral vascular disease of lower extremity with ulceration ([place]) 11/08/2015  . Skin ulcer ([place]) 11/08/2015  . Weakness    generalized. has MS    Surgical History: Past Surgical History:  Procedure Laterality Date  . BACK SURGERY N/A 2002  . CYST EXCISION     lower back  . INGUINAL LYMPH NODE BIOPSY Left 07/04/2016   Procedure: INGUINAL LYMPH NODE BIOPSY;  Surgeon: Christene Lye, MD;  Location: [facility];  Service: General;  Laterality: Left;  . ORIF FEMUR FRACTURE Left 02/04/2020   Procedure: OPEN  REDUCTION INTERNAL FIXATION (ORIF) DISTAL FEMUR FRACTURE;  Surgeon: Shona Needles, MD;  Location: [facility];  Service: Orthopedics;  Laterality: Left;  . PORTACATH PLACEMENT N/A 07/22/2016   Procedure: INSERTION PORT-A-CATH;  Surgeon: Christene Lye, MD;  Location: [facility];  Service: General;  Laterality: N/A;  . TONSILLECTOMY AND ADENOIDECTOMY    . TUBAL LIGATION      Home Medications:  Allergies as of 04/19/2020   No Known Allergies     Medication List       Accurate as of April 19, 2020 10:58 AM. If you have any questions, ask your nurse or doctor.        acetaminophen 500 MG tablet Commonly known as: TYLENOL Take 1,000 mg by mouth every 8 (eight) hours as needed for moderate pain.   amLODipine 5 MG tablet Commonly known as: NORVASC Take 5 mg by mouth daily.   amoxicillin-clavulanate 875-125 MG tablet Commonly known as: AUGMENTIN Take 1 tablet by mouth 2 (two) times daily.   atorvastatin 10 MG tablet Commonly known as: LIPITOR Take 10 mg by mouth at bedtime.   baclofen 10 MG tablet Commonly known as: LIORESAL Take 1 tablet (10 mg total) by mouth 2 (two) times daily.   budesonide-formoterol 160-4.5 MCG/ACT inhaler Commonly known as: SYMBICORT Inhale 2 puffs into the lungs 2 (two) times daily.   clonazePAM 0.5 MG tablet  Commonly known as: KLONOPIN Take 1 tablet (0.5 mg total) by mouth 2 (two) times daily.   cyanocobalamin 1000 MCG tablet Take 1,000 mcg by mouth daily.   docusate sodium 100 MG capsule Commonly known as: COLACE Take 1 capsule (100 mg total) by mouth 2 (two) times daily as needed.   DULoxetine 60 MG capsule Commonly known as: CYMBALTA Take 1 capsule (60 mg total) by mouth every morning. What changed:   how much to take  when to take this   enoxaparin 40 MG/0.4ML injection Commonly known as: LOVENOX Inject 0.4 mLs (40 mg total) into the skin daily.   famotidine 10 MG tablet Commonly known as: PEPCID Take 10 mg by mouth  daily.   gabapentin 600 MG tablet Commonly known as: NEURONTIN Take 600 mg by mouth 3 (three) times daily.   hydrALAZINE 50 MG tablet Commonly known as: APRESOLINE Take 50 mg by mouth 3 (three) times daily.   hydrOXYzine 25 MG tablet Commonly known as: ATARAX/VISTARIL Take 25 mg by mouth 3 (three) times daily as needed.   lisinopril 20 MG tablet Commonly known as: ZESTRIL Take 0.5 tablets (10 mg total) by mouth daily for 10 days.   magnesium oxide 400 MG tablet Commonly known as: MAG-OX Take 400 mg by mouth daily.   Melatonin 5 MG Caps Take by mouth.   multivitamin with minerals Tabs tablet Take 1 tablet by mouth daily.   Myrbetriq 50 MG Tb24 tablet Generic drug: mirabegron ER TAKE ONE TABLET BY MOUTH ONCE DAILY What changed:   how much to take  how to take this  when to take this  additional instructions   oxyCODONE 5 MG immediate release tablet Commonly known as: Oxy IR/ROXICODONE Take 1 tablet (5 mg total) by mouth every 4 (four) hours as needed for moderate pain (pain rated 4-6).   polyethylene glycol powder 17 GM/SCOOP powder Commonly known as: GLYCOLAX/MIRALAX Take 17 g by mouth daily as needed for mild constipation.   QUEtiapine Fumarate 150 MG 24 hr tablet Commonly known as: SEROQUEL XR Take 150 mg by mouth at bedtime.   vortioxetine HBr 5 MG Tabs tablet Commonly known as: TRINTELLIX Take 5 mg by mouth daily.       Allergies: No Known Allergies  Family History: Family History  Problem Relation Age of Onset  . COPD Mother   . Diabetes Mother   . Heart failure Mother   . Alcohol abuse Father   . Kidney disease Father   . Kidney failure Father   . Arthritis Sister   . CAD Maternal Grandmother   . Stroke Maternal Grandfather   . Arthritis Sister   . Mental illness Sister   . Arthritis Brother     Social History:  reports that she quit smoking about 4 years ago. Her smoking use included cigarettes. She started smoking about 24 years  ago. She has a 20.00 pack-year smoking history. She has never used smokeless tobacco. She reports current drug use. Drug: Marijuana. She reports that she does not drink alcohol.   Physical Exam: BP 118/80   Pulse 90   Ht 5\' 4"  (1.626 m)   Wt 260 lb (117.9 kg)   BMI 44.63 kg/m   Constitutional:  Alert, No acute distress. HEENT:  AT, moist mucus membranes.  Trachea midline, no masses. Cardiovascular: No clubbing, cyanosis, or edema. Respiratory: Normal respiratory effort, no increased work of breathing. Skin: No rashes, bruises or suspicious lesions. Psychiatric: Normal mood and affect.    Assessment &  Plan:    1.  Neurogenic bladder  Chronic indwelling Foley  Myrbetriq was refilled  Recent upper tract imaging showed no hydronephrosis  Annual follow-up   Abbie Sons, MD  [facility] [address] [place], [place] [address] [phone]

## 2020-07-20 ENCOUNTER — Encounter: Payer: Medicare Other | Attending: Physician Assistant | Admitting: Physician Assistant

## 2020-07-20 ENCOUNTER — Other Ambulatory Visit: Payer: Self-pay

## 2020-07-20 DIAGNOSIS — J449 Chronic obstructive pulmonary disease, unspecified: Secondary | ICD-10-CM | POA: Insufficient documentation

## 2020-07-20 DIAGNOSIS — N183 Chronic kidney disease, stage 3 unspecified: Secondary | ICD-10-CM | POA: Insufficient documentation

## 2020-07-20 DIAGNOSIS — G35 Multiple sclerosis: Secondary | ICD-10-CM | POA: Insufficient documentation

## 2020-07-20 DIAGNOSIS — L89623 Pressure ulcer of left heel, stage 3: Secondary | ICD-10-CM | POA: Insufficient documentation

## 2020-07-20 DIAGNOSIS — I129 Hypertensive chronic kidney disease with stage 1 through stage 4 chronic kidney disease, or unspecified chronic kidney disease: Secondary | ICD-10-CM | POA: Insufficient documentation

## 2020-07-21 NOTE — Progress Notes (Addendum)
SEHER, SCHLAGEL ([id number]) Visit Report for 07/20/2020 Chief Complaint Document Details Patient Name: [name] Date of Service: 07/20/2020 1:00 PM Medical Record Number: [id number] Patient Account Number: [id number] Date of Birth/Sex: 04/04/1951 (70 y.o. F) Treating RN: [name] Primary Care Provider: [name] Other Clinician: [name] Referring Provider: [name] Treating Provider/Extender: [name] in Treatment: 0 Information Obtained from: Patient Chief Complaint Left Heel Pressure Ulcer Electronic Signature(s) Signed: 07/20/2020 2:06:21 PM By: Worthy Keeler PA-C Entered By: Worthy Keeler on 07/20/2020 14:06:21 [name] ([id number]) -------------------------------------------------------------------------------- Debridement Details Patient Name: [name] Date of Service: 07/20/2020 1:00 PM Medical Record Number: [id number] Patient Account Number: [id number] Date of Birth/Sex: January 24, 1951 (70 y.o. F) Treating RN: [name] Primary Care Provider: [name] Other Clinician: [name] Referring Provider: [name] Treating Provider/Extender: [name] in Treatment: 0 Debridement Performed for Wound #12 Left Calcaneus Assessment: Performed By: Physician Tommie Sams., PA-C Debridement Type: Debridement Level of Consciousness (Pre- Awake and Alert procedure): Pre-procedure Verification/Time Out Yes - 14:10 Taken: Start Time: 14:10 Pain Control: Lidocaine 4% Topical Solution Total Area Debrided (L x W): 2.5 (cm) x 2.6 (cm) = 6.5 (cm) Tissue and other material Viable, Non-Viable, Eschar, Slough, Subcutaneous, Slough debrided: Level: Skin/Subcutaneous Tissue Debridement Description: Excisional Instrument: Curette Bleeding: Minimum Hemostasis Achieved: Pressure Response to Treatment: Procedure was tolerated well Level of Consciousness (Post- Awake and  Alert procedure): Post Debridement Measurements of Total Wound Length: (cm) 2.7 Stage: Category/Stage III Width: (cm) 3.2 Depth: (cm) 0.3 Volume: (cm) 2.036 Character of Wound/Ulcer Post Debridement: Stable Post Procedure Diagnosis Same as Pre-procedure Electronic Signature(s) Signed: 07/20/2020 4:40:53 PM By: Charlett Nose RN Signed: 07/20/2020 6:00:52 PM By: Worthy Keeler PA-C Entered By: Georges Mouse, Minus Breeding on 07/20/2020 14:15:48 [name] ([id number]) -------------------------------------------------------------------------------- HPI Details Patient Name: [name] Date of Service: 07/20/2020 1:00 PM Medical Record Number: [id number] Patient Account Number: [id number] Date of Birth/Sex: February 02, 1951 (70 y.o. F) Treating RN: [name] Primary Care Provider: [name] Other Clinician: [name] Referring Provider: [name] Treating Provider/Extender: [name] in Treatment: 0 History of Present Illness Location: left and right lower extremity and dorsum of the right foot Quality: Patient reports experiencing a sharp pain to affected area(s). Severity: Patient rates the pain to be a 8 out of 10 especially with palpation Duration: Patient has had the wound for > 6 months prior to seeking treatment at the wound center Timing: Pain in wound is Intermittent in severity but persistent Context: The wound appeared gradually over time Modifying Factors: Other treatment(s) tried include: symptomatic treatment as advised by her PCP Associated Signs and Symptoms: Patient reports having increase swelling iin her bilateral lower extremities HPI Description: 70 year old with a past medical history significant for [name], urinary incontinence, and obesity. She has been seen in the wound clinic before for lower extremity ulcerations treated with compression therapy. she is also known to have hypertension, peripheral vascular disease, COPD,  obstructive sleep apnea, lumbar radiculopathy, kyphoscoliosis, urinary issues and tobacco abuse. Smokes a packet of cigarettes a day was recently seen at the [facility] for swelling of her legs and feet with a ulceration on the dorsum of the right foot which has been there for about 6 months. she was recently in the ER about a month ago where she was seen for shortness of breath and swelling of the legs and a chest x-ray was within normal limits had an increase in her BNP  and was given Lasix and put on doxycycline for a mild cellulitis and possible UTI. Wounds aresmaller today 11/13/15. Debrided and will continue Santyl. 12/21/2015 -- she was admitted to the hospital overnight on 12/18/2015 and diagnosed with urinary retention and cellulitis of the left lower leg. is past to take clindamycin and use Santyl for her wounds. 01/15/2016 -- come back to see [place] for almost a month and continues to be noncompliant with her dressings 01/30/16 patient presents today for a follow-up visit concerning her ongoing bilateral lower extremity wounds. We have not seen her for the past 2 weeks although we are supposed to be seen her for weekly visits. She currently has a Foley catheter. She tells me that the wounds are intensely painful especially with pressure and palpation at this point in time. Fortunately she is having no interval signs or symptoms of systemic infection but unfortunately the wounds have not improved dramatically since we last saw her. She does have home health coming to take care of her as well. She is currently not in any compression wraps. 02/06/16 ON evaluation today patient continues to experience discomfort regarding her bilateral lower extremity ulcers. She has continued to tolerate the dressing changes at this point in time and continues to have a Foley catheter as well. Fortunately she is back this week in the past she has been somewhat noncompliant with follow-ups I'm glad to  see her today. She tells me that her pain level varies but can be as high as a 7 out of 10 with manipulation of the wound. She tells me that she used to be on oxycodone which was managed by the pain clinic although she is no longer on that as she tells me that she was actually smoking marijuana at the time and when this was found out they discontinued her pain medication. She no longer is taking anything pain medication wise and she also does not smoke cigarettes nor marijuana at this point. 02/12/16; this is a patient I don't believe I have previously seen. She has multiple sclerosis. She has 4 punched-out areas on the anterior lateral left leg 2 areas on the right these are all in the same condition. Reasonably small [dime size] wounds each was some depth. Surface of these wounds does not look particularly healthy as there is adherent slough. There is no evidence of surrounding infection or inflammation. ABIs in this clinic were 0.87 on the right and 0.81 on the left. She is listed as having PAD and is a smoker. Not a diabetic 02/20/16 patient I gave antibiotics to last week for erythema around both wound sites on the left lateral leg and right lateral leg. This appears to be a lot better. One of the areas on the left leg is healed however she still has 3 punched-out open areas on the left lateral calf and one on the right lateral calf and one on the dorsal foot. She is an ex-smoker quitting 1 month ago. ABIs in this clinic were 0.87 on the right 0.81 on the left 03/12/16; this is a patient I really don't have a good sense of. It would appear for the first 5 or 6 weeks of her stay in this clinic she was cared for by Dr. Con Memos. She appeared on my schedule in mid October and I saw her twice. She has not been here however in 3 weeks. She has advanced Wound Care at home where she lives with her husband. She has multiple sclerosis. I saw her  the first time she had 4 punched-out small wounds on the  anterior lateral left leg it appears that she is now down to 2. She also had wounds on the lateral and medial aspect of her right leg which were also small and punched out. The only one that remains is on the right lateral. Because of the nature of her wound I went ahead and ordered formal arterial studies this showed an ABI of 1 on the right and one on the left. TBI of 1.4 on the right and 0.79 on the left. She had normal waveforms. She was in the emergency room on 02/28/16; there is a noted edema of her left leg after a fall. She had a duplex ultrasound that showed no evidence for an acute DVT from the left groin to the popliteal fossa. The study was limited in the calf veins due to edema. Also noticeable that she had a left inguinal enlarged lymph node up to 6.2 cm. She also had a left knee x-ray that showed no acute findings and a right foot x-ray that showed soft tissue swelling but no radiographic evidence of osteomyelitis. The patient does stop smoking 03-19-16 [name] presents today for evaluation of her bilateral lower extremity ulcerations. She states that she has not smoked in several weeks. She denies any pain or discomfort to bilateral lower extremities, tolerating compression therapy as ordered. 03/26/16; I have not seen this patient in 2 weeks however she has done very well with improvements in the areas in question. After we obtain normal arterial studies, increasing the 4-layer compression really seems to look done a nice job here. She has one open area on the left lateral leg and one on the medial right leg. These have filled in nicely and are now superficial wounds that showed epithelialized. I note the left inguinal lymph node at 6.2 cm. I may need to refer this back to the patient's primary doctor. 04/03/16; patient has 2 remaining wounds 1 on the left lateral leg and one on the right medial leg. Both of filled in nicely since we are able to increase her from a 3 layer to a 4 layer  compression. I am also following up on the lymph node notable on her left leg DVT rule out in November 04/10/16; area on the right lateral leg is healed. The area on the left leg is still open but looks considerably better with healthy granulation and less wound area. I12/20/17; last week we healed the patient doubt with regards to the wound on the right leg to her on compression stocking 20-30 mmHg as it turns out I don't think she had a stocking, predictably therefore she has reopened. The left leg as well as the wound on the lateral aspect. Been generally small line 04/24/16; we healed out her right leg 2 weeks ago to her own 20-30 mm compression stockings although as it turns out she didn't have these [name], [name] H. ([id number]) and did not purchase some apparently because of financial issues. The left leg wound is on the lateral aspect. Both of these wounds are just about healed. 05/01/16; her wounds on the left leg are healed out today. The area on the right leg is also healed. Vitamin diligent effort of our staff we have not been able to get any form of compression stocking to this patient. The orders for juxtalite's were given to home health this never materialized. We ordered compression stockings I don't think she was able to afford these.  We had a discussion today with both the patient and her husband without these, will accumulate edema and the wounds will simply reopen again. 05/14/16 READMISSION this is a patient we healed out 2 weeks ago. As bilateral lower extremity venous wounds probably some degree of lymphedema. At the beginning in November I did a duplex ultrasound of her left leg that was negative for DVT but did show a lymph node in the left inguinal area presumably reactive. We've been using compression to her lower extremities eventually healed all her wounds on her bilateral calves but we were not able to get any form of compression stockings for her in spite of intense  efforts of our staff. She returns today with reopening on the left leg did not the right she has several superficial areas anteriorly but an actual frank ulcer on the lateral left calf. This is about the size of a dime or smaller. She does not really complain of pain. The patient has a history of PAD however arterial studies we did in November were normal. Her ABIs and Doppler waveforms were both within the normal limits. The patient has a history of [name]. In discussing things with her today it would appear that the opening was noted 2 weeks ago according to the patient. We gave her Tubigrip stockings when she left the clinic 05/21/16; she has not yet had the duplex ultrasound of her thigh, this is booked for Thursday. The wound on the left lateral lower leg is improved 05/28/16; her duplex ultrasound of the left leg specifically her left thigh did not show a DVT however did show hypoechogenic enlarged left inguinal lymph nodes up to 6.9 x 3.7 x 6.8. This also showed in the one in November however maximal diameter was 6.2, at that point I thought these were reactive secondary to cellulitis however there is obviously a differential here. Her wound on the left lower leg is closed. She has a superficial open area on the base of her left fifth metatarsal head on the plantar foot. It looks as though she has some wrap injuries on the anterior leg 06/05/16; since she was last year she had a fall on 1/31. She was seen in the ER but sent back home. I think she is also been seen by her primary physician who picked up on the swelling and the lymphadenopathy. She is ordered a CT scan of the abdomen and pelvis however this will be without contrast because of stage IIIB renal insufficiency. Nevertheless this should be a start the workup to make sure there is not is more systemic problem here. She will probably need consideration of a biopsy of the lymph node in her inguinal area, this would need general surgery. As far as  her wounds are concern today the area on the left plantar foot is healed. She has a small weeping area on the left lower leg. The wrap injury from last week has largely Pardoxically there is a lot less edema in the left thigh. 06/12/16; CT scan is on Friday. She has 2 small open areas one on the lateral left lower leg and one on the anterior lower leg on the left. Both of these looks healthy. I reduced her compression last week to Kerlix and Coban this seems to have not a reasonable job 07/02/16; the patient is now in the skilled facility. I think this was arranged out of the ER when she fell at home although I'm not exactly sure. She is going for I  think a laparotomy for the pelvic mass next week. All the wounds in the left leg have healed Readmission: 07/20/2020 upon evaluation today patient presents for reevaluation here in clinic unfortunately she is having issues with a significant stage III pressure ulcer to her left heel. This is something that occurred when she was in the hospital sick. And subsequently though she does not appear to have any new injury she is still trying to recover from the pressure injury which was quite severe during that time. I do not see any signs of infection right now which is great news. With that being said she has been require some debridement to try to clear this up as much as possible. We need to get the wound VAC cleared so that she can see overall healing and improvement that were looking for. The patient does have a history of hypertension, chronic kidney disease stage III, COPD, and multiple sclerosis. Currently she has been trying to do what she can to keep pressure off of her heel while in bed and in other locations as well. Electronic Signature(s) Signed: 07/21/2020 1:34:23 PM By: Worthy Keeler PA-C Previous Signature: 07/21/2020 1:32:19 PM Version By: Worthy Keeler PA-C Entered By: Worthy Keeler on 07/21/2020 13:34:22 [name], [name]  ([id number]) -------------------------------------------------------------------------------- Physical Exam Details Patient Name: [name] Date of Service: 07/20/2020 1:00 PM Medical Record Number: [id number] Patient Account Number: [id number] Date of Birth/Sex: [birth year] (70 y.o. F) Treating RN: [name] Primary Care Provider: [name] Other Clinician: [name] Referring Provider: [name] Treating Provider/Extender: [name] Weeks in Treatment: 0 Constitutional sitting or standing blood pressure is within target range for patient.. pulse regular and within target range for patient.Marland Kitchen respirations regular, non- labored and within target range for patient.Marland Kitchen temperature within target range for patient.. Well-nourished and well-hydrated in no acute distress. Eyes conjunctiva clear no eyelid edema noted. pupils equal round and reactive to light and accommodation. Ears, Nose, Mouth, and Throat no gross abnormality of ear auricles or external auditory canals. normal hearing noted during conversation. mucus membranes moist. Respiratory normal breathing without difficulty. Cardiovascular 2+ dorsalis pedis/posterior tibialis pulses. no clubbing, cyanosis, significant edema, <3 sec cap refill. Musculoskeletal normal gait and posture. no significant deformity or arthritic changes, no loss or range of motion, no clubbing. Psychiatric this patient is able to make decisions and demonstrates good insight into disease process. Alert and Oriented x 3. pleasant and cooperative. Notes Upon inspection the patient's wound bed on the heel actually appears to be doing decently well which is great news. I think that she has no new pressure injury though unfortunately she does have a lot of necrotic debris this can have to be removed. I was able to perform a quite significant debridement today removing a tremendous amount of the necrotic tissue she had a excellent ABI on  the side and no signs of blood flow limitation. With all that being said post debridement the wound bed appeared to be better though not completely clean. We are going to need to address this going forward with a likely further debridement but again we will see how things go. Electronic Signature(s) Signed: 07/21/2020 1:33:25 PM By: Worthy Keeler PA-C Entered By: Worthy Keeler on 07/21/2020 13:33:25 [name] ([id number]) -------------------------------------------------------------------------------- Physician Orders Details Patient Name: [name] Date of Service: 07/20/2020 1:00 PM Medical Record Number: [id number] Patient Account Number: [id number] Date of Birth/Sex: Jun 06, 1950 (70 y.o. F) Treating RN: [name] Primary Care Provider:  [name] Other Clinician: [name] Referring Provider: [name] Treating Provider/Extender: [name] in Treatment: 0 Verbal / Phone Orders: No Diagnosis Coding ICD-10 Coding Code Description [phone] Pressure ulcer of left heel, stage 3 I10 Essential (primary) hypertension N18.30 Chronic kidney disease, stage 3 unspecified J44.9 Chronic obstructive pulmonary disease, unspecified G35 Multiple sclerosis Follow-up Appointments o Return Appointment in 1 week. [place]: - [facility] for wound care. May utilize formulary equivalent dressing for wound treatment orders unless otherwise specified. Home Health Nurse may visit PRN to address patientos wound care needs. o Scheduled days for dressing changes to be completed; exception, patient has scheduled wound care visit that day. - Mondays and Thursdays o **Please direct any NON-WOUND related issues/requests for orders to patient's Primary Care Physician. **If current dressing causes regression in wound condition, may D/C ordered dressing product/s and apply Normal Saline Moist Dressing daily until  next [place] or Other MD appointment. **Notify Wound Healing Center of regression in wound condition at [phone]. Edema Control - Lymphedema / Segmental Compressive Device / Other o Optional: One layer of unna paste to top of compression wrap (to act as an anchor). o 3 Layer Compression System for Lymphedema. Off-Loading o Open toe surgical shoe o Other: - Keep pressure off left heel Wound Treatment Wound #12 - Calcaneus Wound Laterality: Left Cleanser: Normal Saline 2 x Per Week/30 Days Discharge Instructions: Wash your hands with soap and water. Remove old dressing, discard into plastic bag and place into trash. Cleanse the wound with Normal Saline prior to applying a clean dressing using gauze sponges, not tissues or cotton balls. Do not scrub or use excessive force. Pat dry using gauze sponges, not tissue or cotton balls. Primary Dressing: IODOFLEX 0.9% Cadexomer Iodine Pad 2 x Per Week/30 Days Discharge Instructions: Apply Iodoflex to wound bed only, extra sent with patient Secondary Dressing: ABD Pad 5x9 (in/in) 2 x Per Week/30 Days Discharge Instructions: Cover with ABD pad Compression Wrap: Profore Lite LF 3 Multilayer Compression Bandaging System 2 x Per Week/30 Days Discharge Instructions: Apply 3 multi-layer wrap as prescribed. Electronic Signature(s) Signed: 07/20/2020 4:40:53 PM By: Georges Mouse, Minus Breeding RN Signed: 07/20/2020 6:00:52 PM By: Worthy Keeler PA-C Entered By: Georges Mouse, Minus Breeding on 07/20/2020 14:21:26 [name], [name] ([id number]) -------------------------------------------------------------------------------- Problem List Details Patient Name: [name] Date of Service: 07/20/2020 1:00 PM Medical Record Number: [id number] Patient Account Number: [id number] Date of Birth/Sex: [birth year] (70 y.o. F) Treating RN: [name] Primary Care Provider: [name] Other Clinician: [name] Referring Provider: [name] Treating Provider/Extender: [name] Weeks in Treatment: 0 Active Problems ICD-10 Encounter Code Description Active Date MDM Diagnosis [phone] Pressure ulcer of left heel, stage 3 07/20/2020 No Yes I10 Essential (primary) hypertension 07/20/2020 No Yes N18.30 Chronic kidney disease, stage 3 unspecified 07/20/2020 No Yes J44.9 Chronic obstructive pulmonary disease, unspecified 07/20/2020 No Yes G35 Multiple sclerosis 07/20/2020 No Yes Inactive Problems Resolved Problems Electronic Signature(s) Signed: 07/20/2020 2:06:03 PM By: Worthy Keeler PA-C Entered By: Worthy Keeler on 07/20/2020 14:06:03 [name], [name] ([id number]) -------------------------------------------------------------------------------- Progress Note Details Patient Name: [name] Date of Service: 07/20/2020 1:00 PM Medical Record Number: [id number] Patient Account Number: [id number] Date of Birth/Sex: [birth year] (70 y.o. F) Treating RN: [name] Primary Care Provider: [name] Other Clinician: [name] Referring Provider: [name] Treating Provider/Extender: [name] in Treatment: 0 Subjective Chief Complaint Information obtained from Patient Left Heel  Pressure Ulcer History of Present Illness (HPI) The following HPI elements were documented for the patient's wound: Location: left and right lower extremity and dorsum of the right foot Quality: Patient reports experiencing a sharp pain to affected area(s). Severity: Patient rates the pain to be a 8 out of 10 especially with palpation Duration: Patient has had the wound for > 6 months prior to seeking treatment at the wound center Timing: Pain in wound is Intermittent in severity but persistent Context: The wound appeared gradually over time Modifying Factors: Other treatment(s) tried include: symptomatic treatment as advised by her PCP Associated Signs and Symptoms: Patient reports having increase swelling  iin her bilateral lower extremities 70 year old with a past medical history significant for [name], urinary incontinence, and obesity. She has been seen in the wound clinic before for lower extremity ulcerations treated with compression therapy. she is also known to have hypertension, peripheral vascular disease, COPD, obstructive sleep apnea, lumbar radiculopathy, kyphoscoliosis, urinary issues and tobacco abuse. Smokes a packet of cigarettes a day was recently seen at the [facility] for swelling of her legs and feet with a ulceration on the dorsum of the right foot which has been there for about 6 months. she was recently in the ER about a month ago where she was seen for shortness of breath and swelling of the legs and a chest x-ray was within normal limits had an increase in her BNP and was given Lasix and put on doxycycline for a mild cellulitis and possible UTI. Wounds aresmaller today 11/13/15. Debrided and will continue Santyl. 12/21/2015 -- she was admitted to the hospital overnight on 12/18/2015 and diagnosed with urinary retention and cellulitis of the left lower leg. is past to take clindamycin and use Santyl for her wounds. 01/15/2016 -- come back to see [place] for almost a month and continues to be noncompliant with her dressings 01/30/16 patient presents today for a follow-up visit concerning her ongoing bilateral lower extremity wounds. We have not seen her for the past 2 weeks although we are supposed to be seen her for weekly visits. She currently has a Foley catheter. She tells me that the wounds are intensely painful especially with pressure and palpation at this point in time. Fortunately she is having no interval signs or symptoms of systemic infection but unfortunately the wounds have not improved dramatically since we last saw her. She does have home health coming to take care of her as well. She is currently not in any compression wraps. 02/06/16 ON evaluation today  patient continues to experience discomfort regarding her bilateral lower extremity ulcers. She has continued to tolerate the dressing changes at this point in time and continues to have a Foley catheter as well. Fortunately she is back this week in the past she has been somewhat noncompliant with follow-ups I'm glad to see her today. She tells me that her pain level varies but can be as high as a 7 out of 10 with manipulation of the wound. She tells me that she used to be on oxycodone which was managed by the pain clinic although she is no longer on that as she tells me that she was actually smoking marijuana at the time and when this was found out they discontinued her pain medication. She no longer is taking anything pain medication wise and she also does not smoke cigarettes nor marijuana at this point. 02/12/16; this is a patient I don't believe I have previously seen. She has multiple sclerosis. She  has 4 punched-out areas on the anterior lateral left leg 2 areas on the right these are all in the same condition. Reasonably small [dime size] wounds each was some depth. Surface of these wounds does not look particularly healthy as there is adherent slough. There is no evidence of surrounding infection or inflammation. ABIs in this clinic were 0.87 on the right and 0.81 on the left. She is listed as having PAD and is a smoker. Not a diabetic 02/20/16 patient I gave antibiotics to last week for erythema around both wound sites on the left lateral leg and right lateral leg. This appears to be a lot better. One of the areas on the left leg is healed however she still has 3 punched-out open areas on the left lateral calf and one on the right lateral calf and one on the dorsal foot. She is an ex-smoker quitting 1 month ago. ABIs in this clinic were 0.87 on the right 0.81 on the left 03/12/16; this is a patient I really don't have a good sense of. It would appear for the first 5 or 6 weeks of her stay in  this clinic she was cared for by Dr. Con Memos. She appeared on my schedule in mid October and I saw her twice. She has not been here however in 3 weeks. She has advanced Wound Care at home where she lives with her husband. She has multiple sclerosis. I saw her the first time she had 4 punched-out small wounds on the anterior lateral left leg it appears that she is now down to 2. She also had wounds on the lateral and medial aspect of her right leg which were also small and punched out. The only one that remains is on the right lateral. Because of the nature of her wound I went ahead and ordered formal arterial studies this showed an ABI of 1 on the right and one on the left. TBI of 1.4 on the right and 0.79 on the left. She had normal waveforms. She was in the emergency room on 02/28/16; there is a noted edema of her left leg after a fall. She had a duplex ultrasound that showed no evidence for an acute DVT from the left groin to the popliteal fossa. The study was limited in the calf veins due to edema. Also noticeable that she had a left inguinal enlarged lymph node up to 6.2 cm. She also had a left knee x-ray that showed no acute findings and a right foot x-ray that showed soft tissue swelling but no radiographic evidence of osteomyelitis. The patient does stop smoking 03-19-16 [name] presents today for evaluation of her bilateral lower extremity ulcerations. She states that she has not smoked in several weeks. She denies any pain or discomfort to bilateral lower extremities, tolerating compression therapy as ordered. 03/26/16; I have not seen this patient in 2 weeks however she has done very well with improvements in the areas in question. After we obtain normal arterial studies, increasing the 4-layer compression really seems to look done a nice job here. She has one open area on the left lateral leg and one on the medial right leg. These have filled in nicely and are now superficial wounds that  showed epithelialized. I note the left inguinal lymph node at 6.2 cm. I may need to refer this back to the patient's primary doctor. 04/03/16; patient has 2 remaining wounds 1 on the left lateral leg and one on the right medial leg. Both of  filled in nicely since we are able to increase her from a 3 layer to a 4 layer compression. I am also following up on the lymph node notable on her left leg DVT rule out in November [name], [name]. ([id number]) 04/10/16; area on the right lateral leg is healed. The area on the left leg is still open but looks considerably better with healthy granulation and less wound area. I12/20/17; last week we healed the patient doubt with regards to the wound on the right leg to her on compression stocking 20-30 mmHg as it turns out I don't think she had a stocking, predictably therefore she has reopened. The left leg as well as the wound on the lateral aspect. Been generally small line 04/24/16; we healed out her right leg 2 weeks ago to her own 20-30 mm compression stockings although as it turns out she didn't have these and did not purchase some apparently because of financial issues. The left leg wound is on the lateral aspect. Both of these wounds are just about healed. 05/01/16; her wounds on the left leg are healed out today. The area on the right leg is also healed. Vitamin diligent effort of our staff we have not been able to get any form of compression stocking to this patient. The orders for juxtalite's were given to home health this never materialized. We ordered compression stockings I don't think she was able to afford these. We had a discussion today with both the patient and her husband without these, will accumulate edema and the wounds will simply reopen again. 05/14/16 READMISSION this is a patient we healed out 2 weeks ago. As bilateral lower extremity venous wounds probably some degree of lymphedema. At the beginning in November I did a duplex ultrasound of  her left leg that was negative for DVT but did show a lymph node in the left inguinal area presumably reactive. We've been using compression to her lower extremities eventually healed all her wounds on her bilateral calves but we were not able to get any form of compression stockings for her in spite of intense efforts of our staff. She returns today with reopening on the left leg did not the right she has several superficial areas anteriorly but an actual frank ulcer on the lateral left calf. This is about the size of a dime or smaller. She does not really complain of pain. The patient has a history of PAD however arterial studies we did in November were normal. Her ABIs and Doppler waveforms were both within the normal limits. The patient has a history of [name]. In discussing things with her today it would appear that the opening was noted 2 weeks ago according to the patient. We gave her Tubigrip stockings when she left the clinic 05/21/16; she has not yet had the duplex ultrasound of her thigh, this is booked for Thursday. The wound on the left lateral lower leg is improved 05/28/16; her duplex ultrasound of the left leg specifically her left thigh did not show a DVT however did show hypoechogenic enlarged left inguinal lymph nodes up to 6.9 x 3.7 x 6.8. This also showed in the one in November however maximal diameter was 6.2, at that point I thought these were reactive secondary to cellulitis however there is obviously a differential here. Her wound on the left lower leg is closed. She has a superficial open area on the base of her left fifth metatarsal head on the plantar foot. It looks as though she  has some wrap injuries on the anterior leg 06/05/16; since she was last year she had a fall on 1/31. She was seen in the ER but sent back home. I think she is also been seen by her primary physician who picked up on the swelling and the lymphadenopathy. She is ordered a CT scan of the abdomen and pelvis  however this will be without contrast because of stage IIIB renal insufficiency. Nevertheless this should be a start the workup to make sure there is not is more systemic problem here. She will probably need consideration of a biopsy of the lymph node in her inguinal area, this would need general surgery. As far as her wounds are concern today the area on the left plantar foot is healed. She has a small weeping area on the left lower leg. The wrap injury from last week has largely Pardoxically there is a lot less edema in the left thigh. 06/12/16; CT scan is on Friday. She has 2 small open areas one on the lateral left lower leg and one on the anterior lower leg on the left. Both of these looks healthy. I reduced her compression last week to Kerlix and Coban this seems to have not a reasonable job 07/02/16; the patient is now in the skilled facility. I think this was arranged out of the ER when she fell at home although I'm not exactly sure. She is going for I think a laparotomy for the pelvic mass next week. All the wounds in the left leg have healed Readmission: 07/20/2020 upon evaluation today patient presents for reevaluation here in clinic unfortunately she is having issues with a significant stage III pressure ulcer to her left heel. This is something that occurred when she was in the hospital sick. And subsequently though she does not appear to have any new injury she is still trying to recover from the pressure injury which was quite severe during that time. I do not see any signs of infection right now which is great news. With that being said she has been require some debridement to try to clear this up as much as possible. We need to get the wound VAC cleared so that she can see overall healing and improvement that were looking for. The patient does have a history of hypertension, chronic kidney disease stage III, COPD, and multiple sclerosis. Currently she has been trying to do what she can to  keep pressure off of her heel while in bed and in other locations as well. Patient History Information obtained from Patient. Allergies No Known Drug Allergies Family History Diabetes - Mother, Hypertension - Mother,Father, Lung Disease - Mother, Stroke - Maternal Grandparents, Thyroid Problems - Siblings, No family history of Cancer, Heart Disease, Hereditary Spherocytosis, Kidney Disease, Seizures, Tuberculosis. Social History Former smoker, Marital Status - Married, Alcohol Use - Rarely, Drug Use - Current History - marijuana, Caffeine Use - Daily. Medical History Eyes Patient has history of Cataracts Denies history of Glaucoma, Optic Neuritis Ear/Nose/Mouth/Throat Denies history of Chronic sinus problems/congestion, Middle ear problems Hematologic/Lymphatic Denies history of Anemia, Hemophilia, Human Immunodeficiency Virus, Lymphedema, Sickle Cell Disease Respiratory Patient has history of Chronic Obstructive Pulmonary Disease (COPD), Sleep Apnea Denies history of Aspiration, Asthma, Pneumothorax, Tuberculosis Cardiovascular Patient has history of Hypertension Denies history of Angina, Arrhythmia, Congestive Heart Failure, Coronary Artery Disease, Deep Vein Thrombosis, Hypotension, Myocardial Infarction, Peripheral Arterial Disease, Peripheral Venous Disease, Phlebitis, Vasculitis Gastrointestinal Denies history of Cirrhosis , Colitis, Crohn s, Hepatitis A, Hepatitis B, Hepatitis  [name], [name] ([id number]) Endocrine Denies history of Type I Diabetes, Type II Diabetes Genitourinary Denies history of End Stage Renal Disease Immunological Denies history of Lupus Erythematosus, Raynaud s Integumentary (Skin) Denies history of History of Burn, History of pressure wounds Musculoskeletal Patient has history of Osteoarthritis Denies history of Gout, Rheumatoid Arthritis Neurologic Patient has history of Neuropathy Denies history of Dementia, Quadriplegia, Paraplegia, Seizure  Disorder Oncologic Denies history of Received Chemotherapy, Received Radiation Psychiatric Denies history of Anorexia/bulimia, Confinement Anxiety Hospitalization/Surgery History - tonsillectomy. - pilonidal cyst surgery. - spinal decompression w/ steel screws. Medical And Surgical History Notes Musculoskeletal [name] Degenerative Bone Disease Psychiatric depression Review of Systems (ROS) Constitutional Symptoms (General Health) Denies complaints or symptoms of Fatigue, Fever, Chills, Marked Weight Change. Eyes Denies complaints or symptoms of Dry Eyes, Vision Changes, Glasses / Contacts. Ear/Nose/Mouth/Throat Denies complaints or symptoms of Difficult clearing ears, Sinusitis. Hematologic/Lymphatic Denies complaints or symptoms of Bleeding / Clotting Disorders, Human Immunodeficiency Virus. Respiratory Denies complaints or symptoms of Chronic or frequent coughs, Shortness of Breath. Cardiovascular Denies complaints or symptoms of Chest pain, LE edema. Gastrointestinal Denies complaints or symptoms of Frequent diarrhea, Nausea, Vomiting. Endocrine Denies complaints or symptoms of Hepatitis, Thyroid disease, Polydypsia (Excessive Thirst). Genitourinary Denies complaints or symptoms of Kidney failure/ Dialysis, Incontinence/dribbling, CKD3 Immunological Denies complaints or symptoms of Hives, Itching. Integumentary (Skin) Complains or has symptoms of Wounds. Denies complaints or symptoms of Bleeding or bruising tendency, Breakdown, Swelling. Musculoskeletal Denies complaints or symptoms of Muscle Pain, Muscle Weakness. Neurologic Denies complaints or symptoms of Numbness/parasthesias, Focal/Weakness. Psychiatric Complains or has symptoms of Anxiety. Objective Constitutional sitting or standing blood pressure is within target range for patient.. pulse regular and within target range for patient.Marland Kitchen respirations regular, non- labored and within target range for patient.Marland Kitchen  temperature within target range for patient.. Well-nourished and well-hydrated in no acute distress. Vitals Time Taken: 1:15 PM, Height: 64 in, Source: Stated, Weight: 265 lbs, Source: Stated, BMI: 45.5, Temperature: 98.5 F, Pulse: 90 bpm, Respiratory Rate: 20 breaths/min, Blood Pressure: 108/56 mmHg. Eyes conjunctiva clear no eyelid edema noted. pupils equal round and reactive to light and accommodation. Limb, [name] H. ([id number]) Ears, Nose, Mouth, and Throat no gross abnormality of ear auricles or external auditory canals. normal hearing noted during conversation. mucus membranes moist. Respiratory normal breathing without difficulty. Cardiovascular 2+ dorsalis pedis/posterior tibialis pulses. no clubbing, cyanosis, significant edema, Musculoskeletal normal gait and posture. no significant deformity or arthritic changes, no loss or range of motion, no clubbing. Psychiatric this patient is able to make decisions and demonstrates good insight into disease process. Alert and Oriented x 3. pleasant and cooperative. General Notes: Upon inspection the patient's wound bed on the heel actually appears to be doing decently well which is great news. I think that she has no new pressure injury though unfortunately she does have a lot of necrotic debris this can have to be removed. I was able to perform a quite significant debridement today removing a tremendous amount of the necrotic tissue she had a excellent ABI on the side and no signs of blood flow limitation. With all that being said post debridement the wound bed appeared to be better though not completely clean. We are going to need to address this going forward with a likely further debridement but again we will see how things go. Integumentary (Hair, Skin) Wound #12 status is Open. Original cause of wound was Pressure Injury. The date acquired was: 06/27/2020. The wound is located on the Left Calcaneus.  The wound measures 2.5cm length x  2.6cm width x 0.2cm depth; 5.105cm^2 area and 1.021cm^3 volume. There is no tunneling or undermining noted. There is a medium amount of serosanguineous drainage noted. There is no granulation within the wound bed. There is a large (67-100%) amount of necrotic tissue within the wound bed including Adherent Slough. Assessment Active Problems ICD-10 Pressure ulcer of left heel, stage 3 Essential (primary) hypertension Chronic kidney disease, stage 3 unspecified Chronic obstructive pulmonary disease, unspecified Multiple sclerosis Procedures Wound #12 Pre-procedure diagnosis of Wound #12 is a Pressure Ulcer located on the Left Calcaneus . There was a Excisional Skin/Subcutaneous Tissue Debridement with a total area of 6.5 sq cm performed by Tommie Sams., PA-C. With the following instrument(s): Curette to remove Viable and Non-Viable tissue/material. Material removed includes Eschar, Subcutaneous Tissue, and Slough after achieving pain control using Lidocaine 4% Topical Solution. A time out was conducted at 14:10, prior to the start of the procedure. A Minimum amount of bleeding was controlled with Pressure. The procedure was tolerated well. Post Debridement Measurements: 2.7cm length x 3.2cm width x 0.3cm depth; 2.036cm^3 volume. Post debridement Stage noted as Category/Stage III. Character of Wound/Ulcer Post Debridement is stable. Post procedure Diagnosis Wound #12: Same as Pre-Procedure Pre-procedure diagnosis of Wound #12 is a Pressure Ulcer located on the Left Calcaneus . There was a Three Layer Compression Therapy Procedure with a pre-treatment ABI of 1.1 by [name], RN. Post procedure Diagnosis Wound #12: Same as Pre-Procedure Plan Follow-up Appointments: Return Appointment in 1 week. Home Health: [place]: [facility] [name], [name] ([id number]) [place] for wound care. May utilize formulary equivalent dressing for wound treatment orders unless  otherwise specified. Home Health Nurse may visit PRN to address patient s wound care needs. Scheduled days for dressing changes to be completed; exception, patient has scheduled wound care visit that day. - Mondays and Thursdays **Please direct any NON-WOUND related issues/requests for orders to patient's Primary Care Physician. **If current dressing causes regression in wound condition, may D/C ordered dressing product/s and apply Normal Saline Moist Dressing daily until next [place] or Other MD appointment. **Notify Wound Healing Center of regression in wound condition at [phone]. Edema Control - Lymphedema / Segmental Compressive Device / Other: Optional: One layer of unna paste to top of compression wrap (to act as an anchor). 3 Layer Compression System for Lymphedema. Off-Loading: Open toe surgical shoe Other: - Keep pressure off left heel WOUND #12: - Calcaneus Wound Laterality: Left Cleanser: Normal Saline 2 x Per Week/30 Days Discharge Instructions: Wash your hands with soap and water. Remove old dressing, discard into plastic bag and place into trash. Cleanse the wound with Normal Saline prior to applying a clean dressing using gauze sponges, not tissues or cotton balls. Do not scrub or use excessive force. Pat dry using gauze sponges, not tissue or cotton balls. Primary Dressing: IODOFLEX 0.9% Cadexomer Iodine Pad 2 x Per Week/30 Days Discharge Instructions: Apply Iodoflex to wound bed only, extra sent with patient Secondary Dressing: ABD Pad 5x9 (in/in) 2 x Per Week/30 Days Discharge Instructions: Cover with ABD pad Compression Wrap: Profore Lite LF 3 Multilayer Compression Bandaging System 2 x Per Week/30 Days Discharge Instructions: Apply 3 multi-layer wrap as prescribed. 1. I would recommend currently that we go ahead and initiate treatment with Iodoflex I think this is good to be a good option to try to help clean up the surface of the wound the patient is in  agreement the plan. 2. Also can use an ABD pad to help with padding this region. 3. I am also going to suggest a 3 layer compression wrap to help with edema control I think that is also good to be of benefit for her considering how she was swollen today. We will see patient back for reevaluation in 1 week here in the clinic. If anything worsens or changes patient will contact our office for additional recommendations. Electronic Signature(s) Signed: 07/21/2020 1:34:38 PM By: Worthy Keeler PA-C Previous Signature: 07/21/2020 1:33:50 PM Version By: Worthy Keeler PA-C Entered By: Worthy Keeler on 07/21/2020 13:34:38 [name] ([id number]) -------------------------------------------------------------------------------- ROS/PFSH Details Patient Name: [name] Date of Service: 07/20/2020 1:00 PM Medical Record Number: [id number] Patient Account Number: [id number] Date of Birth/Sex: 1950-08-09 (70 y.o. F) Treating RN: [name] Primary Care Provider: [name] Other Clinician: [name] Referring Provider: [name] Treating Provider/Extender: [name] in Treatment: 0 Information Obtained From Patient Constitutional Symptoms (General Health) Complaints and Symptoms: Negative for: Fatigue; Fever; Chills; Marked Weight Change Eyes Complaints and Symptoms: Negative for: Dry Eyes; Vision Changes; Glasses / Contacts Medical History: Positive for: Cataracts Negative for: Glaucoma; Optic Neuritis Ear/Nose/Mouth/Throat Complaints and Symptoms: Negative for: Difficult clearing ears; Sinusitis Medical History: Negative for: Chronic sinus problems/congestion; Middle ear problems Hematologic/Lymphatic Complaints and Symptoms: Negative for: Bleeding / Clotting Disorders; Human Immunodeficiency Virus Medical History: Negative for: Anemia; Hemophilia; Human Immunodeficiency Virus; Lymphedema; Sickle Cell Disease Respiratory Complaints and  Symptoms: Negative for: Chronic or frequent coughs; Shortness of Breath Medical History: Positive for: Chronic Obstructive Pulmonary Disease (COPD); Sleep Apnea Negative for: Aspiration; Asthma; Pneumothorax; Tuberculosis Cardiovascular Complaints and Symptoms: Negative for: Chest pain; LE edema Medical History: Positive for: Hypertension Negative for: Angina; Arrhythmia; Congestive Heart Failure; Coronary Artery Disease; Deep Vein Thrombosis; Hypotension; Myocardial Infarction; Peripheral Arterial Disease; Peripheral Venous Disease; Phlebitis; Vasculitis Gastrointestinal Complaints and Symptoms: Negative for: Frequent diarrhea; Nausea; Vomiting Medical History: Negative for: Cirrhosis ; Colitis; Crohnos; Hepatitis A; Hepatitis B; Hepatitis C Endocrine [name], [name] H. ([id number]) Complaints and Symptoms: Negative for: Hepatitis; Thyroid disease; Polydypsia (Excessive Thirst) Medical History: Negative for: Type I Diabetes; Type II Diabetes Genitourinary Complaints and Symptoms: Negative for: Kidney failure/ Dialysis; Incontinence/dribbling Review of System Notes: CKD3 Medical History: Negative for: End Stage Renal Disease Immunological Complaints and Symptoms: Negative for: Hives; Itching Medical History: Negative for: Lupus Erythematosus; Raynaudos Integumentary (Skin) Complaints and Symptoms: Positive for: Wounds Negative for: Bleeding or bruising tendency; Breakdown; Swelling Medical History: Negative for: History of Burn; History of pressure wounds Musculoskeletal Complaints and Symptoms: Negative for: Muscle Pain; Muscle Weakness Medical History: Positive for: Osteoarthritis Negative for: Gout; Rheumatoid Arthritis Past Medical History Notes: [name] Degenerative Bone Disease Neurologic Complaints and Symptoms: Negative for: Numbness/parasthesias; Focal/Weakness Medical History: Positive for: Neuropathy Negative for: Dementia; Quadriplegia; Paraplegia; Seizure  Disorder Psychiatric Complaints and Symptoms: Positive for: Anxiety Medical History: Negative for: Anorexia/bulimia; Confinement Anxiety Past Medical History Notes: depression Oncologic Medical History: Negative for: Received Chemotherapy; Received Radiation HBO Extended History Items [name], BILLINGER. ([id number]) Eyes: Cataracts Immunizations Pneumococcal Vaccine: Received Pneumococcal Vaccination: Yes Implantable Devices None Hospitalization / Surgery History Type of Hospitalization/Surgery tonsillectomy pilonidal cyst surgery spinal decompression w/ steel screws Family and Social History Cancer: No; Diabetes: Yes - Mother; Heart Disease: No; Hereditary Spherocytosis: No; Hypertension: Yes - Mother,Father; Kidney Disease: No; Lung Disease: Yes - Mother; Seizures: No; Stroke: Yes - Maternal Grandparents; Thyroid Problems: Yes - Siblings; Tuberculosis: No; Former smoker; Marital Status - Married;  Alcohol Use: Rarely; Drug Use: Current History - marijuana; Caffeine Use: Daily; Financial Concerns: Yes; Food, Clothing or Shelter Needs: No; Support System Lacking: No; Transportation Concerns: No Electronic Signature(s) Signed: 07/20/2020 4:40:53 PM By: Georges Mouse, Minus Breeding RN Signed: 07/20/2020 6:00:52 PM By: Worthy Keeler PA-C Signed: 07/21/2020 8:44:23 AM By: [name] Entered By: [name] on 07/20/2020 13:24:20 [name] ([id number]) -------------------------------------------------------------------------------- SuperBill Details Patient Name: [name] Date of Service: 07/20/2020 Medical Record Number: [id number] Patient Account Number: [id number] Date of Birth/Sex: 11-02-1950 (70 y.o. F) Treating RN: [name] Primary Care Provider: [name] Other Clinician: [name] Referring Provider: [name] Treating Provider/Extender: [name] in Treatment: 0 Diagnosis Coding ICD-10 Codes Code Description [phone]  Pressure ulcer of left heel, stage 3 I10 Essential (primary) hypertension N18.30 Chronic kidney disease, stage 3 unspecified J44.9 Chronic obstructive pulmonary disease, unspecified G35 Multiple sclerosis Facility Procedures CPT4 Code: 17494496 Description: [place] VISIT-LEV 3 EST PT Modifier: Quantity: 1 CPT4 Code: 75916384 Description: 11042 - DEB SUBQ TISSUE 20 SQ CM/< Modifier: Quantity: 1 CPT4 Code: Description: ICD-10 Diagnosis Description L89.623 Pressure ulcer of left heel, stage 3 Modifier: Quantity: CPT4 Code: 66599357 Description: (Facility Use Only) 29581LT - [place] LWR LT LEG Modifier: Quantity: 1 Physician Procedures CPT4 Code: 0177939 Description: WC PHYS LEVEL 3 o NEW PT Modifier: 25 Quantity: 1 CPT4 Code: Description: ICD-10 Diagnosis Description L89.623 Pressure ulcer of left heel, stage 3 I10 Essential (primary) hypertension N18.30 Chronic kidney disease, stage 3 unspecified J44.9 Chronic obstructive pulmonary disease, unspecified Modifier: Quantity: CPT4 Code: 0300923 Description: 11042 - WC PHYS SUBQ TISS 20 SQ CM Modifier: Quantity: 1 CPT4 Code: Description: ICD-10 Diagnosis Description L89.623 Pressure ulcer of left heel, stage 3 Modifier: Quantity: Electronic Signature(s) Signed: 07/20/2020 5:59:38 PM By: Worthy Keeler PA-C Previous Signature: 07/20/2020 4:40:53 PM Version By: Georges Mouse, Minus Breeding RN Entered By: Worthy Keeler on 07/20/2020 17:59:38

## 2020-07-21 NOTE — Progress Notes (Signed)
[name] ([id number]) Visit Report for 07/20/2020 Abuse/Suicide Risk Screen Details Patient Name: [name], [name] Date of Service: 07/20/2020 1:00 PM Medical Record Number: [id number] Patient Account Number: [id number] Date of Birth/Sex: 26-Oct-1950 (69 y.o. F) Treating RN: [name] Primary Care Chez Bulnes: [name] Other Clinician: [name] Referring Arthelia Callicott: [name] Treating Taelon Bendorf/Extender: [name] in Treatment: 0 Abuse/Suicide Risk Screen Items Answer ABUSE RISK SCREEN: Has anyone close to you tried to hurt or harm you recentlyo No Do you feel uncomfortable with anyone in your familyo No Has anyone forced you do things that you didnot want to doo No Electronic Signature(s) Signed: 07/20/2020 4:40:53 PM By: Georges Mouse, Minus Breeding RN Signed: 07/21/2020 8:44:23 AM By: [name] Entered By: [name] on 07/20/2020 13:24:31 [name] ([id number]) -------------------------------------------------------------------------------- Activities of Daily Living Details Patient Name: [name] Date of Service: 07/20/2020 1:00 PM Medical Record Number: [id number] Patient Account Number: [id number] Date of Birth/Sex: [birth year] (69 y.o. F) Treating RN: [name] Primary Care Pressley Barsky: [name] Other Clinician: [name] Referring Blayklee Mable: [name] Treating Bhavik Cabiness/Extender: [name] in Treatment: 0 Activities of Daily Living Items Answer Activities of Daily Living (Please select one for each item) Drive Automobile Not Able Take Medications Completely Able Use Telephone Completely Able Care for Appearance Need Assistance Use Toilet Completely Able Bath / Shower Need Assistance Dress Self Need Assistance Feed Self Completely Able Walk Need Assistance Get In / Out Bed Completely Able Housework Not Able Prepare Meals Not Able Handle Money Need Assistance Shop for Self Not  Able Electronic Signature(s) Signed: 07/20/2020 4:40:53 PM By: Charlett Nose RN Signed: 07/21/2020 8:44:23 AM By: [name] Entered By: [name] on 07/20/2020 13:25:42 [name] ([id number]) -------------------------------------------------------------------------------- Education Screening Details Patient Name: [name] Date of Service: 07/20/2020 1:00 PM Medical Record Number: [id number] Patient Account Number: [id number] Date of Birth/Sex: Jul 22, 1950 (69 y.o. F) Treating RN: [name] Primary Care Tiffani Kadow: [name] Other Clinician: [name] Referring Sharika Mosquera: [name] Treating [name]/Extender: [name] in Treatment: 0 Primary Learner Assessed: Patient Learning Preferences/Education Level/Primary Language Learning Preference: Explanation, Demonstration Highest Education Level: High School Preferred Language: English Cognitive Barrier Language Barrier: No Translator Needed: No Memory Deficit: No Emotional Barrier: No Cultural/Religious Beliefs Affecting Medical Care: No Physical Barrier Impaired Vision: No Impaired Hearing: No Decreased Hand dexterity: No Knowledge/Comprehension Knowledge Level: Medium Comprehension Level: Medium Ability to understand written instructions: Medium Ability to understand verbal instructions: Medium Motivation Anxiety Level: Calm Cooperation: Cooperative Education Importance: Acknowledges Need Interest in Health Problems: Asks Questions Perception: Coherent Willingness to Engage in Self-Management High Activities: Readiness to Engage in Self-Management High Activities: Electronic Signature(s) Signed: 07/20/2020 4:40:53 PM By: Charlett Nose RN Signed: 07/21/2020 8:44:23 AM By: [name] Entered By: [name] on 07/20/2020 13:26:35 [name]  ([id number]) -------------------------------------------------------------------------------- Fall Risk Assessment Details Patient Name: [name] Date of Service: 07/20/2020 1:00 PM Medical Record Number: [id number] Patient Account Number: [id number] Date of Birth/Sex: 1950/06/07 (69 y.o. F) Treating RN: [name] Primary Care Hall Birchard: [name] Other Clinician: [name] Referring Dimetri Armitage: [name] Treating Shabreka Coulon/Extender: [name] in Treatment: 0 Fall Risk Assessment Items Have you had 2 or more falls in the last 12 monthso 0 Yes Have you had any fall that resulted in injury in the last 12 monthso 0 No FALLS RISK SCREEN History of falling - immediate or within 3 months 0 No Secondary diagnosis (Do you have 2 or more medical  diagnoseso) 15 Yes Ambulatory aid None/bed rest/wheelchair/nurse 0 Yes Crutches/cane/walker 15 Yes Furniture 0 No Intravenous therapy Access/Saline/Heparin Lock 0 No Gait/Transferring Normal/ bed rest/ wheelchair 0 Yes Weak (short steps with or without shuffle, stooped but able to lift head while walking, may 10 Yes seek support from furniture) Impaired (short steps with shuffle, may have difficulty arising from chair, head down, impaired 0 No balance) Mental Status Oriented to own ability 0 Yes Electronic Signature(s) Signed: 07/20/2020 4:40:53 PM By: Georges Mouse, Minus Breeding RN Signed: 07/21/2020 8:44:23 AM By: [name] Entered By: [name] on 07/20/2020 13:27:24 [name] ([id number]) -------------------------------------------------------------------------------- Foot Assessment Details Patient Name: [name] Date of Service: 07/20/2020 1:00 PM Medical Record Number: [id number] Patient Account Number: [id number] Date of Birth/Sex: 05-28-1950 (69 y.o. F) Treating RN: [name] Primary Care Cheyna Retana: [name] Other Clinician: [name] Referring Taha Dimond:  [name] Treating Ahnya Akre/Extender: [name] in Treatment: 0 Foot Assessment Items Site Locations + = Sensation present, - = Sensation absent, C = Callus, U = Ulcer R = Redness, W = Warmth, M = Maceration, PU = Pre-ulcerative lesion F = Fissure, S = Swelling, D = Dryness Assessment Right: Left: Other Deformity: No No Prior Foot Ulcer: No No Prior Amputation: No No Charcot Joint: No No Ambulatory Status: Gait: Electronic Signature(s) Signed: 07/20/2020 4:40:53 PM By: Georges Mouse, Minus Breeding RN Signed: 07/21/2020 8:44:23 AM By: [name] Entered By: [name] on 07/20/2020 13:33:03 [name] ([id number]) -------------------------------------------------------------------------------- Nutrition Risk Screening Details Patient Name: [name] Date of Service: 07/20/2020 1:00 PM Medical Record Number: [id number] Patient Account Number: [id number] Date of Birth/Sex: [birth year] (69 y.o. F) Treating RN: [name] Primary Care Aadyn Buchheit: [name] Other Clinician: [name] Referring Skilynn Durney: [name] Treating Patric Buckhalter/Extender: [name] Weeks in Treatment: 0 Height (in): 64 Weight (lbs): 265 Body Mass Index (BMI): 45.5 Nutrition Risk Screening Items Score Screening NUTRITION RISK SCREEN: I have an illness or condition that made me change the kind and/or amount of food I eat 0 No I eat fewer than two meals per day 0 No I eat few fruits and vegetables, or milk products 0 No I have three or more drinks of beer, liquor or wine almost every day 0 No I have tooth or mouth problems that make it hard for me to eat 0 No I don't always have enough money to buy the food I need 0 No I eat alone most of the time 0 No I take three or more different prescribed or over-the-counter drugs a day 1 Yes Without wanting to, I have lost or gained 10 pounds in the last six months 0 No I am not always physically able to shop, cook  and/or feed myself 0 No Nutrition Protocols Good Risk Protocol 0 No interventions needed Moderate Risk Protocol High Risk Proctocol Risk Level: Good Risk Score: 1 Electronic Signature(s) Signed: 07/20/2020 4:40:53 PM By: Georges Mouse, Minus Breeding RN Signed: 07/21/2020 8:44:23 AM By: [name] Entered By: [name] on 07/20/2020 13:28:15

## 2020-07-24 NOTE — Progress Notes (Signed)
[name] ([id number]) Visit Report for 07/20/2020 Allergy List Details Patient Name: [name] Date of Service: 07/20/2020 1:00 PM Medical Record Number: [id number] Patient Account Number: [id number] Date of Birth/Sex: 1951/04/10 (69 y.o. F) Treating RN: [name] Primary Care [name]: [name] Other Clinician: [name] Referring Sonakshi Rolland: [name] Treating [name]/Extender: [name] Weeks in Treatment: 0 Allergies Active Allergies No Known Drug Allergies Allergy Notes Electronic Signature(s) Signed: 07/21/2020 8:44:23 AM By: [name] Entered By: [name] on 07/20/2020 13:18:58 [name] ([id number]) -------------------------------------------------------------------------------- Arrival Information Details Patient Name: [name] Date of Service: 07/20/2020 1:00 PM Medical Record Number: [id number] Patient Account Number: [id number] Date of Birth/Sex: 1951/03/21 (69 y.o. F) Treating RN: [name] Primary Care [name]: [name] Other Clinician: [name] Referring [name]: [name] Treating [name]/Extender: [name] in Treatment: 0 Visit Information Patient Arrived: [name] Arrival Time: 13:13 Accompanied By: husband Transfer Assistance: None Patient Identification Verified: Yes Secondary Verification Process Completed: Yes History Since Last Visit Had a fall or experienced change in activities of daily living that may affect risk of falls: No Pain Present Now: Yes Electronic Signature(s) Signed: 07/21/2020 8:44:23 AM By: [name] Entered By: [name] on 07/20/2020 13:13:47 [name] ([id number]) -------------------------------------------------------------------------------- Clinic Level of Care Assessment Details Patient Name: [name] Date of Service: 07/20/2020 1:00 PM Medical Record Number: [id number] Patient Account Number:  [id number] Date of Birth/Sex: [birth year] (69 y.o. F) Treating RN: [name] Primary Care [name]: [name] Other Clinician: [name] Referring Jyquan Kenley: [name] Treating Hali Balgobin/Extender: [name] in Treatment: 0 Clinic Level of Care Assessment Items TOOL 3 Quantity Score X - Use when EandM and Procedure is performed on FOLLOW-UP visit 1 0 ASSESSMENTS - Nursing Assessment / Reassessment X - Reassessment of Co-morbidities (includes updates in patient status) 1 10 X- 1 5 Reassessment of Adherence to Treatment Plan ASSESSMENTS - Wound and Skin Assessment / Reassessment []  - Points for Wound Assessment can only be taken for a new wound of unknown or different etiology and a 0 procedure is NOT performed to that wound X- 1 5 Simple Wound Assessment / Reassessment - one wound []  - 0 Complex Wound Assessment / Reassessment - multiple wounds []  - 0 Dermatologic / Skin Assessment (not related to wound area) ASSESSMENTS - Focused Assessment []  - Circumferential Edema Measurements - multi extremities 0 []  - 0 Nutritional Assessment / Counseling / Intervention []  - 0 Lower Extremity Assessment (monofilament, tuning fork, pulses) []  - 0 Peripheral Arterial Disease Assessment (using hand held doppler) ASSESSMENTS - Ostomy and/or Continence Assessment and Care []  - Incontinence Assessment and Management 0 []  - 0 Ostomy Care Assessment and Management (repouching, etc.) PROCESS - Coordination of Care []  - Points for Discharge Coordination can only be taken for a new wound of unknown or different etiology and a 0 procedure is NOT performed to that wound X- 1 15 Simple Patient / Family Education for ongoing care []  - 0 Complex (extensive) Patient / Family Education for ongoing care []  - 0 Staff obtains Programmer, systems, Records, Test Results / Process Orders []  - 0 Staff telephones HHA, Nursing Homes / Clarify orders / etc []  - 0 Routine Transfer to  another Facility (non-emergent condition) []  - 0 Routine Hospital Admission (non-emergent condition) []  - 0 New Admissions / Biomedical engineer / Ordering NPWT, Apligraf, etc. []  - 0 Emergency Hospital Admission (emergent condition) X- 1 10 Simple Discharge Coordination []  - 0 Complex (extensive) Discharge Coordination PROCESS -  Special Needs []  - Pediatric / Minor Patient Management 0 []  - 0 Isolation Patient Management []  - 0 Hearing / Language / Visual special needs []  - 0 Assessment of Community assistance (transportation, D/C planning, etc.) [name], [name] ([id number]) []  - 0 Additional assistance / Altered mentation []  - 0 Support Surface(s) Assessment (bed, cushion, seat, etc.) INTERVENTIONS - Wound Cleansing / Measurement []  - Points for Wound Cleaning / Measurement, Wound Dressing, Specimen Collection and Specimen taken to lab 0 can only be taken for a new wound of unknown or different etiology and a procedure is NOT performed to that wound X- 1 5 Simple Wound Cleansing - one wound []  - 0 Complex Wound Cleansing - multiple wounds X- 1 5 Wound Imaging (photographs - any number of wounds) []  - 0 Wound Tracing (instead of photographs) X- 1 5 Simple Wound Measurement - one wound []  - 0 Complex Wound Measurement - multiple wounds INTERVENTIONS - Wound Dressings []  - Small Wound Dressing one or multiple wounds 0 X- 1 15 Medium Wound Dressing one or multiple wounds []  - 0 Large Wound Dressing one or multiple wounds INTERVENTIONS - Miscellaneous []  - External ear exam 0 []  - 0 Specimen Collection (cultures, biopsies, blood, body fluids, etc.) []  - 0 Specimen(s) / Culture(s) sent or taken to Lab for analysis []  - 0 Patient Transfer (multiple staff / Civil Service fast streamer / Similar devices) []  - 0 Simple Staple / Suture removal (25 or less) []  - 0 Complex Staple / Suture removal (26 or more) []  - 0 Hypo / Hyperglycemic Management (close monitor of Blood  Glucose) X- 1 15 Ankle / Brachial Index (ABI) - do not check if billed separately X- 1 5 Vital Signs Has the patient been seen at the hospital within the last three years: Yes Total Score: 95 Level Of Care: New/Established - Level 3 Electronic Signature(s) Signed: 07/20/2020 4:40:53 PM By: Georges Mouse, Minus Breeding RN Entered By: Georges Mouse, Kenia on 07/20/2020 14:20:10 [name] ([id number]) -------------------------------------------------------------------------------- Compression Therapy Details Patient Name: [name] Date of Service: 07/20/2020 1:00 PM Medical Record Number: [id number] Patient Account Number: [id number] Date of Birth/Sex: Dec 17, 1950 (69 y.o. F) Treating RN: [name] Primary Care Niambi Smoak: [name] Other Clinician: [name] Referring Almee Pelphrey: [name] Treating Dimitrious Micciche/Extender: [name] Weeks in Treatment: 0 Compression Therapy Performed for Wound Assessment: Wound #12 Left Calcaneus Performed By: Clinician [name], RN Compression Type: Three Layer Pre Treatment ABI: 1.1 Post Procedure Diagnosis Same as Pre-procedure Electronic Signature(s) Signed: 07/20/2020 4:40:53 PM By: Georges Mouse, Minus Breeding RN Entered By: Georges Mouse, Kenia on 07/20/2020 14:18:59 [name] ([id number]) -------------------------------------------------------------------------------- Encounter Discharge Information Details Patient Name: [name] Date of Service: 07/20/2020 1:00 PM Medical Record Number: [id number] Patient Account Number: [id number] Date of Birth/Sex: 1951-01-21 (69 y.o. F) Treating RN: Carlene Coria Primary Care Annia Gomm: [name] Other Clinician: [name] Referring Arlin Sass: [name] Treating Mahoganie Basher/Extender: [name] in Treatment: 0 Encounter Discharge Information Items Post Procedure Vitals Discharge Condition: Stable Temperature (F): 98.5 Ambulatory  Status: Walker Pulse (bpm): 90 Discharge Destination: Home Respiratory Rate (breaths/min): 20 Transportation: Private Auto Blood Pressure (mmHg): 108/56 Accompanied By: self Schedule Follow-up Appointment: Yes Clinical Summary of Care: Patient Declined Electronic Signature(s) Signed: 07/24/2020 5:08:01 PM By: Carlene Coria RN Entered By: Carlene Coria on 07/20/2020 14:38:07 Brauer, Jaynie [name] ([id number]) -------------------------------------------------------------------------------- Lower Extremity Assessment Details Patient Name: [name] Date of Service: 07/20/2020 1:00 PM Medical Record Number: [id number] Patient Account Number: [id number] Date of  Birth/Sex: 1951-02-26 (70 y.o. F) Treating RN: [name] Primary Care Nhu Glasby: [name] Other Clinician: [name] Referring Carmell Elgin: [name] Treating Nainoa Woldt/Extender: [name] Weeks in Treatment: 0 Edema Assessment Assessed: [Left: No] [Right: No] Edema: [Left: Yes] [Right: Yes] Calf Left: Right: Point of Measurement: 36 cm From Medial Instep 41.6 cm 39.5 cm Ankle Left: Right: Point of Measurement: 10 cm From Medial Instep 20.7 cm 21 cm Vascular Assessment Pulses: Dorsalis Pedis Palpable: [Left:No] [Right:Yes] Doppler Audible: [Left:Yes] [Right:Yes] Blood Pressure: Brachial: [Left:110] Dorsalis Pedis: 120 Ankle: Posterior Tibial: 90 Ankle Brachial Index: [Left:1.09] Electronic Signature(s) Signed: 07/20/2020 4:40:53 PM By: Georges Mouse, Minus Breeding RN Signed: 07/21/2020 8:44:23 AM By: [name] Entered By: [name] on 07/20/2020 13:53:56 Kuehl, Jaynie [name] ([id number]) -------------------------------------------------------------------------------- Multi Wound Chart Details Patient Name: [name] Date of Service: 07/20/2020 1:00 PM Medical Record Number: [id number] Patient Account Number: [id number] Date of Birth/Sex: 12-02-1950 (69 y.o. F) Treating RN: [name] Primary Care Caryl Fate: [name] Other Clinician: [name] Referring Aubriana Ravelo: [name] Treating Kahron Kauth/Extender: [name] in Treatment: 0 Vital Signs Height(in): 64 Pulse(bpm): 90 Weight(lbs): 265 Blood Pressure(mmHg): 108/56 Body Mass Index(BMI): 45 Temperature(F): 98.5 Respiratory Rate(breaths/min): 20 Photos: [N/A:N/A] Wound Location: Left Calcaneus N/A N/A Wounding Event: Pressure Injury N/A N/A Primary Etiology: Pressure Ulcer N/A N/A Comorbid History: Cataracts, Chronic Obstructive N/A N/A Pulmonary Disease (COPD), Sleep Apnea, Hypertension, Osteoarthritis, Neuropathy Date Acquired: 06/27/2020 N/A N/A Weeks of Treatment: 0 N/A N/A Wound Status: Open N/A N/A Measurements L x W x D (cm) 2.5x2.6x0.2 N/A N/A Area (cm) : 5.105 N/A N/A Volume (cm) : 1.021 N/A N/A % Reduction in Area: 0.00% N/A N/A % Reduction in Volume: 0.00% N/A N/A Classification: Category/Stage III N/A N/A Exudate Amount: Medium N/A N/A Exudate Type: Serosanguineous N/A N/A Exudate Color: red, brown N/A N/A Granulation Amount: None Present (0%) N/A N/A Necrotic Amount: Large (67-100%) N/A N/A Exposed Structures: Fascia: No N/A N/A Fat Layer (Subcutaneous Tissue): No Tendon: No Muscle: No Joint: No Bone: No Epithelialization: None N/A N/A Treatment Notes Electronic Signature(s) Signed: 07/20/2020 4:40:53 PM By: Georges Mouse, Minus Breeding RN Entered By: Georges Mouse, Minus Breeding on 07/20/2020 14:09:54 [name] ([id number]) -------------------------------------------------------------------------------- Multi-Disciplinary Care Plan Details Patient Name: [name] Date of Service: 07/20/2020 1:00 PM Medical Record Number: [id number] Patient Account Number: [id number] Date of Birth/Sex: [birth year] (69 y.o. F) Treating RN: [name] Primary Care Kassey Laforest: [name] Other Clinician: [name] Referring Gabriellah Rabel: [name] Treating Dianca Owensby/Extender: [name] in Treatment: 0 Active Inactive Orientation to the Wound Care Program Nursing Diagnoses: Knowledge deficit related to the wound healing center program Goals: Patient/caregiver will verbalize understanding of the [place] [facility] Program Date Initiated: 07/20/2020 Target Resolution Date: 07/20/2020 Goal Status: Active Interventions: Provide education on orientation to the wound center Notes: Pressure Nursing Diagnoses: Knowledge deficit related to causes and risk factors for pressure ulcer development Knowledge deficit related to management of pressures ulcers Goals: Patient will remain free from development of additional pressure ulcers Date Initiated: 07/20/2020 Target Resolution Date: 08/20/2020 Goal Status: Active Patient will remain free of pressure ulcers Date Initiated: 07/20/2020 Target Resolution Date: 08/20/2020 Goal Status: Active Patient/caregiver will verbalize risk factors for pressure ulcer development Date Initiated: 07/20/2020 Target Resolution Date: 07/20/2020 Goal Status: Active Patient/caregiver will verbalize understanding of pressure ulcer management Date Initiated: 07/20/2020 Target Resolution Date: 07/20/2020 Goal Status: Active Interventions: Assess: immobility, friction, shearing, incontinence upon admission and as needed Assess offloading mechanisms upon admission and as needed Assess potential  for pressure ulcer upon admission and as needed Notes: Wound/Skin Impairment Nursing Diagnoses: Impaired tissue integrity Goals: Patient/caregiver will verbalize understanding of skin care regimen Date Initiated: 07/20/2020 Target Resolution Date: 07/20/2020 Goal Status: Active Ulcer/skin breakdown will have a volume reduction of 30% by week 4 Date Initiated: 07/20/2020 Target Resolution Date: 08/20/2020 Goal Status: Active ALAYLA, DETHLEFS ([id number]) Ulcer/skin breakdown will have a volume reduction of  50% by week 8 Date Initiated: 07/20/2020 Target Resolution Date: 09/19/2020 Goal Status: Active Ulcer/skin breakdown will have a volume reduction of 80% by week 12 Date Initiated: 07/20/2020 Target Resolution Date: 10/20/2020 Goal Status: Active Ulcer/skin breakdown will heal within 14 weeks Date Initiated: 07/20/2020 Target Resolution Date: 11/19/2020 Goal Status: Active Interventions: Assess patient/caregiver ability to obtain necessary supplies Assess patient/caregiver ability to perform ulcer/skin care regimen upon admission and as needed Provide education on ulcer and skin care Treatment Activities: Referred to DME Leo Weyandt for dressing supplies : 07/20/2020 Skin care regimen initiated : 07/20/2020 Notes: Electronic Signature(s) Signed: 07/20/2020 4:40:53 PM By: Georges Mouse, Minus Breeding RN Entered By: Georges Mouse, Minus Breeding on 07/20/2020 14:09:29 [name] ([id number]) -------------------------------------------------------------------------------- Pain Assessment Details Patient Name: [name] Date of Service: 07/20/2020 1:00 PM Medical Record Number: [id number] Patient Account Number: [id number] Date of Birth/Sex: 07/24/1950 (69 y.o. F) Treating RN: [name] Primary Care Tekoa Hamor: [name] Other Clinician: [name] Referring Glema Takaki: [name] Treating Mcgregor Tinnon/Extender: [name] in Treatment: 0 Active Problems Location of Pain Severity and Description of Pain Patient Has Paino Yes Site Locations Rate the pain. Current Pain Level: 7 Pain Management and Medication Current Pain Management: Electronic Signature(s) Signed: 07/20/2020 4:40:53 PM By: Georges Mouse, Minus Breeding RN Signed: 07/21/2020 8:44:23 AM By: [name] Entered By: [name] on 07/20/2020 13:18:01 [name] ([id number]) -------------------------------------------------------------------------------- Patient/Caregiver Education Details Patient  Name: [name] Date of Service: 07/20/2020 1:00 PM Medical Record Number: [id number] Patient Account Number: [id number] Date of Birth/Gender: 05-Nov-1950 (69 y.o. F) Treating RN: [name] Primary Care Physician: [name] Other Clinician: [name] Referring Physician: [name] Treating Physician/Extender: [name] in Treatment: 0 Education Assessment Education Provided To: Patient Education Topics Provided Notes keep pressure off heel, debridement education Electronic Signature(s) Signed: 07/20/2020 4:40:53 PM By: Georges Mouse, Minus Breeding RN Entered By: Georges Mouse, Minus Breeding on 07/20/2020 14:20:51 [name] ([id number]) -------------------------------------------------------------------------------- Wound Assessment Details Patient Name: [name] Date of Service: 07/20/2020 1:00 PM Medical Record Number: [id number] Patient Account Number: [id number] Date of Birth/Sex: Dec 24, 1950 (69 y.o. F) Treating RN: [name] Primary Care Flara Storti: [name] Other Clinician: [name] Referring Jazmynn Pho: [name] Treating Elissa Grieshop/Extender: [name] Weeks in Treatment: 0 Wound Status Wound Number: 12 Primary Pressure Ulcer Etiology: Wound Location: Left Calcaneus Wound Open Wounding Event: Pressure Injury Status: Date Acquired: 06/27/2020 Comorbid Cataracts, Chronic Obstructive Pulmonary Disease Weeks Of Treatment: 0 History: (COPD), Sleep Apnea, Hypertension, Osteoarthritis, Clustered Wound: No Neuropathy Photos Wound Measurements Length: (cm) 2.5 Width: (cm) 2.6 Depth: (cm) 0.2 Area: (cm) 5.105 Volume: (cm) 1.021 % Reduction in Area: 0% % Reduction in Volume: 0% Epithelialization: None Tunneling: No Undermining: No Wound Description Classification: Category/Stage III Exudate Amount: Medium Exudate Type: Serosanguineous Exudate Color: red, brown Foul Odor After Cleansing:  No Slough/Fibrino Yes Wound Bed Granulation Amount: None Present (0%) Exposed Structure Necrotic Amount: Large (67-100%) Fascia Exposed: No Necrotic Quality: Adherent Slough Fat Layer (Subcutaneous Tissue) Exposed: No Tendon Exposed: No Muscle Exposed: No Joint Exposed: No Bone Exposed: No Treatment Notes Wound #12 (Calcaneus) Wound  Laterality: Left Cleanser Normal Saline Discharge Instruction: Wash your hands with soap and water. Remove old dressing, discard into plastic bag and place into trash. Cleanse the wound with Normal Saline prior to applying a clean dressing using gauze sponges, not tissues or cotton balls. Do not scrub or use excessive force. Pat dry using gauze sponges, not tissue or cotton balls. ELSPETH, BLUCHER ([id number]) Peri-Wound Care Topical Primary Dressing IODOFLEX 0.9% Cadexomer Iodine Pad Discharge Instruction: Apply Iodoflex to wound bed only, extra sent with patient Secondary Dressing ABD Pad 5x9 (in/in) Discharge Instruction: Cover with ABD pad Secured With Compression Wrap Profore Lite LF 3 Multilayer Compression Bandaging System Discharge Instruction: Apply 3 multi-layer wrap as prescribed. Compression Stockings Add-Ons Electronic Signature(s) Signed: 07/20/2020 4:40:53 PM By: Georges Mouse, Minus Breeding RN Signed: 07/24/2020 5:08:01 PM By: Carlene Coria RN Entered By: Carlene Coria on 07/20/2020 13:51:05 [name] ([id number]) -------------------------------------------------------------------------------- Vitals Details Patient Name: [name] Date of Service: 07/20/2020 1:00 PM Medical Record Number: [id number] Patient Account Number: [id number] Date of Birth/Sex: February 23, 1951 (69 y.o. F) Treating RN: [name] Primary Care Etai Copado: [name] Other Clinician: [name] Referring Ashlinn Hemrick: [name] Treating Jamieson Lisa/Extender: [name] in Treatment: 0 Vital Signs Time Taken: 13:15 Temperature (F):  98.5 Height (in): 64 Pulse (bpm): 90 Source: Stated Respiratory Rate (breaths/min): 20 Weight (lbs): 265 Blood Pressure (mmHg): 108/56 Source: Stated Reference Range: 80 - 120 mg / dl Body Mass Index (BMI): 45.5 Electronic Signature(s) Signed: 07/21/2020 8:44:23 AM By: [name] Entered By: [name] on 07/20/2020 13:18:43

## 2020-07-27 ENCOUNTER — Ambulatory Visit: Payer: Medicare Other | Admitting: Physician Assistant

## 2020-08-04 ENCOUNTER — Other Ambulatory Visit: Payer: Self-pay

## 2020-08-04 ENCOUNTER — Encounter: Payer: Medicare Other | Attending: Physician Assistant | Admitting: Physician Assistant

## 2020-08-04 DIAGNOSIS — L89623 Pressure ulcer of left heel, stage 3: Secondary | ICD-10-CM | POA: Insufficient documentation

## 2020-08-04 DIAGNOSIS — I129 Hypertensive chronic kidney disease with stage 1 through stage 4 chronic kidney disease, or unspecified chronic kidney disease: Secondary | ICD-10-CM | POA: Insufficient documentation

## 2020-08-04 DIAGNOSIS — G35 Multiple sclerosis: Secondary | ICD-10-CM | POA: Insufficient documentation

## 2020-08-04 DIAGNOSIS — N183 Chronic kidney disease, stage 3 unspecified: Secondary | ICD-10-CM | POA: Insufficient documentation

## 2020-08-04 DIAGNOSIS — G629 Polyneuropathy, unspecified: Secondary | ICD-10-CM | POA: Diagnosis not present

## 2020-08-04 NOTE — Progress Notes (Addendum)
[name], [name] ([id number]) Visit Report for 08/04/2020 Chief Complaint Document Details Patient Name: [name], [name] Date of Service: 08/04/2020 2:45 PM Medical Record Number: [id number] Patient Account Number: [id number] Date of Birth/Sex: January 06, 1951 (70 y.o. F) Treating RN: Carlene Coria Primary Care Provider: [name] Other Clinician: Referring Provider: [name] Treating Provider/Extender: [name] in Treatment: 2 Information Obtained from: Patient Chief Complaint Left Heel Pressure Ulcer Electronic Signature(s) Signed: 08/04/2020 3:09:19 PM By: Worthy Keeler PA-C Entered By: Worthy Keeler on 08/04/2020 15:09:19 [name] ([id number]) -------------------------------------------------------------------------------- Debridement Details Patient Name: [name] Date of Service: 08/04/2020 2:45 PM Medical Record Number: [id number] Patient Account Number: [id number] Date of Birth/Sex: 05-May-1950 (70 y.o. F) Treating RN: Carlene Coria Primary Care Provider: [name] Other Clinician: Referring Provider: [name] Treating Provider/Extender: [name] in Treatment: 2 Debridement Performed for Wound #12 Left Calcaneus Assessment: Performed By: Physician Tommie Sams., PA-C Debridement Type: Debridement Level of Consciousness (Pre- Awake and Alert procedure): Pre-procedure Verification/Time Out Yes - 16:00 Taken: Start Time: 16:00 Pain Control: Lidocaine 4% Topical Solution Total Area Debrided (L x W): 1.8 (cm) x 2.4 (cm) = 4.32 (cm) Tissue and other material Viable, Non-Viable, Slough, Subcutaneous, Skin: Dermis , Skin: Epidermis, Slough debrided: Level: Skin/Subcutaneous Tissue Debridement Description: Excisional Instrument: Curette Bleeding: Minimum Hemostasis Achieved: Pressure End Time: 16:04 Procedural Pain: 0 Post Procedural Pain: 0 Response to Treatment: Procedure was tolerated well Level of  Consciousness (Post- Awake and Alert procedure): Post Debridement Measurements of Total Wound Length: (cm) 1.8 Stage: Category/Stage III Width: (cm) 2.4 Depth: (cm) 0.4 Volume: (cm) 1.357 Character of Wound/Ulcer Post Debridement: Improved Post Procedure Diagnosis Same as Pre-procedure Electronic Signature(s) Signed: 08/04/2020 5:24:00 PM By: Worthy Keeler PA-C Signed: 08/07/2020 7:55:29 AM By: Carlene Coria RN Entered By: Carlene Coria on 08/04/2020 16:02:30 [name] ([id number]) -------------------------------------------------------------------------------- HPI Details Patient Name: [name] Date of Service: 08/04/2020 2:45 PM Medical Record Number: [id number] Patient Account Number: [id number] Date of Birth/Sex: Dec 14, 1950 (70 y.o. F) Treating RN: Carlene Coria Primary Care Provider: [name] Other Clinician: Referring Provider: [name] Treating Provider/Extender: [name] in Treatment: 2 History of Present Illness Location: left and right lower extremity and dorsum of the right foot Quality: Patient reports experiencing a sharp pain to affected area(s). Severity: Patient rates the pain to be a 8 out of 10 especially with palpation Duration: Patient has had the wound for > 6 months prior to seeking treatment at the wound center Timing: Pain in wound is Intermittent in severity but persistent Context: The wound appeared gradually over time Modifying Factors: Other treatment(s) tried include: symptomatic treatment as advised by her PCP Associated Signs and Symptoms: Patient reports having increase swelling iin her bilateral lower extremities HPI Description: 70 year old with a past medical history significant for MS, urinary incontinence, and obesity. She has been seen in the wound clinic before for lower extremity ulcerations treated with compression therapy. she is also known to have hypertension, peripheral vascular disease, COPD,  obstructive sleep apnea, lumbar radiculopathy, kyphoscoliosis, urinary issues and tobacco abuse. Smokes a packet of cigarettes a day was recently seen at the [facility] for swelling of her legs and feet with a ulceration on the dorsum of the right foot which has been there for about 6 months. she was recently in the ER about a month ago where she was seen for shortness of breath and swelling of the legs and a chest x-ray was within normal limits  had an increase in her BNP and was given Lasix and put on doxycycline for a mild cellulitis and possible UTI. Wounds aresmaller today 11/13/15. Debrided and will continue Santyl. 12/21/2015 -- she was admitted to the hospital overnight on 12/18/2015 and diagnosed with urinary retention and cellulitis of the left lower leg. is past to take clindamycin and use Santyl for her wounds. 01/15/2016 -- come back to see [place] for almost a month and continues to be noncompliant with her dressings 01/30/16 patient presents today for a follow-up visit concerning her ongoing bilateral lower extremity wounds. We have not seen her for the past 2 weeks although we are supposed to be seen her for weekly visits. She currently has a Foley catheter. She tells me that the wounds are intensely painful especially with pressure and palpation at this point in time. Fortunately she is having no interval signs or symptoms of systemic infection but unfortunately the wounds have not improved dramatically since we last saw her. She does have home health coming to take care of her as well. She is currently not in any compression wraps. 02/06/16 ON evaluation today patient continues to experience discomfort regarding her bilateral lower extremity ulcers. She has continued to tolerate the dressing changes at this point in time and continues to have a Foley catheter as well. Fortunately she is back this week in the past she has been somewhat noncompliant with follow-ups I'm glad to  see her today. She tells me that her pain level varies but can be as high as a 7 out of 10 with manipulation of the wound. She tells me that she used to be on oxycodone which was managed by the pain clinic although she is no longer on that as she tells me that she was actually smoking marijuana at the time and when this was found out they discontinued her pain medication. She no longer is taking anything pain medication wise and she also does not smoke cigarettes nor marijuana at this point. 02/12/16; this is a patient I don't believe I have previously seen. She has multiple sclerosis. She has 4 punched-out areas on the anterior lateral left leg 2 areas on the right these are all in the same condition. Reasonably small [dime size] wounds each was some depth. Surface of these wounds does not look particularly healthy as there is adherent slough. There is no evidence of surrounding infection or inflammation. ABIs in this clinic were 0.87 on the right and 0.81 on the left. She is listed as having PAD and is a smoker. Not a diabetic 02/20/16 patient I gave antibiotics to last week for erythema around both wound sites on the left lateral leg and right lateral leg. This appears to be a lot better. One of the areas on the left leg is healed however she still has 3 punched-out open areas on the left lateral calf and one on the right lateral calf and one on the dorsal foot. She is an ex-smoker quitting 1 month ago. ABIs in this clinic were 0.87 on the right 0.81 on the left 03/12/16; this is a patient I really don't have a good sense of. It would appear for the first 5 or 6 weeks of her stay in this clinic she was cared for by Dr. Con Memos. She appeared on my schedule in mid October and I saw her twice. She has not been here however in 3 weeks. She has advanced Wound Care at home where she lives with her husband. She  has multiple sclerosis. I saw her the first time she had 4 punched-out small wounds on the  anterior lateral left leg it appears that she is now down to 2. She also had wounds on the lateral and medial aspect of her right leg which were also small and punched out. The only one that remains is on the right lateral. Because of the nature of her wound I went ahead and ordered formal arterial studies this showed an ABI of 1 on the right and one on the left. TBI of 1.4 on the right and 0.79 on the left. She had normal waveforms. She was in the emergency room on 02/28/16; there is a noted edema of her left leg after a fall. She had a duplex ultrasound that showed no evidence for an acute DVT from the left groin to the popliteal fossa. The study was limited in the calf veins due to edema. Also noticeable that she had a left inguinal enlarged lymph node up to 6.2 cm. She also had a left knee x-ray that showed no acute findings and a right foot x-ray that showed soft tissue swelling but no radiographic evidence of osteomyelitis. The patient does stop smoking 03-19-16 Ms Sosnowski presents today for evaluation of her bilateral lower extremity ulcerations. She states that she has not smoked in several weeks. She denies any pain or discomfort to bilateral lower extremities, tolerating compression therapy as ordered. 03/26/16; I have not seen this patient in 2 weeks however she has done very well with improvements in the areas in question. After we obtain normal arterial studies, increasing the 4-layer compression really seems to look done a nice job here. She has one open area on the left lateral leg and one on the medial right leg. These have filled in nicely and are now superficial wounds that showed epithelialized. I note the left inguinal lymph node at 6.2 cm. I may need to refer this back to the patient's primary doctor. 04/03/16; patient has 2 remaining wounds 1 on the left lateral leg and one on the right medial leg. Both of filled in nicely since we are able to increase her from a 3 layer to a 4 layer  compression. I am also following up on the lymph node notable on her left leg DVT rule out in November 04/10/16; area on the right lateral leg is healed. The area on the left leg is still open but looks considerably better with healthy granulation and less wound area. I12/20/17; last week we healed the patient doubt with regards to the wound on the right leg to her on compression stocking 20-30 mmHg as it turns out I don't think she had a stocking, predictably therefore she has reopened. The left leg as well as the wound on the lateral aspect. Been generally small line 04/24/16; we healed out her right leg 2 weeks ago to her own 20-30 mm compression stockings although as it turns out she didn't have these Taffe, Jamiesha H. ([id number]) and did not purchase some apparently because of financial issues. The left leg wound is on the lateral aspect. Both of these wounds are just about healed. 05/01/16; her wounds on the left leg are healed out today. The area on the right leg is also healed. Vitamin diligent effort of our staff we have not been able to get any form of compression stocking to this patient. The orders for juxtalite's were given to home health this never materialized. We ordered compression stockings I don't think  she was able to afford these. We had a discussion today with both the patient and her husband without these, will accumulate edema and the wounds will simply reopen again. 05/14/16 READMISSION this is a patient we healed out 2 weeks ago. As bilateral lower extremity venous wounds probably some degree of lymphedema. At the beginning in November I did a duplex ultrasound of her left leg that was negative for DVT but did show a lymph node in the left inguinal area presumably reactive. We've been using compression to her lower extremities eventually healed all her wounds on her bilateral calves but we were not able to get any form of compression stockings for her in spite of intense  efforts of our staff. She returns today with reopening on the left leg did not the right she has several superficial areas anteriorly but an actual frank ulcer on the lateral left calf. This is about the size of a dime or smaller. She does not really complain of pain. The patient has a history of PAD however arterial studies we did in November were normal. Her ABIs and Doppler waveforms were both within the normal limits. The patient has a history of MS. In discussing things with her today it would appear that the opening was noted 2 weeks ago according to the patient. We gave her Tubigrip stockings when she left the clinic 05/21/16; she has not yet had the duplex ultrasound of her thigh, this is booked for Thursday. The wound on the left lateral lower leg is improved 05/28/16; her duplex ultrasound of the left leg specifically her left thigh did not show a DVT however did show hypoechogenic enlarged left inguinal lymph nodes up to 6.9 x 3.7 x 6.8. This also showed in the one in November however maximal diameter was 6.2, at that point I thought these were reactive secondary to cellulitis however there is obviously a differential here. Her wound on the left lower leg is closed. She has a superficial open area on the base of her left fifth metatarsal head on the plantar foot. It looks as though she has some wrap injuries on the anterior leg 06/05/16; since she was last year she had a fall on 1/31. She was seen in the ER but sent back home. I think she is also been seen by her primary physician who picked up on the swelling and the lymphadenopathy. She is ordered a CT scan of the abdomen and pelvis however this will be without contrast because of stage IIIB renal insufficiency. Nevertheless this should be a start the workup to make sure there is not is more systemic problem here. She will probably need consideration of a biopsy of the lymph node in her inguinal area, this would need general surgery. As far as  her wounds are concern today the area on the left plantar foot is healed. She has a small weeping area on the left lower leg. The wrap injury from last week has largely Pardoxically there is a lot less edema in the left thigh. 06/12/16; CT scan is on Friday. She has 2 small open areas one on the lateral left lower leg and one on the anterior lower leg on the left. Both of these looks healthy. I reduced her compression last week to Kerlix and Coban this seems to have not a reasonable job 07/02/16; the patient is now in the skilled facility. I think this was arranged out of the ER when she fell at home although I'm not exactly  sure. She is going for I think a laparotomy for the pelvic mass next week. All the wounds in the left leg have healed Readmission: 07/20/2020 upon evaluation today patient presents for reevaluation here in clinic unfortunately she is having issues with a significant stage III pressure ulcer to her left heel. This is something that occurred when she was in the hospital sick. And subsequently though she does not appear to have any new injury she is still trying to recover from the pressure injury which was quite severe during that time. I do not see any signs of infection right now which is great news. With that being said she has been require some debridement to try to clear this up as much as possible. We need to get the wound VAC cleared so that she can see overall healing and improvement that were looking for. The patient does have a history of hypertension, chronic kidney disease stage III, COPD, and multiple sclerosis. Currently she has been trying to do what she can to keep pressure off of her heel while in bed and in other locations as well. 08/04/20 upon evaluation today patient appears to be doing extremely well currently in regard to her wound on the heel. I do believe the Iodoflex has helped loosen things appear. Unfortunately she tells me that the home health nurse has not  been putting the Iodoflex on the wound. That is news to [place] and that they tell me they got supplies in the mail but they did not see the Iodoflex being used. Nonetheless I am wondering if this is just something that they missed or if it truly has not been. I did tell them to look when they get home in their box of supplies to see if they saw the dressing which showed and what it should look like. Electronic Signature(s) Signed: 08/04/2020 4:55:09 PM By: Worthy Keeler PA-C Previous Signature: 08/04/2020 4:54:58 PM Version By: Worthy Keeler PA-C Entered By: Worthy Keeler on 08/04/2020 16:55:08 [name] ([id number]) -------------------------------------------------------------------------------- Physical Exam Details Patient Name: [name] Date of Service: 08/04/2020 2:45 PM Medical Record Number: [id number] Patient Account Number: [id number] Date of Birth/Sex: 04/07/1951 (70 y.o. F) Treating RN: Carlene Coria Primary Care Provider: [name] Other Clinician: Referring Provider: [name] Treating Provider/Extender: [name] in Treatment: 2 Constitutional Well-nourished and well-hydrated in no acute distress. Respiratory normal breathing without difficulty. Psychiatric this patient is able to make decisions and demonstrates good insight into disease process. Alert and Oriented x 3. pleasant and cooperative. Notes Upon inspection patient's wound bed actually showed signs of good granulation epithelization at this point. There does not appear to be any signs of active infection which is great news and overall I am extremely pleased with where things stand currently. I do think that sharp debridement was necessary I perform that today to clear away some of the slough and necrotic debris from the surface of the wound she tolerated that without complication post debridement the wound bed appears to be doing much better. Electronic Signature(s) Signed:  08/04/2020 4:55:33 PM By: Worthy Keeler PA-C Entered By: Worthy Keeler on 08/04/2020 16:55:32 [name] ([id number]) -------------------------------------------------------------------------------- Physician Orders Details Patient Name: [name] Date of Service: 08/04/2020 2:45 PM Medical Record Number: [id number] Patient Account Number: [id number] Date of Birth/Sex: 05/27/1950 (70 y.o. F) Treating RN: Carlene Coria Primary Care Provider: [name] Other Clinician: Referring Provider: [name] Treating Provider/Extender: [name] in Treatment: 2  Verbal / Phone Orders: No Diagnosis Coding ICD-10 Coding Code Description [phone] Pressure ulcer of left heel, stage 3 I10 Essential (primary) hypertension N18.30 Chronic kidney disease, stage 3 unspecified J44.9 Chronic obstructive pulmonary disease, unspecified G35 Multiple sclerosis Follow-up Appointments o Return Appointment in 1 week. [place]: - [facility] [place] for wound care. May utilize formulary equivalent dressing for wound treatment orders unless otherwise specified. Home Health Nurse may visit PRN to address patientos wound care needs. o Scheduled days for dressing changes to be completed; exception, patient has scheduled wound care visit that day. - Mondays and Thursdays o **Please direct any NON-WOUND related issues/requests for orders to patient's Primary Care Physician. **If current dressing causes regression in wound condition, may D/C ordered dressing product/s and apply Normal Saline Moist Dressing daily until next [place] or Other MD appointment. **Notify Wound Healing Center of regression in wound condition at [phone]. Edema Control - Lymphedema / Segmental Compressive Device / Other o Optional: One layer of unna paste to top of compression wrap (to act as an anchor). Off-Loading o Open toe surgical shoe o  Other: - Keep pressure off left heel Wound Treatment Wound #12 - Calcaneus Wound Laterality: Left Cleanser: Normal Saline 2 x Per Week/30 Days Discharge Instructions: Wash your hands with soap and water. Remove old dressing, discard into plastic bag and place into trash. Cleanse the wound with Normal Saline prior to applying a clean dressing using gauze sponges, not tissues or cotton balls. Do not scrub or use excessive force. Pat dry using gauze sponges, not tissue or cotton balls. Primary Dressing: IODOFLEX 0.9% Cadexomer Iodine Pad 2 x Per Week/30 Days Discharge Instructions: Apply Iodoflex to wound bed only, extra sent with patient Secondary Dressing: ABD Pad 5x9 (in/in) 2 x Per Week/30 Days Discharge Instructions: Cover with ABD pad Compression Wrap: Profore Lite LF 3 Multilayer Compression Bandaging System 2 x Per Week/30 Days Discharge Instructions: Apply 3 multi-layer wrap as prescribed. Electronic Signature(s) Signed: 08/04/2020 5:24:00 PM By: Worthy Keeler PA-C Signed: 08/07/2020 7:55:29 AM By: Carlene Coria RN Entered By: Carlene Coria on 08/04/2020 16:04:12 [name] ([id number]) -------------------------------------------------------------------------------- Problem List Details Patient Name: [name] Date of Service: 08/04/2020 2:45 PM Medical Record Number: [id number] Patient Account Number: [id number] Date of Birth/Sex: 07-Aug-1950 (70 y.o. F) Treating RN: Carlene Coria Primary Care Provider: [name] Other Clinician: Referring Provider: [name] Treating Provider/Extender: [name] in Treatment: 2 Active Problems ICD-10 Encounter Code Description Active Date MDM Diagnosis [phone] Pressure ulcer of left heel, stage 3 07/20/2020 No Yes I10 Essential (primary) hypertension 07/20/2020 No Yes N18.30 Chronic kidney disease, stage 3 unspecified 07/20/2020 No Yes J44.9 Chronic obstructive pulmonary disease, unspecified 07/20/2020 No Yes G35  Multiple sclerosis 07/20/2020 No Yes Inactive Problems Resolved Problems Electronic Signature(s) Signed: 08/04/2020 3:09:13 PM By: Worthy Keeler PA-C Entered By: Worthy Keeler on 08/04/2020 15:09:13 [name] ([id number]) -------------------------------------------------------------------------------- Progress Note Details Patient Name: [name] Date of Service: 08/04/2020 2:45 PM Medical Record Number: [id number] Patient Account Number: [id number] Date of Birth/Sex: 06-14-1950 (70 y.o. F) Treating RN: Carlene Coria Primary Care Provider: [name] Other Clinician: Referring Provider: [name] Treating Provider/Extender: [name] in Treatment: 2 Subjective Chief Complaint Information obtained from Patient Left Heel Pressure Ulcer History of Present Illness (HPI) The following HPI elements were documented for the patient's wound: Location: left and right lower extremity and dorsum of the right foot Quality: Patient  reports experiencing a sharp pain to affected area(s). Severity: Patient rates the pain to be a 8 out of 10 especially with palpation Duration: Patient has had the wound for > 6 months prior to seeking treatment at the wound center Timing: Pain in wound is Intermittent in severity but persistent Context: The wound appeared gradually over time Modifying Factors: Other treatment(s) tried include: symptomatic treatment as advised by her PCP Associated Signs and Symptoms: Patient reports having increase swelling iin her bilateral lower extremities 70 year old with a past medical history significant for MS, urinary incontinence, and obesity. She has been seen in the wound clinic before for lower extremity ulcerations treated with compression therapy. she is also known to have hypertension, peripheral vascular disease, COPD, obstructive sleep apnea, lumbar radiculopathy, kyphoscoliosis, urinary issues and tobacco abuse. Smokes a packet of  cigarettes a day was recently seen at the [facility] for swelling of her legs and feet with a ulceration on the dorsum of the right foot which has been there for about 6 months. she was recently in the ER about a month ago where she was seen for shortness of breath and swelling of the legs and a chest x-ray was within normal limits had an increase in her BNP and was given Lasix and put on doxycycline for a mild cellulitis and possible UTI. Wounds aresmaller today 11/13/15. Debrided and will continue Santyl. 12/21/2015 -- she was admitted to the hospital overnight on 12/18/2015 and diagnosed with urinary retention and cellulitis of the left lower leg. is past to take clindamycin and use Santyl for her wounds. 01/15/2016 -- come back to see [place] for almost a month and continues to be noncompliant with her dressings 01/30/16 patient presents today for a follow-up visit concerning her ongoing bilateral lower extremity wounds. We have not seen her for the past 2 weeks although we are supposed to be seen her for weekly visits. She currently has a Foley catheter. She tells me that the wounds are intensely painful especially with pressure and palpation at this point in time. Fortunately she is having no interval signs or symptoms of systemic infection but unfortunately the wounds have not improved dramatically since we last saw her. She does have home health coming to take care of her as well. She is currently not in any compression wraps. 02/06/16 ON evaluation today patient continues to experience discomfort regarding her bilateral lower extremity ulcers. She has continued to tolerate the dressing changes at this point in time and continues to have a Foley catheter as well. Fortunately she is back this week in the past she has been somewhat noncompliant with follow-ups I'm glad to see her today. She tells me that her pain level varies but can be as high as a 7 out of 10 with manipulation of the  wound. She tells me that she used to be on oxycodone which was managed by the pain clinic although she is no longer on that as she tells me that she was actually smoking marijuana at the time and when this was found out they discontinued her pain medication. She no longer is taking anything pain medication wise and she also does not smoke cigarettes nor marijuana at this point. 02/12/16; this is a patient I don't believe I have previously seen. She has multiple sclerosis. She has 4 punched-out areas on the anterior lateral left leg 2 areas on the right these are all in the same condition. Reasonably small [dime size] wounds each was some depth.  Surface of these wounds does not look particularly healthy as there is adherent slough. There is no evidence of surrounding infection or inflammation. ABIs in this clinic were 0.87 on the right and 0.81 on the left. She is listed as having PAD and is a smoker. Not a diabetic 02/20/16 patient I gave antibiotics to last week for erythema around both wound sites on the left lateral leg and right lateral leg. This appears to be a lot better. One of the areas on the left leg is healed however she still has 3 punched-out open areas on the left lateral calf and one on the right lateral calf and one on the dorsal foot. She is an ex-smoker quitting 1 month ago. ABIs in this clinic were 0.87 on the right 0.81 on the left 03/12/16; this is a patient I really don't have a good sense of. It would appear for the first 5 or 6 weeks of her stay in this clinic she was cared for by Dr. Con Memos. She appeared on my schedule in mid October and I saw her twice. She has not been here however in 3 weeks. She has advanced Wound Care at home where she lives with her husband. She has multiple sclerosis. I saw her the first time she had 4 punched-out small wounds on the anterior lateral left leg it appears that she is now down to 2. She also had wounds on the lateral and medial aspect of her  right leg which were also small and punched out. The only one that remains is on the right lateral. Because of the nature of her wound I went ahead and ordered formal arterial studies this showed an ABI of 1 on the right and one on the left. TBI of 1.4 on the right and 0.79 on the left. She had normal waveforms. She was in the emergency room on 02/28/16; there is a noted edema of her left leg after a fall. She had a duplex ultrasound that showed no evidence for an acute DVT from the left groin to the popliteal fossa. The study was limited in the calf veins due to edema. Also noticeable that she had a left inguinal enlarged lymph node up to 6.2 cm. She also had a left knee x-ray that showed no acute findings and a right foot x-ray that showed soft tissue swelling but no radiographic evidence of osteomyelitis. The patient does stop smoking 03-19-16 Ms Weng presents today for evaluation of her bilateral lower extremity ulcerations. She states that she has not smoked in several weeks. She denies any pain or discomfort to bilateral lower extremities, tolerating compression therapy as ordered. 03/26/16; I have not seen this patient in 2 weeks however she has done very well with improvements in the areas in question. After we obtain normal arterial studies, increasing the 4-layer compression really seems to look done a nice job here. She has one open area on the left lateral leg and one on the medial right leg. These have filled in nicely and are now superficial wounds that showed epithelialized. I note the left inguinal lymph node at 6.2 cm. I may need to refer this back to the patient's primary doctor. 04/03/16; patient has 2 remaining wounds 1 on the left lateral leg and one on the right medial leg. Both of filled in nicely since we are able to increase her from a 3 layer to a 4 layer compression. I am also following up on the lymph node notable on her left  leg DVT rule out in November ALEEA, HENDRY.  ([id number]) 04/10/16; area on the right lateral leg is healed. The area on the left leg is still open but looks considerably better with healthy granulation and less wound area. I12/20/17; last week we healed the patient doubt with regards to the wound on the right leg to her on compression stocking 20-30 mmHg as it turns out I don't think she had a stocking, predictably therefore she has reopened. The left leg as well as the wound on the lateral aspect. Been generally small line 04/24/16; we healed out her right leg 2 weeks ago to her own 20-30 mm compression stockings although as it turns out she didn't have these and did not purchase some apparently because of financial issues. The left leg wound is on the lateral aspect. Both of these wounds are just about healed. 05/01/16; her wounds on the left leg are healed out today. The area on the right leg is also healed. Vitamin diligent effort of our staff we have not been able to get any form of compression stocking to this patient. The orders for juxtalite's were given to home health this never materialized. We ordered compression stockings I don't think she was able to afford these. We had a discussion today with both the patient and her husband without these, will accumulate edema and the wounds will simply reopen again. 05/14/16 READMISSION this is a patient we healed out 2 weeks ago. As bilateral lower extremity venous wounds probably some degree of lymphedema. At the beginning in November I did a duplex ultrasound of her left leg that was negative for DVT but did show a lymph node in the left inguinal area presumably reactive. We've been using compression to her lower extremities eventually healed all her wounds on her bilateral calves but we were not able to get any form of compression stockings for her in spite of intense efforts of our staff. She returns today with reopening on the left leg did not the right she has several superficial areas  anteriorly but an actual frank ulcer on the lateral left calf. This is about the size of a dime or smaller. She does not really complain of pain. The patient has a history of PAD however arterial studies we did in November were normal. Her ABIs and Doppler waveforms were both within the normal limits. The patient has a history of MS. In discussing things with her today it would appear that the opening was noted 2 weeks ago according to the patient. We gave her Tubigrip stockings when she left the clinic 05/21/16; she has not yet had the duplex ultrasound of her thigh, this is booked for Thursday. The wound on the left lateral lower leg is improved 05/28/16; her duplex ultrasound of the left leg specifically her left thigh did not show a DVT however did show hypoechogenic enlarged left inguinal lymph nodes up to 6.9 x 3.7 x 6.8. This also showed in the one in November however maximal diameter was 6.2, at that point I thought these were reactive secondary to cellulitis however there is obviously a differential here. Her wound on the left lower leg is closed. She has a superficial open area on the base of her left fifth metatarsal head on the plantar foot. It looks as though she has some wrap injuries on the anterior leg 06/05/16; since she was last year she had a fall on 1/31. She was seen in the ER but sent back home. I  think she is also been seen by her primary physician who picked up on the swelling and the lymphadenopathy. She is ordered a CT scan of the abdomen and pelvis however this will be without contrast because of stage IIIB renal insufficiency. Nevertheless this should be a start the workup to make sure there is not is more systemic problem here. She will probably need consideration of a biopsy of the lymph node in her inguinal area, this would need general surgery. As far as her wounds are concern today the area on the left plantar foot is healed. She has a small weeping area on the left lower  leg. The wrap injury from last week has largely Pardoxically there is a lot less edema in the left thigh. 06/12/16; CT scan is on Friday. She has 2 small open areas one on the lateral left lower leg and one on the anterior lower leg on the left. Both of these looks healthy. I reduced her compression last week to Kerlix and Coban this seems to have not a reasonable job 07/02/16; the patient is now in the skilled facility. I think this was arranged out of the ER when she fell at home although I'm not exactly sure. She is going for I think a laparotomy for the pelvic mass next week. All the wounds in the left leg have healed Readmission: 07/20/2020 upon evaluation today patient presents for reevaluation here in clinic unfortunately she is having issues with a significant stage III pressure ulcer to her left heel. This is something that occurred when she was in the hospital sick. And subsequently though she does not appear to have any new injury she is still trying to recover from the pressure injury which was quite severe during that time. I do not see any signs of infection right now which is great news. With that being said she has been require some debridement to try to clear this up as much as possible. We need to get the wound VAC cleared so that she can see overall healing and improvement that were looking for. The patient does have a history of hypertension, chronic kidney disease stage III, COPD, and multiple sclerosis. Currently she has been trying to do what she can to keep pressure off of her heel while in bed and in other locations as well. 08/04/20 upon evaluation today patient appears to be doing extremely well currently in regard to her wound on the heel. I do believe the Iodoflex has helped loosen things appear. Unfortunately she tells me that the home health nurse has not been putting the Iodoflex on the wound. That is news to [place] and that they tell me they got supplies in the mail but they  did not see the Iodoflex being used. Nonetheless I am wondering if this is just something that they missed or if it truly has not been. I did tell them to look when they get home in their box of supplies to see if they saw the dressing which showed and what it should look like. Objective Constitutional Well-nourished and well-hydrated in no acute distress. Vitals Time Taken: 3:09 PM, Height: 64 in, Weight: 265 lbs, BMI: 45.5, Temperature: 98.2 F, Pulse: 80 bpm, Respiratory Rate: 18 breaths/min, Blood Pressure: 121/73 mmHg. Respiratory normal breathing without difficulty. Psychiatric this patient is able to make decisions and demonstrates good insight into disease process. Alert and Oriented x 3. pleasant and cooperative. RHYLEE, PUCILLO ([id number]) General Notes: Upon inspection patient's wound bed actually showed  signs of good granulation epithelization at this point. There does not appear to be any signs of active infection which is great news and overall I am extremely pleased with where things stand currently. I do think that sharp debridement was necessary I perform that today to clear away some of the slough and necrotic debris from the surface of the wound she tolerated that without complication post debridement the wound bed appears to be doing much better. Integumentary (Hair, Skin) Wound #12 status is Open. Original cause of wound was Pressure Injury. The date acquired was: 06/27/2020. The wound has been in treatment 2 weeks. The wound is located on the Left Calcaneus. The wound measures 1.8cm length x 2.4cm width x 0.2cm depth; 3.393cm^2 area and 0.679cm^3 volume. There is Fat Layer (Subcutaneous Tissue) exposed. There is no tunneling or undermining noted. There is a medium amount of purulent drainage noted. There is medium (34-66%) red, pink granulation within the wound bed. There is a medium (34-66%) amount of necrotic tissue within the wound bed including Adherent  Slough. Assessment Active Problems ICD-10 Pressure ulcer of left heel, stage 3 Essential (primary) hypertension Chronic kidney disease, stage 3 unspecified Chronic obstructive pulmonary disease, unspecified Multiple sclerosis Procedures Wound #12 Pre-procedure diagnosis of Wound #12 is a Pressure Ulcer located on the Left Calcaneus . There was a Excisional Skin/Subcutaneous Tissue Debridement with a total area of 4.32 sq cm performed by Tommie Sams., PA-C. With the following instrument(s): Curette to remove Viable and Non-Viable tissue/material. Material removed includes Subcutaneous Tissue, Slough, Skin: Dermis, and Skin: Epidermis after achieving pain control using Lidocaine 4% Topical Solution. No specimens were taken. A time out was conducted at 16:00, prior to the start of the procedure. A Minimum amount of bleeding was controlled with Pressure. The procedure was tolerated well with a pain level of 0 throughout and a pain level of 0 following the procedure. Post Debridement Measurements: 1.8cm length x 2.4cm width x 0.4cm depth; 1.357cm^3 volume. Post debridement Stage noted as Category/Stage III. Character of Wound/Ulcer Post Debridement is improved. Post procedure Diagnosis Wound #12: Same as Pre-Procedure Plan Follow-up Appointments: Return Appointment in 1 week. Home Health: [facility] for wound care. May utilize formulary equivalent dressing for wound treatment orders unless otherwise specified. Home Health Nurse may visit [facility] to address patient s wound care needs. Scheduled days for dressing changes to be completed; exception, patient has scheduled wound care visit that day. - Mondays and Thursdays **Please direct any NON-WOUND related issues/requests for orders to patient's Primary Care Physician. **If current dressing causes regression in wound condition, may D/C ordered dressing product/s and apply Normal Saline Moist Dressing  daily until next [facility] or Other MD appointment. **Notify [facility] of regression in wound condition at [phone]. Edema Control - Lymphedema / Segmental Compressive Device / Other: Optional: One layer of unna paste to top of compression wrap (to act as an anchor). Off-Loading: Open toe surgical shoe Other: - Keep pressure off left heel WOUND #12: - Calcaneus Wound Laterality: Left Cleanser: Normal Saline 2 x Per Week/30 Days Discharge Instructions: Wash your hands with soap and water. Remove old dressing, discard into plastic bag and place into trash. Cleanse the wound with Normal Saline prior to applying a clean dressing using gauze sponges, not tissues or cotton balls. Do not scrub or use excessive force. Pat dry using gauze sponges, not tissue or cotton balls. Primary Dressing: IODOFLEX 0.9% Cadexomer Iodine Pad  2 x Per Week/30 Days Discharge Instructions: Apply Iodoflex to wound bed only, extra sent with patient Secondary Dressing: ABD Pad 5x9 (in/in) 2 x Per Week/30 Days Discharge Instructions: Cover with ABD pad Hitz, Dorisann H. ([id number]) Compression Wrap: Profore Lite LF 3 Multilayer Compression Bandaging System 2 x Per Week/30 Days Discharge Instructions: Apply 3 multi-layer wrap as prescribed. 1. I would recommend him going continue with the wound care measures as before specifically with regard to the Iodoflex I think that still the best way to go. 2. I am also can recommend that we continue to monitor for any signs of worsening infection. Obviously right now I do not see any signs of infection. 3. I am also can recommend she continue to offload the heel and we will utilize a 3 layer compression wrap to help with edema control. We will see patient back for reevaluation in 1 week here in the clinic. If anything worsens or changes patient will contact our office for additional recommendations. Electronic Signature(s) Signed: 08/04/2020 4:56:05 PM By:  Worthy Keeler PA-C Entered By: Worthy Keeler on 08/04/2020 16:56:05 Farfan, [name] ([id number]) -------------------------------------------------------------------------------- SuperBill Details Patient Name: [name] Date of Service: 08/04/2020 Medical Record Number: [id number] Patient Account Number: [id number] Date of Birth/Sex: January 09, 1951 (70 y.o. F) Treating RN: Carlene Coria Primary Care Provider: [name] Other Clinician: Referring Provider: [name] Treating Provider/Extender: [name] in Treatment: 2 Diagnosis Coding ICD-10 Codes Code Description [phone] Pressure ulcer of left heel, stage 3 I10 Essential (primary) hypertension N18.30 Chronic kidney disease, stage 3 unspecified J44.9 Chronic obstructive pulmonary disease, unspecified G35 Multiple sclerosis Facility Procedures CPT4 Code: [id number] Description: 03474 - DEB SUBQ TISSUE 20 SQ CM/< Modifier: Quantity: 1 CPT4 Code: Description: ICD-10 Diagnosis Description L89.623 Pressure ulcer of left heel, stage 3 Modifier: Quantity: Physician Procedures CPT4 Code: 2595638 Description: 11042 - WC PHYS SUBQ TISS 20 SQ CM Modifier: Quantity: 1 CPT4 Code: Description: ICD-10 Diagnosis Description L89.623 Pressure ulcer of left heel, stage 3 Modifier: Quantity: Electronic Signature(s) Signed: 08/04/2020 4:56:14 PM By: Worthy Keeler PA-C Entered By: Worthy Keeler on 08/04/2020 16:56:13

## 2020-08-04 NOTE — Progress Notes (Addendum)
[name] ([id number]) Visit Report for 08/04/2020 Arrival Information Details Patient Name: [name], [name] Date of Service: 08/04/2020 2:45 PM Medical Record Number: [id number] Patient Account Number: [id number] Date of Birth/Sex: April 05, 1951 (69 y.o. F) Treating RN: Carlene Coria Primary Care Darika Ildefonso: [name] Other Clinician: Referring Kayleah Appleyard: [name] Treating Audryanna Zurita/Extender: [name] in Treatment: 2 Visit Information History Since Last Visit Added or deleted any medications: No Patient Arrived: Wheel Chair Had a fall or experienced change in No Arrival Time: 15:07 activities of daily living that may affect Accompanied By: husband risk of falls: Transfer Assistance: None Hospitalized since last visit: No Patient Identification Verified: Yes Pain Present Now: Yes Secondary Verification Process Completed: Yes Electronic Signature(s) Signed: 08/08/2020 8:32:14 AM By: [name] Entered By: [name] on 08/04/2020 15:09:29 [name] ([id number]) -------------------------------------------------------------------------------- Clinic Level of Care Assessment Details Patient Name: [name] Date of Service: 08/04/2020 2:45 PM Medical Record Number: [id number] Patient Account Number: [id number] Date of Birth/Sex: 1950-10-21 (69 y.o. F) Treating RN: Carlene Coria Primary Care Sahian Kerney: [name] Other Clinician: Referring Dartha Rozzell: [name] Treating Dauna Ziska/Extender: [name] in Treatment: 2 Clinic Level of Care Assessment Items TOOL 1 Quantity Score _0  - Use when EandM and Procedure is performed on INITIAL visit 0 ASSESSMENTS - Nursing Assessment / Reassessment _1  - General Physical Exam (combine w/ comprehensive assessment (listed just below) when performed on new 0 pt. evals) _2  - 0 Comprehensive Assessment (HX, ROS, Risk Assessments, Wounds Hx, etc.) ASSESSMENTS - Wound and Skin Assessment /  Reassessment _3  - Dermatologic / Skin Assessment (not related to wound area) 0 ASSESSMENTS - Ostomy and/or Continence Assessment and Care _4  - Incontinence Assessment and Management 0 _5  - 0 Ostomy Care Assessment and Management (repouching, etc.) PROCESS - Coordination of Care _6  - Simple Patient / Family Education for ongoing care 0 _7  - 0 Complex (extensive) Patient / Family Education for ongoing care _8  - 0 Staff obtains Programmer, systems, Records, Test Results / Process Orders _9  - 0 Staff telephones HHA, Nursing Homes / Clarify orders / etc _10  - 0 Routine Transfer to another Facility (non-emergent condition) _11  - 0 Routine Hospital Admission (non-emergent condition) _12  - 0 New Admissions / Biomedical engineer / Ordering NPWT, Apligraf, etc. _13  - 0 Emergency Hospital Admission (emergent condition) PROCESS - Special Needs _14  - Pediatric / Minor Patient Management 0 _15  - 0 Isolation Patient Management _16  - 0 Hearing / Language / Visual special needs _17  - 0 Assessment of Community assistance (transportation, D/C planning, etc.) _18  - 0 Additional assistance / Altered mentation _19  - 0 Support Surface(s) Assessment (bed, cushion, seat, etc.) INTERVENTIONS - Miscellaneous _20  - External ear exam 0 _21  - 0 Patient Transfer (multiple staff / Civil Service fast streamer / Similar devices) _22  - 0 Simple Staple / Suture removal (25 or less) _23  - 0 Complex Staple / Suture removal (26 or more) _24  - 0 Hypo/Hyperglycemic Management (do not check if billed separately) _25  - 0 Ankle / Brachial Index (ABI) - do not check if billed separately Has the patient been seen at the hospital within the last three years: Yes Total Score: 0 Level Of Care: ____ [name] ([id number]) Electronic Signature(s) Signed: 08/07/2020 7:55:29 AM By: Carlene Coria RN Entered By: Carlene Coria on 08/04/2020 16:04:49 [name]  ([id number]) -------------------------------------------------------------------------------- Lower Extremity Assessment Details Patient Name: [name] Date of Service: 08/04/2020 2:45 PM Medical Record Number: [id number] Patient Account Number: [id number] Date of Birth/Sex: 11-13-1950 (69 y.o. F)  Treating RN: Carlene Coria Primary Care Chioke Noxon: [name] Other Clinician: Referring Ambrie Carte: [name] Treating Ashtyn Freilich/Extender: [name] in Treatment: 2 Edema Assessment Assessed: [Left: Yes] [Right: No] Edema: [Left: Ye] [Right: s] Calf Left: Right: Point of Measurement: 36 cm From Medial Instep 37.6 cm Ankle Left: Right: Point of Measurement: 10 cm From Medial Instep 19.8 cm Vascular Assessment Pulses: Dorsalis Pedis Palpable: [Left:Yes] Electronic Signature(s) Signed: 08/07/2020 7:55:29 AM By: Carlene Coria RN Signed: 08/08/2020 8:32:14 AM By: [name] Entered By: [name] on 08/04/2020 15:24:26 [name] ([id number]) -------------------------------------------------------------------------------- Multi Wound Chart Details Patient Name: [name] Date of Service: 08/04/2020 2:45 PM Medical Record Number: [id number] Patient Account Number: [id number] Date of Birth/Sex: 05/27/1950 (69 y.o. F) Treating RN: Carlene Coria Primary Care Daphane Odekirk: [name] Other Clinician: Referring Luisenrique Conran: [name] Treating Gavinn Collard/Extender: [name] in Treatment: 2 Vital Signs Height(in): 20 Pulse(bpm): 25 Weight(lbs): 28 Blood Pressure(mmHg): 121/73 Body Mass Index(BMI): 45 Temperature(F): 98.2 Respiratory Rate(breaths/min): 18 Photos: [N/A:N/A] Wound Location: Left Calcaneus N/A N/A Wounding Event: Pressure Injury N/A N/A Primary Etiology: Pressure Ulcer N/A N/A Comorbid History: Cataracts, Chronic Obstructive N/A N/A Pulmonary Disease (COPD), Sleep Apnea, Hypertension, Osteoarthritis, Neuropathy Date  Acquired: 06/27/2020 N/A N/A Weeks of Treatment: 2 N/A N/A Wound Status: Open N/A N/A Measurements L x W x D (cm) 1.8x2.4x0.2 N/A N/A Area (cm) : 3.393 N/A N/A Volume (cm) : 0.679 N/A N/A % Reduction in Area: 33.50% N/A N/A % Reduction in Volume: 33.50% N/A N/A Classification: Category/Stage III N/A N/A Exudate Amount: Medium N/A N/A Exudate Type: Purulent N/A N/A Exudate Color: yellow, brown, green N/A N/A Granulation Amount: Medium (34-66%) N/A N/A Granulation Quality: Red, Pink N/A N/A Necrotic Amount: Medium (34-66%) N/A N/A Exposed Structures: Fat Layer (Subcutaneous Tissue): N/A N/A Yes Fascia: No Tendon: No Muscle: No Joint: No Bone: No Epithelialization: None N/A N/A Treatment Notes Electronic Signature(s) Signed: 08/07/2020 7:55:29 AM By: Carlene Coria RN Entered By: Carlene Coria on 08/04/2020 16:01:00 [name] ([id number]) -------------------------------------------------------------------------------- Multi-Disciplinary Care Plan Details Patient Name: [name] Date of Service: 08/04/2020 2:45 PM Medical Record Number: [id number] Patient Account Number: [id number] Date of Birth/Sex: 1950-12-21 (69 y.o. F) Treating RN: Carlene Coria Primary Care Hagop Mccollam: [name] Other Clinician: Referring Arbie Reisz: [name] Treating Tamira Ryland/Extender: [name] in Treatment: 2 Active Inactive Pressure Nursing Diagnoses: Knowledge deficit related to causes and risk factors for pressure ulcer development Knowledge deficit related to management of pressures ulcers Goals: Patient will remain free from development of additional pressure ulcers Date Initiated: 07/20/2020 Target Resolution Date: 08/20/2020 Goal Status: Active Patient will remain free of pressure ulcers Date Initiated: 07/20/2020 Target Resolution Date: 08/20/2020 Goal Status: Active Patient/caregiver will verbalize risk factors for pressure ulcer development Date Initiated:  07/20/2020 Date Inactivated: 08/04/2020 Target Resolution Date: 07/20/2020 Goal Status: Met Patient/caregiver will verbalize understanding of pressure ulcer management Date Initiated: 07/20/2020 Date Inactivated: 08/04/2020 Target Resolution Date: 07/20/2020 Goal Status: Met Interventions: Assess: immobility, friction, shearing, incontinence upon admission and as needed Assess offloading mechanisms upon admission and as needed Assess potential for pressure ulcer upon admission and as needed Notes: Wound/Skin Impairment Nursing Diagnoses: Impaired tissue integrity Goals: Patient/caregiver will verbalize understanding of skin care regimen Date Initiated: 07/20/2020 Target Resolution Date: 08/20/2020 Goal Status: Active Ulcer/skin breakdown will have a volume reduction of 30% by week 4 Date Initiated: 07/20/2020 Target Resolution Date: 08/20/2020 Goal Status: Active Ulcer/skin breakdown will have a volume reduction of 50% by week 8 Date Initiated: 07/20/2020 Target Resolution  Date: 09/19/2020 Goal Status: Active Ulcer/skin breakdown will have a volume reduction of 80% by week 12 Date Initiated: 07/20/2020 Target Resolution Date: 10/20/2020 Goal Status: Active Ulcer/skin breakdown will heal within 14 weeks Date Initiated: 07/20/2020 Target Resolution Date: 11/19/2020 Goal Status: Active Interventions: Assess patient/caregiver ability to obtain necessary supplies Assess patient/caregiver ability to perform ulcer/skin care regimen upon admission and as needed Provide education on ulcer and skin care [name], [name] ([id number]) Treatment Activities: Referred to DME Carmine Carrozza for dressing supplies : 07/20/2020 Skin care regimen initiated : 07/20/2020 Notes: Electronic Signature(s) Signed: 08/07/2020 7:55:29 AM By: Carlene Coria RN Entered By: Carlene Coria on 08/04/2020 16:00:53 [name] ([id number]) -------------------------------------------------------------------------------- Pain  Assessment Details Patient Name: [name] Date of Service: 08/04/2020 2:45 PM Medical Record Number: [id number] Patient Account Number: [id number] Date of Birth/Sex: [birth year] (69 y.o. F) Treating RN: Carlene Coria Primary Care Lawernce Earll: [name] Other Clinician: Referring Maysel Mccolm: [name] Treating Aviyana Sonntag/Extender: [name] in Treatment: 2 Active Problems Location of Pain Severity and Description of Pain Patient Has Paino Yes Site Locations Pain Location: Generalized Pain Rate the pain. Current Pain Level: 4 Pain Management and Medication Current Pain Management: Electronic Signature(s) Signed: 08/07/2020 7:55:29 AM By: Carlene Coria RN Signed: 08/08/2020 8:32:14 AM By: [name] Entered By: [name] on 08/04/2020 15:09:57 [name] ([id number]) -------------------------------------------------------------------------------- Patient/Caregiver Education Details Patient Name: [name] Date of Service: 08/04/2020 2:45 PM Medical Record Number: [id number] Patient Account Number: [id number] Date of Birth/Gender: 1950/10/13 (69 y.o. F) Treating RN: Carlene Coria Primary Care Physician: [name] Other Clinician: Referring Physician: [name] Treating Physician/Extender: [name] in Treatment: 2 Education Assessment Education Provided To: Patient Education Topics Provided Wound/Skin Impairment: Methods: Explain/Verbal Responses: State content correctly Electronic Signature(s) Signed: 08/07/2020 7:55:29 AM By: Carlene Coria RN Entered By: Carlene Coria on 08/04/2020 16:05:04 [name] ([id number]) -------------------------------------------------------------------------------- Wound Assessment Details Patient Name: [name] Date of Service: 08/04/2020 2:45 PM Medical Record Number: [id number] Patient Account Number: [id number] Date of Birth/Sex: [birth year] (69 y.o. F) Treating RN: Carlene Coria Primary Care Shundra Wirsing: [name] Other Clinician: Referring Cabella Kimm: [name] Treating Janin Kozlowski/Extender: [name] in Treatment: 2 Wound Status Wound Number: 12 Primary Pressure Ulcer Etiology: Wound Location: Left Calcaneus Wound Open Wounding Event: Pressure Injury Status: Date Acquired: 06/27/2020 Comorbid Cataracts, Chronic Obstructive Pulmonary Disease Weeks Of Treatment: 2 History: (COPD), Sleep Apnea, Hypertension, Osteoarthritis, Clustered Wound: No Neuropathy Photos Wound Measurements Length: (cm) 1.8 Width: (cm) 2.4 Depth: (cm) 0.2 Area: (cm) 3.393 Volume: (cm) 0.679 % Reduction in Area: 33.5% % Reduction in Volume: 33.5% Epithelialization: None Tunneling: No Undermining: No Wound Description Classification: Category/Stage III Exudate Amount: Medium Exudate Type: Purulent Exudate Color: yellow, brown, green Foul Odor After Cleansing: No Slough/Fibrino Yes Wound Bed Granulation Amount: Medium (34-66%) Exposed Structure Granulation Quality: Red, Pink Fascia Exposed: No Necrotic Amount: Medium (34-66%) Fat Layer (Subcutaneous Tissue) Exposed: Yes Necrotic Quality: Adherent Slough Tendon Exposed: No Muscle Exposed: No Joint Exposed: No Bone Exposed: No Electronic Signature(s) Signed: 08/07/2020 7:55:29 AM By: Carlene Coria RN Signed: 08/08/2020 8:32:14 AM By: [name] Entered By: [name] on 08/04/2020 15:21:03 [name] ([id number]) -------------------------------------------------------------------------------- Vitals Details Patient Name: [name] Date of Service: 08/04/2020 2:45 PM Medical Record Number: [id number] Patient Account Number: [id number] Date of Birth/Sex: April 23, 1951 (69 y.o. F) Treating RN: Carlene Coria Primary Care Ameilia Rattan: [name] Other Clinician: Referring Erric Machnik: [name] Treating Nickolis Diel/Extender: [name] in Treatment: 2 Vital Signs Time  Taken: 15:09 Temperature (F): 98.2 Height (in): 64 Pulse (bpm): 80 Weight (lbs): 265 Respiratory Rate (breaths/min): 18 Body Mass Index (BMI): 45.5 Blood Pressure (mmHg): 121/73 Reference Range: 80 - 120 mg / dl Electronic Signature(s) Signed: 08/08/2020 8:32:14 AM By: [name] Entered By: [name] on 08/04/2020 15:09:45

## 2020-08-10 ENCOUNTER — Other Ambulatory Visit: Payer: Self-pay | Admitting: Urology

## 2020-08-11 ENCOUNTER — Encounter: Payer: Medicare Other | Admitting: Physician Assistant

## 2020-08-11 ENCOUNTER — Other Ambulatory Visit
Admission: RE | Admit: 2020-08-11 | Discharge: 2020-08-11 | Disposition: A | Discharge status: Home / Self Care | Payer: Medicare Other | Source: Ambulatory Visit | Attending: Physician Assistant | Admitting: Physician Assistant

## 2020-08-11 ENCOUNTER — Other Ambulatory Visit: Payer: Self-pay

## 2020-08-11 DIAGNOSIS — L89623 Pressure ulcer of left heel, stage 3: Secondary | ICD-10-CM | POA: Diagnosis not present

## 2020-08-11 DIAGNOSIS — A499 Bacterial infection, unspecified: Secondary | ICD-10-CM | POA: Diagnosis present

## 2020-08-11 NOTE — Progress Notes (Signed)
[name], [name] ([id number]) Visit Report for 08/11/2020 Chief Complaint Document Details Patient Name: [name], [name] Date of Service: 08/11/2020 1:15 PM Medical Record Number: [id number] Patient Account Number: [id number] Date of Birth/Sex: 1950/07/13 (69 y.o. F) Treating RN: Carlene Coria Primary Care Provider: [name] Other Clinician: [name] Referring Provider: [name] Treating Provider/Extender: [name] in Treatment: 3 Information Obtained from: Patient Chief Complaint Left Heel Pressure Ulcer Electronic Signature(s) Signed: 08/11/2020 2:09:56 PM By: Worthy Keeler PA-C Entered By: Worthy Keeler on 08/11/2020 14:09:56 [name], [name] ([id number]) -------------------------------------------------------------------------------- HPI Details Patient Name: [name] Date of Service: 08/11/2020 1:15 PM Medical Record Number: [id number] Patient Account Number: [id number] Date of Birth/Sex: [birth year] (69 y.o. F) Treating RN: Carlene Coria Primary Care Provider: [name] Other Clinician: [name] Referring Provider: [name] Treating Provider/Extender: [name] in Treatment: 3 History of Present Illness Location: left and right lower extremity and dorsum of the right foot Quality: Patient reports experiencing a sharp pain to affected area(s). Severity: Patient rates the pain to be a 8 out of 10 especially with palpation Duration: Patient has had the wound for > 6 months prior to seeking treatment at the wound center Timing: Pain in wound is Intermittent in severity but persistent Context: The wound appeared gradually over time Modifying Factors: Other treatment(s) tried include: symptomatic treatment as advised by her PCP Associated Signs and Symptoms: Patient reports having increase swelling iin her bilateral lower extremities HPI Description: 70 year old with a past medical history significant for MS,  urinary incontinence, and obesity. She has been seen in the wound clinic before for lower extremity ulcerations treated with compression therapy. she is also known to have hypertension, peripheral vascular disease, COPD, obstructive sleep apnea, lumbar radiculopathy, kyphoscoliosis, urinary issues and tobacco abuse. Smokes a packet of cigarettes a day was recently seen at the [facility] for swelling of her legs and feet with a ulceration on the dorsum of the right foot which has been there for about 6 months. she was recently in the ER about a month ago where she was seen for shortness of breath and swelling of the legs and a chest x-ray was within normal limits had an increase in her BNP and was given Lasix and put on doxycycline for a mild cellulitis and possible UTI. Wounds aresmaller today 11/13/15. Debrided and will continue Santyl. 12/21/2015 -- she was admitted to the hospital overnight on 12/18/2015 and diagnosed with urinary retention and cellulitis of the left lower leg. is past to take clindamycin and use Santyl for her wounds. 01/15/2016 -- come back to see [place] for almost a month and continues to be noncompliant with her dressings 01/30/16 patient presents today for a follow-up visit concerning her ongoing bilateral lower extremity wounds. We have not seen her for the past 2 weeks although we are supposed to be seen her for weekly visits. She currently has a Foley catheter. She tells me that the wounds are intensely painful especially with pressure and palpation at this point in time. Fortunately she is having no interval signs or symptoms of systemic infection but unfortunately the wounds have not improved dramatically since we last saw her. She does have home health coming to take care of her as well. She is currently not in any compression wraps. 02/06/16 ON evaluation today patient continues to experience discomfort regarding her bilateral lower extremity ulcers. She has  continued to tolerate the dressing changes at this point in time and continues  to have a Foley catheter as well. Fortunately she is back this week in the past she has been somewhat noncompliant with follow-ups I'm glad to see her today. She tells me that her pain level varies but can be as high as a 7 out of 10 with manipulation of the wound. She tells me that she used to be on oxycodone which was managed by the pain clinic although she is no longer on that as she tells me that she was actually smoking marijuana at the time and when this was found out they discontinued her pain medication. She no longer is taking anything pain medication wise and she also does not smoke cigarettes nor marijuana at this point. 02/12/16; this is a patient I don't believe I have previously seen. She has multiple sclerosis. She has 4 punched-out areas on the anterior lateral left leg 2 areas on the right these are all in the same condition. Reasonably small [dime size] wounds each was some depth. Surface of these wounds does not look particularly healthy as there is adherent slough. There is no evidence of surrounding infection or inflammation. ABIs in this clinic were 0.87 on the right and 0.81 on the left. She is listed as having PAD and is a smoker. Not a diabetic 02/20/16 patient I gave antibiotics to last week for erythema around both wound sites on the left lateral leg and right lateral leg. This appears to be a lot better. One of the areas on the left leg is healed however she still has 3 punched-out open areas on the left lateral calf and one on the right lateral calf and one on the dorsal foot. She is an ex-smoker quitting 1 month ago. ABIs in this clinic were 0.87 on the right 0.81 on the left 03/12/16; this is a patient I really don't have a good sense of. It would appear for the first 5 or 6 weeks of her stay in this clinic she was cared for by Dr. Con Memos. She appeared on my schedule in mid October and I saw  her twice. She has not been here however in 3 weeks. She has advanced Wound Care at home where she lives with her husband. She has multiple sclerosis. I saw her the first time she had 4 punched-out small wounds on the anterior lateral left leg it appears that she is now down to 2. She also had wounds on the lateral and medial aspect of her right leg which were also small and punched out. The only one that remains is on the right lateral. Because of the nature of her wound I went ahead and ordered formal arterial studies this showed an ABI of 1 on the right and one on the left. TBI of 1.4 on the right and 0.79 on the left. She had normal waveforms. She was in the emergency room on 02/28/16; there is a noted edema of her left leg after a fall. She had a duplex ultrasound that showed no evidence for an acute DVT from the left groin to the popliteal fossa. The study was limited in the calf veins due to edema. Also noticeable that she had a left inguinal enlarged lymph node up to 6.2 cm. She also had a left knee x-ray that showed no acute findings and a right foot x-ray that showed soft tissue swelling but no radiographic evidence of osteomyelitis. The patient does stop smoking 03-19-16 Ms Innes presents today for evaluation of her bilateral lower extremity ulcerations. She states  that she has not smoked in several weeks. She denies any pain or discomfort to bilateral lower extremities, tolerating compression therapy as ordered. 03/26/16; I have not seen this patient in 2 weeks however she has done very well with improvements in the areas in question. After we obtain normal arterial studies, increasing the 4-layer compression really seems to look done a nice job here. She has one open area on the left lateral leg and one on the medial right leg. These have filled in nicely and are now superficial wounds that showed epithelialized. I note the left inguinal lymph node at 6.2 cm. I may need to refer this back  to the patient's primary doctor. 04/03/16; patient has 2 remaining wounds 1 on the left lateral leg and one on the right medial leg. Both of filled in nicely since we are able to increase her from a 3 layer to a 4 layer compression. I am also following up on the lymph node notable on her left leg DVT rule out in November 04/10/16; area on the right lateral leg is healed. The area on the left leg is still open but looks considerably better with healthy granulation and less wound area. I12/20/17; last week we healed the patient doubt with regards to the wound on the right leg to her on compression stocking 20-30 mmHg as it turns out I don't think she had a stocking, predictably therefore she has reopened. The left leg as well as the wound on the lateral aspect. Been generally small line 04/24/16; we healed out her right leg 2 weeks ago to her own 20-30 mm compression stockings although as it turns out she didn't have these [name], [name] H. ([id number]) and did not purchase some apparently because of financial issues. The left leg wound is on the lateral aspect. Both of these wounds are just about healed. 05/01/16; her wounds on the left leg are healed out today. The area on the right leg is also healed. Vitamin diligent effort of our staff we have not been able to get any form of compression stocking to this patient. The orders for juxtalite's were given to home health this never materialized. We ordered compression stockings I don't think she was able to afford these. We had a discussion today with both the patient and her husband without these, will accumulate edema and the wounds will simply reopen again. 05/14/16 READMISSION this is a patient we healed out 2 weeks ago. As bilateral lower extremity venous wounds probably some degree of lymphedema. At the beginning in November I did a duplex ultrasound of her left leg that was negative for DVT but did show a lymph node in the left inguinal area  presumably reactive. We've been using compression to her lower extremities eventually healed all her wounds on her bilateral calves but we were not able to get any form of compression stockings for her in spite of intense efforts of our staff. She returns today with reopening on the left leg did not the right she has several superficial areas anteriorly but an actual frank ulcer on the lateral left calf. This is about the size of a dime or smaller. She does not really complain of pain. The patient has a history of PAD however arterial studies we did in November were normal. Her ABIs and Doppler waveforms were both within the normal limits. The patient has a history of MS. In discussing things with her today it would appear that the opening was noted 2 weeks  ago according to the patient. We gave her Tubigrip stockings when she left the clinic 05/21/16; she has not yet had the duplex ultrasound of her thigh, this is booked for Thursday. The wound on the left lateral lower leg is improved 05/28/16; her duplex ultrasound of the left leg specifically her left thigh did not show a DVT however did show hypoechogenic enlarged left inguinal lymph nodes up to 6.9 x 3.7 x 6.8. This also showed in the one in November however maximal diameter was 6.2, at that point I thought these were reactive secondary to cellulitis however there is obviously a differential here. Her wound on the left lower leg is closed. She has a superficial open area on the base of her left fifth metatarsal head on the plantar foot. It looks as though she has some wrap injuries on the anterior leg 06/05/16; since she was last year she had a fall on 1/31. She was seen in the ER but sent back home. I think she is also been seen by her primary physician who picked up on the swelling and the lymphadenopathy. She is ordered a CT scan of the abdomen and pelvis however this will be without contrast because of stage IIIB renal insufficiency. Nevertheless  this should be a start the workup to make sure there is not is more systemic problem here. She will probably need consideration of a biopsy of the lymph node in her inguinal area, this would need general surgery. As far as her wounds are concern today the area on the left plantar foot is healed. She has a small weeping area on the left lower leg. The wrap injury from last week has largely Pardoxically there is a lot less edema in the left thigh. 06/12/16; CT scan is on Friday. She has 2 small open areas one on the lateral left lower leg and one on the anterior lower leg on the left. Both of these looks healthy. I reduced her compression last week to Kerlix and Coban this seems to have not a reasonable job 07/02/16; the patient is now in the skilled facility. I think this was arranged out of the ER when she fell at home although I'm not exactly sure. She is going for I think a laparotomy for the pelvic mass next week. All the wounds in the left leg have healed Readmission: 07/20/2020 upon evaluation today patient presents for reevaluation here in clinic unfortunately she is having issues with a significant stage III pressure ulcer to her left heel. This is something that occurred when she was in the hospital sick. And subsequently though she does not appear to have any new injury she is still trying to recover from the pressure injury which was quite severe during that time. I do not see any signs of infection right now which is great news. With that being said she has been require some debridement to try to clear this up as much as possible. We need to get the wound VAC cleared so that she can see overall healing and improvement that were looking for. The patient does have a history of hypertension, chronic kidney disease stage III, COPD, and multiple sclerosis. Currently she has been trying to do what she can to keep pressure off of her heel while in bed and in other locations as well. 08/04/20 upon  evaluation today patient appears to be doing extremely well currently in regard to her wound on the heel. I do believe the Iodoflex has helped loosen things  appear. Unfortunately she tells me that the home health nurse has not been putting the Iodoflex on the wound. That is news to [place] and that they tell me they got supplies in the mail but they did not see the Iodoflex being used. Nonetheless I am wondering if this is just something that they missed or if it truly has not been. I did tell them to look when they get home in their box of supplies to see if they saw the dressing which showed and what it should look like. 08/11/2020 upon evaluation today patient appears to be doing decently well in regard to her wound although unfortunately this is showing signs of some blue-green drainage consistent with Pseudomonas it does have a little worried in this regard. Fortunately there is no evidence of active infection which is great news. No fevers, chills, nausea, vomiting, or diarrhea. Electronic Signature(s) Signed: 08/11/2020 2:34:49 PM By: Worthy Keeler PA-C Entered By: Worthy Keeler on 08/11/2020 14:34:49 [name] ([id number]) -------------------------------------------------------------------------------- Physical Exam Details Patient Name: [name] Date of Service: 08/11/2020 1:15 PM Medical Record Number: [id number] Patient Account Number: [id number] Date of Birth/Sex: [birth year] (69 y.o. F) Treating RN: Carlene Coria Primary Care Provider: [name] Other Clinician: [name] Referring Provider: [name] Treating Provider/Extender: [name] in Treatment: 3 Constitutional Well-nourished and well-hydrated in no acute distress. Respiratory normal breathing without difficulty. Psychiatric this patient is able to make decisions and demonstrates good insight into disease process. Alert and Oriented x 3. pleasant and cooperative. Notes Patient's  wound bed showed signs of good granulation and epithelization there was some slough noted but again I did not perform sharp debridement today due to the signs of infection again I think this is consistent with Pseudomonas based on what I am seeing drainage wise. Electronic Signature(s) Signed: 08/11/2020 2:35:07 PM By: Worthy Keeler PA-C Entered By: Worthy Keeler on 08/11/2020 14:35:07 [name] ([id number]) -------------------------------------------------------------------------------- Physician Orders Details Patient Name: [name] Date of Service: 08/11/2020 1:15 PM Medical Record Number: [id number] Patient Account Number: [id number] Date of Birth/Sex: [birth year] (69 y.o. F) Treating RN: Carlene Coria Primary Care Provider: [name] Other Clinician: [name] Referring Provider: [name] Treating Provider/Extender: [name] in Treatment: 3 Verbal / Phone Orders: No Diagnosis Coding ICD-10 Coding Code Description [phone] Pressure ulcer of left heel, stage 3 I10 Essential (primary) hypertension N18.30 Chronic kidney disease, stage 3 unspecified J44.9 Chronic obstructive pulmonary disease, unspecified G35 Multiple sclerosis Follow-up Appointments o Return Appointment in 1 week. [place]: - [facility] for wound care. May utilize formulary equivalent dressing for wound treatment orders unless otherwise specified. Home Health Nurse may visit PRN to address patientos wound care needs. o Scheduled days for dressing changes to be completed; exception, patient has scheduled wound care visit that day. - Mondays and Thursdays o **Please direct any NON-WOUND related issues/requests for orders to patient's Primary Care Physician. **If current dressing causes regression in wound condition, may D/C ordered dressing product/s and apply Normal Saline Moist Dressing daily until next [place] or Other MD appointment. **Notify Wound Healing Center of regression in wound condition at [phone]. Edema Control - Lymphedema / Segmental Compressive Device / Other o Optional: One layer of unna paste to top of compression wrap (to act as an anchor). Off-Loading o Open toe surgical shoe o Other: - Keep pressure off left heel Wound Treatment Wound #12 -  Calcaneus Wound Laterality: Left Cleanser: Normal Saline 2 x Per Week/30 Days Discharge Instructions: Wash your hands with soap and water. Remove old dressing, discard into plastic bag and place into trash. Cleanse the wound with Normal Saline prior to applying a clean dressing using gauze sponges, not tissues or cotton balls. Do not scrub or use excessive force. Pat dry using gauze sponges, not tissue or cotton balls. Primary Dressing: IODOFLEX 0.9% Cadexomer Iodine Pad 2 x Per Week/30 Days Discharge Instructions: Apply Iodoflex to wound bed only, extra sent with patient Secondary Dressing: ABD Pad 5x9 (in/in) 2 x Per Week/30 Days Discharge Instructions: Cover with ABD pad Compression Wrap: Profore Lite LF 3 Multilayer Compression Bandaging System 2 x Per Week/30 Days Discharge Instructions: Apply 3 multi-layer wrap as prescribed. Laboratory o Bacteria identified in Wound by Culture (MICRO) - non healing wound with green drainage, pain - (ICD10 L89.623 - Pressure ulcer of left heel, stage 3) oooo LOINC Code: 6462-6 oooo Convenience Name: Wound culture routine [name], [name] H. ([id number]) Patient Medications Allergies: No Known Drug Allergies Notifications Medication Indication Start End Cipro 08/11/2020 DOSE 1 - oral 500 mg tablet - 1 tablet oral taken 2 times per day for 14 days. Patient has taken this before without complication but is aware of risk Electronic Signature(s) Signed: 08/11/2020 2:41:45 PM By: Worthy Keeler PA-C Entered By: Worthy Keeler on 08/11/2020 14:41:44 Marker, [name]  ([id number]) -------------------------------------------------------------------------------- Problem List Details Patient Name: [name] Date of Service: 08/11/2020 1:15 PM Medical Record Number: [id number] Patient Account Number: [id number] Date of Birth/Sex: Mar 08, 1951 (69 y.o. F) Treating RN: Carlene Coria Primary Care Provider: [name] Other Clinician: [name] Referring Provider: [name] Treating Provider/Extender: [name] in Treatment: 3 Active Problems ICD-10 Encounter Code Description Active Date MDM Diagnosis [phone] Pressure ulcer of left heel, stage 3 07/20/2020 No Yes I10 Essential (primary) hypertension 07/20/2020 No Yes N18.30 Chronic kidney disease, stage 3 unspecified 07/20/2020 No Yes J44.9 Chronic obstructive pulmonary disease, unspecified 07/20/2020 No Yes G35 Multiple sclerosis 07/20/2020 No Yes Inactive Problems Resolved Problems Electronic Signature(s) Signed: 08/11/2020 2:09:51 PM By: Worthy Keeler PA-C Entered By: Worthy Keeler on 08/11/2020 14:09:50 [name], [name] ([id number]) -------------------------------------------------------------------------------- Progress Note Details Patient Name: [name] Date of Service: 08/11/2020 1:15 PM Medical Record Number: [id number] Patient Account Number: [id number] Date of Birth/Sex: 05-Oct-1950 (69 y.o. F) Treating RN: Carlene Coria Primary Care Provider: [name] Other Clinician: [name] Referring Provider: [name] Treating Provider/Extender: [name] in Treatment: 3 Subjective Chief Complaint Information obtained from Patient Left Heel Pressure Ulcer History of Present Illness (HPI) The following HPI elements were documented for the patient's wound: Location: left and right lower extremity and dorsum of the right foot Quality: Patient reports experiencing a sharp pain to affected area(s). Severity: Patient rates the pain to  be a 8 out of 10 especially with palpation Duration: Patient has had the wound for > 6 months prior to seeking treatment at the wound center Timing: Pain in wound is Intermittent in severity but persistent Context: The wound appeared gradually over time Modifying Factors: Other treatment(s) tried include: symptomatic treatment as advised by her PCP Associated Signs and Symptoms: Patient reports having increase swelling iin her bilateral lower extremities 70 year old with a past medical history significant for MS, urinary incontinence, and obesity. She has been seen in the wound clinic before for lower extremity ulcerations treated with compression therapy. she is also known to have hypertension, peripheral vascular disease,  COPD, obstructive sleep apnea, lumbar radiculopathy, kyphoscoliosis, urinary issues and tobacco abuse. Smokes a packet of cigarettes a day was recently seen at the [facility] for swelling of her legs and feet with a ulceration on the dorsum of the right foot which has been there for about 6 months. she was recently in the ER about a month ago where she was seen for shortness of breath and swelling of the legs and a chest x-ray was within normal limits had an increase in her BNP and was given Lasix and put on doxycycline for a mild cellulitis and possible UTI. Wounds aresmaller today 11/13/15. Debrided and will continue Santyl. 12/21/2015 -- she was admitted to the hospital overnight on 12/18/2015 and diagnosed with urinary retention and cellulitis of the left lower leg. is past to take clindamycin and use Santyl for her wounds. 01/15/2016 -- come back to see [place] for almost a month and continues to be noncompliant with her dressings 01/30/16 patient presents today for a follow-up visit concerning her ongoing bilateral lower extremity wounds. We have not seen her for the past 2 weeks although we are supposed to be seen her for weekly visits. She currently has a Foley  catheter. She tells me that the wounds are intensely painful especially with pressure and palpation at this point in time. Fortunately she is having no interval signs or symptoms of systemic infection but unfortunately the wounds have not improved dramatically since we last saw her. She does have home health coming to take care of her as well. She is currently not in any compression wraps. 02/06/16 ON evaluation today patient continues to experience discomfort regarding her bilateral lower extremity ulcers. She has continued to tolerate the dressing changes at this point in time and continues to have a Foley catheter as well. Fortunately she is back this week in the past she has been somewhat noncompliant with follow-ups I'm glad to see her today. She tells me that her pain level varies but can be as high as a 7 out of 10 with manipulation of the wound. She tells me that she used to be on oxycodone which was managed by the pain clinic although she is no longer on that as she tells me that she was actually smoking marijuana at the time and when this was found out they discontinued her pain medication. She no longer is taking anything pain medication wise and she also does not smoke cigarettes nor marijuana at this point. 02/12/16; this is a patient I don't believe I have previously seen. She has multiple sclerosis. She has 4 punched-out areas on the anterior lateral left leg 2 areas on the right these are all in the same condition. Reasonably small [dime size] wounds each was some depth. Surface of these wounds does not look particularly healthy as there is adherent slough. There is no evidence of surrounding infection or inflammation. ABIs in this clinic were 0.87 on the right and 0.81 on the left. She is listed as having PAD and is a smoker. Not a diabetic 02/20/16 patient I gave antibiotics to last week for erythema around both wound sites on the left lateral leg and right lateral leg. This appears  to be a lot better. One of the areas on the left leg is healed however she still has 3 punched-out open areas on the left lateral calf and one on the right lateral calf and one on the dorsal foot. She is an ex-smoker quitting 1 month ago. ABIs  in this clinic were 0.87 on the right 0.81 on the left 03/12/16; this is a patient I really don't have a good sense of. It would appear for the first 5 or 6 weeks of her stay in this clinic she was cared for by Dr. Con Memos. She appeared on my schedule in mid October and I saw her twice. She has not been here however in 3 weeks. She has advanced Wound Care at home where she lives with her husband. She has multiple sclerosis. I saw her the first time she had 4 punched-out small wounds on the anterior lateral left leg it appears that she is now down to 2. She also had wounds on the lateral and medial aspect of her right leg which were also small and punched out. The only one that remains is on the right lateral. Because of the nature of her wound I went ahead and ordered formal arterial studies this showed an ABI of 1 on the right and one on the left. TBI of 1.4 on the right and 0.79 on the left. She had normal waveforms. She was in the emergency room on 02/28/16; there is a noted edema of her left leg after a fall. She had a duplex ultrasound that showed no evidence for an acute DVT from the left groin to the popliteal fossa. The study was limited in the calf veins due to edema. Also noticeable that she had a left inguinal enlarged lymph node up to 6.2 cm. She also had a left knee x-ray that showed no acute findings and a right foot x-ray that showed soft tissue swelling but no radiographic evidence of osteomyelitis. The patient does stop smoking 03-19-16 Ms Nolden presents today for evaluation of her bilateral lower extremity ulcerations. She states that she has not smoked in several weeks. She denies any pain or discomfort to bilateral lower extremities, tolerating  compression therapy as ordered. 03/26/16; I have not seen this patient in 2 weeks however she has done very well with improvements in the areas in question. After we obtain normal arterial studies, increasing the 4-layer compression really seems to look done a nice job here. She has one open area on the left lateral leg and one on the medial right leg. These have filled in nicely and are now superficial wounds that showed epithelialized. I note the left inguinal lymph node at 6.2 cm. I may need to refer this back to the patient's primary doctor. 04/03/16; patient has 2 remaining wounds 1 on the left lateral leg and one on the right medial leg. Both of filled in nicely since we are able to increase her from a 3 layer to a 4 layer compression. I am also following up on the lymph node notable on her left leg DVT rule out in November [name], [name]. ([id number]) 04/10/16; area on the right lateral leg is healed. The area on the left leg is still open but looks considerably better with healthy granulation and less wound area. I12/20/17; last week we healed the patient doubt with regards to the wound on the right leg to her on compression stocking 20-30 mmHg as it turns out I don't think she had a stocking, predictably therefore she has reopened. The left leg as well as the wound on the lateral aspect. Been generally small line 04/24/16; we healed out her right leg 2 weeks ago to her own 20-30 mm compression stockings although as it turns out she didn't have these and did not purchase  some apparently because of financial issues. The left leg wound is on the lateral aspect. Both of these wounds are just about healed. 05/01/16; her wounds on the left leg are healed out today. The area on the right leg is also healed. Vitamin diligent effort of our staff we have not been able to get any form of compression stocking to this patient. The orders for juxtalite's were given to home health this never materialized.  We ordered compression stockings I don't think she was able to afford these. We had a discussion today with both the patient and her husband without these, will accumulate edema and the wounds will simply reopen again. 05/14/16 READMISSION this is a patient we healed out 2 weeks ago. As bilateral lower extremity venous wounds probably some degree of lymphedema. At the beginning in November I did a duplex ultrasound of her left leg that was negative for DVT but did show a lymph node in the left inguinal area presumably reactive. We've been using compression to her lower extremities eventually healed all her wounds on her bilateral calves but we were not able to get any form of compression stockings for her in spite of intense efforts of our staff. She returns today with reopening on the left leg did not the right she has several superficial areas anteriorly but an actual frank ulcer on the lateral left calf. This is about the size of a dime or smaller. She does not really complain of pain. The patient has a history of PAD however arterial studies we did in November were normal. Her ABIs and Doppler waveforms were both within the normal limits. The patient has a history of MS. In discussing things with her today it would appear that the opening was noted 2 weeks ago according to the patient. We gave her Tubigrip stockings when she left the clinic 05/21/16; she has not yet had the duplex ultrasound of her thigh, this is booked for Thursday. The wound on the left lateral lower leg is improved 05/28/16; her duplex ultrasound of the left leg specifically her left thigh did not show a DVT however did show hypoechogenic enlarged left inguinal lymph nodes up to 6.9 x 3.7 x 6.8. This also showed in the one in November however maximal diameter was 6.2, at that point I thought these were reactive secondary to cellulitis however there is obviously a differential here. Her wound on the left lower leg is closed. She  has a superficial open area on the base of her left fifth metatarsal head on the plantar foot. It looks as though she has some wrap injuries on the anterior leg 06/05/16; since she was last year she had a fall on 1/31. She was seen in the ER but sent back home. I think she is also been seen by her primary physician who picked up on the swelling and the lymphadenopathy. She is ordered a CT scan of the abdomen and pelvis however this will be without contrast because of stage IIIB renal insufficiency. Nevertheless this should be a start the workup to make sure there is not is more systemic problem here. She will probably need consideration of a biopsy of the lymph node in her inguinal area, this would need general surgery. As far as her wounds are concern today the area on the left plantar foot is healed. She has a small weeping area on the left lower leg. The wrap injury from last week has largely Pardoxically there is a lot less edema  in the left thigh. 06/12/16; CT scan is on Friday. She has 2 small open areas one on the lateral left lower leg and one on the anterior lower leg on the left. Both of these looks healthy. I reduced her compression last week to Kerlix and Coban this seems to have not a reasonable job 07/02/16; the patient is now in the skilled facility. I think this was arranged out of the ER when she fell at home although I'm not exactly sure. She is going for I think a laparotomy for the pelvic mass next week. All the wounds in the left leg have healed Readmission: 07/20/2020 upon evaluation today patient presents for reevaluation here in clinic unfortunately she is having issues with a significant stage III pressure ulcer to her left heel. This is something that occurred when she was in the hospital sick. And subsequently though she does not appear to have any new injury she is still trying to recover from the pressure injury which was quite severe during that time. I do not see any signs  of infection right now which is great news. With that being said she has been require some debridement to try to clear this up as much as possible. We need to get the wound VAC cleared so that she can see overall healing and improvement that were looking for. The patient does have a history of hypertension, chronic kidney disease stage III, COPD, and multiple sclerosis. Currently she has been trying to do what she can to keep pressure off of her heel while in bed and in other locations as well. 08/04/20 upon evaluation today patient appears to be doing extremely well currently in regard to her wound on the heel. I do believe the Iodoflex has helped loosen things appear. Unfortunately she tells me that the home health nurse has not been putting the Iodoflex on the wound. That is news to [place] and that they tell me they got supplies in the mail but they did not see the Iodoflex being used. Nonetheless I am wondering if this is just something that they missed or if it truly has not been. I did tell them to look when they get home in their box of supplies to see if they saw the dressing which showed and what it should look like. 08/11/2020 upon evaluation today patient appears to be doing decently well in regard to her wound although unfortunately this is showing signs of some blue-green drainage consistent with Pseudomonas it does have a little worried in this regard. Fortunately there is no evidence of active infection which is great news. No fevers, chills, nausea, vomiting, or diarrhea. Objective Constitutional Well-nourished and well-hydrated in no acute distress. Vitals Time Taken: 1:52 PM, Height: 64 in, Weight: 265 lbs, BMI: 45.5, Temperature: 98.1 F, Pulse: 88 bpm, Respiratory Rate: 18 breaths/min, Blood Pressure: 105/62 mmHg. Respiratory normal breathing without difficulty. [name], [name] ([id number]) Psychiatric this patient is able to make decisions and demonstrates good insight into  disease process. Alert and Oriented x 3. pleasant and cooperative. General Notes: Patient's wound bed showed signs of good granulation and epithelization there was some slough noted but again I did not perform sharp debridement today due to the signs of infection again I think this is consistent with Pseudomonas based on what I am seeing drainage wise. Integumentary (Hair, Skin) Wound #12 status is Open. Original cause of wound was Pressure Injury. The date acquired was: 06/27/2020. The wound has been in treatment 3 weeks. The  wound is located on the Left Calcaneus. The wound measures 1.8cm length x 2.4cm width x 0.2cm depth; 3.393cm^2 area and 0.679cm^3 volume. There is Fat Layer (Subcutaneous Tissue) exposed. There is no tunneling or undermining noted. There is a medium amount of purulent drainage noted. There is medium (34-66%) red, pink granulation within the wound bed. There is a medium (34-66%) amount of necrotic tissue within the wound bed including Adherent Slough. Assessment Active Problems ICD-10 Pressure ulcer of left heel, stage 3 Essential (primary) hypertension Chronic kidney disease, stage 3 unspecified Chronic obstructive pulmonary disease, unspecified Multiple sclerosis Procedures Wound #12 Pre-procedure diagnosis of Wound #12 is a Pressure Ulcer located on the Left Calcaneus . There was a Three Layer Compression Therapy Procedure by Carlene Coria, RN. Post procedure Diagnosis Wound #12: Same as Pre-Procedure Plan Follow-up Appointments: Return Appointment in 1 week. Home Health: [facility] for wound care. May utilize formulary equivalent dressing for wound treatment orders unless otherwise specified. Home Health Nurse may visit PRN to address patient s wound care needs. Scheduled days for dressing changes to be completed; exception, patient has scheduled wound care visit that day. - Mondays and Thursdays **Please direct any  NON-WOUND related issues/requests for orders to patient's Primary Care Physician. **If current dressing causes regression in wound condition, may D/C ordered dressing product/s and apply Normal Saline Moist Dressing daily until next [facility] or Other MD appointment. **Notify Wound Healing Center of regression in wound condition at [phone]. Edema Control - Lymphedema / Segmental Compressive Device / Other: Optional: One layer of unna paste to top of compression wrap (to act as an anchor). Off-Loading: Open toe surgical shoe Other: - Keep pressure off left heel Laboratory ordered were: Wound culture routine - non healing wound with green drainage, pain The following medication(s) was prescribed: Cipro oral 500 mg tablet 1 1 tablet oral taken 2 times per day for 14 days. Patient has taken this before without complication but is aware of risk starting 08/11/2020 WOUND #12: - Calcaneus Wound Laterality: Left Cleanser: Normal Saline 2 x Per Week/30 Days Discharge Instructions: Wash your hands with soap and water. Remove old dressing, discard into plastic bag and place into trash. Cleanse the wound with Normal Saline prior to applying a clean dressing using gauze sponges, not tissues or cotton balls. Do not scrub or use excessive force. Pat dry using gauze sponges, not tissue or cotton balls. Primary Dressing: IODOFLEX 0.9% Cadexomer Iodine Pad 2 x Per Week/30 Days Discharge Instructions: Apply Iodoflex to wound bed only, extra sent with patient Secondary Dressing: ABD Pad 5x9 (in/in) 2 x Per Week/30 Days [name], [name] H. ([id number]) Discharge Instructions: Cover with ABD pad Compression Wrap: Profore Lite LF 3 Multilayer Compression Bandaging System 2 x Per Week/30 Days Discharge Instructions: Apply 3 multi-layer wrap as prescribed. 1. Would recommend currently that we go ahead and continue with the Iodosorb/Iodoflex I think that still the best way to go. 2. I am also can recommend  that we have the patient continue with the compression wrap. We have been utilizing a 3 layer compression wrap. 3. I am also can recommend that we go ahead and see about getting her placed on Cipro to help with the infection. I discussed that with her today she tells me she has taken this without any complications I think that is great news to and so we will go ahead and get this taken care of for her as well. Hopefully she  will be doing significantly better by next week. We will see as well what the wound culture shows. We will see patient back for reevaluation in 1 week here in the clinic. If anything worsens or changes patient will contact our office for additional recommendations. Electronic Signature(s) Signed: 08/11/2020 2:42:02 PM By: Worthy Keeler PA-C Entered By: Worthy Keeler on 08/11/2020 14:42:02 [name], [name] ([id number]) -------------------------------------------------------------------------------- SuperBill Details Patient Name: [name] Date of Service: 08/11/2020 Medical Record Number: [id number] Patient Account Number: [id number] Date of Birth/Sex: 04/01/1951 (69 y.o. F) Treating RN: Carlene Coria Primary Care Provider: [name] Other Clinician: [name] Referring Provider: [name] Treating Provider/Extender: [name] in Treatment: 3 Diagnosis Coding ICD-10 Codes Code Description 3208421499 Pressure ulcer of left heel, stage 3 I10 Essential (primary) hypertension N18.30 Chronic kidney disease, stage 3 unspecified J44.9 Chronic obstructive pulmonary disease, unspecified G35 Multiple sclerosis Facility Procedures CPT4 Code: [id number] Description: (Facility Use Only) [phone] - [facility] Modifier: Quantity: 1 Physician Procedures CPT4 Code: [id number] Description: 74142 - WC PHYS LEVEL 4 - EST PT Modifier: Quantity: 1 CPT4 Code: Description: ICD-10 Diagnosis Description L89.623 Pressure ulcer of left  heel, stage 3 I10 Essential (primary) hypertension N18.30 Chronic kidney disease, stage 3 unspecified J44.9 Chronic obstructive pulmonary disease, unspecified Modifier: Quantity: Electronic Signature(s) Signed: 08/11/2020 2:42:13 PM By: Worthy Keeler PA-C Entered By: Worthy Keeler on 08/11/2020 14:42:13

## 2020-08-14 NOTE — Progress Notes (Signed)
KASH, MOTHERSHEAD ([id number]) Visit Report for 08/11/2020 Arrival Information Details Patient Name: [name], [name] Date of Service: 08/11/2020 1:15 PM Medical Record Number: [id number] Patient Account Number: [id number] Date of Birth/Sex: 1951/01/05 (70 y.o. F) Treating RN: Carlene Coria Primary Care Greysen Swanton: [name] Other Clinician: [name] Referring Xuan Mateus: [name] Treating Ranell Finelli/Extender: [name] in Treatment: 3 Visit Information History Since Last Visit Added or deleted any medications: No Patient Arrived: [name] Had a fall or experienced change in No Arrival Time: 13:41 activities of daily living that may affect Accompanied By: husband risk of falls: Transfer Assistance: None Hospitalized since last visit: No Patient Identification Verified: Yes Pain Present Now: Yes Secondary Verification Process Completed: Yes Electronic Signature(s) Signed: 08/11/2020 4:49:43 PM By: [name] Entered By: [name] on 08/11/2020 13:52:19 Richer, [name] ([id number]) -------------------------------------------------------------------------------- Clinic Level of Care Assessment Details Patient Name: [name] Date of Service: 08/11/2020 1:15 PM Medical Record Number: [id number] Patient Account Number: [id number] Date of Birth/Sex: [birth year] (70 y.o. F) Treating RN: Carlene Coria Primary Care Verda Mehta: [name] Other Clinician: [name] Referring Mattilyn Crites: [name] Treating Kaile Bixler/Extender: [name] in Treatment: 3 Clinic Level of Care Assessment Items TOOL 1 Quantity Score _0  - Use when EandM and Procedure is performed on INITIAL visit 0 ASSESSMENTS - Nursing Assessment / Reassessment _1  - General Physical Exam (combine w/ comprehensive assessment (listed just below) when performed on new 0 pt. evals) _2  - 0 Comprehensive Assessment (HX, ROS, Risk Assessments, Wounds Hx, etc.) ASSESSMENTS  - Wound and Skin Assessment / Reassessment _3  - Dermatologic / Skin Assessment (not related to wound area) 0 ASSESSMENTS - Ostomy and/or Continence Assessment and Care _4  - Incontinence Assessment and Management 0 _5  - 0 Ostomy Care Assessment and Management (repouching, etc.) PROCESS - Coordination of Care _6  - Simple Patient / Family Education for ongoing care 0 _7  - 0 Complex (extensive) Patient / Family Education for ongoing care _8  - 0 Staff obtains Programmer, systems, Records, Test Results / Process Orders _9  - 0 Staff telephones HHA, Nursing Homes / Clarify orders / etc _10  - 0 Routine Transfer to another Facility (non-emergent condition) _11  - 0 Routine Hospital Admission (non-emergent condition) _12  - 0 New Admissions / Biomedical engineer / Ordering NPWT, Apligraf, etc. _13  - 0 Emergency Hospital Admission (emergent condition) PROCESS - Special Needs _14  - Pediatric / Minor Patient Management 0 _15  - 0 Isolation Patient Management _16  - 0 Hearing / Language / Visual special needs _17  - 0 Assessment of Community assistance (transportation, D/C planning, etc.) _18  - 0 Additional assistance / Altered mentation _19  - 0 Support Surface(s) Assessment (bed, cushion, seat, etc.) INTERVENTIONS - Miscellaneous _20  - External ear exam 0 _21  - 0 Patient Transfer (multiple staff / Civil Service fast streamer / Similar devices) _22  - 0 Simple Staple / Suture removal (25 or less) _23  - 0 Complex Staple / Suture removal (26 or more) _24  - 0 Hypo/Hyperglycemic Management (do not check if billed separately) _25  - 0 Ankle / Brachial Index (ABI) - do not check if billed separately Has the patient been seen at the hospital within the last three years: Yes Total Score: 0 Level Of Care: ____ [name] ([id number]) Electronic Signature(s) Signed: 08/14/2020 7:55:11 AM By: Carlene Coria RN Entered By: Carlene Coria on 08/11/2020 14:41:18 Santibanez, [name]  ([id number]) -------------------------------------------------------------------------------- Compression Therapy Details Patient Name: [name] Date of Service: 08/11/2020 1:15 PM Medical Record Number: [id number] Patient Account Number: [id number] Date of Birth/Sex: August 08, 1950 (70  y.o. F) Treating RN: Carlene Coria Primary Care Yuki Brunsman: [name] Other Clinician: [name] Referring Delane Wessinger: [name] Treating Rosaria Kubin/Extender: [name] in Treatment: 3 Compression Therapy Performed for Wound Assessment: Wound #12 Left Calcaneus Performed By: Clinician Carlene Coria, RN Compression Type: Three Layer Post Procedure Diagnosis Same as Pre-procedure Electronic Signature(s) Signed: 08/14/2020 7:55:11 AM By: Carlene Coria RN Entered By: Carlene Coria on 08/11/2020 14:41:00 [name] ([id number]) -------------------------------------------------------------------------------- Encounter Discharge Information Details Patient Name: [name] Date of Service: 08/11/2020 1:15 PM Medical Record Number: [id number] Patient Account Number: [id number] Date of Birth/Sex: 21-Sep-1950 (70 y.o. F) Treating RN: Donnamarie Poag Primary Care Georgio Hattabaugh: [name] Other Clinician: [name] Referring Harace Mccluney: [name] Treating Kevaughn Ewing/Extender: [name] in Treatment: 3 Encounter Discharge Information Items Discharge Condition: Stable Ambulatory Status: Walker Discharge Destination: Home Transportation: Private Auto Accompanied By: husband Schedule Follow-up Appointment: Yes Clinical Summary of Care: Electronic Signature(s) Signed: 08/11/2020 3:53:44 PM By: Donnamarie Poag Entered By: Donnamarie Poag on 08/11/2020 14:48:08 [name] ([id number]) -------------------------------------------------------------------------------- Lower Extremity Assessment Details Patient Name: [name] Date of Service: 08/11/2020 1:15  PM Medical Record Number: [id number] Patient Account Number: [id number] Date of Birth/Sex: 1951/03/12 (70 y.o. F) Treating RN: Carlene Coria Primary Care Rosamae Rocque: [name] Other Clinician: [name] Referring Virdell Hoiland: [name] Treating Morgann Woodburn/Extender: [name] Weeks in Treatment: 3 Edema Assessment Assessed: [Left: No] [Right: No] [Left: Edema] [Right: :] Calf Left: Right: Point of Measurement: 36 cm From Medial Instep 38.2 cm Ankle Left: Right: Point of Measurement: 10 cm From Medial Instep 19.9 cm Vascular Assessment Pulses: Dorsalis Pedis Palpable: [Left:Yes] Electronic Signature(s) Signed: 08/11/2020 4:49:43 PM By: [name] Signed: 08/14/2020 7:55:11 AM By: Carlene Coria RN Entered By: [name] on 08/11/2020 14:06:45 Wickliff, [name] ([id number]) -------------------------------------------------------------------------------- Multi Wound Chart Details Patient Name: [name] Date of Service: 08/11/2020 1:15 PM Medical Record Number: [id number] Patient Account Number: [id number] Date of Birth/Sex: Oct 03, 1950 (70 y.o. F) Treating RN: Carlene Coria Primary Care Xin Klawitter: [name] Other Clinician: [name] Referring Marylene Masek: [name] Treating Kayshaun Polanco/Extender: [name] in Treatment: 3 Vital Signs Height(in): 27 Pulse(bpm): 16 Weight(lbs): 265 Blood Pressure(mmHg): 105/62 Body Mass Index(BMI): 45 Temperature(F): 98.1 Respiratory Rate(breaths/min): 18 Photos: [N/A:N/A] Wound Location: Left Calcaneus N/A N/A Wounding Event: Pressure Injury N/A N/A Primary Etiology: Pressure Ulcer N/A N/A Comorbid History: Cataracts, Chronic Obstructive N/A N/A Pulmonary Disease (COPD), Sleep Apnea, Hypertension, Osteoarthritis, Neuropathy Date Acquired: 06/27/2020 N/A N/A Weeks of Treatment: 3 N/A N/A Wound Status: Open N/A N/A Measurements L x W x D (cm) 1.8x2.4x0.2 N/A N/A Area (cm) : 3.393 N/A  N/A Volume (cm) : 0.679 N/A N/A % Reduction in Area: 33.50% N/A N/A % Reduction in Volume: 33.50% N/A N/A Classification: Category/Stage III N/A N/A Exudate Amount: Medium N/A N/A Exudate Type: Purulent N/A N/A Exudate Color: yellow, brown, green N/A N/A Granulation Amount: Medium (34-66%) N/A N/A Granulation Quality: Red, Pink N/A N/A Necrotic Amount: Medium (34-66%) N/A N/A Exposed Structures: Fat Layer (Subcutaneous Tissue): N/A N/A Yes Fascia: No Tendon: No Muscle: No Joint: No Bone: No Epithelialization: None N/A N/A Treatment Notes Electronic Signature(s) Signed: 08/11/2020 4:49:43 PM By: [name] Entered By: [name] on 08/11/2020 14:27:47 [name] ([id number]) -------------------------------------------------------------------------------- Multi-Disciplinary Care Plan Details Patient Name: [name] Date of Service: 08/11/2020 1:15 PM Medical Record Number: [id number] Patient Account Number: [id number] Date of Birth/Sex: 08/25/1950 (70 y.o. F) Treating RN: Carlene Coria Primary Care Amma Crear: [name] Other Clinician: [name] Referring  Madiha Bambrick: [name] Treating Rufus Beske/Extender: [name] in Treatment: 3 Active Inactive Pressure Nursing Diagnoses: Knowledge deficit related to causes and risk factors for pressure ulcer development Knowledge deficit related to management of pressures ulcers Goals: Patient will remain free from development of additional pressure ulcers Date Initiated: 07/20/2020 Target Resolution Date: 08/20/2020 Goal Status: Active Patient will remain free of pressure ulcers Date Initiated: 07/20/2020 Target Resolution Date: 08/20/2020 Goal Status: Active Patient/caregiver will verbalize risk factors for pressure ulcer development Date Initiated: 07/20/2020 Date Inactivated: 08/04/2020 Target Resolution Date: 07/20/2020 Goal Status: Met Patient/caregiver will verbalize understanding of  pressure ulcer management Date Initiated: 07/20/2020 Date Inactivated: 08/04/2020 Target Resolution Date: 07/20/2020 Goal Status: Met Interventions: Assess: immobility, friction, shearing, incontinence upon admission and as needed Assess offloading mechanisms upon admission and as needed Assess potential for pressure ulcer upon admission and as needed Notes: Wound/Skin Impairment Nursing Diagnoses: Impaired tissue integrity Goals: Patient/caregiver will verbalize understanding of skin care regimen Date Initiated: 07/20/2020 Target Resolution Date: 08/20/2020 Goal Status: Active Ulcer/skin breakdown will have a volume reduction of 30% by week 4 Date Initiated: 07/20/2020 Target Resolution Date: 08/20/2020 Goal Status: Active Ulcer/skin breakdown will have a volume reduction of 50% by week 8 Date Initiated: 07/20/2020 Target Resolution Date: 09/19/2020 Goal Status: Active Ulcer/skin breakdown will have a volume reduction of 80% by week 12 Date Initiated: 07/20/2020 Target Resolution Date: 10/20/2020 Goal Status: Active Ulcer/skin breakdown will heal within 14 weeks Date Initiated: 07/20/2020 Target Resolution Date: 11/19/2020 Goal Status: Active Interventions: Assess patient/caregiver ability to obtain necessary supplies Assess patient/caregiver ability to perform ulcer/skin care regimen upon admission and as needed Provide education on ulcer and skin care KELCE, BOUTON ([id number]) Treatment Activities: Referred to DME Malcomb Gangemi for dressing supplies : 07/20/2020 Skin care regimen initiated : 07/20/2020 Notes: Electronic Signature(s) Signed: 08/11/2020 4:49:43 PM By: [name] Signed: 08/14/2020 7:55:11 AM By: Carlene Coria RN Entered By: [name] on 08/11/2020 14:27:29 [name] ([id number]) -------------------------------------------------------------------------------- Pain Assessment Details Patient Name: [name] Date of Service: 08/11/2020 1:15 PM Medical  Record Number: [id number] Patient Account Number: [id number] Date of Birth/Sex: 1950-11-06 (70 y.o. F) Treating RN: Carlene Coria Primary Care Tiandra Swoveland: [name] Other Clinician: [name] Referring Marylee Belzer: [name] Treating Azula Zappia/Extender: [name] in Treatment: 3 Active Problems Location of Pain Severity and Description of Pain Patient Has Paino Yes Site Locations Rate the pain. Current Pain Level: 4 Pain Management and Medication Current Pain Management: Electronic Signature(s) Signed: 08/11/2020 4:49:43 PM By: [name] Signed: 08/14/2020 7:55:11 AM By: Carlene Coria RN Entered By: [name] on 08/11/2020 13:53:54 [name] ([id number]) -------------------------------------------------------------------------------- Patient/Caregiver Education Details Patient Name: [name] Date of Service: 08/11/2020 1:15 PM Medical Record Number: [id number] Patient Account Number: [id number] Date of Birth/Gender: Sep 18, 1950 (70 y.o. F) Treating RN: Carlene Coria Primary Care Physician: [name] Other Clinician: [name] Referring Physician: [name] Treating Physician/Extender: [name] in Treatment: 3 Education Assessment Education Provided To: Patient Education Topics Provided Wound/Skin Impairment: Methods: Explain/Verbal Responses: State content correctly Electronic Signature(s) Signed: 08/11/2020 4:49:43 PM By: [name] Entered By: [name] on 08/11/2020 14:31:17 Gillaspie, [name] ([id number]) -------------------------------------------------------------------------------- Wound Assessment Details Patient Name: [name] Date of Service: 08/11/2020 1:15 PM Medical Record Number: [id number] Patient Account Number: [id number] Date of Birth/Sex: 1951-01-13 (70 y.o. F) Treating RN: Carlene Coria Primary Care Mairin Lindsley: [name] Other Clinician: [name] Referring Letonia Stead: [name] Treating Laurann Mcmorris/Extender: [name] Weeks in Treatment: 3 Wound Status Wound  Number: 12 Primary Pressure Ulcer Etiology: Wound Location: Left Calcaneus Wound Open Wounding Event: Pressure Injury Status: Date Acquired: 06/27/2020 Comorbid Cataracts, Chronic Obstructive Pulmonary Disease Weeks Of Treatment: 3 History: (COPD), Sleep Apnea, Hypertension, Osteoarthritis, Clustered Wound: No Neuropathy Photos Wound Measurements Length: (cm) 1.8 Width: (cm) 2.4 Depth: (cm) 0.2 Area: (cm) 3.393 Volume: (cm) 0.679 % Reduction in Area: 33.5% % Reduction in Volume: 33.5% Epithelialization: None Tunneling: No Undermining: No Wound Description Classification: Category/Stage III Exudate Amount: Medium Exudate Type: Purulent Exudate Color: yellow, brown, green Foul Odor After Cleansing: No Slough/Fibrino Yes Wound Bed Granulation Amount: Medium (34-66%) Exposed Structure Granulation Quality: Red, Pink Fascia Exposed: No Necrotic Amount: Medium (34-66%) Fat Layer (Subcutaneous Tissue) Exposed: Yes Necrotic Quality: Adherent Slough Tendon Exposed: No Muscle Exposed: No Joint Exposed: No Bone Exposed: No Treatment Notes Wound #12 (Calcaneus) Wound Laterality: Left Cleanser Normal Saline Discharge Instruction: Wash your hands with soap and water. Remove old dressing, discard into plastic bag and place into trash. Cleanse the wound with Normal Saline prior to applying a clean dressing using gauze sponges, not tissues or cotton balls. Do not scrub or use excessive force. Pat dry using gauze sponges, not tissue or cotton balls. NORMA, MONTEMURRO ([id number]) Peri-Wound Care Topical Primary Dressing IODOFLEX 0.9% Cadexomer Iodine Pad Discharge Instruction: Apply Iodoflex to wound bed only, extra sent with patient Secondary Dressing ABD Pad 5x9 (in/in) Discharge Instruction: Cover with ABD pad Secured With Compression Wrap Profore  Lite LF 3 Multilayer Compression Bandaging System Discharge Instruction: Apply 3 multi-layer wrap as prescribed. Compression Stockings Add-Ons Electronic Signature(s) Signed: 08/11/2020 4:49:43 PM By: [name] Signed: 08/14/2020 7:55:11 AM By: Carlene Coria RN Entered By: [name] on 08/11/2020 14:05:38 [name] ([id number]) -------------------------------------------------------------------------------- Vitals Details Patient Name: [name] Date of Service: 08/11/2020 1:15 PM Medical Record Number: [id number] Patient Account Number: [id number] Date of Birth/Sex: 1950/12/25 (70 y.o. F) Treating RN: Carlene Coria Primary Care Kian Gamarra: [name] Other Clinician: [name] Referring Margert Edsall: [name] Treating Kanisha Duba/Extender: [name] in Treatment: 3 Vital Signs Time Taken: 13:52 Temperature (F): 98.1 Height (in): 64 Pulse (bpm): 88 Weight (lbs): 265 Respiratory Rate (breaths/min): 18 Body Mass Index (BMI): 45.5 Blood Pressure (mmHg): 105/62 Reference Range: 80 - 120 mg / dl Electronic Signature(s) Signed: 08/11/2020 4:49:43 PM By: [name] Entered By: [name] on 08/11/2020 13:52:46

## 2020-08-15 LAB — AEROBIC CULTURE W GRAM STAIN (SUPERFICIAL SPECIMEN): Gram Stain: NONE SEEN

## 2020-08-18 ENCOUNTER — Other Ambulatory Visit: Payer: Self-pay

## 2020-08-18 ENCOUNTER — Encounter: Payer: Medicare Other | Admitting: Physician Assistant

## 2020-08-18 DIAGNOSIS — L89623 Pressure ulcer of left heel, stage 3: Secondary | ICD-10-CM | POA: Diagnosis not present

## 2020-08-18 NOTE — Progress Notes (Addendum)
[name] Visit Report for 08/18/2020 Chief Complaint Document Details Patient Name: [name], [name] Date of Service: 08/18/2020 2:00 PM Medical Record Number: [id number] Patient Account Number: [id number] Date of Birth/Sex: 08-01-1950 (69 y.o. F) Treating RN: Carlene Coria Primary Care Provider: [name] Other Clinician: Referring Provider: [name] Treating Provider/Extender: [name] in Treatment: 4 Information Obtained from: Patient Chief Complaint Left Heel Pressure Ulcer Electronic Signature(s) Signed: 08/18/2020 2:40:20 PM By: Worthy Keeler PA-C Entered By: Worthy Keeler on 08/18/2020 14:40:20 Bonito, [name] ([id number]) -------------------------------------------------------------------------------- Debridement Details Patient Name: [name] Date of Service: 08/18/2020 2:00 PM Medical Record Number: [id number] Patient Account Number: [id number] Date of Birth/Sex: 1951/02/20 (69 y.o. F) Treating RN: Carlene Coria Primary Care Provider: [name] Other Clinician: Referring Provider: [name] Treating Provider/Extender: [name] in Treatment: 4 Debridement Performed for Wound #12 Left Calcaneus Assessment: Performed By: Physician Tommie Sams., PA-C Debridement Type: Debridement Level of Consciousness (Pre- Awake and Alert procedure): Pre-procedure Verification/Time Out Yes - 14:50 Taken: Start Time: 14:50 Pain Control: Lidocaine 4% Topical Solution Total Area Debrided (L x W): 2.3 (cm) x 2.5 (cm) = 5.75 (cm) Tissue and other material Viable, Non-Viable, Callus, Slough, Subcutaneous, Skin: Dermis , Skin: Epidermis, Slough debrided: Level: Skin/Subcutaneous Tissue Debridement Description: Excisional Instrument: Curette Bleeding: Moderate Hemostasis Achieved: Pressure End Time: 14:56 Procedural Pain: 0 Post Procedural Pain: 0 Response to Treatment: Procedure was tolerated well Level  of Consciousness (Post- Awake and Alert procedure): Post Debridement Measurements of Total Wound Length: (cm) 2.3 Stage: Category/Stage III Width: (cm) 2.5 Depth: (cm) 0.2 Volume: (cm) 0.903 Character of Wound/Ulcer Post Debridement: Improved Post Procedure Diagnosis Same as Pre-procedure Electronic Signature(s) Signed: 08/18/2020 5:11:44 PM By: Worthy Keeler PA-C Signed: 08/23/2020 3:51:10 PM By: Carlene Coria RN Entered By: Carlene Coria on 08/18/2020 14:53:52 Marmol, [name] ([id number]) -------------------------------------------------------------------------------- HPI Details Patient Name: [name] Date of Service: 08/18/2020 2:00 PM Medical Record Number: [id number] Patient Account Number: [id number] Date of Birth/Sex: 08-29-1950 (69 y.o. F) Treating RN: Carlene Coria Primary Care Provider: [name] Other Clinician: Referring Provider: [name] Treating Provider/Extender: [name] in Treatment: 4 History of Present Illness Location: left and right lower extremity and dorsum of the right foot Quality: Patient reports experiencing a sharp pain to affected area(s). Severity: Patient rates the pain to be a 8 out of 10 especially with palpation Duration: Patient has had the wound for > 6 months prior to seeking treatment at the wound center Timing: Pain in wound is Intermittent in severity but persistent Context: The wound appeared gradually over time Modifying Factors: Other treatment(s) tried include: symptomatic treatment as advised by her PCP Associated Signs and Symptoms: Patient reports having increase swelling iin her bilateral lower extremities HPI Description: 70 year old with a past medical history significant for MS, urinary incontinence, and obesity. She has been seen in the wound clinic before for lower extremity ulcerations treated with compression therapy. she is also known to have hypertension, peripheral vascular disease, COPD,  obstructive sleep apnea, lumbar radiculopathy, kyphoscoliosis, urinary issues and tobacco abuse. Smokes a packet of cigarettes a day was recently seen at the [facility] for swelling of her legs and feet with a ulceration on the dorsum of the right foot which has been there for about 6 months. she was recently in the ER about a month ago where she was seen for shortness of breath and swelling of the legs and a chest x-ray was within normal  limits had an increase in her BNP and was given Lasix and put on doxycycline for a mild cellulitis and possible UTI. Wounds aresmaller today 11/13/15. Debrided and will continue Santyl. 12/21/2015 -- she was admitted to the hospital overnight on 12/18/2015 and diagnosed with urinary retention and cellulitis of the left lower leg. is past to take clindamycin and use Santyl for her wounds. 01/15/2016 -- come back to see [place] for almost a month and continues to be noncompliant with her dressings 01/30/16 patient presents today for a follow-up visit concerning her ongoing bilateral lower extremity wounds. We have not seen her for the past 2 weeks although we are supposed to be seen her for weekly visits. She currently has a Foley catheter. She tells me that the wounds are intensely painful especially with pressure and palpation at this point in time. Fortunately she is having no interval signs or symptoms of systemic infection but unfortunately the wounds have not improved dramatically since we last saw her. She does have home health coming to take care of her as well. She is currently not in any compression wraps. 02/06/16 ON evaluation today patient continues to experience discomfort regarding her bilateral lower extremity ulcers. She has continued to tolerate the dressing changes at this point in time and continues to have a Foley catheter as well. Fortunately she is back this week in the past she has been somewhat noncompliant with follow-ups I'm glad to  see her today. She tells me that her pain level varies but can be as high as a 7 out of 10 with manipulation of the wound. She tells me that she used to be on oxycodone which was managed by the pain clinic although she is no longer on that as she tells me that she was actually smoking marijuana at the time and when this was found out they discontinued her pain medication. She no longer is taking anything pain medication wise and she also does not smoke cigarettes nor marijuana at this point. 02/12/16; this is a patient I don't believe I have previously seen. She has multiple sclerosis. She has 4 punched-out areas on the anterior lateral left leg 2 areas on the right these are all in the same condition. Reasonably small [dime size] wounds each was some depth. Surface of these wounds does not look particularly healthy as there is adherent slough. There is no evidence of surrounding infection or inflammation. ABIs in this clinic were 0.87 on the right and 0.81 on the left. She is listed as having PAD and is a smoker. Not a diabetic 02/20/16 patient I gave antibiotics to last week for erythema around both wound sites on the left lateral leg and right lateral leg. This appears to be a lot better. One of the areas on the left leg is healed however she still has 3 punched-out open areas on the left lateral calf and one on the right lateral calf and one on the dorsal foot. She is an ex-smoker quitting 1 month ago. ABIs in this clinic were 0.87 on the right 0.81 on the left 03/12/16; this is a patient I really don't have a good sense of. It would appear for the first 5 or 6 weeks of her stay in this clinic she was cared for by Dr. Con Memos. She appeared on my schedule in mid October and I saw her twice. She has not been here however in 3 weeks. She has advanced Wound Care at home where she lives with her husband.  She has multiple sclerosis. I saw her the first time she had 4 punched-out small wounds on the  anterior lateral left leg it appears that she is now down to 2. She also had wounds on the lateral and medial aspect of her right leg which were also small and punched out. The only one that remains is on the right lateral. Because of the nature of her wound I went ahead and ordered formal arterial studies this showed an ABI of 1 on the right and one on the left. TBI of 1.4 on the right and 0.79 on the left. She had normal waveforms. She was in the emergency room on 02/28/16; there is a noted edema of her left leg after a fall. She had a duplex ultrasound that showed no evidence for an acute DVT from the left groin to the popliteal fossa. The study was limited in the calf veins due to edema. Also noticeable that she had a left inguinal enlarged lymph node up to 6.2 cm. She also had a left knee x-ray that showed no acute findings and a right foot x-ray that showed soft tissue swelling but no radiographic evidence of osteomyelitis. The patient does stop smoking 03-19-16 Ms Kelsh presents today for evaluation of her bilateral lower extremity ulcerations. She states that she has not smoked in several weeks. She denies any pain or discomfort to bilateral lower extremities, tolerating compression therapy as ordered. 03/26/16; I have not seen this patient in 2 weeks however she has done very well with improvements in the areas in question. After we obtain normal arterial studies, increasing the 4-layer compression really seems to look done a nice job here. She has one open area on the left lateral leg and one on the medial right leg. These have filled in nicely and are now superficial wounds that showed epithelialized. I note the left inguinal lymph node at 6.2 cm. I may need to refer this back to the patient's primary doctor. 04/03/16; patient has 2 remaining wounds 1 on the left lateral leg and one on the right medial leg. Both of filled in nicely since we are able to increase her from a 3 layer to a 4 layer  compression. I am also following up on the lymph node notable on her left leg DVT rule out in November 04/10/16; area on the right lateral leg is healed. The area on the left leg is still open but looks considerably better with healthy granulation and less wound area. I12/20/17; last week we healed the patient doubt with regards to the wound on the right leg to her on compression stocking 20-30 mmHg as it turns out I don't think she had a stocking, predictably therefore she has reopened. The left leg as well as the wound on the lateral aspect. Been generally small line 04/24/16; we healed out her right leg 2 weeks ago to her own 20-30 mm compression stockings although as it turns out she didn't have these Laura, Rosemond H. ([id number]) and did not purchase some apparently because of financial issues. The left leg wound is on the lateral aspect. Both of these wounds are just about healed. 05/01/16; her wounds on the left leg are healed out today. The area on the right leg is also healed. Vitamin diligent effort of our staff we have not been able to get any form of compression stocking to this patient. The orders for juxtalite's were given to home health this never materialized. We ordered compression stockings I don't  think she was able to afford these. We had a discussion today with both the patient and her husband without these, will accumulate edema and the wounds will simply reopen again. 05/14/16 READMISSION this is a patient we healed out 2 weeks ago. As bilateral lower extremity venous wounds probably some degree of lymphedema. At the beginning in November I did a duplex ultrasound of her left leg that was negative for DVT but did show a lymph node in the left inguinal area presumably reactive. We've been using compression to her lower extremities eventually healed all her wounds on her bilateral calves but we were not able to get any form of compression stockings for her in spite of intense  efforts of our staff. She returns today with reopening on the left leg did not the right she has several superficial areas anteriorly but an actual frank ulcer on the lateral left calf. This is about the size of a dime or smaller. She does not really complain of pain. The patient has a history of PAD however arterial studies we did in November were normal. Her ABIs and Doppler waveforms were both within the normal limits. The patient has a history of MS. In discussing things with her today it would appear that the opening was noted 2 weeks ago according to the patient. We gave her Tubigrip stockings when she left the clinic 05/21/16; she has not yet had the duplex ultrasound of her thigh, this is booked for Thursday. The wound on the left lateral lower leg is improved 05/28/16; her duplex ultrasound of the left leg specifically her left thigh did not show a DVT however did show hypoechogenic enlarged left inguinal lymph nodes up to 6.9 x 3.7 x 6.8. This also showed in the one in November however maximal diameter was 6.2, at that point I thought these were reactive secondary to cellulitis however there is obviously a differential here. Her wound on the left lower leg is closed. She has a superficial open area on the base of her left fifth metatarsal head on the plantar foot. It looks as though she has some wrap injuries on the anterior leg 06/05/16; since she was last year she had a fall on 1/31. She was seen in the ER but sent back home. I think she is also been seen by her primary physician who picked up on the swelling and the lymphadenopathy. She is ordered a CT scan of the abdomen and pelvis however this will be without contrast because of stage IIIB renal insufficiency. Nevertheless this should be a start the workup to make sure there is not is more systemic problem here. She will probably need consideration of a biopsy of the lymph node in her inguinal area, this would need general surgery. As far as  her wounds are concern today the area on the left plantar foot is healed. She has a small weeping area on the left lower leg. The wrap injury from last week has largely Pardoxically there is a lot less edema in the left thigh. 06/12/16; CT scan is on Friday. She has 2 small open areas one on the lateral left lower leg and one on the anterior lower leg on the left. Both of these looks healthy. I reduced her compression last week to Kerlix and Coban this seems to have not a reasonable job 07/02/16; the patient is now in the skilled facility. I think this was arranged out of the ER when she fell at home although I'm not  exactly sure. She is going for I think a laparotomy for the pelvic mass next week. All the wounds in the left leg have healed Readmission: 07/20/2020 upon evaluation today patient presents for reevaluation here in clinic unfortunately she is having issues with a significant stage III pressure ulcer to her left heel. This is something that occurred when she was in the hospital sick. And subsequently though she does not appear to have any new injury she is still trying to recover from the pressure injury which was quite severe during that time. I do not see any signs of infection right now which is great news. With that being said she has been require some debridement to try to clear this up as much as possible. We need to get the wound VAC cleared so that she can see overall healing and improvement that were looking for. The patient does have a history of hypertension, chronic kidney disease stage III, COPD, and multiple sclerosis. Currently she has been trying to do what she can to keep pressure off of her heel while in bed and in other locations as well. 08/04/20 upon evaluation today patient appears to be doing extremely well currently in regard to her wound on the heel. I do believe the Iodoflex has helped loosen things appear. Unfortunately she tells me that the home health nurse has not  been putting the Iodoflex on the wound. That is news to [place] and that they tell me they got supplies in the mail but they did not see the Iodoflex being used. Nonetheless I am wondering if this is just something that they missed or if it truly has not been. I did tell them to look when they get home in their box of supplies to see if they saw the dressing which showed and what it should look like. 08/11/2020 upon evaluation today patient appears to be doing decently well in regard to her wound although unfortunately this is showing signs of some blue-green drainage consistent with Pseudomonas it does have a little worried in this regard. Fortunately there is no evidence of active infection which is great news. No fevers, chills, nausea, vomiting, or diarrhea. 08/18/2020 upon evaluation today patient appears to be doing well with regard to her wound. She has been tolerating the dressing changes without complication. Fortunately there is no signs of active infection at this time. No fevers, chills, nausea, vomiting, or diarrhea. Electronic Signature(s) Signed: 08/18/2020 2:57:00 PM By: Worthy Keeler PA-C Entered By: Worthy Keeler on 08/18/2020 14:57:00 [name] -------------------------------------------------------------------------------- Physical Exam Details Patient Name: [name] Date of Service: 08/18/2020 2:00 PM Medical Record Number: [id number] Patient Account Number: [id number] Date of Birth/Sex: [birth year] (69 y.o. F) Treating RN: Carlene Coria Primary Care Provider: [name] Other Clinician: Referring Provider: [name] Treating Provider/Extender: [name] in Treatment: 4 Constitutional Well-nourished and well-hydrated in no acute distress. Respiratory normal breathing without difficulty. Psychiatric this patient is able to make decisions and demonstrates good insight into disease process. Alert and Oriented x 3. pleasant and  cooperative. Notes Patient's wound bed showed signs of good granulation epithelization at this point. I do think that there is some slough noted as well as some callus I am then removed that today she tolerated that without any significant pain which is great news. Post debridement this looks better though there is still a little bit of work to do likely next week as well. Electronic Signature(s) Signed: 08/18/2020 2:57:18 PM By: Joaquim Lai  III, Tene Gato PA-C Entered By: Worthy Keeler on 08/18/2020 14:57:18 Mcbreen, [name] ([id number]) -------------------------------------------------------------------------------- Physician Orders Details Patient Name: [name] Date of Service: 08/18/2020 2:00 PM Medical Record Number: [id number] Patient Account Number: [id number] Date of Birth/Sex: Sep 24, 1950 (69 y.o. F) Treating RN: Carlene Coria Primary Care Provider: [name] Other Clinician: Referring Provider: [name] Treating Provider/Extender: [name] in Treatment: 4 Verbal / Phone Orders: No Diagnosis Coding ICD-10 Coding Code Description [phone] Pressure ulcer of left heel, stage 3 I10 Essential (primary) hypertension N18.30 Chronic kidney disease, stage 3 unspecified J44.9 Chronic obstructive pulmonary disease, unspecified G35 Multiple sclerosis Follow-up Appointments o Return Appointment in 1 week. [place]: - [facility] [place] for wound care. May utilize formulary equivalent dressing for wound treatment orders unless otherwise specified. Home Health Nurse may visit PRN to address patientos wound care needs. o Scheduled days for dressing changes to be completed; exception, patient has scheduled wound care visit that day. - Mondays and Thursdays o **Please direct any NON-WOUND related issues/requests for orders to patient's Primary Care Physician. **If current dressing causes regression in wound condition, may  D/C ordered dressing product/s and apply Normal Saline Moist Dressing daily until next [place] or Other MD appointment. **Notify Wound Healing Center of regression in wound condition at [phone]. Edema Control - Lymphedema / Segmental Compressive Device / Other o Optional: One layer of unna paste to top of compression wrap (to act as an anchor). o Elevate, Exercise Daily and Avoid Standing for Long Periods of Time. o Elevate legs to the level of the heart and pump ankles as often as possible o Elevate leg(s) parallel to the floor when sitting. Off-Loading o Open toe surgical shoe o Other: - Keep pressure off left heel Wound Treatment Wound #12 - Calcaneus Wound Laterality: Left Cleanser: Normal Saline 2 x Per Week/30 Days Discharge Instructions: Wash your hands with soap and water. Remove old dressing, discard into plastic bag and place into trash. Cleanse the wound with Normal Saline prior to applying a clean dressing using gauze sponges, not tissues or cotton balls. Do not scrub or use excessive force. Pat dry using gauze sponges, not tissue or cotton balls. Primary Dressing: Hydrofera Blue Ready Transfer Foam, 4x5 (in/in) (Generic) 2 x Per Week/30 Days Discharge Instructions: Apply Hydrofera Blue Ready to wound bed as directed Secondary Dressing: ABD Pad 5x9 (in/in) 2 x Per Week/30 Days Discharge Instructions: Cover with ABD pad Compression Wrap: Profore Lite LF 3 Multilayer Compression Bandaging System 2 x Per Week/30 Days Discharge Instructions: Apply 3 multi-layer wrap as prescribed. Electronic Signature(s) Signed: 08/18/2020 5:11:44 PM By: Worthy Keeler PA-C Signed: 08/23/2020 3:51:10 PM By: Carlene Coria RN Entered By: Carlene Coria on 08/18/2020 14:52:07 Whitefield, [name] ([id number]) ROSINE, LANGI ([id number]) -------------------------------------------------------------------------------- Problem List Details Patient Name: [name] Date of  Service: 08/18/2020 2:00 PM Medical Record Number: [id number] Patient Account Number: [id number] Date of Birth/Sex: Dec 09, 1950 (69 y.o. F) Treating RN: Carlene Coria Primary Care Provider: [name] Other Clinician: Referring Provider: [name] Treating Provider/Extender: [name] in Treatment: 4 Active Problems ICD-10 Encounter Code Description Active Date MDM Diagnosis [phone] Pressure ulcer of left heel, stage 3 07/20/2020 No Yes I10 Essential (primary) hypertension 07/20/2020 No Yes N18.30 Chronic kidney disease, stage 3 unspecified 07/20/2020 No Yes J44.9 Chronic obstructive pulmonary disease, unspecified 07/20/2020 No Yes G35 Multiple sclerosis 07/20/2020 No Yes Inactive Problems Resolved Problems Electronic Signature(s) Signed: 08/18/2020 2:40:14  PM By: Worthy Keeler PA-C Entered By: Worthy Keeler on 08/18/2020 14:40:13 [name] -------------------------------------------------------------------------------- Progress Note Details Patient Name: [name] Date of Service: 08/18/2020 2:00 PM Medical Record Number: [id number] Patient Account Number: [id number] Date of Birth/Sex: August 18, 1950 (69 y.o. F) Treating RN: Carlene Coria Primary Care Provider: [name] Other Clinician: Referring Provider: [name] Treating Provider/Extender: [name] in Treatment: 4 Subjective Chief Complaint Information obtained from Patient Left Heel Pressure Ulcer History of Present Illness (HPI) The following HPI elements were documented for the patient's wound: Location: left and right lower extremity and dorsum of the right foot Quality: Patient reports experiencing a sharp pain to affected area(s). Severity: Patient rates the pain to be a 8 out of 10 especially with palpation Duration: Patient has had the wound for > 6 months prior to seeking treatment at the wound center Timing: Pain in wound is Intermittent in  severity but persistent Context: The wound appeared gradually over time Modifying Factors: Other treatment(s) tried include: symptomatic treatment as advised by her PCP Associated Signs and Symptoms: Patient reports having increase swelling iin her bilateral lower extremities 70 year old with a past medical history significant for MS, urinary incontinence, and obesity. She has been seen in the wound clinic before for lower extremity ulcerations treated with compression therapy. she is also known to have hypertension, peripheral vascular disease, COPD, obstructive sleep apnea, lumbar radiculopathy, kyphoscoliosis, urinary issues and tobacco abuse. Smokes a packet of cigarettes a day was recently seen at the [facility] for swelling of her legs and feet with a ulceration on the dorsum of the right foot which has been there for about 6 months. she was recently in the ER about a month ago where she was seen for shortness of breath and swelling of the legs and a chest x-ray was within normal limits had an increase in her BNP and was given Lasix and put on doxycycline for a mild cellulitis and possible UTI. Wounds aresmaller today 11/13/15. Debrided and will continue Santyl. 12/21/2015 -- she was admitted to the hospital overnight on 12/18/2015 and diagnosed with urinary retention and cellulitis of the left lower leg. is past to take clindamycin and use Santyl for her wounds. 01/15/2016 -- come back to see [place] for almost a month and continues to be noncompliant with her dressings 01/30/16 patient presents today for a follow-up visit concerning her ongoing bilateral lower extremity wounds. We have not seen her for the past 2 weeks although we are supposed to be seen her for weekly visits. She currently has a Foley catheter. She tells me that the wounds are intensely painful especially with pressure and palpation at this point in time. Fortunately she is having no interval signs or symptoms of  systemic infection but unfortunately the wounds have not improved dramatically since we last saw her. She does have home health coming to take care of her as well. She is currently not in any compression wraps. 02/06/16 ON evaluation today patient continues to experience discomfort regarding her bilateral lower extremity ulcers. She has continued to tolerate the dressing changes at this point in time and continues to have a Foley catheter as well. Fortunately she is back this week in the past she has been somewhat noncompliant with follow-ups I'm glad to see her today. She tells me that her pain level varies but can be as high as a 7 out of 10 with manipulation of the wound. She tells me that she used  to be on oxycodone which was managed by the pain clinic although she is no longer on that as she tells me that she was actually smoking marijuana at the time and when this was found out they discontinued her pain medication. She no longer is taking anything pain medication wise and she also does not smoke cigarettes nor marijuana at this point. 02/12/16; this is a patient I don't believe I have previously seen. She has multiple sclerosis. She has 4 punched-out areas on the anterior lateral left leg 2 areas on the right these are all in the same condition. Reasonably small [dime size] wounds each was some depth. Surface of these wounds does not look particularly healthy as there is adherent slough. There is no evidence of surrounding infection or inflammation. ABIs in this clinic were 0.87 on the right and 0.81 on the left. She is listed as having PAD and is a smoker. Not a diabetic 02/20/16 patient I gave antibiotics to last week for erythema around both wound sites on the left lateral leg and right lateral leg. This appears to be a lot better. One of the areas on the left leg is healed however she still has 3 punched-out open areas on the left lateral calf and one on the right lateral calf and one on  the dorsal foot. She is an ex-smoker quitting 1 month ago. ABIs in this clinic were 0.87 on the right 0.81 on the left 03/12/16; this is a patient I really don't have a good sense of. It would appear for the first 5 or 6 weeks of her stay in this clinic she was cared for by Dr. Con Memos. She appeared on my schedule in mid October and I saw her twice. She has not been here however in 3 weeks. She has advanced Wound Care at home where she lives with her husband. She has multiple sclerosis. I saw her the first time she had 4 punched-out small wounds on the anterior lateral left leg it appears that she is now down to 2. She also had wounds on the lateral and medial aspect of her right leg which were also small and punched out. The only one that remains is on the right lateral. Because of the nature of her wound I went ahead and ordered formal arterial studies this showed an ABI of 1 on the right and one on the left. TBI of 1.4 on the right and 0.79 on the left. She had normal waveforms. She was in the emergency room on 02/28/16; there is a noted edema of her left leg after a fall. She had a duplex ultrasound that showed no evidence for an acute DVT from the left groin to the popliteal fossa. The study was limited in the calf veins due to edema. Also noticeable that she had a left inguinal enlarged lymph node up to 6.2 cm. She also had a left knee x-ray that showed no acute findings and a right foot x-ray that showed soft tissue swelling but no radiographic evidence of osteomyelitis. The patient does stop smoking 03-19-16 Ms Cude presents today for evaluation of her bilateral lower extremity ulcerations. She states that she has not smoked in several weeks. She denies any pain or discomfort to bilateral lower extremities, tolerating compression therapy as ordered. 03/26/16; I have not seen this patient in 2 weeks however she has done very well with improvements in the areas in question. After we obtain normal  arterial studies, increasing the 4-layer compression really seems  to look done a nice job here. She has one open area on the left lateral leg and one on the medial right leg. These have filled in nicely and are now superficial wounds that showed epithelialized. I note the left inguinal lymph node at 6.2 cm. I may need to refer this back to the patient's primary doctor. 04/03/16; patient has 2 remaining wounds 1 on the left lateral leg and one on the right medial leg. Both of filled in nicely since we are able to increase her from a 3 layer to a 4 layer compression. I am also following up on the lymph node notable on her left leg DVT rule out in November MILEYDI, DEGER. ([id number]) 04/10/16; area on the right lateral leg is healed. The area on the left leg is still open but looks considerably better with healthy granulation and less wound area. I12/20/17; last week we healed the patient doubt with regards to the wound on the right leg to her on compression stocking 20-30 mmHg as it turns out I don't think she had a stocking, predictably therefore she has reopened. The left leg as well as the wound on the lateral aspect. Been generally small line 04/24/16; we healed out her right leg 2 weeks ago to her own 20-30 mm compression stockings although as it turns out she didn't have these and did not purchase some apparently because of financial issues. The left leg wound is on the lateral aspect. Both of these wounds are just about healed. 05/01/16; her wounds on the left leg are healed out today. The area on the right leg is also healed. Vitamin diligent effort of our staff we have not been able to get any form of compression stocking to this patient. The orders for juxtalite's were given to home health this never materialized. We ordered compression stockings I don't think she was able to afford these. We had a discussion today with both the patient and her husband without these, will accumulate edema and  the wounds will simply reopen again. 05/14/16 READMISSION this is a patient we healed out 2 weeks ago. As bilateral lower extremity venous wounds probably some degree of lymphedema. At the beginning in November I did a duplex ultrasound of her left leg that was negative for DVT but did show a lymph node in the left inguinal area presumably reactive. We've been using compression to her lower extremities eventually healed all her wounds on her bilateral calves but we were not able to get any form of compression stockings for her in spite of intense efforts of our staff. She returns today with reopening on the left leg did not the right she has several superficial areas anteriorly but an actual frank ulcer on the lateral left calf. This is about the size of a dime or smaller. She does not really complain of pain. The patient has a history of PAD however arterial studies we did in November were normal. Her ABIs and Doppler waveforms were both within the normal limits. The patient has a history of MS. In discussing things with her today it would appear that the opening was noted 2 weeks ago according to the patient. We gave her Tubigrip stockings when she left the clinic 05/21/16; she has not yet had the duplex ultrasound of her thigh, this is booked for Thursday. The wound on the left lateral lower leg is improved 05/28/16; her duplex ultrasound of the left leg specifically her left thigh did not show a DVT  however did show hypoechogenic enlarged left inguinal lymph nodes up to 6.9 x 3.7 x 6.8. This also showed in the one in November however maximal diameter was 6.2, at that point I thought these were reactive secondary to cellulitis however there is obviously a differential here. Her wound on the left lower leg is closed. She has a superficial open area on the base of her left fifth metatarsal head on the plantar foot. It looks as though she has some wrap injuries on the anterior leg 06/05/16; since she was  last year she had a fall on 1/31. She was seen in the ER but sent back home. I think she is also been seen by her primary physician who picked up on the swelling and the lymphadenopathy. She is ordered a CT scan of the abdomen and pelvis however this will be without contrast because of stage IIIB renal insufficiency. Nevertheless this should be a start the workup to make sure there is not is more systemic problem here. She will probably need consideration of a biopsy of the lymph node in her inguinal area, this would need general surgery. As far as her wounds are concern today the area on the left plantar foot is healed. She has a small weeping area on the left lower leg. The wrap injury from last week has largely Pardoxically there is a lot less edema in the left thigh. 06/12/16; CT scan is on Friday. She has 2 small open areas one on the lateral left lower leg and one on the anterior lower leg on the left. Both of these looks healthy. I reduced her compression last week to Kerlix and Coban this seems to have not a reasonable job 07/02/16; the patient is now in the skilled facility. I think this was arranged out of the ER when she fell at home although I'm not exactly sure. She is going for I think a laparotomy for the pelvic mass next week. All the wounds in the left leg have healed Readmission: 07/20/2020 upon evaluation today patient presents for reevaluation here in clinic unfortunately she is having issues with a significant stage III pressure ulcer to her left heel. This is something that occurred when she was in the hospital sick. And subsequently though she does not appear to have any new injury she is still trying to recover from the pressure injury which was quite severe during that time. I do not see any signs of infection right now which is great news. With that being said she has been require some debridement to try to clear this up as much as possible. We need to get the wound VAC cleared  so that she can see overall healing and improvement that were looking for. The patient does have a history of hypertension, chronic kidney disease stage III, COPD, and multiple sclerosis. Currently she has been trying to do what she can to keep pressure off of her heel while in bed and in other locations as well. 08/04/20 upon evaluation today patient appears to be doing extremely well currently in regard to her wound on the heel. I do believe the Iodoflex has helped loosen things appear. Unfortunately she tells me that the home health nurse has not been putting the Iodoflex on the wound. That is news to [place] and that they tell me they got supplies in the mail but they did not see the Iodoflex being used. Nonetheless I am wondering if this is just something that they missed or if it  truly has not been. I did tell them to look when they get home in their box of supplies to see if they saw the dressing which showed and what it should look like. 08/11/2020 upon evaluation today patient appears to be doing decently well in regard to her wound although unfortunately this is showing signs of some blue-green drainage consistent with Pseudomonas it does have a little worried in this regard. Fortunately there is no evidence of active infection which is great news. No fevers, chills, nausea, vomiting, or diarrhea. 08/18/2020 upon evaluation today patient appears to be doing well with regard to her wound. She has been tolerating the dressing changes without complication. Fortunately there is no signs of active infection at this time. No fevers, chills, nausea, vomiting, or diarrhea. Objective Constitutional Well-nourished and well-hydrated in no acute distress. Vitals Time Taken: 2:27 PM, Height: 64 in, Weight: 265 lbs, BMI: 45.5, Temperature: 98.2 F, Pulse: 82 bpm, Respiratory Rate: 18 breaths/min, Blood Pressure: 120/72 mmHg. Ehrman, Chasmine H. ([id number]) Respiratory normal breathing without  difficulty. Psychiatric this patient is able to make decisions and demonstrates good insight into disease process. Alert and Oriented x 3. pleasant and cooperative. General Notes: Patient's wound bed showed signs of good granulation epithelization at this point. I do think that there is some slough noted as well as some callus I am then removed that today she tolerated that without any significant pain which is great news. Post debridement this looks better though there is still a little bit of work to do likely next week as well. Integumentary (Hair, Skin) Wound #12 status is Open. Original cause of wound was Pressure Injury. The date acquired was: 06/27/2020. The wound has been in treatment 4 weeks. The wound is located on the Left Calcaneus. The wound measures 2.3cm length x 2.5cm width x 0.2cm depth; 4.516cm^2 area and 0.903cm^3 volume. There is Fat Layer (Subcutaneous Tissue) exposed. There is no tunneling or undermining noted. There is a medium amount of serosanguineous drainage noted. The wound margin is thickened. There is medium (34-66%) pink granulation within the wound bed. There is a medium (34-66%) amount of necrotic tissue within the wound bed including Adherent Slough. Assessment Active Problems ICD-10 Pressure ulcer of left heel, stage 3 Essential (primary) hypertension Chronic kidney disease, stage 3 unspecified Chronic obstructive pulmonary disease, unspecified Multiple sclerosis Procedures Wound #12 Pre-procedure diagnosis of Wound #12 is a Pressure Ulcer located on the Left Calcaneus . There was a Excisional Skin/Subcutaneous Tissue Debridement with a total area of 5.75 sq cm performed by Tommie Sams., PA-C. With the following instrument(s): Curette to remove Viable and Non-Viable tissue/material. Material removed includes Callus, Subcutaneous Tissue, Slough, Skin: Dermis, and Skin: Epidermis after achieving pain control using Lidocaine 4% Topical Solution. No specimens  were taken. A time out was conducted at 14:50, prior to the start of the procedure. A Moderate amount of bleeding was controlled with Pressure. The procedure was tolerated well with a pain level of 0 throughout and a pain level of 0 following the procedure. Post Debridement Measurements: 2.3cm length x 2.5cm width x 0.2cm depth; 0.903cm^3 volume. Post debridement Stage noted as Category/Stage III. Character of Wound/Ulcer Post Debridement is improved. Post procedure Diagnosis Wound #12: Same as Pre-Procedure Plan Follow-up Appointments: Return Appointment in 1 week. Home Health: [facility] for wound care. May utilize formulary equivalent dressing for wound treatment orders unless otherwise specified. Home Health Nurse may visit PRN to address patient s  wound care needs. Scheduled days for dressing changes to be completed; exception, patient has scheduled wound care visit that day. - Mondays and Thursdays **Please direct any NON-WOUND related issues/requests for orders to patient's Primary Care Physician. **If current dressing causes regression in wound condition, may D/C ordered dressing product/s and apply Normal Saline Moist Dressing daily until next [facility] [place] or Other MD appointment. **Notify [facility] of regression in wound condition at [phone]. Edema Control - Lymphedema / Segmental Compressive Device / Other: Optional: One layer of unna paste to top of compression wrap (to act as an anchor). Elevate, Exercise Daily and Avoid Standing for Long Periods of Time. Elevate legs to the level of the heart and pump ankles as often as possible Elevate leg(s) parallel to the floor when sitting. Off-Loading: Open toe surgical shoe Other: - Keep pressure off left heel Segoviano, Kylea H. ([id number]) WOUND #12: - Calcaneus Wound Laterality: Left Cleanser: Normal Saline 2 x Per Week/30 Days Discharge Instructions: Wash your hands  with soap and water. Remove old dressing, discard into plastic bag and place into trash. Cleanse the wound with Normal Saline prior to applying a clean dressing using gauze sponges, not tissues or cotton balls. Do not scrub or use excessive force. Pat dry using gauze sponges, not tissue or cotton balls. Primary Dressing: Hydrofera Blue Ready Transfer Foam, 4x5 (in/in) (Generic) 2 x Per Week/30 Days Discharge Instructions: Apply Hydrofera Blue Ready to wound bed as directed Secondary Dressing: ABD Pad 5x9 (in/in) 2 x Per Week/30 Days Discharge Instructions: Cover with ABD pad Compression Wrap: Profore Lite LF 3 Multilayer Compression Bandaging System 2 x Per Week/30 Days Discharge Instructions: Apply 3 multi-layer wrap as prescribed. 1. Would recommend currently that we actually go ahead and switch over to [facility] I think this is a good option for the patient this is what home health put on and I did not actually advise them of that that I am aware of. I do not see any messages in the chart. Nonetheless as opposed to the Iodoflex I think the [facility] is still a good option and we will go ahead and work on with that at this point. 2. I am also can recommend the patient continue with appropriate offloading I think this is of utmost importance. 3. We use an ABD pad to cover and then a 3 layer compression wrap. We will see patient back for reevaluation in 1 week here in the clinic. If anything worsens or changes patient will contact our office for additional recommendations. Electronic Signature(s) Signed: 08/18/2020 2:57:51 PM By: Worthy Keeler PA-C Entered By: Worthy Keeler on 08/18/2020 14:57:51 Cress, [name] ([id number]) -------------------------------------------------------------------------------- SuperBill Details Patient Name: [name] Date of Service: 08/18/2020 Medical Record Number: [id number] Patient Account Number: [id number] Date of Birth/Sex: March 03, 1951 (69  y.o. F) Treating RN: Carlene Coria Primary Care Provider: [name] Other Clinician: Referring Provider: [name] Treating Provider/Extender: [name] in Treatment: 4 Diagnosis Coding ICD-10 Codes Code Description [phone] Pressure ulcer of left heel, stage 3 I10 Essential (primary) hypertension N18.30 Chronic kidney disease, stage 3 unspecified J44.9 Chronic obstructive pulmonary disease, unspecified G35 Multiple sclerosis Facility Procedures CPT4 Code: [id number] Description: 11042 - DEB SUBQ TISSUE 20 SQ CM/< Modifier: Quantity: 1 CPT4 Code: Description: ICD-10 Diagnosis Description L89.623 Pressure ulcer of left heel, stage 3 Modifier: Quantity: Physician Procedures CPT4 [id number] Description: 11042 - WC PHYS SUBQ TISS 20 SQ CM Modifier: Quantity: 1 CPT4 Code: Description: ICD-10  Diagnosis Description L89.623 Pressure ulcer of left heel, stage 3 Modifier: Quantity: Electronic Signature(s) Signed: 08/18/2020 2:58:00 PM By: Worthy Keeler PA-C Entered By: Worthy Keeler on 08/18/2020 14:58:00

## 2020-08-23 NOTE — Progress Notes (Signed)
[name], [name] ([id number]) Visit Report for 08/18/2020 Arrival Information Details Patient Name: [name] Date of Service: 08/18/2020 2:00 PM Medical Record Number: [id number] Patient Account Number: [id number] Date of Birth/Sex: [birth year] (69 y.o. F) Treating [name]: [name] Primary Care [name]: [name] Other Clinician: Referring [name]: [name] Treating Bell Carbo/Extender: [name]: 4 Visit Information History Since Last Visit Added or deleted any medications: No Patient Arrived: Wheel Chair Any new allergies or adverse reactions: No Arrival Time: 14:24 Had a fall or experienced change in No Accompanied By: husband activities of daily living that may affect Transfer Assistance: Manual risk of falls: Patient Identification Verified: Yes Hospitalized since last visit: No Secondary Verification Process Completed: Yes Has Dressing in Place as Prescribed: Yes Pain Present Now: Yes Electronic Signature(s) Signed: 08/18/2020 5:00:25 PM By: [name], [name] Entered By: [name] on 08/18/2020 14:24:50 [name] ([id number]) -------------------------------------------------------------------------------- Clinic Level of Care Assessment Details Patient Name: [name] Date of Service: 08/18/2020 2:00 PM Medical Record Number: [id number] Patient Account Number: [id number] Date of Birth/Sex: 03/07/1951 (69 y.o. F) Treating [name]: [name] Primary Care Nevayah Faust: [name] Other Clinician: Referring [name]: [name] Treating [name]/Extender: [name]: 4 Clinic Level of Care Assessment Items TOOL 1 Quantity Score _0  - Use when EandM and Procedure is performed on INITIAL visit 0 ASSESSMENTS - Nursing Assessment / Reassessment _1  - General Physical Exam (combine w/ comprehensive assessment (listed just below) when performed on new 0 pt. evals) _2  -  0 Comprehensive Assessment (HX, ROS, Risk Assessments, Wounds Hx, etc.) ASSESSMENTS - Wound and Skin Assessment / Reassessment _3  - Dermatologic / Skin Assessment (not related to wound area) 0 ASSESSMENTS - Ostomy and/or Continence Assessment and Care _4  - Incontinence Assessment and Management 0 _5  - 0 Ostomy Care Assessment and Management (repouching, etc.) PROCESS - Coordination of Care _6  - Simple Patient / Family Education for ongoing care 0 _7  - 0 Complex (extensive) Patient / Family Education for ongoing care _8  - 0 Staff obtains Programmer, systems, Records, Test Results / Process Orders _9  - 0 Staff telephones HHA, Nursing Homes / Clarify orders / etc _10  - 0 Routine Transfer to another Facility (non-emergent condition) _11  - 0 Routine Hospital Admission (non-emergent condition) _12  - 0 New Admissions / Biomedical engineer / Ordering NPWT, Apligraf, etc. _13  - 0 Emergency Hospital Admission (emergent condition) PROCESS - Special Needs _14  - Pediatric / Minor Patient Management 0 _15  - 0 Isolation Patient Management _16  - 0 Hearing / Language / Visual special needs _17  - 0 Assessment of Community assistance (transportation, D/C planning, etc.) _18  - 0 Additional assistance / Altered mentation _19  - 0 Support Surface(s) Assessment (bed, cushion, seat, etc.) INTERVENTIONS - Miscellaneous _20  - External ear exam 0 _21  - 0 Patient Transfer (multiple staff / Civil Service fast streamer / Similar devices) _22  - 0 Simple Staple / Suture removal (25 or less) _23  - 0 Complex Staple / Suture removal (26 or more) _24  - 0 Hypo/Hyperglycemic Management (do not check if billed separately) _25  - 0 Ankle / Brachial Index (ABI) - do not check if billed separately Has the patient been seen at the hospital within the last three years: Yes Total Score: 0 Level Of Care: ____ [name] ([id number]) Electronic Signature(s) Signed: 08/23/2020 3:51:10 PM By: [name] Coria [name] Entered By: [name] on 08/18/2020  14:54:05 [name] ([id number]) -------------------------------------------------------------------------------- Encounter Discharge Information Details Patient Name: [name] Date of Service: 08/18/2020  2:00 PM Medical Record Number: [id number] Patient Account Number: [id number] Date of Birth/Sex: 08-21-1950 (69 y.o. F) Treating [name]: [name] Primary Care [name]: [name] Other Clinician: Referring [name]: [name] Treating [name]/Extender: [name]: 4 Encounter Discharge Information Items Post Procedure Vitals Discharge Condition: Stable Temperature (F): 97.8 Ambulatory Status: Wheelchair Pulse (bpm): 82 Discharge Destination: Home Respiratory Rate (breaths/min): 18 Transportation: Private Auto Blood Pressure (mmHg): 120/72 Accompanied By: husband Schedule Follow-up Appointment: Yes Clinical Summary of Care: Electronic Signature(s) Signed: 08/18/2020 4:01:56 PM By: [name] Entered By: [name] on 08/18/2020 15:10:36 [name] ([id number]) -------------------------------------------------------------------------------- Lower Extremity Assessment Details Patient Name: [name] Date of Service: 08/18/2020 2:00 PM Medical Record Number: [id number] Patient Account Number: [id number] Date of Birth/Sex: 21-Oct-1950 (69 y.o. F) Treating [name]: [name] Primary Care Nicolai Labonte: [name] Other Clinician: Referring Neelie Welshans: [name] Treating Joylene Wescott/Extender: [name] Weeks in [name]: 4 Edema Assessment Assessed: [Left: Yes] [Right: No] Edema: [Left: Ye] [Right: s] Calf Left: Right: Point of Measurement: 36 cm From Medial Instep 35.5 cm Ankle Left: Right: Point of Measurement: 10 cm From Medial Instep 21 cm Vascular Assessment Pulses: Dorsalis Pedis Palpable: [Left:Yes] Electronic Signature(s) Signed: 08/18/2020 5:00:25 PM By: [name], [name] Entered By: [name] on 08/18/2020 14:40:53 Lutz, [name] ([id number]) -------------------------------------------------------------------------------- Multi Wound Chart Details Patient Name: [name] Date of Service: 08/18/2020 2:00 PM Medical Record Number: [id number] Patient Account Number: [id number] Date of Birth/Sex: 01-07-1951 (69 y.o. F) Treating [name]: [name] Primary Care Erendira Crabtree: [name] Other Clinician: Referring Tierany Appleby: [name] Treating Joyanna Kleman/Extender: [name]: 4 Vital Signs Height(in): 69 Pulse(bpm): 54 Weight(lbs): 265 Blood Pressure(mmHg): 120/72 Body Mass Index(BMI): 45 Temperature(F): 98.2 Respiratory Rate(breaths/min): 18 Photos: [N/A:N/A] Wound Location: Left Calcaneus N/A N/A Wounding Event: Pressure Injury N/A N/A Primary Etiology: Pressure Ulcer N/A N/A Comorbid History: Cataracts, Chronic Obstructive N/A N/A Pulmonary Disease (COPD), Sleep Apnea, Hypertension, Osteoarthritis, Neuropathy Date Acquired: 06/27/2020 N/A N/A Weeks of [name]: 4 N/A N/A Wound Status: Open N/A N/A Measurements L x W x D (cm) 2.3x2.5x0.2 N/A N/A Area (cm) : 4.516 N/A N/A Volume (cm) : 0.903 N/A N/A % Reduction in Area: 11.50% N/A N/A % Reduction in Volume: 11.60% N/A N/A Classification: Category/Stage III N/A N/A Exudate Amount: Medium N/A N/A Exudate Type: Serosanguineous N/A N/A Exudate Color: red, brown N/A N/A Wound Margin: Thickened N/A N/A Granulation Amount: Medium (34-66%) N/A N/A Granulation Quality: Pink N/A N/A Necrotic Amount: Medium (34-66%) N/A N/A Exposed Structures: Fat Layer (Subcutaneous Tissue): N/A N/A Yes Fascia: No Tendon: No Muscle: No Joint: No Bone: No Epithelialization: None N/A N/A [name] Notes Electronic Signature(s) Signed: 08/23/2020 3:51:10 PM By: [name] Coria [name] Entered By: [name] on 08/18/2020 14:50:32 Raider, [name]  ([id number]) -------------------------------------------------------------------------------- [place] Details Patient Name: [name] Date of Service: 08/18/2020 2:00 PM Medical Record Number: [id number] Patient Account Number: [id number] Date of Birth/Sex: 03-06-1951 (69 y.o. F) Treating [name]: [name] Primary Care [name] Hink: [name] Other Clinician: Referring Sari Cogan: [name] Treating Jamyia Fortune/Extender: [name]: 4 Active Inactive Pressure Nursing Diagnoses: Knowledge deficit related to causes and risk factors for pressure ulcer development Knowledge deficit related to management of pressures ulcers Goals: Patient will remain free from development of additional pressure ulcers Date Initiated: 07/20/2020 Target Resolution Date: 08/20/2020 Goal Status: Active Patient will remain free of pressure ulcers Date Initiated: 07/20/2020 Target Resolution Date: 08/20/2020 Goal Status: Active Patient/caregiver will verbalize risk factors  for pressure ulcer development Date Initiated: 07/20/2020 Date Inactivated: 08/04/2020 Target Resolution Date: 07/20/2020 Goal Status: Met Patient/caregiver will verbalize understanding of pressure ulcer management Date Initiated: 07/20/2020 Date Inactivated: 08/04/2020 Target Resolution Date: 07/20/2020 Goal Status: Met Interventions: Assess: immobility, friction, shearing, incontinence upon admission and as needed Assess offloading mechanisms upon admission and as needed Assess potential for pressure ulcer upon admission and as needed Notes: Wound/Skin Impairment Nursing Diagnoses: Impaired tissue integrity Goals: Patient/caregiver will verbalize understanding of skin care regimen Date Initiated: 07/20/2020 Target Resolution Date: 08/20/2020 Goal Status: Active Ulcer/skin breakdown will have a volume reduction of 30% by week 4 Date Initiated: 07/20/2020 Target Resolution Date:  08/20/2020 Goal Status: Active Ulcer/skin breakdown will have a volume reduction of 50% by week 8 Date Initiated: 07/20/2020 Target Resolution Date: 09/19/2020 Goal Status: Active Ulcer/skin breakdown will have a volume reduction of 80% by week 12 Date Initiated: 07/20/2020 Target Resolution Date: 10/20/2020 Goal Status: Active Ulcer/skin breakdown will heal within 14 weeks Date Initiated: 07/20/2020 Target Resolution Date: 11/19/2020 Goal Status: Active Interventions: Assess patient/caregiver ability to obtain necessary supplies Assess patient/caregiver ability to perform ulcer/skin care regimen upon admission and as needed Provide education on ulcer and skin care ARDELLE, HAL[place]URTON ([id number]) [name] Activities: Referred to DME Arnulfo Batson for dressing supplies : 07/20/2020 Skin care regimen initiated : 07/20/2020 Notes: Electronic Signature(s) Signed: 08/23/2020 3:51:10 PM By: [name] Coria [name] Entered By: [name] on 08/18/2020 14:50:06 [name] ([id number]) -------------------------------------------------------------------------------- Pain Assessment Details Patient Name: [name] Date of Service: 08/18/2020 2:00 PM Medical Record Number: [id number] Patient Account Number: [id number] Date of Birth/Sex: Apr 12, 1951 (69 y.o. F) Treating [name]: [name] Primary Care Stephano Arrants: [name] Other Clinician: Referring Cletus Paris: [name] Treating Shalimar Mcclain/Extender: [name]: 4 Active Problems Location of Pain Severity and Description of Pain Patient Has Paino Yes Site Locations Pain Location: Pain in Ulcers Rate the pain. Current Pain Level: 2 Pain Management and Medication Current Pain Management: Electronic Signature(s) Signed: 08/18/2020 5:00:25 PM By: [name], [name] Entered By: [name] on 08/18/2020 14:27:30 [name]  ([id number]) -------------------------------------------------------------------------------- Patient/Caregiver Education Details Patient Name: [name] Date of Service: 08/18/2020 2:00 PM Medical Record Number: [id number] Patient Account Number: [id number] Date of Birth/Gender: [birth year] (69 y.o. F) Treating [name]: [name] Primary Care Physician: [name] Other Clinician: Referring Physician: Frazier [name] Treating Physician/Extender: [name]: 4 Education Assessment Education Provided To: Patient Education Topics Provided Wound/Skin Impairment: Methods: Explain/Verbal Responses: State content correctly Electronic Signature(s) Signed: 08/23/2020 3:51:10 PM By: [name] Coria [name] Entered By: [name] on 08/18/2020 14:54:19 Slatter, [name] ([id number]) -------------------------------------------------------------------------------- Wound Assessment Details Patient Name: [name] Date of Service: 08/18/2020 2:00 PM Medical Record Number: [id number] Patient Account Number: [id number] Date of Birth/Sex: 10/21/1950 (69 y.o. F) Treating [name]: [name] Primary Care Joelys Staubs: [name] Other Clinician: Referring [name] Kirkeby: [name] Treating Eber Ferrufino/Extender: [name]: 4 Wound Status Wound Number: 12 Primary Pressure Ulcer Etiology: Wound Location: Left Calcaneus Wound Open Wounding Event: Pressure Injury Status: Date Acquired: 06/27/2020 Comorbid Cataracts, Chronic Obstructive Pulmonary Disease Weeks Of [name]: 4 History: (COPD), Sleep Apnea, Hypertension, Osteoarthritis, Clustered Wound: No Neuropathy Photos Wound Measurements Length: (cm) 2.3 Width: (cm) 2.5 Depth: (cm) 0.2 Area: (cm) 4.516 Volume: (cm) 0.903 % Reduction in Area: 11.5% % Reduction in Volume: 11.6% Epithelialization: None Tunneling: No Undermining: No Wound Description Classification: Category/Stage  III Wound Margin: Thickened Exudate Amount: Medium Exudate Type: Serosanguineous Exudate Color: red,  brown Foul Odor After Cleansing: No Slough/Fibrino Yes Wound Bed Granulation Amount: Medium (34-66%) Exposed Structure Granulation Quality: Pink Fascia Exposed: No Necrotic Amount: Medium (34-66%) Fat Layer (Subcutaneous Tissue) Exposed: Yes Necrotic Quality: Adherent Slough Tendon Exposed: No Muscle Exposed: No Joint Exposed: No Bone Exposed: No [name] Notes Wound #12 (Calcaneus) Wound Laterality: Left Cleanser Normal Saline Discharge Instruction: Wash your hands with soap and water. Remove old dressing, discard into plastic bag and place into trash. Cleanse the wound with Normal Saline prior to applying a clean dressing using gauze sponges, not tissues or cotton balls. Do not Gero, Lakeyn H. ([id number]) scrub or use excessive force. Pat dry using gauze sponges, not tissue or cotton balls. Peri-Wound Care Topical Primary Dressing Hydrofera Blue Ready Transfer Foam, 4x5 (in/in) Discharge Instruction: Apply Hydrofera Blue Ready to wound bed as directed Secondary Dressing ABD Pad 5x9 (in/in) Discharge Instruction: Cover with ABD pad Secured With Compression Wrap Profore Lite LF 3 Multilayer Compression [place] Discharge Instruction: Apply 3 multi-layer wrap as prescribed. Compression Stockings Add-Ons Electronic Signature(s) Signed: 08/18/2020 5:00:25 PM By: [name], [name] Entered By: [name] on 08/18/2020 14:38:40 [name] ([id number]) -------------------------------------------------------------------------------- Vitals Details Patient Name: [name] Date of Service: 08/18/2020 2:00 PM Medical Record Number: [id number] Patient Account Number: [id number] Date of Birth/Sex: 15-Feb-1951 (69 y.o. F) Treating [name]: [name] Primary Care Takita Riecke: [name] Other Clinician: Referring Bayan Hedstrom: [name] Treating Hennessy Bartel/Extender: [name]: 4 Vital Signs Time Taken: 14:27 Temperature (F): 98.2 Height (in): 64 Pulse (bpm): 82 Weight (lbs): 265 Respiratory Rate (breaths/min): 18 Body Mass Index (BMI): 45.5 Blood Pressure (mmHg): 120/72 Reference Range: 80 - 120 mg / dl Electronic Signature(s) Signed: 08/18/2020 5:00:25 PM By: [name], [name] Entered By: [name], [name] Breeding on 08/18/2020 14:27:21

## 2020-08-25 ENCOUNTER — Encounter: Payer: Medicare Other | Admitting: Internal Medicine

## 2020-08-25 ENCOUNTER — Other Ambulatory Visit: Payer: Self-pay

## 2020-08-25 DIAGNOSIS — L89623 Pressure ulcer of left heel, stage 3: Secondary | ICD-10-CM | POA: Diagnosis not present

## 2020-08-26 NOTE — Progress Notes (Signed)
[name], [name] ([id number]) Visit Report for 08/25/2020 Arrival Information Details Patient Name: [name], [name] Date of Service: 08/25/2020 2:00 PM Medical Record Number: [id number] Patient Account Number: [id number] Date of Birth/Sex: 04-20-1951 (69 y.o. F) Treating RN: Carlene Coria Primary Care Shayanne Gomm: [name] Other Clinician: Referring Malaney Mcbean: [name] Treating Vrinda Heckstall/Extender: Tito Dine in Treatment: 5 Visit Information History Since Last Visit Added or deleted any medications: No Patient Arrived: Wheel Chair Had a fall or experienced change in No Arrival Time: 14:18 activities of daily living that may affect Accompanied By: husband risk of falls: Transfer Assistance: None Hospitalized since last visit: No Patient Identification Verified: Yes Pain Present Now: Yes Secondary Verification Process Completed: Yes Electronic Signature(s) Signed: 08/25/2020 3:13:24 PM By: [name] Entered By: [name] on 08/25/2020 14:20:01 [name] ([id number]) -------------------------------------------------------------------------------- Clinic Level of Care Assessment Details Patient Name: [name] Date of Service: 08/25/2020 2:00 PM Medical Record Number: [id number] Patient Account Number: [id number] Date of Birth/Sex: [birth year] (69 y.o. F) Treating RN: Carlene Coria Primary Care Kashis Penley: [name] Other Clinician: Referring Mirielle Byrum: [name] Treating Wymon Swaney/Extender: Tito Dine in Treatment: 5 Clinic Level of Care Assessment Items TOOL 1 Quantity Score '[]'  - Use when EandM and Procedure is performed on INITIAL visit 0 ASSESSMENTS - Nursing Assessment / Reassessment '[]'  - General Physical Exam (combine w/ comprehensive assessment (listed just below) when performed on new 0 pt. evals) '[]'  - 0 Comprehensive Assessment (HX, ROS, Risk Assessments, Wounds Hx, etc.) ASSESSMENTS - Wound and Skin  Assessment / Reassessment '[]'  - Dermatologic / Skin Assessment (not related to wound area) 0 ASSESSMENTS - Ostomy and/or Continence Assessment and Care '[]'  - Incontinence Assessment and Management 0 '[]'  - 0 Ostomy Care Assessment and Management (repouching, etc.) PROCESS - Coordination of Care '[]'  - Simple Patient / Family Education for ongoing care 0 '[]'  - 0 Complex (extensive) Patient / Family Education for ongoing care '[]'  - 0 Staff obtains Programmer, systems, Records, Test Results / Process Orders '[]'  - 0 Staff telephones HHA, Nursing Homes / Clarify orders / etc '[]'  - 0 Routine Transfer to another Facility (non-emergent condition) '[]'  - 0 Routine Hospital Admission (non-emergent condition) '[]'  - 0 New Admissions / Biomedical engineer / Ordering NPWT, Apligraf, etc. '[]'  - 0 Emergency Hospital Admission (emergent condition) PROCESS - Special Needs '[]'  - Pediatric / Minor Patient Management 0 '[]'  - 0 Isolation Patient Management '[]'  - 0 Hearing / Language / Visual special needs '[]'  - 0 Assessment of Community assistance (transportation, D/C planning, etc.) '[]'  - 0 Additional assistance / Altered mentation '[]'  - 0 Support Surface(s) Assessment (bed, cushion, seat, etc.) INTERVENTIONS - Miscellaneous '[]'  - External ear exam 0 '[]'  - 0 Patient Transfer (multiple staff / Civil Service fast streamer / Similar devices) '[]'  - 0 Simple Staple / Suture removal (25 or less) '[]'  - 0 Complex Staple / Suture removal (26 or more) '[]'  - 0 Hypo/Hyperglycemic Management (do not check if billed separately) '[]'  - 0 Ankle / Brachial Index (ABI) - do not check if billed separately Has the patient been seen at the hospital within the last three years: Yes Total Score: 0 Level Of Care: ____ [name] ([id number]) Electronic Signature(s) Signed: 08/25/2020 5:33:58 PM By: Carlene Coria RN Entered By: Carlene Coria on 08/25/2020 15:01:28 Falkner, [name]  ([id number]) -------------------------------------------------------------------------------- Compression Therapy Details Patient Name: [name] Date of Service: 08/25/2020 2:00 PM Medical Record Number: [id number] Patient Account Number: [id number] Date of Birth/Sex: [birth year] (69 y.o.  F) Treating RN: Carlene Coria Primary Care Reinaldo Helt: [name] Other Clinician: Referring Emmylou Bieker: [name] Treating Tarvares Lant/Extender: Tito Dine in Treatment: 5 Compression Therapy Performed for Wound Assessment: Wound #12 Left Calcaneus Performed By: Clinician Carlene Coria, RN Compression Type: Three Layer Post Procedure Diagnosis Same as Pre-procedure Electronic Signature(s) Signed: 08/25/2020 5:33:58 PM By: Carlene Coria RN Entered By: Carlene Coria on 08/25/2020 15:00:08 [name] ([id number]) -------------------------------------------------------------------------------- Encounter Discharge Information Details Patient Name: [name] Date of Service: 08/25/2020 2:00 PM Medical Record Number: [id number] Patient Account Number: [id number] Date of Birth/Sex: June 11, 1950 (69 y.o. F) Treating RN: Donnamarie Poag Primary Care Tashana Haberl: [name] Other Clinician: Referring Tyjah Hai: [name] Treating Arael Piccione/Extender: Tito Dine in Treatment: 5 Encounter Discharge Information Items Discharge Condition: Stable Ambulatory Status: Wheelchair Discharge Destination: Home Transportation: Private Auto Accompanied By: husband Schedule Follow-up Appointment: Yes Clinical Summary of Care: Electronic Signature(s) Signed: 08/25/2020 4:40:42 PM By: Donnamarie Poag Entered By: Donnamarie Poag on 08/25/2020 15:14:34 [name] ([id number]) -------------------------------------------------------------------------------- Lower Extremity Assessment Details Patient Name: [name] Date of Service: 08/25/2020 2:00 PM Medical Record Number:  [id number] Patient Account Number: [id number] Date of Birth/Sex: July 28, 1950 (69 y.o. F) Treating RN: Carlene Coria Primary Care Neave Lenger: [name] Other Clinician: Referring Leeroy Lovings: [name] Treating Lesa Vandall/Extender: Tito Dine in Treatment: 5 Electronic Signature(s) Signed: 08/25/2020 3:13:24 PM By: [name] Signed: 08/25/2020 5:33:58 PM By: Carlene Coria RN Entered By: [name] on 08/25/2020 14:34:11 Falconi, [name] ([id number]) -------------------------------------------------------------------------------- Multi Wound Chart Details Patient Name: [name] Date of Service: 08/25/2020 2:00 PM Medical Record Number: [id number] Patient Account Number: [id number] Date of Birth/Sex: 1950/09/01 (69 y.o. F) Treating RN: Carlene Coria Primary Care Emmely Bittinger: [name] Other Clinician: Referring Tahlor Berenguer: [name] Treating Ranferi Clingan/Extender: Tito Dine in Treatment: 5 Vital Signs Height(in): 64 Pulse(bpm): 108 Weight(lbs): 265 Blood Pressure(mmHg): 113/71 Body Mass Index(BMI): 45 Temperature(F): 98.2 Respiratory Rate(breaths/min): 18 Photos: [N/A:N/A] Wound Location: Left Calcaneus N/A N/A Wounding Event: Pressure Injury N/A N/A Primary Etiology: Pressure Ulcer N/A N/A Comorbid History: Cataracts, Chronic Obstructive N/A N/A Pulmonary Disease (COPD), Sleep Apnea, Hypertension, Osteoarthritis, Neuropathy Date Acquired: 06/27/2020 N/A N/A Weeks of Treatment: 5 N/A N/A Wound Status: Open N/A N/A Measurements L x W x D (cm) 2.2x1.7x0.2 N/A N/A Area (cm) : 2.937 N/A N/A Volume (cm) : 0.587 N/A N/A % Reduction in Area: 42.50% N/A N/A % Reduction in Volume: 42.50% N/A N/A Classification: Category/Stage III N/A N/A Exudate Amount: Medium N/A N/A Exudate Type: Serosanguineous N/A N/A Exudate Color: red, brown N/A N/A Wound Margin: Thickened N/A N/A Granulation Amount: Medium (34-66%) N/A N/A Granulation  Quality: Red, Pink N/A N/A Necrotic Amount: Medium (34-66%) N/A N/A Exposed Structures: Fat Layer (Subcutaneous Tissue): N/A N/A Yes Fascia: No Tendon: No Muscle: No Joint: No Bone: No Epithelialization: None N/A N/A Treatment Notes Electronic Signature(s) Signed: 08/25/2020 5:33:58 PM By: Carlene Coria RN Entered By: Carlene Coria on 08/25/2020 14:59:53 Sorbo, [name] ([id number]) -------------------------------------------------------------------------------- Multi-Disciplinary Care Plan Details Patient Name: [name] Date of Service: 08/25/2020 2:00 PM Medical Record Number: [id number] Patient Account Number: [id number] Date of Birth/Sex: 06-Aug-1950 (69 y.o. F) Treating RN: Carlene Coria Primary Care Aero Drummonds: [name] Other Clinician: Referring Monisha Siebel: [name] Treating Jenise Iannelli/Extender: Tito Dine in Treatment: 5 Active Inactive Wound/Skin Impairment Nursing Diagnoses: Impaired tissue integrity Goals: Patient/caregiver will verbalize understanding of skin care regimen Date Initiated: 07/20/2020 Target Resolution Date: 09/19/2020 Goal Status: Active Ulcer/skin breakdown will have a volume  reduction of 30% by week 4 Date Initiated: 07/20/2020 Date Inactivated: 08/25/2020 Target Resolution Date: 08/20/2020 Goal Status: Met Ulcer/skin breakdown will have a volume reduction of 50% by week 8 Date Initiated: 07/20/2020 Target Resolution Date: 09/19/2020 Goal Status: Active Ulcer/skin breakdown will have a volume reduction of 80% by week 12 Date Initiated: 07/20/2020 Target Resolution Date: 10/20/2020 Goal Status: Active Ulcer/skin breakdown will heal within 14 weeks Date Initiated: 07/20/2020 Target Resolution Date: 11/19/2020 Goal Status: Active Interventions: Assess patient/caregiver ability to obtain necessary supplies Assess patient/caregiver ability to perform ulcer/skin care regimen upon admission and as needed Provide education on  ulcer and skin care Treatment Activities: Referred to DME Claudeen Leason for dressing supplies : 07/20/2020 Skin care regimen initiated : 07/20/2020 Notes: Electronic Signature(s) Signed: 08/25/2020 5:33:58 PM By: Carlene Coria RN Entered By: Carlene Coria on 08/25/2020 14:59:43 Herzig, [name] ([id number]) -------------------------------------------------------------------------------- Pain Assessment Details Patient Name: [name] Date of Service: 08/25/2020 2:00 PM Medical Record Number: [id number] Patient Account Number: [id number] Date of Birth/Sex: 17-Jun-1950 (69 y.o. F) Treating RN: Carlene Coria Primary Care Jatziry Wechter: [name] Other Clinician: Referring Lakenya Riendeau: [name] Treating Efraim Vanallen/Extender: Tito Dine in Treatment: 5 Active Problems Location of Pain Severity and Description of Pain Patient Has Paino Yes Site Locations Rate the pain. Current Pain Level: 3 Pain Management and Medication Current Pain Management: Electronic Signature(s) Signed: 08/25/2020 3:13:24 PM By: [name] Signed: 08/25/2020 5:33:58 PM By: Carlene Coria RN Entered By: [name] on 08/25/2020 14:21:23 [name] ([id number]) -------------------------------------------------------------------------------- Patient/Caregiver Education Details Patient Name: [name] Date of Service: 08/25/2020 2:00 PM Medical Record Number: [id number] Patient Account Number: [id number] Date of Birth/Gender: [birth year] (69 y.o. F) Treating RN: Carlene Coria Primary Care Physician: [name] Other Clinician: Referring Physician: [name] Treating Physician/Extender: Tito Dine in Treatment: 5 Education Assessment Education Provided To: Patient Education Topics Provided Wound/Skin Impairment: Methods: Explain/Verbal Responses: State content correctly Electronic Signature(s) Signed: 08/25/2020 5:33:58 PM By: Carlene Coria RN Entered By:  Carlene Coria on 08/25/2020 15:01:48 [name] ([id number]) -------------------------------------------------------------------------------- Wound Assessment Details Patient Name: [name] Date of Service: 08/25/2020 2:00 PM Medical Record Number: [id number] Patient Account Number: [id number] Date of Birth/Sex: July 21, 1950 (69 y.o. F) Treating RN: Carlene Coria Primary Care Mazie Fencl: [name] Other Clinician: Referring Nadine Ryle: [name] Treating Gurdeep Keesey/Extender: Tito Dine in Treatment: 5 Wound Status Wound Number: 12 Primary Pressure Ulcer Etiology: Wound Location: Left Calcaneus Wound Open Wounding Event: Pressure Injury Status: Date Acquired: 06/27/2020 Comorbid Cataracts, Chronic Obstructive Pulmonary Disease Weeks Of Treatment: 5 History: (COPD), Sleep Apnea, Hypertension, Osteoarthritis, Clustered Wound: No Neuropathy Photos Wound Measurements Length: (cm) 2.2 Width: (cm) 1.7 Depth: (cm) 0.2 Area: (cm) 2.937 Volume: (cm) 0.587 % Reduction in Area: 42.5% % Reduction in Volume: 42.5% Epithelialization: None Tunneling: No Undermining: No Wound Description Classification: Category/Stage III Wound Margin: Thickened Exudate Amount: Medium Exudate Type: Serosanguineous Exudate Color: red, brown Foul Odor After Cleansing: No Slough/Fibrino Yes Wound Bed Granulation Amount: Medium (34-66%) Exposed Structure Granulation Quality: Red, Pink Fascia Exposed: No Necrotic Amount: Medium (34-66%) Fat Layer (Subcutaneous Tissue) Exposed: Yes Necrotic Quality: Adherent Slough Tendon Exposed: No Muscle Exposed: No Joint Exposed: No Bone Exposed: No Treatment Notes Wound #12 (Calcaneus) Wound Laterality: Left Cleanser Normal Saline Discharge Instruction: Wash your hands with soap and water. Remove old dressing, discard into plastic bag and place into trash. Cleanse the wound with Normal Saline prior to applying a clean dressing  using gauze sponges, not tissues or  cotton balls. Do not Chohan, Shakima H. ([id number]) scrub or use excessive force. Pat dry using gauze sponges, not tissue or cotton balls. Peri-Wound Care Topical Primary Dressing Hydrofera Blue Ready Transfer Foam, 4x5 (in/in) Discharge Instruction: Apply Hydrofera Blue Ready to wound bed as directed Secondary Dressing ABD Pad 5x9 (in/in) Discharge Instruction: Cover with ABD pad Secured With Compression Wrap Profore Lite LF 3 Multilayer Compression [place] Discharge Instruction: Apply 3 multi-layer wrap as prescribed. Compression Stockings Add-Ons Electronic Signature(s) Signed: 08/25/2020 3:13:24 PM By: [name] Signed: 08/25/2020 5:33:58 PM By: Carlene Coria RN Entered By: [name] on 08/25/2020 14:33:58 [name] ([id number]) -------------------------------------------------------------------------------- Vitals Details Patient Name: [name] Date of Service: 08/25/2020 2:00 PM Medical Record Number: [id number] Patient Account Number: [id number] Date of Birth/Sex: Mar 13, 1951 (69 y.o. F) Treating RN: Carlene Coria Primary Care Jonovan Boedecker: [name] Other Clinician: Referring Loria Lacina: [name] Treating Elenie Coven/Extender: Tito Dine in Treatment: 5 Vital Signs Time Taken: 14:20 Temperature (F): 98.2 Height (in): 64 Pulse (bpm): 108 Weight (lbs): 265 Respiratory Rate (breaths/min): 18 Body Mass Index (BMI): 45.5 Blood Pressure (mmHg): 113/71 Reference Range: 80 - 120 mg / dl Electronic Signature(s) Signed: 08/25/2020 3:13:24 PM By: [name] Entered By: [name] on 08/25/2020 14:21:09

## 2020-08-26 NOTE — Progress Notes (Signed)
[name], [name] ([id number]) Visit Report for 08/25/2020 HPI Details Patient Name: [name], [name] Date of Service: 08/25/2020 2:00 PM Medical Record Number: [id number] Patient Account Number: [id number] Date of Birth/Sex: 1951/01/24 (70 y.o. F) Treating RN: Carlene Coria Primary Care Provider: [name] Other Clinician: Referring Provider: [name] Treating Provider/Extender: Tito Dine in Treatment: 5 History of Present Illness Location: left and right lower extremity and dorsum of the right foot Quality: Patient reports experiencing a sharp pain to affected area(s). Severity: Patient rates the pain to be a 8 out of 10 especially with palpation Duration: Patient has had the wound for > 6 months prior to seeking treatment at the wound center Timing: Pain in wound is Intermittent in severity but persistent Context: The wound appeared gradually over time Modifying Factors: Other treatment(s) tried include: symptomatic treatment as advised by her PCP Associated Signs and Symptoms: Patient reports having increase swelling iin her bilateral lower extremities HPI Description: 70 year old with a past medical history significant for MS, urinary incontinence, and obesity. She has been seen in the wound clinic before for lower extremity ulcerations treated with compression therapy. she is also known to have hypertension, peripheral vascular disease, COPD, obstructive sleep apnea, lumbar radiculopathy, kyphoscoliosis, urinary issues and tobacco abuse. Smokes a packet of cigarettes a day was recently seen at the [facility] for swelling of her legs and feet with a ulceration on the dorsum of the right foot which has been there for about 6 months. she was recently in the ER about a month ago where she was seen for shortness of breath and swelling of the legs and a chest x-ray was within normal limits had an increase in her BNP and was given Lasix and put on  doxycycline for a mild cellulitis and possible UTI. Wounds aresmaller today 11/13/15. Debrided and will continue Santyl. 12/21/2015 -- she was admitted to the hospital overnight on 12/18/2015 and diagnosed with urinary retention and cellulitis of the left lower leg. is past to take clindamycin and use Santyl for her wounds. 01/15/2016 -- come back to see [place] for almost a month and continues to be noncompliant with her dressings 01/30/16 patient presents today for a follow-up visit concerning her ongoing bilateral lower extremity wounds. We have not seen her for the past 2 weeks although we are supposed to be seen her for weekly visits. She currently has a Foley catheter. She tells me that the wounds are intensely painful especially with pressure and palpation at this point in time. Fortunately she is having no interval signs or symptoms of systemic infection but unfortunately the wounds have not improved dramatically since we last saw her. She does have home health coming to take care of her as well. She is currently not in any compression wraps. 02/06/16 ON evaluation today patient continues to experience discomfort regarding her bilateral lower extremity ulcers. She has continued to tolerate the dressing changes at this point in time and continues to have a Foley catheter as well. Fortunately she is back this week in the past she has been somewhat noncompliant with follow-ups I'm glad to see her today. She tells me that her pain level varies but can be as high as a 7 out of 10 with manipulation of the wound. She tells me that she used to be on oxycodone which was managed by the pain clinic although she is no longer on that as she tells me that she was actually smoking marijuana at the time and  when this was found out they discontinued her pain medication. She no longer is taking anything pain medication wise and she also does not smoke cigarettes nor marijuana at this point. 02/12/16; this is a  patient I don't believe I have previously seen. She has multiple sclerosis. She has 4 punched-out areas on the anterior lateral left leg 2 areas on the right these are all in the same condition. Reasonably small [dime size] wounds each was some depth. Surface of these wounds does not look particularly healthy as there is adherent slough. There is no evidence of surrounding infection or inflammation. ABIs in this clinic were 0.87 on the right and 0.81 on the left. She is listed as having PAD and is a smoker. Not a diabetic 02/20/16 patient I gave antibiotics to last week for erythema around both wound sites on the left lateral leg and right lateral leg. This appears to be a lot better. One of the areas on the left leg is healed however she still has 3 punched-out open areas on the left lateral calf and one on the right lateral calf and one on the dorsal foot. She is an ex-smoker quitting 1 month ago. ABIs in this clinic were 0.87 on the right 0.81 on the left 03/12/16; this is a patient I really don't have a good sense of. It would appear for the first 5 or 6 weeks of her stay in this clinic she was cared for by Dr. Con Memos. She appeared on my schedule in mid October and I saw her twice. She has not been here however in 3 weeks. She has advanced Wound Care at home where she lives with her husband. She has multiple sclerosis. I saw her the first time she had 4 punched-out small wounds on the anterior lateral left leg it appears that she is now down to 2. She also had wounds on the lateral and medial aspect of her right leg which were also small and punched out. The only one that remains is on the right lateral. Because of the nature of her wound I went ahead and ordered formal arterial studies this showed an ABI of 1 on the right and one on the left. TBI of 1.4 on the right and 0.79 on the left. She had normal waveforms. She was in the emergency room on 02/28/16; there is a noted edema of her left leg after  a fall. She had a duplex ultrasound that showed no evidence for an acute DVT from the left groin to the popliteal fossa. The study was limited in the calf veins due to edema. Also noticeable that she had a left inguinal enlarged lymph node up to 6.2 cm. She also had a left knee x-ray that showed no acute findings and a right foot x-ray that showed soft tissue swelling but no radiographic evidence of osteomyelitis. The patient does stop smoking 03-19-16 Ms Schweizer presents today for evaluation of her bilateral lower extremity ulcerations. She states that she has not smoked in several weeks. She denies any pain or discomfort to bilateral lower extremities, tolerating compression therapy as ordered. 03/26/16; I have not seen this patient in 2 weeks however she has done very well with improvements in the areas in question. After we obtain normal arterial studies, increasing the 4-layer compression really seems to look done a nice job here. She has one open area on the left lateral leg and one on the medial right leg. These have filled in nicely and are now  superficial wounds that showed epithelialized. I note the left inguinal lymph node at 6.2 cm. I may need to refer this back to the patient's primary doctor. 04/03/16; patient has 2 remaining wounds 1 on the left lateral leg and one on the right medial leg. Both of filled in nicely since we are able to increase her from a 3 layer to a 4 layer compression. I am also following up on the lymph node notable on her left leg DVT rule out in November 04/10/16; area on the right lateral leg is healed. The area on the left leg is still open but looks considerably better with healthy granulation and less wound area. DELANE, STALLING ([id number]) I12/20/17; last week we healed the patient doubt with regards to the wound on the right leg to her on compression stocking 20-30 mmHg as it turns out I don't think she had a stocking, predictably therefore she has reopened.  The left leg as well as the wound on the lateral aspect. Been generally small line 04/24/16; we healed out her right leg 2 weeks ago to her own 20-30 mm compression stockings although as it turns out she didn't have these and did not purchase some apparently because of financial issues. The left leg wound is on the lateral aspect. Both of these wounds are just about healed. 05/01/16; her wounds on the left leg are healed out today. The area on the right leg is also healed. Vitamin diligent effort of our staff we have not been able to get any form of compression stocking to this patient. The orders for juxtalite's were given to home health this never materialized. We ordered compression stockings I don't think she was able to afford these. We had a discussion today with both the patient and her husband without these, will accumulate edema and the wounds will simply reopen again. 05/14/16 READMISSION this is a patient we healed out 2 weeks ago. As bilateral lower extremity venous wounds probably some degree of lymphedema. At the beginning in November I did a duplex ultrasound of her left leg that was negative for DVT but did show a lymph node in the left inguinal area presumably reactive. We've been using compression to her lower extremities eventually healed all her wounds on her bilateral calves but we were not able to get any form of compression stockings for her in spite of intense efforts of our staff. She returns today with reopening on the left leg did not the right she has several superficial areas anteriorly but an actual frank ulcer on the lateral left calf. This is about the size of a dime or smaller. She does not really complain of pain. The patient has a history of PAD however arterial studies we did in November were normal. Her ABIs and Doppler waveforms were both within the normal limits. The patient has a history of MS. In discussing things with her today it would appear that the opening  was noted 2 weeks ago according to the patient. We gave her Tubigrip stockings when she left the clinic 05/21/16; she has not yet had the duplex ultrasound of her thigh, this is booked for Thursday. The wound on the left lateral lower leg is improved 05/28/16; her duplex ultrasound of the left leg specifically her left thigh did not show a DVT however did show hypoechogenic enlarged left inguinal lymph nodes up to 6.9 x 3.7 x 6.8. This also showed in the one in November however maximal diameter was 6.2, at that  point I thought these were reactive secondary to cellulitis however there is obviously a differential here. Her wound on the left lower leg is closed. She has a superficial open area on the base of her left fifth metatarsal head on the plantar foot. It looks as though she has some wrap injuries on the anterior leg 06/05/16; since she was last year she had a fall on 1/31. She was seen in the ER but sent back home. I think she is also been seen by her primary physician who picked up on the swelling and the lymphadenopathy. She is ordered a CT scan of the abdomen and pelvis however this will be without contrast because of stage IIIB renal insufficiency. Nevertheless this should be a start the workup to make sure there is not is more systemic problem here. She will probably need consideration of a biopsy of the lymph node in her inguinal area, this would need general surgery. As far as her wounds are concern today the area on the left plantar foot is healed. She has a small weeping area on the left lower leg. The wrap injury from last week has largely Pardoxically there is a lot less edema in the left thigh. 06/12/16; CT scan is on Friday. She has 2 small open areas one on the lateral left lower leg and one on the anterior lower leg on the left. Both of these looks healthy. I reduced her compression last week to Kerlix and Coban this seems to have not a reasonable job 07/02/16; the patient is now in the  skilled facility. I think this was arranged out of the ER when she fell at home although I'm not exactly sure. She is going for I think a laparotomy for the pelvic mass next week. All the wounds in the left leg have healed Readmission: 07/20/2020 upon evaluation today patient presents for reevaluation here in clinic unfortunately she is having issues with a significant stage III pressure ulcer to her left heel. This is something that occurred when she was in the hospital sick. And subsequently though she does not appear to have any new injury she is still trying to recover from the pressure injury which was quite severe during that time. I do not see any signs of infection right now which is great news. With that being said she has been require some debridement to try to clear this up as much as possible. We need to get the wound VAC cleared so that she can see overall healing and improvement that were looking for. The patient does have a history of hypertension, chronic kidney disease stage III, COPD, and multiple sclerosis. Currently she has been trying to do what she can to keep pressure off of her heel while in bed and in other locations as well. 08/04/20 upon evaluation today patient appears to be doing extremely well currently in regard to her wound on the heel. I do believe the Iodoflex has helped loosen things appear. Unfortunately she tells me that the home health nurse has not been putting the Iodoflex on the wound. That is news to [place] and that they tell me they got supplies in the mail but they did not see the Iodoflex being used. Nonetheless I am wondering if this is just something that they missed or if it truly has not been. I did tell them to look when they get home in their box of supplies to see if they saw the dressing which showed and what it should  look like. 08/11/2020 upon evaluation today patient appears to be doing decently well in regard to her wound although unfortunately this is  showing signs of some blue-green drainage consistent with Pseudomonas it does have a little worried in this regard. Fortunately there is no evidence of active infection which is great news. No fevers, chills, nausea, vomiting, or diarrhea. 08/18/2020 upon evaluation today patient appears to be doing well with regard to her wound. She has been tolerating the dressing changes without complication. Fortunately there is no signs of active infection at this time. No fevers, chills, nausea, vomiting, or diarrhea. 4/29; left heel wound pressure ulcer. Slightly smaller. Electronic Signature(s) Signed: 08/25/2020 4:40:00 PM By: Linton Ham MD Entered By: Linton Ham on 08/25/2020 15:19:05 [name] ([id number]) -------------------------------------------------------------------------------- Physical Exam Details Patient Name: [name] Date of Service: 08/25/2020 2:00 PM Medical Record Number: [id number] Patient Account Number: [id number] Date of Birth/Sex: 1950-12-12 (70 y.o. F) Treating RN: Carlene Coria Primary Care Provider: [name] Other Clinician: Referring Provider: [name] Treating Provider/Extender: Tito Dine in Treatment: 5 Constitutional Sitting or standing Blood Pressure is within target range for patient.. Pulse regular and within target range for patient.Marland Kitchen Respirations regular, non- labored and within target range.. Temperature is normal and within the target range for the patient.Marland Kitchen appears in no distress. Cardiovascular Pedal pulses are palpable on the left. Notes Wound exam; under illumination there is some surface slough but most of this look like healthy granulation. I did not remove this today. Pedal pulses are palpable. There is no evidence of surrounding infection Electronic Signature(s) Signed: 08/25/2020 4:40:00 PM By: Linton Ham MD Entered By: Linton Ham on 08/25/2020 15:19:56 [name]  ([id number]) -------------------------------------------------------------------------------- Physician Orders Details Patient Name: [name] Date of Service: 08/25/2020 2:00 PM Medical Record Number: [id number] Patient Account Number: [id number] Date of Birth/Sex: 1950-12-17 (70 y.o. F) Treating RN: Carlene Coria Primary Care Provider: [name] Other Clinician: Referring Provider: [name] Treating Provider/Extender: Tito Dine in Treatment: 5 Verbal / Phone Orders: No Diagnosis Coding Follow-up Appointments o Return Appointment in 1 week. [place]: - [facility] [place] for wound care. May utilize formulary equivalent dressing for wound treatment orders unless otherwise specified. Home Health Nurse may visit PRN to address patientos wound care needs. o Scheduled days for dressing changes to be completed; exception, patient has scheduled wound care visit that day. - Mondays and Thursdays o **Please direct any NON-WOUND related issues/requests for orders to patient's Primary Care Physician. **If current dressing causes regression in wound condition, may D/C ordered dressing product/s and apply Normal Saline Moist Dressing daily until next [place] or Other MD appointment. **Notify Wound Healing Center of regression in wound condition at [phone]. Edema Control - Lymphedema / Segmental Compressive Device / Other o Optional: One layer of unna paste to top of compression wrap (to act as an anchor). o Elevate, Exercise Daily and Avoid Standing for Long Periods of Time. o Elevate legs to the level of the heart and pump ankles as often as possible o Elevate leg(s) parallel to the floor when sitting. Off-Loading o Open toe surgical shoe o Other: - Keep pressure off left heel Wound Treatment Wound #12 - Calcaneus Wound Laterality: Left Cleanser: Normal Saline 2 x Per Week/30  Days Discharge Instructions: Wash your hands with soap and water. Remove old dressing, discard into plastic bag and place into trash. Cleanse the wound with Normal Saline  prior to applying a clean dressing using gauze sponges, not tissues or cotton balls. Do not scrub or use excessive force. Pat dry using gauze sponges, not tissue or cotton balls. Primary Dressing: Hydrofera Blue Ready Transfer Foam, 4x5 (in/in) (Generic) 2 x Per Week/30 Days Discharge Instructions: Apply Hydrofera Blue Ready to wound bed as directed Secondary Dressing: ABD Pad 5x9 (in/in) 2 x Per Week/30 Days Discharge Instructions: Cover with ABD pad Compression Wrap: Profore Lite LF 3 Multilayer Compression Bandaging System 2 x Per Week/30 Days Discharge Instructions: Apply 3 multi-layer wrap as prescribed. Electronic Signature(s) Signed: 08/25/2020 4:40:00 PM By: Linton Ham MD Signed: 08/25/2020 5:33:58 PM By: Carlene Coria RN Entered By: Carlene Coria on 08/25/2020 15:00:31 [name] -------------------------------------------------------------------------------- Progress Note Details Patient Name: [name] Date of Service: 08/25/2020 2:00 PM Medical Record Number: [id number] Patient Account Number: [id number] Date of Birth/Sex: 1950/05/12 (70 y.o. F) Treating RN: Carlene Coria Primary Care Provider: [name] Other Clinician: Referring Provider: [name] Treating Provider/Extender: Tito Dine in Treatment: 5 Subjective History of Present Illness (HPI) The following HPI elements were documented for the patient's wound: Location: left and right lower extremity and dorsum of the right foot Quality: Patient reports experiencing a sharp pain to affected area(s). Severity: Patient rates the pain to be a 8 out of 10 especially with palpation Duration: Patient has had the wound for > 6 months prior to seeking treatment at the wound center Timing: Pain in wound is  Intermittent in severity but persistent Context: The wound appeared gradually over time Modifying Factors: Other treatment(s) tried include: symptomatic treatment as advised by her PCP Associated Signs and Symptoms: Patient reports having increase swelling iin her bilateral lower extremities 70 year old with a past medical history significant for MS, urinary incontinence, and obesity. She has been seen in the wound clinic before for lower extremity ulcerations treated with compression therapy. she is also known to have hypertension, peripheral vascular disease, COPD, obstructive sleep apnea, lumbar radiculopathy, kyphoscoliosis, urinary issues and tobacco abuse. Smokes a packet of cigarettes a day was recently seen at the [facility] for swelling of her legs and feet with a ulceration on the dorsum of the right foot which has been there for about 6 months. she was recently in the ER about a month ago where she was seen for shortness of breath and swelling of the legs and a chest x-ray was within normal limits had an increase in her BNP and was given Lasix and put on doxycycline for a mild cellulitis and possible UTI. Wounds aresmaller today 11/13/15. Debrided and will continue Santyl. 12/21/2015 -- she was admitted to the hospital overnight on 12/18/2015 and diagnosed with urinary retention and cellulitis of the left lower leg. is past to take clindamycin and use Santyl for her wounds. 01/15/2016 -- come back to see [place] for almost a month and continues to be noncompliant with her dressings 01/30/16 patient presents today for a follow-up visit concerning her ongoing bilateral lower extremity wounds. We have not seen her for the past 2 weeks although we are supposed to be seen her for weekly visits. She currently has a Foley catheter. She tells me that the wounds are intensely painful especially with pressure and palpation at this point in time. Fortunately she is having no interval signs  or symptoms of systemic infection but unfortunately the wounds have not improved dramatically since we last saw her. She does have home health coming to take care of her  as well. She is currently not in any compression wraps. 02/06/16 ON evaluation today patient continues to experience discomfort regarding her bilateral lower extremity ulcers. She has continued to tolerate the dressing changes at this point in time and continues to have a Foley catheter as well. Fortunately she is back this week in the past she has been somewhat noncompliant with follow-ups I'm glad to see her today. She tells me that her pain level varies but can be as high as a 7 out of 10 with manipulation of the wound. She tells me that she used to be on oxycodone which was managed by the pain clinic although she is no longer on that as she tells me that she was actually smoking marijuana at the time and when this was found out they discontinued her pain medication. She no longer is taking anything pain medication wise and she also does not smoke cigarettes nor marijuana at this point. 02/12/16; this is a patient I don't believe I have previously seen. She has multiple sclerosis. She has 4 punched-out areas on the anterior lateral left leg 2 areas on the right these are all in the same condition. Reasonably small [dime size] wounds each was some depth. Surface of these wounds does not look particularly healthy as there is adherent slough. There is no evidence of surrounding infection or inflammation. ABIs in this clinic were 0.87 on the right and 0.81 on the left. She is listed as having PAD and is a smoker. Not a diabetic 02/20/16 patient I gave antibiotics to last week for erythema around both wound sites on the left lateral leg and right lateral leg. This appears to be a lot better. One of the areas on the left leg is healed however she still has 3 punched-out open areas on the left lateral calf and one on the right lateral  calf and one on the dorsal foot. She is an ex-smoker quitting 1 month ago. ABIs in this clinic were 0.87 on the right 0.81 on the left 03/12/16; this is a patient I really don't have a good sense of. It would appear for the first 5 or 6 weeks of her stay in this clinic she was cared for by Dr. Con Memos. She appeared on my schedule in mid October and I saw her twice. She has not been here however in 3 weeks. She has advanced Wound Care at home where she lives with her husband. She has multiple sclerosis. I saw her the first time she had 4 punched-out small wounds on the anterior lateral left leg it appears that she is now down to 2. She also had wounds on the lateral and medial aspect of her right leg which were also small and punched out. The only one that remains is on the right lateral. Because of the nature of her wound I went ahead and ordered formal arterial studies this showed an ABI of 1 on the right and one on the left. TBI of 1.4 on the right and 0.79 on the left. She had normal waveforms. She was in the emergency room on 02/28/16; there is a noted edema of her left leg after a fall. She had a duplex ultrasound that showed no evidence for an acute DVT from the left groin to the popliteal fossa. The study was limited in the calf veins due to edema. Also noticeable that she had a left inguinal enlarged lymph node up to 6.2 cm. She also had a left knee x-ray that  showed no acute findings and a right foot x-ray that showed soft tissue swelling but no radiographic evidence of osteomyelitis. The patient does stop smoking 03-19-16 Ms Kinn presents today for evaluation of her bilateral lower extremity ulcerations. She states that she has not smoked in several weeks. She denies any pain or discomfort to bilateral lower extremities, tolerating compression therapy as ordered. 03/26/16; I have not seen this patient in 2 weeks however she has done very well with improvements in the areas in question. After we  obtain normal arterial studies, increasing the 4-layer compression really seems to look done a nice job here. She has one open area on the left lateral leg and one on the medial right leg. These have filled in nicely and are now superficial wounds that showed epithelialized. I note the left inguinal lymph node at 6.2 cm. I may need to refer this back to the patient's primary doctor. 04/03/16; patient has 2 remaining wounds 1 on the left lateral leg and one on the right medial leg. Both of filled in nicely since we are able to increase her from a 3 layer to a 4 layer compression. I am also following up on the lymph node notable on her left leg DVT rule out in November 04/10/16; area on the right lateral leg is healed. The area on the left leg is still open but looks considerably better with healthy granulation and less wound area. I12/20/17; last week we healed the patient doubt with regards to the wound on the right leg to her on compression stocking 20-30 mmHg as it turns out I don't think she had a stocking, predictably therefore she has reopened. The left leg as well as the wound on the lateral aspect. Been generally small line Sobiech, Aneth H. ([id number]) 04/24/16; we healed out her right leg 2 weeks ago to her own 20-30 mm compression stockings although as it turns out she didn't have these and did not purchase some apparently because of financial issues. The left leg wound is on the lateral aspect. Both of these wounds are just about healed. 05/01/16; her wounds on the left leg are healed out today. The area on the right leg is also healed. Vitamin diligent effort of our staff we have not been able to get any form of compression stocking to this patient. The orders for juxtalite's were given to home health this never materialized. We ordered compression stockings I don't think she was able to afford these. We had a discussion today with both the patient and her husband without these, will  accumulate edema and the wounds will simply reopen again. 05/14/16 READMISSION this is a patient we healed out 2 weeks ago. As bilateral lower extremity venous wounds probably some degree of lymphedema. At the beginning in November I did a duplex ultrasound of her left leg that was negative for DVT but did show a lymph node in the left inguinal area presumably reactive. We've been using compression to her lower extremities eventually healed all her wounds on her bilateral calves but we were not able to get any form of compression stockings for her in spite of intense efforts of our staff. She returns today with reopening on the left leg did not the right she has several superficial areas anteriorly but an actual frank ulcer on the lateral left calf. This is about the size of a dime or smaller. She does not really complain of pain. The patient has a history of PAD however arterial studies  we did in November were normal. Her ABIs and Doppler waveforms were both within the normal limits. The patient has a history of MS. In discussing things with her today it would appear that the opening was noted 2 weeks ago according to the patient. We gave her Tubigrip stockings when she left the clinic 05/21/16; she has not yet had the duplex ultrasound of her thigh, this is booked for Thursday. The wound on the left lateral lower leg is improved 05/28/16; her duplex ultrasound of the left leg specifically her left thigh did not show a DVT however did show hypoechogenic enlarged left inguinal lymph nodes up to 6.9 x 3.7 x 6.8. This also showed in the one in November however maximal diameter was 6.2, at that point I thought these were reactive secondary to cellulitis however there is obviously a differential here. Her wound on the left lower leg is closed. She has a superficial open area on the base of her left fifth metatarsal head on the plantar foot. It looks as though she has some wrap injuries on the anterior  leg 06/05/16; since she was last year she had a fall on 1/31. She was seen in the ER but sent back home. I think she is also been seen by her primary physician who picked up on the swelling and the lymphadenopathy. She is ordered a CT scan of the abdomen and pelvis however this will be without contrast because of stage IIIB renal insufficiency. Nevertheless this should be a start the workup to make sure there is not is more systemic problem here. She will probably need consideration of a biopsy of the lymph node in her inguinal area, this would need general surgery. As far as her wounds are concern today the area on the left plantar foot is healed. She has a small weeping area on the left lower leg. The wrap injury from last week has largely Pardoxically there is a lot less edema in the left thigh. 06/12/16; CT scan is on Friday. She has 2 small open areas one on the lateral left lower leg and one on the anterior lower leg on the left. Both of these looks healthy. I reduced her compression last week to Kerlix and Coban this seems to have not a reasonable job 07/02/16; the patient is now in the skilled facility. I think this was arranged out of the ER when she fell at home although I'm not exactly sure. She is going for I think a laparotomy for the pelvic mass next week. All the wounds in the left leg have healed Readmission: 07/20/2020 upon evaluation today patient presents for reevaluation here in clinic unfortunately she is having issues with a significant stage III pressure ulcer to her left heel. This is something that occurred when she was in the hospital sick. And subsequently though she does not appear to have any new injury she is still trying to recover from the pressure injury which was quite severe during that time. I do not see any signs of infection right now which is great news. With that being said she has been require some debridement to try to clear this up as much as possible. We need to  get the wound VAC cleared so that she can see overall healing and improvement that were looking for. The patient does have a history of hypertension, chronic kidney disease stage III, COPD, and multiple sclerosis. Currently she has been trying to do what she can to keep pressure off of  her heel while in bed and in other locations as well. 08/04/20 upon evaluation today patient appears to be doing extremely well currently in regard to her wound on the heel. I do believe the Iodoflex has helped loosen things appear. Unfortunately she tells me that the home health nurse has not been putting the Iodoflex on the wound. That is news to [place] and that they tell me they got supplies in the mail but they did not see the Iodoflex being used. Nonetheless I am wondering if this is just something that they missed or if it truly has not been. I did tell them to look when they get home in their box of supplies to see if they saw the dressing which showed and what it should look like. 08/11/2020 upon evaluation today patient appears to be doing decently well in regard to her wound although unfortunately this is showing signs of some blue-green drainage consistent with Pseudomonas it does have a little worried in this regard. Fortunately there is no evidence of active infection which is great news. No fevers, chills, nausea, vomiting, or diarrhea. 08/18/2020 upon evaluation today patient appears to be doing well with regard to her wound. She has been tolerating the dressing changes without complication. Fortunately there is no signs of active infection at this time. No fevers, chills, nausea, vomiting, or diarrhea. 4/29; left heel wound pressure ulcer. Slightly smaller. Objective Constitutional Sitting or standing Blood Pressure is within target range for patient.. Pulse regular and within target range for patient.Marland Kitchen Respirations regular, non- labored and within target range.. Temperature is normal and within the target  range for the patient.Marland Kitchen appears in no distress. Vitals Time Taken: 2:20 PM, Height: 64 in, Weight: 265 lbs, BMI: 45.5, Temperature: 98.2 F, Pulse: 108 bpm, Respiratory Rate: 18 breaths/min, Blood Pressure: 113/71 mmHg. Cardiovascular Pedal pulses are palpable on the left. YENY, HUKILL ([id number]) General Notes: Wound exam; under illumination there is some surface slough but most of this look like healthy granulation. I did not remove this today. Pedal pulses are palpable. There is no evidence of surrounding infection Integumentary (Hair, Skin) Wound #12 status is Open. Original cause of wound was Pressure Injury. The date acquired was: 06/27/2020. The wound has been in treatment 5 weeks. The wound is located on the Left Calcaneus. The wound measures 2.2cm length x 1.7cm width x 0.2cm depth; 2.937cm^2 area and 0.587cm^3 volume. There is Fat Layer (Subcutaneous Tissue) exposed. There is no tunneling or undermining noted. There is a medium amount of serosanguineous drainage noted. The wound margin is thickened. There is medium (34-66%) red, pink granulation within the wound bed. There is a medium (34-66%) amount of necrotic tissue within the wound bed including Adherent Slough. Procedures Wound #12 Pre-procedure diagnosis of Wound #12 is a Pressure Ulcer located on the Left Calcaneus . There was a Three Layer Compression Therapy Procedure by Carlene Coria, RN. Post procedure Diagnosis Wound #12: Same as Pre-Procedure Plan Follow-up Appointments: Return Appointment in 1 week. Home Health: [facility] for wound care. May utilize formulary equivalent dressing for wound treatment orders unless otherwise specified. Home Health Nurse may visit PRN to address patient s wound care needs. Scheduled days for dressing changes to be completed; exception, patient has scheduled wound care visit that day. - Mondays and Thursdays **Please direct any NON-WOUND  related issues/requests for orders to patient's Primary Care Physician. **If current dressing causes regression in wound condition, may D/C ordered dressing product/s  and apply Normal Saline Moist Dressing daily until next [place] or Other MD appointment. **Notify Wound [facility]aling Center of regression in wound condition at [phone]. Edema Control - Lymphedema / Segmental Compressive Device / Other: Optional: One layer of unna paste to top of compression wrap (to act as an anchor). Elevate, Exercise Daily and Avoid Standing for Long Periods of Time. Elevate legs to the level of the heart and pump ankles as often as possible Elevate leg(s) parallel to the floor when sitting. Off-Loading: Open toe surgical shoe Other: - Keep pressure off left heel WOUND #12: - Calcaneus Wound Laterality: Left Cleanser: Normal Saline 2 x Per Week/30 Days Discharge Instructions: Wash your hands with soap and water. Remove old dressing, discard into plastic bag and place into trash. Cleanse the wound with Normal Saline prior to applying a clean dressing using gauze sponges, not tissues or cotton balls. Do not scrub or use excessive force. Pat dry using gauze sponges, not tissue or cotton balls. Primary Dressing: Hydrofera Blue Ready Transfer Foam, 4x5 (in/in) (Generic) 2 x Per Week/30 Days Discharge Instructions: Apply Hydrofera Blue Ready to wound bed as directed Secondary Dressing: ABD Pad 5x9 (in/in) 2 x Per Week/30 Days Discharge Instructions: Cover with ABD pad Compression Wrap: Profore Lite LF 3 Multilayer Compression Bandaging System 2 x Per Week/30 Days Discharge Instructions: Apply 3 multi-layer wrap as prescribed. #1 continue with Hydrofera Blue. ABD and 3 layer compression 2. The wound is gradually improving in terms of dimensions. There was some surface slough I did not debride this today but it may require this again next week Electronic Signature(s) Signed: 08/25/2020 4:40:00 PM By:  Linton Ham MD Entered By: Linton Ham on 08/25/2020 15:20:34 [name] -------------------------------------------------------------------------------- SuperBill Details Patient Name: [name] Date of Service: 08/25/2020 Medical Record Number: [id number] Patient Account Number: [id number] Date of Birth/Sex: 11-22-1950 (70 y.o. F) Treating RN: Carlene Coria Primary Care Provider: [name] Other Clinician: Referring Provider: [name] Treating Provider/Extender: Tito Dine in Treatment: 5 Diagnosis Coding ICD-10 Codes Code Description [phone] Pressure ulcer of left heel, stage 3 I10 Essential (primary) hypertension N18.30 Chronic kidney disease, stage 3 unspecified J44.9 Chronic obstructive pulmonary disease, unspecified G35 Multiple sclerosis Facility Procedures CPT4 Code: [id number] Description: (Facility Use Only) [phone] - [place] L[facility] LT LEG Modifier: Quantity: 1 Physician Procedures CPT4 Code: [id number] Description: [id number] - WC PHYS LEVEL 3 - EST PT Modifier: Quantity: 1 CPT4 Code: Description: ICD-10 Diagnosis Description L89.623 Pressure ulcer of left heel, stage 3 Modifier: Quantity: Electronic Signature(s) Signed: 08/25/2020 4:40:00 PM By: Linton Ham MD Entered By: Linton Ham on 08/25/2020 15:20:56

## 2020-09-04 ENCOUNTER — Encounter: Payer: Medicare Other | Attending: Physician Assistant | Admitting: Physician Assistant

## 2020-09-04 ENCOUNTER — Other Ambulatory Visit: Payer: Self-pay

## 2020-09-04 DIAGNOSIS — E669 Obesity, unspecified: Secondary | ICD-10-CM | POA: Insufficient documentation

## 2020-09-04 DIAGNOSIS — I739 Peripheral vascular disease, unspecified: Secondary | ICD-10-CM | POA: Diagnosis not present

## 2020-09-04 DIAGNOSIS — Z6841 Body Mass Index (BMI) 40.0 and over, adult: Secondary | ICD-10-CM | POA: Diagnosis not present

## 2020-09-04 DIAGNOSIS — F17210 Nicotine dependence, cigarettes, uncomplicated: Secondary | ICD-10-CM | POA: Insufficient documentation

## 2020-09-04 DIAGNOSIS — L89623 Pressure ulcer of left heel, stage 3: Secondary | ICD-10-CM | POA: Insufficient documentation

## 2020-09-04 DIAGNOSIS — Z9119 Patient's noncompliance with other medical treatment and regimen: Secondary | ICD-10-CM | POA: Diagnosis not present

## 2020-09-04 DIAGNOSIS — N183 Chronic kidney disease, stage 3 unspecified: Secondary | ICD-10-CM | POA: Diagnosis not present

## 2020-09-04 DIAGNOSIS — Z86718 Personal history of other venous thrombosis and embolism: Secondary | ICD-10-CM | POA: Insufficient documentation

## 2020-09-04 DIAGNOSIS — G35 Multiple sclerosis: Secondary | ICD-10-CM | POA: Diagnosis not present

## 2020-09-04 DIAGNOSIS — J449 Chronic obstructive pulmonary disease, unspecified: Secondary | ICD-10-CM | POA: Diagnosis not present

## 2020-09-04 DIAGNOSIS — I129 Hypertensive chronic kidney disease with stage 1 through stage 4 chronic kidney disease, or unspecified chronic kidney disease: Secondary | ICD-10-CM | POA: Insufficient documentation

## 2020-09-04 NOTE — Progress Notes (Addendum)
BREIANNA, GUZZO ([id number]) Visit Report for 09/04/2020 Chief Complaint Document Details Patient Name: [name], [name] Date of Service: 09/04/2020 2:00 PM Medical Record Number: [id number] Patient Account Number: [id number] Date of Birth/Sex: May 01, 1950 (70 y.o. F) Treating RN: Donnamarie Poag Primary Care Provider: [name] Other Clinician: Referring Provider: [name] Treating Provider/Extender: [name] in Treatment: 6 Information Obtained from: Patient Chief Complaint Left Heel Pressure Ulcer Electronic Signature(s) Signed: 09/04/2020 2:25:12 PM By: Worthy Keeler PA-C Entered By: Worthy Keeler on 09/04/2020 14:25:12 [name] ([id number]) -------------------------------------------------------------------------------- HPI Details Patient Name: [name] Date of Service: 09/04/2020 2:00 PM Medical Record Number: [id number] Patient Account Number: [id number] Date of Birth/Sex: 08-28-1950 (70 y.o. F) Treating RN: Donnamarie Poag Primary Care Provider: [name] Other Clinician: Referring Provider: [name] Treating Provider/Extender: [name] in Treatment: 6 History of Present Illness Location: left and right lower extremity and dorsum of the right foot Quality: Patient reports experiencing a sharp pain to affected area(s). Severity: Patient rates the pain to be a 8 out of 10 especially with palpation Duration: Patient has had the wound for > 6 months prior to seeking treatment at the wound center Timing: Pain in wound is Intermittent in severity but persistent Context: The wound appeared gradually over time Modifying Factors: Other treatment(s) tried include: symptomatic treatment as advised by her PCP Associated Signs and Symptoms: Patient reports having increase swelling iin her bilateral lower extremities HPI Description: 70 year old with a past medical history significant for [name], urinary incontinence, and obesity. She has  been seen in the wound clinic before for lower extremity ulcerations treated with compression therapy. she is also known to have hypertension, peripheral vascular disease, COPD, obstructive sleep apnea, lumbar radiculopathy, kyphoscoliosis, urinary issues and tobacco abuse. Smokes a packet of cigarettes a day was recently seen at the [facility] for swelling of her legs and feet with a ulceration on the dorsum of the right foot which has been there for about 6 months. she was recently in the ER about a month ago where she was seen for shortness of breath and swelling of the legs and a chest x-ray was within normal limits had an increase in her BNP and was given Lasix and put on doxycycline for a mild cellulitis and possible UTI. Wounds aresmaller today 11/13/15. Debrided and will continue Santyl. 12/21/2015 -- she was admitted to the hospital overnight on 12/18/2015 and diagnosed with urinary retention and cellulitis of the left lower leg. is past to take clindamycin and use Santyl for her wounds. 01/15/2016 -- come back to see [place] for almost a month and continues to be noncompliant with her dressings 01/30/16 patient presents today for a follow-up visit concerning her ongoing bilateral lower extremity wounds. We have not seen her for the past 2 weeks although we are supposed to be seen her for weekly visits. She currently has a Foley catheter. She tells me that the wounds are intensely painful especially with pressure and palpation at this point in time. Fortunately she is having no interval signs or symptoms of systemic infection but unfortunately the wounds have not improved dramatically since we last saw her. She does have home health coming to take care of her as well. She is currently not in any compression wraps. 02/06/16 ON evaluation today patient continues to experience discomfort regarding her bilateral lower extremity ulcers. She has continued to tolerate the dressing changes  at this point in time and continues to have a Foley  catheter as well. Fortunately she is back this week in the past she has been somewhat noncompliant with follow-ups I'm glad to see her today. She tells me that her pain level varies but can be as high as a 7 out of 10 with manipulation of the wound. She tells me that she used to be on oxycodone which was managed by the pain clinic although she is no longer on that as she tells me that she was actually smoking marijuana at the time and when this was found out they discontinued her pain medication. She no longer is taking anything pain medication wise and she also does not smoke cigarettes nor marijuana at this point. 02/12/16; this is a patient I don't believe I have previously seen. She has multiple sclerosis. She has 4 punched-out areas on the anterior lateral left leg 2 areas on the right these are all in the same condition. Reasonably small [dime size] wounds each was some depth. Surface of these wounds does not look particularly healthy as there is adherent slough. There is no evidence of surrounding infection or inflammation. ABIs in this clinic were 0.87 on the right and 0.81 on the left. She is listed as having PAD and is a smoker. Not a diabetic 02/20/16 patient I gave antibiotics to last week for erythema around both wound sites on the left lateral leg and right lateral leg. This appears to be a lot better. One of the areas on the left leg is healed however she still has 3 punched-out open areas on the left lateral calf and one on the right lateral calf and one on the dorsal foot. She is an ex-smoker quitting 1 month ago. ABIs in this clinic were 0.87 on the right 0.81 on the left 03/12/16; this is a patient I really don't have a good sense of. It would appear for the first 5 or 6 weeks of her stay in this clinic she was cared for by Dr. Con Memos. She appeared on my schedule in mid October and I saw her twice. She has not been here however in 3  weeks. She has advanced Wound Care at home where she lives with her husband. She has multiple sclerosis. I saw her the first time she had 4 punched-out small wounds on the anterior lateral left leg it appears that she is now down to 2. She also had wounds on the lateral and medial aspect of her right leg which were also small and punched out. The only one that remains is on the right lateral. Because of the nature of her wound I went ahead and ordered formal arterial studies this showed an ABI of 1 on the right and one on the left. TBI of 1.4 on the right and 0.79 on the left. She had normal waveforms. She was in the emergency room on 02/28/16; there is a noted edema of her left leg after a fall. She had a duplex ultrasound that showed no evidence for an acute DVT from the left groin to the popliteal fossa. The study was limited in the calf veins due to edema. Also noticeable that she had a left inguinal enlarged lymph node up to 6.2 cm. She also had a left knee x-ray that showed no acute findings and a right foot x-ray that showed soft tissue swelling but no radiographic evidence of osteomyelitis. The patient does stop smoking 03-19-16 [name] Lynd presents today for evaluation of her bilateral lower extremity ulcerations. She states that she has not  smoked in several weeks. She denies any pain or discomfort to bilateral lower extremities, tolerating compression therapy as ordered. 03/26/16; I have not seen this patient in 2 weeks however she has done very well with improvements in the areas in question. After we obtain normal arterial studies, increasing the 4-layer compression really seems to look done a nice job here. She has one open area on the left lateral leg and one on the medial right leg. These have filled in nicely and are now superficial wounds that showed epithelialized. I note the left inguinal lymph node at 6.2 cm. I may need to refer this back to the patient's primary doctor. 04/03/16;  patient has 2 remaining wounds 1 on the left lateral leg and one on the right medial leg. Both of filled in nicely since we are able to increase her from a 3 layer to a 4 layer compression. I am also following up on the lymph node notable on her left leg DVT rule out in November 04/10/16; area on the right lateral leg is healed. The area on the left leg is still open but looks considerably better with healthy granulation and less wound area. I12/20/17; last week we healed the patient doubt with regards to the wound on the right leg to her on compression stocking 20-30 mmHg as it turns out I don't think she had a stocking, predictably therefore she has reopened. The left leg as well as the wound on the lateral aspect. Been generally small line 04/24/16; we healed out her right leg 2 weeks ago to her own 20-30 mm compression stockings although as it turns out she didn't have these [name], [name] H. ([id number]) and did not purchase some apparently because of financial issues. The left leg wound is on the lateral aspect. Both of these wounds are just about healed. 05/01/16; her wounds on the left leg are healed out today. The area on the right leg is also healed. Vitamin diligent effort of our staff we have not been able to get any form of compression stocking to this patient. The orders for juxtalite's were given to home health this never materialized. We ordered compression stockings I don't think she was able to afford these. We had a discussion today with both the patient and her husband without these, will accumulate edema and the wounds will simply reopen again. 05/14/16 READMISSION this is a patient we healed out 2 weeks ago. As bilateral lower extremity venous wounds probably some degree of lymphedema. At the beginning in November I did a duplex ultrasound of her left leg that was negative for DVT but did show a lymph node in the left inguinal area presumably reactive. We've been using compression  to her lower extremities eventually healed all her wounds on her bilateral calves but we were not able to get any form of compression stockings for her in spite of intense efforts of our staff. She returns today with reopening on the left leg did not the right she has several superficial areas anteriorly but an actual frank ulcer on the lateral left calf. This is about the size of a dime or smaller. She does not really complain of pain. The patient has a history of PAD however arterial studies we did in November were normal. Her ABIs and Doppler waveforms were both within the normal limits. The patient has a history of [name]. In discussing things with her today it would appear that the opening was noted 2 weeks ago according to the  patient. We gave her Tubigrip stockings when she left the clinic 05/21/16; she has not yet had the duplex ultrasound of her thigh, this is booked for Thursday. The wound on the left lateral lower leg is improved 05/28/16; her duplex ultrasound of the left leg specifically her left thigh did not show a DVT however did show hypoechogenic enlarged left inguinal lymph nodes up to 6.9 x 3.7 x 6.8. This also showed in the one in November however maximal diameter was 6.2, at that point I thought these were reactive secondary to cellulitis however there is obviously a differential here. Her wound on the left lower leg is closed. She has a superficial open area on the base of her left fifth metatarsal head on the plantar foot. It looks as though she has some wrap injuries on the anterior leg 06/05/16; since she was last year she had a fall on 1/31. She was seen in the ER but sent back home. I think she is also been seen by her primary physician who picked up on the swelling and the lymphadenopathy. She is ordered a CT scan of the abdomen and pelvis however this will be without contrast because of stage IIIB renal insufficiency. Nevertheless this should be a start the workup to make sure  there is not is more systemic problem here. She will probably need consideration of a biopsy of the lymph node in her inguinal area, this would need general surgery. As far as her wounds are concern today the area on the left plantar foot is healed. She has a small weeping area on the left lower leg. The wrap injury from last week has largely Pardoxically there is a lot less edema in the left thigh. 06/12/16; CT scan is on Friday. She has 2 small open areas one on the lateral left lower leg and one on the anterior lower leg on the left. Both of these looks healthy. I reduced her compression last week to Kerlix and Coban this seems to have not a reasonable job 07/02/16; the patient is now in the skilled facility. I think this was arranged out of the ER when she fell at home although I'm not exactly sure. She is going for I think a laparotomy for the pelvic mass next week. All the wounds in the left leg have healed Readmission: 07/20/2020 upon evaluation today patient presents for reevaluation here in clinic unfortunately she is having issues with a significant stage III pressure ulcer to her left heel. This is something that occurred when she was in the hospital sick. And subsequently though she does not appear to have any new injury she is still trying to recover from the pressure injury which was quite severe during that time. I do not see any signs of infection right now which is great news. With that being said she has been require some debridement to try to clear this up as much as possible. We need to get the wound VAC cleared so that she can see overall healing and improvement that were looking for. The patient does have a history of hypertension, chronic kidney disease stage III, COPD, and multiple sclerosis. Currently she has been trying to do what she can to keep pressure off of her heel while in bed and in other locations as well. 08/04/20 upon evaluation today patient appears to be doing  extremely well currently in regard to her wound on the heel. I do believe the Iodoflex has helped loosen things appear. Unfortunately she tells  me that the home health nurse has not been putting the Iodoflex on the wound. That is news to [place] and that they tell me they got supplies in the mail but they did not see the Iodoflex being used. Nonetheless I am wondering if this is just something that they missed or if it truly has not been. I did tell them to look when they get home in their box of supplies to see if they saw the dressing which showed and what it should look like. 08/11/2020 upon evaluation today patient appears to be doing decently well in regard to her wound although unfortunately this is showing signs of some blue-green drainage consistent with Pseudomonas it does have a little worried in this regard. Fortunately there is no evidence of active infection which is great news. No fevers, chills, nausea, vomiting, or diarrhea. 08/18/2020 upon evaluation today patient appears to be doing well with regard to her wound. She has been tolerating the dressing changes without complication. Fortunately there is no signs of active infection at this time. No fevers, chills, nausea, vomiting, or diarrhea. 4/29; left heel wound pressure ulcer. Slightly smaller. 09/04/2020 upon evaluation today patient appears to be doing well with regard to her wound. This is actually on her heel and seems to be doing well other than the fact that is a little bit moist from excessive drainage. There does not appear to be any signs of active infection at this time. No fevers, chills, nausea, vomiting, or diarrhea. Electronic Signature(s) Signed: 09/04/2020 2:55:15 PM By: Worthy Keeler PA-C Entered By: Worthy Keeler on 09/04/2020 14:55:15 [name] ([id number]) -------------------------------------------------------------------------------- Physical Exam Details Patient Name: [name] Date of Service:  09/04/2020 2:00 PM Medical Record Number: [id number] Patient Account Number: [id number] Date of Birth/Sex: 1950-11-01 (70 y.o. F) Treating RN: Donnamarie Poag Primary Care Provider: [name] Other Clinician: Referring Provider: [name] Treating Provider/Extender: [name] in Treatment: 6 Constitutional Well-nourished and well-hydrated in no acute distress. Respiratory normal breathing without difficulty. Psychiatric this patient is able to make decisions and demonstrates good insight into disease process. Alert and Oriented x 3. pleasant and cooperative. Notes Upon inspection patient's wound bed showed signs of good granulation epithelization. There was some maceration around the edges of the wound I think we may want to use some zinc to try to help protect this region and the patient is in agreement with that plan. Electronic Signature(s) Signed: 09/04/2020 2:55:30 PM By: Worthy Keeler PA-C Entered By: Worthy Keeler on 09/04/2020 14:55:30 [name] ([id number]) -------------------------------------------------------------------------------- Physician Orders Details Patient Name: [name] Date of Service: 09/04/2020 2:00 PM Medical Record Number: [id number] Patient Account Number: [id number] Date of Birth/Sex: 1950-05-08 (70 y.o. F) Treating RN: Donnamarie Poag Primary Care Provider: [name] Other Clinician: Referring Provider: [name] Treating Provider/Extender: [name] in Treatment: 6 Verbal / Phone Orders: No Diagnosis Coding ICD-10 Coding Code Description [phone] Pressure ulcer of left heel, stage 3 I10 Essential (primary) hypertension N18.30 Chronic kidney disease, stage 3 unspecified J44.9 Chronic obstructive pulmonary disease, unspecified G35 Multiple sclerosis Follow-up Appointments o Return Appointment in 1 week. [place]: - [facility] [place] for wound care.  May utilize formulary equivalent dressing for wound treatment orders unless otherwise specified. Home Health Nurse may visit PRN to address patientos wound care needs. o Scheduled days for dressing changes to be completed; exception, patient has scheduled wound care visit that day. -  Mondays and Thursdays o **Please direct any NON-WOUND related issues/requests for orders to patient's Primary Care Physician. **If current dressing causes regression in wound condition, may D/C ordered dressing product/s and apply Normal Saline Moist Dressing daily until next [place] or Other MD appointment. **Notify Wound Healing Center of regression in wound condition at [phone]. Edema Control - Lymphedema / Segmental Compressive Device / Other Left Lower Extremity o Optional: One layer of unna paste to top of compression wrap (to act as an anchor). o Elevate, Exercise Daily and Avoid Standing for Long Periods of Time. o Elevate legs to the level of the heart and pump ankles as often as possible o Elevate leg(s) parallel to the floor when sitting. Off-Loading Left Lower Extremity o Open toe surgical shoe o Other: - Keep pressure off left heel Wound Treatment Wound #12 - Calcaneus Wound Laterality: Left Cleanser: Normal Saline 2 x Per Week/30 Days Discharge Instructions: Wash your hands with soap and water. Remove old dressing, discard into plastic bag and place into trash. Cleanse the wound with Normal Saline prior to applying a clean dressing using gauze sponges, not tissues or cotton balls. Do not scrub or use excessive force. Pat dry using gauze sponges, not tissue or cotton balls. Peri-Wound Care: Desitin Maximum Strength Ointment 4 (oz) 2 x Per Week/30 Days Discharge Instructions: apply around perimeter of wound for protection Primary Dressing: Hydrofera Blue Ready Transfer Foam, 4x5 (in/in) (Generic) 2 x Per Week/30 Days Discharge Instructions: Apply Hydrofera Blue  Ready to wound bed as directed Secondary Dressing: ABD Pad 5x9 (in/in) 2 x Per Week/30 Days Discharge Instructions: Cover with ABD pad Compression Wrap: Profore Lite LF 3 Multilayer Compression Bandaging System 2 x Per Week/30 Days Discharge Instructions: Apply 3 multi-layer wrap as prescribed. [name], [name] ([id number]) Electronic Signature(s) Signed: 09/04/2020 5:00:28 PM By: Donnamarie Poag Signed: 09/04/2020 6:51:50 PM By: Worthy Keeler PA-C Entered By: Donnamarie Poag on 09/04/2020 14:41:55 [name] ([id number]) -------------------------------------------------------------------------------- Problem List Details Patient Name: [name] Date of Service: 09/04/2020 2:00 PM Medical Record Number: [id number] Patient Account Number: [id number] Date of Birth/Sex: [birth year] (70 y.o. F) Treating RN: Donnamarie Poag Primary Care Provider: [name] Other Clinician: Referring Provider: [name] Treating Provider/Extender: [name] in Treatment: 6 Active Problems ICD-10 Encounter Code Description Active Date MDM Diagnosis [phone] Pressure ulcer of left heel, stage 3 07/20/2020 No Yes I10 Essential (primary) hypertension 07/20/2020 No Yes N18.30 Chronic kidney disease, stage 3 unspecified 07/20/2020 No Yes J44.9 Chronic obstructive pulmonary disease, unspecified 07/20/2020 No Yes G35 Multiple sclerosis 07/20/2020 No Yes Inactive Problems Resolved Problems Electronic Signature(s) Signed: 09/04/2020 2:25:07 PM By: Worthy Keeler PA-C Entered By: Worthy Keeler on 09/04/2020 14:25:06 [name], [name] ([id number]) -------------------------------------------------------------------------------- Progress Note Details Patient Name: [name] Date of Service: 09/04/2020 2:00 PM Medical Record Number: [id number] Patient Account Number: [id number] Date of Birth/Sex: 04-17-1951 (70 y.o. F) Treating RN: Donnamarie Poag Primary Care Provider: [name] Other  Clinician: Referring Provider: [name] Treating Provider/Extender: [name] in Treatment: 6 Subjective Chief Complaint Information obtained from Patient Left Heel Pressure Ulcer History of Present Illness (HPI) The following HPI elements were documented for the patient's wound: Location: left and right lower extremity and dorsum of the right foot Quality: Patient reports experiencing a sharp pain to affected area(s). Severity: Patient rates the pain to be a 8 out of 10 especially with palpation Duration: Patient has had the wound for > 6 months prior to seeking  treatment at the wound center Timing: Pain in wound is Intermittent in severity but persistent Context: The wound appeared gradually over time Modifying Factors: Other treatment(s) tried include: symptomatic treatment as advised by her PCP Associated Signs and Symptoms: Patient reports having increase swelling iin her bilateral lower extremities 70 year old with a past medical history significant for [name], urinary incontinence, and obesity. She has been seen in the wound clinic before for lower extremity ulcerations treated with compression therapy. she is also known to have hypertension, peripheral vascular disease, COPD, obstructive sleep apnea, lumbar radiculopathy, kyphoscoliosis, urinary issues and tobacco abuse. Smokes a packet of cigarettes a day was recently seen at the [facility] for swelling of her legs and feet with a ulceration on the dorsum of the right foot which has been there for about 6 months. she was recently in the ER about a month ago where she was seen for shortness of breath and swelling of the legs and a chest x-ray was within normal limits had an increase in her BNP and was given Lasix and put on doxycycline for a mild cellulitis and possible UTI. Wounds aresmaller today 11/13/15. Debrided and will continue Santyl. 12/21/2015 -- she was admitted to the hospital overnight on  12/18/2015 and diagnosed with urinary retention and cellulitis of the left lower leg. is past to take clindamycin and use Santyl for her wounds. 01/15/2016 -- come back to see [place] for almost a month and continues to be noncompliant with her dressings 01/30/16 patient presents today for a follow-up visit concerning her ongoing bilateral lower extremity wounds. We have not seen her for the past 2 weeks although we are supposed to be seen her for weekly visits. She currently has a Foley catheter. She tells me that the wounds are intensely painful especially with pressure and palpation at this point in time. Fortunately she is having no interval signs or symptoms of systemic infection but unfortunately the wounds have not improved dramatically since we last saw her. She does have home health coming to take care of her as well. She is currently not in any compression wraps. 02/06/16 ON evaluation today patient continues to experience discomfort regarding her bilateral lower extremity ulcers. She has continued to tolerate the dressing changes at this point in time and continues to have a Foley catheter as well. Fortunately she is back this week in the past she has been somewhat noncompliant with follow-ups I'm glad to see her today. She tells me that her pain level varies but can be as high as a 7 out of 10 with manipulation of the wound. She tells me that she used to be on oxycodone which was managed by the pain clinic although she is no longer on that as she tells me that she was actually smoking marijuana at the time and when this was found out they discontinued her pain medication. She no longer is taking anything pain medication wise and she also does not smoke cigarettes nor marijuana at this point. 02/12/16; this is a patient I don't believe I have previously seen. She has multiple sclerosis. She has 4 punched-out areas on the anterior lateral left leg 2 areas on the right these are all in the same  condition. Reasonably small [dime size] wounds each was some depth. Surface of these wounds does not look particularly healthy as there is adherent slough. There is no evidence of surrounding infection or inflammation. ABIs in this clinic were 0.87 on the right and 0.81 on the  left. She is listed as having PAD and is a smoker. Not a diabetic 02/20/16 patient I gave antibiotics to last week for erythema around both wound sites on the left lateral leg and right lateral leg. This appears to be a lot better. One of the areas on the left leg is healed however she still has 3 punched-out open areas on the left lateral calf and one on the right lateral calf and one on the dorsal foot. She is an ex-smoker quitting 1 month ago. ABIs in this clinic were 0.87 on the right 0.81 on the left 03/12/16; this is a patient I really don't have a good sense of. It would appear for the first 5 or 6 weeks of her stay in this clinic she was cared for by Dr. Con Memos. She appeared on my schedule in mid October and I saw her twice. She has not been here however in 3 weeks. She has advanced Wound Care at home where she lives with her husband. She has multiple sclerosis. I saw her the first time she had 4 punched-out small wounds on the anterior lateral left leg it appears that she is now down to 2. She also had wounds on the lateral and medial aspect of her right leg which were also small and punched out. The only one that remains is on the right lateral. Because of the nature of her wound I went ahead and ordered formal arterial studies this showed an ABI of 1 on the right and one on the left. TBI of 1.4 on the right and 0.79 on the left. She had normal waveforms. She was in the emergency room on 02/28/16; there is a noted edema of her left leg after a fall. She had a duplex ultrasound that showed no evidence for an acute DVT from the left groin to the popliteal fossa. The study was limited in the calf veins due to edema.  Also noticeable that she had a left inguinal enlarged lymph node up to 6.2 cm. She also had a left knee x-ray that showed no acute findings and a right foot x-ray that showed soft tissue swelling but no radiographic evidence of osteomyelitis. The patient does stop smoking 03-19-16 [name] presents today for evaluation of her bilateral lower extremity ulcerations. She states that she has not smoked in several weeks. She denies any pain or discomfort to bilateral lower extremities, tolerating compression therapy as ordered. 03/26/16; I have not seen this patient in 2 weeks however she has done very well with improvements in the areas in question. After we obtain normal arterial studies, increasing the 4-layer compression really seems to look done a nice job here. She has one open area on the left lateral leg and one on the medial right leg. These have filled in nicely and are now superficial wounds that showed epithelialized. I note the left inguinal lymph node at 6.2 cm. I may need to refer this back to the patient's primary doctor. 04/03/16; patient has 2 remaining wounds 1 on the left lateral leg and one on the right medial leg. Both of filled in nicely since we are able to increase her from a 3 layer to a 4 layer compression. I am also following up on the lymph node notable on her left leg DVT rule out in November [name], [name]. ([id number]) 04/10/16; area on the right lateral leg is healed. The area on the left leg is still open but looks considerably better with healthy granulation and  less wound area. I12/20/17; last week we healed the patient doubt with regards to the wound on the right leg to her on compression stocking 20-30 mmHg as it turns out I don't think she had a stocking, predictably therefore she has reopened. The left leg as well as the wound on the lateral aspect. Been generally small line 04/24/16; we healed out her right leg 2 weeks ago to her own 20-30 mm compression stockings  although as it turns out she didn't have these and did not purchase some apparently because of financial issues. The left leg wound is on the lateral aspect. Both of these wounds are just about healed. 05/01/16; her wounds on the left leg are healed out today. The area on the right leg is also healed. Vitamin diligent effort of our staff we have not been able to get any form of compression stocking to this patient. The orders for juxtalite's were given to home health this never materialized. We ordered compression stockings I don't think she was able to afford these. We had a discussion today with both the patient and her husband without these, will accumulate edema and the wounds will simply reopen again. 05/14/16 READMISSION this is a patient we healed out 2 weeks ago. As bilateral lower extremity venous wounds probably some degree of lymphedema. At the beginning in November I did a duplex ultrasound of her left leg that was negative for DVT but did show a lymph node in the left inguinal area presumably reactive. We've been using compression to her lower extremities eventually healed all her wounds on her bilateral calves but we were not able to get any form of compression stockings for her in spite of intense efforts of our staff. She returns today with reopening on the left leg did not the right she has several superficial areas anteriorly but an actual frank ulcer on the lateral left calf. This is about the size of a dime or smaller. She does not really complain of pain. The patient has a history of PAD however arterial studies we did in November were normal. Her ABIs and Doppler waveforms were both within the normal limits. The patient has a history of [name]. In discussing things with her today it would appear that the opening was noted 2 weeks ago according to the patient. We gave her Tubigrip stockings when she left the clinic 05/21/16; she has not yet had the duplex ultrasound of her thigh, this is  booked for Thursday. The wound on the left lateral lower leg is improved 05/28/16; her duplex ultrasound of the left leg specifically her left thigh did not show a DVT however did show hypoechogenic enlarged left inguinal lymph nodes up to 6.9 x 3.7 x 6.8. This also showed in the one in November however maximal diameter was 6.2, at that point I thought these were reactive secondary to cellulitis however there is obviously a differential here. Her wound on the left lower leg is closed. She has a superficial open area on the base of her left fifth metatarsal head on the plantar foot. It looks as though she has some wrap injuries on the anterior leg 06/05/16; since she was last year she had a fall on 1/31. She was seen in the ER but sent back home. I think she is also been seen by her primary physician who picked up on the swelling and the lymphadenopathy. She is ordered a CT scan of the abdomen and pelvis however this will be without contrast  because of stage IIIB renal insufficiency. Nevertheless this should be a start the workup to make sure there is not is more systemic problem here. She will probably need consideration of a biopsy of the lymph node in her inguinal area, this would need general surgery. As far as her wounds are concern today the area on the left plantar foot is healed. She has a small weeping area on the left lower leg. The wrap injury from last week has largely Pardoxically there is a lot less edema in the left thigh. 06/12/16; CT scan is on Friday. She has 2 small open areas one on the lateral left lower leg and one on the anterior lower leg on the left. Both of these looks healthy. I reduced her compression last week to Kerlix and Coban this seems to have not a reasonable job 07/02/16; the patient is now in the skilled facility. I think this was arranged out of the ER when she fell at home although I'm not exactly sure. She is going for I think a laparotomy for the pelvic mass next week.  All the wounds in the left leg have healed Readmission: 07/20/2020 upon evaluation today patient presents for reevaluation here in clinic unfortunately she is having issues with a significant stage III pressure ulcer to her left heel. This is something that occurred when she was in the hospital sick. And subsequently though she does not appear to have any new injury she is still trying to recover from the pressure injury which was quite severe during that time. I do not see any signs of infection right now which is great news. With that being said she has been require some debridement to try to clear this up as much as possible. We need to get the wound VAC cleared so that she can see overall healing and improvement that were looking for. The patient does have a history of hypertension, chronic kidney disease stage III, COPD, and multiple sclerosis. Currently she has been trying to do what she can to keep pressure off of her heel while in bed and in other locations as well. 08/04/20 upon evaluation today patient appears to be doing extremely well currently in regard to her wound on the heel. I do believe the Iodoflex has helped loosen things appear. Unfortunately she tells me that the home health nurse has not been putting the Iodoflex on the wound. That is news to [place] and that they tell me they got supplies in the mail but they did not see the Iodoflex being used. Nonetheless I am wondering if this is just something that they missed or if it truly has not been. I did tell them to look when they get home in their box of supplies to see if they saw the dressing which showed and what it should look like. 08/11/2020 upon evaluation today patient appears to be doing decently well in regard to her wound although unfortunately this is showing signs of some blue-green drainage consistent with Pseudomonas it does have a little worried in this regard. Fortunately there is no evidence of active infection which is  great news. No fevers, chills, nausea, vomiting, or diarrhea. 08/18/2020 upon evaluation today patient appears to be doing well with regard to her wound. She has been tolerating the dressing changes without complication. Fortunately there is no signs of active infection at this time. No fevers, chills, nausea, vomiting, or diarrhea. 4/29; left heel wound pressure ulcer. Slightly smaller. 09/04/2020 upon evaluation today patient appears  to be doing well with regard to her wound. This is actually on her heel and seems to be doing well other than the fact that is a little bit moist from excessive drainage. There does not appear to be any signs of active infection at this time. No fevers, chills, nausea, vomiting, or diarrhea. Objective Constitutional Well-nourished and well-hydrated in no acute distress. [name], [name] ([id number]) Vitals Time Taken: 2:25 PM, Height: 64 in, Weight: 265 lbs, BMI: 45.5, Temperature: 98.4 F, Pulse: 77 bpm, Respiratory Rate: 18 breaths/min, Blood Pressure: 116/61 mmHg. Respiratory normal breathing without difficulty. Psychiatric this patient is able to make decisions and demonstrates good insight into disease process. Alert and Oriented x 3. pleasant and cooperative. General Notes: Upon inspection patient's wound bed showed signs of good granulation epithelization. There was some maceration around the edges of the wound I think we may want to use some zinc to try to help protect this region and the patient is in agreement with that plan. Integumentary (Hair, Skin) Wound #12 status is Open. Original cause of wound was Pressure Injury. The date acquired was: 06/27/2020. The wound has been in treatment 6 weeks. The wound is located on the Left Calcaneus. The wound measures 1.6cm length x 2.3cm width x 0.2cm depth; 2.89cm^2 area and 0.578cm^3 volume. There is Fat Layer (Subcutaneous Tissue) exposed. There is no tunneling or undermining noted. There is a medium amount of  serosanguineous drainage noted. The wound margin is thickened. There is medium (34-66%) pink, hyper - granulation within the wound bed. There is a medium (34-66%) amount of necrotic tissue within the wound bed including Adherent Slough. Assessment Active Problems ICD-10 Pressure ulcer of left heel, stage 3 Essential (primary) hypertension Chronic kidney disease, stage 3 unspecified Chronic obstructive pulmonary disease, unspecified Multiple sclerosis Procedures Wound #12 Pre-procedure diagnosis of Wound #12 is a Pressure Ulcer located on the Left Calcaneus . There was a Three Layer Compression Therapy Procedure by Donnamarie Poag, RN. Post procedure Diagnosis Wound #12: Same as Pre-Procedure Plan Follow-up Appointments: Return Appointment in 1 week. Home Health: [facility] for wound care. May utilize formulary equivalent dressing for wound treatment orders unless otherwise specified. Home Health Nurse may visit PRN to address patient s wound care needs. Scheduled days for dressing changes to be completed; exception, patient has scheduled wound care visit that day. - Mondays and Thursdays **Please direct any NON-WOUND related issues/requests for orders to patient's Primary Care Physician. **If current dressing causes regression in wound condition, may D/C ordered dressing product/s and apply Normal Saline Moist Dressing daily until next [place] or Other MD appointment. **Notify [facility] of regression in wound condition at [phone]. Edema Control - Lymphedema / Segmental Compressive Device / Other: Optional: One layer of unna paste to top of compression wrap (to act as an anchor). Elevate, Exercise Daily and Avoid Standing for Long Periods of Time. Elevate legs to the level of the heart and pump ankles as often as possible Elevate leg(s) parallel to the floor when sitting. Off-Loading: Open toe surgical shoe Other: -  Keep pressure off left heel WOUND #12: - Calcaneus Wound Laterality: Left Cleanser: Normal Saline 2 x Per Week/30 Days [name], [name] ([id number]) Discharge Instructions: Wash your hands with soap and water. Remove old dressing, discard into plastic bag and place into trash. Cleanse the wound with Normal Saline prior to applying a clean dressing using gauze sponges, not tissues or cotton balls. Do not  scrub or use excessive force. Pat dry using gauze sponges, not tissue or cotton balls. Peri-Wound Care: Desitin Maximum Strength Ointment 4 (oz) 2 x Per Week/30 Days Discharge Instructions: apply around perimeter of wound for protection Primary Dressing: Hydrofera Blue Ready Transfer Foam, 4x5 (in/in) (Generic) 2 x Per Week/30 Days Discharge Instructions: Apply Hydrofera Blue Ready to wound bed as directed Secondary Dressing: ABD Pad 5x9 (in/in) 2 x Per Week/30 Days Discharge Instructions: Cover with ABD pad Compression Wrap: Profore Lite LF 3 Multilayer Compression Bandaging System 2 x Per Week/30 Days Discharge Instructions: Apply 3 multi-layer wrap as prescribed. 1. Would recommend currently that we going to continue with the wound care measures as before and the patient is in agreement with the plan that includes the use of the [facility] dressing which I think is doing quite well. 2. I am also can recommend ABD pad to cover which is also seem to do well at this time. 3. I am also can recommend that the patient continue to monitor for any signs of worsening infection. No fevers, chills, nausea, vomiting, or diarrhea. We will see patient back for reevaluation in 1 week here in the clinic. If anything worsens or changes patient will contact our office for additional recommendations. Electronic Signature(s) Signed: 09/04/2020 2:56:46 PM By: Worthy Keeler PA-C Entered By: Worthy Keeler on 09/04/2020 14:56:46 [name]  ([id number]) -------------------------------------------------------------------------------- SuperBill Details Patient Name: [name] Date of Service: 09/04/2020 Medical Record Number: [id number] Patient Account Number: [id number] Date of Birth/Sex: Dec 15, 1950 (70 y.o. F) Treating RN: Donnamarie Poag Primary Care Provider: [name] Other Clinician: Referring Provider: [name] Treating Provider/Extender: [name] in Treatment: 6 Diagnosis Coding ICD-10 Codes Code Description [phone] Pressure ulcer of left heel, stage 3 I10 Essential (primary) hypertension N18.30 Chronic kidney disease, stage 3 unspecified J44.9 Chronic obstructive pulmonary disease, unspecified G35 Multiple sclerosis Facility Procedures CPT4 Code: [id number] Description: (Facility Use Only) [id number] - [place] LWR LT LEG Modifier: Quantity: 1 Physician Procedures CPT4 Code: [id number] Description: [id number] - WC PHYS LEVEL 3 - EST PT Modifier: Quantity: 1 CPT4 Code: Description: ICD-10 Diagnosis Description L89.623 Pressure ulcer of left heel, stage 3 I10 Essential (primary) hypertension N18.30 Chronic kidney disease, stage 3 unspecified J44.9 Chronic obstructive pulmonary disease, unspecified Modifier: Quantity: Electronic Signature(s) Signed: 09/04/2020 2:57:36 PM By: Worthy Keeler PA-C Entered By: Worthy Keeler on 09/04/2020 14:57:36

## 2020-09-05 NOTE — Progress Notes (Signed)
[facility], [facility] ([id number]) Visit Report for 09/04/2020 Arrival Information Details Patient Name: [name], [name] Date of Service: 09/04/2020 2:00 PM Medical Record Number: [id number] Patient Account Number: [id number] Date of Birth/Sex: 1950-11-12 (69 y.o. F) Treating RN: Cornell Barman Primary Care Joley Utecht: [name] Other Clinician: Referring Reshma Hoey: [name] Treating Alix Stowers/Extender: [name] in Treatment: 6 Visit Information History Since Last Visit Has Dressing in Place as Prescribed: Yes Patient Arrived: Wheel Chair Pain Present Now: Yes Arrival Time: 14:16 Accompanied By: husband Transfer Assistance: None Patient Identification Verified: Yes Secondary Verification Process Completed: Yes Patient Has Alerts: Yes Electronic Signature(s) Signed: 09/05/2020 10:18:25 AM By: Gretta Cool, BSN, RN, CWS, Kim RN, BSN Entered By: Gretta Cool, BSN, RN, CWS, Kim on 09/04/2020 14:17:23 [name] ([id number]) -------------------------------------------------------------------------------- Clinic Level of Care Assessment Details Patient Name: [name] Date of Service: 09/04/2020 2:00 PM Medical Record Number: [id number] Patient Account Number: [id number] Date of Birth/Sex: 07/05/1950 (69 y.o. F) Treating RN: Donnamarie Poag Primary Care Tsering Leaman: [name] Other Clinician: Referring Soila Printup: [name] Treating Brieonna Crutcher/Extender: [name] in Treatment: 6 Clinic Level of Care Assessment Items TOOL 1 Quantity Score _0  - Use when EandM and Procedure is performed on INITIAL visit 0 ASSESSMENTS - Nursing Assessment / Reassessment _1  - General Physical Exam (combine w/ comprehensive assessment (listed just below) when performed on new 0 pt. evals) _2  - 0 Comprehensive Assessment (HX, ROS, Risk Assessments, Wounds Hx, etc.) ASSESSMENTS - Wound and Skin Assessment / Reassessment _3  - Dermatologic / Skin Assessment (not related to wound area)  0 ASSESSMENTS - Ostomy and/or Continence Assessment and Care _4  - Incontinence Assessment and Management 0 _5  - 0 Ostomy Care Assessment and Management (repouching, etc.) PROCESS - Coordination of Care _6  - Simple Patient / Family Education for ongoing care 0 _7  - 0 Complex (extensive) Patient / Family Education for ongoing care _8  - 0 Staff obtains Programmer, systems, Records, Test Results / Process Orders _9  - 0 Staff telephones HHA, Nursing Homes / Clarify orders / etc _10  - 0 Routine Transfer to another Facility (non-emergent condition) _11  - 0 Routine Hospital Admission (non-emergent condition) _12  - 0 New Admissions / Biomedical engineer / Ordering NPWT, Apligraf, etc. _13  - 0 Emergency Hospital Admission (emergent condition) PROCESS - Special Needs _14  - Pediatric / Minor Patient Management 0 _15  - 0 Isolation Patient Management _16  - 0 Hearing / Language / Visual special needs _17  - 0 Assessment of Community assistance (transportation, D/C planning, etc.) _18  - 0 Additional assistance / Altered mentation _19  - 0 Support Surface(s) Assessment (bed, cushion, seat, etc.) INTERVENTIONS - Miscellaneous _20  - External ear exam 0 _21  - 0 Patient Transfer (multiple staff / Civil Service fast streamer / Similar devices) _22  - 0 Simple Staple / Suture removal (25 or less) _23  - 0 Complex Staple / Suture removal (26 or more) _24  - 0 Hypo/Hyperglycemic Management (do not check if billed separately) _25  - 0 Ankle / Brachial Index (ABI) - do not check if billed separately Has the patient been seen at the hospital within the last three years: Yes Total Score: 0 Level Of Care: ____ [name] ([id number]) Electronic Signature(s) Signed: 09/04/2020 5:00:28 PM By: Donnamarie Poag Entered By: Donnamarie Poag on 09/04/2020 14:42:21 Piccini, [name] ([id number]) -------------------------------------------------------------------------------- Compression Therapy Details Patient Name: [name] Date of Service:  09/04/2020 2:00 PM Medical Record Number: [id number] Patient Account Number: [id number] Date of Birth/Sex: 14-Nov-1950 (69 y.o. F) Treating RN: Donnamarie Poag Primary Care Analee Montee: [name] Other Clinician: Referring  Korinne Greenstein: [name] Treating Micaylah Bertucci/Extender: [name] in Treatment: 6 Compression Therapy Performed for Wound Assessment: Wound #12 Left Calcaneus Performed By: Clinician Donnamarie Poag, RN Compression Type: Three Layer Post Procedure Diagnosis Same as Pre-procedure Electronic Signature(s) Signed: 09/04/2020 5:00:28 PM By: Donnamarie Poag Entered By: Donnamarie Poag on 09/04/2020 14:39:27 [name] ([id number]) -------------------------------------------------------------------------------- Encounter Discharge Information Details Patient Name: [name] Date of Service: 09/04/2020 2:00 PM Medical Record Number: [id number] Patient Account Number: [id number] Date of Birth/Sex: 05-06-1950 (69 y.o. F) Treating RN: [name] Primary Care Teigen Bellin: [name] Other Clinician: Referring Donie Lemelin: [name] Treating Mayra Jolliffe/Extender: [name] in Treatment: 6 Encounter Discharge Information Items Discharge Condition: Stable Ambulatory Status: Wheelchair Discharge Destination: Home Transportation: Private Auto Accompanied By: husband Schedule Follow-up Appointment: Yes Clinical Summary of Care: Electronic Signature(s) Signed: 09/04/2020 5:15:51 PM By: Georges Mouse, Minus Breeding RN Entered By: Georges Mouse, Minus Breeding on 09/04/2020 14:56:53 [name] ([id number]) -------------------------------------------------------------------------------- Lower Extremity Assessment Details Patient Name: [name] Date of Service: 09/04/2020 2:00 PM Medical Record Number: [id number] Patient Account Number: [id number] Date of Birth/Sex: May 14, 1950 (69 y.o. F) Treating RN: Cornell Barman Primary Care Amad Mau: [name] Other  Clinician: Referring Blaire Hodsdon: [name] Treating Liset Mcmonigle/Extender: [name] in Treatment: 6 Edema Assessment Assessed: [Left: No] [Right: No] [Left: Edema] [Right: :] Calf Left: Right: Point of Measurement: 36 cm From Medial Instep 34.5 cm Ankle Left: Right: Point of Measurement: 10 cm From Medial Instep 21.2 cm Vascular Assessment Pulses: Dorsalis Pedis Palpable: [Left:Yes] Electronic Signature(s) Signed: 09/05/2020 10:18:25 AM By: Gretta Cool, BSN, RN, CWS, Kim RN, BSN Entered By: Gretta Cool, BSN, RN, CWS, Kim on 09/04/2020 14:32:50 [name] ([id number]) -------------------------------------------------------------------------------- Multi Wound Chart Details Patient Name: [name] Date of Service: 09/04/2020 2:00 PM Medical Record Number: [id number] Patient Account Number: [id number] Date of Birth/Sex: 15-Sep-1950 (69 y.o. F) Treating RN: Donnamarie Poag Primary Care Andraya Frigon: [name] Other Clinician: Referring Kathelyn Gombos: [name] Treating Captola Teschner/Extender: [name] in Treatment: 6 Vital Signs Height(in): 2 Pulse(bpm): 47 Weight(lbs): 265 Blood Pressure(mmHg): 116/61 Body Mass Index(BMI): 45 Temperature(F): 98.4 Respiratory Rate(breaths/min): 18 Photos: [N/A:N/A] Wound Location: Left Calcaneus N/A N/A Wounding Event: Pressure Injury N/A N/A Primary Etiology: Pressure Ulcer N/A N/A Comorbid History: Cataracts, Chronic Obstructive N/A N/A Pulmonary Disease (COPD), Sleep Apnea, Hypertension, Osteoarthritis, Neuropathy Date Acquired: 06/27/2020 N/A N/A Weeks of Treatment: 6 N/A N/A Wound Status: Open N/A N/A Measurements L x W x D (cm) 1.6x2.3x0.2 N/A N/A Area (cm) : 2.89 N/A N/A Volume (cm) : 0.578 N/A N/A % Reduction in Area: 43.40% N/A N/A % Reduction in Volume: 43.40% N/A N/A Classification: Category/Stage III N/A N/A Exudate Amount: Medium N/A N/A Exudate Type: Serosanguineous N/A N/A Exudate Color: red, brown  N/A N/A Wound Margin: Thickened N/A N/A Granulation Amount: Medium (34-66%) N/A N/A Granulation Quality: Pink, Hyper-granulation N/A N/A Necrotic Amount: Medium (34-66%) N/A N/A Exposed Structures: Fat Layer (Subcutaneous Tissue): N/A N/A Yes Fascia: No Tendon: No Muscle: No Joint: No Bone: No Epithelialization: None N/A N/A Treatment Notes Electronic Signature(s) Signed: 09/04/2020 5:00:28 PM By: Donnamarie Poag Entered By: Donnamarie Poag on 09/04/2020 14:39:06 [name] ([id number]) -------------------------------------------------------------------------------- Multi-Disciplinary Care Plan Details Patient Name: [name] Date of Service: 09/04/2020 2:00 PM Medical Record Number: [id number] Patient Account Number: [id number] Date of Birth/Sex: 04/10/1951 (69 y.o. F) Treating RN: Donnamarie Poag Primary Care Christyan Reger: [name] Other Clinician: Referring Audianna Landgren: [name] Treating Bolivar Koranda/Extender: [name] in Treatment: 6 Active Inactive Wound/Skin Impairment Nursing Diagnoses: Impaired  tissue integrity Goals: Patient/caregiver will verbalize understanding of skin care regimen Date Initiated: 07/20/2020 Date Inactivated: 09/04/2020 Target Resolution Date: 09/19/2020 Goal Status: Met Ulcer/skin breakdown will have a volume reduction of 30% by week 4 Date Initiated: 07/20/2020 Date Inactivated: 08/25/2020 Target Resolution Date: 08/20/2020 Goal Status: Met Ulcer/skin breakdown will have a volume reduction of 50% by week 8 Date Initiated: 07/20/2020 Target Resolution Date: 09/19/2020 Goal Status: Active Ulcer/skin breakdown will have a volume reduction of 80% by week 12 Date Initiated: 07/20/2020 Target Resolution Date: 10/20/2020 Goal Status: Active Ulcer/skin breakdown will heal within 14 weeks Date Initiated: 07/20/2020 Target Resolution Date: 11/19/2020 Goal Status: Active Interventions: Assess patient/caregiver ability to obtain necessary  supplies Assess patient/caregiver ability to perform ulcer/skin care regimen upon admission and as needed Provide education on ulcer and skin care Treatment Activities: Referred to DME Marlise Fahr for dressing supplies : 07/20/2020 Skin care regimen initiated : 07/20/2020 Notes: Electronic Signature(s) Signed: 09/04/2020 5:00:28 PM By: Donnamarie Poag Entered By: Donnamarie Poag on 09/04/2020 14:38:55 Haugen, [name] ([id number]) -------------------------------------------------------------------------------- Pain Assessment Details Patient Name: [name] Date of Service: 09/04/2020 2:00 PM Medical Record Number: [id number] Patient Account Number: [id number] Date of Birth/Sex: Dec 04, 1950 (69 y.o. F) Treating RN: Cornell Barman Primary Care Phebe Dettmer: [name] Other Clinician: Referring Aubrii Sharpless: [name] Treating Johnette Teigen/Extender: [name] in Treatment: 6 Active Problems Location of Pain Severity and Description of Pain Patient Has Paino Yes Site Locations Pain Location: Pain in Ulcers Duration of the Pain. Constant / Intermittento Intermittent Rate the pain. Current Pain Level: 3 Character of Pain Describe the Pain: Tender Pain Management and Medication Current Pain Management: Electronic Signature(s) Signed: 09/05/2020 10:18:25 AM By: Gretta Cool, BSN, RN, CWS, Kim RN, BSN Entered By: Gretta Cool, BSN, RN, CWS, Kim on 09/04/2020 14:17:49 [name] ([id number]) -------------------------------------------------------------------------------- Patient/Caregiver Education Details Patient Name: [name] Date of Service: 09/04/2020 2:00 PM Medical Record Number: [id number] Patient Account Number: [id number] Date of Birth/Gender: [birth year] (69 y.o. F) Treating RN: Donnamarie Poag Primary Care Physician: [name] Other Clinician: Referring Physician: [name] Treating Physician/Extender: [name] in Treatment: 6 Education  Assessment Education Provided To: Patient Education Topics Provided Offloading: Methods: Explain/Verbal Responses: State content correctly Wound/Skin Impairment: Electronic Signature(s) Signed: 09/04/2020 5:00:28 PM By: Donnamarie Poag Entered By: Donnamarie Poag on 09/04/2020 14:42:47 Lohnes, [name] ([id number]) -------------------------------------------------------------------------------- Wound Assessment Details Patient Name: [name] Date of Service: 09/04/2020 2:00 PM Medical Record Number: [id number] Patient Account Number: [id number] Date of Birth/Sex: 1950/08/05 (69 y.o. F) Treating RN: Cornell Barman Primary Care Nallely Yost: [name] Other Clinician: Referring Kelseigh Diver: [name] Treating Costas Sena/Extender: [name] in Treatment: 6 Wound Status Wound Number: 12 Primary Pressure Ulcer Etiology: Wound Location: Left Calcaneus Wound Open Wounding Event: Pressure Injury Status: Date Acquired: 06/27/2020 Comorbid Cataracts, Chronic Obstructive Pulmonary Disease Weeks Of Treatment: 6 History: (COPD), Sleep Apnea, Hypertension, Osteoarthritis, Clustered Wound: No Neuropathy Photos Wound Measurements Length: (cm) 1.6 Width: (cm) 2.3 Depth: (cm) 0.2 Area: (cm) 2.89 Volume: (cm) 0.578 % Reduction in Area: 43.4% % Reduction in Volume: 43.4% Epithelialization: None Tunneling: No Undermining: No Wound Description Classification: Category/Stage III Wound Margin: Thickened Exudate Amount: Medium Exudate Type: Serosanguineous Exudate Color: red, brown Foul Odor After Cleansing: No Slough/Fibrino Yes Wound Bed Granulation Amount: Medium (34-66%) Exposed Structure Granulation Quality: Pink, Hyper-granulation Fascia Exposed: No Necrotic Amount: Medium (34-66%) Fat Layer (Subcutaneous Tissue) Exposed: Yes Necrotic Quality: Adherent Slough Tendon Exposed: No Muscle Exposed: No Joint Exposed: No Bone Exposed: No Treatment Notes Wound #12  (  Calcaneus) Wound Laterality: Left Cleanser Normal Saline Discharge Instruction: Wash your hands with soap and water. Remove old dressing, discard into plastic bag and place into trash. Cleanse the wound with Normal Saline prior to applying a clean dressing using gauze sponges, not tissues or cotton balls. Do not [place], Floretta H. ([id number]) scrub or use excessive force. Pat dry using gauze sponges, not tissue or cotton balls. Peri-Wound Care Desitin Maximum Strength Ointment 4 (oz) Discharge Instruction: apply around perimeter of wound for protection Topical Primary Dressing Hydrofera Blue Ready Transfer Foam, 4x5 (in/in) Discharge Instruction: Apply Hydrofera Blue Ready to wound bed as directed Secondary Dressing ABD Pad 5x9 (in/in) Discharge Instruction: Cover with ABD pad Secured With Compression Wrap Profore Lite LF 3 Multilayer Compression [place] Discharge Instruction: Apply 3 multi-layer wrap as prescribed. Compression Stockings Environmental education officer) Signed: 09/05/2020 10:18:25 AM By: Gretta Cool, BSN, RN, CWS, Kim RN, BSN Entered By: Gretta Cool, BSN, RN, CWS, Kim on 09/04/2020 14:26:43 [name] ([id number]) -------------------------------------------------------------------------------- Vitals Details Patient Name: [name] Date of Service: 09/04/2020 2:00 PM Medical Record Number: [id number] Patient Account Number: [id number] Date of Birth/Sex: 21-Dec-1950 (69 y.o. F) Treating RN: Cornell Barman Primary Care Travez Stancil: [name] Other Clinician: Referring Taysen Bushart: [name] Treating Diavian Furgason/Extender: [name] in Treatment: 6 Vital Signs Time Taken: 14:25 Temperature (F): 98.4 Height (in): 64 Pulse (bpm): 77 Weight (lbs): 265 Respiratory Rate (breaths/min): 18 Body Mass Index (BMI): 45.5 Blood Pressure (mmHg): 116/61 Reference Range: 80 - 120 mg / dl Electronic Signature(s) Signed: 09/05/2020 10:18:25 AM By: Gretta Cool, BSN,  RN, CWS, Kim RN, BSN Entered By: Gretta Cool, BSN, RN, CWS, Kim on 09/04/2020 14:35:02

## 2020-09-07 ENCOUNTER — Other Ambulatory Visit: Payer: Self-pay | Admitting: Urology

## 2020-09-11 ENCOUNTER — Other Ambulatory Visit: Payer: Self-pay

## 2020-09-11 ENCOUNTER — Encounter: Payer: Medicare Other | Admitting: Physician Assistant

## 2020-09-11 DIAGNOSIS — L89623 Pressure ulcer of left heel, stage 3: Secondary | ICD-10-CM | POA: Diagnosis not present

## 2020-09-11 NOTE — Progress Notes (Addendum)
[name], [name] ([id number]) Visit Report for 09/11/2020 Arrival Information Details Patient Name: [name], [name] Date of Service: 09/11/2020 3:00 PM Medical Record Number: [id number] Patient Account Number: [id number] Date of Birth/Sex: 1950-05-09 (69 y.o. F) Treating RN: Carlene Coria Primary Care Kalynne Womac: [name] Other Clinician: Referring Brittyn Salaz: [name] Treating Qiara Minetti/Extender: [name] in Treatment: 7 Visit Information History Since Last Visit All ordered tests and consults were completed: No Patient Arrived: Wheel Chair Added or deleted any medications: No Arrival Time: 15:52 Any new allergies or adverse reactions: No Accompanied By: husband Had a fall or experienced change in No Transfer Assistance: None activities of daily living that may affect Patient Identification Verified: Yes risk of falls: Patient Requires Transmission-Based Precautions: No Signs or symptoms of abuse/neglect since last visito No Patient Has Alerts: Yes Hospitalized since last visit: No Implantable device outside of the clinic excluding No cellular tissue based products placed in the center since last visit: Has Dressing in Place as Prescribed: Yes Has Compression in Place as Prescribed: Yes Pain Present Now: No Electronic Signature(s) Signed: 09/11/2020 8:20:21 PM By: Carlene Coria RN Entered By: Carlene Coria on 09/11/2020 16:00:17 [name] ([id number]) -------------------------------------------------------------------------------- Clinic Level of Care Assessment Details Patient Name: [name] Date of Service: 09/11/2020 3:00 PM Medical Record Number: [id number] Patient Account Number: [id number] Date of Birth/Sex: [birth year] (69 y.o. F) Treating RN: Donnamarie Poag Primary Care Gurnoor Ursua: [name] Other Clinician: Referring Day Greb: [name] Treating Rolen Conger/Extender: [name] in Treatment: 7 Clinic Level of Care  Assessment Items TOOL 1 Quantity Score '[]'  - Use when EandM and Procedure is performed on INITIAL visit 0 ASSESSMENTS - Nursing Assessment / Reassessment '[]'  - General Physical Exam (combine w/ comprehensive assessment (listed just below) when performed on new 0 pt. evals) '[]'  - 0 Comprehensive Assessment (HX, ROS, Risk Assessments, Wounds Hx, etc.) ASSESSMENTS - Wound and Skin Assessment / Reassessment '[]'  - Dermatologic / Skin Assessment (not related to wound area) 0 ASSESSMENTS - Ostomy and/or Continence Assessment and Care '[]'  - Incontinence Assessment and Management 0 '[]'  - 0 Ostomy Care Assessment and Management (repouching, etc.) PROCESS - Coordination of Care '[]'  - Simple Patient / Family Education for ongoing care 0 '[]'  - 0 Complex (extensive) Patient / Family Education for ongoing care '[]'  - 0 Staff obtains Programmer, systems, Records, Test Results / Process Orders '[]'  - 0 Staff telephones HHA, Nursing Homes / Clarify orders / etc '[]'  - 0 Routine Transfer to another Facility (non-emergent condition) '[]'  - 0 Routine Hospital Admission (non-emergent condition) '[]'  - 0 New Admissions / Biomedical engineer / Ordering NPWT, Apligraf, etc. '[]'  - 0 Emergency Hospital Admission (emergent condition) PROCESS - Special Needs '[]'  - Pediatric / Minor Patient Management 0 '[]'  - 0 Isolation Patient Management '[]'  - 0 Hearing / Language / Visual special needs '[]'  - 0 Assessment of Community assistance (transportation, D/C planning, etc.) '[]'  - 0 Additional assistance / Altered mentation '[]'  - 0 Support Surface(s) Assessment (bed, cushion, seat, etc.) INTERVENTIONS - Miscellaneous '[]'  - External ear exam 0 '[]'  - 0 Patient Transfer (multiple staff / Civil Service fast streamer / Similar devices) '[]'  - 0 Simple Staple / Suture removal (25 or less) '[]'  - 0 Complex Staple / Suture removal (26 or more) '[]'  - 0 Hypo/Hyperglycemic Management (do not check if billed separately) '[]'  - 0 Ankle / Brachial Index (ABI) - do not  check if billed separately Has the patient been seen at the hospital within the last three years: Yes Total  Score: 0 Level Of Care: ____ [name] ([id number]) Electronic Signature(s) Signed: 09/12/2020 2:35:55 PM By: Donnamarie Poag Entered By: Donnamarie Poag on 09/11/2020 16:24:42 Dechaine, [name] ([id number]) -------------------------------------------------------------------------------- Compression Therapy Details Patient Name: [name] Date of Service: 09/11/2020 3:00 PM Medical Record Number: [id number] Patient Account Number: [id number] Date of Birth/Sex: 1951-04-27 (69 y.o. F) Treating RN: Donnamarie Poag Primary Care Giada Schoppe: [name] Other Clinician: Referring Samanthajo Payano: [name] Treating Andjela Wickes/Extender: [name] in Treatment: 7 Compression Therapy Performed for Wound Assessment: Wound #12 Left Calcaneus Performed By: Clinician Donnamarie Poag, RN Compression Type: Three Layer Post Procedure Diagnosis Same as Pre-procedure Electronic Signature(s) Signed: 09/12/2020 2:35:55 PM By: Donnamarie Poag Entered By: Donnamarie Poag on 09/11/2020 16:22:45 [name] ([id number]) -------------------------------------------------------------------------------- Encounter Discharge Information Details Patient Name: [name] Date of Service: 09/11/2020 3:00 PM Medical Record Number: [id number] Patient Account Number: [id number] Date of Birth/Sex: 30-May-1950 (69 y.o. F) Treating RN: Donnamarie Poag Primary Care Aloma Boch: [name] Other Clinician: Referring Nikkol Pai: [name] Treating Rhoda Waldvogel/Extender: [name] in Treatment: 7 Encounter Discharge Information Items Discharge Condition: Stable Ambulatory Status: Wheelchair Discharge Destination: Home Transportation: Private Auto Accompanied By: husband Schedule Follow-up Appointment: Yes Clinical Summary of Care: Electronic Signature(s) Signed: 09/11/2020 5:31:07 PM By: [name] Entered By: [name] on 09/11/2020 17:02:03 [name] ([id number]) -------------------------------------------------------------------------------- Lower Extremity Assessment Details Patient Name: [name] Date of Service: 09/11/2020 3:00 PM Medical Record Number: [id number] Patient Account Number: [id number] Date of Birth/Sex: 18-Aug-1950 (69 y.o. F) Treating RN: Carlene Coria Primary Care Berdine Rasmusson: [name] Other Clinician: Referring Jordyn Doane: [name] Treating Cadie Sorci/Extender: [name] in Treatment: 7 Edema Assessment Assessed: [Left: No] [Right: No] [Left: Edema] [Right: :] Calf Left: Right: Point of Measurement: 36 cm From Medial Instep 34 cm Ankle Left: Right: Point of Measurement: 10 cm From Medial Instep 20 cm Vascular Assessment Pulses: Dorsalis Pedis Palpable: [Left:Yes] Electronic Signature(s) Signed: 09/11/2020 8:20:21 PM By: Carlene Coria RN Entered By: Carlene Coria on 09/11/2020 16:08:42 Verga, [name] ([id number]) -------------------------------------------------------------------------------- Multi Wound Chart Details Patient Name: [name] Date of Service: 09/11/2020 3:00 PM Medical Record Number: [id number] Patient Account Number: [id number] Date of Birth/Sex: 06-Jun-1950 (69 y.o. F) Treating RN: Donnamarie Poag Primary Care Lathyn Griggs: [name] Other Clinician: Referring Joylynn Defrancesco: [name] Treating Mateya Torti/Extender: [name] in Treatment: 7 Vital Signs Height(in): 37 Pulse(bpm): 52 Weight(lbs): 265 Blood Pressure(mmHg): 125/76 Body Mass Index(BMI): 45 Temperature(F): 98.2 Respiratory Rate(breaths/min): 20 Photos: [N/A:N/A] Wound Location: Left Calcaneus N/A N/A Wounding Event: Pressure Injury N/A N/A Primary Etiology: Pressure Ulcer N/A N/A Comorbid History: Cataracts, Chronic Obstructive N/A N/A Pulmonary Disease (COPD), Sleep Apnea, Hypertension, Osteoarthritis,  Neuropathy Date Acquired: 06/27/2020 N/A N/A Weeks of Treatment: 7 N/A N/A Wound Status: Open N/A N/A Measurements L x W x D (cm) 2.2x2.5x0.3 N/A N/A Area (cm) : 4.32 N/A N/A Volume (cm) : 1.296 N/A N/A % Reduction in Area: 15.40% N/A N/A % Reduction in Volume: -26.90% N/A N/A Classification: Category/Stage III N/A N/A Exudate Amount: Medium N/A N/A Exudate Type: Serosanguineous N/A N/A Exudate Color: red, brown N/A N/A Wound Margin: Thickened N/A N/A Granulation Amount: Medium (34-66%) N/A N/A Granulation Quality: Pink, Hyper-granulation N/A N/A Necrotic Amount: Medium (34-66%) N/A N/A Exposed Structures: Fat Layer (Subcutaneous Tissue): N/A N/A Yes Fascia: No Tendon: No Muscle: No Joint: No Bone: No Epithelialization: None N/A N/A Treatment Notes Electronic Signature(s) Signed: 09/12/2020 2:35:55 PM By: Donnamarie Poag Entered ByDonnamarie Poag on 09/11/2020 16:22:17 Smithey, Annel H. (  [id number]) -------------------------------------------------------------------------------- Multi-Disciplinary Care Plan Details Patient Name: CAYLYN, TEDESCHI Date of Service: 09/11/2020 3:00 PM Medical Record Number: [id number] Patient Account Number: [id number] Date of Birth/Sex: 13-Oct-1950 (69 y.o. F) Treating RN: Donnamarie Poag Primary Care Millard Bautch: [name] Other Clinician: Referring Caitriona Sundquist: [name] Treating Reef Achterberg/Extender: [name] in Treatment: 7 Active Inactive Wound/Skin Impairment Nursing Diagnoses: Impaired tissue integrity Goals: Patient/caregiver will verbalize understanding of skin care regimen Date Initiated: 07/20/2020 Date Inactivated: 09/04/2020 Target Resolution Date: 09/19/2020 Goal Status: Met Ulcer/skin breakdown will have a volume reduction of 30% by week 4 Date Initiated: 07/20/2020 Date Inactivated: 08/25/2020 Target Resolution Date: 08/20/2020 Goal Status: Met Ulcer/skin breakdown will have a volume reduction of 50% by week 8 Date  Initiated: 07/20/2020 Target Resolution Date: 09/19/2020 Goal Status: Active Ulcer/skin breakdown will have a volume reduction of 80% by week 12 Date Initiated: 07/20/2020 Target Resolution Date: 10/20/2020 Goal Status: Active Ulcer/skin breakdown will heal within 14 weeks Date Initiated: 07/20/2020 Target Resolution Date: 11/19/2020 Goal Status: Active Interventions: Assess patient/caregiver ability to obtain necessary supplies Assess patient/caregiver ability to perform ulcer/skin care regimen upon admission and as needed Provide education on ulcer and skin care Treatment Activities: Referred to DME Cobin Cadavid for dressing supplies : 07/20/2020 Skin care regimen initiated : 07/20/2020 Notes: Electronic Signature(s) Signed: 09/12/2020 2:35:55 PM By: Donnamarie Poag Entered By: Donnamarie Poag on 09/11/2020 16:21:56 Junkins, [name] ([id number]) -------------------------------------------------------------------------------- Pain Assessment Details Patient Name: [name] Date of Service: 09/11/2020 3:00 PM Medical Record Number: [id number] Patient Account Number: [id number] Date of Birth/Sex: 1951/03/26 (69 y.o. F) Treating RN: Carlene Coria Primary Care Trip Cavanagh: [name] Other Clinician: Referring Harneet Noblett: [name] Treating Khole Branch/Extender: [name] in Treatment: 7 Active Problems Location of Pain Severity and Description of Pain Patient Has Paino No Site Locations Pain Management and Medication Current Pain Management: Electronic Signature(s) Signed: 09/11/2020 8:20:21 PM By: Carlene Coria RN Entered By: Carlene Coria on 09/11/2020 16:00:38 [name] ([id number]) -------------------------------------------------------------------------------- Patient/Caregiver Education Details Patient Name: [name] Date of Service: 09/11/2020 3:00 PM Medical Record Number: [id number] Patient Account Number: [id number] Date of Birth/Gender: [birth year] (69 y.o.  F) Treating RN: Donnamarie Poag Primary Care Physician: [name] Other Clinician: Referring Physician: [name] Treating Physician/Extender: [name] in Treatment: 7 Education Assessment Education Provided To: Patient Education Topics Provided Basic Hygiene: Venous: Wound/Skin Impairment: Electronic Signature(s) Signed: 09/12/2020 2:35:55 PM By: Donnamarie Poag Entered By: Donnamarie Poag on 09/11/2020 16:25:09 [name] ([id number]) -------------------------------------------------------------------------------- Wound Assessment Details Patient Name: [name] Date of Service: 09/11/2020 3:00 PM Medical Record Number: [id number] Patient Account Number: [id number] Date of Birth/Sex: 10-Sep-1950 (69 y.o. F) Treating RN: Carlene Coria Primary Care Dhairya Corales: [name] Other Clinician: Referring Arneta Mahmood: [name] Treating Kameelah Minish/Extender: [name] in Treatment: 7 Wound Status Wound Number: 12 Primary Pressure Ulcer Etiology: Wound Location: Left Calcaneus Wound Open Wounding Event: Pressure Injury Status: Date Acquired: 06/27/2020 Comorbid Cataracts, Chronic Obstructive Pulmonary Disease Weeks Of Treatment: 7 History: (COPD), Sleep Apnea, Hypertension, Osteoarthritis, Clustered Wound: No Neuropathy Photos Wound Measurements Length: (cm) 2.2 Width: (cm) 2.5 Depth: (cm) 0.3 Area: (cm) 4.32 Volume: (cm) 1.296 % Reduction in Area: 15.4% % Reduction in Volume: -26.9% Epithelialization: None Tunneling: No Undermining: No Wound Description Classification: Category/Stage III Wound Margin: Thickened Exudate Amount: Medium Exudate Type: Serosanguineous Exudate Color: red, brown Foul Odor After Cleansing: No Slough/Fibrino Yes Wound Bed Granulation Amount: Medium (34-66%) Exposed Structure Granulation Quality: Pink, Hyper-granulation Fascia Exposed: No Necrotic Amount: Medium (34-66%) Fat Layer (  Subcutaneous  Tissue) Exposed: Yes Necrotic Quality: Adherent Slough Tendon Exposed: No Muscle Exposed: No Joint Exposed: No Bone Exposed: No Treatment Notes Wound #12 (Calcaneus) Wound Laterality: Left Cleanser Normal Saline Discharge Instruction: Wash your hands with soap and water. Remove old dressing, discard into plastic bag and place into trash. Cleanse the wound with Normal Saline prior to applying a clean dressing using gauze sponges, not tissues or cotton balls. Do not Cartelli, Larkin H. ([id number]) scrub or use excessive force. Pat dry using gauze sponges, not tissue or cotton balls. Peri-Wound Care Desitin Maximum Strength Ointment 4 (oz) Discharge Instruction: apply around perimeter of wound for protection Topical Primary Dressing Hydrofera Blue Ready Transfer Foam, 4x5 (in/in) Discharge Instruction: Apply Hydrofera Blue Ready to wound bed as directed Secondary Dressing Xtrasorb Medium 4x5 (in/in) Discharge Instruction: Apply to wound as directed. Do not cut. Secured With Compression Wrap Profore Lite LF 3 Multilayer Compression Bandaging System Discharge Instruction: Apply 3 multi-layer wrap as prescribed. Compression Stockings Add-Ons Electronic Signature(s) Signed: 09/11/2020 8:20:21 PM By: Carlene Coria RN Entered By: Carlene Coria on 09/11/2020 16:08:12 [name] ([id number]) -------------------------------------------------------------------------------- Vitals Details Patient Name: [name] Date of Service: 09/11/2020 3:00 PM Medical Record Number: [id number] Patient Account Number: [id number] Date of Birth/Sex: May 14, 1950 (69 y.o. F) Treating RN: Carlene Coria Primary Care Grover Robinson: [name] Other Clinician: Referring Jamy Cleckler: [name] Treating Magdeline Prange/Extender: [name] in Treatment: 7 Vital Signs Time Taken: 16:00 Temperature (F): 98.2 Height (in): 64 Pulse (bpm): 84 Weight (lbs): 265 Respiratory Rate (breaths/min): 20 Body  Mass Index (BMI): 45.5 Blood Pressure (mmHg): 125/76 Reference Range: 80 - 120 mg / dl Electronic Signature(s) Signed: 09/11/2020 8:20:21 PM By: Carlene Coria RN Entered By: Carlene Coria on 09/11/2020 16:00:32

## 2020-09-11 NOTE — Progress Notes (Addendum)
ANNIQUE, STICKER ([id number]) Visit Report for 09/11/2020 Chief Complaint Document Details Patient Name: [name], [name] Date of Service: 09/11/2020 3:00 PM Medical Record Number: [id number] Patient Account Number: [id number] Date of Birth/Sex: Oct 23, 1950 (69 y.o. F) Treating RN: Donnamarie Poag Primary Care Provider: [name] Other Clinician: Referring Provider: [name] Treating Provider/Extender: [name] in Treatment: 7 Information Obtained from: Patient Chief Complaint Left Heel Pressure Ulcer Electronic Signature(s) Signed: 09/11/2020 3:04:28 PM By: Worthy Keeler PA-C Entered By: Worthy Keeler on 09/11/2020 15:04:28 [name] -------------------------------------------------------------------------------- HPI Details Patient Name: [name] Date of Service: 09/11/2020 3:00 PM Medical Record Number: [id number] Patient Account Number: [id number] Date of Birth/Sex: [birth year] (69 y.o. F) Treating RN: Donnamarie Poag Primary Care Provider: [name] Other Clinician: Referring Provider: [name] Treating Provider/Extender: [name] in Treatment: 7 History of Present Illness HPI Description: 70 year old with a past medical history significant for [name], urinary incontinence, and obesity. She has been seen in the wound clinic before for lower extremity ulcerations treated with compression therapy. she is also known to have hypertension, peripheral vascular disease, COPD, obstructive sleep apnea, lumbar radiculopathy, kyphoscoliosis, urinary issues and tobacco abuse. Smokes a packet of cigarettes a day was recently seen at the [facility] for swelling of her legs and feet with a ulceration on the dorsum of the right foot which has been there for about 6 months. she was recently in the ER about a month ago where she was seen for shortness of breath and swelling of the legs and a chest x-ray was within  normal limits had an increase in her BNP and was given Lasix and put on doxycycline for a mild cellulitis and possible UTI. Wounds aresmaller today 11/13/15. Debrided and will continue Santyl. 12/21/2015 -- she was admitted to the hospital overnight on 12/18/2015 and diagnosed with urinary retention and cellulitis of the left lower leg. is past to take clindamycin and use Santyl for her wounds. 01/15/2016 -- come back to see [place] for almost a month and continues to be noncompliant with her dressings 01/30/16 patient presents today for a follow-up visit concerning her ongoing bilateral lower extremity wounds. We have not seen her for the past 2 weeks although we are supposed to be seen her for weekly visits. She currently has a Foley catheter. She tells me that the wounds are intensely painful especially with pressure and palpation at this point in time. Fortunately she is having no interval signs or symptoms of systemic infection but unfortunately the wounds have not improved dramatically since we last saw her. She does have home health coming to take care of her as well. She is currently not in any compression wraps. 02/06/16 ON evaluation today patient continues to experience discomfort regarding her bilateral lower extremity ulcers. She has continued to tolerate the dressing changes at this point in time and continues to have a Foley catheter as well. Fortunately she is back this week in the past she has been somewhat noncompliant with follow-ups I'm glad to see her today. She tells me that her pain level varies but can be as high as a 7 out of 10 with manipulation of the wound. She tells me that she used to be on oxycodone which was managed by the pain clinic although she is no longer on that as she tells me that she was actually smoking marijuana at the time and when this was found out they discontinued her pain medication. She no longer  is taking anything pain medication wise and she also does not  smoke cigarettes nor marijuana at this point. 02/12/16; this is a patient I don't believe I have previously seen. She has multiple sclerosis. She has 4 punched-out areas on the anterior lateral left leg 2 areas on the right these are all in the same condition. Reasonably small [dime size] wounds each was some depth. Surface of these wounds does not look particularly healthy as there is adherent slough. There is no evidence of surrounding infection or inflammation. ABIs in this clinic were 0.87 on the right and 0.81 on the left. She is listed as having PAD and is a smoker. Not a diabetic 02/20/16 patient I gave antibiotics to last week for erythema around both wound sites on the left lateral leg and right lateral leg. This appears to be a lot better. One of the areas on the left leg is healed however she still has 3 punched-out open areas on the left lateral calf and one on the right lateral calf and one on the dorsal foot. She is an ex-smoker quitting 1 month ago. ABIs in this clinic were 0.87 on the right 0.81 on the left 03/12/16; this is a patient I really don't have a good sense of. It would appear for the first 5 or 6 weeks of her stay in this clinic she was cared for by Dr. Con Memos. She appeared on my schedule in mid October and I saw her twice. She has not been here however in 3 weeks. She has advanced Wound Care at home where she lives with her husband. She has multiple sclerosis. I saw her the first time she had 4 punched-out small wounds on the anterior lateral left leg it appears that she is now down to 2. She also had wounds on the lateral and medial aspect of her right leg which were also small and punched out. The only one that remains is on the right lateral. Because of the nature of her wound I went ahead and ordered formal arterial studies this showed an ABI of 1 on the right and one on the left. TBI of 1.4 on the right and 0.79 on the left. She had normal waveforms. She was in the  emergency room on 02/28/16; there is a noted edema of her left leg after a fall. She had a duplex ultrasound that showed no evidence for an acute DVT from the left groin to the popliteal fossa. The study was limited in the calf veins due to edema. Also noticeable that she had a left inguinal enlarged lymph node up to 6.2 cm. She also had a left knee x-ray that showed no acute findings and a right foot x-ray that showed soft tissue swelling but no radiographic evidence of osteomyelitis. The patient does stop smoking 03-19-16 [name] presents today for evaluation of her bilateral lower extremity ulcerations. She states that she has not smoked in several weeks. She denies any pain or discomfort to bilateral lower extremities, tolerating compression therapy as ordered. 03/26/16; I have not seen this patient in 2 weeks however she has done very well with improvements in the areas in question. After we obtain normal arterial studies, increasing the 4-layer compression really seems to look done a nice job here. She has one open area on the left lateral leg and one on the medial right leg. These have filled in nicely and are now superficial wounds that showed epithelialized. I note the left inguinal lymph node at  6.2 cm. I may need to refer this back to the patient's primary doctor. 04/03/16; patient has 2 remaining wounds 1 on the left lateral leg and one on the right medial leg. Both of filled in nicely since we are able to increase her from a 3 layer to a 4 layer compression. I am also following up on the lymph node notable on her left leg DVT rule out in November 04/10/16; area on the right lateral leg is healed. The area on the left leg is still open but looks considerably better with healthy granulation and less wound area. I12/20/17; last week we healed the patient doubt with regards to the wound on the right leg to her on compression stocking 20-30 mmHg as it turns out I don't think she had a stocking,  predictably therefore she has reopened. The left leg as well as the wound on the lateral aspect. Been generally small line 04/24/16; we healed out her right leg 2 weeks ago to her own 20-30 mm compression stockings although as it turns out she didn't have these and did not purchase some apparently because of financial issues. The left leg wound is on the lateral aspect. Both of these wounds are just about healed. 05/01/16; her wounds on the left leg are healed out today. The area on the right leg is also healed. Vitamin diligent effort of our staff we have not been able to get any form of compression stocking to this patient. The orders for juxtalite's were given to home health this never materialized. We ordered compression stockings I don't think she was able to afford these. We had a discussion today with both the patient and her husband without these, will accumulate edema and the wounds will simply reopen again. 05/14/16 READMISSION [name], [name] ([id number]) this is a patient we healed out 2 weeks ago. As bilateral lower extremity venous wounds probably some degree of lymphedema. At the beginning in November I did a duplex ultrasound of her left leg that was negative for DVT but did show a lymph node in the left inguinal area presumably reactive. We've been using compression to her lower extremities eventually healed all her wounds on her bilateral calves but we were not able to get any form of compression stockings for her in spite of intense efforts of our staff. She returns today with reopening on the left leg did not the right she has several superficial areas anteriorly but an actual frank ulcer on the lateral left calf. This is about the size of a dime or smaller. She does not really complain of pain. The patient has a history of PAD however arterial studies we did in November were normal. Her ABIs and Doppler waveforms were both within the normal limits. The patient has a history of [name].  In discussing things with her today it would appear that the opening was noted 2 weeks ago according to the patient. We gave her Tubigrip stockings when she left the clinic 05/21/16; she has not yet had the duplex ultrasound of her thigh, this is booked for Thursday. The wound on the left lateral lower leg is improved 05/28/16; her duplex ultrasound of the left leg specifically her left thigh did not show a DVT however did show hypoechogenic enlarged left inguinal lymph nodes up to 6.9 x 3.7 x 6.8. This also showed in the one in November however maximal diameter was 6.2, at that point I thought these were reactive secondary to cellulitis however there is obviously  a differential here. Her wound on the left lower leg is closed. She has a superficial open area on the base of her left fifth metatarsal head on the plantar foot. It looks as though she has some wrap injuries on the anterior leg 06/05/16; since she was last year she had a fall on 1/31. She was seen in the ER but sent back home. I think she is also been seen by her primary physician who picked up on the swelling and the lymphadenopathy. She is ordered a CT scan of the abdomen and pelvis however this will be without contrast because of stage IIIB renal insufficiency. Nevertheless this should be a start the workup to make sure there is not is more systemic problem here. She will probably need consideration of a biopsy of the lymph node in her inguinal area, this would need general surgery. As far as her wounds are concern today the area on the left plantar foot is healed. She has a small weeping area on the left lower leg. The wrap injury from last week has largely Pardoxically there is a lot less edema in the left thigh. 06/12/16; CT scan is on Friday. She has 2 small open areas one on the lateral left lower leg and one on the anterior lower leg on the left. Both of these looks healthy. I reduced her compression last week to Kerlix and Coban this  seems to have not a reasonable job 07/02/16; the patient is now in the skilled facility. I think this was arranged out of the ER when she fell at home although I'm not exactly sure. She is going for I think a laparotomy for the pelvic mass next week. All the wounds in the left leg have healed Readmission: 07/20/2020 upon evaluation today patient presents for reevaluation here in clinic unfortunately she is having issues with a significant stage III pressure ulcer to her left heel. This is something that occurred when she was in the hospital sick. And subsequently though she does not appear to have any new injury she is still trying to recover from the pressure injury which was quite severe during that time. I do not see any signs of infection right now which is great news. With that being said she has been require some debridement to try to clear this up as much as possible. We need to get the wound VAC cleared so that she can see overall healing and improvement that were looking for. The patient does have a history of hypertension, chronic kidney disease stage III, COPD, and multiple sclerosis. Currently she has been trying to do what she can to keep pressure off of her heel while in bed and in other locations as well. 08/04/20 upon evaluation today patient appears to be doing extremely well currently in regard to her wound on the heel. I do believe the Iodoflex has helped loosen things appear. Unfortunately she tells me that the home health nurse has not been putting the Iodoflex on the wound. That is news to [place] and that they tell me they got supplies in the mail but they did not see the Iodoflex being used. Nonetheless I am wondering if this is just something that they missed or if it truly has not been. I did tell them to look when they get home in their box of supplies to see if they saw the dressing which showed and what it should look like. 08/11/2020 upon evaluation today patient appears to be  doing decently  well in regard to her wound although unfortunately this is showing signs of some blue-green drainage consistent with Pseudomonas it does have a little worried in this regard. Fortunately there is no evidence of active infection which is great news. No fevers, chills, nausea, vomiting, or diarrhea. 08/18/2020 upon evaluation today patient appears to be doing well with regard to her wound. She has been tolerating the dressing changes without complication. Fortunately there is no signs of active infection at this time. No fevers, chills, nausea, vomiting, or diarrhea. 4/29; left heel wound pressure ulcer. Slightly smaller. 09/04/2020 upon evaluation today patient appears to be doing well with regard to her wound. This is actually on her heel and seems to be doing well other than the fact that is a little bit moist from excessive drainage. There does not appear to be any signs of active infection at this time. No fevers, chills, nausea, vomiting, or diarrhea.. 09/11/2020 upon evaluation today patient appears to be doing okay in regard to her wound. With that being said she still has quite a bit of drainage noted at this point. Fortunately there is no signs of active infection which is great news overall I am extremely pleased with where things stand today. I do think we need to try to do something a little bit more to help control the drainage a bit better. Electronic Signature(s) Signed: 09/11/2020 4:25:42 PM By: Worthy Keeler PA-C Entered By: Worthy Keeler on 09/11/2020 16:25:41 [name] -------------------------------------------------------------------------------- Physical Exam Details Patient Name: [name] Date of Service: 09/11/2020 3:00 PM Medical Record Number: [id number] Patient Account Number: [id number] Date of Birth/Sex: 04/09/1951 (69 y.o. F) Treating RN: Donnamarie Poag Primary Care Provider: [name] Other Clinician: Referring Provider:  [name] Treating Provider/Extender: [name] in Treatment: 7 Constitutional Well-nourished and well-hydrated in no acute distress. Respiratory normal breathing without difficulty. Psychiatric this patient is able to make decisions and demonstrates good insight into disease process. Alert and Oriented x 3. pleasant and cooperative. Notes No feverUpon inspection patient's wound bed actually showed signs of good granulation epithelization at this point. There does not appear to be any evidence of active infection which is great news and overall very pleased with where things stand today. Chills noted I do think that we can probably add XtraSorb to the heel after the Hydrofera Blue to try to control little bit of the drainage a bit better. Electronic Signature(s) Signed: 09/11/2020 4:26:14 PM By: Worthy Keeler PA-C Entered By: Worthy Keeler on 09/11/2020 16:26:13 [name] -------------------------------------------------------------------------------- Physician Orders Details Patient Name: [name] Date of Service: 09/11/2020 3:00 PM Medical Record Number: [id number] Patient Account Number: [id number] Date of Birth/Sex: [birth year] (69 y.o. F) Treating RN: Donnamarie Poag Primary Care Provider: [name] Other Clinician: Referring Provider: [name] Treating Provider/Extender: [name] in Treatment: 7 Verbal / Phone Orders: No Diagnosis Coding ICD-10 Coding Code Description [phone] Pressure ulcer of left heel, stage 3 I10 Essential (primary) hypertension N18.30 Chronic kidney disease, stage 3 unspecified J44.9 Chronic obstructive pulmonary disease, unspecified G35 Multiple sclerosis Follow-up Appointments o Return Appointment in 1 week. [place]: - [facility] for wound care. May utilize formulary equivalent dressing for wound treatment orders unless otherwise  specified. Home Health Nurse may visit PRN to address patientos wound care needs. o Scheduled days for dressing changes to be completed; exception, patient has scheduled wound care visit that day. -  Mondays and Thursdays o **Please direct any NON-WOUND related issues/requests for orders to patient's Primary Care Physician. **If current dressing causes regression in wound condition, may D/C ordered dressing product/s and apply Normal Saline Moist Dressing daily until next [place] or Other MD appointment. **Notify [facility]aling [facility] of regression in wound condition at [phone]. Edema Control - Lymphedema / Segmental Compressive Device / Other Left Lower Extremity o Optional: One layer of unna paste to top of compression wrap (to act as an anchor). o Elevate, Exercise Daily and Avoid Standing for Long Periods of Time. o Elevate legs to the level of the heart and pump ankles as often as possible - ELEVATE LEGS o Elevate leg(s) parallel to the floor when sitting. Off-Loading Left Lower Extremity o Open toe surgical shoe o Turn and reposition every 2 hours o Other: - Keep pressure off left heel Wound Treatment Wound #12 - Calcaneus Wound Laterality: Left Cleanser: Normal Saline 2 x Per Week/30 Days Discharge Instructions: Wash your hands with soap and water. Remove old dressing, discard into plastic bag and place into trash. Cleanse the wound with Normal Saline prior to applying a clean dressing using gauze sponges, not tissues or cotton balls. Do not scrub or use excessive force. Pat dry using gauze sponges, not tissue or cotton balls. Peri-Wound Care: Desitin Maximum Strength Ointment 4 (oz) 2 x Per Week/30 Days Discharge Instructions: apply around perimeter of wound for protection Primary Dressing: Hydrofera Blue Ready Transfer Foam, 4x5 (in/in) (Generic) 2 x Per Week/30 Days Discharge Instructions: Apply Hydrofera Blue Ready to wound bed as  directed Secondary Dressing: Xtrasorb Medium 4x5 (in/in) 2 x Per Week/30 Days Discharge Instructions: Apply to wound as directed. Do not cut. Compression Wrap: Profore Lite LF 3 Multilayer Compression Bandaging System 2 x Per Week/30 Days Discharge Instructions: Apply 3 multi-layer wrap as prescribed. [name], [name] ([id number]) Electronic Signature(s) Signed: 09/11/2020 5:15:41 PM By: Worthy Keeler PA-C Signed: 09/12/2020 2:35:55 PM By: Donnamarie Poag Entered By: Donnamarie Poag on 09/11/2020 16:24:28 [name] ([id number]) -------------------------------------------------------------------------------- Problem List Details Patient Name: [name] Date of Service: 09/11/2020 3:00 PM Medical Record Number: [id number] Patient Account Number: [id number] Date of Birth/Sex: [birth year] (69 y.o. F) Treating RN: Donnamarie Poag Primary Care Provider: [name] Other Clinician: Referring Provider: [name] Treating Provider/Extender: [name] in Treatment: 7 Active Problems ICD-10 Encounter Code Description Active Date MDM Diagnosis [phone] Pressure ulcer of left heel, stage 3 07/20/2020 No Yes I10 Essential (primary) hypertension 07/20/2020 No Yes N18.30 Chronic kidney disease, stage 3 unspecified 07/20/2020 No Yes J44.9 Chronic obstructive pulmonary disease, unspecified 07/20/2020 No Yes G35 Multiple sclerosis 07/20/2020 No Yes Inactive Problems Resolved Problems Electronic Signature(s) Signed: 09/11/2020 3:04:21 PM By: Worthy Keeler PA-C Entered By: Worthy Keeler on 09/11/2020 15:04:21 [name], [name] ([id number]) -------------------------------------------------------------------------------- Progress Note Details Patient Name: [name] Date of Service: 09/11/2020 3:00 PM Medical Record Number: [id number] Patient Account Number: [id number] Date of Birth/Sex: 1950-12-03 (69 y.o. F) Treating RN: Donnamarie Poag Primary Care Provider: [name] Other  Clinician: Referring Provider: [name] Treating Provider/Extender: [name] in Treatment: 7 Subjective Chief Complaint Information obtained from Patient Left Heel Pressure Ulcer History of Present Illness (HPI) 70 year old with a past medical history significant for [name], urinary incontinence, and obesity. She has been seen in the wound clinic before for lower extremity ulcerations treated with compression therapy. she is also known to have hypertension, peripheral vascular disease, COPD, obstructive sleep apnea, lumbar radiculopathy,  kyphoscoliosis, urinary issues and tobacco abuse. Smokes a packet of cigarettes a day was recently seen at the [facility] for swelling of her legs and feet with a ulceration on the dorsum of the right foot which has been there for about 6 months. she was recently in the ER about a month ago where she was seen for shortness of breath and swelling of the legs and a chest x-ray was within normal limits had an increase in her BNP and was given Lasix and put on doxycycline for a mild cellulitis and possible UTI. Wounds aresmaller today 11/13/15. Debrided and will continue Santyl. 12/21/2015 -- she was admitted to the hospital overnight on 12/18/2015 and diagnosed with urinary retention and cellulitis of the left lower leg. is past to take clindamycin and use Santyl for her wounds. 01/15/2016 -- come back to see [place] for almost a month and continues to be noncompliant with her dressings 01/30/16 patient presents today for a follow-up visit concerning her ongoing bilateral lower extremity wounds. We have not seen her for the past 2 weeks although we are supposed to be seen her for weekly visits. She currently has a Foley catheter. She tells me that the wounds are intensely painful especially with pressure and palpation at this point in time. Fortunately she is having no interval signs or symptoms of systemic infection but unfortunately the  wounds have not improved dramatically since we last saw her. She does have home health coming to take care of her as well. She is currently not in any compression wraps. 02/06/16 ON evaluation today patient continues to experience discomfort regarding her bilateral lower extremity ulcers. She has continued to tolerate the dressing changes at this point in time and continues to have a Foley catheter as well. Fortunately she is back this week in the past she has been somewhat noncompliant with follow-ups I'm glad to see her today. She tells me that her pain level varies but can be as high as a 7 out of 10 with manipulation of the wound. She tells me that she used to be on oxycodone which was managed by the pain clinic although she is no longer on that as she tells me that she was actually smoking marijuana at the time and when this was found out they discontinued her pain medication. She no longer is taking anything pain medication wise and she also does not smoke cigarettes nor marijuana at this point. 02/12/16; this is a patient I don't believe I have previously seen. She has multiple sclerosis. She has 4 punched-out areas on the anterior lateral left leg 2 areas on the right these are all in the same condition. Reasonably small [dime size] wounds each was some depth. Surface of these wounds does not look particularly healthy as there is adherent slough. There is no evidence of surrounding infection or inflammation. ABIs in this clinic were 0.87 on the right and 0.81 on the left. She is listed as having PAD and is a smoker. Not a diabetic 02/20/16 patient I gave antibiotics to last week for erythema around both wound sites on the left lateral leg and right lateral leg. This appears to be a lot better. One of the areas on the left leg is healed however she still has 3 punched-out open areas on the left lateral calf and one on the right lateral calf and one on the dorsal foot. She is an ex-smoker  quitting 1 month ago. ABIs in this clinic were 0.87 on  the right 0.81 on the left 03/12/16; this is a patient I really don't have a good sense of. It would appear for the first 5 or 6 weeks of her stay in this clinic she was cared for by Dr. Con Memos. She appeared on my schedule in mid October and I saw her twice. She has not been here however in 3 weeks. She has advanced Wound Care at home where she lives with her husband. She has multiple sclerosis. I saw her the first time she had 4 punched-out small wounds on the anterior lateral left leg it appears that she is now down to 2. She also had wounds on the lateral and medial aspect of her right leg which were also small and punched out. The only one that remains is on the right lateral. Because of the nature of her wound I went ahead and ordered formal arterial studies this showed an ABI of 1 on the right and one on the left. TBI of 1.4 on the right and 0.79 on the left. She had normal waveforms. She was in the emergency room on 02/28/16; there is a noted edema of her left leg after a fall. She had a duplex ultrasound that showed no evidence for an acute DVT from the left groin to the popliteal fossa. The study was limited in the calf veins due to edema. Also noticeable that she had a left inguinal enlarged lymph node up to 6.2 cm. She also had a left knee x-ray that showed no acute findings and a right foot x-ray that showed soft tissue swelling but no radiographic evidence of osteomyelitis. The patient does stop smoking 03-19-16 [name] presents today for evaluation of her bilateral lower extremity ulcerations. She states that she has not smoked in several weeks. She denies any pain or discomfort to bilateral lower extremities, tolerating compression therapy as ordered. 03/26/16; I have not seen this patient in 2 weeks however she has done very well with improvements in the areas in question. After we obtain normal arterial studies, increasing the  4-layer compression really seems to look done a nice job here. She has one open area on the left lateral leg and one on the medial right leg. These have filled in nicely and are now superficial wounds that showed epithelialized. I note the left inguinal lymph node at 6.2 cm. I may need to refer this back to the patient's primary doctor. 04/03/16; patient has 2 remaining wounds 1 on the left lateral leg and one on the right medial leg. Both of filled in nicely since we are able to increase her from a 3 layer to a 4 layer compression. I am also following up on the lymph node notable on her left leg DVT rule out in November 04/10/16; area on the right lateral leg is healed. The area on the left leg is still open but looks considerably better with healthy granulation and less wound area. I12/20/17; last week we healed the patient doubt with regards to the wound on the right leg to her on compression stocking 20-30 mmHg as it turns out I don't think she had a stocking, predictably therefore she has reopened. The left leg as well as the wound on the lateral aspect. Been generally small line 04/24/16; we healed out her right leg 2 weeks ago to her own 20-30 mm compression stockings although as it turns out she didn't have these and did not purchase some apparently because of financial issues. The left leg wound  is on the lateral aspect. Both of these wounds are just about healed. 05/01/16; her wounds on the left leg are healed out today. The area on the right leg is also healed. Vitamin diligent effort of our staff we have not been able to get any form of compression stocking to this patient. The orders for juxtalite's were given to home health this never materialized. We Perine, Lewis H. ([id number]) ordered compression stockings I don't think she was able to afford these. We had a discussion today with both the patient and her husband without these, will accumulate edema and the wounds will simply reopen  again. 05/14/16 READMISSION this is a patient we healed out 2 weeks ago. As bilateral lower extremity venous wounds probably some degree of lymphedema. At the beginning in November I did a duplex ultrasound of her left leg that was negative for DVT but did show a lymph node in the left inguinal area presumably reactive. We've been using compression to her lower extremities eventually healed all her wounds on her bilateral calves but we were not able to get any form of compression stockings for her in spite of intense efforts of our staff. She returns today with reopening on the left leg did not the right she has several superficial areas anteriorly but an actual frank ulcer on the lateral left calf. This is about the size of a dime or smaller. She does not really complain of pain. The patient has a history of PAD however arterial studies we did in November were normal. Her ABIs and Doppler waveforms were both within the normal limits. The patient has a history of [name]. In discussing things with her today it would appear that the opening was noted 2 weeks ago according to the patient. We gave her Tubigrip stockings when she left the clinic 05/21/16; she has not yet had the duplex ultrasound of her thigh, this is booked for Thursday. The wound on the left lateral lower leg is improved 05/28/16; her duplex ultrasound of the left leg specifically her left thigh did not show a DVT however did show hypoechogenic enlarged left inguinal lymph nodes up to 6.9 x 3.7 x 6.8. This also showed in the one in November however maximal diameter was 6.2, at that point I thought these were reactive secondary to cellulitis however there is obviously a differential here. Her wound on the left lower leg is closed. She has a superficial open area on the base of her left fifth metatarsal head on the plantar foot. It looks as though she has some wrap injuries on the anterior leg 06/05/16; since she was last year she had a fall on  1/31. She was seen in the ER but sent back home. I think she is also been seen by her primary physician who picked up on the swelling and the lymphadenopathy. She is ordered a CT scan of the abdomen and pelvis however this will be without contrast because of stage IIIB renal insufficiency. Nevertheless this should be a start the workup to make sure there is not is more systemic problem here. She will probably need consideration of a biopsy of the lymph node in her inguinal area, this would need general surgery. As far as her wounds are concern today the area on the left plantar foot is healed. She has a small weeping area on the left lower leg. The wrap injury from last week has largely Pardoxically there is a lot less edema in the left thigh. 06/12/16; CT  scan is on Friday. She has 2 small open areas one on the lateral left lower leg and one on the anterior lower leg on the left. Both of these looks healthy. I reduced her compression last week to Kerlix and Coban this seems to have not a reasonable job 07/02/16; the patient is now in the skilled facility. I think this was arranged out of the ER when she fell at home although I'm not exactly sure. She is going for I think a laparotomy for the pelvic mass next week. All the wounds in the left leg have healed Readmission: 07/20/2020 upon evaluation today patient presents for reevaluation here in clinic unfortunately she is having issues with a significant stage III pressure ulcer to her left heel. This is something that occurred when she was in the hospital sick. And subsequently though she does not appear to have any new injury she is still trying to recover from the pressure injury which was quite severe during that time. I do not see any signs of infection right now which is great news. With that being said she has been require some debridement to try to clear this up as much as possible. We need to get the wound VAC cleared so that she can see overall  healing and improvement that were looking for. The patient does have a history of hypertension, chronic kidney disease stage III, COPD, and multiple sclerosis. Currently she has been trying to do what she can to keep pressure off of her heel while in bed and in other locations as well. 08/04/20 upon evaluation today patient appears to be doing extremely well currently in regard to her wound on the heel. I do believe the Iodoflex has helped loosen things appear. Unfortunately she tells me that the home health nurse has not been putting the Iodoflex on the wound. That is news to [place] and that they tell me they got supplies in the mail but they did not see the Iodoflex being used. Nonetheless I am wondering if this is just something that they missed or if it truly has not been. I did tell them to look when they get home in their box of supplies to see if they saw the dressing which showed and what it should look like. 08/11/2020 upon evaluation today patient appears to be doing decently well in regard to her wound although unfortunately this is showing signs of some blue-green drainage consistent with Pseudomonas it does have a little worried in this regard. Fortunately there is no evidence of active infection which is great news. No fevers, chills, nausea, vomiting, or diarrhea. 08/18/2020 upon evaluation today patient appears to be doing well with regard to her wound. She has been tolerating the dressing changes without complication. Fortunately there is no signs of active infection at this time. No fevers, chills, nausea, vomiting, or diarrhea. 4/29; left heel wound pressure ulcer. Slightly smaller. 09/04/2020 upon evaluation today patient appears to be doing well with regard to her wound. This is actually on her heel and seems to be doing well other than the fact that is a little bit moist from excessive drainage. There does not appear to be any signs of active infection at this time. No fevers, chills,  nausea, vomiting, or diarrhea.. 09/11/2020 upon evaluation today patient appears to be doing okay in regard to her wound. With that being said she still has quite a bit of drainage noted at this point. Fortunately there is no signs of active infection which  is great news overall I am extremely pleased with where things stand today. I do think we need to try to do something a little bit more to help control the drainage a bit better. Objective Constitutional Well-nourished and well-hydrated in no acute distress. Vitals Time Taken: 4:00 PM, Height: 64 in, Weight: 265 lbs, BMI: 45.5, Temperature: 98.2 F, Pulse: 84 bpm, Respiratory Rate: 20 breaths/min, Blood Pressure: 125/76 mmHg. Respiratory Mattos, [name] H. ([id number]) normal breathing without difficulty. Psychiatric this patient is able to make decisions and demonstrates good insight into disease process. Alert and Oriented x 3. pleasant and cooperative. General Notes: No feverUpon inspection patient's wound bed actually showed signs of good granulation epithelization at this point. There does not appear to be any evidence of active infection which is great news and overall very pleased with where things stand today. Chills noted I do think that we can probably add XtraSorb to the heel after the Hydrofera Blue to try to control little bit of the drainage a bit better. Integumentary (Hair, Skin) Wound #12 status is Open. Original cause of wound was Pressure Injury. The date acquired was: 06/27/2020. The wound has been in treatment 7 weeks. The wound is located on the Left Calcaneus. The wound measures 2.2cm length x 2.5cm width x 0.3cm depth; 4.32cm^2 area and 1.296cm^3 volume. There is Fat Layer (Subcutaneous Tissue) exposed. There is no tunneling or undermining noted. There is a medium amount of serosanguineous drainage noted. The wound margin is thickened. There is medium (34-66%) pink, hyper - granulation within the wound bed. There is a  medium (34-66%) amount of necrotic tissue within the wound bed including Adherent Slough. Assessment Active Problems ICD-10 Pressure ulcer of left heel, stage 3 Essential (primary) hypertension Chronic kidney disease, stage 3 unspecified Chronic obstructive pulmonary disease, unspecified Multiple sclerosis Procedures Wound #12 Pre-procedure diagnosis of Wound #12 is a Pressure Ulcer located on the Left Calcaneus . There was a Three Layer Compression Therapy Procedure by Donnamarie Poag, RN. Post procedure Diagnosis Wound #12: Same as Pre-Procedure Plan Follow-up Appointments: Return Appointment in 1 week. Home Health: [place] for wound care. May utilize formulary equivalent dressing for wound treatment orders unless otherwise specified. Home Health Nurse may visit PRN to address patient s wound care needs. Scheduled days for dressing changes to be completed; exception, patient has scheduled wound care visit that day. - Mondays and Thursdays **Please direct any NON-WOUND related issues/requests for orders to patient's Primary Care Physician. **If current dressing causes regression in wound condition, may D/C ordered dressing product/s and apply Normal Saline Moist Dressing daily until next [place] or Other MD appointment. **Notify Wound Healing Center of regression in wound condition at [phone]. Edema Control - Lymphedema / Segmental Compressive Device / Other: Optional: One layer of unna paste to top of compression wrap (to act as an anchor). Elevate, Exercise Daily and Avoid Standing for Long Periods of Time. Elevate legs to the level of the heart and pump ankles as often as possible - ELEVATE LEGS Elevate leg(s) parallel to the floor when sitting. Off-Loading: Open toe surgical shoe Turn and reposition every 2 hours Other: - Keep pressure off left heel WOUND #12: - Calcaneus Wound Laterality: Left Cleanser: Normal  Saline 2 x Per Week/30 Days Discharge Instructions: Wash your hands with soap and water. Remove old dressing, discard into plastic bag and place into trash. Cleanse the wound with Normal Saline prior to applying a clean dressing using  gauze sponges, not tissues or cotton balls. Do not scrub or use excessive force. Pat dry using gauze sponges, not tissue or cotton balls. Peri-Wound Care: Desitin Maximum Strength Ointment 4 (oz) 2 x Per Week/30 Days [name], [name] H. ([id number]) Discharge Instructions: apply around perimeter of wound for protection Primary Dressing: Hydrofera Blue Ready Transfer Foam, 4x5 (in/in) (Generic) 2 x Per Week/30 Days Discharge Instructions: Apply Hydrofera Blue Ready to wound bed as directed Secondary Dressing: Xtrasorb Medium 4x5 (in/in) 2 x Per Week/30 Days Discharge Instructions: Apply to wound as directed. Do not cut. Compression Wrap: Profore Lite LF 3 Multilayer Compression Bandaging System 2 x Per Week/30 Days Discharge Instructions: Apply 3 multi-layer wrap as prescribed. 1. Would recommend currently that going continue with the [facility] and I think this is a good option for the patient. She is in agreement with that plan. 2. I am also can recommend that we have the patient continue to use absorptive pads over top I think Lauraine Rinne is can be better than the ABD pads that do not seem to be controlling the drainage sufficiently enough. Also discussed with her elevate her leg to help with the swelling and subsequently the drainage as well. 3. We will continue with 3 layer compression wrap. We will see patient back for reevaluation in 1 week here in the clinic. If anything worsens or changes patient will contact our office for additional recommendations. Electronic Signature(s) Signed: 09/11/2020 4:26:51 PM By: Worthy Keeler PA-C Entered By: Worthy Keeler on 09/11/2020 16:26:50 [name], [name]  ([id number]) -------------------------------------------------------------------------------- SuperBill Details Patient Name: [name] Date of Service: 09/11/2020 Medical Record Number: [id number] Patient Account Number: [id number] Date of Birth/Sex: August 27, 1950 (69 y.o. F) Treating RN: Donnamarie Poag Primary Care Provider: [name] Other Clinician: Referring Provider: [name] Treating Provider/Extender: [name] in Treatment: 7 Diagnosis Coding ICD-10 Codes Code Description [id number] Pressure ulcer of left heel, stage 3 I10 Essential (primary) hypertension N18.30 Chronic kidney disease, stage 3 unspecified J44.9 Chronic obstructive pulmonary disease, unspecified G35 Multiple sclerosis Facility Procedures CPT4 Code: [id number] Description: (Facility Use Only) [phone] - [facility] LEG Modifier: Quantity: 1 Physician Procedures CPT4 Code: [id number] Description: [id number] - WC PHYS LEVEL 3 - EST PT Modifier: Quantity: 1 CPT4 Code: Description: ICD-10 Diagnosis Description L89.623 Pressure ulcer of left heel, stage 3 I10 Essential (primary) hypertension N18.30 Chronic kidney disease, stage 3 unspecified J44.9 Chronic obstructive pulmonary disease, unspecified Modifier: Quantity: Electronic Signature(s) Signed: 09/11/2020 4:27:04 PM By: Worthy Keeler PA-C Entered By: Worthy Keeler on 09/11/2020 16:27:04

## 2020-09-18 ENCOUNTER — Encounter: Payer: Medicare Other | Admitting: Internal Medicine

## 2020-09-18 ENCOUNTER — Other Ambulatory Visit: Payer: Self-pay

## 2020-09-18 DIAGNOSIS — L89623 Pressure ulcer of left heel, stage 3: Secondary | ICD-10-CM | POA: Diagnosis not present

## 2020-09-20 NOTE — Progress Notes (Signed)
[name], [name] ([id number]) Visit Report for 09/18/2020 HPI Details Patient Name: [name], [name] Date of Service: 09/18/2020 2:30 PM Medical Record Number: [id number] Patient Account Number: [id number] Date of Birth/Sex: 05-29-1950 (69 y.o. F) Treating RN: Primary Care Provider: [name] Other Clinician: Referring Provider: [name] Treating Provider/Extender: Tito Dine in Treatment: 8 History of Present Illness HPI Description: 70 year old with a past medical history significant for [name], urinary incontinence, and obesity. She has been seen in the wound clinic before for lower extremity ulcerations treated with compression therapy. she is also known to have hypertension, peripheral vascular disease, COPD, obstructive sleep apnea, lumbar radiculopathy, kyphoscoliosis, urinary issues and tobacco abuse. Smokes a packet of cigarettes a day was recently seen at the [facility] for swelling of her legs and feet with a ulceration on the dorsum of the right foot which has been there for about 6 months. she was recently in the ER about a month ago where she was seen for shortness of breath and swelling of the legs and a chest x-ray was within normal limits had an increase in her BNP and was given Lasix and put on doxycycline for a mild cellulitis and possible UTI. Wounds aresmaller today 11/13/15. Debrided and will continue Santyl. 12/21/2015 -- she was admitted to the hospital overnight on 12/18/2015 and diagnosed with urinary retention and cellulitis of the left lower leg. is past to take clindamycin and use Santyl for her wounds. 01/15/2016 -- come back to see [place] for almost a month and continues to be noncompliant with her dressings 01/30/16 patient presents today for a follow-up visit concerning her ongoing bilateral lower extremity wounds. We have not seen her for the past 2 weeks although we are supposed to be seen her for weekly visits. She currently  has a Foley catheter. She tells me that the wounds are intensely painful especially with pressure and palpation at this point in time. Fortunately she is having no interval signs or symptoms of systemic infection but unfortunately the wounds have not improved dramatically since we last saw her. She does have home health coming to take care of her as well. She is currently not in any compression wraps. 02/06/16 ON evaluation today patient continues to experience discomfort regarding her bilateral lower extremity ulcers. She has continued to tolerate the dressing changes at this point in time and continues to have a Foley catheter as well. Fortunately she is back this week in the past she has been somewhat noncompliant with follow-ups I'm glad to see her today. She tells me that her pain level varies but can be as high as a 7 out of 10 with manipulation of the wound. She tells me that she used to be on oxycodone which was managed by the pain clinic although she is no longer on that as she tells me that she was actually smoking marijuana at the time and when this was found out they discontinued her pain medication. She no longer is taking anything pain medication wise and she also does not smoke cigarettes nor marijuana at this point. 02/12/16; this is a patient I don't believe I have previously seen. She has multiple sclerosis. She has 4 punched-out areas on the anterior lateral left leg 2 areas on the right these are all in the same condition. Reasonably small [dime size] wounds each was some depth. Surface of these wounds does not look particularly healthy as there is adherent slough. There is no evidence of surrounding infection or  inflammation. ABIs in this clinic were 0.87 on the right and 0.81 on the left. She is listed as having PAD and is a smoker. Not a diabetic 02/20/16 patient I gave antibiotics to last week for erythema around both wound sites on the left lateral leg and right lateral leg.  This appears to be a lot better. One of the areas on the left leg is healed however she still has 3 punched-out open areas on the left lateral calf and one on the right lateral calf and one on the dorsal foot. She is an ex-smoker quitting 1 month ago. ABIs in this clinic were 0.87 on the right 0.81 on the left 03/12/16; this is a patient I really don't have a good sense of. It would appear for the first 5 or 6 weeks of her stay in this clinic she was cared for by Dr. Con Memos. She appeared on my schedule in mid October and I saw her twice. She has not been here however in 3 weeks. She has advanced Wound Care at home where she lives with her husband. She has multiple sclerosis. I saw her the first time she had 4 punched-out small wounds on the anterior lateral left leg it appears that she is now down to 2. She also had wounds on the lateral and medial aspect of her right leg which were also small and punched out. The only one that remains is on the right lateral. Because of the nature of her wound I went ahead and ordered formal arterial studies this showed an ABI of 1 on the right and one on the left. TBI of 1.4 on the right and 0.79 on the left. She had normal waveforms. She was in the emergency room on 02/28/16; there is a noted edema of her left leg after a fall. She had a duplex ultrasound that showed no evidence for an acute DVT from the left groin to the popliteal fossa. The study was limited in the calf veins due to edema. Also noticeable that she had a left inguinal enlarged lymph node up to 6.2 cm. She also had a left knee x-ray that showed no acute findings and a right foot x-ray that showed soft tissue swelling but no radiographic evidence of osteomyelitis. The patient does stop smoking 03-19-16 [name] presents today for evaluation of her bilateral lower extremity ulcerations. She states that she has not smoked in several weeks. She denies any pain or discomfort to bilateral lower  extremities, tolerating compression therapy as ordered. 03/26/16; I have not seen this patient in 2 weeks however she has done very well with improvements in the areas in question. After we obtain normal arterial studies, increasing the 4-layer compression really seems to look done a nice job here. She has one open area on the left lateral leg and one on the medial right leg. These have filled in nicely and are now superficial wounds that showed epithelialized. I note the left inguinal lymph node at 6.2 cm. I may need to refer this back to the patient's primary doctor. 04/03/16; patient has 2 remaining wounds 1 on the left lateral leg and one on the right medial leg. Both of filled in nicely since we are able to increase her from a 3 layer to a 4 layer compression. I am also following up on the lymph node notable on her left leg DVT rule out in November 04/10/16; area on the right lateral leg is healed. The area on the left leg  is still open but looks considerably better with healthy granulation and less wound area. I12/20/17; last week we healed the patient doubt with regards to the wound on the right leg to her on compression stocking 20-30 mmHg as it turns out I don't think she had a stocking, predictably therefore she has reopened. The left leg as well as the wound on the lateral aspect. Been generally small line 04/24/16; we healed out her right leg 2 weeks ago to her own 20-30 mm compression stockings although as it turns out she didn't have these and did not purchase some apparently because of financial issues. The left leg wound is on the lateral aspect. Both of these wounds are just about healed. 05/01/16; her wounds on the left leg are healed out today. The area on the right leg is also healed. Vitamin diligent effort of our staff we have not been able to get any form of compression stocking to this patient. The orders for juxtalite's were given to home health this never materialized.  We Franek, Oneal H. ([id number]) ordered compression stockings I don't think she was able to afford these. We had a discussion today with both the patient and her husband without these, will accumulate edema and the wounds will simply reopen again. 05/14/16 READMISSION this is a patient we healed out 2 weeks ago. As bilateral lower extremity venous wounds probably some degree of lymphedema. At the beginning in November I did a duplex ultrasound of her left leg that was negative for DVT but did show a lymph node in the left inguinal area presumably reactive. We've been using compression to her lower extremities eventually healed all her wounds on her bilateral calves but we were not able to get any form of compression stockings for her in spite of intense efforts of our staff. She returns today with reopening on the left leg did not the right she has several superficial areas anteriorly but an actual frank ulcer on the lateral left calf. This is about the size of a dime or smaller. She does not really complain of pain. The patient has a history of PAD however arterial studies we did in November were normal. Her ABIs and Doppler waveforms were both within the normal limits. The patient has a history of [name]. In discussing things with her today it would appear that the opening was noted 2 weeks ago according to the patient. We gave her Tubigrip stockings when she left the clinic 05/21/16; she has not yet had the duplex ultrasound of her thigh, this is booked for Thursday. The wound on the left lateral lower leg is improved 05/28/16; her duplex ultrasound of the left leg specifically her left thigh did not show a DVT however did show hypoechogenic enlarged left inguinal lymph nodes up to 6.9 x 3.7 x 6.8. This also showed in the one in November however maximal diameter was 6.2, at that point I thought these were reactive secondary to cellulitis however there is obviously a differential here. Her wound on the  left lower leg is closed. She has a superficial open area on the base of her left fifth metatarsal head on the plantar foot. It looks as though she has some wrap injuries on the anterior leg 06/05/16; since she was last year she had a fall on 1/31. She was seen in the ER but sent back home. I think she is also been seen by her primary physician who picked up on the swelling and the lymphadenopathy. She is  ordered a CT scan of the abdomen and pelvis however this will be without contrast because of stage IIIB renal insufficiency. Nevertheless this should be a start the workup to make sure there is not is more systemic problem here. She will probably need consideration of a biopsy of the lymph node in her inguinal area, this would need general surgery. As far as her wounds are concern today the area on the left plantar foot is healed. She has a small weeping area on the left lower leg. The wrap injury from last week has largely Pardoxically there is a lot less edema in the left thigh. 06/12/16; CT scan is on Friday. She has 2 small open areas one on the lateral left lower leg and one on the anterior lower leg on the left. Both of these looks healthy. I reduced her compression last week to Kerlix and Coban this seems to have not a reasonable job 07/02/16; the patient is now in the skilled facility. I think this was arranged out of the ER when she fell at home although I'm not exactly sure. She is going for I think a laparotomy for the pelvic mass next week. All the wounds in the left leg have healed Readmission: 07/20/2020 upon evaluation today patient presents for reevaluation here in clinic unfortunately she is having issues with a significant stage III pressure ulcer to her left heel. This is something that occurred when she was in the hospital sick. And subsequently though she does not appear to have any new injury she is still trying to recover from the pressure injury which was quite severe during that  time. I do not see any signs of infection right now which is great news. With that being said she has been require some debridement to try to clear this up as much as possible. We need to get the wound VAC cleared so that she can see overall healing and improvement that were looking for. The patient does have a history of hypertension, chronic kidney disease stage III, COPD, and multiple sclerosis. Currently she has been trying to do what she can to keep pressure off of her heel while in bed and in other locations as well. 08/04/20 upon evaluation today patient appears to be doing extremely well currently in regard to her wound on the heel. I do believe the Iodoflex has helped loosen things appear. Unfortunately she tells me that the home health nurse has not been putting the Iodoflex on the wound. That is news to [place] and that they tell me they got supplies in the mail but they did not see the Iodoflex being used. Nonetheless I am wondering if this is just something that they missed or if it truly has not been. I did tell them to look when they get home in their box of supplies to see if they saw the dressing which showed and what it should look like. 08/11/2020 upon evaluation today patient appears to be doing decently well in regard to her wound although unfortunately this is showing signs of some blue-green drainage consistent with Pseudomonas it does have a little worried in this regard. Fortunately there is no evidence of active infection which is great news. No fevers, chills, nausea, vomiting, or diarrhea. 08/18/2020 upon evaluation today patient appears to be doing well with regard to her wound. She has been tolerating the dressing changes without complication. Fortunately there is no signs of active infection at this time. No fevers, chills, nausea, vomiting, or diarrhea.  4/29; left heel wound pressure ulcer. Slightly smaller. 09/04/2020 upon evaluation today patient appears to be doing well with  regard to her wound. This is actually on her heel and seems to be doing well other than the fact that is a little bit moist from excessive drainage. There does not appear to be any signs of active infection at this time. No fevers, chills, nausea, vomiting, or diarrhea.. 09/11/2020 upon evaluation today patient appears to be doing okay in regard to her wound. With that being said she still has quite a bit of drainage noted at this point. Fortunately there is no signs of active infection which is great news overall I am extremely pleased with where things stand today. I do think we need to try to do something a little bit more to help control the drainage a bit better. 5/23; left posterior heel. This actually looks quite good. Using Hydrofera Blue under compression. Dimensions are smaller Electronic Signature(s) Signed: 09/18/2020 3:42:13 PM By: Linton Ham MD Entered By: Linton Ham on 09/18/2020 15:32:40 [name] ([id number]) -------------------------------------------------------------------------------- Physical Exam Details Patient Name: [name] Date of Service: 09/18/2020 2:30 PM Medical Record Number: [id number] Patient Account Number: [id number] Date of Birth/Sex: May 05, 1950 (69 y.o. F) Treating RN: Primary Care Provider: [name] Other Clinician: Referring Provider: [name] Treating Provider/Extender: Tito Dine in Treatment: 8 Constitutional Sitting or standing Blood Pressure is within target range for patient.. Pulse regular and within target range for patient.Marland Kitchen Respirations regular, non- labored and within target range.. Temperature is normal and within the target range for the patient.Marland Kitchen appears in no distress. Cardiovascular Pedal pulses are palpable. Notes Wound exam; no evidence of active infection. The area looks really quite healthy even under illumination no debridement is required Electronic Signature(s) Signed:  09/18/2020 3:42:13 PM By: Linton Ham MD Entered By: Linton Ham on 09/18/2020 15:33:23 [name] ([id number]) -------------------------------------------------------------------------------- Physician Orders Details Patient Name: [name] Date of Service: 09/18/2020 2:30 PM Medical Record Number: [id number] Patient Account Number: [id number] Date of Birth/Sex: December 28, 1950 (69 y.o. F) Treating RN: Cornell Barman Primary Care Provider: [name] Other Clinician: Referring Provider: [name] Treating Provider/Extender: Tito Dine in Treatment: 8 Verbal / Phone Orders: No Diagnosis Coding Follow-up Appointments o Return Appointment in 1 week. [place]: - [facility] for wound care. May utilize formulary equivalent dressing for wound treatment orders unless otherwise specified. Home Health Nurse may visit PRN to address patientos wound care needs. o Scheduled days for dressing changes to be completed; exception, patient has scheduled wound care visit that day. - Mondays and Thursdays o **Please direct any NON-WOUND related issues/requests for orders to patient's Primary Care Physician. **If current dressing causes regression in wound condition, may D/C ordered dressing product/s and apply Normal Saline Moist Dressing daily until next [place] or Other MD appointment. **Notify [facility] of regression in wound condition at [phone]. Edema Control - Lymphedema / Segmental Compressive Device / Other Left Lower Extremity o Optional: One layer of unna paste to top of compression wrap (to act as an anchor). o Elevate, Exercise Daily and Avoid Standing for Long Periods of Time. o Elevate legs to the level of the heart and pump ankles as often as possible - ELEVATE LEGS o Elevate leg(s) parallel to the floor when sitting. Off-Loading Left Lower Extremity o Open  toe surgical shoe o Turn and reposition every 2 hours o Other: -  Keep pressure off left heel Wound Treatment Wound #12 - Calcaneus Wound Laterality: Left Cleanser: Normal Saline 2 x Per Week/30 Days Discharge Instructions: Wash your hands with soap and water. Remove old dressing, discard into plastic bag and place into trash. Cleanse the wound with Normal Saline prior to applying a clean dressing using gauze sponges, not tissues or cotton balls. Do not scrub or use excessive force. Pat dry using gauze sponges, not tissue or cotton balls. Peri-Wound Care: Desitin Maximum Strength Ointment 4 (oz) 2 x Per Week/30 Days Discharge Instructions: apply around perimeter of wound for protection Primary Dressing: Hydrofera Blue Ready Transfer Foam, 4x5 (in/in) (Generic) 2 x Per Week/30 Days Discharge Instructions: Apply Hydrofera Blue Ready to wound bed as directed Secondary Dressing: Xtrasorb Medium 4x5 (in/in) 2 x Per Week/30 Days Discharge Instructions: Apply to wound as directed. Do not cut. Compression Wrap: Profore Lite LF 3 Multilayer Compression Bandaging System 2 x Per Week/30 Days Discharge Instructions: Apply 3 multi-layer wrap as prescribed. Electronic Signature(s) Signed: 09/18/2020 3:42:13 PM By: Linton Ham MD Signed: 09/19/2020 5:46:47 PM By: Gretta Cool BSN, RN, CWS, Kim RN, BSN Entered By: Gretta Cool, BSN, RN, CWS, Kim on 09/18/2020 15:25:39 [name], [name] ([id number]) -------------------------------------------------------------------------------- Problem List Details Patient Name: [name] Date of Service: 09/18/2020 2:30 PM Medical Record Number: [id number] Patient Account Number: [id number] Date of Birth/Sex: Jan 01, 1951 (69 y.o. F) Treating RN: Primary Care Provider: [name] Other Clinician: Referring Provider: [name] Treating Provider/Extender: Tito Dine in Treatment: 8 Active Problems ICD-10 Encounter Code Description Active Date  MDM Diagnosis L89.623 Pressure ulcer of left heel, stage 3 07/20/2020 No Yes I10 Essential (primary) hypertension 07/20/2020 No Yes N18.30 Chronic kidney disease, stage 3 unspecified 07/20/2020 No Yes J44.9 Chronic obstructive pulmonary disease, unspecified 07/20/2020 No Yes G35 Multiple sclerosis 07/20/2020 No Yes Inactive Problems Resolved Problems Electronic Signature(s) Signed: 09/18/2020 3:42:13 PM By: Linton Ham MD Entered By: Linton Ham on 09/18/2020 15:32:03 [name] ([id number]) -------------------------------------------------------------------------------- Progress Note Details Patient Name: [name] Date of Service: 09/18/2020 2:30 PM Medical Record Number: [id number] Patient Account Number: [id number] Date of Birth/Sex: 1950/08/21 (69 y.o. F) Treating RN: Primary Care Provider: [name] Other Clinician: Referring Provider: [name] Treating Provider/Extender: Tito Dine in Treatment: 8 Subjective History of Present Illness (HPI) 70 year old with a past medical history significant for [name], urinary incontinence, and obesity. She has been seen in the wound clinic before for lower extremity ulcerations treated with compression therapy. she is also known to have hypertension, peripheral vascular disease, COPD, obstructive sleep apnea, lumbar radiculopathy, kyphoscoliosis, urinary issues and tobacco abuse. Smokes a packet of cigarettes a day was recently seen at the [facility] for swelling of her legs and feet with a ulceration on the dorsum of the right foot which has been there for about 6 months. she was recently in the ER about a month ago where she was seen for shortness of breath and swelling of the legs and a chest x-ray was within normal limits had an increase in her BNP and was given Lasix and put on doxycycline for a mild cellulitis and possible UTI. Wounds aresmaller today 11/13/15. Debrided and will continue  Santyl. 12/21/2015 -- she was admitted to the hospital overnight on 12/18/2015 and diagnosed with urinary retention and cellulitis of the left lower leg. is past to take clindamycin and use Santyl for her wounds. 01/15/2016 -- come back to see [place] for almost a month and continues to be noncompliant  with her dressings 01/30/16 patient presents today for a follow-up visit concerning her ongoing bilateral lower extremity wounds. We have not seen her for the past 2 weeks although we are supposed to be seen her for weekly visits. She currently has a Foley catheter. She tells me that the wounds are intensely painful especially with pressure and palpation at this point in time. Fortunately she is having no interval signs or symptoms of systemic infection but unfortunately the wounds have not improved dramatically since we last saw her. She does have home health coming to take care of her as well. She is currently not in any compression wraps. 02/06/16 ON evaluation today patient continues to experience discomfort regarding her bilateral lower extremity ulcers. She has continued to tolerate the dressing changes at this point in time and continues to have a Foley catheter as well. Fortunately she is back this week in the past she has been somewhat noncompliant with follow-ups I'm glad to see her today. She tells me that her pain level varies but can be as high as a 7 out of 10 with manipulation of the wound. She tells me that she used to be on oxycodone which was managed by the pain clinic although she is no longer on that as she tells me that she was actually smoking marijuana at the time and when this was found out they discontinued her pain medication. She no longer is taking anything pain medication wise and she also does not smoke cigarettes nor marijuana at this point. 02/12/16; this is a patient I don't believe I have previously seen. She has multiple sclerosis. She has 4 punched-out areas on the anterior  lateral left leg 2 areas on the right these are all in the same condition. Reasonably small [dime size] wounds each was some depth. Surface of these wounds does not look particularly healthy as there is adherent slough. There is no evidence of surrounding infection or inflammation. ABIs in this clinic were 0.87 on the right and 0.81 on the left. She is listed as having PAD and is a smoker. Not a diabetic 02/20/16 patient I gave antibiotics to last week for erythema around both wound sites on the left lateral leg and right lateral leg. This appears to be a lot better. One of the areas on the left leg is healed however she still has 3 punched-out open areas on the left lateral calf and one on the right lateral calf and one on the dorsal foot. She is an ex-smoker quitting 1 month ago. ABIs in this clinic were 0.87 on the right 0.81 on the left 03/12/16; this is a patient I really don't have a good sense of. It would appear for the first 5 or 6 weeks of her stay in this clinic she was cared for by Dr. Con Memos. She appeared on my schedule in mid October and I saw her twice. She has not been here however in 3 weeks. She has advanced Wound Care at home where she lives with her husband. She has multiple sclerosis. I saw her the first time she had 4 punched-out small wounds on the anterior lateral left leg it appears that she is now down to 2. She also had wounds on the lateral and medial aspect of her right leg which were also small and punched out. The only one that remains is on the right lateral. Because of the nature of her wound I went ahead and ordered formal arterial studies this showed an ABI  of 1 on the right and one on the left. TBI of 1.4 on the right and 0.79 on the left. She had normal waveforms. She was in the emergency room on 02/28/16; there is a noted edema of her left leg after a fall. She had a duplex ultrasound that showed no evidence for an acute DVT from the left groin to the popliteal  fossa. The study was limited in the calf veins due to edema. Also noticeable that she had a left inguinal enlarged lymph node up to 6.2 cm. She also had a left knee x-ray that showed no acute findings and a right foot x-ray that showed soft tissue swelling but no radiographic evidence of osteomyelitis. The patient does stop smoking 03-19-16 [name] Stimson presents today for evaluation of her bilateral lower extremity ulcerations. She states that she has not smoked in several weeks. She denies any pain or discomfort to bilateral lower extremities, tolerating compression therapy as ordered. 03/26/16; I have not seen this patient in 2 weeks however she has done very well with improvements in the areas in question. After we obtain normal arterial studies, increasing the 4-layer compression really seems to look done a nice job here. She has one open area on the left lateral leg and one on the medial right leg. These have filled in nicely and are now superficial wounds that showed epithelialized. I note the left inguinal lymph node at 6.2 cm. I may need to refer this back to the patient's primary doctor. 04/03/16; patient has 2 remaining wounds 1 on the left lateral leg and one on the right medial leg. Both of filled in nicely since we are able to increase her from a 3 layer to a 4 layer compression. I am also following up on the lymph node notable on her left leg DVT rule out in November 04/10/16; area on the right lateral leg is healed. The area on the left leg is still open but looks considerably better with healthy granulation and less wound area. I12/20/17; last week we healed the patient doubt with regards to the wound on the right leg to her on compression stocking 20-30 mmHg as it turns out I don't think she had a stocking, predictably therefore she has reopened. The left leg as well as the wound on the lateral aspect. Been generally small line 04/24/16; we healed out her right leg 2 weeks ago to her own  20-30 mm compression stockings although as it turns out she didn't have these and did not purchase some apparently because of financial issues. The left leg wound is on the lateral aspect. Both of these wounds are just about healed. 05/01/16; her wounds on the left leg are healed out today. The area on the right leg is also healed. Vitamin diligent effort of our staff we have not been able to get any form of compression stocking to this patient. The orders for juxtalite's were given to home health this never materialized. We ordered compression stockings I don't think she was able to afford these. We had a discussion today with both the patient and her husband without these, will accumulate edema and the wounds will simply reopen again. 05/14/16 READMISSION this is a patient we healed out 2 weeks ago. As bilateral lower extremity venous wounds probably some degree of lymphedema. At the beginning CHYANE, GREER ([id number]) in November I did a duplex ultrasound of her left leg that was negative for DVT but did show a lymph node in the  left inguinal area presumably reactive. We've been using compression to her lower extremities eventually healed all her wounds on her bilateral calves but we were not able to get any form of compression stockings for her in spite of intense efforts of our staff. She returns today with reopening on the left leg did not the right she has several superficial areas anteriorly but an actual frank ulcer on the lateral left calf. This is about the size of a dime or smaller. She does not really complain of pain. The patient has a history of PAD however arterial studies we did in November were normal. Her ABIs and Doppler waveforms were both within the normal limits. The patient has a history of [name]. In discussing things with her today it would appear that the opening was noted 2 weeks ago according to the patient. We gave her Tubigrip stockings when she left the clinic 05/21/16; she  has not yet had the duplex ultrasound of her thigh, this is booked for Thursday. The wound on the left lateral lower leg is improved 05/28/16; her duplex ultrasound of the left leg specifically her left thigh did not show a DVT however did show hypoechogenic enlarged left inguinal lymph nodes up to 6.9 x 3.7 x 6.8. This also showed in the one in November however maximal diameter was 6.2, at that point I thought these were reactive secondary to cellulitis however there is obviously a differential here. Her wound on the left lower leg is closed. She has a superficial open area on the base of her left fifth metatarsal head on the plantar foot. It looks as though she has some wrap injuries on the anterior leg 06/05/16; since she was last year she had a fall on 1/31. She was seen in the ER but sent back home. I think she is also been seen by her primary physician who picked up on the swelling and the lymphadenopathy. She is ordered a CT scan of the abdomen and pelvis however this will be without contrast because of stage IIIB renal insufficiency. Nevertheless this should be a start the workup to make sure there is not is more systemic problem here. She will probably need consideration of a biopsy of the lymph node in her inguinal area, this would need general surgery. As far as her wounds are concern today the area on the left plantar foot is healed. She has a small weeping area on the left lower leg. The wrap injury from last week has largely Pardoxically there is a lot less edema in the left thigh. 06/12/16; CT scan is on Friday. She has 2 small open areas one on the lateral left lower leg and one on the anterior lower leg on the left. Both of these looks healthy. I reduced her compression last week to Kerlix and Coban this seems to have not a reasonable job 07/02/16; the patient is now in the skilled facility. I think this was arranged out of the ER when she fell at home although I'm not exactly sure. She is  going for I think a laparotomy for the pelvic mass next week. All the wounds in the left leg have healed Readmission: 07/20/2020 upon evaluation today patient presents for reevaluation here in clinic unfortunately she is having issues with a significant stage III pressure ulcer to her left heel. This is something that occurred when she was in the hospital sick. And subsequently though she does not appear to have any new injury she is still trying  to recover from the pressure injury which was quite severe during that time. I do not see any signs of infection right now which is great news. With that being said she has been require some debridement to try to clear this up as much as possible. We need to get the wound VAC cleared so that she can see overall healing and improvement that were looking for. The patient does have a history of hypertension, chronic kidney disease stage III, COPD, and multiple sclerosis. Currently she has been trying to do what she can to keep pressure off of her heel while in bed and in other locations as well. 08/04/20 upon evaluation today patient appears to be doing extremely well currently in regard to her wound on the heel. I do believe the Iodoflex has helped loosen things appear. Unfortunately she tells me that the home health nurse has not been putting the Iodoflex on the wound. That is news to [place] and that they tell me they got supplies in the mail but they did not see the Iodoflex being used. Nonetheless I am wondering if this is just something that they missed or if it truly has not been. I did tell them to look when they get home in their box of supplies to see if they saw the dressing which showed and what it should look like. 08/11/2020 upon evaluation today patient appears to be doing decently well in regard to her wound although unfortunately this is showing signs of some blue-green drainage consistent with Pseudomonas it does have a little worried in this regard.  Fortunately there is no evidence of active infection which is great news. No fevers, chills, nausea, vomiting, or diarrhea. 08/18/2020 upon evaluation today patient appears to be doing well with regard to her wound. She has been tolerating the dressing changes without complication. Fortunately there is no signs of active infection at this time. No fevers, chills, nausea, vomiting, or diarrhea. 4/29; left heel wound pressure ulcer. Slightly smaller. 09/04/2020 upon evaluation today patient appears to be doing well with regard to her wound. This is actually on her heel and seems to be doing well other than the fact that is a little bit moist from excessive drainage. There does not appear to be any signs of active infection at this time. No fevers, chills, nausea, vomiting, or diarrhea.. 09/11/2020 upon evaluation today patient appears to be doing okay in regard to her wound. With that being said she still has quite a bit of drainage noted at this point. Fortunately there is no signs of active infection which is great news overall I am extremely pleased with where things stand today. I do think we need to try to do something a little bit more to help control the drainage a bit better. 5/23; left posterior heel. This actually looks quite good. Using Hydrofera Blue under compression. Dimensions are smaller Objective Constitutional Sitting or standing Blood Pressure is within target range for patient.. Pulse regular and within target range for patient.Marland Kitchen Respirations regular, non- labored and within target range.. Temperature is normal and within the target range for the patient.Marland Kitchen appears in no distress. Vitals Time Taken: 2:56 AM, Height: 64 in, Weight: 265 lbs, BMI: 45.5, Temperature: 98.1 F, Pulse: 70 bpm, Respiratory Rate: 18 breaths/min, Blood Pressure: 121/72 mmHg. Cardiovascular Pedal pulses are palpable. [name], [name] ([id number]) General Notes: Wound exam; no evidence of active infection. The  area looks really quite healthy even under illumination no debridement is required Integumentary (Hair, Skin)  Wound #12 status is Open. Original cause of wound was Pressure Injury. The date acquired was: 06/27/2020. The wound has been in treatment 8 weeks. The wound is located on the Left Calcaneus. The wound measures 1.5cm length x 2.2cm width x 0.2cm depth; 2.592cm^2 area and 0.518cm^3 volume. There is Fat Layer (Subcutaneous Tissue) exposed. There is no tunneling or undermining noted. There is a medium amount of serosanguineous drainage noted. The wound margin is thickened. There is medium (34-66%) red, pink, hyper - granulation within the wound bed. There is a small (1-33%) amount of necrotic tissue within the wound bed including Adherent Slough. Assessment Active Problems ICD-10 Pressure ulcer of left heel, stage 3 Essential (primary) hypertension Chronic kidney disease, stage 3 unspecified Chronic obstructive pulmonary disease, unspecified Multiple sclerosis Procedures Wound #12 Pre-procedure diagnosis of Wound #12 is a Pressure Ulcer located on the Left Calcaneus . There was a Three Layer Compression Therapy Procedure with a pre-treatment ABI of 1.1 by Cornell Barman, RN. Post procedure Diagnosis Wound #12: Same as Pre-Procedure Plan Follow-up Appointments: Return Appointment in 1 week. Home Health: [facility] for wound care. May utilize formulary equivalent dressing for wound treatment orders unless otherwise specified. Home Health Nurse may visit PRN to address patient s wound care needs. Scheduled days for dressing changes to be completed; exception, patient has scheduled wound care visit that day. - Mondays and Thursdays **Please direct any NON-WOUND related issues/requests for orders to patient's Primary Care Physician. **If current dressing causes regression in wound condition, may D/C ordered dressing product/s and apply Normal Saline  Moist Dressing daily until next [place] [facility] or Other MD appointment. **Notify Wound Healing Center of regression in wound condition at [phone]. Edema Control - Lymphedema / Segmental Compressive Device / Other: Optional: One layer of unna paste to top of compression wrap (to act as an anchor). Elevate, Exercise Daily and Avoid Standing for Long Periods of Time. Elevate legs to the level of the heart and pump ankles as often as possible - ELEVATE LEGS Elevate leg(s) parallel to the floor when sitting. Off-Loading: Open toe surgical shoe Turn and reposition every 2 hours Other: - Keep pressure off left heel WOUND #12: - Calcaneus Wound Laterality: Left Cleanser: Normal Saline 2 x Per Week/30 Days Discharge Instructions: Wash your hands with soap and water. Remove old dressing, discard into plastic bag and place into trash. Cleanse the wound with Normal Saline prior to applying a clean dressing using gauze sponges, not tissues or cotton balls. Do not scrub or use excessive force. Pat dry using gauze sponges, not tissue or cotton balls. Peri-Wound Care: Desitin Maximum Strength Ointment 4 (oz) 2 x Per Week/30 Days Discharge Instructions: apply around perimeter of wound for protection Primary Dressing: Hydrofera Blue Ready Transfer Foam, 4x5 (in/in) (Generic) 2 x Per Week/30 Days Discharge Instructions: Apply Hydrofera Blue Ready to wound bed as directed Secondary Dressing: Xtrasorb Medium 4x5 (in/in) 2 x Per Week/30 Days Discharge Instructions: Apply to wound as directed. Do not cut. Compression Wrap: Profore Lite LF 3 Multilayer Compression Bandaging System 2 x Per Week/30 Days Discharge Instructions: Apply 3 multi-layer wrap as prescribed. [name], [name] H. ([id number]) 1. Continue with Hydrofera Blue/Xtrasorb/3 layer compression 2. Dimensions are measurably smaller. Wound surface looks healthy. 3. The patient was advised to keep the pressure off this area as much as  possible Electronic Signature(s) Signed: 09/18/2020 3:42:13 PM By: Linton Ham MD Entered By: Linton Ham on 09/18/2020 15:34:09 Arriola, Inez Catalina  Lemmie Evens ([id number]) -------------------------------------------------------------------------------- SuperBill Details Patient Name: [name] Date of Service: 09/18/2020 Medical Record Number: [id number] Patient Account Number: [id number] Date of Birth/Sex: 06-16-1950 (69 y.o. F) Treating RN: Cornell Barman Primary Care Provider: [name] Other Clinician: Referring Provider: [name] Treating Provider/Extender: Tito Dine in Treatment: 8 Diagnosis Coding ICD-10 Codes Code Description [phone] Pressure ulcer of left heel, stage 3 I10 Essential (primary) hypertension N18.30 Chronic kidney disease, stage 3 unspecified J44.9 Chronic obstructive pulmonary disease, unspecified G35 Multiple sclerosis Facility Procedures CPT4 Code: 59163846 Description: (Facility Use Only) [phone] - [place] LWR LT LEG Modifier: Quantity: 1 Physician Procedures CPT4 Code: 0177939 Description: 03009 - WC PHYS LEVEL 3 - EST PT Modifier: Quantity: 1 CPT4 Code: Description: ICD-10 Diagnosis Description L89.623 Pressure ulcer of left heel, stage 3 Modifier: Quantity: Electronic Signature(s) Signed: 09/18/2020 3:42:13 PM By: Linton Ham MD Entered By: Linton Ham on 09/18/2020 15:34:28

## 2020-09-21 NOTE — Progress Notes (Signed)
[name], [name] ([id number]) Visit Report for 09/18/2020 Arrival Information Details Patient Name: [name], [name] Date of Service: 09/18/2020 2:30 PM Medical Record Number: [id number] Patient Account Number: [id number] Date of Birth/Sex: [birth year] (69 y.o. F) Treating RN: Primary Care Abdirizak Richison: [name] Other Clinician: Referring Kaycee Mcgaugh: [name] Treating Anderia Lorenzo/Extender: Tito Dine in Treatment: 8 Visit Information History Since Last Visit Added or deleted any medications: No Patient Arrived: Wheel Chair Had a fall or experienced change in No Arrival Time: 14:55 activities of daily living that may affect Accompanied By: husband in lobby risk of falls: Transfer Assistance: None Hospitalized since last visit: No Patient Identification Verified: Yes Pain Present Now: No Secondary Verification Process Completed: Yes Patient Requires Transmission-Based Precautions: No Patient Has Alerts: Yes Electronic Signature(s) Signed: 09/21/2020 4:37:07 PM By: [name] Entered By: [name] on 09/18/2020 14:56:49 Tillis, [name] ([id number]) -------------------------------------------------------------------------------- Compression Therapy Details Patient Name: [name] Date of Service: 09/18/2020 2:30 PM Medical Record Number: [id number] Patient Account Number: [id number] Date of Birth/Sex: Dec 22, 1950 (69 y.o. F) Treating RN: Cornell Barman Primary Care Adrea Sherpa: [name] Other Clinician: Referring Yoav Okane: [name] Treating Jaycion Treml/Extender: Tito Dine in Treatment: 8 Compression Therapy Performed for Wound Assessment: Wound #12 Left Calcaneus Performed By: Clinician Cornell Barman, RN Compression Type: Three Layer Pre Treatment ABI: 1.1 Post Procedure Diagnosis Same as Pre-procedure Electronic Signature(s) Signed: 09/19/2020 5:46:47 PM By: Gretta Cool, BSN, RN, CWS, Kim RN, BSN Entered By: Gretta Cool, BSN, RN, CWS, Kim  on 09/18/2020 15:24:05 [name] ([id number]) -------------------------------------------------------------------------------- Encounter Discharge Information Details Patient Name: [name] Date of Service: 09/18/2020 2:30 PM Medical Record Number: [id number] Patient Account Number: [id number] Date of Birth/Sex: [birth year] (69 y.o. F) Treating RN: Primary Care Shatina Streets: [name] Other Clinician: Referring Trammell Bowden: [name] Treating Siomara Burkel/Extender: Tito Dine in Treatment: 8 Encounter Discharge Information Items Discharge Condition: Stable Ambulatory Status: Wheelchair Discharge Destination: Home Transportation: Private Auto Accompanied By: husband in lobby Schedule Follow-up Appointment: Yes Clinical Summary of Care: Electronic Signature(s) Signed: 09/21/2020 4:37:07 PM By: [name] Entered By: [name] on 09/18/2020 15:42:56 [name] ([id number]) -------------------------------------------------------------------------------- Lower Extremity Assessment Details Patient Name: [name] Date of Service: 09/18/2020 2:30 PM Medical Record Number: [id number] Patient Account Number: [id number] Date of Birth/Sex: Mar 21, 1951 (69 y.o. F) Treating RN: Primary Care Harlan Vinal: [name] Other Clinician: Referring Charbel Los: [name] Treating Page Pucciarelli/Extender: Tito Dine in Treatment: 8 Edema Assessment Assessed: [Left: No] [Right: No] [Left: Edema] [Right: :] Calf Left: Right: Point of Measurement: 36 cm From Medial Instep 32.8 cm Ankle Left: Right: Point of Measurement: 10 cm From Medial Instep 19.7 cm Vascular Assessment Pulses: Dorsalis Pedis Palpable: [Left:Yes] Electronic Signature(s) Signed: 09/21/2020 4:37:07 PM By: [name] Entered By: [name] on 09/18/2020 15:11:45 Cancel, [name]  ([id number]) -------------------------------------------------------------------------------- Multi Wound Chart Details Patient Name: [name] Date of Service: 09/18/2020 2:30 PM Medical Record Number: [id number] Patient Account Number: [id number] Date of Birth/Sex: 1950/06/07 (69 y.o. F) Treating RN: Cornell Barman Primary Care Marylou Wages: [name] Other Clinician: Referring Sharisa Toves: [name] Treating Zaryiah Barz/Extender: Tito Dine in Treatment: 8 Vital Signs Height(in): 100 Pulse(bpm): 70 Weight(lbs): 265 Blood Pressure(mmHg): 121/72 Body Mass Index(BMI): 45 Temperature(F): 98.1 Respiratory Rate(breaths/min): 18 Photos: [N/A:N/A] Wound Location: Left Calcaneus N/A N/A Wounding Event: Pressure Injury N/A N/A Primary Etiology: Pressure Ulcer N/A N/A Comorbid History: Cataracts, Chronic Obstructive N/A N/A Pulmonary Disease (COPD), Sleep Apnea, Hypertension, Osteoarthritis, Neuropathy Date Acquired: 06/27/2020 N/A N/A [place]ella [place]  of Treatment: 8 N/A N/A Wound Status: Open N/A N/A Measurements L x W x D (cm) 1.5x2.2x0.2 N/A N/A Area (cm) : 2.592 N/A N/A Volume (cm) : 0.518 N/A N/A % Reduction in Area: 49.20% N/A N/A % Reduction in Volume: 49.30% N/A N/A Classification: Category/Stage III N/A N/A Exudate Amount: Medium N/A N/A Exudate Type: Serosanguineous N/A N/A Exudate Color: red, brown N/A N/A Wound Margin: Thickened N/A N/A Granulation Amount: Medium (34-66%) N/A N/A Granulation Quality: Red, Pink, Hyper-granulation N/A N/A Necrotic Amount: Small (1-33%) N/A N/A Exposed Structures: Fat Layer (Subcutaneous Tissue): N/A N/A Yes Fascia: No Tendon: No Muscle: No Joint: No Bone: No Epithelialization: None N/A N/A Procedures Performed: Compression Therapy N/A N/A Treatment Notes Electronic Signature(s) Signed: 09/18/2020 3:42:13 PM By: Linton Ham MD Entered By: Linton Ham on 09/18/2020 15:32:10 [place]erico, [name] ([id number]) DARNETTE, LAMPRON ([id number]) -------------------------------------------------------------------------------- Multi-Disciplinary Care Plan Details Patient Name: [name] Date of Service: 09/18/2020 2:30 PM Medical Record Number: [id number] Patient Account Number: [id number] Date of Birth/Sex: Dec 15, 1950 (69 y.o. F) Treating RN: Cornell Barman Primary Care Oryan Winterton: [name] Other Clinician: Referring Shaquisha Wynn: [name] Treating Jermy Couper/Extender: Tito Dine in Treatment: 8 Active Inactive Wound/Skin Impairment Nursing Diagnoses: Impaired tissue integrity Goals: Patient/caregiver will verbalize understanding of skin care regimen Date Initiated: 07/20/2020 Date Inactivated: 09/04/2020 Target Resolution Date: 09/19/2020 Goal Status: Met Ulcer/skin breakdown will have a volume reduction of 30% by week 4 Date Initiated: 07/20/2020 Date Inactivated: 08/25/2020 Target Resolution Date: 08/20/2020 Goal Status: Met Ulcer/skin breakdown will have a volume reduction of 50% by week 8 Date Initiated: 07/20/2020 Target Resolution Date: 09/19/2020 Goal Status: Active Ulcer/skin breakdown will have a volume reduction of 80% by week 12 Date Initiated: 07/20/2020 Target Resolution Date: 10/20/2020 Goal Status: Active Ulcer/skin breakdown will heal within 14 weeks Date Initiated: 07/20/2020 Target Resolution Date: 11/19/2020 Goal Status: Active Interventions: Assess patient/caregiver ability to obtain necessary supplies Assess patient/caregiver ability to perform ulcer/skin care regimen upon admission and as needed Provide education on ulcer and skin care Treatment Activities: Referred to DME Amaris Delafuente for dressing supplies : 07/20/2020 Skin care regimen initiated : 07/20/2020 Notes: Electronic Signature(s) Signed: 09/19/2020 5:46:47 PM By: Gretta Cool, BSN, RN, CWS, Kim RN, BSN Entered By: Gretta Cool, BSN, RN, CWS, Kim on 09/18/2020 15:23:15 [name]  ([id number]) -------------------------------------------------------------------------------- Pain Assessment Details Patient Name: [name] Date of Service: 09/18/2020 2:30 PM Medical Record Number: [id number] Patient Account Number: [id number] Date of Birth/Sex: Oct 30, 1950 (69 y.o. F) Treating RN: Primary Care Demetrice Combes: [name] Other Clinician: Referring Trevia Nop: [name] Treating Essance Gatti/Extender: Tito Dine in Treatment: 8 Active Problems Location of Pain Severity and Description of Pain Patient Has Paino No Site Locations Rate the pain. Current Pain Level: 0 Pain Management and Medication Current Pain Management: Electronic Signature(s) Signed: 09/21/2020 4:37:07 PM By: [name] Entered By: [name] on 09/18/2020 14:59:58 [name] ([id number]) -------------------------------------------------------------------------------- Patient/Caregiver Education Details Patient Name: [name] Date of Service: 09/18/2020 2:30 PM Medical Record Number: [id number] Patient Account Number: [id number] Date of Birth/Gender: 04/09/1951 (69 y.o. F) Treating RN: Cornell Barman Primary Care Physician: [name] Other Clinician: Referring Physician: [name] Treating Physician/Extender: Tito Dine in Treatment: 8 Education Assessment Education Provided To: Patient Education Topics Provided Wound/Skin Impairment: Handouts: Caring for Your Ulcer Methods: Demonstration, Explain/Verbal Responses: State content correctly Electronic Signature(s) Signed: 09/19/2020 5:46:47 PM By: Gretta Cool, BSN, RN, CWS, Kim RN, BSN Entered By: Gretta Cool, BSN, RN, CWS, Kim on 09/18/2020 15:26:10 Gerads, Inez Catalina  Lemmie Evens ([id number]) -------------------------------------------------------------------------------- Wound Assessment Details Patient Name: JNAE, THOMASTON Date of Service: 09/18/2020 2:30 PM Medical Record Number: [id number] Patient  Account Number: [id number] Date of Birth/Sex: April 25, 1951 (69 y.o. F) Treating RN: Primary Care Arnice Vanepps: [name] Other Clinician: Referring Lorne Winkels: [name] Treating Muriel Wilber/Extender: Tito Dine in Treatment: 8 Wound Status Wound Number: 12 Primary Pressure Ulcer Etiology: Wound Location: Left Calcaneus Wound Open Wounding Event: Pressure Injury Status: Date Acquired: 06/27/2020 Comorbid Cataracts, Chronic Obstructive Pulmonary Disease Weeks Of Treatment: 8 History: (COPD), Sleep Apnea, Hypertension, Osteoarthritis, Clustered Wound: No Neuropathy Photos Wound Measurements Length: (cm) 1.5 Width: (cm) 2.2 Depth: (cm) 0.2 Area: (cm) 2.592 Volume: (cm) 0.518 % Reduction in Area: 49.2% % Reduction in Volume: 49.3% Epithelialization: None Tunneling: No Undermining: No Wound Description Classification: Category/Stage III Wound Margin: Thickened Exudate Amount: Medium Exudate Type: Serosanguineous Exudate Color: red, brown Foul Odor After Cleansing: No Slough/Fibrino Yes Wound Bed Granulation Amount: Medium (34-66%) Exposed Structure Granulation Quality: Red, Pink, Hyper-granulation Fascia Exposed: No Necrotic Amount: Small (1-33%) Fat Layer (Subcutaneous Tissue) Exposed: Yes Necrotic Quality: Adherent Slough Tendon Exposed: No Muscle Exposed: No Joint Exposed: No Bone Exposed: No Treatment Notes Wound #12 (Calcaneus) Wound Laterality: Left Cleanser Normal Saline Discharge Instruction: Wash your hands with soap and water. Remove old dressing, discard into plastic bag and place into trash. Cleanse the wound with Normal Saline prior to applying a clean dressing using gauze sponges, not tissues or cotton balls. Do not Cambria, Keina H. ([id number]) scrub or use excessive force. Pat dry using gauze sponges, not tissue or cotton balls. Peri-Wound Care Desitin Maximum Strength Ointment 4 (oz) Discharge Instruction: apply around perimeter  of wound for protection Topical Primary Dressing Hydrofera Blue Ready Transfer Foam, 4x5 (in/in) Discharge Instruction: Apply Hydrofera Blue Ready to wound bed as directed Secondary Dressing Xtrasorb Medium 4x5 (in/in) Discharge Instruction: Apply to wound as directed. Do not cut. Secured With Compression Wrap Profore Lite LF 3 Multilayer Compression Bandaging System Discharge Instruction: Apply 3 multi-layer wrap as prescribed. Compression Stockings Add-Ons Electronic Signature(s) Signed: 09/21/2020 4:37:07 PM By: [name] Entered By: [name] on 09/18/2020 15:10:05 [name] ([id number]) -------------------------------------------------------------------------------- Vitals Details Patient Name: [name] Date of Service: 09/18/2020 2:30 PM Medical Record Number: [id number] Patient Account Number: [id number] Date of Birth/Sex: 1950-10-10 (69 y.o. F) Treating RN: Primary Care Markeis Allman: [name] Other Clinician: Referring Deneshia Zucker: [name] Treating Camari Quintanilla/Extender: Tito Dine in Treatment: 8 Vital Signs Time Taken: 02:56 Temperature (F): 98.1 Height (in): 64 Pulse (bpm): 70 Weight (lbs): 265 Respiratory Rate (breaths/min): 18 Body Mass Index (BMI): 45.5 Blood Pressure (mmHg): 121/72 Reference Range: 80 - 120 mg / dl Electronic Signature(s) Signed: 09/21/2020 4:37:07 PM By: [name] Entered By: [name] on 09/18/2020 14:59:37

## 2020-09-26 ENCOUNTER — Other Ambulatory Visit: Payer: Self-pay

## 2020-09-26 ENCOUNTER — Encounter: Payer: Medicare Other | Admitting: Physician Assistant

## 2020-09-26 DIAGNOSIS — L89623 Pressure ulcer of left heel, stage 3: Secondary | ICD-10-CM | POA: Diagnosis not present

## 2020-09-26 NOTE — Progress Notes (Addendum)
FERGIE, SHERBERT ([id number]) Visit Report for 09/26/2020 Chief Complaint Document Details Patient Name: [name], [name] Date of Service: 09/26/2020 3:00 PM Medical Record Number: [id number] Patient Account Number: [id number] Date of Birth/Sex: 1951/02/27 (70 y.o. F) Treating RN: [name] Primary Care Provider: [name] Other Clinician: Referring Provider: [name] Treating Provider/Extender: [name] in Treatment: 9 Information Obtained from: Patient Chief Complaint Left Heel Pressure Ulcer Electronic Signature(s) Signed: 09/26/2020 3:15:49 PM By: Worthy Keeler PA-C Entered By: Worthy Keeler on 09/26/2020 15:15:49 [name] ([id number]) -------------------------------------------------------------------------------- HPI Details Patient Name: [name] Date of Service: 09/26/2020 3:00 PM Medical Record Number: [id number] Patient Account Number: [id number] Date of Birth/Sex: 06-Mar-1951 (70 y.o. F) Treating RN: [name] Primary Care Provider: [name] Other Clinician: Referring Provider: [name] Treating Provider/Extender: [name] in Treatment: 9 History of Present Illness HPI Description: 70 year old with a past medical history significant for MS, urinary incontinence, and obesity. She has been seen in the wound clinic before for lower extremity ulcerations treated with compression therapy. she is also known to have hypertension, peripheral vascular disease, COPD, obstructive sleep apnea, lumbar radiculopathy, kyphoscoliosis, urinary issues and tobacco abuse. Smokes a packet of cigarettes a day was recently seen at the [facility] for swelling of her legs and feet with a ulceration on the dorsum of the right foot which has been there for about 6 months. she was recently in the ER about a month ago where she was seen for shortness of breath and swelling of the legs and a chest x-ray was within  normal limits had an increase in her BNP and was given Lasix and put on doxycycline for a mild cellulitis and possible UTI. Wounds aresmaller today 11/13/15. Debrided and will continue Santyl. 12/21/2015 -- she was admitted to the hospital overnight on 12/18/2015 and diagnosed with urinary retention and cellulitis of the left lower leg. is past to take clindamycin and use Santyl for her wounds. 01/15/2016 -- come back to see [place] for almost a month and continues to be noncompliant with her dressings 01/30/16 patient presents today for a follow-up visit concerning her ongoing bilateral lower extremity wounds. We have not seen her for the past 2 weeks although we are supposed to be seen her for weekly visits. She currently has a Foley catheter. She tells me that the wounds are intensely painful especially with pressure and palpation at this point in time. Fortunately she is having no interval signs or symptoms of systemic infection but unfortunately the wounds have not improved dramatically since we last saw her. She does have home health coming to take care of her as well. She is currently not in any compression wraps. 02/06/16 ON evaluation today patient continues to experience discomfort regarding her bilateral lower extremity ulcers. She has continued to tolerate the dressing changes at this point in time and continues to have a Foley catheter as well. Fortunately she is back this week in the past she has been somewhat noncompliant with follow-ups I'm glad to see her today. She tells me that her pain level varies but can be as high as a 7 out of 10 with manipulation of the wound. She tells me that she used to be on oxycodone which was managed by the pain clinic although she is no longer on that as she tells me that she was actually smoking marijuana at the time and when this was found out they discontinued her pain medication. She no longer  is taking anything pain medication wise and she also does not  smoke cigarettes nor marijuana at this point. 02/12/16; this is a patient I don't believe I have previously seen. She has multiple sclerosis. She has 4 punched-out areas on the anterior lateral left leg 2 areas on the right these are all in the same condition. Reasonably small [dime size] wounds each was some depth. Surface of these wounds does not look particularly healthy as there is adherent slough. There is no evidence of surrounding infection or inflammation. ABIs in this clinic were 0.87 on the right and 0.81 on the left. She is listed as having PAD and is a smoker. Not a diabetic 02/20/16 patient I gave antibiotics to last week for erythema around both wound sites on the left lateral leg and right lateral leg. This appears to be a lot better. One of the areas on the left leg is healed however she still has 3 punched-out open areas on the left lateral calf and one on the right lateral calf and one on the dorsal foot. She is an ex-smoker quitting 1 month ago. ABIs in this clinic were 0.87 on the right 0.81 on the left 03/12/16; this is a patient I really don't have a good sense of. It would appear for the first 5 or 6 weeks of her stay in this clinic she was cared for by Dr. Con Memos. She appeared on my schedule in mid October and I saw her twice. She has not been here however in 3 weeks. She has advanced Wound Care at home where she lives with her husband. She has multiple sclerosis. I saw her the first time she had 4 punched-out small wounds on the anterior lateral left leg it appears that she is now down to 2. She also had wounds on the lateral and medial aspect of her right leg which were also small and punched out. The only one that remains is on the right lateral. Because of the nature of her wound I went ahead and ordered formal arterial studies this showed an ABI of 1 on the right and one on the left. TBI of 1.4 on the right and 0.79 on the left. She had normal waveforms. She was in the  emergency room on 02/28/16; there is a noted edema of her left leg after a fall. She had a duplex ultrasound that showed no evidence for an acute DVT from the left groin to the popliteal fossa. The study was limited in the calf veins due to edema. Also noticeable that she had a left inguinal enlarged lymph node up to 6.2 cm. She also had a left knee x-ray that showed no acute findings and a right foot x-ray that showed soft tissue swelling but no radiographic evidence of osteomyelitis. The patient does stop smoking 03-19-16 Ms Beaupre presents today for evaluation of her bilateral lower extremity ulcerations. She states that she has not smoked in several weeks. She denies any pain or discomfort to bilateral lower extremities, tolerating compression therapy as ordered. 03/26/16; I have not seen this patient in 2 weeks however she has done very well with improvements in the areas in question. After we obtain normal arterial studies, increasing the 4-layer compression really seems to look done a nice job here. She has one open area on the left lateral leg and one on the medial right leg. These have filled in nicely and are now superficial wounds that showed epithelialized. I note the left inguinal lymph node at  6.2 cm. I may need to refer this back to the patient's primary doctor. 04/03/16; patient has 2 remaining wounds 1 on the left lateral leg and one on the right medial leg. Both of filled in nicely since we are able to increase her from a 3 layer to a 4 layer compression. I am also following up on the lymph node notable on her left leg DVT rule out in November 04/10/16; area on the right lateral leg is healed. The area on the left leg is still open but looks considerably better with healthy granulation and less wound area. I12/20/17; last week we healed the patient doubt with regards to the wound on the right leg to her on compression stocking 20-30 mmHg as it turns out I don't think she had a stocking,  predictably therefore she has reopened. The left leg as well as the wound on the lateral aspect. Been generally small line 04/24/16; we healed out her right leg 2 weeks ago to her own 20-30 mm compression stockings although as it turns out she didn't have these and did not purchase some apparently because of financial issues. The left leg wound is on the lateral aspect. Both of these wounds are just about healed. 05/01/16; her wounds on the left leg are healed out today. The area on the right leg is also healed. Vitamin diligent effort of our staff we have not been able to get any form of compression stocking to this patient. The orders for juxtalite's were given to home health this never materialized. We ordered compression stockings I don't think she was able to afford these. We had a discussion today with both the patient and her husband without these, will accumulate edema and the wounds will simply reopen again. 05/14/16 READMISSION [place], [place] ([id number]) this is a patient we healed out 2 weeks ago. As bilateral lower extremity venous wounds probably some degree of lymphedema. At the beginning in November I did a duplex ultrasound of her left leg that was negative for DVT but did show a lymph node in the left inguinal area presumably reactive. We've been using compression to her lower extremities eventually healed all her wounds on her bilateral calves but we were not able to get any form of compression stockings for her in spite of intense efforts of our staff. She returns today with reopening on the left leg did not the right she has several superficial areas anteriorly but an actual frank ulcer on the lateral left calf. This is about the size of a dime or smaller. She does not really complain of pain. The patient has a history of PAD however arterial studies we did in November were normal. Her ABIs and Doppler waveforms were both within the normal limits. The patient has a history of MS.  In discussing things with her today it would appear that the opening was noted 2 weeks ago according to the patient. We gave her Tubigrip stockings when she left the clinic 05/21/16; she has not yet had the duplex ultrasound of her thigh, this is booked for Thursday. The wound on the left lateral lower leg is improved 05/28/16; her duplex ultrasound of the left leg specifically her left thigh did not show a DVT however did show hypoechogenic enlarged left inguinal lymph nodes up to 6.9 x 3.7 x 6.8. This also showed in the one in November however maximal diameter was 6.2, at that point I thought these were reactive secondary to cellulitis however there is obviously  a differential here. Her wound on the left lower leg is closed. She has a superficial open area on the base of her left fifth metatarsal head on the plantar foot. It looks as though she has some wrap injuries on the anterior leg 06/05/16; since she was last year she had a fall on 1/31. She was seen in the ER but sent back home. I think she is also been seen by her primary physician who picked up on the swelling and the lymphadenopathy. She is ordered a CT scan of the abdomen and pelvis however this will be without contrast because of stage IIIB renal insufficiency. Nevertheless this should be a start the workup to make sure there is not is more systemic problem here. She will probably need consideration of a biopsy of the lymph node in her inguinal area, this would need general surgery. As far as her wounds are concern today the area on the left plantar foot is healed. She has a small weeping area on the left lower leg. The wrap injury from last week has largely Pardoxically there is a lot less edema in the left thigh. 06/12/16; CT scan is on Friday. She has 2 small open areas one on the lateral left lower leg and one on the anterior lower leg on the left. Both of these looks healthy. I reduced her compression last week to Kerlix and Coban this  seems to have not a reasonable job 07/02/16; the patient is now in the skilled facility. I think this was arranged out of the ER when she fell at home although I'm not exactly sure. She is going for I think a laparotomy for the pelvic mass next week. All the wounds in the left leg have healed Readmission: 07/20/2020 upon evaluation today patient presents for reevaluation here in clinic unfortunately she is having issues with a significant stage III pressure ulcer to her left heel. This is something that occurred when she was in the hospital sick. And subsequently though she does not appear to have any new injury she is still trying to recover from the pressure injury which was quite severe during that time. I do not see any signs of infection right now which is great news. With that being said she has been require some debridement to try to clear this up as much as possible. We need to get the wound VAC cleared so that she can see overall healing and improvement that were looking for. The patient does have a history of hypertension, chronic kidney disease stage III, COPD, and multiple sclerosis. Currently she has been trying to do what she can to keep pressure off of her heel while in bed and in other locations as well. 08/04/20 upon evaluation today patient appears to be doing extremely well currently in regard to her wound on the heel. I do believe the Iodoflex has helped loosen things appear. Unfortunately she tells me that the home health nurse has not been putting the Iodoflex on the wound. That is news to [place] and that they tell me they got supplies in the mail but they did not see the Iodoflex being used. Nonetheless I am wondering if this is just something that they missed or if it truly has not been. I did tell them to look when they get home in their box of supplies to see if they saw the dressing which showed and what it should look like. 08/11/2020 upon evaluation today patient appears to be  doing decently  well in regard to her wound although unfortunately this is showing signs of some blue-green drainage consistent with Pseudomonas it does have a little worried in this regard. Fortunately there is no evidence of active infection which is great news. No fevers, chills, nausea, vomiting, or diarrhea. 08/18/2020 upon evaluation today patient appears to be doing well with regard to her wound. She has been tolerating the dressing changes without complication. Fortunately there is no signs of active infection at this time. No fevers, chills, nausea, vomiting, or diarrhea. 4/29; left heel wound pressure ulcer. Slightly smaller. 09/04/2020 upon evaluation today patient appears to be doing well with regard to her wound. This is actually on her heel and seems to be doing well other than the fact that is a little bit moist from excessive drainage. There does not appear to be any signs of active infection at this time. No fevers, chills, nausea, vomiting, or diarrhea.. 09/11/2020 upon evaluation today patient appears to be doing okay in regard to her wound. With that being said she still has quite a bit of drainage noted at this point. Fortunately there is no signs of active infection which is great news overall I am extremely pleased with where things stand today. I do think we need to try to do something a little bit more to help control the drainage a bit better. 5/23; left posterior heel. This actually looks quite good. Using Hydrofera Blue under compression. Dimensions are smaller 09/26/2020 upon evaluation today patient appears to be doing well with regard to her heel ulcer. In fact this is showing signs of excellent improvement and overall I am extremely pleased with where things stand today. I do not see any evidence of infection which is great news and overall I feel like she is headed in the correct direction. Electronic Signature(s) Signed: 09/26/2020 4:51:41 PM By: Worthy Keeler  PA-C Entered By: Worthy Keeler on 09/26/2020 16:51:41 [name] ([id number]) -------------------------------------------------------------------------------- Physical Exam Details Patient Name: [name] Date of Service: 09/26/2020 3:00 PM Medical Record Number: [id number] Patient Account Number: [id number] Date of Birth/Sex: 31-Mar-1951 (70 y.o. F) Treating RN: [name] Primary Care Provider: [name] Other Clinician: Referring Provider: [name] Treating Provider/Extender: [name] in Treatment: 9 Constitutional Well-nourished and well-hydrated in no acute distress. Respiratory normal breathing without difficulty. Psychiatric this patient is able to make decisions and demonstrates good insight into disease process. Alert and Oriented x 3. pleasant and cooperative. Notes Patient's wound bed showed signs of good granulation epithelization at this point. There does not appear to be any evidence of active infection which is great news and overall extremely pleased with where things stand at this point. No fevers, chills, nausea, vomiting, or diarrhea. Electronic Signature(s) Signed: 09/26/2020 4:52:02 PM By: Worthy Keeler PA-C Entered By: Worthy Keeler on 09/26/2020 16:52:02 [name] ([id number]) -------------------------------------------------------------------------------- Physician Orders Details Patient Name: [name] Date of Service: 09/26/2020 3:00 PM Medical Record Number: [id number] Patient Account Number: [id number] Date of Birth/Sex: [birth year] (70 y.o. F) Treating RN: [name] Primary Care Provider: [name] Other Clinician: Referring Provider: [name] Treating Provider/Extender: [name] in Treatment: 9 Verbal / Phone Orders: No Diagnosis Coding ICD-10 Coding Code Description [phone] Pressure ulcer of left heel, stage 3 I10 Essential (primary) hypertension N18.30 Chronic  kidney disease, stage 3 unspecified J44.9 Chronic obstructive pulmonary disease, unspecified G35 Multiple sclerosis Follow-up Appointments o Return Appointment in 1 week. [place]: -  [facility] [place] for wound care. May utilize formulary equivalent dressing for wound treatment orders unless otherwise specified. Home Health Nurse may visit PRN to address patientos wound care needs. o Scheduled days for dressing changes to be completed; exception, patient has scheduled wound care visit that day. - Mondays and Thursdays o **Please direct any NON-WOUND related issues/requests for orders to patient's Primary Care Physician. **If current dressing causes regression in wound condition, may D/C ordered dressing product/s and apply Normal Saline Moist Dressing daily until next [name] or Other MD appointment. **Notify [facility] of regression in wound condition at [phone]. Edema Control - Lymphedema / Segmental Compressive Device / Other Left Lower Extremity o Optional: One layer of unna paste to top of compression wrap (to act as an anchor). o Elevate, Exercise Daily and Avoid Standing for Long Periods of Time. o Elevate legs to the level of the heart and pump ankles as often as possible - ELEVATE LEGS o Elevate leg(s) parallel to the floor when sitting. Off-Loading Left Lower Extremity o Open toe surgical shoe o Turn and reposition every 2 hours o Other: - Keep pressure off left heel Wound Treatment Wound #12 - Calcaneus Wound Laterality: Left Cleanser: Normal Saline 2 x Per Week/30 Days Discharge Instructions: Wash your hands with soap and water. Remove old dressing, discard into plastic bag and place into trash. Cleanse the wound with Normal Saline prior to applying a clean dressing using gauze sponges, not tissues or cotton balls. Do not scrub or use excessive force. Pat dry using gauze sponges, not  tissue or cotton balls. Peri-Wound Care: Desitin Maximum Strength Ointment 4 (oz) 2 x Per Week/30 Days Discharge Instructions: apply around perimeter of wound for protection Primary Dressing: Hydrofera Blue Ready Transfer Foam, 4x5 (in/in) (Generic) 2 x Per Week/30 Days Discharge Instructions: Apply Hydrofera Blue Ready to wound bed as directed Secondary Dressing: Xtrasorb Medium 4x5 (in/in) 2 x Per Week/30 Days Discharge Instructions: Apply to wound as directed. Do not cut. Compression Wrap: Profore Lite LF 3 Multilayer Compression Bandaging System 2 x Per Week/30 Days Discharge Instructions: Apply 3 multi-layer wrap as prescribed. JOCILYN, TREGO ([id number]) Electronic Signature(s) Signed: 09/26/2020 4:43:03 PM By: Georges Mouse, Minus Breeding RN Signed: 09/26/2020 5:49:10 PM By: Worthy Keeler PA-C Entered By: Georges Mouse, Minus Breeding on 09/26/2020 16:00:42 [name] ([id number]) -------------------------------------------------------------------------------- Problem List Details Patient Name: [name] Date of Service: 09/26/2020 3:00 PM Medical Record Number: [id number] Patient Account Number: [id number] Date of Birth/Sex: Nov 02, 1950 (70 y.o. F) Treating RN: [name] Primary Care Provider: [name] Other Clinician: Referring Provider: [name] Treating Provider/Extender: [name] Weeks in Treatment: 9 Active Problems ICD-10 Encounter Code Description Active Date MDM Diagnosis [phone] Pressure ulcer of left heel, stage 3 07/20/2020 No Yes I10 Essential (primary) hypertension 07/20/2020 No Yes N18.30 Chronic kidney disease, stage 3 unspecified 07/20/2020 No Yes J44.9 Chronic obstructive pulmonary disease, unspecified 07/20/2020 No Yes G35 Multiple sclerosis 07/20/2020 No Yes Inactive Problems Resolved Problems Electronic Signature(s) Signed: 09/26/2020 3:15:43 PM By: Worthy Keeler PA-C Entered By: Worthy Keeler on 09/26/2020 15:15:43 Canlas, [name] ([id number]) -------------------------------------------------------------------------------- Progress Note Details Patient Name: [name] Date of Service: 09/26/2020 3:00 PM Medical Record Number: [id number] Patient Account Number: [id number] Date of Birth/Sex: [birth year] (70 y.o. F) Treating RN: [name] Primary Care Provider: [name] Other Clinician: Referring Provider: [name] Treating Provider/Extender: [name] in Treatment: 9 Subjective Chief Complaint Information obtained from Patient  Left Heel Pressure Ulcer History of Present Illness (HPI) 70 year old with a past medical history significant for MS, urinary incontinence, and obesity. She has been seen in the wound clinic before for lower extremity ulcerations treated with compression therapy. she is also known to have hypertension, peripheral vascular disease, COPD, obstructive sleep apnea, lumbar radiculopathy, kyphoscoliosis, urinary issues and tobacco abuse. Smokes a packet of cigarettes a day was recently seen at the [facility] for swelling of her legs and feet with a ulceration on the dorsum of the right foot which has been there for about 6 months. she was recently in the ER about a month ago where she was seen for shortness of breath and swelling of the legs and a chest x-ray was within normal limits had an increase in her BNP and was given Lasix and put on doxycycline for a mild cellulitis and possible UTI. Wounds aresmaller today 11/13/15. Debrided and will continue Santyl. 12/21/2015 -- she was admitted to the hospital overnight on 12/18/2015 and diagnosed with urinary retention and cellulitis of the left lower leg. is past to take clindamycin and use Santyl for her wounds. 01/15/2016 -- come back to see [place] for almost a month and continues to be noncompliant with her dressings 01/30/16 patient presents today for a follow-up visit concerning her ongoing bilateral  lower extremity wounds. We have not seen her for the past 2 weeks although we are supposed to be seen her for weekly visits. She currently has a Foley catheter. She tells me that the wounds are intensely painful especially with pressure and palpation at this point in time. Fortunately she is having no interval signs or symptoms of systemic infection but unfortunately the wounds have not improved dramatically since we last saw her. She does have home health coming to take care of her as well. She is currently not in any compression wraps. 02/06/16 ON evaluation today patient continues to experience discomfort regarding her bilateral lower extremity ulcers. She has continued to tolerate the dressing changes at this point in time and continues to have a Foley catheter as well. Fortunately she is back this week in the past she has been somewhat noncompliant with follow-ups I'm glad to see her today. She tells me that her pain level varies but can be as high as a 7 out of 10 with manipulation of the wound. She tells me that she used to be on oxycodone which was managed by the pain clinic although she is no longer on that as she tells me that she was actually smoking marijuana at the time and when this was found out they discontinued her pain medication. She no longer is taking anything pain medication wise and she also does not smoke cigarettes nor marijuana at this point. 02/12/16; this is a patient I don't believe I have previously seen. She has multiple sclerosis. She has 4 punched-out areas on the anterior lateral left leg 2 areas on the right these are all in the same condition. Reasonably small [dime size] wounds each was some depth. Surface of these wounds does not look particularly healthy as there is adherent slough. There is no evidence of surrounding infection or inflammation. ABIs in this clinic were 0.87 on the right and 0.81 on the left. She is listed as having PAD and is a smoker. Not a  diabetic 02/20/16 patient I gave antibiotics to last week for erythema around both wound sites on the left lateral leg and right lateral leg. This appears to  be a lot better. One of the areas on the left leg is healed however she still has 3 punched-out open areas on the left lateral calf and one on the right lateral calf and one on the dorsal foot. She is an ex-smoker quitting 1 month ago. ABIs in this clinic were 0.87 on the right 0.81 on the left 03/12/16; this is a patient I really don't have a good sense of. It would appear for the first 5 or 6 weeks of her stay in this clinic she was cared for by Dr. Con Memos. She appeared on my schedule in mid October and I saw her twice. She has not been here however in 3 weeks. She has advanced Wound Care at home where she lives with her husband. She has multiple sclerosis. I saw her the first time she had 4 punched-out small wounds on the anterior lateral left leg it appears that she is now down to 2. She also had wounds on the lateral and medial aspect of her right leg which were also small and punched out. The only one that remains is on the right lateral. Because of the nature of her wound I went ahead and ordered formal arterial studies this showed an ABI of 1 on the right and one on the left. TBI of 1.4 on the right and 0.79 on the left. She had normal waveforms. She was in the emergency room on 02/28/16; there is a noted edema of her left leg after a fall. She had a duplex ultrasound that showed no evidence for an acute DVT from the left groin to the popliteal fossa. The study was limited in the calf veins due to edema. Also noticeable that she had a left inguinal enlarged lymph node up to 6.2 cm. She also had a left knee x-ray that showed no acute findings and a right foot x-ray that showed soft tissue swelling but no radiographic evidence of osteomyelitis. The patient does stop smoking 03-19-16 Ms Escorcia presents today for evaluation of her bilateral  lower extremity ulcerations. She states that she has not smoked in several weeks. She denies any pain or discomfort to bilateral lower extremities, tolerating compression therapy as ordered. 03/26/16; I have not seen this patient in 2 weeks however she has done very well with improvements in the areas in question. After we obtain normal arterial studies, increasing the 4-layer compression really seems to look done a nice job here. She has one open area on the left lateral leg and one on the medial right leg. These have filled in nicely and are now superficial wounds that showed epithelialized. I note the left inguinal lymph node at 6.2 cm. I may need to refer this back to the patient's primary doctor. 04/03/16; patient has 2 remaining wounds 1 on the left lateral leg and one on the right medial leg. Both of filled in nicely since we are able to increase her from a 3 layer to a 4 layer compression. I am also following up on the lymph node notable on her left leg DVT rule out in November 04/10/16; area on the right lateral leg is healed. The area on the left leg is still open but looks considerably better with healthy granulation and less wound area. I12/20/17; last week we healed the patient doubt with regards to the wound on the right leg to her on compression stocking 20-30 mmHg as it turns out I don't think she had a stocking, predictably therefore she has reopened. The  left leg as well as the wound on the lateral aspect. Been generally small line 04/24/16; we healed out her right leg 2 weeks ago to her own 20-30 mm compression stockings although as it turns out she didn't have these and did not purchase some apparently because of financial issues. The left leg wound is on the lateral aspect. Both of these wounds are just about healed. 05/01/16; her wounds on the left leg are healed out today. The area on the right leg is also healed. Vitamin diligent effort of our staff we have not been able to get  any form of compression stocking to this patient. The orders for juxtalite's were given to home health this never materialized. We [place], Dynastee H. ([id number]) ordered compression stockings I don't think she was able to afford these. We had a discussion today with both the patient and her husband without these, will accumulate edema and the wounds will simply reopen again. 05/14/16 READMISSION this is a patient we healed out 2 weeks ago. As bilateral lower extremity venous wounds probably some degree of lymphedema. At the beginning in November I did a duplex ultrasound of her left leg that was negative for DVT but did show a lymph node in the left inguinal area presumably reactive. We've been using compression to her lower extremities eventually healed all her wounds on her bilateral calves but we were not able to get any form of compression stockings for her in spite of intense efforts of our staff. She returns today with reopening on the left leg did not the right she has several superficial areas anteriorly but an actual frank ulcer on the lateral left calf. This is about the size of a dime or smaller. She does not really complain of pain. The patient has a history of PAD however arterial studies we did in November were normal. Her ABIs and Doppler waveforms were both within the normal limits. The patient has a history of MS. In discussing things with her today it would appear that the opening was noted 2 weeks ago according to the patient. We gave her Tubigrip stockings when she left the clinic 05/21/16; she has not yet had the duplex ultrasound of her thigh, this is booked for Thursday. The wound on the left lateral lower leg is improved 05/28/16; her duplex ultrasound of the left leg specifically her left thigh did not show a DVT however did show hypoechogenic enlarged left inguinal lymph nodes up to 6.9 x 3.7 x 6.8. This also showed in the one in November however maximal diameter was 6.2, at that  point I thought these were reactive secondary to cellulitis however there is obviously a differential here. Her wound on the left lower leg is closed. She has a superficial open area on the base of her left fifth metatarsal head on the plantar foot. It looks as though she has some wrap injuries on the anterior leg 06/05/16; since she was last year she had a fall on 1/31. She was seen in the ER but sent back home. I think she is also been seen by her primary physician who picked up on the swelling and the lymphadenopathy. She is ordered a CT scan of the abdomen and pelvis however this will be without contrast because of stage IIIB renal insufficiency. Nevertheless this should be a start the workup to make sure there is not is more systemic problem here. She will probably need consideration of a biopsy of the lymph node in her inguinal  area, this would need general surgery. As far as her wounds are concern today the area on the left plantar foot is healed. She has a small weeping area on the left lower leg. The wrap injury from last week has largely Pardoxically there is a lot less edema in the left thigh. 06/12/16; CT scan is on Friday. She has 2 small open areas one on the lateral left lower leg and one on the anterior lower leg on the left. Both of these looks healthy. I reduced her compression last week to Kerlix and Coban this seems to have not a reasonable job 07/02/16; the patient is now in the skilled facility. I think this was arranged out of the ER when she fell at home although I'm not exactly sure. She is going for I think a laparotomy for the pelvic mass next week. All the wounds in the left leg have healed Readmission: 07/20/2020 upon evaluation today patient presents for reevaluation here in clinic unfortunately she is having issues with a significant stage III pressure ulcer to her left heel. This is something that occurred when she was in the hospital sick. And subsequently though she does  not appear to have any new injury she is still trying to recover from the pressure injury which was quite severe during that time. I do not see any signs of infection right now which is great news. With that being said she has been require some debridement to try to clear this up as much as possible. We need to get the wound VAC cleared so that she can see overall healing and improvement that were looking for. The patient does have a history of hypertension, chronic kidney disease stage III, COPD, and multiple sclerosis. Currently she has been trying to do what she can to keep pressure off of her heel while in bed and in other locations as well. 08/04/20 upon evaluation today patient appears to be doing extremely well currently in regard to her wound on the heel. I do believe the Iodoflex has helped loosen things appear. Unfortunately she tells me that the home health nurse has not been putting the Iodoflex on the wound. That is news to [place] and that they tell me they got supplies in the mail but they did not see the Iodoflex being used. Nonetheless I am wondering if this is just something that they missed or if it truly has not been. I did tell them to look when they get home in their box of supplies to see if they saw the dressing which showed and what it should look like. 08/11/2020 upon evaluation today patient appears to be doing decently well in regard to her wound although unfortunately this is showing signs of some blue-green drainage consistent with Pseudomonas it does have a little worried in this regard. Fortunately there is no evidence of active infection which is great news. No fevers, chills, nausea, vomiting, or diarrhea. 08/18/2020 upon evaluation today patient appears to be doing well with regard to her wound. She has been tolerating the dressing changes without complication. Fortunately there is no signs of active infection at this time. No fevers, chills, nausea, vomiting, or  diarrhea. 4/29; left heel wound pressure ulcer. Slightly smaller. 09/04/2020 upon evaluation today patient appears to be doing well with regard to her wound. This is actually on her heel and seems to be doing well other than the fact that is a little bit moist from excessive drainage. There does not appear to be  any signs of active infection at this time. No fevers, chills, nausea, vomiting, or diarrhea.. 09/11/2020 upon evaluation today patient appears to be doing okay in regard to her wound. With that being said she still has quite a bit of drainage noted at this point. Fortunately there is no signs of active infection which is great news overall I am extremely pleased with where things stand today. I do think we need to try to do something a little bit more to help control the drainage a bit better. 5/23; left posterior heel. This actually looks quite good. Using Hydrofera Blue under compression. Dimensions are smaller 09/26/2020 upon evaluation today patient appears to be doing well with regard to her heel ulcer. In fact this is showing signs of excellent improvement and overall I am extremely pleased with where things stand today. I do not see any evidence of infection which is great news and overall I feel like she is headed in the correct direction. Objective Constitutional Well-nourished and well-hydrated in no acute distress. KAMARII, BUREN ([id number]) Vitals Time Taken: 3:32 PM, Height: 64 in, Weight: 265 lbs, BMI: 45.5, Temperature: 98.7 F, Pulse: 82 bpm, Respiratory Rate: 18 breaths/min, Blood Pressure: 100/55 mmHg. Respiratory normal breathing without difficulty. Psychiatric this patient is able to make decisions and demonstrates good insight into disease process. Alert and Oriented x 3. pleasant and cooperative. General Notes: Patient's wound bed showed signs of good granulation epithelization at this point. There does not appear to be any evidence of active infection which is  great news and overall extremely pleased with where things stand at this point. No fevers, chills, nausea, vomiting, or diarrhea. Integumentary (Hair, Skin) Wound #12 status is Open. Original cause of wound was Pressure Injury. The date acquired was: 06/27/2020. The wound has been in treatment 9 weeks. The wound is located on the Left Calcaneus. The wound measures 1.5cm length x 1.7cm width x 0.2cm depth; 2.003cm^2 area and 0.401cm^3 volume. There is Fat Layer (Subcutaneous Tissue) exposed. There is no tunneling or undermining noted. There is a medium amount of serosanguineous drainage noted. The wound margin is thickened. There is medium (34-66%) red, pink, hyper - granulation within the wound bed. There is a small (1-33%) amount of necrotic tissue within the wound bed including Adherent Slough. Assessment Active Problems ICD-10 Pressure ulcer of left heel, stage 3 Essential (primary) hypertension Chronic kidney disease, stage 3 unspecified Chronic obstructive pulmonary disease, unspecified Multiple sclerosis Procedures Wound #12 Pre-procedure diagnosis of Wound #12 is a Pressure Ulcer located on the Left Calcaneus . There was a Three Layer Compression Therapy Procedure with a pre-treatment ABI of 1.1 by [name], RN. Post procedure Diagnosis Wound #12: Same as Pre-Procedure Plan Follow-up Appointments: Return Appointment in 1 week. Home Health: [facility] for wound care. May utilize formulary equivalent dressing for wound treatment orders unless otherwise specified. Home Health Nurse may visit PRN to address patient s wound care needs. Scheduled days for dressing changes to be completed; exception, patient has scheduled wound care visit that day. - Mondays and Thursdays **Please direct any NON-WOUND related issues/requests for orders to patient's Primary Care Physician. **If current dressing causes regression in wound condition, may D/C  ordered dressing product/s and apply Normal Saline Moist Dressing daily until next [place] or Other MD appointment. **Notify [facility] of regression in wound condition at [phone]. Edema Control - Lymphedema / Segmental Compressive Device / Other: Optional: One layer of unna  paste to top of compression wrap (to act as an anchor). Elevate, Exercise Daily and Avoid Standing for Long Periods of Time. Elevate legs to the level of the heart and pump ankles as often as possible - ELEVATE LEGS Elevate leg(s) parallel to the floor when sitting. Off-Loading: Open toe surgical shoe Turn and reposition every 2 hours Other: - Keep pressure off left heel WOUND #12: - Calcaneus Wound Laterality: Left Pentecost, Aaralyn H. ([id number]) Cleanser: Normal Saline 2 x Per Week/30 Days Discharge Instructions: Wash your hands with soap and water. Remove old dressing, discard into plastic bag and place into trash. Cleanse the wound with Normal Saline prior to applying a clean dressing using gauze sponges, not tissues or cotton balls. Do not scrub or use excessive force. Pat dry using gauze sponges, not tissue or cotton balls. Peri-Wound Care: Desitin Maximum Strength Ointment 4 (oz) 2 x Per Week/30 Days Discharge Instructions: apply around perimeter of wound for protection Primary Dressing: Hydrofera Blue Ready Transfer Foam, 4x5 (in/in) (Generic) 2 x Per Week/30 Days Discharge Instructions: Apply Hydrofera Blue Ready to wound bed as directed Secondary Dressing: Xtrasorb Medium 4x5 (in/in) 2 x Per Week/30 Days Discharge Instructions: Apply to wound as directed. Do not cut. Compression Wrap: Profore Lite LF 3 Multilayer Compression Bandaging System 2 x Per Week/30 Days Discharge Instructions: Apply 3 multi-layer wrap as prescribed. 1. Would recommend currently that we go ahead and continue with the wound care measures as before. Specifically we are using the [facility] dressing which I  think is still good. 2. We will continue with XtraSorb as well as a 3 layer compression wrap. 3. I am also can recommend the patient should continue to offload her heel to prevent anything from causing further breakdown. We will see patient back for reevaluation in 1 week here in the clinic. If anything worsens or changes patient will contact our office for additional recommendations. Electronic Signature(s) Signed: 09/26/2020 4:52:40 PM By: Worthy Keeler PA-C Entered By: Worthy Keeler on 09/26/2020 16:52:40 [name] ([id number]) -------------------------------------------------------------------------------- SuperBill Details Patient Name: [name] Date of Service: 09/26/2020 Medical Record Number: [id number] Patient Account Number: [id number] Date of Birth/Sex: 09-02-1950 (70 y.o. F) Treating RN: [name] Primary Care Provider: [name] Other Clinician: Referring Provider: [name] Treating Provider/Extender: [name] in Treatment: 9 Diagnosis Coding ICD-10 Codes Code Description [phone] Pressure ulcer of left heel, stage 3 I10 Essential (primary) hypertension N18.30 Chronic kidney disease, stage 3 unspecified J44.9 Chronic obstructive pulmonary disease, unspecified G35 Multiple sclerosis Facility Procedures CPT4 Code: [id number] Description: (Facility Use Only) [phone] - [place] LWR LT LEG Modifier: Quantity: 1 Physician Procedures CPT4 Code: 7672094 Description: 70962 - WC PHYS LEVEL 3 - EST PT Modifier: Quantity: 1 CPT4 Code: Description: ICD-10 Diagnosis Description L89.623 Pressure ulcer of left heel, stage 3 I10 Essential (primary) hypertension N18.30 Chronic kidney disease, stage 3 unspecified J44.9 Chronic obstructive pulmonary disease, unspecified Modifier: Quantity: Electronic Signature(s) Signed: 09/26/2020 4:52:58 PM By: Worthy Keeler PA-C Previous Signature: 09/26/2020 4:43:03 PM Version By: Georges Mouse, Minus Breeding RN Entered By: Worthy Keeler on 09/26/2020 16:52:57

## 2020-09-27 NOTE — Progress Notes (Addendum)
REIGHAN, HIPOLITO ([id number]) Visit Report for 09/26/2020 Arrival Information Details Patient Name: [name], [name] Date of Service: 09/26/2020 3:00 PM Medical Record Number: [id number] Patient Account Number: [id number] Date of Birth/Sex: [birth year] (69 y.o. F) Treating RN: Carlene Coria Primary Care Kyung Muto: [name] Other Clinician: Referring Emireth Cockerham: [name] Treating Lenoria Narine/Extender: [name] in Treatment: 9 Visit Information History Since Last Visit All ordered tests and consults were completed: No Patient Arrived: Wheel Chair Added or deleted any medications: No Arrival Time: 15:31 Any new allergies or adverse reactions: No Accompanied By: husband Had a fall or experienced change in No Transfer Assistance: None activities of daily living that may affect Patient Identification Verified: Yes risk of falls: Secondary Verification Process Completed: Yes Signs or symptoms of abuse/neglect since last visito No Patient Requires Transmission-Based Precautions: No Hospitalized since last visit: No Patient Has Alerts: Yes Implantable device outside of the clinic excluding No cellular tissue based products placed in the center since last visit: Has Dressing in Place as Prescribed: Yes Pain Present Now: No Electronic Signature(s) Signed: 09/26/2020 3:50:09 PM By: Carlene Coria RN Entered By: Carlene Coria on 09/26/2020 15:32:07 Skeens, [name] ([id number]) -------------------------------------------------------------------------------- Clinic Level of Care Assessment Details Patient Name: [name] Date of Service: 09/26/2020 3:00 PM Medical Record Number: [id number] Patient Account Number: [id number] Date of Birth/Sex: Aug 23, 1950 (69 y.o. F) Treating RN: [name] Primary Care Janyah Singleterry: [name] Other Clinician: Referring Anjeli Casad: [name] Treating Timira Bieda/Extender: [name] in Treatment: 9 Clinic Level of Care  Assessment Items TOOL 1 Quantity Score '[]'  - Use when EandM and Procedure is performed on INITIAL visit 0 ASSESSMENTS - Nursing Assessment / Reassessment '[]'  - General Physical Exam (combine w/ comprehensive assessment (listed just below) when performed on new 0 pt. evals) '[]'  - 0 Comprehensive Assessment (HX, ROS, Risk Assessments, Wounds Hx, etc.) ASSESSMENTS - Wound and Skin Assessment / Reassessment '[]'  - Dermatologic / Skin Assessment (not related to wound area) 0 ASSESSMENTS - Ostomy and/or Continence Assessment and Care '[]'  - Incontinence Assessment and Management 0 '[]'  - 0 Ostomy Care Assessment and Management (repouching, etc.) PROCESS - Coordination of Care '[]'  - Simple Patient / Family Education for ongoing care 0 '[]'  - 0 Complex (extensive) Patient / Family Education for ongoing care '[]'  - 0 Staff obtains Programmer, systems, Records, Test Results / Process Orders '[]'  - 0 Staff telephones HHA, Nursing Homes / Clarify orders / etc '[]'  - 0 Routine Transfer to another Facility (non-emergent condition) '[]'  - 0 Routine Hospital Admission (non-emergent condition) '[]'  - 0 New Admissions / Biomedical engineer / Ordering NPWT, Apligraf, etc. '[]'  - 0 Emergency Hospital Admission (emergent condition) PROCESS - Special Needs '[]'  - Pediatric / Minor Patient Management 0 '[]'  - 0 Isolation Patient Management '[]'  - 0 Hearing / Language / Visual special needs '[]'  - 0 Assessment of Community assistance (transportation, D/C planning, etc.) '[]'  - 0 Additional assistance / Altered mentation '[]'  - 0 Support Surface(s) Assessment (bed, cushion, seat, etc.) INTERVENTIONS - Miscellaneous '[]'  - External ear exam 0 '[]'  - 0 Patient Transfer (multiple staff / Civil Service fast streamer / Similar devices) '[]'  - 0 Simple Staple / Suture removal (25 or less) '[]'  - 0 Complex Staple / Suture removal (26 or more) '[]'  - 0 Hypo/Hyperglycemic Management (do not check if billed separately) '[]'  - 0 Ankle / Brachial Index (ABI) - do not  check if billed separately Has the patient been seen at the hospital within the last three years: Yes Total Score: 0  Level Of Care: ____ [name] ([id number]) Electronic Signature(s) Signed: 09/26/2020 4:43:03 PM By: Georges Mouse, Minus Breeding RN Entered By: Georges Mouse, Minus Breeding on 09/26/2020 16:00:48 [name] ([id number]) -------------------------------------------------------------------------------- Compression Therapy Details Patient Name: [name] Date of Service: 09/26/2020 3:00 PM Medical Record Number: [id number] Patient Account Number: [id number] Date of Birth/Sex: 04/13/1951 (69 y.o. F) Treating RN: [name] Primary Care Giorgio Chabot: [name] Other Clinician: Referring Dariush Mcnellis: [name] Treating Aliyha Fornes/Extender: [name] Weeks in Treatment: 9 Compression Therapy Performed for Wound Assessment: Wound #12 Left Calcaneus Performed By: Clinician [name], RN Compression Type: Three Layer Pre Treatment ABI: 1.1 Post Procedure Diagnosis Same as Pre-procedure Electronic Signature(s) Signed: 09/26/2020 4:43:03 PM By: Georges Mouse, Minus Breeding RN Entered By: Georges Mouse, Minus Breeding on 09/26/2020 16:00:11 [name] ([id number]) -------------------------------------------------------------------------------- Encounter Discharge Information Details Patient Name: [name] Date of Service: 09/26/2020 3:00 PM Medical Record Number: [id number] Patient Account Number: [id number] Date of Birth/Sex: January 28, 1951 (69 y.o. F) Treating RN: [name] Primary Care Dray Dente: [name] Other Clinician: Referring Dreama Kuna: [name] Treating Keshonda Monsour/Extender: [name] in Treatment: 9 Encounter Discharge Information Items Discharge Condition: Stable Ambulatory Status: Wheelchair Discharge Destination: Home Transportation: Private Auto Accompanied By: husband Schedule Follow-up Appointment: Yes Clinical  Summary of Care: Electronic Signature(s) Signed: 09/27/2020 4:53:17 PM By: [name] Entered By: [name] on 09/26/2020 16:16:56 [name] ([id number]) -------------------------------------------------------------------------------- Lower Extremity Assessment Details Patient Name: [name] Date of Service: 09/26/2020 3:00 PM Medical Record Number: [id number] Patient Account Number: [id number] Date of Birth/Sex: Sep 27, 1950 (69 y.o. F) Treating RN: Carlene Coria Primary Care Wynee Matarazzo: [name] Other Clinician: Referring Rhyen Mazariego: [name] Treating Thia Olesen/Extender: [name] in Treatment: 9 Edema Assessment Assessed: [Left: No] [Right: No] Edema: [Left: Ye] [Right: s] Calf Left: Right: Point of Measurement: 36 cm From Medial Instep 33 cm Ankle Left: Right: Point of Measurement: 10 cm From Medial Instep 20 cm Vascular Assessment Pulses: Dorsalis Pedis Palpable: [Left:Yes] Electronic Signature(s) Signed: 09/26/2020 3:50:09 PM By: Carlene Coria RN Entered By: Carlene Coria on 09/26/2020 15:42:19 Bowne, [name] ([id number]) -------------------------------------------------------------------------------- Multi Wound Chart Details Patient Name: [name] Date of Service: 09/26/2020 3:00 PM Medical Record Number: [id number] Patient Account Number: [id number] Date of Birth/Sex: [birth year] (69 y.o. F) Treating RN: [name] Primary Care Kiam Bransfield: [name] Other Clinician: Referring Jolena Kittle: [name] Treating Kima Malenfant/Extender: [name] in Treatment: 9 Vital Signs Height(in): 57 Pulse(bpm): 49 Weight(lbs): 265 Blood Pressure(mmHg): 100/55 Body Mass Index(BMI): 45 Temperature(F): 98.7 Respiratory Rate(breaths/min): 18 Photos: [N/A:N/A] Wound Location: Left Calcaneus N/A N/A Wounding Event: Pressure Injury N/A N/A Primary Etiology: Pressure Ulcer N/A N/A Comorbid History: Cataracts, Chronic  Obstructive N/A N/A Pulmonary Disease (COPD), Sleep Apnea, Hypertension, Osteoarthritis, Neuropathy Date Acquired: 06/27/2020 N/A N/A Weeks of Treatment: 9 N/A N/A Wound Status: Open N/A N/A Measurements L x W x D (cm) 1.5x1.7x0.2 N/A N/A Area (cm) : 2.003 N/A N/A Volume (cm) : 0.401 N/A N/A % Reduction in Area: 60.80% N/A N/A % Reduction in Volume: 60.70% N/A N/A Classification: Category/Stage III N/A N/A Exudate Amount: Medium N/A N/A Exudate Type: Serosanguineous N/A N/A Exudate Color: red, brown N/A N/A Wound Margin: Thickened N/A N/A Granulation Amount: Medium (34-66%) N/A N/A Granulation Quality: Red, Pink, Hyper-granulation N/A N/A Necrotic Amount: Small (1-33%) N/A N/A Exposed Structures: Fat Layer (Subcutaneous Tissue): N/A N/A Yes Fascia: No Tendon: No Muscle: No Joint: No Bone: No Epithelialization: None N/A N/A Treatment Notes Electronic Signature(s) Signed: 09/26/2020 4:43:03 PM By: Georges Mouse,  Kenia RN Entered By: Georges Mouse, Minus Breeding on 09/26/2020 15:59:37 [name] ([id number]) -------------------------------------------------------------------------------- Multi-Disciplinary Care Plan Details Patient Name: [name] Date of Service: 09/26/2020 3:00 PM Medical Record Number: [id number] Patient Account Number: [id number] Date of Birth/Sex: [birth year] (69 y.o. F) Treating RN: [name] Primary Care Tricia Oaxaca: [name] Other Clinician: Referring Nycholas Rayner: [name] Treating Sura Canul/Extender: [name] in Treatment: 9 Active Inactive Wound/Skin Impairment Nursing Diagnoses: Impaired tissue integrity Goals: Patient/caregiver will verbalize understanding of skin care regimen Date Initiated: 07/20/2020 Date Inactivated: 09/04/2020 Target Resolution Date: 09/19/2020 Goal Status: Met Ulcer/skin breakdown will have a volume reduction of 30% by week 4 Date Initiated: 07/20/2020 Date Inactivated: 08/25/2020 Target  Resolution Date: 08/20/2020 Goal Status: Met Ulcer/skin breakdown will have a volume reduction of 50% by week 8 Date Initiated: 07/20/2020 Target Resolution Date: 09/19/2020 Goal Status: Active Ulcer/skin breakdown will have a volume reduction of 80% by week 12 Date Initiated: 07/20/2020 Target Resolution Date: 10/20/2020 Goal Status: Active Ulcer/skin breakdown will heal within 14 weeks Date Initiated: 07/20/2020 Target Resolution Date: 11/19/2020 Goal Status: Active Interventions: Assess patient/caregiver ability to obtain necessary supplies Assess patient/caregiver ability to perform ulcer/skin care regimen upon admission and as needed Provide education on ulcer and skin care Treatment Activities: Referred to DME Denni [place] for dressing supplies : 07/20/2020 Skin care regimen initiated : 07/20/2020 Notes: Electronic Signature(s) Signed: 09/26/2020 4:43:03 PM By: Georges Mouse, Minus Breeding RN Entered By: Georges Mouse, Minus Breeding on 09/26/2020 15:58:58 [name] ([id number]) -------------------------------------------------------------------------------- Pain Assessment Details Patient Name: [name] Date of Service: 09/26/2020 3:00 PM Medical Record Number: [id number] Patient Account Number: [id number] Date of Birth/Sex: 07-01-1950 (69 y.o. F) Treating RN: Carlene Coria Primary Care Dallys Nowakowski: [name] Other Clinician: Referring Nekesha Font: [name] Treating Janet Decesare/Extender: [name] in Treatment: 9 Active Problems Location of Pain Severity and Description of Pain Patient Has Paino No Site Locations Pain Management and Medication Current Pain Management: Electronic Signature(s) Signed: 09/26/2020 3:50:09 PM By: Carlene Coria RN Entered By: Carlene Coria on 09/26/2020 15:32:40 [name] ([id number]) -------------------------------------------------------------------------------- Patient/Caregiver Education Details Patient Name: [name] Date of Service: 09/26/2020 3:00 PM Medical Record Number: [id number] Patient Account Number: [id number] Date of Birth/Gender: 1950-05-11 (69 y.o. F) Treating RN: [name] Primary Care Physician: [name] Other Clinician: Referring Physician: [name] Treating Physician/Extender: [name] in Treatment: 9 Education Assessment Education Provided To: Patient Education Topics Provided Wound/Skin Impairment: Methods: Explain/Verbal Responses: State content correctly Electronic Signature(s) Signed: 09/26/2020 4:43:03 PM By: Georges Mouse, Minus Breeding RN Entered By: Georges Mouse, Minus Breeding on 09/26/2020 16:01:05 [name] ([id number]) -------------------------------------------------------------------------------- Wound Assessment Details Patient Name: [name] Date of Service: 09/26/2020 3:00 PM Medical Record Number: [id number] Patient Account Number: [id number] Date of Birth/Sex: [birth year] (69 y.o. F) Treating RN: Carlene Coria Primary Care Reginna Sermeno: [name] Other Clinician: Referring Cooper Stamp: [name] Treating Kassiah Mccrory/Extender: [name] in Treatment: 9 Wound Status Wound Number: 12 Primary Pressure Ulcer Etiology: Wound Location: Left Calcaneus Wound Open Wounding Event: Pressure Injury Status: Date Acquired: 06/27/2020 Comorbid Cataracts, Chronic Obstructive Pulmonary Disease Weeks Of Treatment: 9 History: (COPD), Sleep Apnea, Hypertension, Osteoarthritis, Clustered Wound: No Neuropathy Photos Wound Measurements Length: (cm) 1.5 Width: (cm) 1.7 Depth: (cm) 0.2 Area: (cm) 2.003 Volume: (cm) 0.401 % Reduction in Area: 60.8% % Reduction in Volume: 60.7% Epithelialization: None Tunneling: No Undermining: No Wound Description Classification: Category/Stage III Wound Margin: Thickened Exudate Amount: Medium Exudate Type: Serosanguineous Exudate Color: red, brown Foul Odor After Cleansing:  No  Slough/Fibrino Yes Wound Bed Granulation Amount: Medium (34-66%) Exposed Structure Granulation Quality: Red, Pink, Hyper-granulation Fascia Exposed: No Necrotic Amount: Small (1-33%) Fat Layer (Subcutaneous Tissue) Exposed: Yes Necrotic Quality: Adherent Slough Tendon Exposed: No Muscle Exposed: No Joint Exposed: No Bone Exposed: No Treatment Notes Wound #12 (Calcaneus) Wound Laterality: Left Cleanser Normal Saline Discharge Instruction: Wash your hands with soap and water. Remove old dressing, discard into plastic bag and place into trash. Cleanse the wound with Normal Saline prior to applying a clean dressing using gauze sponges, not tissues or cotton balls. Do not Robers, Saudia H. ([id number]) scrub or use excessive force. Pat dry using gauze sponges, not tissue or cotton balls. Peri-Wound Care Desitin Maximum Strength Ointment 4 (oz) Discharge Instruction: apply around perimeter of wound for protection Topical Primary Dressing Hydrofera Blue Ready Transfer Foam, 4x5 (in/in) Discharge Instruction: Apply Hydrofera Blue Ready to wound bed as directed Secondary Dressing Xtrasorb Medium 4x5 (in/in) Discharge Instruction: Apply to wound as directed. Do not cut. Secured With Compression Wrap Profore Lite LF 3 Multilayer Compression Bandaging System Discharge Instruction: Apply 3 multi-layer wrap as prescribed. Compression Stockings Add-Ons Electronic Signature(s) Signed: 09/26/2020 3:50:09 PM By: Carlene Coria RN Entered By: Carlene Coria on 09/26/2020 15:41:29 [name] ([id number]) -------------------------------------------------------------------------------- Vitals Details Patient Name: [name] Date of Service: 09/26/2020 3:00 PM Medical Record Number: [id number] Patient Account Number: [id number] Date of Birth/Sex: 05-08-1950 (69 y.o. F) Treating RN: Carlene Coria Primary Care Suann Klier: [name] Other Clinician: Referring Falon Flinchum: [name] Treating Jerie Basford/Extender: [name] in Treatment: 9 Vital Signs Time Taken: 15:32 Temperature (F): 98.7 Height (in): 64 Pulse (bpm): 82 Weight (lbs): 265 Respiratory Rate (breaths/min): 18 Body Mass Index (BMI): 45.5 Blood Pressure (mmHg): 100/55 Reference Range: 80 - 120 mg / dl Electronic Signature(s) Signed: 09/26/2020 3:50:09 PM By: Carlene Coria RN Entered By: Carlene Coria on 09/26/2020 15:32:32

## 2020-10-02 ENCOUNTER — Encounter: Payer: Medicare Other | Admitting: Physician Assistant

## 2020-10-09 ENCOUNTER — Encounter: Payer: Medicare Other | Attending: Physician Assistant | Admitting: Physician Assistant

## 2020-10-09 ENCOUNTER — Other Ambulatory Visit: Payer: Self-pay

## 2020-10-09 DIAGNOSIS — J449 Chronic obstructive pulmonary disease, unspecified: Secondary | ICD-10-CM | POA: Insufficient documentation

## 2020-10-09 DIAGNOSIS — L89623 Pressure ulcer of left heel, stage 3: Secondary | ICD-10-CM | POA: Insufficient documentation

## 2020-10-09 DIAGNOSIS — I10 Essential (primary) hypertension: Secondary | ICD-10-CM | POA: Diagnosis not present

## 2020-10-09 DIAGNOSIS — E669 Obesity, unspecified: Secondary | ICD-10-CM | POA: Insufficient documentation

## 2020-10-09 DIAGNOSIS — F17210 Nicotine dependence, cigarettes, uncomplicated: Secondary | ICD-10-CM | POA: Diagnosis not present

## 2020-10-09 DIAGNOSIS — G35 Multiple sclerosis: Secondary | ICD-10-CM | POA: Diagnosis not present

## 2020-10-09 DIAGNOSIS — G4733 Obstructive sleep apnea (adult) (pediatric): Secondary | ICD-10-CM | POA: Diagnosis not present

## 2020-10-09 NOTE — Progress Notes (Addendum)
[name], [name] ([id number]) Visit Report for 10/09/2020 Chief Complaint Document Details Patient Name: [name], [name] Date of Service: 10/09/2020 3:00 PM Medical Record Number: [id number] Patient Account Number: [id number] Date of Birth/Sex: 19-Nov-1950 (69 y.o. F) Treating RN: Donnamarie Poag Primary Care Provider: [name] Other Clinician: Referring Provider: [name] Treating Provider/Extender: [name] in Treatment: 11 Information Obtained from: Patient Chief Complaint Left Heel Pressure Ulcer Electronic Signature(s) Signed: 10/09/2020 3:37:57 PM By: Worthy Keeler PA-C Entered By: Worthy Keeler on 10/09/2020 15:37:57 [name], [name] ([id number]) -------------------------------------------------------------------------------- HPI Details Patient Name: [name] Date of Service: 10/09/2020 3:00 PM Medical Record Number: [id number] Patient Account Number: [id number] Date of Birth/Sex: 1951/03/22 (69 y.o. F) Treating RN: Donnamarie Poag Primary Care Provider: [name] Other Clinician: Referring Provider: [name] Treating Provider/Extender: [name] in Treatment: 11 History of Present Illness HPI Description: 70 year old with a past medical history significant for [name], urinary incontinence, and obesity. She has been seen in the wound clinic before for lower extremity ulcerations treated with compression therapy. she is also known to have hypertension, peripheral vascular disease, COPD, obstructive sleep apnea, lumbar radiculopathy, kyphoscoliosis, urinary issues and tobacco abuse. Smokes a packet of cigarettes a day was recently seen at the [facility] for swelling of her legs and feet with a ulceration on the dorsum of the right foot which has been there for about 6 months. she was recently in the ER about a month ago where she was seen for shortness of breath and swelling of the legs and a chest x-ray was within  normal limits had an increase in her BNP and was given Lasix and put on doxycycline for a mild cellulitis and possible UTI. Wounds aresmaller today 11/13/15. Debrided and will continue Santyl. 12/21/2015 -- she was admitted to the hospital overnight on 12/18/2015 and diagnosed with urinary retention and cellulitis of the left lower leg. is past to take clindamycin and use Santyl for her wounds. 01/15/2016 -- come back to see [place] for almost a month and continues to be noncompliant with her dressings 01/30/16 patient presents today for a follow-up visit concerning her ongoing bilateral lower extremity wounds. We have not seen her for the past 2 weeks although we are supposed to be seen her for weekly visits. She currently has a Foley catheter. She tells me that the wounds are intensely painful especially with pressure and palpation at this point in time. Fortunately she is having no interval signs or symptoms of systemic infection but unfortunately the wounds have not improved dramatically since we last saw her. She does have home health coming to take care of her as well. She is currently not in any compression wraps. 02/06/16 ON evaluation today patient continues to experience discomfort regarding her bilateral lower extremity ulcers. She has continued to tolerate the dressing changes at this point in time and continues to have a Foley catheter as well. Fortunately she is back this week in the past she has been somewhat noncompliant with follow-ups I'm glad to see her today. She tells me that her pain level varies but can be as high as a 7 out of 10 with manipulation of the wound. She tells me that she used to be on oxycodone which was managed by the pain clinic although she is no longer on that as she tells me that she was actually smoking marijuana at the time and when this was found out they discontinued her pain medication. She no longer  is taking anything pain medication wise and she also does not  smoke cigarettes nor marijuana at this point. 02/12/16; this is a patient I don't believe I have previously seen. She has multiple sclerosis. She has 4 punched-out areas on the anterior lateral left leg 2 areas on the right these are all in the same condition. Reasonably small [dime size] wounds each was some depth. Surface of these wounds does not look particularly healthy as there is adherent slough. There is no evidence of surrounding infection or inflammation. ABIs in this clinic were 0.87 on the right and 0.81 on the left. She is listed as having PAD and is a smoker. Not a diabetic 02/20/16 patient I gave antibiotics to last week for erythema around both wound sites on the left lateral leg and right lateral leg. This appears to be a lot better. One of the areas on the left leg is healed however she still has 3 punched-out open areas on the left lateral calf and one on the right lateral calf and one on the dorsal foot. She is an ex-smoker quitting 1 month ago. ABIs in this clinic were 0.87 on the right 0.81 on the left 03/12/16; this is a patient I really don't have a good sense of. It would appear for the first 5 or 6 weeks of her stay in this clinic she was cared for by Dr. Con Memos. She appeared on my schedule in mid October and I saw her twice. She has not been here however in 3 weeks. She has advanced Wound Care at home where she lives with her husband. She has multiple sclerosis. I saw her the first time she had 4 punched-out small wounds on the anterior lateral left leg it appears that she is now down to 2. She also had wounds on the lateral and medial aspect of her right leg which were also small and punched out. The only one that remains is on the right lateral. Because of the nature of her wound I went ahead and ordered formal arterial studies this showed an ABI of 1 on the right and one on the left. TBI of 1.4 on the right and 0.79 on the left. She had normal waveforms. She was in the  emergency room on 02/28/16; there is a noted edema of her left leg after a fall. She had a duplex ultrasound that showed no evidence for an acute DVT from the left groin to the popliteal fossa. The study was limited in the calf veins due to edema. Also noticeable that she had a left inguinal enlarged lymph node up to 6.2 cm. She also had a left knee x-ray that showed no acute findings and a right foot x-ray that showed soft tissue swelling but no radiographic evidence of osteomyelitis. The patient does stop smoking 03-19-16 [name] presents today for evaluation of her bilateral lower extremity ulcerations. She states that she has not smoked in several weeks. She denies any pain or discomfort to bilateral lower extremities, tolerating compression therapy as ordered. 03/26/16; I have not seen this patient in 2 weeks however she has done very well with improvements in the areas in question. After we obtain normal arterial studies, increasing the 4-layer compression really seems to look done a nice job here. She has one open area on the left lateral leg and one on the medial right leg. These have filled in nicely and are now superficial wounds that showed epithelialized. I note the left inguinal lymph node at  6.2 cm. I may need to refer this back to the patient's primary doctor. 04/03/16; patient has 2 remaining wounds 1 on the left lateral leg and one on the right medial leg. Both of filled in nicely since we are able to increase her from a 3 layer to a 4 layer compression. I am also following up on the lymph node notable on her left leg DVT rule out in November 04/10/16; area on the right lateral leg is healed. The area on the left leg is still open but looks considerably better with healthy granulation and less wound area. I12/20/17; last week we healed the patient doubt with regards to the wound on the right leg to her on compression stocking 20-30 mmHg as it turns out I don't think she had a stocking,  predictably therefore she has reopened. The left leg as well as the wound on the lateral aspect. Been generally small line 04/24/16; we healed out her right leg 2 weeks ago to her own 20-30 mm compression stockings although as it turns out she didn't have these and did not purchase some apparently because of financial issues. The left leg wound is on the lateral aspect. Both of these wounds are just about healed. 05/01/16; her wounds on the left leg are healed out today. The area on the right leg is also healed. Vitamin diligent effort of our staff we have not been able to get any form of compression stocking to this patient. The orders for juxtalite's were given to home health this never materialized. We ordered compression stockings I don't think she was able to afford these. We had a discussion today with both the patient and her husband without these, will accumulate edema and the wounds will simply reopen again. 05/14/16 READMISSION [name], [name] ([id number]) this is a patient we healed out 2 weeks ago. As bilateral lower extremity venous wounds probably some degree of lymphedema. At the beginning in November I did a duplex ultrasound of her left leg that was negative for DVT but did show a lymph node in the left inguinal area presumably reactive. We've been using compression to her lower extremities eventually healed all her wounds on her bilateral calves but we were not able to get any form of compression stockings for her in spite of intense efforts of our staff. She returns today with reopening on the left leg did not the right she has several superficial areas anteriorly but an actual frank ulcer on the lateral left calf. This is about the size of a dime or smaller. She does not really complain of pain. The patient has a history of PAD however arterial studies we did in November were normal. Her ABIs and Doppler waveforms were both within the normal limits. The patient has a history of [name].  In discussing things with her today it would appear that the opening was noted 2 weeks ago according to the patient. We gave her Tubigrip stockings when she left the clinic 05/21/16; she has not yet had the duplex ultrasound of her thigh, this is booked for Thursday. The wound on the left lateral lower leg is improved 05/28/16; her duplex ultrasound of the left leg specifically her left thigh did not show a DVT however did show hypoechogenic enlarged left inguinal lymph nodes up to 6.9 x 3.7 x 6.8. This also showed in the one in November however maximal diameter was 6.2, at that point I thought these were reactive secondary to cellulitis however there is obviously  a differential here. Her wound on the left lower leg is closed. She has a superficial open area on the base of her left fifth metatarsal head on the plantar foot. It looks as though she has some wrap injuries on the anterior leg 06/05/16; since she was last year she had a fall on 1/31. She was seen in the ER but sent back home. I think she is also been seen by her primary physician who picked up on the swelling and the lymphadenopathy. She is ordered a CT scan of the abdomen and pelvis however this will be without contrast because of stage IIIB renal insufficiency. Nevertheless this should be a start the workup to make sure there is not is more systemic problem here. She will probably need consideration of a biopsy of the lymph node in her inguinal area, this would need general surgery. As far as her wounds are concern today the area on the left plantar foot is healed. She has a small weeping area on the left lower leg. The wrap injury from last week has largely Pardoxically there is a lot less edema in the left thigh. 06/12/16; CT scan is on Friday. She has 2 small open areas one on the lateral left lower leg and one on the anterior lower leg on the left. Both of these looks healthy. I reduced her compression last week to Kerlix and Coban this  seems to have not a reasonable job 07/02/16; the patient is now in the skilled facility. I think this was arranged out of the ER when she fell at home although I'm not exactly sure. She is going for I think a laparotomy for the pelvic mass next week. All the wounds in the left leg have healed Readmission: 07/20/2020 upon evaluation today patient presents for reevaluation here in clinic unfortunately she is having issues with a significant stage III pressure ulcer to her left heel. This is something that occurred when she was in the hospital sick. And subsequently though she does not appear to have any new injury she is still trying to recover from the pressure injury which was quite severe during that time. I do not see any signs of infection right now which is great news. With that being said she has been require some debridement to try to clear this up as much as possible. We need to get the wound VAC cleared so that she can see overall healing and improvement that were looking for. The patient does have a history of hypertension, chronic kidney disease stage III, COPD, and multiple sclerosis. Currently she has been trying to do what she can to keep pressure off of her heel while in bed and in other locations as well. 08/04/20 upon evaluation today patient appears to be doing extremely well currently in regard to her wound on the heel. I do believe the Iodoflex has helped loosen things appear. Unfortunately she tells me that the home health nurse has not been putting the Iodoflex on the wound. That is news to [place] and that they tell me they got supplies in the mail but they did not see the Iodoflex being used. Nonetheless I am wondering if this is just something that they missed or if it truly has not been. I did tell them to look when they get home in their box of supplies to see if they saw the dressing which showed and what it should look like. 08/11/2020 upon evaluation today patient appears to be  doing decently  well in regard to her wound although unfortunately this is showing signs of some blue-green drainage consistent with Pseudomonas it does have a little worried in this regard. Fortunately there is no evidence of active infection which is great news. No fevers, chills, nausea, vomiting, or diarrhea. 08/18/2020 upon evaluation today patient appears to be doing well with regard to her wound. She has been tolerating the dressing changes without complication. Fortunately there is no signs of active infection at this time. No fevers, chills, nausea, vomiting, or diarrhea. 4/29; left heel wound pressure ulcer. Slightly smaller. 09/04/2020 upon evaluation today patient appears to be doing well with regard to her wound. This is actually on her heel and seems to be doing well other than the fact that is a little bit moist from excessive drainage. There does not appear to be any signs of active infection at this time. No fevers, chills, nausea, vomiting, or diarrhea.. 09/11/2020 upon evaluation today patient appears to be doing okay in regard to her wound. With that being said she still has quite a bit of drainage noted at this point. Fortunately there is no signs of active infection which is great news overall I am extremely pleased with where things stand today. I do think we need to try to do something a little bit more to help control the drainage a bit better. 5/23; left posterior heel. This actually looks quite good. Using Hydrofera Blue under compression. Dimensions are smaller 09/26/2020 upon evaluation today patient appears to be doing well with regard to her heel ulcer. In fact this is showing signs of excellent improvement and overall I am extremely pleased with where things stand today. I do not see any evidence of infection which is great news and overall I feel like she is headed in the correct direction. 10/09/2020 upon evaluation today patient appears to be doing excellent in regard to  her wound. She has been tolerating the dressing changes without complication. Fortunately there is no evidence of active infection which is also excellent news. No fevers, chills, nausea, vomiting, or diarrhea. Electronic Signature(s) Signed: 10/09/2020 3:44:40 PM By: Worthy Keeler PA-C Entered By: Worthy Keeler on 10/09/2020 15:44:40 [name] ([id number]) -------------------------------------------------------------------------------- Physical Exam Details Patient Name: [name] Date of Service: 10/09/2020 3:00 PM Medical Record Number: [id number] Patient Account Number: [id number] Date of Birth/Sex: January 01, 1951 (69 y.o. F) Treating RN: Donnamarie Poag Primary Care Provider: [name] Other Clinician: Referring Provider: [name] Treating Provider/Extender: [name] in Treatment: 10 Constitutional Well-nourished and well-hydrated in no acute distress. Respiratory normal breathing without difficulty. Psychiatric this patient is able to make decisions and demonstrates good insight into disease process. Alert and Oriented x 3. pleasant and cooperative. Notes Patient's wound bed showed signs of good granulation epithelization at this point. I think she is making great progress there is no need for sharp debridement today and I believe the [facility] is doing an excellent job. The wrap she had on was definitely not standard she had Curlex and a cotton layer underneath then an Ace wrap followed by Coban over top. Again this was nontraditional but seems that the home health nurse was trying to make doing what she had nonetheless we were not advised that this was an issue and something that she would be doing. Either way overall I feel like that it did not have any detriment to the wound her leg other than it was not wrapped quite high enough but we do need to  make sure they get the supplies and need for the actual 3 layer compression wrap. Electronic  Signature(s) Signed: 10/09/2020 3:45:24 PM By: Worthy Keeler PA-C Entered By: Worthy Keeler on 10/09/2020 15:45:24 [name] ([id number]) -------------------------------------------------------------------------------- Physician Orders Details Patient Name: [name] Date of Service: 10/09/2020 3:00 PM Medical Record Number: [id number] Patient Account Number: [id number] Date of Birth/Sex: 12-15-1950 (69 y.o. F) Treating RN: Donnamarie Poag Primary Care Provider: [name] Other Clinician: Referring Provider: [name] Treating Provider/Extender: [name] in Treatment: 11 Verbal / Phone Orders: No Diagnosis Coding ICD-10 Coding Code Description [phone] Pressure ulcer of left heel, stage 3 I10 Essential (primary) hypertension N18.30 Chronic kidney disease, stage 3 unspecified J44.9 Chronic obstructive pulmonary disease, unspecified G35 Multiple sclerosis Follow-up Appointments o Return Appointment in 1 week. [place]: - [facility] for wound care. May utilize formulary equivalent dressing for wound treatment orders unless otherwise specified. Home Health Nurse may visit PRN to address patientos wound care needs. - Please wrap up to 3 finger lengths below the knee--three layer compression wrap--layer one is protective cotton layer, second layer is soft stretch and third layer is coban--call wound center with questions M-F o Scheduled days for dressing changes to be completed; exception, patient has scheduled wound care visit that day. - Mondays and Thursdays o **Please direct any NON-WOUND related issues/requests for orders to patient's Primary Care Physician. **If current dressing causes regression in wound condition, may D/C ordered dressing product/s and apply Normal Saline Moist Dressing daily until next [place] or Other MD appointment. **Notify Wound Healing Center of regression  in wound condition at [phone]. Edema Control - Lymphedema / Segmental Compressive Device / Other Left Lower Extremity o Optional: One layer of unna paste to top of compression wrap (to act as an anchor). o Elevate, Exercise Daily and Avoid Standing for Long Periods of Time. o Elevate legs to the level of the heart and pump ankles as often as possible - ELEVATE LEGS o Elevate leg(s) parallel to the floor when sitting. Off-Loading Left Lower Extremity o Open toe surgical shoe o Turn and reposition every 2 hours o Other: - Keep pressure off left heel Wound Treatment Wound #12 - Calcaneus Wound Laterality: Left Cleanser: Normal Saline 2 x Per Week/30 Days Discharge Instructions: Wash your hands with soap and water. Remove old dressing, discard into plastic bag and place into trash. Cleanse the wound with Normal Saline prior to applying a clean dressing using gauze sponges, not tissues or cotton balls. Do not scrub or use excessive force. Pat dry using gauze sponges, not tissue or cotton balls. Peri-Wound Care: Desitin Maximum Strength Ointment 4 (oz) 2 x Per Week/30 Days Discharge Instructions: apply around perimeter of wound for protection Primary Dressing: Hydrofera Blue Ready Transfer Foam, 4x5 (in/in) (Generic) 2 x Per Week/30 Days Discharge Instructions: Apply Hydrofera Blue Ready to wound bed as directed Secondary Dressing: Xtrasorb Medium 4x5 (in/in) 2 x Per Week/30 Days Discharge Instructions: Apply to wound as directed. Do not cut. Compression Wrap: Profore Lite LF 3 Multilayer Compression Bandaging System 2 x Per Week/30 Days Discharge Instructions: Apply 3 multi-layer wrap as prescribed. ANGELLEE, COHILL ([id number]) Electronic Signature(s) Signed: 10/09/2020 4:11:38 PM By: Donnamarie Poag Signed: 10/09/2020 5:46:17 PM By: Worthy Keeler PA-C Entered By: Donnamarie Poag on 10/09/2020 15:42:12 [name], [name]  ([id number]) -------------------------------------------------------------------------------- Problem List Details Patient Name: [name] Date of Service: 10/09/2020 3:00 PM Medical  Record Number: [id number] Patient Account Number: [id number] Date of Birth/Sex: 03-10-1951 (69 y.o. F) Treating RN: Donnamarie Poag Primary Care Provider: [name] Other Clinician: Referring Provider: [name] Treating Provider/Extender: [name] in Treatment: 11 Active Problems ICD-10 Encounter Code Description Active Date MDM Diagnosis [phone] Pressure ulcer of left heel, stage 3 07/20/2020 No Yes I10 Essential (primary) hypertension 07/20/2020 No Yes N18.30 Chronic kidney disease, stage 3 unspecified 07/20/2020 No Yes J44.9 Chronic obstructive pulmonary disease, unspecified 07/20/2020 No Yes G35 Multiple sclerosis 07/20/2020 No Yes Inactive Problems Resolved Problems Electronic Signature(s) Signed: 10/09/2020 3:37:49 PM By: Worthy Keeler PA-C Entered By: Worthy Keeler on 10/09/2020 15:37:48 [place], [name] ([id number]) -------------------------------------------------------------------------------- Progress Note Details Patient Name: [name] Date of Service: 10/09/2020 3:00 PM Medical Record Number: [id number] Patient Account Number: [id number] Date of Birth/Sex: 22-Jun-1950 (69 y.o. F) Treating RN: Donnamarie Poag Primary Care Provider: [name] Other Clinician: Referring Provider: [name] Treating Provider/Extender: [name] in Treatment: 11 Subjective Chief Complaint Information obtained from Patient Left Heel Pressure Ulcer History of Present Illness (HPI) 70 year old with a past medical history significant for [name], urinary incontinence, and obesity. She has been seen in the wound clinic before for lower extremity ulcerations treated with compression therapy. she is also known to have hypertension, peripheral vascular disease,  COPD, obstructive sleep apnea, lumbar radiculopathy, kyphoscoliosis, urinary issues and tobacco abuse. Smokes a packet of cigarettes a day was recently seen at the [facility] for swelling of her legs and feet with a ulceration on the dorsum of the right foot which has been there for about 6 months. she was recently in the ER about a month ago where she was seen for shortness of breath and swelling of the legs and a chest x-ray was within normal limits had an increase in her BNP and was given Lasix and put on doxycycline for a mild cellulitis and possible UTI. Wounds aresmaller today 11/13/15. Debrided and will continue Santyl. 12/21/2015 -- she was admitted to the hospital overnight on 12/18/2015 and diagnosed with urinary retention and cellulitis of the left lower leg. is past to take clindamycin and use Santyl for her wounds. 01/15/2016 -- come back to see [place] for almost a month and continues to be noncompliant with her dressings 01/30/16 patient presents today for a follow-up visit concerning her ongoing bilateral lower extremity wounds. We have not seen her for the past 2 weeks although we are supposed to be seen her for weekly visits. She currently has a Foley catheter. She tells me that the wounds are intensely painful especially with pressure and palpation at this point in time. Fortunately she is having no interval signs or symptoms of systemic infection but unfortunately the wounds have not improved dramatically since we last saw her. She does have home health coming to take care of her as well. She is currently not in any compression wraps. 02/06/16 ON evaluation today patient continues to experience discomfort regarding her bilateral lower extremity ulcers. She has continued to tolerate the dressing changes at this point in time and continues to have a Foley catheter as well. Fortunately she is back this week in the past she has been somewhat noncompliant with follow-ups I'm  glad to see her today. She tells me that her pain level varies but can be as high as a 7 out of 10 with manipulation of the wound. She tells me that she used to be on oxycodone which was managed by the pain  clinic although she is no longer on that as she tells me that she was actually smoking marijuana at the time and when this was found out they discontinued her pain medication. She no longer is taking anything pain medication wise and she also does not smoke cigarettes nor marijuana at this point. 02/12/16; this is a patient I don't believe I have previously seen. She has multiple sclerosis. She has 4 punched-out areas on the anterior lateral left leg 2 areas on the right these are all in the same condition. Reasonably small [dime size] wounds each was some depth. Surface of these wounds does not look particularly healthy as there is adherent slough. There is no evidence of surrounding infection or inflammation. ABIs in this clinic were 0.87 on the right and 0.81 on the left. She is listed as having PAD and is a smoker. Not a diabetic 02/20/16 patient I gave antibiotics to last week for erythema around both wound sites on the left lateral leg and right lateral leg. This appears to be a lot better. One of the areas on the left leg is healed however she still has 3 punched-out open areas on the left lateral calf and one on the right lateral calf and one on the dorsal foot. She is an ex-smoker quitting 1 month ago. ABIs in this clinic were 0.87 on the right 0.81 on the left 03/12/16; this is a patient I really don't have a good sense of. It would appear for the first 5 or 6 weeks of her stay in this clinic she was cared for by Dr. Con Memos. She appeared on my schedule in mid October and I saw her twice. She has not been here however in 3 weeks. She has advanced Wound Care at home where she lives with her husband. She has multiple sclerosis. I saw her the first time she had 4 punched-out small wounds on  the anterior lateral left leg it appears that she is now down to 2. She also had wounds on the lateral and medial aspect of her right leg which were also small and punched out. The only one that remains is on the right lateral. Because of the nature of her wound I went ahead and ordered formal arterial studies this showed an ABI of 1 on the right and one on the left. TBI of 1.4 on the right and 0.79 on the left. She had normal waveforms. She was in the emergency room on 02/28/16; there is a noted edema of her left leg after a fall. She had a duplex ultrasound that showed no evidence for an acute DVT from the left groin to the popliteal fossa. The study was limited in the calf veins due to edema. Also noticeable that she had a left inguinal enlarged lymph node up to 6.2 cm. She also had a left knee x-ray that showed no acute findings and a right foot x-ray that showed soft tissue swelling but no radiographic evidence of osteomyelitis. The patient does stop smoking 03-19-16 [name] presents today for evaluation of her bilateral lower extremity ulcerations. She states that she has not smoked in several weeks. She denies any pain or discomfort to bilateral lower extremities, tolerating compression therapy as ordered. 03/26/16; I have not seen this patient in 2 weeks however she has done very well with improvements in the areas in question. After we obtain normal arterial studies, increasing the 4-layer compression really seems to look done a nice job here. She has one open  area on the left lateral leg and one on the medial right leg. These have filled in nicely and are now superficial wounds that showed epithelialized. I note the left inguinal lymph node at 6.2 cm. I may need to refer this back to the patient's primary doctor. 04/03/16; patient has 2 remaining wounds 1 on the left lateral leg and one on the right medial leg. Both of filled in nicely since we are able to increase her from a 3 layer to a 4  layer compression. I am also following up on the lymph node notable on her left leg DVT rule out in November 04/10/16; area on the right lateral leg is healed. The area on the left leg is still open but looks considerably better with healthy granulation and less wound area. I12/20/17; last week we healed the patient doubt with regards to the wound on the right leg to her on compression stocking 20-30 mmHg as it turns out I don't think she had a stocking, predictably therefore she has reopened. The left leg as well as the wound on the lateral aspect. Been generally small line 04/24/16; we healed out her right leg 2 weeks ago to her own 20-30 mm compression stockings although as it turns out she didn't have these and did not purchase some apparently because of financial issues. The left leg wound is on the lateral aspect. Both of these wounds are just about healed. 05/01/16; her wounds on the left leg are healed out today. The area on the right leg is also healed. Vitamin diligent effort of our staff we have not been able to get any form of compression stocking to this patient. The orders for juxtalite's were given to home health this never materialized. We Tonelli, Alaze H. ([id number]) ordered compression stockings I don't think she was able to afford these. We had a discussion today with both the patient and her husband without these, will accumulate edema and the wounds will simply reopen again. 05/14/16 READMISSION this is a patient we healed out 2 weeks ago. As bilateral lower extremity venous wounds probably some degree of lymphedema. At the beginning in November I did a duplex ultrasound of her left leg that was negative for DVT but did show a lymph node in the left inguinal area presumably reactive. We've been using compression to her lower extremities eventually healed all her wounds on her bilateral calves but we were not able to get any form of compression stockings for her in spite of intense  efforts of our staff. She returns today with reopening on the left leg did not the right she has several superficial areas anteriorly but an actual frank ulcer on the lateral left calf. This is about the size of a dime or smaller. She does not really complain of pain. The patient has a history of PAD however arterial studies we did in November were normal. Her ABIs and Doppler waveforms were both within the normal limits. The patient has a history of [name]. In discussing things with her today it would appear that the opening was noted 2 weeks ago according to the patient. We gave her Tubigrip stockings when she left the clinic 05/21/16; she has not yet had the duplex ultrasound of her thigh, this is booked for Thursday. The wound on the left lateral lower leg is improved 05/28/16; her duplex ultrasound of the left leg specifically her left thigh did not show a DVT however did show hypoechogenic enlarged left inguinal lymph nodes up  to 6.9 x 3.7 x 6.8. This also showed in the one in November however maximal diameter was 6.2, at that point I thought these were reactive secondary to cellulitis however there is obviously a differential here. Her wound on the left lower leg is closed. She has a superficial open area on the base of her left fifth metatarsal head on the plantar foot. It looks as though she has some wrap injuries on the anterior leg 06/05/16; since she was last year she had a fall on 1/31. She was seen in the ER but sent back home. I think she is also been seen by her primary physician who picked up on the swelling and the lymphadenopathy. She is ordered a CT scan of the abdomen and pelvis however this will be without contrast because of stage IIIB renal insufficiency. Nevertheless this should be a start the workup to make sure there is not is more systemic problem here. She will probably need consideration of a biopsy of the lymph node in her inguinal area, this would need general surgery. As far as  her wounds are concern today the area on the left plantar foot is healed. She has a small weeping area on the left lower leg. The wrap injury from last week has largely Pardoxically there is a lot less edema in the left thigh. 06/12/16; CT scan is on Friday. She has 2 small open areas one on the lateral left lower leg and one on the anterior lower leg on the left. Both of these looks healthy. I reduced her compression last week to Kerlix and Coban this seems to have not a reasonable job 07/02/16; the patient is now in the skilled facility. I think this was arranged out of the ER when she fell at home although I'm not exactly sure. She is going for I think a laparotomy for the pelvic mass next week. All the wounds in the left leg have healed Readmission: 07/20/2020 upon evaluation today patient presents for reevaluation here in clinic unfortunately she is having issues with a significant stage III pressure ulcer to her left heel. This is something that occurred when she was in the hospital sick. And subsequently though she does not appear to have any new injury she is still trying to recover from the pressure injury which was quite severe during that time. I do not see any signs of infection right now which is great news. With that being said she has been require some debridement to try to clear this up as much as possible. We need to get the wound VAC cleared so that she can see overall healing and improvement that were looking for. The patient does have a history of hypertension, chronic kidney disease stage III, COPD, and multiple sclerosis. Currently she has been trying to do what she can to keep pressure off of her heel while in bed and in other locations as well. 08/04/20 upon evaluation today patient appears to be doing extremely well currently in regard to her wound on the heel. I do believe the Iodoflex has helped loosen things appear. Unfortunately she tells me that the home health nurse has not  been putting the Iodoflex on the wound. That is news to [place] and that they tell me they got supplies in the mail but they did not see the Iodoflex being used. Nonetheless I am wondering if this is just something that they missed or if it truly has not been. I did tell them to look  when they get home in their box of supplies to see if they saw the dressing which showed and what it should look like. 08/11/2020 upon evaluation today patient appears to be doing decently well in regard to her wound although unfortunately this is showing signs of some blue-green drainage consistent with Pseudomonas it does have a little worried in this regard. Fortunately there is no evidence of active infection which is great news. No fevers, chills, nausea, vomiting, or diarrhea. 08/18/2020 upon evaluation today patient appears to be doing well with regard to her wound. She has been tolerating the dressing changes without complication. Fortunately there is no signs of active infection at this time. No fevers, chills, nausea, vomiting, or diarrhea. 4/29; left heel wound pressure ulcer. Slightly smaller. 09/04/2020 upon evaluation today patient appears to be doing well with regard to her wound. This is actually on her heel and seems to be doing well other than the fact that is a little bit moist from excessive drainage. There does not appear to be any signs of active infection at this time. No fevers, chills, nausea, vomiting, or diarrhea.. 09/11/2020 upon evaluation today patient appears to be doing okay in regard to her wound. With that being said she still has quite a bit of drainage noted at this point. Fortunately there is no signs of active infection which is great news overall I am extremely pleased with where things stand today. I do think we need to try to do something a little bit more to help control the drainage a bit better. 5/23; left posterior heel. This actually looks quite good. Using Hydrofera Blue under  compression. Dimensions are smaller 09/26/2020 upon evaluation today patient appears to be doing well with regard to her heel ulcer. In fact this is showing signs of excellent improvement and overall I am extremely pleased with where things stand today. I do not see any evidence of infection which is great news and overall I feel like she is headed in the correct direction. 10/09/2020 upon evaluation today patient appears to be doing excellent in regard to her wound. She has been tolerating the dressing changes without complication. Fortunately there is no evidence of active infection which is also excellent news. No fevers, chills, nausea, vomiting, or diarrhea. Objective [name], [name] H. ([id number]) Constitutional Well-nourished and well-hydrated in no acute distress. Vitals Time Taken: 3:24 PM, Height: 64 in, Weight: 265 lbs, BMI: 45.5, Temperature: 98.9 F, Pulse: 85 bpm, Respiratory Rate: 18 breaths/min, Blood Pressure: 90/57 mmHg. Respiratory normal breathing without difficulty. Psychiatric this patient is able to make decisions and demonstrates good insight into disease process. Alert and Oriented x 3. pleasant and cooperative. General Notes: Patient's wound bed showed signs of good granulation epithelization at this point. I think she is making great progress there is no need for sharp debridement today and I believe the [facility] is doing an excellent job. The wrap she had on was definitely not standard she had Curlex and a cotton layer underneath then an Ace wrap followed by Coban over top. Again this was nontraditional but seems that the home health nurse was trying to make doing what she had nonetheless we were not advised that this was an issue and something that she would be doing. Either way overall I feel like that it did not have any detriment to the wound her leg other than it was not wrapped quite high enough but we do need to make sure they get the supplies and need for  the actual 3 layer compression wrap. Integumentary (Hair, Skin) Wound #12 status is Open. Original cause of wound was Pressure Injury. The date acquired was: 06/27/2020. The wound has been in treatment 11 weeks. The wound is located on the Left Calcaneus. The wound measures 2cm length x 2.5cm width x 0.2cm depth; 3.927cm^2 area and 0.785cm^3 volume. There is Fat Layer (Subcutaneous Tissue) exposed. There is no tunneling or undermining noted. There is a medium amount of serosanguineous drainage noted. The wound margin is thickened. There is medium (34-66%) red, pink, hyper - granulation within the wound bed. There is a small (1-33%) amount of necrotic tissue within the wound bed including Adherent Slough. Assessment Active Problems ICD-10 Pressure ulcer of left heel, stage 3 Essential (primary) hypertension Chronic kidney disease, stage 3 unspecified Chronic obstructive pulmonary disease, unspecified Multiple sclerosis Procedures Wound #12 Pre-procedure diagnosis of Wound #12 is a Pressure Ulcer located on the Left Calcaneus . There was a Three Layer Compression Therapy Procedure by Donnamarie Poag, RN. Post procedure Diagnosis Wound #12: Same as Pre-Procedure Plan Follow-up Appointments: Return Appointment in 1 week. Home Health: [facility] for wound care. May utilize formulary equivalent dressing for wound treatment orders unless otherwise specified. Home Health Nurse may visit PRN to address patient s wound care needs. - Please wrap up to 3 finger lengths below the knee--three layer compression wrap--layer one is protective cotton layer, second layer is soft stretch and third layer is coban--call wound center with questions M-F Scheduled days for dressing changes to be completed; exception, patient has scheduled wound care visit that day. - Mondays and Thursdays **Please direct any NON-WOUND related issues/requests for orders to patient's Primary  Care Physician. **If current dressing causes regression in wound condition, may D/C ordered dressing product/s and apply Normal Saline Moist Dressing daily until next [facility] or Other MD appointment. **Notify Wound Healing Center of regression in wound condition at [phone]. Edema Control - Lymphedema / Segmental Compressive Device / Other: Optional: One layer of unna paste to top of compression wrap (to act as an anchor). [name], [name] ([id number]) Elevate, Exercise Daily and Avoid Standing for Long Periods of Time. Elevate legs to the level of the heart and pump ankles as often as possible - ELEVATE LEGS Elevate leg(s) parallel to the floor when sitting. Off-Loading: Open toe surgical shoe Turn and reposition every 2 hours Other: - Keep pressure off left heel WOUND #12: - Calcaneus Wound Laterality: Left Cleanser: Normal Saline 2 x Per Week/30 Days Discharge Instructions: Wash your hands with soap and water. Remove old dressing, discard into plastic bag and place into trash. Cleanse the wound with Normal Saline prior to applying a clean dressing using gauze sponges, not tissues or cotton balls. Do not scrub or use excessive force. Pat dry using gauze sponges, not tissue or cotton balls. Peri-Wound Care: Desitin Maximum Strength Ointment 4 (oz) 2 x Per Week/30 Days Discharge Instructions: apply around perimeter of wound for protection Primary Dressing: Hydrofera Blue Ready Transfer Foam, 4x5 (in/in) (Generic) 2 x Per Week/30 Days Discharge Instructions: Apply Hydrofera Blue Ready to wound bed as directed Secondary Dressing: Xtrasorb Medium 4x5 (in/in) 2 x Per Week/30 Days Discharge Instructions: Apply to wound as directed. Do not cut. Compression Wrap: Profore Lite LF 3 Multilayer Compression Bandaging System 2 x Per Week/30 Days Discharge Instructions: Apply 3 multi-layer wrap as prescribed. 1. Would recommend currently that we going continue with the wound care  measures as  before and the patient is in agreement with the plan this includes the use of the [facility] dressing followed by [employer]. 2. I am also can recommend that we continue with a 3 layer compression wrap. 3. I would also suggest that the patient continue to elevate her legs much as possible to help keep edema under control. We will see patient back for reevaluation in 1 week here in the clinic. If anything worsens or changes patient will contact our office for additional recommendations. Electronic Signature(s) Signed: 10/09/2020 3:45:53 PM By: Worthy Keeler PA-C Entered By: Worthy Keeler on 10/09/2020 15:45:53 [name], [name] ([id number]) -------------------------------------------------------------------------------- SuperBill Details Patient Name: [name] Date of Service: 10/09/2020 Medical Record Number: [id number] Patient Account Number: [id number] Date of Birth/Sex: Jan 04, 1951 (69 y.o. F) Treating RN: Donnamarie Poag Primary Care Provider: [name] Other Clinician: Referring Provider: [name] Treating Provider/Extender: [name] in Treatment: 11 Diagnosis Coding ICD-10 Codes Code Description [phone] Pressure ulcer of left heel, stage 3 I10 Essential (primary) hypertension N18.30 Chronic kidney disease, stage 3 unspecified J44.9 Chronic obstructive pulmonary disease, unspecified G35 Multiple sclerosis Facility Procedures CPT4 Code: [id number] Description: (Facility Use Only) [phone] - [facility] LT LEG Modifier: Quantity: 1 Physician Procedures CPT4 Code: 7672094 Description: 70962 - WC PHYS LEVEL 3 - EST PT Modifier: Quantity: 1 CPT4 Code: Description: ICD-10 Diagnosis Description L89.623 Pressure ulcer of left heel, stage 3 I10 Essential (primary) hypertension N18.30 Chronic kidney disease, stage 3 unspecified J44.9 Chronic obstructive pulmonary disease, unspecified Modifier: Quantity: Electronic  Signature(s) Signed: 10/09/2020 3:46:03 PM By: Worthy Keeler PA-C Entered By: Worthy Keeler on 10/09/2020 15:46:03

## 2020-10-11 NOTE — Progress Notes (Signed)
[name], [name] ([id number]) Visit Report for 10/09/2020 Arrival Information Details Patient Name: [name], [name] Date of Service: 10/09/2020 3:00 PM Medical Record Number: [id number] Patient Account Number: [id number] Date of Birth/Sex: [birth year] (69 y.o. F) Treating RN: Carlene Coria Primary Care Corleen Otwell: [name] Other Clinician: Referring Brianni Manthe: [name] Treating Kloie Whiting/Extender: [name] in Treatment: 11 Visit Information History Since Last Visit All ordered tests and consults were completed: No Patient Arrived: Wheel Chair Added or deleted any medications: No Arrival Time: 15:17 Any new allergies or adverse reactions: No Accompanied By: husband Had a fall or experienced change in No Transfer Assistance: None activities of daily living that may affect Patient Identification Verified: Yes risk of falls: Secondary Verification Process Completed: Yes Signs or symptoms of abuse/neglect since last visito No Patient Requires Transmission-Based Precautions: No Hospitalized since last visit: No Patient Has Alerts: Yes Implantable device outside of the clinic excluding No cellular tissue based products placed in the center since last visit: Has Dressing in Place as Prescribed: Yes Has Compression in Place as Prescribed: Yes Pain Present Now: No Electronic Signature(s) Signed: 10/11/2020 3:34:38 PM By: Carlene Coria RN Entered By: Carlene Coria on 10/09/2020 15:24:15 Borghi, [name] ([id number]) -------------------------------------------------------------------------------- Clinic Level of Care Assessment Details Patient Name: [name] Date of Service: 10/09/2020 3:00 PM Medical Record Number: [id number] Patient Account Number: [id number] Date of Birth/Sex: 1950-09-14 (69 y.o. F) Treating RN: Donnamarie Poag Primary Care Chyan Carnero: [name] Other Clinician: Referring Brenleigh Collet: [name] Treating Gustave Lindeman/Extender: [name] in Treatment: 11 Clinic Level of Care Assessment Items TOOL 1 Quantity Score '[]'  - Use when EandM and Procedure is performed on INITIAL visit 0 ASSESSMENTS - Nursing Assessment / Reassessment '[]'  - General Physical Exam (combine w/ comprehensive assessment (listed just below) when performed on new 0 pt. evals) '[]'  - 0 Comprehensive Assessment (HX, ROS, Risk Assessments, Wounds Hx, etc.) ASSESSMENTS - Wound and Skin Assessment / Reassessment '[]'  - Dermatologic / Skin Assessment (not related to wound area) 0 ASSESSMENTS - Ostomy and/or Continence Assessment and Care '[]'  - Incontinence Assessment and Management 0 '[]'  - 0 Ostomy Care Assessment and Management (repouching, etc.) PROCESS - Coordination of Care '[]'  - Simple Patient / Family Education for ongoing care 0 '[]'  - 0 Complex (extensive) Patient / Family Education for ongoing care '[]'  - 0 Staff obtains Programmer, systems, Records, Test Results / Process Orders '[]'  - 0 Staff telephones HHA, Nursing Homes / Clarify orders / etc '[]'  - 0 Routine Transfer to another Facility (non-emergent condition) '[]'  - 0 Routine Hospital Admission (non-emergent condition) '[]'  - 0 New Admissions / Biomedical engineer / Ordering NPWT, Apligraf, etc. '[]'  - 0 Emergency Hospital Admission (emergent condition) PROCESS - Special Needs '[]'  - Pediatric / Minor Patient Management 0 '[]'  - 0 Isolation Patient Management '[]'  - 0 Hearing / Language / Visual special needs '[]'  - 0 Assessment of Community assistance (transportation, D/C planning, etc.) '[]'  - 0 Additional assistance / Altered mentation '[]'  - 0 Support Surface(s) Assessment (bed, cushion, seat, etc.) INTERVENTIONS - Miscellaneous '[]'  - External ear exam 0 '[]'  - 0 Patient Transfer (multiple staff / Civil Service fast streamer / Similar devices) '[]'  - 0 Simple Staple / Suture removal (25 or less) '[]'  - 0 Complex Staple / Suture removal (26 or more) '[]'  - 0 Hypo/Hyperglycemic Management (do not check if billed  separately) '[]'  - 0 Ankle / Brachial Index (ABI) - do not check if billed separately Has the patient been seen at the hospital within the  last three years: Yes Total Score: 0 Level Of Care: ____ [name] ([id number]) Electronic Signature(s) Signed: 10/09/2020 4:11:38 PM By: Donnamarie Poag Entered By: Donnamarie Poag on 10/09/2020 15:42:22 Hass, [name] ([id number]) -------------------------------------------------------------------------------- Compression Therapy Details Patient Name: [name] Date of Service: 10/09/2020 3:00 PM Medical Record Number: [id number] Patient Account Number: [id number] Date of Birth/Sex: 1950/06/05 (69 y.o. F) Treating RN: Donnamarie Poag Primary Care Merl Bommarito: [name] Other Clinician: Referring Rayvn Rickerson: [name] Treating Lindie Roberson/Extender: [name] in Treatment: 11 Compression Therapy Performed for Wound Assessment: Wound #12 Left Calcaneus Performed By: Clinician Donnamarie Poag, RN Compression Type: Three Layer Post Procedure Diagnosis Same as Pre-procedure Electronic Signature(s) Signed: 10/09/2020 4:11:38 PM By: Donnamarie Poag Entered By: Donnamarie Poag on 10/09/2020 15:39:41 Alsteen, [name] ([id number]) -------------------------------------------------------------------------------- Lower Extremity Assessment Details Patient Name: [name] Date of Service: 10/09/2020 3:00 PM Medical Record Number: [id number] Patient Account Number: [id number] Date of Birth/Sex: 1950/11/22 (69 y.o. F) Treating RN: Carlene Coria Primary Care Oliviagrace Crisanti: [name] Other Clinician: Referring Lycan Davee: [name] Treating Joelynn Dust/Extender: [name] in Treatment: 11 Edema Assessment Assessed: [Left: No] [Right: No] Edema: [Left: Ye] [Right: s] Calf Left: Right: Point of Measurement: 36 cm From Medial Instep 33 cm Ankle Left: Right: Point of Measurement: 10 cm From Medial Instep 20 cm Vascular  Assessment Pulses: Dorsalis Pedis Palpable: [Left:Yes] Electronic Signature(s) Signed: 10/11/2020 3:34:38 PM By: Carlene Coria RN Entered By: Carlene Coria on 10/09/2020 15:34:29 Lemar, [name] ([id number]) -------------------------------------------------------------------------------- Multi Wound Chart Details Patient Name: [name] Date of Service: 10/09/2020 3:00 PM Medical Record Number: [id number] Patient Account Number: [id number] Date of Birth/Sex: 1950/11/12 (69 y.o. F) Treating RN: Donnamarie Poag Primary Care Jilian West: [name] Other Clinician: Referring Neelah Mannings: [name] Treating Karriem Muench/Extender: [name] in Treatment: 11 Vital Signs Height(in): 17 Pulse(bpm): 28 Weight(lbs): 265 Blood Pressure(mmHg): 90/57 Body Mass Index(BMI): 45 Temperature(F): 98.9 Respiratory Rate(breaths/min): 18 Photos: [N/A:N/A] Wound Location: Left Calcaneus N/A N/A Wounding Event: Pressure Injury N/A N/A Primary Etiology: Pressure Ulcer N/A N/A Comorbid History: Cataracts, Chronic Obstructive N/A N/A Pulmonary Disease (COPD), Sleep Apnea, Hypertension, Osteoarthritis, Neuropathy Date Acquired: 06/27/2020 N/A N/A Weeks of Treatment: 11 N/A N/A Wound Status: Open N/A N/A Measurements L x W x D (cm) 2x2.5x0.2 N/A N/A Area (cm) : 3.927 N/A N/A Volume (cm) : 0.785 N/A N/A % Reduction in Area: 23.10% N/A N/A % Reduction in Volume: 23.10% N/A N/A Classification: Category/Stage III N/A N/A Exudate Amount: Medium N/A N/A Exudate Type: Serosanguineous N/A N/A Exudate Color: red, brown N/A N/A Wound Margin: Thickened N/A N/A Granulation Amount: Medium (34-66%) N/A N/A Granulation Quality: Red, Pink, Hyper-granulation N/A N/A Necrotic Amount: Small (1-33%) N/A N/A Exposed Structures: Fat Layer (Subcutaneous Tissue): N/A N/A Yes Fascia: No Tendon: No Muscle: No Joint: No Bone: No Epithelialization: None N/A N/A Treatment Notes Electronic  Signature(s) Signed: 10/09/2020 4:11:38 PM By: Donnamarie Poag Entered By: Donnamarie Poag on 10/09/2020 15:39:21 [name] ([id number]) -------------------------------------------------------------------------------- Multi-Disciplinary Care Plan Details Patient Name: [name] Date of Service: 10/09/2020 3:00 PM Medical Record Number: [id number] Patient Account Number: [id number] Date of Birth/Sex: 01-Apr-1951 (69 y.o. F) Treating RN: Donnamarie Poag Primary Care Kayin Osment: [name] Other Clinician: Referring Elfreida Heggs: [name] Treating Correne Lalani/Extender: [name] in Treatment: 11 Active Inactive Wound/Skin Impairment Nursing Diagnoses: Impaired tissue integrity Goals: Patient/caregiver will verbalize understanding of skin care regimen Date Initiated: 07/20/2020 Date Inactivated: 09/04/2020 Target Resolution Date: 09/19/2020 Goal Status: Met Ulcer/skin breakdown will have a volume reduction of  30% by week 4 Date Initiated: 07/20/2020 Date Inactivated: 08/25/2020 Target Resolution Date: 08/20/2020 Goal Status: Met Ulcer/skin breakdown will have a volume reduction of 50% by week 8 Date Initiated: 07/20/2020 Date Inactivated: 10/09/2020 Target Resolution Date: 09/19/2020 Goal Status: Met Ulcer/skin breakdown will have a volume reduction of 80% by week 12 Date Initiated: 07/20/2020 Target Resolution Date: 10/20/2020 Goal Status: Active Ulcer/skin breakdown will heal within 14 weeks Date Initiated: 07/20/2020 Target Resolution Date: 11/19/2020 Goal Status: Active Interventions: Assess patient/caregiver ability to obtain necessary supplies Assess patient/caregiver ability to perform ulcer/skin care regimen upon admission and as needed Provide education on ulcer and skin care Treatment Activities: Referred to DME Malachai Schalk for dressing supplies : 07/20/2020 Skin care regimen initiated : 07/20/2020 Notes: Electronic Signature(s) Signed: 10/09/2020 4:11:38 PM By:  Donnamarie Poag Entered By: Donnamarie Poag on 10/09/2020 15:39:08 Wellnitz, [name] ([id number]) -------------------------------------------------------------------------------- Pain Assessment Details Patient Name: [name] Date of Service: 10/09/2020 3:00 PM Medical Record Number: [id number] Patient Account Number: [id number] Date of Birth/Sex: Jan 15, 1951 (69 y.o. F) Treating RN: Carlene Coria Primary Care Taysen Bushart: [name] Other Clinician: Referring Niah Heinle: [name] Treating Makynzee Tigges/Extender: [name] in Treatment: 11 Active Problems Location of Pain Severity and Description of Pain Patient Has Paino No Site Locations Pain Management and Medication Current Pain Management: Electronic Signature(s) Signed: 10/11/2020 3:34:38 PM By: Carlene Coria RN Entered By: Carlene Coria on 10/09/2020 15:24:54 [name] ([id number]) -------------------------------------------------------------------------------- Patient/Caregiver Education Details Patient Name: [name] Date of Service: 10/09/2020 3:00 PM Medical Record Number: [id number] Patient Account Number: [id number] Date of Birth/Gender: [birth year] (69 y.o. F) Treating RN: Donnamarie Poag Primary Care Physician: [name] Other Clinician: Referring Physician: [name] Treating Physician/Extender: [name] in Treatment: 11 Education Assessment Education Provided To: Patient Education Topics Provided Wound/Skin Impairment: Electronic Signature(s) Signed: 10/09/2020 4:11:38 PM By: Donnamarie Poag Entered By: Donnamarie Poag on 10/09/2020 15:42:43 Karnik, [name] ([id number]) -------------------------------------------------------------------------------- Wound Assessment Details Patient Name: [name] Date of Service: 10/09/2020 3:00 PM Medical Record Number: [id number] Patient Account Number: [id number] Date of Birth/Sex: August 08, 1950 (69 y.o. F) Treating RN: Carlene Coria Primary  Care Rachell Druckenmiller: [name] Other Clinician: Referring Noe Goyer: [name] Treating Laurel Harnden/Extender: [name] in Treatment: 11 Wound Status Wound Number: 12 Primary Pressure Ulcer Etiology: Wound Location: Left Calcaneus Wound Open Wounding Event: Pressure Injury Status: Date Acquired: 06/27/2020 Comorbid Cataracts, Chronic Obstructive Pulmonary Disease Weeks Of Treatment: 11 History: (COPD), Sleep Apnea, Hypertension, Osteoarthritis, Clustered Wound: No Neuropathy Photos Wound Measurements Length: (cm) 2 Width: (cm) 2.5 Depth: (cm) 0.2 Area: (cm) 3.927 Volume: (cm) 0.785 % Reduction in Area: 23.1% % Reduction in Volume: 23.1% Epithelialization: None Tunneling: No Undermining: No Wound Description Classification: Category/Stage III Wound Margin: Thickened Exudate Amount: Medium Exudate Type: Serosanguineous Exudate Color: red, brown Foul Odor After Cleansing: No Slough/Fibrino Yes Wound Bed Granulation Amount: Medium (34-66%) Exposed Structure Granulation Quality: Red, Pink, Hyper-granulation Fascia Exposed: No Necrotic Amount: Small (1-33%) Fat Layer (Subcutaneous Tissue) Exposed: Yes Necrotic Quality: Adherent Slough Tendon Exposed: No Muscle Exposed: No Joint Exposed: No Bone Exposed: No Electronic Signature(s) Signed: 10/11/2020 3:34:38 PM By: Carlene Coria RN Entered By: Carlene Coria on 10/09/2020 15:33:24 Burdine, [name] ([id number]) -------------------------------------------------------------------------------- Vitals Details Patient Name: [name] Date of Service: 10/09/2020 3:00 PM Medical Record Number: [id number] Patient Account Number: [id number] Date of Birth/Sex: November 12, 1950 (69 y.o. F) Treating RN: Carlene Coria Primary Care Mea Ozga: [name] Other Clinician: Referring Sahithi Ordoyne: [name] Treating Mallisa Alameda/Extender: [name] in Treatment: [phone]  Vital Signs Time Taken: 15:24 Temperature  (F): 98.9 Height (in): 64 Pulse (bpm): 85 Weight (lbs): 265 Respiratory Rate (breaths/min): 18 Body Mass Index (BMI): 45.5 Blood Pressure (mmHg): 90/57 Reference Range: 80 - 120 mg / dl Electronic Signature(s) Signed: 10/11/2020 3:34:38 PM By: Carlene Coria RN Entered By: Carlene Coria on 10/09/2020 15:24:46

## 2020-10-16 ENCOUNTER — Other Ambulatory Visit: Payer: Self-pay

## 2020-10-16 ENCOUNTER — Encounter: Payer: Medicare Other | Admitting: Physician Assistant

## 2020-10-16 DIAGNOSIS — L89623 Pressure ulcer of left heel, stage 3: Secondary | ICD-10-CM | POA: Diagnosis not present

## 2020-10-16 NOTE — Progress Notes (Addendum)
VERNEAL, WIERS ([id number]) Visit Report for 10/16/2020 Chief Complaint Document Details Patient Name: [name] Date of Service: 10/16/2020 2:30 PM Medical Record Number: [id number] Patient Account Number: [id number] Date of Birth/Sex: [birth year] (69 y.o. F) Treating RN: Donnamarie Poag Primary Care Provider: [name] Other Clinician: Referring Provider: [name] Treating Provider/Extender: [name] in Treatment: 12 Information Obtained from: Patient Chief Complaint Left Heel Pressure Ulcer Electronic Signature(s) Signed: 10/16/2020 3:17:52 PM By: Worthy Keeler PA-C Entered By: Worthy Keeler on 10/16/2020 15:17:51 [name] ([id number]) -------------------------------------------------------------------------------- HPI Details Patient Name: [name] Date of Service: 10/16/2020 2:30 PM Medical Record Number: [id number] Patient Account Number: [id number] Date of Birth/Sex: Aug 18, 1950 (69 y.o. F) Treating RN: Donnamarie Poag Primary Care Provider: [name] Other Clinician: Referring Provider: [name] Treating Provider/Extender: [name] in Treatment: 12 History of Present Illness HPI Description: 70 year old with a past medical history significant for MS, urinary incontinence, and obesity. She has been seen in the wound clinic before for lower extremity ulcerations treated with compression therapy. she is also known to have hypertension, peripheral vascular disease, COPD, obstructive sleep apnea, lumbar radiculopathy, kyphoscoliosis, urinary issues and tobacco abuse. Smokes a packet of cigarettes a day was recently seen at the [facility] for swelling of her legs and feet with a ulceration on the dorsum of the right foot which has been there for about 6 months. she was recently in the ER about a month ago where she was seen for shortness of breath and swelling of the legs and a chest x-ray was within  normal limits had an increase in her BNP and was given Lasix and put on doxycycline for a mild cellulitis and possible UTI. Wounds aresmaller today 11/13/15. Debrided and will continue Santyl. 12/21/2015 -- she was admitted to the hospital overnight on 12/18/2015 and diagnosed with urinary retention and cellulitis of the left lower leg. is past to take clindamycin and use Santyl for her wounds. 01/15/2016 -- come back to see [place] for almost a month and continues to be noncompliant with her dressings 01/30/16 patient presents today for a follow-up visit concerning her ongoing bilateral lower extremity wounds. We have not seen her for the past 2 weeks although we are supposed to be seen her for weekly visits. She currently has a Foley catheter. She tells me that the wounds are intensely painful especially with pressure and palpation at this point in time. Fortunately she is having no interval signs or symptoms of systemic infection but unfortunately the wounds have not improved dramatically since we last saw her. She does have home health coming to take care of her as well. She is currently not in any compression wraps. 02/06/16 ON evaluation today patient continues to experience discomfort regarding her bilateral lower extremity ulcers. She has continued to tolerate the dressing changes at this point in time and continues to have a Foley catheter as well. Fortunately she is back this week in the past she has been somewhat noncompliant with follow-ups I'm glad to see her today. She tells me that her pain level varies but can be as high as a 7 out of 10 with manipulation of the wound. She tells me that she used to be on oxycodone which was managed by the pain clinic although she is no longer on that as she tells me that she was actually smoking marijuana at the time and when this was found out they discontinued her pain medication. She no longer  is taking anything pain medication wise and she also does not  smoke cigarettes nor marijuana at this point. 02/12/16; this is a patient I don't believe I have previously seen. She has multiple sclerosis. She has 4 punched-out areas on the anterior lateral left leg 2 areas on the right these are all in the same condition. Reasonably small [dime size] wounds each was some depth. Surface of these wounds does not look particularly healthy as there is adherent slough. There is no evidence of surrounding infection or inflammation. ABIs in this clinic were 0.87 on the right and 0.81 on the left. She is listed as having PAD and is a smoker. Not a diabetic 02/20/16 patient I gave antibiotics to last week for erythema around both wound sites on the left lateral leg and right lateral leg. This appears to be a lot better. One of the areas on the left leg is healed however she still has 3 punched-out open areas on the left lateral calf and one on the right lateral calf and one on the dorsal foot. She is an ex-smoker quitting 1 month ago. ABIs in this clinic were 0.87 on the right 0.81 on the left 03/12/16; this is a patient I really don't have a good sense of. It would appear for the first 5 or 6 weeks of her stay in this clinic she was cared for by Dr. Con Memos. She appeared on my schedule in mid October and I saw her twice. She has not been here however in 3 weeks. She has advanced Wound Care at home where she lives with her husband. She has multiple sclerosis. I saw her the first time she had 4 punched-out small wounds on the anterior lateral left leg it appears that she is now down to 2. She also had wounds on the lateral and medial aspect of her right leg which were also small and punched out. The only one that remains is on the right lateral. Because of the nature of her wound I went ahead and ordered formal arterial studies this showed an ABI of 1 on the right and one on the left. TBI of 1.4 on the right and 0.79 on the left. She had normal waveforms. She was in the  emergency room on 02/28/16; there is a noted edema of her left leg after a fall. She had a duplex ultrasound that showed no evidence for an acute DVT from the left groin to the popliteal fossa. The study was limited in the calf veins due to edema. Also noticeable that she had a left inguinal enlarged lymph node up to 6.2 cm. She also had a left knee x-ray that showed no acute findings and a right foot x-ray that showed soft tissue swelling but no radiographic evidence of osteomyelitis. The patient does stop smoking 03-19-16 Ms Beaupre presents today for evaluation of her bilateral lower extremity ulcerations. She states that she has not smoked in several weeks. She denies any pain or discomfort to bilateral lower extremities, tolerating compression therapy as ordered. 03/26/16; I have not seen this patient in 2 weeks however she has done very well with improvements in the areas in question. After we obtain normal arterial studies, increasing the 4-layer compression really seems to look done a nice job here. She has one open area on the left lateral leg and one on the medial right leg. These have filled in nicely and are now superficial wounds that showed epithelialized. I note the left inguinal lymph node at  6.2 cm. I may need to refer this back to the patient's primary doctor. 04/03/16; patient has 2 remaining wounds 1 on the left lateral leg and one on the right medial leg. Both of filled in nicely since we are able to increase her from a 3 layer to a 4 layer compression. I am also following up on the lymph node notable on her left leg DVT rule out in November 04/10/16; area on the right lateral leg is healed. The area on the left leg is still open but looks considerably better with healthy granulation and less wound area. I12/20/17; last week we healed the patient doubt with regards to the wound on the right leg to her on compression stocking 20-30 mmHg as it turns out I don't think she had a stocking,  predictably therefore she has reopened. The left leg as well as the wound on the lateral aspect. Been generally small line 04/24/16; we healed out her right leg 2 weeks ago to her own 20-30 mm compression stockings although as it turns out she didn't have these and did not purchase some apparently because of financial issues. The left leg wound is on the lateral aspect. Both of these wounds are just about healed. 05/01/16; her wounds on the left leg are healed out today. The area on the right leg is also healed. Vitamin diligent effort of our staff we have not been able to get any form of compression stocking to this patient. The orders for juxtalite's were given to home health this never materialized. We ordered compression stockings I don't think she was able to afford these. We had a discussion today with both the patient and her husband without these, will accumulate edema and the wounds will simply reopen again. 05/14/16 READMISSION Beverley, [place] ([id number]) this is a patient we healed out 2 weeks ago. As bilateral lower extremity venous wounds probably some degree of lymphedema. At the beginning in November I did a duplex ultrasound of her left leg that was negative for DVT but did show a lymph node in the left inguinal area presumably reactive. We've been using compression to her lower extremities eventually healed all her wounds on her bilateral calves but we were not able to get any form of compression stockings for her in spite of intense efforts of our staff. She returns today with reopening on the left leg did not the right she has several superficial areas anteriorly but an actual frank ulcer on the lateral left calf. This is about the size of a dime or smaller. She does not really complain of pain. The patient has a history of PAD however arterial studies we did in November were normal. Her ABIs and Doppler waveforms were both within the normal limits. The patient has a history of MS.  In discussing things with her today it would appear that the opening was noted 2 weeks ago according to the patient. We gave her Tubigrip stockings when she left the clinic 05/21/16; she has not yet had the duplex ultrasound of her thigh, this is booked for Thursday. The wound on the left lateral lower leg is improved 05/28/16; her duplex ultrasound of the left leg specifically her left thigh did not show a DVT however did show hypoechogenic enlarged left inguinal lymph nodes up to 6.9 x 3.7 x 6.8. This also showed in the one in November however maximal diameter was 6.2, at that point I thought these were reactive secondary to cellulitis however there is obviously  a differential here. Her wound on the left lower leg is closed. She has a superficial open area on the base of her left fifth metatarsal head on the plantar foot. It looks as though she has some wrap injuries on the anterior leg 06/05/16; since she was last year she had a fall on 1/31. She was seen in the ER but sent back home. I think she is also been seen by her primary physician who picked up on the swelling and the lymphadenopathy. She is ordered a CT scan of the abdomen and pelvis however this will be without contrast because of stage IIIB renal insufficiency. Nevertheless this should be a start the workup to make sure there is not is more systemic problem here. She will probably need consideration of a biopsy of the lymph node in her inguinal area, this would need general surgery. As far as her wounds are concern today the area on the left plantar foot is healed. She has a small weeping area on the left lower leg. The wrap injury from last week has largely Pardoxically there is a lot less edema in the left thigh. 06/12/16; CT scan is on Friday. She has 2 small open areas one on the lateral left lower leg and one on the anterior lower leg on the left. Both of these looks healthy. I reduced her compression last week to Kerlix and Coban this  seems to have not a reasonable job 07/02/16; the patient is now in the skilled facility. I think this was arranged out of the ER when she fell at home although I'm not exactly sure. She is going for I think a laparotomy for the pelvic mass next week. All the wounds in the left leg have healed Readmission: 07/20/2020 upon evaluation today patient presents for reevaluation here in clinic unfortunately she is having issues with a significant stage III pressure ulcer to her left heel. This is something that occurred when she was in the hospital sick. And subsequently though she does not appear to have any new injury she is still trying to recover from the pressure injury which was quite severe during that time. I do not see any signs of infection right now which is great news. With that being said she has been require some debridement to try to clear this up as much as possible. We need to get the wound VAC cleared so that she can see overall healing and improvement that were looking for. The patient does have a history of hypertension, chronic kidney disease stage III, COPD, and multiple sclerosis. Currently she has been trying to do what she can to keep pressure off of her heel while in bed and in other locations as well. 08/04/20 upon evaluation today patient appears to be doing extremely well currently in regard to her wound on the heel. I do believe the Iodoflex has helped loosen things appear. Unfortunately she tells me that the home health nurse has not been putting the Iodoflex on the wound. That is news to [place] and that they tell me they got supplies in the mail but they did not see the Iodoflex being used. Nonetheless I am wondering if this is just something that they missed or if it truly has not been. I did tell them to look when they get home in their box of supplies to see if they saw the dressing which showed and what it should look like. 08/11/2020 upon evaluation today patient appears to be  doing decently  well in regard to her wound although unfortunately this is showing signs of some blue-green drainage consistent with Pseudomonas it does have a little worried in this regard. Fortunately there is no evidence of active infection which is great news. No fevers, chills, nausea, vomiting, or diarrhea. 08/18/2020 upon evaluation today patient appears to be doing well with regard to her wound. She has been tolerating the dressing changes without complication. Fortunately there is no signs of active infection at this time. No fevers, chills, nausea, vomiting, or diarrhea. 4/29; left heel wound pressure ulcer. Slightly smaller. 09/04/2020 upon evaluation today patient appears to be doing well with regard to her wound. This is actually on her heel and seems to be doing well other than the fact that is a little bit moist from excessive drainage. There does not appear to be any signs of active infection at this time. No fevers, chills, nausea, vomiting, or diarrhea.. 09/11/2020 upon evaluation today patient appears to be doing okay in regard to her wound. With that being said she still has quite a bit of drainage noted at this point. Fortunately there is no signs of active infection which is great news overall I am extremely pleased with where things stand today. I do think we need to try to do something a little bit more to help control the drainage a bit better. 5/23; left posterior heel. This actually looks quite good. Using Hydrofera Blue under compression. Dimensions are smaller 09/26/2020 upon evaluation today patient appears to be doing well with regard to her heel ulcer. In fact this is showing signs of excellent improvement and overall I am extremely pleased with where things stand today. I do not see any evidence of infection which is great news and overall I feel like she is headed in the correct direction. 10/09/2020 upon evaluation today patient appears to be doing excellent in regard to  her wound. She has been tolerating the dressing changes without complication. Fortunately there is no evidence of active infection which is also excellent news. No fevers, chills, nausea, vomiting, or diarrhea. 10/16/2020 upon evaluation today patient appears to be doing well with regard to her heel ulcer. Unfortunately she is having issues with an area of deep tissue injury/pressure injury over the shin due to the wrap being on way too tight today. Fortunately there does not appear to be any signs of infection currently. No fevers, chills, nausea, vomiting, or diarrhea. Electronic Signature(s) Signed: 10/16/2020 5:28:44 PM By: Worthy Keeler PA-C Entered By: Worthy Keeler on 10/16/2020 17:28:43 [name] ([id number]) -------------------------------------------------------------------------------- Physical Exam Details Patient Name: [name] Date of Service: 10/16/2020 2:30 PM Medical Record Number: [id number] Patient Account Number: [id number] Date of Birth/Sex: February 23, 1951 (69 y.o. F) Treating RN: Donnamarie Poag Primary Care Provider: [name] Other Clinician: Referring Provider: [name] Treating Provider/Extender: [name] in Treatment: 1 Constitutional Well-nourished and well-hydrated in no acute distress. Respiratory normal breathing without difficulty. Psychiatric this patient is able to make decisions and demonstrates good insight into disease process. Alert and Oriented x 3. pleasant and cooperative. Notes Patient still actually appears to be doing quite well which I am very pleased about. There does not appear to be any signs of active infection at this time which is great news. No fevers, chills, nausea, vomiting, or diarrhea. Unfortunately the deep tissue injury over the anterior portion of the shin is what I am most concerned about at this point to be honest. The good news is the patient  does not seem to be having any signs of  infection or really open wound yet although I am concerned that this is going to develop into an open wound to be honest. Electronic Signature(s) Signed: 10/16/2020 5:29:32 PM By: Worthy Keeler PA-C Entered By: Worthy Keeler on 10/16/2020 17:29:32 [name] ([id number]) -------------------------------------------------------------------------------- Physician Orders Details Patient Name: [name] Date of Service: 10/16/2020 2:30 PM Medical Record Number: [id number] Patient Account Number: [id number] Date of Birth/Sex: 07-02-1950 (69 y.o. F) Treating RN: [name] Primary Care Provider: [name] Other Clinician: Referring Provider: [name] Treating Provider/Extender: [name] in Treatment: 12 Verbal / Phone Orders: No Diagnosis Coding ICD-10 Coding Code Description [phone] Pressure ulcer of left heel, stage 3 I10 Essential (primary) hypertension N18.30 Chronic kidney disease, stage 3 unspecified J44.9 Chronic obstructive pulmonary disease, unspecified G35 Multiple sclerosis Follow-up Appointments o Return Appointment in 1 week. [place]: - [facility] [place] for wound care. May utilize formulary equivalent dressing for wound treatment orders unless otherwise specified. Home Health Nurse may visit PRN to address patientos wound care needs. o Scheduled days for dressing changes to be completed; exception, patient has scheduled wound care visit that day. - Mondays and Thursdays o **Please direct any NON-WOUND related issues/requests for orders to patient's Primary Care Physician. **If current dressing causes regression in wound condition, may D/C ordered dressing product/s and apply Normal Saline Moist Dressing daily until next [place] or Other MD appointment. **Notify Wound Healing Center of regression in wound condition at [phone]. Edema Control - Lymphedema / Segmental  Compressive Device / Other Bilateral Lower Extremities o Elevate, Exercise Daily and Avoid Standing for Long Periods of Time. o Elevate legs to the level of the heart and pump ankles as often as possible - ELEVATE LEGS o Elevate leg(s) parallel to the floor when sitting. Off-Loading Left Lower Extremity o Open toe surgical shoe o Turn and reposition every 2 hours o Other: - Keep pressure off left heel Wound Treatment Wound #12 - Calcaneus Wound Laterality: Left Cleanser: Soap and Water 2 x Per Week/30 Days Discharge Instructions: Gently cleanse wound with antibacterial soap, rinse and pat dry prior to dressing wounds Peri-Wound Care: Desitin Maximum Strength Ointment 4 (oz) 2 x Per Week/30 Days Discharge Instructions: Apply around perimeter of wound for protection Primary Dressing: Hydrofera Blue Ready Transfer Foam, 4x5 (in/in) (Generic) 2 x Per Week/30 Days Discharge Instructions: Apply Hydrofera Blue Ready to wound bed as directed Secondary Dressing: ABD Pad 5x9 (in/in) 2 x Per Week/30 Days Discharge Instructions: Line ABD pads on shin area Secondary Dressing: Xtrasorb Medium 4x5 (in/in) 2 x Per Week/30 Days Discharge Instructions: Apply to wound as directed. Do not cut. Secured With: 96M Medipore H Soft Cloth Surgical Tape, 2x2 (in/yd) 2 x Per Week/30 Days Discharge Instructions: Secure kerlix Secured With: Kerlix Roll Sterile or Non-Sterile 6-ply 4.5x4 (yd/yd) 2 x Per Week/30 Days Kernes, Dorise H. ([id number]) Discharge Instructions: Apply Kerlix from base of toes to 3-finger-widths below knee Secured With: Tubigrip Size D, 3x10 (in/yd) 2 x Per Week/30 Days Discharge Instructions: Apply 3 Tubigrip D 3-finger-widths below knee to base of toes to secure dressing and/or for swelling. (extra one sent with patient) Electronic Signature(s) Signed: 10/16/2020 4:29:35 PM By: Georges Mouse, Minus Breeding RN Signed: 10/16/2020 6:32:22 PM By: Worthy Keeler PA-C Entered By: Georges Mouse, Minus Breeding on 10/16/2020 15:57:21 Minahan, [name] ([id number]) -------------------------------------------------------------------------------- Problem List Details Patient Name: Shawnie Pons  H. Date of Service: 10/16/2020 2:30 PM Medical Record Number: [id number] Patient Account Number: [id number] Date of Birth/Sex: [birth year] (70 y.o. F) Treating RN: Donnamarie Poag Primary Care Provider: [name] Other Clinician: Referring Provider: [name] Treating Provider/Extender: [name] in Treatment: 12 Active Problems ICD-10 Encounter Code Description Active Date MDM Diagnosis [phone] Pressure ulcer of left heel, stage 3 07/20/2020 No Yes I10 Essential (primary) hypertension 07/20/2020 No Yes N18.30 Chronic kidney disease, stage 3 unspecified 07/20/2020 No Yes J44.9 Chronic obstructive pulmonary disease, unspecified 07/20/2020 No Yes G35 Multiple sclerosis 07/20/2020 No Yes Inactive Problems Resolved Problems Electronic Signature(s) Signed: 10/16/2020 3:17:41 PM By: Worthy Keeler PA-C Entered By: Worthy Keeler on 10/16/2020 15:17:41 [name] ([id number]) -------------------------------------------------------------------------------- Progress Note Details Patient Name: [name] Date of Service: 10/16/2020 2:30 PM Medical Record Number: [id number] Patient Account Number: [id number] Date of Birth/Sex: Jun 02, 1950 (69 y.o. F) Treating RN: Donnamarie Poag Primary Care Provider: [name] Other Clinician: Referring Provider: [name] Treating Provider/Extender: [name] in Treatment: 12 Subjective Chief Complaint Information obtained from Patient Left Heel Pressure Ulcer History of Present Illness (HPI) 70 year old with a past medical history significant for MS, urinary incontinence, and obesity. She has been seen in the wound clinic before for lower extremity ulcerations treated with compression therapy. she is also known to  have hypertension, peripheral vascular disease, COPD, obstructive sleep apnea, lumbar radiculopathy, kyphoscoliosis, urinary issues and tobacco abuse. Smokes a packet of cigarettes a day was recently seen at the [facility] for swelling of her legs and feet with a ulceration on the dorsum of the right foot which has been there for about 6 months. she was recently in the ER about a month ago where she was seen for shortness of breath and swelling of the legs and a chest x-ray was within normal limits had an increase in her BNP and was given Lasix and put on doxycycline for a mild cellulitis and possible UTI. Wounds aresmaller today 11/13/15. Debrided and will continue Santyl. 12/21/2015 -- she was admitted to the hospital overnight on 12/18/2015 and diagnosed with urinary retention and cellulitis of the left lower leg. is past to take clindamycin and use Santyl for her wounds. 01/15/2016 -- come back to see [place] for almost a month and continues to be noncompliant with her dressings 01/30/16 patient presents today for a follow-up visit concerning her ongoing bilateral lower extremity wounds. We have not seen her for the past 2 weeks although we are supposed to be seen her for weekly visits. She currently has a Foley catheter. She tells me that the wounds are intensely painful especially with pressure and palpation at this point in time. Fortunately she is having no interval signs or symptoms of systemic infection but unfortunately the wounds have not improved dramatically since we last saw her. She does have home health coming to take care of her as well. She is currently not in any compression wraps. 02/06/16 ON evaluation today patient continues to experience discomfort regarding her bilateral lower extremity ulcers. She has continued to tolerate the dressing changes at this point in time and continues to have a Foley catheter as well. Fortunately she is back this week in the past she has  been somewhat noncompliant with follow-ups I'm glad to see her today. She tells me that her pain level varies but can be as high as a 7 out of 10 with manipulation of the wound. She tells me that she used to be  on oxycodone which was managed by the pain clinic although she is no longer on that as she tells me that she was actually smoking marijuana at the time and when this was found out they discontinued her pain medication. She no longer is taking anything pain medication wise and she also does not smoke cigarettes nor marijuana at this point. 02/12/16; this is a patient I don't believe I have previously seen. She has multiple sclerosis. She has 4 punched-out areas on the anterior lateral left leg 2 areas on the right these are all in the same condition. Reasonably small [dime size] wounds each was some depth. Surface of these wounds does not look particularly healthy as there is adherent slough. There is no evidence of surrounding infection or inflammation. ABIs in this clinic were 0.87 on the right and 0.81 on the left. She is listed as having PAD and is a smoker. Not a diabetic 02/20/16 patient I gave antibiotics to last week for erythema around both wound sites on the left lateral leg and right lateral leg. This appears to be a lot better. One of the areas on the left leg is healed however she still has 3 punched-out open areas on the left lateral calf and one on the right lateral calf and one on the dorsal foot. She is an ex-smoker quitting 1 month ago. ABIs in this clinic were 0.87 on the right 0.81 on the left 03/12/16; this is a patient I really don't have a good sense of. It would appear for the first 5 or 6 weeks of her stay in this clinic she was cared for by Dr. Con Memos. She appeared on my schedule in mid October and I saw her twice. She has not been here however in 3 weeks. She has advanced Wound Care at home where she lives with her husband. She has multiple sclerosis. I saw her the first  time she had 4 punched-out small wounds on the anterior lateral left leg it appears that she is now down to 2. She also had wounds on the lateral and medial aspect of her right leg which were also small and punched out. The only one that remains is on the right lateral. Because of the nature of her wound I went ahead and ordered formal arterial studies this showed an ABI of 1 on the right and one on the left. TBI of 1.4 on the right and 0.79 on the left. She had normal waveforms. She was in the emergency room on 02/28/16; there is a noted edema of her left leg after a fall. She had a duplex ultrasound that showed no evidence for an acute DVT from the left groin to the popliteal fossa. The study was limited in the calf veins due to edema. Also noticeable that she had a left inguinal enlarged lymph node up to 6.2 cm. She also had a left knee x-ray that showed no acute findings and a right foot x-ray that showed soft tissue swelling but no radiographic evidence of osteomyelitis. The patient does stop smoking 03-19-16 Ms Toppins presents today for evaluation of her bilateral lower extremity ulcerations. She states that she has not smoked in several weeks. She denies any pain or discomfort to bilateral lower extremities, tolerating compression therapy as ordered. 03/26/16; I have not seen this patient in 2 weeks however she has done very well with improvements in the areas in question. After we obtain normal arterial studies, increasing the 4-layer compression really seems to look done  a nice job here. She has one open area on the left lateral leg and one on the medial right leg. These have filled in nicely and are now superficial wounds that showed epithelialized. I note the left inguinal lymph node at 6.2 cm. I may need to refer this back to the patient's primary doctor. 04/03/16; patient has 2 remaining wounds 1 on the left lateral leg and one on the right medial leg. Both of filled in nicely since we are  able to increase her from a 3 layer to a 4 layer compression. I am also following up on the lymph node notable on her left leg DVT rule out in November 04/10/16; area on the right lateral leg is healed. The area on the left leg is still open but looks considerably better with healthy granulation and less wound area. I12/20/17; last week we healed the patient doubt with regards to the wound on the right leg to her on compression stocking 20-30 mmHg as it turns out I don't think she had a stocking, predictably therefore she has reopened. The left leg as well as the wound on the lateral aspect. Been generally small line 04/24/16; we healed out her right leg 2 weeks ago to her own 20-30 mm compression stockings although as it turns out she didn't have these and did not purchase some apparently because of financial issues. The left leg wound is on the lateral aspect. Both of these wounds are just about healed. 05/01/16; her wounds on the left leg are healed out today. The area on the right leg is also healed. Vitamin diligent effort of our staff we have not been able to get any form of compression stocking to this patient. The orders for juxtalite's were given to home health this never materialized. We Shakespeare, Jeania H. ([id number]) ordered compression stockings I don't think she was able to afford these. We had a discussion today with both the patient and her husband without these, will accumulate edema and the wounds will simply reopen again. 05/14/16 READMISSION this is a patient we healed out 2 weeks ago. As bilateral lower extremity venous wounds probably some degree of lymphedema. At the beginning in November I did a duplex ultrasound of her left leg that was negative for DVT but did show a lymph node in the left inguinal area presumably reactive. We've been using compression to her lower extremities eventually healed all her wounds on her bilateral calves but we were not able to get any form of  compression stockings for her in spite of intense efforts of our staff. She returns today with reopening on the left leg did not the right she has several superficial areas anteriorly but an actual frank ulcer on the lateral left calf. This is about the size of a dime or smaller. She does not really complain of pain. The patient has a history of PAD however arterial studies we did in November were normal. Her ABIs and Doppler waveforms were both within the normal limits. The patient has a history of MS. In discussing things with her today it would appear that the opening was noted 2 weeks ago according to the patient. We gave her Tubigrip stockings when she left the clinic 05/21/16; she has not yet had the duplex ultrasound of her thigh, this is booked for Thursday. The wound on the left lateral lower leg is improved 05/28/16; her duplex ultrasound of the left leg specifically her left thigh did not show a DVT however did  show hypoechogenic enlarged left inguinal lymph nodes up to 6.9 x 3.7 x 6.8. This also showed in the one in November however maximal diameter was 6.2, at that point I thought these were reactive secondary to cellulitis however there is obviously a differential here. Her wound on the left lower leg is closed. She has a superficial open area on the base of her left fifth metatarsal head on the plantar foot. It looks as though she has some wrap injuries on the anterior leg 06/05/16; since she was last year she had a fall on 1/31. She was seen in the ER but sent back home. I think she is also been seen by her primary physician who picked up on the swelling and the lymphadenopathy. She is ordered a CT scan of the abdomen and pelvis however this will be without contrast because of stage IIIB renal insufficiency. Nevertheless this should be a start the workup to make sure there is not is more systemic problem here. She will probably need consideration of a biopsy of the lymph node in her inguinal  area, this would need general surgery. As far as her wounds are concern today the area on the left plantar foot is healed. She has a small weeping area on the left lower leg. The wrap injury from last week has largely Pardoxically there is a lot less edema in the left thigh. 06/12/16; CT scan is on Friday. She has 2 small open areas one on the lateral left lower leg and one on the anterior lower leg on the left. Both of these looks healthy. I reduced her compression last week to Kerlix and Coban this seems to have not a reasonable job 07/02/16; the patient is now in the skilled facility. I think this was arranged out of the ER when she fell at home although I'm not exactly sure. She is going for I think a laparotomy for the pelvic mass next week. All the wounds in the left leg have healed Readmission: 07/20/2020 upon evaluation today patient presents for reevaluation here in clinic unfortunately she is having issues with a significant stage III pressure ulcer to her left heel. This is something that occurred when she was in the hospital sick. And subsequently though she does not appear to have any new injury she is still trying to recover from the pressure injury which was quite severe during that time. I do not see any signs of infection right now which is great news. With that being said she has been require some debridement to try to clear this up as much as possible. We need to get the wound VAC cleared so that she can see overall healing and improvement that were looking for. The patient does have a history of hypertension, chronic kidney disease stage III, COPD, and multiple sclerosis. Currently she has been trying to do what she can to keep pressure off of her heel while in bed and in other locations as well. 08/04/20 upon evaluation today patient appears to be doing extremely well currently in regard to her wound on the heel. I do believe the Iodoflex has helped loosen things appear. Unfortunately  she tells me that the home health nurse has not been putting the Iodoflex on the wound. That is news to [place] and that they tell me they got supplies in the mail but they did not see the Iodoflex being used. Nonetheless I am wondering if this is just something that they missed or if it truly has  not been. I did tell them to look when they get home in their box of supplies to see if they saw the dressing which showed and what it should look like. 08/11/2020 upon evaluation today patient appears to be doing decently well in regard to her wound although unfortunately this is showing signs of some blue-green drainage consistent with Pseudomonas it does have a little worried in this regard. Fortunately there is no evidence of active infection which is great news. No fevers, chills, nausea, vomiting, or diarrhea. 08/18/2020 upon evaluation today patient appears to be doing well with regard to her wound. She has been tolerating the dressing changes without complication. Fortunately there is no signs of active infection at this time. No fevers, chills, nausea, vomiting, or diarrhea. 4/29; left heel wound pressure ulcer. Slightly smaller. 09/04/2020 upon evaluation today patient appears to be doing well with regard to her wound. This is actually on her heel and seems to be doing well other than the fact that is a little bit moist from excessive drainage. There does not appear to be any signs of active infection at this time. No fevers, chills, nausea, vomiting, or diarrhea.. 09/11/2020 upon evaluation today patient appears to be doing okay in regard to her wound. With that being said she still has quite a bit of drainage noted at this point. Fortunately there is no signs of active infection which is great news overall I am extremely pleased with where things stand today. I do think we need to try to do something a little bit more to help control the drainage a bit better. 5/23; left posterior heel. This actually  looks quite good. Using Hydrofera Blue under compression. Dimensions are smaller 09/26/2020 upon evaluation today patient appears to be doing well with regard to her heel ulcer. In fact this is showing signs of excellent improvement and overall I am extremely pleased with where things stand today. I do not see any evidence of infection which is great news and overall I feel like she is headed in the correct direction. 10/09/2020 upon evaluation today patient appears to be doing excellent in regard to her wound. She has been tolerating the dressing changes without complication. Fortunately there is no evidence of active infection which is also excellent news. No fevers, chills, nausea, vomiting, or diarrhea. 10/16/2020 upon evaluation today patient appears to be doing well with regard to her heel ulcer. Unfortunately she is having issues with an area of deep tissue injury/pressure injury over the shin due to the wrap being on way too tight today. Fortunately there does not appear to be any signs of infection currently. No fevers, chills, nausea, vomiting, or diarrhea. EVANA, RUNNELS ([id number]) Objective Constitutional Well-nourished and well-hydrated in no acute distress. Vitals Time Taken: 2:55 PM, Height: 64 in, Weight: 265 lbs, BMI: 45.5, Temperature: 97.9 F, Pulse: 79 bpm, Respiratory Rate: 20 breaths/min, Blood Pressure: 93/54 mmHg. Respiratory normal breathing without difficulty. Psychiatric this patient is able to make decisions and demonstrates good insight into disease process. Alert and Oriented x 3. pleasant and cooperative. General Notes: Patient still actually appears to be doing quite well which I am very pleased about. There does not appear to be any signs of active infection at this time which is great news. No fevers, chills, nausea, vomiting, or diarrhea. Unfortunately the deep tissue injury over the anterior portion of the shin is what I am most concerned about at this point  to be honest. The good news is the patient  does not seem to be having any signs of infection or really open wound yet although I am concerned that this is going to develop into an open wound to be honest. Integumentary (Hair, Skin) Wound #12 status is Open. Original cause of wound was Pressure Injury. The date acquired was: 06/27/2020. The wound has been in treatment 12 weeks. The wound is located on the Left Calcaneus. The wound measures 2cm length x 1.2cm width x 0.2cm depth; 1.885cm^2 area and 0.377cm^3 volume. There is Fat Layer (Subcutaneous Tissue) exposed. There is no tunneling or undermining noted. There is a medium amount of serous drainage noted. The wound margin is thickened. There is medium (34-66%) red, pink, pale granulation within the wound bed. There is a small (1-33%) amount of necrotic tissue within the wound bed including Adherent Slough. Other Condition(s) Patient presents with Suspected Deep Tissue Injury located on the Left Leg. Assessment Active Problems ICD-10 Pressure ulcer of left heel, stage 3 Essential (primary) hypertension Chronic kidney disease, stage 3 unspecified Chronic obstructive pulmonary disease, unspecified Multiple sclerosis Plan Follow-up Appointments: Return Appointment in 1 week. Home Health: [facility] for wound care. May utilize formulary equivalent dressing for wound treatment orders unless otherwise specified. Home Health Nurse may visit PRN to address patient s wound care needs. Scheduled days for dressing changes to be completed; exception, patient has scheduled wound care visit that day. - Mondays and Thursdays **Please direct any NON-WOUND related issues/requests for orders to patient's Primary Care Physician. **If current dressing causes regression in wound condition, may D/C ordered dressing product/s and apply Normal Saline Moist Dressing daily until next [place] or Other  MD appointment. **Notify Wound Healing Center of regression in wound condition at [phone]. Edema Control - Lymphedema / Segmental Compressive Device / Other: Elevate, Exercise Daily and Avoid Standing for Long Periods of Time. Elevate legs to the level of the heart and pump ankles as often as possible - ELEVATE LEGS Elevate leg(s) parallel to the floor when sitting. Off-Loading: Open toe surgical shoe Turn and reposition every 2 hours Other: - Keep pressure off left heel WOUND #12: - Calcaneus Wound Laterality: Left Cleanser: Soap and Water 2 x Per Week/30 Days Discharge Instructions: Gently cleanse wound with antibacterial soap, rinse and pat dry prior to dressing wounds Peri-Wound Care: Desitin Maximum Strength Ointment 4 (oz) 2 x Per Week/30 Days Don, Yessica H. ([id number]) Discharge Instructions: Apply around perimeter of wound for protection Primary Dressing: Hydrofera Blue Ready Transfer Foam, 4x5 (in/in) (Generic) 2 x Per Week/30 Days Discharge Instructions: Apply Hydrofera Blue Ready to wound bed as directed Secondary Dressing: ABD Pad 5x9 (in/in) 2 x Per Week/30 Days Discharge Instructions: Line ABD pads on shin area Secondary Dressing: Xtrasorb Medium 4x5 (in/in) 2 x Per Week/30 Days Discharge Instructions: Apply to wound as directed. Do not cut. Secured With: 57M Medipore H Soft Cloth Surgical Tape, 2x2 (in/yd) 2 x Per Week/30 Days Discharge Instructions: Secure kerlix Secured With: [employer] Sterile or Non-Sterile 6-ply 4.5x4 (yd/yd) 2 x Per Week/30 Days Discharge Instructions: Apply Kerlix from base of toes to 3-finger-widths below knee Secured With: Tubigrip Size D, 3x10 (in/yd) 2 x Per Week/30 Days Discharge Instructions: Apply 3 Tubigrip D 3-finger-widths below knee to base of toes to secure dressing and/or for swelling. (extra one sent with patient) 1. Would recommend currently that we going continue with the wound care measures as before in regard to the heel.  This includes the use  of the [facility] Blue followed by [employer]. 2. I am also can recommend that we have the patient continue with compression although working to use a lesser Tubigrip I do not think I want to do anything with an actual compression wrap at this point I want to give her she had a chance to heal better. 3. I am also can recommend the patient continue to elevate her legs much as possible to try to help with edema control. We will see patient back for reevaluation in 1 week here in the clinic. If anything worsens or changes patient will contact our office for additional recommendations. Electronic Signature(s) Signed: 10/16/2020 5:34:05 PM By: Worthy Keeler PA-C Entered By: Worthy Keeler on 10/16/2020 17:34:05 [name] ([id number]) -------------------------------------------------------------------------------- SuperBill Details Patient Name: [name] Date of Service: 10/16/2020 Medical Record Number: [id number] Patient Account Number: [id number] Date of Birth/Sex: Feb 13, 1951 (69 y.o. F) Treating RN: [name] Primary Care Provider: [name] Other Clinician: Referring Provider: [name] Treating Provider/Extender: [name] in Treatment: 12 Diagnosis Coding ICD-10 Codes Code Description [phone] Pressure ulcer of left heel, stage 3 I10 Essential (primary) hypertension N18.30 Chronic kidney disease, stage 3 unspecified J44.9 Chronic obstructive pulmonary disease, unspecified G35 Multiple sclerosis Facility Procedures CPT4 Code: [id number] Description: 99213 - WOUND CARE VISIT-LEV 3 EST PT Modifier: Quantity: 1 Physician Procedures CPT4 Code: [id number] Description: 99213 - WC PHYS LEVEL 3 - EST PT Modifier: Quantity: 1 CPT4 Code: Description: ICD-10 Diagnosis Description L89.623 Pressure ulcer of left heel, stage 3 I10 Essential (primary) hypertension N18.30 Chronic kidney disease, stage 3 unspecified J44.9 Chronic  obstructive pulmonary disease, unspecified Modifier: Quantity: Electronic Signature(s) Signed: 10/16/2020 5:35:44 PM By: Worthy Keeler PA-C Previous Signature: 10/16/2020 4:29:35 PM Version By: Georges Mouse, Minus Breeding RN Entered By: Worthy Keeler on 10/16/2020 17:35:43

## 2020-10-18 NOTE — Progress Notes (Signed)
SUHEY, RADFORD ([id number]) Visit Report for 10/16/2020 Arrival Information Details Patient Name: MIAMI, LATULIPPE Date of Service: 10/16/2020 2:30 PM Medical Record Number: [id number] Patient Account Number: [id number] Date of Birth/Sex: 05-03-1950 (69 y.o. F) Treating RN: Donnamarie Poag Primary Care Jameal Razzano: [name] Other Clinician: Referring Maxamilian Amadon: [name] Treating Hitesh Fouche/Extender: [name] in Treatment: 12 Visit Information History Since Last Visit Added or deleted any medications: No Patient Arrived: Wheel Chair Had a fall or experienced change in No Arrival Time: 14:53 activities of daily living that may affect Accompanied By: husband risk of falls: Transfer Assistance: None Hospitalized since last visit: No Patient Identification Verified: Yes Pain Present Now: No Secondary Verification Process Completed: Yes Patient Requires Transmission-Based Precautions: No Patient Has Alerts: Yes Electronic Signature(s) Signed: 10/17/2020 4:23:36 PM By: [name] Entered By: [name] on 10/16/2020 14:54:24 Carte, [name] ([id number]) -------------------------------------------------------------------------------- Clinic Level of Care Assessment Details Patient Name: [name] Date of Service: 10/16/2020 2:30 PM Medical Record Number: [id number] Patient Account Number: [id number] Date of Birth/Sex: Mar 22, 1951 (69 y.o. F) Treating RN: [name] Primary Care Charita Lindenberger: [name] Other Clinician: Referring Stavros Cail: [name] Treating Kiarrah Rausch/Extender: [name] in Treatment: 12 Clinic Level of Care Assessment Items TOOL 4 Quantity Score X - Use when only an EandM is performed on FOLLOW-UP visit 1 0 ASSESSMENTS - Nursing Assessment / Reassessment X - Reassessment of Co-morbidities (includes updates in patient status) 1 10 X- 1 5 Reassessment of Adherence to Treatment Plan ASSESSMENTS - Wound and Skin  Assessment / Reassessment X - Simple Wound Assessment / Reassessment - one wound 1 5 _0  - 0 Complex Wound Assessment / Reassessment - multiple wounds X- 1 10 Dermatologic / Skin Assessment (not related to wound area) ASSESSMENTS - Focused Assessment _1  - Circumferential Edema Measurements - multi extremities 0 _2  - 0 Nutritional Assessment / Counseling / Intervention _3  - 0 Lower Extremity Assessment (monofilament, tuning fork, pulses) _4  - 0 Peripheral Arterial Disease Assessment (using hand held doppler) ASSESSMENTS - Ostomy and/or Continence Assessment and Care _5  - Incontinence Assessment and Management 0 _6  - 0 Ostomy Care Assessment and Management (repouching, etc.) PROCESS - Coordination of Care X - Simple Patient / Family Education for ongoing care 1 15 _7  - 0 Complex (extensive) Patient / Family Education for ongoing care _8  - 0 Staff obtains Programmer, systems, Records, Test Results / Process Orders _9  - 0 Staff telephones HHA, Nursing Homes / Clarify orders / etc _10  - 0 Routine Transfer to another Facility (non-emergent condition) _11  - 0 Routine Hospital Admission (non-emergent condition) _12  - 0 New Admissions / Biomedical engineer / Ordering NPWT, Apligraf, etc. _13  - 0 Emergency Hospital Admission (emergent condition) X- 1 10 Simple Discharge Coordination _14  - 0 Complex (extensive) Discharge Coordination PROCESS - Special Needs _15  - Pediatric / Minor Patient Management 0 _16  - 0 Isolation Patient Management _17  - 0 Hearing / Language / Visual special needs _18  - 0 Assessment of Community assistance (transportation, D/C planning, etc.) _19  - 0 Additional assistance / Altered mentation _20  - 0 Support Surface(s) Assessment (bed, cushion, seat, etc.) INTERVENTIONS - Wound Cleansing / Measurement Haft, Veroncia H. ([id number]) X- 1 5 Simple Wound Cleansing - one wound _21  - 0 Complex Wound Cleansing - multiple wounds X- 1 5 Wound Imaging (photographs - any number of  wounds) _22  - 0 Wound Tracing (instead of photographs) X- 1 5 Simple Wound Measurement - one wound _23  - 0 Complex Wound Measurement - multiple wounds INTERVENTIONS -  Wound Dressings _0  - Small Wound Dressing one or multiple wounds 0 X- 1 15 Medium Wound Dressing one or multiple wounds _1  - 0 Large Wound Dressing one or multiple wounds <ZOXWRUEAVWUJWJXB>_1<\/YNWGNFAOZHYQMVHQ>_4  - 0 Application of Medications - topical <ONGEXBMWUXLKGMWN>_0<\/UVOZDGUYQIHKVQQV>_9  - 0 Application of Medications - injection INTERVENTIONS - Miscellaneous _4  - External ear exam 0 _5  - 0 Specimen Collection (cultures, biopsies, blood, body fluids, etc.) _6  - 0 Specimen(s) / Culture(s) sent or taken to Lab for analysis _7  - 0 Patient Transfer (multiple staff / Harrel Lemon Lift / Similar devices) _8  - 0 Simple Staple / Suture removal (25 or less) _9  - 0 Complex Staple / Suture removal (26 or more) _10  - 0 Hypo / Hyperglycemic Management (close monitor of Blood Glucose) _11  - 0 Ankle / Brachial Index (ABI) - do not check if billed separately X- 1 5 Vital Signs Has the patient been seen at the hospital within the last three years: Yes Total Score: 90 Level Of Care: New/Established - Level 3 Electronic Signature(s) Signed: 10/16/2020 4:29:35 PM By: Georges Mouse, Minus Breeding RN Entered By: Georges Mouse, Minus Breeding on 10/16/2020 15:50:58 [name] ([id number]) -------------------------------------------------------------------------------- Encounter Discharge Information Details Patient Name: [name] Date of Service: 10/16/2020 2:30 PM Medical Record Number: [id number] Patient Account Number: [id number] Date of Birth/Sex: 12/06/1950 (69 y.o. F) Treating RN: Carlene Coria Primary Care Arliss Frisina: [name] Other Clinician: Referring Ramesha Poster: [name] Treating Kvon Mcilhenny/Extender: [name] in Treatment: 12 Encounter Discharge Information Items Discharge Condition: Stable Ambulatory Status: Wheelchair Discharge Destination: Home Transportation: Private  Auto Accompanied By: self Schedule Follow-up Appointment: Yes Clinical Summary of Care: Patient Declined Electronic Signature(s) Signed: 10/18/2020 4:27:57 PM By: Carlene Coria RN Entered By: Carlene Coria on 10/16/2020 16:06:16 [name] ([id number]) -------------------------------------------------------------------------------- Lower Extremity Assessment Details Patient Name: [name] Date of Service: 10/16/2020 2:30 PM Medical Record Number: [id number] Patient Account Number: [id number] Date of Birth/Sex: 1950/05/05 (69 y.o. F) Treating RN: Donnamarie Poag Primary Care Shai Rasmussen: [name] Other Clinician: Referring Sanaiyah Kirchhoff: [name] Treating Marlie Kuennen/Extender: [name] in Treatment: 12 Edema Assessment Assessed: [Left: Yes] [Right: No] Edema: [Left: Ye] [Right: s] Calf Left: Right: Point of Measurement: 36 cm From Medial Instep 36.3 cm Ankle Left: Right: Point of Measurement: 10 cm From Medial Instep 19.3 cm Vascular Assessment Pulses: Dorsalis Pedis Palpable: [Left:Yes] Electronic Signature(s) Signed: 10/16/2020 4:26:29 PM By: Donnamarie Poag Signed: 10/17/2020 4:23:36 PM By: [name] Entered By: [name] on 10/16/2020 15:17:18 Weller, [name] ([id number]) -------------------------------------------------------------------------------- Multi Wound Chart Details Patient Name: [name] Date of Service: 10/16/2020 2:30 PM Medical Record Number: [id number] Patient Account Number: [id number] Date of Birth/Sex: 06-Dec-1950 (69 y.o. F) Treating RN: [name] Primary Care Jad Johansson: [name] Other Clinician: Referring Aleta Manternach: [name] Treating Talyssa Gibas/Extender: [name] in Treatment: 12 Vital Signs Height(in): 63 Pulse(bpm): 42 Weight(lbs): 265 Blood Pressure(mmHg): 49/54 Body Mass Index(BMI): 45 Temperature(F): 97.9 Respiratory Rate(breaths/min): 20 Photos: [N/A:N/A] Wound Location:  Left Calcaneus N/A N/A Wounding Event: Pressure Injury N/A N/A Primary Etiology: Pressure Ulcer N/A N/A Comorbid History: Cataracts, Chronic Obstructive N/A N/A Pulmonary Disease (COPD), Sleep Apnea, Hypertension, Osteoarthritis, Neuropathy Date Acquired: 06/27/2020 N/A N/A Weeks of Treatment: 12 N/A N/A Wound Status: Open N/A N/A Measurements L x W x D (cm) 2x1.2x0.2 N/A N/A Area (cm) : 1.885 N/A N/A Volume (cm) : 0.377 N/A N/A % Reduction in Area: 63.10% N/A N/A % Reduction in Volume: 63.10% N/A N/A Classification: Category/Stage III N/A N/A Exudate Amount: Medium N/A N/A Exudate Type:  Serous N/A N/A Exudate Color: amber N/A N/A Wound Margin: Thickened N/A N/A Granulation Amount: Medium (34-66%) N/A N/A Granulation Quality: Red, Pink, Pale N/A N/A Necrotic Amount: Small (1-33%) N/A N/A Exposed Structures: Fat Layer (Subcutaneous Tissue): N/A N/A Yes Fascia: No Tendon: No Muscle: No Joint: No Bone: No Epithelialization: None N/A N/A Treatment Notes Electronic Signature(s) Signed: 10/16/2020 4:29:35 PM By: Georges Mouse, Minus Breeding RN Entered By: Georges Mouse, Minus Breeding on 10/16/2020 15:46:25 [name] ([id number]) -------------------------------------------------------------------------------- Multi-Disciplinary Care Plan Details Patient Name: [name] Date of Service: 10/16/2020 2:30 PM Medical Record Number: [id number] Patient Account Number: [id number] Date of Birth/Sex: [birth year] (69 y.o. F) Treating RN: [name] Primary Care Victoriano Campion: [name] Other Clinician: Referring Kharee Lesesne: [name] Treating Jacia Sickman/Extender: [name] in Treatment: 12 Active Inactive Wound/Skin Impairment Nursing Diagnoses: Impaired tissue integrity Goals: Patient/caregiver will verbalize understanding of skin care regimen Date Initiated: 07/20/2020 Date Inactivated: 09/04/2020 Target Resolution Date: 09/19/2020 Goal Status: Met Ulcer/skin  breakdown will have a volume reduction of 30% by week 4 Date Initiated: 07/20/2020 Date Inactivated: 08/25/2020 Target Resolution Date: 08/20/2020 Goal Status: Met Ulcer/skin breakdown will have a volume reduction of 50% by week 8 Date Initiated: 07/20/2020 Date Inactivated: 10/09/2020 Target Resolution Date: 09/19/2020 Goal Status: Met Ulcer/skin breakdown will have a volume reduction of 80% by week 12 Date Initiated: 07/20/2020 Target Resolution Date: 10/20/2020 Goal Status: Active Ulcer/skin breakdown will heal within 14 weeks Date Initiated: 07/20/2020 Target Resolution Date: 11/19/2020 Goal Status: Active Interventions: Assess patient/caregiver ability to obtain necessary supplies Assess patient/caregiver ability to perform ulcer/skin care regimen upon admission and as needed Provide education on ulcer and skin care Treatment Activities: Referred to DME Stanislaus Kaltenbach for dressing supplies : 07/20/2020 Skin care regimen initiated : 07/20/2020 Notes: Electronic Signature(s) Signed: 10/16/2020 4:29:35 PM By: Georges Mouse, Minus Breeding RN Entered By: Georges Mouse, Minus Breeding on 10/16/2020 15:46:19 [name] ([id number]) -------------------------------------------------------------------------------- Non-Wound Condition Assessment Details Patient Name: [name] Date of Service: 10/16/2020 2:30 PM Medical Record Number: [id number] Patient Account Number: [id number] Date of Birth/Sex: [birth year] (69 y.o. F) Treating RN: Donnamarie Poag Primary Care Cyris Maalouf: [name] Other Clinician: Referring Chassity Ludke: [name] Treating Ruthie Berch/Extender: [name] Weeks in Treatment: 12 Non-Wound Condition: Condition: Suspected Deep Tissue Injury Location: Leg Side: Left Photos Electronic Signature(s) Signed: 10/16/2020 3:41:39 PM By: [name] Signed: 10/16/2020 4:26:29 PM By: Donnamarie Poag Entered By: [name] on 10/16/2020 15:41:39 [name]  ([id number]) -------------------------------------------------------------------------------- Pain Assessment Details Patient Name: [name] Date of Service: 10/16/2020 2:30 PM Medical Record Number: [id number] Patient Account Number: [id number] Date of Birth/Sex: 1951-04-15 (69 y.o. F) Treating RN: Donnamarie Poag Primary Care Calyb Mcquarrie: [name] Other Clinician: Referring Jordin Vicencio: [name] Treating Dafne Nield/Extender: [name] in Treatment: 12 Active Problems Location of Pain Severity and Description of Pain Patient Has Paino No Site Locations Rate the pain. Current Pain Level: 0 Pain Management and Medication Current Pain Management: Electronic Signature(s) Signed: 10/16/2020 4:26:29 PM By: Donnamarie Poag Signed: 10/17/2020 4:23:36 PM By: [name] Entered By: [name] on 10/16/2020 15:08:57 [name] ([id number]) -------------------------------------------------------------------------------- Patient/Caregiver Education Details Patient Name: [name] Date of Service: 10/16/2020 2:30 PM Medical Record Number: [id number] Patient Account Number: [id number] Date of Birth/Gender: [birth year] (69 y.o. F) Treating RN: [name] Primary Care Physician: [name] Other Clinician: Referring Physician: [name] Treating Physician/Extender: [name] in Treatment: 12 Education Assessment Education Provided To: Patient Education Topics Provided Wound/Skin Impairment: Methods: Explain/Verbal Responses: State content correctly [employer])  Signed: 10/16/2020 4:29:35 PM By: Georges Mouse, Minus Breeding RN Entered By: Georges Mouse, Minus Breeding on 10/16/2020 15:51:13 [name] ([id number]) -------------------------------------------------------------------------------- Wound Assessment Details Patient Name: [name] Date of Service: 10/16/2020 2:30 PM Medical Record Number: [id number] Patient  Account Number: [id number] Date of Birth/Sex: 29-Nov-1950 (69 y.o. F) Treating RN: Donnamarie Poag Primary Care Itai Barbian: [name] Other Clinician: Referring Mordechai Matuszak: [name] Treating Ziyah Cordoba/Extender: [name] in Treatment: 12 Wound Status Wound Number: 12 Primary Pressure Ulcer Etiology: Wound Location: Left Calcaneus Wound Open Wounding Event: Pressure Injury Status: Date Acquired: 06/27/2020 Comorbid Cataracts, Chronic Obstructive Pulmonary Disease Weeks Of Treatment: 12 History: (COPD), Sleep Apnea, Hypertension, Osteoarthritis, Clustered Wound: No Neuropathy Photos Wound Measurements Length: (cm) 2 Width: (cm) 1.2 Depth: (cm) 0.2 Area: (cm) 1.885 Volume: (cm) 0.377 % Reduction in Area: 63.1% % Reduction in Volume: 63.1% Epithelialization: None Tunneling: No Undermining: No Wound Description Classification: Category/Stage III Wound Margin: Thickened Exudate Amount: Medium Exudate Type: Serous Exudate Color: amber Foul Odor After Cleansing: No Slough/Fibrino Yes Wound Bed Granulation Amount: Medium (34-66%) Exposed Structure Granulation Quality: Red, Pink, Pale Fascia Exposed: No Necrotic Amount: Small (1-33%) Fat Layer (Subcutaneous Tissue) Exposed: Yes Necrotic Quality: Adherent Slough Tendon Exposed: No Muscle Exposed: No Joint Exposed: No Bone Exposed: No Treatment Notes Wound #12 (Calcaneus) Wound Laterality: Left Cleanser Soap and Water Discharge Instruction: Gently cleanse wound with antibacterial soap, rinse and pat dry prior to dressing wounds Tutton, Tannya H. ([id number]) Peri-Wound Care Desitin Maximum Strength Ointment 4 (oz) Discharge Instruction: Apply around perimeter of wound for protection Topical Primary Dressing Hydrofera Blue Ready Transfer Foam, 4x5 (in/in) Discharge Instruction: Apply Hydrofera Blue Ready to wound bed as directed Secondary Dressing ABD Pad 5x9 (in/in) Discharge Instruction: Line ABD pads  on shin area Xtrasorb Medium 4x5 (in/in) Discharge Instruction: Apply to wound as directed. Do not cut. Secured With 66M Medipore H Soft Cloth Surgical Tape, 2x2 (in/yd) Discharge Instruction: Secure kerlix Kerlix Roll Sterile or Non-Sterile 6-ply 4.5x4 (yd/yd) Discharge Instruction: Apply Kerlix from base of toes to 3-finger-widths below knee Tubigrip Size D, 3x10 (in/yd) Discharge Instruction: Apply 3 Tubigrip D 3-finger-widths below knee to base of toes to secure dressing and/or for swelling. (extra one sent with patient) Compression Wrap Compression Stockings Add-Ons Electronic Signature(s) Signed: 10/16/2020 4:26:29 PM By: Donnamarie Poag Signed: 10/17/2020 4:23:36 PM By: [name] Entered By: [name] on 10/16/2020 15:15:20 [name] ([id number]) -------------------------------------------------------------------------------- Vitals Details Patient Name: [name] Date of Service: 10/16/2020 2:30 PM Medical Record Number: [id number] Patient Account Number: [id number] Date of Birth/Sex: 12-16-1950 (69 y.o. F) Treating RN: Donnamarie Poag Primary Care Grace Haggart: [name] Other Clinician: Referring Aisley Whan: [name] Treating Thayne Cindric/Extender: [name] in Treatment: 12 Vital Signs Time Taken: 14:55 Temperature (F): 97.9 Height (in): 64 Pulse (bpm): 79 Weight (lbs): 265 Respiratory Rate (breaths/min): 20 Body Mass Index (BMI): 45.5 Blood Pressure (mmHg): 93/54 Reference Range: 80 - 120 mg / dl Electronic Signature(s) Signed: 10/17/2020 4:23:36 PM By: [name] Entered By: [name] on 10/16/2020 15:00:44

## 2020-10-23 ENCOUNTER — Encounter: Payer: Medicare Other | Admitting: Physician Assistant

## 2020-10-23 ENCOUNTER — Other Ambulatory Visit: Payer: Self-pay

## 2020-10-23 DIAGNOSIS — L89623 Pressure ulcer of left heel, stage 3: Secondary | ICD-10-CM | POA: Diagnosis not present

## 2020-10-23 NOTE — Progress Notes (Addendum)
[name], [name] ([id number]) Visit Report for 10/23/2020 Arrival Information Details Patient Name: [name], [name] Date of Service: 10/23/2020 3:00 PM Medical Record Number: [id number] Patient Account Number: [id number] Date of Birth/Sex: Oct 04, 1950 (70 y.o. F) Treating RN: [name] Primary Care [name]: [name] Other Clinician: Referring [name]: [name] Treating [name]/Extender: [name] in Treatment: 13 Visit Information History Since Last Visit All ordered tests and consults were completed: No Patient Arrived: Wheel Chair Added or deleted any medications: No Arrival Time: 15:18 Any new allergies or adverse reactions: No Accompanied By: husband Had a fall or experienced change in No Transfer Assistance: None activities of daily living that may affect Patient Identification Verified: Yes risk of falls: Secondary Verification Process Completed: Yes Signs or symptoms of abuse/neglect since last visito No Patient Requires Transmission-Based Precautions: No Hospitalized since last visit: No Patient Has Alerts: Yes Implantable device outside of the clinic excluding No cellular tissue based products placed in the center since last visit: Has Dressing in Place as Prescribed: Yes Pain Present Now: No Electronic Signature(s) Signed: 10/24/2020 8:03:47 AM By: [name] Coria RN Entered By: [name] on 10/23/2020 15:19:01 [name], [name] ([id number]) -------------------------------------------------------------------------------- Clinic Level of Care Assessment Details Patient Name: [name] Date of Service: 10/23/2020 3:00 PM Medical Record Number: [id number] Patient Account Number: [id number] Date of Birth/Sex: 1950-07-24 (70 y.o. F) Treating RN: [name] Primary Care Kashmir Leedy: [name] Other Clinician: Referring Naysha Sholl: [name] Treating Yamaira Spinner/Extender: [name] in Treatment: 13 Clinic Level of Care  Assessment Items TOOL 4 Quantity Score _0  - Use when only an EandM is performed on FOLLOW-UP visit 0 ASSESSMENTS - Nursing Assessment / Reassessment _1  - Reassessment of Co-morbidities (includes updates in patient status) 0 _2  - 0 Reassessment of Adherence to Treatment Plan ASSESSMENTS - Wound and Skin Assessment / Reassessment X - Simple Wound Assessment / Reassessment - one wound 1 5 _3  - 0 Complex Wound Assessment / Reassessment - multiple wounds _4  - 0 Dermatologic / Skin Assessment (not related to wound area) ASSESSMENTS - Focused Assessment _5  - Circumferential Edema Measurements - multi extremities 0 _6  - 0 Nutritional Assessment / Counseling / Intervention _7  - 0 Lower Extremity Assessment (monofilament, tuning fork, pulses) _8  - 0 Peripheral Arterial Disease Assessment (using hand held doppler) ASSESSMENTS - Ostomy and/or Continence Assessment and Care _9  - Incontinence Assessment and Management 0 _10  - 0 Ostomy Care Assessment and Management (repouching, etc.) PROCESS - Coordination of Care X - Simple Patient / Family Education for ongoing care 1 15 _11  - 0 Complex (extensive) Patient / Family Education for ongoing care _12  - 0 Staff obtains Programmer, systems, Records, Test Results / Process Orders X- 1 10 Staff telephones HHA, Nursing Homes / Clarify orders / etc _13  - 0 Routine Transfer to another Facility (non-emergent condition) _14  - 0 Routine Hospital Admission (non-emergent condition) _15  - 0 New Admissions / Biomedical engineer / Ordering NPWT, Apligraf, etc. _16  - 0 Emergency Hospital Admission (emergent condition) X- 1 10 Simple Discharge Coordination _17  - 0 Complex (extensive) Discharge Coordination PROCESS - Special Needs _18  - Pediatric / Minor Patient Management 0 _19  - 0 Isolation Patient Management _20  - 0 Hearing / Language / Visual special needs _21  - 0 Assessment of Community assistance (transportation, D/C planning, etc.) _22  - 0 Additional  assistance / Altered mentation _23  - 0 Support Surface(s) Assessment (bed, cushion, seat, etc.) INTERVENTIONS - Wound Cleansing / Measurement [name], [name] H. ([id number]) X- 1 5 Simple Wound Cleansing - one  wound _0  - 0 Complex Wound Cleansing - multiple wounds _1  - 0 Wound Imaging (photographs - any number of wounds) _2  - 0 Wound Tracing (instead of photographs) X- 1 5 Simple Wound Measurement - one wound _3  - 0 Complex Wound Measurement - multiple wounds INTERVENTIONS - Wound Dressings X - Small Wound Dressing one or multiple wounds 1 10 _4  - 0 Medium Wound Dressing one or multiple wounds _5  - 0 Large Wound Dressing one or multiple wounds <VOZDGUYQIHKVQQVZ>_5<\/GLOVFIEPPIRJJOAC>_1  - 0 Application of Medications - topical <YSAYTKZSWFUXNATF>_5<\/DDUKGURKYHCWCBJS>_2  - 0 Application of Medications - injection INTERVENTIONS - Miscellaneous _8  - External ear exam 0 _9  - 0 Specimen Collection (cultures, biopsies, blood, body fluids, etc.) _10  - 0 Specimen(s) / Culture(s) sent or taken to Lab for analysis _11  - 0 Patient Transfer (multiple staff / Civil Service fast streamer / Similar devices) _12  - 0 Simple Staple / Suture removal (25 or less) _13  - 0 Complex Staple / Suture removal (26 or more) _14  - 0 Hypo / Hyperglycemic Management (close monitor of Blood Glucose) _15  - 0 Ankle / Brachial Index (ABI) - do not check if billed separately X- 1 5 Vital Signs Has the patient been seen at the hospital within the last three years: Yes Total Score: 65 Level Of Care: New/Established - Level 2 Electronic Signature(s) Signed: 10/23/2020 4:19:43 PM By: [name] Entered By: [name] on 10/23/2020 16:07:09 [name] ([id number]) -------------------------------------------------------------------------------- Encounter Discharge Information Details Patient Name: [name] Date of Service: 10/23/2020 3:00 PM Medical Record Number: [id number] Patient Account Number: [id number] Date of Birth/Sex: 1951-01-04 (70 y.o. F) Treating RN: [name] Primary Care Nickalaus Crooke:  [name] Other Clinician: Referring Mistie Adney: [name] Treating Darleen Moffitt/Extender: [name] in Treatment: 13 Encounter Discharge Information Items Discharge Condition: Stable Ambulatory Status: Wheelchair Discharge Destination: Home Transportation: Private Auto Accompanied By: husband Schedule Follow-up Appointment: Yes Clinical Summary of Care: Patient Declined Electronic Signature(s) Signed: 10/24/2020 8:03:47 AM By: [name] Coria RN Entered By: [name] on 10/23/2020 16:21:34 [name] ([id number]) -------------------------------------------------------------------------------- Lower Extremity Assessment Details Patient Name: [name] Date of Service: 10/23/2020 3:00 PM Medical Record Number: [id number] Patient Account Number: [id number] Date of Birth/Sex: March 03, 1951 (70 y.o. F) Treating RN: [name] Primary Care Mardy Hoppe: [name] Other Clinician: Referring Jerol Rufener: [name] Treating Danitza Schoenfeldt/Extender: [name] in Treatment: 13 Edema Assessment Assessed: [Left: No] [Right: No] [Left: Edema] [Right: :] Calf Left: Right: Point of Measurement: 36 cm From Medial Instep 38 cm Ankle Left: Right: Point of Measurement: 10 cm From Medial Instep 22 cm Vascular Assessment Pulses: Dorsalis Pedis Palpable: [Left:Yes] Electronic Signature(s) Signed: 10/24/2020 8:03:47 AM By: [name] Coria RN Entered By: [name] on 10/23/2020 15:21:26 Leavy, [name] ([id number]) -------------------------------------------------------------------------------- Multi Wound Chart Details Patient Name: [name] Date of Service: 10/23/2020 3:00 PM Medical Record Number: [id number] Patient Account Number: [id number] Date of Birth/Sex: [birth year] (70 y.o. F) Treating RN: [name] Primary Care Marilena Trevathan: [name] Other Clinician: Referring Kaili Castille: [name] Treating Lillyian Heidt/Extender: [name] in Treatment: 13 Vital Signs Height(in): 51 Pulse(bpm): 32 Weight(lbs): 265 Blood Pressure(mmHg): 127/70 Body Mass Index(BMI): 45 Temperature(F): 98.2 Respiratory Rate(breaths/min): 18 Photos: [N/A:N/A] Wound Location: Left Calcaneus N/A N/A Wounding Event: Pressure Injury N/A N/A Primary Etiology: Pressure Ulcer N/A N/A Comorbid History: Cataracts, Chronic Obstructive N/A N/A Pulmonary Disease (COPD), Sleep Apnea, Hypertension, Osteoarthritis, Neuropathy Date Acquired: 06/27/2020 N/A N/A Weeks of Treatment: 13 N/A N/A Wound Status: Open N/A N/A Measurements L x W x D (cm) 1.7x2.5x0.2  N/A N/A Area (cm) : 3.338 N/A N/A Volume (cm) : 0.668 N/A N/A % Reduction in Area: 34.60% N/A N/A % Reduction in Volume: 34.60% N/A N/A Classification: Category/Stage III N/A N/A Exudate Amount: Medium N/A N/A Exudate Type: Serous N/A N/A Exudate Color: amber N/A N/A Wound Margin: Thickened N/A N/A Granulation Amount: Medium (34-66%) N/A N/A Granulation Quality: Red, Pink, Pale N/A N/A Necrotic Amount: Small (1-33%) N/A N/A Exposed Structures: Fat Layer (Subcutaneous Tissue): N/A N/A Yes Fascia: No Tendon: No Muscle: No Joint: No Bone: No Epithelialization: None N/A N/A Treatment Notes Electronic Signature(s) Signed: 10/23/2020 4:19:43 PM By: [name] Entered By: [name] on 10/23/2020 16:01:28 [name] ([id number]) -------------------------------------------------------------------------------- Multi-Disciplinary Care Plan Details Patient Name: [name] Date of Service: 10/23/2020 3:00 PM Medical Record Number: [id number] Patient Account Number: [id number] Date of Birth/Sex: July 22, 1950 (70 y.o. F) Treating RN: [name] Primary Care Macky Galik: [name] Other Clinician: Referring Kayelee Herbig: [name] Treating Quentez Lober/Extender: [name] in Treatment: 13 Active Inactive Wound/Skin Impairment Nursing Diagnoses: Impaired  tissue integrity Goals: Patient/caregiver will verbalize understanding of skin care regimen Date Initiated: 07/20/2020 Date Inactivated: 09/04/2020 Target Resolution Date: 09/19/2020 Goal Status: Met Ulcer/skin breakdown will have a volume reduction of 30% by week 4 Date Initiated: 07/20/2020 Date Inactivated: 08/25/2020 Target Resolution Date: 08/20/2020 Goal Status: Met Ulcer/skin breakdown will have a volume reduction of 50% by week 8 Date Initiated: 07/20/2020 Date Inactivated: 10/09/2020 Target Resolution Date: 09/19/2020 Goal Status: Met Ulcer/skin breakdown will have a volume reduction of 80% by week 12 Date Initiated: 07/20/2020 Target Resolution Date: 10/20/2020 Goal Status: Active Ulcer/skin breakdown will heal within 14 weeks Date Initiated: 07/20/2020 Target Resolution Date: 11/19/2020 Goal Status: Active Interventions: Assess patient/caregiver ability to obtain necessary supplies Assess patient/caregiver ability to perform ulcer/skin care regimen upon admission and as needed Provide education on ulcer and skin care Treatment Activities: Referred to DME [place] Markey for dressing supplies : 07/20/2020 Skin care regimen initiated : 07/20/2020 Notes: Electronic Signature(s) Signed: 10/23/2020 4:19:43 PM By: [name] Entered By: [name] on 10/23/2020 16:01:10 [name] ([id number]) -------------------------------------------------------------------------------- Pain Assessment Details Patient Name: [name] Date of Service: 10/23/2020 3:00 PM Medical Record Number: [id number] Patient Account Number: [id number] Date of Birth/Sex: 1951-04-13 (70 y.o. F) Treating RN: [name] Primary Care Jasia Hiltunen: [name] Other Clinician: Referring Jazlene Bares: [name] Treating Roshawnda Pecora/Extender: [name] in Treatment: 13 Active Problems Location of Pain Severity and Description of Pain Patient Has Paino No Site Locations Pain Management and  Medication Current Pain Management: Electronic Signature(s) Signed: 10/24/2020 8:03:47 AM By: [name] Coria RN Entered By: [name] on 10/23/2020 15:19:44 [name] ([id number]) -------------------------------------------------------------------------------- Patient/Caregiver Education Details Patient Name: [name] Date of Service: 10/23/2020 3:00 PM Medical Record Number: [id number] Patient Account Number: [id number] Date of Birth/Gender: [birth year] (70 y.o. F) Treating RN: [name] Primary Care Physician: [name] Other Clinician: Referring Physician: Frazier [name] Treating Physician/Extender: [name] in Treatment: 13 Education Assessment Education Provided To: Patient Education Topics Provided Basic Hygiene: Wound/Skin Impairment: Engineer, maintenance) Signed: 10/23/2020 4:19:43 PM By: [name] Entered By: [name] on 10/23/2020 16:01:45 [name] ([id number]) -------------------------------------------------------------------------------- Wound Assessment Details Patient Name: [name] Date of Service: 10/23/2020 3:00 PM Medical Record Number: [id number] Patient Account Number: [id number] Date of Birth/Sex: 1950/11/23 (70 y.o. F) Treating RN: [name] Primary Care Bindu Docter: [name] Other Clinician: Referring Jazelle Achey: [name] Treating Zain Lankford/Extender: [name] in Treatment: 13 Wound Status Wound Number: 51  Primary Pressure Ulcer Etiology: Wound Location: Left Calcaneus Wound Open Wounding Event: Pressure Injury Status: Date Acquired: 06/27/2020 Comorbid Cataracts, Chronic Obstructive Pulmonary Disease Weeks Of Treatment: 13 History: (COPD), Sleep Apnea, Hypertension, Osteoarthritis, Clustered Wound: No Neuropathy Photos Wound Measurements Length: (cm) 1.7 Width: (cm) 2.5 Depth: (cm) 0.2 Area: (cm) 3.338 Volume: (cm) 0.668 % Reduction in Area: 34.6% % Reduction in  Volume: 34.6% Epithelialization: None Tunneling: No Undermining: No Wound Description Classification: Category/Stage III Wound Margin: Thickened Exudate Amount: Medium Exudate Type: Serous Exudate Color: amber Foul Odor After Cleansing: No Slough/Fibrino Yes Wound Bed Granulation Amount: Medium (34-66%) Exposed Structure Granulation Quality: Red, Pink, Pale Fascia Exposed: No Necrotic Amount: Small (1-33%) Fat Layer (Subcutaneous Tissue) Exposed: Yes Necrotic Quality: Adherent Slough Tendon Exposed: No Muscle Exposed: No Joint Exposed: No Bone Exposed: No Treatment Notes Wound #12 (Calcaneus) Wound Laterality: Left Cleanser Soap and Water Discharge Instruction: Gently cleanse wound with antibacterial soap, rinse and pat dry prior to dressing wounds Eldred, Olie H. ([id number]) Peri-Wound Care Desitin Maximum Strength Ointment 4 (oz) Discharge Instruction: Apply around perimeter of wound for protection Topical Primary Dressing Hydrofera Blue Ready Transfer Foam, 4x5 (in/in) Discharge Instruction: (may cut bigger piece to ensure)DIRECTLY TO WOUND-Apply Hydrofera Blue Ready to wound bed as directed Secondary Dressing ABD Pad 5x9 (in/in) Discharge Instruction: over wound AND Line ABD pads on shin area Secured With 27M Medipore H Soft Cloth Surgical Tape, 2x2 (in/yd) Discharge Instruction: Secure kerlix Kerlix Roll Sterile or Non-Sterile 6-ply 4.5x4 (yd/yd) Discharge Instruction: Apply Kerlix from base of toes to 3-finger-widths below knee Tubigrip Size D, 3x10 (in/yd) Discharge Instruction: Apply 3 Tubigrip D 3-finger-widths below knee to base of toes to secure dressing and/or for swelling. (extra one sent with patient) Compression Wrap Compression Stockings Add-Ons Electronic Signature(s) Signed: 10/24/2020 8:03:47 AM By: [name] Coria RN Entered By: [name] on 10/23/2020 15:20:35 [name]  ([id number]) -------------------------------------------------------------------------------- Vitals Details Patient Name: [name] Date of Service: 10/23/2020 3:00 PM Medical Record Number: [id number] Patient Account Number: [id number] Date of Birth/Sex: 09/06/1950 (70 y.o. F) Treating RN: [name] Primary Care Caly Pellum: [name] Other Clinician: Referring Derian Pfost: [name] Treating Lyndsee Casa/Extender: [name] in Treatment: 13 Vital Signs Time Taken: 15:19 Temperature (F): 98.2 Height (in): 64 Pulse (bpm): 75 Weight (lbs): 265 Respiratory Rate (breaths/min): 18 Body Mass Index (BMI): 45.5 Blood Pressure (mmHg): 127/70 Reference Range: 80 - 120 mg / dl Electronic Signature(s) Signed: 10/24/2020 8:03:47 AM By: [name] Coria RN Entered By: [name] on 10/23/2020 15:19:23

## 2020-10-23 NOTE — Progress Notes (Addendum)
MERCEDE, ROLLO ([id number]) Visit Report for 10/23/2020 Chief Complaint Document Details Patient Name: [name], [name] Date of Service: 10/23/2020 3:00 PM Medical Record Number: [id number] Patient Account Number: [id number] Date of Birth/Sex: April 14, 1951 (69 y.o. F) Treating RN: Donnamarie Poag Primary Care Provider: [name] Other Clinician: Referring Provider: [name] Treating Provider/Extender: [name] in Treatment: 13 Information Obtained from: Patient Chief Complaint Left Heel Pressure Ulcer Electronic Signature(s) Signed: 10/23/2020 3:14:34 PM By: Worthy Keeler PA-C Entered By: Worthy Keeler on 10/23/2020 15:14:33 [name], [name] ([id number]) -------------------------------------------------------------------------------- HPI Details Patient Name: [name] Date of Service: 10/23/2020 3:00 PM Medical Record Number: [id number] Patient Account Number: [id number] Date of Birth/Sex: 25-Aug-1950 (69 y.o. F) Treating RN: Donnamarie Poag Primary Care Provider: [name] Other Clinician: Referring Provider: [name] Treating Provider/Extender: [name] in Treatment: 13 History of Present Illness HPI Description: 70 year old with a past medical history significant for MS, urinary incontinence, and obesity. She has been seen in the wound clinic before for lower extremity ulcerations treated with compression therapy. she is also known to have hypertension, peripheral vascular disease, COPD, obstructive sleep apnea, lumbar radiculopathy, kyphoscoliosis, urinary issues and tobacco abuse. Smokes a packet of cigarettes a day was recently seen at the [facility] for swelling of her legs and feet with a ulceration on the dorsum of the right foot which has been there for about 6 months. she was recently in the ER about a month ago where she was seen for shortness of breath and swelling of the legs and a chest x-ray was within  normal limits had an increase in her BNP and was given Lasix and put on doxycycline for a mild cellulitis and possible UTI. Wounds aresmaller today 11/13/15. Debrided and will continue Santyl. 12/21/2015 -- she was admitted to the hospital overnight on 12/18/2015 and diagnosed with urinary retention and cellulitis of the left lower leg. is past to take clindamycin and use Santyl for her wounds. 01/15/2016 -- come back to see [place] for almost a month and continues to be noncompliant with her dressings 01/30/16 patient presents today for a follow-up visit concerning her ongoing bilateral lower extremity wounds. We have not seen her for the past 2 weeks although we are supposed to be seen her for weekly visits. She currently has a Foley catheter. She tells me that the wounds are intensely painful especially with pressure and palpation at this point in time. Fortunately she is having no interval signs or symptoms of systemic infection but unfortunately the wounds have not improved dramatically since we last saw her. She does have home health coming to take care of her as well. She is currently not in any compression wraps. 02/06/16 ON evaluation today patient continues to experience discomfort regarding her bilateral lower extremity ulcers. She has continued to tolerate the dressing changes at this point in time and continues to have a Foley catheter as well. Fortunately she is back this week in the past she has been somewhat noncompliant with follow-ups I'm glad to see her today. She tells me that her pain level varies but can be as high as a 7 out of 10 with manipulation of the wound. She tells me that she used to be on oxycodone which was managed by the pain clinic although she is no longer on that as she tells me that she was actually smoking marijuana at the time and when this was found out they discontinued her pain medication. She no longer  is taking anything pain medication wise and she also does not  smoke cigarettes nor marijuana at this point. 02/12/16; this is a patient I don't believe I have previously seen. She has multiple sclerosis. She has 4 punched-out areas on the anterior lateral left leg 2 areas on the right these are all in the same condition. Reasonably small [dime size] wounds each was some depth. Surface of these wounds does not look particularly healthy as there is adherent slough. There is no evidence of surrounding infection or inflammation. ABIs in this clinic were 0.87 on the right and 0.81 on the left. She is listed as having PAD and is a smoker. Not a diabetic 02/20/16 patient I gave antibiotics to last week for erythema around both wound sites on the left lateral leg and right lateral leg. This appears to be a lot better. One of the areas on the left leg is healed however she still has 3 punched-out open areas on the left lateral calf and one on the right lateral calf and one on the dorsal foot. She is an ex-smoker quitting 1 month ago. ABIs in this clinic were 0.87 on the right 0.81 on the left 03/12/16; this is a patient I really don't have a good sense of. It would appear for the first 5 or 6 weeks of her stay in this clinic she was cared for by Dr. Con Memos. She appeared on my schedule in mid October and I saw her twice. She has not been here however in 3 weeks. She has advanced Wound Care at home where she lives with her husband. She has multiple sclerosis. I saw her the first time she had 4 punched-out small wounds on the anterior lateral left leg it appears that she is now down to 2. She also had wounds on the lateral and medial aspect of her right leg which were also small and punched out. The only one that remains is on the right lateral. Because of the nature of her wound I went ahead and ordered formal arterial studies this showed an ABI of 1 on the right and one on the left. TBI of 1.4 on the right and 0.79 on the left. She had normal waveforms. She was in the  emergency room on 02/28/16; there is a noted edema of her left leg after a fall. She had a duplex ultrasound that showed no evidence for an acute DVT from the left groin to the popliteal fossa. The study was limited in the calf veins due to edema. Also noticeable that she had a left inguinal enlarged lymph node up to 6.2 cm. She also had a left knee x-ray that showed no acute findings and a right foot x-ray that showed soft tissue swelling but no radiographic evidence of osteomyelitis. The patient does stop smoking 03-19-16 Ms Beaupre presents today for evaluation of her bilateral lower extremity ulcerations. She states that she has not smoked in several weeks. She denies any pain or discomfort to bilateral lower extremities, tolerating compression therapy as ordered. 03/26/16; I have not seen this patient in 2 weeks however she has done very well with improvements in the areas in question. After we obtain normal arterial studies, increasing the 4-layer compression really seems to look done a nice job here. She has one open area on the left lateral leg and one on the medial right leg. These have filled in nicely and are now superficial wounds that showed epithelialized. I note the left inguinal lymph node at  6.2 cm. I may need to refer this back to the patient's primary doctor. 04/03/16; patient has 2 remaining wounds 1 on the left lateral leg and one on the right medial leg. Both of filled in nicely since we are able to increase her from a 3 layer to a 4 layer compression. I am also following up on the lymph node notable on her left leg DVT rule out in November 04/10/16; area on the right lateral leg is healed. The area on the left leg is still open but looks considerably better with healthy granulation and less wound area. I12/20/17; last week we healed the patient doubt with regards to the wound on the right leg to her on compression stocking 20-30 mmHg as it turns out I don't think she had a stocking,  predictably therefore she has reopened. The left leg as well as the wound on the lateral aspect. Been generally small line 04/24/16; we healed out her right leg 2 weeks ago to her own 20-30 mm compression stockings although as it turns out she didn't have these and did not purchase some apparently because of financial issues. The left leg wound is on the lateral aspect. Both of these wounds are just about healed. 05/01/16; her wounds on the left leg are healed out today. The area on the right leg is also healed. Vitamin diligent effort of our staff we have not been able to get any form of compression stocking to this patient. The orders for juxtalite's were given to home health this never materialized. We ordered compression stockings I don't think she was able to afford these. We had a discussion today with both the patient and her husband without these, will accumulate edema and the wounds will simply reopen again. 05/14/16 READMISSION [name], [name] ([id number]) this is a patient we healed out 2 weeks ago. As bilateral lower extremity venous wounds probably some degree of lymphedema. At the beginning in November I did a duplex ultrasound of her left leg that was negative for DVT but did show a lymph node in the left inguinal area presumably reactive. We've been using compression to her lower extremities eventually healed all her wounds on her bilateral calves but we were not able to get any form of compression stockings for her in spite of intense efforts of our staff. She returns today with reopening on the left leg did not the right she has several superficial areas anteriorly but an actual frank ulcer on the lateral left calf. This is about the size of a dime or smaller. She does not really complain of pain. The patient has a history of PAD however arterial studies we did in November were normal. Her ABIs and Doppler waveforms were both within the normal limits. The patient has a history of MS.  In discussing things with her today it would appear that the opening was noted 2 weeks ago according to the patient. We gave her Tubigrip stockings when she left the clinic 05/21/16; she has not yet had the duplex ultrasound of her thigh, this is booked for Thursday. The wound on the left lateral lower leg is improved 05/28/16; her duplex ultrasound of the left leg specifically her left thigh did not show a DVT however did show hypoechogenic enlarged left inguinal lymph nodes up to 6.9 x 3.7 x 6.8. This also showed in the one in November however maximal diameter was 6.2, at that point I thought these were reactive secondary to cellulitis however there is obviously  a differential here. Her wound on the left lower leg is closed. She has a superficial open area on the base of her left fifth metatarsal head on the plantar foot. It looks as though she has some wrap injuries on the anterior leg 06/05/16; since she was last year she had a fall on 1/31. She was seen in the ER but sent back home. I think she is also been seen by her primary physician who picked up on the swelling and the lymphadenopathy. She is ordered a CT scan of the abdomen and pelvis however this will be without contrast because of stage IIIB renal insufficiency. Nevertheless this should be a start the workup to make sure there is not is more systemic problem here. She will probably need consideration of a biopsy of the lymph node in her inguinal area, this would need general surgery. As far as her wounds are concern today the area on the left plantar foot is healed. She has a small weeping area on the left lower leg. The wrap injury from last week has largely Pardoxically there is a lot less edema in the left thigh. 06/12/16; CT scan is on Friday. She has 2 small open areas one on the lateral left lower leg and one on the anterior lower leg on the left. Both of these looks healthy. I reduced her compression last week to Kerlix and Coban this  seems to have not a reasonable job 07/02/16; the patient is now in the skilled facility. I think this was arranged out of the ER when she fell at home although I'm not exactly sure. She is going for I think a laparotomy for the pelvic mass next week. All the wounds in the left leg have healed Readmission: 07/20/2020 upon evaluation today patient presents for reevaluation here in clinic unfortunately she is having issues with a significant stage III pressure ulcer to her left heel. This is something that occurred when she was in the hospital sick. And subsequently though she does not appear to have any new injury she is still trying to recover from the pressure injury which was quite severe during that time. I do not see any signs of infection right now which is great news. With that being said she has been require some debridement to try to clear this up as much as possible. We need to get the wound VAC cleared so that she can see overall healing and improvement that were looking for. The patient does have a history of hypertension, chronic kidney disease stage III, COPD, and multiple sclerosis. Currently she has been trying to do what she can to keep pressure off of her heel while in bed and in other locations as well. 08/04/20 upon evaluation today patient appears to be doing extremely well currently in regard to her wound on the heel. I do believe the Iodoflex has helped loosen things appear. Unfortunately she tells me that the home health nurse has not been putting the Iodoflex on the wound. That is news to [place] and that they tell me they got supplies in the mail but they did not see the Iodoflex being used. Nonetheless I am wondering if this is just something that they missed or if it truly has not been. I did tell them to look when they get home in their box of supplies to see if they saw the dressing which showed and what it should look like. 08/11/2020 upon evaluation today patient appears to be  doing decently  well in regard to her wound although unfortunately this is showing signs of some blue-green drainage consistent with Pseudomonas it does have a little worried in this regard. Fortunately there is no evidence of active infection which is great news. No fevers, chills, nausea, vomiting, or diarrhea. 08/18/2020 upon evaluation today patient appears to be doing well with regard to her wound. She has been tolerating the dressing changes without complication. Fortunately there is no signs of active infection at this time. No fevers, chills, nausea, vomiting, or diarrhea. 4/29; left heel wound pressure ulcer. Slightly smaller. 09/04/2020 upon evaluation today patient appears to be doing well with regard to her wound. This is actually on her heel and seems to be doing well other than the fact that is a little bit moist from excessive drainage. There does not appear to be any signs of active infection at this time. No fevers, chills, nausea, vomiting, or diarrhea.. 09/11/2020 upon evaluation today patient appears to be doing okay in regard to her wound. With that being said she still has quite a bit of drainage noted at this point. Fortunately there is no signs of active infection which is great news overall I am extremely pleased with where things stand today. I do think we need to try to do something a little bit more to help control the drainage a bit better. 5/23; left posterior heel. This actually looks quite good. Using Hydrofera Blue under compression. Dimensions are smaller 09/26/2020 upon evaluation today patient appears to be doing well with regard to her heel ulcer. In fact this is showing signs of excellent improvement and overall I am extremely pleased with where things stand today. I do not see any evidence of infection which is great news and overall I feel like she is headed in the correct direction. 10/09/2020 upon evaluation today patient appears to be doing excellent in regard to  her wound. She has been tolerating the dressing changes without complication. Fortunately there is no evidence of active infection which is also excellent news. No fevers, chills, nausea, vomiting, or diarrhea. 10/16/2020 upon evaluation today patient appears to be doing well with regard to her heel ulcer. Unfortunately she is having issues with an area of deep tissue injury/pressure injury over the shin due to the wrap being on way too tight today. Fortunately there does not appear to be any signs of infection currently. No fevers, chills, nausea, vomiting, or diarrhea. 10/23/2020 upon evaluation today patient appears to be doing better in regard to her wound. Fortunately there does not appear to be any signs of active infection at this time which is great news. In general I am extremely pleased with where things stand at this point. The patient does not show any signs of worsening which is excellent. She unfortunately still has the area in question with regard to the region over the anterior portion of her shin where she did have some deep tissue injury. Some of this is starting to open up a little bit. With that being said I think the biggest thing we need to do at this point is to try to as much as possible monitor for any additional breakdown and protect the region while it heals up. I think it is actually doing quite well which is great news. Fortunately there does not appear to be any evidence of active infection Electronic Signature(s) Signed: 10/23/2020 5:08:57 PM By: Irean Hong [name], [name] H. ([id number]) Entered By: Worthy Keeler on 10/23/2020 17:08:57 [name], [name]  Lemmie Evens ([id number]) -------------------------------------------------------------------------------- Physical Exam Details Patient Name: [name], [name] Date of Service: 10/23/2020 3:00 PM Medical Record Number: [id number] Patient Account Number: [id number] Date of Birth/Sex: [birth year] (69 y.o. F) Treating RN: Donnamarie Poag Primary Care Provider: [name] Other Clinician: Referring Provider: [name] Treating Provider/Extender: [name] in Treatment: 13 Constitutional Obese and well-hydrated in no acute distress. Respiratory normal breathing without difficulty. Psychiatric this patient is able to make decisions and demonstrates good insight into disease process. Alert and Oriented x 3. pleasant and cooperative. Notes Patient's wound patient's wound bed currently does not show any signs again of infection. Overall believe in that regard. With that being said I do believe that the patient is continuing to experience some difficulty with the anterior portion of her shin where she does have some breakdown here. Obviously I think that this is something that can easily be managed with appropriate and conservative care so far we have been doing a good job without over the past week I think this is already improved and a lot of it seems to be resolving this is great news. She did have some irritation in the Achilles region this appears it may have been more due to moisture breakdown here. I explained to the patient that sometimes this happens specifically I think it occurred in this case as a result of the Hydrofera Blue getting somewhat pushed back in regard to the compression wrap and where things were and subsequently it may have even caused some of the XtraSorb to rub on the back of her heel. I think for that reason we should try to see if we can avoid the Lauraine Rinne going forward. I discussed that with her today she is in agreement with that plan. Nonetheless I do believe that we can continue with the wound care measures otherwise as before and overall I think that the patient is doing quite well. Electronic Signature(s) Signed: 10/23/2020 5:10:44 PM By: Worthy Keeler PA-C Entered By: Worthy Keeler on 10/23/2020 17:10:44 [name]  ([id number]) -------------------------------------------------------------------------------- Physician Orders Details Patient Name: [name] Date of Service: 10/23/2020 3:00 PM Medical Record Number: [id number] Patient Account Number: [id number] Date of Birth/Sex: [birth year] (69 y.o. F) Treating RN: Donnamarie Poag Primary Care Provider: [name] Other Clinician: Referring Provider: [name] Treating Provider/Extender: [name] in Treatment: 13 Verbal / Phone Orders: No Diagnosis Coding ICD-10 Coding Code Description [phone] Pressure ulcer of left heel, stage 3 I10 Essential (primary) hypertension N18.30 Chronic kidney disease, stage 3 unspecified J44.9 Chronic obstructive pulmonary disease, unspecified G35 Multiple sclerosis Follow-up Appointments o Return Appointment in 1 week. [place]: - [facility] [place] for wound care. May utilize formulary equivalent dressing for wound treatment orders unless otherwise specified. Home Health Nurse may visit PRN to address patientos wound care needs. o Scheduled days for dressing changes to be completed; exception, patient has scheduled wound care visit that day. - Monday visits to Wound care center-we are closed 10/30/20 o **Please direct any NON-WOUND related issues/requests for orders to patient's Primary Care Physician. **If current dressing causes regression in wound condition, may D/C ordered dressing product/s and apply Normal Saline Moist Dressing daily until next [place] or Other MD appointment. **Notify Wound Healing Center of regression in wound condition at [phone]. Edema Control - Lymphedema / Segmental Compressive Device / Other Bilateral Lower Extremities o Elevate, Exercise Daily and Avoid Standing for Long Periods of Time. o  Elevate legs to the level of the heart and pump ankles as often as possible - ELEVATE LEGS o Elevate  leg(s) parallel to the floor when sitting. Off-Loading Left Lower Extremity o Open toe surgical shoe o Turn and reposition every 2 hours o Other: - Keep pressure off left heel Wound Treatment Wound #12 - Calcaneus Wound Laterality: Left Cleanser: Soap and Water 2 x Per Week/30 Days Discharge Instructions: Gently cleanse wound with antibacterial soap, rinse and pat dry prior to dressing wounds Peri-Wound Care: Desitin Maximum Strength Ointment 4 (oz) 2 x Per Week/30 Days Discharge Instructions: Apply around perimeter of wound for protection Primary Dressing: Hydrofera Blue Ready Transfer Foam, 4x5 (in/in) (Generic) 2 x Per Week/30 Days Discharge Instructions: (may cut bigger piece to ensure)DIRECTLY TO WOUND-Apply Hydrofera Blue Ready to wound bed as directed Secondary Dressing: ABD Pad 5x9 (in/in) 2 x Per Week/30 Days Discharge Instructions: over wound AND Line ABD pads or foam to left shin area and to achillies tendon area to left foot Secured With: 38M Medipore H Soft Cloth Surgical Tape, 2x2 (in/yd) 2 x Per Week/30 Days Discharge Instructions: Secure kerlix Secured With: Kerlix Roll Sterile or Non-Sterile 6-ply 4.5x4 (yd/yd) 2 x Per Week/30 Days Discharge Instructions: Apply Kerlix from base of toes to 3-finger-widths below knee [name], [name] H. ([id number]) Secured With: Tubigrip Size D, 3x10 (in/yd) 2 x Per Week/30 Days Discharge Instructions: Apply 3 Tubigrip D 3-finger-widths below knee to base of toes to secure dressing and/or for swelling. (extra one sent with patient) Notes ENSURE HYDROFERA BLUE IS DIRECTLY ON AND COVERING THE WOUND Electronic Signature(s) Signed: 10/27/2020 4:09:56 PM By: Worthy Keeler PA-C Signed: 10/27/2020 6:29:31 PM By: Carlene Coria RN Previous Signature: 10/23/2020 4:19:43 PM Version By: Donnamarie Poag Previous Signature: 10/23/2020 5:58:21 PM Version By: Worthy Keeler PA-C Entered By: Carlene Coria on 10/27/2020 09:16:20 [name], [name]  ([id number]) -------------------------------------------------------------------------------- Problem List Details Patient Name: [name] Date of Service: 10/23/2020 3:00 PM Medical Record Number: [id number] Patient Account Number: [id number] Date of Birth/Sex: May 17, 1950 (69 y.o. F) Treating RN: Donnamarie Poag Primary Care Provider: [name] Other Clinician: Referring Provider: [name] Treating Provider/Extender: [name] in Treatment: 13 Active Problems ICD-10 Encounter Code Description Active Date MDM Diagnosis [phone] Pressure ulcer of left heel, stage 3 07/20/2020 No Yes I10 Essential (primary) hypertension 07/20/2020 No Yes N18.30 Chronic kidney disease, stage 3 unspecified 07/20/2020 No Yes J44.9 Chronic obstructive pulmonary disease, unspecified 07/20/2020 No Yes G35 Multiple sclerosis 07/20/2020 No Yes Inactive Problems Resolved Problems Electronic Signature(s) Signed: 10/23/2020 3:14:24 PM By: Worthy Keeler PA-C Entered By: Worthy Keeler on 10/23/2020 15:14:23 [name], [name] ([id number]) -------------------------------------------------------------------------------- Progress Note Details Patient Name: [name] Date of Service: 10/23/2020 3:00 PM Medical Record Number: [id number] Patient Account Number: [id number] Date of Birth/Sex: Jun 18, 1950 (69 y.o. F) Treating RN: Donnamarie Poag Primary Care Provider: [name] Other Clinician: Referring Provider: [name] Treating Provider/Extender: [name] in Treatment: 13 Subjective Chief Complaint Information obtained from Patient Left Heel Pressure Ulcer History of Present Illness (HPI) 70 year old with a past medical history significant for MS, urinary incontinence, and obesity. She has been seen in the wound clinic before for lower extremity ulcerations treated with compression therapy. she is also known to have hypertension, peripheral vascular disease,  COPD, obstructive sleep apnea, lumbar radiculopathy, kyphoscoliosis, urinary issues and tobacco abuse. Smokes a packet of cigarettes a day was recently seen at the [facility] for swelling of her legs and feet  with a ulceration on the dorsum of the right foot which has been there for about 6 months. she was recently in the ER about a month ago where she was seen for shortness of breath and swelling of the legs and a chest x-ray was within normal limits had an increase in her BNP and was given Lasix and put on doxycycline for a mild cellulitis and possible UTI. Wounds aresmaller today 11/13/15. Debrided and will continue Santyl. 12/21/2015 -- she was admitted to the hospital overnight on 12/18/2015 and diagnosed with urinary retention and cellulitis of the left lower leg. is past to take clindamycin and use Santyl for her wounds. 01/15/2016 -- come back to see [place] for almost a month and continues to be noncompliant with her dressings 01/30/16 patient presents today for a follow-up visit concerning her ongoing bilateral lower extremity wounds. We have not seen her for the past 2 weeks although we are supposed to be seen her for weekly visits. She currently has a Foley catheter. She tells me that the wounds are intensely painful especially with pressure and palpation at this point in time. Fortunately she is having no interval signs or symptoms of systemic infection but unfortunately the wounds have not improved dramatically since we last saw her. She does have home health coming to take care of her as well. She is currently not in any compression wraps. 02/06/16 ON evaluation today patient continues to experience discomfort regarding her bilateral lower extremity ulcers. She has continued to tolerate the dressing changes at this point in time and continues to have a Foley catheter as well. Fortunately she is back this week in the past she has been somewhat noncompliant with follow-ups I'm  glad to see her today. She tells me that her pain level varies but can be as high as a 7 out of 10 with manipulation of the wound. She tells me that she used to be on oxycodone which was managed by the pain clinic although she is no longer on that as she tells me that she was actually smoking marijuana at the time and when this was found out they discontinued her pain medication. She no longer is taking anything pain medication wise and she also does not smoke cigarettes nor marijuana at this point. 02/12/16; this is a patient I don't believe I have previously seen. She has multiple sclerosis. She has 4 punched-out areas on the anterior lateral left leg 2 areas on the right these are all in the same condition. Reasonably small [dime size] wounds each was some depth. Surface of these wounds does not look particularly healthy as there is adherent slough. There is no evidence of surrounding infection or inflammation. ABIs in this clinic were 0.87 on the right and 0.81 on the left. She is listed as having PAD and is a smoker. Not a diabetic 02/20/16 patient I gave antibiotics to last week for erythema around both wound sites on the left lateral leg and right lateral leg. This appears to be a lot better. One of the areas on the left leg is healed however she still has 3 punched-out open areas on the left lateral calf and one on the right lateral calf and one on the dorsal foot. She is an ex-smoker quitting 1 month ago. ABIs in this clinic were 0.87 on the right 0.81 on the left 03/12/16; this is a patient I really don't have a good sense of. It would appear for the first 5 or 6 weeks  of her stay in this clinic she was cared for by Dr. Con Memos. She appeared on my schedule in mid October and I saw her twice. She has not been here however in 3 weeks. She has advanced Wound Care at home where she lives with her husband. She has multiple sclerosis. I saw her the first time she had 4 punched-out small wounds on  the anterior lateral left leg it appears that she is now down to 2. She also had wounds on the lateral and medial aspect of her right leg which were also small and punched out. The only one that remains is on the right lateral. Because of the nature of her wound I went ahead and ordered formal arterial studies this showed an ABI of 1 on the right and one on the left. TBI of 1.4 on the right and 0.79 on the left. She had normal waveforms. She was in the emergency room on 02/28/16; there is a noted edema of her left leg after a fall. She had a duplex ultrasound that showed no evidence for an acute DVT from the left groin to the popliteal fossa. The study was limited in the calf veins due to edema. Also noticeable that she had a left inguinal enlarged lymph node up to 6.2 cm. She also had a left knee x-ray that showed no acute findings and a right foot x-ray that showed soft tissue swelling but no radiographic evidence of osteomyelitis. The patient does stop smoking 03-19-16 Ms Hattabaugh presents today for evaluation of her bilateral lower extremity ulcerations. She states that she has not smoked in several weeks. She denies any pain or discomfort to bilateral lower extremities, tolerating compression therapy as ordered. 03/26/16; I have not seen this patient in 2 weeks however she has done very well with improvements in the areas in question. After we obtain normal arterial studies, increasing the 4-layer compression really seems to look done a nice job here. She has one open area on the left lateral leg and one on the medial right leg. These have filled in nicely and are now superficial wounds that showed epithelialized. I note the left inguinal lymph node at 6.2 cm. I may need to refer this back to the patient's primary doctor. 04/03/16; patient has 2 remaining wounds 1 on the left lateral leg and one on the right medial leg. Both of filled in nicely since we are able to increase her from a 3 layer to a 4  layer compression. I am also following up on the lymph node notable on her left leg DVT rule out in November 04/10/16; area on the right lateral leg is healed. The area on the left leg is still open but looks considerably better with healthy granulation and less wound area. I12/20/17; last week we healed the patient doubt with regards to the wound on the right leg to her on compression stocking 20-30 mmHg as it turns out I don't think she had a stocking, predictably therefore she has reopened. The left leg as well as the wound on the lateral aspect. Been generally small line 04/24/16; we healed out her right leg 2 weeks ago to her own 20-30 mm compression stockings although as it turns out she didn't have these and did not purchase some apparently because of financial issues. The left leg wound is on the lateral aspect. Both of these wounds are just about healed. 05/01/16; her wounds on the left leg are healed out today. The area on the  right leg is also healed. Vitamin diligent effort of our staff we have not been able to get any form of compression stocking to this patient. The orders for juxtalite's were given to home health this never materialized. We [name], [name] H. ([id number]) ordered compression stockings I don't think she was able to afford these. We had a discussion today with both the patient and her husband without these, will accumulate edema and the wounds will simply reopen again. 05/14/16 READMISSION this is a patient we healed out 2 weeks ago. As bilateral lower extremity venous wounds probably some degree of lymphedema. At the beginning in November I did a duplex ultrasound of her left leg that was negative for DVT but did show a lymph node in the left inguinal area presumably reactive. We've been using compression to her lower extremities eventually healed all her wounds on her bilateral calves but we were not able to get any form of compression stockings for her in spite of intense  efforts of our staff. She returns today with reopening on the left leg did not the right she has several superficial areas anteriorly but an actual frank ulcer on the lateral left calf. This is about the size of a dime or smaller. She does not really complain of pain. The patient has a history of PAD however arterial studies we did in November were normal. Her ABIs and Doppler waveforms were both within the normal limits. The patient has a history of MS. In discussing things with her today it would appear that the opening was noted 2 weeks ago according to the patient. We gave her Tubigrip stockings when she left the clinic 05/21/16; she has not yet had the duplex ultrasound of her thigh, this is booked for Thursday. The wound on the left lateral lower leg is improved 05/28/16; her duplex ultrasound of the left leg specifically her left thigh did not show a DVT however did show hypoechogenic enlarged left inguinal lymph nodes up to 6.9 x 3.7 x 6.8. This also showed in the one in November however maximal diameter was 6.2, at that point I thought these were reactive secondary to cellulitis however there is obviously a differential here. Her wound on the left lower leg is closed. She has a superficial open area on the base of her left fifth metatarsal head on the plantar foot. It looks as though she has some wrap injuries on the anterior leg 06/05/16; since she was last year she had a fall on 1/31. She was seen in the ER but sent back home. I think she is also been seen by her primary physician who picked up on the swelling and the lymphadenopathy. She is ordered a CT scan of the abdomen and pelvis however this will be without contrast because of stage IIIB renal insufficiency. Nevertheless this should be a start the workup to make sure there is not is more systemic problem here. She will probably need consideration of a biopsy of the lymph node in her inguinal area, this would need general surgery. As far as  her wounds are concern today the area on the left plantar foot is healed. She has a small weeping area on the left lower leg. The wrap injury from last week has largely Pardoxically there is a lot less edema in the left thigh. 06/12/16; CT scan is on Friday. She has 2 small open areas one on the lateral left lower leg and one on the anterior lower leg on the left. Both  of these looks healthy. I reduced her compression last week to Kerlix and Coban this seems to have not a reasonable job 07/02/16; the patient is now in the skilled facility. I think this was arranged out of the ER when she fell at home although I'm not exactly sure. She is going for I think a laparotomy for the pelvic mass next week. All the wounds in the left leg have healed Readmission: 07/20/2020 upon evaluation today patient presents for reevaluation here in clinic unfortunately she is having issues with a significant stage III pressure ulcer to her left heel. This is something that occurred when she was in the hospital sick. And subsequently though she does not appear to have any new injury she is still trying to recover from the pressure injury which was quite severe during that time. I do not see any signs of infection right now which is great news. With that being said she has been require some debridement to try to clear this up as much as possible. We need to get the wound VAC cleared so that she can see overall healing and improvement that were looking for. The patient does have a history of hypertension, chronic kidney disease stage III, COPD, and multiple sclerosis. Currently she has been trying to do what she can to keep pressure off of her heel while in bed and in other locations as well. 08/04/20 upon evaluation today patient appears to be doing extremely well currently in regard to her wound on the heel. I do believe the Iodoflex has helped loosen things appear. Unfortunately she tells me that the home health nurse has not  been putting the Iodoflex on the wound. That is news to [place] and that they tell me they got supplies in the mail but they did not see the Iodoflex being used. Nonetheless I am wondering if this is just something that they missed or if it truly has not been. I did tell them to look when they get home in their box of supplies to see if they saw the dressing which showed and what it should look like. 08/11/2020 upon evaluation today patient appears to be doing decently well in regard to her wound although unfortunately this is showing signs of some blue-green drainage consistent with Pseudomonas it does have a little worried in this regard. Fortunately there is no evidence of active infection which is great news. No fevers, chills, nausea, vomiting, or diarrhea. 08/18/2020 upon evaluation today patient appears to be doing well with regard to her wound. She has been tolerating the dressing changes without complication. Fortunately there is no signs of active infection at this time. No fevers, chills, nausea, vomiting, or diarrhea. 4/29; left heel wound pressure ulcer. Slightly smaller. 09/04/2020 upon evaluation today patient appears to be doing well with regard to her wound. This is actually on her heel and seems to be doing well other than the fact that is a little bit moist from excessive drainage. There does not appear to be any signs of active infection at this time. No fevers, chills, nausea, vomiting, or diarrhea.. 09/11/2020 upon evaluation today patient appears to be doing okay in regard to her wound. With that being said she still has quite a bit of drainage noted at this point. Fortunately there is no signs of active infection which is great news overall I am extremely pleased with where things stand today. I do think we need to try to do something a little bit more to help  control the drainage a bit better. 5/23; left posterior heel. This actually looks quite good. Using Hydrofera Blue under  compression. Dimensions are smaller 09/26/2020 upon evaluation today patient appears to be doing well with regard to her heel ulcer. In fact this is showing signs of excellent improvement and overall I am extremely pleased with where things stand today. I do not see any evidence of infection which is great news and overall I feel like she is headed in the correct direction. 10/09/2020 upon evaluation today patient appears to be doing excellent in regard to her wound. She has been tolerating the dressing changes without complication. Fortunately there is no evidence of active infection which is also excellent news. No fevers, chills, nausea, vomiting, or diarrhea. 10/16/2020 upon evaluation today patient appears to be doing well with regard to her heel ulcer. Unfortunately she is having issues with an area of deep tissue injury/pressure injury over the shin due to the wrap being on way too tight today. Fortunately there does not appear to be any signs of infection currently. No fevers, chills, nausea, vomiting, or diarrhea. 10/23/2020 upon evaluation today patient appears to be doing better in regard to her wound. Fortunately there does not appear to be any signs of active infection at this time which is great news. In general I am extremely pleased with where things stand at this point. The patient does not show any signs of worsening which is excellent. She unfortunately still has the area in question with regard to the region over the anterior portion of her shin where she did have some deep tissue injury. Some of this is starting to open up a little bit. With that being said I think the biggest thing we need to do at this point is to try to as much as possible monitor for any additional breakdown and protect the region while it heals up. I think it is actually doing quite well which is great news. Fortunately there does not appear to be any evidence of active infection [name], [name] H.  ([id number]) Objective Constitutional Obese and well-hydrated in no acute distress. Vitals Time Taken: 3:19 PM, Height: 64 in, Weight: 265 lbs, BMI: 45.5, Temperature: 98.2 F, Pulse: 75 bpm, Respiratory Rate: 18 breaths/min, Blood Pressure: 127/70 mmHg. Respiratory normal breathing without difficulty. Psychiatric this patient is able to make decisions and demonstrates good insight into disease process. Alert and Oriented x 3. pleasant and cooperative. General Notes: Patient's wound patient's wound bed currently does not show any signs again of infection. Overall believe in that regard. With that being said I do believe that the patient is continuing to experience some difficulty with the anterior portion of her shin where she does have some breakdown here. Obviously I think that this is something that can easily be managed with appropriate and conservative care so far we have been doing a good job without over the past week I think this is already improved and a lot of it seems to be resolving this is great news. She did have some irritation in the Achilles region this appears it may have been more due to moisture breakdown here. I explained to the patient that sometimes this happens specifically I think it occurred in this case as a result of the Hydrofera Blue getting somewhat pushed back in regard to the compression wrap and where things were and subsequently it may have even caused some of the XtraSorb to rub on the back of her heel. I think for that  reason we should try to see if we can avoid the Lauraine Rinne going forward. I discussed that with her today she is in agreement with that plan. Nonetheless I do believe that we can continue with the wound care measures otherwise as before and overall I think that the patient is doing quite well. Integumentary (Hair, Skin) Wound #12 status is Open. Original cause of wound was Pressure Injury. The date acquired was: 06/27/2020. The wound has been in  treatment 13 weeks. The wound is located on the Left Calcaneus. The wound measures 1.7cm length x 2.5cm width x 0.2cm depth; 3.338cm^2 area and 0.668cm^3 volume. There is Fat Layer (Subcutaneous Tissue) exposed. There is no tunneling or undermining noted. There is a medium amount of serous drainage noted. The wound margin is thickened. There is medium (34-66%) red, pink, pale granulation within the wound bed. There is a small (1-33%) amount of necrotic tissue within the wound bed including Adherent Slough. Assessment Active Problems ICD-10 Pressure ulcer of left heel, stage 3 Essential (primary) hypertension Chronic kidney disease, stage 3 unspecified Chronic obstructive pulmonary disease, unspecified Multiple sclerosis Plan Follow-up Appointments: Return Appointment in 1 week. Home Health: [place] for wound care. May utilize formulary equivalent dressing for wound treatment orders unless otherwise specified. Home Health Nurse may visit PR[facility] to address patient s wound care needs. Scheduled days for dressing changes to be completed; exception, patient has scheduled wound care visit that day. - Monday visits to Wound care center-we are closed 10/30/20 **Please direct any NON-WOUND related issues/requests for orders to patient's Primary Care Physician. **If current dressing causes regression in wound condition, may D/C ordered dressing product/s and apply Normal Saline Moist Dressing daily until next [place] or Other MD appointment. **Notify Wound Healing Center of regression in wound condition at [phone]. Edema Control - Lymphedema / Segmental Compressive Device / Other: EMMAMAE, MCNAMARA. ([id number]) Elevate, Exercise Daily and Avoid Standing for Long Periods of Time. Elevate legs to the level of the heart and pump ankles as often as possible - ELEVATE LEGS Elevate leg(s) parallel to the floor when sitting. Off-Loading: Open toe  surgical shoe Turn and reposition every 2 hours Other: - Keep pressure off left heel General Notes: ENSURE HYDROFERA BLUE IS DIRECTLY ON AND COVERING THE WOUND WOUND #12: - Calcaneus Wound Laterality: Left Cleanser: Soap and Water 2 x Per Week/30 Days Discharge Instructions: Gently cleanse wound with antibacterial soap, rinse and pat dry prior to dressing wounds Peri-Wound Care: Desitin Maximum Strength Ointment 4 (oz) 2 x Per Week/30 Days Discharge Instructions: Apply around perimeter of wound for protection Primary Dressing: Hydrofera Blue Ready Transfer Foam, 4x5 (in/in) (Generic) 2 x Per Week/30 Days Discharge Instructions: (may cut bigger piece to ensure)DIRECTLY TO WOUND-Apply Hydrofera Blue Ready to wound bed as directed Secondary Dressing: ABD Pad 5x9 (in/in) 2 x Per Week/30 Days Discharge Instructions: over wound AND Line ABD pads on shin area Secured With: 58M Medipore H Soft Cloth Surgical Tape, 2x2 (in/yd) 2 x Per Week/30 Days Discharge Instructions: Secure kerlix Secured With: [employer] Sterile or Non-Sterile 6-ply 4.5x4 (yd/yd) 2 x Per Week/30 Days Discharge Instructions: Apply Kerlix from base of toes to 3-finger-widths below knee Secured With: Tubigrip Size D, 3x10 (in/yd) 2 x Per Week/30 Days Discharge Instructions: Apply 3 Tubigrip D 3-finger-widths below knee to base of toes to secure dressing and/or for swelling. (extra one sent with patient) 1. Would recommend currently that we go ahead and  discontinue XtraSorb we will just use an ABD pad. 2. I did recommend putting in the notes for [place] to actually use a larger piece of the Hydrofera Blue so that this will hopefully not matter if it just slipped remove a little bit in relation to where the wound is. Otherwise I am concerned that if this continues related as it may cause some breakdown in the Achilles region we definitely do not want that. The patient voiced understanding. Overall I think that doing quite well  otherwise and I think we are seeing signs of improvement which is great news. We will see patient back for reevaluation in 1 week here in the clinic. If anything worsens or changes patient will contact our office for additional recommendations. Electronic Signature(s) Signed: 10/23/2020 5:12:15 PM By: Worthy Keeler PA-C Entered By: Worthy Keeler on 10/23/2020 17:12:15 [name] ([id number]) -------------------------------------------------------------------------------- SuperBill Details Patient Name: [name] Date of Service: 10/23/2020 Medical Record Number: [id number] Patient Account Number: [id number] Date of Birth/Sex: 02/13/1951 (69 y.o. F) Treating RN: Donnamarie Poag Primary Care Provider: [name] Other Clinician: Referring Provider: [name] Treating Provider/Extender: [name] in Treatment: 13 Diagnosis Coding ICD-10 Codes Code Description [phone] Pressure ulcer of left heel, stage 3 I10 Essential (primary) hypertension N18.30 Chronic kidney disease, stage 3 unspecified J44.9 Chronic obstructive pulmonary disease, unspecified G35 Multiple sclerosis Facility Procedures CPT4 Code: [id number] Description: [id number] - WOUND CARE VISIT-LEV 2 EST PT Modifier: Quantity: 1 Physician Procedures CPT4 Code: [id number] Description: 99213 - WC PHYS LEVEL 3 - EST PT Modifier: Quantity: 1 CPT4 Code: Description: ICD-10 Diagnosis Description L89.623 Pressure ulcer of left heel, stage 3 I10 Essential (primary) hypertension N18.30 Chronic kidney disease, stage 3 unspecified J44.9 Chronic obstructive pulmonary disease, unspecified Modifier: Quantity: Electronic Signature(s) Signed: 10/23/2020 5:12:38 PM By: Worthy Keeler PA-C Entered By: Worthy Keeler on 10/23/2020 17:12:37

## 2020-10-31 ENCOUNTER — Ambulatory Visit: Payer: Medicare Other | Admitting: Physician Assistant

## 2020-11-03 ENCOUNTER — Ambulatory Visit: Payer: Medicare Other | Admitting: Physician Assistant

## 2020-11-13 ENCOUNTER — Encounter: Payer: Medicare HMO | Attending: Physician Assistant | Admitting: Physician Assistant

## 2020-11-13 ENCOUNTER — Other Ambulatory Visit: Payer: Self-pay

## 2020-11-13 DIAGNOSIS — I890 Lymphedema, not elsewhere classified: Secondary | ICD-10-CM | POA: Diagnosis not present

## 2020-11-13 DIAGNOSIS — G629 Polyneuropathy, unspecified: Secondary | ICD-10-CM | POA: Diagnosis not present

## 2020-11-13 DIAGNOSIS — F17210 Nicotine dependence, cigarettes, uncomplicated: Secondary | ICD-10-CM | POA: Diagnosis not present

## 2020-11-13 DIAGNOSIS — G35 Multiple sclerosis: Secondary | ICD-10-CM | POA: Diagnosis not present

## 2020-11-13 DIAGNOSIS — I739 Peripheral vascular disease, unspecified: Secondary | ICD-10-CM | POA: Diagnosis not present

## 2020-11-13 DIAGNOSIS — L89623 Pressure ulcer of left heel, stage 3: Secondary | ICD-10-CM | POA: Insufficient documentation

## 2020-11-13 DIAGNOSIS — Z86718 Personal history of other venous thrombosis and embolism: Secondary | ICD-10-CM | POA: Insufficient documentation

## 2020-11-13 DIAGNOSIS — N183 Chronic kidney disease, stage 3 unspecified: Secondary | ICD-10-CM | POA: Diagnosis not present

## 2020-11-13 DIAGNOSIS — I129 Hypertensive chronic kidney disease with stage 1 through stage 4 chronic kidney disease, or unspecified chronic kidney disease: Secondary | ICD-10-CM | POA: Insufficient documentation

## 2020-11-13 NOTE — Progress Notes (Addendum)
BESSE, MIRON ([id number]) Visit Report for 11/13/2020 Arrival Information Details Patient Name: [name] Date of Service: 11/13/2020 12:30 PM Medical Record Number: [id number] Patient Account Number: [id number] Date of Birth/Sex: May 05, 1950 (69 y.o. F) Treating RN: [name] Primary Care [place] Gipe: [name] Other Clinician: Referring Corvette Orser: [name] Treating Regan Mcbryar/Extender: [name] in Treatment: 16 Visit Information History Since Last Visit Has Dressing in Place as Prescribed: No Patient Arrived: Wheel Chair Pain Present Now: No Arrival Time: 12:53 Accompanied By: self Transfer Assistance: Manual Patient Identification Verified: Yes Secondary Verification Process Completed: Yes Patient Requires Transmission-Based Precautions: No Patient Has Alerts: Yes Electronic Signature(s) Signed: 11/13/2020 2:04:47 PM By: [name] RN Entered By: [name] on 11/13/2020 12:53:51 [name], [name] ([id number]) -------------------------------------------------------------------------------- Clinic Level of Care Assessment Details Patient Name: [name] Date of Service: 11/13/2020 12:30 PM Medical Record Number: [id number] Patient Account Number: [id number] Date of Birth/Sex: August 17, 1950 (69 y.o. F) Treating RN: [name] Primary Care Avrom Robarts: [name] Other Clinician: Referring Tysheena Ginzburg: [name] Treating Mikhayla Phillis/Extender: [name] in Treatment: 16 Clinic Level of Care Assessment Items TOOL 1 Quantity Score '[]'  - Use when EandM and Procedure is performed on INITIAL visit 0 ASSESSMENTS - Nursing Assessment / Reassessment '[]'  - General Physical Exam (combine w/ comprehensive assessment (listed just below) when performed on new 0 pt. evals) '[]'  - 0 Comprehensive Assessment (HX, ROS, Risk Assessments, Wounds Hx, etc.) ASSESSMENTS - Wound and Skin Assessment / Reassessment '[]'  - Dermatologic / Skin  Assessment (not related to wound area) 0 ASSESSMENTS - Ostomy and/or Continence Assessment and Care '[]'  - Incontinence Assessment and Management 0 '[]'  - 0 Ostomy Care Assessment and Management (repouching, etc.) PROCESS - Coordination of Care '[]'  - Simple Patient / Family Education for ongoing care 0 '[]'  - 0 Complex (extensive) Patient / Family Education for ongoing care '[]'  - 0 Staff obtains Programmer, systems, Records, Test Results / Process Orders '[]'  - 0 Staff telephones HHA, Nursing Homes / Clarify orders / etc '[]'  - 0 Routine Transfer to another Facility (non-emergent condition) '[]'  - 0 Routine Hospital Admission (non-emergent condition) '[]'  - 0 New Admissions / Biomedical engineer / Ordering NPWT, Apligraf, etc. '[]'  - 0 Emergency Hospital Admission (emergent condition) PROCESS - Special Needs '[]'  - Pediatric / Minor Patient Management 0 '[]'  - 0 Isolation Patient Management '[]'  - 0 Hearing / Language / Visual special needs '[]'  - 0 Assessment of Community assistance (transportation, D/C planning, etc.) '[]'  - 0 Additional assistance / Altered mentation '[]'  - 0 Support Surface(s) Assessment (bed, cushion, seat, etc.) INTERVENTIONS - Miscellaneous '[]'  - External ear exam 0 '[]'  - 0 Patient Transfer (multiple staff / Civil Service fast streamer / Similar devices) '[]'  - 0 Simple Staple / Suture removal (25 or less) '[]'  - 0 Complex Staple / Suture removal (26 or more) '[]'  - 0 Hypo/Hyperglycemic Management (do not check if billed separately) '[]'  - 0 Ankle / Brachial Index (ABI) - do not check if billed separately Has the patient been seen at the hospital within the last three years: Yes Total Score: 0 Level Of Care: ____ [name] ([id number]) Electronic Signature(s) Signed: 11/13/2020 2:04:47 PM By: [name] RN Entered By: [name] on 11/13/2020 13:26:24 [name] ([id number]) -------------------------------------------------------------------------------- Complex / Palliative Patient  Assessment Details Patient Name: [name] Date of Service: 11/13/2020 12:30 PM Medical Record Number: [id number] Patient Account Number: [id number] Date of Birth/Sex: [birth year] (69 y.o. F) Treating RN: [name] Primary Care  Giorgia Wahler: [name] Other Clinician: Referring Lucille Witts: [name] Treating Aliyyah Riese/Extender: [name] in Treatment: 16 Palliative Management Criteria Complex Wound Management Criteria Patient has remarkable or complex co-morbidities requiring medications or treatments that extend wound healing times. Examples: o Diabetes mellitus with chronic renal failure or end stage renal disease requiring dialysis o Advanced or poorly controlled rheumatoid arthritis o Diabetes mellitus and end stage chronic obstructive pulmonary disease o Active cancer with current chemo- or radiation therapy Care Approach Wound Care Plan: Complex Wound Management Electronic Signature(s) Signed: 11/20/2020 8:40:18 AM By: [name] RN Signed: 12/27/2020 5:26:53 PM By: Worthy Keeler PA-C Entered By: [name] on 11/20/2020 08:40:18 [name] ([id number]) -------------------------------------------------------------------------------- Compression Therapy Details Patient Name: [name] Date of Service: 11/13/2020 12:30 PM Medical Record Number: [id number] Patient Account Number: [id number] Date of Birth/Sex: Sep 11, 1950 (69 y.o. F) Treating RN: [name] Primary Care Lashina Milles: [name] Other Clinician: Referring Chad Tiznado: [name] Treating Rodolfo Gaster/Extender: [name] in Treatment: 16 Compression Therapy Performed for Wound Assessment: Wound #12 Left Calcaneus Performed By: Clinician [name], RN Compression Type: Three Layer Pre Treatment ABI: 1.1 Post Procedure Diagnosis Same as Pre-procedure Electronic Signature(s) Signed: 11/13/2020 2:04:47 PM By: [name] RN Entered By:  [name] on 11/13/2020 13:26:16 [name] ([id number]) -------------------------------------------------------------------------------- Encounter Discharge Information Details Patient Name: [name] Date of Service: 11/13/2020 12:30 PM Medical Record Number: [id number] Patient Account Number: [id number] Date of Birth/Sex: June 23, 1950 (69 y.o. F) Treating RN: [name] Primary Care Trenice Mesa: [name] Other Clinician: Referring Demiyah Fischbach: [name] Treating Aveah Castell/Extender: [name] in Treatment: 16 Encounter Discharge Information Items Discharge Condition: Stable Ambulatory Status: Wheelchair Discharge Destination: Home Transportation: Private Auto Accompanied By: self Schedule Follow-up Appointment: Yes Clinical Summary of Care: Electronic Signature(s) Signed: 11/13/2020 2:04:47 PM By: [name] RN Entered By: [name] on 11/13/2020 13:27:11 [name] ([id number]) -------------------------------------------------------------------------------- Lower Extremity Assessment Details Patient Name: [name] Date of Service: 11/13/2020 12:30 PM Medical Record Number: [id number] Patient Account Number: [id number] Date of Birth/Sex: 1950/07/09 (69 y.o. F) Treating RN: [name] Primary Care Laretta Pyatt: [name] Other Clinician: Referring Lavaughn Haberle: [name] Treating Monchel Pollitt/Extender: [name] Weeks in Treatment: 16 Edema Assessment Assessed: Shirlyn Goltz: Yes] [Right: No] Edema: [Left: Ye] [Right: s] Vascular Assessment Pulses: Dorsalis Pedis Palpable: [Left:Yes] Electronic Signature(s) Signed: 11/13/2020 2:04:47 PM By: [name] RN Entered By: [name] on 11/13/2020 12:59:19 Peretz, [name] ([id number]) -------------------------------------------------------------------------------- Multi Wound Chart Details Patient Name: [name] Date of Service: 11/13/2020 12:30  PM Medical Record Number: [id number] Patient Account Number: [id number] Date of Birth/Sex: 08/19/1950 (69 y.o. F) Treating RN: [name] Primary Care Melrose Kearse: [name] Other Clinician: Referring Tacia Hindley: [name] Treating Fia Hebert/Extender: [name] in Treatment: 16 Vital Signs Height(in): 7 Pulse(bpm): 47 Weight(lbs): 265 Blood Pressure(mmHg): 138/80 Body Mass Index(BMI): 45 Temperature(F): 98.6 Respiratory Rate(breaths/min): 18 Photos: [N/A:N/A] Wound Location: Left Calcaneus N/A N/A Wounding Event: Pressure Injury N/A N/A Primary Etiology: Pressure Ulcer N/A N/A Comorbid History: Cataracts, Chronic Obstructive N/A N/A Pulmonary Disease (COPD), Sleep Apnea, Hypertension, Osteoarthritis, Neuropathy Date Acquired: 06/27/2020 N/A N/A Weeks of Treatment: 16 N/A N/A Wound Status: Open N/A N/A Measurements L x W x D (cm) 2x2x0.3 N/A N/A Area (cm) : 3.142 N/A N/A Volume (cm) : 0.942 N/A N/A % Reduction in Area: 38.50% N/A N/A % Reduction in Volume: 7.70% N/A N/A Classification: Category/Stage III N/A N/A Exudate Amount: Medium N/A N/A Exudate  Type: Serous N/A N/A Exudate Color: amber N/A N/A Wound Margin: Thickened N/A N/A Granulation Amount: Medium (34-66%) N/A N/A Granulation Quality: Red, Pink N/A N/A Necrotic Amount: Medium (34-66%) N/A N/A Exposed Structures: Fat Layer (Subcutaneous Tissue): N/A N/A Yes Fascia: No Tendon: No Muscle: No Joint: No Bone: No Epithelialization: None N/A N/A Treatment Notes Electronic Signature(s) Signed: 11/13/2020 2:04:47 PM By: [name] RN Entered By: [name] on 11/13/2020 13:01:08 [name] ([id number]) -------------------------------------------------------------------------------- [place] Details Patient Name: [name] Date of Service: 11/13/2020 12:30 PM Medical Record Number: [id number] Patient Account Number: [id number] Date of Birth/Sex:  1951/01/22 (69 y.o. F) Treating RN: [name] Primary Care Alana Dayton: [name] Other Clinician: Referring Adriano Bischof: [name] Treating Marilynn Ekstein/Extender: [name] in Treatment: 16 Active Inactive Wound/Skin Impairment Nursing Diagnoses: Impaired tissue integrity Goals: Patient/caregiver will verbalize understanding of skin care regimen Date Initiated: 07/20/2020 Date Inactivated: 09/04/2020 Target Resolution Date: 09/19/2020 Goal Status: Met Ulcer/skin breakdown will have a volume reduction of 30% by week 4 Date Initiated: 07/20/2020 Date Inactivated: 08/25/2020 Target Resolution Date: 08/20/2020 Goal Status: Met Ulcer/skin breakdown will have a volume reduction of 50% by week 8 Date Initiated: 07/20/2020 Date Inactivated: 10/09/2020 Target Resolution Date: 09/19/2020 Goal Status: Met Ulcer/skin breakdown will have a volume reduction of 80% by week 12 Date Initiated: 07/20/2020 Target Resolution Date: 10/20/2020 Goal Status: Active Ulcer/skin breakdown will heal within 14 weeks Date Initiated: 07/20/2020 Target Resolution Date: 11/19/2020 Goal Status: Active Interventions: Assess patient/caregiver ability to obtain necessary supplies Assess patient/caregiver ability to perform ulcer/skin care regimen upon admission and as needed Provide education on ulcer and skin care Treatment Activities: Referred to DME Armando Bukhari for dressing supplies : 07/20/2020 Skin care regimen initiated : 07/20/2020 Notes: Electronic Signature(s) Signed: 11/13/2020 2:04:47 PM By: [name] RN Entered By: [name] on 11/13/2020 13:01:00 [name] ([id number]) -------------------------------------------------------------------------------- Pain Assessment Details Patient Name: [name] Date of Service: 11/13/2020 12:30 PM Medical Record Number: [id number] Patient Account Number: [id number] Date of Birth/Sex: 05-Oct-1950 (69 y.o. F) Treating RN: [name] Primary Care Shada Nienaber: [name] Other Clinician: Referring Venie Montesinos: [name] Treating Cathlene Gardella/Extender: [name] in Treatment: 16 Active Problems Location of Pain Severity and Description of Pain Patient Has Paino No Site Locations Rate the pain. Current Pain Level: 0 Pain Management and Medication Current Pain Management: Electronic Signature(s) Signed: 11/13/2020 2:04:47 PM By: [name] RN Entered By: [name] on 11/13/2020 12:54:16 [name] ([id number]) -------------------------------------------------------------------------------- Patient/Caregiver Education Details Patient Name: [name] Date of Service: 11/13/2020 12:30 PM Medical Record Number: [id number] Patient Account Number: [id number] Date of Birth/Gender: October 20, 1950 (69 y.o. F) Treating RN: [name] Primary Care Physician: [name] Other Clinician: Referring Physician: [name] Treating Physician/Extender: [name] in Treatment: 16 Education Assessment Education Provided To: Patient Education Topics Provided Wound/Skin Impairment: Methods: Explain/Verbal Responses: State content correctly Electronic Signature(s) Signed: 11/13/2020 2:04:47 PM By: [name] RN Entered By: [name] on 11/13/2020 13:26:41 [name] ([id number]) -------------------------------------------------------------------------------- Wound Assessment Details Patient Name: [name] Date of Service: 11/13/2020 12:30 PM Medical Record Number: [id number] Patient Account Number: [id number] Date of Birth/Sex: Jun 08, 1950 (69 y.o. F) Treating RN: [name] Primary Care Elbert Polyakov: [name] Other Clinician: Referring Malichi Palardy: [name] Treating Vanity Larsson/Extender: [name] in Treatment: 16 Wound Status Wound Number: 12 Primary Pressure Ulcer Etiology: Wound Location: Left Calcaneus Wound  Open Wounding Event: Pressure Injury Status: Date Acquired:  06/27/2020 Comorbid Cataracts, Chronic Obstructive Pulmonary Disease Weeks Of Treatment: 16 History: (COPD), Sleep Apnea, Hypertension, Osteoarthritis, Clustered Wound: No Neuropathy Photos Wound Measurements Length: (cm) 2 Width: (cm) 2 Depth: (cm) 0.3 Area: (cm) 3.142 Volume: (cm) 0.942 % Reduction in Area: 38.5% % Reduction in Volume: 7.7% Epithelialization: None Tunneling: No Undermining: No Wound Description Classification: Category/Stage III Wound Margin: Thickened Exudate Amount: Medium Exudate Type: Serous Exudate Color: amber Foul Odor After Cleansing: No Slough/Fibrino Yes Wound Bed Granulation Amount: Medium (34-66%) Exposed Structure Granulation Quality: Red, Pink Fascia Exposed: No Necrotic Amount: Medium (34-66%) Fat Layer (Subcutaneous Tissue) Exposed: Yes Necrotic Quality: Adherent Slough Tendon Exposed: No Muscle Exposed: No Joint Exposed: No Bone Exposed: No Electronic Signature(s) Signed: 11/13/2020 2:04:47 PM By: [name] RN Entered By: [name] on 11/13/2020 12:57:50 [name] ([id number]) -------------------------------------------------------------------------------- Vitals Details Patient Name: [name] Date of Service: 11/13/2020 12:30 PM Medical Record Number: [id number] Patient Account Number: [id number] Date of Birth/Sex: 17-May-1950 (69 y.o. F) Treating RN: [name] Primary Care [place] Fritze: [name] Other Clinician: Referring Joseff Luckman: [name] Treating Chenelle Benning/Extender: [name] in Treatment: 16 Vital Signs Time Taken: 12:53 Temperature (F): 98.6 Height (in): 64 Pulse (bpm): 77 Weight (lbs): 265 Respiratory Rate (breaths/min): 18 Body Mass Index (BMI): 45.5 Blood Pressure (mmHg): 138/80 Reference Range: 80 - 120 mg / dl Electronic Signature(s) Signed: 11/13/2020 2:04:47 PM By: [name] RN Entered By:  [name] on 11/13/2020 12:54:08

## 2020-11-13 NOTE — Progress Notes (Addendum)
RUT, BETTERTON ([id number]) Visit Report for 11/13/2020 Chief Complaint Document Details Patient Name: [name], [name] Date of Service: 11/13/2020 12:30 PM Medical Record Number: [id number] Patient Account Number: [id number] Date of Birth/Sex: May 26, 1950 (69 y.o. F) Treating RN: [name] Primary Care Provider: [name] Other Clinician: Referring Provider: [name] Treating Provider/Extender: [name] in Treatment: 16 Information Obtained from: Patient Chief Complaint Left Heel Pressure Ulcer Electronic Signature(s) Signed: 11/13/2020 12:59:49 PM By: Worthy Keeler PA-C Entered By: Worthy Keeler on 11/13/2020 12:59:49 [name] ([id number]) -------------------------------------------------------------------------------- HPI Details Patient Name: [name] Date of Service: 11/13/2020 12:30 PM Medical Record Number: [id number] Patient Account Number: [id number] Date of Birth/Sex: 1951-01-06 (69 y.o. F) Treating RN: [name] Primary Care Provider: [name] Other Clinician: Referring Provider: [name] Treating Provider/Extender: [name] in Treatment: 16 History of Present Illness HPI Description: 70 year old with a past medical history significant for MS, urinary incontinence, and obesity. She has been seen in the wound clinic before for lower extremity ulcerations treated with compression therapy. she is also known to have hypertension, peripheral vascular disease, COPD, obstructive sleep apnea, lumbar radiculopathy, kyphoscoliosis, urinary issues and tobacco abuse. Smokes a packet of cigarettes a day was recently seen at the [facility] for swelling of her legs and feet with a ulceration on the dorsum of the right foot which has been there for about 6 months. she was recently in the ER about a month ago where she was seen for shortness of breath and swelling of the legs and a chest x-ray was  within normal limits had an increase in her BNP and was given Lasix and put on doxycycline for a mild cellulitis and possible UTI. Wounds aresmaller today 11/13/15. Debrided and will continue Santyl. 12/21/2015 -- she was admitted to the hospital overnight on 12/18/2015 and diagnosed with urinary retention and cellulitis of the left lower leg. is past to take clindamycin and use Santyl for her wounds. 01/15/2016 -- come back to see [place] for almost a month and continues to be noncompliant with her dressings 01/30/16 patient presents today for a follow-up visit concerning her ongoing bilateral lower extremity wounds. We have not seen her for the past 2 weeks although we are supposed to be seen her for weekly visits. She currently has a Foley catheter. She tells me that the wounds are intensely painful especially with pressure and palpation at this point in time. Fortunately she is having no interval signs or symptoms of systemic infection but unfortunately the wounds have not improved dramatically since we last saw her. She does have home health coming to take care of her as well. She is currently not in any compression wraps. 02/06/16 ON evaluation today patient continues to experience discomfort regarding her bilateral lower extremity ulcers. She has continued to tolerate the dressing changes at this point in time and continues to have a Foley catheter as well. Fortunately she is back this week in the past she has been somewhat noncompliant with follow-ups I'm glad to see her today. She tells me that her pain level varies but can be as high as a 7 out of 10 with manipulation of the wound. She tells me that she used to be on oxycodone which was managed by the pain clinic although she is no longer on that as she tells me that she was actually smoking marijuana at the time and when this was found out they discontinued her pain medication. She no longer  is taking anything pain medication wise and she also  does not smoke cigarettes nor marijuana at this point. 02/12/16; this is a patient I don't believe I have previously seen. She has multiple sclerosis. She has 4 punched-out areas on the anterior lateral left leg 2 areas on the right these are all in the same condition. Reasonably small [dime size] wounds each was some depth. Surface of these wounds does not look particularly healthy as there is adherent slough. There is no evidence of surrounding infection or inflammation. ABIs in this clinic were 0.87 on the right and 0.81 on the left. She is listed as having PAD and is a smoker. Not a diabetic 02/20/16 patient I gave antibiotics to last week for erythema around both wound sites on the left lateral leg and right lateral leg. This appears to be a lot better. One of the areas on the left leg is healed however she still has 3 punched-out open areas on the left lateral calf and one on the right lateral calf and one on the dorsal foot. She is an ex-smoker quitting 1 month ago. ABIs in this clinic were 0.87 on the right 0.81 on the left 03/12/16; this is a patient I really don't have a good sense of. It would appear for the first 5 or 6 weeks of her stay in this clinic she was cared for by Dr. Con Memos. She appeared on my schedule in mid October and I saw her twice. She has not been here however in 3 weeks. She has advanced Wound Care at home where she lives with her husband. She has multiple sclerosis. I saw her the first time she had 4 punched-out small wounds on the anterior lateral left leg it appears that she is now down to 2. She also had wounds on the lateral and medial aspect of her right leg which were also small and punched out. The only one that remains is on the right lateral. Because of the nature of her wound I went ahead and ordered formal arterial studies this showed an ABI of 1 on the right and one on the left. TBI of 1.4 on the right and 0.79 on the left. She had normal waveforms. She was in  the emergency room on 02/28/16; there is a noted edema of her left leg after a fall. She had a duplex ultrasound that showed no evidence for an acute DVT from the left groin to the popliteal fossa. The study was limited in the calf veins due to edema. Also noticeable that she had a left inguinal enlarged lymph node up to 6.2 cm. She also had a left knee x-ray that showed no acute findings and a right foot x-ray that showed soft tissue swelling but no radiographic evidence of osteomyelitis. The patient does stop smoking 03-19-16 Ms Mccarn presents today for evaluation of her bilateral lower extremity ulcerations. She states that she has not smoked in several weeks. She denies any pain or discomfort to bilateral lower extremities, tolerating compression therapy as ordered. 03/26/16; I have not seen this patient in 2 weeks however she has done very well with improvements in the areas in question. After we obtain normal arterial studies, increasing the 4-layer compression really seems to look done a nice job here. She has one open area on the left lateral leg and one on the medial right leg. These have filled in nicely and are now superficial wounds that showed epithelialized. I note the left inguinal lymph node at  6.2 cm. I may need to refer this back to the patient's primary doctor. 04/03/16; patient has 2 remaining wounds 1 on the left lateral leg and one on the right medial leg. Both of filled in nicely since we are able to increase her from a 3 layer to a 4 layer compression. I am also following up on the lymph node notable on her left leg DVT rule out in November 04/10/16; area on the right lateral leg is healed. The area on the left leg is still open but looks considerably better with healthy granulation and less wound area. I12/20/17; last week we healed the patient doubt with regards to the wound on the right leg to her on compression stocking 20-30 mmHg as it turns out I don't think she had a  stocking, predictably therefore she has reopened. The left leg as well as the wound on the lateral aspect. Been generally small line 04/24/16; we healed out her right leg 2 weeks ago to her own 20-30 mm compression stockings although as it turns out she didn't have these and did not purchase some apparently because of financial issues. The left leg wound is on the lateral aspect. Both of these wounds are just about healed. 05/01/16; her wounds on the left leg are healed out today. The area on the right leg is also healed. Vitamin diligent effort of our staff we have not been able to get any form of compression stocking to this patient. The orders for juxtalite's were given to home health this never materialized. We ordered compression stockings I don't think she was able to afford these. We had a discussion today with both the patient and her husband without these, will accumulate edema and the wounds will simply reopen again. 05/14/16 READMISSION [name], [name] ([id number]) this is a patient we healed out 2 weeks ago. As bilateral lower extremity venous wounds probably some degree of lymphedema. At the beginning in November I did a duplex ultrasound of her left leg that was negative for DVT but did show a lymph node in the left inguinal area presumably reactive. We've been using compression to her lower extremities eventually healed all her wounds on her bilateral calves but we were not able to get any form of compression stockings for her in spite of intense efforts of our staff. She returns today with reopening on the left leg did not the right she has several superficial areas anteriorly but an actual frank ulcer on the lateral left calf. This is about the size of a dime or smaller. She does not really complain of pain. The patient has a history of PAD however arterial studies we did in November were normal. Her ABIs and Doppler waveforms were both within the normal limits. The patient has a  history of MS. In discussing things with her today it would appear that the opening was noted 2 weeks ago according to the patient. We gave her Tubigrip stockings when she left the clinic 05/21/16; she has not yet had the duplex ultrasound of her thigh, this is booked for Thursday. The wound on the left lateral lower leg is improved 05/28/16; her duplex ultrasound of the left leg specifically her left thigh did not show a DVT however did show hypoechogenic enlarged left inguinal lymph nodes up to 6.9 x 3.7 x 6.8. This also showed in the one in November however maximal diameter was 6.2, at that point I thought these were reactive secondary to cellulitis however there is obviously  a differential here. Her wound on the left lower leg is closed. She has a superficial open area on the base of her left fifth metatarsal head on the plantar foot. It looks as though she has some wrap injuries on the anterior leg 06/05/16; since she was last year she had a fall on 1/31. She was seen in the ER but sent back home. I think she is also been seen by her primary physician who picked up on the swelling and the lymphadenopathy. She is ordered a CT scan of the abdomen and pelvis however this will be without contrast because of stage IIIB renal insufficiency. Nevertheless this should be a start the workup to make sure there is not is more systemic problem here. She will probably need consideration of a biopsy of the lymph node in her inguinal area, this would need general surgery. As far as her wounds are concern today the area on the left plantar foot is healed. She has a small weeping area on the left lower leg. The wrap injury from last week has largely Pardoxically there is a lot less edema in the left thigh. 06/12/16; CT scan is on Friday. She has 2 small open areas one on the lateral left lower leg and one on the anterior lower leg on the left. Both of these looks healthy. I reduced her compression last week to Kerlix  and Coban this seems to have not a reasonable job 07/02/16; the patient is now in the skilled facility. I think this was arranged out of the ER when she fell at home although I'm not exactly sure. She is going for I think a laparotomy for the pelvic mass next week. All the wounds in the left leg have healed Readmission: 07/20/2020 upon evaluation today patient presents for reevaluation here in clinic unfortunately she is having issues with a significant stage III pressure ulcer to her left heel. This is something that occurred when she was in the hospital sick. And subsequently though she does not appear to have any new injury she is still trying to recover from the pressure injury which was quite severe during that time. I do not see any signs of infection right now which is great news. With that being said she has been require some debridement to try to clear this up as much as possible. We need to get the wound VAC cleared so that she can see overall healing and improvement that were looking for. The patient does have a history of hypertension, chronic kidney disease stage III, COPD, and multiple sclerosis. Currently she has been trying to do what she can to keep pressure off of her heel while in bed and in other locations as well. 08/04/20 upon evaluation today patient appears to be doing extremely well currently in regard to her wound on the heel. I do believe the Iodoflex has helped loosen things appear. Unfortunately she tells me that the home health nurse has not been putting the Iodoflex on the wound. That is news to [place] and that they tell me they got supplies in the mail but they did not see the Iodoflex being used. Nonetheless I am wondering if this is just something that they missed or if it truly has not been. I did tell them to look when they get home in their box of supplies to see if they saw the dressing which showed and what it should look like. 08/11/2020 upon evaluation today patient  appears to be doing decently  well in regard to her wound although unfortunately this is showing signs of some blue-green drainage consistent with Pseudomonas it does have a little worried in this regard. Fortunately there is no evidence of active infection which is great news. No fevers, chills, nausea, vomiting, or diarrhea. 08/18/2020 upon evaluation today patient appears to be doing well with regard to her wound. She has been tolerating the dressing changes without complication. Fortunately there is no signs of active infection at this time. No fevers, chills, nausea, vomiting, or diarrhea. 4/29; left heel wound pressure ulcer. Slightly smaller. 09/04/2020 upon evaluation today patient appears to be doing well with regard to her wound. This is actually on her heel and seems to be doing well other than the fact that is a little bit moist from excessive drainage. There does not appear to be any signs of active infection at this time. No fevers, chills, nausea, vomiting, or diarrhea.. 09/11/2020 upon evaluation today patient appears to be doing okay in regard to her wound. With that being said she still has quite a bit of drainage noted at this point. Fortunately there is no signs of active infection which is great news overall I am extremely pleased with where things stand today. I do think we need to try to do something a little bit more to help control the drainage a bit better. 5/23; left posterior heel. This actually looks quite good. Using Hydrofera Blue under compression. Dimensions are smaller 09/26/2020 upon evaluation today patient appears to be doing well with regard to her heel ulcer. In fact this is showing signs of excellent improvement and overall I am extremely pleased with where things stand today. I do not see any evidence of infection which is great news and overall I feel like she is headed in the correct direction. 10/09/2020 upon evaluation today patient appears to be doing excellent  in regard to her wound. She has been tolerating the dressing changes without complication. Fortunately there is no evidence of active infection which is also excellent news. No fevers, chills, nausea, vomiting, or diarrhea. 10/16/2020 upon evaluation today patient appears to be doing well with regard to her heel ulcer. Unfortunately she is having issues with an area of deep tissue injury/pressure injury over the shin due to the wrap being on way too tight today. Fortunately there does not appear to be any signs of infection currently. No fevers, chills, nausea, vomiting, or diarrhea. 10/23/2020 upon evaluation today patient appears to be doing better in regard to her wound. Fortunately there does not appear to be any signs of active infection at this time which is great news. In general I am extremely pleased with where things stand at this point. The patient does not show any signs of worsening which is excellent. She unfortunately still has the area in question with regard to the region over the anterior portion of her shin where she did have some deep tissue injury. Some of this is starting to open up a little bit. With that being said I think the biggest thing we need to do at this point is to try to as much as possible monitor for any additional breakdown and protect the region while it heals up. I think it is actually doing quite well which is great news. Fortunately there does not appear to be any evidence of active infection 11/14/2018 upon evaluation today patient appears to be doing okay in regard to her ischial. Her leg is much more swollen though she has not  had a wrap on any type of dressing for some time when home health came out they did not have the Tubigrip. We have not actually seen her since June 27. The patient when I asked her about it seemed to get somewhat upset to be honest. She seems to be having some trouble at home. She states that she had to "raise hell" just to be able to get  here to this appointment today. With that being said I really do think she needs to have [name], [name] H. ([id number]) this wraps and keep the swelling under control and was doing a whole lot better but now seems to be not doing quite as well to be honest. The anterior portion of her shin does seem to be healing and doing better although it did end up opening into the wound. Again that was where home health the wrap does have a little bit too tight causing this issue previously. Electronic Signature(s) Signed: 11/13/2020 1:12:35 PM By: Worthy Keeler PA-C Entered By: Worthy Keeler on 11/13/2020 13:12:35 [name] ([id number]) -------------------------------------------------------------------------------- Physical Exam Details Patient Name: [name] Date of Service: 11/13/2020 12:30 PM Medical Record Number: [id number] Patient Account Number: [id number] Date of Birth/Sex: [birth year] (69 y.o. F) Treating RN: [name] Primary Care Provider: [name] Other Clinician: Referring Provider: [name] Treating Provider/Extender: [name] in Treatment: 16 Constitutional Obese and well-hydrated in no acute distress. Respiratory normal breathing without difficulty. Psychiatric this patient is able to make decisions and demonstrates good insight into disease process. Alert and Oriented x 3. pleasant and cooperative. Notes Upon inspection patient's wound bed actually showed signs of some slough on the surface of the wound sharp debridement was not necessary though mechanical debridement with saline gauze was performed she tolerated that without complication postdebridement wound bed appears to be doing much better. Electronic Signature(s) Signed: 11/13/2020 1:12:51 PM By: Worthy Keeler PA-C Entered By: Worthy Keeler on 11/13/2020 13:12:51 [name]  ([id number]) -------------------------------------------------------------------------------- Physician Orders Details Patient Name: [name] Date of Service: 11/13/2020 12:30 PM Medical Record Number: [id number] Patient Account Number: [id number] Date of Birth/Sex: [birth year] (69 y.o. F) Treating RN: [name] Primary Care Provider: [name] Other Clinician: Referring Provider: [name] Treating Provider/Extender: [name] in Treatment: [phone] Verbal / Phone Orders: No Diagnosis Coding ICD-10 Coding Code Description [phone] Pressure ulcer of left heel, stage 3 I10 Essential (primary) hypertension N18.30 Chronic kidney disease, stage 3 unspecified J44.9 Chronic obstructive pulmonary disease, unspecified G35 Multiple sclerosis Follow-up Appointments o Return Appointment in 2 weeks. [place]: - [facility] [place] for wound care. May utilize formulary equivalent dressing for wound treatment orders unless otherwise specified. Home Health Nurse may visit PRN to address patientos wound care needs. o Scheduled days for dressing changes to be completed; exception, patient has scheduled wound care visit that day. - Monday visits to Wound care center o **Please direct any NON-WOUND related issues/requests for orders to patient's Primary Care Physician. **If current dressing causes regression in wound condition, may D/C ordered dressing product/s and apply Normal Saline Moist Dressing daily until next [place] or Other MD appointment. **Notify Wound Healing Center of regression in wound condition at [phone]. Edema Control - Lymphedema / Segmental Compressive Device / Other Bilateral Lower Extremities o Elevate, Exercise Daily and Avoid Standing for Long Periods of Time. o Elevate legs to the level of the heart and pump ankles as  often as possible - ELEVATE LEGS o Elevate leg(s) parallel  to the floor when sitting. Off-Loading Left Lower Extremity o Open toe surgical shoe o Turn and reposition every 2 hours o Other: - Keep pressure off left heel Wound Treatment Wound #12 - Calcaneus Wound Laterality: Left Cleanser: Soap and Water 2 x Per Week/30 Days Discharge Instructions: Gently cleanse wound with antibacterial soap, rinse and pat dry prior to dressing wounds Peri-Wound Care: Desitin Maximum Strength Ointment 4 (oz) 2 x Per Week/30 Days Discharge Instructions: Apply periwound for protection Primary Dressing: Hydrofera Blue Ready Transfer Foam, 4x5 (in/in) (Generic) 2 x Per Week/30 Days Discharge Instructions: Apply to wound-ensure hydrofera blue touches wound bed Secondary Dressing: ABD Pad 5x9 (in/in) 2 x Per Week/30 Days Discharge Instructions: Apply to secure hydrofera blue AND on shin area for protection Compression Wrap: CoFlex TLC Lite 2Layer Compression System, 25 to 30 mmHg 2 x Per Week/30 Days Electronic Signature(s) Signed: 11/13/2020 2:04:47 PM By: [name] RN [name], [name] ([id number]) Signed: 11/13/2020 5:02:09 PM By: Worthy Keeler PA-C Entered By: [name] on 11/13/2020 13:23:46 [name], [name] ([id number]) -------------------------------------------------------------------------------- Problem List Details Patient Name: [name] Date of Service: 11/13/2020 12:30 PM Medical Record Number: [id number] Patient Account Number: [id number] Date of Birth/Sex: 03-Oct-1950 (69 y.o. F) Treating RN: [name] Primary Care Provider: [name] Other Clinician: Referring Provider: [name] Treating Provider/Extender: [name] in Treatment: 16 Active Problems ICD-10 Encounter Code Description Active Date MDM Diagnosis [phone] Pressure ulcer of left heel, stage 3 07/20/2020 No Yes I10 Essential (primary) hypertension 07/20/2020 No Yes N18.30 Chronic kidney disease, stage 3 unspecified 07/20/2020 No Yes J44.9  Chronic obstructive pulmonary disease, unspecified 07/20/2020 No Yes G35 Multiple sclerosis 07/20/2020 No Yes Inactive Problems Resolved Problems Electronic Signature(s) Signed: 11/13/2020 12:59:43 PM By: Worthy Keeler PA-C Entered By: Worthy Keeler on 11/13/2020 12:59:43 [name], [name] ([id number]) -------------------------------------------------------------------------------- Progress Note Details Patient Name: [name] Date of Service: 11/13/2020 12:30 PM Medical Record Number: [id number] Patient Account Number: [id number] Date of Birth/Sex: [birth year] (69 y.o. F) Treating RN: [name] Primary Care Provider: [name] Other Clinician: Referring Provider: [name] Treating Provider/Extender: [name] in Treatment: 16 Subjective Chief Complaint Information obtained from Patient Left Heel Pressure Ulcer History of Present Illness (HPI) 70 year old with a past medical history significant for MS, urinary incontinence, and obesity. She has been seen in the wound clinic before for lower extremity ulcerations treated with compression therapy. she is also known to have hypertension, peripheral vascular disease, COPD, obstructive sleep apnea, lumbar radiculopathy, kyphoscoliosis, urinary issues and tobacco abuse. Smokes a packet of cigarettes a day was recently seen at the [facility] for swelling of her legs and feet with a ulceration on the dorsum of the right foot which has been there for about 6 months. she was recently in the ER about a month ago where she was seen for shortness of breath and swelling of the legs and a chest x-ray was within normal limits had an increase in her BNP and was given Lasix and put on doxycycline for a mild cellulitis and possible UTI. Wounds aresmaller today 11/13/15. Debrided and will continue Santyl. 12/21/2015 -- she was admitted to the hospital overnight on 12/18/2015 and diagnosed with urinary  retention and cellulitis of the left lower leg. is past to take clindamycin and use Santyl for her wounds. 01/15/2016 -- come back to see [place] for almost a month and continues to  be noncompliant with her dressings 01/30/16 patient presents today for a follow-up visit concerning her ongoing bilateral lower extremity wounds. We have not seen her for the past 2 weeks although we are supposed to be seen her for weekly visits. She currently has a Foley catheter. She tells me that the wounds are intensely painful especially with pressure and palpation at this point in time. Fortunately she is having no interval signs or symptoms of systemic infection but unfortunately the wounds have not improved dramatically since we last saw her. She does have home health coming to take care of her as well. She is currently not in any compression wraps. 02/06/16 ON evaluation today patient continues to experience discomfort regarding her bilateral lower extremity ulcers. She has continued to tolerate the dressing changes at this point in time and continues to have a Foley catheter as well. Fortunately she is back this week in the past she has been somewhat noncompliant with follow-ups I'm glad to see her today. She tells me that her pain level varies but can be as high as a 7 out of 10 with manipulation of the wound. She tells me that she used to be on oxycodone which was managed by the pain clinic although she is no longer on that as she tells me that she was actually smoking marijuana at the time and when this was found out they discontinued her pain medication. She no longer is taking anything pain medication wise and she also does not smoke cigarettes nor marijuana at this point. 02/12/16; this is a patient I don't believe I have previously seen. She has multiple sclerosis. She has 4 punched-out areas on the anterior lateral left leg 2 areas on the right these are all in the same condition. Reasonably small [dime size]  wounds each was some depth. Surface of these wounds does not look particularly healthy as there is adherent slough. There is no evidence of surrounding infection or inflammation. ABIs in this clinic were 0.87 on the right and 0.81 on the left. She is listed as having PAD and is a smoker. Not a diabetic 02/20/16 patient I gave antibiotics to last week for erythema around both wound sites on the left lateral leg and right lateral leg. This appears to be a lot better. One of the areas on the left leg is healed however she still has 3 punched-out open areas on the left lateral calf and one on the right lateral calf and one on the dorsal foot. She is an ex-smoker quitting 1 month ago. ABIs in this clinic were 0.87 on the right 0.81 on the left 03/12/16; this is a patient I really don't have a good sense of. It would appear for the first 5 or 6 weeks of her stay in this clinic she was cared for by Dr. Con Memos. She appeared on my schedule in mid October and I saw her twice. She has not been here however in 3 weeks. She has advanced Wound Care at home where she lives with her husband. She has multiple sclerosis. I saw her the first time she had 4 punched-out small wounds on the anterior lateral left leg it appears that she is now down to 2. She also had wounds on the lateral and medial aspect of her right leg which were also small and punched out. The only one that remains is on the right lateral. Because of the nature of her wound I went ahead and ordered formal arterial studies this showed  an ABI of 1 on the right and one on the left. TBI of 1.4 on the right and 0.79 on the left. She had normal waveforms. She was in the emergency room on 02/28/16; there is a noted edema of her left leg after a fall. She had a duplex ultrasound that showed no evidence for an acute DVT from the left groin to the popliteal fossa. The study was limited in the calf veins due to edema. Also noticeable that she had a left inguinal  enlarged lymph node up to 6.2 cm. She also had a left knee x-ray that showed no acute findings and a right foot x-ray that showed soft tissue swelling but no radiographic evidence of osteomyelitis. The patient does stop smoking 03-19-16 Ms Honsinger presents today for evaluation of her bilateral lower extremity ulcerations. She states that she has not smoked in several weeks. She denies any pain or discomfort to bilateral lower extremities, tolerating compression therapy as ordered. 03/26/16; I have not seen this patient in 2 weeks however she has done very well with improvements in the areas in question. After we obtain normal arterial studies, increasing the 4-layer compression really seems to look done a nice job here. She has one open area on the left lateral leg and one on the medial right leg. These have filled in nicely and are now superficial wounds that showed epithelialized. I note the left inguinal lymph node at 6.2 cm. I may need to refer this back to the patient's primary doctor. 04/03/16; patient has 2 remaining wounds 1 on the left lateral leg and one on the right medial leg. Both of filled in nicely since we are able to increase her from a 3 layer to a 4 layer compression. I am also following up on the lymph node notable on her left leg DVT rule out in November 04/10/16; area on the right lateral leg is healed. The area on the left leg is still open but looks considerably better with healthy granulation and less wound area. I12/20/17; last week we healed the patient doubt with regards to the wound on the right leg to her on compression stocking 20-30 mmHg as it turns out I don't think she had a stocking, predictably therefore she has reopened. The left leg as well as the wound on the lateral aspect. Been generally small line 04/24/16; we healed out her right leg 2 weeks ago to her own 20-30 mm compression stockings although as it turns out she didn't have these and did not purchase some  apparently because of financial issues. The left leg wound is on the lateral aspect. Both of these wounds are just about healed. 05/01/16; her wounds on the left leg are healed out today. The area on the right leg is also healed. Vitamin diligent effort of our staff we have not been able to get any form of compression stocking to this patient. The orders for juxtalite's were given to home health this never materialized. We [name], [name] H. ([id number]) ordered compression stockings I don't think she was able to afford these. We had a discussion today with both the patient and her husband without these, will accumulate edema and the wounds will simply reopen again. 05/14/16 READMISSION this is a patient we healed out 2 weeks ago. As bilateral lower extremity venous wounds probably some degree of lymphedema. At the beginning in November I did a duplex ultrasound of her left leg that was negative for DVT but did show a lymph node  in the left inguinal area presumably reactive. We've been using compression to her lower extremities eventually healed all her wounds on her bilateral calves but we were not able to get any form of compression stockings for her in spite of intense efforts of our staff. She returns today with reopening on the left leg did not the right she has several superficial areas anteriorly but an actual frank ulcer on the lateral left calf. This is about the size of a dime or smaller. She does not really complain of pain. The patient has a history of PAD however arterial studies we did in November were normal. Her ABIs and Doppler waveforms were both within the normal limits. The patient has a history of MS. In discussing things with her today it would appear that the opening was noted 2 weeks ago according to the patient. We gave her Tubigrip stockings when she left the clinic 05/21/16; she has not yet had the duplex ultrasound of her thigh, this is booked for Thursday. The wound on the left  lateral lower leg is improved 05/28/16; her duplex ultrasound of the left leg specifically her left thigh did not show a DVT however did show hypoechogenic enlarged left inguinal lymph nodes up to 6.9 x 3.7 x 6.8. This also showed in the one in November however maximal diameter was 6.2, at that point I thought these were reactive secondary to cellulitis however there is obviously a differential here. Her wound on the left lower leg is closed. She has a superficial open area on the base of her left fifth metatarsal head on the plantar foot. It looks as though she has some wrap injuries on the anterior leg 06/05/16; since she was last year she had a fall on 1/31. She was seen in the ER but sent back home. I think she is also been seen by her primary physician who picked up on the swelling and the lymphadenopathy. She is ordered a CT scan of the abdomen and pelvis however this will be without contrast because of stage IIIB renal insufficiency. Nevertheless this should be a start the workup to make sure there is not is more systemic problem here. She will probably need consideration of a biopsy of the lymph node in her inguinal area, this would need general surgery. As far as her wounds are concern today the area on the left plantar foot is healed. She has a small weeping area on the left lower leg. The wrap injury from last week has largely Pardoxically there is a lot less edema in the left thigh. 06/12/16; CT scan is on Friday. She has 2 small open areas one on the lateral left lower leg and one on the anterior lower leg on the left. Both of these looks healthy. I reduced her compression last week to Kerlix and Coban this seems to have not a reasonable job 07/02/16; the patient is now in the skilled facility. I think this was arranged out of the ER when she fell at home although I'm not exactly sure. She is going for I think a laparotomy for the pelvic mass next week. All the wounds in the left leg have  healed Readmission: 07/20/2020 upon evaluation today patient presents for reevaluation here in clinic unfortunately she is having issues with a significant stage III pressure ulcer to her left heel. This is something that occurred when she was in the hospital sick. And subsequently though she does not appear to have any new injury she is  still trying to recover from the pressure injury which was quite severe during that time. I do not see any signs of infection right now which is great news. With that being said she has been require some debridement to try to clear this up as much as possible. We need to get the wound VAC cleared so that she can see overall healing and improvement that were looking for. The patient does have a history of hypertension, chronic kidney disease stage III, COPD, and multiple sclerosis. Currently she has been trying to do what she can to keep pressure off of her heel while in bed and in other locations as well. 08/04/20 upon evaluation today patient appears to be doing extremely well currently in regard to her wound on the heel. I do believe the Iodoflex has helped loosen things appear. Unfortunately she tells me that the home health nurse has not been putting the Iodoflex on the wound. That is news to [place] and that they tell me they got supplies in the mail but they did not see the Iodoflex being used. Nonetheless I am wondering if this is just something that they missed or if it truly has not been. I did tell them to look when they get home in their box of supplies to see if they saw the dressing which showed and what it should look like. 08/11/2020 upon evaluation today patient appears to be doing decently well in regard to her wound although unfortunately this is showing signs of some blue-green drainage consistent with Pseudomonas it does have a little worried in this regard. Fortunately there is no evidence of active infection which is great news. No fevers, chills,  nausea, vomiting, or diarrhea. 08/18/2020 upon evaluation today patient appears to be doing well with regard to her wound. She has been tolerating the dressing changes without complication. Fortunately there is no signs of active infection at this time. No fevers, chills, nausea, vomiting, or diarrhea. 4/29; left heel wound pressure ulcer. Slightly smaller. 09/04/2020 upon evaluation today patient appears to be doing well with regard to her wound. This is actually on her heel and seems to be doing well other than the fact that is a little bit moist from excessive drainage. There does not appear to be any signs of active infection at this time. No fevers, chills, nausea, vomiting, or diarrhea.. 09/11/2020 upon evaluation today patient appears to be doing okay in regard to her wound. With that being said she still has quite a bit of drainage noted at this point. Fortunately there is no signs of active infection which is great news overall I am extremely pleased with where things stand today. I do think we need to try to do something a little bit more to help control the drainage a bit better. 5/23; left posterior heel. This actually looks quite good. Using Hydrofera Blue under compression. Dimensions are smaller 09/26/2020 upon evaluation today patient appears to be doing well with regard to her heel ulcer. In fact this is showing signs of excellent improvement and overall I am extremely pleased with where things stand today. I do not see any evidence of infection which is great news and overall I feel like she is headed in the correct direction. 10/09/2020 upon evaluation today patient appears to be doing excellent in regard to her wound. She has been tolerating the dressing changes without complication. Fortunately there is no evidence of active infection which is also excellent news. No fevers, chills, nausea, vomiting, or diarrhea.  10/16/2020 upon evaluation today patient appears to be doing well with  regard to her heel ulcer. Unfortunately she is having issues with an area of deep tissue injury/pressure injury over the shin due to the wrap being on way too tight today. Fortunately there does not appear to be any signs of infection currently. No fevers, chills, nausea, vomiting, or diarrhea. 10/23/2020 upon evaluation today patient appears to be doing better in regard to her wound. Fortunately there does not appear to be any signs of active infection at this time which is great news. In general I am extremely pleased with where things stand at this point. The patient does not show any signs of worsening which is excellent. She unfortunately still has the area in question with regard to the region over the anterior portion of her shin where she did have some deep tissue injury. Some of this is starting to open up a little bit. With that being said I think the biggest thing we need to do at this point is to try to as much as possible monitor for any additional breakdown and protect the region while it heals up. I think it is actually doing quite well which is great news. Fortunately there does not appear to be any evidence of active infection [name], [name]. ([id number]) 11/14/2018 upon evaluation today patient appears to be doing okay in regard to her ischial. Her leg is much more swollen though she has not had a wrap on any type of dressing for some time when home health came out they did not have the Tubigrip. We have not actually seen her since June 27. The patient when I asked her about it seemed to get somewhat upset to be honest. She seems to be having some trouble at home. She states that she had to "raise hell" just to be able to get here to this appointment today. With that being said I really do think she needs to have this wraps and keep the swelling under control and was doing a whole lot better but now seems to be not doing quite as well to be honest. The anterior portion of her shin does  seem to be healing and doing better although it did end up opening into the wound. Again that was where home health the wrap does have a little bit too tight causing this issue previously. Objective Constitutional Obese and well-hydrated in no acute distress. Vitals Time Taken: 12:53 PM, Height: 64 in, Weight: 265 lbs, BMI: 45.5, Temperature: 98.6 F, Pulse: 77 bpm, Respiratory Rate: 18 breaths/min, Blood Pressure: 138/80 mmHg. Respiratory normal breathing without difficulty. Psychiatric this patient is able to make decisions and demonstrates good insight into disease process. Alert and Oriented x 3. pleasant and cooperative. General Notes: Upon inspection patient's wound bed actually showed signs of some slough on the surface of the wound sharp debridement was not necessary though mechanical debridement with saline gauze was performed she tolerated that without complication postdebridement wound bed appears to be doing much better. Integumentary (Hair, Skin) Wound #12 status is Open. Original cause of wound was Pressure Injury. The date acquired was: 06/27/2020. The wound has been in treatment 16 weeks. The wound is located on the Left Calcaneus. The wound measures 2cm length x 2cm width x 0.3cm depth; 3.142cm^2 area and 0.942cm^3 volume. There is Fat Layer (Subcutaneous Tissue) exposed. There is no tunneling or undermining noted. There is a medium amount of serous drainage noted. The wound margin is thickened. There is  medium (34-66%) red, pink granulation within the wound bed. There is a medium (34-66%) amount of necrotic tissue within the wound bed including Adherent Slough. Assessment Active Problems ICD-10 Pressure ulcer of left heel, stage 3 Essential (primary) hypertension Chronic kidney disease, stage 3 unspecified Chronic obstructive pulmonary disease, unspecified Multiple sclerosis Plan Follow-up Appointments: Return Appointment in 1 week. Home Health: [facility] for wound care. May utilize formulary equivalent dressing for wound treatment orders unless otherwise specified. Home Health Nurse may visit PRN to address patient s wound care needs. Scheduled days for dressing changes to be completed; exception, patient has scheduled wound care visit that day. - Monday visits to Wound care center **Please direct any NON-WOUND related issues/requests for orders to patient's Primary Care Physician. **If current dressing causes regression in wound condition, may D/C ordered dressing product/s and apply Normal Saline Moist Dressing daily until next [place] or Other MD appointment. **Notify Wound Healing Center of regression in wound condition at [phone]. [name], [name] ([id number]) Edema Control - Lymphedema / Segmental Compressive Device / Other: Elevate, Exercise Daily and Avoid Standing for Long Periods of Time. Elevate legs to the level of the heart and pump ankles as often as possible - ELEVATE LEGS Elevate leg(s) parallel to the floor when sitting. Off-Loading: Open toe surgical shoe Turn and reposition every 2 hours Other: - Keep pressure off left heel WOUND #12: - Calcaneus Wound Laterality: Left Cleanser: Soap and Water 2 x Per Week/30 Days Discharge Instructions: Gently cleanse wound with antibacterial soap, rinse and pat dry prior to dressing wounds Peri-Wound Care: Desitin Maximum Strength Ointment 4 (oz) 2 x Per Week/30 Days Discharge Instructions: Apply periwound for protection Primary Dressing: Hydrofera Blue Ready Transfer Foam, 4x5 (in/in) (Generic) 2 x Per Week/30 Days Discharge Instructions: Apply to wound-ensure hydrofera blue touches wound bed Secondary Dressing: ABD Pad 5x9 (in/in) 2 x Per Week/30 Days Discharge Instructions: Apply to secure hydrofera blue AND on shin area for protection Compression Wrap: CoFlex TLC Lite 2Layer Compression System, 25 to 30 mmHg 2 x Per Week/30 Days 1.  Would recommend currently that we going to continue with the wound care measures as before and the patient is in agreement with the plan. This includes the use of the Hydrofera Blue dressing to the wound bed I think that is good to be the ideal thing here. 2. I am also can recommend at this point that we use the Coflex 2 layer dressing I think this will be a little bit softer and less likely to cause any problems for her to also more quality control to ensure that the wrap is applied appropriately and not too tightly. Again I think this is good to be the best option for her currently. We will see patient back for reevaluation in 2 weeks here in the clinic. If anything worsens or changes patient will contact our office for additional recommendations. Electronic Signature(s) Signed: 11/13/2020 1:22:19 PM By: Worthy Keeler PA-C Entered By: Worthy Keeler on 11/13/2020 13:22:19 [name] ([id number]) -------------------------------------------------------------------------------- SuperBill Details Patient Name: [name] Date of Service: 11/13/2020 Medical Record Number: [id number] Patient Account Number: [id number] Date of Birth/Sex: 1950/05/27 (69 y.o. F) Treating RN: [name] Primary Care Provider: [name] Other Clinician: Referring Provider: [name] Treating Provider/Extender: [name] in Treatment: 16 Diagnosis Coding ICD-10 Codes Code Description [phone] Pressure ulcer of left heel, stage 3 I10 Essential (primary) hypertension N18.30 Chronic kidney  disease, stage 3 unspecified J44.9 Chronic obstructive pulmonary disease, unspecified G35 Multiple sclerosis Facility Procedures CPT4 Code: [id number] Description: (Facility Use Only) [phone] - [place] LWR LT LEG Modifier: Quantity: 1 Physician Procedures CPT4 Code: [id number] Description: 72902 - WC PHYS LEVEL 3 - EST PT Modifier: Quantity: 1 CPT4 Code: Description: ICD-10  Diagnosis Description L89.623 Pressure ulcer of left heel, stage 3 I10 Essential (primary) hypertension N18.30 Chronic kidney disease, stage 3 unspecified J44.9 Chronic obstructive pulmonary disease, unspecified Modifier: Quantity: Electronic Signature(s) Signed: 11/13/2020 2:04:47 PM By: [name] RN Signed: 11/13/2020 5:02:09 PM By: Worthy Keeler PA-C Previous Signature: 11/13/2020 1:23:44 PM Version By: Worthy Keeler PA-C Entered By: [name] on 11/13/2020 13:26:32

## 2020-11-27 ENCOUNTER — Other Ambulatory Visit: Payer: Self-pay

## 2020-11-27 ENCOUNTER — Encounter: Payer: Medicare HMO | Attending: Physician Assistant | Admitting: Physician Assistant

## 2020-11-27 DIAGNOSIS — I739 Peripheral vascular disease, unspecified: Secondary | ICD-10-CM | POA: Diagnosis not present

## 2020-11-27 DIAGNOSIS — L89623 Pressure ulcer of left heel, stage 3: Secondary | ICD-10-CM | POA: Diagnosis not present

## 2020-11-27 DIAGNOSIS — Z9119 Patient's noncompliance with other medical treatment and regimen: Secondary | ICD-10-CM | POA: Insufficient documentation

## 2020-11-27 DIAGNOSIS — L97822 Non-pressure chronic ulcer of other part of left lower leg with fat layer exposed: Secondary | ICD-10-CM | POA: Diagnosis not present

## 2020-11-27 DIAGNOSIS — I129 Hypertensive chronic kidney disease with stage 1 through stage 4 chronic kidney disease, or unspecified chronic kidney disease: Secondary | ICD-10-CM | POA: Insufficient documentation

## 2020-11-27 DIAGNOSIS — Z6841 Body Mass Index (BMI) 40.0 and over, adult: Secondary | ICD-10-CM | POA: Diagnosis not present

## 2020-11-27 DIAGNOSIS — J449 Chronic obstructive pulmonary disease, unspecified: Secondary | ICD-10-CM | POA: Diagnosis not present

## 2020-11-27 DIAGNOSIS — E669 Obesity, unspecified: Secondary | ICD-10-CM | POA: Insufficient documentation

## 2020-11-27 DIAGNOSIS — Z86718 Personal history of other venous thrombosis and embolism: Secondary | ICD-10-CM | POA: Diagnosis not present

## 2020-11-27 DIAGNOSIS — N183 Chronic kidney disease, stage 3 unspecified: Secondary | ICD-10-CM | POA: Insufficient documentation

## 2020-11-27 DIAGNOSIS — F17210 Nicotine dependence, cigarettes, uncomplicated: Secondary | ICD-10-CM | POA: Insufficient documentation

## 2020-11-27 DIAGNOSIS — G35 Multiple sclerosis: Secondary | ICD-10-CM | POA: Insufficient documentation

## 2020-11-27 NOTE — Progress Notes (Addendum)
[name], [name] Visit Report for 11/27/2020 Chief Complaint Document Details Patient Name: [name], [name] Date of Service: 11/27/2020 2:45 PM Medical Record Number: [id number] Patient Account Number: [id number] Date of Birth/Sex: 17-Dec-1950 (69 y.o. F) Treating RN: [name] Primary Care Provider: [name] Other Clinician: Referring Provider: [name] Treating Provider/Extender: [name] in Treatment: 18 Information Obtained from: Patient Chief Complaint Left Heel Pressure Ulcer Electronic Signature(s) Signed: 11/27/2020 3:07:21 PM By: Worthy Keeler PA-C Entered By: Worthy Keeler on 11/27/2020 15:07:21 [name] -------------------------------------------------------------------------------- Debridement Details Patient Name: [name] Date of Service: 11/27/2020 2:45 PM Medical Record Number: [id number] Patient Account Number: [id number] Date of Birth/Sex: 1950/05/27 (69 y.o. F) Treating RN: Cornell Barman Primary Care Provider: [name] Other Clinician: Referring Provider: [name] Treating Provider/Extender: [name] in Treatment: 18 Debridement Performed for Wound #12 Left Calcaneus Assessment: Performed By: Physician Tommie Sams., PA-C Debridement Type: Debridement Level of Consciousness (Pre- Awake and Alert procedure): Pre-procedure Verification/Time Out Yes - 15:11 Taken: Total Area Debrided (L x W): 1.9 (cm) x 2 (cm) = 3.8 (cm) Tissue and other material Viable, Non-Viable, Slough, Subcutaneous, Slough debrided: Level: Skin/Subcutaneous Tissue Debridement Description: Excisional Instrument: Curette Bleeding: Minimum Hemostasis Achieved: Pressure Response to Treatment: Procedure was tolerated well Level of Consciousness (Post- Awake and Alert procedure): Post Debridement Measurements of Total Wound Length: (cm) 1.9 Stage: Category/Stage III Width: (cm) 2 Depth: (cm)  0.3 Volume: (cm) 0.895 Character of Wound/Ulcer Post Debridement: Stable Post Procedure Diagnosis Same as Pre-procedure Electronic Signature(s) Signed: 11/27/2020 4:30:46 PM By: Worthy Keeler PA-C Signed: 11/29/2020 5:43:31 PM By: Gretta Cool, BSN, RN, CWS, Kim RN, BSN Entered By: Gretta Cool, BSN, RN, CWS, Kim on 11/27/2020 15:11:54 [name] -------------------------------------------------------------------------------- HPI Details Patient Name: [name] Date of Service: 11/27/2020 2:45 PM Medical Record Number: [id number] Patient Account Number: [id number] Date of Birth/Sex: [birth year] (69 y.o. F) Treating RN: [name] Primary Care Provider: [name] Other Clinician: Referring Provider: [name] Treating Provider/Extender: [name] in Treatment: 18 History of Present Illness HPI Description: 70 year old with a past medical history significant for [name], urinary incontinence, and obesity. She has been seen in the wound clinic before for lower extremity ulcerations treated with compression therapy. she is also known to have hypertension, peripheral vascular disease, COPD, obstructive sleep apnea, lumbar radiculopathy, kyphoscoliosis, urinary issues and tobacco abuse. Smokes a packet of cigarettes a day was recently seen at the [facility] for swelling of her legs and feet with a ulceration on the dorsum of the right foot which has been there for about 6 months. she was recently in the ER about a month ago where she was seen for shortness of breath and swelling of the legs and a chest x-ray was within normal limits had an increase in her BNP and was given Lasix and put on doxycycline for a mild cellulitis and possible UTI. Wounds aresmaller today 11/13/15. Debrided and will continue Santyl. 12/21/2015 -- she was admitted to the hospital overnight on 12/18/2015 and diagnosed with urinary retention and cellulitis of the left lower  leg. is past to take clindamycin and use Santyl for her wounds. 01/15/2016 -- come back to see [place] for almost a month and continues to be noncompliant with her dressings 01/30/16 patient presents today for a follow-up visit concerning her ongoing bilateral lower extremity wounds. We have not seen her for the past 2 weeks although we are supposed to be seen her  for weekly visits. She currently has a Foley catheter. She tells me that the wounds are intensely painful especially with pressure and palpation at this point in time. Fortunately she is having no interval signs or symptoms of systemic infection but unfortunately the wounds have not improved dramatically since we last saw her. She does have home health coming to take care of her as well. She is currently not in any compression wraps. 02/06/16 ON evaluation today patient continues to experience discomfort regarding her bilateral lower extremity ulcers. She has continued to tolerate the dressing changes at this point in time and continues to have a Foley catheter as well. Fortunately she is back this week in the past she has been somewhat noncompliant with follow-ups I'm glad to see her today. She tells me that her pain level varies but can be as high as a 7 out of 10 with manipulation of the wound. She tells me that she used to be on oxycodone which was managed by the pain clinic although she is no longer on that as she tells me that she was actually smoking marijuana at the time and when this was found out they discontinued her pain medication. She no longer is taking anything pain medication wise and she also does not smoke cigarettes nor marijuana at this point. 02/12/16; this is a patient I don't believe I have previously seen. She has multiple sclerosis. She has 4 punched-out areas on the anterior lateral left leg 2 areas on the right these are all in the same condition. Reasonably small [dime size] wounds each was some depth. Surface of  these wounds does not look particularly healthy as there is adherent slough. There is no evidence of surrounding infection or inflammation. ABIs in this clinic were 0.87 on the right and 0.81 on the left. She is listed as having PAD and is a smoker. Not a diabetic 02/20/16 patient I gave antibiotics to last week for erythema around both wound sites on the left lateral leg and right lateral leg. This appears to be a lot better. One of the areas on the left leg is healed however she still has 3 punched-out open areas on the left lateral calf and one on the right lateral calf and one on the dorsal foot. She is an ex-smoker quitting 1 month ago. ABIs in this clinic were 0.87 on the right 0.81 on the left 03/12/16; this is a patient I really don't have a good sense of. It would appear for the first 5 or 6 weeks of her stay in this clinic she was cared for by Dr. Con Memos. She appeared on my schedule in mid October and I saw her twice. She has not been here however in 3 weeks. She has advanced Wound Care at home where she lives with her husband. She has multiple sclerosis. I saw her the first time she had 4 punched-out small wounds on the anterior lateral left leg it appears that she is now down to 2. She also had wounds on the lateral and medial aspect of her right leg which were also small and punched out. The only one that remains is on the right lateral. Because of the nature of her wound I went ahead and ordered formal arterial studies this showed an ABI of 1 on the right and one on the left. TBI of 1.4 on the right and 0.79 on the left. She had normal waveforms. She was in the emergency room on 02/28/16; there is a  noted edema of her left leg after a fall. She had a duplex ultrasound that showed no evidence for an acute DVT from the left groin to the popliteal fossa. The study was limited in the calf veins due to edema. Also noticeable that she had a left inguinal enlarged lymph node up to 6.2 cm. She  also had a left knee x-ray that showed no acute findings and a right foot x-ray that showed soft tissue swelling but no radiographic evidence of osteomyelitis. The patient does stop smoking 03-19-16 [name] presents today for evaluation of her bilateral lower extremity ulcerations. She states that she has not smoked in several weeks. She denies any pain or discomfort to bilateral lower extremities, tolerating compression therapy as ordered. 03/26/16; I have not seen this patient in 2 weeks however she has done very well with improvements in the areas in question. After we obtain normal arterial studies, increasing the 4-layer compression really seems to look done a nice job here. She has one open area on the left lateral leg and one on the medial right leg. These have filled in nicely and are now superficial wounds that showed epithelialized. I note the left inguinal lymph node at 6.2 cm. I may need to refer this back to the patient's primary doctor. 04/03/16; patient has 2 remaining wounds 1 on the left lateral leg and one on the right medial leg. Both of filled in nicely since we are able to increase her from a 3 layer to a 4 layer compression. I am also following up on the lymph node notable on her left leg DVT rule out in November 04/10/16; area on the right lateral leg is healed. The area on the left leg is still open but looks considerably better with healthy granulation and less wound area. I12/20/17; last week we healed the patient doubt with regards to the wound on the right leg to her on compression stocking 20-30 mmHg as it turns out I don't think she had a stocking, predictably therefore she has reopened. The left leg as well as the wound on the lateral aspect. Been generally small line 04/24/16; we healed out her right leg 2 weeks ago to her own 20-30 mm compression stockings although as it turns out she didn't have these and did not purchase some apparently because of financial issues.  The left leg wound is on the lateral aspect. Both of these wounds are just about healed. 05/01/16; her wounds on the left leg are healed out today. The area on the right leg is also healed. Vitamin diligent effort of our staff we have not been able to get any form of compression stocking to this patient. The orders for juxtalite's were given to home health this never materialized. We ordered compression stockings I don't think she was able to afford these. We had a discussion today with both the patient and her husband without these, will accumulate edema and the wounds will simply reopen again. 05/14/16 READMISSION [name], [name] ([id number]) this is a patient we healed out 2 weeks ago. As bilateral lower extremity venous wounds probably some degree of lymphedema. At the beginning in November I did a duplex ultrasound of her left leg that was negative for DVT but did show a lymph node in the left inguinal area presumably reactive. We've been using compression to her lower extremities eventually healed all her wounds on her bilateral calves but we were not able to get any form of compression stockings for her  in spite of intense efforts of our staff. She returns today with reopening on the left leg did not the right she has several superficial areas anteriorly but an actual frank ulcer on the lateral left calf. This is about the size of a dime or smaller. She does not really complain of pain. The patient has a history of PAD however arterial studies we did in November were normal. Her ABIs and Doppler waveforms were both within the normal limits. The patient has a history of [name]. In discussing things with her today it would appear that the opening was noted 2 weeks ago according to the patient. We gave her Tubigrip stockings when she left the clinic 05/21/16; she has not yet had the duplex ultrasound of her thigh, this is booked for Thursday. The wound on the left lateral lower leg is improved 05/28/16;  her duplex ultrasound of the left leg specifically her left thigh did not show a DVT however did show hypoechogenic enlarged left inguinal lymph nodes up to 6.9 x 3.7 x 6.8. This also showed in the one in November however maximal diameter was 6.2, at that point I thought these were reactive secondary to cellulitis however there is obviously a differential here. Her wound on the left lower leg is closed. She has a superficial open area on the base of her left fifth metatarsal head on the plantar foot. It looks as though she has some wrap injuries on the anterior leg 06/05/16; since she was last year she had a fall on 1/31. She was seen in the ER but sent back home. I think she is also been seen by her primary physician who picked up on the swelling and the lymphadenopathy. She is ordered a CT scan of the abdomen and pelvis however this will be without contrast because of stage IIIB renal insufficiency. Nevertheless this should be a start the workup to make sure there is not is more systemic problem here. She will probably need consideration of a biopsy of the lymph node in her inguinal area, this would need general surgery. As far as her wounds are concern today the area on the left plantar foot is healed. She has a small weeping area on the left lower leg. The wrap injury from last week has largely Pardoxically there is a lot less edema in the left thigh. 06/12/16; CT scan is on Friday. She has 2 small open areas one on the lateral left lower leg and one on the anterior lower leg on the left. Both of these looks healthy. I reduced her compression last week to Kerlix and Coban this seems to have not a reasonable job 07/02/16; the patient is now in the skilled facility. I think this was arranged out of the ER when she fell at home although I'm not exactly sure. She is going for I think a laparotomy for the pelvic mass next week. All the wounds in the left leg have healed Readmission: 07/20/2020 upon  evaluation today patient presents for reevaluation here in clinic unfortunately she is having issues with a significant stage III pressure ulcer to her left heel. This is something that occurred when she was in the hospital sick. And subsequently though she does not appear to have any new injury she is still trying to recover from the pressure injury which was quite severe during that time. I do not see any signs of infection right now which is great news. With that being said she has been require some  debridement to try to clear this up as much as possible. We need to get the wound VAC cleared so that she can see overall healing and improvement that were looking for. The patient does have a history of hypertension, chronic kidney disease stage III, COPD, and multiple sclerosis. Currently she has been trying to do what she can to keep pressure off of her heel while in bed and in other locations as well. 08/04/20 upon evaluation today patient appears to be doing extremely well currently in regard to her wound on the heel. I do believe the Iodoflex has helped loosen things appear. Unfortunately she tells me that the home health nurse has not been putting the Iodoflex on the wound. That is news to [place] and that they tell me they got supplies in the mail but they did not see the Iodoflex being used. Nonetheless I am wondering if this is just something that they missed or if it truly has not been. I did tell them to look when they get home in their box of supplies to see if they saw the dressing which showed and what it should look like. 08/11/2020 upon evaluation today patient appears to be doing decently well in regard to her wound although unfortunately this is showing signs of some blue-green drainage consistent with Pseudomonas it does have a little worried in this regard. Fortunately there is no evidence of active infection which is great news. No fevers, chills, nausea, vomiting, or diarrhea. 08/18/2020  upon evaluation today patient appears to be doing well with regard to her wound. She has been tolerating the dressing changes without complication. Fortunately there is no signs of active infection at this time. No fevers, chills, nausea, vomiting, or diarrhea. 4/29; left heel wound pressure ulcer. Slightly smaller. 09/04/2020 upon evaluation today patient appears to be doing well with regard to her wound. This is actually on her heel and seems to be doing well other than the fact that is a little bit moist from excessive drainage. There does not appear to be any signs of active infection at this time. No fevers, chills, nausea, vomiting, or diarrhea.. 09/11/2020 upon evaluation today patient appears to be doing okay in regard to her wound. With that being said she still has quite a bit of drainage noted at this point. Fortunately there is no signs of active infection which is great news overall I am extremely pleased with where things stand today. I do think we need to try to do something a little bit more to help control the drainage a bit better. 5/23; left posterior heel. This actually looks quite good. Using Hydrofera Blue under compression. Dimensions are smaller 09/26/2020 upon evaluation today patient appears to be doing well with regard to her heel ulcer. In fact this is showing signs of excellent improvement and overall I am extremely pleased with where things stand today. I do not see any evidence of infection which is great news and overall I feel like she is headed in the correct direction. 10/09/2020 upon evaluation today patient appears to be doing excellent in regard to her wound. She has been tolerating the dressing changes without complication. Fortunately there is no evidence of active infection which is also excellent news. No fevers, chills, nausea, vomiting, or diarrhea. 10/16/2020 upon evaluation today patient appears to be doing well with regard to her heel ulcer. Unfortunately she  is having issues with an area of deep tissue injury/pressure injury over the shin due to the wrap being  on way too tight today. Fortunately there does not appear to be any signs of infection currently. No fevers, chills, nausea, vomiting, or diarrhea. 10/23/2020 upon evaluation today patient appears to be doing better in regard to her wound. Fortunately there does not appear to be any signs of active infection at this time which is great news. In general I am extremely pleased with where things stand at this point. The patient does not show any signs of worsening which is excellent. She unfortunately still has the area in question with regard to the region over the anterior portion of her shin where she did have some deep tissue injury. Some of this is starting to open up a little bit. With that being said I think the biggest thing we need to do at this point is to try to as much as possible monitor for any additional breakdown and protect the region while it heals up. I think it is actually doing quite well which is great news. Fortunately there does not appear to be any evidence of active infection 11/14/2018 upon evaluation today patient appears to be doing okay in regard to her ischial. Her leg is much more swollen though she has not had a wrap on any type of dressing for some time when home health came out they did not have the Tubigrip. We have not actually seen her since June 27. The patient when I asked her about it seemed to get somewhat upset to be honest. She seems to be having some trouble at home. She states that she had to "raise hell" just to be able to get here to this appointment today. With that being said I really do think she needs to have [name], [name] H. ([id number]) this wraps and keep the swelling under control and was doing a whole lot better but now seems to be not doing quite as well to be honest. The anterior portion of her shin does seem to be healing and doing better although  it did end up opening into the wound. Again that was where home health the wrap does have a little bit too tight causing this issue previously. 11/27/2020 upon evaluation today patient appears to be doing poorly in regard to her anterior shin where she did have some area where the wrap actually aggravated the spot. With that being said I think that we do need to make sure to pad this extremely well there is already some signs of healing at this point. I did believe this was probably can open even when I saw it last week and to be honest it has opened. The heel also is showing signs of improvement I do see some slough noted but nothing too significant which is great news. No fevers, chills, nausea, vomiting, or diarrhea. Electronic Signature(s) Signed: 11/27/2020 3:20:21 PM By: Worthy Keeler PA-C Entered By: Worthy Keeler on 11/27/2020 15:20:21 [name] -------------------------------------------------------------------------------- Physical Exam Details Patient Name: [name] Date of Service: 11/27/2020 2:45 PM Medical Record Number: [id number] Patient Account Number: [id number] Date of Birth/Sex: [birth year] (69 y.o. F) Treating RN: [name] Primary Care Provider: [name] Other Clinician: Referring Provider: [name] Treating Provider/Extender: [name] in Treatment: 51 Constitutional Well-nourished and well-hydrated in no acute distress. Respiratory normal breathing without difficulty. Psychiatric this patient is able to make decisions and demonstrates good insight into disease process. Alert and Oriented x 3. pleasant and cooperative. Notes Upon inspection patient's wound bed showed signs of good  granulation epithelization at this point. There does not appear to be any evidence of infection currently which is great news and overall I am extremely pleased with where things stand at this point.I did perform sharp  debridement clear away some of the necrotic debris on the surface of the wound and patient tolerated that today without complication postdebridement the wound bed appears to be doing much better. Electronic Signature(s) Signed: 11/27/2020 3:20:51 PM By: Worthy Keeler PA-C Entered By: Worthy Keeler on 11/27/2020 15:20:51 [name] ([id number]) -------------------------------------------------------------------------------- Physician Orders Details Patient Name: [name] Date of Service: 11/27/2020 2:45 PM Medical Record Number: [id number] Patient Account Number: [id number] Date of Birth/Sex: 1951/01/24 (69 y.o. F) Treating RN: Cornell Barman Primary Care Provider: [name] Other Clinician: Referring Provider: [name] Treating Provider/Extender: [name] in Treatment: 18 Verbal / Phone Orders: No Diagnosis Coding ICD-10 Coding Code Description [phone] Pressure ulcer of left heel, stage 3 I10 Essential (primary) hypertension N18.30 Chronic kidney disease, stage 3 unspecified J44.9 Chronic obstructive pulmonary disease, unspecified G35 Multiple sclerosis Follow-up Appointments o Return Appointment in 2 weeks. [place]: - [facility] [place] for wound care. May utilize formulary equivalent dressing for wound treatment orders unless otherwise specified. Home Health Nurse may visit PRN to address patientos wound care needs. o Scheduled days for dressing changes to be completed; exception, patient has scheduled wound care visit that day. - Monday visits to Wound care center o **Please direct any NON-WOUND related issues/requests for orders to patient's Primary Care Physician. **If current dressing causes regression in wound condition, may D/C ordered dressing product/s and apply Normal Saline Moist Dressing daily until next [place] or Other MD appointment. **Notify Wound Healing Center of  regression in wound condition at [phone]. Edema Control - Lymphedema / Segmental Compressive Device / Other Bilateral Lower Extremities o Elevate, Exercise Daily and Avoid Standing for Long Periods of Time. o Elevate legs to the level of the heart and pump ankles as often as possible - ELEVATE LEGS o Elevate leg(s) parallel to the floor when sitting. Off-Loading Left Lower Extremity o Open toe surgical shoe o Turn and reposition every 2 hours o Other: - Keep pressure off left heel Wound Treatment Wound #12 - Calcaneus Wound Laterality: Left Cleanser: Soap and Water 2 x Per Week/30 Days Discharge Instructions: Gently cleanse wound with antibacterial soap, rinse and pat dry prior to dressing wounds Peri-Wound Care: Desitin Maximum Strength Ointment 4 (oz) 2 x Per Week/30 Days Discharge Instructions: Apply periwound for protection Primary Dressing: Hydrofera Blue Ready Transfer Foam, 4x5 (in/in) (Generic) 2 x Per Week/30 Days Discharge Instructions: Apply to wound-ensure hydrofera blue touches wound bed Secondary Dressing: ABD Pad 5x9 (in/in) 2 x Per Week/30 Days Discharge Instructions: Apply to secure hydrofera blue AND on shin area for protection Compression Wrap: Profore Lite LF 3 Multilayer Compression Bandaging System 2 x Per Week/30 Days Discharge Instructions: Apply 3 multi-layer wrap as prescribed. Electronic Signature(s) [name], [name] ([id number]) Signed: 11/27/2020 4:30:46 PM By: Worthy Keeler PA-C Signed: 11/29/2020 5:43:31 PM By: Gretta Cool, BSN, RN, CWS, Kim RN, BSN Entered By: Gretta Cool, BSN, RN, CWS, Kim on 11/27/2020 15:13:14 [name], [name] ([id number]) -------------------------------------------------------------------------------- Problem List Details Patient Name: [name] Date of Service: 11/27/2020 2:45 PM Medical Record Number: [id number] Patient Account Number: [id number] Date of Birth/Sex: 06/11/1950 (69 y.o. F) Treating RN: [name] Primary  Care Provider: [name] Other Clinician: Referring Provider: [name]  Treating Provider/Extender: [name] Weeks in Treatment: 18 Active Problems ICD-10 Encounter Code Description Active Date MDM Diagnosis L89.623 Pressure ulcer of left heel, stage 3 07/20/2020 No Yes L97.822 Non-pressure chronic ulcer of other part of left lower leg with fat layer 11/27/2020 No Yes exposed [place]ena (primary) hypertension 07/20/2020 No Yes N18.30 Chronic kidney disease, stage 3 unspecified 07/20/2020 No Yes J44.9 Chronic obstructive pulmonary disease, unspecified 07/20/2020 No Yes G35 Multiple sclerosis 07/20/2020 No Yes Inactive Problems Resolved Problems Electronic Signature(s) Signed: 11/27/2020 3:22:08 PM By: Worthy Keeler PA-C Previous Signature: 11/27/2020 3:07:14 PM Version By: Worthy Keeler PA-C Entered By: Worthy Keeler on 11/27/2020 15:22:08 [name] ([id number]) -------------------------------------------------------------------------------- Progress Note Details Patient Name: [name] Date of Service: 11/27/2020 2:45 PM Medical Record Number: [id number] Patient Account Number: [id number] Date of Birth/Sex: 1950/08/30 (69 y.o. F) Treating RN: [name] Primary Care Provider: [name] Other Clinician: Referring Provider: [name] Treating Provider/Extender: [name] in Treatment: 18 Subjective Chief Complaint Information obtained from Patient Left Heel Pressure Ulcer History of Present Illness (HPI) 70 year old with a past medical history significant for [name], urinary incontinence, and obesity. She has been seen in the wound clinic before for lower extremity ulcerations treated with compression therapy. she is also known to have hypertension, peripheral vascular disease, COPD, obstructive sleep apnea, lumbar radiculopathy, kyphoscoliosis, urinary issues and tobacco abuse. Smokes a packet of cigarettes a day was recently  seen at the [facility] for swelling of her legs and feet with a ulceration on the dorsum of the right foot which has been there for about 6 months. she was recently in the ER about a month ago where she was seen for shortness of breath and swelling of the legs and a chest x-ray was within normal limits had an increase in her BNP and was given Lasix and put on doxycycline for a mild cellulitis and possible UTI. Wounds aresmaller today 11/13/15. Debrided and will continue Santyl. 12/21/2015 -- she was admitted to the hospital overnight on 12/18/2015 and diagnosed with urinary retention and cellulitis of the left lower leg. is past to take clindamycin and use Santyl for her wounds. 01/15/2016 -- come back to see [place] for almost a month and continues to be noncompliant with her dressings 01/30/16 patient presents today for a follow-up visit concerning her ongoing bilateral lower extremity wounds. We have not seen her for the past 2 weeks although we are supposed to be seen her for weekly visits. She currently has a Foley catheter. She tells me that the wounds are intensely painful especially with pressure and palpation at this point in time. Fortunately she is having no interval signs or symptoms of systemic infection but unfortunately the wounds have not improved dramatically since we last saw her. She does have home health coming to take care of her as well. She is currently not in any compression wraps. 02/06/16 ON evaluation today patient continues to experience discomfort regarding her bilateral lower extremity ulcers. She has continued to tolerate the dressing changes at this point in time and continues to have a Foley catheter as well. Fortunately she is back this week in the past she has been somewhat noncompliant with follow-ups I'm glad to see her today. She tells me that her pain level varies but can be as high as a 7 out of 10 with manipulation of the wound. She tells me that she  used to be on oxycodone which was managed by the pain  clinic although she is no longer on that as she tells me that she was actually smoking marijuana at the time and when this was found out they discontinued her pain medication. She no longer is taking anything pain medication wise and she also does not smoke cigarettes nor marijuana at this point. 02/12/16; this is a patient I don't believe I have previously seen. She has multiple sclerosis. She has 4 punched-out areas on the anterior lateral left leg 2 areas on the right these are all in the same condition. Reasonably small [dime size] wounds each was some depth. Surface of these wounds does not look particularly healthy as there is adherent slough. There is no evidence of surrounding infection or inflammation. ABIs in this clinic were 0.87 on the right and 0.81 on the left. She is listed as having PAD and is a smoker. Not a diabetic 02/20/16 patient I gave antibiotics to last week for erythema around both wound sites on the left lateral leg and right lateral leg. This appears to be a lot better. One of the areas on the left leg is healed however she still has 3 punched-out open areas on the left lateral calf and one on the right lateral calf and one on the dorsal foot. She is an ex-smoker quitting 1 month ago. ABIs in this clinic were 0.87 on the right 0.81 on the left 03/12/16; this is a patient I really don't have a good sense of. It would appear for the first 5 or 6 weeks of her stay in this clinic she was cared for by Dr. Con Memos. She appeared on my schedule in mid October and I saw her twice. She has not been here however in 3 weeks. She has advanced Wound Care at home where she lives with her husband. She has multiple sclerosis. I saw her the first time she had 4 punched-out small wounds on the anterior lateral left leg it appears that she is now down to 2. She also had wounds on the lateral and medial aspect of her right leg which were also  small and punched out. The only one that remains is on the right lateral. Because of the nature of her wound I went ahead and ordered formal arterial studies this showed an ABI of 1 on the right and one on the left. TBI of 1.4 on the right and 0.79 on the left. She had normal waveforms. She was in the emergency room on 02/28/16; there is a noted edema of her left leg after a fall. She had a duplex ultrasound that showed no evidence for an acute DVT from the left groin to the popliteal fossa. The study was limited in the calf veins due to edema. Also noticeable that she had a left inguinal enlarged lymph node up to 6.2 cm. She also had a left knee x-ray that showed no acute findings and a right foot x-ray that showed soft tissue swelling but no radiographic evidence of osteomyelitis. The patient does stop smoking 03-19-16 [name] presents today for evaluation of her bilateral lower extremity ulcerations. She states that she has not smoked in several weeks. She denies any pain or discomfort to bilateral lower extremities, tolerating compression therapy as ordered. 03/26/16; I have not seen this patient in 2 weeks however she has done very well with improvements in the areas in question. After we obtain normal arterial studies, increasing the 4-layer compression really seems to look done a nice job here. She has one open  area on the left lateral leg and one on the medial right leg. These have filled in nicely and are now superficial wounds that showed epithelialized. I note the left inguinal lymph node at 6.2 cm. I may need to refer this back to the patient's primary doctor. 04/03/16; patient has 2 remaining wounds 1 on the left lateral leg and one on the right medial leg. Both of filled in nicely since we are able to increase her from a 3 layer to a 4 layer compression. I am also following up on the lymph node notable on her left leg DVT rule out in November 04/10/16; area on the right lateral leg is  healed. The area on the left leg is still open but looks considerably better with healthy granulation and less wound area. I12/20/17; last week we healed the patient doubt with regards to the wound on the right leg to her on compression stocking 20-30 mmHg as it turns out I don't think she had a stocking, predictably therefore she has reopened. The left leg as well as the wound on the lateral aspect. Been generally small line 04/24/16; we healed out her right leg 2 weeks ago to her own 20-30 mm compression stockings although as it turns out she didn't have these and did not purchase some apparently because of financial issues. The left leg wound is on the lateral aspect. Both of these wounds are just about healed. 05/01/16; her wounds on the left leg are healed out today. The area on the right leg is also healed. Vitamin diligent effort of our staff we have not been able to get any form of compression stocking to this patient. The orders for juxtalite's were given to home health this never materialized. We [name], [name] H. ([id number]) ordered compression stockings I don't think she was able to afford these. We had a discussion today with both the patient and her husband without these, will accumulate edema and the wounds will simply reopen again. 05/14/16 READMISSION this is a patient we healed out 2 weeks ago. As bilateral lower extremity venous wounds probably some degree of lymphedema. At the beginning in November I did a duplex ultrasound of her left leg that was negative for DVT but did show a lymph node in the left inguinal area presumably reactive. We've been using compression to her lower extremities eventually healed all her wounds on her bilateral calves but we were not able to get any form of compression stockings for her in spite of intense efforts of our staff. She returns today with reopening on the left leg did not the right she has several superficial areas anteriorly but an actual  frank ulcer on the lateral left calf. This is about the size of a dime or smaller. She does not really complain of pain. The patient has a history of PAD however arterial studies we did in November were normal. Her ABIs and Doppler waveforms were both within the normal limits. The patient has a history of [name]. In discussing things with her today it would appear that the opening was noted 2 weeks ago according to the patient. We gave her Tubigrip stockings when she left the clinic 05/21/16; she has not yet had the duplex ultrasound of her thigh, this is booked for Thursday. The wound on the left lateral lower leg is improved 05/28/16; her duplex ultrasound of the left leg specifically her left thigh did not show a DVT however did show hypoechogenic enlarged left inguinal lymph nodes up  to 6.9 x 3.7 x 6.8. This also showed in the one in November however maximal diameter was 6.2, at that point I thought these were reactive secondary to cellulitis however there is obviously a differential here. Her wound on the left lower leg is closed. She has a superficial open area on the base of her left fifth metatarsal head on the plantar foot. It looks as though she has some wrap injuries on the anterior leg 06/05/16; since she was last year she had a fall on 1/31. She was seen in the ER but sent back home. I think she is also been seen by her primary physician who picked up on the swelling and the lymphadenopathy. She is ordered a CT scan of the abdomen and pelvis however this will be without contrast because of stage IIIB renal insufficiency. Nevertheless this should be a start the workup to make sure there is not is more systemic problem here. She will probably need consideration of a biopsy of the lymph node in her inguinal area, this would need general surgery. As far as her wounds are concern today the area on the left plantar foot is healed. She has a small weeping area on the left lower leg. The wrap injury from  last week has largely Pardoxically there is a lot less edema in the left thigh. 06/12/16; CT scan is on Friday. She has 2 small open areas one on the lateral left lower leg and one on the anterior lower leg on the left. Both of these looks healthy. I reduced her compression last week to Kerlix and Coban this seems to have not a reasonable job 07/02/16; the patient is now in the skilled facility. I think this was arranged out of the ER when she fell at home although I'm not exactly sure. She is going for I think a laparotomy for the pelvic mass next week. All the wounds in the left leg have healed Readmission: 07/20/2020 upon evaluation today patient presents for reevaluation here in clinic unfortunately she is having issues with a significant stage III pressure ulcer to her left heel. This is something that occurred when she was in the hospital sick. And subsequently though she does not appear to have any new injury she is still trying to recover from the pressure injury which was quite severe during that time. I do not see any signs of infection right now which is great news. With that being said she has been require some debridement to try to clear this up as much as possible. We need to get the wound VAC cleared so that she can see overall healing and improvement that were looking for. The patient does have a history of hypertension, chronic kidney disease stage III, COPD, and multiple sclerosis. Currently she has been trying to do what she can to keep pressure off of her heel while in bed and in other locations as well. 08/04/20 upon evaluation today patient appears to be doing extremely well currently in regard to her wound on the heel. I do believe the Iodoflex has helped loosen things appear. Unfortunately she tells me that the home health nurse has not been putting the Iodoflex on the wound. That is news to [place] and that they tell me they got supplies in the mail but they did not see the Iodoflex  being used. Nonetheless I am wondering if this is just something that they missed or if it truly has not been. I did tell them to look  when they get home in their box of supplies to see if they saw the dressing which showed and what it should look like. 08/11/2020 upon evaluation today patient appears to be doing decently well in regard to her wound although unfortunately this is showing signs of some blue-green drainage consistent with Pseudomonas it does have a little worried in this regard. Fortunately there is no evidence of active infection which is great news. No fevers, chills, nausea, vomiting, or diarrhea. 08/18/2020 upon evaluation today patient appears to be doing well with regard to her wound. She has been tolerating the dressing changes without complication. Fortunately there is no signs of active infection at this time. No fevers, chills, nausea, vomiting, or diarrhea. 4/29; left heel wound pressure ulcer. Slightly smaller. 09/04/2020 upon evaluation today patient appears to be doing well with regard to her wound. This is actually on her heel and seems to be doing well other than the fact that is a little bit moist from excessive drainage. There does not appear to be any signs of active infection at this time. No fevers, chills, nausea, vomiting, or diarrhea.. 09/11/2020 upon evaluation today patient appears to be doing okay in regard to her wound. With that being said she still has quite a bit of drainage noted at this point. Fortunately there is no signs of active infection which is great news overall I am extremely pleased with where things stand today. I do think we need to try to do something a little bit more to help control the drainage a bit better. 5/23; left posterior heel. This actually looks quite good. Using Hydrofera Blue under compression. Dimensions are smaller 09/26/2020 upon evaluation today patient appears to be doing well with regard to her heel ulcer. In fact this is  showing signs of excellent improvement and overall I am extremely pleased with where things stand today. I do not see any evidence of infection which is great news and overall I feel like she is headed in the correct direction. 10/09/2020 upon evaluation today patient appears to be doing excellent in regard to her wound. She has been tolerating the dressing changes without complication. Fortunately there is no evidence of active infection which is also excellent news. No fevers, chills, nausea, vomiting, or diarrhea. 10/16/2020 upon evaluation today patient appears to be doing well with regard to her heel ulcer. Unfortunately she is having issues with an area of deep tissue injury/pressure injury over the shin due to the wrap being on way too tight today. Fortunately there does not appear to be any signs of infection currently. No fevers, chills, nausea, vomiting, or diarrhea. 10/23/2020 upon evaluation today patient appears to be doing better in regard to her wound. Fortunately there does not appear to be any signs of active infection at this time which is great news. In general I am extremely pleased with where things stand at this point. The patient does not show any signs of worsening which is excellent. She unfortunately still has the area in question with regard to the region over the anterior portion of her shin where she did have some deep tissue injury. Some of this is starting to open up a little bit. With that being said I think the biggest thing we need to do at this point is to try to as much as possible monitor for any additional breakdown and protect the region while it heals up. I think it is actually doing quite well which is great news. Fortunately there does  not appear to be any evidence of active infection MALONNI, [name] 11/14/2018 upon evaluation today patient appears to be doing okay in regard to her ischial. Her leg is much more swollen though she has not had a wrap  on any type of dressing for some time when home health came out they did not have the Tubigrip. We have not actually seen her since June 27. The patient when I asked her about it seemed to get somewhat upset to be honest. She seems to be having some trouble at home. She states that she had to "raise hell" just to be able to get here to this appointment today. With that being said I really do think she needs to have this wraps and keep the swelling under control and was doing a whole lot better but now seems to be not doing quite as well to be honest. The anterior portion of her shin does seem to be healing and doing better although it did end up opening into the wound. Again that was where home health the wrap does have a little bit too tight causing this issue previously. 11/27/2020 upon evaluation today patient appears to be doing poorly in regard to her anterior shin where she did have some area where the wrap actually aggravated the spot. With that being said I think that we do need to make sure to pad this extremely well there is already some signs of healing at this point. I did believe this was probably can open even when I saw it last week and to be honest it has opened. The heel also is showing signs of improvement I do see some slough noted but nothing too significant which is great news. No fevers, chills, nausea, vomiting, or diarrhea. Objective Constitutional Well-nourished and well-hydrated in no acute distress. Vitals Time Taken: 2:54 PM, Height: 64 in, Weight: 265 lbs, BMI: 45.5, Temperature: 97.9 F, Pulse: 90 bpm, Respiratory Rate: 16 breaths/min, Blood Pressure: 115/69 mmHg. Respiratory normal breathing without difficulty. Psychiatric this patient is able to make decisions and demonstrates good insight into disease process. Alert and Oriented x 3. pleasant and cooperative. General Notes: Upon inspection patient's wound bed showed signs of good granulation epithelization at this  point. There does not appear to be any evidence of infection currently which is great news and overall I am extremely pleased with where things stand at this point.I did perform sharp debridement clear away some of the necrotic debris on the surface of the wound and patient tolerated that today without complication postdebridement the wound bed appears to be doing much better. Integumentary (Hair, Skin) Wound #12 status is Open. Original cause of wound was Pressure Injury. The date acquired was: 06/27/2020. The wound has been in treatment 18 weeks. The wound is located on the Left Calcaneus. The wound measures 1.9cm length x 2cm width x 0.2cm depth; 2.985cm^2 area and 0.597cm^3 volume. There is Fat Layer (Subcutaneous Tissue) exposed. There is no tunneling or undermining noted. There is a medium amount of serous drainage noted. The wound margin is thickened. There is medium (34-66%) red, pink granulation within the wound bed. There is a medium (34-66%) amount of necrotic tissue within the wound bed including Adherent Slough. General Notes: Dressing material stuck to the medial edge of the wound. Wound #13 status is Open. Original cause of wound was Shear/Friction. The date acquired was: 11/24/2020. The wound is located on the Left,Midline,Anterior Lower Leg. The wound measures 4cm length x 1.3cm width x  0.1cm depth; 4.084cm^2 area and 0.408cm^3 volume. There is Fat Layer (Subcutaneous Tissue) exposed. There is no tunneling or undermining noted. There is a medium amount of serous drainage noted. The wound margin is indistinct and nonvisible. There is medium (34-66%) granulation within the wound bed. There is a medium (34-66%) amount of necrotic tissue within the wound bed including Adherent Slough. Assessment Active Problems ICD-10 Pressure ulcer of left heel, stage 3 Non-pressure chronic ulcer of other part of left lower leg with fat layer exposed Essential (primary) hypertension Chronic kidney  disease, stage 3 unspecified Chronic obstructive pulmonary disease, unspecified Multiple sclerosis [name], [name] H. ([id number]) Procedures Wound #12 Pre-procedure diagnosis of Wound #12 is a Pressure Ulcer located on the Left Calcaneus . There was a Excisional Skin/Subcutaneous Tissue Debridement with a total area of 3.8 sq cm performed by Tommie Sams., PA-C. With the following instrument(s): Curette to remove Viable and Non-Viable tissue/material. Material removed includes Subcutaneous Tissue and Slough and. No specimens were taken. A time out was conducted at 15:11, prior to the start of the procedure. A Minimum amount of bleeding was controlled with Pressure. The procedure was tolerated well. Post Debridement Measurements: 1.9cm length x 2cm width x 0.3cm depth; 0.895cm^3 volume. Post debridement Stage noted as Category/Stage III. Character of Wound/Ulcer Post Debridement is stable. Post procedure Diagnosis Wound #12: Same as Pre-Procedure Plan Follow-up Appointments: Return Appointment in 2 weeks. Home Health: [facility] for wound care. May utilize formulary equivalent dressing for wound treatment orders unless otherwise specified. Home Health Nurse may visit [facility] to address patient s wound care needs. Scheduled days for dressing changes to be completed; exception, patient has scheduled wound care visit that day. - Monday visits to Wound care center **Please direct any NON-WOUND related issues/requests for orders to patient's Primary Care Physician. **If current dressing causes regression in wound condition, may D/C ordered dressing product/s and apply Normal [place] [facility]ist Dressing daily until next [place] or Other MD appointment. **Notify [facility] of regression in wound condition at [phone]. Edema Control - Lymphedema / Segmental Compressive Device / Other: Elevate, Exercise Daily and Avoid Standing for Long  Periods of Time. Elevate legs to the level of the heart and pump ankles as often as possible - ELEVATE LEGS Elevate leg(s) parallel to the floor when sitting. Off-Loading: Open toe surgical shoe Turn and reposition every 2 hours Other: - Keep pressure off left heel WOUND #12: - Calcaneus Wound Laterality: Left Cleanser: Soap and Water 2 x Per Week/30 Days Discharge Instructions: Gently cleanse wound with antibacterial soap, rinse and pat dry prior to dressing wounds Peri-Wound Care: Desitin Maximum Strength Ointment 4 (oz) 2 x Per Week/30 Days Discharge Instructions: Apply periwound for protection Primary Dressing: Hydrofera Blue Ready Transfer Foam, 4x5 (in/in) (Generic) 2 x Per Week/30 Days Discharge Instructions: Apply to wound-ensure hydrofera blue touches wound bed Secondary Dressing: ABD Pad 5x9 (in/in) 2 x Per Week/30 Days Discharge Instructions: Apply to secure hydrofera blue AND on shin area for protection Compression Wrap: Profore Lite LF 3 Multilayer Compression Bandaging System 2 x Per Week/30 Days Discharge Instructions: Apply 3 multi-layer wrap as prescribed. 1. Would recommend currently that we going to continue with wound care measures as before and the patient is in agreement with the plan. Fortunately I do not see any signs of anything worsening but nonetheless I do think that the [facility] is doing a good job for the heel we will use  that on the anterior shin as well. 2. I am also can recommend an ABD pad to cover and pad over the shin area and then subsequently we will use a 3 layer compression wrap which seems to be doing a good job. We will see patient back for reevaluation in 1 week here in the clinic. If anything worsens or changes patient will contact our office for additional recommendations. Electronic Signature(s) Signed: 11/27/2020 3:22:37 PM By: Worthy Keeler PA-C Previous Signature: 11/27/2020 3:21:24 PM Version By: Worthy Keeler PA-C Entered By: Worthy Keeler on 11/27/2020 15:22:37 [name] -------------------------------------------------------------------------------- SuperBill Details Patient Name: [name] Date of Service: 11/27/2020 Medical Record Number: [id number] Patient Account Number: [id number] Date of Birth/Sex: Feb 06, 1951 (69 y.o. F) Treating RN: [name] Primary Care Provider: [name] Other Clinician: Referring Provider: [name] Treating Provider/Extender: [name] in Treatment: 18 Diagnosis Coding ICD-10 Codes Code Description [phone] Pressure ulcer of left heel, stage 3 L97.822 Non-pressure chronic ulcer of other part of left lower leg with fat layer exposed I10 Essential (primary) hypertension N18.30 Chronic kidney disease, stage 3 unspecified J44.9 Chronic obstructive pulmonary disease, unspecified G35 Multiple sclerosis Facility Procedures CPT4 Code: [id number] Description: [id number] - DEB SUBQ TISSUE 20 SQ CM/< Modifier: Quantity: 1 CPT4 Code: Description: ICD-10 Diagnosis Description L89.623 Pressure ulcer of left heel, stage 3 Modifier: Quantity: Physician Procedures CPT4 [id number] Description: [id number] - WC PHYS LEVEL 4 - EST PT Modifier: 25 Quantity: 1 CPT4 Code: Description: ICD-10 Diagnosis Description L89.623 Pressure ulcer of left heel, stage 3 L97.822 Non-pressure chronic ulcer of other part of left lower leg with fat layer I10 Essential (primary) hypertension N18.30 Chronic kidney disease, stage 3  unspecified Modifier: exposed Quantity: CPT4 Code: [id number] Description: 11042 - WC PHYS SUBQ TISS 20 SQ CM Modifier: Quantity: 1 CPT4 Code: Description: ICD-10 Diagnosis Description L89.623 Pressure ulcer of left heel, stage 3 Modifier: Quantity: Electronic Signature(s) Signed: 11/27/2020 3:23:15 PM By: Worthy Keeler PA-C Entered By: Worthy Keeler on 11/27/2020 15:23:14

## 2020-11-30 NOTE — Progress Notes (Signed)
[name], [name] ([id number]) Visit Report for 11/27/2020 Arrival Information Details Patient Name: [name], [name] Date of Service: 11/27/2020 2:45 PM Medical Record Number: [id number] Patient Account Number: [id number] Date of Birth/Sex: [birth year] (70 y.o. F) Treating RN: Cornell Barman Primary Care Bee Hammerschmidt: [name] Other Clinician: Referring Emiliano Welshans: [name] Treating Mariyah Upshaw/Extender: [name] in Treatment: 18 Visit Information History Since Last Visit Added or deleted any medications: No Patient Arrived: Wheel Chair Has Dressing in Place as Prescribed: Yes Arrival Time: 14:54 Has Compression in Place as Prescribed: Yes Accompanied By: husband Pain Present Now: No Transfer Assistance: Manual Patient Identification Verified: Yes Secondary Verification Process Completed: Yes Patient Requires Transmission-Based Precautions: No Patient Has Alerts: Yes Electronic Signature(s) Signed: 11/29/2020 5:43:31 PM By: Gretta Cool, BSN, RN, CWS, Kim RN, BSN Entered By: Gretta Cool, BSN, RN, CWS, Kim on 11/27/2020 14:54:34 [name] ([id number]) -------------------------------------------------------------------------------- Encounter Discharge Information Details Patient Name: [name] Date of Service: 11/27/2020 2:45 PM Medical Record Number: [id number] Patient Account Number: [id number] Date of Birth/Sex: 01-Feb-1951 (70 y.o. F) Treating RN: Cornell Barman Primary Care Nikita Humble: [name] Other Clinician: Referring Estephanie Hubbs: [name] Treating Rune Mendez/Extender: [name] in Treatment: 18 Encounter Discharge Information Items Post Procedure Vitals Discharge Condition: Stable Temperature (F): 98.0 Ambulatory Status: Ambulatory Pulse (bpm): 97 Discharge Destination: Home Respiratory Rate (breaths/min): 16 Transportation: Private Auto Blood Pressure (mmHg): 138/70 Accompanied By: husband Schedule Follow-up Appointment: Yes Clinical Summary of  Care: Electronic Signature(s) Signed: 11/27/2020 3:39:04 PM By: Gretta Cool, BSN, RN, CWS, Kim RN, BSN Entered By: Gretta Cool, BSN, RN, CWS, Kim on 11/27/2020 15:39:04 [name] ([id number]) -------------------------------------------------------------------------------- Lower Extremity Assessment Details Patient Name: [name] Date of Service: 11/27/2020 2:45 PM Medical Record Number: [id number] Patient Account Number: [id number] Date of Birth/Sex: 06-09-1950 (70 y.o. F) Treating RN: Cornell Barman Primary Care Tabor Bartram: [name] Other Clinician: Referring Ashlinn Hemrick: [name] Treating Shanta Dorvil/Extender: [name] in Treatment: 18 Vascular Assessment Pulses: Dorsalis Pedis Palpable: [Left:Yes] Electronic Signature(s) Signed: 11/29/2020 5:43:31 PM By: Gretta Cool, BSN, RN, CWS, Kim RN, BSN Entered By: Gretta Cool, BSN, RN, CWS, Kim on 11/27/2020 15:03:42 [name] ([id number]) -------------------------------------------------------------------------------- Multi Wound Chart Details Patient Name: [name] Date of Service: 11/27/2020 2:45 PM Medical Record Number: [id number] Patient Account Number: [id number] Date of Birth/Sex: October 20, 1950 (70 y.o. F) Treating RN: Cornell Barman Primary Care Hosey Burmester: [name] Other Clinician: Referring Hai Grabe: [name] Treating Jannette Cotham/Extender: [name] in Treatment: 18 Vital Signs Height(in): 22 Pulse(bpm): 28 Weight(lbs): 59 Blood Pressure(mmHg): 115/69 Body Mass Index(BMI): 45 Temperature(F): 97.9 Respiratory Rate(breaths/min): 16 Photos: [N/A:N/A] Wound Location: Left Calcaneus Left, Midline, Anterior Lower Leg N/A Wounding Event: Pressure Injury Shear/Friction N/A Primary Etiology: Pressure Ulcer Trauma, Other N/A Comorbid History: Cataracts, Chronic Obstructive Cataracts, Chronic Obstructive N/A Pulmonary Disease (COPD), Sleep Pulmonary Disease (COPD), Sleep Apnea, Hypertension, Apnea,  Hypertension, Osteoarthritis, Neuropathy Osteoarthritis, Neuropathy Date Acquired: 06/27/2020 11/24/2020 N/A Weeks of Treatment: 18 0 N/A Wound Status: Open Open N/A Measurements L x W x D (cm) 1.9x2x0.2 4x1.3x0.1 N/A Area (cm) : 2.985 4.084 N/A Volume (cm) : 0.597 0.408 N/A % Reduction in Area: 41.50% N/A N/A % Reduction in Volume: 41.50% N/A N/A Classification: Category/Stage III Partial Thickness N/A Exudate Amount: Medium Medium N/A Exudate Type: Serous Serous N/A Exudate Color: amber amber N/A Wound Margin: Thickened Indistinct, nonvisible N/A Granulation Amount: Medium (34-66%) Medium (34-66%) N/A Granulation Quality: Red, Pink N/A N/A Necrotic Amount: Medium (34-66%) Medium (34-66%) N/A Exposed Structures: Fat Layer (Subcutaneous Tissue): Fat Layer (Subcutaneous Tissue): N/A  Yes Yes Fascia: No Fascia: No Tendon: No Tendon: No Muscle: No Muscle: No Joint: No Joint: No Bone: No Bone: No Epithelialization: Small (1-33%) Large (67-100%) N/A Assessment Notes: Dressing material stuck to the N/A N/A medial edge of the wound. Treatment Notes Electronic Signature(s) Signed: 11/29/2020 5:43:31 PM By: Gretta Cool, BSN, RN, CWS, Kim RN, BSN Entered By: Gretta Cool, BSN, RN, CWS, Kim on 11/27/2020 15:08:47 NIOMA, MCCUBBINS ([id number]) SHAWONDA, KERCE ([id number]) -------------------------------------------------------------------------------- Multi-Disciplinary Care Plan Details Patient Name: [name] Date of Service: 11/27/2020 2:45 PM Medical Record Number: [id number] Patient Account Number: [id number] Date of Birth/Sex: [birth year] (70 y.o. F) Treating RN: Cornell Barman Primary Care Khy Pitre: [name] Other Clinician: Referring Bralin Garry: [name] Treating Adiva Boettner/Extender: [name] in Treatment: 18 Active Inactive Wound/Skin Impairment Nursing Diagnoses: Impaired tissue integrity Goals: Patient/caregiver will verbalize understanding of skin care  regimen Date Initiated: 07/20/2020 Date Inactivated: 09/04/2020 Target Resolution Date: 09/19/2020 Goal Status: Met Ulcer/skin breakdown will have a volume reduction of 30% by week 4 Date Initiated: 07/20/2020 Date Inactivated: 08/25/2020 Target Resolution Date: 08/20/2020 Goal Status: Met Ulcer/skin breakdown will have a volume reduction of 50% by week 8 Date Initiated: 07/20/2020 Date Inactivated: 10/09/2020 Target Resolution Date: 09/19/2020 Goal Status: Met Ulcer/skin breakdown will have a volume reduction of 80% by week 12 Date Initiated: 07/20/2020 Target Resolution Date: 10/20/2020 Goal Status: Active Ulcer/skin breakdown will heal within 14 weeks Date Initiated: 07/20/2020 Target Resolution Date: 11/19/2020 Goal Status: Active Interventions: Assess patient/caregiver ability to obtain necessary supplies Assess patient/caregiver ability to perform ulcer/skin care regimen upon admission and as needed Provide education on ulcer and skin care Treatment Activities: Referred to DME Smokey Melott for dressing supplies : 07/20/2020 Skin care regimen initiated : 07/20/2020 Notes: Electronic Signature(s) Signed: 11/29/2020 5:43:31 PM By: Gretta Cool, BSN, RN, CWS, Kim RN, BSN Entered By: Gretta Cool, BSN, RN, CWS, Kim on 11/27/2020 15:08:39 [name] ([id number]) -------------------------------------------------------------------------------- Pain Assessment Details Patient Name: [name] Date of Service: 11/27/2020 2:45 PM Medical Record Number: [id number] Patient Account Number: [id number] Date of Birth/Sex: [birth year] (70 y.o. F) Treating RN: Cornell Barman Primary Care Anthonee Gelin: [name] Other Clinician: Referring Johnjoseph Rolfe: [name] Treating Benjimin Hadden/Extender: [name] in Treatment: 18 Active Problems Location of Pain Severity and Description of Pain Patient Has Paino Yes Site Locations Rate the pain. Current Pain Level: 2 Pain Management and Medication Current Pain  Management: Electronic Signature(s) Signed: 11/29/2020 5:43:31 PM By: Gretta Cool, BSN, RN, CWS, Kim RN, BSN Entered By: Gretta Cool, BSN, RN, CWS, Kim on 11/27/2020 14:55:09 [name] ([id number]) -------------------------------------------------------------------------------- Patient/Caregiver Education Details Patient Name: [name] Date of Service: 11/27/2020 2:45 PM Medical Record Number: [id number] Patient Account Number: [id number] Date of Birth/Gender: Sep 26, 1950 (70 y.o. F) Treating RN: Cornell Barman Primary Care Physician: [name] Other Clinician: Referring Physician: [name] Treating Physician/Extender: [name] in Treatment: 18 Education Assessment Education Provided To: Patient Education Topics Provided Venous: Handouts: Controlling Swelling with Multilayered Compression Wraps, Other: leave wrap in place. Methods: Demonstration, Explain/Verbal Responses: State content correctly Wound/Skin Impairment: Handouts: Caring for Your Ulcer, Other: continue wound are as prescribed Methods: Demonstration, Explain/Verbal Responses: State content correctly Electronic Signature(s) Signed: 11/29/2020 5:43:31 PM By: Gretta Cool, BSN, RN, CWS, Kim RN, BSN Entered By: Gretta Cool, BSN, RN, CWS, Kim on 11/27/2020 15:37:25 [name] ([id number]) -------------------------------------------------------------------------------- Wound Assessment Details Patient Name: [name] Date of Service: 11/27/2020 2:45 PM Medical Record Number: [id number] Patient Account Number: [id number] Date of Birth/Sex: 30-Jan-1951 (70 y.o. F) Treating RN: Gretta Cool,  [place] Primary Care Ileanna Gemmill: [name] Other Clinician: Referring Miller Edgington: [name] Treating Twala Collings/Extender: [name] in Treatment: 18 Wound Status Wound Number: 12 Primary Pressure Ulcer Etiology: Wound Location: Left Calcaneus Wound Open Wounding Event: Pressure Injury Status: Date Acquired:  06/27/2020 Comorbid Cataracts, Chronic Obstructive Pulmonary Disease Weeks Of Treatment: 18 History: (COPD), Sleep Apnea, Hypertension, Osteoarthritis, Clustered Wound: No Neuropathy Photos Wound Measurements Length: (cm) 1.9 % Red Width: (cm) 2 % Red Depth: (cm) 0.2 Epith Area: (cm) 2.985 Tunn Volume: (cm) 0.597 Unde uction in Area: 41.5% uction in Volume: 41.5% elialization: Small (1-33%) eling: No rmining: No Wound Description Classification: Category/Stage III Foul Wound Margin: Thickened Slou Exudate Amount: Medium Exudate Type: Serous Exudate Color: amber Odor After Cleansing: No gh/Fibrino Yes Wound Bed Granulation Amount: Medium (34-66%) Exposed Structure Granulation Quality: Red, Pink Fascia Exposed: No Necrotic Amount: Medium (34-66%) Fat Layer (Subcutaneous Tissue) Exposed: Yes Necrotic Quality: Adherent Slough Tendon Exposed: No Muscle Exposed: No Joint Exposed: No Bone Exposed: No Assessment Notes Dressing material stuck to the medial edge of the wound. Treatment Notes Wound #12 (Calcaneus) Wound Laterality: Left Cleanser Soap and Water MARYANNA, STUBER ([id number]) Discharge Instruction: Gently cleanse wound with antibacterial soap, rinse and pat dry prior to dressing wounds Peri-Wound Care Desitin Maximum Strength Ointment 4 (oz) Discharge Instruction: Apply periwound for protection Topical Primary Dressing Hydrofera Blue Ready Transfer Foam, 4x5 (in/in) Discharge Instruction: Apply to wound-ensure hydrofera blue touches wound bed Secondary Dressing ABD Pad 5x9 (in/in) Discharge Instruction: Apply to secure hydrofera blue AND on shin area for protection Secured With Compression Wrap Profore Lite LF 3 Multilayer Compression Bandaging System Discharge Instruction: Apply 3 multi-layer wrap as prescribed. Compression Stockings Environmental education officer) Signed: 11/29/2020 5:43:31 PM By: Gretta Cool, BSN, RN, CWS, Kim RN, BSN Entered By: Gretta Cool, BSN,  RN, CWS, Kim on 11/27/2020 15:02:11 YZABELLE, CALLES ([id number]) -------------------------------------------------------------------------------- Wound Assessment Details Patient Name: [name] Date of Service: 11/27/2020 2:45 PM Medical Record Number: [id number] Patient Account Number: [id number] Date of Birth/Sex: 11-11-1950 (70 y.o. F) Treating RN: Cornell Barman Primary Care Reymond Maynez: [name] Other Clinician: Referring Arien Morine: [name] Treating Talha Iser/Extender: [name] in Treatment: 18 Wound Status Wound Number: 13 Primary Trauma, Other Etiology: Wound Location: Left, Midline, Anterior Lower Leg Wound Open Wounding Event: Shear/Friction Status: Date Acquired: 11/24/2020 Comorbid Cataracts, Chronic Obstructive Pulmonary Disease Weeks Of Treatment: 0 History: (COPD), Sleep Apnea, Hypertension, Osteoarthritis, Clustered Wound: No Neuropathy Photos Wound Measurements Length: (cm) 4 Width: (cm) 1.3 Depth: (cm) 0.1 Area: (cm) 4.084 Volume: (cm) 0.408 % Reduction in Area: % Reduction in Volume: Epithelialization: Large (67-100%) Tunneling: No Undermining: No Wound Description Classification: Partial Thickness Wound Margin: Indistinct, nonvisible Exudate Amount: Medium Exudate Type: Serous Exudate Color: amber Foul Odor After Cleansing: No Slough/Fibrino No Wound Bed Granulation Amount: Medium (34-66%) Exposed Structure Necrotic Amount: Medium (34-66%) Fascia Exposed: No Necrotic Quality: Adherent Slough Fat Layer (Subcutaneous Tissue) Exposed: Yes Tendon Exposed: No Muscle Exposed: No Joint Exposed: No Bone Exposed: No Treatment Notes Wound #13 (Lower Leg) Wound Laterality: Left, Midline, Anterior Cleanser Soap and Water Discharge Instruction: Gently cleanse wound with antibacterial soap, rinse and pat dry prior to dressing wounds Mcaffee, Latrica H. ([id number]) Peri-Wound Care Desitin Maximum Strength Ointment 4 (oz) Discharge  Instruction: Apply periwound for protection Topical Primary Dressing Hydrofera Blue Ready Transfer Foam, 4x5 (in/in) Discharge Instruction: Apply to wound-ensure hydrofera blue touches wound bed Secondary Dressing ABD Pad 5x9 (in/in) Discharge Instruction: Apply to secure hydrofera blue AND on shin area for  protection Secured With Compression Wrap Profore Lite LF 3 Multilayer Compression Bandaging System Discharge Instruction: Apply 3 multi-layer wrap as prescribed. Compression Stockings Environmental education officer) Signed: 11/29/2020 5:43:31 PM By: Gretta Cool, BSN, RN, CWS, Kim RN, BSN Entered By: Gretta Cool, BSN, RN, CWS, Kim on 11/27/2020 14:59:05 [name] ([id number]) -------------------------------------------------------------------------------- Vitals Details Patient Name: [name] Date of Service: 11/27/2020 2:45 PM Medical Record Number: [id number] Patient Account Number: [id number] Date of Birth/Sex: Sep 11, 1950 (70 y.o. F) Treating RN: Cornell Barman Primary Care Bonniejean Piano: [name] Other Clinician: Referring Negar Sieler: [name] Treating Tyjanae Bartek/Extender: [name] in Treatment: 18 Vital Signs Time Taken: 14:54 Temperature (F): 97.9 Height (in): 64 Pulse (bpm): 90 Weight (lbs): 265 Respiratory Rate (breaths/min): 16 Body Mass Index (BMI): 45.5 Blood Pressure (mmHg): 115/69 Reference Range: 80 - 120 mg / dl Electronic Signature(s) Signed: 11/29/2020 5:43:31 PM By: Gretta Cool, BSN, RN, CWS, Kim RN, BSN Entered By: Gretta Cool, BSN, RN, CWS, Kim on 11/27/2020 14:54:59

## 2020-12-07 ENCOUNTER — Encounter: Payer: Medicare HMO | Admitting: Physician Assistant

## 2020-12-07 ENCOUNTER — Other Ambulatory Visit: Payer: Self-pay

## 2020-12-07 DIAGNOSIS — L89623 Pressure ulcer of left heel, stage 3: Secondary | ICD-10-CM | POA: Diagnosis not present

## 2020-12-07 NOTE — Progress Notes (Addendum)
[name], [name] ([id number]) Visit Report for 12/07/2020 Chief Complaint Document Details Patient Name: [name], [name] Date of Service: 12/07/2020 3:30 PM Medical Record Number: [id number] Patient Account Number: [id number] Date of Birth/Sex: 11/14/1950 (69 y.o. F) Treating RN: [name] Primary Care Provider: [name] Other Clinician: Referring Provider: [name] Treating Provider/Extender: [name] in Treatment: 20 Information Obtained from: Patient Chief Complaint Left Heel Pressure Ulcer Electronic Signature(s) Signed: 12/07/2020 3:44:10 PM By: Worthy Keeler PA-C Entered By: Worthy Keeler on 12/07/2020 15:44:10 [name] ([id number]) -------------------------------------------------------------------------------- Debridement Details Patient Name: [name] Date of Service: 12/07/2020 3:30 PM Medical Record Number: [id number] Patient Account Number: [id number] Date of Birth/Sex: Mar 19, 1951 (69 y.o. F) Treating RN: [name] Primary Care Provider: [name] Other Clinician: Referring Provider: [name] Treating Provider/Extender: [name] in Treatment: 20 Debridement Performed for Wound #12 Left Calcaneus Assessment: Performed By: Physician Tommie Sams., PA-C Debridement Type: Debridement Level of Consciousness (Pre- Awake and Alert procedure): Pre-procedure Verification/Time Out Yes - 16:04 Taken: Start Time: 16:04 Total Area Debrided (L x W): 1.8 (cm) x 1.7 (cm) = 3.06 (cm) Tissue and other material Viable, Non-Viable, Slough, Subcutaneous, Slough debrided: Level: Skin/Subcutaneous Tissue Debridement Description: Excisional Instrument: Curette Bleeding: Minimum Hemostasis Achieved: Pressure Response to Treatment: Procedure was tolerated well Level of Consciousness (Post- Awake and Alert procedure): Post Debridement Measurements of Total Wound Length: (cm) 1.8 Stage: Category/Stage  III Width: (cm) 1.7 Depth: (cm) 0.2 Volume: (cm) 0.481 Character of Wound/Ulcer Post Debridement: Stable Post Procedure Diagnosis Same as Pre-procedure Electronic Signature(s) Signed: 12/07/2020 4:42:11 PM By: [name] RN Signed: 12/07/2020 5:38:02 PM By: Worthy Keeler PA-C Entered By: [name] on 12/07/2020 16:05:21 [name] ([id number]) -------------------------------------------------------------------------------- Debridement Details Patient Name: [name] Date of Service: 12/07/2020 3:30 PM Medical Record Number: [id number] Patient Account Number: [id number] Date of Birth/Sex: 01-Jan-1951 (69 y.o. F) Treating RN: [name] Primary Care Provider: [name] Other Clinician: Referring Provider: [name] Treating Provider/Extender: [name] in Treatment: 20 Debridement Performed for Wound #13 Left,Midline,Anterior Lower Leg Assessment: Performed By: Physician Tommie Sams., PA-C Debridement Type: Debridement Level of Consciousness (Pre- Awake and Alert procedure): Pre-procedure Verification/Time Out Yes - 16:04 Taken: Start Time: 16:04 Total Area Debrided (L x W): 4 (cm) x 1.1 (cm) = 4.4 (cm) Tissue and other material Viable, Non-Viable, Slough, Subcutaneous, Biofilm, Slough debrided: Level: Skin/Subcutaneous Tissue Debridement Description: Excisional Instrument: Curette Bleeding: Minimum Hemostasis Achieved: Pressure Response to Treatment: Procedure was tolerated well Level of Consciousness (Post- Awake and Alert procedure): Post Debridement Measurements of Total Wound Length: (cm) 4 Width: (cm) 1.1 Depth: (cm) 0.2 Volume: (cm) 0.691 Character of Wound/Ulcer Post Debridement: Stable Post Procedure Diagnosis Same as Pre-procedure Electronic Signature(s) Signed: 12/07/2020 4:42:11 PM By: [name] RN Signed: 12/07/2020 5:38:02 PM By: Worthy Keeler PA-C Entered By: [name] on 12/07/2020  16:06:02 [name] ([id number]) -------------------------------------------------------------------------------- HPI Details Patient Name: [name] Date of Service: 12/07/2020 3:30 PM Medical Record Number: [id number] Patient Account Number: [id number] Date of Birth/Sex: [birth year] (69 y.o. F) Treating RN: [name] Primary Care Provider: [name] Other Clinician: Referring Provider: [name] Treating Provider/Extender: [name] in Treatment: 64 History of Present Illness HPI Description: 70 year old with a past medical history significant for MS, urinary incontinence, and obesity. She has been seen in the wound clinic before for lower extremity ulcerations treated with compression therapy. she is also known to have hypertension,  peripheral vascular disease, COPD, obstructive sleep apnea, lumbar radiculopathy, kyphoscoliosis, urinary issues and tobacco abuse. Smokes a packet of cigarettes a day was recently seen at the [facility] for swelling of her legs and feet with a ulceration on the dorsum of the right foot which has been there for about 6 months. she was recently in the ER about a month ago where she was seen for shortness of breath and swelling of the legs and a chest x-ray was within normal limits had an increase in her BNP and was given Lasix and put on doxycycline for a mild cellulitis and possible UTI. Wounds aresmaller today 11/13/15. Debrided and will continue Santyl. 12/21/2015 -- she was admitted to the hospital overnight on 12/18/2015 and diagnosed with urinary retention and cellulitis of the left lower leg. is past to take clindamycin and use Santyl for her wounds. 01/15/2016 -- come back to see [place] for almost a month and continues to be noncompliant with her dressings 01/30/16 patient presents today for a follow-up visit concerning her ongoing bilateral lower extremity wounds. We have not seen her for the past 2 weeks  although we are supposed to be seen her for weekly visits. She currently has a Foley catheter. She tells me that the wounds are intensely painful especially with pressure and palpation at this point in time. Fortunately she is having no interval signs or symptoms of systemic infection but unfortunately the wounds have not improved dramatically since we last saw her. She does have home health coming to take care of her as well. She is currently not in any compression wraps. 02/06/16 ON evaluation today patient continues to experience discomfort regarding her bilateral lower extremity ulcers. She has continued to tolerate the dressing changes at this point in time and continues to have a Foley catheter as well. Fortunately she is back this week in the past she has been somewhat noncompliant with follow-ups I'm glad to see her today. She tells me that her pain level varies but can be as high as a 7 out of 10 with manipulation of the wound. She tells me that she used to be on oxycodone which was managed by the pain clinic although she is no longer on that as she tells me that she was actually smoking marijuana at the time and when this was found out they discontinued her pain medication. She no longer is taking anything pain medication wise and she also does not smoke cigarettes nor marijuana at this point. 02/12/16; this is a patient I don't believe I have previously seen. She has multiple sclerosis. She has 4 punched-out areas on the anterior lateral left leg 2 areas on the right these are all in the same condition. Reasonably small [dime size] wounds each was some depth. Surface of these wounds does not look particularly healthy as there is adherent slough. There is no evidence of surrounding infection or inflammation. ABIs in this clinic were 0.87 on the right and 0.81 on the left. She is listed as having PAD and is a smoker. Not a diabetic 02/20/16 patient I gave antibiotics to last week for erythema  around both wound sites on the left lateral leg and right lateral leg. This appears to be a lot better. One of the areas on the left leg is healed however she still has 3 punched-out open areas on the left lateral calf and one on the right lateral calf and one on the dorsal foot. She is an ex-smoker quitting 1  month ago. ABIs in this clinic were 0.87 on the right 0.81 on the left 03/12/16; this is a patient I really don't have a good sense of. It would appear for the first 5 or 6 weeks of her stay in this clinic she was cared for by Dr. Con Memos. She appeared on my schedule in mid October and I saw her twice. She has not been here however in 3 weeks. She has advanced Wound Care at home where she lives with her husband. She has multiple sclerosis. I saw her the first time she had 4 punched-out small wounds on the anterior lateral left leg it appears that she is now down to 2. She also had wounds on the lateral and medial aspect of her right leg which were also small and punched out. The only one that remains is on the right lateral. Because of the nature of her wound I went ahead and ordered formal arterial studies this showed an ABI of 1 on the right and one on the left. TBI of 1.4 on the right and 0.79 on the left. She had normal waveforms. She was in the emergency room on 02/28/16; there is a noted edema of her left leg after a fall. She had a duplex ultrasound that showed no evidence for an acute DVT from the left groin to the popliteal fossa. The study was limited in the calf veins due to edema. Also noticeable that she had a left inguinal enlarged lymph node up to 6.2 cm. She also had a left knee x-ray that showed no acute findings and a right foot x-ray that showed soft tissue swelling but no radiographic evidence of osteomyelitis. The patient does stop smoking 03-19-16 Ms Tormey presents today for evaluation of her bilateral lower extremity ulcerations. She states that she has not smoked in  several weeks. She denies any pain or discomfort to bilateral lower extremities, tolerating compression therapy as ordered. 03/26/16; I have not seen this patient in 2 weeks however she has done very well with improvements in the areas in question. After we obtain normal arterial studies, increasing the 4-layer compression really seems to look done a nice job here. She has one open area on the left lateral leg and one on the medial right leg. These have filled in nicely and are now superficial wounds that showed epithelialized. I note the left inguinal lymph node at 6.2 cm. I may need to refer this back to the patient's primary doctor. 04/03/16; patient has 2 remaining wounds 1 on the left lateral leg and one on the right medial leg. Both of filled in nicely since we are able to increase her from a 3 layer to a 4 layer compression. I am also following up on the lymph node notable on her left leg DVT rule out in November 04/10/16; area on the right lateral leg is healed. The area on the left leg is still open but looks considerably better with healthy granulation and less wound area. I12/20/17; last week we healed the patient doubt with regards to the wound on the right leg to her on compression stocking 20-30 mmHg as it turns out I don't think she had a stocking, predictably therefore she has reopened. The left leg as well as the wound on the lateral aspect. Been generally small line 04/24/16; we healed out her right leg 2 weeks ago to her own 20-30 mm compression stockings although as it turns out she didn't have these and did not purchase some  apparently because of financial issues. The left leg wound is on the lateral aspect. Both of these wounds are just about healed. 05/01/16; her wounds on the left leg are healed out today. The area on the right leg is also healed. Vitamin diligent effort of our staff we have not been able to get any form of compression stocking to this patient. The orders for  juxtalite's were given to home health this never materialized. We ordered compression stockings I don't think she was able to afford these. We had a discussion today with both the patient and her husband without these, will accumulate edema and the wounds will simply reopen again. 05/14/16 READMISSION [name], [name] this is a patient we healed out 2 weeks ago. As bilateral lower extremity venous wounds probably some degree of lymphedema. At the beginning in November I did a duplex ultrasound of her left leg that was negative for DVT but did show a lymph node in the left inguinal area presumably reactive. We've been using compression to her lower extremities eventually healed all her wounds on her bilateral calves but we were not able to get any form of compression stockings for her in spite of intense efforts of our staff. She returns today with reopening on the left leg did not the right she has several superficial areas anteriorly but an actual frank ulcer on the lateral left calf. This is about the size of a dime or smaller. She does not really complain of pain. The patient has a history of PAD however arterial studies we did in November were normal. Her ABIs and Doppler waveforms were both within the normal limits. The patient has a history of MS. In discussing things with her today it would appear that the opening was noted 2 weeks ago according to the patient. We gave her Tubigrip stockings when she left the clinic 05/21/16; she has not yet had the duplex ultrasound of her thigh, this is booked for Thursday. The wound on the left lateral lower leg is improved 05/28/16; her duplex ultrasound of the left leg specifically her left thigh did not show a DVT however did show hypoechogenic enlarged left inguinal lymph nodes up to 6.9 x 3.7 x 6.8. This also showed in the one in November however maximal diameter was 6.2, at that point I thought these were reactive secondary to cellulitis  however there is obviously a differential here. Her wound on the left lower leg is closed. She has a superficial open area on the base of her left fifth metatarsal head on the plantar foot. It looks as though she has some wrap injuries on the anterior leg 06/05/16; since she was last year she had a fall on 1/31. She was seen in the ER but sent back home. I think she is also been seen by her primary physician who picked up on the swelling and the lymphadenopathy. She is ordered a CT scan of the abdomen and pelvis however this will be without contrast because of stage IIIB renal insufficiency. Nevertheless this should be a start the workup to make sure there is not is more systemic problem here. She will probably need consideration of a biopsy of the lymph node in her inguinal area, this would need general surgery. As far as her wounds are concern today the area on the left plantar foot is healed. She has a small weeping area on the left lower leg. The wrap injury from last week has largely Pardoxically there is a  lot less edema in the left thigh. 06/12/16; CT scan is on Friday. She has 2 small open areas one on the lateral left lower leg and one on the anterior lower leg on the left. Both of these looks healthy. I reduced her compression last week to Kerlix and Coban this seems to have not a reasonable job 07/02/16; the patient is now in the skilled facility. I think this was arranged out of the ER when she fell at home although I'm not exactly sure. She is going for I think a laparotomy for the pelvic mass next week. All the wounds in the left leg have healed Readmission: 07/20/2020 upon evaluation today patient presents for reevaluation here in clinic unfortunately she is having issues with a significant stage III pressure ulcer to her left heel. This is something that occurred when she was in the hospital sick. And subsequently though she does not appear to have any new injury she is still trying to  recover from the pressure injury which was quite severe during that time. I do not see any signs of infection right now which is great news. With that being said she has been require some debridement to try to clear this up as much as possible. We need to get the wound VAC cleared so that she can see overall healing and improvement that were looking for. The patient does have a history of hypertension, chronic kidney disease stage III, COPD, and multiple sclerosis. Currently she has been trying to do what she can to keep pressure off of her heel while in bed and in other locations as well. 08/04/20 upon evaluation today patient appears to be doing extremely well currently in regard to her wound on the heel. I do believe the Iodoflex has helped loosen things appear. Unfortunately she tells me that the home health nurse has not been putting the Iodoflex on the wound. That is news to [place] and that they tell me they got supplies in the mail but they did not see the Iodoflex being used. Nonetheless I am wondering if this is just something that they missed or if it truly has not been. I did tell them to look when they get home in their box of supplies to see if they saw the dressing which showed and what it should look like. 08/11/2020 upon evaluation today patient appears to be doing decently well in regard to her wound although unfortunately this is showing signs of some blue-green drainage consistent with Pseudomonas it does have a little worried in this regard. Fortunately there is no evidence of active infection which is great news. No fevers, chills, nausea, vomiting, or diarrhea. 08/18/2020 upon evaluation today patient appears to be doing well with regard to her wound. She has been tolerating the dressing changes without complication. Fortunately there is no signs of active infection at this time. No fevers, chills, nausea, vomiting, or diarrhea. 4/29; left heel wound pressure ulcer. Slightly  smaller. 09/04/2020 upon evaluation today patient appears to be doing well with regard to her wound. This is actually on her heel and seems to be doing well other than the fact that is a little bit moist from excessive drainage. There does not appear to be any signs of active infection at this time. No fevers, chills, nausea, vomiting, or diarrhea.. 09/11/2020 upon evaluation today patient appears to be doing okay in regard to her wound. With that being said she still has quite a bit of drainage noted at this point.  Fortunately there is no signs of active infection which is great news overall I am extremely pleased with where things stand today. I do think we need to try to do something a little bit more to help control the drainage a bit better. 5/23; left posterior heel. This actually looks quite good. Using Hydrofera Blue under compression. Dimensions are smaller 09/26/2020 upon evaluation today patient appears to be doing well with regard to her heel ulcer. In fact this is showing signs of excellent improvement and overall I am extremely pleased with where things stand today. I do not see any evidence of infection which is great news and overall I feel like she is headed in the correct direction. 10/09/2020 upon evaluation today patient appears to be doing excellent in regard to her wound. She has been tolerating the dressing changes without complication. Fortunately there is no evidence of active infection which is also excellent news. No fevers, chills, nausea, vomiting, or diarrhea. 10/16/2020 upon evaluation today patient appears to be doing well with regard to her heel ulcer. Unfortunately she is having issues with an area of deep tissue injury/pressure injury over the shin due to the wrap being on way too tight today. Fortunately there does not appear to be any signs of infection currently. No fevers, chills, nausea, vomiting, or diarrhea. 10/23/2020 upon evaluation today patient appears to be  doing better in regard to her wound. Fortunately there does not appear to be any signs of active infection at this time which is great news. In general I am extremely pleased with where things stand at this point. The patient does not show any signs of worsening which is excellent. She unfortunately still has the area in question with regard to the region over the anterior portion of her shin where she did have some deep tissue injury. Some of this is starting to open up a little bit. With that being said I think the biggest thing we need to do at this point is to try to as much as possible monitor for any additional breakdown and protect the region while it heals up. I think it is actually doing quite well which is great news. Fortunately there does not appear to be any evidence of active infection 11/14/2018 upon evaluation today patient appears to be doing okay in regard to her ischial. Her leg is much more swollen though she has not had a wrap on any type of dressing for some time when home health came out they did not have the Tubigrip. We have not actually seen her since June 27. The patient when I asked her about it seemed to get somewhat upset to be honest. She seems to be having some trouble at home. She states that she had to "raise hell" just to be able to get here to this appointment today. With that being said I really do think she needs to have [name], [name] H. ([id number]) this wraps and keep the swelling under control and was doing a whole lot better but now seems to be not doing quite as well to be honest. The anterior portion of her shin does seem to be healing and doing better although it did end up opening into the wound. Again that was where home health the wrap does have a little bit too tight causing this issue previously. 11/27/2020 upon evaluation today patient appears to be doing poorly in regard to her anterior shin where she did have some area where the wrap actually aggravated  the spot. With that being said I think that we do need to make sure to pad this extremely well there is already some signs of healing at this point. I did believe this was probably can open even when I saw it last week and to be honest it has opened. The heel also is showing signs of improvement I do see some slough noted but nothing too significant which is great news. No fevers, chills, nausea, vomiting, or diarrhea. 12/07/2020 upon evaluation today patient appears to be doing decently well in regard to her wounds. Both are showing signs of some slough and biofilm buildup on the surface of the wounds. Fortunately there is no signs of active infection at this time which is great news. No fevers, chills, nausea, vomiting, or diarrhea. Electronic Signature(s) Signed: 12/07/2020 4:12:45 PM By: Worthy Keeler PA-C Entered By: Worthy Keeler on 12/07/2020 16:12:45 [name] ([id number]) -------------------------------------------------------------------------------- Physical Exam Details Patient Name: [name] Date of Service: 12/07/2020 3:30 PM Medical Record Number: [id number] Patient Account Number: [id number] Date of Birth/Sex: 08-Nov-1950 (69 y.o. F) Treating RN: [name] Primary Care Provider: [name] Other Clinician: Referring Provider: [name] Treating Provider/Extender: [name] in Treatment: 70 Constitutional Well-nourished and well-hydrated in no acute distress. Respiratory normal breathing without difficulty. Psychiatric this patient is able to make decisions and demonstrates good insight into disease process. Alert and Oriented x 3. pleasant and cooperative. Notes Upon inspection patient's wound bed showed signs of good granulation epithelization at this point. I do not see any evidence of active infection which is great and overall I am extremely pleased with where we stand currently. I did have to perform sharp debridement clear  away some of the necrotic debris and the patient tolerated this today without complication. Post debridement the wound bed appears to be doing somewhat better. Electronic Signature(s) Signed: 12/07/2020 4:14:02 PM By: Worthy Keeler PA-C Entered By: Worthy Keeler on 12/07/2020 16:14:02 [name] ([id number]) -------------------------------------------------------------------------------- Physician Orders Details Patient Name: [name] Date of Service: 12/07/2020 3:30 PM Medical Record Number: [id number] Patient Account Number: [id number] Date of Birth/Sex: August 09, 1950 (69 y.o. F) Treating RN: [name] Primary Care Provider: [name] Other Clinician: Referring Provider: [name] Treating Provider/Extender: [name] in Treatment: 20 Verbal / Phone Orders: No Diagnosis Coding ICD-10 Coding Code Description [phone] Pressure ulcer of left heel, stage 3 L97.822 Non-pressure chronic ulcer of other part of left lower leg with fat layer exposed I10 Essential (primary) hypertension N18.30 Chronic kidney disease, stage 3 unspecified J44.9 Chronic obstructive pulmonary disease, unspecified G35 Multiple sclerosis Follow-up Appointments o Return Appointment in 1 week. [place]: - [facility] for wound care. May utilize formulary equivalent dressing for wound treatment orders unless otherwise specified. Home Health Nurse may visit PRN to address patientos wound care needs. o Scheduled days for dressing changes to be completed; exception, patient has scheduled wound care visit that day. o **Please direct any NON-WOUND related issues/requests for orders to patient's Primary Care Physician. **If current dressing causes regression in wound condition, may D/C ordered dressing product/s and apply Normal Saline Moist Dressing daily until next [place] or Other MD appointment. **Notify [facility] of regression in wound condition at [phone]. Edema Control - Lymphedema / Segmental Compressive Device / Other Bilateral Lower Extremities o Elevate, Exercise Daily and Avoid Standing for Long Periods of Time. o Elevate legs to the  level of the heart and pump ankles as often as possible - ELEVATE LEGS o Elevate leg(s) parallel to the floor when sitting. Off-Loading Left Lower Extremity o Open toe surgical shoe o Turn and reposition every 2 hours o Other: - Keep pressure off left heel Wound Treatment Wound #12 - Calcaneus Wound Laterality: Left Cleanser: Soap and Water 2 x Per Week/30 Days Discharge Instructions: Gently cleanse wound with antibacterial soap, rinse and pat dry prior to dressing wounds Peri-Wound Care: Desitin Maximum Strength Ointment 4 (oz) 2 x Per Week/30 Days Discharge Instructions: Apply periwound for protection Primary Dressing: Hydrofera Blue Ready Transfer Foam, 4x5 (in/in) (Generic) 2 x Per Week/30 Days Discharge Instructions: Apply to wound bed-cut hydrofera about 2 cm bigger around for absorption. Ensure hydrofera blue touches wound bed Secondary Dressing: ABD Pad 5x9 (in/in) 2 x Per Week/30 Days Discharge Instructions: Apply to secure hydrofera blue AND on shin area for protection Compression Wrap: Profore Lite LF 3 Multilayer Compression Bandaging System 2 x Per Week/30 Days Discharge Instructions: Apply 3 multi-layer wrap as prescribed. Wound #13 - Lower Leg Wound Laterality: Left, Midline, Anterior Cleanser: Soap and Water 2 x Per Week/30 Days [name], [name] ([id number]) Discharge Instructions: Gently cleanse wound with antibacterial soap, rinse and pat dry prior to dressing wounds Peri-Wound Care: Desitin Maximum Strength Ointment 4 (oz) 2 x Per Week/30 Days Discharge Instructions: Apply periwound for protection Primary Dressing: Hydrofera Blue Ready Transfer Foam, 4x5 (in/in) (Generic) 2 x Per Week/30 Days Discharge  Instructions: Apply to wound bed-cut hydrofera about 2 cm bigger around for absorption. Ensure hydrofera blue touches wound bed Secondary Dressing: ABD Pad 5x9 (in/in) 2 x Per Week/30 Days Discharge Instructions: Apply to secure hydrofera blue AND on shin area for protection Compression Wrap: Profore Lite LF 3 Multilayer Compression Bandaging System 2 x Per Week/30 Days Discharge Instructions: Apply 3 multi-layer wrap as prescribed. Electronic Signature(s) Signed: 12/07/2020 4:42:11 PM By: [name] RN Signed: 12/07/2020 5:38:02 PM By: Worthy Keeler PA-C Entered By: [name] on 12/07/2020 16:09:43 [name] ([id number]) -------------------------------------------------------------------------------- Problem List Details Patient Name: [name] Date of Service: 12/07/2020 3:30 PM Medical Record Number: [id number] Patient Account Number: [id number] Date of Birth/Sex: 1950-11-25 (69 y.o. F) Treating RN: [name] Primary Care Provider: [name] Other Clinician: Referring Provider: [name] Treating Provider/Extender: [name] in Treatment: 20 Active Problems ICD-10 Encounter Code Description Active Date MDM Diagnosis [phone] Pressure ulcer of left heel, stage 3 07/20/2020 No Yes L97.822 Non-pressure chronic ulcer of other part of left lower leg with fat layer 11/27/2020 No Yes exposed Coleman (primary) hypertension 07/20/2020 No Yes N18.30 Chronic kidney disease, stage 3 unspecified 07/20/2020 No Yes J44.9 Chronic obstructive pulmonary disease, unspecified 07/20/2020 No Yes G35 Multiple sclerosis 07/20/2020 No Yes Inactive Problems Resolved Problems Electronic Signature(s) Signed: 12/07/2020 3:44:05 PM By: Worthy Keeler PA-C Entered By: Worthy Keeler on 12/07/2020 15:44:04 [name], [name] ([id number]) -------------------------------------------------------------------------------- Progress Note Details Patient Name: [name] Date of Service: 12/07/2020 3:30 PM Medical Record Number: [id number] Patient Account Number: [id number] Date of Birth/Sex: 07/09/1950 (69 y.o. F) Treating RN: [name] Primary Care Provider: [name] Other Clinician: Referring Provider: [name] Treating Provider/Extender: [name] in Treatment: 20 Subjective Chief Complaint Information obtained from Patient Left Heel Pressure Ulcer History of Present Illness (HPI) 70 year old with a past medical history significant for MS, urinary incontinence, and obesity. She has been seen in the wound clinic before for lower  extremity ulcerations treated with compression therapy. she is also known to have hypertension, peripheral vascular disease, COPD, obstructive sleep apnea, lumbar radiculopathy, kyphoscoliosis, urinary issues and tobacco abuse. Smokes a packet of cigarettes a day was recently seen at the [facility] for swelling of her legs and feet with a ulceration on the dorsum of the right foot which has been there for about 6 months. she was recently in the ER about a month ago where she was seen for shortness of breath and swelling of the legs and a chest x-ray was within normal limits had an increase in her BNP and was given Lasix and put on doxycycline for a mild cellulitis and possible UTI. Wounds aresmaller today 11/13/15. Debrided and will continue Santyl. 12/21/2015 -- she was admitted to the hospital overnight on 12/18/2015 and diagnosed with urinary retention and cellulitis of the left lower leg. is past to take clindamycin and use Santyl for her wounds. 01/15/2016 -- come back to see [place] for almost a month and continues to be noncompliant with her dressings 01/30/16 patient presents today for a follow-up visit concerning her ongoing bilateral lower extremity wounds. We have not seen her for the past 2 weeks although we are supposed to be seen her for weekly visits. She currently has  a Foley catheter. She tells me that the wounds are intensely painful especially with pressure and palpation at this point in time. Fortunately she is having no interval signs or symptoms of systemic infection but unfortunately the wounds have not improved dramatically since we last saw her. She does have home health coming to take care of her as well. She is currently not in any compression wraps. 02/06/16 ON evaluation today patient continues to experience discomfort regarding her bilateral lower extremity ulcers. She has continued to tolerate the dressing changes at this point in time and continues to have a Foley catheter as well. Fortunately she is back this week in the past she has been somewhat noncompliant with follow-ups I'm glad to see her today. She tells me that her pain level varies but can be as high as a 7 out of 10 with manipulation of the wound. She tells me that she used to be on oxycodone which was managed by the pain clinic although she is no longer on that as she tells me that she was actually smoking marijuana at the time and when this was found out they discontinued her pain medication. She no longer is taking anything pain medication wise and she also does not smoke cigarettes nor marijuana at this point. 02/12/16; this is a patient I don't believe I have previously seen. She has multiple sclerosis. She has 4 punched-out areas on the anterior lateral left leg 2 areas on the right these are all in the same condition. Reasonably small [dime size] wounds each was some depth. Surface of these wounds does not look particularly healthy as there is adherent slough. There is no evidence of surrounding infection or inflammation. ABIs in this clinic were 0.87 on the right and 0.81 on the left. She is listed as having PAD and is a smoker. Not a diabetic 02/20/16 patient I gave antibiotics to last week for erythema around both wound sites on the left lateral leg and right lateral leg. This  appears to be a lot better. One of the areas on the left leg is healed however she still has 3 punched-out open areas on the left lateral calf and one on the right lateral  calf and one on the dorsal foot. She is an ex-smoker quitting 1 month ago. ABIs in this clinic were 0.87 on the right 0.81 on the left 03/12/16; this is a patient I really don't have a good sense of. It would appear for the first 5 or 6 weeks of her stay in this clinic she was cared for by Dr. Con Memos. She appeared on my schedule in mid October and I saw her twice. She has not been here however in 3 weeks. She has advanced Wound Care at home where she lives with her husband. She has multiple sclerosis. I saw her the first time she had 4 punched-out small wounds on the anterior lateral left leg it appears that she is now down to 2. She also had wounds on the lateral and medial aspect of her right leg which were also small and punched out. The only one that remains is on the right lateral. Because of the nature of her wound I went ahead and ordered formal arterial studies this showed an ABI of 1 on the right and one on the left. TBI of 1.4 on the right and 0.79 on the left. She had normal waveforms. She was in the emergency room on 02/28/16; there is a noted edema of her left leg after a fall. She had a duplex ultrasound that showed no evidence for an acute DVT from the left groin to the popliteal fossa. The study was limited in the calf veins due to edema. Also noticeable that she had a left inguinal enlarged lymph node up to 6.2 cm. She also had a left knee x-ray that showed no acute findings and a right foot x-ray that showed soft tissue swelling but no radiographic evidence of osteomyelitis. The patient does stop smoking 03-19-16 Ms Danial presents today for evaluation of her bilateral lower extremity ulcerations. She states that she has not smoked in several weeks. She denies any pain or discomfort to bilateral lower extremities,  tolerating compression therapy as ordered. 03/26/16; I have not seen this patient in 2 weeks however she has done very well with improvements in the areas in question. After we obtain normal arterial studies, increasing the 4-layer compression really seems to look done a nice job here. She has one open area on the left lateral leg and one on the medial right leg. These have filled in nicely and are now superficial wounds that showed epithelialized. I note the left inguinal lymph node at 6.2 cm. I may need to refer this back to the patient's primary doctor. 04/03/16; patient has 2 remaining wounds 1 on the left lateral leg and one on the right medial leg. Both of filled in nicely since we are able to increase her from a 3 layer to a 4 layer compression. I am also following up on the lymph node notable on her left leg DVT rule out in November 04/10/16; area on the right lateral leg is healed. The area on the left leg is still open but looks considerably better with healthy granulation and less wound area. I12/20/17; last week we healed the patient doubt with regards to the wound on the right leg to her on compression stocking 20-30 mmHg as it turns out I don't think she had a stocking, predictably therefore she has reopened. The left leg as well as the wound on the lateral aspect. Been generally small line 04/24/16; we healed out her right leg 2 weeks ago to her own 20-30 mm compression stockings although  as it turns out she didn't have these and did not purchase some apparently because of financial issues. The left leg wound is on the lateral aspect. Both of these wounds are just about healed. 05/01/16; her wounds on the left leg are healed out today. The area on the right leg is also healed. Vitamin diligent effort of our staff we have not been able to get any form of compression stocking to this patient. The orders for juxtalite's were given to home health this never materialized. We [name], [name] H.  ([id number]) ordered compression stockings I don't think she was able to afford these. We had a discussion today with both the patient and her husband without these, will accumulate edema and the wounds will simply reopen again. 05/14/16 READMISSION this is a patient we healed out 2 weeks ago. As bilateral lower extremity venous wounds probably some degree of lymphedema. At the beginning in November I did a duplex ultrasound of her left leg that was negative for DVT but did show a lymph node in the left inguinal area presumably reactive. We've been using compression to her lower extremities eventually healed all her wounds on her bilateral calves but we were not able to get any form of compression stockings for her in spite of intense efforts of our staff. She returns today with reopening on the left leg did not the right she has several superficial areas anteriorly but an actual frank ulcer on the lateral left calf. This is about the size of a dime or smaller. She does not really complain of pain. The patient has a history of PAD however arterial studies we did in November were normal. Her ABIs and Doppler waveforms were both within the normal limits. The patient has a history of MS. In discussing things with her today it would appear that the opening was noted 2 weeks ago according to the patient. We gave her Tubigrip stockings when she left the clinic 05/21/16; she has not yet had the duplex ultrasound of her thigh, this is booked for Thursday. The wound on the left lateral lower leg is improved 05/28/16; her duplex ultrasound of the left leg specifically her left thigh did not show a DVT however did show hypoechogenic enlarged left inguinal lymph nodes up to 6.9 x 3.7 x 6.8. This also showed in the one in November however maximal diameter was 6.2, at that point I thought these were reactive secondary to cellulitis however there is obviously a differential here. Her wound on the left lower leg is  closed. She has a superficial open area on the base of her left fifth metatarsal head on the plantar foot. It looks as though she has some wrap injuries on the anterior leg 06/05/16; since she was last year she had a fall on 1/31. She was seen in the ER but sent back home. I think she is also been seen by her primary physician who picked up on the swelling and the lymphadenopathy. She is ordered a CT scan of the abdomen and pelvis however this will be without contrast because of stage IIIB renal insufficiency. Nevertheless this should be a start the workup to make sure there is not is more systemic problem here. She will probably need consideration of a biopsy of the lymph node in her inguinal area, this would need general surgery. As far as her wounds are concern today the area on the left plantar foot is healed. She has a small weeping area on the left lower  leg. The wrap injury from last week has largely Pardoxically there is a lot less edema in the left thigh. 06/12/16; CT scan is on Friday. She has 2 small open areas one on the lateral left lower leg and one on the anterior lower leg on the left. Both of these looks healthy. I reduced her compression last week to Kerlix and Coban this seems to have not a reasonable job 07/02/16; the patient is now in the skilled facility. I think this was arranged out of the ER when she fell at home although I'm not exactly sure. She is going for I think a laparotomy for the pelvic mass next week. All the wounds in the left leg have healed Readmission: 07/20/2020 upon evaluation today patient presents for reevaluation here in clinic unfortunately she is having issues with a significant stage III pressure ulcer to her left heel. This is something that occurred when she was in the hospital sick. And subsequently though she does not appear to have any new injury she is still trying to recover from the pressure injury which was quite severe during that time. I do not see  any signs of infection right now which is great news. With that being said she has been require some debridement to try to clear this up as much as possible. We need to get the wound VAC cleared so that she can see overall healing and improvement that were looking for. The patient does have a history of hypertension, chronic kidney disease stage III, COPD, and multiple sclerosis. Currently she has been trying to do what she can to keep pressure off of her heel while in bed and in other locations as well. 08/04/20 upon evaluation today patient appears to be doing extremely well currently in regard to her wound on the heel. I do believe the Iodoflex has helped loosen things appear. Unfortunately she tells me that the home health nurse has not been putting the Iodoflex on the wound. That is news to [place] and that they tell me they got supplies in the mail but they did not see the Iodoflex being used. Nonetheless I am wondering if this is just something that they missed or if it truly has not been. I did tell them to look when they get home in their box of supplies to see if they saw the dressing which showed and what it should look like. 08/11/2020 upon evaluation today patient appears to be doing decently well in regard to her wound although unfortunately this is showing signs of some blue-green drainage consistent with Pseudomonas it does have a little worried in this regard. Fortunately there is no evidence of active infection which is great news. No fevers, chills, nausea, vomiting, or diarrhea. 08/18/2020 upon evaluation today patient appears to be doing well with regard to her wound. She has been tolerating the dressing changes without complication. Fortunately there is no signs of active infection at this time. No fevers, chills, nausea, vomiting, or diarrhea. 4/29; left heel wound pressure ulcer. Slightly smaller. 09/04/2020 upon evaluation today patient appears to be doing well with regard to her wound.  This is actually on her heel and seems to be doing well other than the fact that is a little bit moist from excessive drainage. There does not appear to be any signs of active infection at this time. No fevers, chills, nausea, vomiting, or diarrhea.. 09/11/2020 upon evaluation today patient appears to be doing okay in regard to her wound. With that being  said she still has quite a bit of drainage noted at this point. Fortunately there is no signs of active infection which is great news overall I am extremely pleased with where things stand today. I do think we need to try to do something a little bit more to help control the drainage a bit better. 5/23; left posterior heel. This actually looks quite good. Using Hydrofera Blue under compression. Dimensions are smaller 09/26/2020 upon evaluation today patient appears to be doing well with regard to her heel ulcer. In fact this is showing signs of excellent improvement and overall I am extremely pleased with where things stand today. I do not see any evidence of infection which is great news and overall I feel like she is headed in the correct direction. 10/09/2020 upon evaluation today patient appears to be doing excellent in regard to her wound. She has been tolerating the dressing changes without complication. Fortunately there is no evidence of active infection which is also excellent news. No fevers, chills, nausea, vomiting, or diarrhea. 10/16/2020 upon evaluation today patient appears to be doing well with regard to her heel ulcer. Unfortunately she is having issues with an area of deep tissue injury/pressure injury over the shin due to the wrap being on way too tight today. Fortunately there does not appear to be any signs of infection currently. No fevers, chills, nausea, vomiting, or diarrhea. 10/23/2020 upon evaluation today patient appears to be doing better in regard to her wound. Fortunately there does not appear to be any signs of active  infection at this time which is great news. In general I am extremely pleased with where things stand at this point. The patient does not show any signs of worsening which is excellent. She unfortunately still has the area in question with regard to the region over the anterior portion of her shin where she did have some deep tissue injury. Some of this is starting to open up a little bit. With that being said I think the biggest thing we need to do at this point is to try to as much as possible monitor for any additional breakdown and protect the region while it heals up. I think it is actually doing quite well which is great news. Fortunately there does not appear to be any evidence of active infection [name], [name]. ([id number]) 11/14/2018 upon evaluation today patient appears to be doing okay in regard to her ischial. Her leg is much more swollen though she has not had a wrap on any type of dressing for some time when home health came out they did not have the Tubigrip. We have not actually seen her since June 27. The patient when I asked her about it seemed to get somewhat upset to be honest. She seems to be having some trouble at home. She states that she had to "raise hell" just to be able to get here to this appointment today. With that being said I really do think she needs to have this wraps and keep the swelling under control and was doing a whole lot better but now seems to be not doing quite as well to be honest. The anterior portion of her shin does seem to be healing and doing better although it did end up opening into the wound. Again that was where home health the wrap does have a little bit too tight causing this issue previously. 11/27/2020 upon evaluation today patient appears to be doing poorly in regard to her anterior  shin where she did have some area where the wrap actually aggravated the spot. With that being said I think that we do need to make sure to pad this extremely well  there is already some signs of healing at this point. I did believe this was probably can open even when I saw it last week and to be honest it has opened. The heel also is showing signs of improvement I do see some slough noted but nothing too significant which is great news. No fevers, chills, nausea, vomiting, or diarrhea. 12/07/2020 upon evaluation today patient appears to be doing decently well in regard to her wounds. Both are showing signs of some slough and biofilm buildup on the surface of the wounds. Fortunately there is no signs of active infection at this time which is great news. No fevers, chills, nausea, vomiting, or diarrhea. Objective Constitutional Well-nourished and well-hydrated in no acute distress. Vitals Time Taken: 3:45 PM, Height: 64 in, Weight: 265 lbs, BMI: 45.5, Temperature: 98.7 F, Pulse: 82 bpm, Respiratory Rate: 18 breaths/min, Blood Pressure: 136/77 mmHg. Respiratory normal breathing without difficulty. Psychiatric this patient is able to make decisions and demonstrates good insight into disease process. Alert and Oriented x 3. pleasant and cooperative. General Notes: Upon inspection patient's wound bed showed signs of good granulation epithelization at this point. I do not see any evidence of active infection which is great and overall I am extremely pleased with where we stand currently. I did have to perform sharp debridement clear away some of the necrotic debris and the patient tolerated this today without complication. Post debridement the wound bed appears to be doing somewhat better. Integumentary (Hair, Skin) Wound #12 status is Open. Original cause of wound was Pressure Injury. The date acquired was: 06/27/2020. The wound has been in treatment 20 weeks. The wound is located on the Left Calcaneus. The wound measures 1.8cm length x 1.7cm width x 0.2cm depth; 2.403cm^2 area and 0.481cm^3 volume. There is Fat Layer (Subcutaneous Tissue) exposed. There is no  tunneling or undermining noted. There is a medium amount of serous drainage noted. The wound margin is thickened. There is large (67-100%) red, pink granulation within the wound bed. There is a small (1-33%) amount of necrotic tissue within the wound bed including Adherent Slough. Wound #13 status is Open. Original cause of wound was Shear/Friction. The date acquired was: 11/24/2020. The wound has been in treatment 1 weeks. The wound is located on the Left,Midline,Anterior Lower Leg. The wound measures 4cm length x 1.1cm width x 0.1cm depth; 3.456cm^2 area and 0.346cm^3 volume. There is Fat Layer (Subcutaneous Tissue) exposed. There is no tunneling or undermining noted. There is a medium amount of serosanguineous drainage noted. The wound margin is indistinct and nonvisible. There is medium (34-66%) red granulation within the wound bed. There is a medium (34-66%) amount of necrotic tissue within the wound bed including Adherent Slough. Assessment Active Problems ICD-10 Pressure ulcer of left heel, stage 3 Non-pressure chronic ulcer of other part of left lower leg with fat layer exposed Essential (primary) hypertension Chronic kidney disease, stage 3 unspecified Chronic obstructive pulmonary disease, unspecified Multiple sclerosis [name], [name] H. ([id number]) Procedures Wound #12 Pre-procedure diagnosis of Wound #12 is a Pressure Ulcer located on the Left Calcaneus . There was a Excisional Skin/Subcutaneous Tissue Debridement with a total area of 3.06 sq cm performed by Tommie Sams., PA-C. With the following instrument(s): Curette to remove Viable and Non-Viable tissue/material. Material removed includes Subcutaneous Tissue and Slough and.  A time out was conducted at 16:04, prior to the start of the procedure. A Minimum amount of bleeding was controlled with Pressure. The procedure was tolerated well. Post Debridement Measurements: 1.8cm length x 1.7cm width x 0.2cm depth; 0.481cm^3 volume.  Post debridement Stage noted as Category/Stage III. Character of Wound/Ulcer Post Debridement is stable. Post procedure Diagnosis Wound #12: Same as Pre-Procedure Pre-procedure diagnosis of Wound #12 is a Pressure Ulcer located on the Left Calcaneus . There was a Three Layer Compression Therapy Procedure with a pre-treatment ABI of 1.1 by [name], RN. Post procedure Diagnosis Wound #12: Same as Pre-Procedure Wound #13 Pre-procedure diagnosis of Wound #13 is a Trauma, Other located on the Left,Midline,Anterior Lower Leg . There was a Excisional Skin/Subcutaneous Tissue Debridement with a total area of 4.4 sq cm performed by Tommie Sams., PA-C. With the following instrument(s): Curette to remove Viable and Non-Viable tissue/material. Material removed includes Subcutaneous Tissue, Slough, and Biofilm. A time out was conducted at 16:04, prior to the start of the procedure. A Minimum amount of bleeding was controlled with Pressure. The procedure was tolerated well. Post Debridement Measurements: 4cm length x 1.1cm width x 0.2cm depth; 0.691cm^3 volume. Character of Wound/Ulcer Post Debridement is stable. Post procedure Diagnosis Wound #13: Same as Pre-Procedure Pre-procedure diagnosis of Wound #13 is a Trauma, Other located on the Left,Midline,Anterior Lower Leg . There was a Three Layer Compression Therapy Procedure with a pre-treatment ABI of 1.1 by [name], RN. Post procedure Diagnosis Wound #13: Same as Pre-Procedure Plan Follow-up Appointments: Return Appointment in 1 week. Home Health: [facility] for wound care. May utilize formulary equivalent dressing for wound treatment orders unless otherwise specified. Home Health Nurse may visit PRN to address patient s wound care needs. Scheduled days for dressing changes to be completed; exception, patient has scheduled wound care visit that day. **Please direct any NON-WOUND related  issues/requests for orders to patient's Primary Care Physician. **If current dressing causes regression in wound condition, may D/C ordered dressing product/s and apply Normal Saline Moist Dressing daily until next [place] or Other MD appointment. **Notify [facility] of regression in wound condition at [phone]. Edema Control - Lymphedema / Segmental Compressive Device / Other: Elevate, Exercise Daily and Avoid Standing for Long Periods of Time. Elevate legs to the level of the heart and pump ankles as often as possible - ELEVATE LEGS Elevate leg(s) parallel to the floor when sitting. Off-Loading: Open toe surgical shoe Turn and reposition every 2 hours Other: - Keep pressure off left heel WOUND #12: - Calcaneus Wound Laterality: Left Cleanser: Soap and Water 2 x Per Week/30 Days Discharge Instructions: Gently cleanse wound with antibacterial soap, rinse and pat dry prior to dressing wounds Peri-Wound Care: Desitin Maximum Strength Ointment 4 (oz) 2 x Per Week/30 Days Discharge Instructions: Apply periwound for protection Primary Dressing: Hydrofera Blue Ready Transfer Foam, 4x5 (in/in) (Generic) 2 x Per Week/30 Days Discharge Instructions: Apply to wound bed-cut hydrofera about 2 cm bigger around for absorption. Ensure hydrofera blue touches wound bed Secondary Dressing: ABD Pad 5x9 (in/in) 2 x Per Week/30 Days Discharge Instructions: Apply to secure hydrofera blue AND on shin area for protection Compression Wrap: Profore Lite LF 3 Multilayer Compression Bandaging System 2 x Per Week/30 Days Discharge Instructions: Apply 3 multi-layer wrap as prescribed. WOUND #13: - Lower Leg Wound Laterality: Left, Midline, Anterior Cleanser: Soap and Water 2 x Per Week/30 Days Discharge Instructions: Gently cleanse  wound with antibacterial soap, rinse and pat dry prior to dressing wounds Peri-Wound Care: Desitin Maximum Strength Ointment 4 (oz) 2 x Per Week/30  Days Discharge Instructions: Apply periwound for protection Primary Dressing: Hydrofera Blue Ready Transfer Foam, 4x5 (in/in) (Generic) 2 x Per Week/30 Days [name], [name] H. ([id number]) Discharge Instructions: Apply to wound bed-cut hydrofera about 2 cm bigger around for absorption. Ensure hydrofera blue touches wound bed Secondary Dressing: ABD Pad 5x9 (in/in) 2 x Per Week/30 Days Discharge Instructions: Apply to secure hydrofera blue AND on shin area for protection Compression Wrap: Profore Lite LF 3 Multilayer Compression Bandaging System 2 x Per Week/30 Days Discharge Instructions: Apply 3 multi-layer wrap as prescribed. 1. Would recommend currently that we going continue with the wound care measures as before and the patient is in agreement with the plan. I am definitely feel like the Hydrofera Blue is doing a good job here. 2. I am also can recommend we continue with an ABD pad to cover the anterior portion of the shin. 3. I am also can recommend that we have the patient continue with the 3 layer compression wrap which I think is also doing well for her. We will see patient back for reevaluation in 1 week here in the clinic. If anything worsens or changes patient will contact our office for additional recommendations. Electronic Signature(s) Signed: 12/07/2020 4:14:39 PM By: Worthy Keeler PA-C Entered By: Worthy Keeler on 12/07/2020 16:14:39 [name], [name] ([id number]) -------------------------------------------------------------------------------- SuperBill Details Patient Name: [name] Date of Service: 12/07/2020 Medical Record Number: [id number] Patient Account Number: [id number] Date of Birth/Sex: 1950-12-11 (69 y.o. F) Treating RN: [name] Primary Care Provider: [name] Other Clinician: Referring Provider: [name] Treating Provider/Extender: [name] in Treatment: 20 Diagnosis Coding ICD-10 Codes Code Description [phone]  Pressure ulcer of left heel, stage 3 L97.822 Non-pressure chronic ulcer of other part of left lower leg with fat layer exposed I10 Essential (primary) hypertension N18.30 Chronic kidney disease, stage 3 unspecified J44.9 Chronic obstructive pulmonary disease, unspecified G35 Multiple sclerosis Facility Procedures CPT4 Code: [id number] Description: [id number] - DEB SUBQ TISSUE 20 SQ CM/< Modifier: Quantity: 1 CPT4 Code: Description: ICD-10 Diagnosis Description L97.822 Non-pressure chronic ulcer of other part of left lower leg with fat layer L89.623 Pressure ulcer of left heel, stage 3 Modifier: exposed Quantity: Physician Procedures CPT4 Code: [id number] Description: 11042 - WC PHYS SUBQ TISS 20 SQ CM Modifier: Quantity: 1 CPT4 Code: Description: ICD-10 Diagnosis Description L97.822 Non-pressure chronic ulcer of other part of left lower leg with fat layer L89.623 Pressure ulcer of left heel, stage 3 Modifier: exposed Quantity: Electronic Signature(s) Signed: 12/07/2020 4:42:11 PM By: [name] RN Signed: 12/07/2020 5:38:02 PM By: Worthy Keeler PA-C Previous Signature: 12/07/2020 4:15:35 PM Version By: Worthy Keeler PA-C Entered By: [name] on 12/07/2020 16:24:40

## 2020-12-07 NOTE — Progress Notes (Addendum)
LATONA, KRICHBAUM ([id number]) Visit Report for 12/07/2020 Arrival Information Details Patient Name: [name], [name] Date of Service: 12/07/2020 3:30 PM Medical Record Number: [id number] Patient Account Number: [id number] Date of Birth/Sex: Aug 30, 1950 (69 y.o. F) Treating RN: [name] Primary Care Montario Zilka: [name] Other Clinician: Referring Rigdon Macomber: [name] Treating Cadon Raczka/Extender: [name] in Treatment: 20 Visit Information History Since Last Visit Pain Present Now: No Patient Arrived: Wheel Chair Arrival Time: 15:45 Accompanied By: husband Transfer Assistance: Manual Patient Identification Verified: Yes Secondary Verification Process Completed: Yes Patient Requires Transmission-Based Precautions: No Patient Has Alerts: Yes Electronic Signature(s) Signed: 12/07/2020 4:42:11 PM By: [name] RN Entered By: [name] on 12/07/2020 15:45:51 [name] ([id number]) -------------------------------------------------------------------------------- Clinic Level of Care Assessment Details Patient Name: [name] Date of Service: 12/07/2020 3:30 PM Medical Record Number: [id number] Patient Account Number: [id number] Date of Birth/Sex: December 15, 1950 (69 y.o. F) Treating RN: [name] Primary Care Drayton Tieu: [name] Other Clinician: Referring Alexio Sroka: [name] Treating Breia Ocampo/Extender: [name] in Treatment: 20 Clinic Level of Care Assessment Items TOOL 1 Quantity Score _0  - Use when EandM and Procedure is performed on INITIAL visit 0 ASSESSMENTS - Nursing Assessment / Reassessment _1  - General Physical Exam (combine w/ comprehensive assessment (listed just below) when performed on new 0 pt. evals) _2  - 0 Comprehensive Assessment (HX, ROS, Risk Assessments, Wounds Hx, etc.) ASSESSMENTS - Wound and Skin Assessment / Reassessment _3  - Dermatologic / Skin Assessment (not related to wound area)  0 ASSESSMENTS - Ostomy and/or Continence Assessment and Care _4  - Incontinence Assessment and Management 0 _5  - 0 Ostomy Care Assessment and Management (repouching, etc.) PROCESS - Coordination of Care _6  - Simple Patient / Family Education for ongoing care 0 _7  - 0 Complex (extensive) Patient / Family Education for ongoing care _8  - 0 Staff obtains Programmer, systems, Records, Test Results / Process Orders _9  - 0 Staff telephones HHA, Nursing Homes / Clarify orders / etc _10  - 0 Routine Transfer to another Facility (non-emergent condition) _11  - 0 Routine Hospital Admission (non-emergent condition) _12  - 0 New Admissions / Biomedical engineer / Ordering NPWT, Apligraf, etc. _13  - 0 Emergency Hospital Admission (emergent condition) PROCESS - Special Needs _14  - Pediatric / Minor Patient Management 0 _15  - 0 Isolation Patient Management _16  - 0 Hearing / Language / Visual special needs _17  - 0 Assessment of Community assistance (transportation, D/C planning, etc.) _18  - 0 Additional assistance / Altered mentation _19  - 0 Support Surface(s) Assessment (bed, cushion, seat, etc.) INTERVENTIONS - Miscellaneous _20  - External ear exam 0 _21  - 0 Patient Transfer (multiple staff / Civil Service fast streamer / Similar devices) _22  - 0 Simple Staple / Suture removal (25 or less) _23  - 0 Complex Staple / Suture removal (26 or more) _24  - 0 Hypo/Hyperglycemic Management (do not check if billed separately) _25  - 0 Ankle / Brachial Index (ABI) - do not check if billed separately Has the patient been seen at the hospital within the last three years: Yes Total Score: 0 Level Of Care: ____ [name] ([id number]) Electronic Signature(s) Signed: 12/07/2020 4:42:11 PM By: [name] RN Entered By: [name] on 12/07/2020 16:24:30 [name] ([id number]) -------------------------------------------------------------------------------- Compression Therapy Details Patient Name: [name] Date of  Service: 12/07/2020 3:30 PM Medical Record Number: [id number] Patient Account Number: [id number] Date of Birth/Sex: 04/27/1951 (69 y.o. F) Treating RN: [name] Primary Care Whitley Strycharz: [name] Other Clinician: Referring Kalib Bhagat: [name] Treating  Jermany Rimel/Extender: [name] Weeks in Treatment: 20 Compression Therapy Performed for Wound Assessment: Wound #13 Left,Midline,Anterior Lower Leg Performed By: Cora Daniels, RN Compression Type: Three Layer Pre Treatment ABI: 1.1 Post Procedure Diagnosis Same as Pre-procedure Electronic Signature(s) Signed: 12/07/2020 4:42:11 PM By: [name] RN Entered By: [name] on 12/07/2020 16:06:29 [name] ([id number]) -------------------------------------------------------------------------------- Compression Therapy Details Patient Name: [name] Date of Service: 12/07/2020 3:30 PM Medical Record Number: [id number] Patient Account Number: [id number] Date of Birth/Sex: [birth year] (69 y.o. F) Treating RN: [name] Primary Care Ameya Vowell: [name] Other Clinician: Referring Penny Arrambide: [name] Treating Bane Hagy/Extender: [name] Weeks in Treatment: 20 Compression Therapy Performed for Wound Assessment: Wound #12 Left Calcaneus Performed By: Clinician [name], RN Compression Type: Three Layer Pre Treatment ABI: 1.1 Post Procedure Diagnosis Same as Pre-procedure Electronic Signature(s) Signed: 12/07/2020 4:42:11 PM By: [name] RN Entered By: [name] on 12/07/2020 16:06:29 [name] ([id number]) -------------------------------------------------------------------------------- Lower Extremity Assessment Details Patient Name: [name] Date of Service: 12/07/2020 3:30 PM Medical Record Number: [id number] Patient Account Number: [id number] Date of Birth/Sex: 1950-10-12 (69 y.o. F) Treating RN: [name] Primary Care Rubee Vega:  [name] Other Clinician: Referring Sayuri Rhames: [name] Treating Lennin Osmond/Extender: [name] Weeks in Treatment: 20 Edema Assessment Assessed: [Left: Yes] [Right: No] Edema: [Left: Ye] [Right: s] Calf Left: Right: Point of Measurement: 29 cm From Medial Instep 30 cm Ankle Left: Right: Point of Measurement: 10 cm From Medial Instep 20.5 cm Vascular Assessment Pulses: Dorsalis Pedis Palpable: [Left:Yes] Electronic Signature(s) Signed: 12/07/2020 4:42:11 PM By: [name] RN Entered By: [name] on 12/07/2020 15:58:09 [name], [name] ([id number]) -------------------------------------------------------------------------------- Multi Wound Chart Details Patient Name: [name] Date of Service: 12/07/2020 3:30 PM Medical Record Number: [id number] Patient Account Number: [id number] Date of Birth/Sex: 06-04-1950 (69 y.o. F) Treating RN: [name] Primary Care Karma Ansley: [name] Other Clinician: Referring Asahel Risden: [name] Treating Rhia Blatchford/Extender: [name] in Treatment: 20 Vital Signs Height(in): 47 Pulse(bpm): 75 Weight(lbs): 265 Blood Pressure(mmHg): 136/77 Body Mass Index(BMI): 45 Temperature(F): 98.7 Respiratory Rate(breaths/min): 18 Photos: [N/A:N/A] Wound Location: Left Calcaneus Left, Midline, Anterior Lower Leg N/A Wounding Event: Pressure Injury Shear/Friction N/A Primary Etiology: Pressure Ulcer Trauma, Other N/A Comorbid History: Cataracts, Chronic Obstructive Cataracts, Chronic Obstructive N/A Pulmonary Disease (COPD), Sleep Pulmonary Disease (COPD), Sleep Apnea, Hypertension, Apnea, Hypertension, Osteoarthritis, Neuropathy Osteoarthritis, Neuropathy Date Acquired: 06/27/2020 11/24/2020 N/A Weeks of Treatment: 20 1 N/A Wound Status: Open Open N/A Measurements L x W x D (cm) 1.8x1.7x0.2 4x1.1x0.1 N/A Area (cm) : 2.403 3.456 N/A Volume (cm) : 0.481 0.346 N/A % Reduction in Area: 52.90%  15.40% N/A % Reduction in Volume: 52.90% 15.20% N/A Classification: Category/Stage III Partial Thickness N/A Exudate Amount: Medium Medium N/A Exudate Type: Serous Serosanguineous N/A Exudate Color: amber red, brown N/A Wound Margin: Thickened Indistinct, nonvisible N/A Granulation Amount: Large (67-100%) Medium (34-66%) N/A Granulation Quality: Red, Pink Red N/A Necrotic Amount: Small (1-33%) Medium (34-66%) N/A Exposed Structures: Fat Layer (Subcutaneous Tissue): Fat Layer (Subcutaneous Tissue): N/A Yes Yes Fascia: No Fascia: No Tendon: No Tendon: No Muscle: No Muscle: No Joint: No Joint: No Bone: No Bone: No Epithelialization: Small (1-33%) Medium (34-66%) N/A Treatment Notes Electronic Signature(s) Signed: 12/07/2020 4:42:11 PM By: [name] RN Entered By: [name] on 12/07/2020 16:04:39 [name] ([id number]) -------------------------------------------------------------------------------- Multi-Disciplinary Care Plan Details Patient Name: [name] Date of Service: 12/07/2020 3:30 PM Medical Record Number: [id number] Patient  Account Number: [id number] Date of Birth/Sex: 07/09/1950 (69 y.o. F) Treating RN: [name] Primary Care Latanja Lehenbauer: [name] Other Clinician: Referring Jerik Falletta: [name] Treating Beronica Lansdale/Extender: [name] in Treatment: 20 Active Inactive Wound/Skin Impairment Nursing Diagnoses: Impaired tissue integrity Goals: Patient/caregiver will verbalize understanding of skin care regimen Date Initiated: 07/20/2020 Date Inactivated: 09/04/2020 Target Resolution Date: 09/19/2020 Goal Status: Met Ulcer/skin breakdown will have a volume reduction of 30% by week 4 Date Initiated: 07/20/2020 Date Inactivated: 08/25/2020 Target Resolution Date: 08/20/2020 Goal Status: Met Ulcer/skin breakdown will have a volume reduction of 50% by week 8 Date Initiated: 07/20/2020 Date Inactivated: 10/09/2020 Target  Resolution Date: 09/19/2020 Goal Status: Met Ulcer/skin breakdown will have a volume reduction of 80% by week 12 Date Initiated: 07/20/2020 Target Resolution Date: 10/20/2020 Goal Status: Active Ulcer/skin breakdown will heal within 14 weeks Date Initiated: 07/20/2020 Target Resolution Date: 11/19/2020 Goal Status: Active Interventions: Assess patient/caregiver ability to obtain necessary supplies Assess patient/caregiver ability to perform ulcer/skin care regimen upon admission and as needed Provide education on ulcer and skin care Treatment Activities: Referred to DME Anushree Dorsi for dressing supplies : 07/20/2020 Skin care regimen initiated : 07/20/2020 Notes: Electronic Signature(s) Signed: 12/07/2020 4:42:11 PM By: [name] RN Entered By: [name] on 12/07/2020 16:04:30 [name] ([id number]) -------------------------------------------------------------------------------- Pain Assessment Details Patient Name: [name] Date of Service: 12/07/2020 3:30 PM Medical Record Number: [id number] Patient Account Number: [id number] Date of Birth/Sex: October 29, 1950 (69 y.o. F) Treating RN: [name] Primary Care Eirik Schueler: [name] Other Clinician: Referring Valentino Saavedra: [name] Treating Nurah Petrides/Extender: [name] in Treatment: 20 Active Problems Location of Pain Severity and Description of Pain Patient Has Paino No Site Locations Rate the pain. Current Pain Level: 0 Pain Management and Medication Current Pain Management: Electronic Signature(s) Signed: 12/07/2020 4:42:11 PM By: [name] RN Entered By: [name] on 12/07/2020 15:46:54 [name] ([id number]) -------------------------------------------------------------------------------- Patient/Caregiver Education Details Patient Name: [name] Date of Service: 12/07/2020 3:30 PM Medical Record Number: [id number] Patient Account Number: [id number] Date of  Birth/Gender: [birth year] (69 y.o. F) Treating RN: [name] Primary Care Physician: [name] Other Clinician: Referring Physician: [name] Treating Physician/Extender: [name] in Treatment: 20 Education Assessment Education Provided To: Patient Education Topics Provided Wound/Skin Impairment: Methods: Explain/Verbal Responses: State content correctly Electronic Signature(s) Signed: 12/07/2020 4:42:11 PM By: [name] RN Entered By: [name] on 12/07/2020 16:24:47 [name] ([id number]) -------------------------------------------------------------------------------- Wound Assessment Details Patient Name: [name] Date of Service: 12/07/2020 3:30 PM Medical Record Number: [id number] Patient Account Number: [id number] Date of Birth/Sex: 1950-09-30 (69 y.o. F) Treating RN: [name] Primary Care Geddy [place]: [name] Other Clinician: Referring Eleana Tocco: [name] Treating Kemesha Mosey/Extender: [name] in Treatment: 20 Wound Status Wound Number: 12 Primary Pressure Ulcer Etiology: Wound Location: Left Calcaneus Wound Open Wounding Event: Pressure Injury Status: Date Acquired: 06/27/2020 Comorbid Cataracts, Chronic Obstructive Pulmonary Disease Weeks Of Treatment: 20 History: (COPD), Sleep Apnea, Hypertension, Osteoarthritis, Clustered Wound: No Neuropathy Photos Wound Measurements Length: (cm) 1.8 Width: (cm) 1.7 Depth: (cm) 0.2 Area: (cm) 2.403 Volume: (cm) 0.481 % Reduction in Area: 52.9% % Reduction in Volume: 52.9% Epithelialization: Small (1-33%) Tunneling: No Undermining: No Wound Description Classification: Category/Stage III Wound Margin: Thickened Exudate Amount: Medium Exudate Type: Serous Exudate Color: amber Foul Odor After Cleansing: No Slough/Fibrino Yes Wound Bed Granulation Amount: Large (67-100%) Exposed Structure Granulation Quality: Red, Pink Fascia Exposed:  No Necrotic Amount: Small (1-33%) Fat Layer (  Subcutaneous Tissue) Exposed: Yes Necrotic Quality: Adherent Slough Tendon Exposed: No Muscle Exposed: No Joint Exposed: No Bone Exposed: No Electronic Signature(s) Signed: 12/07/2020 4:42:11 PM By: [name] RN Entered By: [name] on 12/07/2020 15:56:36 [name], [name] ([id number]) -------------------------------------------------------------------------------- Wound Assessment Details Patient Name: [name] Date of Service: 12/07/2020 3:30 PM Medical Record Number: [id number] Patient Account Number: [id number] Date of Birth/Sex: 1950-09-03 (69 y.o. F) Treating RN: [name] Primary Care Kauan Kloosterman: [name] Other Clinician: Referring Juliene Kirsh: [name] Treating Ashlay Altieri/Extender: [name] in Treatment: 20 Wound Status Wound Number: 13 Primary Trauma, Other Etiology: Wound Location: Left, Midline, Anterior Lower Leg Wound Open Wounding Event: Shear/Friction Status: Date Acquired: 11/24/2020 Comorbid Cataracts, Chronic Obstructive Pulmonary Disease Weeks Of Treatment: 1 History: (COPD), Sleep Apnea, Hypertension, Osteoarthritis, Clustered Wound: No Neuropathy Photos Wound Measurements Length: (cm) 4 Width: (cm) 1.1 Depth: (cm) 0.1 Area: (cm) 3.456 Volume: (cm) 0.346 % Reduction in Area: 15.4% % Reduction in Volume: 15.2% Epithelialization: Medium (34-66%) Tunneling: No Undermining: No Wound Description Classification: Partial Thickness Wound Margin: Indistinct, nonvisible Exudate Amount: Medium Exudate Type: Serosanguineous Exudate Color: red, brown Foul Odor After Cleansing: No Slough/Fibrino No Wound Bed Granulation Amount: Medium (34-66%) Exposed Structure Granulation Quality: Red Fascia Exposed: No Necrotic Amount: Medium (34-66%) Fat Layer (Subcutaneous Tissue) Exposed: Yes Necrotic Quality: Adherent Slough Tendon Exposed: No Muscle Exposed: No Joint  Exposed: No Bone Exposed: No Electronic Signature(s) Signed: 12/07/2020 4:42:11 PM By: [name] RN Entered By: [name] on 12/07/2020 15:57:13 [name] ([id number]) -------------------------------------------------------------------------------- Vitals Details Patient Name: [name] Date of Service: 12/07/2020 3:30 PM Medical Record Number: [id number] Patient Account Number: [id number] Date of Birth/Sex: April 20, 1951 (69 y.o. F) Treating RN: [name] Primary Care [place]: [name] Other Clinician: Referring Tkai Serfass: [name] Treating Aurianna Earlywine/Extender: [name] in Treatment: 20 Vital Signs Time Taken: 15:45 Temperature (F): 98.7 Height (in): 64 Pulse (bpm): 82 Weight (lbs): 265 Respiratory Rate (breaths/min): 18 Body Mass Index (BMI): 45.5 Blood Pressure (mmHg): 136/77 Reference Range: 80 - 120 mg / dl Electronic Signature(s) Signed: 12/07/2020 4:42:11 PM By: [name] RN Entered By: [name] on 12/07/2020 15:46:47

## 2020-12-15 ENCOUNTER — Encounter: Payer: Medicare HMO | Admitting: Physician Assistant

## 2020-12-15 ENCOUNTER — Other Ambulatory Visit: Payer: Self-pay

## 2020-12-15 DIAGNOSIS — L89623 Pressure ulcer of left heel, stage 3: Secondary | ICD-10-CM | POA: Diagnosis not present

## 2020-12-15 NOTE — Progress Notes (Addendum)
[name], [name] ([id number]) Visit Report for 12/15/2020 Arrival Information Details Patient Name: [name] Date of Service: 12/15/2020 3:45 PM Medical Record Number: [id number] Patient Account Number: [id number] Date of Birth/Sex: 1950-07-30 (69 y.o. F) Treating RN: Donnamarie Poag Primary Care Rami Waddle: [name] Other Clinician: Referring Demosthenes Virnig: [name] Treating Josie Burleigh/Extender: [name] in Treatment: 21 Visit Information History Since Last Visit Added or deleted any medications: No Patient Arrived: Wheel Chair Had a fall or experienced change in No Arrival Time: 16:14 activities of daily living that may affect Accompanied By: husband risk of falls: Transfer Assistance: EasyP[employer] Patient Hospitalized since last visit: No Lift Has Dressing in Place as Prescribed: Yes Patient Identification Verified: Yes Has Compression in Place as Prescribed: Yes Secondary Verification Process Completed: Yes Pain Present Now: Yes Patient Requires Transmission-Based No Precautions: Patient Has Alerts: Yes Electronic Signature(s) Signed: 12/15/2020 4:31:30 PM By: Donnamarie Poag Entered By: Donnamarie Poag on 12/15/2020 16:14:55 [name] ([id number]) -------------------------------------------------------------------------------- Clinic Level of Care Assessment Details Patient Name: [name] Date of Service: 12/15/2020 3:45 PM Medical Record Number: [id number] Patient Account Number: [id number] Date of Birth/Sex: 1950/09/11 (69 y.o. F) Treating RN: Donnamarie Poag Primary Care Lajuane Leatham: [name] Other Clinician: Referring Jennette Leask: [name] Treating Ahtziry Saathoff/Extender: [name] in Treatment: 21 Clinic Level of Care Assessment Items TOOL 1 Quantity Score '[]'  - Use when EandM and Procedure is performed on INITIAL visit 0 ASSESSMENTS - Nursing Assessment / Reassessment '[]'  - General Physical Exam (combine w/ comprehensive assessment  (listed just below) when performed on new 0 pt. evals) '[]'  - 0 Comprehensive Assessment (HX, ROS, Risk Assessments, Wounds Hx, etc.) ASSESSMENTS - Wound and Skin Assessment / Reassessment '[]'  - Dermatologic / Skin Assessment (not related to wound area) 0 ASSESSMENTS - Ostomy and/or Continence Assessment and Care '[]'  - Incontinence Assessment and Management 0 '[]'  - 0 Ostomy Care Assessment and Management (repouching, etc.) PROCESS - Coordination of Care '[]'  - Simple Patient / Family Education for ongoing care 0 '[]'  - 0 Complex (extensive) Patient / Family Education for ongoing care '[]'  - 0 Staff obtains Programmer, systems, Records, Test Results / Process Orders '[]'  - 0 Staff telephones HHA, Nursing Homes / Clarify orders / etc '[]'  - 0 Routine Transfer to another Facility (non-emergent condition) '[]'  - 0 Routine Hospital Admission (non-emergent condition) '[]'  - 0 New Admissions / Biomedical engineer / Ordering NPWT, Apligraf, etc. '[]'  - 0 Emergency Hospital Admission (emergent condition) PROCESS - Special Needs '[]'  - Pediatric / Minor Patient Management 0 '[]'  - 0 Isolation Patient Management '[]'  - 0 Hearing / Language / Visual special needs '[]'  - 0 Assessment of Community assistance (transportation, D/C planning, etc.) '[]'  - 0 Additional assistance / Altered mentation '[]'  - 0 Support Surface(s) Assessment (bed, cushion, seat, etc.) INTERVENTIONS - Miscellaneous '[]'  - External ear exam 0 '[]'  - 0 Patient Transfer (multiple staff / Civil Service fast streamer / Similar devices) '[]'  - 0 Simple Staple / Suture removal (25 or less) '[]'  - 0 Complex Staple / Suture removal (26 or more) '[]'  - 0 Hypo/Hyperglycemic Management (do not check if billed separately) '[]'  - 0 Ankle / Brachial Index (ABI) - do not check if billed separately Has the patient been seen at the hospital within the last three years: Yes Total Score: 0 Level Of Care: ____ [name] ([id number]) Electronic Signature(s) Signed: 12/15/2020 4:31:30  PM By: Donnamarie Poag Entered By: Donnamarie Poag on 12/15/2020 16:28:16 [name] ([id number]) -------------------------------------------------------------------------------- Compression Therapy Details Patient Name: [name] Date of Service: 12/15/2020 3:45 PM Medical Record Number: [id number] Patient Account Number: [id number] Date of Birth/Sex: 11-14-1950 (69 y.o. F) Treating RN: Donnamarie Poag Primary Care Zelpha Messing: [name] Other Clinician: Referring Dixie Jafri: [name] Treating Tabitha Riggins/Extender: [name] in Treatment: 21 Compression Therapy Performed for Wound Assessment: Wound #12 Left Calcaneus Performed By: Clinician Donnamarie Poag, RN Compression Type: Three Layer Post Procedure Diagnosis Same as Pre-procedure Electronic Signature(s) Signed: 12/15/2020 4:31:30 PM By: Donnamarie Poag Entered By: Donnamarie Poag on 12/15/2020 16:27:10 [name] ([id number]) -------------------------------------------------------------------------------- Encounter Discharge Information Details Patient Name: [name] Date of Service: 12/15/2020 3:45 PM Medical Record Number: [id number] Patient Account Number: [id number] Date of Birth/Sex: 1950-11-15 (69 y.o. F) Treating RN: Donnamarie Poag Primary Care Riot Barrick: [name] Other Clinician: Referring Mishaal Lansdale: [name] Treating Adeja Sarratt/Extender: [name] in Treatment: 21 Encounter Discharge Information Items Post Procedure Vitals Discharge Condition: Stable Temperature (F): 98.2 Ambulatory Status: Wheelchair Pulse (bpm): 77 Discharge Destination: Home Respiratory Rate (breaths/min): 18 Transportation: Private Auto Blood Pressure (mmHg): 114/53 Accompanied By: husband Schedule Follow-up Appointment: Yes Clinical Summary of Care: Electronic Signature(s) Signed: 12/15/2020 4:55:27 PM By: Donnamarie Poag Entered By: Donnamarie Poag on 12/15/2020 16:42:49 [name]  ([id number]) -------------------------------------------------------------------------------- Lower Extremity Assessment Details Patient Name: [name] Date of Service: 12/15/2020 3:45 PM Medical Record Number: [id number] Patient Account Number: [id number] Date of Birth/Sex: [birth year] (69 y.o. F) Treating RN: Donnamarie Poag Primary Care Petrina Melby: [name] Other Clinician: Referring Shallon Yaklin: [name] Treating Ryelle Ruvalcaba/Extender: [name] in Treatment: 21 Edema Assessment Assessed: [Left: Yes] [Right: No] [Left: Edema] [Right: :] Calf Left: Right: Point of Measurement: 29 cm From Medial Instep 31 cm Ankle Left: Right: Point of Measurement: 10 cm From Medial Instep 20.5 cm Vascular Assessment Pulses: Dorsalis Pedis Palpable: [Left:Yes] Electronic Signature(s) Signed: 12/15/2020 4:31:30 PM By: Donnamarie Poag Entered By: Donnamarie Poag on 12/15/2020 16:22:31 Leppert, Jaynie [name] ([id number]) -------------------------------------------------------------------------------- Multi Wound Chart Details Patient Name: [name] Date of Service: 12/15/2020 3:45 PM Medical Record Number: [id number] Patient Account Number: [id number] Date of Birth/Sex: 09-Jul-1950 (69 y.o. F) Treating RN: Donnamarie Poag Primary Care Everlina Gotts: [name] Other Clinician: Referring Lorrie Gargan: [name] Treating Renelda Kilian/Extender: [name] in Treatment: 21 Vital Signs Height(in): 26 Pulse(bpm): 70 Weight(lbs): 265 Blood Pressure(mmHg): 114/53 Body Mass Index(BMI): 45 Temperature(F): 98.2 Respiratory Rate(breaths/min): 18 Photos: [N/A:N/A] Wound Location: Left Calcaneus Left, Midline, Anterior Lower Leg N/A Wounding Event: Pressure Injury Shear/Friction N/A Primary Etiology: Pressure Ulcer Trauma, Other N/A Comorbid History: Cataracts, Chronic Obstructive Cataracts, Chronic Obstructive N/A Pulmonary Disease (COPD), Sleep Pulmonary Disease (COPD), Sleep Apnea,  Hypertension, Apnea, Hypertension, Osteoarthritis, Neuropathy Osteoarthritis, Neuropathy Date Acquired: 06/27/2020 11/24/2020 N/A Weeks of Treatment: 21 2 N/A Wound Status: Open Open N/A Measurements L x W x D (cm) 1.4x1.7x0.2 2.9x0.7x0.1 N/A Area (cm) : 1.869 1.594 N/A Volume (cm) : 0.374 0.159 N/A % Reduction in Area: 63.40% 61.00% N/A % Reduction in Volume: 63.40% 61.00% N/A Classification: Category/Stage III Partial Thickness N/A Exudate Amount: Medium Medium N/A Exudate Type: Serous Serosanguineous N/A Exudate Color: amber red, brown N/A Wound Margin: Thickened Indistinct, nonvisible N/A Granulation Amount: Large (67-100%) Large (67-100%) N/A Granulation Quality: Red, Pink Red N/A Necrotic Amount: Small (1-33%) Small (1-33%) N/A Exposed Structures: Fat Layer (Subcutaneous Tissue): Fat Layer (Subcutaneous Tissue): N/A Yes Yes Fascia: No Fascia: No Tendon: No Tendon: No Muscle: No Muscle: No Joint: No Joint: No Bone: No Bone: No Epithelialization: Small (1-33%) Medium (34-66%) N/A Treatment Notes Electronic Signature(s) Signed: 12/15/2020 4:31:30  PM By: Bishop, Joy Entered By: Donnamarie Poag on 12/15/2020 16:26:47 [name] ([id number]) -------------------------------------------------------------------------------- Multi-Disciplinary Care Plan Details Patient Name: PEARLINE, YERBY Date of Service: 12/15/2020 3:45 PM Medical Record Number: [id number] Patient Account Number: [id number] Date of Birth/Sex: [birth year] (69 y.o. F) Treating RN: Donnamarie Poag Primary Care Kida Digiulio: [name] Other Clinician: Referring Elleana Stillson: [name] Treating Brealyn Baril/Extender: [name] in Treatment: 21 Active Inactive Wound/Skin Impairment Nursing Diagnoses: Impaired tissue integrity Goals: Patient/caregiver will verbalize understanding of skin care regimen Date Initiated: 07/20/2020 Date Inactivated: 09/04/2020 Target Resolution Date: 09/19/2020 Goal  Status: Met Ulcer/skin breakdown will have a volume reduction of 30% by week 4 Date Initiated: 07/20/2020 Date Inactivated: 08/25/2020 Target Resolution Date: 08/20/2020 Goal Status: Met Ulcer/skin breakdown will have a volume reduction of 50% by week 8 Date Initiated: 07/20/2020 Date Inactivated: 10/09/2020 Target Resolution Date: 09/19/2020 Goal Status: Met Ulcer/skin breakdown will have a volume reduction of 80% by week 12 Date Initiated: 07/20/2020 Target Resolution Date: 10/20/2020 Goal Status: Active Ulcer/skin breakdown will heal within 14 weeks Date Initiated: 07/20/2020 Target Resolution Date: 11/19/2020 Goal Status: Active Interventions: Assess patient/caregiver ability to obtain necessary supplies Assess patient/caregiver ability to perform ulcer/skin care regimen upon admission and as needed Provide education on ulcer and skin care Treatment Activities: Referred to DME D[facility] for dressing supplies : 07/20/2020 Skin care regimen initiated : 07/20/2020 Notes: Electronic Signature(s) Signed: 12/15/2020 4:31:30 PM By: Donnamarie Poag Entered By: Donnamarie Poag on 12/15/2020 16:23:03 [name] ([id number]) -------------------------------------------------------------------------------- Pain Assessment Details Patient Name: [name] Date of Service: 12/15/2020 3:45 PM Medical Record Number: [id number] Patient Account Number: [id number] Date of Birth/Sex: March 06, 1951 (69 y.o. F) Treating RN: Donnamarie Poag Primary Care Irmgard Rampersaud: [name] Other Clinician: Referring Jaeson Molstad: [name] Treating Selina Tapper/Extender: [name] in Treatment: 21 Active Problems Location of Pain Severity and Description of Pain Patient Has Paino Yes Site Locations Pain Location: Generalized Pain, Pain in Ulcers Rate the pain. Current Pain Level: 5 Pain Management and Medication Current Pain Management: Electronic Signature(s) Signed: 12/15/2020 4:31:30 PM By: Donnamarie Poag Entered By: Donnamarie Poag on 12/15/2020 16:15:46 [name] ([id number]) -------------------------------------------------------------------------------- Patient/Caregiver Education Details Patient Name: [name] Date of Service: 12/15/2020 3:45 PM Medical Record Number: [id number] Patient Account Number: [id number] Date of Birth/Gender: 05/18/1950 (69 y.o. F) Treating RN: Donnamarie Poag Primary Care Physician: [name] Other Clinician: Referring Physician: [name] Treating Physician/Extender: [name] in Treatment: 21 Education Assessment Education Provided To: Patient Education Topics Provided Wound/Skin Impairment: Electronic Signature(s) Signed: 12/15/2020 4:31:30 PM By: Donnamarie Poag Entered By: Donnamarie Poag on 12/15/2020 16:30:43 Rodier, Jaynie [name] ([id number]) -------------------------------------------------------------------------------- Wound Assessment Details Patient Name: [name] Date of Service: 12/15/2020 3:45 PM Medical Record Number: [id number] Patient Account Number: [id number] Date of Birth/Sex: 1950-09-19 (69 y.o. F) Treating RN: Donnamarie Poag Primary Care Lyrick Lagrand: [name] Other Clinician: Referring Margerie Fraiser: [name] Treating Malaika Arnall/Extender: [name] in Treatment: 21 Wound Status Wound Number: 12 Primary Pressure Ulcer Etiology: Wound Location: Left Calcaneus Wound Open Wounding Event: Pressure Injury Status: Date Acquired: 06/27/2020 Comorbid Cataracts, Chronic Obstructive Pulmonary Disease Weeks Of Treatment: 21 History: (COPD), Sleep Apnea, Hypertension, Osteoarthritis, Clustered Wound: No Neuropathy Photos Wound Measurements Length: (cm) 1.4 Width: (cm) 1.7 Depth: (cm) 0.2 Area: (cm) 1.869 Volume: (cm) 0.374 % Reduction in Area: 63.4% % Reduction in Volume: 63.4% Epithelialization: Small (1-33%) Tunneling: No Undermining: No Wound Description Classification:  Category/Stage III Wound Margin: Thickened Exudate Amount: Medium Exudate Type: Serous Exudate Color: amber  Foul Odor After Cleansing: No Slough/Fibrino Yes Wound Bed Granulation Amount: Large (67-100%) Exposed Structure Granulation Quality: Red, Pink Fascia Exposed: No Necrotic Amount: Small (1-33%) Fat Layer (Subcutaneous Tissue) Exposed: Yes Necrotic Quality: Adherent Slough Tendon Exposed: No Muscle Exposed: No Joint Exposed: No Bone Exposed: No Treatment Notes Wound #12 (Calcaneus) Wound Laterality: Left Cleanser Soap and Water Discharge Instruction: Gently cleanse wound with antibacterial soap, rinse and pat dry prior to dressing wounds Mura, Pearla H. ([id number]) Peri-Wound Care Desitin Maximum Strength Ointment 4 (oz) Discharge Instruction: Apply periwound for protection Topical Primary Dressing Hydrofera Blue Ready Transfer Foam, 4x5 (in/in) Discharge Instruction: Apply to wound bed-cut hydrofera about 2 cm bigger around for absorption. Ensure hydrofera blue touches wound bed Secondary Dressing ABD Pad 5x9 (in/in) Discharge Instruction: Apply to secure hydrofera blue AND on shin area for protection Secured With Compression Wrap Profore Lite LF 3 Multilayer Compression Bandaging System Discharge Instruction: Apply 3 multi-layer wrap as prescribed. Compression Stockings Add-Ons Electronic Signature(s) Signed: 12/15/2020 4:31:30 PM By: Donnamarie Poag Entered By: Donnamarie Poag on 12/15/2020 16:20:31 [name] ([id number]) -------------------------------------------------------------------------------- Wound Assessment Details Patient Name: [name] Date of Service: 12/15/2020 3:45 PM Medical Record Number: [id number] Patient Account Number: [id number] Date of Birth/Sex: 03/02/1951 (69 y.o. F) Treating RN: Donnamarie Poag Primary Care Chrishon Martino: [name] Other Clinician: Referring Amry Cathy: [name] Treating [name] Bernath/Extender: [name] in Treatment: 21 Wound Status Wound Number: 13 Primary Trauma, Other Etiology: Wound Location: Left, Midline, Anterior Lower Leg Wound Open Wounding Event: Shear/Friction Status: Date Acquired: 11/24/2020 Comorbid Cataracts, Chronic Obstructive Pulmonary Disease Weeks Of Treatment: 2 History: (COPD), Sleep Apnea, Hypertension, Osteoarthritis, Clustered Wound: No Neuropathy Photos Wound Measurements Length: (cm) 2.9 Width: (cm) 0.7 Depth: (cm) 0.1 Area: (cm) 1.594 Volume: (cm) 0.159 % Reduction in Area: 61% % Reduction in Volume: 61% Epithelialization: Medium (34-66%) Tunneling: No Undermining: No Wound Description Classification: Partial Thickness Wound Margin: Indistinct, nonvisible Exudate Amount: Medium Exudate Type: Serosanguineous Exudate Color: red, brown Foul Odor After Cleansing: No Slough/Fibrino Yes Wound Bed Granulation Amount: Large (67-100%) Exposed Structure Granulation Quality: Red Fascia Exposed: No Necrotic Amount: Small (1-33%) Fat Layer (Subcutaneous Tissue) Exposed: Yes Necrotic Quality: Adherent Slough Tendon Exposed: No Muscle Exposed: No Joint Exposed: No Bone Exposed: No Treatment Notes Wound #13 (Lower Leg) Wound Laterality: Left, Midline, Anterior Cleanser Soap and Water Discharge Instruction: Gently cleanse wound with antibacterial soap, rinse and pat dry prior to dressing wounds Ghrist, Roselyn H. ([id number]) Peri-Wound Care Desitin Maximum Strength Ointment 4 (oz) Discharge Instruction: Apply periwound for protection Topical Primary Dressing Hydrofera Blue Ready Transfer Foam, 4x5 (in/in) Discharge Instruction: Apply to wound bed-cut hydrofera about 2 cm bigger around for absorption. Ensure hydrofera blue touches wound bed Secondary Dressing ABD Pad 5x9 (in/in) Discharge Instruction: Apply to secure hydrofera blue AND on shin area for protection Secured With Compression Wrap Profore Lite LF 3 Multilayer Compression  Bandaging System Discharge Instruction: Apply 3 multi-layer wrap as prescribed. Compression Stockings Add-Ons Electronic Signature(s) Signed: 12/15/2020 4:31:30 PM By: Donnamarie Poag Entered By: Donnamarie Poag on 12/15/2020 16:21:27 [name] ([id number]) -------------------------------------------------------------------------------- [place] Details Patient Name: [name] Date of Service: 12/15/2020 3:45 PM Medical Record Number: [id number] Patient Account Number: [id number] Date of Birth/Sex: 1950-11-08 (69 y.o. F) Treating RN: Donnamarie Poag Primary Care Neve Branscomb: [name] Other Clinician: Referring Jaceon Heiberger: [name] Treating Jeanann Balinski/Extender: [name] in Treatment: 21 Vital Signs Time Taken: 16:14 Temperature (F): 98.2 Height (in): 64 Pulse (bpm): 77 Weight (lbs): 265 Respiratory Rate (breaths/min):  18 Body Mass Index (BMI): 45.5 Blood Pressure (mmHg): 114/53 Reference Range: 80 - 120 mg / dl Electronic Signature(s) Signed: 12/15/2020 4:31:30 PM By: Donnamarie Poag Entered ByDonnamarie Poag on 12/15/2020 16:15:10

## 2020-12-15 NOTE — Progress Notes (Addendum)
[name], [name] ([id number]) Visit Report for 12/15/2020 Chief Complaint Document Details Patient Name: [name], [name] Date of Service: 12/15/2020 3:45 PM Medical Record Number: [id number] Patient Account Number: [id number] Date of Birth/Sex: [birth year] (70 y.o. F) Treating RN: Donnamarie Poag Primary Care Provider: [name] Other Clinician: Referring Provider: [name] Treating Provider/Extender: [name] in Treatment: 21 Information Obtained from: Patient Chief Complaint Left Heel Pressure Ulcer Electronic Signature(s) Signed: 12/15/2020 4:10:55 PM By: Worthy Keeler PA-C Entered By: Worthy Keeler on 12/15/2020 16:10:55 [name] ([id number]) -------------------------------------------------------------------------------- Debridement Details Patient Name: [name] Date of Service: 12/15/2020 3:45 PM Medical Record Number: [id number] Patient Account Number: [id number] Date of Birth/Sex: [birth year] (70 y.o. F) Treating RN: Donnamarie Poag Primary Care Provider: [name] Other Clinician: Referring Provider: [name] Treating Provider/Extender: [name] in Treatment: 21 Debridement Performed for Wound #12 Left Calcaneus Assessment: Performed By: Physician Tommie Sams., PA-C Debridement Type: Debridement Level of Consciousness (Pre- Awake and Alert procedure): Pre-procedure Verification/Time Out Yes - 16:27 Taken: Start Time: 16:27 Pain Control: Lidocaine Total Area Debrided (L x W): 1.5 (cm) x 1.8 (cm) = 2.7 (cm) Tissue and other material Viable, Non-Viable, Callus, Slough, Subcutaneous, Biofilm, Slough debrided: Level: Skin/Subcutaneous Tissue Debridement Description: Excisional Instrument: Curette Bleeding: Minimum Hemostasis Achieved: Pressure Response to Treatment: Procedure was tolerated well Level of Consciousness (Post- Awake and Alert procedure): Post Debridement Measurements of Total  Wound Length: (cm) 1.4 Stage: Category/Stage III Width: (cm) 1.7 Depth: (cm) 0.2 Volume: (cm) 0.374 Character of Wound/Ulcer Post Debridement: Improved Post Procedure Diagnosis Same as Pre-procedure Electronic Signature(s) Signed: 12/15/2020 4:31:30 PM By: Donnamarie Poag Signed: 12/15/2020 5:46:21 PM By: Worthy Keeler PA-C Entered By: Donnamarie Poag on 12/15/2020 16:29:15 Daversa, [name] ([id number]) -------------------------------------------------------------------------------- HPI Details Patient Name: [name] Date of Service: 12/15/2020 3:45 PM Medical Record Number: [id number] Patient Account Number: [id number] Date of Birth/Sex: 1950/12/02 (70 y.o. F) Treating RN: Donnamarie Poag Primary Care Provider: [name] Other Clinician: Referring Provider: [name] Treating Provider/Extender: [name] in Treatment: 21 History of Present Illness HPI Description: 70 year old with a past medical history significant for MS, urinary incontinence, and obesity. She has been seen in the wound clinic before for lower extremity ulcerations treated with compression therapy. she is also known to have hypertension, peripheral vascular disease, COPD, obstructive sleep apnea, lumbar radiculopathy, kyphoscoliosis, urinary issues and tobacco abuse. Smokes a packet of cigarettes a day was recently seen at the [facility] for swelling of her legs and feet with a ulceration on the dorsum of the right foot which has been there for about 6 months. she was recently in the ER about a month ago where she was seen for shortness of breath and swelling of the legs and a chest x-ray was within normal limits had an increase in her BNP and was given Lasix and put on doxycycline for a mild cellulitis and possible UTI. Wounds aresmaller today 11/13/15. Debrided and will continue Santyl. 12/21/2015 -- she was admitted to the hospital overnight on 12/18/2015 and diagnosed with  urinary retention and cellulitis of the left lower leg. is past to take clindamycin and use Santyl for her wounds. 01/15/2016 -- come back to see [place] for almost a month and continues to be noncompliant with her dressings 01/30/16 patient presents today for a follow-up visit concerning her ongoing bilateral lower extremity wounds. We have not seen her for the past 2 weeks although we are supposed to be seen her  for weekly visits. She currently has a Foley catheter. She tells me that the wounds are intensely painful especially with pressure and palpation at this point in time. Fortunately she is having no interval signs or symptoms of systemic infection but unfortunately the wounds have not improved dramatically since we last saw her. She does have home health coming to take care of her as well. She is currently not in any compression wraps. 02/06/16 ON evaluation today patient continues to experience discomfort regarding her bilateral lower extremity ulcers. She has continued to tolerate the dressing changes at this point in time and continues to have a Foley catheter as well. Fortunately she is back this week in the past she has been somewhat noncompliant with follow-ups I'm glad to see her today. She tells me that her pain level varies but can be as high as a 7 out of 10 with manipulation of the wound. She tells me that she used to be on oxycodone which was managed by the pain clinic although she is no longer on that as she tells me that she was actually smoking marijuana at the time and when this was found out they discontinued her pain medication. She no longer is taking anything pain medication wise and she also does not smoke cigarettes nor marijuana at this point. 02/12/16; this is a patient I don't believe I have previously seen. She has multiple sclerosis. She has 4 punched-out areas on the anterior lateral left leg 2 areas on the right these are all in the same condition. Reasonably small [dime  size] wounds each was some depth. Surface of these wounds does not look particularly healthy as there is adherent slough. There is no evidence of surrounding infection or inflammation. ABIs in this clinic were 0.87 on the right and 0.81 on the left. She is listed as having PAD and is a smoker. Not a diabetic 02/20/16 patient I gave antibiotics to last week for erythema around both wound sites on the left lateral leg and right lateral leg. This appears to be a lot better. One of the areas on the left leg is healed however she still has 3 punched-out open areas on the left lateral calf and one on the right lateral calf and one on the dorsal foot. She is an ex-smoker quitting 1 month ago. ABIs in this clinic were 0.87 on the right 0.81 on the left 03/12/16; this is a patient I really don't have a good sense of. It would appear for the first 5 or 6 weeks of her stay in this clinic she was cared for by Dr. Con Memos. She appeared on my schedule in mid October and I saw her twice. She has not been here however in 3 weeks. She has advanced Wound Care at home where she lives with her husband. She has multiple sclerosis. I saw her the first time she had 4 punched-out small wounds on the anterior lateral left leg it appears that she is now down to 2. She also had wounds on the lateral and medial aspect of her right leg which were also small and punched out. The only one that remains is on the right lateral. Because of the nature of her wound I went ahead and ordered formal arterial studies this showed an ABI of 1 on the right and one on the left. TBI of 1.4 on the right and 0.79 on the left. She had normal waveforms. She was in the emergency room on 02/28/16; there is a  noted edema of her left leg after a fall. She had a duplex ultrasound that showed no evidence for an acute DVT from the left groin to the popliteal fossa. The study was limited in the calf veins due to edema. Also noticeable that she had a left  inguinal enlarged lymph node up to 6.2 cm. She also had a left knee x-ray that showed no acute findings and a right foot x-ray that showed soft tissue swelling but no radiographic evidence of osteomyelitis. The patient does stop smoking 03-19-16 Ms Hege presents today for evaluation of her bilateral lower extremity ulcerations. She states that she has not smoked in several weeks. She denies any pain or discomfort to bilateral lower extremities, tolerating compression therapy as ordered. 03/26/16; I have not seen this patient in 2 weeks however she has done very well with improvements in the areas in question. After we obtain normal arterial studies, increasing the 4-layer compression really seems to look done a nice job here. She has one open area on the left lateral leg and one on the medial right leg. These have filled in nicely and are now superficial wounds that showed epithelialized. I note the left inguinal lymph node at 6.2 cm. I may need to refer this back to the patient's primary doctor. 04/03/16; patient has 2 remaining wounds 1 on the left lateral leg and one on the right medial leg. Both of filled in nicely since we are able to increase her from a 3 layer to a 4 layer compression. I am also following up on the lymph node notable on her left leg DVT rule out in November 04/10/16; area on the right lateral leg is healed. The area on the left leg is still open but looks considerably better with healthy granulation and less wound area. I12/20/17; last week we healed the patient doubt with regards to the wound on the right leg to her on compression stocking 20-30 mmHg as it turns out I don't think she had a stocking, predictably therefore she has reopened. The left leg as well as the wound on the lateral aspect. Been generally small line 04/24/16; we healed out her right leg 2 weeks ago to her own 20-30 mm compression stockings although as it turns out she didn't have these and did not  purchase some apparently because of financial issues. The left leg wound is on the lateral aspect. Both of these wounds are just about healed. 05/01/16; her wounds on the left leg are healed out today. The area on the right leg is also healed. Vitamin diligent effort of our staff we have not been able to get any form of compression stocking to this patient. The orders for juxtalite's were given to home health this never materialized. We ordered compression stockings I don't think she was able to afford these. We had a discussion today with both the patient and her husband without these, will accumulate edema and the wounds will simply reopen again. 05/14/16 READMISSION Carrera, [place] ([id number]) this is a patient we healed out 2 weeks ago. As bilateral lower extremity venous wounds probably some degree of lymphedema. At the beginning in November I did a duplex ultrasound of her left leg that was negative for DVT but did show a lymph node in the left inguinal area presumably reactive. We've been using compression to her lower extremities eventually healed all her wounds on her bilateral calves but we were not able to get any form of compression stockings for her  in spite of intense efforts of our staff. She returns today with reopening on the left leg did not the right she has several superficial areas anteriorly but an actual frank ulcer on the lateral left calf. This is about the size of a dime or smaller. She does not really complain of pain. The patient has a history of PAD however arterial studies we did in November were normal. Her ABIs and Doppler waveforms were both within the normal limits. The patient has a history of MS. In discussing things with her today it would appear that the opening was noted 2 weeks ago according to the patient. We gave her Tubigrip stockings when she left the clinic 05/21/16; she has not yet had the duplex ultrasound of her thigh, this is booked for Thursday. The wound  on the left lateral lower leg is improved 05/28/16; her duplex ultrasound of the left leg specifically her left thigh did not show a DVT however did show hypoechogenic enlarged left inguinal lymph nodes up to 6.9 x 3.7 x 6.8. This also showed in the one in November however maximal diameter was 6.2, at that point I thought these were reactive secondary to cellulitis however there is obviously a differential here. Her wound on the left lower leg is closed. She has a superficial open area on the base of her left fifth metatarsal head on the plantar foot. It looks as though she has some wrap injuries on the anterior leg 06/05/16; since she was last year she had a fall on 1/31. She was seen in the ER but sent back home. I think she is also been seen by her primary physician who picked up on the swelling and the lymphadenopathy. She is ordered a CT scan of the abdomen and pelvis however this will be without contrast because of stage IIIB renal insufficiency. Nevertheless this should be a start the workup to make sure there is not is more systemic problem here. She will probably need consideration of a biopsy of the lymph node in her inguinal area, this would need general surgery. As far as her wounds are concern today the area on the left plantar foot is healed. She has a small weeping area on the left lower leg. The wrap injury from last week has largely Pardoxically there is a lot less edema in the left thigh. 06/12/16; CT scan is on Friday. She has 2 small open areas one on the lateral left lower leg and one on the anterior lower leg on the left. Both of these looks healthy. I reduced her compression last week to Kerlix and Coban this seems to have not a reasonable job 07/02/16; the patient is now in the skilled facility. I think this was arranged out of the ER when she fell at home although I'm not exactly sure. She is going for I think a laparotomy for the pelvic mass next week. All the wounds in the left leg  have healed Readmission: 07/20/2020 upon evaluation today patient presents for reevaluation here in clinic unfortunately she is having issues with a significant stage III pressure ulcer to her left heel. This is something that occurred when she was in the hospital sick. And subsequently though she does not appear to have any new injury she is still trying to recover from the pressure injury which was quite severe during that time. I do not see any signs of infection right now which is great news. With that being said she has been require some  debridement to try to clear this up as much as possible. We need to get the wound VAC cleared so that she can see overall healing and improvement that were looking for. The patient does have a history of hypertension, chronic kidney disease stage III, COPD, and multiple sclerosis. Currently she has been trying to do what she can to keep pressure off of her heel while in bed and in other locations as well. 08/04/20 upon evaluation today patient appears to be doing extremely well currently in regard to her wound on the heel. I do believe the Iodoflex has helped loosen things appear. Unfortunately she tells me that the home health nurse has not been putting the Iodoflex on the wound. That is news to [place] and that they tell me they got supplies in the mail but they did not see the Iodoflex being used. Nonetheless I am wondering if this is just something that they missed or if it truly has not been. I did tell them to look when they get home in their box of supplies to see if they saw the dressing which showed and what it should look like. 08/11/2020 upon evaluation today patient appears to be doing decently well in regard to her wound although unfortunately this is showing signs of some blue-green drainage consistent with Pseudomonas it does have a little worried in this regard. Fortunately there is no evidence of active infection which is great news. No fevers, chills,  nausea, vomiting, or diarrhea. 08/18/2020 upon evaluation today patient appears to be doing well with regard to her wound. She has been tolerating the dressing changes without complication. Fortunately there is no signs of active infection at this time. No fevers, chills, nausea, vomiting, or diarrhea. 4/29; left heel wound pressure ulcer. Slightly smaller. 09/04/2020 upon evaluation today patient appears to be doing well with regard to her wound. This is actually on her heel and seems to be doing well other than the fact that is a little bit moist from excessive drainage. There does not appear to be any signs of active infection at this time. No fevers, chills, nausea, vomiting, or diarrhea.. 09/11/2020 upon evaluation today patient appears to be doing okay in regard to her wound. With that being said she still has quite a bit of drainage noted at this point. Fortunately there is no signs of active infection which is great news overall I am extremely pleased with where things stand today. I do think we need to try to do something a little bit more to help control the drainage a bit better. 5/23; left posterior heel. This actually looks quite good. Using Hydrofera Blue under compression. Dimensions are smaller 09/26/2020 upon evaluation today patient appears to be doing well with regard to her heel ulcer. In fact this is showing signs of excellent improvement and overall I am extremely pleased with where things stand today. I do not see any evidence of infection which is great news and overall I feel like she is headed in the correct direction. 10/09/2020 upon evaluation today patient appears to be doing excellent in regard to her wound. She has been tolerating the dressing changes without complication. Fortunately there is no evidence of active infection which is also excellent news. No fevers, chills, nausea, vomiting, or diarrhea. 10/16/2020 upon evaluation today patient appears to be doing well with  regard to her heel ulcer. Unfortunately she is having issues with an area of deep tissue injury/pressure injury over the shin due to the wrap being  on way too tight today. Fortunately there does not appear to be any signs of infection currently. No fevers, chills, nausea, vomiting, or diarrhea. 10/23/2020 upon evaluation today patient appears to be doing better in regard to her wound. Fortunately there does not appear to be any signs of active infection at this time which is great news. In general I am extremely pleased with where things stand at this point. The patient does not show any signs of worsening which is excellent. She unfortunately still has the area in question with regard to the region over the anterior portion of her shin where she did have some deep tissue injury. Some of this is starting to open up a little bit. With that being said I think the biggest thing we need to do at this point is to try to as much as possible monitor for any additional breakdown and protect the region while it heals up. I think it is actually doing quite well which is great news. Fortunately there does not appear to be any evidence of active infection 11/14/2018 upon evaluation today patient appears to be doing okay in regard to her ischial. Her leg is much more swollen though she has not had a wrap on any type of dressing for some time when home health came out they did not have the Tubigrip. We have not actually seen her since June 27. The patient when I asked her about it seemed to get somewhat upset to be honest. She seems to be having some trouble at home. She states that she had to "raise hell" just to be able to get here to this appointment today. With that being said I really do think she needs to have Ryland, Arly H. ([id number]) this wraps and keep the swelling under control and was doing a whole lot better but now seems to be not doing quite as well to be honest. The anterior portion of her shin does  seem to be healing and doing better although it did end up opening into the wound. Again that was where home health the wrap does have a little bit too tight causing this issue previously. 11/27/2020 upon evaluation today patient appears to be doing poorly in regard to her anterior shin where she did have some area where the wrap actually aggravated the spot. With that being said I think that we do need to make sure to pad this extremely well there is already some signs of healing at this point. I did believe this was probably can open even when I saw it last week and to be honest it has opened. The heel also is showing signs of improvement I do see some slough noted but nothing too significant which is great news. No fevers, chills, nausea, vomiting, or diarrhea. 12/07/2020 upon evaluation today patient appears to be doing decently well in regard to her wounds. Both are showing signs of some slough and biofilm buildup on the surface of the wounds. Fortunately there is no signs of active infection at this time which is great news. No fevers, chills, nausea, vomiting, or diarrhea. 12/15/2020 upon evaluation today patient appears to be doing well with regard to her wounds. She is showing signs of improvement at both locations which is great news. Fortunately I do not see any evidence of active infection at this time which is great as well. Electronic Signature(s) Signed: 12/15/2020 5:17:15 PM By: Worthy Keeler PA-C Entered By: Worthy Keeler on 12/15/2020 17:17:15 Brobeck, Philisha H. (  [id number]) -------------------------------------------------------------------------------- Physical Exam Details Patient Name: SHATEKA, GOETTING Date of Service: 12/15/2020 3:45 PM Medical Record Number: [id number] Patient Account Number: [id number] Date of Birth/Sex: 1950/12/11 (70 y.o. F) Treating RN: Donnamarie Poag Primary Care Provider: [name] Other Clinician: Referring Provider: [name] Treating  Provider/Extender: [name] in Treatment: 21 Constitutional Well-nourished and well-hydrated in no acute distress. Respiratory normal breathing without difficulty. Psychiatric this patient is able to make decisions and demonstrates good insight into disease process. Alert and Oriented x 3. pleasant and cooperative. Notes Upon inspection patient's wound bed showed signs of good granulation epithelization at this point. Fortunately there is no evidence of infection at this point and I think that the patient is making good headway here as far as both wounds are concerned I do not see any signs of backtracking which is also great news. Electronic Signature(s) Signed: 12/15/2020 5:17:33 PM By: Worthy Keeler PA-C Entered By: Worthy Keeler on 12/15/2020 17:17:33 Levey, [name] ([id number]) -------------------------------------------------------------------------------- Physician Orders Details Patient Name: [name] Date of Service: 12/15/2020 3:45 PM Medical Record Number: [id number] Patient Account Number: [id number] Date of Birth/Sex: 04-May-1950 (70 y.o. F) Treating RN: Donnamarie Poag Primary Care Provider: [name] Other Clinician: Referring Provider: [name] Treating Provider/Extender: [name] in Treatment: 21 Verbal / Phone Orders: No Diagnosis Coding ICD-10 Coding Code Description [phone] Pressure ulcer of left heel, stage 3 L97.822 Non-pressure chronic ulcer of other part of left lower leg with fat layer exposed I10 Essential (primary) hypertension N18.30 Chronic kidney disease, stage 3 unspecified J44.9 Chronic obstructive pulmonary disease, unspecified G35 Multiple sclerosis Follow-up Appointments o Return Appointment in 1 week. [place]: - [facility] for wound care. May utilize formulary equivalent dressing for wound treatment orders unless otherwise specified. Home Health  Nurse may visit PR[facility] to address patientos wound care needs. o Scheduled days for dressing changes to be completed; exception, patient has scheduled wound care visit that day. o **Please direct any NON-WOUND related issues/requests for orders to patient's Primary Care Physician. **If current dressing causes regression in wound condition, may D/C ordered dressing product/s and apply Normal Saline Moist Dressing daily until next [place] or Other MD appointment. **Notify [facility]aling Center of regression in wound condition at [phone]. Edema Control - Lymphedema / Segmental Compressive Device / Other Bilateral Lower Extremities o Elevate, Exercise Daily and Avoid Standing for Long Periods of Time. o Elevate legs to the level of the heart and pump ankles as often as possible - ELEVATE LEGS o Elevate leg(s) parallel to the floor when sitting. Off-Loading Left Lower Extremity o Open toe surgical shoe o Turn and reposition every 2 hours o Other: - Keep pressure off left heel Wound Treatment Wound #12 - Calcaneus Wound Laterality: Left Cleanser: Soap and Water 2 x Per Week/30 Days Discharge Instructions: Gently cleanse wound with antibacterial soap, rinse and pat dry prior to dressing wounds Peri-Wound Care: Desitin Maximum Strength Ointment 4 (oz) 2 x Per Week/30 Days Discharge Instructions: Apply periwound for protection Primary Dressing: Hydrofera Blue Ready Transfer Foam, 4x5 (in/in) (Generic) 2 x Per Week/30 Days Discharge Instructions: Apply to wound bed-cut hydrofera about 2 cm bigger around for absorption. Ensure hydrofera blue touches wound bed Secondary Dressing: ABD Pad 5x9 (in/in) 2 x Per Week/30 Days Discharge Instructions: Apply to secure hydrofera blue AND on shin area for protection Compression Wrap: Profore Lite LF 3 Multilayer Compression Bandaging System 2 x Per  Week/30 Days Discharge Instructions: Apply 3 multi-layer wrap as prescribed. Wound  #13 - Lower Leg Wound Laterality: Left, Midline, Anterior Cleanser: Soap and Water 2 x Per Week/30 Days MARY-ANN, WOZNY ([id number]) Discharge Instructions: Gently cleanse wound with antibacterial soap, rinse and pat dry prior to dressing wounds Peri-Wound Care: Desitin Maximum Strength Ointment 4 (oz) 2 x Per Week/30 Days Discharge Instructions: Apply periwound for protection Primary Dressing: Hydrofera Blue Ready Transfer Foam, 4x5 (in/in) (Generic) 2 x Per Week/30 Days Discharge Instructions: Apply to wound bed-cut hydrofera about 2 cm bigger around for absorption. Ensure hydrofera blue touches wound bed Secondary Dressing: ABD Pad 5x9 (in/in) 2 x Per Week/30 Days Discharge Instructions: Apply to secure hydrofera blue AND on shin area for protection Compression Wrap: Profore Lite LF 3 Multilayer Compression Bandaging System 2 x Per Week/30 Days Discharge Instructions: Apply 3 multi-layer wrap as prescribed. Electronic Signature(s) Signed: 12/15/2020 4:31:30 PM By: Donnamarie Poag Signed: 12/15/2020 5:46:21 PM By: Worthy Keeler PA-C Entered By: Donnamarie Poag on 12/15/2020 16:28:02 [name] ([id number]) -------------------------------------------------------------------------------- Problem List Details Patient Name: [name] Date of Service: 12/15/2020 3:45 PM Medical Record Number: [id number] Patient Account Number: [id number] Date of Birth/Sex: 04-28-1951 (70 y.o. F) Treating RN: Donnamarie Poag Primary Care Provider: [name] Other Clinician: Referring Provider: [name] Treating Provider/Extender: [name] in Treatment: 21 Active Problems ICD-10 Encounter Code Description Active Date MDM Diagnosis [phone] Pressure ulcer of left heel, stage 3 07/20/2020 No Yes L97.822 Non-pressure chronic ulcer of other part of left lower leg with fat layer 11/27/2020 No Yes exposed [place] (primary) hypertension 07/20/2020 No Yes N18.30 Chronic kidney disease,  stage 3 unspecified 07/20/2020 No Yes J44.9 Chronic obstructive pulmonary disease, unspecified 07/20/2020 No Yes G35 Multiple sclerosis 07/20/2020 No Yes Inactive Problems Resolved Problems Electronic Signature(s) Signed: 12/15/2020 4:10:43 PM By: Worthy Keeler PA-C Entered By: Worthy Keeler on 12/15/2020 16:10:43 Friesen, [name] ([id number]) -------------------------------------------------------------------------------- Progress Note Details Patient Name: [name] Date of Service: 12/15/2020 3:45 PM Medical Record Number: [id number] Patient Account Number: [id number] Date of Birth/Sex: May 10, 1950 (70 y.o. F) Treating RN: Donnamarie Poag Primary Care Provider: [name] Other Clinician: Referring Provider: [name] Treating Provider/Extender: [name] in Treatment: 21 Subjective Chief Complaint Information obtained from Patient Left Heel Pressure Ulcer History of Present Illness (HPI) 70 year old with a past medical history significant for MS, urinary incontinence, and obesity. She has been seen in the wound clinic before for lower extremity ulcerations treated with compression therapy. she is also known to have hypertension, peripheral vascular disease, COPD, obstructive sleep apnea, lumbar radiculopathy, kyphoscoliosis, urinary issues and tobacco abuse. Smokes a packet of cigarettes a day was recently seen at the [facility] for swelling of her legs and feet with a ulceration on the dorsum of the right foot which has been there for about 6 months. she was recently in the ER about a month ago where she was seen for shortness of breath and swelling of the legs and a chest x-ray was within normal limits had an increase in her BNP and was given Lasix and put on doxycycline for a mild cellulitis and possible UTI. Wounds aresmaller today 11/13/15. Debrided and will continue Santyl. 12/21/2015 -- she was admitted to the hospital overnight on  12/18/2015 and diagnosed with urinary retention and cellulitis of the left lower leg. is past to take clindamycin and use Santyl for her wounds. 01/15/2016 -- come back to see [place] for almost a month  and continues to be noncompliant with her dressings 01/30/16 patient presents today for a follow-up visit concerning her ongoing bilateral lower extremity wounds. We have not seen her for the past 2 weeks although we are supposed to be seen her for weekly visits. She currently has a Foley catheter. She tells me that the wounds are intensely painful especially with pressure and palpation at this point in time. Fortunately she is having no interval signs or symptoms of systemic infection but unfortunately the wounds have not improved dramatically since we last saw her. She does have home health coming to take care of her as well. She is currently not in any compression wraps. 02/06/16 ON evaluation today patient continues to experience discomfort regarding her bilateral lower extremity ulcers. She has continued to tolerate the dressing changes at this point in time and continues to have a Foley catheter as well. Fortunately she is back this week in the past she has been somewhat noncompliant with follow-ups I'm glad to see her today. She tells me that her pain level varies but can be as high as a 7 out of 10 with manipulation of the wound. She tells me that she used to be on oxycodone which was managed by the pain clinic although she is no longer on that as she tells me that she was actually smoking marijuana at the time and when this was found out they discontinued her pain medication. She no longer is taking anything pain medication wise and she also does not smoke cigarettes nor marijuana at this point. 02/12/16; this is a patient I don't believe I have previously seen. She has multiple sclerosis. She has 4 punched-out areas on the anterior lateral left leg 2 areas on the right these are all in the same  condition. Reasonably small [dime size] wounds each was some depth. Surface of these wounds does not look particularly healthy as there is adherent slough. There is no evidence of surrounding infection or inflammation. ABIs in this clinic were 0.87 on the right and 0.81 on the left. She is listed as having PAD and is a smoker. Not a diabetic 02/20/16 patient I gave antibiotics to last week for erythema around both wound sites on the left lateral leg and right lateral leg. This appears to be a lot better. One of the areas on the left leg is healed however she still has 3 punched-out open areas on the left lateral calf and one on the right lateral calf and one on the dorsal foot. She is an ex-smoker quitting 1 month ago. ABIs in this clinic were 0.87 on the right 0.81 on the left 03/12/16; this is a patient I really don't have a good sense of. It would appear for the first 5 or 6 weeks of her stay in this clinic she was cared for by Dr. Con Memos. She appeared on my schedule in mid October and I saw her twice. She has not been here however in 3 weeks. She has advanced Wound Care at home where she lives with her husband. She has multiple sclerosis. I saw her the first time she had 4 punched-out small wounds on the anterior lateral left leg it appears that she is now down to 2. She also had wounds on the lateral and medial aspect of her right leg which were also small and punched out. The only one that remains is on the right lateral. Because of the nature of her wound I went ahead and ordered formal arterial  studies this showed an ABI of 1 on the right and one on the left. TBI of 1.4 on the right and 0.79 on the left. She had normal waveforms. She was in the emergency room on 02/28/16; there is a noted edema of her left leg after a fall. She had a duplex ultrasound that showed no evidence for an acute DVT from the left groin to the popliteal fossa. The study was limited in the calf veins due to edema.  Also noticeable that she had a left inguinal enlarged lymph node up to 6.2 cm. She also had a left knee x-ray that showed no acute findings and a right foot x-ray that showed soft tissue swelling but no radiographic evidence of osteomyelitis. The patient does stop smoking 03-19-16 Ms Full presents today for evaluation of her bilateral lower extremity ulcerations. She states that she has not smoked in several weeks. She denies any pain or discomfort to bilateral lower extremities, tolerating compression therapy as ordered. 03/26/16; I have not seen this patient in 2 weeks however she has done very well with improvements in the areas in question. After we obtain normal arterial studies, increasing the 4-layer compression really seems to look done a nice job here. She has one open area on the left lateral leg and one on the medial right leg. These have filled in nicely and are now superficial wounds that showed epithelialized. I note the left inguinal lymph node at 6.2 cm. I may need to refer this back to the patient's primary doctor. 04/03/16; patient has 2 remaining wounds 1 on the left lateral leg and one on the right medial leg. Both of filled in nicely since we are able to increase her from a 3 layer to a 4 layer compression. I am also following up on the lymph node notable on her left leg DVT rule out in November 04/10/16; area on the right lateral leg is healed. The area on the left leg is still open but looks considerably better with healthy granulation and less wound area. I12/20/17; last week we healed the patient doubt with regards to the wound on the right leg to her on compression stocking 20-30 mmHg as it turns out I don't think she had a stocking, predictably therefore she has reopened. The left leg as well as the wound on the lateral aspect. Been generally small line 04/24/16; we healed out her right leg 2 weeks ago to her own 20-30 mm compression stockings although as it turns out she  didn't have these and did not purchase some apparently because of financial issues. The left leg wound is on the lateral aspect. Both of these wounds are just about healed. 05/01/16; her wounds on the left leg are healed out today. The area on the right leg is also healed. Vitamin diligent effort of our staff we have not been able to get any form of compression stocking to this patient. The orders for juxtalite's were given to home health this never materialized. We Mcdanel, Jentri H. ([id number]) ordered compression stockings I don't think she was able to afford these. We had a discussion today with both the patient and her husband without these, will accumulate edema and the wounds will simply reopen again. 05/14/16 READMISSION this is a patient we healed out 2 weeks ago. As bilateral lower extremity venous wounds probably some degree of lymphedema. At the beginning in November I did a duplex ultrasound of her left leg that was negative for DVT but did show  a lymph node in the left inguinal area presumably reactive. We've been using compression to her lower extremities eventually healed all her wounds on her bilateral calves but we were not able to get any form of compression stockings for her in spite of intense efforts of our staff. She returns today with reopening on the left leg did not the right she has several superficial areas anteriorly but an actual frank ulcer on the lateral left calf. This is about the size of a dime or smaller. She does not really complain of pain. The patient has a history of PAD however arterial studies we did in November were normal. Her ABIs and Doppler waveforms were both within the normal limits. The patient has a history of MS. In discussing things with her today it would appear that the opening was noted 2 weeks ago according to the patient. We gave her Tubigrip stockings when she left the clinic 05/21/16; she has not yet had the duplex ultrasound of her thigh, this is  booked for Thursday. The wound on the left lateral lower leg is improved 05/28/16; her duplex ultrasound of the left leg specifically her left thigh did not show a DVT however did show hypoechogenic enlarged left inguinal lymph nodes up to 6.9 x 3.7 x 6.8. This also showed in the one in November however maximal diameter was 6.2, at that point I thought these were reactive secondary to cellulitis however there is obviously a differential here. Her wound on the left lower leg is closed. She has a superficial open area on the base of her left fifth metatarsal head on the plantar foot. It looks as though she has some wrap injuries on the anterior leg 06/05/16; since she was last year she had a fall on 1/31. She was seen in the ER but sent back home. I think she is also been seen by her primary physician who picked up on the swelling and the lymphadenopathy. She is ordered a CT scan of the abdomen and pelvis however this will be without contrast because of stage IIIB renal insufficiency. Nevertheless this should be a start the workup to make sure there is not is more systemic problem here. She will probably need consideration of a biopsy of the lymph node in her inguinal area, this would need general surgery. As far as her wounds are concern today the area on the left plantar foot is healed. She has a small weeping area on the left lower leg. The wrap injury from last week has largely Pardoxically there is a lot less edema in the left thigh. 06/12/16; CT scan is on Friday. She has 2 small open areas one on the lateral left lower leg and one on the anterior lower leg on the left. Both of these looks healthy. I reduced her compression last week to Kerlix and Coban this seems to have not a reasonable job 07/02/16; the patient is now in the skilled facility. I think this was arranged out of the ER when she fell at home although I'm not exactly sure. She is going for I think a laparotomy for the pelvic mass next week.  All the wounds in the left leg have healed Readmission: 07/20/2020 upon evaluation today patient presents for reevaluation here in clinic unfortunately she is having issues with a significant stage III pressure ulcer to her left heel. This is something that occurred when she was in the hospital sick. And subsequently though she does not appear to have any new  injury she is still trying to recover from the pressure injury which was quite severe during that time. I do not see any signs of infection right now which is great news. With that being said she has been require some debridement to try to clear this up as much as possible. We need to get the wound VAC cleared so that she can see overall healing and improvement that were looking for. The patient does have a history of hypertension, chronic kidney disease stage III, COPD, and multiple sclerosis. Currently she has been trying to do what she can to keep pressure off of her heel while in bed and in other locations as well. 08/04/20 upon evaluation today patient appears to be doing extremely well currently in regard to her wound on the heel. I do believe the Iodoflex has helped loosen things appear. Unfortunately she tells me that the home health nurse has not been putting the Iodoflex on the wound. That is news to [place] and that they tell me they got supplies in the mail but they did not see the Iodoflex being used. Nonetheless I am wondering if this is just something that they missed or if it truly has not been. I did tell them to look when they get home in their box of supplies to see if they saw the dressing which showed and what it should look like. 08/11/2020 upon evaluation today patient appears to be doing decently well in regard to her wound although unfortunately this is showing signs of some blue-green drainage consistent with Pseudomonas it does have a little worried in this regard. Fortunately there is no evidence of active infection which is  great news. No fevers, chills, nausea, vomiting, or diarrhea. 08/18/2020 upon evaluation today patient appears to be doing well with regard to her wound. She has been tolerating the dressing changes without complication. Fortunately there is no signs of active infection at this time. No fevers, chills, nausea, vomiting, or diarrhea. 4/29; left heel wound pressure ulcer. Slightly smaller. 09/04/2020 upon evaluation today patient appears to be doing well with regard to her wound. This is actually on her heel and seems to be doing well other than the fact that is a little bit moist from excessive drainage. There does not appear to be any signs of active infection at this time. No fevers, chills, nausea, vomiting, or diarrhea.. 09/11/2020 upon evaluation today patient appears to be doing okay in regard to her wound. With that being said she still has quite a bit of drainage noted at this point. Fortunately there is no signs of active infection which is great news overall I am extremely pleased with where things stand today. I do think we need to try to do something a little bit more to help control the drainage a bit better. 5/23; left posterior heel. This actually looks quite good. Using Hydrofera Blue under compression. Dimensions are smaller 09/26/2020 upon evaluation today patient appears to be doing well with regard to her heel ulcer. In fact this is showing signs of excellent improvement and overall I am extremely pleased with where things stand today. I do not see any evidence of infection which is great news and overall I feel like she is headed in the correct direction. 10/09/2020 upon evaluation today patient appears to be doing excellent in regard to her wound. She has been tolerating the dressing changes without complication. Fortunately there is no evidence of active infection which is also excellent news. No fevers, chills, nausea,  vomiting, or diarrhea. 10/16/2020 upon evaluation today patient  appears to be doing well with regard to her heel ulcer. Unfortunately she is having issues with an area of deep tissue injury/pressure injury over the shin due to the wrap being on way too tight today. Fortunately there does not appear to be any signs of infection currently. No fevers, chills, nausea, vomiting, or diarrhea. 10/23/2020 upon evaluation today patient appears to be doing better in regard to her wound. Fortunately there does not appear to be any signs of active infection at this time which is great news. In general I am extremely pleased with where things stand at this point. The patient does not show any signs of worsening which is excellent. She unfortunately still has the area in question with regard to the region over the anterior portion of her shin where she did have some deep tissue injury. Some of this is starting to open up a little bit. With that being said I think the biggest thing we need to do at this point is to try to as much as possible monitor for any additional breakdown and protect the region while it heals up. I think it is actually doing quite well which is great news. Fortunately there does not appear to be any evidence of active infection RITAL, CASALETTO. ([id number]) 11/14/2018 upon evaluation today patient appears to be doing okay in regard to her ischial. Her leg is much more swollen though she has not had a wrap on any type of dressing for some time when home health came out they did not have the Tubigrip. We have not actually seen her since June 27. The patient when I asked her about it seemed to get somewhat upset to be honest. She seems to be having some trouble at home. She states that she had to "raise hell" just to be able to get here to this appointment today. With that being said I really do think she needs to have this wraps and keep the swelling under control and was doing a whole lot better but now seems to be not doing quite as well to be honest.  The anterior portion of her shin does seem to be healing and doing better although it did end up opening into the wound. Again that was where home health the wrap does have a little bit too tight causing this issue previously. 11/27/2020 upon evaluation today patient appears to be doing poorly in regard to her anterior shin where she did have some area where the wrap actually aggravated the spot. With that being said I think that we do need to make sure to pad this extremely well there is already some signs of healing at this point. I did believe this was probably can open even when I saw it last week and to be honest it has opened. The heel also is showing signs of improvement I do see some slough noted but nothing too significant which is great news. No fevers, chills, nausea, vomiting, or diarrhea. 12/07/2020 upon evaluation today patient appears to be doing decently well in regard to her wounds. Both are showing signs of some slough and biofilm buildup on the surface of the wounds. Fortunately there is no signs of active infection at this time which is great news. No fevers, chills, nausea, vomiting, or diarrhea. 12/15/2020 upon evaluation today patient appears to be doing well with regard to her wounds. She is showing signs of improvement at both locations which is great  news. Fortunately I do not see any evidence of active infection at this time which is great as well. Objective Constitutional Well-nourished and well-hydrated in no acute distress. Vitals Time Taken: 4:14 PM, Height: 64 in, Weight: 265 lbs, BMI: 45.5, Temperature: 98.2 F, Pulse: 77 bpm, Respiratory Rate: 18 breaths/min, Blood Pressure: 114/53 mmHg. Respiratory normal breathing without difficulty. Psychiatric this patient is able to make decisions and demonstrates good insight into disease process. Alert and Oriented x 3. pleasant and cooperative. General Notes: Upon inspection patient's wound bed showed signs of good  granulation epithelization at this point. Fortunately there is no evidence of infection at this point and I think that the patient is making good headway here as far as both wounds are concerned I do not see any signs of backtracking which is also great news. Integumentary (Hair, Skin) Wound #12 status is Open. Original cause of wound was Pressure Injury. The date acquired was: 06/27/2020. The wound has been in treatment 21 weeks. The wound is located on the Left Calcaneus. The wound measures 1.4cm length x 1.7cm width x 0.2cm depth; 1.869cm^2 area and 0.374cm^3 volume. There is Fat Layer (Subcutaneous Tissue) exposed. There is no tunneling or undermining noted. There is a medium amount of serous drainage noted. The wound margin is thickened. There is large (67-100%) red, pink granulation within the wound bed. There is a small (1-33%) amount of necrotic tissue within the wound bed including Adherent Slough. Wound #13 status is Open. Original cause of wound was Shear/Friction. The date acquired was: 11/24/2020. The wound has been in treatment 2 weeks. The wound is located on the Left,Midline,Anterior Lower Leg. The wound measures 2.9cm length x 0.7cm width x 0.1cm depth; 1.594cm^2 area and 0.159cm^3 volume. There is Fat Layer (Subcutaneous Tissue) exposed. There is no tunneling or undermining noted. There is a medium amount of serosanguineous drainage noted. The wound margin is indistinct and nonvisible. There is large (67-100%) red granulation within the wound bed. There is a small (1-33%) amount of necrotic tissue within the wound bed including Adherent Slough. Assessment Active Problems ICD-10 Pressure ulcer of left heel, stage 3 Non-pressure chronic ulcer of other part of left lower leg with fat layer exposed Essential (primary) hypertension Chronic kidney disease, stage 3 unspecified Chronic obstructive pulmonary disease, unspecified Multiple sclerosis [place], Cloma H.  ([id number]) Procedures Wound #12 Pre-procedure diagnosis of Wound #12 is a Pressure Ulcer located on the Left Calcaneus . There was a Excisional Skin/Subcutaneous Tissue Debridement with a total area of 2.7 sq cm performed by Tommie Sams., PA-C. With the following instrument(s): Curette to remove Viable and Non-Viable tissue/material. Material removed includes Callus, Subcutaneous Tissue, Slough, and Biofilm after achieving pain control using Lidocaine. A time out was conducted at 16:27, prior to the start of the procedure. A Minimum amount of bleeding was controlled with Pressure. The procedure was tolerated well. Post Debridement Measurements: 1.4cm length x 1.7cm width x 0.2cm depth; 0.374cm^3 volume. Post debridement Stage noted as Category/Stage III. Character of Wound/Ulcer Post Debridement is improved. Post procedure Diagnosis Wound #12: Same as Pre-Procedure Pre-procedure diagnosis of Wound #12 is a Pressure Ulcer located on the Left Calcaneus . There was a Three Layer Compression Therapy Procedure by Donnamarie Poag, RN. Post procedure Diagnosis Wound #12: Same as Pre-Procedure Plan Follow-up Appointments: Return Appointment in 1 week. Home Health: [facility] for wound care. May utilize formulary equivalent dressing for wound treatment orders unless otherwise specified. Home Health Nurse may  visit PRN to address patient s wound care needs. Scheduled days for dressing changes to be completed; exception, patient has scheduled wound care visit that day. **Please direct any NON-WOUND related issues/requests for orders to patient's Primary Care Physician. **If current dressing causes regression in wound condition, may D/C ordered dressing product/s and apply Normal Saline Moist Dressing daily until next [facility] or Other MD appointment. **Notify [facility] of regression in wound condition at [phone]. Edema Control -  Lymphedema / Segmental Compressive Device / Other: Elevate, Exercise Daily and Avoid Standing for Long Periods of Time. Elevate legs to the level of the heart and pump ankles as often as possible - ELEVATE LEGS Elevate leg(s) parallel to the floor when sitting. Off-Loading: Open toe surgical shoe Turn and reposition every 2 hours Other: - Keep pressure off left heel WOUND #12: - Calcaneus Wound Laterality: Left Cleanser: Soap and Water 2 x Per Week/30 Days Discharge Instructions: Gently cleanse wound with antibacterial soap, rinse and pat dry prior to dressing wounds Peri-Wound Care: Desitin Maximum Strength Ointment 4 (oz) 2 x Per Week/30 Days Discharge Instructions: Apply periwound for protection Primary Dressing: Hydrofera Blue Ready Transfer Foam, 4x5 (in/in) (Generic) 2 x Per Week/30 Days Discharge Instructions: Apply to wound bed-cut hydrofera about 2 cm bigger around for absorption. Ensure hydrofera blue touches wound bed Secondary Dressing: ABD Pad 5x9 (in/in) 2 x Per Week/30 Days Discharge Instructions: Apply to secure hydrofera blue AND on shin area for protection Compression Wrap: Profore Lite LF 3 Multilayer Compression Bandaging System 2 x Per Week/30 Days Discharge Instructions: Apply 3 multi-layer wrap as prescribed. WOUND #13: - Lower Leg Wound Laterality: Left, Midline, Anterior Cleanser: Soap and Water 2 x Per Week/30 Days Discharge Instructions: Gently cleanse wound with antibacterial soap, rinse and pat dry prior to dressing wounds Peri-Wound Care: Desitin Maximum Strength Ointment 4 (oz) 2 x Per Week/30 Days Discharge Instructions: Apply periwound for protection Primary Dressing: Hydrofera Blue Ready Transfer Foam, 4x5 (in/in) (Generic) 2 x Per Week/30 Days Discharge Instructions: Apply to wound bed-cut hydrofera about 2 cm bigger around for absorption. Ensure hydrofera blue touches wound bed Secondary Dressing: ABD Pad 5x9 (in/in) 2 x Per Week/30 Days Discharge  Instructions: Apply to secure hydrofera blue AND on shin area for protection Compression Wrap: Profore Lite LF 3 Multilayer Compression Bandaging System 2 x Per Week/30 Days Discharge Instructions: Apply 3 multi-layer wrap as prescribed. [place], Lyanna H. ([id number]) 1. Would recommend that we going to continue with wound care measures as before and the patient is in agreement the plan. This includes the use of the [facility] dressing which I think is doing a great job currently. 2. I am also can recommend that we have the patient continue with a 3 layer compression wrap which I think is also doing a great job. We will see patient back for reevaluation in 1 week here in the clinic. If anything worsens or changes patient will contact our office for additional recommendations. Electronic Signature(s) Signed: 12/15/2020 5:18:09 PM By: Worthy Keeler PA-C Entered By: Worthy Keeler on 12/15/2020 17:18:09 [name] ([id number]) -------------------------------------------------------------------------------- SuperBill Details Patient Name: [name] Date of Service: 12/15/2020 Medical Record Number: [id number] Patient Account Number: [id number] Date of Birth/Sex: 10-May-1950 (70 y.o. F) Treating RN: Donnamarie Poag Primary Care Provider: [name] Other Clinician: Referring Provider: [name] Treating Provider/Extender: [name] in Treatment: 21 Diagnosis Coding ICD-10 Codes Code Description [id number] Pressure ulcer of left heel, stage 3  [id number] Non-pressure chronic ulcer of other part of left lower leg with fat layer exposed I10 Essential (primary) hypertension N18.30 Chronic kidney disease, stage 3 unspecified J44.9 Chronic obstructive pulmonary disease, unspecified G35 Multiple sclerosis Facility Procedures CPT4 Code: [id number] Description: [id number] - DEB SUBQ TISSUE 20 SQ CM/< Modifier: Quantity: 1 CPT4 Code: Description: ICD-10 Diagnosis Description  L89.623 Pressure ulcer of left heel, stage 3 Modifier: Quantity: Physician Procedures CPT4 Code: [id number] Description: [id number] - WC PHYS SUBQ TISS 20 SQ CM Modifier: Quantity: 1 CPT4 Code: Description: ICD-10 Diagnosis Description L89.623 Pressure ulcer of left heel, stage 3 Modifier: Quantity: Electronic Signature(s) Signed: 12/15/2020 5:18:21 PM By: Worthy Keeler PA-C Previous Signature: 12/15/2020 4:31:30 PM Version By: Donnamarie Poag Entered By: Worthy Keeler on 12/15/2020 17:18:20

## 2020-12-25 ENCOUNTER — Other Ambulatory Visit: Payer: Self-pay

## 2020-12-25 ENCOUNTER — Encounter: Payer: Medicare HMO | Admitting: Physician Assistant

## 2020-12-25 DIAGNOSIS — L89623 Pressure ulcer of left heel, stage 3: Secondary | ICD-10-CM | POA: Diagnosis not present

## 2020-12-25 NOTE — Progress Notes (Addendum)
JOELYS, GLASGOW ([id number]) Visit Report for 12/25/2020 Chief Complaint Document Details Patient Name: [name], [name] Date of Service: 12/25/2020 3:45 PM Medical Record Number: [id number] Patient Account Number: [id number] Date of Birth/Sex: 06/12/1950 (70 y.o. F) Treating RN: Carlene Coria Primary Care Provider: [name] Other Clinician: Referring Provider: [name] Treating Provider/Extender: [name] in Treatment: 22 Information Obtained from: Patient Chief Complaint Left Heel Pressure Ulcer Electronic Signature(s) Signed: 12/25/2020 3:58:04 PM By: Worthy Keeler PA-C Entered By: Worthy Keeler on 12/25/2020 15:58:04 [name] -------------------------------------------------------------------------------- HPI Details Patient Name: [name] Date of Service: 12/25/2020 3:45 PM Medical Record Number: [id number] Patient Account Number: [id number] Date of Birth/Sex: 1950/09/19 (70 y.o. F) Treating RN: Carlene Coria Primary Care Provider: [name] Other Clinician: Referring Provider: [name] Treating Provider/Extender: [name] in Treatment: 64 History of Present Illness HPI Description: 70 year old with a past medical history significant for MS, urinary incontinence, and obesity. She has been seen in the wound clinic before for lower extremity ulcerations treated with compression therapy. she is also known to have hypertension, peripheral vascular disease, COPD, obstructive sleep apnea, lumbar radiculopathy, kyphoscoliosis, urinary issues and tobacco abuse. Smokes a packet of cigarettes a day was recently seen at the [facility] for swelling of her legs and feet with a ulceration on the dorsum of the right foot which has been there for about 6 months. she was recently in the ER about a month ago where she was seen for shortness of breath and swelling of the legs and a chest x-ray was within  normal limits had an increase in her BNP and was given Lasix and put on doxycycline for a mild cellulitis and possible UTI. Wounds aresmaller today 11/13/15. Debrided and will continue Santyl. 12/21/2015 -- she was admitted to the hospital overnight on 12/18/2015 and diagnosed with urinary retention and cellulitis of the left lower leg. is past to take clindamycin and use Santyl for her wounds. 01/15/2016 -- come back to see [place] for almost a month and continues to be noncompliant with her dressings 01/30/16 patient presents today for a follow-up visit concerning her ongoing bilateral lower extremity wounds. We have not seen her for the past 2 weeks although we are supposed to be seen her for weekly visits. She currently has a Foley catheter. She tells me that the wounds are intensely painful especially with pressure and palpation at this point in time. Fortunately she is having no interval signs or symptoms of systemic infection but unfortunately the wounds have not improved dramatically since we last saw her. She does have home health coming to take care of her as well. She is currently not in any compression wraps. 02/06/16 ON evaluation today patient continues to experience discomfort regarding her bilateral lower extremity ulcers. She has continued to tolerate the dressing changes at this point in time and continues to have a Foley catheter as well. Fortunately she is back this week in the past she has been somewhat noncompliant with follow-ups I'm glad to see her today. She tells me that her pain level varies but can be as high as a 7 out of 10 with manipulation of the wound. She tells me that she used to be on oxycodone which was managed by the pain clinic although she is no longer on that as she tells me that she was actually smoking marijuana at the time and when this was found out they discontinued her pain medication. She no longer  is taking anything pain medication wise and she also does not  smoke cigarettes nor marijuana at this point. 02/12/16; this is a patient I don't believe I have previously seen. She has multiple sclerosis. She has 4 punched-out areas on the anterior lateral left leg 2 areas on the right these are all in the same condition. Reasonably small [dime size] wounds each was some depth. Surface of these wounds does not look particularly healthy as there is adherent slough. There is no evidence of surrounding infection or inflammation. ABIs in this clinic were 0.87 on the right and 0.81 on the left. She is listed as having PAD and is a smoker. Not a diabetic 02/20/16 patient I gave antibiotics to last week for erythema around both wound sites on the left lateral leg and right lateral leg. This appears to be a lot better. One of the areas on the left leg is healed however she still has 3 punched-out open areas on the left lateral calf and one on the right lateral calf and one on the dorsal foot. She is an ex-smoker quitting 1 month ago. ABIs in this clinic were 0.87 on the right 0.81 on the left 03/12/16; this is a patient I really don't have a good sense of. It would appear for the first 5 or 6 weeks of her stay in this clinic she was cared for by Dr. Con Memos. She appeared on my schedule in mid October and I saw her twice. She has not been here however in 3 weeks. She has advanced Wound Care at home where she lives with her husband. She has multiple sclerosis. I saw her the first time she had 4 punched-out small wounds on the anterior lateral left leg it appears that she is now down to 2. She also had wounds on the lateral and medial aspect of her right leg which were also small and punched out. The only one that remains is on the right lateral. Because of the nature of her wound I went ahead and ordered formal arterial studies this showed an ABI of 1 on the right and one on the left. TBI of 1.4 on the right and 0.79 on the left. She had normal waveforms. She was in the  emergency room on 02/28/16; there is a noted edema of her left leg after a fall. She had a duplex ultrasound that showed no evidence for an acute DVT from the left groin to the popliteal fossa. The study was limited in the calf veins due to edema. Also noticeable that she had a left inguinal enlarged lymph node up to 6.2 cm. She also had a left knee x-ray that showed no acute findings and a right foot x-ray that showed soft tissue swelling but no radiographic evidence of osteomyelitis. The patient does stop smoking 03-19-16 Ms Beaupre presents today for evaluation of her bilateral lower extremity ulcerations. She states that she has not smoked in several weeks. She denies any pain or discomfort to bilateral lower extremities, tolerating compression therapy as ordered. 03/26/16; I have not seen this patient in 2 weeks however she has done very well with improvements in the areas in question. After we obtain normal arterial studies, increasing the 4-layer compression really seems to look done a nice job here. She has one open area on the left lateral leg and one on the medial right leg. These have filled in nicely and are now superficial wounds that showed epithelialized. I note the left inguinal lymph node at  6.2 cm. I may need to refer this back to the patient's primary doctor. 04/03/16; patient has 2 remaining wounds 1 on the left lateral leg and one on the right medial leg. Both of filled in nicely since we are able to increase her from a 3 layer to a 4 layer compression. I am also following up on the lymph node notable on her left leg DVT rule out in November 04/10/16; area on the right lateral leg is healed. The area on the left leg is still open but looks considerably better with healthy granulation and less wound area. I12/20/17; last week we healed the patient doubt with regards to the wound on the right leg to her on compression stocking 20-30 mmHg as it turns out I don't think she had a stocking,  predictably therefore she has reopened. The left leg as well as the wound on the lateral aspect. Been generally small line 04/24/16; we healed out her right leg 2 weeks ago to her own 20-30 mm compression stockings although as it turns out she didn't have these and did not purchase some apparently because of financial issues. The left leg wound is on the lateral aspect. Both of these wounds are just about healed. 05/01/16; her wounds on the left leg are healed out today. The area on the right leg is also healed. Vitamin diligent effort of our staff we have not been able to get any form of compression stocking to this patient. The orders for juxtalite's were given to home health this never materialized. We ordered compression stockings I don't think she was able to afford these. We had a discussion today with both the patient and her husband without these, will accumulate edema and the wounds will simply reopen again. 05/14/16 READMISSION Beverley, [place] ([id number]) this is a patient we healed out 2 weeks ago. As bilateral lower extremity venous wounds probably some degree of lymphedema. At the beginning in November I did a duplex ultrasound of her left leg that was negative for DVT but did show a lymph node in the left inguinal area presumably reactive. We've been using compression to her lower extremities eventually healed all her wounds on her bilateral calves but we were not able to get any form of compression stockings for her in spite of intense efforts of our staff. She returns today with reopening on the left leg did not the right she has several superficial areas anteriorly but an actual frank ulcer on the lateral left calf. This is about the size of a dime or smaller. She does not really complain of pain. The patient has a history of PAD however arterial studies we did in November were normal. Her ABIs and Doppler waveforms were both within the normal limits. The patient has a history of MS.  In discussing things with her today it would appear that the opening was noted 2 weeks ago according to the patient. We gave her Tubigrip stockings when she left the clinic 05/21/16; she has not yet had the duplex ultrasound of her thigh, this is booked for Thursday. The wound on the left lateral lower leg is improved 05/28/16; her duplex ultrasound of the left leg specifically her left thigh did not show a DVT however did show hypoechogenic enlarged left inguinal lymph nodes up to 6.9 x 3.7 x 6.8. This also showed in the one in November however maximal diameter was 6.2, at that point I thought these were reactive secondary to cellulitis however there is obviously  a differential here. Her wound on the left lower leg is closed. She has a superficial open area on the base of her left fifth metatarsal head on the plantar foot. It looks as though she has some wrap injuries on the anterior leg 06/05/16; since she was last year she had a fall on 1/31. She was seen in the ER but sent back home. I think she is also been seen by her primary physician who picked up on the swelling and the lymphadenopathy. She is ordered a CT scan of the abdomen and pelvis however this will be without contrast because of stage IIIB renal insufficiency. Nevertheless this should be a start the workup to make sure there is not is more systemic problem here. She will probably need consideration of a biopsy of the lymph node in her inguinal area, this would need general surgery. As far as her wounds are concern today the area on the left plantar foot is healed. She has a small weeping area on the left lower leg. The wrap injury from last week has largely Pardoxically there is a lot less edema in the left thigh. 06/12/16; CT scan is on Friday. She has 2 small open areas one on the lateral left lower leg and one on the anterior lower leg on the left. Both of these looks healthy. I reduced her compression last week to Kerlix and Coban this  seems to have not a reasonable job 07/02/16; the patient is now in the skilled facility. I think this was arranged out of the ER when she fell at home although I'm not exactly sure. She is going for I think a laparotomy for the pelvic mass next week. All the wounds in the left leg have healed Readmission: 07/20/2020 upon evaluation today patient presents for reevaluation here in clinic unfortunately she is having issues with a significant stage III pressure ulcer to her left heel. This is something that occurred when she was in the hospital sick. And subsequently though she does not appear to have any new injury she is still trying to recover from the pressure injury which was quite severe during that time. I do not see any signs of infection right now which is great news. With that being said she has been require some debridement to try to clear this up as much as possible. We need to get the wound VAC cleared so that she can see overall healing and improvement that were looking for. The patient does have a history of hypertension, chronic kidney disease stage III, COPD, and multiple sclerosis. Currently she has been trying to do what she can to keep pressure off of her heel while in bed and in other locations as well. 08/04/20 upon evaluation today patient appears to be doing extremely well currently in regard to her wound on the heel. I do believe the Iodoflex has helped loosen things appear. Unfortunately she tells me that the home health nurse has not been putting the Iodoflex on the wound. That is news to [place] and that they tell me they got supplies in the mail but they did not see the Iodoflex being used. Nonetheless I am wondering if this is just something that they missed or if it truly has not been. I did tell them to look when they get home in their box of supplies to see if they saw the dressing which showed and what it should look like. 08/11/2020 upon evaluation today patient appears to be  doing decently  well in regard to her wound although unfortunately this is showing signs of some blue-green drainage consistent with Pseudomonas it does have a little worried in this regard. Fortunately there is no evidence of active infection which is great news. No fevers, chills, nausea, vomiting, or diarrhea. 08/18/2020 upon evaluation today patient appears to be doing well with regard to her wound. She has been tolerating the dressing changes without complication. Fortunately there is no signs of active infection at this time. No fevers, chills, nausea, vomiting, or diarrhea. 4/29; left heel wound pressure ulcer. Slightly smaller. 09/04/2020 upon evaluation today patient appears to be doing well with regard to her wound. This is actually on her heel and seems to be doing well other than the fact that is a little bit moist from excessive drainage. There does not appear to be any signs of active infection at this time. No fevers, chills, nausea, vomiting, or diarrhea.. 09/11/2020 upon evaluation today patient appears to be doing okay in regard to her wound. With that being said she still has quite a bit of drainage noted at this point. Fortunately there is no signs of active infection which is great news overall I am extremely pleased with where things stand today. I do think we need to try to do something a little bit more to help control the drainage a bit better. 5/23; left posterior heel. This actually looks quite good. Using Hydrofera Blue under compression. Dimensions are smaller 09/26/2020 upon evaluation today patient appears to be doing well with regard to her heel ulcer. In fact this is showing signs of excellent improvement and overall I am extremely pleased with where things stand today. I do not see any evidence of infection which is great news and overall I feel like she is headed in the correct direction. 10/09/2020 upon evaluation today patient appears to be doing excellent in regard to  her wound. She has been tolerating the dressing changes without complication. Fortunately there is no evidence of active infection which is also excellent news. No fevers, chills, nausea, vomiting, or diarrhea. 10/16/2020 upon evaluation today patient appears to be doing well with regard to her heel ulcer. Unfortunately she is having issues with an area of deep tissue injury/pressure injury over the shin due to the wrap being on way too tight today. Fortunately there does not appear to be any signs of infection currently. No fevers, chills, nausea, vomiting, or diarrhea. 10/23/2020 upon evaluation today patient appears to be doing better in regard to her wound. Fortunately there does not appear to be any signs of active infection at this time which is great news. In general I am extremely pleased with where things stand at this point. The patient does not show any signs of worsening which is excellent. She unfortunately still has the area in question with regard to the region over the anterior portion of her shin where she did have some deep tissue injury. Some of this is starting to open up a little bit. With that being said I think the biggest thing we need to do at this point is to try to as much as possible monitor for any additional breakdown and protect the region while it heals up. I think it is actually doing quite well which is great news. Fortunately there does not appear to be any evidence of active infection 11/14/2018 upon evaluation today patient appears to be doing okay in regard to her ischial. Her leg is much more swollen though she has not  had a wrap on any type of dressing for some time when home health came out they did not have the Tubigrip. We have not actually seen her since June 27. The patient when I asked her about it seemed to get somewhat upset to be honest. She seems to be having some trouble at home. She states that she had to "raise hell" just to be able to get here to this  appointment today. With that being said I really do think she needs to have Ericsson, Shavana H. ([id number]) this wraps and keep the swelling under control and was doing a whole lot better but now seems to be not doing quite as well to be honest. The anterior portion of her shin does seem to be healing and doing better although it did end up opening into the wound. Again that was where home health the wrap does have a little bit too tight causing this issue previously. 11/27/2020 upon evaluation today patient appears to be doing poorly in regard to her anterior shin where she did have some area where the wrap actually aggravated the spot. With that being said I think that we do need to make sure to pad this extremely well there is already some signs of healing at this point. I did believe this was probably can open even when I saw it last week and to be honest it has opened. The heel also is showing signs of improvement I do see some slough noted but nothing too significant which is great news. No fevers, chills, nausea, vomiting, or diarrhea. 12/07/2020 upon evaluation today patient appears to be doing decently well in regard to her wounds. Both are showing signs of some slough and biofilm buildup on the surface of the wounds. Fortunately there is no signs of active infection at this time which is great news. No fevers, chills, nausea, vomiting, or diarrhea. 12/15/2020 upon evaluation today patient appears to be doing well with regard to her wounds. She is showing signs of improvement at both locations which is great news. Fortunately I do not see any evidence of active infection at this time which is great as well. 12/25/2020 upon evaluation today patient appears to be doing well with regard to her wound on the posterior heel. This seems to be making some progress which is great news. Unfortunately the biggest issue is that of the shin which again just does not seem to be healing as effectively and quickly  as I would like to see. Fortunately there does not appear to be any evidence of active infection which is great news I do believe that we can try to do something a little different using Tubigrip instead of the compression wrapping to see if this will keep pressure off of the shin allow this to heal more effectively and quickly. Electronic Signature(s) Signed: 12/25/2020 4:53:39 PM By: Worthy Keeler PA-C Entered By: Worthy Keeler on 12/25/2020 16:53:39 [name] ([id number]) -------------------------------------------------------------------------------- Physical Exam Details Patient Name: [name] Date of Service: 12/25/2020 3:45 PM Medical Record Number: [id number] Patient Account Number: [id number] Date of Birth/Sex: Jul 27, 1950 (70 y.o. F) Treating RN: Carlene Coria Primary Care Provider: [name] Other Clinician: Referring Provider: [name] Treating Provider/Extender: [name] in Treatment: 28 Constitutional Well-nourished and well-hydrated in no acute distress. Respiratory normal breathing without difficulty. Psychiatric this patient is able to make decisions and demonstrates good insight into disease process. Alert and Oriented x 3. pleasant and cooperative. Notes Upon inspection patient's  wound bed showed signs of good granulation epithelization at this point. Fortunately there does not appear to be any signs of infection which is great and overall I am extremely pleased with where things stand today. Electronic Signature(s) Signed: 12/25/2020 4:53:54 PM By: Worthy Keeler PA-C Entered By: Worthy Keeler on 12/25/2020 16:53:54 [name] ([id number]) -------------------------------------------------------------------------------- Physician Orders Details Patient Name: [name] Date of Service: 12/25/2020 3:45 PM Medical Record Number: [id number] Patient Account Number: [id number] Date of Birth/Sex: [birth year] (69 y.o.  F) Treating RN: Carlene Coria Primary Care Provider: [name] Other Clinician: Referring Provider: [name] Treating Provider/Extender: [name] in Treatment: 22 Verbal / Phone Orders: No Diagnosis Coding ICD-10 Coding Code Description [phone] Pressure ulcer of left heel, stage 3 L97.822 Non-pressure chronic ulcer of other part of left lower leg with fat layer exposed I10 Essential (primary) hypertension N18.30 Chronic kidney disease, stage 3 unspecified J44.9 Chronic obstructive pulmonary disease, unspecified G35 Multiple sclerosis Follow-up Appointments o Return Appointment in 1 week. [place]: - [facility] for wound care. May utilize formulary equivalent dressing for wound treatment orders unless otherwise specified. Home Health Nurse may visit PRN to address patientos wound care needs. o Scheduled days for dressing changes to be completed; exception, patient has scheduled wound care visit that day. o **Please direct any NON-WOUND related issues/requests for orders to patient's Primary Care Physician. **If current dressing causes regression in wound condition, may D/C ordered dressing product/s and apply Normal Saline Moist Dressing daily until next [place] or Other MD appointment. **Notify [facility]aling [facility] of regression in wound condition at [phone]. Edema Control - Lymphedema / Segmental Compressive Device / Other Bilateral Lower Extremities o Tubigrip single layer applied. - size D o Elevate, Exercise Daily and Avoid Standing for Long Periods of Time. o Elevate legs to the level of the heart and pump ankles as often as possible - ELEVATE LEGS o Elevate leg(s) parallel to the floor when sitting. Off-Loading Left Lower Extremity o Open toe surgical shoe o Turn and reposition every 2 hours o Other: - Keep pressure off left heel Wound Treatment Wound #12 -  Calcaneus Wound Laterality: Left Cleanser: Soap and Water 2 x Per Week/30 Days Discharge Instructions: Gently cleanse wound with antibacterial soap, rinse and pat dry prior to dressing wounds Primary Dressing: Hydrofera Blue Ready Transfer Foam, 4x5 (in/in) (Generic) 2 x Per Week/30 Days Discharge Instructions: Apply to wound bed-cut hydrofera about 2 cm bigger around for absorption. Ensure hydrofera blue touches wound bed Secondary Dressing: Mepilex Border Flex, 4x4 (in/in) 2 x Per Week/30 Days Discharge Instructions: Apply to wound as directed. Do not cut. Wound #13 - Lower Leg Wound Laterality: Left, Midline, Anterior Cleanser: Soap and Water 2 x Per Week/30 Days Discharge Instructions: Gently cleanse wound with antibacterial soap, rinse and pat dry prior to dressing wounds Primary Dressing: Hydrofera Blue Ready Transfer Foam, 4x5 (in/in) (Generic) 2 x Per Week/30 Days Discharge Instructions: Apply to wound bed-cut hydrofera about 2 cm bigger around for absorption. Ensure hydrofera blue touches wound bed Vice, Uzma H. ([id number]) Secondary Dressing: ABD Pad 5x9 (in/in) 2 x Per Week/30 Days Discharge Instructions: Apply to secure hydrofera blue AND on shin area for protection Compression Wrap: Profore Lite LF 3 Multilayer Compression Bandaging System 2 x Per Week/30 Days Discharge Instructions: Apply 3 multi-layer wrap as prescribed. Electronic Signature(s) Signed: 12/25/2020 5:05:16 PM By: Worthy Keeler PA-C Signed: 12/27/2020 7:55:31 AM  By: Carlene Coria RN Entered By: Carlene Coria on 12/25/2020 16:12:03 [name] ([id number]) -------------------------------------------------------------------------------- Problem List Details Patient Name: [name] Date of Service: 12/25/2020 3:45 PM Medical Record Number: [id number] Patient Account Number: [id number] Date of Birth/Sex: 05-08-1950 (70 y.o. F) Treating RN: Carlene Coria Primary Care Provider: [name] Other  Clinician: Referring Provider: [name] Treating Provider/Extender: [name] in Treatment: 22 Active Problems ICD-10 Encounter Code Description Active Date MDM Diagnosis [phone] Pressure ulcer of left heel, stage 3 07/20/2020 No Yes L97.822 Non-pressure chronic ulcer of other part of left lower leg with fat layer 11/27/2020 No Yes exposed [place] (primary) hypertension 07/20/2020 No Yes N18.30 Chronic kidney disease, stage 3 unspecified 07/20/2020 No Yes J44.9 Chronic obstructive pulmonary disease, unspecified 07/20/2020 No Yes G35 Multiple sclerosis 07/20/2020 No Yes Inactive Problems Resolved Problems Electronic Signature(s) Signed: 12/25/2020 3:57:57 PM By: Worthy Keeler PA-C Entered By: Worthy Keeler on 12/25/2020 15:57:57 Ennen, [name] ([id number]) -------------------------------------------------------------------------------- Progress Note Details Patient Name: [name] Date of Service: 12/25/2020 3:45 PM Medical Record Number: [id number] Patient Account Number: [id number] Date of Birth/Sex: 1950/07/10 (70 y.o. F) Treating RN: Carlene Coria Primary Care Provider: [name] Other Clinician: Referring Provider: [name] Treating Provider/Extender: [name] in Treatment: 22 Subjective Chief Complaint Information obtained from Patient Left Heel Pressure Ulcer History of Present Illness (HPI) 70 year old with a past medical history significant for MS, urinary incontinence, and obesity. She has been seen in the wound clinic before for lower extremity ulcerations treated with compression therapy. she is also known to have hypertension, peripheral vascular disease, COPD, obstructive sleep apnea, lumbar radiculopathy, kyphoscoliosis, urinary issues and tobacco abuse. Smokes a packet of cigarettes a day was recently seen at the [facility] for swelling of her legs and feet with a ulceration on the dorsum of the  right foot which has been there for about 6 months. she was recently in the ER about a month ago where she was seen for shortness of breath and swelling of the legs and a chest x-ray was within normal limits had an increase in her BNP and was given Lasix and put on doxycycline for a mild cellulitis and possible UTI. Wounds aresmaller today 11/13/15. Debrided and will continue Santyl. 12/21/2015 -- she was admitted to the hospital overnight on 12/18/2015 and diagnosed with urinary retention and cellulitis of the left lower leg. is past to take clindamycin and use Santyl for her wounds. 01/15/2016 -- come back to see [place] for almost a month and continues to be noncompliant with her dressings 01/30/16 patient presents today for a follow-up visit concerning her ongoing bilateral lower extremity wounds. We have not seen her for the past 2 weeks although we are supposed to be seen her for weekly visits. She currently has a Foley catheter. She tells me that the wounds are intensely painful especially with pressure and palpation at this point in time. Fortunately she is having no interval signs or symptoms of systemic infection but unfortunately the wounds have not improved dramatically since we last saw her. She does have home health coming to take care of her as well. She is currently not in any compression wraps. 02/06/16 ON evaluation today patient continues to experience discomfort regarding her bilateral lower extremity ulcers. She has continued to tolerate the dressing changes at this point in time and continues to have a Foley catheter as well. Fortunately she is back this week in the past she has been somewhat  noncompliant with follow-ups I'm glad to see her today. She tells me that her pain level varies but can be as high as a 7 out of 10 with manipulation of the wound. She tells me that she used to be on oxycodone which was managed by the pain clinic although she is no longer on that as she tells me  that she was actually smoking marijuana at the time and when this was found out they discontinued her pain medication. She no longer is taking anything pain medication wise and she also does not smoke cigarettes nor marijuana at this point. 02/12/16; this is a patient I don't believe I have previously seen. She has multiple sclerosis. She has 4 punched-out areas on the anterior lateral left leg 2 areas on the right these are all in the same condition. Reasonably small [dime size] wounds each was some depth. Surface of these wounds does not look particularly healthy as there is adherent slough. There is no evidence of surrounding infection or inflammation. ABIs in this clinic were 0.87 on the right and 0.81 on the left. She is listed as having PAD and is a smoker. Not a diabetic 02/20/16 patient I gave antibiotics to last week for erythema around both wound sites on the left lateral leg and right lateral leg. This appears to be a lot better. One of the areas on the left leg is healed however she still has 3 punched-out open areas on the left lateral calf and one on the right lateral calf and one on the dorsal foot. She is an ex-smoker quitting 1 month ago. ABIs in this clinic were 0.87 on the right 0.81 on the left 03/12/16; this is a patient I really don't have a good sense of. It would appear for the first 5 or 6 weeks of her stay in this clinic she was cared for by Dr. Con Memos. She appeared on my schedule in mid October and I saw her twice. She has not been here however in 3 weeks. She has advanced Wound Care at home where she lives with her husband. She has multiple sclerosis. I saw her the first time she had 4 punched-out small wounds on the anterior lateral left leg it appears that she is now down to 2. She also had wounds on the lateral and medial aspect of her right leg which were also small and punched out. The only one that remains is on the right lateral. Because of the nature of her wound I  went ahead and ordered formal arterial studies this showed an ABI of 1 on the right and one on the left. TBI of 1.4 on the right and 0.79 on the left. She had normal waveforms. She was in the emergency room on 02/28/16; there is a noted edema of her left leg after a fall. She had a duplex ultrasound that showed no evidence for an acute DVT from the left groin to the popliteal fossa. The study was limited in the calf veins due to edema. Also noticeable that she had a left inguinal enlarged lymph node up to 6.2 cm. She also had a left knee x-ray that showed no acute findings and a right foot x-ray that showed soft tissue swelling but no radiographic evidence of osteomyelitis. The patient does stop smoking 03-19-16 Ms Mauri presents today for evaluation of her bilateral lower extremity ulcerations. She states that she has not smoked in several weeks. She denies any pain or discomfort to bilateral lower extremities, tolerating  compression therapy as ordered. 03/26/16; I have not seen this patient in 2 weeks however she has done very well with improvements in the areas in question. After we obtain normal arterial studies, increasing the 4-layer compression really seems to look done a nice job here. She has one open area on the left lateral leg and one on the medial right leg. These have filled in nicely and are now superficial wounds that showed epithelialized. I note the left inguinal lymph node at 6.2 cm. I may need to refer this back to the patient's primary doctor. 04/03/16; patient has 2 remaining wounds 1 on the left lateral leg and one on the right medial leg. Both of filled in nicely since we are able to increase her from a 3 layer to a 4 layer compression. I am also following up on the lymph node notable on her left leg DVT rule out in November 04/10/16; area on the right lateral leg is healed. The area on the left leg is still open but looks considerably better with healthy granulation and less  wound area. I12/20/17; last week we healed the patient doubt with regards to the wound on the right leg to her on compression stocking 20-30 mmHg as it turns out I don't think she had a stocking, predictably therefore she has reopened. The left leg as well as the wound on the lateral aspect. Been generally small line 04/24/16; we healed out her right leg 2 weeks ago to her own 20-30 mm compression stockings although as it turns out she didn't have these and did not purchase some apparently because of financial issues. The left leg wound is on the lateral aspect. Both of these wounds are just about healed. 05/01/16; her wounds on the left leg are healed out today. The area on the right leg is also healed. Vitamin diligent effort of our staff we have not been able to get any form of compression stocking to this patient. The orders for juxtalite's were given to home health this never materialized. We Achor, Nattie H. ([id number]) ordered compression stockings I don't think she was able to afford these. We had a discussion today with both the patient and her husband without these, will accumulate edema and the wounds will simply reopen again. 05/14/16 READMISSION this is a patient we healed out 2 weeks ago. As bilateral lower extremity venous wounds probably some degree of lymphedema. At the beginning in November I did a duplex ultrasound of her left leg that was negative for DVT but did show a lymph node in the left inguinal area presumably reactive. We've been using compression to her lower extremities eventually healed all her wounds on her bilateral calves but we were not able to get any form of compression stockings for her in spite of intense efforts of our staff. She returns today with reopening on the left leg did not the right she has several superficial areas anteriorly but an actual frank ulcer on the lateral left calf. This is about the size of a dime or smaller. She does not really complain of  pain. The patient has a history of PAD however arterial studies we did in November were normal. Her ABIs and Doppler waveforms were both within the normal limits. The patient has a history of MS. In discussing things with her today it would appear that the opening was noted 2 weeks ago according to the patient. We gave her Tubigrip stockings when she left the clinic 05/21/16; she has not  yet had the duplex ultrasound of her thigh, this is booked for Thursday. The wound on the left lateral lower leg is improved 05/28/16; her duplex ultrasound of the left leg specifically her left thigh did not show a DVT however did show hypoechogenic enlarged left inguinal lymph nodes up to 6.9 x 3.7 x 6.8. This also showed in the one in November however maximal diameter was 6.2, at that point I thought these were reactive secondary to cellulitis however there is obviously a differential here. Her wound on the left lower leg is closed. She has a superficial open area on the base of her left fifth metatarsal head on the plantar foot. It looks as though she has some wrap injuries on the anterior leg 06/05/16; since she was last year she had a fall on 1/31. She was seen in the ER but sent back home. I think she is also been seen by her primary physician who picked up on the swelling and the lymphadenopathy. She is ordered a CT scan of the abdomen and pelvis however this will be without contrast because of stage IIIB renal insufficiency. Nevertheless this should be a start the workup to make sure there is not is more systemic problem here. She will probably need consideration of a biopsy of the lymph node in her inguinal area, this would need general surgery. As far as her wounds are concern today the area on the left plantar foot is healed. She has a small weeping area on the left lower leg. The wrap injury from last week has largely Pardoxically there is a lot less edema in the left thigh. 06/12/16; CT scan is on Friday. She  has 2 small open areas one on the lateral left lower leg and one on the anterior lower leg on the left. Both of these looks healthy. I reduced her compression last week to Kerlix and Coban this seems to have not a reasonable job 07/02/16; the patient is now in the skilled facility. I think this was arranged out of the ER when she fell at home although I'm not exactly sure. She is going for I think a laparotomy for the pelvic mass next week. All the wounds in the left leg have healed Readmission: 07/20/2020 upon evaluation today patient presents for reevaluation here in clinic unfortunately she is having issues with a significant stage III pressure ulcer to her left heel. This is something that occurred when she was in the hospital sick. And subsequently though she does not appear to have any new injury she is still trying to recover from the pressure injury which was quite severe during that time. I do not see any signs of infection right now which is great news. With that being said she has been require some debridement to try to clear this up as much as possible. We need to get the wound VAC cleared so that she can see overall healing and improvement that were looking for. The patient does have a history of hypertension, chronic kidney disease stage III, COPD, and multiple sclerosis. Currently she has been trying to do what she can to keep pressure off of her heel while in bed and in other locations as well. 08/04/20 upon evaluation today patient appears to be doing extremely well currently in regard to her wound on the heel. I do believe the Iodoflex has helped loosen things appear. Unfortunately she tells me that the home health nurse has not been putting the Iodoflex on the wound. That  is news to [place] and that they tell me they got supplies in the mail but they did not see the Iodoflex being used. Nonetheless I am wondering if this is just something that they missed or if it truly has not been. I did  tell them to look when they get home in their box of supplies to see if they saw the dressing which showed and what it should look like. 08/11/2020 upon evaluation today patient appears to be doing decently well in regard to her wound although unfortunately this is showing signs of some blue-green drainage consistent with Pseudomonas it does have a little worried in this regard. Fortunately there is no evidence of active infection which is great news. No fevers, chills, nausea, vomiting, or diarrhea. 08/18/2020 upon evaluation today patient appears to be doing well with regard to her wound. She has been tolerating the dressing changes without complication. Fortunately there is no signs of active infection at this time. No fevers, chills, nausea, vomiting, or diarrhea. 4/29; left heel wound pressure ulcer. Slightly smaller. 09/04/2020 upon evaluation today patient appears to be doing well with regard to her wound. This is actually on her heel and seems to be doing well other than the fact that is a little bit moist from excessive drainage. There does not appear to be any signs of active infection at this time. No fevers, chills, nausea, vomiting, or diarrhea.. 09/11/2020 upon evaluation today patient appears to be doing okay in regard to her wound. With that being said she still has quite a bit of drainage noted at this point. Fortunately there is no signs of active infection which is great news overall I am extremely pleased with where things stand today. I do think we need to try to do something a little bit more to help control the drainage a bit better. 5/23; left posterior heel. This actually looks quite good. Using Hydrofera Blue under compression. Dimensions are smaller 09/26/2020 upon evaluation today patient appears to be doing well with regard to her heel ulcer. In fact this is showing signs of excellent improvement and overall I am extremely pleased with where things stand today. I do not see any  evidence of infection which is great news and overall I feel like she is headed in the correct direction. 10/09/2020 upon evaluation today patient appears to be doing excellent in regard to her wound. She has been tolerating the dressing changes without complication. Fortunately there is no evidence of active infection which is also excellent news. No fevers, chills, nausea, vomiting, or diarrhea. 10/16/2020 upon evaluation today patient appears to be doing well with regard to her heel ulcer. Unfortunately she is having issues with an area of deep tissue injury/pressure injury over the shin due to the wrap being on way too tight today. Fortunately there does not appear to be any signs of infection currently. No fevers, chills, nausea, vomiting, or diarrhea. 10/23/2020 upon evaluation today patient appears to be doing better in regard to her wound. Fortunately there does not appear to be any signs of active infection at this time which is great news. In general I am extremely pleased with where things stand at this point. The patient does not show any signs of worsening which is excellent. She unfortunately still has the area in question with regard to the region over the anterior portion of her shin where she did have some deep tissue injury. Some of this is starting to open up a little bit. With that  being said I think the biggest thing we need to do at this point is to try to as much as possible monitor for any additional breakdown and protect the region while it heals up. I think it is actually doing quite well which is great news. Fortunately there does not appear to be any evidence of active infection TERUKO, BOCKOVEN. ([id number]) 11/14/2018 upon evaluation today patient appears to be doing okay in regard to her ischial. Her leg is much more swollen though she has not had a wrap on any type of dressing for some time when home health came out they did not have the Tubigrip. We have not actually seen  her since June 27. The patient when I asked her about it seemed to get somewhat upset to be honest. She seems to be having some trouble at home. She states that she had to "raise hell" just to be able to get here to this appointment today. With that being said I really do think she needs to have this wraps and keep the swelling under control and was doing a whole lot better but now seems to be not doing quite as well to be honest. The anterior portion of her shin does seem to be healing and doing better although it did end up opening into the wound. Again that was where home health the wrap does have a little bit too tight causing this issue previously. 11/27/2020 upon evaluation today patient appears to be doing poorly in regard to her anterior shin where she did have some area where the wrap actually aggravated the spot. With that being said I think that we do need to make sure to pad this extremely well there is already some signs of healing at this point. I did believe this was probably can open even when I saw it last week and to be honest it has opened. The heel also is showing signs of improvement I do see some slough noted but nothing too significant which is great news. No fevers, chills, nausea, vomiting, or diarrhea. 12/07/2020 upon evaluation today patient appears to be doing decently well in regard to her wounds. Both are showing signs of some slough and biofilm buildup on the surface of the wounds. Fortunately there is no signs of active infection at this time which is great news. No fevers, chills, nausea, vomiting, or diarrhea. 12/15/2020 upon evaluation today patient appears to be doing well with regard to her wounds. She is showing signs of improvement at both locations which is great news. Fortunately I do not see any evidence of active infection at this time which is great as well. 12/25/2020 upon evaluation today patient appears to be doing well with regard to her wound on the  posterior heel. This seems to be making some progress which is great news. Unfortunately the biggest issue is that of the shin which again just does not seem to be healing as effectively and quickly as I would like to see. Fortunately there does not appear to be any evidence of active infection which is great news I do believe that we can try to do something a little different using Tubigrip instead of the compression wrapping to see if this will keep pressure off of the shin allow this to heal more effectively and quickly. Objective Constitutional Well-nourished and well-hydrated in no acute distress. Vitals Time Taken: 3:49 PM, Height: 64 in, Weight: 265 lbs, BMI: 45.5, Temperature: 98.6 F, Pulse: 90 bpm, Respiratory Rate: 18 breaths/min,  Blood Pressure: 134/77 mmHg. Respiratory normal breathing without difficulty. Psychiatric this patient is able to make decisions and demonstrates good insight into disease process. Alert and Oriented x 3. pleasant and cooperative. General Notes: Upon inspection patient's wound bed showed signs of good granulation epithelization at this point. Fortunately there does not appear to be any signs of infection which is great and overall I am extremely pleased with where things stand today. Integumentary (Hair, Skin) Wound #12 status is Open. Original cause of wound was Pressure Injury. The date acquired was: 06/27/2020. The wound has been in treatment 22 weeks. The wound is located on the Left Calcaneus. The wound measures 1.1cm length x 1.7cm width x 0.2cm depth; 1.469cm^2 area and 0.294cm^3 volume. There is Fat Layer (Subcutaneous Tissue) exposed. There is no tunneling or undermining noted. There is a medium amount of serous drainage noted. The wound margin is thickened. There is large (67-100%) red, pink granulation within the wound bed. There is a small (1-33%) amount of necrotic tissue within the wound bed including Adherent Slough. Wound #13 status is Open.  Original cause of wound was Shear/Friction. The date acquired was: 11/24/2020. The wound has been in treatment 4 weeks. The wound is located on the Left,Midline,Anterior Lower Leg. The wound measures 3.6cm length x 1cm width x 0.1cm depth; 2.827cm^2 area and 0.283cm^3 volume. There is Fat Layer (Subcutaneous Tissue) exposed. There is a medium amount of serosanguineous drainage noted. The wound margin is indistinct and nonvisible. There is large (67-100%) red granulation within the wound bed. There is a small (1-33%) amount of necrotic tissue within the wound bed including Adherent Slough. Assessment Active Problems ICD-10 Pressure ulcer of left heel, stage 3 Non-pressure chronic ulcer of other part of left lower leg with fat layer exposed Setzler, Anjolie H. ([id number]) Essential (primary) hypertension Chronic kidney disease, stage 3 unspecified Chronic obstructive pulmonary disease, unspecified Multiple sclerosis Plan Follow-up Appointments: Return Appointment in 1 week. Home Health: [facility] for wound care. May utilize formulary equivalent dressing for wound treatment orders unless otherwise specified. Home Health Nurse may visit PRN to address patient s wound care needs. Scheduled days for dressing changes to be completed; exception, patient has scheduled wound care visit that day. **Please direct any NON-WOUND related issues/requests for orders to patient's Primary Care Physician. **If current dressing causes regression in wound condition, may D/C ordered dressing product/s and apply Normal Saline Moist Dressing daily until next [place] or Other MD appointment. **Notify Wound Healing Center of regression in wound condition at [phone]. Edema Control - Lymphedema / Segmental Compressive Device / Other: Tubigrip single layer applied. - size D Elevate, Exercise Daily and Avoid Standing for Long Periods of Time. Elevate legs to the  level of the heart and pump ankles as often as possible - ELEVATE LEGS Elevate leg(s) parallel to the floor when sitting. Off-Loading: Open toe surgical shoe Turn and reposition every 2 hours Other: - Keep pressure off left heel WOUND #12: - Calcaneus Wound Laterality: Left Cleanser: Soap and Water 2 x Per Week/30 Days Discharge Instructions: Gently cleanse wound with antibacterial soap, rinse and pat dry prior to dressing wounds Primary Dressing: Hydrofera Blue Ready Transfer Foam, 4x5 (in/in) (Generic) 2 x Per Week/30 Days Discharge Instructions: Apply to wound bed-cut hydrofera about 2 cm bigger around for absorption. Ensure hydrofera blue touches wound bed Secondary Dressing: Mepilex Border Flex, 4x4 (in/in) 2 x Per Week/30 Days Discharge Instructions: Apply to wound as directed.  Do not cut. WOUND #13: - Lower Leg Wound Laterality: Left, Midline, Anterior Cleanser: Soap and Water 2 x Per Week/30 Days Discharge Instructions: Gently cleanse wound with antibacterial soap, rinse and pat dry prior to dressing wounds Primary Dressing: Hydrofera Blue Ready Transfer Foam, 4x5 (in/in) (Generic) 2 x Per Week/30 Days Discharge Instructions: Apply to wound bed-cut hydrofera about 2 cm bigger around for absorption. Ensure hydrofera blue touches wound bed Secondary Dressing: ABD Pad 5x9 (in/in) 2 x Per Week/30 Days Discharge Instructions: Apply to secure hydrofera blue AND on shin area for protection Compression Wrap: Profore Lite LF 3 Multilayer Compression Bandaging System 2 x Per Week/30 Days Discharge Instructions: Apply 3 multi-layer wrap as prescribed. 1. Would recommend currently that we go ahead and continue with the [facility] which I think is still good to be the best way to go and the patient is in agreement with that plan. 2. Also can recommend that we have the patient continue with the border foam dressings to cover which is good to be something new as have the compression  wrap. 3. I am also can recommend that we have the patient go ahead and use Tubigrip instead of the compression wrap on hoping that this will alleviate any pressure over the shin and allow this to heal more effectively and quickly. We will see patient back for reevaluation in 1 week here in the clinic. If anything worsens or changes patient will contact our office for additional recommendations. Electronic Signature(s) Signed: 12/25/2020 4:54:29 PM By: Worthy Keeler PA-C Entered By: Worthy Keeler on 12/25/2020 16:54:29 [name] -------------------------------------------------------------------------------- SuperBill Details Patient Name: [name] Date of Service: 12/25/2020 Medical Record Number: [id number] Patient Account Number: [id number] Date of Birth/Sex: 08/09/1950 (70 y.o. F) Treating RN: Carlene Coria Primary Care Provider: [name] Other Clinician: Referring Provider: [name] Treating Provider/Extender: [name] in Treatment: 22 Diagnosis Coding ICD-10 Codes Code Description [phone] Pressure ulcer of left heel, stage 3 L97.822 Non-pressure chronic ulcer of other part of left lower leg with fat layer exposed I10 Essential (primary) hypertension N18.30 Chronic kidney disease, stage 3 unspecified J44.9 Chronic obstructive pulmonary disease, unspecified G35 Multiple sclerosis Facility Procedures CPT4 Code: [id number] Description: 99213 - WOUND CARE VISIT-LEV 3 EST PT Modifier: Quantity: 1 Physician Procedures CPT4 Code: [id number] Description: [id number] - WC PHYS LEVEL 4 - EST PT Modifier: Quantity: 1 CPT4 Code: Description: ICD-10 Diagnosis Description L89.623 Pressure ulcer of left heel, stage 3 I10 Essential (primary) hypertension L97.822 Non-pressure chronic ulcer of other part of left lower leg with fat lay N18.30 Chronic kidney disease, stage 3 unspecified Modifier: er exposed Quantity: Electronic Signature(s) Signed:  12/25/2020 4:54:55 PM By: Worthy Keeler PA-C Entered By: Worthy Keeler on 12/25/2020 16:54:55

## 2020-12-27 NOTE — Progress Notes (Signed)
[name] ([id number]) Visit Report for 12/25/2020 Arrival Information Details Patient Name: [name] Date of Service: 12/25/2020 3:45 PM Medical Record Number: [id number] Patient Account Number: [id number] Date of Birth/Sex: 08/22/1950 (69 y.o. F) Treating RN: [name] Primary Care [name]: [name] Other Clinician: Referring [name]: [name] Treating [name]/Extender: [name] in Treatment: 42 Visit Information History Since Last Visit All ordered tests and consults were completed: No Patient Arrived: Wheel Chair Added or deleted any medications: No Arrival Time: 15:43 Any new allergies or adverse reactions: No Accompanied By: husband Had a fall or experienced change in No Transfer Assistance: None activities of daily living that may affect Patient Identification Verified: Yes risk of falls: Secondary Verification Process Completed: Yes Signs or symptoms of abuse/neglect since last visito No Patient Requires Transmission-Based Precautions: No Hospitalized since last visit: No Patient Has Alerts: Yes Implantable device outside of the clinic excluding No cellular tissue based products placed in the center since last visit: Has Dressing in Place as Prescribed: Yes Has Compression in Place as Prescribed: Yes Pain Present Now: No Electronic Signature(s) Signed: 12/27/2020 7:55:31 AM By: [name] Coria RN Entered By: [name] on 12/25/2020 15:49:52 [name] ([id number]) -------------------------------------------------------------------------------- Clinic Level of Care Assessment Details Patient Name: [name] Date of Service: 12/25/2020 3:45 PM Medical Record Number: [id number] Patient Account Number: [id number] Date of Birth/Sex: 01-31-1951 (69 y.o. F) Treating RN: [name] Primary Care Artha Chiasson: [name] Other Clinician: Referring [name]: [name] Treating [name]/Extender: [name] in Treatment: 22 Clinic Level of Care Assessment Items TOOL 4 Quantity Score X - Use when only an EandM is performed on FOLLOW-UP visit 1 0 ASSESSMENTS - Nursing Assessment / Reassessment X - Reassessment of Co-morbidities (includes updates in patient status) 1 10 X- 1 5 Reassessment of Adherence to Treatment Plan ASSESSMENTS - Wound and Skin Assessment / Reassessment _0  - Simple Wound Assessment / Reassessment - one wound 0 X- 1 5 Complex Wound Assessment / Reassessment - multiple wounds _1  - 0 Dermatologic / Skin Assessment (not related to wound area) ASSESSMENTS - Focused Assessment _2  - Circumferential Edema Measurements - multi extremities 0 _3  - 0 Nutritional Assessment / Counseling / Intervention _4  - 0 Lower Extremity Assessment (monofilament, tuning fork, pulses) _5  - 0 Peripheral Arterial Disease Assessment (using hand held doppler) ASSESSMENTS - Ostomy and/or Continence Assessment and Care _6  - Incontinence Assessment and Management 0 _7  - 0 Ostomy Care Assessment and Management (repouching, etc.) PROCESS - Coordination of Care X - Simple Patient / Family Education for ongoing care 1 15 _8  - 0 Complex (extensive) Patient / Family Education for ongoing care _9  - 0 Staff obtains Programmer, systems, Records, Test Results / Process Orders _10  - 0 Staff telephones HHA, Nursing Homes / Clarify orders / etc _11  - 0 Routine Transfer to another Facility (non-emergent condition) _12  - 0 Routine Hospital Admission (non-emergent condition) _13  - 0 New Admissions / Biomedical engineer / Ordering NPWT, Apligraf, etc. _14  - 0 Emergency Hospital Admission (emergent condition) X- 1 10 Simple Discharge Coordination _15  - 0 Complex (extensive) Discharge Coordination PROCESS - Special Needs _16  - Pediatric / Minor Patient Management 0 _17  - 0 Isolation Patient Management _18  - 0 Hearing / Language / Visual special needs _19  - 0 Assessment of Community assistance  (transportation, D/C planning, etc.) _20  - 0 Additional assistance / Altered mentation _21  - 0 Support Surface(s) Assessment (bed, cushion, seat, etc.) INTERVENTIONS - Wound Cleansing / Measurement [name], [name] ([id number]) _22  -  0 Simple Wound Cleansing - one wound X- 2 5 Complex Wound Cleansing - multiple wounds X- 1 5 Wound Imaging (photographs - any number of wounds) _0  - 0 Wound Tracing (instead of photographs) _1  - 0 Simple Wound Measurement - one wound X- 2 5 Complex Wound Measurement - multiple wounds INTERVENTIONS - Wound Dressings _2  - Small Wound Dressing one or multiple wounds 0 X- 2 15 Medium Wound Dressing one or multiple wounds _3  - 0 Large Wound Dressing one or multiple wounds X- 1 5 Application of Medications - topical <PPIRJJOACZYSAYTK>_1<\/SWFUXNATFTDDUKGU>_5  - 0 Application of Medications - injection INTERVENTIONS - Miscellaneous _5  - External ear exam 0 _6  - 0 Specimen Collection (cultures, biopsies, blood, body fluids, etc.) _7  - 0 Specimen(s) / Culture(s) sent or taken to Lab for analysis _8  - 0 Patient Transfer (multiple staff / Civil Service fast streamer / Similar devices) _9  - 0 Simple Staple / Suture removal (25 or less) _10  - 0 Complex Staple / Suture removal (26 or more) _11  - 0 Hypo / Hyperglycemic Management (close monitor of Blood Glucose) _12  - 0 Ankle / Brachial Index (ABI) - do not check if billed separately X- 1 5 Vital Signs Has the patient been seen at the hospital within the last three years: Yes Total Score: 110 Level Of Care: New/Established - Level 3 Electronic Signature(s) Signed: 12/27/2020 7:55:31 AM By: [name] Coria RN Entered By: [name] on 12/25/2020 16:13:13 Mamula, Jaynie [name] ([id number]) -------------------------------------------------------------------------------- Encounter Discharge Information Details Patient Name: [name] Date of Service: 12/25/2020 3:45 PM Medical Record Number: [id number] Patient Account Number: [id number] Date of Birth/Sex: 1950/07/01  (69 y.o. F) Treating RN: [name] Primary Care Chenise Mulvihill: [name] Other Clinician: Referring Phuong Moffatt: [name] Treating Saloma Cadena/Extender: [name] in Treatment: 22 Encounter Discharge Information Items Discharge Condition: Stable Ambulatory Status: Wheelchair Discharge Destination: Home Transportation: Private Auto Accompanied By: self Schedule Follow-up Appointment: Yes Clinical Summary of Care: Patient Declined Electronic Signature(s) Signed: 12/27/2020 7:55:31 AM By: [name] Coria RN Entered By: [name] on 12/25/2020 16:14:13 [name] ([id number]) -------------------------------------------------------------------------------- Lower Extremity Assessment Details Patient Name: [name] Date of Service: 12/25/2020 3:45 PM Medical Record Number: [id number] Patient Account Number: [id number] Date of Birth/Sex: 09/17/1950 (69 y.o. F) Treating RN: [name] Primary Care Tasheema Perrone: [name] Other Clinician: Referring Terea Neubauer: [name] Treating Velvie Thomaston/Extender: [name] in Treatment: 22 Edema Assessment Assessed: [Left: No] [Right: No] [Left: Edema] [Right: :] Calf Left: Right: Point of Measurement: 29 cm From Medial Instep 39 cm Ankle Left: Right: Point of Measurement: 10 cm From Medial Instep 20 cm Vascular Assessment Pulses: Dorsalis Pedis Palpable: [Left:Yes] Electronic Signature(s) Signed: 12/27/2020 7:55:31 AM By: [name] Coria RN Entered By: [name] on 12/25/2020 16:02:23 Jandreau, Jaynie [name] ([id number]) -------------------------------------------------------------------------------- Multi Wound Chart Details Patient Name: [name] Date of Service: 12/25/2020 3:45 PM Medical Record Number: [id number] Patient Account Number: [id number] Date of Birth/Sex: [birth year] (69 y.o. F) Treating RN: [name] Primary Care Zeena Starkel: [name] Other Clinician: Referring Vanna Sailer:  [name] Treating Ann Groeneveld/Extender: [name] in Treatment: 22 Vital Signs Height(in): 48 Pulse(bpm): 62 Weight(lbs): 61 Blood Pressure(mmHg): 134/77 Body Mass Index(BMI): 45 Temperature(F): 98.6 Respiratory Rate(breaths/min): 18 Photos: [N/A:N/A] Wound Location: Left Calcaneus Left, Midline, Anterior Lower Leg N/A Wounding Event: Pressure Injury Shear/Friction N/A Primary Etiology: Pressure Ulcer Trauma, Other N/A Comorbid History: Cataracts, Chronic Obstructive Cataracts, Chronic Obstructive N/A Pulmonary Disease (COPD), Sleep Pulmonary Disease (COPD), Sleep Apnea, Hypertension, Apnea, Hypertension, Osteoarthritis, Neuropathy Osteoarthritis, Neuropathy Date Acquired: 06/27/2020  11/24/2020 N/A Weeks of Treatment: 22 4 N/A Wound Status: Open Open N/A Measurements L x W x D (cm) 1.1x1.7x0.2 3.6x1x0.1 N/A Area (cm) : 1.469 2.827 N/A Volume (cm) : 0.294 0.283 N/A % Reduction in Area: 71.20% 30.80% N/A % Reduction in Volume: 71.20% 30.60% N/A Classification: Category/Stage III Partial Thickness N/A Exudate Amount: Medium Medium N/A Exudate Type: Serous Serosanguineous N/A Exudate Color: amber red, brown N/A Wound Margin: Thickened Indistinct, nonvisible N/A Granulation Amount: Large (67-100%) Large (67-100%) N/A Granulation Quality: Red, Pink Red N/A Necrotic Amount: Small (1-33%) Small (1-33%) N/A Exposed Structures: Fat Layer (Subcutaneous Tissue): Fat Layer (Subcutaneous Tissue): N/A Yes Yes Fascia: No Fascia: No Tendon: No Tendon: No Muscle: No Muscle: No Joint: No Joint: No Bone: No Bone: No Epithelialization: Small (1-33%) Medium (34-66%) N/A Treatment Notes Electronic Signature(s) Signed: 12/27/2020 7:55:31 AM By: [name] Coria RN Entered By: [name] on 12/25/2020 16:09:19 [name] ([id number]) -------------------------------------------------------------------------------- Multi-Disciplinary Care Plan Details Patient Name:  [name] Date of Service: 12/25/2020 3:45 PM Medical Record Number: [id number] Patient Account Number: [id number] Date of Birth/Sex: 03-26-1951 (69 y.o. F) Treating RN: [name] Primary Care Shamila Lerch: [name] Other Clinician: Referring Laquinton Bihm: [name] Treating Tykisha Areola/Extender: [name] in Treatment: 22 Active Inactive Wound/Skin Impairment Nursing Diagnoses: Impaired tissue integrity Goals: Patient/caregiver will verbalize understanding of skin care regimen Date Initiated: 07/20/2020 Date Inactivated: 09/04/2020 Target Resolution Date: 09/19/2020 Goal Status: Met Ulcer/skin breakdown will have a volume reduction of 30% by week 4 Date Initiated: 07/20/2020 Date Inactivated: 08/25/2020 Target Resolution Date: 08/20/2020 Goal Status: Met Ulcer/skin breakdown will have a volume reduction of 50% by week 8 Date Initiated: 07/20/2020 Date Inactivated: 10/09/2020 Target Resolution Date: 09/19/2020 Goal Status: Met Ulcer/skin breakdown will have a volume reduction of 80% by week 12 Date Initiated: 07/20/2020 Target Resolution Date: 10/20/2020 Goal Status: Active Ulcer/skin breakdown will heal within 14 weeks Date Initiated: 07/20/2020 Target Resolution Date: 11/19/2020 Goal Status: Active Interventions: Assess patient/caregiver ability to obtain necessary supplies Assess patient/caregiver ability to perform ulcer/skin care regimen upon admission and as needed Provide education on ulcer and skin care Treatment Activities: Referred to DME Dory Verdun for dressing supplies : 07/20/2020 Skin care regimen initiated : 07/20/2020 Notes: Electronic Signature(s) Signed: 12/27/2020 7:55:31 AM By: [name] Coria RN Entered By: [name] on 12/25/2020 16:08:58 [name] ([id number]) -------------------------------------------------------------------------------- Pain Assessment Details Patient Name: [name] Date of Service: 12/25/2020 3:45 PM Medical  Record Number: [id number] Patient Account Number: [id number] Date of Birth/Sex: 1951/01/11 (69 y.o. F) Treating RN: [name] Primary Care Nekoda Chock: [name] Other Clinician: Referring Melton Walls: [name] Treating Gildo Crisco/Extender: [name] in Treatment: 22 Active Problems Location of Pain Severity and Description of Pain Patient Has Paino No Site Locations Pain Management and Medication Current Pain Management: Electronic Signature(s) Signed: 12/27/2020 7:55:31 AM By: [name] Coria RN Entered By: [name] on 12/25/2020 15:50:22 [name] ([id number]) -------------------------------------------------------------------------------- Patient/Caregiver Education Details Patient Name: [name] Date of Service: 12/25/2020 3:45 PM Medical Record Number: [id number] Patient Account Number: [id number] Date of Birth/Gender: 01/26/1951 (69 y.o. F) Treating RN: [name] Primary Care Physician: [name] Other Clinician: Referring Physician: [name] Treating Physician/Extender: [name] in Treatment: 22 Education Assessment Education Provided To: Patient Education Topics Provided Wound/Skin Impairment: Methods: Explain/Verbal Responses: State content correctly Electronic Signature(s) Signed: 12/27/2020 7:55:31 AM By: [name] Coria RN Entered By: [name] on 12/25/2020 16:13:31 Glander, Jaynie [name] ([id number]) -------------------------------------------------------------------------------- Wound Assessment Details Patient Name: [name] Date  of Service: 12/25/2020 3:45 PM Medical Record Number: [id number] Patient Account Number: [id number] Date of Birth/Sex: June 02, 1950 (69 y.o. F) Treating RN: [name] Primary Care Bralyn Folkert: [name] Other Clinician: Referring Dellar Traber: [name] Treating Drucilla Cumber/Extender: [name] in Treatment: 22 Wound Status Wound Number: 12 Primary Pressure  Ulcer Etiology: Wound Location: Left Calcaneus Wound Open Wounding Event: Pressure Injury Status: Date Acquired: 06/27/2020 Comorbid Cataracts, Chronic Obstructive Pulmonary Disease Weeks Of Treatment: 22 History: (COPD), Sleep Apnea, Hypertension, Osteoarthritis, Clustered Wound: No Neuropathy Photos Wound Measurements Length: (cm) 1.1 Width: (cm) 1.7 Depth: (cm) 0.2 Area: (cm) 1.469 Volume: (cm) 0.294 % Reduction in Area: 71.2% % Reduction in Volume: 71.2% Epithelialization: Small (1-33%) Tunneling: No Undermining: No Wound Description Classification: Category/Stage III Wound Margin: Thickened Exudate Amount: Medium Exudate Type: Serous Exudate Color: amber Foul Odor After Cleansing: No Slough/Fibrino Yes Wound Bed Granulation Amount: Large (67-100%) Exposed Structure Granulation Quality: Red, Pink Fascia Exposed: No Necrotic Amount: Small (1-33%) Fat Layer (Subcutaneous Tissue) Exposed: Yes Necrotic Quality: Adherent Slough Tendon Exposed: No Muscle Exposed: No Joint Exposed: No Bone Exposed: No Treatment Notes Wound #12 (Calcaneus) Wound Laterality: Left Cleanser Soap and Water Discharge Instruction: Gently cleanse wound with antibacterial soap, rinse and pat dry prior to dressing wounds Jewkes, Amir H. ([id number]) Peri-Wound Care Topical Primary Dressing Hydrofera Blue Ready Transfer Foam, 4x5 (in/in) Discharge Instruction: Apply to wound bed-cut hydrofera about 2 cm bigger around for absorption. Ensure hydrofera blue touches wound bed Secondary Dressing Mepilex Border Flex, 4x4 (in/in) Discharge Instruction: Apply to wound as directed. Do not cut. Secured With Compression Wrap Compression Stockings Environmental education officer) Signed: 12/27/2020 7:55:31 AM By: [name] Coria RN Entered By: [name] on 12/25/2020 16:00:27 [name] ([id number]) -------------------------------------------------------------------------------- Wound  Assessment Details Patient Name: [name] Date of Service: 12/25/2020 3:45 PM Medical Record Number: [id number] Patient Account Number: [id number] Date of Birth/Sex: 09-01-1950 (69 y.o. F) Treating RN: [name] Primary Care Dietrick Barris: [name] Other Clinician: Referring Neri Samek: [name] Treating Raynie Steinhaus/Extender: [name] in Treatment: 22 Wound Status Wound Number: 13 Primary Trauma, Other Etiology: Wound Location: Left, Midline, Anterior Lower Leg Wound Open Wounding Event: Shear/Friction Status: Date Acquired: 11/24/2020 Comorbid Cataracts, Chronic Obstructive Pulmonary Disease Weeks Of Treatment: 4 History: (COPD), Sleep Apnea, Hypertension, Osteoarthritis, Clustered Wound: No Neuropathy Photos Wound Measurements Length: (cm) 3.6 Width: (cm) 1 Depth: (cm) 0.1 Area: (cm) 2.827 Volume: (cm) 0.283 % Reduction in Area: 30.8% % Reduction in Volume: 30.6% Epithelialization: Medium (34-66%) Wound Description Classification: Partial Thickness Wound Margin: Indistinct, nonvisible Exudate Amount: Medium Exudate Type: Serosanguineous Exudate Color: red, brown Foul Odor After Cleansing: No Slough/Fibrino Yes Wound Bed Granulation Amount: Large (67-100%) Exposed Structure Granulation Quality: Red Fascia Exposed: No Necrotic Amount: Small (1-33%) Fat Layer (Subcutaneous Tissue) Exposed: Yes Necrotic Quality: Adherent Slough Tendon Exposed: No Muscle Exposed: No Joint Exposed: No Bone Exposed: No Treatment Notes Wound #13 (Lower Leg) Wound Laterality: Left, Midline, Anterior Cleanser Soap and Water Discharge Instruction: Gently cleanse wound with antibacterial soap, rinse and pat dry prior to dressing wounds Bowery, Zephaniah H. ([id number]) Peri-Wound Care Topical Primary Dressing Hydrofera Blue Ready Transfer Foam, 4x5 (in/in) Discharge Instruction: Apply to wound bed-cut hydrofera about 2 cm bigger around for absorption. Ensure  hydrofera blue touches wound bed Secondary Dressing ABD Pad 5x9 (in/in) Discharge Instruction: Apply to secure hydrofera blue AND on shin area for protection Secured With Compression Wrap Profore Lite LF 3 Multilayer Compression Bandaging System Discharge Instruction: Apply 3 multi-layer wrap as prescribed. Compression  Stockings Environmental education officer) Signed: 12/27/2020 7:55:31 AM By: [name] Coria RN Entered By: [name] on 12/25/2020 16:01:04 [name] ([id number]) -------------------------------------------------------------------------------- Vitals Details Patient Name: [name] Date of Service: 12/25/2020 3:45 PM Medical Record Number: [id number] Patient Account Number: [id number] Date of Birth/Sex: Oct 11, 1950 (69 y.o. F) Treating RN: [name] Primary Care Etty Isaac: [name] Other Clinician: Referring Jovoni Borkenhagen: [name] Treating Killian Schwer/Extender: [name] in Treatment: 22 Vital Signs Time Taken: 15:49 Temperature (F): 98.6 Height (in): 64 Pulse (bpm): 90 Weight (lbs): 265 Respiratory Rate (breaths/min): 18 Body Mass Index (BMI): 45.5 Blood Pressure (mmHg): 134/77 Reference Range: 80 - 120 mg / dl Electronic Signature(s) Signed: 12/27/2020 7:55:31 AM By: [name] Coria RN Entered By: [name] on 12/25/2020 15:50:15

## 2021-01-02 ENCOUNTER — Other Ambulatory Visit: Payer: Self-pay

## 2021-01-02 ENCOUNTER — Encounter: Payer: Medicare HMO | Attending: Physician Assistant | Admitting: Physician Assistant

## 2021-01-02 DIAGNOSIS — I129 Hypertensive chronic kidney disease with stage 1 through stage 4 chronic kidney disease, or unspecified chronic kidney disease: Secondary | ICD-10-CM | POA: Insufficient documentation

## 2021-01-02 DIAGNOSIS — F17210 Nicotine dependence, cigarettes, uncomplicated: Secondary | ICD-10-CM | POA: Insufficient documentation

## 2021-01-02 DIAGNOSIS — N183 Chronic kidney disease, stage 3 unspecified: Secondary | ICD-10-CM | POA: Diagnosis not present

## 2021-01-02 DIAGNOSIS — I872 Venous insufficiency (chronic) (peripheral): Secondary | ICD-10-CM | POA: Insufficient documentation

## 2021-01-02 DIAGNOSIS — I890 Lymphedema, not elsewhere classified: Secondary | ICD-10-CM | POA: Insufficient documentation

## 2021-01-02 DIAGNOSIS — L97822 Non-pressure chronic ulcer of other part of left lower leg with fat layer exposed: Secondary | ICD-10-CM | POA: Insufficient documentation

## 2021-01-02 DIAGNOSIS — G629 Polyneuropathy, unspecified: Secondary | ICD-10-CM | POA: Insufficient documentation

## 2021-01-02 DIAGNOSIS — J449 Chronic obstructive pulmonary disease, unspecified: Secondary | ICD-10-CM | POA: Diagnosis not present

## 2021-01-02 DIAGNOSIS — L89623 Pressure ulcer of left heel, stage 3: Secondary | ICD-10-CM | POA: Diagnosis present

## 2021-01-02 DIAGNOSIS — M1990 Unspecified osteoarthritis, unspecified site: Secondary | ICD-10-CM | POA: Diagnosis not present

## 2021-01-02 DIAGNOSIS — Z86718 Personal history of other venous thrombosis and embolism: Secondary | ICD-10-CM | POA: Diagnosis not present

## 2021-01-02 DIAGNOSIS — G35 Multiple sclerosis: Secondary | ICD-10-CM | POA: Diagnosis not present

## 2021-01-02 NOTE — Progress Notes (Addendum)
MAYTAL, CROLEY ([id number]) Visit Report for 01/02/2021 Chief Complaint Document Details Patient Name: [name] Date of Service: 01/02/2021 1:15 PM Medical Record Number: [id number] Patient Account Number: [id number] Date of Birth/Sex: 1950-10-03 (70 y.o. F) Treating RN: Carlene Coria Primary Care Provider: [name] Other Clinician: Referring Provider: [name] Treating Provider/Extender: [name] in Treatment: 23 Information Obtained from: Patient Chief Complaint Left Heel Pressure Ulcer Electronic Signature(s) Signed: 01/02/2021 1:43:43 PM By: Worthy Keeler PA-C Entered By: Worthy Keeler on 01/02/2021 13:43:43 [name], [name] ([id number]) -------------------------------------------------------------------------------- Debridement Details Patient Name: [name] Date of Service: 01/02/2021 1:15 PM Medical Record Number: [id number] Patient Account Number: [id number] Date of Birth/Sex: [birth year] (70 y.o. F) Treating RN: Carlene Coria Primary Care Provider: [name] Other Clinician: Referring Provider: [name] Treating Provider/Extender: [name] in Treatment: 23 Debridement Performed for Wound #12 Left Calcaneus Assessment: Performed By: Physician Tommie Sams., PA-C Debridement Type: Debridement Level of Consciousness (Pre- Awake and Alert procedure): Pre-procedure Verification/Time Out Yes - 14:00 Taken: Start Time: 14:00 Pain Control: Lidocaine 4% Topical Solution Total Area Debrided (L x W): 1.5 (cm) x 1.5 (cm) = 2.25 (cm) Tissue and other material Viable, Non-Viable, Callus, Slough, Subcutaneous, Skin: Dermis , Skin: Epidermis, Slough debrided: Level: Skin/Subcutaneous Tissue Debridement Description: Excisional Instrument: Curette Bleeding: Moderate Hemostasis Achieved: Pressure End Time: 14:05 Procedural Pain: 0 Post Procedural Pain: 0 Response to Treatment: Procedure was tolerated well Level of  Consciousness (Post- Awake and Alert procedure): Post Debridement Measurements of Total Wound Length: (cm) 1.5 Stage: Category/Stage III Width: (cm) 1.5 Depth: (cm) 0.2 Volume: (cm) 0.353 Character of Wound/Ulcer Post Debridement: Improved Post Procedure Diagnosis Same as Pre-procedure Electronic Signature(s) Signed: 01/05/2021 11:43:44 AM By: Worthy Keeler PA-C Signed: 01/10/2021 7:54:52 AM By: Carlene Coria RN Entered By: Carlene Coria on 01/02/2021 14:18:35 [name] ([id number]) -------------------------------------------------------------------------------- HPI Details Patient Name: [name] Date of Service: 01/02/2021 1:15 PM Medical Record Number: [id number] Patient Account Number: [id number] Date of Birth/Sex: Dec 26, 1950 (70 y.o. F) Treating RN: Carlene Coria Primary Care Provider: [name] Other Clinician: Referring Provider: [name] Treating Provider/Extender: [name] in Treatment: 23 History of Present Illness HPI Description: 70 year old with a past medical history significant for MS, urinary incontinence, and obesity. She has been seen in the wound clinic before for lower extremity ulcerations treated with compression therapy. she is also known to have hypertension, peripheral vascular disease, COPD, obstructive sleep apnea, lumbar radiculopathy, kyphoscoliosis, urinary issues and tobacco abuse. Smokes a packet of cigarettes a day was recently seen at the [facility] for swelling of her legs and feet with a ulceration on the dorsum of the right foot which has been there for about 6 months. she was recently in the ER about a month ago where she was seen for shortness of breath and swelling of the legs and a chest x-ray was within normal limits had an increase in her BNP and was given Lasix and put on doxycycline for a mild cellulitis and possible UTI. Wounds aresmaller today 11/13/15. Debrided and will continue  Santyl. 12/21/2015 -- she was admitted to the hospital overnight on 12/18/2015 and diagnosed with urinary retention and cellulitis of the left lower leg. is past to take clindamycin and use Santyl for her wounds. 01/15/2016 -- come back to see [place] for almost a month and continues to be noncompliant with her dressings 01/30/16 patient presents today for a follow-up visit concerning her ongoing bilateral lower extremity wounds.  We have not seen her for the past 2 weeks although we are supposed to be seen her for weekly visits. She currently has a Foley catheter. She tells me that the wounds are intensely painful especially with pressure and palpation at this point in time. Fortunately she is having no interval signs or symptoms of systemic infection but unfortunately the wounds have not improved dramatically since we last saw her. She does have home health coming to take care of her as well. She is currently not in any compression wraps. 02/06/16 ON evaluation today patient continues to experience discomfort regarding her bilateral lower extremity ulcers. She has continued to tolerate the dressing changes at this point in time and continues to have a Foley catheter as well. Fortunately she is back this week in the past she has been somewhat noncompliant with follow-ups I'm glad to see her today. She tells me that her pain level varies but can be as high as a 7 out of 10 with manipulation of the wound. She tells me that she used to be on oxycodone which was managed by the pain clinic although she is no longer on that as she tells me that she was actually smoking marijuana at the time and when this was found out they discontinued her pain medication. She no longer is taking anything pain medication wise and she also does not smoke cigarettes nor marijuana at this point. 02/12/16; this is a patient I don't believe I have previously seen. She has multiple sclerosis. She has 4 punched-out areas on the anterior  lateral left leg 2 areas on the right these are all in the same condition. Reasonably small [dime size] wounds each was some depth. Surface of these wounds does not look particularly healthy as there is adherent slough. There is no evidence of surrounding infection or inflammation. ABIs in this clinic were 0.87 on the right and 0.81 on the left. She is listed as having PAD and is a smoker. Not a diabetic 02/20/16 patient I gave antibiotics to last week for erythema around both wound sites on the left lateral leg and right lateral leg. This appears to be a lot better. One of the areas on the left leg is healed however she still has 3 punched-out open areas on the left lateral calf and one on the right lateral calf and one on the dorsal foot. She is an ex-smoker quitting 1 month ago. ABIs in this clinic were 0.87 on the right 0.81 on the left 03/12/16; this is a patient I really don't have a good sense of. It would appear for the first 5 or 6 weeks of her stay in this clinic she was cared for by Dr. Con Memos. She appeared on my schedule in mid October and I saw her twice. She has not been here however in 3 weeks. She has advanced Wound Care at home where she lives with her husband. She has multiple sclerosis. I saw her the first time she had 4 punched-out small wounds on the anterior lateral left leg it appears that she is now down to 2. She also had wounds on the lateral and medial aspect of her right leg which were also small and punched out. The only one that remains is on the right lateral. Because of the nature of her wound I went ahead and ordered formal arterial studies this showed an ABI of 1 on the right and one on the left. TBI of 1.4 on the right and 0.79  on the left. She had normal waveforms. She was in the emergency room on 02/28/16; there is a noted edema of her left leg after a fall. She had a duplex ultrasound that showed no evidence for an acute DVT from the left groin to the popliteal  fossa. The study was limited in the calf veins due to edema. Also noticeable that she had a left inguinal enlarged lymph node up to 6.2 cm. She also had a left knee x-ray that showed no acute findings and a right foot x-ray that showed soft tissue swelling but no radiographic evidence of osteomyelitis. The patient does stop smoking 03-19-16 Ms Boddy presents today for evaluation of her bilateral lower extremity ulcerations. She states that she has not smoked in several weeks. She denies any pain or discomfort to bilateral lower extremities, tolerating compression therapy as ordered. 03/26/16; I have not seen this patient in 2 weeks however she has done very well with improvements in the areas in question. After we obtain normal arterial studies, increasing the 4-layer compression really seems to look done a nice job here. She has one open area on the left lateral leg and one on the medial right leg. These have filled in nicely and are now superficial wounds that showed epithelialized. I note the left inguinal lymph node at 6.2 cm. I may need to refer this back to the patient's primary doctor. 04/03/16; patient has 2 remaining wounds 1 on the left lateral leg and one on the right medial leg. Both of filled in nicely since we are able to increase her from a 3 layer to a 4 layer compression. I am also following up on the lymph node notable on her left leg DVT rule out in November 04/10/16; area on the right lateral leg is healed. The area on the left leg is still open but looks considerably better with healthy granulation and less wound area. I12/20/17; last week we healed the patient doubt with regards to the wound on the right leg to her on compression stocking 20-30 mmHg as it turns out I don't think she had a stocking, predictably therefore she has reopened. The left leg as well as the wound on the lateral aspect. Been generally small line 04/24/16; we healed out her right leg 2 weeks ago to her own  20-30 mm compression stockings although as it turns out she didn't have these and did not purchase some apparently because of financial issues. The left leg wound is on the lateral aspect. Both of these wounds are just about healed. 05/01/16; her wounds on the left leg are healed out today. The area on the right leg is also healed. Vitamin diligent effort of our staff we have not been able to get any form of compression stocking to this patient. The orders for juxtalite's were given to home health this never materialized. We ordered compression stockings I don't think she was able to afford these. We had a discussion today with both the patient and her husband without these, will accumulate edema and the wounds will simply reopen again. 05/14/16 READMISSION [name] ([id number]) this is a patient we healed out 2 weeks ago. As bilateral lower extremity venous wounds probably some degree of lymphedema. At the beginning in November I did a duplex ultrasound of her left leg that was negative for DVT but did show a lymph node in the left inguinal area presumably reactive. We've been using compression to her lower extremities eventually healed all her wounds  on her bilateral calves but we were not able to get any form of compression stockings for her in spite of intense efforts of our staff. She returns today with reopening on the left leg did not the right she has several superficial areas anteriorly but an actual frank ulcer on the lateral left calf. This is about the size of a dime or smaller. She does not really complain of pain. The patient has a history of PAD however arterial studies we did in November were normal. Her ABIs and Doppler waveforms were both within the normal limits. The patient has a history of MS. In discussing things with her today it would appear that the opening was noted 2 weeks ago according to the patient. We gave her Tubigrip stockings when she left the clinic 05/21/16; she  has not yet had the duplex ultrasound of her thigh, this is booked for Thursday. The wound on the left lateral lower leg is improved 05/28/16; her duplex ultrasound of the left leg specifically her left thigh did not show a DVT however did show hypoechogenic enlarged left inguinal lymph nodes up to 6.9 x 3.7 x 6.8. This also showed in the one in November however maximal diameter was 6.2, at that point I thought these were reactive secondary to cellulitis however there is obviously a differential here. Her wound on the left lower leg is closed. She has a superficial open area on the base of her left fifth metatarsal head on the plantar foot. It looks as though she has some wrap injuries on the anterior leg 06/05/16; since she was last year she had a fall on 1/31. She was seen in the ER but sent back home. I think she is also been seen by her primary physician who picked up on the swelling and the lymphadenopathy. She is ordered a CT scan of the abdomen and pelvis however this will be without contrast because of stage IIIB renal insufficiency. Nevertheless this should be a start the workup to make sure there is not is more systemic problem here. She will probably need consideration of a biopsy of the lymph node in her inguinal area, this would need general surgery. As far as her wounds are concern today the area on the left plantar foot is healed. She has a small weeping area on the left lower leg. The wrap injury from last week has largely Pardoxically there is a lot less edema in the left thigh. 06/12/16; CT scan is on Friday. She has 2 small open areas one on the lateral left lower leg and one on the anterior lower leg on the left. Both of these looks healthy. I reduced her compression last week to Kerlix and Coban this seems to have not a reasonable job 07/02/16; the patient is now in the skilled facility. I think this was arranged out of the ER when she fell at home although I'm not exactly sure. She is  going for I think a laparotomy for the pelvic mass next week. All the wounds in the left leg have healed Readmission: 07/20/2020 upon evaluation today patient presents for reevaluation here in clinic unfortunately she is having issues with a significant stage III pressure ulcer to her left heel. This is something that occurred when she was in the hospital sick. And subsequently though she does not appear to have any new injury she is still trying to recover from the pressure injury which was quite severe during that time. I do not see any  signs of infection right now which is great news. With that being said she has been require some debridement to try to clear this up as much as possible. We need to get the wound VAC cleared so that she can see overall healing and improvement that were looking for. The patient does have a history of hypertension, chronic kidney disease stage III, COPD, and multiple sclerosis. Currently she has been trying to do what she can to keep pressure off of her heel while in bed and in other locations as well. 08/04/20 upon evaluation today patient appears to be doing extremely well currently in regard to her wound on the heel. I do believe the Iodoflex has helped loosen things appear. Unfortunately she tells me that the home health nurse has not been putting the Iodoflex on the wound. That is news to [place] and that they tell me they got supplies in the mail but they did not see the Iodoflex being used. Nonetheless I am wondering if this is just something that they missed or if it truly has not been. I did tell them to look when they get home in their box of supplies to see if they saw the dressing which showed and what it should look like. 08/11/2020 upon evaluation today patient appears to be doing decently well in regard to her wound although unfortunately this is showing signs of some blue-green drainage consistent with Pseudomonas it does have a little worried in this regard.  Fortunately there is no evidence of active infection which is great news. No fevers, chills, nausea, vomiting, or diarrhea. 08/18/2020 upon evaluation today patient appears to be doing well with regard to her wound. She has been tolerating the dressing changes without complication. Fortunately there is no signs of active infection at this time. No fevers, chills, nausea, vomiting, or diarrhea. 4/29; left heel wound pressure ulcer. Slightly smaller. 09/04/2020 upon evaluation today patient appears to be doing well with regard to her wound. This is actually on her heel and seems to be doing well other than the fact that is a little bit moist from excessive drainage. There does not appear to be any signs of active infection at this time. No fevers, chills, nausea, vomiting, or diarrhea.. 09/11/2020 upon evaluation today patient appears to be doing okay in regard to her wound. With that being said she still has quite a bit of drainage noted at this point. Fortunately there is no signs of active infection which is great news overall I am extremely pleased with where things stand today. I do think we need to try to do something a little bit more to help control the drainage a bit better. 5/23; left posterior heel. This actually looks quite good. Using Hydrofera Blue under compression. Dimensions are smaller 09/26/2020 upon evaluation today patient appears to be doing well with regard to her heel ulcer. In fact this is showing signs of excellent improvement and overall I am extremely pleased with where things stand today. I do not see any evidence of infection which is great news and overall I feel like she is headed in the correct direction. 10/09/2020 upon evaluation today patient appears to be doing excellent in regard to her wound. She has been tolerating the dressing changes without complication. Fortunately there is no evidence of active infection which is also excellent news. No fevers, chills, nausea,  vomiting, or diarrhea. 10/16/2020 upon evaluation today patient appears to be doing well with regard to her heel ulcer. Unfortunately she is  having issues with an area of deep tissue injury/pressure injury over the shin due to the wrap being on way too tight today. Fortunately there does not appear to be any signs of infection currently. No fevers, chills, nausea, vomiting, or diarrhea. 10/23/2020 upon evaluation today patient appears to be doing better in regard to her wound. Fortunately there does not appear to be any signs of active infection at this time which is great news. In general I am extremely pleased with where things stand at this point. The patient does not show any signs of worsening which is excellent. She unfortunately still has the area in question with regard to the region over the anterior portion of her shin where she did have some deep tissue injury. Some of this is starting to open up a little bit. With that being said I think the biggest thing we need to do at this point is to try to as much as possible monitor for any additional breakdown and protect the region while it heals up. I think it is actually doing quite well which is great news. Fortunately there does not appear to be any evidence of active infection 11/14/2018 upon evaluation today patient appears to be doing okay in regard to her ischial. Her leg is much more swollen though she has not had a wrap on any type of dressing for some time when home health came out they did not have the Tubigrip. We have not actually seen her since June 27. The patient when I asked her about it seemed to get somewhat upset to be honest. She seems to be having some trouble at home. She states that she had to "raise hell" just to be able to get here to this appointment today. With that being said I really do think she needs to have [place], Vanette H. ([id number]) this wraps and keep the swelling under control and was doing a whole lot better  but now seems to be not doing quite as well to be honest. The anterior portion of her shin does seem to be healing and doing better although it did end up opening into the wound. Again that was where home health the wrap does have a little bit too tight causing this issue previously. 11/27/2020 upon evaluation today patient appears to be doing poorly in regard to her anterior shin where she did have some area where the wrap actually aggravated the spot. With that being said I think that we do need to make sure to pad this extremely well there is already some signs of healing at this point. I did believe this was probably can open even when I saw it last week and to be honest it has opened. The heel also is showing signs of improvement I do see some slough noted but nothing too significant which is great news. No fevers, chills, nausea, vomiting, or diarrhea. 12/07/2020 upon evaluation today patient appears to be doing decently well in regard to her wounds. Both are showing signs of some slough and biofilm buildup on the surface of the wounds. Fortunately there is no signs of active infection at this time which is great news. No fevers, chills, nausea, vomiting, or diarrhea. 12/15/2020 upon evaluation today patient appears to be doing well with regard to her wounds. She is showing signs of improvement at both locations which is great news. Fortunately I do not see any evidence of active infection at this time which is great as well. 12/25/2020 upon evaluation today  patient appears to be doing well with regard to her wound on the posterior heel. This seems to be making some progress which is great news. Unfortunately the biggest issue is that of the shin which again just does not seem to be healing as effectively and quickly as I would like to see. Fortunately there does not appear to be any evidence of active infection which is great news I do believe that we can try to do something a little different  using Tubigrip instead of the compression wrapping to see if this will keep pressure off of the shin allow this to heal more effectively and quickly. 01/02/2021 upon evaluation today patient's wound on the shin actually appears to be making excellent progress which is great news and overall extremely pleased with where things stand today. There is no sign of active infection at this time. No fevers, chills, nausea, vomiting, or diarrhea. Her heel is going require some debridement but overall does not appear to be doing too poorly. Electronic Signature(s) Signed: 01/02/2021 2:14:40 PM By: Worthy Keeler PA-C Entered By: Worthy Keeler on 01/02/2021 14:14:40 [name] ([id number]) -------------------------------------------------------------------------------- Physical Exam Details Patient Name: [name] Date of Service: 01/02/2021 1:15 PM Medical Record Number: [id number] Patient Account Number: [id number] Date of Birth/Sex: 1951-01-25 (70 y.o. F) Treating RN: Carlene Coria Primary Care Provider: [name] Other Clinician: Referring Provider: [name] Treating Provider/Extender: [name] in Treatment: 51 Constitutional Well-nourished and well-hydrated in no acute distress. Respiratory normal breathing without difficulty. Psychiatric this patient is able to make decisions and demonstrates good insight into disease process. Alert and Oriented x 3. pleasant and cooperative. Notes Upon inspection patient's wound bed showed signs of good granulation epithelization at this point. Fortunately there does not appear to be any evidence of infection systemically which is great news and overall I am extremely pleased with where things stand today. No fevers, chills, nausea, vomiting, or diarrhea. Electronic Signature(s) Signed: 01/02/2021 2:14:58 PM By: Worthy Keeler PA-C Entered By: Worthy Keeler on 01/02/2021 14:14:57 [name]  ([id number]) -------------------------------------------------------------------------------- Physician Orders Details Patient Name: [name] Date of Service: 01/02/2021 1:15 PM Medical Record Number: [id number] Patient Account Number: [id number] Date of Birth/Sex: 10/08/1950 (70 y.o. F) Treating RN: Carlene Coria Primary Care Provider: [name] Other Clinician: Referring Provider: [name] Treating Provider/Extender: [name] in Treatment: 23 Verbal / Phone Orders: No Diagnosis Coding ICD-10 Coding Code Description [phone] Pressure ulcer of left heel, stage 3 L97.822 Non-pressure chronic ulcer of other part of left lower leg with fat layer exposed I10 Essential (primary) hypertension N18.30 Chronic kidney disease, stage 3 unspecified J44.9 Chronic obstructive pulmonary disease, unspecified G35 Multiple sclerosis Follow-up Appointments o Return Appointment in 1 week. [place]: - [facility] [place] for wound care. May utilize formulary equivalent dressing for wound treatment orders unless otherwise specified. Home Health Nurse may visit PRN to address patientos wound care needs. o Scheduled days for dressing changes to be completed; exception, patient has scheduled wound care visit that day. o **Please direct any NON-WOUND related issues/requests for orders to patient's Primary Care Physician. **If current dressing causes regression in wound condition, may D/C ordered dressing product/s and apply Normal Saline Moist Dressing daily until next [place] or Other MD appointment. **Notify [facility] of regression in wound condition at [phone]. Edema Control - Lymphedema / Segmental Compressive Device / Other Bilateral Lower Extremities o Tubigrip  single layer applied. - size D o Elevate, Exercise Daily and Avoid Standing for Long Periods of Time. o Elevate legs to the level of  the heart and pump ankles as often as possible - ELEVATE LEGS o Elevate leg(s) parallel to the floor when sitting. Off-Loading Left Lower Extremity o Open toe surgical shoe o Turn and reposition every 2 hours o Other: - Keep pressure off left heel Wound Treatment Wound #12 - Calcaneus Wound Laterality: Left Cleanser: Soap and Water 2 x Per Week/30 Days Discharge Instructions: Gently cleanse wound with antibacterial soap, rinse and pat dry prior to dressing wounds Primary Dressing: Hydrofera Blue Ready Transfer Foam, 4x5 (in/in) (Generic) 2 x Per Week/30 Days Discharge Instructions: Apply to wound bed-cut hydrofera about 2 cm bigger around for absorption. Ensure hydrofera blue touches wound bed Secondary Dressing: Mepilex Border Flex, 4x4 (in/in) 2 x Per Week/30 Days Discharge Instructions: Apply to wound as directed. Do not cut. Wound #13 - Lower Leg Wound Laterality: Left, Midline, Anterior Cleanser: Soap and Water 2 x Per Week/30 Days Discharge Instructions: Gently cleanse wound with antibacterial soap, rinse and pat dry prior to dressing wounds Primary Dressing: Hydrofera Blue Ready Transfer Foam, 4x5 (in/in) (Generic) 2 x Per Week/30 Days Discharge Instructions: Apply to wound bed-cut hydrofera about 2 cm bigger around for absorption. Ensure hydrofera blue touches wound bed [name], [name] H. ([id number]) Secondary Dressing: Mepilex Border Flex, 4x4 (in/in) 2 x Per Week/30 Days Discharge Instructions: Apply to wound as directed. Do not cut. Electronic Signature(s) Signed: 01/05/2021 11:43:44 AM By: Worthy Keeler PA-C Signed: 01/10/2021 7:54:52 AM By: Carlene Coria RN Entered By: Carlene Coria on 01/02/2021 14:16:55 [name] -------------------------------------------------------------------------------- Problem List Details Patient Name: [name] Date of Service: 01/02/2021 1:15 PM Medical Record Number: [id number] Patient Account Number: [id number] Date  of Birth/Sex: 06-27-1950 (70 y.o. F) Treating RN: Carlene Coria Primary Care Provider: [name] Other Clinician: Referring Provider: [name] Treating Provider/Extender: [name] in Treatment: 23 Active Problems ICD-10 Encounter Code Description Active Date MDM Diagnosis L89.623 Pressure ulcer of left heel, stage 3 07/20/2020 No Yes I87.2 Venous insufficiency (chronic) (peripheral) 01/02/2021 No Yes L97.822 Non-pressure chronic ulcer of other part of left lower leg with fat layer 11/27/2020 No Yes exposed [place] (primary) hypertension 07/20/2020 No Yes N18.30 Chronic kidney disease, stage 3 unspecified 07/20/2020 No Yes J44.9 Chronic obstructive pulmonary disease, unspecified 07/20/2020 No Yes G35 Multiple sclerosis 07/20/2020 No Yes Inactive Problems Resolved Problems Electronic Signature(s) Signed: 01/02/2021 2:47:28 PM By: Worthy Keeler PA-C Previous Signature: 01/02/2021 1:43:33 PM Version By: Worthy Keeler PA-C Entered By: Worthy Keeler on 01/02/2021 14:47:28 [name] -------------------------------------------------------------------------------- Progress Note Details Patient Name: [name] Date of Service: 01/02/2021 1:15 PM Medical Record Number: [id number] Patient Account Number: [id number] Date of Birth/Sex: 04-11-1951 (70 y.o. F) Treating RN: Carlene Coria Primary Care Provider: [name] Other Clinician: Referring Provider: [name] Treating Provider/Extender: [name] in Treatment: 23 Subjective Chief Complaint Information obtained from Patient Left Heel Pressure Ulcer History of Present Illness (HPI) 70 year old with a past medical history significant for MS, urinary incontinence, and obesity. She has been seen in the wound clinic before for lower extremity ulcerations treated with compression therapy. she is also known to have hypertension, peripheral vascular disease,  COPD, obstructive sleep apnea, lumbar radiculopathy, kyphoscoliosis, urinary issues and tobacco abuse. Smokes a packet of cigarettes a day was recently seen at the [facility] for swelling of her  legs and feet with a ulceration on the dorsum of the right foot which has been there for about 6 months. she was recently in the ER about a month ago where she was seen for shortness of breath and swelling of the legs and a chest x-ray was within normal limits had an increase in her BNP and was given Lasix and put on doxycycline for a mild cellulitis and possible UTI. Wounds aresmaller today 11/13/15. Debrided and will continue Santyl. 12/21/2015 -- she was admitted to the hospital overnight on 12/18/2015 and diagnosed with urinary retention and cellulitis of the left lower leg. is past to take clindamycin and use Santyl for her wounds. 01/15/2016 -- come back to see [place] for almost a month and continues to be noncompliant with her dressings 01/30/16 patient presents today for a follow-up visit concerning her ongoing bilateral lower extremity wounds. We have not seen her for the past 2 weeks although we are supposed to be seen her for weekly visits. She currently has a Foley catheter. She tells me that the wounds are intensely painful especially with pressure and palpation at this point in time. Fortunately she is having no interval signs or symptoms of systemic infection but unfortunately the wounds have not improved dramatically since we last saw her. She does have home health coming to take care of her as well. She is currently not in any compression wraps. 02/06/16 ON evaluation today patient continues to experience discomfort regarding her bilateral lower extremity ulcers. She has continued to tolerate the dressing changes at this point in time and continues to have a Foley catheter as well. Fortunately she is back this week in the past she has been somewhat noncompliant with follow-ups I'm  glad to see her today. She tells me that her pain level varies but can be as high as a 7 out of 10 with manipulation of the wound. She tells me that she used to be on oxycodone which was managed by the pain clinic although she is no longer on that as she tells me that she was actually smoking marijuana at the time and when this was found out they discontinued her pain medication. She no longer is taking anything pain medication wise and she also does not smoke cigarettes nor marijuana at this point. 02/12/16; this is a patient I don't believe I have previously seen. She has multiple sclerosis. She has 4 punched-out areas on the anterior lateral left leg 2 areas on the right these are all in the same condition. Reasonably small [dime size] wounds each was some depth. Surface of these wounds does not look particularly healthy as there is adherent slough. There is no evidence of surrounding infection or inflammation. ABIs in this clinic were 0.87 on the right and 0.81 on the left. She is listed as having PAD and is a smoker. Not a diabetic 02/20/16 patient I gave antibiotics to last week for erythema around both wound sites on the left lateral leg and right lateral leg. This appears to be a lot better. One of the areas on the left leg is healed however she still has 3 punched-out open areas on the left lateral calf and one on the right lateral calf and one on the dorsal foot. She is an ex-smoker quitting 1 month ago. ABIs in this clinic were 0.87 on the right 0.81 on the left 03/12/16; this is a patient I really don't have a good sense of. It would appear for the first 5  or 6 weeks of her stay in this clinic she was cared for by Dr. Con Memos. She appeared on my schedule in mid October and I saw her twice. She has not been here however in 3 weeks. She has advanced Wound Care at home where she lives with her husband. She has multiple sclerosis. I saw her the first time she had 4 punched-out small wounds on  the anterior lateral left leg it appears that she is now down to 2. She also had wounds on the lateral and medial aspect of her right leg which were also small and punched out. The only one that remains is on the right lateral. Because of the nature of her wound I went ahead and ordered formal arterial studies this showed an ABI of 1 on the right and one on the left. TBI of 1.4 on the right and 0.79 on the left. She had normal waveforms. She was in the emergency room on 02/28/16; there is a noted edema of her left leg after a fall. She had a duplex ultrasound that showed no evidence for an acute DVT from the left groin to the popliteal fossa. The study was limited in the calf veins due to edema. Also noticeable that she had a left inguinal enlarged lymph node up to 6.2 cm. She also had a left knee x-ray that showed no acute findings and a right foot x-ray that showed soft tissue swelling but no radiographic evidence of osteomyelitis. The patient does stop smoking 03-19-16 Ms Paulette presents today for evaluation of her bilateral lower extremity ulcerations. She states that she has not smoked in several weeks. She denies any pain or discomfort to bilateral lower extremities, tolerating compression therapy as ordered. 03/26/16; I have not seen this patient in 2 weeks however she has done very well with improvements in the areas in question. After we obtain normal arterial studies, increasing the 4-layer compression really seems to look done a nice job here. She has one open area on the left lateral leg and one on the medial right leg. These have filled in nicely and are now superficial wounds that showed epithelialized. I note the left inguinal lymph node at 6.2 cm. I may need to refer this back to the patient's primary doctor. 04/03/16; patient has 2 remaining wounds 1 on the left lateral leg and one on the right medial leg. Both of filled in nicely since we are able to increase her from a 3 layer to a 4  layer compression. I am also following up on the lymph node notable on her left leg DVT rule out in November 04/10/16; area on the right lateral leg is healed. The area on the left leg is still open but looks considerably better with healthy granulation and less wound area. I12/20/17; last week we healed the patient doubt with regards to the wound on the right leg to her on compression stocking 20-30 mmHg as it turns out I don't think she had a stocking, predictably therefore she has reopened. The left leg as well as the wound on the lateral aspect. Been generally small line 04/24/16; we healed out her right leg 2 weeks ago to her own 20-30 mm compression stockings although as it turns out she didn't have these and did not purchase some apparently because of financial issues. The left leg wound is on the lateral aspect. Both of these wounds are just about healed. 05/01/16; her wounds on the left leg are healed out today. The  area on the right leg is also healed. Vitamin diligent effort of our staff we have not been able to get any form of compression stocking to this patient. The orders for juxtalite's were given to home health this never materialized. We Chambless, Leana H. ([id number]) ordered compression stockings I don't think she was able to afford these. We had a discussion today with both the patient and her husband without these, will accumulate edema and the wounds will simply reopen again. 05/14/16 READMISSION this is a patient we healed out 2 weeks ago. As bilateral lower extremity venous wounds probably some degree of lymphedema. At the beginning in November I did a duplex ultrasound of her left leg that was negative for DVT but did show a lymph node in the left inguinal area presumably reactive. We've been using compression to her lower extremities eventually healed all her wounds on her bilateral calves but we were not able to get any form of compression stockings for her in spite of intense  efforts of our staff. She returns today with reopening on the left leg did not the right she has several superficial areas anteriorly but an actual frank ulcer on the lateral left calf. This is about the size of a dime or smaller. She does not really complain of pain. The patient has a history of PAD however arterial studies we did in November were normal. Her ABIs and Doppler waveforms were both within the normal limits. The patient has a history of MS. In discussing things with her today it would appear that the opening was noted 2 weeks ago according to the patient. We gave her Tubigrip stockings when she left the clinic 05/21/16; she has not yet had the duplex ultrasound of her thigh, this is booked for Thursday. The wound on the left lateral lower leg is improved 05/28/16; her duplex ultrasound of the left leg specifically her left thigh did not show a DVT however did show hypoechogenic enlarged left inguinal lymph nodes up to 6.9 x 3.7 x 6.8. This also showed in the one in November however maximal diameter was 6.2, at that point I thought these were reactive secondary to cellulitis however there is obviously a differential here. Her wound on the left lower leg is closed. She has a superficial open area on the base of her left fifth metatarsal head on the plantar foot. It looks as though she has some wrap injuries on the anterior leg 06/05/16; since she was last year she had a fall on 1/31. She was seen in the ER but sent back home. I think she is also been seen by her primary physician who picked up on the swelling and the lymphadenopathy. She is ordered a CT scan of the abdomen and pelvis however this will be without contrast because of stage IIIB renal insufficiency. Nevertheless this should be a start the workup to make sure there is not is more systemic problem here. She will probably need consideration of a biopsy of the lymph node in her inguinal area, this would need general surgery. As far as  her wounds are concern today the area on the left plantar foot is healed. She has a small weeping area on the left lower leg. The wrap injury from last week has largely Pardoxically there is a lot less edema in the left thigh. 06/12/16; CT scan is on Friday. She has 2 small open areas one on the lateral left lower leg and one on the anterior lower leg on  the left. Both of these looks healthy. I reduced her compression last week to Kerlix and Coban this seems to have not a reasonable job 07/02/16; the patient is now in the skilled facility. I think this was arranged out of the ER when she fell at home although I'm not exactly sure. She is going for I think a laparotomy for the pelvic mass next week. All the wounds in the left leg have healed Readmission: 07/20/2020 upon evaluation today patient presents for reevaluation here in clinic unfortunately she is having issues with a significant stage III pressure ulcer to her left heel. This is something that occurred when she was in the hospital sick. And subsequently though she does not appear to have any new injury she is still trying to recover from the pressure injury which was quite severe during that time. I do not see any signs of infection right now which is great news. With that being said she has been require some debridement to try to clear this up as much as possible. We need to get the wound VAC cleared so that she can see overall healing and improvement that were looking for. The patient does have a history of hypertension, chronic kidney disease stage III, COPD, and multiple sclerosis. Currently she has been trying to do what she can to keep pressure off of her heel while in bed and in other locations as well. 08/04/20 upon evaluation today patient appears to be doing extremely well currently in regard to her wound on the heel. I do believe the Iodoflex has helped loosen things appear. Unfortunately she tells me that the home health nurse has not  been putting the Iodoflex on the wound. That is news to [place] and that they tell me they got supplies in the mail but they did not see the Iodoflex being used. Nonetheless I am wondering if this is just something that they missed or if it truly has not been. I did tell them to look when they get home in their box of supplies to see if they saw the dressing which showed and what it should look like. 08/11/2020 upon evaluation today patient appears to be doing decently well in regard to her wound although unfortunately this is showing signs of some blue-green drainage consistent with Pseudomonas it does have a little worried in this regard. Fortunately there is no evidence of active infection which is great news. No fevers, chills, nausea, vomiting, or diarrhea. 08/18/2020 upon evaluation today patient appears to be doing well with regard to her wound. She has been tolerating the dressing changes without complication. Fortunately there is no signs of active infection at this time. No fevers, chills, nausea, vomiting, or diarrhea. 4/29; left heel wound pressure ulcer. Slightly smaller. 09/04/2020 upon evaluation today patient appears to be doing well with regard to her wound. This is actually on her heel and seems to be doing well other than the fact that is a little bit moist from excessive drainage. There does not appear to be any signs of active infection at this time. No fevers, chills, nausea, vomiting, or diarrhea.. 09/11/2020 upon evaluation today patient appears to be doing okay in regard to her wound. With that being said she still has quite a bit of drainage noted at this point. Fortunately there is no signs of active infection which is great news overall I am extremely pleased with where things stand today. I do think we need to try to do something a little bit  more to help control the drainage a bit better. 5/23; left posterior heel. This actually looks quite good. Using Hydrofera Blue under  compression. Dimensions are smaller 09/26/2020 upon evaluation today patient appears to be doing well with regard to her heel ulcer. In fact this is showing signs of excellent improvement and overall I am extremely pleased with where things stand today. I do not see any evidence of infection which is great news and overall I feel like she is headed in the correct direction. 10/09/2020 upon evaluation today patient appears to be doing excellent in regard to her wound. She has been tolerating the dressing changes without complication. Fortunately there is no evidence of active infection which is also excellent news. No fevers, chills, nausea, vomiting, or diarrhea. 10/16/2020 upon evaluation today patient appears to be doing well with regard to her heel ulcer. Unfortunately she is having issues with an area of deep tissue injury/pressure injury over the shin due to the wrap being on way too tight today. Fortunately there does not appear to be any signs of infection currently. No fevers, chills, nausea, vomiting, or diarrhea. 10/23/2020 upon evaluation today patient appears to be doing better in regard to her wound. Fortunately there does not appear to be any signs of active infection at this time which is great news. In general I am extremely pleased with where things stand at this point. The patient does not show any signs of worsening which is excellent. She unfortunately still has the area in question with regard to the region over the anterior portion of her shin where she did have some deep tissue injury. Some of this is starting to open up a little bit. With that being said I think the biggest thing we need to do at this point is to try to as much as possible monitor for any additional breakdown and protect the region while it heals up. I think it is actually doing quite well which is great news. Fortunately there does not appear to be any evidence of active infection [name].  ([id number]) 11/14/2018 upon evaluation today patient appears to be doing okay in regard to her ischial. Her leg is much more swollen though she has not had a wrap on any type of dressing for some time when home health came out they did not have the Tubigrip. We have not actually seen her since June 27. The patient when I asked her about it seemed to get somewhat upset to be honest. She seems to be having some trouble at home. She states that she had to "raise hell" just to be able to get here to this appointment today. With that being said I really do think she needs to have this wraps and keep the swelling under control and was doing a whole lot better but now seems to be not doing quite as well to be honest. The anterior portion of her shin does seem to be healing and doing better although it did end up opening into the wound. Again that was where home health the wrap does have a little bit too tight causing this issue previously. 11/27/2020 upon evaluation today patient appears to be doing poorly in regard to her anterior shin where she did have some area where the wrap actually aggravated the spot. With that being said I think that we do need to make sure to pad this extremely well there is already some signs of healing at this point. I did believe this was  probably can open even when I saw it last week and to be honest it has opened. The heel also is showing signs of improvement I do see some slough noted but nothing too significant which is great news. No fevers, chills, nausea, vomiting, or diarrhea. 12/07/2020 upon evaluation today patient appears to be doing decently well in regard to her wounds. Both are showing signs of some slough and biofilm buildup on the surface of the wounds. Fortunately there is no signs of active infection at this time which is great news. No fevers, chills, nausea, vomiting, or diarrhea. 12/15/2020 upon evaluation today patient appears to be doing well with regard to  her wounds. She is showing signs of improvement at both locations which is great news. Fortunately I do not see any evidence of active infection at this time which is great as well. 12/25/2020 upon evaluation today patient appears to be doing well with regard to her wound on the posterior heel. This seems to be making some progress which is great news. Unfortunately the biggest issue is that of the shin which again just does not seem to be healing as effectively and quickly as I would like to see. Fortunately there does not appear to be any evidence of active infection which is great news I do believe that we can try to do something a little different using Tubigrip instead of the compression wrapping to see if this will keep pressure off of the shin allow this to heal more effectively and quickly. 01/02/2021 upon evaluation today patient's wound on the shin actually appears to be making excellent progress which is great news and overall extremely pleased with where things stand today. There is no sign of active infection at this time. No fevers, chills, nausea, vomiting, or diarrhea. Her heel is going require some debridement but overall does not appear to be doing too poorly. Objective Constitutional Well-nourished and well-hydrated in no acute distress. Vitals Time Taken: 1:45 PM, Height: 64 in, Weight: 265 lbs, BMI: 45.5, Temperature: 98.6 F, Pulse: 78 bpm, Respiratory Rate: 18 breaths/min, Blood Pressure: 131/69 mmHg. Respiratory normal breathing without difficulty. Psychiatric this patient is able to make decisions and demonstrates good insight into disease process. Alert and Oriented x 3. pleasant and cooperative. General Notes: Upon inspection patient's wound bed showed signs of good granulation epithelization at this point. Fortunately there does not appear to be any evidence of infection systemically which is great news and overall I am extremely pleased with where things stand today.  No fevers, chills, nausea, vomiting, or diarrhea. Integumentary (Hair, Skin) Wound #12 status is Open. Original cause of wound was Pressure Injury. The date acquired was: 06/27/2020. The wound has been in treatment 23 weeks. The wound is located on the Left Calcaneus. The wound measures 1.5cm length x 1.5cm width x 0.2cm depth; 1.767cm^2 area and 0.353cm^3 volume. There is Fat Layer (Subcutaneous Tissue) exposed. There is no tunneling or undermining noted. There is a medium amount of serous drainage noted. The wound margin is thickened. There is large (67-100%) red, pink granulation within the wound bed. There is a small (1-33%) amount of necrotic tissue within the wound bed including Adherent Slough. Wound #13 status is Open. Original cause of wound was Shear/Friction. The date acquired was: 11/24/2020. The wound has been in treatment 5 weeks. The wound is located on the Left,Midline,Anterior Lower Leg. The wound measures 2.7cm length x 0.6cm width x 0.1cm depth; 1.272cm^2 area and 0.127cm^3 volume. There is Fat Layer (Subcutaneous Tissue)  exposed. There is no tunneling or undermining noted. There is a medium amount of serosanguineous drainage noted. The wound margin is indistinct and nonvisible. There is large (67-100%) red granulation within the wound bed. There is a small (1-33%) amount of necrotic tissue within the wound bed including Adherent Slough. Assessment [name]. ([id number]) Active Problems ICD-10 Pressure ulcer of left heel, stage 3 Venous insufficiency (chronic) (peripheral) Non-pressure chronic ulcer of other part of left lower leg with fat layer exposed Essential (primary) hypertension Chronic kidney disease, stage 3 unspecified Chronic obstructive pulmonary disease, unspecified Multiple sclerosis Procedures Wound #12 Pre-procedure diagnosis of Wound #12 is a Pressure Ulcer located on the Left Calcaneus . There was a Excisional Skin/Subcutaneous Tissue Debridement with  a total area of 2.25 sq cm performed by Tommie Sams., PA-C. With the following instrument(s): Curette to remove Viable and Non-Viable tissue/material. Material removed includes Callus, Subcutaneous Tissue, Slough, Skin: Dermis, and Skin: Epidermis after achieving pain control using Lidocaine 4% Topical Solution. No specimens were taken. A time out was conducted at 14:00, prior to the start of the procedure. A Moderate amount of bleeding was controlled with Pressure. The procedure was tolerated well with a pain level of 0 throughout and a pain level of 0 following the procedure. Post Debridement Measurements: 1.5cm length x 1.5cm width x 0.2cm depth; 0.353cm^3 volume. Post debridement Stage noted as Category/Stage III. Character of Wound/Ulcer Post Debridement is improved. Post procedure Diagnosis Wound #12: Same as Pre-Procedure Plan Follow-up Appointments: Return Appointment in 1 week. Home Health: [facility] for wound care. May utilize formulary equivalent dressing for wound treatment orders unless otherwise specified. Home Health Nurse may visit [facility] to address patient s wound care needs. Scheduled days for dressing changes to be completed; exception, patient has scheduled wound care visit that day. **Please direct any NON-WOUND related issues/requests for orders to patient's Primary Care Physician. **If current dressing causes regression in wound condition, may D/C ordered dressing product/s and apply Normal Saline Moist Dressing daily until next [place] or Other MD appointment. **Notify [facility] of regression in wound condition at [phone]. Edema Control - Lymphedema / Segmental Compressive Device / Other: Tubigrip single layer applied. - size D Elevate, Exercise Daily and Avoid Standing for Long Periods of Time. Elevate legs to the level of the heart and pump ankles as often as possible - ELEVATE LEGS Elevate leg(s)  parallel to the floor when sitting. Off-Loading: Open toe surgical shoe Turn and reposition every 2 hours Other: - Keep pressure off left heel WOUND #12: - Calcaneus Wound Laterality: Left Cleanser: Soap and Water 2 x Per Week/30 Days Discharge Instructions: Gently cleanse wound with antibacterial soap, rinse and pat dry prior to dressing wounds Primary Dressing: Hydrofera Blue Ready Transfer Foam, 4x5 (in/in) (Generic) 2 x Per Week/30 Days Discharge Instructions: Apply to wound bed-cut hydrofera about 2 cm bigger around for absorption. Ensure hydrofera blue touches wound bed Secondary Dressing: Mepilex Border Flex, 4x4 (in/in) 2 x Per Week/30 Days Discharge Instructions: Apply to wound as directed. Do not cut. WOUND #13: - Lower Leg Wound Laterality: Left, Midline, Anterior Cleanser: Soap and Water 2 x Per Week/30 Days Discharge Instructions: Gently cleanse wound with antibacterial soap, rinse and pat dry prior to dressing wounds Primary Dressing: Hydrofera Blue Ready Transfer Foam, 4x5 (in/in) (Generic) 2 x Per Week/30 Days Discharge Instructions: Apply to wound bed-cut hydrofera about 2 cm bigger around for absorption. Ensure hydrofera blue  touches wound bed Secondary Dressing: Mepilex Border Flex, 4x4 (in/in) 2 x Per Week/30 Days Discharge Instructions: Apply to wound as directed. Do not cut. 1. Would recommend currently that we going to continue with wound care measures as before and the patient is in agreement with the plan. This includes the use of the [facility] along with a border foam dressing to cover. This is good to be in both regard to the shin as well as the Verhagen, [name] H. ([id number]) heel. 2. I am also can recommend at this time that we have the patient continue with the Tubigrip although its not as good as the compression wrap it is actually not causing any damage to the shin which has been my biggest concern to be honest. We will see patient back for reevaluation  in 1 week here in the clinic. If anything worsens or changes patient will contact our office for additional recommendations. Electronic Signature(s) Signed: 01/02/2021 2:47:57 PM By: Worthy Keeler PA-C Previous Signature: 01/02/2021 2:19:52 PM Version By: Worthy Keeler PA-C Previous Signature: 01/02/2021 2:16:36 PM Version By: Worthy Keeler PA-C Entered By: Worthy Keeler on 01/02/2021 14:47:57 Saetern, [name] -------------------------------------------------------------------------------- SuperBill Details Patient Name: [name] Date of Service: 01/02/2021 Medical Record Number: [id number] Patient Account Number: [id number] Date of Birth/Sex: [birth year] (70 y.o. F) Treating RN: Carlene Coria Primary Care Provider: [name] Other Clinician: Referring Provider: [name] Treating Provider/Extender: [name] in Treatment: 23 Diagnosis Coding ICD-10 Codes Code Description [phone] Pressure ulcer of left heel, stage 3 L97.822 Non-pressure chronic ulcer of other part of left lower leg with fat layer exposed I10 Essential (primary) hypertension N18.30 Chronic kidney disease, stage 3 unspecified J44.9 Chronic obstructive pulmonary disease, unspecified G35 Multiple sclerosis Facility Procedures CPT4 Code: [id number] Description: [id number] - DEB SUBQ TISSUE 20 SQ CM/< Modifier: Quantity: 1 CPT4 Code: Description: ICD-10 Diagnosis Description L89.623 Pressure ulcer of left heel, stage 3 Modifier: Quantity: Physician Procedures CPT4 Code: [id number] Description: 11042 - WC PHYS SUBQ TISS 20 SQ CM Modifier: Quantity: 1 CPT4 Code: Description: ICD-10 Diagnosis Description L89.623 Pressure ulcer of left heel, stage 3 Modifier: Quantity: Electronic Signature(s) Signed: 01/02/2021 2:20:34 PM By: Worthy Keeler PA-C Entered By: Worthy Keeler on 01/02/2021 14:20:33

## 2021-01-09 ENCOUNTER — Other Ambulatory Visit
Admission: RE | Admit: 2021-01-09 | Discharge: 2021-01-09 | Disposition: A | Discharge status: Home / Self Care | Payer: Medicare HMO | Source: Ambulatory Visit | Attending: Physician Assistant | Admitting: Physician Assistant

## 2021-01-09 ENCOUNTER — Encounter: Payer: Medicare HMO | Admitting: Physician Assistant

## 2021-01-09 ENCOUNTER — Other Ambulatory Visit: Payer: Self-pay

## 2021-01-09 DIAGNOSIS — B999 Unspecified infectious disease: Secondary | ICD-10-CM | POA: Diagnosis present

## 2021-01-09 DIAGNOSIS — L89623 Pressure ulcer of left heel, stage 3: Secondary | ICD-10-CM | POA: Diagnosis not present

## 2021-01-09 NOTE — Progress Notes (Addendum)
MYTHILI, KNEISEL ([id number]) Visit Report for 01/09/2021 Chief Complaint Document Details Patient Name: [name], PADOVANO Date of Service: 01/09/2021 2:15 PM Medical Record Number: [id number] Patient Account Number: [id number] Date of Birth/Sex: 1950/05/23 (70 y.o. F) Treating RN: Carlene Coria Primary Care Provider: [name] Other Clinician: Referring Provider: [name] Treating Provider/Extender: [name] in Treatment: 24 Information Obtained from: Patient Chief Complaint Left Heel Pressure Ulcer Electronic Signature(s) Signed: 01/09/2021 2:54:02 PM By: Worthy Keeler PA-C Entered By: Worthy Keeler on 01/09/2021 14:54:02 Aungst, [name] ([id number]) -------------------------------------------------------------------------------- Debridement Details Patient Name: [name] Date of Service: 01/09/2021 2:15 PM Medical Record Number: [id number] Patient Account Number: [id number] Date of Birth/Sex: 12-16-1950 (70 y.o. F) Treating RN: Carlene Coria Primary Care Provider: [name] Other Clinician: Referring Provider: [name] Treating Provider/Extender: [name] in Treatment: 24 Debridement Performed for Wound #13 Left,Midline,Anterior Lower Leg Assessment: Performed By: Physician Tommie Sams., PA-C Debridement Type: Debridement Level of Consciousness (Pre- Awake and Alert procedure): Pre-procedure Verification/Time Out Yes - 15:11 Taken: Start Time: 15:11 Total Area Debrided (L x W): 2 (cm) x 0.4 (cm) = 0.8 (cm) Tissue and other material Viable, Non-Viable, Slough, Subcutaneous, Skin: Dermis , Skin: Epidermis, Slough debrided: Level: Skin/Subcutaneous Tissue Debridement Description: Excisional Instrument: Curette Bleeding: Moderate Hemostasis Achieved: Pressure End Time: 15:20 Procedural Pain: 0 Post Procedural Pain: 0 Response to Treatment: Procedure was tolerated well Level of Consciousness (Post- Awake and  Alert procedure): Post Debridement Measurements of Total Wound Length: (cm) 2 Width: (cm) 0.4 Depth: (cm) 0.1 Volume: (cm) 0.063 Character of Wound/Ulcer Post Debridement: Improved Post Procedure Diagnosis Same as Pre-procedure Electronic Signature(s) Signed: 01/09/2021 9:56:09 PM By: Worthy Keeler PA-C Signed: 01/10/2021 7:54:12 AM By: Carlene Coria RN Entered By: Carlene Coria on 01/09/2021 15:15:31 Vanderheiden, [name] ([id number]) -------------------------------------------------------------------------------- Debridement Details Patient Name: [name] Date of Service: 01/09/2021 2:15 PM Medical Record Number: [id number] Patient Account Number: [id number] Date of Birth/Sex: [birth year] (70 y.o. F) Treating RN: Carlene Coria Primary Care Provider: [name] Other Clinician: Referring Provider: [name] Treating Provider/Extender: [name] in Treatment: 24 Debridement Performed for Wound #12 Left Calcaneus Assessment: Performed By: Physician Tommie Sams., PA-C Debridement Type: Debridement Level of Consciousness (Pre- Awake and Alert procedure): Pre-procedure Verification/Time Out Yes - 15:11 Taken: Start Time: 15:11 Total Area Debrided (L x W): 1.8 (cm) x 1.7 (cm) = 3.06 (cm) Tissue and other material Viable, Non-Viable, Slough, Subcutaneous, Skin: Dermis , Skin: Epidermis, Slough debrided: Level: Skin/Subcutaneous Tissue Debridement Description: Excisional Instrument: Curette Bleeding: Moderate Hemostasis Achieved: Pressure End Time: 15:20 Procedural Pain: 0 Post Procedural Pain: 0 Response to Treatment: Procedure was tolerated well Level of Consciousness (Post- Awake and Alert procedure): Post Debridement Measurements of Total Wound Length: (cm) 1.8 Stage: Category/Stage III Width: (cm) 1.7 Depth: (cm) 0.2 Volume: (cm) 0.481 Character of Wound/Ulcer Post Debridement: Improved Post Procedure Diagnosis Same as  Pre-procedure Electronic Signature(s) Signed: 01/09/2021 9:56:09 PM By: Worthy Keeler PA-C Signed: 01/10/2021 7:54:12 AM By: Carlene Coria RN Entered By: Carlene Coria on 01/09/2021 15:16:00 [name] ([id number]) -------------------------------------------------------------------------------- HPI Details Patient Name: [name] Date of Service: 01/09/2021 2:15 PM Medical Record Number: [id number] Patient Account Number: [id number] Date of Birth/Sex: [birth year] (70 y.o. F) Treating RN: Carlene Coria Primary Care Provider: [name] Other Clinician: Referring Provider: [name] Treating Provider/Extender: [name] in Treatment: 24 History of Present Illness HPI Description: 70 year old with a past medical history significant for MS, urinary incontinence, and  obesity. She has been seen in the wound clinic before for lower extremity ulcerations treated with compression therapy. she is also known to have hypertension, peripheral vascular disease, COPD, obstructive sleep apnea, lumbar radiculopathy, kyphoscoliosis, urinary issues and tobacco abuse. Smokes a packet of cigarettes a day was recently seen at the [facility] for swelling of her legs and feet with a ulceration on the dorsum of the right foot which has been there for about 6 months. she was recently in the ER about a month ago where she was seen for shortness of breath and swelling of the legs and a chest x-ray was within normal limits had an increase in her BNP and was given Lasix and put on doxycycline for a mild cellulitis and possible UTI. Wounds aresmaller today 11/13/15. Debrided and will continue Santyl. 12/21/2015 -- she was admitted to the hospital overnight on 12/18/2015 and diagnosed with urinary retention and cellulitis of the left lower leg. is past to take clindamycin and use Santyl for her wounds. 01/15/2016 -- come back to see [place] for almost a month and continues to be  noncompliant with her dressings 01/30/16 patient presents today for a follow-up visit concerning her ongoing bilateral lower extremity wounds. We have not seen her for the past 2 weeks although we are supposed to be seen her for weekly visits. She currently has a Foley catheter. She tells me that the wounds are intensely painful especially with pressure and palpation at this point in time. Fortunately she is having no interval signs or symptoms of systemic infection but unfortunately the wounds have not improved dramatically since we last saw her. She does have home health coming to take care of her as well. She is currently not in any compression wraps. 02/06/16 ON evaluation today patient continues to experience discomfort regarding her bilateral lower extremity ulcers. She has continued to tolerate the dressing changes at this point in time and continues to have a Foley catheter as well. Fortunately she is back this week in the past she has been somewhat noncompliant with follow-ups I'm glad to see her today. She tells me that her pain level varies but can be as high as a 7 out of 10 with manipulation of the wound. She tells me that she used to be on oxycodone which was managed by the pain clinic although she is no longer on that as she tells me that she was actually smoking marijuana at the time and when this was found out they discontinued her pain medication. She no longer is taking anything pain medication wise and she also does not smoke cigarettes nor marijuana at this point. 02/12/16; this is a patient I don't believe I have previously seen. She has multiple sclerosis. She has 4 punched-out areas on the anterior lateral left leg 2 areas on the right these are all in the same condition. Reasonably small [dime size] wounds each was some depth. Surface of these wounds does not look particularly healthy as there is adherent slough. There is no evidence of surrounding infection or inflammation.  ABIs in this clinic were 0.87 on the right and 0.81 on the left. She is listed as having PAD and is a smoker. Not a diabetic 02/20/16 patient I gave antibiotics to last week for erythema around both wound sites on the left lateral leg and right lateral leg. This appears to be a lot better. One of the areas on the left leg is healed however she still has 3 punched-out open  areas on the left lateral calf and one on the right lateral calf and one on the dorsal foot. She is an ex-smoker quitting 1 month ago. ABIs in this clinic were 0.87 on the right 0.81 on the left 03/12/16; this is a patient I really don't have a good sense of. It would appear for the first 5 or 6 weeks of her stay in this clinic she was cared for by Dr. Con Memos. She appeared on my schedule in mid October and I saw her twice. She has not been here however in 3 weeks. She has advanced Wound Care at home where she lives with her husband. She has multiple sclerosis. I saw her the first time she had 4 punched-out small wounds on the anterior lateral left leg it appears that she is now down to 2. She also had wounds on the lateral and medial aspect of her right leg which were also small and punched out. The only one that remains is on the right lateral. Because of the nature of her wound I went ahead and ordered formal arterial studies this showed an ABI of 1 on the right and one on the left. TBI of 1.4 on the right and 0.79 on the left. She had normal waveforms. She was in the emergency room on 02/28/16; there is a noted edema of her left leg after a fall. She had a duplex ultrasound that showed no evidence for an acute DVT from the left groin to the popliteal fossa. The study was limited in the calf veins due to edema. Also noticeable that she had a left inguinal enlarged lymph node up to 6.2 cm. She also had a left knee x-ray that showed no acute findings and a right foot x-ray that showed soft tissue swelling but no radiographic evidence  of osteomyelitis. The patient does stop smoking 03-19-16 Ms Raysor presents today for evaluation of her bilateral lower extremity ulcerations. She states that she has not smoked in several weeks. She denies any pain or discomfort to bilateral lower extremities, tolerating compression therapy as ordered. 03/26/16; I have not seen this patient in 2 weeks however she has done very well with improvements in the areas in question. After we obtain normal arterial studies, increasing the 4-layer compression really seems to look done a nice job here. She has one open area on the left lateral leg and one on the medial right leg. These have filled in nicely and are now superficial wounds that showed epithelialized. I note the left inguinal lymph node at 6.2 cm. I may need to refer this back to the patient's primary doctor. 04/03/16; patient has 2 remaining wounds 1 on the left lateral leg and one on the right medial leg. Both of filled in nicely since we are able to increase her from a 3 layer to a 4 layer compression. I am also following up on the lymph node notable on her left leg DVT rule out in November 04/10/16; area on the right lateral leg is healed. The area on the left leg is still open but looks considerably better with healthy granulation and less wound area. I12/20/17; last week we healed the patient doubt with regards to the wound on the right leg to her on compression stocking 20-30 mmHg as it turns out I don't think she had a stocking, predictably therefore she has reopened. The left leg as well as the wound on the lateral aspect. Been generally small line 04/24/16; we healed out her right  leg 2 weeks ago to her own 20-30 mm compression stockings although as it turns out she didn't have these and did not purchase some apparently because of financial issues. The left leg wound is on the lateral aspect. Both of these wounds are just about healed. 05/01/16; her wounds on the left leg are healed out  today. The area on the right leg is also healed. Vitamin diligent effort of our staff we have not been able to get any form of compression stocking to this patient. The orders for juxtalite's were given to home health this never materialized. We ordered compression stockings I don't think she was able to afford these. We had a discussion today with both the patient and her husband without these, will accumulate edema and the wounds will simply reopen again. 05/14/16 READMISSION Roell, [place] ([id number]) this is a patient we healed out 2 weeks ago. As bilateral lower extremity venous wounds probably some degree of lymphedema. At the beginning in November I did a duplex ultrasound of her left leg that was negative for DVT but did show a lymph node in the left inguinal area presumably reactive. We've been using compression to her lower extremities eventually healed all her wounds on her bilateral calves but we were not able to get any form of compression stockings for her in spite of intense efforts of our staff. She returns today with reopening on the left leg did not the right she has several superficial areas anteriorly but an actual frank ulcer on the lateral left calf. This is about the size of a dime or smaller. She does not really complain of pain. The patient has a history of PAD however arterial studies we did in November were normal. Her ABIs and Doppler waveforms were both within the normal limits. The patient has a history of MS. In discussing things with her today it would appear that the opening was noted 2 weeks ago according to the patient. We gave her Tubigrip stockings when she left the clinic 05/21/16; she has not yet had the duplex ultrasound of her thigh, this is booked for Thursday. The wound on the left lateral lower leg is improved 05/28/16; her duplex ultrasound of the left leg specifically her left thigh did not show a DVT however did show hypoechogenic enlarged left  inguinal lymph nodes up to 6.9 x 3.7 x 6.8. This also showed in the one in November however maximal diameter was 6.2, at that point I thought these were reactive secondary to cellulitis however there is obviously a differential here. Her wound on the left lower leg is closed. She has a superficial open area on the base of her left fifth metatarsal head on the plantar foot. It looks as though she has some wrap injuries on the anterior leg 06/05/16; since she was last year she had a fall on 1/31. She was seen in the ER but sent back home. I think she is also been seen by her primary physician who picked up on the swelling and the lymphadenopathy. She is ordered a CT scan of the abdomen and pelvis however this will be without contrast because of stage IIIB renal insufficiency. Nevertheless this should be a start the workup to make sure there is not is more systemic problem here. She will probably need consideration of a biopsy of the lymph node in her inguinal area, this would need general surgery. As far as her wounds are concern today the area on the left plantar foot  is healed. She has a small weeping area on the left lower leg. The wrap injury from last week has largely Pardoxically there is a lot less edema in the left thigh. 06/12/16; CT scan is on Friday. She has 2 small open areas one on the lateral left lower leg and one on the anterior lower leg on the left. Both of these looks healthy. I reduced her compression last week to Kerlix and Coban this seems to have not a reasonable job 07/02/16; the patient is now in the skilled facility. I think this was arranged out of the ER when she fell at home although I'm not exactly sure. She is going for I think a laparotomy for the pelvic mass next week. All the wounds in the left leg have healed Readmission: 07/20/2020 upon evaluation today patient presents for reevaluation here in clinic unfortunately she is having issues with a significant stage III pressure  ulcer to her left heel. This is something that occurred when she was in the hospital sick. And subsequently though she does not appear to have any new injury she is still trying to recover from the pressure injury which was quite severe during that time. I do not see any signs of infection right now which is great news. With that being said she has been require some debridement to try to clear this up as much as possible. We need to get the wound VAC cleared so that she can see overall healing and improvement that were looking for. The patient does have a history of hypertension, chronic kidney disease stage III, COPD, and multiple sclerosis. Currently she has been trying to do what she can to keep pressure off of her heel while in bed and in other locations as well. 08/04/20 upon evaluation today patient appears to be doing extremely well currently in regard to her wound on the heel. I do believe the Iodoflex has helped loosen things appear. Unfortunately she tells me that the home health nurse has not been putting the Iodoflex on the wound. That is news to [place] and that they tell me they got supplies in the mail but they did not see the Iodoflex being used. Nonetheless I am wondering if this is just something that they missed or if it truly has not been. I did tell them to look when they get home in their box of supplies to see if they saw the dressing which showed and what it should look like. 08/11/2020 upon evaluation today patient appears to be doing decently well in regard to her wound although unfortunately this is showing signs of some blue-green drainage consistent with Pseudomonas it does have a little worried in this regard. Fortunately there is no evidence of active infection which is great news. No fevers, chills, nausea, vomiting, or diarrhea. 08/18/2020 upon evaluation today patient appears to be doing well with regard to her wound. She has been tolerating the dressing changes without  complication. Fortunately there is no signs of active infection at this time. No fevers, chills, nausea, vomiting, or diarrhea. 4/29; left heel wound pressure ulcer. Slightly smaller. 09/04/2020 upon evaluation today patient appears to be doing well with regard to her wound. This is actually on her heel and seems to be doing well other than the fact that is a little bit moist from excessive drainage. There does not appear to be any signs of active infection at this time. No fevers, chills, nausea, vomiting, or diarrhea.. 09/11/2020 upon evaluation today patient appears  to be doing okay in regard to her wound. With that being said she still has quite a bit of drainage noted at this point. Fortunately there is no signs of active infection which is great news overall I am extremely pleased with where things stand today. I do think we need to try to do something a little bit more to help control the drainage a bit better. 5/23; left posterior heel. This actually looks quite good. Using Hydrofera Blue under compression. Dimensions are smaller 09/26/2020 upon evaluation today patient appears to be doing well with regard to her heel ulcer. In fact this is showing signs of excellent improvement and overall I am extremely pleased with where things stand today. I do not see any evidence of infection which is great news and overall I feel like she is headed in the correct direction. 10/09/2020 upon evaluation today patient appears to be doing excellent in regard to her wound. She has been tolerating the dressing changes without complication. Fortunately there is no evidence of active infection which is also excellent news. No fevers, chills, nausea, vomiting, or diarrhea. 10/16/2020 upon evaluation today patient appears to be doing well with regard to her heel ulcer. Unfortunately she is having issues with an area of deep tissue injury/pressure injury over the shin due to the wrap being on way too tight today.  Fortunately there does not appear to be any signs of infection currently. No fevers, chills, nausea, vomiting, or diarrhea. 10/23/2020 upon evaluation today patient appears to be doing better in regard to her wound. Fortunately there does not appear to be any signs of active infection at this time which is great news. In general I am extremely pleased with where things stand at this point. The patient does not show any signs of worsening which is excellent. She unfortunately still has the area in question with regard to the region over the anterior portion of her shin where she did have some deep tissue injury. Some of this is starting to open up a little bit. With that being said I think the biggest thing we need to do at this point is to try to as much as possible monitor for any additional breakdown and protect the region while it heals up. I think it is actually doing quite well which is great news. Fortunately there does not appear to be any evidence of active infection 11/14/2018 upon evaluation today patient appears to be doing okay in regard to her ischial. Her leg is much more swollen though she has not had a wrap on any type of dressing for some time when home health came out they did not have the Tubigrip. We have not actually seen her since June 27. The patient when I asked her about it seemed to get somewhat upset to be honest. She seems to be having some trouble at home. She states that she had to "raise hell" just to be able to get here to this appointment today. With that being said I really do think she needs to have Hase, Ivylynn H. ([id number]) this wraps and keep the swelling under control and was doing a whole lot better but now seems to be not doing quite as well to be honest. The anterior portion of her shin does seem to be healing and doing better although it did end up opening into the wound. Again that was where home health the wrap does have a little bit too tight causing this  issue previously. 11/27/2020 upon  evaluation today patient appears to be doing poorly in regard to her anterior shin where she did have some area where the wrap actually aggravated the spot. With that being said I think that we do need to make sure to pad this extremely well there is already some signs of healing at this point. I did believe this was probably can open even when I saw it last week and to be honest it has opened. The heel also is showing signs of improvement I do see some slough noted but nothing too significant which is great news. No fevers, chills, nausea, vomiting, or diarrhea. 12/07/2020 upon evaluation today patient appears to be doing decently well in regard to her wounds. Both are showing signs of some slough and biofilm buildup on the surface of the wounds. Fortunately there is no signs of active infection at this time which is great news. No fevers, chills, nausea, vomiting, or diarrhea. 12/15/2020 upon evaluation today patient appears to be doing well with regard to her wounds. She is showing signs of improvement at both locations which is great news. Fortunately I do not see any evidence of active infection at this time which is great as well. 12/25/2020 upon evaluation today patient appears to be doing well with regard to her wound on the posterior heel. This seems to be making some progress which is great news. Unfortunately the biggest issue is that of the shin which again just does not seem to be healing as effectively and quickly as I would like to see. Fortunately there does not appear to be any evidence of active infection which is great news I do believe that we can try to do something a little different using Tubigrip instead of the compression wrapping to see if this will keep pressure off of the shin allow this to heal more effectively and quickly. 01/02/2021 upon evaluation today patient's wound on the shin actually appears to be making excellent progress which is  great news and overall extremely pleased with where things stand today. There is no sign of active infection at this time. No fevers, chills, nausea, vomiting, or diarrhea. Her heel is going require some debridement but overall does not appear to be doing too poorly. 01/09/2021 upon evaluation today patient appears to be doing better in general in regard to her wounds overall. The anterior shin especially seems to be making good progress. I think we are getting close to resolution here. The heel is a little bit more concerning and that it measures a little larger. There is also some erythema surrounding. I think that we probably need to see about doing something to address this I think a culture is probably appropriate as well. Electronic Signature(s) Signed: 01/09/2021 5:27:07 PM By: Worthy Keeler PA-C Entered By: Worthy Keeler on 01/09/2021 17:27:07 [name] ([id number]) -------------------------------------------------------------------------------- Physical Exam Details Patient Name: [name] Date of Service: 01/09/2021 2:15 PM Medical Record Number: [id number] Patient Account Number: [id number] Date of Birth/Sex: 1951-03-01 (70 y.o. F) Treating RN: Carlene Coria Primary Care Provider: [name] Other Clinician: Referring Provider: [name] Treating Provider/Extender: [name] in Treatment: 6 Constitutional Well-nourished and well-hydrated in no acute distress. Respiratory normal breathing without difficulty. Psychiatric this patient is able to make decisions and demonstrates good insight into disease process. Alert and Oriented x 3. pleasant and cooperative. Notes I did perform MolecuLight imaging on both wounds. There was a minimal amount of blush noted initially around the anterior wound at that  I was able to debride away and postdebridement this appeared to be doing much better I was very pleased in that regard. In regard to the heel  there was significant cyan fluorescence noted and subsequently I was able to debride away some of this but nonetheless it was still present and again this was not just flaky tissue or other structure as I did clear away the edges of the wound pretty effectively to rule this out. Electronic Signature(s) Signed: 01/09/2021 5:27:44 PM By: Worthy Keeler PA-C Entered By: Worthy Keeler on 01/09/2021 17:27:44 [name] ([id number]) -------------------------------------------------------------------------------- Physician Orders Details Patient Name: [name] Date of Service: 01/09/2021 2:15 PM Medical Record Number: [id number] Patient Account Number: [id number] Date of Birth/Sex: [birth year] (70 y.o. F) Treating RN: Carlene Coria Primary Care Provider: [name] Other Clinician: Referring Provider: [name] Treating Provider/Extender: [name] in Treatment: 24 Verbal / Phone Orders: No Diagnosis Coding ICD-10 Coding Code Description [phone] Pressure ulcer of left heel, stage 3 I87.2 Venous insufficiency (chronic) (peripheral) L97.822 Non-pressure chronic ulcer of other part of left lower leg with fat layer exposed I10 Essential (primary) hypertension N18.30 Chronic kidney disease, stage 3 unspecified J44.9 Chronic obstructive pulmonary disease, unspecified G35 Multiple sclerosis Follow-up Appointments o Return Appointment in 1 week. [place]: - [facility] [place] for wound care. May utilize formulary equivalent dressing for wound treatment orders unless otherwise specified. Home Health Nurse may visit PRN to address patientos wound care needs. o Scheduled days for dressing changes to be completed; exception, patient has scheduled wound care visit that day. o **Please direct any NON-WOUND related issues/requests for orders to patient's Primary Care Physician. **If current dressing causes regression in  wound condition, may D/C ordered dressing product/s and apply Normal Saline Moist Dressing daily until next [place] or Other MD appointment. **Notify [facility] of regression in wound condition at [phone]. Edema Control - Lymphedema / Segmental Compressive Device / Other Bilateral Lower Extremities o Tubigrip single layer applied. - size D o Elevate, Exercise Daily and Avoid Standing for Long Periods of Time. o Elevate legs to the level of the heart and pump ankles as often as possible - ELEVATE LEGS o Elevate leg(s) parallel to the floor when sitting. Off-Loading Left Lower Extremity o Open toe surgical shoe o Turn and reposition every 2 hours o Other: - Keep pressure off left heel Wound Treatment Wound #12 - Calcaneus Wound Laterality: Left Cleanser: Soap and Water 2 x Per Week/30 Days Discharge Instructions: Gently cleanse wound with antibacterial soap, rinse and pat dry prior to dressing wounds Primary Dressing: Hydrofera Blue Ready Transfer Foam, 4x5 (in/in) (Generic) 2 x Per Week/30 Days Discharge Instructions: Apply to wound bed-cut hydrofera about 2 cm bigger around for absorption. Ensure hydrofera blue touches wound bed Secondary Dressing: Mepilex Border Flex, 4x4 (in/in) 2 x Per Week/30 Days Discharge Instructions: Apply to wound as directed. Do not cut. Wound #13 - Lower Leg Wound Laterality: Left, Midline, Anterior Cleanser: Soap and Water 2 x Per Week/30 Days Discharge Instructions: Gently cleanse wound with antibacterial soap, rinse and pat dry prior to dressing wounds Primary Dressing: Hydrofera Blue Ready Transfer Foam, 4x5 (in/in) (Generic) 2 x Per Week/30 Days Rogan, Garima H. ([id number]) Discharge Instructions: Apply to wound bed-cut hydrofera about 2 cm bigger around for absorption. Ensure hydrofera blue touches wound bed Secondary Dressing: Mepilex Border Flex, 4x4 (in/in) 2 x Per Week/30 Days Discharge Instructions: Apply  to wound as directed. Do not cut. Electronic Signature(s) Signed: 01/09/2021 9:56:09 PM By: Worthy Keeler PA-C Signed: 01/10/2021 7:54:12 AM By: Carlene Coria RN Entered By: Carlene Coria on 01/09/2021 15:10:56 [name] ([id number]) -------------------------------------------------------------------------------- Problem List Details Patient Name: [name] Date of Service: 01/09/2021 2:15 PM Medical Record Number: [id number] Patient Account Number: [id number] Date of Birth/Sex: 1951/01/17 (70 y.o. F) Treating RN: Carlene Coria Primary Care Provider: [name] Other Clinician: Referring Provider: [name] Treating Provider/Extender: [name] in Treatment: 24 Active Problems ICD-10 Encounter Code Description Active Date MDM Diagnosis [phone] Pressure ulcer of left heel, stage 3 07/20/2020 No Yes I87.2 Venous insufficiency (chronic) (peripheral) 01/02/2021 No Yes L97.822 Non-pressure chronic ulcer of other part of left lower leg with fat layer 11/27/2020 No Yes exposed [place] (primary) hypertension 07/20/2020 No Yes N18.30 Chronic kidney disease, stage 3 unspecified 07/20/2020 No Yes J44.9 Chronic obstructive pulmonary disease, unspecified 07/20/2020 No Yes G35 Multiple sclerosis 07/20/2020 No Yes Inactive Problems Resolved Problems Electronic Signature(s) Signed: 01/09/2021 2:53:56 PM By: Worthy Keeler PA-C Entered By: Worthy Keeler on 01/09/2021 14:53:55 Bornemann, [name] ([id number]) -------------------------------------------------------------------------------- Progress Note Details Patient Name: [name] Date of Service: 01/09/2021 2:15 PM Medical Record Number: [id number] Patient Account Number: [id number] Date of Birth/Sex: July 06, 1950 (70 y.o. F) Treating RN: Carlene Coria Primary Care Provider: [name] Other Clinician: Referring Provider: [name] Treating Provider/Extender: [name] in Treatment:  24 Subjective Chief Complaint Information obtained from Patient Left Heel Pressure Ulcer History of Present Illness (HPI) 70 year old with a past medical history significant for MS, urinary incontinence, and obesity. She has been seen in the wound clinic before for lower extremity ulcerations treated with compression therapy. she is also known to have hypertension, peripheral vascular disease, COPD, obstructive sleep apnea, lumbar radiculopathy, kyphoscoliosis, urinary issues and tobacco abuse. Smokes a packet of cigarettes a day was recently seen at the [facility] for swelling of her legs and feet with a ulceration on the dorsum of the right foot which has been there for about 6 months. she was recently in the ER about a month ago where she was seen for shortness of breath and swelling of the legs and a chest x-ray was within normal limits had an increase in her BNP and was given Lasix and put on doxycycline for a mild cellulitis and possible UTI. Wounds aresmaller today 11/13/15. Debrided and will continue Santyl. 12/21/2015 -- she was admitted to the hospital overnight on 12/18/2015 and diagnosed with urinary retention and cellulitis of the left lower leg. is past to take clindamycin and use Santyl for her wounds. 01/15/2016 -- come back to see [place] for almost a month and continues to be noncompliant with her dressings 01/30/16 patient presents today for a follow-up visit concerning her ongoing bilateral lower extremity wounds. We have not seen her for the past 2 weeks although we are supposed to be seen her for weekly visits. She currently has a Foley catheter. She tells me that the wounds are intensely painful especially with pressure and palpation at this point in time. Fortunately she is having no interval signs or symptoms of systemic infection but unfortunately the wounds have not improved dramatically since we last saw her. She does have home health coming to take care of her  as well. She is currently not in any compression wraps. 02/06/16 ON evaluation today patient continues to experience discomfort regarding her bilateral lower extremity ulcers. She has continued to tolerate  the dressing changes at this point in time and continues to have a Foley catheter as well. Fortunately she is back this week in the past she has been somewhat noncompliant with follow-ups I'm glad to see her today. She tells me that her pain level varies but can be as high as a 7 out of 10 with manipulation of the wound. She tells me that she used to be on oxycodone which was managed by the pain clinic although she is no longer on that as she tells me that she was actually smoking marijuana at the time and when this was found out they discontinued her pain medication. She no longer is taking anything pain medication wise and she also does not smoke cigarettes nor marijuana at this point. 02/12/16; this is a patient I don't believe I have previously seen. She has multiple sclerosis. She has 4 punched-out areas on the anterior lateral left leg 2 areas on the right these are all in the same condition. Reasonably small [dime size] wounds each was some depth. Surface of these wounds does not look particularly healthy as there is adherent slough. There is no evidence of surrounding infection or inflammation. ABIs in this clinic were 0.87 on the right and 0.81 on the left. She is listed as having PAD and is a smoker. Not a diabetic 02/20/16 patient I gave antibiotics to last week for erythema around both wound sites on the left lateral leg and right lateral leg. This appears to be a lot better. One of the areas on the left leg is healed however she still has 3 punched-out open areas on the left lateral calf and one on the right lateral calf and one on the dorsal foot. She is an ex-smoker quitting 1 month ago. ABIs in this clinic were 0.87 on the right 0.81 on the left 03/12/16; this is a patient I really  don't have a good sense of. It would appear for the first 5 or 6 weeks of her stay in this clinic she was cared for by Dr. Con Memos. She appeared on my schedule in mid October and I saw her twice. She has not been here however in 3 weeks. She has advanced Wound Care at home where she lives with her husband. She has multiple sclerosis. I saw her the first time she had 4 punched-out small wounds on the anterior lateral left leg it appears that she is now down to 2. She also had wounds on the lateral and medial aspect of her right leg which were also small and punched out. The only one that remains is on the right lateral. Because of the nature of her wound I went ahead and ordered formal arterial studies this showed an ABI of 1 on the right and one on the left. TBI of 1.4 on the right and 0.79 on the left. She had normal waveforms. She was in the emergency room on 02/28/16; there is a noted edema of her left leg after a fall. She had a duplex ultrasound that showed no evidence for an acute DVT from the left groin to the popliteal fossa. The study was limited in the calf veins due to edema. Also noticeable that she had a left inguinal enlarged lymph node up to 6.2 cm. She also had a left knee x-ray that showed no acute findings and a right foot x-ray that showed soft tissue swelling but no radiographic evidence of osteomyelitis. The patient does stop smoking 03-19-16 Ms Coull presents today  for evaluation of her bilateral lower extremity ulcerations. She states that she has not smoked in several weeks. She denies any pain or discomfort to bilateral lower extremities, tolerating compression therapy as ordered. 03/26/16; I have not seen this patient in 2 weeks however she has done very well with improvements in the areas in question. After we obtain normal arterial studies, increasing the 4-layer compression really seems to look done a nice job here. She has one open area on the left lateral leg and one on the  medial right leg. These have filled in nicely and are now superficial wounds that showed epithelialized. I note the left inguinal lymph node at 6.2 cm. I may need to refer this back to the patient's primary doctor. 04/03/16; patient has 2 remaining wounds 1 on the left lateral leg and one on the right medial leg. Both of filled in nicely since we are able to increase her from a 3 layer to a 4 layer compression. I am also following up on the lymph node notable on her left leg DVT rule out in November 04/10/16; area on the right lateral leg is healed. The area on the left leg is still open but looks considerably better with healthy granulation and less wound area. I12/20/17; last week we healed the patient doubt with regards to the wound on the right leg to her on compression stocking 20-30 mmHg as it turns out I don't think she had a stocking, predictably therefore she has reopened. The left leg as well as the wound on the lateral aspect. Been generally small line 04/24/16; we healed out her right leg 2 weeks ago to her own 20-30 mm compression stockings although as it turns out she didn't have these and did not purchase some apparently because of financial issues. The left leg wound is on the lateral aspect. Both of these wounds are just about healed. 05/01/16; her wounds on the left leg are healed out today. The area on the right leg is also healed. Vitamin diligent effort of our staff we have not been able to get any form of compression stocking to this patient. The orders for juxtalite's were given to home health this never materialized. We Golomb, Kailey H. ([id number]) ordered compression stockings I don't think she was able to afford these. We had a discussion today with both the patient and her husband without these, will accumulate edema and the wounds will simply reopen again. 05/14/16 READMISSION this is a patient we healed out 2 weeks ago. As bilateral lower extremity venous wounds probably  some degree of lymphedema. At the beginning in November I did a duplex ultrasound of her left leg that was negative for DVT but did show a lymph node in the left inguinal area presumably reactive. We've been using compression to her lower extremities eventually healed all her wounds on her bilateral calves but we were not able to get any form of compression stockings for her in spite of intense efforts of our staff. She returns today with reopening on the left leg did not the right she has several superficial areas anteriorly but an actual frank ulcer on the lateral left calf. This is about the size of a dime or smaller. She does not really complain of pain. The patient has a history of PAD however arterial studies we did in November were normal. Her ABIs and Doppler waveforms were both within the normal limits. The patient has a history of MS. In discussing things with her today  it would appear that the opening was noted 2 weeks ago according to the patient. We gave her Tubigrip stockings when she left the clinic 05/21/16; she has not yet had the duplex ultrasound of her thigh, this is booked for Thursday. The wound on the left lateral lower leg is improved 05/28/16; her duplex ultrasound of the left leg specifically her left thigh did not show a DVT however did show hypoechogenic enlarged left inguinal lymph nodes up to 6.9 x 3.7 x 6.8. This also showed in the one in November however maximal diameter was 6.2, at that point I thought these were reactive secondary to cellulitis however there is obviously a differential here. Her wound on the left lower leg is closed. She has a superficial open area on the base of her left fifth metatarsal head on the plantar foot. It looks as though she has some wrap injuries on the anterior leg 06/05/16; since she was last year she had a fall on 1/31. She was seen in the ER but sent back home. I think she is also been seen by her primary physician who picked up on the  swelling and the lymphadenopathy. She is ordered a CT scan of the abdomen and pelvis however this will be without contrast because of stage IIIB renal insufficiency. Nevertheless this should be a start the workup to make sure there is not is more systemic problem here. She will probably need consideration of a biopsy of the lymph node in her inguinal area, this would need general surgery. As far as her wounds are concern today the area on the left plantar foot is healed. She has a small weeping area on the left lower leg. The wrap injury from last week has largely Pardoxically there is a lot less edema in the left thigh. 06/12/16; CT scan is on Friday. She has 2 small open areas one on the lateral left lower leg and one on the anterior lower leg on the left. Both of these looks healthy. I reduced her compression last week to Kerlix and Coban this seems to have not a reasonable job 07/02/16; the patient is now in the skilled facility. I think this was arranged out of the ER when she fell at home although I'm not exactly sure. She is going for I think a laparotomy for the pelvic mass next week. All the wounds in the left leg have healed Readmission: 07/20/2020 upon evaluation today patient presents for reevaluation here in clinic unfortunately she is having issues with a significant stage III pressure ulcer to her left heel. This is something that occurred when she was in the hospital sick. And subsequently though she does not appear to have any new injury she is still trying to recover from the pressure injury which was quite severe during that time. I do not see any signs of infection right now which is great news. With that being said she has been require some debridement to try to clear this up as much as possible. We need to get the wound VAC cleared so that she can see overall healing and improvement that were looking for. The patient does have a history of hypertension, chronic kidney disease stage  III, COPD, and multiple sclerosis. Currently she has been trying to do what she can to keep pressure off of her heel while in bed and in other locations as well. 08/04/20 upon evaluation today patient appears to be doing extremely well currently in regard to her wound on the  heel. I do believe the Iodoflex has helped loosen things appear. Unfortunately she tells me that the home health nurse has not been putting the Iodoflex on the wound. That is news to [place] and that they tell me they got supplies in the mail but they did not see the Iodoflex being used. Nonetheless I am wondering if this is just something that they missed or if it truly has not been. I did tell them to look when they get home in their box of supplies to see if they saw the dressing which showed and what it should look like. 08/11/2020 upon evaluation today patient appears to be doing decently well in regard to her wound although unfortunately this is showing signs of some blue-green drainage consistent with Pseudomonas it does have a little worried in this regard. Fortunately there is no evidence of active infection which is great news. No fevers, chills, nausea, vomiting, or diarrhea. 08/18/2020 upon evaluation today patient appears to be doing well with regard to her wound. She has been tolerating the dressing changes without complication. Fortunately there is no signs of active infection at this time. No fevers, chills, nausea, vomiting, or diarrhea. 4/29; left heel wound pressure ulcer. Slightly smaller. 09/04/2020 upon evaluation today patient appears to be doing well with regard to her wound. This is actually on her heel and seems to be doing well other than the fact that is a little bit moist from excessive drainage. There does not appear to be any signs of active infection at this time. No fevers, chills, nausea, vomiting, or diarrhea.. 09/11/2020 upon evaluation today patient appears to be doing okay in regard to her wound. With  that being said she still has quite a bit of drainage noted at this point. Fortunately there is no signs of active infection which is great news overall I am extremely pleased with where things stand today. I do think we need to try to do something a little bit more to help control the drainage a bit better. 5/23; left posterior heel. This actually looks quite good. Using Hydrofera Blue under compression. Dimensions are smaller 09/26/2020 upon evaluation today patient appears to be doing well with regard to her heel ulcer. In fact this is showing signs of excellent improvement and overall I am extremely pleased with where things stand today. I do not see any evidence of infection which is great news and overall I feel like she is headed in the correct direction. 10/09/2020 upon evaluation today patient appears to be doing excellent in regard to her wound. She has been tolerating the dressing changes without complication. Fortunately there is no evidence of active infection which is also excellent news. No fevers, chills, nausea, vomiting, or diarrhea. 10/16/2020 upon evaluation today patient appears to be doing well with regard to her heel ulcer. Unfortunately she is having issues with an area of deep tissue injury/pressure injury over the shin due to the wrap being on way too tight today. Fortunately there does not appear to be any signs of infection currently. No fevers, chills, nausea, vomiting, or diarrhea. 10/23/2020 upon evaluation today patient appears to be doing better in regard to her wound. Fortunately there does not appear to be any signs of active infection at this time which is great news. In general I am extremely pleased with where things stand at this point. The patient does not show any signs of worsening which is excellent. She unfortunately still has the area in question with regard to the  region over the anterior portion of her shin where she did have some deep tissue injury. Some of  this is starting to open up a little bit. With that being said I think the biggest thing we need to do at this point is to try to as much as possible monitor for any additional breakdown and protect the region while it heals up. I think it is actually doing quite well which is great news. Fortunately there does not appear to be any evidence of active infection YUNIQUE, COULSON. ([id number]) 11/14/2018 upon evaluation today patient appears to be doing okay in regard to her ischial. Her leg is much more swollen though she has not had a wrap on any type of dressing for some time when home health came out they did not have the Tubigrip. We have not actually seen her since June 27. The patient when I asked her about it seemed to get somewhat upset to be honest. She seems to be having some trouble at home. She states that she had to "raise hell" just to be able to get here to this appointment today. With that being said I really do think she needs to have this wraps and keep the swelling under control and was doing a whole lot better but now seems to be not doing quite as well to be honest. The anterior portion of her shin does seem to be healing and doing better although it did end up opening into the wound. Again that was where home health the wrap does have a little bit too tight causing this issue previously. 11/27/2020 upon evaluation today patient appears to be doing poorly in regard to her anterior shin where she did have some area where the wrap actually aggravated the spot. With that being said I think that we do need to make sure to pad this extremely well there is already some signs of healing at this point. I did believe this was probably can open even when I saw it last week and to be honest it has opened. The heel also is showing signs of improvement I do see some slough noted but nothing too significant which is great news. No fevers, chills, nausea, vomiting, or diarrhea. 12/07/2020 upon  evaluation today patient appears to be doing decently well in regard to her wounds. Both are showing signs of some slough and biofilm buildup on the surface of the wounds. Fortunately there is no signs of active infection at this time which is great news. No fevers, chills, nausea, vomiting, or diarrhea. 12/15/2020 upon evaluation today patient appears to be doing well with regard to her wounds. She is showing signs of improvement at both locations which is great news. Fortunately I do not see any evidence of active infection at this time which is great as well. 12/25/2020 upon evaluation today patient appears to be doing well with regard to her wound on the posterior heel. This seems to be making some progress which is great news. Unfortunately the biggest issue is that of the shin which again just does not seem to be healing as effectively and quickly as I would like to see. Fortunately there does not appear to be any evidence of active infection which is great news I do believe that we can try to do something a little different using Tubigrip instead of the compression wrapping to see if this will keep pressure off of the shin allow this to heal more effectively and quickly. 01/02/2021 upon evaluation  today patient's wound on the shin actually appears to be making excellent progress which is great news and overall extremely pleased with where things stand today. There is no sign of active infection at this time. No fevers, chills, nausea, vomiting, or diarrhea. Her heel is going require some debridement but overall does not appear to be doing too poorly. 01/09/2021 upon evaluation today patient appears to be doing better in general in regard to her wounds overall. The anterior shin especially seems to be making good progress. I think we are getting close to resolution here. The heel is a little bit more concerning and that it measures a little larger. There is also some erythema surrounding. I think  that we probably need to see about doing something to address this I think a culture is probably appropriate as well. Objective Constitutional Well-nourished and well-hydrated in no acute distress. Vitals Time Taken: 2:53 PM, Height: 64 in, Weight: 265 lbs, BMI: 45.5, Temperature: 98.5 F, Pulse: 80 bpm, Respiratory Rate: 18 breaths/min, Blood Pressure: 142/84 mmHg. Respiratory normal breathing without difficulty. Psychiatric this patient is able to make decisions and demonstrates good insight into disease process. Alert and Oriented x 3. pleasant and cooperative. General Notes: I did perform MolecuLight imaging on both wounds. There was a minimal amount of blush noted initially around the anterior wound at that I was able to debride away and postdebridement this appeared to be doing much better I was very pleased in that regard. In regard to the heel there was significant cyan fluorescence noted and subsequently I was able to debride away some of this but nonetheless it was still present and again this was not just flaky tissue or other structure as I did clear away the edges of the wound pretty effectively to rule this out. Integumentary (Hair, Skin) Wound #12 status is Open. Original cause of wound was Pressure Injury. The date acquired was: 06/27/2020. The wound has been in treatment 24 weeks. The wound is located on the Left Calcaneus. The wound measures 1.8cm length x 1.7cm width x 0.2cm depth; 2.403cm^2 area and 0.481cm^3 volume. There is Fat Layer (Subcutaneous Tissue) exposed. There is no tunneling or undermining noted. There is a medium amount of serous drainage noted. The wound margin is thickened. There is large (67-100%) red, pink granulation within the wound bed. There is a small (1-33%) amount of necrotic tissue within the wound bed including Adherent Slough. Wound #13 status is Open. Original cause of wound was Shear/Friction. The date acquired was: 11/24/2020. The wound has been  in treatment 6 weeks. The wound is located on the Left,Midline,Anterior Lower Leg. The wound measures 2cm length x 0.4cm width x 0.1cm depth; 0.628cm^2 area and 0.063cm^3 volume. There is Fat Layer (Subcutaneous Tissue) exposed. There is no tunneling or undermining noted. There is a medium amount of serosanguineous drainage noted. The wound margin is indistinct and nonvisible. There is large (67-100%) red granulation within the [place], Electra H. ([id number]) wound bed. There is a small (1-33%) amount of necrotic tissue within the wound bed including Adherent Slough. Assessment Active Problems ICD-10 Pressure ulcer of left heel, stage 3 Venous insufficiency (chronic) (peripheral) Non-pressure chronic ulcer of other part of left lower leg with fat layer exposed Essential (primary) hypertension Chronic kidney disease, stage 3 unspecified Chronic obstructive pulmonary disease, unspecified Multiple sclerosis Procedures Wound #12 Pre-procedure diagnosis of Wound #12 is a Pressure Ulcer located on the Left Calcaneus . There was a Excisional Skin/Subcutaneous Tissue Debridement with a total area of  3.06 sq cm performed by Tommie Sams., PA-C. With the following instrument(s): Curette to remove Viable and Non-Viable tissue/material. Material removed includes Subcutaneous Tissue, Slough, Skin: Dermis, and Skin: Epidermis. No specimens were taken. A time out was conducted at 15:11, prior to the start of the procedure. A Moderate amount of bleeding was controlled with Pressure. The procedure was tolerated well with a pain level of 0 throughout and a pain level of 0 following the procedure. Post Debridement Measurements: 1.8cm length x 1.7cm width x 0.2cm depth; 0.481cm^3 volume. Post debridement Stage noted as Category/Stage III. Character of Wound/Ulcer Post Debridement is improved. Post procedure Diagnosis Wound #12: Same as Pre-Procedure Wound #13 Pre-procedure diagnosis of Wound #13 is a Trauma,  Other located on the Left,Midline,Anterior Lower Leg . There was a Excisional Skin/Subcutaneous Tissue Debridement with a total area of 0.8 sq cm performed by Tommie Sams., PA-C. With the following instrument(s): Curette to remove Viable and Non-Viable tissue/material. Material removed includes Subcutaneous Tissue, Slough, Skin: Dermis, and Skin: Epidermis. No specimens were taken. A time out was conducted at 15:11, prior to the start of the procedure. A Moderate amount of bleeding was controlled with Pressure. The procedure was tolerated well with a pain level of 0 throughout and a pain level of 0 following the procedure. Post Debridement Measurements: 2cm length x 0.4cm width x 0.1cm depth; 0.063cm^3 volume. Character of Wound/Ulcer Post Debridement is improved. Post procedure Diagnosis Wound #13: Same as Pre-Procedure Plan Follow-up Appointments: Return Appointment in 1 week. Home Health: [facility] for wound care. May utilize formulary equivalent dressing for wound treatment orders unless otherwise specified. Home Health Nurse may visit [facility] to address patient s wound care needs. Scheduled days for dressing changes to be completed; exception, patient has scheduled wound care visit that day. **Please direct any NON-WOUND related issues/requests for orders to patient's Primary Care Physician. **If current dressing causes regression in wound condition, may D/C ordered dressing product/s and apply Normal Saline Moist Dressing daily until next [facility] or Other MD appointment. **Notify [facility] of regression in wound condition at [phone]. Edema Control - Lymphedema / Segmental Compressive Device / Other: Tubigrip single layer applied. - size D Elevate, Exercise Daily and Avoid Standing for Long Periods of Time. Elevate legs to the level of the heart and pump ankles as often as possible - ELEVATE LEGS Elevate leg(s)  parallel to the floor when sitting. Off-Loading: Open toe surgical shoe Turn and reposition every 2 hours Other: - Keep pressure off left heel WOUND #12: - Calcaneus Wound Laterality: Left Cleanser: Soap and Water 2 x Per Week/30 Days Discharge Instructions: Gently cleanse wound with antibacterial soap, rinse and pat dry prior to dressing wounds Feeley, Brody H. ([id number]) Primary Dressing: Hydrofera Blue Ready Transfer Foam, 4x5 (in/in) (Generic) 2 x Per Week/30 Days Discharge Instructions: Apply to wound bed-cut hydrofera about 2 cm bigger around for absorption. Ensure hydrofera blue touches wound bed Secondary Dressing: Mepilex Border Flex, 4x4 (in/in) 2 x Per Week/30 Days Discharge Instructions: Apply to wound as directed. Do not cut. WOUND #13: - Lower Leg Wound Laterality: Left, Midline, Anterior Cleanser: Soap and Water 2 x Per Week/30 Days Discharge Instructions: Gently cleanse wound with antibacterial soap, rinse and pat dry prior to dressing wounds Primary Dressing: Hydrofera Blue Ready Transfer Foam, 4x5 (in/in) (Generic) 2 x Per Week/30 Days Discharge Instructions: Apply to wound bed-cut hydrofera about 2 cm bigger around for absorption. Ensure  hydrofera blue touches wound bed Secondary Dressing: Mepilex Border Flex, 4x4 (in/in) 2 x Per Week/30 Days Discharge Instructions: Apply to wound as directed. Do not cut. 1. Would recommend currently that we go ahead and continue with the Hydrofera Blue dressings I think this is a good antimicrobial dressing and seems to be doing decently well. That being said I am going to also go ahead and obtain a wound culture which was done today from the heel in order to evaluate for evidence of infection. 2. I am also can recommend that we have the patient continue with the border foam dressing to cover both locations. 3. I am also can suggest continuation of the Tubigrip to help with edema control I think that is doing a great job. We will see  patient back for reevaluation in 1 week here in the clinic. If anything worsens or changes patient will contact our office for additional recommendations. MolecuLight Medical Necessity Wound shows signs of deterioration such as increased drainage/slough/odor or an increase in size, Initial Evaluation of the Fluorescence bacterial imaging was medically necessary today due to: wound with MolecuLightDX to determine baseline bacterial bioburden level Wound(s) Scanned The following wound(s) were scanned with MolecuLight DX Anterior left shin and left heel. MolecuLight Procedure The MolecularLight DX device was cleaned with a disinfectant wipe prior to use., The correct patient profile was confirmed and correct wound was verified., Range finder sensor used to ensure appropriate distance The following was completed: selected between imaging unit and wound bed, Room lights were turned off and the ambient light sensor was checked., Blue circle appeared around the lightbulb., The fluorescence icon was selected. Screen was tapped to enhance focus and the image was captured. Additional drapes were used to ensure adequate darknesso No MolecuLight Results The results of todayos scan revealed: Red Colors, Cyan Colors, [place] The indicated colors were noted in the following areas. In the periphery of the wound As a result of todayos scan, the following treatment plans were put in Culture of heel obtained continue antimicrobial Hydrofera Blue place. Potential ICD-10 Codes B96.5 pseudomonas (Cyan Color), 49.9 Bacterial infection unspecified ICD-10 (Red Color) Electronic Signature(s) Signed: 01/09/2021 5:29:37 PM By: Worthy Keeler PA-C Entered By: Worthy Keeler on 01/09/2021 17:29:36 Ocampo, [name] ([id number]) -------------------------------------------------------------------------------- SuperBill Details Patient Name: [name] Date of Service: 01/09/2021 Medical Record Number:  [id number] Patient Account Number: [id number] Date of Birth/Sex: 1951/01/23 (70 y.o. F) Treating RN: Carlene Coria Primary Care Provider: [name] Other Clinician: Referring Provider: [name] Treating Provider/Extender: [name] in Treatment: 24 Diagnosis Coding ICD-10 Codes Code Description [phone] Pressure ulcer of left heel, stage 3 I87.2 Venous insufficiency (chronic) (peripheral) L97.822 Non-pressure chronic ulcer of other part of left lower leg with fat layer exposed I10 Essential (primary) hypertension N18.30 Chronic kidney disease, stage 3 unspecified J44.9 Chronic obstructive pulmonary disease, unspecified G35 Multiple sclerosis Facility Procedures CPT4 Code: [id number] Description: 11042 - DEB SUBQ TISSUE 20 SQ CM/< Modifier: Quantity: 1 CPT4 Code: Description: ICD-10 Diagnosis Description L89.623 Pressure ulcer of left heel, stage 3 L97.822 Non-pressure chronic ulcer of other part of left lower leg with fat layer Modifier: exposed Quantity: CPT4 Code: [id number] Description: NONCNTACT RT FLORO WND 1ST STE Modifier: Quantity: 1 CPT4 Code: Description: ICD-10 Diagnosis Description L89.623 Pressure ulcer of left heel, stage 3 Modifier: Quantity: CPT4 Code: [id number] Description: NONCNTACT RT FLORO WND EA ADDL Modifier: Quantity: 1 CPT4 Code: Description: ICD-10 Diagnosis Description L97.822 Non-pressure chronic ulcer of other part of left  lower leg with fat layer Modifier: exposed Quantity: Physician Procedures CPT4 Code: [id number] Description: 99214 - WC PHYS LEVEL 4 - EST PT Modifier: 25 Quantity: 1 CPT4 Code: Description: ICD-10 Diagnosis Description L89.623 Pressure ulcer of left heel, stage 3 I87.2 Venous insufficiency (chronic) (peripheral) L97.822 Non-pressure chronic ulcer of other part of left lower leg with fat layer I10 Essential (primary)  hypertension Modifier: exposed Quantity: CPT4 [id number] Description: 11042 - WC PHYS SUBQ  TISS 20 SQ CM Modifier: Quantity: 1 CPT4 Code: Description: ICD-10 Diagnosis Description L89.623 Pressure ulcer of left heel, stage 3 L97.822 Non-pressure chronic ulcer of other part of left lower leg with fat layer Modifier: exposed Quantity: CPT4 Code: [id number] Description: HC MOLECULIGHT WND IMG 1ST ANATOMIC SITE Modifier: Quantity: 1 CPT4 Code: Description: ICD-10 Diagnosis Description L89.623 Pressure ulcer of left heel, stage 3 Modifier: Quantity: CPT4 Code: [id number] Description: HC MOLECULIGHT WND IMG ADDL ANATOMIC SITE Modifier: Quantity: 1 CPT4 Code: [place], [place]ia H Description: ICD-10 Diagnosis Description L97.822 Non-pressure chronic ulcer of other part of left lower leg with fat layer . ([id number]) Modifier: exposed Quantity: Electronic Signature(s) Signed: 01/09/2021 5:33:58 PM By: Worthy Keeler PA-C Previous Signature: 01/09/2021 5:32:34 PM Version By: Worthy Keeler PA-C Entered By: Worthy Keeler on 01/09/2021 17:33:57

## 2021-01-10 NOTE — Progress Notes (Signed)
MUNA, DEMERS ([id number]) Visit Report for 01/02/2021 Arrival Information Details Patient Name: [name], [name] Date of Service: 01/02/2021 1:15 PM Medical Record Number: [id number] Patient Account Number: [id number] Date of Birth/Sex: 09/12/1950 (70 y.o. F) Treating RN: Carlene Coria Primary Care Jaretzi Droz: [name] Other Clinician: Referring Karsin Pesta: [name] Treating Kevaughn Ewing/Extender: [name] in Treatment: 23 Visit Information History Since Last Visit All ordered tests and consults were completed: No Patient Arrived: Wheel Chair Added or deleted any medications: No Arrival Time: 13:28 Any new allergies or adverse reactions: No Accompanied By: husband Had a fall or experienced change in No Transfer Assistance: None activities of daily living that may affect Patient Identification Verified: Yes risk of falls: Secondary Verification Process Completed: Yes Signs or symptoms of abuse/neglect since last visito No Patient Requires Transmission-Based Precautions: No Hospitalized since last visit: No Patient Has Alerts: Yes Implantable device outside of the clinic excluding No cellular tissue based products placed in the center since last visit: Has Dressing in Place as Prescribed: Yes Has Compression in Place as Prescribed: Yes Pain Present Now: No Electronic Signature(s) Signed: 01/10/2021 7:54:52 AM By: Carlene Coria RN Entered By: Carlene Coria on 01/02/2021 13:36:05 [name], [name] ([id number]) -------------------------------------------------------------------------------- Clinic Level of Care Assessment Details Patient Name: [name] Date of Service: 01/02/2021 1:15 PM Medical Record Number: [id number] Patient Account Number: [id number] Date of Birth/Sex: 1951-03-18 (70 y.o. F) Treating RN: Carlene Coria Primary Care Monette Omara: [name] Other Clinician: Referring Brae Schaafsma: [name] Treating [name] Pigman/Extender: [name]  in Treatment: 23 Clinic Level of Care Assessment Items TOOL 1 Quantity Score '[]'  - Use when EandM and Procedure is performed on INITIAL visit 0 ASSESSMENTS - Nursing Assessment / Reassessment '[]'  - General Physical Exam (combine w/ comprehensive assessment (listed just below) when performed on new 0 pt. evals) '[]'  - 0 Comprehensive Assessment (HX, ROS, Risk Assessments, Wounds Hx, etc.) ASSESSMENTS - Wound and Skin Assessment / Reassessment '[]'  - Dermatologic / Skin Assessment (not related to wound area) 0 ASSESSMENTS - Ostomy and/or Continence Assessment and Care '[]'  - Incontinence Assessment and Management 0 '[]'  - 0 Ostomy Care Assessment and Management (repouching, etc.) PROCESS - Coordination of Care '[]'  - Simple Patient / Family Education for ongoing care 0 '[]'  - 0 Complex (extensive) Patient / Family Education for ongoing care '[]'  - 0 Staff obtains Programmer, systems, Records, Test Results / Process Orders '[]'  - 0 Staff telephones HHA, Nursing Homes / Clarify orders / etc '[]'  - 0 Routine Transfer to another Facility (non-emergent condition) '[]'  - 0 Routine Hospital Admission (non-emergent condition) '[]'  - 0 New Admissions / Biomedical engineer / Ordering NPWT, Apligraf, etc. '[]'  - 0 Emergency Hospital Admission (emergent condition) PROCESS - Special Needs '[]'  - Pediatric / Minor Patient Management 0 '[]'  - 0 Isolation Patient Management '[]'  - 0 Hearing / Language / Visual special needs '[]'  - 0 Assessment of Community assistance (transportation, D/C planning, etc.) '[]'  - 0 Additional assistance / Altered mentation '[]'  - 0 Support Surface(s) Assessment (bed, cushion, seat, etc.) INTERVENTIONS - Miscellaneous '[]'  - External ear exam 0 '[]'  - 0 Patient Transfer (multiple staff / Civil Service fast streamer / Similar devices) '[]'  - 0 Simple Staple / Suture removal (25 or less) '[]'  - 0 Complex Staple / Suture removal (26 or more) '[]'  - 0 Hypo/Hyperglycemic Management (do not check if billed separately) '[]'  -  0 Ankle / Brachial Index (ABI) - do not check if billed separately Has the patient been seen at the hospital within the  last three years: Yes Total Score: 0 Level Of Care: ____ [name] ([id number]) Electronic Signature(s) Signed: 01/10/2021 7:54:52 AM By: Carlene Coria RN Entered By: Carlene Coria on 01/02/2021 14:19:13 [name] ([id number]) -------------------------------------------------------------------------------- Encounter Discharge Information Details Patient Name: [name] Date of Service: 01/02/2021 1:15 PM Medical Record Number: [id number] Patient Account Number: [id number] Date of Birth/Sex: 10-10-1950 (70 y.o. F) Treating RN: Carlene Coria Primary Care Eyonna Sandstrom: [name] Other Clinician: Referring Randy Whitener: [name] Treating Banjamin Stovall/Extender: [name] in Treatment: 23 Encounter Discharge Information Items Discharge Condition: Stable Ambulatory Status: Wheelchair Discharge Destination: Home Transportation: Private Auto Accompanied By: husband Schedule Follow-up Appointment: Yes Clinical Summary of Care: Patient Declined Electronic Signature(s) Signed: 01/10/2021 7:54:52 AM By: Carlene Coria RN Entered By: Carlene Coria on 01/02/2021 14:00:12 [name] ([id number]) -------------------------------------------------------------------------------- Lower Extremity Assessment Details Patient Name: [name] Date of Service: 01/02/2021 1:15 PM Medical Record Number: [id number] Patient Account Number: [id number] Date of Birth/Sex: [birth year] (70 y.o. F) Treating RN: Carlene Coria Primary Care Cloteal Isaacson: [name] Other Clinician: Referring Sahith Nurse: [name] Treating Daysen Gundrum/Extender: [name] in Treatment: 23 Edema Assessment Assessed: [Left: No] [Right: No] Edema: [Left: Ye] [Right: s] Calf Left: Right: Point of Measurement: 29 cm From Medial Instep 39 cm Ankle Left: Right: Point of  Measurement: 10 cm From Medial Instep 20 cm Vascular Assessment Pulses: Dorsalis Pedis Palpable: [Left:Yes] Electronic Signature(s) Signed: 01/10/2021 7:54:52 AM By: Carlene Coria RN Entered By: Carlene Coria on 01/02/2021 13:49:12 Dumire, [name] ([id number]) -------------------------------------------------------------------------------- Multi Wound Chart Details Patient Name: [name] Date of Service: 01/02/2021 1:15 PM Medical Record Number: [id number] Patient Account Number: [id number] Date of Birth/Sex: [birth year] (70 y.o. F) Treating RN: Carlene Coria Primary Care Jash Wahlen: [name] Other Clinician: Referring Kinley Dozier: [name] Treating Alizae Bechtel/Extender: [name] in Treatment: 23 Vital Signs Height(in): 42 Pulse(bpm): 92 Weight(lbs): 22 Blood Pressure(mmHg): 131/69 Body Mass Index(BMI): 45 Temperature(F): 98.6 Respiratory Rate(breaths/min): 18 Photos: [N/A:N/A] Wound Location: Left Calcaneus Left, Midline, Anterior Lower Leg N/A Wounding Event: Pressure Injury Shear/Friction N/A Primary Etiology: Pressure Ulcer Trauma, Other N/A Comorbid History: Cataracts, Chronic Obstructive Cataracts, Chronic Obstructive N/A Pulmonary Disease (COPD), Sleep Pulmonary Disease (COPD), Sleep Apnea, Hypertension, Apnea, Hypertension, Osteoarthritis, Neuropathy Osteoarthritis, Neuropathy Date Acquired: 06/27/2020 11/24/2020 N/A Weeks of Treatment: 23 5 N/A Wound Status: Open Open N/A Measurements L x W x D (cm) 1.5x1.5x0.2 2.7x0.6x0.1 N/A Area (cm) : 1.767 1.272 N/A Volume (cm) : 0.353 0.127 N/A % Reduction in Area: 65.40% 68.90% N/A % Reduction in Volume: 65.40% 68.90% N/A Classification: Category/Stage III Partial Thickness N/A Exudate Amount: Medium Medium N/A Exudate Type: Serous Serosanguineous N/A Exudate Color: amber red, brown N/A Wound Margin: Thickened Indistinct, nonvisible N/A Granulation Amount: Large (67-100%) Large (67-100%)  N/A Granulation Quality: Red, Pink Red N/A Necrotic Amount: Small (1-33%) Small (1-33%) N/A Exposed Structures: Fat Layer (Subcutaneous Tissue): Fat Layer (Subcutaneous Tissue): N/A Yes Yes Fascia: No Fascia: No Tendon: No Tendon: No Muscle: No Muscle: No Joint: No Joint: No Bone: No Bone: No Epithelialization: Small (1-33%) Medium (34-66%) N/A Treatment Notes Electronic Signature(s) Signed: 01/10/2021 7:54:52 AM By: Carlene Coria RN Entered By: Carlene Coria on 01/02/2021 13:55:19 [name] ([id number]) -------------------------------------------------------------------------------- Multi-Disciplinary Care Plan Details Patient Name: [name] Date of Service: 01/02/2021 1:15 PM Medical Record Number: [id number] Patient Account Number: [id number] Date of Birth/Sex: Nov 03, 1950 (70 y.o. F) Treating RN: Carlene Coria Primary Care Darely Becknell: [name] Other Clinician: Referring Nakyiah Kuck: [name] Treating Larin Depaoli/Extender: [name] in  Treatment: 23 Active Inactive Wound/Skin Impairment Nursing Diagnoses: Impaired tissue integrity Goals: Patient/caregiver will verbalize understanding of skin care regimen Date Initiated: 07/20/2020 Date Inactivated: 09/04/2020 Target Resolution Date: 09/19/2020 Goal Status: Met Ulcer/skin breakdown will have a volume reduction of 30% by week 4 Date Initiated: 07/20/2020 Date Inactivated: 08/25/2020 Target Resolution Date: 08/20/2020 Goal Status: Met Ulcer/skin breakdown will have a volume reduction of 50% by week 8 Date Initiated: 07/20/2020 Date Inactivated: 10/09/2020 Target Resolution Date: 09/19/2020 Goal Status: Met Ulcer/skin breakdown will have a volume reduction of 80% by week 12 Date Initiated: 07/20/2020 Target Resolution Date: 10/20/2020 Goal Status: Active Ulcer/skin breakdown will heal within 14 weeks Date Initiated: 07/20/2020 Target Resolution Date: 11/19/2020 Goal Status:  Active Interventions: Assess patient/caregiver ability to obtain necessary supplies Assess patient/caregiver ability to perform ulcer/skin care regimen upon admission and as needed Provide education on ulcer and skin care Treatment Activities: Referred to DME Juliett Eastburn for dressing supplies : 07/20/2020 Skin care regimen initiated : 07/20/2020 Notes: Electronic Signature(s) Signed: 01/10/2021 7:54:52 AM By: Carlene Coria RN Entered By: Carlene Coria on 01/02/2021 13:55:04 [name] ([id number]) -------------------------------------------------------------------------------- Pain Assessment Details Patient Name: [name] Date of Service: 01/02/2021 1:15 PM Medical Record Number: [id number] Patient Account Number: [id number] Date of Birth/Sex: December 20, 1950 (70 y.o. F) Treating RN: Carlene Coria Primary Care Cherri Yera: [name] Other Clinician: Referring Launi Asencio: [name] Treating Shonice Wrisley/Extender: [name] in Treatment: 23 Active Problems Location of Pain Severity and Description of Pain Patient Has Paino No Site Locations Pain Management and Medication Current Pain Management: Electronic Signature(s) Signed: 01/10/2021 7:54:52 AM By: Carlene Coria RN Entered By: Carlene Coria on 01/02/2021 13:46:23 [name] ([id number]) -------------------------------------------------------------------------------- Patient/Caregiver Education Details Patient Name: [name] Date of Service: 01/02/2021 1:15 PM Medical Record Number: [id number] Patient Account Number: [id number] Date of Birth/Gender: [birth year] (70 y.o. F) Treating RN: Carlene Coria Primary Care Physician: [name] Other Clinician: Referring Physician: [name] Treating Physician/Extender: [name] in Treatment: 23 Education Assessment Education Provided To: Patient Education Topics Provided Wound/Skin Impairment: Methods: Explain/Verbal Responses: State  content correctly Electronic Signature(s) Signed: 01/10/2021 7:54:52 AM By: Carlene Coria RN Entered By: Carlene Coria on 01/02/2021 14:19:45 [name], [name] ([id number]) -------------------------------------------------------------------------------- Wound Assessment Details Patient Name: [name] Date of Service: 01/02/2021 1:15 PM Medical Record Number: [id number] Patient Account Number: [id number] Date of Birth/Sex: [birth year] (70 y.o. F) Treating RN: Carlene Coria Primary Care Myreon Wimer: [name] Other Clinician: Referring Salem Lembke: [name] Treating Serayah Yazdani/Extender: [name] in Treatment: 23 Wound Status Wound Number: 12 Primary Pressure Ulcer Etiology: Wound Location: Left Calcaneus Wound Open Wounding Event: Pressure Injury Status: Date Acquired: 06/27/2020 Comorbid Cataracts, Chronic Obstructive Pulmonary Disease Weeks Of Treatment: 23 History: (COPD), Sleep Apnea, Hypertension, Osteoarthritis, Clustered Wound: No Neuropathy Photos Wound Measurements Length: (cm) 1.5 Width: (cm) 1.5 Depth: (cm) 0.2 Area: (cm) 1.767 Volume: (cm) 0.353 % Reduction in Area: 65.4% % Reduction in Volume: 65.4% Epithelialization: Small (1-33%) Tunneling: No Undermining: No Wound Description Classification: Category/Stage III Wound Margin: Thickened Exudate Amount: Medium Exudate Type: Serous Exudate Color: amber Foul Odor After Cleansing: No Slough/Fibrino Yes Wound Bed Granulation Amount: Large (67-100%) Exposed Structure Granulation Quality: Red, Pink Fascia Exposed: No Necrotic Amount: Small (1-33%) Fat Layer (Subcutaneous Tissue) Exposed: Yes Necrotic Quality: Adherent Slough Tendon Exposed: No Muscle Exposed: No Joint Exposed: No Bone Exposed: No Electronic Signature(s) Signed: 01/10/2021 7:54:52 AM By: Carlene Coria RN Entered By: Carlene Coria on 01/02/2021 13:47:43 [name], [name]  ([id number]) -------------------------------------------------------------------------------- Wound Assessment  Details Patient Name: [name], [name] Date of Service: 01/02/2021 1:15 PM Medical Record Number: [id number] Patient Account Number: [id number] Date of Birth/Sex: 12-27-1950 (70 y.o. F) Treating RN: Carlene Coria Primary Care Linkyn Gobin: [name] Other Clinician: Referring Vernesha Talbot: [name] Treating Nazaria Riesen/Extender: [name] in Treatment: 23 Wound Status Wound Number: 13 Primary Trauma, Other Etiology: Wound Location: Left, Midline, Anterior Lower Leg Wound Open Wounding Event: Shear/Friction Status: Date Acquired: 11/24/2020 Comorbid Cataracts, Chronic Obstructive Pulmonary Disease Weeks Of Treatment: 5 History: (COPD), Sleep Apnea, Hypertension, Osteoarthritis, Clustered Wound: No Neuropathy Photos Wound Measurements Length: (cm) 2.7 Width: (cm) 0.6 Depth: (cm) 0.1 Area: (cm) 1.272 Volume: (cm) 0.127 % Reduction in Area: 68.9% % Reduction in Volume: 68.9% Epithelialization: Medium (34-66%) Tunneling: No Undermining: No Wound Description Classification: Partial Thickness Wound Margin: Indistinct, nonvisible Exudate Amount: Medium Exudate Type: Serosanguineous Exudate Color: red, brown Foul Odor After Cleansing: No Slough/Fibrino Yes Wound Bed Granulation Amount: Large (67-100%) Exposed Structure Granulation Quality: Red Fascia Exposed: No Necrotic Amount: Small (1-33%) Fat Layer (Subcutaneous Tissue) Exposed: Yes Necrotic Quality: Adherent Slough Tendon Exposed: No Muscle Exposed: No Joint Exposed: No Bone Exposed: No Electronic Signature(s) Signed: 01/10/2021 7:54:52 AM By: Carlene Coria RN Entered By: Carlene Coria on 01/02/2021 13:48:44 [name], [name] ([id number]) -------------------------------------------------------------------------------- Vitals Details Patient Name: [name] Date of Service: 01/02/2021 1:15  PM Medical Record Number: [id number] Patient Account Number: [id number] Date of Birth/Sex: Sep 04, 1950 (70 y.o. F) Treating RN: Carlene Coria Primary Care Timmy Bubeck: [name] Other Clinician: Referring Aloysius Heinle: [name] Treating Aztlan Coll/Extender: [name] in Treatment: 23 Vital Signs Time Taken: 13:45 Temperature (F): 98.6 Height (in): 64 Pulse (bpm): 78 Weight (lbs): 265 Respiratory Rate (breaths/min): 18 Body Mass Index (BMI): 45.5 Blood Pressure (mmHg): 131/69 Reference Range: 80 - 120 mg / dl Electronic Signature(s) Signed: 01/10/2021 7:54:52 AM By: Carlene Coria RN Entered By: Carlene Coria on 01/02/2021 13:46:13

## 2021-01-10 NOTE — Progress Notes (Signed)
[name], [name] ([id number]) Visit Report for 01/09/2021 Arrival Information Details Patient Name: [name], [name] Date of Service: 01/09/2021 2:15 PM Medical Record Number: [id number] Patient Account Number: [id number] Date of Birth/Sex: 21-Mar-1951 (70 y.o. F) Treating RN: Carlene Coria Primary Care Arsema Tusing: [name] Other Clinician: Referring Everly Rubalcava: [name] Treating Sherol Sabas/Extender: [name] in Treatment: 24 Visit Information History Since Last Visit All ordered tests and consults were completed: No Patient Arrived: Wheel Chair Added or deleted any medications: No Arrival Time: 14:48 Any new allergies or adverse reactions: No Accompanied By: husband Had a fall or experienced change in No Transfer Assistance: None activities of daily living that may affect Patient Identification Verified: Yes risk of falls: Secondary Verification Process Completed: Yes Signs or symptoms of abuse/neglect since last visito No Patient Requires Transmission-Based Precautions: No Hospitalized since last visit: No Patient Has Alerts: Yes Implantable device outside of the clinic excluding No cellular tissue based products placed in the center since last visit: Has Dressing in Place as Prescribed: Yes Has Compression in Place as Prescribed: Yes Pain Present Now: No Electronic Signature(s) Signed: 01/10/2021 7:54:12 AM By: Carlene Coria RN Entered By: Carlene Coria on 01/09/2021 14:53:56 Steadham, [name] ([id number]) -------------------------------------------------------------------------------- Clinic Level of Care Assessment Details Patient Name: [name] Date of Service: 01/09/2021 2:15 PM Medical Record Number: [id number] Patient Account Number: [id number] Date of Birth/Sex: [birth year] (70 y.o. F) Treating RN: Carlene Coria Primary Care Katria Botts: [name] Other Clinician: Referring Ganesh Deeg: [name] Treating Amaal Dimartino/Extender: [name] in Treatment: 24 Clinic Level of Care Assessment Items TOOL 1 Quantity Score '[]'$  - Use when EandM and Procedure is performed on INITIAL visit 0 ASSESSMENTS - Nursing Assessment / Reassessment '[]'$  - General Physical Exam (combine w/ comprehensive assessment (listed just below) when performed on new 0 pt. evals) '[]'$  - 0 Comprehensive Assessment (HX, ROS, Risk Assessments, Wounds Hx, etc.) ASSESSMENTS - Wound and Skin Assessment / Reassessment '[]'$  - Dermatologic / Skin Assessment (not related to wound area) 0 ASSESSMENTS - Ostomy and/or Continence Assessment and Care '[]'$  - Incontinence Assessment and Management 0 '[]'$  - 0 Ostomy Care Assessment and Management (repouching, etc.) PROCESS - Coordination of Care '[]'$  - Simple Patient / Family Education for ongoing care 0 '[]'$  - 0 Complex (extensive) Patient / Family Education for ongoing care '[]'$  - 0 Staff obtains Programmer, systems, Records, Test Results / Process Orders '[]'$  - 0 Staff telephones HHA, Nursing Homes / Clarify orders / etc '[]'$  - 0 Routine Transfer to another Facility (non-emergent condition) '[]'$  - 0 Routine Hospital Admission (non-emergent condition) '[]'$  - 0 New Admissions / Biomedical engineer / Ordering NPWT, Apligraf, etc. '[]'$  - 0 Emergency Hospital Admission (emergent condition) PROCESS - Special Needs '[]'$  - Pediatric / Minor Patient Management 0 '[]'$  - 0 Isolation Patient Management '[]'$  - 0 Hearing / Language / Visual special needs '[]'$  - 0 Assessment of Community assistance (transportation, D/C planning, etc.) '[]'$  - 0 Additional assistance / Altered mentation '[]'$  - 0 Support Surface(s) Assessment (bed, cushion, seat, etc.) INTERVENTIONS - Miscellaneous '[]'$  - External ear exam 0 '[]'$  - 0 Patient Transfer (multiple staff / Civil Service fast streamer / Similar devices) '[]'$  - 0 Simple Staple / Suture removal (25 or less) '[]'$  - 0 Complex Staple / Suture removal (26 or more) '[]'$  - 0 Hypo/Hyperglycemic Management (do not check if billed  separately) '[]'$  - 0 Ankle / Brachial Index (ABI) - do not check if billed separately Has the patient been seen at the hospital within the  last three years: Yes Total Score: 0 Level Of Care: ____ [name] ([id number]) Electronic Signature(s) Signed: 01/10/2021 7:54:12 AM By: Carlene Coria RN Entered By: Carlene Coria on 01/09/2021 15:11:10 [name] ([id number]) -------------------------------------------------------------------------------- Lower Extremity Assessment Details Patient Name: [name] Date of Service: 01/09/2021 2:15 PM Medical Record Number: [id number] Patient Account Number: [id number] Date of Birth/Sex: December 08, 1950 (70 y.o. F) Treating RN: Carlene Coria Primary Care [name]: [name] Other Clinician: Referring Stefannie Defeo: [name] Treating Nykira Reddix/Extender: [name] Weeks in Treatment: 24 Edema Assessment Assessed: [Left: No] [Right: No] [Left: Edema] [Right: :] Calf Left: Right: Point of Measurement: 29 cm From Medial Instep 39 cm Ankle Left: Right: Point of Measurement: 10 cm From Medial Instep 20 cm Electronic Signature(s) Signed: 01/10/2021 7:54:12 AM By: Carlene Coria RN Entered By: Carlene Coria on 01/09/2021 15:04:17 Ninneman, [name] ([id number]) -------------------------------------------------------------------------------- Multi Wound Chart Details Patient Name: [name] Date of Service: 01/09/2021 2:15 PM Medical Record Number: [id number] Patient Account Number: [id number] Date of Birth/Sex: 1950-06-23 (70 y.o. F) Treating RN: Carlene Coria Primary Care [name]: [name] Other Clinician: Referring [name]: [name] Treating Cayden Granholm/Extender: [name] in Treatment: 24 Vital Signs Height(in): 32 Pulse(bpm): 47 Weight(lbs): 265 Blood Pressure(mmHg): 142/84 Body Mass Index(BMI): 45 Temperature(F): 98.5 Respiratory Rate(breaths/min): 18 Photos: [N/A:N/A] Wound Location: Left  Calcaneus Left, Midline, Anterior Lower Leg N/A Wounding Event: Pressure Injury Shear/Friction N/A Primary Etiology: Pressure Ulcer Trauma, Other N/A Comorbid History: Cataracts, Chronic Obstructive Cataracts, Chronic Obstructive N/A Pulmonary Disease (COPD), Sleep Pulmonary Disease (COPD), Sleep Apnea, Hypertension, Apnea, Hypertension, Osteoarthritis, Neuropathy Osteoarthritis, Neuropathy Date Acquired: 06/27/2020 11/24/2020 N/A Weeks of Treatment: 24 6 N/A Wound Status: Open Open N/A Measurements L x W x D (cm) 1.8x1.7x0.2 2x0.4x0.1 N/A Area (cm) : 2.403 0.628 N/A Volume (cm) : 0.481 0.063 N/A % Reduction in Area: 52.90% 84.60% N/A % Reduction in Volume: 52.90% 84.60% N/A Classification: Category/Stage III Partial Thickness N/A Exudate Amount: Medium Medium N/A Exudate Type: Serous Serosanguineous N/A Exudate Color: amber red, brown N/A Wound Margin: Thickened Indistinct, nonvisible N/A Granulation Amount: Large (67-100%) Large (67-100%) N/A Granulation Quality: Red, Pink Red N/A Necrotic Amount: Small (1-33%) Small (1-33%) N/A Exposed Structures: Fat Layer (Subcutaneous Tissue): Fat Layer (Subcutaneous Tissue): N/A Yes Yes Fascia: No Fascia: No Tendon: No Tendon: No Muscle: No Muscle: No Joint: No Joint: No Bone: No Bone: No Epithelialization: Small (1-33%) Medium (34-66%) N/A Treatment Notes Electronic Signature(s) Signed: 01/10/2021 7:54:12 AM By: Carlene Coria RN Entered By: Carlene Coria on 01/09/2021 15:09:44 [name] ([id number]) -------------------------------------------------------------------------------- [place] Details Patient Name: [name] Date of Service: 01/09/2021 2:15 PM Medical Record Number: [id number] Patient Account Number: [id number] Date of Birth/Sex: 10-10-1950 (70 y.o. F) Treating RN: Carlene Coria Primary Care Shunna Mikaelian: [name] Other Clinician: Referring Fusae Florio: [name] Treating  Chazz Philson/Extender: [name] in Treatment: 24 Active Inactive Electronic Signature(s) Signed: 01/10/2021 7:54:12 AM By: Carlene Coria RN Entered By: Carlene Coria on 01/09/2021 15:09:14 [name] ([id number]) -------------------------------------------------------------------------------- Pain Assessment Details Patient Name: [name] Date of Service: 01/09/2021 2:15 PM Medical Record Number: [id number] Patient Account Number: [id number] Date of Birth/Sex: 12-17-1950 (70 y.o. F) Treating RN: Carlene Coria Primary Care Tyson [name]: [name] Other Clinician: Referring Latise Dilley: [name] Treating Sevan Mcbroom/Extender: [name] in Treatment: 24 Active Problems Location of Pain Severity and Description of Pain Patient Has Paino No Site Locations Pain Management and Medication Current Pain Management: Electronic Signature(s) Signed: 01/10/2021 7:54:12 AM By: Carlene Coria RN Entered  ByCarlene Coria on 01/09/2021 14:54:20 [name] ([id number]) -------------------------------------------------------------------------------- Patient/Caregiver Education Details Patient Name: [name] Date of Service: 01/09/2021 2:15 PM Medical Record Number: [id number] Patient Account Number: [id number] Date of Birth/Gender: 17-Nov-1950 (70 y.o. F) Treating RN: Carlene Coria Primary Care Physician: [name] Other Clinician: Referring Physician: [name] Treating Physician/Extender: [name] in Treatment: 24 Education Assessment Education Provided To: Patient Education Topics Provided Wound/Skin Impairment: Methods: Explain/Verbal Responses: State content correctly Electronic Signature(s) Signed: 01/10/2021 7:54:12 AM By: Carlene Coria RN Entered By: Carlene Coria on 01/09/2021 15:11:53 Arney, [name] ([id number]) -------------------------------------------------------------------------------- Wound Assessment Details Patient  Name: [name] Date of Service: 01/09/2021 2:15 PM Medical Record Number: [id number] Patient Account Number: [id number] Date of Birth/Sex: 08/19/1950 (70 y.o. F) Treating RN: Carlene Coria Primary Care Demetria Iwai: [name] Other Clinician: Referring Toneisha Savary: [name] Treating Tambra Muller/Extender: [name] in Treatment: 24 Wound Status Wound Number: 12 Primary Pressure Ulcer Etiology: Wound Location: Left Calcaneus Wound Open Wounding Event: Pressure Injury Status: Date Acquired: 06/27/2020 Comorbid Cataracts, Chronic Obstructive Pulmonary Disease Weeks Of Treatment: 24 History: (COPD), Sleep Apnea, Hypertension, Osteoarthritis, Clustered Wound: No Neuropathy Photos Wound Measurements Length: (cm) 1.8 Width: (cm) 1.7 Depth: (cm) 0.2 Area: (cm) 2.403 Volume: (cm) 0.481 % Reduction in Area: 52.9% % Reduction in Volume: 52.9% Epithelialization: Small (1-33%) Tunneling: No Undermining: No Wound Description Classification: Category/Stage III Foul Wound Margin: Thickened Sloug Exudate Amount: Medium Exudate Type: Serous Exudate Color: amber Odor After Cleansing: No h/Fibrino Yes Wound Bed Granulation Amount: Large (67-100%) Exposed Structure Granulation Quality: Red, Pink Fascia Exposed: No Necrotic Amount: Small (1-33%) Fat Layer (Subcutaneous Tissue) Exposed: Yes Necrotic Quality: Adherent Slough Tendon Exposed: No Muscle Exposed: No Gerlich, Jadan H. ([id number]) Joint Exposed: No Bone Exposed: No Electronic Signature(s) Signed: 01/10/2021 7:54:12 AM By: Carlene Coria RN Entered By: Carlene Coria on 01/09/2021 15:29:05 Keithly, [name] ([id number]) -------------------------------------------------------------------------------- Wound Assessment Details Patient Name: [name] Date of Service: 01/09/2021 2:15 PM Medical Record Number: [id number] Patient Account Number: [id number] Date of Birth/Sex: 17-Nov-1950 (70 y.o. F) Treating RN:  Carlene Coria Primary Care Jameica Couts: [name] Other Clinician: Referring Xylan Sheils: [name] Treating Iram Lundberg/Extender: [name] in Treatment: 24 Wound Status Wound Number: 13 Primary Trauma, Other Etiology: Wound Location: Left, Midline, Anterior Lower Leg Wound Open Wounding Event: Shear/Friction Status: Date Acquired: 11/24/2020 Comorbid Cataracts, Chronic Obstructive Pulmonary Disease Weeks Of Treatment: 6 History: (COPD), Sleep Apnea, Hypertension, Osteoarthritis, Clustered Wound: No Neuropathy Photos Wound Measurements Length: (cm) 2 % R Width: (cm) 0.4 % R Depth: (cm) 0.1 Epi Area: (cm) 0.628 Tu Volume: (cm) 0.063 Un eduction in Area: 84.6% eduction in Volume: 84.6% thelialization: Medium (34-66%) nneling: No dermining: No Wound Description Classification: Partial Thickness Fou Wound Margin: Indistinct, nonvisible Slo Exudate Amount: Medium Exudate Type: Serosanguineous Exudate Color: red, brown l Odor After Cleansing: No ugh/Fibrino Yes Wound Bed Granulation Amount: Large (67-100%) Exposed Structure Granulation Quality: Red Fascia Exposed: No Necrotic Amount: Small (1-33%) Fat Layer (Subcutaneous Tissue) Exposed: Yes Necrotic Quality: Adherent Slough Tendon Exposed: No Muscle Exposed: No Hefter, Redina H. ([id number]) Joint Exposed: No Bone Exposed: No Electronic Signature(s) Signed: 01/10/2021 7:54:12 AM By: Carlene Coria RN Entered By: Carlene Coria on 01/09/2021 15:26:37 Ghee, [name] ([id number]) -------------------------------------------------------------------------------- Vitals Details Patient Name: [name] Date of Service: 01/09/2021 2:15 PM Medical Record Number: [id number] Patient Account Number: [id number] Date of Birth/Sex: 1951/02/28 (70 y.o. F) Treating RN: Carlene Coria Primary Care Paublo Warshawsky: [name] Other Clinician: Referring Hallee Mckenny: [name]  Treating Paris Hohn/Extender: [name] Weeks in Treatment: 24 Vital Signs Time Taken: 14:53 Temperature (F): 98.5 Height (in): 64 Pulse (bpm): 80 Weight (lbs): 265 Respiratory Rate (breaths/min): 18 Body Mass Index (BMI): 45.5 Blood Pressure (mmHg): 142/84 Reference Range: 80 - 120 mg / dl Electronic Signature(s) Signed: 01/10/2021 7:54:12 AM By: Carlene Coria RN Entered By: Carlene Coria on 01/09/2021 14:54:14

## 2021-01-13 LAB — AEROBIC CULTURE W GRAM STAIN (SUPERFICIAL SPECIMEN)

## 2021-01-16 ENCOUNTER — Ambulatory Visit: Payer: Medicare HMO | Admitting: Physician Assistant

## 2021-01-18 ENCOUNTER — Ambulatory Visit: Payer: Medicare HMO | Admitting: Physician Assistant

## 2021-01-22 ENCOUNTER — Encounter: Payer: Medicare HMO | Admitting: Internal Medicine

## 2021-01-22 ENCOUNTER — Other Ambulatory Visit: Payer: Self-pay

## 2021-01-22 DIAGNOSIS — L89623 Pressure ulcer of left heel, stage 3: Secondary | ICD-10-CM | POA: Diagnosis not present

## 2021-01-22 NOTE — Progress Notes (Signed)
[name] ([id number]) Visit Report for 01/22/2021 Arrival Information Details Patient Name: [name], [name] Date of Service: 01/22/2021 3:30 PM Medical Record Number: [id number] Patient Account Number: [id number] Date of Birth/Sex: 07-21-1950 (70 y.o. F) Treating RN: [name] Primary Care [name]: [name] Other Clinician: Referring [name]: [name] Treating [name]/Extender: [name] in Treatment: 3 Visit Information History Since Last Visit Added or deleted any medications: No Patient Arrived: Wheel Chair Had a fall or experienced change in No Arrival Time: 15:13 activities of daily living that may affect Accompanied By: husband risk of falls: Transfer Assistance: EasyPivot Patient Signs or symptoms of abuse/neglect since last visito No Lift Has Dressing in Place as Prescribed: Yes Patient Identification Verified: Yes Has Compression in Place as Prescribed: Yes Secondary Verification Process Completed: Yes Pain Present Now: Yes Patient Requires Transmission-Based No Precautions: Patient Has Alerts: Yes Electronic Signature(s) Signed: 01/22/2021 4:33:20 PM By: [name] Entered By: [name] on 01/22/2021 15:14:25 [name] ([id number]) -------------------------------------------------------------------------------- Clinic Level of Care Assessment Details Patient Name: [name] Date of Service: 01/22/2021 3:30 PM Medical Record Number: [id number] Patient Account Number: [id number] Date of Birth/Sex: 1951-04-07 (70 y.o. F) Treating RN: [name] Primary Care Shanyiah Conde: [name] Other Clinician: Referring Leng Montesdeoca: [name] Treating Tabias Swayze/Extender: [name] in Treatment: 26 Clinic Level of Care Assessment Items TOOL 1 Quantity Score []  - Use when EandM and Procedure is performed on INITIAL visit 0 ASSESSMENTS - Nursing Assessment / Reassessment []  - General Physical Exam  (combine w/ comprehensive assessment (listed just below) when performed on new 0 pt. evals) []  - 0 Comprehensive Assessment (HX, ROS, Risk Assessments, Wounds Hx, etc.) ASSESSMENTS - Wound and Skin Assessment / Reassessment []  - Dermatologic / Skin Assessment (not related to wound area) 0 ASSESSMENTS - Ostomy and/or Continence Assessment and Care []  - Incontinence Assessment and Management 0 []  - 0 Ostomy Care Assessment and Management (repouching, etc.) PROCESS - Coordination of Care []  - Simple Patient / Family Education for ongoing care 0 []  - 0 Complex (extensive) Patient / Family Education for ongoing care []  - 0 Staff obtains Programmer, systems, Records, Test Results / Process Orders []  - 0 Staff telephones HHA, Nursing Homes / Clarify orders / etc []  - 0 Routine Transfer to another Facility (non-emergent condition) []  - 0 Routine Hospital Admission (non-emergent condition) []  - 0 New Admissions / Biomedical engineer / Ordering NPWT, Apligraf, etc. []  - 0 Emergency Hospital Admission (emergent condition) PROCESS - Special Needs []  - Pediatric / Minor Patient Management 0 []  - 0 Isolation Patient Management []  - 0 Hearing / Language / Visual special needs []  - 0 Assessment of Community assistance (transportation, D/C planning, etc.) []  - 0 Additional assistance / Altered mentation []  - 0 Support Surface(s) Assessment (bed, cushion, seat, etc.) INTERVENTIONS - Miscellaneous []  - External ear exam 0 []  - 0 Patient Transfer (multiple staff / Civil Service fast streamer / Similar devices) []  - 0 Simple Staple / Suture removal (25 or less) []  - 0 Complex Staple / Suture removal (26 or more) []  - 0 Hypo/Hyperglycemic Management (do not check if billed separately) []  - 0 Ankle / Brachial Index (ABI) - do not check if billed separately Has the patient been seen at the hospital within the last three years: Yes Total Score: 0 Level Of Care: ____ [name] ([id number]) Electronic  Signature(s) Signed: 01/22/2021 4:33:20 PM By: [name] Entered By: [name] on 01/22/2021 15:50:10 [name] ([id number]) --------------------------------------------------------------------------------  Lower Extremity Assessment Details Patient Name: [name] Date of Service: 01/22/2021 3:30 PM Medical Record Number: [id number] Patient Account Number: [id number] Date of Birth/Sex: [birth year] (70 y.o. F) Treating RN: [name] Primary Care Tiwatope Emmitt: [name] Other Clinician: Referring Jelitza Manninen: [name] Treating Cola Gane/Extender: [name] in Treatment: 26 Edema Assessment Assessed: [Left: Yes] [Right: No] Edema: [Left: N] [Right: o] Calf Left: Right: Point of Measurement: 29 cm From Medial Instep 39 cm Ankle Left: Right: Point of Measurement: 10 cm From Medial Instep 21 cm Vascular Assessment Pulses: Dorsalis Pedis Palpable: [Left:Yes] Electronic Signature(s) Signed: 01/22/2021 4:33:20 PM By: [name] Entered By: [name] on 01/22/2021 15:23:07 [name] ([id number]) -------------------------------------------------------------------------------- Multi Wound Chart Details Patient Name: [name] Date of Service: 01/22/2021 3:30 PM Medical Record Number: [id number] Patient Account Number: [id number] Date of Birth/Sex: August 06, 1950 (70 y.o. F) Treating RN: [name] Primary Care Cherita Hebel: [name] Other Clinician: Referring Ronnita Paz: [name] Treating Culver Feighner/Extender: [name] in Treatment: 26 Vital Signs Height(in): 66 Pulse(bpm): 54 Weight(lbs): 265 Blood Pressure(mmHg): 114/71 Body Mass Index(BMI): 45 Temperature(F): 99.2 Respiratory Rate(breaths/min): 16 Photos: [N/A:N/A] Wound Location: Left Calcaneus Left, Midline, Anterior Lower Leg N/A Wounding Event: Pressure Injury Shear/Friction N/A Primary Etiology: Pressure Ulcer Trauma, Other N/A Comorbid History:  Cataracts, Chronic Obstructive Cataracts, Chronic Obstructive N/A Pulmonary Disease (COPD), Sleep Pulmonary Disease (COPD), Sleep Apnea, Hypertension, Apnea, Hypertension, Osteoarthritis, Neuropathy Osteoarthritis, Neuropathy Date Acquired: 06/27/2020 11/24/2020 N/A Weeks of Treatment: 26 8 N/A Wound Status: Open Open N/A Measurements L x W x D (cm) 1.2x1.7x0.2 1.5x0.2x0.1 N/A Area (cm) : 1.602 0.236 N/A Volume (cm) : 0.32 0.024 N/A % Reduction in Area: 68.60% 94.20% N/A % Reduction in Volume: 68.70% 94.10% N/A Classification: Category/Stage III Partial Thickness N/A Exudate Amount: Medium Medium N/A Exudate Type: Serous Serosanguineous N/A Exudate Color: amber red, brown N/A Wound Margin: Thickened Indistinct, nonvisible N/A Granulation Amount: Large (67-100%) None Present (0%) N/A Granulation Quality: Red, Pink N/A N/A Necrotic Amount: Small (1-33%) Large (67-100%) N/A Necrotic Tissue: Adherent Slough Eschar N/A Exposed Structures: Fat Layer (Subcutaneous Tissue): Fat Layer (Subcutaneous Tissue): N/A Yes Yes Fascia: No Fascia: No Tendon: No Tendon: No Muscle: No Muscle: No Joint: No Joint: No Bone: No Bone: No Epithelialization: Small (1-33%) Medium (34-66%) N/A Debridement: N/A Debridement - Excisional N/A Pre-procedure Verification/Time N/A 15:40 N/A Out Taken: Pain Control: N/A Lidocaine N/A Tissue Debrided: N/A Necrotic/Eschar, Subcutaneous, N/A Slough Level: N/A Skin/Subcutaneous Tissue N/A Debridement Area (sq cm): N/A 0.3 N/A Instrument: N/A Curette N/A Bleeding: N/A Minimum N/A Hemostasis Achieved: N/A Pressure N/A Muckey, Dierdre H. ([id number]) Debridement Treatment N/A Procedure was tolerated well N/A Response: Post Debridement N/A 1.5x0.2x0.1 N/A Measurements L x W x D (cm) Post Debridement Volume: N/A 0.024 N/A (cm) Procedures Performed: N/A Debridement N/A Treatment Notes Electronic Signature(s) Signed: 01/22/2021 4:23:07 PM By: Linton Ham  MD Entered By: Linton Ham on 01/22/2021 15:45:31 [name] ([id number]) -------------------------------------------------------------------------------- Multi-Disciplinary Care Plan Details Patient Name: [name] Date of Service: 01/22/2021 3:30 PM Medical Record Number: [id number] Patient Account Number: [id number] Date of Birth/Sex: 1951-02-04 (70 y.o. F) Treating RN: [name] Primary Care Kelso Bibby: [name] Other Clinician: Referring Kenadi Miltner: [name] Treating Kaelin Holford/Extender: [name] in Treatment: 26 Active Inactive Electronic Signature(s) Signed: 01/22/2021 4:33:20 PM By: [name] Entered By: [name] on 01/22/2021 15:23:20 [name] ([id number]) -------------------------------------------------------------------------------- Pain Assessment Details Patient Name: [name] Date of Service: 01/22/2021 3:30 PM Medical Record Number: [id number] Patient Account  Number: [id number] Date of Birth/Sex: 1950-09-02 (70 y.o. F) Treating RN: [name] Primary Care Rashaan Wyles: [name] Other Clinician: Referring Aws Shere: [name] Treating Ajla Mcgeachy/Extender: [name] in Treatment: 26 Active Problems Location of Pain Severity and Description of Pain Patient Has Paino Yes Site Locations Rate the pain. Current Pain Level: 2 Pain Management and Medication Current Pain Management: Electronic Signature(s) Signed: 01/22/2021 4:33:20 PM By: [name] Entered By: [name] on 01/22/2021 15:15:56 [name] ([id number]) -------------------------------------------------------------------------------- Patient/Caregiver Education Details Patient Name: [name] Date of Service: 01/22/2021 3:30 PM Medical Record Number: [id number] Patient Account Number: [id number] Date of Birth/Gender: [birth year] (70 y.o. F) Treating RN: [name] Primary Care Physician: [name]  Other Clinician: Referring Physician: Frazier [name] Treating Physician/Extender: [name] in Treatment: 25 Education Assessment Education Provided To: Patient Education Topics Provided Basic Hygiene: Wound Debridement: Wound/Skin Impairment: Electronic Signature(s) Signed: 01/22/2021 4:33:20 PM By: [name] Entered By: [name] on 01/22/2021 16:02:19 Shreiner, [name] ([id number]) -------------------------------------------------------------------------------- Wound Assessment Details Patient Name: [name] Date of Service: 01/22/2021 3:30 PM Medical Record Number: [id number] Patient Account Number: [id number] Date of Birth/Sex: 1950/07/15 (70 y.o. F) Treating RN: [name] Primary Care Elaysha Bevard: [name] Other Clinician: Referring Hiroko Tregre: [name] Treating Iridiana Fonner/Extender: [name] in Treatment: 26 Wound Status Wound Number: 12 Primary Pressure Ulcer Etiology: Wound Location: Left Calcaneus Wound Open Wounding Event: Pressure Injury Status: Date Acquired: 06/27/2020 Comorbid Cataracts, Chronic Obstructive Pulmonary Disease Weeks Of Treatment: 26 History: (COPD), Sleep Apnea, Hypertension, Osteoarthritis, Clustered Wound: No Neuropathy Photos Wound Measurements Length: (cm) 1.2 Width: (cm) 1.7 Depth: (cm) 0.2 Area: (cm) 1.602 Volume: (cm) 0.32 % Reduction in Area: 68.6% % Reduction in Volume: 68.7% Epithelialization: Small (1-33%) Tunneling: No Undermining: No Wound Description Classification: Category/Stage III Wound Margin: Thickened Exudate Amount: Medium Exudate Type: Serous Exudate Color: amber Foul Odor After Cleansing: No Slough/Fibrino Yes Wound Bed Granulation Amount: Large (67-100%) Exposed Structure Granulation Quality: Red, Pink Fascia Exposed: No Necrotic Amount: Small (1-33%) Fat Layer (Subcutaneous Tissue) Exposed: Yes Necrotic Quality: Adherent Slough Tendon Exposed:  No Muscle Exposed: No Joint Exposed: No Bone Exposed: No Treatment Notes Wound #12 (Calcaneus) Wound Laterality: Left Cleanser Soap and Water Discharge Instruction: Gently cleanse wound with antibacterial soap, rinse and pat dry prior to dressing wounds Mowers, Beda H. ([id number]) Peri-Wound Care Topical Primary Dressing Hydrofera Blue Ready Transfer Foam, 4x5 (in/in) Discharge Instruction: Apply to wound bed-cut hydrofera about 2 cm bigger around for absorption. Ensure hydrofera blue touches wound bed Secondary Dressing Mepilex Border Flex, 4x4 (in/in) Discharge Instruction: Apply to wound as directed. Do not cut. Secured With Compression Wrap Compression Stockings Add-Ons Electronic Signature(s) Signed: 01/22/2021 4:33:20 PM By: [name] Entered By: [name] on 01/22/2021 15:20:40 [name] ([id number]) -------------------------------------------------------------------------------- Wound Assessment Details Patient Name: [name] Date of Service: 01/22/2021 3:30 PM Medical Record Number: [id number] Patient Account Number: [id number] Date of Birth/Sex: [birth year] (70 y.o. F) Treating RN: [name] Primary Care Kacin Dancy: [name] Other Clinician: Referring Kla Bily: [name] Treating Waylon Hershey/Extender: [name] in Treatment: 26 Wound Status Wound Number: 13 Primary Trauma, Other Etiology: Wound Location: Left, Midline, Anterior Lower Leg Wound Open Wounding Event: Shear/Friction Status: Date Acquired: 11/24/2020 Comorbid Cataracts, Chronic Obstructive Pulmonary Disease Weeks Of Treatment: 8 History: (COPD), Sleep Apnea, Hypertension, Osteoarthritis, Clustered Wound: No Neuropathy Photos Wound Measurements Length: (cm) 1.5 Width: (cm) 0.2 Depth: (cm) 0.1 Area: (cm) 0.236 Volume: (cm) 0.024 % Reduction in Area:  94.2% % Reduction in Volume: 94.1% Epithelialization: Medium (34-66%) Wound  Description Classification: Partial Thickness Wound Margin: Indistinct, nonvisible Exudate Amount: Medium Exudate Type: Serosanguineous Exudate Color: red, brown Foul Odor After Cleansing: No Slough/Fibrino Yes Wound Bed Granulation Amount: None Present (0%) Exposed Structure Necrotic Amount: Large (67-100%) Fascia Exposed: No Necrotic Quality: Eschar Fat Layer (Subcutaneous Tissue) Exposed: Yes Tendon Exposed: No Muscle Exposed: No Joint Exposed: No Bone Exposed: No Treatment Notes Wound #13 (Lower Leg) Wound Laterality: Left, Midline, Anterior Cleanser Soap and Water Discharge Instruction: Gently cleanse wound with antibacterial soap, rinse and pat dry prior to dressing wounds Kneebone, Maily H. ([id number]) Peri-Wound Care Topical Primary Dressing Hydrofera Blue Ready Transfer Foam, 4x5 (in/in) Discharge Instruction: Apply to wound bed-cut hydrofera about 2 cm bigger around for absorption. Ensure hydrofera blue touches wound bed Secondary Dressing Mepilex Border Flex, 4x4 (in/in) Discharge Instruction: Apply to wound as directed. Do not cut. Secured With Compression Wrap Compression Stockings Add-Ons Electronic Signature(s) Signed: 01/22/2021 4:33:20 PM By: [name] Entered By: [name] on 01/22/2021 15:22:00 [name] ([id number]) -------------------------------------------------------------------------------- Vitals Details Patient Name: [name] Date of Service: 01/22/2021 3:30 PM Medical Record Number: [id number] Patient Account Number: [id number] Date of Birth/Sex: 1950-10-14 (70 y.o. F) Treating RN: [name] Primary Care Annastyn Silvey: [name] Other Clinician: Referring Teron Blais: [name] Treating Lya Holben/Extender: [name] in Treatment: 26 Vital Signs Time Taken: 15:14 Temperature (F): 99.2 Height (in): 64 Pulse (bpm): 88 Weight (lbs): 265 Respiratory Rate (breaths/min): 16 Body Mass Index (BMI):  45.5 Blood Pressure (mmHg): 114/71 Reference Range: 80 - 120 mg / dl Electronic Signature(s) Signed: 01/22/2021 4:33:20 PM By: [name] Entered ByDonnamarie [name] on 01/22/2021 15:14:54

## 2021-01-23 NOTE — Progress Notes (Signed)
[name], [name] ([id number]) Visit Report for 01/22/2021 Debridement Details Patient Name: [name], [name] Date of Service: 01/22/2021 3:30 PM Medical Record Number: [id number] Patient Account Number: [id number] Date of Birth/Sex: Dec 04, 1950 (70 y.o. F) Treating RN: Donnamarie Poag Primary Care Provider: [name] Other Clinician: Referring Provider: [name] Treating Provider/Extender: Tito Dine in Treatment: 26 Debridement Performed for Wound #13 Left,Midline,Anterior Lower Leg Assessment: Performed By: Physician Ricard Dillon, MD Debridement Type: Debridement Level of Consciousness (Pre- Awake and Alert procedure): Pre-procedure Verification/Time Out Yes - 15:40 Taken: Start Time: 15:40 Pain Control: Lidocaine Total Area Debrided (L x W): 1.5 (cm) x 0.2 (cm) = 0.3 (cm) Tissue and other material Non-Viable, Eschar, Slough, Slough debrided: Level: Non-Viable Tissue Debridement Description: Selective/Open Wound Instrument: Curette Bleeding: Minimum Hemostasis Achieved: Pressure Response to Treatment: Procedure was tolerated well Level of Consciousness (Post- Awake and Alert procedure): Post Debridement Measurements of Total Wound Length: (cm) 1.5 Width: (cm) 0.2 Depth: (cm) 0.1 Volume: (cm) 0.024 Character of Wound/Ulcer Post Debridement: Improved Post Procedure Diagnosis Same as Pre-procedure Electronic Signature(s) Signed: 01/22/2021 4:23:07 PM By: Linton Ham MD Signed: 01/22/2021 4:33:20 PM By: Donnamarie Poag Entered By: Linton Ham on 01/22/2021 15:59:00 [name] ([id number]) -------------------------------------------------------------------------------- HPI Details Patient Name: [name] Date of Service: 01/22/2021 3:30 PM Medical Record Number: [id number] Patient Account Number: [id number] Date of Birth/Sex: Jun 28, 1950 (70 y.o. F) Treating RN: Donnamarie Poag Primary Care Provider: [name] Other  Clinician: Referring Provider: [name] Treating Provider/Extender: Tito Dine in Treatment: 26 History of Present Illness HPI Description: 70 year old with a past medical history significant for [name], urinary incontinence, and obesity. She has been seen in the wound clinic before for lower extremity ulcerations treated with compression therapy. she is also known to have hypertension, peripheral vascular disease, COPD, obstructive sleep apnea, lumbar radiculopathy, kyphoscoliosis, urinary issues and tobacco abuse. Smokes a packet of cigarettes a day was recently seen at the [facility] for swelling of her legs and feet with a ulceration on the dorsum of the right foot which has been there for about 6 months. she was recently in the ER about a month ago where she was seen for shortness of breath and swelling of the legs and a chest x-ray was within normal limits had an increase in her BNP and was given Lasix and put on doxycycline for a mild cellulitis and possible UTI. Wounds aresmaller today 11/13/15. Debrided and will continue Santyl. 12/21/2015 -- she was admitted to the hospital overnight on 12/18/2015 and diagnosed with urinary retention and cellulitis of the left lower leg. is past to take clindamycin and use Santyl for her wounds. 01/15/2016 -- come back to see [place] for almost a month and continues to be noncompliant with her dressings 01/30/16 patient presents today for a follow-up visit concerning her ongoing bilateral lower extremity wounds. We have not seen her for the past 2 weeks although we are supposed to be seen her for weekly visits. She currently has a Foley catheter. She tells me that the wounds are intensely painful especially with pressure and palpation at this point in time. Fortunately she is having no interval signs or symptoms of systemic infection but unfortunately the wounds have not improved dramatically since we last saw her. She does  have home health coming to take care of her as well. She is currently not in any compression wraps. 02/06/16 ON evaluation today patient continues to experience discomfort regarding her bilateral lower extremity ulcers. She  has continued to tolerate the dressing changes at this point in time and continues to have a Foley catheter as well. Fortunately she is back this week in the past she has been somewhat noncompliant with follow-ups I'm glad to see her today. She tells me that her pain level varies but can be as high as a 7 out of 10 with manipulation of the wound. She tells me that she used to be on oxycodone which was managed by the pain clinic although she is no longer on that as she tells me that she was actually smoking marijuana at the time and when this was found out they discontinued her pain medication. She no longer is taking anything pain medication wise and she also does not smoke cigarettes nor marijuana at this point. 02/12/16; this is a patient I don't believe I have previously seen. She has multiple sclerosis. She has 4 punched-out areas on the anterior lateral left leg 2 areas on the right these are all in the same condition. Reasonably small [dime size] wounds each was some depth. Surface of these wounds does not look particularly healthy as there is adherent slough. There is no evidence of surrounding infection or inflammation. ABIs in this clinic were 0.87 on the right and 0.81 on the left. She is listed as having PAD and is a smoker. Not a diabetic 02/20/16 patient I gave antibiotics to last week for erythema around both wound sites on the left lateral leg and right lateral leg. This appears to be a lot better. One of the areas on the left leg is healed however she still has 3 punched-out open areas on the left lateral calf and one on the right lateral calf and one on the dorsal foot. She is an ex-smoker quitting 1 month ago. ABIs in this clinic were 0.87 on the right 0.81 on the  left 03/12/16; this is a patient I really don't have a good sense of. It would appear for the first 5 or 6 weeks of her stay in this clinic she was cared for by Dr. Con Memos. She appeared on my schedule in mid October and I saw her twice. She has not been here however in 3 weeks. She has advanced Wound Care at home where she lives with her husband. She has multiple sclerosis. I saw her the first time she had 4 punched-out small wounds on the anterior lateral left leg it appears that she is now down to 2. She also had wounds on the lateral and medial aspect of her right leg which were also small and punched out. The only one that remains is on the right lateral. Because of the nature of her wound I went ahead and ordered formal arterial studies this showed an ABI of 1 on the right and one on the left. TBI of 1.4 on the right and 0.79 on the left. She had normal waveforms. She was in the emergency room on 02/28/16; there is a noted edema of her left leg after a fall. She had a duplex ultrasound that showed no evidence for an acute DVT from the left groin to the popliteal fossa. The study was limited in the calf veins due to edema. Also noticeable that she had a left inguinal enlarged lymph node up to 6.2 cm. She also had a left knee x-ray that showed no acute findings and a right foot x-ray that showed soft tissue swelling but no radiographic evidence of osteomyelitis. The patient does stop smoking 03-19-16  [name] presents today for evaluation of her bilateral lower extremity ulcerations. She states that she has not smoked in several weeks. She denies any pain or discomfort to bilateral lower extremities, tolerating compression therapy as ordered. 03/26/16; I have not seen this patient in 2 weeks however she has done very well with improvements in the areas in question. After we obtain normal arterial studies, increasing the 4-layer compression really seems to look done a nice job here. She has one open  area on the left lateral leg and one on the medial right leg. These have filled in nicely and are now superficial wounds that showed epithelialized. I note the left inguinal lymph node at 6.2 cm. I may need to refer this back to the patient's primary doctor. 04/03/16; patient has 2 remaining wounds 1 on the left lateral leg and one on the right medial leg. Both of filled in nicely since we are able to increase her from a 3 layer to a 4 layer compression. I am also following up on the lymph node notable on her left leg DVT rule out in November 04/10/16; area on the right lateral leg is healed. The area on the left leg is still open but looks considerably better with healthy granulation and less wound area. I12/20/17; last week we healed the patient doubt with regards to the wound on the right leg to her on compression stocking 20-30 mmHg as it turns out I don't think she had a stocking, predictably therefore she has reopened. The left leg as well as the wound on the lateral aspect. Been generally small line 04/24/16; we healed out her right leg 2 weeks ago to her own 20-30 mm compression stockings although as it turns out she didn't have these and did not purchase some apparently because of financial issues. The left leg wound is on the lateral aspect. Both of these wounds are just about healed. 05/01/16; her wounds on the left leg are healed out today. The area on the right leg is also healed. Vitamin diligent effort of our staff we have not been able to get any form of compression stocking to this patient. The orders for juxtalite's were given to home health this never materialized. We ordered compression stockings I don't think she was able to afford these. We had a discussion today with both the patient and her husband without these, will accumulate edema and the wounds will simply reopen again. 05/14/16 READMISSION [name], [name] ([id number]) this is a patient we healed out 2 weeks ago. As  bilateral lower extremity venous wounds probably some degree of lymphedema. At the beginning in November I did a duplex ultrasound of her left leg that was negative for DVT but did show a lymph node in the left inguinal area presumably reactive. We've been using compression to her lower extremities eventually healed all her wounds on her bilateral calves but we were not able to get any form of compression stockings for her in spite of intense efforts of our staff. She returns today with reopening on the left leg did not the right she has several superficial areas anteriorly but an actual frank ulcer on the lateral left calf. This is about the size of a dime or smaller. She does not really complain of pain. The patient has a history of PAD however arterial studies we did in November were normal. Her ABIs and Doppler waveforms were both within the normal limits. The patient has a history of [name]. In discussing  things with her today it would appear that the opening was noted 2 weeks ago according to the patient. We gave her Tubigrip stockings when she left the clinic 05/21/16; she has not yet had the duplex ultrasound of her thigh, this is booked for Thursday. The wound on the left lateral lower leg is improved 05/28/16; her duplex ultrasound of the left leg specifically her left thigh did not show a DVT however did show hypoechogenic enlarged left inguinal lymph nodes up to 6.9 x 3.7 x 6.8. This also showed in the one in November however maximal diameter was 6.2, at that point I thought these were reactive secondary to cellulitis however there is obviously a differential here. Her wound on the left lower leg is closed. She has a superficial open area on the base of her left fifth metatarsal head on the plantar foot. It looks as though she has some wrap injuries on the anterior leg 06/05/16; since she was last year she had a fall on 1/31. She was seen in the ER but sent back home. I think she is also been seen  by her primary physician who picked up on the swelling and the lymphadenopathy. She is ordered a CT scan of the abdomen and pelvis however this will be without contrast because of stage IIIB renal insufficiency. Nevertheless this should be a start the workup to make sure there is not is more systemic problem here. She will probably need consideration of a biopsy of the lymph node in her inguinal area, this would need general surgery. As far as her wounds are concern today the area on the left plantar foot is healed. She has a small weeping area on the left lower leg. The wrap injury from last week has largely Pardoxically there is a lot less edema in the left thigh. 06/12/16; CT scan is on Friday. She has 2 small open areas one on the lateral left lower leg and one on the anterior lower leg on the left. Both of these looks healthy. I reduced her compression last week to Kerlix and Coban this seems to have not a reasonable job 07/02/16; the patient is now in the skilled facility. I think this was arranged out of the ER when she fell at home although I'm not exactly sure. She is going for I think a laparotomy for the pelvic mass next week. All the wounds in the left leg have healed Readmission: 07/20/2020 upon evaluation today patient presents for reevaluation here in clinic unfortunately she is having issues with a significant stage III pressure ulcer to her left heel. This is something that occurred when she was in the hospital sick. And subsequently though she does not appear to have any new injury she is still trying to recover from the pressure injury which was quite severe during that time. I do not see any signs of infection right now which is great news. With that being said she has been require some debridement to try to clear this up as much as possible. We need to get the wound VAC cleared so that she can see overall healing and improvement that were looking for. The patient does have a history  of hypertension, chronic kidney disease stage III, COPD, and multiple sclerosis. Currently she has been trying to do what she can to keep pressure off of her heel while in bed and in other locations as well. 08/04/20 upon evaluation today patient appears to be doing extremely well currently in regard to  her wound on the heel. I do believe the Iodoflex has helped loosen things appear. Unfortunately she tells me that the home health nurse has not been putting the Iodoflex on the wound. That is news to [place] and that they tell me they got supplies in the mail but they did not see the Iodoflex being used. Nonetheless I am wondering if this is just something that they missed or if it truly has not been. I did tell them to look when they get home in their box of supplies to see if they saw the dressing which showed and what it should look like. 08/11/2020 upon evaluation today patient appears to be doing decently well in regard to her wound although unfortunately this is showing signs of some blue-green drainage consistent with Pseudomonas it does have a little worried in this regard. Fortunately there is no evidence of active infection which is great news. No fevers, chills, nausea, vomiting, or diarrhea. 08/18/2020 upon evaluation today patient appears to be doing well with regard to her wound. She has been tolerating the dressing changes without complication. Fortunately there is no signs of active infection at this time. No fevers, chills, nausea, vomiting, or diarrhea. 4/29; left heel wound pressure ulcer. Slightly smaller. 09/04/2020 upon evaluation today patient appears to be doing well with regard to her wound. This is actually on her heel and seems to be doing well other than the fact that is a little bit moist from excessive drainage. There does not appear to be any signs of active infection at this time. No fevers, chills, nausea, vomiting, or diarrhea.. 09/11/2020 upon evaluation today patient appears to  be doing okay in regard to her wound. With that being said she still has quite a bit of drainage noted at this point. Fortunately there is no signs of active infection which is great news overall I am extremely pleased with where things stand today. I do think we need to try to do something a little bit more to help control the drainage a bit better. 5/23; left posterior heel. This actually looks quite good. Using Hydrofera Blue under compression. Dimensions are smaller 09/26/2020 upon evaluation today patient appears to be doing well with regard to her heel ulcer. In fact this is showing signs of excellent improvement and overall I am extremely pleased with where things stand today. I do not see any evidence of infection which is great news and overall I feel like she is headed in the correct direction. 10/09/2020 upon evaluation today patient appears to be doing excellent in regard to her wound. She has been tolerating the dressing changes without complication. Fortunately there is no evidence of active infection which is also excellent news. No fevers, chills, nausea, vomiting, or diarrhea. 10/16/2020 upon evaluation today patient appears to be doing well with regard to her heel ulcer. Unfortunately she is having issues with an area of deep tissue injury/pressure injury over the shin due to the wrap being on way too tight today. Fortunately there does not appear to be any signs of infection currently. No fevers, chills, nausea, vomiting, or diarrhea. 10/23/2020 upon evaluation today patient appears to be doing better in regard to her wound. Fortunately there does not appear to be any signs of active infection at this time which is great news. In general I am extremely pleased with where things stand at this point. The patient does not show any signs of worsening which is excellent. She unfortunately still has the area in question  with regard to the region over the anterior portion of her shin where  she did have some deep tissue injury. Some of this is starting to open up a little bit. With that being said I think the biggest thing we need to do at this point is to try to as much as possible monitor for any additional breakdown and protect the region while it heals up. I think it is actually doing quite well which is great news. Fortunately there does not appear to be any evidence of active infection 11/14/2018 upon evaluation today patient appears to be doing okay in regard to her ischial. Her leg is much more swollen though she has not had a wrap on any type of dressing for some time when home health came out they did not have the Tubigrip. We have not actually seen her since June 27. The patient when I asked her about it seemed to get somewhat upset to be honest. She seems to be having some trouble at home. She states that she had to "raise hell" just to be able to get here to this appointment today. With that being said I really do think she needs to have Dobis, Jamariyah H. ([id number]) this wraps and keep the swelling under control and was doing a whole lot better but now seems to be not doing quite as well to be honest. The anterior portion of her shin does seem to be healing and doing better although it did end up opening into the wound. Again that was where home health the wrap does have a little bit too tight causing this issue previously. 11/27/2020 upon evaluation today patient appears to be doing poorly in regard to her anterior shin where she did have some area where the wrap actually aggravated the spot. With that being said I think that we do need to make sure to pad this extremely well there is already some signs of healing at this point. I did believe this was probably can open even when I saw it last week and to be honest it has opened. The heel also is showing signs of improvement I do see some slough noted but nothing too significant which is great news. No fevers, chills, nausea,  vomiting, or diarrhea. 12/07/2020 upon evaluation today patient appears to be doing decently well in regard to her wounds. Both are showing signs of some slough and biofilm buildup on the surface of the wounds. Fortunately there is no signs of active infection at this time which is great news. No fevers, chills, nausea, vomiting, or diarrhea. 12/15/2020 upon evaluation today patient appears to be doing well with regard to her wounds. She is showing signs of improvement at both locations which is great news. Fortunately I do not see any evidence of active infection at this time which is great as well. 12/25/2020 upon evaluation today patient appears to be doing well with regard to her wound on the posterior heel. This seems to be making some progress which is great news. Unfortunately the biggest issue is that of the shin which again just does not seem to be healing as effectively and quickly as I would like to see. Fortunately there does not appear to be any evidence of active infection which is great news I do believe that we can try to do something a little different using Tubigrip instead of the compression wrapping to see if this will keep pressure off of the shin allow this to heal more effectively and  quickly. 01/02/2021 upon evaluation today patient's wound on the shin actually appears to be making excellent progress which is great news and overall extremely pleased with where things stand today. There is no sign of active infection at this time. No fevers, chills, nausea, vomiting, or diarrhea. Her heel is going require some debridement but overall does not appear to be doing too poorly. 01/09/2021 upon evaluation today patient appears to be doing better in general in regard to her wounds overall. The anterior shin especially seems to be making good progress. I think we are getting close to resolution here. The heel is a little bit more concerning and that it measures a little larger. There is  also some erythema surrounding. I think that we probably need to see about doing something to address this I think a culture is probably appropriate as well. 9/26; patient has 2 wound areas 1 on the left anterior shin and the other on the left Achilles heel. Using Hydrofera Blue to both of these. The patient had a culture done last time that showed Pseudomonas from the left Achilles heel. I am not certain whether she received antibiotics Electronic Signature(s) Signed: 01/22/2021 4:23:07 PM By: Linton Ham MD Entered By: Linton Ham on 01/22/2021 15:52:35 [name] ([id number]) -------------------------------------------------------------------------------- Physical Exam Details Patient Name: [name] Date of Service: 01/22/2021 3:30 PM Medical Record Number: [id number] Patient Account Number: [id number] Date of Birth/Sex: Dec 30, 1950 (70 y.o. F) Treating RN: Donnamarie Poag Primary Care Provider: [name] Other Clinician: Referring Provider: [name] Treating Provider/Extender: Tito Dine in Treatment: 26 Constitutional Sitting or standing Blood Pressure is within target range for patient.. Pulse regular and within target range for patient.Marland Kitchen Respirations regular, non- labored and within target range.. Temperature is normal and within the target range for the patient.Marland Kitchen appears in no distress. Cardiovascular Pulses are palpable. Notes Wound exam o Left anterior leg. I used a #3 curette to remove the eschar. There is still 1 small open area here. o Left posterior heel. The wound looks very healthy under illumination but not much smaller Electronic Signature(s) Signed: 01/22/2021 4:23:07 PM By: Linton Ham MD Entered By: Linton Ham on 01/22/2021 15:49:45 [name] ([id number]) -------------------------------------------------------------------------------- Physician Orders Details Patient Name: [name] Date of Service:  01/22/2021 3:30 PM Medical Record Number: [id number] Patient Account Number: [id number] Date of Birth/Sex: 05/29/1950 (70 y.o. F) Treating RN: Donnamarie Poag Primary Care Provider: [name] Other Clinician: Referring Provider: [name] Treating Provider/Extender: Tito Dine in Treatment: 53 Verbal / Phone Orders: No Diagnosis Coding ICD-10 Coding Code Description [phone] Pressure ulcer of left heel, stage 3 I87.2 Venous insufficiency (chronic) (peripheral) L97.822 Non-pressure chronic ulcer of other part of left lower leg with fat layer exposed I10 Essential (primary) hypertension N18.30 Chronic kidney disease, stage 3 unspecified J44.9 Chronic obstructive pulmonary disease, unspecified G35 Multiple sclerosis Follow-up Appointments o Return Appointment in 1 week. [name]: - [facility] for wound care. May utilize formulary equivalent dressing for wound treatment orders unless otherwise specified. Home Health Nurse may visit PRN to address patientos wound care needs. o Scheduled days for dressing changes to be completed; exception, patient has scheduled wound care visit that day. o **Please direct any NON-WOUND related issues/requests for orders to patient's Primary Care Physician. **If current dressing causes regression in wound condition, may D/C ordered dressing product/s and apply Normal Saline Moist Dressing daily until next [name] or Other  MD appointment. **Notify [facility] of regression in wound condition at [phone]. Edema Control - Lymphedema / Segmental Compressive Device / Other Bilateral Lower Extremities o Tubigrip single layer applied. - size D o Elevate, Exercise Daily and Avoid Standing for Long Periods of Time. o Elevate legs to the level of the heart and pump ankles as often as possible - ELEVATE LEGS o Elevate leg(s) parallel to the floor when  sitting. Off-Loading Left Lower Extremity o Open toe surgical shoe o Turn and reposition every 2 hours o Other: - Keep pressure off left heel Medications-Please add to medication list. o P.O. Antibiotics - Pick up and start antibiotic according to directions for your heel Wound Treatment Wound #12 - Calcaneus Wound Laterality: Left Cleanser: Soap and Water 2 x Per Week/30 Days Discharge Instructions: Gently cleanse wound with antibacterial soap, rinse and pat dry prior to dressing wounds Primary Dressing: Hydrofera Blue Ready Transfer Foam, 4x5 (in/in) (Generic) 2 x Per Week/30 Days Discharge Instructions: Apply to wound bed-cut hydrofera about 2 cm bigger around for absorption. Ensure hydrofera blue touches wound bed Secondary Dressing: Mepilex Border Flex, 4x4 (in/in) 2 x Per Week/30 Days Discharge Instructions: Apply to wound as directed. Do not cut. Wound #13 - Lower Leg Wound Laterality: Left, Midline, Anterior Cleanser: Soap and Water 2 x Per Week/30 Days Discharge Instructions: Gently cleanse wound with antibacterial soap, rinse and pat dry prior to dressing wounds [name], [name] H. ([id number]) Primary Dressing: Hydrofera Blue Ready Transfer Foam, 4x5 (in/in) (Generic) 2 x Per Week/30 Days Discharge Instructions: Apply to wound bed-cut hydrofera about 2 cm bigger around for absorption. Ensure hydrofera blue touches wound bed Secondary Dressing: Mepilex Border Flex, 4x4 (in/in) 2 x Per Week/30 Days Discharge Instructions: Apply to wound as directed. Do not cut. Patient Medications Allergies: No Known Drug Allergies Notifications Medication Indication Start End cefdinir 01/22/2021 DOSE 1 - oral 300 mg capsule - 1 capsule oral, 2 times per day times 7 days Electronic Signature(s) Signed: 01/22/2021 4:33:20 PM By: Donnamarie Poag Signed: 01/22/2021 7:26:32 PM By: Linton Ham MD Previous Signature: 01/22/2021 4:23:07 PM Version By: Linton Ham MD Entered By: Donnamarie Poag on  01/22/2021 16:22:41 [name] ([id number]) -------------------------------------------------------------------------------- Problem List Details Patient Name: [name] Date of Service: 01/22/2021 3:30 PM Medical Record Number: [id number] Patient Account Number: [id number] Date of Birth/Sex: [birth year] (70 y.o. F) Treating RN: Donnamarie Poag Primary Care Provider: [name] Other Clinician: Referring Provider: [name] Treating Provider/Extender: Tito Dine in Treatment: 26 Active Problems ICD-10 Encounter Code Description Active Date MDM Diagnosis L89.623 Pressure ulcer of left heel, stage 3 07/20/2020 No Yes I87.2 Venous insufficiency (chronic) (peripheral) 01/02/2021 No Yes L97.822 Non-pressure chronic ulcer of other part of left lower leg with fat layer 11/27/2020 No Yes exposed I10 Essential (primary) hypertension 07/20/2020 No Yes N18.30 Chronic kidney disease, stage 3 unspecified 07/20/2020 No Yes J44.9 Chronic obstructive pulmonary disease, unspecified 07/20/2020 No Yes G35 Multiple sclerosis 07/20/2020 No Yes Inactive Problems Resolved Problems Electronic Signature(s) Signed: 01/22/2021 4:23:07 PM By: Linton Ham MD Entered By: Linton Ham on 01/22/2021 15:44:53 [name], [name] ([id number]) -------------------------------------------------------------------------------- Progress Note Details Patient Name: [name] Date of Service: 01/22/2021 3:30 PM Medical Record Number: [id number] Patient Account Number: [id number] Date of Birth/Sex: [birth year] (70 y.o. F) Treating RN: Donnamarie Poag Primary Care Provider: [name] Other Clinician: Referring Provider: [name] Treating Provider/Extender: Tito Dine in Treatment: 26 Subjective History of Present Illness (HPI) 70 year old with a past medical  history significant for [name], urinary incontinence, and obesity. She has been seen in the wound clinic before  for lower extremity ulcerations treated with compression therapy. she is also known to have hypertension, peripheral vascular disease, COPD, obstructive sleep apnea, lumbar radiculopathy, kyphoscoliosis, urinary issues and tobacco abuse. Smokes a packet of cigarettes a day was recently seen at the [facility] for swelling of her legs and feet with a ulceration on the dorsum of the right foot which has been there for about 6 months. she was recently in the ER about a month ago where she was seen for shortness of breath and swelling of the legs and a chest x-ray was within normal limits had an increase in her BNP and was given Lasix and put on doxycycline for a mild cellulitis and possible UTI. Wounds aresmaller today 11/13/15. Debrided and will continue Santyl. 12/21/2015 -- she was admitted to the hospital overnight on 12/18/2015 and diagnosed with urinary retention and cellulitis of the left lower leg. is past to take clindamycin and use Santyl for her wounds. 01/15/2016 -- come back to see [place] for almost a month and continues to be noncompliant with her dressings 01/30/16 patient presents today for a follow-up visit concerning her ongoing bilateral lower extremity wounds. We have not seen her for the past 2 weeks although we are supposed to be seen her for weekly visits. She currently has a Foley catheter. She tells me that the wounds are intensely painful especially with pressure and palpation at this point in time. Fortunately she is having no interval signs or symptoms of systemic infection but unfortunately the wounds have not improved dramatically since we last saw her. She does have home health coming to take care of her as well. She is currently not in any compression wraps. 02/06/16 ON evaluation today patient continues to experience discomfort regarding her bilateral lower extremity ulcers. She has continued to tolerate the dressing changes at this point in time and continues  to have a Foley catheter as well. Fortunately she is back this week in the past she has been somewhat noncompliant with follow-ups I'm glad to see her today. She tells me that her pain level varies but can be as high as a 7 out of 10 with manipulation of the wound. She tells me that she used to be on oxycodone which was managed by the pain clinic although she is no longer on that as she tells me that she was actually smoking marijuana at the time and when this was found out they discontinued her pain medication. She no longer is taking anything pain medication wise and she also does not smoke cigarettes nor marijuana at this point. 02/12/16; this is a patient I don't believe I have previously seen. She has multiple sclerosis. She has 4 punched-out areas on the anterior lateral left leg 2 areas on the right these are all in the same condition. Reasonably small [dime size] wounds each was some depth. Surface of these wounds does not look particularly healthy as there is adherent slough. There is no evidence of surrounding infection or inflammation. ABIs in this clinic were 0.87 on the right and 0.81 on the left. She is listed as having PAD and is a smoker. Not a diabetic 02/20/16 patient I gave antibiotics to last week for erythema around both wound sites on the left lateral leg and right lateral leg. This appears to be a lot better. One of the areas on the left leg is healed  however she still has 3 punched-out open areas on the left lateral calf and one on the right lateral calf and one on the dorsal foot. She is an ex-smoker quitting 1 month ago. ABIs in this clinic were 0.87 on the right 0.81 on the left 03/12/16; this is a patient I really don't have a good sense of. It would appear for the first 5 or 6 weeks of her stay in this clinic she was cared for by Dr. Con Memos. She appeared on my schedule in mid October and I saw her twice. She has not been here however in 3 weeks. She has advanced Wound Care  at home where she lives with her husband. She has multiple sclerosis. I saw her the first time she had 4 punched-out small wounds on the anterior lateral left leg it appears that she is now down to 2. She also had wounds on the lateral and medial aspect of her right leg which were also small and punched out. The only one that remains is on the right lateral. Because of the nature of her wound I went ahead and ordered formal arterial studies this showed an ABI of 1 on the right and one on the left. TBI of 1.4 on the right and 0.79 on the left. She had normal waveforms. She was in the emergency room on 02/28/16; there is a noted edema of her left leg after a fall. She had a duplex ultrasound that showed no evidence for an acute DVT from the left groin to the popliteal fossa. The study was limited in the calf veins due to edema. Also noticeable that she had a left inguinal enlarged lymph node up to 6.2 cm. She also had a left knee x-ray that showed no acute findings and a right foot x-ray that showed soft tissue swelling but no radiographic evidence of osteomyelitis. The patient does stop smoking 03-19-16 [name] Mendonca presents today for evaluation of her bilateral lower extremity ulcerations. She states that she has not smoked in several weeks. She denies any pain or discomfort to bilateral lower extremities, tolerating compression therapy as ordered. 03/26/16; I have not seen this patient in 2 weeks however she has done very well with improvements in the areas in question. After we obtain normal arterial studies, increasing the 4-layer compression really seems to look done a nice job here. She has one open area on the left lateral leg and one on the medial right leg. These have filled in nicely and are now superficial wounds that showed epithelialized. I note the left inguinal lymph node at 6.2 cm. I may need to refer this back to the patient's primary doctor. 04/03/16; patient has 2 remaining wounds 1 on the  left lateral leg and one on the right medial leg. Both of filled in nicely since we are able to increase her from a 3 layer to a 4 layer compression. I am also following up on the lymph node notable on her left leg DVT rule out in November 04/10/16; area on the right lateral leg is healed. The area on the left leg is still open but looks considerably better with healthy granulation and less wound area. I12/20/17; last week we healed the patient doubt with regards to the wound on the right leg to her on compression stocking 20-30 mmHg as it turns out I don't think she had a stocking, predictably therefore she has reopened. The left leg as well as the wound on the lateral aspect. Been generally  small line 04/24/16; we healed out her right leg 2 weeks ago to her own 20-30 mm compression stockings although as it turns out she didn't have these and did not purchase some apparently because of financial issues. The left leg wound is on the lateral aspect. Both of these wounds are just about healed. 05/01/16; her wounds on the left leg are healed out today. The area on the right leg is also healed. Vitamin diligent effort of our staff we have not been able to get any form of compression stocking to this patient. The orders for juxtalite's were given to home health this never materialized. We ordered compression stockings I don't think she was able to afford these. We had a discussion today with both the patient and her husband without these, will accumulate edema and the wounds will simply reopen again. 05/14/16 READMISSION this is a patient we healed out 2 weeks ago. As bilateral lower extremity venous wounds probably some degree of lymphedema. At the beginning [name], [name] ([id number]) in November I did a duplex ultrasound of her left leg that was negative for DVT but did show a lymph node in the left inguinal area presumably reactive. We've been using compression to her lower extremities eventually  healed all her wounds on her bilateral calves but we were not able to get any form of compression stockings for her in spite of intense efforts of our staff. She returns today with reopening on the left leg did not the right she has several superficial areas anteriorly but an actual frank ulcer on the lateral left calf. This is about the size of a dime or smaller. She does not really complain of pain. The patient has a history of PAD however arterial studies we did in November were normal. Her ABIs and Doppler waveforms were both within the normal limits. The patient has a history of [name]. In discussing things with her today it would appear that the opening was noted 2 weeks ago according to the patient. We gave her Tubigrip stockings when she left the clinic 05/21/16; she has not yet had the duplex ultrasound of her thigh, this is booked for Thursday. The wound on the left lateral lower leg is improved 05/28/16; her duplex ultrasound of the left leg specifically her left thigh did not show a DVT however did show hypoechogenic enlarged left inguinal lymph nodes up to 6.9 x 3.7 x 6.8. This also showed in the one in November however maximal diameter was 6.2, at that point I thought these were reactive secondary to cellulitis however there is obviously a differential here. Her wound on the left lower leg is closed. She has a superficial open area on the base of her left fifth metatarsal head on the plantar foot. It looks as though she has some wrap injuries on the anterior leg 06/05/16; since she was last year she had a fall on 1/31. She was seen in the ER but sent back home. I think she is also been seen by her primary physician who picked up on the swelling and the lymphadenopathy. She is ordered a CT scan of the abdomen and pelvis however this will be without contrast because of stage IIIB renal insufficiency. Nevertheless this should be a start the workup to make sure there is not is more systemic problem  here. She will probably need consideration of a biopsy of the lymph node in her inguinal area, this would need general surgery. As far as her wounds are concern  today the area on the left plantar foot is healed. She has a small weeping area on the left lower leg. The wrap injury from last week has largely Pardoxically there is a lot less edema in the left thigh. 06/12/16; CT scan is on Friday. She has 2 small open areas one on the lateral left lower leg and one on the anterior lower leg on the left. Both of these looks healthy. I reduced her compression last week to Kerlix and Coban this seems to have not a reasonable job 07/02/16; the patient is now in the skilled facility. I think this was arranged out of the ER when she fell at home although I'm not exactly sure. She is going for I think a laparotomy for the pelvic mass next week. All the wounds in the left leg have healed Readmission: 07/20/2020 upon evaluation today patient presents for reevaluation here in clinic unfortunately she is having issues with a significant stage III pressure ulcer to her left heel. This is something that occurred when she was in the hospital sick. And subsequently though she does not appear to have any new injury she is still trying to recover from the pressure injury which was quite severe during that time. I do not see any signs of infection right now which is great news. With that being said she has been require some debridement to try to clear this up as much as possible. We need to get the wound VAC cleared so that she can see overall healing and improvement that were looking for. The patient does have a history of hypertension, chronic kidney disease stage III, COPD, and multiple sclerosis. Currently she has been trying to do what she can to keep pressure off of her heel while in bed and in other locations as well. 08/04/20 upon evaluation today patient appears to be doing extremely well currently in regard to her  wound on the heel. I do believe the Iodoflex has helped loosen things appear. Unfortunately she tells me that the home health nurse has not been putting the Iodoflex on the wound. That is news to [place] and that they tell me they got supplies in the mail but they did not see the Iodoflex being used. Nonetheless I am wondering if this is just something that they missed or if it truly has not been. I did tell them to look when they get home in their box of supplies to see if they saw the dressing which showed and what it should look like. 08/11/2020 upon evaluation today patient appears to be doing decently well in regard to her wound although unfortunately this is showing signs of some blue-green drainage consistent with Pseudomonas it does have a little worried in this regard. Fortunately there is no evidence of active infection which is great news. No fevers, chills, nausea, vomiting, or diarrhea. 08/18/2020 upon evaluation today patient appears to be doing well with regard to her wound. She has been tolerating the dressing changes without complication. Fortunately there is no signs of active infection at this time. No fevers, chills, nausea, vomiting, or diarrhea. 4/29; left heel wound pressure ulcer. Slightly smaller. 09/04/2020 upon evaluation today patient appears to be doing well with regard to her wound. This is actually on her heel and seems to be doing well other than the fact that is a little bit moist from excessive drainage. There does not appear to be any signs of active infection at this time. No fevers, chills, nausea, vomiting, or  diarrhea.. 09/11/2020 upon evaluation today patient appears to be doing okay in regard to her wound. With that being said she still has quite a bit of drainage noted at this point. Fortunately there is no signs of active infection which is great news overall I am extremely pleased with where things stand today. I do think we need to try to do something a little bit  more to help control the drainage a bit better. 5/23; left posterior heel. This actually looks quite good. Using Hydrofera Blue under compression. Dimensions are smaller 09/26/2020 upon evaluation today patient appears to be doing well with regard to her heel ulcer. In fact this is showing signs of excellent improvement and overall I am extremely pleased with where things stand today. I do not see any evidence of infection which is great news and overall I feel like she is headed in the correct direction. 10/09/2020 upon evaluation today patient appears to be doing excellent in regard to her wound. She has been tolerating the dressing changes without complication. Fortunately there is no evidence of active infection which is also excellent news. No fevers, chills, nausea, vomiting, or diarrhea. 10/16/2020 upon evaluation today patient appears to be doing well with regard to her heel ulcer. Unfortunately she is having issues with an area of deep tissue injury/pressure injury over the shin due to the wrap being on way too tight today. Fortunately there does not appear to be any signs of infection currently. No fevers, chills, nausea, vomiting, or diarrhea. 10/23/2020 upon evaluation today patient appears to be doing better in regard to her wound. Fortunately there does not appear to be any signs of active infection at this time which is great news. In general I am extremely pleased with where things stand at this point. The patient does not show any signs of worsening which is excellent. She unfortunately still has the area in question with regard to the region over the anterior portion of her shin where she did have some deep tissue injury. Some of this is starting to open up a little bit. With that being said I think the biggest thing we need to do at this point is to try to as much as possible monitor for any additional breakdown and protect the region while it heals up. I think it is actually doing  quite well which is great news. Fortunately there does not appear to be any evidence of active infection 11/14/2018 upon evaluation today patient appears to be doing okay in regard to her ischial. Her leg is much more swollen though she has not had a wrap on any type of dressing for some time when home health came out they did not have the Tubigrip. We have not actually seen her since June 27. The patient when I asked her about it seemed to get somewhat upset to be honest. She seems to be having some trouble at home. She states that she had to "raise hell" just to be able to get here to this appointment today. With that being said I really do think she needs to have this wraps and keep the swelling under control and was doing a whole lot better but now seems to be not doing quite as well to be honest. The Bulow, Falen H. ([id number]) anterior portion of her shin does seem to be healing and doing better although it did end up opening into the wound. Again that was where home health the wrap does have a little bit too  tight causing this issue previously. 11/27/2020 upon evaluation today patient appears to be doing poorly in regard to her anterior shin where she did have some area where the wrap actually aggravated the spot. With that being said I think that we do need to make sure to pad this extremely well there is already some signs of healing at this point. I did believe this was probably can open even when I saw it last week and to be honest it has opened. The heel also is showing signs of improvement I do see some slough noted but nothing too significant which is great news. No fevers, chills, nausea, vomiting, or diarrhea. 12/07/2020 upon evaluation today patient appears to be doing decently well in regard to her wounds. Both are showing signs of some slough and biofilm buildup on the surface of the wounds. Fortunately there is no signs of active infection at this time which is great news. No fevers,  chills, nausea, vomiting, or diarrhea. 12/15/2020 upon evaluation today patient appears to be doing well with regard to her wounds. She is showing signs of improvement at both locations which is great news. Fortunately I do not see any evidence of active infection at this time which is great as well. 12/25/2020 upon evaluation today patient appears to be doing well with regard to her wound on the posterior heel. This seems to be making some progress which is great news. Unfortunately the biggest issue is that of the shin which again just does not seem to be healing as effectively and quickly as I would like to see. Fortunately there does not appear to be any evidence of active infection which is great news I do believe that we can try to do something a little different using Tubigrip instead of the compression wrapping to see if this will keep pressure off of the shin allow this to heal more effectively and quickly. 01/02/2021 upon evaluation today patient's wound on the shin actually appears to be making excellent progress which is great news and overall extremely pleased with where things stand today. There is no sign of active infection at this time. No fevers, chills, nausea, vomiting, or diarrhea. Her heel is going require some debridement but overall does not appear to be doing too poorly. 01/09/2021 upon evaluation today patient appears to be doing better in general in regard to her wounds overall. The anterior shin especially seems to be making good progress. I think we are getting close to resolution here. The heel is a little bit more concerning and that it measures a little larger. There is also some erythema surrounding. I think that we probably need to see about doing something to address this I think a culture is probably appropriate as well. 9/26; patient has 2 wound areas 1 on the left anterior shin and the other on the left Achilles heel. Using Hydrofera Blue to both of these. The  patient had a culture done last time that showed Pseudomonas from the left Achilles heel. I am not certain whether she received antibiotics Objective Constitutional Sitting or standing Blood Pressure is within target range for patient.. Pulse regular and within target range for patient.Marland Kitchen Respirations regular, non- labored and within target range.. Temperature is normal and within the target range for the patient.Marland Kitchen appears in no distress. Vitals Time Taken: 3:14 PM, Height: 64 in, Weight: 265 lbs, BMI: 45.5, Temperature: 99.2 F, Pulse: 88 bpm, Respiratory Rate: 16 breaths/min, Blood Pressure: 114/71 mmHg. Cardiovascular Pulses are palpable. General Notes:  Wound exam Left anterior leg. I used a #3 curette to remove the eschar. There is still 1 small open area here. Left posterior heel. The wound looks very healthy under illumination but not much smaller Integumentary (Hair, Skin) Wound #12 status is Open. Original cause of wound was Pressure Injury. The date acquired was: 06/27/2020. The wound has been in treatment 26 weeks. The wound is located on the Left Calcaneus. The wound measures 1.2cm length x 1.7cm width x 0.2cm depth; 1.602cm^2 area and 0.32cm^3 volume. There is Fat Layer (Subcutaneous Tissue) exposed. There is no tunneling or undermining noted. There is a medium amount of serous drainage noted. The wound margin is thickened. There is large (67-100%) red, pink granulation within the wound bed. There is a small (1-33%) amount of necrotic tissue within the wound bed including Adherent Slough. Wound #13 status is Open. Original cause of wound was Shear/Friction. The date acquired was: 11/24/2020. The wound has been in treatment 8 weeks. The wound is located on the Left,Midline,Anterior Lower Leg. The wound measures 1.5cm length x 0.2cm width x 0.1cm depth; 0.236cm^2 area and 0.024cm^3 volume. There is Fat Layer (Subcutaneous Tissue) exposed. There is a medium amount of  serosanguineous drainage noted. The wound margin is indistinct and nonvisible. There is no granulation within the wound bed. There is a large (67-100%) amount of necrotic tissue within the wound bed including Eschar. Assessment Active Problems [name], [name]. ([id number]) ICD-10 Pressure ulcer of left heel, stage 3 Venous insufficiency (chronic) (peripheral) Non-pressure chronic ulcer of other part of left lower leg with fat layer exposed Essential (primary) hypertension Chronic kidney disease, stage 3 unspecified Chronic obstructive pulmonary disease, unspecified Multiple sclerosis Procedures Wound #13 Pre-procedure diagnosis of Wound #13 is a Trauma, Other located on the Left,Midline,Anterior Lower Leg . There was a Selective/Open Wound Non-Viable Tissue Debridement with a total area of 0.3 sq cm performed by Ricard Dillon, MD. With the following instrument(s): Curette to remove Non-Viable tissue/material. Material removed includes Eschar and Slough and after achieving pain control using Lidocaine. A time out was conducted at 15:40, prior to the start of the procedure. A Minimum amount of bleeding was controlled with Pressure. The procedure was tolerated well. Post Debridement Measurements: 1.5cm length x 0.2cm width x 0.1cm depth; 0.024cm^3 volume. Character of Wound/Ulcer Post Debridement is improved. Post procedure Diagnosis Wound #13: Same as Pre-Procedure Plan Follow-up Appointments: Return Appointment in 1 week. Home Health: [facility] for wound care. May utilize formulary equivalent dressing for wound treatment orders unless otherwise specified. Home Health Nurse may visit [facility] to address patient s wound care needs. Scheduled days for dressing changes to be completed; exception, patient has scheduled wound care visit that day. **Please direct any NON-WOUND related issues/requests for orders to patient's Primary Care Physician. **If  current dressing causes regression in wound condition, may D/C ordered dressing product/s and apply Normal Saline Moist Dressing daily until next [place] or Other MD appointment. **Notify [facility] of regression in wound condition at [phone]. Edema Control - Lymphedema / Segmental Compressive Device / Other: Tubigrip single layer applied. - size D Elevate, Exercise Daily and Avoid Standing for Long Periods of Time. Elevate legs to the level of the heart and pump ankles as often as possible - ELEVATE LEGS Elevate leg(s) parallel to the floor when sitting. Off-Loading: Open toe surgical shoe Turn and reposition every 2 hours Other: - Keep pressure off left heel WOUND #12: -  Calcaneus Wound Laterality: Left Cleanser: Soap and Water 2 x Per Week/30 Days Discharge Instructions: Gently cleanse wound with antibacterial soap, rinse and pat dry prior to dressing wounds Primary Dressing: Hydrofera Blue Ready Transfer Foam, 4x5 (in/in) (Generic) 2 x Per Week/30 Days Discharge Instructions: Apply to wound bed-cut hydrofera about 2 cm bigger around for absorption. Ensure hydrofera blue touches wound bed Secondary Dressing: Mepilex Border Flex, 4x4 (in/in) 2 x Per Week/30 Days Discharge Instructions: Apply to wound as directed. Do not cut. WOUND #13: - Lower Leg Wound Laterality: Left, Midline, Anterior Cleanser: Soap and Water 2 x Per Week/30 Days Discharge Instructions: Gently cleanse wound with antibacterial soap, rinse and pat dry prior to dressing wounds Primary Dressing: Hydrofera Blue Ready Transfer Foam, 4x5 (in/in) (Generic) 2 x Per Week/30 Days Discharge Instructions: Apply to wound bed-cut hydrofera about 2 cm bigger around for absorption. Ensure hydrofera blue touches wound bed Secondary Dressing: Mepilex Border Flex, 4x4 (in/in) 2 x Per Week/30 Days Discharge Instructions: Apply to wound as directed. Do not cut. 1. I did not change the primary dressing which  is Hydrofera Blue to both areas. 2. Although the left heel looked very healthy it apparently had not changed in dimensions. No evidence of surrounding infection 3. Culture apparently was done on 9/13 that showed a few Pseudomonas. She is cultured this before. I cannot see that she received antibiotics I gave her cefdinir 300 twice daily x57days. Might consider topical therapy if the wound continues to stall [name], [name] ([id number]) Electronic Signature(s) Signed: 01/22/2021 4:13:27 PM By: Linton Ham MD Entered By: Linton Ham on 01/22/2021 16:13:24 [name] ([id number]) -------------------------------------------------------------------------------- SuperBill Details Patient Name: [name] Date of Service: 01/22/2021 Medical Record Number: [id number] Patient Account Number: [id number] Date of Birth/Sex: March 10, 1951 (70 y.o. F) Treating RN: Donnamarie Poag Primary Care Provider: [name] Other Clinician: Referring Provider: [name] Treating Provider/Extender: Tito Dine in Treatment: 26 Diagnosis Coding ICD-10 Codes Code Description [phone] Pressure ulcer of left heel, stage 3 I87.2 Venous insufficiency (chronic) (peripheral) L97.822 Non-pressure chronic ulcer of other part of left lower leg with fat layer exposed [place] (primary) hypertension N18.30 Chronic kidney disease, stage 3 unspecified J44.9 Chronic obstructive pulmonary disease, unspecified G35 Multiple sclerosis Facility Procedures CPT4 Code: [id number] Description: [phone] - [facility] WOUND 1ST 20 SQ CM OR < Modifier: Quantity: 1 CPT4 Code: Description: ICD-10 Diagnosis Description L89.623 Pressure ulcer of left heel, stage 3 Modifier: Quantity: Physician Procedures CPT4 Code: [id number] Description: 97597 - WC PHYS DEBR WO ANESTH 20 SQ CM Modifier: Quantity: 1 CPT4 Code: Description: ICD-10 Diagnosis Description L89.623 Pressure ulcer of left heel, stage  3 Modifier: Quantity: Electronic Signature(s) Signed: 01/22/2021 4:23:07 PM By: Linton Ham MD Entered By: Linton Ham on 01/22/2021 16:00:12

## 2021-02-01 ENCOUNTER — Encounter: Payer: Medicare Other | Attending: Physician Assistant | Admitting: Physician Assistant

## 2021-02-01 ENCOUNTER — Other Ambulatory Visit: Payer: Self-pay

## 2021-02-01 DIAGNOSIS — I872 Venous insufficiency (chronic) (peripheral): Secondary | ICD-10-CM | POA: Insufficient documentation

## 2021-02-01 DIAGNOSIS — G4733 Obstructive sleep apnea (adult) (pediatric): Secondary | ICD-10-CM | POA: Diagnosis not present

## 2021-02-01 DIAGNOSIS — E669 Obesity, unspecified: Secondary | ICD-10-CM | POA: Insufficient documentation

## 2021-02-01 DIAGNOSIS — L89623 Pressure ulcer of left heel, stage 3: Secondary | ICD-10-CM | POA: Insufficient documentation

## 2021-02-01 DIAGNOSIS — F17210 Nicotine dependence, cigarettes, uncomplicated: Secondary | ICD-10-CM | POA: Diagnosis not present

## 2021-02-01 DIAGNOSIS — R32 Unspecified urinary incontinence: Secondary | ICD-10-CM | POA: Diagnosis not present

## 2021-02-01 DIAGNOSIS — M5416 Radiculopathy, lumbar region: Secondary | ICD-10-CM | POA: Diagnosis not present

## 2021-02-01 DIAGNOSIS — J449 Chronic obstructive pulmonary disease, unspecified: Secondary | ICD-10-CM | POA: Diagnosis not present

## 2021-02-01 DIAGNOSIS — I129 Hypertensive chronic kidney disease with stage 1 through stage 4 chronic kidney disease, or unspecified chronic kidney disease: Secondary | ICD-10-CM | POA: Diagnosis not present

## 2021-02-01 DIAGNOSIS — L97822 Non-pressure chronic ulcer of other part of left lower leg with fat layer exposed: Secondary | ICD-10-CM | POA: Insufficient documentation

## 2021-02-01 DIAGNOSIS — I739 Peripheral vascular disease, unspecified: Secondary | ICD-10-CM | POA: Insufficient documentation

## 2021-02-01 DIAGNOSIS — G35 Multiple sclerosis: Secondary | ICD-10-CM | POA: Insufficient documentation

## 2021-02-01 DIAGNOSIS — M419 Scoliosis, unspecified: Secondary | ICD-10-CM | POA: Insufficient documentation

## 2021-02-01 DIAGNOSIS — N183 Chronic kidney disease, stage 3 unspecified: Secondary | ICD-10-CM | POA: Diagnosis not present

## 2021-02-01 NOTE — Progress Notes (Addendum)
[name] [name] ([id number]) Visit Report for 02/01/2021 Chief Complaint Document Details Patient Name: [name], [name] Date of Service: 02/01/2021 2:15 PM Medical Record Number: [id number] Patient Account Number: [id number] Date of Birth/Sex: [birth year] (70 y.o. F) Treating RN: Carlene Coria Primary Care Provider: [name] Other Clinician: Referring Provider: [name] Treating Provider/Extender: [name] [name] in Treatment: 28 Information Obtained from: Patient Chief Complaint Left Heel Pressure Ulcer Electronic Signature(s) Signed: 02/01/2021 2:32:46 PM By: Worthy Keeler PA-C Entered By: Worthy Keeler on 02/01/2021 14:32:46 [name] [name] ([id number]) -------------------------------------------------------------------------------- HPI Details Patient Name: [name] [name] Date of Service: 02/01/2021 2:15 PM Medical Record Number: [id number] Patient Account Number: [id number] Date of Birth/Sex: 05/26/1950 (70 y.o. F) Treating RN: Carlene Coria Primary Care Provider: [name] Other Clinician: Referring Provider: [name] Treating Provider/Extender: [name] [name] in Treatment: 28 History of Present Illness HPI Description: 70 year old with a past medical history significant for [name], urinary incontinence, and obesity. She has been seen in the wound clinic before for lower extremity ulcerations treated with compression therapy. she is also known to have hypertension, peripheral vascular disease, COPD, obstructive sleep apnea, lumbar radiculopathy, kyphoscoliosis, urinary issues and tobacco abuse. Smokes a packet of cigarettes a day was recently seen at the [facility] for swelling of her legs and feet with a ulceration on the dorsum of the right foot which has been there for about 6 months. she was recently in the ER about a month ago where she was seen for shortness of breath and swelling of the legs and a chest x-ray was within  normal limits had an increase in her BNP and was given Lasix and put on doxycycline for a mild cellulitis and possible UTI. Wounds aresmaller today 11/13/15. Debrided and will continue Santyl. 12/21/2015 -- she was admitted to the hospital overnight on 12/18/2015 and diagnosed with urinary retention and cellulitis of the left lower leg. is past to take clindamycin and use Santyl for her wounds. 01/15/2016 -- come back to see [place] for almost a month and continues to be noncompliant with her dressings 01/30/16 patient presents today for a follow-up visit concerning her ongoing bilateral lower extremity wounds. We have not seen her for the past 2 weeks although we are supposed to be seen her for weekly visits. She currently has a Foley catheter. She tells me that the wounds are intensely painful especially with pressure and palpation at this point in time. Fortunately she is having no interval signs or symptoms of systemic infection but unfortunately the wounds have not improved dramatically since we last saw her. She does have home health coming to take care of her as well. She is currently not in any compression wraps. 02/06/16 ON evaluation today patient continues to experience discomfort regarding her bilateral lower extremity ulcers. She has continued to tolerate the dressing changes at this point in time and continues to have a Foley catheter as well. Fortunately she is back this week in the past she has been somewhat noncompliant with follow-ups I'm glad to see her today. She tells me that her pain level varies but can be as high as a 7 out of 10 with manipulation of the wound. She tells me that she used to be on oxycodone which was managed by the pain clinic although she is no longer on that as she tells me that she was actually smoking marijuana at the time and when this was found out they discontinued her pain medication. She no longer  is taking anything pain medication wise and she also does not  smoke cigarettes nor marijuana at this point. 02/12/16; this is a patient I don't believe I have previously seen. She has multiple sclerosis. She has 4 punched-out areas on the anterior lateral left leg 2 areas on the right these are all in the same condition. Reasonably small [dime size] wounds each was some depth. Surface of these wounds does not look particularly healthy as there is adherent slough. There is no evidence of surrounding infection or inflammation. ABIs in this clinic were 0.87 on the right and 0.81 on the left. She is listed as having PAD and is a smoker. Not a diabetic 02/20/16 patient I gave antibiotics to last week for erythema around both wound sites on the left lateral leg and right lateral leg. This appears to be a lot better. One of the areas on the left leg is healed however she still has 3 punched-out open areas on the left lateral calf and one on the right lateral calf and one on the dorsal foot. She is an ex-smoker quitting 1 month ago. ABIs in this clinic were 0.87 on the right 0.81 on the left 03/12/16; this is a patient I really don't have a good sense of. It would appear for the first 5 or 6 weeks of her stay in this clinic she was cared for by Dr. Con Memos. She appeared on my schedule in mid October and I saw her twice. She has not been here however in 3 weeks. She has advanced Wound Care at home where she lives with her husband. She has multiple sclerosis. I saw her the first time she had 4 punched-out small wounds on the anterior lateral left leg it appears that she is now down to 2. She also had wounds on the lateral and medial aspect of her right leg which were also small and punched out. The only one that remains is on the right lateral. Because of the nature of her wound I went ahead and ordered formal arterial studies this showed an ABI of 1 on the right and one on the left. TBI of 1.4 on the right and 0.79 on the left. She had normal waveforms. She was in the  emergency room on 02/28/16; there is a noted edema of her left leg after a fall. She had a duplex ultrasound that showed no evidence for an acute DVT from the left groin to the popliteal fossa. The study was limited in the calf veins due to edema. Also noticeable that she had a left inguinal enlarged lymph node up to 6.2 cm. She also had a left knee x-ray that showed no acute findings and a right foot x-ray that showed soft tissue swelling but no radiographic evidence of osteomyelitis. The patient does stop smoking 03-19-16 [name] [name] presents today for evaluation of her bilateral lower extremity ulcerations. She states that she has not smoked in several weeks. She denies any pain or discomfort to bilateral lower extremities, tolerating compression therapy as ordered. 03/26/16; I have not seen this patient in 2 weeks however she has done very well with improvements in the areas in question. After we obtain normal arterial studies, increasing the 4-layer compression really seems to look done a nice job here. She has one open area on the left lateral leg and one on the medial right leg. These have filled in nicely and are now superficial wounds that showed epithelialized. I note the left inguinal lymph node at  6.2 cm. I may need to refer this back to the patient's primary doctor. 04/03/16; patient has 2 remaining wounds 1 on the left lateral leg and one on the right medial leg. Both of filled in nicely since we are able to increase her from a 3 layer to a 4 layer compression. I am also following up on the lymph node notable on her left leg DVT rule out in November 04/10/16; area on the right lateral leg is healed. The area on the left leg is still open but looks considerably better with healthy granulation and less wound area. I12/20/17; last week we healed the patient doubt with regards to the wound on the right leg to her on compression stocking 20-30 mmHg as it turns out I don't think she had a stocking,  predictably therefore she has reopened. The left leg as well as the wound on the lateral aspect. Been generally small line 04/24/16; we healed out her right leg 2 weeks ago to her own 20-30 mm compression stockings although as it turns out she didn't have these and did not purchase some apparently because of financial issues. The left leg wound is on the lateral aspect. Both of these wounds are just about healed. 05/01/16; her wounds on the left leg are healed out today. The area on the right leg is also healed. Vitamin diligent effort of our staff we have not been able to get any form of compression stocking to this patient. The orders for juxtalite's were given to home health this never materialized. We ordered compression stockings I don't think she was able to afford these. We had a discussion today with both the patient and her husband without these, will accumulate edema and the wounds will simply reopen again. 05/14/16 READMISSION [name] [name], [name] [name] ([id number]) this is a patient we healed out 2 weeks ago. As bilateral lower extremity venous wounds probably some degree of lymphedema. At the beginning in November I did a duplex ultrasound of her left leg that was negative for DVT but did show a lymph node in the left inguinal area presumably reactive. We've been using compression to her lower extremities eventually healed all her wounds on her bilateral calves but we were not able to get any form of compression stockings for her in spite of intense efforts of our staff. She returns today with reopening on the left leg did not the right she has several superficial areas anteriorly but an actual frank ulcer on the lateral left calf. This is about the size of a dime or smaller. She does not really complain of pain. The patient has a history of PAD however arterial studies we did in November were normal. Her ABIs and Doppler waveforms were both within the normal limits. The patient has a history of [name].  In discussing things with her today it would appear that the opening was noted 2 weeks ago according to the patient. We gave her Tubigrip stockings when she left the clinic 05/21/16; she has not yet had the duplex ultrasound of her thigh, this is booked for Thursday. The wound on the left lateral lower leg is improved 05/28/16; her duplex ultrasound of the left leg specifically her left thigh did not show a DVT however did show hypoechogenic enlarged left inguinal lymph nodes up to 6.9 x 3.7 x 6.8. This also showed in the one in November however maximal diameter was 6.2, at that point I thought these were reactive secondary to cellulitis however there is obviously  a differential here. Her wound on the left lower leg is closed. She has a superficial open area on the base of her left fifth metatarsal head on the plantar foot. It looks as though she has some wrap injuries on the anterior leg 06/05/16; since she was last year she had a fall on 1/31. She was seen in the ER but sent back home. I think she is also been seen by her primary physician who picked up on the swelling and the lymphadenopathy. She is ordered a CT scan of the abdomen and pelvis however this will be without contrast because of stage IIIB renal insufficiency. Nevertheless this should be a start the workup to make sure there is not is more systemic problem here. She will probably need consideration of a biopsy of the lymph node in her inguinal area, this would need general surgery. As far as her wounds are concern today the area on the left plantar foot is healed. She has a small weeping area on the left lower leg. The wrap injury from last week has largely Pardoxically there is a lot less edema in the left thigh. 06/12/16; CT scan is on Friday. She has 2 small open areas one on the lateral left lower leg and one on the anterior lower leg on the left. Both of these looks healthy. I reduced her compression last week to Kerlix and Coban this  seems to have not a reasonable job 07/02/16; the patient is now in the skilled facility. I think this was arranged out of the ER when she fell at home although I'm not exactly sure. She is going for I think a laparotomy for the pelvic mass next week. All the wounds in the left leg have healed Readmission: 07/20/2020 upon evaluation today patient presents for reevaluation here in clinic unfortunately she is having issues with a significant stage III pressure ulcer to her left heel. This is something that occurred when she was in the hospital sick. And subsequently though she does not appear to have any new injury she is still trying to recover from the pressure injury which was quite severe during that time. I do not see any signs of infection right now which is great news. With that being said she has been require some debridement to try to clear this up as much as possible. We need to get the wound VAC cleared so that she can see overall healing and improvement that were looking for. The patient does have a history of hypertension, chronic kidney disease stage III, COPD, and multiple sclerosis. Currently she has been trying to do what she can to keep pressure off of her heel while in bed and in other locations as well. 08/04/20 upon evaluation today patient appears to be doing extremely well currently in regard to her wound on the heel. I do believe the Iodoflex has helped loosen things appear. Unfortunately she tells me that the home health nurse has not been putting the Iodoflex on the wound. That is news to [place] and that they tell me they got supplies in the mail but they did not see the Iodoflex being used. Nonetheless I am wondering if this is just something that they missed or if it truly has not been. I did tell them to look when they get home in their box of supplies to see if they saw the dressing which showed and what it should look like. 08/11/2020 upon evaluation today patient appears to be  doing decently  well in regard to her wound although unfortunately this is showing signs of some blue-green drainage consistent with Pseudomonas it does have a little worried in this regard. Fortunately there is no evidence of active infection which is great news. No fevers, chills, nausea, vomiting, or diarrhea. 08/18/2020 upon evaluation today patient appears to be doing well with regard to her wound. She has been tolerating the dressing changes without complication. Fortunately there is no signs of active infection at this time. No fevers, chills, nausea, vomiting, or diarrhea. 4/29; left heel wound pressure ulcer. Slightly smaller. 09/04/2020 upon evaluation today patient appears to be doing well with regard to her wound. This is actually on her heel and seems to be doing well other than the fact that is a little bit moist from excessive drainage. There does not appear to be any signs of active infection at this time. No fevers, chills, nausea, vomiting, or diarrhea.. 09/11/2020 upon evaluation today patient appears to be doing okay in regard to her wound. With that being said she still has quite a bit of drainage noted at this point. Fortunately there is no signs of active infection which is great news overall I am extremely pleased with where things stand today. I do think we need to try to do something a little bit more to help control the drainage a bit better. 5/23; left posterior heel. This actually looks quite good. Using Hydrofera Blue under compression. Dimensions are smaller 09/26/2020 upon evaluation today patient appears to be doing well with regard to her heel ulcer. In fact this is showing signs of excellent improvement and overall I am extremely pleased with where things stand today. I do not see any evidence of infection which is great news and overall I feel like she is headed in the correct direction. 10/09/2020 upon evaluation today patient appears to be doing excellent in regard to  her wound. She has been tolerating the dressing changes without complication. Fortunately there is no evidence of active infection which is also excellent news. No fevers, chills, nausea, vomiting, or diarrhea. 10/16/2020 upon evaluation today patient appears to be doing well with regard to her heel ulcer. Unfortunately she is having issues with an area of deep tissue injury/pressure injury over the shin due to the wrap being on way too tight today. Fortunately there does not appear to be any signs of infection currently. No fevers, chills, nausea, vomiting, or diarrhea. 10/23/2020 upon evaluation today patient appears to be doing better in regard to her wound. Fortunately there does not appear to be any signs of active infection at this time which is great news. In general I am extremely pleased with where things stand at this point. The patient does not show any signs of worsening which is excellent. She unfortunately still has the area in question with regard to the region over the anterior portion of her shin where she did have some deep tissue injury. Some of this is starting to open up a little bit. With that being said I think the biggest thing we need to do at this point is to try to as much as possible monitor for any additional breakdown and protect the region while it heals up. I think it is actually doing quite well which is great news. Fortunately there does not appear to be any evidence of active infection 11/14/2018 upon evaluation today patient appears to be doing okay in regard to her ischial. Her leg is much more swollen though she has not  had a wrap on any type of dressing for some time when home health came out they did not have the Tubigrip. We have not actually seen her since June 27. The patient when I asked her about it seemed to get somewhat upset to be honest. She seems to be having some trouble at home. She states that she had to "raise hell" just to be able to get here to this  appointment today. With that being said I really do think she needs to have [name] [name], [name] H. ([id number]) this wraps and keep the swelling under control and was doing a whole lot better but now seems to be not doing quite as well to be honest. The anterior portion of her shin does seem to be healing and doing better although it did end up opening into the wound. Again that was where home health the wrap does have a little bit too tight causing this issue previously. 11/27/2020 upon evaluation today patient appears to be doing poorly in regard to her anterior shin where she did have some area where the wrap actually aggravated the spot. With that being said I think that we do need to make sure to pad this extremely well there is already some signs of healing at this point. I did believe this was probably can open even when I saw it last week and to be honest it has opened. The heel also is showing signs of improvement I do see some slough noted but nothing too significant which is great news. No fevers, chills, nausea, vomiting, or diarrhea. 12/07/2020 upon evaluation today patient appears to be doing decently well in regard to her wounds. Both are showing signs of some slough and biofilm buildup on the surface of the wounds. Fortunately there is no signs of active infection at this time which is great news. No fevers, chills, nausea, vomiting, or diarrhea. 12/15/2020 upon evaluation today patient appears to be doing well with regard to her wounds. She is showing signs of improvement at both locations which is great news. Fortunately I do not see any evidence of active infection at this time which is great as well. 12/25/2020 upon evaluation today patient appears to be doing well with regard to her wound on the posterior heel. This seems to be making some progress which is great news. Unfortunately the biggest issue is that of the shin which again just does not seem to be healing as effectively and quickly  as I would like to see. Fortunately there does not appear to be any evidence of active infection which is great news I do believe that we can try to do something a little different using Tubigrip instead of the compression wrapping to see if this will keep pressure off of the shin allow this to heal more effectively and quickly. 01/02/2021 upon evaluation today patient's wound on the shin actually appears to be making excellent progress which is great news and overall extremely pleased with where things stand today. There is no sign of active infection at this time. No fevers, chills, nausea, vomiting, or diarrhea. Her heel is going require some debridement but overall does not appear to be doing too poorly. 01/09/2021 upon evaluation today patient appears to be doing better in general in regard to her wounds overall. The anterior shin especially seems to be making good progress. I think we are getting close to resolution here. The heel is a little bit more concerning and that it measures a little larger. There  is also some erythema surrounding. I think that we probably need to see about doing something to address this I think a culture is probably appropriate as well. 9/26; patient has 2 wound areas 1 on the left anterior shin and the other on the left Achilles heel. Using Hydrofera Blue to both of these. The patient had a culture done last time that showed Pseudomonas from the left Achilles heel. I am not certain whether she received antibiotics 02/01/2021 upon evaluation today patient appears to be doing well with regard to her wound on the shin this is completely healed. In regard to the wound on her heel this is showing signs of improvement which is good news fortunately there is no evidence of active infection which is great and overall I think you are making progress here since the beginning although it has somewhat plateaued a little bit more recently. Electronic Signature(s) Signed: 02/01/2021  2:54:59 PM By: Worthy Keeler PA-C Entered By: Worthy Keeler on 02/01/2021 14:54:58 [name] [name] ([id number]) -------------------------------------------------------------------------------- Physical Exam Details Patient Name: [name] [name] Date of Service: 02/01/2021 2:15 PM Medical Record Number: [id number] Patient Account Number: [id number] Date of Birth/Sex: 1950/11/21 (70 y.o. F) Treating RN: Carlene Coria Primary Care Provider: [name] Other Clinician: Referring Provider: [name] Treating Provider/Extender: [name] [name] in Treatment: 92 Constitutional Well-nourished and well-hydrated in no acute distress. Respiratory normal breathing without difficulty. Psychiatric this patient is able to make decisions and demonstrates good insight into disease process. Alert and Oriented x 3. pleasant and cooperative. Notes Upon inspection patient's wound bed showed signs of good granulation epithelization at this point. Fortunately there does not appear to be any signs of active infection which is great news and overall very pleased with where things stand. No fevers, chills, nausea, vomiting, or diarrhea. There was some slough on the surface of the wound this is easily cleaned away with saline and gauze no sharp debridement was even necessary. Electronic Signature(s) Signed: 02/01/2021 2:55:20 PM By: Worthy Keeler PA-C Entered By: Worthy Keeler on 02/01/2021 14:55:20 [name] [name] ([id number]) -------------------------------------------------------------------------------- Physician Orders Details Patient Name: [name] [name] Date of Service: 02/01/2021 2:15 PM Medical Record Number: [id number] Patient Account Number: [id number] Date of Birth/Sex: February 01, 1951 (70 y.o. F) Treating RN: Carlene Coria Primary Care Provider: [name] Other Clinician: Referring Provider: [name] Treating Provider/Extender: [name] [name] in Treatment:  28 Verbal / Phone Orders: No Diagnosis Coding ICD-10 Coding Code Description [id number] Pressure ulcer of left heel, stage 3 I87.2 Venous insufficiency (chronic) (peripheral) L97.822 Non-pressure chronic ulcer of other part of left lower leg with fat layer exposed I10 Essential (primary) hypertension N18.30 Chronic kidney disease, stage 3 unspecified J44.9 Chronic obstructive pulmonary disease, unspecified G35 Multiple sclerosis Follow-up Appointments o Return Appointment in 2 weeks. [place]: - [facility] [place] for wound care. May utilize formulary equivalent dressing for wound treatment orders unless otherwise specified. Home Health Nurse may visit PRN to address patientos wound care needs. o Scheduled days for dressing changes to be completed; exception, patient has scheduled wound care visit that day. o **Please direct any NON-WOUND related issues/requests for orders to patient's Primary Care Physician. **If current dressing causes regression in wound condition, may D/C ordered dressing product/s and apply Normal Saline Moist Dressing daily until next [place] or Other MD appointment. **Notify [facility] of regression in wound condition at [phone]. Edema Control - Lymphedema / Segmental Compressive Device /  Other Bilateral Lower Extremities o Tubigrip single layer applied. - size D o Elevate, Exercise Daily and Avoid Standing for Long Periods of Time. o Elevate legs to the level of the heart and pump ankles as often as possible - ELEVATE LEGS o Elevate leg(s) parallel to the floor when sitting. Off-Loading Left Lower Extremity o Open toe surgical shoe o Turn and reposition every 2 hours o Other: - Keep pressure off left heel Medications-Please add to medication list. o P.O. Antibiotics - Pick up and start antibiotic according to directions for your heel Wound Treatment Wound #12 -  Calcaneus Wound Laterality: Left Cleanser: Soap and Water 2 x Per Week/30 Days Discharge Instructions: Gently cleanse wound with antibacterial soap, rinse and pat dry prior to dressing wounds Primary Dressing: Hydrofera Blue Ready Transfer Foam, 4x5 (in/in) (Generic) 2 x Per Week/30 Days Discharge Instructions: Apply to wound bed-cut hydrofera about 2 cm bigger around for absorption. Ensure hydrofera blue touches wound bed Secondary Dressing: Mepilex Border Flex, 4x4 (in/in) 2 x Per Week/30 Days Discharge Instructions: Apply to wound as directed. Do not cut. Electronic Signature(s) [name] [name], [name] [name] ([id number]) Signed: 02/02/2021 9:28:39 AM By: Worthy Keeler PA-C Signed: 02/02/2021 12:57:29 PM By: Carlene Coria RN Entered By: Carlene Coria on 02/01/2021 14:57:48 [name] [name], [name] [name] ([id number]) -------------------------------------------------------------------------------- Problem List Details Patient Name: [name] [name] Date of Service: 02/01/2021 2:15 PM Medical Record Number: [id number] Patient Account Number: [id number] Date of Birth/Sex: 05-30-1950 (70 y.o. F) Treating RN: Carlene Coria Primary Care Provider: [name] Other Clinician: Referring Provider: [name] Treating Provider/Extender: [name] [name] in Treatment: 28 Active Problems ICD-10 Encounter Code Description Active Date MDM Diagnosis L89.623 Pressure ulcer of left heel, stage 3 07/20/2020 No Yes I87.2 Venous insufficiency (chronic) (peripheral) 01/02/2021 No Yes L97.822 Non-pressure chronic ulcer of other part of left lower leg with fat layer 11/27/2020 No Yes exposed [place] (primary) hypertension 07/20/2020 No Yes N18.30 Chronic kidney disease, stage 3 unspecified 07/20/2020 No Yes J44.9 Chronic obstructive pulmonary disease, unspecified 07/20/2020 No Yes G35 Multiple sclerosis 07/20/2020 No Yes Inactive Problems Resolved Problems Electronic Signature(s) Signed: 02/01/2021 2:32:39 PM By: Worthy Keeler PA-C Entered By: Worthy Keeler on 02/01/2021 14:32:38 [name] [name], [name] [name] ([id number]) -------------------------------------------------------------------------------- Progress Note Details Patient Name: [name] [name] Date of Service: 02/01/2021 2:15 PM Medical Record Number: [id number] Patient Account Number: [id number] Date of Birth/Sex: 22-Oct-1950 (70 y.o. F) Treating RN: Carlene Coria Primary Care Provider: [name] Other Clinician: Referring Provider: [name] Treating Provider/Extender: [name] [name] in Treatment: 28 Subjective Chief Complaint Information obtained from Patient Left Heel Pressure Ulcer History of Present Illness (HPI) 70 year old with a past medical history significant for [name], urinary incontinence, and obesity. She has been seen in the wound clinic before for lower extremity ulcerations treated with compression therapy. she is also known to have hypertension, peripheral vascular disease, COPD, obstructive sleep apnea, lumbar radiculopathy, kyphoscoliosis, urinary issues and tobacco abuse. Smokes a packet of cigarettes a day was recently seen at the [facility] for swelling of her legs and feet with a ulceration on the dorsum of the right foot which has been there for about 6 months. she was recently in the ER about a month ago where she was seen for shortness of breath and swelling of the legs and a chest x-ray was within normal limits had an increase in her BNP and was given Lasix and put on doxycycline for a mild cellulitis and possible UTI. Wounds aresmaller today 11/13/15. Debrided  and will continue Santyl. 12/21/2015 -- she was admitted to the hospital overnight on 12/18/2015 and diagnosed with urinary retention and cellulitis of the left lower leg. is past to take clindamycin and use Santyl for her wounds. 01/15/2016 -- come back to see [place] for almost a month and continues to be noncompliant with her dressings 01/30/16  patient presents today for a follow-up visit concerning her ongoing bilateral lower extremity wounds. We have not seen her for the past 2 weeks although we are supposed to be seen her for weekly visits. She currently has a Foley catheter. She tells me that the wounds are intensely painful especially with pressure and palpation at this point in time. Fortunately she is having no interval signs or symptoms of systemic infection but unfortunately the wounds have not improved dramatically since we last saw her. She does have home health coming to take care of her as well. She is currently not in any compression wraps. 02/06/16 ON evaluation today patient continues to experience discomfort regarding her bilateral lower extremity ulcers. She has continued to tolerate the dressing changes at this point in time and continues to have a Foley catheter as well. Fortunately she is back this week in the past she has been somewhat noncompliant with follow-ups I'm glad to see her today. She tells me that her pain level varies but can be as high as a 7 out of 10 with manipulation of the wound. She tells me that she used to be on oxycodone which was managed by the pain clinic although she is no longer on that as she tells me that she was actually smoking marijuana at the time and when this was found out they discontinued her pain medication. She no longer is taking anything pain medication wise and she also does not smoke cigarettes nor marijuana at this point. 02/12/16; this is a patient I don't believe I have previously seen. She has multiple sclerosis. She has 4 punched-out areas on the anterior lateral left leg 2 areas on the right these are all in the same condition. Reasonably small [dime size] wounds each was some depth. Surface of these wounds does not look particularly healthy as there is adherent slough. There is no evidence of surrounding infection or inflammation. ABIs in this clinic were 0.87 on the right  and 0.81 on the left. She is listed as having PAD and is a smoker. Not a diabetic 02/20/16 patient I gave antibiotics to last week for erythema around both wound sites on the left lateral leg and right lateral leg. This appears to be a lot better. One of the areas on the left leg is healed however she still has 3 punched-out open areas on the left lateral calf and one on the right lateral calf and one on the dorsal foot. She is an ex-smoker quitting 1 month ago. ABIs in this clinic were 0.87 on the right 0.81 on the left 03/12/16; this is a patient I really don't have a good sense of. It would appear for the first 5 or 6 weeks of her stay in this clinic she was cared for by Dr. Con Memos. She appeared on my schedule in mid October and I saw her twice. She has not been here however in 3 weeks. She has advanced Wound Care at home where she lives with her husband. She has multiple sclerosis. I saw her the first time she had 4 punched-out small wounds on the anterior lateral left leg it appears that she  is now down to 2. She also had wounds on the lateral and medial aspect of her right leg which were also small and punched out. The only one that remains is on the right lateral. Because of the nature of her wound I went ahead and ordered formal arterial studies this showed an ABI of 1 on the right and one on the left. TBI of 1.4 on the right and 0.79 on the left. She had normal waveforms. She was in the emergency room on 02/28/16; there is a noted edema of her left leg after a fall. She had a duplex ultrasound that showed no evidence for an acute DVT from the left groin to the popliteal fossa. The study was limited in the calf veins due to edema. Also noticeable that she had a left inguinal enlarged lymph node up to 6.2 cm. She also had a left knee x-ray that showed no acute findings and a right foot x-ray that showed soft tissue swelling but no radiographic evidence of osteomyelitis. The patient does stop  smoking 03-19-16 [name] [name] presents today for evaluation of her bilateral lower extremity ulcerations. She states that she has not smoked in several weeks. She denies any pain or discomfort to bilateral lower extremities, tolerating compression therapy as ordered. 03/26/16; I have not seen this patient in 2 weeks however she has done very well with improvements in the areas in question. After we obtain normal arterial studies, increasing the 4-layer compression really seems to look done a nice job here. She has one open area on the left lateral leg and one on the medial right leg. These have filled in nicely and are now superficial wounds that showed epithelialized. I note the left inguinal lymph node at 6.2 cm. I may need to refer this back to the patient's primary doctor. 04/03/16; patient has 2 remaining wounds 1 on the left lateral leg and one on the right medial leg. Both of filled in nicely since we are able to increase her from a 3 layer to a 4 layer compression. I am also following up on the lymph node notable on her left leg DVT rule out in November 04/10/16; area on the right lateral leg is healed. The area on the left leg is still open but looks considerably better with healthy granulation and less wound area. I12/20/17; last week we healed the patient doubt with regards to the wound on the right leg to her on compression stocking 20-30 mmHg as it turns out I don't think she had a stocking, predictably therefore she has reopened. The left leg as well as the wound on the lateral aspect. Been generally small line 04/24/16; we healed out her right leg 2 weeks ago to her own 20-30 mm compression stockings although as it turns out she didn't have these and did not purchase some apparently because of financial issues. The left leg wound is on the lateral aspect. Both of these wounds are just about healed. 05/01/16; her wounds on the left leg are healed out today. The area on the right leg is also  healed. Vitamin diligent effort of our staff we have not been able to get any form of compression stocking to this patient. The orders for juxtalite's were given to home health this never materialized. We [place], Su H. ([id number]) ordered compression stockings I don't think she was able to afford these. We had a discussion today with both the patient and her husband without these, will accumulate edema and the  wounds will simply reopen again. 05/14/16 READMISSION this is a patient we healed out 2 weeks ago. As bilateral lower extremity venous wounds probably some degree of lymphedema. At the beginning in November I did a duplex ultrasound of her left leg that was negative for DVT but did show a lymph node in the left inguinal area presumably reactive. We've been using compression to her lower extremities eventually healed all her wounds on her bilateral calves but we were not able to get any form of compression stockings for her in spite of intense efforts of our staff. She returns today with reopening on the left leg did not the right she has several superficial areas anteriorly but an actual frank ulcer on the lateral left calf. This is about the size of a dime or smaller. She does not really complain of pain. The patient has a history of PAD however arterial studies we did in November were normal. Her ABIs and Doppler waveforms were both within the normal limits. The patient has a history of [name]. In discussing things with her today it would appear that the opening was noted 2 weeks ago according to the patient. We gave her Tubigrip stockings when she left the clinic 05/21/16; she has not yet had the duplex ultrasound of her thigh, this is booked for Thursday. The wound on the left lateral lower leg is improved 05/28/16; her duplex ultrasound of the left leg specifically her left thigh did not show a DVT however did show hypoechogenic enlarged left inguinal lymph nodes up to 6.9 x 3.7 x 6.8. This  also showed in the one in November however maximal diameter was 6.2, at that point I thought these were reactive secondary to cellulitis however there is obviously a differential here. Her wound on the left lower leg is closed. She has a superficial open area on the base of her left fifth metatarsal head on the plantar foot. It looks as though she has some wrap injuries on the anterior leg 06/05/16; since she was last year she had a fall on 1/31. She was seen in the ER but sent back home. I think she is also been seen by her primary physician who picked up on the swelling and the lymphadenopathy. She is ordered a CT scan of the abdomen and pelvis however this will be without contrast because of stage IIIB renal insufficiency. Nevertheless this should be a start the workup to make sure there is not is more systemic problem here. She will probably need consideration of a biopsy of the lymph node in her inguinal area, this would need general surgery. As far as her wounds are concern today the area on the left plantar foot is healed. She has a small weeping area on the left lower leg. The wrap injury from last week has largely Pardoxically there is a lot less edema in the left thigh. 06/12/16; CT scan is on Friday. She has 2 small open areas one on the lateral left lower leg and one on the anterior lower leg on the left. Both of these looks healthy. I reduced her compression last week to Kerlix and Coban this seems to have not a reasonable job 07/02/16; the patient is now in the skilled facility. I think this was arranged out of the ER when she fell at home although I'm not exactly sure. She is going for I think a laparotomy for the pelvic mass next week. All the wounds in the left leg have healed Readmission: 07/20/2020  upon evaluation today patient presents for reevaluation here in clinic unfortunately she is having issues with a significant stage III pressure ulcer to her left heel. This is something that  occurred when she was in the hospital sick. And subsequently though she does not appear to have any new injury she is still trying to recover from the pressure injury which was quite severe during that time. I do not see any signs of infection right now which is great news. With that being said she has been require some debridement to try to clear this up as much as possible. We need to get the wound VAC cleared so that she can see overall healing and improvement that were looking for. The patient does have a history of hypertension, chronic kidney disease stage III, COPD, and multiple sclerosis. Currently she has been trying to do what she can to keep pressure off of her heel while in bed and in other locations as well. 08/04/20 upon evaluation today patient appears to be doing extremely well currently in regard to her wound on the heel. I do believe the Iodoflex has helped loosen things appear. Unfortunately she tells me that the home health nurse has not been putting the Iodoflex on the wound. That is news to [place] and that they tell me they got supplies in the mail but they did not see the Iodoflex being used. Nonetheless I am wondering if this is just something that they missed or if it truly has not been. I did tell them to look when they get home in their box of supplies to see if they saw the dressing which showed and what it should look like. 08/11/2020 upon evaluation today patient appears to be doing decently well in regard to her wound although unfortunately this is showing signs of some blue-green drainage consistent with Pseudomonas it does have a little worried in this regard. Fortunately there is no evidence of active infection which is great news. No fevers, chills, nausea, vomiting, or diarrhea. 08/18/2020 upon evaluation today patient appears to be doing well with regard to her wound. She has been tolerating the dressing changes without complication. Fortunately there is no signs of active  infection at this time. No fevers, chills, nausea, vomiting, or diarrhea. 4/29; left heel wound pressure ulcer. Slightly smaller. 09/04/2020 upon evaluation today patient appears to be doing well with regard to her wound. This is actually on her heel and seems to be doing well other than the fact that is a little bit moist from excessive drainage. There does not appear to be any signs of active infection at this time. No fevers, chills, nausea, vomiting, or diarrhea.. 09/11/2020 upon evaluation today patient appears to be doing okay in regard to her wound. With that being said she still has quite a bit of drainage noted at this point. Fortunately there is no signs of active infection which is great news overall I am extremely pleased with where things stand today. I do think we need to try to do something a little bit more to help control the drainage a bit better. 5/23; left posterior heel. This actually looks quite good. Using Hydrofera Blue under compression. Dimensions are smaller 09/26/2020 upon evaluation today patient appears to be doing well with regard to her heel ulcer. In fact this is showing signs of excellent improvement and overall I am extremely pleased with where things stand today. I do not see any evidence of infection which is great news and overall I  feel like she is headed in the correct direction. 10/09/2020 upon evaluation today patient appears to be doing excellent in regard to her wound. She has been tolerating the dressing changes without complication. Fortunately there is no evidence of active infection which is also excellent news. No fevers, chills, nausea, vomiting, or diarrhea. 10/16/2020 upon evaluation today patient appears to be doing well with regard to her heel ulcer. Unfortunately she is having issues with an area of deep tissue injury/pressure injury over the shin due to the wrap being on way too tight today. Fortunately there does not appear to be any signs of  infection currently. No fevers, chills, nausea, vomiting, or diarrhea. 10/23/2020 upon evaluation today patient appears to be doing better in regard to her wound. Fortunately there does not appear to be any signs of active infection at this time which is great news. In general I am extremely pleased with where things stand at this point. The patient does not show any signs of worsening which is excellent. She unfortunately still has the area in question with regard to the region over the anterior portion of her shin where she did have some deep tissue injury. Some of this is starting to open up a little bit. With that being said I think the biggest thing we need to do at this point is to try to as much as possible monitor for any additional breakdown and protect the region while it heals up. I think it is actually doing quite well which is great news. Fortunately there does not appear to be any evidence of active infection [name] [name], [name] [name]. ([id number]) 11/14/2018 upon evaluation today patient appears to be doing okay in regard to her ischial. Her leg is much more swollen though she has not had a wrap on any type of dressing for some time when home health came out they did not have the Tubigrip. We have not actually seen her since June 27. The patient when I asked her about it seemed to get somewhat upset to be honest. She seems to be having some trouble at home. She states that she had to "raise hell" just to be able to get here to this appointment today. With that being said I really do think she needs to have this wraps and keep the swelling under control and was doing a whole lot better but now seems to be not doing quite as well to be honest. The anterior portion of her shin does seem to be healing and doing better although it did end up opening into the wound. Again that was where home health the wrap does have a little bit too tight causing this issue previously. 11/27/2020 upon evaluation today  patient appears to be doing poorly in regard to her anterior shin where she did have some area where the wrap actually aggravated the spot. With that being said I think that we do need to make sure to pad this extremely well there is already some signs of healing at this point. I did believe this was probably can open even when I saw it last week and to be honest it has opened. The heel also is showing signs of improvement I do see some slough noted but nothing too significant which is great news. No fevers, chills, nausea, vomiting, or diarrhea. 12/07/2020 upon evaluation today patient appears to be doing decently well in regard to her wounds. Both are showing signs of some slough and biofilm buildup on the surface of the  wounds. Fortunately there is no signs of active infection at this time which is great news. No fevers, chills, nausea, vomiting, or diarrhea. 12/15/2020 upon evaluation today patient appears to be doing well with regard to her wounds. She is showing signs of improvement at both locations which is great news. Fortunately I do not see any evidence of active infection at this time which is great as well. 12/25/2020 upon evaluation today patient appears to be doing well with regard to her wound on the posterior heel. This seems to be making some progress which is great news. Unfortunately the biggest issue is that of the shin which again just does not seem to be healing as effectively and quickly as I would like to see. Fortunately there does not appear to be any evidence of active infection which is great news I do believe that we can try to do something a little different using Tubigrip instead of the compression wrapping to see if this will keep pressure off of the shin allow this to heal more effectively and quickly. 01/02/2021 upon evaluation today patient's wound on the shin actually appears to be making excellent progress which is great news and overall extremely pleased with where  things stand today. There is no sign of active infection at this time. No fevers, chills, nausea, vomiting, or diarrhea. Her heel is going require some debridement but overall does not appear to be doing too poorly. 01/09/2021 upon evaluation today patient appears to be doing better in general in regard to her wounds overall. The anterior shin especially seems to be making good progress. I think we are getting close to resolution here. The heel is a little bit more concerning and that it measures a little larger. There is also some erythema surrounding. I think that we probably need to see about doing something to address this I think a culture is probably appropriate as well. 9/26; patient has 2 wound areas 1 on the left anterior shin and the other on the left Achilles heel. Using Hydrofera Blue to both of these. The patient had a culture done last time that showed Pseudomonas from the left Achilles heel. I am not certain whether she received antibiotics 02/01/2021 upon evaluation today patient appears to be doing well with regard to her wound on the shin this is completely healed. In regard to the wound on her heel this is showing signs of improvement which is good news fortunately there is no evidence of active infection which is great and overall I think you are making progress here since the beginning although it has somewhat plateaued a little bit more recently. Objective Constitutional Well-nourished and well-hydrated in no acute distress. Vitals Time Taken: 2:32 PM, Height: 64 in, Weight: 265 lbs, BMI: 45.5, Temperature: 98.4 F, Pulse: 88 bpm, Respiratory Rate: 18 breaths/min, Blood Pressure: 108/69 mmHg. Respiratory normal breathing without difficulty. Psychiatric this patient is able to make decisions and demonstrates good insight into disease process. Alert and Oriented x 3. pleasant and cooperative. General Notes: Upon inspection patient's wound bed showed signs of good granulation  epithelization at this point. Fortunately there does not appear to be any signs of active infection which is great news and overall very pleased with where things stand. No fevers, chills, nausea, vomiting, or diarrhea. There was some slough on the surface of the wound this is easily cleaned away with saline and gauze no sharp debridement was even necessary. Integumentary (Hair, Skin) Wound #12 status is Open. Original cause of wound  was Pressure Injury. The date acquired was: 06/27/2020. The wound has been in treatment 28 weeks. The wound is located on the Left Calcaneus. The wound measures 1.7cm length x 2cm width x 0.2cm depth; 2.67cm^2 area and 0.534cm^3 volume. There is Fat Layer (Subcutaneous Tissue) exposed. There is no tunneling or undermining noted. There is a medium amount of serous drainage noted. The wound margin is thickened. There is large (67-100%) red, pink granulation within the wound bed. There is a small (1-33%) amount of necrotic tissue within the wound bed including Adherent Slough. [name] [name], [name] [name] ([id number]) Wound #13 status is Healed - Epithelialized. Original cause of wound was Shear/Friction. The date acquired was: 11/24/2020. The wound has been in treatment 9 weeks. The wound is located on the Left,Midline,Anterior Lower Leg. The wound measures 0cm length x 0cm width x 0cm depth; 0cm^2 area and 0cm^3 volume. There is no tunneling or undermining noted. There is a none present amount of drainage noted. The wound margin is indistinct and nonvisible. There is no granulation within the wound bed. There is no necrotic tissue within the wound bed. Assessment Active Problems ICD-10 Pressure ulcer of left heel, stage 3 Venous insufficiency (chronic) (peripheral) Non-pressure chronic ulcer of other part of left lower leg with fat layer exposed Essential (primary) hypertension Chronic kidney disease, stage 3 unspecified Chronic obstructive pulmonary disease, unspecified Multiple  sclerosis Plan 1. Would recommend currently that we going continue with the wound care measures as before and the patient is in agreement with plan this includes the use of the [facility] for the heel. 2. Also can recommend that we continue with the border foam dressing to cover I think this is doing a good job we will continue with 1 over the shin as well for protection. 3. I would also recommend that we have the patient continue to monitor for any signs of worsening or infection if anything changes she should let me know. We will see patient back for reevaluation in 2 weeks here in the clinic. If anything worsens or changes patient will contact our office for additional recommendations. Electronic Signature(s) Signed: 02/01/2021 2:55:55 PM By: Worthy Keeler PA-C Entered By: Worthy Keeler on 02/01/2021 14:55:55 [name] [name] ([id number]) -------------------------------------------------------------------------------- SuperBill Details Patient Name: [name] [name] Date of Service: 02/01/2021 Medical Record Number: [id number] Patient Account Number: [id number] Date of Birth/Sex: 10/12/1950 (70 y.o. F) Treating RN: Carlene Coria Primary Care Provider: [name] Other Clinician: Referring Provider: [name] Treating Provider/Extender: [name] [name] in Treatment: 28 Diagnosis Coding ICD-10 Codes Code Description [phone] Pressure ulcer of left heel, stage 3 I87.2 Venous insufficiency (chronic) (peripheral) L97.822 Non-pressure chronic ulcer of other part of left lower leg with fat layer exposed [place] (primary) hypertension N18.30 Chronic kidney disease, stage 3 unspecified J44.9 Chronic obstructive pulmonary disease, unspecified G35 Multiple sclerosis Facility Procedures CPT4 Code: [id number] Description: 99213 - WOUND CARE VISIT-LEV 3 EST PT Modifier: Quantity: 1 Physician Procedures CPT4 Code: 3382505 Description: 39767 - WC PHYS LEVEL 3 - EST  PT Modifier: Quantity: 1 CPT4 Code: Description: ICD-10 Diagnosis Description L89.623 Pressure ulcer of left heel, stage 3 I87.2 Venous insufficiency (chronic) (peripheral) L97.822 Non-pressure chronic ulcer of other part of left lower leg with fat lay [place] (primary) hypertension Modifier: er exposed Quantity: Electronic Signature(s) Signed: 02/02/2021 9:28:39 AM By: Worthy Keeler PA-C Signed: 02/02/2021 12:57:29 PM By: Carlene Coria RN Previous Signature: 02/01/2021 2:56:08 PM Version By: Worthy Keeler PA-C Entered By: Carlene Coria on 02/01/2021  15:00:18 

## 2021-02-02 NOTE — Progress Notes (Signed)
DAISY, LITES ([id number]) Visit Report for 02/01/2021 Arrival Information Details Patient Name: JERMANY, SUNDELL Date of Service: 02/01/2021 2:15 PM Medical Record Number: [id number] Patient Account Number: [id number] Date of Birth/Sex: [birth year] (70 y.o. F) Treating RN: Carlene Coria Primary Care Demiyah Fischbach: [name] Other Clinician: Referring Makeila Yamaguchi: [name] Treating Vaibhav Fogleman/Extender: [name] in Treatment: 28 Visit Information History Since Last Visit All ordered tests and consults were completed: No Patient Arrived: Wheel Chair Added or deleted any medications: No Arrival Time: 14:27 Any new allergies or adverse reactions: No Accompanied By: son Had a fall or experienced change in No Transfer Assistance: None activities of daily living that may affect Patient Identification Verified: Yes risk of falls: Secondary Verification Process Completed: Yes Signs or symptoms of abuse/neglect since last visito No Patient Requires Transmission-Based Precautions: No Hospitalized since last visit: No Patient Has Alerts: Yes Implantable device outside of the clinic excluding No cellular tissue based products placed in the center since last visit: Has Dressing in Place as Prescribed: Yes Pain Present Now: No Electronic Signature(s) Signed: 02/02/2021 12:57:29 PM By: Carlene Coria RN Entered By: Carlene Coria on 02/01/2021 14:32:33 Bogle, [name] ([id number]) -------------------------------------------------------------------------------- Clinic Level of Care Assessment Details Patient Name: [name] Date of Service: 02/01/2021 2:15 PM Medical Record Number: [id number] Patient Account Number: [id number] Date of Birth/Sex: Oct 31, 1950 (70 y.o. F) Treating RN: Carlene Coria Primary Care Ciearra Rufo: [name] Other Clinician: Referring Pegi Milazzo: [name] Treating Saahil Herbster/Extender: [name] in Treatment: 28 Clinic Level of Care  Assessment Items TOOL 4 Quantity Score X - Use when only an EandM is performed on FOLLOW-UP visit 1 0 ASSESSMENTS - Nursing Assessment / Reassessment X - Reassessment of Co-morbidities (includes updates in patient status) 1 10 X- 1 5 Reassessment of Adherence to Treatment Plan ASSESSMENTS - Wound and Skin Assessment / Reassessment X - Simple Wound Assessment / Reassessment - one wound 1 5 []  - 0 Complex Wound Assessment / Reassessment - multiple wounds []  - 0 Dermatologic / Skin Assessment (not related to wound area) ASSESSMENTS - Focused Assessment []  - Circumferential Edema Measurements - multi extremities 0 []  - 0 Nutritional Assessment / Counseling / Intervention []  - 0 Lower Extremity Assessment (monofilament, tuning fork, pulses) []  - 0 Peripheral Arterial Disease Assessment (using hand held doppler) ASSESSMENTS - Ostomy and/or Continence Assessment and Care []  - Incontinence Assessment and Management 0 []  - 0 Ostomy Care Assessment and Management (repouching, etc.) PROCESS - Coordination of Care X - Simple Patient / Family Education for ongoing care 1 15 []  - 0 Complex (extensive) Patient / Family Education for ongoing care X- 1 10 Staff obtains Programmer, systems, Records, Test Results / Process Orders []  - 0 Staff telephones HHA, Nursing Homes / Clarify orders / etc []  - 0 Routine Transfer to another Facility (non-emergent condition) []  - 0 Routine Hospital Admission (non-emergent condition) []  - 0 New Admissions / Biomedical engineer / Ordering NPWT, Apligraf, etc. []  - 0 Emergency Hospital Admission (emergent condition) X- 1 10 Simple Discharge Coordination []  - 0 Complex (extensive) Discharge Coordination PROCESS - Special Needs []  - Pediatric / Minor Patient Management 0 []  - 0 Isolation Patient Management []  - 0 Hearing / Language / Visual special needs []  - 0 Assessment of Community assistance (transportation, D/C planning, etc.) []  - 0 Additional  assistance / Altered mentation []  - 0 Support Surface(s) Assessment (bed, cushion, seat, etc.) INTERVENTIONS - Wound Cleansing / Measurement Meyerhoff, Takyah H. ([id number]) X- 1 5 Simple Wound Cleansing -  one wound []  - 0 Complex Wound Cleansing - multiple wounds X- 1 5 Wound Imaging (photographs - any number of wounds) []  - 0 Wound Tracing (instead of photographs) X- 1 5 Simple Wound Measurement - one wound []  - 0 Complex Wound Measurement - multiple wounds INTERVENTIONS - Wound Dressings X - Small Wound Dressing one or multiple wounds 1 10 []  - 0 Medium Wound Dressing one or multiple wounds []  - 0 Large Wound Dressing one or multiple wounds []  - 0 Application of Medications - topical []  - 0 Application of Medications - injection INTERVENTIONS - Miscellaneous []  - External ear exam 0 []  - 0 Specimen Collection (cultures, biopsies, blood, body fluids, etc.) []  - 0 Specimen(s) / Culture(s) sent or taken to Lab for analysis []  - 0 Patient Transfer (multiple staff / Civil Service fast streamer / Similar devices) []  - 0 Simple Staple / Suture removal (25 or less) []  - 0 Complex Staple / Suture removal (26 or more) []  - 0 Hypo / Hyperglycemic Management (close monitor of Blood Glucose) []  - 0 Ankle / Brachial Index (ABI) - do not check if billed separately X- 1 5 Vital Signs Has the patient been seen at the hospital within the last three years: Yes Total Score: 85 Level Of Care: New/Established - Level 3 Electronic Signature(s) Signed: 02/02/2021 12:57:29 PM By: Carlene Coria RN Entered By: Carlene Coria on 02/01/2021 15:00:07 [name] ([id number]) -------------------------------------------------------------------------------- Encounter Discharge Information Details Patient Name: [name] Date of Service: 02/01/2021 2:15 PM Medical Record Number: [id number] Patient Account Number: [id number] Date of Birth/Sex: [birth year] (70 y.o. F) Treating RN: Carlene Coria Primary Care  Chloie Loney: [name] Other Clinician: Referring Eriyonna Matsushita: [name] Treating Lisett Dirusso/Extender: [name] in Treatment: 28 Encounter Discharge Information Items Discharge Condition: Stable Ambulatory Status: Wheelchair Discharge Destination: Home Transportation: Private Auto Accompanied By: husband Schedule Follow-up Appointment: Yes Clinical Summary of Care: Patient Declined Electronic Signature(s) Signed: 02/02/2021 12:57:29 PM By: Carlene Coria RN Entered By: Carlene Coria on 02/01/2021 15:01:09 [name] ([id number]) -------------------------------------------------------------------------------- Lower Extremity Assessment Details Patient Name: [name] Date of Service: 02/01/2021 2:15 PM Medical Record Number: [id number] Patient Account Number: [id number] Date of Birth/Sex: 1951-01-18 (70 y.o. F) Treating RN: Carlene Coria Primary Care Deloria Brassfield: [name] Other Clinician: Referring Geovani Tootle: [name] Treating Shakeerah Gradel/Extender: [name] in Treatment: 28 Edema Assessment Assessed: [Left: No] [Right: No] [Left: Edema] [Right: :] Calf Left: Right: Point of Measurement: 29 cm From Medial Instep 39 cm Ankle Left: Right: Point of Measurement: 10 cm From Medial Instep 21 cm Vascular Assessment Pulses: Dorsalis Pedis Palpable: [Left:Yes] Electronic Signature(s) Signed: 02/02/2021 12:57:29 PM By: Carlene Coria RN Entered By: Carlene Coria on 02/01/2021 14:40:55 Konkle, [name] ([id number]) -------------------------------------------------------------------------------- Multi Wound Chart Details Patient Name: [name] Date of Service: 02/01/2021 2:15 PM Medical Record Number: [id number] Patient Account Number: [id number] Date of Birth/Sex: [birth year] (70 y.o. F) Treating RN: Carlene Coria Primary Care Rhydian Baldi: [name] Other Clinician: Referring Maimuna Leaman: [name] Treating Aysha Livecchi/Extender:  [name] in Treatment: 28 Vital Signs Height(in): 19 Pulse(bpm): 94 Weight(lbs): 265 Blood Pressure(mmHg): 108/69 Body Mass Index(BMI): 45 Temperature(F): 98.4 Respiratory Rate(breaths/min): 18 Photos: [N/A:N/A] Wound Location: Left Calcaneus Left, Midline, Anterior Lower Leg N/A Wounding Event: Pressure Injury Shear/Friction N/A Primary Etiology: Pressure Ulcer Trauma, Other N/A Comorbid History: Cataracts, Chronic Obstructive Cataracts, Chronic Obstructive N/A Pulmonary Disease (COPD), Sleep Pulmonary Disease (COPD), Sleep Apnea, Hypertension, Apnea, Hypertension, Osteoarthritis, Neuropathy Osteoarthritis, Neuropathy Date Acquired: 06/27/2020 11/24/2020 N/A Weeks of  Treatment: 28 9 N/A Wound Status: Open Healed - Epithelialized N/A Measurements L x W x D (cm) 1.7x2x0.2 0x0x0 N/A Area (cm) : 2.67 0 N/A Volume (cm) : 0.534 0 N/A % Reduction in Area: 47.70% 100.00% N/A % Reduction in Volume: 47.70% 100.00% N/A Classification: Category/Stage III Partial Thickness N/A Exudate Amount: Medium None Present N/A Exudate Type: Serous N/A N/A Exudate Color: amber N/A N/A Wound Margin: Thickened Indistinct, nonvisible N/A Granulation Amount: Large (67-100%) None Present (0%) N/A Granulation Quality: Red, Pink N/A N/A Necrotic Amount: Small (1-33%) None Present (0%) N/A Exposed Structures: Fat Layer (Subcutaneous Tissue): Fascia: No N/A Yes Fat Layer (Subcutaneous Tissue): Fascia: No No Tendon: No Tendon: No Muscle: No Muscle: No Joint: No Joint: No Bone: No Bone: No Epithelialization: Small (1-33%) Large (67-100%) N/A Treatment Notes Electronic Signature(s) Signed: 02/02/2021 12:57:29 PM By: Carlene Coria RN Entered By: Carlene Coria on 02/01/2021 14:52:00 [name] ([id number]) -------------------------------------------------------------------------------- Multi-Disciplinary Care Plan Details Patient Name: [name] Date of Service: 02/01/2021 2:15  PM Medical Record Number: [id number] Patient Account Number: [id number] Date of Birth/Sex: [birth year] (70 y.o. F) Treating RN: Carlene Coria Primary Care Jozelynn Danielson: [name] Other Clinician: Referring Troye Hiemstra: [name] Treating Reygan Heagle/Extender: [name] in Treatment: 28 Active Inactive Electronic Signature(s) Signed: 02/02/2021 12:57:29 PM By: Carlene Coria RN Entered By: Carlene Coria on 02/01/2021 14:51:48 [name] ([id number]) -------------------------------------------------------------------------------- Pain Assessment Details Patient Name: [name] Date of Service: 02/01/2021 2:15 PM Medical Record Number: [id number] Patient Account Number: [id number] Date of Birth/Sex: [birth year] (70 y.o. F) Treating RN: Carlene Coria Primary Care Oluwatosin Bracy: [name] Other Clinician: Referring Curtina Grills: [name] Treating Der Gagliano/Extender: [name] in Treatment: 28 Active Problems Location of Pain Severity and Description of Pain Patient Has Paino No Site Locations Pain Management and Medication Current Pain Management: Electronic Signature(s) Signed: 02/02/2021 12:57:29 PM By: Carlene Coria RN Entered By: Carlene Coria on 02/01/2021 14:33:12 [name] ([id number]) -------------------------------------------------------------------------------- Patient/Caregiver Education Details Patient Name: [name] Date of Service: 02/01/2021 2:15 PM Medical Record Number: [id number] Patient Account Number: [id number] Date of Birth/Gender: 1951-02-24 (69 y.o. F) Treating RN: Carlene Coria Primary Care Physician: [name] Other Clinician: Referring Physician: [name] Treating Physician/Extender: [name] in Treatment: 28 Education Assessment Education Provided To: Patient Education Topics Provided Wound/Skin Impairment: Methods: Explain/Verbal Responses: State content correctly Electronic  Signature(s) Signed: 02/02/2021 12:57:29 PM By: Carlene Coria RN Entered By: Carlene Coria on 02/01/2021 15:00:30 [name] ([id number]) -------------------------------------------------------------------------------- Wound Assessment Details Patient Name: [name] Date of Service: 02/01/2021 2:15 PM Medical Record Number: [id number] Patient Account Number: [id number] Date of Birth/Sex: 01/20/1951 (70 y.o. F) Treating RN: Carlene Coria Primary Care Roald Lukacs: [name] Other Clinician: Referring Shareka Casale: [name] Treating Tayna Smethurst/Extender: [name] in Treatment: 28 Wound Status Wound Number: 12 Primary Pressure Ulcer Etiology: Wound Location: Left Calcaneus Wound Open Wounding Event: Pressure Injury Status: Date Acquired: 06/27/2020 Comorbid Cataracts, Chronic Obstructive Pulmonary Disease Weeks Of Treatment: 28 History: (COPD), Sleep Apnea, Hypertension, Osteoarthritis, Clustered Wound: No Neuropathy Photos Wound Measurements Length: (cm) 1.7 Width: (cm) 2 Depth: (cm) 0.2 Area: (cm) 2.67 Volume: (cm) 0.534 % Reduction in Area: 47.7% % Reduction in Volume: 47.7% Epithelialization: Small (1-33%) Tunneling: No Undermining: No Wound Description Classification: Category/Stage III Wound Margin: Thickened Exudate Amount: Medium Exudate Type: Serous Exudate Color: amber Foul Odor After Cleansing: No Slough/Fibrino Yes Wound Bed Granulation Amount: Large (67-100%) Exposed Structure Granulation Quality: Red, Pink Fascia Exposed: No Necrotic Amount: Small (1-33%)  Fat Layer (Subcutaneous Tissue) Exposed: Yes Necrotic Quality: Adherent Slough Tendon Exposed: No Muscle Exposed: No Joint Exposed: No Bone Exposed: No Treatment Notes Wound #12 (Calcaneus) Wound Laterality: Left Cleanser Soap and Water Discharge Instruction: Gently cleanse wound with antibacterial soap, rinse and pat dry prior to dressing wounds Glasper, Coty H.  ([id number]) Peri-Wound Care Topical Primary Dressing Hydrofera Blue Ready Transfer Foam, 4x5 (in/in) Discharge Instruction: Apply to wound bed-cut hydrofera about 2 cm bigger around for absorption. Ensure hydrofera blue touches wound bed Secondary Dressing Mepilex Border Flex, 4x4 (in/in) Discharge Instruction: Apply to wound as directed. Do not cut. Secured With Compression Wrap Compression Stockings Add-Ons Electronic Signature(s) Signed: 02/02/2021 12:57:29 PM By: Carlene Coria RN Entered By: Carlene Coria on 02/01/2021 14:39:19 [name] ([id number]) -------------------------------------------------------------------------------- Wound Assessment Details Patient Name: [name] Date of Service: 02/01/2021 2:15 PM Medical Record Number: [id number] Patient Account Number: [id number] Date of Birth/Sex: 21-Sep-1950 (70 y.o. F) Treating RN: Carlene Coria Primary Care Lajune Perine: [name] Other Clinician: Referring Ereka Brau: [name] Treating Rasa Degrazia/Extender: [name] in Treatment: 28 Wound Status Wound Number: 13 Primary Trauma, Other Etiology: Wound Location: Left, Midline, Anterior Lower Leg Wound Healed - Epithelialized Wounding Event: Shear/Friction Status: Date Acquired: 11/24/2020 Comorbid Cataracts, Chronic Obstructive Pulmonary Disease Weeks Of Treatment: 9 History: (COPD), Sleep Apnea, Hypertension, Osteoarthritis, Clustered Wound: No Neuropathy Photos Wound Measurements Length: (cm) 0 Width: (cm) 0 Depth: (cm) 0 Area: (cm) 0 Volume: (cm) 0 % Reduction in Area: 100% % Reduction in Volume: 100% Epithelialization: Large (67-100%) Tunneling: No Undermining: No Wound Description Classification: Partial Thickness Wound Margin: Indistinct, nonvisible Exudate Amount: None Present Foul Odor After Cleansing: No Slough/Fibrino No Wound Bed Granulation Amount: None Present (0%) Exposed Structure Necrotic Amount: None Present  (0%) Fascia Exposed: No Fat Layer (Subcutaneous Tissue) Exposed: No Tendon Exposed: No Muscle Exposed: No Joint Exposed: No Bone Exposed: No Treatment Notes Wound #13 (Lower Leg) Wound Laterality: Left, Midline, Anterior Cleanser Peri-Wound Care Topical Primary Dressing SUHAILAH, KWAN ([id number]) Secondary Dressing Secured With Compression Wrap Compression Stockings Add-Ons Electronic Signature(s) Signed: 02/02/2021 12:57:29 PM By: Carlene Coria RN Entered By: Carlene Coria on 02/01/2021 14:50:38 [name] ([id number]) -------------------------------------------------------------------------------- Vitals Details Patient Name: [name] Date of Service: 02/01/2021 2:15 PM Medical Record Number: [id number] Patient Account Number: [id number] Date of Birth/Sex: 07/02/1950 (70 y.o. F) Treating RN: Carlene Coria Primary Care Evia Goldsmith: [name] Other Clinician: Referring Arlan Birks: [name] Treating Eris Hannan/Extender: [name] in Treatment: 28 Vital Signs Time Taken: 14:32 Temperature (F): 98.4 Height (in): 64 Pulse (bpm): 88 Weight (lbs): 265 Respiratory Rate (breaths/min): 18 Body Mass Index (BMI): 45.5 Blood Pressure (mmHg): 108/69 Reference Range: 80 - 120 mg / dl Electronic Signature(s) Signed: 02/02/2021 12:57:29 PM By: Carlene Coria RN Entered By: Carlene Coria on 02/01/2021 14:32:57

## 2021-02-15 ENCOUNTER — Encounter: Payer: Medicare Other | Admitting: Physician Assistant

## 2021-02-15 ENCOUNTER — Other Ambulatory Visit: Payer: Self-pay

## 2021-02-15 DIAGNOSIS — L89623 Pressure ulcer of left heel, stage 3: Secondary | ICD-10-CM | POA: Diagnosis not present

## 2021-02-15 NOTE — Progress Notes (Addendum)
[name], [name] ([id number]) Visit Report for 02/15/2021 Arrival Information Details Patient Name: [name], [name] Date of Service: 02/15/2021 2:00 PM Medical Record Number: [id number] Patient Account Number: [id number] Date of Birth/Sex: 05/01/1950 (70 y.o. F) Treating RN: [name] Primary Care Linas Stepter: [name] Other Clinician: Referring Madelein Mahadeo: [name] Treating Thurma Priego/Extender: [name] in Treatment: 83 Visit Information History Since Last Visit Pain Present Now: Yes Patient Arrived: Walker Arrival Time: 14:13 Accompanied By: self Transfer Assistance: Manual Patient Identification Verified: Yes Secondary Verification Process Completed: Yes Patient Requires Transmission-Based Precautions: No Patient Has Alerts: Yes Electronic Signature(s) Signed: 02/15/2021 4:40:38 PM By: [name] RN Entered By: [name] on 02/15/2021 14:13:18 [name] ([id number]) -------------------------------------------------------------------------------- Clinic Level of Care Assessment Details Patient Name: [name] Date of Service: 02/15/2021 2:00 PM Medical Record Number: [id number] Patient Account Number: [id number] Date of Birth/Sex: 07-Apr-1951 (70 y.o. F) Treating RN: Cornell Barman Primary Care Ellamay Fors: [name] Other Clinician: Referring Pinki Rottman: [name] Treating Corneluis Allston/Extender: [name] in Treatment: 30 Clinic Level of Care Assessment Items TOOL 4 Quantity Score []  - Use when only an EandM is performed on FOLLOW-UP visit 0 ASSESSMENTS - Nursing Assessment / Reassessment X - Reassessment of Co-morbidities (includes updates in patient status) 1 10 X- 1 5 Reassessment of Adherence to Treatment Plan ASSESSMENTS - Wound and Skin Assessment / Reassessment X - Simple Wound Assessment / Reassessment - one wound 1 5 []  - 0 Complex Wound Assessment / Reassessment - multiple wounds []  - 0 Dermatologic /  Skin Assessment (not related to wound area) ASSESSMENTS - Focused Assessment []  - Circumferential Edema Measurements - multi extremities 0 []  - 0 Nutritional Assessment / Counseling / Intervention X- 1 5 Lower Extremity Assessment (monofilament, tuning fork, pulses) []  - 0 Peripheral Arterial Disease Assessment (using hand held doppler) ASSESSMENTS - Ostomy and/or Continence Assessment and Care []  - Incontinence Assessment and Management 0 []  - 0 Ostomy Care Assessment and Management (repouching, etc.) PROCESS - Coordination of Care X - Simple Patient / Family Education for ongoing care 1 15 []  - 0 Complex (extensive) Patient / Family Education for ongoing care X- 1 10 Staff obtains Consents, Records, Test Results / Process Orders []  - 0 Staff telephones HHA, Nursing Homes / Clarify orders / etc []  - 0 Routine Transfer to another Facility (non-emergent condition) []  - 0 Routine Hospital Admission (non-emergent condition) X- 1 15 New Admissions / Biomedical engineer / Ordering NPWT, Apligraf, etc. []  - 0 Emergency Hospital Admission (emergent condition) X- 1 10 Simple Discharge Coordination []  - 0 Complex (extensive) Discharge Coordination PROCESS - Special Needs []  - Pediatric / Minor Patient Management 0 []  - 0 Isolation Patient Management []  - 0 Hearing / Language / Visual special needs []  - 0 Assessment of Community assistance (transportation, D/C planning, etc.) []  - 0 Additional assistance / Altered mentation []  - 0 Support Surface(s) Assessment (bed, cushion, seat, etc.) INTERVENTIONS - Wound Cleansing / Measurement Stawicki, Dystany H. ([id number]) X- 1 5 Simple Wound Cleansing - one wound []  - 0 Complex Wound Cleansing - multiple wounds X- 1 5 Wound Imaging (photographs - any number of wounds) []  - 0 Wound Tracing (instead of photographs) X- 1 5 Simple Wound Measurement - one wound []  - 0 Complex Wound Measurement - multiple wounds INTERVENTIONS -  Wound Dressings []  - Small Wound Dressing one or multiple wounds 0 X- 1 15 Medium Wound Dressing one or multiple wounds []  - 0 Large Wound Dressing one or  multiple wounds []  - 0 Application of Medications - topical []  - 0 Application of Medications - injection INTERVENTIONS - Miscellaneous []  - External ear exam 0 []  - 0 Specimen Collection (cultures, biopsies, blood, body fluids, etc.) []  - 0 Specimen(s) / Culture(s) sent or taken to Lab for analysis []  - 0 Patient Transfer (multiple staff / Civil Service fast streamer / Similar devices) []  - 0 Simple Staple / Suture removal (25 or less) []  - 0 Complex Staple / Suture removal (26 or more) []  - 0 Hypo / Hyperglycemic Management (close monitor of Blood Glucose) []  - 0 Ankle / Brachial Index (ABI) - do not check if billed separately X- 1 5 Vital Signs Has the patient been seen at the hospital within the last three years: Yes Total Score: 110 Level Of Care: New/Established - Level 3 Electronic Signature(s) Signed: 02/19/2021 4:30:36 PM By: Gretta Cool, BSN, RN, CWS, Kim RN, BSN Entered By: Gretta Cool, BSN, RN, CWS, Kim on 02/19/2021 13:03:16 [name] ([id number]) -------------------------------------------------------------------------------- Encounter Discharge Information Details Patient Name: [name] Date of Service: 02/15/2021 2:00 PM Medical Record Number: [id number] Patient Account Number: [id number] Date of Birth/Sex: Feb 22, 1951 (70 y.o. F) Treating RN: Cornell Barman Primary Care Alyene Predmore: [name] Other Clinician: Referring Terrel Nesheiwat: [name] Treating Lucas Exline/Extender: [name] in Treatment: 30 Encounter Discharge Information Items Discharge Condition: Stable Ambulatory Status: Walker Discharge Destination: Home Transportation: Private Auto Schedule Follow-up Appointment: Yes Clinical Summary of Care: Electronic Signature(s) Signed: 02/19/2021 1:04:23 PM By: Gretta Cool, BSN, RN, CWS, Kim RN,  BSN Entered By: Gretta Cool, BSN, RN, CWS, Kim on 02/19/2021 13:04:22 [name] ([id number]) -------------------------------------------------------------------------------- Lower Extremity Assessment Details Patient Name: [name] Date of Service: 02/15/2021 2:00 PM Medical Record Number: [id number] Patient Account Number: [id number] Date of Birth/Sex: [birth year] (70 y.o. F) Treating RN: [name] Primary Care Senora Lacson: [name] Other Clinician: Referring Ayham Word: [name] Treating Ambers Iyengar/Extender: [name] Weeks in Treatment: 30 Edema Assessment Assessed: [Left: Yes] [Right: No] Edema: [Left: Ye] [Right: s] Vascular Assessment Pulses: Dorsalis Pedis Palpable: [Left:Yes] Electronic Signature(s) Signed: 02/15/2021 4:40:38 PM By: [name] RN Entered By: [name] on 02/15/2021 14:22:42 [name] ([id number]) -------------------------------------------------------------------------------- Multi Wound Chart Details Patient Name: [name] Date of Service: 02/15/2021 2:00 PM Medical Record Number: [id number] Patient Account Number: [id number] Date of Birth/Sex: [birth year] (70 y.o. F) Treating RN: [name] Primary Care Alyda Megna: [name] Other Clinician: Referring Sehar Sedano: [name] Treating Tremell Reimers/Extender: [name] in Treatment: 30 Vital Signs Height(in): 26 Pulse(bpm): 22 Weight(lbs): 265 Blood Pressure(mmHg): 148/84 Body Mass Index(BMI): 45 Temperature(F): 98.4 Respiratory Rate(breaths/min): 18 Photos: [N/A:N/A] Wound Location: Left Calcaneus N/A N/A Wounding Event: Pressure Injury N/A N/A Primary Etiology: Pressure Ulcer N/A N/A Comorbid History: Cataracts, Chronic Obstructive N/A N/A Pulmonary Disease (COPD), Sleep Apnea, Hypertension, Osteoarthritis, Neuropathy Date Acquired: 06/27/2020 N/A N/A Weeks of Treatment: 30 N/A N/A Wound Status: Open N/A N/A Measurements L x W x  D (cm) 2x2.4x0.2 N/A N/A Area (cm) : 3.77 N/A N/A Volume (cm) : 0.754 N/A N/A % Reduction in Area: 26.20% N/A N/A % Reduction in Volume: 26.20% N/A N/A Classification: Category/Stage III N/A N/A Exudate Amount: Medium N/A N/A Exudate Type: Serous N/A N/A Exudate Color: amber N/A N/A Wound Margin: Thickened N/A N/A Granulation Amount: Large (67-100%) N/A N/A Granulation Quality: Red, Pink N/A N/A Necrotic Amount: Small (1-33%) N/A N/A Exposed Structures: Fat Layer (Subcutaneous Tissue): N/A N/A Yes Fascia: No Tendon: No Muscle: No Joint: No Bone: No Epithelialization: Small (1-33%) N/A N/A  Treatment Notes Electronic Signature(s) Signed: 02/15/2021 4:40:38 PM By: [name] RN Entered By: [name] on 02/15/2021 14:41:51 [name] ([id number]) -------------------------------------------------------------------------------- Multi-Disciplinary Care Plan Details Patient Name: [name] Date of Service: 02/15/2021 2:00 PM Medical Record Number: [id number] Patient Account Number: [id number] Date of Birth/Sex: 1951/03/23 (70 y.o. F) Treating RN: [name] Primary Care Edvardo Honse: [name] Other Clinician: Referring Jmarion Christiano: [name] Treating Araina Butrick/Extender: [name] in Treatment: 30 Active Inactive Electronic Signature(s) Signed: 02/15/2021 4:40:38 PM By: [name] RN Entered By: [name] on 02/15/2021 14:41:44 [name] ([id number]) -------------------------------------------------------------------------------- Pain Assessment Details Patient Name: [name] Date of Service: 02/15/2021 2:00 PM Medical Record Number: [id number] Patient Account Number: [id number] Date of Birth/Sex: July 03, 1950 (70 y.o. F) Treating RN: [name] Primary Care Fynley Chrystal: [name] Other Clinician: Referring Jerlene Rockers: [name] Treating Alaycia Eardley/Extender: [name] in Treatment: 30 Active  Problems Location of Pain Severity and Description of Pain Patient Has Paino Yes Site Locations Pain Location: Pain in Ulcers Rate the pain. Current Pain Level: 2 Pain Management and Medication Current Pain Management: Electronic Signature(s) Signed: 02/15/2021 4:40:38 PM By: [name] RN Entered By: [name] on 02/15/2021 14:14:31 [name] ([id number]) -------------------------------------------------------------------------------- Patient/Caregiver Education Details Patient Name: [name] Date of Service: 02/15/2021 2:00 PM Medical Record Number: [id number] Patient Account Number: [id number] Date of Birth/Gender: [birth year] (70 y.o. F) Treating RN: Cornell Barman Primary Care Physician: [name] Other Clinician: Referring Physician: [name] Treating Physician/Extender: [name] in Treatment: 30 Education Assessment Education Provided To: Patient Education Topics Provided Wound/Skin Impairment: Handouts: Caring for Your Ulcer Methods: Demonstration, Explain/Verbal Responses: State content correctly Electronic Signature(s) Signed: 02/19/2021 4:30:36 PM By: Gretta Cool, BSN, RN, CWS, Kim RN, BSN Entered By: Gretta Cool, BSN, RN, CWS, Kim on 02/19/2021 13:03:43 [name] ([id number]) -------------------------------------------------------------------------------- Wound Assessment Details Patient Name: [name] Date of Service: 02/15/2021 2:00 PM Medical Record Number: [id number] Patient Account Number: [id number] Date of Birth/Sex: Apr 15, 1951 (70 y.o. F) Treating RN: [name] Primary Care Resa Rinks: [name] Other Clinician: Referring Tiburcio Linder: [name] Treating Raha Tennison/Extender: [name] in Treatment: 30 Wound Status Wound Number: 12 Primary Pressure Ulcer Etiology: Wound Location: Left Calcaneus Wound Open Wounding Event: Pressure Injury Status: Date Acquired: 06/27/2020 Comorbid  Cataracts, Chronic Obstructive Pulmonary Disease Weeks Of Treatment: 30 History: (COPD), Sleep Apnea, Hypertension, Osteoarthritis, Clustered Wound: No Neuropathy Photos Wound Measurements Length: (cm) 2 Width: (cm) 2.4 Depth: (cm) 0.2 Area: (cm) 3.77 Volume: (cm) 0.754 % Reduction in Area: 26.2% % Reduction in Volume: 26.2% Epithelialization: Small (1-33%) Tunneling: No Undermining: No Wound Description Classification: Category/Stage III Wound Margin: Thickened Exudate Amount: Medium Exudate Type: Serous Exudate Color: amber Foul Odor After Cleansing: No Slough/Fibrino Yes Wound Bed Granulation Amount: Large (67-100%) Exposed Structure Granulation Quality: Red, Pink Fascia Exposed: No Necrotic Amount: Small (1-33%) Fat Layer (Subcutaneous Tissue) Exposed: Yes Necrotic Quality: Adherent Slough Tendon Exposed: No Muscle Exposed: No Joint Exposed: No Bone Exposed: No Treatment Notes Wound #12 (Calcaneus) Wound Laterality: Left Cleanser Soap and Water Discharge Instruction: Gently cleanse wound with antibacterial soap, rinse and pat dry prior to dressing wounds Linhardt, Elaysia H. ([id number]) Peri-Wound Care Topical Primary Dressing Endoform 2x2 (in/in) Discharge Instruction: Apply Endoform as directed Secondary Dressing Mepilex Border Flex, 4x4 (in/in) Discharge Instruction: Apply to wound as directed. Do not cut. Secured With Compression Wrap Compression Stockings Environmental education officer) Signed: 02/15/2021 4:40:38 PM By: [name] RN Entered By: [name] on 02/15/2021 14:19:16 [name], [name]  Lemmie [name] ([id number]) -------------------------------------------------------------------------------- Vitals Details Patient Name: [name], [name] Date of Service: 02/15/2021 2:00 PM Medical Record Number: [id number] Patient Account Number: [id number] Date of Birth/Sex: 06-12-1950 (70 y.o. F) Treating RN: [name] Primary Care Loi Rennaker: [name] Other Clinician: Referring Oaklyn Jakubek: [name] Treating Westlynn Fifer/Extender: [name] in Treatment: 30 Vital Signs Time Taken: 14:13 Temperature (F): 98.4 Height (in): 64 Pulse (bpm): 84 Weight (lbs): 265 Respiratory Rate (breaths/min): 18 Body Mass Index (BMI): 45.5 Blood Pressure (mmHg): 148/84 Reference Range: 80 - 120 mg / dl Electronic Signature(s) Signed: 02/15/2021 4:40:38 PM By: [name] RN Entered By: [name] on 02/15/2021 14:14:11

## 2021-02-15 NOTE — Progress Notes (Addendum)
MEKISHA, BITTEL ([id number]) Visit Report for 02/15/2021 Chief Complaint Document Details Patient Name: [name], [name] Date of Service: 02/15/2021 2:00 PM Medical Record Number: [id number] Patient Account Number: [id number] Date of Birth/Sex: 09-17-1950 (70 y.o. F) Treating RN: [name] Primary Care Provider: [name] Other Clinician: Referring Provider: [name] Treating Provider/Extender: [name] in Treatment: 30 Information Obtained from: Patient Chief Complaint Left Heel Pressure Ulcer Electronic Signature(s) Signed: 02/15/2021 2:34:54 PM By: Worthy Keeler PA-C Entered By: Worthy Keeler on 02/15/2021 14:34:54 [name] ([id number]) -------------------------------------------------------------------------------- HPI Details Patient Name: [name] Date of Service: 02/15/2021 2:00 PM Medical Record Number: [id number] Patient Account Number: [id number] Date of Birth/Sex: 07/17/1950 (70 y.o. F) Treating RN: [name] Primary Care Provider: [name] Other Clinician: Referring Provider: [name] Treating Provider/Extender: [name] in Treatment: 71 History of Present Illness HPI Description: 70 year old with a past medical history significant for MS, urinary incontinence, and obesity. She has been seen in the wound clinic before for lower extremity ulcerations treated with compression therapy. she is also known to have hypertension, peripheral vascular disease, COPD, obstructive sleep apnea, lumbar radiculopathy, kyphoscoliosis, urinary issues and tobacco abuse. Smokes a packet of cigarettes a day was recently seen at the [facility] for swelling of her legs and feet with a ulceration on the dorsum of the right foot which has been there for about 6 months. she was recently in the ER about a month ago where she was seen for shortness of breath and swelling of the legs and a chest x-ray was  within normal limits had an increase in her BNP and was given Lasix and put on doxycycline for a mild cellulitis and possible UTI. Wounds aresmaller today 11/13/15. Debrided and will continue Santyl. 12/21/2015 -- she was admitted to the hospital overnight on 12/18/2015 and diagnosed with urinary retention and cellulitis of the left lower leg. is past to take clindamycin and use Santyl for her wounds. 01/15/2016 -- come back to see [place] for almost a month and continues to be noncompliant with her dressings 01/30/16 patient presents today for a follow-up visit concerning her ongoing bilateral lower extremity wounds. We have not seen her for the past 2 weeks although we are supposed to be seen her for weekly visits. She currently has a Foley catheter. She tells me that the wounds are intensely painful especially with pressure and palpation at this point in time. Fortunately she is having no interval signs or symptoms of systemic infection but unfortunately the wounds have not improved dramatically since we last saw her. She does have home health coming to take care of her as well. She is currently not in any compression wraps. 02/06/16 ON evaluation today patient continues to experience discomfort regarding her bilateral lower extremity ulcers. She has continued to tolerate the dressing changes at this point in time and continues to have a Foley catheter as well. Fortunately she is back this week in the past she has been somewhat noncompliant with follow-ups I'm glad to see her today. She tells me that her pain level varies but can be as high as a 7 out of 10 with manipulation of the wound. She tells me that she used to be on oxycodone which was managed by the pain clinic although she is no longer on that as she tells me that she was actually smoking marijuana at the time and when this was found out they discontinued her pain medication. She no longer  is taking anything pain medication wise and she also  does not smoke cigarettes nor marijuana at this point. 02/12/16; this is a patient I don't believe I have previously seen. She has multiple sclerosis. She has 4 punched-out areas on the anterior lateral left leg 2 areas on the right these are all in the same condition. Reasonably small [dime size] wounds each was some depth. Surface of these wounds does not look particularly healthy as there is adherent slough. There is no evidence of surrounding infection or inflammation. ABIs in this clinic were 0.87 on the right and 0.81 on the left. She is listed as having PAD and is a smoker. Not a diabetic 02/20/16 patient I gave antibiotics to last week for erythema around both wound sites on the left lateral leg and right lateral leg. This appears to be a lot better. One of the areas on the left leg is healed however she still has 3 punched-out open areas on the left lateral calf and one on the right lateral calf and one on the dorsal foot. She is an ex-smoker quitting 1 month ago. ABIs in this clinic were 0.87 on the right 0.81 on the left 03/12/16; this is a patient I really don't have a good sense of. It would appear for the first 5 or 6 weeks of her stay in this clinic she was cared for by Dr. Con Memos. She appeared on my schedule in mid October and I saw her twice. She has not been here however in 3 weeks. She has advanced Wound Care at home where she lives with her husband. She has multiple sclerosis. I saw her the first time she had 4 punched-out small wounds on the anterior lateral left leg it appears that she is now down to 2. She also had wounds on the lateral and medial aspect of her right leg which were also small and punched out. The only one that remains is on the right lateral. Because of the nature of her wound I went ahead and ordered formal arterial studies this showed an ABI of 1 on the right and one on the left. TBI of 1.4 on the right and 0.79 on the left. She had normal waveforms. She was in  the emergency room on 02/28/16; there is a noted edema of her left leg after a fall. She had a duplex ultrasound that showed no evidence for an acute DVT from the left groin to the popliteal fossa. The study was limited in the calf veins due to edema. Also noticeable that she had a left inguinal enlarged lymph node up to 6.2 cm. She also had a left knee x-ray that showed no acute findings and a right foot x-ray that showed soft tissue swelling but no radiographic evidence of osteomyelitis. The patient does stop smoking 03-19-16 Ms Mccarn presents today for evaluation of her bilateral lower extremity ulcerations. She states that she has not smoked in several weeks. She denies any pain or discomfort to bilateral lower extremities, tolerating compression therapy as ordered. 03/26/16; I have not seen this patient in 2 weeks however she has done very well with improvements in the areas in question. After we obtain normal arterial studies, increasing the 4-layer compression really seems to look done a nice job here. She has one open area on the left lateral leg and one on the medial right leg. These have filled in nicely and are now superficial wounds that showed epithelialized. I note the left inguinal lymph node at  6.2 cm. I may need to refer this back to the patient's primary doctor. 04/03/16; patient has 2 remaining wounds 1 on the left lateral leg and one on the right medial leg. Both of filled in nicely since we are able to increase her from a 3 layer to a 4 layer compression. I am also following up on the lymph node notable on her left leg DVT rule out in November 04/10/16; area on the right lateral leg is healed. The area on the left leg is still open but looks considerably better with healthy granulation and less wound area. I12/20/17; last week we healed the patient doubt with regards to the wound on the right leg to her on compression stocking 20-30 mmHg as it turns out I don't think she had a  stocking, predictably therefore she has reopened. The left leg as well as the wound on the lateral aspect. Been generally small line 04/24/16; we healed out her right leg 2 weeks ago to her own 20-30 mm compression stockings although as it turns out she didn't have these and did not purchase some apparently because of financial issues. The left leg wound is on the lateral aspect. Both of these wounds are just about healed. 05/01/16; her wounds on the left leg are healed out today. The area on the right leg is also healed. Vitamin diligent effort of our staff we have not been able to get any form of compression stocking to this patient. The orders for juxtalite's were given to home health this never materialized. We ordered compression stockings I don't think she was able to afford these. We had a discussion today with both the patient and her husband without these, will accumulate edema and the wounds will simply reopen again. 05/14/16 READMISSION Crichlow, Fedora ([id number]) this is a patient we healed out 2 weeks ago. As bilateral lower extremity venous wounds probably some degree of lymphedema. At the beginning in November I did a duplex ultrasound of her left leg that was negative for DVT but did show a lymph node in the left inguinal area presumably reactive. We've been using compression to her lower extremities eventually healed all her wounds on her bilateral calves but we were not able to get any form of compression stockings for her in spite of intense efforts of our staff. She returns today with reopening on the left leg did not the right she has several superficial areas anteriorly but an actual frank ulcer on the lateral left calf. This is about the size of a dime or smaller. She does not really complain of pain. The patient has a history of PAD however arterial studies we did in November were normal. Her ABIs and Doppler waveforms were both within the normal limits. The patient has a  history of MS. In discussing things with her today it would appear that the opening was noted 2 weeks ago according to the patient. We gave her Tubigrip stockings when she left the clinic 05/21/16; she has not yet had the duplex ultrasound of her thigh, this is booked for Thursday. The wound on the left lateral lower leg is improved 05/28/16; her duplex ultrasound of the left leg specifically her left thigh did not show a DVT however did show hypoechogenic enlarged left inguinal lymph nodes up to 6.9 x 3.7 x 6.8. This also showed in the one in November however maximal diameter was 6.2, at that point I thought these were reactive secondary to cellulitis however there is obviously  a differential here. Her wound on the left lower leg is closed. She has a superficial open area on the base of her left fifth metatarsal head on the plantar foot. It looks as though she has some wrap injuries on the anterior leg 06/05/16; since she was last year she had a fall on 1/31. She was seen in the ER but sent back home. I think she is also been seen by her primary physician who picked up on the swelling and the lymphadenopathy. She is ordered a CT scan of the abdomen and pelvis however this will be without contrast because of stage IIIB renal insufficiency. Nevertheless this should be a start the workup to make sure there is not is more systemic problem here. She will probably need consideration of a biopsy of the lymph node in her inguinal area, this would need general surgery. As far as her wounds are concern today the area on the left plantar foot is healed. She has a small weeping area on the left lower leg. The wrap injury from last week has largely Pardoxically there is a lot less edema in the left thigh. 06/12/16; CT scan is on Friday. She has 2 small open areas one on the lateral left lower leg and one on the anterior lower leg on the left. Both of these looks healthy. I reduced her compression last week to Kerlix  and Coban this seems to have not a reasonable job 07/02/16; the patient is now in the skilled facility. I think this was arranged out of the ER when she fell at home although I'm not exactly sure. She is going for I think a laparotomy for the pelvic mass next week. All the wounds in the left leg have healed Readmission: 07/20/2020 upon evaluation today patient presents for reevaluation here in clinic unfortunately she is having issues with a significant stage III pressure ulcer to her left heel. This is something that occurred when she was in the hospital sick. And subsequently though she does not appear to have any new injury she is still trying to recover from the pressure injury which was quite severe during that time. I do not see any signs of infection right now which is great news. With that being said she has been require some debridement to try to clear this up as much as possible. We need to get the wound VAC cleared so that she can see overall healing and improvement that were looking for. The patient does have a history of hypertension, chronic kidney disease stage III, COPD, and multiple sclerosis. Currently she has been trying to do what she can to keep pressure off of her heel while in bed and in other locations as well. 08/04/20 upon evaluation today patient appears to be doing extremely well currently in regard to her wound on the heel. I do believe the Iodoflex has helped loosen things appear. Unfortunately she tells me that the home health nurse has not been putting the Iodoflex on the wound. That is news to [place] and that they tell me they got supplies in the mail but they did not see the Iodoflex being used. Nonetheless I am wondering if this is just something that they missed or if it truly has not been. I did tell them to look when they get home in their box of supplies to see if they saw the dressing which showed and what it should look like. 08/11/2020 upon evaluation today patient  appears to be doing decently  well in regard to her wound although unfortunately this is showing signs of some blue-green drainage consistent with Pseudomonas it does have a little worried in this regard. Fortunately there is no evidence of active infection which is great news. No fevers, chills, nausea, vomiting, or diarrhea. 08/18/2020 upon evaluation today patient appears to be doing well with regard to her wound. She has been tolerating the dressing changes without complication. Fortunately there is no signs of active infection at this time. No fevers, chills, nausea, vomiting, or diarrhea. 4/29; left heel wound pressure ulcer. Slightly smaller. 09/04/2020 upon evaluation today patient appears to be doing well with regard to her wound. This is actually on her heel and seems to be doing well other than the fact that is a little bit moist from excessive drainage. There does not appear to be any signs of active infection at this time. No fevers, chills, nausea, vomiting, or diarrhea.. 09/11/2020 upon evaluation today patient appears to be doing okay in regard to her wound. With that being said she still has quite a bit of drainage noted at this point. Fortunately there is no signs of active infection which is great news overall I am extremely pleased with where things stand today. I do think we need to try to do something a little bit more to help control the drainage a bit better. 5/23; left posterior heel. This actually looks quite good. Using Hydrofera Blue under compression. Dimensions are smaller 09/26/2020 upon evaluation today patient appears to be doing well with regard to her heel ulcer. In fact this is showing signs of excellent improvement and overall I am extremely pleased with where things stand today. I do not see any evidence of infection which is great news and overall I feel like she is headed in the correct direction. 10/09/2020 upon evaluation today patient appears to be doing excellent  in regard to her wound. She has been tolerating the dressing changes without complication. Fortunately there is no evidence of active infection which is also excellent news. No fevers, chills, nausea, vomiting, or diarrhea. 10/16/2020 upon evaluation today patient appears to be doing well with regard to her heel ulcer. Unfortunately she is having issues with an area of deep tissue injury/pressure injury over the shin due to the wrap being on way too tight today. Fortunately there does not appear to be any signs of infection currently. No fevers, chills, nausea, vomiting, or diarrhea. 10/23/2020 upon evaluation today patient appears to be doing better in regard to her wound. Fortunately there does not appear to be any signs of active infection at this time which is great news. In general I am extremely pleased with where things stand at this point. The patient does not show any signs of worsening which is excellent. She unfortunately still has the area in question with regard to the region over the anterior portion of her shin where she did have some deep tissue injury. Some of this is starting to open up a little bit. With that being said I think the biggest thing we need to do at this point is to try to as much as possible monitor for any additional breakdown and protect the region while it heals up. I think it is actually doing quite well which is great news. Fortunately there does not appear to be any evidence of active infection 11/14/2018 upon evaluation today patient appears to be doing okay in regard to her ischial. Her leg is much more swollen though she has not  had a wrap on any type of dressing for some time when home health came out they did not have the Tubigrip. We have not actually seen her since June 27. The patient when I asked her about it seemed to get somewhat upset to be honest. She seems to be having some trouble at home. She states that she had to "raise hell" just to be able to get  here to this appointment today. With that being said I really do think she needs to have Struckman, Carlinda H. ([id number]) this wraps and keep the swelling under control and was doing a whole lot better but now seems to be not doing quite as well to be honest. The anterior portion of her shin does seem to be healing and doing better although it did end up opening into the wound. Again that was where home health the wrap does have a little bit too tight causing this issue previously. 11/27/2020 upon evaluation today patient appears to be doing poorly in regard to her anterior shin where she did have some area where the wrap actually aggravated the spot. With that being said I think that we do need to make sure to pad this extremely well there is already some signs of healing at this point. I did believe this was probably can open even when I saw it last week and to be honest it has opened. The heel also is showing signs of improvement I do see some slough noted but nothing too significant which is great news. No fevers, chills, nausea, vomiting, or diarrhea. 12/07/2020 upon evaluation today patient appears to be doing decently well in regard to her wounds. Both are showing signs of some slough and biofilm buildup on the surface of the wounds. Fortunately there is no signs of active infection at this time which is great news. No fevers, chills, nausea, vomiting, or diarrhea. 12/15/2020 upon evaluation today patient appears to be doing well with regard to her wounds. She is showing signs of improvement at both locations which is great news. Fortunately I do not see any evidence of active infection at this time which is great as well. 12/25/2020 upon evaluation today patient appears to be doing well with regard to her wound on the posterior heel. This seems to be making some progress which is great news. Unfortunately the biggest issue is that of the shin which again just does not seem to be healing as effectively  and quickly as I would like to see. Fortunately there does not appear to be any evidence of active infection which is great news I do believe that we can try to do something a little different using Tubigrip instead of the compression wrapping to see if this will keep pressure off of the shin allow this to heal more effectively and quickly. 01/02/2021 upon evaluation today patient's wound on the shin actually appears to be making excellent progress which is great news and overall extremely pleased with where things stand today. There is no sign of active infection at this time. No fevers, chills, nausea, vomiting, or diarrhea. Her heel is going require some debridement but overall does not appear to be doing too poorly. 01/09/2021 upon evaluation today patient appears to be doing better in general in regard to her wounds overall. The anterior shin especially seems to be making good progress. I think we are getting close to resolution here. The heel is a little bit more concerning and that it measures a little larger. There  is also some erythema surrounding. I think that we probably need to see about doing something to address this I think a culture is probably appropriate as well. 9/26; patient has 2 wound areas 1 on the left anterior shin and the other on the left Achilles heel. Using Hydrofera Blue to both of these. The patient had a culture done last time that showed Pseudomonas from the left Achilles heel. I am not certain whether she received antibiotics 02/01/2021 upon evaluation today patient appears to be doing well with regard to her wound on the shin this is completely healed. In regard to the wound on her heel this is showing signs of improvement which is good news fortunately there is no evidence of active infection which is great and overall I think you are making progress here since the beginning although it has somewhat plateaued a little bit more recently. 02/15/2021 upon evaluation today  patient appears to be doing well with regard to her anterior leg ulcer this is showing signs of complete epithelization and looks good. In regard to the heel this is still open and giving her trouble. Again this is a pressure injury and fortunately I do not see any signs of infection but unfortunately is also not significantly smaller which is not good. Overall I think that her biggest issue currently is good to be to try to do something to get this moving in the right direction I Minna check into The[facility]kin is a possibility for her I think this could be of benefit. Electronic Signature(s) Signed: 02/15/2021 2:45:15 PM By: Worthy Keeler PA-C Entered By: Worthy Keeler on 02/15/2021 14:45:14 [name] ([id number]) -------------------------------------------------------------------------------- Physical Exam Details Patient Name: [name] Date of Service: 02/15/2021 2:00 PM Medical Record Number: [id number] Patient Account Number: [id number] Date of Birth/Sex: 02-Jun-1950 (70 y.o. F) Treating RN: [name] Primary Care Provider: [name] Other Clinician: Referring Provider: [name] Treating Provider/Extender: [name] in Treatment: 75 Constitutional Well-nourished and well-hydrated in no acute distress. Respiratory normal breathing without difficulty. Psychiatric this patient is able to make decisions and demonstrates good insight into disease process. Alert and Oriented x 3. pleasant and cooperative. Notes Upon inspection patient's wound bed actually showed signs of good granulation epithelization at this point. Fortunately there does not appear to be any signs of infection but this is just not measuring smaller at this time. Overall I think that we probably need to go ahead and see what we can do about switching things up a little bit I would recommend endoform to give this a shot. Electronic Signature(s) Signed: 02/15/2021 2:45:36 PM By:  Worthy Keeler PA-C Entered By: Worthy Keeler on 02/15/2021 14:45:36 [name] ([id number]) -------------------------------------------------------------------------------- Physician Orders Details Patient Name: [name] Date of Service: 02/15/2021 2:00 PM Medical Record Number: [id number] Patient Account Number: [id number] Date of Birth/Sex: Sep 03, 1950 (70 y.o. F) Treating RN: [name] Primary Care Provider: [name] Other Clinician: Referring Provider: [name] Treating Provider/Extender: [name] in Treatment: 24 Verbal / Phone Orders: No Diagnosis Coding ICD-10 Coding Code Description [phone] Pressure ulcer of left heel, stage 3 I87.2 Venous insufficiency (chronic) (peripheral) L97.822 Non-pressure chronic ulcer of other part of left lower leg with fat layer exposed I10 Essential (primary) hypertension N18.30 Chronic kidney disease, stage 3 unspecified J44.9 Chronic obstructive pulmonary disease, unspecified G35 Multiple sclerosis Follow-up Appointments o Return Appointment in 1 week. English  for wound care. May utilize formulary equivalent dressing for wound treatment orders unless otherwise specified. Home Health Nurse may visit PRN to address patientos wound care needs. o Scheduled days for dressing changes to be completed; exception, patient has scheduled wound care visit that day. o **Please direct any NON-WOUND related issues/requests for orders to patient's Primary Care Physician. **If current dressing causes regression in wound condition, may D/C ordered dressing product/s and apply Normal Saline Moist Dressing daily until next [place] or Other MD appointment. **Notify [facility] of regression in wound condition at [phone]. Edema Control - Lymphedema / Segmental Compressive Device / Other Bilateral Lower Extremities o  Tubigrip single layer applied. - size D o Elevate, Exercise Daily and Avoid Standing for Long Periods of Time. o Elevate legs to the level of the heart and pump ankles as often as possible - ELEVATE LEGS o Elevate leg(s) parallel to the floor when sitting. Off-Loading Left Lower Extremity o Open toe surgical shoe o Turn and reposition every 2 hours o Other: - Keep pressure off left heel Wound Treatment Wound #12 - Calcaneus Wound Laterality: Left Cleanser: Soap and Water 2 x Per Week/30 Days Discharge Instructions: Gently cleanse wound with antibacterial soap, rinse and pat dry prior to dressing wounds Primary Dressing: Endoform 2x2 (in/in) 2 x Per Week/30 Days Discharge Instructions: Apply Endoform as directed Secondary Dressing: Mepilex Border Flex, 4x4 (in/in) 2 x Per Week/30 Days Discharge Instructions: Apply to wound as directed. Do not cut. Notes [place] IVR Electronic Signature(s) Signed: 02/15/2021 4:40:38 PM By: [name] RN ADVIKA, MCLELLAND ([id number]) Signed: 02/16/2021 4:28:58 PM By: Worthy Keeler PA-C Entered By: [name] on 02/15/2021 14:43:44 LISET, MCMONIGLE ([id number]) -------------------------------------------------------------------------------- Problem List Details Patient Name: [name] Date of Service: 02/15/2021 2:00 PM Medical Record Number: [id number] Patient Account Number: [id number] Date of Birth/Sex: [birth year] (70 y.o. F) Treating RN: [name] Primary Care Provider: [name] Other Clinician: Referring Provider: [name] Treating Provider/Extender: [name] in Treatment: 30 Active Problems ICD-10 Encounter Code Description Active Date MDM Diagnosis [phone] Pressure ulcer of left heel, stage 3 07/20/2020 No Yes I87.2 Venous insufficiency (chronic) (peripheral) 01/02/2021 No Yes L97.822 Non-pressure chronic ulcer of other part of left lower leg with fat layer 11/27/2020 No  Yes exposed [place] (primary) hypertension 07/20/2020 No Yes N18.30 Chronic kidney disease, stage 3 unspecified 07/20/2020 No Yes J44.9 Chronic obstructive pulmonary disease, unspecified 07/20/2020 No Yes G35 Multiple sclerosis 07/20/2020 No Yes Inactive Problems Resolved Problems Electronic Signature(s) Signed: 02/15/2021 2:34:23 PM By: Worthy Keeler PA-C Entered By: Worthy Keeler on 02/15/2021 14:34:23 Oelkers, [name] ([id number]) -------------------------------------------------------------------------------- Progress Note Details Patient Name: [name] Date of Service: 02/15/2021 2:00 PM Medical Record Number: [id number] Patient Account Number: [id number] Date of Birth/Sex: Sep 16, 1950 (70 y.o. F) Treating RN: [name] Primary Care Provider: [name] Other Clinician: Referring Provider: [name] Treating Provider/Extender: [name] in Treatment: 30 Subjective Chief Complaint Information obtained from Patient Left Heel Pressure Ulcer History of Present Illness (HPI) 70 year old with a past medical history significant for MS, urinary incontinence, and obesity. She has been seen in the wound clinic before for lower extremity ulcerations treated with compression therapy. she is also known to have hypertension, peripheral vascular disease, COPD, obstructive sleep apnea, lumbar radiculopathy, kyphoscoliosis, urinary issues and tobacco abuse. Smokes a packet of cigarettes a day was recently seen at the [facility] for swelling of her legs and feet with a ulceration  on the dorsum of the right foot which has been there for about 6 months. she was recently in the ER about a month ago where she was seen for shortness of breath and swelling of the legs and a chest x-ray was within normal limits had an increase in her BNP and was given Lasix and put on doxycycline for a mild cellulitis and possible UTI. Wounds aresmaller today  11/13/15. Debrided and will continue Santyl. 12/21/2015 -- she was admitted to the hospital overnight on 12/18/2015 and diagnosed with urinary retention and cellulitis of the left lower leg. is past to take clindamycin and use Santyl for her wounds. 01/15/2016 -- come back to see [place] for almost a month and continues to be noncompliant with her dressings 01/30/16 patient presents today for a follow-up visit concerning her ongoing bilateral lower extremity wounds. We have not seen her for the past 2 weeks although we are supposed to be seen her for weekly visits. She currently has a Foley catheter. She tells me that the wounds are intensely painful especially with pressure and palpation at this point in time. Fortunately she is having no interval signs or symptoms of systemic infection but unfortunately the wounds have not improved dramatically since we last saw her. She does have home health coming to take care of her as well. She is currently not in any compression wraps. 02/06/16 ON evaluation today patient continues to experience discomfort regarding her bilateral lower extremity ulcers. She has continued to tolerate the dressing changes at this point in time and continues to have a Foley catheter as well. Fortunately she is back this week in the past she has been somewhat noncompliant with follow-ups I'm glad to see her today. She tells me that her pain level varies but can be as high as a 7 out of 10 with manipulation of the wound. She tells me that she used to be on oxycodone which was managed by the pain clinic although she is no longer on that as she tells me that she was actually smoking marijuana at the time and when this was found out they discontinued her pain medication. She no longer is taking anything pain medication wise and she also does not smoke cigarettes nor marijuana at this point. 02/12/16; this is a patient I don't believe I have previously seen. She has multiple sclerosis. She has  4 punched-out areas on the anterior lateral left leg 2 areas on the right these are all in the same condition. Reasonably small [dime size] wounds each was some depth. Surface of these wounds does not look particularly healthy as there is adherent slough. There is no evidence of surrounding infection or inflammation. ABIs in this clinic were 0.87 on the right and 0.81 on the left. She is listed as having PAD and is a smoker. Not a diabetic 02/20/16 patient I gave antibiotics to last week for erythema around both wound sites on the left lateral leg and right lateral leg. This appears to be a lot better. One of the areas on the left leg is healed however she still has 3 punched-out open areas on the left lateral calf and one on the right lateral calf and one on the dorsal foot. She is an ex-smoker quitting 1 month ago. ABIs in this clinic were 0.87 on the right 0.81 on the left 03/12/16; this is a patient I really don't have a good sense of. It would appear for the first 5 or 6 weeks of her  stay in this clinic she was cared for by Dr. Con Memos. She appeared on my schedule in mid October and I saw her twice. She has not been here however in 3 weeks. She has advanced Wound Care at home where she lives with her husband. She has multiple sclerosis. I saw her the first time she had 4 punched-out small wounds on the anterior lateral left leg it appears that she is now down to 2. She also had wounds on the lateral and medial aspect of her right leg which were also small and punched out. The only one that remains is on the right lateral. Because of the nature of her wound I went ahead and ordered formal arterial studies this showed an ABI of 1 on the right and one on the left. TBI of 1.4 on the right and 0.79 on the left. She had normal waveforms. She was in the emergency room on 02/28/16; there is a noted edema of her left leg after a fall. She had a duplex ultrasound that showed no evidence for an acute DVT from  the left groin to the popliteal fossa. The study was limited in the calf veins due to edema. Also noticeable that she had a left inguinal enlarged lymph node up to 6.2 cm. She also had a left knee x-ray that showed no acute findings and a right foot x-ray that showed soft tissue swelling but no radiographic evidence of osteomyelitis. The patient does stop smoking 03-19-16 Ms Gear presents today for evaluation of her bilateral lower extremity ulcerations. She states that she has not smoked in several weeks. She denies any pain or discomfort to bilateral lower extremities, tolerating compression therapy as ordered. 03/26/16; I have not seen this patient in 2 weeks however she has done very well with improvements in the areas in question. After we obtain normal arterial studies, increasing the 4-layer compression really seems to look done a nice job here. She has one open area on the left lateral leg and one on the medial right leg. These have filled in nicely and are now superficial wounds that showed epithelialized. I note the left inguinal lymph node at 6.2 cm. I may need to refer this back to the patient's primary doctor. 04/03/16; patient has 2 remaining wounds 1 on the left lateral leg and one on the right medial leg. Both of filled in nicely since we are able to increase her from a 3 layer to a 4 layer compression. I am also following up on the lymph node notable on her left leg DVT rule out in November 04/10/16; area on the right lateral leg is healed. The area on the left leg is still open but looks considerably better with healthy granulation and less wound area. I12/20/17; last week we healed the patient doubt with regards to the wound on the right leg to her on compression stocking 20-30 mmHg as it turns out I don't think she had a stocking, predictably therefore she has reopened. The left leg as well as the wound on the lateral aspect. Been generally small line 04/24/16; we healed out her  right leg 2 weeks ago to her own 20-30 mm compression stockings although as it turns out she didn't have these and did not purchase some apparently because of financial issues. The left leg wound is on the lateral aspect. Both of these wounds are just about healed. 05/01/16; her wounds on the left leg are healed out today. The area on the right leg  is also healed. Vitamin diligent effort of our staff we have not been able to get any form of compression stocking to this patient. The orders for juxtalite's were given to home health this never materialized. We Pietrzyk, Sotiria H. ([id number]) ordered compression stockings I don't think she was able to afford these. We had a discussion today with both the patient and her husband without these, will accumulate edema and the wounds will simply reopen again. 05/14/16 READMISSION this is a patient we healed out 2 weeks ago. As bilateral lower extremity venous wounds probably some degree of lymphedema. At the beginning in November I did a duplex ultrasound of her left leg that was negative for DVT but did show a lymph node in the left inguinal area presumably reactive. We've been using compression to her lower extremities eventually healed all her wounds on her bilateral calves but we were not able to get any form of compression stockings for her in spite of intense efforts of our staff. She returns today with reopening on the left leg did not the right she has several superficial areas anteriorly but an actual frank ulcer on the lateral left calf. This is about the size of a dime or smaller. She does not really complain of pain. The patient has a history of PAD however arterial studies we did in November were normal. Her ABIs and Doppler waveforms were both within the normal limits. The patient has a history of MS. In discussing things with her today it would appear that the opening was noted 2 weeks ago according to the patient. We gave her Tubigrip stockings when  she left the clinic 05/21/16; she has not yet had the duplex ultrasound of her thigh, this is booked for Thursday. The wound on the left lateral lower leg is improved 05/28/16; her duplex ultrasound of the left leg specifically her left thigh did not show a DVT however did show hypoechogenic enlarged left inguinal lymph nodes up to 6.9 x 3.7 x 6.8. This also showed in the one in November however maximal diameter was 6.2, at that point I thought these were reactive secondary to cellulitis however there is obviously a differential here. Her wound on the left lower leg is closed. She has a superficial open area on the base of her left fifth metatarsal head on the plantar foot. It looks as though she has some wrap injuries on the anterior leg 06/05/16; since she was last year she had a fall on 1/31. She was seen in the ER but sent back home. I think she is also been seen by her primary physician who picked up on the swelling and the lymphadenopathy. She is ordered a CT scan of the abdomen and pelvis however this will be without contrast because of stage IIIB renal insufficiency. Nevertheless this should be a start the workup to make sure there is not is more systemic problem here. She will probably need consideration of a biopsy of the lymph node in her inguinal area, this would need general surgery. As far as her wounds are concern today the area on the left plantar foot is healed. She has a small weeping area on the left lower leg. The wrap injury from last week has largely Pardoxically there is a lot less edema in the left thigh. 06/12/16; CT scan is on Friday. She has 2 small open areas one on the lateral left lower leg and one on the anterior lower leg on the left. Both of these looks  healthy. I reduced her compression last week to Kerlix and Coban this seems to have not a reasonable job 07/02/16; the patient is now in the skilled facility. I think this was arranged out of the ER when she fell at home  although I'm not exactly sure. She is going for I think a laparotomy for the pelvic mass next week. All the wounds in the left leg have healed Readmission: 07/20/2020 upon evaluation today patient presents for reevaluation here in clinic unfortunately she is having issues with a significant stage III pressure ulcer to her left heel. This is something that occurred when she was in the hospital sick. And subsequently though she does not appear to have any new injury she is still trying to recover from the pressure injury which was quite severe during that time. I do not see any signs of infection right now which is great news. With that being said she has been require some debridement to try to clear this up as much as possible. We need to get the wound VAC cleared so that she can see overall healing and improvement that were looking for. The patient does have a history of hypertension, chronic kidney disease stage III, COPD, and multiple sclerosis. Currently she has been trying to do what she can to keep pressure off of her heel while in bed and in other locations as well. 08/04/20 upon evaluation today patient appears to be doing extremely well currently in regard to her wound on the heel. I do believe the Iodoflex has helped loosen things appear. Unfortunately she tells me that the home health nurse has not been putting the Iodoflex on the wound. That is news to [place] and that they tell me they got supplies in the mail but they did not see the Iodoflex being used. Nonetheless I am wondering if this is just something that they missed or if it truly has not been. I did tell them to look when they get home in their box of supplies to see if they saw the dressing which showed and what it should look like. 08/11/2020 upon evaluation today patient appears to be doing decently well in regard to her wound although unfortunately this is showing signs of some blue-green drainage consistent with Pseudomonas it does  have a little worried in this regard. Fortunately there is no evidence of active infection which is great news. No fevers, chills, nausea, vomiting, or diarrhea. 08/18/2020 upon evaluation today patient appears to be doing well with regard to her wound. She has been tolerating the dressing changes without complication. Fortunately there is no signs of active infection at this time. No fevers, chills, nausea, vomiting, or diarrhea. 4/29; left heel wound pressure ulcer. Slightly smaller. 09/04/2020 upon evaluation today patient appears to be doing well with regard to her wound. This is actually on her heel and seems to be doing well other than the fact that is a little bit moist from excessive drainage. There does not appear to be any signs of active infection at this time. No fevers, chills, nausea, vomiting, or diarrhea.. 09/11/2020 upon evaluation today patient appears to be doing okay in regard to her wound. With that being said she still has quite a bit of drainage noted at this point. Fortunately there is no signs of active infection which is great news overall I am extremely pleased with where things stand today. I do think we need to try to do something a little bit more to help control the  drainage a bit better. 5/23; left posterior heel. This actually looks quite good. Using Hydrofera Blue under compression. Dimensions are smaller 09/26/2020 upon evaluation today patient appears to be doing well with regard to her heel ulcer. In fact this is showing signs of excellent improvement and overall I am extremely pleased with where things stand today. I do not see any evidence of infection which is great news and overall I feel like she is headed in the correct direction. 10/09/2020 upon evaluation today patient appears to be doing excellent in regard to her wound. She has been tolerating the dressing changes without complication. Fortunately there is no evidence of active infection which is also  excellent news. No fevers, chills, nausea, vomiting, or diarrhea. 10/16/2020 upon evaluation today patient appears to be doing well with regard to her heel ulcer. Unfortunately she is having issues with an area of deep tissue injury/pressure injury over the shin due to the wrap being on way too tight today. Fortunately there does not appear to be any signs of infection currently. No fevers, chills, nausea, vomiting, or diarrhea. 10/23/2020 upon evaluation today patient appears to be doing better in regard to her wound. Fortunately there does not appear to be any signs of active infection at this time which is great news. In general I am extremely pleased with where things stand at this point. The patient does not show any signs of worsening which is excellent. She unfortunately still has the area in question with regard to the region over the anterior portion of her shin where she did have some deep tissue injury. Some of this is starting to open up a little bit. With that being said I think the biggest thing we need to do at this point is to try to as much as possible monitor for any additional breakdown and protect the region while it heals up. I think it is actually doing quite well which is great news. Fortunately there does not appear to be any evidence of active infection JUDIANN, CELIA. ([id number]) 11/14/2018 upon evaluation today patient appears to be doing okay in regard to her ischial. Her leg is much more swollen though she has not had a wrap on any type of dressing for some time when home health came out they did not have the Tubigrip. We have not actually seen her since June 27. The patient when I asked her about it seemed to get somewhat upset to be honest. She seems to be having some trouble at home. She states that she had to "raise hell" just to be able to get here to this appointment today. With that being said I really do think she needs to have this wraps and keep the swelling under  control and was doing a whole lot better but now seems to be not doing quite as well to be honest. The anterior portion of her shin does seem to be healing and doing better although it did end up opening into the wound. Again that was where home health the wrap does have a little bit too tight causing this issue previously. 11/27/2020 upon evaluation today patient appears to be doing poorly in regard to her anterior shin where she did have some area where the wrap actually aggravated the spot. With that being said I think that we do need to make sure to pad this extremely well there is already some signs of healing at this point. I did believe this was probably can open even when  I saw it last week and to be honest it has opened. The heel also is showing signs of improvement I do see some slough noted but nothing too significant which is great news. No fevers, chills, nausea, vomiting, or diarrhea. 12/07/2020 upon evaluation today patient appears to be doing decently well in regard to her wounds. Both are showing signs of some slough and biofilm buildup on the surface of the wounds. Fortunately there is no signs of active infection at this time which is great news. No fevers, chills, nausea, vomiting, or diarrhea. 12/15/2020 upon evaluation today patient appears to be doing well with regard to her wounds. She is showing signs of improvement at both locations which is great news. Fortunately I do not see any evidence of active infection at this time which is great as well. 12/25/2020 upon evaluation today patient appears to be doing well with regard to her wound on the posterior heel. This seems to be making some progress which is great news. Unfortunately the biggest issue is that of the shin which again just does not seem to be healing as effectively and quickly as I would like to see. Fortunately there does not appear to be any evidence of active infection which is great news I do believe that we can  try to do something a little different using Tubigrip instead of the compression wrapping to see if this will keep pressure off of the shin allow this to heal more effectively and quickly. 01/02/2021 upon evaluation today patient's wound on the shin actually appears to be making excellent progress which is great news and overall extremely pleased with where things stand today. There is no sign of active infection at this time. No fevers, chills, nausea, vomiting, or diarrhea. Her heel is going require some debridement but overall does not appear to be doing too poorly. 01/09/2021 upon evaluation today patient appears to be doing better in general in regard to her wounds overall. The anterior shin especially seems to be making good progress. I think we are getting close to resolution here. The heel is a little bit more concerning and that it measures a little larger. There is also some erythema surrounding. I think that we probably need to see about doing something to address this I think a culture is probably appropriate as well. 9/26; patient has 2 wound areas 1 on the left anterior shin and the other on the left Achilles heel. Using Hydrofera Blue to both of these. The patient had a culture done last time that showed Pseudomonas from the left Achilles heel. I am not certain whether she received antibiotics 02/01/2021 upon evaluation today patient appears to be doing well with regard to her wound on the shin this is completely healed. In regard to the wound on her heel this is showing signs of improvement which is good news fortunately there is no evidence of active infection which is great and overall I think you are making progress here since the beginning although it has somewhat plateaued a little bit more recently. 02/15/2021 upon evaluation today patient appears to be doing well with regard to her anterior leg ulcer this is showing signs of complete epithelization and looks good. In regard to the  heel this is still open and giving her trouble. Again this is a pressure injury and fortunately I do not see any signs of infection but unfortunately is also not significantly smaller which is not good. Overall I think that her biggest issue currently is  good to be to try to do something to get this moving in the right direction I Minna check into TheraSkin is a possibility for her I think this could be of benefit. Objective Constitutional Well-nourished and well-hydrated in no acute distress. Vitals Time Taken: 2:13 PM, Height: 64 in, Weight: 265 lbs, BMI: 45.5, Temperature: 98.4 F, Pulse: 84 bpm, Respiratory Rate: 18 breaths/min, Blood Pressure: 148/84 mmHg. Respiratory normal breathing without difficulty. Psychiatric this patient is able to make decisions and demonstrates good insight into disease process. Alert and Oriented x 3. pleasant and cooperative. General Notes: Upon inspection patient's wound bed actually showed signs of good granulation epithelization at this point. Fortunately there does not appear to be any signs of infection but this is just not measuring smaller at this time. Overall I think that we probably need to go ahead and see what we can do about switching things up a little bit I would recommend endoform to give this a shot. Integumentary (Hair, Skin) Wound #12 status is Open. Original cause of wound was Pressure Injury. The date acquired was: 06/27/2020. The wound has been in treatment 30 [place], Katriona H. ([id number]) weeks. The wound is located on the Left Calcaneus. The wound measures 2cm length x 2.4cm width x 0.2cm depth; 3.77cm^2 area and 0.754cm^3 volume. There is Fat Layer (Subcutaneous Tissue) exposed. There is no tunneling or undermining noted. There is a medium amount of serous drainage noted. The wound margin is thickened. There is large (67-100%) red, pink granulation within the wound bed. There is a small (1-33%) amount of necrotic tissue within the wound  bed including Adherent Slough. Assessment Active Problems ICD-10 Pressure ulcer of left heel, stage 3 Venous insufficiency (chronic) (peripheral) Non-pressure chronic ulcer of other part of left lower leg with fat layer exposed Essential (primary) hypertension Chronic kidney disease, stage 3 unspecified Chronic obstructive pulmonary disease, unspecified Multiple sclerosis Plan Follow-up Appointments: Return Appointment in 1 week. Home Health: [facility] for wound care. May utilize formulary equivalent dressing for wound treatment orders unless otherwise specified. Home Health Nurse may visit PRN to address patient s wound care needs. Scheduled days for dressing changes to be completed; exception, patient has scheduled wound care visit that day. **Please direct any NON-WOUND related issues/requests for orders to patient's Primary Care Physician. **If current dressing causes regression in wound condition, may D/C ordered dressing product/s and apply Normal Saline Moist Dressing daily until next [place] or Other MD appointment. **Notify [facility] of regression in wound condition at [phone]. Edema Control - Lymphedema / Segmental Compressive Device / Other: Tubigrip single layer applied. - size D Elevate, Exercise Daily and Avoid Standing for Long Periods of Time. Elevate legs to the level of the heart and pump ankles as often as possible - ELEVATE LEGS Elevate leg(s) parallel to the floor when sitting. Off-Loading: Open toe surgical shoe Turn and reposition every 2 hours Other: - Keep pressure off left heel General Notes: [place] IVR WOUND #12: - Calcaneus Wound Laterality: Left Cleanser: Soap and Water 2 x Per Week/30 Days Discharge Instructions: Gently cleanse wound with antibacterial soap, rinse and pat dry prior to dressing wounds Primary Dressing: Endoform 2x2 (in/in) 2 x Per Week/30 Days Discharge  Instructions: Apply Endoform as directed Secondary Dressing: Mepilex Border Flex, 4x4 (in/in) 2 x Per Week/30 Days Discharge Instructions: Apply to wound as directed. Do not cut. 1. Would recommend that we going to switch to endoform which I  believe may do better for her. 2. I am also going to recommend that we have the patient go ahead and continue with the border foam dressing to cover which I think is also doing a good job here. 3. I am going to recommend depend on how things go it may want to consider compression wrapping again but again I am a little leery to do that simply due to the fact that in the past we have had some trouble in that regard. This was with her anterior leg breaking down due to the pressure. Again the other thing I may need to do is send her for repeat arterial study depending on how things look. 4. We are going to look into TheraSkin is a possibility for her to see if we can gain approval I think this could also be of benefit. We will see patient back for reevaluation in 1 week here in the clinic. If anything worsens or changes patient will contact our office for additional recommendations. Electronic Signature(s) Signed: 02/15/2021 2:46:54 PM By: Salli Real, Eunice Lemmie Evens ([id number]) Entered By: Worthy Keeler on 02/15/2021 14:46:54 [name] ([id number]) -------------------------------------------------------------------------------- SuperBill Details Patient Name: [name] Date of Service: 02/15/2021 Medical Record Number: [id number] Patient Account Number: [id number] Date of Birth/Sex: 12-22-1950 (70 y.o. F) Treating RN: [name] Primary Care Provider: [name] Other Clinician: Referring Provider: [name] Treating Provider/Extender: [name] in Treatment: 30 Diagnosis Coding ICD-10 Codes Code Description [id number] Pressure ulcer of left heel, stage 3 I87.2 Venous insufficiency (chronic)  (peripheral) L97.822 Non-pressure chronic ulcer of other part of left lower leg with fat layer exposed [place] (primary) hypertension N18.30 Chronic kidney disease, stage 3 unspecified J44.9 Chronic obstructive pulmonary disease, unspecified G35 Multiple sclerosis Physician Procedures CPT4 Code: [id number] Description: 99214 - WC PHYS LEVEL 4 - EST PT Modifier: Quantity: 1 CPT4 Code: Description: ICD-10 Diagnosis Description L89.623 Pressure ulcer of left heel, stage 3 I87.2 Venous insufficiency (chronic) (peripheral) L97.822 Non-pressure chronic ulcer of other part of left lower leg with fat lay [place] (primary) hypertension Modifier: er exposed Quantity: Electronic Signature(s) Signed: 02/15/2021 2:47:09 PM By: Worthy Keeler PA-C Entered By: Worthy Keeler on 02/15/2021 14:47:09

## 2021-02-26 ENCOUNTER — Ambulatory Visit: Payer: Medicaid Other | Admitting: Physician Assistant

## 2021-03-05 ENCOUNTER — Encounter: Payer: Medicare Other | Attending: Physician Assistant | Admitting: Physician Assistant

## 2021-03-05 ENCOUNTER — Other Ambulatory Visit: Payer: Self-pay

## 2021-03-05 DIAGNOSIS — L89623 Pressure ulcer of left heel, stage 3: Secondary | ICD-10-CM | POA: Diagnosis present

## 2021-03-05 DIAGNOSIS — N183 Chronic kidney disease, stage 3 unspecified: Secondary | ICD-10-CM | POA: Diagnosis not present

## 2021-03-05 DIAGNOSIS — Z6841 Body Mass Index (BMI) 40.0 and over, adult: Secondary | ICD-10-CM | POA: Insufficient documentation

## 2021-03-05 DIAGNOSIS — G35 Multiple sclerosis: Secondary | ICD-10-CM | POA: Diagnosis not present

## 2021-03-05 DIAGNOSIS — L97822 Non-pressure chronic ulcer of other part of left lower leg with fat layer exposed: Secondary | ICD-10-CM | POA: Diagnosis not present

## 2021-03-05 DIAGNOSIS — I872 Venous insufficiency (chronic) (peripheral): Secondary | ICD-10-CM | POA: Diagnosis not present

## 2021-03-05 DIAGNOSIS — J449 Chronic obstructive pulmonary disease, unspecified: Secondary | ICD-10-CM | POA: Insufficient documentation

## 2021-03-05 DIAGNOSIS — G4733 Obstructive sleep apnea (adult) (pediatric): Secondary | ICD-10-CM | POA: Diagnosis not present

## 2021-03-05 DIAGNOSIS — F17210 Nicotine dependence, cigarettes, uncomplicated: Secondary | ICD-10-CM | POA: Insufficient documentation

## 2021-03-05 DIAGNOSIS — I129 Hypertensive chronic kidney disease with stage 1 through stage 4 chronic kidney disease, or unspecified chronic kidney disease: Secondary | ICD-10-CM | POA: Diagnosis not present

## 2021-03-05 DIAGNOSIS — I739 Peripheral vascular disease, unspecified: Secondary | ICD-10-CM | POA: Diagnosis not present

## 2021-03-05 DIAGNOSIS — E669 Obesity, unspecified: Secondary | ICD-10-CM | POA: Insufficient documentation

## 2021-03-05 NOTE — Progress Notes (Addendum)
KYREN, VAUX ([id number]) Visit Report for 03/05/2021 Chief Complaint Document Details Patient Name: [name], [name] Date of Service: 03/05/2021 2:45 PM Medical Record Number: [id number] Patient Account Number: [id number] Date of Birth/Sex: 1950/10/08 (70 y.o. F) Treating RN: Cornell Barman Primary Care Provider: [name] Other Clinician: Referring Provider: [name] Treating Provider/Extender: [name] in Treatment: 22 Information Obtained from: Patient Chief Complaint Left Heel Pressure Ulcer Electronic Signature(s) Signed: 03/05/2021 3:28:47 PM By: Worthy Keeler PA-C Entered By: Worthy Keeler on 03/05/2021 15:28:47 [name] ([id number]) -------------------------------------------------------------------------------- Debridement Details Patient Name: [name] Date of Service: 03/05/2021 2:45 PM Medical Record Number: [id number] Patient Account Number: [id number] Date of Birth/Sex: [birth year] (70 y.o. F) Treating RN: Cornell Barman Primary Care Provider: [name] Other Clinician: Referring Provider: [name] Treating Provider/Extender: [name] in Treatment: 32 Debridement Performed for Wound #12 Left Calcaneus Assessment: Performed By: Physician Tommie Sams., PA-C Debridement Type: Debridement Level of Consciousness (Pre- Awake and Alert procedure): Pre-procedure Verification/Time Out Yes - 15:48 Taken: Total Area Debrided (L x W): 2.1 (cm) x 3 (cm) = 6.3 (cm) Tissue and other material Viable, Non-Viable, Callus, Slough, Subcutaneous, Slough debrided: Level: Skin/Subcutaneous Tissue Debridement Description: Excisional Instrument: Curette Bleeding: Moderate Hemostasis Achieved: Pressure Response to Treatment: Procedure was tolerated well Level of Consciousness (Post- Awake and Alert procedure): Post Debridement Measurements of Total Wound Length: (cm) 2.2 Stage: Category/Stage III Width: (cm)  3.1 Depth: (cm) 0.5 Volume: (cm) 2.678 Character of Wound/Ulcer Post Debridement: Stable Post Procedure Diagnosis Same as Pre-procedure Electronic Signature(s) Signed: 03/05/2021 4:18:05 PM By: Worthy Keeler PA-C Signed: 03/05/2021 5:31:02 PM By: Gretta Cool, BSN, RN, CWS, Kim RN, BSN Entered By: Gretta Cool, BSN, RN, CWS, Kim on 03/05/2021 15:49:25 Jerkins, [name] ([id number]) -------------------------------------------------------------------------------- HPI Details Patient Name: [name] Date of Service: 03/05/2021 2:45 PM Medical Record Number: [id number] Patient Account Number: [id number] Date of Birth/Sex: [birth year] (70 y.o. F) Treating RN: Cornell Barman Primary Care Provider: [name] Other Clinician: Referring Provider: [name] Treating Provider/Extender: [name] in Treatment: 62 History of Present Illness HPI Description: 70 year old with a past medical history significant for MS, urinary incontinence, and obesity. She has been seen in the wound clinic before for lower extremity ulcerations treated with compression therapy. she is also known to have hypertension, peripheral vascular disease, COPD, obstructive sleep apnea, lumbar radiculopathy, kyphoscoliosis, urinary issues and tobacco abuse. Smokes a packet of cigarettes a day was recently seen at the [facility] for swelling of her legs and feet with a ulceration on the dorsum of the right foot which has been there for about 6 months. she was recently in the ER about a month ago where she was seen for shortness of breath and swelling of the legs and a chest x-ray was within normal limits had an increase in her BNP and was given Lasix and put on doxycycline for a mild cellulitis and possible UTI. Wounds aresmaller today 11/13/15. Debrided and will continue Santyl. 12/21/2015 -- she was admitted to the hospital overnight on 12/18/2015 and diagnosed with urinary retention and cellulitis of the  left lower leg. is past to take clindamycin and use Santyl for her wounds. 01/15/2016 -- come back to see [place] for almost a month and continues to be noncompliant with her dressings 01/30/16 patient presents today for a follow-up visit concerning her ongoing bilateral lower extremity wounds. We have not seen her for the past 2 weeks although we are supposed to be seen  her for weekly visits. She currently has a Foley catheter. She tells me that the wounds are intensely painful especially with pressure and palpation at this point in time. Fortunately she is having no interval signs or symptoms of systemic infection but unfortunately the wounds have not improved dramatically since we last saw her. She does have home health coming to take care of her as well. She is currently not in any compression wraps. 02/06/16 ON evaluation today patient continues to experience discomfort regarding her bilateral lower extremity ulcers. She has continued to tolerate the dressing changes at this point in time and continues to have a Foley catheter as well. Fortunately she is back this week in the past she has been somewhat noncompliant with follow-ups I'm glad to see her today. She tells me that her pain level varies but can be as high as a 7 out of 10 with manipulation of the wound. She tells me that she used to be on oxycodone which was managed by the pain clinic although she is no longer on that as she tells me that she was actually smoking marijuana at the time and when this was found out they discontinued her pain medication. She no longer is taking anything pain medication wise and she also does not smoke cigarettes nor marijuana at this point. 02/12/16; this is a patient I don't believe I have previously seen. She has multiple sclerosis. She has 4 punched-out areas on the anterior lateral left leg 2 areas on the right these are all in the same condition. Reasonably small [dime size] wounds each was some depth.  Surface of these wounds does not look particularly healthy as there is adherent slough. There is no evidence of surrounding infection or inflammation. ABIs in this clinic were 0.87 on the right and 0.81 on the left. She is listed as having PAD and is a smoker. Not a diabetic 02/20/16 patient I gave antibiotics to last week for erythema around both wound sites on the left lateral leg and right lateral leg. This appears to be a lot better. One of the areas on the left leg is healed however she still has 3 punched-out open areas on the left lateral calf and one on the right lateral calf and one on the dorsal foot. She is an ex-smoker quitting 1 month ago. ABIs in this clinic were 0.87 on the right 0.81 on the left 03/12/16; this is a patient I really don't have a good sense of. It would appear for the first 5 or 6 weeks of her stay in this clinic she was cared for by Dr. Con Memos. She appeared on my schedule in mid October and I saw her twice. She has not been here however in 3 weeks. She has advanced Wound Care at home where she lives with her husband. She has multiple sclerosis. I saw her the first time she had 4 punched-out small wounds on the anterior lateral left leg it appears that she is now down to 2. She also had wounds on the lateral and medial aspect of her right leg which were also small and punched out. The only one that remains is on the right lateral. Because of the nature of her wound I went ahead and ordered formal arterial studies this showed an ABI of 1 on the right and one on the left. TBI of 1.4 on the right and 0.79 on the left. She had normal waveforms. She was in the emergency room on 02/28/16; there is  a noted edema of her left leg after a fall. She had a duplex ultrasound that showed no evidence for an acute DVT from the left groin to the popliteal fossa. The study was limited in the calf veins due to edema. Also noticeable that she had a left inguinal enlarged lymph node up to 6.2  cm. She also had a left knee x-ray that showed no acute findings and a right foot x-ray that showed soft tissue swelling but no radiographic evidence of osteomyelitis. The patient does stop smoking 03-19-16 Ms Eischeid presents today for evaluation of her bilateral lower extremity ulcerations. She states that she has not smoked in several weeks. She denies any pain or discomfort to bilateral lower extremities, tolerating compression therapy as ordered. 03/26/16; I have not seen this patient in 2 weeks however she has done very well with improvements in the areas in question. After we obtain normal arterial studies, increasing the 4-layer compression really seems to look done a nice job here. She has one open area on the left lateral leg and one on the medial right leg. These have filled in nicely and are now superficial wounds that showed epithelialized. I note the left inguinal lymph node at 6.2 cm. I may need to refer this back to the patient's primary doctor. 04/03/16; patient has 2 remaining wounds 1 on the left lateral leg and one on the right medial leg. Both of filled in nicely since we are able to increase her from a 3 layer to a 4 layer compression. I am also following up on the lymph node notable on her left leg DVT rule out in November 04/10/16; area on the right lateral leg is healed. The area on the left leg is still open but looks considerably better with healthy granulation and less wound area. I12/20/17; last week we healed the patient doubt with regards to the wound on the right leg to her on compression stocking 20-30 mmHg as it turns out I don't think she had a stocking, predictably therefore she has reopened. The left leg as well as the wound on the lateral aspect. Been generally small line 04/24/16; we healed out her right leg 2 weeks ago to her own 20-30 mm compression stockings although as it turns out she didn't have these and did not purchase some apparently because of financial  issues. The left leg wound is on the lateral aspect. Both of these wounds are just about healed. 05/01/16; her wounds on the left leg are healed out today. The area on the right leg is also healed. Vitamin diligent effort of our staff we have not been able to get any form of compression stocking to this patient. The orders for juxtalite's were given to home health this never materialized. We ordered compression stockings I don't think she was able to afford these. We had a discussion today with both the patient and her husband without these, will accumulate edema and the wounds will simply reopen again. 05/14/16 READMISSION [name], [name] ([id number]) this is a patient we healed out 2 weeks ago. As bilateral lower extremity venous wounds probably some degree of lymphedema. At the beginning in November I did a duplex ultrasound of her left leg that was negative for DVT but did show a lymph node in the left inguinal area presumably reactive. We've been using compression to her lower extremities eventually healed all her wounds on her bilateral calves but we were not able to get any form of compression stockings for  her in spite of intense efforts of our staff. She returns today with reopening on the left leg did not the right she has several superficial areas anteriorly but an actual frank ulcer on the lateral left calf. This is about the size of a dime or smaller. She does not really complain of pain. The patient has a history of PAD however arterial studies we did in November were normal. Her ABIs and Doppler waveforms were both within the normal limits. The patient has a history of MS. In discussing things with her today it would appear that the opening was noted 2 weeks ago according to the patient. We gave her Tubigrip stockings when she left the clinic 05/21/16; she has not yet had the duplex ultrasound of her thigh, this is booked for Thursday. The wound on the left lateral lower leg is  improved 05/28/16; her duplex ultrasound of the left leg specifically her left thigh did not show a DVT however did show hypoechogenic enlarged left inguinal lymph nodes up to 6.9 x 3.7 x 6.8. This also showed in the one in November however maximal diameter was 6.2, at that point I thought these were reactive secondary to cellulitis however there is obviously a differential here. Her wound on the left lower leg is closed. She has a superficial open area on the base of her left fifth metatarsal head on the plantar foot. It looks as though she has some wrap injuries on the anterior leg 06/05/16; since she was last year she had a fall on 1/31. She was seen in the ER but sent back home. I think she is also been seen by her primary physician who picked up on the swelling and the lymphadenopathy. She is ordered a CT scan of the abdomen and pelvis however this will be without contrast because of stage IIIB renal insufficiency. Nevertheless this should be a start the workup to make sure there is not is more systemic problem here. She will probably need consideration of a biopsy of the lymph node in her inguinal area, this would need general surgery. As far as her wounds are concern today the area on the left plantar foot is healed. She has a small weeping area on the left lower leg. The wrap injury from last week has largely Pardoxically there is a lot less edema in the left thigh. 06/12/16; CT scan is on Friday. She has 2 small open areas one on the lateral left lower leg and one on the anterior lower leg on the left. Both of these looks healthy. I reduced her compression last week to Kerlix and Coban this seems to have not a reasonable job 07/02/16; the patient is now in the skilled facility. I think this was arranged out of the ER when she fell at home although I'm not exactly sure. She is going for I think a laparotomy for the pelvic mass next week. All the wounds in the left leg have  healed Readmission: 07/20/2020 upon evaluation today patient presents for reevaluation here in clinic unfortunately she is having issues with a significant stage III pressure ulcer to her left heel. This is something that occurred when she was in the hospital sick. And subsequently though she does not appear to have any new injury she is still trying to recover from the pressure injury which was quite severe during that time. I do not see any signs of infection right now which is great news. With that being said she has been require  some debridement to try to clear this up as much as possible. We need to get the wound VAC cleared so that she can see overall healing and improvement that were looking for. The patient does have a history of hypertension, chronic kidney disease stage III, COPD, and multiple sclerosis. Currently she has been trying to do what she can to keep pressure off of her heel while in bed and in other locations as well. 08/04/20 upon evaluation today patient appears to be doing extremely well currently in regard to her wound on the heel. I do believe the Iodoflex has helped loosen things appear. Unfortunately she tells me that the home health nurse has not been putting the Iodoflex on the wound. That is news to [place] and that they tell me they got supplies in the mail but they did not see the Iodoflex being used. Nonetheless I am wondering if this is just something that they missed or if it truly has not been. I did tell them to look when they get home in their box of supplies to see if they saw the dressing which showed and what it should look like. 08/11/2020 upon evaluation today patient appears to be doing decently well in regard to her wound although unfortunately this is showing signs of some blue-green drainage consistent with Pseudomonas it does have a little worried in this regard. Fortunately there is no evidence of active infection which is great news. No fevers, chills,  nausea, vomiting, or diarrhea. 08/18/2020 upon evaluation today patient appears to be doing well with regard to her wound. She has been tolerating the dressing changes without complication. Fortunately there is no signs of active infection at this time. No fevers, chills, nausea, vomiting, or diarrhea. 4/29; left heel wound pressure ulcer. Slightly smaller. 09/04/2020 upon evaluation today patient appears to be doing well with regard to her wound. This is actually on her heel and seems to be doing well other than the fact that is a little bit moist from excessive drainage. There does not appear to be any signs of active infection at this time. No fevers, chills, nausea, vomiting, or diarrhea.. 09/11/2020 upon evaluation today patient appears to be doing okay in regard to her wound. With that being said she still has quite a bit of drainage noted at this point. Fortunately there is no signs of active infection which is great news overall I am extremely pleased with where things stand today. I do think we need to try to do something a little bit more to help control the drainage a bit better. 5/23; left posterior heel. This actually looks quite good. Using Hydrofera Blue under compression. Dimensions are smaller 09/26/2020 upon evaluation today patient appears to be doing well with regard to her heel ulcer. In fact this is showing signs of excellent improvement and overall I am extremely pleased with where things stand today. I do not see any evidence of infection which is great news and overall I feel like she is headed in the correct direction. 10/09/2020 upon evaluation today patient appears to be doing excellent in regard to her wound. She has been tolerating the dressing changes without complication. Fortunately there is no evidence of active infection which is also excellent news. No fevers, chills, nausea, vomiting, or diarrhea. 10/16/2020 upon evaluation today patient appears to be doing well with  regard to her heel ulcer. Unfortunately she is having issues with an area of deep tissue injury/pressure injury over the shin due to the wrap  being on way too tight today. Fortunately there does not appear to be any signs of infection currently. No fevers, chills, nausea, vomiting, or diarrhea. 10/23/2020 upon evaluation today patient appears to be doing better in regard to her wound. Fortunately there does not appear to be any signs of active infection at this time which is great news. In general I am extremely pleased with where things stand at this point. The patient does not show any signs of worsening which is excellent. She unfortunately still has the area in question with regard to the region over the anterior portion of her shin where she did have some deep tissue injury. Some of this is starting to open up a little bit. With that being said I think the biggest thing we need to do at this point is to try to as much as possible monitor for any additional breakdown and protect the region while it heals up. I think it is actually doing quite well which is great news. Fortunately there does not appear to be any evidence of active infection 11/14/2018 upon evaluation today patient appears to be doing okay in regard to her ischial. Her leg is much more swollen though she has not had a wrap on any type of dressing for some time when home health came out they did not have the Tubigrip. We have not actually seen her since June 27. The patient when I asked her about it seemed to get somewhat upset to be honest. She seems to be having some trouble at home. She states that she had to "raise hell" just to be able to get here to this appointment today. With that being said I really do think she needs to have [name], [name] H. ([id number]) this wraps and keep the swelling under control and was doing a whole lot better but now seems to be not doing quite as well to be honest. The anterior portion of her shin does  seem to be healing and doing better although it did end up opening into the wound. Again that was where home health the wrap does have a little bit too tight causing this issue previously. 11/27/2020 upon evaluation today patient appears to be doing poorly in regard to her anterior shin where she did have some area where the wrap actually aggravated the spot. With that being said I think that we do need to make sure to pad this extremely well there is already some signs of healing at this point. I did believe this was probably can open even when I saw it last week and to be honest it has opened. The heel also is showing signs of improvement I do see some slough noted but nothing too significant which is great news. No fevers, chills, nausea, vomiting, or diarrhea. 12/07/2020 upon evaluation today patient appears to be doing decently well in regard to her wounds. Both are showing signs of some slough and biofilm buildup on the surface of the wounds. Fortunately there is no signs of active infection at this time which is great news. No fevers, chills, nausea, vomiting, or diarrhea. 12/15/2020 upon evaluation today patient appears to be doing well with regard to her wounds. She is showing signs of improvement at both locations which is great news. Fortunately I do not see any evidence of active infection at this time which is great as well. 12/25/2020 upon evaluation today patient appears to be doing well with regard to her wound on the posterior heel. This seems  to be making some progress which is great news. Unfortunately the biggest issue is that of the shin which again just does not seem to be healing as effectively and quickly as I would like to see. Fortunately there does not appear to be any evidence of active infection which is great news I do believe that we can try to do something a little different using Tubigrip instead of the compression wrapping to see if this will keep pressure off of the shin  allow this to heal more effectively and quickly. 01/02/2021 upon evaluation today patient's wound on the shin actually appears to be making excellent progress which is great news and overall extremely pleased with where things stand today. There is no sign of active infection at this time. No fevers, chills, nausea, vomiting, or diarrhea. Her heel is going require some debridement but overall does not appear to be doing too poorly. 01/09/2021 upon evaluation today patient appears to be doing better in general in regard to her wounds overall. The anterior shin especially seems to be making good progress. I think we are getting close to resolution here. The heel is a little bit more concerning and that it measures a little larger. There is also some erythema surrounding. I think that we probably need to see about doing something to address this I think a culture is probably appropriate as well. 9/26; patient has 2 wound areas 1 on the left anterior shin and the other on the left Achilles heel. Using Hydrofera Blue to both of these. The patient had a culture done last time that showed Pseudomonas from the left Achilles heel. I am not certain whether she received antibiotics 02/01/2021 upon evaluation today patient appears to be doing well with regard to her wound on the shin this is completely healed. In regard to the wound on her heel this is showing signs of improvement which is good news fortunately there is no evidence of active infection which is great and overall I think you are making progress here since the beginning although it has somewhat plateaued a little bit more recently. 02/15/2021 upon evaluation today patient appears to be doing well with regard to her anterior leg ulcer this is showing signs of complete epithelization and looks good. In regard to the heel this is still open and giving her trouble. Again this is a pressure injury and fortunately I do not see any signs of infection but  unfortunately is also not significantly smaller which is not good. Overall I think that her biggest issue currently is good to be to try to do something to get this moving in the right direction I Minna check into TheraSkin is a possibility for her I think this could be of benefit. 03/05/2021 upon evaluation today patient appears to be doing a little bit more poorly in regard to her wound. She has been tolerating the dressing changes without complication. Fortunately there does not appear to be any evidence of active infection systemically at this time which is great news. No fevers, chills, nausea, vomiting, or diarrhea. Locally does appear that she probably has some cellulitis the leg is red and a little bit warm as well as the foot based on what I see today. Electronic Signature(s) Signed: 03/05/2021 3:53:26 PM By: Worthy Keeler PA-C Entered By: Worthy Keeler on 03/05/2021 15:53:26 [name], [name] ([id number]) -------------------------------------------------------------------------------- Physical Exam Details Patient Name: [name] Date of Service: 03/05/2021 2:45 PM Medical Record Number: [id number] Patient Account Number:  [id number] Date of Birth/Sex: [birth year] (70 y.o. F) Treating RN: Cornell Barman Primary Care Provider: [name] Other Clinician: Referring Provider: [name] Treating Provider/Extender: [name] in Treatment: 90 Constitutional Well-nourished and well-hydrated in no acute distress. Respiratory normal breathing without difficulty. Psychiatric this patient is able to make decisions and demonstrates good insight into disease process. Alert and Oriented x 3. pleasant and cooperative. Notes Patient's wound bed showed signs of good granulation and epithelization at this point. Fortunately there does not appear to be any evidence of active infection systemically which is great news and overall I am extremely pleased with where we stand  currently. Electronic Signature(s) Signed: 03/05/2021 3:53:43 PM By: Worthy Keeler PA-C Entered By: Worthy Keeler on 03/05/2021 15:53:43 [name] ([id number]) -------------------------------------------------------------------------------- Physician Orders Details Patient Name: [name] Date of Service: 03/05/2021 2:45 PM Medical Record Number: [id number] Patient Account Number: [id number] Date of Birth/Sex: 20-Apr-1951 (70 y.o. F) Treating RN: Cornell Barman Primary Care Provider: [name] Other Clinician: Referring Provider: [name] Treating Provider/Extender: [name] in Treatment: 64 Verbal / Phone Orders: No Diagnosis Coding ICD-10 Coding Code Description [phone] Pressure ulcer of left heel, stage 3 I87.2 Venous insufficiency (chronic) (peripheral) L97.822 Non-pressure chronic ulcer of other part of left lower leg with fat layer exposed I10 Essential (primary) hypertension N18.30 Chronic kidney disease, stage 3 unspecified J44.9 Chronic obstructive pulmonary disease, unspecified G35 Multiple sclerosis Follow-up Appointments o Return Appointment in 1 week. [place]: - [facility] [place] for wound care. May utilize formulary equivalent dressing for wound treatment orders unless otherwise specified. Home Health Nurse may visit PRN to address patientos wound care needs. o Scheduled days for dressing changes to be completed; exception, patient has scheduled wound care visit that day. - Monday, Wednesday, Friday o **Please direct any NON-WOUND related issues/requests for orders to patient's Primary Care Physician. **If current dressing causes regression in wound condition, may D/C ordered dressing product/s and apply Normal Saline Moist Dressing daily until next [place] or Other MD appointment. **Notify [facility] of regression in wound condition at [phone]. Edema  Control - Lymphedema / Segmental Compressive Device / Other Bilateral Lower Extremities o Tubigrip single layer applied. - size D o Elevate, Exercise Daily and Avoid Standing for Long Periods of Time. o Elevate legs to the level of the heart and pump ankles as often as possible - ELEVATE LEGS o Elevate leg(s) parallel to the floor when sitting. Off-Loading Left Lower Extremity o Open toe surgical shoe o Turn and reposition every 2 hours o Other: - Keep pressure off left heel Wound Treatment Wound #12 - Calcaneus Wound Laterality: Left Cleanser: Wound Cleanser (Home Health) (Generic) 2 x Per Week/30 Days Discharge Instructions: Wash your hands with soap and water. Remove old dressing, discard into plastic bag and place into trash. Cleanse the wound with Wound Cleanser prior to applying a clean dressing using gauze sponges, not tissues or cotton balls. Do not scrub or use excessive force. Pat dry using gauze sponges, not tissue or cotton balls. Primary Dressing: Hydrofera Blue Ready Transfer Foam, 2.5x2.5 (in/in) (Home Health) 2 x Per Week/30 Days Discharge Instructions: Apply Hydrofera Blue Ready to wound bed as directed Secondary Dressing: Mepilex Border Flex, 4x4 (in/in) 2 x Per Week/30 Days Discharge Instructions: Apply to wound as directed. Do not cut. Secured With: Tubigrip Size D, 3x10 (in/yd) 2 x Per Week/30 Days Patient Medications Demmer, Ithzel H. ([id number]) Allergies:  No Known Drug Allergies Notifications Medication Indication Start End Bactrim DS 03/05/2021 DOSE 1 - oral 800 mg-160 mg tablet - 1 tablet oral taken 2 times per day for 14 days Electronic Signature(s) Signed: 03/05/2021 3:55:12 PM By: Worthy Keeler PA-C Entered By: Worthy Keeler on 03/05/2021 15:55:11 [name], [name] ([id number]) -------------------------------------------------------------------------------- Problem List Details Patient Name: [name] Date of Service: 03/05/2021 2:45  PM Medical Record Number: [id number] Patient Account Number: [id number] Date of Birth/Sex: 1950/06/13 (70 y.o. F) Treating RN: Cornell Barman Primary Care Provider: [name] Other Clinician: Referring Provider: [name] Treating Provider/Extender: [name] in Treatment: 32 Active Problems ICD-10 Encounter Code Description Active Date MDM Diagnosis [phone] Pressure ulcer of left heel, stage 3 07/20/2020 No Yes I87.2 Venous insufficiency (chronic) (peripheral) 01/02/2021 No Yes L97.822 Non-pressure chronic ulcer of other part of left lower leg with fat layer 11/27/2020 No Yes exposed I10 Essential (primary) hypertension 07/20/2020 No Yes N18.30 Chronic kidney disease, stage 3 unspecified 07/20/2020 No Yes J44.9 Chronic obstructive pulmonary disease, unspecified 07/20/2020 No Yes G35 Multiple sclerosis 07/20/2020 No Yes Inactive Problems Resolved Problems Electronic Signature(s) Signed: 03/05/2021 3:28:38 PM By: Worthy Keeler PA-C Entered By: Worthy Keeler on 03/05/2021 15:28:38 [name] ([id number]) -------------------------------------------------------------------------------- Progress Note Details Patient Name: [name] Date of Service: 03/05/2021 2:45 PM Medical Record Number: [id number] Patient Account Number: [id number] Date of Birth/Sex: 02/23/1951 (70 y.o. F) Treating RN: Cornell Barman Primary Care Provider: [name] Other Clinician: Referring Provider: [name] Treating Provider/Extender: [name] in Treatment: 42 Subjective Chief Complaint Information obtained from Patient Left Heel Pressure Ulcer History of Present Illness (HPI) 70 year old with a past medical history significant for MS, urinary incontinence, and obesity. She has been seen in the wound clinic before for lower extremity ulcerations treated with compression therapy. she is also known to have hypertension, peripheral vascular disease,  COPD, obstructive sleep apnea, lumbar radiculopathy, kyphoscoliosis, urinary issues and tobacco abuse. Smokes a packet of cigarettes a day was recently seen at the [facility] for swelling of her legs and feet with a ulceration on the dorsum of the right foot which has been there for about 6 months. she was recently in the ER about a month ago where she was seen for shortness of breath and swelling of the legs and a chest x-ray was within normal limits had an increase in her BNP and was given Lasix and put on doxycycline for a mild cellulitis and possible UTI. Wounds aresmaller today 11/13/15. Debrided and will continue Santyl. 12/21/2015 -- she was admitted to the hospital overnight on 12/18/2015 and diagnosed with urinary retention and cellulitis of the left lower leg. is past to take clindamycin and use Santyl for her wounds. 01/15/2016 -- come back to see [place] for almost a month and continues to be noncompliant with her dressings 01/30/16 patient presents today for a follow-up visit concerning her ongoing bilateral lower extremity wounds. We have not seen her for the past 2 weeks although we are supposed to be seen her for weekly visits. She currently has a Foley catheter. She tells me that the wounds are intensely painful especially with pressure and palpation at this point in time. Fortunately she is having no interval signs or symptoms of systemic infection but unfortunately the wounds have not improved dramatically since we last saw her. She does have home health coming to take care of her as well. She is currently not in any compression wraps. 02/06/16 ON  evaluation today patient continues to experience discomfort regarding her bilateral lower extremity ulcers. She has continued to tolerate the dressing changes at this point in time and continues to have a Foley catheter as well. Fortunately she is back this week in the past she has been somewhat noncompliant with follow-ups I'm  glad to see her today. She tells me that her pain level varies but can be as high as a 7 out of 10 with manipulation of the wound. She tells me that she used to be on oxycodone which was managed by the pain clinic although she is no longer on that as she tells me that she was actually smoking marijuana at the time and when this was found out they discontinued her pain medication. She no longer is taking anything pain medication wise and she also does not smoke cigarettes nor marijuana at this point. 02/12/16; this is a patient I don't believe I have previously seen. She has multiple sclerosis. She has 4 punched-out areas on the anterior lateral left leg 2 areas on the right these are all in the same condition. Reasonably small [dime size] wounds each was some depth. Surface of these wounds does not look particularly healthy as there is adherent slough. There is no evidence of surrounding infection or inflammation. ABIs in this clinic were 0.87 on the right and 0.81 on the left. She is listed as having PAD and is a smoker. Not a diabetic 02/20/16 patient I gave antibiotics to last week for erythema around both wound sites on the left lateral leg and right lateral leg. This appears to be a lot better. One of the areas on the left leg is healed however she still has 3 punched-out open areas on the left lateral calf and one on the right lateral calf and one on the dorsal foot. She is an ex-smoker quitting 1 month ago. ABIs in this clinic were 0.87 on the right 0.81 on the left 03/12/16; this is a patient I really don't have a good sense of. It would appear for the first 5 or 6 weeks of her stay in this clinic she was cared for by Dr. Con Memos. She appeared on my schedule in mid October and I saw her twice. She has not been here however in 3 weeks. She has advanced Wound Care at home where she lives with her husband. She has multiple sclerosis. I saw her the first time she had 4 punched-out small wounds on  the anterior lateral left leg it appears that she is now down to 2. She also had wounds on the lateral and medial aspect of her right leg which were also small and punched out. The only one that remains is on the right lateral. Because of the nature of her wound I went ahead and ordered formal arterial studies this showed an ABI of 1 on the right and one on the left. TBI of 1.4 on the right and 0.79 on the left. She had normal waveforms. She was in the emergency room on 02/28/16; there is a noted edema of her left leg after a fall. She had a duplex ultrasound that showed no evidence for an acute DVT from the left groin to the popliteal fossa. The study was limited in the calf veins due to edema. Also noticeable that she had a left inguinal enlarged lymph node up to 6.2 cm. She also had a left knee x-ray that showed no acute findings and a right foot x-ray that showed  soft tissue swelling but no radiographic evidence of osteomyelitis. The patient does stop smoking 03-19-16 Ms [name] presents today for evaluation of her bilateral lower extremity ulcerations. She states that she has not smoked in several weeks. She denies any pain or discomfort to bilateral lower extremities, tolerating compression therapy as ordered. 03/26/16; I have not seen this patient in 2 weeks however she has done very well with improvements in the areas in question. After we obtain normal arterial studies, increasing the 4-layer compression really seems to look done a nice job here. She has one open area on the left lateral leg and one on the medial right leg. These have filled in nicely and are now superficial wounds that showed epithelialized. I note the left inguinal lymph node at 6.2 cm. I may need to refer this back to the patient's primary doctor. 04/03/16; patient has 2 remaining wounds 1 on the left lateral leg and one on the right medial leg. Both of filled in nicely since we are able to increase her from a 3 layer to a 4  layer compression. I am also following up on the lymph node notable on her left leg DVT rule out in November 04/10/16; area on the right lateral leg is healed. The area on the left leg is still open but looks considerably better with healthy granulation and less wound area. I12/20/17; last week we healed the patient doubt with regards to the wound on the right leg to her on compression stocking 20-30 mmHg as it turns out I don't think she had a stocking, predictably therefore she has reopened. The left leg as well as the wound on the lateral aspect. Been generally small line 04/24/16; we healed out her right leg 2 weeks ago to her own 20-30 mm compression stockings although as it turns out she didn't have these and did not purchase some apparently because of financial issues. The left leg wound is on the lateral aspect. Both of these wounds are just about healed. 05/01/16; her wounds on the left leg are healed out today. The area on the right leg is also healed. Vitamin diligent effort of our staff we have not been able to get any form of compression stocking to this patient. The orders for juxtalite's were given to home health this never materialized. We [name], [name] H. ([id number]) ordered compression stockings I don't think she was able to afford these. We had a discussion today with both the patient and her husband without these, will accumulate edema and the wounds will simply reopen again. 05/14/16 READMISSION this is a patient we healed out 2 weeks ago. As bilateral lower extremity venous wounds probably some degree of lymphedema. At the beginning in November I did a duplex ultrasound of her left leg that was negative for DVT but did show a lymph node in the left inguinal area presumably reactive. We've been using compression to her lower extremities eventually healed all her wounds on her bilateral calves but we were not able to get any form of compression stockings for her in spite of intense  efforts of our staff. She returns today with reopening on the left leg did not the right she has several superficial areas anteriorly but an actual frank ulcer on the lateral left calf. This is about the size of a dime or smaller. She does not really complain of pain. The patient has a history of PAD however arterial studies we did in November were normal. Her ABIs and Doppler waveforms  were both within the normal limits. The patient has a history of MS. In discussing things with her today it would appear that the opening was noted 2 weeks ago according to the patient. We gave her Tubigrip stockings when she left the clinic 05/21/16; she has not yet had the duplex ultrasound of her thigh, this is booked for Thursday. The wound on the left lateral lower leg is improved 05/28/16; her duplex ultrasound of the left leg specifically her left thigh did not show a DVT however did show hypoechogenic enlarged left inguinal lymph nodes up to 6.9 x 3.7 x 6.8. This also showed in the one in November however maximal diameter was 6.2, at that point I thought these were reactive secondary to cellulitis however there is obviously a differential here. Her wound on the left lower leg is closed. She has a superficial open area on the base of her left fifth metatarsal head on the plantar foot. It looks as though she has some wrap injuries on the anterior leg 06/05/16; since she was last year she had a fall on 1/31. She was seen in the ER but sent back home. I think she is also been seen by her primary physician who picked up on the swelling and the lymphadenopathy. She is ordered a CT scan of the abdomen and pelvis however this will be without contrast because of stage IIIB renal insufficiency. Nevertheless this should be a start the workup to make sure there is not is more systemic problem here. She will probably need consideration of a biopsy of the lymph node in her inguinal area, this would need general surgery. As far as  her wounds are concern today the area on the left plantar foot is healed. She has a small weeping area on the left lower leg. The wrap injury from last week has largely Pardoxically there is a lot less edema in the left thigh. 06/12/16; CT scan is on Friday. She has 2 small open areas one on the lateral left lower leg and one on the anterior lower leg on the left. Both of these looks healthy. I reduced her compression last week to Kerlix and Coban this seems to have not a reasonable job 07/02/16; the patient is now in the skilled facility. I think this was arranged out of the ER when she fell at home although I'm not exactly sure. She is going for I think a laparotomy for the pelvic mass next week. All the wounds in the left leg have healed Readmission: 07/20/2020 upon evaluation today patient presents for reevaluation here in clinic unfortunately she is having issues with a significant stage III pressure ulcer to her left heel. This is something that occurred when she was in the hospital sick. And subsequently though she does not appear to have any new injury she is still trying to recover from the pressure injury which was quite severe during that time. I do not see any signs of infection right now which is great news. With that being said she has been require some debridement to try to clear this up as much as possible. We need to get the wound VAC cleared so that she can see overall healing and improvement that were looking for. The patient does have a history of hypertension, chronic kidney disease stage III, COPD, and multiple sclerosis. Currently she has been trying to do what she can to keep pressure off of her heel while in bed and in other locations as well. 08/04/20  upon evaluation today patient appears to be doing extremely well currently in regard to her wound on the heel. I do believe the Iodoflex has helped loosen things appear. Unfortunately she tells me that the home health nurse has not  been putting the Iodoflex on the wound. That is news to [place] and that they tell me they got supplies in the mail but they did not see the Iodoflex being used. Nonetheless I am wondering if this is just something that they missed or if it truly has not been. I did tell them to look when they get home in their box of supplies to see if they saw the dressing which showed and what it should look like. 08/11/2020 upon evaluation today patient appears to be doing decently well in regard to her wound although unfortunately this is showing signs of some blue-green drainage consistent with Pseudomonas it does have a little worried in this regard. Fortunately there is no evidence of active infection which is great news. No fevers, chills, nausea, vomiting, or diarrhea. 08/18/2020 upon evaluation today patient appears to be doing well with regard to her wound. She has been tolerating the dressing changes without complication. Fortunately there is no signs of active infection at this time. No fevers, chills, nausea, vomiting, or diarrhea. 4/29; left heel wound pressure ulcer. Slightly smaller. 09/04/2020 upon evaluation today patient appears to be doing well with regard to her wound. This is actually on her heel and seems to be doing well other than the fact that is a little bit moist from excessive drainage. There does not appear to be any signs of active infection at this time. No fevers, chills, nausea, vomiting, or diarrhea.. 09/11/2020 upon evaluation today patient appears to be doing okay in regard to her wound. With that being said she still has quite a bit of drainage noted at this point. Fortunately there is no signs of active infection which is great news overall I am extremely pleased with where things stand today. I do think we need to try to do something a little bit more to help control the drainage a bit better. 5/23; left posterior heel. This actually looks quite good. Using Hydrofera Blue under  compression. Dimensions are smaller 09/26/2020 upon evaluation today patient appears to be doing well with regard to her heel ulcer. In fact this is showing signs of excellent improvement and overall I am extremely pleased with where things stand today. I do not see any evidence of infection which is great news and overall I feel like she is headed in the correct direction. 10/09/2020 upon evaluation today patient appears to be doing excellent in regard to her wound. She has been tolerating the dressing changes without complication. Fortunately there is no evidence of active infection which is also excellent news. No fevers, chills, nausea, vomiting, or diarrhea. 10/16/2020 upon evaluation today patient appears to be doing well with regard to her heel ulcer. Unfortunately she is having issues with an area of deep tissue injury/pressure injury over the shin due to the wrap being on way too tight today. Fortunately there does not appear to be any signs of infection currently. No fevers, chills, nausea, vomiting, or diarrhea. 10/23/2020 upon evaluation today patient appears to be doing better in regard to her wound. Fortunately there does not appear to be any signs of active infection at this time which is great news. In general I am extremely pleased with where things stand at this point. The patient does not show  any signs of worsening which is excellent. She unfortunately still has the area in question with regard to the region over the anterior portion of her shin where she did have some deep tissue injury. Some of this is starting to open up a little bit. With that being said I think the biggest thing we need to do at this point is to try to as much as possible monitor for any additional breakdown and protect the region while it heals up. I think it is actually doing quite well which is great news. Fortunately there does not appear to be any evidence of active infection [name], [name].  ([id number]) 11/14/2018 upon evaluation today patient appears to be doing okay in regard to her ischial. Her leg is much more swollen though she has not had a wrap on any type of dressing for some time when home health came out they did not have the Tubigrip. We have not actually seen her since June 27. The patient when I asked her about it seemed to get somewhat upset to be honest. She seems to be having some trouble at home. She states that she had to "raise hell" just to be able to get here to this appointment today. With that being said I really do think she needs to have this wraps and keep the swelling under control and was doing a whole lot better but now seems to be not doing quite as well to be honest. The anterior portion of her shin does seem to be healing and doing better although it did end up opening into the wound. Again that was where home health the wrap does have a little bit too tight causing this issue previously. 11/27/2020 upon evaluation today patient appears to be doing poorly in regard to her anterior shin where she did have some area where the wrap actually aggravated the spot. With that being said I think that we do need to make sure to pad this extremely well there is already some signs of healing at this point. I did believe this was probably can open even when I saw it last week and to be honest it has opened. The heel also is showing signs of improvement I do see some slough noted but nothing too significant which is great news. No fevers, chills, nausea, vomiting, or diarrhea. 12/07/2020 upon evaluation today patient appears to be doing decently well in regard to her wounds. Both are showing signs of some slough and biofilm buildup on the surface of the wounds. Fortunately there is no signs of active infection at this time which is great news. No fevers, chills, nausea, vomiting, or diarrhea. 12/15/2020 upon evaluation today patient appears to be doing well with regard to  her wounds. She is showing signs of improvement at both locations which is great news. Fortunately I do not see any evidence of active infection at this time which is great as well. 12/25/2020 upon evaluation today patient appears to be doing well with regard to her wound on the posterior heel. This seems to be making some progress which is great news. Unfortunately the biggest issue is that of the shin which again just does not seem to be healing as effectively and quickly as I would like to see. Fortunately there does not appear to be any evidence of active infection which is great news I do believe that we can try to do something a little different using Tubigrip instead of the compression wrapping to see if  this will keep pressure off of the shin allow this to heal more effectively and quickly. 01/02/2021 upon evaluation today patient's wound on the shin actually appears to be making excellent progress which is great news and overall extremely pleased with where things stand today. There is no sign of active infection at this time. No fevers, chills, nausea, vomiting, or diarrhea. Her heel is going require some debridement but overall does not appear to be doing too poorly. 01/09/2021 upon evaluation today patient appears to be doing better in general in regard to her wounds overall. The anterior shin especially seems to be making good progress. I think we are getting close to resolution here. The heel is a little bit more concerning and that it measures a little larger. There is also some erythema surrounding. I think that we probably need to see about doing something to address this I think a culture is probably appropriate as well. 9/26; patient has 2 wound areas 1 on the left anterior shin and the other on the left Achilles heel. Using Hydrofera Blue to both of these. The patient had a culture done last time that showed Pseudomonas from the left Achilles heel. I am not certain whether she received  antibiotics 02/01/2021 upon evaluation today patient appears to be doing well with regard to her wound on the shin this is completely healed. In regard to the wound on her heel this is showing signs of improvement which is good news fortunately there is no evidence of active infection which is great and overall I think you are making progress here since the beginning although it has somewhat plateaued a little bit more recently. 02/15/2021 upon evaluation today patient appears to be doing well with regard to her anterior leg ulcer this is showing signs of complete epithelization and looks good. In regard to the heel this is still open and giving her trouble. Again this is a pressure injury and fortunately I do not see any signs of infection but unfortunately is also not significantly smaller which is not good. Overall I think that her biggest issue currently is good to be to try to do something to get this moving in the right direction I Minna check into TheraSkin is a possibility for her I think this could be of benefit. 03/05/2021 upon evaluation today patient appears to be doing a little bit more poorly in regard to her wound. She has been tolerating the dressing changes without complication. Fortunately there does not appear to be any evidence of active infection systemically at this time which is great news. No fevers, chills, nausea, vomiting, or diarrhea. Locally does appear that she probably has some cellulitis the leg is red and a little bit warm as well as the foot based on what I see today. Objective Constitutional Well-nourished and well-hydrated in no acute distress. Vitals Time Taken: 3:19 PM, Height: 64 in, Weight: 265 lbs, BMI: 45.5, Temperature: 98.4 F, Pulse: 79 bpm, Respiratory Rate: 18 breaths/min, Blood Pressure: 132/74 mmHg. Respiratory normal breathing without difficulty. Psychiatric this patient is able to make decisions and demonstrates good insight into disease process.  Alert and Oriented x 3. pleasant and cooperative. General Notes: Patient's wound bed showed signs of good granulation and epithelization at this point. Fortunately there does not appear to be any [name], [name] H. ([id number]) evidence of active infection systemically which is great news and overall I am extremely pleased with where we stand currently. Integumentary (Hair, Skin) Wound #12 status is Open. Original cause  of wound was Pressure Injury. The date acquired was: 06/27/2020. The wound has been in treatment 32 weeks. The wound is located on the Left Calcaneus. The wound measures 2.1cm length x 3cm width x 0.3cm depth; 4.948cm^2 area and 1.484cm^3 volume. There is Fat Layer (Subcutaneous Tissue) exposed. There is no tunneling or undermining noted. There is a medium amount of serous drainage noted. The wound margin is thickened. There is no granulation within the wound bed. There is a large (67-100%) amount of necrotic tissue within the wound bed including Adherent Slough. Assessment Active Problems ICD-10 Pressure ulcer of left heel, stage 3 Venous insufficiency (chronic) (peripheral) Non-pressure chronic ulcer of other part of left lower leg with fat layer exposed Essential (primary) hypertension Chronic kidney disease, stage 3 unspecified Chronic obstructive pulmonary disease, unspecified Multiple sclerosis Procedures Wound #12 Pre-procedure diagnosis of Wound #12 is a Pressure Ulcer located on the Left Calcaneus . There was a Excisional Skin/Subcutaneous Tissue Debridement with a total area of 6.3 sq cm performed by Tommie Sams., PA-C. With the following instrument(s): Curette to remove Viable and Non-Viable tissue/material. Material removed includes Callus, Subcutaneous Tissue, and Slough. No specimens were taken. A time out was conducted at 15:48, prior to the start of the procedure. A Moderate amount of bleeding was controlled with Pressure. The procedure was tolerated well. Post  Debridement Measurements: 2.2cm length x 3.1cm width x 0.5cm depth; 2.678cm^3 volume. Post debridement Stage noted as Category/Stage III. Character of Wound/Ulcer Post Debridement is stable. Post procedure Diagnosis Wound #12: Same as Pre-Procedure Plan Follow-up Appointments: Return Appointment in 1 week. Home Health: [facility] for wound care. May utilize formulary equivalent dressing for wound treatment orders unless otherwise specified. Home Health Nurse may visit PRN to address patient s wound care needs. Scheduled days for dressing changes to be completed; exception, patient has scheduled wound care visit that day. - Monday, Wednesday, Friday **Please direct any NON-WOUND related issues/requests for orders to patient's Primary Care Physician. **If current dressing causes regression in wound condition, may D/C ordered dressing product/s and apply Normal Saline Moist Dressing daily until next [facility] or Other MD appointment. **Notify [facility] of regression in wound condition at [phone]. Edema Control - Lymphedema / Segmental Compressive Device / Other: Tubigrip single layer applied. - size D Elevate, Exercise Daily and Avoid Standing for Long Periods of Time. Elevate legs to the level of the heart and pump ankles as often as possible - ELEVATE LEGS Elevate leg(s) parallel to the floor when sitting. Off-Loading: Open toe surgical shoe Turn and reposition every 2 hours Other: - Keep pressure off left heel The following medication(s) was prescribed: Bactrim DS oral 800 mg-160 mg tablet 1 1 tablet oral taken 2 times per day for 14 days starting 03/05/2021 WOUND #12: - Calcaneus Wound Laterality: Left Cleanser: Wound Cleanser (Home Health) (Generic) 2 x Per Week/30 Days Discharge Instructions: Wash your hands with soap and water. Remove old dressing, discard into plastic bag and place into trash. Cleanse the wound  with Wound Cleanser prior to applying a clean dressing using gauze sponges, not tissues or cotton balls. Do not scrub or use [name], [name] H. ([id number]) excessive force. Pat dry using gauze sponges, not tissue or cotton balls. Primary Dressing: Hydrofera Blue Ready Transfer Foam, 2.5x2.5 (in/in) (Home Health) 2 x Per Week/30 Days Discharge Instructions: Apply Hydrofera Blue Ready to wound bed as directed Secondary Dressing: Mepilex Border Flex, 4x4 (in/in) 2 x  Per Week/30 Days Discharge Instructions: Apply to wound as directed. Do not cut. Secured With: Tubigrip Size D, 3x10 (in/yd) 2 x Per Week/30 Days 1. Would recommend that we go ahead and continue with the wound care measures as before and the patient is in agreement with plan. This includes the use of the [facility] which I think is probably our best option. 2. I am also can recommend that we continue with a dressing to cover followed by Tubigrip which I think will also be beneficial as far as helping with some of the edema that she has in the leg. 3. With regard to the redness and erythema I am concerned about the fact that the patient probably needs to have an oral antibiotic. She is taken Bactrim before I think this is a very good option on that and therefore go ahead and send in a prescription for the Bactrim today. We will see patient back for reevaluation in 1 week here in the clinic. If anything worsens or changes patient will contact our office for additional recommendations. Electronic Signature(s) Signed: 03/05/2021 3:55:22 PM By: Worthy Keeler PA-C Entered By: Worthy Keeler on 03/05/2021 15:55:22 [name], [name] ([id number]) -------------------------------------------------------------------------------- SuperBill Details Patient Name: [name] Date of Service: 03/05/2021 Medical Record Number: [id number] Patient Account Number: [id number] Date of Birth/Sex: October 21, 1950 (70 y.o. F) Treating RN: Cornell Barman Primary  Care Provider: [name] Other Clinician: Referring Provider: [name] Treating Provider/Extender: [name] in Treatment: 32 Diagnosis Coding ICD-10 Codes Code Description [phone] Pressure ulcer of left heel, stage 3 I87.2 Venous insufficiency (chronic) (peripheral) L97.822 Non-pressure chronic ulcer of other part of left lower leg with fat layer exposed I10 Essential (primary) hypertension N18.30 Chronic kidney disease, stage 3 unspecified J44.9 Chronic obstructive pulmonary disease, unspecified G35 Multiple sclerosis Facility Procedures CPT4 Code: [id number] Description: 11042 - DEB SUBQ TISSUE 20 SQ CM/< Modifier: Quantity: 1 CPT4 Code: Description: ICD-10 Diagnosis Description L89.623 Pressure ulcer of left heel, stage 3 Modifier: Quantity: Physician Procedures CPT4 Code: 2876811 Description: 99214 - WC PHYS LEVEL 4 - EST PT Modifier: 25 Quantity: 1 CPT4 Code: Description: ICD-10 Diagnosis Description L89.623 Pressure ulcer of left heel, stage 3 I87.2 Venous insufficiency (chronic) (peripheral) I10 Essential (primary) hypertension N18.30 Chronic kidney disease, stage 3 unspecified Modifier: Quantity: CPT4 Code: 5726203 Description: 11042 - WC PHYS SUBQ TISS 20 SQ CM Modifier: Quantity: 1 CPT4 Code: Description: ICD-10 Diagnosis Description L89.623 Pressure ulcer of left heel, stage 3 Modifier: Quantity: Electronic Signature(s) Signed: 03/05/2021 3:55:54 PM By: Worthy Keeler PA-C Entered By: Worthy Keeler on 03/05/2021 15:55:53

## 2021-03-06 NOTE — Progress Notes (Signed)
[name], [name] ([id number]) Visit Report for 03/05/2021 Arrival Information Details Patient Name: [name] Date of Service: 03/05/2021 2:45 PM Medical Record Number: [id number] Patient Account Number: [id number] Date of Birth/Sex: [birth year] (70 y.o. F) Treating [name]: [name] Primary Care [name]: [name] Other Clinician: Referring [name]: [name] Treating [name]/Extender: [name] in Treatment: 32 Visit Information History Since Last Visit Added or deleted any medications: No Patient Arrived: Wheel Chair Has Dressing in Place as Prescribed: Yes Arrival Time: 15:16 Pain Present Now: No Accompanied By: husband Transfer Assistance: None Patient Identification Verified: Yes Secondary Verification Process Completed: Yes Patient Requires Transmission-Based Precautions: No Patient Has Alerts: Yes Electronic Signature(s) Signed: 03/05/2021 5:31:02 PM By: [name], BSN, [name], CWS, [name], BSN Entered By: [name], BSN, [name], CWS, [name] on 03/05/2021 15:19:26 [name] ([id number]) -------------------------------------------------------------------------------- Encounter Discharge Information Details Patient Name: [name] Date of Service: 03/05/2021 2:45 PM Medical Record Number: [id number] Patient Account Number: [id number] Date of Birth/Sex: September 30, 1950 (70 y.o. F) Treating [name]: [name] Primary Care [name]: [name] Other Clinician: Referring [name]: [name] Treating [name]/Extender: [name] in Treatment: 32 Encounter Discharge Information Items Post Procedure Vitals Discharge Condition: Stable Temperature (F): 98.4 Ambulatory Status: Ambulatory Pulse (bpm): 79 Discharge Destination: Home Respiratory Rate (breaths/min): 16 Transportation: Private Auto Blood Pressure (mmHg): 132/79 Accompanied By: husband Schedule Follow-up Appointment: Yes Clinical Summary of Care: Electronic Signature(s) Signed:  03/05/2021 5:31:02 PM By: [name], BSN, [name], CWS, [name], BSN Entered By: [name], BSN, [name], CWS, [name] on 03/05/2021 15:59:18 [name] ([id number]) -------------------------------------------------------------------------------- Lower Extremity Assessment Details Patient Name: [name] Date of Service: 03/05/2021 2:45 PM Medical Record Number: [id number] Patient Account Number: [id number] Date of Birth/Sex: October 07, 1950 (70 y.o. F) Treating [name]: [name] Primary Care [name]: [name] Other Clinician: Referring [name]: [name] Treating [name]/Extender: [name] in Treatment: 32 Edema Assessment Assessed: [Left: Yes] Patrice [place]: No] [Left: Edema] [Right: :] Vascular Assessment Pulses: Dorsalis Pedis Palpable: [Left:Yes] Electronic Signature(s) Signed: 03/05/2021 5:31:02 PM By: [name], BSN, [name], CWS, [name], BSN Entered By: [name], BSN, [name], CWS, [name] on 03/05/2021 15:27:56 [name] ([id number]) -------------------------------------------------------------------------------- Multi Wound Chart Details Patient Name: [name] Date of Service: 03/05/2021 2:45 PM Medical Record Number: [id number] Patient Account Number: [id number] Date of Birth/Sex: February 08, 1951 (70 y.o. F) Treating [name]: [name] Primary Care Jailen Lung: [name] Other Clinician: Referring [name]: [name] Treating [name]/Extender: [name] in Treatment: 32 Vital Signs Height(in): 36 Pulse(bpm): 81 Weight(lbs): 265 Blood Pressure(mmHg): 132/74 Body Mass Index(BMI): 45 Temperature(F): 98.4 Respiratory Rate(breaths/min): 18 Photos: [N/A:N/A] Wound Location: Left Calcaneus N/A N/A Wounding Event: Pressure Injury N/A N/A Primary Etiology: Pressure Ulcer N/A N/A Comorbid History: Cataracts, Chronic Obstructive N/A N/A Pulmonary Disease (COPD), Sleep Apnea, Hypertension, Osteoarthritis, Neuropathy Date Acquired: 06/27/2020 N/A N/A Weeks of  Treatment: 32 N/A N/A Wound Status: Open N/A N/A Measurements L x W x D (cm) 2.1x3x0.3 N/A N/A Area (cm) : 4.948 N/A N/A Volume (cm) : 1.484 N/A N/A % Reduction in Area: 3.10% N/A N/A % Reduction in Volume: -45.30% N/A N/A Classification: Category/Stage III N/A N/A Exudate Amount: Medium N/A N/A Exudate Type: Serous N/A N/A Exudate Color: amber N/A N/A Wound Margin: Thickened N/A N/A Granulation Amount: None Present (0%) N/A N/A Necrotic Amount: Large (67-100%) N/A N/A Exposed Structures: Fat Layer (Subcutaneous Tissue): N/A N/A Yes Fascia: No Tendon: No Muscle: No Joint: No Bone: No Epithelialization: None N/A N/A Treatment Notes Electronic Signature(s) Signed: 03/05/2021 5:31:02 PM  By: [name], BSN, [name], CWS, [name], BSN Entered By: [name], BSN, [name], CWS, [name] on 03/05/2021 15:48:29 [name] ([id number]) -------------------------------------------------------------------------------- Multi-Disciplinary Care Plan Details Patient Name: [name] Date of Service: 03/05/2021 2:45 PM Medical Record Number: [id number] Patient Account Number: [id number] Date of Birth/Sex: 1951/03/14 (70 y.o. F) Treating [name]: [name] Primary Care [name]: [name] Other Clinician: Referring [name]: [name] Treating [name]/Extender: [name] in Treatment: 32 Active Inactive Medication Nursing Diagnoses: Knowledge deficit related to medication safety: actual or potential Goals: Patient/caregiver will demonstrate understanding of all current medications Date Initiated: 03/05/2021 Target Resolution Date: 04/04/2021 Goal Status: Active Interventions: Assess patient/caregiver ability to manage medication regimen upon admission and as needed Notes: Necrotic Tissue Nursing Diagnoses: Impaired tissue integrity related to necrotic/devitalized tissue Knowledge deficit related to management of necrotic/devitalized tissue Goals: Necrotic/devitalized tissue  will be minimized in the wound bed Date Initiated: 03/05/2021 Target Resolution Date: 03/12/2021 Goal Status: Active Patient/caregiver will verbalize understanding of reason and process for debridement of necrotic tissue Date Initiated: 03/05/2021 Target Resolution Date: 03/05/2021 Goal Status: Active Interventions: Assess patient pain level pre-, during and post procedure and prior to discharge Provide education on necrotic tissue and debridement process Treatment Activities: Excisional debridement : 03/05/2021 Notes: Electronic Signature(s) Signed: 03/05/2021 5:31:02 PM By: [name], BSN, [name], CWS, [name], BSN Entered By: [name], BSN, [name], CWS, [name] on 03/05/2021 15:48:21 [name], [name] ([id number]) -------------------------------------------------------------------------------- Pain Assessment Details Patient Name: [name] Date of Service: 03/05/2021 2:45 PM Medical Record Number: [id number] Patient Account Number: [id number] Date of Birth/Sex: 07-Sep-1950 (70 y.o. F) Treating [name]: [name] Primary Care Buffi Ewton: [name] Other Clinician: Referring Svetlana Bagby: [name] Treating Tran Randle/Extender: [name] in Treatment: 32 Active Problems Location of Pain Severity and Description of Pain Patient Has Paino No Site Locations Pain Management and Medication Current Pain Management: Notes Patient denies pain in the wound area Electronic Signature(s) Signed: 03/05/2021 5:31:02 PM By: [name], BSN, [name], CWS, [name], BSN Entered By: [name], BSN, [name], CWS, [name] on 03/05/2021 15:20:12 [name] ([id number]) -------------------------------------------------------------------------------- Patient/Caregiver Education Details Patient Name: [name] Date of Service: 03/05/2021 2:45 PM Medical Record Number: [id number] Patient Account Number: [id number] Date of Birth/Gender: Aug 17, 1950 (70 y.o. F) Treating [name]: [name] Primary Care Physician: [name]  Other Clinician: Referring Physician: Frazier [name] Treating Physician/Extender: [name] in Treatment: 46 Education Assessment Education Provided To: Patient Education Topics Provided Wound Debridement: Handouts: Wound Debridement Methods: Demonstration, Explain/Verbal Responses: State content correctly Wound/Skin Impairment: Handouts: Caring for Your Ulcer Methods: Demonstration, Explain/Verbal Responses: State content correctly Electronic Signature(s) Signed: 03/05/2021 5:31:02 PM By: [name], BSN, [name], CWS, [name], BSN Entered By: [name], BSN, [name], CWS, [name] on 03/05/2021 15:56:41 [name] ([id number]) -------------------------------------------------------------------------------- Wound Assessment Details Patient Name: [name] Date of Service: 03/05/2021 2:45 PM Medical Record Number: [id number] Patient Account Number: [id number] Date of Birth/Sex: 1950/06/21 (70 y.o. F) Treating [name]: [name] Primary Care Shuntia Exton: [name] Other Clinician: Referring Carol Loftin: [name] Treating Franny Selvage/Extender: [name] in Treatment: 32 Wound Status Wound Number: 12 Primary Pressure Ulcer Etiology: Wound Location: Left Calcaneus Wound Open Wounding Event: Pressure Injury Status: Date Acquired: 06/27/2020 Comorbid Cataracts, Chronic Obstructive Pulmonary Disease Weeks Of Treatment: 32 History: (COPD), Sleep Apnea, Hypertension, Osteoarthritis, Clustered Wound: No Neuropathy Photos Wound Measurements Length: (cm) 2.1 Width: (cm) 3 Depth: (cm) 0.3 Area: (cm) 4.948 Volume: (cm) 1.484 % Reduction in Area: 3.1% % Reduction in Volume: -45.3% Epithelialization: None Tunneling: No  Undermining: No Wound Description Classification: Category/Stage III Wound Margin: Thickened Exudate Amount: Medium Exudate Type: Serous Exudate Color: amber Foul Odor After Cleansing: No Slough/Fibrino Yes Wound Bed Granulation Amount: None  Present (0%) Exposed Structure Necrotic Amount: Large (67-100%) Fascia Exposed: No Necrotic Quality: Adherent Slough Fat Layer (Subcutaneous Tissue) Exposed: Yes Tendon Exposed: No Muscle Exposed: No Joint Exposed: No Bone Exposed: No Treatment Notes Wound #12 (Calcaneus) Wound Laterality: Left Cleanser Wound Cleanser Discharge Instruction: Wash your hands with soap and water. Remove old dressing, discard into plastic bag and place into trash. Cleanse the wound with Wound Cleanser prior to applying a clean dressing using gauze sponges, not tissues or cotton balls. Do not [name], [name] H. ([id number]) scrub or use excessive force. Pat dry using gauze sponges, not tissue or cotton balls. Peri-Wound Care Topical Primary Dressing Hydrofera Blue Ready Transfer Foam, 2.5x2.5 (in/in) Discharge Instruction: Apply Hydrofera Blue Ready to wound bed as directed Secondary Dressing Mepilex Border Flex, 4x4 (in/in) Discharge Instruction: Apply to wound as directed. Do not cut. Secured With Tubigrip Size D, 3x10 (in/yd) Compression Wrap Compression Stockings Add-Ons Electronic Signature(s) Signed: 03/05/2021 5:31:02 PM By: [name], BSN, [name], CWS, [name], BSN Entered By: [name], BSN, [name], CWS, [name] on 03/05/2021 15:27:09 LENOX, LADOUCEUR ([id number]) -------------------------------------------------------------------------------- Vitals Details Patient Name: [name] Date of Service: 03/05/2021 2:45 PM Medical Record Number: [id number] Patient Account Number: [id number] Date of Birth/Sex: [birth year] (70 y.o. F) Treating [name]: [name] Primary Care Campbell Kray: [name] Other Clinician: Referring Neko Mcgeehan: [name] Treating Josedejesus Marcum/Extender: [name] in Treatment: 32 Vital Signs Time Taken: 15:19 Temperature (F): 98.4 Height (in): 64 Pulse (bpm): 79 Weight (lbs): 265 Respiratory Rate (breaths/min): 18 Body Mass Index (BMI): 45.5 Blood Pressure (mmHg):  132/74 Reference Range: 80 - 120 mg / dl Electronic Signature(s) Signed: 03/05/2021 5:31:02 PM By: [name], BSN, [name], CWS, [name], BSN Entered By: [name], BSN, [name], CWS, [name] on 03/05/2021 15:19:52

## 2021-03-12 ENCOUNTER — Encounter: Payer: Medicare Other | Admitting: Physician Assistant

## 2021-03-13 ENCOUNTER — Ambulatory Visit: Payer: Medicaid Other | Admitting: Physician Assistant

## 2021-03-19 ENCOUNTER — Ambulatory Visit: Payer: Medicaid Other | Admitting: Physician Assistant

## 2021-03-20 ENCOUNTER — Encounter: Payer: Medicare Other | Admitting: Physician Assistant

## 2021-03-20 ENCOUNTER — Other Ambulatory Visit: Payer: Self-pay

## 2021-03-20 DIAGNOSIS — L03116 Cellulitis of left lower limb: Secondary | ICD-10-CM | POA: Diagnosis not present

## 2021-03-20 DIAGNOSIS — N179 Acute kidney failure, unspecified: Secondary | ICD-10-CM | POA: Diagnosis not present

## 2021-03-20 NOTE — Progress Notes (Addendum)
[name], [name] ([id number]) Visit Report for 03/20/2021 Arrival Information Details Patient Name: [name] Date of Service: 03/20/2021 12:30 PM Medical Record Number: [id number] Patient Account Number: [id number] Date of Birth/Sex: 10-01-1950 (70 y.o. F) Treating RN: [name] Primary Care [name]: [name] Other Clinician: Referring Jahmad Petrich: [name] Treating Camryn Lampson/Extender: [name] in Treatment: 21 Visit Information History Since Last Visit Added or deleted any medications: No Patient Arrived: Wheel Chair Has Dressing in Place as Prescribed: Yes Arrival Time: 12:53 Has Compression in Place as Prescribed: Yes Accompanied By: husband Has Footwear/Offloading in Place as Prescribed: Yes Transfer Assistance: Manual Left: Surgical Shoe with Pressure Relief Patient Identification Verified: Yes Insole Secondary Verification Process Completed: Yes Pain Present Now: No Patient Requires Transmission-Based Precautions: No Patient Has Alerts: Yes Electronic Signature(s) Signed: 03/20/2021 4:45:38 PM By: [name], BSN, RN, CWS, [name] Entered By: [name], BSN, RN, CWS, [name] on 03/20/2021 12:54:38 [name] ([id number]) -------------------------------------------------------------------------------- Encounter Discharge Information Details Patient Name: [name] Date of Service: 03/20/2021 12:30 PM Medical Record Number: [id number] Patient Account Number: [id number] Date of Birth/Sex: 1950-05-10 (70 y.o. F) Treating RN: [name] Primary Care [name]: [name] Other Clinician: Referring [name]: [name] Treating [name]/Extender: [name] in Treatment: 34 Encounter Discharge Information Items Post Procedure Vitals Discharge Condition: Stable Unable to obtain vitals Reason: . Ambulatory Status: Wheelchair Discharge Destination: Home Transportation: Private Auto Accompanied By: husband Schedule  Follow-up Appointment: No Clinical Summary of Care: Electronic Signature(s) Signed: 03/20/2021 4:45:38 PM By: [name], BSN, RN, CWS, [name] Entered By: [name], BSN, RN, CWS, [name] on 03/20/2021 13:55:08 [name] ([id number]) -------------------------------------------------------------------------------- Lower Extremity Assessment Details Patient Name: [name] Date of Service: 03/20/2021 12:30 PM Medical Record Number: [id number] Patient Account Number: [id number] Date of Birth/Sex: 05-Jan-1951 (70 y.o. F) Treating RN: [name] Primary Care [name]: [name] Other Clinician: Referring Ayde Record: [name] Treating [name]/Extender: [name] in Treatment: 34 Vascular Assessment Pulses: Dorsalis Pedis Palpable: [Left:Yes] Electronic Signature(s) Signed: 03/20/2021 4:45:38 PM By: [name], BSN, RN, CWS, [name] Entered By: [name], BSN, RN, CWS, [name] on 03/20/2021 13:02:57 [name] ([id number]) -------------------------------------------------------------------------------- Multi Wound Chart Details Patient Name: [name] Date of Service: 03/20/2021 12:30 PM Medical Record Number: [id number] Patient Account Number: [id number] Date of Birth/Sex: 22-Feb-1951 (70 y.o. F) Treating RN: [name] Primary Care [name]: [name] Other Clinician: Referring Ermie Glendenning: [name] Treating [name]/Extender: [name] in Treatment: 34 Vital Signs Height(in): 76 Pulse(bpm): 66 Weight(lbs): 265 Blood Pressure(mmHg): 148/61 Body Mass Index(BMI): 45 Temperature(F): 98.2 Respiratory Rate(breaths/min): 18 Photos: [N/A:N/A] Wound Location: Left Calcaneus N/A N/A Wounding Event: Pressure Injury N/A N/A Primary Etiology: Pressure Ulcer N/A N/A Comorbid History: Cataracts, Chronic Obstructive N/A N/A Pulmonary Disease (COPD), Sleep Apnea, Hypertension, Osteoarthritis, Neuropathy Date Acquired: 06/27/2020 N/A  N/A Weeks of Treatment: 34 N/A N/A Wound Status: Open N/A N/A Measurements L x W x D (cm) 3.5x4.5x0.3 N/A N/A Area (cm) : 12.37 N/A N/A Volume (cm) : 3.711 N/A N/A % Reduction in Area: -142.30% N/A N/A % Reduction in Volume: -263.50% N/A N/A Classification: Category/Stage III N/A N/A Exudate Amount: Medium N/A N/A Exudate Type: Serous N/A N/A Exudate Color: amber N/A N/A Wound Margin: Thickened N/A N/A Granulation Amount: None Present (0%) N/A N/A Necrotic Amount: Large (67-100%) N/A N/A Necrotic Tissue: Eschar, Adherent Slough N/A N/A Exposed Structures: Fat Layer (Subcutaneous Tissue): N/A N/A Yes Fascia: No Tendon: No Muscle: No Joint: No Bone: No Epithelialization: None N/A  N/A Treatment Notes Electronic Signature(s) Signed: 03/20/2021 4:45:38 PM By: [name], BSN, RN, CWS, [name] Entered By: [name], BSN, RN, CWS, [name] on 03/20/2021 13:04:16 [name] ([id number]) -------------------------------------------------------------------------------- Multi-Disciplinary Care Plan Details Patient Name: [name] Date of Service: 03/20/2021 12:30 PM Medical Record Number: [id number] Patient Account Number: [id number] Date of Birth/Sex: [birth year] (70 y.o. F) Treating RN: [name] Primary Care [name]: [name] Other Clinician: Referring [name]: [name] Treating [name]/Extender: [name] in Treatment: 72 Active Inactive Electronic Signature(s) Signed: 04/06/2021 10:40:39 AM By: [name], BSN, RN, CWS, [name] Previous Signature: 03/20/2021 4:45:38 PM Version By: [name], BSN, RN, CWS, [name] Entered By: [name], BSN, RN, CWS, [name] on 04/06/2021 10:40:38 [name] ([id number]) -------------------------------------------------------------------------------- Pain Assessment Details Patient Name: [name] Date of Service: 03/20/2021 12:30 PM Medical Record Number: [id number] Patient Account Number: [id number] Date of  Birth/Sex: 1950/10/18 (70 y.o. F) Treating RN: [name] Primary Care Brealyn Baril: [name] Other Clinician: Referring Vonna Brabson: [name] Treating Nika Yazzie/Extender: [name] in Treatment: 34 Active Problems Location of Pain Severity and Description of Pain Patient Has Paino Yes Site Locations Pain Management and Medication Current Pain Management: Notes Minimal pain from wound area. Electronic Signature(s) Signed: 03/20/2021 4:45:38 PM By: [name], BSN, RN, CWS, [name] Entered By: [name], BSN, RN, CWS, [name] on 03/20/2021 12:55:19 [name] ([id number]) -------------------------------------------------------------------------------- Patient/Caregiver Education Details Patient Name: [name] Date of Service: 03/20/2021 12:30 PM Medical Record Number: [id number] Patient Account Number: [id number] Date of Birth/Gender: Mar 18, 1951 (70 y.o. F) Treating RN: [name] Primary Care Physician: [name] Other Clinician: Referring Physician: Frazier [name] Treating Physician/Extender: [name] in Treatment: 18 Education Assessment Education Provided To: Patient Education Topics Provided Wound Debridement: Handouts: Wound Debridement Methods: Demonstration, Explain/Verbal Responses: State content correctly Wound/Skin Impairment: Handouts: Caring for Your Ulcer Methods: Demonstration, Explain/Verbal Responses: State content correctly Electronic Signature(s) Signed: 03/20/2021 4:45:38 PM By: [name], BSN, RN, CWS, [name] Entered By: [name], BSN, RN, CWS, [name] on 03/20/2021 13:54:10 [name] ([id number]) -------------------------------------------------------------------------------- Wound Assessment Details Patient Name: [name] Date of Service: 03/20/2021 12:30 PM Medical Record Number: [id number] Patient Account Number: [id number] Date of Birth/Sex: [birth year] (70 y.o. F) Treating RN: [name] Primary  Care Sybol Morre: [name] Other Clinician: Referring Lucca Ballo: [name] Treating Wing Schoch/Extender: [name] in Treatment: 34 Wound Status Wound Number: 12 Primary Pressure Ulcer Etiology: Wound Location: Left Calcaneus Wound Open Wounding Event: Pressure Injury Status: Date Acquired: 06/27/2020 Comorbid Cataracts, Chronic Obstructive Pulmonary Disease Weeks Of Treatment: 34 History: (COPD), Sleep Apnea, Hypertension, Osteoarthritis, Clustered Wound: No Neuropathy Photos Wound Measurements Length: (cm) 3.5 Width: (cm) 4.5 Depth: (cm) 0.3 Area: (cm) 12.37 Volume: (cm) 3.711 % Reduction in Area: -142.3% % Reduction in Volume: -263.5% Epithelialization: None Tunneling: No Undermining: No Wound Description Classification: Unstageable/Unclassified Wound Margin: Thickened Exudate Amount: Medium Exudate Type: Serous Exudate Color: amber Foul Odor After Cleansing: No Slough/Fibrino Yes Wound Bed Granulation Amount: None Present (0%) Exposed Structure Necrotic Amount: Large (67-100%) Fascia Exposed: No Necrotic Quality: Eschar, Adherent Slough Fat Layer (Subcutaneous Tissue) Exposed: Yes Tendon Exposed: No Muscle Exposed: No Joint Exposed: No Bone Exposed: No Electronic Signature(s) Signed: 03/20/2021 4:45:38 PM By: [name], BSN, RN, CWS, [name] Entered By: [name], BSN, RN, CWS, [name] on 03/20/2021 13:34:56 Hughley, [name] ([id number]) -------------------------------------------------------------------------------- Vitals Details Patient Name: [name] Date of Service: 03/20/2021 12:30 PM Medical Record Number: [id number] Patient Account Number: [id number] Date  of Birth/Sex: 10/20/1950 (70 y.o. F) Treating RN: [name] Primary Care Wahneta Derocher: [name] Other Clinician: Referring Telecia Larocque: [name] Treating Peri Kreft/Extender: [name] in Treatment: 34 Vital Signs Time Taken: 12:54 Temperature (F):  98.2 Height (in): 64 Pulse (bpm): 80 Weight (lbs): 265 Respiratory Rate (breaths/min): 18 Body Mass Index (BMI): 45.5 Blood Pressure (mmHg): 148/61 Reference Range: 80 - 120 mg / dl Electronic Signature(s) Signed: 03/20/2021 4:45:38 PM By: [name], BSN, RN, CWS, [name] Entered By: [name], BSN, RN, CWS, [name] on 03/20/2021 12:55:01

## 2021-03-20 NOTE — Progress Notes (Addendum)
[name] ([id number]) Visit Report for 03/20/2021 Chief Complaint Document Details Patient Name: [name], [name] Date of Service: 03/20/2021 12:30 PM Medical Record Number: [id number] Patient Account Number: [id number] Date of Birth/Sex: 1951/03/30 (69 y.o. F) Treating RN: Cornell Barman Primary Care Provider: [name] Other Clinician: Referring Provider: [name] Treating Provider/Extender: [name] in Treatment: 34 Information Obtained from: Patient Chief Complaint Left Heel Pressure Ulcer Electronic Signature(s) Signed: 03/20/2021 1:06:39 PM By: Worthy Keeler PA-C Entered By: Worthy Keeler on 03/20/2021 13:06:39 [name] ([id number]) -------------------------------------------------------------------------------- Debridement Details Patient Name: [name] Date of Service: 03/20/2021 12:30 PM Medical Record Number: [id number] Patient Account Number: [id number] Date of Birth/Sex: 04-06-1951 (70 y.o. F) Treating RN: Cornell Barman Primary Care Provider: [name] Other Clinician: Referring Provider: [name] Treating Provider/Extender: [name] in Treatment: 34 Debridement Performed for Wound #12 Left Calcaneus Assessment: Performed By: Physician Tommie Sams., PA-C Debridement Type: Debridement Level of Consciousness (Pre- Awake and Alert procedure): Pre-procedure Verification/Time Out Yes - 13:33 Taken: Total Area Debrided (L x W): 3.5 (cm) x 4.5 (cm) = 15.75 (cm) Tissue and other material Non-Viable, Eschar, Slough, Subcutaneous, Fibrin/Exudate, Slough debrided: Level: Skin/Subcutaneous Tissue Debridement Description: Excisional Instrument: Curette Bleeding: Minimum Hemostasis Achieved: Pressure Response to Treatment: Procedure was tolerated well Level of Consciousness (Post- Awake and Alert procedure): Post Debridement Measurements of Total Wound Length: (cm) 3.5 Stage:  Unstageable/Unclassified Width: (cm) 4.5 Depth: (cm) 0.3 Volume: (cm) 3.711 Character of Wound/Ulcer Post Debridement: Stable Post Procedure Diagnosis Same as Pre-procedure Electronic Signature(s) Signed: 03/20/2021 4:45:38 PM By: Gretta Cool, BSN, RN, CWS, Kim RN, BSN Signed: 03/20/2021 5:02:50 PM By: Worthy Keeler PA-C Entered By: Gretta Cool, BSN, RN, CWS, Kim on 03/20/2021 13:34:33 Senters, [name] ([id number]) -------------------------------------------------------------------------------- HPI Details Patient Name: [name] Date of Service: 03/20/2021 12:30 PM Medical Record Number: [id number] Patient Account Number: [id number] Date of Birth/Sex: May 27, 1950 (70 y.o. F) Treating RN: Cornell Barman Primary Care Provider: [name] Other Clinician: Referring Provider: [name] Treating Provider/Extender: [name] in Treatment: 58 History of Present Illness HPI Description: 70 year old with a past medical history significant for MS, urinary incontinence, and obesity. She has been seen in the wound clinic before for lower extremity ulcerations treated with compression therapy. she is also known to have hypertension, peripheral vascular disease, COPD, obstructive sleep apnea, lumbar radiculopathy, kyphoscoliosis, urinary issues and tobacco abuse. Smokes a packet of cigarettes a day was recently seen at the [facility] for swelling of her legs and feet with a ulceration on the dorsum of the right foot which has been there for about 6 months. she was recently in the ER about a month ago where she was seen for shortness of breath and swelling of the legs and a chest x-ray was within normal limits had an increase in her BNP and was given Lasix and put on doxycycline for a mild cellulitis and possible UTI. Wounds aresmaller today 11/13/15. Debrided and will continue Santyl. 12/21/2015 -- she was admitted to the hospital overnight on 12/18/2015 and diagnosed  with urinary retention and cellulitis of the left lower leg. is past to take clindamycin and use Santyl for her wounds. 01/15/2016 -- come back to see [place] for almost a month and continues to be noncompliant with her dressings 01/30/16 patient presents today for a follow-up visit concerning her ongoing bilateral lower extremity wounds. We have not seen her for the past 2 weeks although we are supposed to be seen her  for weekly visits. She currently has a Foley catheter. She tells me that the wounds are intensely painful especially with pressure and palpation at this point in time. Fortunately she is having no interval signs or symptoms of systemic infection but unfortunately the wounds have not improved dramatically since we last saw her. She does have home health coming to take care of her as well. She is currently not in any compression wraps. 02/06/16 ON evaluation today patient continues to experience discomfort regarding her bilateral lower extremity ulcers. She has continued to tolerate the dressing changes at this point in time and continues to have a Foley catheter as well. Fortunately she is back this week in the past she has been somewhat noncompliant with follow-ups I'm glad to see her today. She tells me that her pain level varies but can be as high as a 7 out of 10 with manipulation of the wound. She tells me that she used to be on oxycodone which was managed by the pain clinic although she is no longer on that as she tells me that she was actually smoking marijuana at the time and when this was found out they discontinued her pain medication. She no longer is taking anything pain medication wise and she also does not smoke cigarettes nor marijuana at this point. 02/12/16; this is a patient I don't believe I have previously seen. She has multiple sclerosis. She has 4 punched-out areas on the anterior lateral left leg 2 areas on the right these are all in the same condition. Reasonably small  [dime size] wounds each was some depth. Surface of these wounds does not look particularly healthy as there is adherent slough. There is no evidence of surrounding infection or inflammation. ABIs in this clinic were 0.87 on the right and 0.81 on the left. She is listed as having PAD and is a smoker. Not a diabetic 02/20/16 patient I gave antibiotics to last week for erythema around both wound sites on the left lateral leg and right lateral leg. This appears to be a lot better. One of the areas on the left leg is healed however she still has 3 punched-out open areas on the left lateral calf and one on the right lateral calf and one on the dorsal foot. She is an ex-smoker quitting 1 month ago. ABIs in this clinic were 0.87 on the right 0.81 on the left 03/12/16; this is a patient I really don't have a good sense of. It would appear for the first 5 or 6 weeks of her stay in this clinic she was cared for by Dr. Con Memos. She appeared on my schedule in mid October and I saw her twice. She has not been here however in 3 weeks. She has advanced Wound Care at home where she lives with her husband. She has multiple sclerosis. I saw her the first time she had 4 punched-out small wounds on the anterior lateral left leg it appears that she is now down to 2. She also had wounds on the lateral and medial aspect of her right leg which were also small and punched out. The only one that remains is on the right lateral. Because of the nature of her wound I went ahead and ordered formal arterial studies this showed an ABI of 1 on the right and one on the left. TBI of 1.4 on the right and 0.79 on the left. She had normal waveforms. She was in the emergency room on 02/28/16; there is a  noted edema of her left leg after a fall. She had a duplex ultrasound that showed no evidence for an acute DVT from the left groin to the popliteal fossa. The study was limited in the calf veins due to edema. Also noticeable that she had a left  inguinal enlarged lymph node up to 6.2 cm. She also had a left knee x-ray that showed no acute findings and a right foot x-ray that showed soft tissue swelling but no radiographic evidence of osteomyelitis. The patient does stop smoking 03-19-16 Ms Hege presents today for evaluation of her bilateral lower extremity ulcerations. She states that she has not smoked in several weeks. She denies any pain or discomfort to bilateral lower extremities, tolerating compression therapy as ordered. 03/26/16; I have not seen this patient in 2 weeks however she has done very well with improvements in the areas in question. After we obtain normal arterial studies, increasing the 4-layer compression really seems to look done a nice job here. She has one open area on the left lateral leg and one on the medial right leg. These have filled in nicely and are now superficial wounds that showed epithelialized. I note the left inguinal lymph node at 6.2 cm. I may need to refer this back to the patient's primary doctor. 04/03/16; patient has 2 remaining wounds 1 on the left lateral leg and one on the right medial leg. Both of filled in nicely since we are able to increase her from a 3 layer to a 4 layer compression. I am also following up on the lymph node notable on her left leg DVT rule out in November 04/10/16; area on the right lateral leg is healed. The area on the left leg is still open but looks considerably better with healthy granulation and less wound area. I12/20/17; last week we healed the patient doubt with regards to the wound on the right leg to her on compression stocking 20-30 mmHg as it turns out I don't think she had a stocking, predictably therefore she has reopened. The left leg as well as the wound on the lateral aspect. Been generally small line 04/24/16; we healed out her right leg 2 weeks ago to her own 20-30 mm compression stockings although as it turns out she didn't have these and did not  purchase some apparently because of financial issues. The left leg wound is on the lateral aspect. Both of these wounds are just about healed. 05/01/16; her wounds on the left leg are healed out today. The area on the right leg is also healed. Vitamin diligent effort of our staff we have not been able to get any form of compression stocking to this patient. The orders for juxtalite's were given to home health this never materialized. We ordered compression stockings I don't think she was able to afford these. We had a discussion today with both the patient and her husband without these, will accumulate edema and the wounds will simply reopen again. 05/14/16 READMISSION Carrera, [place] ([id number]) this is a patient we healed out 2 weeks ago. As bilateral lower extremity venous wounds probably some degree of lymphedema. At the beginning in November I did a duplex ultrasound of her left leg that was negative for DVT but did show a lymph node in the left inguinal area presumably reactive. We've been using compression to her lower extremities eventually healed all her wounds on her bilateral calves but we were not able to get any form of compression stockings for her  in spite of intense efforts of our staff. She returns today with reopening on the left leg did not the right she has several superficial areas anteriorly but an actual frank ulcer on the lateral left calf. This is about the size of a dime or smaller. She does not really complain of pain. The patient has a history of PAD however arterial studies we did in November were normal. Her ABIs and Doppler waveforms were both within the normal limits. The patient has a history of MS. In discussing things with her today it would appear that the opening was noted 2 weeks ago according to the patient. We gave her Tubigrip stockings when she left the clinic 05/21/16; she has not yet had the duplex ultrasound of her thigh, this is booked for Thursday. The wound  on the left lateral lower leg is improved 05/28/16; her duplex ultrasound of the left leg specifically her left thigh did not show a DVT however did show hypoechogenic enlarged left inguinal lymph nodes up to 6.9 x 3.7 x 6.8. This also showed in the one in November however maximal diameter was 6.2, at that point I thought these were reactive secondary to cellulitis however there is obviously a differential here. Her wound on the left lower leg is closed. She has a superficial open area on the base of her left fifth metatarsal head on the plantar foot. It looks as though she has some wrap injuries on the anterior leg 06/05/16; since she was last year she had a fall on 1/31. She was seen in the ER but sent back home. I think she is also been seen by her primary physician who picked up on the swelling and the lymphadenopathy. She is ordered a CT scan of the abdomen and pelvis however this will be without contrast because of stage IIIB renal insufficiency. Nevertheless this should be a start the workup to make sure there is not is more systemic problem here. She will probably need consideration of a biopsy of the lymph node in her inguinal area, this would need general surgery. As far as her wounds are concern today the area on the left plantar foot is healed. She has a small weeping area on the left lower leg. The wrap injury from last week has largely Pardoxically there is a lot less edema in the left thigh. 06/12/16; CT scan is on Friday. She has 2 small open areas one on the lateral left lower leg and one on the anterior lower leg on the left. Both of these looks healthy. I reduced her compression last week to Kerlix and Coban this seems to have not a reasonable job 07/02/16; the patient is now in the skilled facility. I think this was arranged out of the ER when she fell at home although I'm not exactly sure. She is going for I think a laparotomy for the pelvic mass next week. All the wounds in the left leg  have healed Readmission: 07/20/2020 upon evaluation today patient presents for reevaluation here in clinic unfortunately she is having issues with a significant stage III pressure ulcer to her left heel. This is something that occurred when she was in the hospital sick. And subsequently though she does not appear to have any new injury she is still trying to recover from the pressure injury which was quite severe during that time. I do not see any signs of infection right now which is great news. With that being said she has been require some  debridement to try to clear this up as much as possible. We need to get the wound VAC cleared so that she can see overall healing and improvement that were looking for. The patient does have a history of hypertension, chronic kidney disease stage III, COPD, and multiple sclerosis. Currently she has been trying to do what she can to keep pressure off of her heel while in bed and in other locations as well. 08/04/20 upon evaluation today patient appears to be doing extremely well currently in regard to her wound on the heel. I do believe the Iodoflex has helped loosen things appear. Unfortunately she tells me that the home health nurse has not been putting the Iodoflex on the wound. That is news to [place] and that they tell me they got supplies in the mail but they did not see the Iodoflex being used. Nonetheless I am wondering if this is just something that they missed or if it truly has not been. I did tell them to look when they get home in their box of supplies to see if they saw the dressing which showed and what it should look like. 08/11/2020 upon evaluation today patient appears to be doing decently well in regard to her wound although unfortunately this is showing signs of some blue-green drainage consistent with Pseudomonas it does have a little worried in this regard. Fortunately there is no evidence of active infection which is great news. No fevers, chills,  nausea, vomiting, or diarrhea. 08/18/2020 upon evaluation today patient appears to be doing well with regard to her wound. She has been tolerating the dressing changes without complication. Fortunately there is no signs of active infection at this time. No fevers, chills, nausea, vomiting, or diarrhea. 4/29; left heel wound pressure ulcer. Slightly smaller. 09/04/2020 upon evaluation today patient appears to be doing well with regard to her wound. This is actually on her heel and seems to be doing well other than the fact that is a little bit moist from excessive drainage. There does not appear to be any signs of active infection at this time. No fevers, chills, nausea, vomiting, or diarrhea.. 09/11/2020 upon evaluation today patient appears to be doing okay in regard to her wound. With that being said she still has quite a bit of drainage noted at this point. Fortunately there is no signs of active infection which is great news overall I am extremely pleased with where things stand today. I do think we need to try to do something a little bit more to help control the drainage a bit better. 5/23; left posterior heel. This actually looks quite good. Using Hydrofera Blue under compression. Dimensions are smaller 09/26/2020 upon evaluation today patient appears to be doing well with regard to her heel ulcer. In fact this is showing signs of excellent improvement and overall I am extremely pleased with where things stand today. I do not see any evidence of infection which is great news and overall I feel like she is headed in the correct direction. 10/09/2020 upon evaluation today patient appears to be doing excellent in regard to her wound. She has been tolerating the dressing changes without complication. Fortunately there is no evidence of active infection which is also excellent news. No fevers, chills, nausea, vomiting, or diarrhea. 10/16/2020 upon evaluation today patient appears to be doing well with  regard to her heel ulcer. Unfortunately she is having issues with an area of deep tissue injury/pressure injury over the shin due to the wrap being  on way too tight today. Fortunately there does not appear to be any signs of infection currently. No fevers, chills, nausea, vomiting, or diarrhea. 10/23/2020 upon evaluation today patient appears to be doing better in regard to her wound. Fortunately there does not appear to be any signs of active infection at this time which is great news. In general I am extremely pleased with where things stand at this point. The patient does not show any signs of worsening which is excellent. She unfortunately still has the area in question with regard to the region over the anterior portion of her shin where she did have some deep tissue injury. Some of this is starting to open up a little bit. With that being said I think the biggest thing we need to do at this point is to try to as much as possible monitor for any additional breakdown and protect the region while it heals up. I think it is actually doing quite well which is great news. Fortunately there does not appear to be any evidence of active infection 11/14/2018 upon evaluation today patient appears to be doing okay in regard to her ischial. Her leg is much more swollen though she has not had a wrap on any type of dressing for some time when home health came out they did not have the Tubigrip. We have not actually seen her since June 27. The patient when I asked her about it seemed to get somewhat upset to be honest. She seems to be having some trouble at home. She states that she had to "raise hell" just to be able to get here to this appointment today. With that being said I really do think she needs to have Sauseda, Keary H. ([id number]) this wraps and keep the swelling under control and was doing a whole lot better but now seems to be not doing quite as well to be honest. The anterior portion of her shin does  seem to be healing and doing better although it did end up opening into the wound. Again that was where home health the wrap does have a little bit too tight causing this issue previously. 11/27/2020 upon evaluation today patient appears to be doing poorly in regard to her anterior shin where she did have some area where the wrap actually aggravated the spot. With that being said I think that we do need to make sure to pad this extremely well there is already some signs of healing at this point. I did believe this was probably can open even when I saw it last week and to be honest it has opened. The heel also is showing signs of improvement I do see some slough noted but nothing too significant which is great news. No fevers, chills, nausea, vomiting, or diarrhea. 12/07/2020 upon evaluation today patient appears to be doing decently well in regard to her wounds. Both are showing signs of some slough and biofilm buildup on the surface of the wounds. Fortunately there is no signs of active infection at this time which is great news. No fevers, chills, nausea, vomiting, or diarrhea. 12/15/2020 upon evaluation today patient appears to be doing well with regard to her wounds. She is showing signs of improvement at both locations which is great news. Fortunately I do not see any evidence of active infection at this time which is great as well. 12/25/2020 upon evaluation today patient appears to be doing well with regard to her wound on the posterior heel. This seems to  be making some progress which is great news. Unfortunately the biggest issue is that of the shin which again just does not seem to be healing as effectively and quickly as I would like to see. Fortunately there does not appear to be any evidence of active infection which is great news I do believe that we can try to do something a little different using Tubigrip instead of the compression wrapping to see if this will keep pressure off of the shin  allow this to heal more effectively and quickly. 01/02/2021 upon evaluation today patient's wound on the shin actually appears to be making excellent progress which is great news and overall extremely pleased with where things stand today. There is no sign of active infection at this time. No fevers, chills, nausea, vomiting, or diarrhea. Her heel is going require some debridement but overall does not appear to be doing too poorly. 01/09/2021 upon evaluation today patient appears to be doing better in general in regard to her wounds overall. The anterior shin especially seems to be making good progress. I think we are getting close to resolution here. The heel is a little bit more concerning and that it measures a little larger. There is also some erythema surrounding. I think that we probably need to see about doing something to address this I think a culture is probably appropriate as well. 9/26; patient has 2 wound areas 1 on the left anterior shin and the other on the left Achilles heel. Using Hydrofera Blue to both of these. The patient had a culture done last time that showed Pseudomonas from the left Achilles heel. I am not certain whether she received antibiotics 02/01/2021 upon evaluation today patient appears to be doing well with regard to her wound on the shin this is completely healed. In regard to the wound on her heel this is showing signs of improvement which is good news fortunately there is no evidence of active infection which is great and overall I think you are making progress here since the beginning although it has somewhat plateaued a little bit more recently. 02/15/2021 upon evaluation today patient appears to be doing well with regard to her anterior leg ulcer this is showing signs of complete epithelization and looks good. In regard to the heel this is still open and giving her trouble. Again this is a pressure injury and fortunately I do not see any signs of infection but  unfortunately is also not significantly smaller which is not good. Overall I think that her biggest issue currently is good to be to try to do something to get this moving in the right direction I Minna check into TheraSkin is a possibility for her I think this could be of benefit. 03/05/2021 upon evaluation today patient appears to be doing a little bit more poorly in regard to her wound. She has been tolerating the dressing changes without complication. Fortunately there does not appear to be any evidence of active infection systemically at this time which is great news. No fevers, chills, nausea, vomiting, or diarrhea. Locally does appear that she probably has some cellulitis the leg is red and a little bit warm as well as the foot based on what I see today. 03/20/2021 upon evaluation today patient appears to be doing poorly currently in regard to her left heel. This appears to have some pressure injury and in fact there is a lot of evidence of deep tissue injury here which has me somewhat concerned as well. Fortunately  there does not appear to be any signs of infection which is good news but unfortunately there is a lot of necrotic tissue. Electronic Signature(s) Signed: 03/20/2021 4:41:38 PM By: Worthy Keeler PA-C Entered By: Worthy Keeler on 03/20/2021 16:41:37 [name] ([id number]) -------------------------------------------------------------------------------- Physical Exam Details Patient Name: [name] Date of Service: 03/20/2021 12:30 PM Medical Record Number: [id number] Patient Account Number: [id number] Date of Birth/Sex: 09/05/1950 (70 y.o. F) Treating RN: Cornell Barman Primary Care Provider: [name] Other Clinician: Referring Provider: [name] Treating Provider/Extender: [name] in Treatment: 36 Constitutional Well-nourished and well-hydrated in no acute distress. Respiratory normal breathing without difficulty. Psychiatric this  patient is able to make decisions and demonstrates good insight into disease process. Alert and Oriented x 3. pleasant and cooperative. Notes Patient's wound bed actually showed signs of necrotic tissue noted over the heel location and again there does appear to be deep tissue injury noted as well. I think this is coming back down to a pressure problem. Nonetheless we have not heard anything from home health as far as what was going on obviously if this was a major issue I really would have expected to have heard from them. I am not certain what exactly the delay is there the patient states that they really do not come out regularly for changing the dressings unfortunately. She also had a fall where she apparently damaged her Foley bag and they still have not come out and change that it is in a trash bag draining and leaking due to the fact that he got torn and she has been told that the supplies were on "backorder". Electronic Signature(s) Signed: 03/20/2021 4:42:41 PM By: Worthy Keeler PA-C Entered By: Worthy Keeler on 03/20/2021 16:42:40 [name] ([id number]) -------------------------------------------------------------------------------- Physician Orders Details Patient Name: [name] Date of Service: 03/20/2021 12:30 PM Medical Record Number: [id number] Patient Account Number: [id number] Date of Birth/Sex: January 26, 1951 (70 y.o. F) Treating RN: Cornell Barman Primary Care Provider: [name] Other Clinician: Referring Provider: [name] Treating Provider/Extender: [name] in Treatment: 79 Verbal / Phone Orders: No Diagnosis Coding ICD-10 Coding Code Description [phone] Pressure ulcer of left heel, stage 3 I87.2 Venous insufficiency (chronic) (peripheral) L97.822 Non-pressure chronic ulcer of other part of left lower leg with fat layer exposed I10 Essential (primary) hypertension N18.30 Chronic kidney disease, stage 3 unspecified J44.9 Chronic  obstructive pulmonary disease, unspecified G35 Multiple sclerosis Follow-up Appointments o Return Appointment in 1 week. [place]: - [facility] for wound care. May utilize formulary equivalent dressing for wound treatment orders unless otherwise specified. Home Health Nurse may visit PRN to address patientos wound care needs. o Scheduled days for dressing changes to be completed; exception, patient has scheduled wound care visit that day. - Monday, Wednesday, Friday o **Please direct any NON-WOUND related issues/requests for orders to patient's Primary Care Physician. **If current dressing causes regression in wound condition, may D/C ordered dressing product/s and apply Normal Saline Moist Dressing daily until next [place] or Other MD appointment. **Notify [facility] of regression in wound condition at [phone]. Bathing/ Shower/ Hygiene o Clean wound with Normal Saline or wound cleanser. Edema Control - Lymphedema / Segmental Compressive Device / Other Bilateral Lower Extremities o Tubigrip single layer applied. - size D o Elevate, Exercise Daily and Avoid Standing for Long Periods of Time. o Elevate legs to the level of the heart and pump  ankles as often as possible - ELEVATE LEGS o Elevate leg(s) parallel to the floor when sitting. o DO YOUR BEST to sleep in the bed at night. DO NOT sleep in your recliner. Long hours of sitting in a recliner leads to swelling of the legs and/or potential wounds on your backside. Off-Loading Left Lower Extremity o Open toe surgical shoe o Turn and reposition every 2 hours o Other: - Keep pressure off left heel Wound Treatment Wound #12 - Calcaneus Wound Laterality: Left Cleanser: Wound Cleanser (Home Health) (Generic) 2 x Per Week/30 Days Discharge Instructions: Wash your hands with soap and water. Remove old dressing, discard into plastic bag and  place into trash. Cleanse the wound with Wound Cleanser prior to applying a clean dressing using gauze sponges, not tissues or cotton balls. Do not scrub or use excessive force. Pat dry using gauze sponges, not tissue or cotton balls. Primary Dressing: Iodosorb 40 (g) (Home Health) (Generic) 2 x Per Week/30 Days Discharge Instructions: Apply IodoSorb to wound bed only as directed. Secondary Dressing: ABD Pad 5x9 (in/in) (Home Health) (Generic) 2 x Per Week/30 Days Discharge Instructions: Cover with ABD pad MARLIN, BRYS ([id number]) Secured With: Conforming Stretch Gauze Bandage 4x75 (in/in) (Home Health) 2 x Per Week/30 Days Discharge Instructions: Apply as directed Secured With: Tubigrip Size D, 3x10 (in/yd) (Home Health) 2 x Per Week/30 Days Electronic Signature(s) Signed: 03/20/2021 4:45:38 PM By: Gretta Cool, BSN, RN, CWS, Kim RN, BSN Signed: 03/20/2021 5:02:50 PM By: Worthy Keeler PA-C Entered By: Gretta Cool, BSN, RN, CWS, Kim on 03/20/2021 13:53:40 PANG, ROBERS ([id number]) -------------------------------------------------------------------------------- Problem List Details Patient Name: [name] Date of Service: 03/20/2021 12:30 PM Medical Record Number: [id number] Patient Account Number: [id number] Date of Birth/Sex: 07-Mar-1951 (70 y.o. F) Treating RN: Cornell Barman Primary Care Provider: [name] Other Clinician: Referring Provider: [name] Treating Provider/Extender: [name] in Treatment: 34 Active Problems ICD-10 Encounter Code Description Active Date MDM Diagnosis [id number] Pressure ulcer of left heel, stage 3 07/20/2020 No Yes I87.2 Venous insufficiency (chronic) (peripheral) 01/02/2021 No Yes L97.822 Non-pressure chronic ulcer of other part of left lower leg with fat layer 11/27/2020 No Yes exposed I10 Essential (primary) hypertension 07/20/2020 No Yes N18.30 Chronic kidney disease, stage 3 unspecified 07/20/2020 No Yes J44.9 Chronic obstructive  pulmonary disease, unspecified 07/20/2020 No Yes G35 Multiple sclerosis 07/20/2020 No Yes Inactive Problems Resolved Problems Electronic Signature(s) Signed: 03/20/2021 1:06:29 PM By: Worthy Keeler PA-C Entered By: Worthy Keeler on 03/20/2021 13:06:29 Urbani, [name] ([id number]) -------------------------------------------------------------------------------- Progress Note Details Patient Name: [name] Date of Service: 03/20/2021 12:30 PM Medical Record Number: [id number] Patient Account Number: [id number] Date of Birth/Sex: 07-30-1950 (70 y.o. F) Treating RN: Cornell Barman Primary Care Provider: [name] Other Clinician: Referring Provider: [name] Treating Provider/Extender: [name] in Treatment: 34 Subjective Chief Complaint Information obtained from Patient Left Heel Pressure Ulcer History of Present Illness (HPI) 70 year old with a past medical history significant for MS, urinary incontinence, and obesity. She has been seen in the wound clinic before for lower extremity ulcerations treated with compression therapy. she is also known to have hypertension, peripheral vascular disease, COPD, obstructive sleep apnea, lumbar radiculopathy, kyphoscoliosis, urinary issues and tobacco abuse. Smokes a packet of cigarettes a day was recently seen at the [facility] for swelling of her legs and feet with a ulceration on the dorsum of the right foot which has been there for about 6 months. she was recently in the ER  about a month ago where she was seen for shortness of breath and swelling of the legs and a chest x-ray was within normal limits had an increase in her BNP and was given Lasix and put on doxycycline for a mild cellulitis and possible UTI. Wounds aresmaller today 11/13/15. Debrided and will continue Santyl. 12/21/2015 -- she was admitted to the hospital overnight on 12/18/2015 and diagnosed with urinary retention and cellulitis of the  left lower leg. is past to take clindamycin and use Santyl for her wounds. 01/15/2016 -- come back to see [place] for almost a month and continues to be noncompliant with her dressings 01/30/16 patient presents today for a follow-up visit concerning her ongoing bilateral lower extremity wounds. We have not seen her for the past 2 weeks although we are supposed to be seen her for weekly visits. She currently has a Foley catheter. She tells me that the wounds are intensely painful especially with pressure and palpation at this point in time. Fortunately she is having no interval signs or symptoms of systemic infection but unfortunately the wounds have not improved dramatically since we last saw her. She does have home health coming to take care of her as well. She is currently not in any compression wraps. 02/06/16 ON evaluation today patient continues to experience discomfort regarding her bilateral lower extremity ulcers. She has continued to tolerate the dressing changes at this point in time and continues to have a Foley catheter as well. Fortunately she is back this week in the past she has been somewhat noncompliant with follow-ups I'm glad to see her today. She tells me that her pain level varies but can be as high as a 7 out of 10 with manipulation of the wound. She tells me that she used to be on oxycodone which was managed by the pain clinic although she is no longer on that as she tells me that she was actually smoking marijuana at the time and when this was found out they discontinued her pain medication. She no longer is taking anything pain medication wise and she also does not smoke cigarettes nor marijuana at this point. 02/12/16; this is a patient I don't believe I have previously seen. She has multiple sclerosis. She has 4 punched-out areas on the anterior lateral left leg 2 areas on the right these are all in the same condition. Reasonably small [dime size] wounds each was some depth.  Surface of these wounds does not look particularly healthy as there is adherent slough. There is no evidence of surrounding infection or inflammation. ABIs in this clinic were 0.87 on the right and 0.81 on the left. She is listed as having PAD and is a smoker. Not a diabetic 02/20/16 patient I gave antibiotics to last week for erythema around both wound sites on the left lateral leg and right lateral leg. This appears to be a lot better. One of the areas on the left leg is healed however she still has 3 punched-out open areas on the left lateral calf and one on the right lateral calf and one on the dorsal foot. She is an ex-smoker quitting 1 month ago. ABIs in this clinic were 0.87 on the right 0.81 on the left 03/12/16; this is a patient I really don't have a good sense of. It would appear for the first 5 or 6 weeks of her stay in this clinic she was cared for by Dr. Con Memos. She appeared on my schedule in mid October and I  saw her twice. She has not been here however in 3 weeks. She has advanced Wound Care at home where she lives with her husband. She has multiple sclerosis. I saw her the first time she had 4 punched-out small wounds on the anterior lateral left leg it appears that she is now down to 2. She also had wounds on the lateral and medial aspect of her right leg which were also small and punched out. The only one that remains is on the right lateral. Because of the nature of her wound I went ahead and ordered formal arterial studies this showed an ABI of 1 on the right and one on the left. TBI of 1.4 on the right and 0.79 on the left. She had normal waveforms. She was in the emergency room on 02/28/16; there is a noted edema of her left leg after a fall. She had a duplex ultrasound that showed no evidence for an acute DVT from the left groin to the popliteal fossa. The study was limited in the calf veins due to edema. Also noticeable that she had a left inguinal enlarged lymph node up to 6.2  cm. She also had a left knee x-ray that showed no acute findings and a right foot x-ray that showed soft tissue swelling but no radiographic evidence of osteomyelitis. The patient does stop smoking 03-19-16 Ms Wetherell presents today for evaluation of her bilateral lower extremity ulcerations. She states that she has not smoked in several weeks. She denies any pain or discomfort to bilateral lower extremities, tolerating compression therapy as ordered. 03/26/16; I have not seen this patient in 2 weeks however she has done very well with improvements in the areas in question. After we obtain normal arterial studies, increasing the 4-layer compression really seems to look done a nice job here. She has one open area on the left lateral leg and one on the medial right leg. These have filled in nicely and are now superficial wounds that showed epithelialized. I note the left inguinal lymph node at 6.2 cm. I may need to refer this back to the patient's primary doctor. 04/03/16; patient has 2 remaining wounds 1 on the left lateral leg and one on the right medial leg. Both of filled in nicely since we are able to increase her from a 3 layer to a 4 layer compression. I am also following up on the lymph node notable on her left leg DVT rule out in November 04/10/16; area on the right lateral leg is healed. The area on the left leg is still open but looks considerably better with healthy granulation and less wound area. I12/20/17; last week we healed the patient doubt with regards to the wound on the right leg to her on compression stocking 20-30 mmHg as it turns out I don't think she had a stocking, predictably therefore she has reopened. The left leg as well as the wound on the lateral aspect. Been generally small line 04/24/16; we healed out her right leg 2 weeks ago to her own 20-30 mm compression stockings although as it turns out she didn't have these and did not purchase some apparently because of financial  issues. The left leg wound is on the lateral aspect. Both of these wounds are just about healed. 05/01/16; her wounds on the left leg are healed out today. The area on the right leg is also healed. Vitamin diligent effort of our staff we have not been able to get any form of compression stocking  to this patient. The orders for juxtalite's were given to home health this never materialized. We [place], Babette H. ([id number]) ordered compression stockings I don't think she was able to afford these. We had a discussion today with both the patient and her husband without these, will accumulate edema and the wounds will simply reopen again. 05/14/16 READMISSION this is a patient we healed out 2 weeks ago. As bilateral lower extremity venous wounds probably some degree of lymphedema. At the beginning in November I did a duplex ultrasound of her left leg that was negative for DVT but did show a lymph node in the left inguinal area presumably reactive. We've been using compression to her lower extremities eventually healed all her wounds on her bilateral calves but we were not able to get any form of compression stockings for her in spite of intense efforts of our staff. She returns today with reopening on the left leg did not the right she has several superficial areas anteriorly but an actual frank ulcer on the lateral left calf. This is about the size of a dime or smaller. She does not really complain of pain. The patient has a history of PAD however arterial studies we did in November were normal. Her ABIs and Doppler waveforms were both within the normal limits. The patient has a history of MS. In discussing things with her today it would appear that the opening was noted 2 weeks ago according to the patient. We gave her Tubigrip stockings when she left the clinic 05/21/16; she has not yet had the duplex ultrasound of her thigh, this is booked for Thursday. The wound on the left lateral lower leg is  improved 05/28/16; her duplex ultrasound of the left leg specifically her left thigh did not show a DVT however did show hypoechogenic enlarged left inguinal lymph nodes up to 6.9 x 3.7 x 6.8. This also showed in the one in November however maximal diameter was 6.2, at that point I thought these were reactive secondary to cellulitis however there is obviously a differential here. Her wound on the left lower leg is closed. She has a superficial open area on the base of her left fifth metatarsal head on the plantar foot. It looks as though she has some wrap injuries on the anterior leg 06/05/16; since she was last year she had a fall on 1/31. She was seen in the ER but sent back home. I think she is also been seen by her primary physician who picked up on the swelling and the lymphadenopathy. She is ordered a CT scan of the abdomen and pelvis however this will be without contrast because of stage IIIB renal insufficiency. Nevertheless this should be a start the workup to make sure there is not is more systemic problem here. She will probably need consideration of a biopsy of the lymph node in her inguinal area, this would need general surgery. As far as her wounds are concern today the area on the left plantar foot is healed. She has a small weeping area on the left lower leg. The wrap injury from last week has largely Pardoxically there is a lot less edema in the left thigh. 06/12/16; CT scan is on Friday. She has 2 small open areas one on the lateral left lower leg and one on the anterior lower leg on the left. Both of these looks healthy. I reduced her compression last week to Kerlix and Coban this seems to have not a reasonable job 07/02/16; the  patient is now in the skilled facility. I think this was arranged out of the ER when she fell at home although I'm not exactly sure. She is going for I think a laparotomy for the pelvic mass next week. All the wounds in the left leg have  healed Readmission: 07/20/2020 upon evaluation today patient presents for reevaluation here in clinic unfortunately she is having issues with a significant stage III pressure ulcer to her left heel. This is something that occurred when she was in the hospital sick. And subsequently though she does not appear to have any new injury she is still trying to recover from the pressure injury which was quite severe during that time. I do not see any signs of infection right now which is great news. With that being said she has been require some debridement to try to clear this up as much as possible. We need to get the wound VAC cleared so that she can see overall healing and improvement that were looking for. The patient does have a history of hypertension, chronic kidney disease stage III, COPD, and multiple sclerosis. Currently she has been trying to do what she can to keep pressure off of her heel while in bed and in other locations as well. 08/04/20 upon evaluation today patient appears to be doing extremely well currently in regard to her wound on the heel. I do believe the Iodoflex has helped loosen things appear. Unfortunately she tells me that the home health nurse has not been putting the Iodoflex on the wound. That is news to [place] and that they tell me they got supplies in the mail but they did not see the Iodoflex being used. Nonetheless I am wondering if this is just something that they missed or if it truly has not been. I did tell them to look when they get home in their box of supplies to see if they saw the dressing which showed and what it should look like. 08/11/2020 upon evaluation today patient appears to be doing decently well in regard to her wound although unfortunately this is showing signs of some blue-green drainage consistent with Pseudomonas it does have a little worried in this regard. Fortunately there is no evidence of active infection which is great news. No fevers, chills,  nausea, vomiting, or diarrhea. 08/18/2020 upon evaluation today patient appears to be doing well with regard to her wound. She has been tolerating the dressing changes without complication. Fortunately there is no signs of active infection at this time. No fevers, chills, nausea, vomiting, or diarrhea. 4/29; left heel wound pressure ulcer. Slightly smaller. 09/04/2020 upon evaluation today patient appears to be doing well with regard to her wound. This is actually on her heel and seems to be doing well other than the fact that is a little bit moist from excessive drainage. There does not appear to be any signs of active infection at this time. No fevers, chills, nausea, vomiting, or diarrhea.. 09/11/2020 upon evaluation today patient appears to be doing okay in regard to her wound. With that being said she still has quite a bit of drainage noted at this point. Fortunately there is no signs of active infection which is great news overall I am extremely pleased with where things stand today. I do think we need to try to do something a little bit more to help control the drainage a bit better. 5/23; left posterior heel. This actually looks quite good. Using Hydrofera Blue under compression. Dimensions are smaller  09/26/2020 upon evaluation today patient appears to be doing well with regard to her heel ulcer. In fact this is showing signs of excellent improvement and overall I am extremely pleased with where things stand today. I do not see any evidence of infection which is great news and overall I feel like she is headed in the correct direction. 10/09/2020 upon evaluation today patient appears to be doing excellent in regard to her wound. She has been tolerating the dressing changes without complication. Fortunately there is no evidence of active infection which is also excellent news. No fevers, chills, nausea, vomiting, or diarrhea. 10/16/2020 upon evaluation today patient appears to be doing well with  regard to her heel ulcer. Unfortunately she is having issues with an area of deep tissue injury/pressure injury over the shin due to the wrap being on way too tight today. Fortunately there does not appear to be any signs of infection currently. No fevers, chills, nausea, vomiting, or diarrhea. 10/23/2020 upon evaluation today patient appears to be doing better in regard to her wound. Fortunately there does not appear to be any signs of active infection at this time which is great news. In general I am extremely pleased with where things stand at this point. The patient does not show any signs of worsening which is excellent. She unfortunately still has the area in question with regard to the region over the anterior portion of her shin where she did have some deep tissue injury. Some of this is starting to open up a little bit. With that being said I think the biggest thing we need to do at this point is to try to as much as possible monitor for any additional breakdown and protect the region while it heals up. I think it is actually doing quite well which is great news. Fortunately there does not appear to be any evidence of active infection ZIAH, TURVEY. ([id number]) 11/14/2018 upon evaluation today patient appears to be doing okay in regard to her ischial. Her leg is much more swollen though she has not had a wrap on any type of dressing for some time when home health came out they did not have the Tubigrip. We have not actually seen her since June 27. The patient when I asked her about it seemed to get somewhat upset to be honest. She seems to be having some trouble at home. She states that she had to "raise hell" just to be able to get here to this appointment today. With that being said I really do think she needs to have this wraps and keep the swelling under control and was doing a whole lot better but now seems to be not doing quite as well to be honest. The anterior portion of her shin does  seem to be healing and doing better although it did end up opening into the wound. Again that was where home health the wrap does have a little bit too tight causing this issue previously. 11/27/2020 upon evaluation today patient appears to be doing poorly in regard to her anterior shin where she did have some area where the wrap actually aggravated the spot. With that being said I think that we do need to make sure to pad this extremely well there is already some signs of healing at this point. I did believe this was probably can open even when I saw it last week and to be honest it has opened. The heel also is showing signs of improvement I  do see some slough noted but nothing too significant which is great news. No fevers, chills, nausea, vomiting, or diarrhea. 12/07/2020 upon evaluation today patient appears to be doing decently well in regard to her wounds. Both are showing signs of some slough and biofilm buildup on the surface of the wounds. Fortunately there is no signs of active infection at this time which is great news. No fevers, chills, nausea, vomiting, or diarrhea. 12/15/2020 upon evaluation today patient appears to be doing well with regard to her wounds. She is showing signs of improvement at both locations which is great news. Fortunately I do not see any evidence of active infection at this time which is great as well. 12/25/2020 upon evaluation today patient appears to be doing well with regard to her wound on the posterior heel. This seems to be making some progress which is great news. Unfortunately the biggest issue is that of the shin which again just does not seem to be healing as effectively and quickly as I would like to see. Fortunately there does not appear to be any evidence of active infection which is great news I do believe that we can try to do something a little different using Tubigrip instead of the compression wrapping to see if this will keep pressure off of the shin  allow this to heal more effectively and quickly. 01/02/2021 upon evaluation today patient's wound on the shin actually appears to be making excellent progress which is great news and overall extremely pleased with where things stand today. There is no sign of active infection at this time. No fevers, chills, nausea, vomiting, or diarrhea. Her heel is going require some debridement but overall does not appear to be doing too poorly. 01/09/2021 upon evaluation today patient appears to be doing better in general in regard to her wounds overall. The anterior shin especially seems to be making good progress. I think we are getting close to resolution here. The heel is a little bit more concerning and that it measures a little larger. There is also some erythema surrounding. I think that we probably need to see about doing something to address this I think a culture is probably appropriate as well. 9/26; patient has 2 wound areas 1 on the left anterior shin and the other on the left Achilles heel. Using Hydrofera Blue to both of these. The patient had a culture done last time that showed Pseudomonas from the left Achilles heel. I am not certain whether she received antibiotics 02/01/2021 upon evaluation today patient appears to be doing well with regard to her wound on the shin this is completely healed. In regard to the wound on her heel this is showing signs of improvement which is good news fortunately there is no evidence of active infection which is great and overall I think you are making progress here since the beginning although it has somewhat plateaued a little bit more recently. 02/15/2021 upon evaluation today patient appears to be doing well with regard to her anterior leg ulcer this is showing signs of complete epithelization and looks good. In regard to the heel this is still open and giving her trouble. Again this is a pressure injury and fortunately I do not see any signs of infection but  unfortunately is also not significantly smaller which is not good. Overall I think that her biggest issue currently is good to be to try to do something to get this moving in the right direction I [place] check into [employer]  is a possibility for her I think this could be of benefit. 03/05/2021 upon evaluation today patient appears to be doing a little bit more poorly in regard to her wound. She has been tolerating the dressing changes without complication. Fortunately there does not appear to be any evidence of active infection systemically at this time which is great news. No fevers, chills, nausea, vomiting, or diarrhea. Locally does appear that she probably has some cellulitis the leg is red and a little bit warm as well as the foot based on what I see today. 03/20/2021 upon evaluation today patient appears to be doing poorly currently in regard to her left heel. This appears to have some pressure injury and in fact there is a lot of evidence of deep tissue injury here which has me somewhat concerned as well. Fortunately there does not appear to be any signs of infection which is good news but unfortunately there is a lot of necrotic tissue. Objective Constitutional Well-nourished and well-hydrated in no acute distress. Vitals Time Taken: 12:54 PM, Height: 64 in, Weight: 265 lbs, BMI: 45.5, Temperature: 98.2 F, Pulse: 80 bpm, Respiratory Rate: 18 breaths/min, Blood Pressure: 148/61 mmHg. Respiratory normal breathing without difficulty. Psychiatric KAZIAH, KRIZEK ([id number]) this patient is able to make decisions and demonstrates good insight into disease process. Alert and Oriented x 3. pleasant and cooperative. General Notes: Patient's wound bed actually showed signs of necrotic tissue noted over the heel location and again there does appear to be deep tissue injury noted as well. I think this is coming back down to a pressure problem. Nonetheless we have not heard anything from home health  as far as what was going on obviously if this was a major issue I really would have expected to have heard from them. I am not certain what exactly the delay is there the patient states that they really do not come out regularly for changing the dressings unfortunately. She also had a fall where she apparently damaged her Foley bag and they still have not come out and change that it is in a trash bag draining and leaking due to the fact that he got torn and she has been told that the supplies were on "backorder". Integumentary (Hair, Skin) Wound #12 status is Open. Original cause of wound was Pressure Injury. The date acquired was: 06/27/2020. The wound has been in treatment 34 weeks. The wound is located on the Left Calcaneus. The wound measures 3.5cm length x 4.5cm width x 0.3cm depth; 12.37cm^2 area and 3.711cm^3 volume. There is Fat Layer (Subcutaneous Tissue) exposed. There is no tunneling or undermining noted. There is a medium amount of serous drainage noted. The wound margin is thickened. There is no granulation within the wound bed. There is a large (67-100%) amount of necrotic tissue within the wound bed including Eschar and Adherent Slough. Assessment Active Problems ICD-10 Pressure ulcer of left heel, stage 3 Venous insufficiency (chronic) (peripheral) Non-pressure chronic ulcer of other part of left lower leg with fat layer exposed Essential (primary) hypertension Chronic kidney disease, stage 3 unspecified Chronic obstructive pulmonary disease, unspecified Multiple sclerosis Procedures Wound #12 Pre-procedure diagnosis of Wound #12 is a Pressure Ulcer located on the Left Calcaneus . There was a Excisional Skin/Subcutaneous Tissue Debridement with a total area of 15.75 sq cm performed by Tommie Sams., PA-C. With the following instrument(s): Curette to remove Non-Viable tissue/material. Material removed includes Eschar, Subcutaneous Tissue, Slough, and Fibrin/Exudate. No  specimens were taken. A time out was  conducted at 13:33, prior to the start of the procedure. A Minimum amount of bleeding was controlled with Pressure. The procedure was tolerated well. Post Debridement Measurements: 3.5cm length x 4.5cm width x 0.3cm depth; 3.711cm^3 volume. Post debridement Stage noted as Unstageable/Unclassified. Character of Wound/Ulcer Post Debridement is stable. Post procedure Diagnosis Wound #12: Same as Pre-Procedure Plan Follow-up Appointments: Return Appointment in 1 week. Home Health: [facility] for [facility] care. May utilize formulary equivalent dressing for wound treatment orders unless otherwise specified. Home Health Nurse may visit PRN to address patient s wound care needs. Scheduled days for dressing changes to be completed; exception, patient has scheduled wound care visit that day. - Monday, Wednesday, Friday **Please direct any NON-WOUND related issues/requests for orders to patient's Primary Care Physician. **If current dressing causes regression in wound condition, may D/C ordered dressing product/s and apply Normal Saline Moist Dressing daily until next [place] or Other MD appointment. **Notify [facility] of regression in wound condition at [phone]. Bathing/ Shower/ Hygiene: Clean wound with Normal Saline or wound cleanser. Edema Control - Lymphedema / Segmental Compressive Device / Other: Tubigrip single layer applied. - size D Elevate, Exercise Daily and Avoid Standing for Long Periods of Time. Elevate legs to the level of the heart and pump ankles as often as possible - ELEVATE LEGS Elevate leg(s) parallel to the floor when sitting. DO YOUR BEST to sleep in the bed at night. DO NOT sleep in your recliner. Long hours of sitting in a recliner leads to swelling of the legs and/or Haisley, Jada H. ([id number]) potential wounds on your backside. Off-Loading: Open toe surgical  shoe Turn and reposition every 2 hours Other: - Keep pressure off left heel WOUND #12: - Calcaneus Wound Laterality: Left Cleanser: Wound Cleanser (Home Health) (Generic) 2 x Per Week/30 Days Discharge Instructions: Wash your hands with soap and water. Remove old dressing, discard into plastic bag and place into trash. Cleanse the wound with Wound Cleanser prior to applying a clean dressing using gauze sponges, not tissues or cotton balls. Do not scrub or use excessive force. Pat dry using gauze sponges, not tissue or cotton balls. Primary Dressing: Iodosorb 40 (g) (Home Health) (Generic) 2 x Per Week/30 Days Discharge Instructions: Apply IodoSorb to wound bed only as directed. Secondary Dressing: ABD Pad 5x9 (in/in) (Home Health) (Generic) 2 x Per Week/30 Days Discharge Instructions: Cover with ABD pad Secured With: Conforming Stretch Gauze Bandage 4x75 (in/in) (Home Health) 2 x Per Week/30 Days Discharge Instructions: Apply as directed Secured With: Tubigrip Size D, 3x10 (in/yd) (Home Health) 2 x Per Week/30 Days 1. I would recommend currently that we go ahead and initiate treatment with Iodosorb at this time to try to clean up the surface of the wound. 2. I am also can recommend that we use a ABD pad and roll gauze to secure in place over top of this. 3. We will use Tubigrip size D and the patient needs to be trying to elevate her legs much as possible to help with edema control. She also needs to be keeping pressure off to allow this to heal especially in light of the fact this appears to be more than anything else a deep tissue injury. That was discussed in detail with her today. We will see patient back for reevaluation in 1 week here in the clinic. If anything worsens or changes patient will contact our office for additional recommendations. Electronic Signature(s) Signed: 03/20/2021  4:43:53 PM By: Worthy Keeler PA-C Entered By: Worthy Keeler on 03/20/2021 16:43:53 Rou[place]ush, [name] ([id number]) -------------------------------------------------------------------------------- SuperBill Details Patient Name: [name] Date of Service: 03/20/2021 Medical Record Number: [id number] Patient Account Number: [id number] Date of Birth/Sex: 16-May-1950 (70 y.o. F) Treating RN: Cornell Barman Primary Care Provider: [name] Other Clinician: Referring Provider: [name] Treating Provider/Extender: [name] in Treatment: 34 Diagnosis Coding ICD-10 Codes Code Description [phone] Pressure ulcer of left heel, stage 3 I87.2 Venous insufficiency (chronic) (peripheral) L97.822 Non-pressure chronic ulcer of other part of left lower leg with fat layer exposed I10 Essential (primary) hypertension N18.30 Chronic kidney disease, stage 3 unspecified J44.9 Chronic obstructive pulmonary disease, unspecified G35 Multiple sclerosis Facility Procedures CPT4 Code: [id number] Description: 65537 - DEB SUBQ TISSUE 20 SQ CM/< Modifier: Quantity: 1 CPT4 Code: Description: ICD-10 Diagnosis Description L89.623 Pressure ulcer of left heel, stage 3 Modifier: Quantity: Physician Procedures CPT4 Code: 4827078 Description: 67544 - WC PHYS LEVEL 4 - EST PT Modifier: 25 Quantity: 1 CPT4 Code: Description: ICD-10 Diagnosis Description L89.623 Pressure ulcer of left heel, stage 3 I87.2 Venous insufficiency (chronic) (peripheral) L97.822 Non-pressure chronic ulcer of other part of left lower leg with fat layer I10 Essential (primary)  hypertension Modifier: exposed Quantity: CPT4 Code: 9201007 Description: 11042 - WC PHYS SUBQ TISS 20 SQ CM Modifier: Quantity: 1 CPT4 Code: Description: ICD-10 Diagnosis Description L89.623 Pressure ulcer of left heel, stage 3 Modifier: Quantity: Electronic Signature(s) Signed: 03/20/2021 4:44:41 PM By: Worthy Keeler PA-C Entered By: Worthy Keeler on 03/20/2021 16:44:41

## 2021-03-23 ENCOUNTER — Emergency Department: Payer: Medicare Other

## 2021-03-23 ENCOUNTER — Other Ambulatory Visit: Payer: Self-pay

## 2021-03-23 ENCOUNTER — Inpatient Hospital Stay
Admission: EM | Admit: 2021-03-23 | Discharge: 2021-03-29 | DRG: 603 | Disposition: A | Discharge status: Skilled Nursing Facility | Payer: Medicare Other | Attending: Internal Medicine | Admitting: Internal Medicine

## 2021-03-23 DIAGNOSIS — Z7902 Long term (current) use of antithrombotics/antiplatelets: Secondary | ICD-10-CM

## 2021-03-23 DIAGNOSIS — Z825 Family history of asthma and other chronic lower respiratory diseases: Secondary | ICD-10-CM

## 2021-03-23 DIAGNOSIS — E875 Hyperkalemia: Secondary | ICD-10-CM | POA: Diagnosis present

## 2021-03-23 DIAGNOSIS — Y929 Unspecified place or not applicable: Secondary | ICD-10-CM | POA: Diagnosis not present

## 2021-03-23 DIAGNOSIS — B965 Pseudomonas (aeruginosa) (mallei) (pseudomallei) as the cause of diseases classified elsewhere: Secondary | ICD-10-CM | POA: Diagnosis present

## 2021-03-23 DIAGNOSIS — Y829 Unspecified medical devices associated with adverse incidents: Secondary | ICD-10-CM | POA: Diagnosis present

## 2021-03-23 DIAGNOSIS — E871 Hypo-osmolality and hyponatremia: Secondary | ICD-10-CM | POA: Diagnosis present

## 2021-03-23 DIAGNOSIS — I739 Peripheral vascular disease, unspecified: Secondary | ICD-10-CM | POA: Diagnosis present

## 2021-03-23 DIAGNOSIS — Z8249 Family history of ischemic heart disease and other diseases of the circulatory system: Secondary | ICD-10-CM

## 2021-03-23 DIAGNOSIS — N179 Acute kidney failure, unspecified: Secondary | ICD-10-CM | POA: Diagnosis present

## 2021-03-23 DIAGNOSIS — N183 Chronic kidney disease, stage 3 unspecified: Secondary | ICD-10-CM | POA: Diagnosis present

## 2021-03-23 DIAGNOSIS — Z87891 Personal history of nicotine dependence: Secondary | ICD-10-CM

## 2021-03-23 DIAGNOSIS — Z823 Family history of stroke: Secondary | ICD-10-CM | POA: Diagnosis not present

## 2021-03-23 DIAGNOSIS — I700 Atherosclerosis of aorta: Secondary | ICD-10-CM | POA: Diagnosis present

## 2021-03-23 DIAGNOSIS — F419 Anxiety disorder, unspecified: Secondary | ICD-10-CM | POA: Diagnosis present

## 2021-03-23 DIAGNOSIS — J449 Chronic obstructive pulmonary disease, unspecified: Secondary | ICD-10-CM | POA: Diagnosis present

## 2021-03-23 DIAGNOSIS — B964 Proteus (mirabilis) (morganii) as the cause of diseases classified elsewhere: Secondary | ICD-10-CM | POA: Diagnosis present

## 2021-03-23 DIAGNOSIS — E785 Hyperlipidemia, unspecified: Secondary | ICD-10-CM | POA: Diagnosis present

## 2021-03-23 DIAGNOSIS — N319 Neuromuscular dysfunction of bladder, unspecified: Secondary | ICD-10-CM | POA: Diagnosis present

## 2021-03-23 DIAGNOSIS — I129 Hypertensive chronic kidney disease with stage 1 through stage 4 chronic kidney disease, or unspecified chronic kidney disease: Secondary | ICD-10-CM | POA: Diagnosis present

## 2021-03-23 DIAGNOSIS — N1831 Chronic kidney disease, stage 3a: Secondary | ICD-10-CM | POA: Diagnosis present

## 2021-03-23 DIAGNOSIS — Z79899 Other long term (current) drug therapy: Secondary | ICD-10-CM

## 2021-03-23 DIAGNOSIS — Z8261 Family history of arthritis: Secondary | ICD-10-CM

## 2021-03-23 DIAGNOSIS — Z841 Family history of disorders of kidney and ureter: Secondary | ICD-10-CM

## 2021-03-23 DIAGNOSIS — G35 Multiple sclerosis: Secondary | ICD-10-CM | POA: Diagnosis present

## 2021-03-23 DIAGNOSIS — L03116 Cellulitis of left lower limb: Secondary | ICD-10-CM | POA: Diagnosis present

## 2021-03-23 DIAGNOSIS — L97509 Non-pressure chronic ulcer of other part of unspecified foot with unspecified severity: Secondary | ICD-10-CM

## 2021-03-23 DIAGNOSIS — Z7951 Long term (current) use of inhaled steroids: Secondary | ICD-10-CM

## 2021-03-23 DIAGNOSIS — Z6841 Body Mass Index (BMI) 40.0 and over, adult: Secondary | ICD-10-CM | POA: Diagnosis not present

## 2021-03-23 DIAGNOSIS — D638 Anemia in other chronic diseases classified elsewhere: Secondary | ICD-10-CM | POA: Diagnosis present

## 2021-03-23 DIAGNOSIS — T83518A Infection and inflammatory reaction due to other urinary catheter, initial encounter: Secondary | ICD-10-CM | POA: Diagnosis present

## 2021-03-23 DIAGNOSIS — F32A Depression, unspecified: Secondary | ICD-10-CM | POA: Diagnosis present

## 2021-03-23 DIAGNOSIS — B952 Enterococcus as the cause of diseases classified elsewhere: Secondary | ICD-10-CM | POA: Diagnosis present

## 2021-03-23 DIAGNOSIS — F909 Attention-deficit hyperactivity disorder, unspecified type: Secondary | ICD-10-CM | POA: Diagnosis present

## 2021-03-23 DIAGNOSIS — G4733 Obstructive sleep apnea (adult) (pediatric): Secondary | ICD-10-CM | POA: Diagnosis present

## 2021-03-23 DIAGNOSIS — Z833 Family history of diabetes mellitus: Secondary | ICD-10-CM

## 2021-03-23 DIAGNOSIS — Z8572 Personal history of non-Hodgkin lymphomas: Secondary | ICD-10-CM

## 2021-03-23 DIAGNOSIS — Z20822 Contact with and (suspected) exposure to covid-19: Secondary | ICD-10-CM | POA: Diagnosis present

## 2021-03-23 DIAGNOSIS — N390 Urinary tract infection, site not specified: Secondary | ICD-10-CM

## 2021-03-23 DIAGNOSIS — L89620 Pressure ulcer of left heel, unstageable: Secondary | ICD-10-CM | POA: Diagnosis present

## 2021-03-23 DIAGNOSIS — L0390 Cellulitis, unspecified: Secondary | ICD-10-CM

## 2021-03-23 DIAGNOSIS — R531 Weakness: Secondary | ICD-10-CM

## 2021-03-23 LAB — URINALYSIS, COMPLETE (UACMP) WITH MICROSCOPIC
Glucose, UA: NEGATIVE mg/dL
Hgb urine dipstick: NEGATIVE
Nitrite: NEGATIVE
Protein, ur: 30 mg/dL — AB
Specific Gravity, Urine: 1.015 (ref 1.005–1.030)
WBC, UA: >50 WBC/hpf (ref 0–5)
pH: 5.5 (ref 5.0–8.0)

## 2021-03-23 LAB — COMPREHENSIVE METABOLIC PANEL
ALT: 16 U/L (ref 0–44)
AST: 15 U/L (ref 15–41)
Albumin: 3.6 g/dL (ref 3.5–5.0)
Alkaline Phosphatase: 54 U/L (ref 38–126)
Anion gap: 6 (ref 5–15)
BUN: 45 mg/dL — ABNORMAL HIGH (ref 8–23)
CO2: 22 mmol/L (ref 22–32)
Calcium: 8.3 mg/dL — ABNORMAL LOW (ref 8.9–10.3)
Chloride: 103 mmol/L (ref 98–111)
Creatinine, Ser: 2.22 mg/dL — ABNORMAL HIGH (ref 0.44–1.00)
GFR, Estimated: 23 mL/min — ABNORMAL LOW (ref >60)
Glucose, Bld: 102 mg/dL — ABNORMAL HIGH (ref 70–99)
Potassium: 5.2 mmol/L — ABNORMAL HIGH (ref 3.5–5.1)
Sodium: 131 mmol/L — ABNORMAL LOW (ref 135–145)
Total Bilirubin: 0.7 mg/dL (ref 0.3–1.2)
Total Protein: 6.4 g/dL — ABNORMAL LOW (ref 6.5–8.1)

## 2021-03-23 LAB — CBC WITH DIFFERENTIAL/PLATELET
Abs Immature Granulocytes: 0.06 10*3/uL (ref 0.00–0.07)
Basophils Absolute: 0.1 10*3/uL (ref 0.0–0.1)
Basophils Relative: 1 %
Eosinophils Absolute: 0.1 10*3/uL (ref 0.0–0.5)
Eosinophils Relative: 1 %
HCT: 28.3 % — ABNORMAL LOW (ref 36.0–46.0)
Hemoglobin: 8.9 g/dL — ABNORMAL LOW (ref 12.0–15.0)
Immature Granulocytes: 1 %
Lymphocytes Relative: 7 %
Lymphs Abs: 0.8 10*3/uL (ref 0.7–4.0)
MCH: 27.6 pg (ref 26.0–34.0)
MCHC: 31.4 g/dL (ref 30.0–36.0)
MCV: 87.9 fL (ref 80.0–100.0)
Monocytes Absolute: 0.8 10*3/uL (ref 0.1–1.0)
Monocytes Relative: 8 %
Neutro Abs: 8.8 10*3/uL — ABNORMAL HIGH (ref 1.7–7.7)
Neutrophils Relative %: 82 %
Platelets: 179 10*3/uL (ref 150–400)
RBC: 3.22 MIL/uL — ABNORMAL LOW (ref 3.87–5.11)
RDW: 14.6 % (ref 11.5–15.5)
WBC: 10.6 10*3/uL — ABNORMAL HIGH (ref 4.0–10.5)
nRBC: 0 % (ref 0.0–0.2)

## 2021-03-23 LAB — TROPONIN I (HIGH SENSITIVITY)
Troponin I (High Sensitivity): 11 ng/L (ref <18)
Troponin I (High Sensitivity): 9 ng/L (ref <18)

## 2021-03-23 LAB — CBG MONITORING, ED: Glucose-Capillary: 98 mg/dL (ref 70–99)

## 2021-03-23 LAB — TSH: TSH: 3.559 u[IU]/mL (ref 0.350–4.500)

## 2021-03-23 LAB — MAGNESIUM: Magnesium: 2.5 mg/dL — ABNORMAL HIGH (ref 1.7–2.4)

## 2021-03-23 LAB — RESP PANEL BY RT-PCR (FLU A&B, COVID) ARPGX2
Influenza A by PCR: NEGATIVE
Influenza B by PCR: NEGATIVE
SARS Coronavirus 2 by RT PCR: NEGATIVE

## 2021-03-23 MED ORDER — SODIUM CHLORIDE 0.9 % IV BOLUS
1000.0000 mL | Freq: Once | INTRAVENOUS | Status: AC
  Administered 2021-03-23: 1000 mL via INTRAVENOUS

## 2021-03-23 MED ORDER — MIRABEGRON ER 50 MG PO TB24
50.0000 mg | ORAL_TABLET | Freq: Every day | ORAL | Status: DC
  Administered 2021-03-24 – 2021-03-29 (×6): 50 mg via ORAL
  Filled 2021-03-23 (×6): qty 1

## 2021-03-23 MED ORDER — DULOXETINE HCL 30 MG PO CPEP
60.0000 mg | ORAL_CAPSULE | ORAL | Status: DC
  Administered 2021-03-24 – 2021-03-29 (×6): 60 mg via ORAL
  Filled 2021-03-23 (×6): qty 2

## 2021-03-23 MED ORDER — HEPARIN SODIUM (PORCINE) 5000 UNIT/ML IJ SOLN
5000.0000 [IU] | Freq: Three times a day (TID) | INTRAMUSCULAR | Status: DC
  Administered 2021-03-23 – 2021-03-29 (×17): 5000 [IU] via SUBCUTANEOUS
  Filled 2021-03-23 (×16): qty 1

## 2021-03-23 MED ORDER — ONDANSETRON HCL 4 MG/2ML IJ SOLN
4.0000 mg | Freq: Four times a day (QID) | INTRAMUSCULAR | Status: DC | PRN
  Administered 2021-03-26 – 2021-03-28 (×3): 4 mg via INTRAVENOUS
  Filled 2021-03-23 (×3): qty 2

## 2021-03-23 MED ORDER — HYDROMORPHONE HCL 1 MG/ML IJ SOLN
0.5000 mg | INTRAMUSCULAR | Status: DC | PRN
  Administered 2021-03-23 – 2021-03-27 (×7): 0.5 mg via INTRAVENOUS
  Filled 2021-03-23 (×3): qty 0.5
  Filled 2021-03-23: qty 1
  Filled 2021-03-23 (×4): qty 0.5

## 2021-03-23 MED ORDER — SODIUM CHLORIDE 0.9 % IV SOLN
2.0000 g | Freq: Every day | INTRAVENOUS | Status: DC
  Administered 2021-03-24 – 2021-03-29 (×6): 2 g via INTRAVENOUS
  Filled 2021-03-23 (×2): qty 20
  Filled 2021-03-23 (×2): qty 2
  Filled 2021-03-23: qty 20
  Filled 2021-03-23: qty 2

## 2021-03-23 MED ORDER — ACETAMINOPHEN 325 MG PO TABS: 650.0000 mg | ORAL_TABLET | Freq: Four times a day (QID) | ORAL | Status: DC | PRN

## 2021-03-23 MED ORDER — OXYCODONE HCL 5 MG PO TABS
5.0000 mg | ORAL_TABLET | Freq: Three times a day (TID) | ORAL | Status: DC | PRN
  Administered 2021-03-23 – 2021-03-29 (×10): 5 mg via ORAL
  Filled 2021-03-23 (×9): qty 1

## 2021-03-23 MED ORDER — SODIUM CHLORIDE 0.9 % IV SOLN
1.0000 g | Freq: Once | INTRAVENOUS | Status: AC
  Administered 2021-03-23: 1 g via INTRAVENOUS
  Filled 2021-03-23: qty 10

## 2021-03-23 MED ORDER — POLYETHYLENE GLYCOL 3350 17 G PO PACK: 17.0000 g | PACK | Freq: Every day | ORAL | Status: DC | PRN

## 2021-03-23 MED ORDER — BUPROPION HCL ER (XL) 150 MG PO TB24
300.0000 mg | ORAL_TABLET | Freq: Every day | ORAL | Status: DC
  Administered 2021-03-23 – 2021-03-29 (×7): 300 mg via ORAL
  Filled 2021-03-23 (×8): qty 2

## 2021-03-23 MED ORDER — VORTIOXETINE HBR 5 MG PO TABS
5.0000 mg | ORAL_TABLET | Freq: Every day | ORAL | Status: DC
  Administered 2021-03-23 – 2021-03-29 (×7): 5 mg via ORAL
  Filled 2021-03-23 (×7): qty 1

## 2021-03-23 MED ORDER — QUETIAPINE FUMARATE ER 50 MG PO TB24
150.0000 mg | ORAL_TABLET | Freq: Every day | ORAL | Status: DC
  Administered 2021-03-23 – 2021-03-28 (×6): 150 mg via ORAL
  Filled 2021-03-23 (×7): qty 3

## 2021-03-23 MED ORDER — TRAZODONE HCL 50 MG PO TABS
50.0000 mg | ORAL_TABLET | Freq: Every day | ORAL | Status: DC
  Administered 2021-03-23 – 2021-03-28 (×6): 50 mg via ORAL
  Filled 2021-03-23 (×6): qty 1

## 2021-03-23 MED ORDER — ACETAMINOPHEN 650 MG RE SUPP
650.0000 mg | Freq: Four times a day (QID) | RECTAL | Status: DC | PRN
  Filled 2021-03-23: qty 1

## 2021-03-23 MED ORDER — SODIUM CHLORIDE 0.9 % IV SOLN: Freq: Once | INTRAVENOUS | Status: AC

## 2021-03-23 MED ORDER — ONDANSETRON HCL 4 MG PO TABS: 4.0000 mg | ORAL_TABLET | Freq: Four times a day (QID) | ORAL | Status: DC | PRN

## 2021-03-23 NOTE — Evaluation (Signed)
Physical Therapy Evaluation Patient Details Name: [name] MRN: [id number] DOB: 1950-11-21 Today's Date: 03/23/2021  History of Present Illness   [name] is a 70 y.o. female with medical history significant for neurogenic bladder with indwelling Foley catheter in place, CKD stage IIIa, ADHD, anxiety, COPD, B-cell follicular lymphoma, hypertension, hypotension, kyphoscoliosis, multiple sclerosis, left femur fracture requiring ORIF in October 2021, morbid obesity, PVD, and left heel decubitus ulcer that is managed at the wound care clinic.    Clinical Impression  Pt received in Semi-Fowler's position and agreeable to therapy.  [name] in room during treatment session.  Pt is unable to perform LE strengthening exercises without great difficulty and pt noted pain in the L heel.  Pt notes that she was able to ambulate prior to hospital admission, however notes she has been significantly weaker in the days leading up to hospital admission.  Pt did attempted to stand, however needed mod-maxA to come upright into standing and modA to stand from elevated ED bed.  Pt unable to march and noted dizziness and leg weakness in standing so ambulating was deferred.  Pt assisted back to bed and given VC's for positioning in the bed properly.  Pt unable to perform on her own and needed assistance.  Pt left with all needs met and call bell within reach.  D/C options presented and recommendation discussed with pt stating she did not want to go to a SNF again.  Pt would benefit from short stint of rehab in order to return to PLOF and increase overall QoL.       Recommendations for follow up therapy are one component of a multi-disciplinary discharge planning process, led by the attending physician.  Recommendations may be updated based on patient status, additional functional criteria and insurance authorization.  Follow Up Recommendations Skilled nursing-short term rehab (<3 hours/day)    Assistance  Recommended at Discharge Frequent or constant Supervision/Assistance  Functional Status Assessment Patient has had a recent decline in their functional status and demonstrates the ability to make significant improvements in function in a reasonable and predictable amount of time.  Equipment Recommendations  None recommended by PT    Recommendations for Other Services       Precautions / Restrictions        Mobility  Bed Mobility Overal bed mobility: Needs Assistance Bed Mobility: Supine to Sit     Supine to sit: Mod assist;Max assist     General bed mobility comments: Pt requires HHA and mod-maxA to come into seated position with posterior lean.    Transfers Overall transfer level: Needs assistance Equipment used: Rolling walker (2 wheels) Transfers: Sit to/from Stand Sit to Stand: Mod assist           General transfer comment: modA utilized to come into standing from elevated ED bed.    Ambulation/Gait         Gait velocity: decreased     General Gait Details: deferred due to pt noting dizziness and feeling weak in the LE's.  Stairs            Wheelchair Mobility    Modified Rankin (Stroke Patients Only)       Balance Overall balance assessment: Needs assistance   Sitting balance-Leahy Scale: Poor Sitting balance - Comments: pt has consisten posterior lean and requires mod-maxA to remain upright in ED bed. Postural control: Posterior lean Standing balance support: Bilateral upper extremity supported Standing balance-Leahy Scale: Poor Standing balance comment: Pt with poor  balance in standing, noting dizziness and weakness in the knees.                             Pertinent Vitals/Pain      Home Living                          Prior Function                       Hand Dominance        Extremity/Trunk Assessment                Communication      Cognition                                                 General Comments      Exercises Other Exercises Other Exercises: Pt and spouse educated on role of PT and services provided during hospital stay.  Pt and husband also informed of d/c options and were discussed at end of session.   Assessment/Plan    PT Assessment Patient needs continued PT services  PT Problem List Decreased strength;Decreased activity tolerance;Decreased balance;Decreased mobility;Decreased safety awareness       PT Treatment Interventions      PT Goals (Current goals can be found in the Care Plan section)  Acute Rehab PT Goals Patient Stated Goal: to go home. PT Goal Formulation: With patient/family Time For Goal Achievement: 04/06/21 Potential to Achieve Goals: Poor    Frequency Min 2X/week   Barriers to discharge Decreased caregiver support Pt notes her husband is not able to take care of her like he used to be able to.    Co-evaluation               AM-PAC PT "6 Clicks" Mobility  Outcome Measure Help needed turning from your back to your side while in a flat bed without using bedrails?: A Lot Help needed moving from lying on your back to sitting on the side of a flat bed without using bedrails?: A Lot Help needed moving to and from a bed to a chair (including a wheelchair)?: A Lot Help needed standing up from a chair using your arms (e.g., wheelchair or bedside chair)?: A Lot Help needed to walk in hospital room?: Total Help needed climbing 3-5 steps with a railing? : Total 6 Click Score: 10    End of Session Equipment Utilized During Treatment: Gait belt Activity Tolerance: Patient limited by fatigue Patient left: in bed;with call bell/phone within reach;with family/visitor present Nurse Communication: Mobility status PT Visit Diagnosis: Unsteadiness on feet (R26.81);Other abnormalities of gait and mobility (R26.89);Muscle weakness (generalized) (M62.81);Difficulty in walking, not elsewhere classified  (R26.2)    Time: 8502-7741 PT Time Calculation (min) (ACUTE ONLY): 25 min   Charges:   PT Evaluation $PT Eval Low Complexity: 1 Low PT Treatments $Therapeutic Activity: 8-22 mins        Gwenlyn Saran, PT, DPT 03/23/21, 10:33 PM   Christie Nottingham 03/23/2021, 10:27 PM

## 2021-03-23 NOTE — ED Provider Notes (Signed)
Emergency Medicine Provider Triage Evaluation Note  [name] , a 70 y.o. female  was evaluated in triage.  Pt complains of generalized weakness.  Review of Systems  Positive: Weak all over Negative: Chest pain, sob  Physical Exam  BP (!) 104/44   Pulse 60   Temp 99.1 F (37.3 C) (Oral)   Resp 16   Ht 5\' 5"  (1.651 m)   Wt 122 kg   SpO2 95%   BMI 44.76 kg/m  Gen:   Awake, no distress   Resp:  Normal effort  MSK:   Moves extremities without difficulty dressings on BLE Other:  Indwelling Foley catheter  Medical Decision Making  Medically screening exam initiated at 5:08 AM.  Appropriate orders placed.  [name] was informed that the remainder of the evaluation will be completed by another provider, this initial triage assessment does not replace that evaluation, and the importance of remaining in the ED until their evaluation is complete.  70y/o F here w/ generalized weakness. Labs obtained while patient awaits treatment room.   Paulette Blanch, MD 03/23/21 [phone]

## 2021-03-23 NOTE — ED Provider Notes (Signed)
[facility] Emergency Department Provider Note  Time seen: 8:39 AM  I have reviewed the triage vital signs and the nursing notes.   HISTORY  Chief Complaint Weakness (EMS reports they were called out due to generalized weakness today. )   HPI [name] is a 70 y.o. female with a past medical history of COPD, hypertension, presents to the emergency department for generalized weakness.  According to the patient over the past several days she has been feeling very weak, states a history of COPD and has been feeling somewhat short of breath as well.  No chest pain.  No known fever vomiting or diarrhea.  Patient does state mild cough.  Patient also states a left heel ulcer that has been worsening and now has redness on the left lower extremity.   Past Medical History:  Diagnosis Date   Abdominal aortic atherosclerosis ([place]) 11/11/2016   ADHD    Anxiety    COPD (chronic obstructive pulmonary disease) (HCC)    Depression    major depressive   Dyspnea    doe   Edema    left leg   Follicular lymphoma (HCC)    B Cell   Follicular lymphoma grade II (HCC)    Hypertension    Hypotension    idiopathic   Kyphoscoliosis and scoliosis 11/26/2011   Morbid obesity ([place]) 01/05/2016   Multiple sclerosis (HCC)    Multiple sclerosis ([place])    1980's   Neuromuscular disorder ([place])    Obstructive and reflux uropathy    foley   Pain    atypical facial   Peripheral vascular disease of lower extremity with ulceration ([place]) 11/08/2015   Skin ulcer ([place]) 11/08/2015   Weakness    generalized. has MS    Patient Active Problem List   Diagnosis Date Noted   Closed fracture of distal end of left femur ([place]) 02/04/2020   Femur fracture, left ([place]) 02/04/2020   Weakness of left leg 01/26/2020   Left leg weakness 01/26/2020   COPD (chronic obstructive pulmonary disease) ([place]) 01/01/2020   Chronic indwelling Foley catheter 11/19/2019   Neuropathy    Hypokalemia     Hyperlipidemia    Sepsis secondary to UTI ([place]) 10/17/2019   Weakness    AKI (acute kidney injury) ([place]) 04/20/2019   Pressure injury of skin 02/03/2019   Bilateral lower leg cellulitis 02/02/2019   Ovarian mass, left 01/27/2019   Sepsis ([place]) 01/05/2019   UTI (urinary tract infection) 06/13/2018   Altered mental status 06/11/2018   Fall 05/13/2018   Depression 05/13/2018   Recurrent cellulitis of lower extremity 06/25/2017   Medication monitoring encounter 06/05/2017   Cellulitis of left lower leg 04/25/2017   Adjustment disorder with mixed disturbance of emotions and conduct 04/23/2017   Foot pain, bilateral 02/03/2017   Tinea pedis 02/03/2017   Left ovarian cyst 12/09/2016   Abdominal aortic atherosclerosis ([place]) 11/11/2016   Swelling of lower extremity 10/11/2016   Obstructive sleep apnea 11/94/1740   Follicular lymphoma of intra-abdominal lymph nodes ([place]) 08/06/2016   Multiple falls 06/07/2016   Inguinal adenopathy 05/29/2016   Obesity, morbid ([place]) 01/05/2016   Low HDL (under 40) 12/19/2015   Cellulitis 12/18/2015   Skin ulcer ([place]) 11/08/2015   Peripheral vascular disease of lower extremity with ulceration ([place]) 11/08/2015   CKD (chronic kidney disease) stage 3, GFR 30-59 ml/min (HCC) 11/08/2015   Constipation due to pain medication 01/31/2015   Obstructive sleep apnea of adult 01/13/2015   Pelvic muscle  wasting 01/13/2015   Incomplete bladder emptying 01/13/2015   Headache, migraine 10/24/2014   Atonic neurogenic bladder 10/24/2014   Current tobacco use 10/24/2014   Lumbar radiculopathy, chronic 10/02/2013   COPD with bronchial hyperresponsiveness ([place]) 10/02/2013   Major depressive disorder, recurrent, in partial remission ([place]) 10/02/2013   Essential hypertension 10/02/2013   Multiple sclerosis ([place]) 10/02/2013   Absence of bladder continence 09/25/2012   Acontractile bladder 02/12/2012   Narrowing of intervertebral disc space 11/26/2011   Kyphoscoliosis and  scoliosis 11/26/2011    Past Surgical History:  Procedure Laterality Date   BACK SURGERY N/A 2002   CYST EXCISION     lower back   INGUINAL LYMPH NODE BIOPSY Left 07/04/2016   Procedure: INGUINAL LYMPH NODE BIOPSY;  Surgeon: Christene Lye, MD;  Location: [facility] ORS;  Service: General;  Laterality: Left;   ORIF FEMUR FRACTURE Left 02/04/2020   Procedure: OPEN REDUCTION INTERNAL FIXATION (ORIF) DISTAL FEMUR FRACTURE;  Surgeon: Shona Needles, MD;  Location: [place];  Service: Orthopedics;  Laterality: Left;   PORTACATH PLACEMENT N/A 07/22/2016   Procedure: INSERTION PORT-A-CATH;  Surgeon: Christene Lye, MD;  Location: [facility] ORS;  Service: General;  Laterality: N/A;   TONSILLECTOMY AND ADENOIDECTOMY     TUBAL LIGATION      Prior to Admission medications   Medication Sig Start Date End Date Taking? Authorizing Provider  acetaminophen (TYLENOL) 500 MG tablet Take 1,000 mg by mouth every 8 (eight) hours as needed for moderate pain.    [provider]  amLODipine (NORVASC) 5 MG tablet Take 5 mg by mouth daily. 09/17/19   [provider]  amoxicillin-clavulanate (AUGMENTIN) 875-125 MG tablet Take 1 tablet by mouth 2 (two) times daily.    [provider]  atorvastatin (LIPITOR) 10 MG tablet Take 10 mg by mouth at bedtime.  01/14/19   [provider]  baclofen (LIORESAL) 10 MG tablet Take 1 tablet (10 mg total) by mouth 2 (two) times daily. 02/10/20   Bonnielee Haff, MD  budesonide-formoterol [facility]) 160-4.5 MCG/ACT inhaler Inhale 2 puffs into the lungs 2 (two) times daily.     [provider]  clonazePAM (KLONOPIN) 0.5 MG tablet Take 1 tablet (0.5 mg total) by mouth 2 (two) times daily. 02/10/20   Bonnielee Haff, MD  cyanocobalamin 1000 MCG tablet Take 1,000 mcg by mouth daily.    [provider]  docusate sodium (COLACE) 100 MG capsule Take 1 capsule (100 mg total) by mouth 2 (two) times daily as needed. 11/24/19   Enzo Bi, MD   DULoxetine (CYMBALTA) 60 MG capsule Take 1 capsule (60 mg total) by mouth every morning. Patient taking differently: Take 30 mg by mouth daily. 10/14/16   Vaughan Basta, MD  enoxaparin (LOVENOX) 40 MG/0.4ML injection Inject 0.4 mLs (40 mg total) into the skin daily. 02/07/20 03/08/20  Haddix, Thomasene Lot, MD  famotidine (PEPCID) 10 MG tablet Take 10 mg by mouth daily.    [provider]  gabapentin (NEURONTIN) 600 MG tablet Take 600 mg by mouth 3 (three) times daily.    [provider]  hydrALAZINE (APRESOLINE) 50 MG tablet Take 50 mg by mouth 3 (three) times daily.    [provider]  hydrOXYzine (ATARAX/VISTARIL) 25 MG tablet Take 25 mg by mouth 3 (three) times daily as needed.    [provider]  lisinopril (ZESTRIL) 20 MG tablet Take 0.5 tablets (10 mg total) by mouth daily for 10 days. 02/01/20 02/11/20  Deatra James, MD  magnesium oxide (MAG-OX) 400 MG tablet Take 400 mg by mouth daily.    [provider]  Melatonin 5 MG CAPS Take by mouth.    [provider]  Multiple Vitamin (MULTIVITAMIN WITH MINERALS) TABS tablet Take 1 tablet by mouth daily.    [provider]  MYRBETRIQ 50 MG TB24 tablet TAKE ONE TABLET BY MOUTH ONCE DAILY 04/19/20   Stoioff, Ronda Fairly, MD  oxyCODONE (OXY IR/ROXICODONE) 5 MG immediate release tablet Take 1 tablet (5 mg total) by mouth every 4 (four) hours as needed for moderate pain (pain rated 4-6). 02/07/20   Haddix, Thomasene Lot, MD  polyethylene glycol powder (GLYCOLAX/MIRALAX) 17 GM/SCOOP powder Take 17 g by mouth daily as needed for mild constipation. 05/01/19   [provider]  QUEtiapine Fumarate (SEROQUEL XR) 150 MG 24 hr tablet Take 150 mg by mouth at bedtime.     [provider]  vortioxetine HBr (TRINTELLIX) 5 MG TABS tablet Take 5 mg by mouth daily.    [provider]    No Known Allergies  Family History  Problem Relation Age of Onset   COPD Mother    Diabetes  Mother    Heart failure Mother    Alcohol abuse Father    Kidney disease Father    Kidney failure Father    Arthritis Sister    CAD Maternal Grandmother    Stroke Maternal Grandfather    Arthritis Sister    Mental illness Sister    Arthritis Brother     Social History Social History   Tobacco Use   Smoking status: Former    Packs/day: 1.00    Years: 20.00    Pack years: 20.00    Types: Cigarettes    Start date: 04/30/1995    Quit date: 02/03/2016    Years since quitting: 5.1   Smokeless tobacco: Never  Vaping Use   Vaping Use: Some days  Substance Use Topics   Alcohol use: No    Alcohol/week: 0.0 standard drinks   Drug use: Yes    Types: Marijuana    Comment: smokes THC occasionally per pt     Review of Systems Constitutional: Negative for fever.  Positive for generalized fatigue/weakness worse over the past 2 days Cardiovascular: Negative for chest pain. Respiratory: Mild shortness of breath.  Mild cough Gastrointestinal: Negative for abdominal pain, vomiting and diarrhea. Genitourinary: Negative for urinary compaints Musculoskeletal: Ulceration to the left heel Skin: Left heel ulceration Neurological: Negative for headache All other ROS negative  ____________________________________________   PHYSICAL EXAM:  VITAL SIGNS: ED Triage Vitals  Enc Vitals Group     BP 03/23/21 0029 (!) 104/44     Pulse Rate 03/23/21 0029 60     Resp 03/23/21 0029 16     Temp 03/23/21 0029 99.1 F (37.3 C)     Temp Source 03/23/21 0029 Oral     SpO2 03/23/21 0029 95 %     Weight 03/23/21 0031 269 lb (122 kg)     Height 03/23/21 0031 5\' 5"  (1.651 m)     Head Circumference --      Peak Flow --      Pain Score 03/23/21 0030 9     Pain Loc --      Pain Edu? --      Excl. in [place]? --    Constitutional: Alert and oriented. Well appearing and in no distress. Eyes: Normal exam ENT      Head: Normocephalic and atraumatic.  Mouth/Throat: Mucous membranes are  moist. Cardiovascular: Normal rate, regular rhythm.  Respiratory: Normal respiratory effort without tachypnea nor retractions. Breath sounds are clear Gastrointestinal: Soft and nontender. No distention. Musculoskeletal: Patient has an approximate 4 cm ulceration to the posterior left heel/ankle with discharge patient has erythema of the left lower extremity below the knee consistent with possible cellulitis. Neurologic:  Normal speech and language. No gross focal neurologic deficits Skin: Left heel ulceration as described above Psychiatric: Mood and affect are normal.   ____________________________________________    EKG  EKG viewed and interpreted by myself shows sinus bradycardia at 54 bpm with a narrow QRS, normal axis, normal intervals, no concerning ST changes.  ____________________________________________    RADIOLOGY  IMPRESSION:  No active cardiopulmonary disease.    IMPRESSION:  No acute bony abnormality.   ____________________________________________   INITIAL IMPRESSION / ASSESSMENT AND PLAN / ED COURSE  Pertinent labs & imaging results that were available during my care of the patient were reviewed by me and considered in my medical decision making (see chart for details).   Patient presents emergency department for generalized fatigue/weakness as well as shortness of breath.  Patient does appear fatigued in bed.  Reassuring physical exam besides left lower extremity has a somewhat chronic appearing ulceration to left posterior heel but with erythema and mild swelling of left lower extremity which the patient states is new/worsening.  Patient's work-up today shows slight leukocytosis, urinalysis consistent with urinary tract infection, chemistry consistent with acute kidney injury along with mild hyperkalemia.  We will IV hydrate, check blood cultures, start the patient on IV Rocephin and reassess.  Reassuringly troponin is negative.  COVID/flu negative.  Chest x-ray  pending.  Chest x-ray is negative for acute abnormality. Ankle x-ray negative for acute abnormality.  However given the patient's weakness signs of cellulitis on left lower extremity acute kidney injury and urinary tract infection we will admit to the hospital service for further work-up and treatment.  [name] was evaluated in Emergency Department on 03/23/2021 for the symptoms described in the history of present illness. She was evaluated in the context of the global COVID-19 pandemic, which necessitated consideration that the patient might be at risk for infection with the SARS-CoV-2 virus that causes COVID-19. Institutional protocols and algorithms that pertain to the evaluation of patients at risk for COVID-19 are in a state of rapid change based on information released by regulatory bodies including the CDC and federal and state organizations. These policies and algorithms were followed during the patient's care in the ED.  ____________________________________________   FINAL CLINICAL IMPRESSION(S) / ED DIAGNOSES  Weakness   Harvest Dark, MD 03/23/21 1000

## 2021-03-23 NOTE — H&P (Addendum)
History and Physical:    [name]   WUJ:[id number] DOB: Apr 03, 1951 DOA: 03/23/2021  Referring MD/provider: Harvest Dark, MD PCP: Kirk Ruths, MD   Patient coming from: Home  Chief Complaint: Generalized weakness  History of Present Illness:   [name] is a 70 y.o. female with medical history significant for neurogenic bladder with indwelling Foley catheter in place, CKD stage IIIa, ADHD, anxiety, COPD, B-cell follicular lymphoma, hypertension, hypotension, kyphoscoliosis, multiple sclerosis, left femur fracture requiring ORIF in October 2021, morbid obesity, PVD, and left heel decubitus ulcer that is managed at the wound care clinic.  She presented to the hospital because of generalized weakness and malaise.  She also complained of pain from the left heel decubitus ulcer with some drainage, painful swelling and redness of the left leg over the past few days.  She complains of cough with pleuritic chest pain and feeling a little short of breath.   She was recently seen at the wound care clinic on 03/20/2021 for debridement of left heel decubitus ulcer  No fever, chills, vomiting, diarrhea, abdominal pain, headache, dizziness, confusion, or any urinary symptoms.  ED Course:  The patient was given IV Rocephin and IV fluids in the ED  ROS:   ROS all other systems reviewed were negative  Past Medical History:   Past Medical History:  Diagnosis Date   Abdominal aortic atherosclerosis ([place]) 11/11/2016   ADHD    Anxiety    COPD (chronic obstructive pulmonary disease) (HCC)    Depression    major depressive   Dyspnea    doe   Edema    left leg   Follicular lymphoma (HCC)    B Cell   Follicular lymphoma grade II (HCC)    Hypertension    Hypotension    idiopathic   Kyphoscoliosis and scoliosis 11/26/2011   Morbid obesity ([place]) 01/05/2016   Multiple sclerosis (HCC)    Multiple sclerosis (HCC)    1980's   Neuromuscular disorder (HCC)    Obstructive and  reflux uropathy    foley   Pain    atypical facial   Peripheral vascular disease of lower extremity with ulceration ([place]) 11/08/2015   Skin ulcer ([place]) 11/08/2015   Weakness    generalized. has MS    Past Surgical History:   Past Surgical History:  Procedure Laterality Date   BACK SURGERY N/A 2002   CYST EXCISION     lower back   INGUINAL LYMPH NODE BIOPSY Left 07/04/2016   Procedure: INGUINAL LYMPH NODE BIOPSY;  Surgeon: Christene Lye, MD;  Location: [facility];  Service: General;  Laterality: Left;   ORIF FEMUR FRACTURE Left 02/04/2020   Procedure: OPEN REDUCTION INTERNAL FIXATION (ORIF) DISTAL FEMUR FRACTURE;  Surgeon: Shona Needles, MD;  Location: [place];  Service: Orthopedics;  Laterality: Left;   PORTACATH PLACEMENT N/A 07/22/2016   Procedure: INSERTION PORT-A-CATH;  Surgeon: Christene Lye, MD;  Location: [facility];  Service: General;  Laterality: N/A;   TONSILLECTOMY AND ADENOIDECTOMY     TUBAL LIGATION      Social History:   Social History   Socioeconomic History   Marital status: Married    Spouse name: Not on file   Number of children: 3   Years of education: Not on file   Highest education level: Not on file  Occupational History   Occupation: disabled  Tobacco Use   Smoking status: Former    Packs/day: 1.00    Years: 20.00  Pack years: 20.00    Types: Cigarettes    Start date: 04/30/1995    Quit date: 02/03/2016    Years since quitting: 5.1   Smokeless tobacco: Never  Vaping Use   Vaping Use: Some days  Substance and Sexual Activity   Alcohol use: No    Alcohol/week: 0.0 standard drinks   Drug use: Yes    Types: Marijuana    Comment: smokes THC occasionally per pt    Sexual activity: Not Currently    Birth control/protection: None  Other Topics Concern   Not on file  Social History Narrative   She is married.    Social Determinants of Health   Financial Resource Strain: Not on file  Food Insecurity: Not on file  Transportation  Needs: Not on file  Physical Activity: Not on file  Stress: Not on file  Social Connections: Not on file  Intimate Partner Violence: Not on file    Allergies   Patient has no known allergies.  Family history:   Family History  Problem Relation Age of Onset   COPD Mother    Diabetes Mother    Heart failure Mother    Alcohol abuse Father    Kidney disease Father    Kidney failure Father    Arthritis Sister    CAD Maternal Grandmother    Stroke Maternal Grandfather    Arthritis Sister    Mental illness Sister    Arthritis Brother     Current Medications:   Prior to Admission medications   Medication Sig Start Date End Date Taking? Authorizing Provider  acetaminophen (TYLENOL) 500 MG tablet Take 1,000 mg by mouth every 8 (eight) hours as needed for moderate pain.    [provider]  amLODipine (NORVASC) 5 MG tablet Take 5 mg by mouth daily. 09/17/19   [provider]  amoxicillin-clavulanate (AUGMENTIN) 875-125 MG tablet Take 1 tablet by mouth 2 (two) times daily.    [provider]  atorvastatin (LIPITOR) 10 MG tablet Take 10 mg by mouth at bedtime.  01/14/19   [provider]  baclofen (LIORESAL) 10 MG tablet Take 1 tablet (10 mg total) by mouth 2 (two) times daily. 02/10/20   Bonnielee Haff, MD  budesonide-formoterol [facility]) 160-4.5 MCG/ACT inhaler Inhale 2 puffs into the lungs 2 (two) times daily.     [provider]  clonazePAM (KLONOPIN) 0.5 MG tablet Take 1 tablet (0.5 mg total) by mouth 2 (two) times daily. 02/10/20   Bonnielee Haff, MD  cyanocobalamin 1000 MCG tablet Take 1,000 mcg by mouth daily.    [provider]  docusate sodium (COLACE) 100 MG capsule Take 1 capsule (100 mg total) by mouth 2 (two) times daily as needed. 11/24/19   Enzo Bi, MD  DULoxetine (CYMBALTA) 60 MG capsule Take 1 capsule (60 mg total) by mouth every morning. Patient taking differently: Take 30 mg by mouth daily. 10/14/16   Vaughan Basta, MD  enoxaparin (LOVENOX) 40 MG/0.4ML injection Inject 0.4 mLs (40 mg total) into the skin daily. 02/07/20 03/08/20  Haddix, Thomasene Lot, MD  famotidine (PEPCID) 10 MG tablet Take 10 mg by mouth daily.    [provider]  gabapentin (NEURONTIN) 600 MG tablet Take 600 mg by mouth 3 (three) times daily.    [provider]  hydrALAZINE (APRESOLINE) 50 MG tablet Take 50 mg by mouth 3 (three) times daily.    [provider]  hydrOXYzine (ATARAX/VISTARIL) 25 MG tablet Take 25 mg by mouth 3 (  three) times daily as needed.    [provider]  lisinopril (ZESTRIL) 20 MG tablet Take 0.5 tablets (10 mg total) by mouth daily for 10 days. 02/01/20 02/11/20  Shahmehdi, Valeria Batman, MD  magnesium oxide (MAG-OX) 400 MG tablet Take 400 mg by mouth daily.    [provider]  Melatonin 5 MG CAPS Take by mouth.    [provider]  Multiple Vitamin (MULTIVITAMIN WITH MINERALS) TABS tablet Take 1 tablet by mouth daily.    [provider]  MYRBETRIQ 50 MG TB24 tablet TAKE ONE TABLET BY MOUTH ONCE DAILY 04/19/20   Stoioff, Ronda Fairly, MD  oxyCODONE (OXY IR/ROXICODONE) 5 MG immediate release tablet Take 1 tablet (5 mg total) by mouth every 4 (four) hours as needed for moderate pain (pain rated 4-6). 02/07/20   Haddix, Thomasene Lot, MD  polyethylene glycol powder (GLYCOLAX/MIRALAX) 17 GM/SCOOP powder Take 17 g by mouth daily as needed for mild constipation. 05/01/19   [provider]  QUEtiapine Fumarate (SEROQUEL XR) 150 MG 24 hr tablet Take 150 mg by mouth at bedtime.     [provider]  vortioxetine HBr (TRINTELLIX) 5 MG TABS tablet Take 5 mg by mouth daily.    [provider]    Physical Exam:   Vitals:   03/23/21 0029 03/23/21 0031 03/23/21 0653 03/23/21 1042  BP: (!) 104/44  (!) 102/43 119/64  Pulse: 60  (!) 49 60  Resp: 16  20 18   Temp: 99.1 F (37.3 C)   (!) 97.5 F (36.4 C)  TempSrc: Oral   Oral  SpO2: 95%  96% 97%   Weight:  122 kg    Height:  5\' 5"  (1.651 m)       Physical Exam: Blood pressure 119/64, pulse 60, temperature (!) 97.5 F (36.4 C), temperature source Oral, resp. rate 18, height 5\' 5"  (1.651 m), weight 122 kg, SpO2 97 %. Gen: No acute distress. Head: Normocephalic, atraumatic. Eyes: Pupils equal, round and reactive to light. Extraocular movements intact.  Sclerae nonicteric.  Mouth: Dry mucous membranes Neck: Supple, no thyromegaly, no lymphadenopathy, no jugular venous distention. Chest: Air entry adequate bilaterally.  No wheezing or rales heard. CV: Heart sounds are regular with an S1, S2. No murmurs, rubs or gallops.  Abdomen: Soft, nontender, obese, + normal bowel sounds.  No palpable masses. Extremities: Swelling, erythema and tenderness of the left leg. Skin: Warm and dry.  Unstageable left heel decubitus wound with some serosanguineous drainage on the dressing Neuro: Alert and oriented times 3; grossly nonfocal.  Psych: Insight is good and judgment is appropriate. Mood and affect normal. GU: Foley catheter with amber urine.  No CVA tenderness   Data Review:    Labs: Basic Metabolic Panel: Recent Labs  Lab 03/23/21 0128  NA 131*  K 5.2*  CL 103  CO2 22  GLUCOSE 102*  BUN 45*  CREATININE 2.22*  CALCIUM 8.3*  MG 2.5*   Liver Function Tests: Recent Labs  Lab 03/23/21 0128  AST 15  ALT 16  ALKPHOS 54  BILITOT 0.7  PROT 6.4*  ALBUMIN 3.6   No results for input(s): LIPASE, AMYLASE in the last 168 hours. No results for input(s): AMMONIA in the last 168 hours. CBC: Recent Labs  Lab 03/23/21 0128  WBC 10.6*  NEUTROABS 8.8*  HGB 8.9*  HCT 28.3*  MCV 87.9  PLT 179   Cardiac Enzymes: No results for input(s): CKTOTAL, CKMB, CKMBINDEX, TROPONINI in the last 168 hours.  BNP (  last 3 results) No results for input(s): PROBNP in the last 8760 hours. CBG: Recent Labs  Lab 03/23/21 0649  GLUCAP 98    Urinalysis    Component Value Date/Time    COLORURINE YELLOW 03/23/2021 0541   APPEARANCEUR CLEAR 03/23/2021 0541   APPEARANCEUR Hazy 08/02/2013 0020   LABSPEC 1.015 03/23/2021 0541   LABSPEC 1.012 08/02/2013 0020   PHURINE 5.5 03/23/2021 0541   GLUCOSEU NEGATIVE 03/23/2021 0541   GLUCOSEU Negative 08/02/2013 0020   HGBUR NEGATIVE 03/23/2021 0541   BILIRUBINUR SMALL (A) 03/23/2021 0541   BILIRUBINUR Negative 08/02/2013 0020   KETONESUR TRACE (A) 03/23/2021 0541   PROTEINUR 30 (A) 03/23/2021 0541   NITRITE NEGATIVE 03/23/2021 0541   LEUKOCYTESUR LARGE (A) 03/23/2021 0541   LEUKOCYTESUR 2+ 08/02/2013 0020      Radiographic Studies: DG Ankle Complete Left  Result Date: 03/23/2021 CLINICAL DATA:  Ulceration, infection EXAM: LEFT ANKLE COMPLETE - 3+ VIEW COMPARISON:  None. FINDINGS: No acute bony abnormality. Specifically, no fracture, subluxation, or dislocation. No bone destruction to suggest osteomyelitis. IMPRESSION: No acute bony abnormality. Electronically Signed   By: Rolm Baptise M.D.   On: 03/23/2021 09:53   DG Chest Portable 1 View  Result Date: 03/23/2021 CLINICAL DATA:  Shortness of breath, generalized weakness EXAM: PORTABLE CHEST 1 VIEW COMPARISON:  01/26/2020 FINDINGS: Low lung volumes. Heart and mediastinal contours are within normal limits. No focal opacities or effusions. No acute bony abnormality. IMPRESSION: No active cardiopulmonary disease. Electronically Signed   By: Rolm Baptise M.D.   On: 03/23/2021 09:13    EKG: Independently reviewed by me showed normal sinus rhythm, low voltage.   Assessment/Plan:   Principal Problem:   AKI (acute kidney injury) ([place]) Active Problems:   Cellulitis of left lower extremity   Body mass index is 44.76 kg/m.  (Morbid obesity)  Unstageable left heel decubitus ulcer with left leg cellulitis: Admit to MedSurg.  Treat with empiric IV antibiotics.  Analgesics as needed for pain.  Follow-up blood cultures.  Left heel decubitus ulcer was recently debrided at wound care  clinic on 03/20/2021.  AKI with hyperkalemia, CKD stage IIIa: Hold lisinopril.  Hydrate with IV fluids and monitor BMP.  Hyponatremia: Treat with IV fluids  Hypertension: Hold antihypertensives and monitor BP closely  COPD: Compensated.  Continue bronchodilators  Neurogenic bladder with chronic indwelling Foley catheter with pyuria: She does not have symptoms suggestive of UTI.  History of multiple sclerosis, generalized weakness: PT and OT evaluation  Plan of care was discussed with the patient.  She wishes to be a full code at this time.  Tiffany, RN, was present at the bedside during this encounter.  Other information:   DVT prophylaxis:   Heparin  Code Status: Full code. Family Communication: None  Disposition Plan: Plan to discharge Home in 2 to 3 days Consults called: None Admission status: Inpatient  The medical decision making on this patient was of high complexity and the patient is at high risk for clinical deterioration, therefore this is a level 3 visit.     Shaniquia Brafford [facility] Pager: Please check [email or web address]   How to contact the [facility] Attending or Consulting provider [place] or covering provider during after hours [place], for this patient?   Check the care team in [facility] and look for a) attending/consulting [facility] provider listed and b) the [facility] team listed Log into [email or web address] and use [place]'s universal password to access. If you do not have the password, please contact  the hospital operator. Locate the [facility] provider you are looking for under [facility] and page to a number that you can be directly reached. If you still have difficulty reaching the provider, please page the [facility] (Director on Call) for the Hospitalists listed on amion for assistance.  03/23/2021, 11:51 AM

## 2021-03-23 NOTE — ED Notes (Signed)
Pt's foley bag taped up and leaking from several locations. Foley bag changed. Sheets changed, warm blankets applied.

## 2021-03-23 NOTE — ED Triage Notes (Signed)
EMS reports they were called out due to generalized weakness today.

## 2021-03-24 DIAGNOSIS — L03116 Cellulitis of left lower limb: Secondary | ICD-10-CM

## 2021-03-24 LAB — BASIC METABOLIC PANEL
Anion gap: 5 (ref 5–15)
BUN: 46 mg/dL — ABNORMAL HIGH (ref 8–23)
CO2: 23 mmol/L (ref 22–32)
Calcium: 8.6 mg/dL — ABNORMAL LOW (ref 8.9–10.3)
Chloride: 106 mmol/L (ref 98–111)
Creatinine, Ser: 2.23 mg/dL — ABNORMAL HIGH (ref 0.44–1.00)
GFR, Estimated: 23 mL/min — ABNORMAL LOW (ref >60)
Glucose, Bld: 128 mg/dL — ABNORMAL HIGH (ref 70–99)
Potassium: 4.8 mmol/L (ref 3.5–5.1)
Sodium: 134 mmol/L — ABNORMAL LOW (ref 135–145)

## 2021-03-24 LAB — CBC
HCT: 26.6 % — ABNORMAL LOW (ref 36.0–46.0)
Hemoglobin: 8.5 g/dL — ABNORMAL LOW (ref 12.0–15.0)
MCH: 28.1 pg (ref 26.0–34.0)
MCHC: 32 g/dL (ref 30.0–36.0)
MCV: 87.8 fL (ref 80.0–100.0)
Platelets: 151 10*3/uL (ref 150–400)
RBC: 3.03 MIL/uL — ABNORMAL LOW (ref 3.87–5.11)
RDW: 14.9 % (ref 11.5–15.5)
WBC: 6.8 10*3/uL (ref 4.0–10.5)
nRBC: 0 % (ref 0.0–0.2)

## 2021-03-24 LAB — HIV ANTIBODY (ROUTINE TESTING W REFLEX): HIV Screen 4th Generation wRfx: NONREACTIVE

## 2021-03-24 MED ORDER — CHLORHEXIDINE GLUCONATE CLOTH 2 % EX PADS
6.0000 | MEDICATED_PAD | Freq: Every day | CUTANEOUS | Status: DC
  Administered 2021-03-24 – 2021-03-29 (×6): 6 via TOPICAL

## 2021-03-24 MED ORDER — SODIUM CHLORIDE 0.9 % IV SOLN: INTRAVENOUS | Status: DC

## 2021-03-24 NOTE — Progress Notes (Signed)
PROGRESS NOTE    [name]  NOM:[id number] DOB: [birth year] DOA: 03/23/2021 PCP: Kirk Ruths, MD   Assessment & Plan:   Principal Problem:   AKI (acute kidney injury) [facility]) Active Problems:   Cellulitis of left lower extremity   Unstageable left heel decubitus ulcer & left leg cellulitis: continue on IV rocephin. Blood cxs NGTD. S/p left heel decubitus ulcer was debrided at wound care clinic on 03/20/2021. Podiatry consulted  Hyperkalemia: resolved   AKI on CKDIIIa: Cr is trending slightly up today. Continue on IVFs. Avoid nephrotoxic meds    Hyponatremia: treating up w/ IVFs  HTN: hold home dose of amlodipine, lisinopril, torsemide as BP is low normal   COPD: w/o exacerbation. Continue on bronchodilators and encourage incentive spirometry   Neurogenic bladder: w/ chronic indwelling foley catheter. No symptoms of UTI but urine cx is pending   Generalized weakness: PT and OT evaluation. Hx of MS   Morbid obesity: BMI 50.4. Complicates overall care & prognosis    DVT prophylaxis: heparin  Code Status: full  Family Communication:  Disposition Plan: depends on PT/OT recs   Level of care: Med-Surg  Status is: Inpatient  Remains inpatient appropriate because: severity of illness    Consultants:  Podiatry   Procedures:   Antimicrobials: rocephin    Subjective: Pt c/o left foot pain   Objective: Vitals:   03/23/21 1712 03/23/21 2054 03/23/21 2100 03/24/21 0426  BP: 122/64 122/70  115/65  Pulse: 68 60  65  Resp: 18 18  18   Temp: 98.1 F (36.7 C) 98.7 F (37.1 C)  98.5 F (36.9 C)  TempSrc: Oral Oral    SpO2: 95% 95%  95%  Weight:   133.4 kg   Height:   5\' 4"  (1.626 m)     Intake/Output Summary (Last 24 hours) at 03/24/2021 0749 Last data filed at 03/23/2021 1857 Gross per 24 hour  Intake 1100 ml  Output 900 ml  Net 200 ml   Filed Weights   03/23/21 0031 03/23/21 2100  Weight: 122 kg 133.4 kg    Examination:  General exam:  Appears calm and comfortable  Respiratory system: Clear to auscultation. Respiratory effort normal. Cardiovascular system: S1 & S2 +. No rubs, gallops or clicks.  Gastrointestinal system: Abdomen is obese, soft and nontender. Normal bowel sounds heard. Central nervous system: Alert and oriented. Moves all extremities  Extremities: left heel has open ulcer draining sanguinous fluid and likely pus Psychiatry: Judgement and insight appear normal. Flat mood and affect   Data Reviewed: I have personally reviewed following labs and imaging studies  CBC: Recent Labs  Lab 03/23/21 0128 03/24/21 0558  WBC 10.6* 6.8  NEUTROABS 8.8*  --   HGB 8.9* 8.5*  HCT 28.3* 26.6*  MCV 87.9 87.8  PLT 179 962   Basic Metabolic Panel: Recent Labs  Lab 03/23/21 0128 03/24/21 0558  NA 131* 134*  K 5.2* 4.8  CL 103 106  CO2 22 23  GLUCOSE 102* 128*  BUN 45* 46*  CREATININE 2.22* 2.23*  CALCIUM 8.3* 8.6*  MG 2.5*  --    GFR: Estimated Creatinine Clearance: 31.9 mL/min (A) (by C-G formula based on SCr of 2.23 mg/dL (H)). Liver Function Tests: Recent Labs  Lab 03/23/21 0128  AST 15  ALT 16  ALKPHOS 54  BILITOT 0.7  PROT 6.4*  ALBUMIN 3.6   No results for input(s): LIPASE, AMYLASE in the last 168 hours. No results for input(s): AMMONIA in the last 168  hours. Coagulation Profile: No results for input(s): INR, PROTIME in the last 168 hours. Cardiac Enzymes: No results for input(s): CKTOTAL, CKMB, CKMBINDEX, TROPONINI in the last 168 hours. BNP (last 3 results) No results for input(s): PROBNP in the last 8760 hours. HbA1C: No results for input(s): HGBA1C in the last 72 hours. CBG: Recent Labs  Lab 03/23/21 0649  GLUCAP 98   Lipid Profile: No results for input(s): CHOL, HDL, LDLCALC, TRIG, CHOLHDL, LDLDIRECT in the last 72 hours. Thyroid Function Tests: Recent Labs    03/23/21 0128  TSH 3.559   Anemia Panel: No results for input(s): VITAMINB12, FOLATE, FERRITIN, TIBC, IRON,  RETICCTPCT in the last 72 hours. Sepsis Labs: No results for input(s): PROCALCITON, LATICACIDVEN in the last 168 hours.  Recent Results (from the past 240 hour(s))  Resp Panel by RT-PCR (Flu A&B, Covid) Nasopharyngeal Swab     Status: None   Collection Time: 03/23/21  1:28 AM   Specimen: Nasopharyngeal Swab; Nasopharyngeal(NP) swabs in vial transport medium  Result Value Ref Range Status   SARS Coronavirus 2 by RT PCR NEGATIVE NEGATIVE Final    Comment: (NOTE) SARS-CoV-2 target nucleic acids are NOT DETECTED.  The SARS-CoV-2 RNA is generally detectable in upper respiratory specimens during the acute phase of infection. The lowest concentration of SARS-CoV-2 viral copies this assay can detect is 138 copies/mL. A negative result does not preclude SARS-Cov-2 infection and should not be used as the sole basis for treatment or other patient management decisions. A negative result may occur with  improper specimen collection/handling, submission of specimen other than nasopharyngeal swab, presence of viral mutation(s) within the areas targeted by this assay, and inadequate number of viral copies(<138 copies/mL). A negative result must be combined with clinical observations, patient history, and epidemiological information. The expected result is Negative.  Fact Sheet for Patients:  EntrepreneurPulse.com.au  Fact Sheet for Healthcare Providers:  IncredibleEmployment.be  This test is no t yet approved or cleared by the [place] FDA and  has been authorized for detection and/or diagnosis of SARS-CoV-2 by FDA under an Emergency Use Authorization (EUA). This EUA will remain  in effect (meaning this test can be used) for the duration of the COVID-19 declaration under Section 564(b)(1) of the Act, 21 U.S.C.section 360bbb-3(b)(1), unless the authorization is terminated  or revoked sooner.       Influenza A by PCR NEGATIVE NEGATIVE Final   Influenza  B by PCR NEGATIVE NEGATIVE Final    Comment: (NOTE) The Xpert Xpress SARS-CoV-2/FLU/RSV plus assay is intended as an aid in the diagnosis of influenza from Nasopharyngeal swab specimens and should not be used as a sole basis for treatment. Nasal washings and aspirates are unacceptable for Xpert Xpress SARS-CoV-2/FLU/RSV testing.  Fact Sheet for Patients: EntrepreneurPulse.com.au  Fact Sheet for Healthcare Providers: IncredibleEmployment.be  This test is not yet approved or cleared by the [place] FDA and has been authorized for detection and/or diagnosis of SARS-CoV-2 by FDA under an Emergency Use Authorization (EUA). This EUA will remain in effect (meaning this test can be used) for the duration of the COVID-19 declaration under Section 564(b)(1) of the Act, 21 U.S.C. section 360bbb-3(b)(1), unless the authorization is terminated or revoked.  Performed at [facility], [address] [place], [place] [address]   Blood culture (routine x 2)     Status: None (Preliminary result)   Collection Time: 03/23/21 10:33 AM   Specimen: BLOOD  Result Value Ref Range Status   Specimen Description BLOOD BLOOD RIGHT  HAND  Final   Special Requests   Final    BOTTLES DRAWN AEROBIC AND ANAEROBIC Blood Culture results may not be optimal due to an inadequate volume of blood received in culture bottles   Culture   Final    NO GROWTH < 24 HOURS Performed at [facility], [address] [place], [place] [address]    Report Status PENDING  Incomplete  Blood culture (routine x 2)     Status: None (Preliminary result)   Collection Time: 03/23/21  8:14 PM   Specimen: BLOOD  Result Value Ref Range Status   Specimen Description BLOOD LEFT ANTECUBITAL  Final   Special Requests   Final    BOTTLES DRAWN AEROBIC AND ANAEROBIC Blood Culture adequate volume   Culture   Final    NO GROWTH < 12 HOURS Performed at [facility], [address], [place], [place] [address]    Report Status PENDING  Incomplete         Radiology Studies: DG Ankle Complete Left  Result Date: 03/23/2021 CLINICAL DATA:  Ulceration, infection EXAM: LEFT ANKLE COMPLETE - 3+ VIEW COMPARISON:  None. FINDINGS: No acute bony abnormality. Specifically, no fracture, subluxation, or dislocation. No bone destruction to suggest osteomyelitis. IMPRESSION: No acute bony abnormality. Electronically Signed   By: Rolm Baptise M.D.   On: 03/23/2021 09:53   DG Chest Portable 1 View  Result Date: 03/23/2021 CLINICAL DATA:  Shortness of breath, generalized weakness EXAM: PORTABLE CHEST 1 VIEW COMPARISON:  01/26/2020 FINDINGS: Low lung volumes. Heart and mediastinal contours are within normal limits. No focal opacities or effusions. No acute bony abnormality. IMPRESSION: No active cardiopulmonary disease. Electronically Signed   By: Rolm Baptise M.D.   On: 03/23/2021 09:13        Scheduled Meds:  buPROPion  300 mg Oral Daily   Chlorhexidine Gluconate Cloth  6 each Topical Daily   DULoxetine  60 mg Oral [id number]   heparin  5,000 Units Subcutaneous Q8H   mirabegron ER  50 mg Oral Daily   QUEtiapine Fumarate  150 mg Oral QHS   traZODone  50 mg Oral QHS   vortioxetine HBr  5 mg Oral Daily   Continuous Infusions:  cefTRIAXone (ROCEPHIN)  IV       LOS: 1 day    Time spent: 25 mins     Wyvonnia Dusky, MD [facility] Pager [phone]  If 7PM-7AM, please contact night-coverage 03/24/2021, 7:49 AM

## 2021-03-24 NOTE — Consult Note (Signed)
Subjective:  Patient ID: [name], female    DOB: [birth year],  MRN: [id number]  Patient with past medical history of neurogenic bladder with indwelling Foley catheter in place, CKD stage IIIa, ADHD, anxiety, COPD, B-cell follicular lymphoma, hypertension, hypotension, kyphoscoliosis, multiple sclerosis, left femur fracture requiring ORIF in October 2021, morbid obesity, PVD seen at beside today for left decubitus heel ulcer. She has been followed by wound care and recently had the heel debrided. Has been offloading and dressing daily. Relates pain to the heel especialy when debrided. Denies any nausea, vomiting, fever and chills    Past Medical History:  Diagnosis Date   Abdominal aortic atherosclerosis ([place]) 11/11/2016   ADHD    Anxiety    COPD (chronic obstructive pulmonary disease) (HCC)    Depression    major depressive   Dyspnea    doe   Edema    left leg   Follicular lymphoma (HCC)    B Cell   Follicular lymphoma grade II (HCC)    Hypertension    Hypotension    idiopathic   Kyphoscoliosis and scoliosis 11/26/2011   Morbid obesity ([place]) 01/05/2016   Multiple sclerosis (HCC)    Multiple sclerosis ([place])    1980's   Neuromuscular disorder ([place])    Obstructive and reflux uropathy    foley   Pain    atypical facial   Peripheral vascular disease of lower extremity with ulceration ([place]) 11/08/2015   Skin ulcer ([place]) 11/08/2015   Weakness    generalized. has MS     Past Surgical History:  Procedure Laterality Date   BACK SURGERY N/A 2002   CYST EXCISION     lower back   INGUINAL LYMPH NODE BIOPSY Left 07/04/2016   Procedure: INGUINAL LYMPH NODE BIOPSY;  Surgeon: Christene Lye, MD;  Location: [facility];  Service: General;  Laterality: Left;   ORIF FEMUR FRACTURE Left 02/04/2020   Procedure: OPEN REDUCTION INTERNAL FIXATION (ORIF) DISTAL FEMUR FRACTURE;  Surgeon: Shona Needles, MD;  Location: [facility];  Service: Orthopedics;  Laterality: Left;   PORTACATH PLACEMENT N/A  07/22/2016   Procedure: INSERTION PORT-A-CATH;  Surgeon: Christene Lye, MD;  Location: [facility];  Service: General;  Laterality: N/A;   TONSILLECTOMY AND ADENOIDECTOMY     TUBAL LIGATION      CBC Latest Ref Rng & Units 03/24/2021 03/23/2021 02/10/2020  WBC 4.0 - 10.5 K/uL 6.8 10.6(H) 8.4  Hemoglobin 12.0 - 15.0 g/dL 8.5(L) 8.9(L) 8.5(L)  Hematocrit 36.0 - 46.0 % 26.6(L) 28.3(L) 27.4(L)  Platelets 150 - 400 K/uL 151 179 224    BMP Latest Ref Rng & Units 03/24/2021 03/23/2021 03/16/2020  Glucose 70 - 99 mg/dL 128(H) 102(H) -  BUN 8 - 23 mg/dL 46(H) 45(H) -  Creatinine 0.44 - 1.00 mg/dL 2.23(H) 2.22(H) 1.00  BUN/Creat Ratio 6 - 22 (calc) - - -  Sodium 135 - 145 mmol/L 134(L) 131(L) -  Potassium 3.5 - 5.1 mmol/L 4.8 5.2(H) -  Chloride 98 - 111 mmol/L 106 103 -  CO2 22 - 32 mmol/L 23 22 -  Calcium 8.9 - 10.3 mg/dL 8.6(L) 8.3(L) -     Objective:   Vitals:   03/24/21 0426 03/24/21 0804  BP: 115/65 (!) 102/53  Pulse: 65 (!) 55  Resp: 18 18  Temp: 98.5 F (36.9 C) 99.4 F (37.4 C)  SpO2: 95% 100%    General:AA&O x 3. Normal mood and affect   Vascular: DP and PT pulses 1/4 bilateral. Brisk capillary  refill to all digits. Pedal hair present   Neruological. Epicritic sensation grossly intact.   Derm: Left heel wound with mixed necrotic and granular base measuring 4 cm x 5 cm x 0.1 cm . Mild erythema surrounding No purulence. No probe to bone. Interspaces clears of maceration. Nails well groomed and normal in appearance  MSK: MMT 5/5 in dorsiflexion, plantar flexion, inversion and eversion. Normal joint ROM without pain or crepitus. Tender to wound area.    Assessment & Plan:  Patient was evaluated and treated and all questions answered.  DX: Left decubitus heel ulcer Wound care: betadine, DSD. Keep heel offloaded and elevated in bed.  Antibiotics: Continue per primary  DME: PRAFO offloading heel boot   Discussed with patient diagnosis and treatment options.   Imaging reviewed. No osseous destruction noted.  Recommend ABIs to assess blood flow.  Discussed continued wound care and antibiotics. Do not feel at this time any surgical intervention is necessary.  Patient in agreement with plan and all questions answered.  Will follow-up with wound care and podiatry upon discharge.  Podiatry to sign off at this time.   Lorenda Peck, MD  Accessible via secure chat for questions or concerns.

## 2021-03-24 NOTE — Evaluation (Signed)
Occupational Therapy Evaluation Patient Details Name: [name] MRN: [id number] DOB: 07-Jan-1951 Today's Date: 03/24/2021   History of Present Illness [name] is a 70 y.o. female with medical history significant for neurogenic bladder with indwelling Foley catheter in place, CKD stage IIIa, ADHD, anxiety, COPD, B-cell follicular lymphoma, hypertension, hypotension, kyphoscoliosis, multiple sclerosis, left femur fracture requiring ORIF in October 2021, morbid obesity, PVD, and left heel decubitus ulcer that is managed at the wound care clinic. BIB by EMS for genealized weakness and being tx'ed for AKI.   Clinical Impression   Pt seen for OT evaluation this date in setting of acute hospitalization d/t AKI. Pt reports she is amb with RW at baseline and sleeps in a lift chair, as does her spouse. She presents this date with significant weakness, requiring MAX A to come to EOB sitting. She requires MAX A for LB dressing bed level. She declines to attempt standing with OT this date citing pain. She completes UB g/h tasks including combing hair in sitting EOB with SETUP. She requires MAX A for repositioning back in bed and OT sets pt up in chair position for meal time. Pt left with all needs met and in reach. Will continue to follow acutely. Anticipate pt will require STR OT f/u.      Recommendations for follow up therapy are one component of a multi-disciplinary discharge planning process, led by the attending physician.  Recommendations may be updated based on patient status, additional functional criteria and insurance authorization.   Follow Up Recommendations  Skilled nursing-short term rehab (<3 hours/day)    Assistance Recommended at Discharge    Functional Status Assessment  Patient has had a recent decline in their functional status and demonstrates the ability to make significant improvements in function in a reasonable and predictable amount of time.  Equipment Recommendations   BSC/3in1;Tub/shower seat;Other (comment) (2ww)    Recommendations for Other Services       Precautions / Restrictions Precautions Precautions: Fall Restrictions Weight Bearing Restrictions: No      Mobility Bed Mobility Overal bed mobility: Needs Assistance Bed Mobility: Supine to Sit;Sit to Supine     Supine to sit: Mod assist;Max assist Sit to supine: Mod assist;Max assist        Transfers Overall transfer level: Needs assistance   Transfers: Bed to chair/wheelchair/BSC            Lateral/Scoot Transfers: Max assist General transfer comment: MAX A to lateral scoot towards HOB      Balance Overall balance assessment: Needs assistance   Sitting balance-Leahy Scale: Poor Sitting balance - Comments: posterior lean, apparently somewhat fearful of falling Postural control: Posterior lean     Standing balance comment: deferred at pt request                           ADL either performed or assessed with clinical judgement   ADL                                         General ADL Comments: requires SETUP for seated UB ADLs, MAX A for seated/bed level LB ADLs. Requires MAX A for bed mobility. Deferred standing at pt request, citing pain.     Vision Patient Visual Report: No change from baseline       Perception     Praxis  Pertinent Vitals/Pain Pain Assessment: 0-10 Pain Score: 4  Pain Location: L heel Pain Descriptors / Indicators: Tender Pain Intervention(s): Limited activity within patient's tolerance;Monitored during session;Repositioned (elevated, heel floated)     Hand Dominance Right   Extremity/Trunk Assessment Upper Extremity Assessment Upper Extremity Assessment: Generalized weakness   Lower Extremity Assessment Lower Extremity Assessment: Generalized weakness       Communication Communication Communication: No difficulties   Cognition Arousal/Alertness: Awake/alert Behavior During Therapy: WFL  for tasks assessed/performed Overall Cognitive Status: Within Functional Limits for tasks assessed                                       General Comments       Exercises Other Exercises Other Exercises: OT engages pt in sup to sit and seated ADLs including combing hair with SETUP. Ed re: importance of OOB Activity.   Shoulder Instructions      Home Living Family/patient expects to be discharged to:: Private residence Living Arrangements: Spouse/significant other Available Help at Discharge: Family;Available PRN/intermittently;Friend(s) Type of Home: House Home Access: Stairs to enter;Ramped entrance Entrance Stairs-Number of Steps: has a ramp, then up two more steps Entrance Stairs-Rails: None Home Layout: One level     Bathroom Shower/Tub: Occupational psychologist: Handicapped height (BSC over toilet) Bathroom Accessibility: Yes   Home Equipment: Wheelchair - manual;Rollator (4 wheels);Grab bars - tub/shower;Shower seat;BSC/3in1 (pt and spouse both sleep in a lift chair)   Additional Comments: Pt reports husband not much help at home. Has a friend that helps with bill management, but unable to help physically      Prior Functioning/Environment Prior Level of Function : Needs assist       Physical Assist : Mobility (physical);ADLs (physical)     Mobility Comments: states she is usually able to mobilize with RW at home, but endorses use of w/c occasionally and for further distances. Reports she is unable to amb far w/in the home as her husband has it very "cluttered" ADLs Comments: Pt notes that she is so exhausted after showering she has difficulty drying off and getting dressed.        OT Problem List: Decreased strength;Decreased activity tolerance;Impaired balance (sitting and/or standing);Obesity;Pain;Increased edema      OT Treatment/Interventions: Self-care/ADL training;Therapeutic exercise;DME and/or AE instruction;Therapeutic  activities;Patient/family education;Balance training    OT Goals(Current goals can be found in the care plan section) Acute Rehab OT Goals Patient Stated Goal: to go home OT Goal Formulation: With patient Time For Goal Achievement: 04/07/21 Potential to Achieve Goals: Good  OT Frequency: Min 2X/week   Barriers to D/C:            Co-evaluation              AM-PAC OT "6 Clicks" Daily Activity     Outcome Measure Help from another person eating meals?: None Help from another person taking care of personal grooming?: A Little Help from another person toileting, which includes using toliet, bedpan, or urinal?: A Lot Help from another person bathing (including washing, rinsing, drying)?: A Lot Help from another person to put on and taking off regular upper body clothing?: A Little Help from another person to put on and taking off regular lower body clothing?: A Lot 6 Click Score: 16   End of Session Equipment Utilized During Treatment: Gait belt;Rolling walker (2 wheels)  Activity Tolerance: Patient tolerated treatment well  Patient left: in bed;with call bell/phone within reach;with bed alarm set;Other (comment) (chair position, SETUP for meal time)  OT Visit Diagnosis: Unsteadiness on feet (R26.81);Muscle weakness (generalized) (M62.81);Pain Pain - Right/Left: Left Pain - part of body:  (heel)                Time: 0960-4540 OT Time Calculation (min): 33 min Charges:  OT General Charges $OT Visit: 1 Visit OT Evaluation $OT Eval Moderate Complexity: 1 Mod OT Treatments $Self Care/Home Management : 8-22 mins  Gerrianne Scale, MS, OTR/L ascom [phone] 03/24/21, 2:55 PM

## 2021-03-25 LAB — CBC
HCT: 25.7 % — ABNORMAL LOW (ref 36.0–46.0)
Hemoglobin: 8.2 g/dL — ABNORMAL LOW (ref 12.0–15.0)
MCH: 28.4 pg (ref 26.0–34.0)
MCHC: 31.9 g/dL (ref 30.0–36.0)
MCV: 88.9 fL (ref 80.0–100.0)
Platelets: 158 10*3/uL (ref 150–400)
RBC: 2.89 MIL/uL — ABNORMAL LOW (ref 3.87–5.11)
RDW: 14.8 % (ref 11.5–15.5)
WBC: 5 10*3/uL (ref 4.0–10.5)
nRBC: 0 % (ref 0.0–0.2)

## 2021-03-25 LAB — PROCALCITONIN: Procalcitonin: <0.1 ng/mL

## 2021-03-25 LAB — BASIC METABOLIC PANEL
Anion gap: 4 — ABNORMAL LOW (ref 5–15)
BUN: 37 mg/dL — ABNORMAL HIGH (ref 8–23)
CO2: 23 mmol/L (ref 22–32)
Calcium: 8.4 mg/dL — ABNORMAL LOW (ref 8.9–10.3)
Chloride: 109 mmol/L (ref 98–111)
Creatinine, Ser: 1.66 mg/dL — ABNORMAL HIGH (ref 0.44–1.00)
GFR, Estimated: 33 mL/min — ABNORMAL LOW (ref >60)
Glucose, Bld: 91 mg/dL (ref 70–99)
Potassium: 4.2 mmol/L (ref 3.5–5.1)
Sodium: 136 mmol/L (ref 135–145)

## 2021-03-25 NOTE — Progress Notes (Signed)
PROGRESS NOTE    ERA PARR  XNT:[id number] DOB: Jun 14, 1950 DOA: 03/23/2021 PCP: Kirk Ruths, MD   Assessment & Plan:   Principal Problem:   AKI (acute kidney injury) [facility]) Active Problems:   Cellulitis of left lower extremity   Unstageable left heel decubitus ulcer & left leg cellulitis: continue on IV rocephin. Blood cxs NGTD. S/p left heel decubitus ulcer was debrided at wound care clinic on 03/20/2021. Continue w/ wound care as per podiatry. ABI ordered. No further debridements needed at time as per podiatry. Will need f/u outpatient w/ podiatry   Hyperkalemia: resolved   AKI on CKDIIIa: Cr is trending down from day prior.    Hyponatremia: resolved   HTN: hold home dose of amlodipine, lisinopril, torsemide as BP is low normal   COPD: w/o exacerbation. Continue on bronchodilators and encourage incentive spirometry   Neurogenic bladder: w/ chronic indwelling foley catheter. No symptoms of UTI but urine cx was done on admission and is growing enterococcus faecalis, proteus, & pseudomonas likely secondary to chronic indwelling foley cathter. Normal WBC and no fevers. Will monitor   Generalized weakness: hx of MS. OT recs SNF. Pt wants to think about SNF   Morbid obesity: BMI 50.4. Complicates overall care & prognosis    DVT prophylaxis: heparin  Code Status: full  Family Communication:  Disposition Plan: OT recs SNF   Level of care: Med-Surg  Status is: Inpatient  Remains inpatient appropriate because: severity of illness    Consultants:  Podiatry   Procedures:   Antimicrobials: rocephin    Subjective: Pt c/o malaise   Objective: Vitals:   03/24/21 1620 03/24/21 2212 03/25/21 0438 03/25/21 0753  BP: 104/88 (!) 102/54 102/60 (!) 118/47  Pulse: (!) 51 (!) 46 60 70  Resp: 18 20 18 20   Temp: 98.8 F (37.1 C) 98.6 F (37 C) 98.6 F (37 C) 98.2 F (36.8 C)  TempSrc: Oral Oral  Oral  SpO2: 95% 97% 97% 98%  Weight:      Height:         Intake/Output Summary (Last 24 hours) at 03/25/2021 0820 Last data filed at 03/25/2021 0733 Gross per 24 hour  Intake 1670.29 ml  Output 1000 ml  Net 670.29 ml   Filed Weights   03/23/21 0031 03/23/21 2100  Weight: 122 kg 133.4 kg    Examination:  General exam: Appears comfortable  Respiratory system: clear breath sounds b/l  Cardiovascular system: S1/S2+. No rubs or clicks  Gastrointestinal system: Abd is soft, NT, obese & normal bowel sounds  Central nervous system: alert and oriented. Moves all extremities Extremities: left heel dressed & dressing is C/D/I. Moves all extremities  Psychiatry: Judgement and insight appears normal. Flat mood and affect    Data Reviewed: I have personally reviewed following labs and imaging studies  CBC: Recent Labs  Lab 03/23/21 0128 03/24/21 0558 03/25/21 0543  WBC 10.6* 6.8 5.0  NEUTROABS 8.8*  --   --   HGB 8.9* 8.5* 8.2*  HCT 28.3* 26.6* 25.7*  MCV 87.9 87.8 88.9  PLT 179 151 967   Basic Metabolic Panel: Recent Labs  Lab 03/23/21 0128 03/24/21 0558 03/25/21 0543  NA 131* 134* 136  K 5.2* 4.8 4.2  CL 103 106 109  CO2 22 23 23   GLUCOSE 102* 128* 91  BUN 45* 46* 37*  CREATININE 2.22* 2.23* 1.66*  CALCIUM 8.3* 8.6* 8.4*  MG 2.5*  --   --    GFR: Estimated Creatinine Clearance: 42.9  mL/min (A) (by C-G formula based on SCr of 1.66 mg/dL (H)). Liver Function Tests: Recent Labs  Lab 03/23/21 0128  AST 15  ALT 16  ALKPHOS 54  BILITOT 0.7  PROT 6.4*  ALBUMIN 3.6   No results for input(s): LIPASE, AMYLASE in the last 168 hours. No results for input(s): AMMONIA in the last 168 hours. Coagulation Profile: No results for input(s): INR, PROTIME in the last 168 hours. Cardiac Enzymes: No results for input(s): CKTOTAL, CKMB, CKMBINDEX, TROPONINI in the last 168 hours. BNP (last 3 results) No results for input(s): PROBNP in the last 8760 hours. HbA1C: No results for input(s): HGBA1C in the last 72  hours. CBG: Recent Labs  Lab 03/23/21 0649  GLUCAP 98   Lipid Profile: No results for input(s): CHOL, HDL, LDLCALC, TRIG, CHOLHDL, LDLDIRECT in the last 72 hours. Thyroid Function Tests: Recent Labs    03/23/21 0128  TSH 3.559   Anemia Panel: No results for input(s): VITAMINB12, FOLATE, FERRITIN, TIBC, IRON, RETICCTPCT in the last 72 hours. Sepsis Labs: No results for input(s): PROCALCITON, LATICACIDVEN in the last 168 hours.  Recent Results (from the past 240 hour(s))  Resp Panel by RT-PCR (Flu A&B, Covid) Nasopharyngeal Swab     Status: None   Collection Time: 03/23/21  1:28 AM   Specimen: Nasopharyngeal Swab; Nasopharyngeal(NP) swabs in vial transport medium  Result Value Ref Range Status   SARS Coronavirus 2 by RT PCR NEGATIVE NEGATIVE Final    Comment: (NOTE) SARS-CoV-2 target nucleic acids are NOT DETECTED.  The SARS-CoV-2 RNA is generally detectable in upper respiratory specimens during the acute phase of infection. The lowest concentration of SARS-CoV-2 viral copies this assay can detect is 138 copies/mL. A negative result does not preclude SARS-Cov-2 infection and should not be used as the sole basis for treatment or other patient management decisions. A negative result may occur with  improper specimen collection/handling, submission of specimen other than nasopharyngeal swab, presence of viral mutation(s) within the areas targeted by this assay, and inadequate number of viral copies(<138 copies/mL). A negative result must be combined with clinical observations, patient history, and epidemiological information. The expected result is Negative.  Fact Sheet for Patients:  EntrepreneurPulse.com.au  Fact Sheet for Healthcare Providers:  IncredibleEmployment.be  This test is no t yet approved or cleared by the [place] FDA and  has been authorized for detection and/or diagnosis of SARS-CoV-2 by FDA under an Emergency Use  Authorization (EUA). This EUA will remain  in effect (meaning this test can be used) for the duration of the COVID-19 declaration under Section 564(b)(1) of the Act, 21 U.S.C.section 360bbb-3(b)(1), unless the authorization is terminated  or revoked sooner.       Influenza A by PCR NEGATIVE NEGATIVE Final   Influenza B by PCR NEGATIVE NEGATIVE Final    Comment: (NOTE) The Xpert Xpress SARS-CoV-2/FLU/RSV plus assay is intended as an aid in the diagnosis of influenza from Nasopharyngeal swab specimens and should not be used as a sole basis for treatment. Nasal washings and aspirates are unacceptable for Xpert Xpress SARS-CoV-2/FLU/RSV testing.  Fact Sheet for Patients: EntrepreneurPulse.com.au  Fact Sheet for Healthcare Providers: IncredibleEmployment.be  This test is not yet approved or cleared by the [place] FDA and has been authorized for detection and/or diagnosis of SARS-CoV-2 by FDA under an Emergency Use Authorization (EUA). This EUA will remain in effect (meaning this test can be used) for the duration of the COVID-19 declaration under Section 564(b)(1) of the Act, 21 U.S.C.  section 360bbb-3(b)(1), unless the authorization is terminated or revoked.  Performed at [facility], [address] [place], [place] [address]   Urine Culture     Status: Abnormal (Preliminary result)   Collection Time: 03/23/21  5:41 AM   Specimen: Urine, Clean Catch  Result Value Ref Range Status   Specimen Description   Final    URINE, CLEAN CATCH Performed at [facility], [address] [place], [place] [address]    Special Requests   Final    NONE Performed at [facility], [place], [place] [address]    Culture (A)  Final    50,000 COLONIES/mL PSEUDOMONAS AERUGINOSA 30,000 COLONIES/mL PROTEUS MIRABILIS >=100,000 COLONIES/mL ENTEROCOCCUS FAECALIS SUSCEPTIBILITIES TO FOLLOW Performed at [facility], [place] [address], [place], [place] [address]    Report Status PENDING  Incomplete  Blood culture (routine x 2)     Status: None (Preliminary result)   Collection Time: 03/23/21 10:33 AM   Specimen: BLOOD  Result Value Ref Range Status   Specimen Description BLOOD BLOOD RIGHT HAND  Final   Special Requests   Final    BOTTLES DRAWN AEROBIC AND ANAEROBIC Blood Culture results may not be optimal due to an inadequate volume of blood received in culture bottles   Culture   Final    NO GROWTH 2 DAYS Performed at [facility], [address] [place], [place] [address]    Report Status PENDING  Incomplete  Blood culture (routine x 2)     Status: None (Preliminary result)   Collection Time: 03/23/21  8:14 PM   Specimen: BLOOD  Result Value Ref Range Status   Specimen Description BLOOD LEFT ANTECUBITAL  Final   Special Requests   Final    BOTTLES DRAWN AEROBIC AND ANAEROBIC Blood Culture adequate volume   Culture   Final    NO GROWTH 2 DAYS Performed at [facility], [address], [place], [place] [address]    Report Status PENDING  Incomplete         Radiology Studies: DG Ankle Complete Left  Result Date: 03/23/2021 CLINICAL DATA:  Ulceration, infection EXAM: LEFT ANKLE COMPLETE - 3+ VIEW COMPARISON:  None. FINDINGS: No acute bony abnormality. Specifically, no fracture, subluxation, or dislocation. No bone destruction to suggest osteomyelitis. IMPRESSION: No acute bony abnormality. Electronically Signed   By: Rolm Baptise M.D.   On: 03/23/2021 09:53   DG Chest Portable 1 View  Result Date: 03/23/2021 CLINICAL DATA:  Shortness of breath, generalized weakness EXAM: PORTABLE CHEST 1 VIEW COMPARISON:  01/26/2020 FINDINGS: Low lung volumes. Heart and mediastinal contours are within normal limits. No focal opacities or effusions. No acute bony abnormality. IMPRESSION: No active cardiopulmonary disease. Electronically Signed   By: Rolm Baptise M.D.   On:  03/23/2021 09:13        Scheduled Meds:  buPROPion  300 mg Oral Daily   Chlorhexidine Gluconate Cloth  6 each Topical Daily   DULoxetine  60 mg Oral BH-q7a   heparin  5,000 Units Subcutaneous Q8H   mirabegron ER  50 mg Oral Daily   QUEtiapine Fumarate  150 mg Oral QHS   traZODone  50 mg Oral QHS   vortioxetine HBr  5 mg Oral Daily   Continuous Infusions:  sodium chloride 75 mL/hr at 03/25/21 0733   cefTRIAXone (ROCEPHIN)  IV Stopped (03/24/21 0944)     LOS: 2 days    Time spent: 30 mins  Wyvonnia Dusky, MD [facility] Pager [phone]  If 7PM-7AM, please contact night-coverage 03/25/2021, 8:20 AM

## 2021-03-26 ENCOUNTER — Inpatient Hospital Stay: Payer: Medicare Other

## 2021-03-26 LAB — URINE CULTURE: Culture: 50000 — AB

## 2021-03-26 LAB — BASIC METABOLIC PANEL
Anion gap: 5 (ref 5–15)
BUN: 26 mg/dL — ABNORMAL HIGH (ref 8–23)
CO2: 23 mmol/L (ref 22–32)
Calcium: 8.4 mg/dL — ABNORMAL LOW (ref 8.9–10.3)
Chloride: 107 mmol/L (ref 98–111)
Creatinine, Ser: 1.31 mg/dL — ABNORMAL HIGH (ref 0.44–1.00)
GFR, Estimated: 44 mL/min — ABNORMAL LOW (ref >60)
Glucose, Bld: 85 mg/dL (ref 70–99)
Potassium: 4.1 mmol/L (ref 3.5–5.1)
Sodium: 135 mmol/L (ref 135–145)

## 2021-03-26 LAB — CBC
HCT: 26 % — ABNORMAL LOW (ref 36.0–46.0)
Hemoglobin: 8.2 g/dL — ABNORMAL LOW (ref 12.0–15.0)
MCH: 27.2 pg (ref 26.0–34.0)
MCHC: 31.5 g/dL (ref 30.0–36.0)
MCV: 86.4 fL (ref 80.0–100.0)
Platelets: 178 10*3/uL (ref 150–400)
RBC: 3.01 MIL/uL — ABNORMAL LOW (ref 3.87–5.11)
RDW: 14.5 % (ref 11.5–15.5)
WBC: 5.3 10*3/uL (ref 4.0–10.5)
nRBC: 0 % (ref 0.0–0.2)

## 2021-03-26 NOTE — Progress Notes (Signed)
PT Cancellation Note  Patient Details Name: [name] MRN: [id number] DOB: 08/30/1950   Cancelled Treatment:    Reason Eval/Treat Not Completed: Chart reviewed for attempted treatment session.  Patient declined participation due to pain in L foot, generally feeling unwell today.  L heel noted to be resting on bed surface; PRAFO boot not in room.  Patient repositioned to comfort in bed, L heel floated on towel roll and message sent to patient care team re: PRAFO.  Primary RN to communicate order to central supply for delivery to floor.  Will continue to follow as medically appropriate and patient agreeable.  Najir Roop H. Owens Shark, PT, DPT, NCS 03/26/21, 11:09 AM [phone]

## 2021-03-26 NOTE — Progress Notes (Signed)
PROGRESS NOTE    [name]  CXK:[id number] DOB: 19-Oct-1950 DOA: 03/23/2021 PCP: Kirk Ruths, MD   Assessment & Plan:   Principal Problem:   AKI (acute kidney injury) [facility]) Active Problems:   Cellulitis of left lower extremity   Unstageable left heel decubitus ulcer & left leg cellulitis: continue on IV rocephin. Blood cxs NGTD. S/p left heel decubitus ulcer was debrided at wound care clinic on 03/20/2021. Continue w/ wound care as per podiatry. ABI is normal. No further debridements needed at time as per podiatry. Will need f/u outpatient w/ podiatry   Hyperkalemia: resolved   AKI on CKDIIIa: Cr is trending down from day prior. Avoid nephrotoxic meds    Hyponatremia: resolved   HTN: hold home dose of amlodipine, lisinopril, torsemide as BP is low normal   COPD: w/o exacerbation. Continue on bronchodilators & encourage incentive spirometry   Neurogenic bladder: w/ chronic indwelling foley catheter. No symptoms of UTI but urine cx was done on admission and is growing enterococcus faecalis, proteus, & pseudomonas likely secondary to colonization vs containment vs chronic indwelling foley cathter. No fevers, normal WBC. Will continue to monitor   Generalized weakness: hx of MS. PT/OT recs SNF  Morbid obesity: BMI 50.4. Complicates overall care & prognosis    DVT prophylaxis: heparin  Code Status: full  Family Communication: called pt's husband yesterday but no answer so left a voicemail  Disposition Plan: likely d/c to SNF   Level of care: Med-Surg  Status is: Inpatient  Remains inpatient appropriate because: severity of illness    Consultants:  Podiatry   Procedures:   Antimicrobials: rocephin    Subjective: Pt c/o fatigue    Objective: Vitals:   03/25/21 0753 03/25/21 1624 03/25/21 2014 03/26/21 0637  BP: (!) 118/47 140/73 (!) 136/50 (!) 114/46  Pulse: 70 77 68 65  Resp: 20 18 16 20   Temp: 98.2 F (36.8 C) 99.1 F (37.3 C) 99.6 F (37.6 C)  99.1 F (37.3 C)  TempSrc: Oral Oral Oral Oral  SpO2: 98% 98% 95% 94%  Weight:      Height:        Intake/Output Summary (Last 24 hours) at 03/26/2021 0755 Last data filed at 03/26/2021 9702 Gross per 24 hour  Intake 435.72 ml  Output 3200 ml  Net -2764.28 ml   Filed Weights   03/23/21 0031 03/23/21 2100  Weight: 122 kg 133.4 kg    Examination:  General exam: Appears calm but uncomfortable   Respiratory system: clear breath sounds b/l  Cardiovascular system: S1 & S2+. No rubs or clicks  Gastrointestinal system: Abd is soft, NT, obese & hyperactive bowel sounds   Central nervous system: Alert an oriented. Moves all extremities  Extremities: left heel dressed & dressing is C/D/I. Moves all extremities   Psychiatry: judgement and insight appears normal. Flat mood and affect    Data Reviewed: I have personally reviewed following labs and imaging studies  CBC: Recent Labs  Lab 03/23/21 0128 03/24/21 0558 03/25/21 0543 03/26/21 0419  WBC 10.6* 6.8 5.0 5.3  NEUTROABS 8.8*  --   --   --   HGB 8.9* 8.5* 8.2* 8.2*  HCT 28.3* 26.6* 25.7* 26.0*  MCV 87.9 87.8 88.9 86.4  PLT 179 151 158 637   Basic Metabolic Panel: Recent Labs  Lab 03/23/21 0128 03/24/21 0558 03/25/21 0543 03/26/21 0419  NA 131* 134* 136 135  K 5.2* 4.8 4.2 4.1  CL 103 106 109 107  CO2 22 23  23 23  GLUCOSE 102* 128* 91 85  BUN 45* 46* 37* 26*  CREATININE 2.22* 2.23* 1.66* 1.31*  CALCIUM 8.3* 8.6* 8.4* 8.4*  MG 2.5*  --   --   --    GFR: Estimated Creatinine Clearance: 54.4 mL/min (A) (by C-G formula based on SCr of 1.31 mg/dL (H)). Liver Function Tests: Recent Labs  Lab 03/23/21 0128  AST 15  ALT 16  ALKPHOS 54  BILITOT 0.7  PROT 6.4*  ALBUMIN 3.6   No results for input(s): LIPASE, AMYLASE in the last 168 hours. No results for input(s): AMMONIA in the last 168 hours. Coagulation Profile: No results for input(s): INR, PROTIME in the last 168 hours. Cardiac Enzymes: No results for  input(s): CKTOTAL, CKMB, CKMBINDEX, TROPONINI in the last 168 hours. BNP (last 3 results) No results for input(s): PROBNP in the last 8760 hours. HbA1C: No results for input(s): HGBA1C in the last 72 hours. CBG: Recent Labs  Lab 03/23/21 0649  GLUCAP 98   Lipid Profile: No results for input(s): CHOL, HDL, LDLCALC, TRIG, CHOLHDL, LDLDIRECT in the last 72 hours. Thyroid Function Tests: No results for input(s): TSH, T4TOTAL, FREET4, T3FREE, THYROIDAB in the last 72 hours.  Anemia Panel: No results for input(s): VITAMINB12, FOLATE, FERRITIN, TIBC, IRON, RETICCTPCT in the last 72 hours. Sepsis Labs: Recent Labs  Lab 03/25/21 0543  PROCALCITON <0.10    Recent Results (from the past 240 hour(s))  Resp Panel by RT-PCR (Flu A&B, Covid) Nasopharyngeal Swab     Status: None   Collection Time: 03/23/21  1:28 AM   Specimen: Nasopharyngeal Swab; Nasopharyngeal(NP) swabs in vial transport medium  Result Value Ref Range Status   SARS Coronavirus 2 by RT PCR NEGATIVE NEGATIVE Final    Comment: (NOTE) SARS-CoV-2 target nucleic acids are NOT DETECTED.  The SARS-CoV-2 RNA is generally detectable in upper respiratory specimens during the acute phase of infection. The lowest concentration of SARS-CoV-2 viral copies this assay can detect is 138 copies/mL. A negative result does not preclude SARS-Cov-2 infection and should not be used as the sole basis for treatment or other patient management decisions. A negative result may occur with  improper specimen collection/handling, submission of specimen other than nasopharyngeal swab, presence of viral mutation(s) within the areas targeted by this assay, and inadequate number of viral copies(<138 copies/mL). A negative result must be combined with clinical observations, patient history, and epidemiological information. The expected result is Negative.  Fact Sheet for Patients:  EntrepreneurPulse.com.au  Fact Sheet for Healthcare  Providers:  IncredibleEmployment.be  This test is no t yet approved or cleared by the [place] FDA and  has been authorized for detection and/or diagnosis of SARS-CoV-2 by FDA under an Emergency Use Authorization (EUA). This EUA will remain  in effect (meaning this test can be used) for the duration of the COVID-19 declaration under Section 564(b)(1) of the Act, 21 U.S.C.section 360bbb-3(b)(1), unless the authorization is terminated  or revoked sooner.       Influenza A by PCR NEGATIVE NEGATIVE Final   Influenza B by PCR NEGATIVE NEGATIVE Final    Comment: (NOTE) The Xpert Xpress SARS-CoV-2/FLU/RSV plus assay is intended as an aid in the diagnosis of influenza from Nasopharyngeal swab specimens and should not be used as a sole basis for treatment. Nasal washings and aspirates are unacceptable for Xpert Xpress SARS-CoV-2/FLU/RSV testing.  Fact Sheet for Patients: EntrepreneurPulse.com.au  Fact Sheet for Healthcare Providers: IncredibleEmployment.be  This test is not yet approved or cleared by the  [place] Architectural technologist and has been authorized for detection and/or diagnosis of SARS-CoV-2 by FDA under an Print production planner (EUA). This EUA will remain in effect (meaning this test can be used) for the duration of the COVID-19 declaration under Section 564(b)(1) of the Act, 21 U.S.C. section 360bbb-3(b)(1), unless the authorization is terminated or revoked.  Performed at [facility], [facility], [address], [place], [place] [address]   Urine Culture     Status: Abnormal (Preliminary result)   Collection Time: 03/23/21  5:41 AM   Specimen: Urine, Clean Catch  Result Value Ref Range Status   Specimen Description   Final    URINE, CLEAN CATCH Performed at [facility], [address] [place], [place] [address]    Special Requests   Final    NONE Performed at [facility], [place], [place] [address]    Culture (A)  Final    50,000 COLONIES/mL PSEUDOMONAS AERUGINOSA 30,000 COLONIES/mL PROTEUS MIRABILIS >=100,000 COLONIES/mL ENTEROCOCCUS FAECALIS REPEATING SUSCEPTIBILITIES Performed at [facility], [place] [address], [place], [place] [address]    Report Status PENDING  Incomplete   Organism ID, Bacteria PSEUDOMONAS AERUGINOSA (A)  Final      Susceptibility   Pseudomonas aeruginosa - MIC*    CEFTAZIDIME 4 SENSITIVE Sensitive     CIPROFLOXACIN INTERMEDIATE Intermediate     GENTAMICIN <=1 SENSITIVE Sensitive     IMIPENEM 2 SENSITIVE Sensitive     * 50,000 COLONIES/mL PSEUDOMONAS AERUGINOSA  Blood culture (routine x 2)     Status: None (Preliminary result)   Collection Time: 03/23/21 10:33 AM   Specimen: BLOOD  Result Value Ref Range Status   Specimen Description BLOOD BLOOD RIGHT HAND  Final   Special Requests   Final    BOTTLES DRAWN AEROBIC AND ANAEROBIC Blood Culture results may not be optimal due to an inadequate volume of blood received in culture bottles   Culture   Final    NO GROWTH 2 DAYS Performed at [facility], [address] [place], [place] [address]    Report Status PENDING  Incomplete  Blood culture (routine x 2)     Status: None (Preliminary result)   Collection Time: 03/23/21  8:14 PM   Specimen: BLOOD  Result Value Ref Range Status   Specimen Description BLOOD LEFT ANTECUBITAL  Final   Special Requests   Final    BOTTLES DRAWN AEROBIC AND ANAEROBIC Blood Culture adequate volume   Culture   Final    NO GROWTH 2 DAYS Performed at [facility], [address] [place],  [address]    Report Status PENDING  Incomplete         Radiology Studies: No results found.      Scheduled Meds:  buPROPion  300 mg Oral Daily   Chlorhexidine Gluconate Cloth  6 each Topical Daily   DULoxetine  60 mg Oral BH-q7a   heparin  5,000 Units Subcutaneous Q8H   mirabegron ER  50 mg Oral Daily   QUEtiapine  Fumarate  150 mg Oral QHS   traZODone  50 mg Oral QHS   vortioxetine HBr  5 mg Oral Daily   Continuous Infusions:  sodium chloride 75 mL/hr at 03/26/21 0609   cefTRIAXone (ROCEPHIN)  IV Stopped (03/25/21 0935)     LOS: 3 days    Time spent: 25 mins     Wyvonnia Dusky, MD [facility] Pager [phone]  If 7PM-7AM, please contact  night-coverage 03/26/2021, 7:55 AM

## 2021-03-26 NOTE — TOC Initial Note (Addendum)
Transition of Care [facility]) - Initial/Assessment Note    Patient Details  Name: [name] MRN: [id number] Date of Birth: 1950-09-10  [facility]) CM/SW Contact:    Beverly Sessions, RN Phone Number: 03/26/2021, 4:21 PM  Clinical Narrative:                 Patient admitted form home with AKI Patient lives at home with husband PCP [place]  Patient states her husband provides transportation, and denies issues obtaining medications  Patient states she has a lift chair, WC, and RW in the home Therapy recommending snf Patient agreeable to bed search Existing PASRR Fl2 sent for signature Bed search initiated   Bedside RN notified of order for prevolon boot   Expected Discharge Plan: Skilled Nursing Facility Barriers to Discharge: Continued Medical Work up   Patient Goals and CMS Choice        Expected Discharge Plan and Services Expected Discharge Plan: [place]   Discharge Planning Services: CM Consult   Living arrangements for the past 2 months: Single Family Home                                      Prior Living Arrangements/Services Living arrangements for the past 2 months: Single Family Home Lives with:: Spouse Patient language and need for interpreter reviewed:: Yes Do you feel safe going back to the place where you live?: Yes      Need for Family Participation in Patient Care: Yes (Comment) Care giver support system in place?: Yes (comment) Current home services: DME Criminal Activity/Legal Involvement Pertinent to Current Situation/Hospitalization: No - Comment as needed  Activities of Daily Living Home Assistive Devices/Equipment: Walker (specify type) ADL Screening (condition at time of admission) Patient's cognitive ability adequate to safely complete daily activities?: Yes Is the patient deaf or have difficulty hearing?: No Does the patient have difficulty seeing, even when wearing  glasses/contacts?: No Does the patient have difficulty concentrating, remembering, or making decisions?: No Patient able to express need for assistance with ADLs?: Yes Does the patient have difficulty dressing or bathing?: Yes Independently performs ADLs?: No Communication: Independent Dressing (OT): Needs assistance Is this a change from baseline?: Pre-admission baseline Grooming: Needs assistance Is this a change from baseline?: Pre-admission baseline Feeding: Independent Bathing: Needs assistance Is this a change from baseline?: Pre-admission baseline Toileting: Needs assistance Is this a change from baseline?: Pre-admission baseline In/Out Bed: Needs assistance Is this a change from baseline?: Pre-admission baseline Walks in Home: Needs assistance Is this a change from baseline?: Pre-admission baseline Does the patient have difficulty walking or climbing stairs?: Yes Weakness of Legs: Both Weakness of Arms/Hands: Both  Permission Sought/Granted                  Emotional Assessment       Orientation: : Oriented to Self, Oriented to Place, Oriented to  Time, Oriented to Situation Alcohol / Substance Use: Not Applicable Psych Involvement: No (comment)  Admission diagnosis:  Lower urinary tract infectious disease [N39.0] Weakness [R53.1] AKI (acute kidney injury) ([place]) [N17.9] Cellulitis, unspecified cellulitis site [L03.90] Patient Active Problem List   Diagnosis Date Noted   Closed fracture of distal end of left femur ([place]) 02/04/2020   Femur fracture, left ([place]) 02/04/2020   Weakness of left leg 01/26/2020   Left leg weakness 01/26/2020   COPD (chronic obstructive pulmonary disease) (  [place]) 01/01/2020   Chronic indwelling Foley catheter 11/19/2019   Neuropathy    Hypokalemia    Hyperlipidemia    Sepsis secondary to UTI ([place]) 10/17/2019   Weakness    AKI (acute kidney injury) ([place]) 04/20/2019   Pressure injury of skin 02/03/2019   Bilateral lower leg  cellulitis 02/02/2019   Ovarian mass, left 01/27/2019   Sepsis ([place]) 01/05/2019   UTI (urinary tract infection) 06/13/2018   Altered mental status 06/11/2018   Fall 05/13/2018   Depression 05/13/2018   Recurrent cellulitis of lower extremity 06/25/2017   Medication monitoring encounter 06/05/2017   Cellulitis of left lower leg 04/25/2017   Adjustment disorder with mixed disturbance of emotions and conduct 04/23/2017   Foot pain, bilateral 02/03/2017   Tinea pedis 02/03/2017   Left ovarian cyst 12/09/2016   Abdominal aortic atherosclerosis ([place]) 11/11/2016   Cellulitis of left lower extremity 10/11/2016   Swelling of lower extremity 10/11/2016   Obstructive sleep apnea 33/00/7622   Follicular lymphoma of intra-abdominal lymph nodes ([place]) 08/06/2016   Multiple falls 06/07/2016   Inguinal adenopathy 05/29/2016   Obesity, morbid ([place]) 01/05/2016   Low HDL (under 40) 12/19/2015   Cellulitis 12/18/2015   Skin ulcer ([place]) 11/08/2015   Peripheral vascular disease of lower extremity with ulceration ([place]) 11/08/2015   CKD (chronic kidney disease) stage 3, GFR 30-59 ml/min (HCC) 11/08/2015   Constipation due to pain medication 01/31/2015   Obstructive sleep apnea of adult 01/13/2015   Pelvic muscle wasting 01/13/2015   Incomplete bladder emptying 01/13/2015   Headache, migraine 10/24/2014   Atonic neurogenic bladder 10/24/2014   Current tobacco use 10/24/2014   Lumbar radiculopathy, chronic 10/02/2013   COPD with bronchial hyperresponsiveness ([place]) 10/02/2013   Major depressive disorder, recurrent, in partial remission ([place]) 10/02/2013   Essential hypertension 10/02/2013   Multiple sclerosis ([place]) 10/02/2013   Absence of bladder continence 09/25/2012   Acontractile bladder 02/12/2012   Narrowing of intervertebral disc space 11/26/2011   Kyphoscoliosis and scoliosis 11/26/2011   PCP:  Kirk Ruths, MD Pharmacy:   [facility], [place] [address] [place] [address] Phone: [phone] Fax: [phone]  [facility], [place], [place] - [address] [place] [address] Phone: [phone] Fax: [phone]     Social Determinants of Health (SDOH) Interventions    Readmission Risk Interventions Readmission Risk Prevention Plan 03/26/2021 01/03/2020 04/22/2019  Transportation Screening Complete Complete Complete  PCP or Specialist Appt within 3-5 Days Complete - -  HRI or [place] for [place] Planning/Counseling Complete - -  Palliative Care Screening Complete - -  Medication Review ([place]) Complete Complete Referral to Pharmacy  PCP or Specialist appointment within 3-5 days of discharge - Complete Complete  HRI or [place] - Complete Complete  SW Recovery Care/Counseling Consult - Complete Complete  Palliative Care Screening - Not Applicable Not [place] - - Complete  Some recent data might be hidden

## 2021-03-26 NOTE — NC FL2 (Signed)
[place] LEVEL OF CARE SCREENING TOOL     IDENTIFICATION  Patient Name: [name] Birthdate: [birth year] Sex: female Admission Date (Current Location): 03/23/2021  [facility] and [place] Number:  Engineering geologist and Address:         Provider Number: [id number]  Attending Physician Name and Address:  Wyvonnia Dusky, MD  Relative Name and Phone Number:       Current Level of Care: Hospital Recommended Level of Care:   Prior Approval Number:    Date Approved/Denied:   PASRR Number: [id number]  Discharge Plan: SNF    Current Diagnoses: Patient Active Problem List   Diagnosis Date Noted   Closed fracture of distal end of left femur ([place]) 02/04/2020   Femur fracture, left ([place]) 02/04/2020   Weakness of left leg 01/26/2020   Left leg weakness 01/26/2020   COPD (chronic obstructive pulmonary disease) ([place]) 01/01/2020   Chronic indwelling Foley catheter 11/19/2019   Neuropathy    Hypokalemia    Hyperlipidemia    Sepsis secondary to UTI ([place]) 10/17/2019   Weakness    AKI (acute kidney injury) ([place]) 04/20/2019   Pressure injury of skin 02/03/2019   Bilateral lower leg cellulitis 02/02/2019   Ovarian mass, left 01/27/2019   Sepsis ([place]) 01/05/2019   UTI (urinary tract infection) 06/13/2018   Altered mental status 06/11/2018   Fall 05/13/2018   Depression 05/13/2018   Recurrent cellulitis of lower extremity 06/25/2017   Medication monitoring encounter 06/05/2017   Cellulitis of left lower leg 04/25/2017   Adjustment disorder with mixed disturbance of emotions and conduct 04/23/2017   Foot pain, bilateral 02/03/2017   Tinea pedis 02/03/2017   Left ovarian cyst 12/09/2016   Abdominal aortic atherosclerosis ([place]) 11/11/2016   Cellulitis of left lower extremity 10/11/2016   Swelling of lower extremity 10/11/2016   Obstructive sleep apnea 38/46/6599   Follicular lymphoma of intra-abdominal lymph nodes ([place]) 08/06/2016   Multiple falls  06/07/2016   Inguinal adenopathy 05/29/2016   Obesity, morbid ([place]) 01/05/2016   Low HDL (under 40) 12/19/2015   Cellulitis 12/18/2015   Skin ulcer ([place]) 11/08/2015   Peripheral vascular disease of lower extremity with ulceration ([place]) 11/08/2015   CKD (chronic kidney disease) stage 3, GFR 30-59 ml/min (HCC) 11/08/2015   Constipation due to pain medication 01/31/2015   Obstructive sleep apnea of adult 01/13/2015   Pelvic muscle wasting 01/13/2015   Incomplete bladder emptying 01/13/2015   Headache, migraine 10/24/2014   Atonic neurogenic bladder 10/24/2014   Current tobacco use 10/24/2014   Lumbar radiculopathy, chronic 10/02/2013   COPD with bronchial hyperresponsiveness ([place]) 10/02/2013   Major depressive disorder, recurrent, in partial remission ([place]) 10/02/2013   Essential hypertension 10/02/2013   Multiple sclerosis ([place]) 10/02/2013   Absence of bladder continence 09/25/2012   Acontractile bladder 02/12/2012   Narrowing of intervertebral disc space 11/26/2011   Kyphoscoliosis and scoliosis 11/26/2011    Orientation RESPIRATION BLADDER Height & Weight     Self, Situation, Time, Place  Normal Incontinent Weight: 133.4 kg Height:  5\' 4"  (162.6 cm)  BEHAVIORAL SYMPTOMS/MOOD NEUROLOGICAL BOWEL NUTRITION STATUS      Incontinent Diet (Heart Healthy)  AMBULATORY STATUS COMMUNICATION OF NEEDS Skin   Extensive Assist Verbally PU Stage and Appropriate Care Melburn Popper)                       Personal Care Assistance Level of Assistance  Functional Limitations Info             SPECIAL CARE FACTORS FREQUENCY  PT (By licensed PT), OT (By licensed OT)                    Contractures Contractures Info: Not present    Additional Factors Info  Code Status, Allergies, Isolation Precautions (hx VRE MRSA) Code Status Info: Full Allergies Info: NKDA     Isolation Precautions Info: hx VRE MRSA     Current Medications (03/26/2021):  This is the  current hospital active medication list Current Facility-Administered Medications  Medication Dose Route Frequency Provider Last Rate Last Admin   0.9 %  sodium chloride infusion   Intravenous Continuous Wyvonnia Dusky, MD 75 mL/hr at 03/26/21 0609 New Bag at 03/26/21 0609   acetaminophen (TYLENOL) tablet 650 mg  650 mg Oral Q6H PRN Jennye Boroughs, MD       Or   acetaminophen (TYLENOL) suppository 650 mg  650 mg Rectal Q6H PRN Jennye Boroughs, MD       buPROPion (WELLBUTRIN XL) 24 hr tablet 300 mg  300 mg Oral Daily Sharion Settler, NP   300 mg at 03/26/21 0919   cefTRIAXone (ROCEPHIN) 2 g in sodium chloride 0.9 % 100 mL IVPB  2 g Intravenous Daily Jennye Boroughs, MD   Stopped at 03/26/21 0955   Chlorhexidine Gluconate Cloth 2 % PADS 6 each  6 each Topical Daily Sharion Settler, NP   6 each at 03/26/21 1052   DULoxetine (CYMBALTA) DR capsule 60 mg  60 mg Oral Rex Kras, Hassan Rowan, NP   60 mg at 03/26/21 9622   heparin injection 5,000 Units  5,000 Units Subcutaneous Q8H Jennye Boroughs, MD   5,000 Units at 03/26/21 1342   HYDROmorphone (DILAUDID) injection 0.5 mg  0.5 mg Intravenous Q4H PRN Jennye Boroughs, MD   0.5 mg at 03/26/21 1342   mirabegron ER (MYRBETRIQ) tablet 50 mg  50 mg Oral Daily Sharion Settler, NP   50 mg at 03/26/21 0919   ondansetron (ZOFRAN) tablet 4 mg  4 mg Oral Q6H PRN Jennye Boroughs, MD       Or   ondansetron [facility]) injection 4 mg  4 mg Intravenous Q6H PRN Jennye Boroughs, MD   4 mg at 03/26/21 1329   oxyCODONE (Oxy IR/ROXICODONE) immediate release tablet 5 mg  5 mg Oral Q8H PRN Jennye Boroughs, MD   5 mg at 03/26/21 2979   polyethylene glycol (MIRALAX / GLYCOLAX) packet 17 g  17 g Oral Daily PRN Jennye Boroughs, MD       QUEtiapine (SEROQUEL XR) 24 hr tablet 150 mg  150 mg Oral QHS Sharion Settler, NP   150 mg at 03/25/21 2204   traZODone (DESYREL) tablet 50 mg  50 mg Oral QHS Sharion Settler, NP   50 mg at 03/25/21 2201   vortioxetine HBr (TRINTELLIX) tablet 5 mg   5 mg Oral Daily Sharion Settler, NP   5 mg at 03/26/21 8921   Facility-Administered Medications Ordered in Other Encounters  Medication Dose Route Frequency Provider Last Rate Last Admin   heparin lock flush 100 unit/mL  500 Units Intracatheter Once PRN Sindy Guadel[place], MD         Discharge Medications: Please see discharge summary for a list of discharge medications.  Relevant Imaging Results:  Relevant Lab Results:   Additional Information SS# [id number]  Beverly Sessions, RN

## 2021-03-27 LAB — BASIC METABOLIC PANEL
Anion gap: 4 — ABNORMAL LOW (ref 5–15)
BUN: 19 mg/dL (ref 8–23)
CO2: 23 mmol/L (ref 22–32)
Calcium: 8.5 mg/dL — ABNORMAL LOW (ref 8.9–10.3)
Chloride: 107 mmol/L (ref 98–111)
Creatinine, Ser: 1.18 mg/dL — ABNORMAL HIGH (ref 0.44–1.00)
GFR, Estimated: 50 mL/min — ABNORMAL LOW (ref >60)
Glucose, Bld: 99 mg/dL (ref 70–99)
Potassium: 4.4 mmol/L (ref 3.5–5.1)
Sodium: 134 mmol/L — ABNORMAL LOW (ref 135–145)

## 2021-03-27 LAB — CBC
HCT: 27.4 % — ABNORMAL LOW (ref 36.0–46.0)
Hemoglobin: 8.8 g/dL — ABNORMAL LOW (ref 12.0–15.0)
MCH: 27.8 pg (ref 26.0–34.0)
MCHC: 32.1 g/dL (ref 30.0–36.0)
MCV: 86.7 fL (ref 80.0–100.0)
Platelets: 180 10*3/uL (ref 150–400)
RBC: 3.16 MIL/uL — ABNORMAL LOW (ref 3.87–5.11)
RDW: 14.2 % (ref 11.5–15.5)
WBC: 5.1 10*3/uL (ref 4.0–10.5)
nRBC: 0 % (ref 0.0–0.2)

## 2021-03-27 MED ORDER — AMLODIPINE BESYLATE 5 MG PO TABS
ORAL_TABLET | ORAL | Status: AC
  Filled 2021-03-27: qty 1

## 2021-03-27 MED ORDER — HYDROCODONE-ACETAMINOPHEN 5-325 MG PO TABS
ORAL_TABLET | ORAL | Status: AC
  Filled 2021-03-27: qty 1

## 2021-03-27 MED ORDER — OXYCODONE HCL 5 MG PO TABS
ORAL_TABLET | ORAL | Status: AC
  Filled 2021-03-27: qty 1

## 2021-03-27 MED ORDER — AMLODIPINE BESYLATE 5 MG PO TABS
5.0000 mg | ORAL_TABLET | Freq: Every day | ORAL | Status: DC
  Administered 2021-03-27 – 2021-03-29 (×3): 5 mg via ORAL
  Filled 2021-03-27 (×2): qty 1

## 2021-03-27 NOTE — TOC Progression Note (Signed)
Transition of Care [facility]) - Progression Note    Patient Details  Name: [name] MRN: [id number] Date of Birth: 31-Oct-1950  Transition of Care [facility]) CM/SW Contact  Eileen Stanford, LCSW Phone Number: 03/27/2021, 2:37 PM  Clinical Narrative:   Josem Kaufmann started    Expected Discharge Plan: [place] Barriers to Discharge: Continued Medical Work up  Expected Discharge Plan and Services Expected Discharge Plan: [place]   Discharge Planning Services: CM Consult   Living arrangements for the past 2 months: Single Family Home                                       Social Determinants of Health (SDOH) Interventions    Readmission Risk Interventions Readmission Risk Prevention Plan 03/26/2021 01/03/2020 04/22/2019  Transportation Screening Complete Complete Complete  PCP or Specialist Appt within 3-5 Days Complete - -  HRI or [place] for [place] Planning/Counseling Complete - -  Palliative Care Screening Complete - -  Medication Review [employer] photographer) Complete Complete Referral to Pharmacy  PCP or Specialist appointment within 3-5 days of discharge - Complete Complete  HRI or [place] - Complete Complete  SW Recovery Care/Counseling Consult - Complete Complete  Palliative Care Screening - Not Applicable Not [place] - - Complete  Some recent data might be hidden

## 2021-03-27 NOTE — Progress Notes (Signed)
PROGRESS NOTE   HPI was taken from Dr. Mal Misty: [name] is a 70 y.o. female with medical history significant for neurogenic bladder with indwelling Foley catheter in place, CKD stage IIIa, ADHD, anxiety, COPD, B-cell follicular lymphoma, hypertension, hypotension, kyphoscoliosis, multiple sclerosis, left femur fracture requiring ORIF in October 2021, morbid obesity, PVD, and left heel decubitus ulcer that is managed at the wound care clinic.  She presented to the hospital because of generalized weakness and malaise.  She also complained of pain from the left heel decubitus ulcer with some drainage, painful swelling and redness of the left leg over the past few days.  She complains of cough with pleuritic chest pain and feeling a little short of breath.   She was recently seen at the wound care clinic on 03/20/2021 for debridement of left heel decubitus ulcer  No fever, chills, vomiting, diarrhea, abdominal pain, headache, dizziness, confusion, or any urinary symptoms.   Hospital course from Dr. Jimmye Norman 11/26-11/29/22: Pt was found to have unstageable left heel ulcer & LLE cellulitis. Pt is on IV rocephin. Podiatry evaluated pt and no need for any further debridements at this time and pt can f/u outpatient w/ podiatry. S/p left heel decubitus ulcer was debrided at wound care clinic on 03/20/2021. PT/OT recs SNF. CM is working on SNF placement still     JAQUITA BESSIRE  VOZ:[id number] DOB: 1950/10/31 DOA: 03/23/2021 PCP: Kirk Ruths, MD   Assessment & Plan:   Principal Problem:   AKI (acute kidney injury) [facility]) Active Problems:   Cellulitis of left lower extremity   Unstageable left heel decubitus ulcer & left leg cellulitis: continue on IV rocephin. Blood cxs NGTD. S/p left heel decubitus ulcer was debrided at wound care clinic on 03/20/2021. Continue w/ wound care as per podiatry. ABI is normal. No further debridements needed at time as per podiatry. Will need f/u outpatient w/  podiatry   Hyperkalemia: resolved   AKI on CKDIIIa: Cr continues to trend down daily.    Hyponatremia: resolved   HTN: will restart home dose of amlodipine. Can restart home dose of lisinopril & torsemide tomorrow if Cr is stable and/or improving    Likely ACD: secondary to CKD. H&H are labile. No need for a transfusion currently   COPD: w/o exacerbation. Continue on bronchodilators & encourage incentive spirometry   Neurogenic bladder: w/ chronic indwelling foley catheter. No symptoms of UTI but urine cx was done on admission and is growing enterococcus faecalis, proteus, & pseudomonas likely secondary to colonization vs containment vs chronic indwelling foley cathter. No fevers, normal WBC. Will continue to monitor   Generalized weakness: w/ hx of MS. PT/OT recs SNF. CM is working on SNF placement    Morbid obesity: BMI 50.4. Complicates overall care & prognosis    DVT prophylaxis: heparin  Code Status: full  Family Communication:  Disposition Plan: likely d/c to SNF   Level of care: Med-Surg  Status is: Inpatient  Remains inpatient appropriate because: medically stable but needs SNF placement. CM is working on this     Consultants:  Podiatry   Procedures:   Antimicrobials: rocephin    Subjective: Pt c/o fatigue    Objective: Vitals:   03/26/21 1613 03/26/21 2258 03/27/21 0600 03/27/21 0803  BP: (!) 120/51 135/70 133/62 (!) 165/75  Pulse: (!) 59 63 68 68  Resp: 16 18 18 18   Temp: 98.3 F (36.8 C) 98.6 F (37 C) 98.5 F (36.9 C) 98.3 F (36.8 C)  TempSrc:  Oral Oral Oral Oral  SpO2: 98% 98% 96% 96%  Weight:      Height:        Intake/Output Summary (Last 24 hours) at 03/27/2021 0842 Last data filed at 03/27/2021 0600 Gross per 24 hour  Intake 2368.06 ml  Output 2900 ml  Net -531.94 ml   Filed Weights   03/23/21 0031 03/23/21 2100  Weight: 122 kg 133.4 kg    Examination:  General exam: Appears calm but uncomfortable   Respiratory system:  clear breath sounds b/l  Cardiovascular system: S1/S2+. No rubs or clicks  Gastrointestinal system: Abd is soft, NT, obese & hyperactive bowel sounds  Central nervous system: Alert and oriented. Moves all extremities  Extremities: left heel dressed & dressing is C/D/I. Moves all extremities   Psychiatry: judgement and insight appear normal. Flat mood and affect     Data Reviewed: I have personally reviewed following labs and imaging studies  CBC: Recent Labs  Lab 03/23/21 0128 03/24/21 0558 03/25/21 0543 03/26/21 0419 03/27/21 0219  WBC 10.6* 6.8 5.0 5.3 5.1  NEUTROABS 8.8*  --   --   --   --   HGB 8.9* 8.5* 8.2* 8.2* 8.8*  HCT 28.3* 26.6* 25.7* 26.0* 27.4*  MCV 87.9 87.8 88.9 86.4 86.7  PLT 179 151 158 178 416   Basic Metabolic Panel: Recent Labs  Lab 03/23/21 0128 03/24/21 0558 03/25/21 0543 03/26/21 0419 03/27/21 0219  NA 131* 134* 136 135 134*  K 5.2* 4.8 4.2 4.1 4.4  CL 103 106 109 107 107  CO2 22 23 23 23 23   GLUCOSE 102* 128* 91 85 99  BUN 45* 46* 37* 26* 19  CREATININE 2.22* 2.23* 1.66* 1.31* 1.18*  CALCIUM 8.3* 8.6* 8.4* 8.4* 8.5*  MG 2.5*  --   --   --   --    GFR: Estimated Creatinine Clearance: 60.4 mL/min (A) (by C-G formula based on SCr of 1.18 mg/dL (H)). Liver Function Tests: Recent Labs  Lab 03/23/21 0128  AST 15  ALT 16  ALKPHOS 54  BILITOT 0.7  PROT 6.4*  ALBUMIN 3.6   No results for input(s): LIPASE, AMYLASE in the last 168 hours. No results for input(s): AMMONIA in the last 168 hours. Coagulation Profile: No results for input(s): INR, PROTIME in the last 168 hours. Cardiac Enzymes: No results for input(s): CKTOTAL, CKMB, CKMBINDEX, TROPONINI in the last 168 hours. BNP (last 3 results) No results for input(s): PROBNP in the last 8760 hours. HbA1C: No results for input(s): HGBA1C in the last 72 hours. CBG: Recent Labs  Lab 03/23/21 0649  GLUCAP 98   Lipid Profile: No results for input(s): CHOL, HDL, LDLCALC, TRIG, CHOLHDL,  LDLDIRECT in the last 72 hours. Thyroid Function Tests: No results for input(s): TSH, T4TOTAL, FREET4, T3FREE, THYROIDAB in the last 72 hours.  Anemia Panel: No results for input(s): VITAMINB12, FOLATE, FERRITIN, TIBC, IRON, RETICCTPCT in the last 72 hours. Sepsis Labs: Recent Labs  Lab 03/25/21 0543  PROCALCITON <0.10    Recent Results (from the past 240 hour(s))  Resp Panel by RT-PCR (Flu A&B, Covid) Nasopharyngeal Swab     Status: None   Collection Time: 03/23/21  1:28 AM   Specimen: Nasopharyngeal Swab; Nasopharyngeal(NP) swabs in vial transport medium  Result Value Ref Range Status   SARS Coronavirus 2 by RT PCR NEGATIVE NEGATIVE Final    Comment: (NOTE) SARS-CoV-2 target nucleic acids are NOT DETECTED.  The SARS-CoV-2 RNA is generally detectable in upper respiratory specimens during  the acute phase of infection. The lowest concentration of SARS-CoV-2 viral copies this assay can detect is 138 copies/mL. A negative result does not preclude SARS-Cov-2 infection and should not be used as the sole basis for treatment or other patient management decisions. A negative result may occur with  improper specimen collection/handling, submission of specimen other than nasopharyngeal swab, presence of viral mutation(s) within the areas targeted by this assay, and inadequate number of viral copies(<138 copies/mL). A negative result must be combined with clinical observations, patient history, and epidemiological information. The expected result is Negative.  Fact Sheet for Patients:  EntrepreneurPulse.com.au  Fact Sheet for Healthcare Providers:  IncredibleEmployment.be  This test is no t yet approved or cleared by the [place] FDA and  has been authorized for detection and/or diagnosis of SARS-CoV-2 by FDA under an Emergency Use Authorization (EUA). This EUA will remain  in effect (meaning this test can be used) for the duration of  the COVID-19 declaration under Section 564(b)(1) of the Act, 21 U.S.C.section 360bbb-3(b)(1), unless the authorization is terminated  or revoked sooner.       Influenza A by PCR NEGATIVE NEGATIVE Final   Influenza B by PCR NEGATIVE NEGATIVE Final    Comment: (NOTE) The Xpert Xpress SARS-CoV-2/FLU/RSV plus assay is intended as an aid in the diagnosis of influenza from Nasopharyngeal swab specimens and should not be used as a sole basis for treatment. Nasal washings and aspirates are unacceptable for Xpert Xpress SARS-CoV-2/FLU/RSV testing.  Fact Sheet for Patients: EntrepreneurPulse.com.au  Fact Sheet for Healthcare Providers: IncredibleEmployment.be  This test is not yet approved or cleared by the [place] FDA and has been authorized for detection and/or diagnosis of SARS-CoV-2 by FDA under an Emergency Use Authorization (EUA). This EUA will remain in effect (meaning this test can be used) for the duration of the COVID-19 declaration under Section 564(b)(1) of the Act, 21 U.S.C. section 360bbb-3(b)(1), unless the authorization is terminated or revoked.  Performed at [facility], [address] [place], [place] [address]   Urine Culture     Status: Abnormal   Collection Time: 03/23/21  5:41 AM   Specimen: Urine, Clean Catch  Result Value Ref Range Status   Specimen Description   Final    URINE, CLEAN CATCH Performed at [facility], [address] [place], [place] [address]    Special Requests   Final    NONE Performed at [facility], [place], [place] [address]    Culture (A)  Final    50,000 COLONIES/mL PSEUDOMONAS AERUGINOSA 30,000 COLONIES/mL PROTEUS MIRABILIS >=100,000 COLONIES/mL ENTEROCOCCUS FAECALIS VANCOMYCIN RESISTANT ENTEROCOCCUS    Report Status 03/26/2021 FINAL  Final   Organism ID, Bacteria PSEUDOMONAS AERUGINOSA (A)  Final   Organism ID, Bacteria ENTEROCOCCUS  FAECALIS (A)  Final      Susceptibility   Enterococcus faecalis - MIC*    AMPICILLIN <=2 SENSITIVE Sensitive     NITROFURANTOIN <=16 SENSITIVE Sensitive     VANCOMYCIN >=32 RESISTANT Resistant     LINEZOLID 2 SENSITIVE Sensitive     * >=100,000 COLONIES/mL ENTEROCOCCUS FAECALIS   Pseudomonas aeruginosa - MIC*    CEFTAZIDIME 4 SENSITIVE Sensitive     CIPROFLOXACIN INTERMEDIATE Intermediate     GENTAMICIN <=1 SENSITIVE Sensitive     IMIPENEM 2 SENSITIVE Sensitive     * 50,000 COLONIES/mL PSEUDOMONAS AERUGINOSA  Blood culture (routine x 2)     Status: None (Preliminary result)   Collection Time: 03/23/21 10:33 AM  Specimen: BLOOD  Result Value Ref Range Status   Specimen Description BLOOD BLOOD RIGHT HAND  Final   Special Requests   Final    BOTTLES DRAWN AEROBIC AND ANAEROBIC Blood Culture results may not be optimal due to an inadequate volume of blood received in culture bottles   Culture   Final    NO GROWTH 3 DAYS Performed at [name], [address], [place], [place] [address]    Report Status PENDING  Incomplete  Blood culture (routine x 2)     Status: None (Preliminary result)   Collection Time: 03/23/21  8:14 PM   Specimen: BLOOD  Result Value Ref Range Status   Specimen Description BLOOD LEFT ANTECUBITAL  Final   Special Requests   Final    BOTTLES DRAWN AEROBIC AND ANAEROBIC Blood Culture adequate volume   Culture   Final    NO GROWTH 3 DAYS Performed at [facility], [address], [place], [place] [address]    Report Status PENDING  Incomplete         Radiology Studies: US ARTERIAL ABI (SCREENING LOWER EXTREMITY)  Result Date: 03/26/2021 CLINICAL DATA:  Nonhealing left heel wound EXAM: NONINVASIVE PHYSIOLOGIC VASCULAR STUDY OF BILATERAL LOWER EXTREMITIES TECHNIQUE: Evaluation of both lower extremities were performed at rest, including calculation of ankle-brachial indices with single level Doppler, pressure and pulse volume  recording. COMPARISON:  None. FINDINGS: Right ABI:  1.20 Left ABI:  1.11 Right Lower Extremity:  Normal arterial waveforms at the ankle. Left Lower Extremity:  Normal arterial waveforms at the ankle. 1.0-1.4 Normal IMPRESSION: Normal ABI examination. Electronically Signed   By: Miachel Roux M.D.   On: 03/26/2021 08:24        Scheduled Meds:  buPROPion  300 mg Oral Daily   Chlorhexidine Gluconate Cloth  6 each Topical Daily   DULoxetine  60 mg Oral BH-q7a   heparin  5,000 Units Subcutaneous Q8H   mirabegron ER  50 mg Oral Daily   QUEtiapine Fumarate  150 mg Oral QHS   traZODone  50 mg Oral QHS   vortioxetine HBr  5 mg Oral Daily   Continuous Infusions:  sodium chloride 75 mL/hr at 03/27/21 0003   cefTRIAXone (ROCEPHIN)  IV Stopped (03/26/21 0955)     LOS: 4 days    Time spent: 25 mins     Wyvonnia Dusky, MD [facility] Pager [phone]  If 7PM-7AM, please contact night-coverage 03/27/2021, 8:42 AM

## 2021-03-27 NOTE — TOC Progression Note (Signed)
Transition of Care [facility]) - Progression Note    Patient Details  Name: [name] MRN: [id number] Date of Birth: January 19, 1951  Transition of Care [facility]) CM/SW Contact  Eileen Stanford, LCSW Phone Number: 03/27/2021, 1:42 PM  Clinical Narrative: Pt agreeable to [facility], will start auth as soon as a updated PT note is in.      Expected Discharge Plan: Skilled Nursing Facility Barriers to Discharge: Continued Medical Work up  Expected Discharge Plan and Services Expected Discharge Plan: [place]   Discharge Planning Services: CM Consult   Living arrangements for the past 2 months: Single Family Home                                       Social Determinants of Health (SDOH) Interventions    Readmission Risk Interventions Readmission Risk Prevention Plan 03/26/2021 01/03/2020 04/22/2019  Transportation Screening Complete Complete Complete  PCP or Specialist Appt within 3-5 Days Complete - -  HRI or [place] for [place] Planning/Counseling Complete - -  Palliative Care Screening Complete - -  Medication Review Press photographer) Complete Complete Referral to Pharmacy  PCP or Specialist appointment within 3-5 days of discharge - Complete Complete  HRI or [place] - Complete Complete  SW Recovery Care/Counseling Consult - Complete Complete  Palliative Care Screening - Not Applicable Not [place] - - Complete  Some recent data might be hidden

## 2021-03-27 NOTE — Progress Notes (Signed)
Physical Therapy Treatment Patient Details Name: [name] MRN: [id number] DOB: [birth year] Today's Date: 03/27/2021   History of Present Illness [name] is a 70 y.o. female with medical history significant for neurogenic bladder with indwelling Foley catheter in place, CKD stage IIIa, ADHD, anxiety, COPD, B-cell follicular lymphoma, hypertension, hypotension, kyphoscoliosis, multiple sclerosis, left femur fracture requiring ORIF in October 2021, morbid obesity, PVD, and left heel decubitus ulcer that is managed at the wound care clinic. BIB by EMS for genealized weakness and being tx'ed for AKI.    PT Comments    Patient is agreeable to PT. She continues to need assistance with bed mobility. She is fearful of falling and has posterior lean in sitting position. Facilitation and cues for upright posture.  Limited activity tolerance overall and fatigue with minimal activity. Patient is unsafe to return home at this time and would benefit from continued PT to maximize independence and facilitate return to prior level of function. SNF is recommended at discharge.    Recommendations for follow up therapy are one component of a multi-disciplinary discharge planning process, led by the attending physician.  Recommendations may be updated based on patient status, additional functional criteria and insurance authorization.  Follow Up Recommendations  Skilled nursing-short term rehab (<3 hours/day)     Assistance Recommended at Discharge Frequent or constant Supervision/Assistance  Equipment Recommendations  None recommended by PT    Recommendations for Other Services       Precautions / Restrictions Precautions Precautions: Fall Restrictions Weight Bearing Restrictions: No     Mobility  Bed Mobility Overal bed mobility: Needs Assistance Bed Mobility: Supine to Sit;Sit to Supine     Supine to sit: Max assist Sit to supine: Max assist   General bed mobility comments: assistance  for BLE and trunk support provided. verbal cues for sequencing and task initiation. increased time and effort provided    Transfers                   General transfer comment: unable to attempt due to poor sitting tolerance, fatigue with minimal activity. patient is fearful of falling also.    Ambulation/Gait                   Stairs             Wheelchair Mobility    Modified Rankin (Stroke Patients Only)       Balance Overall balance assessment: Needs assistance Sitting-balance support: Bilateral upper extremity supported Sitting balance-Leahy Scale: Poor Sitting balance - Comments: posterior lean in sitting position. patient relying heavily on UE support to maintain sitting balance Postural control: Posterior lean                                  Cognition Arousal/Alertness: Awake/alert Behavior During Therapy: WFL for tasks assessed/performed Overall Cognitive Status: Within Functional Limits for tasks assessed                                 General Comments: patient able to follow commands with increased time        Exercises      General Comments General comments (skin integrity, edema, etc.): patient reports mild nausea with mobility. encouraged patient to move in bed as able for skin integrity. PRAFO in place LLE at end of session  Pertinent Vitals/Pain Pain Assessment: No/denies pain    Home Living                          Prior Function            PT Goals (current goals can now be found in the care plan section) Acute Rehab PT Goals Patient Stated Goal: to go home PT Goal Formulation: With patient Time For Goal Achievement: 04/06/21 Potential to Achieve Goals: Fair Progress towards PT goals: Progressing toward goals    Frequency    Min 2X/week      PT Plan Current plan remains appropriate    Co-evaluation              AM-PAC PT "6 Clicks" Mobility   Outcome  Measure  Help needed turning from your back to your side while in a flat bed without using bedrails?: A Lot Help needed moving from lying on your back to sitting on the side of a flat bed without using bedrails?: A Lot Help needed moving to and from a bed to a chair (including a wheelchair)?: A Lot Help needed standing up from a chair using your arms (e.g., wheelchair or bedside chair)?: A Lot Help needed to walk in hospital room?: Total Help needed climbing 3-5 steps with a railing? : Total 6 Click Score: 10    End of Session   Activity Tolerance: Patient limited by fatigue Patient left: in bed;with call bell/phone within reach;with bed alarm set   PT Visit Diagnosis: Unsteadiness on feet (R26.81);Other abnormalities of gait and mobility (R26.89);Muscle weakness (generalized) (M62.81);Difficulty in walking, not elsewhere classified (R26.2)     Time: 4765-4650 PT Time Calculation (min) (ACUTE ONLY): 23 min  Charges:  $Therapeutic Activity: 23-37 mins                     Minna Merritts, PT, MPT    Percell Locus 03/27/2021, 2:26 PM

## 2021-03-28 LAB — CULTURE, BLOOD (ROUTINE X 2)
Culture: NO GROWTH
Culture: NO GROWTH
Special Requests: ADEQUATE

## 2021-03-28 LAB — BASIC METABOLIC PANEL
Anion gap: 4 — ABNORMAL LOW (ref 5–15)
BUN: 15 mg/dL (ref 8–23)
CO2: 24 mmol/L (ref 22–32)
Calcium: 8.9 mg/dL (ref 8.9–10.3)
Chloride: 107 mmol/L (ref 98–111)
Creatinine, Ser: 1.03 mg/dL — ABNORMAL HIGH (ref 0.44–1.00)
GFR, Estimated: 58 mL/min — ABNORMAL LOW (ref >60)
Glucose, Bld: 86 mg/dL (ref 70–99)
Potassium: 3.8 mmol/L (ref 3.5–5.1)
Sodium: 135 mmol/L (ref 135–145)

## 2021-03-28 LAB — CBC
HCT: 27.7 % — ABNORMAL LOW (ref 36.0–46.0)
Hemoglobin: 8.9 g/dL — ABNORMAL LOW (ref 12.0–15.0)
MCH: 27.6 pg (ref 26.0–34.0)
MCHC: 32.1 g/dL (ref 30.0–36.0)
MCV: 85.8 fL (ref 80.0–100.0)
Platelets: 192 10*3/uL (ref 150–400)
RBC: 3.23 MIL/uL — ABNORMAL LOW (ref 3.87–5.11)
RDW: 14.2 % (ref 11.5–15.5)
WBC: 5.5 10*3/uL (ref 4.0–10.5)
nRBC: 0 % (ref 0.0–0.2)

## 2021-03-28 NOTE — Progress Notes (Signed)
PROGRESS NOTE  [name]  DOB: [birth year]  PCP: Kirk Ruths, MD JWJ:[id number]  DOA: 03/23/2021  LOS: 5 days  Hospital Day: 6  Chief Complaint  Patient presents with   Weakness    EMS reports they were called out due to generalized weakness today.     Brief narrative: [name] is a 70 y.o. female with PMH significant for morbid obesity, COPD, HTN, CKD stage III, multiple sclerosis, neurogenic bladder with indwelling Foley catheter in place, B-cell follicular lymphoma, ADHD, anxiety, PVD, and left heel decubitus ulcer was living at home with her husband. She was brought to the ED on 11/25 for generalized weakness, malaise, left heel decubitus ulcer with some drainage, painful swelling and redness of the left leg. Patient was admitted for left leg cellulitis and heel ulcer. Started on IV Rocephin.  Podiatry consulted.  Prior to admission, patient had recent debridement done at the wound care clinic on 11/22.  She did not require any further debridement in the hospital. PT eval was obtained.  SNF recommended.  Currently pending placement.  Subjective: Patient was seen and examined this morning.  Pleasant elderly Caucasian female.  Not in distress.  No new symptoms.  Assessment/Plan: Left leg cellulitis and unstageable left heel decubitus ulcer -Currently on IV Rocephin.  Blood culture did not show any growth. S/p left heel decubitus ulcer was debrided at wound care clinic on 03/20/2021. -Continue w/ wound care as per podiatry. ABI is normal. No further debridements needed at time as per podiatry. Will need f/u outpatient w/ podiatry   AKI on CKD 3a -Creatinine improved to normal Recent Labs    03/23/21 0128 03/24/21 0558 03/25/21 0543 03/26/21 0419 03/27/21 0219 03/28/21 0557  BUN 45* 46* 37* 26* 19 15  CREATININE 2.22* 2.23* 1.66* 1.31* 1.18* 1.03*   Essential hypertension -Continue amlodipine.  Lisinopril and torsemide on hold.  Continue to monitor blood  pressure.  Resume as needed.  Anemia chronic disease -Hemoglobin stable between 8 and 9 for last 5 days. Recent Labs    03/24/21 0558 03/25/21 0543 03/26/21 0419 03/27/21 0219 03/28/21 0557  HGB 8.5* 8.2* 8.2* 8.8* 8.9*  MCV 87.8 88.9 86.4 86.7 85.8   COPD -Continue bronchodilators & encourage incentive spirometry   Multiple sclerosis  Neurogenic bladder: w/ chronic indwelling foley catheter -No symptoms of UTI but urine culture obtained on admission grew enterococcus faecalis, proteus, & pseudomonas likely secondary to colonization vs containment vs chronic indwelling foley cathter.  -No fevers, normal WBC. Will continue to monitor.   Generalized weakness -w/ hx of MS. PT/OT recs SNF. CM is working on SNF placement    Morbid obesity  -Body mass index is 50.46 kg/m. Patient has been advised to make an attempt to improve diet and exercise patterns to aid in weight loss.   Mobility: Mostly wheelchair dependent but uses walker as well Living condition: Lives at home with husband Goals of care:   Code Status: Full Code  Nutritional status: Body mass index is 50.46 kg/m.     Diet:  Diet Order             Diet Heart Room service appropriate? Yes; Fluid consistency: Thin  Diet effective now                  DVT prophylaxis:  heparin injection 5,000 Units Start: 03/23/21 2200   Antimicrobials: IV Rocephin Fluid: None Consultants: None at this time Family Communication: None at bedside  Status is: Inpatient  Remains inpatient appropriate because: Pending placement  Dispo: The patient is from: Home              Anticipated d/c is to: SNF              Patient currently is not medically stable to d/c.   Difficult to place patient No     Infusions:   cefTRIAXone (ROCEPHIN)  IV 2 g (03/28/21 0859)    Scheduled Meds:  amLODipine  5 mg Oral Daily   buPROPion  300 mg Oral Daily   Chlorhexidine Gluconate Cloth  6 each Topical Daily   DULoxetine  60 mg Oral  BH-q7a   heparin  5,000 Units Subcutaneous Q8H   mirabegron ER  50 mg Oral Daily   QUEtiapine Fumarate  150 mg Oral QHS   traZODone  50 mg Oral QHS   vortioxetine HBr  5 mg Oral Daily    PRN meds: acetaminophen **OR** acetaminophen, HYDROmorphone (DILAUDID) injection, ondansetron **OR** ondansetron (ZOFRAN) IV, oxyCODONE, polyethylene glycol   Antimicrobials: Anti-infectives (From admission, onward)    Start     Dose/Rate Route Frequency Ordered Stop   03/24/21 1000  cefTRIAXone (ROCEPHIN) 2 g in sodium chloride 0.9 % 100 mL IVPB        2 g 200 mL/hr over 30 Minutes Intravenous Daily 03/23/21 2016 03/31/21 0959   03/23/21 0845  cefTRIAXone (ROCEPHIN) 1 g in sodium chloride 0.9 % 100 mL IVPB        1 g 200 mL/hr over 30 Minutes Intravenous  Once 03/23/21 0832 03/23/21 1140       Objective: Vitals:   03/28/21 0303 03/28/21 0816  BP: (!) 135/52 (!) 141/67  Pulse: 63 65  Resp: 16 18  Temp: 98.6 F (37 C) 98.6 F (37 C)  SpO2: 95% 98%    Intake/Output Summary (Last 24 hours) at 03/28/2021 1516 Last data filed at 03/28/2021 0000 Gross per 24 hour  Intake 917.9 ml  Output --  Net 917.9 ml   Filed Weights   03/23/21 0031 03/23/21 2100  Weight: 122 kg 133.4 kg   Weight change:  Body mass index is 50.46 kg/m.   Physical Exam: General exam: Pleasant, elderly Caucasian..  Not in distress Skin: No rashes, lesions or ulcers. HEENT: Atraumatic, normocephalic, no obvious bleeding Lungs: Clear to auscultation bilaterally CVS: Regular rate and rhythm, no murmur GI/Abd soft, nontender, nondistended, bowel sound present CNS: Alert, awake, oriented x3 Psychiatry: Sad affect Extremities: No pedal edema, no calf tenderness  Data Review: I have personally reviewed the laboratory data and studies available.  F/u labs ordered Unresulted Labs (From admission, onward)    None       Signed, Terrilee Croak, MD [facility] 03/28/2021

## 2021-03-28 NOTE — TOC Progression Note (Signed)
Transition of Care [facility]) - Progression Note    Patient Details  Name: [name] MRN: [id number] Date of Birth: 1950/08/26  Transition of [facility]) CM/SW Contact  Eileen Stanford, LCSW Phone Number: 03/28/2021, 10:01 AM  Clinical Narrative:  clincials sent to [facility]     Expected Discharge Plan: [place] Barriers to Discharge: Continued Medical Work up  Expected Discharge Plan and Services Expected Discharge Plan: [place]   Discharge Planning Services: CM Consult   Living arrangements for the past 2 months: Single Family Home                                       Social Determinants of Health (SDOH) Interventions    Readmission Risk Interventions Readmission Risk Prevention Plan 03/26/2021 01/03/2020 04/22/2019  Transportation Screening Complete Complete Complete  PCP or Specialist Appt within 3-5 Days Complete - -  HRI or [place] for [place] Planning/Counseling Complete - -  Palliative Care Screening Complete - -  Medication Review Press photographer) Complete Complete Referral to Pharmacy  PCP or Specialist appointment within 3-5 days of discharge - Complete Complete  HRI or [place] - Complete Complete  SW Recovery Care/Counseling Consult - Complete Complete  Palliative Care Screening - Not Applicable Not [place] - - Complete  Some recent data might be hidden

## 2021-03-28 NOTE — Progress Notes (Signed)
Occupational Therapy Treatment Patient Details Name: [name] MRN: [id number] DOB: 1950/12/18 Today's Date: 03/28/2021   History of present illness [name] is a 70 y.o. female with medical history significant for neurogenic bladder with indwelling Foley catheter in place, CKD stage IIIa, ADHD, anxiety, COPD, B-cell follicular lymphoma, hypertension, hypotension, kyphoscoliosis, multiple sclerosis, left femur fracture requiring ORIF in October 2021, morbid obesity, PVD, and left heel decubitus ulcer that is managed at the wound care clinic. BIB by EMS for genealized weakness and being tx'ed for AKI.   OT comments  Pt seen for OT treatment on this date. Upon arrival to room, pt awake and sitting upright in bed. Pt agreeable to OT tx, however appears to be self-limiting at times - frequently stating that she is unable to do a task before attempting and requiring significant encouragement to attempt on own. Pt also reporting significant fear of falling. Pt currently requires SET-UP assist for bed-level grooming tasks, MAX A for bed-level LB dressing, MAX A for bed mobility, and MIN GUARD for static sitting balance at EOB d/t decreased strength, balance, and activity tolerance. Pt attempted x2 sit>stand transfers this date, however was unable to clear hips from bed following MAX A of 1-person. Pt also reporting nausea following transfer attempts. Following sit>supine transfer, pt was noted to be incontinent of stool. Pt required MAX A+2 for bed-level peri-care d/t pt reporting being unable to hold self with bedrails to maintain side lying position while receiving MAX A for peri-care. Pt continues to benefit from skilled OT services to maximize return to PLOF and minimize risk of future falls, injury, caregiver burden, and readmission. Will continue to follow POC. Discharge recommendation remains appropriate.     Recommendations for follow up therapy are one component of a multi-disciplinary discharge  planning process, led by the attending physician.  Recommendations may be updated based on patient status, additional functional criteria and insurance authorization.    Follow Up Recommendations  Skilled nursing-short term rehab (<3 hours/day)    Assistance Recommended at Discharge Frequent or constant Supervision/Assistance  Equipment Recommendations  BSC/3in1;Tub/shower seat;Other (comment) (2ww)       Precautions / Restrictions Precautions Precautions: Fall Restrictions Weight Bearing Restrictions: No       Mobility Bed Mobility Overal bed mobility: Needs Assistance Bed Mobility: Supine to Sit;Sit to Supine     Supine to sit: Max assist Sit to supine: Max assist   General bed mobility comments: Requires assistance for BLE and trunk support and verbal cues for sequencing and task initiation. With bed in Trendelenburg position, able to use UE to pull self toward [facility]    Transfers Overall transfer level: Needs assistance Equipment used: Rolling walker (2 wheels) Transfers: Sit to/from Stand Sit to Stand: Max assist           General transfer comment: x2 attempts, with pt unable to clear hips from bed following MAX A of 1-person     Balance Overall balance assessment: Needs assistance Sitting-balance support: Bilateral upper extremity supported Sitting balance-Leahy Scale: Poor Sitting balance - Comments: posterior lean in sitting position. patient relying heavily on UE support to maintain sitting balance Postural control: Posterior lean   Standing balance-Leahy Scale: Zero                             ADL either performed or assessed with clinical judgement   ADL Overall ADL's : Needs assistance/impaired     Grooming: Wash/dry  face;Supervision/safety;Set up;Bed level               Lower Body Dressing: Maximal assistance;Bed level Lower Body Dressing Details (indicate cue type and reason): to don/doff socks and L prevalon boot      Toileting- Clothing Manipulation and Hygiene: Maximal assistance;+2 for physical assistance;Bed level              Extremity/Trunk Assessment Upper Extremity Assessment Upper Extremity Assessment: Generalized weakness   Lower Extremity Assessment Lower Extremity Assessment: Generalized weakness         Cognition Arousal/Alertness: Awake/alert Behavior During Therapy: Anxious Overall Cognitive Status: No family/caregiver present to determine baseline cognitive functioning                                 General Comments: Pt agreeable to OT tx, however appears to be self-limiting at times - frequently stating that she is unable to do a task before attempting and requiring significant encouragement to attempt on own. Pt also reporting significant fear of falling                General Comments patient reports nausea with mobility. BP obtained and consistent with most prior BP reading. RN informed of pt request medication for nausea    Pertinent Vitals/ Pain       Pain Assessment: 0-10 Pain Score: 2  Pain Location: L heel Pain Descriptors / Indicators: Tender Pain Intervention(s): Limited activity within patient's tolerance;Monitored during session;Other (comment) (prevalon boot donned)         Frequency  Min 2X/week        Progress Toward Goals  OT Goals(current goals can now be found in the care plan section)  Progress towards OT goals: Progressing toward goals  Acute Rehab OT Goals Patient Stated Goal: to go home OT Goal Formulation: With patient Time For Goal Achievement: 04/07/21 Potential to Achieve Goals: [place] Discharge plan remains appropriate;Frequency remains appropriate       AM-PAC OT "6 Clicks" Daily Activity     Outcome Measure   Help from another person eating meals?: None Help from another person taking care of personal grooming?: A Little Help from another person toileting, which includes using toliet, bedpan, or  urinal?: A Lot Help from another person bathing (including washing, rinsing, drying)?: A Lot Help from another person to put on and taking off regular upper body clothing?: A Little Help from another person to put on and taking off regular lower body clothing?: A Lot 6 Click Score: 16    End of Session Equipment Utilized During Treatment: Gait belt;Rolling walker (2 wheels)  OT Visit Diagnosis: Unsteadiness on feet (R26.81);Muscle weakness (generalized) (M62.81);Pain Pain - Right/Left: Left Pain - part of body:  (heel)   Activity Tolerance Patient tolerated treatment well   Patient Left in bed;with call bell/phone within reach;with bed alarm set   Nurse Communication Mobility status;Other (comment) (pt requestion medication for nausea)        Time: 3532-9924 OT Time Calculation (min): 38 min  Charges: OT General Charges $OT Visit: 1 Visit OT Treatments $Self Care/Home Management : 38-52 mins  Fredirick Maudlin, OTR/L [place]

## 2021-03-29 MED ORDER — SACCHAROMYCES BOULARDII 250 MG PO CAPS: 250.0000 mg | ORAL_CAPSULE | Freq: Two times a day (BID) | ORAL | Status: DC

## 2021-03-29 MED ORDER — LISINOPRIL 20 MG PO TABS: 10.0000 mg | ORAL_TABLET | Freq: Every day | ORAL | 0 refills | Status: DC

## 2021-03-29 MED ORDER — POLYETHYLENE GLYCOL 3350 17 G PO PACK
17.0000 g | PACK | Freq: Every day | ORAL | 0 refills | Status: DC | PRN
Start: 2021-03-29 — End: 2021-12-22

## 2021-03-29 MED ORDER — CEFDINIR 300 MG PO CAPS: 300.0000 mg | ORAL_CAPSULE | Freq: Two times a day (BID) | ORAL | Status: AC

## 2021-03-29 MED ORDER — CEFDINIR 300 MG PO CAPS: 300.0000 mg | ORAL_CAPSULE | Freq: Two times a day (BID) | ORAL | Status: DC

## 2021-03-29 MED ORDER — OXYCODONE HCL 5 MG PO TABS: 5.0000 mg | ORAL_TABLET | Freq: Three times a day (TID) | ORAL | 0 refills | Status: AC | PRN

## 2021-03-29 NOTE — Progress Notes (Signed)
Occupational Therapy Treatment Patient Details Name: [name] MRN: [id number] DOB: [birth year] Today's Date: 03/29/2021   History of present illness [name] is a 70 y.o. female with medical history significant for neurogenic bladder with indwelling Foley catheter in place, CKD stage IIIa, ADHD, anxiety, COPD, B-cell follicular lymphoma, hypertension, hypotension, kyphoscoliosis, multiple sclerosis, left femur fracture requiring ORIF in October 2021, morbid obesity, PVD, and left heel decubitus ulcer that is managed at the wound care clinic. BIB by EMS for genealized weakness and being tx'ed for AKI.   OT comments  Pt seen for OT treatment this date to f/u re: safety with ADLs/ADL mobility. Pt somewhat drowsy and apprehensive about therapy participation, but agreeable. She requires increased processing time and MAX A +2 to come to EOB sitting for safety. She is noted to have posterior lean requiring assist to balance initially, but with cueing to lean FWD and support with B UE, she is able to achieve F static balance. She requires MAX A +2 for safety to CTS from only slightly elevated EOB surface (~4"). She stands ~4x for various aspects of self care including peri care and LB bathing as she had large loose stool upon coming to EOB sitting. She requires MAX A for all LB ADLs as she has POOR dynamic standing balance. She tolerates ~3 mins per stand. With Bari RW and MIN A +2, she is able to take ~7 small steps from bed to chair with cues to sequence throughout (including cues to reach back and control descent to sitting). She is left in chair with all needs met and in reach. RN present much of session and aware of pt status. Will continue to follow. Continue to anticipate that pt will require extensive rehabilitation efforts to safely return home. Recommend she f/u with OT services in STR setting.   Recommendations for follow up therapy are one component of a multi-disciplinary discharge planning  process, led by the attending physician.  Recommendations may be updated based on patient status, additional functional criteria and insurance authorization.    Follow Up Recommendations  Skilled nursing-short term rehab (<3 hours/day)    Assistance Recommended at Discharge Frequent or constant Supervision/Assistance  Equipment Recommendations  BSC/3in1;Tub/shower seat;Other (comment) (bari 2ww)    Recommendations for Other Services      Precautions / Restrictions Precautions Precautions: Fall Restrictions Weight Bearing Restrictions: No       Mobility Bed Mobility Overal bed mobility: Needs Assistance Bed Mobility: Supine to Sit     Supine to sit: Max assist;+2 for safety/equipment;HOB elevated     General bed mobility comments: Increased time to perform with Vcs throughout. +2 for safety    Transfers Overall transfer level: Needs assistance Equipment used: Rolling walker (2 wheels) Transfers: Sit to/from Stand Sit to Stand: +2 safety/equipment;Max assist;From elevated surface           General transfer comment: STS x4 for bathing/peri care and transfer to recliner. Large loose BM in EOB sitting. Tolerates ~48mns per stand to participate in self care     Balance Overall balance assessment: Needs assistance Sitting-balance support: Bilateral upper extremity supported Sitting balance-Leahy Scale: Fair Sitting balance - Comments: initially posterior leaning d/t fearful of falling, able to improve to F static sitting with cueing and B UE support Postural control: Posterior lean Standing balance support: Bilateral upper extremity supported Standing balance-Leahy Scale: Poor Standing balance comment: F static standing, P with fxl mobility d/t LE weakness/trmulous with steps  ADL either performed or assessed with clinical judgement   ADL Overall ADL's : Needs assistance/impaired     Grooming: Wash/dry face;Oral care;Set  up;Sitting   Upper Body Bathing: Minimal assistance;Sitting   Lower Body Bathing: Maximal assistance;Total assistance;+2 for physical assistance;+2 for safety/equipment;Sit to/from stand   Upper Body Dressing : Set up;Sitting   Lower Body Dressing: Maximal assistance;Bed level Lower Body Dressing Details (indicate cue type and reason): to don/doff socks and L prevalon boot     Toileting- Clothing Manipulation and Hygiene: Maximal assistance;+2 for physical assistance;Sit to/from stand Toileting - Clothing Manipulation Details (indicate cue type and reason): with bari RW from slightly elevated EOB     Functional mobility during ADLs: Minimal assistance;+2 for safety/equipment;Rolling walker (2 wheels) (bari RW, ~6-7 small shuffling steps from bed to recliner)      Extremity/Trunk Assessment Upper Extremity Assessment Upper Extremity Assessment: Generalized weakness   Lower Extremity Assessment Lower Extremity Assessment: Generalized weakness        Vision       Perception     Praxis      Cognition Arousal/Alertness: Awake/alert Behavior During Therapy: Flat affect;Anxious (fearful of falling initially, but becomes more amenable to mobilizing throughout session) Overall Cognitive Status: No family/caregiver present to determine baseline cognitive functioning                                 General Comments: Pt was A and O x 3. She required encouragement to participate however once motivated did fully participate. Somewhat drowsy/some delayed responses, but overall appropriate with simple commands          Exercises Other Exercises Other Exercises: OT engages pt in sitting and standing ADLs including bathing, dressing and grooming   Shoulder Instructions       General Comments      Pertinent Vitals/ Pain       Pain Assessment: No/denies pain Pain Score: 0-No pain Pain Location: L heel Pain Descriptors / Indicators: Tender Pain Intervention(s):  Limited activity within patient's tolerance;Monitored during session;RN gave pain meds during session;Repositioned  Home Living                                          Prior Functioning/Environment              Frequency  Min 2X/week        Progress Toward Goals  OT Goals(current goals can now be found in the care plan section)  Progress towards OT goals: Progressing toward goals  Acute Rehab OT Goals Patient Stated Goal: to go home OT Goal Formulation: With patient Time For Goal Achievement: 04/07/21 Potential to Achieve Goals: [place] Discharge plan remains appropriate;Frequency remains appropriate    Co-evaluation    PT/OT/SLP Co-Evaluation/Treatment: Yes Reason for Co-Treatment: Complexity of the patient's impairments (multi-system involvement);For patient/therapist safety;To address functional/ADL transfers PT goals addressed during session: Mobility/safety with mobility;Balance;Strengthening/ROM OT goals addressed during session: ADL's and self-care;Proper use of Adaptive equipment and DME      AM-PAC OT "6 Clicks" Daily Activity     Outcome Measure   Help from another person eating meals?: None Help from another person taking care of personal grooming?: A Little Help from another person toileting, which includes using toliet, bedpan, or urinal?: A Lot Help from another person bathing (including washing, rinsing,  drying)?: A Lot Help from another person to put on and taking off regular upper body clothing?: A Little Help from another person to put on and taking off regular lower body clothing?: A Lot 6 Click Score: 16    End of Session Equipment Utilized During Treatment: Gait belt;Rolling walker (2 wheels)  OT Visit Diagnosis: Unsteadiness on feet (R26.81);Muscle weakness (generalized) (M62.81);Pain Pain - Right/Left: Left Pain - part of body:  (heel)   Activity Tolerance Patient tolerated treatment well   Patient Left in  bed;with call bell/phone within reach;with bed alarm set   Nurse Communication Mobility status;Other (comment) (pt requesting medication for nausea)        Time: 6060-0459 OT Time Calculation (min): 45 min  Charges: OT General Charges $OT Visit: 1 Visit OT Treatments $Self Care/Home Management : 23-37 mins  Gerrianne Scale, MS, OTR/L [place] [phone] 03/29/21, 12:00 PM

## 2021-03-29 NOTE — Progress Notes (Signed)
Report called to Rosilyn Mings, LPN of [facility].  All questions answered and patient will transfer with foley in place, PIV removed, EMS transporting.

## 2021-03-29 NOTE — Progress Notes (Signed)
Physical Therapy Treatment Patient Details Name: [name] MRN: [id number] DOB: [birth year] Today's Date: 03/29/2021   History of Present Illness [name] is a 70 y.o. female with medical history significant for neurogenic bladder with indwelling Foley catheter in place, CKD stage IIIa, ADHD, anxiety, COPD, B-cell follicular lymphoma, hypertension, hypotension, kyphoscoliosis, multiple sclerosis, left femur fracture requiring ORIF in October 2021, morbid obesity, PVD, and left heel decubitus ulcer that is managed at the wound care clinic. BIB by EMS for genealized weakness and being tx'ed for AKI.    PT Comments    PT/OT co-treat 2/2 to pt's poor activity tolerance and need for +2 assistance for safety. Pt is alert but has flat affect. She agrees to session with encouragement. Session greatly limited by pt having massive, loose, BM while sitting EOB. She does continue to require a lot of assistance to exit L side of bed. Stood ~ 4 x throughout session for hygiene care to be performed prior to ambulating to recliner ~ 4 ft. She is limited by fatigue but overall tolerated session well. Highly recommend DC to SNF to address deficits while maximizing independence with ADLs.    Recommendations for follow up therapy are one component of a multi-disciplinary discharge planning process, led by the attending physician.  Recommendations may be updated based on patient status, additional functional criteria and insurance authorization.  Follow Up Recommendations  Skilled nursing-short term rehab (<3 hours/day)     Assistance Recommended at Discharge Frequent or constant Supervision/Assistance  Equipment Recommendations  None recommended by PT       Precautions / Restrictions Precautions Precautions: Fall Restrictions Weight Bearing Restrictions: No     Mobility  Bed Mobility Overal bed mobility: Needs Assistance Bed Mobility: Supine to Sit     Supine to sit: Max assist;+2 for  safety/equipment;HOB elevated     General bed mobility comments: Increased time to perform with Vcs throughout. +2 for safety    Transfers Overall transfer level: Needs assistance Equipment used: Rolling walker (2 wheels) Transfers: Sit to/from Stand Sit to Stand: +2 safety/equipment;Max assist;From elevated surface           General transfer comment: STS x4 for bathing/peri care and transfer to recliner. Large loose BM in EOB sitting. Tolerates ~74mins per stand to participate in self care    Ambulation/Gait Ambulation/Gait assistance: Min assist;Mod assist;+2 safety/equipment Gait Distance (Feet): 4 Feet Assistive device: Rolling walker (2 wheels) (bariatric) Gait Pattern/deviations: Step-to pattern;Trunk flexed;Decreased stride length;Decreased step length - left;Decreased step length - right Gait velocity: decreased     General Gait Details: Pt was able to ambulate to recliner with slow, step to gait pattern. no LOB however pt does get fatigued quickly    Balance Overall balance assessment: Needs assistance Sitting-balance support: Bilateral upper extremity supported Sitting balance-Leahy Scale: Fair Sitting balance - Comments: initially posterior leaning d/t fearful of falling, able to improve to F static sitting with cueing and B UE support Postural control: Posterior lean Standing balance support: Bilateral upper extremity supported Standing balance-Leahy Scale: Poor Standing balance comment: F static standing, P with fxl mobility d/t LE weakness/trmulous with steps       Cognition Arousal/Alertness: Awake/alert Behavior During Therapy: Flat affect;Anxious (fearful of falling initially, but becomes more amenable to mobilizing throughout session) Overall Cognitive Status: No family/caregiver present to determine baseline cognitive functioning      General Comments: Pt was A and O x 3. She required encouragement to participate however once motivated did fully  participate.  Somewhat drowsy/some delayed responses, but overall appropriate with simple commands        Exercises Other Exercises Other Exercises: OT engages pt in sitting and standing ADLs including bathing, dressing and grooming        Pertinent Vitals/Pain Pain Assessment: No/denies pain Pain Score: 0-No pain Pain Location: L heel Pain Descriptors / Indicators: Tender Pain Intervention(s): Limited activity within patient's tolerance;Monitored during session;RN gave pain meds during session;Repositioned     PT Goals (current goals can now be found in the care plan section) Acute Rehab PT Goals Patient Stated Goal: to go home after rehab Progress towards PT goals: Progressing toward goals    Frequency    Min 2X/week      PT Plan Current plan remains appropriate    Co-evaluation PT/OT/SLP Co-Evaluation/Treatment: Yes Reason for Co-Treatment: Complexity of the patient's impairments (multi-system involvement);For patient/therapist safety;To address functional/ADL transfers PT goals addressed during session: Mobility/safety with mobility;Balance;Strengthening/ROM OT goals addressed during session: ADL's and self-care;Proper use of Adaptive equipment and DME      AM-PAC PT "6 Clicks" Mobility   Outcome Measure  Help needed turning from your back to your side while in a flat bed without using bedrails?: A Lot Help needed moving from lying on your back to sitting on the side of a flat bed without using bedrails?: A Lot Help needed moving to and from a bed to a chair (including a wheelchair)?: A Lot Help needed standing up from a chair using your arms (e.g., wheelchair or bedside chair)?: A Lot Help needed to walk in hospital room?: A Lot Help needed climbing 3-5 steps with a railing? : Total 6 Click Score: 11    End of Session Equipment Utilized During Treatment: Gait belt Activity Tolerance: Patient limited by fatigue Patient left: in chair;with call bell/phone  within reach;with chair alarm set;with nursing/sitter in room Nurse Communication: Mobility status PT Visit Diagnosis: Unsteadiness on feet (R26.81);Other abnormalities of gait and mobility (R26.89);Muscle weakness (generalized) (M62.81);Difficulty in walking, not elsewhere classified (R26.2)     Time: 9470-9628 PT Time Calculation (min) (ACUTE ONLY): 45 min  Charges:  $Therapeutic Activity: 8-22 mins                     Julaine Fusi PTA 03/29/21, 11:52 AM

## 2021-03-29 NOTE — TOC Transition Note (Signed)
Transition of Care [facility]) - CM/SW Discharge Note   Patient Details  Name: [name] MRN: [id number] Date of Birth: 01/03/1951  [facility] CM/SW Contact:  Beverly Sessions, RN Phone Number: 03/29/2021, 2:44 PM   Clinical Narrative:     Patient will DC [facility] [place] Anticipated DC date:03/29/21  Family notified:husband Transport by:ems  Per MD patient ready for DC to . RN, patient, patient's family, and facility notified of DC. Discharge Summary sent to facility. RN given number for report. DC packet on chart. Ambulance transport requested for patient.  TOC signing off.  [facility] [phone]     Barriers to Discharge: Continued Medical Work up   Patient Goals and CMS Choice        Discharge Placement                       Discharge Plan and Services   Discharge Planning Services: CM Consult                                 Social Determinants of Health (SDOH) Interventions     Readmission Risk Interventions Readmission Risk Prevention Plan 03/26/2021 01/03/2020 04/22/2019  Transportation Screening Complete Complete Complete  PCP or Specialist Appt within 3-5 Days Complete - -  HRI or [place] for [place] Planning/Counseling Complete - -  Palliative Care Screening Complete - -  Medication Review [employer]) Complete Complete Referral to Pharmacy  PCP or Specialist appointment within 3-5 days of discharge - Complete Complete  HRI or [place] - Complete Complete  SW Recovery Care/Counseling Consult - Complete Complete  Palliative Care Screening - Not Applicable Not [place] - - Complete  Some recent data might be hidden

## 2021-03-29 NOTE — Discharge Summary (Signed)
Physician Discharge Summary  BROOKELYNNE DIMPERIO NIO:[id number] DOB: [birth year] DOA: 03/23/2021  PCP: Kirk Ruths, MD  Admit date: 03/23/2021 Discharge date: 03/29/2021  Admitted From: home Discharge disposition: SNF   Code Status: Full Code   Discharge Diagnosis:   Principal Problem:   AKI (acute kidney injury) [facility]) Active Problems:   Cellulitis of left lower extremity    Chief Complaint  Patient presents with   Weakness    EMS reports they were called out due to generalized weakness today.     Brief narrative: [name] is a 70 y.o. female with PMH significant for morbid obesity, COPD, HTN, CKD stage III, multiple sclerosis, neurogenic bladder with indwelling Foley catheter in place, B-cell follicular lymphoma, ADHD, anxiety, PVD, and left heel decubitus ulcer was living at home with her husband. She was brought to the ED on 11/25 for generalized weakness, malaise, left heel decubitus ulcer with some drainage, painful swelling and redness of the left leg. Patient was admitted for left leg cellulitis and heel ulcer. Started on IV Rocephin.  Podiatry consulted.  Prior to admission, patient had recent debridement done at the wound care clinic on 11/22.  She did not require any further debridement in the hospital. PT eval was obtained.  SNF recommended.  Currently pending placement.  Subjective: Patient was seen and examined this morning.  Working with physical therapy.  Seems pretty weak.  Hospital course Left leg cellulitis and unstageable left heel decubitus ulcer -Currently on IV Rocephin.  Blood culture did not show any growth. S/p left heel decubitus ulcer was debrided at wound care clinic on 03/20/2021. -Continue w/ wound care as per podiatry. ABI is normal. No further debridements needed at time as per podiatry. Will need f/u outpatient w/ podiatry  -Discharge on 5 more days of oral Omnicef with probiotics.  Continue wound care with Betadine and dry  dressing.  AKI on CKD 3a -Creatinine improved to normal Recent Labs    03/23/21 0128 03/24/21 0558 03/25/21 0543 03/26/21 0419 03/27/21 0219 03/28/21 0557  BUN 45* 46* 37* 26* 19 15  CREATININE 2.22* 2.23* 1.66* 1.31* 1.18* 1.03*   Essential hypertension -Continue amlodipine.  Lisinopril and torsemide on hold.  Blood pressure gradually rising up.  Resume both post discharge.   Anemia chronic disease -Hemoglobin stable between 8 and 9 for last 5 days. Recent Labs    03/24/21 0558 03/25/21 0543 03/26/21 0419 03/27/21 0219 03/28/21 0557  HGB 8.5* 8.2* 8.2* 8.8* 8.9*  MCV 87.8 88.9 86.4 86.7 85.8   COPD -Continue bronchodilators & encourage incentive spirometry   Multiple sclerosis  Neurogenic bladder: w/ chronic indwelling foley catheter -No symptoms of UTI but urine culture obtained on admission grew enterococcus faecalis, proteus, & pseudomonas likely secondary to colonization vs containment vs chronic indwelling foley cathter.  -No fevers, normal WBC. Will continue to monitor.   Generalized weakness -w/ hx of MS. PT/OT recs SNF. CM is working on SNF placement    Morbid obesity  -Body mass index is 50.46 kg/m. Patient has been advised to make an attempt to improve diet and exercise patterns to aid in weight loss.   Allergies as of 03/29/2021   No Known Allergies      Medication List     STOP taking these medications    polyethylene glycol powder 17 GM/SCOOP powder Commonly known as: GLYCOLAX/MIRALAX Replaced by: polyethylene glycol 17 g packet       TAKE these medications    acetaminophen 500 MG  tablet Commonly known as: TYLENOL Take 1,000 mg by mouth every 8 (eight) hours as needed for moderate pain.   amLODipine 5 MG tablet Commonly known as: NORVASC Take 5 mg by mouth daily.   atorvastatin 10 MG tablet Commonly known as: LIPITOR Take 10 mg by mouth at bedtime.   budesonide-formoterol 160-4.5 MCG/ACT inhaler Commonly known as:  SYMBICORT Inhale 2 puffs into the lungs 2 (two) times daily.   buPROPion 300 MG 24 hr tablet Commonly known as: WELLBUTRIN XL Take 300 mg by mouth daily.   cefdinir 300 MG capsule Commonly known as: OMNICEF Take 1 capsule (300 mg total) by mouth every 12 (twelve) hours for 5 days. Start taking on: March 30, 2021   clonazePAM 0.5 MG tablet Commonly known as: KLONOPIN Take 1 tablet (0.5 mg total) by mouth 2 (two) times daily.   cyanocobalamin 1000 MCG tablet Take 1,000 mcg by mouth daily.   docusate sodium 100 MG capsule Commonly known as: COLACE Take 1 capsule (100 mg total) by mouth 2 (two) times daily as needed.   DULoxetine 60 MG capsule Commonly known as: CYMBALTA Take 1 capsule (60 mg total) by mouth every morning. What changed: when to take this   gabapentin 600 MG tablet Commonly known as: NEURONTIN Take 600 mg by mouth 3 (three) times daily.   hydrALAZINE 50 MG tablet Commonly known as: APRESOLINE Take 50 mg by mouth in the morning and at bedtime.   lisinopril 20 MG tablet Commonly known as: ZESTRIL Take 0.5 tablets (10 mg total) by mouth daily. What changed: how much to take   magnesium oxide 400 MG tablet Commonly known as: MAG-OX Take 400 mg by mouth daily.   multivitamin with minerals Tabs tablet Take 1 tablet by mouth daily.   Myrbetriq 50 MG Tb24 tablet Generic drug: mirabegron ER TAKE ONE TABLET BY MOUTH ONCE DAILY   oxyCODONE 5 MG immediate release tablet Commonly known as: Oxy IR/ROXICODONE Take 1 tablet (5 mg total) by mouth every 8 (eight) hours as needed for up to 3 days for moderate pain. What changed:  when to take this reasons to take this   polyethylene glycol 17 g packet Commonly known as: MIRALAX / GLYCOLAX Take 17 g by mouth daily as needed for mild constipation. Replaces: polyethylene glycol powder 17 GM/SCOOP powder   QUEtiapine Fumarate 150 MG 24 hr tablet Commonly known as: SEROQUEL XR Take 150 mg by mouth at bedtime.    saccharomyces boulardii 250 MG capsule Commonly known as: FLORASTOR Take 1 capsule (250 mg total) by mouth 2 (two) times daily.   torsemide 20 MG tablet Commonly known as: DEMADEX Take 20 mg by mouth daily.   traZODone 50 MG tablet Commonly known as: DESYREL Take 50 mg by mouth at bedtime.   vortioxetine HBr 5 MG Tabs tablet Commonly known as: TRINTELLIX Take 5 mg by mouth daily.               Durable Medical Equipment  (From admission, onward)           Start     Ordered   03/24/21 1558  For home use only DME Other see comment  Once       Comments: Prafo/Prevalon boot to offload heel.  Question:  Length of Need  Answer:  6 Months   03/24/21 1558              Discharge Care Instructions  (From admission, onward)  Start     Ordered   03/29/21 0000  Discharge wound care:       Comments: Betadine and dry dressing.   03/29/21 1042            Discharge Instructions:  Diet Recommendation:  Cardiac diet  General discharge instructions:  Follow with Primary MD Kirk Ruths, MD in 7 days   Get CBC/BMP checked in next visit within 1 week by PCP or SNF MD. (We routinely change or add medications that can affect your baseline labs and fluid status, therefore we recommend that you get the mentioned basic workup next visit with your PCP, your PCP may decide not to get them or add new tests based on their clinical decision)  On your next visit with your PCP, please get your medicines reviewed and adjusted.  Please request your PCP  to go over all hospital tests, procedures, radiology results at the follow up, please get all Hospital records sent to your PCP by signing hospital release before you go home.  Activity: As tolerated with Full fall precautions use walker/cane & assistance as needed  Avoid using any recreational substances like cigarette, tobacco, alcohol, or non-prescribed drug.  If you experience worsening of your  admission symptoms, develop shortness of breath, life threatening emergency, suicidal or homicidal thoughts you must seek medical attention immediately by calling 911 or calling your MD immediately  if symptoms less severe.  You must read complete instructions/literature along with all the possible adverse reactions/side effects for all the medicines you take and that have been prescribed to you. Take any new medicine only after you have completely understood and accepted all the possible adverse reactions/side effects.   Do not drive, operate heavy machinery, perform activities at heights, swimming or participation in water activities or provide baby sitting services if your were admitted for syncope or siezures until you have seen by Primary MD or a Neurologist and advised to do so again.  Do not drive when taking Pain medications.  Do not take more than prescribed Pain, Sleep and Anxiety Medications  Wear Seat belts while driving.  Please note You were cared for by a hospitalist during your hospital stay. If you have any questions about your discharge medications or the care you received while you were in the hospital after you are discharged, you can call the unit and asked to speak with the hospitalist on call if the hospitalist that took care of you is not available. Once you are discharged, your primary care physician will handle any further medical issues. Please note that NO REFILLS for any discharge medications will be authorized once you are discharged, as it is imperative that you return to your primary care physician (or establish a relationship with a primary care physician if you do not have one) for your aftercare needs so that they can reassess your need for medications and monitor your lab values.   Follow ups:    Follow-up Information     Kirk Ruths, MD Follow up.   Specialty: Internal Medicine Contact information: [place]  [id number] [phone]                 Wound care:   Pressure Injury 10/20/19 Buttocks Left Stage 2 -  Partial thickness loss of dermis presenting as a shallow open injury with a red, pink wound bed without slough. (Active)  Date First Assessed/Time First Assessed: 10/20/19 2125   Location: Buttocks  Location Orientation:  Left  Staging: Stage 2 -  Partial thickness loss of dermis presenting as a shallow open injury with a red, pink wound bed without slough.    Assessments 10/20/2019  9:00 PM 02/10/2020  9:20 AM  Dressing Type Foam - Lift dressing to assess site every shift Foam - Lift dressing to assess site every shift  Dressing Clean;Dry;Intact --  Site / Wound Assessment Pink;Red --  Wound Length (cm) 0.5 cm --  Wound Width (cm) 0.5 cm --  Wound Surface Area (cm^2) 0.25 cm^2 --  Drainage Amount None --  Treatment Cleansed;Other (Comment) --     No Linked orders to display     Pressure Injury 10/20/19 Coccyx Mid Stage 2 -  Partial thickness loss of dermis presenting as a shallow open injury with a red, pink wound bed without slough. (Active)  Date First Assessed/Time First Assessed: 10/20/19 2125   Location: Coccyx  Location Orientation: Mid  Staging: Stage 2 -  Partial thickness loss of dermis presenting as a shallow open injury with a red, pink wound bed without slough.    Assessments 10/20/2019  9:00 PM 02/09/2020 10:00 AM  Dressing Type Foam - Lift dressing to assess site every shift --  Dressing Clean;Dry;Intact --  Site / Wound Assessment Pink;Red --  Wound Length (cm) 0.5 cm 0 cm  Wound Width (cm) 0.5 cm 0 cm  Wound Depth (cm) -- 0 cm  Wound Surface Area (cm^2) 0.25 cm^2 0 cm^2  Wound Volume (cm^3) -- 0 cm^3  Drainage Amount None --  Treatment Cleansed;Other (Comment) --     No Linked orders to display     Incision (Closed) 02/04/20 Leg Left (Active)  Date First Assessed/Time First Assessed: 02/04/20 1605   Location: Leg  Location Orientation: Left     Assessments 02/04/2020  4:41 PM 02/10/2020  9:20 AM  Dressing Type Compression wrap None  Dressing Clean;Dry;Intact --  Site / Wound Assessment Dressing in place / Unable to assess Clean;Dry  Drainage Amount -- None     No Linked orders to display     Pressure Injury 03/24/21 Heel Left Unstageable - Full thickness tissue loss in which the base of the injury is covered by slough (yellow, tan, gray, green or brown) and/or eschar (tan, brown or black) in the wound bed. (Active)  Date First Assessed/Time First Assessed: 03/24/21 0314   Location: Heel  Location Orientation: Left  Staging: Unstageable - Full thickness tissue loss in which the base of the injury is covered by slough (yellow, tan, gray, green or brown) and/or esch...    Assessments 03/24/2021  3:08 AM 03/29/2021  8:30 AM  Dressing Type -- Gauze (Comment)  Dressing -- Clean;Dry;Intact  Site / Wound Assessment Dry --  Drainage Amount -- None     No Linked orders to display    Discharge Exam:   Vitals:   03/28/21 1704 03/28/21 1941 03/29/21 0445 03/29/21 0741  BP: (!) 149/65 (!) 154/84 (!) 137/118 (!) 146/68  Pulse: 66 65 63 63  Resp: 18 17 16 18   Temp: 98.9 F (37.2 C) 98 F (36.7 C) 98 F (36.7 C) 98.4 F (36.9 C)  TempSrc: Oral Oral Oral Oral  SpO2: 98% 98% 97% 96%  Weight:      Height:        Body mass index is 50.46 kg/m.  General exam: Pleasant, elderly Caucasian. Not in physical distress Skin: No rashes, lesions or ulcers. HEENT: Atraumatic, normocephalic, no obvious bleeding Lungs: Clear to auscultation  bilaterally CVS: Regular rate and rhythm, no murmur GI/Abd soft, nontender, nondistended, bowel sound present CNS: Alert, awake, oriented x3 Psychiatry: Sad affect Extremities: No pedal edema, no calf tenderness.  Left foot ulcer bandaged.  Time coordinating discharge: 35 minutes   The results of significant diagnostics from this hospitalization (including imaging, microbiology, ancillary and  laboratory) are listed below for reference.    Procedures and Diagnostic Studies:   DG Ankle Complete Left  Result Date: 03/23/2021 CLINICAL DATA:  Ulceration, infection EXAM: LEFT ANKLE COMPLETE - 3+ VIEW COMPARISON:  None. FINDINGS: No acute bony abnormality. Specifically, no fracture, subluxation, or dislocation. No bone destruction to suggest osteomyelitis. IMPRESSION: No acute bony abnormality. Electronically Signed   By: Rolm Baptise M.D.   On: 03/23/2021 09:53   DG Chest Portable 1 View  Result Date: 03/23/2021 CLINICAL DATA:  Shortness of breath, generalized weakness EXAM: PORTABLE CHEST 1 VIEW COMPARISON:  01/26/2020 FINDINGS: Low lung volumes. Heart and mediastinal contours are within normal limits. No focal opacities or effusions. No acute bony abnormality. IMPRESSION: No active cardiopulmonary disease. Electronically Signed   By: Rolm Baptise M.D.   On: 03/23/2021 09:13     Labs:   Basic Metabolic Panel: Recent Labs  Lab 03/23/21 0128 03/24/21 0558 03/25/21 0543 03/26/21 0419 03/27/21 0219 03/28/21 0557  NA 131* 134* 136 135 134* 135  K 5.2* 4.8 4.2 4.1 4.4 3.8  CL 103 106 109 107 107 107  CO2 22 23 23 23 23 24   GLUCOSE 102* 128* 91 85 99 86  BUN 45* 46* 37* 26* 19 15  CREATININE 2.22* 2.23* 1.66* 1.31* 1.18* 1.03*  CALCIUM 8.3* 8.6* 8.4* 8.4* 8.5* 8.9  MG 2.5*  --   --   --   --   --    GFR Estimated Creatinine Clearance: 69.2 mL/min (A) (by C-G formula based on SCr of 1.03 mg/dL (H)). Liver Function Tests: Recent Labs  Lab 03/23/21 0128  AST 15  ALT 16  ALKPHOS 54  BILITOT 0.7  PROT 6.4*  ALBUMIN 3.6   No results for input(s): LIPASE, AMYLASE in the last 168 hours. No results for input(s): AMMONIA in the last 168 hours. Coagulation profile No results for input(s): INR, PROTIME in the last 168 hours.  CBC: Recent Labs  Lab 03/23/21 0128 03/24/21 0558 03/25/21 0543 03/26/21 0419 03/27/21 0219 03/28/21 0557  WBC 10.6* 6.8 5.0 5.3 5.1 5.5   NEUTROABS 8.8*  --   --   --   --   --   HGB 8.9* 8.5* 8.2* 8.2* 8.8* 8.9*  HCT 28.3* 26.6* 25.7* 26.0* 27.4* 27.7*  MCV 87.9 87.8 88.9 86.4 86.7 85.8  PLT 179 151 158 178 180 192   Cardiac Enzymes: No results for input(s): CKTOTAL, CKMB, CKMBINDEX, TROPONINI in the last 168 hours. BNP: Invalid input(s): POCBNP CBG: Recent Labs  Lab 03/23/21 0649  GLUCAP 98   D-Dimer No results for input(s): DDIMER in the last 72 hours. Hgb A1c No results for input(s): HGBA1C in the last 72 hours. Lipid Profile No results for input(s): CHOL, HDL, LDLCALC, TRIG, CHOLHDL, LDLDIRECT in the last 72 hours. Thyroid function studies No results for input(s): TSH, T4TOTAL, T3FREE, THYROIDAB in the last 72 hours.  Invalid input(s): FREET3 Anemia work up No results for input(s): VITAMINB12, FOLATE, FERRITIN, TIBC, IRON, RETICCTPCT in the last 72 hours. Microbiology Recent Results (from the past 240 hour(s))  Resp Panel by RT-PCR (Flu A&B, Covid) Nasopharyngeal Swab     Status: None  Collection Time: 03/23/21  1:28 AM   Specimen: Nasopharyngeal Swab; Nasopharyngeal(NP) swabs in vial transport medium  Result Value Ref Range Status   SARS Coronavirus 2 by RT PCR NEGATIVE NEGATIVE Final    Comment: (NOTE) SARS-CoV-2 target nucleic acids are NOT DETECTED.  The SARS-CoV-2 RNA is generally detectable in upper respiratory specimens during the acute phase of infection. The lowest concentration of SARS-CoV-2 viral copies this assay can detect is 138 copies/mL. A negative result does not preclude SARS-Cov-2 infection and should not be used as the sole basis for treatment or other patient management decisions. A negative result may occur with  improper specimen collection/handling, submission of specimen other than nasopharyngeal swab, presence of viral mutation(s) within the areas targeted by this assay, and inadequate number of viral copies(<138 copies/mL). A negative result must be combined  with clinical observations, patient history, and epidemiological information. The expected result is Negative.  Fact Sheet for Patients:  EntrepreneurPulse.com.au  Fact Sheet for Healthcare Providers:  IncredibleEmployment.be  This test is no t yet approved or cleared by the [place] FDA and  has been authorized for detection and/or diagnosis of SARS-CoV-2 by FDA under an Emergency Use Authorization (EUA). This EUA will remain  in effect (meaning this test can be used) for the duration of the COVID-19 declaration under Section 564(b)(1) of the Act, 21 U.S.C.section 360bbb-3(b)(1), unless the authorization is terminated  or revoked sooner.       Influenza A by PCR NEGATIVE NEGATIVE Final   Influenza B by PCR NEGATIVE NEGATIVE Final    Comment: (NOTE) The Xpert Xpress SARS-CoV-2/FLU/RSV plus assay is intended as an aid in the diagnosis of influenza from Nasopharyngeal swab specimens and should not be used as a sole basis for treatment. Nasal washings and aspirates are unacceptable for Xpert Xpress SARS-CoV-2/FLU/RSV testing.  Fact Sheet for Patients: EntrepreneurPulse.com.au  Fact Sheet for Healthcare Providers: IncredibleEmployment.be  This test is not yet approved or cleared by the [place] FDA and has been authorized for detection and/or diagnosis of SARS-CoV-2 by FDA under an Emergency Use Authorization (EUA). This EUA will remain in effect (meaning this test can be used) for the duration of the COVID-19 declaration under Section 564(b)(1) of the Act, 21 U.S.C. section 360bbb-3(b)(1), unless the authorization is terminated or revoked.  Performed at [facility], [address], [place], [place] [address]   Urine Culture     Status: Abnormal   Collection Time: 03/23/21  5:41 AM   Specimen: Urine, Clean Catch  Result Value Ref Range Status   Specimen Description   Final     URINE, CLEAN CATCH Performed at [facility], [address], [place], [place] [address]    Special Requests   Final    NONE Performed at [facility], [place], [place] [address]    Culture (A)  Final    50,000 COLONIES/mL PSEUDOMONAS AERUGINOSA 30,000 COLONIES/mL PROTEUS MIRABILIS >=100,000 COLONIES/mL ENTEROCOCCUS FAECALIS VANCOMYCIN RESISTANT ENTEROCOCCUS    Report Status 03/26/2021 FINAL  Final   Organism ID, Bacteria PSEUDOMONAS AERUGINOSA (A)  Final   Organism ID, Bacteria ENTEROCOCCUS FAECALIS (A)  Final      Susceptibility   Enterococcus faecalis - MIC*    AMPICILLIN <=2 SENSITIVE Sensitive     NITROFURANTOIN <=16 SENSITIVE Sensitive     VANCOMYCIN >=32 RESISTANT Resistant     LINEZOLID 2 SENSITIVE Sensitive     * >=100,000 COLONIES/mL ENTEROCOCCUS FAECALIS   Pseudomonas aeruginosa - MIC*  CEFTAZIDIME 4 SENSITIVE Sensitive     CIPROFLOXACIN INTERMEDIATE Intermediate     GENTAMICIN <=1 SENSITIVE Sensitive     IMIPENEM 2 SENSITIVE Sensitive     * 50,000 COLONIES/mL PSEUDOMONAS AERUGINOSA  Blood culture (routine x 2)     Status: None   Collection Time: 03/23/21 10:33 AM   Specimen: BLOOD  Result Value Ref Range Status   Specimen Description BLOOD BLOOD RIGHT HAND  Final   Special Requests   Final    BOTTLES DRAWN AEROBIC AND ANAEROBIC Blood Culture results may not be optimal due to an inadequate volume of blood received in culture bottles   Culture   Final    NO GROWTH 5 DAYS Performed at [facility], [address], [place], [place] [address]    Report Status 03/28/2021 FINAL  Final  Blood culture (routine x 2)     Status: None   Collection Time: 03/23/21  8:14 PM   Specimen: BLOOD  Result Value Ref Range Status   Specimen Description BLOOD LEFT ANTECUBITAL  Final   Special Requests   Final    BOTTLES DRAWN AEROBIC AND ANAEROBIC Blood Culture adequate volume   Culture   Final    NO GROWTH 5 DAYS Performed at  [facility], [address] [place] [address]    Report Status 03/28/2021 FINAL  Final     Signed: Marlowe Aschoff Aleph Nickson  [facility] 03/29/2021, 10:42 AM

## 2021-03-30 ENCOUNTER — Ambulatory Visit: Payer: Medicaid Other | Admitting: Physician Assistant

## 2021-04-19 ENCOUNTER — Encounter: Payer: Self-pay | Admitting: Urology

## 2021-04-19 ENCOUNTER — Ambulatory Visit: Payer: Self-pay | Admitting: Urology

## 2021-05-03 ENCOUNTER — Telehealth: Payer: Self-pay | Admitting: *Deleted

## 2021-05-03 NOTE — Telephone Encounter (Signed)
Nurse calling stating that pt has home health order and she wanted orders to change cath. I advised pt has not been seen since 03/2020 and pt would need to be seen. Nurse will reach out to PCP to see who has been managing cath and call back if any questions.

## 2021-06-30 ENCOUNTER — Observation Stay: Payer: Medicare (Managed Care)

## 2021-06-30 ENCOUNTER — Emergency Department: Payer: Medicare (Managed Care)

## 2021-06-30 ENCOUNTER — Other Ambulatory Visit: Payer: Self-pay

## 2021-06-30 ENCOUNTER — Inpatient Hospital Stay
Admission: EM | Admit: 2021-06-30 | Discharge: 2021-07-05 | DRG: 059 | Disposition: A | Discharge status: Skilled Nursing Facility | Payer: Medicare (Managed Care) | Attending: Internal Medicine | Admitting: Internal Medicine

## 2021-06-30 ENCOUNTER — Encounter: Payer: Self-pay | Admitting: Emergency Medicine

## 2021-06-30 DIAGNOSIS — Z823 Family history of stroke: Secondary | ICD-10-CM

## 2021-06-30 DIAGNOSIS — Z79899 Other long term (current) drug therapy: Secondary | ICD-10-CM

## 2021-06-30 DIAGNOSIS — F32A Depression, unspecified: Secondary | ICD-10-CM | POA: Diagnosis present

## 2021-06-30 DIAGNOSIS — I10 Essential (primary) hypertension: Secondary | ICD-10-CM | POA: Diagnosis present

## 2021-06-30 DIAGNOSIS — G35 Multiple sclerosis: Principal | ICD-10-CM | POA: Diagnosis present

## 2021-06-30 DIAGNOSIS — N390 Urinary tract infection, site not specified: Secondary | ICD-10-CM | POA: Diagnosis present

## 2021-06-30 DIAGNOSIS — F419 Anxiety disorder, unspecified: Secondary | ICD-10-CM | POA: Diagnosis present

## 2021-06-30 DIAGNOSIS — Z6841 Body Mass Index (BMI) 40.0 and over, adult: Secondary | ICD-10-CM

## 2021-06-30 DIAGNOSIS — N318 Other neuromuscular dysfunction of bladder: Secondary | ICD-10-CM | POA: Diagnosis present

## 2021-06-30 DIAGNOSIS — Z20822 Contact with and (suspected) exposure to covid-19: Secondary | ICD-10-CM | POA: Diagnosis present

## 2021-06-30 DIAGNOSIS — Z841 Family history of disorders of kidney and ureter: Secondary | ICD-10-CM

## 2021-06-30 DIAGNOSIS — M21372 Foot drop, left foot: Secondary | ICD-10-CM | POA: Diagnosis present

## 2021-06-30 DIAGNOSIS — N179 Acute kidney failure, unspecified: Secondary | ICD-10-CM | POA: Diagnosis present

## 2021-06-30 DIAGNOSIS — I129 Hypertensive chronic kidney disease with stage 1 through stage 4 chronic kidney disease, or unspecified chronic kidney disease: Secondary | ICD-10-CM | POA: Diagnosis present

## 2021-06-30 DIAGNOSIS — Z23 Encounter for immunization: Secondary | ICD-10-CM

## 2021-06-30 DIAGNOSIS — Z825 Family history of asthma and other chronic lower respiratory diseases: Secondary | ICD-10-CM

## 2021-06-30 DIAGNOSIS — Z978 Presence of other specified devices: Secondary | ICD-10-CM

## 2021-06-30 DIAGNOSIS — J449 Chronic obstructive pulmonary disease, unspecified: Secondary | ICD-10-CM | POA: Diagnosis present

## 2021-06-30 DIAGNOSIS — N1831 Chronic kidney disease, stage 3a: Secondary | ICD-10-CM | POA: Diagnosis present

## 2021-06-30 DIAGNOSIS — R8281 Pyuria: Secondary | ICD-10-CM | POA: Diagnosis present

## 2021-06-30 DIAGNOSIS — R296 Repeated falls: Secondary | ICD-10-CM | POA: Diagnosis present

## 2021-06-30 DIAGNOSIS — Z7951 Long term (current) use of inhaled steroids: Secondary | ICD-10-CM

## 2021-06-30 DIAGNOSIS — N183 Chronic kidney disease, stage 3 unspecified: Secondary | ICD-10-CM | POA: Diagnosis present

## 2021-06-30 DIAGNOSIS — R29898 Other symptoms and signs involving the musculoskeletal system: Secondary | ICD-10-CM

## 2021-06-30 DIAGNOSIS — Z8249 Family history of ischemic heart disease and other diseases of the circulatory system: Secondary | ICD-10-CM

## 2021-06-30 DIAGNOSIS — Z833 Family history of diabetes mellitus: Secondary | ICD-10-CM

## 2021-06-30 DIAGNOSIS — Z87891 Personal history of nicotine dependence: Secondary | ICD-10-CM

## 2021-06-30 DIAGNOSIS — Z993 Dependence on wheelchair: Secondary | ICD-10-CM

## 2021-06-30 DIAGNOSIS — W19XXXA Unspecified fall, initial encounter: Secondary | ICD-10-CM | POA: Diagnosis present

## 2021-06-30 DIAGNOSIS — Z8261 Family history of arthritis: Secondary | ICD-10-CM

## 2021-06-30 LAB — COMPREHENSIVE METABOLIC PANEL
ALT: 13 U/L (ref 0–44)
AST: 16 U/L (ref 15–41)
Albumin: 4 g/dL (ref 3.5–5.0)
Alkaline Phosphatase: 68 U/L (ref 38–126)
Anion gap: 6 (ref 5–15)
BUN: 24 mg/dL — ABNORMAL HIGH (ref 8–23)
CO2: 28 mmol/L (ref 22–32)
Calcium: 9.3 mg/dL (ref 8.9–10.3)
Chloride: 101 mmol/L (ref 98–111)
Creatinine, Ser: 1.51 mg/dL — ABNORMAL HIGH (ref 0.44–1.00)
GFR, Estimated: 37 mL/min — ABNORMAL LOW (ref >60)
Glucose, Bld: 107 mg/dL — ABNORMAL HIGH (ref 70–99)
Potassium: 3.9 mmol/L (ref 3.5–5.1)
Sodium: 135 mmol/L (ref 135–145)
Total Bilirubin: 0.4 mg/dL (ref 0.3–1.2)
Total Protein: 6.9 g/dL (ref 6.5–8.1)

## 2021-06-30 LAB — URINALYSIS, ROUTINE W REFLEX MICROSCOPIC
Bacteria, UA: NONE SEEN
Bilirubin Urine: NEGATIVE
Glucose, UA: NEGATIVE mg/dL
Ketones, ur: NEGATIVE mg/dL
Nitrite: NEGATIVE
Protein, ur: NEGATIVE mg/dL
Specific Gravity, Urine: 1.006 (ref 1.005–1.030)
pH: 6 (ref 5.0–8.0)

## 2021-06-30 LAB — CBC
HCT: 34.2 % — ABNORMAL LOW (ref 36.0–46.0)
Hemoglobin: 10.5 g/dL — ABNORMAL LOW (ref 12.0–15.0)
MCH: 26.7 pg (ref 26.0–34.0)
MCHC: 30.7 g/dL (ref 30.0–36.0)
MCV: 87 fL (ref 80.0–100.0)
Platelets: 211 10*3/uL (ref 150–400)
RBC: 3.93 MIL/uL (ref 3.87–5.11)
RDW: 14.6 % (ref 11.5–15.5)
WBC: 8.5 10*3/uL (ref 4.0–10.5)
nRBC: 0 % (ref 0.0–0.2)

## 2021-06-30 LAB — RESP PANEL BY RT-PCR (FLU A&B, COVID) ARPGX2
Influenza A by PCR: NEGATIVE
Influenza B by PCR: NEGATIVE
SARS Coronavirus 2 by RT PCR: NEGATIVE

## 2021-06-30 LAB — LACTIC ACID, PLASMA: Lactic Acid, Venous: 1.1 mmol/L (ref 0.5–1.9)

## 2021-06-30 MED ORDER — INFLUENZA VAC A&B SA ADJ QUAD 0.5 ML IM PRSY
0.5000 mL | PREFILLED_SYRINGE | INTRAMUSCULAR | Status: AC
  Administered 2021-07-01: 0.5 mL via INTRAMUSCULAR
  Filled 2021-06-30: qty 0.5

## 2021-06-30 MED ORDER — CLONAZEPAM 0.5 MG PO TABS
0.5000 mg | ORAL_TABLET | Freq: Two times a day (BID) | ORAL | Status: DC
  Administered 2021-06-30 – 2021-07-05 (×11): 0.5 mg via ORAL
  Filled 2021-06-30 (×11): qty 1

## 2021-06-30 MED ORDER — TORSEMIDE 20 MG PO TABS
20.0000 mg | ORAL_TABLET | Freq: Every day | ORAL | Status: DC
Start: 2021-06-30 — End: 2021-07-01
  Administered 2021-06-30 – 2021-07-01 (×2): 20 mg via ORAL
  Filled 2021-06-30 (×2): qty 1

## 2021-06-30 MED ORDER — MIRABEGRON ER 50 MG PO TB24
50.0000 mg | ORAL_TABLET | Freq: Every day | ORAL | Status: DC
Start: 2021-06-30 — End: 2021-07-05
  Administered 2021-06-30 – 2021-07-05 (×6): 50 mg via ORAL
  Filled 2021-06-30 (×6): qty 1

## 2021-06-30 MED ORDER — DULOXETINE HCL 30 MG PO CPEP
60.0000 mg | ORAL_CAPSULE | Freq: Every day | ORAL | Status: DC
  Administered 2021-06-30 – 2021-07-05 (×6): 60 mg via ORAL
  Filled 2021-06-30 (×2): qty 2
  Filled 2021-06-30: qty 1
  Filled 2021-06-30 (×3): qty 2

## 2021-06-30 MED ORDER — GADOBUTROL 1 MMOL/ML IV SOLN
10.0000 mL | Freq: Once | INTRAVENOUS | Status: AC | PRN
  Administered 2021-06-30: 10 mL via INTRAVENOUS
  Filled 2021-06-30: qty 10

## 2021-06-30 MED ORDER — VORTIOXETINE HBR 5 MG PO TABS
5.0000 mg | ORAL_TABLET | Freq: Every day | ORAL | Status: DC
Start: 2021-06-30 — End: 2021-07-05
  Administered 2021-06-30 – 2021-07-05 (×6): 5 mg via ORAL
  Filled 2021-06-30 (×6): qty 1

## 2021-06-30 MED ORDER — DOCUSATE SODIUM 100 MG PO CAPS: 100.0000 mg | ORAL_CAPSULE | Freq: Two times a day (BID) | ORAL | Status: DC | PRN

## 2021-06-30 MED ORDER — ONDANSETRON HCL 4 MG PO TABS: 4.0000 mg | ORAL_TABLET | Freq: Four times a day (QID) | ORAL | Status: DC | PRN

## 2021-06-30 MED ORDER — HYDRALAZINE HCL 50 MG PO TABS: 50.0000 mg | ORAL_TABLET | Freq: Two times a day (BID) | ORAL | Status: DC

## 2021-06-30 MED ORDER — ONDANSETRON HCL 4 MG/2ML IJ SOLN: 4.0000 mg | Freq: Four times a day (QID) | INTRAMUSCULAR | Status: DC | PRN

## 2021-06-30 MED ORDER — SODIUM CHLORIDE 0.9 % IV SOLN
1.0000 g | Freq: Once | INTRAVENOUS | Status: AC
  Administered 2021-07-01: 1 g via INTRAVENOUS
  Filled 2021-06-30: qty 10

## 2021-06-30 MED ORDER — SODIUM CHLORIDE 0.9% FLUSH: 3.0000 mL | INTRAVENOUS | Status: DC | PRN

## 2021-06-30 MED ORDER — ADULT MULTIVITAMIN W/MINERALS CH
1.0000 | ORAL_TABLET | Freq: Every day | ORAL | Status: DC
  Administered 2021-06-30 – 2021-07-05 (×6): 1 via ORAL
  Filled 2021-06-30 (×6): qty 1

## 2021-06-30 MED ORDER — TRAZODONE HCL 50 MG PO TABS
50.0000 mg | ORAL_TABLET | Freq: Every day | ORAL | Status: DC
  Administered 2021-06-30 – 2021-07-04 (×5): 50 mg via ORAL
  Filled 2021-06-30 (×5): qty 1

## 2021-06-30 MED ORDER — BUPROPION HCL ER (XL) 150 MG PO TB24
300.0000 mg | ORAL_TABLET | Freq: Every day | ORAL | Status: DC
  Administered 2021-06-30 – 2021-07-05 (×6): 300 mg via ORAL
  Filled 2021-06-30 (×6): qty 2

## 2021-06-30 MED ORDER — SODIUM CHLORIDE 0.9 % IV SOLN
1.0000 g | Freq: Once | INTRAVENOUS | Status: AC
  Administered 2021-06-30: 1 g via INTRAVENOUS
  Filled 2021-06-30: qty 10

## 2021-06-30 MED ORDER — AMLODIPINE BESYLATE 5 MG PO TABS
5.0000 mg | ORAL_TABLET | Freq: Every day | ORAL | Status: DC
  Administered 2021-06-30 – 2021-07-01 (×2): 5 mg via ORAL
  Filled 2021-06-30 (×2): qty 1

## 2021-06-30 MED ORDER — TERIFLUNOMIDE 14 MG PO TABS: 14.0000 mg | ORAL_TABLET | Freq: Every day | ORAL | Status: DC

## 2021-06-30 MED ORDER — ATORVASTATIN CALCIUM 10 MG PO TABS
10.0000 mg | ORAL_TABLET | Freq: Every day | ORAL | Status: DC
Start: 2021-06-30 — End: 2021-07-05
  Administered 2021-06-30 – 2021-07-04 (×5): 10 mg via ORAL
  Filled 2021-06-30 (×5): qty 1

## 2021-06-30 MED ORDER — ENOXAPARIN SODIUM 60 MG/0.6ML IJ SOSY
60.0000 mg | PREFILLED_SYRINGE | INTRAMUSCULAR | Status: DC
  Administered 2021-06-30 – 2021-07-05 (×6): 60 mg via SUBCUTANEOUS
  Filled 2021-06-30 (×6): qty 0.6

## 2021-06-30 MED ORDER — GABAPENTIN 600 MG PO TABS
600.0000 mg | ORAL_TABLET | Freq: Three times a day (TID) | ORAL | Status: DC
  Administered 2021-06-30 – 2021-07-02 (×7): 600 mg via ORAL
  Filled 2021-06-30 (×7): qty 1

## 2021-06-30 MED ORDER — SODIUM CHLORIDE 0.9% FLUSH
3.0000 mL | Freq: Two times a day (BID) | INTRAVENOUS | Status: DC
  Administered 2021-06-30 – 2021-07-03 (×7): 3 mL via INTRAVENOUS
  Filled 2021-06-30: qty 3

## 2021-06-30 MED ORDER — SODIUM CHLORIDE 0.9 % IV SOLN: 250.0000 mL | INTRAVENOUS | Status: DC | PRN

## 2021-06-30 MED ORDER — QUETIAPINE FUMARATE ER 50 MG PO TB24
150.0000 mg | ORAL_TABLET | Freq: Every day | ORAL | Status: DC
  Administered 2021-06-30 – 2021-07-04 (×5): 150 mg via ORAL
  Filled 2021-06-30 (×7): qty 3

## 2021-06-30 MED ORDER — ACETAMINOPHEN 500 MG PO TABS
1000.0000 mg | ORAL_TABLET | Freq: Three times a day (TID) | ORAL | Status: DC | PRN
  Administered 2021-06-30: 1000 mg via ORAL
  Filled 2021-06-30: qty 2

## 2021-06-30 MED ORDER — LISINOPRIL 10 MG PO TABS: 10.0000 mg | ORAL_TABLET | Freq: Every day | ORAL | Status: DC

## 2021-06-30 MED ORDER — MOMETASONE FURO-FORMOTEROL FUM 200-5 MCG/ACT IN AERO
2.0000 | INHALATION_SPRAY | Freq: Two times a day (BID) | RESPIRATORY_TRACT | Status: DC
  Administered 2021-06-30 – 2021-07-05 (×10): 2 via RESPIRATORY_TRACT
  Filled 2021-06-30 (×2): qty 8.8

## 2021-06-30 NOTE — ED Notes (Signed)
Informed RN bed assigned 

## 2021-06-30 NOTE — Assessment & Plan Note (Signed)
Patient has a BMI of 47.89 kg/m2 ?Complicates overall prognosis and care ?

## 2021-06-30 NOTE — ED Notes (Signed)
IV access attempted x 1 able to get labs, not sustainable for IV ?

## 2021-06-30 NOTE — Assessment & Plan Note (Signed)
Stable ?Continue bupropion, Seroquel and Trintellix ?

## 2021-06-30 NOTE — ED Notes (Signed)
Pt with MRI at this time ?

## 2021-06-30 NOTE — ED Notes (Signed)
[facility] not working, will send covid swab with chart label. ?

## 2021-06-30 NOTE — H&P (Signed)
History and Physical    Patient: [name]:[id number] DOB: 1950/12/08 DOA: 06/30/2021 DOS: the patient was seen and examined on 06/30/2021 PCP: [name] Ruths, MD  Patient coming from: Home  Chief Complaint:  Chief Complaint  Patient presents with   Fall   Urinary Catheter Replacement    HPI: [name] is a 71 y.o. female with medical history significant for multiple sclerosis, depression, hypertension, chronic indwelling Foley catheter, COPD, follicular lymphoma who presents to the ER for evaluation following a fall at home. She states that she has had multiple falls recently with the last fall being 1 day prior to her admission while in the bathroom.  She is mostly wheelchair-bound but states that she is able to ambulate short distances with her rolling walker.  She presented to the ER with complaints of pain involving her left hip and leg. She denied feeling dizzy or lightheaded prior to the fall and did not hit her head.  She denies any loss of consciousness.  She denies having any fever, no chills, no cough, no nausea, no vomiting, no abdominal pain, no changes in her bowel habits. Her Foley catheter had not been exchanged for over 6 weeks because she said she lost her home health service due to change in insurance. She is noted to have mild pyuria but is afebrile and has no leukocytosis. She will be admitted to the hospital for further evaluation.  Review of Systems: As mentioned in the history of present illness. All other systems reviewed and are negative. Past Medical History:  Diagnosis Date   Abdominal aortic atherosclerosis ([place]) 11/11/2016   ADHD    Anxiety    COPD (chronic obstructive pulmonary disease) (HCC)    Depression    major depressive   Dyspnea    doe   Edema    left leg   Follicular lymphoma (HCC)    B Cell   Follicular lymphoma grade II (HCC)    Hypertension    Hypotension    idiopathic   Kyphoscoliosis and scoliosis 11/26/2011   Morbid  obesity ([place]) 01/05/2016   Multiple sclerosis (HCC)    Multiple sclerosis ([place])    1980's   Neuromuscular disorder ([place])    Obstructive and reflux uropathy    foley   Pain    atypical facial   Peripheral vascular disease of lower extremity with ulceration ([place]) 11/08/2015   Skin ulcer ([place]) 11/08/2015   Weakness    generalized. has MS   Past Surgical History:  Procedure Laterality Date   BACK SURGERY N/A 2002   CYST EXCISION     lower back   INGUINAL LYMPH NODE BIOPSY Left 07/04/2016   Procedure: INGUINAL LYMPH NODE BIOPSY;  Surgeon: Christene Lye, MD;  Location: [facility];  Service: General;  Laterality: Left;   ORIF FEMUR FRACTURE Left 02/04/2020   Procedure: OPEN REDUCTION INTERNAL FIXATION (ORIF) DISTAL FEMUR FRACTURE;  Surgeon: Shona Needles, MD;  Location: [facility];  Service: Orthopedics;  Laterality: Left;   PORTACATH PLACEMENT N/A 07/22/2016   Procedure: INSERTION PORT-A-CATH;  Surgeon: Christene Lye, MD;  Location: [facility];  Service: General;  Laterality: N/A;   TONSILLECTOMY AND ADENOIDECTOMY     TUBAL LIGATION     Social History:  reports that she quit smoking about 5 years ago. Her smoking use included cigarettes. She started smoking about 26 years ago. She has a 20.00 pack-year smoking history. She has never used smokeless tobacco. She reports current drug use.  Drug: Marijuana. She reports that she does not drink alcohol.  No Known Allergies  Family History  Problem Relation Age of Onset   COPD Mother    Diabetes Mother    Heart failure Mother    Alcohol abuse Father    Kidney disease Father    Kidney failure Father    Arthritis Sister    CAD Maternal Grandmother    Stroke Maternal Grandfather    Arthritis Sister    Mental illness Sister    Arthritis Brother     Prior to Admission medications   Medication Sig Start Date End Date Taking? Authorizing Provider  acetaminophen (TYLENOL) 500 MG tablet Take 1,000 mg by mouth every 8 (eight) hours as  needed for moderate pain.   Yes [provider]  amLODipine (NORVASC) 5 MG tablet Take 5 mg by mouth daily. 09/17/19  Yes [provider]  atorvastatin (LIPITOR) 10 MG tablet Take 10 mg by mouth at bedtime.  01/14/19  Yes [provider]  buPROPion (WELLBUTRIN XL) 300 MG 24 hr tablet Take 300 mg by mouth daily. 03/21/21  Yes [provider]  clonazePAM (KLONOPIN) 0.5 MG tablet Take 1 tablet (0.5 mg total) by mouth 2 (two) times daily. 02/10/20  Yes Bonnielee Haff, MD  cyanocobalamin 1000 MCG tablet Take 1,000 mcg by mouth daily.   Yes [provider]  docusate sodium (COLACE) 100 MG capsule Take 1 capsule (100 mg total) by mouth 2 (two) times daily as needed. 11/24/19  Yes Enzo Bi, MD  DULoxetine (CYMBALTA) 60 MG capsule Take 1 capsule (60 mg total) by mouth every morning. Patient taking differently: Take 60 mg by mouth daily. 10/14/16  Yes Vaughan Basta, MD  gabapentin (NEURONTIN) 600 MG tablet Take 600 mg by mouth 3 (three) times daily.   Yes [provider]  hydrALAZINE (APRESOLINE) 50 MG tablet Take 50 mg by mouth in the morning and at bedtime.   Yes [provider]  lisinopril (ZESTRIL) 20 MG tablet Take 0.5 tablets (10 mg total) by mouth daily. 03/29/21  Yes Dahal, Marlowe Aschoff, MD  Multiple Vitamin (MULTIVITAMIN WITH MINERALS) TABS tablet Take 1 tablet by mouth daily.   Yes [provider]  MYRBETRIQ 50 MG TB24 tablet TAKE ONE TABLET BY MOUTH ONCE DAILY 04/19/20  Yes Stoioff, Ronda Fairly, MD  QUEtiapine Fumarate (SEROQUEL XR) 150 MG 24 hr tablet Take 150 mg by mouth at bedtime.    Yes [provider]  Teriflunomide 14 MG TABS Take 14 mg by mouth daily. 04/09/21 07/08/21 Yes [provider]  torsemide (DEMADEX) 20 MG tablet Take 20 mg by mouth daily. 10/03/20  Yes [provider]  traZODone (DESYREL) 50 MG tablet Take 50 mg by mouth at bedtime. 03/21/21  Yes [provider]  vortioxetine HBr  (TRINTELLIX) 5 MG TABS tablet Take 5 mg by mouth daily.   Yes [provider]  budesonide-formoterol (SYMBICORT) 160-4.5 MCG/ACT inhaler Inhale 2 puffs into the lungs 2 (two) times daily.     [provider]  magnesium oxide (MAG-OX) 400 MG tablet Take 400 mg by mouth daily.    [provider]  polyethylene glycol (MIRALAX / GLYCOLAX) 17 g packet Take 17 g by mouth daily as needed for mild constipation. 03/29/21   Terrilee Croak, MD  saccharomyces boulardii (FLORASTOR) 250 MG capsule Take 1 capsule (250 mg total) by mouth 2 (two) times daily. 03/29/21   Terrilee Croak, MD    Physical Exam: Vitals:   06/30/21 0107  06/30/21 0529 06/30/21 0730 06/30/21 0900  BP:  (!) 106/46 117/86   Pulse:  61 62 62  Resp:  18 18   Temp: 97.8 F (36.6 C) 97.9 F (36.6 C)    TempSrc: Oral Oral    SpO2:  97% 97% 98%  Weight:      Height:       Physical Exam Constitutional:      Appearance: She is obese.  HENT:     Head: Normocephalic and atraumatic.     Nose: Nose normal.     Mouth/Throat:     Mouth: Mucous membranes are moist.  Eyes:     Pupils: Pupils are equal, round, and reactive to light.  Cardiovascular:     Rate and Rhythm: Normal rate and regular rhythm.  Pulmonary:     Effort: Pulmonary effort is normal.     Breath sounds: Normal breath sounds.  Abdominal:     General: Bowel sounds are normal.     Palpations: Abdomen is soft.     Comments: Central adiposity  Musculoskeletal:     Cervical back: Normal range of motion and neck supple.     Comments: Bilateral lower extremity weakness.  Strength 3/5 in both lower extremities  Skin:    General: Skin is warm and dry.  Neurological:     General: No focal deficit present.     Mental Status: She is alert and oriented to person, place, and time.  Psychiatric:        Mood and Affect: Mood normal.        Behavior: Behavior normal.     Data Reviewed: Relevant notes from primary care and specialist visits, past  discharge summaries as available in EHR, including Care Everywhere. Prior diagnostic testing as pertinent to current admission diagnoses Updated medications and problem lists for reconciliation ED course, including vitals, labs, imaging, treatment and response to treatment Triage notes, nursing and pharmacy notes and ED provider's notes Notable results as noted in HPI Urine analysis shows pyuria Glucose 107, BUN 24, creatinine 1.51, hemoglobin 10.5, hematocrit 34.2 Right hip x-ray does not show any evidence of an acute fracture Right knee x-ray shows no acute fracture or subluxation. Tricompartmental osteoarthritis and chondrocalcinosis. Minimal knee joint effusion. CT scan of head and cervical spine show chronic ischemic microangiopathy and generalized atrophy without acute intracranial abnormality. No acute fracture or static subluxation of the cervical spine. Twelve-lead EKG reviewed by me shows sinus rhythm with first-degree AV block There are no new results to review at this time.  Assessment and Plan: * Fall Patient with a history of multiple sclerosis who presents to the ER for evaluation after she had multiple falls at home. She complains of bilateral lower extremity weakness but more involving the right lower extremity. At baseline she is wheelchair-bound but states she is able to ambulate short distances with a rolling walker. We will place patient on fall precautions We will request PT evaluation  Foley catheter in place on admission Patient has a history of neurogenic bladder secondary to known MS She has a chronic indwelling Foley catheter in place that was last exchanged 6 weeks ago Foley catheter was replaced in the ER Continued Myrbetriq  UTI (urinary tract infection) Patient has pyuria and has a chronic indwelling Foley catheter in place We will place patient empirically on Rocephin 1 g IV daily until urine culture results become  available  Depression Stable Continue bupropion, Seroquel and Trintellix  Obesity, Class III, BMI 40-49.9 (morbid obesity) (  [place]) Patient has a BMI of 07.37 kg/m2 Complicates overall prognosis and care  CKD (chronic kidney disease) stage 3, GFR 30-59 ml/min (HCC) Patient has a history of stage III chronic kidney disease Renal function appears stable at this time We will monitor closely during this hospitalization  Multiple sclerosis ([place]) Continue teriflunomide We will request neurology consult due to concerns for possible acute flare of her known chronic disease  Essential hypertension Hold lisinopril and hydralazine since patient is normotensive Continue amlodipine       Advance Care Planning:   Code Status: Full Code   Consults: Physical therapy  Family Communication: Greater than 50% of time was spent discussing patient's condition and plan of care with her at the bedside.  All questions and concerns have been addressed.  She verbalizes understanding and agrees with the plan.  Severity of Illness: The appropriate patient status for this patient is INPATIENT. Inpatient status is judged to be reasonable and necessary in order to provide the required intensity of service to ensure the patient's safety. The patient's presenting symptoms, physical exam findings, and initial radiographic and laboratory data in the context of their chronic comorbidities is felt to place them at high risk for further clinical deterioration. Furthermore, it is not anticipated that the patient will be medically stable for discharge from the hospital within 2 midnights of admission.   * I certify that at the point of admission it is my clinical judgment that the patient will require inpatient hospital care spanning beyond 2 midnights from the point of admission due to high intensity of service, high risk for further deterioration and high frequency of surveillance required.*  Author: Collier Bullock,  MD 06/30/2021 10:41 AM  For on call review [email or web address].

## 2021-06-30 NOTE — Assessment & Plan Note (Addendum)
-   Keep Hold lisinopril and hydralazine since patient is normotensive ?-Continue amlodipine ?

## 2021-06-30 NOTE — ED Notes (Signed)
Hospitalist at bedside 

## 2021-06-30 NOTE — ED Notes (Addendum)
Pt eating dinner, transportation requested ?

## 2021-06-30 NOTE — Assessment & Plan Note (Addendum)
Repeat MRI brain and C-spine was negative for any new lesions.  Neurology signed off. ?-Continue teriflunomide ?-Outpatient follow-up with neurology for further recommendations ? ?

## 2021-06-30 NOTE — ED Provider Notes (Signed)
[facility] Provider Note    Event Date/Time   First MD Initiated Contact with Patient 06/30/21 [phone]     (approximate)   History   Fall and Urinary Catheter Replacement   HPI  [name] is a 71 y.o. female with history of multiple sclerosis, morbid obesity, peripheral vascular disease, COPD who presents to the emergency department EMS after she fell.  Patient is unfortunately a very poor historian.  She tells me that her right leg has felt very weak and been painful and she felt like it gave out on her.  She is unable to tell me how long her right leg was weak.  She is mostly wheelchair-bound but states she has been able to ambulate using a walker.  She does not think she hit her head or lost consciousness.  She is not on blood thinners.  She denies any preceding dizziness, chest pain, shortness of breath.  No recent fevers but has had cough.  No vomiting or diarrhea.  Has a chronic indwelling Foley catheter that she states has been present for years and was last exchanged 6 weeks ago.  She is requesting that it be exchanged today.   History provided by patient and EMS.    Past Medical History:  Diagnosis Date   Abdominal aortic atherosclerosis ([place]) 11/11/2016   ADHD    Anxiety    COPD (chronic obstructive pulmonary disease) (HCC)    Depression    major depressive   Dyspnea    doe   Edema    left leg   Follicular lymphoma (HCC)    B Cell   Follicular lymphoma grade II (HCC)    Hypertension    Hypotension    idiopathic   Kyphoscoliosis and scoliosis 11/26/2011   Morbid obesity ([place]) 01/05/2016   Multiple sclerosis (HCC)    Multiple sclerosis ([place])    1980's   Neuromuscular disorder ([place])    Obstructive and reflux uropathy    foley   Pain    atypical facial   Peripheral vascular disease of lower extremity with ulceration ([place]) 11/08/2015   Skin ulcer ([place]) 11/08/2015   Weakness    generalized. has MS    Past Surgical History:  Procedure  Laterality Date   BACK SURGERY N/A 2002   CYST EXCISION     lower back   INGUINAL LYMPH NODE BIOPSY Left 07/04/2016   Procedure: INGUINAL LYMPH NODE BIOPSY;  Surgeon: Christene Lye, MD;  Location: [facility];  Service: General;  Laterality: Left;   ORIF FEMUR FRACTURE Left 02/04/2020   Procedure: OPEN REDUCTION INTERNAL FIXATION (ORIF) DISTAL FEMUR FRACTURE;  Surgeon: Shona Needles, MD;  Location: [facility];  Service: Orthopedics;  Laterality: Left;   PORTACATH PLACEMENT N/A 07/22/2016   Procedure: INSERTION PORT-A-CATH;  Surgeon: Christene Lye, MD;  Location: [facility];  Service: General;  Laterality: N/A;   TONSILLECTOMY AND ADENOIDECTOMY     TUBAL LIGATION      MEDICATIONS:  Prior to Admission medications   Medication Sig Start Date End Date Taking? Authorizing Provider  acetaminophen (TYLENOL) 500 MG tablet Take 1,000 mg by mouth every 8 (eight) hours as needed for moderate pain.    [provider]  amLODipine (NORVASC) 5 MG tablet Take 5 mg by mouth daily. 09/17/19   [provider]  atorvastatin (LIPITOR) 10 MG tablet Take 10 mg by mouth at bedtime.  01/14/19   [provider]  budesonide-formoterol (SYMBICORT) 160-4.5 MCG/ACT inhaler Inhale  2 puffs into the lungs 2 (two) times daily.     [provider]  buPROPion (WELLBUTRIN XL) 300 MG 24 hr tablet Take 300 mg by mouth daily. 03/21/21   [provider]  clonazePAM (KLONOPIN) 0.5 MG tablet Take 1 tablet (0.5 mg total) by mouth 2 (two) times daily. 02/10/20   Bonnielee Haff, MD  cyanocobalamin 1000 MCG tablet Take 1,000 mcg by mouth daily.    [provider]  docusate sodium (COLACE) 100 MG capsule Take 1 capsule (100 mg total) by mouth 2 (two) times daily as needed. 11/24/19   Enzo Bi, MD  DULoxetine (CYMBALTA) 60 MG capsule Take 1 capsule (60 mg total) by mouth every morning. Patient taking differently: Take 60 mg by mouth daily. 10/14/16   Vaughan Basta, MD   gabapentin (NEURONTIN) 600 MG tablet Take 600 mg by mouth 3 (three) times daily.    [provider]  hydrALAZINE (APRESOLINE) 50 MG tablet Take 50 mg by mouth in the morning and at bedtime.    [provider]  lisinopril (ZESTRIL) 20 MG tablet Take 0.5 tablets (10 mg total) by mouth daily. 03/29/21   Terrilee Croak, MD  magnesium oxide (MAG-OX) 400 MG tablet Take 400 mg by mouth daily.    [provider]  Multiple Vitamin (MULTIVITAMIN WITH MINERALS) TABS tablet Take 1 tablet by mouth daily.    [provider]  MYRBETRIQ 50 MG TB24 tablet TAKE ONE TABLET BY MOUTH ONCE DAILY 04/19/20   Stoioff, Ronda Fairly, MD  polyethylene glycol (MIRALAX / GLYCOLAX) 17 g packet Take 17 g by mouth daily as needed for mild constipation. 03/29/21   Terrilee Croak, MD  QUEtiapine Fumarate (SEROQUEL XR) 150 MG 24 hr tablet Take 150 mg by mouth at bedtime.     [provider]  saccharomyces boulardii (FLORASTOR) 250 MG capsule Take 1 capsule (250 mg total) by mouth 2 (two) times daily. 03/29/21   Terrilee Croak, MD  torsemide (DEMADEX) 20 MG tablet Take 20 mg by mouth daily. 10/03/20   [provider]  traZODone (DESYREL) 50 MG tablet Take 50 mg by mouth at bedtime. 03/21/21   [provider]  vortioxetine HBr (TRINTELLIX) 5 MG TABS tablet Take 5 mg by mouth daily.    [provider]    Physical Exam   Triage Vital Signs: ED Triage Vitals  Enc Vitals Group     BP 06/30/21 0106 (!) 109/54     Pulse Rate 06/30/21 0106 65     Resp 06/30/21 0106 18     Temp 06/30/21 0107 97.8 F (36.6 C)     Temp Source 06/30/21 0107 Oral     SpO2 06/30/21 0106 98 %     Weight 06/30/21 0058 279 lb (126.6 kg)     Height 06/30/21 0058 '5\' 4"'$  (1.626 m)     Head Circumference --      Peak Flow --      Pain Score 06/30/21 0057 8     Pain Loc --      Pain Edu? --      Excl. in [place]? --     Most recent vital signs: Vitals:   06/30/21 0107 06/30/21 0529  BP:  (!) 106/46   Pulse:  61  Resp:  18  Temp: 97.8 F (36.6 C) 97.9 F (36.6 C)  SpO2:  97%     CONSTITUTIONAL: Alert and oriented and responds appropriately to questions.  Morbidly obese, chronically ill-appearing, extremely poor historian  HEAD: Normocephalic; atraumatic EYES: Conjunctivae clear, PERRL, EOMI ENT: normal nose; no rhinorrhea; moist mucous membranes; pharynx without lesions noted; no dental injury; no septal hematoma, no epistaxis; no facial deformity or bony tenderness, poor dentition NECK: Supple, no midline spinal tenderness, step-off or deformity; trachea midline CARD: RRR; S1 and S2 appreciated; no murmurs, no clicks, no rubs, no gallops RESP: Normal chest excursion without splinting or tachypnea; breath sounds clear and equal bilaterally; no wheezes, no rhonchi, no rales; no hypoxia or respiratory distress CHEST:  chest wall stable, no crepitus or ecchymosis or deformity, nontender to palpation; no flail chest ABD/GI: Normal bowel sounds; non-distended; soft, non-tender, no rebound, no guarding; no ecchymosis or other lesions noted PELVIS:  stable, nontender to palpation BACK:  The back appears normal; no midline spinal tenderness, step-off or deformity EXT: Has edema in bilateral lower extremities that is pitting with some redness to her anterior shins but no significant warmth.  Her extremities are warm and well-perfused.  No deformity or bony tenderness of the lower extremities.  Compartments in the lower extremities are soft.  No joint effusions noted.   SKIN: Normal color for age and race; warm NEURO: No facial asymmetry, normal speech, moving all extremities equally, reports normal sensation diffusely, difficult to test strength due to patient's poor cooperation but she is able to lift both of her legs off the bed against gravity with no drift.  ED Results / Procedures / Treatments   LABS: (all labs ordered are listed, but only abnormal results are displayed) Labs Reviewed   CBC - Abnormal; Notable for the following components:      Result Value   Hemoglobin 10.5 (*)    HCT 34.2 (*)    All other components within normal limits  URINALYSIS, ROUTINE W REFLEX MICROSCOPIC - Abnormal; Notable for the following components:   Color, Urine YELLOW (*)    APPearance CLEAR (*)    Hgb urine dipstick SMALL (*)    Leukocytes,Ua SMALL (*)    All other components within normal limits  COMPREHENSIVE METABOLIC PANEL - Abnormal; Notable for the following components:   Glucose, Bld 107 (*)    BUN 24 (*)    Creatinine, Ser 1.51 (*)    GFR, Estimated 37 (*)    All other components within normal limits  URINE CULTURE  RESP PANEL BY RT-PCR (FLU A&B, COVID) [id number]  LACTIC ACID, PLASMA     EKG:  EKG Interpretation  Date/Time:  Saturday June 30 2021 02:26:19 EST Ventricular Rate:  60 PR Interval:  232 QRS Duration: 98 QT Interval:  428 QTC Calculation: 428 R Axis:   223 Text Interpretation: Suspect arm lead reversal, interpretation assumes no reversal Sinus rhythm with 1st degree A-V block Lateral infarct (cited on or before 23-Mar-2021) Abnormal ECG When compared with ECG of 23-Mar-2021 01:17, PR interval has increased Questionable change in QRS duration Questionable change in initial forces of Anterolateral leads Confirmed by Pryor Curia [id number]) on 06/30/2021 7:34:10 AM          RADIOLOGY: My personal review and interpretation of imaging: CT head and cervical spine show no acute traumatic injury.  X-ray of the right hip and right knee show no acute traumatic injury.  I have personally reviewed all radiology reports. CT HEAD WO CONTRAST (5MM)  Result Date: 06/30/2021 CLINICAL DATA:  Fall EXAM: CT HEAD WITHOUT CONTRAST CT CERVICAL SPINE WITHOUT CONTRAST TECHNIQUE: Multidetector CT imaging of the head and cervical spine was performed following the standard protocol without intravenous  contrast. Multiplanar CT image reconstructions of the cervical spine were also  generated. RADIATION DOSE REDUCTION: This exam was performed according to the departmental dose-optimization program which includes automated exposure control, adjustment of the mA and/or kV according to patient size and/or use of iterative reconstruction technique. COMPARISON:  None. FINDINGS: CT HEAD FINDINGS Brain: There is no mass, hemorrhage or extra-axial collection. There is generalized atrophy without lobar predilection. There is hypoattenuation of the periventricular white matter, most commonly indicating chronic ischemic microangiopathy. Vascular: No abnormal hyperdensity of the major intracranial arteries or dural venous sinuses. No intracranial atherosclerosis. Skull: The visualized skull base, calvarium and extracranial soft tissues are normal. Sinuses/Orbits: No fluid levels or advanced mucosal thickening of the visualized paranasal sinuses. No mastoid or middle ear effusion. The orbits are normal. CT CERVICAL SPINE FINDINGS Alignment: No static subluxation. Facets are aligned. Occipital condyles are normally positioned. Skull base and vertebrae: No acute fracture. Soft tissues and spinal canal: No prevertebral fluid or swelling. No visible canal hematoma. Disc levels: No advanced spinal canal or neural foraminal stenosis. Upper chest: No pneumothorax, pulmonary nodule or pleural effusion. Other: Normal visualized paraspinal cervical soft tissues. IMPRESSION: 1. Chronic ischemic microangiopathy and generalized atrophy without acute intracranial abnormality. 2. No acute fracture or static subluxation of the cervical spine. Electronically Signed   By: Ulyses Jarred M.D.   On: 06/30/2021 02:07   CT Cervical Spine Wo Contrast  Result Date: 06/30/2021 CLINICAL DATA:  Fall EXAM: CT HEAD WITHOUT CONTRAST CT CERVICAL SPINE WITHOUT CONTRAST TECHNIQUE: Multidetector CT imaging of the head and cervical spine was performed following the standard protocol without intravenous contrast. Multiplanar CT image  reconstructions of the cervical spine were also generated. RADIATION DOSE REDUCTION: This exam was performed according to the departmental dose-optimization program which includes automated exposure control, adjustment of the mA and/or kV according to patient size and/or use of iterative reconstruction technique. COMPARISON:  None. FINDINGS: CT HEAD FINDINGS Brain: There is no mass, hemorrhage or extra-axial collection. There is generalized atrophy without lobar predilection. There is hypoattenuation of the periventricular white matter, most commonly indicating chronic ischemic microangiopathy. Vascular: No abnormal hyperdensity of the major intracranial arteries or dural venous sinuses. No intracranial atherosclerosis. Skull: The visualized skull base, calvarium and extracranial soft tissues are normal. Sinuses/Orbits: No fluid levels or advanced mucosal thickening of the visualized paranasal sinuses. No mastoid or middle ear effusion. The orbits are normal. CT CERVICAL SPINE FINDINGS Alignment: No static subluxation. Facets are aligned. Occipital condyles are normally positioned. Skull base and vertebrae: No acute fracture. Soft tissues and spinal canal: No prevertebral fluid or swelling. No visible canal hematoma. Disc levels: No advanced spinal canal or neural foraminal stenosis. Upper chest: No pneumothorax, pulmonary nodule or pleural effusion. Other: Normal visualized paraspinal cervical soft tissues. IMPRESSION: 1. Chronic ischemic microangiopathy and generalized atrophy without acute intracranial abnormality. 2. No acute fracture or static subluxation of the cervical spine. Electronically Signed   By: Ulyses Jarred M.D.   On: 06/30/2021 02:07   DG Knee Complete 4 Views Right  Result Date: 06/30/2021 CLINICAL DATA:  Fall today with right knee pain. EXAM: RIGHT KNEE - COMPLETE 4+ VIEW COMPARISON:  None. FINDINGS: The bones are subjectively under mineralized. No acute fracture or dislocation.  Tricompartmental osteoarthritis most prominently affecting the medial tibiofemoral compartment. There is chondrocalcinosis. Minimal knee joint effusion. Generalized soft tissue edema. IMPRESSION: 1. No acute fracture or subluxation. 2. Tricompartmental osteoarthritis and chondrocalcinosis. 3. Minimal knee joint effusion. Electronically Signed   By: Threasa Beards  [place] M.D.   On: 06/30/2021 01:41   DG Hip Unilat  With Pelvis 2-3 Views Right  Result Date: 06/30/2021 CLINICAL DATA:  Post fall with hip pain. Pain of right knee and hip. EXAM: DG HIP (WITH OR WITHOUT PELVIS) 2-3V RIGHT COMPARISON:  None. FINDINGS: Bones are under mineralized. Soft tissue attenuation from habitus further limits assessment. Allowing for this, no fracture of the pelvis or right hip. Mild right hip degenerative change. Pubic rami are intact. Pubic symphysis and sacroiliac joints are congruent. IMPRESSION: No fracture of the pelvis or right hip. Electronically Signed   By: Keith Rake M.D.   On: 06/30/2021 01:40     PROCEDURES:  Critical Care performed: No   CRITICAL CARE Performed by: Cyril Mourning Karlis Cregg   Total critical care time: 0 minutes  Critical care time was exclusive of separately billable procedures and treating other patients.  Critical care was necessary to treat or prevent imminent or life-threatening deterioration.  Critical care was time spent personally by me on the following activities: development of treatment plan with patient and/or surrogate as well as nursing, discussions with consultants, evaluation of patient's response to treatment, examination of patient, obtaining history from patient or surrogate, ordering and performing treatments and interventions, ordering and review of laboratory studies, ordering and review of radiographic studies, pulse oximetry and re-evaluation of patient's condition.   Marland Kitchen1-3 Lead EKG Interpretation Performed by: Daylee Delahoz, Delice Bison, DO Authorized by: Fredrick Dray, Delice Bison, DO      Interpretation: normal     ECG rate:  65   ECG rate assessment: normal     Rhythm: sinus rhythm     Ectopy: none     Conduction: normal      IMPRESSION / MDM / ASSESSMENT AND PLAN / ED COURSE  I reviewed the triage vital signs and the nursing notes.  Patient here after a fall from her shower chair in the bathroom.  She feels like her right leg is weak but states it is also painful.  She does have a history of MS and a chronic indwelling Foley catheter.  The patient is on the cardiac monitor to evaluate for evidence of arrhythmia and/or significant heart rate changes.   DIFFERENTIAL DIAGNOSIS (includes but not limited to):   MS exacerbation, deconditioning, generalized weakness due to anemia, electrolyte derangement, UTI, dehydration; lower extremity fractures   PLAN: We will obtain CBC, BMP, EKG, urinalysis.  Will obtain CT of the head and cervical spine and x-rays of the right hip and knee.  She declines any pain medication at this time.   MEDICATIONS GIVEN IN ED: Medications  cefTRIAXone (ROCEPHIN) 1 g in sodium chloride 0.9 % 100 mL IVPB (has no administration in time range)     ED COURSE: Patient's labs show mild anemia which is actually improved compared to previous in November 2022.  She has chronic kidney disease which appears stable.  Electrolytes within normal limits.  Lactic is normal.  Her legs do show some redness but this may be chronic in nature as my suspicion for cellulitis is low given no fever, leukocytosis, normal lactic and no significant warmth to her legs.  Urine does appear to have some mild infection with small leukocytes and 11-20 white blood cells.  This was obtained as a new specimen after her Foley catheter was exchanged.  Culture is pending.  Will give Rocephin.  This could be contributing to her weakness.  I am also concerned about an MS flare and have recommended MRIs of  the brain, thoracic and lumbar spine.  CT of the head and cervical spine were obtained  and I have reviewed them as well as the radiologist and there is no acute traumatic injury.  Will discuss with hospitalist for admission for generalized weakness, UTI and possible MS flare.   CONSULTS:  Consulted and discussed patient's case with hospitalist, Dr. Francine Graven.  I have recommended admission and consulting physician agrees and will place admission orders.  Patient (and family if present) agree with this plan.   I reviewed all nursing notes, vitals, pertinent previous records.  All labs, EKGs, imaging ordered have been independently reviewed and interpreted by myself.    OUTSIDE RECORDS REVIEWED: Reviewed patient's last admission in December 2022.         FINAL CLINICAL IMPRESSION(S) / ED DIAGNOSES   Final diagnoses:  Fall, initial encounter  Right leg weakness  Acute UTI     Rx / DC Orders   ED Discharge Orders     None        Note:  This document was prepared using Dragon voice recognition software and may include unintentional dictation errors.   Khalib Fendley, Delice Bison, DO 06/30/21 [phone]

## 2021-06-30 NOTE — ED Triage Notes (Signed)
Per ems pt from home with indwelling catheter. Per ems pt with fall earlier today at home. Pt now complains of left sided leg, arm, hip pain per ems. Vss per ems. Per ems pt would also like her urinary catheter replaced.  ?

## 2021-06-30 NOTE — Progress Notes (Signed)
PHARMACIST - PHYSICIAN COMMUNICATION ? ?CONCERNING:  Enoxaparin (Lovenox) for DVT Prophylaxis  ? ? ?RECOMMENDATION: ?Patient was prescribed enoxaprin '40mg'$  q24 hours for VTE prophylaxis.  ? Danley Danker Weights  ? 06/30/21 0058  ?Weight: 126.6 kg (279 lb)  ? ? ?Body mass index is 47.89 kg/m?. ? ?Estimated Creatinine Clearance: 45.7 mL/min (A) (by C-G formula based on SCr of 1.51 mg/dL (H)). ? ? ?Based on [place] patient is candidate for enoxaparin 0.'5mg'$ /kg TBW SQ every 24 hours based on BMI being >30. ? ?DESCRIPTION: ?Pharmacy has adjusted enoxaparin dose per [facility] policy. ? ?Patient is now receiving enoxaparin 60 mg every 24 hours  ? ? ?Wynelle Cleveland, PharmD ?Clinical Pharmacist  ?06/30/2021 ?11:15 AM ? ?

## 2021-06-30 NOTE — Assessment & Plan Note (Signed)
Patient has a history of neurogenic bladder secondary to known MS ?She has a chronic indwelling Foley catheter in place that was last exchanged 6 weeks ago ?Foley catheter was replaced in the ER ?Continued Myrbetriq ?

## 2021-06-30 NOTE — Assessment & Plan Note (Signed)
Patient has a history of stage III chronic kidney disease ?Renal function appears stable at this time ?We will monitor closely during this hospitalization ?

## 2021-06-30 NOTE — Assessment & Plan Note (Addendum)
Patient has pyuria and has a chronic indwelling Foley catheter in place ?Urine culture with multiple species.No urinary symptoms. ?-Stop ABx. ?

## 2021-06-30 NOTE — ED Notes (Signed)
Unable to pull urine from foley catheter, no port to connect syringe to to get a sterile sample. ?

## 2021-06-30 NOTE — ED Notes (Signed)
Attempted to get patient out of bed to ambulate. Pt was unable to support upper body to sit up. Full assistance with 2 people to get her to stand for a few seconds. Pt then quickly sat back down on the bed. Dr. Leonides Schanz updated on patient status.  ?

## 2021-06-30 NOTE — ED Triage Notes (Addendum)
Pt arrived via [facility] with reports of multiple falls recently, pt fell in the shower and was unable to make it over the ledge, pt states her feet were tangled pt c/o R leg pain  ? ?Pt states over 1 week ago she fell and hit her head on the baseboard. ? ?Pt c/o R hip pain as well as knee pain. ? ?Pt has urinary catheter in place, states she has MS with neurogenic bladder states she does not have a home health agency at this time and the catheter has been in place for over 30 days.  ?

## 2021-06-30 NOTE — ED Notes (Signed)
Overdue meds are not yet verified by pharmacy. 

## 2021-06-30 NOTE — ED Notes (Signed)
Pt to MRI

## 2021-06-30 NOTE — Assessment & Plan Note (Addendum)
Patient with a history of multiple sclerosis who presents to the ER for evaluation after she had multiple falls at home. ?She complains of bilateral lower extremity weakness but more involving the right lower extremity. ?At baseline she is wheelchair-bound but states she is able to ambulate short distances with a rolling walker. ?PT/OT is recommending SNF ?-TOC consult ?-Continue with fall precautions ?

## 2021-07-01 ENCOUNTER — Inpatient Hospital Stay: Payer: Medicare (Managed Care)

## 2021-07-01 DIAGNOSIS — W19XXXA Unspecified fall, initial encounter: Secondary | ICD-10-CM | POA: Diagnosis not present

## 2021-07-01 DIAGNOSIS — Z23 Encounter for immunization: Secondary | ICD-10-CM | POA: Diagnosis present

## 2021-07-01 DIAGNOSIS — Z823 Family history of stroke: Secondary | ICD-10-CM | POA: Diagnosis not present

## 2021-07-01 DIAGNOSIS — Z833 Family history of diabetes mellitus: Secondary | ICD-10-CM | POA: Diagnosis not present

## 2021-07-01 DIAGNOSIS — Z20822 Contact with and (suspected) exposure to covid-19: Secondary | ICD-10-CM | POA: Diagnosis present

## 2021-07-01 DIAGNOSIS — N1831 Chronic kidney disease, stage 3a: Secondary | ICD-10-CM | POA: Diagnosis present

## 2021-07-01 DIAGNOSIS — F419 Anxiety disorder, unspecified: Secondary | ICD-10-CM | POA: Diagnosis present

## 2021-07-01 DIAGNOSIS — R8281 Pyuria: Secondary | ICD-10-CM | POA: Diagnosis present

## 2021-07-01 DIAGNOSIS — Z7951 Long term (current) use of inhaled steroids: Secondary | ICD-10-CM | POA: Diagnosis not present

## 2021-07-01 DIAGNOSIS — Z993 Dependence on wheelchair: Secondary | ICD-10-CM | POA: Diagnosis not present

## 2021-07-01 DIAGNOSIS — Z8249 Family history of ischemic heart disease and other diseases of the circulatory system: Secondary | ICD-10-CM | POA: Diagnosis not present

## 2021-07-01 DIAGNOSIS — I10 Essential (primary) hypertension: Secondary | ICD-10-CM | POA: Diagnosis not present

## 2021-07-01 DIAGNOSIS — Z825 Family history of asthma and other chronic lower respiratory diseases: Secondary | ICD-10-CM | POA: Diagnosis not present

## 2021-07-01 DIAGNOSIS — J449 Chronic obstructive pulmonary disease, unspecified: Secondary | ICD-10-CM | POA: Diagnosis present

## 2021-07-01 DIAGNOSIS — R296 Repeated falls: Secondary | ICD-10-CM | POA: Diagnosis present

## 2021-07-01 DIAGNOSIS — G35 Multiple sclerosis: Secondary | ICD-10-CM | POA: Diagnosis present

## 2021-07-01 DIAGNOSIS — Z79899 Other long term (current) drug therapy: Secondary | ICD-10-CM | POA: Diagnosis not present

## 2021-07-01 DIAGNOSIS — I129 Hypertensive chronic kidney disease with stage 1 through stage 4 chronic kidney disease, or unspecified chronic kidney disease: Secondary | ICD-10-CM | POA: Diagnosis present

## 2021-07-01 DIAGNOSIS — Z841 Family history of disorders of kidney and ureter: Secondary | ICD-10-CM | POA: Diagnosis not present

## 2021-07-01 DIAGNOSIS — Z6841 Body Mass Index (BMI) 40.0 and over, adult: Secondary | ICD-10-CM | POA: Diagnosis not present

## 2021-07-01 DIAGNOSIS — N318 Other neuromuscular dysfunction of bladder: Secondary | ICD-10-CM | POA: Diagnosis present

## 2021-07-01 DIAGNOSIS — M21372 Foot drop, left foot: Secondary | ICD-10-CM | POA: Diagnosis present

## 2021-07-01 DIAGNOSIS — F32A Depression, unspecified: Secondary | ICD-10-CM | POA: Diagnosis present

## 2021-07-01 DIAGNOSIS — Z8261 Family history of arthritis: Secondary | ICD-10-CM | POA: Diagnosis not present

## 2021-07-01 DIAGNOSIS — N179 Acute kidney failure, unspecified: Secondary | ICD-10-CM | POA: Diagnosis present

## 2021-07-01 LAB — GLUCOSE, CAPILLARY: Glucose-Capillary: 115 mg/dL — ABNORMAL HIGH (ref 70–99)

## 2021-07-01 LAB — CBC
HCT: 31 % — ABNORMAL LOW (ref 36.0–46.0)
Hemoglobin: 10 g/dL — ABNORMAL LOW (ref 12.0–15.0)
MCH: 27.4 pg (ref 26.0–34.0)
MCHC: 32.3 g/dL (ref 30.0–36.0)
MCV: 84.9 fL (ref 80.0–100.0)
Platelets: 207 10*3/uL (ref 150–400)
RBC: 3.65 MIL/uL — ABNORMAL LOW (ref 3.87–5.11)
RDW: 14.7 % (ref 11.5–15.5)
WBC: 5.7 10*3/uL (ref 4.0–10.5)
nRBC: 0 % (ref 0.0–0.2)

## 2021-07-01 LAB — BASIC METABOLIC PANEL
Anion gap: 10 (ref 5–15)
BUN: 24 mg/dL — ABNORMAL HIGH (ref 8–23)
CO2: 24 mmol/L (ref 22–32)
Calcium: 8.8 mg/dL — ABNORMAL LOW (ref 8.9–10.3)
Chloride: 102 mmol/L (ref 98–111)
Creatinine, Ser: 1.51 mg/dL — ABNORMAL HIGH (ref 0.44–1.00)
GFR, Estimated: 37 mL/min — ABNORMAL LOW (ref >60)
Glucose, Bld: 90 mg/dL (ref 70–99)
Potassium: 4 mmol/L (ref 3.5–5.1)
Sodium: 136 mmol/L (ref 135–145)

## 2021-07-01 LAB — URINE CULTURE: Culture: 60000 — AB

## 2021-07-01 MED ORDER — SODIUM CHLORIDE 0.9 % IV SOLN
1.0000 g | INTRAVENOUS | Status: AC
  Administered 2021-07-02: 1 g via INTRAVENOUS
  Filled 2021-07-01: qty 10

## 2021-07-01 MED ORDER — GADOBUTROL 1 MMOL/ML IV SOLN
10.0000 mL | Freq: Once | INTRAVENOUS | Status: AC | PRN
  Administered 2021-07-01: 10 mL via INTRAVENOUS

## 2021-07-01 MED ORDER — CEFTRIAXONE SODIUM 1 G IJ SOLR: 1.0000 g | INTRAMUSCULAR | Status: DC

## 2021-07-01 MED ORDER — CHLORHEXIDINE GLUCONATE CLOTH 2 % EX PADS
6.0000 | MEDICATED_PAD | Freq: Every day | CUTANEOUS | Status: DC
  Administered 2021-07-01 – 2021-07-04 (×4): 6 via TOPICAL

## 2021-07-01 NOTE — Progress Notes (Addendum)
PROGRESS NOTE  [name] OAC:[id number] DOB: 28-Dec-1950 DOA: 06/30/2021 PCP: Kirk Ruths, MD  HPI/Recap of past 24 hours: [name] is a 71 y.o. female with medical history significant for multiple sclerosis, depression, hypertension, chronic indwelling Foley catheter, COPD, follicular lymphoma who presents to [facility] ED for evaluation following a fall at home.  Endorses she has had multiple falls recently with the last fall being 1 day prior to her admission while in the bathroom.  She is mostly wheelchair-bound but states that she is able to ambulate short distances with her rolling walker.  She presented to the ER with complaints of pain involving her left hip and leg.  Work-up revealed concern for possible MS flare.  Neurology consulted, ordered MRI brain with and without contrast and cervical spine MRI with and without contrast.  07/01/2021: Patient was seen and examined at bedside this morning.  She reports intermittent loose stools.  No abdominal pain or nausea.     Assessment/Plan: Principal Problem:   Fall Active Problems:   Essential hypertension   Multiple sclerosis (HCC)   CKD (chronic kidney disease) stage 3, GFR 30-59 ml/min (HCC)   Obesity, Class III, BMI 40-49.9 (morbid obesity) ([place])   Depression   UTI (urinary tract infection)   Foley catheter in place on admission  Fall, from bilateral lower extremity weakness. Patient with a history of multiple sclerosis who presents to the ER for evaluation after she had multiple falls at home. She complains of bilateral lower extremity weakness but more involving the right lower extremity. At baseline she is wheelchair-bound but states she is able to ambulate short distances with a rolling walker. Seen by neurology due to concern for possible MS flare. MRI brain, cervical spine with and without contrast are pending. PT OT to evaluate. TOC to assist with DC planning. Continue fall precautions.   Foley catheter in  place on admission Patient has a history of neurogenic bladder secondary to known MS She has a chronic indwelling Foley catheter in place that was last exchanged 6 weeks ago Foley catheter was replaced in the ER on 06/30/2021. Continued home Myrbetriq   Presumptive UTI (urinary tract infection) Patient has pyuria and has a chronic indwelling Foley catheter in place We will place patient empirically on Rocephin 1 g IV daily Continue to follow urine culture for ID and sensitivities, narrow down antibiotics. Afebrile with no leukocytosis  AKI on CKD 3A, suspect prerenal in the setting of dehydration from poor oral intake and intermittent loose stools. Baseline creatinine appears to be 1.03 with GFR 58 Presented with creatinine of 1.54 with GFR 37 Continue to evaluate orthostasis, dehydration and hypotension Monitor urine output with strict I's and O's Repeat renal panel in the morning.   Chronic anxiety/depression Stable Continue bupropion, Seroquel and Trintellix   Severe morbid obesity, Class III, BMI 40-49.9 (morbid obesity) ([place]) Patient has a BMI of 01.09 kg/m2 Complicates overall prognosis and care Recommend weight loss outpatient regular physical activity and healthy dieting.   Multiple sclerosis ([place]) Continue home teriflunomide Appreciate neurology's assistance.   Essential hypertension BPs are soft, continue to hold off home torsemide, lisinopril, Norvasc, and hydralazine. Continue to closely monitor vital signs.             Advance Care Planning:   Code Status: Full Code    Consults: Neurology.   Family Communication: None at bedside.   CODE STATUS: Full code.  Status is: Observation.  Patient will require at least 2 midnights  for further evaluation and treatment of present condition.           Objective: Vitals:   06/30/21 1729 06/30/21 2015 07/01/21 0558 07/01/21 0907  BP: 101/66 (!) 113/52 (!) 103/52 (!) 115/51  Pulse: (!) 52 71 (!) 58 68   Resp: '15 16 20 16  '$ Temp: 98 F (36.7 C) 98.3 F (36.8 C) 98 F (36.7 C) 98 F (36.7 C)  TempSrc: Oral     SpO2: 99% 96% 91% 91%  Weight:      Height:        Intake/Output Summary (Last 24 hours) at 07/01/2021 1128 Last data filed at 07/01/2021 0558 Gross per 24 hour  Intake --  Output 3800 ml  Net -3800 ml   Filed Weights   06/30/21 0058  Weight: 126.6 kg    Exam:  General: 71 y.o. year-old female well developed well nourished in no acute distress.  Alert and oriented x3. Cardiovascular: Regular rate and rhythm with no rubs or gallops.  No thyromegaly or JVD noted.   Respiratory: Clear to auscultation with no wheezes or rales. Good inspiratory effort. Abdomen: Soft nontender nondistended with normal bowel sounds x4 quadrants. Musculoskeletal: No lower extremity edema. 2/4 pulses in all 4 extremities. Skin: No ulcerative lesions noted or rashes.  Foley catheter in place. Psychiatry: Mood is appropriate for condition and setting   Data Reviewed: CBC: Recent Labs  Lab 06/30/21 0118 07/01/21 0503  WBC 8.5 5.7  HGB 10.5* 10.0*  HCT 34.2* 31.0*  MCV 87.0 84.9  PLT 211 371   Basic Metabolic Panel: Recent Labs  Lab 06/30/21 0118 07/01/21 0503  NA 135 136  K 3.9 4.0  CL 101 102  CO2 28 24  GLUCOSE 107* 90  BUN 24* 24*  CREATININE 1.51* 1.51*  CALCIUM 9.3 8.8*   GFR: Estimated Creatinine Clearance: 45.7 mL/min (A) (by C-G formula based on SCr of 1.51 mg/dL (H)). Liver Function Tests: Recent Labs  Lab 06/30/21 0118  AST 16  ALT 13  ALKPHOS 68  BILITOT 0.4  PROT 6.9  ALBUMIN 4.0   No results for input(s): LIPASE, AMYLASE in the last 168 hours. No results for input(s): AMMONIA in the last 168 hours. Coagulation Profile: No results for input(s): INR, PROTIME in the last 168 hours. Cardiac Enzymes: No results for input(s): CKTOTAL, CKMB, CKMBINDEX, TROPONINI in the last 168 hours. BNP (last 3 results) No results for input(s): PROBNP in the last 8760  hours. HbA1C: No results for input(s): HGBA1C in the last 72 hours. CBG: No results for input(s): GLUCAP in the last 168 hours. Lipid Profile: No results for input(s): CHOL, HDL, LDLCALC, TRIG, CHOLHDL, LDLDIRECT in the last 72 hours. Thyroid Function Tests: No results for input(s): TSH, T4TOTAL, FREET4, T3FREE, THYROIDAB in the last 72 hours. Anemia Panel: No results for input(s): VITAMINB12, FOLATE, FERRITIN, TIBC, IRON, RETICCTPCT in the last 72 hours. Urine analysis:    Component Value Date/Time   COLORURINE YELLOW (A) 06/30/2021 0651   APPEARANCEUR CLEAR (A) 06/30/2021 0651   APPEARANCEUR [place] 08/02/2013 0020   LABSPEC 1.006 06/30/2021 0651   LABSPEC 1.012 08/02/2013 0020   PHURINE 6.0 06/30/2021 0651   GLUCOSEU NEGATIVE 06/30/2021 0651   GLUCOSEU Negative 08/02/2013 0020   HGBUR SMALL (A) 06/30/2021 0651   BILIRUBINUR NEGATIVE 06/30/2021 0651   BILIRUBINUR Negative 08/02/2013 [place] NEGATIVE 06/30/2021 0651   PROTEINUR NEGATIVE 06/30/2021 0651   NITRITE NEGATIVE 06/30/2021 0651   LEUKOCYTESUR SMALL (A) 06/30/2021 0626  LEUKOCYTESUR 2+ 08/02/2013 0020   Sepsis Labs: '@LABRCNTIP'$ (procalcitonin:4,lacticidven:4)  ) Recent Results (from the past 240 hour(s))  Urine Culture     Status: None (Preliminary result)   Collection Time: 06/30/21  6:51 AM   Specimen: Urine, Catheterized  Result Value Ref Range Status   Specimen Description   Final    URINE, CATHETERIZED Performed at [facility], [address], [place], [place] [address]    Special Requests   Final    NONE Performed at [facility], [address], [place], [place] [address]    Culture   Final    CULTURE REINCUBATED FOR BETTER GROWTH Performed at [facility], [place] [address], [place], [place] [address]    Report Status PENDING  Incomplete  Resp Panel by RT-PCR (Flu A&B, Covid) Nasopharyngeal Swab     Status: None   Collection Time: 06/30/21  8:00 AM   Specimen:  Nasopharyngeal Swab; Nasopharyngeal(NP) swabs in vial transport medium  Result Value Ref Range Status   SARS Coronavirus 2 by RT PCR NEGATIVE NEGATIVE Final    Comment: (NOTE) SARS-CoV-2 target nucleic acids are NOT DETECTED.  The SARS-CoV-2 RNA is generally detectable in upper respiratory specimens during the acute phase of infection. The lowest concentration of SARS-CoV-2 viral copies this assay can detect is 138 copies/mL. A negative result does not preclude SARS-Cov-2 infection and should not be used as the sole basis for treatment or other patient management decisions. A negative result may occur with  improper specimen collection/handling, submission of specimen other than nasopharyngeal swab, presence of viral mutation(s) within the areas targeted by this assay, and inadequate number of viral copies(<138 copies/mL). A negative result must be combined with clinical observations, patient history, and epidemiological information. The expected result is Negative.  Fact Sheet for Patients:  EntrepreneurPulse.com.au  Fact Sheet for Healthcare Providers:  IncredibleEmployment.be  This test is no t yet approved or cleared by the [place] FDA and  has been authorized for detection and/or diagnosis of SARS-CoV-2 by FDA under an Emergency Use Authorization (EUA). This EUA will remain  in effect (meaning this test can be used) for the duration of the COVID-19 declaration under Section 564(b)(1) of the Act, 21 U.S.C.section 360bbb-3(b)(1), unless the authorization is terminated  or revoked sooner.       Influenza A by PCR NEGATIVE NEGATIVE Final   Influenza B by PCR NEGATIVE NEGATIVE Final    Comment: (NOTE) The Xpert Xpress SARS-CoV-2/FLU/RSV plus assay is intended as an aid in the diagnosis of influenza from Nasopharyngeal swab specimens and should not be used as a sole basis for treatment. Nasal washings and aspirates are unacceptable for  Xpert Xpress SARS-CoV-2/FLU/RSV testing.  Fact Sheet for Patients: EntrepreneurPulse.com.au  Fact Sheet for Healthcare Providers: IncredibleEmployment.be  This test is not yet approved or cleared by the [place] FDA and has been authorized for detection and/or diagnosis of SARS-CoV-2 by FDA under an Emergency Use Authorization (EUA). This EUA will remain in effect (meaning this test can be used) for the duration of the COVID-19 declaration under Section 564(b)(1) of the Act, 21 U.S.C. section 360bbb-3(b)(1), unless the authorization is terminated or revoked.  Performed at [facility], [address] [place], [place] [address]       Studies: MR Brain W and Wo Contrast  Result Date: 06/30/2021 CLINICAL DATA:  Multiple sclerosis.  Fall. EXAM: MRI HEAD WITHOUT AND WITH CONTRAST TECHNIQUE: Multiplanar, multiecho pulse sequences of the brain and surrounding structures were  obtained without and with intravenous contrast. CONTRAST:  53m GADAVIST GADOBUTROL 1 MMOL/ML IV SOLN COMPARISON:  Head CT 06/30/2021. Prior brain MRI examinations 01/27/2020 and earlier. FINDINGS: The examination is significantly motion degraded, limiting evaluation. Most notably, there is severe motion degradation of the sagittal T1 weighted sequence, moderate motion degradation of the sagittal T2 FLAIR sequence, moderate/severe motion degradation of the axial T2 TSE sequence, moderate motion degradation of the axial SWI sequence, moderate motion degradation of the axial T1 weighted sequence, moderate motion degradation of the coronal T2 TSE sequence, and moderate/severe motion degradation of the T1 weighted postcontrast sequences. Brain: Moderate generalized cerebral atrophy. Advanced patchy and confluent T2 FLAIR hyperintense signal abnormality within the cerebral white matter is similar as compared to the prior brain MRI of 01/27/2020. In addition to involvement of the  deep periventricular white matter, there are also juxtacortical lesions. Findings are compatible with the provided history of multiple sclerosis. No definitively new lesion is identified. Within the limitations of motion degradation, no pathologic intracranial enhancement is demonstrated to suggest active demyelination. Unchanged chronic microhemorrhage at the genu of the corpus callosum. Redemonstrated small chronic infarct within the left cerebellar hemisphere. A small chronic infarct within the right cerebellar hemisphere is new from the prior exam. There is no acute infarct. No evidence of an intracranial mass. No extra-axial fluid collection. No midline shift. Vascular: Maintained flow voids within the proximal large arterial vessels. Skull and upper cervical spine: No focal suspicious marrow lesion. Sinuses/Orbits: Visualized orbits show no acute finding. Minimal mucosal thickening within the bilateral ethmoid sinuses. Other: Left mastoid effusion. IMPRESSION: Significantly motion degraded examination, limiting evaluation. Unchanged extensive cerebral white matter disease consistent with the provided history of multiple sclerosis. Within described limitations, no pathologic intracranial enhancement is identified to suggest active demyelination. Redemonstrated small chronic infarct within the left cerebellar hemisphere. A small chronic infarct within the right cerebellar hemisphere is new from the prior MRI of 01/27/2020 Stable moderate generalized cerebral atrophy. Electronically Signed   By: KKellie SimmeringD.O.   On: 06/30/2021 12:16   MR THORACIC SPINE W WO CONTRAST  Result Date: 06/30/2021 CLINICAL DATA:  71year old female with multiple sclerosis. Follicular lymphoma. Multiple recent falls. Wheelchair bound. Pain radiating to the left hip and leg. EXAM: MRI THORACIC WITHOUT AND WITH CONTRAST TECHNIQUE: Multiplanar and multiecho pulse sequences of the thoracic spine were obtained without and with  intravenous contrast. CONTRAST:  146mGADAVIST GADOBUTROL 1 MMOL/ML IV SOLN COMPARISON:  Cervical spine MRI 11/09/2018. CT Chest, Abdomen, and Pelvis 03/16/2020. FINDINGS: Limited cervical spine imaging: Stable with mild chronic anterolisthesis of C7 on T1. Thoracic spine segmentation:  Appears to be normal. Alignment: Mildly exaggerated thoracic kyphosis, stable since 2021. No thoracic spondylolisthesis. Vertebrae: Moderate chronic T12 compression fracture appears stable since 2021, associated partial obliteration of the T11-T12 disc with interbody and bilateral facet ankylosis as seen by CT in 2021. Faint degenerative endplate marrow edema and enhancement associated with T9 inferior endplate Schmorl's node. No other marrow edema or acute osseous abnormality. Normal background bone marrow signal. Cord: Capacious spinal canal at most levels. Generalized decreased thoracic spinal cord volume. Scattered small areas of heterogeneous T2 and STIR hyperintensity (such as the ventral left hemicord at T6 series 22, image 18), compatible with multifocal chronic cord demyelination. No cord expansion. No abnormal intradural enhancement is evident. No dural thickening. Conus medullaris below T12, conus medullaris at T12-L1, see lumbar MRI today reported separately. Paraspinal and other soft tissues: Partially visible chronic postoperative changes to the paraspinal  soft tissues beginning at the thoracolumbar junction. Confluent chronic subcutaneous granulation tissue there. Otherwise negative. Disc levels: Capacious thoracic spinal canal with no age advanced thoracic spine degeneration above the T10-T11 level. T10-T11: Right paracentral disc protrusion and/or endplate spurring with mild to moderate facet hypertrophy. Trace degenerative facet joint fluid. No spinal stenosis, in part related to chronic cord atrophy. Moderate to severe left T10 foraminal stenosis. T11-T12: Interbody ankylosis on the left with mild endplate  spurring. Bilateral facet ankylosis better demonstrated by CT in 2021. Mild osseous left T11 foraminal stenosis. T12-L1:  Reported on the lumbar spine separately. IMPRESSION: 1. Evidence of chronic thoracic spinal cord demyelination and atrophy. No acute finding in the thoracic spine. 2. Chronic T12 compression fracture with partial T11-T12 ankylosis. Adjacent segment disease at T10-T11 with moderate to severe left T10 foraminal stenosis but no significant spinal stenosis. 3. No other age advanced thoracic spine degeneration. See also Lumbar spine MRI reported separately. Electronically Signed   By: Genevie Ann M.D.   On: 06/30/2021 12:19   MR Lumbar Spine W Wo Contrast  Result Date: 06/30/2021 CLINICAL DATA:  71 year old female with multiple sclerosis. Follicular lymphoma. Multiple recent falls. Wheelchair bound. Pain radiating to the left hip and leg. EXAM: MRI LUMBAR SPINE WITHOUT AND WITH CONTRAST TECHNIQUE: Multiplanar and multiecho pulse sequences of the lumbar spine were obtained without and with intravenous contrast. CONTRAST:  26m GADAVIST GADOBUTROL 1 MMOL/ML IV SOLN COMPARISON:  Thoracic spine MRI today reported separately. Lumbar MRI 06/05/2007. CT Abdomen and Pelvis 03/16/2020. FINDINGS: Segmentation: Normal, concordant with the thoracic spine numbering today. Alignment: Stable since 2021. Chronic levoconvex lumbar scoliosis and mildly exaggerated lumbar lordosis. Vertebrae: Chronic hardware susceptibility artifact L3 through S1. Chronic T12 compression fracture. Pronounced chronic endplate changes also at L1-L2 and L2-L3. No convincing marrow edema or acute osseous abnormality. Intact visible sacrum. Conus medullaris and cauda equina: Conus extends to the T12-L1 level. Grossly normal conus. Lower thoracic spinal cord atrophy and heterogeneity reported with the thoracic spine today. No abnormal intradural enhancement. No dural thickening identified. Paraspinal and other soft tissues: Chronic  postoperative changes to the lumbar paraspinal soft tissues. Visible abdominal viscera are stable since 2021. Disc levels: T12-L1: Chronic severe disc space loss in the setting of chronic T12 compression fracture and T11-T12 partial ankylosis. Trace T2 and STIR hyperintensity within the disc space but no endplate or paraspinal inflammation. Disc osteophyte complex and up to moderate posterior element hypertrophy with previous partial decompression. No significant spinal stenosis here at the conus. Chronic severe left T12 foraminal stenosis. L1-L2: Severe disc and endplate degeneration. Circumferential disc osteophyte complex with previous posterior decompression. Up to moderate residual posterior element hypertrophy. No significant spinal stenosis. Moderate to severe bilateral chronic L1 foraminal stenosis. L2-L3: Complete loss of the disc space. Bulky circumferential disc osteophyte complex. Previous posterior decompression. No significant spinal stenosis. Moderate to severe left and severe right L2 neural foraminal stenosis also appears to be chronic. L3-L4: Prior decompression and fusion. Some evidence of solid arthrodesis here in 2021. No spinal stenosis. Chronic osseous foraminal stenosis is mild on the left, at least moderate on the right. L4-L5: Prior decompression and fusion. Endplate and posterior element hypertrophy greater on the left. No spinal stenosis. Severe left L4 foraminal stenosis. L5-S1: Unilateral posterior fusion hardware here. Prior decompression. No spinal stenosis but severe left and mild right L5 foraminal stenosis which appear chronic. IMPRESSION: 1. No acute finding in the lumbar spine. Chronic T12 compression fracture, previous lumbar decompression and fusion, chronic severe  underlying lumbar degeneration. 2. No significant spinal stenosis. But widespread chronic moderate and severe neural foraminal stenosis T12 through L5, sparing only the L3 and right L5 nerve levels. 3. Conus  medullaris within normal limits. Thoracic spinal cord reported separately. Electronically Signed   By: Genevie Ann M.D.   On: 06/30/2021 12:29    Scheduled Meds:  amLODipine  5 mg Oral Daily   atorvastatin  10 mg Oral QHS   buPROPion  300 mg Oral Daily   Chlorhexidine Gluconate Cloth  6 each Topical Daily   clonazePAM  0.5 mg Oral BID   DULoxetine  60 mg Oral Daily   enoxaparin (LOVENOX) injection  60 mg Subcutaneous Q24H   gabapentin  600 mg Oral TID   mirabegron ER  50 mg Oral Daily   mometasone-formoterol  2 puff Inhalation BID   multivitamin with minerals  1 tablet Oral Daily   QUEtiapine Fumarate  150 mg Oral QHS   sodium chloride flush  3 mL Intravenous Q12H   Teriflunomide  14 mg Oral Daily   torsemide  20 mg Oral Daily   traZODone  50 mg Oral QHS   vortioxetine HBr  5 mg Oral Daily    Continuous Infusions:  sodium chloride       LOS: 0 days     Kayleen Memos, MD [facility] Pager [phone]  If 7PM-7AM, please contact night-coverage [email or web address] Password [facility] 07/01/2021, 11:28 AM

## 2021-07-01 NOTE — Plan of Care (Signed)
  Problem: Education: Goal: Knowledge of General Education information will improve Description Including pain rating scale, medication(s)/side effects and non-pharmacologic comfort measures Outcome: Progressing   

## 2021-07-01 NOTE — Progress Notes (Signed)
PT Cancellation Note ? ?Patient Details ?Name: [name] ?MRN: [id number] ?DOB: 02/20/1951 ? ? ?Cancelled Treatment:    Reason Eval/Treat Not Completed: Patient at procedure or test/unavailable. PT orders received and pt chart reviewed. PT to initially enter the room at [phone] with pt requesting PT come back as she is eating breakfast. Returned at 1006 for PT evaluation, but pt about to leave the floor for imaging. Will follow up with PT evaluation as appropriate. ? ? ? ? ?Herminio Commons, PT, DPT ?10:12 AM,07/01/21 ? ?

## 2021-07-01 NOTE — Evaluation (Signed)
Occupational Therapy Evaluation Patient Details Name: [name] MRN: [id number] DOB: 02/28/1951 Today's Date: 07/01/2021   History of Present Illness Pt admitted to [facility] on 06/30/21 under observation for fall at home in the shower, with c/o L sided leg, arm, and hip pain (imaging negative for fx). Pt endorsed additional fall 1 week ago where she hit her head. MRI brain reveals small chronic infarct within the right cerebellar hemisphere (new from the prior MRI of 01/27/2020). CT thoracic spine reveals chronic T12 compression fracture. Significant PMH includes: multiple sclerosis, small chronic infarct within the left cerebellar hemisphere, depression, hypertension, chronic indwelling Foley catheter, COPD, follicular lymphoma   Clinical Impression   Pt seen for OT/PT co-evaluation this date. Prior to admission, pt was using a RW for walking short household distances (using a WC for longer household distances), performing UB ADLs and toileting independently, and receiving assistance from husband for LB dressing/bathing. Pt currently presents with decreased strength, balance, and activity tolerance. Due to these functional impairments, pt requires MOD A for bed mobility, MAX A for seated LB dressing, and MOD-MAX A for sit>stand transfers. Following initial sit>stand transfer, pt became incontinent of stool, requiring MAX A for sit>stand peri-care. Following x4 sit>stand transfers, pt performed stand pivot transfer bed>recliner with MIN A +2. Pt left sitting upright in bed with all needs within reach. Pt would benefit from additional skilled OT services to maximize return to PLOF and minimize risk of future falls, injury, caregiver burden, and readmission. Upon discharge, recommend SNF.    Recommendations for follow up therapy are one component of a multi-disciplinary discharge planning process, led by the attending physician.  Recommendations may be updated based on patient status, additional functional  criteria and insurance authorization.   Follow Up Recommendations  Skilled nursing-short term rehab (<3 hours/day)    Assistance Recommended at Discharge Intermittent Supervision/Assistance  Patient can return home with the following Two people to help with walking and/or transfers;A lot of help with bathing/dressing/bathroom    Functional Status Assessment  Patient has had a recent decline in their functional status and demonstrates the ability to make significant improvements in function in a reasonable and predictable amount of time.  Equipment Recommendations  Other (comment) (defer to next venue of care)       Precautions / Restrictions Precautions Precautions: Fall Restrictions Weight Bearing Restrictions: No      Mobility Bed Mobility Overal bed mobility: Needs Assistance Bed Mobility: Supine to Sit     Supine to sit: Mod assist, HOB elevated     General bed mobility comments: Requires MOD A to bring trunk upright and bring hips toward EOB    Transfers Overall transfer level: Needs assistance Equipment used: Rolling walker (2 wheels) (bari-RW) Transfers: Sit to/from Stand, Bed to chair/wheelchair/BSC Sit to Stand: Mod assist, Max assist, +2 physical assistance, From elevated surface Stand pivot transfers: Min assist, +2 physical assistance         General transfer comment: x4 sit<>stand bouts; initially required MOD A+2 however progressed to MOD/MAX A of 1-person      Balance Overall balance assessment: Needs assistance Sitting-balance support: No upper extremity supported, Feet supported Sitting balance-Leahy Scale: Fair Sitting balance - Comments: supervision sitting EOB and reaching within BOS   Standing balance support: Bilateral upper extremity supported, During functional activity Standing balance-Leahy Scale: Poor Standing balance comment: Requires MIN A for static standing balance  ADL either performed or  assessed with clinical judgement   ADL Overall ADL's : Needs assistance/impaired     Grooming: Brushing hair;Set up;Sitting               Lower Body Dressing: Maximal assistance;Sitting/lateral leans Lower Body Dressing Details (indicate cue type and reason): to don/doff socks     Toileting- Clothing Manipulation and Hygiene: Maximal assistance;Sit to/from stand Toileting - Clothing Manipulation Details (indicate cue type and reason): pt put forth good effort to perform peri-care in standing position, however ultimately requiring MAX A d/t body habitus     Functional mobility during ADLs: Minimal assistance;+2 for safety/equipment;Rolling walker (2 wheels) (bari RW, ~6 small shuffling steps from bed to recliner)       Vision Baseline Vision/History: 0 No visual deficits Ability to See in Adequate Light: 0 Adequate Patient Visual Report: No change from baseline              Pertinent Vitals/Pain Pain Assessment Pain Assessment: Faces Faces Pain Scale: Hurts a little bit Pain Location: L pinky toe Pain Descriptors / Indicators: Aching Pain Intervention(s): Limited activity within patient's tolerance, Monitored during session     Hand Dominance Right   Extremity/Trunk Assessment Upper Extremity Assessment Upper Extremity Assessment: RUE deficits/detail;Generalized weakness RUE Deficits / Details: AROM shoulder flex aprox 0-80 degrees d/t baseline shoulder pain with ROM. Grossly at least 3+/5 otherwise   Lower Extremity Assessment Lower Extremity Assessment: Generalized weakness;Defer to PT evaluation       Communication Communication Communication: No difficulties   Cognition Arousal/Alertness: Awake/alert Behavior During Therapy: WFL for tasks assessed/performed Overall Cognitive Status: No family/caregiver present to determine baseline cognitive functioning                                 General Comments: Alert and oriented to self, place,  and situation. Consistently follow 1-step instructions with increased time                Home Living Family/patient expects to be discharged to:: Private residence Living Arrangements: Spouse/significant other Available Help at Discharge: Family;Available PRN/intermittently Type of Home: House Home Access: Stairs to enter;Ramped entrance Entrance Stairs-Number of Steps: has a ramp, then up two more steps Entrance Stairs-Rails: None Home Layout: One level     Bathroom Shower/Tub: Walk-in shower (lip to step over shower)   Bathroom Toilet: Handicapped height (BSC over commode) Bathroom Accessibility: No (hasn't been able to stand at sink in over a week)   Home Equipment: Wheelchair - manual;Rollator (4 wheels);Grab bars - tub/shower;Shower seat          Prior Functioning/Environment Prior Level of Function : Needs assist       Physical Assist : Mobility (physical);ADLs (physical) Mobility (physical): Transfers ADLs (physical): Dressing;Bathing;IADLs Mobility Comments: Pt reports that she uses RW for short household distances and WC for longer distances. Reports she is unable to amb far w/in the home as her husband has it very "cluttered". Endorses 20+ falls in the past year ADLs Comments: Pt reports that she is independent with seated UB ADLs. Requires assistance from husband for LB dressing/bathing. Reports being independent with toileting        OT Problem List: Decreased strength;Decreased activity tolerance;Impaired balance (sitting and/or standing)      OT Treatment/Interventions: Self-care/ADL training;Therapeutic exercise;DME and/or AE instruction;Energy conservation;Therapeutic activities;Patient/family education;Balance training    OT Goals(Current goals can be found in the care plan section)  Acute Rehab OT Goals Patient Stated Goal: to get stronger OT Goal Formulation: With patient Time For Goal Achievement: 07/15/21 Potential to Achieve Goals: Good ADL  Goals Pt Will Perform Upper Body Bathing: with supervision;with set-up;sitting Pt Will Transfer to Toilet: with min assist;stand pivot transfer;bedside commode Pt Will Perform Toileting - Clothing Manipulation and hygiene: with min assist;sitting/lateral leans  OT Frequency: Min 2X/week    Co-evaluation PT/OT/SLP Co-Evaluation/Treatment: Yes Reason for Co-Treatment: For patient/therapist safety;To address functional/ADL transfers PT goals addressed during session: Mobility/safety with mobility;Balance OT goals addressed during session: ADL's and self-care      AM-PAC OT "6 Clicks" Daily Activity     Outcome Measure Help from another person eating meals?: None Help from another person taking care of personal grooming?: A Little Help from another person toileting, which includes using toliet, bedpan, or urinal?: A Lot Help from another person bathing (including washing, rinsing, drying)?: A Lot Help from another person to put on and taking off regular upper body clothing?: A Little Help from another person to put on and taking off regular lower body clothing?: A Lot 6 Click Score: 16   End of Session Equipment Utilized During Treatment: Gait belt;Rolling walker (2 wheels) Nurse Communication: Mobility status  Activity Tolerance: Patient tolerated treatment well Patient left: in chair;with call bell/phone within reach;with chair alarm set  OT Visit Diagnosis: Unsteadiness on feet (R26.81);Muscle weakness (generalized) (M62.81);Repeated falls (R29.6)                Time: 9935-7017 OT Time Calculation (min): 38 min Charges:  OT General Charges $OT Visit: 1 Visit OT Evaluation $OT Eval Moderate Complexity: 1 Mod OT Treatments $Self Care/Home Management : 8-22 mins  Fredirick Maudlin, OTR/L [place]

## 2021-07-01 NOTE — Consult Note (Signed)
NEUROLOGY CONSULTATION NOTE   Date of service: July 01, 2021 Patient Name: [name] MRN:  [id number] DOB:  Mar 07, 1951 Reason for consult: ?MS flare Requesting physician: Dr. Royce Macadamia Agbata _ _ _   _ __   _ __ _ _  __ __   _ __   __ _  History of Present Illness   This is a 71 year old woman with a history of multiple medical problems including MS followed as an outpatient by Dr. Brigitte Pulse.  Neurology is consulted 2/2 c/f possible new exacerbation. She was last admitted for an exacerbation in September 2021 for which she received Solu-Medrol.  Symptoms at that time were left-sided weakness.  She is admitted this time due to frequent falls including 1 while she was taking a shower the other day.  She is not sure if she is significantly weaker than usual and when I asked her to tell me her primary concerns for this hospitalization she discusses her chronic pain and the fact that her husband is extremely unkind to her at home telling her that MS is actually just laziness and not assisting her in her ADLs.  She thinks maybe her right leg is giving out more than it typically does although she is largely wheelchair bound at baseline. Per review of Dr. Trena Platt most recent clinic note in Sept 2022 and subsequent telephone notes his feeling was that she had most likely transitioned to secondary progressive MS. Numerous possible treatments were discussed and it sounds like they had decided to move ahead with patient assistance application for Zeposia but she does not recall what the status is of this (it is unclear if she went into the office to sign the application per Dr. Trena Platt message). She was most recently on aubagio prior to her Sept 2021 admission but this was discontinued 2/2 change in insurance.  CNS imaging this admission:  MRI brain wwo  Significantly motion degraded examination, limiting evaluation.   Unchanged extensive cerebral white matter disease consistent with the provided history of  multiple sclerosis. Within described limitations, no pathologic intracranial enhancement is identified to suggest active demyelination.   Redemonstrated small chronic infarct within the left cerebellar hemisphere. A small chronic infarct within the right cerebellar hemisphere is new from the prior MRI of 01/27/2020  MRI t spine wwo  1. Evidence of chronic thoracic spinal cord demyelination and atrophy. No acute finding in the thoracic spine.   2. Chronic T12 compression fracture with partial T11-T12 ankylosis. Adjacent segment disease at T10-T11 with moderate to severe left T10 foraminal stenosis but no significant spinal stenosis.   3. No other age advanced thoracic spine degeneration. See also Lumbar spine MRI reported separately.  MRI l spine wwo  1. No acute finding in the lumbar spine. Chronic T12 compression fracture, previous lumbar decompression and fusion, chronic severe underlying lumbar degeneration. 2. No significant spinal stenosis. But widespread chronic moderate and severe neural foraminal stenosis T12 through L5, sparing only the L3 and right L5 nerve levels. 3. Conus medullaris within normal limits. Thoracic spinal cord reported separately.    CNS imaging personally reviewed; I agree with above interpretations   ROS   Per HPI: all other systems reviewed and are negative  Past History   I have reviewed the following:  Past Medical History:  Diagnosis Date   Abdominal aortic atherosclerosis ([place]) 11/11/2016   ADHD    Anxiety    COPD (chronic obstructive pulmonary disease) (HCC)    Depression    major depressive  Dyspnea    doe   Edema    left leg   Follicular lymphoma (HCC)    B Cell   Follicular lymphoma grade II (HCC)    Hypertension    Hypotension    idiopathic   Kyphoscoliosis and scoliosis 11/26/2011   Morbid obesity ([place]) 01/05/2016   Multiple sclerosis (HCC)    Multiple sclerosis ([place])    1980's   Neuromuscular disorder ([place])     Obstructive and reflux uropathy    foley   Pain    atypical facial   Peripheral vascular disease of lower extremity with ulceration ([place]) 11/08/2015   Skin ulcer ([place]) 11/08/2015   Weakness    generalized. has MS   Past Surgical History:  Procedure Laterality Date   BACK SURGERY N/A 2002   CYST EXCISION     lower back   INGUINAL LYMPH NODE BIOPSY Left 07/04/2016   Procedure: INGUINAL LYMPH NODE BIOPSY;  Surgeon: Christene Lye, MD;  Location: [facility];  Service: General;  Laterality: Left;   ORIF FEMUR FRACTURE Left 02/04/2020   Procedure: OPEN REDUCTION INTERNAL FIXATION (ORIF) DISTAL FEMUR FRACTURE;  Surgeon: Shona Needles, MD;  Location: [facility];  Service: Orthopedics;  Laterality: Left;   PORTACATH PLACEMENT N/A 07/22/2016   Procedure: INSERTION PORT-A-CATH;  Surgeon: Christene Lye, MD;  Location: [facility];  Service: General;  Laterality: N/A;   TONSILLECTOMY AND ADENOIDECTOMY     TUBAL LIGATION     Family History  Problem Relation Age of Onset   COPD Mother    Diabetes Mother    Heart failure Mother    Alcohol abuse Father    Kidney disease Father    Kidney failure Father    Arthritis Sister    CAD Maternal Grandmother    Stroke Maternal Grandfather    Arthritis Sister    Mental illness Sister    Arthritis Brother    Social History   Socioeconomic History   Marital status: Married    Spouse name: Not on file   Number of children: 3   Years of education: Not on file   Highest education level: Not on file  Occupational History   Occupation: disabled  Tobacco Use   Smoking status: Former    Packs/day: 1.00    Years: 20.00    Pack years: 20.00    Types: Cigarettes    Start date: 04/30/1995    Quit date: 02/03/2016    Years since quitting: 5.4   Smokeless tobacco: Never  Vaping Use   Vaping Use: Some days  Substance and Sexual Activity   Alcohol use: No    Alcohol/week: 0.0 standard drinks   Drug use: Yes    Types: Marijuana    Comment: smokes  THC occasionally per pt    Sexual activity: Not Currently    Birth control/protection: None  Other Topics Concern   Not on file  Social History Narrative   She is married.    Social Determinants of Health   Financial Resource Strain: Not on file  Food Insecurity: Not on file  Transportation Needs: Not on file  Physical Activity: Not on file  Stress: Not on file  Social Connections: Not on file   No Known Allergies  Medications   Medications Prior to Admission  Medication Sig Dispense Refill Last Dose   acetaminophen (TYLENOL) 500 MG tablet Take 1,000 mg by mouth every 8 (eight) hours as needed for moderate pain.   06/29/2021   amLODipine (NORVASC)  5 MG tablet Take 5 mg by mouth daily.   06/29/2021 at AM   atorvastatin (LIPITOR) 10 MG tablet Take 10 mg by mouth at bedtime.    06/29/2021 at AM   buPROPion (WELLBUTRIN XL) 300 MG 24 hr tablet Take 300 mg by mouth daily.   06/29/2021 at AM   clonazePAM (KLONOPIN) 0.5 MG tablet Take 1 tablet (0.5 mg total) by mouth 2 (two) times daily. 30 tablet 0 06/29/2021   cyanocobalamin 1000 MCG tablet Take 1,000 mcg by mouth daily.   06/29/2021 at AM   docusate sodium (COLACE) 100 MG capsule Take 1 capsule (100 mg total) by mouth 2 (two) times daily as needed.   Past Week at PRN   DULoxetine (CYMBALTA) 60 MG capsule Take 1 capsule (60 mg total) by mouth every morning. (Patient taking differently: Take 60 mg by mouth daily.) 30 capsule 3 06/29/2021   gabapentin (NEURONTIN) 600 MG tablet Take 600 mg by mouth 3 (three) times daily.   06/29/2021   hydrALAZINE (APRESOLINE) 50 MG tablet Take 50 mg by mouth in the morning and at bedtime.   06/29/2021   lisinopril (ZESTRIL) 20 MG tablet Take 0.5 tablets (10 mg total) by mouth daily.  0 06/29/2021   Multiple Vitamin (MULTIVITAMIN WITH MINERALS) TABS tablet Take 1 tablet by mouth daily.   06/29/2021   MYRBETRIQ 50 MG TB24 tablet TAKE ONE TABLET BY MOUTH ONCE DAILY 30 tablet 0 06/29/2021   QUEtiapine Fumarate (SEROQUEL XR) 150 MG  24 hr tablet Take 150 mg by mouth at bedtime.   4 Past Week at PM   Teriflunomide 14 MG TABS Take 14 mg by mouth daily.   06/29/2021   torsemide (DEMADEX) 20 MG tablet Take 20 mg by mouth daily.   Past Week   traZODone (DESYREL) 50 MG tablet Take 50 mg by mouth at bedtime.   Past Week   vortioxetine HBr (TRINTELLIX) 5 MG TABS tablet Take 5 mg by mouth daily.   Past Week   budesonide-formoterol (SYMBICORT) 160-4.5 MCG/ACT inhaler Inhale 2 puffs into the lungs 2 (two) times daily.    06/28/2021   magnesium oxide (MAG-OX) 400 MG tablet Take 400 mg by mouth daily.      polyethylene glycol (MIRALAX / GLYCOLAX) 17 g packet Take 17 g by mouth daily as needed for mild constipation. 14 each 0 PRN at PRN   saccharomyces boulardii (FLORASTOR) 250 MG capsule Take 1 capsule (250 mg total) by mouth 2 (two) times daily.         Current Facility-Administered Medications:    0.9 %  sodium chloride infusion, 250 mL, Intravenous, PRN, Agbata, Tochukwu, MD   acetaminophen (TYLENOL) tablet 1,000 mg, 1,000 mg, Oral, Q8H PRN, Agbata, Tochukwu, MD, 1,000 mg at 06/30/21 1610   amLODipine (NORVASC) tablet 5 mg, 5 mg, Oral, Daily, Agbata, Tochukwu, MD, 5 mg at 06/30/21 1213   atorvastatin (LIPITOR) tablet 10 mg, 10 mg, Oral, QHS, Agbata, Tochukwu, MD, 10 mg at 06/30/21 2158   buPROPion (WELLBUTRIN XL) 24 hr tablet 300 mg, 300 mg, Oral, Daily, Agbata, Tochukwu, MD, 300 mg at 06/30/21 1213   cefTRIAXone (ROCEPHIN) 1 g in sodium chloride 0.9 % 100 mL IVPB, 1 g, Intravenous, Once, Agbata, Tochukwu, MD   clonazePAM (KLONOPIN) tablet 0.5 mg, 0.5 mg, Oral, BID, Agbata, Tochukwu, MD, 0.5 mg at 06/30/21 2158   docusate sodium (COLACE) capsule 100 mg, 100 mg, Oral, BID PRN, Agbata, Tochukwu, MD   DULoxetine (CYMBALTA) DR capsule 60 mg, 60  mg, Oral, Daily, Agbata, Tochukwu, MD, 60 mg at 06/30/21 1213   enoxaparin (LOVENOX) injection 60 mg, 60 mg, Subcutaneous, Q24H, Agbata, Tochukwu, MD, 60 mg at 06/30/21 1215   gabapentin (NEURONTIN)  tablet 600 mg, 600 mg, Oral, TID, Agbata, Tochukwu, MD, 600 mg at 06/30/21 2158   influenza vaccine adjuvanted (FLUAD) injection 0.5 mL, 0.5 mL, Intramuscular, Tomorrow-1000, Agbata, Tochukwu, MD   mirabegron ER (MYRBETRIQ) tablet 50 mg, 50 mg, Oral, Daily, Agbata, Tochukwu, MD, 50 mg at 06/30/21 1610   mometasone-formoterol (DULERA) 200-5 MCG/ACT inhaler 2 puff, 2 puff, Inhalation, BID, Agbata, Tochukwu, MD, 2 puff at 06/30/21 2257   multivitamin with minerals tablet 1 tablet, 1 tablet, Oral, Daily, Agbata, Tochukwu, MD, 1 tablet at 06/30/21 1213   ondansetron (ZOFRAN) tablet 4 mg, 4 mg, Oral, Q6H PRN **OR** ondansetron (ZOFRAN) injection 4 mg, 4 mg, Intravenous, Q6H PRN, Agbata, Tochukwu, MD   QUEtiapine (SEROQUEL XR) 24 hr tablet 150 mg, 150 mg, Oral, QHS, Agbata, Tochukwu, MD, 150 mg at 06/30/21 2257   sodium chloride flush (NS) 0.9 % injection 3 mL, 3 mL, Intravenous, Q12H, Agbata, Tochukwu, MD, 3 mL at 06/30/21 2159   sodium chloride flush (NS) 0.9 % injection 3 mL, 3 mL, Intravenous, PRN, Agbata, Tochukwu, MD   Teriflunomide TABS 14 mg, 14 mg, Oral, Daily, Agbata, Tochukwu, MD   torsemide (DEMADEX) tablet 20 mg, 20 mg, Oral, Daily, Agbata, Tochukwu, MD, 20 mg at 06/30/21 1213   traZODone (DESYREL) tablet 50 mg, 50 mg, Oral, QHS, Agbata, Tochukwu, MD, 50 mg at 06/30/21 2158   vortioxetine HBr (TRINTELLIX) tablet 5 mg, 5 mg, Oral, Daily, Agbata, Tochukwu, MD, 5 mg at 06/30/21 1342  Facility-Administered Medications Ordered in Other Encounters:    heparin lock flush 100 unit/mL, 500 Units, Intracatheter, Once PRN, Sindy Guadeloupe, MD  Vitals   Vitals:   06/30/21 1620 06/30/21 1729 06/30/21 2015 07/01/21 0558  BP: (!) 117/59 101/66 (!) 113/52 (!) 103/52  Pulse: 76 (!) 52 71 (!) 58  Resp: '17 15 16 20  '$ Temp:  98 F (36.7 C) 98.3 F (36.8 C) 98 F (36.7 C)  TempSrc:  Oral    SpO2: 96% 99% 96% 91%  Weight:      Height:         Body mass index is 47.89 kg/m.  Physical Exam    Physical Exam Gen: A&O x4, NAD HEENT: Atraumatic, normocephalic;mucous membranes moist; oropharynx clear, tongue without atrophy or fasciculations. Neck: Supple, trachea midline. Resp: CTAB, no w/r/r CV: RRR, no m/g/r; nml S1 and S2. 2+ symmetric peripheral pulses. Abd: soft/NT/ND; nabs x 4 quad Extrem: Nml bulk; no cyanosis, clubbing, or edema.  Neuro: *MS: A&O x4. Follows multi-step commands.  *Speech: fluid, nondysarthric, able to name and repeat *CN:    I: Deferred   II,III: PERRLA with bilat APD, VFF by confrontation, optic discs unable to be visualized 2/2 pupillary constriction   III,IV,VI: L exotropia, no ptosis   V: Sensation intact from V1 to V3 to LT   VII: Eyelid closure was full.  Smile symmetric.   VIII: Hearing intact to voice   IX,X: Voice normal, palate elevates symmetrically    XI: SCM/trap 5/5 bilat   XII: Tongue protrudes midline, no atrophy or fasciculations  *Motor:   Normal bulk.  No tremor, rigidity or bradykinesia. Effort-limited, but at least 4+/5 diffusely BUE and 4/5 diffusely BLE with L foot drop (chronic) *Sensory: Decreased to PP BLE *Coordination: Apraxia LUE and LLE *Reflexes:  2+ brisk  and symmetric throughout without clonus; toes equiv bilat *Gait: deferred Labs   CBC:  Recent Labs  Lab 06/30/21 0118 07/01/21 0503  WBC 8.5 5.7  HGB 10.5* 10.0*  HCT 34.2* 31.0*  MCV 87.0 84.9  PLT 211 601    Basic Metabolic Panel:  Lab Results  Component Value Date   NA 136 07/01/2021   K 4.0 07/01/2021   CO2 24 07/01/2021   GLUCOSE 90 07/01/2021   BUN 24 (H) 07/01/2021   CREATININE 1.51 (H) 07/01/2021   CALCIUM 8.8 (L) 07/01/2021   GFRNONAA 37 (L) 07/01/2021   GFRAA 56 (L) 02/01/2020   Lipid Panel:  Lab Results  Component Value Date   LDLCALC 96 02/02/2018   HgbA1c:  Lab Results  Component Value Date   HGBA1C 5.4 01/26/2020   Urine Drug Screen:     Component Value Date/Time   LABOPIA NONE DETECTED 07/04/2016 1048   COCAINSCRNUR  NONE DETECTED 07/04/2016 1048   LABBENZ NONE DETECTED 07/04/2016 1048   AMPHETMU NONE DETECTED 07/04/2016 1048   THCU NONE DETECTED 07/04/2016 1048   LABBARB NONE DETECTED 07/04/2016 1048    Alcohol Level No results found for: ETH   Impression   This is a 71 year old woman with a history of multiple medical problems including MS followed as an outpatient by Dr. Brigitte Pulse.  Neurology is consulted 2/2 c/f possible new exacerbation. She is not on DMARD at this time as o/p. Her exam does not seem significantly different from Dr. Trena Platt last clinic note though my exam today was noted to be effort-limited. I will repeat brain MRI wwo (yesterday's exam was motion-degraded) and do MRI c spine wwo to rule out any active demyelination. MRI t spine showed no acute findings. If there is e/o acute demyelination will administer course of IV solumedrol. If not will have her f/u as outpatient with her neurologist Dr. Manuella Ghazi to resume discussions re: outpatient DMARD therapy.  Recommendations   - MRI brain wwo contrast repeat - MRI c spine wwo contrast  Will f/u on above studies. ______________________________________________________________________   Thank you for the opportunity to take part in the care of this patient. If you have any further questions, please contact the neurology consultation attending.  Signed,  Su Monks, MD [facility] [phone]  If 7pm- 7am, please page neurology on call as listed in [place].

## 2021-07-01 NOTE — Progress Notes (Signed)
OT Cancellation Note ? ?Patient Details ?Name: [name] ?MRN: [id number] ?DOB: 1951-01-22 ? ? ?Cancelled Treatment:    Reason Eval/Treat Not Completed: Patient at procedure or test/ unavailable. Order received and chart reviewed. Per discussion with RN, pt awaiting transport to MRI and unavailable for OT eval at this time. OT to re-attempt at later time/date as able.  ? ?Fredirick Maudlin, OTR/L ?[place] ? ?

## 2021-07-01 NOTE — Evaluation (Signed)
Physical Therapy Evaluation Patient Details Name: [name] MRN: [id number] DOB: 1950-10-14 Today's Date: 07/01/2021  History of Present Illness  Pt admitted to [facility] on 06/30/21 under observation for fall at home in the shower, with c/o L sided leg, arm, and hip pain (imaging negative for fx). Pt endorsed additional fall 1 week ago where she hit her head. MRI brain reveals small chronic infarct within the right cerebellar hemisphere (new from the prior MRI of 01/27/2020). CT thoracic spine reveals chronic T12 compression fracture. Significant PMH includes: multiple sclerosis, small chronic infarct within the left cerebellar hemisphere, depression, hypertension, chronic indwelling Foley catheter, COPD, follicular lymphoma   Clinical Impression  Pt is a 71 year old F admitted to hospital on 06/02/21 for a fall. At baseline, pt mainly uses WC for mobility but is able to walk short distances with rollator; mostly mod I for ADL's, but requires pericare from spouse; requires assist for IADL's (able to be Ind with simple meals). Pt presents with generalized weakness, decreased activity tolerance, impaired skin integrity, decreased gross balance, resulting in impaired functional mobility from baseline. Due to deficits, pt required mod assist for bed mobility, mod-max assist +1-2 for transfers, and min assist +1 to take a few steps from EOB>recliner with RW. Increased time/effort required during session for pericare after incontinent BM; pt unable to perform pericare Ind but was able to maintain static standing for ~1-2 minutes, x4 with seated rest break between. Decreased activity tolerance noted by RPE of 4-6/10 indicating "moderate activity" with limited mobility. Deficits limit the pt's ability to safely and independently perform ADL's, transfer, and ambulate. Pt will benefit from acute skilled PT services to address deficits for return to baseline function. At this time, PT recommends SNF at DC to address deficits  and improve overall safety with functional mobility. Pt agreeable.  Of note, pt mentioned multiple times during session that her spouse does not help her, that he leaves to "get away from her" and that he yells at her when she has an incontinent episode or when she needs help. PT asked if pt felt safe at home. She responds "yes, I just have to listen to his mouth." Pt seems upset and down when talking about this, and may benefit from spiritual/pastoral care consult.        Recommendations for follow up therapy are one component of a multi-disciplinary discharge planning process, led by the attending physician.  Recommendations may be updated based on patient status, additional functional criteria and insurance authorization.  Follow Up Recommendations Skilled nursing-short term rehab (<3 hours/day)    Assistance Recommended at Discharge Intermittent Supervision/Assistance  Patient can return home with the following  A lot of help with walking and/or transfers;A lot of help with bathing/dressing/bathroom;Help with stairs or ramp for entrance;Assist for transportation    Equipment Recommendations  (defer to post acute)     Functional Status Assessment Patient has had a recent decline in their functional status and demonstrates the ability to make significant improvements in function in a reasonable and predictable amount of time.     Precautions / Restrictions Precautions Precautions: Fall Restrictions Weight Bearing Restrictions: No      Mobility  Bed Mobility Overal bed mobility: Needs Assistance Bed Mobility: Supine to Sit     Supine to sit: Mod assist, HOB elevated     General bed mobility comments: Requires MOD A to bring trunk upright and bring hips toward EOB    Transfers Overall transfer level: Needs assistance Equipment  used: Rolling walker (2 wheels) (bari-RW) Transfers: Sit to/from Stand, Bed to chair/wheelchair/BSC Sit to Stand: Mod assist, Max assist, +2  physical assistance, From elevated surface           General transfer comment: x4 sit<>stand bouts; initially required MOD A+2 however progressed to MOD/MAX A of 1-person    Ambulation/Gait Ambulation/Gait assistance: Min assist Gait Distance (Feet): 2 Feet Assistive device: Rolling walker (2 wheels)         General Gait Details: able to take a few steps from EOB>recliner with RW. Demonstrates forward flexed posture, decreased step length/foot clearance bil, slowed cadence, and increased UE reliance on RW for steadying.     Balance Overall balance assessment: Needs assistance Sitting-balance support: No upper extremity supported, Feet supported Sitting balance-Leahy Scale: Fair Sitting balance - Comments: supervision sitting EOB and reaching within BOS   Standing balance support: Bilateral upper extremity supported, During functional activity Standing balance-Leahy Scale: Poor Standing balance comment: Requires MIN A for static standing balance                             Pertinent Vitals/Pain Pain Assessment Pain Assessment: Faces Faces Pain Scale: Hurts a little bit Pain Location: L pinky toe Pain Descriptors / Indicators: Aching Pain Intervention(s): Limited activity within patient's tolerance, Monitored during session    Home Living Family/patient expects to be discharged to:: Private residence Living Arrangements: Spouse/significant other Available Help at Discharge: Family;Available PRN/intermittently Type of Home: House Home Access: Stairs to enter;Ramped entrance Entrance Stairs-Rails: None Entrance Stairs-Number of Steps: has a ramp, then up two more steps   Home Layout: One level Home Equipment: Wheelchair - manual;Rollator (4 wheels);Grab bars - tub/shower;Shower seat      Prior Function Prior Level of Function : Needs assist       Physical Assist : Mobility (physical);ADLs (physical) Mobility (physical): Transfers ADLs (physical):  Dressing;Bathing;IADLs Mobility Comments: Pt reports that she uses RW for short household distances and WC for longer distances. Reports she is unable to amb far w/in the home as her husband has it very "cluttered". Endorses 20+ falls in the past year ADLs Comments: Pt reports that she is independent with seated UB ADLs. Requires assistance from husband for LB dressing/bathing. Reports being independent with toileting     Hand Dominance   Dominant Hand: Right    Extremity/Trunk Assessment   Upper Extremity Assessment Upper Extremity Assessment: Defer to OT evaluation RUE Deficits / Details: AROM shoulder flex aprox 0-80 degrees d/t baseline shoulder pain with ROM. Grossly at least 3+/5 otherwise    Lower Extremity Assessment Lower Extremity Assessment: Generalized weakness;Overall [facility] for tasks assessed (Grossly 4/5; diminished sensation L4-5; coordination intact)       Communication   Communication: No difficulties  Cognition Arousal/Alertness: Awake/alert Behavior During Therapy: WFL for tasks assessed/performed Overall Cognitive Status: No family/caregiver present to determine baseline cognitive functioning                                 General Comments: Alert and oriented to self, place, and situation. Consistently follow 1-step instructions with increased time        General Comments General comments (skin integrity, edema, etc.): bruising noted to L pinky toe; mild 2+ edema of LLE    Exercises Other Exercises Other Exercises: Bed mobility, transfers, gait, and pericare. Able to perform multiple STS from EOB with bariRW  for pericare after incontinent BM. Limited secondary to weakness and decreased activity tolerance. Educated re: PT role/POC, DC recommendations, pacing, safety with mobility.   Assessment/Plan    PT Assessment Patient needs continued PT services  PT Problem List Decreased strength;Decreased range of motion;Decreased activity  tolerance;Decreased balance;Decreased mobility;Obesity       PT Treatment Interventions DME instruction;Gait training;Stair training;Functional mobility training;Therapeutic activities;Therapeutic exercise;Balance training;Neuromuscular re-education;Patient/family education    PT Goals (Current goals can be found in the Care Plan section)  Acute Rehab PT Goals Patient Stated Goal: "get better" PT Goal Formulation: With patient Time For Goal Achievement: 07/15/21 Potential to Achieve Goals: Good    Frequency Min 2X/week     Co-evaluation   Reason for Co-Treatment: For patient/therapist safety;To address functional/ADL transfers PT goals addressed during session: Mobility/safety with mobility;Balance OT goals addressed during session: ADL's and self-care       AM-PAC PT "6 Clicks" Mobility  Outcome Measure Help needed turning from your back to your side while in a flat bed without using bedrails?: A Little Help needed moving from lying on your back to sitting on the side of a flat bed without using bedrails?: A Little Help needed moving to and from a bed to a chair (including a wheelchair)?: A Little Help needed standing up from a chair using your arms (e.g., wheelchair or bedside chair)?: A Lot Help needed to walk in hospital room?: A Lot Help needed climbing 3-5 steps with a railing? : A Lot 6 Click Score: 15    End of Session Equipment Utilized During Treatment: Gait belt Activity Tolerance: Patient tolerated treatment well;Patient limited by fatigue Patient left: in chair;with call bell/phone within reach;with chair alarm set (BLE elevated on pillow for edema management) Nurse Communication: Mobility status PT Visit Diagnosis: Unsteadiness on feet (R26.81);Muscle weakness (generalized) (M62.81);Repeated falls (R29.6);Difficulty in walking, not elsewhere classified (R26.2)    Time: 1610-9604 PT Time Calculation (min) (ACUTE ONLY): 38 min   Charges:   PT Evaluation $PT  Eval Moderate Complexity: 1 Mod PT Treatments $Therapeutic Activity: 8-22 mins           Herminio Commons, PT, DPT 3:11 PM,07/01/21

## 2021-07-01 NOTE — Plan of Care (Signed)

## 2021-07-01 NOTE — Plan of Care (Signed)
Neurology plan of care ? ?MRI brain and c spine wwo contrast showed no e/o active demyelination. No indication for steroids or further inpatient neurologic workup. Neurology to sign off. Patient should follow up with established outpatient neurologist Dr. Manuella Ghazi after hospital discharge to discuss MS medication regimen.  ? ?Su Monks, MD ?[facility] ?[phone] ? ?If 7pm- 7am, please page neurology on call as listed in [place]. ? ?

## 2021-07-02 DIAGNOSIS — I10 Essential (primary) hypertension: Secondary | ICD-10-CM

## 2021-07-02 LAB — RENAL FUNCTION PANEL
Albumin: 3.4 g/dL — ABNORMAL LOW (ref 3.5–5.0)
Anion gap: 9 (ref 5–15)
BUN: 25 mg/dL — ABNORMAL HIGH (ref 8–23)
CO2: 28 mmol/L (ref 22–32)
Calcium: 9 mg/dL (ref 8.9–10.3)
Chloride: 101 mmol/L (ref 98–111)
Creatinine, Ser: 1.44 mg/dL — ABNORMAL HIGH (ref 0.44–1.00)
GFR, Estimated: 39 mL/min — ABNORMAL LOW (ref >60)
Glucose, Bld: 96 mg/dL (ref 70–99)
Phosphorus: 4.2 mg/dL (ref 2.5–4.6)
Potassium: 3.7 mmol/L (ref 3.5–5.1)
Sodium: 138 mmol/L (ref 135–145)

## 2021-07-02 LAB — CBC
HCT: 31.9 % — ABNORMAL LOW (ref 36.0–46.0)
Hemoglobin: 10.1 g/dL — ABNORMAL LOW (ref 12.0–15.0)
MCH: 26.9 pg (ref 26.0–34.0)
MCHC: 31.7 g/dL (ref 30.0–36.0)
MCV: 85.1 fL (ref 80.0–100.0)
Platelets: 196 10*3/uL (ref 150–400)
RBC: 3.75 MIL/uL — ABNORMAL LOW (ref 3.87–5.11)
RDW: 14.5 % (ref 11.5–15.5)
WBC: 6.3 10*3/uL (ref 4.0–10.5)
nRBC: 0 % (ref 0.0–0.2)

## 2021-07-02 LAB — MAGNESIUM: Magnesium: 1.9 mg/dL (ref 1.7–2.4)

## 2021-07-02 LAB — PHOSPHORUS: Phosphorus: 4.1 mg/dL (ref 2.5–4.6)

## 2021-07-02 MED ORDER — GABAPENTIN 400 MG PO CAPS
400.0000 mg | ORAL_CAPSULE | Freq: Three times a day (TID) | ORAL | Status: DC
  Administered 2021-07-02 – 2021-07-05 (×9): 400 mg via ORAL
  Filled 2021-07-02 (×9): qty 1

## 2021-07-02 MED ORDER — GUAIFENESIN-DM 100-10 MG/5ML PO SYRP
5.0000 mL | ORAL_SOLUTION | ORAL | Status: DC | PRN
  Administered 2021-07-03: 5 mL via ORAL
  Filled 2021-07-02: qty 5

## 2021-07-02 NOTE — Plan of Care (Signed)
  Problem: Education: Goal: Knowledge of General Education information will improve Description: Including pain rating scale, medication(s)/side effects and non-pharmacologic comfort measures Outcome: Progressing   Problem: Health Behavior/Discharge Planning: Goal: Ability to manage health-related needs will improve Outcome: Progressing   Problem: Clinical Measurements: Goal: Will remain free from infection Outcome: Progressing   Problem: Clinical Measurements: Goal: Diagnostic test results will improve Outcome: Progressing   Problem: Clinical Measurements: Goal: Respiratory complications will improve Outcome: Progressing   

## 2021-07-02 NOTE — Progress Notes (Signed)
?Progress Note ? ? ?Patient: [name] MEB:[id number] DOB: [birth year] DOA: 06/30/2021     1 ?DOS: the patient was seen and examined on 07/02/2021 ?  ?Brief hospital course: ?Taken from prior notes. ? ?BRYAHNA LESKO is a 71 y.o. female with medical history significant for multiple sclerosis, depression, hypertension, chronic indwelling Foley catheter, COPD, follicular lymphoma who presents to [facility] ED for evaluation following a fall at home.  Endorses she has had multiple falls recently with the last fall being 1 day prior to her admission while in the bathroom.  She is mostly wheelchair-bound but states that she is able to ambulate short distances with her rolling walker.  She presented to the ER with complaints of pain involving her left hip and leg.  ? ?Neurology was consulted for concern of a possible MS flare.  Repeat MRI brain and cervical spine was without any new demyelinating lesions.  Neurology signed off and recommending outpatient follow-up and to consider MS medications. ? ?PT/OT is recommending rehab.  TOC is working on it ? ? ?Assessment and Plan: ?* Fall ?Patient with a history of multiple sclerosis who presents to the ER for evaluation after she had multiple falls at home. ?She complains of bilateral lower extremity weakness but more involving the right lower extremity. ?At baseline she is wheelchair-bound but states she is able to ambulate short distances with a rolling walker. ?PT/OT is recommending SNF ?-TOC consult ?-Continue with fall precautions ? ?Multiple sclerosis ([place]) ?Repeat MRI brain and C-spine was negative for any new lesions.  Neurology signed off. ?-Continue teriflunomide ?-Outpatient follow-up with neurology for further recommendations ? ? ?Foley catheter in place on admission ?Patient has a history of neurogenic bladder secondary to known MS ?She has a chronic indwelling Foley catheter in place that was last exchanged 6 weeks ago ?Foley catheter was replaced in the ER ?Continued  Myrbetriq ? ?UTI (urinary tract infection) ?Patient has pyuria and has a chronic indwelling Foley catheter in place ?Urine culture with multiple species.No urinary symptoms. ?-Stop ABx. ? ?Depression ?Stable ?Continue bupropion, Seroquel and Trintellix ? ?Obesity, Class III, BMI 40-49.9 (morbid obesity) ([place]) ?Patient has a BMI of 47.89 kg/m2 ?Complicates overall prognosis and care ? ?CKD (chronic kidney disease) stage 3, GFR 30-59 ml/min (HCC) ?Patient has a history of stage III chronic kidney disease ?Renal function appears stable at this time ?We will monitor closely during this hospitalization ? ?Essential hypertension ?- Keep Hold lisinopril and hydralazine since patient is normotensive ?-Continue amlodipine ? ? ?Subjective: Patient continue to feel weak. No other complaints. ? ?Physical Exam: ?Vitals:  ? 07/01/21 2057 07/02/21 0127 07/02/21 0600 07/02/21 1624  ?BP: (!) 128/45 (!) 122/47 (!) 125/48 (!) 109/48  ?Pulse: 73 70 (!) 59 (!) 108  ?Resp: '18 16 16 16  '$ ?Temp: 98.1 ?F (36.7 ?C) 98.4 ?F (36.9 ?C) 98.1 ?F (36.7 ?C) 98.5 ?F (36.9 ?C)  ?TempSrc: Axillary     ?SpO2: 96% 93% 91% 97%  ?Weight:      ?Height:      ? ?General. Obese lady, In no acute distress. ?Pulmonary.  Lungs clear bilaterally, normal respiratory effort. ?CV.  Regular rate and rhythm, no JVD, rub or murmur. ?Abdomen.  Soft, nontender, nondistended, BS positive. ?CNS.  Alert and oriented .  No focal neurologic deficit. ?Extremities.  No edema, no cyanosis, pulses intact and symmetrical. ?Psychiatry.  Judgment and insight appears normal. ? ?Data Reviewed: ?Prior notes, labs and images reviewed. ? ?Family Communication:  ? ?Disposition: ?Status is: Inpatient ?  Remains inpatient appropriate because: Severity of illness. ? ? Planned Discharge Destination: Skilled nursing facility ? ?Time spent: 45 minutes ? ?This record has been created using Systems analyst. Errors have been sought and corrected,but may not always be located. Such  creation errors do not reflect on the standard of care. ? ?Author: ?Lorella Nimrod, MD ?07/02/2021 5:36 PM ? ?For on call review [email or web address].  ?

## 2021-07-02 NOTE — Progress Notes (Signed)
Physical Therapy Treatment ?Patient Details ?Name: [name] ?MRN: [id number] ?DOB: 12-30-1950 ?Today's Date: 07/02/2021 ? ? ?History of Present Illness Pt admitted to [facility] on 06/30/21 under observation for fall at home in the shower, with c/o L sided leg, arm, and hip pain (imaging negative for fx). Pt endorsed additional fall 1 week ago where she hit her head. MRI brain reveals small chronic infarct within the right cerebellar hemisphere (new from the prior MRI of 01/27/2020). CT thoracic spine reveals chronic T12 compression fracture. Significant PMH includes: multiple sclerosis, small chronic infarct within the left cerebellar hemisphere, depression, hypertension, chronic indwelling Foley catheter, COPD, follicular lymphoma ? ?  ?PT Comments  ? ? Pt was pleasant and motivated to participate during the session and put forth good effort throughout. Pt required extensive +2 assistance with bed mobility tasks despite good effort and the use of the bed rails with cues for sequencing.  Pt required +2 assist to come to standing from an elevated surface with cues for hand placement.  Once in standing the pt was able to take several very small, effortful, shuffling steps at the EOB totaling at most 1 foot before was unable to ambulate further.  Pt was able to remain in standing, however, on two separate bouts totaling near 5 minutes of standing time between efforts.  Pt reported no adverse symptoms during the session other than a moderate HA that did not worsen with activity. Pt will benefit from PT services in a SNF setting upon discharge to safely address deficits listed in patient problem list for decreased caregiver assistance and eventual return to PLOF. ? ?   ?Recommendations for follow up therapy are one component of a multi-disciplinary discharge planning process, led by the attending physician.  Recommendations may be updated based on patient status, additional functional criteria and insurance  authorization. ? ?Follow Up Recommendations ? Skilled nursing-short term rehab (<3 hours/day) ?  ?  ?Assistance Recommended at Discharge Intermittent Supervision/Assistance  ?Patient can return home with the following Help with stairs or ramp for entrance;Assist for transportation;Two people to help with walking and/or transfers;A lot of help with bathing/dressing/bathroom;Assistance with cooking/housework ?  ?Equipment Recommendations ? Other (comment) (TBD at next venue of care)  ?  ?Recommendations for Other Services   ? ? ?  ?Precautions / Restrictions Precautions ?Precautions: Fall ?Restrictions ?Weight Bearing Restrictions: No  ?  ? ?Mobility ? Bed Mobility ?Overal bed mobility: Needs Assistance ?Bed Mobility: Supine to Sit, Sit to Supine ?  ?  ?Supine to sit: +2 for physical assistance, Mod assist ?Sit to supine: +2 for physical assistance, Mod assist ?  ?General bed mobility comments: +2 Mod A for BLE and trunk control ?  ? ?Transfers ?Overall transfer level: Needs assistance ?Equipment used: Rolling walker (2 wheels) ?Transfers: Sit to/from Stand ?Sit to Stand: Mod assist, +2 physical assistance, From elevated surface ?  ?  ?  ?  ?  ?General transfer comment: sit to/from stand x 2 bouts with total standing time around 5 minutes combined ?  ? ?Ambulation/Gait ?Ambulation/Gait assistance: Min assist ?Gait Distance (Feet): 1 Feet ?Assistive device: Rolling walker (2 wheels) ?Gait Pattern/deviations: Step-to pattern, Trunk flexed, Shuffle ?Gait velocity: decreased ?  ?  ?General Gait Details: Pt able to take a few short, shuffling steps at the EOB with heavy lean on the RW for support and with min A for stability and to guide the RW ? ? ?Stairs ?  ?  ?  ?  ?  ? ? ?  Wheelchair Mobility ?  ? ?Modified Rankin (Stroke Patients Only) ?  ? ? ?  ?Balance Overall balance assessment: Needs assistance ?Sitting-balance support: No upper extremity supported, Feet supported ?Sitting balance-Leahy Scale: Fair ?  ?  ?Standing  balance support: Bilateral upper extremity supported, During functional activity, Reliant on assistive device for balance ?Standing balance-Leahy Scale: Poor ?  ?  ?  ?  ?  ?  ?  ?  ?  ?  ?  ?  ?  ? ?  ?Cognition Arousal/Alertness: Awake/alert ?Behavior During Therapy: [facility] for tasks assessed/performed ?Overall Cognitive Status: Within Functional Limits for tasks assessed ?  ?  ?  ?  ?  ?  ?  ?  ?  ?  ?  ?  ?  ?  ?  ?  ?  ?  ?  ? ?  ?Exercises Total Joint Exercises ?Ankle Circles/Pumps: AROM, Strengthening, 5 reps, 10 reps, Right (chronic inabiltiy to DF/PF on the L ankle per pt) ?Quad Sets: Strengthening, Both, 5 reps, 10 reps ?Gluteal Sets: Strengthening, Both, 5 reps, 10 reps ?Heel Slides: AAROM, Strengthening, Both, 5 reps ?Hip ABduction/ADduction: AAROM, Strengthening, Both, 5 reps ? ?  ?General Comments   ?  ?  ? ?Pertinent Vitals/Pain Pain Assessment ?Pain Assessment: 0-10 ?Pain Score: 6  ?Pain Location: HA ?Pain Descriptors / Indicators: Headache ?Pain Intervention(s): Repositioned, Monitored during session, Premedicated before session  ? ? ?Home Living   ?  ?  ?  ?  ?  ?  ?  ?  ?  ?   ?  ?Prior Function    ?  ?  ?   ? ?PT Goals (current goals can now be found in the care plan section) Progress towards PT goals: Progressing toward goals ? ?  ?Frequency ? ? ? Min 2X/week ? ? ? ?  ?PT Plan Current plan remains appropriate  ? ? ?Co-evaluation   ?  ?  ?  ?  ? ?  ?AM-PAC PT "6 Clicks" Mobility   ?Outcome Measure ? Help needed turning from your back to your side while in a flat bed without using bedrails?: A Lot ?Help needed moving from lying on your back to sitting on the side of a flat bed without using bedrails?: Total ?Help needed moving to and from a bed to a chair (including a wheelchair)?: A Lot ?Help needed standing up from a chair using your arms (e.g., wheelchair or bedside chair)?: A Lot ?Help needed to walk in hospital room?: Total ?Help needed climbing 3-5 steps with a railing? : Total ?6 Click Score:  9 ? ?  ?End of Session Equipment Utilized During Treatment: Gait belt ?Activity Tolerance: Patient tolerated treatment well ?Patient left: in bed;with call bell/phone within reach;with bed alarm set ?Nurse Communication: Mobility status ?PT Visit Diagnosis: Unsteadiness on feet (R26.81);Muscle weakness (generalized) (M62.81);Repeated falls (R29.6);Difficulty in walking, not elsewhere classified (R26.2) ?  ? ? ?Time: 2725-3664 ?PT Time Calculation (min) (ACUTE ONLY): 27 min ? ?Charges:  $Therapeutic Exercise: 8-22 mins ?$Therapeutic Activity: 8-22 mins          ?          ? ?D. Royetta Asal PT, DPT ?07/02/21, 3:08 PM ? ? ?

## 2021-07-02 NOTE — Hospital Course (Addendum)
Taken from prior notes. ? ?[name] is a 71 y.o. female with medical history significant for multiple sclerosis, depression, hypertension, chronic indwelling Foley catheter, COPD, follicular lymphoma who presents to [facility] ED for evaluation following a fall at home.  Endorses she has had multiple falls recently with the last fall being 1 day prior to her admission while in the bathroom.  She is mostly wheelchair-bound but states that she is able to ambulate short distances with her rolling walker.  She presented to the ER with complaints of pain involving her left hip and leg.  ? ?Neurology was consulted for concern of a possible MS flare.  Repeat MRI brain and cervical spine was without any new demyelinating lesions.  Neurology signed off and recommending outpatient follow-up and to consider MS medications. ? ?PT/OT is recommending rehab.  TOC is working on it. ? ?3/7: Patient remained stable, waiting for insurance authorization. ? ?Patient remained stable, she is being discharged to rehab for further management ?Patient to have a close follow-up with her neurologist for further recommendations-start her on MS medications. ?
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
## 2021-07-02 NOTE — Plan of Care (Addendum)
Pt alert and oriented x 4. No prns given. Vitals stable. Pt had 2 bms this shift.  Pt reported that at home husband will not help fix meals. Asks her if she will just file for divorce. She reports he will not help with ADL's. Transition care consult placed.  ?Problem: Education: ?Goal: Knowledge of General Education information will improve ?Description: Including pain rating scale, medication(s)/side effects and non-pharmacologic comfort measures ?Outcome: Progressing ?  ?Problem: Health Behavior/Discharge Planning: ?Goal: Ability to manage health-related needs will improve ?Outcome: Progressing ?  ?Problem: Clinical Measurements: ?Goal: Ability to maintain clinical measurements within normal limits will improve ?Outcome: Progressing ?Goal: Will remain free from infection ?Outcome: Progressing ?Goal: Diagnostic test results will improve ?Outcome: Progressing ?Goal: Respiratory complications will improve ?Outcome: Progressing ?Goal: Cardiovascular complication will be avoided ?Outcome: Progressing ?  ?Problem: Activity: ?Goal: Risk for activity intolerance will decrease ?Outcome: Progressing ?  ?Problem: Nutrition: ?Goal: Adequate nutrition will be maintained ?Outcome: Progressing ?  ?Problem: Coping: ?Goal: Level of anxiety will decrease ?Outcome: Progressing ?  ?Problem: Elimination: ?Goal: Will not experience complications related to bowel motility ?Outcome: Progressing ?Goal: Will not experience complications related to urinary retention ?Outcome: Progressing ?  ?Problem: Pain Managment: ?Goal: General experience of comfort will improve ?Outcome: Progressing ?  ?Problem: Safety: ?Goal: Ability to remain free from injury will improve ?Outcome: Progressing ?  ?Problem: Skin Integrity: ?Goal: Risk for impaired skin integrity will decrease ?Outcome: Progressing ?  ?

## 2021-07-02 NOTE — NC FL2 (Signed)
?[place] MEDICAID FL2 LEVEL OF CARE SCREENING TOOL  ?  ? ?IDENTIFICATION  ?Patient Name: ?[name] Birthdate: August 10, 1950 Sex: female Admission Date (Current Location): ?06/30/2021  ?[place] and [place] Number: ? [place] ?  Facility and Address:  ?[facility], [address] [place],  [address] ?     Provider Number: ?[id number]  ?Attending Physician Name and Address:  ?Lorella Nimrod, MD ? Relative Name and Phone Number:  ?Jerrye Noble [phone] ?   ?Current Level of Care: ?Hospital Recommended Level of Care: ?[place] Prior Approval Number: ?  ? ?Date Approved/Denied: ?  PASRR Number: ?[id number] B ? ?Discharge Plan: ?SNF ?  ? ?Current Diagnoses: ?Patient Active Problem List  ? Diagnosis Date Noted  ? Closed fracture of distal end of left femur ([place]) 02/04/2020  ? Femur fracture, left ([place]) 02/04/2020  ? Weakness of left leg 01/26/2020  ? Left leg weakness 01/26/2020  ? COPD (chronic obstructive pulmonary disease) ([place]) 01/01/2020  ? Foley catheter in place on admission 11/19/2019  ? Neuropathy   ? Hypokalemia   ? Hyperlipidemia   ? Sepsis secondary to UTI ([place]) 10/17/2019  ? Weakness   ? AKI (acute kidney injury) ([place]) 04/20/2019  ? Pressure injury of skin 02/03/2019  ? Bilateral lower leg cellulitis 02/02/2019  ? Ovarian mass, left 01/27/2019  ? Sepsis ([place]) 01/05/2019  ? UTI (urinary tract infection) 06/13/2018  ? Altered mental status 06/11/2018  ? Fall 05/13/2018  ? Depression 05/13/2018  ? Recurrent cellulitis of lower extremity 06/25/2017  ? Medication monitoring encounter 06/05/2017  ? Cellulitis of left lower leg 04/25/2017  ? Adjustment disorder with mixed disturbance of emotions and conduct 04/23/2017  ? Foot pain, bilateral 02/03/2017  ? Tinea pedis 02/03/2017  ? Left ovarian cyst 12/09/2016  ? Abdominal aortic atherosclerosis ([place]) 11/11/2016  ? Cellulitis of left lower extremity 10/11/2016  ? Swelling of lower extremity 10/11/2016  ? Obstructive  sleep apnea 10/11/2016  ? Follicular lymphoma of intra-abdominal lymph nodes ([place]) 08/06/2016  ? Multiple falls 06/07/2016  ? Inguinal adenopathy 05/29/2016  ? Obesity, Class III, BMI 40-49.9 (morbid obesity) ([place]) 01/05/2016  ? Low HDL (under 40) 12/19/2015  ? Cellulitis 12/18/2015  ? Skin ulcer ([place]) 11/08/2015  ? Peripheral vascular disease of lower extremity with ulceration ([place]) 11/08/2015  ? CKD (chronic kidney disease) stage 3, GFR 30-59 ml/min (HCC) 11/08/2015  ? Constipation due to pain medication 01/31/2015  ? Obstructive sleep apnea of adult 01/13/2015  ? Pelvic muscle wasting 01/13/2015  ? Incomplete bladder emptying 01/13/2015  ? Headache, migraine 10/24/2014  ? Atonic neurogenic bladder 10/24/2014  ? Current tobacco use 10/24/2014  ? Lumbar radiculopathy, chronic 10/02/2013  ? COPD with bronchial hyperresponsiveness ([place]) 10/02/2013  ? Major depressive disorder, recurrent, in partial remission ([place]) 10/02/2013  ? Essential hypertension 10/02/2013  ? Multiple sclerosis ([place]) 10/02/2013  ? Absence of bladder continence 09/25/2012  ? Acontractile bladder 02/12/2012  ? Narrowing of intervertebral disc space 11/26/2011  ? Kyphoscoliosis and scoliosis 11/26/2011  ? ? ?Orientation RESPIRATION BLADDER Height & Weight   ?  ?Self, Time, Situation, Place ? Normal Continent Weight: 126.6 kg ?Height:  '5\' 4"'$  (162.6 cm)  ?BEHAVIORAL SYMPTOMS/MOOD NEUROLOGICAL BOWEL NUTRITION STATUS  ?    Continent Diet (regular)  ?AMBULATORY STATUS COMMUNICATION OF NEEDS Skin   ?Extensive Assist Verbally Normal ?  ?  ?  ?    ?     ?     ? ? ?Personal Care Assistance Level of Assistance  ?  Bathing, Feeding, Dressing Bathing Assistance: Maximum assistance ?Feeding assistance: Independent ?Dressing Assistance: Maximum assistance ?   ? ?Functional Limitations Info  ?    ?  ?   ? ? ?SPECIAL CARE FACTORS FREQUENCY  ?PT (By licensed PT), OT (By licensed OT)   ?  ?PT Frequency: 5 times per week ?OT Frequency: 5 times per week ?  ?  ?  ?    ? ? ?Contractures Contractures Info: Not present  ? ? ?Additional Factors Info  ?Code Status, Allergies Code Status Info: full ?Allergies Info: NKDA ?  ?  ?  ?   ? ?Current Medications (07/02/2021):  This is the current hospital active medication list ?Current Facility-Administered Medications  ?Medication Dose Route Frequency Provider Last Rate Last Admin  ? 0.9 %  sodium chloride infusion  250 mL Intravenous PRN Agbata, Tochukwu, MD      ? acetaminophen (TYLENOL) tablet 1,000 mg  1,000 mg Oral Q8H PRN Agbata, Tochukwu, MD   1,000 mg at 06/30/21 1610  ? atorvastatin (LIPITOR) tablet 10 mg  10 mg Oral QHS Agbata, Tochukwu, MD   10 mg at 07/01/21 2050  ? buPROPion (WELLBUTRIN XL) 24 hr tablet 300 mg  300 mg Oral Daily Agbata, Tochukwu, MD   300 mg at 07/02/21 7867  ? Chlorhexidine Gluconate Cloth 2 % PADS 6 each  6 each Topical Daily Kayleen Memos, DO   6 each at 07/02/21 6720  ? clonazePAM (KLONOPIN) tablet 0.5 mg  0.5 mg Oral BID Agbata, Tochukwu, MD   0.5 mg at 07/02/21 9470  ? docusate sodium (COLACE) capsule 100 mg  100 mg Oral BID PRN Agbata, Tochukwu, MD      ? DULoxetine (CYMBALTA) DR capsule 60 mg  60 mg Oral Daily Agbata, Tochukwu, MD   60 mg at 07/02/21 0928  ? enoxaparin (LOVENOX) injection 60 mg  60 mg Subcutaneous Q24H Agbata, Tochukwu, MD   60 mg at 07/02/21 9628  ? gabapentin (NEURONTIN) tablet 600 mg  600 mg Oral TID Collier [place], MD   600 mg at 07/02/21 3662  ? mirabegron ER (MYRBETRIQ) tablet 50 mg  50 mg Oral Daily Agbata, Tochukwu, MD   50 mg at 07/02/21 9476  ? mometasone-formoterol (DULERA) 200-5 MCG/ACT inhaler 2 puff  2 puff Inhalation BID Agbata, Tochukwu, MD   2 puff at 07/02/21 5465  ? multivitamin with minerals tablet 1 tablet  1 tablet Oral Daily Agbata, Tochukwu, MD   1 tablet at 07/02/21 0354  ? ondansetron (ZOFRAN) tablet 4 mg  4 mg Oral Q6H PRN Agbata, Tochukwu, MD      ? Or  ? ondansetron (ZOFRAN) injection 4 mg  4 mg Intravenous Q6H PRN Agbata, Tochukwu, MD      ? QUEtiapine  (SEROQUEL XR) 24 hr tablet 150 mg  150 mg Oral QHS Agbata, Tochukwu, MD   150 mg at 07/01/21 2049  ? sodium chloride flush (NS) 0.9 % injection 3 mL  3 mL Intravenous Q12H Agbata, Tochukwu, MD   3 mL at 07/02/21 0928  ? sodium chloride flush (NS) 0.9 % injection 3 mL  3 mL Intravenous PRN Agbata, Tochukwu, MD      ? Teriflunomide TABS 14 mg  14 mg Oral Daily Agbata, Tochukwu, MD      ? traZODone (DESYREL) tablet 50 mg  50 mg Oral QHS Agbata, Tochukwu, MD   50 mg at 07/01/21 2050  ? vortioxetine HBr (TRINTELLIX) tablet 5 mg  5 mg Oral Daily Agbata, Tochukwu,  MD   5 mg at 07/02/21 7939  ? ?Facility-Administered Medications Ordered in Other Encounters  ?Medication Dose Route Frequency Provider Last Rate Last Admin  ? heparin lock flush 100 unit/mL  500 Units Intracatheter Once PRN Sindy [place]adel[place], MD      ? ? ? ?Discharge Medications: ?Please see discharge summary for a list of discharge medications. ? ?Relevant Imaging Results: ? ?Relevant Lab Results: ? ? ?Additional Information ?SS# [id number] ? ?Conception Oms, RN ? ? ? ? ?

## 2021-07-03 LAB — BASIC METABOLIC PANEL
Anion gap: 9 (ref 5–15)
BUN: 20 mg/dL (ref 8–23)
CO2: 29 mmol/L (ref 22–32)
Calcium: 9.3 mg/dL (ref 8.9–10.3)
Chloride: 100 mmol/L (ref 98–111)
Creatinine, Ser: 1.38 mg/dL — ABNORMAL HIGH (ref 0.44–1.00)
GFR, Estimated: 41 mL/min — ABNORMAL LOW (ref >60)
Glucose, Bld: 101 mg/dL — ABNORMAL HIGH (ref 70–99)
Potassium: 3.8 mmol/L (ref 3.5–5.1)
Sodium: 138 mmol/L (ref 135–145)

## 2021-07-03 NOTE — Assessment & Plan Note (Signed)
Patient with a history of multiple sclerosis who presents to the ER for evaluation after she had multiple falls at home. ?She complains of bilateral lower extremity weakness but more involving the right lower extremity. ?At baseline she is wheelchair-bound but states she is able to ambulate short distances with a rolling walker. ?PT/OT is recommending SNF ?-TOC consult-waiting for insurance authorization ?-Continue with fall precautions ?

## 2021-07-03 NOTE — Plan of Care (Signed)

## 2021-07-03 NOTE — Assessment & Plan Note (Signed)
Patient has pyuria and has a chronic indwelling Foley catheter in place ?Urine culture with multiple species.No urinary symptoms. ?-Stop ABx. ?

## 2021-07-03 NOTE — Assessment & Plan Note (Signed)
Patient has a history of stage III chronic kidney disease ?Renal function appears stable at this time ?We will monitor closely during this hospitalization ?

## 2021-07-03 NOTE — TOC Progression Note (Signed)
Transition of Care (TOC) - Progression Note  ? ? ?Patient Details  ?Name: [name] ?MRN: [id number] ?Date of Birth: 1950/09/13 ? ?Transition of Care (TOC) CM/SW Contact  ?Conception Oms, RN ?Phone Number: ?07/03/2021, 10:47 AM ? ?Clinical Narrative:   Spoke with the patient and reviewed the bed offers, she chose [facility] in [place], I called [facility] and left a message for Irven Shelling Admission Coordinator asking for Ins approval to be started for [employer] ? ? ? ?Expected Discharge Plan: [place] ?Barriers to Discharge: Continued Medical Work up ? ?Expected Discharge Plan and Services ?Expected Discharge Plan: [place] ?  ?Discharge Planning Services: CM Consult ?  ?  ?                ?DME Arranged: N/A ?  ?  ?  ?  ?  ?  ?  ?  ?  ? ? ?Social Determinants of Health (SDOH) Interventions ?  ? ?Readmission Risk Interventions ?Readmission Risk Prevention Plan 03/26/2021 01/03/2020 04/22/2019  ?Transportation Screening Complete Complete Complete  ?PCP or Specialist Appt within 3-5 Days Complete - -  ?[place] or [place] - - -  ?Social Work Consult for [place] Planning/Counseling Complete - -  ?Palliative Care Screening Complete - -  ?Medication Review Press photographer) Complete Complete Referral to Pharmacy  ?PCP or Specialist appointment within 3-5 days of discharge - Complete Complete  ?[place] or Home Care Consult - Complete Complete  ?SW Recovery Care/Counseling Consult - Complete Complete  ?Palliative Care Screening - Not Applicable Not Applicable  ?Skilled Nursing Facility - - Complete  ?Some recent data might be hidden  ? ? ?

## 2021-07-03 NOTE — Progress Notes (Signed)
Called Mr. Wroe, husband, to arrange for him to bring Teriflunomide from home for pharmacy to sort so that patient could continue taking the medications.  Patient has not been able to take the medication since 06/30/21, day of admission, as it is not available for our pharmacy to dispense. Mr. Mccreadie stated that he will visit this evening to bring the medication and will make sure to let RN know. ?

## 2021-07-03 NOTE — Progress Notes (Signed)
?Progress Note ? ? ?Patient: [name] CBJ:[id number] DOB: 06-Aug-1950 DOA: 06/30/2021     2 ?DOS: the patient was seen and examined on 07/03/2021 ?  ?Brief hospital course: ?Taken from prior notes. ? ?[name] is a 71 y.o. female with medical history significant for multiple sclerosis, depression, hypertension, chronic indwelling Foley catheter, COPD, follicular lymphoma who presents to [facility] ED for evaluation following a fall at home.  Endorses she has had multiple falls recently with the last fall being 1 day prior to her admission while in the bathroom.  She is mostly wheelchair-bound but states that she is able to ambulate short distances with her rolling walker.  She presented to the ER with complaints of pain involving her left hip and leg.  ? ?Neurology was consulted for concern of a possible MS flare.  Repeat MRI brain and cervical spine was without any new demyelinating lesions.  Neurology signed off and recommending outpatient follow-up and to consider MS medications. ? ?PT/OT is recommending rehab.  TOC is working on it. ? ?3/7: Patient remained stable, waiting for insurance authorization. ? ? ?Assessment and Plan: ?* Fall ?Patient with a history of multiple sclerosis who presents to the ER for evaluation after she had multiple falls at home. ?She complains of bilateral lower extremity weakness but more involving the right lower extremity. ?At baseline she is wheelchair-bound but states she is able to ambulate short distances with a rolling walker. ?PT/OT is recommending SNF ?-TOC consult-waiting for insurance authorization ?-Continue with fall precautions ? ?Multiple sclerosis ([place]) ?Repeat MRI brain and C-spine was negative for any new lesions.  Neurology signed off. ?-Continue teriflunomide ?-Outpatient follow-up with neurology for further recommendations ? ? ?Foley catheter in place on admission ?Patient has a history of neurogenic bladder secondary to known MS ?She has a chronic indwelling Foley  catheter in place that was last exchanged 6 weeks ago ?Foley catheter was replaced in the ER ?Continued Myrbetriq ? ?UTI (urinary tract infection) ?Patient has pyuria and has a chronic indwelling Foley catheter in place ?Urine culture with multiple species.No urinary symptoms. ?-Stop ABx. ? ?Depression ?Stable ?Continue bupropion, Seroquel and Trintellix ? ?Obesity, Class III, BMI 40-49.9 (morbid obesity) ([place]) ?Patient has a BMI of 47.89 kg/m2 ?Complicates overall prognosis and care ? ?CKD (chronic kidney disease) stage 3, GFR 30-59 ml/min (HCC) ?Patient has a history of stage III chronic kidney disease ?Renal function appears stable at this time ?We will monitor closely during this hospitalization ? ?Essential hypertension ?- Keep Hold lisinopril and hydralazine since patient is normotensive ?-Continue amlodipine ? ? ?  ? ?Subjective: Patient continued to feel weak.  No other complaints. ? ?Physical Exam: ?Vitals:  ? 07/03/21 0041 07/03/21 0403 07/03/21 1607 07/03/21 1222  ?BP: (!) 141/64 132/73 113/70 97/60  ?Pulse: 65 (!) 54 67 (!) 54  ?Resp: '17 16 14 16  '$ ?Temp: 97.9 ?F (36.6 ?C) (!) 97.2 ?F (36.2 ?C) 97.7 ?F (36.5 ?C) 98 ?F (36.7 ?C)  ?TempSrc:      ?SpO2: 92% 97%  96%  ?Weight:      ?Height:      ? ?General.  Well-developed obese lady, in no acute distress. ?Pulmonary.  Lungs clear bilaterally, normal respiratory effort. ?CV.  Regular rate and rhythm, no JVD, rub or murmur. ?Abdomen.  Soft, nontender, nondistended, BS positive. ?CNS.  Alert and oriented .  No focal neurologic deficit. ?Extremities.  No edema, no cyanosis, pulses intact and symmetrical. ?Psychiatry.  Judgment and insight appears normal. ? ?Data  Reviewed: ?I reviewed prior notes and labs. ? ?Family Communication:  ? ?Disposition: ?Status is: Inpatient ?Remains inpatient appropriate because: Waiting for placement, ? ? Planned Discharge Destination: Skilled nursing facility ? ? ?Time spent: 40 minutes ? ?This record has been created using [employer]. Errors have been sought and corrected,but may not always be located. Such creation errors do not reflect on the standard of care. ? ?Author: ?Lorella Nimrod, MD ?07/03/2021 3:06 PM ? ?For on call review [email or web address].  ?

## 2021-07-03 NOTE — Progress Notes (Signed)
Occupational Therapy Treatment ?Patient Details ?Name: [name] ?MRN: [id number] ?DOB: 1951/04/25 ?Today's Date: 07/03/2021 ? ? ?History of present illness Pt admitted to [facility] on 06/30/21 under observation for fall at home in the shower, with c/o L sided leg, arm, and hip pain (imaging negative for fx). Pt endorsed additional fall 1 week ago where she hit her head. MRI brain reveals small chronic infarct within the right cerebellar hemisphere (new from the prior MRI of 01/27/2020). CT thoracic spine reveals chronic T12 compression fracture. Significant PMH includes: multiple sclerosis, small chronic infarct within the left cerebellar hemisphere, depression, hypertension, chronic indwelling Foley catheter, COPD, follicular lymphoma ?  ?OT comments ? Pt seen for OT treatment on this date. Upon arrival to room, pt awake and sitting upright in bed. Pt agreeable to OT tx and reporting no pain. Pt currently requires MOD A for bed mobility, MOD A for stand pivot transfers to/from [facility], and MAX A for sit>stand peri-care d/t decreased balance and activity tolerance. Once seated EOB, pt performed seated UB dressing requiring SUPERVISION/SET-UP only. At end of session, pt left sitting upright in bed, with all needs within reach and in no acute distress. Pt is making good progress toward goals and continues to benefit from skilled OT services to maximize return to PLOF and minimize risk of future falls, injury, caregiver burden, and readmission. Will continue to follow POC. Discharge recommendation remains appropriate.    ? ?Recommendations for follow up therapy are one component of a multi-disciplinary discharge planning process, led by the attending physician.  Recommendations may be updated based on patient status, additional functional criteria and insurance authorization. ?   ?Follow Up Recommendations ? Skilled nursing-short term rehab (<3 hours/day)  ?  ?Assistance Recommended at Discharge Intermittent  Supervision/Assistance  ?Patient can return home with the following ? A lot of help with walking and/or transfers;A lot of help with bathing/dressing/bathroom ?  ?Equipment Recommendations ? Other (comment) (defer to next venue of care)  ?  ?   ?Precautions / Restrictions Precautions ?Precautions: Fall ?Restrictions ?Weight Bearing Restrictions: No  ? ? ?  ? ?Mobility Bed Mobility ?Overal bed mobility: Needs Assistance ?Bed Mobility: Supine to Sit, Sit to Supine ?  ?  ?Supine to sit: Mod assist, HOB elevated ?Sit to supine: Mod assist ?  ?General bed mobility comments: Requires MOD A for BLE ?  ? ?Transfers ?Overall transfer level: Needs assistance ?Equipment used: Rolling walker (2 wheels) ?Transfers: Sit to/from Stand ?Sit to Stand: Mod assist ?Stand pivot transfers: Mod assist ?  ?  ?  ?  ?  ?  ?  ?Balance Overall balance assessment: Needs assistance ?Sitting-balance support: No upper extremity supported, Feet supported ?Sitting balance-Leahy Scale: Fair ?Sitting balance - Comments: supervision sitting EOB and reaching within BOS ?  ?Standing balance support: Bilateral upper extremity supported, During functional activity, Reliant on assistive device for balance ?Standing balance-Leahy Scale: Poor ?Standing balance comment: Requires MIN A for static standing balance ?  ?  ?  ?  ?  ?  ?  ?  ?  ?  ?  ?   ? ?ADL either performed or assessed with clinical judgement  ? ?ADL Overall ADL's : Needs assistance/impaired ?  ?  ?  ?  ?  ?  ?  ?  ?Upper Body Dressing : Supervision/safety;Sitting ?Upper Body Dressing Details (indicate cue type and reason): to don/doff hospital gown ?  ?  ?Toilet Transfer: Moderate assistance;Stand-pivot;BSC/3in1;Rolling walker (2 wheels) (bari-BSC) ?  ?Toileting- Clothing  Manipulation and Hygiene: Maximal assistance;Sit to/from stand ?Toileting - Clothing Manipulation Details (indicate cue type and reason): pt put forth good effort to perform peri-care in standing position, however ultimately  requiring MAX A d/t body habitus ?  ?  ?  ?  ?  ? ? ? ?Cognition Arousal/Alertness: Awake/alert ?Behavior During Therapy: [facility] for tasks assessed/performed ?Overall Cognitive Status: Within Functional Limits for tasks assessed ?  ?  ?  ?  ?  ?  ?  ?  ?  ?  ?  ?  ?  ?  ?  ?  ?  ?  ?  ?   ?   ?   ?   ? ? ?Pertinent Vitals/ Pain       Pain Assessment ?Pain Assessment: No/denies pain ? ?   ?   ? ?Frequency ? Min 2X/week  ? ? ? ? ?  ?Progress Toward Goals ? ?OT Goals(current goals can now be found in the care plan section) ? Progress towards OT goals: Progressing toward goals ? ?Acute Rehab OT Goals ?Patient Stated Goal: to get stronger ?OT Goal Formulation: With patient ?Time For Goal Achievement: 07/15/21 ?Potential to Achieve Goals: Good  ?Plan Discharge plan remains appropriate;Frequency remains appropriate   ? ?   ?AM-PAC OT "6 Clicks" Daily Activity     ?Outcome Measure ? ? Help from another person eating meals?: None ?Help from another person taking care of personal grooming?: A Little ?Help from another person toileting, which includes using toliet, bedpan, or urinal?: A Lot ?Help from another person bathing (including washing, rinsing, drying)?: A Lot ?Help from another person to put on and taking off regular upper body clothing?: A Little ?Help from another person to put on and taking off regular lower body clothing?: A Lot ?6 Click Score: 16 ? ?  ?End of Session Equipment Utilized During Treatment: Gait belt ? ?OT Visit Diagnosis: Unsteadiness on feet (R26.81);Muscle weakness (generalized) (M62.81);Repeated falls (R29.6) ?  ?Activity Tolerance Patient tolerated treatment well ?  ?Patient Left in bed;with call bell/phone within reach;with bed alarm set ?  ?Nurse Communication Mobility status ?  ? ?   ? ?Time: 0981-1914 ?OT Time Calculation (min): 38 min ? ?Charges: OT General Charges ?$OT Visit: 1 Visit ?OT Treatments ?$Self Care/Home Management : 38-52 mins ? ?Fredirick Maudlin, OTR/L ?[place] ? ?

## 2021-07-03 NOTE — Progress Notes (Signed)
PIV removed, no bleeding, intact. MD confirmed ok to keep patient without IV while she waits for SNF bed placement. ?

## 2021-07-04 LAB — RESP PANEL BY RT-PCR (FLU A&B, COVID) ARPGX2
Influenza A by PCR: NEGATIVE
Influenza B by PCR: NEGATIVE
SARS Coronavirus 2 by RT PCR: NEGATIVE

## 2021-07-04 NOTE — Plan of Care (Signed)

## 2021-07-04 NOTE — Progress Notes (Signed)
?Progress Note ? ? ?Patient: [name] YWV:[id number] DOB: [birth year] DOA: 06/30/2021     3 ?DOS: the patient was seen and examined on 07/04/2021 ?  ?Brief hospital course: ?Taken from prior notes. ? ?ABBAGALE GOGUEN is a 71 y.o. female with medical history significant for multiple sclerosis, depression, hypertension, chronic indwelling Foley catheter, COPD, follicular lymphoma who presents to [facility] ED for evaluation following a fall at home.  Endorses she has had multiple falls recently with the last fall being 1 day prior to her admission while in the bathroom.  She is mostly wheelchair-bound but states that she is able to ambulate short distances with her rolling walker.  She presented to the ER with complaints of pain involving her left hip and leg.  ? ?Neurology was consulted for concern of a possible MS flare.  Repeat MRI brain and cervical spine was without any new demyelinating lesions.  Neurology signed off and recommending outpatient follow-up and to consider MS medications. ? ?PT/OT is recommending rehab.  TOC is working on it. ? ?3/7: Patient remained stable, waiting for insurance authorization. ? ? ?Assessment and Plan: ?* Fall ?Patient with a history of multiple sclerosis who presents to the ER for evaluation after she had multiple falls at home. ?She complains of bilateral lower extremity weakness but more involving the right lower extremity. ?At baseline she is wheelchair-bound but states she is able to ambulate short distances with a rolling walker. ?PT/OT is recommending SNF ?-TOC consult-waiting for insurance authorization ?-Continue with fall precautions ? ?Multiple sclerosis ([place]) ?Repeat MRI brain and C-spine was negative for any new lesions.  Neurology signed off. ?-Continue teriflunomide ?-Outpatient follow-up with neurology for further recommendations ? ? ?Foley catheter in place on admission ?Patient has a history of neurogenic bladder secondary to known MS ?She has a chronic indwelling Foley  catheter in place that was last exchanged 6 weeks ago ?Foley catheter was replaced in the ER ?Continued Myrbetriq ? ?UTI (urinary tract infection) ?Patient has pyuria and has a chronic indwelling Foley catheter in place ?Urine culture with multiple species.No urinary symptoms. ?-Stop ABx. ? ?Depression ?Stable ?Continue bupropion, Seroquel and Trintellix ? ?Obesity, Class III, BMI 40-49.9 (morbid obesity) ([place]) ?Patient has a BMI of 47.89 kg/m2 ?Complicates overall prognosis and care ? ?CKD (chronic kidney disease) stage 3, GFR 30-59 ml/min (HCC) ?Patient has a history of stage III chronic kidney disease ?Renal function appears stable at this time ?We will monitor closely during this hospitalization ? ?Essential hypertension ?- Keep Hold lisinopril and hydralazine since patient is normotensive ?-Continue amlodipine ? ? ?Subjective: Patient was seen and examined today.  No new complaints.  Waiting for insurance authorization to go to rehab. ? ?Physical Exam: ?Vitals:  ? 07/04/21 0540 07/04/21 0802 07/04/21 0809 07/04/21 1145  ?BP: (!) 118/58 (!) 113/35 (!) 118/54 117/61  ?Pulse: (!) 56 (!) 54 (!) 53 60  ?Resp: '17 16  14  '$ ?Temp: 98.6 ?F (37 ?C) 98 ?F (36.7 ?C)  98 ?F (36.7 ?C)  ?TempSrc:      ?SpO2: 95% 100%  99%  ?Weight:      ?Height:      ? ?General.  Well-developed, obese lady, in no acute distress. ?Pulmonary.  Lungs clear bilaterally, normal respiratory effort. ?CV.  Regular rate and rhythm, no JVD, rub or murmur. ?Abdomen.  Soft, nontender, nondistended, BS positive. ?CNS.  Alert and oriented .  No focal neurologic deficit. ?Extremities.  No edema, no cyanosis, pulses intact and symmetrical. ?Psychiatry.  Judgment  and insight appears normal. ? ?Data Reviewed: ?Yes ? ?Family Communication:  ? ?Disposition: ?Status is: Inpatient ?Remains inpatient appropriate because: Waiting for insurance authorization to go to rehab ? ? Planned Discharge Destination: Skilled nursing facility ? ?Time spent: 40 minutes ? ?This  record has been created using Systems analyst. Errors have been sought and corrected,but may not always be located. Such creation errors do not reflect on the standard of care. ? ?Author: ?Lorella Nimrod, MD ?07/04/2021 2:08 PM ? ?For on call review [email or web address].  ?

## 2021-07-04 NOTE — TOC Progression Note (Signed)
Transition of Care (TOC) - Progression Note  ? ? ?Patient Details  ?Name: [name] ?MRN: [id number] ?Date of Birth: 09-21-1950 ? ?Transition of Care (TOC) CM/SW Contact  ?Conception Oms, RN ?Phone Number: ?07/04/2021, 11:24 AM ? ?Clinical Narrative:   Spoke with Debbie at [facility] to inquire about the ins Josem Kaufmann, it is pending ? ? ? ?Expected Discharge Plan: [place] ?Barriers to Discharge: Continued Medical Work up ? ?Expected Discharge Plan and Services ?Expected Discharge Plan: [place] ?  ?Discharge Planning Services: CM Consult ?  ?  ?                ?DME Arranged: N/A ?  ?  ?  ?  ?  ?  ?  ?  ?  ? ? ?Social Determinants of Health (SDOH) Interventions ?  ? ?Readmission Risk Interventions ?Readmission Risk Prevention Plan 03/26/2021 01/03/2020 04/22/2019  ?Transportation Screening Complete Complete Complete  ?PCP or Specialist Appt within 3-5 Days Complete - -  ?[place] or [place] - - -  ?Social Work Consult for [place] Planning/Counseling Complete - -  ?Palliative Care Screening Complete - -  ?Medication Review Press photographer) Complete Complete Referral to Pharmacy  ?PCP or Specialist appointment within 3-5 days of discharge - Complete Complete  ?[place] or Home Care Consult - Complete Complete  ?SW Recovery Care/Counseling Consult - Complete Complete  ?Palliative Care Screening - Not Applicable Not Applicable  ?Skilled Nursing Facility - - Complete  ?Some recent data might be hidden  ? ? ?

## 2021-07-05 DIAGNOSIS — F331 Major depressive disorder, recurrent, moderate: Secondary | ICD-10-CM

## 2021-07-05 MED ORDER — GABAPENTIN 400 MG PO CAPS: 400.0000 mg | ORAL_CAPSULE | Freq: Three times a day (TID) | ORAL | Status: DC

## 2021-07-05 MED ORDER — TORSEMIDE 20 MG PO TABS
20.0000 mg | ORAL_TABLET | Freq: Every day | ORAL | Status: DC | PRN
Start: 2021-07-05 — End: 2023-06-10

## 2021-07-05 NOTE — TOC Progression Note (Signed)
Transition of Care (TOC) - Progression Note  ? ? ?Patient Details  ?Name: [name] ?MRN: [id number] ?Date of Birth: 27-Dec-1950 ? ?Transition of Care (TOC) CM/SW Contact  ?Conception Oms, RN ?Phone Number: ?07/05/2021, 9:34 AM ? ?Clinical Narrative:   Reached out to Debbie at [facility] to inquire about Ins auth, Awaiting a response ? ? ? ?Expected Discharge Plan: [place] ?Barriers to Discharge: Continued Medical Work up ? ?Expected Discharge Plan and Services ?Expected Discharge Plan: [place] ?  ?Discharge Planning Services: CM Consult ?  ?  ?                ?DME Arranged: N/A ?  ?  ?  ?  ?  ?  ?  ?  ?  ? ? ?Social Determinants of Health (SDOH) Interventions ?  ? ?Readmission Risk Interventions ?Readmission Risk Prevention Plan 03/26/2021 01/03/2020 04/22/2019  ?Transportation Screening Complete Complete Complete  ?PCP or Specialist Appt within 3-5 Days Complete - -  ?[place] or [place] - - -  ?Social Work Consult for [place] Planning/Counseling Complete - -  ?Palliative Care Screening Complete - -  ?Medication Review Press photographer) Complete Complete Referral to Pharmacy  ?PCP or Specialist appointment within 3-5 days of discharge - Complete Complete  ?[place] or Home Care Consult - Complete Complete  ?SW Recovery Care/Counseling Consult - Complete Complete  ?Palliative Care Screening - Not Applicable Not Applicable  ?Skilled Nursing Facility - - Complete  ?Some recent data might be hidden  ? ? ?

## 2021-07-05 NOTE — Progress Notes (Signed)
Report called to [facility] LPN. ?

## 2021-07-05 NOTE — Care Management Important Message (Signed)
Important Message ? ?Patient Details  ?Name: [name] ?MRN: [id number] ?Date of Birth: [birth year] ? ? ?Medicare Important Message Given:  Yes ? ?Patient is in an isolation room so I reviewed the Important Message from Medicare by phone with her [phone]). She is agreement with the discharge and I wished her well and thanked her for her time. ? ? ?Juliann Pulse A Gaylon Melchor ?07/05/2021, 1:32 PM ?

## 2021-07-05 NOTE — TOC Progression Note (Addendum)
Transition of Care (TOC) - Progression Note  ? ? ?Patient Details  ?Name: [name] ?MRN: [id number] ?Date of Birth: 10-09-1950 ? ?Transition of Care (TOC) CM/SW Contact  ?Conception Oms, RN ?Phone Number: ?07/05/2021, 11:08 AM ? ?Clinical Narrative:   Debbie with [facility] and stated they have approval, they will not be able to accept her until after 2 PM due to no bed until then they would like the DC summary prior to so they can get the medication, I notified the Physician ?I pre arranged [facility] for 330 PM ? ? ?Expected Discharge Plan: [place] ?Barriers to Discharge: Continued Medical Work up ? ?Expected Discharge Plan and Services ?Expected Discharge Plan: [place] ?  ?Discharge Planning Services: CM Consult ?  ?  ?                ?DME Arranged: N/A ?  ?  ?  ?  ?  ?  ?  ?  ?  ? ? ?Social Determinants of Health (SDOH) Interventions ?  ? ?Readmission Risk Interventions ?Readmission Risk Prevention Plan 03/26/2021 01/03/2020 04/22/2019  ?Transportation Screening Complete Complete Complete  ?PCP or Specialist Appt within 3-5 Days Complete - -  ?[place] or [place] - - -  ?Social Work Consult for [place] Planning/Counseling Complete - -  ?Palliative Care Screening Complete - -  ?Medication Review Press photographer) Complete Complete Referral to Pharmacy  ?PCP or Specialist appointment within 3-5 days of discharge - Complete Complete  ?[place] or Home Care Consult - Complete Complete  ?SW Recovery Care/Counseling Consult - Complete Complete  ?Palliative Care Screening - Not Applicable Not Applicable  ?Skilled Nursing Facility - - Complete  ?Some recent data might be hidden  ? ? ?

## 2021-07-05 NOTE — Discharge Summary (Signed)
Physician Discharge Summary   Patient: [name] MRN: [id number] DOB: 03/11/1951  Admit date:     06/30/2021  Discharge date: 07/05/21  Discharge Physician: Lorella Nimrod   PCP: Kirk Ruths, MD   Recommendations at discharge:  Follow-up neurology Follow-up with primary care  Discharge Diagnoses: Principal Problem:   Fall Active Problems:   Multiple sclerosis ([place])   Essential hypertension   CKD (chronic kidney disease) stage 3, GFR 30-59 ml/min (HCC)   Obesity, Class III, BMI 40-49.9 (morbid obesity) ([place])   Depression   UTI (urinary tract infection)   Foley catheter in place on admission  Hospital Course: Taken from prior notes.  [name] is a 71 y.o. female with medical history significant for multiple sclerosis, depression, hypertension, chronic indwelling Foley catheter, COPD, follicular lymphoma who presents to [facility] ED for evaluation following a fall at home.  Endorses she has had multiple falls recently with the last fall being 1 day prior to her admission while in the bathroom.  She is mostly wheelchair-bound but states that she is able to ambulate short distances with her rolling walker.  She presented to the ER with complaints of pain involving her left hip and leg.   Neurology was consulted for concern of a possible MS flare.  Repeat MRI brain and cervical spine was without any new demyelinating lesions.  Neurology signed off and recommending outpatient follow-up and to consider MS medications.  PT/OT is recommending rehab.  TOC is working on it.  3/7: Patient remained stable, waiting for insurance authorization.  Patient remained stable, she is being discharged to rehab for further management Patient to have a close follow-up with her neurologist for further recommendations-start her on MS medications.  Assessment and Plan: * Fall Patient with a history of multiple sclerosis who presents to the ER for evaluation after she had multiple falls at  home. She complains of bilateral lower extremity weakness but more involving the right lower extremity. At baseline she is wheelchair-bound but states she is able to ambulate short distances with a rolling walker. PT/OT is recommending SNF -TOC consult-waiting for insurance authorization -Continue with fall precautions  Multiple sclerosis ([place]) Repeat MRI brain and C-spine was negative for any new lesions.  Neurology signed off. -Continue teriflunomide -Outpatient follow-up with neurology for further recommendations   Foley catheter in place on admission Patient has a history of neurogenic bladder secondary to known MS She has a chronic indwelling Foley catheter in place that was last exchanged 6 weeks ago Foley catheter was replaced in the ER Continued Myrbetriq  UTI (urinary tract infection) Patient has pyuria and has a chronic indwelling Foley catheter in place Urine culture with multiple species.No urinary symptoms. -Stop ABx.  Depression Stable Continue bupropion, Seroquel and Trintellix  Obesity, Class III, BMI 40-49.9 (morbid obesity) ([place]) Patient has a BMI of 09.62 kg/m2 Complicates overall prognosis and care  CKD (chronic kidney disease) stage 3, GFR 30-59 ml/min (HCC) Patient has a history of stage III chronic kidney disease Renal function appears stable at this time We will monitor closely during this hospitalization  Essential hypertension - Keep Hold lisinopril and hydralazine since patient is normotensive -Continue amlodipine   Consultants: Neurology Procedures performed: None Disposition: Skilled nursing facility Diet recommendation: Cardiac diet Discharge Diet Orders (From admission, onward)     Start     Ordered   07/05/21 0000  Diet - low sodium heart healthy        07/05/21 1118  DISCHARGE MEDICATION: Allergies as of 07/05/2021   No Known Allergies      Medication List     STOP taking these medications    amLODipine 5 MG  tablet Commonly known as: NORVASC   cyanocobalamin 1000 MCG tablet   gabapentin 600 MG tablet Commonly known as: NEURONTIN Replaced by: gabapentin 400 MG capsule   hydrALAZINE 50 MG tablet Commonly known as: APRESOLINE       TAKE these medications    acetaminophen 500 MG tablet Commonly known as: TYLENOL Take 1,000 mg by mouth every 8 (eight) hours as needed for moderate pain.   atorvastatin 10 MG tablet Commonly known as: LIPITOR Take 10 mg by mouth at bedtime.   budesonide-formoterol 160-4.5 MCG/ACT inhaler Commonly known as: SYMBICORT Inhale 2 puffs into the lungs 2 (two) times daily.   buPROPion 300 MG 24 hr tablet Commonly known as: WELLBUTRIN XL Take 300 mg by mouth daily.   clonazePAM 0.5 MG tablet Commonly known as: KLONOPIN Take 1 tablet (0.5 mg total) by mouth 2 (two) times daily.   docusate sodium 100 MG capsule Commonly known as: COLACE Take 1 capsule (100 mg total) by mouth 2 (two) times daily as needed.   DULoxetine 60 MG capsule Commonly known as: CYMBALTA Take 1 capsule (60 mg total) by mouth every morning. What changed: when to take this   gabapentin 400 MG capsule Commonly known as: NEURONTIN Take 1 capsule (400 mg total) by mouth 3 (three) times daily. Replaces: gabapentin 600 MG tablet   lisinopril 20 MG tablet Commonly known as: ZESTRIL Take 0.5 tablets (10 mg total) by mouth daily.   magnesium oxide 400 MG tablet Commonly known as: MAG-OX Take 400 mg by mouth daily.   multivitamin with minerals Tabs tablet Take 1 tablet by mouth daily.   Myrbetriq 50 MG Tb24 tablet Generic drug: mirabegron ER TAKE ONE TABLET BY MOUTH ONCE DAILY   polyethylene glycol 17 g packet Commonly known as: MIRALAX / GLYCOLAX Take 17 g by mouth daily as needed for mild constipation.   QUEtiapine Fumarate 150 MG 24 hr tablet Commonly known as: SEROQUEL XR Take 150 mg by mouth at bedtime.   saccharomyces boulardii 250 MG capsule Commonly known as:  FLORASTOR Take 1 capsule (250 mg total) by mouth 2 (two) times daily.   Teriflunomide 14 MG Tabs Take 14 mg by mouth daily.   torsemide 20 MG tablet Commonly known as: DEMADEX Take 1 tablet (20 mg total) by mouth daily as needed (edema). What changed:  when to take this reasons to take this   traZODone 50 MG tablet Commonly known as: DESYREL Take 50 mg by mouth at bedtime.   vortioxetine HBr 5 MG Tabs tablet Commonly known as: TRINTELLIX Take 5 mg by mouth daily.               Discharge Care Instructions  (From admission, onward)           Start     Ordered   07/05/21 0000  No dressing needed        07/05/21 1118            Contact information for follow-up providers     Kirk Ruths, MD. Schedule an appointment as soon as possible for a visit in 1 week(s).   Specialty: Internal Medicine Contact information: [place] [phone]              Contact information for after-discharge care  [facility] .   Service: Skilled Nursing Contact information: [address] [phone]                    Discharge Exam: Danley Danker Weights   06/30/21 0058  Weight: 126.6 kg   General.  Obese elderly in no acute distress. Pulmonary.  Lungs clear bilaterally, normal respiratory effort. CV.  Regular rate and rhythm, no JVD, rub or murmur. Abdomen.  Soft, nontender, nondistended, BS positive. CNS.  Alert and oriented .  No focal neurologic deficit. Extremities.  No edema, no cyanosis, pulses intact and symmetrical. Psychiatry.  Judgment and insight appears normal.   Condition at discharge: stable  The results of significant diagnostics from this hospitalization (including imaging, microbiology, ancillary and laboratory) are listed below for reference.   Imaging Studies: CT HEAD WO CONTRAST (5MM)  Result  Date: 06/30/2021 CLINICAL DATA:  Fall EXAM: CT HEAD WITHOUT CONTRAST CT CERVICAL SPINE WITHOUT CONTRAST TECHNIQUE: Multidetector CT imaging of the head and cervical spine was performed following the standard protocol without intravenous contrast. Multiplanar CT image reconstructions of the cervical spine were also generated. RADIATION DOSE REDUCTION: This exam was performed according to the departmental dose-optimization program which includes automated exposure control, adjustment of the mA and/or kV according to patient size and/or use of iterative reconstruction technique. COMPARISON:  None. FINDINGS: CT HEAD FINDINGS Brain: There is no mass, hemorrhage or extra-axial collection. There is generalized atrophy without lobar predilection. There is hypoattenuation of the periventricular white matter, most commonly indicating chronic ischemic microangiopathy. Vascular: No abnormal hyperdensity of the major intracranial arteries or dural venous sinuses. No intracranial atherosclerosis. Skull: The visualized skull base, calvarium and extracranial soft tissues are normal. Sinuses/Orbits: No fluid levels or advanced mucosal thickening of the visualized paranasal sinuses. No mastoid or middle ear effusion. The orbits are normal. CT CERVICAL SPINE FINDINGS Alignment: No static subluxation. Facets are aligned. Occipital condyles are normally positioned. Skull base and vertebrae: No acute fracture. Soft tissues and spinal canal: No prevertebral fluid or swelling. No visible canal hematoma. Disc levels: No advanced spinal canal or neural foraminal stenosis. Upper chest: No pneumothorax, pulmonary nodule or pleural effusion. Other: Normal visualized paraspinal cervical soft tissues. IMPRESSION: 1. Chronic ischemic microangiopathy and generalized atrophy without acute intracranial abnormality. 2. No acute fracture or static subluxation of the cervical spine. Electronically Signed   By: Ulyses Jarred M.D.   On: 06/30/2021 02:07    CT Cervical Spine Wo Contrast  Result Date: 06/30/2021 CLINICAL DATA:  Fall EXAM: CT HEAD WITHOUT CONTRAST CT CERVICAL SPINE WITHOUT CONTRAST TECHNIQUE: Multidetector CT imaging of the head and cervical spine was performed following the standard protocol without intravenous contrast. Multiplanar CT image reconstructions of the cervical spine were also generated. RADIATION DOSE REDUCTION: This exam was performed according to the departmental dose-optimization program which includes automated exposure control, adjustment of the mA and/or kV according to patient size and/or use of iterative reconstruction technique. COMPARISON:  None. FINDINGS: CT HEAD FINDINGS Brain: There is no mass, hemorrhage or extra-axial collection. There is generalized atrophy without lobar predilection. There is hypoattenuation of the periventricular white matter, most commonly indicating chronic ischemic microangiopathy. Vascular: No abnormal hyperdensity of the major intracranial arteries or dural venous sinuses. No intracranial atherosclerosis. Skull: The visualized skull base, calvarium and extracranial soft tissues are normal. Sinuses/Orbits: No fluid levels or advanced mucosal thickening of the visualized paranasal sinuses. No mastoid or middle ear  effusion. The orbits are normal. CT CERVICAL SPINE FINDINGS Alignment: No static subluxation. Facets are aligned. Occipital condyles are normally positioned. Skull base and vertebrae: No acute fracture. Soft tissues and spinal canal: No prevertebral fluid or swelling. No visible canal hematoma. Disc levels: No advanced spinal canal or neural foraminal stenosis. Upper chest: No pneumothorax, pulmonary nodule or pleural effusion. Other: Normal visualized paraspinal cervical soft tissues. IMPRESSION: 1. Chronic ischemic microangiopathy and generalized atrophy without acute intracranial abnormality. 2. No acute fracture or static subluxation of the cervical spine. Electronically Signed   By:  Ulyses Jarred M.D.   On: 06/30/2021 02:07   MR BRAIN W WO CONTRAST  Result Date: 07/01/2021 CLINICAL DATA:  Multiple sclerosis.  Possible new exacerbation. EXAM: MRI HEAD WITHOUT AND WITH CONTRAST TECHNIQUE: Multiplanar, multiecho pulse sequences of the brain and surrounding structures were obtained without and with intravenous contrast. CONTRAST:  91m GADAVIST GADOBUTROL 1 MMOL/ML IV SOLN COMPARISON:  MRI 06/30/2021.  Brain MRI 01/27/2020. FINDINGS: Mild intermittent motion degradation. Brain: Moderate generalized cerebral atrophy, unchanged. Advanced multifocal T2 FLAIR hyperintense signal abnormality within the cerebral white matter is similar as compared to the prior brain MRI examinations of 06/30/2021 and 01/27/2020. In addition to deep periventricular involvement there are also juxtacortical lesions. No pathologic intracranial enhancement is demonstrated to suggest active demyelination. Unchanged possible small chronic lacunar infarct within the left thalamus (series 11, image 12). Redemonstrated punctate chronic microhemorrhage within the genu of the left internal capsule. Redemonstrated small chronic infarcts within the bilateral cerebellar hemispheres. There is no acute infarct. No evidence of an intracranial mass. No extra-axial fluid collection. No midline shift. Vascular: Maintained flow voids within the proximal large arterial vessels. Skull and upper cervical spine: No focal suspicious marrow lesion. Sinuses/Orbits: Visualized orbits show no acute finding. Trace mucosal thickening and fluid within the bilateral ethmoid sinuses. Mild mucosal thickening within the right maxillary sinus at the imaged levels. Other: Small volume fluid within the left mastoid air cells. IMPRESSION: Mildly motion degraded exam. Advanced multifocal T2 FLAIR hyperintense signal abnormality within the cerebral white matter, similar as compared to the prior brain MRI examinations of 06/30/2021 and 01/27/2020. Findings are  compatible with the provided history of multiple sclerosis. No pathologic intracranial enhancement to suggest active demyelination. Unchanged possible small chronic lacunar infarct within the left thalamus. Redemonstrated small chronic infarcts within the bilateral cerebellar hemispheres. Moderate generalized cerebral atrophy. Paranasal sinus disease at the imaged levels, as described. Small-volume fluid within the left mastoid air cells. Electronically Signed   By: KKellie SimmeringD.O.   On: 07/01/2021 18:50   MR Brain W and Wo Contrast  Result Date: 06/30/2021 CLINICAL DATA:  Multiple sclerosis.  Fall. EXAM: MRI HEAD WITHOUT AND WITH CONTRAST TECHNIQUE: Multiplanar, multiecho pulse sequences of the brain and surrounding structures were obtained without and with intravenous contrast. CONTRAST:  127mGADAVIST GADOBUTROL 1 MMOL/ML IV SOLN COMPARISON:  Head CT 06/30/2021. Prior brain MRI examinations 01/27/2020 and earlier. FINDINGS: The examination is significantly motion degraded, limiting evaluation. Most notably, there is severe motion degradation of the sagittal T1 weighted sequence, moderate motion degradation of the sagittal T2 FLAIR sequence, moderate/severe motion degradation of the axial T2 TSE sequence, moderate motion degradation of the axial SWI sequence, moderate motion degradation of the axial T1 weighted sequence, moderate motion degradation of the coronal T2 TSE sequence, and moderate/severe motion degradation of the T1 weighted postcontrast sequences. Brain: Moderate generalized cerebral atrophy. Advanced patchy and confluent T2 FLAIR hyperintense signal abnormality within the cerebral white  matter is similar as compared to the prior brain MRI of 01/27/2020. In addition to involvement of the deep periventricular white matter, there are also juxtacortical lesions. Findings are compatible with the provided history of multiple sclerosis. No definitively new lesion is identified. Within the limitations  of motion degradation, no pathologic intracranial enhancement is demonstrated to suggest active demyelination. Unchanged chronic microhemorrhage at the genu of the corpus callosum. Redemonstrated small chronic infarct within the left cerebellar hemisphere. A small chronic infarct within the right cerebellar hemisphere is new from the prior exam. There is no acute infarct. No evidence of an intracranial mass. No extra-axial fluid collection. No midline shift. Vascular: Maintained flow voids within the proximal large arterial vessels. Skull and upper cervical spine: No focal suspicious marrow lesion. Sinuses/Orbits: Visualized orbits show no acute finding. Minimal mucosal thickening within the bilateral ethmoid sinuses. Other: Left mastoid effusion. IMPRESSION: Significantly motion degraded examination, limiting evaluation. Unchanged extensive cerebral white matter disease consistent with the provided history of multiple sclerosis. Within described limitations, no pathologic intracranial enhancement is identified to suggest active demyelination. Redemonstrated small chronic infarct within the left cerebellar hemisphere. A small chronic infarct within the right cerebellar hemisphere is new from the prior MRI of 01/27/2020 Stable moderate generalized cerebral atrophy. Electronically Signed   By: Kellie Simmering D.O.   On: 06/30/2021 12:16   MR CERVICAL SPINE W WO CONTRAST  Result Date: 07/01/2021 CLINICAL DATA:  Multiple sclerosis.  Possible new exacerbations. EXAM: MRI CERVICAL SPINE WITHOUT AND WITH CONTRAST TECHNIQUE: Multiplanar and multiecho pulse sequences of the cervical spine, to include the craniocervical junction and cervicothoracic junction, were obtained without and with intravenous contrast. CONTRAST:  29m GADAVIST GADOBUTROL 1 MMOL/ML IV SOLN COMPARISON:  MRI examination dated November 09, 2018 FINDINGS: Alignment: Mild anterolisthesis of C5, C6 and C7, unchanged. Vertebrae: No fracture, evidence of discitis,  or bone lesion. Cord: Normal signal and morphology. Evaluation is however somewhat limited on post-contrast sequences. Posterior Fossa, vertebral arteries, paraspinal tissues: Negative. Disc levels: C2-3: No significant disc bulge. No neural foraminal stenosis. No central canal stenosis. C3-4: Mild disc bulge. No neural foraminal stenosis. No central canal stenosis. Mild facet joint arthropathy. C4-5: Moderate disc bulge. Uncovertebral joint arthropathy. Mild left and moderate right neural foraminal narrowing. C5-6: Mild anterolisthesis. Mild disc bulge. No neural foraminal stenosis. No central canal stenosis. C6-7: Mild anterolisthesis. No significant disc bulge. No neural foraminal stenosis. No central canal stenosis. C7-T1: Mild anterolisthesis. Mild disc bulge without significant central canal stenosis. IMPRESSION: 1. No evidence of cord lesion, evaluation is however limited due to motion. 2. Mild to moderate multilevel degenerate disc disease with multilevel uncovertebral joint arthropathy, most prominent at C4-C5 and C5-C6. Electronically Signed   By: IKeane PoliceD.O.   On: 07/01/2021 18:57   MR THORACIC SPINE W WO CONTRAST  Result Date: 06/30/2021 CLINICAL DATA:  71year old female with multiple sclerosis. Follicular lymphoma. Multiple recent falls. Wheelchair bound. Pain radiating to the left hip and leg. EXAM: MRI THORACIC WITHOUT AND WITH CONTRAST TECHNIQUE: Multiplanar and multiecho pulse sequences of the thoracic spine were obtained without and with intravenous contrast. CONTRAST:  146mGADAVIST GADOBUTROL 1 MMOL/ML IV SOLN COMPARISON:  Cervical spine MRI 11/09/2018. CT Chest, Abdomen, and Pelvis 03/16/2020. FINDINGS: Limited cervical spine imaging: Stable with mild chronic anterolisthesis of C7 on T1. Thoracic spine segmentation:  Appears to be normal. Alignment: Mildly exaggerated thoracic kyphosis, stable since 2021. No thoracic spondylolisthesis. Vertebrae: Moderate chronic T12 compression  fracture appears stable since 2021, associated partial  obliteration of the T11-T12 disc with interbody and bilateral facet ankylosis as seen by CT in 2021. Faint degenerative endplate marrow edema and enhancement associated with T9 inferior endplate Schmorl's node. No other marrow edema or acute osseous abnormality. Normal background bone marrow signal. Cord: Capacious spinal canal at most levels. Generalized decreased thoracic spinal cord volume. Scattered small areas of heterogeneous T2 and STIR hyperintensity (such as the ventral left hemicord at T6 series 22, image 18), compatible with multifocal chronic cord demyelination. No cord expansion. No abnormal intradural enhancement is evident. No dural thickening. Conus medullaris below T12, conus medullaris at T12-L1, see lumbar MRI today reported separately. Paraspinal and other soft tissues: Partially visible chronic postoperative changes to the paraspinal soft tissues beginning at the thoracolumbar junction. Confluent chronic subcutaneous granulation tissue there. Otherwise negative. Disc levels: Capacious thoracic spinal canal with no age advanced thoracic spine degeneration above the T10-T11 level. T10-T11: Right paracentral disc protrusion and/or endplate spurring with mild to moderate facet hypertrophy. Trace degenerative facet joint fluid. No spinal stenosis, in part related to chronic cord atrophy. Moderate to severe left T10 foraminal stenosis. T11-T12: Interbody ankylosis on the left with mild endplate spurring. Bilateral facet ankylosis better demonstrated by CT in 2021. Mild osseous left T11 foraminal stenosis. T12-L1:  Reported on the lumbar spine separately. IMPRESSION: 1. Evidence of chronic thoracic spinal cord demyelination and atrophy. No acute finding in the thoracic spine. 2. Chronic T12 compression fracture with partial T11-T12 ankylosis. Adjacent segment disease at T10-T11 with moderate to severe left T10 foraminal stenosis but no significant  spinal stenosis. 3. No other age advanced thoracic spine degeneration. See also Lumbar spine MRI reported separately. Electronically Signed   By: Genevie Ann M.D.   On: 06/30/2021 12:19   MR Lumbar Spine W Wo Contrast  Result Date: 06/30/2021 CLINICAL DATA:  72 year old female with multiple sclerosis. Follicular lymphoma. Multiple recent falls. Wheelchair bound. Pain radiating to the left hip and leg. EXAM: MRI LUMBAR SPINE WITHOUT AND WITH CONTRAST TECHNIQUE: Multiplanar and multiecho pulse sequences of the lumbar spine were obtained without and with intravenous contrast. CONTRAST:  68m GADAVIST GADOBUTROL 1 MMOL/ML IV SOLN COMPARISON:  Thoracic spine MRI today reported separately. Lumbar MRI 06/05/2007. CT Abdomen and Pelvis 03/16/2020. FINDINGS: Segmentation: Normal, concordant with the thoracic spine numbering today. Alignment: Stable since 2021. Chronic levoconvex lumbar scoliosis and mildly exaggerated lumbar lordosis. Vertebrae: Chronic hardware susceptibility artifact L3 through S1. Chronic T12 compression fracture. Pronounced chronic endplate changes also at L1-L2 and L2-L3. No convincing marrow edema or acute osseous abnormality. Intact visible sacrum. Conus medullaris and cauda equina: Conus extends to the T12-L1 level. Grossly normal conus. Lower thoracic spinal cord atrophy and heterogeneity reported with the thoracic spine today. No abnormal intradural enhancement. No dural thickening identified. Paraspinal and other soft tissues: Chronic postoperative changes to the lumbar paraspinal soft tissues. Visible abdominal viscera are stable since 2021. Disc levels: T12-L1: Chronic severe disc space loss in the setting of chronic T12 compression fracture and T11-T12 partial ankylosis. Trace T2 and STIR hyperintensity within the disc space but no endplate or paraspinal inflammation. Disc osteophyte complex and up to moderate posterior element hypertrophy with previous partial decompression. No significant  spinal stenosis here at the conus. Chronic severe left T12 foraminal stenosis. L1-L2: Severe disc and endplate degeneration. Circumferential disc osteophyte complex with previous posterior decompression. Up to moderate residual posterior element hypertrophy. No significant spinal stenosis. Moderate to severe bilateral chronic L1 foraminal stenosis. L2-L3: Complete loss of the disc space. Bulky  circumferential disc osteophyte complex. Previous posterior decompression. No significant spinal stenosis. Moderate to severe left and severe right L2 neural foraminal stenosis also appears to be chronic. L3-L4: Prior decompression and fusion. Some evidence of solid arthrodesis here in 2021. No spinal stenosis. Chronic osseous foraminal stenosis is mild on the left, at least moderate on the right. L4-L5: Prior decompression and fusion. Endplate and posterior element hypertrophy greater on the left. No spinal stenosis. Severe left L4 foraminal stenosis. L5-S1: Unilateral posterior fusion hardware here. Prior decompression. No spinal stenosis but severe left and mild right L5 foraminal stenosis which appear chronic. IMPRESSION: 1. No acute finding in the lumbar spine. Chronic T12 compression fracture, previous lumbar decompression and fusion, chronic severe underlying lumbar degeneration. 2. No significant spinal stenosis. But widespread chronic moderate and severe neural foraminal stenosis T12 through L5, sparing only the L3 and right L5 nerve levels. 3. Conus medullaris within normal limits. Thoracic spinal cord reported separately. Electronically Signed   By: Genevie Ann M.D.   On: 06/30/2021 12:29   DG Knee Complete 4 Views Right  Result Date: 06/30/2021 CLINICAL DATA:  Fall today with right knee pain. EXAM: RIGHT KNEE - COMPLETE 4+ VIEW COMPARISON:  None. FINDINGS: The bones are subjectively under mineralized. No acute fracture or dislocation. Tricompartmental osteoarthritis most prominently affecting the medial tibiofemoral  compartment. There is chondrocalcinosis. Minimal knee joint effusion. Generalized soft tissue edema. IMPRESSION: 1. No acute fracture or subluxation. 2. Tricompartmental osteoarthritis and chondrocalcinosis. 3. Minimal knee joint effusion. Electronically Signed   By: Keith Rake M.D.   On: 06/30/2021 01:41   DG Hip Unilat  With Pelvis 2-3 Views Right  Result Date: 06/30/2021 CLINICAL DATA:  Post fall with hip pain. Pain of right knee and hip. EXAM: DG HIP (WITH OR WITHOUT PELVIS) 2-3V RIGHT COMPARISON:  None. FINDINGS: Bones are under mineralized. Soft tissue attenuation from habitus further limits assessment. Allowing for this, no fracture of the pelvis or right hip. Mild right hip degenerative change. Pubic rami are intact. Pubic symphysis and sacroiliac joints are congruent. IMPRESSION: No fracture of the pelvis or right hip. Electronically Signed   By: Keith Rake M.D.   On: 06/30/2021 01:40    Microbiology: Results for orders placed or performed during the hospital encounter of 06/30/21  Urine Culture     Status: Abnormal   Collection Time: 06/30/21  6:51 AM   Specimen: Urine, Catheterized  Result Value Ref Range Status   Specimen Description   Final    URINE, CATHETERIZED Performed at [facility], [address], [place], [place] [address]    Special Requests   Final    NONE Performed at [facility], [address] [place], [place] [address]    Culture (A)  Final    60,000 COLONIES/mL MULTIPLE SPECIES PRESENT, SUGGEST RECOLLECTION   Report Status 07/01/2021 FINAL  Final  Resp Panel by RT-PCR (Flu A&B, Covid) Nasopharyngeal Swab     Status: None   Collection Time: 06/30/21  8:00 AM   Specimen: Nasopharyngeal Swab; Nasopharyngeal(NP) swabs in vial transport medium  Result Value Ref Range Status   SARS Coronavirus 2 by RT PCR NEGATIVE NEGATIVE Final    Comment: (NOTE) SARS-CoV-2 target nucleic acids are NOT DETECTED.  The SARS-CoV-2 RNA is generally  detectable in upper respiratory specimens during the acute phase of infection. The lowest concentration of SARS-CoV-2 viral copies this assay can detect is 138 copies/mL. A negative result does not preclude SARS-Cov-2 infection and should not  be used as the sole basis for treatment or other patient management decisions. A negative result may occur with  improper specimen collection/handling, submission of specimen other than nasopharyngeal swab, presence of viral mutation(s) within the areas targeted by this assay, and inadequate number of viral copies(<138 copies/mL). A negative result must be combined with clinical observations, patient history, and epidemiological information. The expected result is Negative.  Fact Sheet for Patients:  EntrepreneurPulse.com.au  Fact Sheet for Healthcare Providers:  IncredibleEmployment.be  This test is no t yet approved or cleared by the [place] FDA and  has been authorized for detection and/or diagnosis of SARS-CoV-2 by FDA under an Emergency Use Authorization (EUA). This EUA will remain  in effect (meaning this test can be used) for the duration of the COVID-19 declaration under Section 564(b)(1) of the Act, 21 U.S.C.section 360bbb-3(b)(1), unless the authorization is terminated  or revoked sooner.       Influenza A by PCR NEGATIVE NEGATIVE Final   Influenza B by PCR NEGATIVE NEGATIVE Final    Comment: (NOTE) The Xpert Xpress SARS-CoV-2/FLU/RSV plus assay is intended as an aid in the diagnosis of influenza from Nasopharyngeal swab specimens and should not be used as a sole basis for treatment. Nasal washings and aspirates are unacceptable for Xpert Xpress SARS-CoV-2/FLU/RSV testing.  Fact Sheet for Patients: EntrepreneurPulse.com.au  Fact Sheet for Healthcare Providers: IncredibleEmployment.be  This test is not yet approved or cleared by the [place] FDA  and has been authorized for detection and/or diagnosis of SARS-CoV-2 by FDA under an Emergency Use Authorization (EUA). This EUA will remain in effect (meaning this test can be used) for the duration of the COVID-19 declaration under Section 564(b)(1) of the Act, 21 U.S.C. section 360bbb-3(b)(1), unless the authorization is terminated or revoked.  Performed at [facility], [address] [place], [place] [address]   Resp Panel by RT-PCR (Flu A&B, Covid) Nasopharyngeal Swab     Status: None   Collection Time: 07/04/21  2:07 PM   Specimen: Nasopharyngeal Swab; Nasopharyngeal(NP) swabs in vial transport medium  Result Value Ref Range Status   SARS Coronavirus 2 by RT PCR NEGATIVE NEGATIVE Final    Comment: (NOTE) SARS-CoV-2 target nucleic acids are NOT DETECTED.  The SARS-CoV-2 RNA is generally detectable in upper respiratory specimens during the acute phase of infection. The lowest concentration of SARS-CoV-2 viral copies this assay can detect is 138 copies/mL. A negative result does not preclude SARS-Cov-2 infection and should not be used as the sole basis for treatment or other patient management decisions. A negative result may occur with  improper specimen collection/handling, submission of specimen other than nasopharyngeal swab, presence of viral mutation(s) within the areas targeted by this assay, and inadequate number of viral copies(<138 copies/mL). A negative result must be combined with clinical observations, patient history, and epidemiological information. The expected result is Negative.  Fact Sheet for Patients:  EntrepreneurPulse.com.au  Fact Sheet for Healthcare Providers:  IncredibleEmployment.be  This test is no t yet approved or cleared by the [place] FDA and  has been authorized for detection and/or diagnosis of SARS-CoV-2 by FDA under an Emergency Use Authorization (EUA). This EUA will remain  in effect  (meaning this test can be used) for the duration of the COVID-19 declaration under Section 564(b)(1) of the Act, 21 U.S.C.section 360bbb-3(b)(1), unless the authorization is terminated  or revoked sooner.       Influenza A by PCR NEGATIVE NEGATIVE Final   Influenza B by PCR NEGATIVE  NEGATIVE Final    Comment: (NOTE) The Xpert Xpress SARS-CoV-2/FLU/RSV plus assay is intended as an aid in the diagnosis of influenza from Nasopharyngeal swab specimens and should not be used as a sole basis for treatment. Nasal washings and aspirates are unacceptable for Xpert Xpress SARS-CoV-2/FLU/RSV testing.  Fact Sheet for Patients: EntrepreneurPulse.com.au  Fact Sheet for Healthcare Providers: IncredibleEmployment.be  This test is not yet approved or cleared by the [place] FDA and has been authorized for detection and/or diagnosis of SARS-CoV-2 by FDA under an Emergency Use Authorization (EUA). This EUA will remain in effect (meaning this test can be used) for the duration of the COVID-19 declaration under Section 564(b)(1) of the Act, 21 U.S.C. section 360bbb-3(b)(1), unless the authorization is terminated or revoked.  Performed at [facility], [address] [place], [place] [address]     Labs: CBC: Recent Labs  Lab 06/30/21 0118 07/01/21 0503 07/02/21 0638  WBC 8.5 5.7 6.3  HGB 10.5* 10.0* 10.1*  HCT 34.2* 31.0* 31.9*  MCV 87.0 84.9 85.1  PLT 211 207 882   Basic Metabolic Panel: Recent Labs  Lab 06/30/21 0118 07/01/21 0503 07/02/21 0638 07/03/21 0905  NA 135 136 138 138  K 3.9 4.0 3.7 3.8  CL 101 102 101 100  CO2 '28 24 28 29  '$ GLUCOSE 107* 90 96 101*  BUN 24* 24* 25* 20  CREATININE 1.51* 1.51* 1.44* 1.38*  CALCIUM 9.3 8.8* 9.0 9.3  MG  --   --  1.9  --   PHOS  --   --  4.2   4.1  --    Liver Function Tests: Recent Labs  Lab 06/30/21 0118 07/02/21 0638  AST 16  --   ALT 13  --   ALKPHOS 68  --   BILITOT 0.4   --   PROT 6.9  --   ALBUMIN 4.0 3.4*   CBG: Recent Labs  Lab 07/01/21 1324  GLUCAP 115*    Discharge time spent: greater than 30 minutes.  This record has been created using Systems analyst. Errors have been sought and corrected,but may not always be located. Such creation errors do not reflect on the standard of care.   Signed: Lorella Nimrod, MD [facility] 07/05/2021

## 2021-12-22 ENCOUNTER — Emergency Department: Payer: Medicare HMO

## 2021-12-22 ENCOUNTER — Inpatient Hospital Stay
Admission: EM | Admit: 2021-12-22 | Discharge: 2021-12-26 | DRG: 264 | Disposition: A | Discharge status: Home Health | Payer: Medicare HMO | Attending: Internal Medicine | Admitting: Internal Medicine

## 2021-12-22 ENCOUNTER — Observation Stay: Payer: Medicare HMO

## 2021-12-22 ENCOUNTER — Other Ambulatory Visit: Payer: Self-pay

## 2021-12-22 DIAGNOSIS — C8293 Follicular lymphoma, unspecified, intra-abdominal lymph nodes: Secondary | ICD-10-CM | POA: Diagnosis present

## 2021-12-22 DIAGNOSIS — I10 Essential (primary) hypertension: Secondary | ICD-10-CM | POA: Diagnosis present

## 2021-12-22 DIAGNOSIS — G35 Multiple sclerosis: Secondary | ICD-10-CM | POA: Diagnosis present

## 2021-12-22 DIAGNOSIS — L89629 Pressure ulcer of left heel, unspecified stage: Secondary | ICD-10-CM | POA: Diagnosis not present

## 2021-12-22 DIAGNOSIS — I129 Hypertensive chronic kidney disease with stage 1 through stage 4 chronic kidney disease, or unspecified chronic kidney disease: Secondary | ICD-10-CM | POA: Diagnosis present

## 2021-12-22 DIAGNOSIS — N179 Acute kidney failure, unspecified: Secondary | ICD-10-CM | POA: Diagnosis present

## 2021-12-22 DIAGNOSIS — F909 Attention-deficit hyperactivity disorder, unspecified type: Secondary | ICD-10-CM | POA: Diagnosis present

## 2021-12-22 DIAGNOSIS — S91302A Unspecified open wound, left foot, initial encounter: Secondary | ICD-10-CM | POA: Diagnosis present

## 2021-12-22 DIAGNOSIS — F3341 Major depressive disorder, recurrent, in partial remission: Secondary | ICD-10-CM | POA: Diagnosis present

## 2021-12-22 DIAGNOSIS — Z8249 Family history of ischemic heart disease and other diseases of the circulatory system: Secondary | ICD-10-CM

## 2021-12-22 DIAGNOSIS — Z6841 Body Mass Index (BMI) 40.0 and over, adult: Secondary | ICD-10-CM

## 2021-12-22 DIAGNOSIS — Z825 Family history of asthma and other chronic lower respiratory diseases: Secondary | ICD-10-CM

## 2021-12-22 DIAGNOSIS — I70244 Atherosclerosis of native arteries of left leg with ulceration of heel and midfoot: Principal | ICD-10-CM | POA: Diagnosis present

## 2021-12-22 DIAGNOSIS — I700 Atherosclerosis of aorta: Secondary | ICD-10-CM | POA: Diagnosis present

## 2021-12-22 DIAGNOSIS — N1831 Chronic kidney disease, stage 3a: Secondary | ICD-10-CM | POA: Diagnosis present

## 2021-12-22 DIAGNOSIS — Z79891 Long term (current) use of opiate analgesic: Secondary | ICD-10-CM

## 2021-12-22 DIAGNOSIS — F419 Anxiety disorder, unspecified: Secondary | ICD-10-CM | POA: Diagnosis present

## 2021-12-22 DIAGNOSIS — J449 Chronic obstructive pulmonary disease, unspecified: Secondary | ICD-10-CM | POA: Diagnosis present

## 2021-12-22 DIAGNOSIS — I739 Peripheral vascular disease, unspecified: Secondary | ICD-10-CM | POA: Diagnosis present

## 2021-12-22 DIAGNOSIS — N312 Flaccid neuropathic bladder, not elsewhere classified: Secondary | ICD-10-CM | POA: Diagnosis present

## 2021-12-22 DIAGNOSIS — E785 Hyperlipidemia, unspecified: Secondary | ICD-10-CM | POA: Diagnosis present

## 2021-12-22 DIAGNOSIS — Z720 Tobacco use: Secondary | ICD-10-CM | POA: Diagnosis present

## 2021-12-22 DIAGNOSIS — Z91199 Patient's noncompliance with other medical treatment and regimen due to unspecified reason: Secondary | ICD-10-CM

## 2021-12-22 DIAGNOSIS — N183 Chronic kidney disease, stage 3 unspecified: Secondary | ICD-10-CM | POA: Diagnosis present

## 2021-12-22 DIAGNOSIS — Z7951 Long term (current) use of inhaled steroids: Secondary | ICD-10-CM

## 2021-12-22 DIAGNOSIS — Z841 Family history of disorders of kidney and ureter: Secondary | ICD-10-CM

## 2021-12-22 DIAGNOSIS — G629 Polyneuropathy, unspecified: Secondary | ICD-10-CM | POA: Diagnosis present

## 2021-12-22 DIAGNOSIS — Z79899 Other long term (current) drug therapy: Secondary | ICD-10-CM

## 2021-12-22 DIAGNOSIS — L03116 Cellulitis of left lower limb: Secondary | ICD-10-CM | POA: Diagnosis present

## 2021-12-22 DIAGNOSIS — L97429 Non-pressure chronic ulcer of left heel and midfoot with unspecified severity: Principal | ICD-10-CM

## 2021-12-22 DIAGNOSIS — G4700 Insomnia, unspecified: Secondary | ICD-10-CM | POA: Diagnosis present

## 2021-12-22 DIAGNOSIS — E66813 Obesity, class 3: Secondary | ICD-10-CM | POA: Diagnosis present

## 2021-12-22 DIAGNOSIS — G4733 Obstructive sleep apnea (adult) (pediatric): Secondary | ICD-10-CM | POA: Diagnosis present

## 2021-12-22 DIAGNOSIS — Z87891 Personal history of nicotine dependence: Secondary | ICD-10-CM

## 2021-12-22 DIAGNOSIS — N319 Neuromuscular dysfunction of bladder, unspecified: Secondary | ICD-10-CM | POA: Diagnosis present

## 2021-12-22 LAB — CBC WITH DIFFERENTIAL/PLATELET
Abs Immature Granulocytes: 0.02 10*3/uL (ref 0.00–0.07)
Basophils Absolute: 0.1 10*3/uL (ref 0.0–0.1)
Basophils Relative: 1 %
Eosinophils Absolute: 0.3 10*3/uL (ref 0.0–0.5)
Eosinophils Relative: 4 %
HCT: 31.2 % — ABNORMAL LOW (ref 36.0–46.0)
Hemoglobin: 9.7 g/dL — ABNORMAL LOW (ref 12.0–15.0)
Immature Granulocytes: 0 %
Lymphocytes Relative: 10 %
Lymphs Abs: 0.6 10*3/uL — ABNORMAL LOW (ref 0.7–4.0)
MCH: 27.2 pg (ref 26.0–34.0)
MCHC: 31.1 g/dL (ref 30.0–36.0)
MCV: 87.6 fL (ref 80.0–100.0)
Monocytes Absolute: 0.6 10*3/uL (ref 0.1–1.0)
Monocytes Relative: 9 %
Neutro Abs: 5.1 10*3/uL (ref 1.7–7.7)
Neutrophils Relative %: 76 %
Platelets: 165 10*3/uL (ref 150–400)
RBC: 3.56 MIL/uL — ABNORMAL LOW (ref 3.87–5.11)
RDW: 14.4 % (ref 11.5–15.5)
WBC: 6.7 10*3/uL (ref 4.0–10.5)
nRBC: 0 % (ref 0.0–0.2)

## 2021-12-22 LAB — COMPREHENSIVE METABOLIC PANEL
ALT: 12 U/L (ref 0–44)
AST: 15 U/L (ref 15–41)
Albumin: 3.4 g/dL — ABNORMAL LOW (ref 3.5–5.0)
Alkaline Phosphatase: 62 U/L (ref 38–126)
Anion gap: 7 (ref 5–15)
BUN: 27 mg/dL — ABNORMAL HIGH (ref 8–23)
CO2: 25 mmol/L (ref 22–32)
Calcium: 8.8 mg/dL — ABNORMAL LOW (ref 8.9–10.3)
Chloride: 107 mmol/L (ref 98–111)
Creatinine, Ser: 1.57 mg/dL — ABNORMAL HIGH (ref 0.44–1.00)
GFR, Estimated: 35 mL/min — ABNORMAL LOW (ref >60)
Glucose, Bld: 109 mg/dL — ABNORMAL HIGH (ref 70–99)
Potassium: 4.2 mmol/L (ref 3.5–5.1)
Sodium: 139 mmol/L (ref 135–145)
Total Bilirubin: 0.5 mg/dL (ref 0.3–1.2)
Total Protein: 6.1 g/dL — ABNORMAL LOW (ref 6.5–8.1)

## 2021-12-22 LAB — LACTIC ACID, PLASMA: Lactic Acid, Venous: 1 mmol/L (ref 0.5–1.9)

## 2021-12-22 MED ORDER — QUETIAPINE FUMARATE ER 50 MG PO TB24
150.0000 mg | ORAL_TABLET | Freq: Every day | ORAL | Status: DC
  Administered 2021-12-22 – 2021-12-25 (×4): 150 mg via ORAL
  Filled 2021-12-22 (×5): qty 3

## 2021-12-22 MED ORDER — AMLODIPINE BESYLATE 5 MG PO TABS
5.0000 mg | ORAL_TABLET | Freq: Every day | ORAL | Status: DC
  Administered 2021-12-24 – 2021-12-26 (×2): 5 mg via ORAL
  Filled 2021-12-22 (×3): qty 1

## 2021-12-22 MED ORDER — VANCOMYCIN HCL IN DEXTROSE 1-5 GM/200ML-% IV SOLN
1000.0000 mg | Freq: Once | INTRAVENOUS | Status: AC
  Administered 2021-12-22: 1000 mg via INTRAVENOUS

## 2021-12-22 MED ORDER — ONDANSETRON HCL 4 MG/2ML IJ SOLN
4.0000 mg | Freq: Four times a day (QID) | INTRAMUSCULAR | Status: DC | PRN
Start: 2021-12-22 — End: 2021-12-27

## 2021-12-22 MED ORDER — ATORVASTATIN CALCIUM 10 MG PO TABS
10.0000 mg | ORAL_TABLET | Freq: Every day | ORAL | Status: DC
  Administered 2021-12-22 – 2021-12-25 (×4): 10 mg via ORAL
  Filled 2021-12-22 (×4): qty 1

## 2021-12-22 MED ORDER — ACETAMINOPHEN 650 MG RE SUPP: 650.0000 mg | Freq: Four times a day (QID) | RECTAL | Status: DC | PRN

## 2021-12-22 MED ORDER — VANCOMYCIN HCL IN DEXTROSE 1-5 GM/200ML-% IV SOLN
1000.0000 mg | Freq: Once | INTRAVENOUS | Status: DC
Start: 2021-12-22 — End: 2021-12-23

## 2021-12-22 MED ORDER — PIPERACILLIN-TAZOBACTAM 3.375 G IVPB 30 MIN
3.3750 g | Freq: Once | INTRAVENOUS | Status: DC
  Filled 2021-12-22: qty 50

## 2021-12-22 MED ORDER — HYDRALAZINE HCL 50 MG PO TABS
50.0000 mg | ORAL_TABLET | Freq: Three times a day (TID) | ORAL | Status: DC
  Administered 2021-12-23 – 2021-12-26 (×8): 50 mg via ORAL
  Filled 2021-12-22 (×9): qty 1

## 2021-12-22 MED ORDER — DULOXETINE HCL 30 MG PO CPEP
60.0000 mg | ORAL_CAPSULE | ORAL | Status: DC
  Administered 2021-12-23 – 2021-12-26 (×4): 60 mg via ORAL
  Filled 2021-12-22 (×4): qty 2

## 2021-12-22 MED ORDER — DOCUSATE SODIUM 100 MG PO CAPS: 100.0000 mg | ORAL_CAPSULE | Freq: Two times a day (BID) | ORAL | Status: DC | PRN

## 2021-12-22 MED ORDER — HEPARIN SODIUM (PORCINE) 5000 UNIT/ML IJ SOLN
5000.0000 [IU] | Freq: Three times a day (TID) | INTRAMUSCULAR | Status: DC
  Administered 2021-12-22 – 2021-12-26 (×11): 5000 [IU] via SUBCUTANEOUS
  Filled 2021-12-22 (×10): qty 1

## 2021-12-22 MED ORDER — ALBUTEROL SULFATE (2.5 MG/3ML) 0.083% IN NEBU: 2.5000 mg | INHALATION_SOLUTION | Freq: Four times a day (QID) | RESPIRATORY_TRACT | Status: DC | PRN

## 2021-12-22 MED ORDER — NICOTINE 21 MG/24HR TD PT24: 21.0000 mg | MEDICATED_PATCH | Freq: Every day | TRANSDERMAL | Status: DC | PRN

## 2021-12-22 MED ORDER — SENNOSIDES-DOCUSATE SODIUM 8.6-50 MG PO TABS: 1.0000 | ORAL_TABLET | Freq: Every evening | ORAL | Status: DC | PRN

## 2021-12-22 MED ORDER — VORTIOXETINE HBR 5 MG PO TABS
5.0000 mg | ORAL_TABLET | Freq: Every day | ORAL | Status: DC
  Administered 2021-12-23 – 2021-12-26 (×4): 5 mg via ORAL
  Filled 2021-12-22 (×4): qty 1

## 2021-12-22 MED ORDER — VANCOMYCIN HCL IN DEXTROSE 1-5 GM/200ML-% IV SOLN
1000.0000 mg | Freq: Once | INTRAVENOUS | Status: DC
  Filled 2021-12-22: qty 200

## 2021-12-22 MED ORDER — BUPROPION HCL ER (XL) 150 MG PO TB24
300.0000 mg | ORAL_TABLET | Freq: Every day | ORAL | Status: DC
  Administered 2021-12-23 – 2021-12-26 (×4): 300 mg via ORAL
  Filled 2021-12-22 (×4): qty 2

## 2021-12-22 MED ORDER — SODIUM CHLORIDE 0.9 % IV SOLN
2.0000 g | Freq: Once | INTRAVENOUS | Status: AC
  Administered 2021-12-22: 2 g via INTRAVENOUS
  Filled 2021-12-22: qty 12.5

## 2021-12-22 MED ORDER — SODIUM CHLORIDE 0.9 % IV SOLN: INTRAVENOUS | Status: AC

## 2021-12-22 MED ORDER — MORPHINE SULFATE (PF) 2 MG/ML IV SOLN
2.0000 mg | INTRAVENOUS | Status: DC | PRN
  Administered 2021-12-25: 2 mg via INTRAVENOUS
  Filled 2021-12-22: qty 1

## 2021-12-22 MED ORDER — ONDANSETRON HCL 4 MG PO TABS: 4.0000 mg | ORAL_TABLET | Freq: Four times a day (QID) | ORAL | Status: DC | PRN

## 2021-12-22 MED ORDER — ADULT MULTIVITAMIN W/MINERALS CH
1.0000 | ORAL_TABLET | Freq: Every day | ORAL | Status: DC
  Administered 2021-12-23 – 2021-12-26 (×4): 1 via ORAL
  Filled 2021-12-22 (×4): qty 1

## 2021-12-22 MED ORDER — TRAZODONE HCL 50 MG PO TABS
50.0000 mg | ORAL_TABLET | Freq: Every day | ORAL | Status: DC
  Administered 2021-12-22 – 2021-12-25 (×4): 50 mg via ORAL
  Filled 2021-12-22 (×4): qty 1

## 2021-12-22 MED ORDER — LABETALOL HCL 5 MG/ML IV SOLN: 5.0000 mg | INTRAVENOUS | Status: DC | PRN

## 2021-12-22 MED ORDER — VANCOMYCIN HCL 1750 MG/350ML IV SOLN: 1750.0000 mg | INTRAVENOUS | Status: DC

## 2021-12-22 MED ORDER — GABAPENTIN 600 MG PO TABS
600.0000 mg | ORAL_TABLET | Freq: Three times a day (TID) | ORAL | Status: DC
  Administered 2021-12-22 – 2021-12-26 (×12): 600 mg via ORAL
  Filled 2021-12-22 (×12): qty 1

## 2021-12-22 MED ORDER — SODIUM CHLORIDE 0.9 % IV SOLN
2.0000 g | Freq: Two times a day (BID) | INTRAVENOUS | Status: DC
  Administered 2021-12-22 – 2021-12-25 (×6): 2 g via INTRAVENOUS
  Filled 2021-12-22 (×2): qty 12.5
  Filled 2021-12-22: qty 2
  Filled 2021-12-22 (×3): qty 12.5
  Filled 2021-12-22: qty 2

## 2021-12-22 MED ORDER — MOMETASONE FURO-FORMOTEROL FUM 200-5 MCG/ACT IN AERO
2.0000 | INHALATION_SPRAY | Freq: Two times a day (BID) | RESPIRATORY_TRACT | Status: DC
  Administered 2021-12-23 – 2021-12-26 (×7): 2 via RESPIRATORY_TRACT
  Filled 2021-12-22: qty 8.8

## 2021-12-22 MED ORDER — CLONAZEPAM 0.5 MG PO TABS
0.5000 mg | ORAL_TABLET | Freq: Two times a day (BID) | ORAL | Status: DC
  Administered 2021-12-22 – 2021-12-26 (×8): 0.5 mg via ORAL
  Filled 2021-12-22 (×8): qty 1

## 2021-12-22 MED ORDER — ACETAMINOPHEN 325 MG PO TABS
650.0000 mg | ORAL_TABLET | Freq: Four times a day (QID) | ORAL | Status: DC | PRN
  Administered 2021-12-25: 650 mg via ORAL
  Filled 2021-12-22: qty 2

## 2021-12-22 NOTE — ED Notes (Signed)
ED provider is at bedside evaluating pt. See documented skin assessment. Pt states has been unable to leave her house to see PCP.

## 2021-12-22 NOTE — Plan of Care (Signed)

## 2021-12-22 NOTE — Assessment & Plan Note (Signed)
-   Atorvastatin 10 mg nightly resumed 

## 2021-12-22 NOTE — Assessment & Plan Note (Addendum)
-   Resumed home amlodipine 5 mg daily, atorvastatin 10 mg nightly, hydralazine 50 mg p.o. 3 times daily - Labetalol 5 mg IV every 3 hours as needed for SBP greater than 180, 4 doses ordered

## 2021-12-22 NOTE — Assessment & Plan Note (Addendum)
-   Status post cefepime and vancomycin per ED, these antibiotics will be continued - ABI of the lower extremities were normal -Podiatry to take her to the OR tomorrow afternoon for debridement and bone biopsy. -Continue with antibiotics

## 2021-12-22 NOTE — Assessment & Plan Note (Addendum)
-   Resumed home bupropion 300 mg daily, duloxetine 60 mg every morning, trazodone 50 mg nightly, Trintellix 5 mg daily resumed - Quetiapine 150 mg nightly resume

## 2021-12-22 NOTE — ED Notes (Signed)
Dr Tobie Poet at bedside talking with pt.

## 2021-12-22 NOTE — Assessment & Plan Note (Signed)
-   Continue with vancomycin and cefepime

## 2021-12-22 NOTE — Assessment & Plan Note (Signed)
-   Chronic indwelling Foley catheter in place, per patient last replacement was 1 week ago - Patient has had chronic indwelling Foley catheter for approximately 8 to 10 years

## 2021-12-22 NOTE — Progress Notes (Signed)
Patient received from ED via stretcher.  Patient is alert and oriented x 4, lateral transfer from stretcher to bed with 4-person assist.  Noted left heel chronic ulcer, measurement 5.5 cm x 5.0 cm, with scant brown to serous-sanguinous drainage.  Chronic indwelling urinary catheter intact, with yellow, clear urine.  Oriented to room and unit routine.  Assisted in position of comfort in bed.  Needs addressed.

## 2021-12-22 NOTE — ED Notes (Signed)
Transport called to bring pt to floor.

## 2021-12-22 NOTE — Assessment & Plan Note (Deleted)
-   PRN nicotine patch ordered for nicotine craving

## 2021-12-22 NOTE — Assessment & Plan Note (Signed)
-   Not in acute exacerbation - Resumed home maintenance inhaler twice daily - Albuterol nebulizer as needed as needed for shortness of breath and wheezing

## 2021-12-22 NOTE — Hospital Course (Signed)
Ms. [name] is a 71 year old female with depression, anxiety, neuropathy, hypertension, insomnia, morbid obesity, who presents emergency department for chief concerns of nonhealing left heel wound.  Per patient the wound has been ongoing for the last 1 to 2 years.  Initial vitals in the emergency department showed temperature 98.4, respiration rate of 18, heart rate of 58, blood pressure 113/50, SPO2 of 94% on room air.  Serum sodium is 139, potassium 4.2, chloride 107, bicarb 25, BUN of 27, serum creatinine 1.57, nonfasting blood glucose 109, GFR of 35, WBC 6.7, hemoglobin 9.7, platelets of 165.  Lactic acid was 1.0.  ED treatment: Cefepime 2 g IV, vancomycin.  8/27; patient remained afebrile, no leukocytosis.  1/4 blood culture bottles with staph epi and Streptococcus species no further characterization.  Most likely a contaminant but patient do have an open wound.  Currently on cefepime and vancomycin. Left foot x-ray with no osseous abnormalities. Ordered left foot MRI to rule out osteomyelitis Podiatry to take her to the OR tomorrow for debridement and possible bone biopsy.  8/28: Patient remained stable.  Renal function seems improving, creatinine at 1.27 today.  Blood cultures remain negative. MRI left heel with some concern of early osteomyelitis although it was motion degraded picture.  Going to the OR for debridement and bone biopsy later today with podiatry.  8/29: Patient remained stable.  Underwent debridement and bone biopsy of left heel.  Preliminary cultures negative. PT/OT evaluation ordered. ID will see the patient today to determine the need and type of antibiotic.  Patient does not want to go to rehab if that will be the recommendations and would like to be back home with home health services. She will be nonweightbearing on left lower extremity per podiatry

## 2021-12-22 NOTE — Assessment & Plan Note (Signed)
Creatinine with some improvement to 1.33 with IV fluid.  Baseline appears to be between 1.3-1.5. Consistent with CKD stage IIIa -Seems stable -Monitor renal function -Avoid nephrotoxins

## 2021-12-22 NOTE — ED Triage Notes (Signed)
Pt in via EMS from home with c/o wound to her left heel that has been there for 2 years and is now black in color. Pt also febrile at 99.5. Pt is non ambulatory

## 2021-12-22 NOTE — ED Provider Notes (Addendum)
[facility] Provider Note    Event Date/Time   First MD Initiated Contact with Patient 12/22/21 1021     (approximate)   History   Wound Check   HPI  [name] is a 71 y.o. female with a history of MS, hypertension, chronic kidney disease, COPD, obesity, and depression who presents with a left leg wound and concern for possible infection.  The patient has had a wound on her left heel for approximately a year.  She states that over the last few weeks it has become increasingly painful and there has been malodorous discharge.  She states that over the last several days she has had increased redness spreading up her leg as well as pain going up the leg.  She had some chills but denies fever.  She states that she came in today because her home health provider stated they would no longer take care of her if she did not get it seen.   Physical Exam   Triage Vital Signs: ED Triage Vitals  Enc Vitals Group     BP 12/22/21 0830 (!) 113/50     Pulse Rate 12/22/21 0830 (!) 58     Resp 12/22/21 0830 18     Temp 12/22/21 0830 98.4 F (36.9 C)     Temp Source 12/22/21 0830 Oral     SpO2 12/22/21 0830 94 %     Weight 12/22/21 0832 275 lb 9.2 oz (125 kg)     Height 12/22/21 0832 '5\' 4"'$  (1.626 m)     Head Circumference --      Peak Flow --      Pain Score 12/22/21 0832 9     Pain Loc --      Pain Edu? --      Excl. in [place]? --     Most recent vital signs: Vitals:   12/22/21 1045 12/22/21 1542  BP: (!) 114/48 (!) 119/41  Pulse: (!) 54 (!) 50  Resp: 20 19  Temp:  98.2 F (36.8 C)  SpO2: 96% 98%     General: Alert and oriented, comfortable appearing. CV:  Good peripheral perfusion.  Resp:  Normal effort.  Abd:  No distention.  Other:  Left heel with approximately 3 cm relatively superficial appearing wound with dark eschar.  Small amount of malodorous whitish drainage.  1+ DP pulse and normal cap refill to the left foot.  Erythema, induration, and warmth  to the distal two thirds of the left lower leg.   ED Results / Procedures / Treatments   Labs (all labs ordered are listed, but only abnormal results are displayed) Labs Reviewed  COMPREHENSIVE METABOLIC PANEL - Abnormal; Notable for the following components:      Result Value   Glucose, Bld 109 (*)    BUN 27 (*)    Creatinine, Ser 1.57 (*)    Calcium 8.8 (*)    Total Protein 6.1 (*)    Albumin 3.4 (*)    GFR, Estimated 35 (*)    All other components within normal limits  CBC WITH DIFFERENTIAL/PLATELET - Abnormal; Notable for the following components:   RBC 3.56 (*)    Hemoglobin 9.7 (*)    HCT 31.2 (*)    Lymphs Abs 0.6 (*)    All other components within normal limits  CULTURE, BLOOD (ROUTINE X 2)  CULTURE, BLOOD (ROUTINE X 2)  LACTIC ACID, PLASMA     EKG    RADIOLOGY  XR L  foot: I independently viewed and interpreted the images; there is no evidence of fracture or bony abnormality of the heel  PROCEDURES:  Critical Care performed: No  Procedures   MEDICATIONS ORDERED IN ED: Medications  atorvastatin (LIPITOR) tablet 10 mg (has no administration in time range)  acetaminophen (TYLENOL) tablet 650 mg (has no administration in time range)    Or  acetaminophen (TYLENOL) suppository 650 mg (has no administration in time range)  ondansetron (ZOFRAN) tablet 4 mg (has no administration in time range)    Or  ondansetron (ZOFRAN) injection 4 mg (has no administration in time range)  heparin injection 5,000 Units (has no administration in time range)  senna-docusate (Senokot-S) tablet 1 tablet (has no administration in time range)  nicotine (NICODERM CQ - dosed in mg/24 hours) patch 21 mg (has no administration in time range)  vancomycin (VANCOCIN) IVPB 1000 mg/200 mL premix (has no administration in time range)  vancomycin (VANCOREADY) IVPB 1750 mg/350 mL (has no administration in time range)  ceFEPIme (MAXIPIME) 2 g in sodium chloride 0.9 % 100 mL IVPB (has no  administration in time range)  amLODipine (NORVASC) tablet 5 mg (has no administration in time range)  hydrALAZINE (APRESOLINE) tablet 50 mg (has no administration in time range)  buPROPion (WELLBUTRIN XL) 24 hr tablet 300 mg (has no administration in time range)  DULoxetine (CYMBALTA) DR capsule 60 mg (has no administration in time range)  QUEtiapine (SEROQUEL XR) 24 hr tablet 150 mg (has no administration in time range)  docusate sodium (COLACE) capsule 100 mg (has no administration in time range)  clonazePAM (KLONOPIN) tablet 0.5 mg (has no administration in time range)  gabapentin (NEURONTIN) tablet 600 mg (has no administration in time range)  multivitamin with minerals tablet 1 tablet (has no administration in time range)  mometasone-formoterol (DULERA) 200-5 MCG/ACT inhaler 2 puff (has no administration in time range)  vortioxetine HBr (TRINTELLIX) tablet 5 mg (has no administration in time range)  traZODone (DESYREL) tablet 50 mg (has no administration in time range)  0.9 %  sodium chloride infusion ( Intravenous New Bag/Given 12/22/21 1550)  albuterol (PROVENTIL) (2.5 MG/3ML) 0.083% nebulizer solution 2.5 mg (has no administration in time range)  labetalol (NORMODYNE) injection 5 mg (has no administration in time range)  morphine (PF) 2 MG/ML injection 2 mg (has no administration in time range)  vancomycin (VANCOCIN) IVPB 1000 mg/200 mL premix (0 mg Intravenous Stopped 12/22/21 1451)  ceFEPIme (MAXIPIME) 2 g in sodium chloride 0.9 % 100 mL IVPB (0 g Intravenous Stopped 12/22/21 1335)     IMPRESSION / MDM / ASSESSMENT AND PLAN / ED COURSE  I reviewed the triage vital signs and the nursing notes.  71 year old female with PMH as noted above presents with left foot wound with malodorous discharge as well as redness spreading up the left leg.  I reviewed the past medical records.  The patient was most recently admitted last March and per the hospitalist discharge summary from 07/05/2021 she  presented due to multiple falls and left hip pain.  There was concern for a possible MS flare but imaging was negative.  She was evaluated by PT/OT and sent to rehab.  Differential diagnosis includes, but is not limited to, cellulitis, wound infection, osteomyelitis.  I do not suspect DVT.  Although the patient has a slightly decreased DP pulse there is good color to the left lower extremity I do not suspect acute vascular occlusion.  Patient's presentation is most consistent with acute presentation with potential  threat to life or bodily function.  X-ray of the left foot shows no bony findings.  Lab work-up is overall reassuring.  There is no leukocytosis.  Creatinine is minimally elevated.  Lactate is normal.  I ordered IV vancomycin and cefepime for purulent cellulitis.  I consulted Dr. Tobie Poet from the hospitalist service; based on her discussion she agrees to admit the patient.   FINAL CLINICAL IMPRESSION(S) / ED DIAGNOSES   Final diagnoses:  Heel ulceration, left, with unspecified severity ([place])  Cellulitis of left lower extremity     Rx / DC Orders   ED Discharge Orders     None        Note:  This document was prepared using Dragon voice recognition software and may include unintentional dictation errors.    Arta Silence, MD 12/22/21 1443    Arta Silence, MD 12/22/21 1601

## 2021-12-22 NOTE — H&P (Signed)
History and Physical   [name] NAT:[id number] DOB: April 30, 1950 DOA: 12/22/2021  PCP: Kirk Ruths, MD  Outpatient Specialists: Dr. Jennings Books, neurology Patient coming from: Home  I have personally briefly reviewed patient's old medical records in [place].  Chief Concern: Left heel wound/black wound  HPI: [name] is a 71 year old female with depression, anxiety, neuropathy, hypertension, insomnia, morbid obesity, who presents emergency department for chief concerns of nonhealing left heel wound.  Per patient the wound has been ongoing for the last 1 to 2 years.  Initial vitals in the emergency department showed temperature 98.4, respiration rate of 18, heart rate of 58, blood pressure 113/50, SPO2 of 94% on room air.  Serum sodium is 139, potassium 4.2, chloride 107, bicarb 25, BUN of 27, serum creatinine 1.57, nonfasting blood glucose 109, GFR of 35, WBC 6.7, hemoglobin 9.7, platelets of 165.  Lactic acid was 1.0.  ED treatment: Cefepime 2 g IV, vancomycin.  At bedside patient was able to tell me her full name, her age, she knows she is in the hospital and she knows the current calendar year.  She reports that she has had this left heel wound for approximately 2 years.  She states that she came to the ED today because it turned black.  She denies any fever at home stating that her temperature was max of 99.5 at home.  She denies nausea, vomiting, chest pain, shortness of breath, chills. She denies dysuria and states she has a chronic indwelling Foley catheter.  She denies any diarrhea.  She endorses mild pain at the left lower extremity.  Social history: Lives at home.  Patient denies tobacco.  She states she infrequently drinks EtOH, last drink was 1 month ago.  She states that she smokes THC every night and denies other recreational drug use.  ROS: Constitutional: no weight change, no fever ENT/Mouth: no sore throat, no rhinorrhea Eyes: no eye pain,  no vision changes Cardiovascular: no chest pain, no dyspnea,  no edema, no palpitations Respiratory: no cough, no sputum, no wheezing Gastrointestinal: no nausea, no vomiting, no diarrhea, no constipation Genitourinary: no urinary incontinence, no dysuria, no hematuria Musculoskeletal: no arthralgias, no myalgias Skin: + skin lesions, no pruritus, Neuro: + weakness, no loss of consciousness, no syncope Psych: no anxiety, no depression, + decrease appetite Heme/Lymph: no bruising, no bleeding  ED Course: Discussed with emergency medicine provider, patient requiring hospitalization for chief concerns of nonhealing left heel wound and cellulitis.  Assessment/Plan  Principal Problem:   Non healing left heel wound Active Problems:   Major depressive disorder, recurrent, in partial remission ([place])   Essential hypertension   Atonic neurogenic bladder   Peripheral vascular disease of lower extremity with ulceration (HCC)   Obesity, Class III, BMI 40-49.9 (morbid obesity) ([place])   Follicular lymphoma of intra-abdominal lymph nodes (HCC)   Obstructive sleep apnea   Cellulitis of left lower leg   AKI (acute kidney injury) ([place])   Hyperlipidemia   COPD (chronic obstructive pulmonary disease) (HCC)   Assessment and Plan:  * Non healing left heel wound - Status post cefepime and vancomycin per ED, these antibiotics will be continued - ABI of the lower extremities have been ordered - Podiatry service, Dr. Amalia Hailey has been consulted for evaluation of wound for I&D versus amputation - Admit to MedSurg, observation, no telemetry  COPD (chronic obstructive pulmonary disease) ([place]) - Not in acute exacerbation - Resumed home maintenance inhaler twice daily - Albuterol  nebulizer as needed as needed for shortness of breath and wheezing  Hyperlipidemia - Atorvastatin 10 mg nightly resumed  AKI (acute kidney injury) ([place]) - Presumed prerenal secondary to poor p.o. intake - Sodium chloride 125  mL/h, 1 day ordered - BMP in the a.m.  Cellulitis of left lower leg - Continue with vancomycin and cefepime  Obesity, Class III, BMI 40-49.9 (morbid obesity) ([place]) - This complicates overall care and prognosis.   Atonic neurogenic bladder - Chronic indwelling Foley catheter in place, per patient last replacement was 1 week ago - Patient has had chronic indwelling Foley catheter for approximately 8 to 10 years  Essential hypertension - Resumed home amlodipine 5 mg daily, atorvastatin 10 mg nightly, hydralazine 50 mg p.o. 3 times daily - Labetalol 5 mg IV every 3 hours as needed for SBP greater than 180, 4 doses ordered  Major depressive disorder, recurrent, in partial remission (HCC) - Resumed home bupropion 300 mg daily, duloxetine 60 mg every morning, trazodone 50 mg nightly, Trintellix 5 mg daily resumed - Quetiapine 150 mg nightly resume  Chart reviewed.   DVT prophylaxis: Heparin 5000 units subcutaneous Code Status: Full code Diet: Heart healthy; n.p.o. after midnight Family Communication: No Disposition Plan: Pending clinical course Consults called: Podiatry Admission status: MedSurg, observation  Past Medical History:  Diagnosis Date   Abdominal aortic atherosclerosis ([place]) 11/11/2016   ADHD    Anxiety    COPD (chronic obstructive pulmonary disease) ([place])    Depression    major depressive   Dyspnea    doe   Edema    left leg   Follicular lymphoma ([place])    B Cell   Follicular lymphoma grade II ([place])    Hypertension    Hypotension    idiopathic   Kyphoscoliosis and scoliosis 11/26/2011   Morbid obesity ([place]) 01/05/2016   Multiple sclerosis (HCC)    Multiple sclerosis (HCC)    1980's   Neuromuscular disorder (HCC)    Obstructive and reflux uropathy    foley   Pain    atypical facial   Peripheral vascular disease of lower extremity with ulceration ([place]) 11/08/2015   Skin ulcer ([place]) 11/08/2015   Weakness    generalized. has MS   Past Surgical History:   Procedure Laterality Date   BACK SURGERY N/A 2002   CYST EXCISION     lower back   INGUINAL LYMPH NODE BIOPSY Left 07/04/2016   Procedure: INGUINAL LYMPH NODE BIOPSY;  Surgeon: Christene Lye, MD;  Location: [facility];  Service: General;  Laterality: Left;   ORIF FEMUR FRACTURE Left 02/04/2020   Procedure: OPEN REDUCTION INTERNAL FIXATION (ORIF) DISTAL FEMUR FRACTURE;  Surgeon: Shona Needles, MD;  Location: [facility];  Service: Orthopedics;  Laterality: Left;   PORTACATH PLACEMENT N/A 07/22/2016   Procedure: INSERTION PORT-A-CATH;  Surgeon: Christene Lye, MD;  Location: [facility];  Service: General;  Laterality: N/A;   TONSILLECTOMY AND ADENOIDECTOMY     TUBAL LIGATION     Social History:  reports that she quit smoking about 5 years ago. Her smoking use included cigarettes. She started smoking about 26 years ago. She has a 20.00 pack-year smoking history. She has never used smokeless tobacco. She reports current drug use. Drug: Marijuana. She reports that she does not drink alcohol.  No Known Allergies Family History  Problem Relation Age of Onset   COPD Mother    Diabetes Mother    Heart failure Mother    Alcohol abuse Father  Kidney disease Father    Kidney failure Father    Arthritis Sister    CAD Maternal Grandmother    Stroke Maternal Grandfather    Arthritis Sister    Mental illness Sister    Arthritis Brother    Family history: Family history reviewed and not pertinent.  Prior to Admission medications   Medication Sig Start Date End Date Taking? Authorizing Provider  acetaminophen (TYLENOL) 500 MG tablet Take 1,000 mg by mouth every 8 (eight) hours as needed for moderate pain.    [provider]  atorvastatin (LIPITOR) 10 MG tablet Take 10 mg by mouth at bedtime.  01/14/19   [provider]  budesonide-formoterol (SYMBICORT) 160-4.5 MCG/ACT inhaler Inhale 2 puffs into the lungs 2 (two) times daily.     [provider]  buPROPion  (WELLBUTRIN XL) 300 MG 24 hr tablet Take 300 mg by mouth daily. 03/21/21   [provider]  clonazePAM (KLONOPIN) 0.5 MG tablet Take 1 tablet (0.5 mg total) by mouth 2 (two) times daily. 02/10/20   Bonnielee Haff, MD  docusate sodium (COLACE) 100 MG capsule Take 1 capsule (100 mg total) by mouth 2 (two) times daily as needed. 11/24/19   Enzo Bi, MD  DULoxetine (CYMBALTA) 60 MG capsule Take 1 capsule (60 mg total) by mouth every morning. Patient taking differently: Take 60 mg by mouth daily. 10/14/16   Vaughan Basta, MD  gabapentin (NEURONTIN) 400 MG capsule Take 1 capsule (400 mg total) by mouth 3 (three) times daily. 07/05/21   Lorella Nimrod, MD  lisinopril (ZESTRIL) 20 MG tablet Take 0.5 tablets (10 mg total) by mouth daily. 03/29/21   Terrilee Croak, MD  magnesium oxide (MAG-OX) 400 MG tablet Take 400 mg by mouth daily.    [provider]  Multiple Vitamin (MULTIVITAMIN WITH MINERALS) TABS tablet Take 1 tablet by mouth daily.    [provider]  MYRBETRIQ 50 MG TB24 tablet TAKE ONE TABLET BY MOUTH ONCE DAILY 04/19/20   Stoioff, Ronda Fairly, MD  polyethylene glycol (MIRALAX / GLYCOLAX) 17 g packet Take 17 g by mouth daily as needed for mild constipation. 03/29/21   Terrilee Croak, MD  QUEtiapine Fumarate (SEROQUEL XR) 150 MG 24 hr tablet Take 150 mg by mouth at bedtime.     [provider]  saccharomyces boulardii (FLORASTOR) 250 MG capsule Take 1 capsule (250 mg total) by mouth 2 (two) times daily. 03/29/21   Terrilee Croak, MD  torsemide (DEMADEX) 20 MG tablet Take 1 tablet (20 mg total) by mouth daily as needed (edema). 07/05/21   Lorella Nimrod, MD  traZODone (DESYREL) 50 MG tablet Take 50 mg by mouth at bedtime. 03/21/21   [provider]  vortioxetine HBr (TRINTELLIX) 5 MG TABS tablet Take 5 mg by mouth daily.    [provider]   Physical Exam: Vitals:   12/22/21 0830 12/22/21 0832 12/22/21 1045  BP: (!) 113/50  (!) 114/48  Pulse: (!) 58   (!) 54  Resp: 18  20  Temp: 98.4 F (36.9 C)    TempSrc: Oral    SpO2: 94%  96%  Weight:  125 kg   Height:  '5\' 4"'$  (1.626 m)    Constitutional: appears age-appropriate, frail, NAD, calm, comfortable Eyes: PERRL, lids and conjunctivae normal ENMT: Mucous membranes are moist. Posterior pharynx clear of any exudate or lesions. Age-appropriate dentition. Hearing appropriate Neck: normal, supple, no masses, no thyromegaly Respiratory: clear to auscultation bilaterally, no wheezing, no crackles. Normal respiratory effort.  No accessory muscle use.  Cardiovascular: Regular rate and rhythm, no murmurs / rubs / gallops. No extremity edema. 2+ pedal pulses. No carotid bruits.  Abdomen: Obese abdomen, no tenderness, no masses palpated, no hepatosplenomegaly. Bowel sounds positive.  Musculoskeletal: no clubbing / cyanosis. No joint deformity upper and lower extremities. Good ROM, no contractures, no atrophy. Normal muscle tone.  Skin: Left heel necrotic appearing lesion, approximately 4 cm in diameter.  With surrounding and ascending redness and warmth consistent with cellulitis   Neurologic: Sensation intact. Strength 5/5 in all 4.  Psychiatric: Normal judgment and insight. Alert and oriented x 3.  Flat affect and depressed mood.  EKG: Not indicated at this time  X-ray on Admission: I personally reviewed and I agree with radiologist reading as below.  DG Foot Complete Left  Result Date: 12/22/2021 CLINICAL DATA:  Left heel wound for 2 years, now black. EXAM: LEFT FOOT - COMPLETE 3+ VIEW COMPARISON:  03/23/2021 ankle radiograph FINDINGS: Posterior heel ulcer. No evidence of osteomyelitis. No opaque foreign body. Generalized osteopenia.  No acute fracture or erosion. IMPRESSION: Heel ulcer without acute osseous finding or opaque foreign body. Electronically Signed   By: Jorje Guild M.D.   On: 12/22/2021 10:43    Labs on Admission: I have personally reviewed following labs  CBC: Recent Labs   Lab 12/22/21 0838  WBC 6.7  NEUTROABS 5.1  HGB 9.7*  HCT 31.2*  MCV 87.6  PLT 161   Basic Metabolic Panel: Recent Labs  Lab 12/22/21 0838  NA 139  K 4.2  CL 107  CO2 25  GLUCOSE 109*  BUN 27*  CREATININE 1.57*  CALCIUM 8.8*   GFR: Estimated Creatinine Clearance: 43.6 mL/min (A) (by C-G formula based on SCr of 1.57 mg/dL (H)).  Liver Function Tests: Recent Labs  Lab 12/22/21 0838  AST 15  ALT 12  ALKPHOS 62  BILITOT 0.5  PROT 6.1*  ALBUMIN 3.4*   Urine analysis:    Component Value Date/Time   COLORURINE YELLOW (A) 06/30/2021 0651   APPEARANCEUR CLEAR (A) 06/30/2021 0651   APPEARANCEUR Hazy 08/02/2013 0020   LABSPEC 1.006 06/30/2021 0651   LABSPEC 1.012 08/02/2013 0020   PHURINE 6.0 06/30/2021 0651   GLUCOSEU NEGATIVE 06/30/2021 0651   GLUCOSEU Negative 08/02/2013 0020   HGBUR SMALL (A) 06/30/2021 0651   BILIRUBINUR NEGATIVE 06/30/2021 0651   BILIRUBINUR Negative 08/02/2013 0020   KETONESUR NEGATIVE 06/30/2021 0651   PROTEINUR NEGATIVE 06/30/2021 0651   NITRITE NEGATIVE 06/30/2021 0651   LEUKOCYTESUR SMALL (A) 06/30/2021 0651   LEUKOCYTESUR 2+ 08/02/2013 0020   Dr. Tobie Poet [facility]  If 7PM-7AM, please contact overnight-coverage provider If 7AM-7PM, please contact day coverage provider [email or web address]  12/22/2021, 2:30 PM

## 2021-12-22 NOTE — Consult Note (Signed)
Pharmacy Antibiotic Note  [name] is a 71 y.o. female admitted on 12/22/2021 with  left heel wound . Pharmacy has been consulted for vancomycin and cefepime dosing.   Plan:  Cefepime 2 g IV q12h  Vancomycin 2 g IV LD followed by vancomycin 1.75 g IV q48h --Calculated AUC: 494, Cmin 10.3 --Daily Scr per protocol --Levels at steady state or as clinically indicated  Height: '5\' 4"'$  (162.6 cm) Weight: 125 kg (275 lb 9.2 oz) IBW/kg (Calculated) : 54.7  Temp (24hrs), Avg:98.4 F (36.9 C), Min:98.4 F (36.9 C), Max:98.4 F (36.9 C)  Recent Labs  Lab 12/22/21 0838  WBC 6.7  CREATININE 1.57*  LATICACIDVEN 1.0    Estimated Creatinine Clearance: 43.6 mL/min (A) (by C-G formula based on SCr of 1.57 mg/dL (H)).    No Known Allergies  Antimicrobials this admission: Cefepime 8/26 >>  Vancomycin 8/26 >>   Dose adjustments this admission: N/A  Microbiology results: 8/26 BCx: pending  Thank you for allowing pharmacy to be a part of this patient's care.  Benita Gutter 12/22/2021 2:08 PM

## 2021-12-22 NOTE — Assessment & Plan Note (Signed)
Estimated body mass index is 47.3 kg/m as calculated from the following:   Height as of this encounter: 5\' 4"  (1.626 m).   Weight as of this encounter: 125 kg.  - This complicates overall care and prognosis.

## 2021-12-22 NOTE — ED Notes (Signed)
Pt with wound to left heel, wound wrapped in kerlex, scant bloody drainage noted, foul odor noted.

## 2021-12-23 ENCOUNTER — Inpatient Hospital Stay: Payer: Medicare HMO

## 2021-12-23 DIAGNOSIS — N312 Flaccid neuropathic bladder, not elsewhere classified: Secondary | ICD-10-CM | POA: Diagnosis not present

## 2021-12-23 DIAGNOSIS — L89629 Pressure ulcer of left heel, unspecified stage: Secondary | ICD-10-CM | POA: Diagnosis present

## 2021-12-23 DIAGNOSIS — F3341 Major depressive disorder, recurrent, in partial remission: Secondary | ICD-10-CM | POA: Diagnosis present

## 2021-12-23 DIAGNOSIS — Z825 Family history of asthma and other chronic lower respiratory diseases: Secondary | ICD-10-CM | POA: Diagnosis not present

## 2021-12-23 DIAGNOSIS — Z87891 Personal history of nicotine dependence: Secondary | ICD-10-CM | POA: Diagnosis not present

## 2021-12-23 DIAGNOSIS — N1831 Chronic kidney disease, stage 3a: Secondary | ICD-10-CM | POA: Diagnosis present

## 2021-12-23 DIAGNOSIS — I129 Hypertensive chronic kidney disease with stage 1 through stage 4 chronic kidney disease, or unspecified chronic kidney disease: Secondary | ICD-10-CM | POA: Diagnosis present

## 2021-12-23 DIAGNOSIS — G4733 Obstructive sleep apnea (adult) (pediatric): Secondary | ICD-10-CM | POA: Diagnosis present

## 2021-12-23 DIAGNOSIS — L97429 Non-pressure chronic ulcer of left heel and midfoot with unspecified severity: Secondary | ICD-10-CM | POA: Diagnosis not present

## 2021-12-23 DIAGNOSIS — G35 Multiple sclerosis: Secondary | ICD-10-CM | POA: Diagnosis present

## 2021-12-23 DIAGNOSIS — F909 Attention-deficit hyperactivity disorder, unspecified type: Secondary | ICD-10-CM | POA: Diagnosis present

## 2021-12-23 DIAGNOSIS — Z8249 Family history of ischemic heart disease and other diseases of the circulatory system: Secondary | ICD-10-CM | POA: Diagnosis not present

## 2021-12-23 DIAGNOSIS — N179 Acute kidney failure, unspecified: Secondary | ICD-10-CM | POA: Diagnosis present

## 2021-12-23 DIAGNOSIS — E785 Hyperlipidemia, unspecified: Secondary | ICD-10-CM | POA: Diagnosis present

## 2021-12-23 DIAGNOSIS — Z6841 Body Mass Index (BMI) 40.0 and over, adult: Secondary | ICD-10-CM | POA: Diagnosis not present

## 2021-12-23 DIAGNOSIS — G629 Polyneuropathy, unspecified: Secondary | ICD-10-CM | POA: Diagnosis present

## 2021-12-23 DIAGNOSIS — F419 Anxiety disorder, unspecified: Secondary | ICD-10-CM | POA: Diagnosis present

## 2021-12-23 DIAGNOSIS — J449 Chronic obstructive pulmonary disease, unspecified: Secondary | ICD-10-CM | POA: Diagnosis present

## 2021-12-23 DIAGNOSIS — N319 Neuromuscular dysfunction of bladder, unspecified: Secondary | ICD-10-CM | POA: Diagnosis present

## 2021-12-23 DIAGNOSIS — I70244 Atherosclerosis of native arteries of left leg with ulceration of heel and midfoot: Secondary | ICD-10-CM | POA: Diagnosis present

## 2021-12-23 DIAGNOSIS — I700 Atherosclerosis of aorta: Secondary | ICD-10-CM | POA: Diagnosis present

## 2021-12-23 DIAGNOSIS — L03116 Cellulitis of left lower limb: Secondary | ICD-10-CM | POA: Diagnosis present

## 2021-12-23 DIAGNOSIS — G4700 Insomnia, unspecified: Secondary | ICD-10-CM | POA: Diagnosis present

## 2021-12-23 DIAGNOSIS — Z841 Family history of disorders of kidney and ureter: Secondary | ICD-10-CM | POA: Diagnosis not present

## 2021-12-23 DIAGNOSIS — C8293 Follicular lymphoma, unspecified, intra-abdominal lymph nodes: Secondary | ICD-10-CM | POA: Diagnosis present

## 2021-12-23 DIAGNOSIS — S91302A Unspecified open wound, left foot, initial encounter: Secondary | ICD-10-CM | POA: Diagnosis not present

## 2021-12-23 LAB — BASIC METABOLIC PANEL
Anion gap: 7 (ref 5–15)
BUN: 23 mg/dL (ref 8–23)
CO2: 23 mmol/L (ref 22–32)
Calcium: 8.5 mg/dL — ABNORMAL LOW (ref 8.9–10.3)
Chloride: 108 mmol/L (ref 98–111)
Creatinine, Ser: 1.33 mg/dL — ABNORMAL HIGH (ref 0.44–1.00)
GFR, Estimated: 43 mL/min — ABNORMAL LOW (ref >60)
Glucose, Bld: 94 mg/dL (ref 70–99)
Potassium: 4.2 mmol/L (ref 3.5–5.1)
Sodium: 138 mmol/L (ref 135–145)

## 2021-12-23 LAB — BLOOD CULTURE ID PANEL (REFLEXED) - BCID2

## 2021-12-23 LAB — CBC
HCT: 29.7 % — ABNORMAL LOW (ref 36.0–46.0)
Hemoglobin: 9.3 g/dL — ABNORMAL LOW (ref 12.0–15.0)
MCH: 27.4 pg (ref 26.0–34.0)
MCHC: 31.3 g/dL (ref 30.0–36.0)
MCV: 87.6 fL (ref 80.0–100.0)
Platelets: 159 10*3/uL (ref 150–400)
RBC: 3.39 MIL/uL — ABNORMAL LOW (ref 3.87–5.11)
RDW: 14.4 % (ref 11.5–15.5)
WBC: 6.5 10*3/uL (ref 4.0–10.5)
nRBC: 0 % (ref 0.0–0.2)

## 2021-12-23 MED ORDER — VANCOMYCIN HCL 1500 MG/300ML IV SOLN
1500.0000 mg | Freq: Once | INTRAVENOUS | Status: AC
  Administered 2021-12-23: 1500 mg via INTRAVENOUS
  Filled 2021-12-23: qty 300

## 2021-12-23 MED ORDER — CHLORHEXIDINE GLUCONATE CLOTH 2 % EX PADS
6.0000 | MEDICATED_PAD | Freq: Every day | CUTANEOUS | Status: DC
  Administered 2021-12-23 – 2021-12-26 (×4): 6 via TOPICAL

## 2021-12-23 MED ORDER — VANCOMYCIN HCL IN DEXTROSE 1-5 GM/200ML-% IV SOLN
1000.0000 mg | INTRAVENOUS | Status: DC
  Administered 2021-12-24: 1000 mg via INTRAVENOUS
  Filled 2021-12-23 (×2): qty 200

## 2021-12-23 NOTE — Progress Notes (Signed)
Progress Note   Patient: [name] QHU:[id number] DOB: [birth year] DOA: 12/22/2021     0 DOS: the patient was seen and examined on 12/23/2021   Brief hospital course: Ms. [name] is a 71 year old female with depression, anxiety, neuropathy, hypertension, insomnia, morbid obesity, who presents emergency department for chief concerns of nonhealing left heel wound.  Per patient the wound has been ongoing for the last 1 to 2 years.  Initial vitals in the emergency department showed temperature 98.4, respiration rate of 18, heart rate of 58, blood pressure 113/50, SPO2 of 94% on room air.  Serum sodium is 139, potassium 4.2, chloride 107, bicarb 25, BUN of 27, serum creatinine 1.57, nonfasting blood glucose 109, GFR of 35, WBC 6.7, hemoglobin 9.7, platelets of 165.  Lactic acid was 1.0.  ED treatment: Cefepime 2 g IV, vancomycin.  8/27; patient remained afebrile, no leukocytosis.  1/4 blood culture bottles with staph epi and Streptococcus species no further characterization.  Most likely a contaminant but patient do have an open wound.  Currently on cefepime and vancomycin. Left foot x-ray with no osseous abnormalities. Ordered left foot MRI to rule out osteomyelitis Podiatry to take her to the OR tomorrow for debridement and possible bone biopsy.   Assessment and Plan: * Non healing left heel wound - Status post cefepime and vancomycin per ED, these antibiotics will be continued - ABI of the lower extremities were normal -Podiatry to take her to the OR tomorrow afternoon for debridement and bone biopsy. -Continue with antibiotics  Cellulitis of left lower leg - Continue with vancomycin and cefepime  Essential hypertension - Resumed home amlodipine 5 mg daily, atorvastatin 10 mg nightly, hydralazine 50 mg p.o. 3 times daily - Labetalol 5 mg IV every 3 hours as needed for SBP greater than 180, 4 doses ordered  Atonic neurogenic bladder - Chronic indwelling Foley catheter in  place, per patient last replacement was 1 week ago - Patient has had chronic indwelling Foley catheter for approximately 8 to 10 years  Chronic kidney disease (CKD), stage III (moderate) (HCC) Creatinine with some improvement to 1.33 with IV fluid.  Baseline appears to be between 1.3-1.5. Consistent with CKD stage IIIa -Seems stable -Monitor renal function -Avoid nephrotoxins  Major depressive disorder, recurrent, in partial remission (HCC) - Resumed home bupropion 300 mg daily, duloxetine 60 mg every morning, trazodone 50 mg nightly, Trintellix 5 mg daily resumed - Quetiapine 150 mg nightly resume  Obesity, Class III, BMI 40-49.9 (morbid obesity) ([place]) Estimated body mass index is 47.3 kg/m as calculated from the following:   Height as of this encounter: '5\' 4"'$  (1.626 m).   Weight as of this encounter: 125 kg.  - This complicates overall care and prognosis.   COPD (chronic obstructive pulmonary disease) (HCC) - Not in acute exacerbation - Resumed home maintenance inhaler twice daily - Albuterol nebulizer as needed as needed for shortness of breath and wheezing  Hyperlipidemia - Atorvastatin 10 mg nightly resumed     Subjective: Patient continued to have some left heel pain.  Physical Exam: Vitals:   12/22/21 1542 12/22/21 1919 12/23/21 0456 12/23/21 0848  BP: (!) 119/41 (!) 122/51 (!) 109/47 (!) 102/41  Pulse: (!) 50 (!) 52 (!) 52 (!) 52  Resp: '19 20 16 16  '$ Temp: 98.2 F (36.8 C) 98 F (36.7 C) 98.8 F (37.1 C) 99.2 F (37.3 C)  TempSrc: Oral Oral Oral Oral  SpO2: 98% 97% 95% 94%  Weight:  Height:       General.  Morbidly obese lady, in no acute distress. Pulmonary.  Lungs clear bilaterally, normal respiratory effort. CV.  Regular rate and rhythm, no JVD, rub or murmur. Abdomen.  Soft, nontender, nondistended, BS positive. CNS.  Alert and oriented .  No focal neurologic deficit. Extremities.  Left heel with large ulcers with some surrounding eschar, 1+  surrounding edema, no significant erythema. Psychiatry.  Judgment and insight appears normal.  Data Reviewed: Prior data reviewed  Family Communication: Called husband with no response  Disposition: Status is: Observation The patient will require care spanning > 2 midnights and should be moved to inpatient because: Patient need her surgery tomorrow.  Planned Discharge Destination: Home with Home Health  DVT prophylaxis.  Subcu heparin Time spent: 45 minutes  This record has been created using Systems analyst. Errors have been sought and corrected,but may not always be located. Such creation errors do not reflect on the standard of care.  Author: Lorella Nimrod, MD 12/23/2021 2:07 PM  For on call review [email or web address].

## 2021-12-23 NOTE — Consult Note (Signed)
PODIATRY CONSULTATION  NAME [name] MRN [id number] DOB 1950-11-05 DOA 12/22/2021   Reason for consult: Decubitus heel ulcer left Chief Complaint  Patient presents with   Wound Check    Consulting physician: Amy Cox DO, [facility]  History of present illness: 71 y.o. female PMHx morbid obesity, HTN, neuropathy, not diabetic admitted for worsening infection left heel ulcer that has been present for the past 1-2 years.  Patient states that recently she noticed it getting black and presented to the ED and was ultimately admitted for worsening infection left heel ulcer.  Past Medical History:  Diagnosis Date   Abdominal aortic atherosclerosis ([place]) 11/11/2016   ADHD    Anxiety    COPD (chronic obstructive pulmonary disease) (HCC)    Depression    major depressive   Dyspnea    doe   Edema    left leg   Follicular lymphoma (HCC)    B Cell   Follicular lymphoma grade II (HCC)    Hypertension    Hypotension    idiopathic   Kyphoscoliosis and scoliosis 11/26/2011   Morbid obesity ([place]) 01/05/2016   Multiple sclerosis (HCC)    Multiple sclerosis ([place])    1980's   Neuromuscular disorder ([place])    Obstructive and reflux uropathy    foley   Pain    atypical facial   Peripheral vascular disease of lower extremity with ulceration ([place]) 11/08/2015   Skin ulcer ([place]) 11/08/2015   Weakness    generalized. has MS       Latest Ref Rng & Units 12/23/2021    6:17 AM 12/22/2021    8:38 AM 07/02/2021    6:38 AM  CBC  WBC 4.0 - 10.5 K/uL 6.5  6.7  6.3   Hemoglobin 12.0 - 15.0 g/dL 9.3  9.7  10.1   Hematocrit 36.0 - 46.0 % 29.7  31.2  31.9   Platelets 150 - 400 K/uL 159  165  196        Latest Ref Rng & Units 12/23/2021    6:17 AM 12/22/2021    8:38 AM 07/03/2021    9:05 AM  BMP  Glucose 70 - 99 mg/dL 94  109  101   BUN 8 - 23 mg/dL '23  27  20   '$ Creatinine 0.44 - 1.00 mg/dL 1.33  1.57  1.38   Sodium 135 - 145 mmol/L 138  139  138   Potassium 3.5 - 5.1 mmol/L 4.2  4.2  3.8    Chloride 98 - 111 mmol/L 108  107  100   CO2 22 - 32 mmol/L '23  25  29   '$ Calcium 8.9 - 10.3 mg/dL 8.5  8.8  9.3          Physical Exam: General: The patient is alert and oriented x3 in no acute distress.   Dermatology: Decubitus heel ulcer noted left with serous drainage and periwound maceration with moderate malodor.  The wound does not appear to probe to bone.  Vascular: Skin warm to touch bilateral lower extremities.  No significant erythema compared to the contralateral limb. US ARTERIAL ABI 12/22/2021 IMPRESSION: Normal resting ABIs.  Neurological: Light touch and protective threshold diminished bilaterally.   Musculoskeletal Exam: No structural deformity noted.  No prior amputations.  Patient is ambulatory    ASSESSMENT/PLAN OF CARE Decubitus heel ulcer LT -MRI pending -Patient will need to go to the OR for excisional debridement of the heel ulcer with possible bone biopsy pending MRI  results.  Discussed with the OR today and unfortunately they do not have the capacity/staffing to proceed with surgery today since the patient is morbidly obese and this would be in the prone position. -Diet carb modified for today -We will plan for taking the patient to the OR for debridement, possible bone biopsy, possible application of wound graft tomorrow, 12/24/2021, after 5 PM -N.p.o. after full breakfast -Continue ABX as per current orders.  Maxipime 2g Q12H.  Vancomycin 1g Q24H -Podiatry will continue to follow      Thank you for the consult.  Please contact me directly with any questions or concerns.  Cell [phone]   Edrick Kins, DPM [facility]  Dr. Edrick Kins, DPM    [address], [place] [address]                Office [phone]  Fax [phone]

## 2021-12-23 NOTE — Care Management Obs Status (Signed)
[facility] NOTIFICATION   Patient Details  Name: KIMIYAH BLICK MRN: [id number] Date of Birth: Nov 23, 1950   Medicare Observation Status Notification Given:  Yes    Moraima Burd E Keona Sheffler, LCSW 12/23/2021, 10:48 AM

## 2021-12-23 NOTE — Progress Notes (Signed)
PHARMACY - PHYSICIAN COMMUNICATION CRITICAL VALUE ALERT - BLOOD CULTURE IDENTIFICATION (BCID)  [name] is an 71 y.o. female who presented to [facility] on 12/22/2021 with a chief complaint of left heel wound  Assessment:  1/4 bottles GPC. BCID detected MRSE and Streptococcus species. Un-clear if contamination versus true bacteremia. Source would be left heel ulcer  Name of physician (or Provider) Contacted: Dr. Reesa Chew  Current antibiotics: Vancomycin + cefepime  Changes to prescribed antibiotics recommended:  Continue with current therapy as above. Repeat blood cultures  Results for orders placed or performed during the hospital encounter of 12/22/21  Blood Culture ID Panel (Reflexed) (Collected: 12/22/2021 12:47 PM)  Result Value Ref Range   Enterococcus faecalis NOT DETECTED NOT DETECTED   Enterococcus Faecium NOT DETECTED NOT DETECTED   Listeria monocytogenes NOT DETECTED NOT DETECTED   Staphylococcus species DETECTED (A) NOT DETECTED   Staphylococcus aureus (BCID) NOT DETECTED NOT DETECTED   Staphylococcus epidermidis DETECTED (A) NOT DETECTED   Staphylococcus lugdunensis NOT DETECTED NOT DETECTED   Streptococcus species DETECTED (A) NOT DETECTED   Streptococcus agalactiae NOT DETECTED NOT DETECTED   Streptococcus pneumoniae NOT DETECTED NOT DETECTED   Streptococcus pyogenes NOT DETECTED NOT DETECTED   A.calcoaceticus-baumannii NOT DETECTED NOT DETECTED   Bacteroides fragilis NOT DETECTED NOT DETECTED   Enterobacterales NOT DETECTED NOT DETECTED   Enterobacter cloacae complex NOT DETECTED NOT DETECTED   Escherichia coli NOT DETECTED NOT DETECTED   Klebsiella aerogenes NOT DETECTED NOT DETECTED   Klebsiella oxytoca NOT DETECTED NOT DETECTED   Klebsiella pneumoniae NOT DETECTED NOT DETECTED   Proteus species NOT DETECTED NOT DETECTED   Salmonella species NOT DETECTED NOT DETECTED   Serratia marcescens NOT DETECTED NOT DETECTED   Haemophilus influenzae NOT DETECTED NOT  DETECTED   Neisseria meningitidis NOT DETECTED NOT DETECTED   Pseudomonas aeruginosa NOT DETECTED NOT DETECTED   Stenotrophomonas maltophilia NOT DETECTED NOT DETECTED   Candida albicans NOT DETECTED NOT DETECTED   Candida auris NOT DETECTED NOT DETECTED   Candida glabrata NOT DETECTED NOT DETECTED   Candida krusei NOT DETECTED NOT DETECTED   Candida parapsilosis NOT DETECTED NOT DETECTED   Candida tropicalis NOT DETECTED NOT DETECTED   Cryptococcus neoformans/gattii NOT DETECTED NOT DETECTED   Methicillin resistance mecA/C DETECTED (A) NOT DETECTED    Benita Gutter 12/23/2021  8:11 AM

## 2021-12-23 NOTE — H&P (View-Only) (Signed)
PODIATRY CONSULTATION  NAME [name] MRN [id number] DOB [birth year] DOA 12/22/2021   Reason for consult: Decubitus heel ulcer left Chief Complaint  Patient presents with   Wound Check    Consulting physician: Amy Cox DO, [facility]  History of present illness: 71 y.o. female PMHx morbid obesity, HTN, neuropathy, not diabetic admitted for worsening infection left heel ulcer that has been present for the past 1-2 years.  Patient states that recently she noticed it getting black and presented to the ED and was ultimately admitted for worsening infection left heel ulcer.  Past Medical History:  Diagnosis Date   Abdominal aortic atherosclerosis ([place]) 11/11/2016   ADHD    Anxiety    COPD (chronic obstructive pulmonary disease) (HCC)    Depression    major depressive   Dyspnea    doe   Edema    left leg   Follicular lymphoma (HCC)    B Cell   Follicular lymphoma grade II (HCC)    Hypertension    Hypotension    idiopathic   Kyphoscoliosis and scoliosis 11/26/2011   Morbid obesity ([place]) 01/05/2016   Multiple sclerosis (HCC)    Multiple sclerosis ([place])    1980's   Neuromuscular disorder ([place])    Obstructive and reflux uropathy    foley   Pain    atypical facial   Peripheral vascular disease of lower extremity with ulceration ([place]) 11/08/2015   Skin ulcer ([place]) 11/08/2015   Weakness    generalized. has MS       Latest Ref Rng & Units 12/23/2021    6:17 AM 12/22/2021    8:38 AM 07/02/2021    6:38 AM  CBC  WBC 4.0 - 10.5 K/uL 6.5  6.7  6.3   Hemoglobin 12.0 - 15.0 g/dL 9.3  9.7  10.1   Hematocrit 36.0 - 46.0 % 29.7  31.2  31.9   Platelets 150 - 400 K/uL 159  165  196        Latest Ref Rng & Units 12/23/2021    6:17 AM 12/22/2021    8:38 AM 07/03/2021    9:05 AM  BMP  Glucose 70 - 99 mg/dL 94  109  101   BUN 8 - 23 mg/dL '23  27  20   '$ Creatinine 0.44 - 1.00 mg/dL 1.33  1.57  1.38   Sodium 135 - 145 mmol/L 138  139  138   Potassium 3.5 - 5.1 mmol/L 4.2  4.2  3.8    Chloride 98 - 111 mmol/L 108  107  100   CO2 22 - 32 mmol/L '23  25  29   '$ Calcium 8.9 - 10.3 mg/dL 8.5  8.8  9.3          Physical Exam: General: The patient is alert and oriented x3 in no acute distress.   Dermatology: Decubitus heel ulcer noted left with serous drainage and periwound maceration with moderate malodor.  The wound does not appear to probe to bone.  Vascular: Skin warm to touch bilateral lower extremities.  No significant erythema compared to the contralateral limb. US ARTERIAL ABI 12/22/2021 IMPRESSION: Normal resting ABIs.  Neurological: Light touch and protective threshold diminished bilaterally.   Musculoskeletal Exam: No structural deformity noted.  No prior amputations.  Patient is ambulatory    ASSESSMENT/PLAN OF CARE Decubitus heel ulcer LT -MRI pending -Patient will need to go to the OR for excisional debridement of the heel ulcer with possible bone biopsy pending MRI  results.  Discussed with the OR today and unfortunately they do not have the capacity/staffing to proceed with surgery today since the patient is morbidly obese and this would be in the prone position. -Diet carb modified for today -We will plan for taking the patient to the OR for debridement, possible bone biopsy, possible application of wound graft tomorrow, 12/24/2021, after 5 PM -N.p.o. after full breakfast -Continue ABX as per current orders.  Maxipime 2g Q12H.  Vancomycin 1g Q24H -Podiatry will continue to follow      Thank you for the consult.  Please contact me directly with any questions or concerns.  Cell [phone]   Edrick Kins, DPM [facility]  Dr. Edrick Kins, DPM    [address], [place] [address]                Office [phone]  Fax [phone]

## 2021-12-23 NOTE — TOC Initial Note (Signed)
Transition of Care [facility]) - Initial/Assessment Note    Patient Details  Name: [name] MRN: [id number] Date of Birth: Jan 09, 1951  [facility] of [facility]) CM/SW Contact:    Magnus Ivan, LCSW Phone Number: 12/23/2021, 10:36 AM  Clinical Narrative:                 TOC consulted for "Patient is unable to go to doctor's appointments, husband unable to help patient."  CSW spoke with patient. Patient lives with her husband who she stated is able to drive her to appointments. CSW inquired about issues with getting to appointments, patient states the issue is her house is cluttered. Patient states her husband is supposed to be working on decluttering the house now. CSW asked if patient needs contact info for DSS to see if they can provide resources for decluttering, patient denies resources at this time.  CSW asked for specific needs regarding to husband unable to help patient. Patient states her husband feels overwhelmed by doing everything in the home. Patient states it would help if she could get a new wheelchair as hers doesn't work well. CSW reached out to [employer], spoke to Mardene Celeste who stated patient got her wheelchair in Feb 2021 so would not be eligible for a new one through insurance until 2026. Per Mardene Celeste, to private pay for a wheelchair would be about $150 a month. Per Mardene Celeste, patient owns the wheelchair now so [employer] cannot go out to try to fix it. Updated patient. Patient says she cannot afford $150 a month. Discussed options of ordering online or going to hospice thrift store. Patient verbalized understanding.  Patient states her PCP is Dr. Ouida Sills. Patient gets her medications [facility] from [facility] and some from [facility] in [place]. Patient states she is active with [facility] for [facility], CSW reached out to [place] with [facility] to confirm- awaiting response. Patient states she has a shower stool, raised toilet seat, rolling walker, and wheelchair at home. Patient went  to [facility] in the past.    Expected Discharge Plan: [place] Barriers to Discharge: Continued Medical Work up   Patient Goals and CMS Choice Patient states their goals for this hospitalization and ongoing recovery are:: home with [facility] CMS Medicare.gov Compare Post Acute Care list provided to:: Patient Choice offered to / list presented to : Patient  Expected Discharge Plan and Services Expected Discharge Plan: [place]       Living arrangements for the past 2 months: Single Family Home                                      Prior Living Arrangements/Services Living arrangements for the past 2 months: Single Family Home Lives with:: Spouse Patient language and need for interpreter reviewed:: Yes Do you feel safe going back to the place where you live?: Yes      Need for Family Participation in Patient Care: Yes (Comment) Care giver support system in place?: Yes (comment) Current home services: DME Criminal Activity/Legal Involvement Pertinent to Current Situation/Hospitalization: No - Comment as needed  Activities of Daily Living Home Assistive Devices/Equipment: Bedside commode/3-in-1, Cane (specify quad or straight), Walker (specify type) ADL Screening (condition at time of admission) Patient's cognitive ability adequate to safely complete daily activities?: Yes Is the patient deaf or have difficulty hearing?: No Does the patient have difficulty seeing,  even when wearing glasses/contacts?: No Does the patient have difficulty concentrating, remembering, or making decisions?: No Patient able to express need for assistance with ADLs?: Yes Does the patient have difficulty dressing or bathing?: Yes Independently performs ADLs?: No Communication: Needs assistance Is this a change from baseline?: Pre-admission baseline Dressing (OT): Needs assistance Is this a change from baseline?: Pre-admission baseline Grooming: Needs  assistance Is this a change from baseline?: Pre-admission baseline Feeding: Independent Bathing: Dependent Is this a change from baseline?: Pre-admission baseline Toileting: Dependent Is this a change from baseline?: Pre-admission baseline In/Out Bed: Needs assistance Is this a change from baseline?: Pre-admission baseline Walks in Home: Needs assistance Is this a change from baseline?: Pre-admission baseline Does the patient have difficulty walking or climbing stairs?: Yes Weakness of Legs: Both Weakness of Arms/Hands: None  Permission Sought/Granted Permission sought to share information with : Facility Art therapist granted to share information with : Yes, Verbal Permission Granted     Permission granted to share info w AGENCY: [facility], DME        Emotional Assessment       Orientation: : Oriented to Self, Oriented to Place, Oriented to  Time, Oriented to Situation Alcohol / Substance Use: Not Applicable Psych Involvement: No (comment)  Admission diagnosis:  Non healing left heel wound [S91.302A] Patient Active Problem List   Diagnosis Date Noted   Non healing left heel wound 12/22/2021   Closed fracture of distal end of left femur ([place]) 02/04/2020   Femur fracture, left ([place]) 02/04/2020   Weakness of left leg 01/26/2020   Left leg weakness 01/26/2020   COPD (chronic obstructive pulmonary disease) ([place]) 01/01/2020   Foley catheter in place on admission 11/19/2019   Neuropathy    Hypokalemia    Hyperlipidemia    Sepsis secondary to UTI ([place]) 10/17/2019   Weakness    AKI (acute kidney injury) ([place]) 04/20/2019   Pressure injury of skin 02/03/2019   Bilateral lower leg cellulitis 02/02/2019   Ovarian mass, left 01/27/2019   Sepsis ([place]) 01/05/2019   UTI (urinary tract infection) 06/13/2018   Altered mental status 06/11/2018   Fall 05/13/2018   Depression 05/13/2018   Recurrent cellulitis of lower extremity 06/25/2017   Medication monitoring  encounter 06/05/2017   Cellulitis of left lower leg 04/25/2017   Adjustment disorder with mixed disturbance of emotions and conduct 04/23/2017   Foot pain, bilateral 02/03/2017   Tinea pedis 02/03/2017   Left ovarian cyst 12/09/2016   Abdominal aortic atherosclerosis ([place]) 11/11/2016   Cellulitis of left lower extremity 10/11/2016   Swelling of lower extremity 10/11/2016   Obstructive sleep apnea 19/41/7408   Follicular lymphoma of intra-abdominal lymph nodes ([place]) 08/06/2016   Multiple falls 06/07/2016   Inguinal adenopathy 05/29/2016   Obesity, Class III, BMI 40-49.9 (morbid obesity) ([place]) 01/05/2016   Low HDL (under 40) 12/19/2015   Cellulitis 12/18/2015   Skin ulcer ([place]) 11/08/2015   Peripheral vascular disease of lower extremity with ulceration ([place]) 11/08/2015   CKD (chronic kidney disease) stage 3, GFR 30-59 ml/min (HCC) 11/08/2015   Constipation due to pain medication 01/31/2015   Obstructive sleep apnea of adult 01/13/2015   Pelvic muscle wasting 01/13/2015   Incomplete bladder emptying 01/13/2015   Headache, migraine 10/24/2014   Atonic neurogenic bladder 10/24/2014   Lumbar radiculopathy, chronic 10/02/2013   COPD with bronchial hyperresponsiveness ([place]) 10/02/2013   Major depressive disorder, recurrent, in partial remission ([place]) 10/02/2013   Essential hypertension 10/02/2013   Multiple sclerosis ([place]) 10/02/2013  Absence of bladder continence 09/25/2012   Acontractile bladder 02/12/2012   Narrowing of intervertebral disc space 11/26/2011   Kyphoscoliosis and scoliosis 11/26/2011   PCP:  Kirk Ruths, MD Pharmacy:   [place], [place] - [address] [place] [address] Phone: [phone] Fax: [id number], [place] [address] Phone: [phone] Fax: [phone]     Social Determinants of Health  (SDOH) Interventions    Readmission Risk Interventions    03/26/2021    4:21 PM 01/03/2020    3:37 PM 04/22/2019   10:50 AM  Readmission Risk Prevention Plan  Transportation Screening Complete Complete Complete  PCP or Specialist Appt within 3-5 Days Complete    Social Work Consult for [place] Planning/Counseling Complete    Palliative Care Screening Complete    Medication Review Press photographer) Complete Complete Referral to Pharmacy  PCP or Specialist appointment within 3-5 days of discharge  Complete Complete  HRI or Home Care Consult  Complete Complete  SW Recovery Care/Counseling Consult  Complete Complete  Palliative Care Screening  Not Applicable Not Applicable  Skilled Nursing Facility   Complete

## 2021-12-23 NOTE — Consult Note (Signed)
Pharmacy Antibiotic Note  [name] is a 71 y.o. female admitted on 12/22/2021 with  left heel wound . Pharmacy has been consulted for vancomycin and cefepime dosing.   Plan:  Cefepime 2 g IV q12h  Loading dose of vancomycin 2 g was not completed yesterday (second ordered dose of 1 g was not administered)  Will give vancomycin 1.5 g IV this AM followed by vancomycin 1 g IV q24h --Calculated AUC: 490, Cmin 13.9 --Daily Scr per protocol --Levels at steady state or as clinically indicated  Height: '5\' 4"'$  (162.6 cm) Weight: 125 kg (275 lb 9.2 oz) IBW/kg (Calculated) : 54.7  Temp (24hrs), Avg:98.4 F (36.9 C), Min:98 F (36.7 C), Max:98.8 F (37.1 C)  Recent Labs  Lab 12/22/21 0838 12/23/21 0617  WBC 6.7 6.5  CREATININE 1.57* 1.33*  LATICACIDVEN 1.0  --      Estimated Creatinine Clearance: 51.4 mL/min (A) (by C-G formula based on SCr of 1.33 mg/dL (H)).    No Known Allergies  Antimicrobials this admission: Cefepime 8/26 >>  Vancomycin 8/26 >>   Dose adjustments this admission: N/A  Microbiology results: 8/26 BCx: 1/4 bottles GPC, BCID detected both Streptococcus species and MRSE  Thank you for allowing pharmacy to be a part of this patient's care.  Benita Gutter 12/23/2021 8:05 AM

## 2021-12-23 NOTE — Plan of Care (Signed)

## 2021-12-24 ENCOUNTER — Encounter
Admission: EM | Disposition: A | Discharge status: Home Health | Payer: Self-pay | Source: Home / Self Care | Attending: Internal Medicine

## 2021-12-24 ENCOUNTER — Inpatient Hospital Stay: Payer: Medicare HMO | Admitting: Anesthesiology

## 2021-12-24 ENCOUNTER — Other Ambulatory Visit: Payer: Self-pay

## 2021-12-24 DIAGNOSIS — S91302A Unspecified open wound, left foot, initial encounter: Secondary | ICD-10-CM | POA: Diagnosis not present

## 2021-12-24 DIAGNOSIS — L97429 Non-pressure chronic ulcer of left heel and midfoot with unspecified severity: Secondary | ICD-10-CM | POA: Diagnosis not present

## 2021-12-24 HISTORY — PX: BONE BIOPSY: SHX375

## 2021-12-24 HISTORY — PX: WOUND DEBRIDEMENT: SHX247

## 2021-12-24 LAB — BASIC METABOLIC PANEL
Anion gap: 7 (ref 5–15)
BUN: 20 mg/dL (ref 8–23)
CO2: 24 mmol/L (ref 22–32)
Calcium: 8.6 mg/dL — ABNORMAL LOW (ref 8.9–10.3)
Chloride: 108 mmol/L (ref 98–111)
Creatinine, Ser: 1.27 mg/dL — ABNORMAL HIGH (ref 0.44–1.00)
GFR, Estimated: 45 mL/min — ABNORMAL LOW (ref >60)
Glucose, Bld: 89 mg/dL (ref 70–99)
Potassium: 3.6 mmol/L (ref 3.5–5.1)
Sodium: 139 mmol/L (ref 135–145)

## 2021-12-24 SURGERY — DEBRIDEMENT, WOUND
Anesthesia: General | Laterality: Left

## 2021-12-24 MED ORDER — OXYCODONE HCL 5 MG PO TABS: 5.0000 mg | ORAL_TABLET | Freq: Once | ORAL | Status: DC | PRN

## 2021-12-24 MED ORDER — ACETAMINOPHEN 10 MG/ML IV SOLN
INTRAVENOUS | Status: AC
  Filled 2021-12-24: qty 100

## 2021-12-24 MED ORDER — FENTANYL CITRATE (PF) 100 MCG/2ML IJ SOLN
INTRAMUSCULAR | Status: AC
  Filled 2021-12-24: qty 2

## 2021-12-24 MED ORDER — DEXAMETHASONE SODIUM PHOSPHATE 10 MG/ML IJ SOLN
INTRAMUSCULAR | Status: DC | PRN
  Administered 2021-12-24: 10 mg via INTRAVENOUS

## 2021-12-24 MED ORDER — ONDANSETRON HCL 4 MG/2ML IJ SOLN
INTRAMUSCULAR | Status: DC | PRN
  Administered 2021-12-24: 4 mg via INTRAVENOUS

## 2021-12-24 MED ORDER — FENTANYL CITRATE (PF) 100 MCG/2ML IJ SOLN
INTRAMUSCULAR | Status: DC | PRN
  Administered 2021-12-24 (×2): 50 ug via INTRAVENOUS

## 2021-12-24 MED ORDER — FENTANYL CITRATE (PF) 100 MCG/2ML IJ SOLN: 25.0000 ug | INTRAMUSCULAR | Status: DC | PRN

## 2021-12-24 MED ORDER — BUPIVACAINE HCL (PF) 0.5 % IJ SOLN
INTRAMUSCULAR | Status: AC
  Filled 2021-12-24: qty 30

## 2021-12-24 MED ORDER — ACETAMINOPHEN 10 MG/ML IV SOLN
1000.0000 mg | Freq: Once | INTRAVENOUS | Status: AC
Start: 2021-12-24 — End: 2021-12-24
  Administered 2021-12-24: 1000 mg via INTRAVENOUS

## 2021-12-24 MED ORDER — LIDOCAINE HCL (CARDIAC) PF 100 MG/5ML IV SOSY
PREFILLED_SYRINGE | INTRAVENOUS | Status: DC | PRN
  Administered 2021-12-24: 50 mg via INTRAVENOUS

## 2021-12-24 MED ORDER — 0.9 % SODIUM CHLORIDE (POUR BTL) OPTIME
TOPICAL | Status: DC | PRN
  Administered 2021-12-24: 500 mL

## 2021-12-24 MED ORDER — LIDOCAINE HCL (PF) 1 % IJ SOLN
INTRAMUSCULAR | Status: DC | PRN
  Administered 2021-12-24: 15 mL via SURGICAL_CAVITY

## 2021-12-24 MED ORDER — KETOROLAC TROMETHAMINE 30 MG/ML IJ SOLN
INTRAMUSCULAR | Status: DC | PRN
  Administered 2021-12-24: 30 mg via INTRAVENOUS

## 2021-12-24 MED ORDER — PROPOFOL 10 MG/ML IV BOLUS
INTRAVENOUS | Status: AC
  Filled 2021-12-24: qty 20

## 2021-12-24 MED ORDER — SUCCINYLCHOLINE CHLORIDE 200 MG/10ML IV SOSY
PREFILLED_SYRINGE | INTRAVENOUS | Status: DC | PRN
  Administered 2021-12-24: 100 mg via INTRAVENOUS

## 2021-12-24 MED ORDER — PHENYLEPHRINE HCL (PRESSORS) 10 MG/ML IV SOLN
INTRAVENOUS | Status: DC | PRN
  Administered 2021-12-24: 80 ug via INTRAVENOUS
  Administered 2021-12-24: 160 ug via INTRAVENOUS

## 2021-12-24 MED ORDER — GLYCOPYRROLATE 0.2 MG/ML IJ SOLN
INTRAMUSCULAR | Status: DC | PRN
  Administered 2021-12-24: .2 mg via INTRAVENOUS

## 2021-12-24 MED ORDER — LIDOCAINE HCL (PF) 1 % IJ SOLN
INTRAMUSCULAR | Status: AC
  Filled 2021-12-24: qty 30

## 2021-12-24 MED ORDER — OXYCODONE HCL 5 MG/5ML PO SOLN: 5.0000 mg | Freq: Once | ORAL | Status: DC | PRN

## 2021-12-24 MED ORDER — DAKINS (1/4 STRENGTH) 0.125 % EX SOLN
CUTANEOUS | Status: AC
  Filled 2021-12-24: qty 473

## 2021-12-24 MED ORDER — PROPOFOL 10 MG/ML IV BOLUS
INTRAVENOUS | Status: DC | PRN
  Administered 2021-12-24: 100 mg via INTRAVENOUS

## 2021-12-24 MED ORDER — LACTATED RINGERS IV SOLN: INTRAVENOUS | Status: DC | PRN

## 2021-12-24 SURGICAL SUPPLY — 59 items
BNDG COHESIVE 4X5 TAN STRL LF (GAUZE/BANDAGES/DRESSINGS) ×1 IMPLANT
BNDG COHESIVE 6X5 TAN ST LF (GAUZE/BANDAGES/DRESSINGS) ×1 IMPLANT
BNDG ELASTIC 4X5.8 VLCR STR LF (GAUZE/BANDAGES/DRESSINGS) ×1 IMPLANT
BNDG ESMARK 4X12 TAN STRL LF (GAUZE/BANDAGES/DRESSINGS) ×1 IMPLANT
BNDG GAUZE DERMACEA FLUFF 4 (GAUZE/BANDAGES/DRESSINGS) ×1 IMPLANT
BNDG STRETCH GAUZE 3IN X12FT (GAUZE/BANDAGES/DRESSINGS) ×1 IMPLANT
CUFF TOURN SGL QUICK 18X4 (TOURNIQUET CUFF) IMPLANT
CUFF TOURN SGL QUICK 24 (TOURNIQUET CUFF)
CUFF TRNQT CYL 24X4X16.5-23 (TOURNIQUET CUFF) IMPLANT
DRSG EMULSION OIL 3X8 NADH (GAUZE/BANDAGES/DRESSINGS) ×1 IMPLANT
DURAPREP 26ML APPLICATOR (WOUND CARE) ×1 IMPLANT
ELECT REM PT RETURN 9FT ADLT (ELECTROSURGICAL) ×1
ELECTRODE REM PT RTRN 9FT ADLT (ELECTROSURGICAL) ×1 IMPLANT
GAUZE PACKING 1/4 X5 YD (GAUZE/BANDAGES/DRESSINGS) IMPLANT
GAUZE PACKING IODOFORM 1X5 (PACKING) IMPLANT
GAUZE SPONGE 4X4 12PLY STRL (GAUZE/BANDAGES/DRESSINGS) ×1 IMPLANT
GAUZE STRETCH 2X75IN STRL (MISCELLANEOUS) ×1 IMPLANT
GLOVE BIOGEL PI IND STRL 8 (GLOVE) ×1 IMPLANT
GLOVE BIOGEL PI INDICATOR 8 (GLOVE) ×1
GLOVE SURG ENC TEXT LTX SZ8 (GLOVE) ×1 IMPLANT
GOWN STRL REUS W/ TWL XL LVL3 (GOWN DISPOSABLE) ×1 IMPLANT
GOWN STRL REUS W/TWL MED LVL3 (GOWN DISPOSABLE) ×1 IMPLANT
GOWN STRL REUS W/TWL XL LVL3 (GOWN DISPOSABLE) ×1
HANDPIECE VERSAJET DEBRIDEMENT (MISCELLANEOUS) IMPLANT
IV NS 1000ML (IV SOLUTION) ×1
IV NS 1000ML BAXH (IV SOLUTION) ×1 IMPLANT
IV NS IRRIG 3000ML ARTHROMATIC (IV SOLUTION) IMPLANT
KIT TURNOVER KIT A (KITS) ×1 IMPLANT
LABEL OR SOLS (LABEL) ×1 IMPLANT
MANIFOLD NEPTUNE II (INSTRUMENTS) ×1 IMPLANT
NDL BIOPSY JAMSHIDI 11X6 (NEEDLE) IMPLANT
NDL FILTER BLUNT 18X1 1/2 (NEEDLE) ×1 IMPLANT
NDL HYPO 25X1 1.5 SAFETY (NEEDLE) ×1 IMPLANT
NEEDLE BIOPSY JAMSHIDI 11X6 (NEEDLE) ×1 IMPLANT
NEEDLE FILTER BLUNT 18X 1/2SAF (NEEDLE) ×1
NEEDLE FILTER BLUNT 18X1 1/2 (NEEDLE) ×1 IMPLANT
NEEDLE HYPO 25X1 1.5 SAFETY (NEEDLE) ×1 IMPLANT
NS IRRIG 500ML POUR BTL (IV SOLUTION) ×1 IMPLANT
PACK EXTREMITY ARMC (MISCELLANEOUS) ×1 IMPLANT
PAD ABD DERMACEA PRESS 5X9 (GAUZE/BANDAGES/DRESSINGS) ×1 IMPLANT
PULSAVAC PLUS IRRIG FAN TIP (DISPOSABLE)
SOL PREP POV-IOD 4OZ 10% (MISCELLANEOUS) IMPLANT
SOL PREP PVP 2OZ (MISCELLANEOUS) ×1
SOLUTION PREP PVP 2OZ (MISCELLANEOUS) ×1 IMPLANT
STAPLER SKIN PROX 35W (STAPLE) IMPLANT
STOCKINETTE IMPERVIOUS 9X36 MD (GAUZE/BANDAGES/DRESSINGS) ×1 IMPLANT
SUT ETHILON 2 0 FS 18 (SUTURE) IMPLANT
SUT ETHILON 4-0 (SUTURE)
SUT ETHILON 4-0 FS2 18XMFL BLK (SUTURE)
SUT PROLENE 3 0 PS 2 (SUTURE) IMPLANT
SUT VIC AB 3-0 SH 27 (SUTURE)
SUT VIC AB 3-0 SH 27X BRD (SUTURE) IMPLANT
SUT VIC AB 4-0 FS2 27 (SUTURE) IMPLANT
SUTURE ETHLN 4-0 FS2 18XMF BLK (SUTURE) IMPLANT
SWAB CULTURE AMIES ANAERIB BLU (MISCELLANEOUS) IMPLANT
SYR 10ML LL (SYRINGE) ×2 IMPLANT
TIP FAN IRRIG PULSAVAC PLUS (DISPOSABLE) IMPLANT
TRAP FLUID SMOKE EVACUATOR (MISCELLANEOUS) ×1 IMPLANT
WATER STERILE IRR 500ML POUR (IV SOLUTION) ×1 IMPLANT

## 2021-12-24 NOTE — Interval H&P Note (Signed)
History and Physical Interval Note:  12/24/2021 5:51 PM  [name]  has presented today for surgery, with the diagnosis of debridement left heel ulcer biopsy left heel bone.  The various methods of treatment have been discussed with the patient and family. After consideration of risks, benefits and other options for treatment, the patient has consented to  Procedure(s): DEBRIDEMENT WOUND-HEEL ULCER (Left) BONE BIOPSY-HEEL BONE (Left) as a surgical intervention.  The patient's history has been reviewed, patient examined, no change in status, stable for surgery.  I have reviewed the patient's chart and labs.  Questions were answered to the patient's satisfaction.    I discussed with her the surgery and also we reviewed the MRI that is concerning for osteomyelitis. We discussed possible need to return to the OR at some point if needed for the ulcer or bone the infection.    Trula Slade

## 2021-12-24 NOTE — Anesthesia Postprocedure Evaluation (Signed)
Anesthesia Post Note  Patient: [name]  Procedure(s) Performed: Gennie Alma ULCER (Left) BONE BIOPSY-HEEL BONE (Left)  Patient location during evaluation: PACU Anesthesia Type: General Level of consciousness: awake and alert Pain management: pain level controlled Vital Signs Assessment: post-procedure vital signs reviewed and stable Respiratory status: spontaneous breathing, nonlabored ventilation, respiratory function stable and patient connected to nasal cannula oxygen Cardiovascular status: blood pressure returned to baseline and stable Postop Assessment: no apparent nausea or vomiting Anesthetic complications: no   No notable events documented.   Last Vitals:  Vitals:   12/24/21 2038 12/24/21 2137  BP: (!) 129/50 105/73  Pulse: (!) 51 (!) 52  Resp: 20 16  Temp: 36.6 C 36.7 C  SpO2: 94% 96%    Last Pain:  Vitals:   12/24/21 2137  TempSrc: Oral  PainSc:                  Dimas Millin

## 2021-12-24 NOTE — Assessment & Plan Note (Signed)
-   Continue with vancomycin and cefepime

## 2021-12-24 NOTE — Anesthesia Preprocedure Evaluation (Signed)
Anesthesia Evaluation  Patient identified by MRN, date of birth, ID band Patient awake    Reviewed: Allergy & Precautions, NPO status , Patient's Chart, lab work & pertinent test results  History of Anesthesia Complications Negative for: history of anesthetic complications  Airway Mallampati: IV  TM Distance: >3 FB Neck ROM: Full    Dental  (+) Poor Dentition, Chipped, Missing, Loose, Dental Advidsory Given   Pulmonary shortness of breath and with exertion, sleep apnea , COPD, former smoker,    Pulmonary exam normal        Cardiovascular hypertension, negative cardio ROS Normal cardiovascular exam  ECHO 6/21   1. Left ventricular ejection fraction, by estimation, is 55 to 60%. The left ventricle has normal function. The left ventricle has no regional wall motion abnormalities. There is moderate asymmetric left ventricular hypertrophy of the septal segment.  Left ventricular diastolic parameters are consistent with Grade I diastolic dysfunction (impaired relaxation). Elevated left atrial pressure. 2. Right ventricular systolic function is normal. The right ventricular size is normal. Tricuspid regurgitation signal is inadequate for assessing PA pressure. 3. The mitral valve was not well visualized. Trivial mitral valve regurgitation. No evidence of mitral stenosis. 4. The aortic valve was not well visualized. Aortic valve regurgitation is not visualized. No aortic stenosis is present.   Neuro/Psych  Headaches, PSYCHIATRIC DISORDERS Anxiety Depression  Neuromuscular disease    GI/Hepatic negative GI ROS, Neg liver ROS,   Endo/Other  negative endocrine ROS  Renal/GU Renal InsufficiencyRenal diseasenegative Renal ROS     Musculoskeletal negative musculoskeletal ROS (+)   Abdominal Normal abdominal exam  (+) + obese,   Peds  Hematology  (+) Blood dyscrasia, anemia , Lymphoma   Anesthesia Other Findings    Reproductive/Obstetrics                             Anesthesia Physical  Anesthesia Plan  ASA: 3  Anesthesia Plan: General   Post-op Pain Management:    Induction: Intravenous  PONV Risk Score and Plan: 3 and Ondansetron, Treatment may vary due to age or medical condition, Midazolam and Dexamethasone  Airway Management Planned: Oral ETT  Additional Equipment:   Intra-op Plan:   Post-operative Plan: Extubation in OR  Informed Consent: I have reviewed the patients History and Physical, chart, labs and discussed the procedure including the risks, benefits and alternatives for the proposed anesthesia with the patient or authorized representative who has indicated his/her understanding and acceptance.     Dental Advisory Given  Plan Discussed with: Anesthesiologist, CRNA and Surgeon  Anesthesia Plan Comments: (Patient consented for risks of anesthesia including but not limited to:  - adverse reactions to medications - damage to eyes, teeth, lips or other oral mucosa - nerve damage due to positioning  - sore throat or hoarseness - Damage to heart, brain, nerves, lungs, other parts of body or loss of life  Patient voiced understanding.)        Anesthesia Quick Evaluation

## 2021-12-24 NOTE — Progress Notes (Signed)
Progress Note   Patient: [name] NKN:[id number] DOB: 1950-08-01 DOA: 12/22/2021     1 DOS: the patient was seen and examined on 12/24/2021   Brief hospital course: Ms. Shebra Muldrow is a 71 year old female with depression, anxiety, neuropathy, hypertension, insomnia, morbid obesity, who presents emergency department for chief concerns of nonhealing left heel wound.  Per patient the wound has been ongoing for the last 1 to 2 years.  Initial vitals in the emergency department showed temperature 98.4, respiration rate of 18, heart rate of 58, blood pressure 113/50, SPO2 of 94% on room air.  Serum sodium is 139, potassium 4.2, chloride 107, bicarb 25, BUN of 27, serum creatinine 1.57, nonfasting blood glucose 109, GFR of 35, WBC 6.7, hemoglobin 9.7, platelets of 165.  Lactic acid was 1.0.  ED treatment: Cefepime 2 g IV, vancomycin.  8/27; patient remained afebrile, no leukocytosis.  1/4 blood culture bottles with staph epi and Streptococcus species no further characterization.  Most likely a contaminant but patient do have an open wound.  Currently on cefepime and vancomycin. Left foot x-ray with no osseous abnormalities. Ordered left foot MRI to rule out osteomyelitis Podiatry to take her to the OR tomorrow for debridement and possible bone biopsy.  8/28: Patient remained stable.  Renal function seems improving, creatinine at 1.27 today.  Blood cultures remain negative. MRI left heel with some concern of early osteomyelitis although it was motion degraded picture.  Going to the OR for debridement and bone biopsy later today with podiatry.   Assessment and Plan: * Non healing left heel wound - Status post cefepime and vancomycin per ED, these antibiotics will be continued. MRI with concern of early osteomyelitis. - ABI of the lower extremities were normal -Podiatry to take her to the OR today afternoon for debridement and bone biopsy. -Continue with antibiotics  Cellulitis of left  lower leg - Continue with vancomycin and cefepime  Essential hypertension - Resumed home amlodipine 5 mg daily, atorvastatin 10 mg nightly, hydralazine 50 mg p.o. 3 times daily - Labetalol 5 mg IV every 3 hours as needed for SBP greater than 180, 4 doses ordered  Atonic neurogenic bladder - Chronic indwelling Foley catheter in place, per patient last replacement was 1 week ago - Patient has had chronic indwelling Foley catheter for approximately 8 to 10 years  Chronic kidney disease (CKD), stage III (moderate) (HCC) Creatinine with some improvement to 1.33 with IV fluid.  Baseline appears to be between 1.3-1.5. Consistent with CKD stage IIIa -Seems stable -Monitor renal function -Avoid nephrotoxins  Major depressive disorder, recurrent, in partial remission (HCC) - Resumed home bupropion 300 mg daily, duloxetine 60 mg every morning, trazodone 50 mg nightly, Trintellix 5 mg daily resumed - Quetiapine 150 mg nightly resume  Obesity, Class III, BMI 40-49.9 (morbid obesity) ([place]) Estimated body mass index is 47.3 kg/m as calculated from the following:   Height as of this encounter: '5\' 4"'$  (1.626 m).   Weight as of this encounter: 125 kg.  - This complicates overall care and prognosis.   COPD (chronic obstructive pulmonary disease) (HCC) - Not in acute exacerbation - Resumed home maintenance inhaler twice daily - Albuterol nebulizer as needed as needed for shortness of breath and wheezing  Hyperlipidemia - Atorvastatin 10 mg nightly resumed     Subjective: Patient was seen and examined today.  No new complaint.  Waiting for her procedure.  Physical Exam: Vitals:   12/23/21 3790 12/24/21 2409 12/24/21 0935 12/24/21 1503  BP: (!) 126/46 (!) 115/59 (!) 123/54 111/65  Pulse: (!) 46 64 63 (!) 52  Resp: '15 18  12  '$ Temp: 98.9 F (37.2 C) 97.9 F (36.6 C)  98.4 F (36.9 C)  TempSrc: Oral Oral  Oral  SpO2: 96% 95% 100% 97%  Weight:      Height:       General.  Morbidly obese  lady, in no acute distress. Pulmonary.  Lungs clear bilaterally, normal respiratory effort. CV.  Regular rate and rhythm, no JVD, rub or murmur. Abdomen.  Soft, nontender, nondistended, BS positive. CNS.  Alert and oriented .  No focal neurologic deficit. Extremities.  No edema, no cyanosis, pulses intact and symmetrical.  Left heel with large ulcer, improved erythema and edema. Psychiatry.  Judgment and insight appears normal.  Data Reviewed: Prior data reviewed  Family Communication: Discussed with patient  Disposition: Status is: Inpatient Remains inpatient appropriate because: Severe tearfulness   Planned Discharge Destination: Home with Home Health  Time spent: 43 minutes  This record has been created using Systems analyst. Errors have been sought and corrected,but may not always be located. Such creation errors do not reflect on the standard of care.  Author: Lorella Nimrod, MD 12/24/2021 3:37 PM  For on call review [email or web address].

## 2021-12-24 NOTE — Assessment & Plan Note (Signed)
-   Status post cefepime and vancomycin per ED, these antibiotics will be continued. MRI with concern of early osteomyelitis. - ABI of the lower extremities were normal -Podiatry to take her to the OR today afternoon for debridement and bone biopsy. -Continue with antibiotics

## 2021-12-24 NOTE — Op Note (Signed)
PATIENT:  [name]  71 y.o. female  PRE-OPERATIVE DIAGNOSIS:  debridement left heel ulcer biopsy left heel bone  POST-OPERATIVE DIAGNOSIS:  debridement left heel ulcer biopsy left heel bone  PROCEDURE:  Procedure(s): DEBRIDEMENT WOUND-HEEL ULCER (Left) BONE BIOPSY-HEEL BONE (Left)  SURGEON:  Surgeon(s) and Role:    * Trula Slade, DPM - Primary  PHYSICIAN ASSISTANT:   ASSISTANTS: none   ANESTHESIA:   general  EBL:  20 ml   BLOOD ADMINISTERED:none  DRAINS: none   LOCAL MEDICATIONS USED:  MARCAINE   , BUPIVICAINE , and Amount: 15 ml  SPECIMEN:  Source of Specimen:  left heel bone for pathology and microbiology  DISPOSITION OF SPECIMEN:  PATHOLOGY  COUNTS:  YES  TOURNIQUET:  * Missing tourniquet times found for documented tourniquets in log: [id number] *  DICTATION: .Dragon Dictation  PLAN OF CARE: Admit to inpatient   PATIENT DISPOSITION:  PACU - hemodynamically stable.   Delay start of Pharmacological VTE agent (>24hrs) due to surgical blood loss or risk of bleeding: no  Indications for surgery: 71 year old female admitted for left heel ulceration.  Given concern for infection MRI performed which was concerning for osteomyelitis.  Given these findings discussed wound debridement, bone biopsy.  We discussed the surgery as well as postoperative course.  Alternatives risks complications were discussed.  Patient agrees to surgery and consent was signed.  N.p.o. confirmed.  No promises or guarantees given as to the outcome of the procedure and all questions were answered best my ability.  Procedure in detail: The patient was both verbally and visually identified by myself, the nursing staff, the anesthesia staff preoperatively.  She was then transferred to the operating room via stretcher.  She was intubated and then placed on the operating table in supine position.  Bone foam was then applied to help elevate the foot.  Left lower extremities and scrubbed, prepped,  draped normal sterile fashion.  Timeout was performed.  Preoperatively evaluated the wound and this will be necrotic tissue present measuring 4 x 3 cm and superficial without any purulence.  There was superficial granulation tissue on the periphery of this as well.  I utilized a #15 blade scalpel to circumferentially excise the necrotic tissue down to healthy, bleeding, granular tissue.  There was no purulence noted.  There was no exposed bone although it was close to the calcaneus.  After I debrided the wound measured 5 x 4 x 0.8 cm.  Attention was directed to the lateral aspect of the wound in which a small stab incision was made for bone biopsy.  I utilized a Jamshidi needle to remove a piece of bone assessment for both pathology as well as microbiology.  Bone did appear to be soft.  I irrigated the wound in a single nylon suture was placed for closure.  Again inspected the wound and no purulence noted and hemostasis achieved.  I packed the wound with Dakin solution followed by 4 x 4's, ABD, Kerlix Ace bandage.  She was awoken from anesthesia and found to have tolerated the procedure well.  She was transferred to PACU vital signs stable and vascular status intact.  Postoperative course: Patient is to remain inpatient on antibiotics.  Await culture results and recommend infectious disease consultation. Podiatry will continue to follow.

## 2021-12-24 NOTE — Anesthesia Procedure Notes (Signed)
Procedure Name: Intubation Date/Time: 12/24/2021 6:10 PM  Performed by: Beverely Low, CRNAPre-anesthesia Checklist: Patient identified, Patient being monitored, Timeout performed, Emergency Drugs available and Suction available Patient Re-evaluated:Patient Re-evaluated prior to induction Oxygen Delivery Method: Circle system utilized Preoxygenation: Pre-oxygenation with 100% oxygen Induction Type: IV induction Ventilation: Mask ventilation without difficulty Laryngoscope Size: 3 and McGraph Grade View: Grade I Tube type: Oral Tube size: 7.0 mm Number of attempts: 1 Airway Equipment and Method: Stylet Placement Confirmation: ETT inserted through vocal cords under direct vision, positive ETCO2 and breath sounds checked- equal and bilateral Secured at: 21 cm Tube secured with: Tape Dental Injury: Teeth and Oropharynx as per pre-operative assessment

## 2021-12-24 NOTE — Transfer of Care (Addendum)
Immediate Anesthesia Transfer of Care Note  Patient: [name]  Procedure(s) Performed: Gennie Alma ULCER (Left) BONE BIOPSY-HEEL BONE (Left)  Patient Location: PACU  Anesthesia Type:General  Level of Consciousness: drowsy  Airway & Oxygen Therapy: Patient Spontanous Breathing and Patient connected to face mask oxygen  Post-op Assessment: Report given to RN and Post -op Vital signs reviewed and stable  Post vital signs: Reviewed and stable  Last Vitals:  Vitals Value Taken Time  BP    Temp    Pulse    Resp    SpO2      Last Pain:  Vitals:   12/24/21 1703  TempSrc: Temporal  PainSc: 0-No pain      Patients Stated Pain Goal: 3 (81/44/81 8563)  Complications: No notable events documented.

## 2021-12-25 ENCOUNTER — Encounter: Payer: Self-pay | Admitting: Podiatry

## 2021-12-25 DIAGNOSIS — N312 Flaccid neuropathic bladder, not elsewhere classified: Secondary | ICD-10-CM

## 2021-12-25 DIAGNOSIS — S91302A Unspecified open wound, left foot, initial encounter: Secondary | ICD-10-CM | POA: Diagnosis not present

## 2021-12-25 LAB — CREATININE, SERUM
Creatinine, Ser: 1.44 mg/dL — ABNORMAL HIGH (ref 0.44–1.00)
GFR, Estimated: 39 mL/min — ABNORMAL LOW (ref >60)

## 2021-12-25 MED ORDER — SODIUM CHLORIDE 0.9 % IV SOLN
3.0000 g | Freq: Four times a day (QID) | INTRAVENOUS | Status: DC
  Administered 2021-12-25 – 2021-12-26 (×4): 3 g via INTRAVENOUS
  Filled 2021-12-25: qty 3
  Filled 2021-12-25 (×3): qty 8
  Filled 2021-12-25: qty 3
  Filled 2021-12-25: qty 8

## 2021-12-25 NOTE — Assessment & Plan Note (Signed)
-   Continue with vancomycin and cefepime

## 2021-12-25 NOTE — Progress Notes (Signed)
End of shift note:   There were not any significant events during this shift. Left foot dressing is clean, dry and intact. VSS. Foley care was provided. IV antibiotics were given.

## 2021-12-25 NOTE — Consult Note (Signed)
NAME: [name]  DOB: 10/28/1950  MRN: [id number]  Date/Time: 12/25/2021 3:02 PM  REQUESTING PROVIDER: Dr. Reesa Chew Subjective:  REASON FOR CONSULT: strep bacteremia ? [name] is a 71 y.o. female with a history of MS, neurogenic bladder, chronic foley , treated follicular lymphoma, HTN, lumbar fusion presented to the hospital with wound on the left foot for many months to year  12/22/21  BP 122/51 !  Temp 98 F (36.7 C)  Pulse Rate 52 !  Resp 20  SpO2 97 %    Latest Reference Range & Units 12/22/21  WBC 4.0 - 10.5 K/uL 6.7  Hemoglobin 12.0 - 15.0 g/dL 9.7 (L)  HCT 36.0 - 46.0 % 31.2 (L)  Platelets 150 - 400 K/uL 165  Creatinine 0.44 - 1.00 mg/dL 1.57 (H)   Was started on vanco and cefepime Pt was seen by podiatrist and underwent debridement   Past Medical History:  Diagnosis Date   Abdominal aortic atherosclerosis ([place]) 11/11/2016   ADHD    Anxiety    COPD (chronic obstructive pulmonary disease) (HCC)    Depression    major depressive   Dyspnea    doe   Edema    left leg   Follicular lymphoma ([place])    B Cell   Follicular lymphoma grade II ([place])    Hypertension    Hypotension    idiopathic   Kyphoscoliosis and scoliosis 11/26/2011   Morbid obesity ([place]) 01/05/2016   Multiple sclerosis (HCC)    Multiple sclerosis (HCC)    1980's   Neuromuscular disorder (HCC)    Obstructive and reflux uropathy    foley   Pain    atypical facial   Peripheral vascular disease of lower extremity with ulceration ([place]) 11/08/2015   Skin ulcer ([place]) 11/08/2015   Weakness    generalized. has MS    Past Surgical History:  Procedure Laterality Date   BACK SURGERY N/A 2002   BONE BIOPSY Left 12/24/2021   Procedure: BONE BIOPSY-HEEL BONE;  Surgeon: Trula Slade, DPM;  Location: [facility];  Service: Podiatry;  Laterality: Left;   CYST EXCISION     lower back   INGUINAL LYMPH NODE BIOPSY Left 07/04/2016   Procedure: INGUINAL LYMPH NODE BIOPSY;  Surgeon: Christene Lye, MD;   Location: [facility];  Service: General;  Laterality: Left;   ORIF FEMUR FRACTURE Left 02/04/2020   Procedure: OPEN REDUCTION INTERNAL FIXATION (ORIF) DISTAL FEMUR FRACTURE;  Surgeon: Shona Needles, MD;  Location: [name];  Service: Orthopedics;  Laterality: Left;   PORTACATH PLACEMENT N/A 07/22/2016   Procedure: INSERTION PORT-A-CATH;  Surgeon: Christene Lye, MD;  Location: [facility];  Service: General;  Laterality: N/A;   TONSILLECTOMY AND ADENOIDECTOMY     TUBAL LIGATION     WOUND DEBRIDEMENT Left 12/24/2021   Procedure: DEBRIDEMENT WOUND-HEEL ULCER;  Surgeon: Trula Slade, DPM;  Location: [facility];  Service: Podiatry;  Laterality: Left;    Social History   Socioeconomic History   Marital status: Married    Spouse name: Not on file   Number of children: 3   Years of education: Not on file   Highest education level: Not on file  Occupational History   Occupation: disabled  Tobacco Use   Smoking status: Former    Packs/day: 1.00    Years: 20.00    Total pack years: 20.00    Types: Cigarettes    Start date: 04/30/1995    Quit date: 02/03/2016  Years since quitting: 5.8   Smokeless tobacco: Never  Vaping Use   Vaping Use: Some days  Substance and Sexual Activity   Alcohol use: No    Alcohol/week: 0.0 standard drinks of alcohol   Drug use: Yes    Types: Marijuana    Comment: smokes THC occasionally per pt    Sexual activity: Not Currently    Birth control/protection: None  Other Topics Concern   Not on file  Social History Narrative   She is married.    Social Determinants of Health   Financial Resource Strain: Not on file  Food Insecurity: Not on file  Transportation Needs: Not on file  Physical Activity: Not on file  Stress: Not on file  Social Connections: Not on file  Intimate Partner Violence: Unknown (06/13/2017)   Humiliation, Afraid, Rape, and Kick questionnaire    Fear of Current or Ex-Partner: Not on file    Emotionally Abused: Not on file     Physically Abused: Not asked    Sexually Abused: Not on file    Family History  Problem Relation Age of Onset   COPD Mother    Diabetes Mother    Heart failure Mother    Alcohol abuse Father    Kidney disease Father    Kidney failure Father    Arthritis Sister    CAD Maternal Grandmother    Stroke Maternal Grandfather    Arthritis Sister    Mental illness Sister    Arthritis Brother    No Known Allergies I? Current Facility-Administered Medications  Medication Dose Route Frequency Provider Last Rate Last Admin   acetaminophen (TYLENOL) tablet 650 mg  650 mg Oral Q6H PRN Cox, Amy N, DO       Or   acetaminophen (TYLENOL) suppository 650 mg  650 mg Rectal Q6H PRN Cox, Amy N, DO       albuterol (PROVENTIL) (2.5 MG/3ML) 0.083% nebulizer solution 2.5 mg  2.5 mg Nebulization Q6H PRN Cox, Amy N, DO       amLODipine (NORVASC) tablet 5 mg  5 mg Oral Daily Cox, Amy N, DO   5 mg at 12/24/21 5427   atorvastatin (LIPITOR) tablet 10 mg  10 mg Oral QHS Cox, Amy N, DO   10 mg at 12/24/21 2054   buPROPion (WELLBUTRIN XL) 24 hr tablet 300 mg  300 mg Oral Daily Cox, Amy N, DO   300 mg at 12/25/21 0846   ceFEPIme (MAXIPIME) 2 g in sodium chloride 0.9 % 100 mL IVPB  2 g Intravenous Q12H Cox, Amy N, DO 200 mL/hr at 12/25/21 0829 2 g at 12/25/21 0623   Chlorhexidine Gluconate Cloth 2 % PADS 6 each  6 each Topical Daily Lorella Nimrod, MD   6 each at 12/25/21 7628   clonazePAM (KLONOPIN) tablet 0.5 mg  0.5 mg Oral BID Cox, Amy N, DO   0.5 mg at 12/25/21 0846   docusate sodium (COLACE) capsule 100 mg  100 mg Oral BID PRN Cox, Amy N, DO       DULoxetine (CYMBALTA) DR capsule 60 mg  60 mg Oral BH-q7a Cox, Amy N, DO   60 mg at 12/25/21 0844   gabapentin (NEURONTIN) tablet 600 mg  600 mg Oral TID Cox, Amy N, DO   600 mg at 12/25/21 1458   heparin injection 5,000 Units  5,000 Units Subcutaneous Q8H Cox, Amy N, DO   5,000 Units at 12/25/21 1458   hydrALAZINE (APRESOLINE) tablet 50 mg  50  mg Oral TID Cox, Amy N,  DO   50 mg at 12/25/21 1458   labetalol (NORMODYNE) injection 5 mg  5 mg Intravenous Q3H PRN Cox, Amy N, DO       mometasone-formoterol (DULERA) 200-5 MCG/ACT inhaler 2 puff  2 puff Inhalation BID Cox, Amy N, DO   2 puff at 12/25/21 0847   morphine (PF) 2 MG/ML injection 2 mg  2 mg Intravenous Q4H PRN Cox, Amy N, DO       multivitamin with minerals tablet 1 tablet  1 tablet Oral Daily Cox, Amy N, DO   1 tablet at 12/25/21 0932   nicotine (NICODERM CQ - dosed in mg/24 hours) patch 21 mg  21 mg Transdermal Daily PRN Cox, Amy N, DO       ondansetron (ZOFRAN) tablet 4 mg  4 mg Oral Q6H PRN Cox, Amy N, DO       Or   ondansetron (ZOFRAN) injection 4 mg  4 mg Intravenous Q6H PRN Cox, Amy N, DO       QUEtiapine (SEROQUEL XR) 24 hr tablet 150 mg  150 mg Oral QHS Cox, Amy N, DO   150 mg at 12/24/21 2054   senna-docusate (Senokot-S) tablet 1 tablet  1 tablet Oral QHS PRN Cox, Amy N, DO       traZODone (DESYREL) tablet 50 mg  50 mg Oral QHS Cox, Amy N, DO   50 mg at 12/24/21 2055   vortioxetine HBr (TRINTELLIX) tablet 5 mg  5 mg Oral Daily Cox, Amy N, DO   5 mg at 12/25/21 0831   Facility-Administered Medications Ordered in Other Encounters  Medication Dose Route Frequency Provider Last Rate Last Admin   heparin lock flush 100 unit/mL  500 Units Intracatheter Once PRN Sindy Guadeloupe, MD         Abtx:  Anti-infectives (From admission, onward)    Start     Dose/Rate Route Frequency Ordered Stop   12/24/21 1400  vancomycin (VANCOREADY) IVPB 1750 mg/350 mL  Status:  Discontinued        1,750 mg 175 mL/hr over 120 Minutes Intravenous Every 48 hours 12/22/21 1406 12/23/21 0803   12/24/21 1000  vancomycin (VANCOCIN) IVPB 1000 mg/200 mL premix  Status:  Discontinued        1,000 mg 200 mL/hr over 60 Minutes Intravenous Every 24 hours 12/23/21 0805 12/25/21 0848   12/23/21 1000  vancomycin (VANCOREADY) IVPB 1500 mg/300 mL        1,500 mg 150 mL/hr over 120 Minutes Intravenous  Once 12/23/21 0804 12/23/21  1216   12/22/21 2200  ceFEPIme (MAXIPIME) 2 g in sodium chloride 0.9 % 100 mL IVPB        2 g 200 mL/hr over 30 Minutes Intravenous Every 12 hours 12/22/21 1408     12/22/21 1500  vancomycin (VANCOCIN) IVPB 1000 mg/200 mL premix  Status:  Discontinued        1,000 mg 200 mL/hr over 60 Minutes Intravenous  Once 12/22/21 1405 12/23/21 0803   12/22/21 1130  vancomycin (VANCOCIN) IVPB 1000 mg/200 mL premix  Status:  Discontinued        1,000 mg 200 mL/hr over 60 Minutes Intravenous  Once 12/22/21 1126 12/22/21 1128   12/22/21 1130  piperacillin-tazobactam (ZOSYN) IVPB 3.375 g  Status:  Discontinued        3.375 g 100 mL/hr over 30 Minutes Intravenous  Once 12/22/21 1126 12/22/21 1128   12/22/21 1130  vancomycin (VANCOCIN) IVPB  1000 mg/200 mL premix        1,000 mg 200 mL/hr over 60 Minutes Intravenous  Once 12/22/21 1128 12/22/21 1451   12/22/21 1130  ceFEPIme (MAXIPIME) 2 g in sodium chloride 0.9 % 100 mL IVPB        2 g 200 mL/hr over 30 Minutes Intravenous  Once 12/22/21 1128 12/22/21 1335       REVIEW OF SYSTEMS:  Const: negative fever, negative chills, negative weight loss Eyes: negative diplopia or visual changes, negative eye pain ENT: negative coryza, negative sore throat Resp: negative cough, hemoptysis, dyspnea Cards: negative for chest pain, palpitations, lower extremity edema GU: foley GI: Negative for abdominal pain, diarrhea, bleeding, constipation Skin: negative for rash and pruritus Heme: negative for easy bruising and gum/nose bleeding MS: muscle weakness Neurolo:weakness, wheel chair bound  Psych:  anxiety, depression  Endocrine: negative for thyroid, diabetes Allergy/Immunology- negative for any medication or food allergies Objective:  VITALS:  BP (!) 121/49 (BP Location: Left Arm)   Pulse 60   Temp 98 F (36.7 C) (Oral)   Resp 20   Ht '5\' 4"'$  (1.626 m)   Wt 125 kg   SpO2 98%   BMI 47.30 kg/m  LDA Foley Central line Other drainage tubes PHYSICAL  EXAM:  General: Alert, cooperative, no distress, appears stated age.  Head: Normocephalic, without obvious abnormality, atraumatic. Eyes: Conjunctivae clear, anicteric sclerae. Pupils are equal ENT Nares normal. No drainage or sinus tenderness. Lips, mucosa, and tongue normal. No Thrush Nose- rt side pearly ulcerating nodule- looks BCC Neck:  symmetrical, no adenopathy, thyroid: non tender no carotid bruit and no JVD. Back: did not examine Lungs: Clear to auscultation bilaterally. No Wheezing or Rhonchi. No rales. Heart: Regular rate and rhythm, no murmur, rub or gallop. Abdomen: Soft, non-tender,not distended. Bowel sounds normal. No masses foley Extremities: left leg surgical dressing Pre surgery   Post surgery   Skin: No rashes or lesions. Or bruising Lymph: Cervical, supraclavicular normal. Neurologic: not assessed Pertinent Labs Lab Results CBC    Component Value Date/Time   WBC 6.5 12/23/2021 0617   RBC 3.39 (L) 12/23/2021 0617   HGB 9.3 (L) 12/23/2021 0617   HGB 13.6 08/01/2013 2312   HCT 29.7 (L) 12/23/2021 0617   HCT 40.2 08/01/2013 2312   PLT 159 12/23/2021 0617   PLT 274 08/01/2013 2312   MCV 87.6 12/23/2021 0617   MCV 87 08/01/2013 2312   MCH 27.4 12/23/2021 0617   MCHC 31.3 12/23/2021 0617   RDW 14.4 12/23/2021 0617   RDW 14.2 08/01/2013 2312   LYMPHSABS 0.6 (L) 12/22/2021 0838   LYMPHSABS 1.5 07/14/2013 1444   MONOABS 0.6 12/22/2021 0838   MONOABS 0.7 07/14/2013 1444   EOSABS 0.3 12/22/2021 0838   EOSABS 0.2 07/14/2013 1444   BASOSABS 0.1 12/22/2021 0838   BASOSABS 0.1 07/14/2013 1444       Latest Ref Rng & Units 12/25/2021    4:35 AM 12/24/2021    4:19 AM 12/23/2021    6:17 AM  CMP  Glucose 70 - 99 mg/dL  89  94   BUN 8 - 23 mg/dL  20  23   Creatinine 0.44 - 1.00 mg/dL 1.44  1.27  1.33   Sodium 135 - 145 mmol/L  139  138   Potassium 3.5 - 5.1 mmol/L  3.6  4.2   Chloride 98 - 111 mmol/L  108  108   CO2 22 - 32 mmol/L  24  23   Calcium  8.9 -  10.3 mg/dL  8.6  8.5       Microbiology: Recent Results (from the past 240 hour(s))  Culture, blood (routine x 2)     Status: Abnormal (Preliminary result)   Collection Time: 12/22/21 12:47 PM   Specimen: BLOOD  Result Value Ref Range Status   Specimen Description   Final    BLOOD RIGHT ANTECUBITAL Performed at [facility], [place] [address], [place], [place] [address]    Special Requests   Final    BOTTLES DRAWN AEROBIC AND ANAEROBIC Blood Culture results may not be optimal due to an excessive volume of blood received in culture bottles Performed at [facility], [address] [place], [place] [address]    Culture  Setup Time   Final    GRAM POSITIVE COCCI IN BOTH AEROBIC AND ANAEROBIC BOTTLES CRITICAL RESULT CALLED TO, READ BACK BY AND VERIFIED WITH: ALEX CHAPPEL 12/23/21 AT 0730 BY SB    Culture (A)  Final    STAPHYLOCOCCUS EPIDERMIDIS THE SIGNIFICANCE OF ISOLATING THIS ORGANISM FROM A SINGLE SET OF BLOOD CULTURES WHEN MULTIPLE SETS ARE DRAWN IS UNCERTAIN. PLEASE NOTIFY THE MICROBIOLOGY DEPARTMENT WITHIN ONE WEEK IF SPECIATION AND SENSITIVITIES ARE REQUIRED. CULTURE REINCUBATED FOR BETTER GROWTH Performed at [facility], [place] [address] [place], [place] [address]    Report Status PENDING  Incomplete  Blood Culture ID Panel (Reflexed)     Status: Abnormal   Collection Time: 12/22/21 12:47 PM  Result Value Ref Range Status   Enterococcus faecalis NOT DETECTED NOT DETECTED Final   Enterococcus Faecium NOT DETECTED NOT DETECTED Final   Listeria monocytogenes NOT DETECTED NOT DETECTED Final   Staphylococcus species DETECTED (A) NOT DETECTED Final    Comment: CRITICAL RESULT CALLED TO, READ BACK BY AND VERIFIED WITH: ALEX CHAPPEL 12/23/21 @ 0730 BY SB    Staphylococcus aureus (BCID) NOT DETECTED NOT DETECTED Final   Staphylococcus epidermidis DETECTED (A) NOT DETECTED Final    Comment: Methicillin (oxacillin) resistant coagulase negative staphylococcus.  Possible blood culture contaminant (unless isolated from more than one blood culture draw or clinical case suggests pathogenicity). No antibiotic treatment is indicated for blood  culture contaminants. CRITICAL RESULT CALLED TO, READ BACK BY AND VERIFIED WITH: ALEX CHAPPEL 12/23/21 @ 0730 BY SB    Staphylococcus lugdunensis NOT DETECTED NOT DETECTED Final   Streptococcus species DETECTED (A) NOT DETECTED Final    Comment: Not Enterococcus species, Streptococcus agalactiae, Streptococcus pyogenes, or Streptococcus pneumoniae. CRITICAL RESULT CALLED TO, READ BACK BY AND VERIFIED WITH: ALEX CHAPPEL 12/23/21 @ 0730 BY SB    Streptococcus agalactiae NOT DETECTED NOT DETECTED Final   Streptococcus pneumoniae NOT DETECTED NOT DETECTED Final   Streptococcus pyogenes NOT DETECTED NOT DETECTED Final   A.calcoaceticus-baumannii NOT DETECTED NOT DETECTED Final   Bacteroides fragilis NOT DETECTED NOT DETECTED Final   Enterobacterales NOT DETECTED NOT DETECTED Final   Enterobacter cloacae complex NOT DETECTED NOT DETECTED Final   Escherichia coli NOT DETECTED NOT DETECTED Final   Klebsiella aerogenes NOT DETECTED NOT DETECTED Final   Klebsiella oxytoca NOT DETECTED NOT DETECTED Final   Klebsiella pneumoniae NOT DETECTED NOT DETECTED Final   Proteus species NOT DETECTED NOT DETECTED Final   Salmonella species NOT DETECTED NOT DETECTED Final   Serratia marcescens NOT DETECTED NOT DETECTED Final   Haemophilus influenzae NOT DETECTED NOT DETECTED Final   Neisseria meningitidis NOT DETECTED NOT DETECTED Final   Pseudomonas aeruginosa NOT DETECTED NOT DETECTED Final  Stenotrophomonas maltophilia NOT DETECTED NOT DETECTED Final   Candida albicans NOT DETECTED NOT DETECTED Final   Candida auris NOT DETECTED NOT DETECTED Final   Candida glabrata NOT DETECTED NOT DETECTED Final   Candida krusei NOT DETECTED NOT DETECTED Final   Candida parapsilosis NOT DETECTED NOT DETECTED Final   Candida tropicalis NOT  DETECTED NOT DETECTED Final   Cryptococcus neoformans/gattii NOT DETECTED NOT DETECTED Final   Methicillin resistance mecA/C DETECTED (A) NOT DETECTED Final    Comment: CRITICAL RESULT CALLED TO, READ BACK BY AND VERIFIED WITH: ALEX CHAPPEL 12/23/21 @ 0730 BY SB Performed at [facility], [address] [place], [place] [address]   Culture, blood (routine x 2)     Status: None (Preliminary result)   Collection Time: 12/22/21 12:48 PM   Specimen: BLOOD  Result Value Ref Range Status   Specimen Description BLOOD LEFT [facility]  Final   Special Requests   Final    BOTTLES DRAWN AEROBIC AND ANAEROBIC Blood Culture adequate volume   Culture   Final    NO GROWTH 3 DAYS Performed at [facility], [address] [place], [place] [address]    Report Status PENDING  Incomplete  Culture, blood (Routine X 2) w Reflex to ID Panel     Status: None (Preliminary result)   Collection Time: 12/23/21  8:28 AM   Specimen: BLOOD RIGHT HAND  Result Value Ref Range Status   Specimen Description BLOOD RIGHT HAND  Final   Special Requests   Final    BOTTLES DRAWN AEROBIC AND ANAEROBIC Blood Culture adequate volume   Culture   Final    NO GROWTH 2 DAYS Performed at [facility], [address], [place], [place] [address]    Report Status PENDING  Incomplete  Culture, blood (Routine X 2) w Reflex to ID Panel     Status: None (Preliminary result)   Collection Time: 12/23/21  8:34 AM   Specimen: BLOOD  Result Value Ref Range Status   Specimen Description BLOOD LEFT ANTECUBITAL  Final   Special Requests   Final    BOTTLES DRAWN AEROBIC AND ANAEROBIC Blood Culture adequate volume   Culture   Final    NO GROWTH 2 DAYS Performed at [facility], [address] [place], [place] [address]    Report Status PENDING  Incomplete  Aerobic/Anaerobic Culture w Gram Stain (surgical/deep wound)     Status: None (Preliminary result)   Collection Time: 12/24/21  6:32 PM   Specimen:  PATH Other; Tissue  Result Value Ref Range Status   Specimen Description   Final    BONE Performed at [facility], [address] [place], [place] [address]    Special Requests   Final    NONE Performed at [facility], [address] [place], [place] [address]    Gram Stain NO WBC SEEN NO ORGANISMS SEEN   Final   Culture   Final    NO GROWTH < 12 HOURS Performed at [facility], [place] [address], [place], [place] [address]    Report Status PENDING  Incomplete    IMAGING RESULTS:  No osteo of the heel I have personally reviewed the films ? Impression/Recommendation ? ?Left heel wound  secondary pressure . Underwent debridement Bone culture/biopsy sent Will change vanco and cefepime to unasyn Will decide on duration depending on the culture and biopsy Would benefit from wound vac  Strep and staph epi in the blood  culture- could be contaminant  Multiple sclerosis followed by neurology but has been non compliant  Neurogenic bladder- has foley  Rt side of nose- lesion- likely BCC- need to see dermatology? ___________________________________________________ Discussed with patient, requesting provider Note:  This document was prepared using Dragon voice recognition software and may include unintentional dictation errors.

## 2021-12-25 NOTE — TOC Progression Note (Signed)
Transition of Care [facility]) - Progression Note    Patient Details  Name: [name] MRN: [id number] Date of Birth: Dec 06, 1950  [facility] CM/SW Contact  Beverly Sessions, RN Phone Number: 12/25/2021, 11:56 AM  Clinical Narrative:     Per podiatry remain inpatient on antibiotics.  Await culture results and recommend infectious disease consultation.  Patient will require wound care at discharge. Confirmed with Merleen Nicely at [facility] that they have nursing availability if patient needs wound care and/or IV antibiotics at discharge   Expected Discharge Plan: [place] Barriers to Discharge: Continued Medical Work up  Expected Discharge Plan and Services Expected Discharge Plan: [name] arrangements for the past 2 months: [place] Determinants of Health (SDOH) Interventions    Readmission Risk Interventions    03/26/2021    4:21 PM 01/03/2020    3:37 PM 04/22/2019   10:50 AM  Readmission Risk Prevention Plan  Transportation Screening Complete Complete Complete  PCP or Specialist Appt within 3-5 Days Complete    Social Work Consult for [place] Planning/Counseling Complete    Palliative Care Screening Complete    Medication Review Press photographer) Complete Complete Referral to Pharmacy  PCP or Specialist appointment within 3-5 days of discharge  Complete Complete  HRI or Home Care Consult  Complete Complete  SW Recovery Care/Counseling Consult  Complete Complete  Palliative Care Screening  Not Applicable Not [place]   Complete

## 2021-12-25 NOTE — Evaluation (Signed)
Occupational Therapy Evaluation Patient Details Name: [name] MRN: [id number] DOB: 1950-12-12 Today's Date: 12/25/2021   History of Present Illness 71 y.o. female PMHx morbid obesity, HTN, neuropathy, not diabetic admitted for worsening infection left heel ulcer that has been present for the past 1-2 years.  Patient states that recently she noticed it getting black and presented to the ED and was ultimately admitted for worsening infection left heel ulcer.   Clinical Impression   Patient presenting with decreased Ind in self care, balance, functional mobility/transfers, endurance, and safety awareness. Patient reports living at home with husband. She has an aide that assists with self care 2x/wk. She endorses transfer with rollator to wheelchair. No ambulation in quite some time. Patient currently functioning mod- +2 assistance. Pt stands with +2 assistance and unable to maintain NWB status. Pt is unable to laterally scoot without heavy assistance. Patient will benefit from acute OT to increase overall independence in the areas of ADLs, functional mobility, and safety awareness in order to safely discharge to next venue of care.      Recommendations for follow up therapy are one component of a multi-disciplinary discharge planning process, led by the attending physician.  Recommendations may be updated based on patient status, additional functional criteria and insurance authorization.   Follow Up Recommendations  Skilled nursing-short term rehab (<3 hours/day)    Assistance Recommended at Discharge Intermittent Supervision/Assistance  Patient can return home with the following A lot of help with walking and/or transfers;A lot of help with bathing/dressing/bathroom;Assistance with cooking/housework;Help with stairs or ramp for entrance;Assist for transportation    Functional Status Assessment  Patient has had a recent decline in their functional status and demonstrates the ability to make  significant improvements in function in a reasonable and predictable amount of time.  Equipment Recommendations  Other (comment) (defer to next venue of care)       Precautions / Restrictions Precautions Precautions: Fall Restrictions Weight Bearing Restrictions: Yes LLE Weight Bearing: Non weight bearing      Mobility Bed Mobility Overal bed mobility: Needs Assistance Bed Mobility: Supine to Sit, Sit to Supine     Supine to sit: Max assist Sit to supine: Max assist, +2 for physical assistance        Transfers Overall transfer level: Needs assistance Equipment used: 2 person hand held assist, Rolling walker (2 wheels) Transfers: Sit to/from Stand Sit to Stand: Mod assist, Max assist, +2 physical assistance, From elevated surface                  Balance Overall balance assessment: Needs assistance Sitting-balance support: Feet supported Sitting balance-Leahy Scale: Fair   Postural control: Posterior lean Standing balance support: Reliant on assistive device for balance, Bilateral upper extremity supported Standing balance-Leahy Scale: Zero                             ADL either performed or assessed with clinical judgement   ADL Overall ADL's : Needs assistance/impaired     Grooming: Wash/dry hands;Set up;Supervision/safety;Sitting                                 General ADL Comments: Pt standing with +2 assistance and unable to safely transfer. Unable to perform lateral scoots without max A.     Vision Patient Visual Report: No change from baseline  Pertinent Vitals/Pain Pain Assessment Pain Assessment: No/denies pain     Hand Dominance Right   Extremity/Trunk Assessment Upper Extremity Assessment Upper Extremity Assessment: Generalized weakness   Lower Extremity Assessment Lower Extremity Assessment: Generalized weakness       Communication Communication Communication: No difficulties    Cognition Arousal/Alertness: Awake/alert Behavior During Therapy: Flat affect Overall Cognitive Status: Within Functional Limits for tasks assessed                                                  Home Living Family/patient expects to be discharged to:: Private residence Living Arrangements: Spouse/significant other Available Help at Discharge: Family;Available 24 hours/day Type of Home: House Home Access: Stairs to enter;Ramped entrance Entrance Stairs-Number of Steps: has a ramp, then up two more steps Entrance Stairs-Rails: None Home Layout: One level     Bathroom Shower/Tub: Occupational psychologist: Standard Bathroom Accessibility: Yes How Accessible: Accessible via wheelchair;Accessible via walker Home Equipment: Wheelchair - manual;Rollator (4 wheels);Shower seat;BSC/3in1   Additional Comments: Pt has [place] aide that helps with shower 2x/wk. Husband rolls her in wheelchair in home to get to bathroom and lift chair. She sleeps in lift chair.      Prior Functioning/Environment Prior Level of Function : Needs assist             Mobility Comments: Pt stands and transfers with rollator into wheelchair. She reports she does not ambulate. ADLs Comments: She reports being mod I level for UB ADLs but needing assistance from aide/husband for LB self care. Ind with toileting.        OT Problem List: Decreased strength;Decreased activity tolerance;Decreased safety awareness;Impaired balance (sitting and/or standing);Decreased knowledge of use of DME or AE      OT Treatment/Interventions: Self-care/ADL training;Therapeutic exercise;Therapeutic activities;Energy conservation;DME and/or AE instruction;Balance training;Patient/family education    OT Goals(Current goals can be found in the care plan section) Acute Rehab OT Goals Patient Stated Goal: to return to PLOF OT Goal Formulation: With patient Time For Goal Achievement: 01/08/22 Potential to  Achieve Goals: Fair ADL Goals Pt Will Perform Lower Body Dressing: sitting/lateral leans;with mod assist;with adaptive equipment Pt Will Transfer to Toilet: squat pivot transfer;with mod assist Pt Will Perform Toileting - Clothing Manipulation and hygiene: with mod assist;sitting/lateral leans Pt/caregiver will Perform Home Exercise Program: Increased strength;Both right and left upper extremity;With theraband;With written HEP provided;Independently  OT Frequency: Min 2X/week       AM-PAC OT "6 Clicks" Daily Activity     Outcome Measure Help from another person eating meals?: None Help from another person taking care of personal grooming?: None Help from another person toileting, which includes using toliet, bedpan, or urinal?: Total Help from another person bathing (including washing, rinsing, drying)?: A Lot Help from another person to put on and taking off regular upper body clothing?: A Little Help from another person to put on and taking off regular lower body clothing?: Total 6 Click Score: 15   End of Session Equipment Utilized During Treatment: Rolling walker (2 wheels) Nurse Communication: Mobility status  Activity Tolerance: Patient tolerated treatment well Patient left: in bed;with call bell/phone within reach;with bed alarm set  OT Visit Diagnosis: Unsteadiness on feet (R26.81);Muscle weakness (generalized) (M62.81)                Time: 6962-9528 OT Time Calculation (min):  24 min Charges:  OT General Charges $OT Visit: 1 Visit OT Evaluation $OT Eval Moderate Complexity: 1 Mod OT Treatments $Therapeutic Activity: 8-22 mins  Darleen Crocker, MS, OTR/L , CBIS ascom [phone]  12/25/21, 12:33 PM

## 2021-12-25 NOTE — Progress Notes (Signed)
Subjective: 1 Day Post-Op Procedure(s) (LRB): DEBRIDEMENT WOUND-HEEL ULCER (Left) BONE BIOPSY-HEEL BONE (Left) DOS: 12/24/2021  Patient resting comfortably in bed.  Dressings are clean dry and intact.  She is offloading the heel appropriately.  She is also nonweightbearing to the surgical extremity.  Objective: Vital signs in last 24 hours: Temp:  [97.6 F (36.4 C)-98.1 F (36.7 C)] 98 F (36.7 C) (08/29 1500) Pulse Rate:  [51-87] 60 (08/29 1500) Resp:  [13-20] 20 (08/29 1500) BP: (90-129)/(43-95) 121/49 (08/29 1500) SpO2:  [94 %-98 %] 98 % (08/29 1500)   POV #1  Recent Labs    12/23/21 0617  HGB 9.3*   Recent Labs    12/23/21 0617  WBC 6.5  RBC 3.39*  HCT 29.7*  PLT 159   Recent Labs    12/23/21 0617 12/24/21 0419 12/25/21 0435  NA 138 139  --   K 4.2 3.6  --   CL 108 108  --   CO2 23 24  --   BUN 23 20  --   CREATININE 1.33* 1.27* 1.44*  GLUCOSE 94 89  --   CALCIUM 8.5* 8.6*  --    No results for input(s): "LABPT", "INR" in the last 72 hours.   Assessment/Plan: 1 Day Post-Op Procedure(s) (LRB): DEBRIDEMENT WOUND-HEEL ULCER (Left) BONE BIOPSY-HEEL BONE (Left) DOS: 12/24/2021  -Bone biopsy culture currently has no growth.  Pending. -Continue ABX as per infectious disease.  As always greatly appreciated -Dressings changed.  Nonadherent Xeroform with DSD -Continue nonweightbearing surgical extremity -Recommended to the patient follow-up at the [facility] wound care center.  Patient has an established history with the [facility] wound center.  She would benefit from weekly wound care management and likely negative pressure wound VAC therapy -Podiatry will follow while inpatient  Edrick Kins 12/25/2021, 5:04 PM

## 2021-12-25 NOTE — Progress Notes (Signed)
Progress Note   Patient: [name] KZL:[id number] DOB: 03-04-1951 DOA: 12/22/2021     2 DOS: the patient was seen and examined on 12/25/2021   Brief hospital course: [name] is a 71 year old female with depression, anxiety, neuropathy, hypertension, insomnia, morbid obesity, who presents emergency department for chief concerns of nonhealing left heel wound.  Per patient the wound has been ongoing for the last 1 to 2 years.  Initial vitals in the emergency department showed temperature 98.4, respiration rate of 18, heart rate of 58, blood pressure 113/50, SPO2 of 94% on room air.  Serum sodium is 139, potassium 4.2, chloride 107, bicarb 25, BUN of 27, serum creatinine 1.57, nonfasting blood glucose 109, GFR of 35, WBC 6.7, hemoglobin 9.7, platelets of 165.  Lactic acid was 1.0.  ED treatment: Cefepime 2 g IV, vancomycin.  8/27; patient remained afebrile, no leukocytosis.  1/4 blood culture bottles with staph epi and Streptococcus species no further characterization.  Most likely a contaminant but patient do have an open wound.  Currently on cefepime and vancomycin. Left foot x-ray with no osseous abnormalities. Ordered left foot MRI to rule out osteomyelitis Podiatry to take her to the OR tomorrow for debridement and possible bone biopsy.  8/28: Patient remained stable.  Renal function seems improving, creatinine at 1.27 today.  Blood cultures remain negative. MRI left heel with some concern of early osteomyelitis although it was motion degraded picture.  Going to the OR for debridement and bone biopsy later today with podiatry.  8/29: Patient remained stable.  Underwent debridement and bone biopsy of left heel.  Preliminary cultures negative. PT/OT evaluation ordered. ID will see the patient today to determine the need and type of antibiotic.  Patient does not want to go to rehab if that will be the recommendations and would like to be back home with home health services. She  will be nonweightbearing on left lower extremity per podiatry   Assessment and Plan: * Non healing left heel wound S/p wound debridement and bone biopsy.  Preliminary cultures negative. MRI with concern of early osteomyelitis. - ABI of the lower extremities were normal -Continue with antibiotics -ID to determine the need and type of antibiotics today -PT/OT evaluation  Cellulitis of left lower leg - Continue with vancomycin and cefepime  Essential hypertension - Resumed home amlodipine 5 mg daily, atorvastatin 10 mg nightly, hydralazine 50 mg p.o. 3 times daily - Labetalol 5 mg IV every 3 hours as needed for SBP greater than 180, 4 doses ordered  Atonic neurogenic bladder - Chronic indwelling Foley catheter in place, per patient last replacement was 1 week ago - Patient has had chronic indwelling Foley catheter for approximately 8 to 10 years  Chronic kidney disease (CKD), stage III (moderate) (HCC) Creatinine with some improvement to 1.33 with IV fluid.  Baseline appears to be between 1.3-1.5. Consistent with CKD stage IIIa -Seems stable -Monitor renal function -Avoid nephrotoxins  Major depressive disorder, recurrent, in partial remission (HCC) - Resumed home bupropion 300 mg daily, duloxetine 60 mg every morning, trazodone 50 mg nightly, Trintellix 5 mg daily resumed - Quetiapine 150 mg nightly resume  Obesity, Class III, BMI 40-49.9 (morbid obesity) ([place]) Estimated body mass index is 47.3 kg/m as calculated from the following:   Height as of this encounter: '5\' 4"'$  (1.626 m).   Weight as of this encounter: 125 kg.  - This complicates overall care and prognosis.   COPD (chronic obstructive pulmonary disease) ([place]) -  Not in acute exacerbation - Resumed home maintenance inhaler twice daily - Albuterol nebulizer as needed as needed for shortness of breath and wheezing  Hyperlipidemia - Atorvastatin 10 mg nightly resumed    Subjective: Patient was complaining of left  heel pain.  She does not want to go to a rehab facility if those were the recommendations, she will like to go home with home health services.  Physical Exam: Vitals:   12/24/21 2137 12/25/21 0407 12/25/21 0811 12/25/21 1500  BP: 105/73 (!) 90/57 (!) 107/43 (!) 121/49  Pulse: (!) 52 87 (!) 52 60  Resp: '16 18 18 20  '$ Temp: 98.1 F (36.7 C) 98 F (36.7 C) 97.8 F (36.6 C) 98 F (36.7 C)  TempSrc: Oral Oral  Oral  SpO2: 96% 97% 96% 98%  Weight:      Height:       General.  Morbidly obese lady, in no acute distress. Pulmonary.  Lungs clear bilaterally, normal respiratory effort. CV.  Regular rate and rhythm, no JVD, rub or murmur. Abdomen.  Soft, nontender, nondistended, BS positive. CNS.  Alert and oriented .  No focal neurologic deficit. Extremities.  No edema, no cyanosis, pulses intact and symmetrical.  Left foot with Ace wrap Psychiatry.  Judgment and insight appears normal.  Data Reviewed: Prior data reviewed  Family Communication: Discussed with patient.  Disposition: Status is: Inpatient Remains inpatient appropriate because: Severity of illness   Planned Discharge Destination: Home with Home Health  DVT prophylaxis.  Subcu heparin Time spent: 43 minutes  This record has been created using Systems analyst. Errors have been sought and corrected,but may not always be located. Such creation errors do not reflect on the standard of care.  Author: Lorella Nimrod, MD 12/25/2021 3:18 PM  For on call review [email or web address].

## 2021-12-25 NOTE — Evaluation (Signed)
Physical Therapy Evaluation Patient Details Name: [name] MRN: [id number] DOB: 03/14/1951 Today's Date: 12/25/2021  History of Present Illness  71 y.o. female PMHx morbid obesity, HTN, neuropathy, not diabetic admitted for worsening infection left heel ulcer that has been present for the past 1-2 years.  Patient states that recently she noticed it getting black and presented to the ED and was ultimately admitted for worsening infection left heel ulcer.  Clinical Impression  Pt is a pleasant 71 year old female who was admitted for L heel wound and is s/p debridement on 12/24/21. Pt performs bed mobility with max assist +2 and unable to further attempt transfers. Initially pt NWB, however as pt unable to comply, MD stated she could be PWB with boot. Unable to stand, however able to sit at EOB for a short period of time. Pt demonstrates deficits with strength/mobility/endurance. Would benefit from skilled PT to address above deficits and promote optimal return to PLOF; recommend transition to STR upon discharge from acute hospitalization. Pt very concerned about her ability to care for herself at home, however doesn't want to consider facility admission.       Recommendations for follow up therapy are one component of a multi-disciplinary discharge planning process, led by the attending physician.  Recommendations may be updated based on patient status, additional functional criteria and insurance authorization.  Follow Up Recommendations Skilled nursing-short term rehab (<3 hours/day) (however refusing) Can patient physically be transported by private vehicle: No    Assistance Recommended at Discharge Frequent or constant Supervision/Assistance  Patient can return home with the following  Two people to help with walking and/or transfers;Two people to help with bathing/dressing/bathroom;Help with stairs or ramp for entrance    Equipment Recommendations  (TBD)  Recommendations for Other  Services       Functional Status Assessment Patient has had a recent decline in their functional status and demonstrates the ability to make significant improvements in function in a reasonable and predictable amount of time.     Precautions / Restrictions Precautions Precautions: Fall Restrictions Weight Bearing Restrictions: Yes LLE Weight Bearing: Non weight bearing Other Position/Activity Restrictions: per secure chat with podiatry, can be PWB in shoe, just for transfers      Mobility  Bed Mobility Overal bed mobility: Needs Assistance Bed Mobility: Rolling, Supine to Sit, Sit to Supine Rolling: Mod assist, +2 for physical assistance   Supine to sit: Max assist Sit to supine: Max assist   General bed mobility comments: needs assist for trunkal elevation. Once seated, able to tolerate sitting at EOB ~5 min. Unable to attempt transfers at this time    Transfers                        Ambulation/Gait                  Stairs            Wheelchair Mobility    Modified Rankin (Stroke Patients Only)       Balance Overall balance assessment: Needs assistance Sitting-balance support: Feet supported Sitting balance-Leahy Scale: Fair                                       Pertinent Vitals/Pain Pain Assessment Pain Assessment: No/denies pain    Home Living Family/patient expects to be discharged to:: Private residence Living Arrangements: Spouse/significant other Available  Help at Discharge: Family;Available 24 hours/day Type of Home: House Home Access: Stairs to enter;Ramped entrance Entrance Stairs-Rails: None Entrance Stairs-Number of Steps: has a ramp, then up two more steps   Home Layout: One level Home Equipment: IT sales professional (4 wheels);Shower seat;BSC/3in1 Additional Comments: Pt has [place] aide that helps with shower 2x/wk. Husband rolls her in wheelchair in home to get to bathroom and lift chair. She  sleeps in lift chair.    Prior Function Prior Level of Function : Needs assist             Mobility Comments: Pt stands and transfers with rollator into wheelchair. She reports she does not ambulate. REports she has had several falls in last 2 weeks. States she depends on husband for most of care ADLs Comments: She reports being mod I level for UB ADLs but needing assistance from aide/husband for LB self care. Ind with toileting.     Hand Dominance        Extremity/Trunk Assessment   Upper Extremity Assessment Upper Extremity Assessment: Generalized weakness (B UE grossly 4/5)    Lower Extremity Assessment Lower Extremity Assessment: Generalized weakness (B LE grossly 2/5)       Communication   Communication: No difficulties  Cognition Arousal/Alertness: Awake/alert Behavior During Therapy: Flat affect Overall Cognitive Status: Within Functional Limits for tasks assessed                                 General Comments: pleasant and agreeable to session        General Comments      Exercises Other Exercises Other Exercises: Pt with large BM, needed +2 mod assist for rolling to B sides for clean up. Needs max assist for hygiene and scooting up towrads HOB. Assisted with bathing tasks and CHG wipes Other Exercises: supine ther-ex performed on B LE including AP, glut sets, QS, and hip abd/add. 10 reps with supervision   Assessment/Plan    PT Assessment Patient needs continued PT services  PT Problem List Decreased strength;Decreased activity tolerance;Decreased balance;Decreased mobility       PT Treatment Interventions DME instruction;Therapeutic exercise;Balance training    PT Goals (Current goals can be found in the Care Plan section)  Acute Rehab PT Goals Patient Stated Goal: to go home and not SNF PT Goal Formulation: With patient Time For Goal Achievement: 01/08/22 Potential to Achieve Goals: Good    Frequency Min 2X/week      Co-evaluation               AM-PAC PT "6 Clicks" Mobility  Outcome Measure Help needed turning from your back to your side while in a flat bed without using bedrails?: A Lot Help needed moving from lying on your back to sitting on the side of a flat bed without using bedrails?: A Lot Help needed moving to and from a bed to a chair (including a wheelchair)?: Total Help needed standing up from a chair using your arms (e.g., wheelchair or bedside chair)?: Total Help needed to walk in hospital room?: Total Help needed climbing 3-5 steps with a railing? : Total 6 Click Score: 8    End of Session   Activity Tolerance: Patient tolerated treatment well Patient left: in bed;with bed alarm set Nurse Communication: Mobility status PT Visit Diagnosis: Repeated falls (R29.6);Muscle weakness (generalized) (M62.81);Unsteadiness on feet (R26.81)    Time: 4270-6237 PT Time Calculation (min) (ACUTE ONLY): 48  min   Charges:   PT Evaluation $PT Eval Moderate Complexity: 1 Mod PT Treatments $Therapeutic Exercise: 8-22 mins $Therapeutic Activity: 8-22 mins        Greggory Stallion, PT, DPT, GCS [phone]   [name] 12/25/2021, 4:32 PM

## 2021-12-25 NOTE — Assessment & Plan Note (Signed)
S/p wound debridement and bone biopsy.  Preliminary cultures negative. MRI with concern of early osteomyelitis. - ABI of the lower extremities were normal -Continue with antibiotics -ID to determine the need and type of antibiotics today -PT/OT evaluation

## 2021-12-26 ENCOUNTER — Other Ambulatory Visit: Payer: Medicare HMO | Admitting: Nurse Practitioner

## 2021-12-26 ENCOUNTER — Encounter: Payer: Self-pay | Admitting: Internal Medicine

## 2021-12-26 DIAGNOSIS — L97429 Non-pressure chronic ulcer of left heel and midfoot with unspecified severity: Secondary | ICD-10-CM

## 2021-12-26 DIAGNOSIS — L03116 Cellulitis of left lower limb: Secondary | ICD-10-CM | POA: Diagnosis not present

## 2021-12-26 DIAGNOSIS — F3341 Major depressive disorder, recurrent, in partial remission: Secondary | ICD-10-CM | POA: Diagnosis not present

## 2021-12-26 DIAGNOSIS — S91302A Unspecified open wound, left foot, initial encounter: Secondary | ICD-10-CM | POA: Diagnosis not present

## 2021-12-26 LAB — CULTURE, BLOOD (ROUTINE X 2)

## 2021-12-26 LAB — SURGICAL PATHOLOGY

## 2021-12-26 MED ORDER — LISINOPRIL 20 MG PO TABS: 20.0000 mg | ORAL_TABLET | Freq: Every day | ORAL | Status: DC

## 2021-12-26 MED ORDER — AMOXICILLIN-POT CLAVULANATE 875-125 MG PO TABS: 1.0000 | ORAL_TABLET | Freq: Two times a day (BID) | ORAL | 0 refills | Status: AC

## 2021-12-26 NOTE — Discharge Summary (Signed)
Physician Discharge Summary   Patient: [name] MRN: [id number] DOB: 11-20-1950  Admit date:     12/22/2021  Discharge date: 12/26/21  Discharge Physician: [name]   PCP: [name] Ruths, MD   Recommendations at discharge:   Follow-up at the [facility] wound care clinic in 1 week. Follow-up with Dr. Delaine Lame, [place] specialist, in 2 weeks Follow-up with PCP in 1 week  Discharge Diagnoses: Principal Problem:   Non healing left heel wound Active Problems:   Cellulitis of left lower leg   Peripheral vascular disease of lower extremity with ulceration (HCC)   Essential hypertension   Atonic neurogenic bladder   Chronic kidney disease (CKD), stage III (moderate) (HCC)   Major depressive disorder, recurrent, in partial remission (HCC)   Obesity, Class III, BMI 40-49.9 (morbid obesity) ([place])   Obstructive sleep apnea   Hyperlipidemia   COPD (chronic obstructive pulmonary disease) ([place])  Resolved Problems:   Current tobacco use  Hospital Course: [name] is a 71 year old female with depression, anxiety, neuropathy, hypertension, insomnia, morbid obesity, who presented to the emergency department for chief concerns of nonhealing left heel wound. Per patient the wound has been ongoing for the last 1 to 2 years.  Initial vitals in the emergency department showed temperature 98.4, respiration rate of 18, heart rate of 58, blood pressure 113/50, SPO2 of 94% on room air.  Serum sodium is 139, potassium 4.2, chloride 107, bicarb 25, BUN of 27, serum creatinine 1.57, nonfasting blood glucose 109, GFR of 35, WBC 6.7, hemoglobin 9.7, platelets of 165.  Lactic acid was 1.0.  ED treatment: Cefepime 2 g IV, vancomycin.  8/27; patient remained afebrile, no leukocytosis.  1/4 blood culture bottles with staph epi and Streptococcus species no further characterization.  Most likely a contaminant but patient do have an open wound.  Currently on cefepime and vancomycin. Left foot  x-ray with no osseous abnormalities. Ordered left foot MRI to rule out osteomyelitis Podiatry to take her to the OR tomorrow for debridement and possible bone biopsy.  8/28: Patient remained stable.  Renal function seems improving, creatinine at 1.27 today.  Blood cultures remain negative. MRI left heel with some concern of early osteomyelitis although it was motion degraded picture.  Going to the OR for debridement and bone biopsy later today with podiatry.  8/29: Patient remained stable.  Underwent debridement and bone biopsy of left heel.  Preliminary cultures negative. PT/OT evaluation ordered. ID will see the patient today to determine the need and type of antibiotic.  Patient does not want to go to rehab if that will be the recommendations and would like to be back home with home health services. She will be nonweightbearing on left lower extremity per podiatry.  8/30: No evidence of left heel osteomyelitis on bone biopsy.  Case discussed with Dr. Steva Ready, [place] specialist, recommended minimum of 2 weeks of Augmentin.  Case discussed with Dr. Events, podiatrist recommended follow-up at the wound care clinic for wound VAC and local wound care.  Assessment and Plan: * Non healing left heel wound, cellulitis left lower leg Bone biopsy was negative for osteomyelitis.  ID recommended switching IV Unasyn to oral Augmentin at discharge.  Essential hypertension Continue antihypertensives  Atonic neurogenic bladder - Chronic indwelling Foley catheter in place, per patient last replacement was about a week prior to admission - Patient has had chronic indwelling Foley catheter for approximately 8 to 10 years  Chronic kidney disease (CKD), stage IIIb (moderate) (HCC) Creatinine  is stable.  Outpatient follow-up with PCP.  Major depressive disorder, recurrent, in partial remission (HCC) Continue antidepressant/psychotropics  Obesity, Class III, BMI 40-49.9 (morbid obesity) ([place]) Healthy  eating and weight loss recommended  COPD (chronic obstructive pulmonary disease) ([place]) 3 bronchodilators Hyperlipidemia Continue Lipitor         Consultants: Podiatrist, [place] specialist Procedures performed: Debridement of left heel wound Disposition: Home health Diet recommendation:  Discharge Diet Orders (From admission, onward)     Start     Ordered   12/26/21 0000  Diet - low sodium heart healthy        12/26/21 1449           Cardiac diet DISCHARGE MEDICATION: Allergies as of 12/26/2021   No Known Allergies      Medication List     TAKE these medications    acetaminophen 500 MG tablet Commonly known as: TYLENOL Take 1,000 mg by mouth every 8 (eight) hours as needed for moderate pain.   amLODipine 5 MG tablet Commonly known as: NORVASC Take 5 mg by mouth daily.   amoxicillin-clavulanate 875-125 MG tablet Commonly known as: AUGMENTIN Take 1 tablet by mouth 2 (two) times daily for 14 days.   atorvastatin 10 MG tablet Commonly known as: LIPITOR Take 10 mg by mouth at bedtime.   budesonide-formoterol 160-4.5 MCG/ACT inhaler Commonly known as: SYMBICORT Inhale 2 puffs into the lungs 2 (two) times daily.   buPROPion 300 MG 24 hr tablet Commonly known as: WELLBUTRIN XL Take 300 mg by mouth daily.   clonazePAM 0.5 MG tablet Commonly known as: KLONOPIN Take 1 tablet (0.5 mg total) by mouth 2 (two) times daily.   docusate sodium 100 MG capsule Commonly known as: COLACE Take 1 capsule (100 mg total) by mouth 2 (two) times daily as needed.   DULoxetine 60 MG capsule Commonly known as: CYMBALTA Take 1 capsule (60 mg total) by mouth every morning. What changed: when to take this   gabapentin 600 MG tablet Commonly known as: NEURONTIN Take 600 mg by mouth 3 (three) times daily.   hydrALAZINE 50 MG tablet Commonly known as: APRESOLINE Take 50 mg by mouth 3 (three) times daily.   lisinopril 20 MG tablet Commonly known as: ZESTRIL Take 1 tablet  (20 mg total) by mouth daily.   magnesium oxide 400 MG tablet Commonly known as: MAG-OX Take 400 mg by mouth daily.   multivitamin with minerals Tabs tablet Take 1 tablet by mouth daily.   Myrbetriq 50 MG Tb24 tablet Generic drug: mirabegron ER TAKE ONE TABLET BY MOUTH ONCE DAILY   QUEtiapine Fumarate 150 MG 24 hr tablet Commonly known as: SEROQUEL XR Take 150 mg by mouth at bedtime.   torsemide 20 MG tablet Commonly known as: DEMADEX Take 1 tablet (20 mg total) by mouth daily as needed (edema).   traZODone 50 MG tablet Commonly known as: DESYREL Take 50 mg by mouth at bedtime.   vortioxetine HBr 5 MG Tabs tablet Commonly known as: TRINTELLIX Take 5 mg by mouth daily.               Discharge Care Instructions  (From admission, onward)           Start     Ordered   12/26/21 0000  Discharge wound care:       Comments: Wound care to left heel:Nonadherent Adaptic, 4 x 4 gauze, Kerlix, Ace wrap   12/26/21 1449  Discharge Exam: Filed Weights   12/22/21 4270 12/24/21 1703  Weight: 125 kg 125 kg   GEN: NAD SKIN: Warm and dry EYES: No pallor or icterus ENT: MMM CV: RRR PULM: CTA B ABD: soft, obese, NT, +BS CNS: AAO x 3, non focal EXT: Left heel wound   Condition at discharge: good  The results of significant diagnostics from this hospitalization (including imaging, microbiology, ancillary and laboratory) are listed below for reference.   Imaging Studies: MR HEEL LEFT WO CONTRAST  Result Date: 12/24/2021 CLINICAL DATA:  Osteomyelitis, foot To rule out osteo EXAM: MR OF THE LEFT HEEL WITHOUT CONTRAST TECHNIQUE: Multiplanar, multisequence MR imaging of the left hip was performed. No intravenous contrast was administered. COMPARISON:  Left foot radiograph 12/22/2021 FINDINGS: Suboptimal exam due to motion artifact. Bones/Joint/Cartilage There is minimal marrow edema and subcortical low T1 signal within the posterior lateral calcaneus (series  9, image 11, series 4 image 14). Ligaments Poorly evaluated due to motion artifact. Muscles and Tendons Mild insertional tendinosis of the Achilles tendon. Mild reactive tenosynovitis of the posterior tibial tendon. Soft tissues Diffuse ankle and foot soft tissue swelling. More focal confluent soft tissue swelling along the dorsal midfoot, partially visualized (series 4, images 18-20). There is an ulcer along the posterolateral heel. IMPRESSION: Suboptimal exam due to motion artifact. Soft tissue ulcer along the posterolateral heel with mild underlying subcortical marrow signal abnormality in the adjacent calcaneus, favored to represent early osteomyelitis. Extensive soft tissue swelling of the foot ankle as can be seen in cellulitis or lymphedema, with more confluent edema/fluid collecting along the partially visualized dorsal midfoot. Electronically Signed   By: Maurine Simmering M.D.   On: 12/24/2021 08:23   US ARTERIAL ABI (SCREENING LOWER EXTREMITY)  Result Date: 12/22/2021 CLINICAL DATA:  Nonhealing left heel wound, smoker, hypertension EXAM: NONINVASIVE PHYSIOLOGIC VASCULAR STUDY OF BILATERAL LOWER EXTREMITIES TECHNIQUE: Evaluation of both lower extremities were performed at rest, including calculation of ankle-brachial indices with single level Doppler, pressure and pulse volume recording. COMPARISON:  None Available. FINDINGS: Right ABI:  1.17 Left ABI:  1.04 Right Lower Extremity:  Biphasic arterial waveforms at the ankle. Left Lower Extremity:  Biphasic arterial waveforms at the ankle. 1.0-1.4 Normal IMPRESSION: Normal resting ABIs. Electronically Signed   By: Jerilynn Mages.  Shick M.D.   On: 12/22/2021 18:46   DG Foot Complete Left  Result Date: 12/22/2021 CLINICAL DATA:  Left heel wound for 2 years, now black. EXAM: LEFT FOOT - COMPLETE 3+ VIEW COMPARISON:  03/23/2021 ankle radiograph FINDINGS: Posterior heel ulcer. No evidence of osteomyelitis. No opaque foreign body. Generalized osteopenia.  No acute fracture  or erosion. IMPRESSION: Heel ulcer without acute osseous finding or opaque foreign body. Electronically Signed   By: Jorje Guild M.D.   On: 12/22/2021 10:43    Microbiology: Results for orders placed or performed during the hospital encounter of 12/22/21  Culture, blood (routine x 2)     Status: Abnormal   Collection Time: 12/22/21 12:47 PM   Specimen: BLOOD  Result Value Ref Range Status   Specimen Description   Final    BLOOD RIGHT ANTECUBITAL Performed at [facility], [place] [address] [place], [place] [address]    Special Requests   Final    BOTTLES DRAWN AEROBIC AND ANAEROBIC Blood Culture results may not be optimal due to an excessive volume of blood received in culture bottles Performed at [facility], [address] [place], [place] [address]    Culture  Setup Time  Final    GRAM POSITIVE COCCI IN BOTH AEROBIC AND ANAEROBIC BOTTLES CRITICAL RESULT CALLED TO, READ BACK BY AND VERIFIED WITH: ALEX CHAPPEL 12/23/21 AT 0730 BY SB    Culture (A)  Final    STAPHYLOCOCCUS EPIDERMIDIS STREPTOCOCCUS MITIS/ORALIS THE SIGNIFICANCE OF ISOLATING THIS ORGANISM FROM A SINGLE SET OF BLOOD CULTURES WHEN MULTIPLE SETS ARE DRAWN IS UNCERTAIN. PLEASE NOTIFY THE MICROBIOLOGY DEPARTMENT WITHIN ONE WEEK IF SPECIATION AND SENSITIVITIES ARE REQUIRED. Performed at [facility], [place] [address], [place], [place] [address]    Report Status 12/26/2021 FINAL  Final  Blood Culture ID Panel (Reflexed)     Status: Abnormal   Collection Time: 12/22/21 12:47 PM  Result Value Ref Range Status   Enterococcus faecalis NOT DETECTED NOT DETECTED Final   Enterococcus Faecium NOT DETECTED NOT DETECTED Final   Listeria monocytogenes NOT DETECTED NOT DETECTED Final   Staphylococcus species DETECTED (A) NOT DETECTED Final    Comment: CRITICAL RESULT CALLED TO, READ BACK BY AND VERIFIED WITH: ALEX CHAPPEL 12/23/21 @ 0730 BY SB    Staphylococcus aureus (BCID) NOT DETECTED NOT DETECTED Final    Staphylococcus epidermidis DETECTED (A) NOT DETECTED Final    Comment: Methicillin (oxacillin) resistant coagulase negative staphylococcus. Possible blood culture contaminant (unless isolated from more than one blood culture draw or clinical case suggests pathogenicity). No antibiotic treatment is indicated for blood  culture contaminants. CRITICAL RESULT CALLED TO, READ BACK BY AND VERIFIED WITH: ALEX CHAPPEL 12/23/21 @ 0730 BY SB    Staphylococcus lugdunensis NOT DETECTED NOT DETECTED Final   Streptococcus species DETECTED (A) NOT DETECTED Final    Comment: Not Enterococcus species, Streptococcus agalactiae, Streptococcus pyogenes, or Streptococcus pneumoniae. CRITICAL RESULT CALLED TO, READ BACK BY AND VERIFIED WITH: ALEX CHAPPEL 12/23/21 @ 0730 BY SB    Streptococcus agalactiae NOT DETECTED NOT DETECTED Final   Streptococcus pneumoniae NOT DETECTED NOT DETECTED Final   Streptococcus pyogenes NOT DETECTED NOT DETECTED Final   A.calcoaceticus-baumannii NOT DETECTED NOT DETECTED Final   Bacteroides fragilis NOT DETECTED NOT DETECTED Final   Enterobacterales NOT DETECTED NOT DETECTED Final   Enterobacter cloacae complex NOT DETECTED NOT DETECTED Final   Escherichia coli NOT DETECTED NOT DETECTED Final   Klebsiella aerogenes NOT DETECTED NOT DETECTED Final   Klebsiella oxytoca NOT DETECTED NOT DETECTED Final   Klebsiella pneumoniae NOT DETECTED NOT DETECTED Final   Proteus species NOT DETECTED NOT DETECTED Final   Salmonella species NOT DETECTED NOT DETECTED Final   Serratia marcescens NOT DETECTED NOT DETECTED Final   Haemophilus influenzae NOT DETECTED NOT DETECTED Final   Neisseria meningitidis NOT DETECTED NOT DETECTED Final   Pseudomonas aeruginosa NOT DETECTED NOT DETECTED Final   Stenotrophomonas maltophilia NOT DETECTED NOT DETECTED Final   Candida albicans NOT DETECTED NOT DETECTED Final   Candida auris NOT DETECTED NOT DETECTED Final   Candida glabrata NOT DETECTED NOT  DETECTED Final   Candida krusei NOT DETECTED NOT DETECTED Final   Candida parapsilosis NOT DETECTED NOT DETECTED Final   Candida tropicalis NOT DETECTED NOT DETECTED Final   Cryptococcus neoformans/gattii NOT DETECTED NOT DETECTED Final   Methicillin resistance mecA/C DETECTED (A) NOT DETECTED Final    Comment: CRITICAL RESULT CALLED TO, READ BACK BY AND VERIFIED WITH: ALEX CHAPPEL 12/23/21 @ 0730 BY SB Performed at [facility], [address] [place], [place] [address]   Culture, blood (routine x 2)     Status: None (Preliminary result)   Collection Time: 12/22/21 12:48 PM  Specimen: BLOOD  Result Value Ref Range Status   Specimen Description BLOOD LEFT AC  Final   Special Requests   Final    BOTTLES DRAWN AEROBIC AND ANAEROBIC Blood Culture adequate volume   Culture   Final    NO GROWTH 4 DAYS Performed at [facility], [address], [place], [place] [address]    Report Status PENDING  Incomplete  Culture, blood (Routine X 2) w Reflex to ID Panel     Status: None (Preliminary result)   Collection Time: 12/23/21  8:28 AM   Specimen: BLOOD RIGHT HAND  Result Value Ref Range Status   Specimen Description BLOOD RIGHT HAND  Final   Special Requests   Final    BOTTLES DRAWN AEROBIC AND ANAEROBIC Blood Culture adequate volume   Culture   Final    NO GROWTH 3 DAYS Performed at [facility], [address], [place], [place] [address]    Report Status PENDING  Incomplete  Culture, blood (Routine X 2) w Reflex to ID Panel     Status: None (Preliminary result)   Collection Time: 12/23/21  8:34 AM   Specimen: BLOOD  Result Value Ref Range Status   Specimen Description BLOOD LEFT ANTECUBITAL  Final   Special Requests   Final    BOTTLES DRAWN AEROBIC AND ANAEROBIC Blood Culture adequate volume   Culture   Final    NO GROWTH 3 DAYS Performed at [facility], [address], [place], [place] [address]    Report Status PENDING  Incomplete   Aerobic/Anaerobic Culture w Gram Stain (surgical/deep wound)     Status: None (Preliminary result)   Collection Time: 12/24/21  6:32 PM   Specimen: PATH Other; Tissue  Result Value Ref Range Status   Specimen Description   Final    BONE Performed at [facility], [address] [place], [place] [address]    Special Requests   Final    NONE Performed at [facility], [address] [place], [place] [address]    Gram Stain NO WBC SEEN NO ORGANISMS SEEN   Final   Culture   Final    NO GROWTH 2 DAYS NO ANAEROBES ISOLATED; CULTURE IN PROGRESS FOR 5 DAYS Performed at [facility], [place] [address], [place],  [address]    Report Status PENDING  Incomplete    Labs: CBC: Recent Labs  Lab 12/22/21 0838 12/23/21 0617  WBC 6.7 6.5  NEUTROABS 5.1  --   HGB 9.7* 9.3*  HCT 31.2* 29.7*  MCV 87.6 87.6  PLT 165 240   Basic Metabolic Panel: Recent Labs  Lab 12/22/21 0838 12/23/21 0617 12/24/21 0419 12/25/21 0435  NA 139 138 139  --   K 4.2 4.2 3.6  --   CL 107 108 108  --   CO2 '25 23 24  '$ --   GLUCOSE 109* 94 89  --   BUN 27* 23 20  --   CREATININE 1.57* 1.33* 1.27* 1.44*  CALCIUM 8.8* 8.5* 8.6*  --    Liver Function Tests: Recent Labs  Lab 12/22/21 0838  AST 15  ALT 12  ALKPHOS 62  BILITOT 0.5  PROT 6.1*  ALBUMIN 3.4*   CBG: No results for input(s): "GLUCAP" in the last 168 hours.  Discharge time spent: greater than 30 minutes.  Signed: Jennye Boroughs, MD [facility] 12/26/2021

## 2021-12-26 NOTE — Progress Notes (Signed)
Discharge instructions were reviewed with pt. Left foot dressing was changed and questions were answered. Pt will leaved by EMS . IV was taken out. VSS.

## 2021-12-26 NOTE — Plan of Care (Signed)

## 2021-12-26 NOTE — TOC Transition Note (Signed)
Transition of Care [facility]) - CM/SW Discharge Note   Patient Details  Name: [name] MRN: [id number] Date of Birth: 1951-02-20  Transition of Care [facility]) CM/SW Contact:  Beverly Sessions, RN Phone Number: 12/26/2021, 3:19 PM   Clinical Narrative:    Patient to discharge today EMS transport called Patient confirms that husband will be at home to accept her from [place] with [facility] notified of discharge     Barriers to Discharge: Continued Medical Work up   Patient Goals and CMS Choice Patient states their goals for this hospitalization and ongoing recovery are:: home with [facility] CMS Medicare.gov Compare Post Acute Care list provided to:: Patient Choice offered to / list presented to : Patient  Discharge Placement                       Discharge Plan and Services                                     Social Determinants of Health (SDOH) Interventions     Readmission Risk Interventions    03/26/2021    4:21 PM 01/03/2020    3:37 PM 04/22/2019   10:50 AM  Readmission Risk Prevention Plan  Transportation Screening Complete Complete Complete  PCP or Specialist Appt within 3-5 Days Complete    Social Work Consult for [place] Planning/Counseling Complete    Palliative Care Screening Complete    Medication Review Press photographer) Complete Complete Referral to Pharmacy  PCP or Specialist appointment within 3-5 days of discharge  Complete Complete  HRI or Home Care Consult  Complete Complete  SW Recovery Care/Counseling Consult  Complete Complete  Palliative Care Screening  Not Applicable Not [place]   Complete

## 2021-12-26 NOTE — TOC Progression Note (Signed)
Transition of Care [facility]) - Progression Note    Patient Details  Name: [name] MRN: [id number] Date of Birth: 11-May-1950  Transition of Care [facility]) CM/SW Contact  Beverly Sessions, RN Phone Number: 12/26/2021, 1:09 PM  Clinical Narrative:     Therapy recommending SNF Patient adamantly declines and will return home wit home health services through [facility] at discharge Awaiting determination if patient will require IV antibiotics at discharge  Patient will require EMS transport at discharge     Expected Discharge Plan: [place] Barriers to Discharge: Continued Medical Work up  Expected Discharge Plan and Services Expected Discharge Plan: [place] arrangements for the past 2 months: [place] Determinants of Health (SDOH) Interventions    Readmission Risk Interventions    03/26/2021    4:21 PM 01/03/2020    3:37 PM 04/22/2019   10:50 AM  Readmission Risk Prevention Plan  Transportation Screening Complete Complete Complete  PCP or Specialist Appt within 3-5 Days Complete    Social Work Consult for [place] Planning/Counseling Complete    Palliative Care Screening Complete    Medication Review Press photographer) Complete Complete Referral to Pharmacy  PCP or Specialist appointment within 3-5 days of discharge  Complete Complete  HRI or Home Care Consult  Complete Complete  SW Recovery Care/Counseling Consult  Complete Complete  Palliative Care Screening  Not Applicable Not [place]   Complete

## 2021-12-26 NOTE — Progress Notes (Signed)
[facility]) Hospital Liaison note:  This is a pending outpatient-based Palliative Care patient. Will continue to follow for disposition.  Please call with any outpatient palliative questions or concerns.  Thank you, Lorelee Market, LPN [facility] Liaison [phone]

## 2021-12-26 NOTE — Care Management Important Message (Signed)
Important Message  Patient Details  Name: [name] MRN: [id number] Date of Birth: 11-29-1950   Medicare Important Message Given:  Yes  Reviewed Medicare IM with patient via room phone due to isolation status.  Copy of Medicare IM placed in mail to home address on file.    Dannette Barbara 12/26/2021, 10:55 AM

## 2021-12-26 NOTE — Progress Notes (Signed)
   Date of Admission:  12/22/2021     ID: [name] is a 71 y.o. female  Principal Problem:   Non healing left heel wound Active Problems:   Major depressive disorder, recurrent, in partial remission ([place])   Essential hypertension   Atonic neurogenic bladder   Peripheral vascular disease of lower extremity with ulceration (HCC)   Obesity, Class III, BMI 40-49.9 (morbid obesity) (HCC)   Obstructive sleep apnea   Cellulitis of left lower leg   Chronic kidney disease (CKD), stage III (moderate) (HCC)   Hyperlipidemia   COPD (chronic obstructive pulmonary disease) (HCC)    Subjective: Says she is feeling better  Medications:   amLODipine  5 mg Oral Daily   atorvastatin  10 mg Oral QHS   buPROPion  300 mg Oral Daily   Chlorhexidine Gluconate Cloth  6 each Topical Daily   clonazePAM  0.5 mg Oral BID   DULoxetine  60 mg Oral BH-q7a   gabapentin  600 mg Oral TID   heparin  5,000 Units Subcutaneous Q8H   hydrALAZINE  50 mg Oral TID   mometasone-formoterol  2 puff Inhalation BID   multivitamin with minerals  1 tablet Oral Daily   QUEtiapine Fumarate  150 mg Oral QHS   traZODone  50 mg Oral QHS   vortioxetine HBr  5 mg Oral Daily    Objective: Vital signs in last 24 hours: Temp:  [97.7 F (36.5 C)-98.2 F (36.8 C)] 97.7 F (36.5 C) (08/30 0803) Pulse Rate:  [54-61] 57 (08/30 0803) Resp:  [16-20] 16 (08/30 0803) BP: (110-126)/(42-53) 126/42 (08/30 0803) SpO2:  [95 %-98 %] 97 % (08/30 0803)    PHYSICAL EXAM:  General: Alert, cooperative, no distress, appears stated age.  Lungs: Clear to auscultation bilaterally. No Wheezing or Rhonchi. No rales. Heart: Regular rate and rhythm, no murmur, rub or gallop. Abdomen: Soft, non-tender,not distended. Bowel sounds normal. No masses Extremities:left heel wound Covered with dressing  Neurologic: did not asses sin detail foley  Lab Results Recent Labs    12/24/21 0419 12/25/21 0435  NA 139  --   K 3.6  --   CL 108  --    CO2 24  --   BUN 20  --   CREATININE 1.27* 1.44*   Microbiology:bone culture NG o far Pathology bon no osteo  Studies/Results: No results found.   Assessment/Plan: ?Left heel decubitus  . Underwent debridement Bone culture/biopsy no osteo On unasyn Change to augmentin for 2 weeks May need more- will follow her as OP   Strep and staph epi in the blood culture- likely contaminant. Repeat neg   Multiple sclerosis followed by neurology but has been non compliant   Neurogenic bladder- has foley   Rt side of nose- lesion- likely BCC- need to see dermatology  Discussed the management with patient and care team

## 2021-12-26 NOTE — Progress Notes (Signed)
Physical Therapy Treatment Patient Details Name: [name] MRN: [id number] DOB: 09/12/1950 Today's Date: 12/26/2021   History of Present Illness 71 y.o. female PMHx morbid obesity, HTN, neuropathy, not diabetic admitted for worsening infection left heel ulcer that has been present for the past 1-2 years.  Patient states that recently she noticed it getting black and presented to the ED and was ultimately admitted for worsening infection left heel ulcer.    PT Comments    Pt is making gradual progress towards goals with ability to transfer via step pivot to recliner using RW and mod assist. Pt with post op shoe donned and educated on [facility] via secure chat from MD. Pt agreeable to sit in recliner and heels off loaded. Able to perform there-ex and continued education regarding mobility. Will continue to progress as able.   Recommendations for follow up therapy are one component of a multi-disciplinary discharge planning process, led by the attending physician.  Recommendations may be updated based on patient status, additional functional criteria and insurance authorization.  Follow Up Recommendations  Skilled nursing-short term rehab (<3 hours/day) (however currently refusing) Can patient physically be transported by private vehicle: No   Assistance Recommended at Discharge Frequent or constant Supervision/Assistance  Patient can return home with the following Two people to help with walking and/or transfers;Two people to help with bathing/dressing/bathroom;Help with stairs or ramp for entrance   Equipment Recommendations   (TBD)    Recommendations for Other Services       Precautions / Restrictions Precautions Precautions: Fall Restrictions Weight Bearing Restrictions: Yes LLE Weight Bearing: Partial weight bearing Other Position/Activity Restrictions: per secure chat with podiatry, can be PWB in shoe, just for transfers     Mobility  Bed Mobility Overal bed mobility: Needs  Assistance Bed Mobility: Supine to Sit     Supine to sit: Mod assist     General bed mobility comments: able to initiate movement, however needs significant assist for trunkal elevation. Once seated at EOB, needs min/mod assist to scoot towards EOB    Transfers Overall transfer level: Needs assistance Equipment used: Rolling walker (2 wheels) Transfers: Sit to/from Stand Sit to Stand: Mod assist, Max assist, From elevated surface           General transfer comment: able to SPT to recliner with mod/max assist of 1 and use of RW. Post op shoe donned with ~ 50% Wbing per subjective info from patient    Ambulation/Gait               General Gait Details: unsafe   Stairs             Wheelchair Mobility    Modified Rankin (Stroke Patients Only)       Balance Overall balance assessment: Needs assistance Sitting-balance support: Feet supported Sitting balance-Leahy Scale: Fair     Standing balance support: Reliant on assistive device for balance, Bilateral upper extremity supported Standing balance-Leahy Scale: Poor Standing balance comment: post bias noted with bracing against bed                            Cognition Arousal/Alertness: Awake/alert Behavior During Therapy: Flat affect Overall Cognitive Status: Within Functional Limits for tasks assessed                                 General Comments: pleasant and agreeable to mobility  Exercises Other Exercises Other Exercises: supine ther-ex on B LE including AP, glut sets, quad sets, hip abd/add, and SLRs. 10 reps with cues    General Comments        Pertinent Vitals/Pain Pain Assessment Pain Assessment: 0-10 Pain Score: 5  Pain Location: L heel Pain Descriptors / Indicators: Aching, Discomfort, Dull Pain Intervention(s): Limited activity within patient's tolerance    Home Living                          Prior Function            PT  Goals (current goals can now be found in the care plan section) Acute Rehab PT Goals Patient Stated Goal: to go home and not SNF PT Goal Formulation: With patient Time For Goal Achievement: 01/08/22 Potential to Achieve Goals: Good Progress towards PT goals: Progressing toward goals    Frequency    Min 2X/week      PT Plan Current plan remains appropriate    Co-evaluation              AM-PAC PT "6 Clicks" Mobility   Outcome Measure  Help needed turning from your back to your side while in a flat bed without using bedrails?: A Lot Help needed moving from lying on your back to sitting on the side of a flat bed without using bedrails?: A Lot Help needed moving to and from a bed to a chair (including a wheelchair)?: A Lot Help needed standing up from a chair using your arms (e.g., wheelchair or bedside chair)?: A Lot Help needed to walk in hospital room?: Total Help needed climbing 3-5 steps with a railing? : Total 6 Click Score: 10    End of Session Equipment Utilized During Treatment: Gait belt Activity Tolerance: Patient tolerated treatment well Patient left: in chair Nurse Communication: Mobility status PT Visit Diagnosis: Repeated falls (R29.6);Muscle weakness (generalized) (M62.81);Unsteadiness on feet (R26.81)     Time: 8502-7741 PT Time Calculation (min) (ACUTE ONLY): 30 min  Charges:  $Therapeutic Exercise: 8-22 mins $Therapeutic Activity: 8-22 mins                     Greggory Stallion, PT, DPT, GCS [phone]    Shley Dolby 12/26/2021, 3:11 PM

## 2021-12-27 ENCOUNTER — Telehealth: Payer: Self-pay | Admitting: Nurse Practitioner

## 2021-12-27 LAB — CULTURE, BLOOD (ROUTINE X 2)
Culture: NO GROWTH
Special Requests: ADEQUATE

## 2021-12-27 NOTE — Telephone Encounter (Signed)
Attempted to contact patient to reschedule the Palliative Consult, (patient was in the hospital at the time of the previous appointment), no answer - left message requesting a return call to reschedule visit.

## 2021-12-28 LAB — CULTURE, BLOOD (ROUTINE X 2)
Culture: NO GROWTH
Culture: NO GROWTH
Special Requests: ADEQUATE
Special Requests: ADEQUATE

## 2022-01-01 ENCOUNTER — Ambulatory Visit: Payer: Medicare HMO | Attending: Infectious Diseases | Admitting: Infectious Diseases

## 2022-01-01 ENCOUNTER — Telehealth: Payer: Self-pay | Admitting: *Deleted

## 2022-01-01 DIAGNOSIS — N183 Chronic kidney disease, stage 3 unspecified: Secondary | ICD-10-CM | POA: Diagnosis not present

## 2022-01-01 DIAGNOSIS — E785 Hyperlipidemia, unspecified: Secondary | ICD-10-CM | POA: Diagnosis not present

## 2022-01-01 DIAGNOSIS — L89623 Pressure ulcer of left heel, stage 3: Secondary | ICD-10-CM | POA: Diagnosis not present

## 2022-01-01 DIAGNOSIS — F419 Anxiety disorder, unspecified: Secondary | ICD-10-CM | POA: Diagnosis not present

## 2022-01-01 DIAGNOSIS — F3341 Major depressive disorder, recurrent, in partial remission: Secondary | ICD-10-CM | POA: Diagnosis not present

## 2022-01-01 DIAGNOSIS — G4733 Obstructive sleep apnea (adult) (pediatric): Secondary | ICD-10-CM | POA: Diagnosis not present

## 2022-01-01 DIAGNOSIS — I129 Hypertensive chronic kidney disease with stage 1 through stage 4 chronic kidney disease, or unspecified chronic kidney disease: Secondary | ICD-10-CM | POA: Diagnosis not present

## 2022-01-01 DIAGNOSIS — I70248 Atherosclerosis of native arteries of left leg with ulceration of other part of lower left leg: Secondary | ICD-10-CM | POA: Diagnosis not present

## 2022-01-01 DIAGNOSIS — J449 Chronic obstructive pulmonary disease, unspecified: Secondary | ICD-10-CM | POA: Diagnosis not present

## 2022-01-01 LAB — AEROBIC/ANAEROBIC CULTURE W GRAM STAIN (SURGICAL/DEEP WOUND): Gram Stain: NONE SEEN

## 2022-01-01 NOTE — Telephone Encounter (Signed)
Precious w/ [facility] is calling for wound care orders for patient. Patient is being released to to their facility, requesting frequency of dressing changes, how to clean wound.Please advise.  Returned call and spoke with Santiago Glad to inform that patient that was not seen in our office but was seen in a hospital visit,no upcoming appointments have been scheduled.

## 2022-01-02 NOTE — Telephone Encounter (Signed)
Called [facility] giving verbal orders, will send message to nurse(Precious)

## 2022-01-02 NOTE — Progress Notes (Unsigned)
No SHOW

## 2022-01-07 DIAGNOSIS — L89623 Pressure ulcer of left heel, stage 3: Secondary | ICD-10-CM | POA: Diagnosis not present

## 2022-01-07 DIAGNOSIS — I70248 Atherosclerosis of native arteries of left leg with ulceration of other part of lower left leg: Secondary | ICD-10-CM | POA: Diagnosis not present

## 2022-01-07 DIAGNOSIS — I129 Hypertensive chronic kidney disease with stage 1 through stage 4 chronic kidney disease, or unspecified chronic kidney disease: Secondary | ICD-10-CM | POA: Diagnosis not present

## 2022-01-07 DIAGNOSIS — J449 Chronic obstructive pulmonary disease, unspecified: Secondary | ICD-10-CM | POA: Diagnosis not present

## 2022-01-07 DIAGNOSIS — F3341 Major depressive disorder, recurrent, in partial remission: Secondary | ICD-10-CM | POA: Diagnosis not present

## 2022-01-07 DIAGNOSIS — N183 Chronic kidney disease, stage 3 unspecified: Secondary | ICD-10-CM | POA: Diagnosis not present

## 2022-01-07 DIAGNOSIS — F419 Anxiety disorder, unspecified: Secondary | ICD-10-CM | POA: Diagnosis not present

## 2022-01-07 DIAGNOSIS — E785 Hyperlipidemia, unspecified: Secondary | ICD-10-CM | POA: Diagnosis not present

## 2022-01-09 ENCOUNTER — Other Ambulatory Visit: Payer: Self-pay

## 2022-01-09 ENCOUNTER — Encounter: Payer: Self-pay | Admitting: *Deleted

## 2022-01-09 ENCOUNTER — Emergency Department: Payer: Medicare HMO

## 2022-01-09 ENCOUNTER — Telehealth: Payer: Self-pay | Admitting: *Deleted

## 2022-01-09 ENCOUNTER — Inpatient Hospital Stay
Admission: EM | Admit: 2022-01-09 | Discharge: 2022-01-12 | DRG: 593 | Disposition: A | Discharge status: Skilled Nursing Facility | Payer: Medicare HMO | Attending: Internal Medicine | Admitting: Internal Medicine

## 2022-01-09 DIAGNOSIS — L03116 Cellulitis of left lower limb: Secondary | ICD-10-CM | POA: Diagnosis not present

## 2022-01-09 DIAGNOSIS — F32A Depression, unspecified: Secondary | ICD-10-CM | POA: Diagnosis present

## 2022-01-09 DIAGNOSIS — J449 Chronic obstructive pulmonary disease, unspecified: Secondary | ICD-10-CM | POA: Diagnosis not present

## 2022-01-09 DIAGNOSIS — S9032XA Contusion of left foot, initial encounter: Secondary | ICD-10-CM | POA: Diagnosis not present

## 2022-01-09 DIAGNOSIS — Z8572 Personal history of non-Hodgkin lymphomas: Secondary | ICD-10-CM | POA: Diagnosis not present

## 2022-01-09 DIAGNOSIS — Z6841 Body Mass Index (BMI) 40.0 and over, adult: Secondary | ICD-10-CM

## 2022-01-09 DIAGNOSIS — I129 Hypertensive chronic kidney disease with stage 1 through stage 4 chronic kidney disease, or unspecified chronic kidney disease: Secondary | ICD-10-CM | POA: Diagnosis present

## 2022-01-09 DIAGNOSIS — L97509 Non-pressure chronic ulcer of other part of unspecified foot with unspecified severity: Secondary | ICD-10-CM | POA: Diagnosis not present

## 2022-01-09 DIAGNOSIS — M79672 Pain in left foot: Secondary | ICD-10-CM | POA: Diagnosis not present

## 2022-01-09 DIAGNOSIS — M86172 Other acute osteomyelitis, left ankle and foot: Secondary | ICD-10-CM | POA: Diagnosis not present

## 2022-01-09 DIAGNOSIS — N183 Chronic kidney disease, stage 3 unspecified: Secondary | ICD-10-CM | POA: Diagnosis present

## 2022-01-09 DIAGNOSIS — N1832 Chronic kidney disease, stage 3b: Secondary | ICD-10-CM | POA: Diagnosis present

## 2022-01-09 DIAGNOSIS — Z7951 Long term (current) use of inhaled steroids: Secondary | ICD-10-CM | POA: Diagnosis not present

## 2022-01-09 DIAGNOSIS — Z8249 Family history of ischemic heart disease and other diseases of the circulatory system: Secondary | ICD-10-CM

## 2022-01-09 DIAGNOSIS — L97529 Non-pressure chronic ulcer of other part of left foot with unspecified severity: Secondary | ICD-10-CM | POA: Diagnosis not present

## 2022-01-09 DIAGNOSIS — Z825 Family history of asthma and other chronic lower respiratory diseases: Secondary | ICD-10-CM | POA: Diagnosis not present

## 2022-01-09 DIAGNOSIS — Z978 Presence of other specified devices: Secondary | ICD-10-CM

## 2022-01-09 DIAGNOSIS — R197 Diarrhea, unspecified: Secondary | ICD-10-CM | POA: Diagnosis present

## 2022-01-09 DIAGNOSIS — N312 Flaccid neuropathic bladder, not elsewhere classified: Secondary | ICD-10-CM | POA: Diagnosis present

## 2022-01-09 DIAGNOSIS — E11621 Type 2 diabetes mellitus with foot ulcer: Secondary | ICD-10-CM | POA: Diagnosis not present

## 2022-01-09 DIAGNOSIS — Z87891 Personal history of nicotine dependence: Secondary | ICD-10-CM | POA: Diagnosis not present

## 2022-01-09 DIAGNOSIS — Z841 Family history of disorders of kidney and ureter: Secondary | ICD-10-CM

## 2022-01-09 DIAGNOSIS — F419 Anxiety disorder, unspecified: Secondary | ICD-10-CM | POA: Diagnosis present

## 2022-01-09 DIAGNOSIS — I739 Peripheral vascular disease, unspecified: Secondary | ICD-10-CM | POA: Diagnosis present

## 2022-01-09 DIAGNOSIS — L97429 Non-pressure chronic ulcer of left heel and midfoot with unspecified severity: Secondary | ICD-10-CM

## 2022-01-09 DIAGNOSIS — R262 Difficulty in walking, not elsewhere classified: Secondary | ICD-10-CM

## 2022-01-09 DIAGNOSIS — Z7401 Bed confinement status: Secondary | ICD-10-CM

## 2022-01-09 DIAGNOSIS — R0902 Hypoxemia: Secondary | ICD-10-CM | POA: Diagnosis not present

## 2022-01-09 DIAGNOSIS — E785 Hyperlipidemia, unspecified: Secondary | ICD-10-CM | POA: Diagnosis present

## 2022-01-09 DIAGNOSIS — Z79899 Other long term (current) drug therapy: Secondary | ICD-10-CM | POA: Diagnosis not present

## 2022-01-09 DIAGNOSIS — I10 Essential (primary) hypertension: Secondary | ICD-10-CM | POA: Diagnosis present

## 2022-01-09 DIAGNOSIS — M7732 Calcaneal spur, left foot: Secondary | ICD-10-CM | POA: Diagnosis not present

## 2022-01-09 DIAGNOSIS — I959 Hypotension, unspecified: Secondary | ICD-10-CM | POA: Diagnosis not present

## 2022-01-09 DIAGNOSIS — N1831 Chronic kidney disease, stage 3a: Secondary | ICD-10-CM | POA: Diagnosis not present

## 2022-01-09 DIAGNOSIS — G4733 Obstructive sleep apnea (adult) (pediatric): Secondary | ICD-10-CM | POA: Diagnosis present

## 2022-01-09 DIAGNOSIS — A419 Sepsis, unspecified organism: Secondary | ICD-10-CM | POA: Diagnosis not present

## 2022-01-09 DIAGNOSIS — G629 Polyneuropathy, unspecified: Secondary | ICD-10-CM | POA: Diagnosis present

## 2022-01-09 DIAGNOSIS — G35 Multiple sclerosis: Secondary | ICD-10-CM | POA: Diagnosis not present

## 2022-01-09 DIAGNOSIS — M79673 Pain in unspecified foot: Secondary | ICD-10-CM | POA: Diagnosis not present

## 2022-01-09 LAB — BASIC METABOLIC PANEL
Anion gap: 9 (ref 5–15)
BUN: 23 mg/dL (ref 8–23)
CO2: 25 mmol/L (ref 22–32)
Calcium: 9.1 mg/dL (ref 8.9–10.3)
Chloride: 104 mmol/L (ref 98–111)
Creatinine, Ser: 1.28 mg/dL — ABNORMAL HIGH (ref 0.44–1.00)
GFR, Estimated: 45 mL/min — ABNORMAL LOW (ref >60)
Glucose, Bld: 95 mg/dL (ref 70–99)
Potassium: 4 mmol/L (ref 3.5–5.1)
Sodium: 138 mmol/L (ref 135–145)

## 2022-01-09 LAB — CBC
HCT: 38.8 % (ref 36.0–46.0)
Hemoglobin: 11.7 g/dL — ABNORMAL LOW (ref 12.0–15.0)
MCH: 27.3 pg (ref 26.0–34.0)
MCHC: 30.2 g/dL (ref 30.0–36.0)
MCV: 90.7 fL (ref 80.0–100.0)
Platelets: 218 10*3/uL (ref 150–400)
RBC: 4.28 MIL/uL (ref 3.87–5.11)
RDW: 14.4 % (ref 11.5–15.5)
WBC: 9.3 10*3/uL (ref 4.0–10.5)
nRBC: 0 % (ref 0.0–0.2)

## 2022-01-09 MED ORDER — OXYCODONE HCL 5 MG PO TABS
5.0000 mg | ORAL_TABLET | Freq: Once | ORAL | Status: AC
  Administered 2022-01-10: 5 mg via ORAL
  Filled 2022-01-09: qty 1

## 2022-01-09 MED ORDER — ACETAMINOPHEN 500 MG PO TABS
1000.0000 mg | ORAL_TABLET | Freq: Once | ORAL | Status: AC
  Administered 2022-01-10: 1000 mg via ORAL
  Filled 2022-01-09: qty 2

## 2022-01-09 NOTE — ED Triage Notes (Signed)
Pt brought in via ems from home.  Pt has bruising and pain in left foot.  Pt reports having surgery on foot approx 2 weeks ago.   Pain is worse and pt has bloody drainage from heel area.  Pt alert  speech clear.

## 2022-01-09 NOTE — Telephone Encounter (Signed)
[facility] OT would like verbal orders for 1 x weekly for 6 weeks also  hospital bed, holler lift, please advise.

## 2022-01-09 NOTE — ED Provider Notes (Signed)
[facility] Provider Note    Event Date/Time   First MD Initiated Contact with Patient 01/09/22 2316     (approximate)   History   Foot Pain   HPI  [name] is a 71 y.o. female who presents to the ED for evaluation of Foot Pain   I review 8/30 medical DC summary.  Patient is a history of neuropathy and morbid obesity.  She was admitted for a nonhealing ulcerative wound to her left heel with associated cellulitis.  Taken to the OR with podiatry for debridement, MRI with early osteo.  Offered rehab but declined and went home with home health.  Bone biopsy without osteo, ID recommended 2 weeks of Augmentin.   Patient presents to the ED because "my nurse at home told me to."  She reports that she has had atraumatic pain to her left heel over the past 4 days, but has not seen her foot and ankle since the surgery.  She has been bedbound and nonambulatory since the surgery.  Reports that she thinks the bandage might of been too tight and she only has Tylenol at home for pain.  Denies fevers or systemic symptoms.  Reports her nurse changed her bandage today and told her she might need to get checked out, but she is not sure why otherwise.  She reports compliance with her Augmentin and has only 1 or 2 days left.  Physical Exam   Triage Vital Signs: ED Triage Vitals  Enc Vitals Group     BP 01/09/22 1933 (!) 104/53     Pulse Rate 01/09/22 1933 60     Resp 01/09/22 1933 20     Temp 01/09/22 1933 98.9 F (37.2 C)     Temp Source 01/09/22 1933 Oral     SpO2 01/09/22 1933 92 %     Weight 01/09/22 1930 275 lb 9.2 oz (125 kg)     Height 01/09/22 1930 '5\' 4"'$  (1.626 m)     Head Circumference --      Peak Flow --      Pain Score 01/09/22 1929 7     Pain Loc --      Pain Edu? --      Excl. in [place]? --     Most recent vital signs: Vitals:   01/09/22 1933 01/09/22 2328  BP: (!) 104/53 (!) 105/50  Pulse: 60 61  Resp: 20 18  Temp: 98.9 F (37.2 C) 98.6 F (37  C)  SpO2: 92% 95%    General: Awake, no distress.  Chronically ill-appearing, pleasant and conversational in full sentences. CV:  Good peripheral perfusion.  Resp:  Normal effort.  Abd:  No distention.  MSK:  Anteriorly/dorsally over her left foot and ankle she has a confluent area of erythema and bruising without evidence of laceration.  Within this appears to be a closed bulla that is about 4 cm across.  No induration, heat.  Minimally tender.  Unable to lift up her leg by squeezing her midfoot and raising without much pain so I can examine her heel.  She has an open ulcerative wound to right heel with good granulation tissue, as pictured below.  No purulence or surrounding induration.  Calf is soft and nontender. Neuro:  No focal deficits appreciated. Other:         ED Results / Procedures / Treatments   Labs (all labs ordered are listed, but only abnormal results are displayed) Labs Reviewed  BASIC METABOLIC  PANEL - Abnormal; Notable for the following components:      Result Value   Creatinine, Ser 1.28 (*)    GFR, Estimated 45 (*)    All other components within normal limits  CBC - Abnormal; Notable for the following components:   Hemoglobin 11.7 (*)    All other components within normal limits  PROCALCITONIN  PROCALCITONIN    EKG   RADIOLOGY Plain film of the left foot interpreted by me without evidence of osteomyelitis  Official radiology report(s): DG Foot Complete Left  Result Date: 01/10/2022 CLINICAL DATA:  Pain, bruising in left foot EXAM: LEFT FOOT - COMPLETE 3+ VIEW COMPARISON:  None Available. FINDINGS: Plantar calcaneal spur. No acute bony abnormality. Specifically, no fracture, subluxation, or dislocation. Small linear density seen in the plantar soft tissues of unknown etiology. This could reflect a small foreign body or soft tissue calcification. IMPRESSION: No acute bony abnormality. Small linear density in the plantar soft tissues which could reflect a  small foreign body or soft tissue calcification. Electronically Signed   By: Rolm Baptise M.D.   On: 01/10/2022 00:14    PROCEDURES and INTERVENTIONS:  Procedures  Medications  acetaminophen (TYLENOL) tablet 1,000 mg (1,000 mg Oral Given 01/10/22 0006)  oxyCODONE (Oxy IR/ROXICODONE) immediate release tablet 5 mg (5 mg Oral Given 01/10/22 0006)     IMPRESSION / MDM / ASSESSMENT AND PLAN / ED COURSE  I reviewed the triage vital signs and the nursing notes.  Differential diagnosis includes, but is not limited to, uncontrolled pain, cellulitis, osteomyelitis, fracture, dislocation, sepsis  {Patient presents with symptoms of an acute illness or injury that is potentially life-threatening.  71 year old female 2 weeks out from a debridement of a ulcerative heel wound presents with 4 days of pain.  She looks systemically well and without signs of sepsis or systemic illness.  No falls or injuries.  Screening blood work is benign with no leukocytosis or metabolic derangements.  She is CKD around baseline.  We will x-ray her foot to assess for signs of osteomyelitis or subcutaneous gas.  We will add on a procalcitonin and treat her pain with oral medications and reassess.  Her procalcitonin returns negative, considering her compliance with her Augmentin and reassuring work-up and exam, I doubt superimposed infection.  She is continued pain and difficulty ambulating.  She is now willing to go to a rehab facility and does not want to go back home.  We will consult medicine and see if we can get her under observation to allow for PT and placement.  Clinical Course as of 01/10/22 0109  Thu Jan 10, 2022  0104 Reassessed and discussed disposition. [DS]    Clinical Course User Index [DS] Vladimir Crofts, MD     FINAL CLINICAL IMPRESSION(S) / ED DIAGNOSES   Final diagnoses:  Foot pain, left  Neuropathic ulcer of left heel, unspecified ulcer stage [facility])  Ambulatory dysfunction     Rx / DC Orders    ED Discharge Orders     None        Note:  This document was prepared using Dragon voice recognition software and may include unintentional dictation errors.   Vladimir Crofts, MD 01/10/22 0110

## 2022-01-10 DIAGNOSIS — N1831 Chronic kidney disease, stage 3a: Secondary | ICD-10-CM | POA: Diagnosis not present

## 2022-01-10 DIAGNOSIS — G35 Multiple sclerosis: Secondary | ICD-10-CM

## 2022-01-10 DIAGNOSIS — L97509 Non-pressure chronic ulcer of other part of unspecified foot with unspecified severity: Secondary | ICD-10-CM | POA: Diagnosis present

## 2022-01-10 DIAGNOSIS — L97529 Non-pressure chronic ulcer of other part of left foot with unspecified severity: Secondary | ICD-10-CM

## 2022-01-10 LAB — PROCALCITONIN
Procalcitonin: <0.1 ng/mL
Procalcitonin: <0.1 ng/mL

## 2022-01-10 MED ORDER — BUPROPION HCL ER (XL) 150 MG PO TB24
300.0000 mg | ORAL_TABLET | Freq: Every day | ORAL | Status: DC
  Administered 2022-01-10 – 2022-01-12 (×3): 300 mg via ORAL
  Filled 2022-01-10 (×3): qty 2

## 2022-01-10 MED ORDER — HYDRALAZINE HCL 50 MG PO TABS
50.0000 mg | ORAL_TABLET | Freq: Three times a day (TID) | ORAL | Status: DC
  Administered 2022-01-10 – 2022-01-11 (×3): 50 mg via ORAL
  Filled 2022-01-10 (×4): qty 1

## 2022-01-10 MED ORDER — DOCUSATE SODIUM 100 MG PO CAPS: 100.0000 mg | ORAL_CAPSULE | Freq: Two times a day (BID) | ORAL | Status: DC | PRN

## 2022-01-10 MED ORDER — LISINOPRIL 10 MG PO TABS
20.0000 mg | ORAL_TABLET | Freq: Every day | ORAL | Status: DC
  Administered 2022-01-11: 20 mg via ORAL
  Filled 2022-01-10 (×2): qty 2

## 2022-01-10 MED ORDER — ACETAMINOPHEN 500 MG PO TABS
1000.0000 mg | ORAL_TABLET | Freq: Three times a day (TID) | ORAL | Status: DC | PRN
  Administered 2022-01-10: 1000 mg via ORAL
  Filled 2022-01-10: qty 2

## 2022-01-10 MED ORDER — TORSEMIDE 20 MG PO TABS: 20.0000 mg | ORAL_TABLET | Freq: Every day | ORAL | Status: DC | PRN

## 2022-01-10 MED ORDER — CLONAZEPAM 0.5 MG PO TABS
0.5000 mg | ORAL_TABLET | Freq: Two times a day (BID) | ORAL | Status: DC
  Administered 2022-01-10 – 2022-01-12 (×5): 0.5 mg via ORAL
  Filled 2022-01-10 (×5): qty 1

## 2022-01-10 MED ORDER — ENOXAPARIN SODIUM 80 MG/0.8ML IJ SOSY
0.5000 mg/kg | PREFILLED_SYRINGE | INTRAMUSCULAR | Status: DC
  Administered 2022-01-10 – 2022-01-11 (×2): 62.5 mg via SUBCUTANEOUS
  Filled 2022-01-10 (×2): qty 0.63

## 2022-01-10 MED ORDER — VORTIOXETINE HBR 5 MG PO TABS
5.0000 mg | ORAL_TABLET | Freq: Every day | ORAL | Status: DC
  Administered 2022-01-10 – 2022-01-12 (×3): 5 mg via ORAL
  Filled 2022-01-10 (×3): qty 1

## 2022-01-10 MED ORDER — AMLODIPINE BESYLATE 5 MG PO TABS
5.0000 mg | ORAL_TABLET | Freq: Every day | ORAL | Status: DC
  Administered 2022-01-11: 5 mg via ORAL
  Filled 2022-01-10 (×2): qty 1

## 2022-01-10 MED ORDER — TRAZODONE HCL 50 MG PO TABS
50.0000 mg | ORAL_TABLET | Freq: Every day | ORAL | Status: DC
  Administered 2022-01-10 – 2022-01-11 (×2): 50 mg via ORAL
  Filled 2022-01-10 (×2): qty 1

## 2022-01-10 MED ORDER — ONDANSETRON HCL 4 MG/2ML IJ SOLN: 4.0000 mg | Freq: Four times a day (QID) | INTRAMUSCULAR | Status: DC | PRN

## 2022-01-10 MED ORDER — MIRABEGRON ER 50 MG PO TB24
50.0000 mg | ORAL_TABLET | Freq: Every day | ORAL | Status: DC
  Administered 2022-01-10 – 2022-01-12 (×3): 50 mg via ORAL
  Filled 2022-01-10 (×3): qty 1

## 2022-01-10 MED ORDER — MAGNESIUM OXIDE -MG SUPPLEMENT 400 (240 MG) MG PO TABS
400.0000 mg | ORAL_TABLET | Freq: Every day | ORAL | Status: DC
  Administered 2022-01-10 – 2022-01-12 (×3): 400 mg via ORAL
  Filled 2022-01-10 (×3): qty 1

## 2022-01-10 MED ORDER — ATORVASTATIN CALCIUM 20 MG PO TABS
10.0000 mg | ORAL_TABLET | Freq: Every day | ORAL | Status: DC
  Administered 2022-01-10 – 2022-01-11 (×2): 10 mg via ORAL
  Filled 2022-01-10 (×2): qty 1

## 2022-01-10 MED ORDER — QUETIAPINE FUMARATE ER 50 MG PO TB24
150.0000 mg | ORAL_TABLET | Freq: Every day | ORAL | Status: DC
  Administered 2022-01-10 – 2022-01-11 (×2): 150 mg via ORAL
  Filled 2022-01-10 (×2): qty 3

## 2022-01-10 MED ORDER — CHLORHEXIDINE GLUCONATE CLOTH 2 % EX PADS
6.0000 | MEDICATED_PAD | Freq: Every day | CUTANEOUS | Status: DC
  Administered 2022-01-12: 6 via TOPICAL

## 2022-01-10 MED ORDER — MOMETASONE FURO-FORMOTEROL FUM 200-5 MCG/ACT IN AERO
2.0000 | INHALATION_SPRAY | Freq: Two times a day (BID) | RESPIRATORY_TRACT | Status: DC
  Administered 2022-01-10 – 2022-01-12 (×5): 2 via RESPIRATORY_TRACT
  Filled 2022-01-10: qty 8.8

## 2022-01-10 MED ORDER — OXYCODONE HCL 5 MG PO TABS
5.0000 mg | ORAL_TABLET | Freq: Four times a day (QID) | ORAL | Status: DC | PRN
  Administered 2022-01-10 – 2022-01-12 (×2): 5 mg via ORAL
  Filled 2022-01-10 (×2): qty 1

## 2022-01-10 MED ORDER — ONDANSETRON HCL 4 MG PO TABS: 4.0000 mg | ORAL_TABLET | Freq: Four times a day (QID) | ORAL | Status: DC | PRN

## 2022-01-10 MED ORDER — GABAPENTIN 600 MG PO TABS
600.0000 mg | ORAL_TABLET | Freq: Three times a day (TID) | ORAL | Status: DC
  Administered 2022-01-10 – 2022-01-12 (×8): 600 mg via ORAL
  Filled 2022-01-10 (×8): qty 1

## 2022-01-10 MED ORDER — DULOXETINE HCL 60 MG PO CPEP
60.0000 mg | ORAL_CAPSULE | Freq: Every day | ORAL | Status: DC
  Administered 2022-01-10 – 2022-01-12 (×3): 60 mg via ORAL
  Filled 2022-01-10 (×3): qty 1

## 2022-01-10 MED ORDER — ADULT MULTIVITAMIN W/MINERALS CH
1.0000 | ORAL_TABLET | Freq: Every day | ORAL | Status: DC
  Administered 2022-01-10 – 2022-01-12 (×3): 1 via ORAL
  Filled 2022-01-10 (×3): qty 1

## 2022-01-10 NOTE — Consult Note (Addendum)
  Subjective:  Patient ID: [name], female    DOB: 1950/07/18,  MRN: [id number]  A 71 y.o. female medical history significant for morbid obesity (BMI 47.30 kg/m), depression, anxiety, neuropathy, hypertension, atonic bladder status post chronic indwelling Foley catheter who was recently discharged from the hospital for left heel ulceration without underlying osteomyelitis.  Patient was not able to manage the wound at home.  Patient presented again to the emergency room for worsening pain in the left heel.  She denies any nausea fever chills vomiting.  She was unable to follow-up with the wound care center due to transportation issues.  She is bedbound and nonambulatory since the surgery.  Objective:   Vitals:   01/10/22 0925 01/10/22 1601  BP: 103/81 129/79  Pulse: 62 (!) 58  Resp: 16 16  Temp: 98.3 F (36.8 C) 98.4 F (36.9 C)  SpO2: 94% 97%   General AA&O x3. Normal mood and affect.  Vascular Dorsalis pedis and posterior tibial pulses 2/4 bilat. Brisk capillary refill to all digits. Pedal hair present.  Neurologic Epicritic sensation grossly intact.  Dermatologic Left heel wound with primarily granulation tissue with some fibrotic patches.  Some macerated periwound.  No signs of infection noted no purulent drainage noted some redness noted circumferential around the ankle either irritation versus cellulitis.  Anterior ankle skin irritation noted.  Some erythema.  No signs of infection noted.  Orthopedic: MMT 5/5 in dorsiflexion, plantarflexion, inversion, and eversion. Normal joint ROM without pain or crepitus.       Assessment & Plan:  Patient was evaluated and treated and all questions answered.  Left heel ulceration with anterior ankle erythema versus skin irritation from bandaging -All questions and concerns were discussed with the patient in extensive detail. -No surgical plans indicated at this time. -It appears that patient is unable to take care of the wound at home  she may benefit from nursing facility. -She does have some erythema for which she may need some antibiotics for 24 to 48 hours while she is inpatient. -Betadine wet-to-dry dressing change. -She will need to follow-up with the wound care center for further management. -Continue nonweightbearing left lower extremity and aggressive offloading of the heel. -Podiatry to sign off.  Please reconsult [place] if indicated.  Felipa Furnace, DPM  Accessible via secure chat for questions or concerns.

## 2022-01-10 NOTE — Assessment & Plan Note (Signed)
Complicates overall prognosis and care ?Lifestyle modification and exercise has been discussed with patient in detail ?

## 2022-01-10 NOTE — H&P (Signed)
This History and Physical    Patient: [name] VHQ:[id number] DOB: Apr 18, 1951 DOA: 01/09/2022 DOS: the patient was seen and examined on 01/10/2022 PCP: Kirk Ruths, MD  Patient coming from: Home  Chief Complaint:  Chief Complaint  Patient presents with   Foot Pain   HPI: LILLEY HUBBLE is a 71 y.o. female with medical history significant for morbid obesity (BMI 47.30 kg/m), depression, anxiety, neuropathy, hypertension, atonic bladder status post chronic indwelling Foley catheter who was recently discharged from the hospital for management of a nonhealing left heel ulcer with suspected osteomyelitis. During her last hospitalization she was seen in consultation by podiatry and had a bone biopsy which did not show any evidence of osteomyelitis.  Patient was discharged on 2 weeks of Augmentin and advised to follow-up with the wound care clinic. She presents to the ER for evaluation of worsening pain involving her left heel as well as bloody drainage from that heel.  She denies having any fever or chills.  She was seen by her home health nurse who advised that she come to the ER for evaluation.  Patient has been bedbound and nonambulatory since surgery. She denies having any chest pain, no shortness of breath, no nausea, no vomiting, no dizziness, no lightheadedness, no abdominal pain, no changes in her bowel habits, no headache, no blurred vision no focal deficit. She will be referred to observation status for further evaluation   Review of Systems: As mentioned in the history of present illness. All other systems reviewed and are negative. Past Medical History:  Diagnosis Date   Abdominal aortic atherosclerosis ([place]) 11/11/2016   ADHD    Anxiety    COPD (chronic obstructive pulmonary disease) (HCC)    Depression    major depressive   Dyspnea    doe   Edema    left leg   Follicular lymphoma (HCC)    B Cell   Follicular lymphoma grade II (HCC)    Hypertension    Hypotension     idiopathic   Kyphoscoliosis and scoliosis 11/26/2011   Morbid obesity ([place]) 01/05/2016   Multiple sclerosis (HCC)    Multiple sclerosis ([place])    1980's   Neuromuscular disorder ([place])    Obstructive and reflux uropathy    foley   Pain    atypical facial   Peripheral vascular disease of lower extremity with ulceration ([place]) 11/08/2015   Skin ulcer ([place]) 11/08/2015   Weakness    generalized. has MS   Past Surgical History:  Procedure Laterality Date   BACK SURGERY N/A 2002   BONE BIOPSY Left 12/24/2021   Procedure: BONE BIOPSY-HEEL BONE;  Surgeon: Trula Slade, DPM;  Location: [facility];  Service: Podiatry;  Laterality: Left;   CYST EXCISION     lower back   INGUINAL LYMPH NODE BIOPSY Left 07/04/2016   Procedure: INGUINAL LYMPH NODE BIOPSY;  Surgeon: Christene Lye, MD;  Location: [facility];  Service: General;  Laterality: Left;   ORIF FEMUR FRACTURE Left 02/04/2020   Procedure: OPEN REDUCTION INTERNAL FIXATION (ORIF) DISTAL FEMUR FRACTURE;  Surgeon: Shona Needles, MD;  Location: [place];  Service: Orthopedics;  Laterality: Left;   PORTACATH PLACEMENT N/A 07/22/2016   Procedure: INSERTION PORT-A-CATH;  Surgeon: Christene Lye, MD;  Location: [facility];  Service: General;  Laterality: N/A;   TONSILLECTOMY AND ADENOIDECTOMY     TUBAL LIGATION     WOUND DEBRIDEMENT Left 12/24/2021   Procedure: DEBRIDEMENT WOUND-HEEL ULCER;  Surgeon: Celesta Gentile  R, DPM;  Location: [facility];  Service: Podiatry;  Laterality: Left;   Social History:  reports that she quit smoking about 5 years ago. Her smoking use included cigarettes. She started smoking about 26 years ago. She has a 20.00 pack-year smoking history. She has never used smokeless tobacco. She reports current drug use. Drug: Marijuana. She reports that she does not drink alcohol.  No Known Allergies  Family History  Problem Relation Age of Onset   COPD Mother    Diabetes Mother    Heart failure Mother    Alcohol abuse  Father    Kidney disease Father    Kidney failure Father    Arthritis Sister    CAD Maternal Grandmother    Stroke Maternal Grandfather    Arthritis Sister    Mental illness Sister    Arthritis Brother     Prior to Admission medications   Medication Sig Start Date End Date Taking? Authorizing Provider  amLODipine (NORVASC) 5 MG tablet Take 5 mg by mouth daily. 12/09/21  Yes [provider]  atorvastatin (LIPITOR) 10 MG tablet Take 10 mg by mouth at bedtime.  01/14/19  Yes [provider]  budesonide-formoterol (SYMBICORT) 160-4.5 MCG/ACT inhaler Inhale 2 puffs into the lungs 2 (two) times daily.    Yes [provider]  buPROPion (WELLBUTRIN XL) 300 MG 24 hr tablet Take 300 mg by mouth daily. 03/21/21  Yes [provider]  clonazePAM (KLONOPIN) 0.5 MG tablet Take 1 tablet (0.5 mg total) by mouth 2 (two) times daily. 02/10/20  Yes Bonnielee Haff, MD  DULoxetine (CYMBALTA) 60 MG capsule Take 1 capsule (60 mg total) by mouth every morning. Patient taking differently: Take 60 mg by mouth daily. 10/14/16  Yes Vaughan Basta, MD  gabapentin (NEURONTIN) 600 MG tablet Take 600 mg by mouth 3 (three) times daily. 12/10/21  Yes [provider]  hydrALAZINE (APRESOLINE) 50 MG tablet Take 50 mg by mouth 3 (three) times daily. 12/12/21  Yes [provider]  lisinopril (ZESTRIL) 20 MG tablet Take 1 tablet (20 mg total) by mouth daily. 12/26/21  Yes Jennye Boroughs, MD  magnesium oxide (MAG-OX) 400 MG tablet Take 400 mg by mouth daily.   Yes [provider]  Multiple Vitamin (MULTIVITAMIN WITH MINERALS) TABS tablet Take 1 tablet by mouth daily.   Yes [provider]  MYRBETRIQ 50 MG TB24 tablet TAKE ONE TABLET BY MOUTH ONCE DAILY 04/19/20  Yes Stoioff, Ronda Fairly, MD  QUEtiapine Fumarate (SEROQUEL XR) 150 MG 24 hr tablet Take 150 mg by mouth at bedtime.    Yes [provider]  traZODone (DESYREL) 50 MG tablet Take 50 mg by  mouth at bedtime. 03/21/21  Yes [provider]  vortioxetine HBr (TRINTELLIX) 5 MG TABS tablet Take 5 mg by mouth daily.   Yes [provider]  acetaminophen (TYLENOL) 500 MG tablet Take 1,000 mg by mouth every 8 (eight) hours as needed for moderate pain.    [provider]  docusate sodium (COLACE) 100 MG capsule Take 1 capsule (100 mg total) by mouth 2 (two) times daily as needed. 11/24/19   Enzo Bi, MD  torsemide (DEMADEX) 20 MG tablet Take 1 tablet (20 mg total) by mouth daily as needed (edema). 07/05/21   Lorella Nimrod, MD    Physical Exam: Vitals:   01/10/22 0236 01/10/22 0348 01/10/22 0500 01/10/22 0746  BP: (!) 131/51 99/60 (!) 97/58 113/64  Pulse: 64 61 (!) 59 60  Resp: 18 18  18 18  Temp:  98.5 F (36.9 C)  97.7 F (36.5 C)  TempSrc:  Oral  Oral  SpO2: 95% 96% 96% 96%  Weight:      Height:       Physical Exam Vitals and nursing note reviewed.  Constitutional:      Appearance: She is obese.  HENT:     Head: Normocephalic and atraumatic.     Nose: Nose normal.     Mouth/Throat:     Mouth: Mucous membranes are moist.  Eyes:     Conjunctiva/sclera: Conjunctivae normal.  Cardiovascular:     Rate and Rhythm: Normal rate and regular rhythm.  Pulmonary:     Effort: Pulmonary effort is normal.     Breath sounds: Normal breath sounds.  Abdominal:     General: Bowel sounds are normal.     Palpations: Abdomen is soft.     Comments: Central adiposity  Musculoskeletal:        General: Normal range of motion.     Cervical back: Normal range of motion and neck supple.  Skin:    General: Skin is warm and dry.  Neurological:     Mental Status: She is alert.     Motor: Weakness present.  Psychiatric:        Mood and Affect: Mood normal.        Behavior: Behavior normal.     Data Reviewed: Relevant notes from primary care and specialist visits, past discharge summaries as available in EHR, including Care Everywhere. Prior diagnostic testing as  pertinent to current admission diagnoses Updated medications and problem lists for reconciliation ED course, including vitals, labs, imaging, treatment and response to treatment Triage notes, nursing and pharmacy notes and ED provider's notes Notable results as noted in HPI Labs reviewed.  Procalcitonin less than 0.10, sodium 138, potassium 4.0, chloride 104, bicarb 25, glucose 95, BUN 23, creatinine 1.28, calcium 9.1, white count 9.3, hemoglobin 11.7, hematocrit 38.8, RDW 14.4, platelet count 218 Left foot x-ray shows No acute bony abnormality.Small linear density in the plantar soft tissues which could reflect a small foreign body or soft tissue calcification. There are no new results to review at this time.  Assessment and Plan: * Foot ulcer ([place]) Patient presents for evaluation of a nonhealing left heel decubitus ulcer status post bone biopsy during her last hospitalization which did not show any evidence of osteomyelitis. Patient has completed a 2-week course of Augmentin She presents for evaluation of worsening pain and increased drainage from her left heel We will consult podiatry  Multiple sclerosis ([place]) With atonic neurogenic bladder Status post chronic indwelling Foley catheter  Essential hypertension Continue amlodipine, hydralazine and lisinopril for blood pressure control  Obesity, Class III, BMI 40-49.9 (morbid obesity) ([place]) Complicates overall prognosis and care Lifestyle modification and exercise has been discussed with patient in detail  Foley catheter in place on admission Chronic Stable  Depression Stable Continue Trintellix, Seroquel and Cymbalta  CKD (chronic kidney disease) stage 3, GFR 30-59 ml/min (HCC) Secondary to hypertension Renal function is stable      Advance Care Planning:   Code Status: Full Code   Consults: Podiatry, physical therapy  Family Communication: Greater than 50% of time was spent discussing patient's condition and plan of  care with her at the bedside.  All questions and concerns have been addressed.  She verbalizes understanding and agrees with the plan.  Severity of Illness: The appropriate patient status for this patient is OBSERVATION. Observation status is  judged to be reasonable and necessary in order to provide the required intensity of service to ensure the patient's safety. The patient's presenting symptoms, physical exam findings, and initial radiographic and laboratory data in the context of their medical condition is felt to place them at decreased risk for further clinical deterioration. Furthermore, it is anticipated that the patient will be medically stable for discharge from the hospital within 2 midnights of admission.   Author: Collier Bullock, MD 01/10/2022 9:21 AM  For on call review [email or web address].

## 2022-01-10 NOTE — ED Notes (Signed)
Wounds dressed by previous shift, no drainage noted, no c/o pain.

## 2022-01-10 NOTE — Evaluation (Signed)
Physical Therapy Evaluation Patient Details Name: [name] MRN: [id number] DOB: 1950-07-16 Today's Date: 01/10/2022  History of Present Illness  [name] is a 71 y.o. female with medical history significant for morbid obesity (BMI 47.30 kg/m), depression, anxiety, neuropathy, hypertension, atonic bladder status post chronic indwelling Foley catheter who was recently discharged from the hospital for management of a nonhealing left heel ulcer with suspected osteomyelitis.  Clinical Impression  Patient received in bed, she is agreeable to PT assessment. Patient requires mod A for supine >< sit. She has poor sitting balance and tolerance. Unsafe to attempt transfer at this time. She will continue to benefit from skilled PT while here to improve strength and functional independence for eventual safe return home        Recommendations for follow up therapy are one component of a multi-disciplinary discharge planning process, led by the attending physician.  Recommendations may be updated based on patient status, additional functional criteria and insurance authorization.  Follow Up Recommendations Skilled nursing-short term rehab (<3 hours/day) Can patient physically be transported by private vehicle: No    Assistance Recommended at Discharge Frequent or constant Supervision/Assistance  Patient can return home with the following  Two people to help with walking and/or transfers;Help with stairs or ramp for entrance;A lot of help with bathing/dressing/bathroom;Assist for transportation    Equipment Recommendations None recommended by PT  Recommendations for Other Services       Functional Status Assessment Patient has had a recent decline in their functional status and demonstrates the ability to make significant improvements in function in a reasonable and predictable amount of time.     Precautions / Restrictions Precautions Precautions: Fall Restrictions Other Position/Activity  Restrictions: 2 weeks ago patient was here and was PWB in shoe for transfers only- no podiatry consult this admission yet.      Mobility  Bed Mobility Overal bed mobility: Needs Assistance Bed Mobility: Supine to Sit, Sit to Supine     Supine to sit: Mod assist Sit to supine: Mod assist   General bed mobility comments: able to initiate movement, however needs significant assist for trunk elevation.    Transfers                   General transfer comment: unable to attempt, poor sitting endurance and balance.    Ambulation/Gait               General Gait Details: unable  Stairs            Wheelchair Mobility    Modified Rankin (Stroke Patients Only)       Balance Overall balance assessment: Needs assistance Sitting-balance support: Feet supported Sitting balance-Leahy Scale: Poor   Postural control: Posterior lean                                   Pertinent Vitals/Pain Pain Assessment Pain Assessment: Faces Faces Pain Scale: Hurts little more Pain Location: L heel Pain Descriptors / Indicators: Discomfort, Sore Pain Intervention(s): Monitored during session, Repositioned    Home Living Family/patient expects to be discharged to:: Skilled nursing facility Living Arrangements: Spouse/significant other Available Help at Discharge: Family;Available 24 hours/day Type of Home: House Home Access: Stairs to enter;Ramped entrance Entrance Stairs-Rails: None Entrance Stairs-Number of Steps: has a ramp, then up two more steps   Home Layout: One level Home Equipment: IT sales professional (4 wheels);Shower seat;BSC/3in1 Additional Comments:  Pt has [employer] aide that helps with shower 2x/wk. Husband rolls her in wheelchair in home to get to bathroom and lift chair. She sleeps in lift chair. Was having HHPT, OT, RN prior to this admission    Prior Function Prior Level of Function : Needs assist       Physical Assist : Mobility  (physical);ADLs (physical) Mobility (physical): Transfers ADLs (physical): Dressing;Bathing;IADLs Mobility Comments: patient has not been able to ambulate since last admission. States she wears pull ups and stays in recliner. Was trying to work with PT/OT at home. Depends on husband for most of care.       Hand Dominance   Dominant Hand: Right    Extremity/Trunk Assessment   Upper Extremity Assessment Upper Extremity Assessment: Generalized weakness    Lower Extremity Assessment Lower Extremity Assessment: Generalized weakness       Communication   Communication: No difficulties  Cognition Arousal/Alertness: Awake/alert Behavior During Therapy: Flat affect Overall Cognitive Status: Within Functional Limits for tasks assessed                                 General Comments: pleasant and agreeable to mobility        General Comments      Exercises     Assessment/Plan    PT Assessment Patient needs continued PT services  PT Problem List Decreased strength;Decreased activity tolerance;Decreased balance;Decreased mobility;Obesity;Pain       PT Treatment Interventions DME instruction;Functional mobility training;Therapeutic activities;Patient/family education;Therapeutic exercise    PT Goals (Current goals can be found in the Care Plan section)  Acute Rehab PT Goals Patient Stated Goal: to go to SNF then home PT Goal Formulation: With patient Time For Goal Achievement: 01/24/22 Potential to Achieve Goals: Good    Frequency Min 2X/week     Co-evaluation               AM-PAC PT "6 Clicks" Mobility  Outcome Measure Help needed turning from your back to your side while in a flat bed without using bedrails?: A Lot Help needed moving from lying on your back to sitting on the side of a flat bed without using bedrails?: A Lot Help needed moving to and from a bed to a chair (including a wheelchair)?: Total Help needed standing up from a chair  using your arms (e.g., wheelchair or bedside chair)?: Total Help needed to walk in hospital room?: Total Help needed climbing 3-5 steps with a railing? : Total 6 Click Score: 8    End of Session   Activity Tolerance: Patient limited by fatigue Patient left: in bed;with call bell/phone within reach Nurse Communication: Mobility status PT Visit Diagnosis: Other abnormalities of gait and mobility (R26.89);Muscle weakness (generalized) (M62.81);Pain Pain - Right/Left: Left Pain - part of body: Ankle and joints of foot    Time: 3212-2482 PT Time Calculation (min) (ACUTE ONLY): 14 min   Charges:   PT Evaluation $PT Eval Moderate Complexity: 1 Mod          Renate Danh, PT, GCS 01/10/22,1:20 PM

## 2022-01-10 NOTE — NC FL2 (Signed)
[place] LEVEL OF CARE SCREENING TOOL     IDENTIFICATION  Patient Name: [name] Birthdate: 11/25/1950 Sex: female Admission Date (Current Location): 01/09/2022  [facility] and [place] Number:  Engineering geologist and Address:         Provider Number: [id number]  Attending Physician Name and Address:  Collier Bullock, MD  Relative Name and Phone Number:       Current Level of Care: Hospital Recommended Level of Care: [place] Prior Approval Number:    Date Approved/Denied:   PASRR Number: [id number]  Discharge Plan: SNF    Current Diagnoses: Patient Active Problem List   Diagnosis Date Noted   Foot ulcer ([place]) 01/10/2022   Ulcer of left heel ([place])    Non healing left heel wound 12/22/2021   Femur fracture, left ([place]) 02/04/2020   Left leg weakness 01/26/2020   COPD (chronic obstructive pulmonary disease) ([place]) 01/01/2020   Foley catheter in place on admission 11/19/2019   Neuropathy    Hyperlipidemia    Chronic kidney disease (CKD), stage III (moderate) ([place]) 04/20/2019   Ovarian mass, left 01/27/2019   Depression 05/13/2018   Recurrent cellulitis of lower extremity 06/25/2017   Medication monitoring encounter 06/05/2017   Cellulitis of left lower leg 04/25/2017   Adjustment disorder with mixed disturbance of emotions and conduct 04/23/2017   Left ovarian cyst 12/09/2016   Abdominal aortic atherosclerosis ([place]) 11/11/2016   Obstructive sleep apnea 58/52/7782   Follicular lymphoma of intra-abdominal lymph nodes ([place]) 08/06/2016   Obesity, Class III, BMI 40-49.9 (morbid obesity) ([place]) 01/05/2016   Skin ulcer ([place]) 11/08/2015   Peripheral vascular disease of lower extremity with ulceration ([place]) 11/08/2015   CKD (chronic kidney disease) stage 3, GFR 30-59 ml/min ([place]) 11/08/2015   Constipation due to pain medication 01/31/2015   Obstructive sleep apnea of adult 01/13/2015   Atonic neurogenic bladder 10/24/2014   COPD with  bronchial hyperresponsiveness ([place]) 10/02/2013   Major depressive disorder, recurrent, in partial remission ([place]) 10/02/2013   Essential hypertension 10/02/2013   Multiple sclerosis ([place]) 10/02/2013   Absence of bladder continence 09/25/2012   Kyphoscoliosis and scoliosis 11/26/2011    Orientation RESPIRATION BLADDER Height & Weight     Self, Time, Situation, Place  Normal Incontinent, Indwelling catheter Weight: 125 kg Height:  '5\' 4"'$  (162.6 cm)  BEHAVIORAL SYMPTOMS/MOOD NEUROLOGICAL BOWEL NUTRITION STATUS      Incontinent Diet (2g sodium)  AMBULATORY STATUS COMMUNICATION OF NEEDS Skin   Extensive Assist Verbally PU Stage and Appropriate Care (Unstageable wound, and blister)                       Personal Care Assistance Level of Assistance              Functional Limitations Info             SPECIAL CARE FACTORS FREQUENCY  PT (By licensed PT), OT (By licensed OT)                    Contractures      Additional Factors Info  Code Status, Allergies, Isolation Precautions Code Status Info: full Allergies Info: NKDA     Isolation Precautions Info: VRE, MRSA     Current Medications (01/10/2022):  This is the current hospital active medication list Current Facility-Administered Medications  Medication Dose Route Frequency Provider Last Rate Last Admin   acetaminophen (TYLENOL) tablet 1,000 mg  1,000 mg Oral Q8H PRN Agbata,  Tochukwu, MD       amLODipine (NORVASC) tablet 5 mg  5 mg Oral Daily Agbata, Tochukwu, MD       atorvastatin (LIPITOR) tablet 10 mg  10 mg Oral QHS Agbata, Tochukwu, MD       buPROPion (WELLBUTRIN XL) 24 hr tablet 300 mg  300 mg Oral Daily Agbata, Tochukwu, MD   300 mg at 01/10/22 1135   Chlorhexidine Gluconate Cloth 2 % PADS 6 each  6 each Topical Daily Agbata, Tochukwu, MD       clonazePAM (KLONOPIN) tablet 0.5 mg  0.5 mg Oral BID Agbata, Tochukwu, MD   0.5 mg at 01/10/22 1134   docusate sodium (COLACE) capsule 100 mg  100 mg Oral BID  PRN Agbata, Tochukwu, MD       DULoxetine (CYMBALTA) DR capsule 60 mg  60 mg Oral Daily Agbata, Tochukwu, MD   60 mg at 01/10/22 1135   enoxaparin (LOVENOX) injection 62.5 mg  0.5 mg/kg Subcutaneous Q24H Agbata, Tochukwu, MD       gabapentin (NEURONTIN) tablet 600 mg  600 mg Oral TID Agbata, Tochukwu, MD   600 mg at 01/10/22 1134   hydrALAZINE (APRESOLINE) tablet 50 mg  50 mg Oral TID Agbata, Tochukwu, MD       lisinopril (ZESTRIL) tablet 20 mg  20 mg Oral Daily Agbata, Tochukwu, MD       magnesium oxide (MAG-OX) tablet 400 mg  400 mg Oral Daily Agbata, Tochukwu, MD   400 mg at 01/10/22 1134   mirabegron ER (MYRBETRIQ) tablet 50 mg  50 mg Oral Daily Agbata, Tochukwu, MD   50 mg at 01/10/22 1135   mometasone-formoterol (DULERA) 200-5 MCG/ACT inhaler 2 puff  2 puff Inhalation BID Agbata, Tochukwu, MD   2 puff at 01/10/22 1135   multivitamin with minerals tablet 1 tablet  1 tablet Oral Daily Agbata, Tochukwu, MD   1 tablet at 01/10/22 1135   ondansetron (ZOFRAN) tablet 4 mg  4 mg Oral Q6H PRN Agbata, Tochukwu, MD       Or   ondansetron (ZOFRAN) injection 4 mg  4 mg Intravenous Q6H PRN Agbata, Tochukwu, MD       QUEtiapine (SEROQUEL XR) 24 hr tablet 150 mg  150 mg Oral QHS Agbata, Tochukwu, MD       torsemide (DEMADEX) tablet 20 mg  20 mg Oral Daily PRN Agbata, Tochukwu, MD       traZODone (DESYREL) tablet 50 mg  50 mg Oral QHS Agbata, Tochukwu, MD       vortioxetine HBr (TRINTELLIX) tablet 5 mg  5 mg Oral Daily Agbata, Tochukwu, MD   5 mg at 01/10/22 1135   Facility-Administered Medications Ordered in Other Encounters  Medication Dose Route Frequency Provider Last Rate Last Admin   heparin lock flush 100 unit/mL  500 Units Intracatheter Once PRN Sindy Guadeloupe, MD         Discharge Medications: Please see discharge summary for a list of discharge medications.  Relevant Imaging Results:  Relevant Lab Results:   Additional Information SS# [id number]  Beverly Sessions, RN

## 2022-01-10 NOTE — Assessment & Plan Note (Signed)
Secondary to hypertension Renal function is stable

## 2022-01-10 NOTE — ED Notes (Signed)
Pt alert and oriented x 4, NAD at this time.

## 2022-01-10 NOTE — Progress Notes (Signed)
PHARMACIST - PHYSICIAN COMMUNICATION  CONCERNING:  Enoxaparin (Lovenox) for DVT Prophylaxis    RECOMMENDATION: Patient was prescribed enoxaprin '40mg'$  q24 hours for VTE prophylaxis.   Filed Weights   01/09/22 1930  Weight: 125 kg (275 lb 9.2 oz)    Body mass index is 47.3 kg/m.  Estimated Creatinine Clearance: 52.7 mL/min (A) (by C-G formula based on SCr of 1.28 mg/dL (H)).   Based on [place] patient is candidate for enoxaparin 0.'5mg'$ /kg TBW SQ every 24 hours based on BMI being >30.  DESCRIPTION: Pharmacy has adjusted enoxaparin dose per [facility] policy.  Patient is now receiving enoxaparin 62.5 mg every 24 hours    Pernell Dupre, PharmD, BCPS Clinical Pharmacist 01/10/2022 9:42 AM

## 2022-01-10 NOTE — Assessment & Plan Note (Signed)
With atonic neurogenic bladder Status post chronic indwelling Foley catheter

## 2022-01-10 NOTE — TOC Initial Note (Signed)
Transition of Care [facility]) - Initial/Assessment Note    Patient Details  Name: [name] MRN: [id number] Date of Birth: 03/10/1951  Transition [facility]) CM/SW Contact:    Beverly Sessions, RN Phone Number: 01/10/2022, 3:59 PM  Clinical Narrative:                 Patient assessed by [facility] 8/27 please see note from that admission below "[facility] consulted for "Patient is unable to go to doctor's appointments, husband unable to help patient."  CSW spoke with patient. Patient lives with her husband who she stated is able to drive her to appointments. CSW inquired about issues with getting to appointments, patient states the issue is her house is cluttered. Patient states her husband is supposed to be working on decluttering the house now. CSW asked if patient needs contact info for DSS to see if they can provide resources for decluttering, patient denies resources at this time.  CSW asked for specific needs regarding to husband unable to help patient. Patient states her husband feels overwhelmed by doing everything in the home. Patient states it would help if she could get a new wheelchair as hers doesn't work well. CSW reached out to [employer], spoke to Mardene Celeste who stated patient got her wheelchair in Feb 2021 so would not be eligible for a new one through insurance until 2026. Per Mardene Celeste, to private pay for a wheelchair would be about $150 a month. Per Mardene Celeste, patient owns the wheelchair now so [employer] cannot go out to try to fix it. Updated patient. Patient says she cannot afford $150 a month. Discussed options of ordering online or going to hospice thrift store. Patient verbalized understanding.  Patient states her PCP is Dr. Ouida Sills. Patient gets her medications mailed from [facility] and some from [facility] in [place]. Patient states she is active with [facility] for [facility], CSW reached out to [place] with [facility] to confirm- awaiting response. Patient states she has a shower stool,  raised toilet seat, rolling walker, and wheelchair at home. Patient went to [facility] in the past. "  Met with patient at bedside.  Therapy recommending SNF. Patient is agreeable.  Existing PASSR Fl2 sent for signature Bed search initiated         Patient Goals and CMS Choice        Expected Discharge Plan and Services                                                Prior Living Arrangements/Services                       Activities of Daily Living Home Assistive Devices/Equipment: [name] (specify type) ADL Screening (condition at time of admission) Patient's cognitive ability adequate to safely complete daily activities?: Yes Is the patient deaf or have difficulty hearing?: No Does the patient have difficulty seeing, even when wearing glasses/contacts?: No Does the patient have difficulty concentrating, remembering, or making decisions?: No Patient able to express need for assistance with ADLs?: Yes Does the patient have difficulty dressing or bathing?: Yes Independently performs ADLs?: No Communication: Appropriate for developmental age Is this a change from baseline?: Pre-admission baseline Dressing (OT): Needs assistance Is this a change from baseline?: Pre-admission baseline Grooming: Needs assistance Is this a change from baseline?: Pre-admission baseline  Feeding: Independent Bathing: Needs assistance Is this a change from baseline?: Pre-admission baseline Toileting: Dependent Is this a change from baseline?: Pre-admission baseline In/Out Bed: Needs assistance Is this a change from baseline?: Pre-admission baseline Walks in Home: Needs assistance Is this a change from baseline?: Pre-admission baseline Does the patient have difficulty walking or climbing stairs?: Yes Weakness of Legs: Both Weakness of Arms/Hands: Left  Permission Sought/Granted                  Emotional Assessment              Admission diagnosis:  Foot  ulcer ([place]) [L97.509] Foot pain, left [M79.672] Ambulatory dysfunction [R26.2] Neuropathic ulcer of left heel, unspecified ulcer stage ([place]) [L97.429] Patient Active Problem List   Diagnosis Date Noted   Foot ulcer ([place]) 01/10/2022   Ulcer of left heel ([place])    Non healing left heel wound 12/22/2021   Femur fracture, left ([place]) 02/04/2020   Left leg weakness 01/26/2020   COPD (chronic obstructive pulmonary disease) ([place]) 01/01/2020   Foley catheter in place on admission 11/19/2019   Neuropathy    Hyperlipidemia    Chronic kidney disease (CKD), stage III (moderate) (HCC) 04/20/2019   Ovarian mass, left 01/27/2019   Depression 05/13/2018   Recurrent cellulitis of lower extremity 06/25/2017   Medication monitoring encounter 06/05/2017   Cellulitis of left lower leg 04/25/2017   Adjustment disorder with mixed disturbance of emotions and conduct 04/23/2017   Left ovarian cyst 12/09/2016   Abdominal aortic atherosclerosis ([place]) 11/11/2016   Obstructive sleep apnea 59/16/3846   Follicular lymphoma of intra-abdominal lymph nodes ([place]) 08/06/2016   Obesity, Class III, BMI 40-49.9 (morbid obesity) ([place]) 01/05/2016   Skin ulcer ([place]) 11/08/2015   Peripheral vascular disease of lower extremity with ulceration ([place]) 11/08/2015   CKD (chronic kidney disease) stage 3, GFR 30-59 ml/min (HCC) 11/08/2015   Constipation due to pain medication 01/31/2015   Obstructive sleep apnea of adult 01/13/2015   Atonic neurogenic bladder 10/24/2014   COPD with bronchial hyperresponsiveness ([place]) 10/02/2013   Major depressive disorder, recurrent, in partial remission ([place]) 10/02/2013   Essential hypertension 10/02/2013   Multiple sclerosis ([place]) 10/02/2013   Absence of bladder continence 09/25/2012   Kyphoscoliosis and scoliosis 11/26/2011   PCP:  Kirk Ruths, MD Pharmacy:   [facility] [place] [address] [place] [address] Phone: [phone] Fax:  [id number] [address], [place] AT [facility] [address] [place] [address] Phone: [phone] Fax: [phone]     Social Determinants of Health (SDOH) Interventions    Readmission Risk Interventions    03/26/2021    4:21 PM 01/03/2020    3:37 PM 04/22/2019   10:50 AM  Readmission Risk Prevention Plan  Transportation Screening Complete Complete Complete  PCP or Specialist Appt within 3-5 Days Complete    Social Work Consult for Recovery Care Planning/Counseling Complete    Palliative Care Screening Complete    Medication Review Press photographer) Complete Complete Referral to Pharmacy  PCP or Specialist appointment within 3-5 days of discharge  Complete Complete  HRI or Home Care Consult  Complete Complete  SW Recovery Care/Counseling Consult  Complete Complete  Palliative Care Screening  Not Applicable Not Applicable  Skilled Nursing Facility   Complete

## 2022-01-10 NOTE — Assessment & Plan Note (Signed)
Chronic Stable 

## 2022-01-10 NOTE — Assessment & Plan Note (Signed)
Stable Continue Trintellix, Seroquel and Cymbalta

## 2022-01-10 NOTE — Consult Note (Signed)
WOC Nurse Consult Note: Reason for Consult:Chronic nonhealing wound to left heel.  Discoloration to left dorsal foot and increased pain.  HH recommended she come in as she has nearly completed her discharge antibiotics with no improvement.  She does not feel like she can manage at home any longer.  Wound type: Neuropathic  Pressure Injury POA: NA Measurement: 6 cm x 4 cm x 0.5 cm  Wound EXB:MWUXL red Drainage (amount, consistency, odor) minimal serosanguinous   Periwound: Discoloration and pain to top of left dorsal foot.  Negative for fracture.  Dressing procedure/placement/frequency: Cleanse wound to left heel with NS and pat dry. Apply aquacel to wound bed.  Cover with dry gauze and kerlix.  Secure with tape.  Change daily.   Will not follow at this time.  Please re-consult if needed.  [name] MSN, RN, FNP-BC CWON Wound, Ostomy, Continence Nurse Pager [phone]

## 2022-01-10 NOTE — ED Notes (Addendum)
Pt transitioned to a hospital bed to promote comfort. Non-adherent dressing applied to the patients left foot and secured with curlex.

## 2022-01-10 NOTE — Care Management Obs Status (Signed)
[facility] NOTIFICATION   Patient Details  Name: [name] MRN: [id number] Date of Birth: August 18, 1950   Medicare Observation Status Notification Given:  Yes    Beverly Sessions, RN 01/10/2022, 3:56 PM

## 2022-01-10 NOTE — Assessment & Plan Note (Signed)
Patient presents for evaluation of a nonhealing left heel decubitus ulcer status post bone biopsy during her last hospitalization which did not show any evidence of osteomyelitis. Patient has completed a 2-week course of Augmentin She presents for evaluation of worsening pain and increased drainage from her left heel We will consult podiatry

## 2022-01-10 NOTE — Assessment & Plan Note (Signed)
Continue amlodipine, hydralazine and lisinopril for blood pressure control

## 2022-01-11 ENCOUNTER — Encounter: Payer: Self-pay | Admitting: Internal Medicine

## 2022-01-11 DIAGNOSIS — Z6841 Body Mass Index (BMI) 40.0 and over, adult: Secondary | ICD-10-CM | POA: Diagnosis not present

## 2022-01-11 DIAGNOSIS — R197 Diarrhea, unspecified: Secondary | ICD-10-CM | POA: Diagnosis present

## 2022-01-11 DIAGNOSIS — I959 Hypotension, unspecified: Secondary | ICD-10-CM | POA: Diagnosis present

## 2022-01-11 DIAGNOSIS — F419 Anxiety disorder, unspecified: Secondary | ICD-10-CM | POA: Diagnosis present

## 2022-01-11 DIAGNOSIS — Z7951 Long term (current) use of inhaled steroids: Secondary | ICD-10-CM | POA: Diagnosis not present

## 2022-01-11 DIAGNOSIS — Z8572 Personal history of non-Hodgkin lymphomas: Secondary | ICD-10-CM | POA: Diagnosis not present

## 2022-01-11 DIAGNOSIS — G35 Multiple sclerosis: Secondary | ICD-10-CM | POA: Diagnosis present

## 2022-01-11 DIAGNOSIS — N312 Flaccid neuropathic bladder, not elsewhere classified: Secondary | ICD-10-CM | POA: Diagnosis present

## 2022-01-11 DIAGNOSIS — J449 Chronic obstructive pulmonary disease, unspecified: Secondary | ICD-10-CM | POA: Diagnosis present

## 2022-01-11 DIAGNOSIS — Z978 Presence of other specified devices: Secondary | ICD-10-CM | POA: Diagnosis not present

## 2022-01-11 DIAGNOSIS — N1832 Chronic kidney disease, stage 3b: Secondary | ICD-10-CM | POA: Diagnosis present

## 2022-01-11 DIAGNOSIS — Z79899 Other long term (current) drug therapy: Secondary | ICD-10-CM | POA: Diagnosis not present

## 2022-01-11 DIAGNOSIS — Z841 Family history of disorders of kidney and ureter: Secondary | ICD-10-CM | POA: Diagnosis not present

## 2022-01-11 DIAGNOSIS — Z825 Family history of asthma and other chronic lower respiratory diseases: Secondary | ICD-10-CM | POA: Diagnosis not present

## 2022-01-11 DIAGNOSIS — Z7401 Bed confinement status: Secondary | ICD-10-CM | POA: Diagnosis not present

## 2022-01-11 DIAGNOSIS — G4733 Obstructive sleep apnea (adult) (pediatric): Secondary | ICD-10-CM | POA: Diagnosis present

## 2022-01-11 DIAGNOSIS — I739 Peripheral vascular disease, unspecified: Secondary | ICD-10-CM | POA: Diagnosis present

## 2022-01-11 DIAGNOSIS — L97509 Non-pressure chronic ulcer of other part of unspecified foot with unspecified severity: Secondary | ICD-10-CM | POA: Diagnosis present

## 2022-01-11 DIAGNOSIS — Z8249 Family history of ischemic heart disease and other diseases of the circulatory system: Secondary | ICD-10-CM | POA: Diagnosis not present

## 2022-01-11 DIAGNOSIS — G629 Polyneuropathy, unspecified: Secondary | ICD-10-CM | POA: Diagnosis present

## 2022-01-11 DIAGNOSIS — I129 Hypertensive chronic kidney disease with stage 1 through stage 4 chronic kidney disease, or unspecified chronic kidney disease: Secondary | ICD-10-CM | POA: Diagnosis present

## 2022-01-11 DIAGNOSIS — Z87891 Personal history of nicotine dependence: Secondary | ICD-10-CM | POA: Diagnosis not present

## 2022-01-11 DIAGNOSIS — L97529 Non-pressure chronic ulcer of other part of left foot with unspecified severity: Secondary | ICD-10-CM | POA: Diagnosis present

## 2022-01-11 DIAGNOSIS — E785 Hyperlipidemia, unspecified: Secondary | ICD-10-CM | POA: Diagnosis present

## 2022-01-11 DIAGNOSIS — F32A Depression, unspecified: Secondary | ICD-10-CM | POA: Diagnosis present

## 2022-01-11 LAB — CBC
HCT: 35.7 % — ABNORMAL LOW (ref 36.0–46.0)
Hemoglobin: 11.4 g/dL — ABNORMAL LOW (ref 12.0–15.0)
MCH: 27.3 pg (ref 26.0–34.0)
MCHC: 31.9 g/dL (ref 30.0–36.0)
MCV: 85.4 fL (ref 80.0–100.0)
Platelets: 210 10*3/uL (ref 150–400)
RBC: 4.18 MIL/uL (ref 3.87–5.11)
RDW: 14.2 % (ref 11.5–15.5)
WBC: 11.3 10*3/uL — ABNORMAL HIGH (ref 4.0–10.5)
nRBC: 0 % (ref 0.0–0.2)

## 2022-01-11 LAB — BASIC METABOLIC PANEL
Anion gap: 10 (ref 5–15)
BUN: 29 mg/dL — ABNORMAL HIGH (ref 8–23)
CO2: 23 mmol/L (ref 22–32)
Calcium: 8.9 mg/dL (ref 8.9–10.3)
Chloride: 103 mmol/L (ref 98–111)
Creatinine, Ser: 1.44 mg/dL — ABNORMAL HIGH (ref 0.44–1.00)
GFR, Estimated: 39 mL/min — ABNORMAL LOW (ref >60)
Glucose, Bld: 127 mg/dL — ABNORMAL HIGH (ref 70–99)
Potassium: 3.9 mmol/L (ref 3.5–5.1)
Sodium: 136 mmol/L (ref 135–145)

## 2022-01-11 LAB — PROCALCITONIN: Procalcitonin: <0.1 ng/mL

## 2022-01-11 MED ORDER — SODIUM CHLORIDE 0.9 % IV SOLN
1.5000 g | Freq: Four times a day (QID) | INTRAVENOUS | Status: DC
  Administered 2022-01-11 – 2022-01-12 (×5): 1.5 g via INTRAVENOUS
  Filled 2022-01-11 (×7): qty 4

## 2022-01-11 MED ORDER — HYDRALAZINE HCL 50 MG PO TABS: 50.0000 mg | ORAL_TABLET | Freq: Three times a day (TID) | ORAL | Status: DC

## 2022-01-11 NOTE — Evaluation (Signed)
Occupational Therapy Evaluation Patient Details Name: [name] MRN: [id number] DOB: 04/15/1951 Today's Date: 01/11/2022   History of Present Illness [name] is a 71 y.o. female with medical history significant for morbid obesity (BMI 47.30 kg/m), depression, anxiety, neuropathy, hypertension, atonic bladder status post chronic indwelling Foley catheter who was recently discharged from the hospital for management of a nonhealing left heel ulcer with suspected osteomyelitis.   Clinical Impression   [name] was seen for OT evaluation this date. Prior to hospital admission, pt was requiring assist from family for ADLs and w/c t/fs. Pt lives with family. Pt presents to acute OT demonstrating impaired ADL performance and functional mobility 2/2 decreased activity tolerance and functional strength/ROM/balance deficits. Pt currently requires MAX A bed mobility. MAX A hair brushing seated EOB - pt requires BUE support and MAX A for sitting balance. PT in to session to attempt transfers however pt unable to complete with +2. Pt would benefit from skilled OT to address noted impairments and functional limitations (see below for any additional details). Upon hospital discharge, recommend STR to maximize pt safety and return to PLOF.    Recommendations for follow up therapy are one component of a multi-disciplinary discharge planning process, led by the attending physician.  Recommendations may be updated based on patient status, additional functional criteria and insurance authorization.   Follow Up Recommendations  Skilled nursing-short term rehab (<3 hours/day)    Assistance Recommended at Discharge Intermittent Supervision/Assistance  Patient can return home with the following A lot of help with walking and/or transfers;A lot of help with bathing/dressing/bathroom;Assistance with cooking/housework;Help with stairs or ramp for entrance;Assist for transportation    Functional Status Assessment   Patient has had a recent decline in their functional status and demonstrates the ability to make significant improvements in function in a reasonable and predictable amount of time.  Equipment Recommendations  Other (comment) (defer)    Recommendations for Other Services       Precautions / Restrictions Precautions Precautions: Fall Restrictions Weight Bearing Restrictions: No Other Position/Activity Restrictions: 2 weeks ago patient was here and was PWB in shoe for transfers only- no podiatry consult this admission yet.      Mobility Bed Mobility Overal bed mobility: Needs Assistance Bed Mobility: Rolling, Supine to Sit, Sit to Supine Rolling: Mod assist   Supine to sit: Mod assist Sit to supine: Mod assist, +2 for physical assistance        Transfers                   General transfer comment: pt unable to achieve with +2, heavy posterior lean      Balance Overall balance assessment: Needs assistance Sitting-balance support: Feet supported Sitting balance-Leahy Scale: Poor   Postural control: Posterior lean                                 ADL either performed or assessed with clinical judgement   ADL Overall ADL's : Needs assistance/impaired                                       General ADL Comments: MAX A hair brushing seated EOB - pt requires BUE support and MAX A for sitting balance. MAX A don B socks at bed level      Pertinent Vitals/Pain Pain Assessment  Pain Assessment: No/denies pain     Hand Dominance Right   Extremity/Trunk Assessment Upper Extremity Assessment Upper Extremity Assessment: Generalized weakness   Lower Extremity Assessment Lower Extremity Assessment: Generalized weakness       Communication Communication Communication: No difficulties   Cognition Arousal/Alertness: Awake/alert Behavior During Therapy: Flat affect Overall Cognitive Status: Within Functional Limits for tasks assessed                                  General Comments: difficulty sequencing transfers      Home Living Family/patient expects to be discharged to:: Private residence Living Arrangements: Spouse/significant other Available Help at Discharge: Family;Available 24 hours/day Type of Home: House Home Access: Stairs to enter;Ramped entrance Entrance Stairs-Number of Steps: has a ramp, then up two more steps Entrance Stairs-Rails: None Home Layout: One level     Bathroom Shower/Tub: Occupational psychologist: Standard Bathroom Accessibility: Yes How Accessible: Accessible via wheelchair;Accessible via walker Home Equipment: Wheelchair - manual;Rollator (4 wheels);Shower seat;BSC/3in1   Additional Comments: Pt has [place] aide that helps with shower 2x/wk. Husband rolls her in wheelchair in home to get to bathroom and lift chair. She sleeps in lift chair. Was having HHPT, OT, RN prior to this admission      Prior Functioning/Environment Prior Level of Function : Needs assist       Physical Assist : Mobility (physical);ADLs (physical) Mobility (physical): Transfers   Mobility Comments: patient has not been able to ambulate since last admission. States she wears pull ups and stays in recliner. Was trying to work with PT/OT at home. Depends on husband for most of care.          OT Problem List: Decreased strength;Decreased activity tolerance;Decreased safety awareness;Impaired balance (sitting and/or standing);Decreased knowledge of use of DME or AE      OT Treatment/Interventions: Self-care/ADL training;Therapeutic exercise;Therapeutic activities;Energy conservation;DME and/or AE instruction;Balance training;Patient/family education    OT Goals(Current goals can be found in the care plan section) Acute Rehab OT Goals Patient Stated Goal: to go home OT Goal Formulation: With patient Time For Goal Achievement: 01/25/22 Potential to Achieve Goals: Fair ADL Goals Pt  Will Perform Grooming: sitting;with set-up;with supervision Pt Will Perform Lower Body Dressing: with mod assist;sitting/lateral leans Pt Will Transfer to Toilet: with mod assist;squat pivot transfer  OT Frequency: Min 2X/week    Co-evaluation              AM-PAC OT "6 Clicks" Daily Activity     Outcome Measure Help from another person eating meals?: None Help from another person taking care of personal grooming?: A Lot Help from another person toileting, which includes using toliet, bedpan, or urinal?: A Lot Help from another person bathing (including washing, rinsing, drying)?: A Lot Help from another person to put on and taking off regular upper body clothing?: A Lot Help from another person to put on and taking off regular lower body clothing?: A Lot 6 Click Score: 14   End of Session    Activity Tolerance: Patient tolerated treatment well Patient left: in bed;with call bell/phone within reach (PT in room)  OT Visit Diagnosis: Unsteadiness on feet (R26.81);Muscle weakness (generalized) (M62.81)                Time: 3532-9924 OT Time Calculation (min): 15 min Charges:  OT General Charges $OT Visit: 1 Visit OT Evaluation $OT Eval Moderate Complexity: 1 Mod  [name], M.S. OTR/L  01/11/22, 3:55 PM  ascom [phone]

## 2022-01-11 NOTE — TOC Progression Note (Addendum)
Transition of Care [facility]) - Progression Note    Patient Details  Name: [name] MRN: [id number] Date of Birth: [birth year]  Transition [facility]) CM/SW [place], LCSW Phone Number: 01/11/2022, 11:36 AM  Clinical Narrative:    Presented bed offers to patient via phone due to isolation. Patient has bed offers at [facility] and [facility]. Patient chose [facility]. CSW notified Ebony Hail at [facility] who stated they can take patient over weekend (Saturday or Sunday) pending auth and patient being medically ready. Ebony Hail is the contact over the weekend. Asked MD when patient will be medically ready.    12:32- Per MD, patient should be medically ready for SNF tomorrow. CSW started [place] portal.   1:20- Notified by Tammy at [facility] that they have made a bed offer for a private room. CSW spoke to patient who says she prefers [facility] as it is closer to home. CSW updated Ebony Hail at [facility] [employer].  CSW called [facility], spoke to [place] to change facility to [facility]. [facility] is already approved for starting 9/16.        Expected Discharge Plan and Services                                                 Social Determinants of Health (SDOH) Interventions    Readmission Risk Interventions    03/26/2021    4:21 PM 01/03/2020    3:37 PM 04/22/2019   10:50 AM  Readmission Risk Prevention Plan  Transportation Screening Complete Complete Complete  PCP or Specialist Appt within 3-5 Days Complete    Social Work Consult for [place] Planning/Counseling Complete    Palliative Care Screening Complete    Medication Review Press photographer) Complete Complete Referral to Pharmacy  PCP or Specialist appointment within 3-5 days of discharge  Complete Complete  HRI or Home Care Consult  Complete Complete  SW Recovery Care/Counseling Consult  Complete Complete  Palliative Care Screening  Not Applicable Not Applicable  Skilled Nursing Facility    Complete

## 2022-01-11 NOTE — Progress Notes (Signed)
Physical Therapy Treatment Patient Details Name: [name] MRN: [id number] DOB: 1950/08/06 Today's Date: 01/11/2022   History of Present Illness [name] is a 71 y.o. female with medical history significant for morbid obesity (BMI 47.30 kg/m), depression, anxiety, neuropathy, hypertension, atonic bladder status post chronic indwelling Foley catheter who was recently discharged from the hospital for management of a nonhealing left heel ulcer with suspected osteomyelitis.    PT Comments    Pt at EOB upon arrival with OT.  Pt attempted to perform bed mobility with therapist guidance but ultimately unable to do so correctly due to lack of strength and effort by pt.  Pt assisted with +2 max assist back into the bed where pt agreeable to perform bed-level exercises.  Pt then left with all needs met and call bell within reach.  Current discharge plans to SNF remain appropriate at this time.  Pt will continue to benefit from skilled therapy in order to address deficits listed below.      Recommendations for follow up therapy are one component of a multi-disciplinary discharge planning process, led by the attending physician.  Recommendations may be updated based on patient status, additional functional criteria and insurance authorization.  Follow Up Recommendations  Skilled nursing-short term rehab (<3 hours/day) Can patient physically be transported by private vehicle: No   Assistance Recommended at Discharge Frequent or constant Supervision/Assistance  Patient can return home with the following Two people to help with walking and/or transfers;Help with stairs or ramp for entrance;A lot of help with bathing/dressing/bathroom;Assist for transportation   Equipment Recommendations  None recommended by PT    Recommendations for Other Services       Precautions / Restrictions Precautions Precautions: Fall Restrictions Weight Bearing Restrictions: No Other Position/Activity  Restrictions: 2 weeks ago patient was here and was PWB in shoe for transfers only- no podiatry consult this admission yet.     Mobility  Bed Mobility Overal bed mobility: Needs Assistance Bed Mobility: Rolling, Supine to Sit, Sit to Supine Rolling: Mod assist   Supine to sit: Mod assist Sit to supine: Mod assist, +2 for physical assistance        Transfers                   General transfer comment: pt unable to achieve with +2, heavy posterior lean    Ambulation/Gait                   Stairs             Wheelchair Mobility    Modified Rankin (Stroke Patients Only)       Balance Overall balance assessment: Needs assistance Sitting-balance support: Feet supported Sitting balance-Leahy Scale: Poor   Postural control: Posterior lean                                  Cognition Arousal/Alertness: Awake/alert Behavior During Therapy: Flat affect Overall Cognitive Status: Within Functional Limits for tasks assessed                                 General Comments: difficulty sequencing transfers        Exercises      General Comments        Pertinent Vitals/Pain Pain Assessment Pain Assessment: No/denies pain    Home Living Family/patient expects to be  discharged to:: Private residence Living Arrangements: Spouse/significant other Available Help at Discharge: Family;Available 24 hours/day Type of Home: House Home Access: Stairs to enter;Ramped entrance Entrance Stairs-Rails: None Entrance Stairs-Number of Steps: has a ramp, then up two more steps   Home Layout: One level Home Equipment: IT sales professional (4 wheels);Shower seat;BSC/3in1 Additional Comments: Pt has [place] aide that helps with shower 2x/wk. Husband rolls her in wheelchair in home to get to bathroom and lift chair. She sleeps in lift chair. Was having HHPT, OT, RN prior to this admission    Prior Function            PT Goals  (current goals can now be found in the care plan section) Acute Rehab PT Goals Patient Stated Goal: to go to SNF then home PT Goal Formulation: With patient Time For Goal Achievement: 01/24/22 Potential to Achieve Goals: Good Progress towards PT goals: Progressing toward goals    Frequency    Min 2X/week      PT Plan Current plan remains appropriate    Co-evaluation              AM-PAC PT "6 Clicks" Mobility   Outcome Measure  Help needed turning from your back to your side while in a flat bed without using bedrails?: A Lot Help needed moving from lying on your back to sitting on the side of a flat bed without using bedrails?: A Lot Help needed moving to and from a bed to a chair (including a wheelchair)?: Total Help needed standing up from a chair using your arms (e.g., wheelchair or bedside chair)?: Total Help needed to walk in hospital room?: Total Help needed climbing 3-5 steps with a railing? : Total 6 Click Score: 8    End of Session Equipment Utilized During Treatment: Gait belt Activity Tolerance: Patient limited by fatigue Patient left: in bed;with call bell/phone within reach;with bed alarm set Nurse Communication: Mobility status PT Visit Diagnosis: Other abnormalities of gait and mobility (R26.89);Muscle weakness (generalized) (M62.81);Pain     Time: 9417-4081 PT Time Calculation (min) (ACUTE ONLY): 21 min  Charges:  $Therapeutic Activity: 8-22 mins                     Gwenlyn Saran, PT, DPT 01/11/22, 4:43 PM

## 2022-01-11 NOTE — Progress Notes (Signed)
Progress Note    [name]  GDJ:[id number] DOB: [birth year]  DOA: 01/09/2022 PCP: Kirk Ruths, MD      Brief Narrative:    Medical records reviewed and are as summarized below:  [name] is a 71 y.o. female with medical history significant for multiple sclerosis, 20 neurogenic bladder, CKD stage IIIa, peripheral vascular disease, morbid obesity, OSA, hyperlipidemia, COPD, recent hospitalization from 12/22/2021 through 12/26/2021 for management of nonhealing left ulcer and cellulitis of the left lower leg (bone biopsy was negative for osteomyelitis) and discharged on 2-week course of Augmentin.  She presented to the hospital again on 01/09/2022 because of increasing pain in the left foot and bloody drainage from the left heel ulcer.  Her home health nurse recommended that she come to the ED for further evaluation.  She was evaluated by Dr. Posey Pronto, podiatrist who further there was no need for surgical intervention.  However, he recommended 24 to 48 hours of antibiotics in the hospital.  He also recommended Betadine wet-to-dry dressing change and ongoing management at the wound care clinic.  Patient complained of generalized weakness and diarrhea.  Diarrhea was probably from recent use of antibiotics.   Assessment/Plan:   Principal Problem:   Foot ulcer ([place]) Active Problems:   Multiple sclerosis (HCC)   Essential hypertension   Obesity, Class III, BMI 40-49.9 (morbid obesity) (HCC)   CKD (chronic kidney disease) stage 3, GFR 30-59 ml/min (HCC)   Depression   Foley catheter in place on admission    Body mass index is 47.3 kg/m. (morbid obesity)   Left heel ulcer with anterior ankle erythema versus skin irritation from bandaging: She has been evaluated by Dr. Posey Pronto, podiatrist.  He recommended 24 to 48 hours of antibiotics while inpatient.  Start IV Unasyn.  Appreciate assistance from wound care nurse for local wound care. Of note, she was recently hospitalized  from 12/22/2021 through 12/26/2021 for management of nonhealing left heel ulcer with IV Unasyn followed by 2-week course of Augmentin.  Hypotension with history of hypertension: BP is on the low side.  She already got hydralazine, amlodipine and lisinopril today.  Hold afternoon and evening doses of hydralazine  Diarrhea: This may be because of recent use of antibiotics.  Continue to monitor.  Generalized weakness: PT and OT evaluation  Multiple sclerosis, neurogenic bladder: Continue indwelling Foley catheter  Depression and anxiety: Continue psychotropics and Klonopin  COPD: Compensated.  Continue bronchodilators  CKD stage IIIb: Creatinine is stable.   Diet Order             Diet 2 gram sodium Room service appropriate? Yes; Fluid consistency: Thin  Diet effective now                            Consultants: Podiatrist  Procedures: None    Medications:    atorvastatin  10 mg Oral QHS   buPROPion  300 mg Oral Daily   Chlorhexidine Gluconate Cloth  6 each Topical Daily   clonazePAM  0.5 mg Oral BID   DULoxetine  60 mg Oral Daily   enoxaparin (LOVENOX) injection  0.5 mg/kg Subcutaneous Q24H   gabapentin  600 mg Oral TID   [START ON 01/12/2022] hydrALAZINE  50 mg Oral TID   magnesium oxide  400 mg Oral Daily   mirabegron ER  50 mg Oral Daily   mometasone-formoterol  2 puff Inhalation BID   multivitamin with minerals  1 tablet Oral Daily   QUEtiapine Fumarate  150 mg Oral QHS   traZODone  50 mg Oral QHS   vortioxetine HBr  5 mg Oral Daily   Continuous Infusions:  ampicillin-sulbactam (UNASYN) IV 1.5 g (01/11/22 1037)     Anti-infectives (From admission, onward)    Start     Dose/Rate Route Frequency Ordered Stop   01/11/22 0900  ampicillin-sulbactam (UNASYN) 1.5 g in sodium chloride 0.9 % 100 mL IVPB        1.5 g 200 mL/hr over 30 Minutes Intravenous Every 6 hours 01/11/22 0821 01/13/22 0859              Family Communication/Anticipated  D/C date and plan/Code Status   DVT prophylaxis:      Code Status: Full Code  Family Communication: None Disposition Plan: Plan to discharge to SNF in 1 to 2 days   Status is: Observation The patient will require care spanning > 2 midnights and should be moved to inpatient because: Generalized weakness       Subjective:   c/o diarrhea and general weakness.  She could not tell how many times she has had diarrhea today.  Chart review shows she had 1 movement today. She also complains of pain in the left foot.  Objective:    Vitals:   01/10/22 2018 01/11/22 0412 01/11/22 0908 01/11/22 1026  BP: 109/88 (!) 99/53 (!) 99/57 (!) 114/98  Pulse: (!) 59 (!) 57 60   Resp: '18 16 18   '$ Temp: 97.8 F (36.6 C) 97.9 F (36.6 C) 98.1 F (36.7 C)   TempSrc:      SpO2: 96% 92% 94%   Weight:      Height:       No data found.   Intake/Output Summary (Last 24 hours) at 01/11/2022 1302 Last data filed at 01/11/2022 1039 Gross per 24 hour  Intake 236 ml  Output 700 ml  Net -464 ml   Filed Weights   01/09/22 1930  Weight: 125 kg    Exam:  GEN: NAD SKIN: Warm and dry EYES: No pallor or icterus ENT: MMM CV: RRR PULM: CTA B ABD: soft, obese, NT, +BS CNS: AAO x 3, non focal EXT: Left leg edema, no tenderness. Dressing on left foot wound is clean, dry and intact.    Data Reviewed:   I have personally reviewed following labs and imaging studies:  Labs: Labs show the following:   Basic Metabolic Panel: Recent Labs  Lab 01/09/22 1935 01/11/22 0418  NA 138 136  K 4.0 3.9  CL 104 103  CO2 25 23  GLUCOSE 95 127*  BUN 23 29*  CREATININE 1.28* 1.44*  CALCIUM 9.1 8.9   GFR Estimated Creatinine Clearance: 46.8 mL/min (A) (by C-G formula based on SCr of 1.44 mg/dL (H)). Liver Function Tests: No results for input(s): "AST", "ALT", "ALKPHOS", "BILITOT", "PROT", "ALBUMIN" in the last 168 hours. No results for input(s): "LIPASE", "AMYLASE" in the last 168 hours. No  results for input(s): "AMMONIA" in the last 168 hours. Coagulation profile No results for input(s): "INR", "PROTIME" in the last 168 hours.  CBC: Recent Labs  Lab 01/09/22 1935 01/11/22 0418  WBC 9.3 11.3*  HGB 11.7* 11.4*  HCT 38.8 35.7*  MCV 90.7 85.4  PLT 218 210   Cardiac Enzymes: No results for input(s): "CKTOTAL", "CKMB", "CKMBINDEX", "TROPONINI" in the last 168 hours. BNP (last 3 results) No results for input(s): "PROBNP" in the last 8760 hours. CBG: No results  for input(s): "GLUCAP" in the last 168 hours. D-Dimer: No results for input(s): "DDIMER" in the last 72 hours. Hgb A1c: No results for input(s): "HGBA1C" in the last 72 hours. Lipid Profile: No results for input(s): "CHOL", "HDL", "LDLCALC", "TRIG", "CHOLHDL", "LDLDIRECT" in the last 72 hours. Thyroid function studies: No results for input(s): "TSH", "T4TOTAL", "T3FREE", "THYROIDAB" in the last 72 hours.  Invalid input(s): "FREET3" Anemia work up: No results for input(s): "VITAMINB12", "FOLATE", "FERRITIN", "TIBC", "IRON", "RETICCTPCT" in the last 72 hours. Sepsis Labs: Recent Labs  Lab 01/09/22 1935 01/10/22 0604 01/11/22 0418  PROCALCITON <0.10 <0.10 <0.10  WBC 9.3  --  11.3*    Microbiology No results found for this or any previous visit (from the past 240 hour(s)).  Procedures and diagnostic studies:  DG Foot Complete Left  Result Date: 01/10/2022 CLINICAL DATA:  Pain, bruising in left foot EXAM: LEFT FOOT - COMPLETE 3+ VIEW COMPARISON:  None Available. FINDINGS: Plantar calcaneal spur. No acute bony abnormality. Specifically, no fracture, subluxation, or dislocation. Small linear density seen in the plantar soft tissues of unknown etiology. This could reflect a small foreign body or soft tissue calcification. IMPRESSION: No acute bony abnormality. Small linear density in the plantar soft tissues which could reflect a small foreign body or soft tissue calcification. Electronically Signed   By: Rolm Baptise M.D.   On: 01/10/2022 00:14               LOS: 0 days   [name]  [facility]   Pager on [email or web address]. If 7PM-7AM, please contact night-coverage at [email or web address]     01/11/2022, 1:02 PM

## 2022-01-12 DIAGNOSIS — S91302A Unspecified open wound, left foot, initial encounter: Secondary | ICD-10-CM | POA: Diagnosis not present

## 2022-01-12 DIAGNOSIS — J449 Chronic obstructive pulmonary disease, unspecified: Secondary | ICD-10-CM | POA: Diagnosis not present

## 2022-01-12 DIAGNOSIS — Z0389 Encounter for observation for other suspected diseases and conditions ruled out: Secondary | ICD-10-CM | POA: Diagnosis not present

## 2022-01-12 DIAGNOSIS — L97529 Non-pressure chronic ulcer of other part of left foot with unspecified severity: Secondary | ICD-10-CM | POA: Diagnosis not present

## 2022-01-12 DIAGNOSIS — N1831 Chronic kidney disease, stage 3a: Secondary | ICD-10-CM | POA: Diagnosis not present

## 2022-01-12 DIAGNOSIS — Z872 Personal history of diseases of the skin and subcutaneous tissue: Secondary | ICD-10-CM | POA: Diagnosis not present

## 2022-01-12 DIAGNOSIS — G35 Multiple sclerosis: Secondary | ICD-10-CM | POA: Diagnosis not present

## 2022-01-12 DIAGNOSIS — L89624 Pressure ulcer of left heel, stage 4: Secondary | ICD-10-CM | POA: Diagnosis not present

## 2022-01-12 DIAGNOSIS — Z978 Presence of other specified devices: Secondary | ICD-10-CM

## 2022-01-12 DIAGNOSIS — I959 Hypotension, unspecified: Secondary | ICD-10-CM | POA: Diagnosis not present

## 2022-01-12 DIAGNOSIS — Z20822 Contact with and (suspected) exposure to covid-19: Secondary | ICD-10-CM | POA: Diagnosis not present

## 2022-01-12 DIAGNOSIS — R531 Weakness: Secondary | ICD-10-CM | POA: Diagnosis not present

## 2022-01-12 DIAGNOSIS — M86172 Other acute osteomyelitis, left ankle and foot: Secondary | ICD-10-CM | POA: Diagnosis not present

## 2022-01-12 DIAGNOSIS — A419 Sepsis, unspecified organism: Secondary | ICD-10-CM | POA: Diagnosis not present

## 2022-01-12 DIAGNOSIS — I10 Essential (primary) hypertension: Secondary | ICD-10-CM | POA: Diagnosis not present

## 2022-01-12 DIAGNOSIS — Z7401 Bed confinement status: Secondary | ICD-10-CM | POA: Diagnosis not present

## 2022-01-12 DIAGNOSIS — L03116 Cellulitis of left lower limb: Secondary | ICD-10-CM | POA: Diagnosis not present

## 2022-01-12 DIAGNOSIS — M86672 Other chronic osteomyelitis, left ankle and foot: Secondary | ICD-10-CM | POA: Diagnosis not present

## 2022-01-12 DIAGNOSIS — B999 Unspecified infectious disease: Secondary | ICD-10-CM | POA: Diagnosis not present

## 2022-01-12 DIAGNOSIS — N189 Chronic kidney disease, unspecified: Secondary | ICD-10-CM | POA: Diagnosis not present

## 2022-01-12 DIAGNOSIS — R600 Localized edema: Secondary | ICD-10-CM | POA: Diagnosis not present

## 2022-01-12 LAB — CBC WITH DIFFERENTIAL/PLATELET
Abs Immature Granulocytes: 0.03 10*3/uL (ref 0.00–0.07)
Basophils Absolute: 0.1 10*3/uL (ref 0.0–0.1)
Basophils Relative: 1 %
Eosinophils Absolute: 0.3 10*3/uL (ref 0.0–0.5)
Eosinophils Relative: 4 %
HCT: 30.7 % — ABNORMAL LOW (ref 36.0–46.0)
Hemoglobin: 9.9 g/dL — ABNORMAL LOW (ref 12.0–15.0)
Immature Granulocytes: 0 %
Lymphocytes Relative: 10 %
Lymphs Abs: 0.8 10*3/uL (ref 0.7–4.0)
MCH: 27.7 pg (ref 26.0–34.0)
MCHC: 32.2 g/dL (ref 30.0–36.0)
MCV: 85.8 fL (ref 80.0–100.0)
Monocytes Absolute: 0.7 10*3/uL (ref 0.1–1.0)
Monocytes Relative: 9 %
Neutro Abs: 5.4 10*3/uL (ref 1.7–7.7)
Neutrophils Relative %: 76 %
Platelets: 172 10*3/uL (ref 150–400)
RBC: 3.58 MIL/uL — ABNORMAL LOW (ref 3.87–5.11)
RDW: 14.2 % (ref 11.5–15.5)
WBC: 7.2 10*3/uL (ref 4.0–10.5)
nRBC: 0 % (ref 0.0–0.2)

## 2022-01-12 LAB — BASIC METABOLIC PANEL
Anion gap: 7 (ref 5–15)
BUN: 33 mg/dL — ABNORMAL HIGH (ref 8–23)
CO2: 23 mmol/L (ref 22–32)
Calcium: 8.7 mg/dL — ABNORMAL LOW (ref 8.9–10.3)
Chloride: 104 mmol/L (ref 98–111)
Creatinine, Ser: 1.47 mg/dL — ABNORMAL HIGH (ref 0.44–1.00)
GFR, Estimated: 38 mL/min — ABNORMAL LOW (ref >60)
Glucose, Bld: 97 mg/dL (ref 70–99)
Potassium: 3.8 mmol/L (ref 3.5–5.1)
Sodium: 134 mmol/L — ABNORMAL LOW (ref 135–145)

## 2022-01-12 LAB — MAGNESIUM: Magnesium: 2.1 mg/dL (ref 1.7–2.4)

## 2022-01-12 LAB — MRSA NEXT GEN BY PCR, NASAL: MRSA by PCR Next Gen: NOT DETECTED

## 2022-01-12 MED ORDER — CLONAZEPAM 0.5 MG PO TABS: 0.5000 mg | ORAL_TABLET | Freq: Two times a day (BID) | ORAL | 0 refills | Status: DC

## 2022-01-12 NOTE — TOC Transition Note (Signed)
Transition of Care [facility]) - CM/SW Discharge Note   Patient Details  Name: [name] MRN: [id number] Date of Birth: 1950/09/17  Transition of [facility]) CM/SW Contact:  [address], [place] Phone Number: 01/12/2022, 2:20 PM   Clinical Narrative:     Patient will DC to: [facility]  Anticipated DC date: 01/12/22 Family notified: none-pt doesn't remember her husband's phone number Transport by: [facility]   Per MD patient ready for DC to [facility]. RN to call report prior to discharge  [phone]. RN, patient, patient's family, and facility notified of DC. Discharge Summary and FL2 sent to facility. DC packet on chart. Ambulance transport requested for patient.   CSW will sign off for now as social work intervention is no longer needed. Please consult [place] again if new needs arise.          Patient Goals and CMS Choice        Discharge Placement                       Discharge Plan and Services                                     Social Determinants of Health (SDOH) Interventions     Readmission Risk Interventions    03/26/2021    4:21 PM 01/03/2020    3:37 PM 04/22/2019   10:50 AM  Readmission Risk Prevention Plan  Transportation Screening Complete Complete Complete  PCP or Specialist Appt within 3-5 Days Complete    Social Work Consult for [place] Planning/Counseling Complete    Palliative Care Screening Complete    Medication Review [employer]) Complete Complete Referral to Pharmacy  PCP or Specialist appointment within 3-5 days of discharge  Complete Complete  HRI or Home Care Consult  Complete Complete  SW Recovery Care/Counseling Consult  Complete Complete  Palliative Care Screening  Not Applicable Not Applicable  Skilled Nursing Facility   Complete

## 2022-01-12 NOTE — Discharge Summary (Addendum)
Physician Discharge Summary   Patient: [name] MRN: [id number] DOB: 09-12-1950  Admit date:     01/09/2022  Discharge date: 01/12/22  Discharge Physician: Jennye Boroughs   PCP: Kirk Ruths, MD   Recommendations at discharge:   Follow-up with physician in at the nursing home within 3 days of discharge Follow-up at the wound care clinic for regular wound care.  Discharge Diagnoses: Principal Problem:   Foot ulcer ([place]) Active Problems:   Multiple sclerosis ([place])   Essential hypertension   Obesity, Class III, BMI 40-49.9 (morbid obesity) (HCC)   CKD (chronic kidney disease) stage 3, GFR 30-59 ml/min (HCC)   Depression   Foley catheter in place on admission  Resolved Problems:   * No resolved hospital problems. *  Hospital Course:  [name] is a 71 y.o. female with medical history significant for multiple sclerosis, 20 neurogenic bladder, CKD stage IIIa, peripheral vascular disease, morbid obesity, OSA, hyperlipidemia, COPD, recent hospitalization from 12/22/2021 through 12/26/2021 for management of nonhealing left ulcer and cellulitis of the left lower leg (bone biopsy was negative for osteomyelitis) and discharged on 2-week course of Augmentin.  She presented to the hospital again on 01/09/2022 because of increasing pain in the left foot and bloody drainage from the left heel ulcer.  Her home health nurse recommended that she come to the ED for further evaluation.   She was evaluated by Dr. Posey Pronto, podiatrist who further there was no need for surgical intervention.  However, he recommended 24 to 48 hours of antibiotics in the hospital.  He also recommended Betadine wet-to-dry dressing change and ongoing management at the wound care clinic.   Patient complained of generalized weakness and diarrhea. Diarrhea was probably from recent use of antibiotics.  Diarrhea has resolved.  She was evaluated by PT and OT who recommended further rehabilitation at SNF her condition is  improved and she is deemed stable for discharge to SNF today.     Consultants: Podiatrist Procedures performed: None  Disposition: Skilled nursing facility Diet recommendation:  Discharge Diet Orders (From admission, onward)     Start     Ordered   01/12/22 0000  Diet - low sodium heart healthy        01/12/22 1335           Cardiac diet DISCHARGE MEDICATION: Allergies as of 01/12/2022   No Known Allergies      Medication List     STOP taking these medications    amoxicillin-clavulanate 875-125 MG tablet Commonly known as: AUGMENTIN       TAKE these medications    acetaminophen 500 MG tablet Commonly known as: TYLENOL Take 1,000 mg by mouth every 8 (eight) hours as needed for moderate pain.   amLODipine 5 MG tablet Commonly known as: NORVASC Take 5 mg by mouth daily.   atorvastatin 10 MG tablet Commonly known as: LIPITOR Take 10 mg by mouth at bedtime.   budesonide-formoterol 160-4.5 MCG/ACT inhaler Commonly known as: SYMBICORT Inhale 2 puffs into the lungs 2 (two) times daily.   buPROPion 300 MG 24 hr tablet Commonly known as: WELLBUTRIN XL Take 300 mg by mouth daily.   clonazePAM 0.5 MG tablet Commonly known as: KLONOPIN Take 1 tablet (0.5 mg total) by mouth 2 (two) times daily.   docusate sodium 100 MG capsule Commonly known as: COLACE Take 1 capsule (100 mg total) by mouth 2 (two) times daily as needed.   DULoxetine 60 MG capsule Commonly known as: CYMBALTA Take  1 capsule (60 mg total) by mouth every morning. What changed: when to take this   gabapentin 600 MG tablet Commonly known as: NEURONTIN Take 600 mg by mouth 3 (three) times daily.   hydrALAZINE 50 MG tablet Commonly known as: APRESOLINE Take 50 mg by mouth 3 (three) times daily.   lisinopril 20 MG tablet Commonly known as: ZESTRIL Take 1 tablet (20 mg total) by mouth daily.   magnesium oxide 400 MG tablet Commonly known as: MAG-OX Take 400 mg by mouth daily.    multivitamin with minerals Tabs tablet Take 1 tablet by mouth daily.   Myrbetriq 50 MG Tb24 tablet Generic drug: mirabegron ER TAKE ONE TABLET BY MOUTH ONCE DAILY   QUEtiapine Fumarate 150 MG 24 hr tablet Commonly known as: SEROQUEL XR Take 150 mg by mouth at bedtime.   torsemide 20 MG tablet Commonly known as: DEMADEX Take 1 tablet (20 mg total) by mouth daily as needed (edema).   traZODone 50 MG tablet Commonly known as: DESYREL Take 50 mg by mouth at bedtime.   vortioxetine HBr 5 MG Tabs tablet Commonly known as: TRINTELLIX Take 5 mg by mouth daily.               Discharge Care Instructions  (From admission, onward)           Start     Ordered   01/12/22 0000  Discharge wound care:       Comments: Cleanse wound to left heel with normal saline and pat dry.Betadine wet-to-dry dressing change.   Apply Aquacel to wound bed.  Cover with dry gauze and Kerlix.  Secure with tape.  Change daily.   01/12/22 1335            Contact information for after-discharge care     [facility] .   Service: Skilled Nursing Contact information: [address] [place] [phone]                    Discharge Exam: Danley Danker Weights   01/09/22 1930  Weight: 125 kg   GEN: NAD SKIN: Warm and dry EYES: No pallor or icterus ENT: MMM CV: RRR PULM: CTA B ABD: soft, obese, NT, +BS CNS: AAO x 3, non focal GU: Foley catheter draining amber urine EXT: Left leg edema.  No tenderness.  Erythematous area on the anterior aspect of the left ankle.  Left heel ulcer        Condition at discharge: good  The results of significant diagnostics from this hospitalization (including imaging, microbiology, ancillary and laboratory) are listed below for reference.   Imaging Studies: DG Foot Complete Left  Result Date: 01/10/2022 CLINICAL DATA:  Pain, bruising in left foot EXAM: LEFT  FOOT - COMPLETE 3+ VIEW COMPARISON:  None Available. FINDINGS: Plantar calcaneal spur. No acute bony abnormality. Specifically, no fracture, subluxation, or dislocation. Small linear density seen in the plantar soft tissues of unknown etiology. This could reflect a small foreign body or soft tissue calcification. IMPRESSION: No acute bony abnormality. Small linear density in the plantar soft tissues which could reflect a small foreign body or soft tissue calcification. Electronically Signed   By: Rolm Baptise M.D.   On: 01/10/2022 00:14   MR HEEL LEFT WO CONTRAST  Result Date: 12/24/2021 CLINICAL DATA:  Osteomyelitis, foot To rule out osteo EXAM: MR OF THE LEFT HEEL WITHOUT CONTRAST TECHNIQUE: Multiplanar, multisequence MR imaging of the  left hip was performed. No intravenous contrast was administered. COMPARISON:  Left foot radiograph 12/22/2021 FINDINGS: Suboptimal exam due to motion artifact. Bones/Joint/Cartilage There is minimal marrow edema and subcortical low T1 signal within the posterior lateral calcaneus (series 9, image 11, series 4 image 14). Ligaments Poorly evaluated due to motion artifact. Muscles and Tendons Mild insertional tendinosis of the Achilles tendon. Mild reactive tenosynovitis of the posterior tibial tendon. Soft tissues Diffuse ankle and foot soft tissue swelling. More focal confluent soft tissue swelling along the dorsal midfoot, partially visualized (series 4, images 18-20). There is an ulcer along the posterolateral heel. IMPRESSION: Suboptimal exam due to motion artifact. Soft tissue ulcer along the posterolateral heel with mild underlying subcortical marrow signal abnormality in the adjacent calcaneus, favored to represent early osteomyelitis. Extensive soft tissue swelling of the foot ankle as can be seen in cellulitis or lymphedema, with more confluent edema/fluid collecting along the partially visualized dorsal midfoot. Electronically Signed   By: Maurine Simmering M.D.   On:  12/24/2021 08:23   US ARTERIAL ABI (SCREENING LOWER EXTREMITY)  Result Date: 12/22/2021 CLINICAL DATA:  Nonhealing left heel wound, smoker, hypertension EXAM: NONINVASIVE PHYSIOLOGIC VASCULAR STUDY OF BILATERAL LOWER EXTREMITIES TECHNIQUE: Evaluation of both lower extremities were performed at rest, including calculation of ankle-brachial indices with single level Doppler, pressure and pulse volume recording. COMPARISON:  None Available. FINDINGS: Right ABI:  1.17 Left ABI:  1.04 Right Lower Extremity:  Biphasic arterial waveforms at the ankle. Left Lower Extremity:  Biphasic arterial waveforms at the ankle. 1.0-1.4 Normal IMPRESSION: Normal resting ABIs. Electronically Signed   By: Jerilynn Mages.  Shick M.D.   On: 12/22/2021 18:46   DG Foot Complete Left  Result Date: 12/22/2021 CLINICAL DATA:  Left heel wound for 2 years, now black. EXAM: LEFT FOOT - COMPLETE 3+ VIEW COMPARISON:  03/23/2021 ankle radiograph FINDINGS: Posterior heel ulcer. No evidence of osteomyelitis. No opaque foreign body. Generalized osteopenia.  No acute fracture or erosion. IMPRESSION: Heel ulcer without acute osseous finding or opaque foreign body. Electronically Signed   By: Jorje Guild M.D.   On: 12/22/2021 10:43    Microbiology: Results for orders placed or performed during the hospital encounter of 01/09/22  MRSA Next Gen by PCR, Nasal     Status: None   Collection Time: 01/12/22  9:50 AM  Result Value Ref Range Status   MRSA by PCR Next Gen NOT DETECTED NOT DETECTED Final    Comment: (NOTE) The GeneXpert MRSA Assay (FDA approved for NASAL specimens only), is one component of a comprehensive MRSA colonization surveillance program. It is not intended to diagnose MRSA infection nor to guide or monitor treatment for MRSA infections. Test performance is not FDA approved in patients less than 60 years old. Performed at [facility], [address] [place], [place] [address]     Labs: CBC: Recent Labs  Lab  01/09/22 1935 01/11/22 0418 01/12/22 0518  WBC 9.3 11.3* 7.2  NEUTROABS  --   --  5.4  HGB 11.7* 11.4* 9.9*  HCT 38.8 35.7* 30.7*  MCV 90.7 85.4 85.8  PLT 218 210 947   Basic Metabolic Panel: Recent Labs  Lab 01/09/22 1935 01/11/22 0418 01/12/22 0518  NA 138 136 134*  K 4.0 3.9 3.8  CL 104 103 104  CO2 '25 23 23  '$ GLUCOSE 95 127* 97  BUN 23 29* 33*  CREATININE 1.28* 1.44* 1.47*  CALCIUM 9.1 8.9 8.7*  MG  --   --  2.1  Liver Function Tests: No results for input(s): "AST", "ALT", "ALKPHOS", "BILITOT", "PROT", "ALBUMIN" in the last 168 hours. CBG: No results for input(s): "GLUCAP" in the last 168 hours.  Discharge time spent: greater than 30 minutes.  Signed: Jennye Boroughs, MD [facility] 01/12/2022

## 2022-01-12 NOTE — Progress Notes (Signed)
Pt to be d/c to [facility], Report called to [employer], awaiting transportation.

## 2022-01-12 NOTE — Progress Notes (Signed)
[name] to be D/C'd Nursing Home [facility] per MD order.  Report called to Dinna at [facility].  Allergies as of 01/12/2022   No Known Allergies      Medication List     STOP taking these medications    amoxicillin-clavulanate 875-125 MG tablet Commonly known as: AUGMENTIN       TAKE these medications    acetaminophen 500 MG tablet Commonly known as: TYLENOL Take 1,000 mg by mouth every 8 (eight) hours as needed for moderate pain.   amLODipine 5 MG tablet Commonly known as: NORVASC Take 5 mg by mouth daily.   atorvastatin 10 MG tablet Commonly known as: LIPITOR Take 10 mg by mouth at bedtime.   budesonide-formoterol 160-4.5 MCG/ACT inhaler Commonly known as: SYMBICORT Inhale 2 puffs into the lungs 2 (two) times daily.   buPROPion 300 MG 24 hr tablet Commonly known as: WELLBUTRIN XL Take 300 mg by mouth daily.   clonazePAM 0.5 MG tablet Commonly known as: KLONOPIN Take 1 tablet (0.5 mg total) by mouth 2 (two) times daily.   docusate sodium 100 MG capsule Commonly known as: COLACE Take 1 capsule (100 mg total) by mouth 2 (two) times daily as needed.   DULoxetine 60 MG capsule Commonly known as: CYMBALTA Take 1 capsule (60 mg total) by mouth every morning. What changed: when to take this   gabapentin 600 MG tablet Commonly known as: NEURONTIN Take 600 mg by mouth 3 (three) times daily.   hydrALAZINE 50 MG tablet Commonly known as: APRESOLINE Take 50 mg by mouth 3 (three) times daily.   lisinopril 20 MG tablet Commonly known as: ZESTRIL Take 1 tablet (20 mg total) by mouth daily.   magnesium oxide 400 MG tablet Commonly known as: MAG-OX Take 400 mg by mouth daily.   multivitamin with minerals Tabs tablet Take 1 tablet by mouth daily.   Myrbetriq 50 MG Tb24 tablet Generic drug: mirabegron ER TAKE ONE TABLET BY MOUTH ONCE DAILY   QUEtiapine Fumarate 150 MG 24 hr tablet Commonly known as: SEROQUEL XR Take 150 mg by mouth at bedtime.    torsemide 20 MG tablet Commonly known as: DEMADEX Take 1 tablet (20 mg total) by mouth daily as needed (edema).   traZODone 50 MG tablet Commonly known as: DESYREL Take 50 mg by mouth at bedtime.   vortioxetine HBr 5 MG Tabs tablet Commonly known as: TRINTELLIX Take 5 mg by mouth daily.               Discharge Care Instructions  (From admission, onward)           Start     Ordered   01/12/22 0000  Discharge wound care:       Comments: Cleanse wound to left heel with normal saline and pat dry.Betadine wet-to-dry dressing change.   Apply Aquacel to wound bed.  Cover with dry gauze and Kerlix.  Secure with tape.  Change daily.   01/12/22 1335            Vitals:   01/12/22 0442 01/12/22 0833  BP: (!) 116/46 95/72  Pulse: 75 75  Resp: 20 18  Temp: 98 F (36.7 C) 98.5 F (36.9 C)  SpO2: 93% 94%   Skin clean, dry and intact without evidence of skin break down, no evidence of skin tears noted. IV catheter discontinued intact. Site without signs and symptoms of complications. Dressing and pressure applied. Pt denies pain at this time. No complaints noted.  An  After Visit Summary was printed and sent with pt. Patient escorted via stretcher, and D/C to [facility] via EMS  Rolley Sims

## 2022-01-12 NOTE — Plan of Care (Signed)

## 2022-01-14 ENCOUNTER — Telehealth: Payer: Self-pay | Admitting: *Deleted

## 2022-01-14 DIAGNOSIS — J449 Chronic obstructive pulmonary disease, unspecified: Secondary | ICD-10-CM | POA: Diagnosis not present

## 2022-01-14 DIAGNOSIS — G35 Multiple sclerosis: Secondary | ICD-10-CM | POA: Diagnosis not present

## 2022-01-14 DIAGNOSIS — N1831 Chronic kidney disease, stage 3a: Secondary | ICD-10-CM | POA: Diagnosis not present

## 2022-01-14 DIAGNOSIS — L89624 Pressure ulcer of left heel, stage 4: Secondary | ICD-10-CM | POA: Diagnosis not present

## 2022-01-14 NOTE — Telephone Encounter (Signed)
Per husband, patient was released from [facility] and placed with [facility] on the 15 th.

## 2022-01-15 ENCOUNTER — Telehealth: Payer: Self-pay | Admitting: Podiatry

## 2022-01-15 NOTE — Telephone Encounter (Signed)
Patient scheduled 01/29/22

## 2022-01-15 NOTE — Telephone Encounter (Signed)
Teka from [facility] called and LM on after hours VM that they faxed over 3 orders for home health to be signed by Dr. Amalia Hailey. I called and spoke with Sammie Bench and she states she just needs those orders signed and faxed back to them. Her fax # is [phone] and her direct line is [phone].

## 2022-01-16 ENCOUNTER — Telehealth: Payer: Self-pay | Admitting: Nurse Practitioner

## 2022-01-16 NOTE — Telephone Encounter (Signed)
Attempted to contact patient to reschedule the Palliative Consult from 8/30 (due to patient was in the hospital at that time), left VM requesting a call back by Friday 01/18/22 to let me know if she wanted to reschedule or if I needed to cancel the referral.

## 2022-01-16 NOTE — Telephone Encounter (Signed)
Fax was received, signed, and returned.  Thanks, Dr. Amalia Hailey

## 2022-01-16 NOTE — Telephone Encounter (Signed)
Patient has appt 02/18/2022 with [name] at [facility].

## 2022-01-16 NOTE — Telephone Encounter (Signed)
Left message for patient to call back for home health on her heel ulcer.

## 2022-01-16 NOTE — Telephone Encounter (Signed)
Called,no answer, left message for [place] , [place] with [facility], verbal orders approved by physician.

## 2022-01-17 ENCOUNTER — Emergency Department
Admission: EM | Admit: 2022-01-17 | Discharge: 2022-01-18 | Disposition: A | Discharge status: Skilled Nursing Facility | Payer: Medicare HMO | Attending: Emergency Medicine | Admitting: Emergency Medicine

## 2022-01-17 ENCOUNTER — Emergency Department: Payer: Medicare HMO

## 2022-01-17 ENCOUNTER — Other Ambulatory Visit: Payer: Self-pay

## 2022-01-17 DIAGNOSIS — S91302A Unspecified open wound, left foot, initial encounter: Secondary | ICD-10-CM | POA: Diagnosis not present

## 2022-01-17 DIAGNOSIS — N189 Chronic kidney disease, unspecified: Secondary | ICD-10-CM | POA: Diagnosis not present

## 2022-01-17 DIAGNOSIS — Z872 Personal history of diseases of the skin and subcutaneous tissue: Secondary | ICD-10-CM | POA: Diagnosis not present

## 2022-01-17 DIAGNOSIS — R531 Weakness: Secondary | ICD-10-CM | POA: Diagnosis not present

## 2022-01-17 DIAGNOSIS — L03116 Cellulitis of left lower limb: Secondary | ICD-10-CM | POA: Diagnosis not present

## 2022-01-17 DIAGNOSIS — B999 Unspecified infectious disease: Secondary | ICD-10-CM | POA: Diagnosis not present

## 2022-01-17 DIAGNOSIS — J449 Chronic obstructive pulmonary disease, unspecified: Secondary | ICD-10-CM | POA: Insufficient documentation

## 2022-01-17 DIAGNOSIS — M86672 Other chronic osteomyelitis, left ankle and foot: Secondary | ICD-10-CM | POA: Diagnosis not present

## 2022-01-17 DIAGNOSIS — Z0389 Encounter for observation for other suspected diseases and conditions ruled out: Secondary | ICD-10-CM | POA: Diagnosis not present

## 2022-01-17 DIAGNOSIS — L89624 Pressure ulcer of left heel, stage 4: Secondary | ICD-10-CM | POA: Diagnosis not present

## 2022-01-17 DIAGNOSIS — Z20822 Contact with and (suspected) exposure to covid-19: Secondary | ICD-10-CM | POA: Insufficient documentation

## 2022-01-17 DIAGNOSIS — R600 Localized edema: Secondary | ICD-10-CM | POA: Diagnosis not present

## 2022-01-17 DIAGNOSIS — I959 Hypotension, unspecified: Secondary | ICD-10-CM | POA: Diagnosis not present

## 2022-01-17 DIAGNOSIS — Z7401 Bed confinement status: Secondary | ICD-10-CM | POA: Diagnosis not present

## 2022-01-17 LAB — URINALYSIS, ROUTINE W REFLEX MICROSCOPIC
Bilirubin Urine: NEGATIVE
Glucose, UA: NEGATIVE mg/dL
Hgb urine dipstick: NEGATIVE
Nitrite: NEGATIVE
Protein, ur: 30 mg/dL — AB
Specific Gravity, Urine: 1.025 (ref 1.005–1.030)
pH: 5 (ref 5.0–8.0)

## 2022-01-17 LAB — URINALYSIS, MICROSCOPIC (REFLEX): WBC, UA: >50 WBC/hpf (ref 0–5)

## 2022-01-17 LAB — COMPREHENSIVE METABOLIC PANEL
ALT: 15 U/L (ref 0–44)
AST: 17 U/L (ref 15–41)
Albumin: 3.6 g/dL (ref 3.5–5.0)
Alkaline Phosphatase: 66 U/L (ref 38–126)
Anion gap: 12 (ref 5–15)
BUN: 47 mg/dL — ABNORMAL HIGH (ref 8–23)
CO2: 23 mmol/L (ref 22–32)
Calcium: 9.1 mg/dL (ref 8.9–10.3)
Chloride: 96 mmol/L — ABNORMAL LOW (ref 98–111)
Creatinine, Ser: 1.91 mg/dL — ABNORMAL HIGH (ref 0.44–1.00)
GFR, Estimated: 28 mL/min — ABNORMAL LOW (ref >60)
Glucose, Bld: 105 mg/dL — ABNORMAL HIGH (ref 70–99)
Potassium: 4.5 mmol/L (ref 3.5–5.1)
Sodium: 131 mmol/L — ABNORMAL LOW (ref 135–145)
Total Bilirubin: 0.6 mg/dL (ref 0.3–1.2)
Total Protein: 7 g/dL (ref 6.5–8.1)

## 2022-01-17 LAB — CBC WITH DIFFERENTIAL/PLATELET
Abs Immature Granulocytes: 0.05 10*3/uL (ref 0.00–0.07)
Basophils Absolute: 0.1 10*3/uL (ref 0.0–0.1)
Basophils Relative: 1 %
Eosinophils Absolute: 0.4 10*3/uL (ref 0.0–0.5)
Eosinophils Relative: 3 %
HCT: 32.7 % — ABNORMAL LOW (ref 36.0–46.0)
Hemoglobin: 10.4 g/dL — ABNORMAL LOW (ref 12.0–15.0)
Immature Granulocytes: 0 %
Lymphocytes Relative: 5 %
Lymphs Abs: 0.6 10*3/uL — ABNORMAL LOW (ref 0.7–4.0)
MCH: 27.5 pg (ref 26.0–34.0)
MCHC: 31.8 g/dL (ref 30.0–36.0)
MCV: 86.5 fL (ref 80.0–100.0)
Monocytes Absolute: 0.9 10*3/uL (ref 0.1–1.0)
Monocytes Relative: 8 %
Neutro Abs: 9.9 10*3/uL — ABNORMAL HIGH (ref 1.7–7.7)
Neutrophils Relative %: 83 %
Platelets: 257 10*3/uL (ref 150–400)
RBC: 3.78 MIL/uL — ABNORMAL LOW (ref 3.87–5.11)
RDW: 14.1 % (ref 11.5–15.5)
WBC: 11.9 10*3/uL — ABNORMAL HIGH (ref 4.0–10.5)
nRBC: 0 % (ref 0.0–0.2)

## 2022-01-17 LAB — RESP PANEL BY RT-PCR (FLU A&B, COVID) ARPGX2
Influenza A by PCR: NEGATIVE
Influenza B by PCR: NEGATIVE
SARS Coronavirus 2 by RT PCR: NEGATIVE

## 2022-01-17 LAB — PROTIME-INR
INR: 1.2 (ref 0.8–1.2)
Prothrombin Time: 14.7 seconds (ref 11.4–15.2)

## 2022-01-17 LAB — LACTIC ACID, PLASMA: Lactic Acid, Venous: 1.1 mmol/L (ref 0.5–1.9)

## 2022-01-17 MED ORDER — SULFAMETHOXAZOLE-TRIMETHOPRIM 800-160 MG PO TABS: 1.0000 | ORAL_TABLET | Freq: Two times a day (BID) | ORAL | 0 refills | Status: DC

## 2022-01-17 MED ORDER — CEPHALEXIN 500 MG PO CAPS: 500.0000 mg | ORAL_CAPSULE | Freq: Four times a day (QID) | ORAL | 0 refills | Status: DC

## 2022-01-17 NOTE — ED Notes (Signed)
L foot and heel wrapped per request

## 2022-01-17 NOTE — ED Notes (Signed)
Patient currently in MRI.

## 2022-01-17 NOTE — ED Triage Notes (Signed)
Pt via [facility] from [facility] c/o left foot wound evaluated by physician earlier who noted that she may need amputation and could be septic. Wound has foul odor and discharge. Pt reports 6/10 pain.   T 98.4 HR 60 O2 99% on room air RR 18 BP 132/84  Pt a/o x 4.

## 2022-01-17 NOTE — Telephone Encounter (Signed)
Has a wound care appointment with [name] [facility]per epic notes on 02/18/22

## 2022-01-17 NOTE — ED Notes (Signed)
Pt has multiple wounds to left foot, dressed today at Dr office. Necrotic wound noted to left heel with small amount of foul-smelling drainage noted. Large, palm-sized wound noted to dorsal surface of left foot/anterior ankle with moderate amount of purulent drainage and erythematous borders. Pt denies diabetes and states that she has MS.   Pt also has chronic indwelling foley catheter due to neurogenic bladder secondary to MS dx. Sample obtained.

## 2022-01-17 NOTE — ED Notes (Signed)
Pt has sutures to left heel, along with "skin tear wound" on top of foot. Foot is elevated in booty. + pulses. Pt with no needs at this time.

## 2022-01-17 NOTE — ED Provider Notes (Signed)
[facility] Provider Note   Event Date/Time   First MD Initiated Contact with Patient 01/17/22 1802     (approximate) History  Wound Infection  HPI [name] is a 71 y.o. female with a past medical history of CKD, COPD, morbid obesity, and chronic left foot wound who presents for evaluation of this foot wounds at the behest of her primary care physician who noted that he was concerned for worsening surrounding erythema, foul odor and discharge, and signs of sepsis at [facility] where she came via EMS.  Patient is a poor historian as to the timeline of interventions on this foot but states that she was recently discharged from the hospital for this foot ulcer and has not been on antibiotics since her discharge. ROS: Patient currently denies any vision changes, tinnitus, difficulty speaking, facial droop, sore throat, chest pain, shortness of breath, abdominal pain, nausea/vomiting/diarrhea, dysuria, or weakness/numbness/paresthesias in any extremity   Physical Exam  Triage Vital Signs: ED Triage Vitals  Enc Vitals Group     BP 01/17/22 1713 (!) 129/54     Pulse Rate 01/17/22 1713 (!) 135     Resp 01/17/22 1713 20     Temp 01/17/22 1713 98.9 F (37.2 C)     Temp Source 01/17/22 1713 Oral     SpO2 01/17/22 1713 95 %     Weight 01/17/22 1714 250 lb (113.4 kg)     Height 01/17/22 1714 '5\' 4"'$  (1.626 m)     Head Circumference --      [place] --      Pain Score 01/17/22 1701 6     Pain Loc --      Pain Edu? --      Excl. in [place]? --    Most recent vital signs: Vitals:   01/17/22 2300 01/17/22 2330  BP: (!) 109/50 (!) 121/48  Pulse: (!) 48 (!) 48  Resp: 16 16  Temp:  98.2 F (36.8 C)  SpO2: 96% 96%   General: Awake, oriented x4. CV:  Good peripheral perfusion.  Resp:  Normal effort.  Abd:  No distention.  Other:  Elderly morbidly obese Caucasian female laying in bed in no acute distress.  There is an ulceration over the dorsal aspect of the left  foot that is approximately 10 cm x 6 cm with eschar covering tissue however there is surrounding erythema and induration with tenderness to palpation.  Patient also has hematoma/ulceration to the heel with at least 1 suture in place and is limited on exam secondary to pain. ED Results / Procedures / Treatments  Labs (all labs ordered are listed, but only abnormal results are displayed) Labs Reviewed  COMPREHENSIVE METABOLIC PANEL - Abnormal; Notable for the following components:      Result Value   Sodium 131 (*)    Chloride 96 (*)    Glucose, Bld 105 (*)    BUN 47 (*)    Creatinine, Ser 1.91 (*)    GFR, Estimated 28 (*)    All other components within normal limits  CBC WITH DIFFERENTIAL/PLATELET - Abnormal; Notable for the following components:   WBC 11.9 (*)    RBC 3.78 (*)    Hemoglobin 10.4 (*)    HCT 32.7 (*)    Neutro Abs 9.9 (*)    Lymphs Abs 0.6 (*)    All other components within normal limits  URINALYSIS, ROUTINE W REFLEX MICROSCOPIC - Abnormal; Notable for the following components:  APPearance HAZY (*)    Ketones, ur TRACE (*)    Protein, ur 30 (*)    Leukocytes,Ua MODERATE (*)    All other components within normal limits  URINALYSIS, MICROSCOPIC (REFLEX) - Abnormal; Notable for the following components:   Bacteria, UA RARE (*)    Non Squamous Epithelial PRESENT (*)    All other components within normal limits  RESP PANEL BY RT-PCR (FLU A&B, COVID) ARPGX2  CULTURE, BLOOD (ROUTINE X 2)  CULTURE, BLOOD (ROUTINE X 2)  LACTIC ACID, PLASMA  PROTIME-INR  LACTIC ACID, PLASMA   RADIOLOGY ED MD interpretation: X-ray of the left foot interpreted by me and shows soft tissue edema with unchanged linear 7 mm density in the plantar soft tissues.  Of note also that the previously suspected calcaneal osteomyelitis seen on previous MRI has no radiographic correlate  One-view portable chest x-ray interpreted by me shows no evidence of acute abnormalities including no pneumonia,  pneumothorax, or widened mediastinum -Agree with radiology assessment Official radiology report(s): MR FOOT LEFT WO CONTRAST  Result Date: 01/17/2022 CLINICAL DATA:  Foot swelling, osteomyelitis suspected. Patient with left anterior foot ulcer and heel ulcer with prior left heel debridement. EXAM: MRI OF THE LEFT FOOT WITHOUT CONTRAST TECHNIQUE: Multiplanar, multisequence MR imaging of the left was performed. No intravenous contrast was administered. COMPARISON:  Radiographs dated January 17, 2022 FINDINGS: Evaluation of multiple sequences is limited due to motion. Bones/Joint/Cartilage There is bone marrow edema of the posterior aspect of the calcaneus adjacent to the deep skin wound inferior to the insertion of the Achilles tendon concerning for ongoing osteomyelitis. Ligaments Limited evaluation due to motion. Muscles and Tendons Increased intrasubstance signal of the plantar muscles suggesting diabetic myopathy/myositis. No fluid collection or abscess. Soft tissues Subcutaneous soft tissue edema about the dorsum and plantar aspect of the foot. No fluid collection or abscess. Deep skin wound about the posterior aspect of the calcaneus inferior to the Achilles insertion. IMPRESSION: 1. Bone marrow edema of the posterior aspect of the calcaneus adjacent to the deep skin wound about the inferior aspect of the Achilles tendon consistent with ongoing chronic osteomyelitis. It is difficult to compare progress from prior examination due to motion and unavailability of comparable sequences. 2. Increased intramuscular signal of the plantar muscles suggesting diabetic myopathy/myositis. 3. Generalized subcutaneous soft tissue edema about the foot without evidence of fluid collection or abscess. Electronically Signed   By: Keane Police D.O.   On: 01/17/2022 22:13   DG Foot 2 Views Left  Result Date: 01/17/2022 CLINICAL DATA:  Left foot wound. EXAM: LEFT FOOT - 2 VIEW COMPARISON:  Radiograph 01/09/2022, heel MRI  12/23/2021 FINDINGS: The bones are diffusely under mineralized. The soft tissue defect posterior to the calcaneus on prior radiograph is less apparent on the current exam. Again seen linear density in the plantar soft tissues measuring 7 mm on the lateral view, cannot be further localized on the AP view. No erosion, bone destruction, or fracture. There is diffuse soft tissue edema. No soft tissue gas. IMPRESSION: 1. Soft tissue edema. 2. The previous suspected calcaneal osteomyelitis an MRI has no radiographic correlate. 3. Unchanged linear 7 mm density in the plantar soft tissues, may represent foreign body or soft tissue calcification. Electronically Signed   By: Keith Rake M.D.   On: 01/17/2022 18:09   DG Chest 1 View  Result Date: 01/17/2022 CLINICAL DATA:  Left foot wound, possible sepsis EXAM: CHEST  1 VIEW COMPARISON:  03/23/2021 FINDINGS: Single frontal view of  the chest demonstrates a stable cardiac silhouette. Chronic left suprahilar scarring. No airspace disease, effusion, or pneumothorax. No acute bony abnormalities. IMPRESSION: 1. No acute intrathoracic process. Electronically Signed   By: Randa Ngo M.D.   On: 01/17/2022 18:06   PROCEDURES: Critical Care performed: Yes, see critical care procedure note(s) .1-3 Lead EKG Interpretation  Performed by: Naaman Plummer, MD Authorized by: Naaman Plummer, MD     Interpretation: normal     ECG rate:  133   ECG rate assessment: tachycardic     Rhythm: sinus tachycardia     Ectopy: none     Conduction: normal    MEDICATIONS ORDERED IN ED: Medications - No data to display IMPRESSION / MDM / [place] / ED COURSE  I reviewed the triage vital signs and the nursing notes.                             The patient is on the cardiac monitor to evaluate for evidence of arrhythmia and/or significant heart rate changes. Patient's presentation is most consistent with acute presentation with potential threat to life or bodily  function. Presentation most consistent with simple cellulitis. Given History, Exam, and Workup I have low suspicion for Necrotizing Fasciitis, Abscess, Osteomyelitis, DVT or other emergent problem as a cause for this presentation.  Rx: Bactrim and Keflex.  Podiatry and wound care follow-up  Disposition: Discharge. No evidence of serious bacterial illness. Nontoxic appearing, VSS. Low risk for treatment failure based on history. Strict return precautions discussed with patient with full understanding. Advised patient to follow up promptly with primary care provider within next 48 hours.   FINAL CLINICAL IMPRESSION(S) / ED DIAGNOSES   Final diagnoses:  Cellulitis of left lower extremity  Chronic osteomyelitis involving ankle and foot, left ([place])   Rx / DC Orders   ED Discharge Orders          Ordered    sulfamethoxazole-trimethoprim (BACTRIM DS) 800-160 MG tablet  2 times daily        01/17/22 2241    cephALEXin (KEFLEX) 500 MG capsule  4 times daily        01/17/22 2241    Ambulatory referral to Podiatry        01/17/22 2242    AMB referral to wound care center        01/17/22 2242           Note:  This document was prepared using Dragon voice recognition software and may include unintentional dictation errors.   Naaman Plummer, MD 01/18/22 0005

## 2022-01-18 DIAGNOSIS — R112 Nausea with vomiting, unspecified: Secondary | ICD-10-CM | POA: Diagnosis not present

## 2022-01-18 DIAGNOSIS — A419 Sepsis, unspecified organism: Secondary | ICD-10-CM | POA: Diagnosis not present

## 2022-01-18 DIAGNOSIS — L03116 Cellulitis of left lower limb: Secondary | ICD-10-CM | POA: Diagnosis not present

## 2022-01-18 DIAGNOSIS — I10 Essential (primary) hypertension: Secondary | ICD-10-CM | POA: Diagnosis not present

## 2022-01-18 DIAGNOSIS — G9009 Other idiopathic peripheral autonomic neuropathy: Secondary | ICD-10-CM | POA: Diagnosis not present

## 2022-01-18 DIAGNOSIS — L89624 Pressure ulcer of left heel, stage 4: Secondary | ICD-10-CM | POA: Diagnosis not present

## 2022-01-18 DIAGNOSIS — G35 Multiple sclerosis: Secondary | ICD-10-CM | POA: Diagnosis not present

## 2022-01-18 DIAGNOSIS — M86172 Other acute osteomyelitis, left ankle and foot: Secondary | ICD-10-CM | POA: Diagnosis not present

## 2022-01-22 DIAGNOSIS — R112 Nausea with vomiting, unspecified: Secondary | ICD-10-CM | POA: Diagnosis not present

## 2022-01-22 DIAGNOSIS — I10 Essential (primary) hypertension: Secondary | ICD-10-CM | POA: Diagnosis not present

## 2022-01-22 DIAGNOSIS — L89624 Pressure ulcer of left heel, stage 4: Secondary | ICD-10-CM | POA: Diagnosis not present

## 2022-01-22 LAB — CULTURE, BLOOD (ROUTINE X 2)
Culture: NO GROWTH
Culture: NO GROWTH
Special Requests: ADEQUATE
Special Requests: ADEQUATE

## 2022-01-23 ENCOUNTER — Other Ambulatory Visit: Payer: Self-pay

## 2022-01-23 ENCOUNTER — Inpatient Hospital Stay: Payer: Medicare HMO

## 2022-01-23 ENCOUNTER — Emergency Department: Payer: Medicare HMO

## 2022-01-23 ENCOUNTER — Inpatient Hospital Stay
Admit: 2022-01-23 | Discharge: 2022-01-23 | Disposition: A | Discharge status: Home / Self Care | Payer: Medicare HMO | Attending: Pulmonary Disease | Admitting: Pulmonary Disease

## 2022-01-23 ENCOUNTER — Inpatient Hospital Stay
Admission: EM | Admit: 2022-01-23 | Discharge: 2022-02-06 | DRG: 981 | Disposition: A | Discharge status: Skilled Nursing Facility | Payer: Medicare HMO | Attending: Internal Medicine | Admitting: Internal Medicine

## 2022-01-23 DIAGNOSIS — Z515 Encounter for palliative care: Secondary | ICD-10-CM | POA: Diagnosis not present

## 2022-01-23 DIAGNOSIS — L0390 Cellulitis, unspecified: Secondary | ICD-10-CM | POA: Diagnosis not present

## 2022-01-23 DIAGNOSIS — I5031 Acute diastolic (congestive) heart failure: Secondary | ICD-10-CM | POA: Diagnosis not present

## 2022-01-23 DIAGNOSIS — Z66 Do not resuscitate: Secondary | ICD-10-CM | POA: Diagnosis present

## 2022-01-23 DIAGNOSIS — Z743 Need for continuous supervision: Secondary | ICD-10-CM | POA: Diagnosis not present

## 2022-01-23 DIAGNOSIS — Y846 Urinary catheterization as the cause of abnormal reaction of the patient, or of later complication, without mention of misadventure at the time of the procedure: Secondary | ICD-10-CM | POA: Diagnosis present

## 2022-01-23 DIAGNOSIS — N319 Neuromuscular dysfunction of bladder, unspecified: Secondary | ICD-10-CM | POA: Diagnosis present

## 2022-01-23 DIAGNOSIS — E875 Hyperkalemia: Secondary | ICD-10-CM | POA: Diagnosis present

## 2022-01-23 DIAGNOSIS — Z841 Family history of disorders of kidney and ureter: Secondary | ICD-10-CM

## 2022-01-23 DIAGNOSIS — I480 Paroxysmal atrial fibrillation: Secondary | ICD-10-CM | POA: Diagnosis present

## 2022-01-23 DIAGNOSIS — E872 Acidosis, unspecified: Secondary | ICD-10-CM | POA: Diagnosis present

## 2022-01-23 DIAGNOSIS — A4152 Sepsis due to Pseudomonas: Secondary | ICD-10-CM | POA: Diagnosis present

## 2022-01-23 DIAGNOSIS — Z993 Dependence on wheelchair: Secondary | ICD-10-CM

## 2022-01-23 DIAGNOSIS — E876 Hypokalemia: Secondary | ICD-10-CM | POA: Diagnosis present

## 2022-01-23 DIAGNOSIS — D62 Acute posthemorrhagic anemia: Secondary | ICD-10-CM | POA: Diagnosis not present

## 2022-01-23 DIAGNOSIS — Z87891 Personal history of nicotine dependence: Secondary | ICD-10-CM

## 2022-01-23 DIAGNOSIS — S91302A Unspecified open wound, left foot, initial encounter: Secondary | ICD-10-CM | POA: Diagnosis present

## 2022-01-23 DIAGNOSIS — F419 Anxiety disorder, unspecified: Secondary | ICD-10-CM | POA: Diagnosis present

## 2022-01-23 DIAGNOSIS — M351 Other overlap syndromes: Secondary | ICD-10-CM | POA: Diagnosis present

## 2022-01-23 DIAGNOSIS — G4733 Obstructive sleep apnea (adult) (pediatric): Secondary | ICD-10-CM | POA: Diagnosis present

## 2022-01-23 DIAGNOSIS — F32A Depression, unspecified: Secondary | ICD-10-CM | POA: Diagnosis present

## 2022-01-23 DIAGNOSIS — M86172 Other acute osteomyelitis, left ankle and foot: Secondary | ICD-10-CM | POA: Diagnosis not present

## 2022-01-23 DIAGNOSIS — D631 Anemia in chronic kidney disease: Secondary | ICD-10-CM | POA: Diagnosis not present

## 2022-01-23 DIAGNOSIS — E1169 Type 2 diabetes mellitus with other specified complication: Secondary | ICD-10-CM | POA: Diagnosis present

## 2022-01-23 DIAGNOSIS — E871 Hypo-osmolality and hyponatremia: Secondary | ICD-10-CM | POA: Diagnosis present

## 2022-01-23 DIAGNOSIS — M6289 Other specified disorders of muscle: Secondary | ICD-10-CM | POA: Diagnosis not present

## 2022-01-23 DIAGNOSIS — L03116 Cellulitis of left lower limb: Secondary | ICD-10-CM | POA: Diagnosis present

## 2022-01-23 DIAGNOSIS — I130 Hypertensive heart and chronic kidney disease with heart failure and stage 1 through stage 4 chronic kidney disease, or unspecified chronic kidney disease: Secondary | ICD-10-CM | POA: Diagnosis not present

## 2022-01-23 DIAGNOSIS — E11621 Type 2 diabetes mellitus with foot ulcer: Secondary | ICD-10-CM | POA: Diagnosis present

## 2022-01-23 DIAGNOSIS — Z6841 Body Mass Index (BMI) 40.0 and over, adult: Secondary | ICD-10-CM

## 2022-01-23 DIAGNOSIS — I959 Hypotension, unspecified: Secondary | ICD-10-CM | POA: Diagnosis not present

## 2022-01-23 DIAGNOSIS — J9601 Acute respiratory failure with hypoxia: Secondary | ICD-10-CM | POA: Diagnosis present

## 2022-01-23 DIAGNOSIS — H548 Legal blindness, as defined in USA: Secondary | ICD-10-CM | POA: Diagnosis present

## 2022-01-23 DIAGNOSIS — T83511A Infection and inflammatory reaction due to indwelling urethral catheter, initial encounter: Principal | ICD-10-CM | POA: Diagnosis present

## 2022-01-23 DIAGNOSIS — R0602 Shortness of breath: Secondary | ICD-10-CM | POA: Diagnosis not present

## 2022-01-23 DIAGNOSIS — Z9981 Dependence on supplemental oxygen: Secondary | ICD-10-CM

## 2022-01-23 DIAGNOSIS — L97529 Non-pressure chronic ulcer of other part of left foot with unspecified severity: Secondary | ICD-10-CM | POA: Diagnosis present

## 2022-01-23 DIAGNOSIS — N189 Chronic kidney disease, unspecified: Secondary | ICD-10-CM | POA: Diagnosis not present

## 2022-01-23 DIAGNOSIS — L97329 Non-pressure chronic ulcer of left ankle with unspecified severity: Secondary | ICD-10-CM | POA: Diagnosis present

## 2022-01-23 DIAGNOSIS — R279 Unspecified lack of coordination: Secondary | ICD-10-CM | POA: Diagnosis not present

## 2022-01-23 DIAGNOSIS — L89624 Pressure ulcer of left heel, stage 4: Secondary | ICD-10-CM | POA: Diagnosis not present

## 2022-01-23 DIAGNOSIS — I70262 Atherosclerosis of native arteries of extremities with gangrene, left leg: Secondary | ICD-10-CM | POA: Diagnosis not present

## 2022-01-23 DIAGNOSIS — G35 Multiple sclerosis: Secondary | ICD-10-CM | POA: Diagnosis not present

## 2022-01-23 DIAGNOSIS — Z452 Encounter for adjustment and management of vascular access device: Secondary | ICD-10-CM | POA: Diagnosis not present

## 2022-01-23 DIAGNOSIS — C8510 Unspecified B-cell lymphoma, unspecified site: Secondary | ICD-10-CM | POA: Diagnosis present

## 2022-01-23 DIAGNOSIS — N3080 Other cystitis without hematuria: Secondary | ICD-10-CM | POA: Diagnosis not present

## 2022-01-23 DIAGNOSIS — N1831 Chronic kidney disease, stage 3a: Secondary | ICD-10-CM | POA: Diagnosis not present

## 2022-01-23 DIAGNOSIS — I5023 Acute on chronic systolic (congestive) heart failure: Secondary | ICD-10-CM | POA: Diagnosis not present

## 2022-01-23 DIAGNOSIS — Z79899 Other long term (current) drug therapy: Secondary | ICD-10-CM

## 2022-01-23 DIAGNOSIS — R0689 Other abnormalities of breathing: Secondary | ICD-10-CM | POA: Diagnosis not present

## 2022-01-23 DIAGNOSIS — N2581 Secondary hyperparathyroidism of renal origin: Secondary | ICD-10-CM | POA: Diagnosis not present

## 2022-01-23 DIAGNOSIS — I5032 Chronic diastolic (congestive) heart failure: Secondary | ICD-10-CM | POA: Diagnosis not present

## 2022-01-23 DIAGNOSIS — A419 Sepsis, unspecified organism: Secondary | ICD-10-CM | POA: Diagnosis present

## 2022-01-23 DIAGNOSIS — I509 Heart failure, unspecified: Secondary | ICD-10-CM | POA: Diagnosis not present

## 2022-01-23 DIAGNOSIS — M8660 Other chronic osteomyelitis, unspecified site: Secondary | ICD-10-CM | POA: Diagnosis not present

## 2022-01-23 DIAGNOSIS — R001 Bradycardia, unspecified: Secondary | ICD-10-CM | POA: Diagnosis present

## 2022-01-23 DIAGNOSIS — I5033 Acute on chronic diastolic (congestive) heart failure: Secondary | ICD-10-CM | POA: Diagnosis present

## 2022-01-23 DIAGNOSIS — B965 Pseudomonas (aeruginosa) (mallei) (pseudomallei) as the cause of diseases classified elsewhere: Secondary | ICD-10-CM | POA: Diagnosis not present

## 2022-01-23 DIAGNOSIS — R0902 Hypoxemia: Secondary | ICD-10-CM | POA: Diagnosis not present

## 2022-01-23 DIAGNOSIS — E1151 Type 2 diabetes mellitus with diabetic peripheral angiopathy without gangrene: Secondary | ICD-10-CM | POA: Diagnosis present

## 2022-01-23 DIAGNOSIS — N183 Chronic kidney disease, stage 3 unspecified: Secondary | ICD-10-CM | POA: Diagnosis present

## 2022-01-23 DIAGNOSIS — E785 Hyperlipidemia, unspecified: Secondary | ICD-10-CM | POA: Diagnosis present

## 2022-01-23 DIAGNOSIS — Z811 Family history of alcohol abuse and dependence: Secondary | ICD-10-CM

## 2022-01-23 DIAGNOSIS — M419 Scoliosis, unspecified: Secondary | ICD-10-CM | POA: Diagnosis present

## 2022-01-23 DIAGNOSIS — Z7189 Other specified counseling: Secondary | ICD-10-CM | POA: Diagnosis not present

## 2022-01-23 DIAGNOSIS — M869 Osteomyelitis, unspecified: Secondary | ICD-10-CM | POA: Diagnosis not present

## 2022-01-23 DIAGNOSIS — Z818 Family history of other mental and behavioral disorders: Secondary | ICD-10-CM

## 2022-01-23 DIAGNOSIS — M7989 Other specified soft tissue disorders: Secondary | ICD-10-CM | POA: Diagnosis not present

## 2022-01-23 DIAGNOSIS — L97429 Non-pressure chronic ulcer of left heel and midfoot with unspecified severity: Secondary | ICD-10-CM | POA: Diagnosis not present

## 2022-01-23 DIAGNOSIS — Z89511 Acquired absence of right leg below knee: Secondary | ICD-10-CM

## 2022-01-23 DIAGNOSIS — Z7951 Long term (current) use of inhaled steroids: Secondary | ICD-10-CM

## 2022-01-23 DIAGNOSIS — I492 Junctional premature depolarization: Secondary | ICD-10-CM | POA: Diagnosis not present

## 2022-01-23 DIAGNOSIS — N179 Acute kidney failure, unspecified: Secondary | ICD-10-CM | POA: Diagnosis not present

## 2022-01-23 DIAGNOSIS — N3090 Cystitis, unspecified without hematuria: Secondary | ICD-10-CM | POA: Diagnosis present

## 2022-01-23 DIAGNOSIS — J449 Chronic obstructive pulmonary disease, unspecified: Secondary | ICD-10-CM | POA: Diagnosis present

## 2022-01-23 DIAGNOSIS — R6521 Severe sepsis with septic shock: Secondary | ICD-10-CM | POA: Diagnosis present

## 2022-01-23 DIAGNOSIS — Z823 Family history of stroke: Secondary | ICD-10-CM

## 2022-01-23 DIAGNOSIS — Z8249 Family history of ischemic heart disease and other diseases of the circulatory system: Secondary | ICD-10-CM

## 2022-01-23 DIAGNOSIS — Z825 Family history of asthma and other chronic lower respiratory diseases: Secondary | ICD-10-CM

## 2022-01-23 DIAGNOSIS — R918 Other nonspecific abnormal finding of lung field: Secondary | ICD-10-CM | POA: Diagnosis not present

## 2022-01-23 DIAGNOSIS — M86672 Other chronic osteomyelitis, left ankle and foot: Secondary | ICD-10-CM | POA: Diagnosis not present

## 2022-01-23 DIAGNOSIS — Z833 Family history of diabetes mellitus: Secondary | ICD-10-CM

## 2022-01-23 DIAGNOSIS — F909 Attention-deficit hyperactivity disorder, unspecified type: Secondary | ICD-10-CM | POA: Diagnosis present

## 2022-01-23 DIAGNOSIS — E1122 Type 2 diabetes mellitus with diabetic chronic kidney disease: Secondary | ICD-10-CM | POA: Diagnosis present

## 2022-01-23 DIAGNOSIS — R600 Localized edema: Secondary | ICD-10-CM | POA: Diagnosis not present

## 2022-01-23 DIAGNOSIS — I700 Atherosclerosis of aorta: Secondary | ICD-10-CM | POA: Diagnosis present

## 2022-01-23 DIAGNOSIS — Z8261 Family history of arthritis: Secondary | ICD-10-CM

## 2022-01-23 LAB — CBC WITH DIFFERENTIAL/PLATELET
Abs Immature Granulocytes: 0.04 10*3/uL (ref 0.00–0.07)
Basophils Absolute: 0.1 10*3/uL (ref 0.0–0.1)
Basophils Relative: 1 %
Eosinophils Absolute: 0.2 10*3/uL (ref 0.0–0.5)
Eosinophils Relative: 3 %
HCT: 26.5 % — ABNORMAL LOW (ref 36.0–46.0)
Hemoglobin: 8.4 g/dL — ABNORMAL LOW (ref 12.0–15.0)
Immature Granulocytes: 1 %
Lymphocytes Relative: 5 %
Lymphs Abs: 0.4 10*3/uL — ABNORMAL LOW (ref 0.7–4.0)
MCH: 26.8 pg (ref 26.0–34.0)
MCHC: 31.7 g/dL (ref 30.0–36.0)
MCV: 84.4 fL (ref 80.0–100.0)
Monocytes Absolute: 0.4 10*3/uL (ref 0.1–1.0)
Monocytes Relative: 5 %
Neutro Abs: 7.1 10*3/uL (ref 1.7–7.7)
Neutrophils Relative %: 85 %
Platelets: 243 10*3/uL (ref 150–400)
RBC: 3.14 MIL/uL — ABNORMAL LOW (ref 3.87–5.11)
RDW: 14.4 % (ref 11.5–15.5)
WBC: 8.2 10*3/uL (ref 4.0–10.5)
nRBC: 0 % (ref 0.0–0.2)

## 2022-01-23 LAB — PROTIME-INR
INR: 1.3 — ABNORMAL HIGH (ref 0.8–1.2)
Prothrombin Time: 15.9 seconds — ABNORMAL HIGH (ref 11.4–15.2)

## 2022-01-23 LAB — COMPREHENSIVE METABOLIC PANEL
ALT: 22 U/L (ref 0–44)
AST: 21 U/L (ref 15–41)
Albumin: 3.3 g/dL — ABNORMAL LOW (ref 3.5–5.0)
Alkaline Phosphatase: 64 U/L (ref 38–126)
Anion gap: 14 (ref 5–15)
BUN: 84 mg/dL — ABNORMAL HIGH (ref 8–23)
CO2: 16 mmol/L — ABNORMAL LOW (ref 22–32)
Calcium: 8.3 mg/dL — ABNORMAL LOW (ref 8.9–10.3)
Chloride: 92 mmol/L — ABNORMAL LOW (ref 98–111)
Creatinine, Ser: 7.07 mg/dL — ABNORMAL HIGH (ref 0.44–1.00)
GFR, Estimated: 6 mL/min — ABNORMAL LOW (ref >60)
Glucose, Bld: 82 mg/dL (ref 70–99)
Potassium: 6 mmol/L — ABNORMAL HIGH (ref 3.5–5.1)
Sodium: 122 mmol/L — ABNORMAL LOW (ref 135–145)
Total Bilirubin: 0.8 mg/dL (ref 0.3–1.2)
Total Protein: 6.5 g/dL (ref 6.5–8.1)

## 2022-01-23 LAB — PROCALCITONIN: Procalcitonin: 0.28 ng/mL

## 2022-01-23 LAB — CORTISOL: Cortisol, Plasma: 11.3 ug/dL

## 2022-01-23 LAB — MAGNESIUM: Magnesium: 2.5 mg/dL — ABNORMAL HIGH (ref 1.7–2.4)

## 2022-01-23 LAB — BRAIN NATRIURETIC PEPTIDE: B Natriuretic Peptide: 1229.6 pg/mL — ABNORMAL HIGH (ref 0.0–100.0)

## 2022-01-23 LAB — APTT: aPTT: 35 seconds (ref 24–36)

## 2022-01-23 LAB — MRSA NEXT GEN BY PCR, NASAL: MRSA by PCR Next Gen: NOT DETECTED

## 2022-01-23 LAB — LACTIC ACID, PLASMA: Lactic Acid, Venous: 0.7 mmol/L (ref 0.5–1.9)

## 2022-01-23 LAB — TROPONIN I (HIGH SENSITIVITY): Troponin I (High Sensitivity): 7 ng/L (ref <18)

## 2022-01-23 MED ORDER — ANTICOAGULANT SODIUM CITRATE 4% (200MG/5ML) IV SOLN: 5.0000 mL | Status: DC | PRN

## 2022-01-23 MED ORDER — IPRATROPIUM-ALBUTEROL 0.5-2.5 (3) MG/3ML IN SOLN
3.0000 mL | Freq: Once | RESPIRATORY_TRACT | Status: AC
  Administered 2022-01-23: 3 mL via RESPIRATORY_TRACT
  Filled 2022-01-23: qty 3

## 2022-01-23 MED ORDER — CHLORHEXIDINE GLUCONATE CLOTH 2 % EX PADS
6.0000 | MEDICATED_PAD | Freq: Every day | CUTANEOUS | Status: DC
  Administered 2022-01-23 – 2022-02-06 (×14): 6 via TOPICAL

## 2022-01-23 MED ORDER — VANCOMYCIN HCL 2000 MG/400ML IV SOLN
2000.0000 mg | Freq: Once | INTRAVENOUS | Status: AC
  Administered 2022-01-23: 2000 mg via INTRAVENOUS
  Filled 2022-01-23: qty 400

## 2022-01-23 MED ORDER — DEXTROSE 50 % IV SOLN
25.0000 g | Freq: Once | INTRAVENOUS | Status: AC
  Administered 2022-01-23: 25 g via INTRAVENOUS
  Filled 2022-01-23: qty 50

## 2022-01-23 MED ORDER — IPRATROPIUM-ALBUTEROL 0.5-2.5 (3) MG/3ML IN SOLN
3.0000 mL | Freq: Four times a day (QID) | RESPIRATORY_TRACT | Status: DC
  Administered 2022-01-23 – 2022-01-25 (×6): 3 mL via RESPIRATORY_TRACT
  Filled 2022-01-23 (×5): qty 3

## 2022-01-23 MED ORDER — FENTANYL CITRATE PF 50 MCG/ML IJ SOSY
PREFILLED_SYRINGE | INTRAMUSCULAR | Status: AC
  Filled 2022-01-23: qty 1

## 2022-01-23 MED ORDER — HEPARIN SODIUM (PORCINE) 5000 UNIT/ML IJ SOLN
5000.0000 [IU] | Freq: Three times a day (TID) | INTRAMUSCULAR | Status: DC
  Administered 2022-01-23 – 2022-02-06 (×37): 5000 [IU] via SUBCUTANEOUS
  Filled 2022-01-23 (×38): qty 1

## 2022-01-23 MED ORDER — NOREPINEPHRINE 4 MG/250ML-% IV SOLN
0.0000 ug/min | INTRAVENOUS | Status: DC
  Administered 2022-01-23 (×2): 2 ug/min via INTRAVENOUS
  Administered 2022-01-24: 4 ug/min via INTRAVENOUS
  Administered 2022-01-24: 6 ug/min via INTRAVENOUS
  Administered 2022-01-25 (×2): 4 ug/min via INTRAVENOUS
  Filled 2022-01-23 (×4): qty 250

## 2022-01-23 MED ORDER — SODIUM CHLORIDE 0.9 % IV BOLUS
1000.0000 mL | Freq: Once | INTRAVENOUS | Status: AC
  Administered 2022-01-23: 1000 mL via INTRAVENOUS

## 2022-01-23 MED ORDER — PIPERACILLIN-TAZOBACTAM 3.375 G IVPB 30 MIN
3.3750 g | Freq: Once | INTRAVENOUS | Status: AC
  Administered 2022-01-23: 3.375 g via INTRAVENOUS
  Filled 2022-01-23: qty 50

## 2022-01-23 MED ORDER — CALCIUM GLUCONATE-NACL 1-0.675 GM/50ML-% IV SOLN
1.0000 g | Freq: Once | INTRAVENOUS | Status: AC
  Administered 2022-01-23: 1000 mg via INTRAVENOUS
  Filled 2022-01-23: qty 50

## 2022-01-23 MED ORDER — ALTEPLASE 2 MG IJ SOLR: 2.0000 mg | Freq: Once | INTRAMUSCULAR | Status: DC | PRN

## 2022-01-23 MED ORDER — HYDROCORTISONE SOD SUC (PF) 100 MG IJ SOLR
100.0000 mg | Freq: Once | INTRAMUSCULAR | Status: AC
  Administered 2022-01-23: 100 mg via INTRAVENOUS
  Filled 2022-01-23 (×2): qty 2

## 2022-01-23 MED ORDER — CHLORHEXIDINE GLUCONATE CLOTH 2 % EX PADS
6.0000 | MEDICATED_PAD | Freq: Every day | CUTANEOUS | Status: DC
  Administered 2022-01-25 – 2022-01-27 (×3): 6 via TOPICAL

## 2022-01-23 MED ORDER — TECHNETIUM TO 99M ALBUMIN AGGREGATED
4.5000 | Freq: Once | INTRAVENOUS | Status: AC | PRN
  Administered 2022-01-23: 4.5 via INTRAVENOUS

## 2022-01-23 MED ORDER — PENTAFLUOROPROP-TETRAFLUOROETH EX AERO: 1.0000 | INHALATION_SPRAY | CUTANEOUS | Status: DC | PRN

## 2022-01-23 MED ORDER — FENTANYL CITRATE PF 50 MCG/ML IJ SOSY
25.0000 ug | PREFILLED_SYRINGE | Freq: Once | INTRAMUSCULAR | Status: AC
  Administered 2022-01-23: 25 ug via INTRAVENOUS

## 2022-01-23 MED ORDER — LIDOCAINE HCL (PF) 1 % IJ SOLN: 5.0000 mL | INTRAMUSCULAR | Status: DC | PRN

## 2022-01-23 MED ORDER — LIDOCAINE-PRILOCAINE 2.5-2.5 % EX CREA: 1.0000 | TOPICAL_CREAM | CUTANEOUS | Status: DC | PRN

## 2022-01-23 MED ORDER — LACTATED RINGERS IV BOLUS (SEPSIS)
1000.0000 mL | Freq: Once | INTRAVENOUS | Status: AC
  Administered 2022-01-23: 1000 mL via INTRAVENOUS

## 2022-01-23 MED ORDER — INSULIN ASPART 100 UNIT/ML IV SOLN
5.0000 [IU] | Freq: Once | INTRAVENOUS | Status: AC
  Administered 2022-01-23: 5 [IU] via INTRAVENOUS
  Filled 2022-01-23: qty 0.05

## 2022-01-23 MED ORDER — HEPARIN SODIUM (PORCINE) 1000 UNIT/ML DIALYSIS: 1000.0000 [IU] | INTRAMUSCULAR | Status: DC | PRN

## 2022-01-23 MED ORDER — POLYETHYLENE GLYCOL 3350 17 G PO PACK: 17.0000 g | PACK | Freq: Every day | ORAL | Status: DC | PRN

## 2022-01-23 NOTE — ED Notes (Signed)
Attempted for 22g at R hand. Will notify EDP McHugh that [place] may be needed.

## 2022-01-23 NOTE — ED Notes (Signed)
Having trouble confirming accuracy of BP. Medium cuff at pt's R wrist. Attempting repeat; either reads pt very low or very high. Pt educated to relax the best she can while cuff inflating and deflating.

## 2022-01-23 NOTE — ED Notes (Signed)
This RN transported pt with help of imaging staff straight from imaging to ICU room 7. Bedside report given to primary ICU RN.

## 2022-01-23 NOTE — ED Notes (Signed)
Pt has a open and infected wound to top of her L foot that has been treated with an hospital admission recently. There is purulent drainage and a foul odor and s/s of infection. Pt has black area on L heel as well as the top of the foot. Pt is A&Ox4.

## 2022-01-23 NOTE — ED Notes (Addendum)
Pt c/o soreness at L upper arm above IV. Site assessed; upper arm cool, taut; IV previously flushed as LR bolus had been running through it and pt had no complaints at that time; reassessed by checking for blood return; unable to pull back blood at this time. LR bolus paused. IV to be removed as infiltrated. Pt's arm elevated on pillow. EDP McHugh notified via secure chat.

## 2022-01-23 NOTE — ED Notes (Signed)
Rate of NS bolus dec from 999cc/hr to 250cc/hr per verbal from [place]; last 300cc of LR bolus stopped earlier when noted that L ac IV infiltrated and d/c so not given. NS running on pump currently.

## 2022-01-23 NOTE — ED Triage Notes (Signed)
Pt presents to ED with c/o of possible sepsis due to infection to LLE. Pt recently D/C'ed for the same. Pt hypotensive on arrival. Pt is A&Ox4.

## 2022-01-23 NOTE — ED Notes (Addendum)
EDP McHugh at bedside. Visitor remains at bedside. Pt's wheezing continues though better than before breathing tx. Visitor states pt HR didn't used to be this low but more recently started dropping to 40-50's. Pt denies being talked to about a PM.

## 2022-01-23 NOTE — ED Notes (Signed)
Levophed and cal gluc at bedside. Attempted for 22g at R fa.

## 2022-01-23 NOTE — H&P (Signed)
CRITICAL CARE PROGRESS NOTE    Name: [name] MRN: [id number] DOB: 1951/03/23     LOS: 0   SUBJECTIVE FINDINGS & SIGNIFICANT EVENTS    Patient description:  71 year old female with a long standing history of MS from age 89 background history of smoking tobacco she quit in her mid 52s as well as intermittent THC smoking per her husband.  Patient is dependent on most ADLs for family including showering and bathing as well as cooking and cleaning and taking care of herself.  Husband states at times even at baseline she does not participate in verbal communication whereas other times she improves and is able to be partially functional.  She was sent here from rehab facility where she was recovering after having surgery to left lower extremity for cellulitis.  She does have a background history of COPD and sleep apnea, follicular intra-abdominal lymphoma, CKD stage III, bladder and bowel incontinence, major depressive disorder obesity class IV with BMI over 40.  In the ED she appears to be in circulatory shock requiring vasopressor support with swollen lower extremities and excoriated wound with mucopurulent areas.  Blood work with elevated BNP over 1200 suggestive of acute decompensated CHF exacerbation.  Pulmonary critical care consultation placed for additional evaluation management of patient in circulatory shock requiring vasopressor support.  Her x-ray shows bilateral diffuse infiltrates both airspace and interstitial opacification consistent with likely pulmonary edema due to CHF/CKD.  Lines/tubes : Urethral Catheter Double-lumen;Non-latex 16 Fr. (Active)  Indication for Insertion or Continuance of Catheter Chronic catheter use 01/12/22 0800  Site Assessment Clean, Dry, Intact;Clean;Dry 01/12/22 0800  Catheter  Maintenance Bag below level of bladder;Bag emptied prior to transport;Insertion date on drainage bag;No dependent loops;Catheter secured;Drainage bag/tubing not touching floor;Seal intact 01/12/22 0800  Collection Container Standard drainage bag 01/12/22 0800  Securement Method Securing device (Describe) 01/12/22 0800  Urinary Catheter Interventions (if applicable) Unclamped 24/40/10 0800  Input (mL) 0 mL 01/11/22 1039  Output (mL) 0 mL 01/12/22 1700    Microbiology/Sepsis markers: Results for orders placed or performed during the hospital encounter of 01/17/22  Culture, blood (Routine x 2)     Status: None   Collection Time: 01/17/22  5:20 PM   Specimen: BLOOD  Result Value Ref Range Status   Specimen Description BLOOD RIGHT ANTECUBITAL  Final   Special Requests   Final    BOTTLES DRAWN AEROBIC AND ANAEROBIC Blood Culture adequate volume   Culture   Final    NO GROWTH 5 DAYS Performed at [facility], [address] [place], [place] [address]    Report Status 01/22/2022 FINAL  Final  Culture, blood (Routine x 2)     Status: None   Collection Time: 01/17/22  5:20 PM   Specimen: BLOOD  Result Value Ref Range Status   Specimen Description BLOOD LEFT ANTECUBITAL  Final   Special Requests   Final    BOTTLES DRAWN AEROBIC AND ANAEROBIC Blood Culture adequate volume   Culture   Final    NO GROWTH 5 DAYS Performed at [facility], [address] [place], [place] [address]    Report Status 01/22/2022 FINAL  Final  Resp Panel by RT-PCR (Flu A&B, Covid) Anterior Nasal Swab     Status: None   Collection Time: 01/17/22  5:24 PM   Specimen: Anterior Nasal Swab  Result Value Ref Range Status   SARS Coronavirus 2 by RT PCR NEGATIVE NEGATIVE Final    Comment: (NOTE) SARS-CoV-2 target nucleic  acids are NOT DETECTED.  The SARS-CoV-2 RNA is generally detectable in upper respiratory specimens during the acute phase of infection. The lowest concentration of SARS-CoV-2  viral copies this assay can detect is 138 copies/mL. A negative result does not preclude SARS-Cov-2 infection and should not be used as the sole basis for treatment or other patient management decisions. A negative result may occur with  improper specimen collection/handling, submission of specimen other than nasopharyngeal swab, presence of viral mutation(s) within the areas targeted by this assay, and inadequate number of viral copies(<138 copies/mL). A negative result must be combined with clinical observations, patient history, and epidemiological information. The expected result is Negative.  Fact Sheet for Patients:  EntrepreneurPulse.com.au  Fact Sheet for Healthcare Providers:  IncredibleEmployment.be  This test is no t yet approved or cleared by the [place] FDA and  has been authorized for detection and/or diagnosis of SARS-CoV-2 by FDA under an Emergency Use Authorization (EUA). This EUA will remain  in effect (meaning this test can be used) for the duration of the COVID-19 declaration under Section 564(b)(1) of the Act, 21 U.S.C.section 360bbb-3(b)(1), unless the authorization is terminated  or revoked sooner.       Influenza A by PCR NEGATIVE NEGATIVE Final   Influenza B by PCR NEGATIVE NEGATIVE Final    Comment: (NOTE) The Xpert Xpress SARS-CoV-2/FLU/RSV plus assay is intended as an aid in the diagnosis of influenza from Nasopharyngeal swab specimens and should not be used as a sole basis for treatment. Nasal washings and aspirates are unacceptable for Xpert Xpress SARS-CoV-2/FLU/RSV testing.  Fact Sheet for Patients: EntrepreneurPulse.com.au  Fact Sheet for Healthcare Providers: IncredibleEmployment.be  This test is not yet approved or cleared by the [place] FDA and has been authorized for detection and/or diagnosis of SARS-CoV-2 by FDA under an Emergency Use Authorization  (EUA). This EUA will remain in effect (meaning this test can be used) for the duration of the COVID-19 declaration under Section 564(b)(1) of the Act, 21 U.S.C. section 360bbb-3(b)(1), unless the authorization is terminated or revoked.  Performed at [facility], [address], [place], [place] [address]     Anti-infectives:  Anti-infectives (From admission, onward)    Start     Dose/Rate Route Frequency Ordered Stop   01/23/22 1015  vancomycin (VANCOREADY) IVPB 2000 mg/400 mL        2,000 mg 200 mL/hr over 120 Minutes Intravenous  Once 01/23/22 1003 01/23/22 1345   01/23/22 1015  piperacillin-tazobactam (ZOSYN) IVPB 3.375 g        3.375 g 100 mL/hr over 30 Minutes Intravenous  Once 01/23/22 1003 01/23/22 1050        Consults: Treatment Team:  Ottie Glazier, MD      PAST MEDICAL HISTORY   Past Medical History:  Diagnosis Date   Abdominal aortic atherosclerosis ([place]) 11/11/2016   ADHD    Anxiety    COPD (chronic obstructive pulmonary disease) ([place])    Depression    major depressive   Dyspnea    doe   Edema    left leg   Follicular lymphoma ([facility])    B Cell   Follicular lymphoma grade II ([facility])    Hypertension    Hypotension    idiopathic   Kyphoscoliosis and scoliosis 11/26/2011   Morbid obesity ([place]) 01/05/2016   Multiple sclerosis ([place])    Multiple sclerosis ([place])    1980's   Neuromuscular disorder ([place])    Obstructive and reflux uropathy    foley  Pain    atypical facial   Peripheral vascular disease of lower extremity with ulceration ([place]) 11/08/2015   Skin ulcer ([place]) 11/08/2015   Weakness    generalized. has MS     SURGICAL HISTORY   Past Surgical History:  Procedure Laterality Date   BACK SURGERY N/A 2002   BONE BIOPSY Left 12/24/2021   Procedure: BONE BIOPSY-HEEL BONE;  Surgeon: Trula Slade, DPM;  Location: [facility];  Service: Podiatry;  Laterality: Left;   CYST EXCISION     lower back   INGUINAL LYMPH NODE BIOPSY Left  07/04/2016   Procedure: INGUINAL LYMPH NODE BIOPSY;  Surgeon: Christene Lye, MD;  Location: [facility];  Service: General;  Laterality: Left;   ORIF FEMUR FRACTURE Left 02/04/2020   Procedure: OPEN REDUCTION INTERNAL FIXATION (ORIF) DISTAL FEMUR FRACTURE;  Surgeon: Shona Needles, MD;  Location: [facility];  Service: Orthopedics;  Laterality: Left;   PORTACATH PLACEMENT N/A 07/22/2016   Procedure: INSERTION PORT-A-CATH;  Surgeon: Christene Lye, MD;  Location: [facility];  Service: General;  Laterality: N/A;   TONSILLECTOMY AND ADENOIDECTOMY     TUBAL LIGATION     WOUND DEBRIDEMENT Left 12/24/2021   Procedure: DEBRIDEMENT WOUND-HEEL ULCER;  Surgeon: Trula Slade, DPM;  Location: [facility];  Service: Podiatry;  Laterality: Left;     FAMILY HISTORY   Family History  Problem Relation Age of Onset   COPD Mother    Diabetes Mother    Heart failure Mother    Alcohol abuse Father    Kidney disease Father    Kidney failure Father    Arthritis Sister    CAD Maternal Grandmother    Stroke Maternal Grandfather    Arthritis Sister    Mental illness Sister    Arthritis Brother      SOCIAL HISTORY   Social History   Tobacco Use   Smoking status: Former    Packs/day: 1.00    Years: 20.00    Total pack years: 20.00    Types: Cigarettes    Start date: 04/30/1995    Quit date: 02/03/2016    Years since quitting: 5.9   Smokeless tobacco: Never  Vaping Use   Vaping Use: Some days  Substance Use Topics   Alcohol use: No    Alcohol/week: 0.0 standard drinks of alcohol   Drug use: Yes    Types: Marijuana    Comment: smokes THC occasionally per pt      MEDICATIONS   Current Medication:  Current Facility-Administered Medications:    heparin injection 5,000 Units, 5,000 Units, Subcutaneous, Q8H, Gianno Volner, MD   hydrocortisone sodium succinate (SOLU-CORTEF) 100 MG injection 100 mg, 100 mg, Intravenous, Once, Starleen Blue, Jennette Dubin, MD   norepinephrine (LEVOPHED) '4mg'$  in  229m (0.016 mg/mL) premix infusion, 0-40 mcg/min, Intravenous, Continuous, MRada Hay MD, Last Rate: 11.25 mL/hr at 01/23/22 1318, 3 mcg/min at 01/23/22 1318   polyethylene glycol (MIRALAX / GLYCOLAX) packet 17 g, 17 g, Oral, Daily PRN, AOttie Glazier MD  Current Outpatient Medications:    amLODipine (NORVASC) 5 MG tablet, Take 5 mg by mouth daily., Disp: , Rfl:    atorvastatin (LIPITOR) 10 MG tablet, Take 10 mg by mouth at bedtime. , Disp: , Rfl:    budesonide-formoterol (SYMBICORT) 160-4.5 MCG/ACT inhaler, Inhale 2 puffs into the lungs 2 (two) times daily. , Disp: , Rfl:    buPROPion (WELLBUTRIN XL) 300 MG 24 hr tablet, Take 300 mg by mouth daily., Disp: , Rfl:  cephALEXin (KEFLEX) 500 MG capsule, Take 1 capsule (500 mg total) by mouth 4 (four) times daily for 10 days., Disp: 40 capsule, Rfl: 0   clonazePAM (KLONOPIN) 0.5 MG tablet, Take 1 tablet (0.5 mg total) by mouth 2 (two) times daily., Disp: 10 tablet, Rfl: 0   DULoxetine (CYMBALTA) 60 MG capsule, Take 1 capsule (60 mg total) by mouth every morning., Disp: 30 capsule, Rfl: 3   gabapentin (NEURONTIN) 600 MG tablet, Take 600 mg by mouth 3 (three) times daily., Disp: , Rfl:    lisinopril (ZESTRIL) 20 MG tablet, Take 1 tablet (20 mg total) by mouth daily., Disp: , Rfl:    Multiple Vitamin (MULTIVITAMIN WITH MINERALS) TABS tablet, Take 1 tablet by mouth daily., Disp: , Rfl:    MYRBETRIQ 50 MG TB24 tablet, TAKE ONE TABLET BY MOUTH ONCE DAILY, Disp: 30 tablet, Rfl: 0   nystatin powder, Apply 1 Application topically 2 (two) times daily., Disp: , Rfl:    QUEtiapine Fumarate (SEROQUEL XR) 150 MG 24 hr tablet, Take 150 mg by mouth at bedtime. , Disp: , Rfl: 4   sulfamethoxazole-trimethoprim (BACTRIM DS) 800-160 MG tablet, Take 1 tablet by mouth 2 (two) times daily for 10 days., Disp: 20 tablet, Rfl: 0   traZODone (DESYREL) 50 MG tablet, Take 50 mg by mouth at bedtime., Disp: , Rfl:    vortioxetine HBr (TRINTELLIX) 5 MG TABS tablet, Take  5 mg by mouth daily., Disp: , Rfl:    acetaminophen (TYLENOL) 500 MG tablet, Take 1,000 mg by mouth every 8 (eight) hours as needed for moderate pain., Disp: , Rfl:    docusate sodium (COLACE) 100 MG capsule, Take 1 capsule (100 mg total) by mouth 2 (two) times daily as needed., Disp: , Rfl:    hydrALAZINE (APRESOLINE) 50 MG tablet, Take 50 mg by mouth 3 (three) times daily. (Patient not taking: Reported on 01/23/2022), Disp: , Rfl:    magnesium oxide (MAG-OX) 400 MG tablet, Take 400 mg by mouth daily. (Patient not taking: Reported on 01/23/2022), Disp: , Rfl:    torsemide (DEMADEX) 20 MG tablet, Take 1 tablet (20 mg total) by mouth daily as needed (edema)., Disp: , Rfl:   Facility-Administered Medications Ordered in Other Encounters:    heparin lock flush 100 unit/mL, 500 Units, Intracatheter, Once PRN, Sindy Guadeloupe, MD    ALLERGIES   Patient has no known allergies.    REVIEW OF SYSTEMS    10 point review of systems conducted and is negative except for dyspnea and left lower extremity discomfort/pain   PHYSICAL EXAMINATION   Vital Signs: Temp:  [98.3 F (36.8 C)] 98.3 F (36.8 C) (09/27 0924) Pulse Rate:  [40-91] 84 (09/27 1420) Resp:  [12-22] 15 (09/27 1425) BP: (65-112)/(39-82) 110/53 (09/27 1420) SpO2:  [94 %-100 %] 100 % (09/27 1415)  GENERAL: Mild distress due to acutely ill status, age-appropriate HEAD: Normocephalic, atraumatic.  EYES: Pupils equal, round, reactive to light.  No scleral icterus.  MOUTH: Moist mucosal membrane. NECK: Supple. No thyromegaly. No nodules. No JVD.  PULMONARY: Crackles bilaterally worse at the bases CARDIOVASCULAR: S1 and S2. Regular rate and rhythm. No murmurs, rubs, or gallops.  GASTROINTESTINAL: Soft, nontender, non-distended. No masses. Positive bowel sounds. No hepatosplenomegaly.  MUSCULOSKELETAL: No swelling, clubbing, or edema.  NEUROLOGIC: Mild distress due to acute illness , patient with multiple sclerosis   SKIN:intact,warm,dry   PERTINENT DATA     Infusions:  norepinephrine (LEVOPHED) Adult infusion 3 mcg/min (01/23/22 1318)   Scheduled Medications:  heparin  5,000 Units Subcutaneous Q8H   hydrocortisone sod succinate (SOLU-CORTEF) inj  100 mg Intravenous Once   PRN Medications: polyethylene glycol Hemodynamic parameters:   Intake/Output: No intake/output data recorded.  Ventilator  Settings:    LAB RESULTS:  Basic Metabolic Panel: Recent Labs  Lab 01/17/22 1720 01/23/22 0937 01/23/22 1038  NA 131* 122*  --   K 4.5 6.0*  --   CL 96* 92*  --   CO2 23 16*  --   GLUCOSE 105* 82  --   BUN 47* 84*  --   CREATININE 1.91* 7.07*  --   CALCIUM 9.1 8.3*  --   MG  --   --  2.5*   Liver Function Tests: Recent Labs  Lab 01/17/22 1720 01/23/22 0937  AST 17 21  ALT 15 22  ALKPHOS 66 64  BILITOT 0.6 0.8  PROT 7.0 6.5  ALBUMIN 3.6 3.3*   No results for input(s): "LIPASE", "AMYLASE" in the last 168 hours. No results for input(s): "AMMONIA" in the last 168 hours. CBC: Recent Labs  Lab 01/17/22 1720 01/23/22 0937  WBC 11.9* 8.2  NEUTROABS 9.9* 7.1  HGB 10.4* 8.4*  HCT 32.7* 26.5*  MCV 86.5 84.4  PLT 257 243   Cardiac Enzymes: No results for input(s): "CKTOTAL", "CKMB", "CKMBINDEX", "TROPONINI" in the last 168 hours. BNP: Invalid input(s): "POCBNP" CBG: No results for input(s): "GLUCAP" in the last 168 hours.     IMAGING RESULTS:  Imaging: DG Foot Complete Left  Result Date: 01/23/2022 CLINICAL DATA:  Evaluate for osteomyelitis. Possible sepsis. Evidence for chronic osteomyelitis in the calcaneus on previous MRI. EXAM: LEFT FOOT - COMPLETE 3+ VIEW COMPARISON:  Left foot 01/17/2022 and foot MRI 01/17/2022 FINDINGS: Again noted is diffuse soft tissue swelling in the left foot. Stable appearance of the calcaneus with calcaneal spurring. No new areas of bone destruction or periosteal reaction involving the calcaneus. Again noted is a linear density along the  plantar soft tissues measuring roughly 8 mm and similar to the prior examination. Negative for an acute fracture or dislocation. Alignment of the left foot is stable. IMPRESSION: 1. No acute bone abnormality to the left foot. No radiographic evidence for osteomyelitis. Refer to recent MRI from 01/17/2022. 2. Stable appearance of the linear density along the plantar soft tissues. Findings are suggestive for a foreign body or focal calcification. 3. Diffuse soft tissue swelling. 4. Calcaneal spurring. Electronically Signed   By: Markus Daft M.D.   On: 01/23/2022 10:10   DG Chest Port 1 View  Result Date: 01/23/2022 CLINICAL DATA:  Concern for sepsis EXAM: PORTABLE CHEST 1 VIEW COMPARISON:  Chest x-ray dated January 17, 2022 FINDINGS: The heart size and mediastinal contours are within normal limits. Low lung volumes with hypoventilatory changes. No definite focal consolidation. The visualized skeletal structures are unremarkable. IMPRESSION: Low lung volumes with hypoventilatory changes. No definite focal consolidation. Electronically Signed   By: Yetta Glassman M.D.   On: 01/23/2022 09:56   '@PROBHOSP'$ @ DG Foot Complete Left  Result Date: 01/23/2022 CLINICAL DATA:  Evaluate for osteomyelitis. Possible sepsis. Evidence for chronic osteomyelitis in the calcaneus on previous MRI. EXAM: LEFT FOOT - COMPLETE 3+ VIEW COMPARISON:  Left foot 01/17/2022 and foot MRI 01/17/2022 FINDINGS: Again noted is diffuse soft tissue swelling in the left foot. Stable appearance of the calcaneus with calcaneal spurring. No new areas of bone destruction or periosteal reaction involving the calcaneus. Again noted is a linear density along the plantar soft tissues measuring  roughly 8 mm and similar to the prior examination. Negative for an acute fracture or dislocation. Alignment of the left foot is stable. IMPRESSION: 1. No acute bone abnormality to the left foot. No radiographic evidence for osteomyelitis. Refer to recent MRI from  01/17/2022. 2. Stable appearance of the linear density along the plantar soft tissues. Findings are suggestive for a foreign body or focal calcification. 3. Diffuse soft tissue swelling. 4. Calcaneal spurring. Electronically Signed   By: Markus Daft M.D.   On: 01/23/2022 10:10   DG Chest Port 1 View  Result Date: 01/23/2022 CLINICAL DATA:  Concern for sepsis EXAM: PORTABLE CHEST 1 VIEW COMPARISON:  Chest x-ray dated January 17, 2022 FINDINGS: The heart size and mediastinal contours are within normal limits. Low lung volumes with hypoventilatory changes. No definite focal consolidation. The visualized skeletal structures are unremarkable. IMPRESSION: Low lung volumes with hypoventilatory changes. No definite focal consolidation. Electronically Signed   By: Yetta Glassman M.D.   On: 01/23/2022 09:56     ASSESSMENT AND PLAN    -Multidisciplinary rounds held today  Acute Hypoxic Respiratory Failure -Due to CHF and CKD overlap syndrome. -supplemental O2 -diuresis as able -nephrology consult  -Resp viral panle -procalcitonin and respiratory cultures  -VQ scan for PE rule out  Acute decompensated diastolic CHF EF >25%   - most recent TTE is >43 years old , will repeat echo  - cardiology consult - appreciate input   - patient with CKD difficult to diurese - nephrology on board -oxygen as needed -Lasix as tolerated -follow up cardiac enzymes as indicated ICU monitoring  Renal Failure-acute on chronic renal failure stage 5 KDIGO -follow chem 7 -follow UO -continue Foley Catheter-assess need daily -nephrology consult - patient may need dialysis due to severe renal impairment, oliguric with electorlyte derrangements  Severe hyperkalemia  -s/p EKG -s/p calcium gluconate with insulin an dD5W   Possible adrenal insufficiency  - patient with hypotension with Na and K electrolyte disturbances  - empirically received hydrocortisone in ER  Septic shock- present on admission due to left  ankle wound infection -use vasopressors to keep MAP>65 -follow ABG and LA -follow up cultures -emperic ABX- Vanco/zosyn IV 3.375 - need renal dosed -consider stress dose steroids   Chronic MS  - patient is at times non verbal and is fully dependent on family for all ADLs  - continue home- trintellix    ID -continue IV abx as prescibed -follow up cultures  GI/Nutrition GI PROPHYLAXIS as indicated DIET-->TF's as tolerated Constipation protocol as indicated  ENDO - ICU hypoglycemic\Hyperglycemia protocol -check FSBS per protocol   ELECTROLYTES -follow labs as needed -replace as needed -pharmacy consultation   DVT/GI PRX ordered -SCDs  TRANSFUSIONS AS NEEDED MONITOR FSBS ASSESS the need for LABS as needed   Critical care provider statement:    Critical care time (minutes):  109   Critical care time was exclusive of:  Separately billable procedures and treating other patients   Critical care was necessary to treat or prevent imminent or life-threatening deterioration of the following conditions:  Septic shock, CHF exacerbation , acute on chronic renal failure, acute hypoxemic respiratory failure, chonic MS   Critical care was time spent personally by me on the following activities:  Development of treatment plan with patient or surrogate, discussions with consultants, evaluation of patient's response to treatment, examination of patient, obtaining history from patient or surrogate, ordering and performing treatments and interventions, ordering and review of laboratory studies and re-evaluation of patient's condition.  I assumed direction of critical care for this patient from another provider in my specialty: no    This document was prepared using Dragon voice recognition software and may include unintentional dictation errors.    Ottie Glazier, M.D.  Division of [place]

## 2022-01-23 NOTE — ED Notes (Signed)
Monitor continues to read BP sometimes 80s/40s and other times 130/100s; attempting to capture most accurate info as possible.

## 2022-01-23 NOTE — ED Notes (Signed)
Anda Kraft, RN at bedside to complete US IV placement.

## 2022-01-23 NOTE — Consult Note (Signed)
PHARMACY CONSULT NOTE - FOLLOW UP  Pharmacy Consult for Electrolyte Monitoring and Replacement   Recent Labs: Potassium (mmol/L)  Date Value  01/23/2022 6.0 (H)  08/01/2013 3.5   Magnesium (mg/dL)  Date Value  01/23/2022 2.5 (H)   Calcium (mg/dL)  Date Value  01/23/2022 8.3 (L)   Calcium, Total (mg/dL)  Date Value  08/01/2013 8.9   Albumin (g/dL)  Date Value  01/23/2022 3.3 (L)  08/01/2013 4.1   Phosphorus (mg/dL)  Date Value  07/02/2021 4.2  07/02/2021 4.1   Sodium (mmol/L)  Date Value  01/23/2022 122 (L)  08/01/2013 125 (L)     Assessment: 71yo F w/ h/o MDD/anxiety, legally blind, COPD on oxygen, DM, pancreatic insufficiency, SDH s/p craniotomy in July 2023, and chronic left heel ulceration c/b chronic osteomyelitis (Dx via MRI on 9/21) who presents with concern for possible sepsis c/b acute renal failure, electrolyte abnormalities and hypovolemic shock. Pharmacy consulted for mgmt of electrolytes  Goal of Therapy:  [place] WNL  Plan: Scr: 7.07; acute onset of renal failure. Expect hemodialysis planned. Na 122; pt receiving NS 1L, LR 1L. Cl 92 K: 6 >__; pt received CaGluc 1g x1, ins/dex x1, nebs, & planning for diuresis with HD. Will d/w MD consideration for lokelma x1. Mg: 2.5 CO2 of 16  Lorna Dibble ,PharmD Clinical Pharmacist 01/23/2022 4:06 PM

## 2022-01-23 NOTE — ED Notes (Signed)
Uriene sample drawn from foley cath port. Urine foggy amber/brown and thick mucous-like substance noted.

## 2022-01-23 NOTE — ED Notes (Signed)
EDP McHugh notified of BP 75/63 with map 69 using large cuff on pt's R upper arm.

## 2022-01-23 NOTE — ED Notes (Signed)
Norepi, NS, Vanc and calc gluc now all running through 18g as micromedex confirms compatibility.

## 2022-01-23 NOTE — Consult Note (Signed)
CARDIOLOGY CONSULT NOTE               Patient ID: [name] MRN: [id number] DOB/AGE: 1950-06-12 71 y.o.  Admit date: 01/23/2022 Referring Physician Dr. Lanney Gins critical care Primary Physician Dr. [name] primary Primary Cardiologist  Reason for Consultation congestive heart failure hypertension atrial fibrillation  HPI: Patient is a 71 year old female multiple medical problems presents with symptoms of septic shock hypotension large ulcer left lower leg.  Patient also appears to be in acute renal failure lower extremity edema has longstanding atrial fibrillation now with heart failure symptoms elevated BNP shortness of breath.  Patient appears to be septic and was admitted for further evaluation and management most recent echocardiogram of 4 months ago suggested preserved overall left ventricular function and at least 55% but now patient has heart failure symptoms acidosis acute renal failure denies any chest pain.  Patient is being admitted to critical care for supportive management and probably acute dialysis because of acute failure.  Patient may need diuresis for heart failure but with her renal failure that would only be possible with dialysis management  Review of systems complete and found to be negative unless listed above     Past Medical History:  Diagnosis Date   Abdominal aortic atherosclerosis ([place]) 11/11/2016   ADHD    Anxiety    COPD (chronic obstructive pulmonary disease) (HCC)    Depression    major depressive   Dyspnea    doe   Edema    left leg   Follicular lymphoma (HCC)    B Cell   Follicular lymphoma grade II (HCC)    Hypertension    Hypotension    idiopathic   Kyphoscoliosis and scoliosis 11/26/2011   Morbid obesity ([place]) 01/05/2016   Multiple sclerosis (HCC)    Multiple sclerosis (HCC)    1980's   Neuromuscular disorder ([place])    Obstructive and reflux uropathy    foley   Pain    atypical facial   Peripheral vascular disease of  lower extremity with ulceration ([place]) 11/08/2015   Skin ulcer ([place]) 11/08/2015   Weakness    generalized. has MS    Past Surgical History:  Procedure Laterality Date   BACK SURGERY N/A 2002   BONE BIOPSY Left 12/24/2021   Procedure: BONE BIOPSY-HEEL BONE;  Surgeon: Trula Slade, DPM;  Location: [facility];  Service: Podiatry;  Laterality: Left;   CYST EXCISION     lower back   INGUINAL LYMPH NODE BIOPSY Left 07/04/2016   Procedure: INGUINAL LYMPH NODE BIOPSY;  Surgeon: Christene Lye, MD;  Location: [facility];  Service: General;  Laterality: Left;   ORIF FEMUR FRACTURE Left 02/04/2020   Procedure: OPEN REDUCTION INTERNAL FIXATION (ORIF) DISTAL FEMUR FRACTURE;  Surgeon: Shona Needles, MD;  Location: [facility];  Service: Orthopedics;  Laterality: Left;   PORTACATH PLACEMENT N/A 07/22/2016   Procedure: INSERTION PORT-A-CATH;  Surgeon: Christene Lye, MD;  Location: [facility];  Service: General;  Laterality: N/A;   TONSILLECTOMY AND ADENOIDECTOMY     TUBAL LIGATION     WOUND DEBRIDEMENT Left 12/24/2021   Procedure: DEBRIDEMENT WOUND-HEEL ULCER;  Surgeon: Trula Slade, DPM;  Location: [facility];  Service: Podiatry;  Laterality: Left;    Medications Prior to Admission  Medication Sig Dispense Refill Last Dose   amLODipine (NORVASC) 5 MG tablet Take 5 mg by mouth daily.   01/23/2022 at 0811   atorvastatin (LIPITOR) 10 MG tablet Take 10 mg by mouth  at bedtime.    01/22/2022 at 2105   budesonide-formoterol (SYMBICORT) 160-4.5 MCG/ACT inhaler Inhale 2 puffs into the lungs 2 (two) times daily.    01/23/2022 at 0811   buPROPion (WELLBUTRIN XL) 300 MG 24 hr tablet Take 300 mg by mouth daily.   01/23/2022 at 0811   cephALEXin (KEFLEX) 500 MG capsule Take 1 capsule (500 mg total) by mouth 4 (four) times daily for 10 days. 40 capsule 0 01/23/2022 at 0811   clonazePAM (KLONOPIN) 0.5 MG tablet Take 1 tablet (0.5 mg total) by mouth 2 (two) times daily. 10 tablet 0 01/23/2022 at 0811   DULoxetine  (CYMBALTA) 60 MG capsule Take 1 capsule (60 mg total) by mouth every morning. 30 capsule 3 01/23/2022 at 0811   gabapentin (NEURONTIN) 600 MG tablet Take 600 mg by mouth 3 (three) times daily.   01/23/2022 at 0811   lisinopril (ZESTRIL) 20 MG tablet Take 1 tablet (20 mg total) by mouth daily.   01/23/2022 at 0811   Multiple Vitamin (MULTIVITAMIN WITH MINERALS) TABS tablet Take 1 tablet by mouth daily.   01/23/2022 at 0811   MYRBETRIQ 50 MG TB24 tablet TAKE ONE TABLET BY MOUTH ONCE DAILY 30 tablet 0 01/23/2022 at 0811   nystatin powder Apply 1 Application topically 2 (two) times daily.   01/23/2022 at 0811   QUEtiapine Fumarate (SEROQUEL XR) 150 MG 24 hr tablet Take 150 mg by mouth at bedtime.   4 01/22/2022 at 2105   sulfamethoxazole-trimethoprim (BACTRIM DS) 800-160 MG tablet Take 1 tablet by mouth 2 (two) times daily for 10 days. 20 tablet 0 01/23/2022 at 0811   traZODone (DESYREL) 50 MG tablet Take 50 mg by mouth at bedtime.   01/22/2022 at 2105   vortioxetine HBr (TRINTELLIX) 5 MG TABS tablet Take 5 mg by mouth daily.   01/23/2022 at 0811   acetaminophen (TYLENOL) 500 MG tablet Take 1,000 mg by mouth every 8 (eight) hours as needed for moderate pain.   01/18/2022 at 1208   docusate sodium (COLACE) 100 MG capsule Take 1 capsule (100 mg total) by mouth 2 (two) times daily as needed.   prn   hydrALAZINE (APRESOLINE) 50 MG tablet Take 50 mg by mouth 3 (three) times daily. (Patient not taking: Reported on 01/23/2022)   Not Taking   magnesium oxide (MAG-OX) 400 MG tablet Take 400 mg by mouth daily. (Patient not taking: Reported on 01/23/2022)   Not Taking   torsemide (DEMADEX) 20 MG tablet Take 1 tablet (20 mg total) by mouth daily as needed (edema).   prn   Social History   Socioeconomic History   Marital status: Married    Spouse name: Not on file   Number of children: 3   Years of education: Not on file   Highest education level: Not on file  Occupational History   Occupation: disabled  Tobacco Use    Smoking status: Former    Packs/day: 1.00    Years: 20.00    Total pack years: 20.00    Types: Cigarettes    Start date: 04/30/1995    Quit date: 02/03/2016    Years since quitting: 5.9   Smokeless tobacco: Never  Vaping Use   Vaping Use: Some days  Substance and Sexual Activity   Alcohol use: No    Alcohol/week: 0.0 standard drinks of alcohol   Drug use: Yes    Types: Marijuana    Comment: smokes THC occasionally per pt    Sexual activity: Not Currently  Birth control/protection: None  Other Topics Concern   Not on file  Social History Narrative   She is married.    Social Determinants of Health   Financial Resource Strain: Not on file  Food Insecurity: No Food Insecurity (01/10/2022)   Hunger Vital Sign    Worried About Running Out of Food in the Last Year: Never true    Ran Out of Food in the Last Year: Never true  Transportation Needs: Unmet Transportation Needs (01/10/2022)   PRAPARE - Hydrologist (Medical): Yes    Lack of Transportation (Non-Medical): Yes  Physical Activity: Not on file  Stress: Not on file  Social Connections: Not on file  Intimate Partner Violence: Not At Risk (01/10/2022)   Humiliation, Afraid, Rape, and Kick questionnaire    Fear of Current or Ex-Partner: No    Emotionally Abused: No    Physically Abused: No    Sexually Abused: No    Family History  Problem Relation Age of Onset   COPD Mother    Diabetes Mother    Heart failure Mother    Alcohol abuse Father    Kidney disease Father    Kidney failure Father    Arthritis Sister    CAD Maternal Grandmother    Stroke Maternal Grandfather    Arthritis Sister    Mental illness Sister    Arthritis Brother       Review of systems complete and found to be negative unless listed above      PHYSICAL EXAM  General: Well developed, well nourished, patient appears acutely ill  HEENT:  Normocephalic and atramatic Neck:  No JVD.  Lungs: Clear bilaterally to  auscultation and percussion. Heart: Irregular irregular. Normal S1 and S2 without gallops or murmurs.  Abdomen: Bowel sounds are positive, abdomen soft and non-tender  Msk:  Back normal, normal gait. Normal strength and tone for age. Extremities: No clubbing, cyanosis or edema.  Large cellulitic ulcer left anterior leg possible gangrene Neuro: Alert and oriented X 3. Psych:  Good affect, lethargic but responds appropriately  Labs:   Lab Results  Component Value Date   WBC 8.2 01/23/2022   HGB 8.4 (L) 01/23/2022   HCT 26.5 (L) 01/23/2022   MCV 84.4 01/23/2022   PLT 243 01/23/2022    Recent Labs  Lab 01/23/22 0937  NA 122*  K 6.0*  CL 92*  CO2 16*  BUN 84*  CREATININE 7.07*  CALCIUM 8.3*  PROT 6.5  BILITOT 0.8  ALKPHOS 64  ALT 22  AST 21  GLUCOSE 82   Lab Results  Component Value Date   CKTOTAL 156 04/02/2017   CKMB 1.7 02/25/2013   TROPONINI <0.03 06/11/2018    Lab Results  Component Value Date   CHOL 155 02/02/2018   CHOL 151 11/08/2015   Lab Results  Component Value Date   HDL 35 (L) 02/02/2018   HDL 29 (L) 11/08/2015   Lab Results  Component Value Date   LDLCALC 96 02/02/2018   LDLCALC 94 11/08/2015   Lab Results  Component Value Date   TRIG 147 02/02/2018   TRIG 140 11/08/2015   Lab Results  Component Value Date   CHOLHDL 4.4 02/02/2018   CHOLHDL 5.2 (H) 11/08/2015   No results found for: "LDLDIRECT"    Radiology: NM Pulmonary Perfusion  Result Date: 01/23/2022 CLINICAL DATA:  Shortness of breath. EXAM: NUCLEAR MEDICINE PERFUSION LUNG SCAN TECHNIQUE: Perfusion images were obtained in multiple projections after intravenous  injection of radiopharmaceutical. Ventilation scans intentionally deferred if perfusion scan and chest x-ray adequate for interpretation during COVID 19 epidemic. RADIOPHARMACEUTICALS:  4.5 mCi Tc-35mMAA IV COMPARISON:  Chest x-ray 01/23/2022 FINDINGS: No segmental or subsegmental perfusion defects to suggest pulmonary  embolism. IMPRESSION: Negative perfusion study for pulmonary embolism. Electronically Signed   By: PMarijo SanesM.D.   On: 01/23/2022 16:25   DG Foot Complete Left  Result Date: 01/23/2022 CLINICAL DATA:  Evaluate for osteomyelitis. Possible sepsis. Evidence for chronic osteomyelitis in the calcaneus on previous MRI. EXAM: LEFT FOOT - COMPLETE 3+ VIEW COMPARISON:  Left foot 01/17/2022 and foot MRI 01/17/2022 FINDINGS: Again noted is diffuse soft tissue swelling in the left foot. Stable appearance of the calcaneus with calcaneal spurring. No new areas of bone destruction or periosteal reaction involving the calcaneus. Again noted is a linear density along the plantar soft tissues measuring roughly 8 mm and similar to the prior examination. Negative for an acute fracture or dislocation. Alignment of the left foot is stable. IMPRESSION: 1. No acute bone abnormality to the left foot. No radiographic evidence for osteomyelitis. Refer to recent MRI from 01/17/2022. 2. Stable appearance of the linear density along the plantar soft tissues. Findings are suggestive for a foreign body or focal calcification. 3. Diffuse soft tissue swelling. 4. Calcaneal spurring. Electronically Signed   By: AMarkus DaftM.D.   On: 01/23/2022 10:10   DG Chest Port 1 View  Result Date: 01/23/2022 CLINICAL DATA:  Concern for sepsis EXAM: PORTABLE CHEST 1 VIEW COMPARISON:  Chest x-ray dated January 17, 2022 FINDINGS: The heart size and mediastinal contours are within normal limits. Low lung volumes with hypoventilatory changes. No definite focal consolidation. The visualized skeletal structures are unremarkable. IMPRESSION: Low lung volumes with hypoventilatory changes. No definite focal consolidation. Electronically Signed   By: LYetta GlassmanM.D.   On: 01/23/2022 09:56   MR FOOT LEFT WO CONTRAST  Result Date: 01/17/2022 CLINICAL DATA:  Foot swelling, osteomyelitis suspected. Patient with left anterior foot ulcer and heel ulcer  with prior left heel debridement. EXAM: MRI OF THE LEFT FOOT WITHOUT CONTRAST TECHNIQUE: Multiplanar, multisequence MR imaging of the left was performed. No intravenous contrast was administered. COMPARISON:  Radiographs dated January 17, 2022 FINDINGS: Evaluation of multiple sequences is limited due to motion. Bones/Joint/Cartilage There is bone marrow edema of the posterior aspect of the calcaneus adjacent to the deep skin wound inferior to the insertion of the Achilles tendon concerning for ongoing osteomyelitis. Ligaments Limited evaluation due to motion. Muscles and Tendons Increased intrasubstance signal of the plantar muscles suggesting diabetic myopathy/myositis. No fluid collection or abscess. Soft tissues Subcutaneous soft tissue edema about the dorsum and plantar aspect of the foot. No fluid collection or abscess. Deep skin wound about the posterior aspect of the calcaneus inferior to the Achilles insertion. IMPRESSION: 1. Bone marrow edema of the posterior aspect of the calcaneus adjacent to the deep skin wound about the inferior aspect of the Achilles tendon consistent with ongoing chronic osteomyelitis. It is difficult to compare progress from prior examination due to motion and unavailability of comparable sequences. 2. Increased intramuscular signal of the plantar muscles suggesting diabetic myopathy/myositis. 3. Generalized subcutaneous soft tissue edema about the foot without evidence of fluid collection or abscess. Electronically Signed   By: IKeane PoliceD.O.   On: 01/17/2022 22:13   DG Foot 2 Views Left  Result Date: 01/17/2022 CLINICAL DATA:  Left foot wound. EXAM: LEFT FOOT - 2 VIEW COMPARISON:  Radiograph 01/09/2022, heel MRI 12/23/2021 FINDINGS: The bones are diffusely under mineralized. The soft tissue defect posterior to the calcaneus on prior radiograph is less apparent on the current exam. Again seen linear density in the plantar soft tissues measuring 7 mm on the lateral view,  cannot be further localized on the AP view. No erosion, bone destruction, or fracture. There is diffuse soft tissue edema. No soft tissue gas. IMPRESSION: 1. Soft tissue edema. 2. The previous suspected calcaneal osteomyelitis an MRI has no radiographic correlate. 3. Unchanged linear 7 mm density in the plantar soft tissues, may represent foreign body or soft tissue calcification. Electronically Signed   By: Keith Rake M.D.   On: 01/17/2022 18:09   DG Chest 1 View  Result Date: 01/17/2022 CLINICAL DATA:  Left foot wound, possible sepsis EXAM: CHEST  1 VIEW COMPARISON:  03/23/2021 FINDINGS: Single frontal view of the chest demonstrates a stable cardiac silhouette. Chronic left suprahilar scarring. No airspace disease, effusion, or pneumothorax. No acute bony abnormalities. IMPRESSION: 1. No acute intrathoracic process. Electronically Signed   By: Randa Ngo M.D.   On: 01/17/2022 18:06   DG Foot Complete Left  Result Date: 01/10/2022 CLINICAL DATA:  Pain, bruising in left foot EXAM: LEFT FOOT - COMPLETE 3+ VIEW COMPARISON:  None Available. FINDINGS: Plantar calcaneal spur. No acute bony abnormality. Specifically, no fracture, subluxation, or dislocation. Small linear density seen in the plantar soft tissues of unknown etiology. This could reflect a small foreign body or soft tissue calcification. IMPRESSION: No acute bony abnormality. Small linear density in the plantar soft tissues which could reflect a small foreign body or soft tissue calcification. Electronically Signed   By: Rolm Baptise M.D.   On: 01/10/2022 00:14    EKG: Atrial fibrillation rate of 45 nonspecific ST-T wave changes  ASSESSMENT AND PLAN:  Sepsis Hypotension Atrial fibrillation Acute renal renal failure Cellulitis left leg Obesity COPD Generalized weakness B-cell lymphoma Shortness of breath ADHD depression anxiety Hyponatremia Hyperkalemia . Plan Agree with intensive level ICU care Broad-spectrum antibiotic  therapy for probable sepsis Fluid hydration for renal insufficiency hypotension Consider pressor therapy for blood pressure support Agree with echocardiogram for assessment of left ventricular function Acute on chronic renal insufficiency we will consider nephrology input Consider short-term dialysis therapy because of acute renal failure Elevated BNP suggest heart failure with recommend gentle diuresis with dialysis Do not recommend any invasive cardiac procedures at this stage  Signed: Yolonda Kida MD, 01/23/2022, 5:18 PM

## 2022-01-23 NOTE — Procedures (Signed)
Central Venous Catheter Insertion Procedure Note  [name]  [id number]  09-11-1950  Date:01/23/22  Time:6:17 PM   Provider Performing:Kemora Pinard D Dewaine Conger   Procedure: Insertion of Non-tunneled Central Venous Catheter(36556)with US guidance ([id number])    Indication(s) Medication administration, Difficult access, and Hemodialysis  Consent Risks of the procedure as well as the alternatives and risks of each were explained to the patient and/or caregiver.  Consent for the procedure was obtained and is signed in the bedside chart  Anesthesia Topical only with 1% lidocaine   Timeout Verified patient identification, verified procedure, site/side was marked, verified correct patient position, special equipment/implants available, medications/allergies/relevant history reviewed, required imaging and test results available.  Sterile Technique Maximal sterile technique including full sterile barrier drape, hand hygiene, sterile gown, sterile gloves, mask, hair covering, sterile ultrasound probe cover (if used).  Procedure Description Area of catheter insertion was cleaned with chlorhexidine and draped in sterile fashion.   With real-time ultrasound guidance a HD catheter was placed into the left internal jugular vein.  Nonpulsatile blood flow and easy flushing noted in all ports.  The catheter was sutured in place and sterile dressing applied.  Complications/Tolerance None; patient tolerated the procedure well. Chest X-ray is ordered to verify placement for internal jugular or subclavian cannulation.  Chest x-ray is not ordered for femoral cannulation.  EBL Minimal  Specimen(s) None   Line secured at the 19 cm mark. BIOPATCH applied to the insertion site.   Darel Hong, AGACNP-BC [facility] Prefer epic messenger for cross cover needs If after hours, please call E-link

## 2022-01-23 NOTE — ED Provider Notes (Signed)
[facility] Provider Note    Event Date/Time   First MD Initiated Contact with Patient 01/23/22 [phone]     (approximate)   History   Possible Sepsis    HPI  [name] is a 71 y.o. female  with pmh history of depression, anxiety, legally blind, COPD on oxygen, diabetes mellitus, pancreatic insufficiency, subdural hematoma status post craniotomy in July 2023 who presents with concern for possible sepsis.   Patient has history of a chronic left foot wound.  Patient seen by podiatry on 9/14 for left heel ulceration and anterior ankle erythema.  At that time it was recommended that she get several days of IV antibiotics but no surgical treatment necessary.  Patient was discharged on 9/16.  Turn to the ED on 9/21 and had an MRI that showed chronic osteomyelitis of the calcaneus and an increased intramuscular signal of the plantar muscle suggesting diabetic myopathy/myositis as well as generalized subcu edema without fluid collection or abscess.  She was discharged on treatment Keflex.  Presents to the emergency department today because she was hypotensive and her facility was concerned about sepsis.  Patient Dors is ongoing pain in the left leg which she says is not new she denies abdominal pain nausea vomiting diarrhea shortness of breath chest pain cough or urinary symptoms.  She has a chronic indwelling Foley.  She was hypotensive for EMS received 300 cc of fluid.     Past Medical History:  Diagnosis Date   Abdominal aortic atherosclerosis ([place]) 11/11/2016   ADHD    Anxiety    COPD (chronic obstructive pulmonary disease) (HCC)    Depression    major depressive   Dyspnea    doe   Edema    left leg   Follicular lymphoma (HCC)    B Cell   Follicular lymphoma grade II (HCC)    Hypertension    Hypotension    idiopathic   Kyphoscoliosis and scoliosis 11/26/2011   Morbid obesity ([place]) 01/05/2016   Multiple sclerosis (HCC)    Multiple sclerosis ([place])    1980's    Neuromuscular disorder ([place])    Obstructive and reflux uropathy    foley   Pain    atypical facial   Peripheral vascular disease of lower extremity with ulceration ([place]) 11/08/2015   Skin ulcer ([place]) 11/08/2015   Weakness    generalized. has MS    Patient Active Problem List   Diagnosis Date Noted   Foot ulcer ([place]) 01/10/2022   Ulcer of left heel ([place])    Non healing left heel wound 12/22/2021   Femur fracture, left ([place]) 02/04/2020   Left leg weakness 01/26/2020   COPD (chronic obstructive pulmonary disease) ([place]) 01/01/2020   Foley catheter in place on admission 11/19/2019   Neuropathy    Hyperlipidemia    Chronic kidney disease (CKD), stage III (moderate) ([place]) 04/20/2019   Ovarian mass, left 01/27/2019   Depression 05/13/2018   Recurrent cellulitis of lower extremity 06/25/2017   Medication monitoring encounter 06/05/2017   Cellulitis of left lower leg 04/25/2017   Adjustment disorder with mixed disturbance of emotions and conduct 04/23/2017   Left ovarian cyst 12/09/2016   Abdominal aortic atherosclerosis ([place]) 11/11/2016   Obstructive sleep apnea 18/56/3149   Follicular lymphoma of intra-abdominal lymph nodes ([place]) 08/06/2016   Obesity, Class III, BMI 40-49.9 (morbid obesity) ([place]) 01/05/2016   Skin ulcer ([place]) 11/08/2015   Peripheral vascular disease of lower extremity with ulceration ([place]) 11/08/2015   CKD (  chronic kidney disease) stage 3, GFR 30-59 ml/min (HCC) 11/08/2015   Constipation due to pain medication 01/31/2015   Obstructive sleep apnea of adult 01/13/2015   Atonic neurogenic bladder 10/24/2014   COPD with bronchial hyperresponsiveness ([place]) 10/02/2013   Major depressive disorder, recurrent, in partial remission ([place]) 10/02/2013   Essential hypertension 10/02/2013   Multiple sclerosis ([place]) 10/02/2013   Absence of bladder continence 09/25/2012   Kyphoscoliosis and scoliosis 11/26/2011     Physical Exam  Triage Vital Signs: ED Triage Vitals  Enc  Vitals Group     BP 01/23/22 0924 (!) 87/42     Pulse Rate 01/23/22 0924 (!) 43     Resp 01/23/22 0924 19     Temp 01/23/22 0924 98.3 F (36.8 C)     Temp Source 01/23/22 0924 Oral     SpO2 01/23/22 0924 95 %     Weight --      Height --      Head Circumference --      Peak Flow --      Pain Score 01/23/22 0925 4     Pain Loc --      Pain Edu? --      Excl. in [place]? --     Most recent vital signs: Vitals:   01/23/22 1242 01/23/22 1243  BP:    Pulse:  91  Resp: 12   Temp:    SpO2: 100%      General: Awake, patient appears ill CV:  Good peripheral perfusion.  Trace pitting edema Resp:  Patient is tachypneic with expiratory wheezing audible from across the room Abd:  No distention.  Obese abdomen, no tenderness Neuro:             Awake, Alert, Oriented x 3  Other:  Left lower extremity has an area of ulceration over the anterior ankle with erythema extending up the calf there is foul odor, there is also ulceration with eschar over the heel      ED Results / Procedures / Treatments  Labs (all labs ordered are listed, but only abnormal results are displayed) Labs Reviewed  COMPREHENSIVE METABOLIC PANEL - Abnormal; Notable for the following components:      Result Value   Sodium 122 (*)    Potassium 6.0 (*)    Chloride 92 (*)    CO2 16 (*)    BUN 84 (*)    Creatinine, Ser 7.07 (*)    Calcium 8.3 (*)    Albumin 3.3 (*)    GFR, Estimated 6 (*)    All other components within normal limits  CBC WITH DIFFERENTIAL/PLATELET - Abnormal; Notable for the following components:   RBC 3.14 (*)    Hemoglobin 8.4 (*)    HCT 26.5 (*)    Lymphs Abs 0.4 (*)    All other components within normal limits  PROTIME-INR - Abnormal; Notable for the following components:   Prothrombin Time 15.9 (*)    INR 1.3 (*)    All other components within normal limits  BRAIN NATRIURETIC PEPTIDE - Abnormal; Notable for the following components:   B Natriuretic Peptide 1,229.6 (*)    All other  components within normal limits  MAGNESIUM - Abnormal; Notable for the following components:   Magnesium 2.5 (*)    All other components within normal limits  CULTURE, BLOOD (ROUTINE X 2)  CULTURE, BLOOD (ROUTINE X 2)  URINE CULTURE  LACTIC ACID, PLASMA  APTT  PROCALCITONIN  URINALYSIS, COMPLETE (UACMP) WITH  MICROSCOPIC  TROPONIN I (HIGH SENSITIVITY)     EKG  EKG shows junctional bradycardia with PACs, anterior Q waves, low voltage   RADIOLOGY I reviewed and interpreted the CXR which does not show any acute cardiopulmonary process    PROCEDURES:  Critical Care performed: Yes, see critical care procedure note(s)  .1-3 Lead EKG Interpretation  Performed by: Rada Hay, MD Authorized by: Rada Hay, MD     Interpretation: abnormal     ECG rate assessment: bradycardic     Rhythm: other rhythm     Ectopy: none     Conduction: normal   .Critical Care  Performed by: Rada Hay, MD Authorized by: Rada Hay, MD   Critical care provider statement:    Critical care time (minutes):  30   Critical care was time spent personally by me on the following activities:  Development of treatment plan with patient or surrogate, discussions with consultants, evaluation of patient's response to treatment, examination of patient, ordering and review of laboratory studies, ordering and review of radiographic studies, ordering and performing treatments and interventions, pulse oximetry, re-evaluation of patient's condition and review of old charts   The patient is on the cardiac monitor to evaluate for evidence of arrhythmia and/or significant heart rate changes.   MEDICATIONS ORDERED IN ED: Medications  calcium gluconate 1 g/ 50 mL sodium chloride IVPB (1,000 mg Intravenous New Bag/Given 01/23/22 1316)  norepinephrine (LEVOPHED) '4mg'$  in 264m (0.016 mg/mL) premix infusion (3 mcg/min Intravenous Rate/Dose Change 01/23/22 1318)  lactated ringers bolus 1,000 mL  (0 mLs Intravenous Stopped 01/23/22 1035)  vancomycin (VANCOREADY) IVPB 2000 mg/400 mL (2,000 mg Intravenous New Bag/Given 01/23/22 1036)  piperacillin-tazobactam (ZOSYN) IVPB 3.375 g (0 g Intravenous Stopped 01/23/22 1050)  sodium chloride 0.9 % bolus 1,000 mL ( Intravenous Rate/Dose Change 01/23/22 1259)  insulin aspart (novoLOG) injection 5 Units (5 Units Intravenous Given 01/23/22 1126)  dextrose 50 % solution 25 g (25 g Intravenous Given 01/23/22 1126)  ipratropium-albuterol (DUONEB) 0.5-2.5 (3) MG/3ML nebulizer solution 3 mL (3 mLs Nebulization Given 01/23/22 1144)  ipratropium-albuterol (DUONEB) 0.5-2.5 (3) MG/3ML nebulizer solution 3 mL (3 mLs Nebulization Given 01/23/22 1143)     IMPRESSION / MDM / ASSESSMENT AND PLAN / ED COURSE  I reviewed the triage vital signs and the nursing notes.                              Patient's presentation is most consistent with acute presentation with potential threat to life or bodily function.  Differential diagnosis includes, but is not limited to, septic shock osteomyelitis, cellulitis, abscess, hypovolemic shock, renal failure, electrolyte abnormality  The patient is a 71year old female who presents because of low blood pressure.  She has been dealing with a chronic left foot wound for some time as an had a recent admission seen in the ED several days ago prescribed Keflex and Bactrim.  Presents from [place] with hypotension 80s over 50s on arrival.  Patient's foot does look cellulitic there is ulceration on the dorsal part of the ankle as well as the heel.  There is an odor.  Patient is also wheezing.  She is alert and oriented.  Patient given 2 L of fluids which she responded to.  She is notably bradycardic.  EKG shows somewhat unclear rhythm suspect junctional bradycardia with some PACs does not look like A-fib do not see a clear P waves.  She is not  hypoxic.  Labs are notable for significant AKI with creatinine of 7 and potassium of 6.  We will treat with  fluid insulin and glucose.  He has no leukocytosis.  Lactate is normal.  She is also hyponatremic with sodium 122.  She does not have much in the way of urine in her bag.  COVID are bothering her thank and Zosyn.  Will require admission.   Patient persistently bradycardic with heart rates down to the high 30s.  Repeat EKG again showing what looks to me like a junctional bradycardia.  I did discuss with Dr. Charlestine Night with cardiology who agrees it is likely junctional there is some sinus beats.  He recommended treating her supportively and fixing the renal failure no indication for pacemaker currently.  We did have difficulty getting accurate blood pressures in the patient.  Large cuff placed on the upper arm is reading 70s over 60s.  Small cuff placed on the forearm readings 60s over 50s.  Patient does have palpable radial pulse.  She is having some mild wheezing and on bedside ultrasound she does have B-lines cardiac function overall looks normal no pericardial effusion.  Was unable to visualize the IVC given habitus.  Added on BNP and this is elevated 1500 so I will stop fluids and start pressors.  Patient had minimal urine output in her bag since coming here.   FINAL CLINICAL IMPRESSION(S) / ED DIAGNOSES   Final diagnoses:  Acute renal failure, unspecified acute renal failure type (HCC)  Hyponatremia  Ulcer of left foot, unspecified ulcer stage (HCC)  Cellulitis, unspecified cellulitis site     Rx / DC Orders   ED Discharge Orders     None        Note:  This document was prepared using Dragon voice recognition software and may include unintentional dictation errors.   Rada Hay, MD 01/23/22 1322

## 2022-01-23 NOTE — ED Notes (Signed)
Neighboring RN agreed to try for [place] IV soon. [place] notified.

## 2022-01-23 NOTE — ED Notes (Signed)
Previous LR bolus still running and has about 300cc left. Will start NS bolus once LR finished.

## 2022-01-24 LAB — ECHOCARDIOGRAM COMPLETE
Height: 64 in
S' Lateral: 3.4 cm
Weight: 4751.35 oz

## 2022-01-24 LAB — BASIC METABOLIC PANEL
Anion gap: 12 (ref 5–15)
Anion gap: 12 (ref 5–15)
BUN: 65 mg/dL — ABNORMAL HIGH (ref 8–23)
BUN: 70 mg/dL — ABNORMAL HIGH (ref 8–23)
CO2: 20 mmol/L — ABNORMAL LOW (ref 22–32)
CO2: 20 mmol/L — ABNORMAL LOW (ref 22–32)
Calcium: 8 mg/dL — ABNORMAL LOW (ref 8.9–10.3)
Calcium: 8.3 mg/dL — ABNORMAL LOW (ref 8.9–10.3)
Chloride: 93 mmol/L — ABNORMAL LOW (ref 98–111)
Chloride: 95 mmol/L — ABNORMAL LOW (ref 98–111)
Creatinine, Ser: 5.48 mg/dL — ABNORMAL HIGH (ref 0.44–1.00)
Creatinine, Ser: 5.97 mg/dL — ABNORMAL HIGH (ref 0.44–1.00)
GFR, Estimated: 8 mL/min — ABNORMAL LOW (ref >60)
GFR, Estimated: 7 mL/min — ABNORMAL LOW (ref >60)
Glucose, Bld: 120 mg/dL — ABNORMAL HIGH (ref 70–99)
Glucose, Bld: 125 mg/dL — ABNORMAL HIGH (ref 70–99)
Potassium: 5.1 mmol/L (ref 3.5–5.1)
Potassium: 5.4 mmol/L — ABNORMAL HIGH (ref 3.5–5.1)
Sodium: 125 mmol/L — ABNORMAL LOW (ref 135–145)
Sodium: 127 mmol/L — ABNORMAL LOW (ref 135–145)

## 2022-01-24 LAB — CBC
HCT: 26.6 % — ABNORMAL LOW (ref 36.0–46.0)
Hemoglobin: 8.5 g/dL — ABNORMAL LOW (ref 12.0–15.0)
MCH: 26.6 pg (ref 26.0–34.0)
MCHC: 32 g/dL (ref 30.0–36.0)
MCV: 83.1 fL (ref 80.0–100.0)
Platelets: 306 10*3/uL (ref 150–400)
RBC: 3.2 MIL/uL — ABNORMAL LOW (ref 3.87–5.11)
RDW: 14.3 % (ref 11.5–15.5)
WBC: 11.4 10*3/uL — ABNORMAL HIGH (ref 4.0–10.5)
nRBC: 0 % (ref 0.0–0.2)

## 2022-01-24 LAB — PROCALCITONIN: Procalcitonin: 0.22 ng/mL

## 2022-01-24 LAB — HEPATITIS C ANTIBODY: HCV Ab: NONREACTIVE

## 2022-01-24 LAB — HEPATITIS B CORE ANTIBODY, TOTAL: Hep B Core Total Ab: NONREACTIVE

## 2022-01-24 LAB — MAGNESIUM
Magnesium: 2.2 mg/dL (ref 1.7–2.4)
Magnesium: 2.3 mg/dL (ref 1.7–2.4)

## 2022-01-24 LAB — TSH: TSH: 2.148 u[IU]/mL (ref 0.350–4.500)

## 2022-01-24 LAB — GLUCOSE, CAPILLARY: Glucose-Capillary: 102 mg/dL — ABNORMAL HIGH (ref 70–99)

## 2022-01-24 LAB — PHOSPHORUS: Phosphorus: 7.4 mg/dL — ABNORMAL HIGH (ref 2.5–4.6)

## 2022-01-24 LAB — HEPATITIS B SURFACE ANTIBODY,QUALITATIVE: Hep B S Ab: NONREACTIVE

## 2022-01-24 LAB — HEPATITIS B SURFACE ANTIGEN: Hepatitis B Surface Ag: NONREACTIVE

## 2022-01-24 MED ORDER — HEPARIN SODIUM (PORCINE) 1000 UNIT/ML IJ SOLN
INTRAMUSCULAR | Status: AC
  Filled 2022-01-24: qty 10

## 2022-01-24 MED ORDER — NEPRO/CARBSTEADY PO LIQD
237.0000 mL | Freq: Three times a day (TID) | ORAL | Status: DC
  Administered 2022-01-24 – 2022-02-06 (×15): 237 mL via ORAL

## 2022-01-24 MED ORDER — SODIUM CHLORIDE 0.9 % IV SOLN
1.0000 g | INTRAVENOUS | Status: DC
  Administered 2022-01-24 – 2022-01-27 (×4): 1 g via INTRAVENOUS
  Filled 2022-01-24 (×4): qty 1

## 2022-01-24 MED ORDER — VANCOMYCIN HCL IN DEXTROSE 1-5 GM/200ML-% IV SOLN
1000.0000 mg | INTRAVENOUS | Status: DC | PRN
  Administered 2022-01-25: 1000 mg via INTRAVENOUS
  Filled 2022-01-24: qty 200

## 2022-01-24 MED ORDER — VITAMIN C 500 MG PO TABS
500.0000 mg | ORAL_TABLET | Freq: Two times a day (BID) | ORAL | Status: DC
  Administered 2022-01-24 – 2022-02-06 (×26): 500 mg via ORAL
  Filled 2022-01-24 (×26): qty 1

## 2022-01-24 MED ORDER — ZINC SULFATE 220 (50 ZN) MG PO CAPS
220.0000 mg | ORAL_CAPSULE | Freq: Every day | ORAL | Status: DC
  Administered 2022-01-25 – 2022-02-06 (×13): 220 mg via ORAL
  Filled 2022-01-24 (×13): qty 1

## 2022-01-24 MED ORDER — RENA-VITE PO TABS
1.0000 | ORAL_TABLET | Freq: Every day | ORAL | Status: DC
  Administered 2022-01-24 – 2022-02-05 (×13): 1 via ORAL
  Filled 2022-01-24 (×13): qty 1

## 2022-01-24 MED ORDER — METRONIDAZOLE 500 MG/100ML IV SOLN
500.0000 mg | Freq: Two times a day (BID) | INTRAVENOUS | Status: DC
  Administered 2022-01-24 – 2022-01-29 (×12): 500 mg via INTRAVENOUS
  Filled 2022-01-24 (×12): qty 100

## 2022-01-24 MED ORDER — MIDODRINE HCL 5 MG PO TABS
10.0000 mg | ORAL_TABLET | Freq: Three times a day (TID) | ORAL | Status: DC
  Administered 2022-01-24 – 2022-01-26 (×8): 10 mg via ORAL
  Filled 2022-01-24 (×9): qty 2

## 2022-01-24 MED ORDER — BENZONATATE 100 MG PO CAPS
100.0000 mg | ORAL_CAPSULE | Freq: Three times a day (TID) | ORAL | Status: DC | PRN
  Administered 2022-01-24 – 2022-01-25 (×3): 100 mg via ORAL
  Filled 2022-01-24 (×3): qty 1

## 2022-01-24 NOTE — Plan of Care (Signed)
  Problem: Education: Goal: Knowledge of General Education information will improve Description: Including pain rating scale, medication(s)/side effects and non-pharmacologic comfort measures Outcome: Progressing   Problem: Clinical Measurements: Goal: Ability to maintain clinical measurements within normal limits will improve Outcome: Progressing Goal: Will remain free from infection Outcome: Progressing   

## 2022-01-24 NOTE — Consult Note (Signed)
[facility]  CONSULT NOTE    Date: 01/24/2022                  Patient Name:  [name]  MRN: [id number]  DOB: 1951-02-15  Age / Sex: 71 y.o., female         PCP: Kirk Ruths, MD                 Service Requesting Consult: Critical care                 Reason for Consult: Pulmonary edema            History of Present Illness: [name] is a 71 y.o.  female with past medical conditions including COPD, depression, multiple sclerosis, peripheral vascular disease, hypertension, and ADHD, who was admitted to [facility] on 01/23/2022 for Hyponatremia [E87.1] Septic shock ([place]) [A41.9, R65.21] Acute renal failure, unspecified acute renal failure type ([place]) [N17.9] Cellulitis, unspecified cellulitis site [L03.90] Ulcer of left foot, unspecified ulcer stage [facility]) [L97.529]  Patient presented to the emergency department for possible sepsis.  It appears patient was recently discharged and had received multiple days of IV antibiotics for treatment of the left heel wound.  Recent MRI showed chronic osteomyelitis and intramuscular signal of the plantar muscle.  Patient was discharged on Keflex.  Patient now returns to the emergency department with hypotension from her facility.  Patient was evaluated and transferred to ICU.  Patient is seen and evaluated at bedside in ICU.  Currently alert and oriented.  3 L nasal cannula.  Currently on levo.  Denies nausea and vomiting.  Currently denies shortness of breath.  Reports discomfort from left foot.  Chronic Foley catheter in place.   Labs on ED arrival include sodium 122, potassium 6.0, serum bicarb 16, BUN 84, creatinine 7.07, calcium 8.3, magnesium 2.5, albumin 3.3, BNP greater than 1200, and hemoglobin 8.4.  Chest x-ray negative.Nuclear med study negative for pulmonary embolism.  Echo pending  Medications: Outpatient medications: Medications Prior to Admission  Medication Sig Dispense Refill Last Dose    amLODipine (NORVASC) 5 MG tablet Take 5 mg by mouth daily.   01/23/2022 at 0811   atorvastatin (LIPITOR) 10 MG tablet Take 10 mg by mouth at bedtime.    01/22/2022 at 2105   budesonide-formoterol (SYMBICORT) 160-4.5 MCG/ACT inhaler Inhale 2 puffs into the lungs 2 (two) times daily.    01/23/2022 at 0811   buPROPion (WELLBUTRIN XL) 300 MG 24 hr tablet Take 300 mg by mouth daily.   01/23/2022 at 0811   cephALEXin (KEFLEX) 500 MG capsule Take 1 capsule (500 mg total) by mouth 4 (four) times daily for 10 days. 40 capsule 0 01/23/2022 at 0811   clonazePAM (KLONOPIN) 0.5 MG tablet Take 1 tablet (0.5 mg total) by mouth 2 (two) times daily. 10 tablet 0 01/23/2022 at 0811   DULoxetine (CYMBALTA) 60 MG capsule Take 1 capsule (60 mg total) by mouth every morning. 30 capsule 3 01/23/2022 at 0811   gabapentin (NEURONTIN) 600 MG tablet Take 600 mg by mouth 3 (three) times daily.   01/23/2022 at 0811   lisinopril (ZESTRIL) 20 MG tablet Take 1 tablet (20 mg total) by mouth daily.   01/23/2022 at 0811   Multiple Vitamin (MULTIVITAMIN WITH MINERALS) TABS tablet Take 1 tablet by mouth daily.   01/23/2022 at 0811   MYRBETRIQ 50 MG TB24 tablet TAKE ONE TABLET BY MOUTH ONCE DAILY 30 tablet 0 01/23/2022 at  5053   nystatin powder Apply 1 Application topically 2 (two) times daily.   01/23/2022 at 0811   QUEtiapine Fumarate (SEROQUEL XR) 150 MG 24 hr tablet Take 150 mg by mouth at bedtime.   4 01/22/2022 at 2105   sulfamethoxazole-trimethoprim (BACTRIM DS) 800-160 MG tablet Take 1 tablet by mouth 2 (two) times daily for 10 days. 20 tablet 0 01/23/2022 at 0811   traZODone (DESYREL) 50 MG tablet Take 50 mg by mouth at bedtime.   01/22/2022 at 2105   vortioxetine HBr (TRINTELLIX) 5 MG TABS tablet Take 5 mg by mouth daily.   01/23/2022 at 0811   acetaminophen (TYLENOL) 500 MG tablet Take 1,000 mg by mouth every 8 (eight) hours as needed for moderate pain.   01/18/2022 at 1208   docusate sodium (COLACE) 100 MG capsule Take 1 capsule (100 mg  total) by mouth 2 (two) times daily as needed.   prn   hydrALAZINE (APRESOLINE) 50 MG tablet Take 50 mg by mouth 3 (three) times daily. (Patient not taking: Reported on 01/23/2022)   Not Taking   magnesium oxide (MAG-OX) 400 MG tablet Take 400 mg by mouth daily. (Patient not taking: Reported on 01/23/2022)   Not Taking   torsemide (DEMADEX) 20 MG tablet Take 1 tablet (20 mg total) by mouth daily as needed (edema).   prn    Current medications: Current Facility-Administered Medications  Medication Dose Route Frequency Provider Last Rate Last Admin   alteplase (CATHFLO ACTIVASE) injection 2 mg  2 mg Intracatheter Once PRN Colon Flattery, NP       anticoagulant sodium citrate solution 5 mL  5 mL Intracatheter PRN Colon Flattery, NP       Chlorhexidine Gluconate Cloth 2 % PADS 6 each  6 each Topical Q0600 Ottie Glazier, MD   6 each at 01/23/22 1640   Chlorhexidine Gluconate Cloth 2 % PADS 6 each  6 each Topical Q0600 Colon Flattery, NP       heparin injection 1,000 Units  1,000 Units Intracatheter PRN Colon Flattery, NP       heparin injection 5,000 Units  5,000 Units Subcutaneous Q8H Ottie Glazier, MD   5,000 Units at 01/24/22 0617   ipratropium-albuterol (DUONEB) 0.5-2.5 (3) MG/3ML nebulizer solution 3 mL  3 mL Nebulization Q6H Darel Hong D, NP   3 mL at 01/24/22 0743   lidocaine (PF) (XYLOCAINE) 1 % injection 5 mL  5 mL Intradermal PRN Colon Flattery, NP       lidocaine-prilocaine (EMLA) cream 1 Application  1 Application Topical PRN Colon Flattery, NP       metroNIDAZOLE (FLAGYL) IVPB 500 mg  500 mg Intravenous Q12H Darel Hong D, NP       midodrine (PROAMATINE) tablet 10 mg  10 mg Oral TID WC Ottie Glazier, MD       norepinephrine (LEVOPHED) '4mg'$  in 243m (0.016 mg/mL) premix infusion  0-40 mcg/min Intravenous Continuous MRada Hay MD 22.5 mL/hr at 01/24/22 0600 6 mcg/min at 01/24/22 0600   pentafluoroprop-tetrafluoroeth (GEBAUERS) aerosol 1 Application  1  Application Topical PRN BColon Flattery NP       polyethylene glycol (MIRALAX / GLYCOLAX) packet 17 g  17 g Oral Daily PRN AOttie Glazier MD       Facility-Administered Medications Ordered in Other Encounters  Medication Dose Route Frequency Provider Last Rate Last Admin   heparin lock flush 100 unit/mL  500 Units Intracatheter Once PRN RSindy Guadeloupe MD  Allergies: No Known Allergies    Past Medical History: Past Medical History:  Diagnosis Date   Abdominal aortic atherosclerosis ([place]) 11/11/2016   ADHD    Anxiety    COPD (chronic obstructive pulmonary disease) (HCC)    Depression    major depressive   Dyspnea    doe   Edema    left leg   Follicular lymphoma (HCC)    B Cell   Follicular lymphoma grade II (HCC)    Hypertension    Hypotension    idiopathic   Kyphoscoliosis and scoliosis 11/26/2011   Morbid obesity ([place]) 01/05/2016   Multiple sclerosis (HCC)    Multiple sclerosis (HCC)    1980's   Neuromuscular disorder (HCC)    Obstructive and reflux uropathy    foley   Pain    atypical facial   Peripheral vascular disease of lower extremity with ulceration ([place]) 11/08/2015   Skin ulcer ([place]) 11/08/2015   Weakness    generalized. has MS     Past Surgical History: Past Surgical History:  Procedure Laterality Date   BACK SURGERY N/A 2002   BONE BIOPSY Left 12/24/2021   Procedure: BONE BIOPSY-HEEL BONE;  Surgeon: Trula Slade, DPM;  Location: [facility];  Service: Podiatry;  Laterality: Left;   CYST EXCISION     lower back   INGUINAL LYMPH NODE BIOPSY Left 07/04/2016   Procedure: INGUINAL LYMPH NODE BIOPSY;  Surgeon: Christene Lye, MD;  Location: [facility];  Service: General;  Laterality: Left;   ORIF FEMUR FRACTURE Left 02/04/2020   Procedure: OPEN REDUCTION INTERNAL FIXATION (ORIF) DISTAL FEMUR FRACTURE;  Surgeon: Shona Needles, MD;  Location: [place];  Service: Orthopedics;  Laterality: Left;   PORTACATH PLACEMENT N/A 07/22/2016   Procedure:  INSERTION PORT-A-CATH;  Surgeon: Christene Lye, MD;  Location: [facility];  Service: General;  Laterality: N/A;   TONSILLECTOMY AND ADENOIDECTOMY     TUBAL LIGATION     WOUND DEBRIDEMENT Left 12/24/2021   Procedure: DEBRIDEMENT WOUND-HEEL ULCER;  Surgeon: Trula Slade, DPM;  Location: [facility];  Service: Podiatry;  Laterality: Left;     Family History: Family History  Problem Relation Age of Onset   COPD Mother    Diabetes Mother    Heart failure Mother    Alcohol abuse Father    Kidney disease Father    Kidney failure Father    Arthritis Sister    CAD Maternal Grandmother    Stroke Maternal Grandfather    Arthritis Sister    Mental illness Sister    Arthritis Brother      Social History: Social History   Socioeconomic History   Marital status: Married    Spouse name: Not on file   Number of children: 3   Years of education: Not on file   Highest education level: Not on file  Occupational History   Occupation: disabled  Tobacco Use   Smoking status: Former    Packs/day: 1.00    Years: 20.00    Total pack years: 20.00    Types: Cigarettes    Start date: 04/30/1995    Quit date: 02/03/2016    Years since quitting: 5.9   Smokeless tobacco: Never  Vaping Use   Vaping Use: Some days  Substance and Sexual Activity   Alcohol use: No    Alcohol/week: 0.0 standard drinks of alcohol   Drug use: Yes    Types: Marijuana    Comment: smokes THC occasionally per pt  Sexual activity: Not Currently    Birth control/protection: None  Other Topics Concern   Not on file  Social History Narrative   She is married.    Social Determinants of Health   Financial Resource Strain: Not on file  Food Insecurity: No Food Insecurity (01/10/2022)   Hunger Vital Sign    Worried About Running Out of Food in the Last Year: Never true    Ran Out of Food in the Last Year: Never true  Transportation Needs: Unmet Transportation Needs (01/10/2022)   PRAPARE - Armed forces logistics/support/administrative officer (Medical): Yes    Lack of Transportation (Non-Medical): Yes  Physical Activity: Not on file  Stress: Not on file  Social Connections: Not on file  Intimate Partner Violence: Not At Risk (01/10/2022)   Humiliation, Afraid, Rape, and Kick questionnaire    Fear of Current or Ex-Partner: No    Emotionally Abused: No    Physically Abused: No    Sexually Abused: No     Review of Systems: Review of Systems  Constitutional:  Negative for chills, fever and malaise/fatigue.  HENT:  Negative for congestion, sore throat and tinnitus.   Eyes:  Negative for blurred vision and redness.  Respiratory:  Negative for cough, shortness of breath and wheezing.   Cardiovascular:  Negative for chest pain, palpitations, claudication and leg swelling.  Gastrointestinal:  Negative for abdominal pain, blood in stool, diarrhea, nausea and vomiting.  Genitourinary:  Negative for flank pain, frequency and hematuria.  Musculoskeletal:  Negative for back pain, falls and myalgias.  Skin:  Negative for rash.  Neurological:  Positive for weakness. Negative for dizziness and headaches.  Endo/Heme/Allergies:  Does not bruise/bleed easily.  Psychiatric/Behavioral:  Negative for depression. The patient is not nervous/anxious and does not have insomnia.     Vital Signs: Blood pressure (!) 96/52, pulse (!) 43, temperature 98.1 F (36.7 C), temperature source Oral, resp. rate 17, height '5\' 4"'$  (1.626 m), weight 132.1 kg, SpO2 95 %.  Weight trends: Filed Weights   01/23/22 1635 01/24/22 0500  Weight: 134.7 kg 132.1 kg    Physical Exam: General: NAD  Head: Normocephalic, atraumatic. Moist oral mucosal membranes  Eyes: Anicteric  Lungs:  Basilar crackles normal effort, [place] O2  Heart: Regular rate and rhythm  Abdomen:  Soft, nontender, obese  Extremities: No peripheral edema.  Neurologic: Nonfocal, moving all four extremities  Skin: No lesions  Access: Left IJ temp cath     Lab  results: Basic Metabolic Panel: Recent Labs  Lab 01/23/22 0937 01/23/22 1038 01/23/22 2255 01/24/22 0533  NA 122*  --  125* 127*  K 6.0*  --  5.1 5.4*  CL 92*  --  93* 95*  CO2 16*  --  20* 20*  GLUCOSE 82  --  120* 125*  BUN 84*  --  65* 70*  CREATININE 7.07*  --  5.48* 5.97*  CALCIUM 8.3*  --  8.0* 8.3*  MG  --  2.5* 2.2 2.3  PHOS  --   --   --  7.4*    Liver Function Tests: Recent Labs  Lab 01/17/22 1720 01/23/22 0937  AST 17 21  ALT 15 22  ALKPHOS 66 64  BILITOT 0.6 0.8  PROT 7.0 6.5  ALBUMIN 3.6 3.3*   No results for input(s): "LIPASE", "AMYLASE" in the last 168 hours. No results for input(s): "AMMONIA" in the last 168 hours.  CBC: Recent Labs  Lab 01/17/22 1720 01/23/22 0937 01/24/22  0533  WBC 11.9* 8.2 11.4*  NEUTROABS 9.9* 7.1  --   HGB 10.4* 8.4* 8.5*  HCT 32.7* 26.5* 26.6*  MCV 86.5 84.4 83.1  PLT 257 243 306    Cardiac Enzymes: No results for input(s): "CKTOTAL", "CKMB", "CKMBINDEX", "TROPONINI" in the last 168 hours.  BNP: Invalid input(s): "POCBNP"  CBG: Recent Labs  Lab 01/23/22 1638  [place]*    Microbiology: Results for orders placed or performed during the hospital encounter of 01/23/22  Blood Culture (routine x 2)     Status: None (Preliminary result)   Collection Time: 01/23/22  9:37 AM   Specimen: BLOOD  Result Value Ref Range Status   Specimen Description BLOOD RIGHT ANTECUBITAL  Final   Special Requests   Final    BOTTLES DRAWN AEROBIC AND ANAEROBIC Blood Culture adequate volume   Culture   Final    NO GROWTH < 24 HOURS Performed at [facility], [address] [place], [place] [address]    Report Status PENDING  Incomplete  Blood Culture (routine x 2)     Status: None (Preliminary result)   Collection Time: 01/23/22  9:37 AM   Specimen: BLOOD  Result Value Ref Range Status   Specimen Description BLOOD LEFT ANTECUBITAL  Final   Special Requests   Final    BOTTLES DRAWN AEROBIC AND ANAEROBIC Blood  Culture results may not be optimal due to an inadequate volume of blood received in culture bottles   Culture   Final    NO GROWTH < 24 HOURS Performed at [facility], [address] [place], [place] [address]    Report Status PENDING  Incomplete  MRSA Next Gen by PCR, Nasal     Status: None   Collection Time: 01/23/22  4:54 PM   Specimen: Nasal Mucosa; Nasal Swab  Result Value Ref Range Status   MRSA by PCR Next Gen NOT DETECTED NOT DETECTED Final    Comment: (NOTE) The GeneXpert MRSA Assay (FDA approved for NASAL specimens only), is one component of a comprehensive MRSA colonization surveillance program. It is not intended to diagnose MRSA infection nor to guide or monitor treatment for MRSA infections. Test performance is not FDA approved in patients less than 46 years old. Performed at [facility], [address] [place], [place] [address]   Culture, blood (Routine X 2) w Reflex to ID Panel     Status: None (Preliminary result)   Collection Time: 01/23/22  5:02 PM   Specimen: BLOOD RIGHT HAND  Result Value Ref Range Status   Specimen Description BLOOD RIGHT HAND  Final   Special Requests   Final    BOTTLES DRAWN AEROBIC AND ANAEROBIC Blood Culture adequate volume   Culture   Final    NO GROWTH < 24 HOURS Performed at [facility], [address] [place], [place] [address]    Report Status PENDING  Incomplete    Coagulation Studies: Recent Labs    01/23/22 0937  LABPROT 15.9*  INR 1.3*    Urinalysis: No results for input(s): "COLORURINE", "LABSPEC", "PHURINE", "GLUCOSEU", "HGBUR", "BILIRUBINUR", "KETONESUR", "PROTEINUR", "UROBILINOGEN", "NITRITE", "LEUKOCYTESUR" in the last 72 hours.  Invalid input(s): "APPERANCEUR"    Imaging: DG Chest Port 1 View  Result Date: 01/23/2022 CLINICAL DATA:  Central line placement. EXAM: PORTABLE CHEST 1 VIEW COMPARISON:  Chest radiograph 01/23/2022 at 9:39 a.m. FINDINGS: Telemetry leads overlie  the chest. A left jugular catheter has been placed and terminates over the upper SVC. The  cardiomediastinal silhouette is unchanged and within normal limits for portable AP technique. The lungs remain hypoinflated. No focal airspace consolidation, overt pulmonary edema, sizable pleural effusion, or pneumothorax is identified. IMPRESSION: Interval left jugular catheter placement as above. Electronically Signed   By: Logan Bores M.D.   On: 01/23/2022 18:42   NM Pulmonary Perfusion  Result Date: 01/23/2022 CLINICAL DATA:  Shortness of breath. EXAM: NUCLEAR MEDICINE PERFUSION LUNG SCAN TECHNIQUE: Perfusion images were obtained in multiple projections after intravenous injection of radiopharmaceutical. Ventilation scans intentionally deferred if perfusion scan and chest x-ray adequate for interpretation during COVID 19 epidemic. RADIOPHARMACEUTICALS:  4.5 mCi Tc-39mMAA IV COMPARISON:  Chest x-ray 01/23/2022 FINDINGS: No segmental or subsegmental perfusion defects to suggest pulmonary embolism. IMPRESSION: Negative perfusion study for pulmonary embolism. Electronically Signed   By: PMarijo SanesM.D.   On: 01/23/2022 16:25   DG Foot Complete Left  Result Date: 01/23/2022 CLINICAL DATA:  Evaluate for osteomyelitis. Possible sepsis. Evidence for chronic osteomyelitis in the calcaneus on previous MRI. EXAM: LEFT FOOT - COMPLETE 3+ VIEW COMPARISON:  Left foot 01/17/2022 and foot MRI 01/17/2022 FINDINGS: Again noted is diffuse soft tissue swelling in the left foot. Stable appearance of the calcaneus with calcaneal spurring. No new areas of bone destruction or periosteal reaction involving the calcaneus. Again noted is a linear density along the plantar soft tissues measuring roughly 8 mm and similar to the prior examination. Negative for an acute fracture or dislocation. Alignment of the left foot is stable. IMPRESSION: 1. No acute bone abnormality to the left foot. No radiographic evidence for osteomyelitis. Refer  to recent MRI from 01/17/2022. 2. Stable appearance of the linear density along the plantar soft tissues. Findings are suggestive for a foreign body or focal calcification. 3. Diffuse soft tissue swelling. 4. Calcaneal spurring. Electronically Signed   By: AMarkus DaftM.D.   On: 01/23/2022 10:10   DG Chest Port 1 View  Result Date: 01/23/2022 CLINICAL DATA:  Concern for sepsis EXAM: PORTABLE CHEST 1 VIEW COMPARISON:  Chest x-ray dated January 17, 2022 FINDINGS: The heart size and mediastinal contours are within normal limits. Low lung volumes with hypoventilatory changes. No definite focal consolidation. The visualized skeletal structures are unremarkable. IMPRESSION: Low lung volumes with hypoventilatory changes. No definite focal consolidation. Electronically Signed   By: LYetta GlassmanM.D.   On: 01/23/2022 09:56     Assessment & Plan: Ms. BRIKITA GRABERTis a 71y.o.  female with past medical conditions including COPD, depression, multiple sclerosis, peripheral vascular disease, hypertension, and ADHD, [facility] Hyponatremia [E87.1] Septic shock ([place] [A41.9, R65.21] Acute renal failure, unspecified acute renal failure type ([place] [N17.9] Cellulitis, unspecified cellulitis site [L03.90] Ulcer of left foot, unspecified ulcer stage ([place] [L97.529]  Acute kidney injury with hyperkalemia on chronic kidney disease stage IIIa.  Baseline creatinine appears to be 1.27 with GFR 45 on 12/24/2021.  Acute kidney injury likely secondary to cardiorenal syndrome and hypotension.  No IV contrast exposure.  Potassium 6.0 on arrival along with increased creatinine 7.70.  Patient also oliguric on presentation.  Appreciate critical care placing HD temp cath.  Patient received initial dialysis treatment yesterday.  Plans to provide second treatment today with low UF.  We will continue to monitor labs and patient presentation to determine further dialysis treatments.  2.  Acute on  chronic diastolic heart failure, decompensated.  Echo from 2021 shows EF 55 to 60%.  Current echo pending.  Cardiology following  3. Anemia of chronic kidney disease Lab Results  Component Value Date   HGB 8.5 (L) 01/24/2022    Hemoglobin below desired target.  We will continue to monitor.  4.  Hypotension due to sepsis.  All antihypertensives held.  Currently prescribed midodrine 10 mg 3 times daily.  Patient weaned off levo.    LOS: 1   9/28/202311:02 AM

## 2022-01-24 NOTE — Progress Notes (Signed)
CRITICAL CARE PROGRESS NOTE    Name: [name] MRN: [id number] DOB: 15-Mar-1951     LOS: 1   SUBJECTIVE FINDINGS & SIGNIFICANT EVENTS    Patient description:  71 year old female with a long standing history of MS from age 89 background history of smoking tobacco she quit in her mid 50s as well as intermittent THC smoking per her husband.  Patient is dependent on most ADLs for family including showering and bathing as well as cooking and cleaning and taking care of herself.  Husband states at times even at baseline she does not participate in verbal communication whereas other times she improves and is able to be partially functional.  She was sent here from rehab facility where she was recovering after having surgery to left lower extremity for cellulitis.  She does have a background history of COPD and sleep apnea, follicular intra-abdominal lymphoma, CKD stage III, bladder and bowel incontinence, major depressive disorder obesity class IV with BMI over 40.  In the ED she appears to be in circulatory shock requiring vasopressor support with swollen lower extremities and excoriated wound with mucopurulent areas.  Blood work with elevated BNP over 1200 suggestive of acute decompensated CHF exacerbation.  Pulmonary critical care consultation placed for additional evaluation management of patient in circulatory shock requiring vasopressor support.  Her x-ray shows bilateral diffuse infiltrates both airspace and interstitial opacification consistent with likely pulmonary edema due to CHF/CKD.   01/24/22- patient did well overnight with improved mentation. We had advanced her diet. Shes off levophed.  Podiatry consult and wound care is on board for left.  She had HD overnight no issues.   Lines/tubes : Urethral Catheter  Double-lumen;Non-latex 16 Fr. (Active)  Indication for Insertion or Continuance of Catheter Chronic catheter use 01/12/22 0800  Site Assessment Clean, Dry, Intact;Clean;Dry 01/12/22 0800  Catheter Maintenance Bag below level of bladder;Bag emptied prior to transport;Insertion date on drainage bag;No dependent loops;Catheter secured;Drainage bag/tubing not touching floor;Seal intact 01/12/22 0800  Collection Container Standard drainage bag 01/12/22 0800  Securement Method Securing device (Describe) 01/12/22 0800  Urinary Catheter Interventions (if applicable) Unclamped 36/14/43 0800  Input (mL) 0 mL 01/11/22 1039  Output (mL) 0 mL 01/12/22 1700    Microbiology/Sepsis markers: Results for orders placed or performed during the hospital encounter of 01/23/22  Blood Culture (routine x 2)     Status: None (Preliminary result)   Collection Time: 01/23/22  9:37 AM   Specimen: BLOOD  Result Value Ref Range Status   Specimen Description BLOOD RIGHT ANTECUBITAL  Final   Special Requests   Final    BOTTLES DRAWN AEROBIC AND ANAEROBIC Blood Culture adequate volume   Culture   Final    NO GROWTH < 24 HOURS Performed at [facility], [address] [place], [place] [address]    Report Status PENDING  Incomplete  Blood Culture (routine x 2)     Status: None (Preliminary result)   Collection Time: 01/23/22  9:37 AM   Specimen: BLOOD  Result Value Ref Range Status   Specimen Description BLOOD LEFT ANTECUBITAL  Final   Special Requests   Final    BOTTLES DRAWN AEROBIC AND ANAEROBIC Blood Culture results may not be optimal due to an inadequate volume of blood received in culture bottles   Culture   Final    NO GROWTH < 24 HOURS Performed at [facility], [address] [place], [place] [address]    Report Status PENDING  Incomplete  MRSA Next Gen by PCR, Nasal     Status: None   Collection Time: 01/23/22  4:54 PM   Specimen: Nasal Mucosa; Nasal Swab  Result Value Ref Range  Status   MRSA by PCR Next Gen NOT DETECTED NOT DETECTED Final    Comment: (NOTE) The GeneXpert MRSA Assay (FDA approved for NASAL specimens only), is one component of a comprehensive MRSA colonization surveillance program. It is not intended to diagnose MRSA infection nor to guide or monitor treatment for MRSA infections. Test performance is not FDA approved in patients less than 51 years old. Performed at [facility], [address] [place], [place] [address]   Culture, blood (Routine X 2) w Reflex to ID Panel     Status: None (Preliminary result)   Collection Time: 01/23/22  5:02 PM   Specimen: BLOOD RIGHT HAND  Result Value Ref Range Status   Specimen Description BLOOD RIGHT HAND  Final   Special Requests   Final    BOTTLES DRAWN AEROBIC AND ANAEROBIC Blood Culture adequate volume   Culture   Final    NO GROWTH < 24 HOURS Performed at [facility], [address] [place], [place] [address]    Report Status PENDING  Incomplete    Anti-infectives:  Anti-infectives (From admission, onward)    Start     Dose/Rate Route Frequency Ordered Stop   01/24/22 1000  metroNIDAZOLE (FLAGYL) IVPB 500 mg        500 mg 100 mL/hr over 60 Minutes Intravenous Every 12 hours 01/24/22 0830     01/23/22 1015  vancomycin (VANCOREADY) IVPB 2000 mg/400 mL        2,000 mg 200 mL/hr over 120 Minutes Intravenous  Once 01/23/22 1003 01/23/22 1345   01/23/22 1015  piperacillin-tazobactam (ZOSYN) IVPB 3.375 g        3.375 g 100 mL/hr over 30 Minutes Intravenous  Once 01/23/22 1003 01/23/22 1050        Consults: Treatment Team:  Ottie Glazier, MD Felipa Furnace, DPM      PAST MEDICAL HISTORY   Past Medical History:  Diagnosis Date   Abdominal aortic atherosclerosis ([place]) 11/11/2016   ADHD    Anxiety    COPD (chronic obstructive pulmonary disease) ([place])    Depression    major depressive   Dyspnea    doe   Edema    left leg   Follicular lymphoma ([place])    B  Cell   Follicular lymphoma grade II ([facility])    Hypertension    Hypotension    idiopathic   Kyphoscoliosis and scoliosis 11/26/2011   Morbid obesity ([place]) 01/05/2016   Multiple sclerosis ([facility])    Multiple sclerosis ([place])    1980's   Neuromuscular disorder ([place])    Obstructive and reflux uropathy    foley   Pain    atypical facial   Peripheral vascular disease of lower extremity with ulceration ([place]) 11/08/2015   Skin ulcer ([place]) 11/08/2015   Weakness    generalized. has MS     SURGICAL HISTORY   Past Surgical History:  Procedure Laterality Date   BACK SURGERY N/A 2002   BONE BIOPSY Left 12/24/2021   Procedure: BONE BIOPSY-HEEL BONE;  Surgeon: Trula Slade, DPM;  Location: [facility];  Service: Podiatry;  Laterality: Left;   CYST EXCISION     lower back   INGUINAL LYMPH NODE BIOPSY Left 07/04/2016   Procedure: INGUINAL LYMPH NODE BIOPSY;  Surgeon: Christene Lye,  MD;  Location: [facility];  Service: General;  Laterality: Left;   ORIF FEMUR FRACTURE Left 02/04/2020   Procedure: OPEN REDUCTION INTERNAL FIXATION (ORIF) DISTAL FEMUR FRACTURE;  Surgeon: Shona Needles, MD;  Location: [place];  Service: Orthopedics;  Laterality: Left;   PORTACATH PLACEMENT N/A 07/22/2016   Procedure: INSERTION PORT-A-CATH;  Surgeon: Christene Lye, MD;  Location: [facility];  Service: General;  Laterality: N/A;   TONSILLECTOMY AND ADENOIDECTOMY     TUBAL LIGATION     WOUND DEBRIDEMENT Left 12/24/2021   Procedure: DEBRIDEMENT WOUND-HEEL ULCER;  Surgeon: Trula Slade, DPM;  Location: [facility];  Service: Podiatry;  Laterality: Left;     FAMILY HISTORY   Family History  Problem Relation Age of Onset   COPD Mother    Diabetes Mother    Heart failure Mother    Alcohol abuse Father    Kidney disease Father    Kidney failure Father    Arthritis Sister    CAD Maternal Grandmother    Stroke Maternal Grandfather    Arthritis Sister    Mental illness Sister    Arthritis Brother       SOCIAL HISTORY   Social History   Tobacco Use   Smoking status: Former    Packs/day: 1.00    Years: 20.00    Total pack years: 20.00    Types: Cigarettes    Start date: 04/30/1995    Quit date: 02/03/2016    Years since quitting: 5.9   Smokeless tobacco: Never  Vaping Use   Vaping Use: Some days  Substance Use Topics   Alcohol use: No    Alcohol/week: 0.0 standard drinks of alcohol   Drug use: Yes    Types: Marijuana    Comment: smokes THC occasionally per pt      MEDICATIONS   Current Medication:  Current Facility-Administered Medications:    alteplase (CATHFLO ACTIVASE) injection 2 mg, 2 mg, Intracatheter, Once PRN, Breeze, Shantelle, NP   anticoagulant sodium citrate solution 5 mL, 5 mL, Intracatheter, PRN, Gwyneth Revels, Shantelle, NP   Chlorhexidine Gluconate Cloth 2 % PADS 6 each, 6 each, Topical, Q0600, Ottie Glazier, MD, 6 each at 01/23/22 1640   Chlorhexidine Gluconate Cloth 2 % PADS 6 each, 6 each, Topical, Q0600, Breeze, Shantelle, NP   heparin injection 1,000 Units, 1,000 Units, Intracatheter, PRN, Breeze, Benancio Deeds, NP   heparin injection 5,000 Units, 5,000 Units, Subcutaneous, Q8H, Aprille Sawhney, MD, 5,000 Units at 01/24/22 0617   ipratropium-albuterol (DUONEB) 0.5-2.5 (3) MG/3ML nebulizer solution 3 mL, 3 mL, Nebulization, Q6H, Darel Hong D, NP, 3 mL at 01/24/22 0743   lidocaine (PF) (XYLOCAINE) 1 % injection 5 mL, 5 mL, Intradermal, PRN, Breeze, Shantelle, NP   lidocaine-prilocaine (EMLA) cream 1 Application, 1 Application, Topical, PRN, Breeze, Shantelle, NP   metroNIDAZOLE (FLAGYL) IVPB 500 mg, 500 mg, Intravenous, Q12H, Darel Hong D, NP   norepinephrine (LEVOPHED) '4mg'$  in 21m (0.016 mg/mL) premix infusion, 0-40 mcg/min, Intravenous, Continuous, MRada Hay MD, Last Rate: 22.5 mL/hr at 01/24/22 0600, 6 mcg/min at 01/24/22 0600   pentafluoroprop-tetrafluoroeth (GEBAUERS) aerosol 1 Application, 1 Application, Topical, PRN, Breeze,  Shantelle, NP   polyethylene glycol (MIRALAX / GLYCOLAX) packet 17 g, 17 g, Oral, Daily PRN, AOttie Glazier MD  Facility-Administered Medications Ordered in Other Encounters:    heparin lock flush 100 unit/mL, 500 Units, Intracatheter, Once PRN, RSindy Guadeloupe MD    ALLERGIES   Patient has no known allergies.    REVIEW OF SYSTEMS  10 point review of systems conducted and is negative except for dyspnea and left lower extremity discomfort/pain   PHYSICAL EXAMINATION   Vital Signs: Temp:  [97.6 F (36.4 C)-98.2 F (36.8 C)] 98.1 F (36.7 C) (09/28 0400) Pulse Rate:  [25-125] 43 (09/28 0600) Resp:  [12-31] 17 (09/28 0600) BP: (65-169)/(34-156) 96/52 (09/28 0600) SpO2:  [75 %-100 %] 95 % (09/28 0600) Weight:  [132.1 kg-134.7 kg] 132.1 kg (09/28 0500)  GENERAL: Mild distress due to acutely ill status, age-appropriate HEAD: Normocephalic, atraumatic.  EYES: Pupils equal, round, reactive to light.  No scleral icterus.  MOUTH: Moist mucosal membrane. NECK: Supple. No thyromegaly. No nodules. No JVD.  PULMONARY: Crackles bilaterally worse at the bases CARDIOVASCULAR: S1 and S2. Regular rate and rhythm. No murmurs, rubs, or gallops.  GASTROINTESTINAL: Soft, nontender, non-distended. No masses. Positive bowel sounds. No hepatosplenomegaly.  MUSCULOSKELETAL: No swelling, clubbing, or edema.  NEUROLOGIC: Mild distress due to acute illness , patient with multiple sclerosis  SKIN:intact,warm,dry   PERTINENT DATA     Infusions:  anticoagulant sodium citrate     metronidazole     norepinephrine (LEVOPHED) Adult infusion 6 mcg/min (01/24/22 0600)   Scheduled Medications:  Chlorhexidine Gluconate Cloth  6 each Topical Q0600   Chlorhexidine Gluconate Cloth  6 each Topical Q0600   heparin  5,000 Units Subcutaneous Q8H   ipratropium-albuterol  3 mL Nebulization Q6H   PRN Medications: alteplase, anticoagulant sodium citrate, heparin, lidocaine (PF), lidocaine-prilocaine,  pentafluoroprop-tetrafluoroeth, polyethylene glycol Hemodynamic parameters:   Intake/Output: 09/27 0701 - 09/28 0700 In: 291.2 [I.V.:291.2] Out: 550 [Urine:250]  Ventilator  Settings:    LAB RESULTS:  Basic Metabolic Panel: Recent Labs  Lab 01/17/22 1720 01/23/22 0937 01/23/22 1038 01/23/22 2255 01/24/22 0533  NA 131* 122*  --  125* 127*  K 4.5 6.0*  --  5.1 5.4*  CL 96* 92*  --  93* 95*  CO2 23 16*  --  20* 20*  GLUCOSE 105* 82  --  120* 125*  BUN 47* 84*  --  65* 70*  CREATININE 1.91* 7.07*  --  5.48* 5.97*  CALCIUM 9.1 8.3*  --  8.0* 8.3*  MG  --   --  2.5* 2.2 2.3  PHOS  --   --   --   --  7.4*    Liver Function Tests: Recent Labs  Lab 01/17/22 1720 01/23/22 0937  AST 17 21  ALT 15 22  ALKPHOS 66 64  BILITOT 0.6 0.8  PROT 7.0 6.5  ALBUMIN 3.6 3.3*    No results for input(s): "LIPASE", "AMYLASE" in the last 168 hours. No results for input(s): "AMMONIA" in the last 168 hours. CBC: Recent Labs  Lab 01/17/22 1720 01/23/22 0937 01/24/22 0533  WBC 11.9* 8.2 11.4*  NEUTROABS 9.9* 7.1  --   HGB 10.4* 8.4* 8.5*  HCT 32.7* 26.5* 26.6*  MCV 86.5 84.4 83.1  PLT 257 243 306    Cardiac Enzymes: No results for input(s): "CKTOTAL", "CKMB", "CKMBINDEX", "TROPONINI" in the last 168 hours. BNP: Invalid input(s): "POCBNP" CBG: Recent Labs  Lab 01/23/22 1638  GLUCAP 102*       IMAGING RESULTS:  Imaging: DG Chest Port 1 View  Result Date: 01/23/2022 CLINICAL DATA:  Central line placement. EXAM: PORTABLE CHEST 1 VIEW COMPARISON:  Chest radiograph 01/23/2022 at 9:39 a.m. FINDINGS: Telemetry leads overlie the chest. A left jugular catheter has been placed and terminates over the upper SVC. The cardiomediastinal silhouette is unchanged and within normal limits for portable  AP technique. The lungs remain hypoinflated. No focal airspace consolidation, overt pulmonary edema, sizable pleural effusion, or pneumothorax is identified. IMPRESSION: Interval left  jugular catheter placement as above. Electronically Signed   By: Logan Bores M.D.   On: 01/23/2022 18:42   NM Pulmonary Perfusion  Result Date: 01/23/2022 CLINICAL DATA:  Shortness of breath. EXAM: NUCLEAR MEDICINE PERFUSION LUNG SCAN TECHNIQUE: Perfusion images were obtained in multiple projections after intravenous injection of radiopharmaceutical. Ventilation scans intentionally deferred if perfusion scan and chest x-ray adequate for interpretation during COVID 19 epidemic. RADIOPHARMACEUTICALS:  4.5 mCi Tc-36mMAA IV COMPARISON:  Chest x-ray 01/23/2022 FINDINGS: No segmental or subsegmental perfusion defects to suggest pulmonary embolism. IMPRESSION: Negative perfusion study for pulmonary embolism. Electronically Signed   By: PMarijo SanesM.D.   On: 01/23/2022 16:25   DG Foot Complete Left  Result Date: 01/23/2022 CLINICAL DATA:  Evaluate for osteomyelitis. Possible sepsis. Evidence for chronic osteomyelitis in the calcaneus on previous MRI. EXAM: LEFT FOOT - COMPLETE 3+ VIEW COMPARISON:  Left foot 01/17/2022 and foot MRI 01/17/2022 FINDINGS: Again noted is diffuse soft tissue swelling in the left foot. Stable appearance of the calcaneus with calcaneal spurring. No new areas of bone destruction or periosteal reaction involving the calcaneus. Again noted is a linear density along the plantar soft tissues measuring roughly 8 mm and similar to the prior examination. Negative for an acute fracture or dislocation. Alignment of the left foot is stable. IMPRESSION: 1. No acute bone abnormality to the left foot. No radiographic evidence for osteomyelitis. Refer to recent MRI from 01/17/2022. 2. Stable appearance of the linear density along the plantar soft tissues. Findings are suggestive for a foreign body or focal calcification. 3. Diffuse soft tissue swelling. 4. Calcaneal spurring. Electronically Signed   By: AMarkus DaftM.D.   On: 01/23/2022 10:10   DG Chest Port 1 View  Result Date:  01/23/2022 CLINICAL DATA:  Concern for sepsis EXAM: PORTABLE CHEST 1 VIEW COMPARISON:  Chest x-ray dated January 17, 2022 FINDINGS: The heart size and mediastinal contours are within normal limits. Low lung volumes with hypoventilatory changes. No definite focal consolidation. The visualized skeletal structures are unremarkable. IMPRESSION: Low lung volumes with hypoventilatory changes. No definite focal consolidation. Electronically Signed   By: LYetta GlassmanM.D.   On: 01/23/2022 09:56   '@PROBHOSP'$ @ DG Chest Port 1 View  Result Date: 01/23/2022 CLINICAL DATA:  Central line placement. EXAM: PORTABLE CHEST 1 VIEW COMPARISON:  Chest radiograph 01/23/2022 at 9:39 a.m. FINDINGS: Telemetry leads overlie the chest. A left jugular catheter has been placed and terminates over the upper SVC. The cardiomediastinal silhouette is unchanged and within normal limits for portable AP technique. The lungs remain hypoinflated. No focal airspace consolidation, overt pulmonary edema, sizable pleural effusion, or pneumothorax is identified. IMPRESSION: Interval left jugular catheter placement as above. Electronically Signed   By: ALogan BoresM.D.   On: 01/23/2022 18:42   NM Pulmonary Perfusion  Result Date: 01/23/2022 CLINICAL DATA:  Shortness of breath. EXAM: NUCLEAR MEDICINE PERFUSION LUNG SCAN TECHNIQUE: Perfusion images were obtained in multiple projections after intravenous injection of radiopharmaceutical. Ventilation scans intentionally deferred if perfusion scan and chest x-ray adequate for interpretation during COVID 19 epidemic. RADIOPHARMACEUTICALS:  4.5 mCi Tc-930mAA IV COMPARISON:  Chest x-ray 01/23/2022 FINDINGS: No segmental or subsegmental perfusion defects to suggest pulmonary embolism. IMPRESSION: Negative perfusion study for pulmonary embolism. Electronically Signed   By: P.Marijo Sanes.D.   On: 01/23/2022 16:25     ASSESSMENT  AND PLAN    -Multidisciplinary rounds held today  Acute Hypoxic  Respiratory Failure -Due to CHF and CKD overlap syndrome. -supplemental O2 -diuresis as able -nephrology consult  -Resp viral panle -procalcitonin and respiratory cultures  -VQ scan for PE rule out  Acute decompensated diastolic CHF EF >66%   - most recent TTE is >50 years old , will repeat echo  - cardiology consult - appreciate input   - patient with CKD difficult to diurese - nephrology on board -oxygen as needed -follow up cardiac enzymes as indicated ICU monitoring   Renal Failure-acute on chronic renal failure stage 5 KDIGO -follow chem 7 -follow UO -continue Foley Catheter-assess need daily -nephrology consult - patient may need dialysis due to severe renal impairment, oliguric with electorlyte derrangements   Severe hyperkalemia  -s/p EKG -s/p calcium gluconate with insulin an dD5W   Possible adrenal insufficiency  - patient with hypotension with Na and K electrolyte disturbances  - empirically received hydrocortisone in ER   Septic shock- present on admission due to left ankle wound infection -use vasopressors to keep MAP>65 -follow ABG and LA -follow up cultures -emperic ABX- Vanco/zosyn IV 3.375 - need renal dosed- changed to vanc/cefepime -   Chronic MS  - patient is at times non verbal and is fully dependent on family for all ADLs  - continue home- trintellix    ID -continue IV abx as prescibed -follow up cultures  GI/Nutrition GI PROPHYLAXIS as indicated DIET-->TF's as tolerated Constipation protocol as indicated  ENDO - ICU hypoglycemic\Hyperglycemia protocol -check FSBS per protocol   ELECTROLYTES -follow labs as needed -replace as needed -pharmacy consultation   DVT/GI PRX ordered -SCDs  TRANSFUSIONS AS NEEDED MONITOR FSBS ASSESS the need for LABS as needed   Critical care provider statement:    Critical care time (minutes):  33   Critical care time was exclusive of:  Separately billable procedures and treating other  patients   Critical care was necessary to treat or prevent imminent or life-threatening deterioration of the following conditions:  Septic shock, CHF exacerbation , acute on chronic renal failure, acute hypoxemic respiratory failure, chonic MS   Critical care was time spent personally by me on the following activities:  Development of treatment plan with patient or surrogate, discussions with consultants, evaluation of patient's response to treatment, examination of patient, obtaining history from patient or surrogate, ordering and performing treatments and interventions, ordering and review of laboratory studies and re-evaluation of patient's condition.  I assumed direction of critical care for this patient from another provider in my specialty: no    This document was prepared using Dragon voice recognition software and may include unintentional dictation errors.    Ottie Glazier, M.D.  Division of [place]

## 2022-01-24 NOTE — Consult Note (Signed)
[facility] for Electrolyte Monitoring and Replacement   Recent Labs: Potassium (mmol/L)  Date Value  01/24/2022 5.4 (H)  08/01/2013 3.5   Magnesium (mg/dL)  Date Value  01/24/2022 2.3   Calcium (mg/dL)  Date Value  01/24/2022 8.3 (L)   Calcium, Total (mg/dL)  Date Value  08/01/2013 8.9   Albumin (g/dL)  Date Value  01/23/2022 3.3 (L)  08/01/2013 4.1   Phosphorus (mg/dL)  Date Value  01/24/2022 7.4 (H)   Sodium (mmol/L)  Date Value  01/24/2022 127 (L)  08/01/2013 125 (L)     Assessment: 71yo F w/ h/o MDD/anxiety, legally blind, COPD on oxygen, DM, pancreatic insufficiency, SDH s/p craniotomy in July 2023, and chronic left heel ulceration c/b chronic osteomyelitis (Dx via MRI on 9/21) who presents with concern for possible sepsis c/b acute renal failure, electrolyte abnormalities and hypovolemic shock. Pharmacy consulted for mgmt of electrolytes.  Patient underwent emergent dialysis for renal failure c/b hyperkalemia on admission  Goal of Therapy:  Electrolytes within normal limits  Plan: --Na 122 >> 127, continue to monitor. BNP also elevated which may indicate hypervolemic hyponatremia. Expect to improve with HD --K 6 >> 5.4 s/p first hemodialysis treatment. Will defer further management to nephrology / primary team --Follow-up electrolytes with AM labs tomorrow  Benita Gutter 01/24/2022 8:23 AM

## 2022-01-24 NOTE — Consult Note (Addendum)
Pharmacy Antibiotic Note  [name] is a 71 y.o. female with PMH including MS, COPD, follicular lymphoma, morbid obesity, PVD with lower extremity ulceration, depression / anxiety admitted on 01/23/2022 with  septic shock secondary to left foot infection and renal failure necessitating renal replacement therapy .  Pharmacy has been consulted for vancomycin and cefepime dosing. Patient is also ordered metronidazole.  Plan:  Cefepime 1 g IV q24h  Vancomycin 2 g IV LD given in ED --Patient has been initiated on iHD per nephrology, first session 9/27 for about 2 hours at BFR of 200 --Second session planned for 9/28, prescription for 2.5 hours at BFR of 200 --Will go ahead and give supplemental dose of vancomycin 1 g with last hour of HD today or post-HD --Will check a random level tomorrow AM --Continue to follow RRT plan per nephrology  Height: '5\' 4"'$  (162.6 cm) Weight: 132.1 kg (291 lb 3.6 oz) IBW/kg (Calculated) : 54.7  Temp (24hrs), Avg:98 F (36.7 C), Min:97.6 F (36.4 C), Max:98.3 F (36.8 C)  Recent Labs  Lab 01/17/22 1720 01/23/22 0937 01/23/22 2255 01/24/22 0533  WBC 11.9* 8.2  --  11.4*  CREATININE 1.91* 7.07* 5.48* 5.97*  LATICACIDVEN 1.1 0.7  --   --     Estimated Creatinine Clearance: 11.7 mL/min (A) (by C-G formula based on SCr of 5.97 mg/dL (H)).    No Known Allergies  Antimicrobials this admission: Zosyn 9/27 x 1 Vancomycin 9/27 >>  Cefepime 9/28 >>  Metronidazole 9/28 >>  Dose adjustments this admission: N/A  Microbiology results: 9/27 BCx: NGTD 9/27 UCx: pending  9/27 MRSA PCR: (-)  Thank you for allowing pharmacy to be a part of this patient's care.  Benita Gutter 01/24/2022 8:17 AM

## 2022-01-24 NOTE — Progress Notes (Signed)
Hemodialysis Post Treatment Note:   Initial treatment for patient, [facility] CVC functions without incident. No fluid removed. Pt bradycardic throughout tx. ICU staff titration Levophed. Pt. Tolerated well, arousableduring tx responses appropriate. Anticipate day 2 of tx next day with extend time. Machine difficulties hindered from completely quickly. Nephrology team made aware of concerns.

## 2022-01-24 NOTE — Progress Notes (Signed)
Hd tx of 2.5hrs comleted. 30L total vol processed. No complications. Report given to sandra borba, rn. Total uf removed: 557m Post hd wt: 135.9kg Post hd v/s: 98.3 110/52(70) 52 14 93%

## 2022-01-25 DIAGNOSIS — Z66 Do not resuscitate: Secondary | ICD-10-CM

## 2022-01-25 DIAGNOSIS — R6521 Severe sepsis with septic shock: Secondary | ICD-10-CM

## 2022-01-25 DIAGNOSIS — Z515 Encounter for palliative care: Secondary | ICD-10-CM | POA: Diagnosis not present

## 2022-01-25 DIAGNOSIS — A419 Sepsis, unspecified organism: Secondary | ICD-10-CM | POA: Diagnosis not present

## 2022-01-25 DIAGNOSIS — N179 Acute kidney failure, unspecified: Secondary | ICD-10-CM

## 2022-01-25 DIAGNOSIS — Z7189 Other specified counseling: Secondary | ICD-10-CM | POA: Diagnosis not present

## 2022-01-25 LAB — URINE CULTURE: Culture: >=100000 — AB

## 2022-01-25 LAB — CBC
HCT: 25.9 % — ABNORMAL LOW (ref 36.0–46.0)
Hemoglobin: 8.4 g/dL — ABNORMAL LOW (ref 12.0–15.0)
MCH: 26.8 pg (ref 26.0–34.0)
MCHC: 32.4 g/dL (ref 30.0–36.0)
MCV: 82.7 fL (ref 80.0–100.0)
Platelets: 302 10*3/uL (ref 150–400)
RBC: 3.13 MIL/uL — ABNORMAL LOW (ref 3.87–5.11)
RDW: 14.4 % (ref 11.5–15.5)
WBC: 10.5 10*3/uL (ref 4.0–10.5)
nRBC: 0 % (ref 0.0–0.2)

## 2022-01-25 LAB — BASIC METABOLIC PANEL
Anion gap: 16 — ABNORMAL HIGH (ref 5–15)
BUN: 57 mg/dL — ABNORMAL HIGH (ref 8–23)
CO2: 21 mmol/L — ABNORMAL LOW (ref 22–32)
Calcium: 8.7 mg/dL — ABNORMAL LOW (ref 8.9–10.3)
Chloride: 92 mmol/L — ABNORMAL LOW (ref 98–111)
Creatinine, Ser: 4.53 mg/dL — ABNORMAL HIGH (ref 0.44–1.00)
GFR, Estimated: 10 mL/min — ABNORMAL LOW (ref >60)
Glucose, Bld: 105 mg/dL — ABNORMAL HIGH (ref 70–99)
Potassium: 4.6 mmol/L (ref 3.5–5.1)
Sodium: 129 mmol/L — ABNORMAL LOW (ref 135–145)

## 2022-01-25 LAB — VANCOMYCIN, RANDOM: Vancomycin Rm: 10 ug/mL

## 2022-01-25 LAB — HEPATITIS B SURFACE ANTIBODY, QUANTITATIVE: Hep B S AB Quant (Post): <3.1 m[IU]/mL — ABNORMAL LOW (ref >9.9)

## 2022-01-25 LAB — PROCALCITONIN: Procalcitonin: 0.39 ng/mL

## 2022-01-25 MED ORDER — CLONAZEPAM 0.5 MG PO TABS
0.5000 mg | ORAL_TABLET | Freq: Two times a day (BID) | ORAL | Status: DC
  Administered 2022-01-25 – 2022-02-06 (×25): 0.5 mg via ORAL
  Filled 2022-01-25 (×26): qty 1

## 2022-01-25 MED ORDER — VANCOMYCIN HCL IN DEXTROSE 1-5 GM/200ML-% IV SOLN
1000.0000 mg | INTRAVENOUS | Status: DC
  Filled 2022-01-25: qty 200

## 2022-01-25 MED ORDER — BUPROPION HCL ER (XL) 150 MG PO TB24
300.0000 mg | ORAL_TABLET | Freq: Every day | ORAL | Status: DC
  Administered 2022-01-25 – 2022-02-06 (×13): 300 mg via ORAL
  Filled 2022-01-25: qty 2
  Filled 2022-01-25: qty 1
  Filled 2022-01-25: qty 2
  Filled 2022-01-25 (×3): qty 1
  Filled 2022-01-25: qty 2
  Filled 2022-01-25: qty 1
  Filled 2022-01-25 (×6): qty 2

## 2022-01-25 MED ORDER — GABAPENTIN 600 MG PO TABS
600.0000 mg | ORAL_TABLET | Freq: Three times a day (TID) | ORAL | Status: DC
  Administered 2022-01-25: 600 mg via ORAL
  Filled 2022-01-25 (×2): qty 1

## 2022-01-25 MED ORDER — VANCOMYCIN HCL IN DEXTROSE 1-5 GM/200ML-% IV SOLN
1000.0000 mg | Freq: Once | INTRAVENOUS | Status: DC
  Filled 2022-01-25: qty 200

## 2022-01-25 MED ORDER — GABAPENTIN 600 MG PO TABS
300.0000 mg | ORAL_TABLET | Freq: Every day | ORAL | Status: DC
  Filled 2022-01-25: qty 0.5

## 2022-01-25 MED ORDER — TRAZODONE HCL 50 MG PO TABS
50.0000 mg | ORAL_TABLET | Freq: Every day | ORAL | Status: DC
  Administered 2022-01-25 – 2022-02-05 (×12): 50 mg via ORAL
  Filled 2022-01-25 (×13): qty 1

## 2022-01-25 MED ORDER — DULOXETINE HCL 30 MG PO CPEP
60.0000 mg | ORAL_CAPSULE | ORAL | Status: DC
  Administered 2022-01-25 – 2022-02-06 (×13): 60 mg via ORAL
  Filled 2022-01-25 (×13): qty 2

## 2022-01-25 MED ORDER — IPRATROPIUM-ALBUTEROL 0.5-2.5 (3) MG/3ML IN SOLN
3.0000 mL | Freq: Two times a day (BID) | RESPIRATORY_TRACT | Status: DC
  Administered 2022-01-25 – 2022-01-31 (×13): 3 mL via RESPIRATORY_TRACT
  Filled 2022-01-25 (×13): qty 3

## 2022-01-25 MED ORDER — GABAPENTIN 300 MG PO CAPS
300.0000 mg | ORAL_CAPSULE | Freq: Every day | ORAL | Status: DC
  Administered 2022-01-25 – 2022-02-01 (×8): 300 mg via ORAL
  Filled 2022-01-25 (×8): qty 1

## 2022-01-25 MED ORDER — VORTIOXETINE HBR 5 MG PO TABS
5.0000 mg | ORAL_TABLET | Freq: Every day | ORAL | Status: DC
  Administered 2022-01-25 – 2022-02-06 (×13): 5 mg via ORAL
  Filled 2022-01-25 (×14): qty 1

## 2022-01-25 NOTE — Progress Notes (Signed)
SUBJECTIVE: Patient is a 71 year old female with history of atrial fibrillation, CHF, admitted for septic shock hypotension large ulcer left lower leg.    Vitals:   01/25/22 1300 01/25/22 1304 01/25/22 1307 01/25/22 1315  BP: (!) 119/108 139/69  115/77  Pulse: 76 (!) 57  77  Resp: '12 11  13  '$ Temp:  97.7 F (36.5 C)    TempSrc:  Oral    SpO2: 95% 98%  95%  Weight:   133.8 kg   Height:        Intake/Output Summary (Last 24 hours) at 01/25/2022 1343 Last data filed at 01/25/2022 1321 Gross per 24 hour  Intake 817.62 ml  Output 2115 ml  Net -1297.38 ml    LABS: Basic Metabolic Panel: Recent Labs    01/23/22 2255 01/24/22 0533 01/25/22 0534  NA 125* 127* 129*  K 5.1 5.4* 4.6  CL 93* 95* 92*  CO2 20* 20* 21*  GLUCOSE 120* 125* 105*  BUN 65* 70* 57*  CREATININE 5.48* 5.97* 4.53*  CALCIUM 8.0* 8.3* 8.7*  MG 2.2 2.3  --   PHOS  --  7.4*  --    Liver Function Tests: Recent Labs    01/23/22 0937  AST 21  ALT 22  ALKPHOS 64  BILITOT 0.8  PROT 6.5  ALBUMIN 3.3*   No results for input(s): "LIPASE", "AMYLASE" in the last 72 hours. CBC: Recent Labs    01/23/22 0937 01/24/22 0533 01/25/22 0534  WBC 8.2 11.4* 10.5  NEUTROABS 7.1  --   --   HGB 8.4* 8.5* 8.4*  HCT 26.5* 26.6* 25.9*  MCV 84.4 83.1 82.7  PLT 243 306 302   Cardiac Enzymes: No results for input(s): "CKTOTAL", "CKMB", "CKMBINDEX", "TROPONINI" in the last 72 hours. BNP: Invalid input(s): "POCBNP" D-Dimer: No results for input(s): "DDIMER" in the last 72 hours. Hemoglobin A1C: No results for input(s): "HGBA1C" in the last 72 hours. Fasting Lipid Panel: No results for input(s): "CHOL", "HDL", "LDLCALC", "TRIG", "CHOLHDL", "LDLDIRECT" in the last 72 hours. Thyroid Function Tests: Recent Labs    01/23/22 2255  TSH 2.148   Anemia Panel: No results for input(s): "VITAMINB12", "FOLATE", "FERRITIN", "TIBC", "IRON", "RETICCTPCT" in the last 72 hours.   PHYSICAL EXAM General: Well developed, well  nourished, in no acute distress HEENT:  Normocephalic and atramatic Neck:  No JVD.  Lungs: Clear bilaterally to auscultation and percussion. Heart: HRRR . Normal S1 and S2 without gallops or murmurs.  Abdomen: Bowel sounds are positive, abdomen soft and non-tender  Msk:  Back normal, normal gait. Normal strength and tone for age. Extremities: No clubbing, cyanosis or edema.   Neuro: Alert and oriented X 3. Psych:  Good affect, responds appropriately  TELEMETRY: atrial fibrillation  ASSESSMENT AND PLAN: Patient denies chest pain, lower extremity edema improved. Fluid volume managed through dialysis. Agree with plan. Will continue to follow.   Principal Problem:   Septic shock ([place])    Rollo Farquhar, FNP-C 01/25/2022 1:43 PM

## 2022-01-25 NOTE — Consult Note (Addendum)
Pharmacy Antibiotic Note  [name] is a 71 y.o. female with PMH including MS, COPD, follicular lymphoma, morbid obesity, PVD with lower extremity ulceration, depression/anxiety admitted on 01/23/2022 with  septic shock secondary to left foot infection and renal failure necessitating renal replacement therapy .  Pharmacy has been consulted for Vancomycin and Cefepime dosing. Patient is also ordered Metronidazole.  Assessment: Current regimen: Vancomycin 1000 mg IV QMWF after HD with supplemental doses as needed per Vanc level monitoring Preceding dose: Vancomycin 2000 mg IV 9/27@ 1036  Vanc rm 9/29 @ 0534: 10 ug/mL Patient on iHD per nephrology. Has received HD sessions 9/27 (~2hr, BFR 200), 9/28 (2.5 hr, BFR 300), and 9/29 (3 hr, BFR 300).  Next HD session planned Monday 10/2 unless respiratory status declines.   Plan: Day 3 of antibiotics Give Vancomycin 1000 mg IV x1 after HD today. Continue Vancomycin 1000 mg IV QMWF after HD Consider follow up Vanc level next week Continue Cefepime 1g IV Q24H Patient is also on Metronidazole 500 mg Q12H Continue to follow renal function and HD schedule per nephrology and monitor culture results   Height: '5\' 4"'$  (162.6 cm) Weight: 135.9 kg (299 lb 9.7 oz) IBW/kg (Calculated) : 54.7  Temp (24hrs), Avg:98.5 F (36.9 C), Min:98.2 F (36.8 C), Max:98.7 F (37.1 C)  Recent Labs  Lab 01/23/22 0937 01/23/22 2255 01/24/22 0533 01/25/22 0534  WBC 8.2  --  11.4* 10.5  CREATININE 7.07* 5.48* 5.97* 4.53*  LATICACIDVEN 0.7  --   --   --   VANCORANDOM  --   --   --  10     Estimated Creatinine Clearance: 15.7 mL/min (A) (by C-G formula based on SCr of 4.53 mg/dL (H)).    No Known Allergies  Antimicrobials this admission: 9/27 Zosyn 3.375g IV x 1 9/27 Vancomycin >>  9/28 Cefepime >>  9/28 Metronidazole >>   Microbiology results: 9/27 BCx: NG2D 9/27 UCx: >100k Pseudomonas aeruginosa (pansensitive)  9/27 MRSA PCR: (-) 9/28 Wound Cx: few  GPC in pairs, few GNR (TYTR)  Thank you for allowing pharmacy to be a part of this patient's care.  Gretel Acre, PharmD PGY1 Pharmacy Resident 01/25/2022 9:52 AM

## 2022-01-25 NOTE — Consult Note (Signed)
Reason for Consult: Ulcer of left foot with sepsis Referring Physician: Dr. [name] is an 71 y.o. female.  HPI: She is known to our practice from previous left foot surgery 1 month ago.  She has not been able to be seen in our outpatient office in some time.  She was admitted for sepsis.  There is concern for osteomyelitis and infection of her wounds on the left foot.  She had a start dialysis.  Palliative care has been consulted.  She is planning to meet with her family and sons to discuss goals of care  Past Medical History:  Diagnosis Date   Abdominal aortic atherosclerosis ([place]) 11/11/2016   ADHD    Anxiety    COPD (chronic obstructive pulmonary disease) (HCC)    Depression    major depressive   Dyspnea    doe   Edema    left leg   Follicular lymphoma ([place])    B Cell   Follicular lymphoma grade II ([place])    Hypertension    Hypotension    idiopathic   Kyphoscoliosis and scoliosis 11/26/2011   Morbid obesity ([place]) 01/05/2016   Multiple sclerosis (HCC)    Multiple sclerosis ([place])    1980's   Neuromuscular disorder ([place])    Obstructive and reflux uropathy    foley   Pain    atypical facial   Peripheral vascular disease of lower extremity with ulceration ([place]) 11/08/2015   Skin ulcer ([place]) 11/08/2015   Weakness    generalized. has MS    Past Surgical History:  Procedure Laterality Date   BACK SURGERY N/A 2002   BONE BIOPSY Left 12/24/2021   Procedure: BONE BIOPSY-HEEL BONE;  Surgeon: Trula Slade, DPM;  Location: [facility];  Service: Podiatry;  Laterality: Left;   CYST EXCISION     lower back   INGUINAL LYMPH NODE BIOPSY Left 07/04/2016   Procedure: INGUINAL LYMPH NODE BIOPSY;  Surgeon: Christene Lye, MD;  Location: [facility];  Service: General;  Laterality: Left;   ORIF FEMUR FRACTURE Left 02/04/2020   Procedure: OPEN REDUCTION INTERNAL FIXATION (ORIF) DISTAL FEMUR FRACTURE;  Surgeon: Shona Needles, MD;  Location: [place];  Service: Orthopedics;   Laterality: Left;   PORTACATH PLACEMENT N/A 07/22/2016   Procedure: INSERTION PORT-A-CATH;  Surgeon: Christene Lye, MD;  Location: [facility];  Service: General;  Laterality: N/A;   TONSILLECTOMY AND ADENOIDECTOMY     TUBAL LIGATION     WOUND DEBRIDEMENT Left 12/24/2021   Procedure: DEBRIDEMENT WOUND-HEEL ULCER;  Surgeon: Trula Slade, DPM;  Location: [facility];  Service: Podiatry;  Laterality: Left;    Family History  Problem Relation Age of Onset   COPD Mother    Diabetes Mother    Heart failure Mother    Alcohol abuse Father    Kidney disease Father    Kidney failure Father    Arthritis Sister    CAD Maternal Grandmother    Stroke Maternal Grandfather    Arthritis Sister    Mental illness Sister    Arthritis Brother     Social History:  reports that she quit smoking about 5 years ago. Her smoking use included cigarettes. She started smoking about 26 years ago. She has a 20.00 pack-year smoking history. She has never used smokeless tobacco. She reports current drug use. Drug: Marijuana. She reports that she does not drink alcohol.  Allergies: No Known Allergies  Medications: I have reviewed the patient's current medications.  Results for orders placed or performed during the hospital encounter of 01/23/22 (from the past 48 hour(s))  Basic metabolic panel     Status: Abnormal   Collection Time: 01/23/22 10:55 PM  Result Value Ref Range   Sodium 125 (L) 135 - 145 mmol/L   Potassium 5.1 3.5 - 5.1 mmol/L   Chloride 93 (L) 98 - 111 mmol/L   CO2 20 (L) 22 - 32 mmol/L   Glucose, Bld 120 (H) 70 - 99 mg/dL    Comment: Glucose reference range applies only to samples taken after fasting for at least 8 hours.   BUN 65 (H) 8 - 23 mg/dL   Creatinine, Ser 5.48 (H) 0.44 - 1.00 mg/dL   Calcium 8.0 (L) 8.9 - 10.3 mg/dL   GFR, Estimated 8 (L) >60 mL/min    Comment: (NOTE) Calculated using the CKD-EPI Creatinine Equation (2021)    Anion gap 12 5 - 15    Comment: Performed at  [facility], [address] [place], [place] [address]  Magnesium     Status: None   Collection Time: 01/23/22 10:55 PM  Result Value Ref Range   Magnesium 2.2 1.7 - 2.4 mg/dL    Comment: Performed at [facility], [address] [place], [place] [address]  TSH     Status: None   Collection Time: 01/23/22 10:55 PM  Result Value Ref Range   TSH 2.148 0.350 - 4.500 uIU/mL    Comment: Performed by a 3rd Generation assay with a functional sensitivity of <=0.01 uIU/mL. Performed at [facility], [address] [place], [place] [address]   Phosphorus     Status: Abnormal   Collection Time: 01/24/22  5:33 AM  Result Value Ref Range   Phosphorus 7.4 (H) 2.5 - 4.6 mg/dL    Comment: Performed at [facility], [address] [place], [place] [address]  Magnesium     Status: None   Collection Time: 01/24/22  5:33 AM  Result Value Ref Range   Magnesium 2.3 1.7 - 2.4 mg/dL    Comment: Performed at [facility], [address] [place], [place] [address]  Procalcitonin     Status: None   Collection Time: 01/24/22  5:33 AM  Result Value Ref Range   Procalcitonin 0.22 ng/mL    Comment:        Interpretation: PCT (Procalcitonin) <= 0.5 ng/mL: Systemic infection (sepsis) is not likely. Local bacterial infection is possible. (NOTE)       Sepsis PCT Algorithm           Lower Respiratory Tract                                      Infection PCT Algorithm    ----------------------------     ----------------------------         PCT < 0.25 ng/mL                PCT < 0.10 ng/mL          Strongly encourage             Strongly discourage   discontinuation of antibiotics    initiation of antibiotics    ----------------------------     -----------------------------       PCT 0.25 - 0.50 ng/mL            PCT 0.10 - 0.25 ng/mL  OR       >80% decrease in PCT            Discourage initiation of                                             antibiotics      Encourage discontinuation           of antibiotics    ----------------------------     -----------------------------         PCT >= 0.50 ng/mL              PCT 0.26 - 0.50 ng/mL               AND        <80% decrease in PCT             Encourage initiation of                                             antibiotics       Encourage continuation           of antibiotics    ----------------------------     -----------------------------        PCT >= 0.50 ng/mL                  PCT > 0.50 ng/mL               AND         increase in PCT                  Strongly encourage                                      initiation of antibiotics    Strongly encourage escalation           of antibiotics                                     -----------------------------                                           PCT <= 0.25 ng/mL                                                 OR                                        > 80% decrease in PCT                                      Discontinue / Do not initiate  antibiotics  Performed at [facility], [address] [place], [place] [address]   CBC     Status: Abnormal   Collection Time: 01/24/22  5:33 AM  Result Value Ref Range   WBC 11.4 (H) 4.0 - 10.5 K/uL   RBC 3.20 (L) 3.87 - 5.11 MIL/uL   Hemoglobin 8.5 (L) 12.0 - 15.0 g/dL   HCT 26.6 (L) 36.0 - 46.0 %   MCV 83.1 80.0 - 100.0 fL   MCH 26.6 26.0 - 34.0 pg   MCHC 32.0 30.0 - 36.0 g/dL   RDW 14.3 11.5 - 15.5 %   Platelets 306 150 - 400 K/uL   nRBC 0.0 0.0 - 0.2 %    Comment: Performed at [facility] [address] [place], [place] [address]  Basic metabolic panel     Status: Abnormal   Collection Time: 01/24/22  5:33 AM  Result Value Ref Range   Sodium 127 (L) 135 - 145 mmol/L   Potassium 5.4 (H) 3.5 - 5.1 mmol/L   Chloride 95 (L) 98 - 111 mmol/L   CO2 20 (L) 22 - 32 mmol/L   Glucose, Bld 125 (H) 70 - 99 mg/dL     Comment: Glucose reference range applies only to samples taken after fasting for at least 8 hours.   BUN 70 (H) 8 - 23 mg/dL   Creatinine, Ser 5.97 (H) 0.44 - 1.00 mg/dL   Calcium 8.3 (L) 8.9 - 10.3 mg/dL   GFR, Estimated 7 (L) >60 mL/min    Comment: (NOTE) Calculated using the CKD-EPI Creatinine Equation (2021)    Anion gap 12 5 - 15    Comment: Performed at [facility], [address] [place], [place] [address]  Aerobic/Anaerobic Culture w Gram Stain (surgical/deep wound)     Status: None (Preliminary result)   Collection Time: 01/24/22  5:04 PM   Specimen: Wound  Result Value Ref Range   Specimen Description      WOUND Performed at [facility] [address], [place], [place] [address]    Special Requests      LEFT FOOT Performed at [place], [place] [address]    Gram Stain      NO WBC SEEN FEW GRAM POSITIVE COCCI IN PAIRS FEW GRAM NEGATIVE RODS    Culture      TOO YOUNG TO READ Performed at [facility], [place] [address], [place], [place] [address]    Report Status PENDING   Procalcitonin     Status: None   Collection Time: 01/25/22  5:34 AM  Result Value Ref Range   Procalcitonin 0.39 ng/mL    Comment:        Interpretation: PCT (Procalcitonin) <= 0.5 ng/mL: Systemic infection (sepsis) is not likely. Local bacterial infection is possible. (NOTE)       Sepsis PCT Algorithm           Lower Respiratory Tract                                      Infection PCT Algorithm    ----------------------------     ----------------------------         PCT < 0.25 ng/mL                PCT < 0.10 ng/mL  Strongly encourage             Strongly discourage   discontinuation of antibiotics    initiation of antibiotics    ----------------------------     -----------------------------       PCT 0.25 - 0.50 ng/mL            PCT 0.10 - 0.25 ng/mL               OR       >80% decrease in PCT            Discourage  initiation of                                            antibiotics      Encourage discontinuation           of antibiotics    ----------------------------     -----------------------------         PCT >= 0.50 ng/mL              PCT 0.26 - 0.50 ng/mL               AND        <80% decrease in PCT             Encourage initiation of                                             antibiotics       Encourage continuation           of antibiotics    ----------------------------     -----------------------------        PCT >= 0.50 ng/mL                  PCT > 0.50 ng/mL               AND         increase in PCT                  Strongly encourage                                      initiation of antibiotics    Strongly encourage escalation           of antibiotics                                     -----------------------------                                           PCT <= 0.25 ng/mL                                                 OR                                        >  80% decrease in PCT                                      Discontinue / Do not initiate                                             antibiotics  Performed at [facility], [address] [place], [place] [address]   CBC     Status: Abnormal   Collection Time: 01/25/22  5:34 AM  Result Value Ref Range   WBC 10.5 4.0 - 10.5 K/uL   RBC 3.13 (L) 3.87 - 5.11 MIL/uL   Hemoglobin 8.4 (L) 12.0 - 15.0 g/dL   HCT 25.9 (L) 36.0 - 46.0 %   MCV 82.7 80.0 - 100.0 fL   MCH 26.8 26.0 - 34.0 pg   MCHC 32.4 30.0 - 36.0 g/dL   RDW 14.4 11.5 - 15.5 %   Platelets 302 150 - 400 K/uL   nRBC 0.0 0.0 - 0.2 %    Comment: Performed at [facility], [address], [place], [place] [address]  Basic metabolic panel     Status: Abnormal   Collection Time: 01/25/22  5:34 AM  Result Value Ref Range   Sodium 129 (L) 135 - 145 mmol/L   Potassium 4.6 3.5 - 5.1 mmol/L    Comment: HEMOLYSIS AT THIS LEVEL MAY AFFECT  RESULT   Chloride 92 (L) 98 - 111 mmol/L   CO2 21 (L) 22 - 32 mmol/L   Glucose, Bld 105 (H) 70 - 99 mg/dL    Comment: Glucose reference range applies only to samples taken after fasting for at least 8 hours.   BUN 57 (H) 8 - 23 mg/dL   Creatinine, Ser 4.53 (H) 0.44 - 1.00 mg/dL   Calcium 8.7 (L) 8.9 - 10.3 mg/dL   GFR, Estimated 10 (L) >60 mL/min    Comment: (NOTE) Calculated using the CKD-EPI Creatinine Equation (2021)    Anion gap 16 (H) 5 - 15    Comment: Performed at [facility], [address] [place], [place] [address]  Vancomycin, random     Status: None   Collection Time: 01/25/22  5:34 AM  Result Value Ref Range   Vancomycin Rm 10 ug/mL    Comment:        Random Vancomycin therapeutic range is dependent on dosage and time of specimen collection. A peak range is 20.0-40.0 ug/mL A trough range is 5.0-15.0 ug/mL        Performed at [facility], [address] [place], [place] [address]     No results found.  Review of Systems  Constitutional:  Positive for malaise/fatigue.  Skin:        Ulcer left foot   Blood pressure 122/86, pulse (!) 51, temperature 99.3 F (37.4 C), temperature source Oral, resp. rate 12, height '5\' 4"'$  (1.626 m), weight 133.8 kg, SpO2 98 %.  Vitals:   01/25/22 1800 01/25/22 1945  BP: 101/84 122/86  Pulse: 85 (!) 51  Resp: 20 12  Temp:  99.3 F (37.4 C)  SpO2: 92% 98%    General AA&O x3. Normal mood and affect.  Vascular Dorsalis pedis and posterior tibial pulses  present 1+ left  Capillary refill normal to all  digits. Pedal hair growth diminished.  Neurologic Epicritic sensation grossly absent.  Dermatologic (Wound) Full-thickness ulceration of left heel pictured below.  Necrotic foul-smelling drainage.  Some cellulitis surrounding this.  Dorsal eschar on anterior ankle  Orthopedic: Motor intact BLE.         Assessment/Plan:  Pressure ulcer of left heel -Imaging: Studies independently reviewed.   Realistically an MRI would be indicated to determine if there is further or residual or recurrent infection.  Her biopsy results from last surgery was negative for osteomyelitis.  We will await the goals of care discussion prior to proceeding with this.  Realistically if there is osteomyelitis within the heel her only option would likely be major limb amputation -Antibiotics: Continue broad-spectrum antibiotics -WB Status: NWB to the LLE for now -Elevate and offload heels with Prevalon boots -Wound Care: Order placed for Aquacel and Kerlix.  Dressing change today -Surgical Plan: Currently have no surgical plans -We will continue to follow  [name] 01/25/2022, 8:28 PM   Best available via secure chat for questions or concerns.

## 2022-01-25 NOTE — Progress Notes (Signed)
[facility]  ROUNDING NOTE   Subjective:   Patient resting quietly Remains on Levo Lower extremity edema improved.  Remains on room air  Sodium 129 Creatinine 4.53 UOP 315  Objective:  Vital signs in last 24 hours:  Temp:  [97.8 F (36.6 C)-98.7 F (37.1 C)] 97.8 F (36.6 C) (09/29 0942) Pulse Rate:  [40-124] 76 (09/29 1230) Resp:  [10-24] 15 (09/29 1230) BP: (72-145)/(37-102) 125/86 (09/29 1230) SpO2:  [88 %-100 %] 97 % (09/29 1230) Weight:  [135.1 kg-136.4 kg] 135.1 kg (09/29 0930)  Weight change: 1.7 kg Filed Weights   01/24/22 1445 01/24/22 1751 01/25/22 0930  Weight: (!) 136.4 kg 135.9 kg 135.1 kg    Intake/Output: I/O last 3 completed shifts: In: 453.4 [P.O.:50; I.V.:403.4] Out: 1365 [Urine:565; Other:800]   Intake/Output this shift:  No intake/output data recorded.  Physical Exam: General: NAD  Head: Normocephalic, atraumatic. Moist oral mucosal membranes  Eyes: Anicteric  Lungs:  Diminished in bases, normal effort  Heart: Regular rate and rhythm  Abdomen:  Soft, nontender, obese  Extremities:  trace peripheral edema.  Neurologic: Nonfocal, moving all four extremities  Skin: No lesions  Access: Lt IJ temp cath    Basic Metabolic Panel: Recent Labs  Lab 01/23/22 0937 01/23/22 1038 01/23/22 2255 01/24/22 0533 01/25/22 0534  NA 122*  --  125* 127* 129*  K 6.0*  --  5.1 5.4* 4.6  CL 92*  --  93* 95* 92*  CO2 16*  --  20* 20* 21*  GLUCOSE 82  --  120* 125* 105*  BUN 84*  --  65* 70* 57*  CREATININE 7.07*  --  5.48* 5.97* 4.53*  CALCIUM 8.3*  --  8.0* 8.3* 8.7*  MG  --  2.5* 2.2 2.3  --   PHOS  --   --   --  7.4*  --     Liver Function Tests: Recent Labs  Lab 01/23/22 0937  AST 21  ALT 22  ALKPHOS 64  BILITOT 0.8  PROT 6.5  ALBUMIN 3.3*   No results for input(s): "LIPASE", "AMYLASE" in the last 168 hours. No results for input(s): "AMMONIA" in the last 168 hours.  CBC: Recent Labs  Lab 01/23/22 0937 01/24/22 0533  01/25/22 0534  WBC 8.2 11.4* 10.5  NEUTROABS 7.1  --   --   HGB 8.4* 8.5* 8.4*  HCT 26.5* 26.6* 25.9*  MCV 84.4 83.1 82.7  PLT 243 306 302    Cardiac Enzymes: No results for input(s): "CKTOTAL", "CKMB", "CKMBINDEX", "TROPONINI" in the last 168 hours.  BNP: Invalid input(s): "POCBNP"  CBG: Recent Labs  Lab 01/23/22 1638  [place]*    Microbiology: Results for orders placed or performed during the hospital encounter of 01/23/22  Blood Culture (routine x 2)     Status: None (Preliminary result)   Collection Time: 01/23/22  9:37 AM   Specimen: BLOOD  Result Value Ref Range Status   Specimen Description BLOOD RIGHT ANTECUBITAL  Final   Special Requests   Final    BOTTLES DRAWN AEROBIC AND ANAEROBIC Blood Culture adequate volume   Culture   Final    NO GROWTH 2 DAYS Performed at [facility], [address], [place], [place] [address]    Report Status PENDING  Incomplete  Blood Culture (routine x 2)     Status: None (Preliminary result)   Collection Time: 01/23/22  9:37 AM   Specimen: BLOOD  Result Value Ref Range Status   Specimen  Description BLOOD LEFT ANTECUBITAL  Final   Special Requests   Final    BOTTLES DRAWN AEROBIC AND ANAEROBIC Blood Culture results may not be optimal due to an inadequate volume of blood received in culture bottles   Culture   Final    NO GROWTH 2 DAYS Performed at [facility], [address], [place], [place] [address]    Report Status PENDING  Incomplete  Urine Culture     Status: Abnormal   Collection Time: 01/23/22  9:37 AM   Specimen: In/Out Cath Urine  Result Value Ref Range Status   Specimen Description   Final    IN/OUT CATH URINE Performed at [facility], [address], [place], [place] [address]    Special Requests   Final    NONE Performed at [facility], [address] [place], [place] [address]    Culture >=100,000 COLONIES/mL PSEUDOMONAS AERUGINOSA (A)  Final   Report Status  01/25/2022 FINAL  Final   Organism ID, Bacteria PSEUDOMONAS AERUGINOSA (A)  Final      Susceptibility   Pseudomonas aeruginosa - MIC*    CEFTAZIDIME 2 SENSITIVE Sensitive     CIPROFLOXACIN <=0.25 SENSITIVE Sensitive     GENTAMICIN <=1 SENSITIVE Sensitive     IMIPENEM 2 SENSITIVE Sensitive     PIP/TAZO <=4 SENSITIVE Sensitive     CEFEPIME 4 SENSITIVE Sensitive     * >=100,000 COLONIES/mL PSEUDOMONAS AERUGINOSA  MRSA Next Gen by PCR, Nasal     Status: None   Collection Time: 01/23/22  4:54 PM   Specimen: Nasal Mucosa; Nasal Swab  Result Value Ref Range Status   MRSA by PCR Next Gen NOT DETECTED NOT DETECTED Final    Comment: (NOTE) The GeneXpert MRSA Assay (FDA approved for NASAL specimens only), is one component of a comprehensive MRSA colonization surveillance program. It is not intended to diagnose MRSA infection nor to guide or monitor treatment for MRSA infections. Test performance is not FDA approved in patients less than 32 years old. Performed at [facility], [address] [place], [place] [address]   Culture, blood (Routine X 2) w Reflex to ID Panel     Status: None (Preliminary result)   Collection Time: 01/23/22  5:02 PM   Specimen: BLOOD RIGHT HAND  Result Value Ref Range Status   Specimen Description BLOOD RIGHT HAND  Final   Special Requests   Final    BOTTLES DRAWN AEROBIC AND ANAEROBIC Blood Culture adequate volume   Culture   Final    NO GROWTH 2 DAYS Performed at [facility], [address], [place], [place] [address]    Report Status PENDING  Incomplete  Aerobic/Anaerobic Culture w Gram Stain (surgical/deep wound)     Status: None (Preliminary result)   Collection Time: 01/24/22  5:04 PM   Specimen: Wound  Result Value Ref Range Status   Specimen Description   Final    WOUND Performed at [facility], [place] [address], [place], [place] [address]    Special Requests   Final    LEFT FOOT Performed at [facility], [place], [place] [address]    Gram Stain   Final    NO WBC SEEN FEW GRAM POSITIVE COCCI IN PAIRS FEW GRAM NEGATIVE RODS    Culture   Final    TOO YOUNG TO READ Performed at [facility], [place] [address], [place], [place] [address]  Report Status PENDING  Incomplete    Coagulation Studies: Recent Labs    01/23/22 0937  LABPROT 15.9*  INR 1.3*    Urinalysis: No results for input(s): "COLORURINE", "LABSPEC", "PHURINE", "GLUCOSEU", "HGBUR", "BILIRUBINUR", "KETONESUR", "PROTEINUR", "UROBILINOGEN", "NITRITE", "LEUKOCYTESUR" in the last 72 hours.  Invalid input(s): "APPERANCEUR"    Imaging: ECHOCARDIOGRAM COMPLETE  Result Date: 01/24/2022    ECHOCARDIOGRAM REPORT   Patient Name:   JAYLIANI WANNER [facility] Date of Exam: 01/23/2022 Medical Rec #:  [id number]     Height:       64.0 in Accession #:    [id number]    Weight:       297.0 lb Date of Birth:  05-27-1950      BSA:          2.314 m Patient Age:    53 years      BP:           104/74 mmHg Patient Gender: F             HR:           85 bpm. Exam Location:  [facility] Procedure: 2D Echo, Cardiac Doppler and Color Doppler Indications:     D98.33 Acute Diastolic CHF  History:         Patient has prior history of Echocardiogram examinations, most                  recent 10/20/2019. COPD, Arrythmias:Atrial Fibrillation; Risk                  Factors:Hypotension.  Sonographer:     Cresenciano Lick RDCS Referring Phys:  [id number] Ottie Glazier Diagnosing Phys: Yolonda Kida MD IMPRESSIONS  1. Left ventricular ejection fraction, by estimation, is 50 to 55%. The left ventricle has low normal function. The left ventricle has no regional wall motion abnormalities. Left ventricular diastolic parameters were normal.  2. Right ventricular systolic function is normal. The right ventricular size is normal.  3. Left atrial size was mild to moderately dilated.  4. The mitral valve is normal in structure. No evidence of mitral valve regurgitation.  5.  The aortic valve is normal in structure. Aortic valve regurgitation is not visualized. FINDINGS  Left Ventricle: Left ventricular ejection fraction, by estimation, is 50 to 55%. The left ventricle has low normal function. The left ventricle has no regional wall motion abnormalities. The left ventricular internal cavity size was normal in size. There is borderline left ventricular hypertrophy. Left ventricular diastolic parameters were normal. Right Ventricle: The right ventricular size is normal. No increase in right ventricular wall thickness. Right ventricular systolic function is normal. Left Atrium: Left atrial size was mild to moderately dilated. Right Atrium: Right atrial size was normal in size. Pericardium: There is no evidence of pericardial effusion. Mitral Valve: The mitral valve is normal in structure. No evidence of mitral valve regurgitation. Tricuspid Valve: The tricuspid valve is normal in structure. Tricuspid valve regurgitation is trivial. Aortic Valve: The aortic valve is normal in structure. Aortic valve regurgitation is not visualized. Pulmonic Valve: The pulmonic valve was normal in structure. Pulmonic valve regurgitation is not visualized. Aorta: The ascending aorta was not well visualized. IAS/Shunts: No atrial level shunt detected by color flow Doppler.  LEFT VENTRICLE PLAX 2D LVIDd:         4.60 cm LVIDs:         3.40 cm LV PW:         1.20 cm LV IVS:  1.20 cm LVOT diam:     2.00 cm LVOT Area:     3.14 cm  RIGHT VENTRICLE RV Basal diam:  3.80 cm RV S prime:     15.00 cm/s LEFT ATRIUM             Index        RIGHT ATRIUM           Index LA diam:        5.00 cm 2.16 cm/m   RA Area:     10.80 cm LA Vol (A2C):   41.8 ml 18.06 ml/m  RA Volume:   25.40 ml  10.98 ml/m LA Vol (A4C):   27.9 ml 12.06 ml/m LA Biplane Vol: 37.0 ml 15.99 ml/m   AORTA Ao Root diam: 3.70 cm Ao Asc diam:  3.50 cm MV E velocity: 118.67 cm/s                             SHUNTS                              Systemic Diam: 2.00 cm Yolonda Kida MD Electronically signed by Yolonda Kida MD Signature Date/Time: 01/24/2022/1:21:41 PM    Final    DG Chest Port 1 View  Result Date: 01/23/2022 CLINICAL DATA:  Central line placement. EXAM: PORTABLE CHEST 1 VIEW COMPARISON:  Chest radiograph 01/23/2022 at 9:39 a.m. FINDINGS: Telemetry leads overlie the chest. A left jugular catheter has been placed and terminates over the upper SVC. The cardiomediastinal silhouette is unchanged and within normal limits for portable AP technique. The lungs remain hypoinflated. No focal airspace consolidation, overt pulmonary edema, sizable pleural effusion, or pneumothorax is identified. IMPRESSION: Interval left jugular catheter placement as above. Electronically Signed   By: Logan Bores M.D.   On: 01/23/2022 18:42   NM Pulmonary Perfusion  Result Date: 01/23/2022 CLINICAL DATA:  Shortness of breath. EXAM: NUCLEAR MEDICINE PERFUSION LUNG SCAN TECHNIQUE: Perfusion images were obtained in multiple projections after intravenous injection of radiopharmaceutical. Ventilation scans intentionally deferred if perfusion scan and chest x-ray adequate for interpretation during COVID 19 epidemic. RADIOPHARMACEUTICALS:  4.5 mCi Tc-7mMAA IV COMPARISON:  Chest x-ray 01/23/2022 FINDINGS: No segmental or subsegmental perfusion defects to suggest pulmonary embolism. IMPRESSION: Negative perfusion study for pulmonary embolism. Electronically Signed   By: PMarijo SanesM.D.   On: 01/23/2022 16:25     Medications:    anticoagulant sodium citrate     ceFEPime (MAXIPIME) IV 1 g (01/24/22 1828)   metronidazole 500 mg (01/24/22 2150)   norepinephrine (LEVOPHED) Adult infusion 4 mcg/min (01/25/22 0004)   vancomycin 1,000 mg (01/25/22 1211)    vitamin C  500 mg Oral BID   buPROPion  300 mg Oral Daily   Chlorhexidine Gluconate Cloth  6 each Topical Q0600   Chlorhexidine Gluconate Cloth  6 each Topical Q0600   clonazePAM  0.5 mg Oral BID    DULoxetine  60 mg Oral BH-q7a   feeding supplement (NEPRO CARB STEADY)  237 mL Oral TID BM   gabapentin  300 mg Oral QHS   heparin  5,000 Units Subcutaneous Q8H   ipratropium-albuterol  3 mL Nebulization BID   midodrine  10 mg Oral TID WC   multivitamin  1 tablet Oral QHS   traZODone  50 mg Oral QHS   vortioxetine HBr  5 mg Oral Daily  zinc sulfate  220 mg Oral Daily   alteplase, anticoagulant sodium citrate, benzonatate, heparin, lidocaine (PF), lidocaine-prilocaine, pentafluoroprop-tetrafluoroeth, polyethylene glycol, vancomycin  Assessment/ Plan:  [name] is a 71 y.o.  female with past medical conditions including COPD, depression, multiple sclerosis, peripheral vascular disease, hypertension, and ADHD, who was admitted to [facility] on 01/23/2022 for Hyponatremia [E87.1] Septic shock ([place]) [A41.9, R65.21] Acute renal failure, unspecified acute renal failure type ([place]) [N17.9] Cellulitis, unspecified cellulitis site [L03.90] Ulcer of left foot, unspecified ulcer stage ([place]) [L97.529]   Acute kidney injury with hyperkalemia on chronic kidney disease stage IIIa.  Baseline creatinine appears to be 1.27 with GFR 45 on 12/24/2021.  Acute kidney injury likely secondary to cardiorenal syndrome and hypotension.  No IV contrast exposure.  Potassium 6.0 on arrival along with increased creatinine 7.70.  Dialysis initiated on 01/23/22. Successful treatment yesterday. Patient receiving third treatment today, UF goal 1.5L. Next treatment planned for Monday, unless patient's respiratory status declines.   Lab Results  Component Value Date   CREATININE 4.53 (H) 01/25/2022   CREATININE 5.97 (H) 01/24/2022   CREATININE 5.48 (H) 01/23/2022    Intake/Output Summary (Last 24 hours) at 01/25/2022 1250 Last data filed at 01/25/2022 0600 Gross per 24 hour  Intake 162.2 ml  Output 815 ml  Net -652.8 ml   2.  Acute on chronic diastolic heart failure, decompensated.  Echo from 2021 shows EF 55 to 60%.   ECHO completed on 01/23/22 shows EF 50-55 %. Managing fluid volume with dialysis.   3. Anemia of chronic kidney disease  Lab Results  Component Value Date   HGB 8.4 (L) 01/25/2022    Hemoglobin stable today.   4. Hypotension due to sepsis.  All antihypertensives held.  Currently prescribed midodrine 10 mg 3 times daily.  Patient remains on small dose Levo.    LOS: 2   9/29/202312:50 PM

## 2022-01-25 NOTE — TOC Initial Note (Signed)
Transition of Care [facility]) - Initial/Assessment Note    Patient Details  Name: [name] MRN: [id number] Date of Birth: 1951/03/20  Transition of Care [facility]) CM/SW Contact:    Shelbie Hutching, RN Phone Number: 01/25/2022, 3:14 PM  Clinical Narrative:                 Patient admitted to the hospital for septic shock, came in from [facility], recently discharged from hospital to [facility] for short term rehab.  RNCM met with patient at the bedside.  She is requiring dialysis here in the hospital, which is new, her she and her family have decided that they would not want this to continue outpatient.   Patient reports that she lives with her husband at home, he is her primary caregiver, she says she has a walker at home, per previous hospital [facility] notes she is not very mobile at baseline.  Patient expresses that she would like to go home at discharge, she does not want to go to SNF.  Message left for her husband to return call. Palliative is following and patient and family may be interested in hospice if she will require long term dialysis.    TOC will cont to follow.  Palliative will follow up with family on Monday.   Expected Discharge Plan:  (TBD) Barriers to Discharge: Continued Medical Work up   Patient Goals and CMS Choice Patient states their goals for this hospitalization and ongoing recovery are:: Patient wants to go home at discharge      Expected Discharge Plan and Services Expected Discharge Plan:  (TBD)   Discharge Planning Services: CM Consult   Living arrangements for the past 2 months: Single Family Home                                      Prior Living Arrangements/Services Living arrangements for the past 2 months: Single Family Home Lives with:: Spouse Patient language and need for interpreter reviewed:: Yes Do you feel safe going back to the place where you live?: Yes      Need for Family Participation in Patient Care: Yes (Comment) Care giver support  system in place?: Yes (comment) Current home services: DME Criminal Activity/Legal Involvement Pertinent to Current Situation/Hospitalization: No - Comment as needed  Activities of Daily Living      Permission Sought/Granted Permission sought to share information with : Case Manager, Family Supports Permission granted to share information with : Yes, Verbal Permission Granted  Share Information with NAME: Meyah Corle     Permission granted to share info w Relationship: husband  Permission granted to share info w Contact Information: [phone]  Emotional Assessment Appearance:: Appears stated age Attitude/Demeanor/Rapport: Engaged Affect (typically observed): Accepting Orientation: : Oriented to Self, Oriented to Place Alcohol / Substance Use: Not Applicable Psych Involvement: No (comment)  Admission diagnosis:  Hyponatremia [E87.1] Septic shock (HCC) [A41.9, R65.21] Acute renal failure, unspecified acute renal failure type ([place]) [N17.9] Cellulitis, unspecified cellulitis site [L03.90] Ulcer of left foot, unspecified ulcer stage ([place]) [L97.529] Patient Active Problem List   Diagnosis Date Noted   Septic shock ([place]) 01/23/2022   Foot ulcer ([place]) 01/10/2022   Ulcer of left heel ([place])    Non healing left heel wound 12/22/2021   Femur fracture, left ([place]) 02/04/2020   Left leg weakness 01/26/2020   COPD (chronic obstructive pulmonary disease) ([place]) 01/01/2020   Foley catheter  in place on admission 11/19/2019   Neuropathy    Hyperlipidemia    Chronic kidney disease (CKD), stage III (moderate) ([place]) 04/20/2019   Ovarian mass, left 01/27/2019   Depression 05/13/2018   Recurrent cellulitis of lower extremity 06/25/2017   Medication monitoring encounter 06/05/2017   Cellulitis of left lower leg 04/25/2017   Adjustment disorder with mixed disturbance of emotions and conduct 04/23/2017   Left ovarian cyst 12/09/2016   Abdominal aortic atherosclerosis ([place]) 11/11/2016    Obstructive sleep apnea 52/77/8242   Follicular lymphoma of intra-abdominal lymph nodes ([place]) 08/06/2016   Obesity, Class III, BMI 40-49.9 (morbid obesity) ([place]) 01/05/2016   Skin ulcer ([place]) 11/08/2015   Peripheral vascular disease of lower extremity with ulceration ([place]) 11/08/2015   CKD (chronic kidney disease) stage 3, GFR 30-59 ml/min (HCC) 11/08/2015   Constipation due to pain medication 01/31/2015   Obstructive sleep apnea of adult 01/13/2015   Atonic neurogenic bladder 10/24/2014   COPD with bronchial hyperresponsiveness ([place]) 10/02/2013   Major depressive disorder, recurrent, in partial remission ([place]) 10/02/2013   Essential hypertension 10/02/2013   Multiple sclerosis ([place]) 10/02/2013   Absence of bladder continence 09/25/2012   Kyphoscoliosis and scoliosis 11/26/2011   PCP:  Kirk Ruths, MD Pharmacy:   [place], [place] [address] [place] [address] Phone: [phone] Fax: [place], [place] - [address] [place] [address] Phone: [phone] Fax: [phone]     Social Determinants of Health ([place]) Interventions    Readmission Risk Interventions    03/26/2021    4:21 PM 01/03/2020    3:37 PM  Readmission Risk Prevention Plan  Transportation Screening Complete Complete  PCP or Specialist Appt within 3-5 Days Complete   Social Work Consult for [place] Planning/Counseling Complete   Palliative Care Screening Complete   Medication Review Press photographer) Complete Complete  PCP or Specialist appointment within 3-5 days of discharge  Complete  HRI or Home Care Consult  Complete  SW Recovery Care/Counseling Consult  Complete  Palliative Care Screening  Not Applicable

## 2022-01-25 NOTE — Consult Note (Signed)
[place] for Electrolyte Monitoring and Replacement   Recent Labs: Potassium (mmol/L)  Date Value  01/25/2022 4.6  08/01/2013 3.5   Magnesium (mg/dL)  Date Value  01/24/2022 2.3   Calcium (mg/dL)  Date Value  01/25/2022 8.7 (L)   Calcium, Total (mg/dL)  Date Value  08/01/2013 8.9   Albumin (g/dL)  Date Value  01/23/2022 3.3 (L)  08/01/2013 4.1   Phosphorus (mg/dL)  Date Value  01/24/2022 7.4 (H)   Sodium (mmol/L)  Date Value  01/25/2022 129 (L)  08/01/2013 125 (L)     Assessment: 71yo F w/ h/o MDD/anxiety, legally blind, COPD on oxygen, DM, pancreatic insufficiency, SDH s/p craniotomy in July 2023, and chronic left heel ulceration c/b chronic osteomyelitis (Dx via MRI on 9/21) who presents with concern for possible sepsis c/b acute renal failure, electrolyte abnormalities and hypovolemic shock. Pharmacy consulted for mgmt of electrolytes.  Patient underwent emergent dialysis for renal failure c/b hyperkalemia on admission  Goal of Therapy:  Electrolytes within normal limits  Plan: --Na 122 >> 127 >> 129, continue to monitor. BNP also elevated which may indicate hypervolemic hyponatremia. Expect to continue to improve with HD --K 6 >> 5.4 >> 4.6 s/p HD x 2 --Will go ahead and discontinue electrolyte consult at this time. Do not anticipate patient will require electrolyte replacement in setting of renal dysfunction. Defer management of electrolytes to primary team and nephrology  Benita Gutter 01/25/2022 8:17 AM

## 2022-01-25 NOTE — Consult Note (Signed)
Consultation Note Date: 01/25/2022   Patient Name: [name]  DOB: [birth year]  MRN: [id number]  Age / Sex: 71 y.o., female  PCP: Kirk Ruths, MD Referring Physician: Jeani Hawking, MD  Reason for Consultation: Establishing goals of care  HPI/Patient Profile: 71 y.o. female  with past medical history of MS diagnosed at age 54, smoking history, COPD, OSA, lymphoma, and CKD admitted on 01/23/2022 with infected foot wound.  Patient with septic shock related to wound infection.  Patient with acute respiratory failure, acute heart failure, and renal failure requiring dialysis.  Patient requiring vasopressors.  PMT consulted to discuss goals of care.  Clinical Assessment and Goals of Care: I have reviewed medical records including EPIC notes, labs and imaging, received report from RN, assessed the patient and then met with patient and her spouse Tobin Chad to discuss diagnosis prognosis, [place], EOL wishes, disposition and options.  I introduced Palliative Medicine as specialized medical care for people living with serious illness. It focuses on providing relief from the symptoms and stress of a serious illness. The goal is to improve quality of life for both the patient and the family.  Patient's mental status waxes and wanes throughout conversation.  She seems to have moments of clarity but other times seems somewhat confused.  Spouse confirms this is consistent throughout this hospitalization.  Spouse serves as Ambulance person.  Spouse shares he serves as her primary caregiver.  At baseline patient is mostly wheelchair-bound but can occasionally ambulate with walker.  Spouse assists with most ADLs.   We discussed patient's current illness and what it means in the larger context of patient's on-going co-morbidities.  Natural disease trajectory and expectations at EOL were discussed.We discussed patient's recent hospitalizations.  We  discussed her discharge to rehab but then required readmission.  We discussed her foot wound.  We discussed her heart and kidney failure.  We discussed her current need for dialysis.  I attempted to elicit values and goals of care important to the patient.  Patient and spouse both agree they would never want long-term dialysis.  Encouraged patient/family to consider DNR/DNI status understanding evidenced based poor outcomes in similar hospitalized patients, as the cause of the arrest is likely associated with chronic/terminal disease rather than a reversible acute cardio-pulmonary event.  Patient and spouse both agreed to DNR/DNI status.   The difference between aggressive medical intervention and comfort care was considered in light of the patient's goals of care.  Patient's spouse seem to be considering a transition to comfort approach and they bring up hospice.  They tell me they would like some time to see how things go and also time to discuss decision with her children.   Discussed with patient and spouse the importance of continued conversation with family and the medical providers regarding overall plan of care and treatment options, ensuring decisions are within the context of the patients values and GOCs.    Hospice and Palliative Care services outpatient were explained and offered.  We discussed both hospice support at home as well as hospice facility.  Discussed determining which 1 is most appropriate will depend on hospital course.  Ultimately we decided on a plan of setting limits of DNR and no long-term dialysis but otherwise continue current measures and see how the weekend goes.  They would like for palliative team to follow-up with them on Monday.  Questions and concerns were addressed. The family was encouraged to call with questions or concerns.  Primary Decision Maker PATIENT  joined by spouse as her mental status waxes/wanes    SUMMARY OF RECOMMENDATIONS   -CODE STATUS  changed to DNR/DNI -Patient and spouse agreeable to continue intermittent dialysis for now but would never want long-term dialysis -Patient's spouse considering more of a comfort approach and hospice involvement -Plan made to continue current interventions for now and see how the weekend goes with palliative team to follow-up Monday  Code Status/Advance Care Planning: DNR     Primary Diagnoses: Present on Admission:  Septic shock ([place])   I have reviewed the medical record, interviewed the patient and family, and examined the patient. The following aspects are pertinent.  Past Medical History:  Diagnosis Date   Abdominal aortic atherosclerosis ([facility]) 11/11/2016   ADHD    Anxiety    COPD (chronic obstructive pulmonary disease) ([facility])    Depression    major depressive   Dyspnea    doe   Edema    left leg   Follicular lymphoma ([facility])    B Cell   Follicular lymphoma grade II ([facility])    Hypertension    Hypotension    idiopathic   Kyphoscoliosis and scoliosis 11/26/2011   Morbid obesity ([place]) 01/05/2016   Multiple sclerosis (H[facility])    Multiple sclerosis ([place])    1980's   Neuromuscular disorder ([place])    Obstructive and reflux uropathy    foley   Pain    atypical facial   Peripheral vascular disease of lower extremity with ulceration ([place]) 11/08/2015   Skin ulcer ([place]) 11/08/2015   Weakness    generalized. has MS   Social History   Socioeconomic History   Marital status: Married    Spouse name: Not on file   Number of children: 3   Years of education: Not on file   Highest education level: Not on file  Occupational History   Occupation: disabled  Tobacco Use   Smoking status: Former    Packs/day: 1.00    Years: 20.00    Total pack years: 20.00    Types: Cigarettes    Start date: 04/30/1995    Quit date: 02/03/2016    Years since quitting: 5.9   Smokeless tobacco: Never  Vaping Use   Vaping Use: Some days  Substance and Sexual Activity   Alcohol use: No     Alcohol/week: 0.0 standard drinks of alcohol   Drug use: Yes    Types: Marijuana    Comment: smokes THC occasionally per pt    Sexual activity: Not Currently    Birth control/protection: None  Other Topics Concern   Not on file  Social History Narrative   She is married.    Social Determinants of Health   Financial Resource Strain: Not on file  Food Insecurity: No Food Insecurity (01/10/2022)   Hunger Vital Sign    Worried About Running Out of Food in the Last Year: Never true    Ran Out of Food in the Last Year: Never true  Transportation Needs: Unmet Transportation Needs (01/10/2022)   PRAPARE - Hydrologist (Medical): Yes    Lack of Transportation (Non-Medical): Yes  Physical Activity: Not on file  Stress: Not on file  Social Connections: Not on file   Family History  Problem Relation Age of Onset   COPD Mother    Diabetes Mother    Heart failure Mother    Alcohol abuse Father    Kidney disease Father    Kidney failure Father  Arthritis Sister    CAD Maternal Grandmother    Stroke Maternal Grandfather    Arthritis Sister    Mental illness Sister    Arthritis Brother    Scheduled Meds:  vitamin C  500 mg Oral BID   buPROPion  300 mg Oral Daily   Chlorhexidine Gluconate Cloth  6 each Topical Q0600   Chlorhexidine Gluconate Cloth  6 each Topical Q0600   clonazePAM  0.5 mg Oral BID   DULoxetine  60 mg Oral BH-q7a   feeding supplement (NEPRO CARB STEADY)  237 mL Oral TID BM   gabapentin  300 mg Oral QHS   heparin  5,000 Units Subcutaneous Q8H   ipratropium-albuterol  3 mL Nebulization BID   midodrine  10 mg Oral TID WC   multivitamin  1 tablet Oral QHS   traZODone  50 mg Oral QHS   vortioxetine HBr  5 mg Oral Daily   zinc sulfate  220 mg Oral Daily   Continuous Infusions:  anticoagulant sodium citrate     ceFEPime (MAXIPIME) IV Stopped (01/24/22 1858)   metronidazole 500 mg (01/25/22 1321)   norepinephrine (LEVOPHED) Adult  infusion 4 mcg/min (01/25/22 1321)   vancomycin Stopped (01/25/22 1229)   PRN Meds:.alteplase, anticoagulant sodium citrate, benzonatate, heparin, lidocaine (PF), lidocaine-prilocaine, pentafluoroprop-tetrafluoroeth, polyethylene glycol, vancomycin No Known Allergies Review of Systems  Constitutional:  Positive for activity change and fatigue.  Neurological:  Positive for weakness.    Physical Exam Constitutional:      General: She is not in acute distress.    Appearance: She is ill-appearing.  Pulmonary:     Effort: Pulmonary effort is normal.  Musculoskeletal:     Comments: Bandage to left lower extremity wound with drainage  Skin:    General: Skin is warm and dry.  Neurological:     Mental Status: She is alert and oriented to person, place, and time.     Comments: Some confusion throughout conversation     Vital Signs: BP 115/77   Pulse 77   Temp 97.7 F (36.5 C) (Oral)   Resp 13   Ht 5' 4" (1.626 m)   Wt 133.8 kg   SpO2 95%   BMI 50.63 kg/m  Pain Scale: 0-10   Pain Score: 0-No pain   SpO2: SpO2: 95 % O2 Device:SpO2: 95 % O2 Flow Rate: .O2 Flow Rate (L/min): 2 L/min  IO: Intake/output summary:  Intake/Output Summary (Last 24 hours) at 01/25/2022 1455 Last data filed at 01/25/2022 1321 Gross per 24 hour  Intake 817.62 ml  Output 2115 ml  Net -1297.38 ml    LBM: Last BM Date : 01/25/22 Baseline Weight: Weight: 134.7 kg Most recent weight: Weight: 133.8 kg     Palliative Assessment/Data: PPS 40%     *Please note that this is a verbal dictation therefore any spelling or grammatical errors are due to the "[facility]" system interpretation.  Juel Burrow, DNP, AGNP-C Palliative Medicine Team [phone] Pager: [phone]

## 2022-01-25 NOTE — Plan of Care (Signed)
  Problem: Clinical Measurements: Goal: Ability to maintain clinical measurements within normal limits will improve Outcome: Progressing Goal: Will remain free from infection Outcome: Progressing Goal: Diagnostic test results will improve Outcome: Progressing Goal: Respiratory complications will improve Outcome: Progressing Goal: Cardiovascular complication will be avoided Outcome: Progressing   Problem: Nutrition: Goal: Adequate nutrition will be maintained Outcome: Progressing   Problem: Coping: Goal: Level of anxiety will decrease Outcome: Progressing   Problem: Pain Managment: Goal: General experience of comfort will improve Outcome: Progressing   Problem: Safety: Goal: Ability to remain free from injury will improve Outcome: Progressing

## 2022-01-25 NOTE — Progress Notes (Signed)
NAME:  [name], MRN:  [id number], DOB:  1950/06/26, LOS: 2 ADMISSION DATE:  01/23/2022, CONSULTATION DATE:  01/23/2022 REFERRING MD:  Dr. Starleen Blue, CHIEF COMPLAINT:  Concern for sepsis   Brief Pt Description / Synopsis:  71 year old female admitted with severe sepsis with septic shock in the setting of Pseudomonas UTI and suspected infected left foot ulceration/cellulitis. Podiatry consulted.  Also with AKI on CKD requiring initiation of hemodialysis.    History of Present Illness:  71 year old female with a long standing history of MS from age 50 background history of smoking tobacco she quit in her mid 3s as well as intermittent THC smoking per her husband.  Patient is dependent on most ADLs for family including showering and bathing as well as cooking and cleaning and taking care of herself.  Husband states at times even at baseline she does not participate in verbal communication whereas other times she improves and is able to be partially functional.  She was sent here from rehab facility where she was recovering after having surgery to left lower extremity for cellulitis.  She does have a background history of COPD and sleep apnea, follicular intra-abdominal lymphoma, CKD stage III, bladder and bowel incontinence, major depressive disorder obesity class IV with BMI over 40.  In the ED she appears to be in circulatory shock requiring vasopressor support with swollen lower extremities and excoriated wound with mucopurulent areas.  Blood work with elevated BNP over 1200 suggestive of acute decompensated CHF exacerbation.  Pulmonary critical care consultation placed for additional evaluation management of patient in circulatory shock requiring vasopressor support.  Her x-ray shows bilateral diffuse infiltrates both airspace and interstitial opacification consistent with likely pulmonary edema due to CHF/CKD.  Pertinent  Medical History   Past Medical History:  Diagnosis Date   Abdominal aortic  atherosclerosis ([place]) 11/11/2016   ADHD    Anxiety    COPD (chronic obstructive pulmonary disease) (HCC)    Depression    major depressive   Dyspnea    doe   Edema    left leg   Follicular lymphoma (HCC)    B Cell   Follicular lymphoma grade II (HCC)    Hypertension    Hypotension    idiopathic   Kyphoscoliosis and scoliosis 11/26/2011   Morbid obesity ([place]) 01/05/2016   Multiple sclerosis (HCC)    Multiple sclerosis (HCC)    1980's   Neuromuscular disorder ([place])    Obstructive and reflux uropathy    foley   Pain    atypical facial   Peripheral vascular disease of lower extremity with ulceration ([place]) 11/08/2015   Skin ulcer ([place]) 11/08/2015   Weakness    generalized. has MS    Micro Data:  9/27: Blood culture x2>> no growth to date 9/27: Hepatitis panel>> nonreactive 9/27: Urine>> PSEUDOMONAS AERUGINOSA  9/28: Left foot wound>> gram-positive cocci, gram-negative rods  Antimicrobials:  Zosyn 9/27 x1 dose Vancomycin 9/27>> Cefepime 9/28>> Flagyl 9/28>>  Significant Hospital Events: Including procedures, antibiotic start and stop dates in addition to other pertinent events   01/23/22: PCCM asked to admit.  Cardiology and Nephrology consulted. Temporary HD Catheter placed, started on Hemodialysis. 01/24/22- patient did well overnight with improved mentation. We had advanced her diet. Shes off levophed.  Podiatry consult and wound care is on board for left foot wounds.  She had HD overnight no issues.  01/25/22: Remains on low dose Levophed.  Urine culture with pseudomonas, left foot wound with gram + cocci and gram -  rods.  To get HD today.  Palliative care consulted to assist with goals of care.  Interim History / Subjective:  -No significant events noted overnight -Afebrile, still on low dose Levophed (4 mcg), bradycardic but awake and alert and not symptomatic -To get hemodialysis again today -Pt reports she did not sleep well, complaining of dry cough ~ home meds for  anxiety/depression resumed last night -Urine culture with pseudomonas, left foot wound with gram + cocci and gram - rods. -Podiatry to see pt today -Palliative care to see patient to assist with goals of care  Objective   Blood pressure 113/60, pulse (!) 49, temperature 98.6 F (37 C), temperature source Oral, resp. rate 11, height '5\' 4"'$  (1.626 m), weight 135.9 kg, SpO2 95 %.        Intake/Output Summary (Last 24 hours) at 01/25/2022 0834 Last data filed at 01/25/2022 0600 Gross per 24 hour  Intake 162.2 ml  Output 815 ml  Net -652.8 ml   Filed Weights   01/24/22 0500 01/24/22 1445 01/24/22 1751  Weight: 132.1 kg (!) 136.4 kg 135.9 kg    Examination: General: Acute on chronically ill-appearing female, sitting in bed, on room air, no acute distress HENT: Atraumatic, normocephalic, neck supple, difficult to assess JVD due to body habitus Lungs: Coarse breath sounds bilaterally, even, nonlabored Cardiovascular: Bradycardia, regular rhythm, S1-S2, no murmurs, rubs, gallops Abdomen: Obese, soft, nontender, nondistended, no guarding rebound tenderness, bowel sounds positive x4 Extremities: Cellulitis of left lower extremity, and infected appearing left foot wound (see images below) Neuro: Awake, alert and oriented x3, moves all extremities to command, no focal deficits GU: Foley catheter in place       Resolved Hospital Problem list     Assessment & Plan:   Septic shock Bradycardia, pt currently not symptomatic (? R/t hyperkalemia vs Midodrine) Acute decompensated HFpEF ?  Adrenal insufficiency ~ low suspicion as cortisol normal Echocardiogram 01/23/2022: LVEF 50 to 60%, normal diastolic parameters, normal RV systolic function. -Continuous cardiac monitoring -Maintain MAP >65 -Vasopressors as needed to maintain MAP goal -Continue midodrine -Lactic acid normal -HS Troponin negative -TSH and cortisol normal -Hemodialysis for volume removal -Cardiology following,  appreciate input  Severe sepsis in the setting of Pseudomonas UTI and suspected infected left foot ulceration/cellulitis -Monitor fever curve -Trend WBC's & Procalcitonin -Follow cultures as above -Continue empiric cefepime, Flagyl, vancomycin pending cultures & sensitivities -Podiatry and wound care consulted ~appreciate input -Foley catheter was exchanged 9/28  AKI superimposed on CKD stage V Hyperkalemia Hyponatremia -Monitor I&O's / urinary output -Follow BMP -Ensure adequate renal perfusion -Avoid nephrotoxic agents as able -Replace electrolytes as indicated -Nephrology following, appreciate input -Hemodialysis as per nephrology  Chronic MS - patient is at times non verbal and is fully dependent on family for all ADLs  - continue home- [employer]ell[employer]   Best Practice (right click and "Reselect all SmartList Selections" daily)   Diet/type: Regular consistency (see orders) DVT prophylaxis: prophylactic heparin  GI prophylaxis: N/A Lines: Dialysis Catheter and yes and it is still needed Foley:  Yes, and it is still needed Code Status:  full code Last date of multidisciplinary goals of care discussion [01/25/2022]  Will update pt's husband when he arrives at bedside.  Labs   CBC: Recent Labs  Lab 01/23/22 0937 01/24/22 0533 01/25/22 0534  WBC 8.2 11.4* 10.5  NEUTROABS 7.1  --   --   HGB 8.4* 8.5* 8.4*  HCT 26.5* 26.6* 25.9*  MCV 84.4 83.1 82.7  PLT 243 306  601    Basic Metabolic Panel: Recent Labs  Lab 01/23/22 0937 01/23/22 1038 01/23/22 2255 01/24/22 0533 01/25/22 0534  NA 122*  --  125* 127* 129*  K 6.0*  --  5.1 5.4* 4.6  CL 92*  --  93* 95* 92*  CO2 16*  --  20* 20* 21*  GLUCOSE 82  --  120* 125* 105*  BUN 84*  --  65* 70* 57*  CREATININE 7.07*  --  5.48* 5.97* 4.53*  CALCIUM 8.3*  --  8.0* 8.3* 8.7*  MG  --  2.5* 2.2 2.3  --   PHOS  --   --   --  7.4*  --    GFR: Estimated Creatinine Clearance: 15.7 mL/min (A) (by C-G formula based on SCr of  4.53 mg/dL (H)). Recent Labs  Lab 01/23/22 0937 01/24/22 0533 01/25/22 0534  PROCALCITON 0.28 0.22 0.39  WBC 8.2 11.4* 10.5  LATICACIDVEN 0.7  --   --     Liver Function Tests: Recent Labs  Lab 01/23/22 0937  AST 21  ALT 22  ALKPHOS 64  BILITOT 0.8  PROT 6.5  ALBUMIN 3.3*   No results for input(s): "LIPASE", "AMYLASE" in the last 168 hours. No results for input(s): "AMMONIA" in the last 168 hours.  ABG    Component Value Date/Time   HCO3 28.5 (H) 02/06/2017 1714     Coagulation Profile: Recent Labs  Lab 01/23/22 0937  INR 1.3*    Cardiac Enzymes: No results for input(s): "CKTOTAL", "CKMB", "CKMBINDEX", "TROPONINI" in the last 168 hours.  HbA1C: Hgb A1c MFr Bld  Date/Time Value Ref Range Status  01/26/2020 03:03 PM 5.4 4.8 - 5.6 % Final    Comment:    (NOTE) Pre diabetes:          5.7%-6.4%  Diabetes:              >6.4%  Glycemic control for   <7.0% adults with diabetes   06/20/2019 06:27 PM 5.0 4.8 - 5.6 % Final    Comment:    (NOTE) Pre diabetes:          5.7%-6.4% Diabetes:              >6.4% Glycemic control for   <7.0% adults with diabetes     CBG: Recent Labs  Lab 01/23/22 1638  GLUCAP 102*    Review of Systems:   Positives in BOLD: Gen: Denies fever, chills, weight change, fatigue, night sweats HEENT: Denies blurred vision, double vision, hearing loss, tinnitus, sinus congestion, rhinorrhea, sore throat, neck stiffness, dysphagia PULM: Denies shortness of breath, cough, sputum production, hemoptysis, wheezing CV: Denies chest pain, edema, orthopnea, paroxysmal nocturnal dyspnea, palpitations GI: Denies abdominal pain, nausea, vomiting, diarrhea, hematochezia, melena, constipation, change in bowel habits GU: Denies dysuria, hematuria, polyuria, oliguria, urethral discharge Endocrine: Denies hot or cold intolerance, polyuria, polyphagia or appetite change Derm: Denies rash, dry skin, scaling or peeling skin change Heme: Denies easy  bruising, bleeding, bleeding gums Neuro: Denies headache, numbness, weakness, slurred speech, loss of memory or consciousness   Past Medical History:  She,  has a past medical history of Abdominal aortic atherosclerosis ([place]) (11/11/2016), ADHD, Anxiety, COPD (chronic obstructive pulmonary disease) ([place]), Depression, Dyspnea, Edema, Follicular lymphoma ([place]), Follicular lymphoma grade II ([place]), Hypertension, Hypotension, Kyphoscoliosis and scoliosis (11/26/2011), Morbid obesity ([place]) (01/05/2016), Multiple sclerosis ([place]), Multiple sclerosis ([place]), Neuromuscular disorder ([place]), Obstructive and reflux uropathy, Pain, Peripheral vascular disease of lower extremity with ulceration ([place]) (11/08/2015), Skin ulcer (  [place]) (11/08/2015), and Weakness.   Surgical History:   Past Surgical History:  Procedure Laterality Date   BACK SURGERY N/A 2002   BONE BIOPSY Left 12/24/2021   Procedure: BONE BIOPSY-HEEL BONE;  Surgeon: Trula Slade, DPM;  Location: [facility];  Service: Podiatry;  Laterality: Left;   CYST EXCISION     lower back   INGUINAL LYMPH NODE BIOPSY Left 07/04/2016   Procedure: INGUINAL LYMPH NODE BIOPSY;  Surgeon: Christene Lye, MD;  Location: [facility];  Service: General;  Laterality: Left;   ORIF FEMUR FRACTURE Left 02/04/2020   Procedure: OPEN REDUCTION INTERNAL FIXATION (ORIF) DISTAL FEMUR FRACTURE;  Surgeon: Shona Needles, MD;  Location: [place];  Service: Orthopedics;  Laterality: Left;   PORTACATH PLACEMENT N/A 07/22/2016   Procedure: INSERTION PORT-A-CATH;  Surgeon: Christene Lye, MD;  Location: [facility];  Service: General;  Laterality: N/A;   TONSILLECTOMY AND ADENOIDECTOMY     TUBAL LIGATION     WOUND DEBRIDEMENT Left 12/24/2021   Procedure: DEBRIDEMENT WOUND-HEEL ULCER;  Surgeon: Trula Slade, DPM;  Location: [facility];  Service: Podiatry;  Laterality: Left;     Social History:   reports that she quit smoking about 5 years ago. Her smoking use included cigarettes.  She started smoking about 26 years ago. She has a 20.00 pack-year smoking history. She has never used smokeless tobacco. She reports current drug use. Drug: Marijuana. She reports that she does not drink alcohol.   Family History:  Her family history includes Alcohol abuse in her father; Arthritis in her brother, sister, and sister; CAD in her maternal grandmother; COPD in her mother; Diabetes in her mother; Heart failure in her mother; Kidney disease in her father; Kidney failure in her father; Mental illness in her sister; Stroke in her maternal grandfather.   Allergies No Known Allergies   Home Medications  Prior to Admission medications   Medication Sig Start Date End Date Taking? Authorizing Provider  amLODipine (NORVASC) 5 MG tablet Take 5 mg by mouth daily. 12/09/21  Yes [provider]  atorvastatin (LIPITOR) 10 MG tablet Take 10 mg by mouth at bedtime.  01/14/19  Yes [provider]  budesonide-formoterol (SYMBICORT) 160-4.5 MCG/ACT inhaler Inhale 2 puffs into the lungs 2 (two) times daily.    Yes [provider]  buPROPion (WELLBUTRIN XL) 300 MG 24 hr tablet Take 300 mg by mouth daily. 03/21/21  Yes [provider]  cephALEXin (KEFLEX) 500 MG capsule Take 1 capsule (500 mg total) by mouth 4 (four) times daily for 10 days. 01/17/22 01/27/22 Yes Bradler, Vista Lawman, MD  clonazePAM (KLONOPIN) 0.5 MG tablet Take 1 tablet (0.5 mg total) by mouth 2 (two) times daily. 01/12/22  Yes Jennye Boroughs, MD  DULoxetine (CYMBALTA) 60 MG capsule Take 1 capsule (60 mg total) by mouth every morning. 10/14/16  Yes Vaughan Basta, MD  gabapentin (NEURONTIN) 600 MG tablet Take 600 mg by mouth 3 (three) times daily. 12/10/21  Yes [provider]  lisinopril (ZESTRIL) 20 MG tablet Take 1 tablet (20 mg total) by mouth daily. 12/26/21  Yes Jennye Boroughs, MD  Multiple Vitamin (MULTIVITAMIN WITH MINERALS) TABS tablet Take 1 tablet by mouth daily.   Yes [provider]  MYRBETRIQ 50 MG TB24 tablet TAKE ONE TABLET BY MOUTH ONCE DAILY 04/19/20  Yes Stoioff, Ronda Fairly, MD  nystatin powder Apply 1 Application topically 2 (two) times daily.   Yes [provider]  QUEtiapine Fumarate (SEROQUEL XR) 150 MG  24 hr tablet Take 150 mg by mouth at bedtime.    Yes [provider]  sulfamethoxazole-trimethoprim (BACTRIM DS) 800-160 MG tablet Take 1 tablet by mouth 2 (two) times daily for 10 days. 01/17/22 01/27/22 Yes Naaman Plummer, MD  traZODone (DESYREL) 50 MG tablet Take 50 mg by mouth at bedtime. 03/21/21  Yes [provider]  vortioxetine HBr (TRINTELLIX) 5 MG TABS tablet Take 5 mg by mouth daily.   Yes [provider]  acetaminophen (TYLENOL) 500 MG tablet Take 1,000 mg by mouth every 8 (eight) hours as needed for moderate pain.    [provider]  docusate sodium (COLACE) 100 MG capsule Take 1 capsule (100 mg total) by mouth 2 (two) times daily as needed. 11/24/19   Enzo Bi, MD  hydrALAZINE (APRESOLINE) 50 MG tablet Take 50 mg by mouth 3 (three) times daily. Patient not taking: Reported on 01/23/2022 12/12/21   [provider]  magnesium oxide (MAG-OX) 400 MG tablet Take 400 mg by mouth daily. Patient not taking: Reported on 01/23/2022    [provider]  torsemide (DEMADEX) 20 MG tablet Take 1 tablet (20 mg total) by mouth daily as needed (edema). 07/05/21   Lorella Nimrod, MD     Critical care time: 40 minutes     Darel Hong, AGACNP-BC [facility] Prefer epic messenger for cross cover needs If after hours, please call E-link

## 2022-01-25 NOTE — Progress Notes (Signed)
Hd tx of 3hrs completed. 60.4L total vol processed. No complications. Report given to [place] young, rn. Total uf removed: 1310m Post hd wt: 133.8kg  Post hd v/s: 97.7 139/69(82) 76 11 98%

## 2022-01-26 LAB — CBC
HCT: 23.4 % — ABNORMAL LOW (ref 36.0–46.0)
Hemoglobin: 7.6 g/dL — ABNORMAL LOW (ref 12.0–15.0)
MCH: 27 pg (ref 26.0–34.0)
MCHC: 32.5 g/dL (ref 30.0–36.0)
MCV: 83 fL (ref 80.0–100.0)
Platelets: 226 10*3/uL (ref 150–400)
RBC: 2.82 MIL/uL — ABNORMAL LOW (ref 3.87–5.11)
RDW: 14.1 % (ref 11.5–15.5)
WBC: 7.5 10*3/uL (ref 4.0–10.5)
nRBC: 0 % (ref 0.0–0.2)

## 2022-01-26 LAB — BASIC METABOLIC PANEL
Anion gap: 7 (ref 5–15)
BUN: 39 mg/dL — ABNORMAL HIGH (ref 8–23)
CO2: 27 mmol/L (ref 22–32)
Calcium: 8.4 mg/dL — ABNORMAL LOW (ref 8.9–10.3)
Chloride: 99 mmol/L (ref 98–111)
Creatinine, Ser: 2.54 mg/dL — ABNORMAL HIGH (ref 0.44–1.00)
GFR, Estimated: 20 mL/min — ABNORMAL LOW (ref >60)
Glucose, Bld: 91 mg/dL (ref 70–99)
Potassium: 3.4 mmol/L — ABNORMAL LOW (ref 3.5–5.1)
Sodium: 133 mmol/L — ABNORMAL LOW (ref 135–145)

## 2022-01-26 NOTE — Progress Notes (Addendum)
NAME:  [name], MRN:  [id number], DOB:  Jul 01, 1950, LOS: 3 ADMISSION DATE:  01/23/2022, CONSULTATION DATE:  01/23/2022 REFERRING MD:  Dr. Starleen Blue, CHIEF COMPLAINT:  Concern for sepsis   Brief Pt Description / Synopsis:  71 year old female admitted with severe sepsis with septic shock in the setting of Pseudomonas UTI and suspected infected left foot ulceration/cellulitis. Podiatry consulted.  Also with AKI on CKD requiring initiation of hemodialysis.    History of Present Illness:  71 year old female with a long standing history of MS from age 15 background history of smoking tobacco she quit in her mid 35s as well as intermittent THC smoking per her husband.  Patient is dependent on most ADLs for family including showering and bathing as well as cooking and cleaning and taking care of herself.  Husband states at times even at baseline she does not participate in verbal communication whereas other times she improves and is able to be partially functional.  She was sent here from rehab facility where she was recovering after having surgery to left lower extremity for cellulitis.  She does have a background history of COPD and sleep apnea, follicular intra-abdominal lymphoma, CKD stage III, bladder and bowel incontinence, major depressive disorder obesity class IV with BMI over 40.  In the ED she appears to be in circulatory shock requiring vasopressor support with swollen lower extremities and excoriated wound with mucopurulent areas.  Blood work with elevated BNP over 1200 suggestive of acute decompensated CHF exacerbation.  Pulmonary critical care consultation placed for additional evaluation management of patient in circulatory shock requiring vasopressor support.  Her x-ray shows bilateral diffuse infiltrates both airspace and interstitial opacification consistent with likely pulmonary edema due to CHF/CKD.  Pertinent  Medical History   Past Medical History:  Diagnosis Date   Abdominal aortic  atherosclerosis ([place]) 11/11/2016   ADHD    Anxiety    COPD (chronic obstructive pulmonary disease) (HCC)    Depression    major depressive   Dyspnea    doe   Edema    left leg   Follicular lymphoma (HCC)    B Cell   Follicular lymphoma grade II (HCC)    Hypertension    Hypotension    idiopathic   Kyphoscoliosis and scoliosis 11/26/2011   Morbid obesity ([place]) 01/05/2016   Multiple sclerosis (HCC)    Multiple sclerosis (HCC)    1980's   Neuromuscular disorder ([place])    Obstructive and reflux uropathy    foley   Pain    atypical facial   Peripheral vascular disease of lower extremity with ulceration ([place]) 11/08/2015   Skin ulcer ([place]) 11/08/2015   Weakness    generalized. has MS    Micro Data:  9/27: Blood culture x2>> no growth to date 9/27: Hepatitis panel>> nonreactive 9/27: Urine>> PSEUDOMONAS AERUGINOSA  9/28: Left foot wound>> gram-positive cocci, gram-negative rods  Antimicrobials:  Zosyn 9/27 x1 dose Vancomycin 9/27>> Cefepime 9/28>> Flagyl 9/28>>  Significant Hospital Events: Including procedures, antibiotic start and stop dates in addition to other pertinent events   01/23/22: PCCM asked to admit.  Cardiology and Nephrology consulted. Temporary HD Catheter placed, started on Hemodialysis. 01/24/22- patient did well overnight with improved mentation. We had advanced her diet. Shes off levophed.  Podiatry consult and wound care is on board for left foot wounds.  She had HD overnight no issues.  01/25/22: Remains on low dose Levophed.  Urine culture with pseudomonas, left foot wound with gram + cocci and gram -  rods.  To get HD today.  Palliative care consulted to assist with goals of care.  Code status changed to DNR/DNI 01/26/22:  Weaned off Levophed earlier this morning, Leukocytosis continues to improve.  After discussion with Palliative yesterday, pt and family considering more of comfort approach with hospice involvement, but continuing current interventions for  now  Interim History / Subjective:  -No significant events noted overnight -Afebrile, weaned off Levophed earlier this morning, SBP 120's currently  -Palliative met with family yesterday, changed to DNR/DNI.  Pt and spouse considering more of comfort approach and hospice involvement, but want to continue current interventions over weekend and see how things go.  Ok with short term dialysis, but does not wish to pursue long term HD -Evaluated by Podiatry,  ultimately needs MRI Left foot, but awaiting further GOC discussion with Palliative Care  Objective   Blood pressure (!) 131/55, pulse 63, temperature 98.8 F (37.1 C), temperature source Oral, resp. rate 13, height '5\' 4"'  (1.626 m), weight 133.8 kg, SpO2 95 %.        Intake/Output Summary (Last 24 hours) at 01/26/2022 0814 Last data filed at 01/25/2022 1945 Gross per 24 hour  Intake 900.28 ml  Output 1500 ml  Net -599.72 ml    Filed Weights   01/24/22 1751 01/25/22 0930 01/25/22 1307  Weight: 135.9 kg 135.1 kg 133.8 kg    Examination: General: Acute on chronically ill-appearing female, sitting in bed, on room air, no acute distress HENT: Atraumatic, normocephalic, neck supple, difficult to assess JVD due to body habitus Lungs: Coarse breath sounds bilaterally, even, nonlabored Cardiovascular: Bradycardia, regular rhythm, S1-S2, no murmurs, rubs, gallops Abdomen: Obese, soft, nontender, nondistended, no guarding rebound tenderness, bowel sounds positive x4 Extremities: Cellulitis of left lower extremity, and infected appearing left foot wound (see images below) Neuro: Awake, alert and oriented x3, moves all extremities to command, no focal deficits GU: Foley catheter in place       Resolved Hospital Problem list     Assessment & Plan:   Septic shock Bradycardia, pt currently not symptomatic (? R/t hyperkalemia vs Midodrine) Acute decompensated HFpEF ?  Adrenal insufficiency ~ low suspicion as cortisol  normal Echocardiogram 01/23/2022: LVEF 50 to 19%, normal diastolic parameters, normal RV systolic function. -Continuous cardiac monitoring -Maintain MAP >65 -Vasopressors as needed to maintain MAP goal ~ currently weaned off -Continue midodrine -Lactic acid normal -HS Troponin negative -TSH and cortisol normal -Hemodialysis for volume removal -Cardiology following, appreciate input  Severe sepsis in the setting of Pseudomonas UTI and suspected infected left foot ulceration/cellulitis -Monitor fever curve -Trend WBC's & Procalcitonin -Follow cultures as above -Continue empiric cefepime, Flagyl, vancomycin pending cultures & sensitivities -Podiatry and wound care consulted ~appreciate input ~ Podiatry recommends MRI to determine if further/residual/recurrent infection.  If were found to have osteomyelitis, then would likely need major limb amputation ~ awaiting further [place] conversations with Palliative Care as they seem to be considering more of COMFORT approach -Foley catheter was exchanged 9/28  AKI superimposed on CKD stage V Hyperkalemia ~ RESOLVED Hyponatremia ~ IMPROVING -Monitor I&O's / urinary output -Follow BMP -Ensure adequate renal perfusion -Avoid nephrotoxic agents as able -Replace electrolytes as indicated -Nephrology following, appreciate input -Hemodialysis as per nephrology  Chronic MS - patient is at times non verbal and is fully dependent on family for all ADLs  - continue home- [employer]   Best Practice (right click and "Reselect all SmartList Selections" daily)   Diet/type: Regular consistency (see orders) DVT prophylaxis: prophylactic  heparin  GI prophylaxis: N/A Lines: Dialysis Catheter and yes and it is still needed Foley:  Yes, and it is still needed Code Status:  DNR/DNI Last date of multidisciplinary goals of care discussion [01/25/2022]  Will update pt's husband when he arrives at bedside.  Labs   CBC: Recent Labs  Lab 01/23/22 0937  01/24/22 0533 01/25/22 0534 01/26/22 0455  WBC 8.2 11.4* 10.5 7.5  NEUTROABS 7.1  --   --   --   HGB 8.4* 8.5* 8.4* 7.6*  HCT 26.5* 26.6* 25.9* 23.4*  MCV 84.4 83.1 82.7 83.0  PLT 243 306 302 226     Basic Metabolic Panel: Recent Labs  Lab 01/23/22 0937 01/23/22 1038 01/23/22 2255 01/24/22 0533 01/25/22 0534 01/26/22 0455  NA 122*  --  125* 127* 129* 133*  K 6.0*  --  5.1 5.4* 4.6 3.4*  CL 92*  --  93* 95* 92* 99  CO2 16*  --  20* 20* 21* 27  GLUCOSE 82  --  120* 125* 105* 91  BUN 84*  --  65* 70* 57* 39*  CREATININE 7.07*  --  5.48* 5.97* 4.53* 2.54*  CALCIUM 8.3*  --  8.0* 8.3* 8.7* 8.4*  MG  --  2.5* 2.2 2.3  --   --   PHOS  --   --   --  7.4*  --   --     GFR: Estimated Creatinine Clearance: 27.7 mL/min (A) (by C-G formula based on SCr of 2.54 mg/dL (H)). Recent Labs  Lab 01/23/22 0937 01/24/22 0533 01/25/22 0534 01/26/22 0455  PROCALCITON 0.28 0.22 0.39  --   WBC 8.2 11.4* 10.5 7.5  LATICACIDVEN 0.7  --   --   --      Liver Function Tests: Recent Labs  Lab 01/23/22 0937  AST 21  ALT 22  ALKPHOS 64  BILITOT 0.8  PROT 6.5  ALBUMIN 3.3*    No results for input(s): "LIPASE", "AMYLASE" in the last 168 hours. No results for input(s): "AMMONIA" in the last 168 hours.  ABG    Component Value Date/Time   HCO3 28.5 (H) 02/06/2017 1714     Coagulation Profile: Recent Labs  Lab 01/23/22 0937  INR 1.3*     Cardiac Enzymes: No results for input(s): "CKTOTAL", "CKMB", "CKMBINDEX", "TROPONINI" in the last 168 hours.  HbA1C: Hgb A1c MFr Bld  Date/Time Value Ref Range Status  01/26/2020 03:03 PM 5.4 4.8 - 5.6 % Final    Comment:    (NOTE) Pre diabetes:          5.7%-6.4%  Diabetes:              >6.4%  Glycemic control for   <7.0% adults with diabetes   06/20/2019 06:27 PM 5.0 4.8 - 5.6 % Final    Comment:    (NOTE) Pre diabetes:          5.7%-6.4% Diabetes:              >6.4% Glycemic control for   <7.0% adults with diabetes      CBG: Recent Labs  Lab 01/23/22 1638  GLUCAP 102*     Review of Systems:   Positives in BOLD: Gen: Denies fever, chills, weight change, fatigue, night sweats HEENT: Denies blurred vision, double vision, hearing loss, tinnitus, sinus congestion, rhinorrhea, sore throat, neck stiffness, dysphagia PULM: Denies shortness of breath, cough, sputum production, hemoptysis, wheezing CV: Denies chest pain, edema, orthopnea, paroxysmal nocturnal dyspnea, palpitations GI:  Denies abdominal pain, nausea, vomiting, diarrhea, hematochezia, melena, constipation, change in bowel habits GU: Denies dysuria, hematuria, polyuria, oliguria, urethral discharge Endocrine: Denies hot or cold intolerance, polyuria, polyphagia or appetite change Derm: Denies rash, dry skin, scaling or peeling skin change Heme: Denies easy bruising, bleeding, bleeding gums Neuro: Denies headache, numbness, weakness, slurred speech, loss of memory or consciousness   Past Medical History:  She,  has a past medical history of Abdominal aortic atherosclerosis ([place]) (11/11/2016), ADHD, Anxiety, COPD (chronic obstructive pulmonary disease) ([place]), Depression, Dyspnea, Edema, Follicular lymphoma ([place]), Follicular lymphoma grade II ([place]), Hypertension, Hypotension, Kyphoscoliosis and scoliosis (11/26/2011), Morbid obesity ([place]) (01/05/2016), Multiple sclerosis ([place]), Multiple sclerosis ([place]), Neuromuscular disorder ([place]), Obstructive and reflux uropathy, Pain, Peripheral vascular disease of lower extremity with ulceration ([place]) (11/08/2015), Skin ulcer ([place]) (11/08/2015), and Weakness.   Surgical History:   Past Surgical History:  Procedure Laterality Date   BACK SURGERY N/A 2002   BONE BIOPSY Left 12/24/2021   Procedure: BONE BIOPSY-HEEL BONE;  Surgeon: Trula Slade, DPM;  Location: [facility];  Service: Podiatry;  Laterality: Left;   CYST EXCISION     lower back   INGUINAL LYMPH NODE BIOPSY Left 07/04/2016   Procedure: INGUINAL LYMPH  NODE BIOPSY;  Surgeon: Christene Lye, MD;  Location: [facility];  Service: General;  Laterality: Left;   ORIF FEMUR FRACTURE Left 02/04/2020   Procedure: OPEN REDUCTION INTERNAL FIXATION (ORIF) DISTAL FEMUR FRACTURE;  Surgeon: Shona Needles, MD;  Location: [facility];  Service: Orthopedics;  Laterality: Left;   PORTACATH PLACEMENT N/A 07/22/2016   Procedure: INSERTION PORT-A-CATH;  Surgeon: Christene Lye, MD;  Location: [facility];  Service: General;  Laterality: N/A;   TONSILLECTOMY AND ADENOIDECTOMY     TUBAL LIGATION     WOUND DEBRIDEMENT Left 12/24/2021   Procedure: DEBRIDEMENT WOUND-HEEL ULCER;  Surgeon: Trula Slade, DPM;  Location: [facility];  Service: Podiatry;  Laterality: Left;     Social History:   reports that she quit smoking about 5 years ago. Her smoking use included cigarettes. She started smoking about 26 years ago. She has a 20.00 pack-year smoking history. She has never used smokeless tobacco. She reports current drug use. Drug: Marijuana. She reports that she does not drink alcohol.   Family History:  Her family history includes Alcohol abuse in her father; Arthritis in her brother, sister, and sister; CAD in her maternal grandmother; COPD in her mother; Diabetes in her mother; Heart failure in her mother; Kidney disease in her father; Kidney failure in her father; Mental illness in her sister; Stroke in her maternal grandfather.   Allergies No Known Allergies   Home Medications  Prior to Admission medications   Medication Sig Start Date End Date Taking? Authorizing Provider  amLODipine (NORVASC) 5 MG tablet Take 5 mg by mouth daily. 12/09/21  Yes [provider]  atorvastatin (LIPITOR) 10 MG tablet Take 10 mg by mouth at bedtime.  01/14/19  Yes [provider]  budesonide-formoterol (SYMBICORT) 160-4.5 MCG/ACT inhaler Inhale 2 puffs into the lungs 2 (two) times daily.    Yes [provider]  buPROPion (WELLBUTRIN XL) 300 MG 24 hr  tablet Take 300 mg by mouth daily. 03/21/21  Yes [provider]  cephALEXin (KEFLEX) 500 MG capsule Take 1 capsule (500 mg total) by mouth 4 (four) times daily for 10 days. 01/17/22 01/27/22 Yes Bradler, Vista Lawman, MD  clonazePAM (KLONOPIN) 0.5 MG tablet Take 1 tablet (0.5 mg total) by mouth 2 (two) times  daily. 01/12/22  Yes Jennye Boroughs, MD  DULoxetine (CYMBALTA) 60 MG capsule Take 1 capsule (60 mg total) by mouth every morning. 10/14/16  Yes Vaughan Basta, MD  gabapentin (NEURONTIN) 600 MG tablet Take 600 mg by mouth 3 (three) times daily. 12/10/21  Yes [provider]  lisinopril (ZESTRIL) 20 MG tablet Take 1 tablet (20 mg total) by mouth daily. 12/26/21  Yes Jennye Boroughs, MD  Multiple Vitamin (MULTIVITAMIN WITH MINERALS) TABS tablet Take 1 tablet by mouth daily.   Yes [provider]  MYRBETRIQ 50 MG TB24 tablet TAKE ONE TABLET BY MOUTH ONCE DAILY 04/19/20  Yes Stoioff, Ronda Fairly, MD  nystatin powder Apply 1 Application topically 2 (two) times daily.   Yes [provider]  QUEtiapine Fumarate (SEROQUEL XR) 150 MG 24 hr tablet Take 150 mg by mouth at bedtime.    Yes [provider]  sulfamethoxazole-trimethoprim (BACTRIM DS) 800-160 MG tablet Take 1 tablet by mouth 2 (two) times daily for 10 days. 01/17/22 01/27/22 Yes Naaman Plummer, MD  traZODone (DESYREL) 50 MG tablet Take 50 mg by mouth at bedtime. 03/21/21  Yes [provider]  vortioxetine HBr (TRINTELLIX) 5 MG TABS tablet Take 5 mg by mouth daily.   Yes [provider]  acetaminophen (TYLENOL) 500 MG tablet Take 1,000 mg by mouth every 8 (eight) hours as needed for moderate pain.    [provider]  docusate sodium (COLACE) 100 MG capsule Take 1 capsule (100 mg total) by mouth 2 (two) times daily as needed. 11/24/19   Enzo Bi, MD  hydrALAZINE (APRESOLINE) 50 MG tablet Take 50 mg by mouth 3 (three) times daily. Patient not taking: Reported on 01/23/2022 12/12/21    [provider]  magnesium oxide (MAG-OX) 400 MG tablet Take 400 mg by mouth daily. Patient not taking: Reported on 01/23/2022    [provider]  torsemide (DEMADEX) 20 MG tablet Take 1 tablet (20 mg total) by mouth daily as needed (edema). 07/05/21   Lorella Nimrod, MD     Critical care time: 38 minutes     Darel Hong, AGACNP-BC [facility] Prefer epic messenger for cross cover needs If after hours, please call E-link

## 2022-01-26 NOTE — Progress Notes (Signed)
SUBJECTIVE: Patient is feeling much better   Vitals:   01/26/22 0825 01/26/22 0900 01/26/22 1000 01/26/22 1100  BP:  (!) 140/67 (!) 113/97 (!) 147/113  Pulse: 61 63 75 62  Resp: '15 14 12 12  '$ Temp:      TempSrc:      SpO2: 96% 96% 96% 93%  Weight:      Height:        Intake/Output Summary (Last 24 hours) at 01/26/2022 1149 Last data filed at 01/26/2022 1147 Gross per 24 hour  Intake 1184.43 ml  Output 1500 ml  Net -315.57 ml    LABS: Basic Metabolic Panel: Recent Labs    01/23/22 2255 01/24/22 0533 01/25/22 0534 01/26/22 0455  NA 125* 127* 129* 133*  K 5.1 5.4* 4.6 3.4*  CL 93* 95* 92* 99  CO2 20* 20* 21* 27  GLUCOSE 120* 125* 105* 91  BUN 65* 70* 57* 39*  CREATININE 5.48* 5.97* 4.53* 2.54*  CALCIUM 8.0* 8.3* 8.7* 8.4*  MG 2.2 2.3  --   --   PHOS  --  7.4*  --   --    Liver Function Tests: No results for input(s): "AST", "ALT", "ALKPHOS", "BILITOT", "PROT", "ALBUMIN" in the last 72 hours. No results for input(s): "LIPASE", "AMYLASE" in the last 72 hours. CBC: Recent Labs    01/25/22 0534 01/26/22 0455  WBC 10.5 7.5  HGB 8.4* 7.6*  HCT 25.9* 23.4*  MCV 82.7 83.0  PLT 302 226   Cardiac Enzymes: No results for input(s): "CKTOTAL", "CKMB", "CKMBINDEX", "TROPONINI" in the last 72 hours. BNP: Invalid input(s): "POCBNP" D-Dimer: No results for input(s): "DDIMER" in the last 72 hours. Hemoglobin A1C: No results for input(s): "HGBA1C" in the last 72 hours. Fasting Lipid Panel: No results for input(s): "CHOL", "HDL", "LDLCALC", "TRIG", "CHOLHDL", "LDLDIRECT" in the last 72 hours. Thyroid Function Tests: Recent Labs    01/23/22 2255  TSH 2.148   Anemia Panel: No results for input(s): "VITAMINB12", "FOLATE", "FERRITIN", "TIBC", "IRON", "RETICCTPCT" in the last 72 hours.   PHYSICAL EXAM General: Well developed, well nourished, in no acute distress HEENT:  Normocephalic and atramatic Neck:  No JVD.  Lungs: Clear bilaterally to auscultation and  percussion. Heart: HRRR . Normal S1 and S2 without gallops or murmurs.  Abdomen: Bowel sounds are positive, abdomen soft and non-tender  Msk:  Back normal, normal gait. Normal strength and tone for age. Extremities: No clubbing, cyanosis or edema.   Neuro: Alert and oriented X 3. Psych:  Good affect, responds appropriately  TELEMETRY: Sinus rhythm  ASSESSMENT AND PLAN: Status post septic shock with history of atrial fibrillation.  Clinically patient is stable to be discharged cardiac point of view.  Patient can follow-up with Dr. Clayborn Bigness in 1 to 2 weeks.  Principal Problem:   Septic shock ([place])    Neoma Laming A, MD, [facility] 01/26/2022 11:49 AM

## 2022-01-26 NOTE — Progress Notes (Signed)
[facility]  ROUNDING NOTE   Subjective:   Patient resting quietly without any complaints.  Remains on room air.  Patient is weaned off of pressors.  Noted to have some improvement in urine output.    Objective:  Vital signs in last 24 hours:  Temp:  [97.7 F (36.5 C)-99.3 F (37.4 C)] 99.1 F (37.3 C) (09/30 1200) Pulse Rate:  [51-148] 66 (09/30 1200) Resp:  [10-25] 17 (09/30 1200) BP: (101-147)/(40-113) 139/55 (09/30 1200) SpO2:  [91 %-100 %] 93 % (09/30 1200) Weight:  [133.8 kg] 133.8 kg (09/29 1307)  Weight change: -1.3 kg Filed Weights   01/24/22 1751 01/25/22 0930 01/25/22 1307  Weight: 135.9 kg 135.1 kg 133.8 kg    Intake/Output: I/O last 3 completed shifts: In: 950.3 [P.O.:50; I.V.:374.9; IV Piggyback:525.4] Out: 1696 [Urine:515; Other:1300]   Intake/Output this shift:  Total I/O In: 284.2 [I.V.:84.2; IV Piggyback:200] Out: -   Physical Exam: General: NAD  Head: Normocephalic, atraumatic. Moist oral mucosal membranes  Eyes: Anicteric  Lungs:  Diminished in bases, normal effort  Heart: Regular rate and rhythm  Abdomen:  Soft, nontender, obese  Extremities:  trace peripheral edema.  Neurologic: Nonfocal, moving all four extremities  Skin: No lesions  Access: Lt IJ temp cath    Basic Metabolic Panel: Recent Labs  Lab 01/23/22 0937 01/23/22 1038 01/23/22 2255 01/24/22 0533 01/25/22 0534 01/26/22 0455  NA 122*  --  125* 127* 129* 133*  K 6.0*  --  5.1 5.4* 4.6 3.4*  CL 92*  --  93* 95* 92* 99  CO2 16*  --  20* 20* 21* 27  GLUCOSE 82  --  120* 125* 105* 91  BUN 84*  --  65* 70* 57* 39*  CREATININE 7.07*  --  5.48* 5.97* 4.53* 2.54*  CALCIUM 8.3*  --  8.0* 8.3* 8.7* 8.4*  MG  --  2.5* 2.2 2.3  --   --   PHOS  --   --   --  7.4*  --   --      Liver Function Tests: Recent Labs  Lab 01/23/22 0937  AST 21  ALT 22  ALKPHOS 64  BILITOT 0.8  PROT 6.5  ALBUMIN 3.3*    No results for input(s): "LIPASE", "AMYLASE" in the last  168 hours. No results for input(s): "AMMONIA" in the last 168 hours.  CBC: Recent Labs  Lab 01/23/22 0937 01/24/22 0533 01/25/22 0534 01/26/22 0455  WBC 8.2 11.4* 10.5 7.5  NEUTROABS 7.1  --   --   --   HGB 8.4* 8.5* 8.4* 7.6*  HCT 26.5* 26.6* 25.9* 23.4*  MCV 84.4 83.1 82.7 83.0  PLT 243 306 302 226     Cardiac Enzymes: No results for input(s): "CKTOTAL", "CKMB", "CKMBINDEX", "TROPONINI" in the last 168 hours.  BNP: Invalid input(s): "POCBNP"  CBG: Recent Labs  Lab 01/23/22 1638  [place]*     Microbiology: Results for orders placed or performed during the hospital encounter of 01/23/22  Blood Culture (routine x 2)     Status: None (Preliminary result)   Collection Time: 01/23/22  9:37 AM   Specimen: BLOOD  Result Value Ref Range Status   Specimen Description BLOOD RIGHT ANTECUBITAL  Final   Special Requests   Final    BOTTLES DRAWN AEROBIC AND ANAEROBIC Blood Culture adequate volume   Culture   Final    NO GROWTH 3 DAYS Performed at [facility], [address], [place], [place] [address]  Report Status PENDING  Incomplete  Blood Culture (routine x 2)     Status: None (Preliminary result)   Collection Time: 01/23/22  9:37 AM   Specimen: BLOOD  Result Value Ref Range Status   Specimen Description BLOOD LEFT ANTECUBITAL  Final   Special Requests   Final    BOTTLES DRAWN AEROBIC AND ANAEROBIC Blood Culture results may not be optimal due to an inadequate volume of blood received in culture bottles   Culture   Final    NO GROWTH 3 DAYS Performed at [facility], [address], [place], [place] [address]    Report Status PENDING  Incomplete  Urine Culture     Status: Abnormal   Collection Time: 01/23/22  9:37 AM   Specimen: In/Out Cath Urine  Result Value Ref Range Status   Specimen Description   Final    IN/OUT CATH URINE Performed at [facility], [address] [place], [place] [address]    Special Requests   Final     NONE Performed at [facility], [address] [place], [place] [address]    Culture >=100,000 COLONIES/mL PSEUDOMONAS AERUGINOSA (A)  Final   Report Status 01/25/2022 FINAL  Final   Organism ID, Bacteria PSEUDOMONAS AERUGINOSA (A)  Final      Susceptibility   Pseudomonas aeruginosa - MIC*    CEFTAZIDIME 2 SENSITIVE Sensitive     CIPROFLOXACIN <=0.25 SENSITIVE Sensitive     GENTAMICIN <=1 SENSITIVE Sensitive     IMIPENEM 2 SENSITIVE Sensitive     PIP/TAZO <=4 SENSITIVE Sensitive     CEFEPIME 4 SENSITIVE Sensitive     * >=100,000 COLONIES/mL PSEUDOMONAS AERUGINOSA  MRSA Next Gen by PCR, Nasal     Status: None   Collection Time: 01/23/22  4:54 PM   Specimen: Nasal Mucosa; Nasal Swab  Result Value Ref Range Status   MRSA by PCR Next Gen NOT DETECTED NOT DETECTED Final    Comment: (NOTE) The GeneXpert MRSA Assay (FDA approved for NASAL specimens only), is one component of a comprehensive MRSA colonization surveillance program. It is not intended to diagnose MRSA infection nor to guide or monitor treatment for MRSA infections. Test performance is not FDA approved in patients less than 42 years old. Performed at [facility], [address] [place], [place] [address]   Culture, blood (Routine X 2) w Reflex to ID Panel     Status: None (Preliminary result)   Collection Time: 01/23/22  5:02 PM   Specimen: BLOOD RIGHT HAND  Result Value Ref Range Status   Specimen Description BLOOD RIGHT HAND  Final   Special Requests   Final    BOTTLES DRAWN AEROBIC AND ANAEROBIC Blood Culture adequate volume   Culture   Final    NO GROWTH 3 DAYS Performed at [facility], [address], [place], [place] [address]    Report Status PENDING  Incomplete  Aerobic/Anaerobic Culture w Gram Stain (surgical/deep wound)     Status: None (Preliminary result)   Collection Time: 01/24/22  5:04 PM   Specimen: Wound  Result Value Ref Range Status   Specimen Description    Final    WOUND Performed at [facility] [address] [place], [place] [address]    Special Requests   Final    LEFT FOOT Performed at [facility], [address] [place],  [address]    Gram Stain   Final    NO WBC SEEN  FEW GRAM POSITIVE COCCI IN PAIRS FEW GRAM NEGATIVE RODS    Culture   Final    TOO YOUNG TO READ Performed at [facility], [place] [address], [place], [place] [address]    Report Status PENDING  Incomplete    Coagulation Studies: No results for input(s): "LABPROT", "INR" in the last 72 hours.   Urinalysis: No results for input(s): "COLORURINE", "LABSPEC", "PHURINE", "GLUCOSEU", "HGBUR", "BILIRUBINUR", "KETONESUR", "PROTEINUR", "UROBILINOGEN", "NITRITE", "LEUKOCYTESUR" in the last 72 hours.  Invalid input(s): "APPERANCEUR"    Imaging: No results found.   Medications:    anticoagulant sodium citrate     ceFEPime (MAXIPIME) IV Stopped (01/25/22 1752)   metronidazole Stopped (01/26/22 1010)   norepinephrine (LEVOPHED) Adult infusion Stopped (01/26/22 7846)   [START ON 01/28/2022] vancomycin      vitamin C  500 mg Oral BID   buPROPion  300 mg Oral Daily   Chlorhexidine Gluconate Cloth  6 each Topical Q0600   Chlorhexidine Gluconate Cloth  6 each Topical Q0600   clonazePAM  0.5 mg Oral BID   DULoxetine  60 mg Oral BH-q7a   feeding supplement (NEPRO CARB STEADY)  237 mL Oral TID BM   gabapentin  300 mg Oral QHS   heparin  5,000 Units Subcutaneous Q8H   ipratropium-albuterol  3 mL Nebulization BID   midodrine  10 mg Oral TID WC   multivitamin  1 tablet Oral QHS   traZODone  50 mg Oral QHS   vortioxetine HBr  5 mg Oral Daily   zinc sulfate  220 mg Oral Daily   alteplase, anticoagulant sodium citrate, benzonatate, heparin, lidocaine (PF), lidocaine-prilocaine, pentafluoroprop-tetrafluoroeth, polyethylene glycol  Assessment/ Plan:  [name] is a 71 y.o.  female with past medical conditions including COPD,  depression, multiple sclerosis, peripheral vascular disease, hypertension, and ADHD, who was admitted to [facility] on 01/23/2022 for Hyponatremia [E87.1] Septic shock ([place]) [A41.9, R65.21] Acute renal failure, unspecified acute renal failure type ([place]) [N17.9] Cellulitis, unspecified cellulitis site [L03.90] Ulcer of left foot, unspecified ulcer stage ([place]) [L97.529]   Acute kidney injury with hyperkalemia on chronic kidney disease stage IIIa.  Baseline creatinine appears to be 1.27 with GFR 45 on 12/24/2021.  Acute kidney injury likely secondary to cardiorenal syndrome and hypotension.  No IV contrast exposure.  Potassium 6.0 on arrival along with increased creatinine 7.70.  Patient received 3 dialysis treatment sessions since admission.  Holding hemodialysis over the weekend. Noted to have some improvement in urine output this morning.  Latest creatinine level improved to 2.54 . We will continue to monitor renal function and reassess the patient again tomorrow.  Lab Results  Component Value Date   CREATININE 2.54 (H) 01/26/2022   CREATININE 4.53 (H) 01/25/2022   CREATININE 5.97 (H) 01/24/2022    Intake/Output Summary (Last 24 hours) at 01/26/2022 1231 Last data filed at 01/26/2022 1147 Gross per 24 hour  Intake 1184.43 ml  Output 1500 ml  Net -315.57 ml    2.  Acute on chronic diastolic heart failure, decompensated.  ECHO completed on 01/23/22 shows EF 50-55 %. Managing fluid volume with dialysis. Continue to monitor urine output  3. Anemia of chronic kidney disease  Lab Results  Component Value Date   HGB 7.6 (L) 01/26/2022    Latest hemoglobin level 7.6.  Transfuse if hemoglobin less than 7. follow-up H&H  4. Hypotension due to sepsis.  All antihypertensives held.  Currently prescribed midodrine 10 mg 3 times daily.  Currently off of pressors.  LOS: [place] 9/30/202312:31 PM

## 2022-01-26 NOTE — Plan of Care (Signed)
Continuing with plan of care. 

## 2022-01-26 NOTE — Progress Notes (Signed)
Following doctor's visit and conversation regarding Hospice, chaplain provided follow-up. [name] exhibited confusion. She desires to be at home. Reports her husband is at home and her son's live out of the area. She does not want to be in the hospital. Chaplain offered her hospice education to attempt to increase understanding that hospice is not just for last days of life. She may benefit from continued education and nurture. Chaplain called the home to update husband however the number was busy.    01/26/22 1100  Clinical Encounter Type  Visited With Patient  Visit Type Initial

## 2022-01-27 ENCOUNTER — Inpatient Hospital Stay: Payer: Medicare HMO

## 2022-01-27 ENCOUNTER — Encounter: Payer: Self-pay | Admitting: Pulmonary Disease

## 2022-01-27 DIAGNOSIS — A419 Sepsis, unspecified organism: Secondary | ICD-10-CM | POA: Diagnosis not present

## 2022-01-27 DIAGNOSIS — I5033 Acute on chronic diastolic (congestive) heart failure: Secondary | ICD-10-CM | POA: Diagnosis present

## 2022-01-27 DIAGNOSIS — S91302A Unspecified open wound, left foot, initial encounter: Secondary | ICD-10-CM

## 2022-01-27 DIAGNOSIS — M869 Osteomyelitis, unspecified: Secondary | ICD-10-CM | POA: Diagnosis not present

## 2022-01-27 DIAGNOSIS — E875 Hyperkalemia: Secondary | ICD-10-CM | POA: Diagnosis not present

## 2022-01-27 DIAGNOSIS — R6521 Severe sepsis with septic shock: Secondary | ICD-10-CM | POA: Diagnosis not present

## 2022-01-27 DIAGNOSIS — G35 Multiple sclerosis: Secondary | ICD-10-CM

## 2022-01-27 DIAGNOSIS — N179 Acute kidney failure, unspecified: Secondary | ICD-10-CM | POA: Diagnosis present

## 2022-01-27 DIAGNOSIS — N1831 Chronic kidney disease, stage 3a: Secondary | ICD-10-CM

## 2022-01-27 LAB — CBC
HCT: 24.2 % — ABNORMAL LOW (ref 36.0–46.0)
Hemoglobin: 7.7 g/dL — ABNORMAL LOW (ref 12.0–15.0)
MCH: 26.8 pg (ref 26.0–34.0)
MCHC: 31.8 g/dL (ref 30.0–36.0)
MCV: 84.3 fL (ref 80.0–100.0)
Platelets: 237 K/uL (ref 150–400)
RBC: 2.87 MIL/uL — ABNORMAL LOW (ref 3.87–5.11)
RDW: 14.2 % (ref 11.5–15.5)
WBC: 8.7 K/uL (ref 4.0–10.5)
nRBC: 0 % (ref 0.0–0.2)

## 2022-01-27 LAB — RENAL FUNCTION PANEL
Albumin: 3.2 g/dL — ABNORMAL LOW (ref 3.5–5.0)
Anion gap: 8 (ref 5–15)
BUN: 35 mg/dL — ABNORMAL HIGH (ref 8–23)
CO2: 27 mmol/L (ref 22–32)
Calcium: 8.9 mg/dL (ref 8.9–10.3)
Chloride: 101 mmol/L (ref 98–111)
Creatinine, Ser: 1.85 mg/dL — ABNORMAL HIGH (ref 0.44–1.00)
GFR, Estimated: 29 mL/min — ABNORMAL LOW
Glucose, Bld: 83 mg/dL (ref 70–99)
Phosphorus: 3 mg/dL (ref 2.5–4.6)
Potassium: 3.4 mmol/L — ABNORMAL LOW (ref 3.5–5.1)
Sodium: 136 mmol/L (ref 135–145)

## 2022-01-27 LAB — IRON AND TIBC
Iron: 47 ug/dL (ref 28–170)
Saturation Ratios: 22 % (ref 10.4–31.8)
TIBC: 213 ug/dL — ABNORMAL LOW (ref 250–450)
UIBC: 166 ug/dL

## 2022-01-27 LAB — FERRITIN: Ferritin: 155 ng/mL (ref 11–307)

## 2022-01-27 LAB — MAGNESIUM: Magnesium: 1.8 mg/dL (ref 1.7–2.4)

## 2022-01-27 MED ORDER — ACETAMINOPHEN 325 MG PO TABS
650.0000 mg | ORAL_TABLET | Freq: Four times a day (QID) | ORAL | Status: DC | PRN
  Administered 2022-01-27 – 2022-01-29 (×2): 650 mg via ORAL
  Filled 2022-01-27 (×3): qty 2

## 2022-01-27 NOTE — Progress Notes (Signed)
Patient moved to room ICU 17; patient complains of a headache and requested medication, no medication listed to give for pain, Dr. Mal Misty aware.

## 2022-01-27 NOTE — Progress Notes (Addendum)
[facility]  ROUNDING NOTE   Subjective:   Patient is resting comfortably without any complaints.  Remains stable on room air.  Noted to have improvement in renal function and urine output.   Objective:  Vital signs in last 24 hours:  Temp:  [98.1 F (36.7 C)-99.1 F (37.3 C)] 98.1 F (36.7 C) (10/01 0800) Pulse Rate:  [63-80] 67 (10/01 1135) Resp:  [13-21] 13 (10/01 1135) BP: (66-155)/(44-62) 94/59 (10/01 1135) SpO2:  [91 %-100 %] 97 % (10/01 1135)  Weight change:  Filed Weights   01/24/22 1751 01/25/22 0930 01/25/22 1307  Weight: 135.9 kg 135.1 kg 133.8 kg    Intake/Output: I/O last 3 completed shifts: In: 484.2 [I.V.:84.2; IV Piggyback:400] Out: 2150 [Urine:2150]   Intake/Output this shift:  Total I/O In: 65.2 [IV Piggyback:65.2] Out: 250 [Urine:250]  Physical Exam: General: NAD  Head: Normocephalic, atraumatic. Moist oral mucosal membranes  Eyes: Anicteric  Lungs:  Diminished in bases, normal effort  Heart: Regular rate and rhythm  Abdomen:  Soft, nontender, obese  Extremities:  trace peripheral edema.  Neurologic: Nonfocal, moving all four extremities  Skin: No lesions  Access: Lt IJ temp cath    Basic Metabolic Panel: Recent Labs  Lab 01/23/22 1038 01/23/22 2255 01/24/22 0533 01/25/22 0534 01/26/22 0455 01/27/22 0420  NA  --  125* 127* 129* 133* 136  K  --  5.1 5.4* 4.6 3.4* 3.4*  CL  --  93* 95* 92* 99 101  CO2  --  20* 20* 21* 27 27  GLUCOSE  --  120* 125* 105* 91 83  BUN  --  65* 70* 57* 39* 35*  CREATININE  --  5.48* 5.97* 4.53* 2.54* 1.85*  CALCIUM  --  8.0* 8.3* 8.7* 8.4* 8.9  MG 2.5* 2.2 2.3  --   --  1.8  PHOS  --   --  7.4*  --   --  3.0     Liver Function Tests: Recent Labs  Lab 01/23/22 0937 01/27/22 0420  AST 21  --   ALT 22  --   ALKPHOS 64  --   BILITOT 0.8  --   PROT 6.5  --   ALBUMIN 3.3* 3.2*    No results for input(s): "LIPASE", "AMYLASE" in the last 168 hours. No results for input(s): "AMMONIA"  in the last 168 hours.  CBC: Recent Labs  Lab 01/23/22 0937 01/24/22 0533 01/25/22 0534 01/26/22 0455 01/27/22 0420  WBC 8.2 11.4* 10.5 7.5 8.7  NEUTROABS 7.1  --   --   --   --   HGB 8.4* 8.5* 8.4* 7.6* 7.7*  HCT 26.5* 26.6* 25.9* 23.4* 24.2*  MCV 84.4 83.1 82.7 83.0 84.3  PLT 243 306 302 226 237     Cardiac Enzymes: No results for input(s): "CKTOTAL", "CKMB", "CKMBINDEX", "TROPONINI" in the last 168 hours.  BNP: Invalid input(s): "POCBNP"  CBG: Recent Labs  Lab 01/23/22 1638  [place]*     Microbiology: Results for orders placed or performed during the hospital encounter of 01/23/22  Blood Culture (routine x 2)     Status: None (Preliminary result)   Collection Time: 01/23/22  9:37 AM   Specimen: BLOOD  Result Value Ref Range Status   Specimen Description BLOOD RIGHT ANTECUBITAL  Final   Special Requests   Final    BOTTLES DRAWN AEROBIC AND ANAEROBIC Blood Culture adequate volume   Culture   Final    NO GROWTH 4 DAYS Performed at [facility], [place], [place] [address]    Report Status PENDING  Incomplete  Blood Culture (routine x 2)     Status: None (Preliminary result)   Collection Time: 01/23/22  9:37 AM   Specimen: BLOOD  Result Value Ref Range Status   Specimen Description BLOOD LEFT ANTECUBITAL  Final   Special Requests   Final    BOTTLES DRAWN AEROBIC AND ANAEROBIC Blood Culture results may not be optimal due to an inadequate volume of blood received in culture bottles   Culture   Final    NO GROWTH 4 DAYS Performed at [facility], [address], [place], [place] [address]    Report Status PENDING  Incomplete  Urine Culture     Status: Abnormal   Collection Time: 01/23/22  9:37 AM   Specimen: In/Out Cath Urine  Result Value Ref Range Status   Specimen Description   Final    IN/OUT CATH URINE Performed at [facility], [address] [place], [place] [address]    Special Requests   Final     NONE Performed at [facility], [address] [place], [place] [address]    Culture >=100,000 COLONIES/mL PSEUDOMONAS AERUGINOSA (A)  Final   Report Status 01/25/2022 FINAL  Final   Organism ID, Bacteria PSEUDOMONAS AERUGINOSA (A)  Final      Susceptibility   Pseudomonas aeruginosa - MIC*    CEFTAZIDIME 2 SENSITIVE Sensitive     CIPROFLOXACIN <=0.25 SENSITIVE Sensitive     GENTAMICIN <=1 SENSITIVE Sensitive     IMIPENEM 2 SENSITIVE Sensitive     PIP/TAZO <=4 SENSITIVE Sensitive     CEFEPIME 4 SENSITIVE Sensitive     * >=100,000 COLONIES/mL PSEUDOMONAS AERUGINOSA  MRSA Next Gen by PCR, Nasal     Status: None   Collection Time: 01/23/22  4:54 PM   Specimen: Nasal Mucosa; Nasal Swab  Result Value Ref Range Status   MRSA by PCR Next Gen NOT DETECTED NOT DETECTED Final    Comment: (NOTE) The GeneXpert MRSA Assay (FDA approved for NASAL specimens only), is one component of a comprehensive MRSA colonization surveillance program. It is not intended to diagnose MRSA infection nor to guide or monitor treatment for MRSA infections. Test performance is not FDA approved in patients less than 31 years old. Performed at [facility], [address] [place], [place] [address]   Culture, blood (Routine X 2) w Reflex to ID Panel     Status: None (Preliminary result)   Collection Time: 01/23/22  5:02 PM   Specimen: BLOOD RIGHT HAND  Result Value Ref Range Status   Specimen Description BLOOD RIGHT HAND  Final   Special Requests   Final    BOTTLES DRAWN AEROBIC AND ANAEROBIC Blood Culture adequate volume   Culture   Final    NO GROWTH 4 DAYS Performed at [facility], [address] [place], [place] [address]    Report Status PENDING  Incomplete  Aerobic/Anaerobic Culture w Gram Stain (surgical/deep wound)     Status: None (Preliminary result)   Collection Time: 01/24/22  5:04 PM   Specimen: Wound  Result Value Ref Range Status   Specimen Description   Final     WOUND Performed at [facility], [place] [address], [place], [place] [address]    Special Requests   Final    LEFT FOOT Performed at [facility], [name] [place],  [address]  Gram Stain   Final    NO WBC SEEN FEW GRAM POSITIVE COCCI IN PAIRS FEW GRAM NEGATIVE RODS Performed at [facility], [place] [address], [place], [place] [address]    Culture   Final    CULTURE REINCUBATED FOR BETTER GROWTH NO ANAEROBES ISOLATED; CULTURE IN PROGRESS FOR 5 DAYS    Report Status PENDING  Incomplete    Coagulation Studies: No results for input(s): "LABPROT", "INR" in the last 72 hours.   Urinalysis: No results for input(s): "COLORURINE", "LABSPEC", "PHURINE", "GLUCOSEU", "HGBUR", "BILIRUBINUR", "KETONESUR", "PROTEINUR", "UROBILINOGEN", "NITRITE", "LEUKOCYTESUR" in the last 72 hours.  Invalid input(s): "APPERANCEUR"    Imaging: No results found.   Medications:    anticoagulant sodium citrate     ceFEPime (MAXIPIME) IV 200 mL/hr at 01/27/22 4970   metronidazole 100 mL/hr at 01/27/22 0926   [START ON 01/28/2022] vancomycin      vitamin C  500 mg Oral BID   buPROPion  300 mg Oral Daily   Chlorhexidine Gluconate Cloth  6 each Topical Q0600   Chlorhexidine Gluconate Cloth  6 each Topical Q0600   clonazePAM  0.5 mg Oral BID   DULoxetine  60 mg Oral BH-q7a   feeding supplement (NEPRO CARB STEADY)  237 mL Oral TID BM   gabapentin  300 mg Oral QHS   heparin  5,000 Units Subcutaneous Q8H   ipratropium-albuterol  3 mL Nebulization BID   multivitamin  1 tablet Oral QHS   traZODone  50 mg Oral QHS   vortioxetine HBr  5 mg Oral Daily   zinc sulfate  220 mg Oral Daily   alteplase, anticoagulant sodium citrate, benzonatate, heparin, lidocaine (PF), lidocaine-prilocaine, pentafluoroprop-tetrafluoroeth, polyethylene glycol  Assessment/ Plan:  [name] is a 70 y.o.  female with past medical conditions including COPD, depression, multiple sclerosis,  peripheral vascular disease, hypertension, and ADHD, who was admitted to [facility] on 01/23/2022 for Hyponatremia [E87.1] Septic shock ([place]) [A41.9, R65.21] Acute renal failure, unspecified acute renal failure type ([place]) [N17.9] Cellulitis, unspecified cellulitis site [L03.90] Ulcer of left foot, unspecified ulcer stage ([place]) [L97.529]   Acute kidney injury with hyperkalemia on chronic kidney disease stage IIIa.  Baseline creatinine appears to be 1.27 with GFR 45 on 12/24/2021.  Acute kidney injury likely secondary to cardiorenal syndrome and hypotension.  No IV contrast exposure.  Potassium 6.0 on arrival along with increased creatinine 7.70.   Patient received 3 dialysis treatment sessions since admission.  Holding hemodialysis over the weekend.  Patient appears to have improvement in her urine output with output of 1550 mL over preceding 24 hours.  Her creatinine level is also trending down with creatinine of 1.85 as of today's lab. We will continue to monitor renal function and reassess the patient again tomorrow.  Lab Results  Component Value Date   CREATININE 1.85 (H) 01/27/2022   CREATININE 2.54 (H) 01/26/2022   CREATININE 4.53 (H) 01/25/2022    Intake/Output Summary (Last 24 hours) at 01/27/2022 1301 Last data filed at 01/27/2022 0926 Gross per 24 hour  Intake 265.23 ml  Output 1400 ml  Net -1134.77 ml    2.  Acute on chronic diastolic heart failure, decompensated.  ECHO completed on 01/23/22 shows EF 50-55 %.  Noted to have improved urine output.  Temporarily holding HD. continue to monitor urine output.   3. Anemia of chronic kidney disease  Lab Results  Component Value Date   HGB 7.7 (L) 01/27/2022    Latest hemoglobin level 7.7.  Transfuse if hemoglobin less than 7. follow-up H&H  4. Hypotension due to sepsis.  All antihypertensives held.  Currently prescribed midodrine 10 mg 3 times daily.  S/P pressors.   LOS: [place] 10/1/20231:01 PM  I saw and evaluated the  patient with Janora Norlander, NP.  I personally formulated the plan of care.  I agree with the findings and plan as documented in the note except as noted below.  Creatinine down to 1.85 today.  Urine output also increased to 1.9 L.  Previously was requiring dialysis but it appears that she is experienced enough renal recovery to no longer require any dialysis.  Continue to monitor renal parameters daily for now and avoid nephrotoxins as possible.  Shaindel Sweeten [place] Raring , [facility] 10/1/20236:56 PM

## 2022-01-27 NOTE — Progress Notes (Signed)
At approximately 1430 patient to MRI via hospital bed in stable condition, patient returned at approximately 1530 in stable condition in hospital bed.

## 2022-01-27 NOTE — Progress Notes (Signed)
PODIATRY PROGRESS NOTE  NAME [name] MRN [id number] DOB [birth year] DOA 01/23/2022   Reason for consult:  Chief Complaint  Patient presents with   Possible Sepsis      History of present illness: 71 y.o. female admitted for hypotension concern for sepsis.  Admitted to the ICU originally for acute exacerbation of chronic diastolic CHF and septic shock complicated by AKI on CKD.  She was started on IV antibiotics.  Found to have Pseudomonas UTI suspected infected left foot ulcer and cellulitis.  She been on IV cefepime, vancomycin, Flagyl.  I performed surgery for bone biopsy on December 24, 2021.  She did not follow-up in the office with the wound care center and her today it was due to not being able to get out of the house.  She was also readmitted to the hospital on September 21 for cellulitis of the left lower extremity and chronic osteomyelitis.  MRI performed December 21 showed pulmonary edema posterior calcaneus deep to the wound consistent with chronic osteomyelitis.  Patient developed a new wound to the top of her foot since I saw her last in August.  States that she is feeling better this morning.  Vitals:   01/27/22 1100 01/27/22 1135  BP: (!) 78/44 (!) 94/59  Pulse: 66 67  Resp: 13 13  Temp:    SpO2: 97% 97%       Latest Ref Rng & Units 01/27/2022    4:20 AM 01/26/2022    4:55 AM 01/25/2022    5:34 AM  CBC  WBC 4.0 - 10.5 K/uL 8.7  7.5  10.5   Hemoglobin 12.0 - 15.0 g/dL 7.7  7.6  8.4   Hematocrit 36.0 - 46.0 % 24.2  23.4  25.9   Platelets 150 - 400 K/uL 237  226  302        Latest Ref Rng & Units 01/27/2022    4:20 AM 01/26/2022    4:55 AM 01/25/2022    5:34 AM  BMP  Glucose 70 - 99 mg/dL 83  91  105   BUN 8 - 23 mg/dL 35  39  57   Creatinine 0.44 - 1.00 mg/dL 1.85  2.54  4.53   Sodium 135 - 145 mmol/L 136  133  129   Potassium 3.5 - 5.1 mmol/L 3.4  3.4  4.6   Chloride 98 - 111 mmol/L 101  99  92   CO2 22 - 32 mmol/L '27  27  21   '$ Calcium 8.9 - 10.3 mg/dL 8.9   8.4  8.7       Physical Exam: General: AAOx3, NAD  Dermatology: Large full-thickness ulceration noted on the posterior aspect heel eschar is present.  There are still sutures intact from prior surgery.  There is purulence coming from the wound also wound on the dorsal aspect of the foot without any drainage or pus coming from this area.  There is erythema extending above the ankle to the foot.  No openings on the right foot there is dry skin.  Vascular: DP pulse palpable  Neurological: Sensation decreased  Musculoskeletal Exam: No significant pain on exam    ASSESSMENT/PLAN OF CARE  I am very concerned with the looks of this ulcer as it has significantly worsened since I last saw her and she is at very high risk of proximal amputation.  She said that she does not want to proceed with amputation at this time.  If were to try to save this  foot would recommend repeat MRI as well as arterial studies.  I will order both of these studies.  For now continue broad-spectrum antibiotics.  Offloading to the heel.  Podiatry will continue to follow.    Please contact me directly with any questions or concerns.     Celesta Gentile, DPM [facility]  Dr. Bonna Gains. [address], [address], [place] [address]                Office [phone]  Fax [phone]

## 2022-01-27 NOTE — Progress Notes (Signed)
[facility] Cardiology    SUBJECTIVE: Patient states he feels reasonably well less pain no shortness of breath no fever chills or sweats resting comfortably in bed   Vitals:   01/27/22 1300 01/27/22 1400 01/27/22 1500 01/27/22 1600  BP: 105/61 (!) 173/154    Pulse: 65 69 66 67  Resp: '15 13 17 13  '$ Temp:    (!) 97.1 F (36.2 C)  TempSrc:    Oral  SpO2: 97% 97% 99% 100%  Weight:      Height:         Intake/Output Summary (Last 24 hours) at 01/27/2022 1730 Last data filed at 01/27/2022 1608 Gross per 24 hour  Intake 300.04 ml  Output 1300 ml  Net -999.96 ml      PHYSICAL EXAM  General: Well developed, well nourished, in no acute distress HEENT:  Normocephalic and atramatic Neck:  No JVD.  Lungs: Clear bilaterally to auscultation and percussion. Heart: Bradycardia. Normal S1 and S2 without gallops or murmurs.  Abdomen: Bowel sounds are positive, abdomen soft and non-tender  Msk:  Back normal, normal gait. Normal strength and tone for age. Extremities: No clubbing, cyanosis or edema.   Neuro: Alert and oriented X 3. Psych:  Good affect, responds appropriately   LABS: Basic Metabolic Panel: Recent Labs    01/26/22 0455 01/27/22 0420  NA 133* 136  K 3.4* 3.4*  CL 99 101  CO2 27 27  GLUCOSE 91 83  BUN 39* 35*  CREATININE 2.54* 1.85*  CALCIUM 8.4* 8.9  MG  --  1.8  PHOS  --  3.0   Liver Function Tests: Recent Labs    01/27/22 0420  ALBUMIN 3.2*   No results for input(s): "LIPASE", "AMYLASE" in the last 72 hours. CBC: Recent Labs    01/26/22 0455 01/27/22 0420  WBC 7.5 8.7  HGB 7.6* 7.7*  HCT 23.4* 24.2*  MCV 83.0 84.3  PLT 226 237   Cardiac Enzymes: No results for input(s): "CKTOTAL", "CKMB", "CKMBINDEX", "TROPONINI" in the last 72 hours. BNP: Invalid input(s): "POCBNP" D-Dimer: No results for input(s): "DDIMER" in the last 72 hours. Hemoglobin A1C: No results for input(s): "HGBA1C" in the last 72 hours. Fasting Lipid Panel: No results for input(s):  "CHOL", "HDL", "LDLCALC", "TRIG", "CHOLHDL", "LDLDIRECT" in the last 72 hours. Thyroid Function Tests: No results for input(s): "TSH", "T4TOTAL", "T3FREE", "THYROIDAB" in the last 72 hours.  Invalid input(s): "FREET3" Anemia Panel: Recent Labs    01/27/22 0420  FERRITIN 155  TIBC 213*  IRON 47    MR ANKLE LEFT WO CONTRAST  Result Date: 01/27/2022 CLINICAL DATA:  Progressive left anterior ankle ulcer and heel ulcer. Preop imaging for possible amputation. Evaluate for osteomyelitis. EXAM: MRI OF THE LEFT ANKLE WITHOUT CONTRAST TECHNIQUE: Multiplanar, multisequence MR imaging of the ankle was performed. No intravenous contrast was administered. COMPARISON:  Left foot radiographs 01/23/2022 and 01/17/2022; FINDINGS: TENDONS Peroneal: The peroneus longus and brevis tendons are intact. Posteromedial: Intact tibialis posterior, flexor digitorum longus, and flexor hallucis longus tendons. Anterior: The tibialis anterior, extensor hallucis longus, and extensor digitorum longus tendons are intact. Achilles: Intact. Plantar Fascia: Mild-to-moderate plantar calcaneal heel spur. No plantar fasciitis. High-grade edema/fluid within the flexor digitorum brevis musculature within the visualized plantar midfoot. LIGAMENTS Lateral: The anterior and posterior talofibular, anterior and posterior tibiofibular, and calcaneofibular ligaments are intact. Medial: The tibiotalar deep deltoid and tibial spring ligaments are intact. CARTILAGE Ankle Joint: Mild thinning of the tibiotalar cartilage, greatest at the far medial aspect of the  talar dome. Subtalar Joints/Sinus Tarsi: Fat is preserved within sinus tarsi. Bones: Mild cartilage thinning of the talonavicular, navicular-cuneiform, and tarsometatarsal joints. There is again a soft tissue defect within the posterior heel just distal to the Achilles tendon insertion, contacting bone. There is again moderate marrow edema throughout the posterolateral and posterior aspect of  the calcaneus, very similar to prior. This is again consistent with chronic osteomyelitis. Other: There is high-grade edema within the abductor hallucis and flexor digitorum brevis muscles with mild fluid around the peripheral fascial planes. There is complete fatty infiltration as of the abductor digiti minimi muscle as seen on prior 01/17/2022 axial T1 weighted sequence. There is again moderate swelling and fluid bright edema within the visualized dorsal midfoot to forefoot subcutaneous fat at the level of the metatarsals. IMPRESSION: 1. There is again a soft tissue defect within the posterior heel just distal to the Achilles tendon insertion, contacting bone. There is again moderate marrow edema throughout the posterolateral and posterior aspect of the calcaneus, consistent with chronic osteomyelitis. 2. High-grade edema within the abductor hallucis and flexor digitorum brevis muscles with mild fluid around the peripheral fascial planes. This is compatible with diabetic myopathy/myositis. 3. Moderate swelling and fluid bright edema within the visualized dorsal midfoot to forefoot subcutaneous fat at the level of the metatarsals. This is suggestive of cellulitis. Electronically Signed   By: Yvonne Kendall M.D.   On: 01/27/2022 16:24     Echo preserved left ventricular function 50 to 55%  TELEMETRY: Telemetry independently reviewed and read by me suggested atrial fibrillation versus junctional rhythm with extreme bradycardia rate of around 49 nonspecific ST-T wave changes:  ASSESSMENT AND PLAN:  Principal Problem:   Septic shock (HCC) Active Problems:   Multiple sclerosis (HCC)   Obesity, Class III, BMI 40-49.9 (morbid obesity) (HCC)   Cystitis due to Pseudomonas   Chronic kidney disease (CKD), stage III (moderate) (HCC)   Hypokalemia   COPD (chronic obstructive pulmonary disease) (HCC)   Non healing left heel wound   AKI (acute kidney injury) (HCC)   Acute on chronic diastolic CHF (congestive  heart failure) (HCC)   Hyperkalemia    Acute on chronic congestive heart failure Sepsis secondary to cellulitis COPD Acute renal insufficiency Obesity Obstructive sleep apnea Peripheral vascular disease Multiple sclerosis . Plan Continue medical therapy with broad-spectrum antibiotic therapy as well as local wound care Continue current management of chronic congestive heart failure symptoms which appear to be compensated Nephrology input for renal insufficiency maintain adequate hydration Continue inhalers for COPD shortness of breath symptoms Continue to correct electrolytes Management of multiple sclerosis as per neurology I do not recommend any invasive cardiac procedures at this stage   Yolonda Kida, MD 01/27/2022 5:30 PM

## 2022-01-27 NOTE — Plan of Care (Signed)
Continuing with plan of care. 

## 2022-01-27 NOTE — Progress Notes (Signed)
SUBJECTIVE: Feeling much better.  With no chest pain or shortness of breath.   Vitals:   01/27/22 0900 01/27/22 1000 01/27/22 1100 01/27/22 1135  BP: 139/60 (!) 145/61 (!) 78/44 (!) 94/59  Pulse: 72 80 66 67  Resp: '13 13 13 13  '$ Temp:      TempSrc:      SpO2: 95% 92% 97% 97%  Weight:      Height:        Intake/Output Summary (Last 24 hours) at 01/27/2022 1225 Last data filed at 01/27/2022 0926 Gross per 24 hour  Intake 265.23 ml  Output 1400 ml  Net -1134.77 ml    LABS: Basic Metabolic Panel: Recent Labs    01/26/22 0455 01/27/22 0420  NA 133* 136  K 3.4* 3.4*  CL 99 101  CO2 27 27  GLUCOSE 91 83  BUN 39* 35*  CREATININE 2.54* 1.85*  CALCIUM 8.4* 8.9  MG  --  1.8  PHOS  --  3.0   Liver Function Tests: Recent Labs    01/27/22 0420  ALBUMIN 3.2*   No results for input(s): "LIPASE", "AMYLASE" in the last 72 hours. CBC: Recent Labs    01/26/22 0455 01/27/22 0420  WBC 7.5 8.7  HGB 7.6* 7.7*  HCT 23.4* 24.2*  MCV 83.0 84.3  PLT 226 237   Cardiac Enzymes: No results for input(s): "CKTOTAL", "CKMB", "CKMBINDEX", "TROPONINI" in the last 72 hours. BNP: Invalid input(s): "POCBNP" D-Dimer: No results for input(s): "DDIMER" in the last 72 hours. Hemoglobin A1C: No results for input(s): "HGBA1C" in the last 72 hours. Fasting Lipid Panel: No results for input(s): "CHOL", "HDL", "LDLCALC", "TRIG", "CHOLHDL", "LDLDIRECT" in the last 72 hours. Thyroid Function Tests: No results for input(s): "TSH", "T4TOTAL", "T3FREE", "THYROIDAB" in the last 72 hours.  Invalid input(s): "FREET3" Anemia Panel: No results for input(s): "VITAMINB12", "FOLATE", "FERRITIN", "TIBC", "IRON", "RETICCTPCT" in the last 72 hours.   PHYSICAL EXAM General: Well developed, well nourished, in no acute distress HEENT:  Normocephalic and atramatic Neck:  No JVD.  Lungs: Clear bilaterally to auscultation and percussion. Heart: HRRR . Normal S1 and S2 without gallops or murmurs.  Abdomen:  Bowel sounds are positive, abdomen soft and non-tender  Msk:  Back normal, normal gait. Normal strength and tone for age. Extremities: No clubbing, cyanosis or edema.   Neuro: Alert and oriented X 3. Psych:  Good affect, responds appropriately  TELEMETRY: Normal sinus rhythm  ASSESSMENT AND PLAN: Congestive heart failure just status post septic shock.  Patient is back in compensated heart failure.  Feeling much better.  Cardiac point of view can be moved to the floor.  Principal Problem:   Septic shock ([place]) Active Problems:   Multiple sclerosis (HCC)   Obesity, Class III, BMI 40-49.9 (morbid obesity) (HCC)   Cystitis due to Pseudomonas   Chronic kidney disease (CKD), stage III (moderate) (HCC)   Hypokalemia   COPD (chronic obstructive pulmonary disease) (HCC)   Non healing left heel wound   AKI (acute kidney injury) ([place])   Acute on chronic diastolic CHF (congestive heart failure) ([place])   Hyperkalemia    Candus Braud A, MD, [facility] 01/27/2022 12:25 PM

## 2022-01-27 NOTE — Progress Notes (Addendum)
Progress Note    [name]  ZDG:[id number] DOB: 02-Jul-1950  DOA: 01/23/2022 PCP: Kirk Ruths, MD      Brief Narrative:    Medical records reviewed and are as summarized below:  [name] is a 71 y.o. female  with medical history significant for multiple sclerosis,  neurogenic bladder, CKD stage IIIa, subdural hematoma s/p craniotomy in July 2023, peripheral vascular disease, morbid obesity, OSA, hyperlipidemia, COPD, follicular intra-abdominal lymphoma, major depressive disorder, recent hospitalization from 12/22/2021 through 12/26/2021 for management of nonhealing left ulcer and cellulitis of the left lower leg (bone biopsy was negative for osteomyelitis) and discharged on 2-week course of Augmentin.  She was hospitalized again from 9/13 through 01/12/2022 because of pain in the left foot, bloody drainage from the left heel ulcer.  She was seen by the podiatrist on visit.  She was treated with IV Unasyn for 48 hours and was discharged to SNF for local wound care.  She presented to the hospital again on 01/23/2022 because of hypotension and concern for possible sepsis.  She was admitted to the ICU for acute exacerbation of chronic diastolic CHF (BNP 5,956) and septic shock complicated by AKI on CKD stage IIIa with hyperkalemia.  She was treated with empiric IV antibiotics, IV hydrocortisone and vasopressors.  She has been weaned off Levophed.  She was transferred to the hospitalist service on 01/27/2022.       Assessment/Plan:   Principal Problem:   Septic shock (HCC) Active Problems:   Cystitis due to Pseudomonas   AKI (acute kidney injury) ([place])   Acute on chronic diastolic CHF (congestive heart failure) (HCC)   Non healing left heel wound   Chronic kidney disease (CKD), stage III (moderate) (HCC)   Multiple sclerosis (HCC)   Obesity, Class III, BMI 40-49.9 (morbid obesity) (HCC)   Hypokalemia   COPD (chronic obstructive pulmonary disease) (HCC)    Hyperkalemia   Body mass index is 50.63 kg/m.  (Morbid obesity)    Septic shock secondary to Pseudomonas UTI and suspected infected left foot ulcer with cellulitis: Continue IV cefepime, IV vancomycin and IV Flagyl.  She has been weaned off vasopressors.  Discontinue midodrine because of elevated blood pressure.  AKI on CKD stage IIIa: She required hemodialysis support for AKI.  Creatinine is improving.  Follow-up with nephrologist.  Acute on chronic diastolic CHF, paroxysmal atrial fibrillation: Compensated.  Follow-up with cardiologist.  Hypokalemia: Defer treatment to nephrologist  Anemia of chronic disease, normocytic anemia: Check iron and B12 levels  Left heel decubitus ulcer, left anterior ankle ulcer: Follow-up with podiatrist  Multiple sclerosis with neurogenic bladder: Continue indwelling Foley catheter.  Hyperkalemia and hyponatremia: Resolved   Other comorbidities include COPD, OSA  Diet Order             Diet renal with fluid restriction Fluid restriction: 1200 mL Fluid; Room service appropriate? Yes; Fluid consistency: Thin  Diet effective now                            Consultants: Intensivist Cardiologist Nephrologist Podiatrist Palliative care  Procedures: Left IJ central line on 01/23/2022    Medications:    vitamin C  500 mg Oral BID   buPROPion  300 mg Oral Daily   Chlorhexidine Gluconate Cloth  6 each Topical Q0600   Chlorhexidine Gluconate Cloth  6 each Topical Q0600   clonazePAM  0.5 mg Oral BID   DULoxetine  60 mg Oral BH-q7a   feeding supplement (NEPRO CARB STEADY)  237 mL Oral TID BM   gabapentin  300 mg Oral QHS   heparin  5,000 Units Subcutaneous Q8H   ipratropium-albuterol  3 mL Nebulization BID   multivitamin  1 tablet Oral QHS   traZODone  50 mg Oral QHS   vortioxetine HBr  5 mg Oral Daily   zinc sulfate  220 mg Oral Daily   Continuous Infusions:  anticoagulant sodium citrate     ceFEPime (MAXIPIME) IV 200  mL/hr at 01/27/22 0926   metronidazole 100 mL/hr at 01/27/22 0926   [START ON 01/28/2022] vancomycin       Anti-infectives (From admission, onward)    Start     Dose/Rate Route Frequency Ordered Stop   01/28/22 1200  vancomycin (VANCOCIN) IVPB 1000 mg/200 mL premix        1,000 mg 200 mL/hr over 60 Minutes Intravenous Every M-W-F (Hemodialysis) 01/25/22 1459     01/25/22 1000  vancomycin (VANCOCIN) IVPB 1000 mg/200 mL premix  Status:  Discontinued        1,000 mg 200 mL/hr over 60 Minutes Intravenous  Once 01/25/22 0807 01/25/22 1233   01/24/22 1800  ceFEPIme (MAXIPIME) 1 g in sodium chloride 0.9 % 100 mL IVPB        1 g 200 mL/hr over 30 Minutes Intravenous Every 24 hours 01/24/22 1116     01/24/22 1114  vancomycin (VANCOCIN) IVPB 1000 mg/200 mL premix  Status:  Discontinued        1,000 mg 200 mL/hr over 60 Minutes Intravenous Every Dialysis 01/24/22 1115 01/25/22 1459   01/24/22 1000  metroNIDAZOLE (FLAGYL) IVPB 500 mg        500 mg 100 mL/hr over 60 Minutes Intravenous Every 12 hours 01/24/22 0830     01/23/22 1015  vancomycin (VANCOREADY) IVPB 2000 mg/400 mL        2,000 mg 200 mL/hr over 120 Minutes Intravenous  Once 01/23/22 1003 01/23/22 1345   01/23/22 1015  piperacillin-tazobactam (ZOSYN) IVPB 3.375 g        3.375 g 100 mL/hr over 30 Minutes Intravenous  Once 01/23/22 1003 01/23/22 1050              Family Communication/Anticipated D/C date and plan/Code Status   DVT prophylaxis: heparin injection 5,000 Units Start: 01/23/22 2200     Code Status: DNR  Family Communication: None Disposition Plan: Plan to discharge to SNF in 3 to 5 days   Status is: Inpatient Remains inpatient appropriate because: On IV antibiotics       Subjective:   Interval events noted.  She feels a little better today.  No abdominal pain  Objective:    Vitals:   01/27/22 0748 01/27/22 0800 01/27/22 0900 01/27/22 1000  BP:  (!) 145/60 139/60 (!) 145/61  Pulse:  66 72 80   Resp:  '14 13 13  '$ Temp:  98.1 F (36.7 C)    TempSrc:  Oral    SpO2: 96% 95% 95% 92%  Weight:      Height:       No data found.   Intake/Output Summary (Last 24 hours) at 01/27/2022 1135 Last data filed at 01/27/2022 0926 Gross per 24 hour  Intake 549.38 ml  Output 1800 ml  Net -1250.62 ml   Filed Weights   01/24/22 1751 01/25/22 0930 01/25/22 1307  Weight: 135.9 kg 135.1 kg 133.8 kg    Exam:  GEN: NAD SKIN: Warm  and dry, left heel ulcer and left ankle/ dorsal foot ulcer EYES: No pallor or icterus ENT: MMM CV: RRR PULM: CTA B ABD: soft, obese, NT, +BS CNS: AAO x 3, non focal EXT: Trace b/l leg edema, left leg and foot redness GU: Foley catheter with amber urine       Data Reviewed:   I have personally reviewed following labs and imaging studies:  Labs: Labs show the following:   Basic Metabolic Panel: Recent Labs  Lab 01/23/22 1038 01/23/22 2255 01/24/22 0533 01/25/22 0534 01/26/22 0455 01/27/22 0420  NA  --  125* 127* 129* 133* 136  K  --  5.1 5.4* 4.6 3.4* 3.4*  CL  --  93* 95* 92* 99 101  CO2  --  20* 20* 21* 27 27  GLUCOSE  --  120* 125* 105* 91 83  BUN  --  65* 70* 57* 39* 35*  CREATININE  --  5.48* 5.97* 4.53* 2.54* 1.85*  CALCIUM  --  8.0* 8.3* 8.7* 8.4* 8.9  MG 2.5* 2.2 2.3  --   --  1.8  PHOS  --   --  7.4*  --   --  3.0   GFR Estimated Creatinine Clearance: 38 mL/min (A) (by C-G formula based on SCr of 1.85 mg/dL (H)). Liver Function Tests: Recent Labs  Lab 01/23/22 0937 01/27/22 0420  AST 21  --   ALT 22  --   ALKPHOS 64  --   BILITOT 0.8  --   PROT 6.5  --   ALBUMIN 3.3* 3.2*   No results for input(s): "LIPASE", "AMYLASE" in the last 168 hours. No results for input(s): "AMMONIA" in the last 168 hours. Coagulation profile Recent Labs  Lab 01/23/22 0937  INR 1.3*    CBC: Recent Labs  Lab 01/23/22 0937 01/24/22 0533 01/25/22 0534 01/26/22 0455 01/27/22 0420  WBC 8.2 11.4* 10.5 7.5 8.7  NEUTROABS 7.1  --   --    --   --   HGB 8.4* 8.5* 8.4* 7.6* 7.7*  HCT 26.5* 26.6* 25.9* 23.4* 24.2*  MCV 84.4 83.1 82.7 83.0 84.3  PLT 243 306 302 226 237   Cardiac Enzymes: No results for input(s): "CKTOTAL", "CKMB", "CKMBINDEX", "TROPONINI" in the last 168 hours. BNP (last 3 results) No results for input(s): "PROBNP" in the last 8760 hours. CBG: Recent Labs  Lab 01/23/22 1638  GLUCAP 102*   D-Dimer: No results for input(s): "DDIMER" in the last 72 hours. Hgb A1c: No results for input(s): "HGBA1C" in the last 72 hours. Lipid Profile: No results for input(s): "CHOL", "HDL", "LDLCALC", "TRIG", "CHOLHDL", "LDLDIRECT" in the last 72 hours. Thyroid function studies: No results for input(s): "TSH", "T4TOTAL", "T3FREE", "THYROIDAB" in the last 72 hours.  Invalid input(s): "FREET3" Anemia work up: No results for input(s): "VITAMINB12", "FOLATE", "FERRITIN", "TIBC", "IRON", "RETICCTPCT" in the last 72 hours. Sepsis Labs: Recent Labs  Lab 01/23/22 0937 01/24/22 0533 01/25/22 0534 01/26/22 0455 01/27/22 0420  PROCALCITON 0.28 0.22 0.39  --   --   WBC 8.2 11.4* 10.5 7.5 8.7  LATICACIDVEN 0.7  --   --   --   --     Microbiology Recent Results (from the past 240 hour(s))  Culture, blood (Routine x 2)     Status: None   Collection Time: 01/17/22  5:20 PM   Specimen: BLOOD  Result Value Ref Range Status   Specimen Description BLOOD RIGHT ANTECUBITAL  Final   Special Requests   Final  BOTTLES DRAWN AEROBIC AND ANAEROBIC Blood Culture adequate volume   Culture   Final    NO GROWTH 5 DAYS Performed at [facility], [address] [place], [place] [address]    Report Status 01/22/2022 FINAL  Final  Culture, blood (Routine x 2)     Status: None   Collection Time: 01/17/22  5:20 PM   Specimen: BLOOD  Result Value Ref Range Status   Specimen Description BLOOD LEFT ANTECUBITAL  Final   Special Requests   Final    BOTTLES DRAWN AEROBIC AND ANAEROBIC Blood Culture adequate volume   Culture    Final    NO GROWTH 5 DAYS Performed at [facility], [address], [place], [place] [address]    Report Status 01/22/2022 FINAL  Final  Resp Panel by RT-PCR (Flu A&B, Covid) Anterior Nasal Swab     Status: None   Collection Time: 01/17/22  5:24 PM   Specimen: Anterior Nasal Swab  Result Value Ref Range Status   SARS Coronavirus 2 by RT PCR NEGATIVE NEGATIVE Final    Comment: (NOTE) SARS-CoV-2 target nucleic acids are NOT DETECTED.  The SARS-CoV-2 RNA is generally detectable in upper respiratory specimens during the acute phase of infection. The lowest concentration of SARS-CoV-2 viral copies this assay can detect is 138 copies/mL. A negative result does not preclude SARS-Cov-2 infection and should not be used as the sole basis for treatment or other patient management decisions. A negative result may occur with  improper specimen collection/handling, submission of specimen other than nasopharyngeal swab, presence of viral mutation(s) within the areas targeted by this assay, and inadequate number of viral copies(<138 copies/mL). A negative result must be combined with clinical observations, patient history, and epidemiological information. The expected result is Negative.  Fact Sheet for Patients:  EntrepreneurPulse.com.au  Fact Sheet for Healthcare Providers:  IncredibleEmployment.be  This test is no t yet approved or cleared by the [place] FDA and  has been authorized for detection and/or diagnosis of SARS-CoV-2 by FDA under an Emergency Use Authorization (EUA). This EUA will remain  in effect (meaning this test can be used) for the duration of the COVID-19 declaration under Section 564(b)(1) of the Act, 21 U.S.C.section 360bbb-3(b)(1), unless the authorization is terminated  or revoked sooner.       Influenza A by PCR NEGATIVE NEGATIVE Final   Influenza B by PCR NEGATIVE NEGATIVE Final    Comment: (NOTE) The Xpert  Xpress SARS-CoV-2/FLU/RSV plus assay is intended as an aid in the diagnosis of influenza from Nasopharyngeal swab specimens and should not be used as a sole basis for treatment. Nasal washings and aspirates are unacceptable for Xpert Xpress SARS-CoV-2/FLU/RSV testing.  Fact Sheet for Patients: EntrepreneurPulse.com.au  Fact Sheet for Healthcare Providers: IncredibleEmployment.be  This test is not yet approved or cleared by the [place] FDA and has been authorized for detection and/or diagnosis of SARS-CoV-2 by FDA under an Emergency Use Authorization (EUA). This EUA will remain in effect (meaning this test can be used) for the duration of the COVID-19 declaration under Section 564(b)(1) of the Act, 21 U.S.C. section 360bbb-3(b)(1), unless the authorization is terminated or revoked.  Performed at [facility], [address] [place], [place] [address]   Blood Culture (routine x 2)     Status: None (Preliminary result)   Collection Time: 01/23/22  9:37 AM   Specimen: BLOOD  Result Value Ref Range Status   Specimen Description BLOOD RIGHT ANTECUBITAL  Final  Special Requests   Final    BOTTLES DRAWN AEROBIC AND ANAEROBIC Blood Culture adequate volume   Culture   Final    NO GROWTH 4 DAYS Performed at [facility], [address] [place], [place] [address]    Report Status PENDING  Incomplete  Blood Culture (routine x 2)     Status: None (Preliminary result)   Collection Time: 01/23/22  9:37 AM   Specimen: BLOOD  Result Value Ref Range Status   Specimen Description BLOOD LEFT ANTECUBITAL  Final   Special Requests   Final    BOTTLES DRAWN AEROBIC AND ANAEROBIC Blood Culture results may not be optimal due to an inadequate volume of blood received in culture bottles   Culture   Final    NO GROWTH 4 DAYS Performed at [facility], [address] [place], [place] [address]    Report Status PENDING   Incomplete  Urine Culture     Status: Abnormal   Collection Time: 01/23/22  9:37 AM   Specimen: In/Out Cath Urine  Result Value Ref Range Status   Specimen Description   Final    IN/OUT CATH URINE Performed at [facility], [address], [place], [place] [address]    Special Requests   Final    NONE Performed at [facility], [address] [place], [place] [address]    Culture >=100,000 COLONIES/mL PSEUDOMONAS AERUGINOSA (A)  Final   Report Status 01/25/2022 FINAL  Final   Organism ID, Bacteria PSEUDOMONAS AERUGINOSA (A)  Final      Susceptibility   Pseudomonas aeruginosa - MIC*    CEFTAZIDIME 2 SENSITIVE Sensitive     CIPROFLOXACIN <=0.25 SENSITIVE Sensitive     GENTAMICIN <=1 SENSITIVE Sensitive     IMIPENEM 2 SENSITIVE Sensitive     PIP/TAZO <=4 SENSITIVE Sensitive     CEFEPIME 4 SENSITIVE Sensitive     * >=100,000 COLONIES/mL PSEUDOMONAS AERUGINOSA  MRSA Next Gen by PCR, Nasal     Status: None   Collection Time: 01/23/22  4:54 PM   Specimen: Nasal Mucosa; Nasal Swab  Result Value Ref Range Status   MRSA by PCR Next Gen NOT DETECTED NOT DETECTED Final    Comment: (NOTE) The GeneXpert MRSA Assay (FDA approved for NASAL specimens only), is one component of a comprehensive MRSA colonization surveillance program. It is not intended to diagnose MRSA infection nor to guide or monitor treatment for MRSA infections. Test performance is not FDA approved in patients less than 58 years old. Performed at [facility], [address] [place], [place] [address]   Culture, blood (Routine X 2) w Reflex to ID Panel     Status: None (Preliminary result)   Collection Time: 01/23/22  5:02 PM   Specimen: BLOOD RIGHT HAND  Result Value Ref Range Status   Specimen Description BLOOD RIGHT HAND  Final   Special Requests   Final    BOTTLES DRAWN AEROBIC AND ANAEROBIC Blood Culture adequate volume   Culture   Final    NO GROWTH 4 DAYS Performed at [facility], [address] [place], [place] [address]    Report Status PENDING  Incomplete  Aerobic/Anaerobic Culture w Gram Stain (surgical/deep wound)     Status: None (Preliminary result)   Collection Time: 01/24/22  5:04 PM   Specimen: Wound  Result Value Ref Range Status   Specimen Description   Final    WOUND Performed at [facility], [address] [place], [place] [address]    Special Requests   Final    LEFT FOOT Performed at [facility], [address] [place], [place] [address]    Gram Stain   Final    NO WBC SEEN FEW GRAM POSITIVE COCCI IN PAIRS FEW GRAM NEGATIVE RODS Performed at [facility], [address], [place], [place] [address]    Culture   Final    CULTURE REINCUBATED FOR BETTER GROWTH NO ANAEROBES ISOLATED; CULTURE IN PROGRESS FOR 5 DAYS    Report Status PENDING  Incomplete    Procedures and diagnostic studies:  No results found.             LOS: 4 days   Calder Oblinger  [facility]   Pager on [email or web address]. If 7PM-7AM, please contact night-coverage at [email or web address]     01/27/2022, 11:35 AM

## 2022-01-28 DIAGNOSIS — N1831 Chronic kidney disease, stage 3a: Secondary | ICD-10-CM | POA: Diagnosis not present

## 2022-01-28 DIAGNOSIS — S91302A Unspecified open wound, left foot, initial encounter: Secondary | ICD-10-CM | POA: Diagnosis not present

## 2022-01-28 DIAGNOSIS — Z515 Encounter for palliative care: Secondary | ICD-10-CM | POA: Diagnosis not present

## 2022-01-28 DIAGNOSIS — Z7189 Other specified counseling: Secondary | ICD-10-CM | POA: Diagnosis not present

## 2022-01-28 DIAGNOSIS — N179 Acute kidney failure, unspecified: Secondary | ICD-10-CM | POA: Diagnosis not present

## 2022-01-28 DIAGNOSIS — E876 Hypokalemia: Secondary | ICD-10-CM

## 2022-01-28 DIAGNOSIS — A419 Sepsis, unspecified organism: Secondary | ICD-10-CM | POA: Diagnosis not present

## 2022-01-28 LAB — RENAL FUNCTION PANEL
Albumin: 3.1 g/dL — ABNORMAL LOW (ref 3.5–5.0)
Anion gap: 7 (ref 5–15)
BUN: 29 mg/dL — ABNORMAL HIGH (ref 8–23)
CO2: 27 mmol/L (ref 22–32)
Calcium: 8.9 mg/dL (ref 8.9–10.3)
Chloride: 104 mmol/L (ref 98–111)
Creatinine, Ser: 1.46 mg/dL — ABNORMAL HIGH (ref 0.44–1.00)
GFR, Estimated: 38 mL/min — ABNORMAL LOW (ref >60)
Glucose, Bld: 83 mg/dL (ref 70–99)
Phosphorus: 2.9 mg/dL (ref 2.5–4.6)
Potassium: 3.4 mmol/L — ABNORMAL LOW (ref 3.5–5.1)
Sodium: 138 mmol/L (ref 135–145)

## 2022-01-28 LAB — CBC
HCT: 24.8 % — ABNORMAL LOW (ref 36.0–46.0)
Hemoglobin: 7.6 g/dL — ABNORMAL LOW (ref 12.0–15.0)
MCH: 26.7 pg (ref 26.0–34.0)
MCHC: 30.6 g/dL (ref 30.0–36.0)
MCV: 87 fL (ref 80.0–100.0)
Platelets: 222 10*3/uL (ref 150–400)
RBC: 2.85 MIL/uL — ABNORMAL LOW (ref 3.87–5.11)
RDW: 14.2 % (ref 11.5–15.5)
WBC: 7.4 10*3/uL (ref 4.0–10.5)
nRBC: 0 % (ref 0.0–0.2)

## 2022-01-28 LAB — VANCOMYCIN, RANDOM: Vancomycin Rm: 5 ug/mL

## 2022-01-28 LAB — MAGNESIUM: Magnesium: 1.8 mg/dL (ref 1.7–2.4)

## 2022-01-28 LAB — VITAMIN B12: Vitamin B-12: 748 pg/mL (ref 180–914)

## 2022-01-28 MED ORDER — VANCOMYCIN HCL IN DEXTROSE 1-5 GM/200ML-% IV SOLN
1000.0000 mg | INTRAVENOUS | Status: DC
  Administered 2022-01-28: 1000 mg via INTRAVENOUS
  Filled 2022-01-28 (×2): qty 200

## 2022-01-28 MED ORDER — BUTALBITAL-APAP-CAFFEINE 50-325-40 MG PO TABS
1.0000 | ORAL_TABLET | Freq: Four times a day (QID) | ORAL | Status: DC | PRN
  Administered 2022-01-28 (×2): 1 via ORAL
  Filled 2022-01-28 (×3): qty 1

## 2022-01-28 MED ORDER — SODIUM CHLORIDE 0.9 % IV SOLN
2.0000 g | Freq: Two times a day (BID) | INTRAVENOUS | Status: DC
  Administered 2022-01-28 (×2): 2 g via INTRAVENOUS
  Filled 2022-01-28: qty 12.5
  Filled 2022-01-28: qty 2
  Filled 2022-01-28: qty 12.5

## 2022-01-28 NOTE — Progress Notes (Signed)
SUBJECTIVE: Feeling much better   Vitals:   01/28/22 0400 01/28/22 0500 01/28/22 0600 01/28/22 0803  BP: (!) 129/57 134/61 (!) 135/53   Pulse: 63 66 61   Resp: 13 14 (!) 0   Temp: 98.2 F (36.8 C)     TempSrc: Oral     SpO2: 94% 95% 96% 100%  Weight:      Height:        Intake/Output Summary (Last 24 hours) at 01/28/2022 0857 Last data filed at 01/28/2022 0500 Gross per 24 hour  Intake 100.04 ml  Output 1350 ml  Net -1249.96 ml    LABS: Basic Metabolic Panel: Recent Labs    01/27/22 0420 01/28/22 0550  NA 136 138  K 3.4* 3.4*  CL 101 104  CO2 27 27  GLUCOSE 83 83  BUN 35* 29*  CREATININE 1.85* 1.46*  CALCIUM 8.9 8.9  MG 1.8 1.8  PHOS 3.0 2.9   Liver Function Tests: Recent Labs    01/27/22 0420 01/28/22 0550  ALBUMIN 3.2* 3.1*   No results for input(s): "LIPASE", "AMYLASE" in the last 72 hours. CBC: Recent Labs    01/27/22 0420 01/28/22 0550  WBC 8.7 7.4  HGB 7.7* 7.6*  HCT 24.2* 24.8*  MCV 84.3 87.0  PLT 237 222   Cardiac Enzymes: No results for input(s): "CKTOTAL", "CKMB", "CKMBINDEX", "TROPONINI" in the last 72 hours. BNP: Invalid input(s): "POCBNP" D-Dimer: No results for input(s): "DDIMER" in the last 72 hours. Hemoglobin A1C: No results for input(s): "HGBA1C" in the last 72 hours. Fasting Lipid Panel: No results for input(s): "CHOL", "HDL", "LDLCALC", "TRIG", "CHOLHDL", "LDLDIRECT" in the last 72 hours. Thyroid Function Tests: No results for input(s): "TSH", "T4TOTAL", "T3FREE", "THYROIDAB" in the last 72 hours.  Invalid input(s): "FREET3" Anemia Panel: Recent Labs    01/27/22 0420 01/27/22 1617  VITAMINB12  --  748  FERRITIN 155  --   TIBC 213*  --   IRON 47  --      PHYSICAL EXAM General: Well developed, well nourished, in no acute distress HEENT:  Normocephalic and atramatic Neck:  No JVD.  Lungs: Clear bilaterally to auscultation and percussion. Heart: HRRR . Normal S1 and S2 without gallops or murmurs.  Abdomen: Bowel  sounds are positive, abdomen soft and non-tender  Msk:  Back normal, normal gait. Normal strength and tone for age. Extremities: No clubbing, cyanosis or edema.   Neuro: Alert and oriented X 3. Psych:  Good affect, responds appropriately  TELEMETRY: Sinus rhythm  ASSESSMENT AND PLAN: Congestive heart failure/septic shock.  Patient is significantly improved and can be moved to telemetry.  Principal Problem:   Septic shock ([place]) Active Problems:   Multiple sclerosis (HCC)   Obesity, Class III, BMI 40-49.9 (morbid obesity) (HCC)   Cystitis due to Pseudomonas   Chronic kidney disease (CKD), stage III (moderate) (HCC)   Hypokalemia   COPD (chronic obstructive pulmonary disease) (HCC)   Non healing left heel wound   AKI (acute kidney injury) ([place])   Acute on chronic diastolic CHF (congestive heart failure) ([place])   Hyperkalemia    [name] A, MD, [facility] 01/28/2022 8:57 AM

## 2022-01-28 NOTE — Progress Notes (Addendum)
Progress Note    [name]  ZOX:[id number] DOB: [birth year]  DOA: 01/23/2022 PCP: Kirk Ruths, MD      Brief Narrative:    Medical records reviewed and are as summarized below:  [name] is a 71 y.o. female  with medical history significant for multiple sclerosis,  neurogenic bladder, CKD stage IIIa, subdural hematoma s/p craniotomy in July 2023, peripheral vascular disease, morbid obesity, OSA, hyperlipidemia, COPD, follicular intra-abdominal lymphoma, major depressive disorder, recent hospitalization from 12/22/2021 through 12/26/2021 for management of nonhealing left ulcer and cellulitis of the left lower leg (bone biopsy was negative for osteomyelitis) and discharged on 2-week course of Augmentin.  She was hospitalized again from 9/13 through 01/12/2022 because of pain in the left foot, bloody drainage from the left heel ulcer.  She was seen by the podiatrist on visit.  She was treated with IV Unasyn for 48 hours and was discharged to SNF for local wound care.  She presented to the hospital again on 01/23/2022 because of hypotension and concern for possible sepsis.  She was admitted to the ICU for acute exacerbation of chronic diastolic CHF (BNP 8,119) and septic shock complicated by AKI on CKD stage IIIa with hyperkalemia.  She was treated with empiric IV antibiotics, IV hydrocortisone and vasopressors.  She has been weaned off Levophed.  She was transferred to the hospitalist service on 01/27/2022.       Assessment/Plan:   Principal Problem:   Septic shock (HCC) Active Problems:   Cystitis due to Pseudomonas   AKI (acute kidney injury) ([place])   Acute on chronic diastolic CHF (congestive heart failure) (HCC)   Non healing left heel wound   Chronic kidney disease (CKD), stage III (moderate) (HCC)   Multiple sclerosis (HCC)   Obesity, Class III, BMI 40-49.9 (morbid obesity) (HCC)   Hypokalemia   COPD (chronic obstructive pulmonary disease) (HCC)    Hyperkalemia   Body mass index is 50.63 kg/m.  (Morbid obesity)    Septic shock secondary to Pseudomonas UTI and suspected infected left foot ulcer with cellulitis: Continue empiric IV antibiotics (cefepime, vancomycin and Flagyl). BP has been okay off Levophed and midodrine.  AKI on CKD stage IIIa: She required hemodialysis support for AKI.  However, creatinine has steadily improved and dialysis has been discontinued..  Follow-up with nephrologist.  Acute on chronic diastolic CHF, paroxysmal atrial fibrillation: Compensated.  Follow-up with cardiologist.  Hypokalemia: Defer treatment to nephrologist  Anemia of chronic disease, normocytic anemia: Vitamin B12 and iron levels were normal.  Monitor H&H.  Left heel decubitus ulcer, left anterior ankle wound, cellulitis left foot: MRI of the left foot is concerning for chronic osteomyelitis involving the posterior aspect of the calcaneus and findings concerning for diabetic myopathy/myositis and left foot cellulitis.  Follow-up with podiatrist for further recommendations.  Multiple sclerosis with neurogenic bladder: Continue indwelling Foley catheter.  Hyperkalemia and hyponatremia: Resolved   Other comorbidities include COPD, OSA  Transfer to medsurg  Diet Order             Diet renal with fluid restriction Fluid restriction: 1200 mL Fluid; Room service appropriate? Yes; Fluid consistency: Thin  Diet effective now                            Consultants: Intensivist Cardiologist Nephrologist Podiatrist Palliative care  Procedures: Left IJ central line on 01/23/2022    Medications:    vitamin C  500  mg Oral BID   buPROPion  300 mg Oral Daily   Chlorhexidine Gluconate Cloth  6 each Topical Q0600   clonazePAM  0.5 mg Oral BID   DULoxetine  60 mg Oral BH-q7a   feeding supplement (NEPRO CARB STEADY)  237 mL Oral TID BM   gabapentin  300 mg Oral QHS   heparin  5,000 Units Subcutaneous Q8H    ipratropium-albuterol  3 mL Nebulization BID   multivitamin  1 tablet Oral QHS   traZODone  50 mg Oral QHS   vortioxetine HBr  5 mg Oral Daily   zinc sulfate  220 mg Oral Daily   Continuous Infusions:  anticoagulant sodium citrate     ceFEPime (MAXIPIME) IV 2 g (01/28/22 1131)   metronidazole 500 mg (01/28/22 1009)   vancomycin       Anti-infectives (From admission, onward)    Start     Dose/Rate Route Frequency Ordered Stop   01/28/22 1400  vancomycin (VANCOCIN) IVPB 1000 mg/200 mL premix        1,000 mg 200 mL/hr over 60 Minutes Intravenous Every 24 hours 01/28/22 1244     01/28/22 1200  vancomycin (VANCOCIN) IVPB 1000 mg/200 mL premix  Status:  Discontinued        1,000 mg 200 mL/hr over 60 Minutes Intravenous Every M-W-F (Hemodialysis) 01/25/22 1459 01/28/22 1244   01/28/22 1200  ceFEPIme (MAXIPIME) 2 g in sodium chloride 0.9 % 100 mL IVPB        2 g 200 mL/hr over 30 Minutes Intravenous Every 12 hours 01/28/22 1024     01/25/22 1000  vancomycin (VANCOCIN) IVPB 1000 mg/200 mL premix  Status:  Discontinued        1,000 mg 200 mL/hr over 60 Minutes Intravenous  Once 01/25/22 0807 01/25/22 1233   01/24/22 1800  ceFEPIme (MAXIPIME) 1 g in sodium chloride 0.9 % 100 mL IVPB  Status:  Discontinued        1 g 200 mL/hr over 30 Minutes Intravenous Every 24 hours 01/24/22 1116 01/28/22 1024   01/24/22 1114  vancomycin (VANCOCIN) IVPB 1000 mg/200 mL premix  Status:  Discontinued        1,000 mg 200 mL/hr over 60 Minutes Intravenous Every Dialysis 01/24/22 1115 01/25/22 1459   01/24/22 1000  metroNIDAZOLE (FLAGYL) IVPB 500 mg        500 mg 100 mL/hr over 60 Minutes Intravenous Every 12 hours 01/24/22 0830     01/23/22 1015  vancomycin (VANCOREADY) IVPB 2000 mg/400 mL        2,000 mg 200 mL/hr over 120 Minutes Intravenous  Once 01/23/22 1003 01/23/22 1345   01/23/22 1015  piperacillin-tazobactam (ZOSYN) IVPB 3.375 g        3.375 g 100 mL/hr over 30 Minutes Intravenous  Once 01/23/22  1003 01/23/22 1050              Family Communication/Anticipated D/C date and plan/Code Status   DVT prophylaxis: heparin injection 5,000 Units Start: 01/23/22 2200     Code Status: DNR  Family Communication: None Disposition Plan: Plan to discharge to SNF in 3 to 5 days   Status is: Inpatient Remains inpatient appropriate because: On IV antibiotics       Subjective:   Interval events noted. No complaints.  No shortness of breath or chest pain.  No pain in the abdomen or legs.  Objective:    Vitals:   01/28/22 0400 01/28/22 0500 01/28/22 0600 01/28/22 0803  BP: Marland Kitchen)  129/57 134/61 (!) 135/53   Pulse: 63 66 61   Resp: 13 14 (!) 0   Temp: 98.2 F (36.8 C)     TempSrc: Oral     SpO2: 94% 95% 96% 100%  Weight:      Height:       No data found.   Intake/Output Summary (Last 24 hours) at 01/28/2022 1332 Last data filed at 01/28/2022 0500 Gross per 24 hour  Intake 34.81 ml  Output 1100 ml  Net -1065.19 ml   Filed Weights   01/24/22 1751 01/25/22 0930 01/25/22 1307  Weight: 135.9 kg 135.1 kg 133.8 kg    Exam:  GEN: NAD SKIN: Warm and dry. Left heel ulcer, left anterior ankle wound EYES: No pallor or icterus ENT: MMM CV: RRR PULM: CTA B ABD: soft, obese, NT, +BS CNS: AAO x 3, non focal EXT: Left leg and foot edema with erythema GU: Foley catheter draining amber urine        Data Reviewed:   I have personally reviewed following labs and imaging studies:  Labs: Labs show the following:   Basic Metabolic Panel: Recent Labs  Lab 01/23/22 1038 01/23/22 2255 01/24/22 0533 01/25/22 0534 01/26/22 0455 01/27/22 0420 01/28/22 0550  NA  --  125* 127* 129* 133* 136 138  K  --  5.1 5.4* 4.6 3.4* 3.4* 3.4*  CL  --  93* 95* 92* 99 101 104  CO2  --  20* 20* 21* '27 27 27  '$ GLUCOSE  --  120* 125* 105* 91 83 83  BUN  --  65* 70* 57* 39* 35* 29*  CREATININE  --  5.48* 5.97* 4.53* 2.54* 1.85* 1.46*  CALCIUM  --  8.0* 8.3* 8.7* 8.4* 8.9 8.9  MG  2.5* 2.2 2.3  --   --  1.8 1.8  PHOS  --   --  7.4*  --   --  3.0 2.9   GFR Estimated Creatinine Clearance: 48.1 mL/min (A) (by C-G formula based on SCr of 1.46 mg/dL (H)). Liver Function Tests: Recent Labs  Lab 01/23/22 0937 01/27/22 0420 01/28/22 0550  AST 21  --   --   ALT 22  --   --   ALKPHOS 64  --   --   BILITOT 0.8  --   --   PROT 6.5  --   --   ALBUMIN 3.3* 3.2* 3.1*   No results for input(s): "LIPASE", "AMYLASE" in the last 168 hours. No results for input(s): "AMMONIA" in the last 168 hours. Coagulation profile Recent Labs  Lab 01/23/22 0937  INR 1.3*    CBC: Recent Labs  Lab 01/23/22 0937 01/24/22 0533 01/25/22 0534 01/26/22 0455 01/27/22 0420 01/28/22 0550  WBC 8.2 11.4* 10.5 7.5 8.7 7.4  NEUTROABS 7.1  --   --   --   --   --   HGB 8.4* 8.5* 8.4* 7.6* 7.7* 7.6*  HCT 26.5* 26.6* 25.9* 23.4* 24.2* 24.8*  MCV 84.4 83.1 82.7 83.0 84.3 87.0  PLT 243 306 302 226 237 222   Cardiac Enzymes: No results for input(s): "CKTOTAL", "CKMB", "CKMBINDEX", "TROPONINI" in the last 168 hours. BNP (last 3 results) No results for input(s): "PROBNP" in the last 8760 hours. CBG: Recent Labs  Lab 01/23/22 1638  GLUCAP 102*   D-Dimer: No results for input(s): "DDIMER" in the last 72 hours. Hgb A1c: No results for input(s): "HGBA1C" in the last 72 hours. Lipid Profile: No results for input(s): "CHOL", "HDL", "LDLCALC", "  TRIG", "CHOLHDL", "LDLDIRECT" in the last 72 hours. Thyroid function studies: No results for input(s): "TSH", "T4TOTAL", "T3FREE", "THYROIDAB" in the last 72 hours.  Invalid input(s): "FREET3" Anemia work up: Recent Labs    01/27/22 0420 01/27/22 1617  VITAMINB12  --  748  FERRITIN 155  --   TIBC 213*  --   IRON 47  --    Sepsis Labs: Recent Labs  Lab 01/23/22 0937 01/24/22 0533 01/25/22 0534 01/26/22 0455 01/27/22 0420 01/28/22 0550  PROCALCITON 0.28 0.22 0.39  --   --   --   WBC 8.2 11.4* 10.5 7.5 8.7 7.4  LATICACIDVEN 0.7  --    --   --   --   --     Microbiology Recent Results (from the past 240 hour(s))  Blood Culture (routine x 2)     Status: None (Preliminary result)   Collection Time: 01/23/22  9:37 AM   Specimen: BLOOD  Result Value Ref Range Status   Specimen Description BLOOD RIGHT ANTECUBITAL  Final   Special Requests   Final    BOTTLES DRAWN AEROBIC AND ANAEROBIC Blood Culture adequate volume   Culture   Final    NO GROWTH 4 DAYS Performed at [facility], [address], [place], [place] [address]    Report Status PENDING  Incomplete  Blood Culture (routine x 2)     Status: None (Preliminary result)   Collection Time: 01/23/22  9:37 AM   Specimen: BLOOD  Result Value Ref Range Status   Specimen Description BLOOD LEFT ANTECUBITAL  Final   Special Requests   Final    BOTTLES DRAWN AEROBIC AND ANAEROBIC Blood Culture results may not be optimal due to an inadequate volume of blood received in culture bottles   Culture   Final    NO GROWTH 4 DAYS Performed at [facility], [address] [place], [place] [address]    Report Status PENDING  Incomplete  Urine Culture     Status: Abnormal   Collection Time: 01/23/22  9:37 AM   Specimen: In/Out Cath Urine  Result Value Ref Range Status   Specimen Description   Final    IN/OUT CATH URINE Performed at [facility], [address] [place], [place] [address]    Special Requests   Final    NONE Performed at [facility], [address] [place], [place] [address]    Culture >=100,000 COLONIES/mL PSEUDOMONAS AERUGINOSA (A)  Final   Report Status 01/25/2022 FINAL  Final   Organism ID, Bacteria PSEUDOMONAS AERUGINOSA (A)  Final      Susceptibility   Pseudomonas aeruginosa - MIC*    CEFTAZIDIME 2 SENSITIVE Sensitive     CIPROFLOXACIN <=0.25 SENSITIVE Sensitive     GENTAMICIN <=1 SENSITIVE Sensitive     IMIPENEM 2 SENSITIVE Sensitive     PIP/TAZO <=4 SENSITIVE Sensitive     CEFEPIME 4 SENSITIVE Sensitive      * >=100,000 COLONIES/mL PSEUDOMONAS AERUGINOSA  MRSA Next Gen by PCR, Nasal     Status: None   Collection Time: 01/23/22  4:54 PM   Specimen: Nasal Mucosa; Nasal Swab  Result Value Ref Range Status   MRSA by PCR Next Gen NOT DETECTED NOT DETECTED Final    Comment: (NOTE) The GeneXpert MRSA Assay (FDA approved for NASAL specimens only), is one component of a comprehensive MRSA colonization surveillance program. It is not intended to diagnose MRSA infection nor to guide or monitor treatment for MRSA infections.  Test performance is not FDA approved in patients less than 79 years old. Performed at [facility], [address] [place], [place] [address]   Culture, blood (Routine X 2) w Reflex to ID Panel     Status: None (Preliminary result)   Collection Time: 01/23/22  5:02 PM   Specimen: BLOOD RIGHT HAND  Result Value Ref Range Status   Specimen Description BLOOD RIGHT HAND  Final   Special Requests   Final    BOTTLES DRAWN AEROBIC AND ANAEROBIC Blood Culture adequate volume   Culture   Final    NO GROWTH 4 DAYS Performed at [facility], [name] [place], [place] [address]    Report Status PENDING  Incomplete  Aerobic/Anaerobic Culture w Gram Stain (surgical/deep wound)     Status: None (Preliminary result)   Collection Time: 01/24/22  5:04 PM   Specimen: Wound  Result Value Ref Range Status   Specimen Description   Final    WOUND Performed at [facility], [place] [address], [place], [place] [address]    Special Requests   Final    LEFT FOOT Performed at [facility], [place], [place] [address]    Gram Stain   Final    NO WBC SEEN FEW GRAM POSITIVE COCCI IN PAIRS FEW GRAM NEGATIVE RODS Performed at [facility], [place] [address], [place], [place] [address]    Culture   Final    ABUNDANT MULTIPLE ORGANISMS PRESENT, NONE PREDOMINANT NO STAPHYLOCOCCUS AUREUS ISOLATED NO ANAEROBES ISOLATED; CULTURE IN PROGRESS  FOR 5 DAYS    Report Status PENDING  Incomplete    Procedures and diagnostic studies:  MR ANKLE LEFT WO CONTRAST  Result Date: 01/27/2022 CLINICAL DATA:  Progressive left anterior ankle ulcer and heel ulcer. Preop imaging for possible amputation. Evaluate for osteomyelitis. EXAM: MRI OF THE LEFT ANKLE WITHOUT CONTRAST TECHNIQUE: Multiplanar, multisequence MR imaging of the ankle was performed. No intravenous contrast was administered. COMPARISON:  Left foot radiographs 01/23/2022 and 01/17/2022; FINDINGS: TENDONS Peroneal: The peroneus longus and brevis tendons are intact. Posteromedial: Intact tibialis posterior, flexor digitorum longus, and flexor hallucis longus tendons. Anterior: The tibialis anterior, extensor hallucis longus, and extensor digitorum longus tendons are intact. Achilles: Intact. Plantar Fascia: Mild-to-moderate plantar calcaneal heel spur. No plantar fasciitis. High-grade edema/fluid within the flexor digitorum brevis musculature within the visualized plantar midfoot. LIGAMENTS Lateral: The anterior and posterior talofibular, anterior and posterior tibiofibular, and calcaneofibular ligaments are intact. Medial: The tibiotalar deep deltoid and tibial spring ligaments are intact. CARTILAGE Ankle Joint: Mild thinning of the tibiotalar cartilage, greatest at the far medial aspect of the talar dome. Subtalar Joints/Sinus Tarsi: Fat is preserved within sinus tarsi. Bones: Mild cartilage thinning of the talonavicular, navicular-cuneiform, and tarsometatarsal joints. There is again a soft tissue defect within the posterior heel just distal to the Achilles tendon insertion, contacting bone. There is again moderate marrow edema throughout the posterolateral and posterior aspect of the calcaneus, very similar to prior. This is again consistent with chronic osteomyelitis. Other: There is high-grade edema within the abductor hallucis and flexor digitorum brevis muscles with mild fluid around the  peripheral fascial planes. There is complete fatty infiltration as of the abductor digiti minimi muscle as seen on prior 01/17/2022 axial T1 weighted sequence. There is again moderate swelling and fluid bright edema within the visualized dorsal midfoot to forefoot subcutaneous fat at the level of the metatarsals. IMPRESSION: 1. There is again a soft  tissue defect within the posterior heel just distal to the Achilles tendon insertion, contacting bone. There is again moderate marrow edema throughout the posterolateral and posterior aspect of the calcaneus, consistent with chronic osteomyelitis. 2. High-grade edema within the abductor hallucis and flexor digitorum brevis muscles with mild fluid around the peripheral fascial planes. This is compatible with diabetic myopathy/myositis. 3. Moderate swelling and fluid bright edema within the visualized dorsal midfoot to forefoot subcutaneous fat at the level of the metatarsals. This is suggestive of cellulitis. Electronically Signed   By: Yvonne Kendall M.D.   On: 01/27/2022 16:24               LOS: 5 days   Jaicob Dia  [facility]   Pager on [email or web address]. If 7PM-7AM, please contact night-coverage at [email or web address]     01/28/2022, 1:32 PM

## 2022-01-28 NOTE — Consult Note (Signed)
Pharmacy Antibiotic Note  [name] is a 71 y.o. female with PMH including MS, COPD, follicular lymphoma, morbid obesity, PVD with lower extremity ulceration, depression/anxiety admitted on 01/23/2022 with  septic shock secondary to left foot infection and renal failure necessitating renal replacement therapy .  Pharmacy has been consulted for Vancomycin and Cefepime dosing. Patient is also ordered Metronidazole. Bone culture from 8/28 with Kocuria rosea (? Skin contaminant). Previous cultures from L heel have grown pseudomonas (pan-susc in 07/2020 and 12/2020).    Assessment: Day 6 antibiotics - vancomycin, metronidazole, cefepime Renal function has improved s/p 3 consecutive days of  HD 9/27-29. No plans for further HD at this time per improved renal function  Scr down to 1.46 and improved UOP WBC WNL Afebrile 10/1 MRI L ankle: chronic OM L calcaneous, myositis, and cellulitis 9/28 L foot cx: pending (abundant organisms, none predominant) 9/27 Ucx: pan-susc pseudmonas Vancomycin level (random) = 5 mcg/mL at 11:18, last dose of vancomycin was 1gm 9/29 at 12:11.   Plan: No plans for further HD,  based on random level and improved renal function, start vancomycin 1gm IV q24h AUC goal 400-600 Calculated AUC = 503, Scr used: 1.46, Vd 0.5 L/kg Monitor Scr (ordered renal function panel) Change Cefepime to 2gm q12h for improved renal function Patient is also on Metronidazole 500 mg Q12H Monitor renal function and culture results  F/u ABI and possible plan for amputation   Height: '5\' 4"'$  (162.6 cm) Weight: 133.8 kg (294 lb 15.6 oz) IBW/kg (Calculated) : 54.7  Temp (24hrs), Avg:97.6 F (36.4 C), Min:97.1 F (36.2 C), Max:98.2 F (36.8 C)  Recent Labs  Lab 01/23/22 0937 01/23/22 2255 01/24/22 0533 01/25/22 0534 01/26/22 0455 01/27/22 0420 01/28/22 0550  WBC 8.2  --  11.4* 10.5 7.5 8.7 7.4  CREATININE 7.07*   < > 5.97* 4.53* 2.54* 1.85* 1.46*  LATICACIDVEN 0.7  --   --   --   --    --   --   VANCORANDOM  --   --   --  10  --   --   --    < > = values in this interval not displayed.     Estimated Creatinine Clearance: 48.1 mL/min (A) (by C-G formula based on SCr of 1.46 mg/dL (H)).    No Known Allergies  Antimicrobials this admission: 9/27 Zosyn 3.375g IV x 1 9/27 Vancomycin >>  9/28 Cefepime >>  9/28 Metronidazole >>   Microbiology results: 9/27 BCx: NG2D 9/27 UCx: >100k Pseudomonas aeruginosa (pansensitive)  9/27 MRSA PCR: (-) 9/28 Wound Cx: few GPC in pairs, few GNR (TYTR)  Thank you for allowing pharmacy to be a part of this patient's care.  Doreene Eland, PharmD, BCPS, BCIDP Work Cell: [phone] 01/28/2022 10:23 AM

## 2022-01-28 NOTE — Progress Notes (Signed)
Palliative: [name] is lying quietly in bed.  She greets me, making and mostly keeping eye contact.  She is alert and oriented x3, able to make her basic needs known.  There is no family at bedside at this time.  We talk about her acute health concerns and her recovery of some kidney function.  I shared that she will be transferred to the floor for continued monitoring.  We talk about podiatry visit.  She tells me that she has had lower extremity wounds for several years.  We talk about surgical intervention.  [name] states that she would except surgery on her foot, but not amputation.  I share, "even at the cost of your life?".  She tells me that she had not thought of this.  I encouraged her to consider what she does and does not want out what costs.  We talked about the benefits of outpatient palliative services for continued goals of care discussions.  Provider choice offered.  She is agreeable to outpatient palliative services with [facility].  She tells me that she believes her husband will be able to continue to provide care for her.  She is active with [facility] for home health services.  Conference with transition of care team related to patient condition, needs, goals of care.  Plan:  At this point continue to treat the treatable but no CPR or intubation.  Time for outcomes.  Home with home health, active with [facility].  Agrees to outpatient palliative services through [facility].  17 minutes Quinn Axe, NP Palliative medicine team Team phone [phone] Greater than 50% of this time was spent counseling and coordinating care related to the above assessment and plan.

## 2022-01-28 NOTE — Progress Notes (Signed)
[facility]  ROUNDING NOTE   Subjective:   Patient is resting comfortably without any complaints.  Remains stable on room air.  Noted to have improvement in renal function and urine output.   Objective:  Vital signs in last 24 hours:  Temp:  [97.1 F (36.2 C)-98.2 F (36.8 C)] 98.2 F (36.8 C) (10/02 0400) Pulse Rate:  [57-80] 61 (10/02 0600) Resp:  [0-17] 0 (10/02 0600) BP: (78-173)/(43-154) 135/53 (10/02 0600) SpO2:  [92 %-100 %] 100 % (10/02 0803)  Weight change:  Filed Weights   01/24/22 1751 01/25/22 0930 01/25/22 1307  Weight: 135.9 kg 135.1 kg 133.8 kg    Intake/Output: I/O last 3 completed shifts: In: 300 [IV Piggyback:300] Out: 2200 [Urine:2200]   Intake/Output this shift:  No intake/output data recorded.  Physical Exam: General: NAD  Head: Normocephalic, atraumatic. Moist oral mucosal membranes  Eyes: Anicteric  Lungs:  Diminished in bases, normal effort  Heart: Regular rate and rhythm  Abdomen:  Soft, nontender, obese  Extremities:  trace peripheral edema.  Left foot in soft support boot  Neurologic: Nonfocal, moving all four extremities  Skin: No lesions  Access: Lt IJ temp cath    Basic Metabolic Panel: Recent Labs  Lab 01/23/22 1038 01/23/22 2255 01/24/22 0533 01/25/22 0534 01/26/22 0455 01/27/22 0420 01/28/22 0550  NA  --  125* 127* 129* 133* 136 138  K  --  5.1 5.4* 4.6 3.4* 3.4* 3.4*  CL  --  93* 95* 92* 99 101 104  CO2  --  20* 20* 21* '27 27 27  '$ GLUCOSE  --  120* 125* 105* 91 83 83  BUN  --  65* 70* 57* 39* 35* 29*  CREATININE  --  5.48* 5.97* 4.53* 2.54* 1.85* 1.46*  CALCIUM  --  8.0* 8.3* 8.7* 8.4* 8.9 8.9  MG 2.5* 2.2 2.3  --   --  1.8 1.8  PHOS  --   --  7.4*  --   --  3.0 2.9     Liver Function Tests: Recent Labs  Lab 01/23/22 0937 01/27/22 0420 01/28/22 0550  AST 21  --   --   ALT 22  --   --   ALKPHOS 64  --   --   BILITOT 0.8  --   --   PROT 6.5  --   --   ALBUMIN 3.3* 3.2* 3.1*    No results for  input(s): "LIPASE", "AMYLASE" in the last 168 hours. No results for input(s): "AMMONIA" in the last 168 hours.  CBC: Recent Labs  Lab 01/23/22 0937 01/24/22 0533 01/25/22 0534 01/26/22 0455 01/27/22 0420 01/28/22 0550  WBC 8.2 11.4* 10.5 7.5 8.7 7.4  NEUTROABS 7.1  --   --   --   --   --   HGB 8.4* 8.5* 8.4* 7.6* 7.7* 7.6*  HCT 26.5* 26.6* 25.9* 23.4* 24.2* 24.8*  MCV 84.4 83.1 82.7 83.0 84.3 87.0  PLT 243 306 302 226 237 222     Cardiac Enzymes: No results for input(s): "CKTOTAL", "CKMB", "CKMBINDEX", "TROPONINI" in the last 168 hours.  BNP: Invalid input(s): "POCBNP"  CBG: Recent Labs  Lab 01/23/22 1638  GLUCAP 102*     Microbiology: Results for orders placed or performed during the hospital encounter of 01/23/22  Blood Culture (routine x 2)     Status: None (Preliminary result)   Collection Time: 01/23/22  9:37 AM   Specimen: BLOOD  Result Value Ref Range Status   Specimen Description  BLOOD RIGHT ANTECUBITAL  Final   Special Requests   Final    BOTTLES DRAWN AEROBIC AND ANAEROBIC Blood Culture adequate volume   Culture   Final    NO GROWTH 4 DAYS Performed at [facility], [place], [place] [address]    Report Status PENDING  Incomplete  Blood Culture (routine x 2)     Status: None (Preliminary result)   Collection Time: 01/23/22  9:37 AM   Specimen: BLOOD  Result Value Ref Range Status   Specimen Description BLOOD LEFT ANTECUBITAL  Final   Special Requests   Final    BOTTLES DRAWN AEROBIC AND ANAEROBIC Blood Culture results may not be optimal due to an inadequate volume of blood received in culture bottles   Culture   Final    NO GROWTH 4 DAYS Performed at [facility], [address], [place], [place] [address]    Report Status PENDING  Incomplete  Urine Culture     Status: Abnormal   Collection Time: 01/23/22  9:37 AM   Specimen: In/Out Cath Urine  Result Value Ref Range Status   Specimen Description   Final     IN/OUT CATH URINE Performed at [facility], [address], [place], [place] [address]    Special Requests   Final    NONE Performed at [facility], [address] [place], [place] [address]    Culture >=100,000 COLONIES/mL PSEUDOMONAS AERUGINOSA (A)  Final   Report Status 01/25/2022 FINAL  Final   Organism ID, Bacteria PSEUDOMONAS AERUGINOSA (A)  Final      Susceptibility   Pseudomonas aeruginosa - MIC*    CEFTAZIDIME 2 SENSITIVE Sensitive     CIPROFLOXACIN <=0.25 SENSITIVE Sensitive     GENTAMICIN <=1 SENSITIVE Sensitive     IMIPENEM 2 SENSITIVE Sensitive     PIP/TAZO <=4 SENSITIVE Sensitive     CEFEPIME 4 SENSITIVE Sensitive     * >=100,000 COLONIES/mL PSEUDOMONAS AERUGINOSA  MRSA Next Gen by PCR, Nasal     Status: None   Collection Time: 01/23/22  4:54 PM   Specimen: Nasal Mucosa; Nasal Swab  Result Value Ref Range Status   MRSA by PCR Next Gen NOT DETECTED NOT DETECTED Final    Comment: (NOTE) The GeneXpert MRSA Assay (FDA approved for NASAL specimens only), is one component of a comprehensive MRSA colonization surveillance program. It is not intended to diagnose MRSA infection nor to guide or monitor treatment for MRSA infections. Test performance is not FDA approved in patients less than 22 years old. Performed at [facility], [address] [place], [place] [address]   Culture, blood (Routine X 2) w Reflex to ID Panel     Status: None (Preliminary result)   Collection Time: 01/23/22  5:02 PM   Specimen: BLOOD RIGHT HAND  Result Value Ref Range Status   Specimen Description BLOOD RIGHT HAND  Final   Special Requests   Final    BOTTLES DRAWN AEROBIC AND ANAEROBIC Blood Culture adequate volume   Culture   Final    NO GROWTH 4 DAYS Performed at [facility], [address], [place], [place] [address]    Report Status PENDING  Incomplete  Aerobic/Anaerobic Culture w Gram Stain (surgical/deep wound)     Status: None  (Preliminary result)   Collection Time: 01/24/22  5:04 PM   Specimen: Wound  Result Value Ref Range Status   Specimen Description   Final    WOUND  Performed at [facility], [place] [address] [place], [place] [address]    Special Requests   Final    LEFT FOOT Performed at [facility], [place], [place] [address]    Gram Stain   Final    NO WBC SEEN FEW GRAM POSITIVE COCCI IN PAIRS FEW GRAM NEGATIVE RODS Performed at [facility], [place] [address], [place], [place] [address]    Culture   Final    ABUNDANT MULTIPLE ORGANISMS PRESENT, NONE PREDOMINANT NO STAPHYLOCOCCUS AUREUS ISOLATED NO ANAEROBES ISOLATED; CULTURE IN PROGRESS FOR 5 DAYS    Report Status PENDING  Incomplete    Coagulation Studies: No results for input(s): "LABPROT", "INR" in the last 72 hours.   Urinalysis: No results for input(s): "COLORURINE", "LABSPEC", "PHURINE", "GLUCOSEU", "HGBUR", "BILIRUBINUR", "KETONESUR", "PROTEINUR", "UROBILINOGEN", "NITRITE", "LEUKOCYTESUR" in the last 72 hours.  Invalid input(s): "APPERANCEUR"    Imaging: MR ANKLE LEFT WO CONTRAST  Result Date: 01/27/2022 CLINICAL DATA:  Progressive left anterior ankle ulcer and heel ulcer. Preop imaging for possible amputation. Evaluate for osteomyelitis. EXAM: MRI OF THE LEFT ANKLE WITHOUT CONTRAST TECHNIQUE: Multiplanar, multisequence MR imaging of the ankle was performed. No intravenous contrast was administered. COMPARISON:  Left foot radiographs 01/23/2022 and 01/17/2022; FINDINGS: TENDONS Peroneal: The peroneus longus and brevis tendons are intact. Posteromedial: Intact tibialis posterior, flexor digitorum longus, and flexor hallucis longus tendons. Anterior: The tibialis anterior, extensor hallucis longus, and extensor digitorum longus tendons are intact. Achilles: Intact. Plantar Fascia: Mild-to-moderate plantar calcaneal heel spur. No plantar fasciitis. High-grade edema/fluid within the flexor digitorum  brevis musculature within the visualized plantar midfoot. LIGAMENTS Lateral: The anterior and posterior talofibular, anterior and posterior tibiofibular, and calcaneofibular ligaments are intact. Medial: The tibiotalar deep deltoid and tibial spring ligaments are intact. CARTILAGE Ankle Joint: Mild thinning of the tibiotalar cartilage, greatest at the far medial aspect of the talar dome. Subtalar Joints/Sinus Tarsi: Fat is preserved within sinus tarsi. Bones: Mild cartilage thinning of the talonavicular, navicular-cuneiform, and tarsometatarsal joints. There is again a soft tissue defect within the posterior heel just distal to the Achilles tendon insertion, contacting bone. There is again moderate marrow edema throughout the posterolateral and posterior aspect of the calcaneus, very similar to prior. This is again consistent with chronic osteomyelitis. Other: There is high-grade edema within the abductor hallucis and flexor digitorum brevis muscles with mild fluid around the peripheral fascial planes. There is complete fatty infiltration as of the abductor digiti minimi muscle as seen on prior 01/17/2022 axial T1 weighted sequence. There is again moderate swelling and fluid bright edema within the visualized dorsal midfoot to forefoot subcutaneous fat at the level of the metatarsals. IMPRESSION: 1. There is again a soft tissue defect within the posterior heel just distal to the Achilles tendon insertion, contacting bone. There is again moderate marrow edema throughout the posterolateral and posterior aspect of the calcaneus, consistent with chronic osteomyelitis. 2. High-grade edema within the abductor hallucis and flexor digitorum brevis muscles with mild fluid around the peripheral fascial planes. This is compatible with diabetic myopathy/myositis. 3. Moderate swelling and fluid bright edema within the visualized dorsal midfoot to forefoot subcutaneous fat at the level of the metatarsals. This is suggestive of  cellulitis. Electronically Signed   By: Yvonne Kendall M.D.   On: 01/27/2022 16:24     Medications:    anticoagulant sodium citrate     ceFEPime (MAXIPIME) IV 1 g (01/27/22 1852)   metronidazole 500 mg (01/27/22 2204)  vancomycin      vitamin C  500 mg Oral BID   buPROPion  300 mg Oral Daily   Chlorhexidine Gluconate Cloth  6 each Topical Q0600   clonazePAM  0.5 mg Oral BID   DULoxetine  60 mg Oral BH-q7a   feeding supplement (NEPRO CARB STEADY)  237 mL Oral TID BM   gabapentin  300 mg Oral QHS   heparin  5,000 Units Subcutaneous Q8H   ipratropium-albuterol  3 mL Nebulization BID   multivitamin  1 tablet Oral QHS   traZODone  50 mg Oral QHS   vortioxetine HBr  5 mg Oral Daily   zinc sulfate  220 mg Oral Daily   acetaminophen, alteplase, anticoagulant sodium citrate, benzonatate, butalbital-acetaminophen-caffeine, heparin, lidocaine (PF), lidocaine-prilocaine, pentafluoroprop-tetrafluoroeth, polyethylene glycol  Assessment/ Plan:  Ms. [name] is a 71 y.o.  female with past medical conditions including COPD, depression, multiple sclerosis, peripheral vascular disease, hypertension, and ADHD, who was admitted to [facility] on 01/23/2022 for Hyponatremia [E87.1] Septic shock ([place]) [A41.9, R65.21] Acute renal failure, unspecified acute renal failure type ([place]) [N17.9] Cellulitis, unspecified cellulitis site [L03.90] Ulcer of left foot, unspecified ulcer stage ([place]) [L97.529]   Acute kidney injury with hyperkalemia on chronic kidney disease stage IIIa.  Baseline creatinine appears to be 1.27 with GFR 45 on 12/24/2021.  Acute kidney injury likely secondary to cardiorenal syndrome and hypotension.  No IV contrast exposure.  Potassium 6.0 on arrival along with increased creatinine 7.70.   Patient received 3 dialysis treatment sessions since admission.  Holding hemodialysis over the weekend.  Patient has had improvement in her urine output as well as serum creatinine.  No further need of  dialysis is anticipated.  Dialysis catheter can be removed.  Lab Results  Component Value Date   CREATININE 1.46 (H) 01/28/2022   CREATININE 1.85 (H) 01/27/2022   CREATININE 2.54 (H) 01/26/2022    Intake/Output Summary (Last 24 hours) at 01/28/2022 0924 Last data filed at 01/28/2022 0500 Gross per 24 hour  Intake 100.04 ml  Output 1350 ml  Net -1249.96 ml    2.  Acute on chronic diastolic heart failure, decompensated.  ECHO completed on 01/23/22 shows EF 50-55 %.  Noted to have improved urine output.     3. Anemia of chronic kidney disease  Lab Results  Component Value Date   HGB 7.6 (L) 01/28/2022    Management as per primary team.    LOS: [place] , MD [facility] 10/2/20239:24 AM   We will sign off. Please re consult as needed.

## 2022-01-28 NOTE — Progress Notes (Signed)
Patient alert and oriented. On room air. Tolerating diet. Chronic foley. Had an incontinent bowel movement during my shift.  Patient being transferred to room 234, Brittney received report.

## 2022-01-28 NOTE — TOC Progression Note (Signed)
Transition of Care [facility]) - Progression Note    Patient Details  Name: [name] MRN: [id number] Date of Birth: 05-15-1950  [facility] CM/SW Contact  Shelbie Hutching, RN Phone Number: 01/28/2022, 1:40 PM  Clinical Narrative:    Palliative followed up with patient this morning.  Plan is for patient to return home with home health services if able.  Leg wounds are looking worse but kidney function has improved.  Patient does not want to return to SNF.   [facility] is unable to provide services to patient, confirmed with Merleen Nicely that they will be discharging patient.    Referral given to [place] with [facility], she will let me know if they can accept.    Expected Discharge Plan: [place] Barriers to Discharge: Continued Medical Work up  Expected Discharge Plan and Services Expected Discharge Plan: [place]   Discharge Planning Services: CM Consult Post Acute Care Choice: [place] arrangements for the past 2 months: [place] Determinants of Health (SDOH) Interventions    Readmission Risk Interventions    03/26/2021    4:21 PM 01/03/2020    3:37 PM  Readmission Risk Prevention Plan  Transportation Screening Complete Complete  PCP or Specialist Appt within 3-5 Days Complete   Social Work Consult for [place] Planning/Counseling Complete   Palliative Care Screening Complete   Medication Review Press photographer) Complete Complete  PCP or Specialist appointment within 3-5 days of discharge  Complete  HRI or [place]  Complete  [place] Recovery Care/Counseling Consult  Complete  Palliative Care Screening  Not Applicable

## 2022-01-29 ENCOUNTER — Ambulatory Visit: Payer: Medicare HMO | Admitting: Podiatry

## 2022-01-29 DIAGNOSIS — Z7189 Other specified counseling: Secondary | ICD-10-CM | POA: Diagnosis not present

## 2022-01-29 DIAGNOSIS — B965 Pseudomonas (aeruginosa) (mallei) (pseudomallei) as the cause of diseases classified elsewhere: Secondary | ICD-10-CM

## 2022-01-29 DIAGNOSIS — N3080 Other cystitis without hematuria: Secondary | ICD-10-CM

## 2022-01-29 DIAGNOSIS — I5033 Acute on chronic diastolic (congestive) heart failure: Secondary | ICD-10-CM | POA: Diagnosis not present

## 2022-01-29 DIAGNOSIS — N179 Acute kidney failure, unspecified: Secondary | ICD-10-CM | POA: Diagnosis not present

## 2022-01-29 DIAGNOSIS — N1831 Chronic kidney disease, stage 3a: Secondary | ICD-10-CM | POA: Diagnosis not present

## 2022-01-29 DIAGNOSIS — R6521 Severe sepsis with septic shock: Secondary | ICD-10-CM | POA: Diagnosis not present

## 2022-01-29 DIAGNOSIS — S91302A Unspecified open wound, left foot, initial encounter: Secondary | ICD-10-CM | POA: Diagnosis not present

## 2022-01-29 DIAGNOSIS — A419 Sepsis, unspecified organism: Secondary | ICD-10-CM | POA: Diagnosis not present

## 2022-01-29 DIAGNOSIS — Z515 Encounter for palliative care: Secondary | ICD-10-CM | POA: Diagnosis not present

## 2022-01-29 LAB — CBC
HCT: 22.7 % — ABNORMAL LOW (ref 36.0–46.0)
Hemoglobin: 7.3 g/dL — ABNORMAL LOW (ref 12.0–15.0)
MCH: 27.4 pg (ref 26.0–34.0)
MCHC: 32.2 g/dL (ref 30.0–36.0)
MCV: 85.3 fL (ref 80.0–100.0)
Platelets: 160 10*3/uL (ref 150–400)
RBC: 2.66 MIL/uL — ABNORMAL LOW (ref 3.87–5.11)
RDW: 14.2 % (ref 11.5–15.5)
WBC: 7.3 10*3/uL (ref 4.0–10.5)
nRBC: 0 % (ref 0.0–0.2)

## 2022-01-29 LAB — CULTURE, BLOOD (ROUTINE X 2)
Culture: NO GROWTH
Culture: NO GROWTH
Culture: NO GROWTH
Special Requests: ADEQUATE
Special Requests: ADEQUATE

## 2022-01-29 LAB — RENAL FUNCTION PANEL
Albumin: 3 g/dL — ABNORMAL LOW (ref 3.5–5.0)
Anion gap: 7 (ref 5–15)
BUN: 22 mg/dL (ref 8–23)
CO2: 26 mmol/L (ref 22–32)
Calcium: 8.8 mg/dL — ABNORMAL LOW (ref 8.9–10.3)
Chloride: 104 mmol/L (ref 98–111)
Creatinine, Ser: 1.07 mg/dL — ABNORMAL HIGH (ref 0.44–1.00)
GFR, Estimated: 56 mL/min — ABNORMAL LOW (ref >60)
Glucose, Bld: 92 mg/dL (ref 70–99)
Phosphorus: 2.7 mg/dL (ref 2.5–4.6)
Potassium: 3.1 mmol/L — ABNORMAL LOW (ref 3.5–5.1)
Sodium: 137 mmol/L (ref 135–145)

## 2022-01-29 LAB — AEROBIC/ANAEROBIC CULTURE W GRAM STAIN (SURGICAL/DEEP WOUND): Gram Stain: NONE SEEN

## 2022-01-29 LAB — PREPARE RBC (CROSSMATCH)

## 2022-01-29 LAB — MAGNESIUM: Magnesium: 1.6 mg/dL — ABNORMAL LOW (ref 1.7–2.4)

## 2022-01-29 MED ORDER — MAGNESIUM SULFATE 2 GM/50ML IV SOLN
2.0000 g | Freq: Once | INTRAVENOUS | Status: AC
  Administered 2022-01-29: 2 g via INTRAVENOUS
  Filled 2022-01-29: qty 50

## 2022-01-29 MED ORDER — SODIUM CHLORIDE 0.9% IV SOLUTION: Freq: Once | INTRAVENOUS | Status: AC

## 2022-01-29 MED ORDER — POTASSIUM CHLORIDE CRYS ER 20 MEQ PO TBCR
40.0000 meq | EXTENDED_RELEASE_TABLET | Freq: Once | ORAL | Status: AC
  Administered 2022-01-29: 40 meq via ORAL
  Filled 2022-01-29: qty 2

## 2022-01-29 MED ORDER — VANCOMYCIN HCL 1250 MG/250ML IV SOLN
1250.0000 mg | INTRAVENOUS | Status: DC
  Filled 2022-01-29: qty 250

## 2022-01-29 MED ORDER — PIPERACILLIN-TAZOBACTAM 3.375 G IVPB
3.3750 g | Freq: Three times a day (TID) | INTRAVENOUS | Status: DC
  Administered 2022-01-30 – 2022-02-04 (×17): 3.375 g via INTRAVENOUS
  Filled 2022-01-29 (×16): qty 50

## 2022-01-29 MED ORDER — SODIUM CHLORIDE 0.9 % IV SOLN
2.0000 g | Freq: Three times a day (TID) | INTRAVENOUS | Status: DC
  Administered 2022-01-29 (×2): 2 g via INTRAVENOUS
  Filled 2022-01-29: qty 12.5
  Filled 2022-01-29: qty 2
  Filled 2022-01-29: qty 12.5

## 2022-01-29 MED ORDER — ORAL CARE MOUTH RINSE: 15.0000 mL | OROMUCOSAL | Status: DC | PRN

## 2022-01-29 MED ORDER — SODIUM CHLORIDE 0.9 % IV SOLN
2.0000 g | Freq: Three times a day (TID) | INTRAVENOUS | Status: DC
  Filled 2022-01-29: qty 12.5

## 2022-01-29 MED ORDER — SODIUM CHLORIDE 0.9 % IV SOLN: INTRAVENOUS | Status: DC | PRN

## 2022-01-29 MED ORDER — ONDANSETRON HCL 4 MG/2ML IJ SOLN
4.0000 mg | Freq: Three times a day (TID) | INTRAMUSCULAR | Status: DC | PRN
  Administered 2022-01-29 – 2022-02-04 (×2): 4 mg via INTRAVENOUS
  Filled 2022-01-29: qty 2

## 2022-01-29 NOTE — Progress Notes (Signed)
Progress Note    [name]  PRF:[id number] DOB: 12-10-1950  DOA: 01/23/2022 PCP: Kirk Ruths, MD      Brief Narrative:    Medical records reviewed and are as summarized below:  [name] is a 71 y.o. female  with medical history significant for multiple sclerosis,  neurogenic bladder, CKD stage IIIa, subdural hematoma s/p craniotomy in July 2023, peripheral vascular disease, morbid obesity, OSA, hyperlipidemia, COPD, follicular intra-abdominal lymphoma, major depressive disorder, recent hospitalization from 12/22/2021 through 12/26/2021 for management of nonhealing left ulcer and cellulitis of the left lower leg (bone biopsy was negative for osteomyelitis) and discharged on 2-week course of Augmentin.  She was hospitalized again from 9/13 through 01/12/2022 because of pain in the left foot, bloody drainage from the left heel ulcer.  She was seen by the podiatrist on visit.  She was treated with IV Unasyn for 48 hours and was discharged to SNF for local wound care.  She presented to the hospital again on 01/23/2022 because of hypotension and concern for possible sepsis.  She was admitted to the ICU for acute exacerbation of chronic diastolic CHF (BNP 9,357) and septic shock complicated by AKI on CKD stage IIIa with hyperkalemia.  She was treated with empiric IV antibiotics, IV hydrocortisone and vasopressors.  She has been weaned off Levophed.  She was transferred to the hospitalist service on 01/27/2022.       Assessment/Plan:   Principal Problem:   Septic shock (HCC) Active Problems:   Cystitis due to Pseudomonas   AKI (acute kidney injury) ([place])   Acute on chronic diastolic CHF (congestive heart failure) (HCC)   Non healing left heel wound   Chronic kidney disease (CKD), stage III (moderate) (HCC)   Multiple sclerosis (HCC)   Obesity, Class III, BMI 40-49.9 (morbid obesity) (HCC)   Hypokalemia   COPD (chronic obstructive pulmonary disease) (HCC)    Hyperkalemia   Body mass index is 50.63 kg/m.  (Morbid obesity)    Septic shock secondary to Pseudomonas UTI and suspected infected left foot ulcer with cellulitis: Continue empiric IV antibiotics.  ID has been consulted to assist with management. (She initially required Levophed and midodrine for septic shock)  Left heel decubitus ulcer, left anterior ankle wound, cellulitis left foot: MRI of the left foot is concerning for chronic osteomyelitis involving the posterior aspect of the calcaneus and findings concerning for diabetic myopathy/myositis and left foot cellulitis.    Dr. Amalia Hailey, podiatrist, and I discussed the case with the patient at the bedside.  Dr. Amalia Hailey recommended amputation of the left foot to avoid proximal spread of infection that could potentially lead to severe sepsis and death.  Patient decided to proceed with amputation.  Dr. Lucky Cowboy, vascular surgeon has been consulted.  Ravishankar, ID specialist, has been consulted as well.  I was later informed by Aniceto Boss, NP, with palliative care team that patient said she did not want surgery.  AKI on CKD stage IIIa: She required hemodialysis support for AKI.  Creatinine has improved and hemodialysis has been discontinued.  Follow-up with nephrologist.  Acute on chronic diastolic CHF, paroxysmal atrial fibrillation: Compensated.  Follow-up with cardiologist.  Hypokalemia: Replete potassium with oral potassium chloride  Hypomagnesemia, replete magnesium with IV magnesium sulfate.  Worsening anemia of chronic disease, normocytic anemia:  Hemoglobin is down to 7.3.  Hemoglobin was 10.4 on 01/17/2022.  Discussed risks, benefits and alternatives to blood transfusion.  Patient is agreeable to blood transfusion.  Transfuse 1 unit  of packed red blood cells and monitor H&H. Vitamin B12 and iron levels were normal.  Monitor H&H.  Multiple sclerosis with neurogenic bladder: Continue indwelling Foley catheter.  Hyperkalemia and hyponatremia:  Resolved   Other comorbidities include COPD, OSA     Diet Order             Diet renal with fluid restriction Fluid restriction: 1200 mL Fluid; Room service appropriate? Yes; Fluid consistency: Thin  Diet effective now                            Consultants: Intensivist Cardiologist Nephrologist Podiatrist Palliative care Vascular surgeon ID specialist  Procedures: Left IJ central line on 01/23/2022    Medications:    sodium chloride   Intravenous Once   vitamin C  500 mg Oral BID   buPROPion  300 mg Oral Daily   Chlorhexidine Gluconate Cloth  6 each Topical Q0600   clonazePAM  0.5 mg Oral BID   DULoxetine  60 mg Oral BH-q7a   feeding supplement (NEPRO CARB STEADY)  237 mL Oral TID BM   gabapentin  300 mg Oral QHS   heparin  5,000 Units Subcutaneous Q8H   ipratropium-albuterol  3 mL Nebulization BID   multivitamin  1 tablet Oral QHS   potassium chloride  40 mEq Oral Once   traZODone  50 mg Oral QHS   vortioxetine HBr  5 mg Oral Daily   zinc sulfate  220 mg Oral Daily   Continuous Infusions:  anticoagulant sodium citrate     ceFEPime (MAXIPIME) IV Stopped (01/29/22 0700)   magnesium sulfate bolus IVPB     metronidazole Stopped (01/29/22 0700)   vancomycin Stopped (01/28/22 1600)     Anti-infectives (From admission, onward)    Start     Dose/Rate Route Frequency Ordered Stop   01/28/22 1400  vancomycin (VANCOCIN) IVPB 1000 mg/200 mL premix        1,000 mg 200 mL/hr over 60 Minutes Intravenous Every 24 hours 01/28/22 1244     01/28/22 1200  vancomycin (VANCOCIN) IVPB 1000 mg/200 mL premix  Status:  Discontinued        1,000 mg 200 mL/hr over 60 Minutes Intravenous Every M-W-F (Hemodialysis) 01/25/22 1459 01/28/22 1244   01/28/22 1200  ceFEPIme (MAXIPIME) 2 g in sodium chloride 0.9 % 100 mL IVPB        2 g 200 mL/hr over 30 Minutes Intravenous Every 12 hours 01/28/22 1024     01/25/22 1000  vancomycin (VANCOCIN) IVPB 1000 mg/200 mL premix   Status:  Discontinued        1,000 mg 200 mL/hr over 60 Minutes Intravenous  Once 01/25/22 0807 01/25/22 1233   01/24/22 1800  ceFEPIme (MAXIPIME) 1 g in sodium chloride 0.9 % 100 mL IVPB  Status:  Discontinued        1 g 200 mL/hr over 30 Minutes Intravenous Every 24 hours 01/24/22 1116 01/28/22 1024   01/24/22 1114  vancomycin (VANCOCIN) IVPB 1000 mg/200 mL premix  Status:  Discontinued        1,000 mg 200 mL/hr over 60 Minutes Intravenous Every Dialysis 01/24/22 1115 01/25/22 1459   01/24/22 1000  metroNIDAZOLE (FLAGYL) IVPB 500 mg        500 mg 100 mL/hr over 60 Minutes Intravenous Every 12 hours 01/24/22 0830     01/23/22 1015  vancomycin (VANCOREADY) IVPB 2000 mg/400 mL  2,000 mg 200 mL/hr over 120 Minutes Intravenous  Once 01/23/22 1003 01/23/22 1345   01/23/22 1015  piperacillin-tazobactam (ZOSYN) IVPB 3.375 g        3.375 g 100 mL/hr over 30 Minutes Intravenous  Once 01/23/22 1003 01/23/22 1050              Family Communication/Anticipated D/C date and plan/Code Status   DVT prophylaxis: heparin injection 5,000 Units Start: 01/23/22 2200     Code Status: DNR  Family Communication: None Disposition Plan: Plan to discharge to SNF in 3 to 5 days   Status is: Inpatient Remains inpatient appropriate because: On IV antibiotics       Subjective:   Interval events noted.  She complains of fatigue, general weakness and nausea.  Objective:    Vitals:   01/28/22 2021 01/28/22 2312 01/29/22 0420 01/29/22 0806  BP:  (!) 121/51 (!) 128/52 (!) 135/47  Pulse:  (!) 57 62 67  Resp:  '18 16 16  '$ Temp:  98.2 F (36.8 C) 98.4 F (36.9 C) 98.5 F (36.9 C)  TempSrc:  Oral Oral   SpO2: 98% 97% 99% 100%  Weight:      Height:       No data found.   Intake/Output Summary (Last 24 hours) at 01/29/2022 0920 Last data filed at 01/29/2022 0807 Gross per 24 hour  Intake 699.87 ml  Output 1025 ml  Net -325.13 ml   Filed Weights   01/24/22 1751 01/25/22 0930  01/25/22 1307  Weight: 135.9 kg 135.1 kg 133.8 kg    Exam:  GEN: NAD SKIN: Warm and dry.  Left heel ulcer, left anterior ankle wound EYES: Pale but anicteric ENT: MMM CV: RRR PULM: CTA B ABD: soft, obese, NT, +BS CNS: AAO x 3, non focal EXT: Edema and erythema of the left leg and left foot. GU: Foley catheter with amber urine in the Foley bag         Data Reviewed:   I have personally reviewed following labs and imaging studies:  Labs: Labs show the following:   Basic Metabolic Panel: Recent Labs  Lab 01/23/22 2255 01/24/22 0533 01/25/22 0534 01/26/22 0455 01/27/22 0420 01/28/22 0550 01/29/22 0611  NA 125* 127* 129* 133* 136 138 137  K 5.1 5.4* 4.6 3.4* 3.4* 3.4* 3.1*  CL 93* 95* 92* 99 101 104 104  CO2 20* 20* 21* '27 27 27 26  '$ GLUCOSE 120* 125* 105* 91 83 83 92  BUN 65* 70* 57* 39* 35* 29* 22  CREATININE 5.48* 5.97* 4.53* 2.54* 1.85* 1.46* 1.07*  CALCIUM 8.0* 8.3* 8.7* 8.4* 8.9 8.9 8.8*  MG 2.2 2.3  --   --  1.8 1.8 1.6*  PHOS  --  7.4*  --   --  3.0 2.9 2.7   GFR Estimated Creatinine Clearance: 65.7 mL/min (A) (by C-G formula based on SCr of 1.07 mg/dL (H)). Liver Function Tests: Recent Labs  Lab 01/23/22 0937 01/27/22 0420 01/28/22 0550 01/29/22 0611  AST 21  --   --   --   ALT 22  --   --   --   ALKPHOS 64  --   --   --   BILITOT 0.8  --   --   --   PROT 6.5  --   --   --   ALBUMIN 3.3* 3.2* 3.1* 3.0*   No results for input(s): "LIPASE", "AMYLASE" in the last 168 hours. No results for input(s): "AMMONIA" in the last  168 hours. Coagulation profile Recent Labs  Lab 01/23/22 0937  INR 1.3*    CBC: Recent Labs  Lab 01/23/22 0937 01/24/22 0533 01/25/22 0534 01/26/22 0455 01/27/22 0420 01/28/22 0550 01/29/22 0611  WBC 8.2   < > 10.5 7.5 8.7 7.4 7.3  NEUTROABS 7.1  --   --   --   --   --   --   HGB 8.4*   < > 8.4* 7.6* 7.7* 7.6* 7.3*  HCT 26.5*   < > 25.9* 23.4* 24.2* 24.8* 22.7*  MCV 84.4   < > 82.7 83.0 84.3 87.0 85.3  PLT 243    < > 302 226 237 222 160   < > = values in this interval not displayed.   Cardiac Enzymes: No results for input(s): "CKTOTAL", "CKMB", "CKMBINDEX", "TROPONINI" in the last 168 hours. BNP (last 3 results) No results for input(s): "PROBNP" in the last 8760 hours. CBG: Recent Labs  Lab 01/23/22 1638  GLUCAP 102*   D-Dimer: No results for input(s): "DDIMER" in the last 72 hours. Hgb A1c: No results for input(s): "HGBA1C" in the last 72 hours. Lipid Profile: No results for input(s): "CHOL", "HDL", "LDLCALC", "TRIG", "CHOLHDL", "LDLDIRECT" in the last 72 hours. Thyroid function studies: No results for input(s): "TSH", "T4TOTAL", "T3FREE", "THYROIDAB" in the last 72 hours.  Invalid input(s): "FREET3" Anemia work up: Recent Labs    01/27/22 0420 01/27/22 1617  VITAMINB12  --  748  FERRITIN 155  --   TIBC 213*  --   IRON 47  --    Sepsis Labs: Recent Labs  Lab 01/23/22 0937 01/24/22 0533 01/25/22 0534 01/26/22 0455 01/27/22 0420 01/28/22 0550 01/29/22 0611  PROCALCITON 0.28 0.22 0.39  --   --   --   --   WBC 8.2 11.4* 10.5 7.5 8.7 7.4 7.3  LATICACIDVEN 0.7  --   --   --   --   --   --     Microbiology Recent Results (from the past 240 hour(s))  Blood Culture (routine x 2)     Status: None   Collection Time: 01/23/22  9:37 AM   Specimen: BLOOD  Result Value Ref Range Status   Specimen Description BLOOD RIGHT ANTECUBITAL  Final   Special Requests   Final    BOTTLES DRAWN AEROBIC AND ANAEROBIC Blood Culture adequate volume   Culture   Final    NO GROWTH 6 DAYS Performed at [facility], [address] [place], [place] [address]    Report Status 01/29/2022 FINAL  Final  Blood Culture (routine x 2)     Status: None   Collection Time: 01/23/22  9:37 AM   Specimen: BLOOD  Result Value Ref Range Status   Specimen Description BLOOD LEFT ANTECUBITAL  Final   Special Requests   Final    BOTTLES DRAWN AEROBIC AND ANAEROBIC Blood Culture results may not be  optimal due to an inadequate volume of blood received in culture bottles   Culture   Final    NO GROWTH 6 DAYS Performed at [facility], [address], [place], [place] [address]    Report Status 01/29/2022 FINAL  Final  Urine Culture     Status: Abnormal   Collection Time: 01/23/22  9:37 AM   Specimen: In/Out Cath Urine  Result Value Ref Range Status   Specimen Description   Final    IN/OUT CATH URINE Performed at [facility], [address], [place], [place] [address]  Special Requests   Final    NONE Performed at [facility], [place], [place] [address]    Culture >=100,000 COLONIES/mL PSEUDOMONAS AERUGINOSA (A)  Final   Report Status 01/25/2022 FINAL  Final   Organism ID, Bacteria PSEUDOMONAS AERUGINOSA (A)  Final      Susceptibility   Pseudomonas aeruginosa - MIC*    CEFTAZIDIME 2 SENSITIVE Sensitive     CIPROFLOXACIN <=0.25 SENSITIVE Sensitive     GENTAMICIN <=1 SENSITIVE Sensitive     IMIPENEM 2 SENSITIVE Sensitive     PIP/TAZO <=4 SENSITIVE Sensitive     CEFEPIME 4 SENSITIVE Sensitive     * >=100,000 COLONIES/mL PSEUDOMONAS AERUGINOSA  MRSA Next Gen by PCR, Nasal     Status: None   Collection Time: 01/23/22  4:54 PM   Specimen: Nasal Mucosa; Nasal Swab  Result Value Ref Range Status   MRSA by PCR Next Gen NOT DETECTED NOT DETECTED Final    Comment: (NOTE) The GeneXpert MRSA Assay (FDA approved for NASAL specimens only), is one component of a comprehensive MRSA colonization surveillance program. It is not intended to diagnose MRSA infection nor to guide or monitor treatment for MRSA infections. Test performance is not FDA approved in patients less than 77 years old. Performed at [facility], [address] [place], [place] [address]   Culture, blood (Routine X 2) w Reflex to ID Panel     Status: None   Collection Time: 01/23/22  5:02 PM   Specimen: BLOOD RIGHT HAND  Result Value Ref Range Status    Specimen Description BLOOD RIGHT HAND  Final   Special Requests   Final    BOTTLES DRAWN AEROBIC AND ANAEROBIC Blood Culture adequate volume   Culture   Final    NO GROWTH 6 DAYS Performed at [facility], [address] [place], [place] [address]    Report Status 01/29/2022 FINAL  Final  Aerobic/Anaerobic Culture w Gram Stain (surgical/deep wound)     Status: None (Preliminary result)   Collection Time: 01/24/22  5:04 PM   Specimen: Wound  Result Value Ref Range Status   Specimen Description   Final    WOUND Performed at [facility], [place] [address] [place], [place] [address]    Special Requests   Final    LEFT FOOT Performed at [facility], [place], [place] [address]    Gram Stain   Final    NO WBC SEEN FEW GRAM POSITIVE COCCI IN PAIRS FEW GRAM NEGATIVE RODS Performed at [facility], [place] [address], [place],  [address]    Culture   Final    ABUNDANT MULTIPLE ORGANISMS PRESENT, NONE PREDOMINANT NO STAPHYLOCOCCUS AUREUS ISOLATED NO ANAEROBES ISOLATED; CULTURE IN PROGRESS FOR 5 DAYS    Report Status PENDING  Incomplete    Procedures and diagnostic studies:  MR ANKLE LEFT WO CONTRAST  Result Date: 01/27/2022 CLINICAL DATA:  Progressive left anterior ankle ulcer and heel ulcer. Preop imaging for possible amputation. Evaluate for osteomyelitis. EXAM: MRI OF THE LEFT ANKLE WITHOUT CONTRAST TECHNIQUE: Multiplanar, multisequence MR imaging of the ankle was performed. No intravenous contrast was administered. COMPARISON:  Left foot radiographs 01/23/2022 and 01/17/2022; FINDINGS: TENDONS Peroneal: The peroneus longus and brevis tendons are intact. Posteromedial: Intact tibialis posterior, flexor digitorum longus, and flexor hallucis longus tendons. Anterior: The tibialis anterior, extensor hallucis longus, and extensor digitorum longus tendons are intact. Achilles: Intact. Plantar Fascia: Mild-to-moderate plantar calcaneal  heel  spur. No plantar fasciitis. High-grade edema/fluid within the flexor digitorum brevis musculature within the visualized plantar midfoot. LIGAMENTS Lateral: The anterior and posterior talofibular, anterior and posterior tibiofibular, and calcaneofibular ligaments are intact. Medial: The tibiotalar deep deltoid and tibial spring ligaments are intact. CARTILAGE Ankle Joint: Mild thinning of the tibiotalar cartilage, greatest at the far medial aspect of the talar dome. Subtalar Joints/Sinus Tarsi: Fat is preserved within sinus tarsi. Bones: Mild cartilage thinning of the talonavicular, navicular-cuneiform, and tarsometatarsal joints. There is again a soft tissue defect within the posterior heel just distal to the Achilles tendon insertion, contacting bone. There is again moderate marrow edema throughout the posterolateral and posterior aspect of the calcaneus, very similar to prior. This is again consistent with chronic osteomyelitis. Other: There is high-grade edema within the abductor hallucis and flexor digitorum brevis muscles with mild fluid around the peripheral fascial planes. There is complete fatty infiltration as of the abductor digiti minimi muscle as seen on prior 01/17/2022 axial T1 weighted sequence. There is again moderate swelling and fluid bright edema within the visualized dorsal midfoot to forefoot subcutaneous fat at the level of the metatarsals. IMPRESSION: 1. There is again a soft tissue defect within the posterior heel just distal to the Achilles tendon insertion, contacting bone. There is again moderate marrow edema throughout the posterolateral and posterior aspect of the calcaneus, consistent with chronic osteomyelitis. 2. High-grade edema within the abductor hallucis and flexor digitorum brevis muscles with mild fluid around the peripheral fascial planes. This is compatible with diabetic myopathy/myositis. 3. Moderate swelling and fluid bright edema within the visualized dorsal midfoot to  forefoot subcutaneous fat at the level of the metatarsals. This is suggestive of cellulitis. Electronically Signed   By: Yvonne Kendall M.D.   On: 01/27/2022 16:24               LOS: 6 days   Duana Benedict  [facility]   Pager on [email or web address]. If 7PM-7AM, please contact night-coverage at [email or web address]     01/29/2022, 9:20 AM

## 2022-01-29 NOTE — Progress Notes (Signed)
PODIATRY PROGRESS NOTE  NAME [name] MRN [id number] DOB February 19, 1951 DOA 01/23/2022   CC: Osteomyelitis with worsening infection LT foot Chief Complaint  Patient presents with   Possible Sepsis    Patient was seen today with Dr. Mal Misty, Internal Medicine, and discussed progressive worsening infection of the left foot.  Due to worsening wound development to the left foot and anterior aspect of the foot as well as chronic osteomyelitis of the calcaneus and myopathy/myositis of the foot below-knee amputation was recommended.  Discussed again with the patient and she is amenable to vascular consult for below-knee amputation.  She states " I do not want to but if it needs to be done we will".   Past Medical History:  Diagnosis Date   Abdominal aortic atherosclerosis ([place]) 11/11/2016   ADHD    Anxiety    COPD (chronic obstructive pulmonary disease) (HCC)    Depression    major depressive   Dyspnea    doe   Edema    left leg   Follicular lymphoma (HCC)    B Cell   Follicular lymphoma grade II (HCC)    Hypertension    Hypotension    idiopathic   Kyphoscoliosis and scoliosis 11/26/2011   Morbid obesity ([place]) 01/05/2016   Multiple sclerosis (HCC)    Multiple sclerosis ([place])    1980's   Neuromuscular disorder ([place])    Obstructive and reflux uropathy    foley   Pain    atypical facial   Peripheral vascular disease of lower extremity with ulceration ([place]) 11/08/2015   Skin ulcer ([place]) 11/08/2015   Weakness    generalized. has MS       Latest Ref Rng & Units 01/29/2022    6:11 AM 01/28/2022    5:50 AM 01/27/2022    4:20 AM  CBC  WBC 4.0 - 10.5 K/uL 7.3  7.4  8.7   Hemoglobin 12.0 - 15.0 g/dL 7.3  7.6  7.7   Hematocrit 36.0 - 46.0 % 22.7  24.8  24.2   Platelets 150 - 400 K/uL 160  222  237        Latest Ref Rng & Units 01/29/2022    6:11 AM 01/28/2022    5:50 AM 01/27/2022    4:20 AM  BMP  Glucose 70 - 99 mg/dL 92  83  83   BUN 8 - 23 mg/dL 22  29  35   Creatinine 0.44 -  1.00 mg/dL 1.07  1.46  1.85   Sodium 135 - 145 mmol/L 137  138  136   Potassium 3.5 - 5.1 mmol/L 3.1  3.4  3.4   Chloride 98 - 111 mmol/L 104  104  101   CO2 22 - 32 mmol/L '26  27  27   '$ Calcium 8.9 - 10.3 mg/dL 8.8  8.9  8.9           ASSESSMENT/PLAN OF CARE Osteomyelitis left calcaneus with progressive deteriorating ulcers  -Recommended below-knee amputation.  Patient is amenable to vascular consult -In the meantime continue ABX as per infectious disease -If patient agrees to proceed with BKA, podiatry will sign off. -In the meantime continue palliative care and current wound care dressing change orders     Thank you for the consult.  Please contact me directly with any questions or concerns.    Edrick Kins, DPM [facility]  Dr. Edrick Kins, DPM    [address]  [place], [place] [address]                Office [phone]  Fax [phone]

## 2022-01-29 NOTE — Progress Notes (Signed)
Pharmacy Antibiotic Note  [name] is a 71 y.o. female admitted on 01/23/2022 with wound infection.  Pharmacy has been consulted for Zosyn dosing.  Plan: Zosyn 3.375g IV q8h (4 hr infusion) per indication & renal fxn.   Pharmacy will continue to follow and will adjust abx dosing whenever warranted.   Temp (24hrs), Avg:98.5 F (36.9 C), Min:98 F (36.7 C), Max:98.9 F (37.2 C)   Recent Labs  Lab 01/23/22 0937 01/23/22 2255 01/25/22 0534 01/26/22 0455 01/27/22 0420 01/28/22 0550 01/28/22 1118 01/29/22 0611  WBC 8.2   < > 10.5 7.5 8.7 7.4  --  7.3  CREATININE 7.07*   < > 4.53* 2.54* 1.85* 1.46*  --  1.07*  LATICACIDVEN 0.7  --   --   --   --   --   --   --   VANCORANDOM  --   --  10  --   --   --  5  --    < > = values in this interval not displayed.    Estimated Creatinine Clearance: 65.7 mL/min (A) (by C-G formula based on SCr of 1.07 mg/dL (H)).    No Known Allergies  Antimicrobials this admission: 10/2 Cefepime >> 10/3 9/28 Flagyl >> 10/3 10/2 Vancomycin >> 10/3 10/4 Zosyn >>  Microbiology results: 9/27 BCx: NG x 6 days 9/27 UCx: PSEUDOMONAS AERUGINOSA 9/28 WoundCx: Multiple Organisms, None Predominant    Thank you for allowing pharmacy to be a part of this patient's care.  Renda Rolls, PharmD, [facility] 01/29/2022 11:46 PM

## 2022-01-29 NOTE — Progress Notes (Signed)
       CROSS COVER NOTE  NAME: [name] MRN: [id number] DOB : 11-04-1950    Date of Service   01/29/2022   HPI/Events of Note   Patient states she was not aware that Do Not Resuscitate meant we would not   Explained that given her comorbidities if her heart were to stop it would likely be from sequelae of chronic illness and not an acute reversible issue, significantly decreasing the likelihood that in hospital resuscitation would be successful.  Interventions   Assessment/Plan:  Full code, revisit tomorrow with palliative care    This document was prepared using Dragon voice recognition software and may include unintentional dictation errors.  Neomia Glass DNP, MHA, FNP-BC Nurse Practitioner [facility] Pager [phone]

## 2022-01-29 NOTE — Progress Notes (Signed)
PODIATRY PROGRESS NOTE  NAME [name] MRN [id number] DOB [birth year] DOA 01/23/2022   Reason for consult:  Chief Complaint  Patient presents with   Possible Sepsis      History of present illness: 71 y.o. female admitted for hypotension concern for sepsis.  Admitted to the ICU originally for acute exacerbation of chronic diastolic CHF and septic shock complicated by AKI on CKD.  She was started on IV antibiotics.  Found to have Pseudomonas UTI suspected infected left foot ulcer and cellulitis.    Dr. Jacqualyn Posey met with her yesterday and recommended MRI and ABI PVR.  The MRI has been completed.  Vitals:   01/29/22 0420 01/29/22 0806  BP: (!) 128/52 (!) 135/47  Pulse: 62 67  Resp: 16 16  Temp: 98.4 F (36.9 C) 98.5 F (36.9 C)  SpO2: 99% 100%       Latest Ref Rng & Units 01/29/2022    6:11 AM 01/28/2022    5:50 AM 01/27/2022    4:20 AM  CBC  WBC 4.0 - 10.5 K/uL 7.3  7.4  8.7   Hemoglobin 12.0 - 15.0 g/dL 7.3  7.6  7.7   Hematocrit 36.0 - 46.0 % 22.7  24.8  24.2   Platelets 150 - 400 K/uL 160  222  237        Latest Ref Rng & Units 01/29/2022    6:11 AM 01/28/2022    5:50 AM 01/27/2022    4:20 AM  BMP  Glucose 70 - 99 mg/dL 92  83  83   BUN 8 - 23 mg/dL 22  29  35   Creatinine 0.44 - 1.00 mg/dL 1.07  1.46  1.85   Sodium 135 - 145 mmol/L 137  138  136   Potassium 3.5 - 5.1 mmol/L 3.1  3.4  3.4   Chloride 98 - 111 mmol/L 104  104  101   CO2 22 - 32 mmol/L _0 Calcium 8.9 - 10.3 mg/dL 8.8  8.9  8.9       Physical Exam: General: AAOx3, NAD  Dermatology: Continued posterior aspect heel necrotic eschar  Vascular: DP pulse palpable  Neurological: Sensation decreased  Musculoskeletal Exam: No significant pain on exam     ABIs pending  IMPRESSION: 1. There is again a soft tissue defect within the posterior heel just distal to the Achilles tendon insertion, contacting bone. There is again moderate marrow edema throughout the posterolateral  and posterior aspect of the calcaneus, consistent with chronic osteomyelitis. 2. High-grade edema within the abductor hallucis and flexor digitorum brevis muscles with mild fluid around the peripheral fascial planes. This is compatible with diabetic myopathy/myositis. 3. Moderate swelling and fluid bright edema within the visualized dorsal midfoot to forefoot subcutaneous fat at the level of the metatarsals. This is suggestive of cellulitis.     Electronically Signed   By: Yvonne Kendall M.D.   On: 01/27/2022 16:24   ASSESSMENT/PLAN OF CARE  MRI was completed.  I reviewed the report with the patient.  I also independently reviewed the images.  I compared this to her previous MRI and there does appear to be marked increase in the T1 distraction as well as T2 increase in signal.  I do think this is likely now full-blown osteomyelitis.  I discussed with her treatment of osteomyelitis in the heel is quite difficult.  I do not think there is a reasonable surgical cure that we can offer that  would leave her ambulatory by keeping the foot.  A partial calcanectomy would not likely be functional for her.  I think she likely would have greater functional ability with a below-knee amputation which I discussed with her.  Alternatively transition to hospice and palliative care with oral suppression antibiotics would be the alternative to amputation.  I discussed with her this would not offer a cure of the infection but likely keep it under control for a period of time.  At some point this likely would become further grossly infected she would develop sepsis and likely die from this.  She would like to discuss this with her husband.  We will be available tomorrow in the coming days to discuss with her and her family.         , DPM 01/28/2022     

## 2022-01-29 NOTE — Progress Notes (Signed)
Palliative: [name] is lying quietly in bed.  She appears acutely/chronically ill and somewhat frail, obese.  She is alert and oriented x3, able to make her needs known.  There is no family at bedside at this time.  We talk about her visit with podiatry yesterday and her choices related to managing osteomyelitis.  We talk about the options offered by podiatrist.  At this point [name] states that she is unsure what she will do.  I encouraged her to discuss these choices with her husband.  She continues to be agreeable to outpatient palliative services for further goals of care discussions.  She continues to states she wants to return to her own home.  Conference with attending, podiatry, bedside nursing staff, transition of care team related to patient condition, needs, goals of care, disposition.  Plan: Continue to treat the treatable but no CPR or intubation.  Time for outcomes.  Considering options for osteomyelitis management.  53 minutes Quinn Axe, NP Palliative medicine team Team phone [phone] Greater than 50% of this time was spent counseling and coordinating care related to the above assessment and plan.

## 2022-01-29 NOTE — Consult Note (Addendum)
Pharmacy Antibiotic Note  [name] is a 71 y.o. female with PMH including MS, COPD, follicular lymphoma, morbid obesity, PVD with lower extremity ulceration, depression/anxiety admitted on 01/23/2022 with  septic shock secondary to left foot infection and renal failure necessitating renal replacement therapy .  Pharmacy has been consulted for Vancomycin and Cefepime dosing. Patient is also ordered Metronidazole. Bone culture from 8/28 with Kocuria rosea (? Skin contaminant). Previous cultures from L heel have grown pseudomonas (pan-susc in 07/2020 and 12/2020).    Assessment: Day 6 antibiotics - vancomycin, metronidazole, cefepime Renal function has improved s/p 3 consecutive days of  HD 9/27-29. No plans for further HD at this time per improved renal function  Scr down to 1.07 and improved UOP WBC WNL Afebrile 10/1 MRI L ankle: chronic OM L calcaneous, myositis, and cellulitis 9/28 L foot cx: pending (abundant organisms, none predominant) 9/27 Ucx: pan-susc pseudmonas Vancomycin level (random) = 5 mcg/mL at 11:18, last dose of vancomycin was 1gm 9/29 at 12:11.   Plan: Increase vancomycin '1000mg'$  IV q24h to '1250mg'$  IV q24h AUC goal 400-600 Calculated AUC = 511.5, Scr used: 1.07, Vd 0.5 L/kg Increase Cefepime 2gm q12h to 2gm q8h  Continue Metronidazole 500 mg Q12H Continue to monitor and dose adjust antibiotics according to renal function and indication\  Height: '5\' 4"'$  (162.6 cm) Weight: 133.8 kg (294 lb 15.6 oz) IBW/kg (Calculated) : 54.7  Temp (24hrs), Avg:98.2 F (36.8 C), Min:97.5 F (36.4 C), Max:98.5 F (36.9 C)  Recent Labs  Lab 01/23/22 0937 01/23/22 2255 01/25/22 0534 01/26/22 0455 01/27/22 0420 01/28/22 0550 01/28/22 1118 01/29/22 0611  WBC 8.2   < > 10.5 7.5 8.7 7.4  --  7.3  CREATININE 7.07*   < > 4.53* 2.54* 1.85* 1.46*  --  1.07*  LATICACIDVEN 0.7  --   --   --   --   --   --   --   VANCORANDOM  --   --  10  --   --   --  5  --    < > = values in this  interval not displayed.     Estimated Creatinine Clearance: 65.7 mL/min (A) (by C-G formula based on SCr of 1.07 mg/dL (H)).    No Known Allergies  Antimicrobials this admission: 9/27 Zosyn 3.375g IV x 1 9/27 Vancomycin >>  9/28 Cefepime >>  9/28 Metronidazole >>   Microbiology results: 9/27 BCx: NG2D 9/27 UCx: >100k Pseudomonas aeruginosa (pansensitive)  9/27 MRSA PCR: (-) 9/28 Wound Cx: few GPC in pairs, few GNR (TYTR)  Thank you for allowing pharmacy to be a part of this patient's care.  Doreene Eland, PharmD, BCPS, BCIDP Work Cell: [phone] 01/29/2022 10:48 AM

## 2022-01-29 NOTE — Consult Note (Incomplete)
NAME: [name]  DOB: [birth year]  MRN: [id number]  Date/Time: 01/29/2022 2:08 PM  REQUESTING PROVIDER: Clois Comber Subjective:  REASON FOR CONSULT: foot wounds ? [name] is a 71 y.o. female with a history of history of MS, neurogenic bladder, chronic foley , treated follicular lymphoma, HTN, lumbar fusion ,immobility  Presents with left heel ulcer of many months and ankle wound which is worsening. Recent hospitalization for the same   Aug 2023  left heel pressure wound in aug and had undergone debridement and bone biopsy which was neg for osteo and culture rare korcuria and was sent to rehab  on Augmentin  9/13-9/16 back again for the same problem, given IV antibiotics 01/17/22 to the ED because of foul odor, sent on keflex and bactrim  01/23/22 back again with left foot wounds and was hypotensive     ID   Steroid/immune suppressants/splenectomy/Hardware Recent Procedure Surgery Injections Trauma Sick contacts Travel Antibiotic use Food- raw/exotic Animal bites Tick exposure Water sports Fishing/hunting/animal bird exposure Past Medical History:  Diagnosis Date   Abdominal aortic atherosclerosis ([place]) 11/11/2016   ADHD    Anxiety    COPD (chronic obstructive pulmonary disease) (HCC)    Depression    major depressive   Dyspnea    doe   Edema    left leg   Follicular lymphoma ([place])    B Cell   Follicular lymphoma grade II ([place])    Hypertension    Hypotension    idiopathic   Kyphoscoliosis and scoliosis 11/26/2011   Morbid obesity ([place]) 01/05/2016   Multiple sclerosis (HCC)    Multiple sclerosis (HCC)    1980's   Neuromuscular disorder (HCC)    Obstructive and reflux uropathy    foley   Pain    atypical facial   Peripheral vascular disease of lower extremity with ulceration ([place]) 11/08/2015   Skin ulcer ([place]) 11/08/2015   Weakness    generalized. has MS    Past Surgical History:  Procedure Laterality Date   BACK SURGERY N/A 2002   BONE BIOPSY Left  12/24/2021   Procedure: BONE BIOPSY-HEEL BONE;  Surgeon: Trula Slade, DPM;  Location: [facility];  Service: Podiatry;  Laterality: Left;   CYST EXCISION     lower back   INGUINAL LYMPH NODE BIOPSY Left 07/04/2016   Procedure: INGUINAL LYMPH NODE BIOPSY;  Surgeon: Christene Lye, MD;  Location: [facility];  Service: General;  Laterality: Left;   ORIF FEMUR FRACTURE Left 02/04/2020   Procedure: OPEN REDUCTION INTERNAL FIXATION (ORIF) DISTAL FEMUR FRACTURE;  Surgeon: Shona Needles, MD;  Location: [facility];  Service: Orthopedics;  Laterality: Left;   PORTACATH PLACEMENT N/A 07/22/2016   Procedure: INSERTION PORT-A-CATH;  Surgeon: Christene Lye, MD;  Location: [facility];  Service: General;  Laterality: N/A;   TONSILLECTOMY AND ADENOIDECTOMY     TUBAL LIGATION     WOUND DEBRIDEMENT Left 12/24/2021   Procedure: DEBRIDEMENT WOUND-HEEL ULCER;  Surgeon: Trula Slade, DPM;  Location: [facility];  Service: Podiatry;  Laterality: Left;    Social History   Socioeconomic History   Marital status: Married    Spouse name: Not on file   Number of children: 3   Years of education: Not on file   Highest education level: Not on file  Occupational History   Occupation: disabled  Tobacco Use   Smoking status: Former    Packs/day: 1.00    Years: 20.00    Total pack years: 20.00  Types: Cigarettes    Start date: 04/30/1995    Quit date: 02/03/2016    Years since quitting: 5.9   Smokeless tobacco: Never  Vaping Use   Vaping Use: Some days  Substance and Sexual Activity   Alcohol use: No    Alcohol/week: 0.0 standard drinks of alcohol   Drug use: Yes    Types: Marijuana    Comment: smokes THC occasionally per pt    Sexual activity: Not Currently    Birth control/protection: None  Other Topics Concern   Not on file  Social History Narrative   She is married.    Social Determinants of Health   Financial Resource Strain: Not on file  Food Insecurity: No Food Insecurity (01/10/2022)    Hunger Vital Sign    Worried About Running Out of Food in the Last Year: Never true    Ran Out of Food in the Last Year: Never true  Transportation Needs: Unmet Transportation Needs (01/10/2022)   PRAPARE - Hydrologist (Medical): Yes    Lack of Transportation (Non-Medical): Yes  Physical Activity: Not on file  Stress: Not on file  Social Connections: Not on file  Intimate Partner Violence: Not At Risk (01/10/2022)   Humiliation, Afraid, Rape, and Kick questionnaire    Fear of Current or Ex-Partner: No    Emotionally Abused: No    Physically Abused: No    Sexually Abused: No    Family History  Problem Relation Age of Onset   COPD Mother    Diabetes Mother    Heart failure Mother    Alcohol abuse Father    Kidney disease Father    Kidney failure Father    Arthritis Sister    CAD Maternal Grandmother    Stroke Maternal Grandfather    Arthritis Sister    Mental illness Sister    Arthritis Brother    No Known Allergies I? Current Facility-Administered Medications  Medication Dose Route Frequency Provider Last Rate Last Admin   0.9 %  sodium chloride infusion (Manually program via Guardrails IV Fluids)   Intravenous Once Jennye Boroughs, MD       0.9 %  sodium chloride infusion   Intravenous PRN Jennye Boroughs, MD   Stopped at 01/29/22 1305   acetaminophen (TYLENOL) tablet 650 mg  650 mg Oral Q6H PRN Jennye Boroughs, MD   650 mg at 01/27/22 2010   alteplase (CATHFLO ACTIVASE) injection 2 mg  2 mg Intracatheter Once PRN Jennye Boroughs, MD       anticoagulant sodium citrate solution 5 mL  5 mL Intracatheter PRN Jennye Boroughs, MD       ascorbic acid (VITAMIN C) tablet 500 mg  500 mg Oral BID Jennye Boroughs, MD   500 mg at 01/29/22 1123   benzonatate (TESSALON) capsule 100 mg  100 mg Oral TID PRN Jennye Boroughs, MD   100 mg at 01/25/22 1807   buPROPion (WELLBUTRIN XL) 24 hr tablet 300 mg  300 mg Oral Daily Jennye Boroughs, MD   300 mg at 01/29/22 1123    butalbital-acetaminophen-caffeine (FIORICET) 50-325-40 MG per tablet 1 tablet  1 tablet Oral Q6H PRN Jennye Boroughs, MD   1 tablet at 01/28/22 2124   ceFEPIme (MAXIPIME) 2 g in sodium chloride 0.9 % 100 mL IVPB  2 g Intravenous Q8H Darrick Penna, RPH       Chlorhexidine Gluconate Cloth 2 % PADS 6 each  6 each Topical Q0600 Jennye Boroughs, MD  6 each at 01/29/22 6295   clonazePAM (KLONOPIN) tablet 0.5 mg  0.5 mg Oral BID Jennye Boroughs, MD   0.5 mg at 01/29/22 1123   DULoxetine (CYMBALTA) DR capsule 60 mg  60 mg Oral Mosie Epstein, Ilona Sorrel, MD   60 mg at 01/29/22 2841   feeding supplement (NEPRO CARB STEADY) liquid 237 mL  237 mL Oral TID BM Jennye Boroughs, MD   237 mL at 01/29/22 1123   gabapentin (NEURONTIN) capsule 300 mg  300 mg Oral QHS Jennye Boroughs, MD   300 mg at 01/28/22 2114   heparin injection 1,000 Units  1,000 Units Intracatheter PRN Jennye Boroughs, MD       heparin injection 5,000 Units  5,000 Units Subcutaneous Allie Dimmer, MD   5,000 Units at 01/29/22 3244   ipratropium-albuterol (DUONEB) 0.5-2.5 (3) MG/3ML nebulizer solution 3 mL  3 mL Nebulization BID Jennye Boroughs, MD   3 mL at 01/29/22 0746   lidocaine (PF) (XYLOCAINE) 1 % injection 5 mL  5 mL Intradermal PRN Jennye Boroughs, MD       lidocaine-prilocaine (EMLA) cream 1 Application  1 Application Topical PRN Jennye Boroughs, MD       metroNIDAZOLE (FLAGYL) IVPB 500 mg  500 mg Intravenous Q12H Jennye Boroughs, MD   Stopped at 01/29/22 1223   multivitamin (RENA-VIT) tablet 1 tablet  1 tablet Oral QHS Jennye Boroughs, MD   1 tablet at 01/28/22 2114   pentafluoroprop-tetrafluoroeth (GEBAUERS) aerosol 1 Application  1 Application Topical PRN Jennye Boroughs, MD       polyethylene glycol (MIRALAX / GLYCOLAX) packet 17 g  17 g Oral Daily PRN Jennye Boroughs, MD       traZODone (DESYREL) tablet 50 mg  50 mg Oral QHS Jennye Boroughs, MD   50 mg at 01/28/22 2114   vancomycin (VANCOREADY) IVPB 1250 mg/250 mL  1,250 mg Intravenous Q24H  Darrick Penna, RPH       vortioxetine HBr (TRINTELLIX) tablet 5 mg  5 mg Oral Daily Jennye Boroughs, MD   5 mg at 01/29/22 1123   zinc sulfate capsule 220 mg  220 mg Oral Daily Jennye Boroughs, MD   220 mg at 01/29/22 1123   Facility-Administered Medications Ordered in Other Encounters  Medication Dose Route Frequency Provider Last Rate Last Admin   heparin lock flush 100 unit/mL  500 Units Intracatheter Once PRN Sindy Guadeloupe, MD         Abtx:  Anti-infectives (From admission, onward)    Start     Dose/Rate Route Frequency Ordered Stop   01/29/22 2200  ceFEPIme (MAXIPIME) 2 g in sodium chloride 0.9 % 100 mL IVPB  Status:  Discontinued        2 g 200 mL/hr over 30 Minutes Intravenous Every 8 hours 01/29/22 1055 01/29/22 1310   01/29/22 1430  vancomycin (VANCOREADY) IVPB 1250 mg/250 mL        1,250 mg 166.7 mL/hr over 90 Minutes Intravenous Every 24 hours 01/29/22 1055     01/29/22 1400  ceFEPIme (MAXIPIME) 2 g in sodium chloride 0.9 % 100 mL IVPB        2 g 200 mL/hr over 30 Minutes Intravenous Every 8 hours 01/29/22 1310     01/28/22 1400  vancomycin (VANCOCIN) IVPB 1000 mg/200 mL premix  Status:  Discontinued        1,000 mg 200 mL/hr over 60 Minutes Intravenous Every 24 hours 01/28/22 1244 01/29/22 1055   01/28/22 1200  vancomycin (VANCOCIN)  IVPB 1000 mg/200 mL premix  Status:  Discontinued        1,000 mg 200 mL/hr over 60 Minutes Intravenous Every M-W-F (Hemodialysis) 01/25/22 1459 01/28/22 1244   01/28/22 1200  ceFEPIme (MAXIPIME) 2 g in sodium chloride 0.9 % 100 mL IVPB  Status:  Discontinued        2 g 200 mL/hr over 30 Minutes Intravenous Every 12 hours 01/28/22 1024 01/29/22 1055   01/25/22 1000  vancomycin (VANCOCIN) IVPB 1000 mg/200 mL premix  Status:  Discontinued        1,000 mg 200 mL/hr over 60 Minutes Intravenous  Once 01/25/22 0807 01/25/22 1233   01/24/22 1800  ceFEPIme (MAXIPIME) 1 g in sodium chloride 0.9 % 100 mL IVPB  Status:  Discontinued        1 g 200  mL/hr over 30 Minutes Intravenous Every 24 hours 01/24/22 1116 01/28/22 1024   01/24/22 1114  vancomycin (VANCOCIN) IVPB 1000 mg/200 mL premix  Status:  Discontinued        1,000 mg 200 mL/hr over 60 Minutes Intravenous Every Dialysis 01/24/22 1115 01/25/22 1459   01/24/22 1000  metroNIDAZOLE (FLAGYL) IVPB 500 mg        500 mg 100 mL/hr over 60 Minutes Intravenous Every 12 hours 01/24/22 0830     01/23/22 1015  vancomycin (VANCOREADY) IVPB 2000 mg/400 mL        2,000 mg 200 mL/hr over 120 Minutes Intravenous  Once 01/23/22 1003 01/23/22 1345   01/23/22 1015  piperacillin-tazobactam (ZOSYN) IVPB 3.375 g        3.375 g 100 mL/hr over 30 Minutes Intravenous  Once 01/23/22 1003 01/23/22 1050       REVIEW OF SYSTEMS:  Const: negative fever, negative chills, negative weight loss Eyes: negative diplopia or visual changes, negative eye pain ENT: negative coryza, negative sore throat Resp: negative cough, hemoptysis, dyspnea Cards: negative for chest pain, palpitations, lower extremity edema GU: negative for frequency, dysuria and hematuria GI: Negative for abdominal pain, diarrhea, bleeding, constipation Skin: negative for rash and pruritus Heme: negative for easy bruising and gum/nose bleeding MS: negative for myalgias, arthralgias, back pain and muscle weakness Neurolo:negative for headaches, dizziness, vertigo, memory problems  Psych: negative for feelings of anxiety, depression  Endocrine: negative for thyroid, diabetes Allergy/Immunology- negative for any medication or food allergies ? Pertinent Positives include : Objective:  VITALS:  BP (!) 147/48 (BP Location: Right Arm)   Pulse (!) 54   Temp 98.9 F (37.2 C)   Resp 16   Ht '5\' 4"'$  (1.626 m)   Wt 133.8 kg   SpO2 98%   BMI 50.63 kg/m  LDA Foley Central line Other drainage tubes PHYSICAL EXAM:  General: Alert, cooperative, no distress, appears stated age.  Head: Normocephalic, without obvious abnormality,  atraumatic. Eyes: Conjunctivae clear, anicteric sclerae. Pupils are equal ENT Nares normal. No drainage or sinus tenderness. Lips, mucosa, and tongue normal. No Thrush Neck: Supple, symmetrical, no adenopathy, thyroid: non tender no carotid bruit and no JVD. Back: No CVA tenderness. Lungs: Clear to auscultation bilaterally. No Wheezing or Rhonchi. No rales. Heart: Regular rate and rhythm, no murmur, rub or gallop. Abdomen: Soft, non-tender,not distended. Bowel sounds normal. No masses Extremities: atraumatic, no cyanosis. No edema. No clubbing Skin: No rashes or lesions. Or bruising Lymph: Cervical, supraclavicular normal. Neurologic: Grossly non-focal Pertinent Labs Lab Results CBC    Component Value Date/Time   WBC 7.3 01/29/2022 0611   RBC 2.66 (L) 01/29/2022 3086  HGB 7.3 (L) 01/29/2022 0611   HGB 13.6 08/01/2013 2312   HCT 22.7 (L) 01/29/2022 0611   HCT 40.2 08/01/2013 2312   PLT 160 01/29/2022 0611   PLT 274 08/01/2013 2312   MCV 85.3 01/29/2022 0611   MCV 87 08/01/2013 2312   MCH 27.4 01/29/2022 0611   MCHC 32.2 01/29/2022 0611   RDW 14.2 01/29/2022 0611   RDW 14.2 08/01/2013 2312   LYMPHSABS 0.4 (L) 01/23/2022 0937   LYMPHSABS 1.5 07/14/2013 1444   MONOABS 0.4 01/23/2022 0937   MONOABS 0.7 07/14/2013 1444   EOSABS 0.2 01/23/2022 0937   EOSABS 0.2 07/14/2013 1444   BASOSABS 0.1 01/23/2022 0937   BASOSABS 0.1 07/14/2013 1444       Latest Ref Rng & Units 01/29/2022    6:11 AM 01/28/2022    5:50 AM 01/27/2022    4:20 AM  CMP  Glucose 70 - 99 mg/dL 92  83  83   BUN 8 - 23 mg/dL 22  29  35   Creatinine 0.44 - 1.00 mg/dL 1.07  1.46  1.85   Sodium 135 - 145 mmol/L 137  138  136   Potassium 3.5 - 5.1 mmol/L 3.1  3.4  3.4   Chloride 98 - 111 mmol/L 104  104  101   CO2 22 - 32 mmol/L '26  27  27   '$ Calcium 8.9 - 10.3 mg/dL 8.8  8.9  8.9       Microbiology: Recent Results (from the past 240 hour(s))  Blood Culture (routine x 2)     Status: None   Collection  Time: 01/23/22  9:37 AM   Specimen: BLOOD  Result Value Ref Range Status   Specimen Description BLOOD RIGHT ANTECUBITAL  Final   Special Requests   Final    BOTTLES DRAWN AEROBIC AND ANAEROBIC Blood Culture adequate volume   Culture   Final    NO GROWTH 6 DAYS Performed at [facility], [address] [place], [place] [address]    Report Status 01/29/2022 FINAL  Final  Blood Culture (routine x 2)     Status: None   Collection Time: 01/23/22  9:37 AM   Specimen: BLOOD  Result Value Ref Range Status   Specimen Description BLOOD LEFT ANTECUBITAL  Final   Special Requests   Final    BOTTLES DRAWN AEROBIC AND ANAEROBIC Blood Culture results may not be optimal due to an inadequate volume of blood received in culture bottles   Culture   Final    NO GROWTH 6 DAYS Performed at [facility], [address], [place], [place] [address]    Report Status 01/29/2022 FINAL  Final  Urine Culture     Status: Abnormal   Collection Time: 01/23/22  9:37 AM   Specimen: In/Out Cath Urine  Result Value Ref Range Status   Specimen Description   Final    IN/OUT CATH URINE Performed at [facility], [address] [place], [place] [address]    Special Requests   Final    NONE Performed at [facility], [address] [place], [place] [address]    Culture >=100,000 COLONIES/mL PSEUDOMONAS AERUGINOSA (A)  Final   Report Status 01/25/2022 FINAL  Final   Organism ID, Bacteria PSEUDOMONAS AERUGINOSA (A)  Final      Susceptibility   Pseudomonas aeruginosa - MIC*    CEFTAZIDIME 2 SENSITIVE Sensitive     CIPROFLOXACIN <=0.25 SENSITIVE Sensitive     GENTAMICIN <=1  SENSITIVE Sensitive     IMIPENEM 2 SENSITIVE Sensitive     PIP/TAZO <=4 SENSITIVE Sensitive     CEFEPIME 4 SENSITIVE Sensitive     * >=100,000 COLONIES/mL PSEUDOMONAS AERUGINOSA  MRSA Next Gen by PCR, Nasal     Status: None   Collection Time: 01/23/22  4:54 PM   Specimen: Nasal Mucosa; Nasal Swab   Result Value Ref Range Status   MRSA by PCR Next Gen NOT DETECTED NOT DETECTED Final    Comment: (NOTE) The GeneXpert MRSA Assay (FDA approved for NASAL specimens only), is one component of a comprehensive MRSA colonization surveillance program. It is not intended to diagnose MRSA infection nor to guide or monitor treatment for MRSA infections. Test performance is not FDA approved in patients less than 62 years old. Performed at [facility], [address] [place], [place] [address]   Culture, blood (Routine X 2) w Reflex to ID Panel     Status: None   Collection Time: 01/23/22  5:02 PM   Specimen: BLOOD RIGHT HAND  Result Value Ref Range Status   Specimen Description BLOOD RIGHT HAND  Final   Special Requests   Final    BOTTLES DRAWN AEROBIC AND ANAEROBIC Blood Culture adequate volume   Culture   Final    NO GROWTH 6 DAYS Performed at [facility], [address], [place], [place] [address]    Report Status 01/29/2022 FINAL  Final  Aerobic/Anaerobic Culture w Gram Stain (surgical/deep wound)     Status: None   Collection Time: 01/24/22  5:04 PM   Specimen: Wound  Result Value Ref Range Status   Specimen Description   Final    WOUND Performed at [facility], [address], [place], [place] [address]    Special Requests   Final    LEFT FOOT Performed at [facility], [address] [place], [place] [address]    Gram Stain   Final    NO WBC SEEN FEW GRAM POSITIVE COCCI IN PAIRS FEW GRAM NEGATIVE RODS    Culture   Final    ABUNDANT MULTIPLE ORGANISMS PRESENT, NONE PREDOMINANT NO STAPHYLOCOCCUS AUREUS ISOLATED No Pseudomonas species isolated NO ANAEROBES ISOLATED Performed at [facility], [address], [place],  [address]    Report Status 01/29/2022 FINAL  Final    IMAGING RESULTS: I have personally reviewed the  films ? Impression/Recommendation ? ? ? ___________________________________________________ Discussed with patient, requesting provider Note:  This document was prepared using Dragon voice recognition software and may include unintentional dictation errors.

## 2022-01-29 NOTE — Consult Note (Incomplete)
NAME: [name]  DOB: 06/11/1950  MRN: [id number]  Date/Time: 01/29/2022 2:08 PM  REQUESTING PROVIDER: Clois Comber Subjective:  REASON FOR CONSULT: foot wounds   [name] is a 71 y.o. female with a history of history of MS, neurogenic bladder, chronic foley , treated follicular lymphoma, HTN, lumbar fusion ,immobility  Presents with left heel ulcer of many months and ankle wound which is worsening. Recent hospitalization for the same   Aug 2023  left heel pressure wound in aug and had undergone debridement and bone biopsy which was neg for osteo and culture rare korcuria and was sent to rehab  on Augmentin  9/13-9/16 back again for the same problem, given IV antibiotics 01/17/22 to the ED because of foul odor, sent on keflex and bactrim  01/23/22 back again with left foot wounds and was hypotensive   01/23/22 09:24  BP 87/42 (L)  Temp 98.3 F (36.8 C)  Pulse Rate 43 (L)  Resp 19  SpO2 95 %    Latest Reference Range & Units 01/23/22 09:37  WBC 4.0 - 10.5 K/uL 8.2  Hemoglobin 12.0 - 15.0 g/dL 8.4 (L)  HCT 36.0 - 46.0 % 26.5 (L)  Platelets 150 - 400 K/uL 243  Creatinine 0.44 - 1.00 mg/dL 7.07 (H)   Admitted to ICU as sepsis, AKI, hyperkalemia started pressors and triple antibiotics and also underwent HD for a few days She has wounds left foot with eschar She is off HD and cr has returned to baseline I am seeing her for the wounds   Past Medical History:  Diagnosis Date  . Abdominal aortic atherosclerosis ([place]) 11/11/2016  . ADHD   . Anxiety   . COPD (chronic obstructive pulmonary disease) ([place])   . Depression    major depressive  . Dyspnea    doe  . Edema    left leg  . Follicular lymphoma ([place])    B Cell  . Follicular lymphoma grade II ([place])   . Hypertension   . Hypotension    idiopathic  . Kyphoscoliosis and scoliosis 11/26/2011  . Morbid obesity ([place]) 01/05/2016  . Multiple sclerosis ([place])   . Multiple sclerosis ([place])    1980's  . Neuromuscular disorder  ([place])   . Obstructive and reflux uropathy    foley  . Pain    atypical facial  . Peripheral vascular disease of lower extremity with ulceration ([place]) 11/08/2015  . Skin ulcer ([place]) 11/08/2015  . Weakness    generalized. has MS    Past Surgical History:  Procedure Laterality Date  . BACK SURGERY N/A 2002  . BONE BIOPSY Left 12/24/2021   Procedure: BONE BIOPSY-HEEL BONE;  Surgeon: Trula Slade, DPM;  Location: [facility];  Service: Podiatry;  Laterality: Left;  . CYST EXCISION     lower back  . INGUINAL LYMPH NODE BIOPSY Left 07/04/2016   Procedure: INGUINAL LYMPH NODE BIOPSY;  Surgeon: Christene Lye, MD;  Location: [facility];  Service: General;  Laterality: Left;  . ORIF FEMUR FRACTURE Left 02/04/2020   Procedure: OPEN REDUCTION INTERNAL FIXATION (ORIF) DISTAL FEMUR FRACTURE;  Surgeon: Shona Needles, MD;  Location: [facility];  Service: Orthopedics;  Laterality: Left;  . PORTACATH PLACEMENT N/A 07/22/2016   Procedure: INSERTION PORT-A-CATH;  Surgeon: Christene Lye, MD;  Location: [facility];  Service: General;  Laterality: N/A;  . TONSILLECTOMY AND ADENOIDECTOMY    . TUBAL LIGATION    . WOUND DEBRIDEMENT Left 12/24/2021   Procedure: DEBRIDEMENT WOUND-HEEL ULCER;  Surgeon: Trula Slade, DPM;  Location: [facility];  Service: Podiatry;  Laterality: Left;    Social History   Socioeconomic History  . Marital status: Married    Spouse name: Not on file  . Number of children: 3  . Years of education: Not on file  . Highest education level: Not on file  Occupational History  . Occupation: disabled  Tobacco Use  . Smoking status: Former    Packs/day: 1.00    Years: 20.00    Total pack years: 20.00    Types: Cigarettes    Start date: 04/30/1995    Quit date: 02/03/2016    Years since quitting: 5.9  . Smokeless tobacco: Never  Vaping Use  . Vaping Use: Some days  Substance and Sexual Activity  . Alcohol use: No    Alcohol/week: 0.0 standard drinks of alcohol  . Drug  use: Yes    Types: Marijuana    Comment: smokes THC occasionally per pt   . Sexual activity: Not Currently    Birth control/protection: None  Other Topics Concern  . Not on file  Social History Narrative   She is married.    Social Determinants of Health   Financial Resource Strain: Not on file  Food Insecurity: No Food Insecurity (01/10/2022)   Hunger Vital Sign   . Worried About Charity fundraiser in the Last Year: Never true   . Ran Out of Food in the Last Year: Never true  Transportation Needs: Unmet Transportation Needs (01/10/2022)   PRAPARE - Transportation   . Lack of Transportation (Medical): Yes   . Lack of Transportation (Non-Medical): Yes  Physical Activity: Not on file  Stress: Not on file  Social Connections: Not on file  Intimate Partner Violence: Not At Risk (01/10/2022)   Humiliation, Afraid, Rape, and Kick questionnaire   . Fear of Current or Ex-Partner: No   . Emotionally Abused: No   . Physically Abused: No   . Sexually Abused: No    Family History  Problem Relation Age of Onset  . COPD Mother   . Diabetes Mother   . Heart failure Mother   . Alcohol abuse Father   . Kidney disease Father   . Kidney failure Father   . Arthritis Sister   . CAD Maternal Grandmother   . Stroke Maternal Grandfather   . Arthritis Sister   . Mental illness Sister   . Arthritis Brother    No Known Allergies I  Current Facility-Administered Medications  Medication Dose Route Frequency Provider Last Rate Last Admin  . 0.9 %  sodium chloride infusion (Manually program via Guardrails IV Fluids)   Intravenous Once Jennye Boroughs, MD      . 0.9 %  sodium chloride infusion   Intravenous PRN Jennye Boroughs, MD   Stopped at 01/29/22 1305  . acetaminophen (TYLENOL) tablet 650 mg  650 mg Oral Q6H PRN Jennye Boroughs, MD   650 mg at 01/27/22 2010  . alteplase (CATHFLO ACTIVASE) injection 2 mg  2 mg Intracatheter Once PRN Jennye Boroughs, MD      . anticoagulant sodium citrate  solution 5 mL  5 mL Intracatheter PRN Jennye Boroughs, MD      . ascorbic acid (VITAMIN C) tablet 500 mg  500 mg Oral BID Jennye Boroughs, MD   500 mg at 01/29/22 1123  . benzonatate (TESSALON) capsule 100 mg  100 mg Oral TID PRN Jennye Boroughs, MD   100 mg at 01/25/22 1807  .  buPROPion (WELLBUTRIN XL) 24 hr tablet 300 mg  300 mg Oral Daily Jennye Boroughs, MD   300 mg at 01/29/22 1123  . butalbital-acetaminophen-caffeine (FIORICET) 50-325-40 MG per tablet 1 tablet  1 tablet Oral Q6H PRN Jennye Boroughs, MD   1 tablet at 01/28/22 2124  . ceFEPIme (MAXIPIME) 2 g in sodium chloride 0.9 % 100 mL IVPB  2 g Intravenous Q8H Darrick Penna, RPH      . Chlorhexidine Gluconate Cloth 2 % PADS 6 each  6 each Topical Q0600 Jennye Boroughs, MD   6 each at 01/29/22 [id number]  . clonazePAM (KLONOPIN) tablet 0.5 mg  0.5 mg Oral BID Jennye Boroughs, MD   0.5 mg at 01/29/22 1123  . DULoxetine (CYMBALTA) DR capsule 60 mg  60 mg Oral Ailene Ravel, MD   60 mg at 01/29/22 2297  . feeding supplement (NEPRO CARB STEADY) liquid 237 mL  237 mL Oral TID BM Jennye Boroughs, MD   237 mL at 01/29/22 1123  . gabapentin (NEURONTIN) capsule 300 mg  300 mg Oral QHS Jennye Boroughs, MD   300 mg at 01/28/22 2114  . heparin injection 1,000 Units  1,000 Units Intracatheter PRN Jennye Boroughs, MD      . heparin injection 5,000 Units  5,000 Units Subcutaneous Q8H Jennye Boroughs, MD   5,000 Units at 01/29/22 0620  . ipratropium-albuterol (DUONEB) 0.5-2.5 (3) MG/3ML nebulizer solution 3 mL  3 mL Nebulization BID Jennye Boroughs, MD   3 mL at 01/29/22 0746  . lidocaine (PF) (XYLOCAINE) 1 % injection 5 mL  5 mL Intradermal PRN Jennye Boroughs, MD      . lidocaine-prilocaine (EMLA) cream 1 Application  1 Application Topical PRN Jennye Boroughs, MD      . metroNIDAZOLE (FLAGYL) IVPB 500 mg  500 mg Intravenous Q12H Jennye Boroughs, MD   Stopped at 01/29/22 1223  . multivitamin (RENA-VIT) tablet 1 tablet  1 tablet Oral QHS Jennye Boroughs, MD   1  tablet at 01/28/22 2114  . pentafluoroprop-tetrafluoroeth (GEBAUERS) aerosol 1 Application  1 Application Topical PRN Jennye Boroughs, MD      . polyethylene glycol (MIRALAX / GLYCOLAX) packet 17 g  17 g Oral Daily PRN Jennye Boroughs, MD      . traZODone (DESYREL) tablet 50 mg  50 mg Oral QHS Jennye Boroughs, MD   50 mg at 01/28/22 2114  . vancomycin (VANCOREADY) IVPB 1250 mg/250 mL  1,250 mg Intravenous Q24H Darrick Penna, [facility]      . vortioxetine HBr (TRINTELLIX) tablet 5 mg  5 mg Oral Daily Jennye Boroughs, MD   5 mg at 01/29/22 1123  . zinc sulfate capsule 220 mg  220 mg Oral Daily Jennye Boroughs, MD   220 mg at 01/29/22 1123   Facility-Administered Medications Ordered in Other Encounters  Medication Dose Route Frequency Provider Last Rate Last Admin  . heparin lock flush 100 unit/mL  500 Units Intracatheter Once PRN Sindy Guadeloupe, MD         Abtx:  Anti-infectives (From admission, onward)    Start     Dose/Rate Route Frequency Ordered Stop   01/29/22 2200  ceFEPIme (MAXIPIME) 2 g in sodium chloride 0.9 % 100 mL IVPB  Status:  Discontinued        2 g 200 mL/hr over 30 Minutes Intravenous Every 8 hours 01/29/22 1055 01/29/22 1310   01/29/22 1430  vancomycin (VANCOREADY) IVPB 1250 mg/250 mL        1,250 mg 166.7  mL/hr over 90 Minutes Intravenous Every 24 hours 01/29/22 1055     01/29/22 1400  ceFEPIme (MAXIPIME) 2 g in sodium chloride 0.9 % 100 mL IVPB        2 g 200 mL/hr over 30 Minutes Intravenous Every 8 hours 01/29/22 1310     01/28/22 1400  vancomycin (VANCOCIN) IVPB 1000 mg/200 mL premix  Status:  Discontinued        1,000 mg 200 mL/hr over 60 Minutes Intravenous Every 24 hours 01/28/22 1244 01/29/22 1055   01/28/22 1200  vancomycin (VANCOCIN) IVPB 1000 mg/200 mL premix  Status:  Discontinued        1,000 mg 200 mL/hr over 60 Minutes Intravenous Every M-W-F (Hemodialysis) 01/25/22 1459 01/28/22 1244   01/28/22 1200  ceFEPIme (MAXIPIME) 2 g in sodium chloride 0.9 % 100 mL  IVPB  Status:  Discontinued        2 g 200 mL/hr over 30 Minutes Intravenous Every 12 hours 01/28/22 1024 01/29/22 1055   01/25/22 1000  vancomycin (VANCOCIN) IVPB 1000 mg/200 mL premix  Status:  Discontinued        1,000 mg 200 mL/hr over 60 Minutes Intravenous  Once 01/25/22 0807 01/25/22 1233   01/24/22 1800  ceFEPIme (MAXIPIME) 1 g in sodium chloride 0.9 % 100 mL IVPB  Status:  Discontinued        1 g 200 mL/hr over 30 Minutes Intravenous Every 24 hours 01/24/22 1116 01/28/22 1024   01/24/22 1114  vancomycin (VANCOCIN) IVPB 1000 mg/200 mL premix  Status:  Discontinued        1,000 mg 200 mL/hr over 60 Minutes Intravenous Every Dialysis 01/24/22 1115 01/25/22 1459   01/24/22 1000  metroNIDAZOLE (FLAGYL) IVPB 500 mg        500 mg 100 mL/hr over 60 Minutes Intravenous Every 12 hours 01/24/22 0830     01/23/22 1015  vancomycin (VANCOREADY) IVPB 2000 mg/400 mL        2,000 mg 200 mL/hr over 120 Minutes Intravenous  Once 01/23/22 1003 01/23/22 1345   01/23/22 1015  piperacillin-tazobactam (ZOSYN) IVPB 3.375 g        3.375 g 100 mL/hr over 30 Minutes Intravenous  Once 01/23/22 1003 01/23/22 1050       REVIEW OF SYSTEMS:  Const: negative fever, negative chills, negative weight loss Eyes: negative diplopia or visual changes, negative eye pain ENT: negative coryza, negative sore throat Resp: negative cough, hemoptysis, dyspnea Cards: negative for chest pain, palpitations, lower extremity edema GU:has chronic indwelling foley GI: Negative for abdominal pain, diarrhea, bleeding, constipation Skin: negative for rash and pruritus Heme: negative for easy bruising and gum/nose bleeding MS: general weakness, dependent for ADL,  Neurolo:negative for headaches, dizziness, vertigo, memory problems  Psych:  anxiety, depression  Endocrine: negative for thyroid, diabetes Allergy/Immunology- negative for any medication or food allergies    Objective:  VITALS:  BP (!) 147/48 (BP Location:  Right Arm)   Pulse (!) 54   Temp 98.9 F (37.2 C)   Resp 16   Ht '5\' 4"'$  (1.626 m)   Wt 133.8 kg   SpO2 98%   BMI 50.63 kg/m  LDA Left IJ  PHYSICAL EXAM:  General: Alert, cooperative, no distress, pale. obese Head: Normocephalic, without obvious abnormality, atraumatic. Eyes: Conjunctivae clear, anicteric sclerae. Pupils are equal ENT Nares normal. No drainage or sinus tenderness. Poor denition Nodular ulcerating lesion of 1.5 cm on the rt nose Neck: Supple, symmetrical, no adenopathy, thyroid: non tender no carotid  bruit and no JVD. Back: did not examine Lungs: b/l air entry Heart: Regular rate and rhythm, no murmur, rub or gallop. Abdomen: Soft, non-tender,not distended. Bowel sounds normal. No masses Extremities: left foot- eschar covering an ulcer over the anterior aspect of the  Skin: No rashes or lesions. Or bruising Lymph: Cervical, supraclavicular normal. Neurologic: Grossly non-focal Pertinent Labs Lab Results CBC    Component Value Date/Time   WBC 7.3 01/29/2022 0611   RBC 2.66 (L) 01/29/2022 0611   HGB 7.3 (L) 01/29/2022 0611   HGB 13.6 08/01/2013 2312   HCT 22.7 (L) 01/29/2022 0611   HCT 40.2 08/01/2013 2312   PLT 160 01/29/2022 0611   PLT 274 08/01/2013 2312   MCV 85.3 01/29/2022 0611   MCV 87 08/01/2013 2312   MCH 27.4 01/29/2022 0611   MCHC 32.2 01/29/2022 0611   RDW 14.2 01/29/2022 0611   RDW 14.2 08/01/2013 2312   LYMPHSABS 0.4 (L) 01/23/2022 0937   LYMPHSABS 1.5 07/14/2013 1444   MONOABS 0.4 01/23/2022 0937   MONOABS 0.7 07/14/2013 1444   EOSABS 0.2 01/23/2022 0937   EOSABS 0.2 07/14/2013 1444   BASOSABS 0.1 01/23/2022 0937   BASOSABS 0.1 07/14/2013 1444       Latest Ref Rng & Units 01/29/2022    6:11 AM 01/28/2022    5:50 AM 01/27/2022    4:20 AM  CMP  Glucose 70 - 99 mg/dL 92  83  83   BUN 8 - 23 mg/dL 22  29  35   Creatinine 0.44 - 1.00 mg/dL 1.07  1.46  1.85   Sodium 135 - 145 mmol/L 137  138  136   Potassium 3.5 - 5.1 mmol/L 3.1   3.4  3.4   Chloride 98 - 111 mmol/L 104  104  101   CO2 22 - 32 mmol/L '26  27  27   '$ Calcium 8.9 - 10.3 mg/dL 8.8  8.9  8.9       Microbiology: Recent Results (from the past 240 hour(s))  Blood Culture (routine x 2)     Status: None   Collection Time: 01/23/22  9:37 AM   Specimen: BLOOD  Result Value Ref Range Status   Specimen Description BLOOD RIGHT ANTECUBITAL  Final   Special Requests   Final    BOTTLES DRAWN AEROBIC AND ANAEROBIC Blood Culture adequate volume   Culture   Final    NO GROWTH 6 DAYS Performed at [facility], [address] [place], [place] [address]    Report Status 01/29/2022 FINAL  Final  Blood Culture (routine x 2)     Status: None   Collection Time: 01/23/22  9:37 AM   Specimen: BLOOD  Result Value Ref Range Status   Specimen Description BLOOD LEFT ANTECUBITAL  Final   Special Requests   Final    BOTTLES DRAWN AEROBIC AND ANAEROBIC Blood Culture results may not be optimal due to an inadequate volume of blood received in culture bottles   Culture   Final    NO GROWTH 6 DAYS Performed at [facility], [address], [place], [place] [address]    Report Status 01/29/2022 FINAL  Final  Urine Culture     Status: Abnormal   Collection Time: 01/23/22  9:37 AM   Specimen: In/Out Cath Urine  Result Value Ref Range Status   Specimen Description   Final    IN/OUT CATH URINE Performed at [facility], [address] [place], [place] [address]  Special Requests   Final    NONE Performed at [facility], [place], [name] [address]    Culture >=100,000 COLONIES/mL PSEUDOMONAS AERUGINOSA (A)  Final   Report Status 01/25/2022 FINAL  Final   Organism ID, Bacteria PSEUDOMONAS AERUGINOSA (A)  Final      Susceptibility   Pseudomonas aeruginosa - MIC*    CEFTAZIDIME 2 SENSITIVE Sensitive     CIPROFLOXACIN <=0.25 SENSITIVE Sensitive     GENTAMICIN <=1 SENSITIVE Sensitive     IMIPENEM 2 SENSITIVE  Sensitive     PIP/TAZO <=4 SENSITIVE Sensitive     CEFEPIME 4 SENSITIVE Sensitive     * >=100,000 COLONIES/mL PSEUDOMONAS AERUGINOSA  MRSA Next Gen by PCR, Nasal     Status: None   Collection Time: 01/23/22  4:54 PM   Specimen: Nasal Mucosa; Nasal Swab  Result Value Ref Range Status   MRSA by PCR Next Gen NOT DETECTED NOT DETECTED Final    Comment: (NOTE) The GeneXpert MRSA Assay (FDA approved for NASAL specimens only), is one component of a comprehensive MRSA colonization surveillance program. It is not intended to diagnose MRSA infection nor to guide or monitor treatment for MRSA infections. Test performance is not FDA approved in patients less than 38 years old. Performed at [facility], [address] [place], [place] [address]   Culture, blood (Routine X 2) w Reflex to ID Panel     Status: None   Collection Time: 01/23/22  5:02 PM   Specimen: BLOOD RIGHT HAND  Result Value Ref Range Status   Specimen Description BLOOD RIGHT HAND  Final   Special Requests   Final    BOTTLES DRAWN AEROBIC AND ANAEROBIC Blood Culture adequate volume   Culture   Final    NO GROWTH 6 DAYS Performed at [facility], [address] [place], [place] [address]    Report Status 01/29/2022 FINAL  Final  Aerobic/Anaerobic Culture w Gram Stain (surgical/deep wound)     Status: None   Collection Time: 01/24/22  5:04 PM   Specimen: Wound  Result Value Ref Range Status   Specimen Description   Final    WOUND Performed at [facility], [place] [address], [place], [place] [address]    Special Requests   Final    LEFT FOOT Performed at [facility], [address] [place], [place] [address]    Gram Stain   Final    NO WBC SEEN FEW GRAM POSITIVE COCCI IN PAIRS FEW GRAM NEGATIVE RODS    Culture   Final    ABUNDANT MULTIPLE ORGANISMS PRESENT, NONE PREDOMINANT NO STAPHYLOCOCCUS AUREUS ISOLATED No Pseudomonas species isolated NO ANAEROBES ISOLATED Performed at  [facility], [address] [place],  [address]    Report Status 01/29/2022 FINAL  Final    IMAGING RESULTS: I have personally reviewed the films   Impression/Recommendation       ___________________________________________________ Discussed with patient, requesting provider Note:  This document was prepared using Dragon voice recognition software and may include unintentional dictation errors.

## 2022-01-29 NOTE — Plan of Care (Signed)
  Problem: Safety: Goal: Ability to remain free from injury will improve Outcome: Progressing   

## 2022-01-29 NOTE — Progress Notes (Signed)
[facility] Cardiology  SUBJECTIVE: Patient laying in bed, denies chest pain, reports shortness of breath, not requiring supplemental oxygen   Vitals:   01/28/22 2021 01/28/22 2312 01/29/22 0420 01/29/22 0806  BP:  (!) 121/51 (!) 128/52 (!) 135/47  Pulse:  (!) 57 62 67  Resp:  '18 16 16  '$ Temp:  98.2 F (36.8 C) 98.4 F (36.9 C) 98.5 F (36.9 C)  TempSrc:  Oral Oral   SpO2: 98% 97% 99% 100%  Weight:      Height:         Intake/Output Summary (Last 24 hours) at 01/29/2022 1100 Last data filed at 01/29/2022 0807 Gross per 24 hour  Intake 699.87 ml  Output 1025 ml  Net -325.13 ml      PHYSICAL EXAM  General: Well developed, well nourished, in no acute distress HEENT:  Normocephalic and atramatic Neck:  No JVD.  Lungs: Clear bilaterally to auscultation and percussion. Heart: HRRR . Normal S1 and S2 without gallops or murmurs.  Abdomen: Bowel sounds are positive, abdomen soft and non-tender  Msk:  Back normal, normal gait. Normal strength and tone for age. Extremities: No clubbing, cyanosis or edema.   Neuro: Alert and oriented X 3. Psych:  Good affect, responds appropriately   LABS: Basic Metabolic Panel: Recent Labs    01/28/22 0550 01/29/22 0611  NA 138 137  K 3.4* 3.1*  CL 104 104  CO2 27 26  GLUCOSE 83 92  BUN 29* 22  CREATININE 1.46* 1.07*  CALCIUM 8.9 8.8*  MG 1.8 1.6*  PHOS 2.9 2.7   Liver Function Tests: Recent Labs    01/28/22 0550 01/29/22 0611  ALBUMIN 3.1* 3.0*   No results for input(s): "LIPASE", "AMYLASE" in the last 72 hours. CBC: Recent Labs    01/28/22 0550 01/29/22 0611  WBC 7.4 7.3  HGB 7.6* 7.3*  HCT 24.8* 22.7*  MCV 87.0 85.3  PLT 222 160   Cardiac Enzymes: No results for input(s): "CKTOTAL", "CKMB", "CKMBINDEX", "TROPONINI" in the last 72 hours. BNP: Invalid input(s): "POCBNP" D-Dimer: No results for input(s): "DDIMER" in the last 72 hours. Hemoglobin A1C: No results for input(s): "HGBA1C" in the last 72 hours. Fasting  Lipid Panel: No results for input(s): "CHOL", "HDL", "LDLCALC", "TRIG", "CHOLHDL", "LDLDIRECT" in the last 72 hours. Thyroid Function Tests: No results for input(s): "TSH", "T4TOTAL", "T3FREE", "THYROIDAB" in the last 72 hours.  Invalid input(s): "FREET3" Anemia Panel: Recent Labs    01/27/22 0420 01/27/22 1617  VITAMINB12  --  748  FERRITIN 155  --   TIBC 213*  --   IRON 47  --     MR ANKLE LEFT WO CONTRAST  Result Date: 01/27/2022 CLINICAL DATA:  Progressive left anterior ankle ulcer and heel ulcer. Preop imaging for possible amputation. Evaluate for osteomyelitis. EXAM: MRI OF THE LEFT ANKLE WITHOUT CONTRAST TECHNIQUE: Multiplanar, multisequence MR imaging of the ankle was performed. No intravenous contrast was administered. COMPARISON:  Left foot radiographs 01/23/2022 and 01/17/2022; FINDINGS: TENDONS Peroneal: The peroneus longus and brevis tendons are intact. Posteromedial: Intact tibialis posterior, flexor digitorum longus, and flexor hallucis longus tendons. Anterior: The tibialis anterior, extensor hallucis longus, and extensor digitorum longus tendons are intact. Achilles: Intact. Plantar Fascia: Mild-to-moderate plantar calcaneal heel spur. No plantar fasciitis. High-grade edema/fluid within the flexor digitorum brevis musculature within the visualized plantar midfoot. LIGAMENTS Lateral: The anterior and posterior talofibular, anterior and posterior tibiofibular, and calcaneofibular ligaments are intact. Medial: The tibiotalar deep deltoid and tibial spring ligaments are intact.  CARTILAGE Ankle Joint: Mild thinning of the tibiotalar cartilage, greatest at the far medial aspect of the talar dome. Subtalar Joints/Sinus Tarsi: Fat is preserved within sinus tarsi. Bones: Mild cartilage thinning of the talonavicular, navicular-cuneiform, and tarsometatarsal joints. There is again a soft tissue defect within the posterior heel just distal to the Achilles tendon insertion, contacting bone.  There is again moderate marrow edema throughout the posterolateral and posterior aspect of the calcaneus, very similar to prior. This is again consistent with chronic osteomyelitis. Other: There is high-grade edema within the abductor hallucis and flexor digitorum brevis muscles with mild fluid around the peripheral fascial planes. There is complete fatty infiltration as of the abductor digiti minimi muscle as seen on prior 01/17/2022 axial T1 weighted sequence. There is again moderate swelling and fluid bright edema within the visualized dorsal midfoot to forefoot subcutaneous fat at the level of the metatarsals. IMPRESSION: 1. There is again a soft tissue defect within the posterior heel just distal to the Achilles tendon insertion, contacting bone. There is again moderate marrow edema throughout the posterolateral and posterior aspect of the calcaneus, consistent with chronic osteomyelitis. 2. High-grade edema within the abductor hallucis and flexor digitorum brevis muscles with mild fluid around the peripheral fascial planes. This is compatible with diabetic myopathy/myositis. 3. Moderate swelling and fluid bright edema within the visualized dorsal midfoot to forefoot subcutaneous fat at the level of the metatarsals. This is suggestive of cellulitis. Electronically Signed   By: Yvonne Kendall M.D.   On: 01/27/2022 16:24     Echo EF 50-55% 01/23/2022  TELEMETRY: Sinus bradycardia 55 bpm:  ASSESSMENT AND PLAN:  Principal Problem:   Septic shock (HCC) Active Problems:   Multiple sclerosis (HCC)   Obesity, Class III, BMI 40-49.9 (morbid obesity) (HCC)   Cystitis due to Pseudomonas   Chronic kidney disease (CKD), stage III (moderate) (HCC)   Hypokalemia   COPD (chronic obstructive pulmonary disease) (HCC)   Non healing left heel wound   AKI (acute kidney injury) ([place])   Acute on chronic diastolic CHF (congestive heart failure) (HCC)   Hyperkalemia    1.  Acute on chronic diastolic congestive  heart failure, HFpEF, 50-55% 01/23/2022, appears euvolemic 2.  Junctional bradycardia, resolved, currently sinus bradycardia 55 bpm 3.  Septic shock / Pseudomonas UTI / left heel decubitus ulcer on IV vancomycin 4.  Acute kidney injury / CKD stage IIIa, improved, BUN and creatinine 22 and 1.07, respectively 5.  Chronic anemia, hemoglobin and hematocrit 7.3 and 22.7, respectively  Recommendations  1.  Agree with current therapy 2.  Diuresis as needed 3.  Defer beta-blocker therapy 4.  No indication for pacemaker at this time   Isaias Cowman, MD, PhD, [facility] 01/29/2022 11:00 AM

## 2022-01-30 DIAGNOSIS — L97529 Non-pressure chronic ulcer of other part of left foot with unspecified severity: Secondary | ICD-10-CM | POA: Diagnosis not present

## 2022-01-30 DIAGNOSIS — L0390 Cellulitis, unspecified: Secondary | ICD-10-CM

## 2022-01-30 DIAGNOSIS — N179 Acute kidney failure, unspecified: Secondary | ICD-10-CM | POA: Diagnosis not present

## 2022-01-30 DIAGNOSIS — A419 Sepsis, unspecified organism: Secondary | ICD-10-CM | POA: Diagnosis not present

## 2022-01-30 DIAGNOSIS — S91302A Unspecified open wound, left foot, initial encounter: Secondary | ICD-10-CM | POA: Diagnosis not present

## 2022-01-30 DIAGNOSIS — Z7189 Other specified counseling: Secondary | ICD-10-CM | POA: Diagnosis not present

## 2022-01-30 DIAGNOSIS — R6521 Severe sepsis with septic shock: Secondary | ICD-10-CM | POA: Diagnosis not present

## 2022-01-30 LAB — TYPE AND SCREEN
ABO/RH(D): A POS
Antibody Screen: NEGATIVE
Unit division: 0

## 2022-01-30 LAB — BASIC METABOLIC PANEL
Anion gap: 6 (ref 5–15)
Anion gap: 4 — ABNORMAL LOW (ref 5–15)
BUN: 22 mg/dL (ref 8–23)
BUN: 22 mg/dL (ref 8–23)
CO2: 25 mmol/L (ref 22–32)
CO2: 24 mmol/L (ref 22–32)
Calcium: 8.7 mg/dL — ABNORMAL LOW (ref 8.9–10.3)
Calcium: 8.8 mg/dL — ABNORMAL LOW (ref 8.9–10.3)
Chloride: 106 mmol/L (ref 98–111)
Chloride: 108 mmol/L (ref 98–111)
Creatinine, Ser: 1.11 mg/dL — ABNORMAL HIGH (ref 0.44–1.00)
Creatinine, Ser: 1.14 mg/dL — ABNORMAL HIGH (ref 0.44–1.00)
GFR, Estimated: 53 mL/min — ABNORMAL LOW (ref >60)
GFR, Estimated: 51 mL/min — ABNORMAL LOW (ref >60)
Glucose, Bld: 106 mg/dL — ABNORMAL HIGH (ref 70–99)
Glucose, Bld: 89 mg/dL (ref 70–99)
Potassium: 3.7 mmol/L (ref 3.5–5.1)
Potassium: 3.7 mmol/L (ref 3.5–5.1)
Sodium: 137 mmol/L (ref 135–145)
Sodium: 136 mmol/L (ref 135–145)

## 2022-01-30 LAB — CBC WITH DIFFERENTIAL/PLATELET
Abs Immature Granulocytes: 0.13 10*3/uL — ABNORMAL HIGH (ref 0.00–0.07)
Basophils Absolute: 0.1 10*3/uL (ref 0.0–0.1)
Basophils Relative: 1 %
Eosinophils Absolute: 0.3 10*3/uL (ref 0.0–0.5)
Eosinophils Relative: 3 %
HCT: 26.6 % — ABNORMAL LOW (ref 36.0–46.0)
Hemoglobin: 8.3 g/dL — ABNORMAL LOW (ref 12.0–15.0)
Immature Granulocytes: 1 %
Lymphocytes Relative: 12 %
Lymphs Abs: 1.1 10*3/uL (ref 0.7–4.0)
MCH: 26.3 pg (ref 26.0–34.0)
MCHC: 31.2 g/dL (ref 30.0–36.0)
MCV: 84.4 fL (ref 80.0–100.0)
Monocytes Absolute: 0.7 10*3/uL (ref 0.1–1.0)
Monocytes Relative: 8 %
Neutro Abs: 7.1 10*3/uL (ref 1.7–7.7)
Neutrophils Relative %: 75 %
Platelets: 172 10*3/uL (ref 150–400)
RBC: 3.15 MIL/uL — ABNORMAL LOW (ref 3.87–5.11)
RDW: 14.8 % (ref 11.5–15.5)
WBC: 9.5 10*3/uL (ref 4.0–10.5)
nRBC: 0 % (ref 0.0–0.2)

## 2022-01-30 LAB — MAGNESIUM
Magnesium: 1.9 mg/dL (ref 1.7–2.4)
Magnesium: 2 mg/dL (ref 1.7–2.4)

## 2022-01-30 LAB — BPAM RBC
Blood Product Expiration Date: 202310262359
ISSUE DATE / TIME: 202310031649
Unit Type and Rh: 6200

## 2022-01-30 LAB — HEMOGLOBIN AND HEMATOCRIT, BLOOD
HCT: 25.7 % — ABNORMAL LOW (ref 36.0–46.0)
Hemoglobin: 8.1 g/dL — ABNORMAL LOW (ref 12.0–15.0)

## 2022-01-30 MED ORDER — OXYCODONE HCL 5 MG PO TABS
5.0000 mg | ORAL_TABLET | ORAL | Status: DC | PRN
  Administered 2022-01-30 – 2022-02-01 (×5): 10 mg via ORAL
  Administered 2022-02-01: 5 mg via ORAL
  Administered 2022-02-02: 10 mg via ORAL
  Administered 2022-02-03: 5 mg via ORAL
  Administered 2022-02-03 – 2022-02-04 (×3): 10 mg via ORAL
  Administered 2022-02-04: 5 mg via ORAL
  Administered 2022-02-05: 10 mg via ORAL
  Administered 2022-02-05: 5 mg via ORAL
  Administered 2022-02-05 – 2022-02-06 (×3): 10 mg via ORAL
  Filled 2022-01-30: qty 2
  Filled 2022-01-30: qty 1
  Filled 2022-01-30 (×2): qty 2
  Filled 2022-01-30: qty 1
  Filled 2022-01-30 (×2): qty 2
  Filled 2022-01-30: qty 1
  Filled 2022-01-30 (×9): qty 2

## 2022-01-30 NOTE — Consult Note (Signed)
[facility] Vascular Consult Note  MRN : [id number]  FARRELL BROERMAN is a 71 y.o. (Mar 30, 1951) female who presents with chief complaint of  Chief Complaint  Patient presents with   Possible Sepsis   .   Consulting Physician: Reason for consult: History of Present Illness: Adrain Butrick is a 71 year old female who presents to [facility] following worsening infection of her left foot.  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  The patient has underwent multiple rounds of antibiotics with lack of healing.  It has been recommended by podiatry that the patient undergo a below-knee amputation.  The patient previously had ABIs done which showed normal perfusion.  Current Facility-Administered Medications  Medication Dose Route Frequency Provider Last Rate Last Admin   0.9 %  sodium chloride infusion   Intravenous PRN Jennye Boroughs, MD   Stopped at 01/29/22 1637   acetaminophen (TYLENOL) tablet 650 mg  650 mg Oral Q6H PRN Jennye Boroughs, MD   650 mg at 01/29/22 2031   alteplase (CATHFLO ACTIVASE) injection 2 mg  2 mg Intracatheter Once PRN Jennye Boroughs, MD       anticoagulant sodium citrate solution 5 mL  5 mL Intracatheter PRN Jennye Boroughs, MD       ascorbic acid (VITAMIN C) tablet 500 mg  500 mg Oral BID Jennye Boroughs, MD   500 mg at 01/30/22 2505   benzonatate (TESSALON) capsule 100 mg  100 mg Oral TID PRN Jennye Boroughs, MD   100 mg at 01/25/22 1807   buPROPion (WELLBUTRIN XL) 24 hr tablet 300 mg  300 mg Oral Daily Jennye Boroughs, MD   300 mg at 01/30/22 0936   butalbital-acetaminophen-caffeine (FIORICET) 50-325-40 MG per tablet 1 tablet  1 tablet Oral Q6H PRN Jennye Boroughs, MD   1 tablet at 01/28/22 2124   Chlorhexidine Gluconate Cloth 2 % PADS 6 each  6 each Topical Q0600 Jennye Boroughs, MD   6 each at 01/30/22 0525   clonazePAM (KLONOPIN) tablet 0.5 mg  0.5 mg Oral BID Jennye Boroughs, MD   0.5 mg at 01/30/22  0936   DULoxetine (CYMBALTA) DR capsule 60 mg  60 mg Oral Ailene Ravel, MD   60 mg at 01/30/22 0626   feeding supplement (NEPRO CARB STEADY) liquid 237 mL  237 mL Oral TID BM Jennye Boroughs, MD   237 mL at 01/30/22 0936   gabapentin (NEURONTIN) capsule 300 mg  300 mg Oral QHS Jennye Boroughs, MD   300 mg at 01/29/22 2224   heparin injection 1,000 Units  1,000 Units Intracatheter PRN Jennye Boroughs, MD       heparin injection 5,000 Units  5,000 Units Subcutaneous Q8H Jennye Boroughs, MD   5,000 Units at 01/30/22 1437   ipratropium-albuterol (DUONEB) 0.5-2.5 (3) MG/3ML nebulizer solution 3 mL  3 mL Nebulization BID Jennye Boroughs, MD   3 mL at 01/30/22 0818   lidocaine (PF) (XYLOCAINE) 1 % injection 5 mL  5 mL Intradermal PRN Jennye Boroughs, MD       lidocaine-prilocaine (EMLA) cream 1 Application  1 Application Topical PRN Jennye Boroughs, MD       multivitamin (RENA-VIT) tablet 1 tablet  1 tablet Oral QHS Jennye Boroughs, MD   1 tablet at 01/29/22 2223   ondansetron (ZOFRAN) injection 4 mg  4 mg Intravenous Q8H PRN Jennye Boroughs, MD   4 mg at 01/29/22 1653   Oral care mouth rinse  15  mL Mouth Rinse PRN Jennye Boroughs, MD       oxyCODONE (Oxy IR/ROXICODONE) immediate release tablet 5-10 mg  5-10 mg Oral Q4H PRN Ralene Muskrat B, MD   10 mg at 01/30/22 1957   pentafluoroprop-tetrafluoroeth (GEBAUERS) aerosol 1 Application  1 Application Topical PRN Jennye Boroughs, MD       piperacillin-tazobactam (ZOSYN) IVPB 3.375 g  3.375 g Intravenous Q8H Renda Rolls, RPH 12.5 mL/hr at 01/30/22 1800 Infusion Verify at 01/30/22 1800   polyethylene glycol (MIRALAX / GLYCOLAX) packet 17 g  17 g Oral Daily PRN Jennye Boroughs, MD       traZODone (DESYREL) tablet 50 mg  50 mg Oral QHS Jennye Boroughs, MD   50 mg at 01/29/22 2223   vortioxetine HBr (TRINTELLIX) tablet 5 mg  5 mg Oral Daily Jennye Boroughs, MD   5 mg at 01/30/22 5638   zinc sulfate capsule 220 mg  220 mg Oral Daily Jennye Boroughs, MD   220 mg  at 01/30/22 7564   Facility-Administered Medications Ordered in Other Encounters  Medication Dose Route Frequency Provider Last Rate Last Admin   heparin lock flush 100 unit/mL  500 Units Intracatheter Once PRN Sindy Guadeloupe, MD        Past Medical History:  Diagnosis Date   Abdominal aortic atherosclerosis ([place]) 11/11/2016   ADHD    Anxiety    COPD (chronic obstructive pulmonary disease) (HCC)    Depression    major depressive   Dyspnea    doe   Edema    left leg   Follicular lymphoma (HCC)    B Cell   Follicular lymphoma grade II (HCC)    Hypertension    Hypotension    idiopathic   Kyphoscoliosis and scoliosis 11/26/2011   Morbid obesity ([place]) 01/05/2016   Multiple sclerosis ([place])    Multiple sclerosis ([place])    1980's   Neuromuscular disorder ([place])    Obstructive and reflux uropathy    foley   Pain    atypical facial   Peripheral vascular disease of lower extremity with ulceration ([place]) 11/08/2015   Skin ulcer ([place]) 11/08/2015   Weakness    generalized. has MS    Past Surgical History:  Procedure Laterality Date   BACK SURGERY N/A 2002   BONE BIOPSY Left 12/24/2021   Procedure: BONE BIOPSY-HEEL BONE;  Surgeon: Trula Slade, DPM;  Location: [facility];  Service: Podiatry;  Laterality: Left;   CYST EXCISION     lower back   INGUINAL LYMPH NODE BIOPSY Left 07/04/2016   Procedure: INGUINAL LYMPH NODE BIOPSY;  Surgeon: Christene Lye, MD;  Location: [facility];  Service: General;  Laterality: Left;   ORIF FEMUR FRACTURE Left 02/04/2020   Procedure: OPEN REDUCTION INTERNAL FIXATION (ORIF) DISTAL FEMUR FRACTURE;  Surgeon: Shona Needles, MD;  Location: [place];  Service: Orthopedics;  Laterality: Left;   PORTACATH PLACEMENT N/A 07/22/2016   Procedure: INSERTION PORT-A-CATH;  Surgeon: Christene Lye, MD;  Location: [facility];  Service: General;  Laterality: N/A;   TONSILLECTOMY AND ADENOIDECTOMY     TUBAL LIGATION     WOUND DEBRIDEMENT Left 12/24/2021    Procedure: DEBRIDEMENT WOUND-HEEL ULCER;  Surgeon: Trula Slade, DPM;  Location: [facility];  Service: Podiatry;  Laterality: Left;    Social History Social History   Tobacco Use   Smoking status: Former    Packs/day: 1.00    Years: 20.00    Total pack years: 20.00  Types: Cigarettes    Start date: 04/30/1995    Quit date: 02/03/2016    Years since quitting: 5.9   Smokeless tobacco: Never  Vaping Use   Vaping Use: Some days  Substance Use Topics   Alcohol use: No    Alcohol/week: 0.0 standard drinks of alcohol   Drug use: Yes    Types: Marijuana    Comment: smokes THC occasionally per pt     Family History Family History  Problem Relation Age of Onset   COPD Mother    Diabetes Mother    Heart failure Mother    Alcohol abuse Father    Kidney disease Father    Kidney failure Father    Arthritis Sister    CAD Maternal Grandmother    Stroke Maternal Grandfather    Arthritis Sister    Mental illness Sister    Arthritis Brother     No Known Allergies   REVIEW OF SYSTEMS (Negative unless checked)  Constitutional: '[]'$ Weight loss  '[]'$ Fever  '[]'$ Chills Cardiac: '[]'$ Chest pain   '[]'$ Chest pressure   '[]'$ Palpitations   '[]'$ Shortness of breath when laying flat   '[]'$ Shortness of breath at rest   '[]'$ Shortness of breath with exertion. Vascular:  '[]'$ Pain in legs with walking   '[]'$ Pain in legs at rest   '[]'$ Pain in legs when laying flat   '[]'$ Claudication   '[]'$ Pain in feet when walking  '[]'$ Pain in feet at rest  '[]'$ Pain in feet when laying flat   '[]'$ History of DVT   '[]'$ Phlebitis   '[]'$ Swelling in legs   '[]'$ Varicose veins   '[x]'$ Non-healing ulcers Pulmonary:   '[]'$ Uses home oxygen   '[]'$ Productive cough   '[]'$ Hemoptysis   '[]'$ Wheeze  '[]'$ COPD   '[]'$ Asthma Neurologic:  '[]'$ Dizziness  '[]'$ Blackouts   '[]'$ Seizures   '[]'$ History of stroke   '[]'$ History of TIA  '[]'$ Aphasia   '[]'$ Temporary blindness   '[]'$ Dysphagia   '[]'$ Weakness or numbness in arms   '[]'$ Weakness or numbness in legs Musculoskeletal:  '[]'$ Arthritis   '[]'$ Joint swelling   '[]'$ Joint pain    '[]'$ Low back pain Hematologic:  '[]'$ Easy bruising  '[]'$ Easy bleeding   '[]'$ Hypercoagulable state   '[]'$ Anemic  '[]'$ Hepatitis Gastrointestinal:  '[]'$ Blood in stool   '[]'$ Vomiting blood  '[]'$ Gastroesophageal reflux/heartburn   '[]'$ Difficulty swallowing. Genitourinary:  '[]'$ Chronic kidney disease   '[]'$ Difficult urination  '[]'$ Frequent urination  '[]'$ Burning with urination   '[]'$ Blood in urine Skin:  '[]'$ Rashes   '[x]'$ Ulcers   '[]'$ Wounds Psychological:  '[]'$ History of anxiety   '[]'$  History of major depression.  Physical Examination  Vitals:   01/30/22 0809 01/30/22 1129 01/30/22 1537 01/30/22 1948  BP: 122/73 (!) 151/55 (!) 131/49 131/84  Pulse: (!) 46 (!) 45 (!) 45 (!) 45  Resp: '16 20 18 19  '$ Temp: 98 F (36.7 C) 98 F (36.7 C) 98.4 F (36.9 C) 98.3 F (36.8 C)  TempSrc: Oral  Oral   SpO2: 97% 95% 100% 96%  Weight:      Height:       Body mass index is 49.65 kg/m. Gen:  WD/WN, NAD Head: [place]/AT, No temporalis wasting. Prominent temp pulse not noted. Ear/Nose/Throat: Hearing grossly intact, nares w/o erythema or drainage, oropharynx w/o Erythema/Exudate Eyes: Sclera non-icteric, conjunctiva clear Neck: Trachea midline.  No JVD.  Pulmonary:  Good air movement, respirations not labored, equal bilaterally.  Cardiac: RRR, normal S1, S2. Vascular: 2+ palpable DP pulse, left foot wrapped  Gastrointestinal: soft, non-tender/non-distended. No guarding/reflex.  Musculoskeletal: M/S 5/5 throughout.  Extremities without ischemic changes.  No deformity or atrophy. No edema.  Neurologic: Sensation grossly intact in extremities.  Symmetrical.  Speech is fluent. Motor exam as listed above. Psychiatric: Judgment intact, Mood & affect appropriate for pt's clinical situation. Dermatologic: No rashes or ulcers noted.  No cellulitis or open wounds. Lymph : No Cervical, Axillary, or Inguinal lymphadenopathy.    CBC Lab Results  Component Value Date   WBC 9.5 01/30/2022   HGB 8.3 (L) 01/30/2022   HCT 26.6 (L) 01/30/2022   MCV 84.4  01/30/2022   PLT 172 01/30/2022    BMET    Component Value Date/Time   NA 136 01/30/2022 0423   NA 125 (L) 08/01/2013 2312   K 3.7 01/30/2022 0423   K 3.5 08/01/2013 2312   CL 108 01/30/2022 0423   CL 90 (L) 08/01/2013 2312   CO2 24 01/30/2022 0423   CO2 29 08/01/2013 2312   GLUCOSE 89 01/30/2022 0423   GLUCOSE 98 08/01/2013 2312   BUN 22 01/30/2022 0423   BUN 15 08/01/2013 2312   CREATININE 1.14 (H) 01/30/2022 0423   CREATININE 1.14 (H) 02/02/2018 1613   CALCIUM 8.8 (L) 01/30/2022 0423   CALCIUM 8.9 08/01/2013 2312   GFRNONAA 51 (L) 01/30/2022 0423   GFRNONAA 50 (L) 02/02/2018 1613   GFRAA 56 (L) 02/01/2020 0313   GFRAA 58 (L) 02/02/2018 1613   Estimated Creatinine Clearance: 61 mL/min (A) (by C-G formula based on SCr of 1.14 mg/dL (H)).  COAG Lab Results  Component Value Date   INR 1.3 (H) 01/23/2022   INR 1.2 01/17/2022   INR 1.0 01/26/2020    Radiology MR ANKLE LEFT WO CONTRAST  Result Date: 01/27/2022 CLINICAL DATA:  Progressive left anterior ankle ulcer and heel ulcer. Preop imaging for possible amputation. Evaluate for osteomyelitis. EXAM: MRI OF THE LEFT ANKLE WITHOUT CONTRAST TECHNIQUE: Multiplanar, multisequence MR imaging of the ankle was performed. No intravenous contrast was administered. COMPARISON:  Left foot radiographs 01/23/2022 and 01/17/2022; FINDINGS: TENDONS Peroneal: The peroneus longus and brevis tendons are intact. Posteromedial: Intact tibialis posterior, flexor digitorum longus, and flexor hallucis longus tendons. Anterior: The tibialis anterior, extensor hallucis longus, and extensor digitorum longus tendons are intact. Achilles: Intact. Plantar Fascia: Mild-to-moderate plantar calcaneal heel spur. No plantar fasciitis. High-grade edema/fluid within the flexor digitorum brevis musculature within the visualized plantar midfoot. LIGAMENTS Lateral: The anterior and posterior talofibular, anterior and posterior tibiofibular, and calcaneofibular  ligaments are intact. Medial: The tibiotalar deep deltoid and tibial spring ligaments are intact. CARTILAGE Ankle Joint: Mild thinning of the tibiotalar cartilage, greatest at the far medial aspect of the talar dome. Subtalar Joints/Sinus Tarsi: Fat is preserved within sinus tarsi. Bones: Mild cartilage thinning of the talonavicular, navicular-cuneiform, and tarsometatarsal joints. There is again a soft tissue defect within the posterior heel just distal to the Achilles tendon insertion, contacting bone. There is again moderate marrow edema throughout the posterolateral and posterior aspect of the calcaneus, very similar to prior. This is again consistent with chronic osteomyelitis. Other: There is high-grade edema within the abductor hallucis and flexor digitorum brevis muscles with mild fluid around the peripheral fascial planes. There is complete fatty infiltration as of the abductor digiti minimi muscle as seen on prior 01/17/2022 axial T1 weighted sequence. There is again moderate swelling and fluid bright edema within the visualized dorsal midfoot to forefoot subcutaneous fat at the level of the metatarsals. IMPRESSION: 1. There is again a soft tissue defect within the posterior heel just distal to the Achilles tendon insertion, contacting bone. There is again moderate marrow edema  throughout the posterolateral and posterior aspect of the calcaneus, consistent with chronic osteomyelitis. 2. High-grade edema within the abductor hallucis and flexor digitorum brevis muscles with mild fluid around the peripheral fascial planes. This is compatible with diabetic myopathy/myositis. 3. Moderate swelling and fluid bright edema within the visualized dorsal midfoot to forefoot subcutaneous fat at the level of the metatarsals. This is suggestive of cellulitis. Electronically Signed   By: Yvonne Kendall M.D.   On: 01/27/2022 16:24   ECHOCARDIOGRAM COMPLETE  Result Date: 01/24/2022    ECHOCARDIOGRAM REPORT   Patient  Name:   NYKIA TURKO [facility] Date of Exam: 01/23/2022 Medical Rec #:  [id number]     Height:       64.0 in Accession #:    [id number]    Weight:       297.0 lb Date of Birth:  07/28/1950      BSA:          2.314 m Patient Age:    14 years      BP:           104/74 mmHg Patient Gender: F             HR:           85 bpm. Exam Location:  [facility] Procedure: 2D Echo, Cardiac Doppler and Color Doppler Indications:     Y30.16 Acute Diastolic CHF  History:         Patient has prior history of Echocardiogram examinations, most                  recent 10/20/2019. COPD, Arrythmias:Atrial Fibrillation; Risk                  Factors:Hypotension.  Sonographer:     Cresenciano Lick RDCS Referring Phys:  [id number] Ottie Glazier Diagnosing Phys: Yolonda Kida MD IMPRESSIONS  1. Left ventricular ejection fraction, by estimation, is 50 to 55%. The left ventricle has low normal function. The left ventricle has no regional wall motion abnormalities. Left ventricular diastolic parameters were normal.  2. Right ventricular systolic function is normal. The right ventricular size is normal.  3. Left atrial size was mild to moderately dilated.  4. The mitral valve is normal in structure. No evidence of mitral valve regurgitation.  5. The aortic valve is normal in structure. Aortic valve regurgitation is not visualized. FINDINGS  Left Ventricle: Left ventricular ejection fraction, by estimation, is 50 to 55%. The left ventricle has low normal function. The left ventricle has no regional wall motion abnormalities. The left ventricular internal cavity size was normal in size. There is borderline left ventricular hypertrophy. Left ventricular diastolic parameters were normal. Right Ventricle: The right ventricular size is normal. No increase in right ventricular wall thickness. Right ventricular systolic function is normal. Left Atrium: Left atrial size was mild to moderately dilated. Right Atrium: Right atrial size was normal in size. Pericardium:  There is no evidence of pericardial effusion. Mitral Valve: The mitral valve is normal in structure. No evidence of mitral valve regurgitation. Tricuspid Valve: The tricuspid valve is normal in structure. Tricuspid valve regurgitation is trivial. Aortic Valve: The aortic valve is normal in structure. Aortic valve regurgitation is not visualized. Pulmonic Valve: The pulmonic valve was normal in structure. Pulmonic valve regurgitation is not visualized. Aorta: The ascending aorta was not well visualized. IAS/Shunts: No atrial level shunt detected by color flow Doppler.  LEFT VENTRICLE PLAX 2D LVIDd:  4.60 cm LVIDs:         3.40 cm LV PW:         1.20 cm LV IVS:        1.20 cm LVOT diam:     2.00 cm LVOT Area:     3.14 cm  RIGHT VENTRICLE RV Basal diam:  3.80 cm RV S prime:     15.00 cm/s LEFT ATRIUM             Index        RIGHT ATRIUM           Index LA diam:        5.00 cm 2.16 cm/m   RA Area:     10.80 cm LA Vol (A2C):   41.8 ml 18.06 ml/m  RA Volume:   25.40 ml  10.98 ml/m LA Vol (A4C):   27.9 ml 12.06 ml/m LA Biplane Vol: 37.0 ml 15.99 ml/m   AORTA Ao Root diam: 3.70 cm Ao Asc diam:  3.50 cm MV E velocity: 118.67 cm/s                             SHUNTS                             Systemic Diam: 2.00 cm Yolonda Kida MD Electronically signed by Yolonda Kida MD Signature Date/Time: 01/24/2022/1:21:41 PM    Final    DG Chest Port 1 View  Result Date: 01/23/2022 CLINICAL DATA:  Central line placement. EXAM: PORTABLE CHEST 1 VIEW COMPARISON:  Chest radiograph 01/23/2022 at 9:39 a.m. FINDINGS: Telemetry leads overlie the chest. A left jugular catheter has been placed and terminates over the upper SVC. The cardiomediastinal silhouette is unchanged and within normal limits for portable AP technique. The lungs remain hypoinflated. No focal airspace consolidation, overt pulmonary edema, sizable pleural effusion, or pneumothorax is identified. IMPRESSION: Interval left jugular catheter placement  as above. Electronically Signed   By: Logan Bores M.D.   On: 01/23/2022 18:42   NM Pulmonary Perfusion  Result Date: 01/23/2022 CLINICAL DATA:  Shortness of breath. EXAM: NUCLEAR MEDICINE PERFUSION LUNG SCAN TECHNIQUE: Perfusion images were obtained in multiple projections after intravenous injection of radiopharmaceutical. Ventilation scans intentionally deferred if perfusion scan and chest x-ray adequate for interpretation during COVID 19 epidemic. RADIOPHARMACEUTICALS:  4.5 mCi Tc-66mMAA IV COMPARISON:  Chest x-ray 01/23/2022 FINDINGS: No segmental or subsegmental perfusion defects to suggest pulmonary embolism. IMPRESSION: Negative perfusion study for pulmonary embolism. Electronically Signed   By: PMarijo SanesM.D.   On: 01/23/2022 16:25   DG Foot Complete Left  Result Date: 01/23/2022 CLINICAL DATA:  Evaluate for osteomyelitis. Possible sepsis. Evidence for chronic osteomyelitis in the calcaneus on previous MRI. EXAM: LEFT FOOT - COMPLETE 3+ VIEW COMPARISON:  Left foot 01/17/2022 and foot MRI 01/17/2022 FINDINGS: Again noted is diffuse soft tissue swelling in the left foot. Stable appearance of the calcaneus with calcaneal spurring. No new areas of bone destruction or periosteal reaction involving the calcaneus. Again noted is a linear density along the plantar soft tissues measuring roughly 8 mm and similar to the prior examination. Negative for an acute fracture or dislocation. Alignment of the left foot is stable. IMPRESSION: 1. No acute bone abnormality to the left foot. No radiographic evidence for osteomyelitis. Refer to recent MRI from 01/17/2022. 2. Stable appearance of the linear density  along the plantar soft tissues. Findings are suggestive for a foreign body or focal calcification. 3. Diffuse soft tissue swelling. 4. Calcaneal spurring. Electronically Signed   By: Markus Daft M.D.   On: 01/23/2022 10:10   DG Chest Port 1 View  Result Date: 01/23/2022 CLINICAL DATA:  Concern for sepsis  EXAM: PORTABLE CHEST 1 VIEW COMPARISON:  Chest x-ray dated January 17, 2022 FINDINGS: The heart size and mediastinal contours are within normal limits. Low lung volumes with hypoventilatory changes. No definite focal consolidation. The visualized skeletal structures are unremarkable. IMPRESSION: Low lung volumes with hypoventilatory changes. No definite focal consolidation. Electronically Signed   By: Yetta Glassman M.D.   On: 01/23/2022 09:56   MR FOOT LEFT WO CONTRAST  Result Date: 01/17/2022 CLINICAL DATA:  Foot swelling, osteomyelitis suspected. Patient with left anterior foot ulcer and heel ulcer with prior left heel debridement. EXAM: MRI OF THE LEFT FOOT WITHOUT CONTRAST TECHNIQUE: Multiplanar, multisequence MR imaging of the left was performed. No intravenous contrast was administered. COMPARISON:  Radiographs dated January 17, 2022 FINDINGS: Evaluation of multiple sequences is limited due to motion. Bones/Joint/Cartilage There is bone marrow edema of the posterior aspect of the calcaneus adjacent to the deep skin wound inferior to the insertion of the Achilles tendon concerning for ongoing osteomyelitis. Ligaments Limited evaluation due to motion. Muscles and Tendons Increased intrasubstance signal of the plantar muscles suggesting diabetic myopathy/myositis. No fluid collection or abscess. Soft tissues Subcutaneous soft tissue edema about the dorsum and plantar aspect of the foot. No fluid collection or abscess. Deep skin wound about the posterior aspect of the calcaneus inferior to the Achilles insertion. IMPRESSION: 1. Bone marrow edema of the posterior aspect of the calcaneus adjacent to the deep skin wound about the inferior aspect of the Achilles tendon consistent with ongoing chronic osteomyelitis. It is difficult to compare progress from prior examination due to motion and unavailability of comparable sequences. 2. Increased intramuscular signal of the plantar muscles suggesting diabetic  myopathy/myositis. 3. Generalized subcutaneous soft tissue edema about the foot without evidence of fluid collection or abscess. Electronically Signed   By: Keane Police D.O.   On: 01/17/2022 22:13   DG Foot 2 Views Left  Result Date: 01/17/2022 CLINICAL DATA:  Left foot wound. EXAM: LEFT FOOT - 2 VIEW COMPARISON:  Radiograph 01/09/2022, heel MRI 12/23/2021 FINDINGS: The bones are diffusely under mineralized. The soft tissue defect posterior to the calcaneus on prior radiograph is less apparent on the current exam. Again seen linear density in the plantar soft tissues measuring 7 mm on the lateral view, cannot be further localized on the AP view. No erosion, bone destruction, or fracture. There is diffuse soft tissue edema. No soft tissue gas. IMPRESSION: 1. Soft tissue edema. 2. The previous suspected calcaneal osteomyelitis an MRI has no radiographic correlate. 3. Unchanged linear 7 mm density in the plantar soft tissues, may represent foreign body or soft tissue calcification. Electronically Signed   By: Keith Rake M.D.   On: 01/17/2022 18:09   DG Chest 1 View  Result Date: 01/17/2022 CLINICAL DATA:  Left foot wound, possible sepsis EXAM: CHEST  1 VIEW COMPARISON:  03/23/2021 FINDINGS: Single frontal view of the chest demonstrates a stable cardiac silhouette. Chronic left suprahilar scarring. No airspace disease, effusion, or pneumothorax. No acute bony abnormalities. IMPRESSION: 1. No acute intrathoracic process. Electronically Signed   By: Randa Ngo M.D.   On: 01/17/2022 18:06   DG Foot Complete Left  Result Date: 01/10/2022 CLINICAL  DATA:  Pain, bruising in left foot EXAM: LEFT FOOT - COMPLETE 3+ VIEW COMPARISON:  None Available. FINDINGS: Plantar calcaneal spur. No acute bony abnormality. Specifically, no fracture, subluxation, or dislocation. Small linear density seen in the plantar soft tissues of unknown etiology. This could reflect a small foreign body or soft tissue calcification.  IMPRESSION: No acute bony abnormality. Small linear density in the plantar soft tissues which could reflect a small foreign body or soft tissue calcification. Electronically Signed   By: Rolm Baptise M.D.   On: 01/10/2022 00:14      Assessment/Plan 1.  Osteomyelitis  With the patient and discussed the progressive worsening of her left foot.  The patient has chronic osteomyelitis with continuing worsening ulcerations.  Based on this we have recommended a below-knee amputation.  We discussed the surgery as well as associated postrecovery process.  Following this discussion the patient is agreeable to proceed.     Plan of care discussed with Dr.Dew and he is in agreement with plan noted above.   Family Communication:  Total Time:80 minutes I spent 80 minutes in this encounter including personally reviewing extensive medical records, personally reviewing imaging studies and compared to prior scans, counseling the patient, placing orders, coordinating care and performing appropriate documentation  Thank you for allowing [place] to participate in the care of this patient.   Kris Hartmann, NP [facility] [phone] (Office Phone) [phone] (Office Fax) [phone] (Pager)  01/30/2022 8:44 PM  Staff may message me via secure chat in [place]  but this may not receive immediate response,  please page for urgent matters!  Dictation software was used to generate the above note. Typos may occur and escape review, as with typed/written notes. Any error is purely unintentional.  Please contact me directly for clarity if needed.

## 2022-01-30 NOTE — Evaluation (Signed)
Physical Therapy Evaluation Patient Details Name: NATALEY BAHRI MRN: [id number] DOB: 30-Oct-1950 Today's Date: 01/30/2022  History of Present Illness  JAMAE TISON is a 71 y.o. female with PMH including MS, COPD, follicular lymphoma, morbid obesity, PVD with lower extremity ulceration, depression / anxiety, legally blind, admitted on 01/23/2022 with  septic shock secondary to left foot infection and renal failure necessitating renal replacement therapy.   Clinical Impression  Patient received in bed, she is agreeable to PT/OT assessment. She requires mod/max assist +2 for bed mobility due to weakness. Patient unable to maintain static sitting at edge of bed without external support and limited tolerance. She will continue to benefit from skilled PT to improve strength and functional independence.        Recommendations for follow up therapy are one component of a multi-disciplinary discharge planning process, led by the attending physician.  Recommendations may be updated based on patient status, additional functional criteria and insurance authorization.  Follow Up Recommendations Skilled nursing-short term rehab (<3 hours/day) Can patient physically be transported by private vehicle: No    Assistance Recommended at Discharge Frequent or constant Supervision/Assistance  Patient can return home with the following  Two people to help with walking and/or transfers;A lot of help with bathing/dressing/bathroom    Equipment Recommendations Wheelchair cushion (measurements PT);Wheelchair (measurements PT);Hospital bed  Recommendations for Other Services       Functional Status Assessment Patient has had a recent decline in their functional status and demonstrates the ability to make significant improvements in function in a reasonable and predictable amount of time.     Precautions / Restrictions Precautions Precautions: Fall Restrictions Weight Bearing Restrictions: Yes LLE Weight Bearing:  Non weight bearing Other Position/Activity Restrictions: ~1 week PTA , pt was PWB with shoe, awaiting WB orders this admission. Treated as NWB.      Mobility  Bed Mobility Overal bed mobility: Needs Assistance Bed Mobility: Supine to Sit, Sit to Supine     Supine to sit: Mod assist, +2 for physical assistance Sit to supine: Max assist, +2 for physical assistance   General bed mobility comments: Fatigues quickly.    Transfers                   General transfer comment: Deferred for pt safety.    Ambulation/Gait               General Gait Details: unable  Stairs            Wheelchair Mobility    Modified Rankin (Stroke Patients Only)       Balance Overall balance assessment: Needs assistance Sitting-balance support: Feet unsupported, Bilateral upper extremity supported Sitting balance-Leahy Scale: Poor Sitting balance - Comments: Requires MIN-MOD A during session to maintain upright positioning. Postural control: Posterior lean, Right lateral lean   Standing balance-Leahy Scale: Zero                               Pertinent Vitals/Pain Pain Assessment Faces Pain Scale: Hurts a little bit Pain Location: L heel Pain Descriptors / Indicators: Discomfort, Sore Pain Intervention(s): Monitored during session, Repositioned    Home Living Family/patient expects to be discharged to:: Private residence Living Arrangements: Spouse/significant other Available Help at Discharge: Family;Available 24 hours/day Type of Home: House Home Access: Stairs to enter;Ramped entrance Entrance Stairs-Rails: None Entrance Stairs-Number of Steps: has a ramp, then up two more steps   Home Layout: One  level Home Equipment: IT sales professional (4 wheels);Shower seat;BSC/3in1 Additional Comments: Pt has [facility] aide that helps with shower 2x/wk. Husband rolls her in wheelchair in home to get to bathroom and lift chair. She sleeps in lift chair. Was  having HHPT, OT, RN prior to this admission. Pt reports WC is in disrepair with breaks that do not function.    Prior Function Prior Level of Function : Needs assist       Physical Assist : Mobility (physical);ADLs (physical) Mobility (physical): Transfers ADLs (physical): Dressing;Bathing;IADLs Mobility Comments: Pt reports limited mobility since last admission. Per chart has not been ambulatory in at least 1 month. Was having PT/OT at home to progress mobility. ADLs Comments: Generally requries assistance for most ADL management from her spouse/HH aide. Pt endorses transferring to [facility] with assistance from spouse.     Hand Dominance   Dominant Hand: Right    Extremity/Trunk Assessment   Upper Extremity Assessment Upper Extremity Assessment: Defer to OT evaluation    Lower Extremity Assessment Lower Extremity Assessment: Generalized weakness;LLE deficits/detail LLE Deficits / Details: Treated as NWB, pt currently discussing potential amputation with podiatry and vascular.    Cervical / Trunk Assessment Cervical / Trunk Assessment: Normal  Communication   Communication: No difficulties  Cognition Arousal/Alertness: Awake/alert Behavior During Therapy: Flat affect Overall Cognitive Status: Within Functional Limits for tasks assessed                                          General Comments      Exercises     Assessment/Plan    PT Assessment Patient needs continued PT services  PT Problem List Decreased strength;Decreased mobility;Decreased activity tolerance;Decreased balance;Pain;Decreased skin integrity;Obesity       PT Treatment Interventions Functional mobility training;Therapeutic activities;Patient/family education;Therapeutic exercise;Wheelchair mobility training;Balance training    PT Goals (Current goals can be found in the Care Plan section)  Acute Rehab PT Goals Patient Stated Goal: none stated PT Goal Formulation: With patient Time  For Goal Achievement: 02/13/22    Frequency Min 2X/week     Co-evaluation PT/OT/SLP Co-Evaluation/Treatment: Yes Reason for Co-Treatment: For patient/therapist safety;To address functional/ADL transfers PT goals addressed during session: Mobility/safety with mobility;Balance OT goals addressed during session: ADL's and self-care       AM-PAC PT "6 Clicks" Mobility  Outcome Measure Help needed turning from your back to your side while in a flat bed without using bedrails?: A Lot Help needed moving from lying on your back to sitting on the side of a flat bed without using bedrails?: A Lot Help needed moving to and from a bed to a chair (including a wheelchair)?: Total Help needed standing up from a chair using your arms (e.g., wheelchair or bedside chair)?: Total Help needed to walk in hospital room?: Total Help needed climbing 3-5 steps with a railing? : Total 6 Click Score: 8    End of Session   Activity Tolerance: Patient limited by fatigue Patient left: in bed;with call bell/phone within reach;with nursing/sitter in room;with bed alarm set Nurse Communication: Mobility status PT Visit Diagnosis: Other abnormalities of gait and mobility (R26.89);Muscle weakness (generalized) (M62.81);Pain Pain - Right/Left: Left Pain - part of body: Ankle and joints of foot    Time: 7106-2694 PT Time Calculation (min) (ACUTE ONLY): 18 min   Charges:   PT Evaluation $PT Eval Moderate Complexity: 1 Mod  Takyah Ciaramitaro, PT, [facility] 01/30/22,11:23 AM

## 2022-01-30 NOTE — Progress Notes (Signed)
Date of Admission:  01/23/2022   [name] is a 71 y.o. female with a history of history of MS, neurogenic bladder, chronic foley , treated follicular lymphoma, HTN, lumbar fusion ,immobility   Presents with left heel ulcer of many months and ankle wound which is worsening. Recent hospitalization for the same    Aug 2023  left heel pressure wound  and had undergone debridement and bone biopsy which was neg for osteo and culture rare korcuria and was sent to rehab  on Augmentin   9/13-9/16 back again for the same problem, given IV antibiotics 01/17/22 to the ED because of foul odor, sent on keflex and bactrim   01/23/22 back again with left foot wounds and was hypotensive       ID: [name] is a 71 y.o. female  Principal Problem:   Septic shock ([place]) Active Problems:   Multiple sclerosis ([place])   Obesity, Class III, BMI 40-49.9 (morbid obesity) ([place])   Cystitis due to Pseudomonas   Chronic kidney disease (CKD), stage III (moderate) ([facility])   Hypokalemia   COPD (chronic obstructive pulmonary disease) ([facility])   Non healing left heel wound   AKI (acute kidney injury) ([place])   Acute on chronic diastolic CHF (congestive heart failure) ([place])   Hyperkalemia    Subjective: Pt has no specific issues Says she has nothing to talk about She had spoken to palliative and looks like she is agreeable to left BKA Seen by cardiologist   Medications:   vitamin C  500 mg Oral BID   buPROPion  300 mg Oral Daily   Chlorhexidine Gluconate Cloth  6 each Topical Q0600   clonazePAM  0.5 mg Oral BID   DULoxetine  60 mg Oral BH-q7a   feeding supplement (NEPRO CARB STEADY)  237 mL Oral TID BM   gabapentin  300 mg Oral QHS   heparin  5,000 Units Subcutaneous Q8H   ipratropium-albuterol  3 mL Nebulization BID   multivitamin  1 tablet Oral QHS   traZODone  50 mg Oral QHS   vortioxetine HBr  5 mg Oral Daily   zinc sulfate  220 mg Oral Daily    Objective: Vital signs in last 24 hours: Temp:   [98 F (36.7 C)-98.5 F (36.9 C)] 98.4 F (36.9 C) (10/04 1537) Pulse Rate:  [43-61] 45 (10/04 1537) Resp:  [16-20] 18 (10/04 1537) BP: (117-151)/(45-73) 131/49 (10/04 1537) SpO2:  [93 %-100 %] 100 % (10/04 1537) FiO2 (%):  [21 %] 21 % (10/03 2057) Weight:  [131.2 kg] 131.2 kg (10/04 0500)  LDA Foley   PHYSICAL EXAM:  General: Alert, cooperative, pale, obese  Rt side of nostril ulcerating nodule -BCC Lungs: b/l air entry Heart: s1s2 Abdomen: Soft, non-tender,not distended. Bowel sounds normal. No masses Extremities: left leg dressing not removed I had seen the wounds covered with eschar yesterday Skin: No rashes or lesions. Or bruising Lymph: Cervical, supraclavicular normal. Neurologic: did not assess in detail  Lab Results Recent Labs    01/29/22 0611 01/30/22 0040 01/30/22 0150 01/30/22 0423  WBC 7.3  --   --  9.5  HGB 7.3* 8.1*  --  8.3*  HCT 22.7* 25.7*  --  26.6*  NA 137  --  137 136  K 3.1*  --  3.7 3.7  CL 104  --  106 108  CO2 26  --  25 24  BUN 22  --  22 22  CREATININE 1.07*  --  1.11*  1.14*   Liver Panel Recent Labs    01/28/22 0550 01/29/22 0611  ALBUMIN 3.1* 3.0*   Sedimentation Rate No results for input(s): "ESRSEDRATE" in the last 72 hours. C-Reactive Protein No results for input(s): "CRP" in the last 72 hours.  Microbiology:  Studies/Results: No results found.   Assessment/Plan: Sepsis - due to wounds- resolved ? Multiple wounds left foot covered with eschar- one on the  heel could be a pressure ulcer- the one on the anterior aspect of the ankle is more suggestive of  arteritic ulcer/vascualr insufficiency- though DP felt well Covered with eschar- these wounds have been progressing over many months with no response to antibiotics Recent biopsy did not show osteomyelitis though current MRI shows marrow edema of calcaneum PT has minimal mobility- not sure how much she can do Seen by podiatrist and BKA recommended On  zosyn Junctional bradycardia-/sinus bradycardia. Seen by cardiologist-     AKI on CKD- wonder whether bactrim was contributing ? Has resolved now- needed a few days of HD Hyperkalemiai on admission- resolved- now hypokalemia   Anemia   MS     Pseudomonas in urine culture could be colonization of foley- anyway has been treatment  Discussed the management with the patient

## 2022-01-30 NOTE — Progress Notes (Signed)
PROGRESS NOTE    [name]  KVQ:[id number] DOB: [birth year] DOA: 01/23/2022 PCP: Kirk Ruths, MD    Brief Narrative:  [name] is a 71 y.o. female  with medical history significant for multiple sclerosis,  neurogenic bladder, CKD stage IIIa, subdural hematoma s/p craniotomy in July 2023, peripheral vascular disease, morbid obesity, OSA, hyperlipidemia, COPD, follicular intra-abdominal lymphoma, major depressive disorder, recent hospitalization from 12/22/2021 through 12/26/2021 for management of nonhealing left ulcer and cellulitis of the left lower leg (bone biopsy was negative for osteomyelitis) and discharged on 2-week course of Augmentin.  She was hospitalized again from 9/13 through 01/12/2022 because of pain in the left foot, bloody drainage from the left heel ulcer.  She was seen by the podiatrist on visit.  She was treated with IV Unasyn for 48 hours and was discharged to SNF for local wound care.   She presented to the hospital again on 01/23/2022 because of hypotension and concern for possible sepsis.  She was admitted to the ICU for acute exacerbation of chronic diastolic CHF (BNP 6,433) and septic shock complicated by AKI on CKD stage IIIa with hyperkalemia.  She was treated with empiric IV antibiotics, IV hydrocortisone and vasopressors.  She has been weaned off Levophed.  She was transferred to the hospitalist service on 01/27/2022.  On 10/4 patient became concerned when the nurse tried to put a DNR bracelet on.  She stated she did not want to be a DNR and this was not properly explained to her previously.  Palliative care followed up on 10/4 and explained situation further.  At this time patient remains a full scope.  Also at this time patient is agreeable to amputation.  Vascular surgery consultation is pending.  Assessment & Plan:   Principal Problem:   Septic shock ([place]) Active Problems:   Cystitis due to Pseudomonas   AKI (acute kidney injury) ([place])   Acute on  chronic diastolic CHF (congestive heart failure) (HCC)   Non healing left heel wound   Chronic kidney disease (CKD), stage III (moderate) (HCC)   Multiple sclerosis (HCC)   Obesity, Class III, BMI 40-49.9 (morbid obesity) (HCC)   Hypokalemia   COPD (chronic obstructive pulmonary disease) (HCC)   Hyperkalemia  Body mass index is 50.63 kg/m.  (Morbid obesity)      Septic shock secondary to Pseudomonas UTI and suspected infected left foot ulcer with cellulitis: Appreciate ID involvement.  Antibiotics de-escalated to Zosyn    Left heel decubitus ulcer, left anterior ankle wound, cellulitis left foot: MRI of the left foot is concerning for chronic osteomyelitis involving the posterior aspect of the calcaneus and findings concerning for diabetic myopathy/myositis and left foot cellulitis.    Dr. Amalia Hailey, podiatrist discussed the case with the patient at the bedside.  Dr. Amalia Hailey recommended amputation of the left foot to avoid proximal spread of infection that could potentially lead to severe sepsis and death.  Patient decided to proceed with amputation.  Dr. Lucky Cowboy, vascular surgeon has been consulted.  Ravishankar, ID specialist, has been consulted as well.  As of 10/4 patient is agreeable to surgery.  Vascular surgery consultation is pending.   AKI on CKD stage IIIa: She required hemodialysis support for AKI.  Creatinine has improved and hemodialysis has been discontinued.  Follow-up with nephrologist as outpatient   Acute on chronic diastolic CHF, paroxysmal atrial fibrillation: Compensated.  Follow-up with cardiologist as outpatient   Hypokalemia: Monitor and replace as necessary   Hypomagnesemia, monitor and replace as  necessary   Worsening anemia of chronic disease, normocytic anemia:  Received 1 unit PRBC on 10/3.  Hemoglobin responded appropriately.  No further need for transfusion at this time.  Monitor daily H&H   Multiple sclerosis with neurogenic bladder: Continue indwelling Foley  catheter.   Hyperkalemia and hyponatremia: Resolved     Other comorbidities include COPD, OSA     DVT prophylaxis: SQ heparin Code Status: DNR Family Communication: None today.  Unable to reach husband via phone Disposition Plan: Status is: Inpatient Remains inpatient appropriate because: Lower extremity infection on IV antibiotics.  Pending vascular surgery follow-up and plan for possible amputation.   Level of care: Med-Surg  Consultants:  Vascular surgery Podiatry Palliative care Cardiology  Procedures:  None   Antimicrobials Zosyn   Subjective: Seen and examined.  Flattened affect.  Answers questions intermittently.  No visible distress  Objective: Vitals:   01/30/22 0500 01/30/22 0809 01/30/22 1129 01/30/22 1537  BP:  122/73 (!) 151/55 (!) 131/49  Pulse:  (!) 46 (!) 45 (!) 45  Resp:  '16 20 18  '$ Temp:  98 F (36.7 C) 98 F (36.7 C) 98.4 F (36.9 C)  TempSrc:  Oral  Oral  SpO2:  97% 95% 100%  Weight: 131.2 kg     Height:        Intake/Output Summary (Last 24 hours) at 01/30/2022 1551 Last data filed at 01/30/2022 1541 Gross per 24 hour  Intake 868.23 ml  Output 1015 ml  Net -146.77 ml   Filed Weights   01/25/22 0930 01/25/22 1307 01/30/22 0500  Weight: 135.1 kg 133.8 kg 131.2 kg    Examination:  General exam: No visible distress Respiratory system: Clear to auscultation. Respiratory effort normal. Cardiovascular system: S1-S2, RRR, no murmurs, no pedal edema Gastrointestinal system: Soft, NT/ND, normal bowel sounds Central nervous system: Alert and oriented. No focal neurological deficits. Extremities: Left lower extremity with edema, erythematous, wounds with eschar Skin: As above Psychiatry: Judgement and insight appear impaired. Mood & affect flattened.     Data Reviewed: I have personally reviewed following labs and imaging studies  CBC: Recent Labs  Lab 01/26/22 0455 01/27/22 0420 01/28/22 0550 01/29/22 0611 01/30/22 0040  01/30/22 0423  WBC 7.5 8.7 7.4 7.3  --  9.5  NEUTROABS  --   --   --   --   --  7.1  HGB 7.6* 7.7* 7.6* 7.3* 8.1* 8.3*  HCT 23.4* 24.2* 24.8* 22.7* 25.7* 26.6*  MCV 83.0 84.3 87.0 85.3  --  84.4  PLT 226 237 222 160  --  809   Basic Metabolic Panel: Recent Labs  Lab 01/24/22 0533 01/25/22 0534 01/27/22 0420 01/28/22 0550 01/29/22 0611 01/30/22 0150 01/30/22 0423  NA 127*   < > 136 138 137 137 136  K 5.4*   < > 3.4* 3.4* 3.1* 3.7 3.7  CL 95*   < > 101 104 104 106 108  CO2 20*   < > '27 27 26 25 24  '$ GLUCOSE 125*   < > 83 83 92 106* 89  BUN 70*   < > 35* 29* '22 22 22  '$ CREATININE 5.97*   < > 1.85* 1.46* 1.07* 1.11* 1.14*  CALCIUM 8.3*   < > 8.9 8.9 8.8* 8.7* 8.8*  MG 2.3  --  1.8 1.8 1.6* 1.9 2.0  PHOS 7.4*  --  3.0 2.9 2.7  --   --    < > = values in this interval not displayed.  GFR: Estimated Creatinine Clearance: 61 mL/min (A) (by C-G formula based on SCr of 1.14 mg/dL (H)). Liver Function Tests: Recent Labs  Lab 01/27/22 0420 01/28/22 0550 01/29/22 0611  ALBUMIN 3.2* 3.1* 3.0*   No results for input(s): "LIPASE", "AMYLASE" in the last 168 hours. No results for input(s): "AMMONIA" in the last 168 hours. Coagulation Profile: No results for input(s): "INR", "PROTIME" in the last 168 hours. Cardiac Enzymes: No results for input(s): "CKTOTAL", "CKMB", "CKMBINDEX", "TROPONINI" in the last 168 hours. BNP (last 3 results) No results for input(s): "PROBNP" in the last 8760 hours. HbA1C: No results for input(s): "HGBA1C" in the last 72 hours. CBG: Recent Labs  Lab 01/23/22 1638  GLUCAP 102*   Lipid Profile: No results for input(s): "CHOL", "HDL", "LDLCALC", "TRIG", "CHOLHDL", "LDLDIRECT" in the last 72 hours. Thyroid Function Tests: No results for input(s): "TSH", "T4TOTAL", "FREET4", "T3FREE", "THYROIDAB" in the last 72 hours. Anemia Panel: Recent Labs    01/27/22 1617  VITAMINB12 748   Sepsis Labs: Recent Labs  Lab 01/24/22 0533 01/25/22 0534   PROCALCITON 0.22 0.39    Recent Results (from the past 240 hour(s))  Blood Culture (routine x 2)     Status: None   Collection Time: 01/23/22  9:37 AM   Specimen: BLOOD  Result Value Ref Range Status   Specimen Description BLOOD RIGHT ANTECUBITAL  Final   Special Requests   Final    BOTTLES DRAWN AEROBIC AND ANAEROBIC Blood Culture adequate volume   Culture   Final    NO GROWTH 6 DAYS Performed at [facility], [address] [place], [place] [address]    Report Status 01/29/2022 FINAL  Final  Blood Culture (routine x 2)     Status: None   Collection Time: 01/23/22  9:37 AM   Specimen: BLOOD  Result Value Ref Range Status   Specimen Description BLOOD LEFT ANTECUBITAL  Final   Special Requests   Final    BOTTLES DRAWN AEROBIC AND ANAEROBIC Blood Culture results may not be optimal due to an inadequate volume of blood received in culture bottles   Culture   Final    NO GROWTH 6 DAYS Performed at [facility], [address] [place], [place] [address]    Report Status 01/29/2022 FINAL  Final  Urine Culture     Status: Abnormal   Collection Time: 01/23/22  9:37 AM   Specimen: In/Out Cath Urine  Result Value Ref Range Status   Specimen Description   Final    IN/OUT CATH URINE Performed at [facility], [address], [place], [place] [address]    Special Requests   Final    NONE Performed at [facility], [address] [place], [place] [address]    Culture >=100,000 COLONIES/mL PSEUDOMONAS AERUGINOSA (A)  Final   Report Status 01/25/2022 FINAL  Final   Organism ID, Bacteria PSEUDOMONAS AERUGINOSA (A)  Final      Susceptibility   Pseudomonas aeruginosa - MIC*    CEFTAZIDIME 2 SENSITIVE Sensitive     CIPROFLOXACIN <=0.25 SENSITIVE Sensitive     GENTAMICIN <=1 SENSITIVE Sensitive     IMIPENEM 2 SENSITIVE Sensitive     PIP/TAZO <=4 SENSITIVE Sensitive     CEFEPIME 4 SENSITIVE Sensitive     * >=100,000 COLONIES/mL PSEUDOMONAS  AERUGINOSA  MRSA Next Gen by PCR, Nasal     Status: None   Collection Time: 01/23/22  4:54 PM   Specimen: Nasal Mucosa; Nasal Swab  Result  Value Ref Range Status   MRSA by PCR Next Gen NOT DETECTED NOT DETECTED Final    Comment: (NOTE) The GeneXpert MRSA Assay (FDA approved for NASAL specimens only), is one component of a comprehensive MRSA colonization surveillance program. It is not intended to diagnose MRSA infection nor to guide or monitor treatment for MRSA infections. Test performance is not FDA approved in patients less than 32 years old. Performed at [facility], [address] [place], [place] [address]   Culture, blood (Routine X 2) w Reflex to ID Panel     Status: None   Collection Time: 01/23/22  5:02 PM   Specimen: BLOOD RIGHT HAND  Result Value Ref Range Status   Specimen Description BLOOD RIGHT HAND  Final   Special Requests   Final    BOTTLES DRAWN AEROBIC AND ANAEROBIC Blood Culture adequate volume   Culture   Final    NO GROWTH 6 DAYS Performed at [facility], [address], [place], [place] [address]    Report Status 01/29/2022 FINAL  Final  Aerobic/Anaerobic Culture w Gram Stain (surgical/deep wound)     Status: None   Collection Time: 01/24/22  5:04 PM   Specimen: Wound  Result Value Ref Range Status   Specimen Description   Final    WOUND Performed at [facility], [place] [address], [place], [place] [address]    Special Requests   Final    LEFT FOOT Performed at [facility], [address] [place], [place] [address]    Gram Stain   Final    NO WBC SEEN FEW GRAM POSITIVE COCCI IN PAIRS FEW GRAM NEGATIVE RODS    Culture   Final    ABUNDANT MULTIPLE ORGANISMS PRESENT, NONE PREDOMINANT NO STAPHYLOCOCCUS AUREUS ISOLATED No Pseudomonas species isolated NO ANAEROBES ISOLATED Performed at [facility], [address] [place], [place] [address]    Report Status 01/29/2022 FINAL  Final          Radiology Studies: No results found.      Scheduled Meds:  vitamin C  500 mg Oral BID   buPROPion  300 mg Oral Daily   Chlorhexidine Gluconate Cloth  6 each Topical Q0600   clonazePAM  0.5 mg Oral BID   DULoxetine  60 mg Oral BH-q7a   feeding supplement (NEPRO CARB STEADY)  237 mL Oral TID BM   gabapentin  300 mg Oral QHS   heparin  5,000 Units Subcutaneous Q8H   ipratropium-albuterol  3 mL Nebulization BID   multivitamin  1 tablet Oral QHS   traZODone  50 mg Oral QHS   vortioxetine HBr  5 mg Oral Daily   zinc sulfate  220 mg Oral Daily   Continuous Infusions:  sodium chloride Stopped (01/29/22 1637)   anticoagulant sodium citrate     piperacillin-tazobactam (ZOSYN)  IV 3.375 g (01/30/22 1435)     LOS: 7 days     Sidney Ace, MD [facility]   If 7PM-7AM, please contact night-coverage  01/30/2022, 3:51 PM

## 2022-01-30 NOTE — Progress Notes (Signed)
       CROSS COVER NOTE  NAME: DESTYNEE STRINGFELLOW MRN: [id number] DOB : 26-Apr-1951    Date of Service   01/30/2022   HPI/Events of Note   Notified by nursing that M(r)s Suchecki's HR is sustaining in the 35s. BP 123/45. Patient is currently asymptomatic. Recent TSH normal. Patient does have a history of sleep apnea but is intolerant to CPAP.  Interventions   Assessment/Plan:  EKG Check Electrolytes K--> 3.7 Mg--> 1.9     This document was prepared using Dragon voice recognition software and may include unintentional dictation errors.  Neomia Glass DNP, MHA, FNP-BC Nurse Practitioner [facility] Pager [phone]

## 2022-01-30 NOTE — H&P (View-Only) (Signed)
[facility] Vascular Consult Note  MRN : [id number]  [name] is a 71 y.o. (13-Sep-1950) female who presents with chief complaint of  Chief Complaint  Patient presents with   Possible Sepsis   .   Consulting Physician: Reason for consult: History of Present Illness: [name] is a 71 year old female who presents to [facility] following worsening infection of her left foot.  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  The patient has underwent multiple rounds of antibiotics with lack of healing.  It has been recommended by podiatry that the patient undergo a below-knee amputation.  The patient previously had ABIs done which showed normal perfusion.  Current Facility-Administered Medications  Medication Dose Route Frequency Provider Last Rate Last Admin   0.9 %  sodium chloride infusion   Intravenous PRN Jennye Boroughs, MD   Stopped at 01/29/22 1637   acetaminophen (TYLENOL) tablet 650 mg  650 mg Oral Q6H PRN Jennye Boroughs, MD   650 mg at 01/29/22 2031   alteplase (CATHFLO ACTIVASE) injection 2 mg  2 mg Intracatheter Once PRN Jennye Boroughs, MD       anticoagulant sodium citrate solution 5 mL  5 mL Intracatheter PRN Jennye Boroughs, MD       ascorbic acid (VITAMIN C) tablet 500 mg  500 mg Oral BID Jennye Boroughs, MD   500 mg at 01/30/22 3704   benzonatate (TESSALON) capsule 100 mg  100 mg Oral TID PRN Jennye Boroughs, MD   100 mg at 01/25/22 1807   buPROPion (WELLBUTRIN XL) 24 hr tablet 300 mg  300 mg Oral Daily Jennye Boroughs, MD   300 mg at 01/30/22 0936   butalbital-acetaminophen-caffeine (FIORICET) 50-325-40 MG per tablet 1 tablet  1 tablet Oral Q6H PRN Jennye Boroughs, MD   1 tablet at 01/28/22 2124   Chlorhexidine Gluconate Cloth 2 % PADS 6 each  6 each Topical Q0600 Jennye Boroughs, MD   6 each at 01/30/22 0525   clonazePAM (KLONOPIN) tablet 0.5 mg  0.5 mg Oral BID Jennye Boroughs, MD   0.5 mg at 01/30/22  0936   DULoxetine (CYMBALTA) DR capsule 60 mg  60 mg Oral Ailene Ravel, MD   60 mg at 01/30/22 0626   feeding supplement (NEPRO CARB STEADY) liquid 237 mL  237 mL Oral TID BM Jennye Boroughs, MD   237 mL at 01/30/22 0936   gabapentin (NEURONTIN) capsule 300 mg  300 mg Oral QHS Jennye Boroughs, MD   300 mg at 01/29/22 2224   heparin injection 1,000 Units  1,000 Units Intracatheter PRN Jennye Boroughs, MD       heparin injection 5,000 Units  5,000 Units Subcutaneous Q8H Jennye Boroughs, MD   5,000 Units at 01/30/22 1437   ipratropium-albuterol (DUONEB) 0.5-2.5 (3) MG/3ML nebulizer solution 3 mL  3 mL Nebulization BID Jennye Boroughs, MD   3 mL at 01/30/22 0818   lidocaine (PF) (XYLOCAINE) 1 % injection 5 mL  5 mL Intradermal PRN Jennye Boroughs, MD       lidocaine-prilocaine (EMLA) cream 1 Application  1 Application Topical PRN Jennye Boroughs, MD       multivitamin (RENA-VIT) tablet 1 tablet  1 tablet Oral QHS Jennye Boroughs, MD   1 tablet at 01/29/22 2223   ondansetron (ZOFRAN) injection 4 mg  4 mg Intravenous Q8H PRN Jennye Boroughs, MD   4 mg at 01/29/22 1653   Oral care mouth rinse  15  mL Mouth Rinse PRN Jennye Boroughs, MD       oxyCODONE (Oxy IR/ROXICODONE) immediate release tablet 5-10 mg  5-10 mg Oral Q4H PRN Ralene Muskrat B, MD   10 mg at 01/30/22 1957   pentafluoroprop-tetrafluoroeth (GEBAUERS) aerosol 1 Application  1 Application Topical PRN Jennye Boroughs, MD       piperacillin-tazobactam (ZOSYN) IVPB 3.375 g  3.375 g Intravenous Q8H Renda Rolls, RPH 12.5 mL/hr at 01/30/22 1800 Infusion Verify at 01/30/22 1800   polyethylene glycol (MIRALAX / GLYCOLAX) packet 17 g  17 g Oral Daily PRN Jennye Boroughs, MD       traZODone (DESYREL) tablet 50 mg  50 mg Oral QHS Jennye Boroughs, MD   50 mg at 01/29/22 2223   vortioxetine HBr (TRINTELLIX) tablet 5 mg  5 mg Oral Daily Jennye Boroughs, MD   5 mg at 01/30/22 2025   zinc sulfate capsule 220 mg  220 mg Oral Daily Jennye Boroughs, MD   220 mg  at 01/30/22 4270   Facility-Administered Medications Ordered in Other Encounters  Medication Dose Route Frequency Provider Last Rate Last Admin   heparin lock flush 100 unit/mL  500 Units Intracatheter Once PRN Sindy Guadeloupe, MD        Past Medical History:  Diagnosis Date   Abdominal aortic atherosclerosis ([place]) 11/11/2016   ADHD    Anxiety    COPD (chronic obstructive pulmonary disease) (HCC)    Depression    major depressive   Dyspnea    doe   Edema    left leg   Follicular lymphoma (HCC)    B Cell   Follicular lymphoma grade II (HCC)    Hypertension    Hypotension    idiopathic   Kyphoscoliosis and scoliosis 11/26/2011   Morbid obesity ([place]) 01/05/2016   Multiple sclerosis ([place])    Multiple sclerosis ([place])    1980's   Neuromuscular disorder ([place])    Obstructive and reflux uropathy    foley   Pain    atypical facial   Peripheral vascular disease of lower extremity with ulceration ([place]) 11/08/2015   Skin ulcer ([place]) 11/08/2015   Weakness    generalized. has MS    Past Surgical History:  Procedure Laterality Date   BACK SURGERY N/A 2002   BONE BIOPSY Left 12/24/2021   Procedure: BONE BIOPSY-HEEL BONE;  Surgeon: Trula Slade, DPM;  Location: [facility];  Service: Podiatry;  Laterality: Left;   CYST EXCISION     lower back   INGUINAL LYMPH NODE BIOPSY Left 07/04/2016   Procedure: INGUINAL LYMPH NODE BIOPSY;  Surgeon: Christene Lye, MD;  Location: [facility];  Service: General;  Laterality: Left;   ORIF FEMUR FRACTURE Left 02/04/2020   Procedure: OPEN REDUCTION INTERNAL FIXATION (ORIF) DISTAL FEMUR FRACTURE;  Surgeon: Shona Needles, MD;  Location: [place];  Service: Orthopedics;  Laterality: Left;   PORTACATH PLACEMENT N/A 07/22/2016   Procedure: INSERTION PORT-A-CATH;  Surgeon: Christene Lye, MD;  Location: [facility];  Service: General;  Laterality: N/A;   TONSILLECTOMY AND ADENOIDECTOMY     TUBAL LIGATION     WOUND DEBRIDEMENT Left 12/24/2021    Procedure: DEBRIDEMENT WOUND-HEEL ULCER;  Surgeon: Trula Slade, DPM;  Location: [facility];  Service: Podiatry;  Laterality: Left;    Social History Social History   Tobacco Use   Smoking status: Former    Packs/day: 1.00    Years: 20.00    Total pack years: 20.00  Types: Cigarettes    Start date: 04/30/1995    Quit date: 02/03/2016    Years since quitting: 5.9   Smokeless tobacco: Never  Vaping Use   Vaping Use: Some days  Substance Use Topics   Alcohol use: No    Alcohol/week: 0.0 standard drinks of alcohol   Drug use: Yes    Types: Marijuana    Comment: smokes THC occasionally per pt     Family History Family History  Problem Relation Age of Onset   COPD Mother    Diabetes Mother    Heart failure Mother    Alcohol abuse Father    Kidney disease Father    Kidney failure Father    Arthritis Sister    CAD Maternal Grandmother    Stroke Maternal Grandfather    Arthritis Sister    Mental illness Sister    Arthritis Brother     No Known Allergies   REVIEW OF SYSTEMS (Negative unless checked)  Constitutional: '[]'$ Weight loss  '[]'$ Fever  '[]'$ Chills Cardiac: '[]'$ Chest pain   '[]'$ Chest pressure   '[]'$ Palpitations   '[]'$ Shortness of breath when laying flat   '[]'$ Shortness of breath at rest   '[]'$ Shortness of breath with exertion. Vascular:  '[]'$ Pain in legs with walking   '[]'$ Pain in legs at rest   '[]'$ Pain in legs when laying flat   '[]'$ Claudication   '[]'$ Pain in feet when walking  '[]'$ Pain in feet at rest  '[]'$ Pain in feet when laying flat   '[]'$ History of DVT   '[]'$ Phlebitis   '[]'$ Swelling in legs   '[]'$ Varicose veins   '[x]'$ Non-healing ulcers Pulmonary:   '[]'$ Uses home oxygen   '[]'$ Productive cough   '[]'$ Hemoptysis   '[]'$ Wheeze  '[]'$ COPD   '[]'$ Asthma Neurologic:  '[]'$ Dizziness  '[]'$ Blackouts   '[]'$ Seizures   '[]'$ History of stroke   '[]'$ History of TIA  '[]'$ Aphasia   '[]'$ Temporary blindness   '[]'$ Dysphagia   '[]'$ Weakness or numbness in arms   '[]'$ Weakness or numbness in legs Musculoskeletal:  '[]'$ Arthritis   '[]'$ Joint swelling   '[]'$ Joint pain    '[]'$ Low back pain Hematologic:  '[]'$ Easy bruising  '[]'$ Easy bleeding   '[]'$ Hypercoagulable state   '[]'$ Anemic  '[]'$ Hepatitis Gastrointestinal:  '[]'$ Blood in stool   '[]'$ Vomiting blood  '[]'$ Gastroesophageal reflux/heartburn   '[]'$ Difficulty swallowing. Genitourinary:  '[]'$ Chronic kidney disease   '[]'$ Difficult urination  '[]'$ Frequent urination  '[]'$ Burning with urination   '[]'$ Blood in urine Skin:  '[]'$ Rashes   '[x]'$ Ulcers   '[]'$ Wounds Psychological:  '[]'$ History of anxiety   '[]'$  History of major depression.  Physical Examination  Vitals:   01/30/22 0809 01/30/22 1129 01/30/22 1537 01/30/22 1948  BP: 122/73 (!) 151/55 (!) 131/49 131/84  Pulse: (!) 46 (!) 45 (!) 45 (!) 45  Resp: '16 20 18 19  '$ Temp: 98 F (36.7 C) 98 F (36.7 C) 98.4 F (36.9 C) 98.3 F (36.8 C)  TempSrc: Oral  Oral   SpO2: 97% 95% 100% 96%  Weight:      Height:       Body mass index is 49.65 kg/m. Gen:  WD/WN, NAD Head: [place]/AT, No temporalis wasting. Prominent temp pulse not noted. Ear/Nose/Throat: Hearing grossly intact, nares w/o erythema or drainage, oropharynx w/o Erythema/Exudate Eyes: Sclera non-icteric, conjunctiva clear Neck: Trachea midline.  No JVD.  Pulmonary:  Good air movement, respirations not labored, equal bilaterally.  Cardiac: RRR, normal S1, S2. Vascular: 2+ palpable DP pulse, left foot wrapped  Gastrointestinal: soft, non-tender/non-distended. No guarding/reflex.  Musculoskeletal: M/S 5/5 throughout.  Extremities without ischemic changes.  No deformity or atrophy. No edema.  Neurologic: Sensation grossly intact in extremities.  Symmetrical.  Speech is fluent. Motor exam as listed above. Psychiatric: Judgment intact, Mood & affect appropriate for pt's clinical situation. Dermatologic: No rashes or ulcers noted.  No cellulitis or open wounds. Lymph : No Cervical, Axillary, or Inguinal lymphadenopathy.    CBC Lab Results  Component Value Date   WBC 9.5 01/30/2022   HGB 8.3 (L) 01/30/2022   HCT 26.6 (L) 01/30/2022   MCV 84.4  01/30/2022   PLT 172 01/30/2022    BMET    Component Value Date/Time   NA 136 01/30/2022 0423   NA 125 (L) 08/01/2013 2312   K 3.7 01/30/2022 0423   K 3.5 08/01/2013 2312   CL 108 01/30/2022 0423   CL 90 (L) 08/01/2013 2312   CO2 24 01/30/2022 0423   CO2 29 08/01/2013 2312   GLUCOSE 89 01/30/2022 0423   GLUCOSE 98 08/01/2013 2312   BUN 22 01/30/2022 0423   BUN 15 08/01/2013 2312   CREATININE 1.14 (H) 01/30/2022 0423   CREATININE 1.14 (H) 02/02/2018 1613   CALCIUM 8.8 (L) 01/30/2022 0423   CALCIUM 8.9 08/01/2013 2312   GFRNONAA 51 (L) 01/30/2022 0423   GFRNONAA 50 (L) 02/02/2018 1613   GFRAA 56 (L) 02/01/2020 0313   GFRAA 58 (L) 02/02/2018 1613   Estimated Creatinine Clearance: 61 mL/min (A) (by C-G formula based on SCr of 1.14 mg/dL (H)).  COAG Lab Results  Component Value Date   INR 1.3 (H) 01/23/2022   INR 1.2 01/17/2022   INR 1.0 01/26/2020    Radiology MR ANKLE LEFT WO CONTRAST  Result Date: 01/27/2022 CLINICAL DATA:  Progressive left anterior ankle ulcer and heel ulcer. Preop imaging for possible amputation. Evaluate for osteomyelitis. EXAM: MRI OF THE LEFT ANKLE WITHOUT CONTRAST TECHNIQUE: Multiplanar, multisequence MR imaging of the ankle was performed. No intravenous contrast was administered. COMPARISON:  Left foot radiographs 01/23/2022 and 01/17/2022; FINDINGS: TENDONS Peroneal: The peroneus longus and brevis tendons are intact. Posteromedial: Intact tibialis posterior, flexor digitorum longus, and flexor hallucis longus tendons. Anterior: The tibialis anterior, extensor hallucis longus, and extensor digitorum longus tendons are intact. Achilles: Intact. Plantar Fascia: Mild-to-moderate plantar calcaneal heel spur. No plantar fasciitis. High-grade edema/fluid within the flexor digitorum brevis musculature within the visualized plantar midfoot. LIGAMENTS Lateral: The anterior and posterior talofibular, anterior and posterior tibiofibular, and calcaneofibular  ligaments are intact. Medial: The tibiotalar deep deltoid and tibial spring ligaments are intact. CARTILAGE Ankle Joint: Mild thinning of the tibiotalar cartilage, greatest at the far medial aspect of the talar dome. Subtalar Joints/Sinus Tarsi: Fat is preserved within sinus tarsi. Bones: Mild cartilage thinning of the talonavicular, navicular-cuneiform, and tarsometatarsal joints. There is again a soft tissue defect within the posterior heel just distal to the Achilles tendon insertion, contacting bone. There is again moderate marrow edema throughout the posterolateral and posterior aspect of the calcaneus, very similar to prior. This is again consistent with chronic osteomyelitis. Other: There is high-grade edema within the abductor hallucis and flexor digitorum brevis muscles with mild fluid around the peripheral fascial planes. There is complete fatty infiltration as of the abductor digiti minimi muscle as seen on prior 01/17/2022 axial T1 weighted sequence. There is again moderate swelling and fluid bright edema within the visualized dorsal midfoot to forefoot subcutaneous fat at the level of the metatarsals. IMPRESSION: 1. There is again a soft tissue defect within the posterior heel just distal to the Achilles tendon insertion, contacting bone. There is again moderate marrow edema  throughout the posterolateral and posterior aspect of the calcaneus, consistent with chronic osteomyelitis. 2. High-grade edema within the abductor hallucis and flexor digitorum brevis muscles with mild fluid around the peripheral fascial planes. This is compatible with diabetic myopathy/myositis. 3. Moderate swelling and fluid bright edema within the visualized dorsal midfoot to forefoot subcutaneous fat at the level of the metatarsals. This is suggestive of cellulitis. Electronically Signed   By: Yvonne Kendall M.D.   On: 01/27/2022 16:24   ECHOCARDIOGRAM COMPLETE  Result Date: 01/24/2022    ECHOCARDIOGRAM REPORT   Patient  Name:   RENESHA LIZAMA [facility] Date of Exam: 01/23/2022 Medical Rec #:  [id number]     Height:       64.0 in Accession #:    [id number]    Weight:       297.0 lb Date of Birth:  06-02-1950      BSA:          2.314 m Patient Age:    56 years      BP:           104/74 mmHg Patient Gender: F             HR:           85 bpm. Exam Location:  [facility] Procedure: 2D Echo, Cardiac Doppler and Color Doppler Indications:     U27.25 Acute Diastolic CHF  History:         Patient has prior history of Echocardiogram examinations, most                  recent 10/20/2019. COPD, Arrythmias:Atrial Fibrillation; Risk                  Factors:Hypotension.  Sonographer:     Cresenciano Lick RDCS Referring Phys:  [id number] Ottie Glazier Diagnosing Phys: Yolonda Kida MD IMPRESSIONS  1. Left ventricular ejection fraction, by estimation, is 50 to 55%. The left ventricle has low normal function. The left ventricle has no regional wall motion abnormalities. Left ventricular diastolic parameters were normal.  2. Right ventricular systolic function is normal. The right ventricular size is normal.  3. Left atrial size was mild to moderately dilated.  4. The mitral valve is normal in structure. No evidence of mitral valve regurgitation.  5. The aortic valve is normal in structure. Aortic valve regurgitation is not visualized. FINDINGS  Left Ventricle: Left ventricular ejection fraction, by estimation, is 50 to 55%. The left ventricle has low normal function. The left ventricle has no regional wall motion abnormalities. The left ventricular internal cavity size was normal in size. There is borderline left ventricular hypertrophy. Left ventricular diastolic parameters were normal. Right Ventricle: The right ventricular size is normal. No increase in right ventricular wall thickness. Right ventricular systolic function is normal. Left Atrium: Left atrial size was mild to moderately dilated. Right Atrium: Right atrial size was normal in size. Pericardium:  There is no evidence of pericardial effusion. Mitral Valve: The mitral valve is normal in structure. No evidence of mitral valve regurgitation. Tricuspid Valve: The tricuspid valve is normal in structure. Tricuspid valve regurgitation is trivial. Aortic Valve: The aortic valve is normal in structure. Aortic valve regurgitation is not visualized. Pulmonic Valve: The pulmonic valve was normal in structure. Pulmonic valve regurgitation is not visualized. Aorta: The ascending aorta was not well visualized. IAS/Shunts: No atrial level shunt detected by color flow Doppler.  LEFT VENTRICLE PLAX 2D LVIDd:  4.60 cm LVIDs:         3.40 cm LV PW:         1.20 cm LV IVS:        1.20 cm LVOT diam:     2.00 cm LVOT Area:     3.14 cm  RIGHT VENTRICLE RV Basal diam:  3.80 cm RV S prime:     15.00 cm/s LEFT ATRIUM             Index        RIGHT ATRIUM           Index LA diam:        5.00 cm 2.16 cm/m   RA Area:     10.80 cm LA Vol (A2C):   41.8 ml 18.06 ml/m  RA Volume:   25.40 ml  10.98 ml/m LA Vol (A4C):   27.9 ml 12.06 ml/m LA Biplane Vol: 37.0 ml 15.99 ml/m   AORTA Ao Root diam: 3.70 cm Ao Asc diam:  3.50 cm MV E velocity: 118.67 cm/s                             SHUNTS                             Systemic Diam: 2.00 cm Yolonda Kida MD Electronically signed by Yolonda Kida MD Signature Date/Time: 01/24/2022/1:21:41 PM    Final    DG Chest Port 1 View  Result Date: 01/23/2022 CLINICAL DATA:  Central line placement. EXAM: PORTABLE CHEST 1 VIEW COMPARISON:  Chest radiograph 01/23/2022 at 9:39 a.m. FINDINGS: Telemetry leads overlie the chest. A left jugular catheter has been placed and terminates over the upper SVC. The cardiomediastinal silhouette is unchanged and within normal limits for portable AP technique. The lungs remain hypoinflated. No focal airspace consolidation, overt pulmonary edema, sizable pleural effusion, or pneumothorax is identified. IMPRESSION: Interval left jugular catheter placement  as above. Electronically Signed   By: Logan Bores M.D.   On: 01/23/2022 18:42   NM Pulmonary Perfusion  Result Date: 01/23/2022 CLINICAL DATA:  Shortness of breath. EXAM: NUCLEAR MEDICINE PERFUSION LUNG SCAN TECHNIQUE: Perfusion images were obtained in multiple projections after intravenous injection of radiopharmaceutical. Ventilation scans intentionally deferred if perfusion scan and chest x-ray adequate for interpretation during COVID 19 epidemic. RADIOPHARMACEUTICALS:  4.5 mCi Tc-31mMAA IV COMPARISON:  Chest x-ray 01/23/2022 FINDINGS: No segmental or subsegmental perfusion defects to suggest pulmonary embolism. IMPRESSION: Negative perfusion study for pulmonary embolism. Electronically Signed   By: PMarijo SanesM.D.   On: 01/23/2022 16:25   DG Foot Complete Left  Result Date: 01/23/2022 CLINICAL DATA:  Evaluate for osteomyelitis. Possible sepsis. Evidence for chronic osteomyelitis in the calcaneus on previous MRI. EXAM: LEFT FOOT - COMPLETE 3+ VIEW COMPARISON:  Left foot 01/17/2022 and foot MRI 01/17/2022 FINDINGS: Again noted is diffuse soft tissue swelling in the left foot. Stable appearance of the calcaneus with calcaneal spurring. No new areas of bone destruction or periosteal reaction involving the calcaneus. Again noted is a linear density along the plantar soft tissues measuring roughly 8 mm and similar to the prior examination. Negative for an acute fracture or dislocation. Alignment of the left foot is stable. IMPRESSION: 1. No acute bone abnormality to the left foot. No radiographic evidence for osteomyelitis. Refer to recent MRI from 01/17/2022. 2. Stable appearance of the linear density  along the plantar soft tissues. Findings are suggestive for a foreign body or focal calcification. 3. Diffuse soft tissue swelling. 4. Calcaneal spurring. Electronically Signed   By: Markus Daft M.D.   On: 01/23/2022 10:10   DG Chest Port 1 View  Result Date: 01/23/2022 CLINICAL DATA:  Concern for sepsis  EXAM: PORTABLE CHEST 1 VIEW COMPARISON:  Chest x-ray dated January 17, 2022 FINDINGS: The heart size and mediastinal contours are within normal limits. Low lung volumes with hypoventilatory changes. No definite focal consolidation. The visualized skeletal structures are unremarkable. IMPRESSION: Low lung volumes with hypoventilatory changes. No definite focal consolidation. Electronically Signed   By: Yetta Glassman M.D.   On: 01/23/2022 09:56   MR FOOT LEFT WO CONTRAST  Result Date: 01/17/2022 CLINICAL DATA:  Foot swelling, osteomyelitis suspected. Patient with left anterior foot ulcer and heel ulcer with prior left heel debridement. EXAM: MRI OF THE LEFT FOOT WITHOUT CONTRAST TECHNIQUE: Multiplanar, multisequence MR imaging of the left was performed. No intravenous contrast was administered. COMPARISON:  Radiographs dated January 17, 2022 FINDINGS: Evaluation of multiple sequences is limited due to motion. Bones/Joint/Cartilage There is bone marrow edema of the posterior aspect of the calcaneus adjacent to the deep skin wound inferior to the insertion of the Achilles tendon concerning for ongoing osteomyelitis. Ligaments Limited evaluation due to motion. Muscles and Tendons Increased intrasubstance signal of the plantar muscles suggesting diabetic myopathy/myositis. No fluid collection or abscess. Soft tissues Subcutaneous soft tissue edema about the dorsum and plantar aspect of the foot. No fluid collection or abscess. Deep skin wound about the posterior aspect of the calcaneus inferior to the Achilles insertion. IMPRESSION: 1. Bone marrow edema of the posterior aspect of the calcaneus adjacent to the deep skin wound about the inferior aspect of the Achilles tendon consistent with ongoing chronic osteomyelitis. It is difficult to compare progress from prior examination due to motion and unavailability of comparable sequences. 2. Increased intramuscular signal of the plantar muscles suggesting diabetic  myopathy/myositis. 3. Generalized subcutaneous soft tissue edema about the foot without evidence of fluid collection or abscess. Electronically Signed   By: Keane Police D.O.   On: 01/17/2022 22:13   DG Foot 2 Views Left  Result Date: 01/17/2022 CLINICAL DATA:  Left foot wound. EXAM: LEFT FOOT - 2 VIEW COMPARISON:  Radiograph 01/09/2022, heel MRI 12/23/2021 FINDINGS: The bones are diffusely under mineralized. The soft tissue defect posterior to the calcaneus on prior radiograph is less apparent on the current exam. Again seen linear density in the plantar soft tissues measuring 7 mm on the lateral view, cannot be further localized on the AP view. No erosion, bone destruction, or fracture. There is diffuse soft tissue edema. No soft tissue gas. IMPRESSION: 1. Soft tissue edema. 2. The previous suspected calcaneal osteomyelitis an MRI has no radiographic correlate. 3. Unchanged linear 7 mm density in the plantar soft tissues, may represent foreign body or soft tissue calcification. Electronically Signed   By: Keith Rake M.D.   On: 01/17/2022 18:09   DG Chest 1 View  Result Date: 01/17/2022 CLINICAL DATA:  Left foot wound, possible sepsis EXAM: CHEST  1 VIEW COMPARISON:  03/23/2021 FINDINGS: Single frontal view of the chest demonstrates a stable cardiac silhouette. Chronic left suprahilar scarring. No airspace disease, effusion, or pneumothorax. No acute bony abnormalities. IMPRESSION: 1. No acute intrathoracic process. Electronically Signed   By: Randa Ngo M.D.   On: 01/17/2022 18:06   DG Foot Complete Left  Result Date: 01/10/2022 CLINICAL  DATA:  Pain, bruising in left foot EXAM: LEFT FOOT - COMPLETE 3+ VIEW COMPARISON:  None Available. FINDINGS: Plantar calcaneal spur. No acute bony abnormality. Specifically, no fracture, subluxation, or dislocation. Small linear density seen in the plantar soft tissues of unknown etiology. This could reflect a small foreign body or soft tissue calcification.  IMPRESSION: No acute bony abnormality. Small linear density in the plantar soft tissues which could reflect a small foreign body or soft tissue calcification. Electronically Signed   By: Rolm Baptise M.D.   On: 01/10/2022 00:14      Assessment/Plan 1.  Osteomyelitis  With the patient and discussed the progressive worsening of her left foot.  The patient has chronic osteomyelitis with continuing worsening ulcerations.  Based on this we have recommended a below-knee amputation.  We discussed the surgery as well as associated postrecovery process.  Following this discussion the patient is agreeable to proceed.     Plan of care discussed with Dr.Dew and he is in agreement with plan noted above.   Family Communication:  Total Time:80 minutes I spent 80 minutes in this encounter including personally reviewing extensive medical records, personally reviewing imaging studies and compared to prior scans, counseling the patient, placing orders, coordinating care and performing appropriate documentation  Thank you for allowing [place] to participate in the care of this patient.   Kris Hartmann, NP [facility] [phone] (Office Phone) [phone] (Office Fax) [phone] (Pager)  01/30/2022 8:44 PM  Staff may message me via secure chat in [place]  but this may not receive immediate response,  please page for urgent matters!  Dictation software was used to generate the above note. Typos may occur and escape review, as with typed/written notes. Any error is purely unintentional.  Please contact me directly for clarity if needed.

## 2022-01-30 NOTE — Evaluation (Signed)
Occupational Therapy Evaluation Patient Details Name: [name] MRN: [id number] DOB: Jan 22, 1951 Today's Date: 01/30/2022   History of Present Illness [name] is a 71 y.o. female with PMH including MS, COPD, follicular lymphoma, morbid obesity, PVD with lower extremity ulceration, depression / anxiety, legally blind, admitted on 01/23/2022 with  septic shock secondary to left foot infection and renal failure necessitating renal replacement therapy.   Clinical Impression   [name] was seen for OT/PT co-evaluation this date. Prior to hospital admission, pt was residing at home with her spouse. Pt noted to have multiple hospital admissions in last 6 months. Pt denies changes to home set-up or PLOF from past admissions. She states she is able to transfer to her [facility] with assistance from her spouse as well as on/off a BSC. Per pt, her WC breaks do not work and she fears falling when using it. Pt presents to acute OT demonstrating impaired ADL performance and functional mobility 2/2 generalized weakness, decreased activity tolerance, decreased functional use of her LLE and increased pain with activity (See OT problem list). Pt currently requires +2 MOD-MAX A for bed/functional mobility.  Pt would benefit from skilled OT services to address noted impairments and functional limitations (see below for any additional details) in order to maximize safety and independence while minimizing falls risk and caregiver burden. Upon hospital discharge, recommend STR to maximize pt safety and return to PLOF.       Recommendations for follow up therapy are one component of a multi-disciplinary discharge planning process, led by the attending physician.  Recommendations may be updated based on patient status, additional functional criteria and insurance authorization.   Follow Up Recommendations  Skilled nursing-short term rehab (<3 hours/day)    Assistance Recommended at Discharge    Patient can return home  with the following Assistance with cooking/housework;Help with stairs or ramp for entrance;Assist for transportation;Two people to help with bathing/dressing/bathroom;Two people to help with walking and/or transfers    Functional Status Assessment  Patient has had a recent decline in their functional status and demonstrates the ability to make significant improvements in function in a reasonable and predictable amount of time.  Equipment Recommendations  Hospital bed    Recommendations for Other Services       Precautions / Restrictions Precautions Precautions: Fall Restrictions Weight Bearing Restrictions: Yes LLE Weight Bearing: Non weight bearing Other Position/Activity Restrictions: ~1 week PTA , pt was PWB with shoe, awaiting WB orders this admission. Treated as NWB.      Mobility Bed Mobility Overal bed mobility: Needs Assistance Bed Mobility: Supine to Sit, Sit to Supine     Supine to sit: +2 for physical assistance, Mod assist Sit to supine: Max assist, +2 for physical assistance   General bed mobility comments: Fatigues quickly.    Transfers                   General transfer comment: Deferred for pt safety.      Balance Overall balance assessment: Needs assistance Sitting-balance support: Feet supported Sitting balance-Leahy Scale: Poor Sitting balance - Comments: Requires MIN-MOD A during session to maintain upright positioning. Postural control: Posterior lean, Right lateral lean                                 ADL either performed or assessed with clinical judgement   ADL Overall ADL's : Needs assistance/impaired  General ADL Comments: Pt requires MAX A +2 for bed mobility, She is able to sit EOB with variable assist but generally requires at least MOD A to maintain upright positioning during functional tasks. She requires +2 for balance/set up during seated oral care at EOB.      Vision Patient Visual Report: No change from baseline       Perception     Praxis      Pertinent Vitals/Pain Pain Assessment Pain Assessment: Faces Faces Pain Scale: Hurts a little bit Pain Location: L heel Pain Descriptors / Indicators: Discomfort, Sore Pain Intervention(s): Monitored during session, Repositioned, Limited activity within patient's tolerance     Hand Dominance Right   Extremity/Trunk Assessment Upper Extremity Assessment Upper Extremity Assessment: Generalized weakness   Lower Extremity Assessment Lower Extremity Assessment: Generalized weakness;LLE deficits/detail LLE Deficits / Details: Treated as NWB, pt currently discussing potential amputation with podiatry and vascular.       Communication Communication Communication: No difficulties   Cognition Arousal/Alertness: Awake/alert Behavior During Therapy: Flat affect, WFL for tasks assessed/performed Overall Cognitive Status: Within Functional Limits for tasks assessed                                       General Comments       Exercises Other Exercises Other Exercises: Pt educated on role of OT in acute setting, bed mobility techniques, falls prevention, and WB precautions.   Shoulder Instructions      Home Living Family/patient expects to be discharged to:: Private residence Living Arrangements: Spouse/significant other Available Help at Discharge: Family;Available 24 hours/day Type of Home: House Home Access: Stairs to enter;Ramped entrance Entrance Stairs-Number of Steps: has a ramp, then up two more steps Entrance Stairs-Rails: None Home Layout: One level     Bathroom Shower/Tub: Occupational psychologist: Standard Bathroom Accessibility: Yes How Accessible: Accessible via walker;Accessible via wheelchair Home Equipment: Wheelchair - manual;Rollator (4 wheels);Shower seat;BSC/3in1   Additional Comments: Pt has [employer] aide that helps with shower 2x/wk.  Husband rolls her in wheelchair in home to get to bathroom and lift chair. She sleeps in lift chair. Was having HHPT, OT, RN prior to this admission. Pt reports WC is in disrepair with breaks that do not function.      Prior Functioning/Environment Prior Level of Function : Needs assist       Physical Assist : Mobility (physical);ADLs (physical) Mobility (physical): Transfers ADLs (physical): Dressing;Bathing;IADLs Mobility Comments: Pt reports limited mobility since last admission. Per chart has not been ambulatory in at least 1 month. Was having PT/OT at home to progress mobility. ADLs Comments: Generally requries assistance for most ADL management from her spouse/HH aide. Pt endorses transferring to [facility] with assistance from spouse.        OT Problem List: Decreased strength;Decreased activity tolerance;Decreased safety awareness;Impaired balance (sitting and/or standing);Decreased knowledge of use of DME or AE;Pain;Decreased range of motion;Obesity;Decreased knowledge of precautions      OT Treatment/Interventions: Self-care/ADL training;Therapeutic exercise;Therapeutic activities;Energy conservation;DME and/or AE instruction;Balance training;Patient/family education    OT Goals(Current goals can be found in the care plan section) Acute Rehab OT Goals Patient Stated Goal: To be more independent OT Goal Formulation: With patient Time For Goal Achievement: 02/13/22 Potential to Achieve Goals: Fair ADL Goals Pt Will Perform Grooming: sitting;with min assist Pt Will Perform Lower Body Dressing: sitting/lateral leans;with mod assist Pt Will Transfer to Toilet: with  mod assist;with transfer board;squat pivot transfer;bedside commode Pt Will Perform Toileting - Clothing Manipulation and hygiene: sitting/lateral leans;with mod assist;with adaptive equipment  OT Frequency: Min 2X/week    Co-evaluation PT/OT/SLP Co-Evaluation/Treatment: Yes Reason for Co-Treatment: For patient/therapist  safety;To address functional/ADL transfers;Complexity of the patient's impairments (multi-system involvement) PT goals addressed during session: Mobility/safety with mobility;Balance OT goals addressed during session: ADL's and self-care      AM-PAC OT "6 Clicks" Daily Activity     Outcome Measure Help from another person eating meals?: A Little Help from another person taking care of personal grooming?: A Lot Help from another person toileting, which includes using toliet, bedpan, or urinal?: A Lot Help from another person bathing (including washing, rinsing, drying)?: A Lot Help from another person to put on and taking off regular upper body clothing?: A Lot Help from another person to put on and taking off regular lower body clothing?: A Lot 6 Click Score: 13   End of Session    Activity Tolerance: Patient limited by fatigue Patient left: in bed;with call bell/phone within reach;with bed alarm set;with nursing/sitter in room;Other (comment) (PRAFO boot on LLE)  OT Visit Diagnosis: Unsteadiness on feet (R26.81);Muscle weakness (generalized) (M62.81);Pain Pain - Right/Left: Left Pain - part of body: Leg;Ankle and joints of foot                Time: 9688-6484 OT Time Calculation (min): 18 min Charges:  OT General Charges $OT Visit: 1 Visit OT Evaluation $OT Eval Moderate Complexity: Driscoll, M.S., OTR/L 01/30/22, 1:13 PM

## 2022-01-30 NOTE — Progress Notes (Signed)
Palliative: [name] is lying quietly in bed.  She appears acutely/chronically ill, frail, obese.  She is alert and oriented x3, able to make her basic needs known.  There is no family at bedside at this time.  I ask if she has been able to speak with her husband and she tells me that he has not called nor come to the hospital.  I ask about his health, and she shares that he feels that he has poor health.  We talk about her decision to go forward with lower extremity amputation.  I ask about her discussions with her husband.  She is very quiet during our conversation, often taking several moments to speak.  I share that podiatry has signed off, and she will be evaluated by vascular and surgery for amputation.  We talked about the likelihood that she will need short-term rehab afterwards.  She is unable to give me a preference of facility at this time.  We talk about CODE STATUS, "treat the treatable, but allowing natural passing".  I ask [name] if she has ever consider these choices in the past.  She tells me she has not.  We talk about some statistics related to in-hospital attempted resuscitation.  We talk about time trial on ventilator support.  I shared that after 2 weeks, most people have tracheotomy and PEG tube and then spent extended amount of time in a nursing home.  She makes a face when I say this.  I encouraged her to consider her choices, what she does and does not want.  I ask if she has known anyone who had a tracheostomy, she shares that her brother-in-law did, but he is deceased.  I encouraged her to talk about this with her husband.  As we are talking, [name] states, "I am angry".  I ask if she is angry about the amputation or not seeing her husband.  She tells me "I have no one".  She had mentioned in the past that she had sons, but they were "useless".  I encouraged her to reach out to her husband.  She shares that she only knows her phone number, and that he has her phone.  I  encouraged her to call from the hospital phone.  I offered chaplaincy services for support.  She is contemplating.  Conference with attending, bedside nursing staff, transition of care team related to patient condition, needs, goals of care, disposition.  Plan:   At this point continue full scope/full code.  Agreeable to lower extremity amputation.  Anticipate need for short-term rehab.  Discussed CODE STATUS, now full code.  85 minutes  Quinn Axe, NP Palliative medicine team Team phone [phone] Greater than 50% of this time was spent counseling and coordinating care related to the above assessment and plan.

## 2022-01-30 NOTE — Progress Notes (Signed)
[facility] Cardiology  Patient ID: [name] MRN: [id number] DOB/AGE: May 03, 1950 71 y.o.   Admit date: 01/23/2022 Referring Physician Dr. Lanney Gins critical care Primary Physician Dr. [name] primary Primary Cardiologist none Reason for Consultation congestive heart failure    HPI: [name] is a 71yoF with a PMH of MS, neurogenic bladder with chronic Foley catheter, follicular lymphoma, hypertension, lumbar fusion, CKD 3, subdural hematoma s/p craniotomy 10/2021, PVD, OSA not on CPAP, tobacco use, hyperlipidemia, MDD, obesity (BMI 49 kg/m) with 71 multiple recent hospitalizations for treatment of cellulitis and left heel ulcers.  She presented to [facility] ED 01/23/2022 from her skilled nursing facility with concern for low blood pressures and sepsis.  She was initially admitted to the ICU for acute on chronic HFpEF and septic shock secondary to Pseudomonas cystitis and chronic osteomyelitis of the left lower leg, complicated by an AKI, she was treated with empiric antibiotics, IV hydrocortisone, and vasopressors which have been now weaned off.  Cardiology was consulted for assistance with her heart failure.  Interval History: -Reviewed cross cover notes, patient's heart rate was in the 40s. Telemetry review shows sinus bradycardia with periods of junctional bradycardia in the 40s, electrolytes within normal limits.  Patient was reportedly asymptomatic. -feels so-so, going back and forth regarding proceeding with amputation or not -Denies chest pain, shortness of breath, dizziness, lightheadedness   Vitals:   01/29/22 2057 01/30/22 0021 01/30/22 0500 01/30/22 0809  BP:  (!) 123/45  122/73  Pulse:  (!) 43  (!) 46  Resp:  18  16  Temp:  98.5 F (36.9 C)  98 F (36.7 C)  TempSrc:    Oral  SpO2: 94% 93%  97%  Weight:   131.2 kg   Height:         Intake/Output Summary (Last 24 hours) at 01/30/2022 1113 Last data filed at 01/30/2022 0600 Gross per 24 hour  Intake 1058 ml  Output 690 ml   Net 368 ml     PHYSICAL EXAM General: Chronically ill-appearing Caucasian female, in no acute distress.  Lying in hospital bed without family at bedside. HEENT:  Normocephalic and atraumatic. Neck:   No JVD.  Lungs: Normal respiratory effort on room air.  Clear to ascultation bilaterally. Heart: Bradycardic but regular. Normal S1 and S2 without gallops or murmurs.  Abdomen: non-distended appearing with excess adiposity.  Msk: Normal strength and tone for age. Extremities: No clubbing, cyanosis.  Lower extremity with Unna boot in Kerlix dressing along her foot, warmth and erythema on the lateral side extending up to her calf. Neuro: Alert and oriented x3 Psych: Depressed mood.   LABS: Basic Metabolic Panel: Recent Labs    01/28/22 0550 01/29/22 0611 01/30/22 0150 01/30/22 0423  NA 138 137 137 136  K 3.4* 3.1* 3.7 3.7  CL 104 104 106 108  CO2 '27 26 25 24  '$ GLUCOSE 83 92 106* 89  BUN 29* '22 22 22  '$ CREATININE 1.46* 1.07* 1.11* 1.14*  CALCIUM 8.9 8.8* 8.7* 8.8*  MG 1.8 1.6* 1.9 2.0  PHOS 2.9 2.7  --   --     Liver Function Tests: Recent Labs    01/28/22 0550 01/29/22 0611  ALBUMIN 3.1* 3.0*    No results for input(s): "LIPASE", "AMYLASE" in the last 72 hours. CBC: Recent Labs    01/29/22 0611 01/30/22 0040 01/30/22 0423  WBC 7.3  --  9.5  NEUTROABS  --   --  7.1  HGB 7.3* 8.1* 8.3*  HCT 22.7*  25.7* 26.6*  MCV 85.3  --  84.4  PLT 160  --  172    Cardiac Enzymes: No results for input(s): "CKTOTAL", "CKMB", "CKMBINDEX", "TROPONINI" in the last 72 hours. BNP: Invalid input(s): "POCBNP" D-Dimer: No results for input(s): "DDIMER" in the last 72 hours. Hemoglobin A1C: No results for input(s): "HGBA1C" in the last 72 hours. Fasting Lipid Panel: No results for input(s): "CHOL", "HDL", "LDLCALC", "TRIG", "CHOLHDL", "LDLDIRECT" in the last 72 hours. Thyroid Function Tests: No results for input(s): "TSH", "T4TOTAL", "T3FREE", "THYROIDAB" in the last 72  hours.  Invalid input(s): "FREET3" Anemia Panel: Recent Labs    01/27/22 1617  VITAMINB12 748     No results found.   Echo EF 50-55% 01/23/2022  TELEMETRY: Junctional bradycardia this morning with rate high 40s, sinus bradycardia rate high 40s to 50s late morning.  Data reviewed by me: Palliative care note, ID note, cross cover note, hospitalist progress note, CBC, BMP, EKGs, telemetry, vitals  ASSESSMENT AND PLAN:  Principal Problem:   Septic shock ([place]) Active Problems:   Multiple sclerosis (HCC)   Obesity, Class III, BMI 40-49.9 (morbid obesity) ([place])   Cystitis due to Pseudomonas   Chronic kidney disease (CKD), stage III (moderate) (HCC)   Hypokalemia   COPD (chronic obstructive pulmonary disease) (HCC)   Non healing left heel wound   AKI (acute kidney injury) ([place])   Acute on chronic diastolic CHF (congestive heart failure) (HCC)   Hyperkalemia    1.  Acute on chronic diastolic congestive heart failure, HFpEF, 50-55% 01/23/2022, appears euvolemic 2.  Junctional bradycardia, intermittent, currently sinus bradycardia 44-50 bpm 3.  Septic shock / Pseudomonas UTI / left heel decubitus ulcer and osteomyelitis on IV vancomycin, patient declining amputation recommended by vascular surgery. 4.  Acute kidney injury / CKD stage IIIa, continuing to improve, creatinine 1.14 and GFR 51 today 5.  Chronic anemia, hemoglobin and hematocrit improving from 7.3 and 22.7, two 8.3 and 26.6  Recommendations  1.  Agree with current therapy 2.  Diuresis as needed 3.  Defer beta-blocker therapy or calcium channel blocker 4.  No indication for pacemaker at this time, continuous monitoring on telemetry  This patient's plan of care was discussed and created with Dr. Saralyn Pilar and he is in agreement.    Tristan Schroeder, PA-C 01/30/2022 11:13 AM

## 2022-01-31 DIAGNOSIS — R6521 Severe sepsis with septic shock: Secondary | ICD-10-CM | POA: Diagnosis not present

## 2022-01-31 DIAGNOSIS — A419 Sepsis, unspecified organism: Secondary | ICD-10-CM | POA: Diagnosis not present

## 2022-01-31 MED ORDER — CHLORHEXIDINE GLUCONATE CLOTH 2 % EX PADS: 6.0000 | MEDICATED_PAD | Freq: Once | CUTANEOUS | Status: DC

## 2022-01-31 MED ORDER — CHLORHEXIDINE GLUCONATE CLOTH 2 % EX PADS
6.0000 | MEDICATED_PAD | Freq: Once | CUTANEOUS | Status: AC
  Administered 2022-01-31: 6 via TOPICAL

## 2022-01-31 MED ORDER — CEFAZOLIN IN SODIUM CHLORIDE 3-0.9 GM/100ML-% IV SOLN
3.0000 g | INTRAVENOUS | Status: DC
  Filled 2022-01-31: qty 100

## 2022-01-31 NOTE — Progress Notes (Signed)
PT Cancellation Note  Patient Details Name: [name] MRN: [id number] DOB: Sep 21, 1950   Cancelled Treatment:    Reason Eval/Treat Not Completed: Medical issues which prohibited therapy. Patient has agreed to amputation per notes. Waiting for vascular consult. Will continue to follow for continued PT during stay.      Bayron Dalto 01/31/2022, 12:43 PM

## 2022-01-31 NOTE — Progress Notes (Signed)
PROGRESS NOTE    [name]  ION:[id number] DOB: [birth year] DOA: 01/23/2022 PCP: Kirk Ruths, MD    Brief Narrative:  [name] is a 71 y.o. female  with medical history significant for multiple sclerosis,  neurogenic bladder, CKD stage IIIa, subdural hematoma s/p craniotomy in July 2023, peripheral vascular disease, morbid obesity, OSA, hyperlipidemia, COPD, follicular intra-abdominal lymphoma, major depressive disorder, recent hospitalization from 12/22/2021 through 12/26/2021 for management of nonhealing left ulcer and cellulitis of the left lower leg (bone biopsy was negative for osteomyelitis) and discharged on 2-week course of Augmentin.  She was hospitalized again from 9/13 through 01/12/2022 because of pain in the left foot, bloody drainage from the left heel ulcer.  She was seen by the podiatrist on visit.  She was treated with IV Unasyn for 48 hours and was discharged to SNF for local wound care.   She presented to the hospital again on 01/23/2022 because of hypotension and concern for possible sepsis.  She was admitted to the ICU for acute exacerbation of chronic diastolic CHF (BNP 2,440) and septic shock complicated by AKI on CKD stage IIIa with hyperkalemia.  She was treated with empiric IV antibiotics, IV hydrocortisone and vasopressors.  She has been weaned off Levophed.  She was transferred to the hospitalist service on 01/27/2022.  On 10/4 patient became concerned when the nurse tried to put a DNR bracelet on.  She stated she did not want to be a DNR and this was not properly explained to her previously.  Palliative care followed up on 10/4 and explained situation further.  At this time patient remains a full scope.  Also at this time patient is agreeable to amputation.  Vascular surgery consultation is pending.  10/5: Seen in consultation by vascular surgery.  Patient has agreed to amputation at this time.  Plan for OR 10/5.  Assessment & Plan:   Principal Problem:    Septic shock ([place]) Active Problems:   Cystitis due to Pseudomonas   Acute renal failure (HCC)   Acute on chronic diastolic CHF (congestive heart failure) (HCC)   Non healing left heel wound   Chronic kidney disease (CKD), stage III (moderate) (HCC)   Multiple sclerosis (HCC)   Obesity, Class III, BMI 40-49.9 (morbid obesity) (HCC)   Hypokalemia   COPD (chronic obstructive pulmonary disease) (HCC)   Hyperkalemia  Body mass index is 50.63 kg/m.  (Morbid obesity)      Septic shock secondary to Pseudomonas UTI and suspected infected left foot ulcer with cellulitis: Appreciate ID involvement.  Antibiotics de-escalated to Zosyn    Left heel decubitus ulcer, left anterior ankle wound, cellulitis left foot: MRI of the left foot is concerning for chronic osteomyelitis involving the posterior aspect of the calcaneus and findings concerning for diabetic myopathy/myositis and left foot cellulitis.    Dr. Amalia Hailey, podiatrist discussed the case with the patient at the bedside.  Dr. Amalia Hailey recommended amputation of the left foot to avoid proximal spread of infection that could potentially lead to severe sepsis and death.  Patient decided to proceed with amputation.  Dr. Lucky Cowboy, vascular surgeon has been consulted.  Ravishankar, ID specialist, has been consulted as well.  As of 10/4 patient is agreeable to surgery.  Vascular surgery consult appreciated.  Plan for OR 10/5   AKI on CKD stage IIIa: She required hemodialysis support for AKI.  Creatinine has improved and hemodialysis has been discontinued.  Follow-up with nephrologist as outpatient   Acute on chronic diastolic CHF,  paroxysmal atrial fibrillation: Compensated.  Follow-up with cardiologist as outpatient   Hypokalemia: Monitor and replace as necessary   Hypomagnesemia, monitor and replace as necessary   Worsening anemia of chronic disease, normocytic anemia:  Received 1 unit PRBC on 10/3.  Hemoglobin responded appropriately.  No further need for  transfusion at this time.  Monitor daily H&H   Multiple sclerosis with neurogenic bladder: Continue indwelling Foley catheter.   Hyperkalemia and hyponatremia: Resolved     Other comorbidities include COPD, OSA     DVT prophylaxis: SQ heparin Code Status: DNR Family Communication: None today.  Unable to reach husband via phone Disposition Plan: Status is: Inpatient Remains inpatient appropriate because: Lower extremity infection on IV antibiotics.  OR today with vascular surgery for amputation   Level of care: Med-Surg  Consultants:  Vascular surgery Podiatry Palliative care Cardiology  Procedures:  None   Antimicrobials Zosyn   Subjective: Seen and examined.  Flattened affect.  Answers questions intermittently.  No visible distress  Objective: Vitals:   01/31/22 0451 01/31/22 0752 01/31/22 0807 01/31/22 1204  BP: (!) 153/72 (!) 146/57  (!) 141/77  Pulse: 60 (!) 57  60  Resp: '18 18  20  '$ Temp: 97.7 F (36.5 C) 98.4 F (36.9 C)  98.4 F (36.9 C)  TempSrc:      SpO2: 96% 95% 98% 97%  Weight:      Height:        Intake/Output Summary (Last 24 hours) at 01/31/2022 1320 Last data filed at 01/31/2022 1112 Gross per 24 hour  Intake 153.87 ml  Output 1200 ml  Net -1046.13 ml   Filed Weights   01/25/22 0930 01/25/22 1307 01/30/22 0500  Weight: 135.1 kg 133.8 kg 131.2 kg    Examination:  General exam: NAD Respiratory system: Poor respiratory effort.  Bibasilar crackles.  Normal work of breathing.  Room air Cardiovascular system: S1-S2, RRR, no murmurs, no pedal edema Gastrointestinal system: Soft, NT/ND, normal bowel sounds Central nervous system: Alert and oriented. No focal neurological deficits. Extremities: Left lower extremity with edema, erythematous, wounds with eschar Skin: As above Psychiatry: Judgement and insight appear impaired. Mood & affect flattened.     Data Reviewed: I have personally reviewed following labs and imaging  studies  CBC: Recent Labs  Lab 01/26/22 0455 01/27/22 0420 01/28/22 0550 01/29/22 0611 01/30/22 0040 01/30/22 0423  WBC 7.5 8.7 7.4 7.3  --  9.5  NEUTROABS  --   --   --   --   --  7.1  HGB 7.6* 7.7* 7.6* 7.3* 8.1* 8.3*  HCT 23.4* 24.2* 24.8* 22.7* 25.7* 26.6*  MCV 83.0 84.3 87.0 85.3  --  84.4  PLT 226 237 222 160  --  644   Basic Metabolic Panel: Recent Labs  Lab 01/27/22 0420 01/28/22 0550 01/29/22 0611 01/30/22 0150 01/30/22 0423  NA 136 138 137 137 136  K 3.4* 3.4* 3.1* 3.7 3.7  CL 101 104 104 106 108  CO2 '27 27 26 25 24  '$ GLUCOSE 83 83 92 106* 89  BUN 35* 29* '22 22 22  '$ CREATININE 1.85* 1.46* 1.07* 1.11* 1.14*  CALCIUM 8.9 8.9 8.8* 8.7* 8.8*  MG 1.8 1.8 1.6* 1.9 2.0  PHOS 3.0 2.9 2.7  --   --    GFR: Estimated Creatinine Clearance: 61 mL/min (A) (by C-G formula based on SCr of 1.14 mg/dL (H)). Liver Function Tests: Recent Labs  Lab 01/27/22 0420 01/28/22 0550 01/29/22 0611  ALBUMIN 3.2* 3.1* 3.0*  No results for input(s): "LIPASE", "AMYLASE" in the last 168 hours. No results for input(s): "AMMONIA" in the last 168 hours. Coagulation Profile: No results for input(s): "INR", "PROTIME" in the last 168 hours. Cardiac Enzymes: No results for input(s): "CKTOTAL", "CKMB", "CKMBINDEX", "TROPONINI" in the last 168 hours. BNP (last 3 results) No results for input(s): "PROBNP" in the last 8760 hours. HbA1C: No results for input(s): "HGBA1C" in the last 72 hours. CBG: No results for input(s): "GLUCAP" in the last 168 hours.  Lipid Profile: No results for input(s): "CHOL", "HDL", "LDLCALC", "TRIG", "CHOLHDL", "LDLDIRECT" in the last 72 hours. Thyroid Function Tests: No results for input(s): "TSH", "T4TOTAL", "FREET4", "T3FREE", "THYROIDAB" in the last 72 hours. Anemia Panel: No results for input(s): "VITAMINB12", "FOLATE", "FERRITIN", "TIBC", "IRON", "RETICCTPCT" in the last 72 hours.  Sepsis Labs: Recent Labs  Lab 01/25/22 0534  PROCALCITON 0.39     Recent Results (from the past 240 hour(s))  Blood Culture (routine x 2)     Status: None   Collection Time: 01/23/22  9:37 AM   Specimen: BLOOD  Result Value Ref Range Status   Specimen Description BLOOD RIGHT ANTECUBITAL  Final   Special Requests   Final    BOTTLES DRAWN AEROBIC AND ANAEROBIC Blood Culture adequate volume   Culture   Final    NO GROWTH 6 DAYS Performed at [facility], [address], [place], [place] [address]    Report Status 01/29/2022 FINAL  Final  Blood Culture (routine x 2)     Status: None   Collection Time: 01/23/22  9:37 AM   Specimen: BLOOD  Result Value Ref Range Status   Specimen Description BLOOD LEFT ANTECUBITAL  Final   Special Requests   Final    BOTTLES DRAWN AEROBIC AND ANAEROBIC Blood Culture results may not be optimal due to an inadequate volume of blood received in culture bottles   Culture   Final    NO GROWTH 6 DAYS Performed at [facility], [address] [place], [place] [address]    Report Status 01/29/2022 FINAL  Final  Urine Culture     Status: Abnormal   Collection Time: 01/23/22  9:37 AM   Specimen: In/Out Cath Urine  Result Value Ref Range Status   Specimen Description   Final    IN/OUT CATH URINE Performed at [facility], [address] [place], [place] [address]    Special Requests   Final    NONE Performed at [facility], [address] [place], [place] [address]    Culture >=100,000 COLONIES/mL PSEUDOMONAS AERUGINOSA (A)  Final   Report Status 01/25/2022 FINAL  Final   Organism ID, Bacteria PSEUDOMONAS AERUGINOSA (A)  Final      Susceptibility   Pseudomonas aeruginosa - MIC*    CEFTAZIDIME 2 SENSITIVE Sensitive     CIPROFLOXACIN <=0.25 SENSITIVE Sensitive     GENTAMICIN <=1 SENSITIVE Sensitive     IMIPENEM 2 SENSITIVE Sensitive     PIP/TAZO <=4 SENSITIVE Sensitive     CEFEPIME 4 SENSITIVE Sensitive     * >=100,000 COLONIES/mL PSEUDOMONAS AERUGINOSA  MRSA Next Gen  by PCR, Nasal     Status: None   Collection Time: 01/23/22  4:54 PM   Specimen: Nasal Mucosa; Nasal Swab  Result Value Ref Range Status   MRSA by PCR Next Gen NOT DETECTED NOT DETECTED Final    Comment: (NOTE) The GeneXpert MRSA Assay (FDA approved for NASAL specimens only), is one component of a  comprehensive MRSA colonization surveillance program. It is not intended to diagnose MRSA infection nor to guide or monitor treatment for MRSA infections. Test performance is not FDA approved in patients less than 37 years old. Performed at [facility], [address] [place], [place] [address]   Culture, blood (Routine X 2) w Reflex to ID Panel     Status: None   Collection Time: 01/23/22  5:02 PM   Specimen: BLOOD RIGHT HAND  Result Value Ref Range Status   Specimen Description BLOOD RIGHT HAND  Final   Special Requests   Final    BOTTLES DRAWN AEROBIC AND ANAEROBIC Blood Culture adequate volume   Culture   Final    NO GROWTH 6 DAYS Performed at [facility], [address] [place], [place] [address]    Report Status 01/29/2022 FINAL  Final  Aerobic/Anaerobic Culture w Gram Stain (surgical/deep wound)     Status: None   Collection Time: 01/24/22  5:04 PM   Specimen: Wound  Result Value Ref Range Status   Specimen Description   Final    WOUND Performed at [facility], [place] [address], [place], [name] [address]    Special Requests   Final    LEFT FOOT Performed at [facility], [address] [place], [place] [address]    Gram Stain   Final    NO WBC SEEN FEW GRAM POSITIVE COCCI IN PAIRS FEW GRAM NEGATIVE RODS    Culture   Final    ABUNDANT MULTIPLE ORGANISMS PRESENT, NONE PREDOMINANT NO STAPHYLOCOCCUS AUREUS ISOLATED No Pseudomonas species isolated NO ANAEROBES ISOLATED Performed at [facility], [address] [place], [place] [address]    Report Status 01/29/2022 FINAL  Final         Radiology Studies: No results  found.      Scheduled Meds:  vitamin C  500 mg Oral BID   buPROPion  300 mg Oral Daily   Chlorhexidine Gluconate Cloth  6 each Topical Q0600   Chlorhexidine Gluconate Cloth  6 each Topical Once   clonazePAM  0.5 mg Oral BID   DULoxetine  60 mg Oral BH-q7a   feeding supplement (NEPRO CARB STEADY)  237 mL Oral TID BM   gabapentin  300 mg Oral QHS   heparin  5,000 Units Subcutaneous Q8H   ipratropium-albuterol  3 mL Nebulization BID   multivitamin  1 tablet Oral QHS   traZODone  50 mg Oral QHS   vortioxetine HBr  5 mg Oral Daily   zinc sulfate  220 mg Oral Daily   Continuous Infusions:  sodium chloride Stopped (01/29/22 1637)   anticoagulant sodium citrate      ceFAZolin (ANCEF) IV     piperacillin-tazobactam (ZOSYN)  IV 3.375 g (01/31/22 0534)     LOS: 8 days     Sidney Ace, MD [facility]   If 7PM-7AM, please contact night-coverage  01/31/2022, 1:20 PM

## 2022-01-31 NOTE — Progress Notes (Signed)
[facility] Cardiology  Patient ID: [name] MRN: [id number] DOB/AGE: 1950/05/27 71 y.o.   Admit date: 01/23/2022 Referring Physician Dr. Lanney Gins critical care Primary Physician Dr. [name] primary Primary Cardiologist none Reason for Consultation congestive heart failure    HPI: [name]. [name] is a 41yoF with a PMH of MS, neurogenic bladder with chronic Foley catheter, follicular lymphoma, hypertension, lumbar fusion, CKD 3, subdural hematoma s/p craniotomy 10/2021, PVD, OSA not on CPAP, tobacco use, hyperlipidemia, MDD, obesity (BMI 49 kg/m) with multiple recent hospitalizations for treatment of cellulitis and left heel ulcers.  She presented to [facility] ED 01/23/2022 from her skilled nursing facility with concern for low blood pressures and sepsis.  She was initially admitted to the ICU for acute on chronic HFpEF and septic shock secondary to Pseudomonas cystitis and chronic osteomyelitis of the left lower leg, complicated by an AKI, she was treated with empiric antibiotics, IV hydrocortisone, and vasopressors which have been now weaned off.  Cardiology was consulted for assistance with her heart failure.  Interval History: -No acute events -Agreeable to proceed with left BKA -Denies chest pain, dizziness, shortness of breath, or other complaints -Remains in sinus bradycardia to sinus rhythm on telemetry, slow heart rates occur primarily while she sleeping    Vitals:   01/30/22 2355 01/31/22 0451 01/31/22 0752 01/31/22 0807  BP: (!) 149/57 (!) 153/72 (!) 146/57   Pulse: (!) 43 60 (!) 57   Resp: '18 18 18   '$ Temp: 98.1 F (36.7 C) 97.7 F (36.5 C) 98.4 F (36.9 C)   TempSrc: Oral     SpO2: 97% 96% 95% 98%  Weight:      Height:         Intake/Output Summary (Last 24 hours) at 01/31/2022 1202 Last data filed at 01/31/2022 0530 Gross per 24 hour  Intake 153.87 ml  Output 825 ml  Net -671.13 ml     PHYSICAL EXAM General: Chronically ill-appearing Caucasian female, in no acute  distress.  Lying in hospital bed without family at bedside. HEENT:  Normocephalic and atraumatic. Neck:   No JVD.  Lungs: Normal respiratory effort on room air.  Clear to ascultation bilaterally. Heart: Regular rate and rhythm. normal S1 and S2 without gallops or murmurs.  Abdomen: non-distended appearing with excess adiposity.  Msk: Normal strength and tone for age. Extremities:   Lower extremity with Unna boot in Kerlix dressing along her foot, warmth and erythema on the lateral side extending up to her calf. Neuro: Alert and oriented x3 Psych: Depressed mood.   LABS: Basic Metabolic Panel: Recent Labs    01/29/22 0611 01/30/22 0150 01/30/22 0423  NA 137 137 136  K 3.1* 3.7 3.7  CL 104 106 108  CO2 '26 25 24  '$ GLUCOSE 92 106* 89  BUN '22 22 22  '$ CREATININE 1.07* 1.11* 1.14*  CALCIUM 8.8* 8.7* 8.8*  MG 1.6* 1.9 2.0  PHOS 2.7  --   --     Liver Function Tests: Recent Labs    01/29/22 0611  ALBUMIN 3.0*    No results for input(s): "LIPASE", "AMYLASE" in the last 72 hours. CBC: Recent Labs    01/29/22 0611 01/30/22 0040 01/30/22 0423  WBC 7.3  --  9.5  NEUTROABS  --   --  7.1  HGB 7.3* 8.1* 8.3*  HCT 22.7* 25.7* 26.6*  MCV 85.3  --  84.4  PLT 160  --  172    Cardiac Enzymes: No results for input(s): "CKTOTAL", "CKMB", "CKMBINDEX", "  TROPONINI" in the last 72 hours. BNP: Invalid input(s): "POCBNP" D-Dimer: No results for input(s): "DDIMER" in the last 72 hours. Hemoglobin A1C: No results for input(s): "HGBA1C" in the last 72 hours. Fasting Lipid Panel: No results for input(s): "CHOL", "HDL", "LDLCALC", "TRIG", "CHOLHDL", "LDLDIRECT" in the last 72 hours. Thyroid Function Tests: No results for input(s): "TSH", "T4TOTAL", "T3FREE", "THYROIDAB" in the last 72 hours.  Invalid input(s): "FREET3" Anemia Panel: No results for input(s): "VITAMINB12", "FOLATE", "FERRITIN", "TIBC", "IRON", "RETICCTPCT" in the last 72 hours.   No results found.   Echo EF 50-55%  01/23/2022  TELEMETRY: Sinus bradycardia to sinus rhythm with rates from 45-60 without evidence of long sinus pauses, consistent high-grade AV block  Data reviewed by me: Palliative care note, ID note, cross cover note, hospitalist progress note, CBC, BMP, EKGs, telemetry, vitals  ASSESSMENT AND PLAN:  Principal Problem:   Septic shock ([place]) Active Problems:   Multiple sclerosis (HCC)   Obesity, Class III, BMI 40-49.9 (morbid obesity) ([place])   Cystitis due to Pseudomonas   Chronic kidney disease (CKD), stage III (moderate) (HCC)   Hypokalemia   COPD (chronic obstructive pulmonary disease) (HCC)   Non healing left heel wound   Acute renal failure (HCC)   Acute on chronic diastolic CHF (congestive heart failure) (HCC)   Hyperkalemia    1.  Acute on chronic diastolic congestive heart failure, HFpEF, 50-55% 01/23/2022, appears euvolemic 2.  Junctional bradycardia, intermittent, predominantly occurring while sleeping currently sinus bradycardia to sinus rhythm 45-60 bpm 3.  Septic shock / Pseudomonas UTI / left heel decubitus ulcer and osteomyelitis on IV vancomycin, patient declining amputation recommended by vascular surgery. 4.  Acute kidney injury / CKD stage IIIa, continuing to improve, creatinine 1.14 and GFR 51 today 5.  Chronic anemia, hemoglobin and hematocrit improving from 7.3 and 22.7, to 8.3 and 26.6  Recommendations  1.  Agree with current therapy 2.  Diuresis as needed 3.  Defer beta-blocker therapy or calcium channel blocker 4.  No indication for pacemaker at this time, continuous monitoring on telemetry 5.  Recommend outpatient follow-up with cardiology for consideration of Holter monitoring for further evaluation of intermittent junctional bradycardia.  Cardiology will sign off.  Please reengage if needed  This patient's plan of care was discussed and created with Dr. Saralyn Pilar and he is in agreement.    Tristan Schroeder, PA-C 01/31/2022 12:02 PM

## 2022-02-01 ENCOUNTER — Encounter: Payer: Self-pay | Admitting: Pulmonary Disease

## 2022-02-01 ENCOUNTER — Encounter
Admission: EM | Disposition: A | Discharge status: Skilled Nursing Facility | Payer: Medicare HMO | Source: Home / Self Care | Attending: Internal Medicine

## 2022-02-01 ENCOUNTER — Inpatient Hospital Stay: Payer: Medicare HMO | Admitting: Anesthesiology

## 2022-02-01 ENCOUNTER — Other Ambulatory Visit: Payer: Self-pay

## 2022-02-01 DIAGNOSIS — M869 Osteomyelitis, unspecified: Secondary | ICD-10-CM | POA: Diagnosis not present

## 2022-02-01 DIAGNOSIS — A419 Sepsis, unspecified organism: Secondary | ICD-10-CM | POA: Diagnosis not present

## 2022-02-01 DIAGNOSIS — R6521 Severe sepsis with septic shock: Secondary | ICD-10-CM | POA: Diagnosis not present

## 2022-02-01 DIAGNOSIS — I509 Heart failure, unspecified: Secondary | ICD-10-CM | POA: Diagnosis not present

## 2022-02-01 DIAGNOSIS — Z87891 Personal history of nicotine dependence: Secondary | ICD-10-CM | POA: Diagnosis not present

## 2022-02-01 DIAGNOSIS — M86672 Other chronic osteomyelitis, left ankle and foot: Secondary | ICD-10-CM

## 2022-02-01 DIAGNOSIS — N179 Acute kidney failure, unspecified: Secondary | ICD-10-CM | POA: Diagnosis not present

## 2022-02-01 DIAGNOSIS — L97529 Non-pressure chronic ulcer of other part of left foot with unspecified severity: Secondary | ICD-10-CM | POA: Diagnosis not present

## 2022-02-01 DIAGNOSIS — I130 Hypertensive heart and chronic kidney disease with heart failure and stage 1 through stage 4 chronic kidney disease, or unspecified chronic kidney disease: Secondary | ICD-10-CM | POA: Diagnosis not present

## 2022-02-01 DIAGNOSIS — N189 Chronic kidney disease, unspecified: Secondary | ICD-10-CM | POA: Diagnosis not present

## 2022-02-01 HISTORY — PX: AMPUTATION: SHX166

## 2022-02-01 LAB — CBC WITH DIFFERENTIAL/PLATELET
Abs Immature Granulocytes: 0.09 10*3/uL — ABNORMAL HIGH (ref 0.00–0.07)
Basophils Absolute: 0.1 10*3/uL (ref 0.0–0.1)
Basophils Relative: 1 %
Eosinophils Absolute: 0.3 10*3/uL (ref 0.0–0.5)
Eosinophils Relative: 3 %
HCT: 27.6 % — ABNORMAL LOW (ref 36.0–46.0)
Hemoglobin: 8.7 g/dL — ABNORMAL LOW (ref 12.0–15.0)
Immature Granulocytes: 1 %
Lymphocytes Relative: 7 %
Lymphs Abs: 0.7 10*3/uL (ref 0.7–4.0)
MCH: 26.9 pg (ref 26.0–34.0)
MCHC: 31.5 g/dL (ref 30.0–36.0)
MCV: 85.2 fL (ref 80.0–100.0)
Monocytes Absolute: 0.7 10*3/uL (ref 0.1–1.0)
Monocytes Relative: 7 %
Neutro Abs: 7.9 10*3/uL — ABNORMAL HIGH (ref 1.7–7.7)
Neutrophils Relative %: 81 %
Platelets: 180 10*3/uL (ref 150–400)
RBC: 3.24 MIL/uL — ABNORMAL LOW (ref 3.87–5.11)
RDW: 15 % (ref 11.5–15.5)
WBC: 9.7 10*3/uL (ref 4.0–10.5)
nRBC: 0 % (ref 0.0–0.2)

## 2022-02-01 LAB — BASIC METABOLIC PANEL
Anion gap: 4 — ABNORMAL LOW (ref 5–15)
BUN: 18 mg/dL (ref 8–23)
CO2: 26 mmol/L (ref 22–32)
Calcium: 9.1 mg/dL (ref 8.9–10.3)
Chloride: 106 mmol/L (ref 98–111)
Creatinine, Ser: 1.12 mg/dL — ABNORMAL HIGH (ref 0.44–1.00)
GFR, Estimated: 53 mL/min — ABNORMAL LOW (ref >60)
Glucose, Bld: 88 mg/dL (ref 70–99)
Potassium: 3.7 mmol/L (ref 3.5–5.1)
Sodium: 136 mmol/L (ref 135–145)

## 2022-02-01 LAB — MAGNESIUM: Magnesium: 1.6 mg/dL — ABNORMAL LOW (ref 1.7–2.4)

## 2022-02-01 SURGERY — AMPUTATION BELOW KNEE
Anesthesia: General | Site: Knee | Laterality: Left

## 2022-02-01 MED ORDER — LIDOCAINE HCL (CARDIAC) PF 100 MG/5ML IV SOSY
PREFILLED_SYRINGE | INTRAVENOUS | Status: DC | PRN
  Administered 2022-02-01: 100 mg via INTRAVENOUS

## 2022-02-01 MED ORDER — LACTATED RINGERS IV SOLN: INTRAVENOUS | Status: DC | PRN

## 2022-02-01 MED ORDER — PHENYLEPHRINE HCL-NACL 20-0.9 MG/250ML-% IV SOLN
INTRAVENOUS | Status: AC
  Filled 2022-02-01: qty 250

## 2022-02-01 MED ORDER — MORPHINE SULFATE (PF) 2 MG/ML IV SOLN
2.0000 mg | INTRAVENOUS | Status: DC | PRN
  Administered 2022-02-01 – 2022-02-03 (×6): 2 mg via INTRAVENOUS
  Filled 2022-02-01 (×7): qty 1

## 2022-02-01 MED ORDER — 0.9 % SODIUM CHLORIDE (POUR BTL) OPTIME
TOPICAL | Status: DC | PRN
  Administered 2022-02-01: 1000 mL

## 2022-02-01 MED ORDER — HYDROMORPHONE HCL 1 MG/ML IJ SOLN: 1.0000 mg | Freq: Once | INTRAMUSCULAR | Status: DC | PRN

## 2022-02-01 MED ORDER — OXYCODONE HCL 5 MG/5ML PO SOLN: 5.0000 mg | Freq: Once | ORAL | Status: AC | PRN

## 2022-02-01 MED ORDER — FENTANYL CITRATE (PF) 100 MCG/2ML IJ SOLN
INTRAMUSCULAR | Status: AC
  Filled 2022-02-01: qty 2

## 2022-02-01 MED ORDER — ONDANSETRON HCL 4 MG/2ML IJ SOLN: 4.0000 mg | Freq: Once | INTRAMUSCULAR | Status: DC | PRN

## 2022-02-01 MED ORDER — PHENYLEPHRINE HCL (PRESSORS) 10 MG/ML IV SOLN
INTRAVENOUS | Status: DC | PRN
  Administered 2022-02-01: 240 ug via INTRAVENOUS
  Administered 2022-02-01: 160 ug via INTRAVENOUS

## 2022-02-01 MED ORDER — ACETAMINOPHEN 10 MG/ML IV SOLN: 1000.0000 mg | Freq: Once | INTRAVENOUS | Status: DC | PRN

## 2022-02-01 MED ORDER — ONDANSETRON HCL 4 MG/2ML IJ SOLN
INTRAMUSCULAR | Status: AC
  Filled 2022-02-01: qty 2

## 2022-02-01 MED ORDER — DEXAMETHASONE SODIUM PHOSPHATE 10 MG/ML IJ SOLN
INTRAMUSCULAR | Status: DC | PRN
  Administered 2022-02-01: 4 mg via INTRAVENOUS

## 2022-02-01 MED ORDER — EPHEDRINE SULFATE (PRESSORS) 50 MG/ML IJ SOLN
INTRAMUSCULAR | Status: DC | PRN
  Administered 2022-02-01 (×2): 10 mg via INTRAVENOUS
  Administered 2022-02-01: 5 mg via INTRAVENOUS

## 2022-02-01 MED ORDER — FENTANYL CITRATE (PF) 100 MCG/2ML IJ SOLN
INTRAMUSCULAR | Status: DC | PRN
  Administered 2022-02-01: 25 ug via INTRAVENOUS
  Administered 2022-02-01: 50 ug via INTRAVENOUS
  Administered 2022-02-01: 25 ug via INTRAVENOUS

## 2022-02-01 MED ORDER — PROPOFOL 10 MG/ML IV BOLUS
INTRAVENOUS | Status: DC | PRN
  Administered 2022-02-01: 30 mg via INTRAVENOUS
  Administered 2022-02-01: 150 mg via INTRAVENOUS

## 2022-02-01 MED ORDER — ONDANSETRON HCL 4 MG/2ML IJ SOLN
4.0000 mg | Freq: Four times a day (QID) | INTRAMUSCULAR | Status: DC | PRN
  Filled 2022-02-01: qty 2

## 2022-02-01 MED ORDER — MIDAZOLAM HCL 2 MG/2ML IJ SOLN
INTRAMUSCULAR | Status: AC
  Filled 2022-02-01: qty 2

## 2022-02-01 MED ORDER — CHLORHEXIDINE GLUCONATE 0.12 % MT SOLN
OROMUCOSAL | Status: AC
  Administered 2022-02-01: 15 mL
  Filled 2022-02-01: qty 15

## 2022-02-01 MED ORDER — KETAMINE HCL 50 MG/5ML IJ SOSY
PREFILLED_SYRINGE | INTRAMUSCULAR | Status: AC
  Filled 2022-02-01: qty 5

## 2022-02-01 MED ORDER — LACTATED RINGERS IV SOLN: INTRAVENOUS | Status: DC

## 2022-02-01 MED ORDER — SUGAMMADEX SODIUM 500 MG/5ML IV SOLN
INTRAVENOUS | Status: AC
  Filled 2022-02-01: qty 5

## 2022-02-01 MED ORDER — OXYCODONE HCL 5 MG PO TABS
ORAL_TABLET | ORAL | Status: AC
  Filled 2022-02-01: qty 1

## 2022-02-01 MED ORDER — PIPERACILLIN-TAZOBACTAM 3.375 G IVPB
INTRAVENOUS | Status: AC
  Filled 2022-02-01: qty 50

## 2022-02-01 MED ORDER — DEXAMETHASONE SODIUM PHOSPHATE 10 MG/ML IJ SOLN
INTRAMUSCULAR | Status: AC
  Filled 2022-02-01: qty 1

## 2022-02-01 MED ORDER — KETAMINE HCL 10 MG/ML IJ SOLN
INTRAMUSCULAR | Status: DC | PRN
  Administered 2022-02-01: 50 mg via INTRAVENOUS

## 2022-02-01 MED ORDER — OXYCODONE HCL 5 MG PO TABS
5.0000 mg | ORAL_TABLET | Freq: Once | ORAL | Status: AC | PRN
  Administered 2022-02-01: 5 mg via ORAL

## 2022-02-01 MED ORDER — ACETAMINOPHEN 10 MG/ML IV SOLN
INTRAVENOUS | Status: AC
  Filled 2022-02-01: qty 100

## 2022-02-01 MED ORDER — DEXTROSE 5 % IV SOLN
INTRAVENOUS | Status: DC | PRN
  Administered 2022-02-01: 3 g via INTRAVENOUS

## 2022-02-01 MED ORDER — ONDANSETRON HCL 4 MG/2ML IJ SOLN
INTRAMUSCULAR | Status: DC | PRN
  Administered 2022-02-01: 4 mg via INTRAVENOUS

## 2022-02-01 MED ORDER — FENTANYL CITRATE (PF) 100 MCG/2ML IJ SOLN
25.0000 ug | INTRAMUSCULAR | Status: DC | PRN
  Administered 2022-02-01 (×2): 50 ug via INTRAVENOUS

## 2022-02-01 MED ORDER — SUGAMMADEX SODIUM 500 MG/5ML IV SOLN
INTRAVENOUS | Status: DC | PRN
  Administered 2022-02-01: 500 mg via INTRAVENOUS

## 2022-02-01 MED ORDER — ACETAMINOPHEN 10 MG/ML IV SOLN
INTRAVENOUS | Status: DC | PRN
  Administered 2022-02-01: 1000 mg via INTRAVENOUS

## 2022-02-01 MED ORDER — ROCURONIUM BROMIDE 100 MG/10ML IV SOLN
INTRAVENOUS | Status: DC | PRN
  Administered 2022-02-01: 40 mg via INTRAVENOUS
  Administered 2022-02-01: 20 mg via INTRAVENOUS

## 2022-02-01 MED ORDER — PHENYLEPHRINE HCL-NACL 20-0.9 MG/250ML-% IV SOLN
INTRAVENOUS | Status: DC | PRN
  Administered 2022-02-01: 25 ug/min via INTRAVENOUS

## 2022-02-01 SURGICAL SUPPLY — 37 items
BLADE SAW SAG 25.4X90 (BLADE) ×1 IMPLANT
BNDG COHESIVE 4X5 TAN STRL LF (GAUZE/BANDAGES/DRESSINGS) ×1 IMPLANT
BNDG ELASTIC 6X5.8 VLCR NS LF (GAUZE/BANDAGES/DRESSINGS) ×1 IMPLANT
BNDG GAUZE DERMACEA FLUFF 4 (GAUZE/BANDAGES/DRESSINGS) ×2 IMPLANT
BRUSH SCRUB EZ  4% CHG (MISCELLANEOUS) ×1
BRUSH SCRUB EZ 4% CHG (MISCELLANEOUS) ×1 IMPLANT
CHLORAPREP W/TINT 26 (MISCELLANEOUS) ×1 IMPLANT
DRAPE INCISE IOBAN 66X45 STRL (DRAPES) IMPLANT
ELECT CAUTERY BLADE 6.4 (BLADE) ×1 IMPLANT
ELECT REM PT RETURN 9FT ADLT (ELECTROSURGICAL) ×1
ELECTRODE REM PT RTRN 9FT ADLT (ELECTROSURGICAL) ×1 IMPLANT
GAUZE XEROFORM 1X8 LF (GAUZE/BANDAGES/DRESSINGS) ×2 IMPLANT
GLOVE BIO SURGEON STRL SZ7 (GLOVE) ×1 IMPLANT
GOWN STRL REUS W/ TWL LRG LVL3 (GOWN DISPOSABLE) ×1 IMPLANT
GOWN STRL REUS W/ TWL XL LVL3 (GOWN DISPOSABLE) ×1 IMPLANT
GOWN STRL REUS W/TWL LRG LVL3 (GOWN DISPOSABLE) ×1
GOWN STRL REUS W/TWL XL LVL3 (GOWN DISPOSABLE) ×1
HANDLE YANKAUER SUCT BULB TIP (MISCELLANEOUS) ×1 IMPLANT
KIT TURNOVER KIT A (KITS) ×1 IMPLANT
LABEL OR SOLS (LABEL) ×1 IMPLANT
MANIFOLD NEPTUNE II (INSTRUMENTS) ×1 IMPLANT
NS IRRIG 1000ML POUR BTL (IV SOLUTION) ×1 IMPLANT
PACK EXTREMITY ARMC (MISCELLANEOUS) ×1 IMPLANT
PAD ABD DERMACEA PRESS 5X9 (GAUZE/BANDAGES/DRESSINGS) ×2 IMPLANT
PAD PREP 24X41 OB/GYN DISP (PERSONAL CARE ITEMS) ×1 IMPLANT
SPONGE T-LAP 18X18 ~~LOC~~+RFID (SPONGE) ×1 IMPLANT
STAPLER SKIN PROX 35W (STAPLE) ×1 IMPLANT
STOCKINETTE M/LG 89821 (MISCELLANEOUS) ×1 IMPLANT
SUT SILK 2 0 (SUTURE) ×1
SUT SILK 2 0 SH (SUTURE) ×2 IMPLANT
SUT SILK 2-0 18XBRD TIE 12 (SUTURE) ×1 IMPLANT
SUT SILK 3 0 (SUTURE) ×1
SUT SILK 3-0 18XBRD TIE 12 (SUTURE) ×1 IMPLANT
SUT VIC AB 0 CT1 36 (SUTURE) ×2 IMPLANT
SUT VIC AB 2-0 CT1 (SUTURE) ×2 IMPLANT
TRAP FLUID SMOKE EVACUATOR (MISCELLANEOUS) ×1 IMPLANT
WATER STERILE IRR 500ML POUR (IV SOLUTION) ×1 IMPLANT

## 2022-02-01 NOTE — Progress Notes (Signed)
PROGRESS NOTE    [name]  LYY:[id number] DOB: August 11, 1950 DOA: 01/23/2022 PCP: Kirk Ruths, MD    Brief Narrative:  [name] is a 71 y.o. female  with medical history significant for multiple sclerosis,  neurogenic bladder, CKD stage IIIa, subdural hematoma s/p craniotomy in July 2023, peripheral vascular disease, morbid obesity, OSA, hyperlipidemia, COPD, follicular intra-abdominal lymphoma, major depressive disorder, recent hospitalization from 12/22/2021 through 12/26/2021 for management of nonhealing left ulcer and cellulitis of the left lower leg (bone biopsy was negative for osteomyelitis) and discharged on 2-week course of Augmentin.  She was hospitalized again from 9/13 through 01/12/2022 because of pain in the left foot, bloody drainage from the left heel ulcer.  She was seen by the podiatrist on visit.  She was treated with IV Unasyn for 48 hours and was discharged to SNF for local wound care.   She presented to the hospital again on 01/23/2022 because of hypotension and concern for possible sepsis.  She was admitted to the ICU for acute exacerbation of chronic diastolic CHF (BNP 1,275) and septic shock complicated by AKI on CKD stage IIIa with hyperkalemia.  She was treated with empiric IV antibiotics, IV hydrocortisone and vasopressors.  She has been weaned off Levophed.  She was transferred to the hospitalist service on 01/27/2022.  On 10/4 patient became concerned when the nurse tried to put a DNR bracelet on.  She stated she did not want to be a DNR and this was not properly explained to her previously.  Palliative care followed up on 10/4 and explained situation further.  At this time patient remains a full scope.  Also at this time patient is agreeable to amputation.  Vascular surgery consultation is pending.  10/6: Seen in consultation by vascular surgery.  Patient has agreed to amputation at this time.  Plan for OR 10/6  Assessment & Plan:   Principal Problem:    Septic shock ([place]) Active Problems:   Cystitis due to Pseudomonas   Acute renal failure (HCC)   Acute on chronic diastolic CHF (congestive heart failure) (HCC)   Non healing left heel wound   Chronic kidney disease (CKD), stage III (moderate) (HCC)   Multiple sclerosis (HCC)   Obesity, Class III, BMI 40-49.9 (morbid obesity) (HCC)   Hypokalemia   COPD (chronic obstructive pulmonary disease) (HCC)   Hyperkalemia  Body mass index is 50.63 kg/m.  (Morbid obesity)      Septic shock secondary to Pseudomonas UTI and suspected infected left foot ulcer with cellulitis: Appreciate ID involvement.  Antibiotics de-escalated to Zosyn    Left heel decubitus ulcer, left anterior ankle wound, cellulitis left foot: MRI of the left foot is concerning for chronic osteomyelitis involving the posterior aspect of the calcaneus and findings concerning for diabetic myopathy/myositis and left foot cellulitis.    Dr. Amalia Hailey, podiatrist discussed the case with the patient at the bedside.  Dr. Amalia Hailey recommended amputation of the left foot to avoid proximal spread of infection that could potentially lead to severe sepsis and death.  Patient decided to proceed with amputation.  Dr. Lucky Cowboy, vascular surgeon has been consulted.  Ravishankar, ID specialist, has been consulted as well.  As of 10/4 patient is agreeable to surgery.  Vascular surgery consult appreciated.  Discussed with Dr. Lucky Cowboy.  Plan for surgery 10/6   AKI on CKD stage IIIa: She required hemodialysis support for AKI.  Creatinine has improved and hemodialysis has been discontinued.  Follow-up with nephrologist as outpatient  Acute on chronic diastolic CHF, paroxysmal atrial fibrillation: Compensated.  Follow-up with cardiologist as outpatient   Hypokalemia: Monitor and replace as necessary   Hypomagnesemia, monitor and replace as necessary   Worsening anemia of chronic disease, normocytic anemia:  Received 1 unit PRBC on 10/3.  Hemoglobin responded  appropriately.  No further need for transfusion at this time.  Monitor daily H&H   Multiple sclerosis with neurogenic bladder: Continue indwelling Foley catheter.  Will DC with Foley catheter in place   Hyperkalemia and hyponatremia: Resolved     Other comorbidities include COPD, OSA     DVT prophylaxis: SQ heparin Code Status: DNR Family Communication: None today.  Unable to reach husband via phone Disposition Plan: Status is: Inpatient Remains inpatient appropriate because: Lower extremity infection on IV antibiotics.  OR today with vascular surgery for amputation   Level of care: Med-Surg  Consultants:  Vascular surgery Podiatry Palliative care Cardiology  Procedures:  None   Antimicrobials Zosyn   Subjective: Seen and examined.  Flattened affect.  Answers questions intermittently.  No visible distress  Objective: Vitals:   02/01/22 0500 02/01/22 0812 02/01/22 1125 02/01/22 1418  BP:  (!) 154/58 (!) 149/62 (!) 159/64  Pulse:  (!) 59 (!) 57 (!) 58  Resp:  '16 16 16  '$ Temp:  98.5 F (36.9 C) 97.8 F (36.6 C) 98.4 F (36.9 C)  TempSrc:    Oral  SpO2:  96% 97% 96%  Weight: 131.8 kg     Height:        Intake/Output Summary (Last 24 hours) at 02/01/2022 1430 Last data filed at 02/01/2022 0539 Gross per 24 hour  Intake 268.4 ml  Output 875 ml  Net -606.6 ml   Filed Weights   01/25/22 1307 01/30/22 0500 02/01/22 0500  Weight: 133.8 kg 131.2 kg 131.8 kg    Examination:  General exam: No acute distress Respiratory system: Lungs clear.  Normal work of breathing.  Room air Cardiovascular system: S1-S2, RRR, no murmurs, no pedal edema Gastrointestinal system: Soft, NT/ND, normal bowel sounds Central nervous system: Alert and oriented. No focal neurological deficits. Extremities: Left lower extremity with edema, erythematous, wounds with eschar Skin: As above Psychiatry: Judgement and insight appear impaired. Mood & affect flattened.     Data Reviewed: I  have personally reviewed following labs and imaging studies  CBC: Recent Labs  Lab 01/27/22 0420 01/28/22 0550 01/29/22 0611 01/30/22 0040 01/30/22 0423 02/01/22 0758  WBC 8.7 7.4 7.3  --  9.5 9.7  NEUTROABS  --   --   --   --  7.1 7.9*  HGB 7.7* 7.6* 7.3* 8.1* 8.3* 8.7*  HCT 24.2* 24.8* 22.7* 25.7* 26.6* 27.6*  MCV 84.3 87.0 85.3  --  84.4 85.2  PLT 237 222 160  --  172 096   Basic Metabolic Panel: Recent Labs  Lab 01/27/22 0420 01/28/22 0550 01/29/22 0611 01/30/22 0150 01/30/22 0423 02/01/22 0758  NA 136 138 137 137 136 136  K 3.4* 3.4* 3.1* 3.7 3.7 3.7  CL 101 104 104 106 108 106  CO2 '27 27 26 25 24 26  '$ GLUCOSE 83 83 92 106* 89 88  BUN 35* 29* '22 22 22 18  '$ CREATININE 1.85* 1.46* 1.07* 1.11* 1.14* 1.12*  CALCIUM 8.9 8.9 8.8* 8.7* 8.8* 9.1  MG 1.8 1.8 1.6* 1.9 2.0 1.6*  PHOS 3.0 2.9 2.7  --   --   --    GFR: Estimated Creatinine Clearance: 62.2 mL/min (A) (by C-G formula  based on SCr of 1.12 mg/dL (H)). Liver Function Tests: Recent Labs  Lab 01/27/22 0420 01/28/22 0550 01/29/22 0611  ALBUMIN 3.2* 3.1* 3.0*   No results for input(s): "LIPASE", "AMYLASE" in the last 168 hours. No results for input(s): "AMMONIA" in the last 168 hours. Coagulation Profile: No results for input(s): "INR", "PROTIME" in the last 168 hours. Cardiac Enzymes: No results for input(s): "CKTOTAL", "CKMB", "CKMBINDEX", "TROPONINI" in the last 168 hours. BNP (last 3 results) No results for input(s): "PROBNP" in the last 8760 hours. HbA1C: No results for input(s): "HGBA1C" in the last 72 hours. CBG: No results for input(s): "GLUCAP" in the last 168 hours.  Lipid Profile: No results for input(s): "CHOL", "HDL", "LDLCALC", "TRIG", "CHOLHDL", "LDLDIRECT" in the last 72 hours. Thyroid Function Tests: No results for input(s): "TSH", "T4TOTAL", "FREET4", "T3FREE", "THYROIDAB" in the last 72 hours. Anemia Panel: No results for input(s): "VITAMINB12", "FOLATE", "FERRITIN", "TIBC", "IRON",  "RETICCTPCT" in the last 72 hours.  Sepsis Labs: No results for input(s): "PROCALCITON", "LATICACIDVEN" in the last 168 hours.   Recent Results (from the past 240 hour(s))  Blood Culture (routine x 2)     Status: None   Collection Time: 01/23/22  9:37 AM   Specimen: BLOOD  Result Value Ref Range Status   Specimen Description BLOOD RIGHT ANTECUBITAL  Final   Special Requests   Final    BOTTLES DRAWN AEROBIC AND ANAEROBIC Blood Culture adequate volume   Culture   Final    NO GROWTH 6 DAYS Performed at [facility], [address], [place], [place] [address]    Report Status 01/29/2022 FINAL  Final  Blood Culture (routine x 2)     Status: None   Collection Time: 01/23/22  9:37 AM   Specimen: BLOOD  Result Value Ref Range Status   Specimen Description BLOOD LEFT ANTECUBITAL  Final   Special Requests   Final    BOTTLES DRAWN AEROBIC AND ANAEROBIC Blood Culture results may not be optimal due to an inadequate volume of blood received in culture bottles   Culture   Final    NO GROWTH 6 DAYS Performed at [facility], [address], [place], [place] [address]    Report Status 01/29/2022 FINAL  Final  Urine Culture     Status: Abnormal   Collection Time: 01/23/22  9:37 AM   Specimen: In/Out Cath Urine  Result Value Ref Range Status   Specimen Description   Final    IN/OUT CATH URINE Performed at [facility], [address], [place], [place] [address]    Special Requests   Final    NONE Performed at [facility], [address] [place], [place] [address]    Culture >=100,000 COLONIES/mL PSEUDOMONAS AERUGINOSA (A)  Final   Report Status 01/25/2022 FINAL  Final   Organism ID, Bacteria PSEUDOMONAS AERUGINOSA (A)  Final      Susceptibility   Pseudomonas aeruginosa - MIC*    CEFTAZIDIME 2 SENSITIVE Sensitive     CIPROFLOXACIN <=0.25 SENSITIVE Sensitive     GENTAMICIN <=1 SENSITIVE Sensitive     IMIPENEM 2 SENSITIVE Sensitive     PIP/TAZO  <=4 SENSITIVE Sensitive     CEFEPIME 4 SENSITIVE Sensitive     * >=100,000 COLONIES/mL PSEUDOMONAS AERUGINOSA  MRSA Next Gen by PCR, Nasal     Status: None   Collection Time: 01/23/22  4:54 PM   Specimen: Nasal Mucosa; Nasal Swab  Result Value Ref Range Status   MRSA by  PCR Next Gen NOT DETECTED NOT DETECTED Final    Comment: (NOTE) The GeneXpert MRSA Assay (FDA approved for NASAL specimens only), is one component of a comprehensive MRSA colonization surveillance program. It is not intended to diagnose MRSA infection nor to guide or monitor treatment for MRSA infections. Test performance is not FDA approved in patients less than 96 years old. Performed at [facility], [address] [place], [place] [address]   Culture, blood (Routine X 2) w Reflex to ID Panel     Status: None   Collection Time: 01/23/22  5:02 PM   Specimen: BLOOD RIGHT HAND  Result Value Ref Range Status   Specimen Description BLOOD RIGHT HAND  Final   Special Requests   Final    BOTTLES DRAWN AEROBIC AND ANAEROBIC Blood Culture adequate volume   Culture   Final    NO GROWTH 6 DAYS Performed at [facility], [address], [place], [place] [address]    Report Status 01/29/2022 FINAL  Final  Aerobic/Anaerobic Culture w Gram Stain (surgical/deep wound)     Status: None   Collection Time: 01/24/22  5:04 PM   Specimen: Wound  Result Value Ref Range Status   Specimen Description   Final    WOUND Performed at [facility], [place] [address] [place], [place] [address]    Special Requests   Final    LEFT FOOT Performed at [facility], [address] [place], [place] [address]    Gram Stain   Final    NO WBC SEEN FEW GRAM POSITIVE COCCI IN PAIRS FEW GRAM NEGATIVE RODS    Culture   Final    ABUNDANT MULTIPLE ORGANISMS PRESENT, NONE PREDOMINANT NO STAPHYLOCOCCUS AUREUS ISOLATED No Pseudomonas species isolated NO ANAEROBES ISOLATED Performed at [facility],  [name], [place], [place] [address]    Report Status 01/29/2022 FINAL  Final         Radiology Studies: No results found.      Scheduled Meds:  [MAR Hold] vitamin C  500 mg Oral BID   [MAR Hold] buPROPion  300 mg Oral Daily   chlorhexidine       [MAR Hold] Chlorhexidine Gluconate Cloth  6 each Topical Q0600   Chlorhexidine Gluconate Cloth  6 each Topical Once   [MAR Hold] clonazePAM  0.5 mg Oral BID   [MAR Hold] DULoxetine  60 mg Oral BH-q7a   [MAR Hold] feeding supplement (NEPRO CARB STEADY)  237 mL Oral TID BM   [MAR Hold] gabapentin  300 mg Oral QHS   [MAR Hold] heparin  5,000 Units Subcutaneous Q8H   [MAR Hold] multivitamin  1 tablet Oral QHS   [MAR Hold] traZODone  50 mg Oral QHS   [MAR Hold] vortioxetine HBr  5 mg Oral Daily   [MAR Hold] zinc sulfate  220 mg Oral Daily   Continuous Infusions:  [MAR Hold] sodium chloride Stopped (01/29/22 1637)   [MAR Hold] anticoagulant sodium citrate     [MAR Hold] piperacillin-tazobactam (ZOSYN)  IV 3.375 g (02/01/22 0539)     LOS: 9 days     Sidney Ace, MD [facility]   If 7PM-7AM, please contact night-coverage  02/01/2022, 2:30 PM

## 2022-02-01 NOTE — Progress Notes (Addendum)
Date of Admission:  01/23/2022   [name] is a 71 y.o. female with a history of history of MS, neurogenic bladder, chronic foley , treated follicular lymphoma, HTN, lumbar fusion ,immobility   Presents with left heel ulcer of many months and ankle wound which is worsening. Recent hospitalization for the same    Aug 2023  left heel pressure wound  and had undergone debridement and bone biopsy which was neg for osteo and culture rare korcuria and was sent to rehab  on Augmentin   9/13-9/16 back again for the same problem, given IV antibiotics 01/17/22 to the ED because of foul odor, sent on keflex and bactrim   01/23/22 back again with left foot wounds and was hypotensive       ID: [name] is a 71 y.o. female  Principal Problem:   Septic shock ([place]) Active Problems:   Multiple sclerosis ([place])   Obesity, Class III, BMI 40-49.9 (morbid obesity) ([place])   Cystitis due to Pseudomonas   Chronic kidney disease (CKD), stage III (moderate) (HCC)   Hypokalemia   COPD (chronic obstructive pulmonary disease) (HCC)   Non healing left heel wound   Acute renal failure (HCC)   Acute on chronic diastolic CHF (congestive heart failure) (HCC)   Hyperkalemia    Subjective: Had left BKA today Says her pain is okay Had received morphine Husband at bed side  Medications:   [MAR Hold] vitamin C  500 mg Oral BID   [MAR Hold] buPROPion  300 mg Oral Daily   [MAR Hold] Chlorhexidine Gluconate Cloth  6 each Topical Q0600   Chlorhexidine Gluconate Cloth  6 each Topical Once   [MAR Hold] clonazePAM  0.5 mg Oral BID   [MAR Hold] DULoxetine  60 mg Oral BH-q7a   [MAR Hold] feeding supplement (NEPRO CARB STEADY)  237 mL Oral TID BM   [MAR Hold] gabapentin  300 mg Oral QHS   [MAR Hold] heparin  5,000 Units Subcutaneous Q8H   [MAR Hold] multivitamin  1 tablet Oral QHS   [MAR Hold] traZODone  50 mg Oral QHS   [MAR Hold] vortioxetine HBr  5 mg Oral Daily   [MAR Hold] zinc sulfate  220 mg Oral  Daily    Objective: Vital signs in last 24 hours: BP (!) 126/49 (BP Location: Right Arm)   Pulse 62   Temp 98.4 F (36.9 C) (Oral)   Resp 17   Ht '5\' 4"'$  (1.626 m)   Wt 131.8 kg   SpO2 98%   BMI 49.88 kg/m    LDA Foley   PHYSICAL EXAM:  General: Alert, cooperative, pale, obese  Rt side of nostril ulcerating nodule -BCC Lungs: b/l air entry Heart: s1s2 Abdomen: Soft, non-tender,not distended. Bowel sounds normal. No masses Extremities: left leg BKA- surgical dressing Left IJ line Skin: No rashes or lesions. Or bruising Lymph: Cervical, supraclavicular normal. Neurologic: did not assess in detail  Lab Results Recent Labs    01/30/22 0423 02/01/22 0758  WBC 9.5 9.7  HGB 8.3* 8.7*  HCT 26.6* 27.6*  NA 136 136  K 3.7 3.7  CL 108 106  CO2 24 26  BUN 22 18  CREATININE 1.14* 1.12*     Assessment/Plan: Sepsis - due to wounds- resolved ? Multiple wounds left foot covered with eschar- s/p Rt BKA On zosyn, may be able to Discontinue in 72 hrs  Junctional bradycardia-/sinus bradycardia. Seen by cardiologist-     AKI on CKD-  needed a  few days of HD- resolved  Hyperkalemiai on admission- resolved- now hypokalemia   Anemia   MS   Discussed the management with the patient and husband  ID will follow her peripherally this weekend Call [facility] if needed

## 2022-02-01 NOTE — Anesthesia Postprocedure Evaluation (Signed)
Anesthesia Post Note  Patient: [name]  Procedure(s) Performed: AMPUTATION BELOW KNEE (Left: Knee)  Patient location during evaluation: PACU Anesthesia Type: General Level of consciousness: awake and alert, oriented and patient cooperative Pain management: pain level controlled Vital Signs Assessment: post-procedure vital signs reviewed and stable Respiratory status: spontaneous breathing, nonlabored ventilation and respiratory function stable Cardiovascular status: blood pressure returned to baseline and stable Postop Assessment: adequate PO intake Anesthetic complications: no   No notable events documented.   Last Vitals:  Vitals:   02/01/22 1655 02/01/22 1700  BP:  117/88  Pulse: 65 62  Resp: 17 13  Temp:    SpO2: 100% 96%    Last Pain:  Vitals:   02/01/22 1647  TempSrc:   PainSc: [name]

## 2022-02-01 NOTE — Anesthesia Procedure Notes (Signed)
Procedure Name: Intubation Date/Time: 02/01/2022 2:59 PM  Performed by: Lorie Apley, CRNAPre-anesthesia Checklist: Patient identified, Patient being monitored, Timeout performed, Emergency Drugs available and Suction available Patient Re-evaluated:Patient Re-evaluated prior to induction Oxygen Delivery Method: Circle system utilized Preoxygenation: Pre-oxygenation with 100% oxygen Induction Type: IV induction Ventilation: Mask ventilation without difficulty Laryngoscope Size: 3 and McGraph Grade View: Grade III Tube type: Oral Tube size: 7.0 mm Number of attempts: 1 Airway Equipment and Method: Stylet and Bougie stylet Placement Confirmation: ETT inserted through vocal cords under direct vision, positive ETCO2 and breath sounds checked- equal and bilateral Secured at: 23 cm Tube secured with: Tape Dental Injury: Teeth and Oropharynx as per pre-operative assessment

## 2022-02-01 NOTE — Treatment Plan (Signed)
Patient underwent below knee amputation today. Podiatry will sign off.   Lanae Crumbly, DPM 02/01/2022

## 2022-02-01 NOTE — Interval H&P Note (Signed)
History and Physical Interval Note:  02/01/2022 1:56 PM  [name]  has presented today for surgery, with the diagnosis of Vascular disease.  The various methods of treatment have been discussed with the patient and family. After consideration of risks, benefits and other options for treatment, the patient has consented to  Procedure(s): AMPUTATION BELOW KNEE (Left) as a surgical intervention.  The patient's history has been reviewed, patient examined, no change in status, stable for surgery.  I have reviewed the patient's chart and labs.  Questions were answered to the patient's satisfaction.     Leotis Pain

## 2022-02-01 NOTE — Anesthesia Preprocedure Evaluation (Addendum)
Anesthesia Evaluation  Patient identified by MRN, date of birth, ID band Patient awake    Reviewed: Allergy & Precautions, NPO status , Patient's Chart, lab work & pertinent test results  History of Anesthesia Complications Negative for: history of anesthetic complications  Airway Mallampati: I   Neck ROM: Full    Dental  (+) Poor Dentition   Pulmonary sleep apnea , COPD, former smoker (quit 2017),    Pulmonary exam normal breath sounds clear to auscultation       Cardiovascular hypertension, + Peripheral Vascular Disease and +CHF  Normal cardiovascular exam Rhythm:Regular Rate:Normal  ECG 01/31/22:  Sinus bradycardia with 1st degree A-V block Low voltage QRS Septal infarct , age undetermined   Neuro/Psych  Headaches, PSYCHIATRIC DISORDERS (ADHD) Anxiety Depression Subdural hematoma s/p craniotomy in July 2023  Neuromuscular disease (multiple sclerosis)    GI/Hepatic negative GI ROS,   Endo/Other  Class 3 obesity  Renal/GU Renal disease (stage III CKD)   Neurogenic bladder with indwelling catheter    Musculoskeletal   Abdominal   Peds  Hematology  (+) Blood dyscrasia, anemia , Follicular lymphoma   Anesthesia Other Findings   Reproductive/Obstetrics                            Anesthesia Physical Anesthesia Plan  ASA: 4  Anesthesia Plan: General   Post-op Pain Management:    Induction: Intravenous  PONV Risk Score and Plan: 3 and Ondansetron, Dexamethasone and Treatment may vary due to age or medical condition  Airway Management Planned: Oral ETT  Additional Equipment:   Intra-op Plan:   Post-operative Plan: Extubation in OR  Informed Consent: I have reviewed the patients History and Physical, chart, labs and discussed the procedure including the risks, benefits and alternatives for the proposed anesthesia with the patient or authorized representative who has indicated  his/her understanding and acceptance.     Dental advisory given  Plan Discussed with: CRNA  Anesthesia Plan Comments: (Anesthetic considerations for multiple sclerosis: closely monitor body temperature to avoid increase above baseline, avoid succinylcholine, pt may have altered sensitivity to NDMRs.  Patient consented for risks of anesthesia including but not limited to:  - adverse reactions to medications - damage to eyes, teeth, lips or other oral mucosa - nerve damage due to positioning  - sore throat or hoarseness - damage to heart, brain, nerves, lungs, other parts of body or loss of life  Informed patient about role of CRNA in peri- and intra-operative care.  Patient voiced understanding.)        Anesthesia Quick Evaluation

## 2022-02-01 NOTE — Op Note (Signed)
OPERATIVE NOTE   PROCEDURE: Left below-the-knee amputation  PRE-OPERATIVE DIAGNOSIS: Left foot infection, non-salvageable  POST-OPERATIVE DIAGNOSIS: same as above  SURGEON: Leotis Pain, MD  ASSISTANT(S): Annalee Genta, NP  ANESTHESIA: general  ESTIMATED BLOOD LOSS: 200 cc  FINDING(S): none  SPECIMEN(S):  Left below-the-knee amputation  INDICATIONS:   [name] is a 71 y.o. female who presents with left leg foot infection.  The patient is scheduled for a left below-the-knee amputation.  I discussed in depth with the patient the risks, benefits, and alternatives to this procedure.  The patient is aware that the risk of this operation included but are not limited to:  bleeding, infection, myocardial infarction, stroke, death, failure to heal amputation wound, and possible need for more proximal amputation.  The patient is aware of the risks and agrees proceed forward with the procedure.  DESCRIPTION:  After full informed written consent was obtained from the patient, the patient was brought back to the operating room, and placed supine upon the operating table.  Prior to induction, the patient received IV antibiotics.  The patient was then prepped and draped in the standard fashion for a below-the-knee amputation.  After obtaining adequate anesthesia, the patient was prepped and draped in the standard fashion for a left below-the-knee amputation.  I marked out the anterior incision two finger breadths below the tibial tuberosity and then the marked out a posterior flap that was one third of the circumference of the calf in length.   I made the incisions for these flaps, and then dissected through the subcutaneous tissue, fascia, and muscle anteriorly.  I elevated  the periosteal tissue superiorly so that the tibia was about 3-4 cm shorter than the anterior skin flap.  I then transected the tibia with a power saw and then took a wedge off the tibia anteriorly with the power saw.  Then I  smoothed out the rough edges.  In a similar fashion, I cut back the fibula about two centimeters higher than the level of the tibia with a bone cutter.  I put a bone hook into the distal tibia and then used a large amputation knife to sharply develop a tissue plane through the muscle along the fibula.  In such fashion, the posterior flap was developed.  At this point, the specimen was passed off the field as the below-the-knee amputation.  At this point, I clamped all visibly bleeding arteries and veins using a combination of suture ligation with Silk suture and electrocautery.  Bleeding continued to be controlled with electrocautery and suture ligature.  The stump was washed off with sterile normal saline and no further active bleeding was noted.  I reapproximated the anterior and posterior fascia  with interrupted stitches of 0 Vicryl.  This was completed along the entire length of anterior and posterior fascia until there were no more loose space in the fascial line. I then placed a layer of 2-0 Vicryl sutures in the subcutaneous tissue. The skin was then  reapproximated with staples and several interrupted 2-0 nylon sutures due to her edema and poor skin.  The stump was washed off and dried.  The incision was dressed with Xeroform and  then fluffs were applied.  Kerlix was wrapped around the leg and then gently an ACE wrap was applied.    COMPLICATIONS: none  CONDITION: stable   Leotis Pain  02/01/2022, 4:24 PM    This note was created with Dragon Medical transcription system. Any errors in dictation are purely unintentional.

## 2022-02-01 NOTE — Transfer of Care (Signed)
Immediate Anesthesia Transfer of Care Note  Patient: ROXANA LAI  Procedure(s) Performed: AMPUTATION BELOW KNEE (Left: Knee)  Patient Location: PACU  Anesthesia Type:General  Level of Consciousness: drowsy and patient cooperative  Airway & Oxygen Therapy: Patient Spontanous Breathing and Patient connected to face mask oxygen  Post-op Assessment: Report given to RN and Post -op Vital signs reviewed and stable  Post vital signs: Reviewed and stable  Last Vitals:  Vitals Value Taken Time  BP 127/65 02/01/22 1647  Temp    Pulse 61 02/01/22 1649  Resp 12 02/01/22 1649  SpO2 100 % 02/01/22 1649  Vitals shown include unvalidated device data.  Last Pain:  Vitals:   02/01/22 1418  TempSrc: Oral  PainSc: 8       Patients Stated Pain Goal: 0 (49/75/30 0511)  Complications: No notable events documented.

## 2022-02-02 ENCOUNTER — Encounter: Payer: Self-pay | Admitting: Vascular Surgery

## 2022-02-02 DIAGNOSIS — A419 Sepsis, unspecified organism: Secondary | ICD-10-CM | POA: Diagnosis not present

## 2022-02-02 DIAGNOSIS — R6521 Severe sepsis with septic shock: Secondary | ICD-10-CM | POA: Diagnosis not present

## 2022-02-02 LAB — HEMOGLOBIN AND HEMATOCRIT, BLOOD
HCT: 21.2 % — ABNORMAL LOW (ref 36.0–46.0)
HCT: 23.8 % — ABNORMAL LOW (ref 36.0–46.0)
Hemoglobin: 6.6 g/dL — ABNORMAL LOW (ref 12.0–15.0)
Hemoglobin: 7.6 g/dL — ABNORMAL LOW (ref 12.0–15.0)

## 2022-02-02 LAB — CBC
HCT: 20.7 % — ABNORMAL LOW (ref 36.0–46.0)
Hemoglobin: 6.6 g/dL — ABNORMAL LOW (ref 12.0–15.0)
MCH: 27.3 pg (ref 26.0–34.0)
MCHC: 31.9 g/dL (ref 30.0–36.0)
MCV: 85.5 fL (ref 80.0–100.0)
Platelets: 161 10*3/uL (ref 150–400)
RBC: 2.42 MIL/uL — ABNORMAL LOW (ref 3.87–5.11)
RDW: 14.9 % (ref 11.5–15.5)
WBC: 10.6 10*3/uL — ABNORMAL HIGH (ref 4.0–10.5)
nRBC: 0 % (ref 0.0–0.2)

## 2022-02-02 LAB — LACTIC ACID, PLASMA
Lactic Acid, Venous: 1.5 mmol/L (ref 0.5–1.9)
Lactic Acid, Venous: 1.5 mmol/L (ref 0.5–1.9)

## 2022-02-02 LAB — PREPARE RBC (CROSSMATCH)

## 2022-02-02 MED ORDER — LACTATED RINGERS IV BOLUS
1000.0000 mL | Freq: Once | INTRAVENOUS | Status: AC
  Administered 2022-02-02: 1000 mL via INTRAVENOUS

## 2022-02-02 MED ORDER — MIDODRINE HCL 5 MG PO TABS
10.0000 mg | ORAL_TABLET | Freq: Three times a day (TID) | ORAL | Status: DC
  Administered 2022-02-02: 10 mg via ORAL
  Filled 2022-02-02: qty 2

## 2022-02-02 MED ORDER — SODIUM CHLORIDE 0.9% IV SOLUTION: Freq: Once | INTRAVENOUS | Status: AC

## 2022-02-02 MED ORDER — GABAPENTIN 300 MG PO CAPS
300.0000 mg | ORAL_CAPSULE | Freq: Three times a day (TID) | ORAL | Status: DC
  Administered 2022-02-02 – 2022-02-05 (×10): 300 mg via ORAL
  Filled 2022-02-02 (×11): qty 1

## 2022-02-02 NOTE — Evaluation (Addendum)
Physical Therapy Re-Evaluation Patient Details Name: [name] MRN: [id number] DOB: 1950-09-01 Today's Date: 02/02/2022  History of Present Illness  [name] is a 71 y.o. female with PMH including MS, COPD, follicular lymphoma, morbid obesity, PVD with lower extremity ulceration, depression / anxiety, legally blind, admitted on 01/23/2022 with  septic shock secondary to left foot infection and renal failure necessitating renal replacement therapy. Pt underwent L BKA on 02/01/22.  Clinical Impression  Pt seen for PT evaluation with co-tx with OT for pt & therapists' safety; MD cleared pt for participation (max mobility to get to recliner) in setting of low Hgb & awaiting blood transfusion. Pt received in bed, not feeling great & noted to have low BP (82/49 mmHg MAP 55, HR 55 bpm) so no OOB mobility attempted. PT & OT assisted pt with transitioning from semi fowler position to long sitting to allow pt to perform desensitization technique to L residual limb after PT educated pt on purpose & frequency. Pt also performed L knee quad set with multimodal cuing for technique; encouraged pt to perform these throughout the day. Pt is able to scoot to [facility] with bed flat & use of bed rails with cuing for technique. Pt would benefit from STR upon d/c to maximize independence with functional mobility & reduce fall risk prior to return home. Will continue to follow pt acutely to address strengthening, balance, bed mobility, and transfers.   Recommendations for follow up therapy are one component of a multi-disciplinary discharge planning process, led by the attending physician.  Recommendations may be updated based on patient status, additional functional criteria and insurance authorization.  Follow Up Recommendations Skilled nursing-short term rehab (<3 hours/day) Can patient physically be transported by private vehicle: No    Assistance Recommended at Discharge Frequent or constant Supervision/Assistance   Patient can return home with the following  Two people to help with walking and/or transfers;Two people to help with bathing/dressing/bathroom    Equipment Recommendations None recommended by PT (TBD in next venue)  Recommendations for Other Services       Functional Status Assessment Patient has had a recent decline in their functional status and demonstrates the ability to make significant improvements in function in a reasonable and predictable amount of time.     Precautions / Restrictions Precautions Precautions: Fall Restrictions Weight Bearing Restrictions: Yes LLE Weight Bearing: Non weight bearing      Mobility  Bed Mobility               General bed mobility comments: min assist +2 to scoot to [facility] with bed flat with cuing for technique & use of bed rails    Transfers                        Ambulation/Gait                  Stairs            Wheelchair Mobility    Modified Rankin (Stroke Patients Only)       Balance                                             Pertinent Vitals/Pain Pain Assessment Pain Assessment: Faces Faces Pain Scale: Hurts little more Pain Location: L residual limb Pain Descriptors / Indicators: Discomfort, Grimacing Pain Intervention(s): Monitored  during session    [place] expects to be discharged to:: Private residence Living Arrangements: Spouse/significant other Available Help at Discharge: Family;Available 24 hours/day Type of Home: House Home Access: Stairs to enter;Ramped entrance Entrance Stairs-Rails: None Entrance Stairs-Number of Steps: has a ramp, then up two more steps   Home Layout: One level Home Equipment: IT sales professional (4 wheels);Shower seat;BSC/3in1 Additional Comments: Pt has [place] aide that helps with shower 2x/wk. Husband rolls her in wheelchair in home to get to bathroom and lift chair. She sleeps in lift chair. Was having  HHPT, OT, RN prior to this admission. Pt reports WC is in disrepair with breaks that do not function.    Prior Function Prior Level of Function : Needs assist       Physical Assist : Mobility (physical);ADLs (physical) Mobility (physical): Transfers ADLs (physical): Dressing;Bathing;IADLs Mobility Comments: Pt reports limited mobility since last admission. Per chart has not been ambulatory in at least 1 month. Was having PT/OT at home to progress mobility. ADLs Comments: Generally requries assistance for most ADL management from her spouse/HH aide. Pt endorses transferring to [facility] with assistance from spouse.     Hand Dominance   Dominant Hand: Right    Extremity/Trunk Assessment   Upper Extremity Assessment Upper Extremity Assessment: Generalized weakness    Lower Extremity Assessment Lower Extremity Assessment: Generalized weakness LLE Deficits / Details: Pt able to perform L quad set, unable to lift residual limb against gravity without assistnace. Bleeding noted from distal end of residual limb.       Communication   Communication: No difficulties  Cognition Arousal/Alertness: Awake/alert Behavior During Therapy: Flat affect Overall Cognitive Status: Within Functional Limits for tasks assessed                                          General Comments      Exercises General Exercises - Lower Extremity Quad Sets: AROM, Strengthening, Left, 5 reps, Supine   Assessment/Plan    PT Assessment Patient needs continued PT services  PT Problem List Decreased strength;Decreased mobility;Decreased activity tolerance;Decreased balance;Pain;Decreased skin integrity;Obesity;Decreased range of motion;Impaired sensation;Decreased knowledge of use of DME;Decreased safety awareness;Decreased knowledge of precautions       PT Treatment Interventions Functional mobility training;Therapeutic activities;Patient/family education;Therapeutic exercise;Wheelchair mobility  training;Gait training;DME instruction;Stair training;Modalities;Neuromuscular re-education;Manual techniques;Balance training    PT Goals (Current goals can be found in the Care Plan section)  Acute Rehab PT Goals Patient Stated Goal: feel better PT Goal Formulation: With patient Time For Goal Achievement: 02/16/22 Potential to Achieve Goals: Fair    Frequency 7X/week     Co-evaluation PT/OT/SLP Co-Evaluation/Treatment: Yes Reason for Co-Treatment: For patient/therapist safety;To address functional/ADL transfers PT goals addressed during session: Mobility/safety with mobility         AM-PAC PT "6 Clicks" Mobility  Outcome Measure Help needed turning from your back to your side while in a flat bed without using bedrails?: Total Help needed moving from lying on your back to sitting on the side of a flat bed without using bedrails?: Total Help needed moving to and from a bed to a chair (including a wheelchair)?: Total Help needed standing up from a chair using your arms (e.g., wheelchair or bedside chair)?: Total Help needed to walk in hospital room?: Total Help needed climbing 3-5 steps with a railing? : Total 6 Click Score: 6    End of  Session   Activity Tolerance: Treatment limited secondary to medical complications (Comment) Patient left: in bed;with call bell/phone within reach;with bed alarm set Nurse Communication: Mobility status (BP) PT Visit Diagnosis: Other abnormalities of gait and mobility (R26.89);Muscle weakness (generalized) (M62.81);Pain Pain - Right/Left: Left Pain - part of body: Leg    Time: 7847-8412 PT Time Calculation (min) (ACUTE ONLY): 17 min   Charges:   PT Evaluation $PT Re-evaluation: 1 Re-eval          Lavone Nian, PT, DPT 02/02/22, 3:25 PM   Waunita Schooner 02/02/2022, 2:16 PM

## 2022-02-02 NOTE — Progress Notes (Signed)
PROGRESS NOTE    [name]  MPN:[id number] DOB: 01-14-1951 DOA: 01/23/2022 PCP: Kirk Ruths, MD    Brief Narrative:  [name] is a 71 y.o. female  with medical history significant for multiple sclerosis,  neurogenic bladder, CKD stage IIIa, subdural hematoma s/p craniotomy in July 2023, peripheral vascular disease, morbid obesity, OSA, hyperlipidemia, COPD, follicular intra-abdominal lymphoma, major depressive disorder, recent hospitalization from 12/22/2021 through 12/26/2021 for management of nonhealing left ulcer and cellulitis of the left lower leg (bone biopsy was negative for osteomyelitis) and discharged on 2-week course of Augmentin.  She was hospitalized again from 9/13 through 01/12/2022 because of pain in the left foot, bloody drainage from the left heel ulcer.  She was seen by the podiatrist on visit.  She was treated with IV Unasyn for 48 hours and was discharged to SNF for local wound care.   She presented to the hospital again on 01/23/2022 because of hypotension and concern for possible sepsis.  She was admitted to the ICU for acute exacerbation of chronic diastolic CHF (BNP 0,086) and septic shock complicated by AKI on CKD stage IIIa with hyperkalemia.  She was treated with empiric IV antibiotics, IV hydrocortisone and vasopressors.  She has been weaned off Levophed.  She was transferred to the hospitalist service on 01/27/2022.  On 10/4 patient became concerned when the nurse tried to put a DNR bracelet on.  She stated she did not want to be a DNR and this was not properly explained to her previously.  Palliative care followed up on 10/4 and explained situation further.  At this time patient remains a full scope.  Also at this time patient is agreeable to amputation.  Vascular surgery consultation is pending.  10/6: Seen in consultation by vascular surgery.  Patient has agreed to amputation at this time.  Plan for OR 10/6  10/11: Status post BKA.  Tolerated procedure  well.  Postoperative pain moderate.  Hemoglobin 6.6 on postoperative day #1.  Assessment & Plan:   Principal Problem:   Septic shock ([place]) Active Problems:   Cystitis due to Pseudomonas   Acute renal failure (HCC)   Acute on chronic diastolic CHF (congestive heart failure) (HCC)   Non healing left heel wound   Chronic kidney disease (CKD), stage III (moderate) (HCC)   Multiple sclerosis (HCC)   Obesity, Class III, BMI 40-49.9 (morbid obesity) (HCC)   Hypokalemia   COPD (chronic obstructive pulmonary disease) (HCC)   Hyperkalemia  Body mass index is 50.63 kg/m.  (Morbid obesity)      Septic shock secondary to Pseudomonas UTI and suspected infected left foot ulcer with cellulitis: Appreciate ID involvement.  Antibiotics de-escalated to Zosyn.  Continue this   Left heel decubitus ulcer, left anterior ankle wound, cellulitis left foot: MRI of the left foot is concerning for chronic osteomyelitis involving the posterior aspect of the calcaneus and findings concerning for diabetic myopathy/myositis and left foot cellulitis.    Dr. Amalia Hailey, podiatrist discussed the case with the patient at the bedside.  Dr. Amalia Hailey recommended amputation of the left foot to avoid proximal spread of infection that could potentially lead to severe sepsis and death.  Patient decided to proceed with amputation.  Dr. Lucky Cowboy, vascular surgeon has been consulted.  Ravishankar, ID specialist, has been consulted as well.  As of 10/4 patient is agreeable to surgery.  Vascular surgery consult appreciated.  Discussed with Dr. Lucky Cowboy.  Status post BKA 10/6.  Tolerated procedure well   AKI on  CKD stage IIIa: She required hemodialysis support for AKI.  Creatinine has improved and hemodialysis has been discontinued.  Follow-up with nephrologist as outpatient   Acute on chronic diastolic CHF, paroxysmal atrial fibrillation: Compensated.  Follow-up with cardiologist as outpatient   Hypokalemia: Monitor and replace as necessary    Hypomagnesemia, monitor and replace as necessary   Worsening anemia of chronic disease, normocytic anemia Postoperative blood loss anemia  Received 1 unit PRBC on 10/3.  Hemoglobin responded appropriately.   Hemoglobin 6.6 on postoperative day #1 Plan: Transfuse 1 unit PRBC Check posttransfusion H&H Goal hemoglobin 8   Multiple sclerosis with neurogenic bladder: Continue indwelling Foley catheter.  Will DC with Foley catheter in place   Hyperkalemia and hyponatremia: Resolved     Other comorbidities include COPD, OSA     DVT prophylaxis: SQ heparin Code Status: DNR Family Communication: None today.  Unable to reach husband via phone Disposition Plan: Status is: Inpatient Remains inpatient appropriate because: Lower extremity infection on IV antibiotics.  Postoperative day #1 status post BKA   Level of care: Med-Surg  Consultants:  Vascular surgery Podiatry Palliative care Cardiology  Procedures:  None   Antimicrobials Zosyn   Subjective: Seen and examined.  Flattened affect.  Answers questions intermittently.  No visible distress  Objective: Vitals:   02/02/22 0812 02/02/22 1203 02/02/22 1206 02/02/22 1243  BP: (!) 132/52 (!) 92/48 (!) 121/38 (!) 91/54  Pulse: (!) 58 (!) 55 (!) 55   Resp: 18     Temp: 98.4 F (36.9 C) 98.8 F (37.1 C)    TempSrc: Oral Oral    SpO2: 98% 92%    Weight:      Height:        Intake/Output Summary (Last 24 hours) at 02/02/2022 1303 Last data filed at 02/02/2022 1018 Gross per 24 hour  Intake 1247.75 ml  Output 800 ml  Net 447.75 ml   Filed Weights   02/01/22 0500 02/01/22 2314 02/02/22 0509  Weight: 131.8 kg 131.5 kg 131 kg    Examination:  General exam: AD Respiratory system: Lungs clear.  Normal work of breathing.  Room air Cardiovascular system: S1-S2, RRR, no murmurs, no pedal edema Gastrointestinal system: Soft, NT/ND, normal bowel sounds Central nervous system: Alert and oriented. No focal neurological  deficits. Extremities: Status post left BKA Skin: As above Psychiatry: Judgement and insight appear impaired. Mood & affect flattened.     Data Reviewed: I have personally reviewed following labs and imaging studies  CBC: Recent Labs  Lab 01/28/22 0550 01/29/22 0611 01/30/22 0040 01/30/22 0423 02/01/22 0758 02/02/22 0816  WBC 7.4 7.3  --  9.5 9.7 10.6*  NEUTROABS  --   --   --  7.1 7.9*  --   HGB 7.6* 7.3* 8.1* 8.3* 8.7* 6.6*  HCT 24.8* 22.7* 25.7* 26.6* 27.6* 20.7*  MCV 87.0 85.3  --  84.4 85.2 85.5  PLT 222 160  --  172 180 856   Basic Metabolic Panel: Recent Labs  Lab 01/27/22 0420 01/28/22 0550 01/29/22 0611 01/30/22 0150 01/30/22 0423 02/01/22 0758  NA 136 138 137 137 136 136  K 3.4* 3.4* 3.1* 3.7 3.7 3.7  CL 101 104 104 106 108 106  CO2 '27 27 26 25 24 26  '$ GLUCOSE 83 83 92 106* 89 88  BUN 35* 29* '22 22 22 18  '$ CREATININE 1.85* 1.46* 1.07* 1.11* 1.14* 1.12*  CALCIUM 8.9 8.9 8.8* 8.7* 8.8* 9.1  MG 1.8 1.8 1.6* 1.9 2.0 1.6*  PHOS 3.0 2.9 2.7  --   --   --    GFR: Estimated Creatinine Clearance: 62 mL/min (A) (by C-G formula based on SCr of 1.12 mg/dL (H)). Liver Function Tests: Recent Labs  Lab 01/27/22 0420 01/28/22 0550 01/29/22 0611  ALBUMIN 3.2* 3.1* 3.0*   No results for input(s): "LIPASE", "AMYLASE" in the last 168 hours. No results for input(s): "AMMONIA" in the last 168 hours. Coagulation Profile: No results for input(s): "INR", "PROTIME" in the last 168 hours. Cardiac Enzymes: No results for input(s): "CKTOTAL", "CKMB", "CKMBINDEX", "TROPONINI" in the last 168 hours. BNP (last 3 results) No results for input(s): "PROBNP" in the last 8760 hours. HbA1C: No results for input(s): "HGBA1C" in the last 72 hours. CBG: No results for input(s): "GLUCAP" in the last 168 hours.  Lipid Profile: No results for input(s): "CHOL", "HDL", "LDLCALC", "TRIG", "CHOLHDL", "LDLDIRECT" in the last 72 hours. Thyroid Function Tests: No results for input(s):  "TSH", "T4TOTAL", "FREET4", "T3FREE", "THYROIDAB" in the last 72 hours. Anemia Panel: No results for input(s): "VITAMINB12", "FOLATE", "FERRITIN", "TIBC", "IRON", "RETICCTPCT" in the last 72 hours.  Sepsis Labs: No results for input(s): "PROCALCITON", "LATICACIDVEN" in the last 168 hours.   Recent Results (from the past 240 hour(s))  MRSA Next Gen by PCR, Nasal     Status: None   Collection Time: 01/23/22  4:54 PM   Specimen: Nasal Mucosa; Nasal Swab  Result Value Ref Range Status   MRSA by PCR Next Gen NOT DETECTED NOT DETECTED Final    Comment: (NOTE) The GeneXpert MRSA Assay (FDA approved for NASAL specimens only), is one component of a comprehensive MRSA colonization surveillance program. It is not intended to diagnose MRSA infection nor to guide or monitor treatment for MRSA infections. Test performance is not FDA approved in patients less than 52 years old. Performed at [facility], [address] [place], [place] [address]   Culture, blood (Routine X 2) w Reflex to ID Panel     Status: None   Collection Time: 01/23/22  5:02 PM   Specimen: BLOOD RIGHT HAND  Result Value Ref Range Status   Specimen Description BLOOD RIGHT HAND  Final   Special Requests   Final    BOTTLES DRAWN AEROBIC AND ANAEROBIC Blood Culture adequate volume   Culture   Final    NO GROWTH 6 DAYS Performed at [facility], [address], [place], [place] [address]    Report Status 01/29/2022 FINAL  Final  Aerobic/Anaerobic Culture w Gram Stain (surgical/deep wound)     Status: None   Collection Time: 01/24/22  5:04 PM   Specimen: Wound  Result Value Ref Range Status   Specimen Description   Final    WOUND Performed at [facility], [place] [address] [place], [place] [address]    Special Requests   Final    LEFT FOOT Performed at [facility], [address] [place], [place] [address]    Gram Stain   Final    NO WBC SEEN FEW GRAM POSITIVE COCCI IN PAIRS FEW  GRAM NEGATIVE RODS    Culture   Final    ABUNDANT MULTIPLE ORGANISMS PRESENT, NONE PREDOMINANT NO STAPHYLOCOCCUS AUREUS ISOLATED No Pseudomonas species isolated NO ANAEROBES ISOLATED Performed at [facility], [address] [place], [place] [address]    Report Status 01/29/2022 FINAL  Final         Radiology Studies: No results found.      Scheduled  Meds:  sodium chloride   Intravenous Once   vitamin C  500 mg Oral BID   buPROPion  300 mg Oral Daily   Chlorhexidine Gluconate Cloth  6 each Topical Q0600   Chlorhexidine Gluconate Cloth  6 each Topical Once   clonazePAM  0.5 mg Oral BID   DULoxetine  60 mg Oral BH-q7a   feeding supplement (NEPRO CARB STEADY)  237 mL Oral TID BM   gabapentin  300 mg Oral TID   heparin  5,000 Units Subcutaneous Q8H   multivitamin  1 tablet Oral QHS   traZODone  50 mg Oral QHS   vortioxetine HBr  5 mg Oral Daily   zinc sulfate  220 mg Oral Daily   Continuous Infusions:  sodium chloride Stopped (01/29/22 1637)   anticoagulant sodium citrate     piperacillin-tazobactam (ZOSYN)  IV 3.375 g (02/02/22 0530)     LOS: 10 days     Sidney Ace, MD [facility]   If 7PM-7AM, please contact night-coverage  02/02/2022, 1:03 PM

## 2022-02-02 NOTE — Evaluation (Signed)
Occupational Therapy Re-Evaluation Patient Details Name: [name] MRN: [id number] DOB: May 16, 1950 Today's Date: 02/02/2022   History of Present Illness [name] is a 71 y.o. female with PMH including MS, COPD, follicular lymphoma, morbid obesity, PVD with lower extremity ulceration, depression / anxiety, legally blind, admitted on 01/23/2022 with  septic shock secondary to left foot infection and renal failure necessitating renal replacement therapy. Pt underwent L BKA on 02/01/22.   Clinical Impression   Chart reviewed, MD ok limited mobility with hemoglobin 6.6. Pt requires MOD A +2 for attempt to long sit in bed, limited tolerance, BP 82/45 (MAP 59) HR 55, RN present to assess. Pt requires SET UP for grooming tasks, education provided on residual limb care. Bloody drainage noted from residual limb, RN present to assess. Pt is able to reposition in bed MIN A+2 for boost with use of bed rails. Pt continues to perform below PLOF, OT continues to recommend discharge to STR to address functional deficits. OT will continue to follow acutely.      Recommendations for follow up therapy are one component of a multi-disciplinary discharge planning process, led by the attending physician.  Recommendations may be updated based on patient status, additional functional criteria and insurance authorization.   Follow Up Recommendations  Skilled nursing-short term rehab (<3 hours/day)    Assistance Recommended at Discharge Intermittent Supervision/Assistance  Patient can return home with the following Assistance with cooking/housework;Help with stairs or ramp for entrance;Assist for transportation;Two people to help with bathing/dressing/bathroom;Two people to help with walking and/or transfers    Functional Status Assessment  Patient has had a recent decline in their functional status and demonstrates the ability to make significant improvements in function in a reasonable and predictable amount of  time.  Equipment Recommendations  Hospital bed    Recommendations for Other Services       Precautions / Restrictions Precautions Precautions: Fall Restrictions Weight Bearing Restrictions: Yes LLE Weight Bearing: Non weight bearing      Mobility Bed Mobility Overal bed mobility: Needs Assistance             General bed mobility comments: MOD A +2 for HOB at 45 degrees to upright sitting in long sit, unable to tolerate; MIN A +2 for boost up bed with use of bed rails    Transfers                          Balance                                           ADL either performed or assessed with clinical judgement   ADL Overall ADL's : Needs assistance/impaired     Grooming: Wash/dry face;Sitting;Set up               Lower Body Dressing: Maximal assistance;Bed level     Toilet Transfer Details (indicate cue type and reason): deferred due to low BP on this date                 Vision Patient Visual Report: No change from baseline       Perception     Praxis      Pertinent Vitals/Pain Pain Assessment Pain Assessment: Faces Faces Pain Scale: Hurts little more Pain Location: L residual limb Pain Descriptors / Indicators: Discomfort, Grimacing Pain Intervention(s): Monitored  during session     Hand Dominance Right   Extremity/Trunk Assessment Upper Extremity Assessment Upper Extremity Assessment: Generalized weakness   Lower Extremity Assessment Lower Extremity Assessment: Generalized weakness;LLE deficits/detail LLE Deficits / Details: L BKA       Communication Communication Communication: No difficulties   Cognition Arousal/Alertness: Awake/alert Behavior During Therapy: Flat affect Overall Cognitive Status: Within Functional Limits for tasks assessed                                       General Comments  MD ok for limited mobility with hemoglobin at 6.6, pt BP 82/49 (MAP 59) HR 55 in  bed, further mobility deferred    Exercises Other Exercises Other Exercises: edu re: role of rehab, importance of continued mobility, desensitiation techniques, residual limb care   Shoulder Instructions      Home Living Family/patient expects to be discharged to:: Private residence Living Arrangements: Spouse/significant other Available Help at Discharge: Family;Available 24 hours/day Type of Home: House Home Access: Stairs to enter;Ramped entrance Entrance Stairs-Number of Steps: has a ramp, then up two more steps Entrance Stairs-Rails: None Home Layout: One level     Bathroom Shower/Tub: Occupational psychologist: Standard Bathroom Accessibility: Yes How Accessible: Accessible via walker;Accessible via wheelchair Home Equipment: Wheelchair - manual;Rollator (4 wheels);Shower seat;BSC/3in1   Additional Comments: Per chart review: Pt has [place] aide that helps with shower 2x/wk. Husband rolls her in wheelchair in home to get to bathroom and lift chair. She sleeps in lift chair. Was having HHPT, OT, RN prior to this admission. Pt reports WC is in disrepair with breaks that do not function.      Prior Functioning/Environment Prior Level of Function : Needs assist       Physical Assist : Mobility (physical);ADLs (physical) Mobility (physical): Transfers ADLs (physical): Dressing;Bathing;IADLs Mobility Comments: Pt reports limited mobility since last admission. Per chart has not been ambulatory in at least 1 month. Was having PT/OT at home to progress mobility. ADLs Comments: Generally requries assistance for most ADL management from her spouse/HH aide. Pt endorses transferring to [facility] with assistance from spouse.        OT Problem List: Decreased strength;Decreased activity tolerance;Decreased safety awareness;Impaired balance (sitting and/or standing);Decreased knowledge of use of DME or AE;Pain;Decreased range of motion;Obesity;Decreased knowledge of precautions       OT Treatment/Interventions: Self-care/ADL training;Therapeutic exercise;Therapeutic activities;Energy conservation;DME and/or AE instruction;Balance training;Patient/family education    OT Goals(Current goals can be found in the care plan section) Acute Rehab OT Goals Patient Stated Goal: to feel stronger OT Goal Formulation: With patient Time For Goal Achievement: 02/16/22 Potential to Achieve Goals: Fair  OT Frequency: Min 2X/week    Co-evaluation PT/OT/SLP Co-Evaluation/Treatment: Yes Reason for Co-Treatment: For patient/therapist safety PT goals addressed during session: Mobility/safety with mobility OT goals addressed during session: ADL's and self-care      AM-PAC OT "6 Clicks" Daily Activity     Outcome Measure Help from another person eating meals?: None Help from another person taking care of personal grooming?: A Little Help from another person toileting, which includes using toliet, bedpan, or urinal?: A Lot Help from another person bathing (including washing, rinsing, drying)?: A Lot Help from another person to put on and taking off regular upper body clothing?: A Lot Help from another person to put on and taking off regular lower body clothing?: A Lot 6 Click Score:  15   End of Session Nurse Communication: Mobility status (vital signs)  Activity Tolerance: Treatment limited secondary to medical complications (Comment) Patient left: in bed;with call bell/phone within reach;with bed alarm set;with nursing/sitter in room  OT Visit Diagnosis: Unsteadiness on feet (R26.81);Muscle weakness (generalized) (M62.81);Pain                Time: 1346-1405 OT Time Calculation (min): 19 min Charges:  OT General Charges $OT Visit: 1 Visit OT Evaluation $OT Re-eval: 1 Re-eval  Shanon Payor, OTD OTR/L  02/02/22, 3:28 PM

## 2022-02-02 NOTE — Progress Notes (Signed)
1 Day Post-Op   Subjective/Chief Complaint: Doing OK. Complains of pain in stump. Per nursing- pain controlled- slept through evening without complaint   Objective: Vital signs in last 24 hours: Temp:  [96.8 F (36 C)-98.5 F (36.9 C)] 98.3 F (36.8 C) (10/07 0518) Pulse Rate:  [55-65] 58 (10/07 0518) Resp:  [9-19] 18 (10/07 0518) BP: (87-159)/(33-88) 138/69 (10/07 0518) SpO2:  [92 %-100 %] 97 % (10/07 0518) Weight:  [131 kg-131.5 kg] 131 kg (10/07 0509) Last BM Date : 02/01/22  Intake/Output from previous day: 10/06 0701 - 10/07 0700 In: 1247.8 [P.O.:320; I.V.:700; IV Piggyback:227.8] Out: 800 [Urine:600; Blood:200] Intake/Output this shift: No intake/output data recorded.  General appearance: alert and no distress Extremities: LEFT BKA_ dressing C/D/I, thigh soft  Lab Results:  Recent Labs    02/01/22 0758  WBC 9.7  HGB 8.7*  HCT 27.6*  PLT 180   BMET Recent Labs    02/01/22 0758  NA 136  K 3.7  CL 106  CO2 26  GLUCOSE 88  BUN 18  CREATININE 1.12*  CALCIUM 9.1   PT/INR No results for input(s): "LABPROT", "INR" in the last 72 hours. ABG No results for input(s): "PHART", "HCO3" in the last 72 hours.  Invalid input(s): "PCO2", "PO2"  Studies/Results: No results found.  Anti-infectives: Anti-infectives (From admission, onward)    Start     Dose/Rate Route Frequency Ordered Stop   02/01/22 1536  piperacillin-tazobactam (ZOSYN) 3.375 GM/50ML IVPB       Note to Pharmacy: Threasa Heads N: cabinet override      02/01/22 1536 02/02/22 0344   01/31/22 0600  ceFAZolin (ANCEF) IVPB 3g/100 mL premix  Status:  Discontinued        3 g 200 mL/hr over 30 Minutes Intravenous On call to O.R. 01/31/22 0201 02/01/22 0559   01/30/22 0600  piperacillin-tazobactam (ZOSYN) IVPB 3.375 g        3.375 g 12.5 mL/hr over 240 Minutes Intravenous Every 8 hours 01/29/22 2346     01/29/22 2200  ceFEPIme (MAXIPIME) 2 g in sodium chloride 0.9 % 100 mL IVPB  Status:   Discontinued        2 g 200 mL/hr over 30 Minutes Intravenous Every 8 hours 01/29/22 1055 01/29/22 1310   01/29/22 1430  vancomycin (VANCOREADY) IVPB 1250 mg/250 mL  Status:  Discontinued        1,250 mg 166.7 mL/hr over 90 Minutes Intravenous Every 24 hours 01/29/22 1055 01/29/22 1442   01/29/22 1400  ceFEPIme (MAXIPIME) 2 g in sodium chloride 0.9 % 100 mL IVPB  Status:  Discontinued        2 g 200 mL/hr over 30 Minutes Intravenous Every 8 hours 01/29/22 1310 01/29/22 2338   01/28/22 1400  vancomycin (VANCOCIN) IVPB 1000 mg/200 mL premix  Status:  Discontinued        1,000 mg 200 mL/hr over 60 Minutes Intravenous Every 24 hours 01/28/22 1244 01/29/22 1055   01/28/22 1200  vancomycin (VANCOCIN) IVPB 1000 mg/200 mL premix  Status:  Discontinued        1,000 mg 200 mL/hr over 60 Minutes Intravenous Every M-W-F (Hemodialysis) 01/25/22 1459 01/28/22 1244   01/28/22 1200  ceFEPIme (MAXIPIME) 2 g in sodium chloride 0.9 % 100 mL IVPB  Status:  Discontinued        2 g 200 mL/hr over 30 Minutes Intravenous Every 12 hours 01/28/22 1024 01/29/22 1055   01/25/22 1000  vancomycin (VANCOCIN) IVPB 1000 mg/200 mL premix  Status:  Discontinued        1,000 mg 200 mL/hr over 60 Minutes Intravenous  Once 01/25/22 0807 01/25/22 1233   01/24/22 1800  ceFEPIme (MAXIPIME) 1 g in sodium chloride 0.9 % 100 mL IVPB  Status:  Discontinued        1 g 200 mL/hr over 30 Minutes Intravenous Every 24 hours 01/24/22 1116 01/28/22 1024   01/24/22 1114  vancomycin (VANCOCIN) IVPB 1000 mg/200 mL premix  Status:  Discontinued        1,000 mg 200 mL/hr over 60 Minutes Intravenous Every Dialysis 01/24/22 1115 01/25/22 1459   01/24/22 1000  metroNIDAZOLE (FLAGYL) IVPB 500 mg  Status:  Discontinued        500 mg 100 mL/hr over 60 Minutes Intravenous Every 12 hours 01/24/22 0830 01/29/22 2338   01/23/22 1015  vancomycin (VANCOREADY) IVPB 2000 mg/400 mL        2,000 mg 200 mL/hr over 120 Minutes Intravenous  Once 01/23/22 1003  01/23/22 1345   01/23/22 1015  piperacillin-tazobactam (ZOSYN) IVPB 3.375 g        3.375 g 100 mL/hr over 30 Minutes Intravenous  Once 01/23/22 1003 01/23/22 1050       Assessment/Plan: s/p Procedure(s): AMPUTATION BELOW KNEE (Left) POD #1 OOB with PT as tolerated Pain control Will change dressing on Monday or Tuesday  LOS: 10 days    Jamesetta So A 02/02/2022

## 2022-02-02 NOTE — Significant Event (Signed)
Notified by bedside RN.  Decreasing BP.  Bloody drainage from stump.  Hb 6.6.  Ordered 1 unit PRBC, pending STAT lactic acid STAT 1L LR bolus Midodrine '10mg'$  PO TID  Frequent BP assessment, low threshold for transfer to higher level  Ralene Muskrat MD

## 2022-02-03 DIAGNOSIS — R6521 Severe sepsis with septic shock: Secondary | ICD-10-CM | POA: Diagnosis not present

## 2022-02-03 DIAGNOSIS — A419 Sepsis, unspecified organism: Secondary | ICD-10-CM | POA: Diagnosis not present

## 2022-02-03 LAB — HEMOGLOBIN AND HEMATOCRIT, BLOOD
HCT: 23.5 % — ABNORMAL LOW (ref 36.0–46.0)
Hemoglobin: 7.4 g/dL — ABNORMAL LOW (ref 12.0–15.0)

## 2022-02-03 LAB — BPAM RBC
Blood Product Expiration Date: 202311032359
ISSUE DATE / TIME: 202310071511
Unit Type and Rh: 6200

## 2022-02-03 LAB — CBC WITH DIFFERENTIAL/PLATELET
Abs Immature Granulocytes: 0.05 10*3/uL (ref 0.00–0.07)
Basophils Absolute: 0.1 10*3/uL (ref 0.0–0.1)
Basophils Relative: 1 %
Eosinophils Absolute: 0.2 10*3/uL (ref 0.0–0.5)
Eosinophils Relative: 2 %
HCT: 23 % — ABNORMAL LOW (ref 36.0–46.0)
Hemoglobin: 7.4 g/dL — ABNORMAL LOW (ref 12.0–15.0)
Immature Granulocytes: 1 %
Lymphocytes Relative: 11 %
Lymphs Abs: 1 10*3/uL (ref 0.7–4.0)
MCH: 27.6 pg (ref 26.0–34.0)
MCHC: 32.2 g/dL (ref 30.0–36.0)
MCV: 85.8 fL (ref 80.0–100.0)
Monocytes Absolute: 0.9 10*3/uL (ref 0.1–1.0)
Monocytes Relative: 11 %
Neutro Abs: 6.6 10*3/uL (ref 1.7–7.7)
Neutrophils Relative %: 74 %
Platelets: 163 10*3/uL (ref 150–400)
RBC: 2.68 MIL/uL — ABNORMAL LOW (ref 3.87–5.11)
RDW: 15.6 % — ABNORMAL HIGH (ref 11.5–15.5)
WBC: 8.8 10*3/uL (ref 4.0–10.5)
nRBC: 0 % (ref 0.0–0.2)

## 2022-02-03 LAB — BASIC METABOLIC PANEL
Anion gap: 8 (ref 5–15)
BUN: 25 mg/dL — ABNORMAL HIGH (ref 8–23)
CO2: 24 mmol/L (ref 22–32)
Calcium: 9.1 mg/dL (ref 8.9–10.3)
Chloride: 102 mmol/L (ref 98–111)
Creatinine, Ser: 1.65 mg/dL — ABNORMAL HIGH (ref 0.44–1.00)
GFR, Estimated: 33 mL/min — ABNORMAL LOW (ref >60)
Glucose, Bld: 93 mg/dL (ref 70–99)
Potassium: 3.6 mmol/L (ref 3.5–5.1)
Sodium: 134 mmol/L — ABNORMAL LOW (ref 135–145)

## 2022-02-03 LAB — TYPE AND SCREEN
ABO/RH(D): A POS
Antibody Screen: NEGATIVE
Unit division: 0

## 2022-02-03 MED ORDER — MIDODRINE HCL 5 MG PO TABS
5.0000 mg | ORAL_TABLET | Freq: Three times a day (TID) | ORAL | Status: DC
  Administered 2022-02-03 – 2022-02-05 (×7): 5 mg via ORAL
  Filled 2022-02-03 (×8): qty 1

## 2022-02-03 MED ORDER — IPRATROPIUM-ALBUTEROL 0.5-2.5 (3) MG/3ML IN SOLN: 3.0000 mL | RESPIRATORY_TRACT | Status: DC | PRN

## 2022-02-03 NOTE — Progress Notes (Signed)
Occupational Therapy Treatment Patient Details Name: [name] MRN: [id number] DOB: [birth year] Today's Date: 02/03/2022   History of present illness [name] is a 71 y.o. female with PMH including MS, COPD, follicular lymphoma, morbid obesity, PVD with lower extremity ulceration, depression / anxiety, legally blind, admitted on 01/23/2022 with  septic shock secondary to left foot infection and renal failure necessitating renal replacement therapy. Pt underwent L BKA on 02/01/22.   OT comments  Chart reviewed, pt greeted in bed agreeable to OT intervention. Co tx completed with PT on this date. Tx session targeted improving activity tolerance, sitting balance for ADL tasks, including seated grooming tasks. Improvements noted in tolerance for sitting at edge of bed, with pt tolerating 10-15 minutes, requiring at least MIN A for static sitting. Pt completes seated grooming tasks with MIN A. Continued decreased endurance/strength deficits affect optimal ADL completion. OT will continue to follow acutely.    Recommendations for follow up therapy are one component of a multi-disciplinary discharge planning process, led by the attending physician.  Recommendations may be updated based on patient status, additional functional criteria and insurance authorization.    Follow Up Recommendations  Skilled nursing-short term rehab (<3 hours/day)    Assistance Recommended at Discharge Intermittent Supervision/Assistance  Patient can return home with the following  Assistance with cooking/housework;Help with stairs or ramp for entrance;Assist for transportation;Two people to help with bathing/dressing/bathroom;Two people to help with walking and/or transfers   [facility] bed    Recommendations for Other Services      Precautions / Restrictions Precautions Precautions: Fall Restrictions Weight Bearing Restrictions: Yes LLE Weight Bearing: Non weight bearing        Mobility Bed Mobility Overal bed mobility: Needs Assistance Bed Mobility: Supine to Sit, Sit to Supine, Rolling Rolling: Mod assist, Max assist, +2 for physical assistance   Supine to sit: Max assist, +2 for physical assistance, HOB elevated Sit to supine: Max assist, +2 for physical assistance, +2 for safety/equipment        Transfers                         Balance Overall balance assessment: Needs assistance Sitting-balance support: Bilateral upper extremity supported, Feet supported Sitting balance-Leahy Scale: Poor Sitting balance - Comments: At least MIN A for static sitting on this date Postural control: Posterior lean                                 ADL either performed or assessed with clinical judgement   ADL Overall ADL's : Needs assistance/impaired                                       General ADL Comments: MAX A +2 for bed mobility, grooming tasks seated at edge of bed with MIN A, vcs for optimal posture/positioning    Extremity/Trunk Assessment              Vision       Perception     Praxis      Cognition Arousal/Alertness: Awake/alert Behavior During Therapy: Flat affect Overall Cognitive Status: Within Functional Limits for tasks assessed  Exercises      Shoulder Instructions       General Comments pt declined transfer to chair on this date    Pertinent Vitals/ Pain       Pain Assessment Pain Assessment: Faces Faces Pain Scale: Hurts little more Pain Location: L residual limb Pain Descriptors / Indicators: Discomfort, Grimacing Pain Intervention(s): Limited activity within patient's tolerance, Monitored during session  Home Living                                          Prior Functioning/Environment              Frequency  Min 2X/week        Progress Toward Goals  OT Goals(current goals can now be  found in the care plan section)  Progress towards OT goals: Progressing toward goals     Plan Discharge plan remains appropriate    Co-evaluation    PT/OT/SLP Co-Evaluation/Treatment: Yes Reason for Co-Treatment: Complexity of the patient's impairments (multi-system involvement);For patient/therapist safety PT goals addressed during session: Mobility/safety with mobility;Balance;Strengthening/ROM OT goals addressed during session: ADL's and self-care      AM-PAC OT "6 Clicks" Daily Activity     Outcome Measure   Help from another person eating meals?: None Help from another person taking care of personal grooming?: A Little Help from another person toileting, which includes using toliet, bedpan, or urinal?: A Lot Help from another person bathing (including washing, rinsing, drying)?: A Lot Help from another person to put on and taking off regular upper body clothing?: A Lot Help from another person to put on and taking off regular lower body clothing?: A Lot 6 Click Score: 15    End of Session    OT Visit Diagnosis: Unsteadiness on feet (R26.81);Muscle weakness (generalized) (M62.81);Pain   Activity Tolerance Patient tolerated treatment well   Patient Left in bed;with call bell/phone within reach;with bed alarm set   Nurse Communication          Time: 1856-3149 OT Time Calculation (min): 24 min  Charges: OT General Charges $OT Visit: 1 Visit OT Treatments $Self Care/Home Management : 8-22 mins  Shanon Payor, OTD OTR/L  02/03/22, 1:57 PM

## 2022-02-03 NOTE — Progress Notes (Signed)
2 Days Post-Op   Subjective/Chief Complaint: Complains of LEFT stump pain.   Objective: Vital signs in last 24 hours: Temp:  [98.1 F (36.7 C)-98.8 F (37.1 C)] 98.1 F (36.7 C) (10/08 0800) Pulse Rate:  [39-93] 39 (10/08 0900) Resp:  [14-25] 19 (10/08 0900) BP: (85-139)/(38-68) 139/68 (10/08 0900) SpO2:  [92 %-100 %] 95 % (10/08 0900) Last BM Date : 02/01/22  Intake/Output from previous day: 10/07 0701 - 10/08 0700 In: 700 [Blood:600; IV Piggyback:100] Out: 1550 [Urine:1550] Intake/Output this shift: No intake/output data recorded.  General appearance: alert and no distress Extremities: LEFT stump dressing changed- +edema, incision- intact, clean, serous weeping, no active bleeding noted, stump soft/viable  Lab Results:  Recent Labs    02/02/22 0816 02/02/22 1438 02/03/22 0532 02/03/22 0824  WBC 10.6*  --   --  8.8  HGB 6.6*   < > 7.4* 7.4*  HCT 20.7*   < > 23.5* 23.0*  PLT 161  --   --  163   < > = values in this interval not displayed.   BMET Recent Labs    02/01/22 0758 02/03/22 0824  NA 136 134*  K 3.7 3.6  CL 106 102  CO2 26 24  GLUCOSE 88 93  BUN 18 25*  CREATININE 1.12* 1.65*  CALCIUM 9.1 9.1   PT/INR No results for input(s): "LABPROT", "INR" in the last 72 hours. ABG No results for input(s): "PHART", "HCO3" in the last 72 hours.  Invalid input(s): "PCO2", "PO2"  Studies/Results: No results found.  Anti-infectives: Anti-infectives (From admission, onward)    Start     Dose/Rate Route Frequency Ordered Stop   02/01/22 1536  piperacillin-tazobactam (ZOSYN) 3.375 GM/50ML IVPB       Note to Pharmacy: Threasa Heads N: cabinet override      02/01/22 1536 02/02/22 0344   01/31/22 0600  ceFAZolin (ANCEF) IVPB 3g/100 mL premix  Status:  Discontinued        3 g 200 mL/hr over 30 Minutes Intravenous On call to O.R. 01/31/22 0201 02/01/22 0559   01/30/22 0600  piperacillin-tazobactam (ZOSYN) IVPB 3.375 g        3.375 g 12.5 mL/hr over 240  Minutes Intravenous Every 8 hours 01/29/22 2346     01/29/22 2200  ceFEPIme (MAXIPIME) 2 g in sodium chloride 0.9 % 100 mL IVPB  Status:  Discontinued        2 g 200 mL/hr over 30 Minutes Intravenous Every 8 hours 01/29/22 1055 01/29/22 1310   01/29/22 1430  vancomycin (VANCOREADY) IVPB 1250 mg/250 mL  Status:  Discontinued        1,250 mg 166.7 mL/hr over 90 Minutes Intravenous Every 24 hours 01/29/22 1055 01/29/22 1442   01/29/22 1400  ceFEPIme (MAXIPIME) 2 g in sodium chloride 0.9 % 100 mL IVPB  Status:  Discontinued        2 g 200 mL/hr over 30 Minutes Intravenous Every 8 hours 01/29/22 1310 01/29/22 2338   01/28/22 1400  vancomycin (VANCOCIN) IVPB 1000 mg/200 mL premix  Status:  Discontinued        1,000 mg 200 mL/hr over 60 Minutes Intravenous Every 24 hours 01/28/22 1244 01/29/22 1055   01/28/22 1200  vancomycin (VANCOCIN) IVPB 1000 mg/200 mL premix  Status:  Discontinued        1,000 mg 200 mL/hr over 60 Minutes Intravenous Every M-W-F (Hemodialysis) 01/25/22 1459 01/28/22 1244   01/28/22 1200  ceFEPIme (MAXIPIME) 2 g in sodium chloride 0.9 % 100  mL IVPB  Status:  Discontinued        2 g 200 mL/hr over 30 Minutes Intravenous Every 12 hours 01/28/22 1024 01/29/22 1055   01/25/22 1000  vancomycin (VANCOCIN) IVPB 1000 mg/200 mL premix  Status:  Discontinued        1,000 mg 200 mL/hr over 60 Minutes Intravenous  Once 01/25/22 0807 01/25/22 1233   01/24/22 1800  ceFEPIme (MAXIPIME) 1 g in sodium chloride 0.9 % 100 mL IVPB  Status:  Discontinued        1 g 200 mL/hr over 30 Minutes Intravenous Every 24 hours 01/24/22 1116 01/28/22 1024   01/24/22 1114  vancomycin (VANCOCIN) IVPB 1000 mg/200 mL premix  Status:  Discontinued        1,000 mg 200 mL/hr over 60 Minutes Intravenous Every Dialysis 01/24/22 1115 01/25/22 1459   01/24/22 1000  metroNIDAZOLE (FLAGYL) IVPB 500 mg  Status:  Discontinued        500 mg 100 mL/hr over 60 Minutes Intravenous Every 12 hours 01/24/22 0830 01/29/22 2338    01/23/22 1015  vancomycin (VANCOREADY) IVPB 2000 mg/400 mL        2,000 mg 200 mL/hr over 120 Minutes Intravenous  Once 01/23/22 1003 01/23/22 1345   01/23/22 1015  piperacillin-tazobactam (ZOSYN) IVPB 3.375 g        3.375 g 100 mL/hr over 30 Minutes Intravenous  Once 01/23/22 1003 01/23/22 1050       Assessment/Plan: s/p Procedure(s): AMPUTATION BELOW KNEE (Left) POD #2 Noted drop in HgB- no evidence of active bleeding from stump Continue supportive care PT/OT Follow up in office upon discharge  LOS: 11 days    Jamesetta So A 02/03/2022

## 2022-02-03 NOTE — Progress Notes (Signed)
PROGRESS NOTE    [name]  GQQ:[id number] DOB: December 18, 1950 DOA: 01/23/2022 PCP: Kirk Ruths, MD    Brief Narrative:  [name] is a 71 y.o. female  with medical history significant for multiple sclerosis,  neurogenic bladder, CKD stage IIIa, subdural hematoma s/p craniotomy in July 2023, peripheral vascular disease, morbid obesity, OSA, hyperlipidemia, COPD, follicular intra-abdominal lymphoma, major depressive disorder, recent hospitalization from 12/22/2021 through 12/26/2021 for management of nonhealing left ulcer and cellulitis of the left lower leg (bone biopsy was negative for osteomyelitis) and discharged on 2-week course of Augmentin.  She was hospitalized again from 9/13 through 01/12/2022 because of pain in the left foot, bloody drainage from the left heel ulcer.  She was seen by the podiatrist on visit.  She was treated with IV Unasyn for 48 hours and was discharged to SNF for local wound care.   She presented to the hospital again on 01/23/2022 because of hypotension and concern for possible sepsis.  She was admitted to the ICU for acute exacerbation of chronic diastolic CHF (BNP 6,712) and septic shock complicated by AKI on CKD stage IIIa with hyperkalemia.  She was treated with empiric IV antibiotics, IV hydrocortisone and vasopressors.  She has been weaned off Levophed.  She was transferred to the hospitalist service on 01/27/2022.  On 10/4 patient became concerned when the nurse tried to put a DNR bracelet on.  She stated she did not want to be a DNR and this was not properly explained to her previously.  Palliative care followed up on 10/4 and explained situation further.  At this time patient remains a full scope.  Also at this time patient is agreeable to amputation.  Vascular surgery consultation is pending.  10/6: Seen in consultation by vascular surgery.  Patient has agreed to amputation at this time.  Plan for OR 10/6  10/7: Status post BKA.  Tolerated procedure  well.  Postoperative pain moderate.  Hemoglobin 6.6 on postoperative day #1.  10/8: Continue complaints of pain..  Multimodal pain regimen in place.  No evidence of bleeding from stump.  Assessment & Plan:   Principal Problem:   Septic shock ([place]) Active Problems:   Cystitis due to Pseudomonas   Acute renal failure (HCC)   Acute on chronic diastolic CHF (congestive heart failure) (HCC)   Non healing left heel wound   Chronic kidney disease (CKD), stage III (moderate) (HCC)   Multiple sclerosis (HCC)   Obesity, Class III, BMI 40-49.9 (morbid obesity) (HCC)   Hypokalemia   COPD (chronic obstructive pulmonary disease) (HCC)   Hyperkalemia   Left heel decubitus ulcer, left anterior ankle wound, cellulitis left foot: MRI of the left foot is concerning for chronic osteomyelitis involving the posterior aspect of the calcaneus and findings concerning for diabetic myopathy/myositis and left foot cellulitis.    Dr. Amalia Hailey, podiatrist discussed the case with the patient at the bedside.  Dr. Amalia Hailey recommended amputation of the left foot to avoid proximal spread of infection that could potentially lead to severe sepsis and death.  Patient decided to proceed with amputation.  Dr. Lucky Cowboy, vascular surgeon has been consulted.  Ravishankar, ID specialist, has been consulted as well.  As of 10/4 patient is agreeable to surgery.  Vascular surgery consult appreciated.  Discussed with Dr. Lucky Cowboy.  Status post BKA 10/6.  Tolerated procedure well  Worsening anemia of chronic disease, normocytic anemia Postoperative blood loss anemia  Received 1 unit PRBC on 10/3.  Hemoglobin responded appropriately.  Hemoglobin 6.6 on postoperative day #1 Received 1 unit PRBC.  Hemoglobin responded appropriately Plan: Further transfusion at this time Daily H&H   Body mass index is 50.63 kg/m.  (Morbid obesity)      Septic shock secondary to Pseudomonas UTI and suspected infected left foot ulcer with cellulitis: Appreciate  ID involvement.  Antibiotics de-escalated to Zosyn.  Continue this for now.  ID follow-up Monday 10/9.  May be able to discontinue antibiotics altogether as source control is now been achieved      AKI on CKD stage IIIa: She required hemodialysis support for AKI.  Creatinine has improved and hemodialysis has been discontinued.  Follow-up with nephrologist as outpatient   Acute on chronic diastolic CHF, paroxysmal atrial fibrillation: Compensated.  Follow-up with cardiologist as outpatient   Hypokalemia: Monitor and replace as necessary   Hypomagnesemia, monitor and replace as necessary      Multiple sclerosis with neurogenic bladder: Continue indwelling Foley catheter.  Will DC with Foley catheter in place   Hyperkalemia and hyponatremia: Resolved     Other comorbidities include COPD, OSA     DVT prophylaxis: SQ heparin Code Status: DNR Family Communication: None today.  Unable to reach husband via phone Disposition Plan: Status is: Inpatient Remains inpatient appropriate because: Lower extremity infection on IV antibiotics.  Postoperative day #2 status post BKA   Level of care: Med-Surg  Consultants:  Vascular surgery Podiatry Palliative care Cardiology  Procedures:  None   Antimicrobials Zosyn   Subjective: Seen and examined.  Flattened affect.  Answers questions intermittently.  No visible distress.  Endorses pain in left stump  Objective: Vitals:   02/03/22 0400 02/03/22 0600 02/03/22 0800 02/03/22 0900  BP: (!) 135/48 (!) 133/56 (!) 136/58 139/68  Pulse: 91 (!) 41 (!) 45 (!) 39  Resp: (!) '25 18 15 19  '$ Temp:   98.1 F (36.7 C)   TempSrc:   Oral   SpO2: 95% 96% 95% 95%  Weight:      Height:        Intake/Output Summary (Last 24 hours) at 02/03/2022 1147 Last data filed at 02/03/2022 0600 Gross per 24 hour  Intake 700 ml  Output 1550 ml  Net -850 ml   Filed Weights   02/01/22 0500 02/01/22 2314 02/02/22 0509  Weight: 131.8 kg 131.5 kg 131 kg     Examination:  General exam: No acute distress Respiratory system: Bibasilar crackles.  Normal work of breathing.  Room air Cardiovascular system: S1-S2, RRR, no murmurs, no pedal edema Gastrointestinal system: Soft, NT/ND, normal bowel sounds Central nervous system: Alert and oriented. No focal neurological deficits. Extremities: Status post left BKA Skin: As above Psychiatry: Judgement and insight appear impaired. Mood & affect flattened.     Data Reviewed: I have personally reviewed following labs and imaging studies  CBC: Recent Labs  Lab 01/29/22 0611 01/30/22 0040 01/30/22 0423 02/01/22 0758 02/02/22 0816 02/02/22 1438 02/02/22 2156 02/03/22 0532 02/03/22 0824  WBC 7.3  --  9.5 9.7 10.6*  --   --   --  8.8  NEUTROABS  --   --  7.1 7.9*  --   --   --   --  6.6  HGB 7.3*   < > 8.3* 8.7* 6.6* 6.6* 7.6* 7.4* 7.4*  HCT 22.7*   < > 26.6* 27.6* 20.7* 21.2* 23.8* 23.5* 23.0*  MCV 85.3  --  84.4 85.2 85.5  --   --   --  85.8  PLT 160  --  172 180 161  --   --   --  163   < > = values in this interval not displayed.   Basic Metabolic Panel: Recent Labs  Lab 01/28/22 0550 01/29/22 0611 01/30/22 0150 01/30/22 0423 02/01/22 0758 02/03/22 0824  NA 138 137 137 136 136 134*  K 3.4* 3.1* 3.7 3.7 3.7 3.6  CL 104 104 106 108 106 102  CO2 '27 26 25 24 26 24  '$ GLUCOSE 83 92 106* 89 88 93  BUN 29* '22 22 22 18 '$ 25*  CREATININE 1.46* 1.07* 1.11* 1.14* 1.12* 1.65*  CALCIUM 8.9 8.8* 8.7* 8.8* 9.1 9.1  MG 1.8 1.6* 1.9 2.0 1.6*  --   PHOS 2.9 2.7  --   --   --   --    GFR: Estimated Creatinine Clearance: 42.1 mL/min (A) (by C-G formula based on SCr of 1.65 mg/dL (H)). Liver Function Tests: Recent Labs  Lab 01/28/22 0550 01/29/22 0611  ALBUMIN 3.1* 3.0*   No results for input(s): "LIPASE", "AMYLASE" in the last 168 hours. No results for input(s): "AMMONIA" in the last 168 hours. Coagulation Profile: No results for input(s): "INR", "PROTIME" in the last 168  hours. Cardiac Enzymes: No results for input(s): "CKTOTAL", "CKMB", "CKMBINDEX", "TROPONINI" in the last 168 hours. BNP (last 3 results) No results for input(s): "PROBNP" in the last 8760 hours. HbA1C: No results for input(s): "HGBA1C" in the last 72 hours. CBG: No results for input(s): "GLUCAP" in the last 168 hours.  Lipid Profile: No results for input(s): "CHOL", "HDL", "LDLCALC", "TRIG", "CHOLHDL", "LDLDIRECT" in the last 72 hours. Thyroid Function Tests: No results for input(s): "TSH", "T4TOTAL", "FREET4", "T3FREE", "THYROIDAB" in the last 72 hours. Anemia Panel: No results for input(s): "VITAMINB12", "FOLATE", "FERRITIN", "TIBC", "IRON", "RETICCTPCT" in the last 72 hours.  Sepsis Labs: Recent Labs  Lab 02/02/22 1438 02/02/22 1934  LATICACIDVEN 1.5 1.5     Recent Results (from the past 240 hour(s))  Aerobic/Anaerobic Culture w Gram Stain (surgical/deep wound)     Status: None   Collection Time: 01/24/22  5:04 PM   Specimen: Wound  Result Value Ref Range Status   Specimen Description   Final    WOUND Performed at [facility] [address], [place], [place] [address]    Special Requests   Final    LEFT FOOT Performed at [facility], [address] [place], [place] [address]    Gram Stain   Final    NO WBC SEEN FEW GRAM POSITIVE COCCI IN PAIRS FEW GRAM NEGATIVE RODS    Culture   Final    ABUNDANT MULTIPLE ORGANISMS PRESENT, NONE PREDOMINANT NO STAPHYLOCOCCUS AUREUS ISOLATED No Pseudomonas species isolated NO ANAEROBES ISOLATED Performed at [facility], [address], [place], [place] [address]    Report Status 01/29/2022 FINAL  Final         Radiology Studies: No results found.      Scheduled Meds:  vitamin C  500 mg Oral BID   buPROPion  300 mg Oral Daily   Chlorhexidine Gluconate Cloth  6 each Topical Q0600   Chlorhexidine Gluconate Cloth  6 each Topical Once   clonazePAM  0.5 mg Oral BID   DULoxetine  60 mg Oral  BH-q7a   feeding supplement (NEPRO CARB STEADY)  237 mL Oral TID BM   gabapentin  300 mg Oral TID   heparin  5,000 Units Subcutaneous Q8H   midodrine  5 mg Oral TID WC  multivitamin  1 tablet Oral QHS   traZODone  50 mg Oral QHS   vortioxetine HBr  5 mg Oral Daily   zinc sulfate  220 mg Oral Daily   Continuous Infusions:  sodium chloride Stopped (01/29/22 1637)   anticoagulant sodium citrate     piperacillin-tazobactam (ZOSYN)  IV 3.375 g (02/03/22 0622)     LOS: 11 days     Sidney Ace, MD [facility]   If 7PM-7AM, please contact night-coverage  02/03/2022, 11:47 AM

## 2022-02-03 NOTE — Progress Notes (Signed)
Physical Therapy Treatment Patient Details Name: [name] MRN: [id number] DOB: [birth year] Today's Date: 02/03/2022   History of Present Illness [name] is a 71 y.o. female with PMH including MS, COPD, follicular lymphoma, morbid obesity, PVD with lower extremity ulceration, depression / anxiety, legally blind, admitted on 01/23/2022 with  septic shock secondary to left foot infection and renal failure necessitating renal replacement therapy. Pt underwent L BKA on 02/01/22.    PT Comments    Pt seen for PT tx with co-tx with OT. Pt received in bed, a little irritable but agreeable to tx. Pt requires MAX assist +2 for supine<>sit & up to max assist for static sitting EOB 2/2 posterior lean and decrease core strength. While sitting EOB pt engaged in reaching with 1UE at a time with focus on balance & RLE LAQs. Pt also engaged in grooming tasks (washing face, brushing teeth). Pt visibly fatigued by end of session. PT/OT reiterated importance of densensitization techniques with L residual limb.     Recommendations for follow up therapy are one component of a multi-disciplinary discharge planning process, led by the attending physician.  Recommendations may be updated based on patient status, additional functional criteria and insurance authorization.  Follow Up Recommendations  Skilled nursing-short term rehab (<3 hours/day) Can patient physically be transported by private vehicle: No   Assistance Recommended at Discharge Frequent or constant Supervision/Assistance  Patient can return home with the following Two people to help with walking and/or transfers;Two people to help with bathing/dressing/bathroom   Equipment Recommendations  None recommended by PT (TBD in next venue)    Recommendations for Other Services       Precautions / Restrictions Precautions Precautions: Fall Restrictions Weight Bearing Restrictions: Yes LLE Weight Bearing: Non weight bearing     Mobility  Bed  Mobility Overal bed mobility: Needs Assistance Bed Mobility: Supine to Sit, Sit to Supine, Rolling Rolling: Mod assist, Max assist, +2 for physical assistance   Supine to sit: Max assist, +2 for physical assistance, HOB elevated Sit to supine: Max assist, +2 for physical assistance, +2 for safety/equipment   General bed mobility comments: assistance to scoot to [facility] with use of bed rails, bed flat    Transfers                        Ambulation/Gait                   Stairs             Wheelchair Mobility    Modified Rankin (Stroke Patients Only)       Balance Overall balance assessment: Needs assistance Sitting-balance support: Bilateral upper extremity supported, Feet supported Sitting balance-Leahy Scale: Poor   Postural control: Posterior lean                                  Cognition Arousal/Alertness: Awake/alert Behavior During Therapy: Flat affect Overall Cognitive Status: Within Functional Limits for tasks assessed                                          Exercises General Exercises - Lower Extremity Long Arc Quad: AROM, Strengthening, Right, 10 reps, Seated Other Exercises Other Exercises: PT provided gentle touch to residual limb for desensitization. Pt reports she has  not been performing this & not visually looking at her limb because she "doesn't want to". PT & OT provided education on importance of these.    General Comments        Pertinent Vitals/Pain Pain Assessment Pain Assessment: Faces Faces Pain Scale: Hurts little more Pain Location: L residual limb Pain Descriptors / Indicators: Discomfort, Grimacing Pain Intervention(s): Monitored during session    Home Living                          Prior Function            PT Goals (current goals can now be found in the care plan section) Acute Rehab PT Goals Patient Stated Goal: feel better PT Goal Formulation: With  patient Time For Goal Achievement: 02/16/22 Potential to Achieve Goals: Fair Progress towards PT goals: Progressing toward goals    Frequency    7X/week      PT Plan Current plan remains appropriate    Co-evaluation PT/OT/SLP Co-Evaluation/Treatment: Yes Reason for Co-Treatment: Complexity of the patient's impairments (multi-system involvement);For patient/therapist safety PT goals addressed during session: Mobility/safety with mobility;Balance;Strengthening/ROM        AM-PAC PT "6 Clicks" Mobility   Outcome Measure  Help needed turning from your back to your side while in a flat bed without using bedrails?: Total Help needed moving from lying on your back to sitting on the side of a flat bed without using bedrails?: Total Help needed moving to and from a bed to a chair (including a wheelchair)?: Total Help needed standing up from a chair using your arms (e.g., wheelchair or bedside chair)?: Total Help needed to walk in hospital room?: Total Help needed climbing 3-5 steps with a railing? : Total 6 Click Score: 6    End of Session   Activity Tolerance: Patient tolerated treatment well;Patient limited by fatigue Patient left: in bed;with call bell/phone within reach;with bed alarm set   PT Visit Diagnosis: Other abnormalities of gait and mobility (R26.89);Muscle weakness (generalized) (M62.81);Pain Pain - Right/Left: Left Pain - part of body: Leg     Time: 1138-1203 PT Time Calculation (min) (ACUTE ONLY): 25 min  Charges:  $Therapeutic Activity: 8-22 mins                     Lavone Nian, PT, DPT 02/03/22, 1:38 PM   Waunita Schooner 02/03/2022, 1:36 PM

## 2022-02-04 DIAGNOSIS — Z515 Encounter for palliative care: Secondary | ICD-10-CM | POA: Diagnosis not present

## 2022-02-04 DIAGNOSIS — R6521 Severe sepsis with septic shock: Secondary | ICD-10-CM | POA: Diagnosis not present

## 2022-02-04 DIAGNOSIS — A419 Sepsis, unspecified organism: Secondary | ICD-10-CM | POA: Diagnosis not present

## 2022-02-04 DIAGNOSIS — S91302A Unspecified open wound, left foot, initial encounter: Secondary | ICD-10-CM | POA: Diagnosis not present

## 2022-02-04 DIAGNOSIS — Z7189 Other specified counseling: Secondary | ICD-10-CM | POA: Diagnosis not present

## 2022-02-04 DIAGNOSIS — N179 Acute kidney failure, unspecified: Secondary | ICD-10-CM | POA: Diagnosis not present

## 2022-02-04 LAB — CBC WITH DIFFERENTIAL/PLATELET
Abs Immature Granulocytes: 0.06 10*3/uL (ref 0.00–0.07)
Basophils Absolute: 0.1 10*3/uL (ref 0.0–0.1)
Basophils Relative: 1 %
Eosinophils Absolute: 0.3 10*3/uL (ref 0.0–0.5)
Eosinophils Relative: 3 %
HCT: 23.4 % — ABNORMAL LOW (ref 36.0–46.0)
Hemoglobin: 7.3 g/dL — ABNORMAL LOW (ref 12.0–15.0)
Immature Granulocytes: 1 %
Lymphocytes Relative: 11 %
Lymphs Abs: 1 10*3/uL (ref 0.7–4.0)
MCH: 27.2 pg (ref 26.0–34.0)
MCHC: 31.2 g/dL (ref 30.0–36.0)
MCV: 87.3 fL (ref 80.0–100.0)
Monocytes Absolute: 0.9 10*3/uL (ref 0.1–1.0)
Monocytes Relative: 10 %
Neutro Abs: 6.7 10*3/uL (ref 1.7–7.7)
Neutrophils Relative %: 74 %
Platelets: 193 10*3/uL (ref 150–400)
RBC: 2.68 MIL/uL — ABNORMAL LOW (ref 3.87–5.11)
RDW: 15.9 % — ABNORMAL HIGH (ref 11.5–15.5)
WBC: 9 10*3/uL (ref 4.0–10.5)
nRBC: 0 % (ref 0.0–0.2)

## 2022-02-04 LAB — BASIC METABOLIC PANEL
Anion gap: 8 (ref 5–15)
BUN: 24 mg/dL — ABNORMAL HIGH (ref 8–23)
CO2: 26 mmol/L (ref 22–32)
Calcium: 9 mg/dL (ref 8.9–10.3)
Chloride: 103 mmol/L (ref 98–111)
Creatinine, Ser: 1.63 mg/dL — ABNORMAL HIGH (ref 0.44–1.00)
GFR, Estimated: 34 mL/min — ABNORMAL LOW (ref >60)
Glucose, Bld: 96 mg/dL (ref 70–99)
Potassium: 3.7 mmol/L (ref 3.5–5.1)
Sodium: 137 mmol/L (ref 135–145)

## 2022-02-04 NOTE — Progress Notes (Signed)
PROGRESS NOTE    [name]  GXQ:[id number] DOB: [birth year] DOA: 01/23/2022 PCP: Kirk Ruths, MD    Brief Narrative:  [name] is a 70 y.o. female  with medical history significant for multiple sclerosis,  neurogenic bladder, CKD stage IIIa, subdural hematoma s/p craniotomy in July 2023, peripheral vascular disease, morbid obesity, OSA, hyperlipidemia, COPD, follicular intra-abdominal lymphoma, major depressive disorder, recent hospitalization from 12/22/2021 through 12/26/2021 for management of nonhealing left ulcer and cellulitis of the left lower leg (bone biopsy was negative for osteomyelitis) and discharged on 2-week course of Augmentin.  She was hospitalized again from 9/13 through 01/12/2022 because of pain in the left foot, bloody drainage from the left heel ulcer.  She was seen by the podiatrist on visit.  She was treated with IV Unasyn for 48 hours and was discharged to SNF for local wound care.   She presented to the hospital again on 01/23/2022 because of hypotension and concern for possible sepsis.  She was admitted to the ICU for acute exacerbation of chronic diastolic CHF (BNP 1,448) and septic shock complicated by AKI on CKD stage IIIa with hyperkalemia.  She was treated with empiric IV antibiotics, IV hydrocortisone and vasopressors.  She has been weaned off Levophed.  She was transferred to the hospitalist service on 01/27/2022.  On 10/4 patient became concerned when the nurse tried to put a DNR bracelet on.  She stated she did not want to be a DNR and this was not properly explained to her previously.  Palliative care followed up on 10/4 and explained situation further.  At this time patient remains a full scope.  Also at this time patient is agreeable to amputation.  Vascular surgery consultation is pending.  10/6: Seen in consultation by vascular surgery.  Patient has agreed to amputation at this time.  Plan for OR 10/6  10/7: Status post BKA.  Tolerated procedure  well.  Postoperative pain moderate.  Hemoglobin 6.6 on postoperative day #1.  10/8: Continue complaints of pain..  Multimodal pain regimen in place.  No evidence of bleeding from stump.  Assessment & Plan:   Principal Problem:   Septic shock ([place]) Active Problems:   Cystitis due to Pseudomonas   Acute renal failure (HCC)   Acute on chronic diastolic CHF (congestive heart failure) (HCC)   Non healing left heel wound   Chronic kidney disease (CKD), stage III (moderate) (HCC)   Multiple sclerosis (HCC)   Obesity, Class III, BMI 40-49.9 (morbid obesity) (HCC)   Hypokalemia   COPD (chronic obstructive pulmonary disease) (HCC)   Hyperkalemia   Left heel decubitus ulcer, left anterior ankle wound, cellulitis left foot Patient now status post BKA tolerated procedure well Postoperative pain mild to moderate Plan: Postoperative wound care Vascular surgery follow-up Pain control   Worsening anemia of chronic disease, normocytic anemia Postoperative blood loss anemia  Received 1 unit PRBC on 10/3.  Hemoglobin responded appropriately.   Hemoglobin 6.6 on postoperative day #1 Received 1 unit PRBC.  Hemoglobin responded appropriately Plan: Hemoglobin stable No need for transfusion at this time   Body mass index is 50.63 kg/m.  (Morbid obesity)      Septic shock secondary to Pseudomonas UTI and suspected infected left foot ulcer with cellulitis:  Appreciate ID involvement.   Antibiotics de-escalated to Zosyn.   Continue this for now.   ID follow-up Monday 10/9.   May be able to discontinue antibiotics altogether as source control is now been achieved  AKI on CKD stage IIIa  She required hemodialysis support for AKI.   Creatinine has improved and hemodialysis has been discontinued.   Follow-up with nephrologist as outpatient   Acute on chronic diastolic CHF, paroxysmal atrial fibrillation:  Compensated.  Follow-up with cardiologist as outpatient   Hypokalemia: Monitor  and replace as necessary   Hypomagnesemia, monitor and replace as necessary      Multiple sclerosis with neurogenic bladder:  Continue indwelling Foley catheter.  Will DC with Foley catheter in place   Hyperkalemia and hyponatremia: Resolved     Other comorbidities include COPD, OSA     DVT prophylaxis: SQ heparin Code Status: DNR Family Communication: None today.  Unable to reach husband via phone Disposition Plan: Status is: Inpatient Remains inpatient appropriate because: Lower extremity infection on IV antibiotics.  Postoperative day #3 status post BKA   Level of care: Med-Surg  Consultants:  Vascular surgery Podiatry Palliative care Cardiology  Procedures:  None   Antimicrobials Zosyn   Subjective: Seen and examined.  Flattened affect.  More alert and communicative this morning  Objective: Vitals:   02/04/22 0700 02/04/22 0800 02/04/22 1000 02/04/22 1131  BP: (!) 148/62   (!) 167/62  Pulse: 62 (!) 57 61 (!) 58  Resp: 18 (!) '22 17 18  '$ Temp: 98.1 F (36.7 C)     TempSrc: Oral     SpO2:  95% 100% 100%  Weight:      Height:        Intake/Output Summary (Last 24 hours) at 02/04/2022 1308 Last data filed at 02/04/2022 0530 Gross per 24 hour  Intake 150 ml  Output 900 ml  Net -750 ml   Filed Weights   02/01/22 0500 02/01/22 2314 02/02/22 0509  Weight: 131.8 kg 131.5 kg 131 kg    Examination:  General exam: No acute distress.  Appears fatigued Respiratory system: Lungs clear.  Normal work of breathing.  Room air Cardiovascular system: S1-S2, RRR, no murmurs, no pedal edema Gastrointestinal system: Soft, NT/ND, normal bowel sounds Central nervous system: Alert and oriented. No focal neurological deficits. Extremities: Status post left BKA Skin: As above Psychiatry: Judgement and insight appear impaired. Mood & affect flattened.     Data Reviewed: I have personally reviewed following labs and imaging studies  CBC: Recent Labs  Lab  01/30/22 0423 02/01/22 0758 02/02/22 0816 02/02/22 1438 02/02/22 2156 02/03/22 0532 02/03/22 0824 02/04/22 0814  WBC 9.5 9.7 10.6*  --   --   --  8.8 9.0  NEUTROABS 7.1 7.9*  --   --   --   --  6.6 6.7  HGB 8.3* 8.7* 6.6* 6.6* 7.6* 7.4* 7.4* 7.3*  HCT 26.6* 27.6* 20.7* 21.2* 23.8* 23.5* 23.0* 23.4*  MCV 84.4 85.2 85.5  --   --   --  85.8 87.3  PLT 172 180 161  --   --   --  163 373   Basic Metabolic Panel: Recent Labs  Lab 01/29/22 0611 01/30/22 0150 01/30/22 0423 02/01/22 0758 02/03/22 0824 02/04/22 0814  NA 137 137 136 136 134* 137  K 3.1* 3.7 3.7 3.7 3.6 3.7  CL 104 106 108 106 102 103  CO2 '26 25 24 26 24 26  '$ GLUCOSE 92 106* 89 88 93 96  BUN '22 22 22 18 '$ 25* 24*  CREATININE 1.07* 1.11* 1.14* 1.12* 1.65* 1.63*  CALCIUM 8.8* 8.7* 8.8* 9.1 9.1 9.0  MG 1.6* 1.9 2.0 1.6*  --   --   PHOS 2.7  --   --   --   --   --  GFR: Estimated Creatinine Clearance: 42.6 mL/min (A) (by C-G formula based on SCr of 1.63 mg/dL (H)). Liver Function Tests: Recent Labs  Lab 01/29/22 0611  ALBUMIN 3.0*   No results for input(s): "LIPASE", "AMYLASE" in the last 168 hours. No results for input(s): "AMMONIA" in the last 168 hours. Coagulation Profile: No results for input(s): "INR", "PROTIME" in the last 168 hours. Cardiac Enzymes: No results for input(s): "CKTOTAL", "CKMB", "CKMBINDEX", "TROPONINI" in the last 168 hours. BNP (last 3 results) No results for input(s): "PROBNP" in the last 8760 hours. HbA1C: No results for input(s): "HGBA1C" in the last 72 hours. CBG: No results for input(s): "GLUCAP" in the last 168 hours.  Lipid Profile: No results for input(s): "CHOL", "HDL", "LDLCALC", "TRIG", "CHOLHDL", "LDLDIRECT" in the last 72 hours. Thyroid Function Tests: No results for input(s): "TSH", "T4TOTAL", "FREET4", "T3FREE", "THYROIDAB" in the last 72 hours. Anemia Panel: No results for input(s): "VITAMINB12", "FOLATE", "FERRITIN", "TIBC", "IRON", "RETICCTPCT" in the last 72  hours.  Sepsis Labs: Recent Labs  Lab 02/02/22 1438 02/02/22 1934  LATICACIDVEN 1.5 1.5     No results found for this or any previous visit (from the past 240 hour(s)).        Radiology Studies: No results found.      Scheduled Meds:  vitamin C  500 mg Oral BID   buPROPion  300 mg Oral Daily   Chlorhexidine Gluconate Cloth  6 each Topical Q0600   Chlorhexidine Gluconate Cloth  6 each Topical Once   clonazePAM  0.5 mg Oral BID   DULoxetine  60 mg Oral BH-q7a   feeding supplement (NEPRO CARB STEADY)  237 mL Oral TID BM   gabapentin  300 mg Oral TID   heparin  5,000 Units Subcutaneous Q8H   midodrine  5 mg Oral TID WC   multivitamin  1 tablet Oral QHS   traZODone  50 mg Oral QHS   vortioxetine HBr  5 mg Oral Daily   zinc sulfate  220 mg Oral Daily   Continuous Infusions:  sodium chloride Stopped (01/29/22 1637)   anticoagulant sodium citrate     piperacillin-tazobactam (ZOSYN)  IV 3.375 g (02/04/22 0513)     LOS: 12 days     Sidney Ace, MD [facility]   If 7PM-7AM, please contact night-coverage  02/04/2022, 1:08 PM

## 2022-02-04 NOTE — TOC Progression Note (Signed)
Transition of Care [facility]) - Progression Note    Patient Details  Name: JANISA LABUS MRN: [id number] Date of Birth: Jan 18, 1951  Transition of Care [facility]) CM/SW [place], [place] Phone Number: 02/04/2022, 3:47 PM  Clinical Narrative:     Patient and husband agreeable to snf now, CSW has sent out referrals. Pending bed offers at this time.    Expected Discharge Plan: [place] Barriers to Discharge: Continued Medical Work up  Expected Discharge Plan and Services Expected Discharge Plan: [place]   Discharge Planning Services: CM Consult Post Acute Care Choice: [place] arrangements for the past 2 months: [place] Determinants of Health (SDOH) Interventions    Readmission Risk Interventions    03/26/2021    4:21 PM 01/03/2020    3:37 PM  Readmission Risk Prevention Plan  Transportation Screening Complete Complete  PCP or Specialist Appt within 3-5 Days Complete   Social Work Consult for [place] Planning/Counseling Complete   Palliative Care Screening Complete   Medication Review Press photographer) Complete Complete  PCP or Specialist appointment within 3-5 days of discharge  Complete  HRI or [place]  Complete  SW Recovery Care/Counseling Consult  Complete  Palliative Care Screening  Not Applicable

## 2022-02-04 NOTE — Progress Notes (Signed)
Palliative: Mrs. Mahn is lying quietly in bed.  She greets me, making and mostly keeping eye contact.  She is alert and oriented, able to make her basic needs known.  There is no family present at bedside at this time.  Overall, she seems in much better spirits than when I last saw her.  We talked about her BKA.  She tells me that overall she is doing okay, but is having pain.  We talk about focusing on healing and rehab.  She tells me that she has spoken with her husband and they are planning for short-term rehab.  Conference with attending, bedside nursing staff, transition of care team related to patient condition, needs, goals of care, disposition.  Plan:   Continue full scope/full code.  Short-term rehab with ultimate goal to return home.   78 minutes Quinn Axe, NP Palliative medicine team Team phone [phone] Greater than 50% of this time was spent counseling and coordinating care related to the above assessment and plan.

## 2022-02-04 NOTE — Progress Notes (Signed)
Date of Admission:  01/23/2022   [name] is a 71 y.o. female with a history of history of MS, neurogenic bladder, chronic foley , treated follicular lymphoma, HTN, lumbar fusion ,immobility   Presents with left heel ulcer of many months and ankle wound which is worsening. Recent hospitalization for the same    Aug 2023  left heel pressure wound  and had undergone debridement and bone biopsy which was neg for osteo and culture rare korcuria and was sent to rehab  on Augmentin   9/13-9/16 back again for the same problem, given IV antibiotics 01/17/22 to the ED because of foul odor, sent on keflex and bactrim   01/23/22 back again with left foot wounds and was hypotensive       ID: [name] is a 71 y.o. female  Principal Problem:   Septic shock ([place]) Active Problems:   Multiple sclerosis ([place])   Obesity, Class III, BMI 40-49.9 (morbid obesity) ([place])   Cystitis due to Pseudomonas   Chronic kidney disease (CKD), stage III (moderate) (HCC)   Hypokalemia   COPD (chronic obstructive pulmonary disease) (HCC)   Non healing left heel wound   Acute renal failure (HCC)   Acute on chronic diastolic CHF (congestive heart failure) (HCC)   Hyperkalemia    Subjective: Says she is okay  Medications:   vitamin C  500 mg Oral BID   buPROPion  300 mg Oral Daily   Chlorhexidine Gluconate Cloth  6 each Topical Q0600   Chlorhexidine Gluconate Cloth  6 each Topical Once   clonazePAM  0.5 mg Oral BID   DULoxetine  60 mg Oral BH-q7a   feeding supplement (NEPRO CARB STEADY)  237 mL Oral TID BM   gabapentin  300 mg Oral TID   heparin  5,000 Units Subcutaneous Q8H   midodrine  5 mg Oral TID WC   multivitamin  1 tablet Oral QHS   traZODone  50 mg Oral QHS   vortioxetine HBr  5 mg Oral Daily   zinc sulfate  220 mg Oral Daily    Objective: Vital signs in last 24 hours: BP (!) 149/72 (BP Location: Left Arm)   Pulse (!) 53   Temp 98.1 F (36.7 C) (Oral)   Resp (!) 21   Ht '5\' 4"'$   (1.626 m)   Wt 131 kg   SpO2 97%   BMI 49.57 kg/m    LDA Foley Left IJ  PHYSICAL EXAM:  General: Alert, cooperative, pale, obese  Rt side of nostril ulcerating nodule -BCC Lungs: b/l air entry Heart: s1s2 Abdomen: Soft, non-tender,not distended. Bowel sounds normal. No masses Extremities: left leg BKA- surgical dressing As per vascular note there is edema of the stump, incision intact and clean , serous weeping. Stump soft and viable Left IJ line Skin: No rashes or lesions. Or bruising Lymph: Cervical, supraclavicular normal. Neurologic: did not assess in detail  Lab Results Recent Labs    02/03/22 0824 02/04/22 0814  WBC 8.8 9.0  HGB 7.4* 7.3*  HCT 23.0* 23.4*  NA 134* 137  K 3.6 3.7  CL 102 103  CO2 24 26  BUN 25* 24*  CREATININE 1.65* 1.63*     Assessment/Plan: Sepsis - due to wounds- resolved ? Multiple wounds left foot covered with eschar- s/p left BKA On zosyn, will dc today    AKI on CKD-  needed a few days of HD- resolved  Hyperkalemiai on admission- resolved- now hypokalemia   Anemia  MS   Discussed the management with the patient and hospitalist

## 2022-02-04 NOTE — Progress Notes (Signed)
Physical Therapy Treatment Patient Details Name: [name] MRN: [id number] DOB: 05-18-1950 Today's Date: 02/04/2022   History of Present Illness [name] is a 71 y.o. female with PMH including MS, COPD, follicular lymphoma, morbid obesity, PVD with lower extremity ulceration, depression / anxiety, legally blind, admitted on 01/23/2022 with  septic shock secondary to left foot infection and renal failure necessitating renal replacement therapy. Pt underwent L BKA on 02/01/22.    PT Comments    Pt is making limited progress towards goals secondary to feeling nauseated and painful. Timed session with RN staff for pain med coverage, however still reports severe pain throughout session. Pt very frustrated about social situation claiming her family doesn't care enough to call her/visit her. Supportive care given. Pt able to tolerate some there-ex and attempted to sit at EOB, however only able to last approx 5 seconds prior to requesting to return back to supine. Will plan for +2 assist next session.  Recommendations for follow up therapy are one component of a multi-disciplinary discharge planning process, led by the attending physician.  Recommendations may be updated based on patient status, additional functional criteria and insurance authorization.  Follow Up Recommendations  Skilled nursing-short term rehab (<3 hours/day) Can patient physically be transported by private vehicle: No   Assistance Recommended at Discharge Frequent or constant Supervision/Assistance  Patient can return home with the following Two people to help with walking and/or transfers;Two people to help with bathing/dressing/bathroom   Equipment Recommendations  None recommended by PT    Recommendations for Other Services       Precautions / Restrictions Precautions Precautions: Fall Restrictions Weight Bearing Restrictions: Yes LLE Weight Bearing: Non weight bearing     Mobility  Bed Mobility Overal bed  mobility: Needs Assistance Bed Mobility: Supine to Sit     Supine to sit: Max assist Sit to supine: Total assist   General bed mobility comments: reluctant to attempt, however once able to sit at EOB, pt becomes nauseated and request to return back to bed. Further attempts deferred. Anticipate need for +2 for next session    Transfers                   General transfer comment: Deferred for pt safety.    Ambulation/Gait                   Stairs             Wheelchair Mobility    Modified Rankin (Stroke Patients Only)       Balance Overall balance assessment: Needs assistance Sitting-balance support: Bilateral upper extremity supported, Feet supported Sitting balance-Leahy Scale: Poor                                      Cognition Arousal/Alertness: Awake/alert Behavior During Therapy: Flat affect Overall Cognitive Status: Within Functional Limits for tasks assessed                                          Exercises Other Exercises Other Exercises: desensitization techniques performed including rubbing with various textures and tapping. Other Exercises: also performed ther-ex on B LE including QS, SLRs, and hip abd/add. Mod assist on L LE and min assist on R LE. 8-10 reps performed    General Comments  Pertinent Vitals/Pain Pain Assessment Pain Assessment: 0-10 Pain Score: 8  Pain Location: L residual limb Pain Descriptors / Indicators: Discomfort, Grimacing Pain Intervention(s): Limited activity within patient's tolerance, Premedicated before session    Home Living                          Prior Function            PT Goals (current goals can now be found in the care plan section) Acute Rehab PT Goals Patient Stated Goal: feel better PT Goal Formulation: With patient Time For Goal Achievement: 02/16/22 Potential to Achieve Goals: Fair Progress towards PT goals: Progressing  toward goals    Frequency    7X/week      PT Plan Current plan remains appropriate    Co-evaluation              AM-PAC PT "6 Clicks" Mobility   Outcome Measure  Help needed turning from your back to your side while in a flat bed without using bedrails?: Total Help needed moving from lying on your back to sitting on the side of a flat bed without using bedrails?: Total Help needed moving to and from a bed to a chair (including a wheelchair)?: Total Help needed standing up from a chair using your arms (e.g., wheelchair or bedside chair)?: Total Help needed to walk in hospital room?: Total Help needed climbing 3-5 steps with a railing? : Total 6 Click Score: 6    End of Session   Activity Tolerance: Patient limited by pain Patient left: in bed;with bed alarm set Nurse Communication: Mobility status PT Visit Diagnosis: Other abnormalities of gait and mobility (R26.89);Muscle weakness (generalized) (M62.81);Pain Pain - Right/Left: Left Pain - part of body: Leg     Time: 9675-9163 PT Time Calculation (min) (ACUTE ONLY): 23 min  Charges:  $Therapeutic Exercise: 23-37 mins                     Greggory Stallion, PT, DPT, GCS [phone]    Donicia Druck 02/04/2022, 1:28 PM

## 2022-02-04 NOTE — NC FL2 (Signed)
[place] LEVEL OF CARE SCREENING TOOL     IDENTIFICATION  Patient Name: [name] Birthdate: 09-03-1950 Sex: female Admission Date (Current Location): 01/23/2022  [facility] and [place] Number:  Engineering geologist and Address:         Provider Number: [id number]  Attending Physician Name and Address:  Sidney Ace, MD  Relative Name and Phone Number:       Current Level of Care: Hospital Recommended Level of Care: [place] Prior Approval Number:    Date Approved/Denied:   PASRR Number: [id number]  Discharge Plan: SNF    Current Diagnoses: Patient Active Problem List   Diagnosis Date Noted   Acute renal failure ([place]) 01/27/2022   Acute on chronic diastolic CHF (congestive heart failure) ([place]) 01/27/2022   Hyperkalemia 01/27/2022   Septic shock ([place]) 01/23/2022   Foot ulcer ([place]) 01/10/2022   Ulcer of left heel ([place])    Non healing left heel wound 12/22/2021   Femur fracture, left ([place]) 02/04/2020   Left leg weakness 01/26/2020   COPD (chronic obstructive pulmonary disease) ([place]) 01/01/2020   Foley catheter in place on admission 11/19/2019   Neuropathy    Hypokalemia    Hyperlipidemia    Chronic kidney disease (CKD), stage III (moderate) ([place]) 04/20/2019   Ovarian mass, left 01/27/2019   Depression 05/13/2018   Recurrent cellulitis of lower extremity 06/25/2017   Medication monitoring encounter 06/05/2017   Cellulitis of left lower leg 04/25/2017   Adjustment disorder with mixed disturbance of emotions and conduct 04/23/2017   Cystitis due to Pseudomonas 01/02/2017   Left ovarian cyst 12/09/2016   Abdominal aortic atherosclerosis ([place]) 11/11/2016   Obstructive sleep apnea 02/12/5101   Follicular lymphoma of intra-abdominal lymph nodes ([place]) 08/06/2016   Obesity, Class III, BMI 40-49.9 (morbid obesity) ([place]) 01/05/2016   Skin ulcer ([place]) 11/08/2015   Peripheral vascular disease of lower extremity with ulceration ([place])  11/08/2015   CKD (chronic kidney disease) stage 3, GFR 30-59 ml/min (HCC) 11/08/2015   Constipation due to pain medication 01/31/2015   Obstructive sleep apnea of adult 01/13/2015   Atonic neurogenic bladder 10/24/2014   COPD with bronchial hyperresponsiveness ([place]) 10/02/2013   Major depressive disorder, recurrent, in partial remission ([place]) 10/02/2013   Essential hypertension 10/02/2013   Multiple sclerosis ([place]) 10/02/2013   Absence of bladder continence 09/25/2012   Kyphoscoliosis and scoliosis 11/26/2011    Orientation RESPIRATION BLADDER Height & Weight     Self, Time, Situation, Place  Normal Incontinent, External catheter Weight: 288 lb 12.8 oz (131 kg) Height:  '5\' 4"'$  (162.6 cm)  BEHAVIORAL SYMPTOMS/MOOD NEUROLOGICAL BOWEL NUTRITION STATUS      Incontinent Diet (see discharge summary)  AMBULATORY STATUS COMMUNICATION OF NEEDS Skin   Extensive Assist Verbally Other (Comment), Skin abrasions (abrasion nose, left leg closed incision)                       Personal Care Assistance Level of Assistance  Bathing, Feeding, Dressing, Total care Bathing Assistance: Limited assistance Feeding assistance: Independent Dressing Assistance: Limited assistance Total Care Assistance: Maximum assistance   Functional Limitations Info  Sight, Speech, Hearing Sight Info: Impaired Hearing Info: Impaired Speech Info: Adequate    SPECIAL CARE FACTORS FREQUENCY  PT (By licensed PT), OT (By licensed OT)     PT Frequency: min 4x weekly OT Frequency: min 4x weekly            Contractures Contractures Info: Not present  Additional Factors Info  Code Status, Allergies Code Status Info: full Allergies Info: NKA     Isolation Precautions Info: VRE, MRSA     Current Medications (02/04/2022):  This is the current hospital active medication list Current Facility-Administered Medications  Medication Dose Route Frequency Provider Last Rate Last Admin   0.9 %  sodium chloride  infusion   Intravenous PRN Algernon Huxley, MD   Stopped at 01/29/22 1637   acetaminophen (TYLENOL) tablet 650 mg  650 mg Oral Q6H PRN Algernon Huxley, MD   650 mg at 01/29/22 2031   alteplase (CATHFLO ACTIVASE) injection 2 mg  2 mg Intracatheter Once PRN Algernon Huxley, MD       anticoagulant sodium citrate solution 5 mL  5 mL Intracatheter PRN Lucky Cowboy, Erskine Squibb, MD       ascorbic acid (VITAMIN C) tablet 500 mg  500 mg Oral BID Algernon Huxley, MD   500 mg at 02/04/22 0959   benzonatate (TESSALON) capsule 100 mg  100 mg Oral TID PRN Algernon Huxley, MD   100 mg at 01/25/22 1807   buPROPion (WELLBUTRIN XL) 24 hr tablet 300 mg  300 mg Oral Daily Algernon Huxley, MD   300 mg at 02/04/22 0958   butalbital-acetaminophen-caffeine (FIORICET) 50-325-40 MG per tablet 1 tablet  1 tablet Oral Q6H PRN Algernon Huxley, MD   1 tablet at 01/28/22 2124   Chlorhexidine Gluconate Cloth 2 % PADS 6 each  6 each Topical Q0600 Algernon Huxley, MD   6 each at 02/04/22 0517   Chlorhexidine Gluconate Cloth 2 % PADS 6 each  6 each Topical Once Algernon Huxley, MD       clonazePAM [place]bye [place]) tablet 0.5 mg  0.5 mg Oral BID Algernon Huxley, MD   0.5 mg at 02/04/22 0959   DULoxetine (CYMBALTA) DR capsule 60 mg  60 mg Oral Sebastian Ache, MD   60 mg at 02/04/22 0539   feeding supplement (NEPRO CARB STEADY) liquid 237 mL  237 mL Oral TID BM Algernon Huxley, MD   237 mL at 02/03/22 2143   gabapentin (NEURONTIN) capsule 300 mg  300 mg Oral TID Ralene Muskrat B, MD   300 mg at 02/04/22 1457   heparin injection 1,000 Units  1,000 Units Intracatheter PRN Algernon Huxley, MD       heparin injection 5,000 Units  5,000 Units Subcutaneous Q8H Algernon Huxley, MD   5,000 Units at 02/04/22 1456   HYDROmorphone (DILAUDID) injection 1 mg  1 mg Intravenous Once PRN Kris Hartmann, NP       ipratropium-albuterol (DUONEB) 0.5-2.5 (3) MG/3ML nebulizer solution 3 mL  3 mL Nebulization Q4H PRN Sreenath, Sudheer B, MD       lidocaine (PF) (XYLOCAINE) 1 % injection 5 mL  5 mL  Intradermal PRN Dew, Erskine Squibb, MD       lidocaine-prilocaine (EMLA) cream 1 Application  1 Application Topical PRN Algernon Huxley, MD       midodrine (PROAMATINE) tablet 5 mg  5 mg Oral TID WC Sreenath, Sudheer B, MD   5 mg at 02/04/22 1455   morphine (PF) 2 MG/ML injection 2 mg  2 mg Intravenous Q3H PRN Algernon Huxley, MD   2 mg at 02/03/22 0617   multivitamin (RENA-VIT) tablet 1 tablet  1 tablet Oral QHS Algernon Huxley, MD   1 tablet at 02/03/22 2147   ondansetron [facility] injection 4  mg  4 mg Intravenous Q8H PRN Algernon Huxley, MD   4 mg at 01/29/22 1653   ondansetron (ZOFRAN) injection 4 mg  4 mg Intravenous Q6H PRN Kris Hartmann, NP       Oral care mouth rinse  15 mL Mouth Rinse PRN Lucky Cowboy, Erskine Squibb, MD       oxyCODONE (Oxy IR/ROXICODONE) immediate release tablet 5-10 mg  5-10 mg Oral Q4H PRN Algernon Huxley, MD   10 mg at 02/04/22 0959   pentafluoroprop-tetrafluoroeth (GEBAUERS) aerosol 1 Application  1 Application Topical PRN Algernon Huxley, MD       piperacillin-tazobactam (ZOSYN) IVPB 3.375 g  3.375 g Intravenous Q8H Algernon Huxley, MD 12.5 mL/hr at 02/04/22 1459 3.375 g at 02/04/22 1459   polyethylene glycol (MIRALAX / GLYCOLAX) packet 17 g  17 g Oral Daily PRN Algernon Huxley, MD       traZODone (DESYREL) tablet 50 mg  50 mg Oral QHS Algernon Huxley, MD   50 mg at 02/03/22 2142   vortioxetine HBr (TRINTELLIX) tablet 5 mg  5 mg Oral Daily Algernon Huxley, MD   5 mg at 02/04/22 1173   zinc sulfate capsule 220 mg  220 mg Oral Daily Algernon Huxley, MD   220 mg at 02/04/22 5670   Facility-Administered Medications Ordered in Other Encounters  Medication Dose Route Frequency Provider Last Rate Last Admin   heparin lock flush 100 unit/mL  500 Units Intracatheter Once PRN Sindy [place], MD         Discharge Medications: Please see discharge summary for a list of discharge medications.  Relevant Imaging Results:  Relevant Lab Results:   Additional Information SS# [id number]  Alberteen Sam, LCSW

## 2022-02-05 DIAGNOSIS — R6521 Severe sepsis with septic shock: Secondary | ICD-10-CM | POA: Diagnosis not present

## 2022-02-05 DIAGNOSIS — A419 Sepsis, unspecified organism: Secondary | ICD-10-CM | POA: Diagnosis not present

## 2022-02-05 MED ORDER — GABAPENTIN 300 MG PO CAPS
600.0000 mg | ORAL_CAPSULE | Freq: Three times a day (TID) | ORAL | Status: DC
  Administered 2022-02-05 – 2022-02-06 (×3): 600 mg via ORAL
  Filled 2022-02-05 (×3): qty 2

## 2022-02-05 MED ORDER — GABAPENTIN 300 MG PO CAPS
300.0000 mg | ORAL_CAPSULE | ORAL | Status: AC
  Administered 2022-02-05: 300 mg via ORAL
  Filled 2022-02-05: qty 1

## 2022-02-05 NOTE — Progress Notes (Signed)
IV consult to D/C central line: Per Primary RN via secure chat, " patient is planning to discharge today to skilled nursing facility but is pending insurance authorization.  I am not sure if they are going to be able to get the authorizatin or not by the end of the day.  they are working on it.  MD does not want central line removed until patient has discharge orders officially in since that is the only access she has at this time." Will follow up.

## 2022-02-05 NOTE — TOC Progression Note (Signed)
Transition of Care [facility]) - Progression Note    Patient Details  Name: [name] MRN: [id number] Date of Birth: 13-Oct-1950  Transition of Care [facility]) CM/SW [place], [place] Phone Number: 02/05/2022, 10:09 AM  Clinical Narrative:     CSW spoke with patient and spouse regarding [facility] bed offer, both in agreement. CSW has initiated insurance authorization, pending auth at this time.  Expected Discharge Plan: [place] Barriers to Discharge: Continued Medical Work up  Expected Discharge Plan and Services Expected Discharge Plan: [place]   Discharge Planning Services: CM Consult Post Acute Care Choice: [place] arrangements for the past 2 months: [place] Determinants of Health (SDOH) Interventions    Readmission Risk Interventions    03/26/2021    4:21 PM 01/03/2020    3:37 PM  Readmission Risk Prevention Plan  Transportation Screening Complete Complete  PCP or Specialist Appt within 3-5 Days Complete   Social Work Consult for [place] Planning/Counseling Complete   Palliative Care Screening Complete   Medication Review Press photographer) Complete Complete  PCP or Specialist appointment within 3-5 days of discharge  Complete  HRI or [place]  Complete  SW Recovery Care/Counseling Consult  Complete  Palliative Care Screening  Not Applicable

## 2022-02-05 NOTE — Care Management Important Message (Signed)
Important Message  Patient Details  Name: [name] MRN: [id number] Date of Birth: Dec 15, 1950   Medicare Important Message Given:  Yes  Reviewed Medicare IM with patient via room phone due to isolation status.  Copy given to patient's husband outside room for reference.    Dannette Barbara 02/05/2022, 2:03 PM

## 2022-02-05 NOTE — Progress Notes (Signed)
Occupational Therapy Treatment Patient Details Name: [name] MRN: [id number] DOB: 09/03/1950 Today's Date: 02/05/2022   History of present illness [name] is a 71 y.o. female with PMH including MS, COPD, follicular lymphoma, morbid obesity, PVD with lower extremity ulceration, depression / anxiety, legally blind, admitted on 01/23/2022 with  septic shock secondary to left foot infection and renal failure necessitating renal replacement therapy. Pt underwent L BKA on 02/01/22.   OT comments  Pt agreeable to OT/PT co-treatment to maximize safety and participation. Pt completed supine <> sit with Max A +2, log rolling with Mod A, and grooming while sitting EOB with set up A. Pt required Min A for dynamic sitting balance during grooming tasks with VC to maintain upright posture. Pt was motivated to participate in therapy, however, fatigued quickly and requesting to return to supine. Pt left as received with all needs in reach. Pt is making progress toward goal completion. D/C recommendation remains appropriate. OT will continue to follow acutely.    Recommendations for follow up therapy are one component of a multi-disciplinary discharge planning process, led by the attending physician.  Recommendations may be updated based on patient status, additional functional criteria and insurance authorization.    Follow Up Recommendations  Skilled nursing-short term rehab (<3 hours/day)    Assistance Recommended at Discharge Intermittent Supervision/Assistance  Patient can return home with the following  Assistance with cooking/housework;Help with stairs or ramp for entrance;Assist for transportation;Two people to help with bathing/dressing/bathroom;Two people to help with walking and/or transfers   [place] [facility] bed    Recommendations for Other Services      Precautions / Restrictions Precautions Precautions: Fall Restrictions Weight Bearing Restrictions: Yes LLE  Weight Bearing: Non weight bearing       Mobility Bed Mobility Overal bed mobility: Needs Assistance Bed Mobility: Rolling, Supine to Sit, Sit to Supine Rolling: Mod assist (able to complete log rolling L/R to adjust pad underneath)   Supine to sit: Max assist, +2 for physical assistance Sit to supine: Max assist, +2 for physical assistance        Transfers                   General transfer comment: unable to perform this date.     Balance Overall balance assessment: Needs assistance Sitting-balance support: Bilateral upper extremity supported, Feet supported Sitting balance-Leahy Scale: Poor Sitting balance - Comments: intermittent post lean with pt able to self correct with Min A. Still requires close Min guard for upright posture during dynamic sitting task Postural control: Posterior lean                                 ADL either performed or assessed with clinical judgement   ADL Overall ADL's : Needs assistance/impaired     Grooming: Wash/dry face;Sitting;Set up;Brushing hair               Lower Body Dressing: Maximal assistance;Bed level                 General ADL Comments: Max A +2 for all bed mobility, grooming tasks completed sitting EOB with Min A for balance, VC for optimal posture/positioning    Extremity/Trunk Assessment Upper Extremity Assessment Upper Extremity Assessment: Generalized weakness   Lower Extremity Assessment Lower Extremity Assessment: Generalized weakness;LLE deficits/detail LLE Deficits / Details: L BKA   Cervical / Trunk Assessment Cervical / Trunk Assessment:  Normal    Vision       Perception     Praxis      Cognition Arousal/Alertness: Awake/alert Behavior During Therapy: Flat affect Overall Cognitive Status: Within Functional Limits for tasks assessed                                          Exercises      Shoulder Instructions       General Comments       Pertinent Vitals/ Pain       Pain Assessment Pain Assessment: Faces Faces Pain Scale: Hurts whole lot Pain Location: L residual limb Pain Descriptors / Indicators: Discomfort, Grimacing Pain Intervention(s): Limited activity within patient's tolerance, Premedicated before session, Repositioned  Home Living                                          Prior Functioning/Environment              Frequency  Min 2X/week        Progress Toward Goals  OT Goals(current goals can now be found in the care plan section)  Progress towards OT goals: Progressing toward goals  Acute Rehab OT Goals Patient Stated Goal: to feel stronger OT Goal Formulation: With patient Time For Goal Achievement: 02/16/22 Potential to Achieve Goals: [place] Discharge plan remains appropriate;Frequency remains appropriate    Co-evaluation    PT/OT/SLP Co-Evaluation/Treatment: Yes Reason for Co-Treatment: Complexity of the patient's impairments (multi-system involvement);To address functional/ADL transfers PT goals addressed during session: Mobility/safety with mobility;Strengthening/ROM OT goals addressed during session: ADL's and self-care      AM-PAC OT "6 Clicks" Daily Activity     Outcome Measure   Help from another person eating meals?: None Help from another person taking care of personal grooming?: A Little Help from another person toileting, which includes using toliet, bedpan, or urinal?: A Lot Help from another person bathing (including washing, rinsing, drying)?: A Lot Help from another person to put on and taking off regular upper body clothing?: A Lot Help from another person to put on and taking off regular lower body clothing?: A Lot 6 Click Score: 15    End of Session    OT Visit Diagnosis: Unsteadiness on feet (R26.81);Muscle weakness (generalized) (M62.81);Pain Pain - Right/Left: Left Pain - part of body: Leg;Ankle and joints of foot   Activity  Tolerance Patient limited by fatigue;Patient limited by pain   Patient Left in bed;with call bell/phone within reach;with bed alarm set   Nurse Communication Mobility status        Time: 1157-2620 OT Time Calculation (min): 21 min  Charges: OT General Charges $OT Visit: 1 Visit OT Treatments $Self Care/Home Management : 8-22 mins  [facility] MS, OTR/L ascom [phone]  02/05/22, 2:03 PM

## 2022-02-05 NOTE — Progress Notes (Signed)
PROGRESS NOTE    [name]  CHY:[id number] DOB: Aug 28, 1950 DOA: 01/23/2022 PCP: Kirk Ruths, MD    Brief Narrative:  [name] is a 71 y.o. female  with medical history significant for multiple sclerosis,  neurogenic bladder, CKD stage IIIa, subdural hematoma s/p craniotomy in July 2023, peripheral vascular disease, morbid obesity, OSA, hyperlipidemia, COPD, follicular intra-abdominal lymphoma, major depressive disorder, recent hospitalization from 12/22/2021 through 12/26/2021 for management of nonhealing left ulcer and cellulitis of the left lower leg (bone biopsy was negative for osteomyelitis) and discharged on 2-week course of Augmentin.  She was hospitalized again from 9/13 through 01/12/2022 because of pain in the left foot, bloody drainage from the left heel ulcer.  She was seen by the podiatrist on visit.  She was treated with IV Unasyn for 48 hours and was discharged to SNF for local wound care.   She presented to the hospital again on 01/23/2022 because of hypotension and concern for possible sepsis.  She was admitted to the ICU for acute exacerbation of chronic diastolic CHF (BNP 8,786) and septic shock complicated by AKI on CKD stage IIIa with hyperkalemia.  She was treated with empiric IV antibiotics, IV hydrocortisone and vasopressors.  She has been weaned off Levophed.  She was transferred to the hospitalist service on 01/27/2022.  On 10/4 patient became concerned when the nurse tried to put a DNR bracelet on.  She stated she did not want to be a DNR and this was not properly explained to her previously.  Palliative care followed up on 10/4 and explained situation further.  At this time patient remains a full scope.  Also at this time patient is agreeable to amputation.  Vascular surgery consultation is pending.  10/6: Seen in consultation by vascular surgery.  Patient has agreed to amputation at this time.  Plan for OR 10/6  10/7: Status post BKA.  Tolerated procedure  well.  Postoperative pain moderate.  Hemoglobin 6.6 on postoperative day #1.  10/8: Continue complaints of pain..  Multimodal pain regimen in place.  No evidence of bleeding from stump.  10/10: Medically ready for discharge at this time.  Antibiotics discontinued.  Source control achieved.  Patient and husband have excepted skilled nursing facility bed at [facility].  Insurance authorization initiated.  Assessment & Plan:   Principal Problem:   Septic shock ([place]) Active Problems:   Cystitis due to Pseudomonas   Acute renal failure (HCC)   Acute on chronic diastolic CHF (congestive heart failure) (HCC)   Non healing left heel wound   Chronic kidney disease (CKD), stage III (moderate) (HCC)   Multiple sclerosis (HCC)   Obesity, Class III, BMI 40-49.9 (morbid obesity) (HCC)   Hypokalemia   COPD (chronic obstructive pulmonary disease) (HCC)   Hyperkalemia   Left heel decubitus ulcer, left anterior ankle wound, cellulitis left foot Patient now status post BKA tolerated procedure well Postoperative pain mild to moderate Plan: Postoperative wound care Vascular surgery outpatient follow-up Pain control   Worsening anemia of chronic disease, normocytic anemia Postoperative blood loss anemia  Received 1 unit PRBC on 10/3.  Hemoglobin responded appropriately.   Hemoglobin 6.6 on postoperative day #1 Received 1 unit PRBC.  Hemoglobin responded appropriately Plan: Hemoglobin stable No need for transfusion at this time   Body mass index is 50.63 kg/m.  (Morbid obesity)      Septic shock secondary to Pseudomonas UTI and suspected infected left foot ulcer with cellulitis:  Appreciate ID involvement.   Antibiotics  discontinued 10/9 Source control achieved   AKI on CKD stage IIIa  She required hemodialysis support for AKI.   Creatinine has improved and hemodialysis has been discontinued.   Follow-up with nephrologist as outpatient Can remove temporary dialysis catheter on  discharge   Acute on chronic diastolic CHF, paroxysmal atrial fibrillation:  Compensated.  Follow-up with cardiologist as outpatient   Multiple sclerosis with neurogenic bladder:  Continue indwelling Foley catheter.  Will DC with Foley catheter in place     Other comorbidities include COPD, OSA     DVT prophylaxis: SQ heparin Code Status: DNR Family Communication: None today.  Unable to reach husband via phone Disposition Plan: Status is: Inpatient Remains inpatient appropriate because: Unsafe discharge plan.  Medically ready for discharge at this time.  Level of care: Med-Surg  Consultants:  Vascular surgery Podiatry Palliative care Cardiology  Procedures:  None   Antimicrobials   Subjective: Seen and examined.  Flattened affect.  More alert and communicative this morning  Objective: Vitals:   02/05/22 0459 02/05/22 0834 02/05/22 0836 02/05/22 1116  BP: (!) 136/46 (!) 149/59  (!) 156/57  Pulse: (!) 52 (!) 55  62  Resp: '14 18 18 16  '$ Temp: 98 F (36.7 C) 98.6 F (37 C)  98.5 F (36.9 C)  TempSrc: Oral   Oral  SpO2: 99% 95%  100%  Weight:      Height:        Intake/Output Summary (Last 24 hours) at 02/05/2022 1502 Last data filed at 02/05/2022 0941 Gross per 24 hour  Intake 240 ml  Output 1550 ml  Net -1310 ml   Filed Weights   02/01/22 0500 02/01/22 2314 02/02/22 0509  Weight: 131.8 kg 131.5 kg 131 kg    Examination:  General exam: No acute distress Respiratory system: Lungs clear.  Normal work of breathing.  Room air Cardiovascular system: S1-S2, RRR, no murmurs, no pedal edema Gastrointestinal system: Soft, NT/ND, normal bowel sounds Central nervous system: Alert and oriented. No focal neurological deficits. Extremities: Status post left BKA.  Surgical site clean dry and intact Skin: As above Psychiatry: Judgement and insight appear impaired. Mood & affect flattened.     Data Reviewed: I have personally reviewed following labs and imaging  studies  CBC: Recent Labs  Lab 01/30/22 0423 02/01/22 0758 02/02/22 0816 02/02/22 1438 02/02/22 2156 02/03/22 0532 02/03/22 0824 02/04/22 0814  WBC 9.5 9.7 10.6*  --   --   --  8.8 9.0  NEUTROABS 7.1 7.9*  --   --   --   --  6.6 6.7  HGB 8.3* 8.7* 6.6* 6.6* 7.6* 7.4* 7.4* 7.3*  HCT 26.6* 27.6* 20.7* 21.2* 23.8* 23.5* 23.0* 23.4*  MCV 84.4 85.2 85.5  --   --   --  85.8 87.3  PLT 172 180 161  --   --   --  163 244   Basic Metabolic Panel: Recent Labs  Lab 01/30/22 0150 01/30/22 0423 02/01/22 0758 02/03/22 0824 02/04/22 0814  NA 137 136 136 134* 137  K 3.7 3.7 3.7 3.6 3.7  CL 106 108 106 102 103  CO2 '25 24 26 24 26  '$ GLUCOSE 106* 89 88 93 96  BUN '22 22 18 '$ 25* 24*  CREATININE 1.11* 1.14* 1.12* 1.65* 1.63*  CALCIUM 8.7* 8.8* 9.1 9.1 9.0  MG 1.9 2.0 1.6*  --   --    GFR: Estimated Creatinine Clearance: 42.6 mL/min (A) (by C-G formula based on SCr of 1.63 mg/dL (H)).  Liver Function Tests: No results for input(s): "AST", "ALT", "ALKPHOS", "BILITOT", "PROT", "ALBUMIN" in the last 168 hours.  No results for input(s): "LIPASE", "AMYLASE" in the last 168 hours. No results for input(s): "AMMONIA" in the last 168 hours. Coagulation Profile: No results for input(s): "INR", "PROTIME" in the last 168 hours. Cardiac Enzymes: No results for input(s): "CKTOTAL", "CKMB", "CKMBINDEX", "TROPONINI" in the last 168 hours. BNP (last 3 results) No results for input(s): "PROBNP" in the last 8760 hours. HbA1C: No results for input(s): "HGBA1C" in the last 72 hours. CBG: No results for input(s): "GLUCAP" in the last 168 hours.  Lipid Profile: No results for input(s): "CHOL", "HDL", "LDLCALC", "TRIG", "CHOLHDL", "LDLDIRECT" in the last 72 hours. Thyroid Function Tests: No results for input(s): "TSH", "T4TOTAL", "FREET4", "T3FREE", "THYROIDAB" in the last 72 hours. Anemia Panel: No results for input(s): "VITAMINB12", "FOLATE", "FERRITIN", "TIBC", "IRON", "RETICCTPCT" in the last 72  hours.  Sepsis Labs: Recent Labs  Lab 02/02/22 1438 02/02/22 1934  LATICACIDVEN 1.5 1.5     No results found for this or any previous visit (from the past 240 hour(s)).        Radiology Studies: No results found.      Scheduled Meds:  vitamin C  500 mg Oral BID   buPROPion  300 mg Oral Daily   Chlorhexidine Gluconate Cloth  6 each Topical Q0600   Chlorhexidine Gluconate Cloth  6 each Topical Once   clonazePAM  0.5 mg Oral BID   DULoxetine  60 mg Oral BH-q7a   feeding supplement (NEPRO CARB STEADY)  237 mL Oral TID BM   gabapentin  300 mg Oral TID   heparin  5,000 Units Subcutaneous Q8H   multivitamin  1 tablet Oral QHS   traZODone  50 mg Oral QHS   vortioxetine HBr  5 mg Oral Daily   zinc sulfate  220 mg Oral Daily   Continuous Infusions:  sodium chloride Stopped (01/29/22 1637)   anticoagulant sodium citrate       LOS: 13 days     Sidney Ace, MD [facility]   If 7PM-7AM, please contact night-coverage  02/05/2022, 3:02 PM

## 2022-02-05 NOTE — Progress Notes (Signed)
Physical Therapy Treatment Patient Details Name: WYNEMA GAROUTTE MRN: [id number] DOB: Feb 04, 1951 Today's Date: 02/05/2022   History of Present Illness SENNA LAPE is a 71 y.o. female with PMH including MS, COPD, follicular lymphoma, morbid obesity, PVD with lower extremity ulceration, depression / anxiety, legally blind, admitted on 01/23/2022 with  septic shock secondary to left foot infection and renal failure necessitating renal replacement therapy. Pt underwent L BKA on 02/01/22.    PT Comments    CO-treat performed with OT this date. Able to perform supine there-ex and seated there-ex once at EOB. +2 max assist performed for bed mobility with pt able to sit at EOB for prolonged time to perform ADLs with OT. Poor balance noted with quick fatigue and post lean. Will continue to progress as able.  Recommendations for follow up therapy are one component of a multi-disciplinary discharge planning process, led by the attending physician.  Recommendations may be updated based on patient status, additional functional criteria and insurance authorization.  Follow Up Recommendations  Skilled nursing-short term rehab (<3 hours/day) Can patient physically be transported by private vehicle: No   Assistance Recommended at Discharge Frequent or constant Supervision/Assistance  Patient can return home with the following Two people to help with walking and/or transfers;Two people to help with bathing/dressing/bathroom   Equipment Recommendations  None recommended by PT    Recommendations for Other Services       Precautions / Restrictions Precautions Precautions: Fall Restrictions Weight Bearing Restrictions: No LLE Weight Bearing: Non weight bearing     Mobility  Bed Mobility Overal bed mobility: Needs Assistance Bed Mobility: Rolling, Supine to Sit, Sit to Supine Rolling: Mod assist   Supine to sit: Max assist, +2 for physical assistance Sit to supine: Max assist, +2 for physical  assistance   General bed mobility comments: able to roll to B sides for chux pad placement, however still requires max assist +2 for bed mobility and repositioning    Transfers                   General transfer comment: unable to perform this date.    Ambulation/Gait                   Stairs             Wheelchair Mobility    Modified Rankin (Stroke Patients Only)       Balance Overall balance assessment: Needs assistance Sitting-balance support: Bilateral upper extremity supported, Feet supported Sitting balance-Leahy Scale: Poor Sitting balance - Comments: intermittent post lean with pt able to self correct with min assist. Still requires close cga for upright posture during dynamic task                                    Cognition Arousal/Alertness: Awake/alert Behavior During Therapy: Flat affect Overall Cognitive Status: Within Functional Limits for tasks assessed                                 General Comments: brighter affect this date with willingness to particiapte        Exercises Other Exercises Other Exercises: desensitization techniques performed including rubbing with various textures and tapping. Other Exercises: supine ther-ex performed on B LE including SLR, QS and hip abd/add. Once seated, R LE LAQ. Requires min/mod assist for all ther-ex  with oinly able to perform ~8-10 reps    General Comments        Pertinent Vitals/Pain Pain Assessment Pain Assessment: Faces Faces Pain Scale: Hurts whole lot Pain Location: L residual limb Pain Descriptors / Indicators: Discomfort, Grimacing Pain Intervention(s): Limited activity within patient's tolerance, Premedicated before session, Repositioned    Home Living                          Prior Function            PT Goals (current goals can now be found in the care plan section) Acute Rehab PT Goals Patient Stated Goal: feel better PT  Goal Formulation: With patient Time For Goal Achievement: 02/16/22 Potential to Achieve Goals: Fair Progress towards PT goals: Progressing toward goals    Frequency    7X/week      PT Plan Current plan remains appropriate    Co-evaluation PT/OT/SLP Co-Evaluation/Treatment: Yes Reason for Co-Treatment: To address functional/ADL transfers;Complexity of the patient's impairments (multi-system involvement) PT goals addressed during session: Mobility/safety with mobility;Strengthening/ROM OT goals addressed during session: ADL's and self-care      AM-PAC PT "6 Clicks" Mobility   Outcome Measure  Help needed turning from your back to your side while in a flat bed without using bedrails?: A Lot Help needed moving from lying on your back to sitting on the side of a flat bed without using bedrails?: Total Help needed moving to and from a bed to a chair (including a wheelchair)?: Total Help needed standing up from a chair using your arms (e.g., wheelchair or bedside chair)?: Total Help needed to walk in hospital room?: Total Help needed climbing 3-5 steps with a railing? : Total 6 Click Score: 7    End of Session   Activity Tolerance: Patient limited by pain Patient left: in bed;with bed alarm set Nurse Communication: Mobility status PT Visit Diagnosis: Other abnormalities of gait and mobility (R26.89);Muscle weakness (generalized) (M62.81);Pain Pain - Right/Left: Left Pain - part of body: Leg     Time: 1030-1057 PT Time Calculation (min) (ACUTE ONLY): 27 min  Charges:  $Therapeutic Exercise: 8-22 mins                     Greggory Stallion, PT, DPT, GCS [phone]    Feven Alderfer 02/05/2022, 1:44 PM

## 2022-02-05 NOTE — TOC Progression Note (Signed)
Transition of Care [facility]) - Progression Note    Patient Details  Name: ALIESHA DOLATA MRN: [id number] Date of Birth: May 28, 1950  Transition of Care [facility]) CM/SW [place], [place] Phone Number: 02/05/2022, 4:33 PM  Clinical Narrative:     Insurance authorization for [facility] continues to pend at this time.   Expected Discharge Plan: [place] Barriers to Discharge: Continued Medical Work up  Expected Discharge Plan and Services Expected Discharge Plan: [place]   Discharge Planning Services: CM Consult Post Acute Care Choice: [place] arrangements for the past 2 months: [place] Determinants of Health (SDOH) Interventions    Readmission Risk Interventions    03/26/2021    4:21 PM 01/03/2020    3:37 PM  Readmission Risk Prevention Plan  Transportation Screening Complete Complete  PCP or Specialist Appt within 3-5 Days Complete   Social Work Consult for [place] Planning/Counseling Complete   Palliative Care Screening Complete   Medication Review Press photographer) Complete Complete  PCP or Specialist appointment within 3-5 days of discharge  Complete  HRI or [place]  Complete  SW Recovery Care/Counseling Consult  Complete  Palliative Care Screening  Not Applicable

## 2022-02-05 NOTE — Plan of Care (Signed)

## 2022-02-06 DIAGNOSIS — L03116 Cellulitis of left lower limb: Secondary | ICD-10-CM | POA: Diagnosis not present

## 2022-02-06 DIAGNOSIS — R279 Unspecified lack of coordination: Secondary | ICD-10-CM | POA: Diagnosis not present

## 2022-02-06 DIAGNOSIS — L97529 Non-pressure chronic ulcer of other part of left foot with unspecified severity: Secondary | ICD-10-CM | POA: Diagnosis not present

## 2022-02-06 DIAGNOSIS — Z89511 Acquired absence of right leg below knee: Secondary | ICD-10-CM | POA: Diagnosis not present

## 2022-02-06 DIAGNOSIS — Z743 Need for continuous supervision: Secondary | ICD-10-CM | POA: Diagnosis not present

## 2022-02-06 DIAGNOSIS — N3080 Other cystitis without hematuria: Secondary | ICD-10-CM | POA: Diagnosis not present

## 2022-02-06 DIAGNOSIS — J449 Chronic obstructive pulmonary disease, unspecified: Secondary | ICD-10-CM | POA: Diagnosis not present

## 2022-02-06 DIAGNOSIS — I959 Hypotension, unspecified: Secondary | ICD-10-CM | POA: Diagnosis not present

## 2022-02-06 DIAGNOSIS — N179 Acute kidney failure, unspecified: Secondary | ICD-10-CM | POA: Diagnosis not present

## 2022-02-06 DIAGNOSIS — N1831 Chronic kidney disease, stage 3a: Secondary | ICD-10-CM | POA: Diagnosis not present

## 2022-02-06 DIAGNOSIS — D631 Anemia in chronic kidney disease: Secondary | ICD-10-CM | POA: Diagnosis not present

## 2022-02-06 DIAGNOSIS — L89624 Pressure ulcer of left heel, stage 4: Secondary | ICD-10-CM | POA: Diagnosis not present

## 2022-02-06 DIAGNOSIS — R112 Nausea with vomiting, unspecified: Secondary | ICD-10-CM | POA: Diagnosis not present

## 2022-02-06 DIAGNOSIS — Z79899 Other long term (current) drug therapy: Secondary | ICD-10-CM | POA: Diagnosis not present

## 2022-02-06 DIAGNOSIS — S91302A Unspecified open wound, left foot, initial encounter: Secondary | ICD-10-CM | POA: Diagnosis not present

## 2022-02-06 DIAGNOSIS — R6521 Severe sepsis with septic shock: Secondary | ICD-10-CM | POA: Diagnosis not present

## 2022-02-06 DIAGNOSIS — G35 Multiple sclerosis: Secondary | ICD-10-CM | POA: Diagnosis not present

## 2022-02-06 DIAGNOSIS — L0390 Cellulitis, unspecified: Secondary | ICD-10-CM | POA: Diagnosis not present

## 2022-02-06 DIAGNOSIS — I5033 Acute on chronic diastolic (congestive) heart failure: Secondary | ICD-10-CM | POA: Diagnosis not present

## 2022-02-06 DIAGNOSIS — I10 Essential (primary) hypertension: Secondary | ICD-10-CM | POA: Diagnosis not present

## 2022-02-06 DIAGNOSIS — A419 Sepsis, unspecified organism: Secondary | ICD-10-CM | POA: Diagnosis not present

## 2022-02-06 DIAGNOSIS — Z515 Encounter for palliative care: Secondary | ICD-10-CM | POA: Diagnosis not present

## 2022-02-06 DIAGNOSIS — E876 Hypokalemia: Secondary | ICD-10-CM | POA: Diagnosis not present

## 2022-02-06 DIAGNOSIS — Z7189 Other specified counseling: Secondary | ICD-10-CM | POA: Diagnosis not present

## 2022-02-06 LAB — BASIC METABOLIC PANEL
Anion gap: 6 (ref 5–15)
BUN: 21 mg/dL (ref 8–23)
CO2: 27 mmol/L (ref 22–32)
Calcium: 8.7 mg/dL — ABNORMAL LOW (ref 8.9–10.3)
Chloride: 105 mmol/L (ref 98–111)
Creatinine, Ser: 1.18 mg/dL — ABNORMAL HIGH (ref 0.44–1.00)
GFR, Estimated: 49 mL/min — ABNORMAL LOW (ref >60)
Glucose, Bld: 87 mg/dL (ref 70–99)
Potassium: 3.7 mmol/L (ref 3.5–5.1)
Sodium: 138 mmol/L (ref 135–145)

## 2022-02-06 LAB — CBC WITH DIFFERENTIAL/PLATELET
Abs Immature Granulocytes: 0.03 10*3/uL (ref 0.00–0.07)
Basophils Absolute: 0.1 10*3/uL (ref 0.0–0.1)
Basophils Relative: 1 %
Eosinophils Absolute: 0.3 10*3/uL (ref 0.0–0.5)
Eosinophils Relative: 4 %
HCT: 23.5 % — ABNORMAL LOW (ref 36.0–46.0)
Hemoglobin: 7.5 g/dL — ABNORMAL LOW (ref 12.0–15.0)
Immature Granulocytes: 1 %
Lymphocytes Relative: 14 %
Lymphs Abs: 0.9 10*3/uL (ref 0.7–4.0)
MCH: 28 pg (ref 26.0–34.0)
MCHC: 31.9 g/dL (ref 30.0–36.0)
MCV: 87.7 fL (ref 80.0–100.0)
Monocytes Absolute: 0.5 10*3/uL (ref 0.1–1.0)
Monocytes Relative: 8 %
Neutro Abs: 4.5 10*3/uL (ref 1.7–7.7)
Neutrophils Relative %: 72 %
Platelets: 185 10*3/uL (ref 150–400)
RBC: 2.68 MIL/uL — ABNORMAL LOW (ref 3.87–5.11)
RDW: 16.3 % — ABNORMAL HIGH (ref 11.5–15.5)
WBC: 6.3 10*3/uL (ref 4.0–10.5)
nRBC: 0 % (ref 0.0–0.2)

## 2022-02-06 LAB — MAGNESIUM: Magnesium: 1.6 mg/dL — ABNORMAL LOW (ref 1.7–2.4)

## 2022-02-06 LAB — SURGICAL PATHOLOGY

## 2022-02-06 MED ORDER — MAGNESIUM OXIDE 400 MG PO TABS: 400.0000 mg | ORAL_TABLET | Freq: Every day | ORAL | 0 refills | Status: AC

## 2022-02-06 MED ORDER — OXYCODONE HCL 5 MG PO TABS: 5.0000 mg | ORAL_TABLET | Freq: Three times a day (TID) | ORAL | 0 refills | Status: DC | PRN

## 2022-02-06 MED ORDER — CLONAZEPAM 0.5 MG PO TABS: 0.5000 mg | ORAL_TABLET | Freq: Two times a day (BID) | ORAL | 0 refills | Status: DC

## 2022-02-06 NOTE — Discharge Summary (Signed)
Physician Discharge Summary   Patient: [name] MRN: [id number] DOB: 1950-08-21  Admit date:     01/23/2022  Discharge date: 02/06/22  Discharge Physician: Jennye Boroughs   PCP: Kirk Ruths, MD   Recommendations at discharge:   Follow-up with Dr. Lucky Cowboy, vascular surgeon, in 1 week Follow-up with Dr. Clayborn Bigness, cardiologist, in 1 week Monitor basic metabolic panel, magnesium and hemoglobin level at the nursing home, preferably within 1 week of discharge  Discharge Diagnoses: Principal Problem:   Septic shock ([place]) Active Problems:   Cystitis due to Pseudomonas   Acute renal failure ([name])   Acute on chronic diastolic CHF (congestive heart failure) (HCC)   Non healing left heel wound   Chronic kidney disease (CKD), stage III (moderate) (HCC)   Multiple sclerosis (HCC)   Obesity, Class III, BMI 40-49.9 (morbid obesity) ([place])   Hypokalemia   COPD (chronic obstructive pulmonary disease) ([place])   Hyperkalemia  Resolved Problems:   * No resolved hospital problems. *  Hospital Course:  [name] is a 71 y.o. female  with medical history significant for multiple sclerosis,  neurogenic bladder, CKD stage IIIa, subdural hematoma s/p craniotomy in July 2023, peripheral vascular disease, morbid obesity, OSA, hyperlipidemia, COPD, follicular intra-abdominal lymphoma, major depressive disorder, recent hospitalization from 12/22/2021 through 12/26/2021 for management of nonhealing left ulcer and cellulitis of the left lower leg (bone biopsy was negative for osteomyelitis) and discharged on 2-week course of Augmentin.  She was hospitalized again from 9/13 through 01/12/2022 because of pain in the left foot, bloody drainage from the left heel ulcer.  She was seen by the podiatrist on visit.  She was treated with IV Unasyn for 48 hours and was discharged to SNF for local wound care.   She presented to the hospital again on 01/23/2022 because of hypotension and concern for possible sepsis.   She was admitted to the ICU for acute exacerbation of chronic diastolic CHF (BNP 2,119) and septic shock complicated by AKI on CKD stage IIIa with hyperkalemia.  She was treated with empiric IV antibiotics, IV hydrocortisone and vasopressors.  She has been weaned off Levophed.  She was transferred to the hospitalist service on 01/27/2022.    Assessment and Plan:   Septic shock secondary to Pseudomonas UTI and suspected infected left foot ulcer with cellulitis: Completed antibiotics. (She initially required Levophed and midodrine for septic shock)   Left heel decubitus ulcer, left anterior ankle wound, cellulitis left foot: MRI of the left foot is concerning for chronic osteomyelitis involving the posterior aspect of the calcaneus and findings concerning for diabetic myopathy/myositis and left foot cellulitis.  Podiatrist recommended left foot amputation.  Left below the knee amputation was performed on 02/01/2022 by Dr. Lucky Cowboy, vascular surgeon.  Outpatient follow-up with Dr. Lucky Cowboy.  AKI on CKD stage IIIa: She required hemodialysis support for AKI.  Creatinine is back to baseline and hemodialysis has been discontinued.   Acute on chronic diastolic CHF, paroxysmal atrial fibrillation: Compensated.  Follow-up with cardiologist.   Hypomagnesemia: Replete with oral magnesium oxide.   Anemia of chronic disease, normocytic anemia:  Hemoglobin is down to 7.3.  Hemoglobin was 10.4 on 01/17/2022. S/p transfusion with 1 unit of packed red blood cells on 01/29/2022 and 1 unit of packed red blood cells on 12/23/2021.  Monitor H&H in the outpatient setting. Vitamin B12 and iron levels were normal.     Multiple sclerosis with neurogenic bladder: Continue indwelling Foley catheter.   Hyperkalemia, hypokalemia and hyponatremia: Resolved  Other comorbidities include COPD, OSA          Pain control - [place] Controlled Substance Reporting System database was reviewed. and patient was instructed, not  to drive, operate heavy machinery, perform activities at heights, swimming or participation in water activities or provide baby-sitting services while on Pain, Sleep and Anxiety Medications; until their outpatient Physician has advised to do so again. Also recommended to not to take more than prescribed Pain, Sleep and Anxiety Medications.  Consultants: Intensivist, cardiologist, nephrologist, podiatrist, palliative care, vascular surgeon, ID specialist Procedures performed: Left IJ central line on 01/23/2022, left below the knee amputation on 02/01/2022 Disposition: Skilled nursing facility Diet recommendation:  Cardiac diet DISCHARGE MEDICATION: Allergies as of 02/06/2022   No Known Allergies      Medication List     STOP taking these medications    cephALEXin 500 MG capsule Commonly known as: KEFLEX   hydrALAZINE 50 MG tablet Commonly known as: APRESOLINE   lisinopril 20 MG tablet Commonly known as: ZESTRIL   nystatin powder Generic drug: nystatin   sulfamethoxazole-trimethoprim 800-160 MG tablet Commonly known as: BACTRIM DS       TAKE these medications    acetaminophen 500 MG tablet Commonly known as: TYLENOL Take 1,000 mg by mouth every 8 (eight) hours as needed for moderate pain.   amLODipine 5 MG tablet Commonly known as: NORVASC Take 5 mg by mouth daily.   atorvastatin 10 MG tablet Commonly known as: LIPITOR Take 10 mg by mouth at bedtime.   budesonide-formoterol 160-4.5 MCG/ACT inhaler Commonly known as: SYMBICORT Inhale 2 puffs into the lungs 2 (two) times daily.   buPROPion 300 MG 24 hr tablet Commonly known as: WELLBUTRIN XL Take 300 mg by mouth daily.   clonazePAM 0.5 MG tablet Commonly known as: KLONOPIN Take 1 tablet (0.5 mg total) by mouth 2 (two) times daily.   docusate sodium 100 MG capsule Commonly known as: COLACE Take 1 capsule (100 mg total) by mouth 2 (two) times daily as needed.   DULoxetine 60 MG capsule Commonly known as:  CYMBALTA Take 1 capsule (60 mg total) by mouth every morning.   gabapentin 600 MG tablet Commonly known as: NEURONTIN Take 600 mg by mouth 3 (three) times daily.   magnesium oxide 400 MG tablet Commonly known as: MAG-OX Take 1 tablet (400 mg total) by mouth daily for 5 days.   multivitamin with minerals Tabs tablet Take 1 tablet by mouth daily.   Myrbetriq 50 MG Tb24 tablet Generic drug: mirabegron ER TAKE ONE TABLET BY MOUTH ONCE DAILY   oxyCODONE 5 MG immediate release tablet Commonly known as: Oxy IR/ROXICODONE Take 1 tablet (5 mg total) by mouth every 8 (eight) hours as needed for moderate pain or severe pain.   QUEtiapine Fumarate 150 MG 24 hr tablet Commonly known as: SEROQUEL XR Take 150 mg by mouth at bedtime.   torsemide 20 MG tablet Commonly known as: DEMADEX Take 1 tablet (20 mg total) by mouth daily as needed (edema).   traZODone 50 MG tablet Commonly known as: DESYREL Take 50 mg by mouth at bedtime.   vortioxetine HBr 5 MG Tabs tablet Commonly known as: TRINTELLIX Take 5 mg by mouth daily.               Discharge Care Instructions  (From admission, onward)           Start     Ordered   02/06/22 0000  Discharge wound care:  Comments: Follow up with Dr. Lucky Cowboy, vascular surgeon, in 1 week   02/06/22 1148            Contact information for follow-up providers     Callwood, Dwayne D, MD. Go in 1 week(s).   Specialties: Cardiology, Internal Medicine Contact information: [place] [address] [phone]         Algernon Huxley, MD. Schedule an appointment as soon as possible for a visit in 1 week(s).   Specialties: Vascular Surgery, Radiology, Interventional Cardiology Contact information: [place] [address] [phone]              Contact information for after-discharge care     Destination     [facility] Preferred SNF .   Service: Skilled  Nursing Contact information: [place] [phone]                    Discharge Exam: Danley Danker Weights   02/01/22 0500 02/01/22 2314 02/02/22 0509  Weight: 131.8 kg 131.5 kg 131 kg   GEN: NAD SKIN: Warm and dry EYES: No pallor or icterus ENT: MMM CV: RRR PULM: CTA B ABD: soft, obese, NT, +BS CNS: AAO x 3, non focal EXT: Right led edema. Left BKA stump with intact stap;es GU: Foley catheter in place draining amber urine  Condition at discharge: good  The results of significant diagnostics from this hospitalization (including imaging, microbiology, ancillary and laboratory) are listed below for reference.   Imaging Studies: MR ANKLE LEFT WO CONTRAST  Result Date: 01/27/2022 CLINICAL DATA:  Progressive left anterior ankle ulcer and heel ulcer. Preop imaging for possible amputation. Evaluate for osteomyelitis. EXAM: MRI OF THE LEFT ANKLE WITHOUT CONTRAST TECHNIQUE: Multiplanar, multisequence MR imaging of the ankle was performed. No intravenous contrast was administered. COMPARISON:  Left foot radiographs 01/23/2022 and 01/17/2022; FINDINGS: TENDONS Peroneal: The peroneus longus and brevis tendons are intact. Posteromedial: Intact tibialis posterior, flexor digitorum longus, and flexor hallucis longus tendons. Anterior: The tibialis anterior, extensor hallucis longus, and extensor digitorum longus tendons are intact. Achilles: Intact. Plantar Fascia: Mild-to-moderate plantar calcaneal heel spur. No plantar fasciitis. High-grade edema/fluid within the flexor digitorum brevis musculature within the visualized plantar midfoot. LIGAMENTS Lateral: The anterior and posterior talofibular, anterior and posterior tibiofibular, and calcaneofibular ligaments are intact. Medial: The tibiotalar deep deltoid and tibial spring ligaments are intact. CARTILAGE Ankle Joint: Mild thinning of the tibiotalar cartilage, greatest at the far medial aspect of the talar  dome. Subtalar Joints/Sinus Tarsi: Fat is preserved within sinus tarsi. Bones: Mild cartilage thinning of the talonavicular, navicular-cuneiform, and tarsometatarsal joints. There is again a soft tissue defect within the posterior heel just distal to the Achilles tendon insertion, contacting bone. There is again moderate marrow edema throughout the posterolateral and posterior aspect of the calcaneus, very similar to prior. This is again consistent with chronic osteomyelitis. Other: There is high-grade edema within the abductor hallucis and flexor digitorum brevis muscles with mild fluid around the peripheral fascial planes. There is complete fatty infiltration as of the abductor digiti minimi muscle as seen on prior 01/17/2022 axial T1 weighted sequence. There is again moderate swelling and fluid bright edema within the visualized dorsal midfoot to forefoot subcutaneous fat at the level of the metatarsals. IMPRESSION: 1. There is again a soft tissue defect within the posterior heel just distal to the Achilles tendon insertion, contacting bone. There is again moderate marrow edema throughout the posterolateral  and posterior aspect of the calcaneus, consistent with chronic osteomyelitis. 2. High-grade edema within the abductor hallucis and flexor digitorum brevis muscles with mild fluid around the peripheral fascial planes. This is compatible with diabetic myopathy/myositis. 3. Moderate swelling and fluid bright edema within the visualized dorsal midfoot to forefoot subcutaneous fat at the level of the metatarsals. This is suggestive of cellulitis. Electronically Signed   By: Yvonne Kendall M.D.   On: 01/27/2022 16:24   ECHOCARDIOGRAM COMPLETE  Result Date: 01/24/2022    ECHOCARDIOGRAM REPORT   Patient Name:   [name] [facility] Date of Exam: 01/23/2022 Medical Rec #:  [id number]     Height:       64.0 in Accession #:    [id number]    Weight:       297.0 lb Date of Birth:  1950-07-07      BSA:          2.314 m Patient  Age:    89 years      BP:           104/74 mmHg Patient Gender: F             HR:           85 bpm. Exam Location:  [facility] Procedure: 2D Echo, Cardiac Doppler and Color Doppler Indications:     L45.62 Acute Diastolic CHF  History:         Patient has prior history of Echocardiogram examinations, most                  recent 10/20/2019. COPD, Arrythmias:Atrial Fibrillation; Risk                  Factors:Hypotension.  Sonographer:     Cresenciano Lick RDCS Referring Phys:  [id number] Ottie Glazier Diagnosing Phys: Yolonda Kida MD IMPRESSIONS  1. Left ventricular ejection fraction, by estimation, is 50 to 55%. The left ventricle has low normal function. The left ventricle has no regional wall motion abnormalities. Left ventricular diastolic parameters were normal.  2. Right ventricular systolic function is normal. The right ventricular size is normal.  3. Left atrial size was mild to moderately dilated.  4. The mitral valve is normal in structure. No evidence of mitral valve regurgitation.  5. The aortic valve is normal in structure. Aortic valve regurgitation is not visualized. FINDINGS  Left Ventricle: Left ventricular ejection fraction, by estimation, is 50 to 55%. The left ventricle has low normal function. The left ventricle has no regional wall motion abnormalities. The left ventricular internal cavity size was normal in size. There is borderline left ventricular hypertrophy. Left ventricular diastolic parameters were normal. Right Ventricle: The right ventricular size is normal. No increase in right ventricular wall thickness. Right ventricular systolic function is normal. Left Atrium: Left atrial size was mild to moderately dilated. Right Atrium: Right atrial size was normal in size. Pericardium: There is no evidence of pericardial effusion. Mitral Valve: The mitral valve is normal in structure. No evidence of mitral valve regurgitation. Tricuspid Valve: The tricuspid valve is normal in structure.  Tricuspid valve regurgitation is trivial. Aortic Valve: The aortic valve is normal in structure. Aortic valve regurgitation is not visualized. Pulmonic Valve: The pulmonic valve was normal in structure. Pulmonic valve regurgitation is not visualized. Aorta: The ascending aorta was not well visualized. IAS/Shunts: No atrial level shunt detected by color flow Doppler.  LEFT VENTRICLE PLAX 2D LVIDd:         4.60 cm LVIDs:  3.40 cm LV PW:         1.20 cm LV IVS:        1.20 cm LVOT diam:     2.00 cm LVOT Area:     3.14 cm  RIGHT VENTRICLE RV Basal diam:  3.80 cm RV S prime:     15.00 cm/s LEFT ATRIUM             Index        RIGHT ATRIUM           Index LA diam:        5.00 cm 2.16 cm/m   RA Area:     10.80 cm LA Vol (A2C):   41.8 ml 18.06 ml/m  RA Volume:   25.40 ml  10.98 ml/m LA Vol (A4C):   27.9 ml 12.06 ml/m LA Biplane Vol: 37.0 ml 15.99 ml/m   AORTA Ao Root diam: 3.70 cm Ao Asc diam:  3.50 cm MV E velocity: 118.67 cm/s                             SHUNTS                             Systemic Diam: 2.00 cm Yolonda Kida MD Electronically signed by Yolonda Kida MD Signature Date/Time: 01/24/2022/1:21:41 PM    Final    DG Chest Port 1 View  Result Date: 01/23/2022 CLINICAL DATA:  Central line placement. EXAM: PORTABLE CHEST 1 VIEW COMPARISON:  Chest radiograph 01/23/2022 at 9:39 a.m. FINDINGS: Telemetry leads overlie the chest. A left jugular catheter has been placed and terminates over the upper SVC. The cardiomediastinal silhouette is unchanged and within normal limits for portable AP technique. The lungs remain hypoinflated. No focal airspace consolidation, overt pulmonary edema, sizable pleural effusion, or pneumothorax is identified. IMPRESSION: Interval left jugular catheter placement as above. Electronically Signed   By: Logan Bores M.D.   On: 01/23/2022 18:42   NM Pulmonary Perfusion  Result Date: 01/23/2022 CLINICAL DATA:  Shortness of breath. EXAM: NUCLEAR MEDICINE PERFUSION  LUNG SCAN TECHNIQUE: Perfusion images were obtained in multiple projections after intravenous injection of radiopharmaceutical. Ventilation scans intentionally deferred if perfusion scan and chest x-ray adequate for interpretation during COVID 19 epidemic. RADIOPHARMACEUTICALS:  4.5 mCi Tc-90mMAA IV COMPARISON:  Chest x-ray 01/23/2022 FINDINGS: No segmental or subsegmental perfusion defects to suggest pulmonary embolism. IMPRESSION: Negative perfusion study for pulmonary embolism. Electronically Signed   By: PMarijo SanesM.D.   On: 01/23/2022 16:25   DG Foot Complete Left  Result Date: 01/23/2022 CLINICAL DATA:  Evaluate for osteomyelitis. Possible sepsis. Evidence for chronic osteomyelitis in the calcaneus on previous MRI. EXAM: LEFT FOOT - COMPLETE 3+ VIEW COMPARISON:  Left foot 01/17/2022 and foot MRI 01/17/2022 FINDINGS: Again noted is diffuse soft tissue swelling in the left foot. Stable appearance of the calcaneus with calcaneal spurring. No new areas of bone destruction or periosteal reaction involving the calcaneus. Again noted is a linear density along the plantar soft tissues measuring roughly 8 mm and similar to the prior examination. Negative for an acute fracture or dislocation. Alignment of the left foot is stable. IMPRESSION: 1. No acute bone abnormality to the left foot. No radiographic evidence for osteomyelitis. Refer to recent MRI from 01/17/2022. 2. Stable appearance of the linear density along the plantar soft tissues. Findings are suggestive for a foreign  body or focal calcification. 3. Diffuse soft tissue swelling. 4. Calcaneal spurring. Electronically Signed   By: Markus Daft M.D.   On: 01/23/2022 10:10   DG Chest Port 1 View  Result Date: 01/23/2022 CLINICAL DATA:  Concern for sepsis EXAM: PORTABLE CHEST 1 VIEW COMPARISON:  Chest x-ray dated January 17, 2022 FINDINGS: The heart size and mediastinal contours are within normal limits. Low lung volumes with hypoventilatory changes.  No definite focal consolidation. The visualized skeletal structures are unremarkable. IMPRESSION: Low lung volumes with hypoventilatory changes. No definite focal consolidation. Electronically Signed   By: Yetta Glassman M.D.   On: 01/23/2022 09:56   MR FOOT LEFT WO CONTRAST  Result Date: 01/17/2022 CLINICAL DATA:  Foot swelling, osteomyelitis suspected. Patient with left anterior foot ulcer and heel ulcer with prior left heel debridement. EXAM: MRI OF THE LEFT FOOT WITHOUT CONTRAST TECHNIQUE: Multiplanar, multisequence MR imaging of the left was performed. No intravenous contrast was administered. COMPARISON:  Radiographs dated January 17, 2022 FINDINGS: Evaluation of multiple sequences is limited due to motion. Bones/Joint/Cartilage There is bone marrow edema of the posterior aspect of the calcaneus adjacent to the deep skin wound inferior to the insertion of the Achilles tendon concerning for ongoing osteomyelitis. Ligaments Limited evaluation due to motion. Muscles and Tendons Increased intrasubstance signal of the plantar muscles suggesting diabetic myopathy/myositis. No fluid collection or abscess. Soft tissues Subcutaneous soft tissue edema about the dorsum and plantar aspect of the foot. No fluid collection or abscess. Deep skin wound about the posterior aspect of the calcaneus inferior to the Achilles insertion. IMPRESSION: 1. Bone marrow edema of the posterior aspect of the calcaneus adjacent to the deep skin wound about the inferior aspect of the Achilles tendon consistent with ongoing chronic osteomyelitis. It is difficult to compare progress from prior examination due to motion and unavailability of comparable sequences. 2. Increased intramuscular signal of the plantar muscles suggesting diabetic myopathy/myositis. 3. Generalized subcutaneous soft tissue edema about the foot without evidence of fluid collection or abscess. Electronically Signed   By: Keane Police D.O.   On: 01/17/2022 22:13    DG Foot 2 Views Left  Result Date: 01/17/2022 CLINICAL DATA:  Left foot wound. EXAM: LEFT FOOT - 2 VIEW COMPARISON:  Radiograph 01/09/2022, heel MRI 12/23/2021 FINDINGS: The bones are diffusely under mineralized. The soft tissue defect posterior to the calcaneus on prior radiograph is less apparent on the current exam. Again seen linear density in the plantar soft tissues measuring 7 mm on the lateral view, cannot be further localized on the AP view. No erosion, bone destruction, or fracture. There is diffuse soft tissue edema. No soft tissue gas. IMPRESSION: 1. Soft tissue edema. 2. The previous suspected calcaneal osteomyelitis an MRI has no radiographic correlate. 3. Unchanged linear 7 mm density in the plantar soft tissues, may represent foreign body or soft tissue calcification. Electronically Signed   By: Keith Rake M.D.   On: 01/17/2022 18:09   DG Chest 1 View  Result Date: 01/17/2022 CLINICAL DATA:  Left foot wound, possible sepsis EXAM: CHEST  1 VIEW COMPARISON:  03/23/2021 FINDINGS: Single frontal view of the chest demonstrates a stable cardiac silhouette. Chronic left suprahilar scarring. No airspace disease, effusion, or pneumothorax. No acute bony abnormalities. IMPRESSION: 1. No acute intrathoracic process. Electronically Signed   By: Randa Ngo M.D.   On: 01/17/2022 18:06   DG Foot Complete Left  Result Date: 01/10/2022 CLINICAL DATA:  Pain, bruising in left foot EXAM: LEFT FOOT -  COMPLETE 3+ VIEW COMPARISON:  None Available. FINDINGS: Plantar calcaneal spur. No acute bony abnormality. Specifically, no fracture, subluxation, or dislocation. Small linear density seen in the plantar soft tissues of unknown etiology. This could reflect a small foreign body or soft tissue calcification. IMPRESSION: No acute bony abnormality. Small linear density in the plantar soft tissues which could reflect a small foreign body or soft tissue calcification. Electronically Signed   By: Rolm Baptise M.D.   On: 01/10/2022 00:14    Microbiology: Results for orders placed or performed during the hospital encounter of 01/23/22  Blood Culture (routine x 2)     Status: None   Collection Time: 01/23/22  9:37 AM   Specimen: BLOOD  Result Value Ref Range Status   Specimen Description BLOOD RIGHT ANTECUBITAL  Final   Special Requests   Final    BOTTLES DRAWN AEROBIC AND ANAEROBIC Blood Culture adequate volume   Culture   Final    NO GROWTH 6 DAYS Performed at [facility], [address], [place], [place] [address]    Report Status 01/29/2022 FINAL  Final  Blood Culture (routine x 2)     Status: None   Collection Time: 01/23/22  9:37 AM   Specimen: BLOOD  Result Value Ref Range Status   Specimen Description BLOOD LEFT ANTECUBITAL  Final   Special Requests   Final    BOTTLES DRAWN AEROBIC AND ANAEROBIC Blood Culture results may not be optimal due to an inadequate volume of blood received in culture bottles   Culture   Final    NO GROWTH 6 DAYS Performed at [facility], [address], [place], [place] [address]    Report Status 01/29/2022 FINAL  Final  Urine Culture     Status: Abnormal   Collection Time: 01/23/22  9:37 AM   Specimen: In/Out Cath Urine  Result Value Ref Range Status   Specimen Description   Final    IN/OUT CATH URINE Performed at [facility], [address], [place], [place] [address]    Special Requests   Final    NONE Performed at [facility], [address] [place], [place] [address]    Culture >=100,000 COLONIES/mL PSEUDOMONAS AERUGINOSA (A)  Final   Report Status 01/25/2022 FINAL  Final   Organism ID, Bacteria PSEUDOMONAS AERUGINOSA (A)  Final      Susceptibility   Pseudomonas aeruginosa - MIC*    CEFTAZIDIME 2 SENSITIVE Sensitive     CIPROFLOXACIN <=0.25 SENSITIVE Sensitive     GENTAMICIN <=1 SENSITIVE Sensitive     IMIPENEM 2 SENSITIVE Sensitive     PIP/TAZO <=4 SENSITIVE Sensitive     CEFEPIME 4  SENSITIVE Sensitive     * >=100,000 COLONIES/mL PSEUDOMONAS AERUGINOSA  MRSA Next Gen by PCR, Nasal     Status: None   Collection Time: 01/23/22  4:54 PM   Specimen: Nasal Mucosa; Nasal Swab  Result Value Ref Range Status   MRSA by PCR Next Gen NOT DETECTED NOT DETECTED Final    Comment: (NOTE) The GeneXpert MRSA Assay (FDA approved for NASAL specimens only), is one component of a comprehensive MRSA colonization surveillance program. It is not intended to diagnose MRSA infection nor to guide or monitor treatment for MRSA infections. Test performance is not FDA approved in patients less than 76 years old. Performed at [facility], [address] [place], [place] [address]   Culture, blood (Routine X 2) w Reflex to ID Panel  Status: None   Collection Time: 01/23/22  5:02 PM   Specimen: BLOOD RIGHT HAND  Result Value Ref Range Status   Specimen Description BLOOD RIGHT HAND  Final   Special Requests   Final    BOTTLES DRAWN AEROBIC AND ANAEROBIC Blood Culture adequate volume   Culture   Final    NO GROWTH 6 DAYS Performed at [facility], [address], [place], [place] [address]    Report Status 01/29/2022 FINAL  Final  Aerobic/Anaerobic Culture w Gram Stain (surgical/deep wound)     Status: None   Collection Time: 01/24/22  5:04 PM   Specimen: Wound  Result Value Ref Range Status   Specimen Description   Final    WOUND Performed at [facility], [place] [address], [place], [place] [address]    Special Requests   Final    LEFT FOOT Performed at [facility], [address] [place], [place] [address]    Gram Stain   Final    NO WBC SEEN FEW GRAM POSITIVE COCCI IN PAIRS FEW GRAM NEGATIVE RODS    Culture   Final    ABUNDANT MULTIPLE ORGANISMS PRESENT, NONE PREDOMINANT NO STAPHYLOCOCCUS AUREUS ISOLATED No Pseudomonas species isolated NO ANAEROBES ISOLATED Performed at [facility], [address], [place], [place] [address]     Report Status 01/29/2022 FINAL  Final    Labs: CBC: Recent Labs  Lab 02/01/22 0758 02/02/22 0816 02/02/22 1438 02/02/22 2156 02/03/22 0532 02/03/22 0824 02/04/22 0814 02/06/22 0516  WBC 9.7 10.6*  --   --   --  8.8 9.0 6.3  NEUTROABS 7.9*  --   --   --   --  6.6 6.7 4.5  HGB 8.7* 6.6*   < > 7.6* 7.4* 7.4* 7.3* 7.5*  HCT 27.6* 20.7*   < > 23.8* 23.5* 23.0* 23.4* 23.5*  MCV 85.2 85.5  --   --   --  85.8 87.3 87.7  PLT 180 161  --   --   --  163 193 185   < > = values in this interval not displayed.   Basic Metabolic Panel: Recent Labs  Lab 02/01/22 0758 02/03/22 0824 02/04/22 0814 02/06/22 0516  NA 136 134* 137 138  K 3.7 3.6 3.7 3.7  CL 106 102 103 105  CO2 '26 24 26 27  '$ GLUCOSE 88 93 96 87  BUN 18 25* 24* 21  CREATININE 1.12* 1.65* 1.63* 1.18*  CALCIUM 9.1 9.1 9.0 8.7*  MG 1.6*  --   --   --    Liver Function Tests: No results for input(s): "AST", "ALT", "ALKPHOS", "BILITOT", "PROT", "ALBUMIN" in the last 168 hours. CBG: No results for input(s): "GLUCAP" in the last 168 hours.  Discharge time spent: greater than 30 minutes.  Signed: Jennye Boroughs, MD [facility] 02/06/2022

## 2022-02-06 NOTE — TOC Transition Note (Signed)
Transition of Care [facility]) - CM/SW Discharge Note   Patient Details  Name: [name] MRN: [id number] Date of Birth: 1951-04-27  Transition of Care [facility]) CM/SW Contact:  Colen Darling, [place] Phone Number: 02/06/2022, 3:54 PM   Clinical Narrative:     Patient has orders to discharge to [facility] today. RN will call report to [phone] (room 702). EMS Transport has been arranged. There are several in front of her. No further concerns. CSW signing off.  Final next level of care: Skilled Nursing Facility Barriers to Discharge: Barriers Resolved   Patient Goals and CMS Choice Patient states their goals for this hospitalization and ongoing recovery are:: Patient wants to go home at discharge CMS Medicare.gov Compare Post Acute Care list provided to:: Patient Choice offered to / list presented to : Patient  Discharge Placement   Existing PASRR number confirmed : 02/04/22          Patient chooses bed at: [facility] Patient to be transferred to facility by: [facility] Name of family member notified: [place] Patient and family notified of of transfer: 02/06/22  Discharge Plan and Services   Discharge Planning Services: CM Consult Post Acute Care Choice: Home Health                               Social Determinants of Health (SDOH) Interventions     Readmission Risk Interventions    03/26/2021    4:21 PM 01/03/2020    3:37 PM  Readmission Risk Prevention Plan  Transportation Screening Complete Complete  PCP or Specialist Appt within 3-5 Days Complete   Social Work Consult for [place] Planning/Counseling Complete   Palliative Care Screening Complete   Medication Review Press photographer) Complete Complete  PCP or Specialist appointment within 3-5 days of discharge  Complete  HRI or [place]  Complete  SW Recovery Care/Counseling Consult  Complete  Palliative Care Screening  Not Applicable

## 2022-02-06 NOTE — Progress Notes (Addendum)
[employer] insurance auth approved:  [name] [id number] Plan auth ID: [id number]  Gena with [facility] informed, MD updated.   Per Gena with [facility] patient will go to Room 702.   [place], [place]

## 2022-02-06 NOTE — Progress Notes (Signed)
Palliative:  [name] is lying quietly in bed in a dark room.  She greets me, making and mostly keeping eye contact.  She appears chronically ill and frail, obese.  She is alert and oriented, able to make her basic needs known.  There is no family at bedside at this time.  We talked about her progression since her BKA.  We talked about going to short-term rehab.  I encouraged her to work closely with rehab, this is the best way to get home quickly.  We talked about her needs at the rehab and I encouraged her to reach out to her husband to bring clothes and toiletries to rehab.  Questions answered, no new issues at this time.  Anticipate discharge today.  Plan:  Continue full scope/full code.  Time for outcomes.  Short-term rehab with ultimate goal of returning home.  25 minutes  Quinn Axe, NP Palliative Medicine Team  Team Phone [phone] Greater than 50% of this time was spent counseling and coordinating care related to the above assessment and plan.

## 2022-02-06 NOTE — Plan of Care (Signed)

## 2022-02-07 DIAGNOSIS — G35 Multiple sclerosis: Secondary | ICD-10-CM | POA: Diagnosis not present

## 2022-02-07 DIAGNOSIS — D631 Anemia in chronic kidney disease: Secondary | ICD-10-CM | POA: Diagnosis not present

## 2022-02-07 DIAGNOSIS — L89624 Pressure ulcer of left heel, stage 4: Secondary | ICD-10-CM | POA: Diagnosis not present

## 2022-02-07 DIAGNOSIS — R6521 Severe sepsis with septic shock: Secondary | ICD-10-CM | POA: Diagnosis not present

## 2022-02-11 DIAGNOSIS — Z89511 Acquired absence of right leg below knee: Secondary | ICD-10-CM | POA: Diagnosis not present

## 2022-02-11 DIAGNOSIS — D631 Anemia in chronic kidney disease: Secondary | ICD-10-CM | POA: Diagnosis not present

## 2022-02-12 DIAGNOSIS — I959 Hypotension, unspecified: Secondary | ICD-10-CM | POA: Diagnosis not present

## 2022-02-12 DIAGNOSIS — L89624 Pressure ulcer of left heel, stage 4: Secondary | ICD-10-CM | POA: Diagnosis not present

## 2022-02-12 DIAGNOSIS — R112 Nausea with vomiting, unspecified: Secondary | ICD-10-CM | POA: Diagnosis not present

## 2022-02-12 DIAGNOSIS — I10 Essential (primary) hypertension: Secondary | ICD-10-CM | POA: Diagnosis not present

## 2022-02-15 DIAGNOSIS — Z89511 Acquired absence of right leg below knee: Secondary | ICD-10-CM | POA: Diagnosis not present

## 2022-02-18 ENCOUNTER — Ambulatory Visit: Payer: Medicare HMO | Admitting: Physician Assistant

## 2022-02-19 ENCOUNTER — Ambulatory Visit (INDEPENDENT_AMBULATORY_CARE_PROVIDER_SITE_OTHER): Payer: Medicare HMO | Admitting: Nurse Practitioner

## 2022-02-19 ENCOUNTER — Encounter (INDEPENDENT_AMBULATORY_CARE_PROVIDER_SITE_OTHER): Payer: Self-pay | Admitting: Nurse Practitioner

## 2022-02-19 VITALS — BP 122/72 | HR 60 | Resp 16

## 2022-02-19 DIAGNOSIS — I959 Hypotension, unspecified: Secondary | ICD-10-CM | POA: Diagnosis not present

## 2022-02-19 DIAGNOSIS — Z89511 Acquired absence of right leg below knee: Secondary | ICD-10-CM | POA: Diagnosis not present

## 2022-02-19 DIAGNOSIS — Z89512 Acquired absence of left leg below knee: Secondary | ICD-10-CM

## 2022-02-19 DIAGNOSIS — J449 Chronic obstructive pulmonary disease, unspecified: Secondary | ICD-10-CM | POA: Diagnosis not present

## 2022-02-22 DIAGNOSIS — Z89511 Acquired absence of right leg below knee: Secondary | ICD-10-CM | POA: Diagnosis not present

## 2022-02-24 ENCOUNTER — Encounter (INDEPENDENT_AMBULATORY_CARE_PROVIDER_SITE_OTHER): Payer: Self-pay | Admitting: Nurse Practitioner

## 2022-02-24 NOTE — Progress Notes (Signed)
Subjective:    Patient ID: [name], female    DOB: October 07, 1950, 71 y.o.   MRN: [id number] Chief Complaint  Patient presents with   Routine Post Op    [place] 1 week left bka    [name] is a 71 year old female who presented to [facility] following worsening infection of her left foot.  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  The patient has underwent multiple rounds of antibiotics with lack of healing.  The patient had noninvasive studies which showed adequate perfusion for wound healing however despite this there was a clear lack of improvement over several months.  The patient underwent a left below-knee amputation on 02/01/2022.    Review of Systems  Musculoskeletal:  Positive for gait problem.  Skin:  Positive for wound.  All other systems reviewed and are negative.      Objective:   Physical Exam Vitals reviewed.  HENT:     Head: Normocephalic.  Cardiovascular:     Rate and Rhythm: Normal rate.     Pulses: Normal pulses.  Pulmonary:     Effort: Pulmonary effort is normal.  Musculoskeletal:     Left Lower Extremity: Left leg is amputated below knee.  Skin:    General: Skin is warm and dry.  Neurological:     Mental Status: She is alert and oriented to person, place, and time.  Psychiatric:        Mood and Affect: Mood normal.        Behavior: Behavior normal.        Thought Content: Thought content normal.        Judgment: Judgment normal.     BP 122/72 (BP Location: Left Arm)   Pulse 60   Resp 16   Past Medical History:  Diagnosis Date   Abdominal aortic atherosclerosis ([place]) 11/11/2016   ADHD    Anxiety    COPD (chronic obstructive pulmonary disease) (HCC)    Depression    major depressive   Dyspnea    doe   Edema    left leg   Follicular lymphoma (HCC)    B Cell   Follicular lymphoma grade II (HCC)    Hypertension    Hypotension    idiopathic   Kyphoscoliosis and scoliosis 11/26/2011    Morbid obesity ([place]) 01/05/2016   Multiple sclerosis (HCC)    Multiple sclerosis (HCC)    1980's   Neuromuscular disorder ([place])    Obstructive and reflux uropathy    foley   Pain    atypical facial   Peripheral vascular disease of lower extremity with ulceration ([place]) 11/08/2015   Skin ulcer ([place]) 11/08/2015   Weakness    generalized. has MS    Social History   Socioeconomic History   Marital status: Married    Spouse name: Not on file   Number of children: 3   Years of education: Not on file   Highest education level: Not on file  Occupational History   Occupation: disabled  Tobacco Use   Smoking status: Former    Packs/day: 1.00    Years: 20.00    Total pack years: 20.00    Types: Cigarettes    Start date: 04/30/1995    Quit date: 02/03/2016    Years since quitting: 6.0   Smokeless tobacco: Never  Vaping Use   Vaping Use: Some days  Substance and Sexual Activity   Alcohol use: No  Alcohol/week: 0.0 standard drinks of alcohol   Drug use: Yes    Types: Marijuana    Comment: smokes THC occasionally per pt    Sexual activity: Not Currently    Birth control/protection: None  Other Topics Concern   Not on file  Social History Narrative   She is married.    Social Determinants of Health   Financial Resource Strain: Not on file  Food Insecurity: No Food Insecurity (01/10/2022)   Hunger Vital Sign    Worried About Running Out of Food in the Last Year: Never true    Ran Out of Food in the Last Year: Never true  Transportation Needs: Unmet Transportation Needs (01/10/2022)   PRAPARE - Hydrologist (Medical): Yes    Lack of Transportation (Non-Medical): Yes  Physical Activity: Not on file  Stress: Not on file  Social Connections: Not on file  Intimate Partner Violence: Not At Risk (01/10/2022)   Humiliation, Afraid, Rape, and Kick questionnaire    Fear of Current or Ex-Partner: No    Emotionally Abused: No    Physically Abused: No     Sexually Abused: No    Past Surgical History:  Procedure Laterality Date   AMPUTATION Left 02/01/2022   Procedure: AMPUTATION BELOW KNEE;  Surgeon: Algernon Huxley, MD;  Location: [facility];  Service: Vascular;  Laterality: Left;   BACK SURGERY N/A 2002   BONE BIOPSY Left 12/24/2021   Procedure: BONE BIOPSY-HEEL BONE;  Surgeon: Trula Slade, DPM;  Location: [facility];  Service: Podiatry;  Laterality: Left;   CYST EXCISION     lower back   INGUINAL LYMPH NODE BIOPSY Left 07/04/2016   Procedure: INGUINAL LYMPH NODE BIOPSY;  Surgeon: Christene Lye, MD;  Location: [facility];  Service: General;  Laterality: Left;   ORIF FEMUR FRACTURE Left 02/04/2020   Procedure: OPEN REDUCTION INTERNAL FIXATION (ORIF) DISTAL FEMUR FRACTURE;  Surgeon: Shona Needles, MD;  Location: [employer];  Service: Orthopedics;  Laterality: Left;   PORTACATH PLACEMENT N/A 07/22/2016   Procedure: INSERTION PORT-A-CATH;  Surgeon: Christene Lye, MD;  Location: [facility];  Service: General;  Laterality: N/A;   TONSILLECTOMY AND ADENOIDECTOMY     TUBAL LIGATION     WOUND DEBRIDEMENT Left 12/24/2021   Procedure: DEBRIDEMENT WOUND-HEEL ULCER;  Surgeon: Trula Slade, DPM;  Location: [facility];  Service: Podiatry;  Laterality: Left;    Family History  Problem Relation Age of Onset   COPD Mother    Diabetes Mother    Heart failure Mother    Alcohol abuse Father    Kidney disease Father    Kidney failure Father    Arthritis Sister    CAD Maternal Grandmother    Stroke Maternal Grandfather    Arthritis Sister    Mental illness Sister    Arthritis Brother     No Known Allergies     Latest Ref Rng & Units 02/06/2022    5:16 AM 02/04/2022    8:14 AM 02/03/2022    8:24 AM  CBC  WBC 4.0 - 10.5 K/uL 6.3  9.0  8.8   Hemoglobin 12.0 - 15.0 g/dL 7.5  7.3  7.4   Hematocrit 36.0 - 46.0 % 23.5  23.4  23.0   Platelets 150 - 400 K/uL 185  193  163       CMP     Component Value Date/Time   NA 138 02/06/2022  0516   NA 125 (L)  08/01/2013 2312   K 3.7 02/06/2022 0516   K 3.5 08/01/2013 2312   CL 105 02/06/2022 0516   CL 90 (L) 08/01/2013 2312   CO2 27 02/06/2022 0516   CO2 29 08/01/2013 2312   GLUCOSE 87 02/06/2022 0516   GLUCOSE 98 08/01/2013 2312   BUN 21 02/06/2022 0516   BUN 15 08/01/2013 2312   CREATININE 1.18 (H) 02/06/2022 0516   CREATININE 1.14 (H) 02/02/2018 1613   CALCIUM 8.7 (L) 02/06/2022 0516   CALCIUM 8.9 08/01/2013 2312   PROT 6.5 01/23/2022 0937   PROT 7.6 08/01/2013 2312   ALBUMIN 3.0 (L) 01/29/2022 0611   ALBUMIN 4.1 08/01/2013 2312   AST 21 01/23/2022 0937   AST 28 08/01/2013 2312   ALT 22 01/23/2022 0937   ALT 26 08/01/2013 2312   ALKPHOS 64 01/23/2022 0937   ALKPHOS 108 08/01/2013 2312   BILITOT 0.8 01/23/2022 0937   BILITOT 0.3 08/01/2013 2312   GFRNONAA 49 (L) 02/06/2022 0516   GFRNONAA 50 (L) 02/02/2018 1613   GFRAA 56 (L) 02/01/2020 0313   GFRAA 58 (L) 02/02/2018 1613     No results found.     Assessment & Plan:   1. Hx of BKA, left ([place]) The amputation site is healing well.  It is well approximated with no evidence of dehiscence.  The patient is able to straighten her knee accordingly.  Every other staple was removed today and the patient tolerated this well.  This was covered with a dry dressing.  Patient will return in approximately a week in order to have further staples removed.   Current Outpatient Medications on File Prior to Visit  Medication Sig Dispense Refill   acetaminophen (TYLENOL) 500 MG tablet Take 1,000 mg by mouth every 8 (eight) hours as needed for moderate pain.     amLODipine (NORVASC) 5 MG tablet Take 5 mg by mouth daily.     atorvastatin (LIPITOR) 10 MG tablet Take 10 mg by mouth at bedtime.      budesonide-formoterol (SYMBICORT) 160-4.5 MCG/ACT inhaler Inhale 2 puffs into the lungs 2 (two) times daily.      buPROPion (WELLBUTRIN XL) 300 MG 24 hr tablet Take 300 mg by mouth daily.     clonazePAM (KLONOPIN) 0.5 MG tablet Take  1 tablet (0.5 mg total) by mouth 2 (two) times daily. 10 tablet 0   docusate sodium (COLACE) 100 MG capsule Take 1 capsule (100 mg total) by mouth 2 (two) times daily as needed.     DULoxetine (CYMBALTA) 60 MG capsule Take 1 capsule (60 mg total) by mouth every morning. 30 capsule 3   gabapentin (NEURONTIN) 600 MG tablet Take 600 mg by mouth 3 (three) times daily.     Multiple Vitamin (MULTIVITAMIN WITH MINERALS) TABS tablet Take 1 tablet by mouth daily.     MYRBETRIQ 50 MG TB24 tablet TAKE ONE TABLET BY MOUTH ONCE DAILY 30 tablet 0   oxyCODONE (OXY IR/ROXICODONE) 5 MG immediate release tablet Take 1 tablet (5 mg total) by mouth every 8 (eight) hours as needed for moderate pain or severe pain. 10 tablet 0   QUEtiapine Fumarate (SEROQUEL XR) 150 MG 24 hr tablet Take 150 mg by mouth at bedtime.   4   torsemide (DEMADEX) 20 MG tablet Take 1 tablet (20 mg total) by mouth daily as needed (edema).     traZODone (DESYREL) 50 MG tablet Take 50 mg by mouth at bedtime.     vortioxetine HBr (TRINTELLIX) 5 MG  TABS tablet Take 5 mg by mouth daily.     Current Facility-Administered Medications on File Prior to Visit  Medication Dose Route Frequency Provider Last Rate Last Admin   heparin lock flush 100 unit/mL  500 Units Intracatheter Once PRN Sindy Guadel[place], MD        There are no Patient Instructions on file for this visit. No follow-ups on file.   Kris Hartmann, NP

## 2022-02-25 ENCOUNTER — Encounter (INDEPENDENT_AMBULATORY_CARE_PROVIDER_SITE_OTHER): Payer: Self-pay

## 2022-02-26 DIAGNOSIS — G4733 Obstructive sleep apnea (adult) (pediatric): Secondary | ICD-10-CM | POA: Diagnosis not present

## 2022-02-26 DIAGNOSIS — S0120XS Unspecified open wound of nose, sequela: Secondary | ICD-10-CM | POA: Diagnosis not present

## 2022-02-26 DIAGNOSIS — N1831 Chronic kidney disease, stage 3a: Secondary | ICD-10-CM | POA: Diagnosis not present

## 2022-02-26 DIAGNOSIS — J449 Chronic obstructive pulmonary disease, unspecified: Secondary | ICD-10-CM | POA: Diagnosis not present

## 2022-02-26 DIAGNOSIS — E782 Mixed hyperlipidemia: Secondary | ICD-10-CM | POA: Diagnosis not present

## 2022-02-26 DIAGNOSIS — K0889 Other specified disorders of teeth and supporting structures: Secondary | ICD-10-CM | POA: Diagnosis not present

## 2022-02-26 DIAGNOSIS — Z89511 Acquired absence of right leg below knee: Secondary | ICD-10-CM | POA: Diagnosis not present

## 2022-02-26 DIAGNOSIS — I5032 Chronic diastolic (congestive) heart failure: Secondary | ICD-10-CM | POA: Diagnosis not present

## 2022-02-26 DIAGNOSIS — I10 Essential (primary) hypertension: Secondary | ICD-10-CM | POA: Diagnosis not present

## 2022-02-26 DIAGNOSIS — I480 Paroxysmal atrial fibrillation: Secondary | ICD-10-CM | POA: Diagnosis not present

## 2022-03-01 DIAGNOSIS — K0889 Other specified disorders of teeth and supporting structures: Secondary | ICD-10-CM | POA: Diagnosis not present

## 2022-03-01 DIAGNOSIS — S0120XS Unspecified open wound of nose, sequela: Secondary | ICD-10-CM | POA: Diagnosis not present

## 2022-03-05 DIAGNOSIS — Z89511 Acquired absence of right leg below knee: Secondary | ICD-10-CM | POA: Diagnosis not present

## 2022-03-05 DIAGNOSIS — B379 Candidiasis, unspecified: Secondary | ICD-10-CM | POA: Diagnosis not present

## 2022-03-05 DIAGNOSIS — K0889 Other specified disorders of teeth and supporting structures: Secondary | ICD-10-CM | POA: Diagnosis not present

## 2022-03-05 DIAGNOSIS — S0120XS Unspecified open wound of nose, sequela: Secondary | ICD-10-CM | POA: Diagnosis not present

## 2022-03-07 ENCOUNTER — Ambulatory Visit (INDEPENDENT_AMBULATORY_CARE_PROVIDER_SITE_OTHER): Payer: Medicare HMO | Admitting: Nurse Practitioner

## 2022-03-11 ENCOUNTER — Ambulatory Visit (INDEPENDENT_AMBULATORY_CARE_PROVIDER_SITE_OTHER): Payer: Medicare HMO | Admitting: Nurse Practitioner

## 2022-03-11 ENCOUNTER — Encounter (INDEPENDENT_AMBULATORY_CARE_PROVIDER_SITE_OTHER): Payer: Self-pay | Admitting: Nurse Practitioner

## 2022-03-11 VITALS — BP 123/71 | HR 49 | Resp 17 | Ht 64.0 in

## 2022-03-11 DIAGNOSIS — Z89512 Acquired absence of left leg below knee: Secondary | ICD-10-CM

## 2022-03-12 ENCOUNTER — Telehealth (INDEPENDENT_AMBULATORY_CARE_PROVIDER_SITE_OTHER): Payer: Self-pay

## 2022-03-12 DIAGNOSIS — J449 Chronic obstructive pulmonary disease, unspecified: Secondary | ICD-10-CM | POA: Diagnosis not present

## 2022-03-12 DIAGNOSIS — Z89511 Acquired absence of right leg below knee: Secondary | ICD-10-CM | POA: Diagnosis not present

## 2022-03-12 DIAGNOSIS — S0120XS Unspecified open wound of nose, sequela: Secondary | ICD-10-CM | POA: Diagnosis not present

## 2022-03-12 DIAGNOSIS — I10 Essential (primary) hypertension: Secondary | ICD-10-CM | POA: Diagnosis not present

## 2022-03-12 NOTE — Telephone Encounter (Signed)
Pt was seen in office yesterday and Janett Billow a PT assistant states that she was told pt needs a stump shrinker.  Janett Billow states she cannot order this and needs someone here to take care of this and call her back at [phone] ext [phone]

## 2022-03-12 NOTE — Telephone Encounter (Signed)
Spoke with Janett Billow and let her know that the order was sent to [facility] and she would have to contact them to get the pt fitted.

## 2022-03-12 NOTE — Telephone Encounter (Signed)
Yes, we will send that in

## 2022-03-13 ENCOUNTER — Ambulatory Visit: Payer: Medicare HMO | Admitting: Internal Medicine

## 2022-03-18 DIAGNOSIS — R2681 Unsteadiness on feet: Secondary | ICD-10-CM | POA: Diagnosis not present

## 2022-03-18 DIAGNOSIS — G629 Polyneuropathy, unspecified: Secondary | ICD-10-CM | POA: Diagnosis not present

## 2022-03-18 DIAGNOSIS — N312 Flaccid neuropathic bladder, not elsewhere classified: Secondary | ICD-10-CM | POA: Diagnosis not present

## 2022-03-18 DIAGNOSIS — M6259 Muscle wasting and atrophy, not elsewhere classified, multiple sites: Secondary | ICD-10-CM | POA: Diagnosis not present

## 2022-03-18 DIAGNOSIS — K5900 Constipation, unspecified: Secondary | ICD-10-CM | POA: Diagnosis not present

## 2022-03-18 DIAGNOSIS — G4733 Obstructive sleep apnea (adult) (pediatric): Secondary | ICD-10-CM | POA: Diagnosis not present

## 2022-03-18 DIAGNOSIS — G35 Multiple sclerosis: Secondary | ICD-10-CM | POA: Diagnosis not present

## 2022-03-19 ENCOUNTER — Telehealth (INDEPENDENT_AMBULATORY_CARE_PROVIDER_SITE_OTHER): Payer: Self-pay

## 2022-03-19 DIAGNOSIS — N312 Flaccid neuropathic bladder, not elsewhere classified: Secondary | ICD-10-CM | POA: Diagnosis not present

## 2022-03-19 DIAGNOSIS — G4733 Obstructive sleep apnea (adult) (pediatric): Secondary | ICD-10-CM | POA: Diagnosis not present

## 2022-03-19 DIAGNOSIS — R2681 Unsteadiness on feet: Secondary | ICD-10-CM | POA: Diagnosis not present

## 2022-03-19 DIAGNOSIS — K5900 Constipation, unspecified: Secondary | ICD-10-CM | POA: Diagnosis not present

## 2022-03-19 DIAGNOSIS — F331 Major depressive disorder, recurrent, moderate: Secondary | ICD-10-CM | POA: Diagnosis not present

## 2022-03-19 DIAGNOSIS — M6259 Muscle wasting and atrophy, not elsewhere classified, multiple sites: Secondary | ICD-10-CM | POA: Diagnosis not present

## 2022-03-19 DIAGNOSIS — G35 Multiple sclerosis: Secondary | ICD-10-CM | POA: Diagnosis not present

## 2022-03-19 DIAGNOSIS — F411 Generalized anxiety disorder: Secondary | ICD-10-CM | POA: Diagnosis not present

## 2022-03-19 DIAGNOSIS — G629 Polyneuropathy, unspecified: Secondary | ICD-10-CM | POA: Diagnosis not present

## 2022-03-19 NOTE — Telephone Encounter (Signed)
Brittney would like to get the order for shrinker sent to [facility]

## 2022-03-20 DIAGNOSIS — M6259 Muscle wasting and atrophy, not elsewhere classified, multiple sites: Secondary | ICD-10-CM | POA: Diagnosis not present

## 2022-03-20 DIAGNOSIS — K5900 Constipation, unspecified: Secondary | ICD-10-CM | POA: Diagnosis not present

## 2022-03-20 DIAGNOSIS — G4733 Obstructive sleep apnea (adult) (pediatric): Secondary | ICD-10-CM | POA: Diagnosis not present

## 2022-03-20 DIAGNOSIS — N312 Flaccid neuropathic bladder, not elsewhere classified: Secondary | ICD-10-CM | POA: Diagnosis not present

## 2022-03-20 DIAGNOSIS — G35 Multiple sclerosis: Secondary | ICD-10-CM | POA: Diagnosis not present

## 2022-03-20 DIAGNOSIS — G629 Polyneuropathy, unspecified: Secondary | ICD-10-CM | POA: Diagnosis not present

## 2022-03-20 DIAGNOSIS — R2681 Unsteadiness on feet: Secondary | ICD-10-CM | POA: Diagnosis not present

## 2022-03-22 ENCOUNTER — Encounter: Payer: Self-pay | Admitting: Podiatry

## 2022-03-22 DIAGNOSIS — N312 Flaccid neuropathic bladder, not elsewhere classified: Secondary | ICD-10-CM | POA: Diagnosis not present

## 2022-03-22 DIAGNOSIS — G4733 Obstructive sleep apnea (adult) (pediatric): Secondary | ICD-10-CM | POA: Diagnosis not present

## 2022-03-22 DIAGNOSIS — R2681 Unsteadiness on feet: Secondary | ICD-10-CM | POA: Diagnosis not present

## 2022-03-22 DIAGNOSIS — G35 Multiple sclerosis: Secondary | ICD-10-CM | POA: Diagnosis not present

## 2022-03-22 DIAGNOSIS — G629 Polyneuropathy, unspecified: Secondary | ICD-10-CM | POA: Diagnosis not present

## 2022-03-22 DIAGNOSIS — K5900 Constipation, unspecified: Secondary | ICD-10-CM | POA: Diagnosis not present

## 2022-03-22 DIAGNOSIS — M6259 Muscle wasting and atrophy, not elsewhere classified, multiple sites: Secondary | ICD-10-CM | POA: Diagnosis not present

## 2022-03-24 ENCOUNTER — Encounter (INDEPENDENT_AMBULATORY_CARE_PROVIDER_SITE_OTHER): Payer: Self-pay | Admitting: Nurse Practitioner

## 2022-03-24 DIAGNOSIS — G629 Polyneuropathy, unspecified: Secondary | ICD-10-CM | POA: Diagnosis not present

## 2022-03-24 DIAGNOSIS — G35 Multiple sclerosis: Secondary | ICD-10-CM | POA: Diagnosis not present

## 2022-03-24 DIAGNOSIS — N312 Flaccid neuropathic bladder, not elsewhere classified: Secondary | ICD-10-CM | POA: Diagnosis not present

## 2022-03-24 DIAGNOSIS — K5900 Constipation, unspecified: Secondary | ICD-10-CM | POA: Diagnosis not present

## 2022-03-24 DIAGNOSIS — G4733 Obstructive sleep apnea (adult) (pediatric): Secondary | ICD-10-CM | POA: Diagnosis not present

## 2022-03-24 DIAGNOSIS — M6259 Muscle wasting and atrophy, not elsewhere classified, multiple sites: Secondary | ICD-10-CM | POA: Diagnosis not present

## 2022-03-24 DIAGNOSIS — R2681 Unsteadiness on feet: Secondary | ICD-10-CM | POA: Diagnosis not present

## 2022-03-24 NOTE — Progress Notes (Signed)
Subjective:    Patient ID: [name], female    DOB: 12/14/1950, 71 y.o.   MRN: [id number] No chief complaint on file.   [name] is a 71 year old female who presented to [facility] following worsening infection of her left foot.  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  The patient has underwent multiple rounds of antibiotics with lack of healing.  The patient had noninvasive studies which showed adequate perfusion for wound healing however despite this there was a clear lack of improvement over several months.  The patient underwent a left below-knee amputation on 02/01/2022.  The patient has had all staples removed.  The patient is healing well.  No evidence of dehiscence.    Review of Systems  Neurological:  Positive for weakness.  All other systems reviewed and are negative.      Objective:   Physical Exam Vitals reviewed.  HENT:     Head: Normocephalic.  Cardiovascular:     Rate and Rhythm: Normal rate.  Pulmonary:     Effort: Pulmonary effort is normal.  Skin:    General: Skin is warm and dry.  Neurological:     Mental Status: She is alert and oriented to person, place, and time.     Motor: Weakness present.     Gait: Gait abnormal.  Psychiatric:        Mood and Affect: Mood normal.        Behavior: Behavior normal.        Thought Content: Thought content normal.        Judgment: Judgment normal.     BP 123/71 (BP Location: Right Arm)   Pulse (!) 49   Resp 17   Ht '5\' 4"'$  (1.626 m)   BMI 49.57 kg/m   Past Medical History:  Diagnosis Date   Abdominal aortic atherosclerosis ([place]) 11/11/2016   ADHD    Anxiety    COPD (chronic obstructive pulmonary disease) (HCC)    Depression    major depressive   Dyspnea    doe   Edema    left leg   Follicular lymphoma (HCC)    B Cell   Follicular lymphoma grade II (HCC)    Hypertension    Hypotension    idiopathic   Kyphoscoliosis and scoliosis 11/26/2011    Morbid obesity ([place]) 01/05/2016   Multiple sclerosis (HCC)    Multiple sclerosis (HCC)    1980's   Neuromuscular disorder (HCC)    Obstructive and reflux uropathy    foley   Pain    atypical facial   Peripheral vascular disease of lower extremity with ulceration ([place]) 11/08/2015   Skin ulcer ([place]) 11/08/2015   Weakness    generalized. has MS    Social History   Socioeconomic History   Marital status: Married    Spouse name: Not on file   Number of children: 3   Years of education: Not on file   Highest education level: Not on file  Occupational History   Occupation: disabled  Tobacco Use   Smoking status: Former    Packs/day: 1.00    Years: 20.00    Total pack years: 20.00    Types: Cigarettes    Start date: 04/30/1995    Quit date: 02/03/2016    Years since quitting: 6.1   Smokeless tobacco: Never  Vaping Use   Vaping Use: Some days  Substance and Sexual Activity   Alcohol use: No  Alcohol/week: 0.0 standard drinks of alcohol   Drug use: Yes    Types: Marijuana    Comment: smokes THC occasionally per pt    Sexual activity: Not Currently    Birth control/protection: None  Other Topics Concern   Not on file  Social History Narrative   She is married.    Social Determinants of Health   Financial Resource Strain: Not on file  Food Insecurity: No Food Insecurity (01/10/2022)   Hunger Vital Sign    Worried About Running Out of Food in the Last Year: Never true    Ran Out of Food in the Last Year: Never true  Transportation Needs: Unmet Transportation Needs (01/10/2022)   PRAPARE - Hydrologist (Medical): Yes    Lack of Transportation (Non-Medical): Yes  Physical Activity: Not on file  Stress: Not on file  Social Connections: Not on file  Intimate Partner Violence: Not At Risk (01/10/2022)   Humiliation, Afraid, Rape, and Kick questionnaire    Fear of Current or Ex-Partner: No    Emotionally Abused: No    Physically Abused: No     Sexually Abused: No    Past Surgical History:  Procedure Laterality Date   AMPUTATION Left 02/01/2022   Procedure: AMPUTATION BELOW KNEE;  Surgeon: Algernon Huxley, MD;  Location: [facility];  Service: Vascular;  Laterality: Left;   BACK SURGERY N/A 2002   BONE BIOPSY Left 12/24/2021   Procedure: BONE BIOPSY-HEEL BONE;  Surgeon: Trula Slade, DPM;  Location: [facility];  Service: Podiatry;  Laterality: Left;   CYST EXCISION     lower back   INGUINAL LYMPH NODE BIOPSY Left 07/04/2016   Procedure: INGUINAL LYMPH NODE BIOPSY;  Surgeon: Christene Lye, MD;  Location: [facility];  Service: General;  Laterality: Left;   ORIF FEMUR FRACTURE Left 02/04/2020   Procedure: OPEN REDUCTION INTERNAL FIXATION (ORIF) DISTAL FEMUR FRACTURE;  Surgeon: Shona Needles, MD;  Location: [facility];  Service: Orthopedics;  Laterality: Left;   PORTACATH PLACEMENT N/A 07/22/2016   Procedure: INSERTION PORT-A-CATH;  Surgeon: Christene Lye, MD;  Location: [facility];  Service: General;  Laterality: N/A;   TONSILLECTOMY AND ADENOIDECTOMY     TUBAL LIGATION     WOUND DEBRIDEMENT Left 12/24/2021   Procedure: DEBRIDEMENT WOUND-HEEL ULCER;  Surgeon: Trula Slade, DPM;  Location: [facility];  Service: Podiatry;  Laterality: Left;    Family History  Problem Relation Age of Onset   COPD Mother    Diabetes Mother    Heart failure Mother    Alcohol abuse Father    Kidney disease Father    Kidney failure Father    Arthritis Sister    CAD Maternal Grandmother    Stroke Maternal Grandfather    Arthritis Sister    Mental illness Sister    Arthritis Brother     No Known Allergies     Latest Ref Rng & Units 02/06/2022    5:16 AM 02/04/2022    8:14 AM 02/03/2022    8:24 AM  CBC  WBC 4.0 - 10.5 K/uL 6.3  9.0  8.8   Hemoglobin 12.0 - 15.0 g/dL 7.5  7.3  7.4   Hematocrit 36.0 - 46.0 % 23.5  23.4  23.0   Platelets 150 - 400 K/uL 185  193  163       CMP     Component Value Date/Time   NA 138 02/06/2022  0516   NA 125 (L)  08/01/2013 2312   K 3.7 02/06/2022 0516   K 3.5 08/01/2013 2312   CL 105 02/06/2022 0516   CL 90 (L) 08/01/2013 2312   CO2 27 02/06/2022 0516   CO2 29 08/01/2013 2312   GLUCOSE 87 02/06/2022 0516   GLUCOSE 98 08/01/2013 2312   BUN 21 02/06/2022 0516   BUN 15 08/01/2013 2312   CREATININE 1.18 (H) 02/06/2022 0516   CREATININE 1.14 (H) 02/02/2018 1613   CALCIUM 8.7 (L) 02/06/2022 0516   CALCIUM 8.9 08/01/2013 2312   PROT 6.5 01/23/2022 0937   PROT 7.6 08/01/2013 2312   ALBUMIN 3.0 (L) 01/29/2022 0611   ALBUMIN 4.1 08/01/2013 2312   AST 21 01/23/2022 0937   AST 28 08/01/2013 2312   ALT 22 01/23/2022 0937   ALT 26 08/01/2013 2312   ALKPHOS 64 01/23/2022 0937   ALKPHOS 108 08/01/2013 2312   BILITOT 0.8 01/23/2022 0937   BILITOT 0.3 08/01/2013 2312   GFRNONAA 49 (L) 02/06/2022 0516   GFRNONAA 50 (L) 02/02/2018 1613   GFRAA 56 (L) 02/01/2020 0313   GFRAA 58 (L) 02/02/2018 1613     No results found.     Assessment & Plan:   1. Hx of BKA, left ([name]) Janayah Zavada was seen for further evaluation for left below knee prosthesis.She Is a 71 year old female who had amputation on 10/0/2023.  At the time he is well healed and ready for fitting of new below knee prosthesis.  He is a highly motivated individual and should do well once fitted with prosthesis.  She has no problem returning to a K2 ambulator, which will allow him to walk inside and outside of his home and overload level barriers.   Current Outpatient Medications on File Prior to Visit  Medication Sig Dispense Refill   acetaminophen (TYLENOL) 500 MG tablet Take 1,000 mg by mouth every 8 (eight) hours as needed for moderate pain.     amLODipine (NORVASC) 5 MG tablet Take 5 mg by mouth daily.     atorvastatin (LIPITOR) 10 MG tablet Take 10 mg by mouth at bedtime.      budesonide-formoterol (SYMBICORT) 160-4.5 MCG/ACT inhaler Inhale 2 puffs into the lungs 2 (two) times daily.      buPROPion (WELLBUTRIN XL) 300  MG 24 hr tablet Take 300 mg by mouth daily.     clonazePAM (KLONOPIN) 0.5 MG tablet Take 1 tablet (0.5 mg total) by mouth 2 (two) times daily. 10 tablet 0   docusate sodium (COLACE) 100 MG capsule Take 1 capsule (100 mg total) by mouth 2 (two) times daily as needed.     DULoxetine (CYMBALTA) 60 MG capsule Take 1 capsule (60 mg total) by mouth every morning. 30 capsule 3   gabapentin (NEURONTIN) 600 MG tablet Take 600 mg by mouth 3 (three) times daily.     Multiple Vitamin (MULTIVITAMIN WITH MINERALS) TABS tablet Take 1 tablet by mouth daily.     MYRBETRIQ 50 MG TB24 tablet TAKE ONE TABLET BY MOUTH ONCE DAILY 30 tablet 0   oxyCODONE (OXY IR/ROXICODONE) 5 MG immediate release tablet Take 1 tablet (5 mg total) by mouth every 8 (eight) hours as needed for moderate pain or severe pain. 10 tablet 0   QUEtiapine Fumarate (SEROQUEL XR) 150 MG 24 hr tablet Take 150 mg by mouth at bedtime.   4   torsemide (DEMADEX) 20 MG tablet Take 1 tablet (20 mg total) by mouth daily as needed (edema).     traZODone (DESYREL)  50 MG tablet Take 50 mg by mouth at bedtime.     vortioxetine HBr (TRINTELLIX) 5 MG TABS tablet Take 5 mg by mouth daily.     Current Facility-Administered Medications on File Prior to Visit  Medication Dose Route Frequency Provider Last Rate Last Admin   heparin lock flush 100 unit/mL  500 Units Intracatheter Once PRN Sindy Guadeloupe, MD        There are no Patient Instructions on file for this visit. No follow-ups on file.   Kris Hartmann, NP

## 2022-03-25 DIAGNOSIS — N312 Flaccid neuropathic bladder, not elsewhere classified: Secondary | ICD-10-CM | POA: Diagnosis not present

## 2022-03-25 DIAGNOSIS — K5900 Constipation, unspecified: Secondary | ICD-10-CM | POA: Diagnosis not present

## 2022-03-25 DIAGNOSIS — M6259 Muscle wasting and atrophy, not elsewhere classified, multiple sites: Secondary | ICD-10-CM | POA: Diagnosis not present

## 2022-03-25 DIAGNOSIS — G4733 Obstructive sleep apnea (adult) (pediatric): Secondary | ICD-10-CM | POA: Diagnosis not present

## 2022-03-25 DIAGNOSIS — G35 Multiple sclerosis: Secondary | ICD-10-CM | POA: Diagnosis not present

## 2022-03-25 DIAGNOSIS — R2681 Unsteadiness on feet: Secondary | ICD-10-CM | POA: Diagnosis not present

## 2022-03-25 DIAGNOSIS — R8290 Unspecified abnormal findings in urine: Secondary | ICD-10-CM | POA: Diagnosis not present

## 2022-03-25 DIAGNOSIS — G629 Polyneuropathy, unspecified: Secondary | ICD-10-CM | POA: Diagnosis not present

## 2022-03-26 DIAGNOSIS — J449 Chronic obstructive pulmonary disease, unspecified: Secondary | ICD-10-CM | POA: Diagnosis not present

## 2022-03-26 DIAGNOSIS — R2681 Unsteadiness on feet: Secondary | ICD-10-CM | POA: Diagnosis not present

## 2022-03-26 DIAGNOSIS — M6259 Muscle wasting and atrophy, not elsewhere classified, multiple sites: Secondary | ICD-10-CM | POA: Diagnosis not present

## 2022-03-26 DIAGNOSIS — G4733 Obstructive sleep apnea (adult) (pediatric): Secondary | ICD-10-CM | POA: Diagnosis not present

## 2022-03-26 DIAGNOSIS — G35 Multiple sclerosis: Secondary | ICD-10-CM | POA: Diagnosis not present

## 2022-03-26 DIAGNOSIS — G629 Polyneuropathy, unspecified: Secondary | ICD-10-CM | POA: Diagnosis not present

## 2022-03-26 DIAGNOSIS — K5900 Constipation, unspecified: Secondary | ICD-10-CM | POA: Diagnosis not present

## 2022-03-26 DIAGNOSIS — F331 Major depressive disorder, recurrent, moderate: Secondary | ICD-10-CM | POA: Diagnosis not present

## 2022-03-26 DIAGNOSIS — N1831 Chronic kidney disease, stage 3a: Secondary | ICD-10-CM | POA: Diagnosis not present

## 2022-03-26 DIAGNOSIS — N390 Urinary tract infection, site not specified: Secondary | ICD-10-CM | POA: Diagnosis not present

## 2022-03-26 DIAGNOSIS — N312 Flaccid neuropathic bladder, not elsewhere classified: Secondary | ICD-10-CM | POA: Diagnosis not present

## 2022-03-29 DIAGNOSIS — N139 Obstructive and reflux uropathy, unspecified: Secondary | ICD-10-CM | POA: Diagnosis not present

## 2022-03-29 DIAGNOSIS — I739 Peripheral vascular disease, unspecified: Secondary | ICD-10-CM | POA: Diagnosis not present

## 2022-03-29 DIAGNOSIS — R2681 Unsteadiness on feet: Secondary | ICD-10-CM | POA: Diagnosis not present

## 2022-03-29 DIAGNOSIS — Z741 Need for assistance with personal care: Secondary | ICD-10-CM | POA: Diagnosis not present

## 2022-03-29 DIAGNOSIS — M6259 Muscle wasting and atrophy, not elsewhere classified, multiple sites: Secondary | ICD-10-CM | POA: Diagnosis not present

## 2022-03-29 DIAGNOSIS — F909 Attention-deficit hyperactivity disorder, unspecified type: Secondary | ICD-10-CM | POA: Diagnosis not present

## 2022-03-29 DIAGNOSIS — I10 Essential (primary) hypertension: Secondary | ICD-10-CM | POA: Diagnosis not present

## 2022-03-29 DIAGNOSIS — Z4781 Encounter for orthopedic aftercare following surgical amputation: Secondary | ICD-10-CM | POA: Diagnosis not present

## 2022-03-29 DIAGNOSIS — G35 Multiple sclerosis: Secondary | ICD-10-CM | POA: Diagnosis not present

## 2022-03-30 DIAGNOSIS — F909 Attention-deficit hyperactivity disorder, unspecified type: Secondary | ICD-10-CM | POA: Diagnosis not present

## 2022-03-30 DIAGNOSIS — G35 Multiple sclerosis: Secondary | ICD-10-CM | POA: Diagnosis not present

## 2022-03-30 DIAGNOSIS — I739 Peripheral vascular disease, unspecified: Secondary | ICD-10-CM | POA: Diagnosis not present

## 2022-03-30 DIAGNOSIS — I10 Essential (primary) hypertension: Secondary | ICD-10-CM | POA: Diagnosis not present

## 2022-03-30 DIAGNOSIS — Z741 Need for assistance with personal care: Secondary | ICD-10-CM | POA: Diagnosis not present

## 2022-03-30 DIAGNOSIS — Z4781 Encounter for orthopedic aftercare following surgical amputation: Secondary | ICD-10-CM | POA: Diagnosis not present

## 2022-03-30 DIAGNOSIS — N139 Obstructive and reflux uropathy, unspecified: Secondary | ICD-10-CM | POA: Diagnosis not present

## 2022-03-30 DIAGNOSIS — M6259 Muscle wasting and atrophy, not elsewhere classified, multiple sites: Secondary | ICD-10-CM | POA: Diagnosis not present

## 2022-03-30 DIAGNOSIS — R2681 Unsteadiness on feet: Secondary | ICD-10-CM | POA: Diagnosis not present

## 2022-03-31 DIAGNOSIS — I739 Peripheral vascular disease, unspecified: Secondary | ICD-10-CM | POA: Diagnosis not present

## 2022-03-31 DIAGNOSIS — I10 Essential (primary) hypertension: Secondary | ICD-10-CM | POA: Diagnosis not present

## 2022-03-31 DIAGNOSIS — G35 Multiple sclerosis: Secondary | ICD-10-CM | POA: Diagnosis not present

## 2022-03-31 DIAGNOSIS — Z741 Need for assistance with personal care: Secondary | ICD-10-CM | POA: Diagnosis not present

## 2022-03-31 DIAGNOSIS — Z4781 Encounter for orthopedic aftercare following surgical amputation: Secondary | ICD-10-CM | POA: Diagnosis not present

## 2022-03-31 DIAGNOSIS — M6259 Muscle wasting and atrophy, not elsewhere classified, multiple sites: Secondary | ICD-10-CM | POA: Diagnosis not present

## 2022-03-31 DIAGNOSIS — R2681 Unsteadiness on feet: Secondary | ICD-10-CM | POA: Diagnosis not present

## 2022-03-31 DIAGNOSIS — N139 Obstructive and reflux uropathy, unspecified: Secondary | ICD-10-CM | POA: Diagnosis not present

## 2022-03-31 DIAGNOSIS — F909 Attention-deficit hyperactivity disorder, unspecified type: Secondary | ICD-10-CM | POA: Diagnosis not present

## 2022-04-01 DIAGNOSIS — R2681 Unsteadiness on feet: Secondary | ICD-10-CM | POA: Diagnosis not present

## 2022-04-01 DIAGNOSIS — Z741 Need for assistance with personal care: Secondary | ICD-10-CM | POA: Diagnosis not present

## 2022-04-01 DIAGNOSIS — G35 Multiple sclerosis: Secondary | ICD-10-CM | POA: Diagnosis not present

## 2022-04-01 DIAGNOSIS — I739 Peripheral vascular disease, unspecified: Secondary | ICD-10-CM | POA: Diagnosis not present

## 2022-04-01 DIAGNOSIS — F909 Attention-deficit hyperactivity disorder, unspecified type: Secondary | ICD-10-CM | POA: Diagnosis not present

## 2022-04-01 DIAGNOSIS — I10 Essential (primary) hypertension: Secondary | ICD-10-CM | POA: Diagnosis not present

## 2022-04-01 DIAGNOSIS — M6259 Muscle wasting and atrophy, not elsewhere classified, multiple sites: Secondary | ICD-10-CM | POA: Diagnosis not present

## 2022-04-01 DIAGNOSIS — N139 Obstructive and reflux uropathy, unspecified: Secondary | ICD-10-CM | POA: Diagnosis not present

## 2022-04-01 DIAGNOSIS — Z4781 Encounter for orthopedic aftercare following surgical amputation: Secondary | ICD-10-CM | POA: Diagnosis not present

## 2022-04-02 DIAGNOSIS — M6259 Muscle wasting and atrophy, not elsewhere classified, multiple sites: Secondary | ICD-10-CM | POA: Diagnosis not present

## 2022-04-02 DIAGNOSIS — F909 Attention-deficit hyperactivity disorder, unspecified type: Secondary | ICD-10-CM | POA: Diagnosis not present

## 2022-04-02 DIAGNOSIS — I10 Essential (primary) hypertension: Secondary | ICD-10-CM | POA: Diagnosis not present

## 2022-04-02 DIAGNOSIS — R2681 Unsteadiness on feet: Secondary | ICD-10-CM | POA: Diagnosis not present

## 2022-04-02 DIAGNOSIS — Z741 Need for assistance with personal care: Secondary | ICD-10-CM | POA: Diagnosis not present

## 2022-04-02 DIAGNOSIS — I739 Peripheral vascular disease, unspecified: Secondary | ICD-10-CM | POA: Diagnosis not present

## 2022-04-02 DIAGNOSIS — Z4781 Encounter for orthopedic aftercare following surgical amputation: Secondary | ICD-10-CM | POA: Diagnosis not present

## 2022-04-02 DIAGNOSIS — N139 Obstructive and reflux uropathy, unspecified: Secondary | ICD-10-CM | POA: Diagnosis not present

## 2022-04-02 DIAGNOSIS — G35 Multiple sclerosis: Secondary | ICD-10-CM | POA: Diagnosis not present

## 2022-04-03 DIAGNOSIS — F909 Attention-deficit hyperactivity disorder, unspecified type: Secondary | ICD-10-CM | POA: Diagnosis not present

## 2022-04-03 DIAGNOSIS — I10 Essential (primary) hypertension: Secondary | ICD-10-CM | POA: Diagnosis not present

## 2022-04-03 DIAGNOSIS — N139 Obstructive and reflux uropathy, unspecified: Secondary | ICD-10-CM | POA: Diagnosis not present

## 2022-04-03 DIAGNOSIS — R2681 Unsteadiness on feet: Secondary | ICD-10-CM | POA: Diagnosis not present

## 2022-04-03 DIAGNOSIS — Z741 Need for assistance with personal care: Secondary | ICD-10-CM | POA: Diagnosis not present

## 2022-04-03 DIAGNOSIS — M6259 Muscle wasting and atrophy, not elsewhere classified, multiple sites: Secondary | ICD-10-CM | POA: Diagnosis not present

## 2022-04-03 DIAGNOSIS — I739 Peripheral vascular disease, unspecified: Secondary | ICD-10-CM | POA: Diagnosis not present

## 2022-04-03 DIAGNOSIS — Z4781 Encounter for orthopedic aftercare following surgical amputation: Secondary | ICD-10-CM | POA: Diagnosis not present

## 2022-04-03 DIAGNOSIS — G35 Multiple sclerosis: Secondary | ICD-10-CM | POA: Diagnosis not present

## 2022-04-03 NOTE — Telephone Encounter (Signed)
Faxed to [facility]

## 2022-04-04 DIAGNOSIS — I10 Essential (primary) hypertension: Secondary | ICD-10-CM | POA: Diagnosis not present

## 2022-04-04 DIAGNOSIS — M6259 Muscle wasting and atrophy, not elsewhere classified, multiple sites: Secondary | ICD-10-CM | POA: Diagnosis not present

## 2022-04-04 DIAGNOSIS — G35 Multiple sclerosis: Secondary | ICD-10-CM | POA: Diagnosis not present

## 2022-04-04 DIAGNOSIS — Z4781 Encounter for orthopedic aftercare following surgical amputation: Secondary | ICD-10-CM | POA: Diagnosis not present

## 2022-04-04 DIAGNOSIS — R2681 Unsteadiness on feet: Secondary | ICD-10-CM | POA: Diagnosis not present

## 2022-04-04 DIAGNOSIS — Z741 Need for assistance with personal care: Secondary | ICD-10-CM | POA: Diagnosis not present

## 2022-04-04 DIAGNOSIS — N139 Obstructive and reflux uropathy, unspecified: Secondary | ICD-10-CM | POA: Diagnosis not present

## 2022-04-04 DIAGNOSIS — I739 Peripheral vascular disease, unspecified: Secondary | ICD-10-CM | POA: Diagnosis not present

## 2022-04-04 DIAGNOSIS — F909 Attention-deficit hyperactivity disorder, unspecified type: Secondary | ICD-10-CM | POA: Diagnosis not present

## 2022-04-07 DIAGNOSIS — Z4781 Encounter for orthopedic aftercare following surgical amputation: Secondary | ICD-10-CM | POA: Diagnosis not present

## 2022-04-07 DIAGNOSIS — M6259 Muscle wasting and atrophy, not elsewhere classified, multiple sites: Secondary | ICD-10-CM | POA: Diagnosis not present

## 2022-04-07 DIAGNOSIS — G35 Multiple sclerosis: Secondary | ICD-10-CM | POA: Diagnosis not present

## 2022-04-07 DIAGNOSIS — Z741 Need for assistance with personal care: Secondary | ICD-10-CM | POA: Diagnosis not present

## 2022-04-07 DIAGNOSIS — N139 Obstructive and reflux uropathy, unspecified: Secondary | ICD-10-CM | POA: Diagnosis not present

## 2022-04-07 DIAGNOSIS — I739 Peripheral vascular disease, unspecified: Secondary | ICD-10-CM | POA: Diagnosis not present

## 2022-04-07 DIAGNOSIS — F909 Attention-deficit hyperactivity disorder, unspecified type: Secondary | ICD-10-CM | POA: Diagnosis not present

## 2022-04-07 DIAGNOSIS — R2681 Unsteadiness on feet: Secondary | ICD-10-CM | POA: Diagnosis not present

## 2022-04-07 DIAGNOSIS — I10 Essential (primary) hypertension: Secondary | ICD-10-CM | POA: Diagnosis not present

## 2022-04-09 DIAGNOSIS — I959 Hypotension, unspecified: Secondary | ICD-10-CM | POA: Diagnosis not present

## 2022-04-09 DIAGNOSIS — B379 Candidiasis, unspecified: Secondary | ICD-10-CM | POA: Diagnosis not present

## 2022-04-09 DIAGNOSIS — Z89511 Acquired absence of right leg below knee: Secondary | ICD-10-CM | POA: Diagnosis not present

## 2022-04-09 DIAGNOSIS — S0120XS Unspecified open wound of nose, sequela: Secondary | ICD-10-CM | POA: Diagnosis not present

## 2022-04-10 ENCOUNTER — Ambulatory Visit (INDEPENDENT_AMBULATORY_CARE_PROVIDER_SITE_OTHER): Payer: Medicare HMO | Admitting: Nurse Practitioner

## 2022-04-10 ENCOUNTER — Encounter (INDEPENDENT_AMBULATORY_CARE_PROVIDER_SITE_OTHER): Payer: Self-pay | Admitting: Nurse Practitioner

## 2022-04-10 VITALS — BP 124/81 | HR 66 | Resp 16

## 2022-04-10 DIAGNOSIS — Z89512 Acquired absence of left leg below knee: Secondary | ICD-10-CM

## 2022-04-10 NOTE — Progress Notes (Signed)
Subjective:    Patient ID: [name], female    DOB: 1951/02/23, 71 y.o.   MRN: [id number] Chief Complaint  Patient presents with   Follow-up    Ultrasound follow up     [name] is a 71 year old female who presented to [facility] following worsening infection of her left foot.  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  The patient has underwent multiple rounds of antibiotics with lack of healing.  The patient had noninvasive studies which showed adequate perfusion for wound healing however despite this there was a clear lack of improvement over several months.  The patient underwent a left below-knee amputation on 02/01/2022.  The patient has had all staples removed.  The patient is healing well.  No evidence of dehiscence.  No evidence of contracture    Review of Systems  Neurological:  Positive for weakness.  All other systems reviewed and are negative.      Objective:   Physical Exam Vitals reviewed.  HENT:     Head: Normocephalic.  Cardiovascular:     Rate and Rhythm: Normal rate.  Pulmonary:     Effort: Pulmonary effort is normal.  Skin:    General: Skin is warm and dry.  Neurological:     Mental Status: She is alert and oriented to person, place, and time.     Motor: Weakness present.     Gait: Gait abnormal.  Psychiatric:        Mood and Affect: Mood normal.        Behavior: Behavior normal.        Thought Content: Thought content normal.        Judgment: Judgment normal.     BP 124/81 (BP Location: Left Arm)   Pulse 66   Resp 16   Past Medical History:  Diagnosis Date   Abdominal aortic atherosclerosis ([place]) 11/11/2016   ADHD    Anxiety    COPD (chronic obstructive pulmonary disease) (HCC)    Depression    major depressive   Dyspnea    doe   Edema    left leg   Follicular lymphoma (HCC)    B Cell   Follicular lymphoma grade II (HCC)    Hypertension    Hypotension    idiopathic    Kyphoscoliosis and scoliosis 11/26/2011   Morbid obesity ([place]) 01/05/2016   Multiple sclerosis (HCC)    Multiple sclerosis (HCC)    1980's   Neuromuscular disorder ([place])    Obstructive and reflux uropathy    foley   Pain    atypical facial   Peripheral vascular disease of lower extremity with ulceration ([place]) 11/08/2015   Skin ulcer ([place]) 11/08/2015   Weakness    generalized. has MS    Social History   Socioeconomic History   Marital status: Married    Spouse name: Not on file   Number of children: 3   Years of education: Not on file   Highest education level: Not on file  Occupational History   Occupation: disabled  Tobacco Use   Smoking status: Former    Packs/day: 1.00    Years: 20.00    Total pack years: 20.00    Types: Cigarettes    Start date: 04/30/1995    Quit date: 02/03/2016    Years since quitting: 6.1   Smokeless tobacco: Never  Vaping Use   Vaping Use: Some days  Substance and Sexual Activity  Alcohol use: No    Alcohol/week: 0.0 standard drinks of alcohol   Drug use: Yes    Types: Marijuana    Comment: smokes THC occasionally per pt    Sexual activity: Not Currently    Birth control/protection: None  Other Topics Concern   Not on file  Social History Narrative   She is married.    Social Determinants of Health   Financial Resource Strain: Not on file  Food Insecurity: No Food Insecurity (01/10/2022)   Hunger Vital Sign    Worried About Running Out of Food in the Last Year: Never true    Ran Out of Food in the Last Year: Never true  Transportation Needs: Unmet Transportation Needs (01/10/2022)   PRAPARE - Hydrologist (Medical): Yes    Lack of Transportation (Non-Medical): Yes  Physical Activity: Not on file  Stress: Not on file  Social Connections: Not on file  Intimate Partner Violence: Not At Risk (01/10/2022)   Humiliation, Afraid, Rape, and Kick questionnaire    Fear of Current or Ex-Partner: No    Emotionally  Abused: No    Physically Abused: No    Sexually Abused: No    Past Surgical History:  Procedure Laterality Date   AMPUTATION Left 02/01/2022   Procedure: AMPUTATION BELOW KNEE;  Surgeon: Algernon Huxley, MD;  Location: [facility];  Service: Vascular;  Laterality: Left;   BACK SURGERY N/A 2002   BONE BIOPSY Left 12/24/2021   Procedure: BONE BIOPSY-HEEL BONE;  Surgeon: Trula Slade, DPM;  Location: [facility];  Service: Podiatry;  Laterality: Left;   CYST EXCISION     lower back   INGUINAL LYMPH NODE BIOPSY Left 07/04/2016   Procedure: INGUINAL LYMPH NODE BIOPSY;  Surgeon: Christene Lye, MD;  Location: [facility];  Service: General;  Laterality: Left;   ORIF FEMUR FRACTURE Left 02/04/2020   Procedure: OPEN REDUCTION INTERNAL FIXATION (ORIF) DISTAL FEMUR FRACTURE;  Surgeon: Shona Needles, MD;  Location: [facility];  Service: Orthopedics;  Laterality: Left;   PORTACATH PLACEMENT N/A 07/22/2016   Procedure: INSERTION PORT-A-CATH;  Surgeon: Christene Lye, MD;  Location: [facility];  Service: General;  Laterality: N/A;   TONSILLECTOMY AND ADENOIDECTOMY     TUBAL LIGATION     WOUND DEBRIDEMENT Left 12/24/2021   Procedure: DEBRIDEMENT WOUND-HEEL ULCER;  Surgeon: Trula Slade, DPM;  Location: [facility];  Service: Podiatry;  Laterality: Left;    Family History  Problem Relation Age of Onset   COPD Mother    Diabetes Mother    Heart failure Mother    Alcohol abuse Father    Kidney disease Father    Kidney failure Father    Arthritis Sister    CAD Maternal Grandmother    Stroke Maternal Grandfather    Arthritis Sister    Mental illness Sister    Arthritis Brother     No Known Allergies     Latest Ref Rng & Units 02/06/2022    5:16 AM 02/04/2022    8:14 AM 02/03/2022    8:24 AM  CBC  WBC 4.0 - 10.5 K/uL 6.3  9.0  8.8   Hemoglobin 12.0 - 15.0 g/dL 7.5  7.3  7.4   Hematocrit 36.0 - 46.0 % 23.5  23.4  23.0   Platelets 150 - 400 K/uL 185  193  163       CMP     Component  Value Date/Time   NA 138 02/06/2022  0516   NA 125 (L) 08/01/2013 2312   K 3.7 02/06/2022 0516   K 3.5 08/01/2013 2312   CL 105 02/06/2022 0516   CL 90 (L) 08/01/2013 2312   CO2 27 02/06/2022 0516   CO2 29 08/01/2013 2312   GLUCOSE 87 02/06/2022 0516   GLUCOSE 98 08/01/2013 2312   BUN 21 02/06/2022 0516   BUN 15 08/01/2013 2312   CREATININE 1.18 (H) 02/06/2022 0516   CREATININE 1.14 (H) 02/02/2018 1613   CALCIUM 8.7 (L) 02/06/2022 0516   CALCIUM 8.9 08/01/2013 2312   PROT 6.5 01/23/2022 0937   PROT 7.6 08/01/2013 2312   ALBUMIN 3.0 (L) 01/29/2022 0611   ALBUMIN 4.1 08/01/2013 2312   AST 21 01/23/2022 0937   AST 28 08/01/2013 2312   ALT 22 01/23/2022 0937   ALT 26 08/01/2013 2312   ALKPHOS 64 01/23/2022 0937   ALKPHOS 108 08/01/2013 2312   BILITOT 0.8 01/23/2022 0937   BILITOT 0.3 08/01/2013 2312   GFRNONAA 49 (L) 02/06/2022 0516   GFRNONAA 50 (L) 02/02/2018 1613   GFRAA 56 (L) 02/01/2020 0313   GFRAA 58 (L) 02/02/2018 1613     No results found.     Assessment & Plan:   1. Hx of BKA, left ([place]) [name] was seen for further evaluation for left below knee prosthesis.She Is a 71 year old female who had amputation on 10/0/2023.  At the time he is well healed and ready for fitting of new below knee prosthesis.  He is a highly motivated individual and should do well once fitted with prosthesis.  She has no problem returning to a K2 ambulator, which will allow her to walk inside and outside of her home and overload level barriers.  We will have the patient return in 6 months with ABIs.  The patient was given a prescription for prosthetic today and we will also fax 1 to [facility]. Current Outpatient Medications on File Prior to Visit  Medication Sig Dispense Refill   acetaminophen (TYLENOL) 500 MG tablet Take 1,000 mg by mouth every 8 (eight) hours as needed for moderate pain.     amLODipine (NORVASC) 5 MG tablet Take 5 mg by mouth daily.     atorvastatin (LIPITOR) 10  MG tablet Take 10 mg by mouth at bedtime.      budesonide-formoterol (SYMBICORT) 160-4.5 MCG/ACT inhaler Inhale 2 puffs into the lungs 2 (two) times daily.      buPROPion (WELLBUTRIN XL) 300 MG 24 hr tablet Take 300 mg by mouth daily.     clonazePAM (KLONOPIN) 0.5 MG tablet Take 1 tablet (0.5 mg total) by mouth 2 (two) times daily. 10 tablet 0   docusate sodium (COLACE) 100 MG capsule Take 1 capsule (100 mg total) by mouth 2 (two) times daily as needed.     DULoxetine (CYMBALTA) 60 MG capsule Take 1 capsule (60 mg total) by mouth every morning. 30 capsule 3   gabapentin (NEURONTIN) 600 MG tablet Take 600 mg by mouth 3 (three) times daily.     Multiple Vitamin (MULTIVITAMIN WITH MINERALS) TABS tablet Take 1 tablet by mouth daily.     MYRBETRIQ 50 MG TB24 tablet TAKE ONE TABLET BY MOUTH ONCE DAILY 30 tablet 0   oxyCODONE (OXY IR/ROXICODONE) 5 MG immediate release tablet Take 1 tablet (5 mg total) by mouth every 8 (eight) hours as needed for moderate pain or severe pain. 10 tablet 0   QUEtiapine Fumarate (SEROQUEL XR) 150 MG 24 hr tablet  Take 150 mg by mouth at bedtime.   4   torsemide (DEMADEX) 20 MG tablet Take 1 tablet (20 mg total) by mouth daily as needed (edema).     traZODone (DESYREL) 50 MG tablet Take 50 mg by mouth at bedtime.     vortioxetine HBr (TRINTELLIX) 5 MG TABS tablet Take 5 mg by mouth daily.     Current Facility-Administered Medications on File Prior to Visit  Medication Dose Route Frequency Provider Last Rate Last Admin   heparin lock flush 100 unit/mL  500 Units Intracatheter Once PRN Sindy Guadeloupe, MD        There are no Patient Instructions on file for this visit. No follow-ups on file.   Kris Hartmann, NP

## 2022-04-11 DIAGNOSIS — E785 Hyperlipidemia, unspecified: Secondary | ICD-10-CM | POA: Diagnosis not present

## 2022-04-11 DIAGNOSIS — E559 Vitamin D deficiency, unspecified: Secondary | ICD-10-CM | POA: Diagnosis not present

## 2022-04-11 DIAGNOSIS — E612 Magnesium deficiency: Secondary | ICD-10-CM | POA: Diagnosis not present

## 2022-04-11 DIAGNOSIS — E039 Hypothyroidism, unspecified: Secondary | ICD-10-CM | POA: Diagnosis not present

## 2022-04-11 DIAGNOSIS — E119 Type 2 diabetes mellitus without complications: Secondary | ICD-10-CM | POA: Diagnosis not present

## 2022-04-11 DIAGNOSIS — N390 Urinary tract infection, site not specified: Secondary | ICD-10-CM | POA: Diagnosis not present

## 2022-04-15 DIAGNOSIS — D631 Anemia in chronic kidney disease: Secondary | ICD-10-CM | POA: Diagnosis not present

## 2022-04-15 DIAGNOSIS — E559 Vitamin D deficiency, unspecified: Secondary | ICD-10-CM | POA: Diagnosis not present

## 2022-04-15 DIAGNOSIS — N390 Urinary tract infection, site not specified: Secondary | ICD-10-CM | POA: Diagnosis not present

## 2022-04-15 DIAGNOSIS — E785 Hyperlipidemia, unspecified: Secondary | ICD-10-CM | POA: Diagnosis not present

## 2022-04-16 DIAGNOSIS — F411 Generalized anxiety disorder: Secondary | ICD-10-CM | POA: Diagnosis not present

## 2022-04-16 DIAGNOSIS — F331 Major depressive disorder, recurrent, moderate: Secondary | ICD-10-CM | POA: Diagnosis not present

## 2022-05-06 DIAGNOSIS — R293 Abnormal posture: Secondary | ICD-10-CM | POA: Diagnosis not present

## 2022-05-06 DIAGNOSIS — N139 Obstructive and reflux uropathy, unspecified: Secondary | ICD-10-CM | POA: Diagnosis not present

## 2022-05-06 DIAGNOSIS — I10 Essential (primary) hypertension: Secondary | ICD-10-CM | POA: Diagnosis not present

## 2022-05-06 DIAGNOSIS — M6259 Muscle wasting and atrophy, not elsewhere classified, multiple sites: Secondary | ICD-10-CM | POA: Diagnosis not present

## 2022-05-06 DIAGNOSIS — I2729 Other secondary pulmonary hypertension: Secondary | ICD-10-CM | POA: Diagnosis not present

## 2022-05-06 DIAGNOSIS — F909 Attention-deficit hyperactivity disorder, unspecified type: Secondary | ICD-10-CM | POA: Diagnosis not present

## 2022-05-06 DIAGNOSIS — Z4781 Encounter for orthopedic aftercare following surgical amputation: Secondary | ICD-10-CM | POA: Diagnosis not present

## 2022-05-06 DIAGNOSIS — G35 Multiple sclerosis: Secondary | ICD-10-CM | POA: Diagnosis not present

## 2022-05-06 DIAGNOSIS — I739 Peripheral vascular disease, unspecified: Secondary | ICD-10-CM | POA: Diagnosis not present

## 2022-05-08 DIAGNOSIS — F909 Attention-deficit hyperactivity disorder, unspecified type: Secondary | ICD-10-CM | POA: Diagnosis not present

## 2022-05-08 DIAGNOSIS — R293 Abnormal posture: Secondary | ICD-10-CM | POA: Diagnosis not present

## 2022-05-08 DIAGNOSIS — N139 Obstructive and reflux uropathy, unspecified: Secondary | ICD-10-CM | POA: Diagnosis not present

## 2022-05-08 DIAGNOSIS — M6259 Muscle wasting and atrophy, not elsewhere classified, multiple sites: Secondary | ICD-10-CM | POA: Diagnosis not present

## 2022-05-08 DIAGNOSIS — I10 Essential (primary) hypertension: Secondary | ICD-10-CM | POA: Diagnosis not present

## 2022-05-08 DIAGNOSIS — G35 Multiple sclerosis: Secondary | ICD-10-CM | POA: Diagnosis not present

## 2022-05-08 DIAGNOSIS — I2729 Other secondary pulmonary hypertension: Secondary | ICD-10-CM | POA: Diagnosis not present

## 2022-05-08 DIAGNOSIS — Z4781 Encounter for orthopedic aftercare following surgical amputation: Secondary | ICD-10-CM | POA: Diagnosis not present

## 2022-05-08 DIAGNOSIS — I739 Peripheral vascular disease, unspecified: Secondary | ICD-10-CM | POA: Diagnosis not present

## 2022-05-09 DIAGNOSIS — I739 Peripheral vascular disease, unspecified: Secondary | ICD-10-CM | POA: Diagnosis not present

## 2022-05-09 DIAGNOSIS — Z4781 Encounter for orthopedic aftercare following surgical amputation: Secondary | ICD-10-CM | POA: Diagnosis not present

## 2022-05-09 DIAGNOSIS — R293 Abnormal posture: Secondary | ICD-10-CM | POA: Diagnosis not present

## 2022-05-09 DIAGNOSIS — M6259 Muscle wasting and atrophy, not elsewhere classified, multiple sites: Secondary | ICD-10-CM | POA: Diagnosis not present

## 2022-05-09 DIAGNOSIS — I2729 Other secondary pulmonary hypertension: Secondary | ICD-10-CM | POA: Diagnosis not present

## 2022-05-09 DIAGNOSIS — N139 Obstructive and reflux uropathy, unspecified: Secondary | ICD-10-CM | POA: Diagnosis not present

## 2022-05-09 DIAGNOSIS — F909 Attention-deficit hyperactivity disorder, unspecified type: Secondary | ICD-10-CM | POA: Diagnosis not present

## 2022-05-09 DIAGNOSIS — G35 Multiple sclerosis: Secondary | ICD-10-CM | POA: Diagnosis not present

## 2022-05-09 DIAGNOSIS — I10 Essential (primary) hypertension: Secondary | ICD-10-CM | POA: Diagnosis not present

## 2022-05-10 DIAGNOSIS — I739 Peripheral vascular disease, unspecified: Secondary | ICD-10-CM | POA: Diagnosis not present

## 2022-05-10 DIAGNOSIS — H50112 Monocular exotropia, left eye: Secondary | ICD-10-CM | POA: Diagnosis not present

## 2022-05-10 DIAGNOSIS — R293 Abnormal posture: Secondary | ICD-10-CM | POA: Diagnosis not present

## 2022-05-10 DIAGNOSIS — G35 Multiple sclerosis: Secondary | ICD-10-CM | POA: Diagnosis not present

## 2022-05-10 DIAGNOSIS — H2513 Age-related nuclear cataract, bilateral: Secondary | ICD-10-CM | POA: Diagnosis not present

## 2022-05-10 DIAGNOSIS — F909 Attention-deficit hyperactivity disorder, unspecified type: Secondary | ICD-10-CM | POA: Diagnosis not present

## 2022-05-10 DIAGNOSIS — I2729 Other secondary pulmonary hypertension: Secondary | ICD-10-CM | POA: Diagnosis not present

## 2022-05-10 DIAGNOSIS — H524 Presbyopia: Secondary | ICD-10-CM | POA: Diagnosis not present

## 2022-05-10 DIAGNOSIS — M6259 Muscle wasting and atrophy, not elsewhere classified, multiple sites: Secondary | ICD-10-CM | POA: Diagnosis not present

## 2022-05-10 DIAGNOSIS — Z4781 Encounter for orthopedic aftercare following surgical amputation: Secondary | ICD-10-CM | POA: Diagnosis not present

## 2022-05-10 DIAGNOSIS — I10 Essential (primary) hypertension: Secondary | ICD-10-CM | POA: Diagnosis not present

## 2022-05-10 DIAGNOSIS — N139 Obstructive and reflux uropathy, unspecified: Secondary | ICD-10-CM | POA: Diagnosis not present

## 2022-05-10 DIAGNOSIS — H04123 Dry eye syndrome of bilateral lacrimal glands: Secondary | ICD-10-CM | POA: Diagnosis not present

## 2022-05-13 DIAGNOSIS — R293 Abnormal posture: Secondary | ICD-10-CM | POA: Diagnosis not present

## 2022-05-13 DIAGNOSIS — F909 Attention-deficit hyperactivity disorder, unspecified type: Secondary | ICD-10-CM | POA: Diagnosis not present

## 2022-05-13 DIAGNOSIS — N139 Obstructive and reflux uropathy, unspecified: Secondary | ICD-10-CM | POA: Diagnosis not present

## 2022-05-13 DIAGNOSIS — I2729 Other secondary pulmonary hypertension: Secondary | ICD-10-CM | POA: Diagnosis not present

## 2022-05-13 DIAGNOSIS — I10 Essential (primary) hypertension: Secondary | ICD-10-CM | POA: Diagnosis not present

## 2022-05-13 DIAGNOSIS — I739 Peripheral vascular disease, unspecified: Secondary | ICD-10-CM | POA: Diagnosis not present

## 2022-05-13 DIAGNOSIS — M6259 Muscle wasting and atrophy, not elsewhere classified, multiple sites: Secondary | ICD-10-CM | POA: Diagnosis not present

## 2022-05-13 DIAGNOSIS — Z4781 Encounter for orthopedic aftercare following surgical amputation: Secondary | ICD-10-CM | POA: Diagnosis not present

## 2022-05-13 DIAGNOSIS — G35 Multiple sclerosis: Secondary | ICD-10-CM | POA: Diagnosis not present

## 2022-05-14 DIAGNOSIS — I2729 Other secondary pulmonary hypertension: Secondary | ICD-10-CM | POA: Diagnosis not present

## 2022-05-14 DIAGNOSIS — I10 Essential (primary) hypertension: Secondary | ICD-10-CM | POA: Diagnosis not present

## 2022-05-14 DIAGNOSIS — Z4781 Encounter for orthopedic aftercare following surgical amputation: Secondary | ICD-10-CM | POA: Diagnosis not present

## 2022-05-14 DIAGNOSIS — I739 Peripheral vascular disease, unspecified: Secondary | ICD-10-CM | POA: Diagnosis not present

## 2022-05-14 DIAGNOSIS — G35 Multiple sclerosis: Secondary | ICD-10-CM | POA: Diagnosis not present

## 2022-05-14 DIAGNOSIS — N139 Obstructive and reflux uropathy, unspecified: Secondary | ICD-10-CM | POA: Diagnosis not present

## 2022-05-14 DIAGNOSIS — M6259 Muscle wasting and atrophy, not elsewhere classified, multiple sites: Secondary | ICD-10-CM | POA: Diagnosis not present

## 2022-05-14 DIAGNOSIS — F909 Attention-deficit hyperactivity disorder, unspecified type: Secondary | ICD-10-CM | POA: Diagnosis not present

## 2022-05-14 DIAGNOSIS — R293 Abnormal posture: Secondary | ICD-10-CM | POA: Diagnosis not present

## 2022-05-15 DIAGNOSIS — F331 Major depressive disorder, recurrent, moderate: Secondary | ICD-10-CM | POA: Diagnosis not present

## 2022-05-15 DIAGNOSIS — I739 Peripheral vascular disease, unspecified: Secondary | ICD-10-CM | POA: Diagnosis not present

## 2022-05-15 DIAGNOSIS — R293 Abnormal posture: Secondary | ICD-10-CM | POA: Diagnosis not present

## 2022-05-15 DIAGNOSIS — F909 Attention-deficit hyperactivity disorder, unspecified type: Secondary | ICD-10-CM | POA: Diagnosis not present

## 2022-05-15 DIAGNOSIS — Z4781 Encounter for orthopedic aftercare following surgical amputation: Secondary | ICD-10-CM | POA: Diagnosis not present

## 2022-05-15 DIAGNOSIS — N139 Obstructive and reflux uropathy, unspecified: Secondary | ICD-10-CM | POA: Diagnosis not present

## 2022-05-15 DIAGNOSIS — G35 Multiple sclerosis: Secondary | ICD-10-CM | POA: Diagnosis not present

## 2022-05-15 DIAGNOSIS — F411 Generalized anxiety disorder: Secondary | ICD-10-CM | POA: Diagnosis not present

## 2022-05-15 DIAGNOSIS — I2729 Other secondary pulmonary hypertension: Secondary | ICD-10-CM | POA: Diagnosis not present

## 2022-05-15 DIAGNOSIS — I10 Essential (primary) hypertension: Secondary | ICD-10-CM | POA: Diagnosis not present

## 2022-05-15 DIAGNOSIS — M6259 Muscle wasting and atrophy, not elsewhere classified, multiple sites: Secondary | ICD-10-CM | POA: Diagnosis not present

## 2022-05-16 DIAGNOSIS — N139 Obstructive and reflux uropathy, unspecified: Secondary | ICD-10-CM | POA: Diagnosis not present

## 2022-05-16 DIAGNOSIS — R293 Abnormal posture: Secondary | ICD-10-CM | POA: Diagnosis not present

## 2022-05-16 DIAGNOSIS — G35 Multiple sclerosis: Secondary | ICD-10-CM | POA: Diagnosis not present

## 2022-05-16 DIAGNOSIS — I739 Peripheral vascular disease, unspecified: Secondary | ICD-10-CM | POA: Diagnosis not present

## 2022-05-16 DIAGNOSIS — E559 Vitamin D deficiency, unspecified: Secondary | ICD-10-CM | POA: Diagnosis not present

## 2022-05-16 DIAGNOSIS — Z4781 Encounter for orthopedic aftercare following surgical amputation: Secondary | ICD-10-CM | POA: Diagnosis not present

## 2022-05-16 DIAGNOSIS — R21 Rash and other nonspecific skin eruption: Secondary | ICD-10-CM | POA: Diagnosis not present

## 2022-05-16 DIAGNOSIS — F909 Attention-deficit hyperactivity disorder, unspecified type: Secondary | ICD-10-CM | POA: Diagnosis not present

## 2022-05-16 DIAGNOSIS — E440 Moderate protein-calorie malnutrition: Secondary | ICD-10-CM | POA: Diagnosis not present

## 2022-05-16 DIAGNOSIS — I2729 Other secondary pulmonary hypertension: Secondary | ICD-10-CM | POA: Diagnosis not present

## 2022-05-16 DIAGNOSIS — I10 Essential (primary) hypertension: Secondary | ICD-10-CM | POA: Diagnosis not present

## 2022-05-16 DIAGNOSIS — Z98818 Other dental procedure status: Secondary | ICD-10-CM | POA: Diagnosis not present

## 2022-05-16 DIAGNOSIS — M6259 Muscle wasting and atrophy, not elsewhere classified, multiple sites: Secondary | ICD-10-CM | POA: Diagnosis not present

## 2022-05-17 DIAGNOSIS — D485 Neoplasm of uncertain behavior of skin: Secondary | ICD-10-CM | POA: Diagnosis not present

## 2022-05-17 DIAGNOSIS — C44311 Basal cell carcinoma of skin of nose: Secondary | ICD-10-CM | POA: Diagnosis not present

## 2022-05-20 DIAGNOSIS — Z89612 Acquired absence of left leg above knee: Secondary | ICD-10-CM | POA: Diagnosis not present

## 2022-05-20 DIAGNOSIS — I10 Essential (primary) hypertension: Secondary | ICD-10-CM | POA: Diagnosis not present

## 2022-05-20 DIAGNOSIS — G35 Multiple sclerosis: Secondary | ICD-10-CM | POA: Diagnosis not present

## 2022-05-20 DIAGNOSIS — N139 Obstructive and reflux uropathy, unspecified: Secondary | ICD-10-CM | POA: Diagnosis not present

## 2022-05-20 DIAGNOSIS — M21611 Bunion of right foot: Secondary | ICD-10-CM | POA: Diagnosis not present

## 2022-05-20 DIAGNOSIS — R293 Abnormal posture: Secondary | ICD-10-CM | POA: Diagnosis not present

## 2022-05-20 DIAGNOSIS — L603 Nail dystrophy: Secondary | ICD-10-CM | POA: Diagnosis not present

## 2022-05-20 DIAGNOSIS — F909 Attention-deficit hyperactivity disorder, unspecified type: Secondary | ICD-10-CM | POA: Diagnosis not present

## 2022-05-20 DIAGNOSIS — Z4781 Encounter for orthopedic aftercare following surgical amputation: Secondary | ICD-10-CM | POA: Diagnosis not present

## 2022-05-20 DIAGNOSIS — M6259 Muscle wasting and atrophy, not elsewhere classified, multiple sites: Secondary | ICD-10-CM | POA: Diagnosis not present

## 2022-05-20 DIAGNOSIS — I2729 Other secondary pulmonary hypertension: Secondary | ICD-10-CM | POA: Diagnosis not present

## 2022-05-20 DIAGNOSIS — I739 Peripheral vascular disease, unspecified: Secondary | ICD-10-CM | POA: Diagnosis not present

## 2022-05-21 DIAGNOSIS — M6259 Muscle wasting and atrophy, not elsewhere classified, multiple sites: Secondary | ICD-10-CM | POA: Diagnosis not present

## 2022-05-21 DIAGNOSIS — I739 Peripheral vascular disease, unspecified: Secondary | ICD-10-CM | POA: Diagnosis not present

## 2022-05-21 DIAGNOSIS — I10 Essential (primary) hypertension: Secondary | ICD-10-CM | POA: Diagnosis not present

## 2022-05-21 DIAGNOSIS — I2729 Other secondary pulmonary hypertension: Secondary | ICD-10-CM | POA: Diagnosis not present

## 2022-05-21 DIAGNOSIS — Z4781 Encounter for orthopedic aftercare following surgical amputation: Secondary | ICD-10-CM | POA: Diagnosis not present

## 2022-05-21 DIAGNOSIS — G35 Multiple sclerosis: Secondary | ICD-10-CM | POA: Diagnosis not present

## 2022-05-21 DIAGNOSIS — F909 Attention-deficit hyperactivity disorder, unspecified type: Secondary | ICD-10-CM | POA: Diagnosis not present

## 2022-05-21 DIAGNOSIS — R293 Abnormal posture: Secondary | ICD-10-CM | POA: Diagnosis not present

## 2022-05-21 DIAGNOSIS — N139 Obstructive and reflux uropathy, unspecified: Secondary | ICD-10-CM | POA: Diagnosis not present

## 2022-05-23 DIAGNOSIS — G35 Multiple sclerosis: Secondary | ICD-10-CM | POA: Diagnosis not present

## 2022-05-23 DIAGNOSIS — R293 Abnormal posture: Secondary | ICD-10-CM | POA: Diagnosis not present

## 2022-05-23 DIAGNOSIS — F909 Attention-deficit hyperactivity disorder, unspecified type: Secondary | ICD-10-CM | POA: Diagnosis not present

## 2022-05-23 DIAGNOSIS — I739 Peripheral vascular disease, unspecified: Secondary | ICD-10-CM | POA: Diagnosis not present

## 2022-05-23 DIAGNOSIS — Z4781 Encounter for orthopedic aftercare following surgical amputation: Secondary | ICD-10-CM | POA: Diagnosis not present

## 2022-05-23 DIAGNOSIS — M6259 Muscle wasting and atrophy, not elsewhere classified, multiple sites: Secondary | ICD-10-CM | POA: Diagnosis not present

## 2022-05-23 DIAGNOSIS — N139 Obstructive and reflux uropathy, unspecified: Secondary | ICD-10-CM | POA: Diagnosis not present

## 2022-05-23 DIAGNOSIS — I10 Essential (primary) hypertension: Secondary | ICD-10-CM | POA: Diagnosis not present

## 2022-05-23 DIAGNOSIS — I2729 Other secondary pulmonary hypertension: Secondary | ICD-10-CM | POA: Diagnosis not present

## 2022-05-24 DIAGNOSIS — R293 Abnormal posture: Secondary | ICD-10-CM | POA: Diagnosis not present

## 2022-05-24 DIAGNOSIS — I2729 Other secondary pulmonary hypertension: Secondary | ICD-10-CM | POA: Diagnosis not present

## 2022-05-24 DIAGNOSIS — N139 Obstructive and reflux uropathy, unspecified: Secondary | ICD-10-CM | POA: Diagnosis not present

## 2022-05-24 DIAGNOSIS — I739 Peripheral vascular disease, unspecified: Secondary | ICD-10-CM | POA: Diagnosis not present

## 2022-05-24 DIAGNOSIS — Z4781 Encounter for orthopedic aftercare following surgical amputation: Secondary | ICD-10-CM | POA: Diagnosis not present

## 2022-05-24 DIAGNOSIS — M6259 Muscle wasting and atrophy, not elsewhere classified, multiple sites: Secondary | ICD-10-CM | POA: Diagnosis not present

## 2022-05-24 DIAGNOSIS — G35 Multiple sclerosis: Secondary | ICD-10-CM | POA: Diagnosis not present

## 2022-05-24 DIAGNOSIS — F909 Attention-deficit hyperactivity disorder, unspecified type: Secondary | ICD-10-CM | POA: Diagnosis not present

## 2022-05-24 DIAGNOSIS — I10 Essential (primary) hypertension: Secondary | ICD-10-CM | POA: Diagnosis not present

## 2022-05-27 DIAGNOSIS — G35 Multiple sclerosis: Secondary | ICD-10-CM | POA: Diagnosis not present

## 2022-05-27 DIAGNOSIS — I739 Peripheral vascular disease, unspecified: Secondary | ICD-10-CM | POA: Diagnosis not present

## 2022-05-27 DIAGNOSIS — I2729 Other secondary pulmonary hypertension: Secondary | ICD-10-CM | POA: Diagnosis not present

## 2022-05-27 DIAGNOSIS — M6259 Muscle wasting and atrophy, not elsewhere classified, multiple sites: Secondary | ICD-10-CM | POA: Diagnosis not present

## 2022-05-27 DIAGNOSIS — F331 Major depressive disorder, recurrent, moderate: Secondary | ICD-10-CM | POA: Diagnosis not present

## 2022-05-27 DIAGNOSIS — F5102 Adjustment insomnia: Secondary | ICD-10-CM | POA: Diagnosis not present

## 2022-05-27 DIAGNOSIS — R293 Abnormal posture: Secondary | ICD-10-CM | POA: Diagnosis not present

## 2022-05-27 DIAGNOSIS — N139 Obstructive and reflux uropathy, unspecified: Secondary | ICD-10-CM | POA: Diagnosis not present

## 2022-05-27 DIAGNOSIS — F411 Generalized anxiety disorder: Secondary | ICD-10-CM | POA: Diagnosis not present

## 2022-05-27 DIAGNOSIS — I10 Essential (primary) hypertension: Secondary | ICD-10-CM | POA: Diagnosis not present

## 2022-05-27 DIAGNOSIS — Z4781 Encounter for orthopedic aftercare following surgical amputation: Secondary | ICD-10-CM | POA: Diagnosis not present

## 2022-05-27 DIAGNOSIS — F909 Attention-deficit hyperactivity disorder, unspecified type: Secondary | ICD-10-CM | POA: Diagnosis not present

## 2022-05-28 DIAGNOSIS — I739 Peripheral vascular disease, unspecified: Secondary | ICD-10-CM | POA: Diagnosis not present

## 2022-05-28 DIAGNOSIS — N139 Obstructive and reflux uropathy, unspecified: Secondary | ICD-10-CM | POA: Diagnosis not present

## 2022-05-28 DIAGNOSIS — R293 Abnormal posture: Secondary | ICD-10-CM | POA: Diagnosis not present

## 2022-05-28 DIAGNOSIS — I10 Essential (primary) hypertension: Secondary | ICD-10-CM | POA: Diagnosis not present

## 2022-05-28 DIAGNOSIS — F411 Generalized anxiety disorder: Secondary | ICD-10-CM | POA: Diagnosis not present

## 2022-05-28 DIAGNOSIS — M6259 Muscle wasting and atrophy, not elsewhere classified, multiple sites: Secondary | ICD-10-CM | POA: Diagnosis not present

## 2022-05-28 DIAGNOSIS — Z4781 Encounter for orthopedic aftercare following surgical amputation: Secondary | ICD-10-CM | POA: Diagnosis not present

## 2022-05-28 DIAGNOSIS — G35 Multiple sclerosis: Secondary | ICD-10-CM | POA: Diagnosis not present

## 2022-05-28 DIAGNOSIS — F331 Major depressive disorder, recurrent, moderate: Secondary | ICD-10-CM | POA: Diagnosis not present

## 2022-05-28 DIAGNOSIS — I2729 Other secondary pulmonary hypertension: Secondary | ICD-10-CM | POA: Diagnosis not present

## 2022-05-28 DIAGNOSIS — F909 Attention-deficit hyperactivity disorder, unspecified type: Secondary | ICD-10-CM | POA: Diagnosis not present

## 2022-05-29 DIAGNOSIS — R293 Abnormal posture: Secondary | ICD-10-CM | POA: Diagnosis not present

## 2022-05-29 DIAGNOSIS — I2729 Other secondary pulmonary hypertension: Secondary | ICD-10-CM | POA: Diagnosis not present

## 2022-05-29 DIAGNOSIS — I10 Essential (primary) hypertension: Secondary | ICD-10-CM | POA: Diagnosis not present

## 2022-05-29 DIAGNOSIS — F909 Attention-deficit hyperactivity disorder, unspecified type: Secondary | ICD-10-CM | POA: Diagnosis not present

## 2022-05-29 DIAGNOSIS — G35 Multiple sclerosis: Secondary | ICD-10-CM | POA: Diagnosis not present

## 2022-05-29 DIAGNOSIS — M6259 Muscle wasting and atrophy, not elsewhere classified, multiple sites: Secondary | ICD-10-CM | POA: Diagnosis not present

## 2022-05-29 DIAGNOSIS — N139 Obstructive and reflux uropathy, unspecified: Secondary | ICD-10-CM | POA: Diagnosis not present

## 2022-05-29 DIAGNOSIS — Z4781 Encounter for orthopedic aftercare following surgical amputation: Secondary | ICD-10-CM | POA: Diagnosis not present

## 2022-05-29 DIAGNOSIS — I739 Peripheral vascular disease, unspecified: Secondary | ICD-10-CM | POA: Diagnosis not present

## 2022-05-30 DIAGNOSIS — G35 Multiple sclerosis: Secondary | ICD-10-CM | POA: Diagnosis not present

## 2022-05-30 DIAGNOSIS — F4325 Adjustment disorder with mixed disturbance of emotions and conduct: Secondary | ICD-10-CM | POA: Diagnosis not present

## 2022-05-30 DIAGNOSIS — Z4781 Encounter for orthopedic aftercare following surgical amputation: Secondary | ICD-10-CM | POA: Diagnosis not present

## 2022-05-30 DIAGNOSIS — I739 Peripheral vascular disease, unspecified: Secondary | ICD-10-CM | POA: Diagnosis not present

## 2022-05-30 DIAGNOSIS — I10 Essential (primary) hypertension: Secondary | ICD-10-CM | POA: Diagnosis not present

## 2022-05-30 DIAGNOSIS — R293 Abnormal posture: Secondary | ICD-10-CM | POA: Diagnosis not present

## 2022-05-30 DIAGNOSIS — F909 Attention-deficit hyperactivity disorder, unspecified type: Secondary | ICD-10-CM | POA: Diagnosis not present

## 2022-05-30 DIAGNOSIS — M6259 Muscle wasting and atrophy, not elsewhere classified, multiple sites: Secondary | ICD-10-CM | POA: Diagnosis not present

## 2022-05-30 DIAGNOSIS — N139 Obstructive and reflux uropathy, unspecified: Secondary | ICD-10-CM | POA: Diagnosis not present

## 2022-05-31 DIAGNOSIS — G35 Multiple sclerosis: Secondary | ICD-10-CM | POA: Diagnosis not present

## 2022-05-31 DIAGNOSIS — F4325 Adjustment disorder with mixed disturbance of emotions and conduct: Secondary | ICD-10-CM | POA: Diagnosis not present

## 2022-05-31 DIAGNOSIS — R293 Abnormal posture: Secondary | ICD-10-CM | POA: Diagnosis not present

## 2022-05-31 DIAGNOSIS — F909 Attention-deficit hyperactivity disorder, unspecified type: Secondary | ICD-10-CM | POA: Diagnosis not present

## 2022-05-31 DIAGNOSIS — M6259 Muscle wasting and atrophy, not elsewhere classified, multiple sites: Secondary | ICD-10-CM | POA: Diagnosis not present

## 2022-05-31 DIAGNOSIS — Z4781 Encounter for orthopedic aftercare following surgical amputation: Secondary | ICD-10-CM | POA: Diagnosis not present

## 2022-05-31 DIAGNOSIS — I10 Essential (primary) hypertension: Secondary | ICD-10-CM | POA: Diagnosis not present

## 2022-05-31 DIAGNOSIS — I739 Peripheral vascular disease, unspecified: Secondary | ICD-10-CM | POA: Diagnosis not present

## 2022-05-31 DIAGNOSIS — N139 Obstructive and reflux uropathy, unspecified: Secondary | ICD-10-CM | POA: Diagnosis not present

## 2022-06-03 DIAGNOSIS — R293 Abnormal posture: Secondary | ICD-10-CM | POA: Diagnosis not present

## 2022-06-03 DIAGNOSIS — M6259 Muscle wasting and atrophy, not elsewhere classified, multiple sites: Secondary | ICD-10-CM | POA: Diagnosis not present

## 2022-06-03 DIAGNOSIS — I10 Essential (primary) hypertension: Secondary | ICD-10-CM | POA: Diagnosis not present

## 2022-06-03 DIAGNOSIS — I739 Peripheral vascular disease, unspecified: Secondary | ICD-10-CM | POA: Diagnosis not present

## 2022-06-03 DIAGNOSIS — N139 Obstructive and reflux uropathy, unspecified: Secondary | ICD-10-CM | POA: Diagnosis not present

## 2022-06-03 DIAGNOSIS — F909 Attention-deficit hyperactivity disorder, unspecified type: Secondary | ICD-10-CM | POA: Diagnosis not present

## 2022-06-03 DIAGNOSIS — Z4781 Encounter for orthopedic aftercare following surgical amputation: Secondary | ICD-10-CM | POA: Diagnosis not present

## 2022-06-03 DIAGNOSIS — F4325 Adjustment disorder with mixed disturbance of emotions and conduct: Secondary | ICD-10-CM | POA: Diagnosis not present

## 2022-06-03 DIAGNOSIS — G35 Multiple sclerosis: Secondary | ICD-10-CM | POA: Diagnosis not present

## 2022-06-04 DIAGNOSIS — F909 Attention-deficit hyperactivity disorder, unspecified type: Secondary | ICD-10-CM | POA: Diagnosis not present

## 2022-06-04 DIAGNOSIS — Z4781 Encounter for orthopedic aftercare following surgical amputation: Secondary | ICD-10-CM | POA: Diagnosis not present

## 2022-06-04 DIAGNOSIS — I10 Essential (primary) hypertension: Secondary | ICD-10-CM | POA: Diagnosis not present

## 2022-06-04 DIAGNOSIS — G35 Multiple sclerosis: Secondary | ICD-10-CM | POA: Diagnosis not present

## 2022-06-04 DIAGNOSIS — F4325 Adjustment disorder with mixed disturbance of emotions and conduct: Secondary | ICD-10-CM | POA: Diagnosis not present

## 2022-06-04 DIAGNOSIS — R293 Abnormal posture: Secondary | ICD-10-CM | POA: Diagnosis not present

## 2022-06-04 DIAGNOSIS — N139 Obstructive and reflux uropathy, unspecified: Secondary | ICD-10-CM | POA: Diagnosis not present

## 2022-06-04 DIAGNOSIS — I739 Peripheral vascular disease, unspecified: Secondary | ICD-10-CM | POA: Diagnosis not present

## 2022-06-04 DIAGNOSIS — M6259 Muscle wasting and atrophy, not elsewhere classified, multiple sites: Secondary | ICD-10-CM | POA: Diagnosis not present

## 2022-06-07 DIAGNOSIS — R293 Abnormal posture: Secondary | ICD-10-CM | POA: Diagnosis not present

## 2022-06-07 DIAGNOSIS — I739 Peripheral vascular disease, unspecified: Secondary | ICD-10-CM | POA: Diagnosis not present

## 2022-06-07 DIAGNOSIS — F909 Attention-deficit hyperactivity disorder, unspecified type: Secondary | ICD-10-CM | POA: Diagnosis not present

## 2022-06-07 DIAGNOSIS — M6259 Muscle wasting and atrophy, not elsewhere classified, multiple sites: Secondary | ICD-10-CM | POA: Diagnosis not present

## 2022-06-07 DIAGNOSIS — F4325 Adjustment disorder with mixed disturbance of emotions and conduct: Secondary | ICD-10-CM | POA: Diagnosis not present

## 2022-06-07 DIAGNOSIS — N139 Obstructive and reflux uropathy, unspecified: Secondary | ICD-10-CM | POA: Diagnosis not present

## 2022-06-07 DIAGNOSIS — I10 Essential (primary) hypertension: Secondary | ICD-10-CM | POA: Diagnosis not present

## 2022-06-07 DIAGNOSIS — Z4781 Encounter for orthopedic aftercare following surgical amputation: Secondary | ICD-10-CM | POA: Diagnosis not present

## 2022-06-07 DIAGNOSIS — G35 Multiple sclerosis: Secondary | ICD-10-CM | POA: Diagnosis not present

## 2022-06-10 DIAGNOSIS — F909 Attention-deficit hyperactivity disorder, unspecified type: Secondary | ICD-10-CM | POA: Diagnosis not present

## 2022-06-10 DIAGNOSIS — I739 Peripheral vascular disease, unspecified: Secondary | ICD-10-CM | POA: Diagnosis not present

## 2022-06-10 DIAGNOSIS — R293 Abnormal posture: Secondary | ICD-10-CM | POA: Diagnosis not present

## 2022-06-10 DIAGNOSIS — F4325 Adjustment disorder with mixed disturbance of emotions and conduct: Secondary | ICD-10-CM | POA: Diagnosis not present

## 2022-06-10 DIAGNOSIS — M6259 Muscle wasting and atrophy, not elsewhere classified, multiple sites: Secondary | ICD-10-CM | POA: Diagnosis not present

## 2022-06-10 DIAGNOSIS — N139 Obstructive and reflux uropathy, unspecified: Secondary | ICD-10-CM | POA: Diagnosis not present

## 2022-06-10 DIAGNOSIS — I10 Essential (primary) hypertension: Secondary | ICD-10-CM | POA: Diagnosis not present

## 2022-06-10 DIAGNOSIS — G35 Multiple sclerosis: Secondary | ICD-10-CM | POA: Diagnosis not present

## 2022-06-10 DIAGNOSIS — Z4781 Encounter for orthopedic aftercare following surgical amputation: Secondary | ICD-10-CM | POA: Diagnosis not present

## 2022-06-11 DIAGNOSIS — F331 Major depressive disorder, recurrent, moderate: Secondary | ICD-10-CM | POA: Diagnosis not present

## 2022-06-11 DIAGNOSIS — F411 Generalized anxiety disorder: Secondary | ICD-10-CM | POA: Diagnosis not present

## 2022-06-12 DIAGNOSIS — I739 Peripheral vascular disease, unspecified: Secondary | ICD-10-CM | POA: Diagnosis not present

## 2022-06-12 DIAGNOSIS — F4325 Adjustment disorder with mixed disturbance of emotions and conduct: Secondary | ICD-10-CM | POA: Diagnosis not present

## 2022-06-12 DIAGNOSIS — I10 Essential (primary) hypertension: Secondary | ICD-10-CM | POA: Diagnosis not present

## 2022-06-12 DIAGNOSIS — R293 Abnormal posture: Secondary | ICD-10-CM | POA: Diagnosis not present

## 2022-06-12 DIAGNOSIS — Z4781 Encounter for orthopedic aftercare following surgical amputation: Secondary | ICD-10-CM | POA: Diagnosis not present

## 2022-06-12 DIAGNOSIS — G35 Multiple sclerosis: Secondary | ICD-10-CM | POA: Diagnosis not present

## 2022-06-12 DIAGNOSIS — N139 Obstructive and reflux uropathy, unspecified: Secondary | ICD-10-CM | POA: Diagnosis not present

## 2022-06-12 DIAGNOSIS — M6259 Muscle wasting and atrophy, not elsewhere classified, multiple sites: Secondary | ICD-10-CM | POA: Diagnosis not present

## 2022-06-12 DIAGNOSIS — F909 Attention-deficit hyperactivity disorder, unspecified type: Secondary | ICD-10-CM | POA: Diagnosis not present

## 2022-06-14 DIAGNOSIS — G35 Multiple sclerosis: Secondary | ICD-10-CM | POA: Diagnosis not present

## 2022-06-14 DIAGNOSIS — I739 Peripheral vascular disease, unspecified: Secondary | ICD-10-CM | POA: Diagnosis not present

## 2022-06-14 DIAGNOSIS — R293 Abnormal posture: Secondary | ICD-10-CM | POA: Diagnosis not present

## 2022-06-14 DIAGNOSIS — F4325 Adjustment disorder with mixed disturbance of emotions and conduct: Secondary | ICD-10-CM | POA: Diagnosis not present

## 2022-06-14 DIAGNOSIS — F909 Attention-deficit hyperactivity disorder, unspecified type: Secondary | ICD-10-CM | POA: Diagnosis not present

## 2022-06-14 DIAGNOSIS — N139 Obstructive and reflux uropathy, unspecified: Secondary | ICD-10-CM | POA: Diagnosis not present

## 2022-06-14 DIAGNOSIS — I10 Essential (primary) hypertension: Secondary | ICD-10-CM | POA: Diagnosis not present

## 2022-06-14 DIAGNOSIS — Z4781 Encounter for orthopedic aftercare following surgical amputation: Secondary | ICD-10-CM | POA: Diagnosis not present

## 2022-06-14 DIAGNOSIS — M6259 Muscle wasting and atrophy, not elsewhere classified, multiple sites: Secondary | ICD-10-CM | POA: Diagnosis not present

## 2022-06-17 DIAGNOSIS — G35 Multiple sclerosis: Secondary | ICD-10-CM | POA: Diagnosis not present

## 2022-06-17 DIAGNOSIS — I739 Peripheral vascular disease, unspecified: Secondary | ICD-10-CM | POA: Diagnosis not present

## 2022-06-17 DIAGNOSIS — Z4781 Encounter for orthopedic aftercare following surgical amputation: Secondary | ICD-10-CM | POA: Diagnosis not present

## 2022-06-17 DIAGNOSIS — F909 Attention-deficit hyperactivity disorder, unspecified type: Secondary | ICD-10-CM | POA: Diagnosis not present

## 2022-06-17 DIAGNOSIS — N139 Obstructive and reflux uropathy, unspecified: Secondary | ICD-10-CM | POA: Diagnosis not present

## 2022-06-17 DIAGNOSIS — I10 Essential (primary) hypertension: Secondary | ICD-10-CM | POA: Diagnosis not present

## 2022-06-17 DIAGNOSIS — M6259 Muscle wasting and atrophy, not elsewhere classified, multiple sites: Secondary | ICD-10-CM | POA: Diagnosis not present

## 2022-06-17 DIAGNOSIS — R293 Abnormal posture: Secondary | ICD-10-CM | POA: Diagnosis not present

## 2022-06-17 DIAGNOSIS — F4325 Adjustment disorder with mixed disturbance of emotions and conduct: Secondary | ICD-10-CM | POA: Diagnosis not present

## 2022-06-18 DIAGNOSIS — R8290 Unspecified abnormal findings in urine: Secondary | ICD-10-CM | POA: Diagnosis not present

## 2022-06-18 DIAGNOSIS — B379 Candidiasis, unspecified: Secondary | ICD-10-CM | POA: Diagnosis not present

## 2022-06-18 DIAGNOSIS — R21 Rash and other nonspecific skin eruption: Secondary | ICD-10-CM | POA: Diagnosis not present

## 2022-06-19 DIAGNOSIS — F331 Major depressive disorder, recurrent, moderate: Secondary | ICD-10-CM | POA: Diagnosis not present

## 2022-06-19 DIAGNOSIS — N390 Urinary tract infection, site not specified: Secondary | ICD-10-CM | POA: Diagnosis not present

## 2022-06-19 DIAGNOSIS — F5102 Adjustment insomnia: Secondary | ICD-10-CM | POA: Diagnosis not present

## 2022-06-19 DIAGNOSIS — F411 Generalized anxiety disorder: Secondary | ICD-10-CM | POA: Diagnosis not present

## 2022-06-20 DIAGNOSIS — F4325 Adjustment disorder with mixed disturbance of emotions and conduct: Secondary | ICD-10-CM | POA: Diagnosis not present

## 2022-06-20 DIAGNOSIS — F909 Attention-deficit hyperactivity disorder, unspecified type: Secondary | ICD-10-CM | POA: Diagnosis not present

## 2022-06-20 DIAGNOSIS — M6259 Muscle wasting and atrophy, not elsewhere classified, multiple sites: Secondary | ICD-10-CM | POA: Diagnosis not present

## 2022-06-20 DIAGNOSIS — R293 Abnormal posture: Secondary | ICD-10-CM | POA: Diagnosis not present

## 2022-06-20 DIAGNOSIS — I739 Peripheral vascular disease, unspecified: Secondary | ICD-10-CM | POA: Diagnosis not present

## 2022-06-20 DIAGNOSIS — I10 Essential (primary) hypertension: Secondary | ICD-10-CM | POA: Diagnosis not present

## 2022-06-20 DIAGNOSIS — G35 Multiple sclerosis: Secondary | ICD-10-CM | POA: Diagnosis not present

## 2022-06-20 DIAGNOSIS — Z4781 Encounter for orthopedic aftercare following surgical amputation: Secondary | ICD-10-CM | POA: Diagnosis not present

## 2022-06-20 DIAGNOSIS — N139 Obstructive and reflux uropathy, unspecified: Secondary | ICD-10-CM | POA: Diagnosis not present

## 2022-06-21 DIAGNOSIS — R293 Abnormal posture: Secondary | ICD-10-CM | POA: Diagnosis not present

## 2022-06-21 DIAGNOSIS — N139 Obstructive and reflux uropathy, unspecified: Secondary | ICD-10-CM | POA: Diagnosis not present

## 2022-06-21 DIAGNOSIS — F4325 Adjustment disorder with mixed disturbance of emotions and conduct: Secondary | ICD-10-CM | POA: Diagnosis not present

## 2022-06-21 DIAGNOSIS — G35 Multiple sclerosis: Secondary | ICD-10-CM | POA: Diagnosis not present

## 2022-06-21 DIAGNOSIS — M6259 Muscle wasting and atrophy, not elsewhere classified, multiple sites: Secondary | ICD-10-CM | POA: Diagnosis not present

## 2022-06-21 DIAGNOSIS — I10 Essential (primary) hypertension: Secondary | ICD-10-CM | POA: Diagnosis not present

## 2022-06-21 DIAGNOSIS — F909 Attention-deficit hyperactivity disorder, unspecified type: Secondary | ICD-10-CM | POA: Diagnosis not present

## 2022-06-21 DIAGNOSIS — Z4781 Encounter for orthopedic aftercare following surgical amputation: Secondary | ICD-10-CM | POA: Diagnosis not present

## 2022-06-21 DIAGNOSIS — I739 Peripheral vascular disease, unspecified: Secondary | ICD-10-CM | POA: Diagnosis not present

## 2022-06-24 DIAGNOSIS — F4325 Adjustment disorder with mixed disturbance of emotions and conduct: Secondary | ICD-10-CM | POA: Diagnosis not present

## 2022-06-24 DIAGNOSIS — G35 Multiple sclerosis: Secondary | ICD-10-CM | POA: Diagnosis not present

## 2022-06-24 DIAGNOSIS — M6259 Muscle wasting and atrophy, not elsewhere classified, multiple sites: Secondary | ICD-10-CM | POA: Diagnosis not present

## 2022-06-24 DIAGNOSIS — F909 Attention-deficit hyperactivity disorder, unspecified type: Secondary | ICD-10-CM | POA: Diagnosis not present

## 2022-06-24 DIAGNOSIS — I10 Essential (primary) hypertension: Secondary | ICD-10-CM | POA: Diagnosis not present

## 2022-06-24 DIAGNOSIS — Z4781 Encounter for orthopedic aftercare following surgical amputation: Secondary | ICD-10-CM | POA: Diagnosis not present

## 2022-06-24 DIAGNOSIS — R293 Abnormal posture: Secondary | ICD-10-CM | POA: Diagnosis not present

## 2022-06-24 DIAGNOSIS — I739 Peripheral vascular disease, unspecified: Secondary | ICD-10-CM | POA: Diagnosis not present

## 2022-06-24 DIAGNOSIS — N139 Obstructive and reflux uropathy, unspecified: Secondary | ICD-10-CM | POA: Diagnosis not present

## 2022-06-25 DIAGNOSIS — Z4781 Encounter for orthopedic aftercare following surgical amputation: Secondary | ICD-10-CM | POA: Diagnosis not present

## 2022-06-25 DIAGNOSIS — M6259 Muscle wasting and atrophy, not elsewhere classified, multiple sites: Secondary | ICD-10-CM | POA: Diagnosis not present

## 2022-06-25 DIAGNOSIS — F4325 Adjustment disorder with mixed disturbance of emotions and conduct: Secondary | ICD-10-CM | POA: Diagnosis not present

## 2022-06-25 DIAGNOSIS — R293 Abnormal posture: Secondary | ICD-10-CM | POA: Diagnosis not present

## 2022-06-25 DIAGNOSIS — I10 Essential (primary) hypertension: Secondary | ICD-10-CM | POA: Diagnosis not present

## 2022-06-25 DIAGNOSIS — I739 Peripheral vascular disease, unspecified: Secondary | ICD-10-CM | POA: Diagnosis not present

## 2022-06-25 DIAGNOSIS — N139 Obstructive and reflux uropathy, unspecified: Secondary | ICD-10-CM | POA: Diagnosis not present

## 2022-06-25 DIAGNOSIS — F331 Major depressive disorder, recurrent, moderate: Secondary | ICD-10-CM | POA: Diagnosis not present

## 2022-06-25 DIAGNOSIS — F411 Generalized anxiety disorder: Secondary | ICD-10-CM | POA: Diagnosis not present

## 2022-06-25 DIAGNOSIS — G35 Multiple sclerosis: Secondary | ICD-10-CM | POA: Diagnosis not present

## 2022-06-25 DIAGNOSIS — F909 Attention-deficit hyperactivity disorder, unspecified type: Secondary | ICD-10-CM | POA: Diagnosis not present

## 2022-06-26 DIAGNOSIS — F909 Attention-deficit hyperactivity disorder, unspecified type: Secondary | ICD-10-CM | POA: Diagnosis not present

## 2022-06-26 DIAGNOSIS — M6259 Muscle wasting and atrophy, not elsewhere classified, multiple sites: Secondary | ICD-10-CM | POA: Diagnosis not present

## 2022-06-26 DIAGNOSIS — F4325 Adjustment disorder with mixed disturbance of emotions and conduct: Secondary | ICD-10-CM | POA: Diagnosis not present

## 2022-06-26 DIAGNOSIS — G35 Multiple sclerosis: Secondary | ICD-10-CM | POA: Diagnosis not present

## 2022-06-26 DIAGNOSIS — Z4781 Encounter for orthopedic aftercare following surgical amputation: Secondary | ICD-10-CM | POA: Diagnosis not present

## 2022-06-26 DIAGNOSIS — N139 Obstructive and reflux uropathy, unspecified: Secondary | ICD-10-CM | POA: Diagnosis not present

## 2022-06-26 DIAGNOSIS — R293 Abnormal posture: Secondary | ICD-10-CM | POA: Diagnosis not present

## 2022-06-26 DIAGNOSIS — I739 Peripheral vascular disease, unspecified: Secondary | ICD-10-CM | POA: Diagnosis not present

## 2022-06-26 DIAGNOSIS — I10 Essential (primary) hypertension: Secondary | ICD-10-CM | POA: Diagnosis not present

## 2022-06-27 DIAGNOSIS — N390 Urinary tract infection, site not specified: Secondary | ICD-10-CM | POA: Diagnosis not present

## 2022-06-27 DIAGNOSIS — R4182 Altered mental status, unspecified: Secondary | ICD-10-CM | POA: Diagnosis not present

## 2022-06-27 DIAGNOSIS — R21 Rash and other nonspecific skin eruption: Secondary | ICD-10-CM | POA: Diagnosis not present

## 2022-06-28 DIAGNOSIS — R070 Pain in throat: Secondary | ICD-10-CM | POA: Diagnosis not present

## 2022-06-28 DIAGNOSIS — N390 Urinary tract infection, site not specified: Secondary | ICD-10-CM | POA: Diagnosis not present

## 2022-06-28 DIAGNOSIS — D72829 Elevated white blood cell count, unspecified: Secondary | ICD-10-CM | POA: Diagnosis not present

## 2022-06-29 DIAGNOSIS — R070 Pain in throat: Secondary | ICD-10-CM | POA: Diagnosis not present

## 2022-06-29 DIAGNOSIS — J09X2 Influenza due to identified novel influenza A virus with other respiratory manifestations: Secondary | ICD-10-CM | POA: Diagnosis not present

## 2022-07-01 DIAGNOSIS — I10 Essential (primary) hypertension: Secondary | ICD-10-CM | POA: Diagnosis not present

## 2022-07-02 DIAGNOSIS — D72829 Elevated white blood cell count, unspecified: Secondary | ICD-10-CM | POA: Diagnosis not present

## 2022-07-02 DIAGNOSIS — R070 Pain in throat: Secondary | ICD-10-CM | POA: Diagnosis not present

## 2022-07-02 DIAGNOSIS — N390 Urinary tract infection, site not specified: Secondary | ICD-10-CM | POA: Diagnosis not present

## 2022-07-05 DIAGNOSIS — R21 Rash and other nonspecific skin eruption: Secondary | ICD-10-CM | POA: Diagnosis not present

## 2022-07-08 DIAGNOSIS — E559 Vitamin D deficiency, unspecified: Secondary | ICD-10-CM | POA: Diagnosis not present

## 2022-07-08 DIAGNOSIS — F32A Depression, unspecified: Secondary | ICD-10-CM | POA: Diagnosis not present

## 2022-07-08 DIAGNOSIS — R21 Rash and other nonspecific skin eruption: Secondary | ICD-10-CM | POA: Diagnosis not present

## 2022-07-08 DIAGNOSIS — E569 Vitamin deficiency, unspecified: Secondary | ICD-10-CM | POA: Diagnosis not present

## 2022-07-08 DIAGNOSIS — J449 Chronic obstructive pulmonary disease, unspecified: Secondary | ICD-10-CM | POA: Diagnosis not present

## 2022-07-09 DIAGNOSIS — N390 Urinary tract infection, site not specified: Secondary | ICD-10-CM | POA: Diagnosis not present

## 2022-07-10 DIAGNOSIS — F331 Major depressive disorder, recurrent, moderate: Secondary | ICD-10-CM | POA: Diagnosis not present

## 2022-07-10 DIAGNOSIS — F411 Generalized anxiety disorder: Secondary | ICD-10-CM | POA: Diagnosis not present

## 2022-07-11 DIAGNOSIS — N390 Urinary tract infection, site not specified: Secondary | ICD-10-CM | POA: Diagnosis not present

## 2022-07-15 DIAGNOSIS — F411 Generalized anxiety disorder: Secondary | ICD-10-CM | POA: Diagnosis not present

## 2022-07-15 DIAGNOSIS — F331 Major depressive disorder, recurrent, moderate: Secondary | ICD-10-CM | POA: Diagnosis not present

## 2022-07-15 DIAGNOSIS — I10 Essential (primary) hypertension: Secondary | ICD-10-CM | POA: Diagnosis not present

## 2022-07-15 DIAGNOSIS — F5105 Insomnia due to other mental disorder: Secondary | ICD-10-CM | POA: Diagnosis not present

## 2022-07-15 DIAGNOSIS — N1831 Chronic kidney disease, stage 3a: Secondary | ICD-10-CM | POA: Diagnosis not present

## 2022-07-17 DIAGNOSIS — D631 Anemia in chronic kidney disease: Secondary | ICD-10-CM | POA: Diagnosis not present

## 2022-07-17 DIAGNOSIS — R600 Localized edema: Secondary | ICD-10-CM | POA: Diagnosis not present

## 2022-07-17 DIAGNOSIS — N390 Urinary tract infection, site not specified: Secondary | ICD-10-CM | POA: Diagnosis not present

## 2022-07-17 DIAGNOSIS — D72829 Elevated white blood cell count, unspecified: Secondary | ICD-10-CM | POA: Diagnosis not present

## 2022-07-24 DIAGNOSIS — F411 Generalized anxiety disorder: Secondary | ICD-10-CM | POA: Diagnosis not present

## 2022-07-24 DIAGNOSIS — F331 Major depressive disorder, recurrent, moderate: Secondary | ICD-10-CM | POA: Diagnosis not present

## 2022-07-31 DIAGNOSIS — L2089 Other atopic dermatitis: Secondary | ICD-10-CM | POA: Diagnosis not present

## 2022-08-07 DIAGNOSIS — F331 Major depressive disorder, recurrent, moderate: Secondary | ICD-10-CM | POA: Diagnosis not present

## 2022-08-07 DIAGNOSIS — F411 Generalized anxiety disorder: Secondary | ICD-10-CM | POA: Diagnosis not present

## 2022-08-14 DIAGNOSIS — L281 Prurigo nodularis: Secondary | ICD-10-CM | POA: Diagnosis not present

## 2022-08-14 DIAGNOSIS — B372 Candidiasis of skin and nail: Secondary | ICD-10-CM | POA: Diagnosis not present

## 2022-08-16 DIAGNOSIS — B379 Candidiasis, unspecified: Secondary | ICD-10-CM | POA: Diagnosis not present

## 2022-08-16 DIAGNOSIS — L281 Prurigo nodularis: Secondary | ICD-10-CM | POA: Diagnosis not present

## 2022-08-22 DIAGNOSIS — F411 Generalized anxiety disorder: Secondary | ICD-10-CM | POA: Diagnosis not present

## 2022-08-22 DIAGNOSIS — F331 Major depressive disorder, recurrent, moderate: Secondary | ICD-10-CM | POA: Diagnosis not present

## 2022-09-05 DIAGNOSIS — L281 Prurigo nodularis: Secondary | ICD-10-CM | POA: Diagnosis not present

## 2022-09-05 DIAGNOSIS — I10 Essential (primary) hypertension: Secondary | ICD-10-CM | POA: Diagnosis not present

## 2022-09-05 DIAGNOSIS — E871 Hypo-osmolality and hyponatremia: Secondary | ICD-10-CM | POA: Diagnosis not present

## 2022-09-05 DIAGNOSIS — D649 Anemia, unspecified: Secondary | ICD-10-CM | POA: Diagnosis not present

## 2022-09-18 DIAGNOSIS — E119 Type 2 diabetes mellitus without complications: Secondary | ICD-10-CM | POA: Diagnosis not present

## 2022-09-24 DIAGNOSIS — Z89512 Acquired absence of left leg below knee: Secondary | ICD-10-CM | POA: Diagnosis not present

## 2022-09-24 DIAGNOSIS — L603 Nail dystrophy: Secondary | ICD-10-CM | POA: Diagnosis not present

## 2022-09-24 DIAGNOSIS — I739 Peripheral vascular disease, unspecified: Secondary | ICD-10-CM | POA: Diagnosis not present

## 2022-10-02 ENCOUNTER — Other Ambulatory Visit (INDEPENDENT_AMBULATORY_CARE_PROVIDER_SITE_OTHER): Payer: Self-pay | Admitting: Nurse Practitioner

## 2022-10-02 DIAGNOSIS — Z89512 Acquired absence of left leg below knee: Secondary | ICD-10-CM

## 2022-10-07 DIAGNOSIS — K5900 Constipation, unspecified: Secondary | ICD-10-CM | POA: Diagnosis not present

## 2022-10-07 DIAGNOSIS — R41841 Cognitive communication deficit: Secondary | ICD-10-CM | POA: Diagnosis not present

## 2022-10-07 DIAGNOSIS — G4733 Obstructive sleep apnea (adult) (pediatric): Secondary | ICD-10-CM | POA: Diagnosis not present

## 2022-10-07 DIAGNOSIS — G629 Polyneuropathy, unspecified: Secondary | ICD-10-CM | POA: Diagnosis not present

## 2022-10-07 DIAGNOSIS — M6259 Muscle wasting and atrophy, not elsewhere classified, multiple sites: Secondary | ICD-10-CM | POA: Diagnosis not present

## 2022-10-07 DIAGNOSIS — G35 Multiple sclerosis: Secondary | ICD-10-CM | POA: Diagnosis not present

## 2022-10-07 DIAGNOSIS — N312 Flaccid neuropathic bladder, not elsewhere classified: Secondary | ICD-10-CM | POA: Diagnosis not present

## 2022-10-07 DIAGNOSIS — R293 Abnormal posture: Secondary | ICD-10-CM | POA: Diagnosis not present

## 2022-10-08 DIAGNOSIS — M6259 Muscle wasting and atrophy, not elsewhere classified, multiple sites: Secondary | ICD-10-CM | POA: Diagnosis not present

## 2022-10-08 DIAGNOSIS — G35 Multiple sclerosis: Secondary | ICD-10-CM | POA: Diagnosis not present

## 2022-10-08 DIAGNOSIS — G629 Polyneuropathy, unspecified: Secondary | ICD-10-CM | POA: Diagnosis not present

## 2022-10-08 DIAGNOSIS — N312 Flaccid neuropathic bladder, not elsewhere classified: Secondary | ICD-10-CM | POA: Diagnosis not present

## 2022-10-08 DIAGNOSIS — R293 Abnormal posture: Secondary | ICD-10-CM | POA: Diagnosis not present

## 2022-10-08 DIAGNOSIS — R41841 Cognitive communication deficit: Secondary | ICD-10-CM | POA: Diagnosis not present

## 2022-10-08 DIAGNOSIS — K5900 Constipation, unspecified: Secondary | ICD-10-CM | POA: Diagnosis not present

## 2022-10-08 DIAGNOSIS — G4733 Obstructive sleep apnea (adult) (pediatric): Secondary | ICD-10-CM | POA: Diagnosis not present

## 2022-10-09 DIAGNOSIS — L281 Prurigo nodularis: Secondary | ICD-10-CM | POA: Diagnosis not present

## 2022-10-09 DIAGNOSIS — R634 Abnormal weight loss: Secondary | ICD-10-CM | POA: Diagnosis not present

## 2022-10-09 DIAGNOSIS — M6259 Muscle wasting and atrophy, not elsewhere classified, multiple sites: Secondary | ICD-10-CM | POA: Diagnosis not present

## 2022-10-09 DIAGNOSIS — G35 Multiple sclerosis: Secondary | ICD-10-CM | POA: Diagnosis not present

## 2022-10-09 DIAGNOSIS — K5900 Constipation, unspecified: Secondary | ICD-10-CM | POA: Diagnosis not present

## 2022-10-09 DIAGNOSIS — R600 Localized edema: Secondary | ICD-10-CM | POA: Diagnosis not present

## 2022-10-09 DIAGNOSIS — G4733 Obstructive sleep apnea (adult) (pediatric): Secondary | ICD-10-CM | POA: Diagnosis not present

## 2022-10-09 DIAGNOSIS — R293 Abnormal posture: Secondary | ICD-10-CM | POA: Diagnosis not present

## 2022-10-09 DIAGNOSIS — G629 Polyneuropathy, unspecified: Secondary | ICD-10-CM | POA: Diagnosis not present

## 2022-10-09 DIAGNOSIS — N312 Flaccid neuropathic bladder, not elsewhere classified: Secondary | ICD-10-CM | POA: Diagnosis not present

## 2022-10-09 DIAGNOSIS — R41841 Cognitive communication deficit: Secondary | ICD-10-CM | POA: Diagnosis not present

## 2022-10-10 ENCOUNTER — Ambulatory Visit (INDEPENDENT_AMBULATORY_CARE_PROVIDER_SITE_OTHER): Payer: Medicare HMO | Admitting: Vascular Surgery

## 2022-10-10 ENCOUNTER — Encounter (INDEPENDENT_AMBULATORY_CARE_PROVIDER_SITE_OTHER): Payer: Self-pay | Admitting: Vascular Surgery

## 2022-10-10 ENCOUNTER — Ambulatory Visit (INDEPENDENT_AMBULATORY_CARE_PROVIDER_SITE_OTHER): Payer: Medicare HMO

## 2022-10-10 VITALS — BP 110/72 | HR 47 | Resp 18 | Ht 67.0 in | Wt 288.0 lb

## 2022-10-10 DIAGNOSIS — I10 Essential (primary) hypertension: Secondary | ICD-10-CM

## 2022-10-10 DIAGNOSIS — N1831 Chronic kidney disease, stage 3a: Secondary | ICD-10-CM

## 2022-10-10 DIAGNOSIS — R41841 Cognitive communication deficit: Secondary | ICD-10-CM | POA: Diagnosis not present

## 2022-10-10 DIAGNOSIS — J441 Chronic obstructive pulmonary disease with (acute) exacerbation: Secondary | ICD-10-CM | POA: Diagnosis not present

## 2022-10-10 DIAGNOSIS — Z89512 Acquired absence of left leg below knee: Secondary | ICD-10-CM

## 2022-10-10 DIAGNOSIS — G4733 Obstructive sleep apnea (adult) (pediatric): Secondary | ICD-10-CM | POA: Diagnosis not present

## 2022-10-10 DIAGNOSIS — I70213 Atherosclerosis of native arteries of extremities with intermittent claudication, bilateral legs: Secondary | ICD-10-CM

## 2022-10-10 DIAGNOSIS — E782 Mixed hyperlipidemia: Secondary | ICD-10-CM

## 2022-10-10 DIAGNOSIS — N312 Flaccid neuropathic bladder, not elsewhere classified: Secondary | ICD-10-CM | POA: Diagnosis not present

## 2022-10-10 DIAGNOSIS — M6259 Muscle wasting and atrophy, not elsewhere classified, multiple sites: Secondary | ICD-10-CM | POA: Diagnosis not present

## 2022-10-10 DIAGNOSIS — G35 Multiple sclerosis: Secondary | ICD-10-CM | POA: Diagnosis not present

## 2022-10-10 DIAGNOSIS — R293 Abnormal posture: Secondary | ICD-10-CM | POA: Diagnosis not present

## 2022-10-10 DIAGNOSIS — K5900 Constipation, unspecified: Secondary | ICD-10-CM | POA: Diagnosis not present

## 2022-10-10 DIAGNOSIS — G629 Polyneuropathy, unspecified: Secondary | ICD-10-CM | POA: Diagnosis not present

## 2022-10-11 DIAGNOSIS — E559 Vitamin D deficiency, unspecified: Secondary | ICD-10-CM | POA: Diagnosis not present

## 2022-10-11 DIAGNOSIS — M6259 Muscle wasting and atrophy, not elsewhere classified, multiple sites: Secondary | ICD-10-CM | POA: Diagnosis not present

## 2022-10-11 DIAGNOSIS — G35 Multiple sclerosis: Secondary | ICD-10-CM | POA: Diagnosis not present

## 2022-10-11 DIAGNOSIS — G629 Polyneuropathy, unspecified: Secondary | ICD-10-CM | POA: Diagnosis not present

## 2022-10-11 DIAGNOSIS — I10 Essential (primary) hypertension: Secondary | ICD-10-CM | POA: Diagnosis not present

## 2022-10-11 DIAGNOSIS — N312 Flaccid neuropathic bladder, not elsewhere classified: Secondary | ICD-10-CM | POA: Diagnosis not present

## 2022-10-11 DIAGNOSIS — G4733 Obstructive sleep apnea (adult) (pediatric): Secondary | ICD-10-CM | POA: Diagnosis not present

## 2022-10-11 DIAGNOSIS — R41841 Cognitive communication deficit: Secondary | ICD-10-CM | POA: Diagnosis not present

## 2022-10-11 DIAGNOSIS — R293 Abnormal posture: Secondary | ICD-10-CM | POA: Diagnosis not present

## 2022-10-11 DIAGNOSIS — K5900 Constipation, unspecified: Secondary | ICD-10-CM | POA: Diagnosis not present

## 2022-10-11 LAB — VAS US ABI WITH/WO TBI: Right ABI: 1.33

## 2022-10-13 ENCOUNTER — Encounter (INDEPENDENT_AMBULATORY_CARE_PROVIDER_SITE_OTHER): Payer: Self-pay | Admitting: Vascular Surgery

## 2022-10-13 DIAGNOSIS — I70219 Atherosclerosis of native arteries of extremities with intermittent claudication, unspecified extremity: Secondary | ICD-10-CM | POA: Insufficient documentation

## 2022-10-13 NOTE — Progress Notes (Signed)
MRN : [id number]  [name] is a 72 y.o. ([birth year]) female who presents with chief complaint of check circulation.  History of Present Illness:   The patient returns to the office for followup and review of the noninvasive studies.   There have been no interval changes in lower extremity symptoms. No interval shortening of the patient's claudication distance or development of rest pain symptoms. No new ulcers or wounds have occurred since the last visit.  There have been no significant changes to the patient's overall health care.  The patient denies amaurosis fugax or recent TIA symptoms. There are no documented recent neurological changes noted. There is no history of DVT, PE or superficial thrombophlebitis. The patient denies recent episodes of angina or shortness of breath.   ABI Rt=1.33 and Lt=BKA     Current Meds  Medication Sig   acetaminophen (TYLENOL) 500 MG tablet Take 1,000 mg by mouth every 8 (eight) hours as needed for moderate pain.   Amino Acids-Protein Hydrolys (FEEDING SUPPLEMENT, PRO-STAT SUGAR FREE 64,) LIQD Take 30 mLs by mouth 3 (three) times daily with meals.   amLODipine (NORVASC) 5 MG tablet Take 5 mg by mouth daily.   budesonide-formoterol (SYMBICORT) 160-4.5 MCG/ACT inhaler Inhale 2 puffs into the lungs 2 (two) times daily.    buPROPion (WELLBUTRIN XL) 300 MG 24 hr tablet Take 300 mg by mouth daily.   carboxymethylcellulose (REFRESH PLUS) 0.5 % SOLN 1 drop 3 (three) times daily as needed.   clonazePAM (KLONOPIN) 0.5 MG tablet Take 1 tablet (0.5 mg total) by mouth 2 (two) times daily.   Cranberry 400 MG CAPS Take 400 mg by mouth.   docusate sodium (COLACE) 100 MG capsule Take 1 capsule (100 mg total) by mouth 2 (two) times daily as needed.   DULoxetine (CYMBALTA) 60 MG capsule Take 1 capsule (60 mg total) by mouth every morning.   gabapentin (NEURONTIN) 600 MG tablet Take 600 mg by  mouth 3 (three) times daily.   hydrOXYzine (ATARAX) 25 MG tablet Take 25 mg by mouth 3 (three) times daily as needed.   Multiple Vitamin (MULTIVITAMIN WITH MINERALS) TABS tablet Take 1 tablet by mouth daily.   MYRBETRIQ 50 MG TB24 tablet TAKE ONE TABLET BY MOUTH ONCE DAILY   naloxone (NARCAN) nasal spray 4 mg/0.1 mL Place 1 spray into the nose.   oxyCODONE (OXY IR/ROXICODONE) 5 MG immediate release tablet Take 1 tablet (5 mg total) by mouth every 8 (eight) hours as needed for moderate pain or severe pain.   QUEtiapine Fumarate (SEROQUEL XR) 150 MG 24 hr tablet Take 150 mg by mouth at bedtime.    torsemide (DEMADEX) 20 MG tablet Take 1 tablet (20 mg total) by mouth daily as needed (edema).   traZODone (DESYREL) 50 MG tablet Take 50 mg by mouth at bedtime.   triamcinolone (KENALOG) 0.025 % cream Apply 1 Application topically 2 (two) times daily.   vortioxetine HBr (TRINTELLIX) 5 MG TABS tablet Take 5 mg by mouth daily.    Past Medical History:  Diagnosis Date   Abdominal aortic atherosclerosis (HCC) 11/11/2016  ADHD    Anxiety    COPD (chronic obstructive pulmonary disease) (HCC)    Depression    major depressive   Dyspnea    doe   Edema    left leg   Follicular lymphoma (HCC)    B Cell   Follicular lymphoma grade II (HCC)    Hypertension    Hypotension    idiopathic   Kyphoscoliosis and scoliosis 11/26/2011   Morbid obesity (HCC) 01/05/2016   Multiple sclerosis (HCC)    Multiple sclerosis (HCC)    1980's   Neuromuscular disorder (HCC)    Obstructive and reflux uropathy    foley   Pain    atypical facial   Peripheral vascular disease of lower extremity with ulceration (HCC) 11/08/2015   Skin ulcer (HCC) 11/08/2015   Weakness    generalized. has MS    Past Surgical History:  Procedure Laterality Date   AMPUTATION Left 02/01/2022   Procedure: AMPUTATION BELOW KNEE;  Surgeon: Annice Needy, MD;  Location: [facility];  Service: Vascular;  Laterality: Left;   BACK SURGERY N/A 2002    BONE BIOPSY Left 12/24/2021   Procedure: BONE BIOPSY-HEEL BONE;  Surgeon: Vivi Barrack, DPM;  Location: [facility];  Service: Podiatry;  Laterality: Left;   CYST EXCISION     lower back   INGUINAL LYMPH NODE BIOPSY Left 07/04/2016   Procedure: INGUINAL LYMPH NODE BIOPSY;  Surgeon: Kieth Brightly, MD;  Location: [facility];  Service: General;  Laterality: Left;   ORIF FEMUR FRACTURE Left 02/04/2020   Procedure: OPEN REDUCTION INTERNAL FIXATION (ORIF) DISTAL FEMUR FRACTURE;  Surgeon: Roby Lofts, MD;  Location: [facility];  Service: Orthopedics;  Laterality: Left;   PORTACATH PLACEMENT N/A 07/22/2016   Procedure: INSERTION PORT-A-CATH;  Surgeon: Kieth Brightly, MD;  Location: [facility];  Service: General;  Laterality: N/A;   TONSILLECTOMY AND ADENOIDECTOMY     TUBAL LIGATION     WOUND DEBRIDEMENT Left 12/24/2021   Procedure: DEBRIDEMENT WOUND-HEEL ULCER;  Surgeon: Vivi Barrack, DPM;  Location: [facility];  Service: Podiatry;  Laterality: Left;    Social History Social History   Tobacco Use   Smoking status: Former    Packs/day: 1.00    Years: 20.00    Additional pack years: 0.00    Total pack years: 20.00    Types: Cigarettes    Start date: 04/30/1995    Quit date: 02/03/2016    Years since quitting: 6.6   Smokeless tobacco: Never  Vaping Use   Vaping Use: Some days  Substance Use Topics   Alcohol use: No    Alcohol/week: 0.0 standard drinks of alcohol   Drug use: Yes    Types: Marijuana    Comment: smokes THC occasionally per pt     Family History Family History  Problem Relation Age of Onset   COPD Mother    Diabetes Mother    Heart failure Mother    Alcohol abuse Father    Kidney disease Father    Kidney failure Father    Arthritis Sister    CAD Maternal Grandmother    Stroke Maternal Grandfather    Arthritis Sister    Mental illness Sister    Arthritis Brother     No Known Allergies   REVIEW OF SYSTEMS (Negative unless  checked)  Constitutional: [] Weight loss  [] Fever  [] Chills Cardiac: [] Chest pain   [] Chest pressure   [] Palpitations   [] Shortness of breath when laying flat   [] Shortness  of breath with exertion. Vascular:  [x] Pain in legs with walking   [] Pain in legs at rest  [] History of DVT   [] Phlebitis   [] Swelling in legs   [] Varicose veins   [] Non-healing ulcers Pulmonary:   [] Uses home oxygen   [] Productive cough   [] Hemoptysis   [] Wheeze  [] COPD   [] Asthma Neurologic:  [] Dizziness   [] Seizures   [] History of stroke   [] History of TIA  [] Aphasia   [] Vissual changes   [] Weakness or numbness in arm   [] Weakness or numbness in leg Musculoskeletal:   [] Joint swelling   [] Joint pain   [] Low back pain Hematologic:  [] Easy bruising  [] Easy bleeding   [] Hypercoagulable state   [] Anemic Gastrointestinal:  [] Diarrhea   [] Vomiting  [] Gastroesophageal reflux/heartburn   [] Difficulty swallowing. Genitourinary:  [] Chronic kidney disease   [] Difficult urination  [] Frequent urination   [] Blood in urine Skin:  [] Rashes   [] Ulcers  Psychological:  [] History of anxiety   []  History of major depression.  Physical Examination  Vitals:   10/10/22 1139  BP: 110/72  Pulse: (!) 47  Resp: 18  Weight: 288 lb (130.6 kg)  Height: 5\' 7"  (1.702 m)   Body mass index is 45.11 kg/m. Gen: WD/WN, NAD Head: [place]/AT, No temporalis wasting.  Ear/Nose/Throat: Hearing grossly intact, nares w/o erythema or drainage Eyes: PER, EOMI, sclera nonicteric.  Neck: Supple, no masses.  No bruit or JVD.  Pulmonary:  Good air movement, no audible wheezing, no use of accessory muscles.  Cardiac: RRR, normal S1, S2, no Murmurs. Vascular:  mild trophic changes, no open wounds Vessel Right Left  Radial Palpable Palpable  PT Not Palpable BKA  DP Not Palpable BKA  Gastrointestinal: soft, non-distended. No guarding/no peritoneal signs.  Musculoskeletal: M/S 5/5 throughout.  No visible deformity.  Neurologic: CN 2-12 intact. Pain and light touch  intact in extremities.  Symmetrical.  Speech is fluent. Motor exam as listed above. Psychiatric: Judgment intact, Mood & affect appropriate for pt's clinical situation. Dermatologic: No rashes or ulcers noted.  No changes consistent with cellulitis.   CBC Lab Results  Component Value Date   WBC 6.3 02/06/2022   HGB 7.5 (L) 02/06/2022   HCT 23.5 (L) 02/06/2022   MCV 87.7 02/06/2022   PLT 185 02/06/2022    BMET    Component Value Date/Time   NA 138 02/06/2022 0516   NA 125 (L) 08/01/2013 2312   K 3.7 02/06/2022 0516   K 3.5 08/01/2013 2312   CL 105 02/06/2022 0516   CL 90 (L) 08/01/2013 2312   CO2 27 02/06/2022 0516   CO2 29 08/01/2013 2312   GLUCOSE 87 02/06/2022 0516   GLUCOSE 98 08/01/2013 2312   BUN 21 02/06/2022 0516   BUN 15 08/01/2013 2312   CREATININE 1.18 (H) 02/06/2022 0516   CREATININE 1.14 (H) 02/02/2018 1613   CALCIUM 8.7 (L) 02/06/2022 0516   CALCIUM 8.9 08/01/2013 2312   GFRNONAA 49 (L) 02/06/2022 0516   GFRNONAA 50 (L) 02/02/2018 1613   GFRAA 56 (L) 02/01/2020 0313   GFRAA 58 (L) 02/02/2018 1613   CrCl cannot be calculated (Patient's most recent lab result is older than the maximum 21 days allowed.).  COAG Lab Results  Component Value Date   INR 1.3 (H) 01/23/2022   INR 1.2 01/17/2022   INR 1.0 01/26/2020    Radiology VAS [place] ABI WITH/WO TBI  Result Date: 10/11/2022  LOWER EXTREMITY DOPPLER STUDY Patient Name:  [name]  Date of  Exam:   10/10/2022 Medical Rec #: [id number]      Accession #:    [id number] Date of Birth: 07/22/1950       Patient Gender: F Patient Age:   53 years Exam Location:  [facility] Procedure:      VAS [place] ABI WITH/WO TBI Referring Phys: Vivia Birmingham BROWN --------------------------------------------------------------------------------  High Risk Factors: Hypertension, hyperlipidemia, past history of smoking. Other Factors: BKA left. Recent wound to right shin.  Limitations: Today's exam was limited due to patient in  wheelchair. Performing Technologist: Hardie Lora RVT  Examination Guidelines: A complete evaluation includes at minimum, Doppler waveform signals and systolic blood pressure reading at the level of bilateral brachial, anterior tibial, and posterior tibial arteries, when vessel segments are accessible. Bilateral testing is considered an integral part of a complete examination. Photoelectric Plethysmograph (PPG) waveforms and toe systolic pressure readings are included as required and additional duplex testing as needed. Limited examinations for reoccurring indications may be performed as noted.  ABI Findings: +---------+------------------+-----+---------+--------+ Right    Rt Pressure (mmHg)IndexWaveform Comment  +---------+------------------+-----+---------+--------+ Brachial 122                                      +---------+------------------+-----+---------+--------+ PTA      158               1.30 triphasic         +---------+------------------+-----+---------+--------+ DP       162               1.33 triphasic         +---------+------------------+-----+---------+--------+ Great Toe151               1.24                   +---------+------------------+-----+---------+--------+ +--------+------------------+-----+--------+-------+ Left    Lt Pressure (mmHg)IndexWaveformComment +--------+------------------+-----+--------+-------+ Brachial120                                    +--------+------------------+-----+--------+-------+ +-------+-----------+-----------+------------+------------+ ABI/TBIToday's ABIToday's TBIPrevious ABIPrevious TBI +-------+-----------+-----------+------------+------------+ Right  1.33       1.24                                +-------+-----------+-----------+------------+------------+ Arterial wall calcification precludes accurate ankle pressures and ABIs.  Summary: Right: Resting right ankle-brachial index indicates  noncompressible right lower extremity arteries. The right toe-brachial index is normal. *See table(s) above for measurements and observations.  Electronically signed by Levora Dredge MD on 10/11/2022 at 9:55:21 AM.    Final      Assessment/Plan 1. Atherosclerosis of native artery of both lower extremities with intermittent claudication (HCC)  Recommend:  The patient has evidence of atherosclerosis of the lower extremities with claudication.  The patient does not voice lifestyle limiting changes at this point in time.  Noninvasive studies do not suggest clinically significant change.  No invasive studies, angiography or surgery at this time The patient should continue walking and begin a more formal exercise program.  The patient should continue antiplatelet therapy and aggressive treatment of the lipid abnormalities  No changes in the patient's medications at this time  Continued surveillance is indicated as atherosclerosis is likely to progress with time.    The patient will continue follow up with noninvasive  studies as ordered.  - VAS [place] ABI WITH/WO TBI; Future  2. Essential hypertension Continue antihypertensive medications as already ordered, these medications have been reviewed and there are no changes at this time.  3. COPD with bronchial hyperresponsiveness (HCC) Continue pulmonary medications and aerosols as already ordered, these medications have been reviewed and there are no changes at this time.   4. Stage 3a chronic kidney disease (HCC) The patient has advanced renal disease.  However, at the present time the patient is not yet on dialysis.  Avoid nephrotoxic medications and dehydration.  Further plans per nephrology  5. Mixed hyperlipidemia Continue statin as ordered and reviewed, no changes at this time    Levora Dredge, MD  10/13/2022 2:22 PM

## 2022-10-14 DIAGNOSIS — G629 Polyneuropathy, unspecified: Secondary | ICD-10-CM | POA: Diagnosis not present

## 2022-10-14 DIAGNOSIS — R41841 Cognitive communication deficit: Secondary | ICD-10-CM | POA: Diagnosis not present

## 2022-10-14 DIAGNOSIS — R293 Abnormal posture: Secondary | ICD-10-CM | POA: Diagnosis not present

## 2022-10-14 DIAGNOSIS — N312 Flaccid neuropathic bladder, not elsewhere classified: Secondary | ICD-10-CM | POA: Diagnosis not present

## 2022-10-14 DIAGNOSIS — M6259 Muscle wasting and atrophy, not elsewhere classified, multiple sites: Secondary | ICD-10-CM | POA: Diagnosis not present

## 2022-10-14 DIAGNOSIS — K5900 Constipation, unspecified: Secondary | ICD-10-CM | POA: Diagnosis not present

## 2022-10-14 DIAGNOSIS — G35 Multiple sclerosis: Secondary | ICD-10-CM | POA: Diagnosis not present

## 2022-10-14 DIAGNOSIS — G4733 Obstructive sleep apnea (adult) (pediatric): Secondary | ICD-10-CM | POA: Diagnosis not present

## 2022-10-15 DIAGNOSIS — K5900 Constipation, unspecified: Secondary | ICD-10-CM | POA: Diagnosis not present

## 2022-10-15 DIAGNOSIS — R41841 Cognitive communication deficit: Secondary | ICD-10-CM | POA: Diagnosis not present

## 2022-10-15 DIAGNOSIS — G4733 Obstructive sleep apnea (adult) (pediatric): Secondary | ICD-10-CM | POA: Diagnosis not present

## 2022-10-15 DIAGNOSIS — G629 Polyneuropathy, unspecified: Secondary | ICD-10-CM | POA: Diagnosis not present

## 2022-10-15 DIAGNOSIS — R293 Abnormal posture: Secondary | ICD-10-CM | POA: Diagnosis not present

## 2022-10-15 DIAGNOSIS — G35 Multiple sclerosis: Secondary | ICD-10-CM | POA: Diagnosis not present

## 2022-10-15 DIAGNOSIS — N312 Flaccid neuropathic bladder, not elsewhere classified: Secondary | ICD-10-CM | POA: Diagnosis not present

## 2022-10-15 DIAGNOSIS — M6259 Muscle wasting and atrophy, not elsewhere classified, multiple sites: Secondary | ICD-10-CM | POA: Diagnosis not present

## 2022-10-16 DIAGNOSIS — N312 Flaccid neuropathic bladder, not elsewhere classified: Secondary | ICD-10-CM | POA: Diagnosis not present

## 2022-10-16 DIAGNOSIS — G35 Multiple sclerosis: Secondary | ICD-10-CM | POA: Diagnosis not present

## 2022-10-16 DIAGNOSIS — S0121XD Laceration without foreign body of nose, subsequent encounter: Secondary | ICD-10-CM | POA: Diagnosis not present

## 2022-10-16 DIAGNOSIS — K5900 Constipation, unspecified: Secondary | ICD-10-CM | POA: Diagnosis not present

## 2022-10-16 DIAGNOSIS — G629 Polyneuropathy, unspecified: Secondary | ICD-10-CM | POA: Diagnosis not present

## 2022-10-16 DIAGNOSIS — M6259 Muscle wasting and atrophy, not elsewhere classified, multiple sites: Secondary | ICD-10-CM | POA: Diagnosis not present

## 2022-10-16 DIAGNOSIS — R41841 Cognitive communication deficit: Secondary | ICD-10-CM | POA: Diagnosis not present

## 2022-10-16 DIAGNOSIS — R293 Abnormal posture: Secondary | ICD-10-CM | POA: Diagnosis not present

## 2022-10-16 DIAGNOSIS — G4733 Obstructive sleep apnea (adult) (pediatric): Secondary | ICD-10-CM | POA: Diagnosis not present

## 2022-10-29 DIAGNOSIS — F4325 Adjustment disorder with mixed disturbance of emotions and conduct: Secondary | ICD-10-CM | POA: Diagnosis not present

## 2022-10-29 DIAGNOSIS — F331 Major depressive disorder, recurrent, moderate: Secondary | ICD-10-CM | POA: Diagnosis not present

## 2022-10-30 ENCOUNTER — Encounter: Payer: Self-pay | Admitting: Family Medicine

## 2022-10-30 ENCOUNTER — Inpatient Hospital Stay: Payer: Medicare HMO

## 2022-10-30 ENCOUNTER — Emergency Department: Payer: Medicare HMO

## 2022-10-30 ENCOUNTER — Other Ambulatory Visit: Payer: Self-pay

## 2022-10-30 ENCOUNTER — Inpatient Hospital Stay
Admission: EM | Admit: 2022-10-30 | Discharge: 2022-11-02 | DRG: 698 | Disposition: A | Discharge status: Skilled Nursing Facility | Payer: Medicare HMO | Source: Skilled Nursing Facility | Attending: Hospitalist | Admitting: Hospitalist

## 2022-10-30 DIAGNOSIS — F419 Anxiety disorder, unspecified: Secondary | ICD-10-CM | POA: Diagnosis present

## 2022-10-30 DIAGNOSIS — R4182 Altered mental status, unspecified: Secondary | ICD-10-CM | POA: Diagnosis not present

## 2022-10-30 DIAGNOSIS — Y846 Urinary catheterization as the cause of abnormal reaction of the patient, or of later complication, without mention of misadventure at the time of the procedure: Secondary | ICD-10-CM | POA: Diagnosis present

## 2022-10-30 DIAGNOSIS — F3341 Major depressive disorder, recurrent, in partial remission: Secondary | ICD-10-CM | POA: Diagnosis present

## 2022-10-30 DIAGNOSIS — Z1152 Encounter for screening for COVID-19: Secondary | ICD-10-CM

## 2022-10-30 DIAGNOSIS — Z978 Presence of other specified devices: Secondary | ICD-10-CM

## 2022-10-30 DIAGNOSIS — L299 Pruritus, unspecified: Secondary | ICD-10-CM | POA: Diagnosis present

## 2022-10-30 DIAGNOSIS — R001 Bradycardia, unspecified: Secondary | ICD-10-CM | POA: Diagnosis not present

## 2022-10-30 DIAGNOSIS — J449 Chronic obstructive pulmonary disease, unspecified: Secondary | ICD-10-CM | POA: Diagnosis present

## 2022-10-30 DIAGNOSIS — R9082 White matter disease, unspecified: Secondary | ICD-10-CM | POA: Diagnosis not present

## 2022-10-30 DIAGNOSIS — Z7401 Bed confinement status: Secondary | ICD-10-CM | POA: Diagnosis not present

## 2022-10-30 DIAGNOSIS — G4489 Other headache syndrome: Secondary | ICD-10-CM | POA: Diagnosis not present

## 2022-10-30 DIAGNOSIS — I10 Essential (primary) hypertension: Secondary | ICD-10-CM | POA: Diagnosis not present

## 2022-10-30 DIAGNOSIS — Z818 Family history of other mental and behavioral disorders: Secondary | ICD-10-CM

## 2022-10-30 DIAGNOSIS — N319 Neuromuscular dysfunction of bladder, unspecified: Secondary | ICD-10-CM | POA: Diagnosis present

## 2022-10-30 DIAGNOSIS — E785 Hyperlipidemia, unspecified: Secondary | ICD-10-CM | POA: Diagnosis present

## 2022-10-30 DIAGNOSIS — N1831 Chronic kidney disease, stage 3a: Secondary | ICD-10-CM | POA: Diagnosis present

## 2022-10-30 DIAGNOSIS — Z9851 Tubal ligation status: Secondary | ICD-10-CM

## 2022-10-30 DIAGNOSIS — I6782 Cerebral ischemia: Secondary | ICD-10-CM | POA: Diagnosis not present

## 2022-10-30 DIAGNOSIS — I739 Peripheral vascular disease, unspecified: Secondary | ICD-10-CM | POA: Diagnosis not present

## 2022-10-30 DIAGNOSIS — Z4781 Encounter for orthopedic aftercare following surgical amputation: Secondary | ICD-10-CM | POA: Diagnosis not present

## 2022-10-30 DIAGNOSIS — G9340 Encephalopathy, unspecified: Secondary | ICD-10-CM | POA: Diagnosis not present

## 2022-10-30 DIAGNOSIS — M6259 Muscle wasting and atrophy, not elsewhere classified, multiple sites: Secondary | ICD-10-CM | POA: Diagnosis not present

## 2022-10-30 DIAGNOSIS — R0689 Other abnormalities of breathing: Secondary | ICD-10-CM | POA: Diagnosis not present

## 2022-10-30 DIAGNOSIS — S0990XA Unspecified injury of head, initial encounter: Secondary | ICD-10-CM | POA: Diagnosis not present

## 2022-10-30 DIAGNOSIS — T83511A Infection and inflammatory reaction due to indwelling urethral catheter, initial encounter: Secondary | ICD-10-CM | POA: Diagnosis not present

## 2022-10-30 DIAGNOSIS — R21 Rash and other nonspecific skin eruption: Secondary | ICD-10-CM | POA: Diagnosis present

## 2022-10-30 DIAGNOSIS — N390 Urinary tract infection, site not specified: Secondary | ICD-10-CM | POA: Diagnosis present

## 2022-10-30 DIAGNOSIS — Z741 Need for assistance with personal care: Secondary | ICD-10-CM | POA: Diagnosis not present

## 2022-10-30 DIAGNOSIS — Z89512 Acquired absence of left leg below knee: Secondary | ICD-10-CM | POA: Diagnosis not present

## 2022-10-30 DIAGNOSIS — J441 Chronic obstructive pulmonary disease with (acute) exacerbation: Secondary | ICD-10-CM

## 2022-10-30 DIAGNOSIS — N183 Chronic kidney disease, stage 3 unspecified: Secondary | ICD-10-CM | POA: Diagnosis not present

## 2022-10-30 DIAGNOSIS — I700 Atherosclerosis of aorta: Secondary | ICD-10-CM | POA: Diagnosis present

## 2022-10-30 DIAGNOSIS — R41841 Cognitive communication deficit: Secondary | ICD-10-CM | POA: Diagnosis not present

## 2022-10-30 DIAGNOSIS — R509 Fever, unspecified: Secondary | ICD-10-CM | POA: Diagnosis not present

## 2022-10-30 DIAGNOSIS — L03116 Cellulitis of left lower limb: Secondary | ICD-10-CM | POA: Diagnosis not present

## 2022-10-30 DIAGNOSIS — N139 Obstructive and reflux uropathy, unspecified: Secondary | ICD-10-CM | POA: Diagnosis not present

## 2022-10-30 DIAGNOSIS — Z841 Family history of disorders of kidney and ureter: Secondary | ICD-10-CM

## 2022-10-30 DIAGNOSIS — F909 Attention-deficit hyperactivity disorder, unspecified type: Secondary | ICD-10-CM | POA: Diagnosis present

## 2022-10-30 DIAGNOSIS — Z79899 Other long term (current) drug therapy: Secondary | ICD-10-CM

## 2022-10-30 DIAGNOSIS — Z7951 Long term (current) use of inhaled steroids: Secondary | ICD-10-CM

## 2022-10-30 DIAGNOSIS — I5032 Chronic diastolic (congestive) heart failure: Secondary | ICD-10-CM | POA: Diagnosis present

## 2022-10-30 DIAGNOSIS — G35 Multiple sclerosis: Secondary | ICD-10-CM | POA: Diagnosis present

## 2022-10-30 DIAGNOSIS — R0902 Hypoxemia: Secondary | ICD-10-CM | POA: Diagnosis not present

## 2022-10-30 DIAGNOSIS — Z8249 Family history of ischemic heart disease and other diseases of the circulatory system: Secondary | ICD-10-CM

## 2022-10-30 DIAGNOSIS — Z6841 Body Mass Index (BMI) 40.0 and over, adult: Secondary | ICD-10-CM | POA: Diagnosis not present

## 2022-10-30 DIAGNOSIS — I129 Hypertensive chronic kidney disease with stage 1 through stage 4 chronic kidney disease, or unspecified chronic kidney disease: Secondary | ICD-10-CM | POA: Diagnosis not present

## 2022-10-30 DIAGNOSIS — Z66 Do not resuscitate: Secondary | ICD-10-CM | POA: Diagnosis not present

## 2022-10-30 DIAGNOSIS — I130 Hypertensive heart and chronic kidney disease with heart failure and stage 1 through stage 4 chronic kidney disease, or unspecified chronic kidney disease: Secondary | ICD-10-CM | POA: Diagnosis not present

## 2022-10-30 DIAGNOSIS — G4733 Obstructive sleep apnea (adult) (pediatric): Secondary | ICD-10-CM | POA: Diagnosis not present

## 2022-10-30 DIAGNOSIS — W19XXXA Unspecified fall, initial encounter: Secondary | ICD-10-CM | POA: Diagnosis not present

## 2022-10-30 DIAGNOSIS — R9089 Other abnormal findings on diagnostic imaging of central nervous system: Secondary | ICD-10-CM | POA: Diagnosis not present

## 2022-10-30 DIAGNOSIS — I959 Hypotension, unspecified: Secondary | ICD-10-CM | POA: Diagnosis present

## 2022-10-30 DIAGNOSIS — R338 Other retention of urine: Secondary | ICD-10-CM | POA: Diagnosis not present

## 2022-10-30 DIAGNOSIS — J80 Acute respiratory distress syndrome: Secondary | ICD-10-CM | POA: Diagnosis not present

## 2022-10-30 DIAGNOSIS — Z0389 Encounter for observation for other suspected diseases and conditions ruled out: Secondary | ICD-10-CM | POA: Diagnosis not present

## 2022-10-30 DIAGNOSIS — G9341 Metabolic encephalopathy: Secondary | ICD-10-CM | POA: Diagnosis not present

## 2022-10-30 DIAGNOSIS — Z87891 Personal history of nicotine dependence: Secondary | ICD-10-CM

## 2022-10-30 DIAGNOSIS — C8293 Follicular lymphoma, unspecified, intra-abdominal lymph nodes: Secondary | ICD-10-CM

## 2022-10-30 DIAGNOSIS — W07XXXA Fall from chair, initial encounter: Secondary | ICD-10-CM | POA: Diagnosis not present

## 2022-10-30 DIAGNOSIS — N312 Flaccid neuropathic bladder, not elsewhere classified: Secondary | ICD-10-CM | POA: Diagnosis present

## 2022-10-30 DIAGNOSIS — Z5181 Encounter for therapeutic drug level monitoring: Secondary | ICD-10-CM

## 2022-10-30 LAB — COMPREHENSIVE METABOLIC PANEL
ALT: 13 U/L (ref 0–44)
AST: 19 U/L (ref 15–41)
Albumin: 3.9 g/dL (ref 3.5–5.0)
Alkaline Phosphatase: 88 U/L (ref 38–126)
Anion gap: 10 (ref 5–15)
BUN: 24 mg/dL — ABNORMAL HIGH (ref 8–23)
CO2: 25 mmol/L (ref 22–32)
Calcium: 8.9 mg/dL (ref 8.9–10.3)
Chloride: 99 mmol/L (ref 98–111)
Creatinine, Ser: 1.38 mg/dL — ABNORMAL HIGH (ref 0.44–1.00)
GFR, Estimated: 41 mL/min — ABNORMAL LOW (ref >60)
Glucose, Bld: 112 mg/dL — ABNORMAL HIGH (ref 70–99)
Potassium: 3.9 mmol/L (ref 3.5–5.1)
Sodium: 134 mmol/L — ABNORMAL LOW (ref 135–145)
Total Bilirubin: 0.9 mg/dL (ref 0.3–1.2)
Total Protein: 7.8 g/dL (ref 6.5–8.1)

## 2022-10-30 LAB — BLOOD GAS, VENOUS
Acid-base deficit: 0.8 mmol/L (ref 0.0–2.0)
Bicarbonate: 23.5 mmol/L (ref 20.0–28.0)
O2 Saturation: 99.6 %
Patient temperature: 37
pCO2, Ven: 44 mmHg (ref 44–60)
pCO2, Ven: 37 mmHg — ABNORMAL LOW (ref 44–60)
pH, Ven: 7.41 (ref 7.25–7.43)
pO2, Ven: 103 mmHg — ABNORMAL HIGH (ref 32–45)

## 2022-10-30 LAB — CBC WITH DIFFERENTIAL/PLATELET
Abs Immature Granulocytes: 0.08 10*3/uL — ABNORMAL HIGH (ref 0.00–0.07)
Basophils Absolute: 0.1 10*3/uL (ref 0.0–0.1)
Basophils Relative: 1 %
Eosinophils Absolute: 0 10*3/uL (ref 0.0–0.5)
Eosinophils Relative: 0 %
HCT: 41.3 % (ref 36.0–46.0)
Hemoglobin: 13 g/dL (ref 12.0–15.0)
Immature Granulocytes: 1 %
Lymphocytes Relative: 5 %
Lymphs Abs: 0.6 10*3/uL — ABNORMAL LOW (ref 0.7–4.0)
MCH: 26.5 pg (ref 26.0–34.0)
MCHC: 31.5 g/dL (ref 30.0–36.0)
MCV: 84.3 fL (ref 80.0–100.0)
Monocytes Absolute: 0.9 10*3/uL (ref 0.1–1.0)
Monocytes Relative: 8 %
Neutro Abs: 10.1 10*3/uL — ABNORMAL HIGH (ref 1.7–7.7)
Neutrophils Relative %: 85 %
Platelets: 208 10*3/uL (ref 150–400)
RBC: 4.9 MIL/uL (ref 3.87–5.11)
RDW: 15 % (ref 11.5–15.5)
WBC: 11.7 10*3/uL — ABNORMAL HIGH (ref 4.0–10.5)
nRBC: 0 % (ref 0.0–0.2)

## 2022-10-30 LAB — URINALYSIS, COMPLETE (UACMP) WITH MICROSCOPIC
Bilirubin Urine: NEGATIVE
Glucose, UA: NEGATIVE mg/dL
Ketones, ur: NEGATIVE mg/dL
Nitrite: POSITIVE — AB
Protein, ur: 100 mg/dL — AB
Specific Gravity, Urine: 1.013 (ref 1.005–1.030)
WBC, UA: >50 WBC/hpf (ref 0–5)
pH: 6 (ref 5.0–8.0)

## 2022-10-30 LAB — BASIC METABOLIC PANEL
Anion gap: 10 (ref 5–15)
BUN: 25 mg/dL — ABNORMAL HIGH (ref 8–23)
CO2: 20 mmol/L — ABNORMAL LOW (ref 22–32)
Calcium: 8.3 mg/dL — ABNORMAL LOW (ref 8.9–10.3)
Chloride: 101 mmol/L (ref 98–111)
Creatinine, Ser: 1.27 mg/dL — ABNORMAL HIGH (ref 0.44–1.00)
GFR, Estimated: 45 mL/min — ABNORMAL LOW (ref >60)
Glucose, Bld: 182 mg/dL — ABNORMAL HIGH (ref 70–99)
Potassium: 3.8 mmol/L (ref 3.5–5.1)
Sodium: 131 mmol/L — ABNORMAL LOW (ref 135–145)

## 2022-10-30 LAB — TROPONIN I (HIGH SENSITIVITY)
Troponin I (High Sensitivity): 13 ng/L (ref <18)
Troponin I (High Sensitivity): 13 ng/L (ref <18)

## 2022-10-30 LAB — GLUCOSE, CAPILLARY: Glucose-Capillary: 166 mg/dL — ABNORMAL HIGH (ref 70–99)

## 2022-10-30 LAB — LACTIC ACID, PLASMA
Lactic Acid, Venous: 1.7 mmol/L (ref 0.5–1.9)
Lactic Acid, Venous: 1.5 mmol/L (ref 0.5–1.9)

## 2022-10-30 LAB — CK: Total CK: 136 U/L (ref 38–234)

## 2022-10-30 MED ORDER — ONDANSETRON HCL 4 MG/2ML IJ SOLN: 4.0000 mg | Freq: Four times a day (QID) | INTRAMUSCULAR | Status: DC | PRN

## 2022-10-30 MED ORDER — METHYLPREDNISOLONE SODIUM SUCC 125 MG IJ SOLR
125.0000 mg | Freq: Two times a day (BID) | INTRAMUSCULAR | Status: AC
  Administered 2022-10-30 – 2022-10-31 (×2): 125 mg via INTRAVENOUS
  Filled 2022-10-30 (×2): qty 2

## 2022-10-30 MED ORDER — ACETAMINOPHEN 325 MG PO TABS
650.0000 mg | ORAL_TABLET | Freq: Four times a day (QID) | ORAL | Status: DC | PRN
  Administered 2022-10-30 – 2022-11-01 (×2): 650 mg via ORAL
  Filled 2022-10-30 (×2): qty 2

## 2022-10-30 MED ORDER — LACTATED RINGERS IV BOLUS (SEPSIS)
500.0000 mL | Freq: Once | INTRAVENOUS | Status: AC
  Administered 2022-10-30: 500 mL via INTRAVENOUS

## 2022-10-30 MED ORDER — SODIUM CHLORIDE 0.9 % IV SOLN
2.0000 g | Freq: Once | INTRAVENOUS | Status: AC
  Administered 2022-10-30: 2 g via INTRAVENOUS
  Filled 2022-10-30: qty 12.5

## 2022-10-30 MED ORDER — LACTATED RINGERS IV SOLN: INTRAVENOUS | Status: DC

## 2022-10-30 MED ORDER — SODIUM CHLORIDE 0.9 % IV SOLN: INTRAVENOUS | Status: DC

## 2022-10-30 MED ORDER — ONDANSETRON HCL 4 MG PO TABS: 4.0000 mg | ORAL_TABLET | Freq: Four times a day (QID) | ORAL | Status: DC | PRN

## 2022-10-30 MED ORDER — IPRATROPIUM-ALBUTEROL 0.5-2.5 (3) MG/3ML IN SOLN: 3.0000 mL | RESPIRATORY_TRACT | Status: DC | PRN

## 2022-10-30 MED ORDER — ENOXAPARIN SODIUM 60 MG/0.6ML IJ SOSY
60.0000 mg | PREFILLED_SYRINGE | INTRAMUSCULAR | Status: DC
  Administered 2022-10-30 – 2022-11-01 (×3): 60 mg via SUBCUTANEOUS
  Filled 2022-10-30 (×3): qty 0.6

## 2022-10-30 MED ORDER — PREDNISONE 20 MG PO TABS
40.0000 mg | ORAL_TABLET | Freq: Every day | ORAL | Status: DC
  Administered 2022-10-31 – 2022-11-02 (×3): 40 mg via ORAL
  Filled 2022-10-30 (×3): qty 2

## 2022-10-30 MED ORDER — SODIUM CHLORIDE 0.9 % IV SOLN
2.0000 g | INTRAVENOUS | Status: DC
  Administered 2022-10-31: 2 g via INTRAVENOUS
  Filled 2022-10-30: qty 12.5

## 2022-10-30 NOTE — Assessment & Plan Note (Signed)
Baseline MS with neurogenic bladder CT head grossly stable Otherwise monitor

## 2022-10-30 NOTE — Progress Notes (Signed)
Patient declined CPAP PRN at this time. Last VBG results:    Latest Reference Range & Units 10/30/22 20:04  pH, Ven 7.25 - 7.43  7.41  pCO2, Ven 44 - 60 mmHg 37 (L)  pO2, Ven 32 - 45 mmHg 103 (H)  Acid-base deficit 0.0 - 2.0 mmol/L 0.8  Bicarbonate 20.0 - 28.0 mmol/L 23.5  O2 Saturation % 99.6  (L): Data is abnormally low (H): Data is abnormally high

## 2022-10-30 NOTE — Progress Notes (Signed)
Patient's mentation has changed since last neuro check per dayshift RN during shift change bedside report. Patient is lethargic and falling asleep during conversation which is different than before. Patient only able to tell this RN her name and birthday. Patient disoriented to place, time and situation. Patient was oriented x3 when arriving to this unit per dayshift RN.   RN called rapid response due to mentation change.   NP, CN and RR RN at bedside.   See new orders.

## 2022-10-30 NOTE — Assessment & Plan Note (Signed)
Positive attempted fall with nursing staff attempted to transfer a patient from wheelchair living facility CT head grossly stable Noted generalized weakness in the setting of UTI as well as COPD exacerbation Will monitor for now PT OT evaluation Fall precautions Follow-up

## 2022-10-30 NOTE — H&P (Signed)
History and Physical    Patient: [name] UJW:[id number] DOB: [birth year] DOA: 10/30/2022 DOS: the patient was seen and examined on 10/30/2022 PCP: Lauro Regulus, MD  Patient coming from: SNF  Chief Complaint:  Chief Complaint  Patient presents with   Urinary Tract Infection   Fall   HPI: [name] is a 72 y.o. female with medical history significant of Multiple sclerosis, neurogenic bladder, stage III CKD, history of subdural hematoma status postcraniotomy, peripheral vascular disease, sleep apnea, hyperlipidemia, COPD exacerbation presenting with fall, encephalopathy, UTI.  Limited history in setting of encephalopathy.  Per report, patient had a fall at facility where staff was trying to ambulate patient from her wheelchair.  Upon reassessment, patient noted to be?  Febrile with worsening weakness per report.  Also with worsening change in urine morphology.  Patient does report having some lower abdominal pain.  Also with cough wheezing and increased sputum production.  Patient denies any active tobacco use.  No chest pain.  No hemiparesis.  Noted admission September 2023 with Pseudomonas UTI Presented to the ER Tmax 100.4, hemodynamically stable.  Satting well on room air.  White count 11.7, hemoglobin 13, platelets 208, creatinine 1.4, urinalysis indicative of infection.  CT head as well as chest x-ray grossly stable. Review of Systems: As mentioned in the history of present illness. All other systems reviewed and are negative. Past Medical History:  Diagnosis Date   Abdominal aortic atherosclerosis (HCC) 11/11/2016   ADHD    Anxiety    COPD (chronic obstructive pulmonary disease) (HCC)    Depression    major depressive   Dyspnea    doe   Edema    left leg   Follicular lymphoma (HCC)    B Cell   Follicular lymphoma grade II (HCC)    Hypertension    Hypotension    idiopathic   Kyphoscoliosis and scoliosis 11/26/2011   Morbid obesity (HCC) 01/05/2016   Multiple sclerosis  (HCC)    Multiple sclerosis (HCC)    1980's   Neuromuscular disorder (HCC)    Obstructive and reflux uropathy    foley   Pain    atypical facial   Peripheral vascular disease of lower extremity with ulceration (HCC) 11/08/2015   Skin ulcer (HCC) 11/08/2015   Weakness    generalized. has MS   Past Surgical History:  Procedure Laterality Date   AMPUTATION Left 02/01/2022   Procedure: AMPUTATION BELOW KNEE;  Surgeon: Annice Needy, MD;  Location: [facility];  Service: Vascular;  Laterality: Left;   BACK SURGERY N/A 2002   BONE BIOPSY Left 12/24/2021   Procedure: BONE BIOPSY-HEEL BONE;  Surgeon: Vivi Barrack, DPM;  Location: [facility];  Service: Podiatry;  Laterality: Left;   CYST EXCISION     lower back   INGUINAL LYMPH NODE BIOPSY Left 07/04/2016   Procedure: INGUINAL LYMPH NODE BIOPSY;  Surgeon: Kieth Brightly, MD;  Location: [facility];  Service: General;  Laterality: Left;   ORIF FEMUR FRACTURE Left 02/04/2020   Procedure: OPEN REDUCTION INTERNAL FIXATION (ORIF) DISTAL FEMUR FRACTURE;  Surgeon: Roby Lofts, MD;  Location: [facility];  Service: Orthopedics;  Laterality: Left;   PORTACATH PLACEMENT N/A 07/22/2016   Procedure: INSERTION PORT-A-CATH;  Surgeon: Kieth Brightly, MD;  Location: [facility];  Service: General;  Laterality: N/A;   TONSILLECTOMY AND ADENOIDECTOMY     TUBAL LIGATION     WOUND DEBRIDEMENT Left 12/24/2021   Procedure: DEBRIDEMENT WOUND-HEEL ULCER;  Surgeon: Ovid Curd  R, DPM;  Location: [facility];  Service: Podiatry;  Laterality: Left;   Social History:  reports that she quit smoking about 6 years ago. Her smoking use included cigarettes. She started smoking about 27 years ago. She has a 20.00 pack-year smoking history. She has never used smokeless tobacco. She reports current drug use. Drug: Marijuana. She reports that she does not drink alcohol.  No Known Allergies  Family History  Problem Relation Age of Onset   COPD Mother    Diabetes Mother     Heart failure Mother    Alcohol abuse Father    Kidney disease Father    Kidney failure Father    Arthritis Sister    CAD Maternal Grandmother    Stroke Maternal Grandfather    Arthritis Sister    Mental illness Sister    Arthritis Brother     Prior to Admission medications   Medication Sig Start Date End Date Taking? Authorizing Provider  amLODipine (NORVASC) 5 MG tablet Take 5 mg by mouth daily. 12/09/21  Yes [provider]  budesonide-formoterol (SYMBICORT) 160-4.5 MCG/ACT inhaler Inhale 2 puffs into the lungs 2 (two) times daily.    Yes [provider]  buPROPion (WELLBUTRIN XL) 300 MG 24 hr tablet Take 300 mg by mouth daily. 03/21/21  Yes [provider]  clonazePAM (KLONOPIN) 0.5 MG tablet Take 1 tablet (0.5 mg total) by mouth 2 (two) times daily. 02/06/22  Yes Lurene Shadow, MD  Cranberry 400 MG CAPS Take 400 mg by mouth.   Yes [provider]  docusate sodium (COLACE) 100 MG capsule Take 1 capsule (100 mg total) by mouth 2 (two) times daily as needed. 11/24/19  Yes Darlin Priestly, MD  DULoxetine (CYMBALTA) 60 MG capsule Take 1 capsule (60 mg total) by mouth every morning. 10/14/16  Yes Altamese Dilling, MD  fluconazole (DIFLUCAN) 200 MG tablet Take 200 mg by mouth once. thursday 10/10/22  Yes [provider]  gabapentin (NEURONTIN) 600 MG tablet Take 600 mg by mouth 3 (three) times daily. 12/10/21  Yes [provider]  hydrOXYzine (ATARAX) 25 MG tablet Take 25 mg by mouth 3 (three) times daily as needed.   Yes [provider]  acetaminophen (TYLENOL) 500 MG tablet Take 1,000 mg by mouth every 8 (eight) hours as needed for moderate pain.    [provider]  Amino Acids-Protein Hydrolys (FEEDING SUPPLEMENT, PRO-STAT SUGAR FREE 64,) LIQD Take 30 mLs by mouth 3 (three) times daily with meals.    [provider]  atorvastatin (LIPITOR) 10 MG tablet Take 10 mg by mouth at bedtime.  Patient not taking: Reported  on 10/10/2022 01/14/19   [provider]  carboxymethylcellulose (REFRESH PLUS) 0.5 % SOLN 1 drop 3 (three) times daily as needed.    [provider]  Multiple Vitamin (MULTIVITAMIN WITH MINERALS) TABS tablet Take 1 tablet by mouth daily.    [provider]  MYRBETRIQ 50 MG TB24 tablet TAKE ONE TABLET BY MOUTH ONCE DAILY 04/19/20   Stoioff, Verna Czech, MD  naloxone [facility]) nasal spray 4 mg/0.1 mL Place 1 spray into the nose.    [provider]  oxyCODONE (OXY IR/ROXICODONE) 5 MG immediate release tablet Take 1 tablet (5 mg total) by mouth every 8 (eight) hours as needed for moderate pain or severe pain. 02/06/22   Lurene Shadow, MD  QUEtiapine Fumarate (SEROQUEL XR) 150 MG 24 hr tablet Take 150 mg by mouth at bedtime.     [provider]  torsemide (DEMADEX) 20 MG tablet Take 1 tablet (20 mg total) by mouth daily as needed (edema). 07/05/21   Arnetha Courser, MD  traZODone (DESYREL) 50 MG tablet Take 50 mg by mouth at bedtime. 03/21/21   [provider]  triamcinolone (KENALOG) 0.025 % cream Apply 1 Application topically 2 (two) times daily.    [provider]  vortioxetine HBr (TRINTELLIX) 5 MG TABS tablet Take 5 mg by mouth daily.    [provider]    Physical Exam: Vitals:   10/30/22 1205 10/30/22 1207 10/30/22 1209 10/30/22 1400  BP:  (!) 126/43  (!) 175/164  Pulse:  81  66  Resp:  15  18  Temp:  (!) 100.4 F (38 C)    TempSrc:  Oral    SpO2: 94% 96%    Weight:   125.2 kg   Height:   5\' 6"  (1.676 m)    Physical Exam Constitutional:      Appearance: She is obese.  HENT:     Head: Normocephalic and atraumatic.     Nose: Nose normal.  Eyes:     Pupils: Pupils are equal, round, and reactive to light.  Cardiovascular:     Rate and Rhythm: Normal rate and regular rhythm.  Pulmonary:     Comments: Positive audible wheezing diffusely Abdominal:     Comments: Obese abdomen, minimal tenderness to palpation   Musculoskeletal:        General: Normal range of motion.  Skin:    General: Skin is warm.  Neurological:     General: No focal deficit present.  Psychiatric:        Mood and Affect: Mood normal.     Data Reviewed:  There are no new results to review at this time. CT Head Wo Contrast CLINICAL DATA:  Fall from a wheelchair  EXAM: CT HEAD WITHOUT CONTRAST  TECHNIQUE: Contiguous axial images were obtained from the base of the skull through the vertex without intravenous contrast.  RADIATION DOSE REDUCTION: This exam was performed according to the departmental dose-optimization program which includes automated exposure control, adjustment of the mA and/or kV according to patient size and/or use of iterative reconstruction technique.  COMPARISON:  06/30/2021  FINDINGS: Brain: No evidence of acute infarction, hemorrhage, mass, mass effect, or midline shift. No hydrocephalus or extra-axial fluid collection. Partial empty sella. Age related cerebral atrophy. Periventricular white matter changes, likely the sequela of chronic small vessel ischemic disease.  Vascular: No hyperdense vessel.  Skull: Negative for fracture or focal lesion.  Sinuses/Orbits: Mucosal thickening in the ethmoid air cells, left maxillary sinus, and left greater than right sphenoid sinus. No acute finding in the orbits.  Other: The mastoid air cells are well aerated.  IMPRESSION: No acute intracranial process.  Electronically Signed   By: Wiliam Ke M.D.   On: 10/30/2022 12:42 DG Chest Port 1 View CLINICAL DATA:  Questionable sepsis  EXAM: PORTABLE CHEST 1 VIEW  COMPARISON:  01/23/2022  FINDINGS: Rotated radiograph. No consolidation, pneumothorax or effusion. Stable cardiopericardial silhouette without edema. Overlapping cardiac leads. Film is under penetrated.  IMPRESSION: Rotated under penetrated radiograph. No acute cardiopulmonary disease.  Electronically Signed   By: Karen Kays M.D.   On: 10/30/2022 12:42  Lab Results  Component Value Date   WBC 11.7 (H) 10/30/2022   HGB 13.0 10/30/2022   HCT 41.3 10/30/2022   MCV 84.3 10/30/2022   PLT 208 10/30/2022   Last metabolic panel Lab Results  Component Value Date  GLUCOSE 112 (H) 10/30/2022   NA 134 (L) 10/30/2022   K 3.9 10/30/2022   CL 99 10/30/2022   CO2 25 10/30/2022   BUN 24 (H) 10/30/2022   CREATININE 1.38 (H) 10/30/2022   GFRNONAA 41 (L) 10/30/2022   CALCIUM 8.9 10/30/2022   PHOS 2.7 01/29/2022   PROT 7.8 10/30/2022   ALBUMIN 3.9 10/30/2022   BILITOT 0.9 10/30/2022   ALKPHOS 88 10/30/2022   AST 19 10/30/2022   ALT 13 10/30/2022   ANIONGAP 10 10/30/2022    Assessment and Plan: UTI (urinary tract infection) Noted chronic indwelling Foley with change in urine morphology, lower pelvic pain and weakness Urinalysis indicative of infection Noted urine culture September 2023 with Pseudomonas IV cefepime for infectious coverage pending urine culture Foley exchanged in ER Follow  COPD (chronic obstructive pulmonary disease) (HCC) Acute COPD exacerbation with coughing, wheezing, increased sputum production Chest ray within normal limits No hypoxia present Check blood gas correlate IV Solu-Medrol, DuoNebs IV cefepime in the setting of UTI with prior history of Pseudomonas urine cultures Follow  Essential hypertension BP stable Titrate home regimen  Atonic neurogenic bladder Noted chronic indwelling Foley status post exchange in the ER in the setting of UTI Follow  Chronic kidney disease (CKD), stage III (moderate) (HCC) Creatinine 1.4 today with GFR in the 40s Appears near baseline Monitor  Multiple sclerosis (HCC) Baseline MS with neurogenic bladder CT head grossly stable Otherwise monitor   Major depressive disorder, recurrent, in partial remission (HCC) Continue home regimen  Hyperlipidemia Continue statin pending CK level  Obstructive sleep apnea CPAP       Advance Care Planning:   Code Status: DNR   Consults: None   Family Communication: Husband at the bedside   Severity of Illness: The appropriate patient status for this patient is INPATIENT. Inpatient status is judged to be reasonable and necessary in order to provide the required intensity of service to ensure the patient's safety. The patient's presenting symptoms, physical exam findings, and initial radiographic and laboratory data in the context of their chronic comorbidities is felt to place them at high risk for further clinical deterioration. Furthermore, it is not anticipated that the patient will be medically stable for discharge from the hospital within 2 midnights of admission.   * I certify that at the point of admission it is my clinical judgment that the patient will require inpatient hospital care spanning beyond 2 midnights from the point of admission due to high intensity of service, high risk for further deterioration and high frequency of surveillance required.*  Author: Floydene Flock, MD 10/30/2022 2:47 PM  For on call review [email or web address].

## 2022-10-30 NOTE — Consult Note (Signed)
Pharmacy Antibiotic Note  [name] is a 72 y.o. female admitted on 10/30/2022 with UTI.  Pharmacy has been consulted for cefepime dosing. Tmax 100.4, WBC 11.7, UA: + nitrite, large leukocytes, WBC > 50. Hx of Ucx >=100,000 COLONIES/mL PSEUDOMONAS AERUGINOSA in 12/2021. Ucx not obtained.   Plan: Cefepime 2 g daily for UTI w/o sepsis.   Height: 5\' 6"  (167.6 cm) Weight: 125.2 kg (276 lb) IBW/kg (Calculated) : 59.3  Temp (24hrs), Avg:100.4 F (38 C), Min:100.4 F (38 C), Max:100.4 F (38 C)  Recent Labs  Lab 10/30/22 1200 10/30/22 1212  WBC 11.7*  --   CREATININE 1.38*  --   LATICACIDVEN  --  1.7    Estimated Creatinine Clearance: 50.6 mL/min (A) (by C-G formula based on SCr of 1.38 mg/dL (H)).    No Known Allergies  Antimicrobials this admission: 7/3 cefepime >>    Dose adjustments this admission: None  Microbiology results: 7/3 BCx: pending  Thank you for allowing pharmacy to be a part of this patient's care.  Ronnald Ramp, PharmD, BCPS 10/30/2022 2:47 PM

## 2022-10-30 NOTE — Assessment & Plan Note (Signed)
Continue statin pending CK level 

## 2022-10-30 NOTE — Assessment & Plan Note (Addendum)
Noted chronic indwelling Foley with change in urine morphology, lower pelvic pain and weakness Urinalysis indicative of infection Noted urine culture September 2023 with Pseudomonas IV cefepime for infectious coverage pending urine culture Foley exchanged in ER Follow

## 2022-10-30 NOTE — Assessment & Plan Note (Signed)
Creatinine 1.4 today with GFR in the 40s Appears near baseline Monitor

## 2022-10-30 NOTE — Progress Notes (Signed)
       CROSS COVER NOTE  NAME: [name] MRN: [id number] DOB : [birth year]    Concern as stated by nurse / staff   Rapid response due to lethargy. Per report patient alert and oriented x3 now lethargic and only oreiented to herself FSBS 166     Pertinent findings on chart review: 72 year old female medical history significant of Multiple sclerosis, follicular lymphoma, neurogenic bladder, stage III CKD, history of subdural hematoma status postcraniotomy, peripheral vascular disease, sleep apnea, hyperlipidemia, COPD exacerbation presenting with fall, encephalopathy, UTI.  Limited history in setting of encephalopathy.  Per report, patient had a fall at facility where staff was trying to ambulate patient from her wheelchair.  CT head without acute findings. 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 Pink/red papular pruritic rash upper torso, arms patient indicates is not new but due to mental status was unable to elaborate on cause or treatment - review of chart concludes rash likely related to her follicular Type B lymphoma - for now utilize lotions and creams for treatment   Plan: Vbg- normal Chem panel  NA 131, creat improved 1.27 CT HEAD no acute findings Transfer to PCU for frequent neuro checks 250 cc NS bolus NPO Cbg every 4h MRI BRAIN - no acute findings EKG SB with LAFB Troponin normal   Husband called to inform of transfer and update on condition but no answer. Voicemail left for him go eall     Donnie Mesa NP [facility] Cross Cover 7pm-7am - check amion for availability Pager [phone]

## 2022-10-30 NOTE — ED Triage Notes (Signed)
Pt arrives from [facility] via [facility] with c/o a fall from a wheelchair and a possible UTI. Pt has an indwelling catheter and upon inspection of catheter tubing there are visible "chunks." Pt is A&Ox3, pt is unclear of month and is lethargic when answering questions.Pt denies pain, and pt has no recollection of falling out of the wheelchair.

## 2022-10-30 NOTE — Assessment & Plan Note (Signed)
CPAP.  

## 2022-10-30 NOTE — Significant Event (Signed)
Rapid Response Event Note   Reason for Call :  Lethargy, altered mental status  Initial Focused Assessment:  Rapid response RN arrived in patient's room with patient in bed surrounded by 1C staff including primary RN Junious Dresser. Per Idelle Leech, when doing handoff with patient's primary RN from dayshift, patient was noted to be altered earlier. She was more lethargic than earlier and unable to stay awake fully through a conversation. She had fallen prior to admission and has a history of COPD but earlier in the day had a VBG with CO2 WNL and a head CT with no acute findings.  Upon rapid response RN arrival, patient was able to move extremities, equal grips, pupils 4 mm reactive to light bilaterally. Oriented to self and place. She did require prompting, sometimes multiple times to stay engaged with questions. CBG 166. Vital signs showed BP 118/52, MAP 73. HR 54, RR 16 unlabored, oxygen saturation 96 % on room air.  Interventions:  None by this RN.  Plan of Care:  Patient to remain on 1C for now. Manuela Schwartz NP ordered a VBG, reviewing patient's chart, and making other adjustments as needed.  Event Summary:   MD Notified: Manuela Schwartz NP Call Time: 19:28 Arrival Time: 19:30 End Time: 19:40  Bennie Dallas, RN

## 2022-10-30 NOTE — Assessment & Plan Note (Signed)
-   Continue home regimen 

## 2022-10-30 NOTE — Progress Notes (Signed)
   10/30/22 1900  Spiritual Encounters  Type of Visit Initial  Care provided to: Patient  Conversation partners present during encounter Other (comment);Nurse  Referral source Code page  Reason for visit Code (Rapid response)  OnCall Visit Yes   Chaplain responded to rapid response with compassionate presence. Patient cared for by interdisciplinary team.  Chaplain spiritual support services remain available as the need arises.

## 2022-10-30 NOTE — Assessment & Plan Note (Signed)
BP stable Titrate home regimen 

## 2022-10-30 NOTE — ED Provider Notes (Signed)
[facility] Provider Note    Event Date/Time   First MD Initiated Contact with Patient 10/30/22 1158     (approximate)   History   Urinary Tract Infection and Fall   HPI  [name] is a 72 y.o. female with a history of diastolic CHF, COPD, CKD, peripheral vascular disease, OSA, and morbid obesity who presents with a fall and concern for possible UTI.  Per EMS, the patient was being transferred to her wheelchair which then fell backwards.  She may have hit her head but did not lose consciousness.  Subsequently she was noted to have a fever and was hypotensive.  It was also noticed at that time that her urinary catheter had solid material in it.  The patient herself does not remember if she hit her head.  She does report a headache.  She denies any other acute pain.  She denies any nausea or vomiting.  She does not feel more short of breath than usual.  I reviewed the past medical records.  The patient's last recent outpatient counter was with Dr. Gilda Crease from vascular surgery on 6/13 for follow-up of her peripheral vascular disease.  She was admitted to the hospital service in October of last year for sepsis secondary to Pseudomonas UTI and foot ulcer with cellulitis, as well as AKI, and CHF exacerbation.   Physical Exam   Triage Vital Signs: ED Triage Vitals  Enc Vitals Group     BP 10/30/22 1207 (!) 126/43     Pulse Rate 10/30/22 1207 81     Resp 10/30/22 1207 15     Temp 10/30/22 1207 (!) 100.4 F (38 C)     Temp Source 10/30/22 1207 Oral     SpO2 10/30/22 1205 94 %     Weight 10/30/22 1209 276 lb (125.2 kg)     Height 10/30/22 1209 5\' 6"  (1.676 m)     Head Circumference --      [facility] Flow --      Pain Score 10/30/22 1207 0     Pain Loc --      Pain Edu? --      Excl. in GC? --     Most recent vital signs: Vitals:   10/30/22 1207 10/30/22 1400  BP: (!) 126/43 (!) 175/164  Pulse: 81 66  Resp: 15 18  Temp: (!) 100.4 F (38 C)   SpO2: 96%       General: Alert and oriented, weak appearing and somewhat slow to respond to questions but in no acute distress. CV:  Good peripheral perfusion.  Resp:  Increased effort.  Diminished breath sounds bilaterally. Abd:  Soft and nontender.  No distention.  Other:  EOMI.  PERRLA.  Motor intact in all extremities.  Neck with full ROM, no meningeal signs.   ED Results / Procedures / Treatments   Labs (all labs ordered are listed, but only abnormal results are displayed) Labs Reviewed  COMPREHENSIVE METABOLIC PANEL - Abnormal; Notable for the following components:      Result Value   Sodium 134 (*)    Glucose, Bld 112 (*)    BUN 24 (*)    Creatinine, Ser 1.38 (*)    GFR, Estimated 41 (*)    All other components within normal limits  CBC WITH DIFFERENTIAL/PLATELET - Abnormal; Notable for the following components:   WBC 11.7 (*)    Neutro Abs 10.1 (*)    Lymphs Abs 0.6 (*)  Abs Immature Granulocytes 0.08 (*)    All other components within normal limits  URINALYSIS, COMPLETE (UACMP) WITH MICROSCOPIC - Abnormal; Notable for the following components:   Color, Urine YELLOW (*)    APPearance CLOUDY (*)    Hgb urine dipstick SMALL (*)    Protein, ur 100 (*)    Nitrite POSITIVE (*)    Leukocytes,Ua LARGE (*)    Bacteria, UA MANY (*)    All other components within normal limits  CULTURE, BLOOD (ROUTINE X 2)  CULTURE, BLOOD (ROUTINE X 2)  URINE CULTURE  LACTIC ACID, PLASMA  LACTIC ACID, PLASMA  BLOOD GAS, VENOUS  CK  TROPONIN I (HIGH SENSITIVITY)  TROPONIN I (HIGH SENSITIVITY)     EKG  ED ECG REPORT I, Dionne Bucy, the attending physician, personally viewed and interpreted this ECG.  Date: 10/30/2022 EKG Time: 1200 Rate: 82 Rhythm: normal sinus rhythm QRS Axis: normal Intervals: Possible LAFB ST/T Wave abnormalities: normal Narrative Interpretation: no evidence of acute ischemia    RADIOLOGY  Chest x-ray: I independently viewed and interpreted the images;  there is no focal consolidation or edema  CT head: No acute abnormality    PROCEDURES:  Critical Care performed: Yes, see critical care procedure note(s)  .Critical Care  Performed by: Dionne Bucy, MD Authorized by: Dionne Bucy, MD   Critical care provider statement:    Critical care time (minutes):  30   Critical care time was exclusive of:  Separately billable procedures and treating other patients   Critical care was necessary to treat or prevent imminent or life-threatening deterioration of the following conditions:  Sepsis   Critical care was time spent personally by me on the following activities:  Development of treatment plan with patient or surrogate, discussions with consultants, evaluation of patient's response to treatment, examination of patient, ordering and review of laboratory studies, ordering and review of radiographic studies, ordering and performing treatments and interventions, pulse oximetry, re-evaluation of patient's condition, review of old charts and obtaining history from patient or surrogate   Care discussed with: admitting provider      MEDICATIONS ORDERED IN ED: Medications  lactated ringers infusion ( Intravenous New Bag/Given 10/30/22 1309)  enoxaparin (LOVENOX) injection 60 mg (has no administration in time range)  ondansetron (ZOFRAN) tablet 4 mg (has no administration in time range)    Or  ondansetron (ZOFRAN) injection 4 mg (has no administration in time range)  methylPREDNISolone sodium succinate (SOLU-MEDROL) 125 mg/2 mL injection 125 mg (has no administration in time range)    Followed by  predniSONE (DELTASONE) tablet 40 mg (has no administration in time range)  ipratropium-albuterol (DUONEB) 0.5-2.5 (3) MG/3ML nebulizer solution 3 mL (has no administration in time range)  ceFEPIme (MAXIPIME) 2 g in sodium chloride 0.9 % 100 mL IVPB (has no administration in time range)  lactated ringers bolus 500 mL (0 mLs Intravenous Stopped  10/30/22 1309)  ceFEPIme (MAXIPIME) 2 g in sodium chloride 0.9 % 100 mL IVPB (0 g Intravenous Stopped 10/30/22 1350)     IMPRESSION / MDM / ASSESSMENT AND PLAN / ED COURSE  I reviewed the triage vital signs and the nursing notes.  72 year old female with PMH as noted above presents after a fall in her wheelchair as well as with fever and cloudy urine with solid material in the catheter.  The patient has an indwelling Foley.  On exam she has a low-grade fever with otherwise normal vital signs.  Neurologic exam is nonfocal.  There is no visible  trauma.  Differential diagnosis includes, but is not limited to, UTI, other acute infection/sepsis, dehydration, electrolyte abnormality, other metabolic etiology.  There is no clinical evidence for meningitis.  Neurologic exam is nonfocal but we will obtain CT head given the possible head trauma and headache.  Will obtain urinalysis, sepsis labs, and reassess.  Patient's presentation is most consistent with acute presentation with potential threat to life or bodily function.  The patient is on the cardiac monitor to evaluate for evidence of arrhythmia and/or significant heart rate changes.   ----------------------------------------- 2:10 PM on 10/30/2022 -----------------------------------------  Urinalysis is consistent with UTI.  CBC shows mild leukocytosis.  Lactate is normal.  Chest x-ray shows no acute findings.  CT head is also negative.  I ordered cefepime for the likely UTI.  The patient's catheter is being changed.  I consulted Dr. Alvester Morin from the hospitalist service; based on our discussion he agrees to evaluate the patient for admission.  FINAL CLINICAL IMPRESSION(S) / ED DIAGNOSES   Final diagnoses:  Urinary tract infection associated with indwelling urethral catheter, initial encounter (HCC)     Rx / DC Orders   ED Discharge Orders     None        Note:  This document was prepared using Dragon voice recognition software and may  include unintentional dictation errors.    Dionne Bucy, MD 10/30/22 [phone]

## 2022-10-30 NOTE — Assessment & Plan Note (Signed)
Acute generalized confusion in the setting of UTI and COPD Blood gas pending to rule out hypercarbic narcosis CT head stable IV cefepime for urinary and respiratory coverage Will otherwise monitor mentation Follow closely

## 2022-10-30 NOTE — Assessment & Plan Note (Signed)
Noted chronic indwelling Foley status post exchange in the ER in the setting of UTI Follow

## 2022-10-30 NOTE — Progress Notes (Signed)
Report given to Ian Malkin, RN PCU. Patient going to Rm [id number] for closer neuro observation.   Patient belongings taken with patient at this time.

## 2022-10-30 NOTE — Consult Note (Signed)
PHARMACY -  BRIEF ANTIBIOTIC NOTE   Pharmacy has received consult(s) for UTI from an ED provider.  The patient's profile has been reviewed for ht/wt/allergies/indication/available labs.    One time order(s) placed for cefepime  Further antibiotics/pharmacy consults should be ordered by admitting physician if indicated.                       Thank you, Ronnald Ramp 10/30/2022  1:23 PM

## 2022-10-30 NOTE — Assessment & Plan Note (Signed)
Acute COPD exacerbation with coughing, wheezing, increased sputum production Chest ray within normal limits No hypoxia present Check blood gas correlate IV Solu-Medrol, DuoNebs IV cefepime in the setting of UTI with prior history of Pseudomonas urine cultures Follow

## 2022-10-30 NOTE — ED Notes (Signed)
Pt to CT

## 2022-10-31 DIAGNOSIS — G9340 Encephalopathy, unspecified: Secondary | ICD-10-CM | POA: Diagnosis not present

## 2022-10-31 DIAGNOSIS — W19XXXA Unspecified fall, initial encounter: Secondary | ICD-10-CM | POA: Diagnosis not present

## 2022-10-31 DIAGNOSIS — R001 Bradycardia, unspecified: Secondary | ICD-10-CM

## 2022-10-31 LAB — COMPREHENSIVE METABOLIC PANEL
ALT: 15 U/L (ref 0–44)
AST: 16 U/L (ref 15–41)
Albumin: 3.1 g/dL — ABNORMAL LOW (ref 3.5–5.0)
Alkaline Phosphatase: 76 U/L (ref 38–126)
Anion gap: 9 (ref 5–15)
BUN: 25 mg/dL — ABNORMAL HIGH (ref 8–23)
CO2: 23 mmol/L (ref 22–32)
Calcium: 8.6 mg/dL — ABNORMAL LOW (ref 8.9–10.3)
Chloride: 102 mmol/L (ref 98–111)
Creatinine, Ser: 1.15 mg/dL — ABNORMAL HIGH (ref 0.44–1.00)
GFR, Estimated: 51 mL/min — ABNORMAL LOW (ref >60)
Glucose, Bld: 161 mg/dL — ABNORMAL HIGH (ref 70–99)
Potassium: 3.6 mmol/L (ref 3.5–5.1)
Sodium: 134 mmol/L — ABNORMAL LOW (ref 135–145)
Total Bilirubin: 0.6 mg/dL (ref 0.3–1.2)
Total Protein: 6.7 g/dL (ref 6.5–8.1)

## 2022-10-31 LAB — URINE CULTURE: Culture: NO GROWTH

## 2022-10-31 LAB — CBC
HCT: 36.7 % (ref 36.0–46.0)
Hemoglobin: 11.8 g/dL — ABNORMAL LOW (ref 12.0–15.0)
MCH: 26.8 pg (ref 26.0–34.0)
MCHC: 32.2 g/dL (ref 30.0–36.0)
MCV: 83.2 fL (ref 80.0–100.0)
Platelets: 143 10*3/uL — ABNORMAL LOW (ref 150–400)
RBC: 4.41 MIL/uL (ref 3.87–5.11)
RDW: 14.5 % (ref 11.5–15.5)
WBC: 7.3 10*3/uL (ref 4.0–10.5)
nRBC: 0 % (ref 0.0–0.2)

## 2022-10-31 LAB — RESPIRATORY PANEL BY PCR

## 2022-10-31 LAB — GLUCOSE, CAPILLARY
Glucose-Capillary: 141 mg/dL — ABNORMAL HIGH (ref 70–99)
Glucose-Capillary: 151 mg/dL — ABNORMAL HIGH (ref 70–99)
Glucose-Capillary: 151 mg/dL — ABNORMAL HIGH (ref 70–99)
Glucose-Capillary: 152 mg/dL — ABNORMAL HIGH (ref 70–99)
Glucose-Capillary: 158 mg/dL — ABNORMAL HIGH (ref 70–99)
Glucose-Capillary: 161 mg/dL — ABNORMAL HIGH (ref 70–99)
Glucose-Capillary: 162 mg/dL — ABNORMAL HIGH (ref 70–99)
Glucose-Capillary: 208 mg/dL — ABNORMAL HIGH (ref 70–99)

## 2022-10-31 LAB — SARS CORONAVIRUS 2 BY RT PCR: SARS Coronavirus 2 by RT PCR: NEGATIVE

## 2022-10-31 LAB — TROPONIN I (HIGH SENSITIVITY)
Troponin I (High Sensitivity): 10 ng/L (ref <18)
Troponin I (High Sensitivity): 7 ng/L (ref <18)

## 2022-10-31 MED ORDER — SODIUM CHLORIDE 0.9 % IV SOLN
2.0000 g | Freq: Two times a day (BID) | INTRAVENOUS | Status: DC
  Administered 2022-10-31 – 2022-11-01 (×3): 2 g via INTRAVENOUS
  Filled 2022-10-31 (×4): qty 12.5

## 2022-10-31 MED ORDER — BUPROPION HCL ER (XL) 150 MG PO TB24
300.0000 mg | ORAL_TABLET | Freq: Every day | ORAL | Status: DC
  Administered 2022-11-01 – 2022-11-02 (×2): 300 mg via ORAL
  Filled 2022-10-31 (×2): qty 2

## 2022-10-31 MED ORDER — CLONAZEPAM 0.5 MG PO TABS
0.5000 mg | ORAL_TABLET | Freq: Two times a day (BID) | ORAL | Status: DC | PRN
  Administered 2022-11-01: 0.5 mg via ORAL
  Filled 2022-10-31: qty 1

## 2022-10-31 MED ORDER — DIPHENHYDRAMINE HCL 25 MG PO CAPS
25.0000 mg | ORAL_CAPSULE | Freq: Four times a day (QID) | ORAL | Status: DC | PRN
  Administered 2022-10-31 – 2022-11-01 (×4): 25 mg via ORAL
  Filled 2022-10-31 (×4): qty 1

## 2022-10-31 MED ORDER — CHLORHEXIDINE GLUCONATE CLOTH 2 % EX PADS
6.0000 | MEDICATED_PAD | Freq: Every day | CUTANEOUS | Status: DC
  Administered 2022-10-31 – 2022-11-01 (×2): 6 via TOPICAL

## 2022-10-31 MED ORDER — DULOXETINE HCL 30 MG PO CPEP
60.0000 mg | ORAL_CAPSULE | Freq: Every day | ORAL | Status: DC
  Administered 2022-11-01 – 2022-11-02 (×2): 60 mg via ORAL
  Filled 2022-10-31 (×2): qty 2

## 2022-10-31 MED ORDER — SODIUM CHLORIDE 0.9 % IV BOLUS
250.0000 mL | Freq: Once | INTRAVENOUS | Status: AC
  Administered 2022-10-31: 250 mL via INTRAVENOUS

## 2022-10-31 MED ORDER — TRIAMCINOLONE 0.1 % CREAM:EUCERIN CREAM 1:1
TOPICAL_CREAM | Freq: Two times a day (BID) | CUTANEOUS | Status: DC
  Filled 2022-10-31 (×2): qty 1

## 2022-10-31 NOTE — Plan of Care (Signed)

## 2022-10-31 NOTE — Evaluation (Signed)
Occupational Therapy Evaluation Patient Details Name: [name] MRN: [id number] DOB: 1950-05-05 Today's Date: 10/31/2022   History of Present Illness Pt is a 72 year old woman admitted after a fall; Per EMS patient was being transferred to her wheelchair which then fell backward. Upon reassessment, patient noted to be febrile with worsening weakness; Admitted with UTI, encephalopathy; PMH significant for Multiple sclerosis, neurogenic bladder, stage III CKD, history of subdural hematoma status postcraniotomy, peripheral vascular disease, sleep apnea, hyperlipidemia, COPD exacerbation   Clinical Impression   Chart reviewed, pt greeted in bed, oriented to self and place. Co tx completed with PT on this date. PTA pt required assist for ADL/IADL, performed MRADLs with mwc and transferred via hoyer lift since 10/23 admission/BKA with subsequent discharge to rehab. Pt presents with deficits in strength, endurance, activity tolerance, balance, cognition all affecting safe and optimal ADL completion. Pt would benefit from skilled OT to address functional deficits and to facilitate improved safe ADL/functional mobility completion. Pt is left as received, all needs met. Nurse aware of pt status. Vss throughout.      Recommendations for follow up therapy are one component of a multi-disciplinary discharge planning process, led by the attending physician.  Recommendations may be updated based on patient status, additional functional criteria and insurance authorization.   Assistance Recommended at Discharge Frequent or constant Supervision/Assistance  Patient can return home with the following Two people to help with walking and/or transfers;A lot of help with bathing/dressing/bathroom;Direct supervision/assist for medications management;Direct supervision/assist for financial management    Functional Status Assessment  Patient has had a recent decline in their functional status and demonstrates the ability  to make significant improvements in function in a reasonable and predictable amount of time.  Equipment Recommendations  Other (comment) (per next venue of care)    Recommendations for Other Services       Precautions / Restrictions Precautions Precautions: Fall Precaution Comments: old L BKA Restrictions Weight Bearing Restrictions: No      Mobility Bed Mobility Overal bed mobility: Needs Assistance Bed Mobility: Supine to Sit, Sit to Supine     Supine to sit: Max assist, +2 for physical assistance, +2 for safety/equipment Sit to supine: Max assist, +2 for physical assistance, +2 for safety/equipment        Transfers                   General transfer comment: unsafe to attempt      Balance Overall balance assessment: Needs assistance Sitting-balance support: Feet supported Sitting balance-Leahy Scale: Poor Sitting balance - Comments: MIN A -intermittent SBA, pt stabilizing on bed with BUE Postural control: Posterior lean                                 ADL either performed or assessed with clinical judgement   ADL Overall ADL's : Needs assistance/impaired Eating/Feeding: Set up;Sitting   Grooming: Wash/dry face;Min guard;Sitting Grooming Details (indicate cue type and reason): sitting on edge of bed Upper Body Bathing: Maximal assistance   Lower Body Bathing: Maximal assistance   Upper Body Dressing : Moderate assistance   Lower Body Dressing: Maximal assistance;Total assistance Lower Body Dressing Details (indicate cue type and reason): socks   Toilet Transfer Details (indicate cue type and reason): unsafe to attempt on this date Toileting- Clothing Manipulation and Hygiene: Total assistance               Vision  Patient Visual Report: No change from baseline Additional Comments: will continue to assess            Pertinent Vitals/Pain Pain Assessment Pain Assessment: PAINAD Breathing: normal Negative Vocalization:  occasional moan/groan, low speech, negative/disapproving quality Facial Expression: sad, frightened, frown Body Language: tense, distressed pacing, fidgeting Consolability: distracted or reassured by voice/touch PAINAD Score: 4 Pain Intervention(s): Limited activity within patient's tolerance, Monitored during session, Repositioned (itchy throughout her body)     Hand Dominance     Extremity/Trunk Assessment Upper Extremity Assessment Upper Extremity Assessment: Generalized weakness   Lower Extremity Assessment Lower Extremity Assessment: LLE deficits/detail;Generalized weakness LLE Deficits / Details: L BKA 10/23       Communication Communication Communication: No difficulties   Cognition Arousal/Alertness: Awake/alert Behavior During Therapy: Flat affect Overall Cognitive Status: No family/caregiver present to determine baseline cognitive functioning Area of Impairment: Orientation, Attention, Memory, Following commands, Safety/judgement, Awareness                 Orientation Level: Disoriented to, Situation Current Attention Level: Sustained Memory: Decreased short-term memory Following Commands: Follows one step commands with increased time Safety/Judgement: Decreased awareness of safety, Decreased awareness of deficits Awareness: Emergent   General Comments: requires encouragement for participation     General Comments  spo2 100% throughout; HR in the 80s during mobility    Exercises Other Exercises Other Exercises: edu re: role of OT, role of rehab, importance of progressing mobility   Shoulder Instructions      Home Living Family/patient expects to be discharged to:: Skilled nursing facility                                 Additional Comments: pt reports she has been at "that place" ([facility])      Prior Functioning/Environment Prior Level of Function : Needs assist             Mobility Comments: pt reports non amb with hoyer lift  transfers to mwc, pt pushes MWC with RLE and BUE ADLs Comments: Per pt report pt requires assist for UB/LB dressing, bathing, transfer to toilet (however reports she has been wearing a brief, pt has a chronic foley), SET UP for grooming and feeding tasks        OT Problem List: Decreased strength;Decreased activity tolerance;Decreased knowledge of use of DME or AE;Decreased safety awareness;Decreased cognition;Impaired balance (sitting and/or standing)      OT Treatment/Interventions: Self-care/ADL training;DME and/or AE instruction;Therapeutic activities;Therapeutic exercise;Energy conservation;Patient/family education;Balance training    OT Goals(Current goals can be found in the care plan section) Acute Rehab OT Goals Patient Stated Goal: go home OT Goal Formulation: With patient Time For Goal Achievement: 11/14/22 Potential to Achieve Goals: Poor ADL Goals Pt Will Perform Grooming: with min assist;sitting Pt Will Perform Lower Body Dressing: with mod assist Pt Will Transfer to Toilet: with mod assist;squat pivot transfer Pt Will Perform Toileting - Clothing Manipulation and hygiene: with mod assist;sitting/lateral leans  OT Frequency: Min 1X/week    Co-evaluation PT/OT/SLP Co-Evaluation/Treatment: Yes Reason for Co-Treatment: Complexity of the patient's impairments (multi-system involvement);Necessary to address cognition/behavior during functional activity;For patient/therapist safety   OT goals addressed during session: ADL's and self-care      AM-PAC OT "6 Clicks" Daily Activity     Outcome Measure Help from another person eating meals?: None Help from another person taking care of personal grooming?: A Little Help from another person toileting, which includes  using toliet, bedpan, or urinal?: Total Help from another person bathing (including washing, rinsing, drying)?: Total Help from another person to put on and taking off regular upper body clothing?: A Lot Help from  another person to put on and taking off regular lower body clothing?: Total 6 Click Score: 12   End of Session Nurse Communication: Mobility status  Activity Tolerance: Patient tolerated treatment well Patient left: in bed;with call bell/phone within reach;with bed alarm set  OT Visit Diagnosis: Other abnormalities of gait and mobility (R26.89);Muscle weakness (generalized) (M62.81)                Time: 1610-9604 OT Time Calculation (min): 19 min Charges:  OT General Charges $OT Visit: 1 Visit OT Evaluation $OT Eval Moderate Complexity: 1 Mod  Oleta Mouse, OTD OTR/L  10/31/22, 3:22 PM

## 2022-10-31 NOTE — Evaluation (Signed)
Physical Therapy Evaluation Patient Details Name: [name] MRN: [id number] DOB: 1950-11-01 Today's Date: 10/31/2022  History of Present Illness  Pt is a 72 y.o. female presenting to hospital 10/30/22 s/p fall (when transferring to her w/c) and concern for possible UTI.  Pt admitted with UTI, acute COPD exacerbation, atonic neurogenic bladder, CKD, and MS.  Rapid response called 7/3 d/t lethargy/AMS and pt transferred to PCU (new therapy orders received).  PMH includes diastolic CHF, COPD, CKD, PVD, OSA, morbid obesity, multiple sclerosis, neurogenic bladder, h/o SDH s/p craniotomy, ADHD, L BKA 02/01/22, back surgery, L ORIF femur fx 2021.  Clinical Impression  PT/OT co-evaluation performed.  Pt oriented to self and place.  Prior to hospital admission, pt reports being at rehab; was using hoyer lift to transfer to manual w/c; able to propel manual w/c on own with B UE's and R LE.  Currently pt is max assist x2 with bed mobility; min assist to close SBA for sitting balance (d/t posterior lean); and unable to lateral scoot on edge of bed with 2 assist and cueing.  Pt would currently benefit from skilled PT to address noted impairments and functional limitations (see below for any additional details).  Upon hospital discharge, pt would benefit from ongoing therapy.     Assistance Recommended at Discharge Frequent or constant Supervision/Assistance  If plan is discharge home, recommend the following:  Can travel by private vehicle  Two people to help with walking and/or transfers;Two people to help with bathing/dressing/bathroom;Assistance with cooking/housework;Assist for transportation;Help with stairs or ramp for entrance   No    Equipment Recommendations Other (comment) (TBD at next facility)  Recommendations for Other Services       Functional Status Assessment Patient has had a recent decline in their functional status and/or demonstrates limited ability to make significant improvements in  function in a reasonable and predictable amount of time     Precautions / Restrictions Precautions Precautions: Fall Precaution Comments: L BKA (02/01/22) Restrictions Weight Bearing Restrictions: No      Mobility  Bed Mobility Overal bed mobility: Needs Assistance Bed Mobility: Supine to Sit, Sit to Supine     Supine to sit: Max assist, +2 for physical assistance, +2 for safety/equipment Sit to supine: Max assist, +2 for physical assistance, +2 for safety/equipment   General bed mobility comments: assist for trunk and B LE's    Transfers Overall transfer level: Needs assistance                 General transfer comment: pt unable to scoot laterally along bed with 2 assist and max cueing    Ambulation/Gait                  Stairs            Wheelchair Mobility     Tilt Bed    Modified Rankin (Stroke Patients Only)       Balance Overall balance assessment: Needs assistance Sitting-balance support: Bilateral upper extremity supported (R LE supported) Sitting balance-Leahy Scale: Poor Sitting balance - Comments: fluctuating between min assist and close SBA; pt using B UE's on bed to assist with balance Postural control: Posterior lean                                   Pertinent Vitals/Pain Pain Assessment Pain Assessment: PAINAD Negative Vocalization: occasional moan/groan, low speech, negative/disapproving quality Facial Expression: sad, frightened, frown  Body Language: tense, distressed pacing, fidgeting Consolability: distracted or reassured by voice/touch Pain Intervention(s): Limited activity within patient's tolerance, Monitored during session, Repositioned (Pt reports being itchy throughout her body)    Home Living Family/patient expects to be discharged to:: Skilled nursing facility                   Additional Comments: Pt reports she has been at [facility]" ([facility])    Prior Function Prior Level of  Function : Needs assist       Physical Assist : Mobility (physical);ADLs (physical) Mobility (physical): Transfers ADLs (physical): Dressing;Bathing;IADLs Mobility Comments: Pt reports she has been non-ambulatory; using hoyer lift transfers to manual w/c; pt self propels manual w/c with B UE's and R LE ADLs Comments: Per pt report pt requires assist for UB/LB dressing, bathing, transfer to toilet (however reports she has been wearing a brief, pt has a chronic foley), SET UP for grooming and feeding tasks     Hand Dominance        Extremity/Trunk Assessment   Upper Extremity Assessment Upper Extremity Assessment: Generalized weakness    Lower Extremity Assessment Lower Extremity Assessment: LLE deficits/detail;Generalized weakness LLE Deficits / Details: L BKA 01/2022; L knee extension WFL but limited L knee flexion noted    Cervical / Trunk Assessment Cervical / Trunk Assessment: Other exceptions Cervical / Trunk Exceptions: forward head/shoulders  Communication   Communication: No difficulties  Cognition Arousal/Alertness: Awake/alert Behavior During Therapy: Flat affect Overall Cognitive Status: No family/caregiver present to determine baseline cognitive functioning Area of Impairment: Orientation, Attention, Memory, Following commands, Safety/judgement, Awareness                 Orientation Level: Disoriented to, Situation Current Attention Level: Sustained Memory: Decreased short-term memory Following Commands: Follows one step commands with increased time Safety/Judgement: Decreased awareness of safety, Decreased awareness of deficits Awareness: Emergent   General Comments: Pt requires encouragement for participation        General Comments General comments (skin integrity, edema, etc.): rash noted (nurse aware)    Exercises     Assessment/Plan    PT Assessment Patient needs continued PT services  PT Problem List Decreased strength;Decreased range  of motion;Decreased activity tolerance;Decreased balance;Decreased mobility;Decreased cognition;Decreased knowledge of use of DME;Decreased knowledge of precautions;Decreased skin integrity;Pain       PT Treatment Interventions DME instruction;Functional mobility training;Therapeutic activities;Therapeutic exercise;Balance training;Patient/family education    PT Goals (Current goals can be found in the Care Plan section)  Acute Rehab PT Goals Patient Stated Goal: to improve strength and mobility PT Goal Formulation: With patient Time For Goal Achievement: 11/14/22 Potential to Achieve Goals: Fair    Frequency Min 2X/week     Co-evaluation PT/OT/SLP Co-Evaluation/Treatment: Yes Reason for Co-Treatment: Complexity of the patient's impairments (multi-system involvement);Necessary to address cognition/behavior during functional activity;For patient/therapist safety PT goals addressed during session: Mobility/safety with mobility;Balance OT goals addressed during session: ADL's and self-care       AM-PAC PT "6 Clicks" Mobility  Outcome Measure Help needed turning from your back to your side while in a flat bed without using bedrails?: Total Help needed moving from lying on your back to sitting on the side of a flat bed without using bedrails?: Total Help needed moving to and from a bed to a chair (including a wheelchair)?: Total Help needed standing up from a chair using your arms (e.g., wheelchair or bedside chair)?: Total Help needed to walk in hospital room?: Total Help needed climbing 3-5  steps with a railing? : Total 6 Click Score: 6    End of Session   Activity Tolerance: Patient limited by fatigue Patient left: in bed;with call bell/phone within reach;with bed alarm set;Other (comment) (R heel floating via pillow support) Nurse Communication: Mobility status;Precautions PT Visit Diagnosis: Other abnormalities of gait and mobility (R26.89);Muscle weakness (generalized)  (M62.81);History of falling (Z91.81);Pain    Time: 0981-1914 PT Time Calculation (min) (ACUTE ONLY): 18 min   Charges:   PT Evaluation $PT Eval Low Complexity: 1 Low   PT General Charges $$ ACUTE PT VISIT: 1 Visit        Hendricks Limes, PT 10/31/22, 4:55 PM

## 2022-10-31 NOTE — Progress Notes (Signed)
PROGRESS NOTE    [name]  YQM:[id number] DOB: 12-27-1950 DOA: 10/30/2022 PCP: [name], [name]  [id number]  LOS: 1 day   Brief hospital course:   Assessment & Plan: [name] is a 72 y.o. female with medical history significant of Multiple sclerosis, neurogenic bladder, stage III CKD, history of subdural hematoma status postcraniotomy, peripheral vascular disease, sleep apnea, hyperlipidemia, COPD exacerbation presenting with fall, encephalopathy, UTI.  Limited history in setting of encephalopathy.  Per report, patient had a fall at facility where staff was trying to ambulate patient from her wheelchair.  Upon reassessment, patient noted to be?  Febrile with worsening weakness per report.    Possible UTI (urinary tract infection) Noted chronic indwelling Foley with change in urine morphology, lower pelvic pain and weakness. Foley exchanged in ER. Urinalysis indicative of infection, however, urine cx neg growth, although it was obtained about an hour after first dose of cefepime. Noted urine culture September 2023 with Pseudomonas, and pt was started on cefepime on presentation. Plan: --cont cefepime for now  Fever --temp high of 103.2, seems too high for simple UTI.  Covid neg.  CXR neg.  Blood cx pending. --obtain RVP  Atonic neurogenic bladder Noted chronic indwelling Foley status post exchange in the ER in the setting of UTI  COPD (chronic obstructive pulmonary disease) (HCC) Acute COPD exacerbation with coughing, wheezing, increased sputum production Chest ray within normal limits No hypoxia present --started on steroid  Acute metabolic Encephalopathy --pt had intermittent episodes of lethargy. Blood gas ruled out hypercarbic narcosis CT head and MRI brain without acute finding. --mental status improved today --monitor  Pruritic rash --pt has small round pruritic rash scatter all over her truncal area, but not below her waist.  Less likely to be bug  bites since location restricted.  Possible contact dermatitis? --cont steroid --Benadryl PRN for itching --triamcinolone cream   Essential hypertension --hold home BP meds since BP intermittently low  Chronic kidney disease (CKD), stage IIIa  Appears near baseline  Multiple sclerosis (HCC) Baseline MS with neurogenic bladder CT head grossly stable  Major depressive disorder, recurrent, in partial remission (HCC) --resume home bupropion, Cymbalta  Anxiety --resume home Klonopin as BID PRN  Fall Positive attempted fall with nursing staff attempted to transfer a patient from wheelchair living facility CT head grossly stable PT OT evaluation  Hyperlipidemia --resume statin after discharge  Obstructive sleep apnea CPAP   DVT prophylaxis: Lovenox SQ Code Status: DNR  Family Communication:  Level of care: Progressive Dispo:   The patient is from: SNF Anticipated d/c is to: SNF Anticipated d/c date is: 2-3 days   Subjective and Interval History:  Pt complained of severe itching from rash in her truncal area.  No dyspnea.   Objective: Vitals:   10/31/22 0700 10/31/22 0900 10/31/22 1238 10/31/22 1500  BP: 130/65 122/63 134/82 94/82  Pulse: 63 62 68 79  Resp: 17 11 18 15   Temp: 97.6 F (36.4 C)  98.2 F (36.8 C) 97.6 F (36.4 C)  TempSrc: Oral   Oral  SpO2: 99% 100% 99% 97%  Weight:      Height:        Intake/Output Summary (Last 24 hours) at 10/31/2022 1802 Last data filed at 10/31/2022 1427 Gross per 24 hour  Intake 410.87 ml  Output 700 ml  Net -289.13 ml   Filed Weights   10/30/22 1209  Weight: 125.2 kg    Examination:   Constitutional: NAD, AAOx3 HEENT: conjunctivae and  lids normal, EOMI CV: No cyanosis.   RESP: normal respiratory effort, on RA SKIN: warm, dry.  Numerous circular rash scattered over truncal area Neuro: II - XII grossly intact.   Psych: Normal mood and affect.  Appropriate judgement and reason    Data Reviewed: I have  personally reviewed labs and imaging studies  Time spent: 50 minutes  [name], [name] If 7PM-7AM, please contact night-coverage 10/31/2022, 6:02 PM

## 2022-11-01 DIAGNOSIS — G9340 Encephalopathy, unspecified: Secondary | ICD-10-CM | POA: Diagnosis not present

## 2022-11-01 LAB — BASIC METABOLIC PANEL
Anion gap: 11 (ref 5–15)
BUN: 28 mg/dL — ABNORMAL HIGH (ref 8–23)
CO2: 22 mmol/L (ref 22–32)
Calcium: 9.3 mg/dL (ref 8.9–10.3)
Chloride: 104 mmol/L (ref 98–111)
Creatinine, Ser: 1.01 mg/dL — ABNORMAL HIGH (ref 0.44–1.00)
GFR, Estimated: 60 mL/min — ABNORMAL LOW (ref >60)
Glucose, Bld: 138 mg/dL — ABNORMAL HIGH (ref 70–99)
Potassium: 3.4 mmol/L — ABNORMAL LOW (ref 3.5–5.1)
Sodium: 137 mmol/L (ref 135–145)

## 2022-11-01 LAB — GLUCOSE, CAPILLARY
Glucose-Capillary: 119 mg/dL — ABNORMAL HIGH (ref 70–99)
Glucose-Capillary: 123 mg/dL — ABNORMAL HIGH (ref 70–99)
Glucose-Capillary: 129 mg/dL — ABNORMAL HIGH (ref 70–99)
Glucose-Capillary: 154 mg/dL — ABNORMAL HIGH (ref 70–99)
Glucose-Capillary: 197 mg/dL — ABNORMAL HIGH (ref 70–99)
Glucose-Capillary: 92 mg/dL (ref 70–99)

## 2022-11-01 LAB — CBC
HCT: 38.8 % (ref 36.0–46.0)
Hemoglobin: 12.8 g/dL (ref 12.0–15.0)
MCH: 26.6 pg (ref 26.0–34.0)
MCHC: 33 g/dL (ref 30.0–36.0)
MCV: 80.5 fL (ref 80.0–100.0)
Platelets: 282 10*3/uL (ref 150–400)
RBC: 4.82 MIL/uL (ref 3.87–5.11)
RDW: 14.6 % (ref 11.5–15.5)
WBC: 14.6 10*3/uL — ABNORMAL HIGH (ref 4.0–10.5)
nRBC: 0 % (ref 0.0–0.2)

## 2022-11-01 LAB — MAGNESIUM: Magnesium: 2.4 mg/dL (ref 1.7–2.4)

## 2022-11-01 MED ORDER — AMLODIPINE BESYLATE 5 MG PO TABS
5.0000 mg | ORAL_TABLET | Freq: Every day | ORAL | Status: DC
  Administered 2022-11-01 – 2022-11-02 (×2): 5 mg via ORAL
  Filled 2022-11-01 (×2): qty 1

## 2022-11-01 MED ORDER — BUTALBITAL-APAP-CAFFEINE 50-325-40 MG PO TABS
2.0000 | ORAL_TABLET | Freq: Once | ORAL | Status: AC
  Administered 2022-11-01: 2 via ORAL
  Filled 2022-11-01: qty 2

## 2022-11-01 MED ORDER — POTASSIUM CHLORIDE CRYS ER 20 MEQ PO TBCR
40.0000 meq | EXTENDED_RELEASE_TABLET | Freq: Once | ORAL | Status: AC
  Administered 2022-11-01: 40 meq via ORAL
  Filled 2022-11-01: qty 2

## 2022-11-01 NOTE — Progress Notes (Signed)
Occupational Therapy Treatment Patient Details Name: [name] MRN: [id number] DOB: 1951-03-27 Today's Date: 11/01/2022   History of present illness Pt is a 72 y.o. female presenting to hospital 10/30/22 s/p fall (when transferring to her w/c) and concern for possible UTI.  Pt admitted with UTI, acute COPD exacerbation, atonic neurogenic bladder, CKD, and MS.  Rapid response called 7/3 d/t lethargy/AMS and pt transferred to PCU (new therapy orders received).  PMH includes diastolic CHF, COPD, CKD, PVD, OSA, morbid obesity, multiple sclerosis, neurogenic bladder, h/o SDH s/p craniotomy, ADHD, L BKA 02/01/22, back surgery, L ORIF femur fx 2021.   OT comments  [name] was seen for OT treatment on this date. Upon arrival to room pt reclined in bed, agreeable to tx. Pt requires MAX A rolling L+R for toileting and clean chux pad placed. Educated on DME recs if pt plans to d/c home vs returning to rehab. Pt making limited progress toward goals, will continue to follow POC. Discharge recommendation remains appropriate.     Recommendations for follow up therapy are one component of a multi-disciplinary discharge planning process, led by the attending physician.  Recommendations may be updated based on patient status, additional functional criteria and insurance authorization.    Assistance Recommended at Discharge Frequent or constant Supervision/Assistance  Patient can return home with the following  Two people to help with walking and/or transfers;A lot of help with bathing/dressing/bathroom;Direct supervision/assist for medications management;Direct supervision/assist for financial management   Equipment Recommendations  Hospital bed    Recommendations for Other Services      Precautions / Restrictions Precautions Precautions: Fall Precaution Comments: L BKA (02/01/22) Restrictions Weight Bearing Restrictions: No       Mobility Bed Mobility Overal bed mobility: Needs Assistance Bed  Mobility: Rolling Rolling: Max assist              Transfers                   General transfer comment: defered, hoyer baseline         ADL either performed or assessed with clinical judgement   ADL Overall ADL's : Needs assistance/impaired                                       General ADL Comments: MAX A simulated toileting bed level      Cognition Arousal/Alertness: Awake/alert Behavior During Therapy: WFL for tasks assessed/performed Overall Cognitive Status: No family/caregiver present to determine baseline cognitive functioning                                 General Comments: follows 1 step commands with increased time                   Pertinent Vitals/ Pain       Pain Assessment Pain Assessment: 0-10 Pain Score: 8  Pain Location: headache Pain Descriptors / Indicators: Headache Pain Intervention(s): Limited activity within patient's tolerance, Patient requesting pain meds-RN notified   Frequency  Min 1X/week        Progress Toward Goals  OT Goals(current goals can now be found in the care plan section)  Progress towards OT goals: Progressing toward goals  Acute Rehab OT Goals Patient Stated Goal: to go home OT Goal Formulation: With patient Time For Goal Achievement: 11/14/22 Potential to Achieve  Goals: Poor ADL Goals Pt Will Perform Grooming: with min assist;sitting Pt Will Perform Lower Body Dressing: with mod assist Pt Will Transfer to Toilet: with mod assist;squat pivot transfer Pt Will Perform Toileting - Clothing Manipulation and hygiene: with mod assist;sitting/lateral leans  Plan Discharge plan remains appropriate;Frequency remains appropriate    Co-evaluation                 AM-PAC OT "6 Clicks" Daily Activity     Outcome Measure   Help from another person eating meals?: None Help from another person taking care of personal grooming?: A Little Help from another person  toileting, which includes using toliet, bedpan, or urinal?: Total Help from another person bathing (including washing, rinsing, drying)?: Total Help from another person to put on and taking off regular upper body clothing?: A Lot Help from another person to put on and taking off regular lower body clothing?: Total 6 Click Score: 12    End of Session    OT Visit Diagnosis: Other abnormalities of gait and mobility (R26.89);Muscle weakness (generalized) (M62.81)   Activity Tolerance Patient tolerated treatment well   Patient Left in bed;with call bell/phone within reach;with bed alarm set   Nurse Communication Patient requests pain meds        Time: 1610-9604 OT Time Calculation (min): 11 min  Charges: OT General Charges $OT Visit: 1 Visit OT Treatments $Self Care/Home Management : 8-22 mins  Kathie Dike, M.S. OTR/L  11/01/22, 11:57 AM  ascom [phone]

## 2022-11-01 NOTE — Progress Notes (Signed)
CHG wipe order discontinued per Dr. Tama Gander order.

## 2022-11-01 NOTE — Care Management Important Message (Signed)
Important Message  Patient Details  Name: [name] MRN: [id number] Date of Birth: 08/14/1950   Medicare Important Message Given:  N/A - LOS <3 / Initial given by admissions     Johnell Comings 11/01/2022, 8:37 AM

## 2022-11-01 NOTE — TOC Initial Note (Signed)
Transition of Care [facility]) - Initial/Assessment Note    Patient Details  Name: [name] MRN: [id number] Date of Birth: Aug 11, 1950  [facility] of [facility] CM/SW Contact:    Truddie Hidden, RN Phone Number: 11/01/2022, 3:59 PM  Clinical Narrative:                 Patient is LTC at [facility]. Attempt to reach Lavonne Chick and Gena in admissions. No answer.  Message sent to Tammy regarding possible discharge for tomorrow.        Patient Goals and CMS Choice            Expected Discharge Plan and Services                                              Prior Living Arrangements/Services                       Activities of Daily Living Home Assistive Devices/Equipment: Wheelchair ADL Screening (condition at time of admission) Patient's cognitive ability adequate to safely complete daily activities?: Yes Is the patient deaf or have difficulty hearing?: No Does the patient have difficulty seeing, even when wearing glasses/contacts?: No Does the patient have difficulty concentrating, remembering, or making decisions?: Yes Patient able to express need for assistance with ADLs?: Yes Does the patient have difficulty dressing or bathing?: Yes Independently performs ADLs?: No Communication: Independent Dressing (OT): Needs assistance Is this a change from baseline?: Pre-admission baseline Grooming: Needs assistance Is this a change from baseline?: Pre-admission baseline Feeding: Independent Bathing: Needs assistance Is this a change from baseline?: Pre-admission baseline Toileting: Needs assistance Is this a change from baseline?: Pre-admission baseline In/Out Bed: Needs assistance Is this a change from baseline?: Pre-admission baseline Walks in Home: Needs assistance Is this a change from baseline?: Pre-admission baseline Does the patient have difficulty walking or climbing stairs?: Yes Weakness of Legs: Both Weakness of Arms/Hands:  None  Permission Sought/Granted                  Emotional Assessment              Admission diagnosis:  UTI (urinary tract infection) [N39.0] Urinary tract infection associated with indwelling urethral catheter, initial encounter (HCC) [W09.811B, N39.0] Patient Active Problem List   Diagnosis Date Noted   UTI (urinary tract infection) 10/30/2022   Encephalopathy 10/30/2022   Fall 10/30/2022   Atherosclerosis of native arteries of extremity with intermittent claudication (HCC) 10/13/2022   Acute renal failure (HCC) 01/27/2022   Acute on chronic diastolic CHF (congestive heart failure) (HCC) 01/27/2022   Hyperkalemia 01/27/2022   Septic shock (HCC) 01/23/2022   Foot ulcer (HCC) 01/10/2022   Ulcer of left heel (HCC)    Non healing left heel wound 12/22/2021   Femur fracture, left (HCC) 02/04/2020   Left leg weakness 01/26/2020   COPD (chronic obstructive pulmonary disease) (HCC) 01/01/2020   Foley catheter in place on admission 11/19/2019   Neuropathy    Hypokalemia    Hyperlipidemia    Chronic kidney disease (CKD), stage III (moderate) (HCC) 04/20/2019   Ovarian mass, left 01/27/2019   Depression 05/13/2018   Recurrent cellulitis of lower extremity 06/25/2017   Medication monitoring encounter 06/05/2017   Cellulitis of left lower leg 04/25/2017   Adjustment disorder with mixed disturbance of emotions and conduct 04/23/2017  Cystitis due to Pseudomonas 01/02/2017   Left ovarian cyst 12/09/2016   Abdominal aortic atherosclerosis (HCC) 11/11/2016   Obstructive sleep apnea 10/11/2016   Follicular lymphoma of intra-abdominal lymph nodes (HCC) 08/06/2016   Obesity, Class III, BMI 40-49.9 (morbid obesity) (HCC) 01/05/2016   Skin ulcer (HCC) 11/08/2015   Peripheral vascular disease of lower extremity with ulceration (HCC) 11/08/2015   CKD (chronic kidney disease) stage 3, GFR 30-59 ml/min (HCC) 11/08/2015   Constipation due to pain medication 01/31/2015   Obstructive  sleep apnea of adult 01/13/2015   Atonic neurogenic bladder 10/24/2014   COPD with bronchial hyperresponsiveness (HCC) 10/02/2013   Major depressive disorder, recurrent, in partial remission (HCC) 10/02/2013   Essential hypertension 10/02/2013   Multiple sclerosis (HCC) 10/02/2013   Absence of bladder continence 09/25/2012   Kyphoscoliosis and scoliosis 11/26/2011   PCP:  Lauro Regulus, MD Pharmacy:   [facility] - [place], [place] - [address] [place] [address] Phone: [phone] Fax: [phone]  [id number] [place], [place] - [address] AT [facility] [address] [place] [address] Phone: [phone] Fax: [phone]     Social Determinants of Health (SDOH) Social History: SDOH Screenings   Food Insecurity: Patient Declined (10/30/2022)  Housing: Patient Declined (10/30/2022)  Transportation Needs: Patient Declined (10/30/2022)  Utilities: Patient Declined (10/30/2022)  Alcohol Screen: Low Risk  (10/14/2017)  Tobacco Use: Medium Risk (10/30/2022)   SDOH Interventions:     Readmission Risk Interventions    03/26/2021    4:21 PM  Readmission Risk Prevention Plan  Transportation Screening Complete  PCP or Specialist Appt within 3-5 Days Complete  Social Work Consult for Recovery Care Planning/Counseling Complete  Palliative Care Screening Complete  Medication Review Oceanographer) Complete

## 2022-11-01 NOTE — Progress Notes (Signed)
PROGRESS NOTE    [name]  UYQ:[id number] DOB: Jun 04, 1950 DOA: 10/30/2022 PCP: Lauro Regulus, MD  [id number]  LOS: 2 days   Brief hospital course:   Assessment & Plan: [name] is a 72 y.o. female with medical history significant of Multiple sclerosis, neurogenic bladder, stage III CKD, history of subdural hematoma status postcraniotomy, peripheral vascular disease, sleep apnea, hyperlipidemia, COPD exacerbation presenting with fall, encephalopathy, UTI.  Limited history in setting of encephalopathy.  Per report, patient had a fall at facility where staff was trying to ambulate patient from her wheelchair.  Upon reassessment, patient noted to be?  Febrile with worsening weakness per report.    Possible UTI (urinary tract infection) Noted chronic indwelling Foley with change in urine morphology, lower pelvic pain and weakness. Foley exchanged in ER. Urinalysis indicative of infection, however, urine cx neg growth, although it was obtained about an hour after first dose of cefepime. Noted urine culture September 2023 with Pseudomonas, and pt was started on cefepime on presentation. Plan: --empiric tx with cefepime for 3 days  Fever --temp high of 103.2.  Covid neg.  CXR neg.  Blood cx pending.  RVP neg.  Atonic neurogenic bladder Noted chronic indwelling Foley status post exchange in the ER in the setting of UTI  COPD (chronic obstructive pulmonary disease) (HCC) --admit physician noted coughing, wheezing, increased sputum production, which resolved by next day. Chest ray within normal limits No hypoxia present --started on steroid  Acute metabolic Encephalopathy --pt had intermittent episodes of lethargy. Blood gas ruled out hypercarbic narcosis CT head and MRI brain without acute finding. --mental status improved next day. --monitor  Pruritic rash --pt has small round pruritic rash scatter all over her truncal area, but not below her waist.  Less likely to  be bug bites since location restricted.  Possible contact dermatitis. --cont steroid --Benadryl PRN for itching --triamcinolone cream   Essential hypertension --resume home amlodipine  Chronic kidney disease (CKD), stage IIIa  Appears near baseline  Multiple sclerosis (HCC) Baseline MS with neurogenic bladder CT head grossly stable  Major depressive disorder, recurrent, in partial remission (HCC) --cont home bupropion, Cymbalta  Anxiety --cont home Klonopin as BID PRN  Fall Positive attempted fall with nursing staff attempted to transfer a patient from wheelchair living facility CT head grossly stable PT OT evaluation  Hyperlipidemia --resume statin after discharge  Obstructive sleep apnea CPAP   DVT prophylaxis: Lovenox SQ Code Status: DNR  Family Communication: husband updated at bedside today Level of care: Progressive Dispo:   The patient is from: SNF Anticipated d/c is to: SNF Anticipated d/c date is: tomorrow   Subjective and Interval History:  Pt still has itching.   Objective: Vitals:   11/01/22 0748 11/01/22 0802 11/01/22 1135 11/01/22 1300  BP:  (!) 161/70 (!) 155/75   Pulse: 64 63 66 72  Resp:  15 10 14   Temp:  97.9 F (36.6 C) 97.8 F (36.6 C)   TempSrc:  Oral Oral   SpO2: 100% 100% 100% 99%  Weight:      Height:        Intake/Output Summary (Last 24 hours) at 11/01/2022 1654 Last data filed at 11/01/2022 1037 Gross per 24 hour  Intake 700 ml  Output 600 ml  Net 100 ml   Filed Weights   10/30/22 1209 11/01/22 0247  Weight: 125.2 kg 123.7 kg    Examination:   Constitutional: NAD, AAOx3 HEENT: conjunctivae and lids normal, EOMI CV: No cyanosis.  RESP: normal respiratory effort, on RA SKIN: warm, dry.  Numerous scattered round lesions over upper body Neuro: II - XII grossly intact.   Psych: Normal mood and affect.  Appropriate judgement and reason   Data Reviewed: I have personally reviewed labs and imaging studies  Time  spent: 35 minutes  Darlin Priestly, MD [facility] If 7PM-7AM, please contact night-coverage 11/01/2022, 4:54 PM

## 2022-11-01 NOTE — Plan of Care (Signed)
  Problem: Clinical Measurements: Goal: Diagnostic test results will improve Outcome: Progressing Goal: Respiratory complications will improve Outcome: Progressing Goal: Cardiovascular complication will be avoided Outcome: Progressing   Problem: Activity: Goal: Risk for activity intolerance will decrease Outcome: Progressing   Problem: Nutrition: Goal: Adequate nutrition will be maintained Outcome: Progressing   Problem: Coping: Goal: Level of anxiety will decrease Outcome: Progressing   Problem: Elimination: Goal: Will not experience complications related to bowel motility Outcome: Progressing

## 2022-11-01 NOTE — Consult Note (Signed)
Pharmacy Antibiotic Note  [name] is a 72 y.o. female admitted on 10/30/2022 with UTI.  Pharmacy has been consulted for cefepime dosing. Tmax 100.4, WBC 11.7, UA: + nitrite, large leukocytes, WBC > 50. Hx of Ucx >=100,000 COLONIES/mL PSEUDOMONAS AERUGINOSA in 12/2021. Ucx not obtained.   Scr 1.38 >>1.01  Plan: Increase Cefepime from 2gm daily to 2gm q 12 hours for UTI w/o sepsis.   Height: 5\' 6"  (167.6 cm) Weight: 123.7 kg (272 lb 11.3 oz) IBW/kg (Calculated) : 59.3  Temp (24hrs), Avg:98 F (36.7 C), Min:97.6 F (36.4 C), Max:98.3 F (36.8 C)  Recent Labs  Lab 10/30/22 1200 10/30/22 1212 10/30/22 1446 10/30/22 1957 10/31/22 0247 11/01/22 0645  WBC 11.7*  --   --   --  7.3 14.6*  CREATININE 1.38*  --   --  1.27* 1.15* 1.01*  LATICACIDVEN  --  1.7 1.5  --   --   --      Estimated Creatinine Clearance: 68.6 mL/min (A) (by C-G formula based on SCr of 1.01 mg/dL (H)).    No Known Allergies  Antimicrobials this admission: 7/3 cefepime >>    Microbiology results: 7/3 BCx: NGTD  Thank you for allowing pharmacy to be a part of this patient's care.  Enza Shone Rodriguez-Guzman PharmD, BCPS 11/01/2022 2:16 PM

## 2022-11-02 DIAGNOSIS — Z741 Need for assistance with personal care: Secondary | ICD-10-CM | POA: Diagnosis not present

## 2022-11-02 DIAGNOSIS — W19XXXA Unspecified fall, initial encounter: Secondary | ICD-10-CM | POA: Diagnosis not present

## 2022-11-02 DIAGNOSIS — G35 Multiple sclerosis: Secondary | ICD-10-CM | POA: Diagnosis not present

## 2022-11-02 DIAGNOSIS — I10 Essential (primary) hypertension: Secondary | ICD-10-CM | POA: Diagnosis not present

## 2022-11-02 DIAGNOSIS — R41841 Cognitive communication deficit: Secondary | ICD-10-CM | POA: Diagnosis not present

## 2022-11-02 DIAGNOSIS — I739 Peripheral vascular disease, unspecified: Secondary | ICD-10-CM | POA: Diagnosis not present

## 2022-11-02 DIAGNOSIS — M6259 Muscle wasting and atrophy, not elsewhere classified, multiple sites: Secondary | ICD-10-CM | POA: Diagnosis not present

## 2022-11-02 DIAGNOSIS — J449 Chronic obstructive pulmonary disease, unspecified: Secondary | ICD-10-CM | POA: Diagnosis not present

## 2022-11-02 DIAGNOSIS — R21 Rash and other nonspecific skin eruption: Secondary | ICD-10-CM | POA: Diagnosis not present

## 2022-11-02 DIAGNOSIS — N139 Obstructive and reflux uropathy, unspecified: Secondary | ICD-10-CM | POA: Diagnosis not present

## 2022-11-02 DIAGNOSIS — Z7401 Bed confinement status: Secondary | ICD-10-CM | POA: Diagnosis not present

## 2022-11-02 DIAGNOSIS — Z4781 Encounter for orthopedic aftercare following surgical amputation: Secondary | ICD-10-CM | POA: Diagnosis not present

## 2022-11-02 DIAGNOSIS — G9340 Encephalopathy, unspecified: Secondary | ICD-10-CM | POA: Diagnosis not present

## 2022-11-02 DIAGNOSIS — L03116 Cellulitis of left lower limb: Secondary | ICD-10-CM | POA: Diagnosis not present

## 2022-11-02 LAB — BASIC METABOLIC PANEL
Anion gap: 10 (ref 5–15)
BUN: 31 mg/dL — ABNORMAL HIGH (ref 8–23)
CO2: 22 mmol/L (ref 22–32)
Calcium: 9.1 mg/dL (ref 8.9–10.3)
Chloride: 105 mmol/L (ref 98–111)
Creatinine, Ser: 1.18 mg/dL — ABNORMAL HIGH (ref 0.44–1.00)
GFR, Estimated: 49 mL/min — ABNORMAL LOW (ref >60)
Glucose, Bld: 83 mg/dL (ref 70–99)
Potassium: 3.5 mmol/L (ref 3.5–5.1)
Sodium: 137 mmol/L (ref 135–145)

## 2022-11-02 LAB — CBC
HCT: 39.1 % (ref 36.0–46.0)
Hemoglobin: 12.5 g/dL (ref 12.0–15.0)
MCH: 26.2 pg (ref 26.0–34.0)
MCHC: 32 g/dL (ref 30.0–36.0)
MCV: 82 fL (ref 80.0–100.0)
Platelets: 284 10*3/uL (ref 150–400)
RBC: 4.77 MIL/uL (ref 3.87–5.11)
RDW: 15 % (ref 11.5–15.5)
WBC: 10.3 10*3/uL (ref 4.0–10.5)
nRBC: 0 % (ref 0.0–0.2)

## 2022-11-02 LAB — BLOOD GAS, VENOUS
Acid-Base Excess: 2.8 mmol/L — ABNORMAL HIGH (ref 0.0–2.0)
Bicarbonate: 27.9 mmol/L (ref 20.0–28.0)
O2 Saturation: 47.5 %
Patient temperature: 37
pH, Ven: 7.41 (ref 7.25–7.43)

## 2022-11-02 LAB — GLUCOSE, CAPILLARY: Glucose-Capillary: 91 mg/dL (ref 70–99)

## 2022-11-02 LAB — MAGNESIUM: Magnesium: 2.2 mg/dL (ref 1.7–2.4)

## 2022-11-02 MED ORDER — QUETIAPINE FUMARATE ER 150 MG PO TB24
ORAL_TABLET | ORAL | 4 refills | Status: DC
Start: 2022-11-02 — End: 2023-06-10

## 2022-11-02 MED ORDER — TRIAMCINOLONE 0.1 % CREAM:EUCERIN CREAM 1:1: 1.0000 | TOPICAL_CREAM | Freq: Two times a day (BID) | CUTANEOUS | Status: DC

## 2022-11-02 MED ORDER — VORTIOXETINE HBR 5 MG PO TABS: ORAL_TABLET | ORAL | Status: AC

## 2022-11-02 MED ORDER — OXYCODONE HCL 5 MG PO TABS: 5.0000 mg | ORAL_TABLET | Freq: Three times a day (TID) | ORAL | 0 refills | Status: AC | PRN

## 2022-11-02 MED ORDER — CLONAZEPAM 0.5 MG PO TABS: 0.5000 mg | ORAL_TABLET | Freq: Two times a day (BID) | ORAL | 0 refills | Status: AC | PRN

## 2022-11-02 MED ORDER — TRAZODONE HCL 50 MG PO TABS: ORAL_TABLET | ORAL | Status: AC

## 2022-11-02 MED ORDER — DIPHENHYDRAMINE HCL 25 MG PO CAPS: 25.0000 mg | ORAL_CAPSULE | Freq: Four times a day (QID) | ORAL | 0 refills | Status: DC | PRN

## 2022-11-02 NOTE — TOC Transition Note (Signed)
Transition of Care [facility]) - CM/SW Discharge Note   Patient Details  Name: [name] MRN: [id number] Date of Birth: [birth year]  [facility] CM/SW Contact:  Kemper Durie, RN Phone Number: 11/02/2022, 10:04 AM   Clinical Narrative:     Patient discharging back to [facility], will go to room 208P.  Bedside nurse aware of room number and phone number to call report.  EMS paperwork printed, non-emergent EMS called for transport, patient is next on list.  Discharge summary sent to facility, Tammy at [facility] aware.   Final next level of care: Skilled Nursing Facility Barriers to Discharge: Barriers Resolved   Patient Goals and CMS Choice CMS Medicare.gov Compare Post Acute Care list provided to:: Patient    Discharge Placement                  Patient to be transferred to facility by: Non emergent [facility] Name of family member notified: Hildred Alamin Patient and family notified of of transfer: 11/02/22  Discharge Plan and Services Additional resources added to the After Visit Summary for                                       Social Determinants of Health (SDOH) Interventions SDOH Screenings   Food Insecurity: Patient Declined (10/30/2022)  Housing: Patient Declined (10/30/2022)  Transportation Needs: Patient Declined (10/30/2022)  Utilities: Patient Declined (10/30/2022)  Alcohol Screen: Low Risk  (10/14/2017)  Tobacco Use: Medium Risk (10/30/2022)     Readmission Risk Interventions    03/26/2021    4:21 PM  Readmission Risk Prevention Plan  Transportation Screening Complete  PCP or Specialist Appt within 3-5 Days Complete  Social Work Consult for Recovery Care Planning/Counseling Complete  Palliative Care Screening Complete  Medication Review [employer]) Complete

## 2022-11-02 NOTE — Discharge Summary (Signed)
Physician Discharge Summary   [name]  female DOB: 1950-05-23  ZOX:[id number]  PCP: Lauro Regulus, MD  Admit date: 10/30/2022 Discharge date: 11/02/2022  Admitted From: SNF LTC Disposition:  SNF LTC CODE STATUS: DNR   Hospital Course:  For full details, please see H&P, progress notes, consult notes and ancillary notes.  Briefly,  [name] is a 72 y.o. female with medical history significant of Multiple sclerosis, neurogenic bladder, stage III CKD, history of subdural hematoma status postcraniotomy, peripheral vascular disease, sleep apnea, COPD presenting from SNF with fall, encephalopathy.    Per report, patient had a fall at facility where staff was trying to ambulate patient from her wheelchair.  Pt noted to be lethargic on presentation and febrile.   Presumed UTI (urinary tract infection) Noted chronic indwelling Foley with change in urine morphology, lower pelvic pain and weakness. Foley exchanged in ER. Urinalysis indicative of infection, however, urine cx neg growth, although it was obtained about an hour after first dose of cefepime. Noted urine culture September 2023 with Pseudomonas, and pt was started on cefepime on presentation. --Pt received empiric tx with cefepime for 3 days   Fever, resolved --temp high of 103.2 on presentation.  Covid neg.  CXR neg.  Blood cx pending.  RVP neg.  No more fever since the day after presentation, presumed due to tx of UTI.   Atonic neurogenic bladder Noted chronic indwelling Foley status post exchange in the ER in the setting of UTI   COPD (chronic obstructive pulmonary disease) (HCC) --admit physician noted coughing, wheezing, increased sputum production, which resolved by next day. Chest ray within normal limits No hypoxia present --received steroid during hospitalization which is not continued after discharge.   Acute metabolic Encephalopathy --pt noted to be lethargic on the day of presentation.  Blood gas  ruled out hypercarbic narcosis CT head and MRI brain without acute finding.  Maybe due to UTI, however, facility med list has many sedating medication including oxycodone, scheduled clonazepam, gabapentin, Atarax, Seroquel, trazodone, all were held on presentation, to be resumed after discharge by facility provider in gradual manner as deemed appropriate. --mental status improved next day.   Pruritic rash --pt has small round pruritic rash scatter all over her truncal area, but not on her legs.  Less likely to be bug bites since location restricted.  Possible contact dermatitis.   --received steroid during hospitalization. --Benadryl PRN for itching --triamcinolone cream  --facility provider to look into possible hygiene products or laundry detergents that may have contributed to allergic reaction only in the upper body.   Essential hypertension --cont home amlodipine   Chronic kidney disease (CKD), stage IIIa  Appears near baseline   Multiple sclerosis (HCC) Baseline MS with neurogenic bladder CT head grossly stable   Anxiety and Depression --Facility med list included bupropion, Cymbalta and Trintellix, which consists of possible duplicate med.  --Home Klonopin is changed from BID scheduled to BID PRN.   Fall CT head grossly stable   Hyperlipidemia --resume statin after discharge   Obstructive sleep apnea CPAP   Unless noted above, medications under "STOP" list are ones pt was not taking PTA.  Discharge Diagnoses:  Active Problems:   UTI (urinary tract infection)   COPD (chronic obstructive pulmonary disease) (HCC)   Encephalopathy   Essential hypertension   Atonic neurogenic bladder   Chronic kidney disease (CKD), stage III (moderate) (HCC)   Major depressive disorder, recurrent, in partial remission (HCC)   Multiple sclerosis (HCC)  Obstructive sleep apnea   Hyperlipidemia   Fall   30 Day Unplanned Readmission Risk Score    Flowsheet Row ED to Hosp-Admission  (Current) from 10/30/2022 in [facility]  30 Day Unplanned Readmission Risk Score (%) 19.38 Filed at 11/02/2022 0800       This score is the patient's risk of an unplanned readmission within 30 days of being discharged (0 -100%). The score is based on dignosis, age, lab data, medications, orders, and past utilization.   Low:  0-14.9   Medium: 15-21.9   High: 22-29.9   Extreme: 30 and above         Discharge Instructions:  Allergies as of 11/02/2022   No Known Allergies      Medication List     STOP taking these medications    atorvastatin 10 MG tablet Commonly known as: LIPITOR   fluconazole 200 MG tablet Commonly known as: DIFLUCAN   hydrOXYzine 25 MG tablet Commonly known as: ATARAX   Myrbetriq 50 MG Tb24 tablet Generic drug: mirabegron ER   triamcinolone 0.025 % cream Commonly known as: KENALOG       TAKE these medications    acetaminophen 500 MG tablet Commonly known as: TYLENOL Take 1,000 mg by mouth every 8 (eight) hours as needed for moderate pain.   amLODipine 5 MG tablet Commonly known as: NORVASC Take 5 mg by mouth daily.   budesonide-formoterol 160-4.5 MCG/ACT inhaler Commonly known as: SYMBICORT Inhale 2 puffs into the lungs 2 (two) times daily.   buPROPion 300 MG 24 hr tablet Commonly known as: WELLBUTRIN XL Take 300 mg by mouth daily.   carboxymethylcellulose 0.5 % Soln Commonly known as: REFRESH PLUS 1 drop 3 (three) times daily as needed.   cholecalciferol 25 MCG (1000 UNIT) tablet Commonly known as: VITAMIN D3 Take 2,000 Units by mouth daily.   clonazePAM 0.5 MG tablet Commonly known as: KLONOPIN Take 1 tablet (0.5 mg total) by mouth 2 (two) times daily as needed for anxiety. Home med. What changed:  when to take this reasons to take this additional instructions   Cranberry 400 MG Caps Take 400 mg by mouth.   diphenhydrAMINE 25 mg capsule Commonly known as: BENADRYL Take 1 capsule (25 mg total) by  mouth every 6 (six) hours as needed for itching.   docusate sodium 100 MG capsule Commonly known as: COLACE Take 1 capsule (100 mg total) by mouth 2 (two) times daily as needed.   DULoxetine 60 MG capsule Commonly known as: CYMBALTA Take 1 capsule (60 mg total) by mouth every morning.   feeding supplement (PRO-STAT SUGAR FREE 64) Liqd Take 30 mLs by mouth 3 (three) times daily with meals.   gabapentin 600 MG tablet Commonly known as: NEURONTIN Take 600 mg by mouth 3 (three) times daily.   magnesium oxide 400 (240 Mg) MG tablet Commonly known as: MAG-OX Take 400 mg by mouth daily.   multivitamin with minerals Tabs tablet Take 1 tablet by mouth daily.   naloxone 4 MG/0.1ML Liqd nasal spray kit Commonly known as: NARCAN Place 1 spray into the nose.   oxyCODONE 5 MG immediate release tablet Commonly known as: Oxy IR/ROXICODONE Take 1 tablet (5 mg total) by mouth every 8 (eight) hours as needed for moderate pain or severe pain. Home med. What changed: additional instructions   QUEtiapine Fumarate 150 MG 24 hr tablet Commonly known as: SEROQUEL XR To be resumed by facility physician as deemed appropriate. What changed:  how much  to take how to take this when to take this additional instructions   torsemide 20 MG tablet Commonly known as: DEMADEX Take 1 tablet (20 mg total) by mouth daily as needed (edema).   traZODone 50 MG tablet Commonly known as: DESYREL To be resumed by facility physician as deemed appropriate. What changed:  how much to take how to take this when to take this additional instructions   triamcinolone 0.1 % cream : eucerin Crea Apply 1 Application topically 2 (two) times daily. To upper body rash.   vortioxetine HBr 5 MG Tabs tablet Commonly known as: TRINTELLIX Duplicate psych med.  To be resumed by facility physician as deemed appropriate. What changed:  how much to take how to take this when to take this additional instructions          Follow-up Information     Lauro Regulus, MD Follow up in 1 week(s).   Specialty: Internal Medicine Contact information: [address] [place] [address] [phone]                 No Known Allergies   The results of significant diagnostics from this hospitalization (including imaging, microbiology, ancillary and laboratory) are listed below for reference.   Consultations:   Procedures/Studies: MR BRAIN WO CONTRAST  Result Date: 10/31/2022 CLINICAL DATA:  Altered mental status EXAM: MRI HEAD WITHOUT CONTRAST TECHNIQUE: Multiplanar, multiecho pulse sequences of the brain and surrounding structures were obtained without intravenous contrast. COMPARISON:  None Available. FINDINGS: Brain: No acute infarct, mass effect or extra-axial collection. No acute or chronic hemorrhage. There is multifocal hyperintense T2-weighted signal within the white matter. Generalized volume loss. Old bilateral cerebellar infarcts. The midline structures are normal. Vascular: Major flow voids are preserved. Skull and upper cervical spine: Normal calvarium and skull base. Visualized upper cervical spine and soft tissues are normal. Sinuses/Orbits:No paranasal sinus fluid levels or advanced mucosal thickening. No mastoid or middle ear effusion. Normal orbits. IMPRESSION: 1. No acute intracranial abnormality. 2. Old bilateral cerebellar infarcts and findings of chronic small vessel ischemia. Electronically Signed   By: Deatra Robinson M.D.   On: 10/31/2022 01:07   CT HEAD WO CONTRAST ( )  Result Date: 10/30/2022 CLINICAL DATA:  Mental status change EXAM: CT HEAD WITHOUT CONTRAST TECHNIQUE: Contiguous axial images were obtained from the base of the skull through the vertex without intravenous contrast. RADIATION DOSE REDUCTION: This exam was performed according to the departmental dose-optimization program which includes automated exposure control, adjustment of the mA and/or kV  according to patient size and/or use of iterative reconstruction technique. COMPARISON:  CT brain 10/30/2022, 06/30/2021, MRI 07/01/2021 FINDINGS: Brain: No acute territorial infarction, hemorrhage or intracranial mass. Atrophy and fairly widespread white matter hypodensity corresponding to patient history of white matter disease. Chronic cerebellar infarcts. Stable ventricle size Vascular: No hyperdense vessels.  Carotid vascular calcification Skull: Normal. Negative for fracture or focal lesion. Sinuses/Orbits: No acute finding. Other: None IMPRESSION: 1. No CT evidence for acute intracranial abnormality. 2. Atrophy and white matter disease. Chronic cerebellar infarcts. Electronically Signed   By: Jasmine Pang M.D.   On: 10/30/2022 21:17   CT Head Wo Contrast  Result Date: 10/30/2022 CLINICAL DATA:  Fall from a wheelchair EXAM: CT HEAD WITHOUT CONTRAST TECHNIQUE: Contiguous axial images were obtained from the base of the skull through the vertex without intravenous contrast. RADIATION DOSE REDUCTION: This exam was performed according to the departmental dose-optimization program which includes automated exposure control, adjustment of the mA and/or kV  according to patient size and/or use of iterative reconstruction technique. COMPARISON:  06/30/2021 FINDINGS: Brain: No evidence of acute infarction, hemorrhage, mass, mass effect, or midline shift. No hydrocephalus or extra-axial fluid collection. Partial empty sella. Age related cerebral atrophy. Periventricular white matter changes, likely the sequela of chronic small vessel ischemic disease. Vascular: No hyperdense vessel. Skull: Negative for fracture or focal lesion. Sinuses/Orbits: Mucosal thickening in the ethmoid air cells, left maxillary sinus, and left greater than right sphenoid sinus. No acute finding in the orbits. Other: The mastoid air cells are well aerated. IMPRESSION: No acute intracranial process. Electronically Signed   By: Wiliam Ke M.D.    On: 10/30/2022 12:42   DG Chest Port 1 View  Result Date: 10/30/2022 CLINICAL DATA:  Questionable sepsis EXAM: PORTABLE CHEST 1 VIEW COMPARISON:  01/23/2022 FINDINGS: Rotated radiograph. No consolidation, pneumothorax or effusion. Stable cardiopericardial silhouette without edema. Overlapping cardiac leads. Film is under penetrated. IMPRESSION: Rotated under penetrated radiograph. No acute cardiopulmonary disease. Electronically Signed   By: Karen Kays M.D.   On: 10/30/2022 12:42   VAS [place] ABI WITH/WO TBI  Result Date: 10/11/2022  LOWER EXTREMITY DOPPLER STUDY Patient Name:  FRIMMY FAROOQUI  Date of Exam:   10/10/2022 Medical Rec #: [id number]      Accession #:    [id number] Date of Birth: [birth year]       Patient Gender: F Patient Age:   23 years Exam Location:  [facility] Procedure:      VAS [place] ABI WITH/WO TBI Referring Phys: Vivia Birmingham BROWN --------------------------------------------------------------------------------  High Risk Factors: Hypertension, hyperlipidemia, past history of smoking. Other Factors: BKA left. Recent wound to right shin.  Limitations: Today's exam was limited due to patient in wheelchair. Performing Technologist: Hardie Lora RVT  Examination Guidelines: A complete evaluation includes at minimum, Doppler waveform signals and systolic blood pressure reading at the level of bilateral brachial, anterior tibial, and posterior tibial arteries, when vessel segments are accessible. Bilateral testing is considered an integral part of a complete examination. Photoelectric Plethysmograph (PPG) waveforms and toe systolic pressure readings are included as required and additional duplex testing as needed. Limited examinations for reoccurring indications may be performed as noted.  ABI Findings: +---------+------------------+-----+---------+--------+ Right    Rt Pressure (mmHg)IndexWaveform Comment  +---------+------------------+-----+---------+--------+ Brachial 122                                       +---------+------------------+-----+---------+--------+ PTA      158               1.30 triphasic         +---------+------------------+-----+---------+--------+ DP       162               1.33 triphasic         +---------+------------------+-----+---------+--------+ Great Toe151               1.24                   +---------+------------------+-----+---------+--------+ +--------+------------------+-----+--------+-------+ Left    Lt Pressure (mmHg)IndexWaveformComment +--------+------------------+-----+--------+-------+ Brachial120                                    +--------+------------------+-----+--------+-------+ +-------+-----------+-----------+------------+------------+ ABI/TBIToday's ABIToday's TBIPrevious ABIPrevious TBI +-------+-----------+-----------+------------+------------+ Right  1.33       1.24                                +-------+-----------+-----------+------------+------------+  Arterial wall calcification precludes accurate ankle pressures and ABIs.  Summary: Right: Resting right ankle-brachial index indicates noncompressible right lower extremity arteries. The right toe-brachial index is normal. *See table(s) above for measurements and observations.  Electronically signed by Levora Dredge MD on 10/11/2022 at 9:55:21 AM.    Final       Labs: BNP (last 3 results) Recent Labs    01/23/22 0945  BNP 1,229.6*   Basic Metabolic Panel: Recent Labs  Lab 10/30/22 1200 10/30/22 1957 10/31/22 0247 11/01/22 0645 11/02/22 0451  NA 134* 131* 134* 137 137  K 3.9 3.8 3.6 3.4* 3.5  CL 99 101 102 104 105  CO2 25 20* 23 22 22   GLUCOSE 112* 182* 161* 138* 83  BUN 24* 25* 25* 28* 31*  CREATININE 1.38* 1.27* 1.15* 1.01* 1.18*  CALCIUM 8.9 8.3* 8.6* 9.3 9.1  MG  --   --   --  2.4 2.2   Liver Function Tests: Recent Labs  Lab 10/30/22 1200 10/31/22 0247  AST 19 16  ALT 13 15  ALKPHOS 88 76  BILITOT 0.9 0.6   PROT 7.8 6.7  ALBUMIN 3.9 3.1*   No results for input(s): "LIPASE", "AMYLASE" in the last 168 hours. No results for input(s): "AMMONIA" in the last 168 hours. CBC: Recent Labs  Lab 10/30/22 1200 10/31/22 0247 11/01/22 0645 11/02/22 0451  WBC 11.7* 7.3 14.6* 10.3  NEUTROABS 10.1*  --   --   --   HGB 13.0 11.8* 12.8 12.5  HCT 41.3 36.7 38.8 39.1  MCV 84.3 83.2 80.5 82.0  PLT 208 143* 282 284   Cardiac Enzymes: Recent Labs  Lab 10/30/22 1446  CKTOTAL 136   BNP: Invalid input(s): "POCBNP" CBG: Recent Labs  Lab 11/01/22 1138 11/01/22 1713 11/01/22 1924 11/01/22 2345 11/02/22 0412  GLUCAP 123* 119* 197* 92 91   D-Dimer No results for input(s): "DDIMER" in the last 72 hours. Hgb A1c No results for input(s): "HGBA1C" in the last 72 hours. Lipid Profile No results for input(s): "CHOL", "HDL", "LDLCALC", "TRIG", "CHOLHDL", "LDLDIRECT" in the last 72 hours. Thyroid function studies No results for input(s): "TSH", "T4TOTAL", "T3FREE", "THYROIDAB" in the last 72 hours.  Invalid input(s): "FREET3" Anemia work up No results for input(s): "VITAMINB12", "FOLATE", "FERRITIN", "TIBC", "IRON", "RETICCTPCT" in the last 72 hours. Urinalysis    Component Value Date/Time   COLORURINE YELLOW (A) 10/30/2022 1212   APPEARANCEUR CLOUDY (A) 10/30/2022 1212   APPEARANCEUR Hazy 08/02/2013 0020   LABSPEC 1.013 10/30/2022 1212   LABSPEC 1.012 08/02/2013 0020   PHURINE 6.0 10/30/2022 1212   GLUCOSEU NEGATIVE 10/30/2022 1212   GLUCOSEU Negative 08/02/2013 0020   HGBUR SMALL (A) 10/30/2022 1212   BILIRUBINUR NEGATIVE 10/30/2022 1212   BILIRUBINUR Negative 08/02/2013 0020   KETONESUR NEGATIVE 10/30/2022 1212   PROTEINUR 100 (A) 10/30/2022 1212   NITRITE POSITIVE (A) 10/30/2022 1212   LEUKOCYTESUR LARGE (A) 10/30/2022 1212   LEUKOCYTESUR 2+ 08/02/2013 0020   Sepsis Labs Recent Labs  Lab 10/30/22 1200 10/31/22 0247 11/01/22 0645 11/02/22 0451  WBC 11.7* 7.3 14.6* 10.3    Microbiology Recent Results (from the past 240 hour(s))  Blood Culture (routine x 2)     Status: None (Preliminary result)   Collection Time: 10/30/22 12:12 PM   Specimen: BLOOD  Result Value Ref Range Status   Specimen Description BLOOD LEFT ANTECUBITAL  Final   Special Requests   Final    BOTTLES DRAWN AEROBIC AND ANAEROBIC Blood Culture adequate volume  Culture   Final    NO GROWTH 3 DAYS Performed at [facility], [address] [place], [place] [address]    Report Status PENDING  Incomplete  Blood Culture (routine x 2)     Status: None (Preliminary result)   Collection Time: 10/30/22 12:12 PM   Specimen: BLOOD  Result Value Ref Range Status   Specimen Description BLOOD RIGHT ANTECUBITAL  Final   Special Requests   Final    BOTTLES DRAWN AEROBIC AND ANAEROBIC Blood Culture adequate volume   Culture   Final    NO GROWTH 3 DAYS Performed at [facility], [address], [place], [place] [address]    Report Status PENDING  Incomplete  Urine Culture     Status: None   Collection Time: 10/30/22  2:46 PM   Specimen: Urine, Clean Catch  Result Value Ref Range Status   Specimen Description   Final    URINE, CLEAN CATCH Performed at [facility], [address] [place], [place] [address]    Special Requests   Final    NONE Performed at [facility], [address], [place], [place] [address]    Culture   Final    NO GROWTH Performed at [facility], [address] [place], [place] [address]    Report Status 10/31/2022 FINAL  Final  SARS Coronavirus 2 by RT PCR (hospital order, performed in [facility] hospital lab) *cepheid single result test* Anterior Nasal Swab     Status: None   Collection Time: 10/31/22 11:30 AM   Specimen: Anterior Nasal Swab  Result Value Ref Range Status   SARS Coronavirus 2 by RT PCR NEGATIVE NEGATIVE Final    Comment: (NOTE) SARS-CoV-2 target nucleic acids are NOT DETECTED.  The SARS-CoV-2  RNA is generally detectable in upper and lower respiratory specimens during the acute phase of infection. The lowest concentration of SARS-CoV-2 viral copies this assay can detect is 250 copies / mL. A negative result does not preclude SARS-CoV-2 infection and should not be used as the sole basis for treatment or other patient management decisions.  A negative result may occur with improper specimen collection / handling, submission of specimen other than nasopharyngeal swab, presence of viral mutation(s) within the areas targeted by this assay, and inadequate number of viral copies (<250 copies / mL). A negative result must be combined with clinical observations, patient history, and epidemiological information.  Fact Sheet for Patients:   RoadLapTop.co.za  Fact Sheet for Healthcare Providers: http://kim-miller.com/  This test is not yet approved or  cleared by the [place] FDA and has been authorized for detection and/or diagnosis of SARS-CoV-2 by FDA under an Emergency Use Authorization (EUA).  This EUA will remain in effect (meaning this test can be used) for the duration of the COVID-19 declaration under Section 564(b)(1) of the Act, 21 U.S.C. section 360bbb-3(b)(1), unless the authorization is terminated or revoked sooner.  Performed at [facility], [address] [place], [place] [address]   Respiratory (~20 pathogens) panel by PCR     Status: None   Collection Time: 10/31/22 12:39 PM   Specimen: Nasopharyngeal Swab; Respiratory  Result Value Ref Range Status   Adenovirus NOT DETECTED NOT DETECTED Final   Coronavirus 229E NOT DETECTED NOT DETECTED Final    Comment: (NOTE) The Coronavirus on the Respiratory Panel, DOES NOT test for the novel  Coronavirus (2019 nCoV)    Coronavirus HKU1 NOT DETECTED NOT DETECTED Final  Coronavirus NL63 NOT DETECTED NOT DETECTED Final   Coronavirus OC43 NOT DETECTED NOT  DETECTED Final   Metapneumovirus NOT DETECTED NOT DETECTED Final   Rhinovirus / Enterovirus NOT DETECTED NOT DETECTED Final   Influenza A NOT DETECTED NOT DETECTED Final   Influenza B NOT DETECTED NOT DETECTED Final   Parainfluenza Virus 1 NOT DETECTED NOT DETECTED Final   Parainfluenza Virus 2 NOT DETECTED NOT DETECTED Final   Parainfluenza Virus 3 NOT DETECTED NOT DETECTED Final   Parainfluenza Virus 4 NOT DETECTED NOT DETECTED Final   Respiratory Syncytial Virus NOT DETECTED NOT DETECTED Final   Bordetella pertussis NOT DETECTED NOT DETECTED Final   Bordetella Parapertussis NOT DETECTED NOT DETECTED Final   Chlamydophila pneumoniae NOT DETECTED NOT DETECTED Final   Mycoplasma pneumoniae NOT DETECTED NOT DETECTED Final    Comment: Performed at [facility], [address] [place], [place] [address]     Total time spend on discharging this patient, including the last patient exam, discussing the hospital stay, instructions for ongoing care as it relates to all pertinent caregivers, as well as preparing the medical discharge records, prescriptions, and/or referrals as applicable, is 40 minutes.    Darlin Priestly, MD  [facility] 11/02/2022, 8:14 AM

## 2022-11-02 NOTE — [place] FL2 (Signed)
[place] MEDICAID FL2 LEVEL OF CARE FORM     IDENTIFICATION  Patient Name: [name] Birthdate: [birth year] Sex: female Admission Date (Current Location): 10/30/2022  [facility] and [place] Number:  Chiropodist and Address:  [facility], [address], [place], [place] [address]      Provider Number: [id number]  Attending Physician Name and Address:  Darlin Priestly, MD  Relative Name and Phone Number:  Keili, Sferra (Spouse) [phone] (Mobile)    Current Level of Care: Hospital Recommended Level of Care: Skilled Nursing Facility Prior Approval Number:    Date Approved/Denied:   PASRR Number: [id number]  Discharge Plan: SNF    Current Diagnoses: Patient Active Problem List   Diagnosis Date Noted   UTI (urinary tract infection) 10/30/2022   Encephalopathy 10/30/2022   Fall 10/30/2022   Atherosclerosis of native arteries of extremity with intermittent claudication (HCC) 10/13/2022   Acute renal failure (HCC) 01/27/2022   Acute on chronic diastolic CHF (congestive heart failure) (HCC) 01/27/2022   Hyperkalemia 01/27/2022   Septic shock (HCC) 01/23/2022   Foot ulcer (HCC) 01/10/2022   Ulcer of left heel (HCC)    Non healing left heel wound 12/22/2021   Femur fracture, left (HCC) 02/04/2020   Left leg weakness 01/26/2020   COPD (chronic obstructive pulmonary disease) (HCC) 01/01/2020   Foley catheter in place on admission 11/19/2019   Neuropathy    Hypokalemia    Hyperlipidemia    Chronic kidney disease (CKD), stage III (moderate) (HCC) 04/20/2019   Ovarian mass, left 01/27/2019   Depression 05/13/2018   Recurrent cellulitis of lower extremity 06/25/2017   Medication monitoring encounter 06/05/2017   Cellulitis of left lower leg 04/25/2017   Adjustment disorder with mixed disturbance of emotions and conduct 04/23/2017   Cystitis due to Pseudomonas 01/02/2017   Left ovarian cyst 12/09/2016   Abdominal aortic atherosclerosis (HCC)  11/11/2016   Obstructive sleep apnea 10/11/2016   Follicular lymphoma of intra-abdominal lymph nodes (HCC) 08/06/2016   Obesity, Class III, BMI 40-49.9 (morbid obesity) (HCC) 01/05/2016   Skin ulcer (HCC) 11/08/2015   Peripheral vascular disease of lower extremity with ulceration (HCC) 11/08/2015   CKD (chronic kidney disease) stage 3, GFR 30-59 ml/min (HCC) 11/08/2015   Constipation due to pain medication 01/31/2015   Obstructive sleep apnea of adult 01/13/2015   Atonic neurogenic bladder 10/24/2014   COPD with bronchial hyperresponsiveness (HCC) 10/02/2013   Major depressive disorder, recurrent, in partial remission (HCC) 10/02/2013   Essential hypertension 10/02/2013   Multiple sclerosis (HCC) 10/02/2013   Absence of bladder continence 09/25/2012   Kyphoscoliosis and scoliosis 11/26/2011    Orientation RESPIRATION BLADDER Height & Weight     Self, Time, Situation, Place  Normal Indwelling catheter Weight: 125.6 kg Height:  5\' 6"  (167.6 cm)  BEHAVIORAL SYMPTOMS/MOOD NEUROLOGICAL BOWEL NUTRITION STATUS      Incontinent Diet  AMBULATORY STATUS COMMUNICATION OF NEEDS Skin   Extensive Assist Verbally Normal                       Personal Care Assistance Level of Assistance  Bathing, Dressing Bathing Assistance: Maximum assistance   Dressing Assistance: Maximum assistance     Functional Limitations Info             SPECIAL CARE FACTORS FREQUENCY                       Contractures Contractures Info: Not present    Additional  Factors Info  Code Status, Allergies Code Status Info: DNR Allergies Info: NKA           Current Medications (11/02/2022):  This is the current hospital active medication list Current Facility-Administered Medications  Medication Dose Route Frequency Provider Last Rate Last Admin   acetaminophen (TYLENOL) tablet 650 mg  650 mg Oral Q6H PRN Floydene Flock, MD   650 mg at 11/01/22 0857   amLODipine (NORVASC) tablet 5 mg  5 mg  Oral Daily Darlin Priestly, MD   5 mg at 11/02/22 1610   buPROPion (WELLBUTRIN XL) 24 hr tablet 300 mg  300 mg Oral Daily Darlin Priestly, MD   300 mg at 11/02/22 0851   ceFEPIme (MAXIPIME) 2 g in sodium chloride 0.9 % 100 mL IVPB  2 g Intravenous Q12H Mila Merry A, RPH 200 mL/hr at 11/01/22 2137 2 g at 11/01/22 2137   clonazePAM (KLONOPIN) tablet 0.5 mg  0.5 mg Oral BID PRN Darlin Priestly, MD   0.5 mg at 11/01/22 2202   diphenhydrAMINE (BENADRYL) capsule 25 mg  25 mg Oral Q6H PRN Darlin Priestly, MD   25 mg at 11/01/22 1730   DULoxetine (CYMBALTA) DR capsule 60 mg  60 mg Oral Daily Darlin Priestly, MD   60 mg at 11/02/22 0851   enoxaparin (LOVENOX) injection 60 mg  60 mg Subcutaneous Q24H Floydene Flock, MD   60 mg at 11/01/22 1730   ipratropium-albuterol (DUONEB) 0.5-2.5 (3) MG/3ML nebulizer solution 3 mL  3 mL Nebulization Q4H PRN Floydene Flock, MD       ondansetron [facility]) tablet 4 mg  4 mg Oral Q6H PRN Floydene Flock, MD       Or   ondansetron [facility]) injection 4 mg  4 mg Intravenous Q6H PRN Floydene Flock, MD       predniSONE (DELTASONE) tablet 40 mg  40 mg Oral Q breakfast Floydene Flock, MD   40 mg at 11/02/22 9604   triamcinolone 0.1 % cream : eucerin cream, 1:1   Topical BID Darlin Priestly, MD   Given at 11/01/22 2138   Facility-Administered Medications Ordered in Other Encounters  Medication Dose Route Frequency Provider Last Rate Last Admin   heparin lock flush 100 unit/mL  500 Units Intracatheter Once PRN Creig Hines, MD         Discharge Medications: Please see discharge summary for a list of discharge medications.  Relevant Imaging Results:  Relevant Lab Results:   Additional Information SS# [id number]  Kemper Durie, RN

## 2022-11-02 NOTE — Progress Notes (Signed)
Reinserted larger bore foley catheter as last one was leaking. Pt tolerated very well. CNA Verlon Au at bedside to help me assist.  At 10:55 a.m. I called report to Tiffany, RN at [facility] and gave her report. Also told her that since I inserted foley catheter, pt still had no urine that came out so to keep an eye on it. She said she would.

## 2022-11-04 DIAGNOSIS — J449 Chronic obstructive pulmonary disease, unspecified: Secondary | ICD-10-CM | POA: Diagnosis not present

## 2022-11-04 DIAGNOSIS — R21 Rash and other nonspecific skin eruption: Secondary | ICD-10-CM | POA: Diagnosis not present

## 2022-11-04 LAB — CULTURE, BLOOD (ROUTINE X 2)
Culture: NO GROWTH
Culture: NO GROWTH
Special Requests: ADEQUATE
Special Requests: ADEQUATE

## 2022-11-07 DIAGNOSIS — N3000 Acute cystitis without hematuria: Secondary | ICD-10-CM | POA: Diagnosis not present

## 2022-11-07 DIAGNOSIS — I10 Essential (primary) hypertension: Secondary | ICD-10-CM | POA: Diagnosis not present

## 2022-11-08 DIAGNOSIS — D649 Anemia, unspecified: Secondary | ICD-10-CM | POA: Diagnosis not present

## 2022-11-12 DIAGNOSIS — N3000 Acute cystitis without hematuria: Secondary | ICD-10-CM | POA: Diagnosis not present

## 2022-11-12 DIAGNOSIS — K5909 Other constipation: Secondary | ICD-10-CM | POA: Diagnosis not present

## 2022-11-19 DIAGNOSIS — F331 Major depressive disorder, recurrent, moderate: Secondary | ICD-10-CM | POA: Diagnosis not present

## 2022-11-19 DIAGNOSIS — F4325 Adjustment disorder with mixed disturbance of emotions and conduct: Secondary | ICD-10-CM | POA: Diagnosis not present

## 2022-11-26 DIAGNOSIS — R21 Rash and other nonspecific skin eruption: Secondary | ICD-10-CM | POA: Diagnosis not present

## 2022-11-26 DIAGNOSIS — N3000 Acute cystitis without hematuria: Secondary | ICD-10-CM | POA: Diagnosis not present

## 2022-11-26 DIAGNOSIS — K5909 Other constipation: Secondary | ICD-10-CM | POA: Diagnosis not present

## 2022-11-27 DIAGNOSIS — I10 Essential (primary) hypertension: Secondary | ICD-10-CM | POA: Diagnosis not present

## 2022-11-27 DIAGNOSIS — N390 Urinary tract infection, site not specified: Secondary | ICD-10-CM | POA: Diagnosis not present

## 2022-11-29 DIAGNOSIS — N390 Urinary tract infection, site not specified: Secondary | ICD-10-CM | POA: Diagnosis not present

## 2022-12-09 DIAGNOSIS — N1831 Chronic kidney disease, stage 3a: Secondary | ICD-10-CM | POA: Diagnosis not present

## 2022-12-09 DIAGNOSIS — J449 Chronic obstructive pulmonary disease, unspecified: Secondary | ICD-10-CM | POA: Diagnosis not present

## 2022-12-09 DIAGNOSIS — R21 Rash and other nonspecific skin eruption: Secondary | ICD-10-CM | POA: Diagnosis not present

## 2022-12-09 DIAGNOSIS — I10 Essential (primary) hypertension: Secondary | ICD-10-CM | POA: Diagnosis not present

## 2022-12-10 DIAGNOSIS — F331 Major depressive disorder, recurrent, moderate: Secondary | ICD-10-CM | POA: Diagnosis not present

## 2022-12-10 DIAGNOSIS — F4325 Adjustment disorder with mixed disturbance of emotions and conduct: Secondary | ICD-10-CM | POA: Diagnosis not present

## 2022-12-16 DIAGNOSIS — S81812A Laceration without foreign body, left lower leg, initial encounter: Secondary | ICD-10-CM | POA: Diagnosis not present

## 2022-12-30 DIAGNOSIS — R052 Subacute cough: Secondary | ICD-10-CM | POA: Diagnosis not present

## 2022-12-30 DIAGNOSIS — R21 Rash and other nonspecific skin eruption: Secondary | ICD-10-CM | POA: Diagnosis not present

## 2022-12-30 DIAGNOSIS — R635 Abnormal weight gain: Secondary | ICD-10-CM | POA: Diagnosis not present

## 2023-01-02 DIAGNOSIS — R21 Rash and other nonspecific skin eruption: Secondary | ICD-10-CM | POA: Diagnosis not present

## 2023-01-02 DIAGNOSIS — J449 Chronic obstructive pulmonary disease, unspecified: Secondary | ICD-10-CM | POA: Diagnosis not present

## 2023-01-02 DIAGNOSIS — I10 Essential (primary) hypertension: Secondary | ICD-10-CM | POA: Diagnosis not present

## 2023-01-02 DIAGNOSIS — F32A Depression, unspecified: Secondary | ICD-10-CM | POA: Diagnosis not present

## 2023-01-02 DIAGNOSIS — E559 Vitamin D deficiency, unspecified: Secondary | ICD-10-CM | POA: Diagnosis not present

## 2023-01-14 DIAGNOSIS — R21 Rash and other nonspecific skin eruption: Secondary | ICD-10-CM | POA: Diagnosis not present

## 2023-01-14 DIAGNOSIS — D492 Neoplasm of unspecified behavior of bone, soft tissue, and skin: Secondary | ICD-10-CM | POA: Diagnosis not present

## 2023-01-14 DIAGNOSIS — Z79899 Other long term (current) drug therapy: Secondary | ICD-10-CM | POA: Diagnosis not present

## 2023-01-14 DIAGNOSIS — F4325 Adjustment disorder with mixed disturbance of emotions and conduct: Secondary | ICD-10-CM | POA: Diagnosis not present

## 2023-01-14 DIAGNOSIS — F331 Major depressive disorder, recurrent, moderate: Secondary | ICD-10-CM | POA: Diagnosis not present

## 2023-01-14 DIAGNOSIS — L2089 Other atopic dermatitis: Secondary | ICD-10-CM | POA: Diagnosis not present

## 2023-01-14 DIAGNOSIS — Z85828 Personal history of other malignant neoplasm of skin: Secondary | ICD-10-CM | POA: Diagnosis not present

## 2023-01-14 DIAGNOSIS — L281 Prurigo nodularis: Secondary | ICD-10-CM | POA: Diagnosis not present

## 2023-01-14 DIAGNOSIS — H6010 Cellulitis of external ear, unspecified ear: Secondary | ICD-10-CM | POA: Diagnosis not present

## 2023-01-20 DIAGNOSIS — R21 Rash and other nonspecific skin eruption: Secondary | ICD-10-CM | POA: Diagnosis not present

## 2023-01-20 DIAGNOSIS — H6010 Cellulitis of external ear, unspecified ear: Secondary | ICD-10-CM | POA: Diagnosis not present

## 2023-01-20 DIAGNOSIS — S31811A Laceration without foreign body of right buttock, initial encounter: Secondary | ICD-10-CM | POA: Diagnosis not present

## 2023-01-27 DIAGNOSIS — R293 Abnormal posture: Secondary | ICD-10-CM | POA: Diagnosis not present

## 2023-01-27 DIAGNOSIS — M6259 Muscle wasting and atrophy, not elsewhere classified, multiple sites: Secondary | ICD-10-CM | POA: Diagnosis not present

## 2023-01-27 DIAGNOSIS — G35 Multiple sclerosis: Secondary | ICD-10-CM | POA: Diagnosis not present

## 2023-01-27 DIAGNOSIS — Z89512 Acquired absence of left leg below knee: Secondary | ICD-10-CM | POA: Diagnosis not present

## 2023-01-27 DIAGNOSIS — G629 Polyneuropathy, unspecified: Secondary | ICD-10-CM | POA: Diagnosis not present

## 2023-01-27 DIAGNOSIS — Z741 Need for assistance with personal care: Secondary | ICD-10-CM | POA: Diagnosis not present

## 2023-01-27 DIAGNOSIS — F445 Conversion disorder with seizures or convulsions: Secondary | ICD-10-CM | POA: Diagnosis not present

## 2023-01-28 DIAGNOSIS — R293 Abnormal posture: Secondary | ICD-10-CM | POA: Diagnosis not present

## 2023-01-28 DIAGNOSIS — F064 Anxiety disorder due to known physiological condition: Secondary | ICD-10-CM | POA: Diagnosis not present

## 2023-01-28 DIAGNOSIS — G629 Polyneuropathy, unspecified: Secondary | ICD-10-CM | POA: Diagnosis not present

## 2023-01-28 DIAGNOSIS — M6259 Muscle wasting and atrophy, not elsewhere classified, multiple sites: Secondary | ICD-10-CM | POA: Diagnosis not present

## 2023-01-28 DIAGNOSIS — Z89512 Acquired absence of left leg below knee: Secondary | ICD-10-CM | POA: Diagnosis not present

## 2023-01-28 DIAGNOSIS — G35 Multiple sclerosis: Secondary | ICD-10-CM | POA: Diagnosis not present

## 2023-01-28 DIAGNOSIS — Z741 Need for assistance with personal care: Secondary | ICD-10-CM | POA: Diagnosis not present

## 2023-01-28 DIAGNOSIS — F445 Conversion disorder with seizures or convulsions: Secondary | ICD-10-CM | POA: Diagnosis not present

## 2023-01-29 DIAGNOSIS — F445 Conversion disorder with seizures or convulsions: Secondary | ICD-10-CM | POA: Diagnosis not present

## 2023-01-29 DIAGNOSIS — Z89512 Acquired absence of left leg below knee: Secondary | ICD-10-CM | POA: Diagnosis not present

## 2023-01-29 DIAGNOSIS — F064 Anxiety disorder due to known physiological condition: Secondary | ICD-10-CM | POA: Diagnosis not present

## 2023-01-29 DIAGNOSIS — G629 Polyneuropathy, unspecified: Secondary | ICD-10-CM | POA: Diagnosis not present

## 2023-01-29 DIAGNOSIS — Z741 Need for assistance with personal care: Secondary | ICD-10-CM | POA: Diagnosis not present

## 2023-01-29 DIAGNOSIS — M6259 Muscle wasting and atrophy, not elsewhere classified, multiple sites: Secondary | ICD-10-CM | POA: Diagnosis not present

## 2023-01-29 DIAGNOSIS — G35 Multiple sclerosis: Secondary | ICD-10-CM | POA: Diagnosis not present

## 2023-01-29 DIAGNOSIS — R293 Abnormal posture: Secondary | ICD-10-CM | POA: Diagnosis not present

## 2023-01-30 DIAGNOSIS — G629 Polyneuropathy, unspecified: Secondary | ICD-10-CM | POA: Diagnosis not present

## 2023-01-30 DIAGNOSIS — F064 Anxiety disorder due to known physiological condition: Secondary | ICD-10-CM | POA: Diagnosis not present

## 2023-01-30 DIAGNOSIS — R293 Abnormal posture: Secondary | ICD-10-CM | POA: Diagnosis not present

## 2023-01-30 DIAGNOSIS — Z89512 Acquired absence of left leg below knee: Secondary | ICD-10-CM | POA: Diagnosis not present

## 2023-01-30 DIAGNOSIS — F445 Conversion disorder with seizures or convulsions: Secondary | ICD-10-CM | POA: Diagnosis not present

## 2023-01-30 DIAGNOSIS — G35 Multiple sclerosis: Secondary | ICD-10-CM | POA: Diagnosis not present

## 2023-01-30 DIAGNOSIS — M6259 Muscle wasting and atrophy, not elsewhere classified, multiple sites: Secondary | ICD-10-CM | POA: Diagnosis not present

## 2023-01-30 DIAGNOSIS — Z741 Need for assistance with personal care: Secondary | ICD-10-CM | POA: Diagnosis not present

## 2023-01-31 DIAGNOSIS — Z89512 Acquired absence of left leg below knee: Secondary | ICD-10-CM | POA: Diagnosis not present

## 2023-01-31 DIAGNOSIS — Z741 Need for assistance with personal care: Secondary | ICD-10-CM | POA: Diagnosis not present

## 2023-01-31 DIAGNOSIS — F064 Anxiety disorder due to known physiological condition: Secondary | ICD-10-CM | POA: Diagnosis not present

## 2023-01-31 DIAGNOSIS — R293 Abnormal posture: Secondary | ICD-10-CM | POA: Diagnosis not present

## 2023-01-31 DIAGNOSIS — M6259 Muscle wasting and atrophy, not elsewhere classified, multiple sites: Secondary | ICD-10-CM | POA: Diagnosis not present

## 2023-01-31 DIAGNOSIS — G35 Multiple sclerosis: Secondary | ICD-10-CM | POA: Diagnosis not present

## 2023-01-31 DIAGNOSIS — G629 Polyneuropathy, unspecified: Secondary | ICD-10-CM | POA: Diagnosis not present

## 2023-01-31 DIAGNOSIS — F445 Conversion disorder with seizures or convulsions: Secondary | ICD-10-CM | POA: Diagnosis not present

## 2023-02-01 DIAGNOSIS — R293 Abnormal posture: Secondary | ICD-10-CM | POA: Diagnosis not present

## 2023-02-01 DIAGNOSIS — G629 Polyneuropathy, unspecified: Secondary | ICD-10-CM | POA: Diagnosis not present

## 2023-02-01 DIAGNOSIS — M6259 Muscle wasting and atrophy, not elsewhere classified, multiple sites: Secondary | ICD-10-CM | POA: Diagnosis not present

## 2023-02-01 DIAGNOSIS — Z741 Need for assistance with personal care: Secondary | ICD-10-CM | POA: Diagnosis not present

## 2023-02-01 DIAGNOSIS — Z89512 Acquired absence of left leg below knee: Secondary | ICD-10-CM | POA: Diagnosis not present

## 2023-02-01 DIAGNOSIS — G35 Multiple sclerosis: Secondary | ICD-10-CM | POA: Diagnosis not present

## 2023-02-01 DIAGNOSIS — F064 Anxiety disorder due to known physiological condition: Secondary | ICD-10-CM | POA: Diagnosis not present

## 2023-02-01 DIAGNOSIS — F445 Conversion disorder with seizures or convulsions: Secondary | ICD-10-CM | POA: Diagnosis not present

## 2023-02-03 DIAGNOSIS — G629 Polyneuropathy, unspecified: Secondary | ICD-10-CM | POA: Diagnosis not present

## 2023-02-03 DIAGNOSIS — Z89512 Acquired absence of left leg below knee: Secondary | ICD-10-CM | POA: Diagnosis not present

## 2023-02-03 DIAGNOSIS — F445 Conversion disorder with seizures or convulsions: Secondary | ICD-10-CM | POA: Diagnosis not present

## 2023-02-03 DIAGNOSIS — G35 Multiple sclerosis: Secondary | ICD-10-CM | POA: Diagnosis not present

## 2023-02-03 DIAGNOSIS — Z741 Need for assistance with personal care: Secondary | ICD-10-CM | POA: Diagnosis not present

## 2023-02-03 DIAGNOSIS — R293 Abnormal posture: Secondary | ICD-10-CM | POA: Diagnosis not present

## 2023-02-03 DIAGNOSIS — F064 Anxiety disorder due to known physiological condition: Secondary | ICD-10-CM | POA: Diagnosis not present

## 2023-02-03 DIAGNOSIS — M6259 Muscle wasting and atrophy, not elsewhere classified, multiple sites: Secondary | ICD-10-CM | POA: Diagnosis not present

## 2023-02-04 DIAGNOSIS — G629 Polyneuropathy, unspecified: Secondary | ICD-10-CM | POA: Diagnosis not present

## 2023-02-04 DIAGNOSIS — G35 Multiple sclerosis: Secondary | ICD-10-CM | POA: Diagnosis not present

## 2023-02-04 DIAGNOSIS — Z741 Need for assistance with personal care: Secondary | ICD-10-CM | POA: Diagnosis not present

## 2023-02-04 DIAGNOSIS — F445 Conversion disorder with seizures or convulsions: Secondary | ICD-10-CM | POA: Diagnosis not present

## 2023-02-04 DIAGNOSIS — F064 Anxiety disorder due to known physiological condition: Secondary | ICD-10-CM | POA: Diagnosis not present

## 2023-02-04 DIAGNOSIS — Z89512 Acquired absence of left leg below knee: Secondary | ICD-10-CM | POA: Diagnosis not present

## 2023-02-04 DIAGNOSIS — M6259 Muscle wasting and atrophy, not elsewhere classified, multiple sites: Secondary | ICD-10-CM | POA: Diagnosis not present

## 2023-02-04 DIAGNOSIS — R293 Abnormal posture: Secondary | ICD-10-CM | POA: Diagnosis not present

## 2023-02-05 DIAGNOSIS — Z89512 Acquired absence of left leg below knee: Secondary | ICD-10-CM | POA: Diagnosis not present

## 2023-02-05 DIAGNOSIS — F445 Conversion disorder with seizures or convulsions: Secondary | ICD-10-CM | POA: Diagnosis not present

## 2023-02-05 DIAGNOSIS — G35 Multiple sclerosis: Secondary | ICD-10-CM | POA: Diagnosis not present

## 2023-02-05 DIAGNOSIS — G629 Polyneuropathy, unspecified: Secondary | ICD-10-CM | POA: Diagnosis not present

## 2023-02-05 DIAGNOSIS — R293 Abnormal posture: Secondary | ICD-10-CM | POA: Diagnosis not present

## 2023-02-05 DIAGNOSIS — M6259 Muscle wasting and atrophy, not elsewhere classified, multiple sites: Secondary | ICD-10-CM | POA: Diagnosis not present

## 2023-02-05 DIAGNOSIS — Z741 Need for assistance with personal care: Secondary | ICD-10-CM | POA: Diagnosis not present

## 2023-02-05 DIAGNOSIS — F064 Anxiety disorder due to known physiological condition: Secondary | ICD-10-CM | POA: Diagnosis not present

## 2023-02-06 DIAGNOSIS — G629 Polyneuropathy, unspecified: Secondary | ICD-10-CM | POA: Diagnosis not present

## 2023-02-06 DIAGNOSIS — M6259 Muscle wasting and atrophy, not elsewhere classified, multiple sites: Secondary | ICD-10-CM | POA: Diagnosis not present

## 2023-02-06 DIAGNOSIS — R293 Abnormal posture: Secondary | ICD-10-CM | POA: Diagnosis not present

## 2023-02-06 DIAGNOSIS — Z741 Need for assistance with personal care: Secondary | ICD-10-CM | POA: Diagnosis not present

## 2023-02-06 DIAGNOSIS — Z89512 Acquired absence of left leg below knee: Secondary | ICD-10-CM | POA: Diagnosis not present

## 2023-02-06 DIAGNOSIS — F445 Conversion disorder with seizures or convulsions: Secondary | ICD-10-CM | POA: Diagnosis not present

## 2023-02-06 DIAGNOSIS — G35 Multiple sclerosis: Secondary | ICD-10-CM | POA: Diagnosis not present

## 2023-02-06 DIAGNOSIS — F064 Anxiety disorder due to known physiological condition: Secondary | ICD-10-CM | POA: Diagnosis not present

## 2023-02-07 DIAGNOSIS — R21 Rash and other nonspecific skin eruption: Secondary | ICD-10-CM | POA: Diagnosis not present

## 2023-02-07 DIAGNOSIS — G35 Multiple sclerosis: Secondary | ICD-10-CM | POA: Diagnosis not present

## 2023-02-07 DIAGNOSIS — Z741 Need for assistance with personal care: Secondary | ICD-10-CM | POA: Diagnosis not present

## 2023-02-07 DIAGNOSIS — Z89512 Acquired absence of left leg below knee: Secondary | ICD-10-CM | POA: Diagnosis not present

## 2023-02-07 DIAGNOSIS — I10 Essential (primary) hypertension: Secondary | ICD-10-CM | POA: Diagnosis not present

## 2023-02-07 DIAGNOSIS — F324 Major depressive disorder, single episode, in partial remission: Secondary | ICD-10-CM | POA: Diagnosis not present

## 2023-02-07 DIAGNOSIS — M6259 Muscle wasting and atrophy, not elsewhere classified, multiple sites: Secondary | ICD-10-CM | POA: Diagnosis not present

## 2023-02-07 DIAGNOSIS — F064 Anxiety disorder due to known physiological condition: Secondary | ICD-10-CM | POA: Diagnosis not present

## 2023-02-07 DIAGNOSIS — J449 Chronic obstructive pulmonary disease, unspecified: Secondary | ICD-10-CM | POA: Diagnosis not present

## 2023-02-07 DIAGNOSIS — F445 Conversion disorder with seizures or convulsions: Secondary | ICD-10-CM | POA: Diagnosis not present

## 2023-02-07 DIAGNOSIS — R293 Abnormal posture: Secondary | ICD-10-CM | POA: Diagnosis not present

## 2023-02-07 DIAGNOSIS — G629 Polyneuropathy, unspecified: Secondary | ICD-10-CM | POA: Diagnosis not present

## 2023-02-10 DIAGNOSIS — Z89512 Acquired absence of left leg below knee: Secondary | ICD-10-CM | POA: Diagnosis not present

## 2023-02-10 DIAGNOSIS — G629 Polyneuropathy, unspecified: Secondary | ICD-10-CM | POA: Diagnosis not present

## 2023-02-10 DIAGNOSIS — Z741 Need for assistance with personal care: Secondary | ICD-10-CM | POA: Diagnosis not present

## 2023-02-10 DIAGNOSIS — F064 Anxiety disorder due to known physiological condition: Secondary | ICD-10-CM | POA: Diagnosis not present

## 2023-02-10 DIAGNOSIS — G35 Multiple sclerosis: Secondary | ICD-10-CM | POA: Diagnosis not present

## 2023-02-10 DIAGNOSIS — F445 Conversion disorder with seizures or convulsions: Secondary | ICD-10-CM | POA: Diagnosis not present

## 2023-02-10 DIAGNOSIS — M6259 Muscle wasting and atrophy, not elsewhere classified, multiple sites: Secondary | ICD-10-CM | POA: Diagnosis not present

## 2023-02-10 DIAGNOSIS — R293 Abnormal posture: Secondary | ICD-10-CM | POA: Diagnosis not present

## 2023-02-11 DIAGNOSIS — L281 Prurigo nodularis: Secondary | ICD-10-CM | POA: Diagnosis not present

## 2023-02-11 DIAGNOSIS — B86 Scabies: Secondary | ICD-10-CM | POA: Diagnosis not present

## 2023-02-12 DIAGNOSIS — Z741 Need for assistance with personal care: Secondary | ICD-10-CM | POA: Diagnosis not present

## 2023-02-12 DIAGNOSIS — M6259 Muscle wasting and atrophy, not elsewhere classified, multiple sites: Secondary | ICD-10-CM | POA: Diagnosis not present

## 2023-02-12 DIAGNOSIS — G35 Multiple sclerosis: Secondary | ICD-10-CM | POA: Diagnosis not present

## 2023-02-12 DIAGNOSIS — Z89512 Acquired absence of left leg below knee: Secondary | ICD-10-CM | POA: Diagnosis not present

## 2023-02-12 DIAGNOSIS — F064 Anxiety disorder due to known physiological condition: Secondary | ICD-10-CM | POA: Diagnosis not present

## 2023-02-12 DIAGNOSIS — G629 Polyneuropathy, unspecified: Secondary | ICD-10-CM | POA: Diagnosis not present

## 2023-02-12 DIAGNOSIS — F445 Conversion disorder with seizures or convulsions: Secondary | ICD-10-CM | POA: Diagnosis not present

## 2023-02-12 DIAGNOSIS — R293 Abnormal posture: Secondary | ICD-10-CM | POA: Diagnosis not present

## 2023-02-13 DIAGNOSIS — G35 Multiple sclerosis: Secondary | ICD-10-CM | POA: Diagnosis not present

## 2023-02-13 DIAGNOSIS — R293 Abnormal posture: Secondary | ICD-10-CM | POA: Diagnosis not present

## 2023-02-13 DIAGNOSIS — G629 Polyneuropathy, unspecified: Secondary | ICD-10-CM | POA: Diagnosis not present

## 2023-02-13 DIAGNOSIS — Z741 Need for assistance with personal care: Secondary | ICD-10-CM | POA: Diagnosis not present

## 2023-02-13 DIAGNOSIS — Z89512 Acquired absence of left leg below knee: Secondary | ICD-10-CM | POA: Diagnosis not present

## 2023-02-13 DIAGNOSIS — M6259 Muscle wasting and atrophy, not elsewhere classified, multiple sites: Secondary | ICD-10-CM | POA: Diagnosis not present

## 2023-02-13 DIAGNOSIS — F445 Conversion disorder with seizures or convulsions: Secondary | ICD-10-CM | POA: Diagnosis not present

## 2023-02-13 DIAGNOSIS — F064 Anxiety disorder due to known physiological condition: Secondary | ICD-10-CM | POA: Diagnosis not present

## 2023-02-14 DIAGNOSIS — G35 Multiple sclerosis: Secondary | ICD-10-CM | POA: Diagnosis not present

## 2023-02-14 DIAGNOSIS — Z89512 Acquired absence of left leg below knee: Secondary | ICD-10-CM | POA: Diagnosis not present

## 2023-02-14 DIAGNOSIS — G629 Polyneuropathy, unspecified: Secondary | ICD-10-CM | POA: Diagnosis not present

## 2023-02-14 DIAGNOSIS — F064 Anxiety disorder due to known physiological condition: Secondary | ICD-10-CM | POA: Diagnosis not present

## 2023-02-14 DIAGNOSIS — M6259 Muscle wasting and atrophy, not elsewhere classified, multiple sites: Secondary | ICD-10-CM | POA: Diagnosis not present

## 2023-02-14 DIAGNOSIS — Z741 Need for assistance with personal care: Secondary | ICD-10-CM | POA: Diagnosis not present

## 2023-02-14 DIAGNOSIS — F445 Conversion disorder with seizures or convulsions: Secondary | ICD-10-CM | POA: Diagnosis not present

## 2023-02-14 DIAGNOSIS — R293 Abnormal posture: Secondary | ICD-10-CM | POA: Diagnosis not present

## 2023-02-15 DIAGNOSIS — F064 Anxiety disorder due to known physiological condition: Secondary | ICD-10-CM | POA: Diagnosis not present

## 2023-02-15 DIAGNOSIS — F445 Conversion disorder with seizures or convulsions: Secondary | ICD-10-CM | POA: Diagnosis not present

## 2023-02-15 DIAGNOSIS — M6259 Muscle wasting and atrophy, not elsewhere classified, multiple sites: Secondary | ICD-10-CM | POA: Diagnosis not present

## 2023-02-15 DIAGNOSIS — Z741 Need for assistance with personal care: Secondary | ICD-10-CM | POA: Diagnosis not present

## 2023-02-15 DIAGNOSIS — G35 Multiple sclerosis: Secondary | ICD-10-CM | POA: Diagnosis not present

## 2023-02-15 DIAGNOSIS — G629 Polyneuropathy, unspecified: Secondary | ICD-10-CM | POA: Diagnosis not present

## 2023-02-15 DIAGNOSIS — R293 Abnormal posture: Secondary | ICD-10-CM | POA: Diagnosis not present

## 2023-02-15 DIAGNOSIS — Z89512 Acquired absence of left leg below knee: Secondary | ICD-10-CM | POA: Diagnosis not present

## 2023-02-18 DIAGNOSIS — R293 Abnormal posture: Secondary | ICD-10-CM | POA: Diagnosis not present

## 2023-02-18 DIAGNOSIS — G629 Polyneuropathy, unspecified: Secondary | ICD-10-CM | POA: Diagnosis not present

## 2023-02-18 DIAGNOSIS — M6259 Muscle wasting and atrophy, not elsewhere classified, multiple sites: Secondary | ICD-10-CM | POA: Diagnosis not present

## 2023-02-18 DIAGNOSIS — F064 Anxiety disorder due to known physiological condition: Secondary | ICD-10-CM | POA: Diagnosis not present

## 2023-02-18 DIAGNOSIS — Z741 Need for assistance with personal care: Secondary | ICD-10-CM | POA: Diagnosis not present

## 2023-02-18 DIAGNOSIS — F331 Major depressive disorder, recurrent, moderate: Secondary | ICD-10-CM | POA: Diagnosis not present

## 2023-02-18 DIAGNOSIS — G35 Multiple sclerosis: Secondary | ICD-10-CM | POA: Diagnosis not present

## 2023-02-18 DIAGNOSIS — F4325 Adjustment disorder with mixed disturbance of emotions and conduct: Secondary | ICD-10-CM | POA: Diagnosis not present

## 2023-02-18 DIAGNOSIS — Z89512 Acquired absence of left leg below knee: Secondary | ICD-10-CM | POA: Diagnosis not present

## 2023-02-18 DIAGNOSIS — F445 Conversion disorder with seizures or convulsions: Secondary | ICD-10-CM | POA: Diagnosis not present

## 2023-02-19 DIAGNOSIS — Z89512 Acquired absence of left leg below knee: Secondary | ICD-10-CM | POA: Diagnosis not present

## 2023-02-19 DIAGNOSIS — M6259 Muscle wasting and atrophy, not elsewhere classified, multiple sites: Secondary | ICD-10-CM | POA: Diagnosis not present

## 2023-02-19 DIAGNOSIS — G629 Polyneuropathy, unspecified: Secondary | ICD-10-CM | POA: Diagnosis not present

## 2023-02-19 DIAGNOSIS — Z741 Need for assistance with personal care: Secondary | ICD-10-CM | POA: Diagnosis not present

## 2023-02-19 DIAGNOSIS — F445 Conversion disorder with seizures or convulsions: Secondary | ICD-10-CM | POA: Diagnosis not present

## 2023-02-19 DIAGNOSIS — R293 Abnormal posture: Secondary | ICD-10-CM | POA: Diagnosis not present

## 2023-02-19 DIAGNOSIS — F064 Anxiety disorder due to known physiological condition: Secondary | ICD-10-CM | POA: Diagnosis not present

## 2023-02-19 DIAGNOSIS — G35 Multiple sclerosis: Secondary | ICD-10-CM | POA: Diagnosis not present

## 2023-03-03 DIAGNOSIS — L02212 Cutaneous abscess of back [any part, except buttock]: Secondary | ICD-10-CM | POA: Diagnosis not present

## 2023-03-03 DIAGNOSIS — L02811 Cutaneous abscess of head [any part, except face]: Secondary | ICD-10-CM | POA: Diagnosis not present

## 2023-03-06 DIAGNOSIS — L0889 Other specified local infections of the skin and subcutaneous tissue: Secondary | ICD-10-CM | POA: Diagnosis not present

## 2023-03-06 DIAGNOSIS — L98492 Non-pressure chronic ulcer of skin of other sites with fat layer exposed: Secondary | ICD-10-CM | POA: Diagnosis not present

## 2023-03-06 DIAGNOSIS — D631 Anemia in chronic kidney disease: Secondary | ICD-10-CM | POA: Diagnosis not present

## 2023-03-06 DIAGNOSIS — M6281 Muscle weakness (generalized): Secondary | ICD-10-CM | POA: Diagnosis not present

## 2023-03-06 DIAGNOSIS — L02212 Cutaneous abscess of back [any part, except buttock]: Secondary | ICD-10-CM | POA: Diagnosis not present

## 2023-03-07 DIAGNOSIS — L0291 Cutaneous abscess, unspecified: Secondary | ICD-10-CM | POA: Diagnosis not present

## 2023-03-10 DIAGNOSIS — L02212 Cutaneous abscess of back [any part, except buttock]: Secondary | ICD-10-CM | POA: Diagnosis not present

## 2023-03-10 DIAGNOSIS — D631 Anemia in chronic kidney disease: Secondary | ICD-10-CM | POA: Diagnosis not present

## 2023-03-10 DIAGNOSIS — M6281 Muscle weakness (generalized): Secondary | ICD-10-CM | POA: Diagnosis not present

## 2023-03-11 DIAGNOSIS — L720 Epidermal cyst: Secondary | ICD-10-CM | POA: Diagnosis not present

## 2023-03-11 DIAGNOSIS — L281 Prurigo nodularis: Secondary | ICD-10-CM | POA: Diagnosis not present

## 2023-03-11 DIAGNOSIS — I10 Essential (primary) hypertension: Secondary | ICD-10-CM | POA: Diagnosis not present

## 2023-03-11 DIAGNOSIS — D492 Neoplasm of unspecified behavior of bone, soft tissue, and skin: Secondary | ICD-10-CM | POA: Diagnosis not present

## 2023-03-11 DIAGNOSIS — F064 Anxiety disorder due to known physiological condition: Secondary | ICD-10-CM | POA: Diagnosis not present

## 2023-03-11 DIAGNOSIS — B86 Scabies: Secondary | ICD-10-CM | POA: Diagnosis not present

## 2023-03-12 DIAGNOSIS — L603 Nail dystrophy: Secondary | ICD-10-CM | POA: Diagnosis not present

## 2023-03-12 DIAGNOSIS — L602 Onychogryphosis: Secondary | ICD-10-CM | POA: Diagnosis not present

## 2023-03-12 DIAGNOSIS — I739 Peripheral vascular disease, unspecified: Secondary | ICD-10-CM | POA: Diagnosis not present

## 2023-03-14 DIAGNOSIS — Z993 Dependence on wheelchair: Secondary | ICD-10-CM | POA: Diagnosis not present

## 2023-03-14 DIAGNOSIS — M950 Acquired deformity of nose: Secondary | ICD-10-CM | POA: Diagnosis not present

## 2023-03-14 DIAGNOSIS — Z66 Do not resuscitate: Secondary | ICD-10-CM | POA: Diagnosis not present

## 2023-03-14 DIAGNOSIS — C44311 Basal cell carcinoma of skin of nose: Secondary | ICD-10-CM | POA: Diagnosis not present

## 2023-03-14 DIAGNOSIS — Z89512 Acquired absence of left leg below knee: Secondary | ICD-10-CM | POA: Diagnosis not present

## 2023-03-14 DIAGNOSIS — I739 Peripheral vascular disease, unspecified: Secondary | ICD-10-CM | POA: Diagnosis not present

## 2023-03-14 DIAGNOSIS — G35 Multiple sclerosis: Secondary | ICD-10-CM | POA: Diagnosis not present

## 2023-03-18 DIAGNOSIS — R21 Rash and other nonspecific skin eruption: Secondary | ICD-10-CM | POA: Diagnosis not present

## 2023-03-20 DIAGNOSIS — L0889 Other specified local infections of the skin and subcutaneous tissue: Secondary | ICD-10-CM | POA: Diagnosis not present

## 2023-03-20 DIAGNOSIS — D631 Anemia in chronic kidney disease: Secondary | ICD-10-CM | POA: Diagnosis not present

## 2023-03-20 DIAGNOSIS — L98422 Non-pressure chronic ulcer of back with fat layer exposed: Secondary | ICD-10-CM | POA: Diagnosis not present

## 2023-03-20 DIAGNOSIS — M6281 Muscle weakness (generalized): Secondary | ICD-10-CM | POA: Diagnosis not present

## 2023-03-25 DIAGNOSIS — F4325 Adjustment disorder with mixed disturbance of emotions and conduct: Secondary | ICD-10-CM | POA: Diagnosis not present

## 2023-03-25 DIAGNOSIS — F331 Major depressive disorder, recurrent, moderate: Secondary | ICD-10-CM | POA: Diagnosis not present

## 2023-03-26 DIAGNOSIS — F064 Anxiety disorder due to known physiological condition: Secondary | ICD-10-CM | POA: Diagnosis not present

## 2023-03-26 DIAGNOSIS — L02811 Cutaneous abscess of head [any part, except face]: Secondary | ICD-10-CM | POA: Diagnosis not present

## 2023-03-26 DIAGNOSIS — L02212 Cutaneous abscess of back [any part, except buttock]: Secondary | ICD-10-CM | POA: Diagnosis not present

## 2023-04-01 DIAGNOSIS — J449 Chronic obstructive pulmonary disease, unspecified: Secondary | ICD-10-CM | POA: Diagnosis not present

## 2023-04-01 DIAGNOSIS — I10 Essential (primary) hypertension: Secondary | ICD-10-CM | POA: Diagnosis not present

## 2023-04-01 DIAGNOSIS — F324 Major depressive disorder, single episode, in partial remission: Secondary | ICD-10-CM | POA: Diagnosis not present

## 2023-04-01 DIAGNOSIS — R21 Rash and other nonspecific skin eruption: Secondary | ICD-10-CM | POA: Diagnosis not present

## 2023-04-03 DIAGNOSIS — M6281 Muscle weakness (generalized): Secondary | ICD-10-CM | POA: Diagnosis not present

## 2023-04-03 DIAGNOSIS — L98492 Non-pressure chronic ulcer of skin of other sites with fat layer exposed: Secondary | ICD-10-CM | POA: Diagnosis not present

## 2023-04-03 DIAGNOSIS — L0889 Other specified local infections of the skin and subcutaneous tissue: Secondary | ICD-10-CM | POA: Diagnosis not present

## 2023-04-03 DIAGNOSIS — D631 Anemia in chronic kidney disease: Secondary | ICD-10-CM | POA: Diagnosis not present

## 2023-04-03 DIAGNOSIS — L98422 Non-pressure chronic ulcer of back with fat layer exposed: Secondary | ICD-10-CM | POA: Diagnosis not present

## 2023-04-06 DIAGNOSIS — J9811 Atelectasis: Secondary | ICD-10-CM | POA: Diagnosis not present

## 2023-04-07 DIAGNOSIS — L0889 Other specified local infections of the skin and subcutaneous tissue: Secondary | ICD-10-CM | POA: Diagnosis not present

## 2023-04-07 DIAGNOSIS — J9601 Acute respiratory failure with hypoxia: Secondary | ICD-10-CM | POA: Diagnosis not present

## 2023-04-07 DIAGNOSIS — L02212 Cutaneous abscess of back [any part, except buttock]: Secondary | ICD-10-CM | POA: Diagnosis not present

## 2023-04-07 DIAGNOSIS — J449 Chronic obstructive pulmonary disease, unspecified: Secondary | ICD-10-CM | POA: Diagnosis not present

## 2023-04-07 DIAGNOSIS — N398 Other specified disorders of urinary system: Secondary | ICD-10-CM | POA: Diagnosis not present

## 2023-04-08 DIAGNOSIS — Z85828 Personal history of other malignant neoplasm of skin: Secondary | ICD-10-CM | POA: Diagnosis not present

## 2023-04-08 DIAGNOSIS — D492 Neoplasm of unspecified behavior of bone, soft tissue, and skin: Secondary | ICD-10-CM | POA: Diagnosis not present

## 2023-04-08 DIAGNOSIS — L281 Prurigo nodularis: Secondary | ICD-10-CM | POA: Diagnosis not present

## 2023-04-08 DIAGNOSIS — N390 Urinary tract infection, site not specified: Secondary | ICD-10-CM | POA: Diagnosis not present

## 2023-04-09 DIAGNOSIS — I10 Essential (primary) hypertension: Secondary | ICD-10-CM | POA: Diagnosis not present

## 2023-04-09 DIAGNOSIS — J449 Chronic obstructive pulmonary disease, unspecified: Secondary | ICD-10-CM | POA: Diagnosis not present

## 2023-04-09 DIAGNOSIS — G4733 Obstructive sleep apnea (adult) (pediatric): Secondary | ICD-10-CM | POA: Diagnosis not present

## 2023-04-09 DIAGNOSIS — R8279 Other abnormal findings on microbiological examination of urine: Secondary | ICD-10-CM | POA: Diagnosis not present

## 2023-04-09 NOTE — Progress Notes (Unsigned)
MRN : [id number]  [name] is a 72 y.o. (August 25, 1950) female who presents with chief complaint of check circulation.  History of Present Illness:   The patient returns to the office for followup and review of the noninvasive studies.    There have been no interval changes in lower extremity symptoms. No interval shortening of the patient's claudication distance or development of rest pain symptoms. No new ulcers or wounds have occurred since the last visit.   There have been no significant changes to the patient's overall health care.   The patient denies amaurosis fugax or recent TIA symptoms. There are no documented recent neurological changes noted. There is no history of DVT, PE or superficial thrombophlebitis. The patient denies recent episodes of angina or shortness of breath.    ABI Rt=1.33 and Lt=BKA      No outpatient medications have been marked as taking for the 04/10/23 encounter (Appointment) with Gilda Crease, Latina Craver, MD.    Past Medical History:  Diagnosis Date   Abdominal aortic atherosclerosis (HCC) 11/11/2016   ADHD    Anxiety    COPD (chronic obstructive pulmonary disease) (HCC)    Depression    major depressive   Dyspnea    doe   Edema    left leg   Follicular lymphoma (HCC)    B Cell   Follicular lymphoma grade II (HCC)    Hypertension    Hypotension    idiopathic   Kyphoscoliosis and scoliosis 11/26/2011   Morbid obesity (HCC) 01/05/2016   Multiple sclerosis (HCC)    Multiple sclerosis (HCC)    1980's   Neuromuscular disorder (HCC)    Obstructive and reflux uropathy    foley   Pain    atypical facial   Peripheral vascular disease of lower extremity with ulceration (HCC) 11/08/2015   Skin ulcer (HCC) 11/08/2015   Weakness    generalized. has MS    Past Surgical History:  Procedure Laterality Date   AMPUTATION Left 02/01/2022   Procedure: AMPUTATION BELOW KNEE;  Surgeon:  Annice Needy, MD;  Location: [facility] ORS;  Service: Vascular;  Laterality: Left;   BACK SURGERY N/A 2002   BONE BIOPSY Left 12/24/2021   Procedure: BONE BIOPSY-HEEL BONE;  Surgeon: Vivi Barrack, DPM;  Location: [facility] ORS;  Service: Podiatry;  Laterality: Left;   CYST EXCISION     lower back   INGUINAL LYMPH NODE BIOPSY Left 07/04/2016   Procedure: INGUINAL LYMPH NODE BIOPSY;  Surgeon: Kieth Brightly, MD;  Location: [facility] ORS;  Service: General;  Laterality: Left;   ORIF FEMUR FRACTURE Left 02/04/2020   Procedure: OPEN REDUCTION INTERNAL FIXATION (ORIF) DISTAL FEMUR FRACTURE;  Surgeon: Roby Lofts, MD;  Location: [facility] OR;  Service: Orthopedics;  Laterality: Left;   PORTACATH PLACEMENT N/A 07/22/2016   Procedure: INSERTION PORT-A-CATH;  Surgeon: Kieth Brightly, MD;  Location: [facility] ORS;  Service: General;  Laterality: N/A;   TONSILLECTOMY AND ADENOIDECTOMY     TUBAL LIGATION     WOUND DEBRIDEMENT Left 12/24/2021   Procedure: DEBRIDEMENT WOUND-HEEL ULCER;  Surgeon: Vivi Barrack,  DPM;  Location: [facility] ORS;  Service: Podiatry;  Laterality: Left;    Social History Social History   Tobacco Use   Smoking status: Former    Current packs/day: 0.00    Average packs/day: 1 pack/day for 20.8 years (20.8 ttl pk-yrs)    Types: Cigarettes    Start date: 04/30/1995    Quit date: 02/03/2016    Years since quitting: 7.1   Smokeless tobacco: Never  Vaping Use   Vaping status: Some Days  Substance Use Topics   Alcohol use: No    Alcohol/week: 0.0 standard drinks of alcohol   Drug use: Yes    Types: Marijuana    Comment: smokes THC occasionally per pt     Family History Family History  Problem Relation Age of Onset   COPD Mother    Diabetes Mother    Heart failure Mother    Alcohol abuse Father    Kidney disease Father    Kidney failure Father    Arthritis Sister    CAD Maternal Grandmother    Stroke Maternal Grandfather    Arthritis Sister    Mental illness Sister     Arthritis Brother     No Known Allergies   REVIEW OF SYSTEMS (Negative unless checked)  Constitutional: [] Weight loss  [] Fever  [] Chills Cardiac: [] Chest pain   [] Chest pressure   [] Palpitations   [] Shortness of breath when laying flat   [] Shortness of breath with exertion. Vascular:  [x] Pain in legs with walking   [] Pain in legs at rest  [] History of DVT   [] Phlebitis   [] Swelling in legs   [] Varicose veins   [] Non-healing ulcers Pulmonary:   [] Uses home oxygen   [] Productive cough   [] Hemoptysis   [] Wheeze  [x] COPD   [] Asthma Neurologic:  [] Dizziness   [] Seizures   [] History of stroke   [] History of TIA  [] Aphasia   [] Vissual changes   [] Weakness or numbness in arm   [x] Weakness or numbness in leg Musculoskeletal:   [] Joint swelling   [] Joint pain   [] Low back pain Hematologic:  [] Easy bruising  [] Easy bleeding   [] Hypercoagulable state   [] Anemic Gastrointestinal:  [] Diarrhea   [] Vomiting  [] Gastroesophageal reflux/heartburn   [] Difficulty swallowing. Genitourinary:  [x] Chronic kidney disease   [] Difficult urination  [] Frequent urination   [] Blood in urine Skin:  [] Rashes   [] Ulcers  Psychological:  [] History of anxiety   []  History of major depression.  Physical Examination  There were no vitals filed for this visit. There is no height or weight on file to calculate BMI. Gen: WD/WN, NAD Head: [place]/AT, No temporalis wasting.  Ear/Nose/Throat: Hearing grossly intact, nares w/o erythema or drainage Eyes: PER, EOMI, sclera nonicteric.  Neck: Supple, no masses.  No bruit or JVD.  Pulmonary:  Good air movement, no audible wheezing, no use of accessory muscles.  Cardiac: RRR, normal S1, S2, no Murmurs. Vascular:  mild trophic changes, no open wounds Vessel Right Left  Radial Palpable Palpable  PT Not Palpable BKA  DP Not Palpable BKA  Gastrointestinal: soft, non-distended. No guarding/no peritoneal signs.  Musculoskeletal: M/S 5/5 throughout.  No visible deformity.  Neurologic: CN  2-12 intact. Pain and light touch intact in extremities.  Symmetrical.  Speech is fluent. Motor exam as listed above. Psychiatric: Judgment intact, Mood & affect appropriate for pt's clinical situation. Dermatologic: No rashes or ulcers noted.  No changes consistent with cellulitis.   CBC Lab Results  Component Value Date   WBC 10.3 11/02/2022  HGB 12.5 11/02/2022   HCT 39.1 11/02/2022   MCV 82.0 11/02/2022   PLT 284 11/02/2022    BMET    Component Value Date/Time   NA 137 11/02/2022 0451   NA 125 (L) 08/01/2013 2312   K 3.5 11/02/2022 0451   K 3.5 08/01/2013 2312   CL 105 11/02/2022 0451   CL 90 (L) 08/01/2013 2312   CO2 22 11/02/2022 0451   CO2 29 08/01/2013 2312   GLUCOSE 83 11/02/2022 0451   GLUCOSE 98 08/01/2013 2312   BUN 31 (H) 11/02/2022 0451   BUN 15 08/01/2013 2312   CREATININE 1.18 (H) 11/02/2022 0451   CREATININE 1.14 (H) 02/02/2018 1613   CALCIUM 9.1 11/02/2022 0451   CALCIUM 8.9 08/01/2013 2312   GFRNONAA 49 (L) 11/02/2022 0451   GFRNONAA 50 (L) 02/02/2018 1613   GFRAA 56 (L) 02/01/2020 0313   GFRAA 58 (L) 02/02/2018 1613   CrCl cannot be calculated (Patient's most recent lab result is older than the maximum 21 days allowed.).  COAG Lab Results  Component Value Date   INR 1.3 (H) 01/23/2022   INR 1.2 01/17/2022   INR 1.0 01/26/2020    Radiology No results found.   Assessment/Plan There are no diagnoses linked to this encounter.   Levora Dredge, MD  04/09/2023 1:42 PM

## 2023-04-10 ENCOUNTER — Encounter (INDEPENDENT_AMBULATORY_CARE_PROVIDER_SITE_OTHER): Payer: Medicare HMO

## 2023-04-10 ENCOUNTER — Ambulatory Visit (INDEPENDENT_AMBULATORY_CARE_PROVIDER_SITE_OTHER): Payer: Medicare HMO | Admitting: Vascular Surgery

## 2023-04-10 DIAGNOSIS — D649 Anemia, unspecified: Secondary | ICD-10-CM | POA: Diagnosis not present

## 2023-04-10 DIAGNOSIS — L0889 Other specified local infections of the skin and subcutaneous tissue: Secondary | ICD-10-CM | POA: Diagnosis not present

## 2023-04-10 DIAGNOSIS — L98422 Non-pressure chronic ulcer of back with fat layer exposed: Secondary | ICD-10-CM | POA: Diagnosis not present

## 2023-04-10 DIAGNOSIS — M6281 Muscle weakness (generalized): Secondary | ICD-10-CM | POA: Diagnosis not present

## 2023-04-10 DIAGNOSIS — D631 Anemia in chronic kidney disease: Secondary | ICD-10-CM | POA: Diagnosis not present

## 2023-04-11 DIAGNOSIS — R8279 Other abnormal findings on microbiological examination of urine: Secondary | ICD-10-CM | POA: Diagnosis not present

## 2023-04-11 DIAGNOSIS — N1831 Chronic kidney disease, stage 3a: Secondary | ICD-10-CM | POA: Diagnosis not present

## 2023-04-11 DIAGNOSIS — J449 Chronic obstructive pulmonary disease, unspecified: Secondary | ICD-10-CM | POA: Diagnosis not present

## 2023-04-11 DIAGNOSIS — L02212 Cutaneous abscess of back [any part, except buttock]: Secondary | ICD-10-CM | POA: Diagnosis not present

## 2023-04-16 DIAGNOSIS — J449 Chronic obstructive pulmonary disease, unspecified: Secondary | ICD-10-CM | POA: Diagnosis not present

## 2023-04-16 DIAGNOSIS — L02212 Cutaneous abscess of back [any part, except buttock]: Secondary | ICD-10-CM | POA: Diagnosis not present

## 2023-04-17 DIAGNOSIS — F331 Major depressive disorder, recurrent, moderate: Secondary | ICD-10-CM | POA: Diagnosis not present

## 2023-04-17 DIAGNOSIS — F4325 Adjustment disorder with mixed disturbance of emotions and conduct: Secondary | ICD-10-CM | POA: Diagnosis not present

## 2023-04-25 DIAGNOSIS — F064 Anxiety disorder due to known physiological condition: Secondary | ICD-10-CM | POA: Diagnosis not present

## 2023-04-25 DIAGNOSIS — I10 Essential (primary) hypertension: Secondary | ICD-10-CM | POA: Diagnosis not present

## 2023-04-29 DIAGNOSIS — R41841 Cognitive communication deficit: Secondary | ICD-10-CM | POA: Diagnosis not present

## 2023-04-29 DIAGNOSIS — G35 Multiple sclerosis: Secondary | ICD-10-CM | POA: Diagnosis not present

## 2023-04-29 DIAGNOSIS — F339 Major depressive disorder, recurrent, unspecified: Secondary | ICD-10-CM | POA: Diagnosis not present

## 2023-04-29 DIAGNOSIS — F064 Anxiety disorder due to known physiological condition: Secondary | ICD-10-CM | POA: Diagnosis not present

## 2023-05-01 NOTE — Telephone Encounter (Signed)
 Error

## 2023-06-04 ENCOUNTER — Other Ambulatory Visit: Payer: Self-pay | Admitting: Otolaryngology

## 2023-06-12 ENCOUNTER — Other Ambulatory Visit: Payer: Self-pay

## 2023-06-12 ENCOUNTER — Encounter (HOSPITAL_COMMUNITY): Payer: Self-pay | Admitting: Otolaryngology

## 2023-06-12 NOTE — Pre-Procedure Instructions (Signed)
-------------    SDW INSTRUCTIONS given:  Your procedure is scheduled on 2/14.  Report to [facility] Main Entrance "A" at 09:00 A.M., and check in at the Admitting office.  Any questions or running late day of surgery: call [phone]    Remember:  Do not eat after midnight the night before your surgery  You may drink clear liquids until 0830 AM the morning of your surgery.   Clear liquids allowed are: Water, Non-Citrus Juices (without pulp), Carbonated Beverages, Clear Tea, Black Coffee Only, and Gatorade    Take these medicines the morning of surgery with A SIP OF WATER  Tylenol PRN albuterol PRN  Amlodipine  Symbicort Bupropion  Klonopin Doxyxycline  Duloxetine  Gabapentin  oxy IR PRN  trintellix   PLEASE SEND A PRINT-OUT OF THE MEDICATIONS SHE TAKES SHOWING THE LAST DOSE OF EACH MEDICATION  As of today, STOP taking any Aspirin (unless otherwise instructed by your surgeon) Aleve, Naproxen, Ibuprofen, Motrin, Advil, Goody's, BC's, all herbal medications, fish oil, and all vitamins.   Do NOT Smoke (Tobacco/Vaping) 24 hours prior to your procedure  If you use a CPAP at night, you may bring all equipment for your overnight stay.     You will be asked to remove any contacts, glasses, piercing's, hearing aid's, dentures/partials prior to surgery. Please bring cases for these items if needed.     Patients discharged the day of surgery will not be allowed to drive home, and someone needs to stay with them for 24 hours.  SURGICAL WAITING ROOM VISITATION Patients may have no more than 2 support people in the waiting area - these visitors may rotate.   Pre-op nurse will coordinate an appropriate time for 1 ADULT support person, who may not rotate, to accompany patient in pre-op.  Children under the age of 48 must have an adult with them who is not the patient and must remain in the main waiting area with an adult.  If the patient needs to stay at the hospital during part of  their recovery, the visitor guidelines for inpatient rooms apply.  Please refer to the [facility] website for the visitor guidelines for any additional information.   Special instructions:   [place]- Preparing For Surgery   Please follow these instructions carefully.   Shower the NIGHT BEFORE SURGERY and the MORNING OF SURGERY with DIAL Soap.   Pat yourself dry with a CLEAN TOWEL.  Wear CLEAN PAJAMAS to bed the night before surgery  Place CLEAN SHEETS on your bed the night of your first shower and DO NOT SLEEP WITH PETS.   Additional instructions for the day of surgery: DO NOT APPLY any lotions, deodorants, cologne, or perfumes.   Do not wear jewelry or makeup Do not wear nail polish, gel polish, artificial nails, or any other type of covering on natural nails (fingers and toes) Do not bring valuables to the hospital. [facility] is not responsible for valuables/personal belongings. Put on clean/comfortable clothes.  Please brush your teeth.  Ask your nurse before applying any prescription medications to the skin.

## 2023-06-12 NOTE — Progress Notes (Addendum)
PCP - Dr Toula Moos- at [facility] Cardiologist - denies  PPM/ICD - denies   Chest x-ray - 10/30/22 EKG - 10/30/22 Stress Test - denies ECHO - 01/23/22 Cardiac Cath - denies  CPAP - denies  DM- denies  ASA/Blood Thinner Instructions: n/a   ERAS Protcol - clears until 0830  COVID TEST- n/a  Anesthesia review: yes  Patient's nurse Lamanda verbally denies pt has any shortness of breath, fever, cough and chest pain during phone call      Questions were answered for pt's nurse. Lamanda verbalized understanding of instructions. Instructions faxed to [facility] at [employer] (Fax # [phone])

## 2023-06-13 ENCOUNTER — Encounter (HOSPITAL_COMMUNITY): Admission: RE | Payer: Self-pay | Source: Home / Self Care

## 2023-06-13 ENCOUNTER — Ambulatory Visit (HOSPITAL_COMMUNITY): Admission: RE | Admit: 2023-06-13 | Payer: 59 | Source: Home / Self Care | Admitting: Otolaryngology

## 2023-06-13 HISTORY — DX: Peripheral vascular disease, unspecified: I73.9

## 2023-06-13 SURGERY — EXCISION MASS HEAD
Anesthesia: General | Laterality: Right

## 2023-07-17 ENCOUNTER — Other Ambulatory Visit: Payer: Self-pay | Admitting: Otolaryngology

## 2023-07-24 ENCOUNTER — Other Ambulatory Visit: Payer: Self-pay

## 2023-07-24 ENCOUNTER — Encounter (HOSPITAL_COMMUNITY): Payer: Self-pay | Admitting: Otolaryngology

## 2023-07-24 NOTE — Pre-Procedure Instructions (Addendum)
 -------------  SDW INSTRUCTIONS given:   Your procedure is scheduled on 3/28.             Report to [facility] Main Entrance "A" at 08:45 A.M., and check in at the Admitting office.       Any questions or running late day of surgery: call [phone]                 Remember:             Do not eat after midnight the night before your surgery   You may drink clear liquids until 08:15 AM the morning of your surgery.   Clear liquids allowed are: Water, Non-Citrus Juices (without pulp), Carbonated Beverages, Clear Tea, Black Coffee Only, and Gatorade                          Take these medicines the morning of surgery with A SIP OF WATER   Amlodipine  Symbicort Bupropion  Klonopin  Duloxetine  Gabapentin  trintellix   If needed: Tylenol   albuterol   oxy IR   PLEASE SEND A PRINT-OUT OF THE MEDICATIONS SHE TAKES SHOWING THE LAST DOSE OF EACH MEDICATION. ALSO, PLEASE SEND THE LAB RESULTS FROM LABS SHE HAD DONE ON 3/19 (or most recent).   As of today, STOP taking any Aspirin (unless otherwise instructed by your surgeon) Aleve, Naproxen, Ibuprofen, Motrin, Advil, Goody's, BC's, all herbal medications, fish oil, and all vitamins.     Do NOT Smoke (Tobacco/Vaping) 24 hours prior to your procedure   If you use a CPAP at night, you may bring all equipment for your overnight stay.      You will be asked to remove any contacts, glasses, piercing's, hearing aid's, dentures/partials prior to surgery. Please bring cases for these items if needed.      Patients discharged the day of surgery will not be allowed to drive home, and someone needs to stay with them for 24 hours.   SURGICAL WAITING ROOM VISITATION Patients may have no more than 2 support people in the waiting area - these visitors may rotate.   Pre-op nurse will coordinate an appropriate time for 1 ADULT support person, who may not rotate, to accompany patient in pre-op.  Children under the age of 77 must have an adult with  them who is not the patient and must remain in the main waiting area with an adult.   If the patient needs to stay at the hospital during part of their recovery, the visitor guidelines for inpatient rooms apply.   Please refer to the [facility] website for the visitor guidelines for any additional information.     Special instructions:   [place]- Preparing For Surgery     Please follow these instructions carefully.              Shower the NIGHT BEFORE SURGERY and the MORNING OF SURGERY with DIAL Soap.    Pat yourself dry with a CLEAN TOWEL.   Wear CLEAN PAJAMAS to bed the night before surgery   Place CLEAN SHEETS on your bed the night of your first shower and DO NOT SLEEP WITH PETS.     Additional instructions for the day of surgery: DO NOT APPLY any lotions, deodorants, cologne, or perfumes.   Do not wear jewelry or makeup Do not wear nail polish, gel polish, artificial nails, or any other type of covering on natural nails (  fingers and toes) Do not bring valuables to the hospital. [facility] is not responsible for valuables/personal belongings. Put on clean/comfortable clothes.  Please brush your teeth.  Ask your nurse before applying any prescription medications to the skin.  IF YOU HAVE ANY QUESTIONS ABOUT THESE INSTRUCTIONS, PLEASE REACH OUT TO ME AT [phone]

## 2023-07-24 NOTE — Progress Notes (Signed)
   PCP - Dr Toula Moos- at [facility] Cardiologist - denies   PPM/ICD - denies     Chest x-ray - 10/30/22 EKG - 10/30/22 Stress Test - denies ECHO - 01/23/22 Cardiac Cath - denies   CPAP - denies   DM- denies   ASA/Blood Thinner Instructions: n/a     ERAS Protcol - clears until 0815   COVID TEST- n/a   Anesthesia review: no   Patient's nurse Morrie Sheldon verbally denies pt has any shortness of breath, fever, cough and chest pain during phone call         Questions were answered for pt's nurse. Morrie Sheldon verbalized understanding of instructions. Instructions faxed to [facility] at [employer] (Fax # [phone])

## 2023-07-25 ENCOUNTER — Ambulatory Visit (HOSPITAL_COMMUNITY): Admitting: Anesthesiology

## 2023-07-25 ENCOUNTER — Ambulatory Visit (HOSPITAL_COMMUNITY)
Admission: RE | Admit: 2023-07-25 | Discharge: 2023-07-25 | Disposition: A | Discharge status: Home / Self Care | Payer: 59 | Attending: Otolaryngology | Admitting: Otolaryngology

## 2023-07-25 ENCOUNTER — Encounter (HOSPITAL_COMMUNITY): Payer: Self-pay | Admitting: Otolaryngology

## 2023-07-25 ENCOUNTER — Other Ambulatory Visit: Payer: Self-pay

## 2023-07-25 ENCOUNTER — Encounter (HOSPITAL_COMMUNITY)
Admission: RE | Disposition: A | Discharge status: Home / Self Care | Payer: Self-pay | Source: Home / Self Care | Attending: Otolaryngology

## 2023-07-25 ENCOUNTER — Ambulatory Visit (HOSPITAL_BASED_OUTPATIENT_CLINIC_OR_DEPARTMENT_OTHER): Admitting: Anesthesiology

## 2023-07-25 DIAGNOSIS — J449 Chronic obstructive pulmonary disease, unspecified: Secondary | ICD-10-CM

## 2023-07-25 DIAGNOSIS — Z7951 Long term (current) use of inhaled steroids: Secondary | ICD-10-CM | POA: Insufficient documentation

## 2023-07-25 DIAGNOSIS — I509 Heart failure, unspecified: Secondary | ICD-10-CM | POA: Diagnosis not present

## 2023-07-25 DIAGNOSIS — C44311 Basal cell carcinoma of skin of nose: Secondary | ICD-10-CM

## 2023-07-25 DIAGNOSIS — F32A Depression, unspecified: Secondary | ICD-10-CM | POA: Diagnosis not present

## 2023-07-25 DIAGNOSIS — Z79899 Other long term (current) drug therapy: Secondary | ICD-10-CM | POA: Insufficient documentation

## 2023-07-25 DIAGNOSIS — Z6841 Body Mass Index (BMI) 40.0 and over, adult: Secondary | ICD-10-CM | POA: Insufficient documentation

## 2023-07-25 DIAGNOSIS — F419 Anxiety disorder, unspecified: Secondary | ICD-10-CM | POA: Insufficient documentation

## 2023-07-25 DIAGNOSIS — I110 Hypertensive heart disease with heart failure: Secondary | ICD-10-CM | POA: Diagnosis not present

## 2023-07-25 DIAGNOSIS — I739 Peripheral vascular disease, unspecified: Secondary | ICD-10-CM | POA: Insufficient documentation

## 2023-07-25 DIAGNOSIS — Z87891 Personal history of nicotine dependence: Secondary | ICD-10-CM | POA: Insufficient documentation

## 2023-07-25 DIAGNOSIS — M950 Acquired deformity of nose: Secondary | ICD-10-CM | POA: Diagnosis present

## 2023-07-25 DIAGNOSIS — G4730 Sleep apnea, unspecified: Secondary | ICD-10-CM | POA: Diagnosis not present

## 2023-07-25 DIAGNOSIS — R0602 Shortness of breath: Secondary | ICD-10-CM | POA: Insufficient documentation

## 2023-07-25 DIAGNOSIS — Z85828 Personal history of other malignant neoplasm of skin: Secondary | ICD-10-CM | POA: Insufficient documentation

## 2023-07-25 DIAGNOSIS — G35 Multiple sclerosis: Secondary | ICD-10-CM | POA: Diagnosis not present

## 2023-07-25 DIAGNOSIS — C4441 Basal cell carcinoma of skin of scalp and neck: Secondary | ICD-10-CM | POA: Insufficient documentation

## 2023-07-25 HISTORY — PX: NASAL FLAP ROTATION: SHX5366

## 2023-07-25 HISTORY — DX: Sleep apnea, unspecified: G47.30

## 2023-07-25 HISTORY — PX: SKIN FULL THICKNESS GRAFT: SHX442

## 2023-07-25 HISTORY — PX: EXCISION MASS HEAD: SHX6702

## 2023-07-25 LAB — CBC
HCT: 41.5 % (ref 36.0–46.0)
Hemoglobin: 13.3 g/dL (ref 12.0–15.0)
MCH: 26.7 pg (ref 26.0–34.0)
MCHC: 32 g/dL (ref 30.0–36.0)
MCV: 83.3 fL (ref 80.0–100.0)
Platelets: 224 10*3/uL (ref 150–400)
RBC: 4.98 MIL/uL (ref 3.87–5.11)
RDW: 13.6 % (ref 11.5–15.5)
WBC: 6.5 10*3/uL (ref 4.0–10.5)
nRBC: 0 % (ref 0.0–0.2)

## 2023-07-25 LAB — BASIC METABOLIC PANEL WITH GFR
Anion gap: 12 (ref 5–15)
BUN: 14 mg/dL (ref 8–23)
CO2: 26 mmol/L (ref 22–32)
Calcium: 9.3 mg/dL (ref 8.9–10.3)
Chloride: 101 mmol/L (ref 98–111)
Creatinine, Ser: 0.98 mg/dL (ref 0.44–1.00)
GFR, Estimated: >60 mL/min (ref >60)
Glucose, Bld: 99 mg/dL (ref 70–99)
Potassium: 4 mmol/L (ref 3.5–5.1)
Sodium: 139 mmol/L (ref 135–145)

## 2023-07-25 SURGERY — EXCISION, MASS, HEAD
Anesthesia: General | Laterality: Right

## 2023-07-25 MED ORDER — FENTANYL CITRATE (PF) 250 MCG/5ML IJ SOLN
INTRAMUSCULAR | Status: DC | PRN
  Administered 2023-07-25: 75 ug via INTRAVENOUS
  Administered 2023-07-25: 50 ug via INTRAVENOUS
  Administered 2023-07-25: 75 ug via INTRAVENOUS
  Administered 2023-07-25: 50 ug via INTRAVENOUS

## 2023-07-25 MED ORDER — ACETAMINOPHEN 325 MG PO TABS: 325.0000 mg | ORAL_TABLET | ORAL | Status: DC | PRN

## 2023-07-25 MED ORDER — ROCURONIUM BROMIDE 10 MG/ML (PF) SYRINGE
PREFILLED_SYRINGE | INTRAVENOUS | Status: DC | PRN
  Administered 2023-07-25: 70 mg via INTRAVENOUS

## 2023-07-25 MED ORDER — LACTATED RINGERS IV SOLN: INTRAVENOUS | Status: DC

## 2023-07-25 MED ORDER — OXYCODONE HCL 5 MG/5ML PO SOLN
ORAL | Status: AC
  Filled 2023-07-25: qty 5

## 2023-07-25 MED ORDER — LIDOCAINE-EPINEPHRINE 1 %-1:100000 IJ SOLN
INTRAMUSCULAR | Status: DC | PRN
  Administered 2023-07-25: 8 mL via INTRADERMAL

## 2023-07-25 MED ORDER — CEFAZOLIN SODIUM-DEXTROSE 2-4 GM/100ML-% IV SOLN
2.0000 g | INTRAVENOUS | Status: AC
  Administered 2023-07-25: 2 g via INTRAVENOUS
  Filled 2023-07-25: qty 100

## 2023-07-25 MED ORDER — PHENYLEPHRINE 80 MCG/ML (10ML) SYRINGE FOR IV PUSH (FOR BLOOD PRESSURE SUPPORT)
PREFILLED_SYRINGE | INTRAVENOUS | Status: AC
  Filled 2023-07-25: qty 10

## 2023-07-25 MED ORDER — BACITRACIN ZINC 500 UNIT/GM EX OINT
TOPICAL_OINTMENT | CUTANEOUS | Status: DC | PRN
  Administered 2023-07-25: 1 via TOPICAL

## 2023-07-25 MED ORDER — OXYCODONE HCL 5 MG/5ML PO SOLN
5.0000 mg | Freq: Once | ORAL | Status: AC | PRN
  Administered 2023-07-25: 5 mg via ORAL

## 2023-07-25 MED ORDER — BSS IO SOLN
INTRAOCULAR | Status: DC | PRN
  Administered 2023-07-25: 15 mL via INTRAOCULAR

## 2023-07-25 MED ORDER — FENTANYL CITRATE (PF) 250 MCG/5ML IJ SOLN
INTRAMUSCULAR | Status: AC
  Filled 2023-07-25: qty 5

## 2023-07-25 MED ORDER — ONDANSETRON HCL 4 MG/2ML IJ SOLN
INTRAMUSCULAR | Status: AC
  Filled 2023-07-25: qty 2

## 2023-07-25 MED ORDER — SURGIFLO WITH THROMBIN (HEMOSTATIC MATRIX KIT) OPTIME
TOPICAL | Status: DC | PRN
  Administered 2023-07-25: 1 via TOPICAL

## 2023-07-25 MED ORDER — FENTANYL CITRATE (PF) 100 MCG/2ML IJ SOLN: 25.0000 ug | INTRAMUSCULAR | Status: DC | PRN

## 2023-07-25 MED ORDER — DEXAMETHASONE SODIUM PHOSPHATE 10 MG/ML IJ SOLN
INTRAMUSCULAR | Status: AC
  Filled 2023-07-25: qty 1

## 2023-07-25 MED ORDER — SUGAMMADEX SODIUM 200 MG/2ML IV SOLN
INTRAVENOUS | Status: DC | PRN
  Administered 2023-07-25: 400 mg via INTRAVENOUS

## 2023-07-25 MED ORDER — ACETAMINOPHEN 10 MG/ML IV SOLN: 1000.0000 mg | Freq: Once | INTRAVENOUS | Status: DC | PRN

## 2023-07-25 MED ORDER — LIDOCAINE 2% (20 MG/ML) 5 ML SYRINGE
INTRAMUSCULAR | Status: AC
  Filled 2023-07-25: qty 5

## 2023-07-25 MED ORDER — PROPOFOL 10 MG/ML IV BOLUS
INTRAVENOUS | Status: AC
  Filled 2023-07-25: qty 20

## 2023-07-25 MED ORDER — ACETAMINOPHEN 160 MG/5ML PO SOLN: 325.0000 mg | ORAL | Status: DC | PRN

## 2023-07-25 MED ORDER — PROPOFOL 10 MG/ML IV BOLUS
INTRAVENOUS | Status: DC | PRN
  Administered 2023-07-25: 140 mg via INTRAVENOUS

## 2023-07-25 MED ORDER — ONDANSETRON HCL 4 MG/2ML IJ SOLN
INTRAMUSCULAR | Status: DC | PRN
  Administered 2023-07-25: 4 mg via INTRAVENOUS

## 2023-07-25 MED ORDER — 0.9 % SODIUM CHLORIDE (POUR BTL) OPTIME
TOPICAL | Status: DC | PRN
  Administered 2023-07-25: 1000 mL

## 2023-07-25 MED ORDER — CHLORHEXIDINE GLUCONATE 0.12 % MT SOLN
15.0000 mL | Freq: Once | OROMUCOSAL | Status: AC
  Administered 2023-07-25: 15 mL via OROMUCOSAL
  Filled 2023-07-25: qty 15

## 2023-07-25 MED ORDER — ORAL CARE MOUTH RINSE: 15.0000 mL | Freq: Once | OROMUCOSAL | Status: AC

## 2023-07-25 MED ORDER — DROPERIDOL 2.5 MG/ML IJ SOLN: 0.6250 mg | Freq: Once | INTRAMUSCULAR | Status: DC | PRN

## 2023-07-25 MED ORDER — DEXAMETHASONE SODIUM PHOSPHATE 10 MG/ML IJ SOLN
INTRAMUSCULAR | Status: DC | PRN
  Administered 2023-07-25: 5 mg via INTRAVENOUS

## 2023-07-25 MED ORDER — BACITRACIN ZINC 500 UNIT/GM EX OINT
TOPICAL_OINTMENT | CUTANEOUS | Status: AC
Start: 2023-07-25 — End: ?
  Filled 2023-07-25: qty 28.35

## 2023-07-25 MED ORDER — ROCURONIUM BROMIDE 10 MG/ML (PF) SYRINGE
PREFILLED_SYRINGE | INTRAVENOUS | Status: AC
  Filled 2023-07-25: qty 10

## 2023-07-25 MED ORDER — EPHEDRINE 5 MG/ML INJ
INTRAVENOUS | Status: AC
  Filled 2023-07-25: qty 5

## 2023-07-25 MED ORDER — OXYCODONE HCL 5 MG PO TABS: 5.0000 mg | ORAL_TABLET | Freq: Once | ORAL | Status: AC | PRN

## 2023-07-25 MED ORDER — LIDOCAINE 2% (20 MG/ML) 5 ML SYRINGE
INTRAMUSCULAR | Status: DC | PRN
  Administered 2023-07-25: 40 mg via INTRAVENOUS

## 2023-07-25 MED ORDER — LIDOCAINE-EPINEPHRINE 1 %-1:100000 IJ SOLN
INTRAMUSCULAR | Status: AC
  Filled 2023-07-25: qty 1

## 2023-07-25 MED ORDER — BSS IO SOLN
INTRAOCULAR | Status: AC
Start: 2023-07-25 — End: ?
  Filled 2023-07-25: qty 15

## 2023-07-25 SURGICAL SUPPLY — 74 items
APPLICATOR DR MATTHEWS STRL (MISCELLANEOUS) ×1 IMPLANT
BAG COUNTER SPONGE SURGICOUNT (BAG) ×1 IMPLANT
BETADINE 5% OPHTHALMIC (OPHTHALMIC) ×2 IMPLANT
BLADE SURG 15 STRL LF DISP TIS (BLADE) ×1 IMPLANT
BRUSH SCRUB EZ PLAIN DRY (MISCELLANEOUS) IMPLANT
CANISTER SUCT 3000ML PPV (MISCELLANEOUS) ×1 IMPLANT
CLEANER TIP ELECTROSURG 2X2 (MISCELLANEOUS) ×1 IMPLANT
CNTNR URN SCR LID CUP LEK RST (MISCELLANEOUS) ×1 IMPLANT
CORD BIPOLAR FORCEPS 12FT (ELECTRODE) IMPLANT
COVER SURGICAL LIGHT HANDLE (MISCELLANEOUS) ×1 IMPLANT
DERMABOND ADVANCED .7 DNX12 (GAUZE/BANDAGES/DRESSINGS) ×1 IMPLANT
DRAPE HALF SHEET 40X57 (DRAPES) ×1 IMPLANT
DRESSING NASAL POPE 10X1.5X2.5 (GAUZE/BANDAGES/DRESSINGS) ×1 IMPLANT
DRSG NASAL POPE 10X1.5X2.5 (GAUZE/BANDAGES/DRESSINGS) ×1 IMPLANT
DRSG TEGADERM 2-3/8X2-3/4 SM (GAUZE/BANDAGES/DRESSINGS) ×1 IMPLANT
DRSG TEGADERM 4X4.75 (GAUZE/BANDAGES/DRESSINGS) IMPLANT
DRSG TELFA 3X8 NADH STRL (GAUZE/BANDAGES/DRESSINGS) ×1 IMPLANT
ELECT COATED BLADE 2.86 ST (ELECTRODE) ×1 IMPLANT
ELECT NDL BLADE 2-5/6 (NEEDLE) ×1 IMPLANT
ELECT NEEDLE BLADE 2-5/6 (NEEDLE) ×1 IMPLANT
ELECT REM PT RETURN 9FT ADLT (ELECTROSURGICAL) ×1 IMPLANT
ELECTRODE REM PT RTRN 9FT ADLT (ELECTROSURGICAL) ×1 IMPLANT
GAUZE 4X4 16PLY ~~LOC~~+RFID DBL (SPONGE) ×1 IMPLANT
GAUZE KITTNER 4X10 (MISCELLANEOUS) IMPLANT
GAUZE KITTNER 4X5 RF (MISCELLANEOUS) IMPLANT
GAUZE XEROFORM 1X8 LF (GAUZE/BANDAGES/DRESSINGS) IMPLANT
GAUZE XEROFORM 5X9 LF (GAUZE/BANDAGES/DRESSINGS) IMPLANT
GLOVE BIO SURGEON STRL SZ7.5 (GLOVE) ×1 IMPLANT
GLOVE BIOGEL PI IND STRL 8 (GLOVE) ×1 IMPLANT
GOWN STRL REUS W/ TWL LRG LVL3 (GOWN DISPOSABLE) ×1 IMPLANT
GOWN STRL REUS W/ TWL XL LVL3 (GOWN DISPOSABLE) ×1 IMPLANT
KIT BASIN OR (CUSTOM PROCEDURE TRAY) ×1 IMPLANT
KIT TURNOVER KIT B (KITS) ×1 IMPLANT
MARKER SKIN DUAL TIP RULER LAB (MISCELLANEOUS) ×1 IMPLANT
MATRIX WOUND MESHED 2X2 (Tissue) IMPLANT
NDL 27GX1/2 REG BEVEL ECLIP (NEEDLE) ×1 IMPLANT
NDL HYPO 30X.5 LL (NEEDLE) ×1 IMPLANT
NDL PRECISIONGLIDE 27X1.5 (NEEDLE) ×1 IMPLANT
NEEDLE 27GX1/2 REG BEVEL ECLIP (NEEDLE) ×1 IMPLANT
NEEDLE HYPO 30X.5 LL (NEEDLE) ×1 IMPLANT
NEEDLE PRECISIONGLIDE 27X1.5 (NEEDLE) ×1 IMPLANT
NS IRRIG 1000ML POUR BTL (IV SOLUTION) ×1 IMPLANT
PAD ARMBOARD POSITIONER FOAM (MISCELLANEOUS) ×2 IMPLANT
PATTIES SURGICAL .5 X3 (DISPOSABLE) IMPLANT
PENCIL SMOKE EVACUATOR (MISCELLANEOUS) ×1 IMPLANT
POSITIONER HEAD DONUT 9IN (MISCELLANEOUS) ×1 IMPLANT
SPONGE T-LAP 18X18 ~~LOC~~+RFID (SPONGE) ×1 IMPLANT
STAPLER SKIN PROX WIDE 3.9 (STAPLE) IMPLANT
STAPLER VISISTAT 35W (STAPLE) ×1 IMPLANT
SURGIFLO W/THROMBIN 8M KIT (HEMOSTASIS) IMPLANT
SUT CHROMIC 4 0 PS 2 18 (SUTURE) IMPLANT
SUT CHROMIC 4 0 RB 1X27 (SUTURE) IMPLANT
SUT CHROMIC 4 0 SH 27 (SUTURE) IMPLANT
SUT CHROMIC 5 0 P 3 (SUTURE) IMPLANT
SUT CHROMIC 5 0 RB 1 27 (SUTURE) IMPLANT
SUT CHROMIC GUT BROWN 0 54 (SUTURE) IMPLANT
SUT ETHILON 5 0 PS 2 18 (SUTURE) IMPLANT
SUT ETHILON 6 0 9-3 1X18 BLK (SUTURE) IMPLANT
SUT ETHILON 6 0 P 1 (SUTURE) IMPLANT
SUT MON AB 5-0 P3 18 (SUTURE) IMPLANT
SUT PDS PLUS 5 RB-2 (SUTURE) IMPLANT
SUT SILK 2 0 PERMA HAND 18 BK (SUTURE) IMPLANT
SUT SILK 2 0 SH (SUTURE) IMPLANT
SUT SILK 4 0 CR 8 RB 1 (SUTURE) IMPLANT
SUT VIC AB 3-0 X1 27 (SUTURE) IMPLANT
SUT VIC AB 4-0 PS2 18 (SUTURE) IMPLANT
SUT VIC AB 4-0 PS2 27 (SUTURE) IMPLANT
SUT VICRYL 4-0 PS2 18IN ABS (SUTURE) IMPLANT
SYR CONTROL 10ML LL (SYRINGE) ×1 IMPLANT
TOWEL GREEN STERILE (TOWEL DISPOSABLE) ×1 IMPLANT
TOWEL GREEN STERILE FF (TOWEL DISPOSABLE) ×1 IMPLANT
TRAY ENT MC OR (CUSTOM PROCEDURE TRAY) ×1 IMPLANT
TUBE CONNECTING 12X1/4 (SUCTIONS) ×1 IMPLANT
WATER STERILE IRR 1000ML POUR (IV SOLUTION) ×1 IMPLANT

## 2023-07-25 NOTE — Discharge Instructions (Signed)
 Post-operative Patient Instructions [name]. Hoshal MD  Surgery What to expect: A bandage will be placed on your surgical sites. You can leave the bandages in place until you return to the clinic. You may be scheduled for a series of wound care appointments over the next month.  Recovery/Restrictions: -No strenuous activity for at least the first week after your procedure -Bruising and swelling are expected and will take weeks to go away (consider using ice packs) -Please contact our office immediately if you experience any signs/symptoms of infection (redness, pain, or fever of 100.53F or greater)  Wound Wound care: The goal is to keep your wounds clean and moist to prevent scabs or crusts. You will keep your current post-operative dressing in place undisturbed until after your first post-operative clinic visit. The following instructions apply after your first post-operative visit.  1. Clean wound wounds with soap and water using a cue tip if any crusts are present  2. Next, apply a thin layer of Aquaphor ointment 3. Apply Telfa and cover with brown tape  4. If you had an ear surgery - please apply a thin layer of antibiotic ointment or Aquaphor ointment to your ear incision every day  Care Healing Period: For the best healing, please protect the area from the sun   You may be asked to begin massaging the scars several weeks after surgery. Scars can be massaged in horizontal, vertical, and circular motions.   Adult Post-Operative Pain Management  Pain medication is given immediately following your surgery to help with post-operative pain. Do not wake up or set an alarm to wake up and take pain medications. Sleep and rest.  Upon your discharge home, we suggest scheduled doses of Acetaminophen (Tylenol) every 6 hours and Ibuprofen (Motrin) every 6 hours, alternating between medications every 3 hours (i.e. Take Tylenol and wait 3 hours, then take Motrin and wait 3 hours, repeat) for the  first 3-4 days after surgery. If you are without significant pain, medications can be taken more infrequently. It is important to follow dosing instructions on the medication bottle or prescription.   Sample of medication dosing schedule  Give dose of: Time: Given:  Acetaminophen 12 a.m.   Ibuprofen 3 a.m.   Acetaminophen 6 a.m.   Ibuprofen 9 a.m.   Acetaminophen 12 p.m.   Ibuprofen 3 p.m.   Acetaminophen 6 p.m.   Ibuprofen 9 p.m.    If you need to call after clinic hours for a concern, call [phone] and ask for the "physician on call for ENT."  [address] [place], [place] [address] Phone: [phone]

## 2023-07-25 NOTE — H&P (Addendum)
 [name] is an 73 y.o. female.    Chief Complaint:  Acquired nasal deformity after mohs surgery for [facility]  HPI: Patient presents today for planned elective procedure.  He/she denies any interval change in history since office visit on 03/14/23.  Past Medical History:  Diagnosis Date   Abdominal aortic atherosclerosis (HCC) 11/11/2016   ADHD    Anxiety    COPD (chronic obstructive pulmonary disease) (HCC)    Depression    major depressive   Dyspnea    doe   Edema    left leg   Follicular lymphoma (HCC)    B Cell   Follicular lymphoma grade II (HCC)    Hypertension    Hypotension    idiopathic   Kyphoscoliosis and scoliosis 11/26/2011   Morbid obesity (HCC) 01/05/2016   Multiple sclerosis (HCC)    Multiple sclerosis (HCC)    1980's   Neuromuscular disorder (HCC)    Obstructive and reflux uropathy    foley   Pain    atypical facial   Peripheral vascular disease (HCC)    Peripheral vascular disease of lower extremity with ulceration (HCC) 11/08/2015   Skin ulcer (HCC) 11/08/2015   Sleep apnea    per pt report   Weakness    generalized. has MS    Past Surgical History:  Procedure Laterality Date   AMPUTATION Left 02/01/2022   Procedure: AMPUTATION BELOW KNEE;  Surgeon: Annice Needy, MD;  Location: [facility] ORS;  Service: Vascular;  Laterality: Left;   BACK SURGERY N/A 2002   BONE BIOPSY Left 12/24/2021   Procedure: BONE BIOPSY-HEEL BONE;  Surgeon: Vivi Barrack, DPM;  Location: [facility] ORS;  Service: Podiatry;  Laterality: Left;   CYST EXCISION     lower back   INGUINAL LYMPH NODE BIOPSY Left 07/04/2016   Procedure: INGUINAL LYMPH NODE BIOPSY;  Surgeon: Kieth Brightly, MD;  Location: [facility] ORS;  Service: General;  Laterality: Left;   ORIF FEMUR FRACTURE Left 02/04/2020   Procedure: OPEN REDUCTION INTERNAL FIXATION (ORIF) DISTAL FEMUR FRACTURE;  Surgeon: Roby Lofts, MD;  Location: [facility] OR;  Service: Orthopedics;  Laterality: Left;   PORTACATH PLACEMENT N/A  07/22/2016   Procedure: INSERTION PORT-A-CATH;  Surgeon: Kieth Brightly, MD;  Location: [facility] ORS;  Service: General;  Laterality: N/A;   TONSILLECTOMY AND ADENOIDECTOMY     TUBAL LIGATION     WOUND DEBRIDEMENT Left 12/24/2021   Procedure: DEBRIDEMENT WOUND-HEEL ULCER;  Surgeon: Vivi Barrack, DPM;  Location: [facility] ORS;  Service: Podiatry;  Laterality: Left;    Family History  Problem Relation Age of Onset   COPD Mother    Diabetes Mother    Heart failure Mother    Alcohol abuse Father    Kidney disease Father    Kidney failure Father    Arthritis Sister    CAD Maternal Grandmother    Stroke Maternal Grandfather    Arthritis Sister    Mental illness Sister    Arthritis Brother     Social History:  reports that she quit smoking about 7 years ago. Her smoking use included cigarettes. She started smoking about 28 years ago. She has a 20.8 pack-year smoking history. She has never used smokeless tobacco. She reports that she does not currently use drugs. She reports that she does not drink alcohol.  Allergies: No Known Allergies  Medications Prior to Admission  Medication Sig Dispense Refill   acetaminophen (TYLENOL) 500 MG tablet Take 1,000 mg by mouth every  8 (eight) hours as needed for moderate pain.     albuterol (PROVENTIL) (2.5 MG/3ML) 0.083% nebulizer solution Take 2.5 mg by nebulization every 4 (four) hours as needed for wheezing or shortness of breath.     Amino Acids-Protein Hydrolys (FEEDING SUPPLEMENT, PRO-STAT SUGAR FREE 64,) LIQD Take 30 mLs by mouth 3 (three) times daily with meals. TID     amLODipine (NORVASC) 5 MG tablet Take 5 mg by mouth daily.     budesonide-formoterol (SYMBICORT) 160-4.5 MCG/ACT inhaler Inhale 2 puffs into the lungs 2 (two) times daily.      buPROPion (WELLBUTRIN XL) 300 MG 24 hr tablet Take 300 mg by mouth daily.     chlorhexidine (HIBICLENS) 4 % external liquid Apply 1 Application topically daily.     cholecalciferol (VITAMIN D3) 25 MCG  (1000 UNIT) tablet Take 2,000 Units by mouth daily.     clonazePAM (KLONOPIN) 0.5 MG tablet Take 1 tablet (0.5 mg total) by mouth 2 (two) times daily as needed for anxiety. Home med. (Patient taking differently: Take 0.5 mg by mouth 2 (two) times daily. Home med.) 10 tablet 0   Cranberry 400 MG CAPS Take 400 mg by mouth 2 (two) times daily.     docusate sodium (COLACE) 100 MG capsule Take 1 capsule (100 mg total) by mouth 2 (two) times daily as needed. (Patient taking differently: Take 100 mg by mouth 2 (two) times daily.)     DULoxetine (CYMBALTA) 60 MG capsule Take 1 capsule (60 mg total) by mouth every morning. 30 capsule 3   gabapentin (NEURONTIN) 600 MG tablet Take 600 mg by mouth 3 (three) times daily.     hydrOXYzine (ATARAX) 25 MG tablet Take 50 mg by mouth at bedtime.     ivermectin (STROMECTOL) 3 MG TABS tablet Take by mouth once. 7 tablets once a day on thursdays     magnesium oxide (MAG-OX) 400 (240 Mg) MG tablet Take 400 mg by mouth daily.     Multiple Vitamin (MULTIVITAMIN WITH MINERALS) TABS tablet Take 1 tablet by mouth daily.     naloxone (NARCAN) nasal spray 4 mg/0.1 mL Place 1 spray into the nose 3 (three) times daily as needed (symptoms of opioid overdose).     oxyCODONE (OXY IR/ROXICODONE) 5 MG immediate release tablet Take 1 tablet (5 mg total) by mouth every 8 (eight) hours as needed for moderate pain or severe pain. Home med. 10 tablet 0   QUEtiapine Fumarate 150 MG TABS Take 150 mg by mouth at bedtime.     Spinosad 0.9 % SUSP Apply topically. Topical once a day on wednesdays     traZODone (DESYREL) 50 MG tablet To be resumed by facility physician as deemed appropriate. (Patient taking differently: Take 50 mg by mouth at bedtime.)     triamcinolone cream (KENALOG) 0.1 % Apply 1 Application topically 2 (two) times daily.     vortioxetine HBr (TRINTELLIX) 5 MG TABS tablet Duplicate psych med.  To be resumed by facility physician as deemed appropriate. (Patient taking  differently: Take 5 mg by mouth daily.) 30 tablet     Results for orders placed or performed during the hospital encounter of 07/25/23 (from the past 48 hours)  CBC per protocol     Status: None   Collection Time: 07/25/23 10:08 AM  Result Value Ref Range   WBC 6.5 4.0 - 10.5 K/uL   RBC 4.98 3.87 - 5.11 MIL/uL   Hemoglobin 13.3 12.0 - 15.0 g/dL   HCT 41.5  36.0 - 46.0 %   MCV 83.3 80.0 - 100.0 fL   MCH 26.7 26.0 - 34.0 pg   MCHC 32.0 30.0 - 36.0 g/dL   RDW 16.1 09.6 - 04.5 %   Platelets 224 150 - 400 K/uL   nRBC 0.0 0.0 - 0.2 %    Comment: Performed at [facility], [address], [place], [place] [address]   No results found.  ROS: negative other than stated in HPI  Blood pressure (!) 119/58, pulse (!) 51, temperature 97.7 F (36.5 C), temperature source Oral, resp. rate 18, height 5\' 4"  (1.626 m), weight 124.7 kg, SpO2 98%.  PHYSICAL EXAM: General: Resting comfortably in NAD  Lungs: Non-labored respiratinos  Studies Reviewed: none   Assessment/Plan Acquired nasal deformity - subtotal nasal defect Basal cell carcinoma of the skin of nose  Proceed with repair mohs defect of the nose with paramedian forehead flap, adjacent tissue transfer, auricular cartilage graft, synthetic graft.  Informed consent obtained. RBA discussed.    Electronically signed by:  Scarlette Ar, MD  Staff Physician Facial Plastic & Reconstructive Surgery Otolaryngology - Head and Neck Surgery [facility]  07/25/2023, 10:52 AM  Addendum:  Discussed patient's DNR and holding this during surgery/peri-operatively. Patient wishes to [facility] DNR order during general anesthesia today. Should a major cardiopulmonary event occur she would like all resuscitative measures to save her life/stabilize her and then to follow advanced directives in the days ahead.  Electronically signed by:  Scarlette Ar, MD  Staff Physician Facial  Plastic & Reconstructive Surgery Otolaryngology - Head and Neck Surgery [facility] [place]

## 2023-07-25 NOTE — Addendum Note (Signed)
 Addendum  created 07/25/23 1602 by Eulah Pont, CRNA   Flowsheet accepted

## 2023-07-25 NOTE — Op Note (Signed)
 FACIAL PLASTIC SURGERY OPERATIVE NOTE  [name] Date/Time of Admission: 07/25/2023  9:01 AM  CSN: [id number];MRN:[id number] Attending Provider: Scarlette Ar, MD Room/Bed: MCPO/NONE DOB: Dec 20, 1950 Age: 73 y.o.   Pre-Op Diagnosis: Basal cell carcinoma of nasal tip; Acquired nasal deformity  Post-Op Diagnosis: Basal cell carcinoma of nasal tip; Acquired nasal deformity, malignant neoplasm of scalp   Procedure: Repair nasal mohs defect with interpolated pedicled paramedian forehead flap 3.5x2.5cm (CPT 15731) Right auricular cartilage graft harvest for right nasal alar reconstruction (CPT 21235) Excision wound wound head 3x2.5cm in preparation for nasal flap (CPT 15004) Repair of forehead donor site with 3x2.5cm integra acellular matrix (CPT 15275) Shave biopsy malignant neoplasm of scalp   Anesthesia: General  Surgeon(s): [name] Kung, MD  Staff: Circulator: Despina Arias, RN Scrub Person: Solmon Ice, RN Circulator Assistant: Hermelinda Dellen, RN  Implants: Implant Name Type Inv. Item Serial No. Manufacturer Lot No. LRB No. Used Action  MATRIX WOUND MESHED 2X2 - [id number] Tissue MATRI[employer] WOUND MESHED 2X2  [employer] [id number] N/A 1 Implanted    Specimens: ID Type Source Tests Collected by Time Destination  1 : Scalp tumor biopsy Tissue PATH ENT excision SURGICAL PATHOLOGY [name] Ar, MD 07/25/2023 1327     Complications: none  EBL: 30 ML  IVF: Per anesthesia ML  Condition: stable  Operative Findings:  3x2.5cm subtotal nasal defect involving entire nasal tip , near total right nasal ala , nasal dorsum and nasal sidewall; subtotal resection of right lower lateral cartilage  Haiku Photo:     3x2cm vertex scalp tumor 3 cm posterior to forheead flap donor site concealed in hairline; shave biopsy performed today  Haiku Photo:    Indications for Procedure: [name] is a 73 year old female nursing home patient with  history of morbid obesity, COPD, BKA, who presents with large basal cell carcinoma of the nose s/p mohs resection by Dr. Jeannine [name] 07/23/23. Discussed staged reconstructive surgery today for nasal defect. Informed consent obtained.   Description of Operation:   The patient was identified in the preoperative area and consent confirmed in the chart.  She was brought to the operating room by the anesthetist and a preoperative huddle was performed confirming patient identity and procedure to be performed.  Once all were in agreement we proceed with surgery.  General anesthesia was induced and the patient intubated with a ETT.  This was secured and patient was then turned 90 degrees from the anesthetist.  The patient's nasal dressings were removed demonstrating the large nasal defect approximately 3 x 2.5 cm subtotal skin soft tissue envelope resection with resection of the nasal cartilages as documented above.  Due to the large defect, a forehead flap was warranted.  The wound bed was anesthetized with lidocaine and epinephrine.  A left paramedian forehead flap was planned off the supratrochlear vessel about 20 mm from the midline with a 14 mm pedicle.  1% lido with epi was used anesthetize the forehead and the right ear  We then prepped and draped patient in standard fashion for procedure of this kind.  A final preoperative pause was performed and we proceeded with surgery.  We began with right auricular cartilage harvest a crescentic incision just in side the antihelical crus was created with a 15 blade approximately 2 cm in length.  A subperichondrial plane was dissected sharply.  A crest enteric piece of auricular cartilage and the chondral bowl approximately 2 cm x 8 mm was harvested sharply.  The hemostasis was achieved with Bovie.  The wound was closed with 5-0 Chromic Gut sutures and a dental roll bolster was secured with a 2-0 silk to reduce hematoma rest the ear was then covered in Tegaderm to avoid  contamination from nasal secretions.  We proceeded with excision and debridement of the nasal wound for inset of the flap the wound edges were sharply cleansed and a sub-SMAS plane was gently developed several millimeters circumferentially to allow for tension-free closure.  Hemostasis was achieved with the Bovie.  The lower lateral cartilage was dissected out in a supra perichondrial plane to allow for delineation of the cartilaginous defect.  This involved the majority of the lateral crura and the intermediate crura of the right lower lateral cartilage.  The internal lining defect approximately 4mm was closed with interrupted 5-0 Chromic Gut sutures in a watertight fashion.  We then inset the auricular cartilage graft using 5-0 PDS.  This was secured along the remnant of the right lower lateral cartilage to improve the patency of the nasal valve.  We then used a foil template to create the 3 x 2.5 centimeter forehead flap template and transposed this to the left forehead. We used this to design a standard forehead flap with the pedicle as described previously. An arc of rotation was ensured to be adequate using a laparotomy sponge blue tie.  15 blade was used to incise the forehead down to the subgaleal plane circumferentially and along the pedicle to the level of the eyebrow.  Approximately 1 cm above the brow the Bovie was used to create a periosteal window and a gauze Ray-Tec was used to elevate a subperiosteal plane to release the supratrochlear neurovascular bundle off of the orbital rim.  Laterally to the pedicle the corrugator supraciliary musculature was carefully divided with the Bovie and adequate tension was released.  The interpolated pedicled neurovascular flap was then inset into the nasal defect using buried interrupted 5-0 Monocryl sutures and interrupted 6-0 nylon sutures for skin. The alar rim was closed with interrupted 5-0 chromic in water tight fashion after defatting subcutaneous  tissue from the tip of the forehead flap to allow for rolling into the vestibular lining.    The forehead pedicle donor site was dissected in a subgaleal plane  8x6cm. The forehead pedicle site was closed with buried interrupted 4-0 Vicryl galeal sutures and interrupted 5-0 nylon for the skin.   The flap donor site was reconstructed with an integra acellular matrix 3x2.5cm , inset with 5-0 chromic gut sutures and secured with a xeroform bolster using 4-0 silk sutures.    The flap was reassessed demonstrating good capillary refill and perfusion. The pedicle was wrapped in xeroform and instilled with surgiflo hemostatic matrix. The wound's were then dressed with a combination of bacitracin, Telfa and brown paper tape.    Lastly - a 3x2cm ulcerated tumor was identified in the scalp hairline about 3cm posterior to the forehead flap donor site. A shave biopsy was performed with a 15 blade and sent for pathology. Further treatment pending surgical path.   The patient was then turned back to the anesthetist who extubated and brought her to the recovery room in stable condition.  All counts were correct and final.    [name] Kung, MD [facility]  07/25/2023

## 2023-07-25 NOTE — Anesthesia Postprocedure Evaluation (Addendum)
 Anesthesia Post Note  Patient: [name]  Procedure(s) Performed: EXCISION AND REPAIR OF RIGHT NOSE MOHS DEFECT (Right) ADJACENT TISSUE TRANSFER; POSSIBLE SKIN GRAFT FROM RIGHT EAR OR FOREHEAD; POSSIBLE RIGHT EAR CARTILAGE GRAFT (Right) POSSIBLE NASOLABIAL FLAP; POSSIBLE PARAMEDIAN FOREHEAD FLAP (Right)     Patient location during evaluation: PACU Anesthesia Type: General Level of consciousness: awake and alert Pain management: pain level controlled Vital Signs Assessment: post-procedure vital signs reviewed and stable Respiratory status: spontaneous breathing, nonlabored ventilation, respiratory function stable and patient connected to nasal cannula oxygen Cardiovascular status: blood pressure returned to baseline and stable Postop Assessment: no apparent nausea or vomiting Anesthetic complications: no  No notable events documented.  Last Vitals:  Vitals:   07/25/23 1415 07/25/23 1430  BP: (!) 119/90 (!) 133/90  Pulse: (!) 59 (!) 58  Resp: 19 13  Temp:  36.7 C  SpO2: 94% 95%                Shelton Silvas

## 2023-07-25 NOTE — Anesthesia Preprocedure Evaluation (Addendum)
 Anesthesia Evaluation  Patient identified by MRN, date of birth, ID band Patient awake    Reviewed: Allergy & Precautions, NPO status , Patient's Chart, lab work & pertinent test results  Airway Mallampati: I  TM Distance: <3 FB Neck ROM: Full    Dental  (+) Edentulous Upper, Edentulous Lower   Pulmonary shortness of breath, sleep apnea , COPD,  COPD inhaler, former smoker    + decreased breath sounds      Cardiovascular hypertension, Pt. on medications + Peripheral Vascular Disease and +CHF   Rhythm:Regular Rate:Normal     Neuro/Psych  PSYCHIATRIC DISORDERS Anxiety Depression     Neuromuscular disease    GI/Hepatic negative GI ROS, Neg liver ROS,,,  Endo/Other  negative endocrine ROS    Renal/GU Renal disease     Musculoskeletal negative musculoskeletal ROS (+)    Abdominal  (+) + obese  Peds  Hematology negative hematology ROS (+)   Anesthesia Other Findings - Multiple Sclerosis  Reproductive/Obstetrics                             Anesthesia Physical Anesthesia Plan  ASA: 3  Anesthesia Plan: General   Post-op Pain Management: Tylenol PO (pre-op)* and Toradol IV (intra-op)*   Induction: Intravenous  PONV Risk Score and Plan: 4 or greater and Ondansetron, Dexamethasone, Midazolam and Scopolamine patch - Pre-op  Airway Management Planned: Oral ETT  Additional Equipment: None  Intra-op Plan:   Post-operative Plan: Extubation in OR  Informed Consent: I have reviewed the patients History and Physical, chart, labs and discussed the procedure including the risks, benefits and alternatives for the proposed anesthesia with the patient or authorized representative who has indicated his/her understanding and acceptance.       Plan Discussed with: CRNA  Anesthesia Plan Comments:        Anesthesia Quick Evaluation

## 2023-07-25 NOTE — Transfer of Care (Signed)
 Immediate Anesthesia Transfer of Care Note  Patient: [name]  Procedure(s) Performed: EXCISION AND REPAIR OF RIGHT NOSE MOHS DEFECT (Right) ADJACENT TISSUE TRANSFER; POSSIBLE SKIN GRAFT FROM RIGHT EAR OR FOREHEAD; POSSIBLE RIGHT EAR CARTILAGE GRAFT (Right) POSSIBLE NASOLABIAL FLAP; POSSIBLE PARAMEDIAN FOREHEAD FLAP (Right)  Patient Location: PACU  Anesthesia Type:General  Level of Consciousness: awake, drowsy, and patient cooperative  Airway & Oxygen Therapy: Patient Spontanous Breathing and Patient connected to face mask oxygen  Post-op Assessment: Report given to RN and Post -op Vital signs reviewed and stable  Post vital signs: Reviewed and stable  Last Vitals:  Vitals Value Taken Time  BP 114/94 07/25/23 1400  Temp 36.7 C 07/25/23 1400  Pulse 53 07/25/23 1407  Resp 13 07/25/23 1407  SpO2 96 % 07/25/23 1407  Vitals shown include unfiled device data.  Last Pain:  Vitals:   07/25/23 0957  TempSrc:   PainSc: 7          Complications: No notable events documented.

## 2023-07-25 NOTE — Anesthesia Procedure Notes (Signed)
 Procedure Name: Intubation Date/Time: 07/25/2023 11:47 AM  Performed by: Eulah Pont, CRNAPre-anesthesia Checklist: Patient identified, Emergency Drugs available, Suction available and Patient being monitored Patient Re-evaluated:Patient Re-evaluated prior to induction Oxygen Delivery Method: Circle system utilized Preoxygenation: Pre-oxygenation with 100% oxygen Induction Type: IV induction Ventilation: Mask ventilation without difficulty Laryngoscope Size: Miller and 2 Grade View: Grade I Tube type: Oral Number of attempts: 1 Airway Equipment and Method: Stylet and Oral airway Placement Confirmation: ETT inserted through vocal cords under direct vision, positive ETCO2 and breath sounds checked- equal and bilateral Secured at: 20 cm Tube secured with: Tape Dental Injury: Teeth and Oropharynx as per pre-operative assessment  Comments: Airway by Sherron Flemings

## 2023-07-28 ENCOUNTER — Encounter (HOSPITAL_COMMUNITY): Payer: Self-pay | Admitting: Otolaryngology

## 2023-07-28 LAB — SURGICAL PATHOLOGY

## 2023-09-11 ENCOUNTER — Other Ambulatory Visit: Payer: Self-pay | Admitting: Otolaryngology

## 2023-09-16 ENCOUNTER — Encounter (INDEPENDENT_AMBULATORY_CARE_PROVIDER_SITE_OTHER): Payer: Self-pay

## 2023-09-23 NOTE — Progress Notes (Signed)
 SDW INSTRUCTIONS given:   Your procedure is scheduled on Sep 24, 2023.             Report to [facility] Main Entrance "A" at 8:15 A.M., and check in at the Admitting office.             Call this number if you have problems the morning of surgery:             [phone]               Remember:             Do not eat after midnight the night before your surgery   You may drink clear liquids until 7:45 the morning of your surgery.   Clear liquids allowed are: Water, Non-Citrus Juices (without pulp), Carbonated Beverages, Clear Tea, Black Coffee Only, and Gatorade                          Take these medicines the morning of surgery with A SIP OF WATER  acetaminophen  (TYLENOL )  albuterol  (PROVENTIL )  amLODipine  (NORVASC )  budesonide-formoterol  (SYMBICORT)  buPROPion  (WELLBUTRIN  XL)  clonazePAM  (KLONOPIN )  DULoxetine  (CYMBALTA )  gabapentin  (NEURONTIN )  naloxone (NARCAN)  oxyCODONE  (OXY IR/ROXICODONE )  (TRINTELLIX )      As of today, STOP taking any Aspirin (unless otherwise instructed by your surgeon) Aleve, Naproxen, Ibuprofen , Motrin , Advil , Goody's, BC's, all herbal medications, fish oil, and all vitamins.                       Do not wear jewelry, make up, or nail polish            Do not wear lotions, powders, perfumes/colognes, or deodorant.            Do not shave 48 hours prior to surgery.  Men may shave face and neck.            Do not bring valuables to the hospital.            [facility] is not responsible for any belongings or valuables.   Do NOT Smoke (Tobacco/Vaping) 24 hours prior to your procedure If you use a CPAP at night, you may bring all equipment for your overnight stay.   Contacts, glasses, dentures or bridgework may not be worn into surgery.      For patients admitted to the hospital, discharge time will be determined by your treatment team.   Patients discharged the day of surgery will not be allowed to drive home, and someone needs to stay with them  for 24 hours.       Special instructions:   [place]- Preparing For Surgery   Before surgery, you can play an important role. Because skin is not sterile, your skin needs to be as free of germs as possible. You can reduce the number of germs on your skin by washing with CHG (chlorahexidine gluconate) Soap before surgery.  CHG is an antiseptic cleaner which kills germs and bonds with the skin to continue killing germs even after washing.     Oral Hygiene is also important to reduce your risk of infection.  Remember - BRUSH YOUR TEETH THE MORNING OF SURGERY WITH YOUR REGULAR TOOTHPASTE   Please do not use if you have an allergy to CHG or antibacterial soaps. If your skin becomes reddened/irritated stop using the CHG.  Do not shave (including legs and underarms) for at  least 48 hours prior to first CHG shower. It is OK to shave your face.   Please follow these instructions carefully.              Shower the NIGHT BEFORE SURGERY and the MORNING OF SURGERY with DIAL Soap.    Pat yourself dry with a CLEAN TOWEL.   Wear CLEAN PAJAMAS to bed the night before surgery   Place CLEAN SHEETS on your bed the night of your first shower and DO NOT SLEEP WITH PETS.     Day of Surgery: Please shower morning of surgery  Wear Clean/Comfortable clothing the morning of surgery Do not apply any deodorants/lotions.   Remember to brush your teeth WITH YOUR REGULAR TOOTHPASTE.

## 2023-09-23 NOTE — Progress Notes (Signed)
 -------------  SDW INSTRUCTIONS given:  Your procedure is scheduled on Sep 24, 2023.  Report to [facility] Main Entrance "A" at 8:15 A.M., and check in at the Admitting office.  Call this number if you have problems the morning of surgery:  [phone]   Remember:  Do not eat after midnight the night before your surgery  You may drink clear liquids until 7:45 the morning of your surgery.   Clear liquids allowed are: Water, Non-Citrus Juices (without pulp), Carbonated Beverages, Clear Tea, Black Coffee Only, and Gatorade    Take these medicines the morning of surgery with A SIP OF WATER  acetaminophen  (TYLENOL )  albuterol  (PROVENTIL )  amLODipine  (NORVASC )  budesonide-formoterol  (SYMBICORT)  buPROPion  (WELLBUTRIN  XL)  clonazePAM  (KLONOPIN )  DULoxetine  (CYMBALTA )  gabapentin  (NEURONTIN )  naloxone (NARCAN)  oxyCODONE  (OXY IR/ROXICODONE )  (TRINTELLIX )    As of today, STOP taking any Aspirin (unless otherwise instructed by your surgeon) Aleve, Naproxen, Ibuprofen , Motrin , Advil , Goody's, BC's, all herbal medications, fish oil, and all vitamins.                      Do not wear jewelry, make up, or nail polish            Do not wear lotions, powders, perfumes/colognes, or deodorant.            Do not shave 48 hours prior to surgery.  Men may shave face and neck.            Do not bring valuables to the hospital.            [facility] is not responsible for any belongings or valuables.  Do NOT Smoke (Tobacco/Vaping) 24 hours prior to your procedure If you use a CPAP at night, you may bring all equipment for your overnight stay.   Contacts, glasses, dentures or bridgework may not be worn into surgery.      For patients admitted to the hospital, discharge time will be determined by your treatment team.   Patients discharged the day of surgery will not be allowed to drive home, and someone needs to stay with them for 24 hours.    Special instructions:   [place]-  Preparing For Surgery  Before surgery, you can play an important role. Because skin is not sterile, your skin needs to be as free of germs as possible. You can reduce the number of germs on your skin by washing with CHG (chlorahexidine gluconate) Soap before surgery.  CHG is an antiseptic cleaner which kills germs and bonds with the skin to continue killing germs even after washing.    Oral Hygiene is also important to reduce your risk of infection.  Remember - BRUSH YOUR TEETH THE MORNING OF SURGERY WITH YOUR REGULAR TOOTHPASTE  Please do not use if you have an allergy to CHG or antibacterial soaps. If your skin becomes reddened/irritated stop using the CHG.  Do not shave (including legs and underarms) for at least 48 hours prior to first CHG shower. It is OK to shave your face.  Please follow these instructions carefully.   Shower the NIGHT BEFORE SURGERY and the MORNING OF SURGERY with DIAL Soap.   Pat yourself dry with a CLEAN TOWEL.  Wear CLEAN PAJAMAS to bed the night before surgery  Place CLEAN SHEETS on your bed the night of your first shower and DO NOT SLEEP WITH PETS.   Day of Surgery: Please shower morning of surgery  Wear  Clean/Comfortable clothing the morning of surgery Do not apply any deodorants/lotions.   Remember to brush your teeth WITH YOUR REGULAR TOOTHPASTE.   Questions were answered. Patient verbalized understanding of instructions.

## 2023-09-24 ENCOUNTER — Ambulatory Visit (HOSPITAL_COMMUNITY): Payer: Self-pay | Admitting: Physician Assistant

## 2023-09-24 ENCOUNTER — Other Ambulatory Visit: Payer: Self-pay

## 2023-09-24 ENCOUNTER — Ambulatory Visit (HOSPITAL_COMMUNITY)
Admission: RE | Admit: 2023-09-24 | Discharge: 2023-09-24 | Disposition: A | Discharge status: Skilled Nursing Facility | Attending: Otolaryngology | Admitting: Otolaryngology

## 2023-09-24 ENCOUNTER — Encounter (HOSPITAL_COMMUNITY)
Admission: RE | Disposition: A | Discharge status: Skilled Nursing Facility | Payer: Self-pay | Source: Home / Self Care | Attending: Otolaryngology

## 2023-09-24 ENCOUNTER — Encounter (HOSPITAL_COMMUNITY): Payer: Self-pay | Admitting: Otolaryngology

## 2023-09-24 DIAGNOSIS — M950 Acquired deformity of nose: Secondary | ICD-10-CM | POA: Insufficient documentation

## 2023-09-24 DIAGNOSIS — I110 Hypertensive heart disease with heart failure: Secondary | ICD-10-CM | POA: Diagnosis not present

## 2023-09-24 DIAGNOSIS — E66813 Obesity, class 3: Secondary | ICD-10-CM | POA: Diagnosis not present

## 2023-09-24 DIAGNOSIS — I509 Heart failure, unspecified: Secondary | ICD-10-CM | POA: Insufficient documentation

## 2023-09-24 DIAGNOSIS — F32A Depression, unspecified: Secondary | ICD-10-CM | POA: Diagnosis not present

## 2023-09-24 DIAGNOSIS — F419 Anxiety disorder, unspecified: Secondary | ICD-10-CM | POA: Insufficient documentation

## 2023-09-24 DIAGNOSIS — C44311 Basal cell carcinoma of skin of nose: Secondary | ICD-10-CM | POA: Insufficient documentation

## 2023-09-24 DIAGNOSIS — I739 Peripheral vascular disease, unspecified: Secondary | ICD-10-CM | POA: Diagnosis not present

## 2023-09-24 DIAGNOSIS — G4730 Sleep apnea, unspecified: Secondary | ICD-10-CM | POA: Diagnosis not present

## 2023-09-24 DIAGNOSIS — G4733 Obstructive sleep apnea (adult) (pediatric): Secondary | ICD-10-CM | POA: Diagnosis not present

## 2023-09-24 DIAGNOSIS — Z6841 Body Mass Index (BMI) 40.0 and over, adult: Secondary | ICD-10-CM | POA: Insufficient documentation

## 2023-09-24 DIAGNOSIS — Z87891 Personal history of nicotine dependence: Secondary | ICD-10-CM | POA: Insufficient documentation

## 2023-09-24 DIAGNOSIS — J449 Chronic obstructive pulmonary disease, unspecified: Secondary | ICD-10-CM | POA: Insufficient documentation

## 2023-09-24 HISTORY — PX: NASAL FLAP ROTATION: SHX5366

## 2023-09-24 LAB — CBC
HCT: 42.5 % (ref 36.0–46.0)
Hemoglobin: 13.4 g/dL (ref 12.0–15.0)
MCH: 26.3 pg (ref 26.0–34.0)
MCHC: 31.5 g/dL (ref 30.0–36.0)
MCV: 83.5 fL (ref 80.0–100.0)
Platelets: 228 10*3/uL (ref 150–400)
RBC: 5.09 MIL/uL (ref 3.87–5.11)
RDW: 14.5 % (ref 11.5–15.5)
WBC: 7.6 10*3/uL (ref 4.0–10.5)
nRBC: 0 % (ref 0.0–0.2)

## 2023-09-24 LAB — BASIC METABOLIC PANEL WITH GFR
Anion gap: 9 (ref 5–15)
BUN: 18 mg/dL (ref 8–23)
CO2: 28 mmol/L (ref 22–32)
Calcium: 9.4 mg/dL (ref 8.9–10.3)
Chloride: 101 mmol/L (ref 98–111)
Creatinine, Ser: 1.08 mg/dL — ABNORMAL HIGH (ref 0.44–1.00)
GFR, Estimated: 55 mL/min — ABNORMAL LOW (ref >60)
Glucose, Bld: 113 mg/dL — ABNORMAL HIGH (ref 70–99)
Potassium: 4.3 mmol/L (ref 3.5–5.1)
Sodium: 138 mmol/L (ref 135–145)

## 2023-09-24 LAB — POCT I-STAT, CHEM 8
BUN: 30 mg/dL — ABNORMAL HIGH (ref 8–23)
Calcium, Ion: 1.12 mmol/L — ABNORMAL LOW (ref 1.15–1.40)
Chloride: 104 mmol/L (ref 98–111)
Creatinine, Ser: 1.1 mg/dL — ABNORMAL HIGH (ref 0.44–1.00)
Glucose, Bld: 102 mg/dL — ABNORMAL HIGH (ref 70–99)
HCT: 43 % (ref 36.0–46.0)
Hemoglobin: 14.6 g/dL (ref 12.0–15.0)
Potassium: 8.2 mmol/L (ref 3.5–5.1)
Sodium: 136 mmol/L (ref 135–145)
TCO2: 29 mmol/L (ref 22–32)

## 2023-09-24 SURGERY — CREATION, FLAP, PEDICLED ROTATION, FOREHEAD AND NOSE
Anesthesia: General | Site: Nose | Laterality: Right

## 2023-09-24 MED ORDER — MIDAZOLAM HCL 2 MG/2ML IJ SOLN
INTRAMUSCULAR | Status: AC
  Filled 2023-09-24: qty 2

## 2023-09-24 MED ORDER — SUGAMMADEX SODIUM 200 MG/2ML IV SOLN
INTRAVENOUS | Status: DC | PRN
  Administered 2023-09-24: 200 mg via INTRAVENOUS

## 2023-09-24 MED ORDER — BACITRACIN ZINC 500 UNIT/GM EX OINT
TOPICAL_OINTMENT | CUTANEOUS | Status: AC
Start: 2023-09-24 — End: ?
  Filled 2023-09-24: qty 28.35

## 2023-09-24 MED ORDER — LIDOCAINE 2% (20 MG/ML) 5 ML SYRINGE
INTRAMUSCULAR | Status: DC | PRN
  Administered 2023-09-24: 40 mg via INTRAVENOUS

## 2023-09-24 MED ORDER — ROCURONIUM BROMIDE 10 MG/ML (PF) SYRINGE
PREFILLED_SYRINGE | INTRAVENOUS | Status: AC
  Filled 2023-09-24: qty 10

## 2023-09-24 MED ORDER — PROPOFOL 10 MG/ML IV BOLUS
INTRAVENOUS | Status: AC
  Filled 2023-09-24: qty 20

## 2023-09-24 MED ORDER — ONDANSETRON HCL 4 MG/2ML IJ SOLN
INTRAMUSCULAR | Status: DC | PRN
  Administered 2023-09-24: 4 mg via INTRAVENOUS

## 2023-09-24 MED ORDER — ORAL CARE MOUTH RINSE: 15.0000 mL | Freq: Once | OROMUCOSAL | Status: AC

## 2023-09-24 MED ORDER — LIDOCAINE 2% (20 MG/ML) 5 ML SYRINGE
INTRAMUSCULAR | Status: AC
  Filled 2023-09-24: qty 5

## 2023-09-24 MED ORDER — CHLORHEXIDINE GLUCONATE 0.12 % MT SOLN
15.0000 mL | Freq: Once | OROMUCOSAL | Status: AC
  Administered 2023-09-24: 15 mL via OROMUCOSAL
  Filled 2023-09-24: qty 15

## 2023-09-24 MED ORDER — EPINEPHRINE HCL (NASAL) 0.1 % NA SOLN
NASAL | Status: AC
  Filled 2023-09-24: qty 30

## 2023-09-24 MED ORDER — LIDOCAINE-EPINEPHRINE 1 %-1:100000 IJ SOLN
INTRAMUSCULAR | Status: DC | PRN
  Administered 2023-09-24: 1.5 mL

## 2023-09-24 MED ORDER — PROPOFOL 10 MG/ML IV BOLUS
INTRAVENOUS | Status: DC | PRN
  Administered 2023-09-24: 150 mg via INTRAVENOUS

## 2023-09-24 MED ORDER — ROCURONIUM BROMIDE 10 MG/ML (PF) SYRINGE
PREFILLED_SYRINGE | INTRAVENOUS | Status: DC | PRN
  Administered 2023-09-24: 60 mg via INTRAVENOUS

## 2023-09-24 MED ORDER — BACITRACIN ZINC 500 UNIT/GM EX OINT
TOPICAL_OINTMENT | CUTANEOUS | Status: DC | PRN
Start: 2023-09-24 — End: 2023-09-24
  Administered 2023-09-24: 1 via TOPICAL

## 2023-09-24 MED ORDER — CEFAZOLIN SODIUM-DEXTROSE 2-4 GM/100ML-% IV SOLN
2.0000 g | INTRAVENOUS | Status: AC
  Administered 2023-09-24: 2 g via INTRAVENOUS
  Filled 2023-09-24: qty 100

## 2023-09-24 MED ORDER — BSS IO SOLN
INTRAOCULAR | Status: DC | PRN
  Administered 2023-09-24: 15 mL via INTRAOCULAR

## 2023-09-24 MED ORDER — LACTATED RINGERS IV SOLN: INTRAVENOUS | Status: DC

## 2023-09-24 MED ORDER — OXYCODONE HCL 5 MG/5ML PO SOLN: 5.0000 mg | Freq: Once | ORAL | Status: DC | PRN

## 2023-09-24 MED ORDER — 0.9 % SODIUM CHLORIDE (POUR BTL) OPTIME
TOPICAL | Status: DC | PRN
  Administered 2023-09-24: 1000 mL

## 2023-09-24 MED ORDER — EPHEDRINE SULFATE-NACL 50-0.9 MG/10ML-% IV SOSY
PREFILLED_SYRINGE | INTRAVENOUS | Status: DC | PRN
  Administered 2023-09-24 (×4): 10 mg via INTRAVENOUS
  Administered 2023-09-24: 5 mg via INTRAVENOUS

## 2023-09-24 MED ORDER — DEXAMETHASONE SODIUM PHOSPHATE 10 MG/ML IJ SOLN
INTRAMUSCULAR | Status: AC
  Filled 2023-09-24: qty 1

## 2023-09-24 MED ORDER — FENTANYL CITRATE (PF) 100 MCG/2ML IJ SOLN: 25.0000 ug | INTRAMUSCULAR | Status: DC | PRN

## 2023-09-24 MED ORDER — OXYCODONE HCL 5 MG PO TABS: 5.0000 mg | ORAL_TABLET | Freq: Once | ORAL | Status: DC | PRN

## 2023-09-24 MED ORDER — BSS IO SOLN
INTRAOCULAR | Status: AC
Start: 2023-09-24 — End: ?
  Filled 2023-09-24: qty 15

## 2023-09-24 MED ORDER — ONDANSETRON HCL 4 MG/2ML IJ SOLN: 4.0000 mg | Freq: Once | INTRAMUSCULAR | Status: DC | PRN

## 2023-09-24 MED ORDER — FENTANYL CITRATE (PF) 250 MCG/5ML IJ SOLN
INTRAMUSCULAR | Status: DC | PRN
  Administered 2023-09-24: 50 ug via INTRAVENOUS
  Administered 2023-09-24 (×2): 25 ug via INTRAVENOUS

## 2023-09-24 MED ORDER — DEXAMETHASONE SODIUM PHOSPHATE 10 MG/ML IJ SOLN
INTRAMUSCULAR | Status: DC | PRN
  Administered 2023-09-24: 10 mg via INTRAVENOUS

## 2023-09-24 MED ORDER — FENTANYL CITRATE (PF) 250 MCG/5ML IJ SOLN
INTRAMUSCULAR | Status: AC
  Filled 2023-09-24: qty 5

## 2023-09-24 MED ORDER — ALBUMIN HUMAN 5 % IV SOLN: INTRAVENOUS | Status: DC | PRN

## 2023-09-24 MED ORDER — LIDOCAINE-EPINEPHRINE 1 %-1:100000 IJ SOLN
INTRAMUSCULAR | Status: AC
  Filled 2023-09-24: qty 1

## 2023-09-24 MED ORDER — ONDANSETRON HCL 4 MG/2ML IJ SOLN
INTRAMUSCULAR | Status: AC
  Filled 2023-09-24: qty 2

## 2023-09-24 SURGICAL SUPPLY — 57 items
APPLICATOR DR MATTHEWS STRL (MISCELLANEOUS) ×2 IMPLANT
BAG COUNTER SPONGE SURGICOUNT (BAG) ×2 IMPLANT
BETADINE 5% OPHTHALMIC (OPHTHALMIC) ×2 IMPLANT
BLADE SURG 15 STRL LF DISP TIS (BLADE) ×2 IMPLANT
CANISTER SUCTION 3000ML PPV (SUCTIONS) ×2 IMPLANT
CLEANER TIP ELECTROSURG 2X2 (MISCELLANEOUS) ×2 IMPLANT
CNTNR URN SCR LID CUP LEK RST (MISCELLANEOUS) ×1 IMPLANT
CORD BIPOLAR FORCEPS 12FT (ELECTRODE) IMPLANT
COVER SURGICAL LIGHT HANDLE (MISCELLANEOUS) ×2 IMPLANT
DRAPE HALF SHEET 40X57 (DRAPES) ×1 IMPLANT
DRESSING NASAL POPE 10X1.5X2.5 (GAUZE/BANDAGES/DRESSINGS) ×1 IMPLANT
DRSG TEGADERM 2-3/8X2-3/4 SM (GAUZE/BANDAGES/DRESSINGS) ×3 IMPLANT
DRSG TELFA 3X8 NADH STRL (GAUZE/BANDAGES/DRESSINGS) ×2 IMPLANT
ELECT COATED BLADE 2.86 ST (ELECTRODE) ×2 IMPLANT
ELECT NDL BLADE 2-5/6 (NEEDLE) ×1 IMPLANT
ELECT NEEDLE BLADE 2-5/6 (NEEDLE) IMPLANT
ELECTRODE COLORADO STRAIGHT 4 (ELECTROSURGICAL) IMPLANT
ELECTRODE REM PT RTRN 9FT ADLT (ELECTROSURGICAL) ×2 IMPLANT
GAUZE 4X4 16PLY ~~LOC~~+RFID DBL (SPONGE) ×2 IMPLANT
GAUZE XEROFORM 1X8 LF (GAUZE/BANDAGES/DRESSINGS) ×1 IMPLANT
GLOVE BIO SURGEON STRL SZ7.5 (GLOVE) ×2 IMPLANT
GLOVE BIOGEL PI IND STRL 8 (GLOVE) ×2 IMPLANT
GOWN STRL REUS W/ TWL LRG LVL3 (GOWN DISPOSABLE) ×2 IMPLANT
GOWN STRL REUS W/ TWL XL LVL3 (GOWN DISPOSABLE) ×2 IMPLANT
KIT BASIN OR (CUSTOM PROCEDURE TRAY) ×2 IMPLANT
KIT TURNOVER KIT B (KITS) ×2 IMPLANT
MARKER SKIN DUAL TIP RULER LAB (MISCELLANEOUS) ×3 IMPLANT
NDL HYPO 30X.5 LL (NEEDLE) ×1 IMPLANT
NDL PRECISIONGLIDE 27X1.5 (NEEDLE) ×1 IMPLANT
NEEDLE HYPO 30X.5 LL (NEEDLE) IMPLANT
NEEDLE PRECISIONGLIDE 27X1.5 (NEEDLE) ×2 IMPLANT
NS IRRIG 1000ML POUR BTL (IV SOLUTION) ×2 IMPLANT
PAD ARMBOARD POSITIONER FOAM (MISCELLANEOUS) ×4 IMPLANT
PATTIES SURGICAL .5 X3 (DISPOSABLE) IMPLANT
PENCIL SMOKE EVACUATOR (MISCELLANEOUS) ×2 IMPLANT
POSITIONER HEAD DONUT 9IN (MISCELLANEOUS) ×2 IMPLANT
SOL PREP POV-IOD 4OZ 10% (MISCELLANEOUS) ×1 IMPLANT
SPONGE T-LAP 18X18 ~~LOC~~+RFID (SPONGE) ×1 IMPLANT
STAPLER SKIN PROX 35W (STAPLE) ×1 IMPLANT
SURGIFLO W/THROMBIN 8M KIT (HEMOSTASIS) IMPLANT
SUT CHROMIC 4 0 PS 2 18 (SUTURE) IMPLANT
SUT CHROMIC 4 0 RB 1X27 (SUTURE) IMPLANT
SUT CHROMIC 4 0 SH 27 (SUTURE) IMPLANT
SUT CHROMIC 5 0 P 3 (SUTURE) ×1 IMPLANT
SUT CHROMIC 5 0 RB 1 27 (SUTURE) IMPLANT
SUT ETHILON 5 0 PS 2 18 (SUTURE) ×1 IMPLANT
SUT ETHILON 6 0 P 1 (SUTURE) ×1 IMPLANT
SUT MON AB 5-0 P3 18 (SUTURE) ×1 IMPLANT
SUT SILK 4 0 CR 8 RB 1 (SUTURE) IMPLANT
SUT VIC AB 3-0 X1 27 (SUTURE) IMPLANT
SUT VIC AB 4-0 PS2 18 (SUTURE) IMPLANT
SUT VIC AB 4-0 PS2 27 (SUTURE) IMPLANT
SYR CONTROL 10ML LL (SYRINGE) ×2 IMPLANT
TOWEL GREEN STERILE FF (TOWEL DISPOSABLE) ×2 IMPLANT
TRAY ENT MC OR (CUSTOM PROCEDURE TRAY) ×2 IMPLANT
TUBE CONNECTING 12X1/4 (SUCTIONS) ×1 IMPLANT
WATER STERILE IRR 1000ML POUR (IV SOLUTION) ×2 IMPLANT

## 2023-09-24 NOTE — Anesthesia Postprocedure Evaluation (Signed)
 Anesthesia Post Note  Patient: [name]  Procedure(s) Performed: DIVISION AND INSET OF PEDICLE FOREHEAD FLAP TO NOSE (Right: Nose)     Patient location during evaluation: PACU Anesthesia Type: General Level of consciousness: awake and alert and oriented Pain management: pain level controlled Vital Signs Assessment: post-procedure vital signs reviewed and stable Respiratory status: spontaneous breathing, nonlabored ventilation and respiratory function stable Cardiovascular status: blood pressure returned to baseline and stable Postop Assessment: no apparent nausea or vomiting Anesthetic complications: no   No notable events documented.  Last Vitals:  Vitals:   09/24/23 1430 09/24/23 1500  BP:  (!) 141/73  Pulse:  (!) 51  Resp:  11  Temp: (!) 36.2 C 36.7 C  SpO2:  95%    Last Pain:  Vitals:   09/24/23 1500  TempSrc:   PainSc: 0-No pain                 Taylin Leder A.

## 2023-09-24 NOTE — Op Note (Signed)
 OPERATIVE NOTE  NIAOMI CARTAYA Date/Time of Admission: 09/24/2023  8:13 AM  CSN: [id number];MRN:[id number] Attending Provider: Rush Coupe, MD Room/Bed: MCPO/NONE DOB: [birth year] Age: 73 y.o.   Pre-Op Diagnosis: Basal cell carcinoma of nasal tip; Malignant neoplasm of scalp  Post-Op Diagnosis: Basal cell carcinoma of nasal tip; Malignant neoplasm of scalp  Procedure: Procedure(s): DIVISION AND INSET OF PEDICLE FOREHEAD FLAP TO NOSE (CPT 15630)  Anesthesia: General  Surgeon(s): Marijane Shoulders, MD  Staff: Circulator: Bartley Lightning, RN Relief Scrub: Melinda Sprawls, RN Scrub Person: Perez-Vasquez, Tiffany  Implants: * No implants in log *  Specimens: * No specimens in log *  Complications: none  EBL: 20 ML  IVF: Per anesthesia ML  Condition: stable  Operative Findings:  Viable forehead flap after division of pedicle Stable vertex scalp 3cm basal cell carcinoma (patient has elected to defer treatment for now)  Description of Operation:  The patient was identified in the preoperative area and consent confirmed in the chart.  The patient was brought to the operating room by the anesthetist and a preoperative huddle was performed confirming the patient's identity and procedure to be performed.  Once all were in agreement we proceeded with surgery.  General anesthesia was induced and the patient was intubated with a laryngeal mask airway.  This was secured and patient was then turned 180 degrees from the anesthetist.  The patient's bandages were removed demonstrating the findings as noted above.  The wounds were cleansed on the forehead and the nasal recipient site were anesthetized with lidocaine  and epinephrine .   The patient was prepped and draped in standard fashion for procedure of this kind.  Final preoperative pause was performed and we proceeded with surgery.   We began by clamping the forehead flap interpolated pedicle with a hemostat.  There was adequate  perfusion and good capillary refill of the forehead flap despite clamping the pedicle.  Given this the forehead flap pedicle was divided sharply with a 15 blade.  Double-pronged skin hooks were used to sharply incise the forehead flap proximal stump at the eyebrow creating a subcutaneous plane for closure.  The frontalis and deeper musculature and granulation tissues were discarded.  A small V wedge was created in the forehead flap scar closure site to allow for inset of the eyebrow.  The brow was then inset with buried interrupted 5-0 Monocryl and interrupted 6-0 nylon for an aesthetic closure.   Next we proceeded with insetting the forehead flap along the dorsum of the nasal Mohs defect.  Again a 15 blade and appropriate tension with double-pronged skin hooks were used to incise the flaps of the flap laterally excising the frontalis musculature beneath the subcutaneous plane.  The recipient site was sharply incised to create fresh edges for closure.  Next the forehead flap was adequately trimmed and contoured to the recipient site and closed with buried interrupted 5-0 Monocryl and interrupted 6-0 nylon sutures.    There was distal flap discontinuity from prior flap necrosis between the tip and the alar lobule. The opposing edges were sharply incised with a 15 blade and closed with interrupted 5-0 chromic gut sutures.   The wounds were dressed with bacitracin , telfa, and brown paper tape.   The procedures were completed without complication and tolerated well. The patient was released in the company of their family and returned home in satisfactory condition.  A follow-up appointment was scheduled, routine post-op medications prescribed, and post-op instructions given to the responsible party.  Marijane Shoulders, MD [facility]  09/24/2023

## 2023-09-24 NOTE — H&P (Signed)
 [name] is an 73 y.o. female.    Chief Complaint:  acquired nasal deformity  HPI: Patient presents today for planned elective procedure.  He/she denies any interval change in history since office visit on 09/08/23.   Past Medical History:  Diagnosis Date   Abdominal aortic atherosclerosis (HCC) 11/11/2016   ADHD    Anxiety    COPD (chronic obstructive pulmonary disease) (HCC)    Depression    major depressive   Dyspnea    doe   Edema    left leg   Follicular lymphoma (HCC)    B Cell   Follicular lymphoma grade II (HCC)    Hypertension    Hypotension    idiopathic   Kyphoscoliosis and scoliosis 11/26/2011   Morbid obesity (HCC) 01/05/2016   Multiple sclerosis (HCC)    Multiple sclerosis (HCC)    1980's   Neuromuscular disorder (HCC)    Obstructive and reflux uropathy    foley   Pain    atypical facial   Peripheral vascular disease (HCC)    Peripheral vascular disease of lower extremity with ulceration (HCC) 11/08/2015   Skin ulcer (HCC) 11/08/2015   Sleep apnea    per pt report   Weakness    generalized. has MS    Past Surgical History:  Procedure Laterality Date   AMPUTATION Left 02/01/2022   Procedure: AMPUTATION BELOW KNEE;  Surgeon: Celso College, MD;  Location: [facility] ORS;  Service: Vascular;  Laterality: Left;   BACK SURGERY N/A 2002   BONE BIOPSY Left 12/24/2021   Procedure: BONE BIOPSY-HEEL BONE;  Surgeon: Charity Conch, DPM;  Location: [facility] ORS;  Service: Podiatry;  Laterality: Left;   CYST EXCISION     lower back   EXCISION MASS HEAD Right 07/25/2023   Procedure: EXCISION AND REPAIR OF RIGHT NOSE MOHS DEFECT;  Surgeon: Rush Coupe, MD;  Location: [facility] OR;  Service: ENT;  Laterality: Right;   INGUINAL LYMPH NODE BIOPSY Left 07/04/2016   Procedure: INGUINAL LYMPH NODE BIOPSY;  Surgeon: Jerlean Mood, MD;  Location: [facility] ORS;  Service: General;  Laterality: Left;   NASAL FLAP ROTATION Right 07/25/2023   Procedure: POSSIBLE NASOLABIAL FLAP;  POSSIBLE PARAMEDIAN FOREHEAD FLAP;  Surgeon: Rush Coupe, MD;  Location: [facility] OR;  Service: ENT;  Laterality: Right;   ORIF FEMUR FRACTURE Left 02/04/2020   Procedure: OPEN REDUCTION INTERNAL FIXATION (ORIF) DISTAL FEMUR FRACTURE;  Surgeon: Laneta Pintos, MD;  Location: [facility] OR;  Service: Orthopedics;  Laterality: Left;   PORTACATH PLACEMENT N/A 07/22/2016   Procedure: INSERTION PORT-A-CATH;  Surgeon: Jerlean Mood, MD;  Location: [facility] ORS;  Service: General;  Laterality: N/A;   SKIN FULL THICKNESS GRAFT Right 07/25/2023   Procedure: ADJACENT TISSUE TRANSFER; POSSIBLE SKIN GRAFT FROM RIGHT EAR OR FOREHEAD; POSSIBLE RIGHT EAR CARTILAGE GRAFT;  Surgeon: Rush Coupe, MD;  Location: [facility] OR;  Service: ENT;  Laterality: Right;   TONSILLECTOMY AND ADENOIDECTOMY     TUBAL LIGATION     WOUND DEBRIDEMENT Left 12/24/2021   Procedure: DEBRIDEMENT WOUND-HEEL ULCER;  Surgeon: Charity Conch, DPM;  Location: [facility] ORS;  Service: Podiatry;  Laterality: Left;    Family History  Problem Relation Age of Onset   COPD Mother    Diabetes Mother    Heart failure Mother    Alcohol abuse Father    Kidney disease Father    Kidney failure Father    Arthritis Sister    CAD Maternal Grandmother    Stroke Maternal  Grandfather    Arthritis Sister    Mental illness Sister    Arthritis Brother     Social History:  reports that she quit smoking about 7 years ago. Her smoking use included cigarettes. She started smoking about 28 years ago. She has a 20.8 pack-year smoking history. She has never used smokeless tobacco. She reports that she does not currently use drugs. She reports that she does not drink alcohol.  Allergies: No Known Allergies  Medications Prior to Admission  Medication Sig Dispense Refill   acetaminophen  (TYLENOL ) 500 MG tablet Take 1,000 mg by mouth every 8 (eight) hours as needed for moderate pain.     albuterol  (PROVENTIL ) (2.5 MG/3ML) 0.083% nebulizer solution Take 2.5 mg by  nebulization every 4 (four) hours as needed for wheezing or shortness of breath.     amLODipine  (NORVASC ) 5 MG tablet Take 5 mg by mouth daily.     budesonide-formoterol  (SYMBICORT) 160-4.5 MCG/ACT inhaler Inhale 2 puffs into the lungs 2 (two) times daily.      buPROPion  (WELLBUTRIN  XL) 300 MG 24 hr tablet Take 300 mg by mouth daily.     chlorhexidine  (HIBICLENS ) 4 % external liquid Apply 1 Application topically daily.     cholecalciferol (VITAMIN D3) 25 MCG (1000 UNIT) tablet Take 2,000 Units by mouth daily.     clonazePAM  (KLONOPIN ) 0.5 MG tablet Take 1 tablet (0.5 mg total) by mouth 2 (two) times daily as needed for anxiety. Home med. (Patient taking differently: Take 0.5 mg by mouth 2 (two) times daily. Home med.) 10 tablet 0   Cranberry 400 MG CAPS Take 400 mg by mouth 2 (two) times daily.     docusate sodium  (COLACE) 100 MG capsule Take 1 capsule (100 mg total) by mouth 2 (two) times daily as needed. (Patient taking differently: Take 100 mg by mouth 2 (two) times daily.)     DULoxetine  (CYMBALTA ) 60 MG capsule Take 1 capsule (60 mg total) by mouth every morning. 30 capsule 3   gabapentin  (NEURONTIN ) 600 MG tablet Take 600 mg by mouth 3 (three) times daily.     hydrOXYzine (ATARAX) 25 MG tablet Take 50 mg by mouth at bedtime.     magnesium  oxide (MAG-OX) 400 (240 Mg) MG tablet Take 400 mg by mouth daily.     Multiple Vitamin (MULTIVITAMIN WITH MINERALS) TABS tablet Take 1 tablet by mouth daily.     naloxone (NARCAN) nasal spray 4 mg/0.1 mL Place 1 spray into the nose 3 (three) times daily as needed (symptoms of opioid overdose).     oxyCODONE  (OXY IR/ROXICODONE ) 5 MG immediate release tablet Take 1 tablet (5 mg total) by mouth every 8 (eight) hours as needed for moderate pain or severe pain. Home med. 10 tablet 0   QUEtiapine  Fumarate 150 MG TABS Take 150 mg by mouth at bedtime.     traZODone  (DESYREL ) 50 MG tablet To be resumed by facility physician as deemed appropriate. (Patient taking  differently: Take 50 mg by mouth at bedtime.)     triamcinolone  cream (KENALOG ) 0.1 % Apply 1 Application topically 2 (two) times daily.     vortioxetine  HBr (TRINTELLIX ) 5 MG TABS tablet Duplicate psych med.  To be resumed by facility physician as deemed appropriate. (Patient taking differently: Take 5 mg by mouth daily.) 30 tablet     No results found for this or any previous visit (from the past 48 hours). No results found.  ROS: negative other than stated in HPI  There  were no vitals taken for this visit.  PHYSICAL EXAM: General: Resting comfortably in NAD  Lungs: Non-labored respiratinos  Studies Reviewed: na   Assessment/Plan Acquired nasal deformity Basal cell carcinoma skin of nose  Proceed with division and inset of forehead flap. Informed consent obtained. RBA discussed.    Electronically signed by:  Rush Coupe, MD  Staff Physician Facial Plastic & Reconstructive Surgery Otolaryngology - Head and Neck Surgery [facility]  09/24/2023, 8:27 AM

## 2023-09-24 NOTE — Anesthesia Preprocedure Evaluation (Signed)
 Anesthesia Evaluation  Patient identified by MRN, date of birth, ID band Patient awake    Reviewed: Allergy & Precautions, NPO status , Patient's Chart, lab work & pertinent test results  Airway Mallampati: II  TM Distance: >3 FB     Dental no notable dental hx. (+) Dental Advisory Given   Pulmonary shortness of breath, with exertion and at rest, sleep apnea and Continuous Positive Airway Pressure Ventilation , COPD,  COPD inhaler, former smoker   breath sounds clear to auscultation + decreased breath sounds      Cardiovascular hypertension, + Peripheral Vascular Disease and +CHF  Normal cardiovascular exam Rhythm:Regular Rate:Normal     Neuro/Psych  PSYCHIATRIC DISORDERS Anxiety Depression     Neuromuscular disease    GI/Hepatic negative GI ROS, Neg liver ROS,,,  Endo/Other    Class 3 obesity  Renal/GU Renal InsufficiencyRenal disease     Musculoskeletal negative musculoskeletal ROS (+)    Abdominal  (+) + obese  Peds  Hematology   Anesthesia Other Findings   Reproductive/Obstetrics                              Anesthesia Physical Anesthesia Plan  ASA: 3  Anesthesia Plan: General   Post-op Pain Management: Minimal or no pain anticipated   Induction: Intravenous  PONV Risk Score and Plan: 4 or greater and Treatment may vary due to age or medical condition, Ondansetron  and Dexamethasone   Airway Management Planned: Oral ETT  Additional Equipment: None  Intra-op Plan:   Post-operative Plan: Extubation in OR  Informed Consent: I have reviewed the patients History and Physical, chart, labs and discussed the procedure including the risks, benefits and alternatives for the proposed anesthesia with the patient or authorized representative who has indicated his/her understanding and acceptance.     Dental advisory given  Plan Discussed with: Anesthesiologist and CRNA  Anesthesia  Plan Comments:          Anesthesia Quick Evaluation

## 2023-09-24 NOTE — Anesthesia Procedure Notes (Signed)
 Procedure Name: Intubation Date/Time: 09/24/2023 12:51 PM  Performed by: Johann Muta, CRNAPre-anesthesia Checklist: Patient identified, Emergency Drugs available, Suction available and Patient being monitored Patient Re-evaluated:Patient Re-evaluated prior to induction Oxygen Delivery Method: Circle system utilized Preoxygenation: Pre-oxygenation with 100% oxygen Induction Type: IV induction Ventilation: Mask ventilation without difficulty Laryngoscope Size: Glidescope and 3 Grade View: Grade I Tube type: Oral Tube size: 7.0 mm Number of attempts: 1 Airway Equipment and Method: Stylet and Oral airway Placement Confirmation: ETT inserted through vocal cords under direct vision, positive ETCO2 and breath sounds checked- equal and bilateral Secured at: 22 cm Tube secured with: Tape Dental Injury: Teeth and Oropharynx as per pre-operative assessment

## 2023-09-24 NOTE — Discharge Instructions (Addendum)
 Post-operative Patient Instructions [name]. Hoshal MD  Surgery What to expect: A bandage will be placed on your surgical sites. You can leave the bandages in place until you return to the clinic. You may be scheduled for a series of wound care appointments over the next month.  Recovery/Restrictions: -No strenuous activity for at least the first week after your procedure -Bruising and swelling are expected and will take weeks to go away (consider using ice packs) -Please contact our office immediately if you experience any signs/symptoms of infection (redness, pain, or fever of 100.15F or greater)  Wound Wound care: The goal is to keep your wounds clean and moist to prevent scabs or crusts. You will keep your current post-operative dressing in place undisturbed until after your first post-operative clinic visit. The following instructions apply after your first post-operative visit.  1. Clean wound wounds with soap and water using a cue tip if any crusts are present  2. Next, apply a thin layer of Aquaphor ointment 3. Apply Telfa and cover with brown tape  4. If you had an ear surgery - please apply a thin layer of antibiotic ointment or Aquaphor ointment to your ear incision every day  Care Healing Period: For the best healing, please protect the area from the sun   You may be asked to begin massaging the scars several weeks after surgery. Scars can be massaged in horizontal, vertical, and circular motions.   Adult Post-Operative Pain Management  Pain medication is given immediately following your surgery to help with post-operative pain. Do not wake up or set an alarm to wake up and take pain medications. Sleep and rest.  Upon your discharge home, we suggest scheduled doses of Acetaminophen  (Tylenol ) every 6 hours and Ibuprofen  (Motrin ) every 6 hours, alternating between medications every 3 hours (i.e. Take Tylenol  and wait 3 hours, then take Motrin  and wait 3 hours, repeat) for the  first 3-4 days after surgery. If you are without significant pain, medications can be taken more infrequently. It is important to follow dosing instructions on the medication bottle or prescription.   Sample of medication dosing schedule  Give dose of: Time: Given:  Acetaminophen  12 a.m.   Ibuprofen  3 a.m.   Acetaminophen  6 a.m.   Ibuprofen  9 a.m.   Acetaminophen  12 p.m.   Ibuprofen  3 p.m.   Acetaminophen  6 p.m.   Ibuprofen  9 p.m.    If you need to call after clinic hours for a concern, call [phone] and ask for the "physician on call for ENT."  [address] [place], [place] [address] Phone: [phone]    Next Office appointment - October 01, 2023 @2 :15 pm

## 2023-09-24 NOTE — Transfer of Care (Signed)
 Immediate Anesthesia Transfer of Care Note  Patient: [name]  Procedure(s) Performed: DIVISION AND INSET OF PEDICLE FOREHEAD FLAP TO NOSE (Right: Nose)  Patient Location: PACU  Anesthesia Type:General  Level of Consciousness: awake and alert   Airway & Oxygen Therapy: Patient Spontanous Breathing and Patient connected to face mask oxygen  Post-op Assessment: Report given to RN and Post -op Vital signs reviewed and stable  Post vital signs: Reviewed and stable  Last Vitals:  Vitals Value Taken Time  BP 122/91 09/24/23 1430  Temp    Pulse 54 09/24/23 1433  Resp 14 09/24/23 1433  SpO2 97 % 09/24/23 1433  Vitals shown include unfiled device data.  Last Pain:  Vitals:   09/24/23 0925  TempSrc:   PainSc: 3          Complications: No notable events documented.

## 2023-09-25 ENCOUNTER — Encounter (HOSPITAL_COMMUNITY): Payer: Self-pay | Admitting: Otolaryngology

## 2023-10-01 ENCOUNTER — Inpatient Hospital Stay: Admitting: Oncology

## 2023-10-01 ENCOUNTER — Inpatient Hospital Stay

## 2023-10-20 ENCOUNTER — Telehealth: Payer: Self-pay | Admitting: Pharmacist

## 2023-10-20 ENCOUNTER — Inpatient Hospital Stay: Attending: Oncology | Admitting: Oncology

## 2023-10-20 ENCOUNTER — Telehealth: Payer: Self-pay | Admitting: Pharmacy Technician

## 2023-10-20 ENCOUNTER — Other Ambulatory Visit (HOSPITAL_COMMUNITY): Payer: Self-pay

## 2023-10-20 ENCOUNTER — Encounter: Payer: Self-pay | Admitting: Oncology

## 2023-10-20 ENCOUNTER — Other Ambulatory Visit: Payer: Self-pay | Admitting: *Deleted

## 2023-10-20 ENCOUNTER — Encounter: Payer: Self-pay | Admitting: *Deleted

## 2023-10-20 ENCOUNTER — Inpatient Hospital Stay

## 2023-10-20 VITALS — BP 113/70 | HR 54 | Temp 98.1°F | Resp 18 | Wt 276.2 lb

## 2023-10-20 DIAGNOSIS — Z833 Family history of diabetes mellitus: Secondary | ICD-10-CM | POA: Diagnosis not present

## 2023-10-20 DIAGNOSIS — Z89512 Acquired absence of left leg below knee: Secondary | ICD-10-CM | POA: Insufficient documentation

## 2023-10-20 DIAGNOSIS — Z8249 Family history of ischemic heart disease and other diseases of the circulatory system: Secondary | ICD-10-CM | POA: Insufficient documentation

## 2023-10-20 DIAGNOSIS — Z8261 Family history of arthritis: Secondary | ICD-10-CM | POA: Diagnosis not present

## 2023-10-20 DIAGNOSIS — Z79899 Other long term (current) drug therapy: Secondary | ICD-10-CM | POA: Diagnosis not present

## 2023-10-20 DIAGNOSIS — C4441 Basal cell carcinoma of skin of scalp and neck: Secondary | ICD-10-CM | POA: Insufficient documentation

## 2023-10-20 DIAGNOSIS — I10 Essential (primary) hypertension: Secondary | ICD-10-CM | POA: Insufficient documentation

## 2023-10-20 DIAGNOSIS — Z9089 Acquired absence of other organs: Secondary | ICD-10-CM | POA: Insufficient documentation

## 2023-10-20 DIAGNOSIS — C44310 Basal cell carcinoma of skin of unspecified parts of face: Secondary | ICD-10-CM

## 2023-10-20 DIAGNOSIS — J449 Chronic obstructive pulmonary disease, unspecified: Secondary | ICD-10-CM | POA: Diagnosis not present

## 2023-10-20 DIAGNOSIS — C44311 Basal cell carcinoma of skin of nose: Secondary | ICD-10-CM | POA: Insufficient documentation

## 2023-10-20 DIAGNOSIS — Z66 Do not resuscitate: Secondary | ICD-10-CM | POA: Insufficient documentation

## 2023-10-20 DIAGNOSIS — I739 Peripheral vascular disease, unspecified: Secondary | ICD-10-CM | POA: Insufficient documentation

## 2023-10-20 DIAGNOSIS — Z87891 Personal history of nicotine dependence: Secondary | ICD-10-CM | POA: Diagnosis not present

## 2023-10-20 DIAGNOSIS — Z811 Family history of alcohol abuse and dependence: Secondary | ICD-10-CM | POA: Insufficient documentation

## 2023-10-20 DIAGNOSIS — Z85828 Personal history of other malignant neoplasm of skin: Secondary | ICD-10-CM | POA: Insufficient documentation

## 2023-10-20 DIAGNOSIS — Z823 Family history of stroke: Secondary | ICD-10-CM | POA: Diagnosis not present

## 2023-10-20 DIAGNOSIS — G35 Multiple sclerosis: Secondary | ICD-10-CM | POA: Insufficient documentation

## 2023-10-20 DIAGNOSIS — Z993 Dependence on wheelchair: Secondary | ICD-10-CM | POA: Diagnosis not present

## 2023-10-20 DIAGNOSIS — C8283 Other types of follicular lymphoma, intra-abdominal lymph nodes: Secondary | ICD-10-CM

## 2023-10-20 LAB — CMP (CANCER CENTER ONLY)
ALT: 11 U/L (ref 0–44)
AST: 14 U/L — ABNORMAL LOW (ref 15–41)
Albumin: 3.7 g/dL (ref 3.5–5.0)
Alkaline Phosphatase: 95 U/L (ref 38–126)
Anion gap: 9 (ref 5–15)
BUN: 13 mg/dL (ref 8–23)
CO2: 28 mmol/L (ref 22–32)
Calcium: 9.3 mg/dL (ref 8.9–10.3)
Chloride: 102 mmol/L (ref 98–111)
Creatinine: 0.97 mg/dL (ref 0.44–1.00)
GFR, Estimated: >60 mL/min (ref >60)
Glucose, Bld: 107 mg/dL — ABNORMAL HIGH (ref 70–99)
Potassium: 4 mmol/L (ref 3.5–5.1)
Sodium: 139 mmol/L (ref 135–145)
Total Bilirubin: 0.3 mg/dL (ref 0.0–1.2)
Total Protein: 6.3 g/dL — ABNORMAL LOW (ref 6.5–8.1)

## 2023-10-20 LAB — CBC WITH DIFFERENTIAL (CANCER CENTER ONLY)
Abs Immature Granulocytes: 0.03 10*3/uL (ref 0.00–0.07)
Basophils Absolute: 0.1 10*3/uL (ref 0.0–0.1)
Basophils Relative: 1 %
Eosinophils Absolute: 0.5 10*3/uL (ref 0.0–0.5)
Eosinophils Relative: 7 %
HCT: 40.7 % (ref 36.0–46.0)
Hemoglobin: 13.1 g/dL (ref 12.0–15.0)
Immature Granulocytes: 0 %
Lymphocytes Relative: 13 %
Lymphs Abs: 0.9 10*3/uL (ref 0.7–4.0)
MCH: 26.2 pg (ref 26.0–34.0)
MCHC: 32.2 g/dL (ref 30.0–36.0)
MCV: 81.4 fL (ref 80.0–100.0)
Monocytes Absolute: 0.6 10*3/uL (ref 0.1–1.0)
Monocytes Relative: 8 %
Neutro Abs: 4.7 10*3/uL (ref 1.7–7.7)
Neutrophils Relative %: 71 %
Platelet Count: 208 10*3/uL (ref 150–400)
RBC: 5 MIL/uL (ref 3.87–5.11)
RDW: 14.3 % (ref 11.5–15.5)
WBC Count: 6.7 10*3/uL (ref 4.0–10.5)
nRBC: 0 % (ref 0.0–0.2)

## 2023-10-20 LAB — CK TOTAL AND CKMB (NOT AT ARMC)
CK, MB: 2.2 ng/mL (ref 0.5–5.0)
Total CK: 74 U/L (ref 38–234)

## 2023-10-20 LAB — LACTATE DEHYDROGENASE: LDH: 195 U/L — ABNORMAL HIGH (ref 98–192)

## 2023-10-20 LAB — MAGNESIUM: Magnesium: 1.9 mg/dL (ref 1.7–2.4)

## 2023-10-20 MED ORDER — VISMODEGIB 150 MG PO CAPS
150.0000 mg | ORAL_CAPSULE | Freq: Every day | ORAL | 3 refills | Status: DC
  Filled 2023-10-21: qty 28, 28d supply, fill #0
  Filled 2023-11-10: qty 28, 28d supply, fill #1
  Filled 2023-12-10: qty 28, 28d supply, fill #2
  Filled 2024-01-20 – 2024-01-22 (×2): qty 28, 28d supply, fill #3

## 2023-10-20 NOTE — Assessment & Plan Note (Signed)
 Treated with rituximab  x 5 doses in 2018, followed by radiation therapy to the inguinal area.  In remission.

## 2023-10-20 NOTE — Telephone Encounter (Signed)
 Oral Oncology Patient Advocate Encounter  After completing a benefits investigation, prior authorization for [employer] is not required at this time through [facility] [employer].  Patient's copay is $0.     Bernese Doffing (Patty) Chet Burnet, CPhT  [facility], [place], [place], [place] Oral Chemotherapy Patient Advocate Phone: [phone]  Fax: [phone]

## 2023-10-20 NOTE — Progress Notes (Signed)
 Patient shared she is a little upset being sent to a SNF to live. She denied wanting to harm herself or anyone else. We did review her SDOH to clarify some things.

## 2023-10-20 NOTE — Progress Notes (Signed)
 Patient given written information on Visodegib oral  and contact information for Oral Oncology team Encouraged her to call with any questions

## 2023-10-20 NOTE — Telephone Encounter (Signed)
 Spoke with Sari, RN and serum creatine phosphokinase was able to be added on using the blood draw from earlier today.

## 2023-10-20 NOTE — Assessment & Plan Note (Signed)
 Basal cell carcinoma excised from the scalp in March 2025, previously excised from tip of nose in 2024. Concern for future recurrence.   Discussed vismodegib for recurrence prevention. Side effects include electrolyte disturbances (hypokalemia, hypomagnesemia), alopecia in nearly two-thirds, weight loss, dysgeusia, anorexia, gastrointestinal disturbances (diarrhea or constipation, nausea, vomiting), fatigue, muscle spasms, and rare skin rash.  Radiation therapy considered for close margins or residual cancer, but not currently indicated. She agreed to try vismodegib.  CBCD and CMP are grossly unremarkable today.  LDH normal.  - Order vismodegib 150 mg daily through specialty pharmacy.  Will arrange chemo education for this.  - Schedule follow-up in 2-3 weeks to assess vismodegib tolerance

## 2023-10-20 NOTE — Telephone Encounter (Addendum)
 Pharmacy Student Encounter   Received new prescription for Vismodegib for the treatment of Basal Cell Carcinoma. Patient will take until disease progressions,unacceptable toxicity or treatment discontinuation.  Labs from 10/20/2022 creatine level assessed. Prescription dose and frequency assessed. Would recommend checking a baseline serum creatine phosphokinase.  Current medication list in Epic reviewed, vismodegib DDIs with Magnesium  Oxide identified: Magnesium  Oxide: concurrent use of Vismodegib and antiacids may result in reduced Vismodegib systemic exposure. It is recommended to separate  the magnesium  oxide and Vismodegib by several hours.  Evaluated chart and no patient barriers to medication adherence identified.   Prescription has been e-scribed to the [facility] for benefits analysis and approval.  Oral Oncology Clinic will continue to follow for insurance authorization, copayment issues, initial counseling and start date.   Josejuan Hoaglin, PharmD Candidate 2028 [facility] [phone]  10/20/2023 10:25 AM

## 2023-10-20 NOTE — Progress Notes (Signed)
 [facility]  ONCOLOGY CONSULT NOTE   PATIENT NAME: [name]   MR#: [id number] DOB: 01-27-1951  DATE OF SERVICE: 10/20/2023   REFERRING PROVIDER  Elspeth Coddington, MD  Patient Care Team: Lenon Layman ORN, MD as PCP - General (Internal Medicine) Lada, Newell SQUIBB, MD as Attending Physician (Family Medicine) Dellie Louanne MATSU, MD (General Surgery) Twylla Glendia BROCKS, MD (Urology) Coddington Elspeth, MD as Consulting Physician (Otolaryngology)    CHIEF COMPLAINT/ PURPOSE OF CONSULTATION:   Basal cell skin cancer of nose, scalp  ASSESSMENT & PLAN:   [name] is a 73 y.o. lady with a complex past medical history of multiple sclerosis, peripheral vascular disease status post left BKA, wheelchair dependence, COPD, hypertension, morbid obesity, was referred to our clinic for consideration of vismodegib therapy for her history of basal cell skin cancer of nose and scalp.  Basal cell carcinoma (BCC) of face Basal cell carcinoma excised from the scalp in March 2025, previously excised from tip of nose in 2024. Concern for future recurrence.   Discussed vismodegib for recurrence prevention. Side effects include electrolyte disturbances (hypokalemia, hypomagnesemia), alopecia in nearly two-thirds, weight loss, dysgeusia, anorexia, gastrointestinal disturbances (diarrhea or constipation, nausea, vomiting), fatigue, muscle spasms, and rare skin rash.  Radiation therapy considered for close margins or residual cancer, but not currently indicated. She agreed to try vismodegib.  CBCD and CMP are grossly unremarkable today.  LDH normal.  - Order vismodegib 150 mg daily through specialty pharmacy.  Will arrange chemo education for this.  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  In  remission.     I reviewed lab results and outside records for this visit and discussed relevant results with the patient. Diagnosis, plan of care and treatment options were also discussed in detail with the patient. Opportunity provided to ask questions and answers provided to her apparent satisfaction. Provided instructions to call our clinic with any problems, questions or concerns prior to return visit. I recommended to continue follow-up with PCP and sub-specialists. She verbalized understanding and agreed with the plan. No barriers to learning was detected.  NCCN guidelines have been consulted in the planning of this patient's care.  Chinita Patten, MD  10/20/2023 5:29 PM  [facility] - A DEPT OF [facility] [address] [place] [address] Dept: [phone] Dept Fax: [phone]   HISTORY OF PRESENTING ILLNESS:   I have reviewed her chart and materials related to her cancer extensively and collaborated history with the patient. Summary of oncologic history is as follows:  ONCOLOGY HISTORY:  She was diagnosed with stage II follicular lymphoma in March 2018.  Treated with full dose of rituximab  from April to July 2018 followed by radiation to inguinal region.  Treatments were given at [facility].  She remained in remission after this.  She was dealing with a blister on her nose at least since 2023, which she initially thought was a fever blister that would eventually disappear.  It started to itch and growing in size.  Hence a referral was sent to Dr. Lloyd, dermatology.  She was diagnosed with basal cell skin cancer.  Patient was referred to Dr. Coddington for coordination of repair of her large basal cell carcinoma of the nose.  It was at  least 2 cm in diameter on the tip of the nose, effacing almost the entire nasal tip extending into the dorsum, the right side wall, the right ala.  Anticipated surgical defect would probably involve at least from one third to one half of the nose. No cervical lymphadenopathy.   She eventually underwent Mohs surgery and sustained acquired nasal deformity.  On 07/25/2023, she had repair of nasal Mohs defect with interpolated pedicled paramedian forehead flap 3.5 x 2.5cm, right auricular cartilage graft harvest for right nasal alar reconstruction, excision wound of head 3 x 2.5cm in preparation for nasal flap, repair of forehead donor site with 3 x 2.5cm integra acellular matrix and shave biopsy malignant neoplasm of scalp.  Biopsies from the scalp also showed basal cell carcinoma, nodular type, margins negative for carcinoma.  On 09/24/2023, she underwent division and inset of pedicle forehead flap to nose.  Dr. Luciano discussed wide local excision with secondary reconstruction versus a referral to oncology for vismodegib therapy.  Patient opted to establish with medical oncology.  On her consultation with us  on 10/20/2023, discussed vismodegib 150 mg orally daily with dose titration depending on side effects.  Patient agreeable.  Oncology History   No history exists.    INTERVAL HISTORY:  Discussed the use of AI scribe software for clinical note transcription with the patient, who gave verbal consent to proceed.  History of Present Illness [name] is a 73 year old female who presents for follow-up after surgical removal of skin cancer lesions. She was referred by Dr. Graciela for evaluation of skin cancer.  She underwent surgery in March for the removal of cancerous lesions on her scalp and nose. The scalp lesion was completely excised, and the procedure on her nose was related to a previous surgery. There is a concern for recurrence, hence the referral to oncology for further management.  She has a history of lymphoma diagnosed in 2017, for which she received treatment at [facility]. She does not wish to return there unless necessary.  There is no current active treatment or recurrence noted.  She resides in a long-term care facility, having transitioned from rehab, as her husband feels unable to care for her at home. She has been at the facility for approximately three years.  She reports no current treatment for multiple sclerosis and is not on any steroids or other medications for this condition.  Her appetite is generally good, eating when the food is appealing, and she denies any current issues with eating.    MEDICAL HISTORY:  Past Medical History:  Diagnosis Date   Abdominal aortic atherosclerosis (HCC) 11/11/2016   ADHD    Anxiety    COPD (chronic obstructive pulmonary disease) (HCC)    Depression    major depressive   Dyspnea    doe   Edema    left leg   Follicular lymphoma (HCC)    B Cell   Follicular lymphoma grade II (HCC)    Hypertension    Hypotension    idiopathic   Kyphoscoliosis and scoliosis 11/26/2011   Morbid obesity (HCC) 01/05/2016   Multiple sclerosis (HCC)    Multiple sclerosis (HCC)    1980's   Neuromuscular disorder (HCC)    Obstructive and reflux uropathy    foley   Pain    atypical facial   Peripheral vascular disease (HCC)    Peripheral vascular disease of lower extremity with ulceration (HCC) 11/08/2015   Skin ulcer (HCC) 11/08/2015   Sleep apnea  per pt report   Weakness    generalized. has MS    SURGICAL HISTORY: Past Surgical History:  Procedure Laterality Date   AMPUTATION Left 02/01/2022   Procedure: AMPUTATION BELOW KNEE;  Surgeon: Marea Selinda RAMAN, MD;  Location: [facility] ORS;  Service: Vascular;  Laterality: Left;   BACK SURGERY N/A 2002   BONE BIOPSY Left 12/24/2021   Procedure: BONE BIOPSY-HEEL BONE;  Surgeon: Gershon Donnice SAUNDERS, DPM;  Location: [facility] OR[facility];  Service: Podiatry;  Laterality: Left;   CYST EXCISION     lower back   EXCISION MASS HEAD Right 07/25/2023   Procedure: EXCISION AND REPAIR OF RIGHT NOSE MOHS DEFECT;  Surgeon: Luciano Standing, MD;   [facility]: [facility];  Service: ENT;  Laterality: Right;   INGUINAL LYMPH NODE BIOPSY Left 07/04/2016   Procedure: INGUINAL LYMPH NODE BIOPSY;  Surgeon: Louanne KANDICE Muse, MD;  Location: [facility];  Service: General;  Laterality: Left;   NASAL FLAP ROTATION Right 07/25/2023   Procedure: POSSIBLE NASOLABIAL FLAP; POSSIBLE PARAMEDIAN FOREHEAD FLAP;  Surgeon: Luciano Standing, MD;  Location: [facility];  Service: ENT;  Laterality: Right;   NASAL FLAP ROTATION Right 09/24/2023   Procedure: DIVISION AND INSET OF PEDICLE FOREHEAD FLAP TO NOSE;  Surgeon: Luciano Standing, MD;  Location: [facility];  Service: ENT;  Laterality: Right;  STAGE 2 MOHS REPAIR WITH DIVISION AND INSET OF PEDICLE FOREHEAD FLAP TO NOSE   ORIF FEMUR FRACTURE Left 02/04/2020   Procedure: OPEN REDUCTION INTERNAL FIXATION (ORIF) DISTAL FEMUR FRACTURE;  Surgeon: Kendal Franky SQUIBB, MD;  Location: [facility];  Service: Orthopedics;  Laterality: Left;   PORTACATH PLACEMENT N/A 07/22/2016   Procedure: INSERTION PORT-A-CATH;  Surgeon: Louanne KANDICE Muse, MD;  Location: [facility];  Service: General;  Laterality: N/A;   SKIN FULL THICKNESS GRAFT Right 07/25/2023   Procedure: ADJACENT TISSUE TRANSFER; POSSIBLE SKIN GRAFT FROM RIGHT EAR OR FOREHEAD; POSSIBLE RIGHT EAR CARTILAGE GRAFT;  Surgeon: Luciano Standing, MD;  Location: [facility];  Service: ENT;  Laterality: Right;   TONSILLECTOMY AND ADENOIDECTOMY     TUBAL LIGATION     WOUND DEBRIDEMENT Left 12/24/2021   Procedure: DEBRIDEMENT WOUND-HEEL ULCER;  Surgeon: Gershon Donnice SAUNDERS, DPM;  Location: [facility];  Service: Podiatry;  Laterality: Left;    SOCIAL HISTORY: Social History   Socioeconomic History   Marital status: Married    Spouse name: Not on file   Number of children: 3   Years of education: Not on file   Highest education level: Not on file  Occupational History   Occupation: disabled  Tobacco Use   Smoking status: Former    Current packs/day: 0.00    Average packs/day: 1 pack/day for 20.8 years (20.8  ttl pk-yrs)    Types: Cigarettes    Start date: 04/30/1995    Quit date: 02/03/2016    Years since quitting: 7.7   Smokeless tobacco: Never  Vaping Use   Vaping status: Former  Substance and Sexual Activity   Alcohol use: No    Alcohol/week: 0.0 standard drinks of alcohol   Drug use: Not Currently   Sexual activity: Not Currently    Birth control/protection: None  Other Topics Concern   Not on file  Social History Narrative   She is married.    Social Drivers of Corporate investment banker Strain: Not on file  Food Insecurity: No Food Insecurity (10/20/2023)   Hunger Vital Sign    Worried About Running Out of Food in the Last Year: Never true  Ran Out of Food in the Last Year: Never true  Transportation Needs: No Transportation Needs (10/20/2023)   PRAPARE - Administrator, Civil Service (Medical): No    Lack of Transportation (Non-Medical): No  Physical Activity: Not on file  Stress: Not on file  Social Connections: Not on file  Intimate Partner Violence: Not At Risk (10/20/2023)   Humiliation, Afraid, Rape, and Kick questionnaire    Fear of Current or Ex-Partner: No    Emotionally Abused: No    Physically Abused: No    Sexually Abused: No    FAMILY HISTORY: Family History  Problem Relation Age of Onset   COPD Mother    Diabetes Mother    Heart failure Mother    Alcohol abuse Father    Kidney disease Father    Kidney failure Father    Arthritis Sister    CAD Maternal Grandmother    Stroke Maternal Grandfather    Arthritis Sister    Mental illness Sister    Arthritis Brother     ALLERGIES:  She has no known allergies.  MEDICATIONS:  Current Outpatient Medications  Medication Sig Dispense Refill   acetaminophen  (TYLENOL ) 500 MG tablet Take 1,000 mg by mouth every 8 (eight) hours as needed for moderate pain.     albuterol  (PROVENTIL ) (2.5 MG/3ML) 0.083% nebulizer solution Take 2.5 mg by nebulization every 4 (four) hours as needed for wheezing or  shortness of breath.     amLODipine  (NORVASC ) 5 MG tablet Take 5 mg by mouth daily.     budesonide-formoterol  (SYMBICORT) 160-4.5 MCG/ACT inhaler Inhale 2 puffs into the lungs 2 (two) times daily.      buPROPion  (WELLBUTRIN  XL) 300 MG 24 hr tablet Take 300 mg by mouth daily.     chlorhexidine  (HIBICLENS ) 4 % external liquid Apply 1 Application topically daily.     cholecalciferol (VITAMIN D3) 25 MCG (1000 UNIT) tablet Take 2,000 Units by mouth daily.     clonazePAM  (KLONOPIN ) 0.5 MG tablet Take 1 tablet (0.5 mg total) by mouth 2 (two) times daily as needed for anxiety. Home med. (Patient taking differently: Take 0.5 mg by mouth 2 (two) times daily. Home med.) 10 tablet 0   Cranberry 400 MG CAPS Take 400 mg by mouth 2 (two) times daily.     docusate sodium  (COLACE) 100 MG capsule Take 1 capsule (100 mg total) by mouth 2 (two) times daily as needed. (Patient taking differently: Take 100 mg by mouth 2 (two) times daily.)     DULoxetine  (CYMBALTA ) 60 MG capsule Take 1 capsule (60 mg total) by mouth every morning. 30 capsule 3   gabapentin  (NEURONTIN ) 600 MG tablet Take 600 mg by mouth 3 (three) times daily.     hydrOXYzine (ATARAX) 25 MG tablet Take 50 mg by mouth at bedtime.     magnesium  oxide (MAG-OX) 400 (240 Mg) MG tablet Take 400 mg by mouth daily.     Multiple Vitamin (MULTIVITAMIN WITH MINERALS) TABS tablet Take 1 tablet by mouth daily.     naloxone (NARCAN) nasal spray 4 mg/0.1 mL Place 1 spray into the nose 3 (three) times daily as needed (symptoms of opioid overdose).     oxyCODONE  (OXY IR/ROXICODONE ) 5 MG immediate release tablet Take 1 tablet (5 mg total) by mouth every 8 (eight) hours as needed for moderate pain or severe pain. Home med. 10 tablet 0   QUEtiapine  Fumarate 150 MG TABS Take 150 mg by mouth at bedtime.  traZODone  (DESYREL ) 50 MG tablet To be resumed by facility physician as deemed appropriate. (Patient taking differently: Take 50 mg by mouth at bedtime.)     triamcinolone   cream (KENALOG ) 0.1 % Apply 1 Application topically 2 (two) times daily.     vismodegib (ERIVEDGE) 150 MG capsule Take 1 capsule (150 mg total) by mouth daily. 30 capsule 3   vortioxetine  HBr (TRINTELLIX ) 5 MG TABS tablet Duplicate psych med.  To be resumed by facility physician as deemed appropriate. (Patient taking differently: Take 5 mg by mouth daily.) 30 tablet    No current facility-administered medications for this visit.   Facility-Administered Medications Ordered in Other Visits  Medication Dose Route Frequency Provider Last Rate Last Admin   heparin  lock flush 100 unit/mL  500 Units Intracatheter Once PRN Rao, Archana C, MD        REVIEW OF SYSTEMS:    Review of Systems - Oncology  All other pertinent systems were reviewed with the patient and are negative.  PHYSICAL EXAMINATION:   Onc Performance Status - 10/20/23 0906       ECOG Perf Status   ECOG Perf Status Completely disabled.  Cannot carry on any selfcare.  Totally confined to bed or chair      KPS SCALE   KPS % SCORE Disabled, requires special care and assistance          Vitals:   10/20/23 0859  BP: 113/70  Pulse: (!) 54  Resp: 18  Temp: 98.1 F (36.7 C)  SpO2: 99%   Filed Weights   10/20/23 0859  Weight: 276 lb 3.2 oz (125.3 kg)    Physical Exam Constitutional:      General: She is not in acute distress.    Appearance: Normal appearance.  HENT:     Head: Normocephalic.     Nose:     Comments: Reconstruction well-healed with good cosmetic result  Cardiovascular:     Rate and Rhythm: Normal rate.  Pulmonary:     Effort: Pulmonary effort is normal. No respiratory distress.  Abdominal:     General: There is no distension.   Musculoskeletal:     Comments: Left BKA   Skin:    Comments: Scars on the left side of scalp/ forehead are well healed   Neurological:     Mental Status: She is alert and oriented to person, place, and time.   Psychiatric:        Mood and Affect: Mood normal.         Behavior: Behavior normal.     LABORATORY DATA:   I have reviewed the data as listed.  Results for orders placed or performed in visit on 10/20/23  Magnesium   Result Value Ref Range   Magnesium  1.9 1.7 - 2.4 mg/dL  Lactate dehydrogenase  Result Value Ref Range   LDH 195 (H) 98 - 192 U/L  CMP (Cancer Center only)  Result Value Ref Range   Sodium 139 135 - 145 mmol/L   Potassium 4.0 3.5 - 5.1 mmol/L   Chloride 102 98 - 111 mmol/L   CO2 28 22 - 32 mmol/L   Glucose, Bld 107 (H) 70 - 99 mg/dL   BUN 13 8 - 23 mg/dL   Creatinine 9.02 9.55 - 1.00 mg/dL   Calcium  9.3 8.9 - 10.3 mg/dL   Total Protein 6.3 (L) 6.5 - 8.1 g/dL   Albumin  3.7 3.5 - 5.0 g/dL   AST 14 (L) 15 - 41 U/L   ALT  11 0 - 44 U/L   Alkaline Phosphatase 95 38 - 126 U/L   Total Bilirubin 0.3 0.0 - 1.2 mg/dL   GFR, Estimated >39 >39 mL/min   Anion gap 9 5 - 15  CBC with Differential (Cancer Center Only)  Result Value Ref Range   WBC Count 6.7 4.0 - 10.5 K/uL   RBC 5.00 3.87 - 5.11 MIL/uL   Hemoglobin 13.1 12.0 - 15.0 g/dL   HCT 59.2 63.9 - 53.9 %   MCV 81.4 80.0 - 100.0 fL   MCH 26.2 26.0 - 34.0 pg   MCHC 32.2 30.0 - 36.0 g/dL   RDW 85.6 88.4 - 84.4 %   Platelet Count 208 150 - 400 K/uL   nRBC 0.0 0.0 - 0.2 %   Neutrophils Relative % 71 %   Neutro Abs 4.7 1.7 - 7.7 K/uL   Lymphocytes Relative 13 %   Lymphs Abs 0.9 0.7 - 4.0 K/uL   Monocytes Relative 8 %   Monocytes Absolute 0.6 0.1 - 1.0 K/uL   Eosinophils Relative 7 %   Eosinophils Absolute 0.5 0.0 - 0.5 K/uL   Basophils Relative 1 %   Basophils Absolute 0.1 0.0 - 0.1 K/uL   Immature Granulocytes 0 %   Abs Immature Granulocytes 0.03 0.00 - 0.07 K/uL      RADIOGRAPHIC STUDIES:  I have personally reviewed the radiological images as listed and agree with the findings in the report.  No results found.  Orders Placed This Encounter  Procedures   CBC with Differential (Cancer Center Only)    Standing Status:   Future    Number of Occurrences:    1    Expiration Date:   10/19/2024   CMP (Cancer Center only)    Standing Status:   Future    Number of Occurrences:   1    Expiration Date:   10/19/2024   Lactate dehydrogenase    Standing Status:   Future    Number of Occurrences:   1    Expiration Date:   10/19/2024   Magnesium     Standing Status:   Future    Number of Occurrences:   1    Expiration Date:   10/19/2024    CODE STATUS:  Code Status History     Date Active Date Inactive Code Status Order ID Comments User Context   10/30/2022 1435 11/02/2022 1722 DNR [id number]  Eldonna Elspeth PARAS, MD ED   01/29/2022 2251 02/06/2022 2227 Full Code [id number]  Jurline Rockie CROME, NP Inpatient   01/25/2022 1429 01/29/2022 2251 DNR [id number]  Tina Tobey Jama LOISE, NP Inpatient   01/23/2022 1559 01/25/2022 1429 Full Code [id number]  Parris Manna, MD ED   01/10/2022 0915 01/12/2022 2251 Full Code [id number]  Lanetta Lingo, MD ED   12/22/2021 1155 12/27/2021 0016 Full Code [id number]  Cox, Amy N, DO ED   06/30/2021 1035 07/05/2021 2227 Full Code [id number]  Lanetta Lingo, MD ED   03/23/2021 2016 03/29/2021 2002 Full Code [id number]  Jens Durand, MD Inpatient   02/04/2020 0240 02/10/2020 1803 Full Code [id number]  Cox, Amy N, DO Inpatient   02/04/2020 0201 02/04/2020 0240 Full Code [id number]  Cox, Amy N, DO Inpatient   01/26/2020 2347 02/03/2020 2347 Full Code [id number]  Von Bellis, MD ED   01/01/2020 0628 01/06/2020 2118 Full Code [id number]  Chotiner, Adine RAMAN, MD Inpatient   11/19/2019 1150 11/24/2019 2054 DNR [id number]  Lanetta Lingo, MD ED   11/19/2019 1133 11/19/2019 1150 Full  Code [id number]  Lanetta Lingo, MD ED   10/17/2019 1927 10/22/2019 2129 Full Code [id number]  Sim Emery CROME, MD ED   06/20/2019 1654 06/23/2019 0206 Full Code [id number]  Tobie Mario GAILS, MD ED   04/20/2019 2116 04/24/2019 1710 Full Code [id number]  Mansy, Madison LABOR, MD ED   02/02/2019 1553 02/09/2019 2027 Full Code [id number]  Yisroel Sleight, MD ED   01/05/2019 0647 01/08/2019 2202 Full Code  [id number]  Stephania Ozell RAMAN ED   06/11/2018 2158 06/14/2018 1807 Full Code [id number]  Lonzo Pollen, MD Inpatient   07/07/2017 2000 07/08/2017 1631 Full Code [id number]  Tobie Press, MD Inpatient   05/14/2017 0152 05/15/2017 1953 Full Code [id number]  Jenel Lenis, MD Inpatient   04/01/2017 1723 04/05/2017 1742 Full Code [id number]  Gwendlyn Kevin LABOR, RN Inpatient   02/15/2017 1715 02/17/2017 2008 Full Code [id number]  Runell Redia PARAS, MD Inpatient   02/06/2017 2356 02/09/2017 1858 Full Code [id number]  Tobie Press, MD Inpatient   01/02/2017 1903 01/06/2017 1848 Full Code [id number]  Yisroel Sleight, MD ED   12/29/2016 1252 12/30/2016 1722 Full Code [id number]  Andreas Nissen, MD Inpatient   10/11/2016 2320 10/14/2016 1726 Full Code [id number]  Grayland Bur, MD Inpatient   08/28/2016 1159 08/30/2016 1932 Full Code [id number]  Josette Ade, MD ED   12/18/2015 1956 12/19/2015 1939 Full Code [id number]  Hower, Lenis POUR, MD ED    Questions for Most Recent Historical Code Status (Order [id number])     Question Answer   If patient has no pulse and is not breathing Do Not Attempt Resuscitation   If patient has a pulse and/or is breathing: Medical Treatment Goals LIMITED ADDITIONAL INTERVENTIONS: Use medication/IV fluids and cardiac monitoring as indicated; Do not use intubation or mechanical ventilation (DNI), also provide comfort medications.  Transfer to Progressive/Stepdown as indicated, avoid Intensive Care.   Consent: Discussion documented in EHR or advanced directives reviewed                Advance Directive Documentation    Flowsheet Row Most Recent Value  Type of Advance Directive Healthcare Power of Attorney, Living will  Pre-existing out of facility DNR order (yellow form or pink MOST form) --  MOST Form in Place? --    Future Appointments  Date Time Provider Department Center  11/10/2023 11:30 AM [facility] PHLEBOTOMIST [facility] None  11/10/2023 11:45 AM Joscelynn Brutus, Chinita, MD [facility] None      I spent a total of 60 minutes during this encounter with the patient including review of chart and various tests results, discussions about plan of care and coordination of care plan.  This document was completed utilizing speech recognition software. Grammatical errors, random word insertions, pronoun errors, and incomplete sentences are an occasional consequence of this system due to software limitations, ambient noise, and hardware issues. Any formal questions or concerns about the content, text or information contained within the body of this dictation should be directly addressed to the provider for clarification.

## 2023-10-21 ENCOUNTER — Telehealth: Payer: Self-pay

## 2023-10-21 ENCOUNTER — Other Ambulatory Visit: Payer: Self-pay

## 2023-10-21 ENCOUNTER — Telehealth: Payer: Self-pay | Admitting: Pharmacy Technician

## 2023-10-21 ENCOUNTER — Other Ambulatory Visit: Payer: Self-pay | Admitting: Pharmacy Technician

## 2023-10-21 NOTE — Telephone Encounter (Signed)
 Oncology Pharmacist Encounter  Patient is currently living at [facility] nursing home and patient does not have an active phone number in her chart. The phone number for the nursing facility is the number in patients chart. When delivery was being set up with technician and Leotis the unit manager, it was stated that she declined education from the pharmacist. Our technician verified with the unit manager to see if she would like some information about handling/ storage or side effects and unit manager declined again stating that she will call if she has any issues. Education was not able to be completed.   Technician provided phone number to unit manager to call if any issues arise. Patient will start medication once it arrives.   Araf Clugston, PharmD Hematology/Oncology Clinical Pharmacist [facility] [phone]

## 2023-10-21 NOTE — Progress Notes (Signed)
 Specialty Pharmacy Initial Fill Coordination Note  [name] is a 72 y.o. female contacted today regarding refills of specialty medication(s) Vismodegib (ERIVEDGE) .  Patient requested Delivery  on 10/23/23  to verified address [address], [place], [place] [address]   Medication will be filled on 06/25.   Patient is aware of $0 copayment.   Unit manager, Leotis, declined medication education from pharmacist.   Jasper [phone]) Chet Burnet, CPhT  [facility], [place], [place], [place] Oral Chemotherapy Patient Advocate Phone: [phone]  Fax: [phone]

## 2023-10-21 NOTE — Progress Notes (Signed)
 Oncology Pharmacist Encounter   Patient education handout mailed to nursing home as unit manager Brandy declined patient education by the pharmacist.   Alim Cattell, PharmD Hematology/Oncology Clinical Pharmacist [facility] [phone]

## 2023-10-21 NOTE — Telephone Encounter (Addendum)
 Patient successfully OnBoarded. Unit manager, Leotis, declined medication education from pharmacist. Medication scheduled to be shipped on 06/25 for delivery on 06/26 from [facility] to [facility]. Patient also knows to call me at [phone] with any questions or concerns regarding receiving medication or if there is any unexpected change in co-pay.    Claritza July (Patty) Chet Burnet, CPhT  [facility], [place], [place], [place] Oral Chem[facility] Patient Advocate Phone: [phone]  Fax: [phone]

## 2023-11-03 ENCOUNTER — Other Ambulatory Visit: Payer: Self-pay | Admitting: Oncology

## 2023-11-03 DIAGNOSIS — C44310 Basal cell carcinoma of skin of unspecified parts of face: Secondary | ICD-10-CM

## 2023-11-10 ENCOUNTER — Encounter: Payer: Self-pay | Admitting: Oncology

## 2023-11-10 ENCOUNTER — Other Ambulatory Visit: Payer: Self-pay

## 2023-11-10 ENCOUNTER — Inpatient Hospital Stay (HOSPITAL_BASED_OUTPATIENT_CLINIC_OR_DEPARTMENT_OTHER): Admitting: Oncology

## 2023-11-10 ENCOUNTER — Inpatient Hospital Stay: Attending: Oncology

## 2023-11-10 VITALS — BP 130/62 | HR 59 | Temp 98.0°F | Resp 18

## 2023-11-10 DIAGNOSIS — C8283 Other types of follicular lymphoma, intra-abdominal lymph nodes: Secondary | ICD-10-CM | POA: Diagnosis not present

## 2023-11-10 DIAGNOSIS — C8293 Follicular lymphoma, unspecified, intra-abdominal lymph nodes: Secondary | ICD-10-CM | POA: Insufficient documentation

## 2023-11-10 DIAGNOSIS — I10 Essential (primary) hypertension: Secondary | ICD-10-CM | POA: Insufficient documentation

## 2023-11-10 DIAGNOSIS — M950 Acquired deformity of nose: Secondary | ICD-10-CM | POA: Insufficient documentation

## 2023-11-10 DIAGNOSIS — C44310 Basal cell carcinoma of skin of unspecified parts of face: Secondary | ICD-10-CM

## 2023-11-10 DIAGNOSIS — J449 Chronic obstructive pulmonary disease, unspecified: Secondary | ICD-10-CM | POA: Diagnosis not present

## 2023-11-10 DIAGNOSIS — Z993 Dependence on wheelchair: Secondary | ICD-10-CM | POA: Insufficient documentation

## 2023-11-10 DIAGNOSIS — C44311 Basal cell carcinoma of skin of nose: Secondary | ICD-10-CM | POA: Insufficient documentation

## 2023-11-10 DIAGNOSIS — C4441 Basal cell carcinoma of skin of scalp and neck: Secondary | ICD-10-CM | POA: Diagnosis present

## 2023-11-10 DIAGNOSIS — Z89512 Acquired absence of left leg below knee: Secondary | ICD-10-CM | POA: Diagnosis not present

## 2023-11-10 DIAGNOSIS — Z85828 Personal history of other malignant neoplasm of skin: Secondary | ICD-10-CM | POA: Insufficient documentation

## 2023-11-10 DIAGNOSIS — G35 Multiple sclerosis: Secondary | ICD-10-CM | POA: Diagnosis not present

## 2023-11-10 DIAGNOSIS — Z79899 Other long term (current) drug therapy: Secondary | ICD-10-CM | POA: Insufficient documentation

## 2023-11-10 LAB — CMP (CANCER CENTER ONLY)
ALT: 14 U/L (ref 0–44)
AST: 17 U/L (ref 15–41)
Albumin: 4 g/dL (ref 3.5–5.0)
Alkaline Phosphatase: 89 U/L (ref 38–126)
Anion gap: 11 (ref 5–15)
BUN: 9 mg/dL (ref 8–23)
CO2: 26 mmol/L (ref 22–32)
Calcium: 9.7 mg/dL (ref 8.9–10.3)
Chloride: 100 mmol/L (ref 98–111)
Creatinine: 0.93 mg/dL (ref 0.44–1.00)
GFR, Estimated: >60 mL/min (ref >60)
Glucose, Bld: 135 mg/dL — ABNORMAL HIGH (ref 70–99)
Potassium: 3.9 mmol/L (ref 3.5–5.1)
Sodium: 137 mmol/L (ref 135–145)
Total Bilirubin: 0.4 mg/dL (ref 0.0–1.2)
Total Protein: 6.7 g/dL (ref 6.5–8.1)

## 2023-11-10 LAB — MAGNESIUM: Magnesium: 1.8 mg/dL (ref 1.7–2.4)

## 2023-11-10 LAB — CBC WITH DIFFERENTIAL (CANCER CENTER ONLY)
Abs Immature Granulocytes: 0.03 K/uL (ref 0.00–0.07)
Basophils Absolute: 0.1 K/uL (ref 0.0–0.1)
Basophils Relative: 1 %
Eosinophils Absolute: 0.5 K/uL (ref 0.0–0.5)
Eosinophils Relative: 7 %
HCT: 40.5 % (ref 36.0–46.0)
Hemoglobin: 13.1 g/dL (ref 12.0–15.0)
Immature Granulocytes: 0 %
Lymphocytes Relative: 9 %
Lymphs Abs: 0.6 K/uL — ABNORMAL LOW (ref 0.7–4.0)
MCH: 26.3 pg (ref 26.0–34.0)
MCHC: 32.3 g/dL (ref 30.0–36.0)
MCV: 81.2 fL (ref 80.0–100.0)
Monocytes Absolute: 0.6 K/uL (ref 0.1–1.0)
Monocytes Relative: 8 %
Neutro Abs: 5.4 K/uL (ref 1.7–7.7)
Neutrophils Relative %: 75 %
Platelet Count: 205 K/uL (ref 150–400)
RBC: 4.99 MIL/uL (ref 3.87–5.11)
RDW: 13.9 % (ref 11.5–15.5)
WBC Count: 7.2 K/uL (ref 4.0–10.5)
nRBC: 0 % (ref 0.0–0.2)

## 2023-11-10 NOTE — Addendum Note (Signed)
 Addended by: TILLIE KEENE LABOR on: 11/10/2023 04:44 PM   Modules accepted: Orders

## 2023-11-10 NOTE — Assessment & Plan Note (Signed)
 Treated with rituximab  x 5 doses in 2018, followed by radiation therapy to the inguinal area.  In remission.

## 2023-11-10 NOTE — Progress Notes (Signed)
 [facility]  ONCOLOGY CLINIC PROGRESS NOTE   Patient Care Team: Lenon Layman ORN, MD as PCP - General (Internal Medicine) Lada, Newell SQUIBB, MD as Attending Physician (Family Medicine) Dellie Louanne MATSU, MD (General Surgery) Twylla Glendia BROCKS, MD (Urology) Luciano Standing, MD as Consulting Physician (Otolaryngology)  PATIENT NAME: [name]   MR#: [id number] DOB: 1951-04-15  Date of visit: 11/10/2023   ASSESSMENT & PLAN:   [name] is a 73 y.o. lady with a complex past medical history of multiple sclerosis, peripheral vascular disease status post left BKA, wheelchair dependence, COPD, hypertension, morbid obesity, was referred to our clinic for consideration of vismodegib  therapy for her history of basal cell skin cancer of nose and scalp.   Basal cell carcinoma (BCC) of face Basal cell carcinoma excised from the scalp in March 2025, previously excised from tip of nose in 2024. Concern for future recurrence.   Previously I discussed vismodegib  for recurrence prevention. Side effects include electrolyte disturbances (hypokalemia, hypomagnesemia), alopecia in nearly two-thirds, weight loss, dysgeusia, anorexia, gastrointestinal disturbances (diarrhea or constipation, nausea, vomiting), fatigue, muscle spasms, and rare skin rash.  Radiation therapy considered for close margins or residual cancer, but not currently indicated. She agreed to try vismodegib .  CBCD and CMP are grossly unremarkable today.  LDH normal recently.  In June 2025, she was started on vismodegib  150 mg daily and medication was delivered to her nursing home through specialty pharmacy.  She seems to be tolerating this fairly well.  Plan is to continue current dosing.  Labs reveal no dose-limiting toxicities.  - Schedule follow-up in 6 weeks to assess vismodegib  tolerance  Follicular lymphoma of intra-abdominal lymph nodes (HCC) Treated with rituximab  x 5 doses in 2018, followed by  radiation therapy to the inguinal area.  In remission.     I reviewed lab results and outside records for this visit and discussed relevant results with the patient. Diagnosis, plan of care and treatment options were also discussed in detail with the patient. Opportunity provided to ask questions and answers provided to her apparent satisfaction. Provided instructions to call our clinic with any problems, questions or concerns prior to return visit. I recommended to continue follow-up with PCP and sub-specialists. She verbalized understanding and agreed with the plan.   NCCN guidelines have been consulted in the planning of this patient's care.  I spent a total of 30 minutes during this encounter with the patient including review of chart and various tests results, discussions about plan of care and coordination of care plan.   Chinita Patten, MD  11/10/2023 2:29 PM  [facility] [id number] [place] [address] Dept: [phone] Dept Fax: [phone]    CHIEF COMPLAINT/ REASON FOR VISIT:   Basal cell carcinoma of the skin, recurrent on the face and scalp  Current Treatment: Vismodegib  150 mg p.o. daily started from June 2025.  INTERVAL HISTORY:    Discussed the use of AI scribe software for clinical note transcription with the patient, who gave verbal consent to proceed.  History of Present Illness [name] is a 73 year old female who presents for chemotherapy management.  She lives at a nursing home and they dispense her medications.  She was recently started on vismodegib  150 mg p.o. daily.  She has sores on her head and is unsure if radiation was used previously to treat them. She wants to '  get rid of this mess' and 'not have cancer anymore.' She uses cream on her sores but is unsure which one to use, although she notes that the sores are healing well. No use of any cream  for her sores, although she has two cans available.  She feels limited in her ability to perform activities she used to enjoy, such as dancing, and wants to be more independent. She believes that with crutches or knee pads, she could do more for herself, but feels that she is not being trained to use them.  She mentions a lack of family support, noting that her husband is in [place] and prefers to stay with his cats. She wishes to return home but feels her husband is reluctant to allow it, possibly due to concerns about her care.   I have reviewed the past medical history, past surgical history, social history and family history with the patient and they are unchanged from previous note.  HISTORY OF PRESENT ILLNESS:   ONCOLOGY HISTORY:   She was diagnosed with stage II follicular lymphoma in March 2018.  Treated with full dose of rituximab  from April to July 2018 followed by radiation to inguinal region.  Treatments were [facility] at [facility].  She remained in remission after this.   She was dealing with a blister on her nose at least since 2023, which she initially thought was a fever blister that would eventually disappear.  It started to itch and growing in size.  Hence a referral was sent to Dr. Lloyd, dermatology.  She was diagnosed with basal cell skin cancer.  Patient was referred to Dr. Luciano for coordination of repair of her large basal cell carcinoma of the nose.  It was at least 2 cm in diameter on the tip of the nose, effacing almost the entire nasal tip extending into the dorsum, the right side wall, the right ala. Anticipated surgical defect would probably involve at least from one third to one half of the nose. No cervical lymphadenopathy.    She eventually underwent Mohs surgery and sustained acquired nasal deformity.   On 07/25/2023, she had repair of nasal Mohs defect with interpolated pedicled paramedian forehead flap 3.5 x 2.5cm, right auricular  cartilage graft harvest for right nasal alar reconstruction, excision wound of head 3 x 2.5cm in preparation for nasal flap, repair of forehead donor site with 3 x 2.5cm integra acellular matrix and shave biopsy malignant neoplasm of scalp.  Biopsies from the scalp also showed basal cell carcinoma, nodular type, margins negative for carcinoma.   On 09/24/2023, she underwent division and inset of pedicle forehead flap to nose.   Dr. Luciano discussed wide local excision with secondary reconstruction versus a referral to oncology for vismodegib  therapy.  Patient opted to establish with medical oncology.   On her consultation with us  on 10/20/2023, discussed vismodegib  150 mg orally daily with dose titration depending on side effects.  Patient agreeable.   REVIEW OF SYSTEMS:   Review of Systems - Oncology  All other pertinent systems were reviewed with the patient and are negative.  ALLERGIES: She has no known allergies.  MEDICATIONS:  Current Outpatient Medications  Medication Sig Dispense Refill   acetaminophen  (TYLENOL ) 500 MG tablet Take 1,000 mg by mouth every 8 (eight) hours as needed for moderate pain.     albuterol  (PROVENTIL ) (2.5 MG/3ML) 0.083% nebulizer solution Take 2.5 mg by nebulization every 4 (four) hours as needed for wheezing or shortness of breath.  amLODipine  (NORVASC ) 5 MG tablet Take 5 mg by mouth daily.     budesonide-formoterol  (SYMBICORT) 160-4.5 MCG/ACT inhaler Inhale 2 puffs into the lungs 2 (two) times daily.      buPROPion  (WELLBUTRIN  XL) 300 MG 24 hr tablet Take 300 mg by mouth daily.     chlorhexidine  (HIBICLENS ) 4 % external liquid Apply 1 Application topically daily.     cholecalciferol (VITAMIN D3) 25 MCG (1000 UNIT) tablet Take 2,000 Units by mouth daily.     clonazePAM  (KLONOPIN ) 0.5 MG tablet Take 1 tablet (0.5 mg total) by mouth 2 (two) times daily as needed for anxiety. Home med. (Patient taking differently: Take 0.5 mg by mouth 2 (two) times daily. Home  med.) 10 tablet 0   Cranberry 400 MG CAPS Take 400 mg by mouth 2 (two) times daily.     docusate sodium  (COLACE) 100 MG capsule Take 1 capsule (100 mg total) by mouth 2 (two) times daily as needed. (Patient taking differently: Take 100 mg by mouth 2 (two) times daily.)     DULoxetine  (CYMBALTA ) 60 MG capsule Take 1 capsule (60 mg total) by mouth every morning. 30 capsule 3   gabapentin  (NEURONTIN ) 600 MG tablet Take 600 mg by mouth 3 (three) times daily.     hydrOXYzine (ATARAX) 25 MG tablet Take 50 mg by mouth at bedtime.     magnesium  oxide (MAG-OX) 400 (240 Mg) MG tablet Take 400 mg by mouth daily.     Multiple Vitamin (MULTIVITAMIN WITH MINERALS) TABS tablet Take 1 tablet by mouth daily.     naloxone (NARCAN) nasal spray 4 mg/0.1 mL Place 1 spray into the nose 3 (three) times daily as needed (symptoms of opioid overdose).     oxyCODONE  (OXY IR/ROXICODONE ) 5 MG immediate release tablet Take 1 tablet (5 mg total) by mouth every 8 (eight) hours as needed for moderate pain or severe pain. Home med. 10 tablet 0   QUEtiapine  Fumarate 150 MG TABS Take 150 mg by mouth at bedtime.     traZODone  (DESYREL ) 50 MG tablet To be resumed by facility physician as deemed appropriate. (Patient taking differently: Take 50 mg by mouth at bedtime.)     triamcinolone  cream (KENALOG ) 0.1 % Apply 1 Application topically 2 (two) times daily.     vismodegib  (ERIVEDGE ) 150 MG capsule Take 1 capsule (150 mg total) by mouth daily. 30 capsule 3   vortioxetine  HBr (TRINTELLIX ) 5 MG TABS tablet Duplicate psych med.  To be resumed by facility physician as deemed appropriate. (Patient taking differently: Take 5 mg by mouth daily.) 30 tablet    No current facility-administered medications for this visit.   Facility-Administered Medications Ordered in Other Visits  Medication Dose Route Frequency Provider Last Rate Last Admin   heparin  lock flush 100 unit/mL  500 Units Intracatheter Once PRN Melanee Annah BROCKS, MD          VITALS:   Blood pressure 130/62, pulse (!) 59, temperature 98 F (36.7 C), temperature source Temporal, resp. rate 18, SpO2 96%.  Wt Readings from Last 3 Encounters:  10/20/23 276 lb 3.2 oz (125.3 kg)  09/24/23 264 lb (119.7 kg)  07/25/23 275 lb (124.7 kg)    There is no height or weight on file to calculate BMI.    Onc Performance Status - 11/10/23 1153       ECOG Perf Status   ECOG Perf Status Completely disabled.  Cannot carry on any selfcare.  Totally confined to bed or chair  KPS SCALE   KPS % SCORE Disabled, requires special care and assistance          PHYSICAL EXAM:   Physical Exam Constitutional:      General: She is not in acute distress.    Appearance: Normal appearance.  HENT:     Head: Normocephalic and atraumatic.     Nose:     Comments:  Reconstruction well-healed with good cosmetic result Cardiovascular:     Rate and Rhythm: Normal rate.  Pulmonary:     Effort: Pulmonary effort is normal. No respiratory distress.  Abdominal:     General: There is no distension.  Musculoskeletal:     Comments: Left BKA  Skin:    Comments: Scars on the left side of scalp/ forehead are well healed   Neurological:     Mental Status: She is alert.       LABORATORY DATA:   I have reviewed the data as listed.  Results for orders placed or performed in visit on 11/10/23  Magnesium   Result Value Ref Range   Magnesium  1.8 1.7 - 2.4 mg/dL  CMP (Cancer Center only)  Result Value Ref Range   Sodium 137 135 - 145 mmol/L   Potassium 3.9 3.5 - 5.1 mmol/L   Chloride 100 98 - 111 mmol/L   CO2 26 22 - 32 mmol/L   Glucose, Bld 135 (H) 70 - 99 mg/dL   BUN 9 8 - 23 mg/dL   Creatinine 9.06 9.55 - 1.00 mg/dL   Calcium  9.7 8.9 - 10.3 mg/dL   Total Protein 6.7 6.5 - 8.1 g/dL   Albumin  4.0 3.5 - 5.0 g/dL   AST 17 15 - 41 U/L   ALT 14 0 - 44 U/L   Alkaline Phosphatase 89 38 - 126 U/L   Total Bilirubin 0.4 0.0 - 1.2 mg/dL   GFR, Estimated >39 >39 mL/min    Anion gap 11 5 - 15  CBC with Differential (Cancer Center Only)  Result Value Ref Range   WBC Count 7.2 4.0 - 10.5 K/uL   RBC 4.99 3.87 - 5.11 MIL/uL   Hemoglobin 13.1 12.0 - 15.0 g/dL   HCT 59.4 63.9 - 53.9 %   MCV 81.2 80.0 - 100.0 fL   MCH 26.3 26.0 - 34.0 pg   MCHC 32.3 30.0 - 36.0 g/dL   RDW 86.0 88.4 - 84.4 %   Platelet Count 205 150 - 400 K/uL   nRBC 0.0 0.0 - 0.2 %   Neutrophils Relative % 75 %   Neutro Abs 5.4 1.7 - 7.7 K/uL   Lymphocytes Relative 9 %   Lymphs Abs 0.6 (L) 0.7 - 4.0 K/uL   Monocytes Relative 8 %   Monocytes Absolute 0.6 0.1 - 1.0 K/uL   Eosinophils Relative 7 %   Eosinophils Absolute 0.5 0.0 - 0.5 K/uL   Basophils Relative 1 %   Basophils Absolute 0.1 0.0 - 0.1 K/uL   Immature Granulocytes 0 %   Abs Immature Granulocytes 0.03 0.00 - 0.07 K/uL      RADIOGRAPHIC STUDIES:  No recent pertinent imaging available to review  CODE STATUS:  Code Status History     Date Active Date Inactive Code Status Order ID Comments User Context   10/30/2022 1435 11/02/2022 1722 DNR [id number]  Eldonna Elspeth PARAS, MD ED   01/29/2022 2251 02/06/2022 2227 Full Code [id number]  Jurline Rockie CROME, NP Inpatient   01/25/2022 1429 01/29/2022 2251 DNR [id number]  Tina Tobey Jama LOISE, NP  Inpatient   01/23/2022 1559 01/25/2022 1429 Full Code [id number]  Parris Manna, MD ED   01/10/2022 0915 01/12/2022 2251 Full Code [id number]  Lanetta Lingo, MD ED   12/22/2021 1155 12/27/2021 0016 Full Code [id number]  Cox, Amy N, DO ED   06/30/2021 1035 07/05/2021 2227 Full Code [id number]  Lanetta Lingo, MD ED   03/23/2021 2016 03/29/2021 2002 Full Code [id number]  Jens Durand, MD Inpatient   02/04/2020 0240 02/10/2020 1803 Full Code [id number]  Cox, Amy N, DO Inpatient   02/04/2020 0201 02/04/2020 0240 Full Code [id number]  Cox, Amy N, DO Inpatient   01/26/2020 2347 02/03/2020 2347 Full Code [id number]  Von Bellis, MD ED   01/01/2020 0628 01/06/2020 2118 Full Code [id number]  Chotiner, Adine RAMAN, MD Inpatient    11/19/2019 1150 11/24/2019 2054 DNR [id number]  Lanetta Lingo, MD ED   11/19/2019 1133 11/19/2019 1150 Full Code [id number]  Lanetta Lingo, MD ED   10/17/2019 1927 10/22/2019 2129 Full Code [id number]  Sim Emery CROME, MD ED   06/20/2019 1654 06/23/2019 0206 Full Code [id number]  Tobie Mario GAILS, MD ED   04/20/2019 2116 04/24/2019 1710 Full Code [id number]  Mansy, Madison LABOR, MD ED   02/02/2019 1553 02/09/2019 2027 Full Code [id number]  Yisroel Sleight, MD ED   01/05/2019 0647 01/08/2019 2202 Full Code [id number]  Stephania Ozell RAMAN ED   06/11/2018 2158 06/14/2018 1807 Full Code [id number]  Lonzo Pollen, MD Inpatient   07/07/2017 2000 07/08/2017 1631 Full Code [id number]  Tobie Press, MD Inpatient   05/14/2017 0152 05/15/2017 1953 Full Code [id number]  Jenel Lenis, MD Inpatient   04/01/2017 1723 04/05/2017 1742 Full Code [id number]  Gwendlyn Kevin LABOR, RN Inpatient   02/15/2017 1715 02/17/2017 2008 Full Code [id number]  Runell Redia PARAS, MD Inpatient   02/06/2017 2356 02/09/2017 1858 Full Code [id number]  Tobie Press, MD Inpatient   01/02/2017 1903 01/06/2017 1848 Full Code [id number]  Yisroel Sleight, MD ED   12/29/2016 1252 12/30/2016 1722 Full Code [id number]  Andreas Nissen, MD Inpatient   10/11/2016 2320 10/14/2016 1726 Full Code [id number]  Grayland Bur, MD Inpatient   08/28/2016 1159 08/30/2016 1932 Full Code [id number]  Josette Ade, MD ED   12/18/2015 1956 12/19/2015 1939 Full Code [id number]  Hower, Lenis POUR, MD ED    Questions for Most Recent Historical Code Status (Order [id number])     Question Answer   If patient has no pulse and is not breathing Do Not Attempt Resuscitation   If patient has a pulse and/or is breathing: Medical Treatment Goals LIMITED ADDITIONAL INTERVENTIONS: Use medication/IV fluids and cardiac monitoring as indicated; Do not use intubation or mechanical ventilation (DNI), also provide comfort medications.  Transfer to Progressive/Stepdown as indicated, avoid Intensive Care.   Consent: Discussion  documented in EHR or advanced directives reviewed            Orders Placed This Encounter  Procedures   CBC with Differential (Cancer Center Only)    Standing Status:   Standing    Number of Occurrences:   6    Expiration Date:   11/09/2024   CMP (Cancer Center only)    Standing Status:   Standing    Number of Occurrences:   6    Expiration Date:   11/09/2024   Magnesium     Standing Status:   Standing    Number of Occurrences:   6    Expiration Date:   11/09/2024     Future Appointments  Date Time Provider Department Center  12/22/2023 11:00 AM [facility] PHLEBOTOMIST [facility] None  12/22/2023 11:30 AM Shirah Roseman, Chinita, MD [facility] None      This document was completed utilizing speech recognition software. Grammatical errors, random word insertions, pronoun errors, and incomplete sentences are an occasional consequence of this system due to software limitations, ambient noise, and hardware issues. Any formal questions or concerns about the content, text or information contained within the body of this dictation should be directly addressed to the provider for clarification.

## 2023-11-10 NOTE — Progress Notes (Signed)
 Specialty Pharmacy Refill Coordination Note  [name] is a 73 y.o. female contacted today regarding refills of specialty medication(s) Vismodegib  (ERIVEDGE )   Patient requested Delivery   Delivery date: 11/18/23   Verified address: [address] [place], [place] [address]   Medication will be filled on 11/17/23.

## 2023-11-10 NOTE — Progress Notes (Signed)
 Specialty Pharmacy Ongoing Clinical Assessment Note  Spoke to patient's nurse, COURTNI BALASH is a 73 y.o. female who is being followed by the specialty pharmacy service for RxSp Oncology   Patient's specialty medication(s) reviewed today: Vismodegib  (ERIVEDGE )   Missed doses in the last 4 weeks: 0   Patient/Caregiver did not have any additional questions or concerns.   Therapeutic benefit summary: Unable to assess   Adverse events/side effects summary: No adverse events/side effects   Patient's therapy is appropriate to: Continue    Goals Addressed             This Visit's Progress    Maintain optimal adherence to therapy   On track    Patient is on track. Patient will maintain adherence         Follow up: 3 months  [facility] Specialty Pharmacist

## 2023-11-10 NOTE — Assessment & Plan Note (Addendum)
 Basal cell carcinoma excised from the scalp in March 2025, previously excised from tip of nose in 2024. Concern for future recurrence.   Previously I discussed vismodegib  for recurrence prevention. Side effects include electrolyte disturbances (hypokalemia, hypomagnesemia), alopecia in nearly two-thirds, weight loss, dysgeusia, anorexia, gastrointestinal disturbances (diarrhea or constipation, nausea, vomiting), fatigue, muscle spasms, and rare skin rash.  Radiation therapy considered for close margins or residual cancer, but not currently indicated. She agreed to try vismodegib .  CBCD and CMP are grossly unremarkable today.  LDH normal recently.  In June 2025, she was started on vismodegib  150 mg daily and medication was delivered to her nursing home through specialty pharmacy.  She seems to be tolerating this fairly well.  Plan is to continue current dosing.  Labs reveal no dose-limiting toxicities.  - Schedule follow-up in 6 weeks to assess vismodegib  tolerance

## 2023-11-17 ENCOUNTER — Other Ambulatory Visit: Payer: Self-pay

## 2023-11-28 ENCOUNTER — Other Ambulatory Visit (HOSPITAL_COMMUNITY): Payer: Self-pay

## 2023-12-10 ENCOUNTER — Other Ambulatory Visit: Payer: Self-pay

## 2023-12-11 ENCOUNTER — Other Ambulatory Visit: Payer: Self-pay

## 2023-12-12 ENCOUNTER — Other Ambulatory Visit: Payer: Self-pay

## 2023-12-12 ENCOUNTER — Other Ambulatory Visit: Payer: Self-pay | Admitting: Pharmacy Technician

## 2023-12-12 NOTE — Progress Notes (Signed)
 Specialty Pharmacy Refill Coordination Note  [name] is a 73 y.o. female contacted today regarding refills of specialty medication(s) Vismodegib  (ERIVEDGE )   Patient requested Delivery   Delivery date: 12/23/23   Verified address: [facility] ATTN: Carrol Hougland Rm# [id number], [place], [place] [address]   Medication will be filled on 12/22/23.   Spoke to patient's nurse.

## 2023-12-22 ENCOUNTER — Inpatient Hospital Stay: Admitting: Oncology

## 2023-12-22 ENCOUNTER — Other Ambulatory Visit: Payer: Self-pay

## 2023-12-22 ENCOUNTER — Inpatient Hospital Stay

## 2023-12-22 ENCOUNTER — Telehealth: Payer: Self-pay | Admitting: Oncology

## 2023-12-22 NOTE — Telephone Encounter (Signed)
 PT will not be in today.

## 2023-12-30 ENCOUNTER — Inpatient Hospital Stay: Admitting: Oncology

## 2023-12-30 ENCOUNTER — Inpatient Hospital Stay

## 2023-12-30 ENCOUNTER — Telehealth: Payer: Self-pay | Admitting: Oncology

## 2023-12-30 NOTE — Telephone Encounter (Signed)
 Returning PT call to get appt rescheduled, voicemail not set up. Will try again later.

## 2023-12-30 NOTE — Telephone Encounter (Signed)
 Spoke with [name] at [facility] to get PT appt rescheduled. Date and Time confirmed.

## 2024-01-06 ENCOUNTER — Inpatient Hospital Stay (HOSPITAL_BASED_OUTPATIENT_CLINIC_OR_DEPARTMENT_OTHER): Admitting: Oncology

## 2024-01-06 ENCOUNTER — Encounter: Payer: Self-pay | Admitting: Oncology

## 2024-01-06 ENCOUNTER — Inpatient Hospital Stay: Attending: Oncology

## 2024-01-06 VITALS — BP 124/69 | HR 50 | Temp 97.6°F | Resp 18

## 2024-01-06 DIAGNOSIS — I739 Peripheral vascular disease, unspecified: Secondary | ICD-10-CM | POA: Insufficient documentation

## 2024-01-06 DIAGNOSIS — Z85828 Personal history of other malignant neoplasm of skin: Secondary | ICD-10-CM | POA: Insufficient documentation

## 2024-01-06 DIAGNOSIS — C8293 Follicular lymphoma, unspecified, intra-abdominal lymph nodes: Secondary | ICD-10-CM | POA: Insufficient documentation

## 2024-01-06 DIAGNOSIS — C44311 Basal cell carcinoma of skin of nose: Secondary | ICD-10-CM | POA: Diagnosis present

## 2024-01-06 DIAGNOSIS — M950 Acquired deformity of nose: Secondary | ICD-10-CM | POA: Insufficient documentation

## 2024-01-06 DIAGNOSIS — J449 Chronic obstructive pulmonary disease, unspecified: Secondary | ICD-10-CM | POA: Diagnosis not present

## 2024-01-06 DIAGNOSIS — I10 Essential (primary) hypertension: Secondary | ICD-10-CM | POA: Diagnosis not present

## 2024-01-06 DIAGNOSIS — L299 Pruritus, unspecified: Secondary | ICD-10-CM | POA: Insufficient documentation

## 2024-01-06 DIAGNOSIS — C44310 Basal cell carcinoma of skin of unspecified parts of face: Secondary | ICD-10-CM

## 2024-01-06 DIAGNOSIS — C4441 Basal cell carcinoma of skin of scalp and neck: Secondary | ICD-10-CM | POA: Diagnosis present

## 2024-01-06 DIAGNOSIS — G35 Multiple sclerosis: Secondary | ICD-10-CM | POA: Diagnosis not present

## 2024-01-06 DIAGNOSIS — Z89512 Acquired absence of left leg below knee: Secondary | ICD-10-CM | POA: Diagnosis not present

## 2024-01-06 DIAGNOSIS — Z79899 Other long term (current) drug therapy: Secondary | ICD-10-CM | POA: Diagnosis not present

## 2024-01-06 DIAGNOSIS — Z66 Do not resuscitate: Secondary | ICD-10-CM | POA: Diagnosis not present

## 2024-01-06 DIAGNOSIS — C8283 Other types of follicular lymphoma, intra-abdominal lymph nodes: Secondary | ICD-10-CM | POA: Diagnosis not present

## 2024-01-06 DIAGNOSIS — Z993 Dependence on wheelchair: Secondary | ICD-10-CM | POA: Insufficient documentation

## 2024-01-06 LAB — CMP (CANCER CENTER ONLY)
ALT: 11 U/L (ref 0–44)
AST: 16 U/L (ref 15–41)
Albumin: 4.1 g/dL (ref 3.5–5.0)
Alkaline Phosphatase: 84 U/L (ref 38–126)
Anion gap: 12 (ref 5–15)
BUN: 10 mg/dL (ref 8–23)
CO2: 27 mmol/L (ref 22–32)
Calcium: 10 mg/dL (ref 8.9–10.3)
Chloride: 98 mmol/L (ref 98–111)
Creatinine: 0.98 mg/dL (ref 0.44–1.00)
GFR, Estimated: >60 mL/min (ref >60)
Glucose, Bld: 114 mg/dL — ABNORMAL HIGH (ref 70–99)
Potassium: 4.1 mmol/L (ref 3.5–5.1)
Sodium: 136 mmol/L (ref 135–145)
Total Bilirubin: 0.4 mg/dL (ref 0.0–1.2)
Total Protein: 7.1 g/dL (ref 6.5–8.1)

## 2024-01-06 LAB — CBC WITH DIFFERENTIAL (CANCER CENTER ONLY)
Abs Immature Granulocytes: 0.02 K/uL (ref 0.00–0.07)
Basophils Absolute: 0.1 K/uL (ref 0.0–0.1)
Basophils Relative: 1 %
Eosinophils Absolute: 0.5 K/uL (ref 0.0–0.5)
Eosinophils Relative: 7 %
HCT: 39.1 % (ref 36.0–46.0)
Hemoglobin: 12.6 g/dL (ref 12.0–15.0)
Immature Granulocytes: 0 %
Lymphocytes Relative: 13 %
Lymphs Abs: 0.9 K/uL (ref 0.7–4.0)
MCH: 25.9 pg — ABNORMAL LOW (ref 26.0–34.0)
MCHC: 32.2 g/dL (ref 30.0–36.0)
MCV: 80.3 fL (ref 80.0–100.0)
Monocytes Absolute: 0.5 K/uL (ref 0.1–1.0)
Monocytes Relative: 7 %
Neutro Abs: 5 K/uL (ref 1.7–7.7)
Neutrophils Relative %: 72 %
Platelet Count: 212 K/uL (ref 150–400)
RBC: 4.87 MIL/uL (ref 3.87–5.11)
RDW: 14.9 % (ref 11.5–15.5)
WBC Count: 7 K/uL (ref 4.0–10.5)
nRBC: 0 % (ref 0.0–0.2)

## 2024-01-06 LAB — MAGNESIUM: Magnesium: 1.8 mg/dL (ref 1.7–2.4)

## 2024-01-06 NOTE — Assessment & Plan Note (Addendum)
 Basal cell carcinoma excised from the scalp in March 2025, previously excised from tip of nose in 2024. Concern for future recurrence.   Previously I discussed vismodegib  for recurrence prevention. Side effects include electrolyte disturbances (hypokalemia, hypomagnesemia), alopecia in nearly two-thirds, weight loss, dysgeusia, anorexia, gastrointestinal disturbances (diarrhea or constipation, nausea, vomiting), fatigue, muscle spasms, and rare skin rash.  Radiation therapy considered for close margins or residual cancer, but not currently indicated. She agreed to try vismodegib .  CBCD and CMP are grossly unremarkable today.  LDH normal recently.  In June 2025, she was started on vismodegib  150 mg daily and medication was delivered to her nursing home through specialty pharmacy.  She seems to be tolerating this fairly well.  Plan is to continue current dosing.  Labs reveal no dose-limiting toxicities.  - Schedule follow-up in 8 weeks to assess vismodegib  tolerance

## 2024-01-06 NOTE — Assessment & Plan Note (Signed)
 Treated with rituximab  x 5 doses in 2018, followed by radiation therapy to the inguinal area.  In remission.

## 2024-01-06 NOTE — Progress Notes (Signed)
 [facility]  ONCOLOGY CLINIC PROGRESS NOTE   Patient Care Team: Lenon Layman ORN, MD as PCP - General (Internal Medicine) Lada, Newell SQUIBB, MD as Attending Physician (Family Medicine) Dellie Louanne MATSU, MD (General Surgery) Twylla Glendia BROCKS, MD (Urology) Luciano Standing, MD as Consulting Physician (Otolaryngology)  PATIENT NAME: [name]   MR#: [id number] DOB: 1950/12/13  Date of visit: 01/06/2024   ASSESSMENT & PLAN:   [name] is a 73 y.o. lady with a complex past medical history of multiple sclerosis, peripheral vascular disease status post left BKA, wheelchair dependence, COPD, hypertension, morbid obesity, was referred to our clinic for consideration of vismodegib  therapy for her history of basal cell skin cancer of nose and scalp.   Basal cell carcinoma (BCC) of face Basal cell carcinoma excised from the scalp in March 2025, previously excised from tip of nose in 2024. Concern for future recurrence.   Previously I discussed vismodegib  for recurrence prevention. Side effects include electrolyte disturbances (hypokalemia, hypomagnesemia), alopecia in nearly two-thirds, weight loss, dysgeusia, anorexia, gastrointestinal disturbances (diarrhea or constipation, nausea, vomiting), fatigue, muscle spasms, and rare skin rash.  Radiation therapy considered for close margins or residual cancer, but not currently indicated. She agreed to try vismodegib .  CBCD and CMP are grossly unremarkable today.  LDH normal recently.  In June 2025, she was started on vismodegib  150 mg daily and medication was delivered to her nursing home through specialty pharmacy.  She seems to be tolerating this fairly well.  Plan is to continue current dosing.  Labs reveal no dose-limiting toxicities.  - Schedule follow-up in 8 weeks to assess vismodegib  tolerance  Follicular lymphoma of intra-abdominal lymph nodes (HCC) Treated with rituximab  x 5 doses in 2018, followed by  radiation therapy to the inguinal area.  In remission.   I reviewed lab results and outside records for this visit and discussed relevant results with the patient. Diagnosis, plan of care and treatment options were also discussed in detail with the patient. Opportunity provided to ask questions and answers provided to her apparent satisfaction. Provided instructions to call our clinic with any problems, questions or concerns prior to return visit. I recommended to continue follow-up with PCP and sub-specialists. She verbalized understanding and agreed with the plan.   NCCN guidelines have been consulted in the planning of this patient's care.  I spent a total of 30 minutes during this encounter with the patient including review of chart and various tests results, discussions about plan of care and coordination of care plan.   Chinita Patten, MD  01/06/2024 4:07 PM  [facility] A DEPT OF [facility] [id number] [place] [address] Dept: [phone] Dept Fax: [phone]    CHIEF COMPLAINT/ REASON FOR VISIT:   Basal cell carcinoma of the skin, recurrent on the face and scalp  Current Treatment: Vismodegib  150 mg p.o. daily started from June 2025.  INTERVAL HISTORY:    Discussed the use of AI scribe software for clinical note transcription with the patient, who gave verbal consent to proceed.  History of Present Illness  [name] is a 73 year old female with skin cancer who presents for follow-up on her chemotherapy treatment.  She is currently taking vismodegib  pill as part of her treatment regimen for skin cancer. No noticeable difference from the medication. No nausea, vomiting, constipation, or diarrhea. Her medication regimen also includes a stool softener.  She has  been eating well with no issues with bowel movements.  She resides in a nursing home.   I have reviewed the past medical  history, past surgical history, social history and family history with the patient and they are unchanged from previous note.  HISTORY OF PRESENT ILLNESS:   ONCOLOGY HISTORY:   She was diagnosed with stage II follicular lymphoma in March 2018.  Treated with full dose of rituximab  from April to July 2018 followed by radiation to inguinal region.  Treatments were given at [facility].  She remained in remission after this.   She was dealing with a blister on her nose at least since 2023, which she initially thought was a fever blister that would eventually disappear.  It started to itch and growing in size.  Hence a referral was sent to Dr. Lloyd, dermatology.  She was diagnosed with basal cell skin cancer.  Patient was referred to Dr. Luciano for coordination of repair of her large basal cell carcinoma of the nose.  It was at least 2 cm in diameter on the tip of the nose, effacing almost the entire nasal tip extending into the dorsum, the right side wall, the right ala. Anticipated surgical defect would probably involve at least from one third to one half of the nose. No cervical lymphadenopathy.    She eventually underwent Mohs surgery and sustained acquired nasal deformity.   On 07/25/2023, she had repair of nasal Mohs defect with interpolated pedicled paramedian forehead flap 3.5 x 2.5cm, right auricular cartilage graft harvest for right nasal alar reconstruction, excision wound of head 3 x 2.5cm in preparation for nasal flap, repair of forehead donor site with 3 x 2.5cm integra acellular matrix and shave biopsy malignant neoplasm of scalp.  Biopsies from the scalp also showed basal cell carcinoma, nodular type, margins negative for carcinoma.   On 09/24/2023, she underwent division and inset of pedicle forehead flap to nose.   Dr. Luciano discussed wide local excision with secondary reconstruction versus a referral to oncology for vismodegib  therapy.  Patient opted to establish  with medical oncology.   On her consultation with us  on 10/20/2023, discussed vismodegib  150 mg orally daily with dose titration depending on side effects.  Patient agreeable.   REVIEW OF SYSTEMS:   Review of Systems - Oncology  All other pertinent systems were reviewed with the patient and are negative.  ALLERGIES: She has no known allergies.  MEDICATIONS:  Current Outpatient Medications  Medication Sig Dispense Refill   acetaminophen  (TYLENOL ) 500 MG tablet Take 500 mg by mouth every 8 (eight) hours as needed for moderate pain (pain score 4-6).     albuterol  (PROVENTIL ) (2.5 MG/3ML) 0.083% nebulizer solution Take 2.5 mg by nebulization every 4 (four) hours as needed for wheezing or shortness of breath.     amLODipine  (NORVASC ) 5 MG tablet Take 5 mg by mouth daily.     budesonide-formoterol  (SYMBICORT) 160-4.5 MCG/ACT inhaler Inhale 2 puffs into the lungs 2 (two) times daily.      buPROPion  (WELLBUTRIN  XL) 300 MG 24 hr tablet Take 300 mg by mouth daily.     chlorhexidine  (HIBICLENS ) 4 % external liquid Apply 1 Application topically daily.     cholecalciferol (VITAMIN D3) 25 MCG (1000 UNIT) tablet Take 2,000 Units by mouth daily.     clonazePAM  (KLONOPIN ) 0.5 MG tablet Take 1 tablet (0.5 mg total) by mouth 2 (two) times daily as needed for anxiety. Home med. 10 tablet 0   Cranberry 400  MG CAPS Take 400 mg by mouth 2 (two) times daily.     docusate sodium  (COLACE) 100 MG capsule Take 1 capsule (100 mg total) by mouth 2 (two) times daily as needed.     DULoxetine  (CYMBALTA ) 60 MG capsule Take 1 capsule (60 mg total) by mouth every morning. 30 capsule 3   ferrous sulfate 324 MG TBEC Take 324 mg by mouth every other day.     gabapentin  (NEURONTIN ) 600 MG tablet Take 600 mg by mouth 3 (three) times daily.     hydrOXYzine (ATARAX) 25 MG tablet Take 50 mg by mouth at bedtime.     magnesium  oxide (MAG-OX) 400 (240 Mg) MG tablet Take 400 mg by mouth daily.     Multiple Vitamin (MULTIVITAMIN WITH  MINERALS) TABS tablet Take 1 tablet by mouth daily.     naloxone (NARCAN) nasal spray 4 mg/0.1 mL Place 1 spray into the nose 3 (three) times daily as needed (symptoms of opioid overdose).     nystatin  powder Apply 1 Application topically 2 (two) times daily. Under right breast and abdominal folds and back folds for yeast.     oxyCODONE  (OXY IR/ROXICODONE ) 5 MG immediate release tablet Take 1 tablet (5 mg total) by mouth every 8 (eight) hours as needed for moderate pain or severe pain. Home med. 10 tablet 0   QUEtiapine  Fumarate 150 MG TABS Take 150 mg by mouth at bedtime.     traZODone  (DESYREL ) 50 MG tablet To be resumed by facility physician as deemed appropriate.     vismodegib  (ERIVEDGE ) 150 MG capsule Take 1 capsule (150 mg total) by mouth daily. 30 capsule 3   vortioxetine  HBr (TRINTELLIX ) 5 MG TABS tablet Duplicate psych med.  To be resumed by facility physician as deemed appropriate. (Patient taking differently: Take 5 mg by mouth daily.) 30 tablet    No current facility-administered medications for this visit.   Facility-Administered Medications Ordered in Other Visits  Medication Dose Route Frequency Provider Last Rate Last Admin   heparin  lock flush 100 unit/mL  500 Units Intracatheter Once PRN Rao, Archana C, MD         VITALS:   Blood pressure 124/69, pulse (!) 50, temperature 97.6 F (36.4 C), temperature source Oral, resp. rate 18, SpO2 100%.  Wt Readings from Last 3 Encounters:  10/20/23 276 lb 3.2 oz (125.3 kg)  09/24/23 264 lb (119.7 kg)  07/25/23 275 lb (124.7 kg)    There is no height or weight on file to calculate BMI.    Onc Performance Status - 01/06/24 0936       ECOG Perf Status   ECOG Perf Status Completely disabled.  Cannot carry on any selfcare.  Totally confined to bed or chair      KPS SCALE   KPS % SCORE Disabled, requires special care and assistance          PHYSICAL EXAM:   Physical Exam Constitutional:      General: She is not in acute  distress.    Appearance: Normal appearance.  HENT:     Head: Normocephalic and atraumatic.     Nose:     Comments:  Reconstruction well-healed with good cosmetic result Cardiovascular:     Rate and Rhythm: Normal rate.  Pulmonary:     Effort: Pulmonary effort is normal. No respiratory distress.  Abdominal:     General: There is no distension.  Musculoskeletal:     Comments: Left BKA  Skin:  Comments: Scars on the left side of scalp/ forehead are well healed   Neurological:     Mental Status: She is alert.       LABORATORY DATA:   I have reviewed the data as listed.  Results for orders placed or performed in visit on 01/06/24  Magnesium   Result Value Ref Range   Magnesium  1.8 1.7 - 2.4 mg/dL  CMP (Cancer Center only)  Result Value Ref Range   Sodium 136 135 - 145 mmol/L   Potassium 4.1 3.5 - 5.1 mmol/L   Chloride 98 98 - 111 mmol/L   CO2 27 22 - 32 mmol/L   Glucose, Bld 114 (H) 70 - 99 mg/dL   BUN 10 8 - 23 mg/dL   Creatinine 9.01 9.55 - 1.00 mg/dL   Calcium  10.0 8.9 - 10.3 mg/dL   Total Protein 7.1 6.5 - 8.1 g/dL   Albumin  4.1 3.5 - 5.0 g/dL   AST 16 15 - 41 U/L   ALT 11 0 - 44 U/L   Alkaline Phosphatase 84 38 - 126 U/L   Total Bilirubin 0.4 0.0 - 1.2 mg/dL   GFR, Estimated >39 >39 mL/min   Anion gap 12 5 - 15  CBC with Differential (Cancer Center Only)  Result Value Ref Range   WBC Count 7.0 4.0 - 10.5 K/uL   RBC 4.87 3.87 - 5.11 MIL/uL   Hemoglobin 12.6 12.0 - 15.0 g/dL   HCT 60.8 63.9 - 53.9 %   MCV 80.3 80.0 - 100.0 fL   MCH 25.9 (L) 26.0 - 34.0 pg   MCHC 32.2 30.0 - 36.0 g/dL   RDW 85.0 88.4 - 84.4 %   Platelet Count 212 150 - 400 K/uL   nRBC 0.0 0.0 - 0.2 %   Neutrophils Relative % 72 %   Neutro Abs 5.0 1.7 - 7.7 K/uL   Lymphocytes Relative 13 %   Lymphs Abs 0.9 0.7 - 4.0 K/uL   Monocytes Relative 7 %   Monocytes Absolute 0.5 0.1 - 1.0 K/uL   Eosinophils Relative 7 %   Eosinophils Absolute 0.5 0.0 - 0.5 K/uL   Basophils Relative 1 %    Basophils Absolute 0.1 0.0 - 0.1 K/uL   Immature Granulocytes 0 %   Abs Immature Granulocytes 0.02 0.00 - 0.07 K/uL      RADIOGRAPHIC STUDIES:  No recent pertinent imaging available to review  CODE STATUS:  Code Status History     Date Active Date Inactive Code Status Order ID Comments User Context   10/30/2022 1435 11/02/2022 1722 DNR [id number]  Eldonna Elspeth PARAS, MD ED   01/29/2022 2251 02/06/2022 2227 Full Code [id number]  Jurline Rockie CROME, NP Inpatient   01/25/2022 1429 01/29/2022 2251 DNR [id number]  Tina Tobey Jama LOISE, NP Inpatient   01/23/2022 1559 01/25/2022 1429 Full Code [id number]  Parris Manna, MD ED   01/10/2022 0915 01/12/2022 2251 Full Code [id number]  Lanetta Lingo, MD ED   12/22/2021 1155 12/27/2021 0016 Full Code [id number]  Cox, Amy N, DO ED   06/30/2021 1035 07/05/2021 2227 Full Code [id number]  Lanetta Lingo, MD ED   03/23/2021 2016 03/29/2021 2002 Full Code [id number]  Jens Durand, MD Inpatient   02/04/2020 0240 02/10/2020 1803 Full Code [id number]  Cox, Amy N, DO Inpatient   02/04/2020 0201 02/04/2020 0240 Full Code [id number]  Cox, Amy N, DO Inpatient   01/26/2020 2347 02/03/2020 2347 Full Code [id number]  Von Bellis, MD ED   01/01/2020 0628 01/06/2020 2118 Full  Code [id number]  Chotiner, Adine RAMAN, MD Inpatient   11/19/2019 1150 11/24/2019 2054 DNR [id number]  Lanetta Lingo, MD ED   11/19/2019 1133 11/19/2019 1150 Full Code [id number]  Lanetta Lingo, MD ED   10/17/2019 1927 10/22/2019 2129 Full Code [id number]  Sim Emery CROME, MD ED   06/20/2019 1654 06/23/2019 0206 Full Code [id number]  Tobie Mario GAILS, MD ED   04/20/2019 2116 04/24/2019 1710 Full Code [id number]  Mansy, Madison LABOR, MD ED   02/02/2019 1553 02/09/2019 2027 Full Code [id number]  Yisroel Sleight, MD ED   01/05/2019 0647 01/08/2019 2202 Full Code [id number]  Stephania Ozell RAMAN ED   06/11/2018 2158 06/14/2018 1807 Full Code [id number]  Lonzo Pollen, MD Inpatient   07/07/2017 2000 07/08/2017 1631 Full Code [id number]  Tobie Press, MD  Inpatient   05/14/2017 0152 05/15/2017 1953 Full Code [id number]  Jenel Lenis, MD Inpatient   04/01/2017 1723 04/05/2017 1742 Full Code [id number]  Gwendlyn Kevin LABOR, RN Inpatient   02/15/2017 1715 02/17/2017 2008 Full Code [id number]  Runell Redia PARAS, MD Inpatient   02/06/2017 2356 02/09/2017 1858 Full Code [id number]  Tobie Press, MD Inpatient   01/02/2017 1903 01/06/2017 1848 Full Code [id number]  Yisroel Sleight, MD ED   12/29/2016 1252 12/30/2016 1722 Full Code [id number]  Andreas Nissen, MD Inpatient   10/11/2016 2320 10/14/2016 1726 Full Code [id number]  Grayland Bur, MD Inpatient   08/28/2016 1159 08/30/2016 1932 Full Code [id number]  Josette Ade, MD ED   12/18/2015 1956 12/19/2015 1939 Full Code [id number]  Hower, Lenis POUR, MD ED    Questions for Most Recent Historical Code Status (Order [id number])     Question Answer   If patient has no pulse and is not breathing Do Not Attempt Resuscitation   If patient has a pulse and/or is breathing: Medical Treatment Goals LIMITED ADDITIONAL INTERVENTIONS: Use medication/IV fluids and cardiac monitoring as indicated; Do not use intubation or mechanical ventilation (DNI), also provide comfort medications.  Transfer to Progressive/Stepdown as indicated, avoid Intensive Care.   Consent: Discussion documented in EHR or advanced directives reviewed            Orders Placed This Encounter  Procedures   CBC with Differential (Cancer Center Only)    Standing Status:   Future    Expected Date:   03/02/2024    Expiration Date:   05/31/2024   CMP (Cancer Center only)    Standing Status:   Future    Expected Date:   03/02/2024    Expiration Date:   05/31/2024   Magnesium     Standing Status:   Future    Expected Date:   03/02/2024    Expiration Date:   05/31/2024   Lactate dehydrogenase    Standing Status:   Future    Expected Date:   03/02/2024    Expiration Date:   05/31/2024     Future Appointments  Date Time Provider Department Center  03/02/2024  9:30 AM  [facility] PHLEBOTOMIST [facility] None  03/02/2024  9:45 AM Micholas Drumwright, Chinita, MD [facility] None     This document was completed utilizing speech recognition software. Grammatical errors, random word insertions, pronoun errors, and incomplete sentences are an occasional consequence of this system due to software limitations, ambient noise, and hardware issues. Any formal questions or concerns about the content, text or information contained within the body of this dictation should be directly addressed to the provider for clarification.

## 2024-01-20 ENCOUNTER — Other Ambulatory Visit (HOSPITAL_COMMUNITY): Payer: Self-pay

## 2024-01-22 ENCOUNTER — Other Ambulatory Visit: Payer: Self-pay

## 2024-01-22 ENCOUNTER — Other Ambulatory Visit (HOSPITAL_COMMUNITY): Payer: Self-pay

## 2024-01-22 NOTE — Progress Notes (Signed)
 Specialty Pharmacy Refill Coordination Note  [name] is a 73 y.o. female contacted today regarding refills of specialty medication(s) Vismodegib  (ERIVEDGE )   Patient requested Delivery   Delivery date: 01/27/24   Verified address: [facility] [place] ATTN: Raylie Maddison Rm# [address] [place], [place] [address]   Medication will be filled on 01/26/24.

## 2024-01-22 NOTE — Progress Notes (Signed)
 Specialty Pharmacy Ongoing Clinical Assessment Note  I spoke with patient's nurse at LTC facility. ALEJANDRO ADCOX is a 73 y.o. female who is being followed by the specialty pharmacy service for RxSp Oncology   Patient's specialty medication(s) reviewed today: Vismodegib  (ERIVEDGE )   Missed doses in the last 4 weeks: 0   Patient/Caregiver did not have any additional questions or concerns.   Therapeutic benefit summary: Patient is achieving benefit   Adverse events/side effects summary: No adverse events/side effects   Patient's therapy is appropriate to: Continue    Goals Addressed             This Visit's Progress    Maintain optimal adherence to therapy   On track    Patient is on track. Patient will maintain adherence         Follow up: 3 months  Silvano LOISE Dolly Specialty Pharmacist

## 2024-01-26 ENCOUNTER — Other Ambulatory Visit: Payer: Self-pay

## 2024-02-16 ENCOUNTER — Other Ambulatory Visit (HOSPITAL_COMMUNITY): Payer: Self-pay

## 2024-02-23 ENCOUNTER — Other Ambulatory Visit (HOSPITAL_COMMUNITY): Payer: Self-pay

## 2024-02-23 ENCOUNTER — Other Ambulatory Visit: Payer: Self-pay | Admitting: Oncology

## 2024-02-25 ENCOUNTER — Other Ambulatory Visit (HOSPITAL_COMMUNITY): Payer: Self-pay

## 2024-02-25 ENCOUNTER — Other Ambulatory Visit: Payer: Self-pay | Admitting: Oncology

## 2024-02-25 ENCOUNTER — Other Ambulatory Visit: Payer: Self-pay

## 2024-02-25 ENCOUNTER — Other Ambulatory Visit: Payer: Self-pay | Admitting: Pharmacy Technician

## 2024-02-25 MED ORDER — ERIVEDGE 150 MG PO CAPS
150.0000 mg | ORAL_CAPSULE | Freq: Every day | ORAL | 3 refills | Status: AC
  Filled 2024-02-25: qty 28, 28d supply, fill #0
  Filled 2024-03-17 – 2024-04-06 (×2): qty 28, 28d supply, fill #1
  Filled 2024-04-26 – 2024-04-27 (×2): qty 28, 28d supply, fill #2
  Filled 2024-05-26: qty 28, 28d supply, fill #3

## 2024-02-25 NOTE — Progress Notes (Signed)
 Specialty Pharmacy Refill Coordination Note  [name] is a 73 y.o. female contacted today regarding refills of specialty medication(s) Vismodegib  (ERIVEDGE )  Spoke with Brandi   Patient requested Delivery   Delivery date: 02/27/24   Verified address: [facility] [place] ATTN: Janaki Exley [id number]   Medication will be filled on: 02/26/24  This fill date is pending response to refill request from provider. Patient is aware and if they have not received fill by intended date they must follow up with pharmacy.

## 2024-02-26 ENCOUNTER — Other Ambulatory Visit: Payer: Self-pay

## 2024-03-02 ENCOUNTER — Inpatient Hospital Stay: Admitting: Oncology

## 2024-03-02 ENCOUNTER — Inpatient Hospital Stay

## 2024-03-02 ENCOUNTER — Telehealth: Payer: Self-pay | Admitting: Oncology

## 2024-03-02 ENCOUNTER — Telehealth: Payer: Self-pay

## 2024-03-02 NOTE — Telephone Encounter (Signed)
 Spoke with [name] to get PT appt rescheduled, day and time confirmed.

## 2024-03-02 NOTE — Telephone Encounter (Signed)
 I spoke with a staff person at the skilled nursing facility at [phone] regarding the unattended Oncology appointment with Dr. Autumn along with a lab appointment. Staff person said it is because they don't have the staffing.   Staff person agreed to get the patient rescheduled and understands that a scheduler will be reaching out to her.

## 2024-03-08 ENCOUNTER — Encounter: Payer: Self-pay | Admitting: Oncology

## 2024-03-08 ENCOUNTER — Telehealth: Payer: Self-pay | Admitting: Oncology

## 2024-03-08 ENCOUNTER — Inpatient Hospital Stay

## 2024-03-08 ENCOUNTER — Inpatient Hospital Stay: Attending: Oncology | Admitting: Oncology

## 2024-03-08 VITALS — BP 102/71 | HR 50 | Temp 97.6°F | Resp 18 | Ht 66.0 in | Wt 279.3 lb

## 2024-03-08 DIAGNOSIS — J449 Chronic obstructive pulmonary disease, unspecified: Secondary | ICD-10-CM | POA: Insufficient documentation

## 2024-03-08 DIAGNOSIS — I10 Essential (primary) hypertension: Secondary | ICD-10-CM | POA: Diagnosis not present

## 2024-03-08 DIAGNOSIS — C4441 Basal cell carcinoma of skin of scalp and neck: Secondary | ICD-10-CM | POA: Diagnosis present

## 2024-03-08 DIAGNOSIS — Z89512 Acquired absence of left leg below knee: Secondary | ICD-10-CM | POA: Diagnosis not present

## 2024-03-08 DIAGNOSIS — M950 Acquired deformity of nose: Secondary | ICD-10-CM | POA: Insufficient documentation

## 2024-03-08 DIAGNOSIS — Z923 Personal history of irradiation: Secondary | ICD-10-CM | POA: Diagnosis not present

## 2024-03-08 DIAGNOSIS — L299 Pruritus, unspecified: Secondary | ICD-10-CM | POA: Diagnosis not present

## 2024-03-08 DIAGNOSIS — Z79899 Other long term (current) drug therapy: Secondary | ICD-10-CM | POA: Diagnosis not present

## 2024-03-08 DIAGNOSIS — I739 Peripheral vascular disease, unspecified: Secondary | ICD-10-CM | POA: Insufficient documentation

## 2024-03-08 DIAGNOSIS — C8283 Other types of follicular lymphoma, intra-abdominal lymph nodes: Secondary | ICD-10-CM

## 2024-03-08 DIAGNOSIS — C44310 Basal cell carcinoma of skin of unspecified parts of face: Secondary | ICD-10-CM

## 2024-03-08 DIAGNOSIS — C44311 Basal cell carcinoma of skin of nose: Secondary | ICD-10-CM | POA: Insufficient documentation

## 2024-03-08 DIAGNOSIS — G35D Multiple sclerosis, unspecified: Secondary | ICD-10-CM | POA: Diagnosis not present

## 2024-03-08 DIAGNOSIS — Z993 Dependence on wheelchair: Secondary | ICD-10-CM | POA: Diagnosis not present

## 2024-03-08 LAB — CMP (CANCER CENTER ONLY)
ALT: 28 U/L (ref 0–44)
AST: 16 U/L (ref 15–41)
Albumin: 4.1 g/dL (ref 3.5–5.0)
Alkaline Phosphatase: 96 U/L (ref 38–126)
Anion gap: 11 (ref 5–15)
BUN: 12 mg/dL (ref 8–23)
CO2: 28 mmol/L (ref 22–32)
Calcium: 10.1 mg/dL (ref 8.9–10.3)
Chloride: 97 mmol/L — ABNORMAL LOW (ref 98–111)
Creatinine: 1.01 mg/dL — ABNORMAL HIGH (ref 0.44–1.00)
GFR, Estimated: 58 mL/min — ABNORMAL LOW (ref >60)
Glucose, Bld: 123 mg/dL — ABNORMAL HIGH (ref 70–99)
Potassium: 4.2 mmol/L (ref 3.5–5.1)
Sodium: 135 mmol/L (ref 135–145)
Total Bilirubin: 0.5 mg/dL (ref 0.0–1.2)
Total Protein: 7.1 g/dL (ref 6.5–8.1)

## 2024-03-08 LAB — CBC WITH DIFFERENTIAL (CANCER CENTER ONLY)
Abs Immature Granulocytes: 0.04 K/uL (ref 0.00–0.07)
Basophils Absolute: 0.1 K/uL (ref 0.0–0.1)
Basophils Relative: 1 %
Eosinophils Absolute: 0.5 K/uL (ref 0.0–0.5)
Eosinophils Relative: 7 %
HCT: 42.7 % (ref 36.0–46.0)
Hemoglobin: 13.9 g/dL (ref 12.0–15.0)
Immature Granulocytes: 1 %
Lymphocytes Relative: 12 %
Lymphs Abs: 0.9 K/uL (ref 0.7–4.0)
MCH: 26.6 pg (ref 26.0–34.0)
MCHC: 32.6 g/dL (ref 30.0–36.0)
MCV: 81.8 fL (ref 80.0–100.0)
Monocytes Absolute: 0.5 K/uL (ref 0.1–1.0)
Monocytes Relative: 7 %
Neutro Abs: 5.2 K/uL (ref 1.7–7.7)
Neutrophils Relative %: 72 %
Platelet Count: 219 K/uL (ref 150–400)
RBC: 5.22 MIL/uL — ABNORMAL HIGH (ref 3.87–5.11)
RDW: 15.9 % — ABNORMAL HIGH (ref 11.5–15.5)
WBC Count: 7.2 K/uL (ref 4.0–10.5)
nRBC: 0 % (ref 0.0–0.2)

## 2024-03-08 LAB — MAGNESIUM: Magnesium: 1.9 mg/dL (ref 1.7–2.4)

## 2024-03-08 LAB — LACTATE DEHYDROGENASE: LDH: 171 U/L (ref 98–192)

## 2024-03-08 NOTE — Telephone Encounter (Signed)
 Spoke with [name] to sch appts on 05/10/24

## 2024-03-08 NOTE — Assessment & Plan Note (Signed)
 Treated with rituximab  x 5 doses in 2018, followed by radiation therapy to the inguinal area.  In remission.
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
## 2024-03-08 NOTE — Assessment & Plan Note (Addendum)
 Basal cell carcinoma excised from the scalp in March 2025, previously excised from tip of nose in 2024. Concern for future recurrence.   Previously I discussed vismodegib  for recurrence prevention. Side effects include electrolyte disturbances (hypokalemia, hypomagnesemia), alopecia in nearly two-thirds, weight loss, dysgeusia, anorexia, gastrointestinal disturbances (diarrhea or constipation, nausea, vomiting), fatigue, muscle spasms, and rare skin rash.  Radiation therapy considered for close margins or residual cancer, but not currently indicated. She agreed to try vismodegib .  In June 2025, she was started on vismodegib  150 mg daily and medication was delivered to her nursing home through specialty pharmacy.  She seems to be tolerating this fairly well.  Plan is to continue current dosing.  Labs reveal no dose-limiting toxicities.  On exam, she seems to have some new actinic keratoses.  No other concerning lesions.  - Schedule follow-up in 2 months to assess vismodegib  tolerance

## 2024-03-11 ENCOUNTER — Other Ambulatory Visit: Payer: Self-pay | Admitting: Internal Medicine

## 2024-03-11 DIAGNOSIS — Z1231 Encounter for screening mammogram for malignant neoplasm of breast: Secondary | ICD-10-CM

## 2024-03-17 ENCOUNTER — Other Ambulatory Visit: Payer: Self-pay

## 2024-03-19 ENCOUNTER — Other Ambulatory Visit (HOSPITAL_COMMUNITY): Payer: Self-pay

## 2024-03-23 ENCOUNTER — Other Ambulatory Visit (HOSPITAL_COMMUNITY): Payer: Self-pay

## 2024-04-06 ENCOUNTER — Other Ambulatory Visit: Payer: Self-pay

## 2024-04-06 ENCOUNTER — Other Ambulatory Visit (HOSPITAL_COMMUNITY): Payer: Self-pay

## 2024-04-06 NOTE — Progress Notes (Signed)
 Specialty Pharmacy Refill Coordination Note  [name] is a 73 y.o. female contacted today regarding refills of specialty medication(s) Vismodegib  (Erivedge )   Patient requested Delivery   Delivery date: 04/07/24   Verified address: [facility] [place] ATTN: Joselin Crandell Rm# [id number], [address]   Medication will be filled on: 04/06/24

## 2024-04-06 NOTE — Progress Notes (Signed)
 Specialty Pharmacy Ongoing Clinical Assessment Note  [name] is a 73 y.o. female who is being followed by the specialty pharmacy service for RxSp Oncology   Patient's specialty medication(s) reviewed today: Vismodegib  (Erivedge )   Missed doses in the last 4 weeks: 0   Patient/Caregiver did not have any additional questions or concerns.   Therapeutic benefit summary: Patient is achieving benefit   Adverse events/side effects summary: No adverse events/side effects   Patient's therapy is appropriate to: Continue    Goals Addressed             This Visit's Progress    Maintain optimal adherence to therapy   On track    Patient is on track. Patient will maintain adherence         Follow up: 3 months  [facility] Specialty Pharmacist

## 2024-04-15 ENCOUNTER — Inpatient Hospital Stay: Admission: RE | Admit: 2024-04-15

## 2024-04-15 DIAGNOSIS — Z1231 Encounter for screening mammogram for malignant neoplasm of breast: Secondary | ICD-10-CM | POA: Diagnosis present

## 2024-04-26 ENCOUNTER — Other Ambulatory Visit: Payer: Self-pay

## 2024-04-27 ENCOUNTER — Other Ambulatory Visit: Payer: Self-pay

## 2024-04-28 ENCOUNTER — Other Ambulatory Visit: Payer: Self-pay

## 2024-04-30 ENCOUNTER — Other Ambulatory Visit: Payer: Self-pay | Admitting: Pharmacy Technician

## 2024-04-30 ENCOUNTER — Other Ambulatory Visit: Payer: Self-pay

## 2024-04-30 NOTE — Progress Notes (Signed)
 Specialty Pharmacy Refill Coordination Note  [name] is a 74 y.o. female contacted today regarding refills of specialty medication(s) Vismodegib  (Erivedge )   Patient requested Delivery   Delivery date: 05/04/24   Verified address: [facility] [place] ATTN: [name] Rm# [id number], [address],   Medication will be filled on: 05/03/24

## 2024-05-03 ENCOUNTER — Other Ambulatory Visit: Payer: Self-pay

## 2024-05-10 ENCOUNTER — Encounter: Payer: Self-pay | Admitting: Oncology

## 2024-05-10 ENCOUNTER — Inpatient Hospital Stay: Attending: Oncology

## 2024-05-10 ENCOUNTER — Inpatient Hospital Stay: Admitting: Oncology

## 2024-05-10 VITALS — BP 134/66 | HR 47 | Temp 96.6°F | Resp 17

## 2024-05-10 DIAGNOSIS — L299 Pruritus, unspecified: Secondary | ICD-10-CM | POA: Diagnosis not present

## 2024-05-10 DIAGNOSIS — C44311 Basal cell carcinoma of skin of nose: Secondary | ICD-10-CM | POA: Diagnosis present

## 2024-05-10 DIAGNOSIS — Z89512 Acquired absence of left leg below knee: Secondary | ICD-10-CM | POA: Diagnosis not present

## 2024-05-10 DIAGNOSIS — Z923 Personal history of irradiation: Secondary | ICD-10-CM | POA: Diagnosis not present

## 2024-05-10 DIAGNOSIS — B9789 Other viral agents as the cause of diseases classified elsewhere: Secondary | ICD-10-CM | POA: Insufficient documentation

## 2024-05-10 DIAGNOSIS — Z79899 Other long term (current) drug therapy: Secondary | ICD-10-CM | POA: Diagnosis not present

## 2024-05-10 DIAGNOSIS — G35D Multiple sclerosis, unspecified: Secondary | ICD-10-CM | POA: Diagnosis not present

## 2024-05-10 DIAGNOSIS — C44310 Basal cell carcinoma of skin of unspecified parts of face: Secondary | ICD-10-CM

## 2024-05-10 DIAGNOSIS — J069 Acute upper respiratory infection, unspecified: Secondary | ICD-10-CM | POA: Insufficient documentation

## 2024-05-10 DIAGNOSIS — J449 Chronic obstructive pulmonary disease, unspecified: Secondary | ICD-10-CM | POA: Diagnosis not present

## 2024-05-10 DIAGNOSIS — Z993 Dependence on wheelchair: Secondary | ICD-10-CM | POA: Diagnosis not present

## 2024-05-10 DIAGNOSIS — M950 Acquired deformity of nose: Secondary | ICD-10-CM | POA: Insufficient documentation

## 2024-05-10 DIAGNOSIS — I739 Peripheral vascular disease, unspecified: Secondary | ICD-10-CM | POA: Insufficient documentation

## 2024-05-10 DIAGNOSIS — I10 Essential (primary) hypertension: Secondary | ICD-10-CM | POA: Insufficient documentation

## 2024-05-10 DIAGNOSIS — C8283 Other types of follicular lymphoma, intra-abdominal lymph nodes: Secondary | ICD-10-CM

## 2024-05-10 LAB — CBC WITH DIFFERENTIAL (CANCER CENTER ONLY)
Abs Immature Granulocytes: 0.13 K/uL — ABNORMAL HIGH (ref 0.00–0.07)
Basophils Absolute: 0.1 K/uL (ref 0.0–0.1)
Basophils Relative: 1 %
Eosinophils Absolute: 0.2 K/uL (ref 0.0–0.5)
Eosinophils Relative: 2 %
HCT: 43.6 % (ref 36.0–46.0)
Hemoglobin: 14.1 g/dL (ref 12.0–15.0)
Immature Granulocytes: 1 %
Lymphocytes Relative: 9 %
Lymphs Abs: 1.1 K/uL (ref 0.7–4.0)
MCH: 26.8 pg (ref 26.0–34.0)
MCHC: 32.3 g/dL (ref 30.0–36.0)
MCV: 82.7 fL (ref 80.0–100.0)
Monocytes Absolute: 0.6 K/uL (ref 0.1–1.0)
Monocytes Relative: 5 %
Neutro Abs: 10.3 K/uL — ABNORMAL HIGH (ref 1.7–7.7)
Neutrophils Relative %: 82 %
Platelet Count: 266 K/uL (ref 150–400)
RBC: 5.27 MIL/uL — ABNORMAL HIGH (ref 3.87–5.11)
RDW: 15.1 % (ref 11.5–15.5)
WBC Count: 12.4 K/uL — ABNORMAL HIGH (ref 4.0–10.5)
nRBC: 0 % (ref 0.0–0.2)

## 2024-05-10 LAB — CMP (CANCER CENTER ONLY)
ALT: 16 U/L (ref 0–44)
AST: 16 U/L (ref 15–41)
Albumin: 4.2 g/dL (ref 3.5–5.0)
Alkaline Phosphatase: 75 U/L (ref 38–126)
Anion gap: 10 (ref 5–15)
BUN: 19 mg/dL (ref 8–23)
CO2: 30 mmol/L (ref 22–32)
Calcium: 10.6 mg/dL — ABNORMAL HIGH (ref 8.9–10.3)
Chloride: 95 mmol/L — ABNORMAL LOW (ref 98–111)
Creatinine: 1.06 mg/dL — ABNORMAL HIGH (ref 0.44–1.00)
GFR, Estimated: 55 mL/min — ABNORMAL LOW
Glucose, Bld: 144 mg/dL — ABNORMAL HIGH (ref 70–99)
Potassium: 4.3 mmol/L (ref 3.5–5.1)
Sodium: 134 mmol/L — ABNORMAL LOW (ref 135–145)
Total Bilirubin: 0.4 mg/dL (ref 0.0–1.2)
Total Protein: 7.4 g/dL (ref 6.5–8.1)

## 2024-05-10 LAB — LACTATE DEHYDROGENASE: LDH: 184 U/L (ref 105–235)

## 2024-05-10 LAB — MAGNESIUM: Magnesium: 2 mg/dL (ref 1.7–2.4)

## 2024-05-10 NOTE — Assessment & Plan Note (Addendum)
 Basal cell carcinoma excised from the scalp in March 2025, previously excised from tip of nose in 2024. Concern for future recurrence.   Previously I discussed vismodegib  for recurrence prevention. Side effects include electrolyte disturbances (hypokalemia, hypomagnesemia), alopecia in nearly two-thirds, weight loss, dysgeusia, anorexia, gastrointestinal disturbances (diarrhea or constipation, nausea, vomiting), fatigue, muscle spasms, and rare skin rash.  Radiation therapy considered for close margins or residual cancer, but not currently indicated. She agreed to try vismodegib .  In June 2025, she was started on vismodegib  150 mg daily and medication was delivered to her nursing home through specialty pharmacy.  She seems to be tolerating this fairly well.  Plan is to continue current dosing.  Labs reveal no dose-limiting toxicities.  On exam, she seems to have some new actinic keratoses.  No other concerning lesions.  - Schedule follow-up in 3 months to assess vismodegib  tolerance

## 2024-05-10 NOTE — Assessment & Plan Note (Signed)
 Treated with rituximab  x 5 doses in 2018, followed by radiation therapy to the inguinal area.  In remission.

## 2024-05-10 NOTE — Progress Notes (Signed)
 "  [facility]  ONCOLOGY CLINIC PROGRESS NOTE   Patient Care Team: Lenon Layman ORN, MD as PCP - General (Internal Medicine) Lada, Newell SQUIBB, MD as Attending Physician (Family Medicine) Dellie Louanne MATSU, MD (General Surgery) Twylla Glendia BROCKS, MD (Urology) Luciano Standing, MD as Consulting Physician (Otolaryngology)  PATIENT NAME: [name]   MR#: [id number] DOB: 1950/12/13  Date of visit: 05/10/2024   ASSESSMENT & PLAN:   [name] is a 74 y.o. lady with a complex past medical history of multiple sclerosis, peripheral vascular disease status post left BKA, wheelchair dependence, COPD, hypertension, morbid obesity, was referred to our clinic for consideration of vismodegib  therapy for her history of basal cell skin cancer of nose and scalp. Started vismodegib  in June 2025.  She also has a history of follicular lymphoma of intra-abdominal lymph nodes, treated in 2018 with rituximab  followed by radiation therapy.  In remission.  Basal cell carcinoma (BCC) of face Basal cell carcinoma excised from the scalp in March 2025, previously excised from tip of nose in 2024. Concern for future recurrence.   Previously I discussed vismodegib  for recurrence prevention. Side effects include electrolyte disturbances (hypokalemia, hypomagnesemia), alopecia in nearly two-thirds, weight loss, dysgeusia, anorexia, gastrointestinal disturbances (diarrhea or constipation, nausea, vomiting), fatigue, muscle spasms, and rare skin rash.  Radiation therapy considered for close margins or residual cancer, but not currently indicated. She agreed to try vismodegib .  In June 2025, she was started on vismodegib  150 mg daily and medication was delivered to her nursing home through specialty pharmacy.  She seems to be tolerating this fairly well.  Plan is to continue current dosing.  Labs reveal no dose-limiting toxicities.  On exam, she seems to have some new actinic keratoses.  No other  concerning lesions.  - Schedule follow-up in 3 months to assess vismodegib  tolerance  Follicular lymphoma of intra-abdominal lymph nodes (HCC) Treated with rituximab  x 5 doses in 2018, followed by radiation therapy to the inguinal area.  In remission.  Viral upper respiratory infection Recent mild viral upper respiratory infection with resolution of symptoms. Mild leukocytosis attributed to recent viral illness. No current concerning symptoms. - Monitored leukocyte count for changes. - Provided reassurance regarding symptom improvement.  I reviewed lab results and outside records for this visit and discussed relevant results with the patient. Diagnosis, plan of care and treatment options were also discussed in detail with the patient. Opportunity provided to ask questions and answers provided to her apparent satisfaction. Provided instructions to call our clinic with any problems, questions or concerns prior to return visit. I recommended to continue follow-up with PCP and sub-specialists. She verbalized understanding and agreed with the plan.   NCCN guidelines have been consulted in the planning of this patients care.  I spent a total of 32 minutes during this encounter with the patient including review of chart and various tests results, discussions about plan of care and coordination of care plan.   Chinita Patten, MD  05/10/2024 12:24 PM  [facility] - A DEPT OF [facility] [id number] [place] [address] Dept: [phone] Dept Fax: [phone]    CHIEF COMPLAINT/ REASON FOR VISIT:   Basal cell carcinoma of the skin, recurrent on the face and scalp  Current Treatment: Vismodegib  150 mg p.o. daily started from June 2025.  INTERVAL HISTORY:    Discussed the use of AI scribe software for clinical note transcription with the patient,  who gave verbal consent to proceed.  History of Present  Illness [name] is a 74 year old female with basal cell carcinoma of the face who presents for routine oncology follow-up.  She has been receiving vismodegib  (Erivedge ) therapy for basal cell carcinoma of the face for approximately six to seven months.  She denies nausea, vomiting, diarrhea, headaches, or visual disturbances. She endorses mild, non-persistent abdominal pain. Appetite remains good, particularly when palatable food is available, though she notes the food at her nursing home is nourishing but not optimal.  She denies current cough. She experienced mild cough and rhinorrhea two to three weeks ago, which have resolved. She denies recent loose stools.  She has been residing in a nursing home for several years, placed there by her husband, which she is dissatisfied with, feeling like a 'prisoner.' She also reports dissatisfaction with the food quality, stating meals are often cold and unappetizing, leading her to 'eat to live and not live to eat.'   I have reviewed the past medical history, past surgical history, social history and family history with the patient and they are unchanged from previous note.  HISTORY OF PRESENT ILLNESS:   ONCOLOGY HISTORY:   She was diagnosed with stage II follicular lymphoma in March 2018.  Treated with full dose of rituximab  from April to July 2018 followed by radiation to inguinal region.  Treatments were given at [facility].  She remained in remission after this.   She was dealing with a blister on her nose at least since 2023, which she initially thought was a fever blister that would eventually disappear.  It started to itch and growing in size.  Hence a referral was sent to Dr. Lloyd, dermatology.  She was diagnosed with basal cell skin cancer.  Patient was referred to Dr. Luciano for coordination of repair of her large basal cell carcinoma of the nose.  It was at least 2 cm in diameter on the tip of the nose, effacing  almost the entire nasal tip extending into the dorsum, the right side wall, the right ala. Anticipated surgical defect would probably involve at least from one third to one half of the nose. No cervical lymphadenopathy.    She eventually underwent Mohs surgery and sustained acquired nasal deformity.   On 07/25/2023, she had repair of nasal Mohs defect with interpolated pedicled paramedian forehead flap 3.5 x 2.5cm, right auricular cartilage graft harvest for right nasal alar reconstruction, excision wound of head 3 x 2.5cm in preparation for nasal flap, repair of forehead donor site with 3 x 2.5cm integra acellular matrix and shave biopsy malignant neoplasm of scalp.  Biopsies from the scalp also showed basal cell carcinoma, nodular type, margins negative for carcinoma.   On 09/24/2023, she underwent division and inset of pedicle forehead flap to nose.   Dr. Luciano discussed wide local excision with secondary reconstruction versus a referral to oncology for vismodegib  therapy.  Patient opted to establish with medical oncology.   On her consultation with us  on 10/20/2023, discussed vismodegib  150 mg orally daily with dose titration depending on side effects.  Started it in late June 2025.    REVIEW OF SYSTEMS:   Review of Systems - Oncology  All other pertinent systems were reviewed with the patient and are negative.  ALLERGIES: She has no known allergies.  MEDICATIONS:  Current Outpatient Medications  Medication Sig Dispense Refill   acetaminophen  (TYLENOL ) 500 MG tablet Take 500 mg by mouth every 8 (eight) hours  as needed for moderate pain (pain score 4-6).     albuterol  (PROVENTIL ) (2.5 MG/3ML) 0.083% nebulizer solution Take 2.5 mg by nebulization every 4 (four) hours as needed for wheezing or shortness of breath.     amLODipine  (NORVASC ) 5 MG tablet Take 5 mg by mouth daily.     budesonide-formoterol  (SYMBICORT) 160-4.5 MCG/ACT inhaler Inhale 2 puffs into the lungs 2 (two) times daily.       buPROPion  (WELLBUTRIN  XL) 300 MG 24 hr tablet Take 300 mg by mouth daily.     cetirizine (ZYRTEC) 10 MG tablet Take 10 mg by mouth daily.     chlorhexidine  (HIBICLENS ) 4 % external liquid Apply 1 Application topically daily.     cholecalciferol (VITAMIN D3) 25 MCG (1000 UNIT) tablet Take 2,000 Units by mouth daily.     clonazePAM  (KLONOPIN ) 0.5 MG tablet Take 1 tablet (0.5 mg total) by mouth 2 (two) times daily as needed for anxiety. Home med. 10 tablet 0   Cranberry 400 MG CAPS Take 400 mg by mouth 2 (two) times daily.     docusate sodium  (COLACE) 100 MG capsule Take 1 capsule (100 mg total) by mouth 2 (two) times daily as needed.     DULoxetine  (CYMBALTA ) 60 MG capsule Take 1 capsule (60 mg total) by mouth every morning. 30 capsule 3   ferrous sulfate 324 MG TBEC Take 324 mg by mouth every other day.     gabapentin  (NEURONTIN ) 600 MG tablet Take 600 mg by mouth 3 (three) times daily.     magnesium  oxide (MAG-OX) 400 (240 Mg) MG tablet Take 400 mg by mouth daily.     Multiple Vitamin (MULTIVITAMIN WITH MINERALS) TABS tablet Take 1 tablet by mouth daily.     naloxone (NARCAN) nasal spray 4 mg/0.1 mL Place 1 spray into the nose 3 (three) times daily as needed (symptoms of opioid overdose).     nystatin  powder Apply 1 Application topically 2 (two) times daily. Under right breast and abdominal folds and back folds for yeast.     oxyCODONE  (OXY IR/ROXICODONE ) 5 MG immediate release tablet Take 1 tablet (5 mg total) by mouth every 8 (eight) hours as needed for moderate pain or severe pain. Home med. 10 tablet 0   QUEtiapine  Fumarate 150 MG TABS Take 150 mg by mouth at bedtime.     traZODone  (DESYREL ) 50 MG tablet To be resumed by facility physician as deemed appropriate.     vismodegib  (ERIVEDGE ) 150 MG capsule Take 1 capsule (150 mg total) by mouth daily. 30 capsule 3   vortioxetine  HBr (TRINTELLIX ) 5 MG TABS tablet Duplicate psych med.  To be resumed by facility physician as deemed appropriate.  (Patient taking differently: Take 5 mg by mouth daily.) 30 tablet    No current facility-administered medications for this visit.   Facility-Administered Medications Ordered in Other Visits  Medication Dose Route Frequency Provider Last Rate Last Admin   heparin  lock flush 100 unit/mL  500 Units Intracatheter Once PRN Rao, Archana C, MD         VITALS:   Blood pressure 134/66, pulse (!) 47, temperature (!) 96.6 F (35.9 C), temperature source Temporal, resp. rate 17, SpO2 100%.  Wt Readings from Last 3 Encounters:  03/08/24 279 lb 4.8 oz (126.7 kg)  10/20/23 276 lb 3.2 oz (125.3 kg)  09/24/23 264 lb (119.7 kg)    There is no height or weight on file to calculate BMI.    Onc Performance Status - 05/10/24  1054       ECOG Perf Status   ECOG Perf Status Completely disabled.  Cannot carry on any selfcare.  Totally confined to bed or chair      KPS SCALE   KPS % SCORE Disabled, requires special care and assistance           PHYSICAL EXAM:   Physical Exam Constitutional:      General: She is not in acute distress.    Appearance: Normal appearance.  HENT:     Head: Normocephalic and atraumatic.     Nose:     Comments:  Reconstruction well-healed with good cosmetic result Cardiovascular:     Rate and Rhythm: Normal rate.  Pulmonary:     Effort: Pulmonary effort is normal. No respiratory distress.  Abdominal:     General: There is no distension.  Musculoskeletal:     Comments: Left BKA  Skin:    Comments: Scars on the left side of scalp/ forehead are well healed   Neurological:     Mental Status: She is alert.       LABORATORY DATA:   I have reviewed the data as listed.  Results for orders placed or performed in visit on 05/10/24  CBC with Differential (Cancer Center Only)  Result Value Ref Range   WBC Count 12.4 (H) 4.0 - 10.5 K/uL   RBC 5.27 (H) 3.87 - 5.11 MIL/uL   Hemoglobin 14.1 12.0 - 15.0 g/dL   HCT 56.3 63.9 - 53.9 %   MCV 82.7 80.0 - 100.0 fL    MCH 26.8 26.0 - 34.0 pg   MCHC 32.3 30.0 - 36.0 g/dL   RDW 84.8 88.4 - 84.4 %   Platelet Count 266 150 - 400 K/uL   nRBC 0.0 0.0 - 0.2 %   Neutrophils Relative % 82 %   Neutro Abs 10.3 (H) 1.7 - 7.7 K/uL   Lymphocytes Relative 9 %   Lymphs Abs 1.1 0.7 - 4.0 K/uL   Monocytes Relative 5 %   Monocytes Absolute 0.6 0.1 - 1.0 K/uL   Eosinophils Relative 2 %   Eosinophils Absolute 0.2 0.0 - 0.5 K/uL   Basophils Relative 1 %   Basophils Absolute 0.1 0.0 - 0.1 K/uL   Immature Granulocytes 1 %   Abs Immature Granulocytes 0.13 (H) 0.00 - 0.07 K/uL  CMP (Cancer Center only)  Result Value Ref Range   Sodium 134 (L) 135 - 145 mmol/L   Potassium 4.3 3.5 - 5.1 mmol/L   Chloride 95 (L) 98 - 111 mmol/L   CO2 30 22 - 32 mmol/L   Glucose, Bld 144 (H) 70 - 99 mg/dL   BUN 19 8 - 23 mg/dL   Creatinine 8.93 (H) 9.55 - 1.00 mg/dL   Calcium  10.6 (H) 8.9 - 10.3 mg/dL   Total Protein 7.4 6.5 - 8.1 g/dL   Albumin  4.2 3.5 - 5.0 g/dL   AST 16 15 - 41 U/L   ALT 16 0 - 44 U/L   Alkaline Phosphatase 75 38 - 126 U/L   Total Bilirubin 0.4 0.0 - 1.2 mg/dL   GFR, Estimated 55 (L) >60 mL/min   Anion gap 10 5 - 15  Magnesium   Result Value Ref Range   Magnesium  2.0 1.7 - 2.4 mg/dL  Lactate dehydrogenase  Result Value Ref Range   LDH 184 105 - 235 U/L       RADIOGRAPHIC STUDIES:  No recent pertinent imaging available to review  Orders Placed This  Encounter  Procedures   CBC with Differential (Cancer Center Only)    Standing Status:   Future    Expiration Date:   05/10/2025   CMP (Cancer Center only)    Standing Status:   Future    Expiration Date:   05/10/2025   Magnesium     Standing Status:   Future    Expiration Date:   05/10/2025   Lactate dehydrogenase    Standing Status:   Future    Expiration Date:   05/10/2025   Future Appointments  Date Time Provider Department Center  08/09/2024 11:00 AM [facility] PHLEBOTOMIST [facility] None  08/09/2024 11:30 AM Desare Duddy, Chinita, MD [facility] None       This document was completed utilizing speech recognition software. Grammatical errors, random word insertions, pronoun errors, and incomplete sentences are an occasional consequence of this system due to software limitations, ambient noise, and hardware issues. Any formal questions or concerns about the content, text or information contained within the body of this dictation should be directly addressed to the provider for clarification.    "

## 2024-05-21 ENCOUNTER — Other Ambulatory Visit: Payer: Self-pay

## 2024-05-26 ENCOUNTER — Other Ambulatory Visit: Payer: Self-pay

## 2024-05-28 ENCOUNTER — Other Ambulatory Visit: Payer: Self-pay

## 2024-06-01 ENCOUNTER — Other Ambulatory Visit: Payer: Self-pay | Admitting: Pharmacy Technician

## 2024-06-01 ENCOUNTER — Other Ambulatory Visit: Payer: Self-pay

## 2024-06-01 NOTE — Progress Notes (Signed)
 Specialty Pharmacy Refill Coordination Note  MONCIA ANNAS is a 74 y.o. female contacted today regarding refills of specialty medication(s) Vismodegib  (Erivedge )   Patient requested Delivery   Delivery date: 06/03/24   Verified address: [facility] ATTN: Anyi Fels Rm# [id number], [address],   Medication will be filled on: 06/02/24    06/01/2024 Spoke with Lysle (nurse).

## 2024-06-02 ENCOUNTER — Other Ambulatory Visit: Payer: Self-pay

## 2024-08-09 ENCOUNTER — Inpatient Hospital Stay

## 2024-08-09 ENCOUNTER — Inpatient Hospital Stay: Admitting: Oncology
